# Patient Record
Sex: Male | Born: 1955 | Race: White | Hispanic: No | Marital: Single | Smoking: Current every day smoker
Health system: Southern US, Community
[De-identification: ages and names within clinical notes are randomized; demographics above are authoritative.]

## PROBLEM LIST (undated history)

## (undated) DIAGNOSIS — I428 Other cardiomyopathies: Secondary | ICD-10-CM

## (undated) DIAGNOSIS — N289 Disorder of kidney and ureter, unspecified: Secondary | ICD-10-CM

## (undated) DIAGNOSIS — E785 Hyperlipidemia, unspecified: Secondary | ICD-10-CM

## (undated) DIAGNOSIS — N1832 Chronic kidney disease, stage 3b: Secondary | ICD-10-CM

## (undated) DIAGNOSIS — J449 Chronic obstructive pulmonary disease, unspecified: Secondary | ICD-10-CM

## (undated) DIAGNOSIS — I4892 Unspecified atrial flutter: Secondary | ICD-10-CM

## (undated) DIAGNOSIS — K219 Gastro-esophageal reflux disease without esophagitis: Secondary | ICD-10-CM

## (undated) DIAGNOSIS — I10 Essential (primary) hypertension: Secondary | ICD-10-CM

## (undated) DIAGNOSIS — G4730 Sleep apnea, unspecified: Secondary | ICD-10-CM

## (undated) DIAGNOSIS — G4733 Obstructive sleep apnea (adult) (pediatric): Secondary | ICD-10-CM

## (undated) DIAGNOSIS — E119 Type 2 diabetes mellitus without complications: Secondary | ICD-10-CM

## (undated) DIAGNOSIS — I5030 Unspecified diastolic (congestive) heart failure: Secondary | ICD-10-CM

## (undated) DIAGNOSIS — I2510 Atherosclerotic heart disease of native coronary artery without angina pectoris: Secondary | ICD-10-CM

## (undated) DIAGNOSIS — I5042 Chronic combined systolic (congestive) and diastolic (congestive) heart failure: Secondary | ICD-10-CM

## (undated) DIAGNOSIS — N183 Chronic kidney disease, stage 3 unspecified: Secondary | ICD-10-CM

## (undated) DIAGNOSIS — T7840XA Allergy, unspecified, initial encounter: Secondary | ICD-10-CM

## (undated) HISTORY — DX: Gastro-esophageal reflux disease without esophagitis: K21.9

## (undated) HISTORY — DX: Chronic obstructive pulmonary disease, unspecified: J44.9

## (undated) HISTORY — PX: CHOLECYSTECTOMY: SHX55

## (undated) HISTORY — DX: Allergy, unspecified, initial encounter: T78.40XA

## (undated) HISTORY — DX: Sleep apnea, unspecified: G47.30

## (undated) HISTORY — PX: PARATHYROIDECTOMY: SHX19

## (undated) HISTORY — PX: BACK SURGERY: SHX140

## (undated) HISTORY — PX: CARDIAC CATHETERIZATION: SHX172

## (undated) HISTORY — DX: Atherosclerotic heart disease of native coronary artery without angina pectoris: I25.10

## (undated) HISTORY — DX: Type 2 diabetes mellitus without complications: E11.9

## (undated) HISTORY — PX: OTHER SURGICAL HISTORY: SHX169

## (undated) HISTORY — DX: Essential (primary) hypertension: I10

## (undated) HISTORY — DX: Hyperlipidemia, unspecified: E78.5

---

## 2004-03-13 ENCOUNTER — Emergency Department: Payer: Self-pay | Admitting: Emergency Medicine

## 2004-11-11 ENCOUNTER — Emergency Department: Payer: Self-pay | Admitting: Emergency Medicine

## 2004-11-21 ENCOUNTER — Emergency Department: Payer: Self-pay | Admitting: Emergency Medicine

## 2004-11-24 ENCOUNTER — Emergency Department: Payer: Self-pay | Admitting: General Practice

## 2004-11-25 ENCOUNTER — Emergency Department: Payer: Self-pay | Admitting: Unknown Physician Specialty

## 2006-06-24 IMAGING — CR RIGHT ELBOW - 2 VIEW
1 series · 2 of 2 positions shown · non-contrast
Comparison: none

REASON FOR EXAM: Rule out foreign body
COMMENTS:  LMP: (Male)

[Series 1: view not recorded · 0.17mm/px · 2 of 2 slices shown]
[im 1/2]
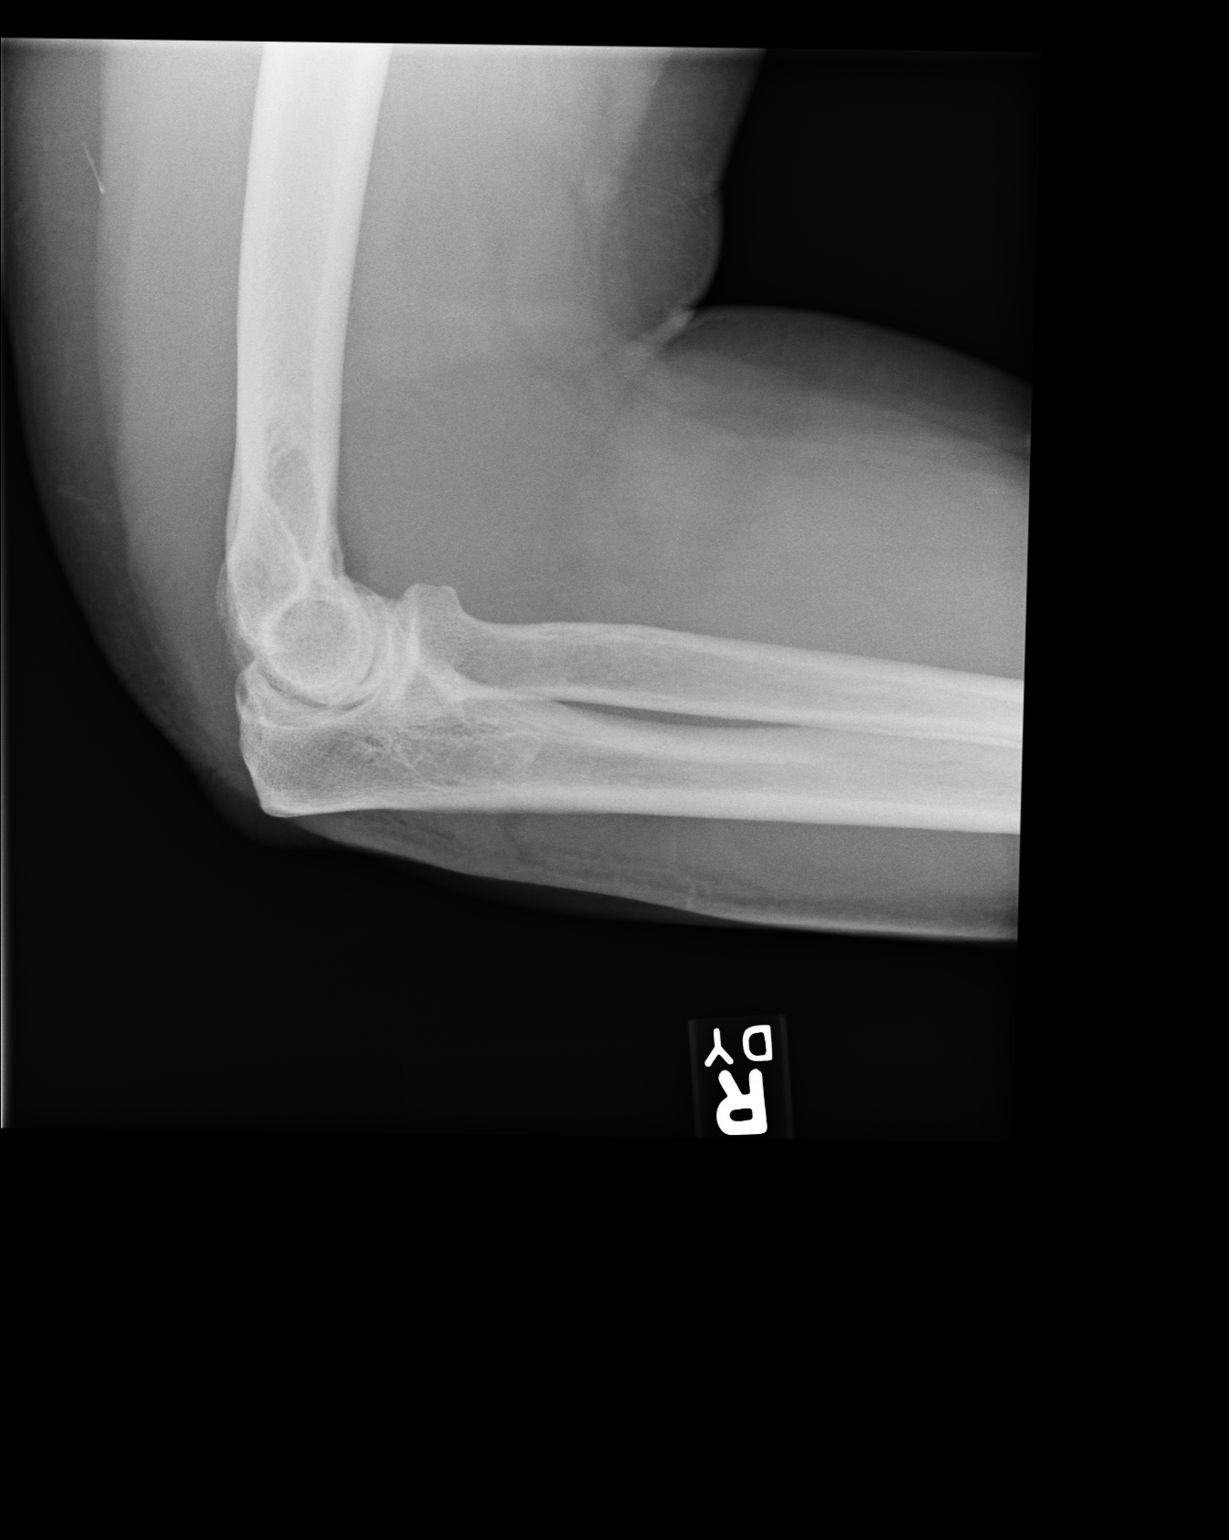
[im 2/2]
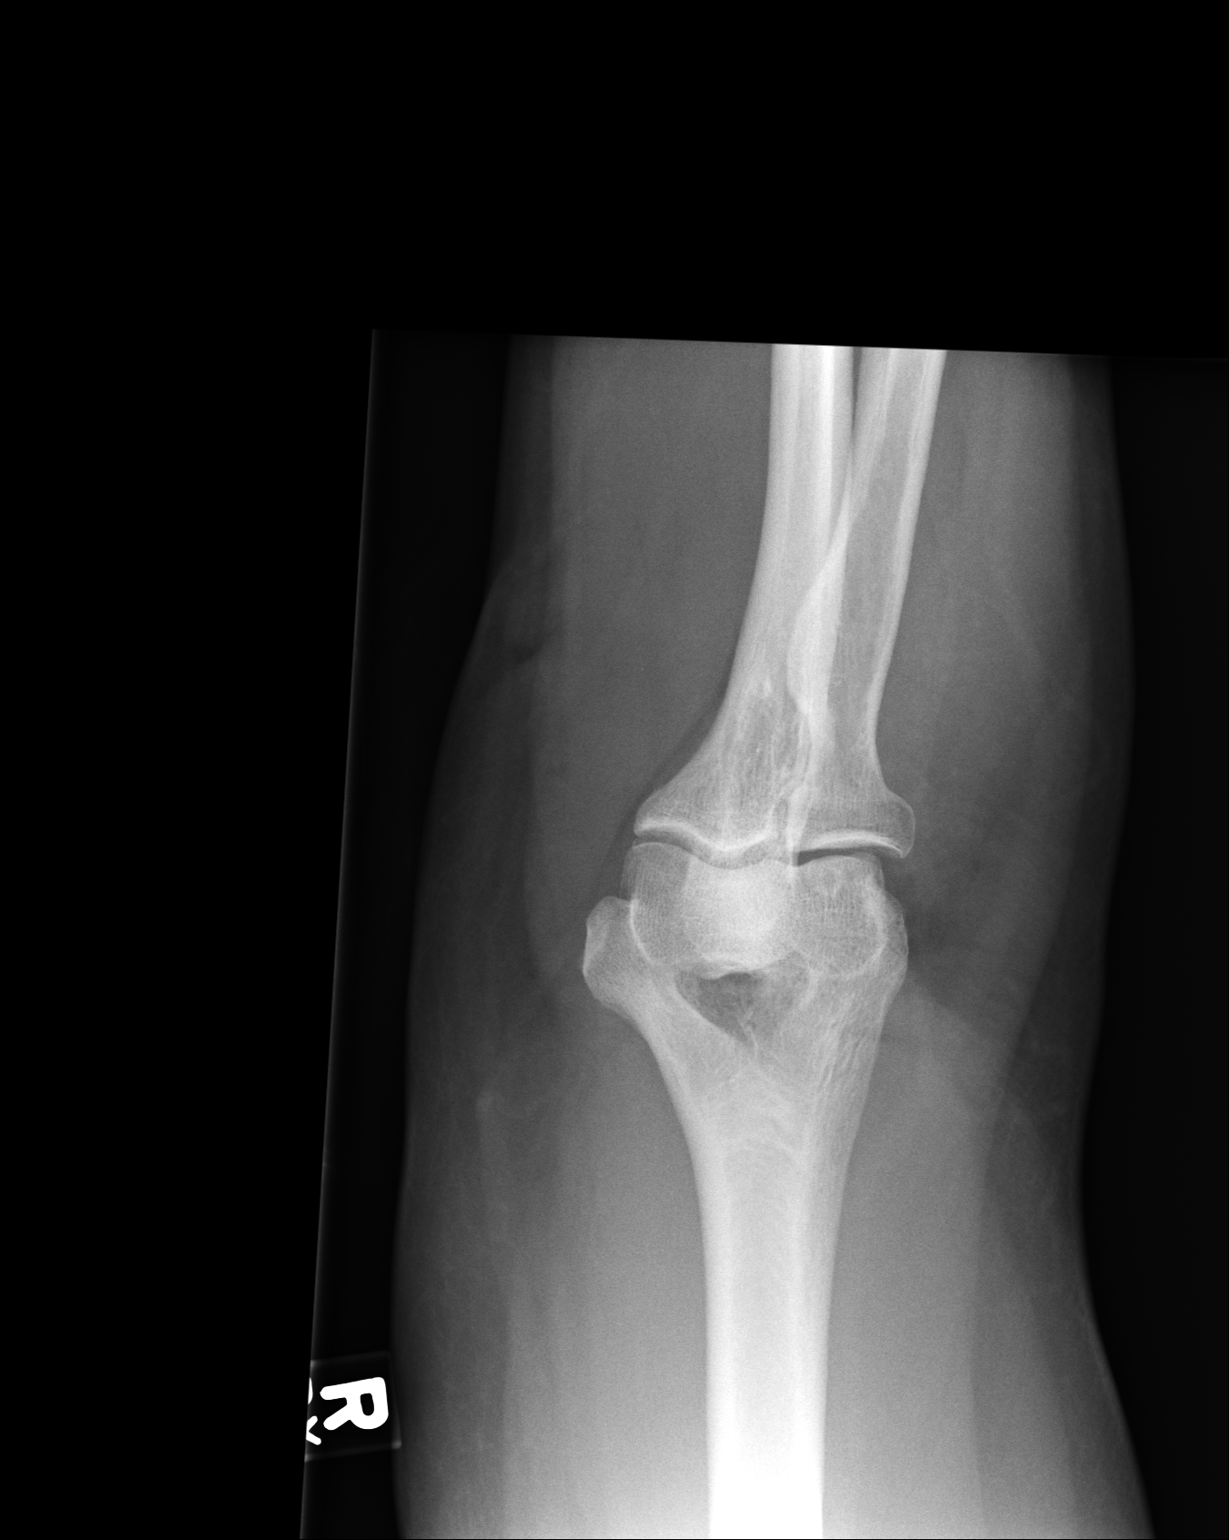

[2 of 2 positions shown; findings below may reference images not displayed]

PROCEDURE:     DXR - DXR ELBOW RT AP AND LATERAL  - November 11, 2004 [DATE]

RESULT:     The patient has sustained a puncture wound in the olecranon
region and is being evaluated for possible retained foreign body.

AP and lateral views of the elbow do not reveal evidence of a foreign body
in the region of the olecranon. No soft tissue gas is seen. There is a
linear density projecting over the soft tissues of the posterior distal
triceps region that is seen only on the lateral film but this is likely
remote from the area of injury. The bony structures are normal in appearance.
IMPRESSION: I do not see evidence of retained metallic foreign body over
the elbow itself.

## 2007-12-24 ENCOUNTER — Emergency Department: Payer: Self-pay | Admitting: Emergency Medicine

## 2008-01-25 ENCOUNTER — Emergency Department: Payer: Self-pay | Admitting: Emergency Medicine

## 2008-02-01 ENCOUNTER — Emergency Department: Payer: Self-pay | Admitting: Emergency Medicine

## 2008-07-15 ENCOUNTER — Ambulatory Visit: Payer: Self-pay | Admitting: Urology

## 2008-07-17 ENCOUNTER — Ambulatory Visit: Payer: Self-pay | Admitting: Urology

## 2009-10-31 ENCOUNTER — Inpatient Hospital Stay: Payer: Self-pay | Admitting: Internal Medicine

## 2010-02-22 ENCOUNTER — Emergency Department: Payer: Self-pay | Admitting: Emergency Medicine

## 2010-02-27 IMAGING — CR DG CHEST 2V
1 series · 2 of 2 positions shown · non-contrast
Comparison: none

REASON FOR EXAM: rcc s/p nephrectomy
COMMENTS:

PROCEDURE:     DXR - DXR CHEST PA (OR AP) AND LATERAL  - July 17, 2008 [DATE]
RESULT:     The lung fields are clear. No pneumonia, pneumothorax or pleural
effusion is seen. Heart size is normal.

[Series 1: view not recorded · 0.17mm/px · 2 of 2 slices shown]
[im 1/2]
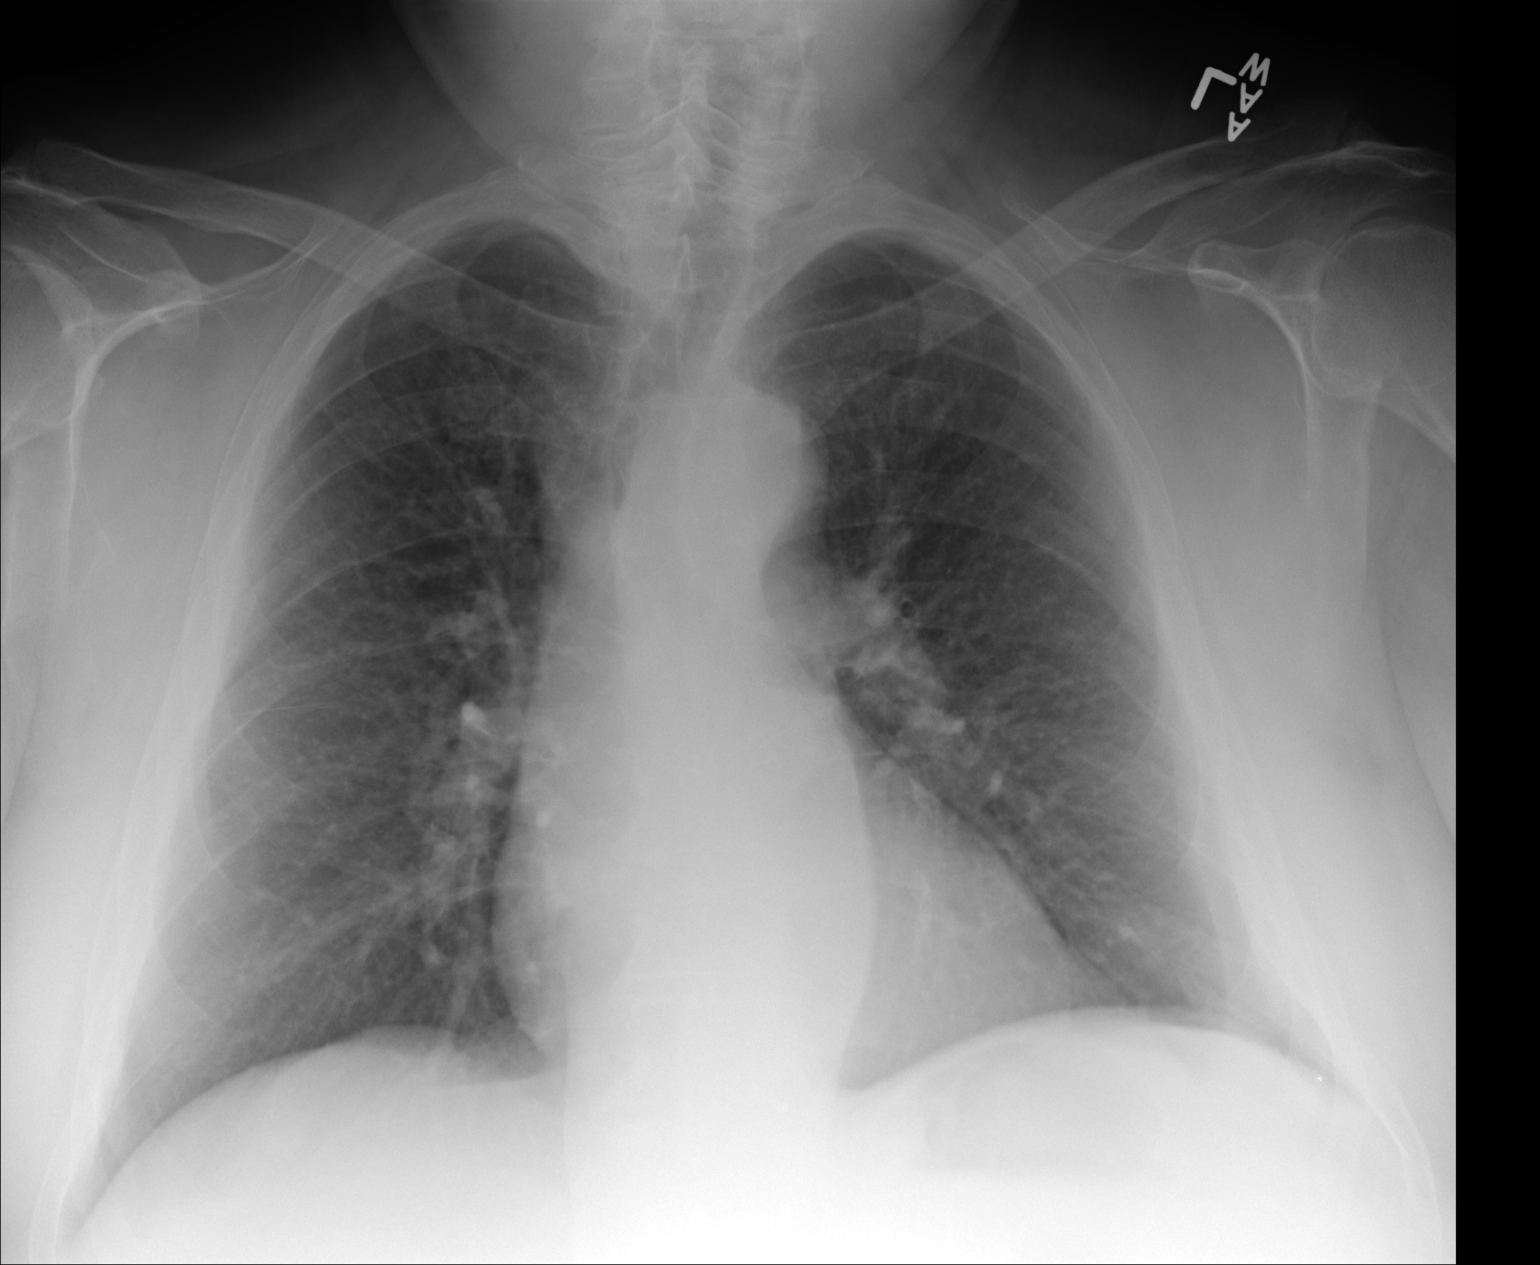
[im 2/2]
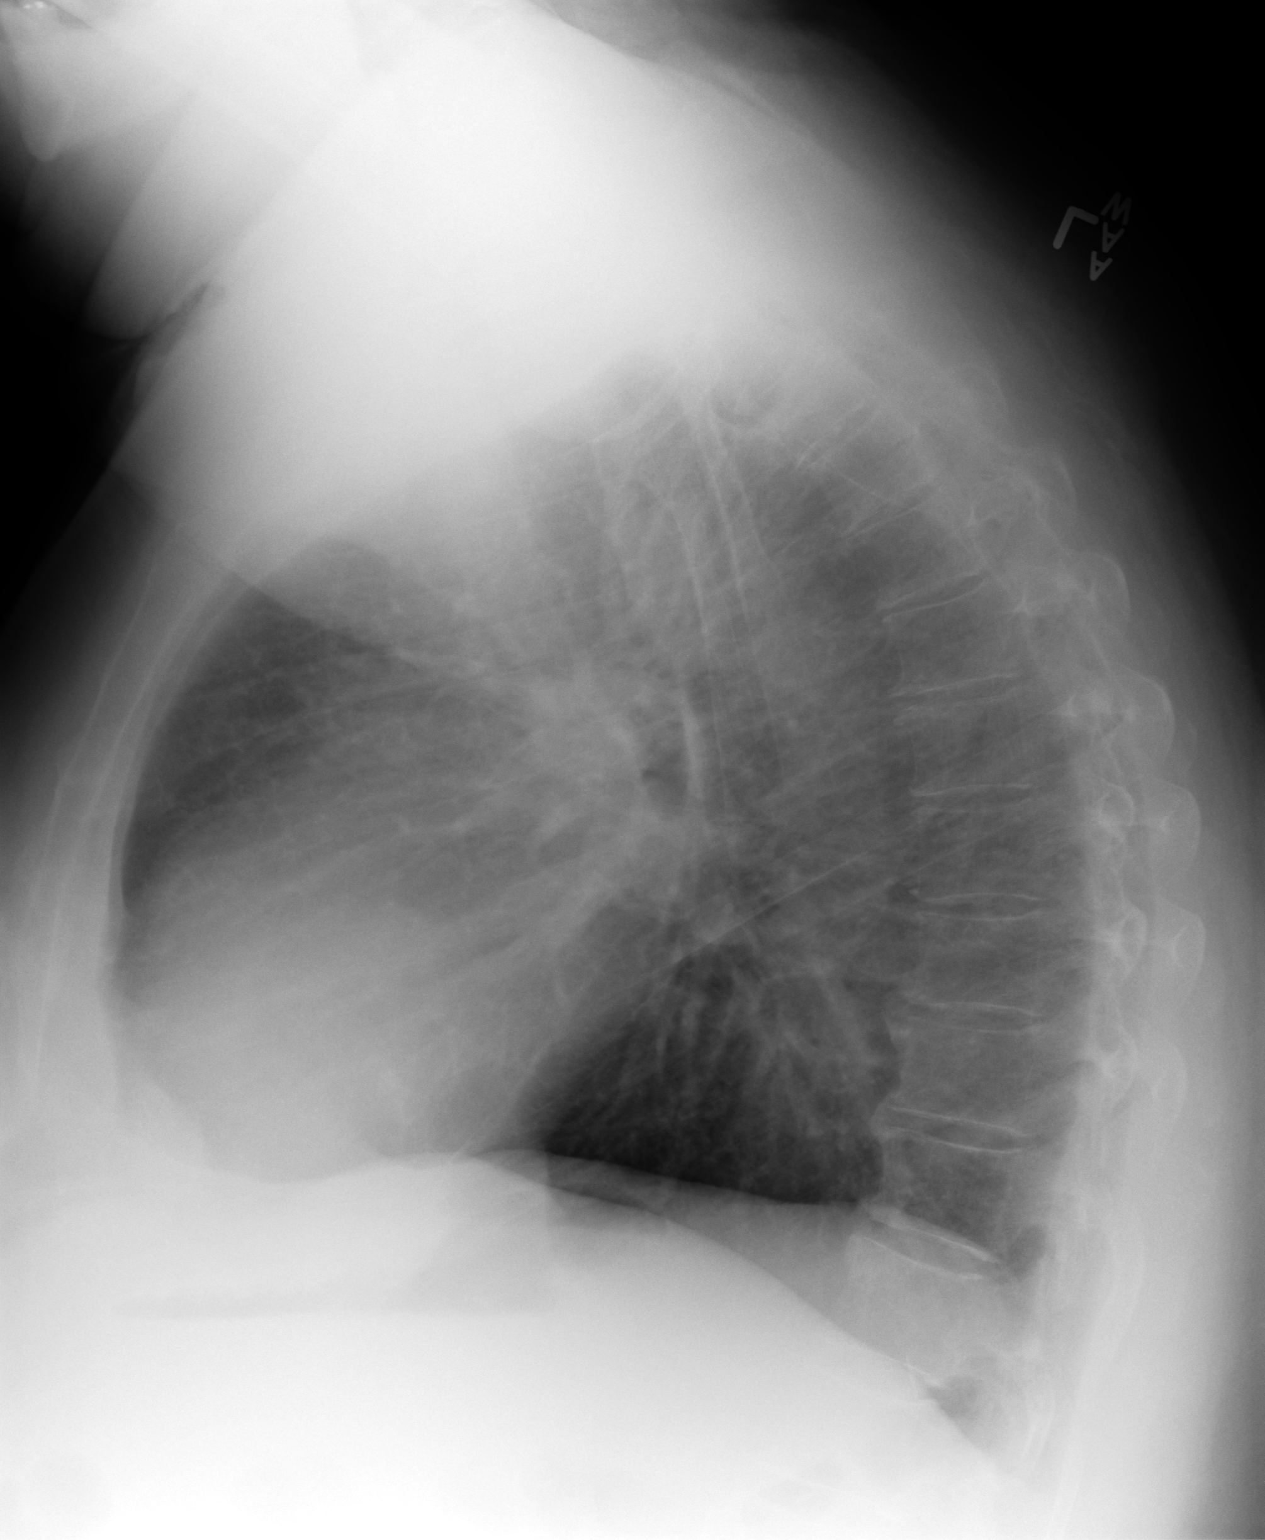

[2 of 2 positions shown; findings below may reference images not displayed]

IMPRESSION: 1.     No acute changes are identified.

## 2010-03-05 ENCOUNTER — Inpatient Hospital Stay: Payer: Self-pay | Admitting: Internal Medicine

## 2010-04-12 ENCOUNTER — Ambulatory Visit: Payer: Self-pay | Admitting: Otolaryngology

## 2010-04-16 ENCOUNTER — Other Ambulatory Visit: Payer: Self-pay | Admitting: Otolaryngology

## 2010-04-27 ENCOUNTER — Ambulatory Visit: Payer: Self-pay | Admitting: Otolaryngology

## 2010-05-03 ENCOUNTER — Ambulatory Visit: Payer: Self-pay | Admitting: Otolaryngology

## 2010-05-04 LAB — PATHOLOGY REPORT

## 2010-05-06 ENCOUNTER — Emergency Department: Payer: Self-pay | Admitting: Emergency Medicine

## 2010-06-26 ENCOUNTER — Emergency Department: Payer: Self-pay | Admitting: Unknown Physician Specialty

## 2010-09-30 ENCOUNTER — Emergency Department: Payer: Self-pay | Admitting: Emergency Medicine

## 2010-10-03 ENCOUNTER — Emergency Department: Payer: Self-pay | Admitting: Emergency Medicine

## 2010-10-22 ENCOUNTER — Emergency Department: Payer: Self-pay | Admitting: Emergency Medicine

## 2011-06-13 IMAGING — CT CT HEAD WITHOUT CONTRAST
2 series · 16 of 30 positions shown, 20 images · non-contrast
Comparison: none

REASON FOR EXAM: numbess on l
COMMENTS:

PROCEDURE:     CT  - CT HEAD WITHOUT CONTRAST  - October 31, 2009  [DATE]
RESULT:     Comparison:  None
TECHNIQUE: Multiple axial images from the foramen magnum to the vertex were
obtained without IV contrast.

[Series 2: without · axial · non-contrast · 0.41mm/px · z∈[-624,-500]mm · 13 of 31 slices shown, 17 images]
[im 3/31  brain]
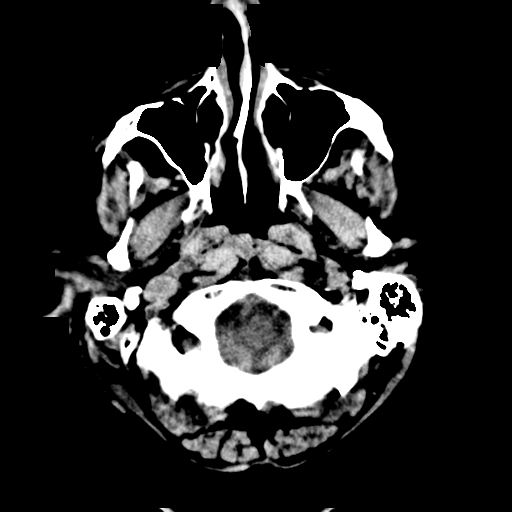
[im 3/31  bone]
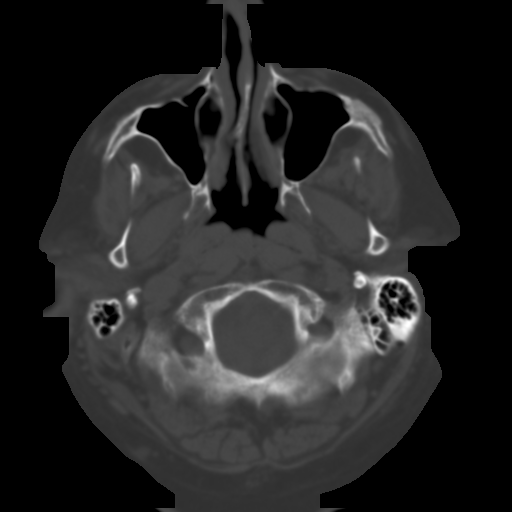
[im 5/31  brain]
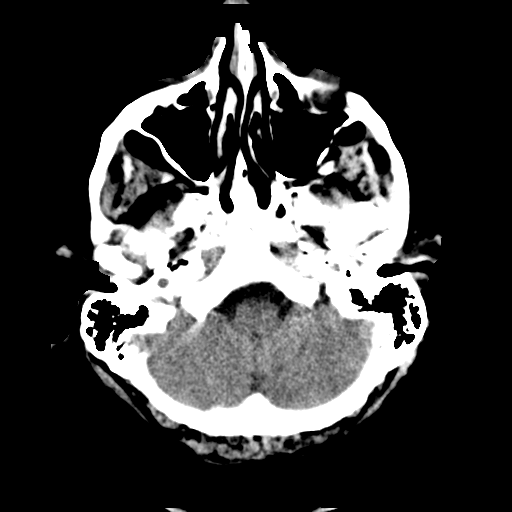
[im 7/31  brain]
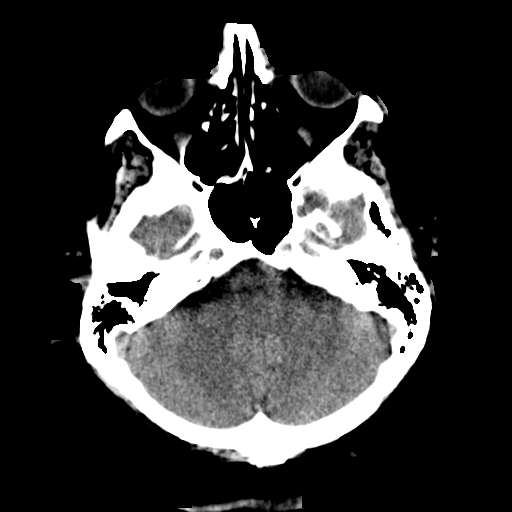
[im 9/31  brain]
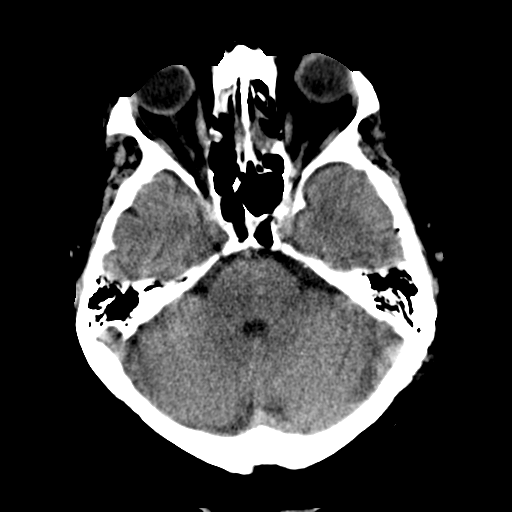
[im 11/31  brain]
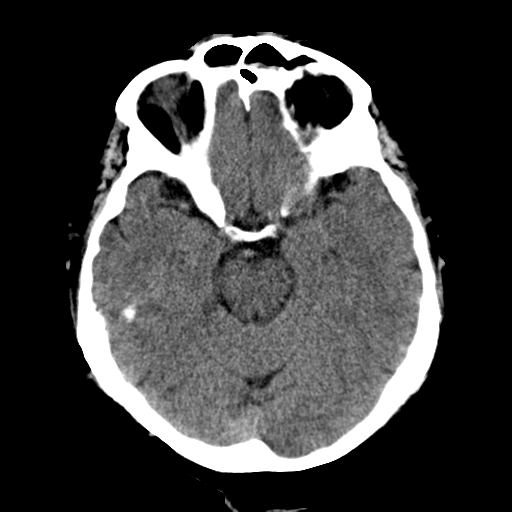
[im 11/31  bone]
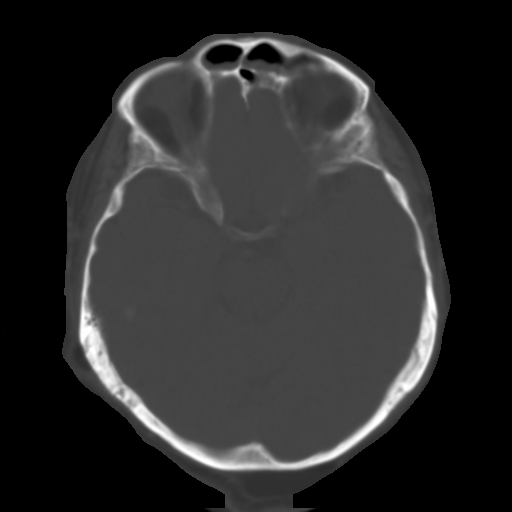
[im 13/31  brain]
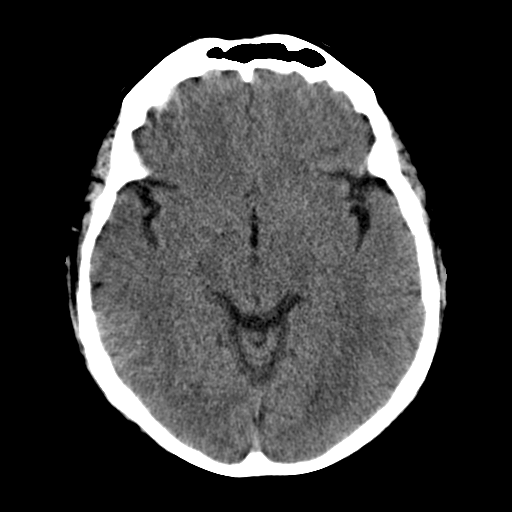
[im 16/31  brain]
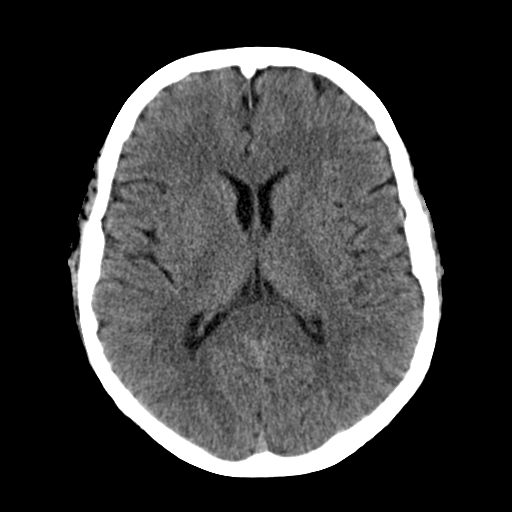
[im 18/31  brain]
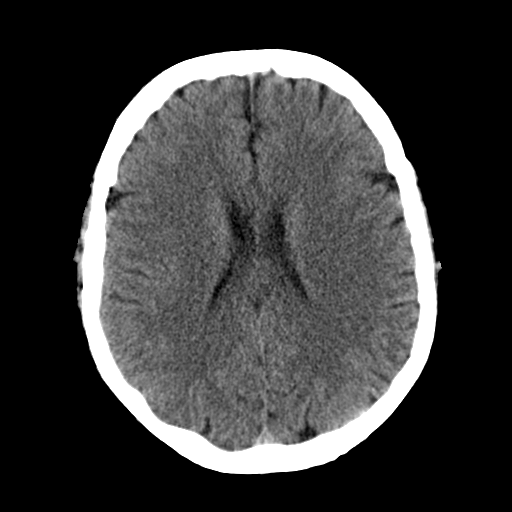
[im 20/31  brain]
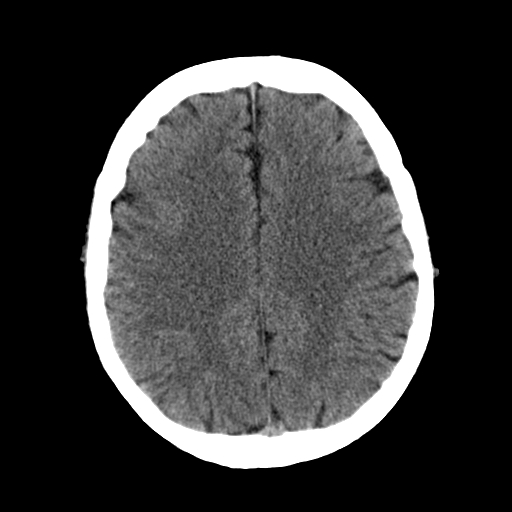
[im 20/31  bone]
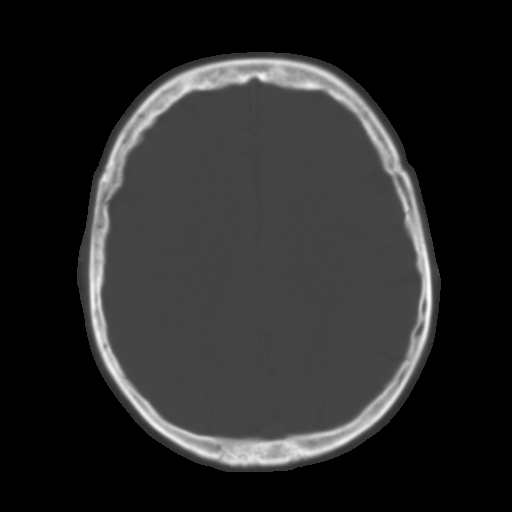
[im 22/31  brain]
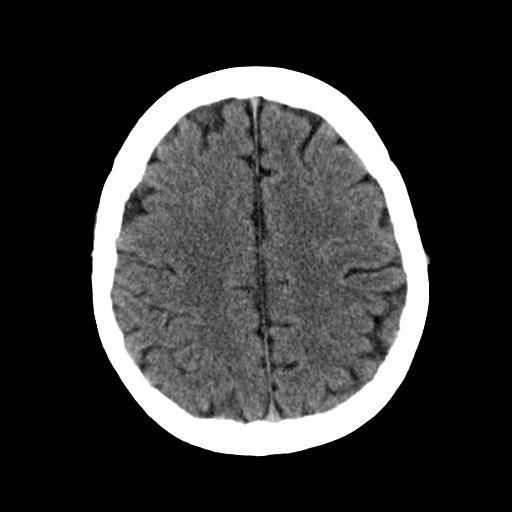
[im 24/31  brain]
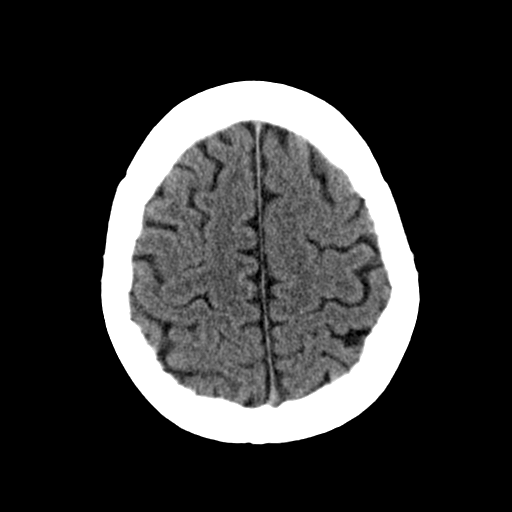
[im 26/31  brain]
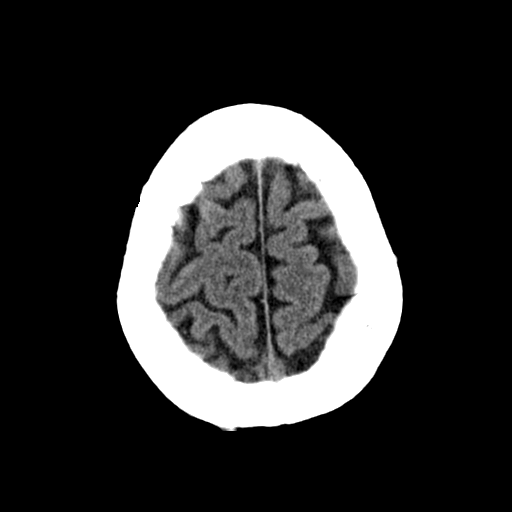
[im 28/31  brain]
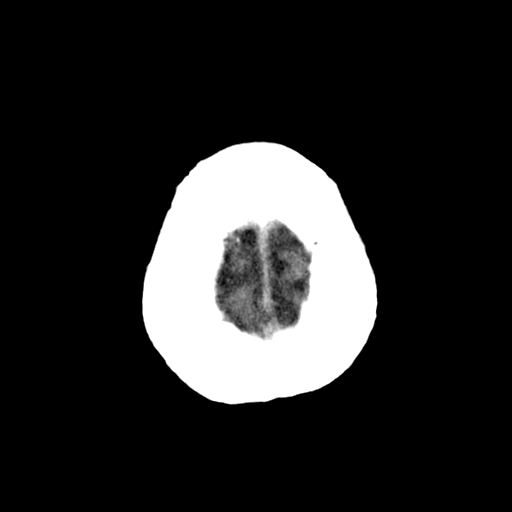
[im 28/31  bone]
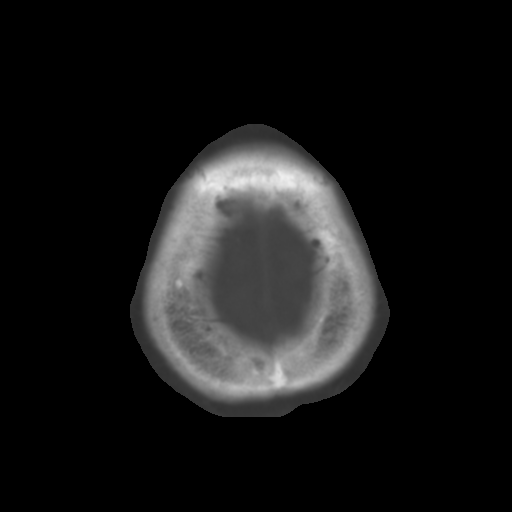

[Series 3: bone · axial · 0.41mm/px · z∈[-624,-584]mm · 3 of 31 slices shown]
[im 3/31  bone]
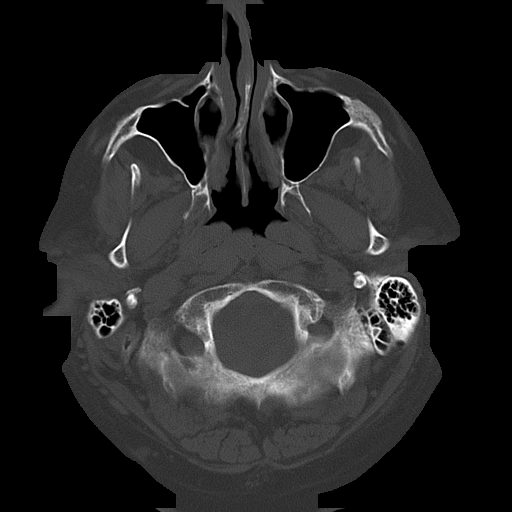
[im 7/31  bone]
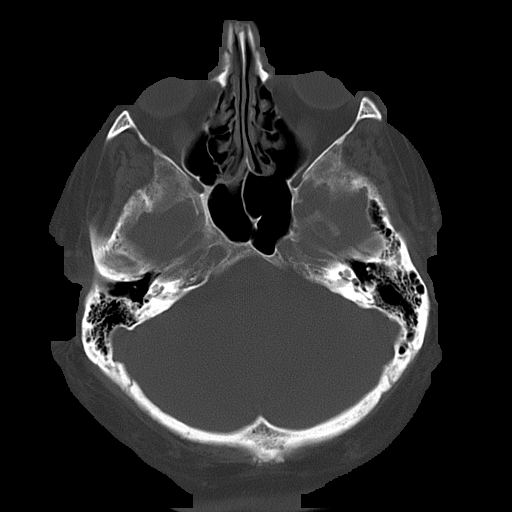
[im 11/31  bone]
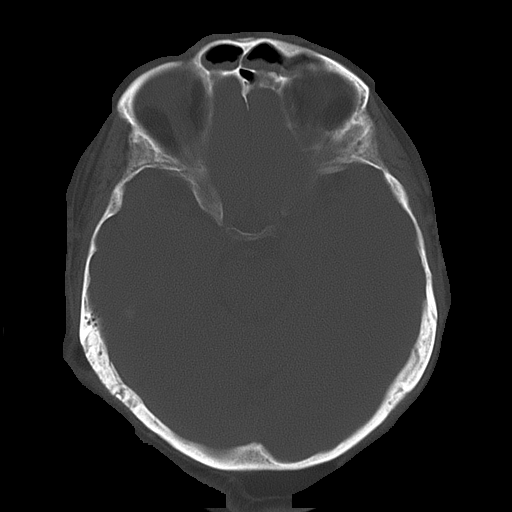

[16 of 30 positions shown; findings below may reference images not displayed]

FINDINGS: There is no evidence of mass effect, midline shift, or extra-axial fluid
collections.  There is no evidence of a space-occupying lesion or
intracranial hemorrhage. There is no evidence of a cortical-based area of
acute infarction.

The ventricles and sulci are appropriate for the patient's age. The basal
cisterns are patent.

Visualized portions of the orbits are unremarkable. The visualized portions
of the paranasal sinuses and mastoid air cells are unremarkable.

The osseous structures are unremarkable.
IMPRESSION: No acute intracranial process.

## 2011-06-14 IMAGING — MR MRI HEAD WITHOUT CONTRAST
5 of 7 series · 33 of 48 positions shown · non-contrast
Comparison: None

REASON FOR EXAM: CVA
COMMENTS:

PROCEDURE:     MR  - MR BRAIN WO CONTRAST  - November 01, 2009 [DATE]
RESULT:     History: CVA, dizziness, migraines
TECHNIQUE: Multiplanar, multisequence MRI of the brain was obtained without
IV contrast.

[Series 2: T1 · axial · 10.0mm · 0.56mm/px · z∈[+0,+103]mm · 5 of 24 slices shown]
[im 1/24]
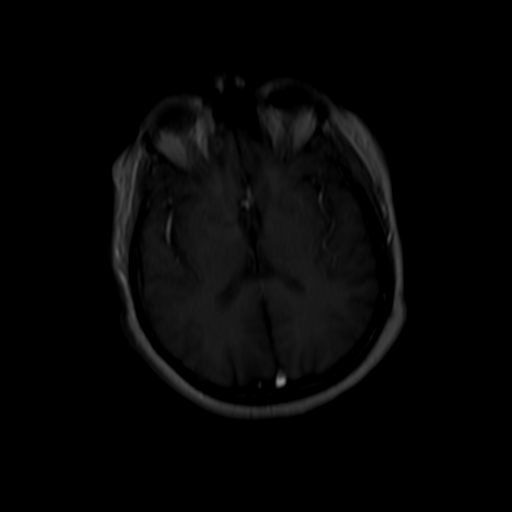
[im 5/24]
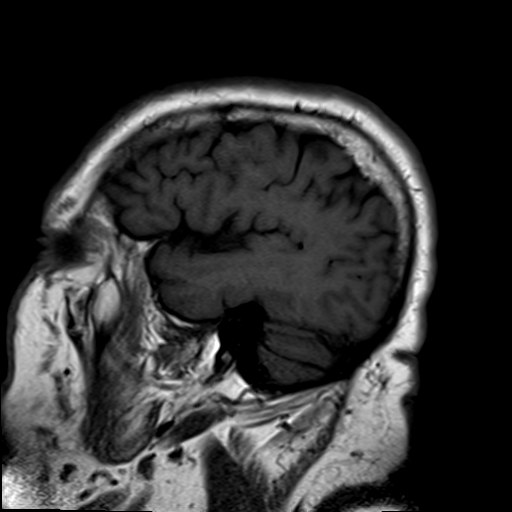
[im 10/24]
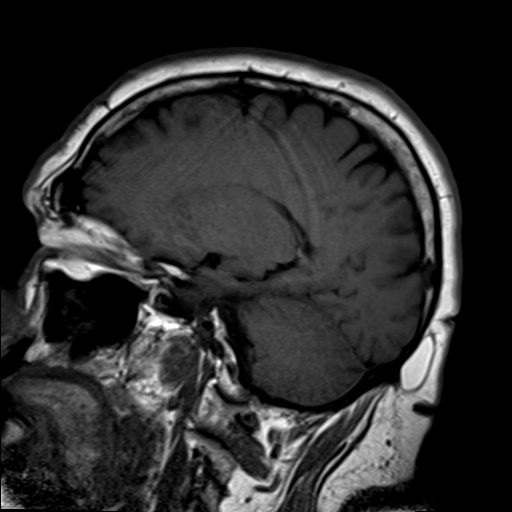
[im 14/24]
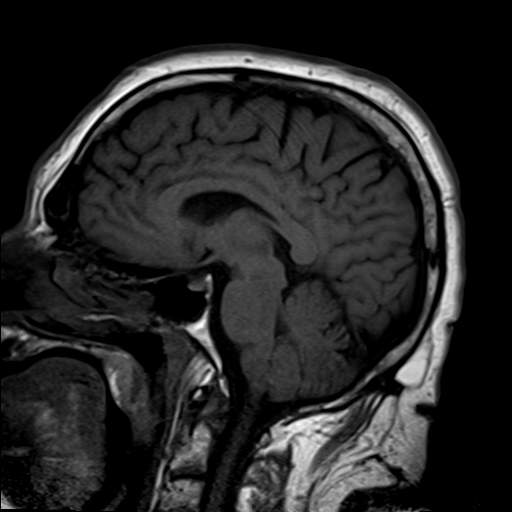
[im 19/24]
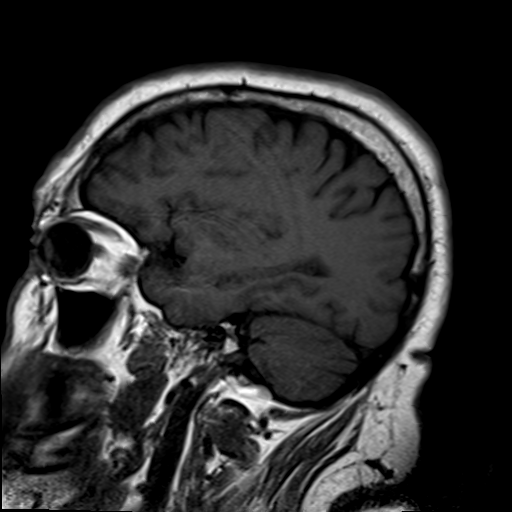

[Series 6: T2 · axial · 5.0mm · 0.60mm/px · z∈[-28,+149]mm · 7 of 24 slices shown]
[im 1/24]
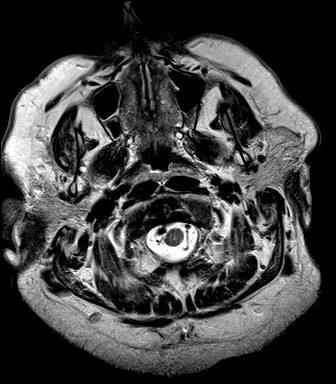
[im 4/24]
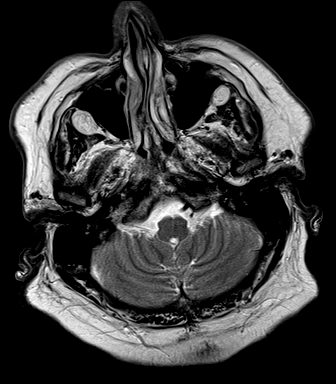
[im 8/24]
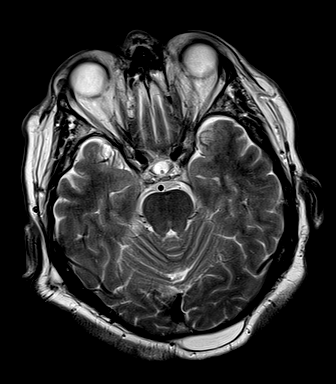
[im 12/24]
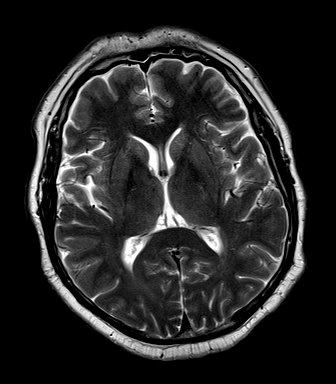
[im 16/24]
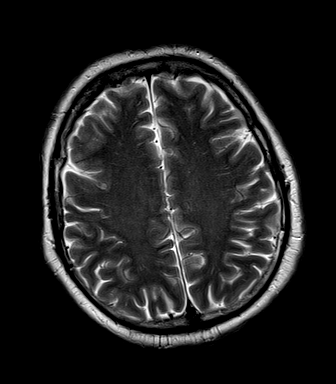
[im 20/24]
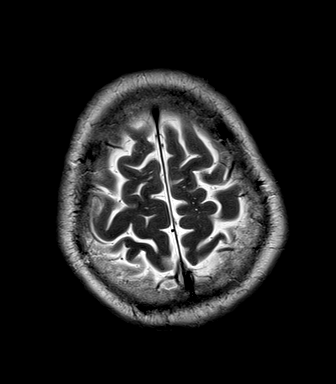
[im 24/24]
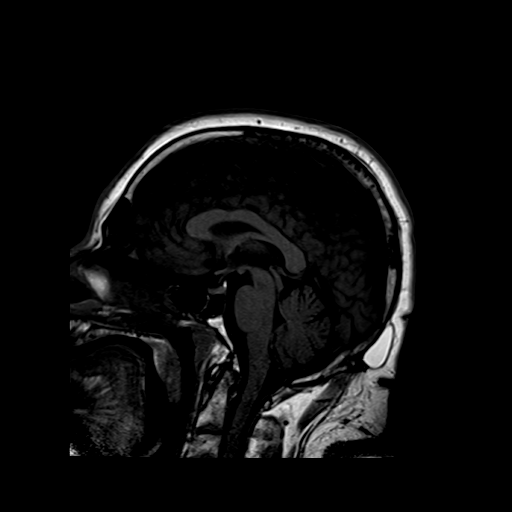

[Series 7: FLAIR · axial · 5.0mm · 0.45mm/px · z∈[-27,+149]mm · 7 of 24 slices shown]
[im 1/24]
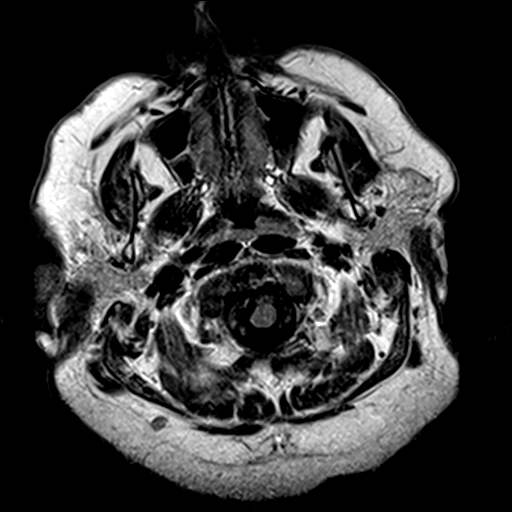
[im 4/24]
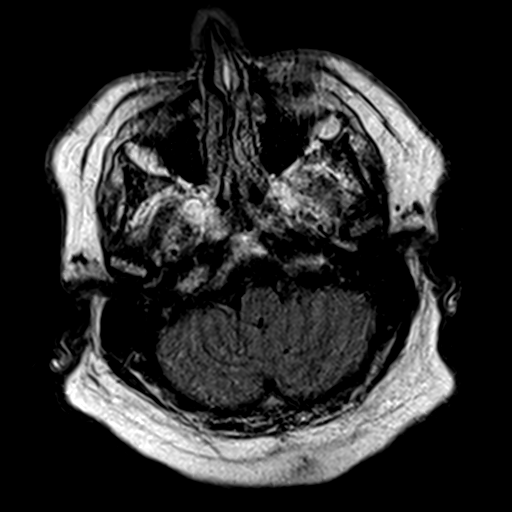
[im 8/24]
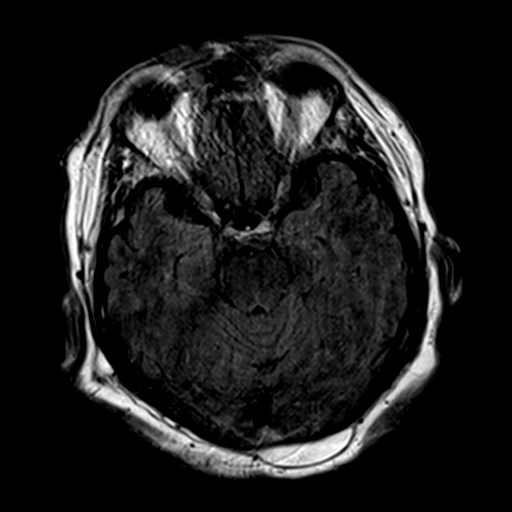
[im 12/24]
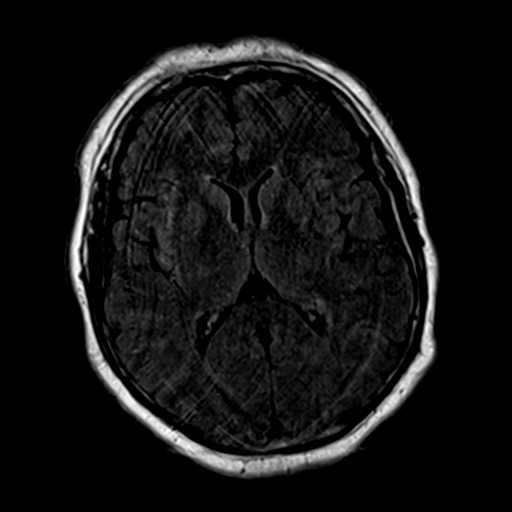
[im 16/24]
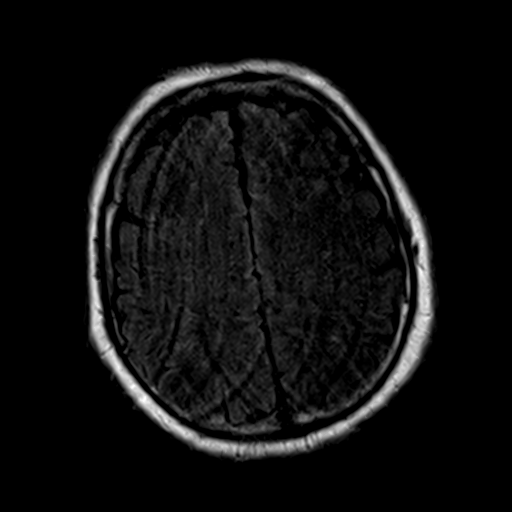
[im 20/24]
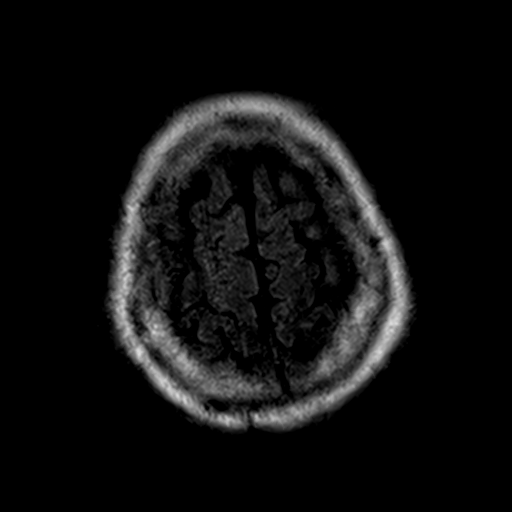
[im 24/24]
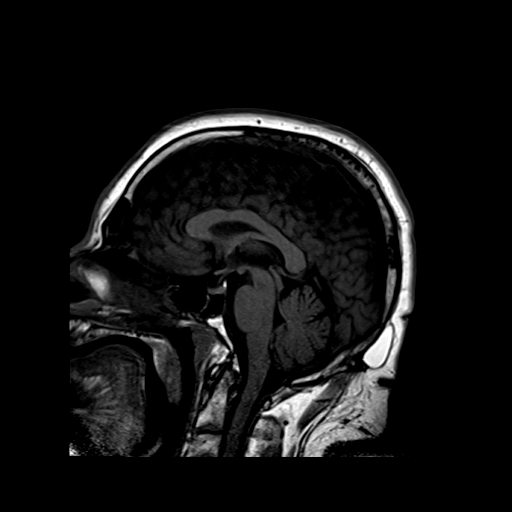

[Series 5002: DWI · axial · 5.0mm · 1.80mm/px · z∈[-27,+149]mm · 7 of 24 slices shown]
[im 1/24]
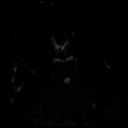
[im 4/24]
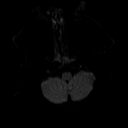
[im 8/24]
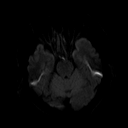
[im 12/24]
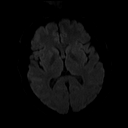
[im 16/24]
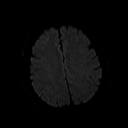
[im 20/24]
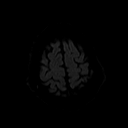
[im 24/24]
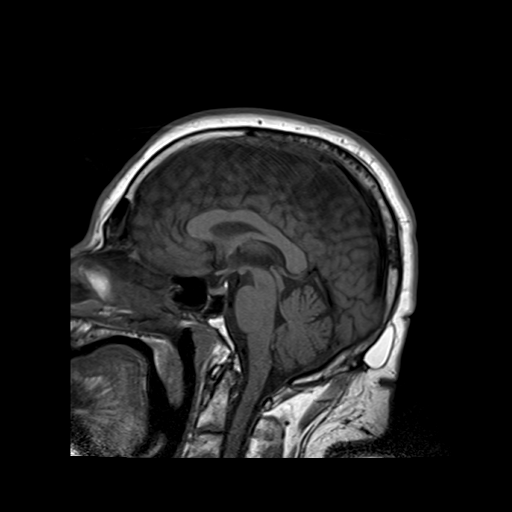

[Series 5003: ADC · axial · 5.0mm · 1.80mm/px · z∈[-27,+149]mm · 7 of 24 slices shown]
[im 1/24]
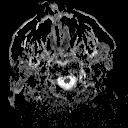
[im 4/24]
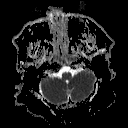
[im 8/24]
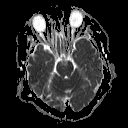
[im 12/24]
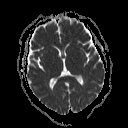
[im 16/24]
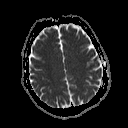
[im 20/24]
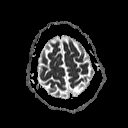
[im 24/24]
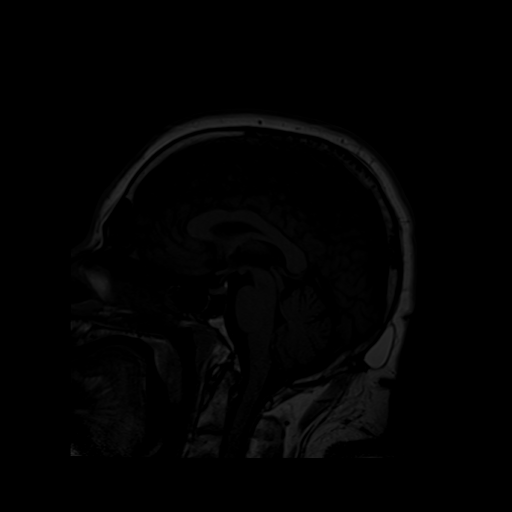

[33 of 48 positions shown; findings below may reference images not displayed]

FINDINGS: Examination is somewhat limited secondary to patient motion degrading image
quality.

There is no acute infarct. There is no hemorrhage. There is no pathologic
extra-axial fluid collection. There is no hydrocephalus. The ventricles are
normal for age. The basal cisterns are patent. There are no abnormal
extra-axial fluid collections.

There is mild bilateral ethmoid sinus mucosal thickening. There is bilateral
frontal sinus because of thickening. The mastoid sinuses are clear. The
skull base and calvarium demonstrate normal signal. The major intracranial
flow voids, including dural venous sinuses appear patent.
IMPRESSION: No acute intracranial pathology.

## 2011-08-10 ENCOUNTER — Emergency Department: Payer: Self-pay | Admitting: Emergency Medicine

## 2011-08-10 LAB — TROPONIN I
Troponin-I: <0.02 ng/mL
Troponin-I: <0.02 ng/mL

## 2011-08-10 LAB — CBC
HCT: 42.2 % (ref 40.0–52.0)
HGB: 14.6 g/dL (ref 13.0–18.0)
MCH: 30.8 pg (ref 26.0–34.0)
MCHC: 34.6 g/dL (ref 32.0–36.0)
MCV: 89 fL (ref 80–100)
Platelet: 135 10*3/uL — ABNORMAL LOW (ref 150–440)
RDW: 12.8 % (ref 11.5–14.5)
WBC: 4.8 10*3/uL (ref 3.8–10.6)

## 2011-08-10 LAB — PROTIME-INR: INR: 0.9

## 2011-08-10 LAB — COMPREHENSIVE METABOLIC PANEL
Alkaline Phosphatase: 63 U/L (ref 50–136)
Anion Gap: 15 (ref 7–16)
BUN: 15 mg/dL (ref 7–18)
Chloride: 101 mmol/L (ref 98–107)
Co2: 19 mmol/L — ABNORMAL LOW (ref 21–32)
EGFR (African American): >60
EGFR (Non-African Amer.): >60
SGOT(AST): 35 U/L (ref 15–37)
SGPT (ALT): 35 U/L
Sodium: 135 mmol/L — ABNORMAL LOW (ref 136–145)

## 2011-08-10 LAB — CK TOTAL AND CKMB (NOT AT ARMC)
CK, Total: 239 U/L — ABNORMAL HIGH (ref 35–232)
CK, Total: 241 U/L — ABNORMAL HIGH (ref 35–232)
CK-MB: 1.4 ng/mL (ref 0.5–3.6)

## 2011-08-10 LAB — PRO B NATRIURETIC PEPTIDE: B-Type Natriuretic Peptide: 12 pg/mL (ref 0–125)

## 2011-08-10 LAB — APTT: Activated PTT: 33 secs (ref 23.6–35.9)

## 2011-09-04 ENCOUNTER — Inpatient Hospital Stay: Payer: Self-pay | Admitting: Surgery

## 2011-09-04 LAB — CBC
HGB: 14.3 g/dL (ref 13.0–18.0)
MCHC: 34.2 g/dL (ref 32.0–36.0)
MCV: 90 fL (ref 80–100)
Platelet: 160 10*3/uL (ref 150–440)
RBC: 4.65 10*6/uL (ref 4.40–5.90)
RDW: 13.5 % (ref 11.5–14.5)

## 2011-09-04 LAB — COMPREHENSIVE METABOLIC PANEL
Albumin: 3.3 g/dL — ABNORMAL LOW (ref 3.4–5.0)
Anion Gap: 13 (ref 7–16)
BUN: 21 mg/dL — ABNORMAL HIGH (ref 7–18)
Bilirubin,Total: 0.3 mg/dL (ref 0.2–1.0)
Chloride: 103 mmol/L (ref 98–107)
Co2: 24 mmol/L (ref 21–32)
EGFR (African American): >60
EGFR (Non-African Amer.): >60
Glucose: 386 mg/dL — ABNORMAL HIGH (ref 65–99)
Osmolality: 298 (ref 275–301)
Sodium: 140 mmol/L (ref 136–145)

## 2011-09-04 LAB — TROPONIN I: Troponin-I: <0.02 ng/mL

## 2011-09-04 LAB — PROTIME-INR: INR: 0.8

## 2011-09-05 LAB — CBC WITH DIFFERENTIAL/PLATELET
Basophil #: 0 10*3/uL (ref 0.0–0.1)
Basophil %: 0.5 %
HCT: 39 % — ABNORMAL LOW (ref 40.0–52.0)
Lymphocyte #: 1.6 10*3/uL (ref 1.0–3.6)
MCHC: 32.8 g/dL (ref 32.0–36.0)
Monocyte #: 0.5 10*3/uL (ref 0.0–0.7)
Neutrophil #: 7.2 10*3/uL — ABNORMAL HIGH (ref 1.4–6.5)
RBC: 4.35 10*6/uL — ABNORMAL LOW (ref 4.40–5.90)
RDW: 12.4 % (ref 11.5–14.5)
WBC: 9.6 10*3/uL (ref 3.8–10.6)

## 2011-09-05 LAB — BASIC METABOLIC PANEL
Anion Gap: 11 (ref 7–16)
BUN: 14 mg/dL (ref 7–18)
Calcium, Total: 8.7 mg/dL (ref 8.5–10.1)
Chloride: 101 mmol/L (ref 98–107)
Co2: 25 mmol/L (ref 21–32)
Creatinine: 1.05 mg/dL (ref 0.60–1.30)
EGFR (Non-African Amer.): >60
Sodium: 137 mmol/L (ref 136–145)

## 2011-09-05 LAB — HEMOGLOBIN A1C: Hemoglobin A1C: 11.1 % — ABNORMAL HIGH (ref 4.2–6.3)

## 2011-09-06 LAB — LIPID PANEL
Cholesterol: 148 mg/dL (ref 0–200)
HDL Cholesterol: 36 mg/dL — ABNORMAL LOW (ref 40–60)

## 2011-09-14 LAB — WOUND CULTURE

## 2011-10-05 IMAGING — US THYROID ULTRASOUND
1 series · 17 of 17 positions shown · non-contrast
Comparison: none

REASON FOR EXAM: right nuchal mass
COMMENTS:   LMP: (Male)

[Series 1: thyroid ultrasound · 17 of 17 slices shown]
[im 1/17]
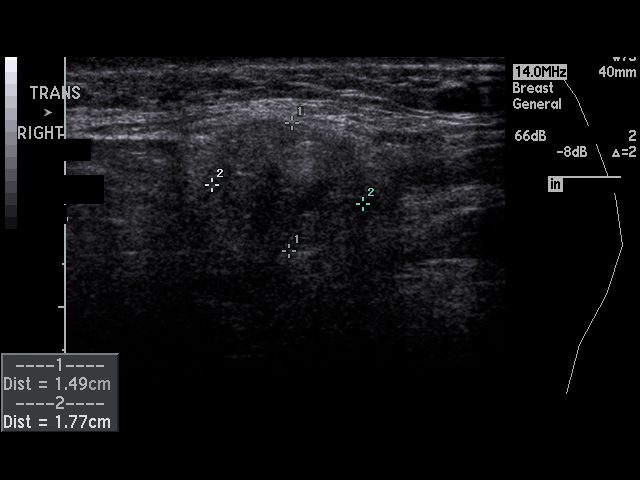
[im 2/17]
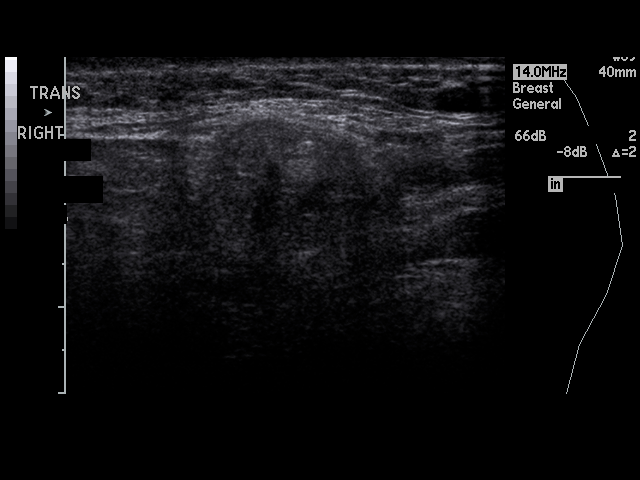
[im 3/17]
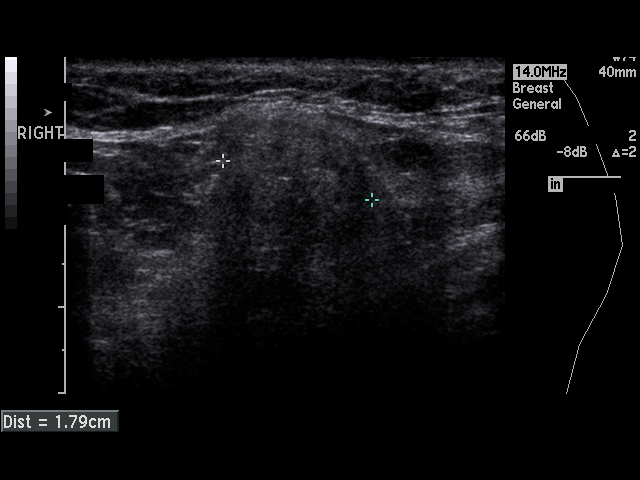
[im 4/17]
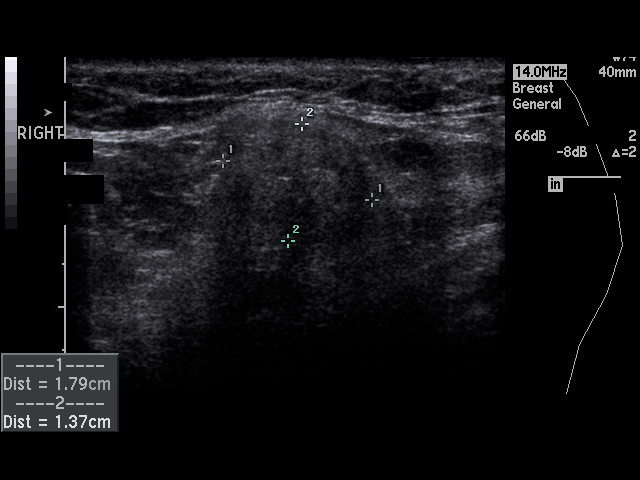
[im 5/17]
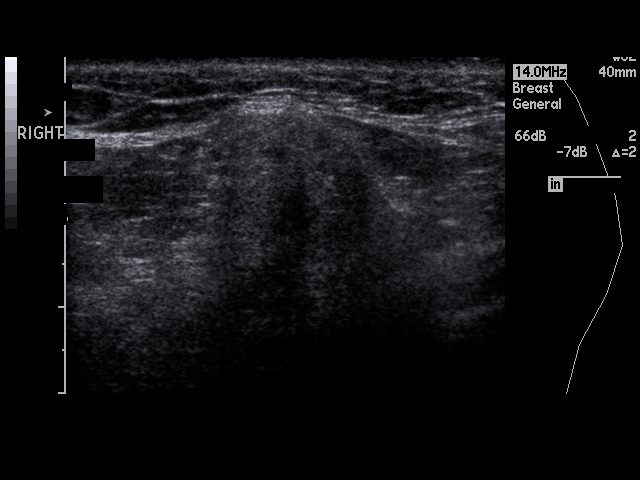
[im 6/17]
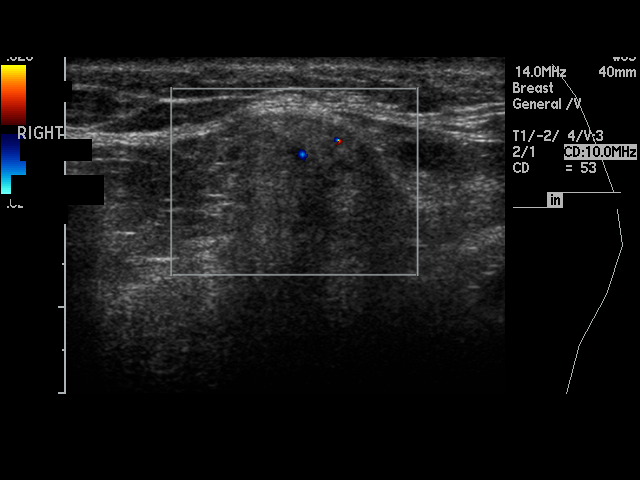
[im 7/17]
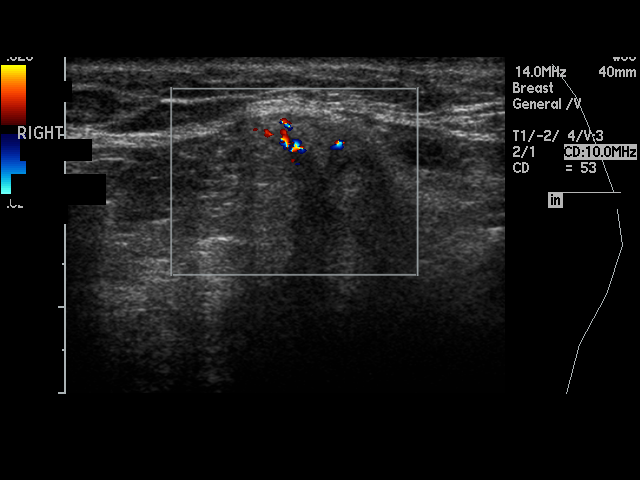
[im 8/17]
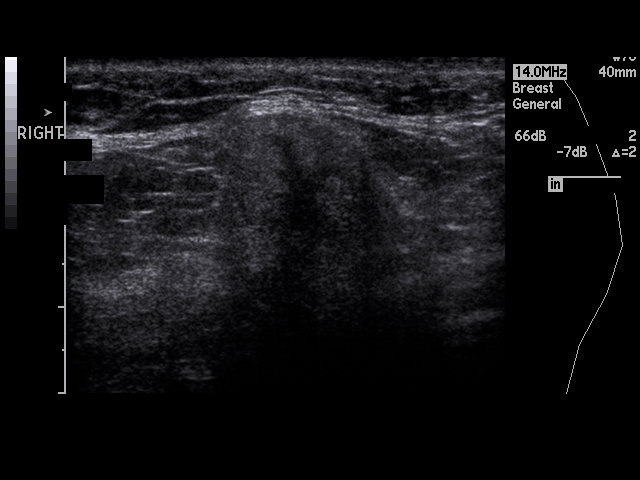
[im 9/17]
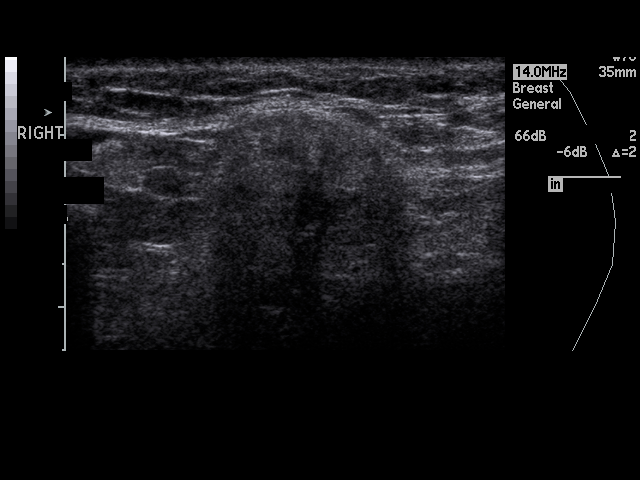
[im 10/17]
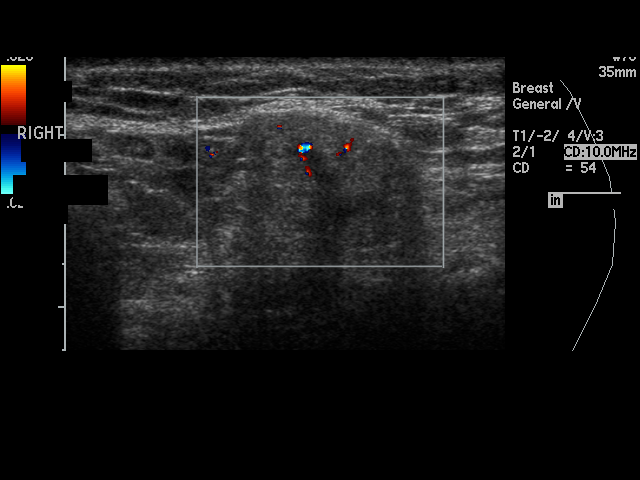
[im 11/17]
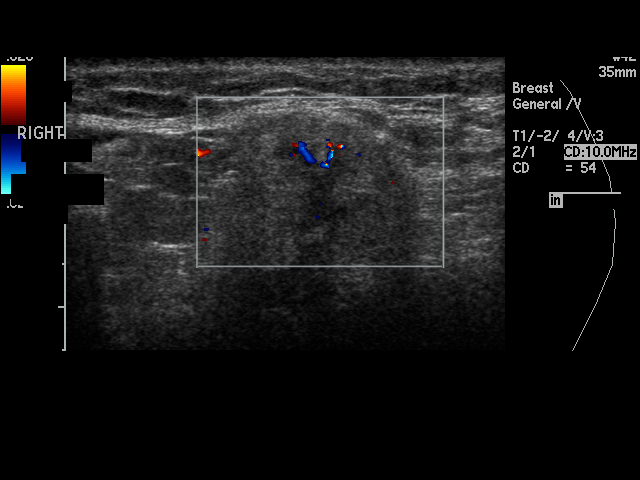
[im 12/17]
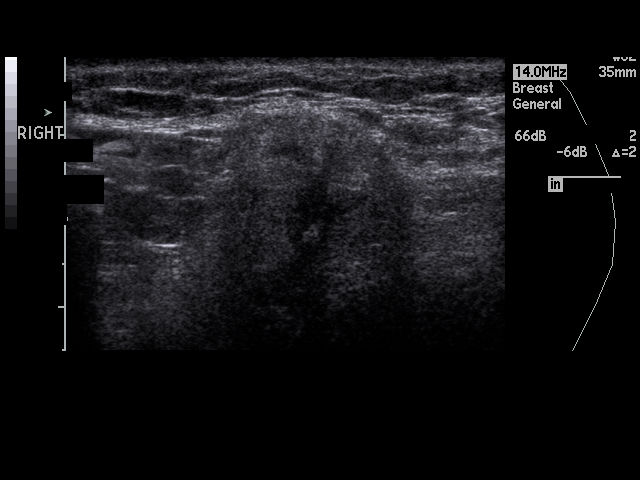
[im 13/17]
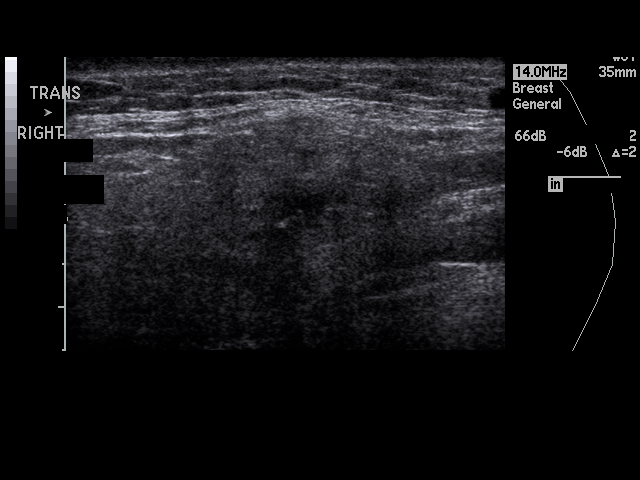
[im 14/17]
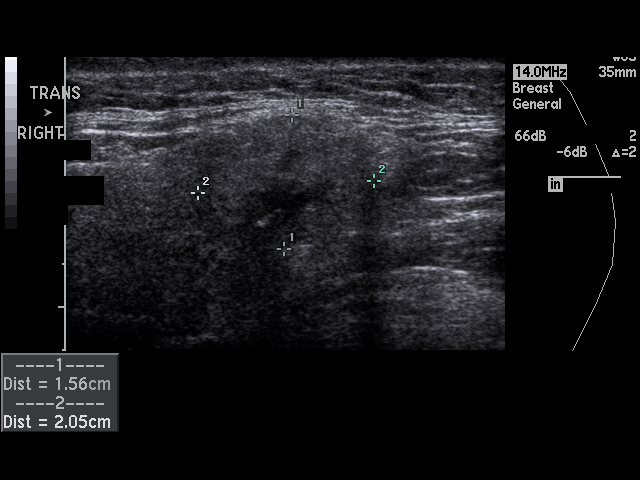
[im 15/17]
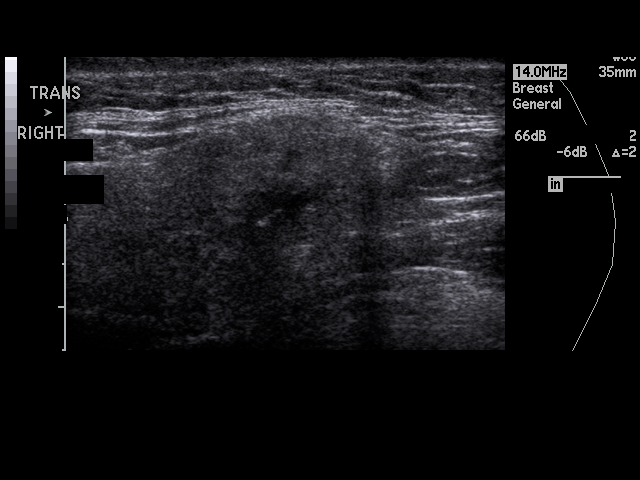
[im 16/17]
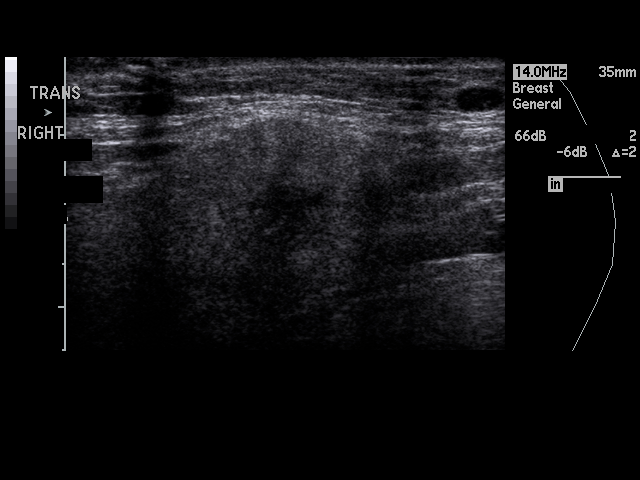
[im 17/17]
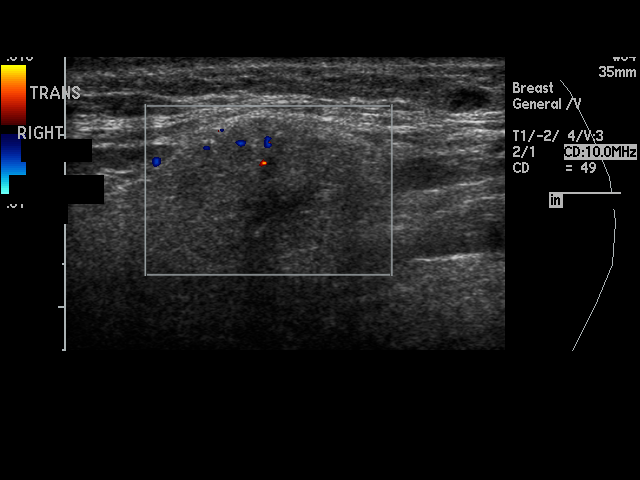

[17 of 17 positions shown; findings below may reference images not displayed]

PROCEDURE:     US  - US SOFT TISSUE HEAD/NECK  - February 22, 2010 [DATE]

RESULT:     Comparison: None.

Technique and Findings:
Multiple grayscale and color Doppler images were obtained in the region of
the right jawline at the area of palpable concern. In this region, there is
a heterogeneous mass. It measures 1.8 x 1.6 x 2.1 cm. There is some color
Doppler flow associated within the mass, suggesting vascularity.
IMPRESSION: Heterogeneous mass in the region of palpable concern along the right
jawline. This is nonspecific and differential includes benign and malignant
etiologies. Recommend close clinical followup to ensure resolution. If this
mass is persistent, consider evaluation with cross-sectional imaging.

## 2011-10-06 IMAGING — CT CT HEAD WITHOUT CONTRAST
2 series · 16 of 30 positions shown, 20 images · non-contrast
Comparison: none

REASON FOR EXAM: Headache, Vertigo, and Blurry vision
COMMENTS:   LMP: (Male)

PROCEDURE:     CT  - CT HEAD WITHOUT CONTRAST  - February 23, 2010  [DATE]
RESULT:     Comparison:  10/31/2009
TECHNIQUE: Multiple axial images from the foramen magnum to the vertex were
obtained without IV contrast.

[Series 2: without · axial · non-contrast · 0.45mm/px · z∈[-592,-462]mm · 13 of 32 slices shown, 17 images]
[im 3/32  brain]
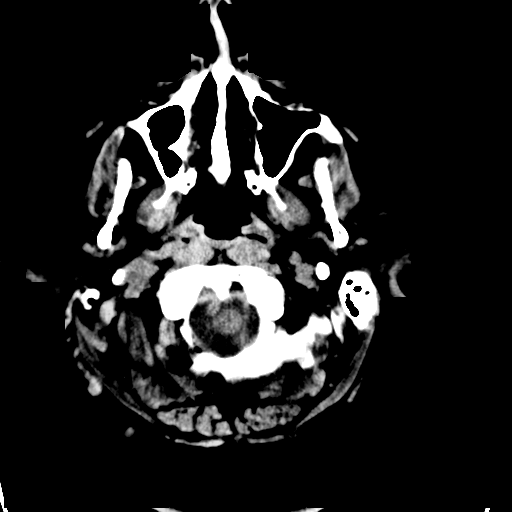
[im 3/32  bone]
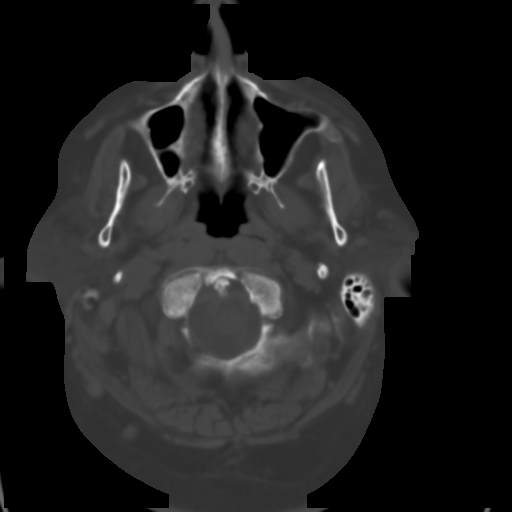
[im 5/32  brain]
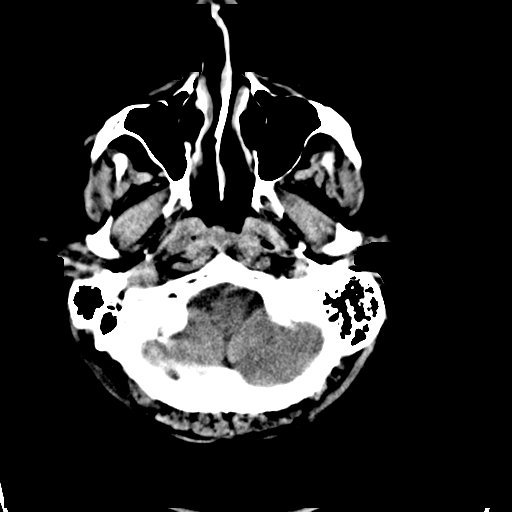
[im 7/32  brain]
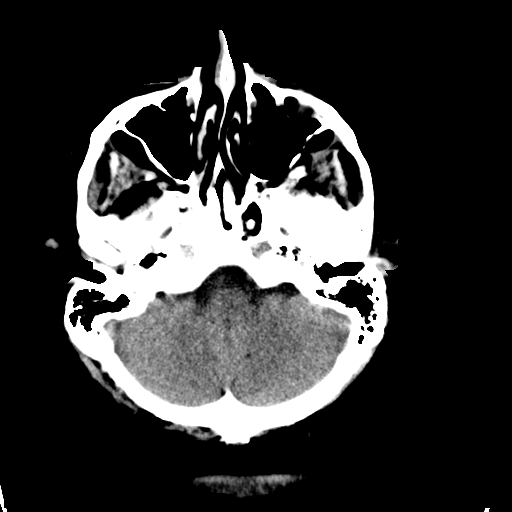
[im 9/32  brain]
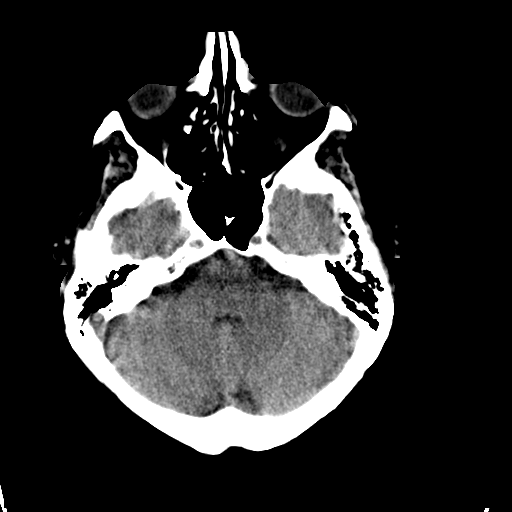
[im 12/32  brain]
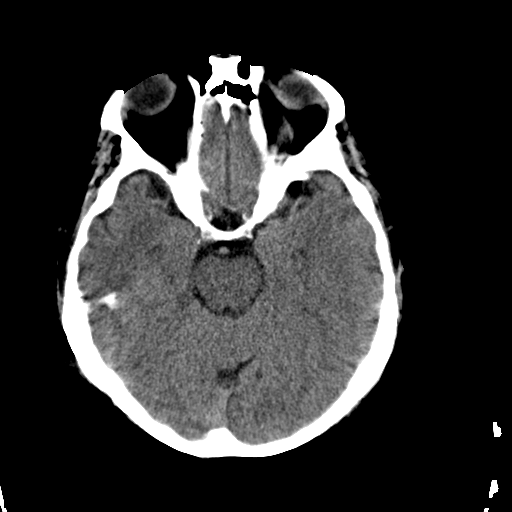
[im 12/32  bone]
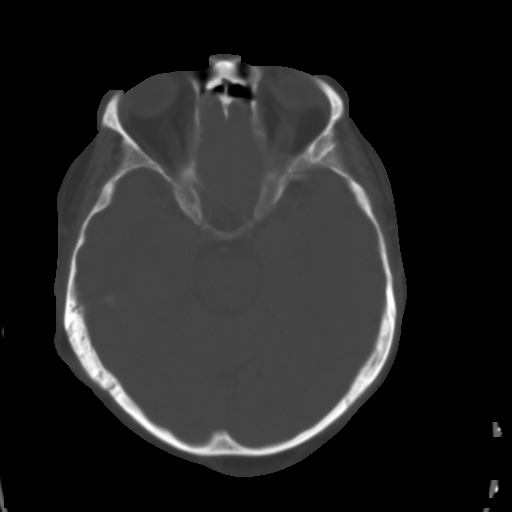
[im 14/32  brain]
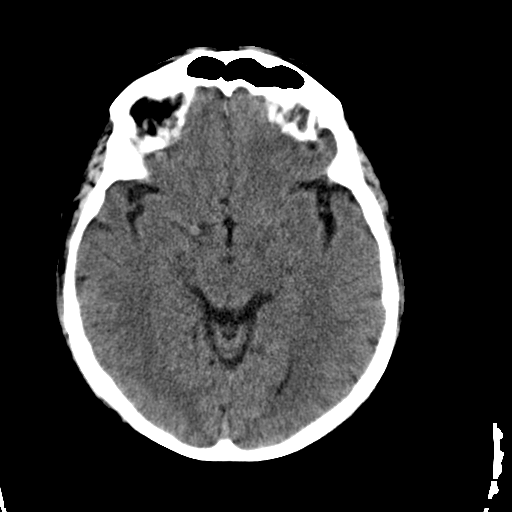
[im 16/32  brain]
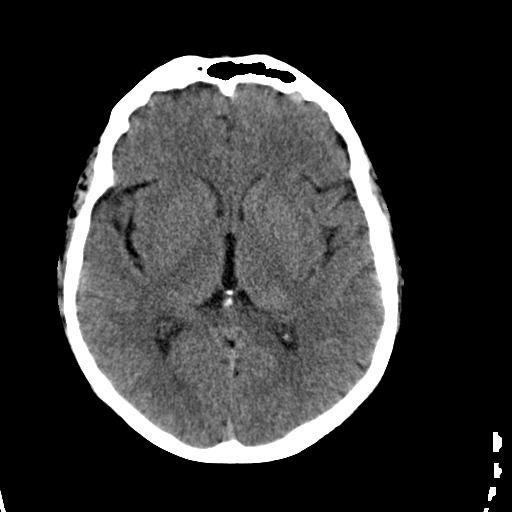
[im 18/32  brain]
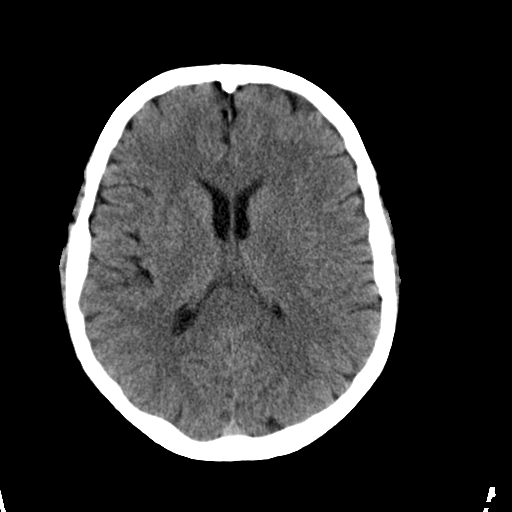
[im 20/32  brain]
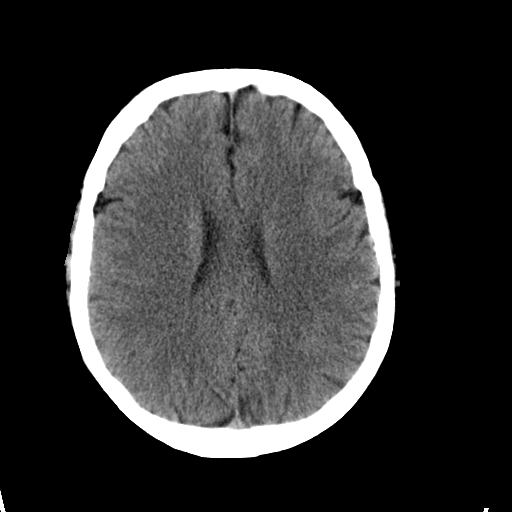
[im 20/32  bone]
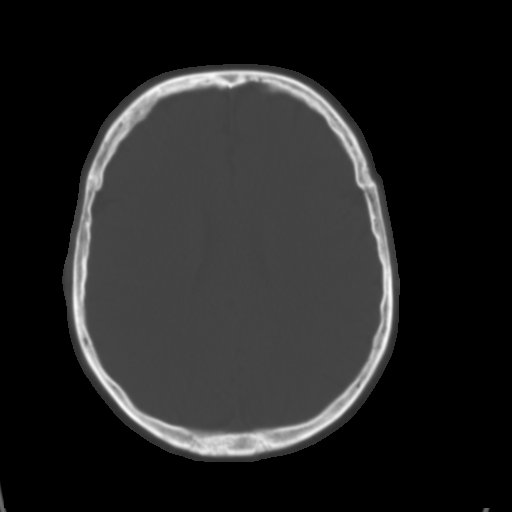
[im 23/32  brain]
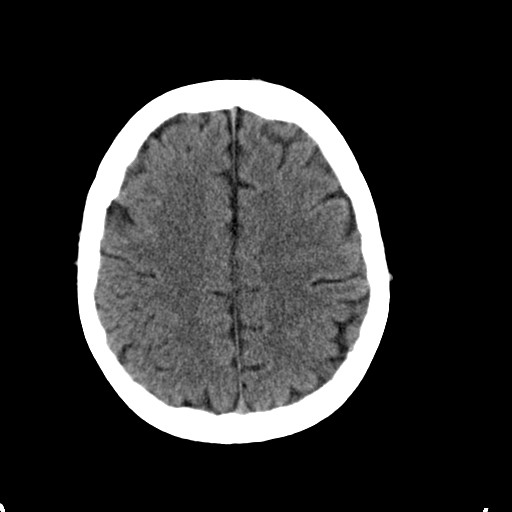
[im 25/32  brain]
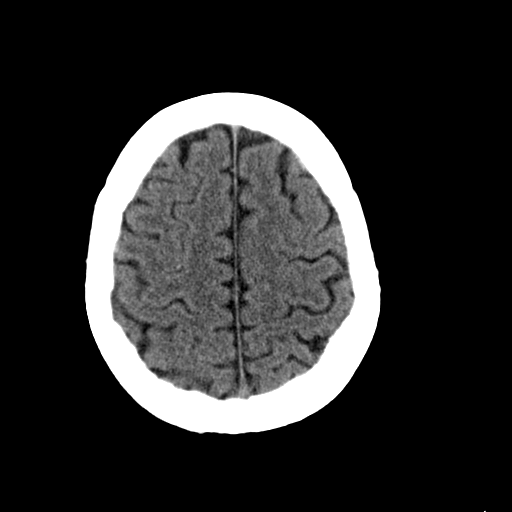
[im 27/32  brain]
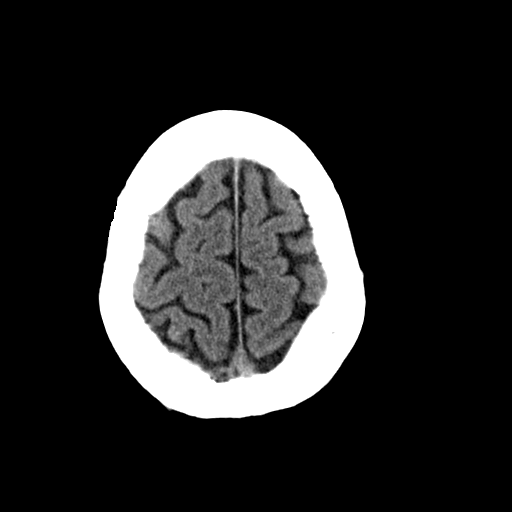
[im 29/32  brain]
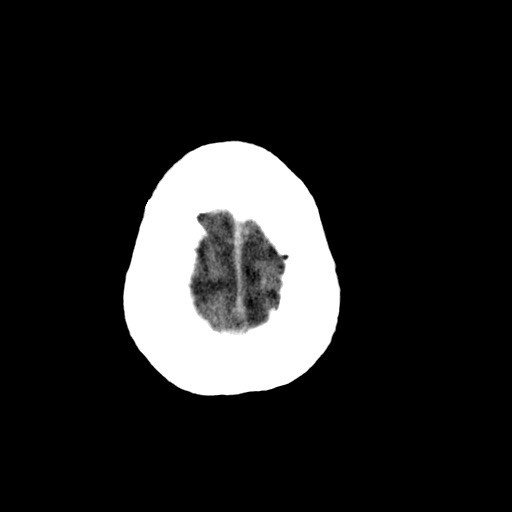
[im 29/32  bone]
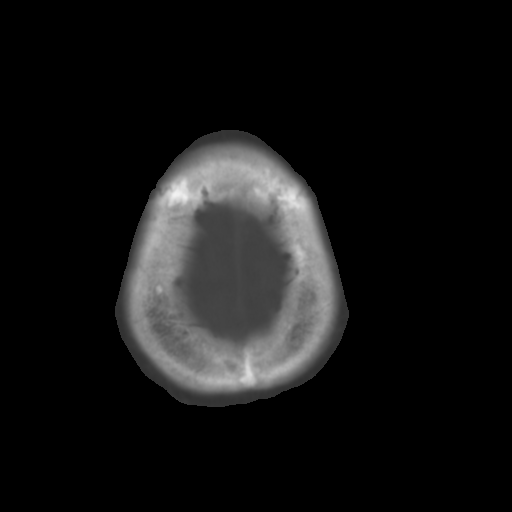

[Series 3: bone · axial · 0.45mm/px · z∈[-592,-548]mm · 3 of 32 slices shown]
[im 3/32  bone]
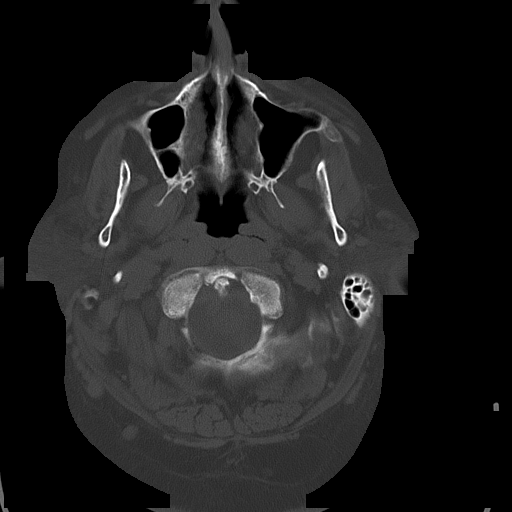
[im 7/32  bone]
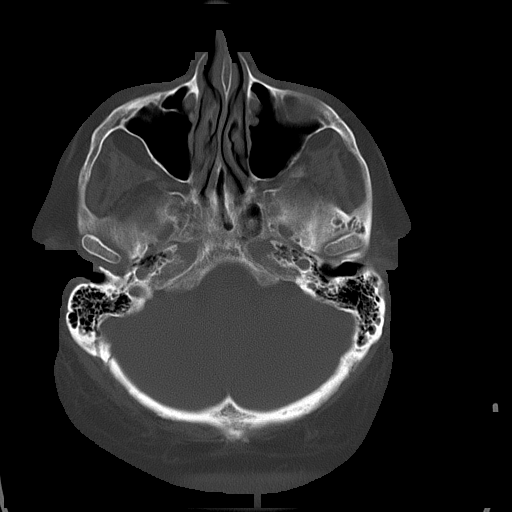
[im 12/32  bone]
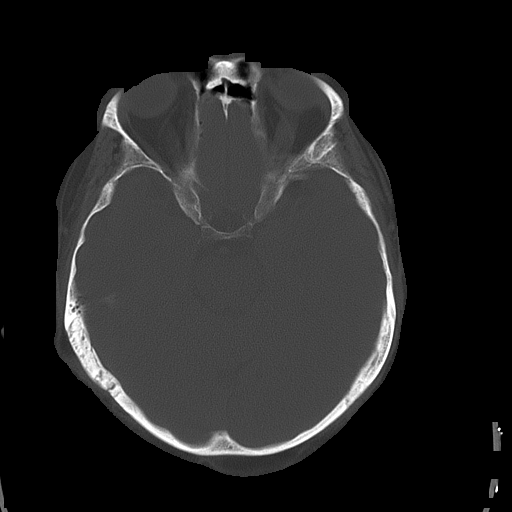

[16 of 30 positions shown; findings below may reference images not displayed]

FINDINGS: There is no evidence for mass effect, midline shift, or extra-axial fluid
collections. There is no evidence for space-occupying lesion, intracranial
hemorrhage, or cortical-based area of infarction.

The osseous structures are unremarkable.
IMPRESSION: No acute intracranial process.

## 2011-10-16 IMAGING — CT CT HEAD WITHOUT CONTRAST
2 series · 16 of 30 positions shown, 20 images · non-contrast
Comparison: none

REASON FOR EXAM: syncope and fall
COMMENTS:

[Series 2: without · axial · non-contrast · 0.44mm/px · z∈[-562,-436]mm · 13 of 31 slices shown, 17 images]
[im 3/31  brain]
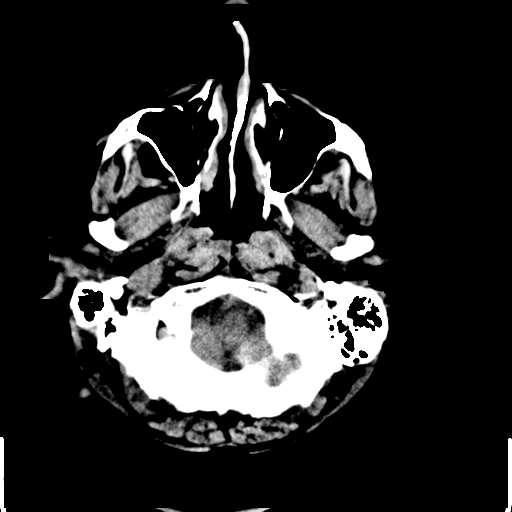
[im 3/31  bone]
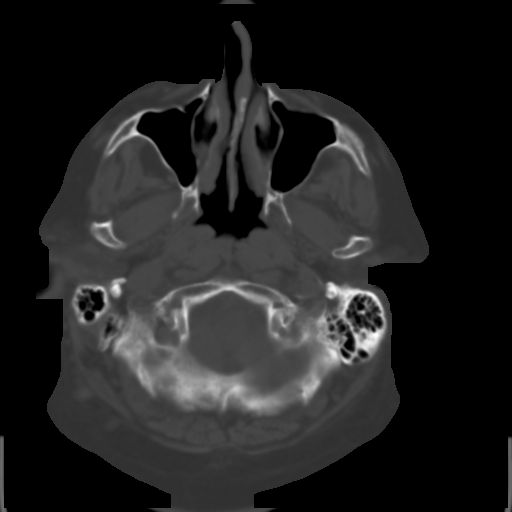
[im 5/31  brain]
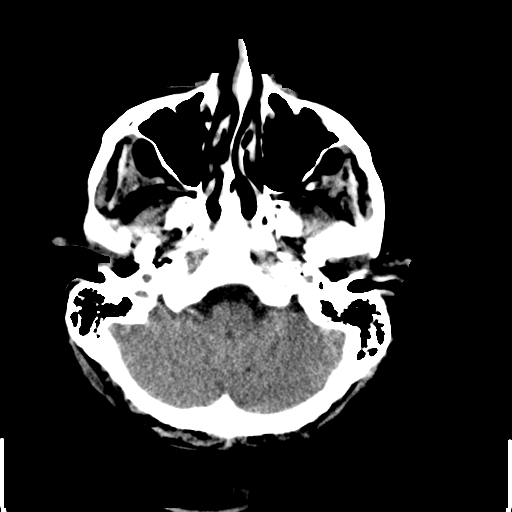
[im 7/31  brain]
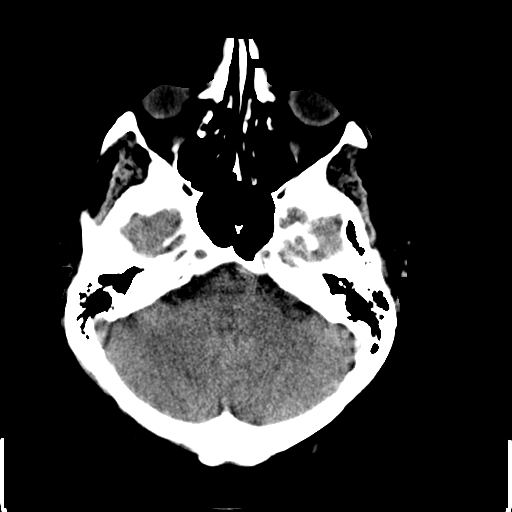
[im 9/31  brain]
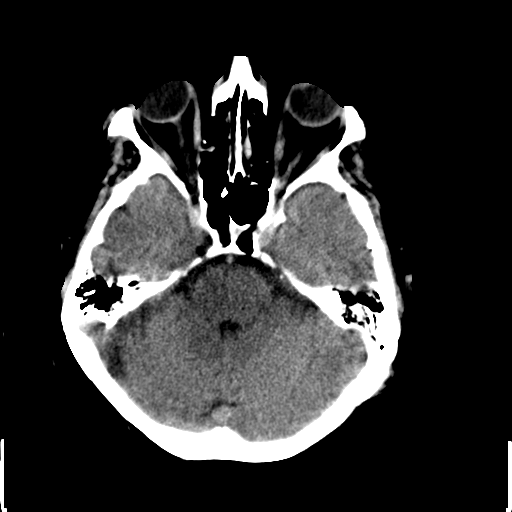
[im 11/31  brain]
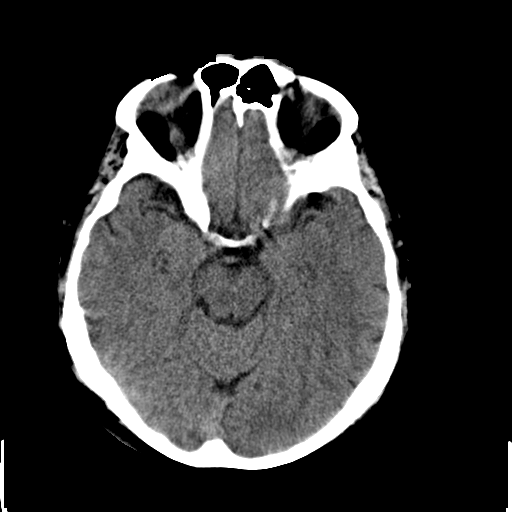
[im 11/31  bone]
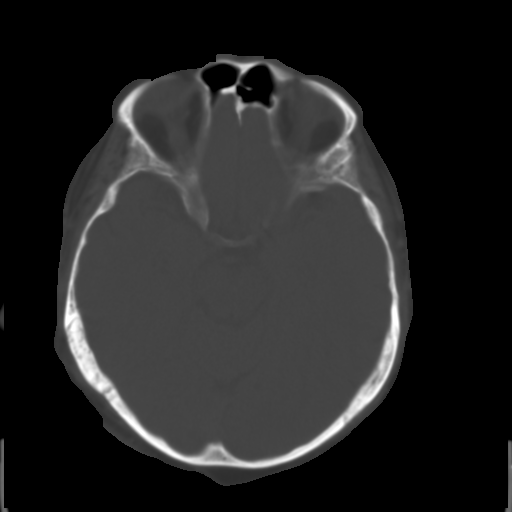
[im 13/31  brain]
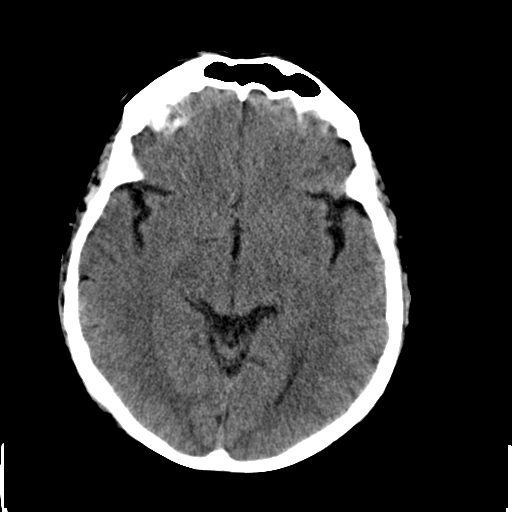
[im 16/31  brain]
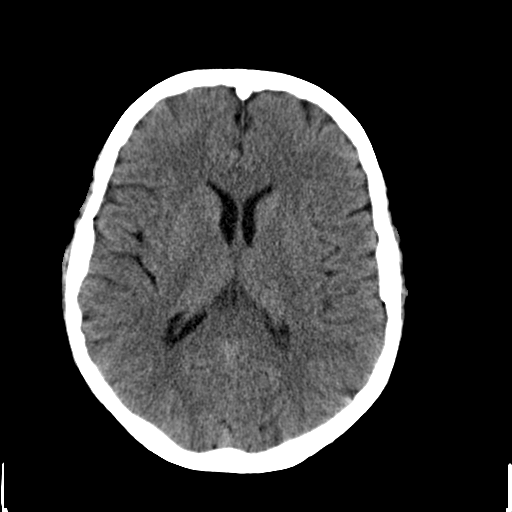
[im 18/31  brain]
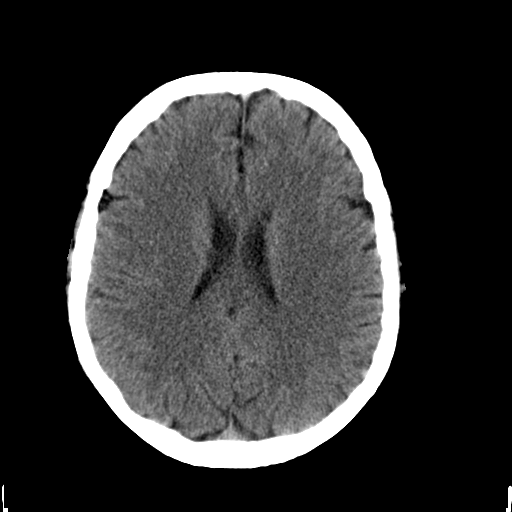
[im 20/31  brain]
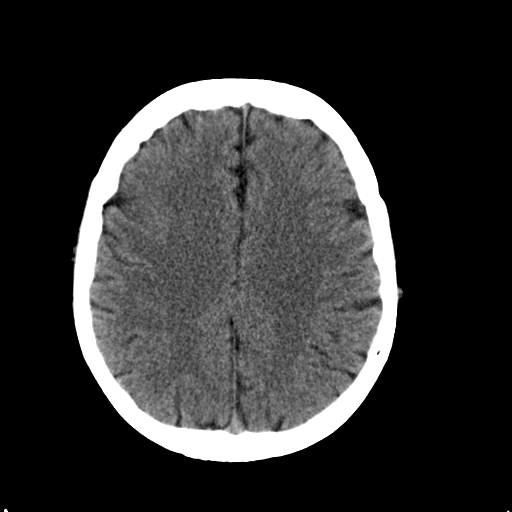
[im 20/31  bone]
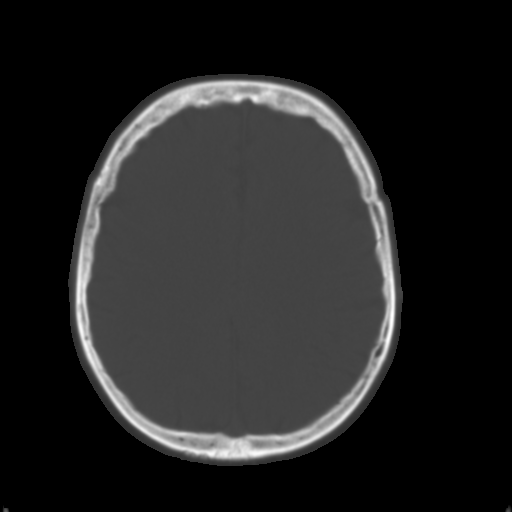
[im 22/31  brain]
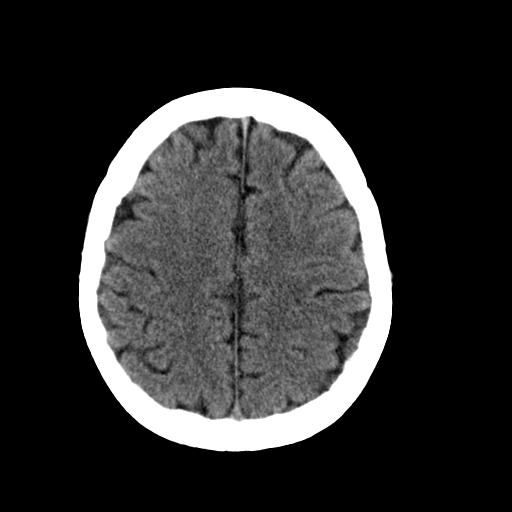
[im 24/31  brain]
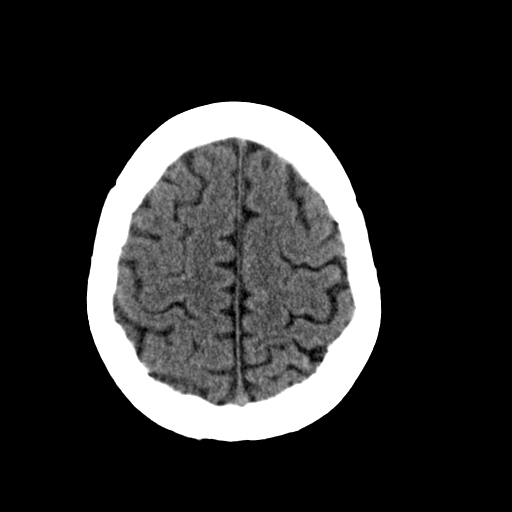
[im 26/31  brain]
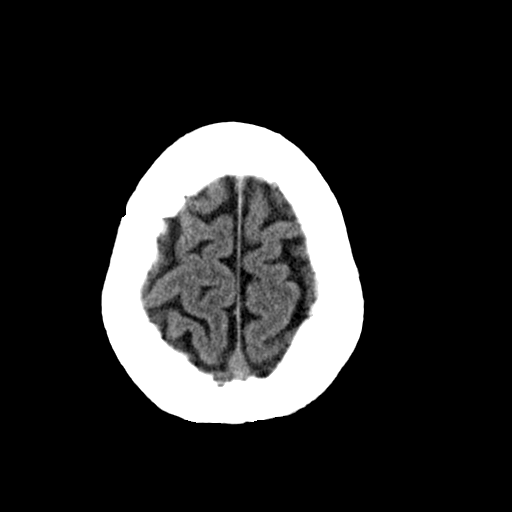
[im 28/31  brain]
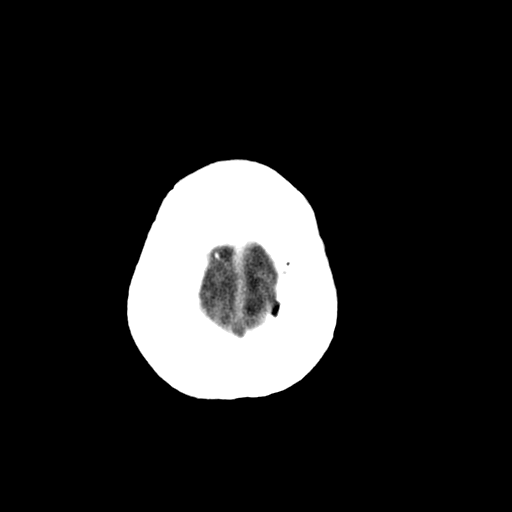
[im 28/31  bone]
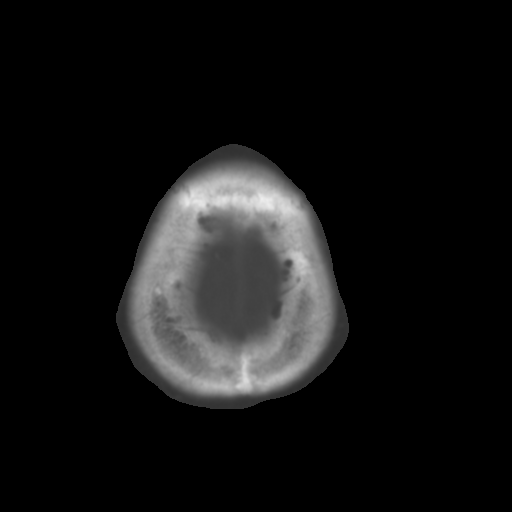

[Series 3: bone · axial · 0.44mm/px · z∈[-562,-522]mm · 3 of 31 slices shown]
[im 3/31  bone]
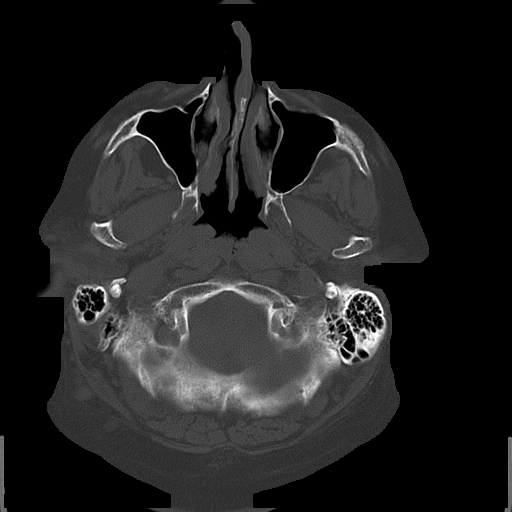
[im 7/31  bone]
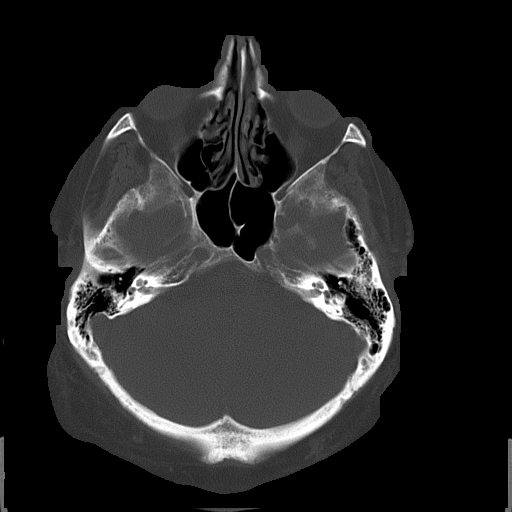
[im 11/31  bone]
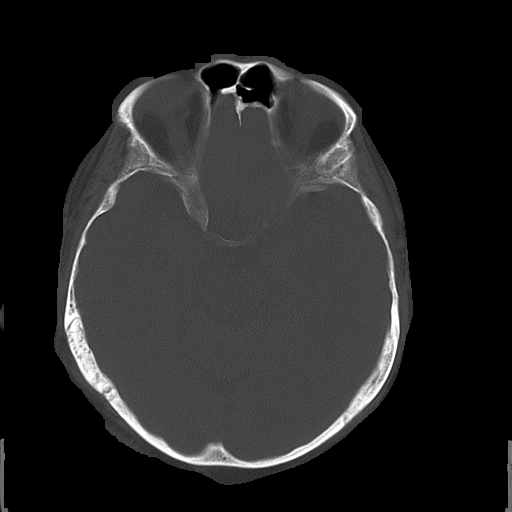

[16 of 30 positions shown; findings below may reference images not displayed]

PROCEDURE:     CT  - CT HEAD WITHOUT CONTRAST  - March 05, 2010  [DATE]

RESULT:     Axial noncontrast CT scanning was performed through the brain at
5 mm intervals and slice thicknesses. Comparison is made to the study 23 February, 2010.

The ventricles are normal in size and position. There is no intracranial
hemorrhage nor intracranial mass effect. The cerebellum and brainstem are
normal in density. There is no finding to suggest an evolving ischemic
infarction. At bone window settings the observed portions of the paranasal
sinuses and mastoid air cells are clear. There is no evidence of an acute
skull fracture.
IMPRESSION: I see no acute intracranial abnormality.

## 2011-10-16 IMAGING — CT CT CHEST W/O CM
1 series · 15 of 31 positions shown, 19 images · non-contrast
Comparison: none

REASON FOR EXAM: hemoptysis
COMMENTS:

[Series 2: soft tissue · axial · 0.90mm/px · z∈[-1180,-876]mm · 15 of 67 slices shown, 19 images]
[im 3/67  mediastinal]
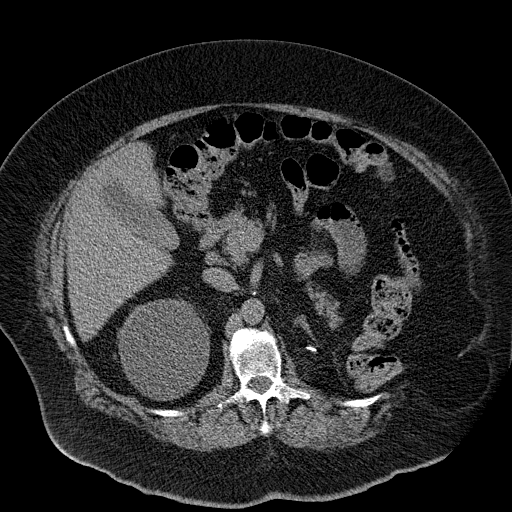
[im 3/67  lung]
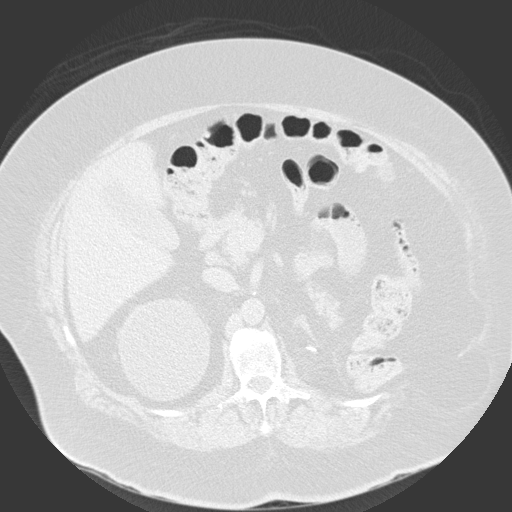
[im 8/67  lung]
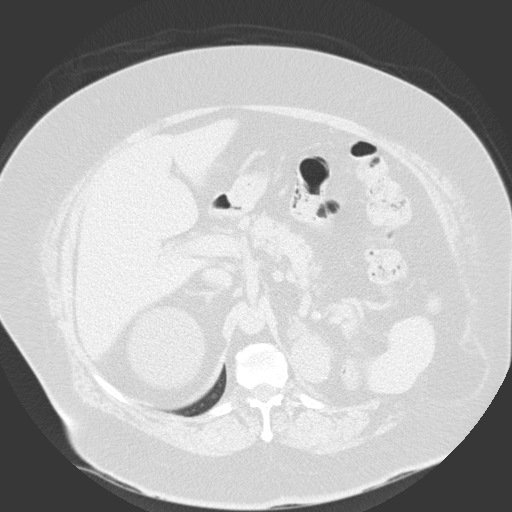
[im 13/67  lung]
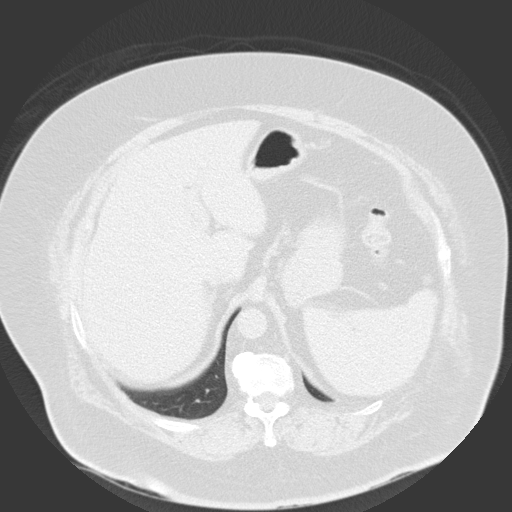
[im 15/67  lung]
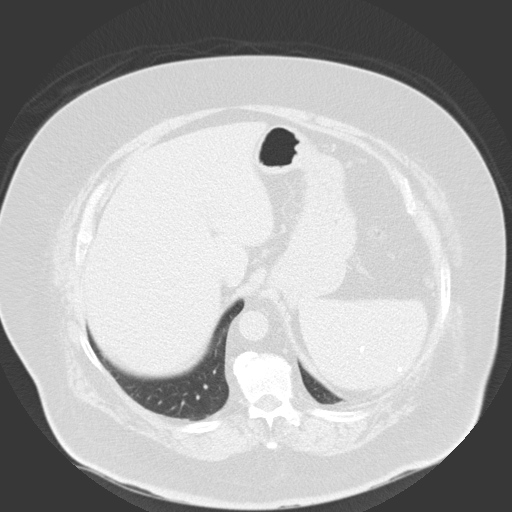
[im 20/67  mediastinal]
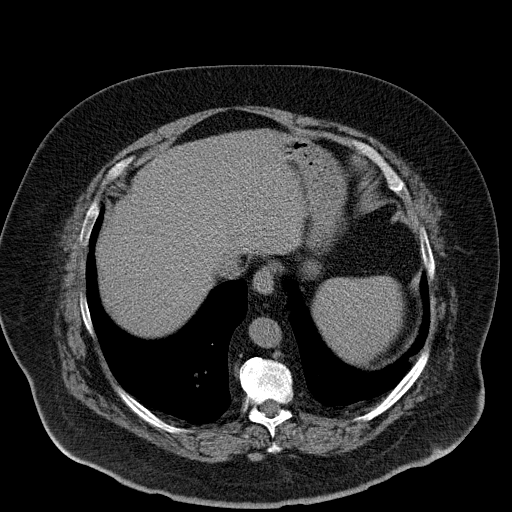
[im 20/67  lung]
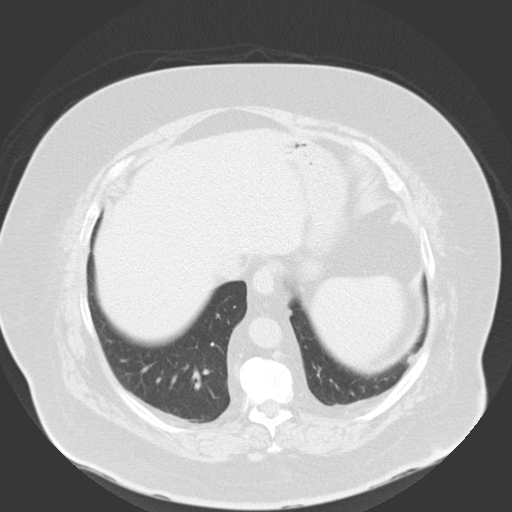
[im 25/67  lung]
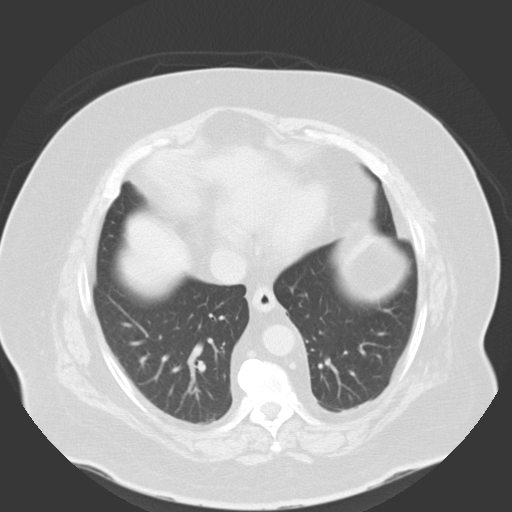
[im 30/67  lung]
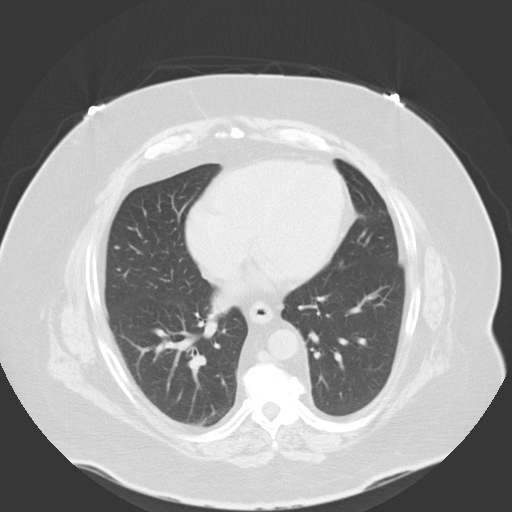
[im 35/67  lung]
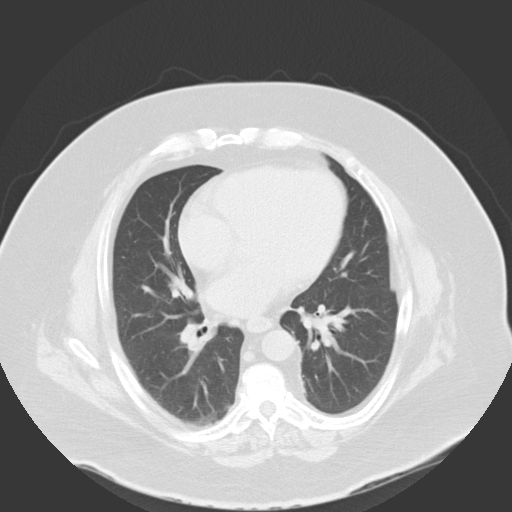
[im 37/67  mediastinal]
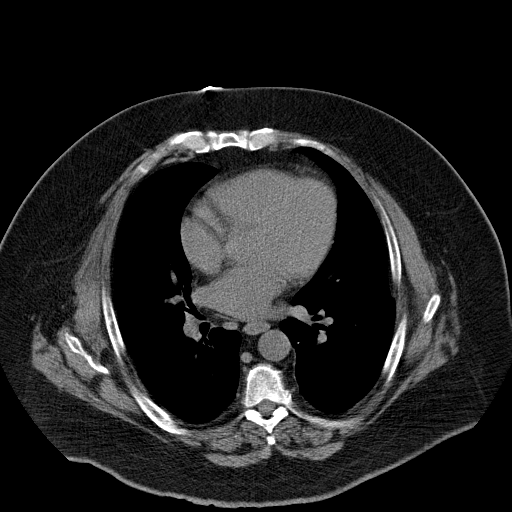
[im 37/67  lung]
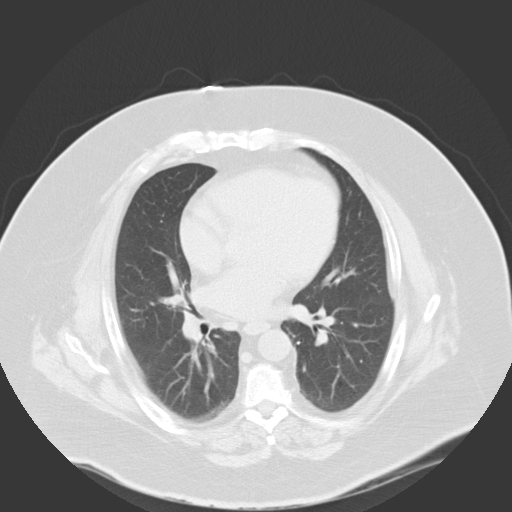
[im 42/67  lung]
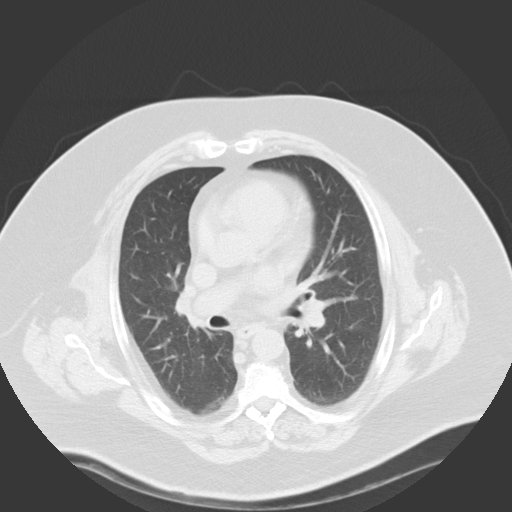
[im 47/67  lung]
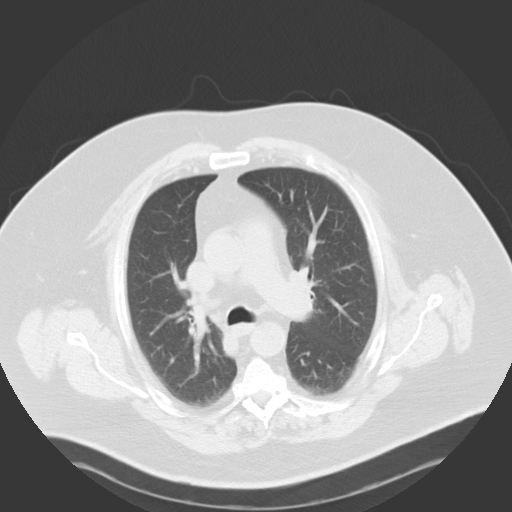
[im 52/67  lung]
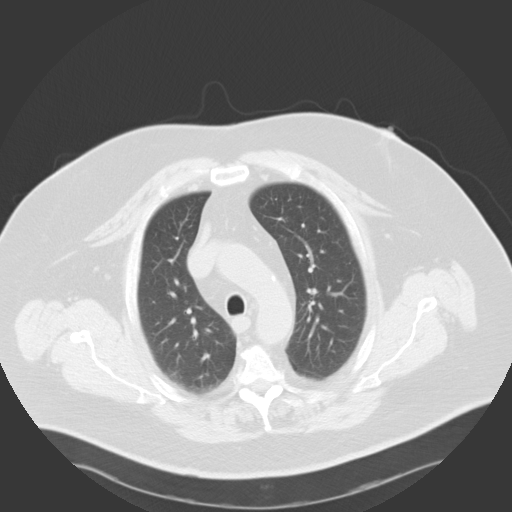
[im 54/67  mediastinal]
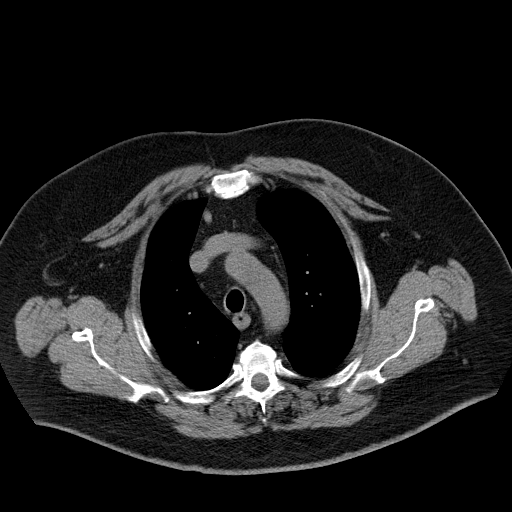
[im 54/67  lung]
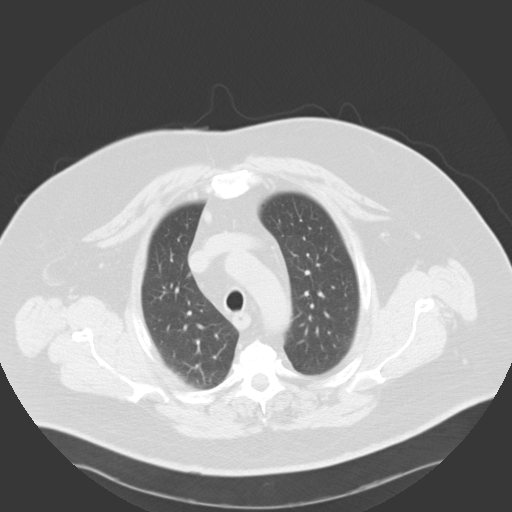
[im 59/67  lung]
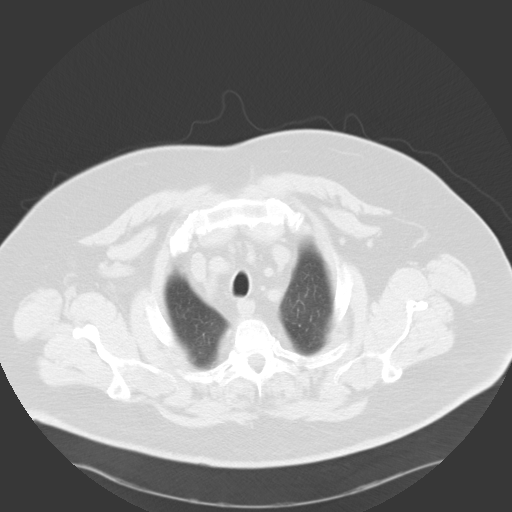
[im 64/67  lung]
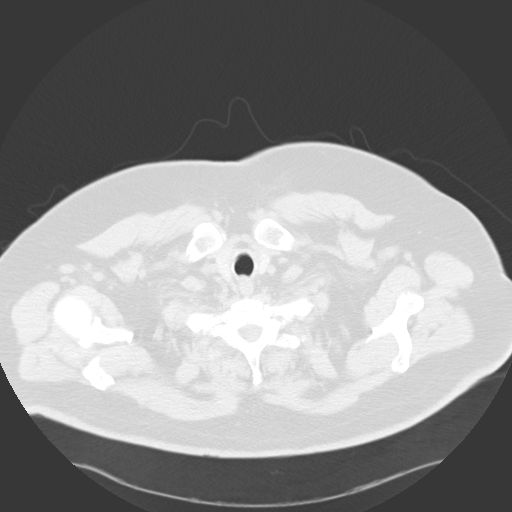

[15 of 31 positions shown; findings below may reference images not displayed]

PROCEDURE:     CT  - CT CHEST WITHOUT CONTRAST  - March 05, 2010  [DATE]

RESULT:     Axial CT scanning was performed through the chest without
contrast at 5 mm intervals and slice thicknesses. At lung window settings I
see no interstitial nor alveolar infiltrates. At mediastinal window settings
the cardiac chambers are normal in size. The caliber of the thoracic aorta
is normal. I see no pathologic sized mediastinal or hilar lymph nodes. There
is no pleural nor pericardial effusion. The thoracic vertebral bodies are
preserved in height.

Within the upper abdomen the observed portions of the liver appear normal.
There is enlargement of the left adrenal gland which appears stable since a
prior CT scan 15 July, 2008. There is a dominant exophytic cystic
appearing structure from the upper pole of the right kidney measuring 8.4 cm
in greatest dimension with Hounsfield measurement of +9. This has been
previously demonstrated.
IMPRESSION: 1. I do not see acute pulmonary parenchymal abnormality.
2. I see no mediastinal nor hilar lymphadenopathy. No endotracheal or
proximal mainstem promptness intraluminal abnormalities are seen.
3. There is no pleural nor pericardial effusion.
4. There are stable enlargement of the left adrenal gland. The patient has
history of prior left nephrectomy.

## 2011-10-16 IMAGING — CR DG CHEST 1V PORT
1 series · 1 of 1 positions shown · non-contrast
Comparison: none

REASON FOR EXAM: cough
COMMENTS:

[view not recorded]
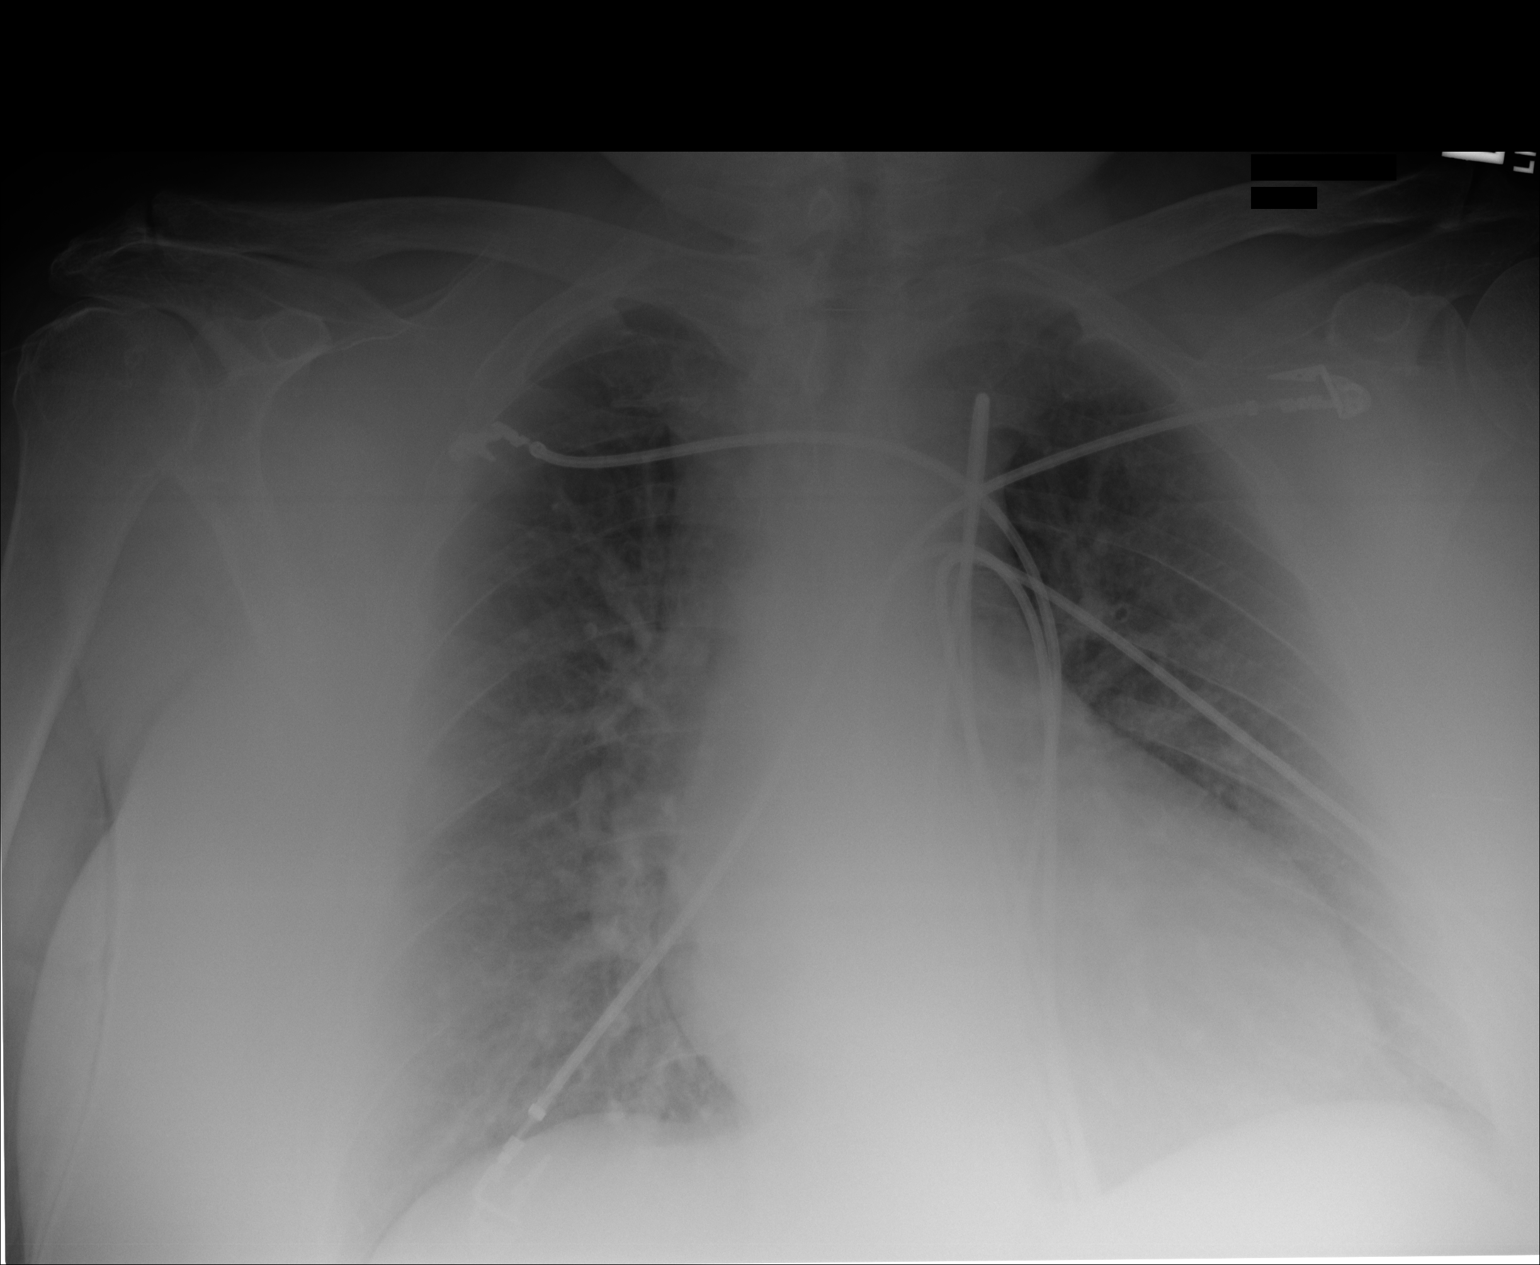

[1 of 1 positions shown; findings below may reference images not displayed]

PROCEDURE:     DXR - DXR PORTABLE CHEST SINGLE VIEW  - March 05, 2010  [DATE]

RESULT:     Comparison is made to the study 17 July, 2008.

The lungs are mildly hyperinflated. The cardiac silhouette is enlarged. The
pulmonary vascularity is engorged and the pulmonary interstitial markings
are mildly increased. I see no pleural effusion.
IMPRESSION: The findings are consistent with congestive heart failure
and mild pulmonary interstitial edema.

## 2011-10-16 IMAGING — CR RIGHT HIP - COMPLETE 2+ VIEW
1 series · 2 of 2 positions shown · non-contrast
Comparison: none

REASON FOR EXAM: trauma
COMMENTS:

[Series 1: view not recorded · 0.17mm/px · 2 of 2 slices shown]
[im 1/2]
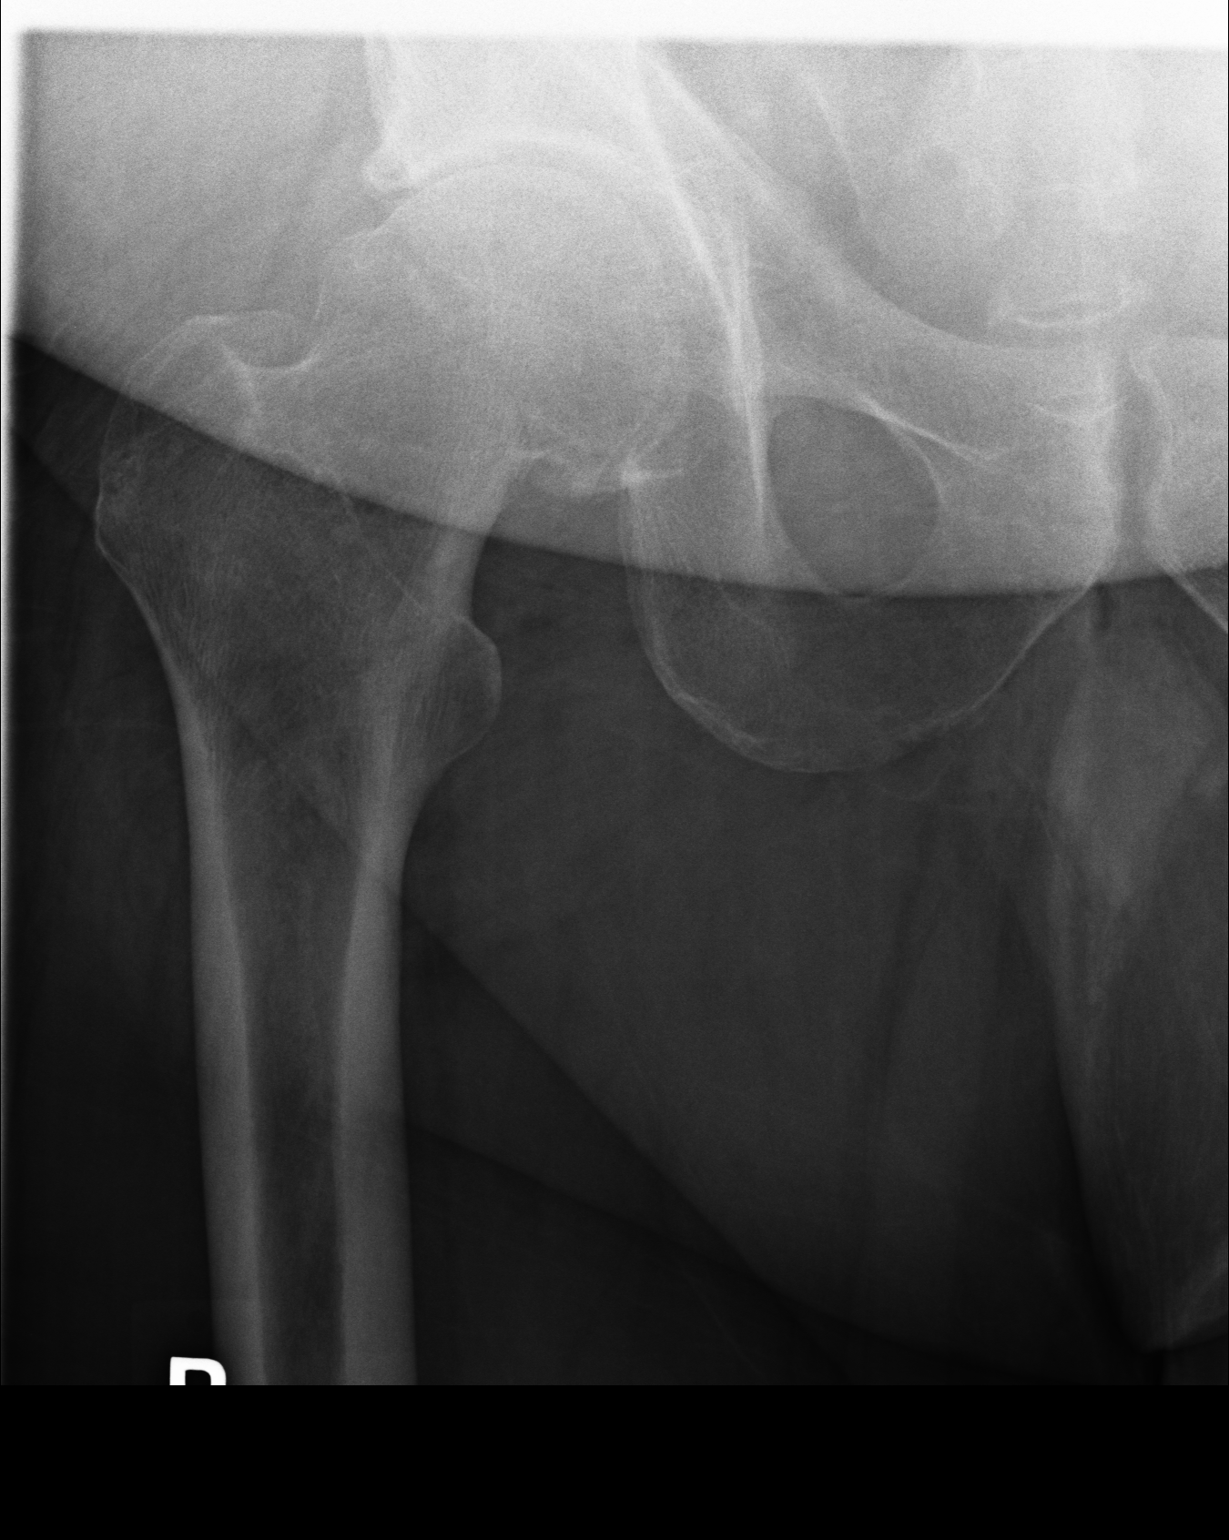
[im 2/2]
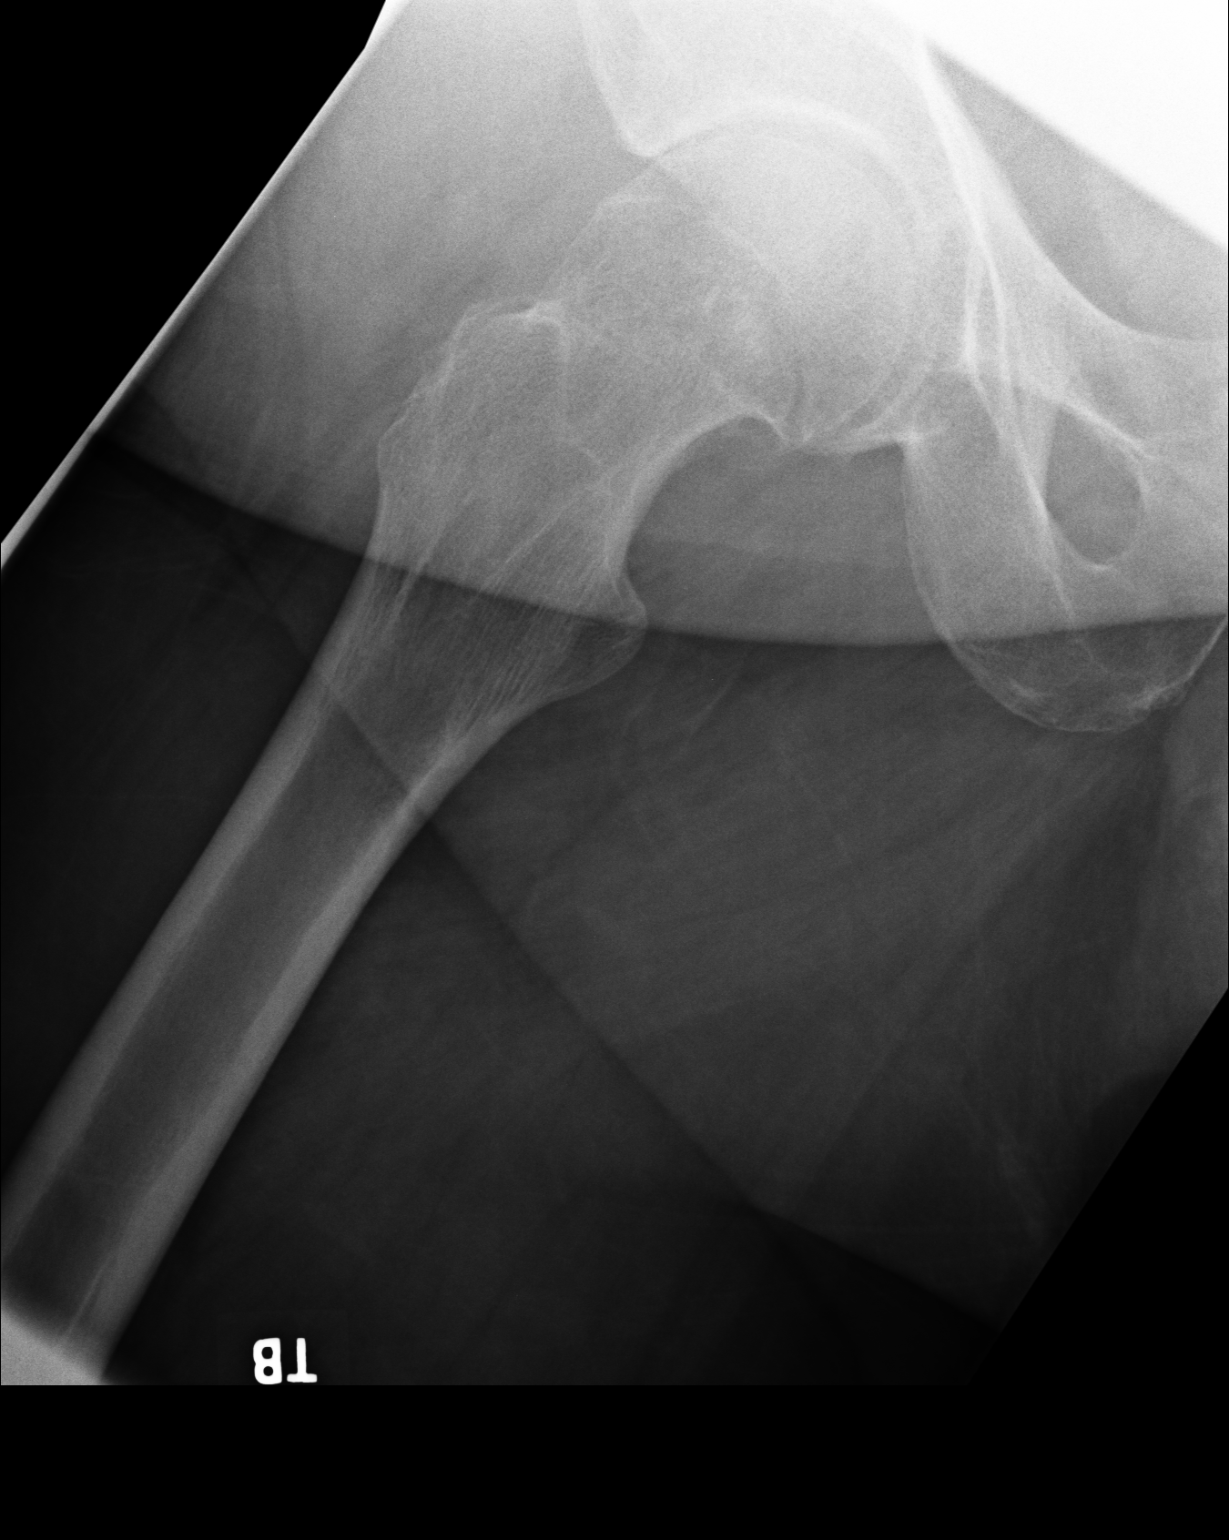

[2 of 2 positions shown; findings below may reference images not displayed]

PROCEDURE:     DXR - DXR HIP RIGHT COMPLETE  - March 05, 2010  [DATE]

RESULT:     AP and lateral views of the right hip are submitted. I do not
see objective evidence of an acute fracture. Mild narrowing of the hip joint
space is present consistent with osteoarthritis. The intertrochanteric
region appears normal.
IMPRESSION: I see no objective evidence of an acute right hip fracture.
If there are strong clinical concerns of an occult fracture, followup CT
scanning is available upon request.

## 2011-10-23 ENCOUNTER — Emergency Department: Payer: Self-pay | Admitting: Emergency Medicine

## 2011-11-23 IMAGING — CT CT NECK WITH CONTRAST
1 of 2 series · 9 of 14 positions shown, 12 images · IV contrast (agent unspecified)
Comparison: none

REASON FOR EXAM: NECK MASS
COMMENTS:

PROCEDURE:     KCT - KCT NECK WITH CONTRAST  - April 12, 2010  [DATE]
RESULT:
TECHNIQUE: Helical 3 mm sections were obtained from the skull base through
the thoracic inlet. This is status post intravenous administration of 75 mL
Jsovue-XMD.

[Series 2: neck 3.0 · axial · 0.48mm/px · z∈[-232,-48]mm · 9 of 77 slices shown, 12 images]
[im 8/77  soft-tissue]
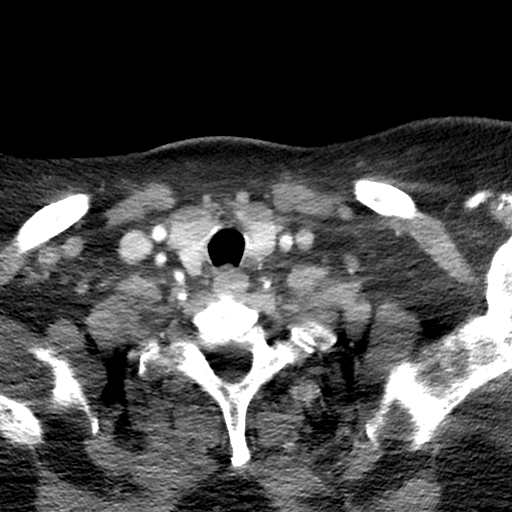
[im 8/77  bone]
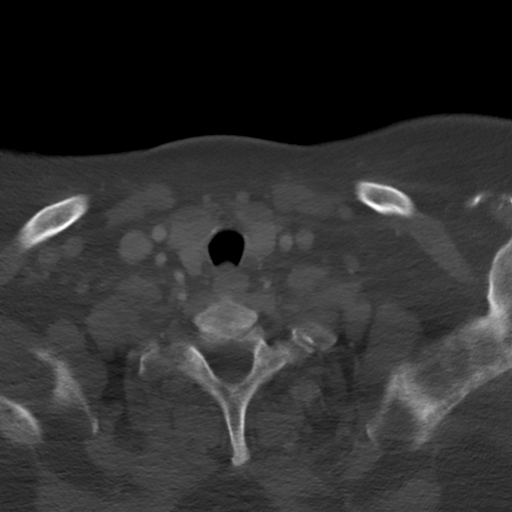
[im 16/77  bone]
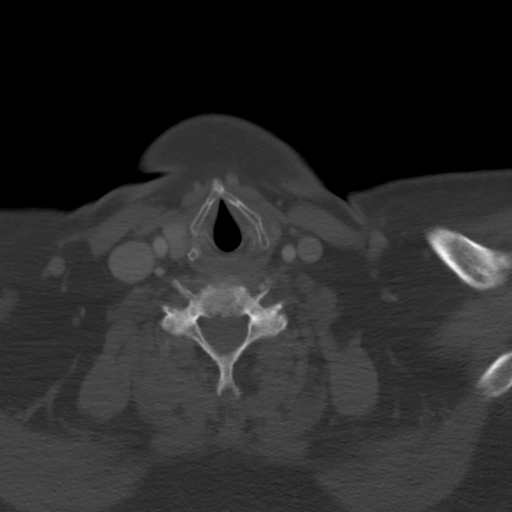
[im 23/77  bone]
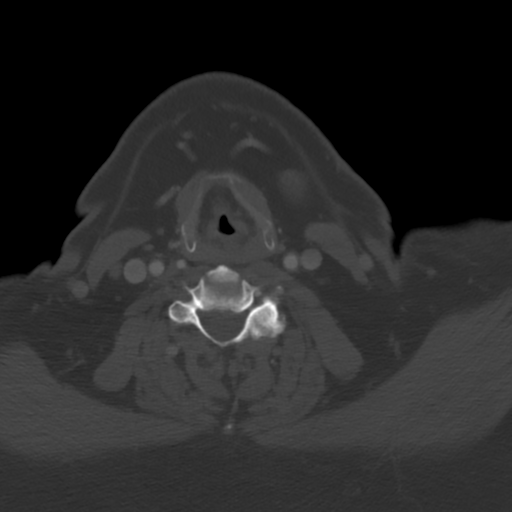
[im 31/77  bone]
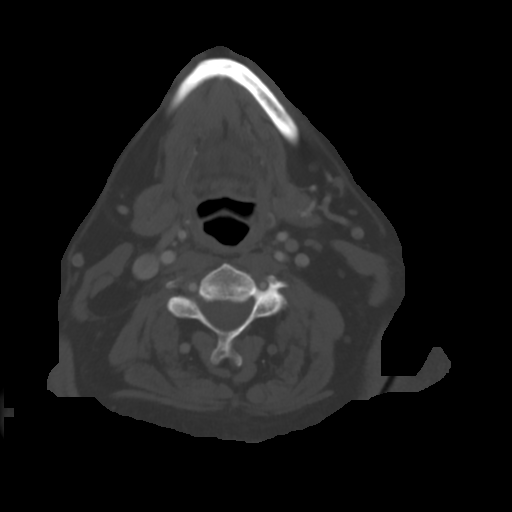
[im 39/77  soft-tissue]
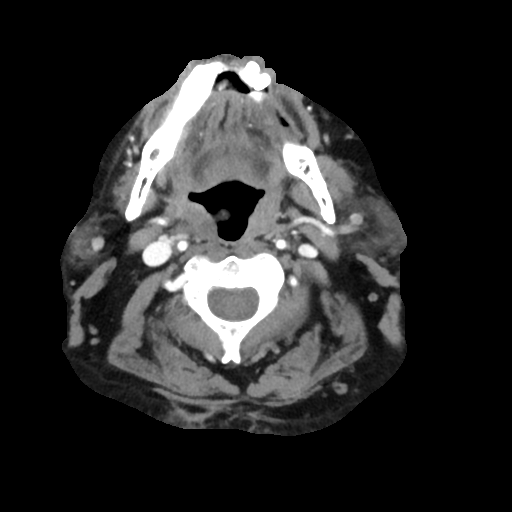
[im 39/77  bone]
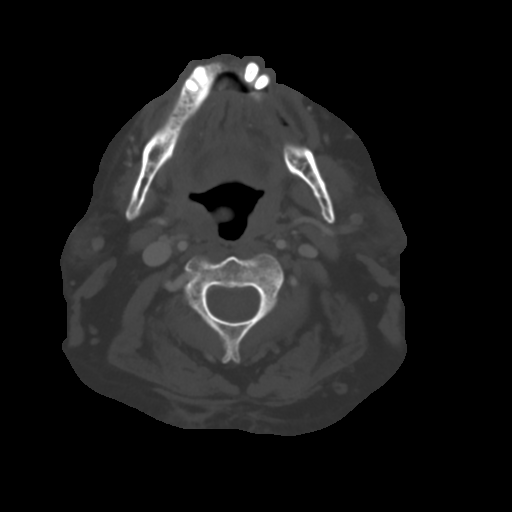
[im 46/77  bone]
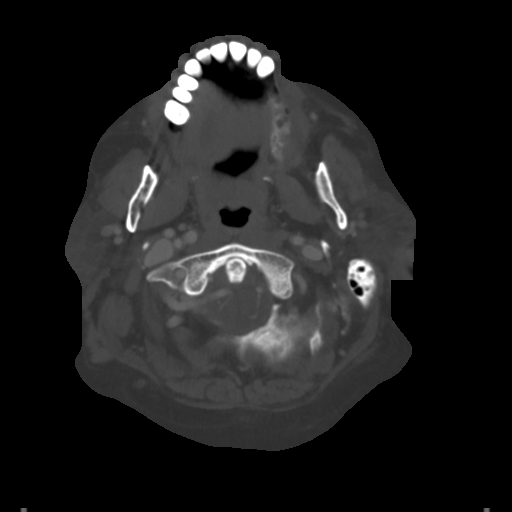
[im 54/77  bone]
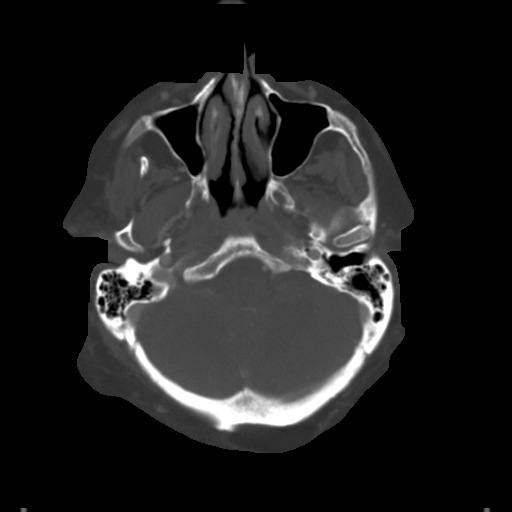
[im 61/77  bone]
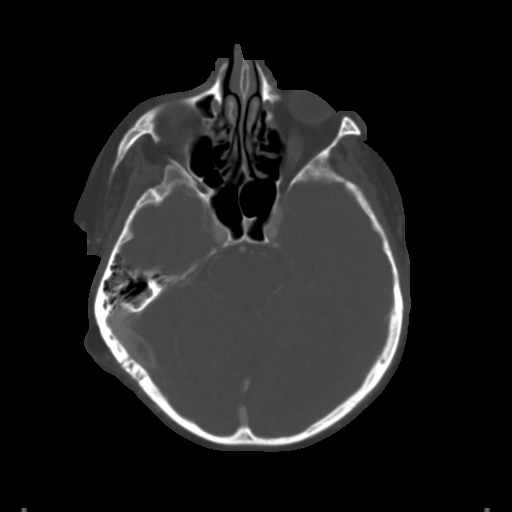
[im 69/77  soft-tissue]
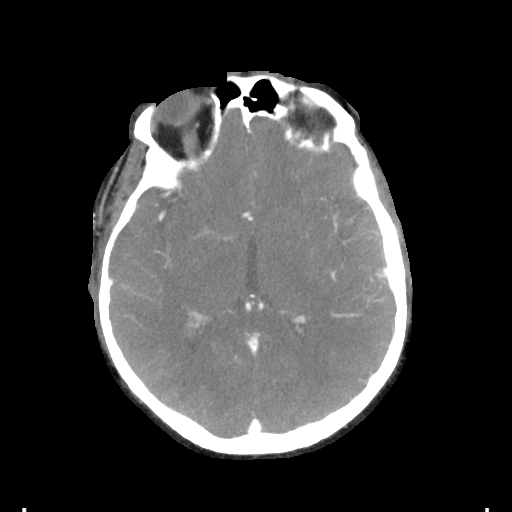
[im 69/77  bone]
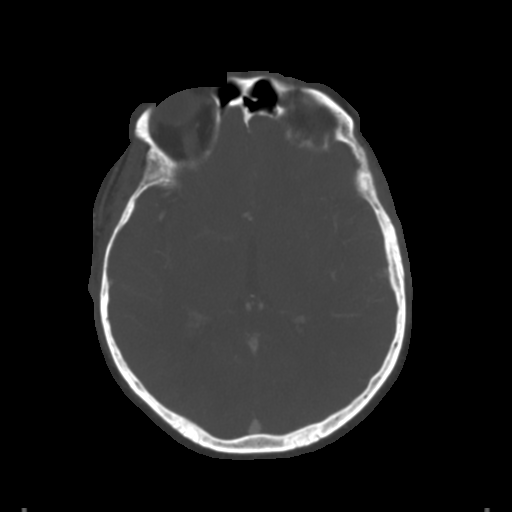

[9 of 14 positions shown; findings below may reference images not displayed]

Evaluation of the right parotid gland demonstrates diffuse enlargement with
heterogeneous enhancement. There is mild stranding within the surrounding
mesenteric fat. No definite masses, nodules or loculated fluid collections
are appreciated. The right parotid measures 3.85 x 2.66 cm in maximal
orthogonal dimension.  The remaining components of the neck demonstrate no
further evidence of masses, nodules, free fluid or loculated fluid
collections. Multiple mild to moderately enlarged lymph nodes are identified
within the anterior to posterior cervical chains. The largest nodes project
within the anterior carotid space on the right measuring 1.2 x 0.67 cm and
on the left 1.3 x 0.59 cm. The lymph nodes demonstrate a fatty hilum. The
musculature is symmetric without evidence to suggest edema or infiltration.
The vascular structures are unremarkable. Pharyngeal and epiglottal regions
are unremarkable. The visualized paranasal sinuses are unremarkable.
IMPRESSION: 1. Enlarged right parotid with heterogeneous enhancement and surrounding
stranding in the mesenteric fat. A definite calculus is not identified
within the expected course of the parotid duct. These findings are
concerning for parotitis.
2. Shotty lymph nodes within the anterior and posterior cervical chains.

## 2011-12-17 IMAGING — CT CT NECK WITH CONTRAST
1 of 2 series · 9 of 14 positions shown, 12 images · IV contrast (agent unspecified)
Comparison: None

REASON FOR EXAM: trouble swallowing, s/p salivary gland removal r
COMMENTS:

PROCEDURE:     CT  - CT NECK WITH CONTRAST  - May 06, 2010  [DATE]
RESULT:     Indication: Trouble swallowing
TECHNIQUE: Multiple sequential axial images from the apices of the lungs to
the level of the orbits obtained with 100 ml Qsovue-3YQ IV contrast.

[Series 2: soft tissue · axial · 0.47mm/px · z∈[-308,-48]mm · 9 of 109 slices shown, 12 images]
[im 11/109  soft-tissue]
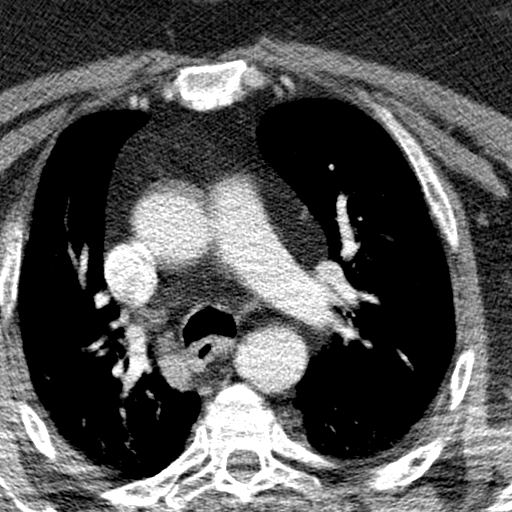
[im 11/109  bone]
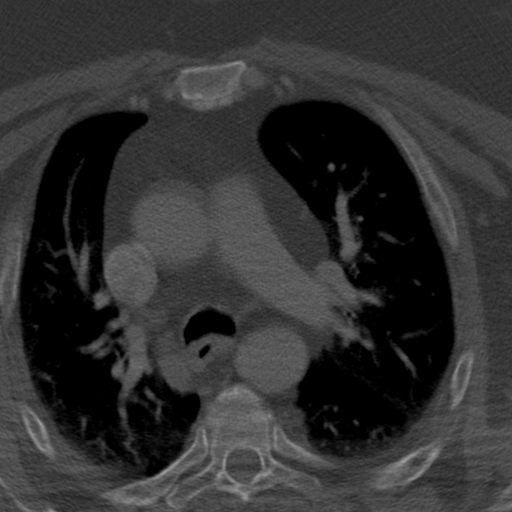
[im 22/109  bone]
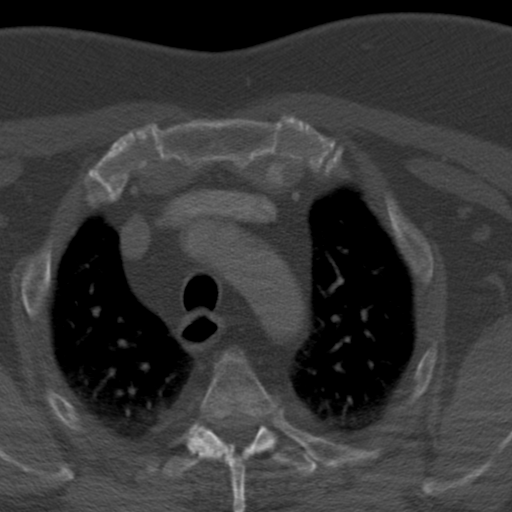
[im 33/109  bone]
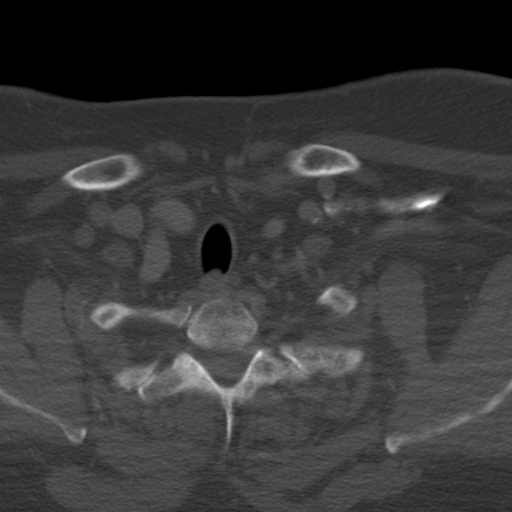
[im 44/109  bone]
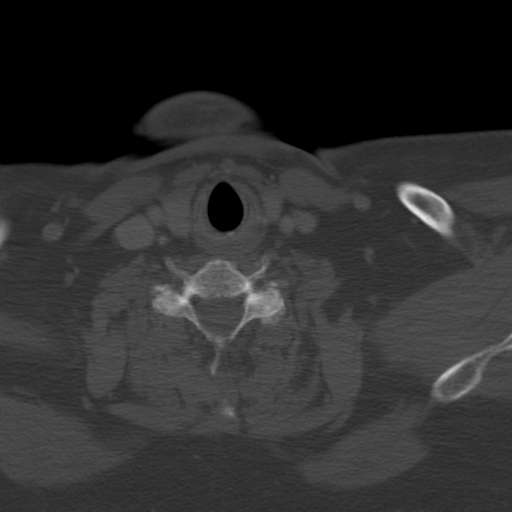
[im 55/109  soft-tissue]
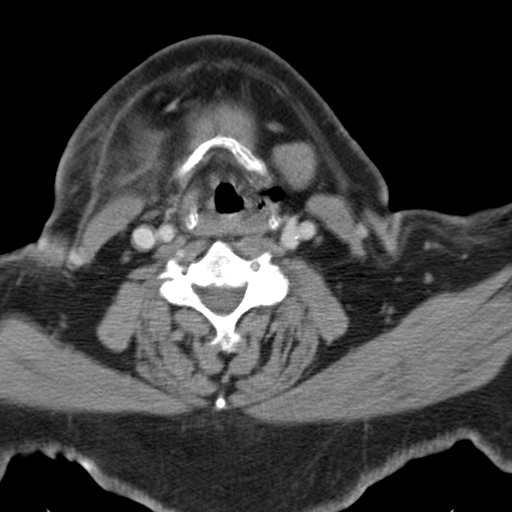
[im 55/109  bone]
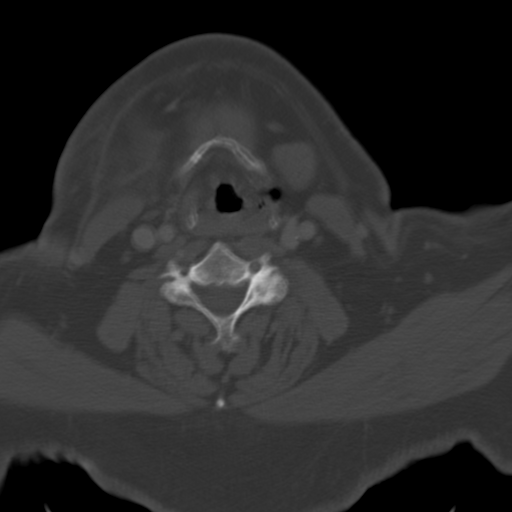
[im 65/109  bone]
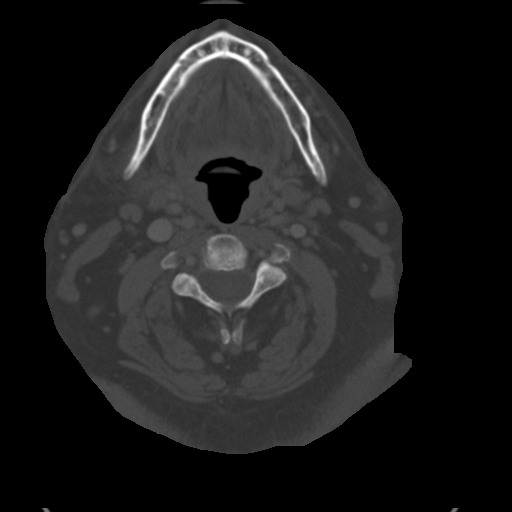
[im 76/109  bone]
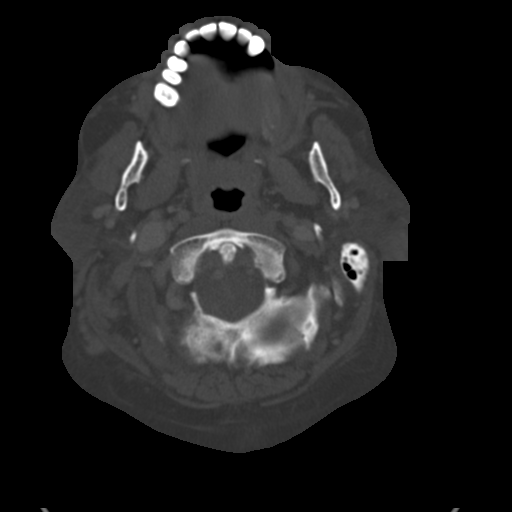
[im 87/109  bone]
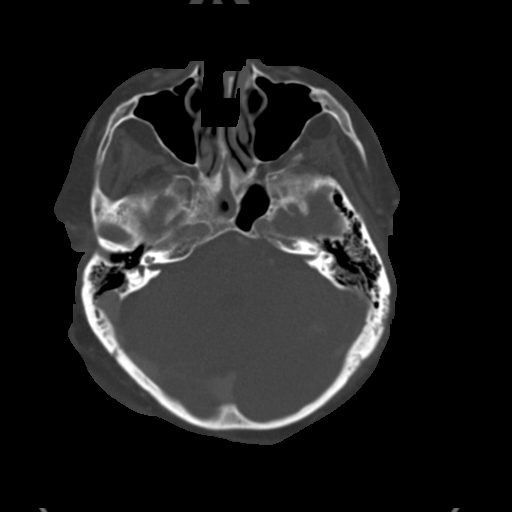
[im 98/109  soft-tissue]
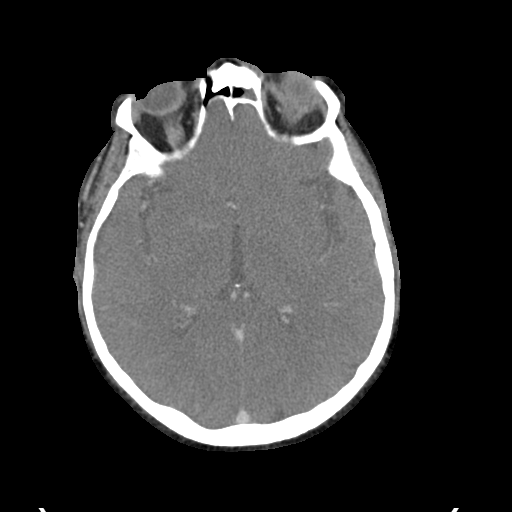
[im 98/109  bone]
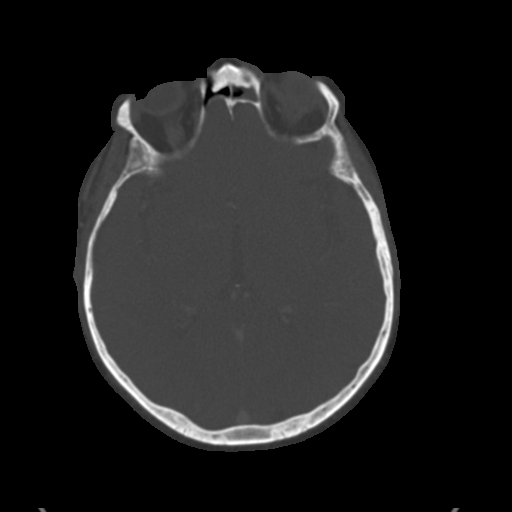

[9 of 14 positions shown; findings below may reference images not displayed]

FINDINGS: There has been removal of the right submandibular gland. There is fat
stranding in the region of the right submandibular gland likely reflecting
postoperative change. There is no focal fluid collection to suggest an
abscess. There is no lymphadenopathy. There is no nasopharyngeal or
oropharyngeal mass. There is no focal fluid collection. The airway is
patent. The visualized portions of the carotid arteries and internal jugular
veins are patent.

The visualized portions of the brain demonstrate no focal abnormality.

There is mild mucosal thickening in the right sphenoid sinus.

There is no lytic or blastic osseous lesion.
IMPRESSION: There is fat stranding in the region of the right submandibular gland likely
reflecting postoperative change. There is no focal fluid collection to
suggest an abscess.

## 2012-02-06 IMAGING — CR DG LUMBAR SPINE 2-3V
1 series · 3 of 3 positions shown · non-contrast
Comparison: none

REASON FOR EXAM: back pain
COMMENTS:

PROCEDURE:     DXR - DXR LUMBAR SPINE AP AND LATERAL  - June 26, 2010 [DATE]
RESULT:     Five non-rib bearing lumbar vertebral bodies are appreciated.

[Series 1: view not recorded · 0.17mm/px · 3 of 3 slices shown]
[im 1/3]
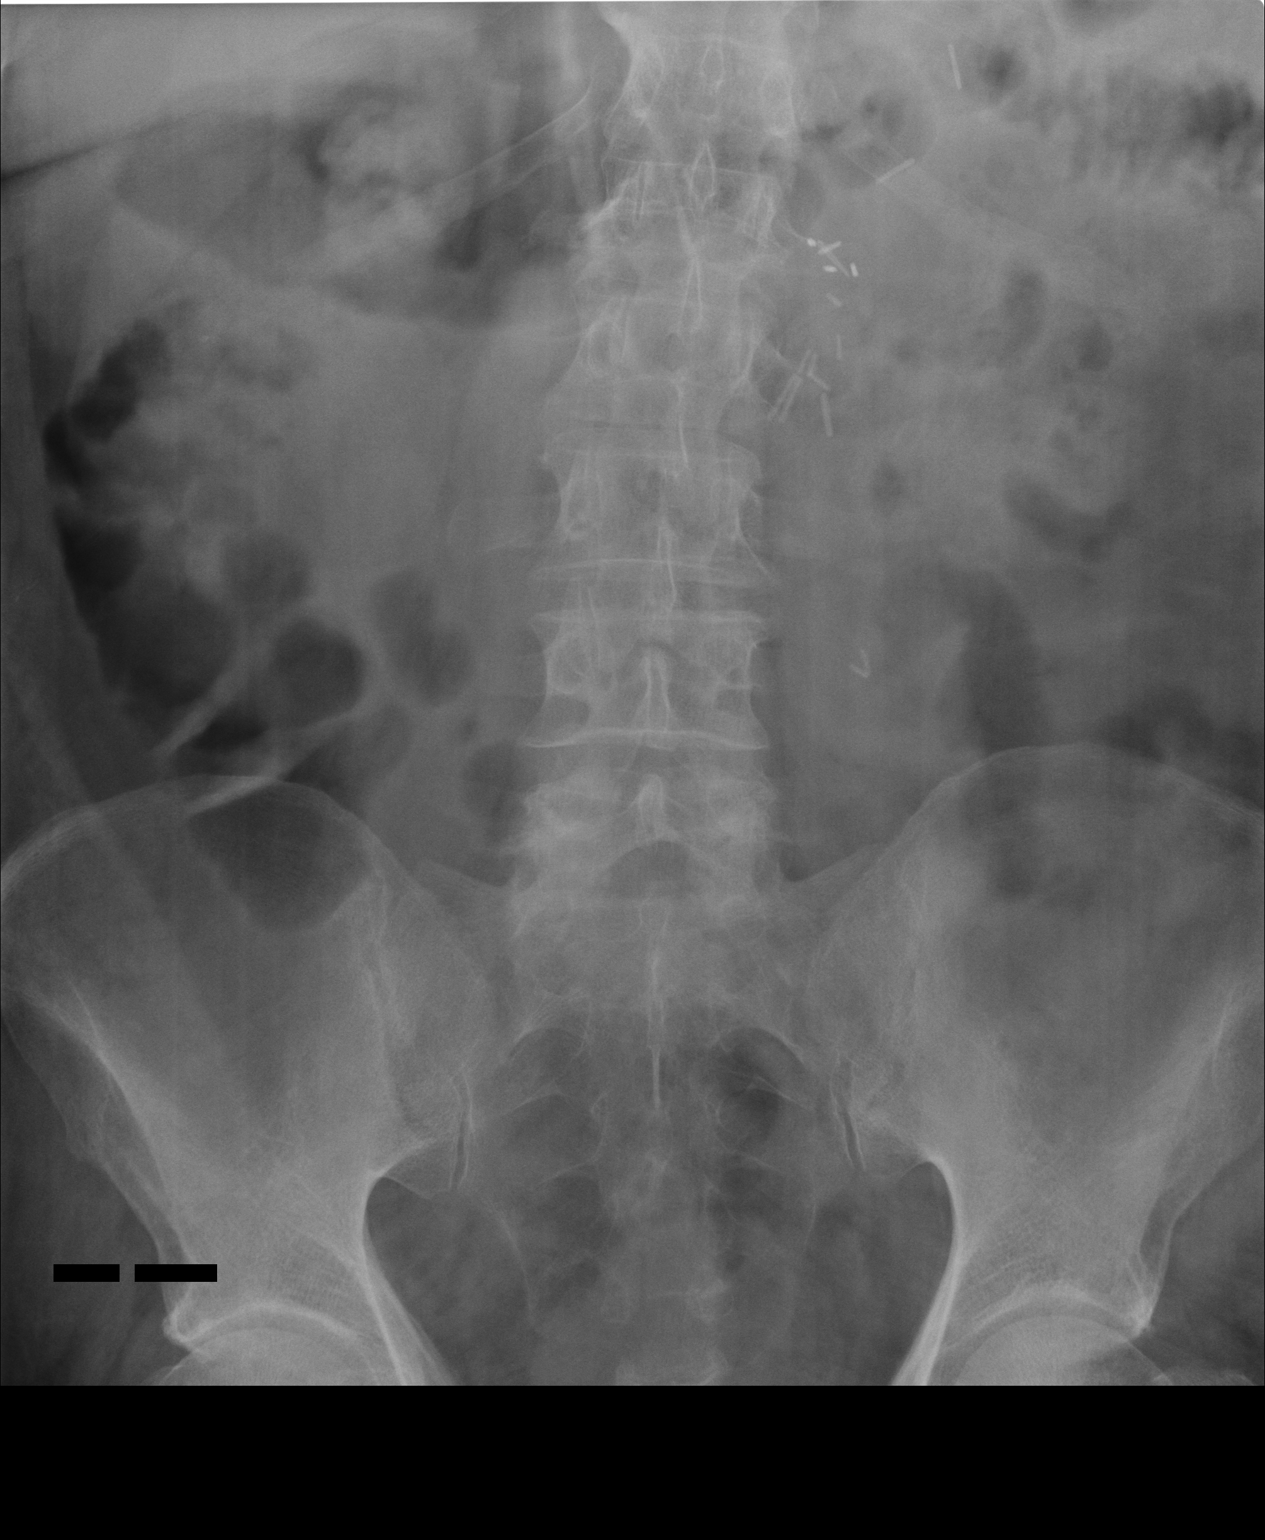
[im 2/3]
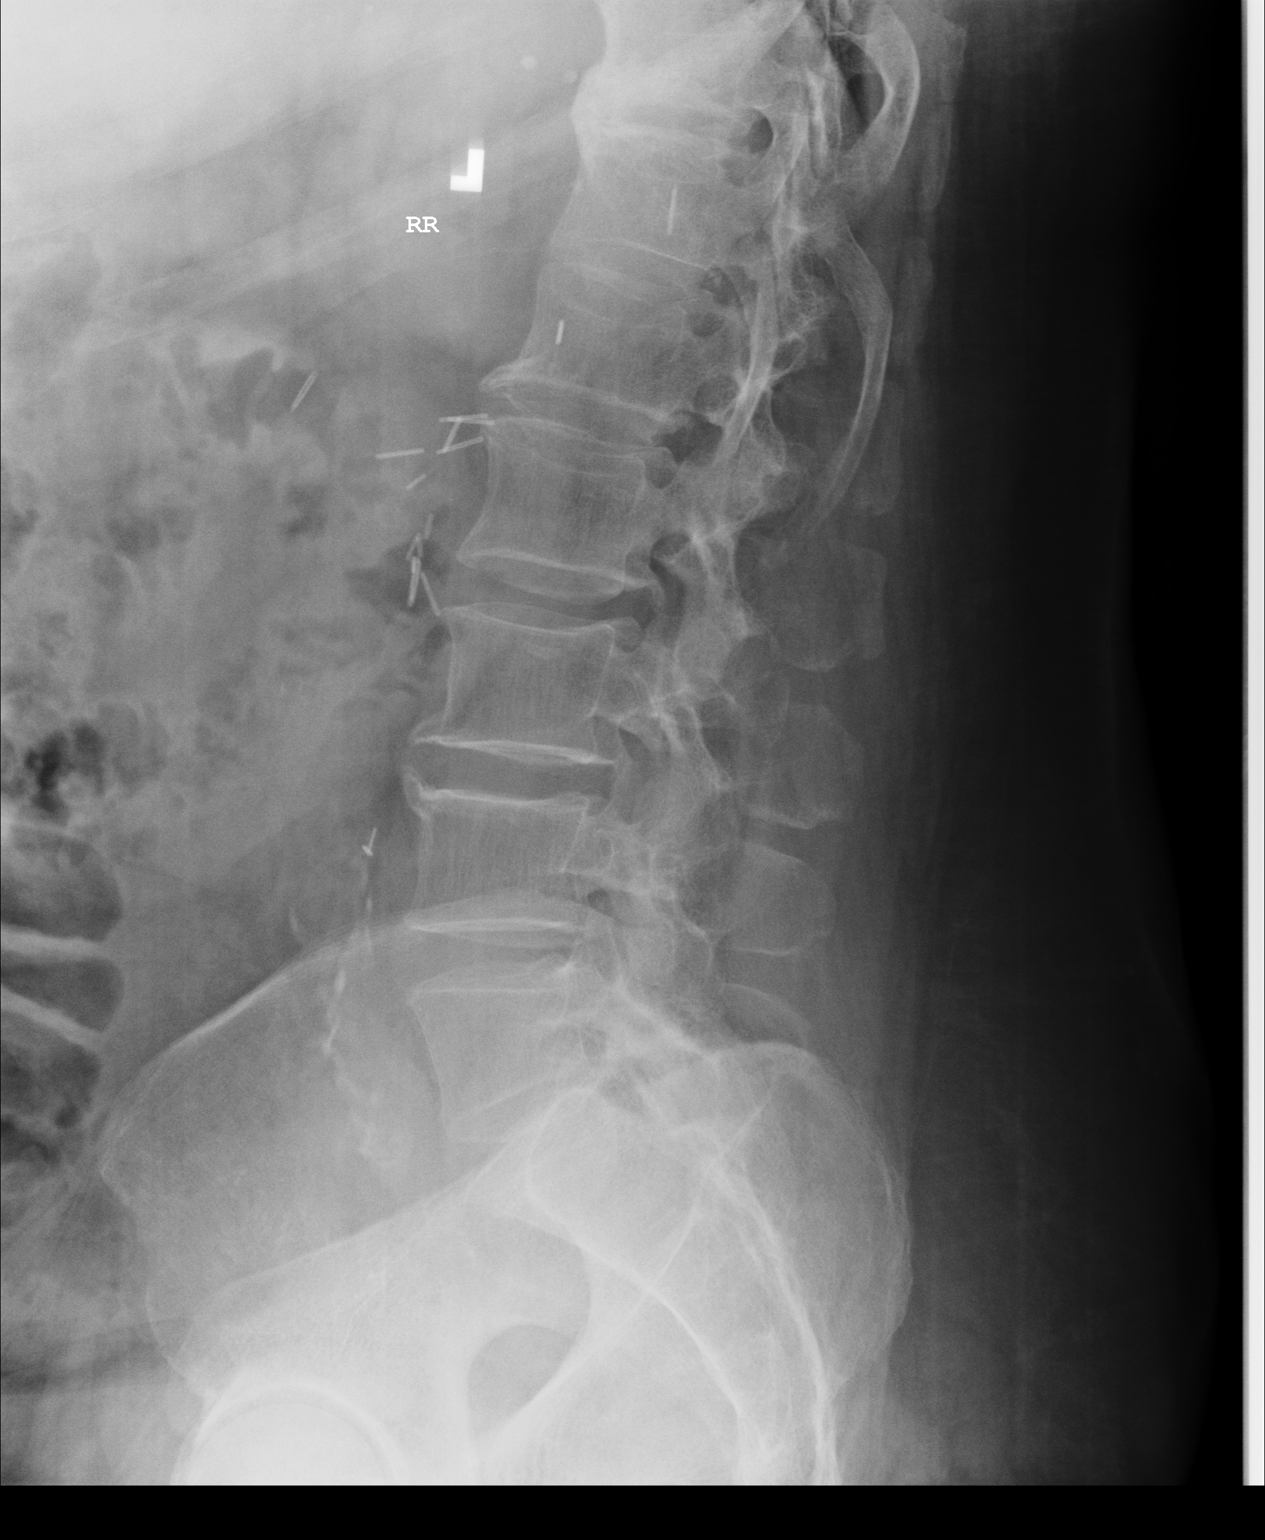
[im 3/3]
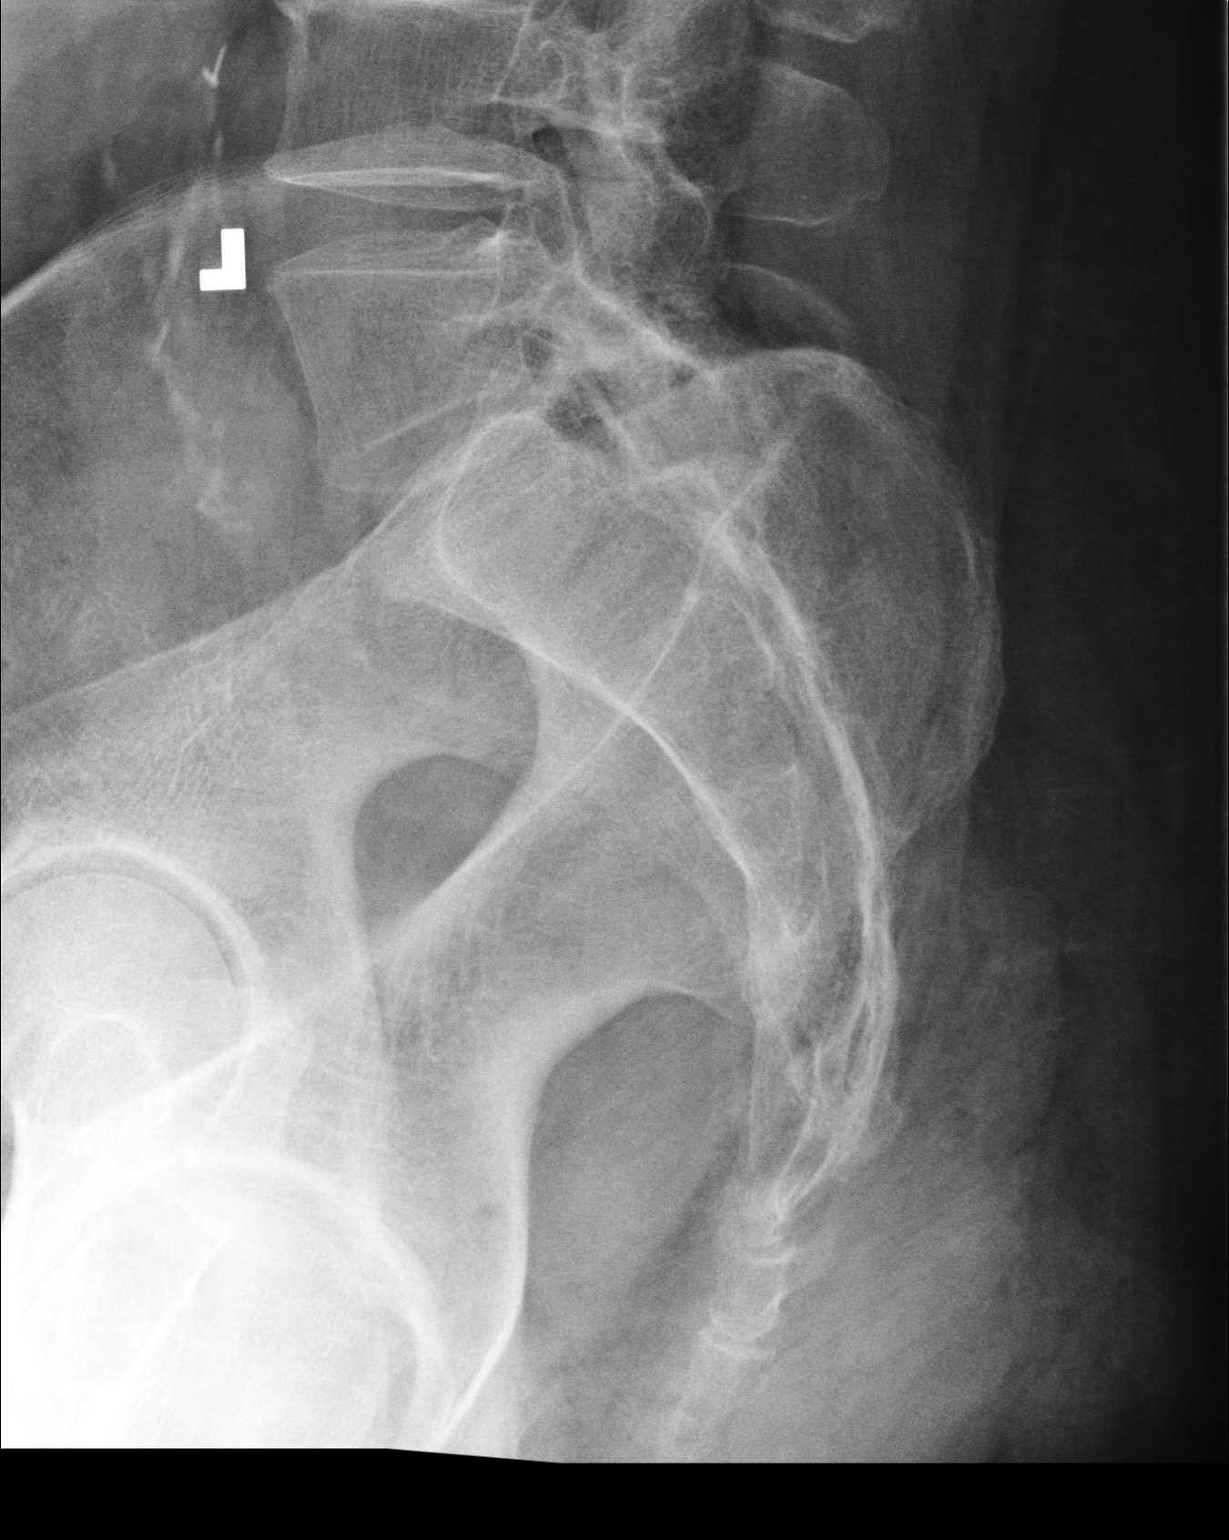

[3 of 3 positions shown; findings below may reference images not displayed]

FINDINGS: There is no evidence of fracture, dislocation or malalignment.
Multilevel degenerative disease changes are appreciated.
IMPRESSION: 1. No acute osseous abnormalities.
2. If there is persistent clinical concern or persistent complaints of pain,
further evaluation with MRI is recommended.

## 2012-05-12 IMAGING — CR DG CHEST 2V
1 series · 2 of 2 positions shown · non-contrast
Comparison: none

REASON FOR EXAM: chest pain and shortness of breath
COMMENTS:

PROCEDURE:     DXR - DXR CHEST PA (OR AP) AND LATERAL  - September 30, 2010  [DATE]
RESULT:     Comparison: 03/05/2010

[Series 1: view not recorded · 0.17mm/px · 2 of 2 slices shown]
[im 1/2]
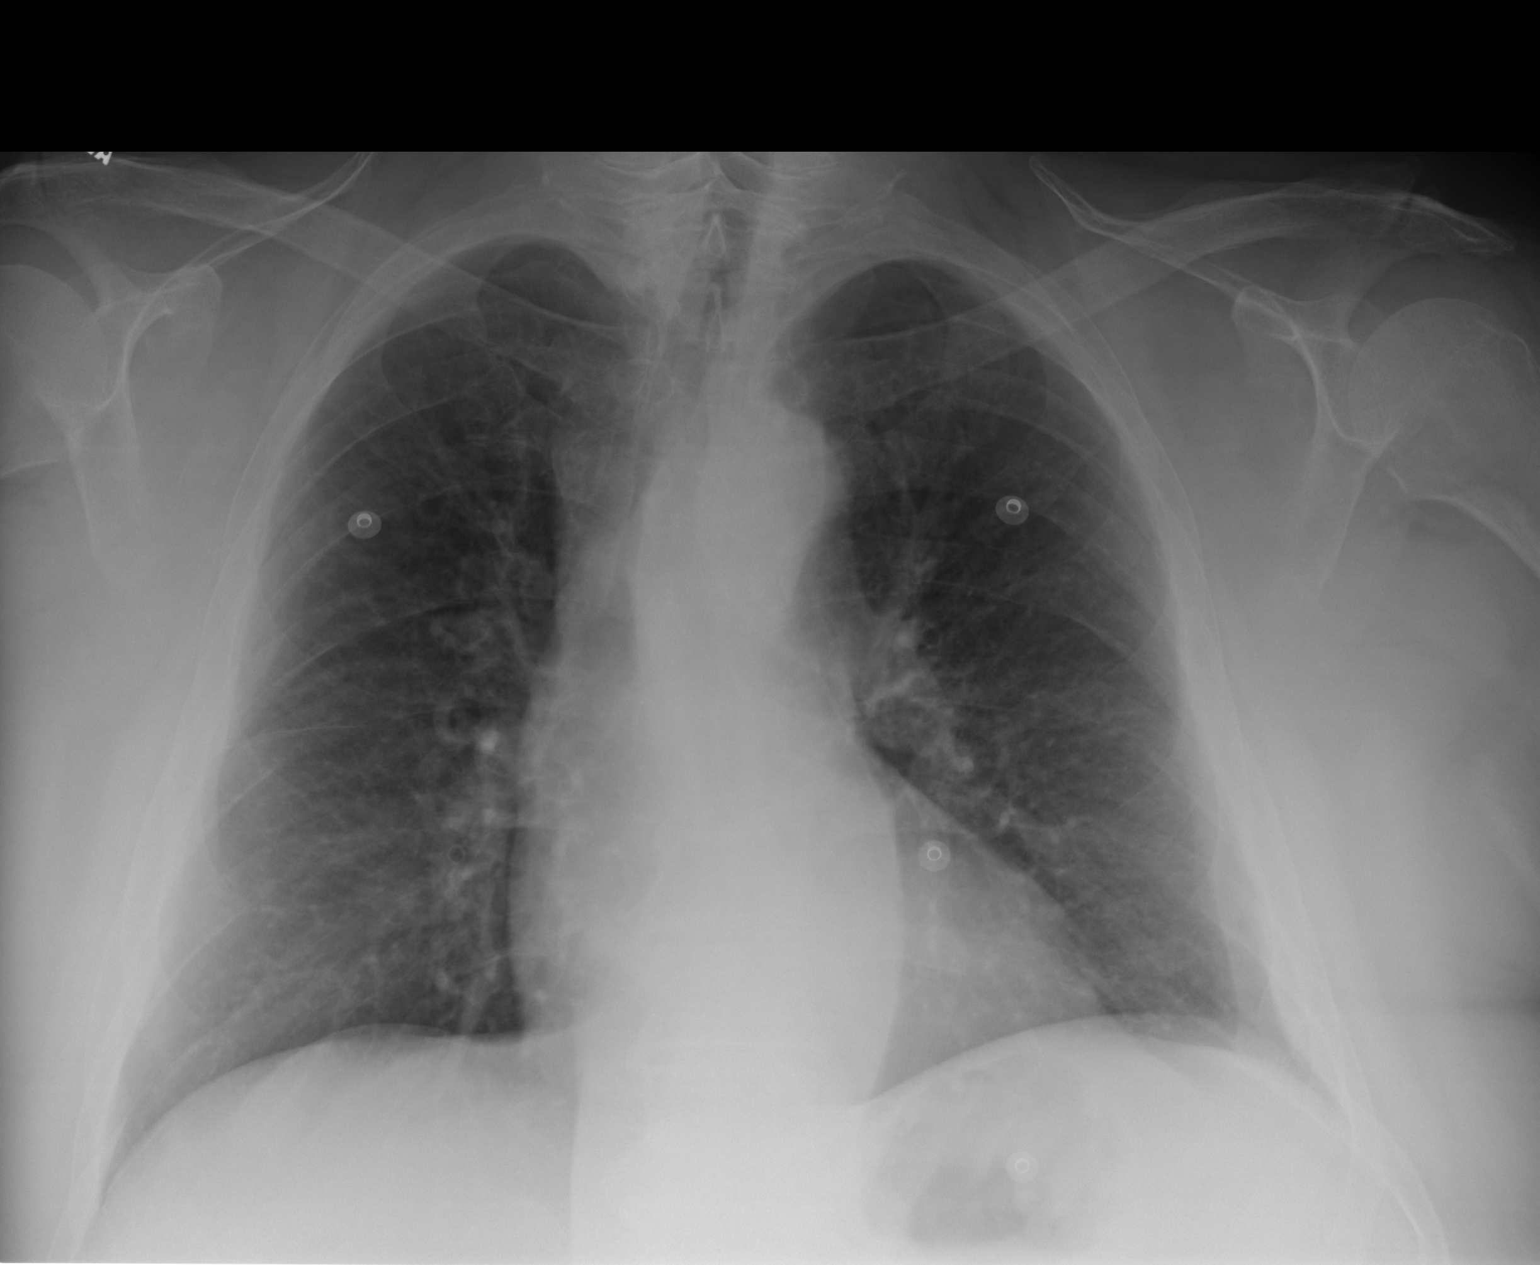
[im 2/2]
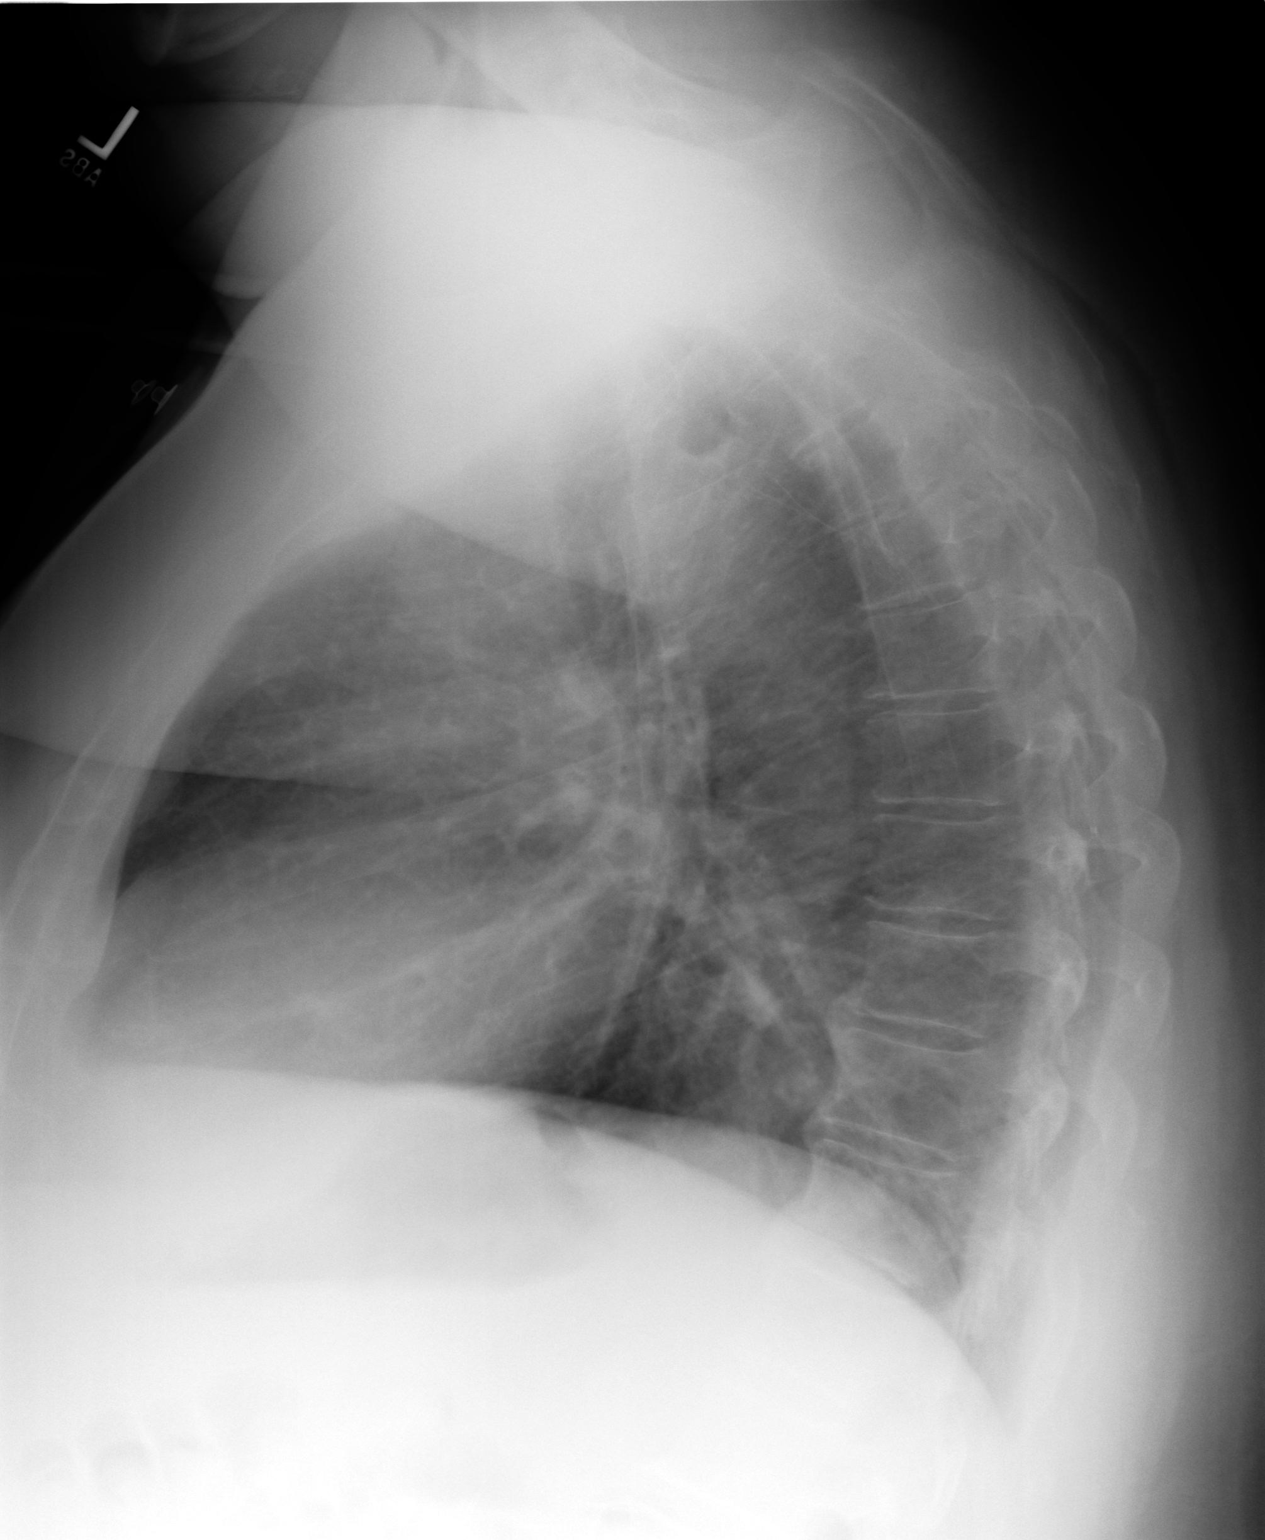

[2 of 2 positions shown; findings below may reference images not displayed]

FINDINGS: PA and lateral chest radiographs are provided.  There is no focal
parenchymal opacity, pleural effusion, or pneumothorax. The heart and
mediastinum are unremarkable.  The osseous structures are unremarkable.
IMPRESSION: No acute disease of the chest.

## 2012-05-12 IMAGING — CT CT CHEST W/ CM
1 series · 15 of 34 positions shown, 19 images · IV contrast (APPLIED)
Comparison: None

REASON FOR EXAM: PLEURITIC CHEST PAIN AND ELEVATED DDIMER
COMMENTS:

PROCEDURE:     CT  - CT CHEST (FOR PE) W  - September 30, 2010 [DATE]
RESULT:     Indications: Chest pain
TECHNIQUE: A thin-section spiral CT from the lung apices to the upper
abdomen was acquired on a multi slice scanner following 100ml 2sovue-ARN
intravenous contrast. These images were then transferred to the Siemens work
station and were subsequently reviewed utilizing 3-D reconstructions and MIP
images.

[Series 6: soft tissue · axial · 0.85mm/px · z∈[+53,+296]mm · 15 of 95 slices shown, 19 images]
[im 7/95  mediastinal]
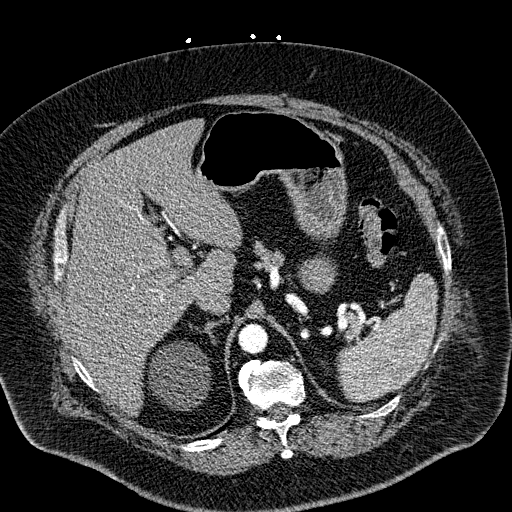
[im 7/95  lung]
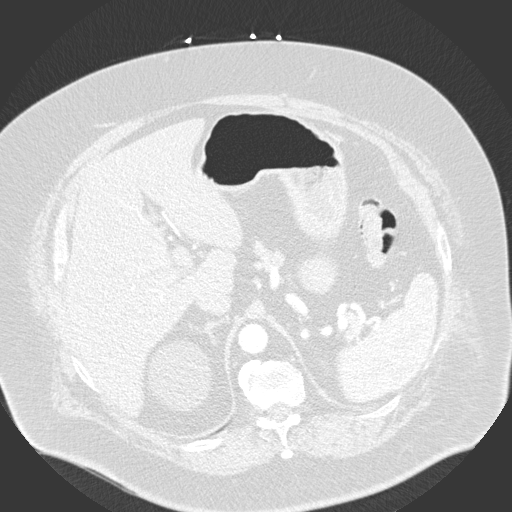
[im 14/95  lung]
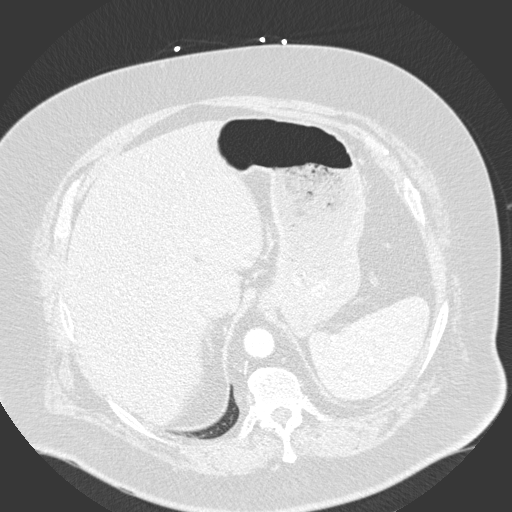
[im 19/95  lung]
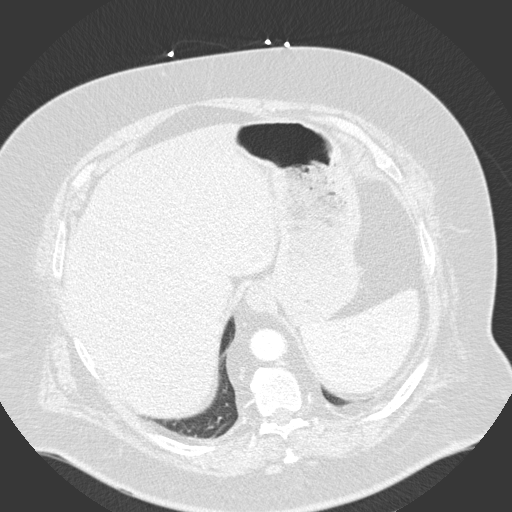
[im 25/95  lung]
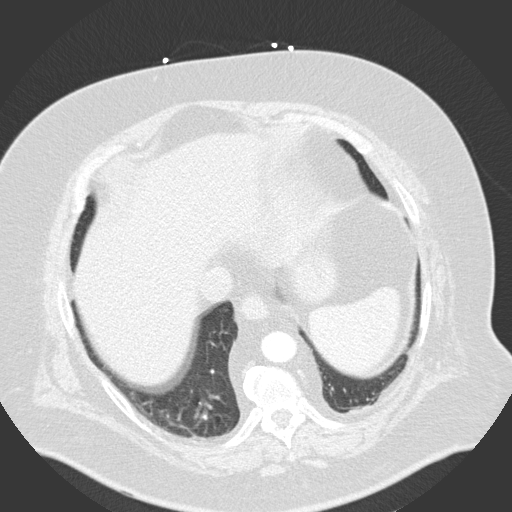
[im 32/95  mediastinal]
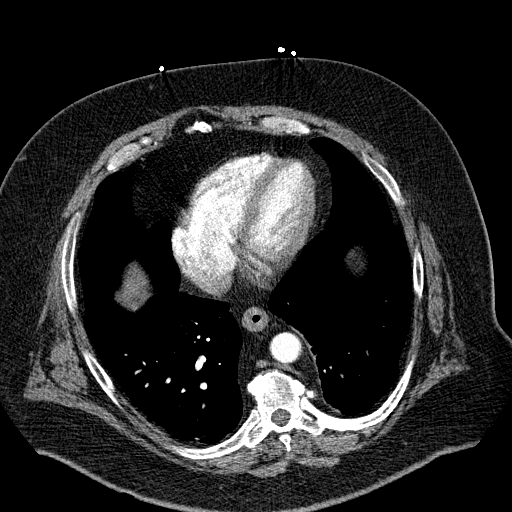
[im 32/95  lung]
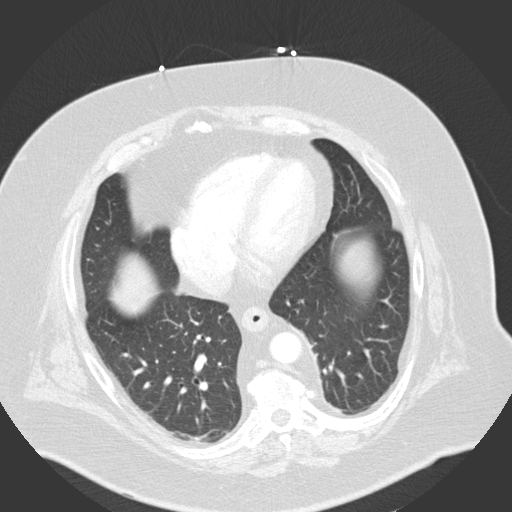
[im 38/95  lung]
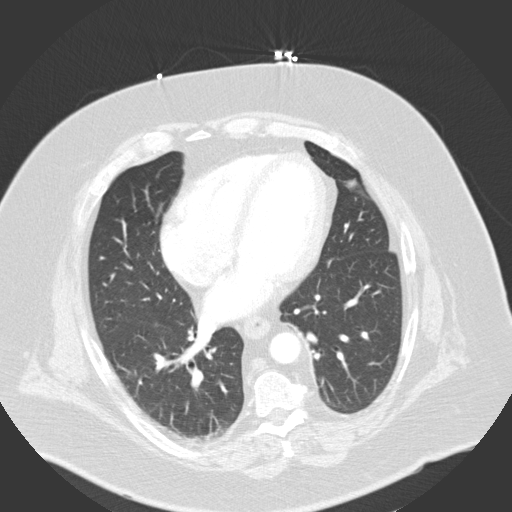
[im 42/95  lung]
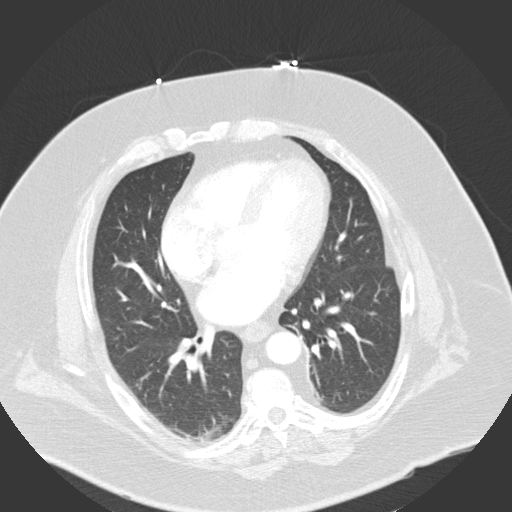
[im 49/95  lung]
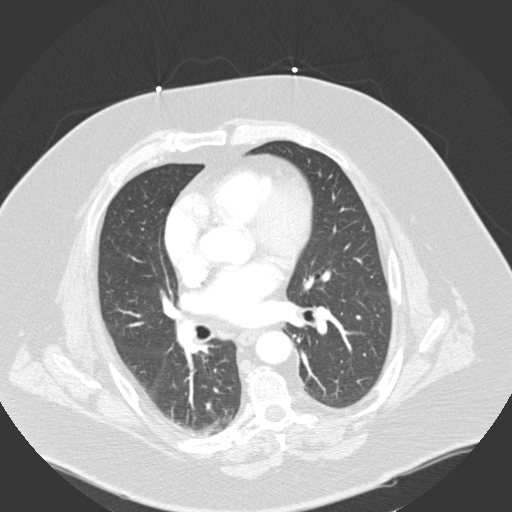
[im 53/95  mediastinal]
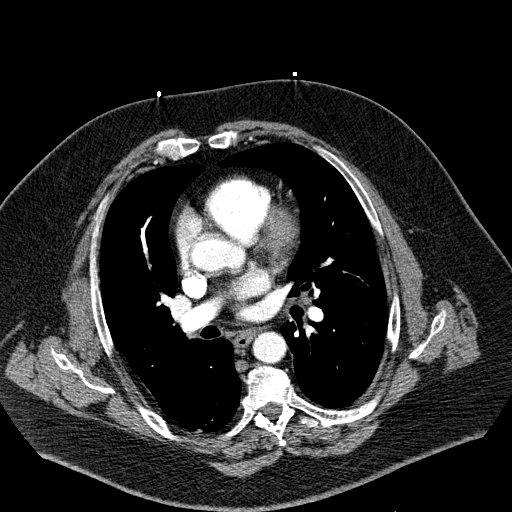
[im 53/95  lung]
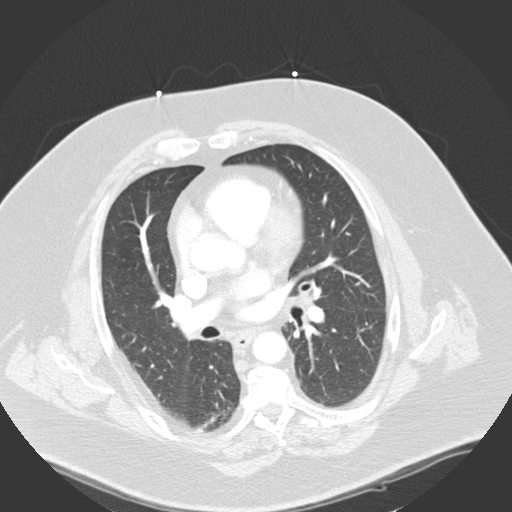
[im 57/95  lung]
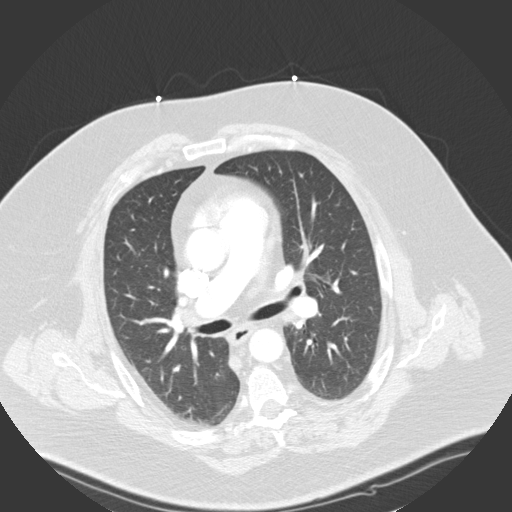
[im 63/95  lung]
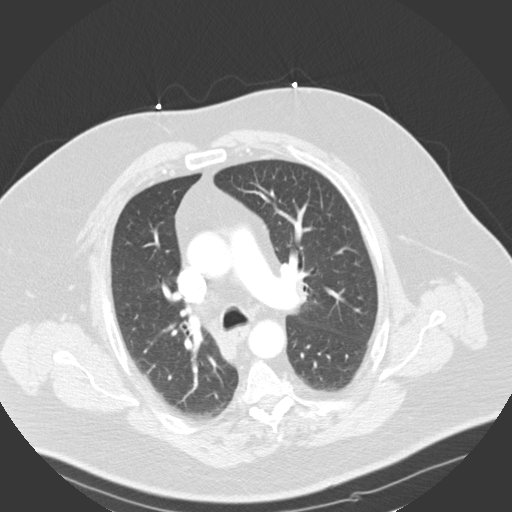
[im 70/95  lung]
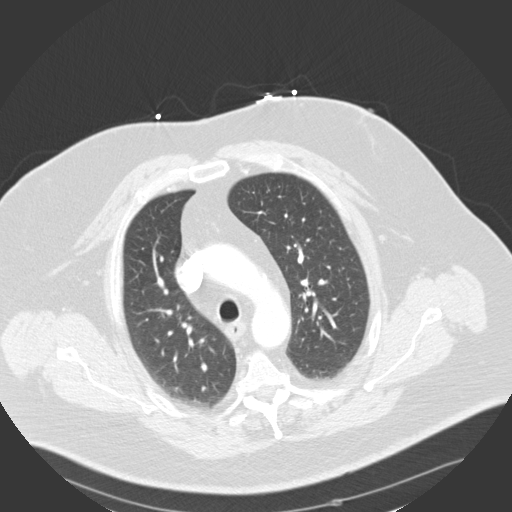
[im 76/95  mediastinal]
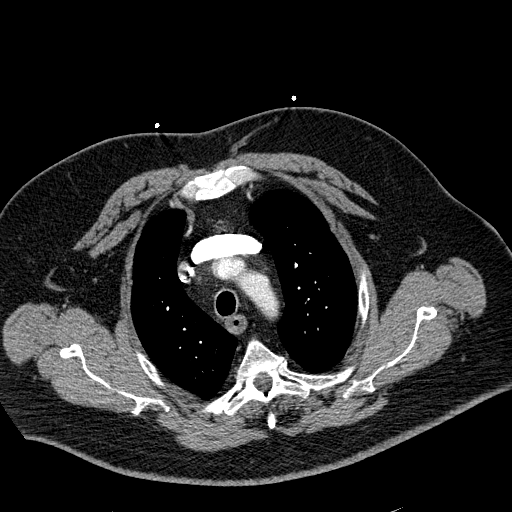
[im 76/95  lung]
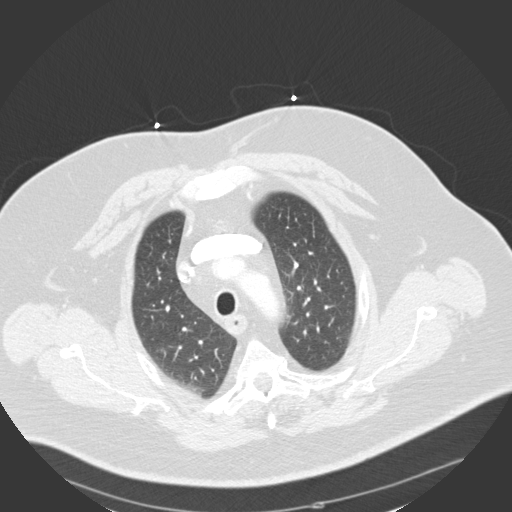
[im 81/95  lung]
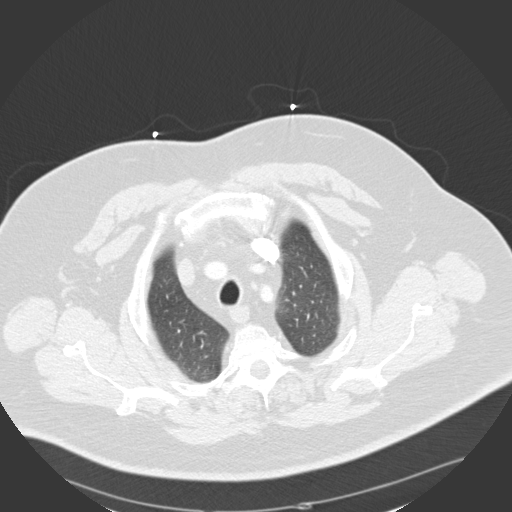
[im 88/95  lung]
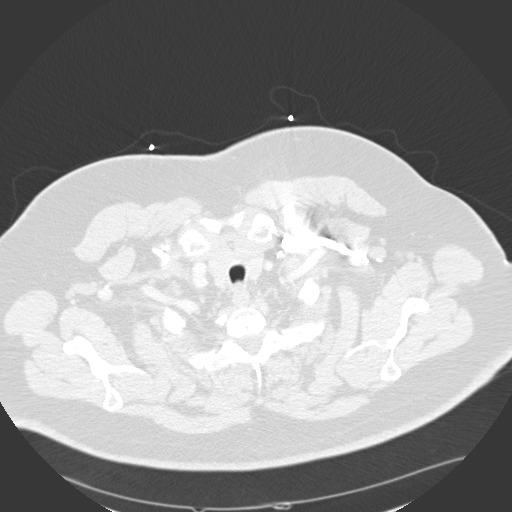

[15 of 34 positions shown; findings below may reference images not displayed]

FINDINGS: There is adequate opacification of the pulmonary arteries. There is no
pulmonary embolus. The main pulmonary artery, right main pulmonary artery,
and left main pulmonary arteries are normal in size. The heart size is
normal. There is no pericardial effusion.

The lungs are clear. There is no focal consolidation, pleural effusion, or
pneumothorax.

There is no axillary, hilar, or mediastinal adenopathy.

The osseous structures are unremarkable.

There is a 4.3 cm indeterminate left adrenal mass. Adjacently there is a 1
cm determine left renal mass.
IMPRESSION: 1. No CT evidence of pulmonary embolus.

2. There is a 4.3 cm indeterminate, incompletely visualized left adrenal
mass. Adjacently there is a 1 cm determine left renal mass. The overall
appearance is unchanged compared with 07/15/2008.

## 2012-05-15 IMAGING — CR DG CHEST 2V
1 series · 2 of 2 positions shown · non-contrast
Comparison: none

REASON FOR EXAM: sob
COMMENTS:

[Series 1: view not recorded · 0.17mm/px · 2 of 2 slices shown]
[im 1/2]
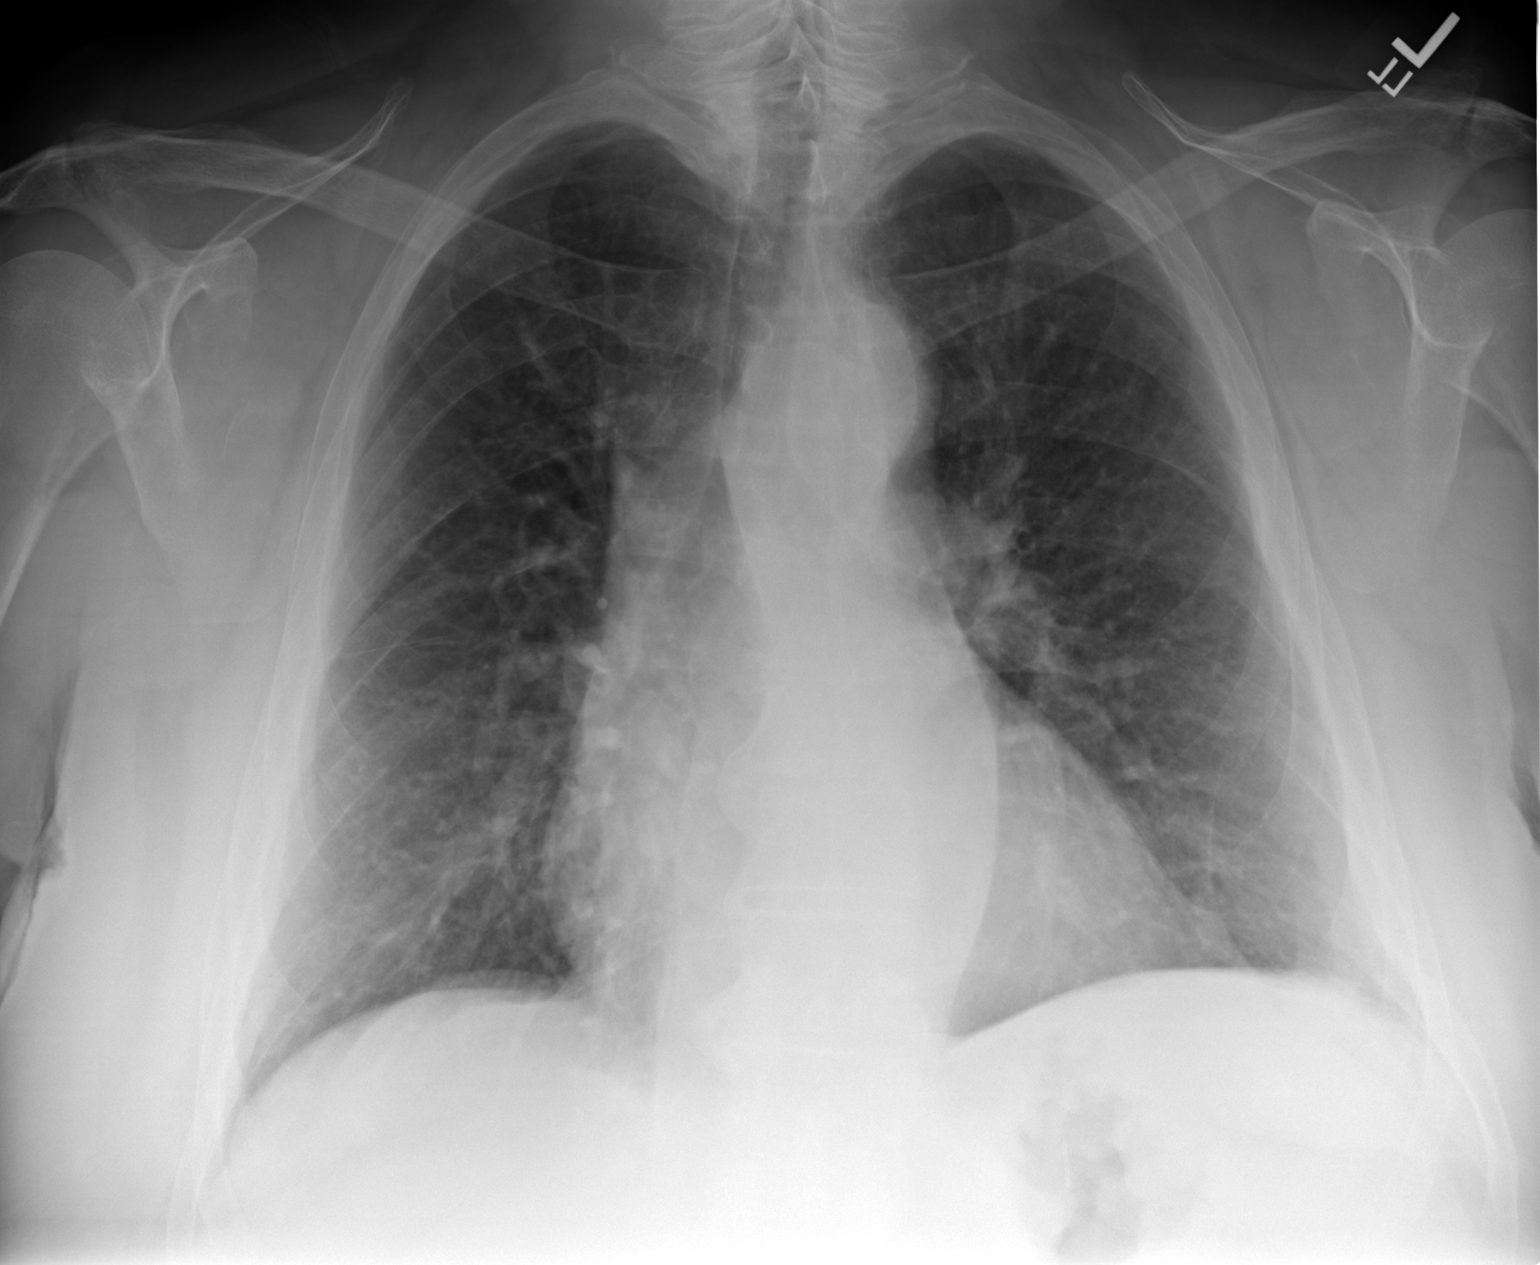
[im 2/2]
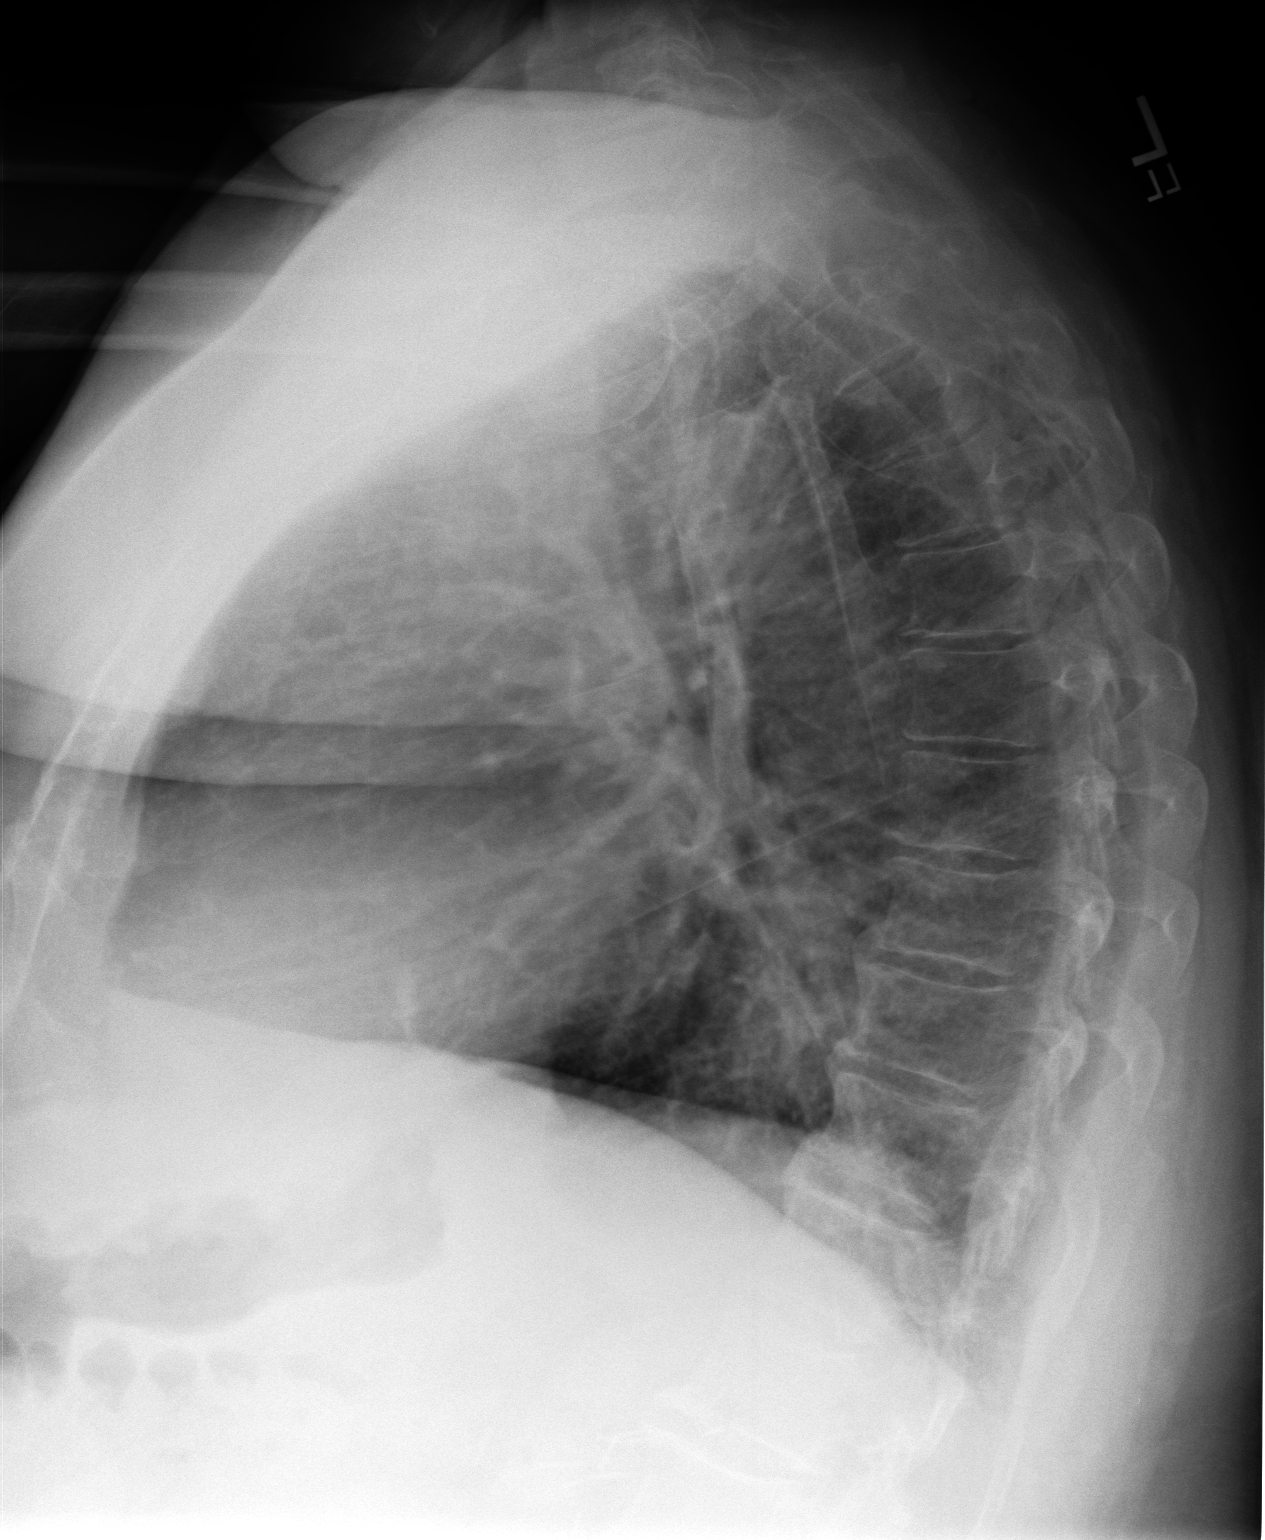

[2 of 2 positions shown; findings below may reference images not displayed]

PROCEDURE:     DXR - DXR CHEST PA (OR AP) AND LATERAL  - October 03, 2010  [DATE]

RESULT:     Comparison is made to the study of 09/30/2010.

The lungs are mildly hyperinflated. The cardiac silhouette is borderline
enlarged. There is no definite edema, infiltrate, effusion or pneumothorax.
There does not appear to be significant interval change.
IMPRESSION: 1. COPD. Mild cardiomegaly. No acute cardiopulmonary disease evident.

## 2012-12-06 ENCOUNTER — Inpatient Hospital Stay: Payer: Self-pay | Admitting: Internal Medicine

## 2012-12-06 LAB — COMPREHENSIVE METABOLIC PANEL
Albumin: 3.4 g/dL (ref 3.4–5.0)
Alkaline Phosphatase: 145 U/L — ABNORMAL HIGH (ref 50–136)
BUN: 13 mg/dL (ref 7–18)
Bilirubin,Total: 0.5 mg/dL (ref 0.2–1.0)
Calcium, Total: 9.4 mg/dL (ref 8.5–10.1)
Chloride: 104 mmol/L (ref 98–107)
Co2: 22 mmol/L (ref 21–32)
Creatinine: 0.99 mg/dL (ref 0.60–1.30)
EGFR (African American): >60
EGFR (Non-African Amer.): >60
SGOT(AST): 52 U/L — ABNORMAL HIGH (ref 15–37)
Sodium: 136 mmol/L (ref 136–145)
Total Protein: 7.6 g/dL (ref 6.4–8.2)

## 2012-12-06 LAB — CBC
HCT: 45.3 % (ref 40.0–52.0)
HGB: 15.3 g/dL (ref 13.0–18.0)
MCHC: 33.8 g/dL (ref 32.0–36.0)
RBC: 5.13 10*6/uL (ref 4.40–5.90)
WBC: 16.4 10*3/uL — ABNORMAL HIGH (ref 3.8–10.6)

## 2012-12-06 LAB — HEMOGLOBIN A1C: Hemoglobin A1C: 6.9 % — ABNORMAL HIGH (ref 4.2–6.3)

## 2012-12-06 LAB — URINALYSIS, COMPLETE
Bacteria: NONE SEEN
Bilirubin,UR: NEGATIVE
Glucose,UR: >=500 mg/dL (ref 0–75)
Ph: 6 (ref 4.5–8.0)
Specific Gravity: 1.01 (ref 1.003–1.030)
Squamous Epithelial: NONE SEEN
WBC UR: 1 /HPF (ref 0–5)

## 2012-12-06 LAB — ETHANOL
Ethanol %: <0.003 % (ref 0.000–0.080)
Ethanol: <3 mg/dL

## 2012-12-06 LAB — LIPASE, BLOOD: Lipase: 1109 U/L — ABNORMAL HIGH (ref 73–393)

## 2012-12-07 LAB — BASIC METABOLIC PANEL
BUN: 12 mg/dL (ref 7–18)
EGFR (African American): >60
EGFR (Non-African Amer.): >60
Glucose: 111 mg/dL — ABNORMAL HIGH (ref 65–99)
Osmolality: 276 (ref 275–301)
Potassium: 3.6 mmol/L (ref 3.5–5.1)
Sodium: 138 mmol/L (ref 136–145)

## 2012-12-07 LAB — HEPATIC FUNCTION PANEL A (ARMC)
Albumin: 2.8 g/dL — ABNORMAL LOW (ref 3.4–5.0)
Bilirubin, Direct: <0.1 mg/dL (ref 0.00–0.20)
Bilirubin,Total: 0.5 mg/dL (ref 0.2–1.0)
SGOT(AST): 23 U/L (ref 15–37)
SGPT (ALT): 82 U/L — ABNORMAL HIGH (ref 12–78)
Total Protein: 6.8 g/dL (ref 6.4–8.2)

## 2012-12-07 LAB — CBC WITH DIFFERENTIAL/PLATELET
Basophil #: 0 10*3/uL (ref 0.0–0.1)
Eosinophil #: 0.1 10*3/uL (ref 0.0–0.7)
Eosinophil %: 0.5 %
Lymphocyte #: 1.3 10*3/uL (ref 1.0–3.6)
MCHC: 33.9 g/dL (ref 32.0–36.0)
MCV: 89 fL (ref 80–100)
Monocyte %: 5.6 %
Platelet: 163 10*3/uL (ref 150–440)
RBC: 4.64 10*6/uL (ref 4.40–5.90)
RDW: 13.4 % (ref 11.5–14.5)
WBC: 16.2 10*3/uL — ABNORMAL HIGH (ref 3.8–10.6)

## 2012-12-07 LAB — LIPID PANEL
Cholesterol: 137 mg/dL (ref 0–200)
HDL Cholesterol: 45 mg/dL (ref 40–60)
Ldl Cholesterol, Calc: 72 mg/dL (ref 0–100)
Triglycerides: 98 mg/dL (ref 0–200)
VLDL Cholesterol, Calc: 20 mg/dL (ref 5–40)

## 2012-12-08 LAB — CBC WITH DIFFERENTIAL/PLATELET
Basophil #: 0.1 10*3/uL (ref 0.0–0.1)
Basophil %: 0.6 %
Eosinophil #: 0.2 10*3/uL (ref 0.0–0.7)
Eosinophil %: 1.1 %
HCT: 40 % (ref 40.0–52.0)
HGB: 13.5 g/dL (ref 13.0–18.0)
Lymphocyte %: 12.8 %
MCHC: 33.8 g/dL (ref 32.0–36.0)
MCV: 89 fL (ref 80–100)
Neutrophil #: 10.9 10*3/uL — ABNORMAL HIGH (ref 1.4–6.5)
Neutrophil %: 78.4 %

## 2012-12-08 LAB — LIPASE, BLOOD: Lipase: 140 U/L (ref 73–393)

## 2012-12-09 LAB — COMPREHENSIVE METABOLIC PANEL
Albumin: 2.3 g/dL — ABNORMAL LOW (ref 3.4–5.0)
Bilirubin,Total: 0.5 mg/dL (ref 0.2–1.0)
Calcium, Total: 8.5 mg/dL (ref 8.5–10.1)
Creatinine: 0.7 mg/dL (ref 0.60–1.30)
EGFR (African American): >60
EGFR (Non-African Amer.): >60
Potassium: 3.4 mmol/L — ABNORMAL LOW (ref 3.5–5.1)
SGOT(AST): 14 U/L — ABNORMAL LOW (ref 15–37)
Sodium: 141 mmol/L (ref 136–145)

## 2012-12-09 LAB — LIPASE, BLOOD: Lipase: 155 U/L (ref 73–393)

## 2012-12-10 LAB — MAGNESIUM: Magnesium: 1.7 mg/dL — ABNORMAL LOW

## 2012-12-11 LAB — POTASSIUM: Potassium: 3.8 mmol/L (ref 3.5–5.1)

## 2012-12-11 LAB — MAGNESIUM: Magnesium: 1.8 mg/dL

## 2012-12-31 ENCOUNTER — Ambulatory Visit: Payer: Self-pay | Admitting: Surgery

## 2013-01-07 ENCOUNTER — Ambulatory Visit: Payer: Self-pay | Admitting: Surgery

## 2013-01-08 LAB — PATHOLOGY REPORT

## 2013-03-06 ENCOUNTER — Emergency Department: Payer: Self-pay | Admitting: Emergency Medicine

## 2013-03-06 LAB — URINALYSIS, COMPLETE
Bacteria: NONE SEEN
Bilirubin,UR: NEGATIVE
Blood: NEGATIVE
Ketone: NEGATIVE
Leukocyte Esterase: NEGATIVE
Nitrite: NEGATIVE
Ph: 5 (ref 4.5–8.0)
Protein: 75
RBC,UR: NONE SEEN /HPF (ref 0–5)
Squamous Epithelial: NONE SEEN
WBC UR: <1 /HPF (ref 0–5)

## 2013-03-06 LAB — COMPREHENSIVE METABOLIC PANEL
Albumin: 3.4 g/dL (ref 3.4–5.0)
Anion Gap: 8 (ref 7–16)
Bilirubin,Total: 0.4 mg/dL (ref 0.2–1.0)
Calcium, Total: 9.3 mg/dL (ref 8.5–10.1)
Chloride: 107 mmol/L (ref 98–107)
Co2: 23 mmol/L (ref 21–32)
Creatinine: 1.01 mg/dL (ref 0.60–1.30)
EGFR (Non-African Amer.): >60
Potassium: 3.9 mmol/L (ref 3.5–5.1)
SGPT (ALT): 24 U/L (ref 12–78)
Total Protein: 7.3 g/dL (ref 6.4–8.2)

## 2013-03-06 LAB — CBC
MCH: 31 pg (ref 26.0–34.0)
MCV: 90 fL (ref 80–100)
RBC: 4.58 10*6/uL (ref 4.40–5.90)

## 2013-03-06 LAB — LIPASE, BLOOD: Lipase: 341 U/L (ref 73–393)

## 2013-03-22 IMAGING — CR DG CHEST 2V
1 series · 2 of 2 positions shown · non-contrast
Comparison: none

REASON FOR EXAM: Shortness of Breath
COMMENTS:   May transport without cardiac monitor

[Series 1: w chest pa · 0.14mm/px · 2 of 2 slices shown]
[im 1/2]
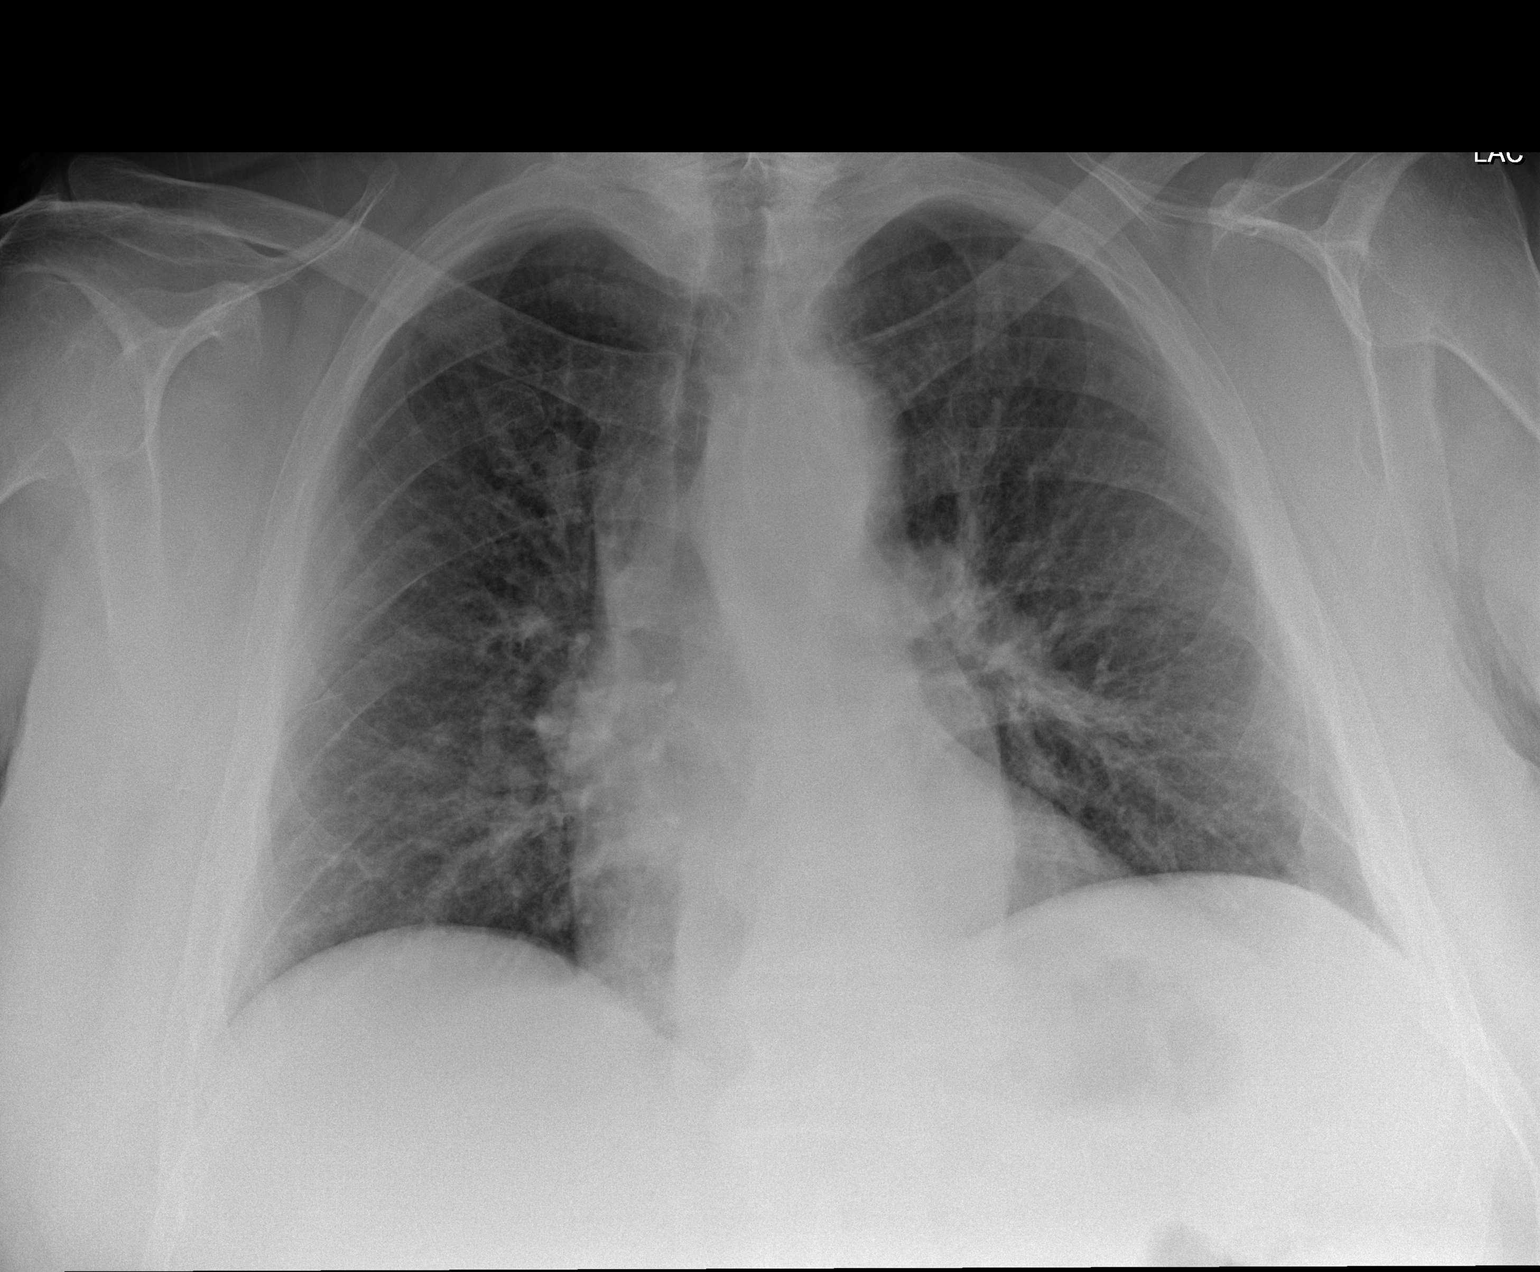
[im 2/2]
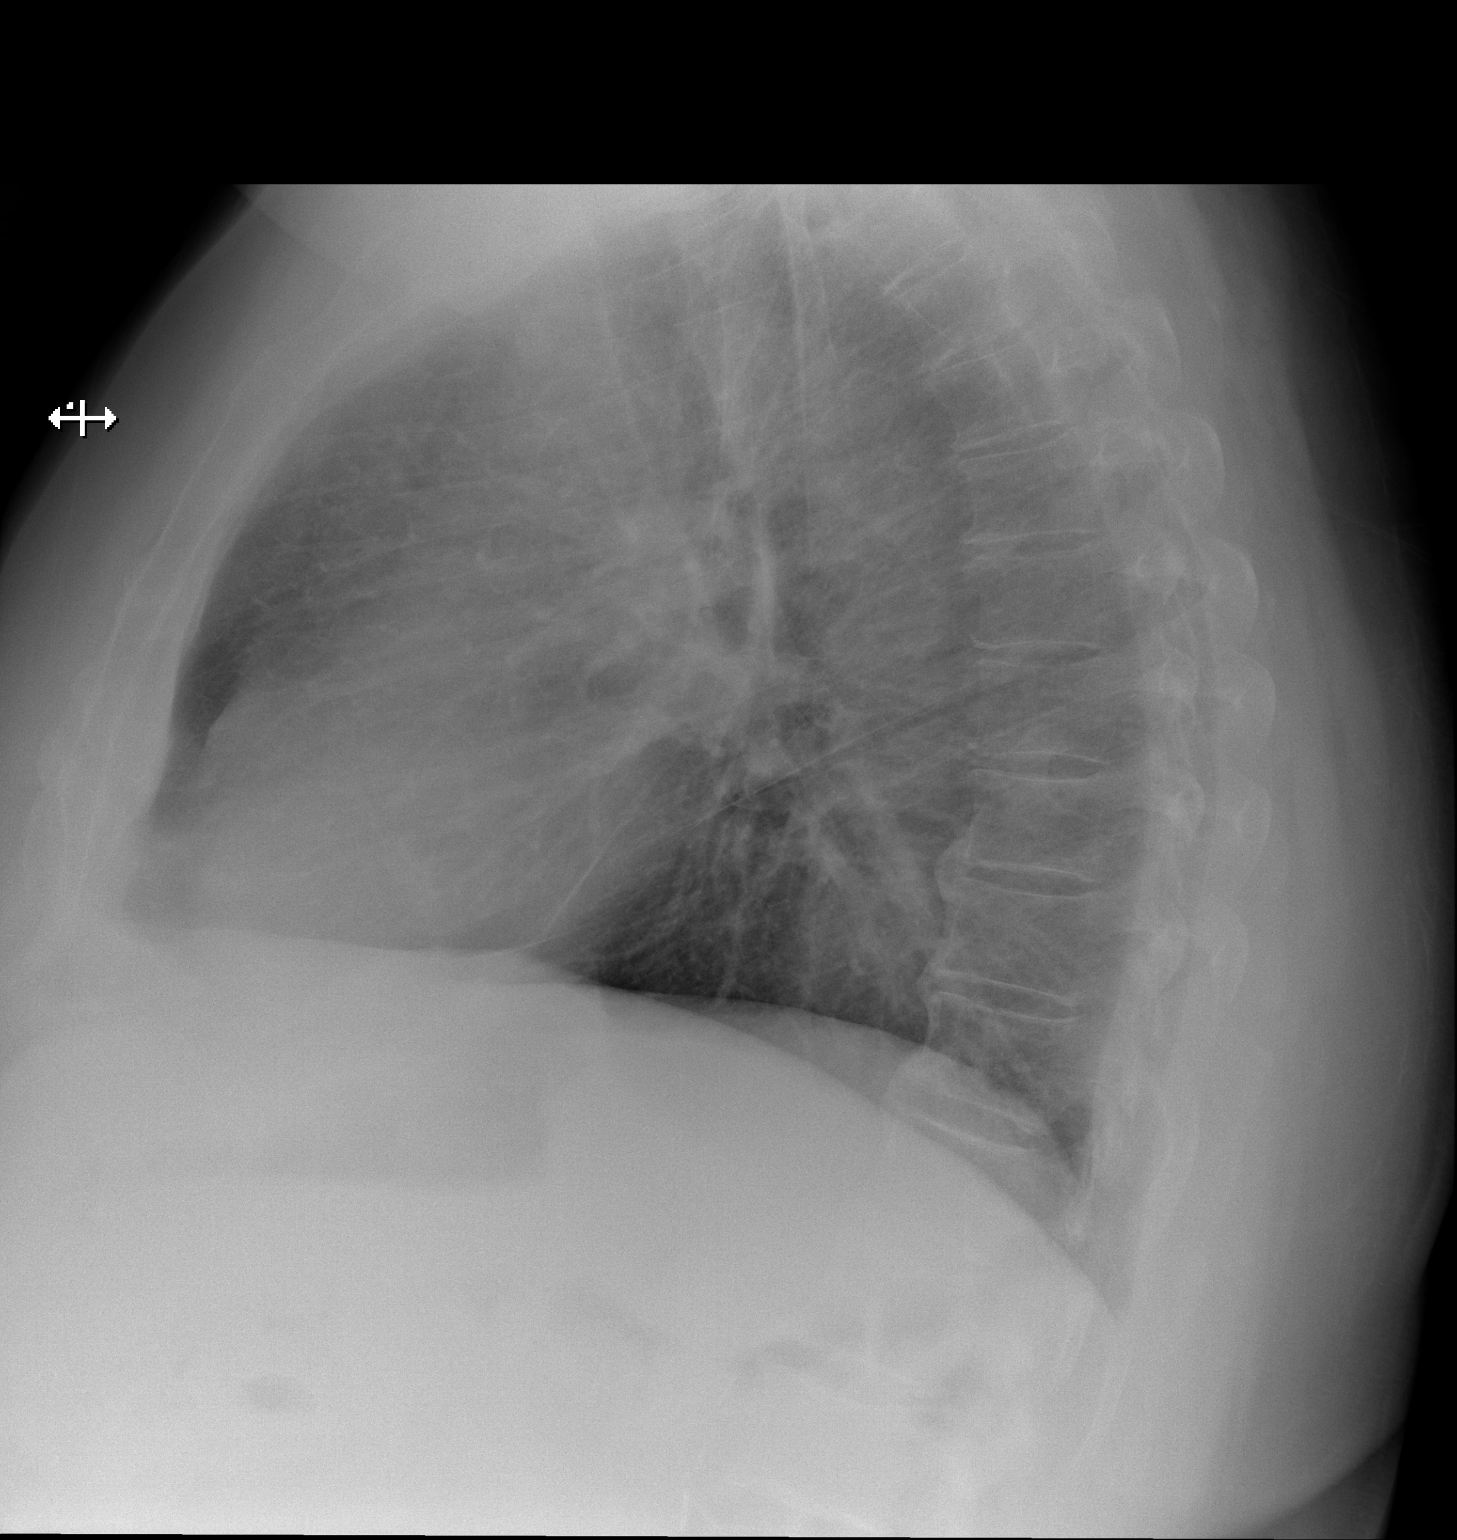

[2 of 2 positions shown; findings below may reference images not displayed]

PROCEDURE:     DXR - DXR CHEST PA (OR AP) AND LATERAL  - August 10, 2011  [DATE]

RESULT:     Comparison is made to the prior exam of 10/03/2010. There is
prominence of the pulmonary vascularity and increased thickening of the
vascular markings. The changes are minimal but could represent early
manifestation of CHF. Follow-up is recommended if such is clinically
indicated. Heart size remains normal. The costophrenic angles are
bilaterally sharp with no pleural effusion seen.
IMPRESSION: 1. No pneumonia is identified.
2. There is prominence of the pulmonary vascularity compatible with
pulmonary vascular congestion and with there being associated mild
interstitial thickening suggestive of early interstitial edema.
3. No frank pulmonary edema is seen.
4. No pneumonia is identified.

## 2013-03-22 IMAGING — CT CT CHEST W/ CM
1 series · 16 of 31 positions shown, 20 images · IV contrast (APPLIED)
Comparison: none

REASON FOR EXAM: cough, dyspnea
COMMENTS:

PROCEDURE:     CT  - CT CHEST (FOR PE) W  - August 10, 2011  [DATE]
RESULT:     History: Called.
Comparison Study: Chest CT of 09/30/2010.

[Series 7: soft tissue · axial · 0.92mm/px · z∈[-788,-500]mm · 16 of 104 slices shown, 20 images]
[im 4/104  mediastinal]
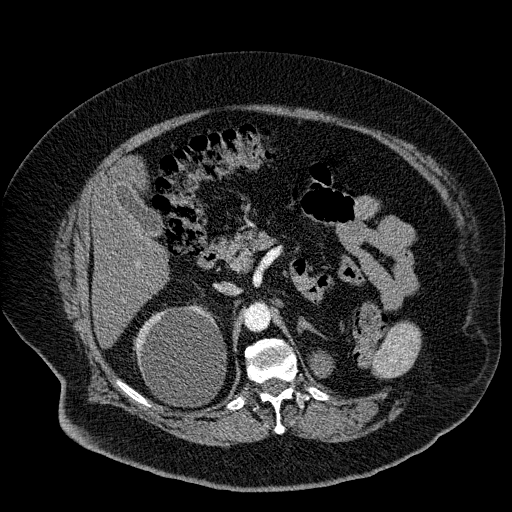
[im 4/104  lung]
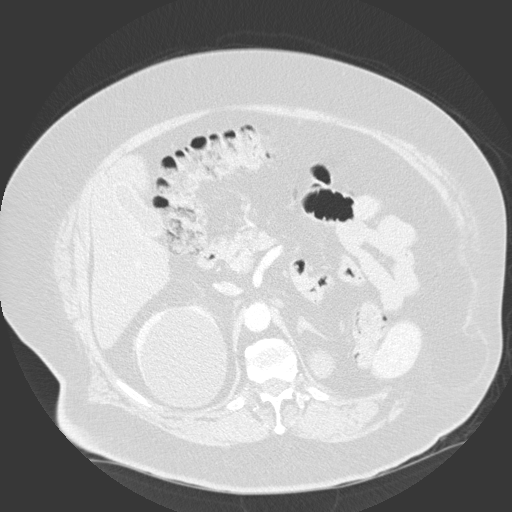
[im 12/104  lung]
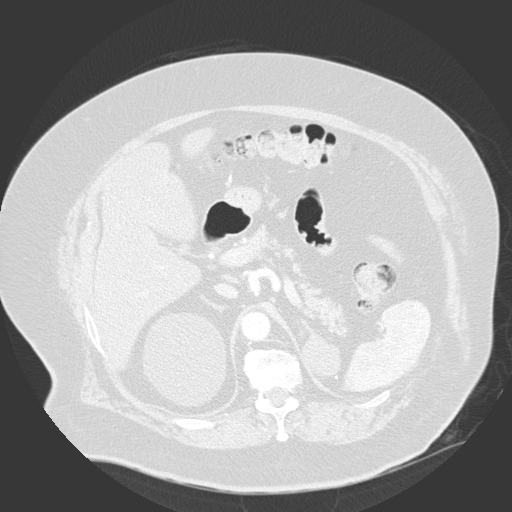
[im 20/104  lung]
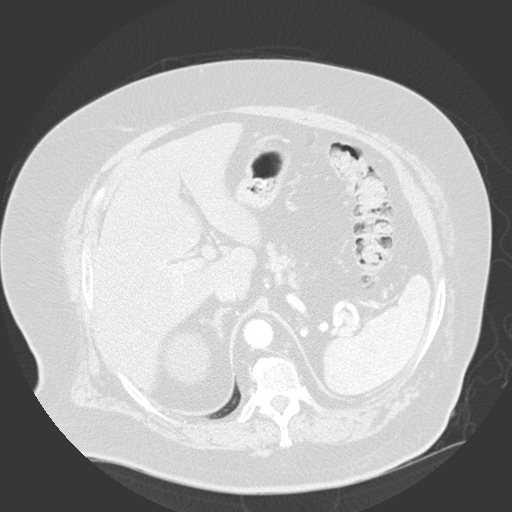
[im 23/104  lung]
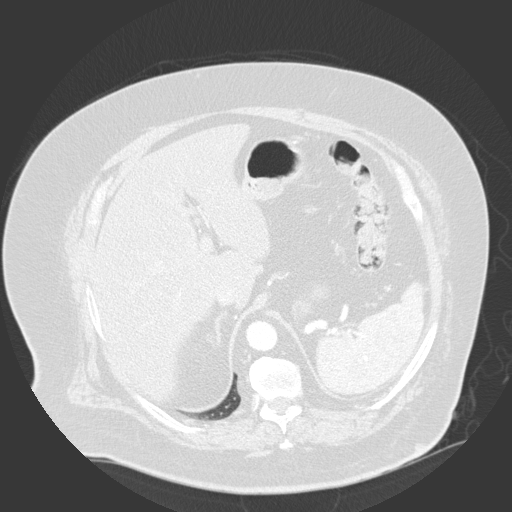
[im 31/104  mediastinal]
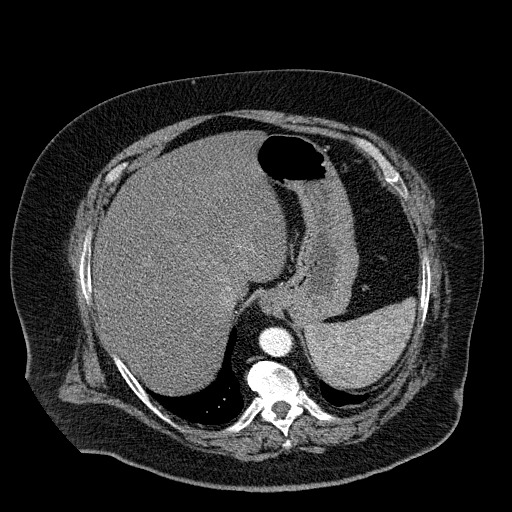
[im 31/104  lung]
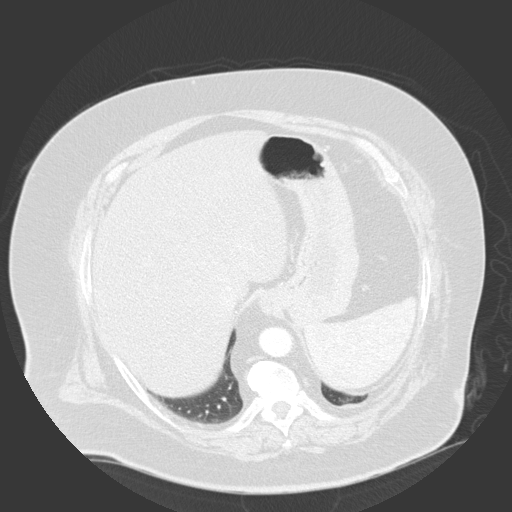
[im 35/104  lung]
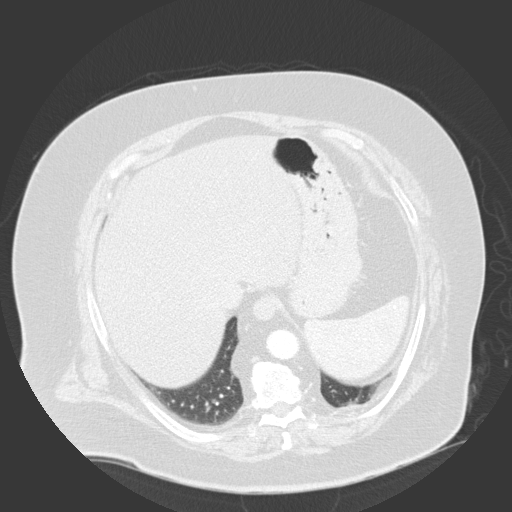
[im 42/104  lung]
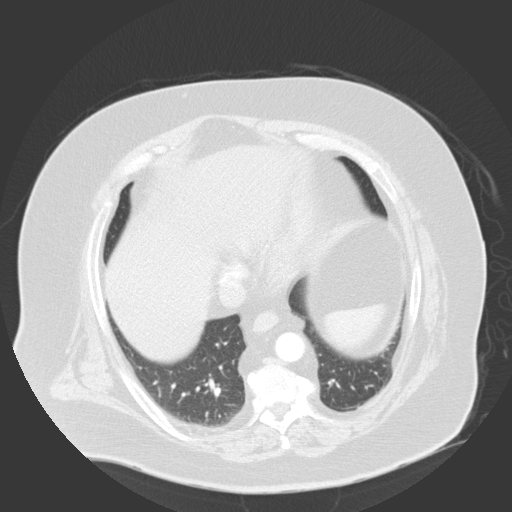
[im 50/104  lung]
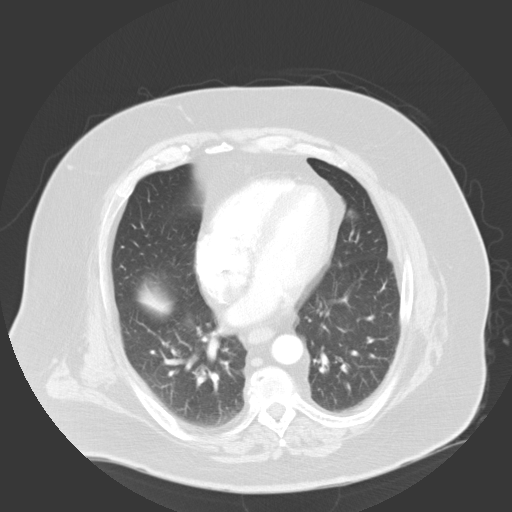
[im 55/104  mediastinal]
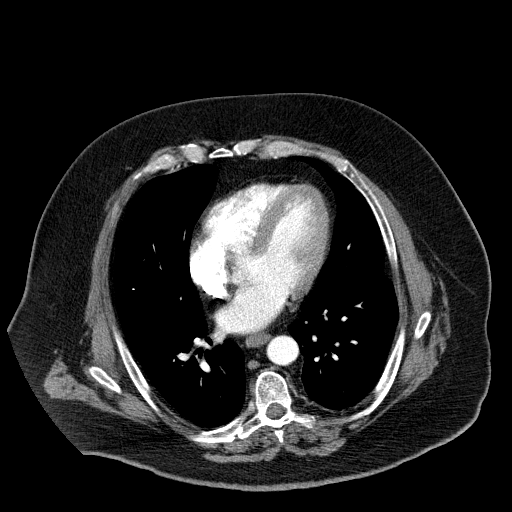
[im 55/104  lung]
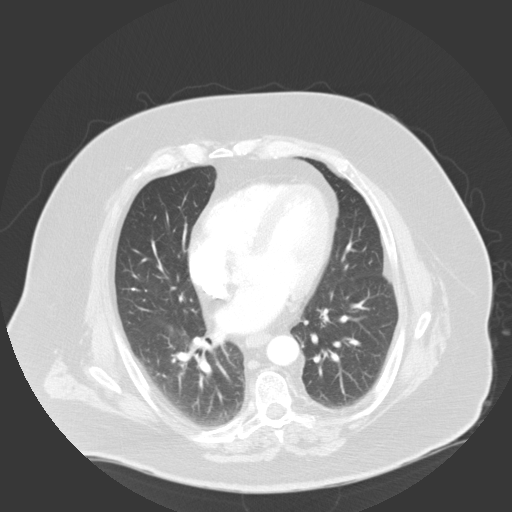
[im 62/104  lung]
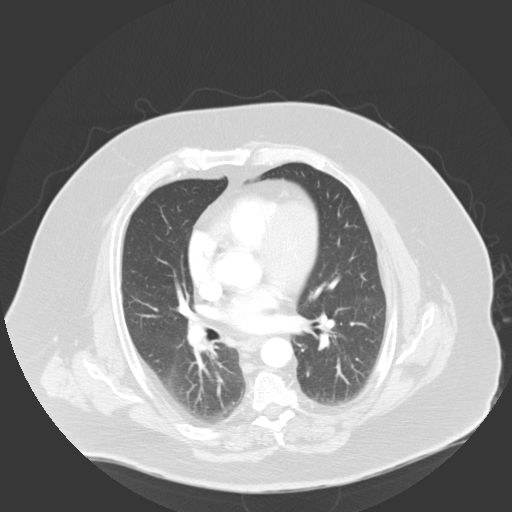
[im 69/104  lung]
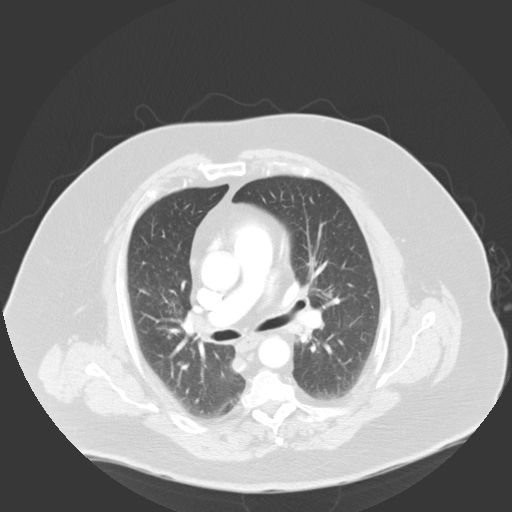
[im 73/104  lung]
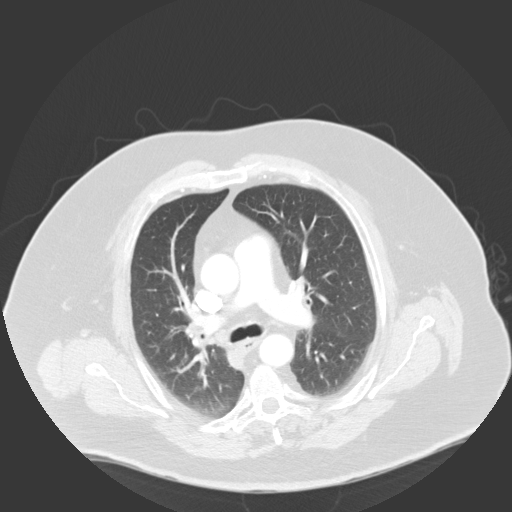
[im 81/104  mediastinal]
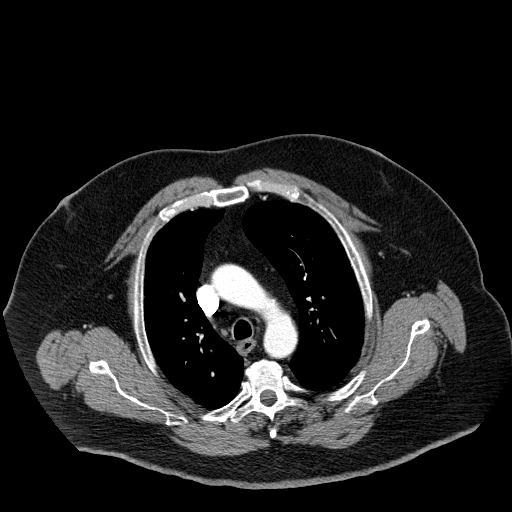
[im 81/104  lung]
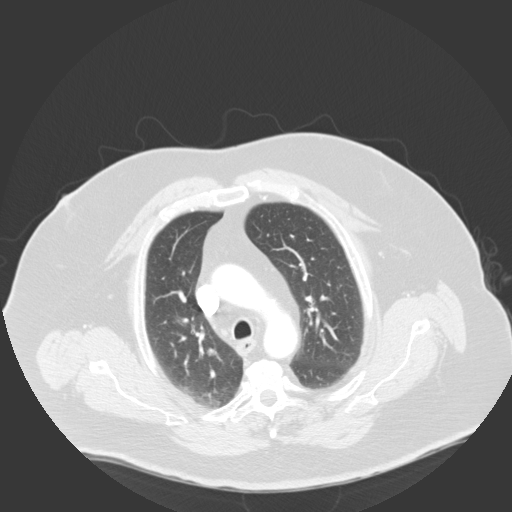
[im 84/104  lung]
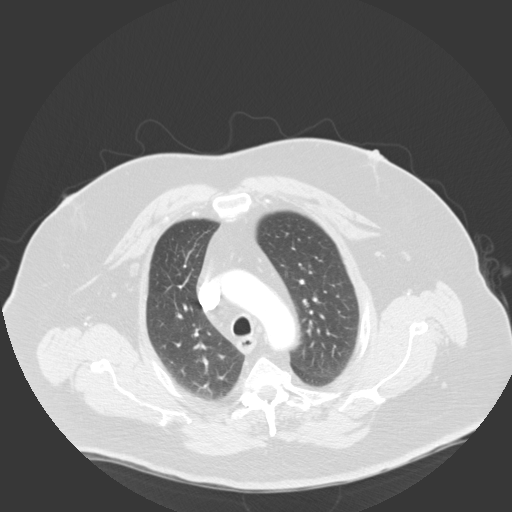
[im 92/104  lung]
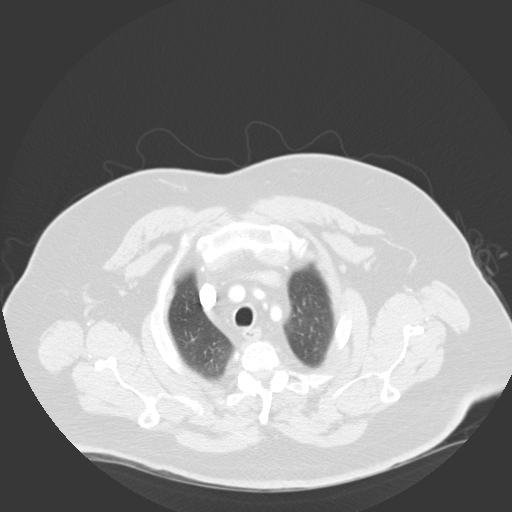
[im 100/104  lung]
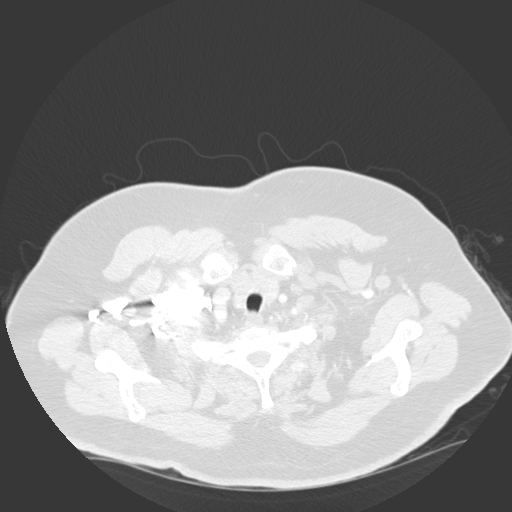

[16 of 31 positions shown; findings below may reference images not displayed]

FINDINGS: Standard CT obtained under cc of Ysovue-[id number] thoracic aorta is
unremarkable. Right adrenal is normal. 4.3 cm left adrenal mass is present.
This is unchanged. This could be benign or malignant adrenal lesion.
Pulmonary is are normal. Right renal cyst is noted. The cyst is large and
stable. Cyst measures approximately 8.7 cm. Calcifications in the spleen
consistent granulomas. Large airways are patent. Mild base atelectasis
present. Lungs are otherwise clear.
IMPRESSION: No evidence of pulmonary embolus. Stable large left adrenal
lesion. Adrenal lesion is unchanged from 09/30/2010, the lesion could be
benign or malignant.

## 2013-06-04 IMAGING — CT CT HEAD WITHOUT CONTRAST
2 series · 16 of 30 positions shown, 20 images · non-contrast
Comparison: none

REASON FOR EXAM: headache
COMMENTS:

[Series 2: without · axial · non-contrast · 0.43mm/px · z∈[+275,+405]mm · 13 of 32 slices shown, 17 images]
[im 3/32  brain]
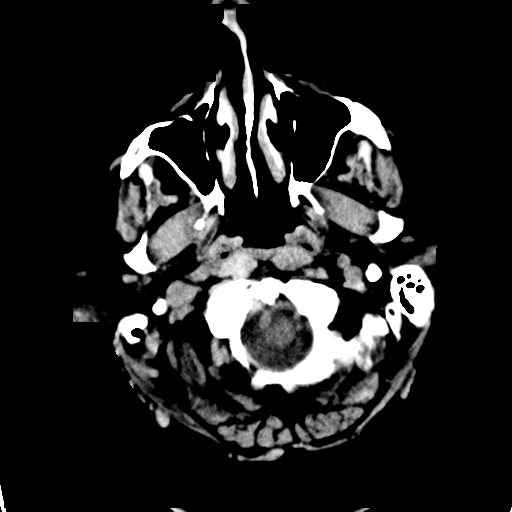
[im 3/32  bone]
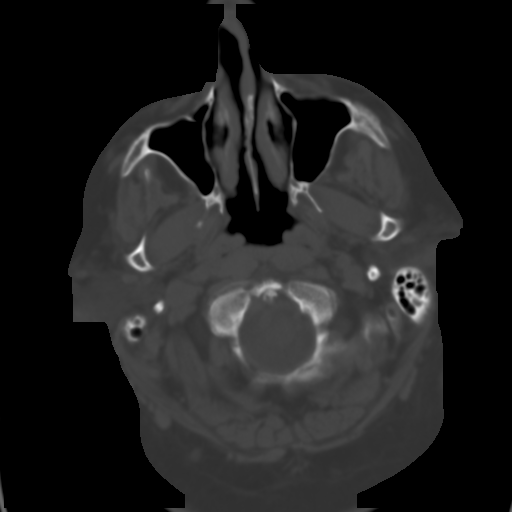
[im 5/32  brain]
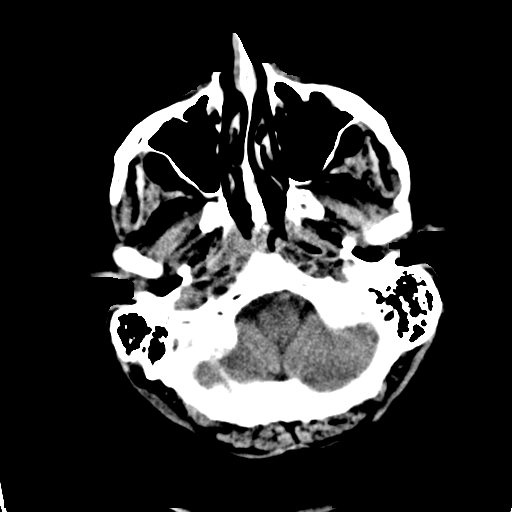
[im 7/32  brain]
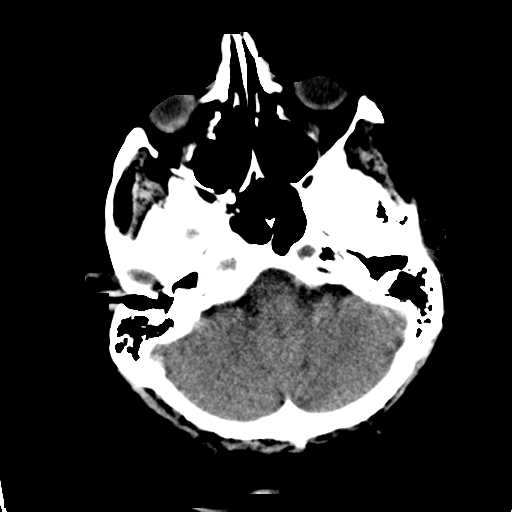
[im 9/32  brain]
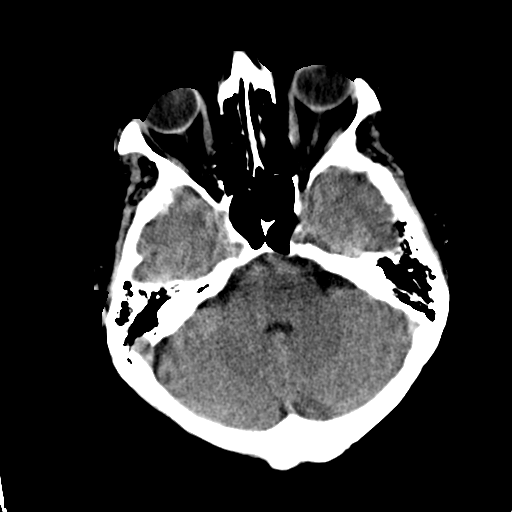
[im 12/32  brain]
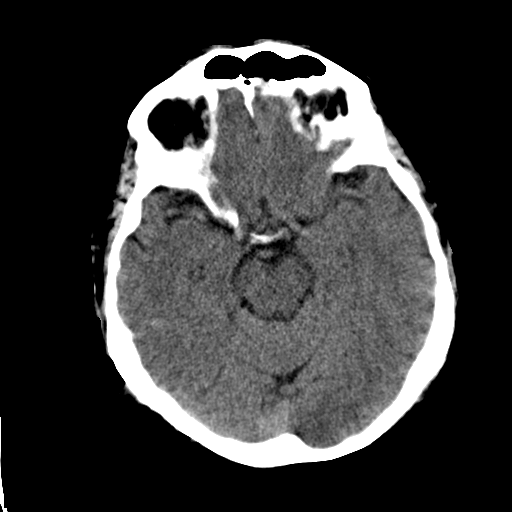
[im 12/32  bone]
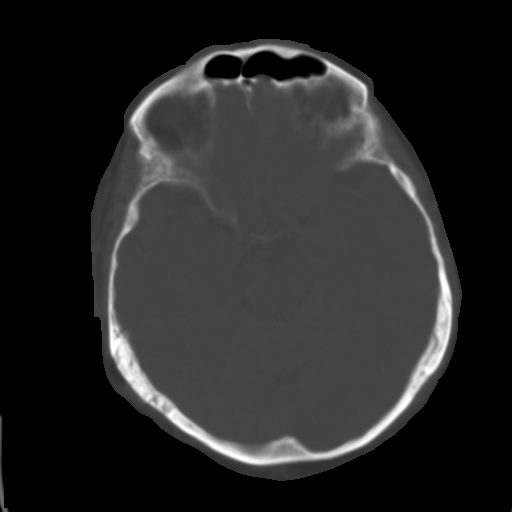
[im 14/32  brain]
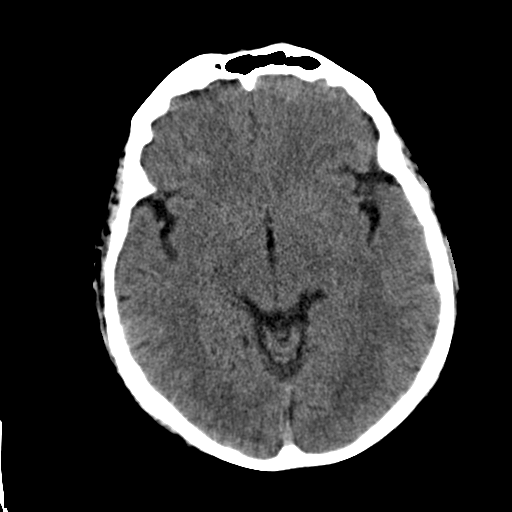
[im 16/32  brain]
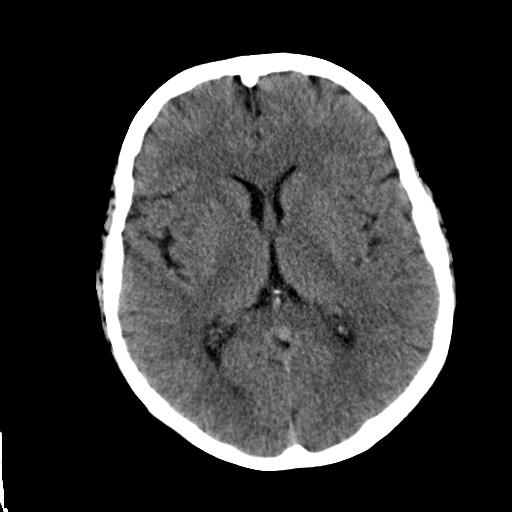
[im 18/32  brain]
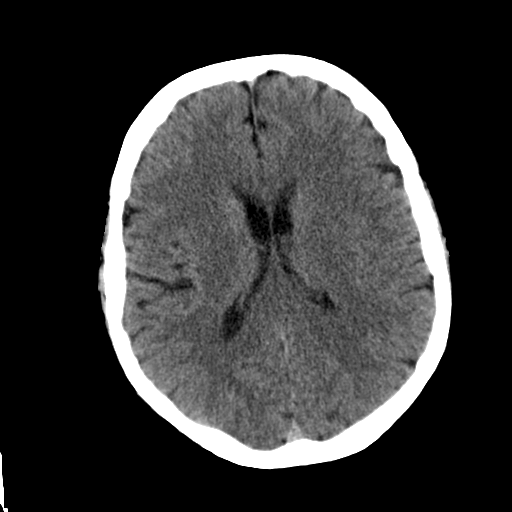
[im 20/32  brain]
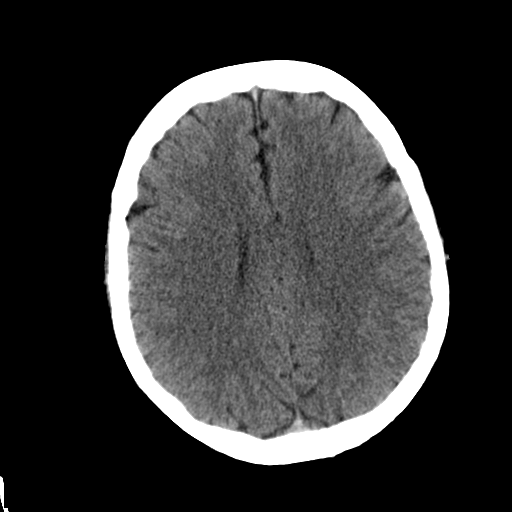
[im 20/32  bone]
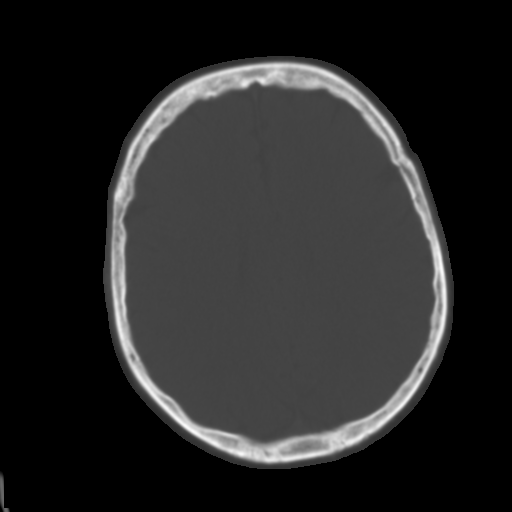
[im 23/32  brain]
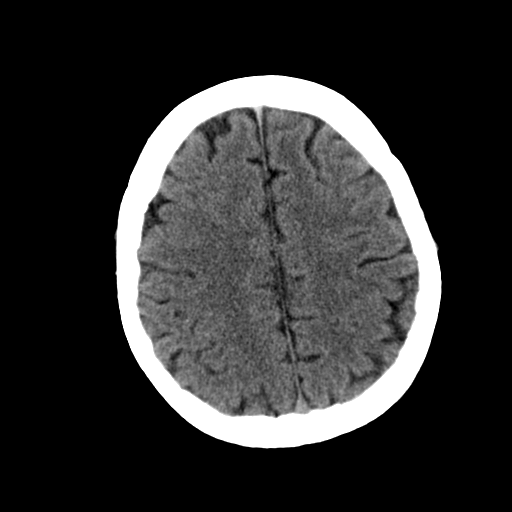
[im 25/32  brain]
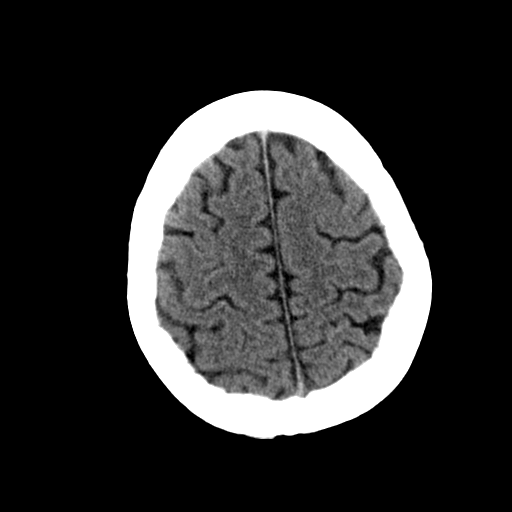
[im 27/32  brain]
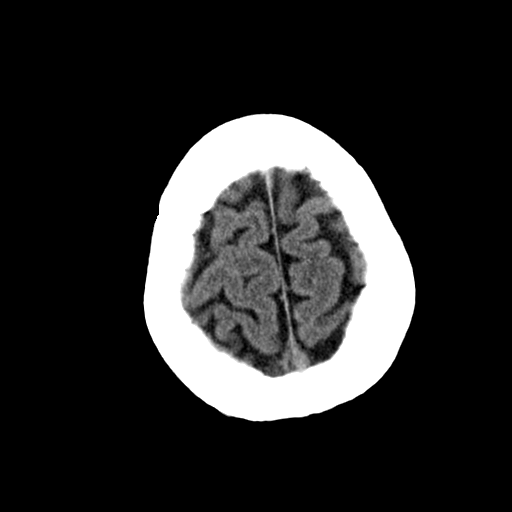
[im 29/32  brain]
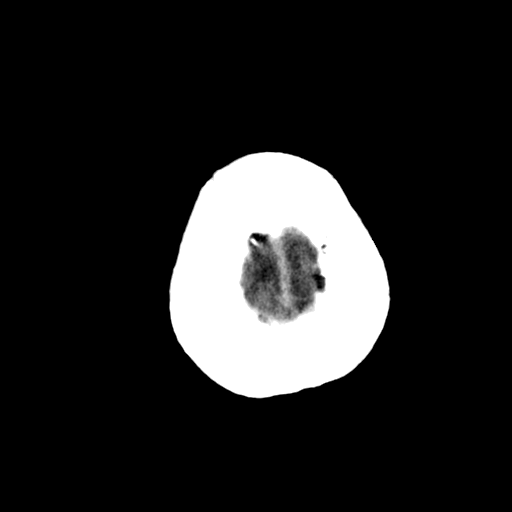
[im 29/32  bone]
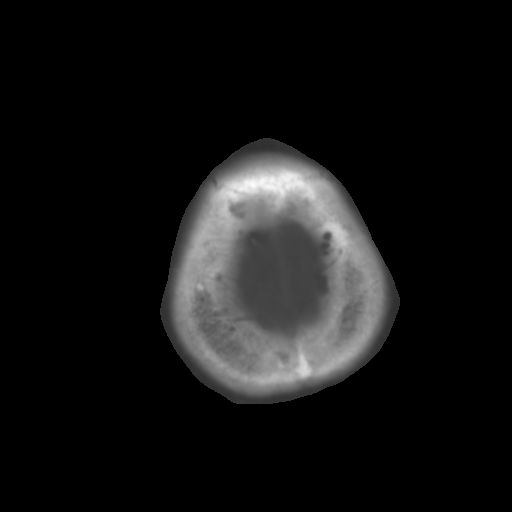

[Series 3: bone · axial · 0.43mm/px · z∈[+275,+320]mm · 3 of 32 slices shown]
[im 3/32  bone]
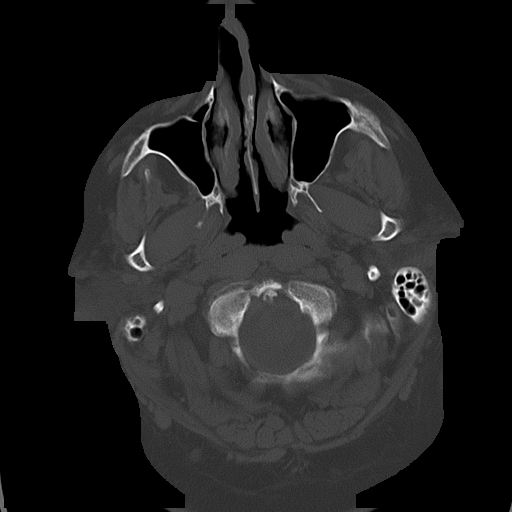
[im 7/32  bone]
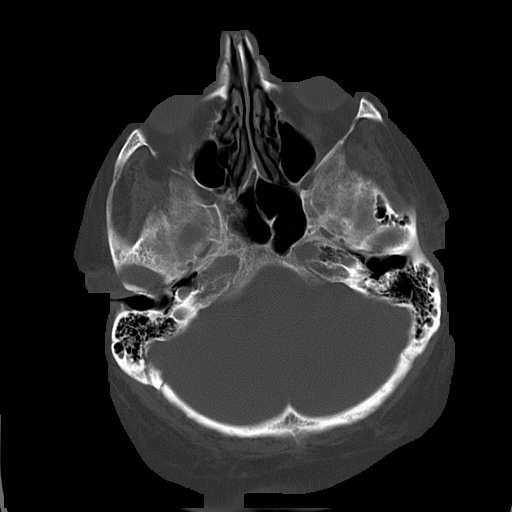
[im 12/32  bone]
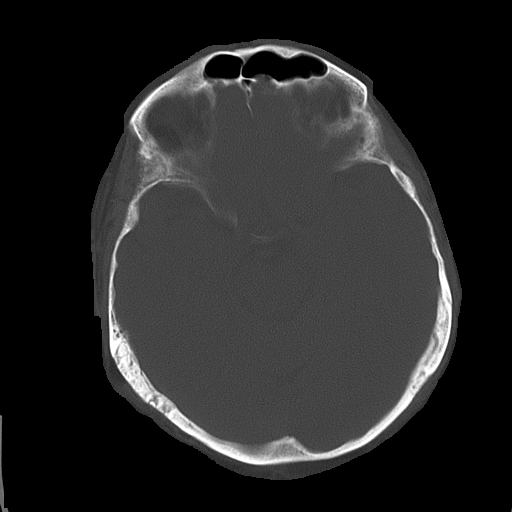

[16 of 30 positions shown; findings below may reference images not displayed]

PROCEDURE:     CT  - CT HEAD WITHOUT CONTRAST  - October 23, 2011 [DATE]

RESULT:     Emergent CT of the brain is compared to the previous exam dated
05 March, 2010.

The ventricles and sulci are normal. There is no hemorrhage. There is no
focal mass, mass-effect or midline shift. There is no evidence of edema or
territorial infarct. The bone windows demonstrate normal aeration of the
paranasal sinuses and mastoid air cells. There is no skull fracture
demonstrated.
IMPRESSION: 1. No acute intracranial abnormality. Stable appearance.

[REDACTED]

## 2013-09-30 ENCOUNTER — Emergency Department: Payer: Self-pay | Admitting: Emergency Medicine

## 2013-10-03 ENCOUNTER — Emergency Department: Payer: Self-pay | Admitting: Emergency Medicine

## 2014-01-31 ENCOUNTER — Ambulatory Visit: Payer: Self-pay | Admitting: Endocrinology

## 2014-04-06 ENCOUNTER — Emergency Department: Payer: Self-pay | Admitting: Internal Medicine

## 2014-07-19 IMAGING — CR DG ABDOMEN 3V
1 series · 6 of 6 positions shown · non-contrast
Comparison: none

REASON FOR EXAM: Pain
COMMENTS:   May transport without cardiac monitor

[Series 1: pa · 0.17mm/px · 6 of 6 slices shown]
[im 1/6]
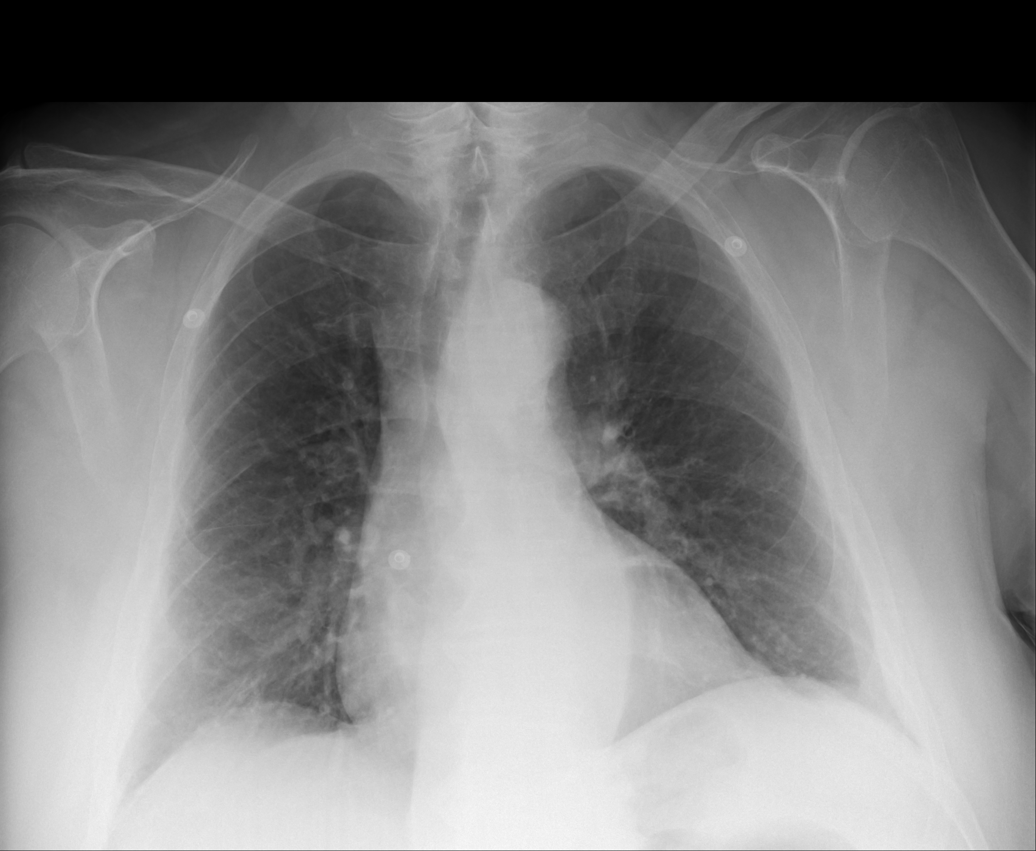
[im 2/6]
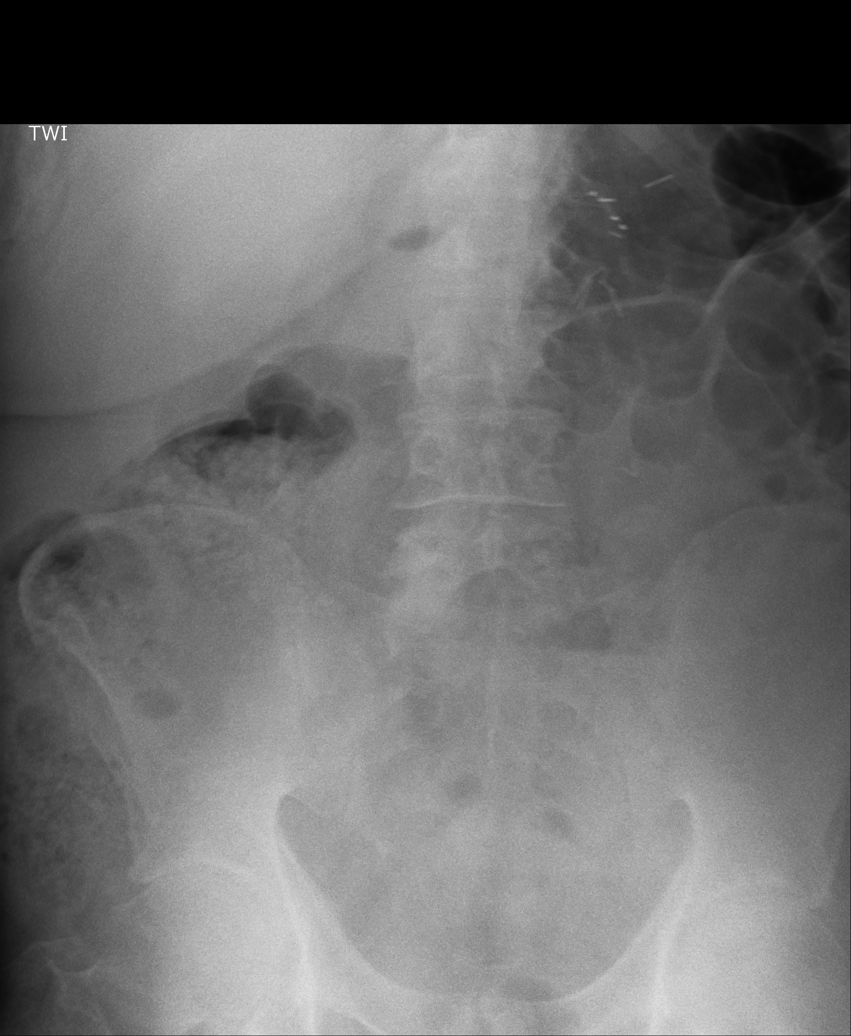
[im 3/6]
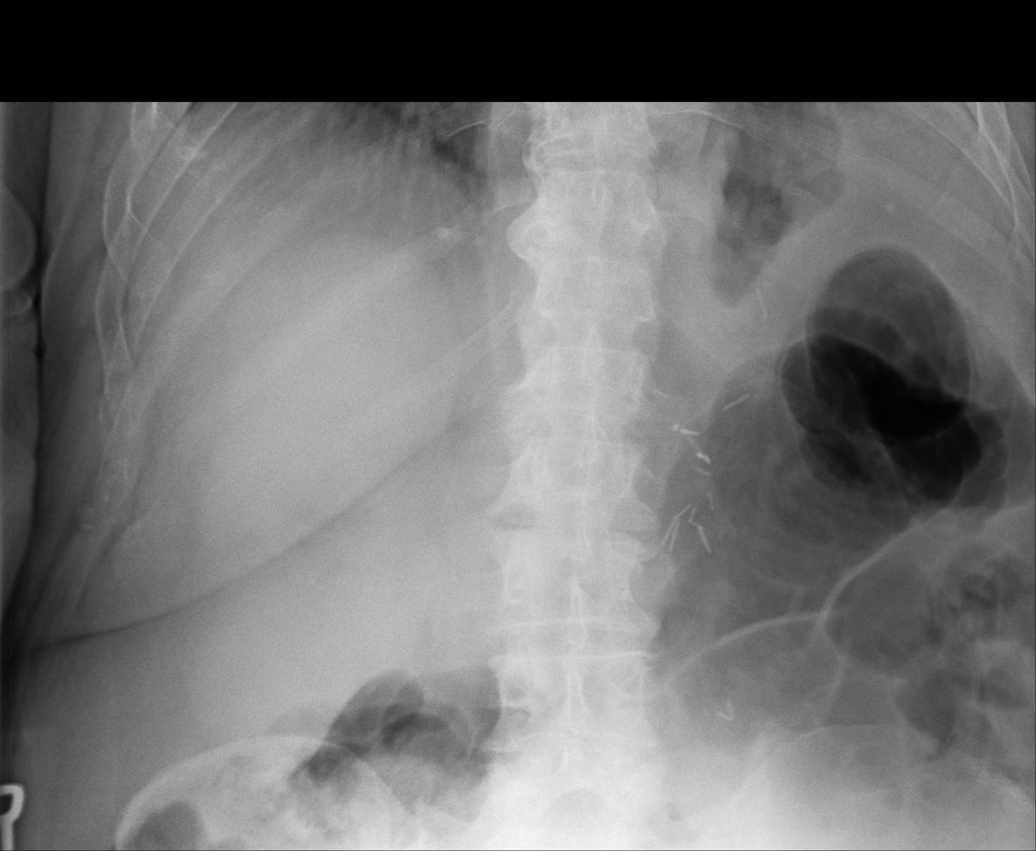
[im 4/6]
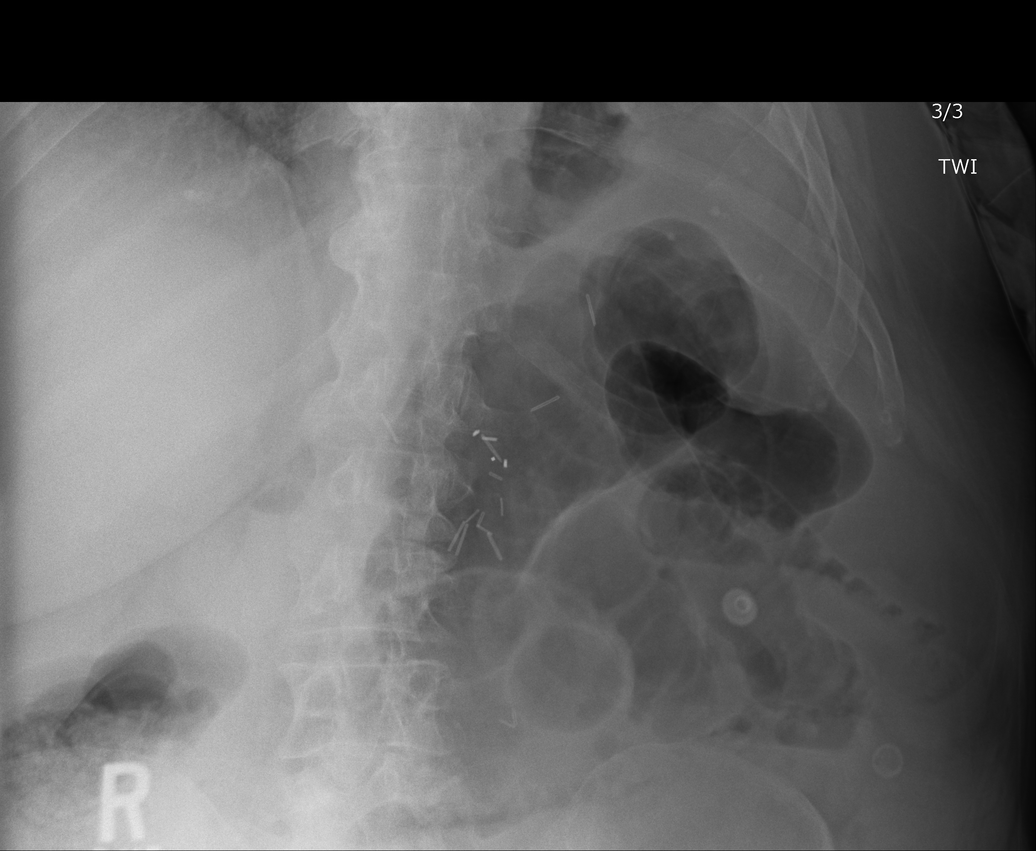
[im 5/6]
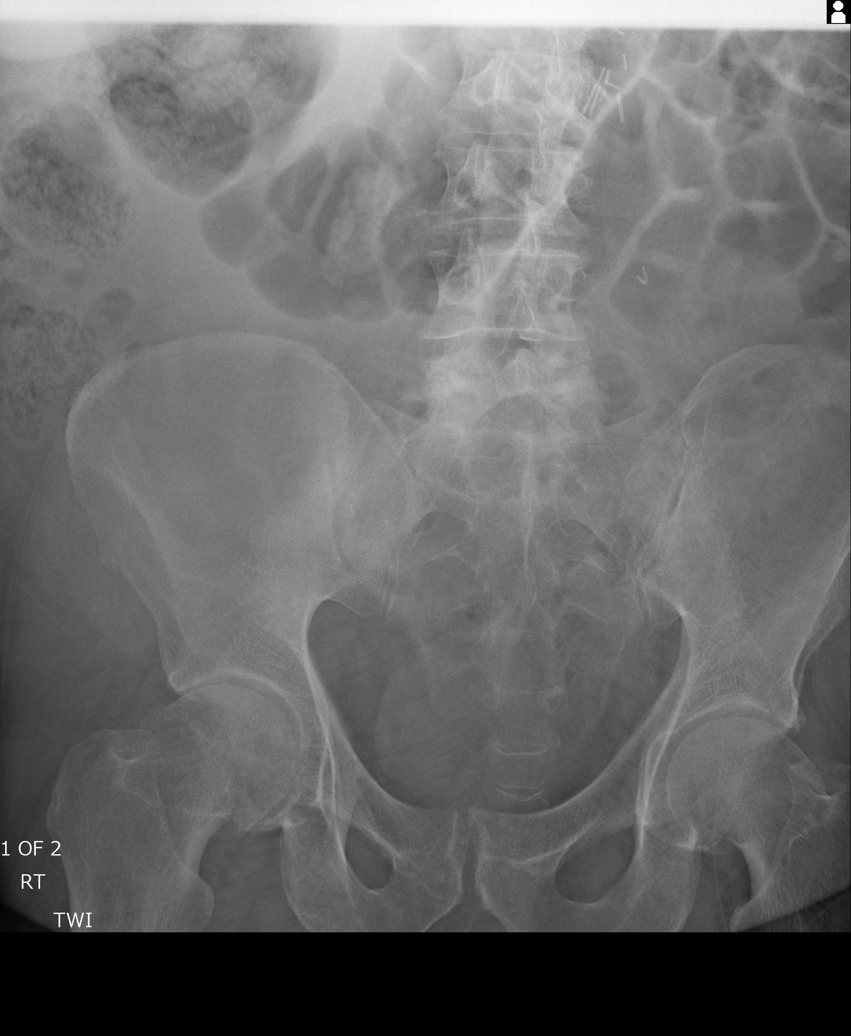
[im 6/6]
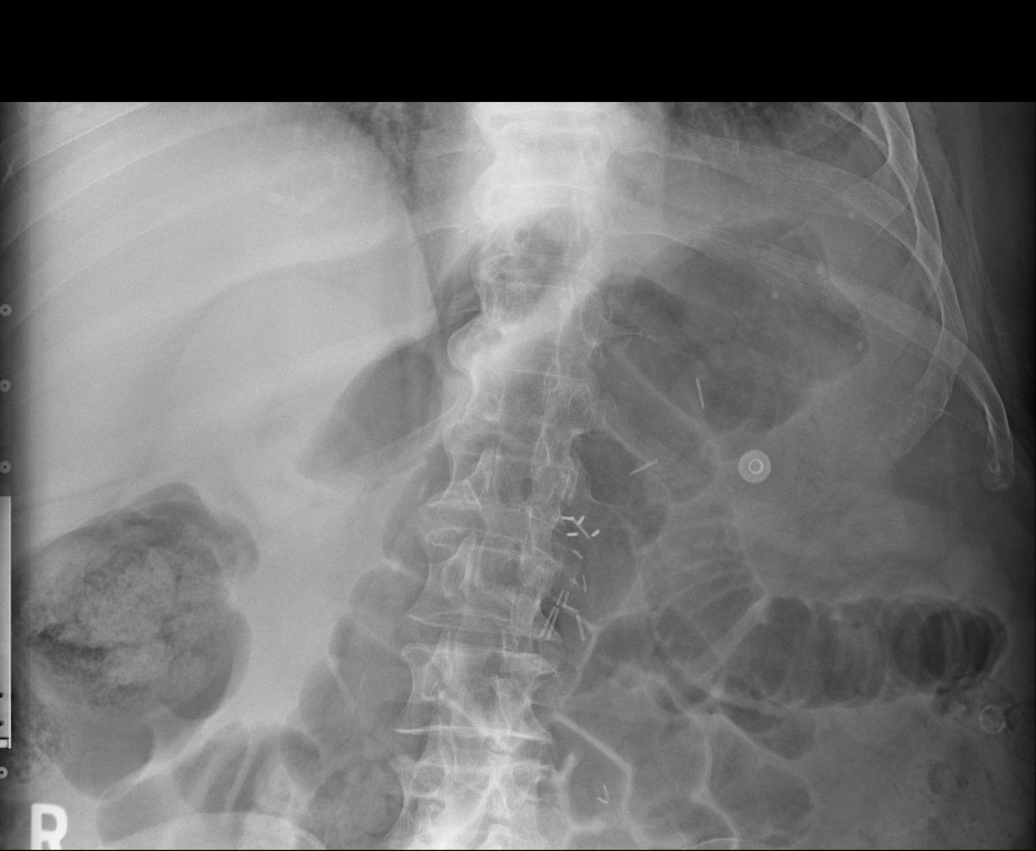

[6 of 6 positions shown; findings below may reference images not displayed]

PROCEDURE:     DXR - DXR ABDOMEN 3-WAY (INCL PA CXR)  - December 06, 2012  [DATE]

RESULT:     The lungs are well-expanded. There is no focal infiltrate. The
cardiac silhouette is top normal in size. The pulmonary vascularity is not
engorged. There is no pleural effusion.

Within the abdomen there is a moderate amount of gas within loops of mildly
distended small bowel in the left mid abdomen. There is gas and stool within
the colon. There is a small amount of gas demonstrated in the rectum. No
free extraluminal gas collections are demonstrated. There are surgical clips
in the midabdomen on the left.
IMPRESSION: 1. The bowel gas pattern suggests an ileus or gastroenteritis type process.
I do not see definite evidence of obstruction. There is no evidence of
perforation.
2. There is no evidence of acute cardiopulmonary abnormality.

[REDACTED]

## 2014-07-19 IMAGING — CT CT ABD-PELV W/ CM
1 of 2 series · 14 of 32 positions shown, 18 images · non-contrast
Comparison: none

REASON FOR EXAM: (1) central abdominal pain, leukocytosis; (2) central
abdominal pain, leukocytos
COMMENTS:   May transport without cardiac monitor

[Series 2: 3mm soft tissue · axial · 0.88mm/px · z∈[+264,+720]mm · 14 of 166 slices shown, 18 images]
[im 7/166  soft-tissue]
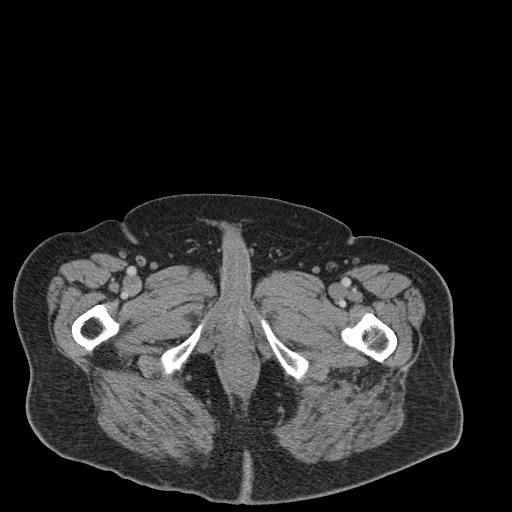
[im 7/166  bone]
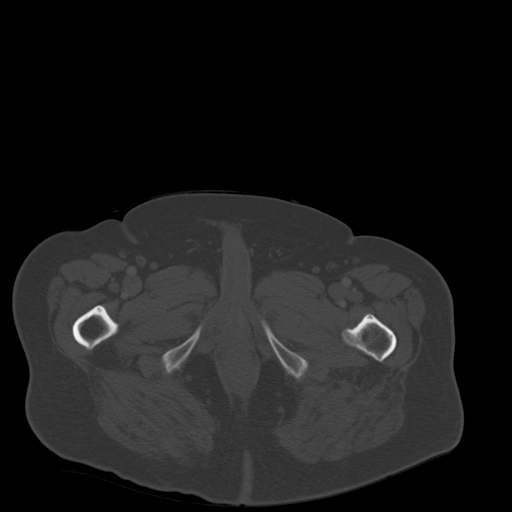
[im 21/166  soft-tissue]
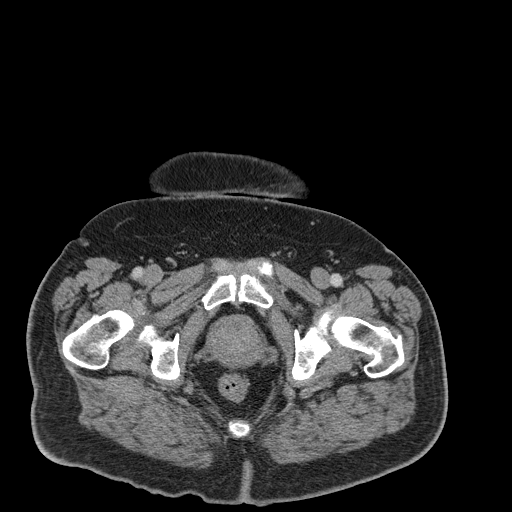
[im 35/166  soft-tissue]
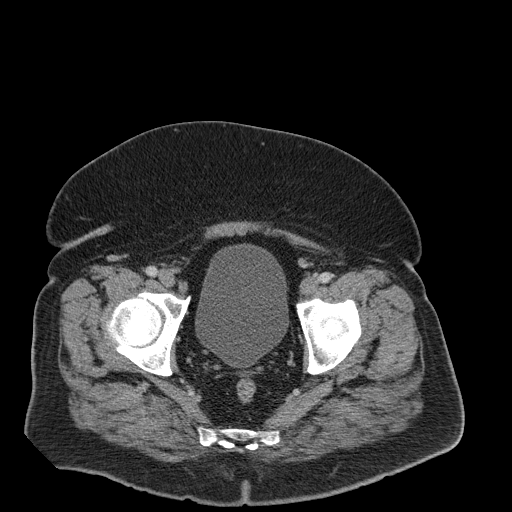
[im 49/166  soft-tissue]
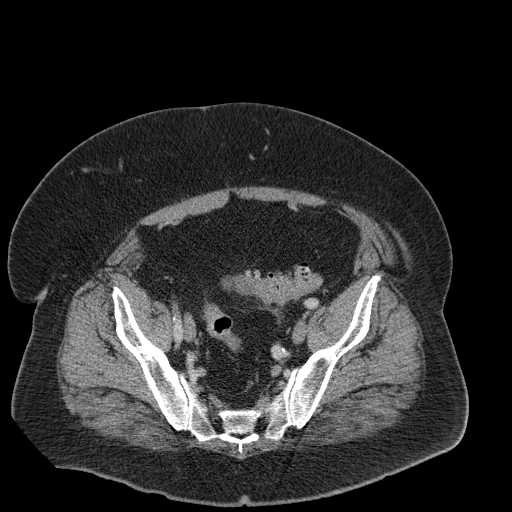
[im 62/166  soft-tissue]
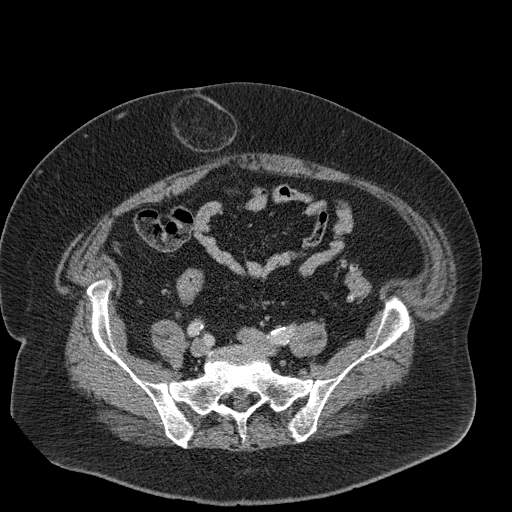
[im 76/166  soft-tissue]
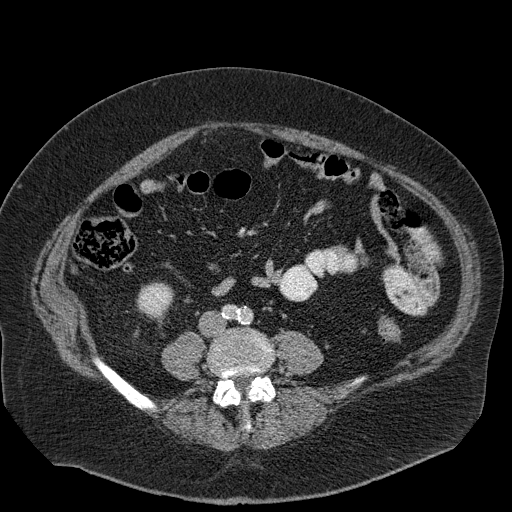
[im 90/166  soft-tissue]
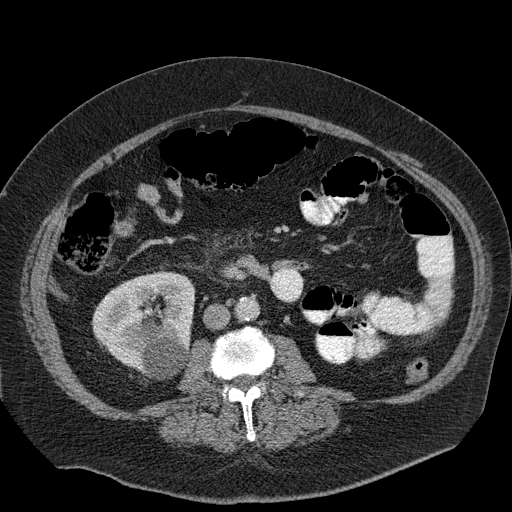
[im 104/166  soft-tissue]
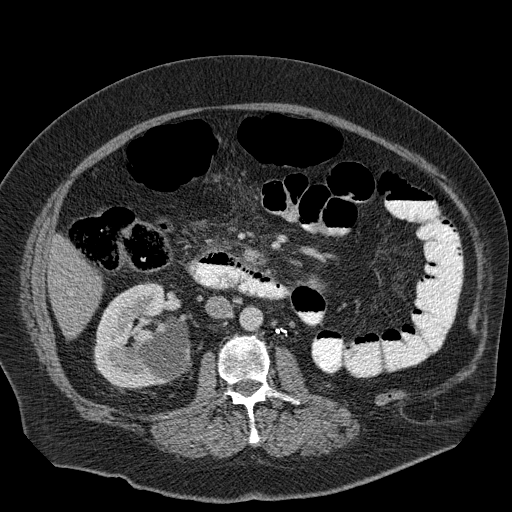
[im 117/166  soft-tissue]
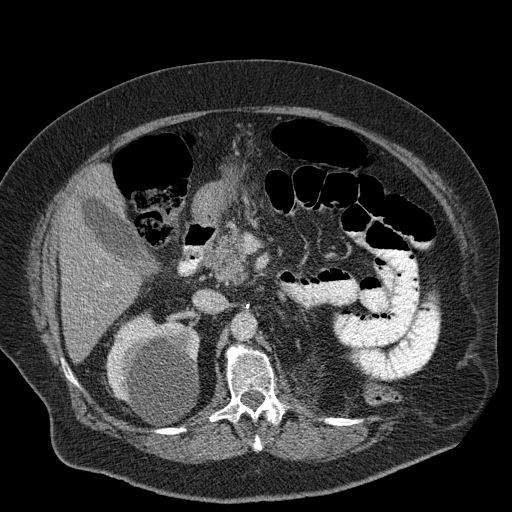
[im 117/166  bone]
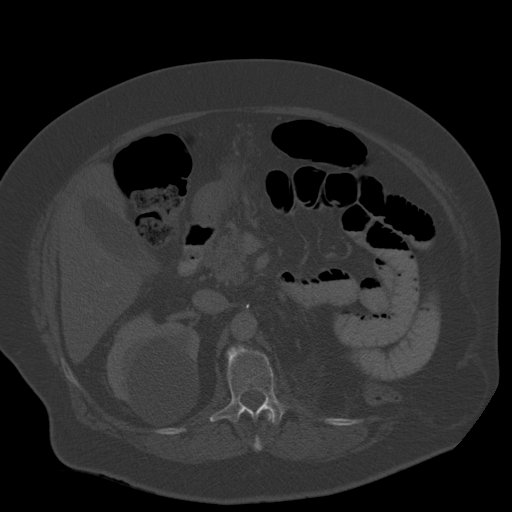
[im 131/166  soft-tissue]
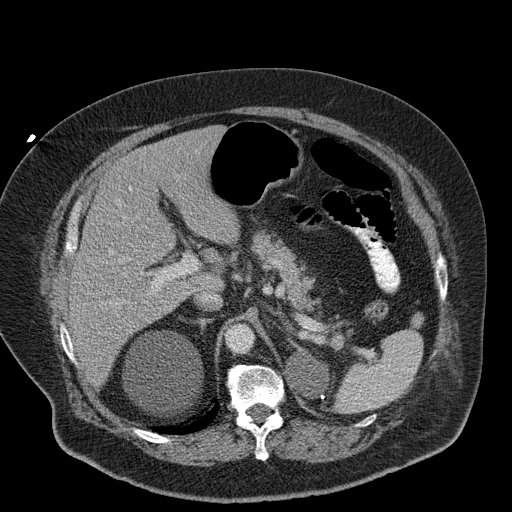
[im 138/166  lung]
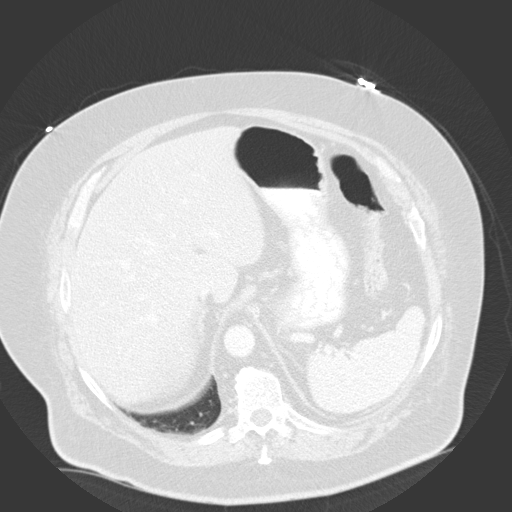
[im 145/166  soft-tissue]
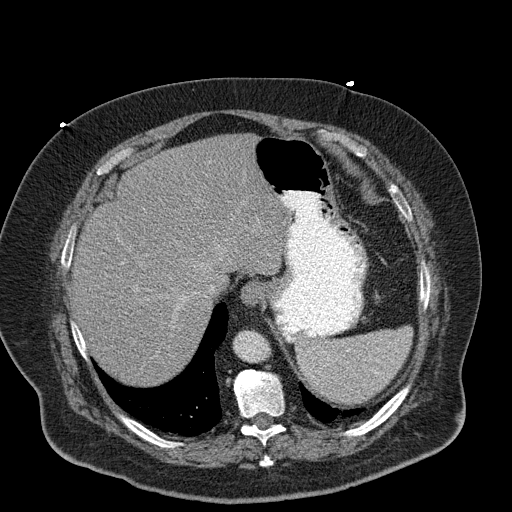
[im 145/166  lung]
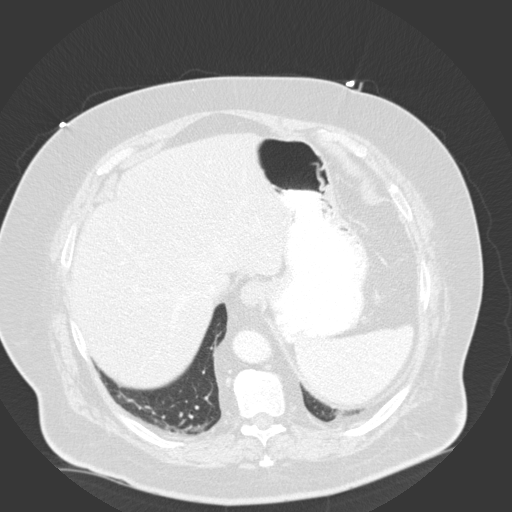
[im 152/166  lung]
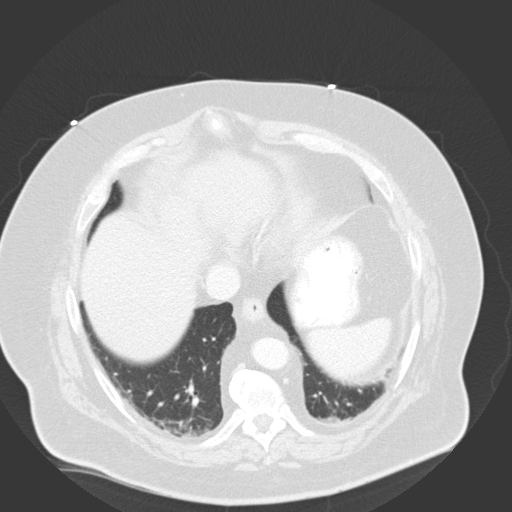
[im 159/166  soft-tissue]
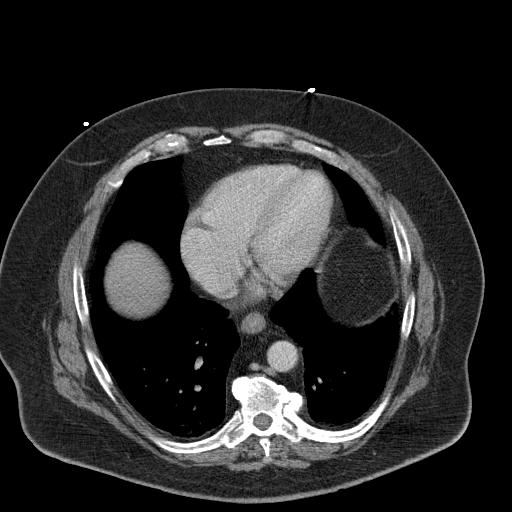
[im 159/166  lung]
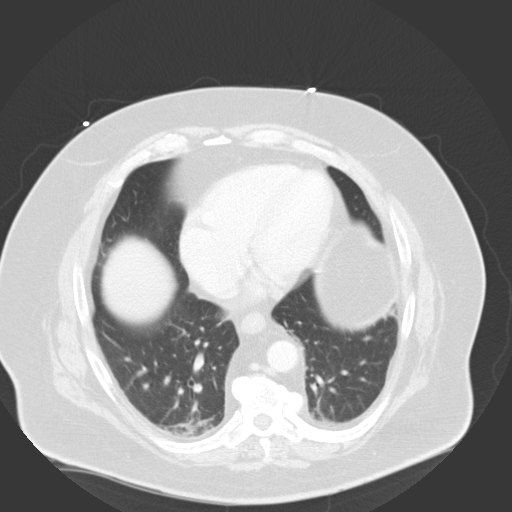

[14 of 32 positions shown; findings below may reference images not displayed]

PROCEDURE:     CT  - CT ABDOMEN / PELVIS  W  - December 06, 2012 [DATE]

RESULT:     CT of the abdomen and pelvis is performed utilizing 100 mL of
[id number] iodinated intravenous contrast and oral contrast with images
reconstructed at 3.0 mm slice thickness in the axial plane. There is no
previous CT of the abdomen and pelvis for comparison.

Images through the base the lungs demonstrate dependent atelectasis. The
patient is status post left nephrectomy. There is a mass in the left adrenal
gland measuring 4.67 cm anterior-posterior x 3.5 cm transversely. This
appears to be unchanged compared to previous CT of the chest dated
08/10/2011. There some peripheral density adjacent to the margin of this
which could be a surgical clip or calcification. There is a large cyst in
the upper pole the right kidney showing a diameter of roughly 8.5 cm.
Granulomatous calcification is seen in the spleen without splenomegaly. The
liver show some evidence of fatty infiltration without a discrete mass.
There is some layering density in the gallbladder which could represent
minimal sludge. No definite stones are identified. The pancreas is
unremarkable. The aorta is normal in caliber. There is a fat filled
umbilical hernia. There is no abnormal bowel distention or bowel wall
thickening. Sigmoid colon diverticulosis is present without definite CT
evidence of acute diverticulitis, perforation or abscess. A normal appearing
appendix is demonstrated. The right kidney shows no definite stones. There
some small cysts in the lower pole of the right kidney. There is a moderate
cyst medially in the midpole of the right kidney showing a diameter of 7 mm.
No adenopathy is evident. The bony structures appear intact. The urinary
bladder is unremarkable. The prostate appears within normal limits.
IMPRESSION: 1. Sigmoid diverticulosis without definite diverticulitis. Some scattered
diverticula are seen in the descending colon.
2. Fat filled umbilical hernia.
3. Stable left adrenal mass.
4. Right renal cysts.
5. Fatty infiltration of the liver. There may be some minimal gallbladder
sludge present.

[REDACTED]

## 2014-07-21 IMAGING — US ABDOMEN ULTRASOUND
1 series · 13 of 25 positions shown · non-contrast
Comparison: none

REASON FOR EXAM: abdominal pain, pancreatitis
COMMENTS:

[Series 1: abdomen ultrasound · 0.40mm/px · 13 of 124 slices shown]
[im 1/124]
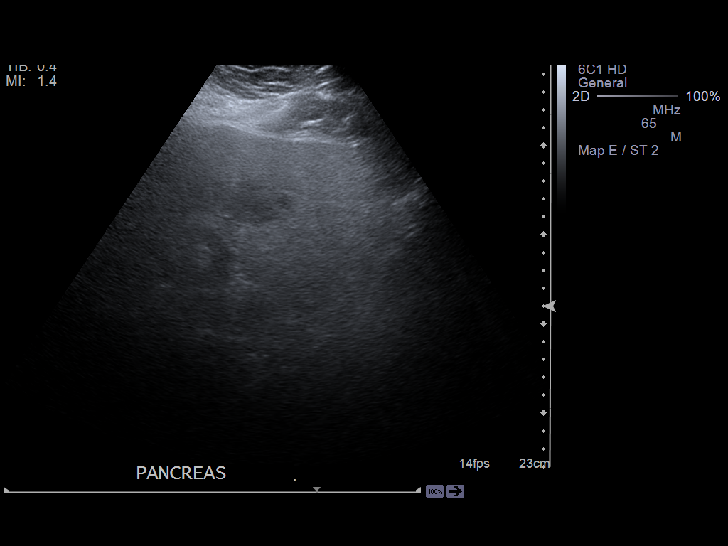
[im 11/124]
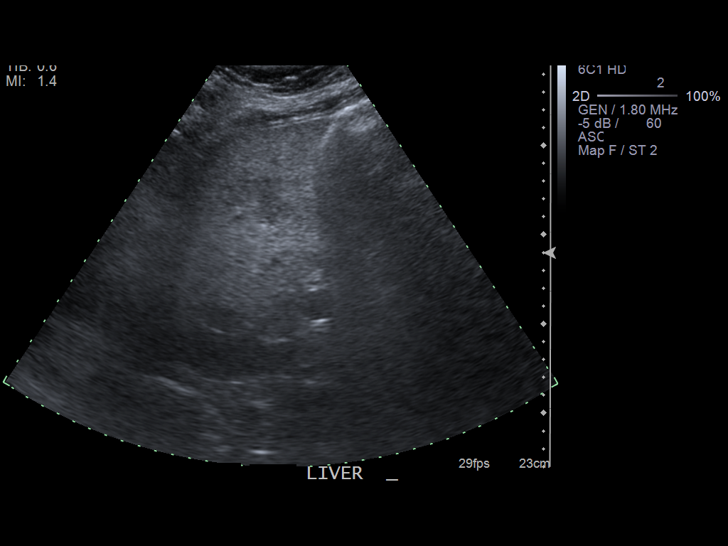
[im 21/124]
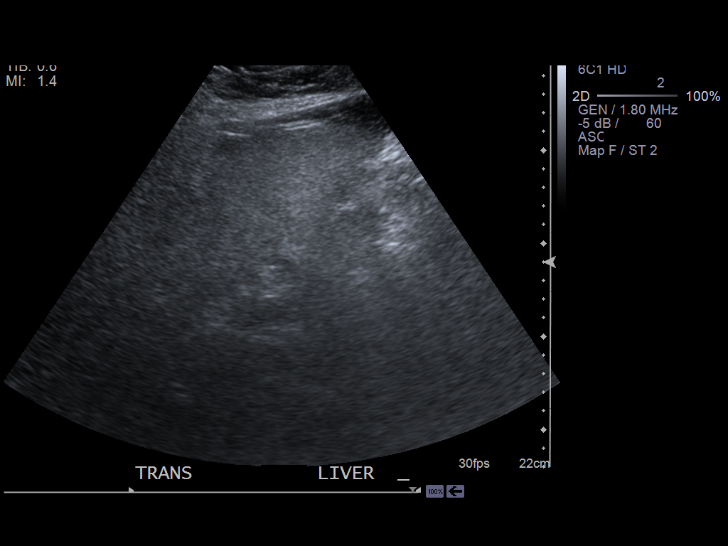
[im 31/124]
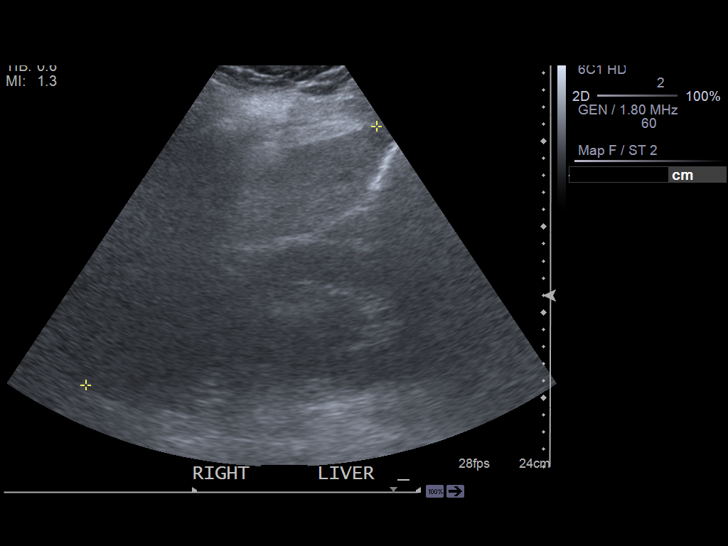
[im 42/124]
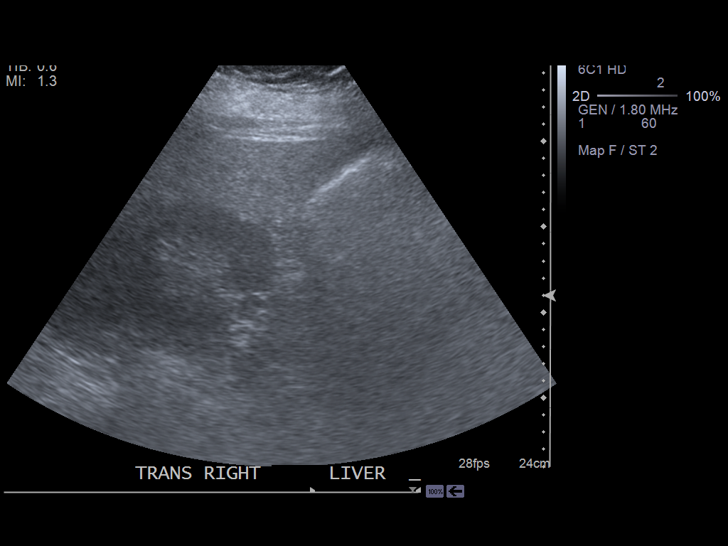
[im 52/124]
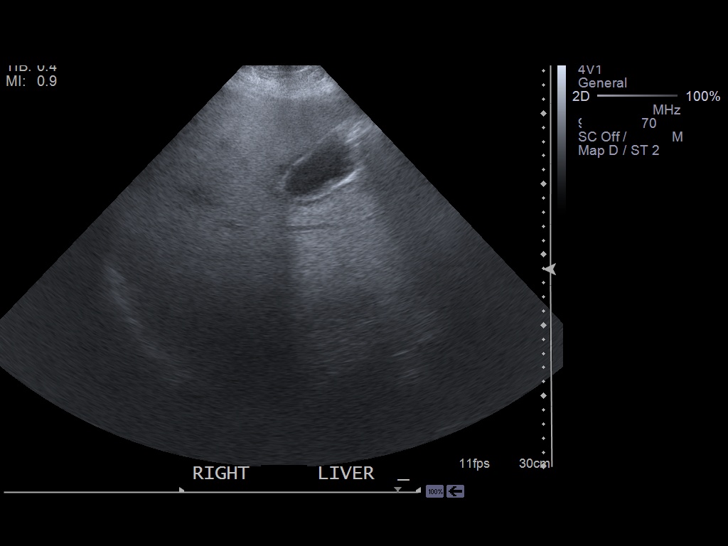
[im 62/124]
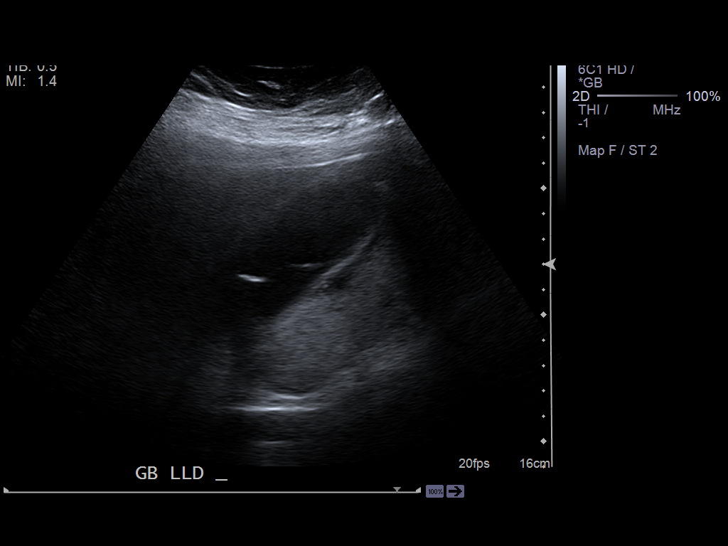
[im 72/124]
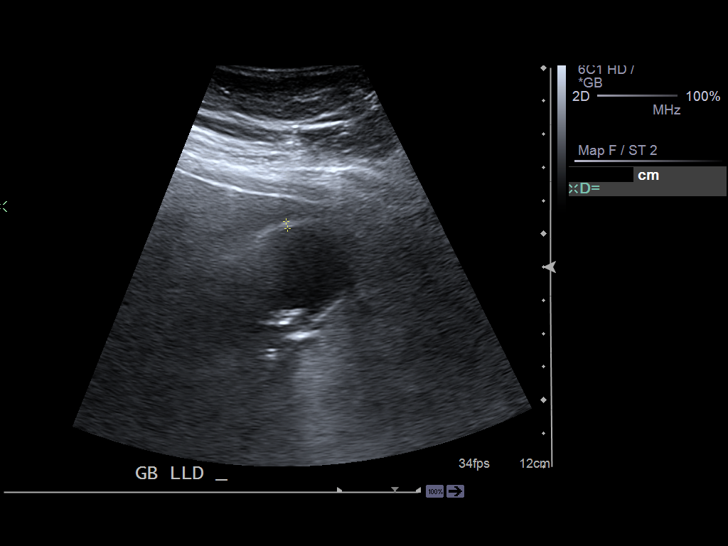
[im 83/124]
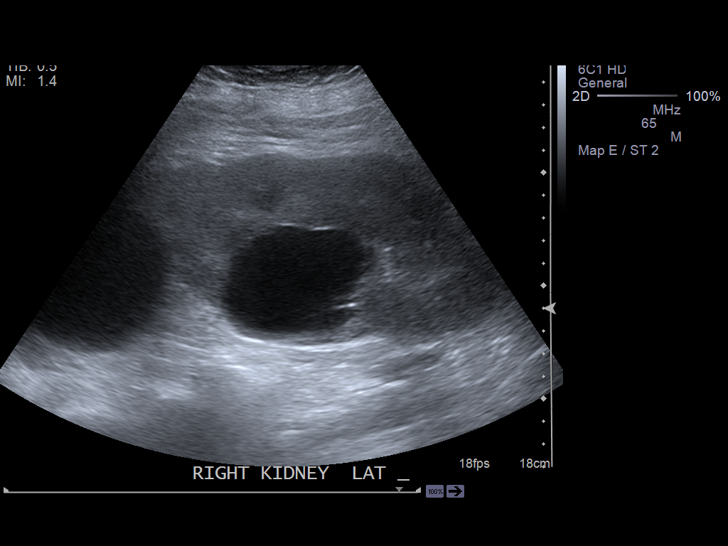
[im 93/124]
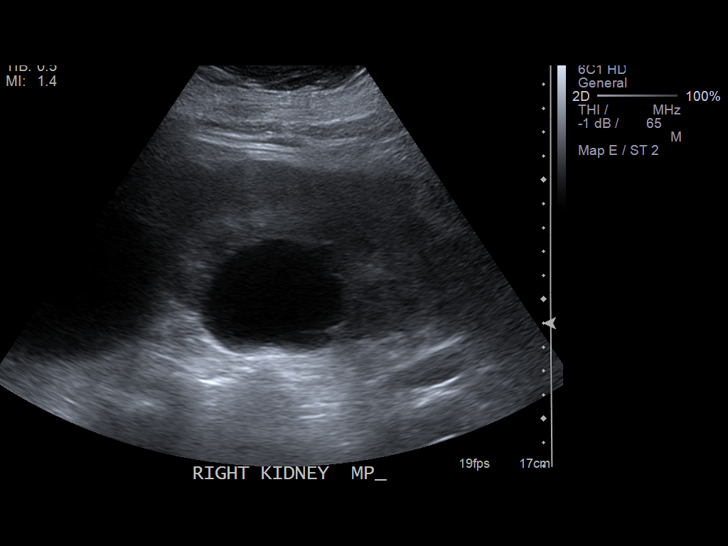
[im 103/124]
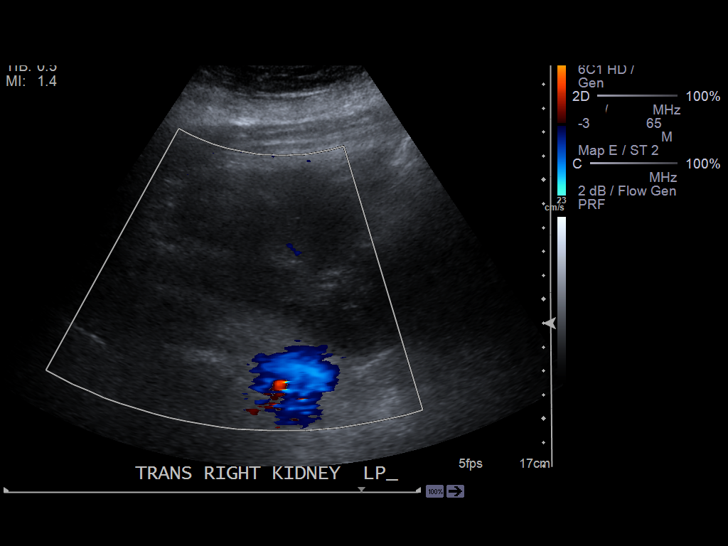
[im 113/124]
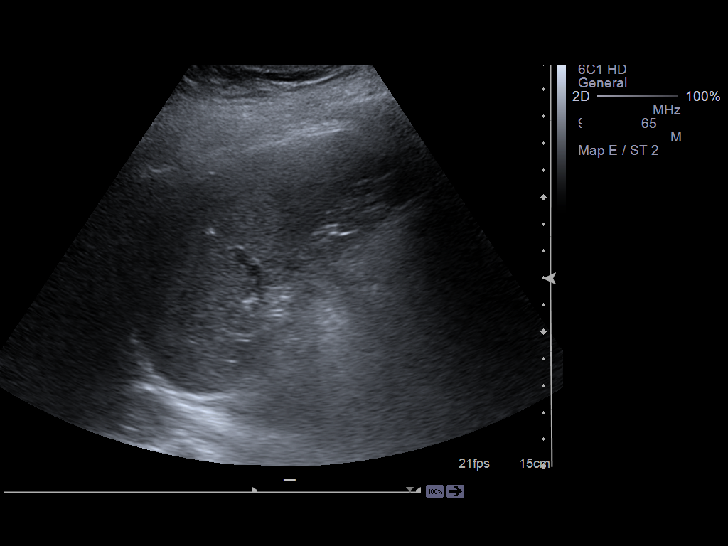
[im 124/124]
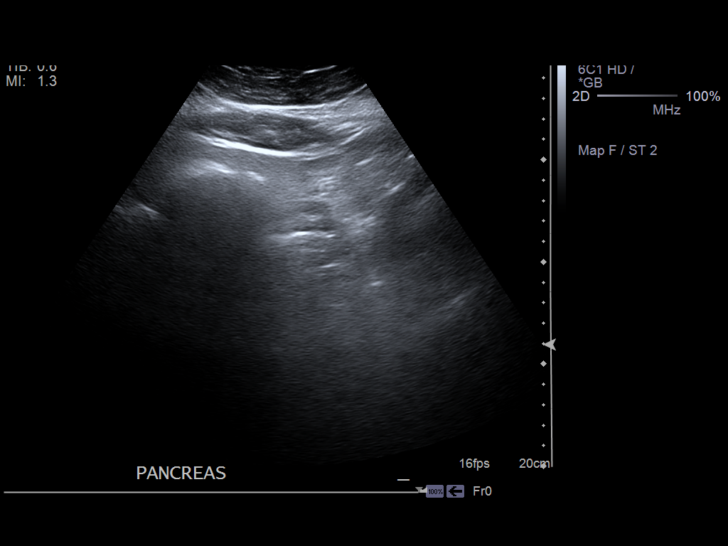

[13 of 25 positions shown; findings below may reference images not displayed]

PROCEDURE:     US  - US ABDOMEN GENERAL SURVEY  - December 08, 2012 [DATE]

RESULT:     The study is limited due to the patient's body habitus and the
presence of bowel gas. The liver is enlarged measuring 22.7 cm in greatest
dimension. In the left hepatic lobe there is a hypoechoic focus measuring
3.9 x 2.6 x 4.5. Portal venous flow is normal in direction toward the liver.
The overall hepatic echotexture is increased.

The gallbladder is adequately distended with echogenic mobile shadowing
stones demonstrated. There is no positive sonographic Murphy's sign. There
is a small amount of pericholecystic fluid but there is no significant
gallbladder wall thickening. The pancreas was obscured by bowel gas. The
spleen is normal in size. The abdominal aorta exhibits maximal diameter of
3.5 cm. The left kidney is surgically absent. The right kidney exhibits
compensatory hypertrophy measuring 15.2 cm in greatest dimension. There is
no evidence of the reduction but there are multiple large cysts in the right
kidney.
IMPRESSION: 1. There are multiple gallstones present. There is a small amount of
pericholecystic fluid but there is no positive sonographic Murphy's sign nor
definite wall thickening.
2. The pancreas could not be demonstrated due to the presence of bowel gas.
3. The liver exhibits a hypoechoic region in the left lobe which was not
demonstrated on the previous CT scan 06 December, 2012. This may be
artifactual.
4. There are multiple cysts associated with the enlarged right kidney. The
left kidney is surgically absent.
5. The abdominal aorta was suspected to measure 3.5 cm in diameter, but the
recent CT scan revealed a normal calibered abdominal aorta. Today's study
was limited by the patient's body habitus and bowel gas.

[REDACTED]

## 2014-08-20 IMAGING — CR DG CHOLANGIOGRAM OPERATIVE
2 series · 2 of 2 positions shown · non-contrast
Comparison: none

REASON FOR EXAM: Cholelithiasis
COMMENTS:

PROCEDURE:     DXR - DXR CHOLANGIOGRAM OP (INITIAL)  - January 07, 2013 [DATE]
RESULT:

[[id] (1 of 2)]
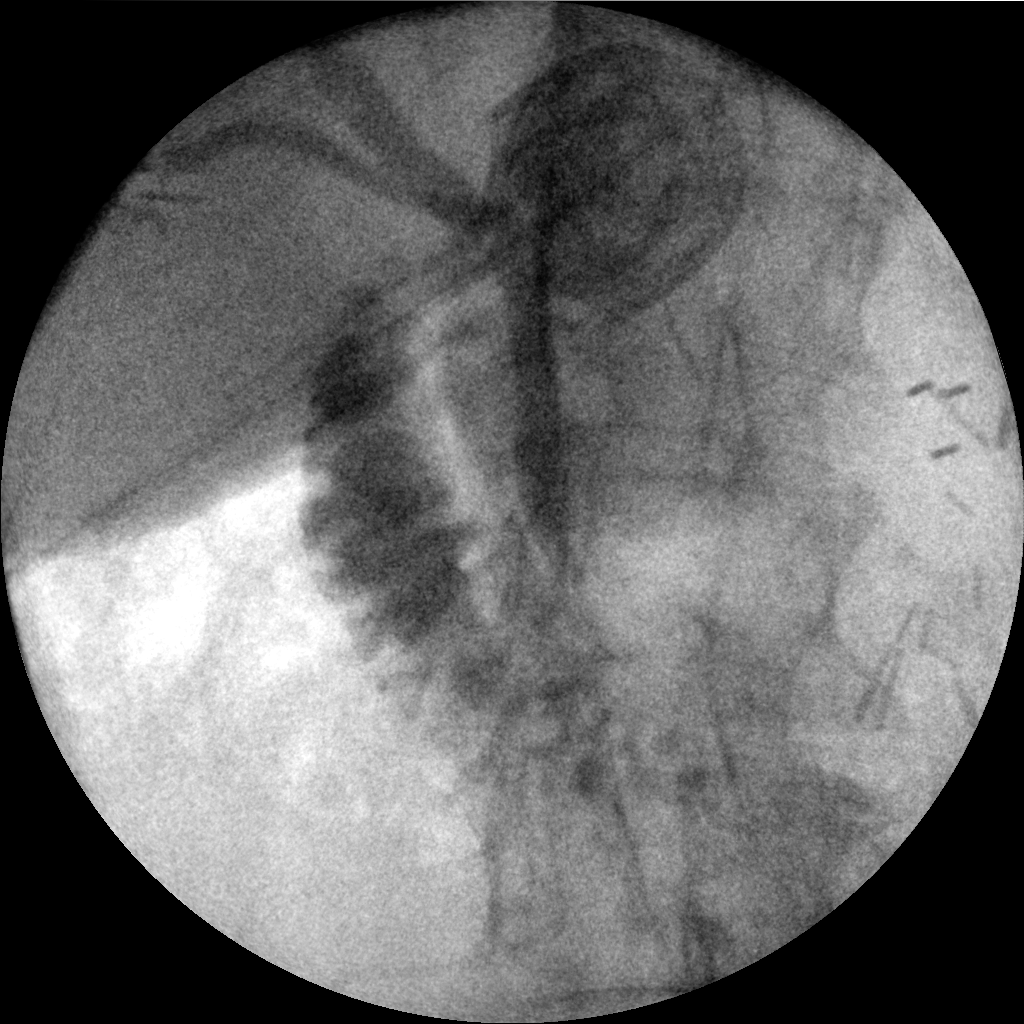

[[id] (2 of 2)]
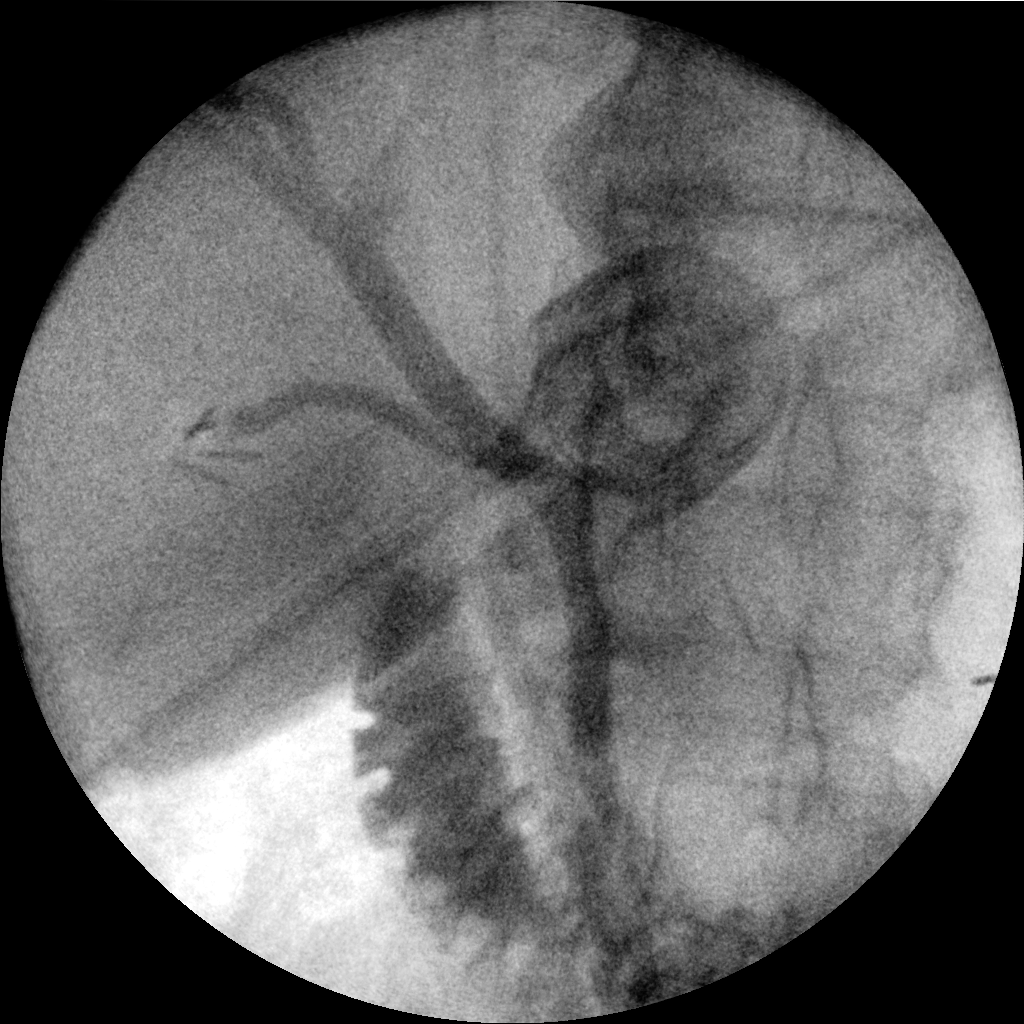

[2 of 2 positions shown; findings below may reference images not displayed]

FINDINGS: Contrast is appreciated filling the common bile duct with contrast
drainage into the duodenum. There is no evidence of contrast extravasation.
IMPRESSION: Intra-operative Cholangiogram as described above.

The remainder of the interpretation will be left to the performing physician.

## 2014-09-15 DIAGNOSIS — E1165 Type 2 diabetes mellitus with hyperglycemia: Secondary | ICD-10-CM | POA: Diagnosis not present

## 2014-09-15 DIAGNOSIS — E211 Secondary hyperparathyroidism, not elsewhere classified: Secondary | ICD-10-CM | POA: Diagnosis not present

## 2014-09-15 DIAGNOSIS — F17210 Nicotine dependence, cigarettes, uncomplicated: Secondary | ICD-10-CM | POA: Diagnosis not present

## 2014-09-15 DIAGNOSIS — G4733 Obstructive sleep apnea (adult) (pediatric): Secondary | ICD-10-CM | POA: Diagnosis not present

## 2014-09-15 DIAGNOSIS — M25551 Pain in right hip: Secondary | ICD-10-CM | POA: Diagnosis not present

## 2014-09-15 DIAGNOSIS — D441 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: Secondary | ICD-10-CM | POA: Diagnosis not present

## 2014-09-15 DIAGNOSIS — I10 Essential (primary) hypertension: Secondary | ICD-10-CM | POA: Diagnosis not present

## 2014-10-03 NOTE — Discharge Summary (Signed)
PATIENT NAME:  Larry Callahan, Larry Callahan MR#:  [id number] DATE OF BIRTH:  1955-12-13  DATE OF ADMISSION:  12/06/2012 DATE OF DISCHARGE:  12/11/2012  PRIMARY CARE PHYSICIAN:  Lavera Guise, MD  SURGEON:  Loreli Dollar, MD  FINAL DIAGNOSES:   1.  Gallstone pancreatitis.  2.  Sleep apnea and obesity.  3.  Hypertension.  4.  Diabetes.  5.  Hyperlipidemia and hypertriglyceridemia.  6.  Hypomagnesemia and hypokalemia.  7.  Tobacco abuse.   MEDICATIONS ON DISCHARGE:  Include metoprolol 50 mg extended-release 1 tablet daily, amlodipine/benazepril 5/20 mg 1 capsule twice a day, gemfibrozil 600 mg twice a day, glyburide/metformin 5/500 mg 1 tablet twice a day, lovastatin 20 mg daily at bedtime, omeprazole 40 mg daily, aspirin 325 mg daily (need to hold aspirin 1 week prior to surgery), acetaminophen/oxycodone 325/5 mg 1 tablet every 6 hours as needed for pain, loratadine 10 mg p.o. daily.   DIET:  Low sodium diet, low fat diet, low cholesterol, carbohydrate-controlled diet, regular consistency.   ACTIVITY:  As tolerated.   FOLLOWUP:  With Dr. Loreli Dollar of surgery in 2 weeks and Dr. Clayborn Bigness in 1 to 2 weeks.   HOSPITAL COURSE:  The patient was admitted on December 06, 2012 and discharged on December 11, 2012. The patient came in with abdominal pain, nausea, vomiting and an episode of diarrhea. He was admitted with acute pancreatitis. He was given IV fluid hydration, n.p.o., GI consultations. His blood pressure was elevated likely secondary to pain.   LABORATORY AND RADIOLOGICAL DATA DURING THE HOSPITAL COURSE:  Included a hemoglobin A1c of 6.9. Ethanol level less than 3. Glucose 195, BUN 13, creatinine 0.99, sodium 136, potassium 3.7, chloride 104, CO2 was 22, calcium 9.4, alkaline phosphatase 145, ALT 132, AST 52, albumin 3.4, lipase 1109. White blood cell count 16.4, hemoglobin and hematocrit 15.3 and 45.3, platelet count 195. Chest and abdominal x-ray unremarkable. Urinalysis showed greater than 500 mg/dL  of protein and greater than 500 mg/dL of glucose. CT scan of the abdomen and pelvis with contrast showed sigmoid diverticulosis without evidence of diverticulitis, fat-filled umbilical hernia, stable left adrenal mass, right renal cyst, fatty infiltration of the liver, minimal gallbladder sludge present. Lipase quickly responded to IV fluids and on December 07, 2012 was in the normal range. TSH was 1.11. LDL was 72, HDL 45, triglycerides 98. Ultrasound of the abdomen showed multiple gallstones, small amount of pericholecystic fluid, but no positive sonographic Murphy's sign or wall thickening. The patient's potassium upon discharge is 3.8, magnesium 1.8, glucose 133.   HOSPITAL COURSE PER PROBLEM LIST:   1.  For the patient's gallstone pancreatitis, the lipase quickly improved the next day with IV fluid hydration. The patient was kept n.p.o. with very slow advancement of the diet throughout the entire hospital course because the patient still had pain. The patient was seen in consultation by Dr. Candace Cruise of gastroenterology and Dr. Rochel Brome of surgery. The patient will follow up with Dr. Rochel Brome as an outpatient for elective cholecystectomy.  2.  For his sleep apnea and obesity, weight loss is recommended. The patient does wear his CPAP at night.  3.  Hypertension. The patient was kept on his usual medications. Blood pressure was borderline during the hospital course likely secondary to pain and getting IV fluids. I kept him on his usual medications.  4.  Diabetes. He was kept on sliding scale. His hemoglobin A1c is actually very good. He will be able to go back  on his usual glipizide/metformin as outpatient.  5.  For his hyperlipidemia and hypertriglyceridemia, he is on gemfibrozil and lovastatin. Cholesterol profile is good.  6.  For his hypomagnesemia and hypokalemia, this was secondary to being n.p.o. and on a liquid diet for a period of this. These were replaced during the hospital stay IV and orally. No  replacement as outpatient secondary to the patient being on a diet.  7.  Tobacco abuse. The patient did want to go cold [place] on this. I advised him the longer away from tobacco he is the better off he will be going into a surgical procedure. The patient understands that he should not go back to smoking. I am unclear if he will or not at this point.   TIME SPENT ON DISCHARGE:  35 minutes.    ____________________________ Tana Conch. Leslye Peer, MD rjw:si D: 12/11/2012 16:58:37 ET T: 12/11/2012 20:39:08 ET JOB#: [id number]  cc: Tana Conch. Leslye Peer, MD, <Dictator> Lavera Guise, MD J. Rochel Brome, MD  Marisue Brooklyn MD ELECTRONICALLY SIGNED 12/22/2012 11:06

## 2014-10-03 NOTE — Consult Note (Signed)
Chief Complaint:  Subjective/Chief Complaint Felt sick with nausea/pain after taking clear liquid diet last night. Dr. Tamala Julian saw patient. U/S pending for today.   VITAL SIGNS/ANCILLARY NOTES: **Vital Signs.:   28-Jun-14 06:43  Vital Signs Type POCT  Nurse Fingerstick (mg/dL) FSBS (fasting range 65-99 mg/dL) 109  Comments/Interventions  Nurse Notified   Brief Assessment:  GEN no acute distress   Cardiac Regular   Respiratory clear BS   Gastrointestinal RUQ tenderness   Lab Results: Routine Chem:  28-Jun-14 05:39   Lipase 140 (Result(s) reported on 08 Dec 2012 at 06:17AM.)  Routine Hem:  28-Jun-14 05:39   WBC (CBC)  13.9  RBC (CBC) 4.49  Hemoglobin (CBC) 13.5  Hematocrit (CBC) 40.0  Platelet Count (CBC) 168  MCV 89  MCH 30.1  MCHC 33.8  RDW 13.0  Neutrophil % 78.4  Lymphocyte % 12.8  Monocyte % 7.1  Eosinophil % 1.1  Basophil % 0.6  Neutrophil #  10.9  Lymphocyte # 1.8  Monocyte # 1.0  Eosinophil # 0.2  Basophil # 0.1 (Result(s) reported on 08 Dec 2012 at 06:17AM.)   Assessment/Plan:  Assessment/Plan:  Assessment Pancreatis. Appear to have resolved but patient remains symptomatic.   Plan Will see U/S results. IF GB abnormal, will proceed with surgery. Will check back in AM. thanks   Electronic Signatures: Verdie Shire (MD)  (Signed 28-Jun-14 09:43)  Authored: Chief Complaint, VITAL SIGNS/ANCILLARY NOTES, Brief Assessment, Lab Results, Assessment/Plan   Last Updated: 28-Jun-14 09:43 by Verdie Shire (MD)

## 2014-10-03 NOTE — Consult Note (Signed)
PATIENT NAME:  Larry Callahan, Larry Callahan MR#:  [id number] DATE OF BIRTH:  March 04, 1956  DATE OF CONSULTATION:  12/07/2012  REFERRING PHYSICIAN:  [name] CONSULTING PHYSICIAN: Verdie Shire, MD / Corky Sox. Oreste Majeed, PA-C  REASON FOR CONSULTATION: Pancreatitis.   HISTORY OF PRESENT ILLNESS: This is a pleasant 59 year old gentleman who initially presented to the Emergency Department with epigastric abdominal pain, nausea and some looser stools. Upon further work-up, he was found to have an elevated lipase level of 1,109 along with some transaminitis, AST 52 and ALT of 132. Bilirubin was within normal limits, but alk phos was 145. Therefore, he was admitted with acute pancreatitis. A CT scan of the abdomen and pelvis was obtained and the pancreas did appear unremarkable, but there was some fatty liver changes as well as some gallbladder sludge. He has been n.p.o. since admission, was started on IV fluids and symptomatic control. Lipid panel was within normal limits, triglycerides were 98, and he denies any recent alcohol use. He states that this has never happened before. He does admit that his abdominal pain has improved since admission, but it is still present. He is complaining of an appetite as he is hungry and thirsty. No other symptomatic concerns or complaints at this time. No fever or chills. No chest pain or shortness of breath. He does have a history of sleep apnea and is currently on supplemental oxygen through CPAP.   ALLERGIES: PENICILLIN.   HOME MEDICATIONS: Amlodipine, benazepril, gemfibrozil, aspirin, glyburide, metformin, lovastatin, metoprolol, omeprazole.   PAST MEDICAL HISTORY: Diabetes mellitus, morbid obesity, obstructive sleep apnea, GERD, hypertension and dyslipidemia.   FAMILY HISTORY: No known family history of GI malignancy, colon polyps or IBD.   SOCIAL HISTORY: The patient does report daily tobacco use, but smokes less than a pack per day. He denies any alcohol use. He denies illicit drug use.  Per the patient's sister, he has been incredibly irritable over the past several months because he did recently lose his brother who had passed away just a couple of months ago.   REVIEW OF SYSTEMS:  A 10 system review of systems was obtained on the patient. Pertinent positives are mentioned above and otherwise negative.   PHYSICAL EXAMINATION:  VITAL SIGNS: Blood pressure 164/83, heart rate 71, respirations 19, temperature 98 degrees, bedside pulse ox is 95%.  GENERAL: This is a 59 year old gentleman resting quietly and comfortably in the exam room, in no acute distress, alert and oriented x 3.  HEAD: Atraumatic, normocephalic.  NECK: Supple. No lymphadenopathy noted.  HEENT: Sclerae anicteric. Mucous membranes moist.  LUNGS: Respirations are even and unlabored. He is currently hooked up to a breathing machine for assistance.  Clear to auscultation in bilateral anterior lung fields.  HEART:  Regular rate and rhythm. S1, S2 noted.  ABDOMEN: Soft, mildly tender in the epigastric region, nondistended. Exam is significantly limited secondary to his morbidly obese habitus.  EXTREMITIES: 2+ pulses noted in bilateral upper extremities.  PSYCHIATRIC: The patient is irritable.  RECTAL: Deferred.   LABORATORY AND DIAGNOSTICS: White blood cells 16.2, hemoglobin 13.9, hematocrit 41.2, platelets 163.  Sodium 138, potassium 3.6, BUN 12, creatinine 0.73, glucose 111.  Lipase is 278, down from 1,109. Lipid panel was within normal limits. Triglycerides 98.  Serum ethanol level was negative. Albumin 2.8, bilirubin 0.5, alk phos 103 down from 145, AST 23 down from 52 and ALT 82 down from 132.   Imaging: CT of the abdomen and pelvis was obtained on the patient and notable for minimal  gallbladder sludge as well as fatty infiltration of the liver. Pancreas appeared unremarkable. There was diverticulosis without signs of diverticulitis.   A three way abdominal x-ray was obtained showing possible gastroenteritis  versus ileus based on the bowel gas pattern. No evidence of an acute cardiopulmonary abnormality.   ASSESSMENT: 1.  Acute pancreatitis.  2.  Transaminitis.  3.  Abdominal pain, likely secondary to above.  4.  Morbid obesity and history of obstructive sleep apnea. The patient uses a CPAP.  5.  Fatty liver noted on CT scan.   PLAN: I have discussed this patient's case in detail with Dr. Verdie Shire who is involved in the development of the patient's plan of care. At this time, we do agree with the patient being kept n.p.o. status and started on IV fluids as well as symptomatic management with pain control and antiemetics. He denies any recent alcohol use and his triglycerides are within normal limits. Because he did have some mild transaminitis that has improved from yesterday with some gallbladder sludge noted on imaging, this may likely be a biliary-induced pancreatitis. We do recommend checking serial LFTs. We will also obtain a surgical consult for their input as well. We do recommend keeping the patient n.p.o. until surgery has seen the patient. We will continue to monitor this patient throughout hospitalization and make further recommendations pending above and per clinical course. This was discussed with the patient and all questions were answered.   Thank you so much for this consultation and for allowing [name] to participate in the patient's plan of care.   This services provided by Loren Racer, PA-C under collaborative agreement with Dr. Verdie Shire.   ____________________________ Corky Sox. Pranavi Aure, PA-C kme:sb D: 12/07/2012 13:03:45 ET T: 12/07/2012 13:59:04 ET JOB#: [id number]  cc: Corky Sox. Dyneisha Murchison, PA-C, <Dictator> [place] PA ELECTRONICALLY SIGNED 12/12/2012 16:00

## 2014-10-03 NOTE — Consult Note (Signed)
Pt seen and examined. Please see Tobe Sos notes. Pt with obesity, COPD on CPAP, who presents with acute pancreatitis after eating at [employer]. Abd pain better. Hungry and wants to eat. Lipase back to normal though upper abd still tender. Poss GB sludge seen on CT. Pt smoker but does not drink. Gen surgery consulted. If GB surgery warranted, patient wants it out during this hospitalization. To satisfy patient, will start clears for now. Will follow. Thanks.  Electronic Signatures: Verdie Shire (MD)  (Signed on 27-Jun-14 12:54)  Authored  Last Updated: 27-Jun-14 12:54 by Verdie Shire (MD)

## 2014-10-03 NOTE — Consult Note (Signed)
Chief Complaint:  Subjective/Chief Complaint Was able to tolerate low fat diet this AM. No abd pain now.   VITAL SIGNS/ANCILLARY NOTES: **Vital Signs.:   30-Jun-14 06:33  Vital Signs Type POCT  Nurse Fingerstick (mg/dL) FSBS (fasting range 65-99 mg/dL) 147  Comments/Interventions  Nurse Notified   Brief Assessment:  GEN no acute distress   Cardiac Regular   Respiratory clear BS   Gastrointestinal Normal   Lab Results: Routine Chem:  30-Jun-14 05:23   Potassium, Serum  3.3 (Result(s) reported on 10 Dec 2012 at 06:11AM.)  Magnesium, Serum  1.7 (1.8-2.4 THERAPEUTIC RANGE: 4-7 mg/dL TOXIC: > 10 mg/dL  -----------------------)   Assessment/Plan:  Assessment/Plan:  Assessment Pancreatitis. Resolved. Abd pain better now. GB surgery in 2 wks? according to patient. Possible discharge in AM.   Plan Will sign off. Call back if condition changes. Thanks.   Electronic Signatures: Verdie Shire (MD)  (Signed 30-Jun-14 13:15)  Authored: Chief Complaint, VITAL SIGNS/ANCILLARY NOTES, Brief Assessment, Lab Results, Assessment/Plan   Last Updated: 30-Jun-14 13:15 by Verdie Shire (MD)

## 2014-10-03 NOTE — Consult Note (Signed)
Brief Consult Note: Diagnosis: Pancreatitis.   Consult note dictated.   Recommend further assessment or treatment.   Orders entered.   Discussed with Attending MD.   Comments: Patient seen and evaluated. Acute pancreatitis, without radiographic evidence. Lipase and transaminases trending down today compared to yesterday. Pain still present but improving. Denies alcohol, TGs are WNL. GB sludge was noted on CT scan..likely pancreatitis was secondary to this, though he is obese and has hx of fatty liver. Recc a surgical consult for their input. Remain NPO until seen by surgery. Symptomatic control and IVF. Will follow. Full consult being dictated.  Electronic Signatures: Sherald Barge (PA-C)  (Signed 27-Jun-14 12:42)  Authored: Brief Consult Note   Last Updated: 27-Jun-14 12:42 by Sherald Barge (PA-C)

## 2014-10-03 NOTE — Op Note (Signed)
PATIENT NAME:  Larry Callahan, Larry Callahan MR#:  [id number] DATE OF BIRTH:  [birth year]  DATE OF PROCEDURE:  01/07/2013  PREOPERATIVE DIAGNOSES: Chronic cholecystitis, cholelithiasis with a history of biliary pancreatitis.   POSTOPERATIVE DIAGNOSES: Chronic cholecystitis, cholelithiasis with a history of biliary pancreatitis.     PROCEDURE: Laparoscopic cholecystectomy and cholangiogram.   SURGEON: Rochel Brome, M.D.   ANESTHESIA: General.   INDICATION: This 59 year old male was recently in the hospital with acute onset of epigastric pain and elevated lipase. He had ultrasound findings of gallstones. Surgery was recommended for definitive treatment.   DESCRIPTION OF PROCEDURE: The patient was placed on the operating table in the supine position under general endotracheal anesthesia. It is noted the patient was morbidly obese. He did have a small fat-containing umbilical hernia.   An incision was made in the epigastrium just about 2 inches above the umbilicus, oriented transversely and carried down through subcutaneous tissues to encounter the deep fascia, which was grasped with Kocher clamp and subsequently with a bone hook and elevated. A Veress needle was inserted, aspirated, and irrigated with a saline solution. Next, the peritoneal cavity was inflated with carbon dioxide. The Veress needle was removed. The 10 mm cannula was inserted. The 10 mm, 0 degree laparoscope was inserted to view the peritoneal cavity. The liver appeared to have blunted edge and appearance of fatty infiltration. Another incision was made in the epigastrium slightly to the right of the midline to introduce an 11 mm cannula. Two incisions were made in the lateral aspect of the right upper quadrant to introduce two 5 mm cannulas.   The gallbladder was retracted towards the right shoulder. The infundibulum was retracted inferiorly and laterally. The porta hepatis was noted. The neck of the gallbladder was mobilized with incision of the  visceral peritoneum. The cystic duct was dissected free from surrounding structures. The cystic artery was dissected free from surrounding structures. A critical view of safety was demonstrated. The cystic artery was controlled with double endoclips and divided. This allowed better traction of the cystic duct. An Endo Clip was placed across the cystic duct adjacent to the neck of the gallbladder. An incision was made in the cystic duct to introduce a Reddick catheter. Half-strength Conray-60 dye was injected as the cholangiogram was done under fluoroscopy demonstrating the biliary tree. There appeared to be some scatter of x-rays as images were somewhat blurred; however, it was done with the anesthetist holding the respirations, but could identify the biliary tree and prompt flow of dye into the duodenum. No retained stones were seen. There was a somewhat long cystic duct. The Reddick catheter was subsequently removed. The cystic duct was doubly ligated with endoclips and divided. The gallbladder was dissected free from the liver with hook and cautery. Two other small branches of vessels were controlled with endoclips and divided. The gallbladder was further dissected. It was a somewhat tedious dissection and eventually separated from the liver. Bleeding was very scant. Hemostasis was subsequently intact. A small amount of blood was aspirated and the site was irrigated with heparinized saline solution and aspirated.   The gallbladder was brought up through the supraumbilical incision, was opened and suction was necessary to dilate the fascial defect and also incise on the left side to make it slightly larger to allow traction of the gallbladder. The neck was then incised and the bile was suctioned out. Multiple stones were removed with the stone scoop. The gallbladder was submitted in formalin with stones for routine pathology. The fascia  was closed with a 0 Vicryl simple suture using the Endo Close needle. The  right upper quadrant was inspected. Hemostasis was intact. The cannulas were removed. Carbon dioxide was allowed to escape from the peritoneal cavity. Skin incisions were closed with 5-0 chromic subcuticular sutures for the two5 mm incisions. The other incisions were closed with 4-0 Monocryl. All incisions were dressed with benzoin, Steri-Strips, gauze and paper tape. The patient tolerated surgery satisfactorily and was then prepared for transfer to the recovery room. ____________________________ Lenna Sciara. Rochel Brome, MD jws:aw D: 01/07/2013 11:22:57 ET T: 01/07/2013 11:58:12 ET JOB#: [id number]  cc: Loreli Dollar, MD, <Dictator> Loreli Dollar MD ELECTRONICALLY SIGNED 01/07/2013 17:39

## 2014-10-03 NOTE — H&P (Signed)
PATIENT NAME:  Larry Callahan, Larry Callahan MR#:  [id number] DATE OF BIRTH:  Aug 26, 1955  DATE OF ADMISSION:  12/06/2012  PRIMARY CARE PROVIDER: Clayborn Bigness.   ED REFERRING PHYSICIAN: Dr. Jimmye Norman.   CHIEF COMPLAINT: Abdominal pain, nausea, vomiting, episode of diarrhea.   HISTORY OF PRESENT ILLNESS: The patient is a 59 year old white male with a history of diabetes, hypertension, hyperlipidemia, morbid obesity, sleep apnea, reflux, who reports that on Tuesday morning he went to [employer] and had a supper. After eating the supper he developed nausea, vomiting and diarrhea. These symptoms continue to persist. Therefore, he came to the ED. Started also having sharp epigastric pain. The patient came to the ED, and when he arrived in the ED they did an evaluation and noticed his lipase was elevated, so we were asked to admit the patient.   The patient reports that he has never had a history of any pancreatitis, and he reports that he does not drink. He reports that the nausea, vomiting and diarrhea is improved. He denies any hematemesis. No hematochezia. Denies any fevers or chills. Denies any chest pains or shortness of breath. He does have a chronic cough.   PAST MEDICAL HISTORY: 1.  Diabetes.  2.  Hypertension.  3.  Hyperlipidemia.  4.  Morbid obesity.  5.  Obstructive sleep apnea; uses CPAP at bedtime.  6.  GERD.   ALLERGIES: PENICILLIN.   CURRENT MEDICATIONS: At home: Patient on amlodipine, benazepril 5/20, 1 tablet p.o. b.i.d., gemfibrozil 600 mg 1 tablet p.o. b.i.d., aspirin 325 mg p.o. daily, glyburide, metformin 5/500 mg 1 tablet p.o. b.i.d., lovastatin 20 mg at bedtime, metoprolol extended-release 50 mg  1 tablet p.o. daily, omeprazole 40, 1 tablet p.o. daily.   SOCIAL HISTORY: The patient continues to smoke less than 1 pack per day. He states that he does not drink, does not use any drugs. Lives at home alone. He used to live with his brother, who has passed away.   FAMILY HISTORY: Significant  for diabetes on both mother and father's side of the family. Both mother and father are deceased. Died from complications of heart disease.   REVIEW OF SYSTEMS:  CONSTITUTIONAL: Denies any fevers, fatigue, weakness. Complains of abdominal pain. No weight loss or weight gain.  EYES: No blurred or double vision. No pain. No redness. No inflammation. No glaucoma. No cataracts.  ENT: No tinnitus. No ear pain. No hearing loss. No seasonal or year-round allergies. No difficulty swallowing.  RESPIRATORY: He has a chronic cough. Denies any wheezing. No hemoptysis. No dyspnea. No COPD.  CARDIOVASCULAR: Denies any chest pain, orthopnea, edema or arrhythmia. No palpitations. No syncope.  GASTROINTESTINAL: Complains of nausea, vomiting, diarrhea, abdominal pain. No hematemesis. No melena. No ulcers. No changes in bowel habits.  GENITOURINARY: Denies any dysuria, hematuria, renal calculus or frequency.  ENDOCRINE: Denies any polyuria, nocturia or thyroid problems.  HEMATOLOGIC/LYMPHATIC: Denies anemia, easy bruisability or bleeding.  SKIN: No acne. No rash. No changes in mole, hair or skin.  MUSCULOSKELETAL: No pain in the neck, back or shoulder.  NEUROLOGIC: No numbness. No CVA. No TIA. No seizures.  PSYCHIATRIC: No anxiety. No insomnia. No ADD.   PHYSICAL EXAMINATION: VITAL SIGNS: Temperature 98.2, pulse 63, respirations 18, blood pressure 188/80, O2 95%.  GENERAL: The patient is a morbidly obese male. Is not in any distress.  HEENT: Head normocephalic, atraumatic.  EYES: Pupils equally round, reactive to light and accommodation. There is no conjunctival pallor. No scleral icterus. Extraocular movements intact.  NOSE: There is  no lesion or drainage.  EARS: No erythema or lesions noted.  MOUTH: There is no exudate. Is moist.  NECK: Supple and symmetric. No masses. Thyroid midline, not enlarged. No JVD. Neck is nontender.  RESPIRATORY: Good respiratory effort. Clear to auscultation bilaterally without  any rales, rhonchi or wheezing.  CARDIOVASCULAR: Regular rate and rhythm. No murmurs, gallops, clicks, heaves or rubs.  ABDOMEN: There is mild epigastric tenderness. No guarding. No rebound. Positive bowel sounds x 4. There is no rigidity.  GENITOURINARY: Deferred.  MUSCULOSKELETAL: There is no erythema or swelling.  SKIN: There is no rash or any other lesions noted.  LYMPHATICS: Cervical lymph nodes or groin lymphadenopathy not palpable.  VASCULAR: Good DP, PT pulses.  NEUROLOGICAL: Cranial nerves II-XII grossly intact. Strength is 5/5 in all 4 extremities.  PSYCHIATRIC: The patient is not anxious or depressed.   LABORATORY AND RADIOLOGICAL DATA: Evaluation in the ED: Glucose 195, BUN 13, creatinine 0.99, sodium 136, potassium 3.7, chloride 104, CO2 is 22, calcium 9.4, lipase is 1109. Hemoglobin A1c 6.9.   Alcohol level was less than 0.003, total protein 7.6, albumin 3.4, bili total 0.5, alk phos 145, AST is 52, ALT 132.   WBC 16.4, hemoglobin 15.3, platelet count 195.   His abdominal x-ray showed bowel gas pattern, suggestive of ileus or gastroenteritis type of process.   CT scan of the abdomen shows sigmoid diverticulosis without diverticulitis, fat-filled umbilical hernia, stable left adrenal mass, right renal cyst, fatty infiltration of the liver. Minimal gallbladder sludge.   ASSESSMENT AND PLAN: The patient is a 59 year old white male with a history of diabetes, hypertension, hyperlipidemia, sleep apnea, presents with abdominal pain, nausea, vomiting, diarrhea. Noted to have elevated lipase.   1.  Acute pancreatitis: Possibly related to gallstones. Also could be related to an acute gastroenteritis-type of process. At this time will keep him n.p.o. GI consult, pain control. The patient eventually he may need a surgical evaluation for removal of his gallbladder. Will also check a fasting lipid panel in the morning.  2.  Mildly elevated liver function tests, possibly due to fatty liver:  Also could be related to #1. Will  monitor his LFTs.  3.  Diabetes: Will hold his oral regimen, place him on sliding-scale.  4.  Accelerated hypertension: I will continue his metoprolol, use p.r.n. hydralazine.  5.  Sleep apnea: Continue CPAP, as taking at bedtime.  6.  Nicotine addiction: The patient was counseled by me regarding importance of smoking cessation. Smoking cessation information given. He was also offered a nicotine patch. Time spent on nicotine addiction: Four minutes.  7.  Miscellaneous: Will place him on Lovenox for DVT prophylaxis.   Note: Forty-five minutes spent on the H and P.     ____________________________ Lafonda Mosses. Posey Pronto, MD shp:dm D: 12/06/2012 13:47:02 ET T: 12/06/2012 14:27:41 ET JOB#: [id number]  cc: Hakim Minniefield H. Posey Pronto, MD, <Dictator> Alric Seton MD ELECTRONICALLY SIGNED 12/06/2012 18:23

## 2014-10-03 NOTE — Consult Note (Signed)
PATIENT NAME:  Larry Callahan, Larry Callahan MR#:  [id number] DATE OF BIRTH:  May 10, 1956  DATE OF CONSULTATION:  12/07/2012  REFERRING PHYSICIAN:   CONSULTING PHYSICIAN:  Loreli Dollar, MD  HISTORY OF PRESENT ILLNESS:  This 59 year old male was admitted emergently with a chief complaint of abdominal pain.  He was initially evaluated by the Emergency Room staff and was admitted on the internal medicine service and presents for a general surgery consultation with abdominal pain.  He reports that three evenings ago he consumed a meal from [facility] and then two days ago in the morning developed a severe epigastric pain, also developed nausea, but no vomiting.  Had three episodes of diarrhea, had no rectal bleeding.  He has continued to have epigastric abdominal pain which is in the midline of the epigastrium.  No radiation into his back.  When he was in the Emergency Room he did have an elevated lipase and he was admitted with a diagnosis of pancreatitis, has been receiving some IV fluids and omeprazole.  Initially was kept nothing by mouth, but has taken a clear liquid diet and reports exacerbation of his pain after taking the clear liquid diet and has not been out of bed since he has been in the hospital, continuing to take frequent doses of Dilaudid.  Has had CT scanning which was suspicious of some sludge in the gallbladder.   PAST MEDICAL HISTORY:  Does include:  1.  Non-insulin-dependent diabetes mellitus.  2.  Hypertension.  3.  Hyperlipidemia.  4.  Sleep apnea.  Does use CPAP machine.  5.  Chronic gastroesophageal reflux.  6.  Morbid obesity.  7.  There is no history of heart disease or specific lung disease.  PAST SURGICAL HISTORY: 1.  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  2.  History of a traumatic injury of the right great toe requiring surgery.  3.  Has two metal plates in his left forearm due to a history of fracture.   MEDICATIONS:  Include amlodipine/benazepril 5 mg/20 by mouth 2 times per  day, enteric-coated aspirin 325 mg daily, gemfibrozil 600 mg 2 times a day, glyburide with metformin 5/500 2 times per day, lovastatin 20 mg at bedtime, metoprolol 50 mg daily, omeprazole 40 mg daily.   ALLERGIES:  PENICILLIN.   SOCIAL HISTORY:  Does smoke less than a pack of cigarettes per day.  He does not drink any alcohol.   FAMILY HISTORY:  Positive for diabetes and heart disease.   REVIEW OF SYSTEMS:  He reports no recent acute illness such as cough, cold or sore throat.  He reports no recent visual or auditory changes.  He reports no specific dyspnea on exertion, but has been aware of some wheezing while he has been in the hospital.  He reports no exertional chest pain.  No palpitations.  He reports he voids satisfactorily with no urinary symptoms.  He reports no ankle edema.  No recent sores or boils.  He reports no neurologic symptoms.  No anxiety or depression.  He reports stable weight.  Review of systems otherwise negative.   PHYSICAL EXAMINATION: GENERAL:  He is awake, alert and oriented, resting in the hospital bed.  Does have intermittent compression devices on his legs.  His height is 5 foot 7, weight is 262 pounds.  BMI is 41.1.  VITAL SIGNS:  Temperature is 97.7, pulse 63, respirations 18, blood pressure 183/89, oxygen saturation 92% on 2 liters of oxygen.  SKIN:  Appears to be somewhat tanned  without rash.  HEENT:  Pupils equal, reactive to light.  Extraocular movements are intact.  Sclerae clear.  Pharynx clear.   NECK:  There is an old scar on the right side of his neck from a previous salivary gland surgery.  There is no jugular venous distention.  LUNGS:  Sounds with a few scattered wheezes.  No dependent rales.  HEART:  Regular rhythm, S1 and S2.  ABDOMEN:  Very obese, soft.  There is mild mid epigastric tenderness.  There is no right upper quadrant tenderness.  Murphy sign is negative.  The navel is with an approximately 3 cm bulge which is soft and reducible.  There was a  large lower abdominal pannus.  EXTREMITIES:  With no dependent edema.  NEUROLOGIC:  Awake, alert, oriented, moving all extremities.  Affect seems appropriate.   CLINICAL DATA:  His chemistry panel with a creatinine of 0.73.  His initial lipase yesterday was 1109 and today is 278.  His liver panel on admission with an alkaline phosphatase of 145, today is 103.  On admission SGOT was 52, today is 23.  On admission SGPT was 132, today is 82.  White blood count on admission was 16,400, today is 16,200.  Hemoglobin was on admission 15,300, today is 13,900.    Urinalysis with no bacteria seen.  CT scan suggests there is some layering of bile within the gallbladder suggesting minimal amount of sludge, but no definite stone seen.  The pancreas was unremarkable.  There are renal cysts present in the right kidney.   I have reviewed these images.  There is some minimal evidence of layering of bile in the gallbladder.  There is a large cyst in the right kidney.  There is some minimal degree of distention of small bowel containing oral contrast.  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   IMPRESSION:  1.  Acute pancreatitis.  2.  Morbid obesity.   RECOMMENDATIONS:  I recommended nothing by mouth at present, IV fluids, continued use of omeprazole to decrease stomach acid.  I also recommended ultrasound of the gallbladder.  We will order that for further evaluation.  I have discussed pancreatitis.  We will need to give some time for her swelling and inflammation to resolve and hopefully for pain to resolve and based on the ultrasound we will make further recommendations.      ____________________________ Lenna Sciara. Rochel Brome, MD jws:ea D: 12/07/2012 19:36:30 ET T: 12/07/2012 21:52:49 ET JOB#: [id number]  cc: Loreli Dollar, MD, <Dictator> Loreli Dollar MD ELECTRONICALLY SIGNED 12/09/2012 16:50

## 2014-10-03 NOTE — Consult Note (Signed)
Chief Complaint:  Subjective/Chief Complaint Still having trouble tolerating clear liquids. Has increasing abd pain afterwards. Lipase remain normal. Gallstones found in GB. No acute cholecystitis.   VITAL SIGNS/ANCILLARY NOTES: **Vital Signs.:   29-Jun-14 04:57  Vital Signs Type Routine  Temperature Temperature (F) 98  Celsius 36.6  Temperature Source oral  Pulse Pulse 65  Respirations Respirations 18  Systolic BP Systolic BP 940  Diastolic BP (mmHg) Diastolic BP (mmHg) 89  Mean BP 111  Pulse Ox % Pulse Ox % 95  Pulse Ox Activity Level  At rest  Oxygen Delivery Room Air/ 21 %   Brief Assessment:  GEN no acute distress   Cardiac Regular   Respiratory clear BS   Gastrointestinal mild abd tenderness   Lab Results: Hepatic:  29-Jun-14 05:51   Bilirubin, Total 0.5  Alkaline Phosphatase 90  SGPT (ALT) 39  SGOT (AST)  14  Total Protein, Serum  6.2  Albumin, Serum  2.3  Routine Chem:  29-Jun-14 05:51   Lipase 155 (Result(s) reported on 09 Dec 2012 at 06:49AM.)  Glucose, Serum  125  BUN 11  Creatinine (comp) 0.70  Sodium, Serum 141  Potassium, Serum  3.4  Chloride, Serum  108  CO2, Serum 26  Calcium (Total), Serum 8.5  Osmolality (calc) 282  eGFR (African American) >60  eGFR (Non-African American) >60 (eGFR values <36m/min/1.73 m2 may be an indication of chronic kidney disease (CKD). Calculated eGFR is useful in patients with stable renal function. The eGFR calculation will not be reliable in acutely ill patients when serum creatinine is changing rapidly. It is not useful in  patients on dialysis. The eGFR calculation may not be applicable to patients at the low and high extremes of body sizes, pregnant women, and vegetarians.)  Anion Gap 7   Radiology Results: [place]    28-Jun-14 11:14, [place]  [place]   REASON FOR EXAM:    abdominal pain, pancreatitis  COMMENTS:       PROCEDURE: [place] - [place]  - Dec 08 2012 11:14AM     RESULT: The study is limited due to the patient's body habitus and the   presence of bowel gas. The liver is enlarged measuring 22.7 cm in   greatest dimension. In the left hepatic lobe there is a hypoechoic focus   measuring 3.9 x 2.6 x 4.5. Portal venous flow is normal in direction   toward the liver. The overall hepatic echotexture is increased.    The gallbladder is adequately distended with echogenic mobile shadowing   stones demonstrated. There is no positive sonographic Murphy's sign.   There is a small amount of pericholecystic fluid but there is no   significant gallbladder wall thickening. The pancreas was obscured by     bowel gas. The spleen is normal in size. The abdominal aorta exhibits   maximal diameter of 3.5 cm. The left kidney is surgically absent. The   right kidney exhibits compensatory hypertrophy measuring 15.2 cm in   greatest dimension. There is no evidence of the reduction but there are   multiple large cysts in the right kidney.    IMPRESSION:    1. There are multiple gallstones present. There is a small amount of   pericholecystic fluid but there is no positive sonographic Murphy's sign   nor definite wall thickening.  2. The pancreas could not be demonstrated due to the presence of bowel   gas.  3. The liver  exhibits a hypoechoic region in the left lobe which was not   demonstrated on the previous CT scan of 06 December 2012. This may be   artifactual.  4. There are multiple cysts associated with the enlarged right kidney.   The left kidney is surgically absent.  5. The abdominal aorta was suspected to measure 3.5 cm in diameter, but   the recent CT scan revealed a normal calibered abdominal aorta. Today's   study was limited by the patient's body habitus and bowel gas.     Dictation Site: 5        Verified By: DAVID A. Martinique, M.D., MD   Assessment/Plan:  Assessment/Plan:  Assessment Gallstone pancreatitis. Pancreatitis appears to  have resolved. However, patient remains symptomatic.   Plan Dr. Tamala Julian to evaluate. Will prob need GB removed sooner or later. Will increase PPI to BID in case patient has PUD. thanks.   Electronic Signatures: Verdie Shire (MD)  (Signed 29-Jun-14 08:58)  Authored: Chief Complaint, VITAL SIGNS/ANCILLARY NOTES, Brief Assessment, Lab Results, Radiology Results, Assessment/Plan   Last Updated: 29-Jun-14 08:58 by Verdie Shire (MD)

## 2014-10-05 NOTE — Op Note (Signed)
PATIENT NAME:  Larry Callahan, Larry Callahan MR#:  [id number] DATE OF BIRTH:  04-09-1956  DATE OF PROCEDURE:  09/06/2011  PREOPERATIVE DIAGNOSIS: Lower back subcutaneous abscess.  POSTOPERATIVE DIAGNOSIS: Lower back subcutaneous abscess consistent with an infected sebaceous cyst.   PROCEDURE PERFORMED: Incision and drainage of infected sebaceous cyst.   SURGEON: Christine Schiefelbein A. Marina Gravel, MD  ASSISTANT: None.   ANESTHESIA: 30 mL 0.5% Marcaine with epinephrine.   DESCRIPTION OF PROCEDURE: After informed consent, patient was positioned lateral. The area around the lesion was sterilely prepped and draped with Betadine. Timeout was observed.   A field block was created around the lesion circumferentially with careful infiltration utilizing the above local anesthetic. The area of drainage was found and opened sharply with scalpel in a cruciate manner. A small amount of purulent material was drained. The cavity was debrided with a 4 x 4 gauze and pieces of the cyst cavity were extruded. The wound was then packed open with a 4 x 4 gauze covered with 4 x 4's, ABD and tape. Patient tolerated procedure well without immediate complication.   ESTIMATED BLOOD LOSS: Minimal.   ____________________________ Jeannette How. Marina Gravel, MD mab:cms D: 09/06/2011 22:28:15 ET T: 09/07/2011 09:11:19 ET JOB#: [id number]  cc: Elta Guadel[place] A. Marina Gravel, MD, <Dictator> Lavera Guise, MD [place] MD ELECTRONICALLY SIGNED 09/08/2011 11:12

## 2014-10-05 NOTE — Consult Note (Signed)
PATIENT NAME:  Larry Callahan, Larry Callahan MR#:  [id number] DATE OF BIRTH:  09-Sep-1955  DATE OF CONSULTATION:  09/05/2011  REFERRING PHYSICIAN:  Sherri Rad, MD  CONSULTING PHYSICIAN:  Belia Heman. Verdell Carmine, MD  PRIMARY CARE PHYSICIAN: Clayborn Bigness, MD   REASON FOR CONSULTATION: Uncontrolled diabetes and medical management.   HISTORY OF PRESENT ILLNESS: This is a 59 year old male with past medical history of diabetes, hypertension, obesity, and obstructive sleep apnea who presented to the hospital due to noticed to have a boil on his lower back which has progressively been getting worse. The patient was noted to have some element of cellulitis with possible abscess formation on his lower back. He has been admitted to the surgical service and started on IV antibiotics. Hospitalist services were contacted for management of his diabetes and hypertension.   The patient presently denies any chest pain, shortness of breath, nausea, vomiting, abdominal pain, fevers, chills, cough, or any other associated symptoms. The only complaint he has right now is some lower back pain.   REVIEW OF SYSTEMS: CONSTITUTIONAL: No documented fever. No weight gain, no weight loss. EYES: No blurry or double vision. ENT: No tinnitus, no postnasal drip, no redness of the oropharynx. RESPIRATORY: No cough, no wheeze, no hemoptysis, no dyspnea. CARDIOVASCULAR: No chest pain, no orthopnea, no palpitations, no syncope. GI: No nausea, no vomiting, no diarrhea, no abdominal pain, no melena, no hematochezia. GU: No dysuria, no hematuria. ENDOCRINE: No polyuria or nocturia. No heat or cold intolerance. HEME: No anemia, no bruising, no bleeding. INTEGUMENTARY: He does have a lower back cellulitis. NEUROLOGIC: No numbness, no tingling, no ataxia, no seizure-type activity. PSYCH: No anxiety, no insomnia, no ADD.   PAST MEDICAL HISTORY:  1. Diabetes.  2. Hypertension.  3. Hyperlipidemia.  4. Obstructive sleep apnea.  5. Gastroesophageal reflux disease.    ALLERGIES: Penicillin.   SOCIAL HISTORY: Used to be a smoker, quit about three months or so ago. No alcohol abuse. No illicit drug abuse. Lives at home with his brother.   FAMILY HISTORY: Significant for diabetes on both mother and father's side of the family. Both mother and father are deceased, died from complications of heart disease.   CURRENT MEDICATIONS:  1. Amlodipine 10 mg 2 tabs b.i.d.  2. Amlodipine/benazepril 5/20 one cap b.i.d.  3. Gemfibrozil 600 mg 1 tab b.i.d.  4. Glucovance 5/500 2 tabs b.i.d.  5. Glyburide 5 mg b.i.d.  6. Lopressor 50 mg b.i.d.  7. Lovastatin 20 mg daily.   PHYSICAL EXAMINATION:   VITAL SIGNS: Temperature 98.3, pulse 86, respirations 22, blood pressure 156/75, sats 94% on room air.   GENERAL: He is an obese male lying in bed in no apparent distress.   HEENT: Atraumatic, normocephalic. His extraocular muscles are intact. Pupils are equal and reactive to light. Sclerae anicteric. No conjunctival injection. No oropharyngeal erythema.   NECK: Supple. No jugular venous distention, no bruits, no lymphadenopathy, no thyromegaly.   HEART: Regular rate and rhythm. No murmurs, no rubs, no clicks.   LUNGS: Clear to auscultation bilaterally. No rales, no rhonchi, no wheezes.   ABDOMEN: Soft, flat, nontender, nondistended. Has good bowel sounds. No hepatosplenomegaly appreciated.   EXTREMITIES: There is no evidence of cyanosis, clubbing, or peripheral edema. Has +2 pedal and radial pulses bilaterally.   NEUROLOGIC: The patient is alert, awake, and oriented x3 with no focal motor or sensory deficits appreciated bilaterally.   SKIN: Moist and warm. He does have a lower back boil which has been lanced. It has  some surrounding erythema and fluctuance and evidence of mild cellulitis.   NEUROLOGICAL: He is alert, awake, and oriented x3 with no focal motor or sensory deficits appreciated bilaterally.   SKIN: Moist and warm with no rashes appreciated.    LYMPHATIC: There is no cervical or axillary lymphadenopathy.   LABORATORY, DIAGNOSTIC, AND RADIOLOGICAL DATA: Serum glucose 386, BUN 21, creatinine 1.06, sodium 140, potassium 4.4, chloride 103, bicarb 24. Hemoglobin A1c 11.1. LFTs are within normal limits. Troponin less than 0.02. White cell count 9.6, hemoglobin 12.8, hematocrit 39.0, platelet count 154. Prothrombin time 7.9. INR 0.8.   ASSESSMENT AND PLAN: This is a 59 year old male with history of diabetes, hypertension, obstructive sleep apnea, obesity, and history of left-sided nephrectomy who came into the hospital due to lower back pain and noted to have an abscess/cellulitis.  1. Diabetes. The patient has uncontrolled diabetes likely due to noncompliance. He probably does not follow a strict diet although he does take his meds appropriately. His hemoglobin A1c is high at 11. He is currently on Glucovance, glyburide, and sliding scale insulin. I will continue those for now but will go ahead and add Lantus low dose at 20 units and follow his blood sugars. Keep him on a carb-controlled diet. I have a feeling he's likely going to need to be on insulin prior to discharge. I will go ahead and check a lipid profile too.  2. Hypertension. His blood pressure is also currently uncontrolled. Will continue his Norvasc and benazepril and continue his metoprolol but I will go ahead and increase the metoprolol to get better blood pressure control.  3. Lower back abscess/cellulitis. Continue IV ceftriaxone for now and care as per Surgery. Continue pain control as per Surgery.  4. Gastroesophageal reflux disease. Continue Protonix.  5. Obstructive sleep apnea. Continue CPAP as needed.   Thanks for the consult. Will follow along with you.     TIME SPENT WITH THE CONSULT: 50 minutes.   ____________________________ Belia Heman. Verdell Carmine, MD vjs:drc D: 09/05/2011 11:14:37 ET T: 09/05/2011 11:24:23 ET JOB#: [id number]  cc: Belia Heman. Verdell Carmine, MD,  <Dictator> Lavera Guise, MD Henreitta Leber MD ELECTRONICALLY SIGNED 09/08/2011 10:47

## 2014-10-05 NOTE — Consult Note (Signed)
Brief Consult Note: Diagnosis: 1. Uncontrolled DM 2. HTN 3. Hyperlipidemia 4. Obesity 5. Lower back abscess.   Patient was seen by consultant.   Consult note dictated.   Orders entered.   Comments: 53 w/ hx of DM, HtN, OSA, Obesity, hx of left sided nephrectomy came into hospital due to lower back abscess/cellulitis.   1. DM - uncontrolled due to non-compliance.  HemogobinA1c is 11.  - currently on Glucovance, Glyburide which will cont.  Cont. SSI and also will add low dose Lantus and monitor.  Will likely need to be insulin prior to discharge.  - will check LIpid profile.  2. HTN - also uncontrolled.  Will cont. Norvasc, Benzapril and will increase dose of Metoprolol and monitor.  3. Lower back abscess/cellulitis - cont. IV abx and care as per surgery. cont. pain-control as per surgery. 4. GERD - cont. Protonix.  5. OSA - CPAP as needed.   Thanks for the consult and will follow with you.   Job# [id number].  Electronic Signatures: Henreitta Leber (MD)  (Signed 25-Mar-13 11:14)  Authored: Brief Consult Note   Last Updated: 25-Mar-13 11:14 by Henreitta Leber (MD)

## 2014-10-05 NOTE — Discharge Summary (Signed)
PATIENT NAME:  Larry Callahan, Larry Callahan MR#:  [id number] DATE OF BIRTH:  05-04-1956  DATE OF ADMISSION:  09/04/2011 DATE OF DISCHARGE:  09/08/2011  FINAL DIAGNOSES:  1. Infected sebaceous cyst lower back.  2. Obesity.  3. Type 1 diabetes.  4. Hypertension.  5. Sleep apnea.  6. History of coronary artery disease and myocardial infarction.   HOSPITAL COURSE: The patient was admitted to the surgical service. Medicine was consulted regarding management of his diabetes. A large carbuncle was seen on his lower back and treated with antibiotics and warm compresses. On the 26th this started draining more purulent material and was drained more widely at the bedside on the 26th of March. Findings were consistent at that time of an infected sebaceous cyst. The patient had an elevated hemoglobin A1c and was initiated on subcutaneous insulin injections and was taught diabetes management for this. He was deemed suitable for discharge on oral antibiotics and dressing changes with home health assistance on the 28th of March. He will follow-up in my office in one week's time.   ____________________________ Jeannette How Marina Gravel, MD mab:rbg D: 09/21/2011 14:02:20 ET T: 09/22/2011 11:27:15 ET JOB#: [id number]  cc: Elta Guadeloupe A. Marina Gravel, MD, <Dictator> Lavera Guise, MD Abdirahman Chittum Bettina Gavia MD ELECTRONICALLY SIGNED 09/22/2011 12:14

## 2014-10-05 NOTE — H&P (Signed)
Subjective/Chief Complaint 3-4 days of pain in lower back, vomiting blood.    History of Present Illness 59 y/o male presents to ER with 3-4 days of worse pain around a large boil on his lower back.  Also noted earlier today that he was vomiting up blood.  Also complaint of shaking chills.  He also noted a small discoloration on his right wrist earlier today.    Past History obesity type 2 DM hypertension h/o MI. s/p left nephrectomy.    Past Medical Health Coronary Artery Disease, Hypertension, Diabetes Mellitus    Primary Physician Stephanie Coup, [facility]   Past Med/Surgical Hx:  MI - Myocardial Infarct:   Diabetes Mellitus, Type II (NIDD):   sleep apnea:   Hypertension:   ALLERGIES:  Penicillin: Rash    Other Allergies none   HOME MEDICATIONS: Medication Instructions Status  gemfibrozil 600 mg oral tablet 1 cap(s) orally 2 times a day  Active  glyBURIDE 5 mg oral tablet 1  orally 2 times a day  Active  amlodipine 10 mg oral tablet 2  orally 2 times a day  Active  lovastatin 20 mg oral tablet 1 cap(s) orally 2 times a day  Active  Lopressor 50 mg oral tablet 1  orally 2 times a day  Active  amlodipine-benazepril 5 mg-20 mg capsule 1 cap(s) orally 2 times a day  Active  Glucovance 5 mg-500 mg oral tablet 2 tab(s) orally 2 times a day  Active  Lopid 600 mg oral tablet 1 tab(s) orally 2 times a day  Active   Family and Social History:   Family History Non-Contributory    Social History negative tobacco, negative ETOH, negative Illicit drugs    Place of Living Home   Review of Systems:   Subjective/Chief Complaint see above    Fever/Chills Yes    Cough No    Sputum No    Abdominal Pain No    Diarrhea No    Constipation No   Physical Exam:   GEN NAD, obese    HEENT pale conjunctivae    NECK supple    RESP normal resp effort  clear BS    CARD regular rate    ABD denies tenderness    LYMPH negative neck    SKIN normal to palpation, 4-5 cm  area on lower midline back of cellulitis and no purulent drainage, very tender to palpation.    NEURO cranial nerves intact    PSYCH alert, A+O to time, place, person   Routine Hem:  24-Mar-13 16:27    WBC (CBC) 10.2   RBC (CBC) 4.65   Hemoglobin (CBC) 14.3   Hematocrit (CBC) 41.8   Platelet Count (CBC) 160   MCV 90   MCH 30.8   MCHC 34.2   RDW 13.5  Routine Chem:  24-Mar-13 16:27    Glucose, Serum 386   BUN 21   Creatinine (comp) 1.06   Sodium, Serum 140   Potassium, Serum 4.4   Chloride, Serum 103   CO2, Serum 24   Calcium (Total), Serum 9.4  Hepatic:  24-Mar-13 16:27    Bilirubin, Total 0.3   Alkaline Phosphatase 70   SGPT (ALT) 24   SGOT (AST) 17   Total Protein, Serum 8.1   Albumin, Serum 3.3  Routine Chem:  24-Mar-13 16:27    Osmolality (calc) 298   eGFR (African American) >60   eGFR (Non-African American) >60   Anion Gap 13  Cardiac:  24-Mar-13 16:27  Troponin I < 0.02  Routine Coag:  24-Mar-13 16:27    Prothrombin 11.9   INR 0.8     Assessment/Admission Diagnosis 59 y/o obese male with large carbuncle on lower middle of back, uncontrolled diabetes, and report of hematemesis.    Plan Admit, serial hgb, IV vancomycin and rocephin, warm compresses, may possibly need I and D  consult [facility] in am.   total time spent 35 minutes.   Electronic Signatures: Sherri Rad (MD)  (Signed [phone] 22:27)  Authored: CHIEF COMPLAINT and HISTORY, PAST MEDICAL/SURGIAL HISTORY, ALLERGIES, Other Allergies, HOME MEDICATIONS, FAMILY AND SOCIAL HISTORY, REVIEW OF SYSTEMS, PHYSICAL EXAM, LABS, ASSESSMENT AND PLAN   Last Updated: 24-Mar-13 22:27 by Sherri Rad (MD)

## 2014-10-05 NOTE — H&P (Signed)
PATIENT NAME:  Larry Callahan, Larry Callahan MR#:  [id number] DATE OF BIRTH:  02/12/1956  DATE OF ADMISSION:  09/04/2011  ADMITTING DIAGNOSES:  Diabetes mellitus, hypertension, sleep apnea, history of myocardial infarction, large carbuncle on his lower back.   HISTORY: This 59 year old white male presented to the Emergency Room with a 3 to 4-day history of worsening pain around a large boil on his lower back. He noted earlier today that he was vomiting up blood and also complaining of shaking chills.     PRIMARY CARE PHYSICIAN:  Dr. Humphrey Rolls  PAST MEDICAL HISTORY:  1. Obesity.  2. Type 2 diabetes, poorly controlled. 3. Hypertension.  4. History of myocardial infarction.  5. History of status post left nephrectomy for tumor   PAST SURGICAL HISTORY: Left nephrectomy.   SOCIAL HISTORY: Ex-smoker, quit smoking three months ago. No alcohol use. No illicit drug use. Lives at home with his brother.  FAMILY HISTORY: Significant for diabetes and heart disease.   HOME MEDICATIONS: 1. Amlodipine. 2. Amlodipine benazepril.  3. Gemfibrozil.  4. Glucovance.  5. Glyburide.  6. Lopressor.  7. Lovastatin.   PHYSICAL EXAMINATION:   GENERAL: Morbidly obese white male in no obvious distress.   VITAL SIGNS: Temperature 98.3, pulse 86, respiratory rate 22, blood pressure 156/75, weight 294.2 pounds, height 70.9 inches, BMI 41.1.   LUNGS: Clear.   HEART: Regular rate and rhythm.   ABDOMEN: Obese, soft. There is a left flank incision.  BACK:  On the lower back there is a 4-cm area of carbuncle formation with extensive cellulitis. There is one punctate area of skin, which appears to be evolving necrosis.   EXTREMITIES: Warm and well perfused. There is some old bruise on his right wrist.   LABORATORY VALUES: Glucose 386, creatinine is 1.06, sodium 140, potassium 4.4, chloride 103, albumin 3.3. Liver function tests normal. Troponin less than 0.02. White count 10.2, hemoglobin 14.3, hematocrit 41.8, platelet count  is 160,000.   IMPRESSION: 59 year old white male with poorly controlled diabetes, hypertension, obesity,  sleep apnea, and large carbuncle on his lower back with cellulitis and a questionable history of hematemesis.   PLAN: The patient will be admitted. We will get serial hematocrit in the morning. I have asked medical services to consult regarding his hyperglycemia and further recommendations based on that will be followed.  I will place him on Rocephin and vancomycin for initiation of infected carbuncle. I will start warm compresses.  Incision and drainage will be performed based on clinical course.   TOTAL TIME SPENT: 35 minutes.   ____________________________ Jeannette How Marina Gravel, MD mab:bjt D: 09/05/2011 20:56:00 ET T: 09/06/2011 08:44:59 ET JOB#: [id number] Ghalia Reicks A Berthold Glace MD ELECTRONICALLY SIGNED 09/06/2011 23:41

## 2014-10-17 IMAGING — CT CT CHEST-ABD-PELV W/ CM
1 of 2 series · 14 of 32 positions shown, 19 images · IV contrast (APPLIED)
Comparison: none

REASON FOR EXAM: (1) pleuritic cp and sob tender r lower chest 3 weeks sp
surg; (2) tender ruq ab
COMMENTS:

PROCEDURE:     CT  - CT CHEST ABDOMEN AND PELVIS W  - March 06, 2013  [DATE]
RESULT:     History: Chest pain.
Comparison Study: CT chest 08/10/2011. CT abdomen 12/06/2012.

[Series 8: abdomen · axial · 0.92mm/px · z∈[-450,-6]mm · 14 of 166 slices shown, 19 images]
[im 9/166  soft-tissue]
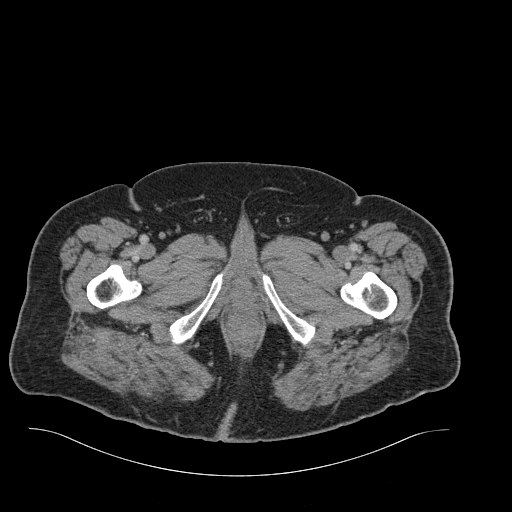
[im 9/166  bone]
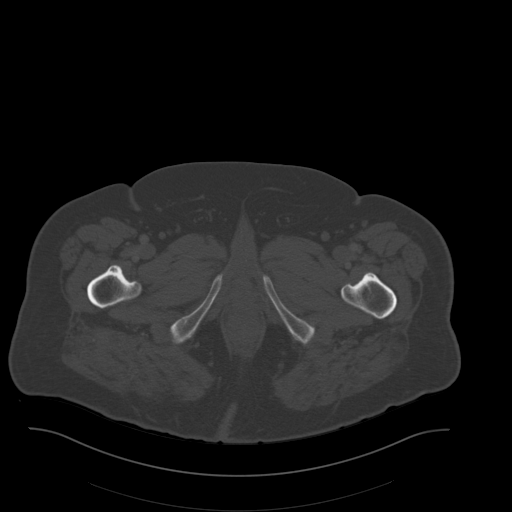
[im 27/166  soft-tissue]
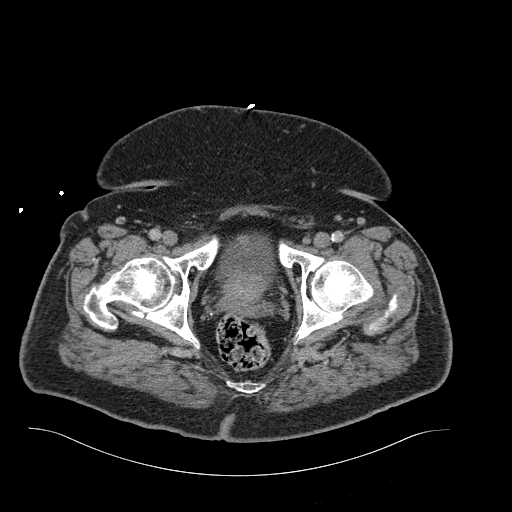
[im 35/166  soft-tissue]
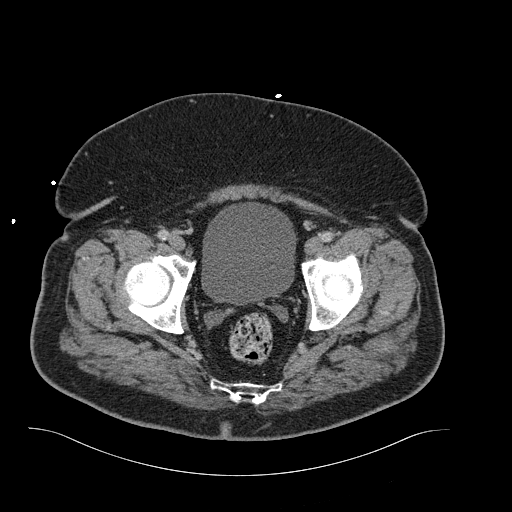
[im 44/166  soft-tissue]
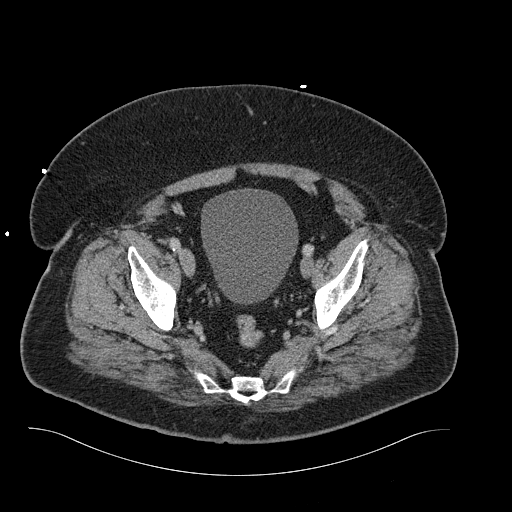
[im 61/166  soft-tissue]
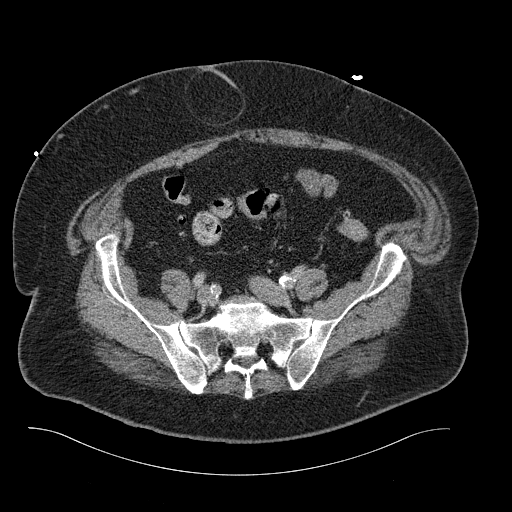
[im 70/166  soft-tissue]
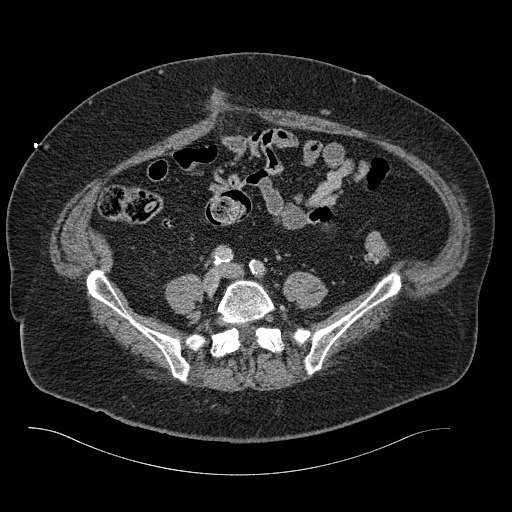
[im 87/166  soft-tissue]
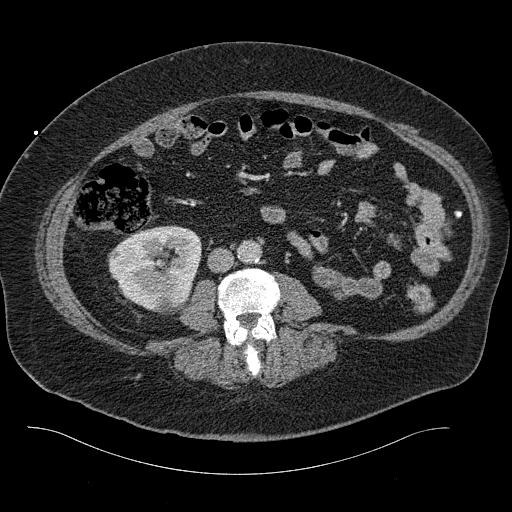
[im 96/166  soft-tissue]
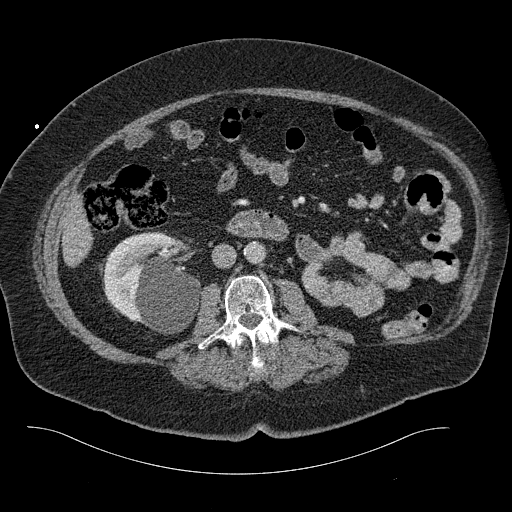
[im 105/166  soft-tissue]
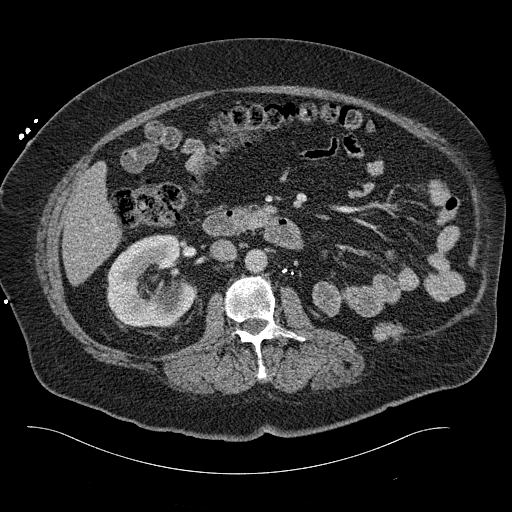
[im 105/166  bone]
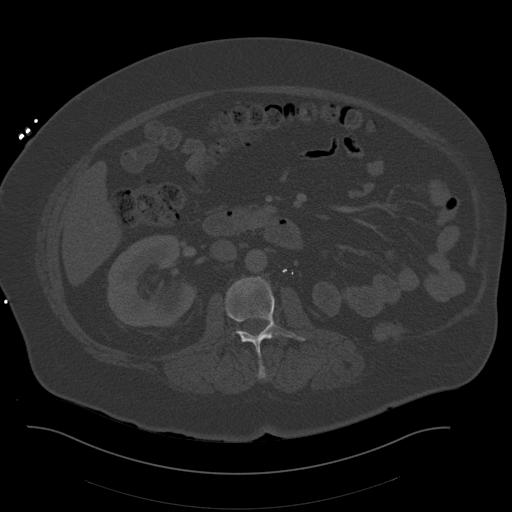
[im 122/166  soft-tissue]
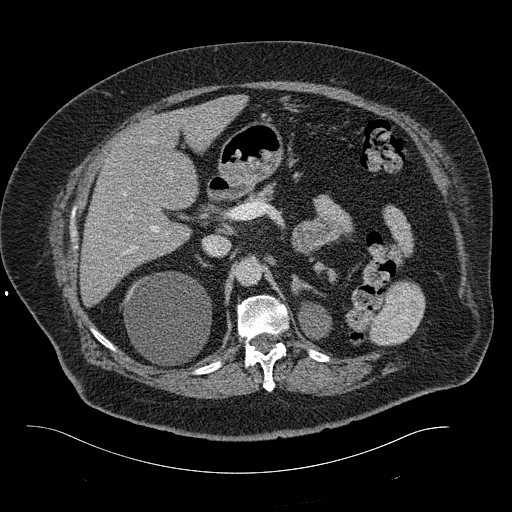
[im 131/166  soft-tissue]
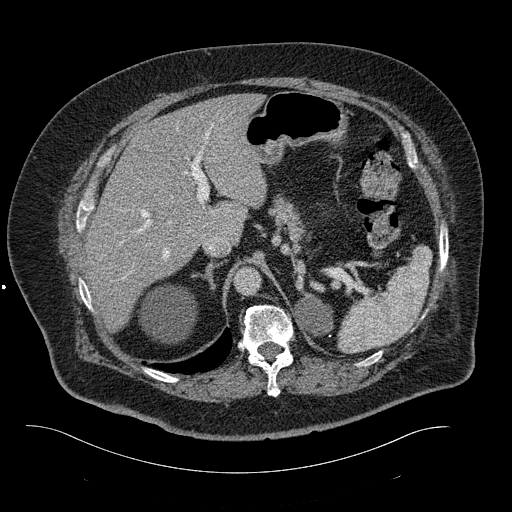
[im 131/166  lung]
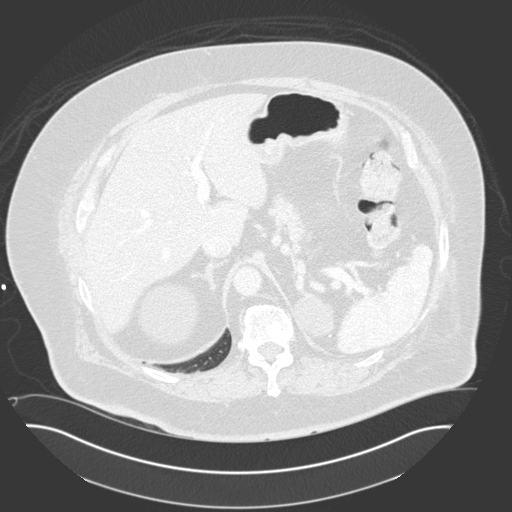
[im 139/166  soft-tissue]
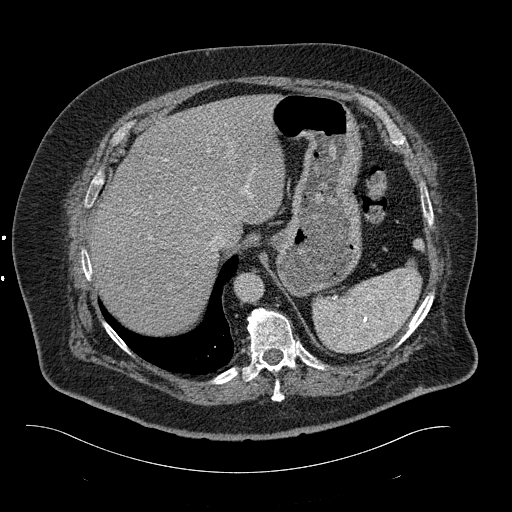
[im 139/166  lung]
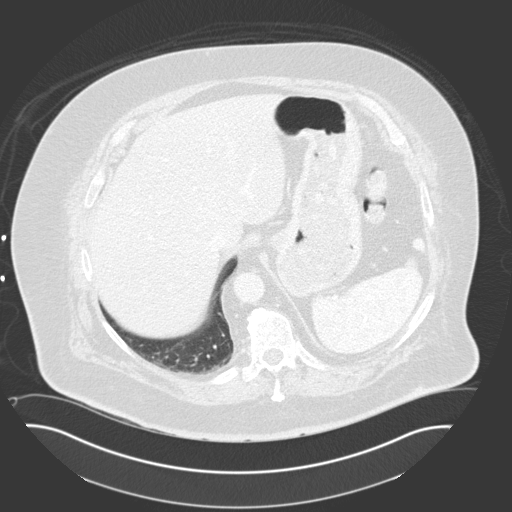
[im 148/166  lung]
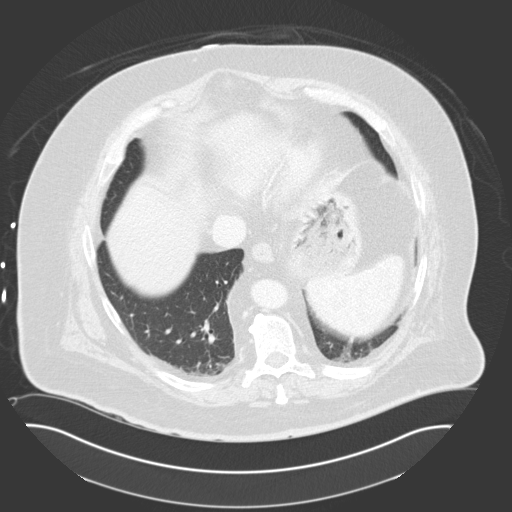
[im 157/166  soft-tissue]
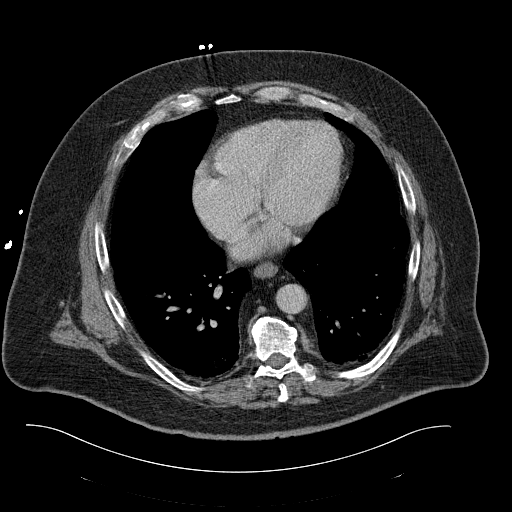
[im 157/166  lung]
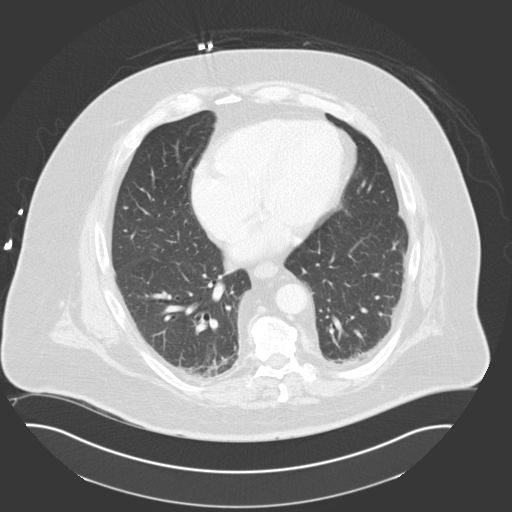

[14 of 32 positions shown; findings below may reference images not displayed]

FINDINGS: Standard CT obtained with 100 cc of Qsovue-[id number]. Evaluation in 3
dimensions on separate workstation performed. Lung CAD performed. Thoracic
aorta is normal. Pulmonary arteries are normal. Mediastinal and hilar
structures are normal. Large airways are patent. Mild atelectasis versus
scarring lung bases. Calcified granuloma right lung. Right adrenal
unremarkable. Large right renal cyst. Previously identified left adrenal
mass has enlarged to 4.7 cm in maximum diameter. Adrenal MRI suggested for
further evaluation. Biochemical analysis to evaluate for functioning adrenal
lesion including adrenal tumor should be considered. Calcified splenic
granulomas. Degenerative changes thoracic spine. Heart size normal.
IMPRESSION: 1. No acute cardiopulmonary abnormality identified.
2. Enlarging left adrenal lesion ,adrenal MRI suggested for further
evaluation. Biochemical analysis to evaluate for functioning adrenal lesion
including adrenal tumor should be considered.

## 2014-10-17 IMAGING — CR DG CHEST 2V
1 series · 2 of 2 positions shown · non-contrast
Comparison: none

REASON FOR EXAM: SOB; upper abdominal pain
COMMENTS:

[Series 1: w chest pa · 0.14mm/px · 2 of 2 slices shown]
[im 1/2]
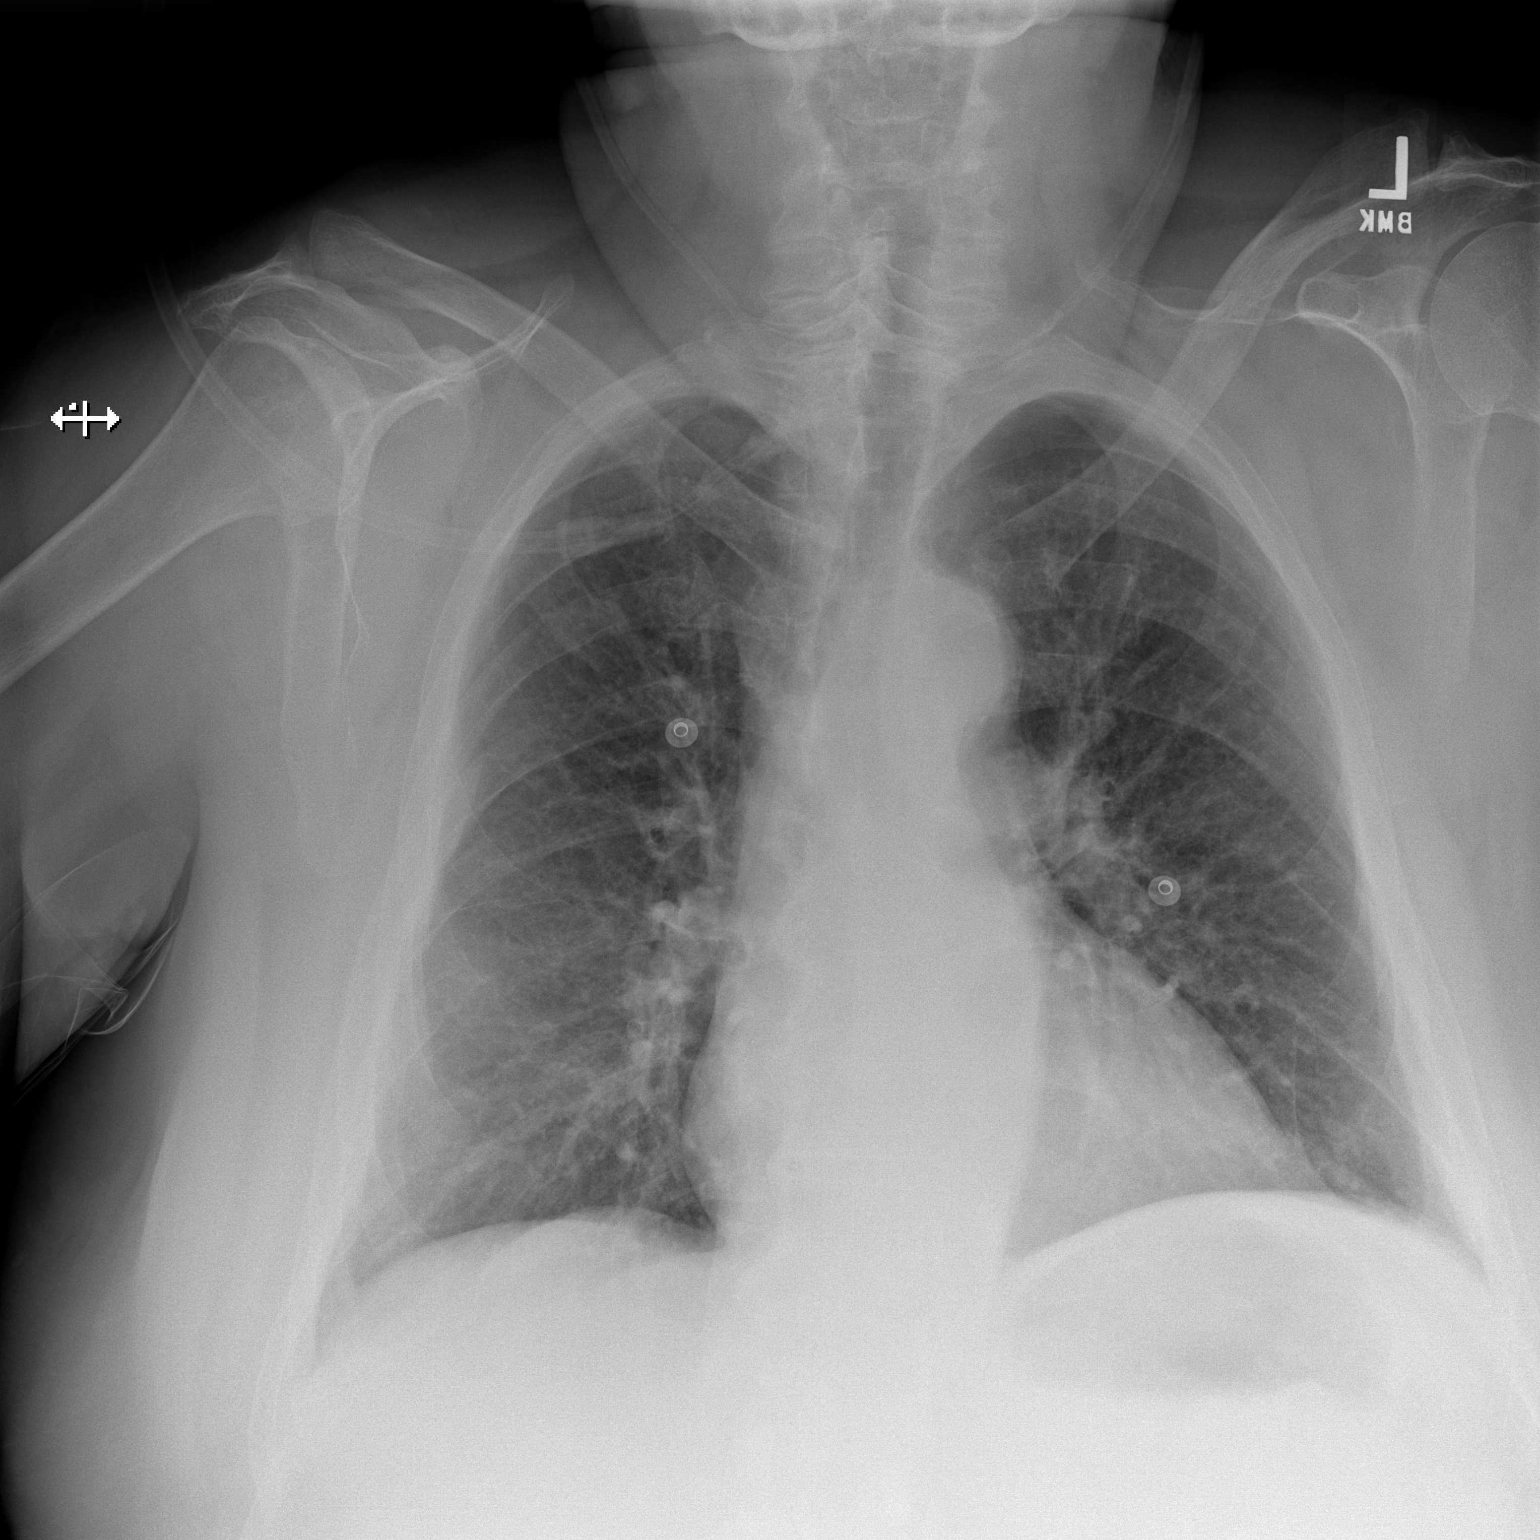
[im 2/2]
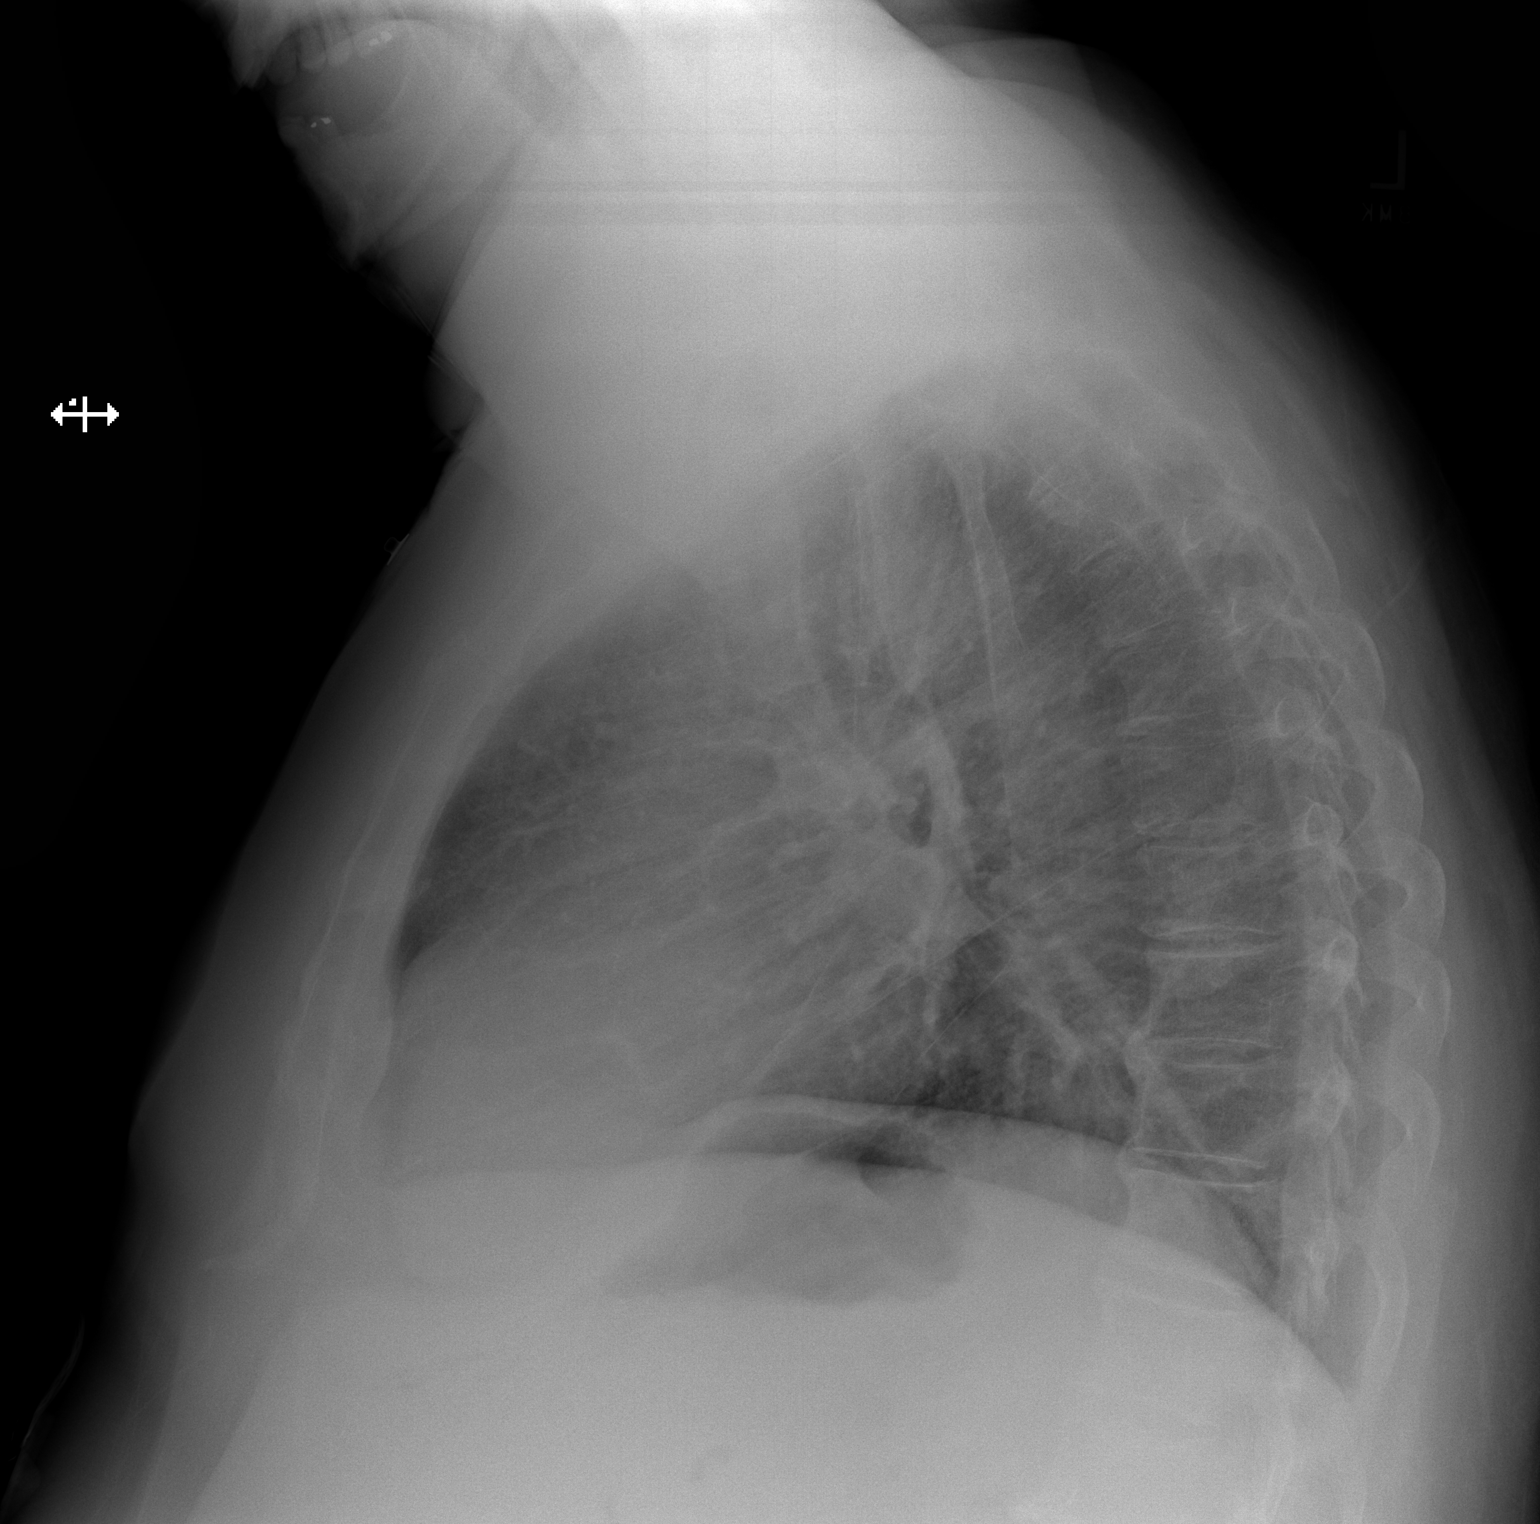

[2 of 2 positions shown; findings below may reference images not displayed]

PROCEDURE:     DXR - DXR CHEST PA (OR AP) AND LATERAL  - March 06, 2013  [DATE]

RESULT:     Comparison is made to the previous exam of 08/10/2011. The lungs
are clear. The heart and pulmonary vessels are normal. The bony and
mediastinal structures are unremarkable. There is no effusion. There is no
pneumothorax or evidence of congestive failure.
IMPRESSION: No acute cardiopulmonary disease.

[REDACTED]

## 2014-12-14 DIAGNOSIS — G4730 Sleep apnea, unspecified: Secondary | ICD-10-CM | POA: Diagnosis not present

## 2014-12-30 DIAGNOSIS — D441 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: Secondary | ICD-10-CM | POA: Diagnosis not present

## 2014-12-30 DIAGNOSIS — Z125 Encounter for screening for malignant neoplasm of prostate: Secondary | ICD-10-CM | POA: Diagnosis not present

## 2014-12-30 DIAGNOSIS — E1142 Type 2 diabetes mellitus with diabetic polyneuropathy: Secondary | ICD-10-CM | POA: Diagnosis not present

## 2014-12-30 DIAGNOSIS — G4733 Obstructive sleep apnea (adult) (pediatric): Secondary | ICD-10-CM | POA: Diagnosis not present

## 2015-01-14 DIAGNOSIS — G4730 Sleep apnea, unspecified: Secondary | ICD-10-CM | POA: Diagnosis not present

## 2015-02-04 DIAGNOSIS — E785 Hyperlipidemia, unspecified: Secondary | ICD-10-CM | POA: Diagnosis not present

## 2015-02-04 DIAGNOSIS — M1710 Unilateral primary osteoarthritis, unspecified knee: Secondary | ICD-10-CM | POA: Diagnosis not present

## 2015-02-04 DIAGNOSIS — F17200 Nicotine dependence, unspecified, uncomplicated: Secondary | ICD-10-CM | POA: Diagnosis not present

## 2015-02-04 DIAGNOSIS — Z6841 Body Mass Index (BMI) 40.0 and over, adult: Secondary | ICD-10-CM | POA: Diagnosis not present

## 2015-02-04 DIAGNOSIS — G4733 Obstructive sleep apnea (adult) (pediatric): Secondary | ICD-10-CM | POA: Diagnosis not present

## 2015-02-04 DIAGNOSIS — M47819 Spondylosis without myelopathy or radiculopathy, site unspecified: Secondary | ICD-10-CM | POA: Diagnosis not present

## 2015-02-04 DIAGNOSIS — E119 Type 2 diabetes mellitus without complications: Secondary | ICD-10-CM | POA: Diagnosis not present

## 2015-02-04 DIAGNOSIS — I10 Essential (primary) hypertension: Secondary | ICD-10-CM | POA: Diagnosis not present

## 2015-02-04 DIAGNOSIS — K219 Gastro-esophageal reflux disease without esophagitis: Secondary | ICD-10-CM | POA: Diagnosis not present

## 2015-02-14 DIAGNOSIS — G4730 Sleep apnea, unspecified: Secondary | ICD-10-CM | POA: Diagnosis not present

## 2015-03-16 DIAGNOSIS — G4730 Sleep apnea, unspecified: Secondary | ICD-10-CM | POA: Diagnosis not present

## 2015-03-18 DIAGNOSIS — E1142 Type 2 diabetes mellitus with diabetic polyneuropathy: Secondary | ICD-10-CM | POA: Diagnosis not present

## 2015-03-18 DIAGNOSIS — F17210 Nicotine dependence, cigarettes, uncomplicated: Secondary | ICD-10-CM | POA: Diagnosis not present

## 2015-03-18 DIAGNOSIS — G4733 Obstructive sleep apnea (adult) (pediatric): Secondary | ICD-10-CM | POA: Diagnosis not present

## 2015-03-18 DIAGNOSIS — J449 Chronic obstructive pulmonary disease, unspecified: Secondary | ICD-10-CM | POA: Diagnosis not present

## 2015-04-16 DIAGNOSIS — G4730 Sleep apnea, unspecified: Secondary | ICD-10-CM | POA: Diagnosis not present

## 2015-05-16 DIAGNOSIS — G4730 Sleep apnea, unspecified: Secondary | ICD-10-CM | POA: Diagnosis not present

## 2015-06-16 DIAGNOSIS — G4730 Sleep apnea, unspecified: Secondary | ICD-10-CM | POA: Diagnosis not present

## 2015-06-18 DIAGNOSIS — Z23 Encounter for immunization: Secondary | ICD-10-CM | POA: Diagnosis not present

## 2015-06-18 DIAGNOSIS — J301 Allergic rhinitis due to pollen: Secondary | ICD-10-CM | POA: Diagnosis not present

## 2015-06-18 DIAGNOSIS — Z0001 Encounter for general adult medical examination with abnormal findings: Secondary | ICD-10-CM | POA: Diagnosis not present

## 2015-06-18 DIAGNOSIS — Z125 Encounter for screening for malignant neoplasm of prostate: Secondary | ICD-10-CM | POA: Diagnosis not present

## 2015-06-18 DIAGNOSIS — F17210 Nicotine dependence, cigarettes, uncomplicated: Secondary | ICD-10-CM | POA: Diagnosis not present

## 2015-06-18 DIAGNOSIS — I10 Essential (primary) hypertension: Secondary | ICD-10-CM | POA: Diagnosis not present

## 2015-06-18 DIAGNOSIS — E1142 Type 2 diabetes mellitus with diabetic polyneuropathy: Secondary | ICD-10-CM | POA: Diagnosis not present

## 2015-07-17 DIAGNOSIS — G4730 Sleep apnea, unspecified: Secondary | ICD-10-CM | POA: Diagnosis not present

## 2015-07-20 DIAGNOSIS — I10 Essential (primary) hypertension: Secondary | ICD-10-CM | POA: Diagnosis not present

## 2015-07-20 DIAGNOSIS — E1142 Type 2 diabetes mellitus with diabetic polyneuropathy: Secondary | ICD-10-CM | POA: Diagnosis not present

## 2015-07-23 DIAGNOSIS — I10 Essential (primary) hypertension: Secondary | ICD-10-CM | POA: Diagnosis not present

## 2015-07-23 DIAGNOSIS — D441 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: Secondary | ICD-10-CM | POA: Diagnosis not present

## 2015-07-23 DIAGNOSIS — E782 Mixed hyperlipidemia: Secondary | ICD-10-CM | POA: Diagnosis not present

## 2015-07-23 DIAGNOSIS — Z0001 Encounter for general adult medical examination with abnormal findings: Secondary | ICD-10-CM | POA: Diagnosis not present

## 2015-07-23 DIAGNOSIS — E1142 Type 2 diabetes mellitus with diabetic polyneuropathy: Secondary | ICD-10-CM | POA: Diagnosis not present

## 2015-07-24 DIAGNOSIS — J45909 Unspecified asthma, uncomplicated: Secondary | ICD-10-CM | POA: Diagnosis not present

## 2015-07-24 DIAGNOSIS — J449 Chronic obstructive pulmonary disease, unspecified: Secondary | ICD-10-CM | POA: Diagnosis not present

## 2015-07-24 DIAGNOSIS — E1142 Type 2 diabetes mellitus with diabetic polyneuropathy: Secondary | ICD-10-CM | POA: Diagnosis not present

## 2015-07-24 DIAGNOSIS — E1165 Type 2 diabetes mellitus with hyperglycemia: Secondary | ICD-10-CM | POA: Diagnosis not present

## 2015-07-24 DIAGNOSIS — I2510 Atherosclerotic heart disease of native coronary artery without angina pectoris: Secondary | ICD-10-CM | POA: Diagnosis not present

## 2015-07-24 DIAGNOSIS — I10 Essential (primary) hypertension: Secondary | ICD-10-CM | POA: Diagnosis not present

## 2015-08-04 DIAGNOSIS — J449 Chronic obstructive pulmonary disease, unspecified: Secondary | ICD-10-CM | POA: Diagnosis not present

## 2015-08-04 DIAGNOSIS — I2510 Atherosclerotic heart disease of native coronary artery without angina pectoris: Secondary | ICD-10-CM | POA: Diagnosis not present

## 2015-08-04 DIAGNOSIS — I10 Essential (primary) hypertension: Secondary | ICD-10-CM | POA: Diagnosis not present

## 2015-08-04 DIAGNOSIS — E1165 Type 2 diabetes mellitus with hyperglycemia: Secondary | ICD-10-CM | POA: Diagnosis not present

## 2015-08-04 DIAGNOSIS — E1142 Type 2 diabetes mellitus with diabetic polyneuropathy: Secondary | ICD-10-CM | POA: Diagnosis not present

## 2015-08-14 DIAGNOSIS — G4730 Sleep apnea, unspecified: Secondary | ICD-10-CM | POA: Diagnosis not present

## 2015-08-24 DIAGNOSIS — I10 Essential (primary) hypertension: Secondary | ICD-10-CM | POA: Diagnosis not present

## 2015-08-24 DIAGNOSIS — E1142 Type 2 diabetes mellitus with diabetic polyneuropathy: Secondary | ICD-10-CM | POA: Diagnosis not present

## 2015-09-14 DIAGNOSIS — G4730 Sleep apnea, unspecified: Secondary | ICD-10-CM | POA: Diagnosis not present

## 2015-10-14 DIAGNOSIS — G4730 Sleep apnea, unspecified: Secondary | ICD-10-CM | POA: Diagnosis not present

## 2015-11-14 DIAGNOSIS — G4730 Sleep apnea, unspecified: Secondary | ICD-10-CM | POA: Diagnosis not present

## 2015-11-17 IMAGING — CR DG KNEE COMPLETE 4+V*L*
1 series · 4 of 4 positions shown · non-contrast
Comparison: None.

CLINICAL DATA: No injury. Left knee pain for 3 days. Pain is
anterior.

EXAM:
LEFT KNEE - COMPLETE 4+ VIEW

[Series 1: dxr knee lt comp with obliques · 0.14mm/px · 4 of 4 slices shown]
[im 1/4]
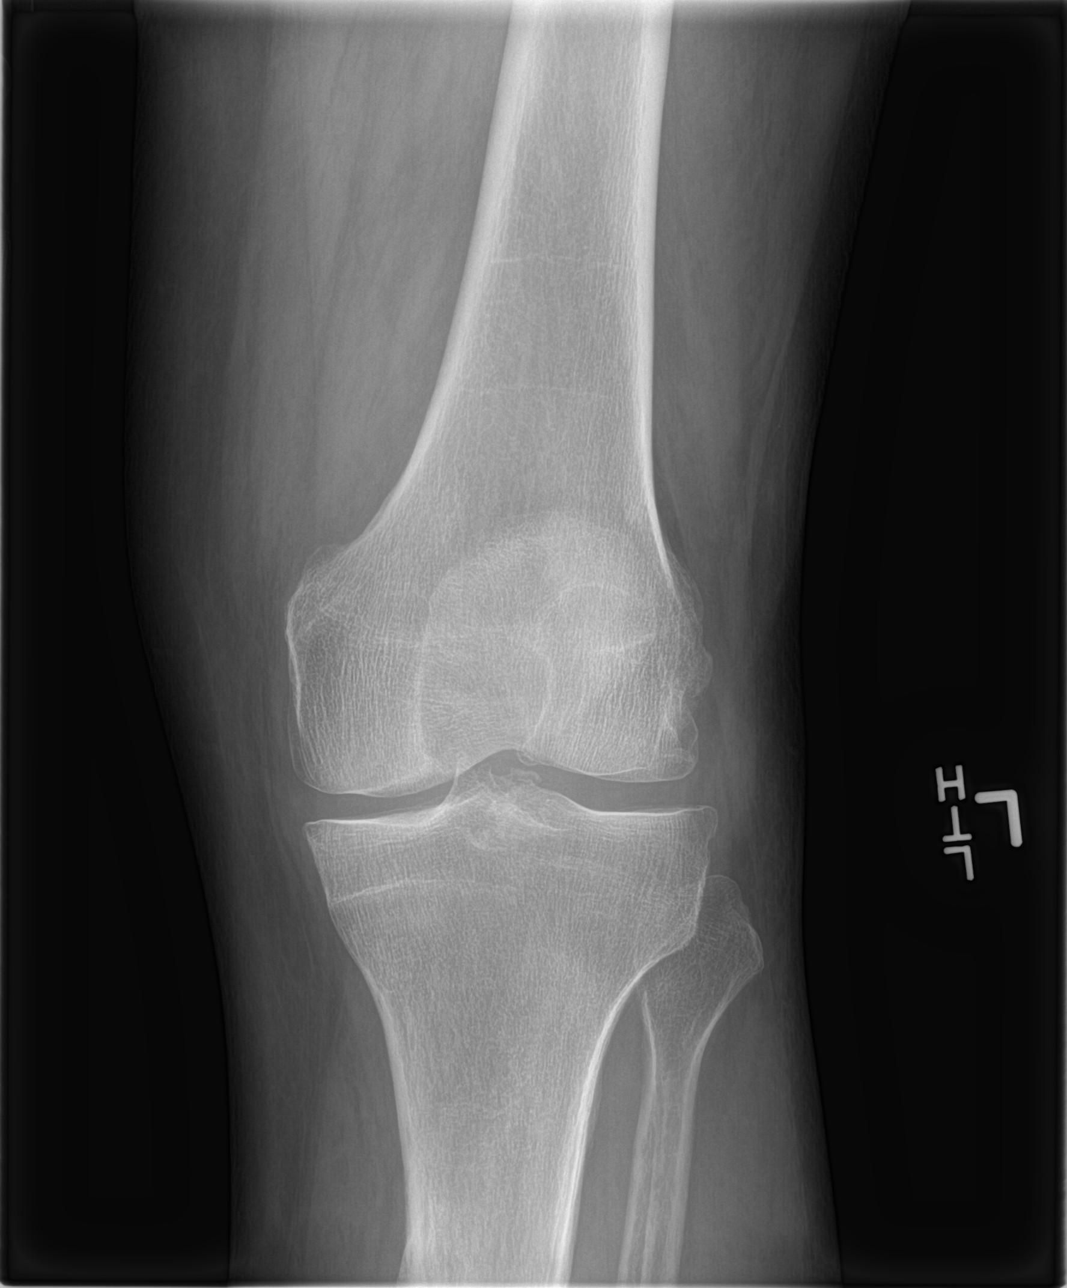
[im 2/4]
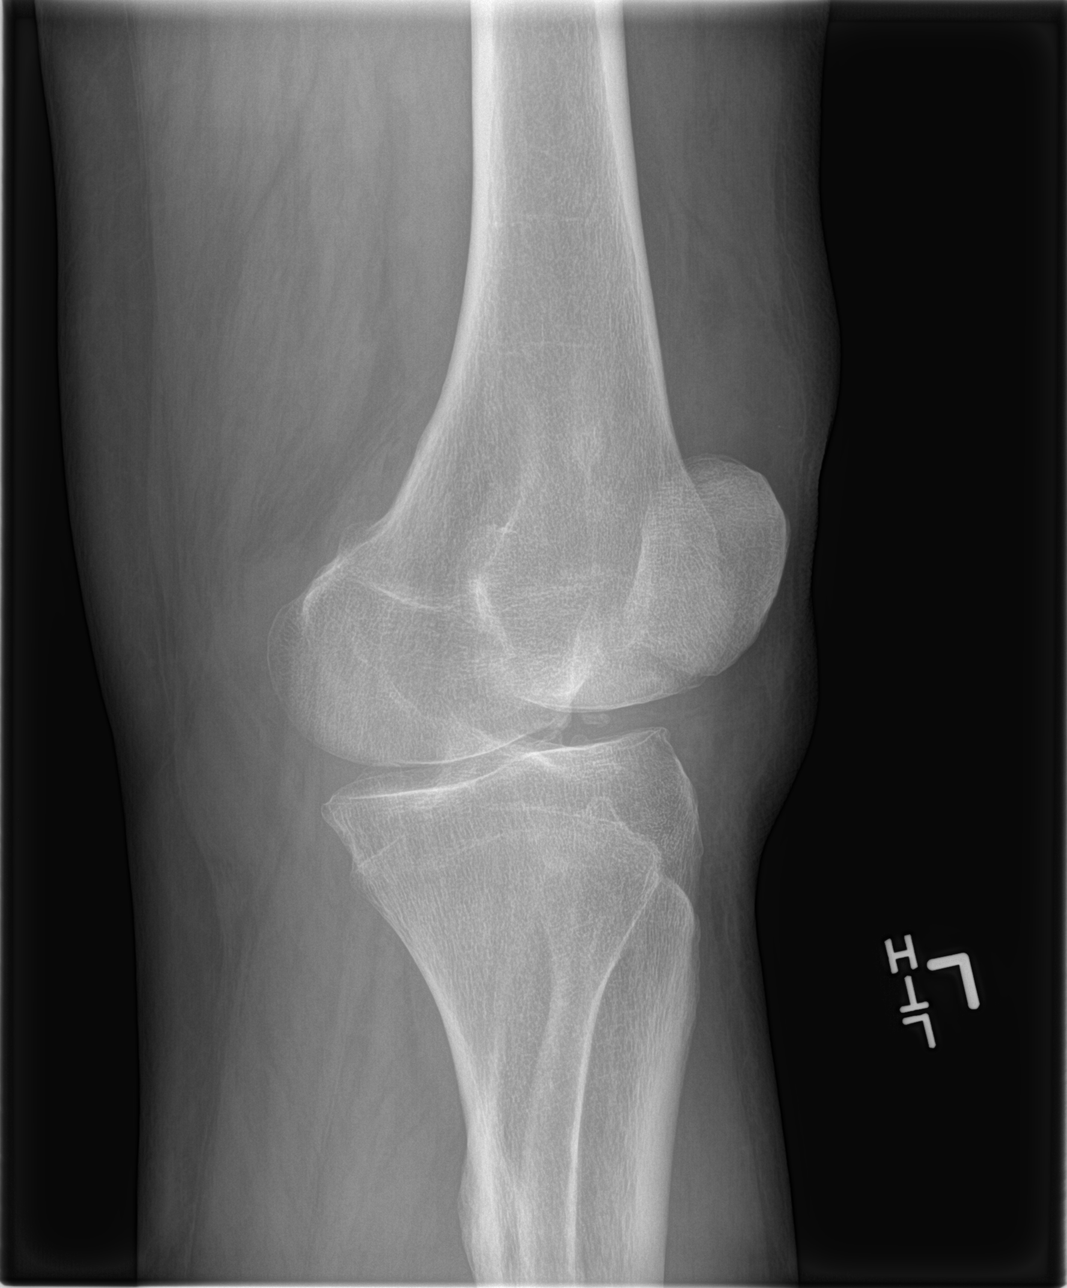
[im 3/4]
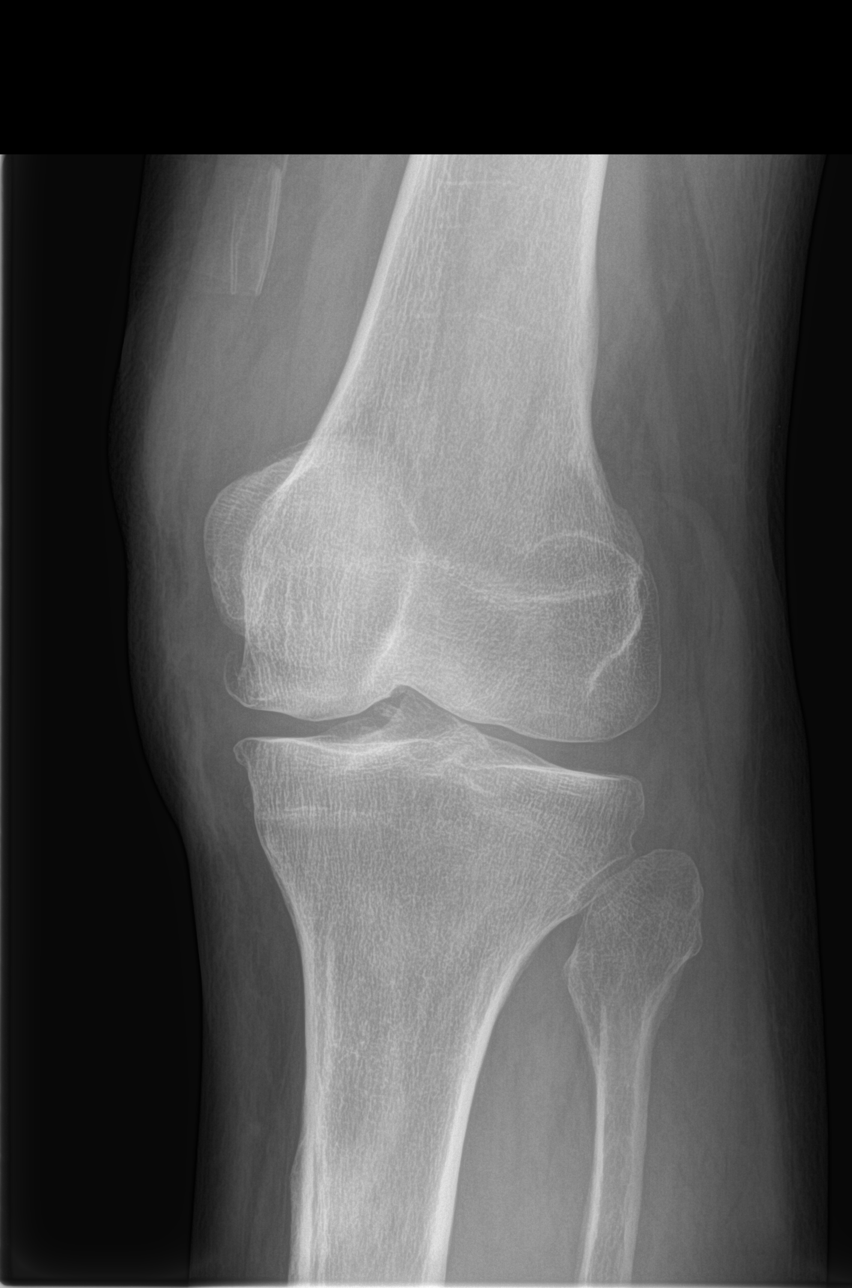
[im 4/4]
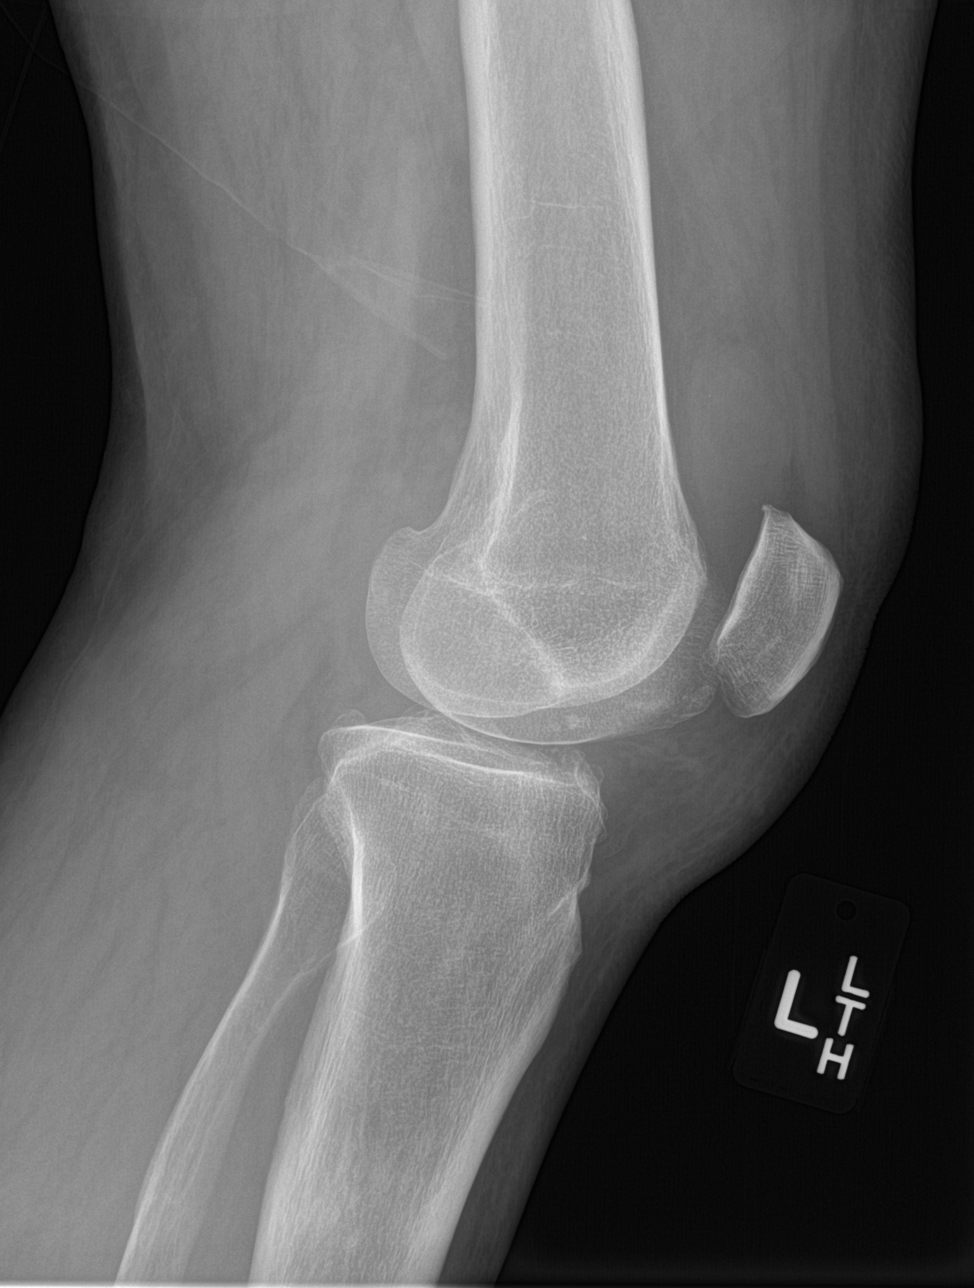

[4 of 4 positions shown; findings below may reference images not displayed]

FINDINGS: No fracture.  No bone lesion.

Knee joint is normally space and aligned. There are small marginal
osteophytes from the medial compartment and patella.

An ossified density projects within the central aspect of the knee,
in the intercondylar notch, anterior to the tibial spine. This may
be intra-articular.

There is a moderate joint effusion.

Mild nonspecific soft tissue edema is seen.
IMPRESSION: 1. No fracture dislocation.
2. Mild degenerative changes reflected by an medial compartment and
patellofemoral joint space compartment marginal spurring.
3. Possible small intra-articular body near the anterior central
aspect of the knee.
4. Moderate joint effusion.

## 2015-12-14 DIAGNOSIS — G4730 Sleep apnea, unspecified: Secondary | ICD-10-CM | POA: Diagnosis not present

## 2016-01-14 DIAGNOSIS — G4730 Sleep apnea, unspecified: Secondary | ICD-10-CM | POA: Diagnosis not present

## 2016-01-18 DIAGNOSIS — L309 Dermatitis, unspecified: Secondary | ICD-10-CM | POA: Diagnosis not present

## 2016-01-18 DIAGNOSIS — G4733 Obstructive sleep apnea (adult) (pediatric): Secondary | ICD-10-CM | POA: Diagnosis not present

## 2016-01-18 DIAGNOSIS — F17210 Nicotine dependence, cigarettes, uncomplicated: Secondary | ICD-10-CM | POA: Diagnosis not present

## 2016-01-18 DIAGNOSIS — J449 Chronic obstructive pulmonary disease, unspecified: Secondary | ICD-10-CM | POA: Diagnosis not present

## 2016-01-22 DIAGNOSIS — G4733 Obstructive sleep apnea (adult) (pediatric): Secondary | ICD-10-CM | POA: Diagnosis not present

## 2016-02-14 DIAGNOSIS — G4730 Sleep apnea, unspecified: Secondary | ICD-10-CM | POA: Diagnosis not present

## 2016-03-15 DIAGNOSIS — G4730 Sleep apnea, unspecified: Secondary | ICD-10-CM | POA: Diagnosis not present

## 2016-04-15 DIAGNOSIS — G4730 Sleep apnea, unspecified: Secondary | ICD-10-CM | POA: Diagnosis not present

## 2016-05-15 DIAGNOSIS — G4730 Sleep apnea, unspecified: Secondary | ICD-10-CM | POA: Diagnosis not present

## 2016-05-23 DIAGNOSIS — E119 Type 2 diabetes mellitus without complications: Secondary | ICD-10-CM | POA: Diagnosis not present

## 2016-05-23 DIAGNOSIS — M159 Polyosteoarthritis, unspecified: Secondary | ICD-10-CM | POA: Diagnosis not present

## 2016-05-23 DIAGNOSIS — E785 Hyperlipidemia, unspecified: Secondary | ICD-10-CM | POA: Diagnosis not present

## 2016-05-23 DIAGNOSIS — G4733 Obstructive sleep apnea (adult) (pediatric): Secondary | ICD-10-CM | POA: Diagnosis not present

## 2016-05-23 DIAGNOSIS — K219 Gastro-esophageal reflux disease without esophagitis: Secondary | ICD-10-CM | POA: Diagnosis not present

## 2016-05-23 DIAGNOSIS — I10 Essential (primary) hypertension: Secondary | ICD-10-CM | POA: Diagnosis not present

## 2016-05-23 DIAGNOSIS — F17200 Nicotine dependence, unspecified, uncomplicated: Secondary | ICD-10-CM | POA: Diagnosis not present

## 2016-05-23 DIAGNOSIS — Z6841 Body Mass Index (BMI) 40.0 and over, adult: Secondary | ICD-10-CM | POA: Diagnosis not present

## 2016-06-15 DIAGNOSIS — G4730 Sleep apnea, unspecified: Secondary | ICD-10-CM | POA: Diagnosis not present

## 2016-07-16 DIAGNOSIS — G4730 Sleep apnea, unspecified: Secondary | ICD-10-CM | POA: Diagnosis not present

## 2016-07-25 DIAGNOSIS — F17210 Nicotine dependence, cigarettes, uncomplicated: Secondary | ICD-10-CM | POA: Diagnosis not present

## 2016-07-25 DIAGNOSIS — G4733 Obstructive sleep apnea (adult) (pediatric): Secondary | ICD-10-CM | POA: Diagnosis not present

## 2016-07-25 DIAGNOSIS — L309 Dermatitis, unspecified: Secondary | ICD-10-CM | POA: Diagnosis not present

## 2016-08-13 DIAGNOSIS — G4730 Sleep apnea, unspecified: Secondary | ICD-10-CM | POA: Diagnosis not present

## 2016-08-16 DIAGNOSIS — G4733 Obstructive sleep apnea (adult) (pediatric): Secondary | ICD-10-CM | POA: Diagnosis not present

## 2016-08-18 DIAGNOSIS — Z125 Encounter for screening for malignant neoplasm of prostate: Secondary | ICD-10-CM | POA: Diagnosis not present

## 2016-08-18 DIAGNOSIS — I10 Essential (primary) hypertension: Secondary | ICD-10-CM | POA: Diagnosis not present

## 2016-08-18 DIAGNOSIS — F17210 Nicotine dependence, cigarettes, uncomplicated: Secondary | ICD-10-CM | POA: Diagnosis not present

## 2016-08-18 DIAGNOSIS — Z0001 Encounter for general adult medical examination with abnormal findings: Secondary | ICD-10-CM | POA: Diagnosis not present

## 2016-08-18 DIAGNOSIS — E1165 Type 2 diabetes mellitus with hyperglycemia: Secondary | ICD-10-CM | POA: Diagnosis not present

## 2016-09-13 DIAGNOSIS — G4730 Sleep apnea, unspecified: Secondary | ICD-10-CM | POA: Diagnosis not present

## 2016-10-13 DIAGNOSIS — G4730 Sleep apnea, unspecified: Secondary | ICD-10-CM | POA: Diagnosis not present

## 2016-11-13 DIAGNOSIS — G4730 Sleep apnea, unspecified: Secondary | ICD-10-CM | POA: Diagnosis not present

## 2016-12-13 DIAGNOSIS — G4730 Sleep apnea, unspecified: Secondary | ICD-10-CM | POA: Diagnosis not present

## 2016-12-30 DIAGNOSIS — I10 Essential (primary) hypertension: Secondary | ICD-10-CM | POA: Diagnosis not present

## 2016-12-30 DIAGNOSIS — E1165 Type 2 diabetes mellitus with hyperglycemia: Secondary | ICD-10-CM | POA: Diagnosis not present

## 2016-12-30 DIAGNOSIS — L309 Dermatitis, unspecified: Secondary | ICD-10-CM | POA: Diagnosis not present

## 2016-12-30 DIAGNOSIS — E782 Mixed hyperlipidemia: Secondary | ICD-10-CM | POA: Diagnosis not present

## 2017-01-13 DIAGNOSIS — G4730 Sleep apnea, unspecified: Secondary | ICD-10-CM | POA: Diagnosis not present

## 2017-02-13 DIAGNOSIS — G4730 Sleep apnea, unspecified: Secondary | ICD-10-CM | POA: Diagnosis not present

## 2017-03-15 DIAGNOSIS — G4730 Sleep apnea, unspecified: Secondary | ICD-10-CM | POA: Diagnosis not present

## 2017-04-15 DIAGNOSIS — G4730 Sleep apnea, unspecified: Secondary | ICD-10-CM | POA: Diagnosis not present

## 2017-05-15 DIAGNOSIS — G4730 Sleep apnea, unspecified: Secondary | ICD-10-CM | POA: Diagnosis not present

## 2017-06-09 ENCOUNTER — Ambulatory Visit: Payer: Self-pay | Admitting: Nurse Practitioner

## 2017-06-09 DIAGNOSIS — J301 Allergic rhinitis due to pollen: Secondary | ICD-10-CM | POA: Insufficient documentation

## 2017-06-09 DIAGNOSIS — D441 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: Secondary | ICD-10-CM | POA: Insufficient documentation

## 2017-06-09 DIAGNOSIS — L732 Hidradenitis suppurativa: Secondary | ICD-10-CM | POA: Insufficient documentation

## 2017-06-09 DIAGNOSIS — R300 Dysuria: Secondary | ICD-10-CM | POA: Insufficient documentation

## 2017-06-09 DIAGNOSIS — B354 Tinea corporis: Secondary | ICD-10-CM | POA: Insufficient documentation

## 2017-06-09 DIAGNOSIS — H6123 Impacted cerumen, bilateral: Secondary | ICD-10-CM | POA: Insufficient documentation

## 2017-06-09 DIAGNOSIS — E782 Mixed hyperlipidemia: Secondary | ICD-10-CM | POA: Insufficient documentation

## 2017-06-09 DIAGNOSIS — F17219 Nicotine dependence, cigarettes, with unspecified nicotine-induced disorders: Secondary | ICD-10-CM | POA: Insufficient documentation

## 2017-06-09 DIAGNOSIS — G4733 Obstructive sleep apnea (adult) (pediatric): Secondary | ICD-10-CM | POA: Insufficient documentation

## 2017-06-09 DIAGNOSIS — I10 Essential (primary) hypertension: Secondary | ICD-10-CM | POA: Insufficient documentation

## 2017-06-09 DIAGNOSIS — Z85528 Personal history of other malignant neoplasm of kidney: Secondary | ICD-10-CM | POA: Insufficient documentation

## 2017-06-09 DIAGNOSIS — L309 Dermatitis, unspecified: Secondary | ICD-10-CM | POA: Insufficient documentation

## 2017-06-09 DIAGNOSIS — R0683 Snoring: Secondary | ICD-10-CM | POA: Insufficient documentation

## 2017-06-09 DIAGNOSIS — R0609 Other forms of dyspnea: Secondary | ICD-10-CM | POA: Insufficient documentation

## 2017-06-09 DIAGNOSIS — R0602 Shortness of breath: Secondary | ICD-10-CM

## 2017-06-09 DIAGNOSIS — E211 Secondary hyperparathyroidism, not elsewhere classified: Secondary | ICD-10-CM | POA: Insufficient documentation

## 2017-06-09 DIAGNOSIS — I2510 Atherosclerotic heart disease of native coronary artery without angina pectoris: Secondary | ICD-10-CM | POA: Insufficient documentation

## 2017-06-09 DIAGNOSIS — R062 Wheezing: Secondary | ICD-10-CM | POA: Insufficient documentation

## 2017-06-09 DIAGNOSIS — E1142 Type 2 diabetes mellitus with diabetic polyneuropathy: Secondary | ICD-10-CM | POA: Insufficient documentation

## 2017-06-09 DIAGNOSIS — J449 Chronic obstructive pulmonary disease, unspecified: Secondary | ICD-10-CM | POA: Insufficient documentation

## 2017-06-09 DIAGNOSIS — M25551 Pain in right hip: Secondary | ICD-10-CM | POA: Insufficient documentation

## 2017-06-09 DIAGNOSIS — E1165 Type 2 diabetes mellitus with hyperglycemia: Secondary | ICD-10-CM | POA: Insufficient documentation

## 2017-06-09 HISTORY — DX: Morbid (severe) obesity due to excess calories: E66.01

## 2017-06-15 DIAGNOSIS — G4730 Sleep apnea, unspecified: Secondary | ICD-10-CM | POA: Diagnosis not present

## 2017-07-16 DIAGNOSIS — G4730 Sleep apnea, unspecified: Secondary | ICD-10-CM | POA: Diagnosis not present

## 2017-07-20 ENCOUNTER — Ambulatory Visit: Payer: Medicare PPO | Admitting: Nurse Practitioner

## 2017-07-20 ENCOUNTER — Encounter: Payer: Self-pay | Admitting: Nurse Practitioner

## 2017-07-20 VITALS — BP 140/80 | HR 75 | Resp 16 | Ht 71.0 in | Wt 263.0 lb

## 2017-07-20 DIAGNOSIS — E782 Mixed hyperlipidemia: Secondary | ICD-10-CM | POA: Diagnosis not present

## 2017-07-20 DIAGNOSIS — I10 Essential (primary) hypertension: Secondary | ICD-10-CM | POA: Diagnosis not present

## 2017-07-20 DIAGNOSIS — E1165 Type 2 diabetes mellitus with hyperglycemia: Secondary | ICD-10-CM | POA: Diagnosis not present

## 2017-07-20 LAB — POCT GLYCOSYLATED HEMOGLOBIN (HGB A1C): HEMOGLOBIN A1C: 7

## 2017-07-20 MED ORDER — NOVOFINE 32G X 6 MM MISC: 4 refills | Status: DC

## 2017-07-20 NOTE — Progress Notes (Signed)
[facility] [place], [place] [address]  Internal MEDICINE  Office Visit Note  Patient Name: Larry Callahan  [id number]  [id number]  Date of Service: 07/30/2017  Chief Complaint  Patient presents with  . Diabetes    Diabetes  He presents for his follow-up diabetic visit. He has type 2 diabetes mellitus. No MedicAlert identification noted. His disease course has been stable. There are no hypoglycemic associated symptoms. Pertinent negatives for hypoglycemia include no headaches, nervousness/anxiousness or tremors. Pertinent negatives for diabetes include no chest pain and no fatigue. There are no hypoglycemic complications. Symptoms are stable. Risk factors for coronary artery disease include dyslipidemia, diabetes mellitus, male sex and hypertension.    Pt is here for routine follow up.    Current Medication: Outpatient Encounter Medications as of 07/20/2017  Medication Sig  . aspirin EC 81 MG tablet Take 81 mg by mouth daily.  Marland Kitchen ACCU-CHEK SMARTVIEW test strip   . amLODipine-benazepril (LOTREL) 5-20 MG capsule   . atorvastatin (LIPITOR) 10 MG tablet   . gemfibrozil (LOPID) 600 MG tablet   . glyBURIDE-metformin (GLUCOVANCE) 5-500 MG tablet   . hydrochlorothiazide (HYDRODIURIL) 25 MG tablet   . meloxicam (MOBIC) 7.5 MG tablet   . metoprolol succinate (TOPROL-XL) 50 MG 24 hr tablet   . NOVOFINE 32G X 6 MM MISC To use with [place] injections daily  . omeprazole (PRILOSEC) 40 MG capsule   . TOUJEO SOLOSTAR 300 UNIT/ML SOPN   . [DISCONTINUED] NOVOFINE 32G X 6 MM MISC    No facility-administered encounter medications on file as of 07/20/2017.     Surgical History: Past Surgical History:  Procedure Laterality Date  . CHOLECYSTECTOMY    . left arm surgery    . nephrectomy Left   . PARATHYROIDECTOMY      Medical History: Past Medical History:  Diagnosis Date  . Allergy   . Coronary artery disease   . Diabetes mellitus without complication ([place])    . GERD (gastroesophageal reflux disease)   . Hyperlipidemia   . Hypertension   . Sleep apnea     Family History: Family History  Problem Relation Age of Onset  . Diabetes Mother   . Lung cancer Father   . Diabetes Sister   . Hypertension Sister   . Heart disease Sister   . Cancer Sister   . Bone cancer Brother     Social History   Socioeconomic History  . Marital status: Single    Spouse name: Not on file  . Number of children: Not on file  . Years of education: Not on file  . Highest education level: Not on file  Social Needs  . Financial resource strain: Not on file  . Food insecurity - worry: Not on file  . Food insecurity - inability: Not on file  . Transportation needs - medical: Not on file  . Transportation needs - non-medical: Not on file  Occupational History  . Not on file  Tobacco Use  . Smoking status: Current Every Day Smoker    Types: Cigarettes  . Smokeless tobacco: Never Used  Substance and Sexual Activity  . Alcohol use: No    Frequency: Never  . Drug use: No  . Sexual activity: Not on file  Other Topics Concern  . Not on file  Social History Narrative  . Not on file      Review of Systems  Constitutional: Negative for chills, fatigue and unexpected weight change.  HENT: Negative for congestion,  postnasal drip, rhinorrhea, sneezing and sore throat.   Eyes: Negative for redness.  Respiratory: Negative for cough, chest tightness and shortness of breath.   Cardiovascular: Negative for chest pain and palpitations.  Gastrointestinal: Negative for abdominal pain, constipation, diarrhea, nausea and vomiting.  Endocrine:       Blood sugars doing well   Genitourinary: Negative.  Negative for dysuria and frequency.  Musculoskeletal: Negative for arthralgias, back pain, joint swelling and neck pain.  Skin: Negative for rash.  Allergic/Immunologic: Negative for environmental allergies, food allergies and immunocompromised state.  Neurological:  Negative for tremors, numbness and headaches.  Hematological: Negative for adenopathy. Does not bruise/bleed easily.  Psychiatric/Behavioral: Negative for behavioral problems (Depression), sleep disturbance and suicidal ideas. The patient is not nervous/anxious.     Today's Vitals   07/20/17 1140  BP: 140/80  Pulse: 75  Resp: 16  SpO2: 97%  Weight: 263 lb (119.3 kg)  Height: 5\' 11"  (1.803 m)     Physical Exam  Constitutional: He is oriented to person, place, and time. He appears well-developed and well-nourished. No distress.  HENT:  Head: Normocephalic and atraumatic.  Mouth/Throat: Oropharynx is clear and moist. No oropharyngeal exudate.  Eyes: EOM are normal. Pupils are equal, round, and reactive to light.  Neck: Normal range of motion. Neck supple. No JVD present. Carotid bruit is not present. No tracheal deviation present. No thyromegaly present.  Cardiovascular: Normal rate, regular rhythm and normal heart sounds. Exam reveals no gallop and no friction rub.  No murmur heard. Pulmonary/Chest: Effort normal and breath sounds normal. No respiratory distress. He has no wheezes. He has no rales. He exhibits no tenderness.  Abdominal: Soft. Bowel sounds are normal. There is no tenderness.  Musculoskeletal: Normal range of motion.  Lymphadenopathy:    He has no cervical adenopathy.  Neurological: He is alert and oriented to person, place, and time. No cranial nerve deficit.  Skin: Skin is warm and dry. He is not diaphoretic.  Psychiatric: He has a normal mood and affect. His behavior is normal. Judgment and thought content normal.  Nursing note and vitals reviewed.   Assessment/Plan: 1. Uncontrolled type 2 diabetes mellitus with hyperglycemia (HCC) - POCT HgB A1C7.0 today. Continue diabetic medication as prescribed.  - NOVOFINE 32G X 6 MM MISC; To use with [place] injections daily  Dispense: 100 each; Refill: 4  2. Essential hypertension Generally stable. Continue bp medication as  prescribed   3. Mixed hyperlipidemia Continue gemfibrozil as prescribed.   General Counseling: coltin casher understanding of the findings of todays visit and agrees with plan of treatment. I have discussed any further diagnostic evaluation that may be needed or ordered today. We also reviewed his medications today. he has been encouraged to call the office with any questions or concerns that should arise related to todays visit.  Diabetes Counseling:  1. Addition of ACE inh/ ARB'S for nephroprotection. 2. Diabetic foot care, prevention of complications.  3.Exercise and lose weight.  4. Diabetic eye examination, 5. Monitor blood sugar closlely. nutrition counseling.  6.Sign and symptoms of hypoglycemia including shaking sweating,confusion and headaches.  This patient was seen by Leretha Pol, FNP- C in Collaboration with Dr Lavera Guise as a part of collaborative care agreement   Orders Placed This Encounter  Procedures  . POCT HgB A1C    Meds ordered this encounter  Medications  . NOVOFINE 32G X 6 MM MISC    Sig: To use with [place] injections daily    Dispense:  100  each    Refill:  4    Order Specific Question:   Supervising Provider    Answer:   Lavera Guise [[id number]]    Time spent: 40 Minutes    Dr Lavera Guise Internal medicine

## 2017-07-30 ENCOUNTER — Encounter: Payer: Self-pay | Admitting: Nurse Practitioner

## 2017-08-13 DIAGNOSIS — G4730 Sleep apnea, unspecified: Secondary | ICD-10-CM | POA: Diagnosis not present

## 2017-08-24 ENCOUNTER — Encounter: Payer: Self-pay | Admitting: Internal Medicine

## 2017-08-24 ENCOUNTER — Ambulatory Visit: Payer: Medicare PPO | Admitting: Internal Medicine

## 2017-08-24 VITALS — BP 164/80 | HR 68 | Resp 16 | Ht 71.0 in | Wt 264.8 lb

## 2017-08-24 DIAGNOSIS — Z9989 Dependence on other enabling machines and devices: Secondary | ICD-10-CM

## 2017-08-24 DIAGNOSIS — J449 Chronic obstructive pulmonary disease, unspecified: Secondary | ICD-10-CM

## 2017-08-24 DIAGNOSIS — R0602 Shortness of breath: Secondary | ICD-10-CM

## 2017-08-24 DIAGNOSIS — G4733 Obstructive sleep apnea (adult) (pediatric): Secondary | ICD-10-CM

## 2017-08-24 NOTE — Progress Notes (Signed)
[facility] [place], [place] [address]  Pulmonary Sleep Medicine  Office Visit Note  Patient Name: Larry Callahan DOB: 01/27/1956 MRN [id number]  Date of Service: 08/24/2017  Complaints/HPI: Patient is here for follow-up of sleep apnea and COPD.  States he is trying to use his machine as prescribed.  He has not been percent compliant.  He says he has been having some issues with his mask and needs a mask however does not have  To pay for any mask. Says that he still gets short of breath when he exerts himself.  He is currently not using his inhalers.  No admissions.  Denies having any cough or congestion at time.  ROS  General: (-) fever, (-) chills, (-) night sweats, (-) weakness Skin: (-) rashes, (-) itching,. Eyes: (-) visual changes, (-) redness, (-) itching. Nose and Sinuses: (-) nasal stuffiness or itchiness, (-) postnasal drip, (-) nosebleeds, (-) sinus trouble. Mouth and Throat: (-) sore throat, (-) hoarseness. Neck: (-) swollen glands, (-) enlarged thyroid, (-) neck pain. Respiratory: - cough, (-) bloody sputum, + shortness of breath, - wheezing. Cardiovascular: - ankle swelling, (-) chest pain. Lymphatic: (-) lymph node enlargement. Neurologic: (-) numbness, (-) tingling. Psychiatric: (-) anxiety, (-) depression   Current Medication: Outpatient Encounter Medications as of 08/24/2017  Medication Sig  . ACCU-CHEK SMARTVIEW test strip   . amLODipine-benazepril (LOTREL) 5-20 MG capsule   . aspirin EC 81 MG tablet Take 81 mg by mouth daily.  Marland Kitchen atorvastatin (LIPITOR) 10 MG tablet   . gemfibrozil (LOPID) 600 MG tablet   . glyBURIDE-metformin (GLUCOVANCE) 5-500 MG tablet   . hydrochlorothiazide (HYDRODIURIL) 25 MG tablet   . meloxicam (MOBIC) 7.5 MG tablet   . metoprolol succinate (TOPROL-XL) 50 MG 24 hr tablet   . NOVOFINE 32G X 6 MM MISC To use with [place] injections daily  . omeprazole (PRILOSEC) 40 MG capsule   . TOUJEO SOLOSTAR 300  UNIT/ML SOPN    No facility-administered encounter medications on file as of 08/24/2017.     Surgical History: Past Surgical History:  Procedure Laterality Date  . CHOLECYSTECTOMY    . left arm surgery    . nephrectomy Left   . PARATHYROIDECTOMY      Medical History: Past Medical History:  Diagnosis Date  . Allergy   . Coronary artery disease   . Diabetes mellitus without complication ([place])   . GERD (gastroesophageal reflux disease)   . Hyperlipidemia   . Hypertension   . Sleep apnea     Family History: Family History  Problem Relation Age of Onset  . Diabetes Mother   . Lung cancer Father   . Diabetes Sister   . Hypertension Sister   . Heart disease Sister   . Cancer Sister   . Bone cancer Brother     Social History: Social History   Socioeconomic History  . Marital status: Single    Spouse name: Not on file  . Number of children: Not on file  . Years of education: Not on file  . Highest education level: Not on file  Social Needs  . Financial resource strain: Not on file  . Food insecurity - worry: Not on file  . Food insecurity - inability: Not on file  . Transportation needs - medical: Not on file  . Transportation needs - non-medical: Not on file  Occupational History  . Not on file  Tobacco Use  . Smoking status: Current Every Day Smoker  Types: Cigarettes  . Smokeless tobacco: Never Used  Substance and Sexual Activity  . Alcohol use: No    Frequency: Never  . Drug use: No  . Sexual activity: Not on file  Other Topics Concern  . Not on file  Social History Narrative  . Not on file    Vital Signs: Blood pressure (!) 164/80, pulse 68, resp. rate 16, height 5\' 11"  (1.803 m), weight 264 lb 12.8 oz (120.1 kg), SpO2 95 %.  Examination: General Appearance: The patient is well-developed, well-nourished, and in no distress. Skin: Gross inspection of skin unremarkable. Head: normocephalic, no gross deformities. Eyes: no gross deformities  noted. ENT: ears appear grossly normal no exudates. Neck: Supple. No thyromegaly. No LAD. Respiratory: scattered rhonchi noted. Cardiovascular: Normal S1 and S2 without murmur or rub. Extremities: No cyanosis. pulses are equal. Neurologic: Alert and oriented. No involuntary movements.  LABS: Recent Results (from the past 2160 hour(s))  POCT HgB A1C     Status: None   Collection Time: 07/20/17 12:30 PM  Result Value Ref Range   Hemoglobin A1C 7.0     Radiology: Dg Knee 4 Views W/patella Left  Result Date: 04/06/2014 * PRIOR REPORT IMPORTED FROM AN EXTERNAL SYSTEM * CLINICAL DATA:  No injury. Left knee pain for 3 days. Pain is anterior. EXAM: LEFT KNEE - COMPLETE 4+ VIEW COMPARISON:  None. FINDINGS: No fracture.  No bone lesion. Knee joint is normally space and aligned. There are small marginal osteophytes from the medial compartment and patella. An ossified density projects within the central aspect of the knee, in the intercondylar notch, anterior to the tibial spine. This may be intra-articular. There is a moderate joint effusion. Mild nonspecific soft tissue edema is seen. IMPRESSION: 1. No fracture dislocation. 2. Mild degenerative changes reflected by an medial compartment and patellofemoral joint space compartment marginal spurring. 3. Possible small intra-articular body near the anterior central aspect of the knee. 4. Moderate joint effusion. Electronically Signed   By: Lajean Manes M.D.   On: 04/06/2014 08:37     No results found.  No results found.    Assessment and Plan: Patient Active Problem List   Diagnosis Date Noted  . Dermatitis, unspecified 06/09/2017  . Atherosclerotic heart disease of native coronary artery without angina pectoris 06/09/2017  . 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 Obstructive sleep apnea, adult 06/09/2017  . Morbid (severe) obesity due to excess calories ([place]) 06/09/2017  . Nicotine dependence, cigarettes,  uncomplicated 99/24/2683  . Type 2 diabetes mellitus with diabetic polyneuropathy ([place]) 06/09/2017  . Allergic rhinitis due to pollen 06/09/2017  . Hyperlipidemia 06/09/2017  . Chronic obstructive pulmonary disease, unspecified ([place]) 06/09/2017  . Shortness of breath 06/09/2017  . Wheezing 06/09/2017  . Dysuria 06/09/2017  . Snoring 06/09/2017  . Hypertension 06/09/2017  . Personal history of other malignant neoplasm of kidney 06/09/2017  . Pain in right hip 06/09/2017  . Impacted cerumen, bilateral 06/09/2017  . Secondary hyperparathyroidism, not elsewhere classified ([place]) 06/09/2017  . Type 2 diabetes mellitus with hyperglycemia ([place]) 06/09/2017  . Tinea corporis 06/09/2017    1. OSA  Discuss the importance of compliance with CPAP.  Needs to continue and needs to also try to see about getting a new mask for his machine.  He also needs to have a download done. 2. Obesity  Needs to work on weight loss discuss this at length with the patient 3. COPD currently is stable needs follow-up pulmonary functions.  Discussed  with him smoking cessation again 4. SOB secondary to COPDwe will continue full supportive care 5. Smoker ongoing smoking cessation discussed  General Counseling: I have discussed the findings of the evaluation and examination with Jeneen Rinks.  I have also discussed any further diagnostic evaluation thatmay be needed or ordered today. Yosgart verbalizes understanding of the findings of todays visit. We also reviewed his medications today and discussed drug interactions and side effects including but not limited excessive drowsiness and altered mental states. We also discussed that there is always a risk not just to him but also people around him. he has been encouraged to call the office with any questions or concerns that should arise related to todays visit.    Time spent: 66min  I have personally obtained a history, examined the patient, evaluated laboratory and imaging results,  formulated the assessment and plan and placed orders.    [name], MD [facility] Pulmonary and Critical Care Sleep medicine

## 2017-08-24 NOTE — Patient Instructions (Signed)
Sleep Apnea Sleep apnea is a condition that affects breathing. People with sleep apnea have moments during sleep when their breathing pauses briefly or gets shallow. Sleep apnea can cause these symptoms:  Trouble staying asleep.  Sleepiness or tiredness during the day.  Irritability.  Loud snoring.  Morning headaches.  Trouble concentrating.  Forgetting things.  Less interest in sex.  Being sleepy for no reason.  Mood swings.  Personality changes.  Depression.  Waking up a lot during the night to pee (urinate).  Dry mouth.  Sore throat.  Follow these instructions at home:  Make any changes in your routine that your doctor recommends.  Eat a healthy, well-balanced diet.  Take over-the-counter and prescription medicines only as told by your doctor.  Avoid using alcohol, calming medicines (sedatives), and narcotic medicines.  Take steps to lose weight if you are overweight.  If you were given a machine (device) to use while you sleep, use it only as told by your doctor.  Do not use any tobacco products, such as cigarettes, chewing tobacco, and e-cigarettes. If you need help quitting, ask your doctor.  Keep all follow-up visits as told by your doctor. This is important. Contact a doctor if:  The machine that you were given to use during sleep is uncomfortable or does not seem to be working.  Your symptoms do not get better.  Your symptoms get worse. Get help right away if:  Your chest hurts.  You have trouble breathing in enough air (shortness of breath).  You have an uncomfortable feeling in your back, arms, or stomach.  You have trouble talking.  One side of your body feels weak.  A part of your face is hanging down (drooping). These symptoms may be an emergency. Do not wait to see if the symptoms will go away. Get medical help right away. Call your local emergency services (911 in the [place] Do not drive yourself to the hospital. This information  is not intended to replace advice given to you by your health care provider. Make sure you discuss any questions you have with your health care provider. Document Released: 03/08/2008 Document Revised: 01/24/2016 Document Reviewed: 03/09/2015 Elsevier Interactive Patient Education  2018 [employer]  

## 2017-08-30 ENCOUNTER — Other Ambulatory Visit: Payer: Self-pay | Admitting: Internal Medicine

## 2017-09-13 DIAGNOSIS — G4730 Sleep apnea, unspecified: Secondary | ICD-10-CM | POA: Diagnosis not present

## 2017-09-20 ENCOUNTER — Other Ambulatory Visit: Payer: Self-pay

## 2017-09-20 ENCOUNTER — Ambulatory Visit: Payer: Medicare PPO | Admitting: Internal Medicine

## 2017-09-20 DIAGNOSIS — R0602 Shortness of breath: Secondary | ICD-10-CM

## 2017-09-20 NOTE — Telephone Encounter (Signed)
Faxed to [facility] for  Single use swab qty of 100 #3 refills True metrix level ctrl soln qty of 1 0 rfs True metrix air meter qty of 1 0 refills humana true metrix test strip qty of  100 3 refills  Trueplus 33g lancets qty 100 3 refills

## 2017-09-25 ENCOUNTER — Encounter: Payer: Self-pay | Admitting: Internal Medicine

## 2017-09-25 NOTE — Procedures (Signed)
[place], [address]  DATE OF SERVICE:  September 20, 2017  Complete Pulmonary Function Testing Interpretation:  FINDINGS:   forced vital capacity is normal the FEV1 is normal the FEV!/FVC is mildly decreased.  Post bronchodilator no significant improvement in the FEV1 however clinical improvement may occur in the absence of spirometric improvement.  Total lung capacity is normal residual volume is normal residual volume total lung capacity ratio is normal FRC is normal the DLCO is normal  IMPRESSION:   this pulmonary function is within normal limits.  There is no significant improvement post bronchodilator however clinical improvement may occur in does not preclude the use of bronchodilators Clinical correlation recommended  [name], MD [facility] Pulmonary Critical Care Medicine Sleep Medicine

## 2017-10-13 DIAGNOSIS — G4730 Sleep apnea, unspecified: Secondary | ICD-10-CM | POA: Diagnosis not present

## 2017-10-23 ENCOUNTER — Ambulatory Visit: Payer: Medicare PPO | Admitting: Nurse Practitioner

## 2017-10-23 VITALS — BP 142/88 | HR 57 | Resp 18 | Ht 71.0 in | Wt 268.6 lb

## 2017-10-23 DIAGNOSIS — R300 Dysuria: Secondary | ICD-10-CM

## 2017-10-23 DIAGNOSIS — Z0001 Encounter for general adult medical examination with abnormal findings: Secondary | ICD-10-CM | POA: Diagnosis not present

## 2017-10-23 DIAGNOSIS — Z1211 Encounter for screening for malignant neoplasm of colon: Secondary | ICD-10-CM

## 2017-10-23 DIAGNOSIS — I10 Essential (primary) hypertension: Secondary | ICD-10-CM | POA: Diagnosis not present

## 2017-10-23 DIAGNOSIS — E1165 Type 2 diabetes mellitus with hyperglycemia: Secondary | ICD-10-CM | POA: Diagnosis not present

## 2017-10-23 DIAGNOSIS — Z125 Encounter for screening for malignant neoplasm of prostate: Secondary | ICD-10-CM

## 2017-10-23 DIAGNOSIS — E782 Mixed hyperlipidemia: Secondary | ICD-10-CM

## 2017-10-23 LAB — POCT GLYCOSYLATED HEMOGLOBIN (HGB A1C): HEMOGLOBIN A1C: 6.7

## 2017-10-23 NOTE — Progress Notes (Signed)
[facility] [address] [place], [place] [address]  Internal MEDICINE  Office Visit Note  Patient Name: Larry Callahan  [id number]  [id number]  Date of Service: 11/06/2017   Pt is here for routine health maintenance examination  Chief Complaint  Patient presents with  . Annual Exam  . Hypertension  . Diabetes     Hypertension  This is a chronic problem. The current episode started more than 1 year ago. The problem is unchanged. The problem is controlled. Associated symptoms include headaches. Pertinent negatives include no chest pain, neck pain, palpitations or shortness of breath. There are no associated agents to hypertension. Risk factors for coronary artery disease include diabetes mellitus, dyslipidemia, male gender and obesity. Past treatments include ACE inhibitors, beta blockers and calcium channel blockers. The current treatment provides moderate improvement. Compliance problems include diet and exercise.  Hypertensive end-organ damage includes CAD/MI and PVD.  Diabetes  He presents for his follow-up diabetic visit. He has type 2 diabetes mellitus. No MedicAlert identification noted. His disease course has been stable. Hypoglycemia symptoms include headaches. Pertinent negatives for hypoglycemia include no nervousness/anxiousness or tremors. There are no diabetic associated symptoms. Pertinent negatives for diabetes include no chest pain and no fatigue. There are no hypoglycemic complications. Symptoms are stable. Diabetic complications include PVD. Risk factors for coronary artery disease include diabetes mellitus, dyslipidemia, hypertension and male sex. Current diabetic treatment includes oral agent (dual therapy). He is compliant with treatment most of the time. His weight is stable. He is following a generally healthy diet. He participates in exercise intermittently. There is no change in his home blood glucose trend. An ACE  inhibitor/angiotensin II receptor blocker is not being taken. He does not see a podiatrist.Eye exam is current.     Current Medication: Outpatient Encounter Medications as of 10/23/2017  Medication Sig  . Alcohol Swabs (B-D SINGLE USE SWABS BUTTERFLY) PADS by Does not apply route.  Marland Kitchen amLODipine-benazepril (LOTREL) 5-20 MG capsule TAKE 1 CAPSULE TWICE DAILY  . aspirin EC 81 MG tablet Take 81 mg by mouth daily.  Marland Kitchen atorvastatin (LIPITOR) 10 MG tablet TAKE 1 TABLET EVERY DAY  . Blood Glucose Monitoring Suppl (TRUE METRIX AIR GLUCOSE METER) DEVI by Does not apply route.  Marland Kitchen gemfibrozil (LOPID) 600 MG tablet TAKE 1 TABLET TWICE DAILY WITH MEALS  . glucose blood test strip 1 each by Other route as needed for other (true metrix test strip). Use as instructed  . glyBURIDE-metformin (GLUCOVANCE) 5-500 MG tablet   . hydrochlorothiazide (HYDRODIURIL) 25 MG tablet TAKE 1 TABLET EVERY MORNING FOR BLOOD PRESSURE  . meloxicam (MOBIC) 7.5 MG tablet TAKE 1 TABLET TWICE DAILY AS NEEDED FOR  JOINT  PAIN  . metoprolol succinate (TOPROL-XL) 50 MG 24 hr tablet TAKE 1 TABLET AT BEDTIME  . NOVOFINE 32G X 6 MM MISC To use with [place] injections daily  . omeprazole (PRILOSEC) 40 MG capsule TAKE 1 CAPSULE EVERY DAY  . TOUJEO SOLOSTAR 300 UNIT/ML SOPN   . TRUEPLUS LANCETS 33G MISC by Does not apply route.   No facility-administered encounter medications on file as of 10/23/2017.     Surgical History: Past Surgical History:  Procedure Laterality Date  . CHOLECYSTECTOMY    . left arm surgery    . nephrectomy Left   . PARATHYROIDECTOMY      Medical History: Past Medical History:  Diagnosis Date  . Allergy   . Coronary artery disease   . Diabetes mellitus without complication ([place])   .  GERD (gastroesophageal reflux disease)   . Hyperlipidemia   . Hypertension   . Sleep apnea     Family History: Family History  Problem Relation Age of Onset  . Diabetes Mother   . Lung cancer Father   . Diabetes Sister    . Hypertension Sister   . Heart disease Sister   . Cancer Sister   . Bone cancer Brother       Review of Systems  Constitutional: Negative for activity change, chills, fatigue and unexpected weight change.  HENT: Negative for congestion, postnasal drip, rhinorrhea, sneezing and sore throat.   Eyes: Negative.  Negative for redness.  Respiratory: Negative for cough, chest tightness and shortness of breath.   Cardiovascular: Negative for chest pain and palpitations.  Gastrointestinal: Negative for abdominal pain, constipation, diarrhea, nausea and vomiting.  Endocrine:       Blood sugars doing well   Genitourinary: Negative for dysuria and frequency.  Musculoskeletal: Negative for arthralgias, back pain, joint swelling and neck pain.  Skin: Negative for rash.  Allergic/Immunologic: Negative for environmental allergies, food allergies and immunocompromised state.  Neurological: Positive for headaches. Negative for tremors and numbness.  Hematological: Negative for adenopathy. Does not bruise/bleed easily.  Psychiatric/Behavioral: Negative for behavioral problems (Depression), sleep disturbance and suicidal ideas. The patient is not nervous/anxious.      Vital Signs: BP (!) 142/88   Pulse (!) 57   Resp 18   Ht 5\' 11"  (1.803 m)   Wt 268 lb 9.6 oz (121.8 kg)   SpO2 95%   BMI 37.46 kg/m    Physical Exam  Constitutional: He is oriented to person, place, and time. He appears well-developed and well-nourished. No distress.  HENT:  Head: Normocephalic and atraumatic.  Nose: Nose normal.  Mouth/Throat: Oropharynx is clear and moist. No oropharyngeal exudate.  Eyes: Pupils are equal, round, and reactive to light. Conjunctivae and EOM are normal.  Neck: Normal range of motion. Neck supple. No JVD present. Carotid bruit is not present. No tracheal deviation present. No thyromegaly present.  Cardiovascular: Normal rate, regular rhythm, normal heart sounds and intact distal pulses. Exam  reveals no gallop and no friction rub.  No murmur heard. Pulses:      Dorsalis pedis pulses are 2+ on the right side, and 2+ on the left side.       Posterior tibial pulses are 2+ on the right side, and 2+ on the left side.  Pulmonary/Chest: Effort normal and breath sounds normal. No respiratory distress. He has no wheezes. He has no rales. He exhibits no tenderness.  Abdominal: Soft. Bowel sounds are normal. There is no tenderness.  Musculoskeletal: Normal range of motion.       Right foot: There is normal range of motion and no deformity.       Left foot: There is normal range of motion and no deformity.  Feet:  Right Foot:  Protective Sensation: 6 sites tested. 6 sites sensed.  Skin Integrity: Positive for dry skin. Negative for ulcer, skin breakdown, erythema, warmth or callus.  Left Foot:  Protective Sensation: 6 sites tested.  Skin Integrity: Positive for dry skin. Negative for ulcer, blister, skin breakdown, erythema, warmth or callus.  Lymphadenopathy:    He has no cervical adenopathy.  Neurological: He is alert and oriented to person, place, and time. No cranial nerve deficit.  Patient is at neurological baseline.   Skin: Skin is warm and dry. Capillary refill takes 2 to 3 seconds. He is not diaphoretic.  Psychiatric: He has a normal mood and affect. His behavior is normal. Judgment and thought content normal.  Nursing note and vitals reviewed.    LABS: Recent Results (from the past 2160 hour(s))  POCT HgB A1C     Status: None   Collection Time: 10/23/17 10:57 AM  Result Value Ref Range   Hemoglobin A1C 6.7   Urinalysis, Routine w reflex microscopic     Status: Abnormal   Collection Time: 10/23/17  3:17 PM  Result Value Ref Range   Specific Gravity, UA 1.013 1.005 - 1.030   pH, UA 5.5 5.0 - 7.5   Color, UA Yellow Yellow   Appearance Ur Clear Clear   Leukocytes, UA Negative Negative   Protein, UA 2+ (A) Negative/Trace   Glucose, UA Negative Negative   Ketones, UA  Negative Negative   RBC, UA Negative Negative   Bilirubin, UA Negative Negative   Urobilinogen, Ur 0.2 0.2 - 1.0 mg/dL   Nitrite, UA Negative Negative   Microscopic Examination See below:     Comment: Microscopic was indicated and was performed.  Microscopic Examination     Status: Abnormal   Collection Time: 10/23/17  3:17 PM  Result Value Ref Range   WBC, UA 0-5 0 - 5 /hpf   RBC, UA 0-2 0 - 2 /hpf   Epithelial Cells (non renal) None seen 0 - 10 /hpf   Casts Present (A) None seen /lpf   Cast Type Hyaline casts N/A   Mucus, UA Present Not Estab.   Bacteria, UA Few None seen/Few   Assessment/Plan:  1. Encounter for general adult medical examination with abnormal findings Annual health maintenance exam performed today. Routine, fasting labs ordered.  - CBC with Differential/Platelet - Comprehensive metabolic panel - Lipid panel - TSH  2. Uncontrolled type 2 diabetes mellitus with hyperglycemia (HCC) - POCT HgB A1C 6.7 today. Continue diabetic medication as prescribed. Refer to opthalmology for diabetic eye exam.  - CBC with Differential/Platelet - Comprehensive metabolic panel - Lipid panel - TSH - Ambulatory referral to Ophthalmology  3. Essential hypertension Stable. Continue bp medication as prescribed.   4. Mixed hyperlipidemia Check fasting lipid panel and adjust atorvastatin as indicated.  - Lipid panel  5. Screening for colon cancer - Ambulatory referral to Gastroenterology  6. Screening for prostate cancer - PSA  7. Dysuria - Urinalysis, Routine w reflex microscopic   General Counseling: castiel lauricella understanding of the findings of todays visit and agrees with plan of treatment. I have discussed any further diagnostic evaluation that may be needed or ordered today. We also reviewed his medications today. he has been encouraged to call the office with any questions or concerns that should arise related to todays visit.  Diabetes Counseling:  1.  Addition of ACE inh/ ARB'S for nephroprotection. 2. Diabetic foot care, prevention of complications.  3.Exercise and lose weight.  4. Diabetic eye examination, 5. Monitor blood sugar closlely. nutrition counseling.  6.Sign and symptoms of hypoglycemia including shaking sweating,confusion and headaches.   This patient was seen by Leretha Pol, FNP- C in Collaboration with Dr Lavera Guise as a part of collaborative care agreement    Orders Placed This Encounter  Procedures  . Microscopic Examination  . Urinalysis, Routine w reflex microscopic  . PSA  . CBC with Differential/Platelet  . Comprehensive metabolic panel  . Lipid panel  . TSH  . Ambulatory referral to Gastroenterology  . Ambulatory referral to Ophthalmology  . POCT HgB A1C  Time spent: Petros, MD  Internal Medicine

## 2017-10-24 LAB — URINALYSIS, ROUTINE W REFLEX MICROSCOPIC
BILIRUBIN UA: NEGATIVE
GLUCOSE, UA: NEGATIVE
Ketones, UA: NEGATIVE
Leukocytes, UA: NEGATIVE
Nitrite, UA: NEGATIVE
PH UA: 5.5 (ref 5.0–7.5)
RBC, UA: NEGATIVE
Specific Gravity, UA: 1.013 (ref 1.005–1.030)
Urobilinogen, Ur: 0.2 mg/dL (ref 0.2–1.0)

## 2017-10-24 LAB — MICROSCOPIC EXAMINATION: EPITHELIAL CELLS (NON RENAL): NONE SEEN /HPF (ref 0–10)

## 2017-11-06 DIAGNOSIS — E1165 Type 2 diabetes mellitus with hyperglycemia: Secondary | ICD-10-CM | POA: Insufficient documentation

## 2017-11-06 DIAGNOSIS — Z125 Encounter for screening for malignant neoplasm of prostate: Secondary | ICD-10-CM

## 2017-11-06 DIAGNOSIS — E119 Type 2 diabetes mellitus without complications: Secondary | ICD-10-CM | POA: Insufficient documentation

## 2017-11-06 DIAGNOSIS — Z1211 Encounter for screening for malignant neoplasm of colon: Secondary | ICD-10-CM | POA: Insufficient documentation

## 2017-11-06 DIAGNOSIS — Z4901 Encounter for fitting and adjustment of extracorporeal dialysis catheter: Secondary | ICD-10-CM | POA: Insufficient documentation

## 2017-11-13 DIAGNOSIS — G4730 Sleep apnea, unspecified: Secondary | ICD-10-CM | POA: Diagnosis not present

## 2017-12-13 DIAGNOSIS — G4730 Sleep apnea, unspecified: Secondary | ICD-10-CM | POA: Diagnosis not present

## 2018-01-01 ENCOUNTER — Other Ambulatory Visit: Payer: Self-pay | Admitting: Internal Medicine

## 2018-01-13 DIAGNOSIS — G4730 Sleep apnea, unspecified: Secondary | ICD-10-CM | POA: Diagnosis not present

## 2018-01-29 ENCOUNTER — Ambulatory Visit: Payer: Self-pay | Admitting: Nurse Practitioner

## 2018-02-02 ENCOUNTER — Ambulatory Visit: Payer: Medicare PPO | Admitting: Nurse Practitioner

## 2018-02-02 ENCOUNTER — Encounter: Payer: Self-pay | Admitting: Nurse Practitioner

## 2018-02-02 VITALS — BP 168/86 | HR 63 | Resp 16 | Ht 71.0 in | Wt 269.2 lb

## 2018-02-02 DIAGNOSIS — I10 Essential (primary) hypertension: Secondary | ICD-10-CM

## 2018-02-02 DIAGNOSIS — Z1211 Encounter for screening for malignant neoplasm of colon: Secondary | ICD-10-CM | POA: Diagnosis not present

## 2018-02-02 DIAGNOSIS — E1165 Type 2 diabetes mellitus with hyperglycemia: Secondary | ICD-10-CM

## 2018-02-02 DIAGNOSIS — L209 Atopic dermatitis, unspecified: Secondary | ICD-10-CM

## 2018-02-02 LAB — POCT GLYCOSYLATED HEMOGLOBIN (HGB A1C): Hemoglobin A1C: 6.9 % — AB (ref 4.0–5.6)

## 2018-02-02 MED ORDER — TRIAMCINOLONE ACETONIDE 0.025 % EX CREA: 1.0000 "application " | TOPICAL_CREAM | Freq: Two times a day (BID) | CUTANEOUS | 2 refills | Status: DC

## 2018-02-02 NOTE — Progress Notes (Signed)
[facility] [place], [place] [address]  Internal MEDICINE  Office Visit Note  Patient Name: Larry Callahan  [id number]  [id number]  Date of Service: 02/02/2018  Chief Complaint  Patient presents with  . Diabetes    3 month follow up  . Rash    pt complains of itchy rash on lower back and left arm  . Hypertension    elevated today    The patient is having rash with "whelps" on elbows and forearms. Very itchy. Will get this along his lower waist line as well. Happening when he goes outside and gets very hot. A prescription for triamcinolone cream had been sent in and he says this has helped.  Labs ordered at the last visit still need to be done. He needs to have colonoscopy and diabetic eye exam .  Diabetes  He presents for his follow-up diabetic visit. He has type 2 diabetes mellitus. His disease course has been stable. Hypoglycemia symptoms include headaches. Pertinent negatives for hypoglycemia include no nervousness/anxiousness or tremors. Pertinent negatives for diabetes include no chest pain and no fatigue. There are no hypoglycemic complications. Symptoms are stable. Risk factors for coronary artery disease include dyslipidemia, hypertension, obesity and male sex. Current diabetic treatment includes insulin injections and oral agent (dual therapy). He is compliant with treatment most of the time. His weight is stable. He is following a generally healthy diet. Meal planning includes avoidance of concentrated sweets. He has not had a previous visit with a dietitian. He participates in exercise intermittently. There is no change in his home blood glucose trend. An ACE inhibitor/angiotensin II receptor blocker is being taken. He does not see a podiatrist.Eye exam is current.  Hypertension  This is a chronic problem. The current episode started more than 1 year ago. The problem is resistant. Associated symptoms include headaches. Pertinent negatives include  no chest pain, neck pain, palpitations or shortness of breath. Agents associated with hypertension include NSAIDs. Risk factors for coronary artery disease include obesity, male gender, dyslipidemia and family history. Past treatments include calcium channel blockers and diuretics. The current treatment provides moderate improvement. There are no compliance problems.      Current Medication: Outpatient Encounter Medications as of 02/02/2018  Medication Sig  . Alcohol Swabs (B-D SINGLE USE SWABS BUTTERFLY) PADS by Does not apply route.  Marland Kitchen amLODipine-benazepril (LOTREL) 5-20 MG capsule TAKE 1 CAPSULE TWICE DAILY  . aspirin EC 81 MG tablet Take 81 mg by mouth daily.  Marland Kitchen atorvastatin (LIPITOR) 10 MG tablet TAKE 1 TABLET EVERY DAY  . Blood Glucose Monitoring Suppl (TRUE METRIX AIR GLUCOSE METER) DEVI by Does not apply route.  Marland Kitchen gemfibrozil (LOPID) 600 MG tablet TAKE 1 TABLET TWICE DAILY WITH MEALS  . glucose blood test strip 1 each by Other route as needed for other (true metrix test strip). Use as instructed  . glyBURIDE-metformin (GLUCOVANCE) 5-500 MG tablet 2 with breakfast and 2 with dinner  . hydrochlorothiazide (HYDRODIURIL) 25 MG tablet TAKE 1 TABLET EVERY MORNING FOR BLOOD PRESSURE  . meloxicam (MOBIC) 7.5 MG tablet TAKE 1 TABLET TWICE DAILY AS NEEDED FOR  JOINT  PAIN  . metoprolol succinate (TOPROL-XL) 50 MG 24 hr tablet TAKE 1 TABLET AT BEDTIME  . NOVOFINE 32G X 6 MM MISC To use with [place] injections daily  . omeprazole (PRILOSEC) 40 MG capsule TAKE 1 CAPSULE EVERY DAY  . TOUJEO SOLOSTAR 300 UNIT/ML SOPN   . TRUEPLUS LANCETS 33G MISC by Does not apply  route.  . triamcinolone (KENALOG) 0.025 % cream Apply 1 application topically 2 (two) times daily.   No facility-administered encounter medications on file as of 02/02/2018.     Surgical History: Past Surgical History:  Procedure Laterality Date  . CHOLECYSTECTOMY    . left arm surgery    . nephrectomy Left   . PARATHYROIDECTOMY       Medical History: Past Medical History:  Diagnosis Date  . Allergy   . Coronary artery disease   . Diabetes mellitus without complication ([place])   . GERD (gastroesophageal reflux disease)   . Hyperlipidemia   . Hypertension   . Sleep apnea     Family History: Family History  Problem Relation Age of Onset  . Diabetes Mother   . Lung cancer Father   . Diabetes Sister   . Hypertension Sister   . Heart disease Sister   . Cancer Sister   . Bone cancer Brother     Social History   Socioeconomic History  . Marital status: Single    Spouse name: Not on file  . Number of children: Not on file  . Years of education: Not on file  . Highest education level: Not on file  Occupational History  . Not on file  Social Needs  . Financial resource strain: Not on file  . Food insecurity:    Worry: Not on file    Inability: Not on file  . Transportation needs:    Medical: Not on file    Non-medical: Not on file  Tobacco Use  . Smoking status: Current Every Day Smoker    Types: Cigarettes  . Smokeless tobacco: Never Used  Substance and Sexual Activity  . Alcohol use: No    Frequency: Never  . Drug use: No  . Sexual activity: Not on file  Lifestyle  . Physical activity:    Days per week: Not on file    Minutes per session: Not on file  . Stress: Not on file  Relationships  . Social connections:    Talks on phone: Not on file    Gets together: Not on file    Attends religious service: Not on file    Active member of club or organization: Not on file    Attends meetings of clubs or organizations: Not on file    Relationship status: Not on file  . Intimate partner violence:    Fear of current or ex partner: Not on file    Emotionally abused: Not on file    Physically abused: Not on file    Forced sexual activity: Not on file  Other Topics Concern  . Not on file  Social History Narrative  . Not on file      Review of Systems  Constitutional: Negative for chills,  fatigue and unexpected weight change.  HENT: Negative for congestion, postnasal drip, rhinorrhea, sneezing and sore throat.   Eyes: Negative.  Negative for redness.  Respiratory: Negative for cough, chest tightness and shortness of breath.   Cardiovascular: Negative for chest pain and palpitations.       Elevated blood pressure today  Gastrointestinal: Negative for abdominal pain, constipation, diarrhea, nausea and vomiting.  Endocrine:       Blood sugars doing well   Genitourinary: Negative for dysuria and frequency.  Musculoskeletal: Negative for arthralgias, back pain, joint swelling and neck pain.  Skin: Positive for rash.       Irritated, itchy rash on both slbows and forearms .  Allergic/Immunologic: Negative for environmental allergies, food allergies and immunocompromised state.  Neurological: Positive for headaches. Negative for tremors and numbness.  Hematological: Negative for adenopathy. Does not bruise/bleed easily.  Psychiatric/Behavioral: Negative for behavioral problems (Depression), sleep disturbance and suicidal ideas. The patient is not nervous/anxious.      Today's Vitals   02/02/18 0949  BP: (!) 168/86  Pulse: 63  Resp: 16  SpO2: 96%  Weight: 269 lb 3.2 oz (122.1 kg)  Height: 5\' 11"  (1.803 m)   Physical Exam  Constitutional: He is oriented to person, place, and time. He appears well-developed and well-nourished. No distress.  HENT:  Head: Normocephalic and atraumatic.  Nose: Nose normal.  Mouth/Throat: Oropharynx is clear and moist. No oropharyngeal exudate.  Eyes: Pupils are equal, round, and reactive to light. EOM are normal.  Neck: Normal range of motion. Neck supple. No JVD present. Carotid bruit is not present. No tracheal deviation present. No thyromegaly present.  Cardiovascular: Normal rate, regular rhythm and normal heart sounds. Exam reveals no gallop and no friction rub.  No murmur heard. Pulmonary/Chest: Effort normal and breath sounds normal.  No respiratory distress. He has no wheezes. He has no rales. He exhibits no tenderness.  Abdominal: Soft. Bowel sounds are normal. There is no tenderness.  Musculoskeletal: Normal range of motion.  Lymphadenopathy:    He has no cervical adenopathy.  Neurological: He is alert and oriented to person, place, and time. No cranial nerve deficit.  Skin: Skin is warm and dry. He is not diaphoretic.  Few, red, irritated rash on the left elbow and a few on the forearms. No evidence of inflammation or infection at this time .  Psychiatric: He has a normal mood and affect. His behavior is normal. Judgment and thought content normal.  Nursing note and vitals reviewed.   Assessment/Plan: 1. Uncontrolled type 2 diabetes mellitus with hyperglycemia (HCC) - POCT HgB A1C 6.9 today. Continue toujeo and oral diabetic medication as prescribed. Refer for diabetic eye exam.  - Ambulatory referral to Ophthalmology  2. Essential hypertension Elevated today, but generally stable. No changes to bp medication today. Will consider increasing medication if continued elevation. Patient to monitor at home.   3. Atopic dermatitis, unspecified type - triamcinolone (KENALOG) 0.025 % cream; Apply 1 application topically 2 (two) times daily.  Dispense: 80 g; Refill: 2  4. Screening for colon cancer Refer to GI for screening colonoscopy.  - Ambulatory referral to Gastroenterology  General Counseling: davie claud understanding of the findings of todays visit and agrees with plan of treatment. I have discussed any further diagnostic evaluation that may be needed or ordered today. We also reviewed his medications today. he has been encouraged to call the office with any questions or concerns that should arise related to todays visit.  Hypertension Counseling:   The following hypertensive lifestyle modification were recommended and discussed:  1. Limiting alcohol intake to less than 1 oz/day of ethanol:(24 oz of beer or  8 oz of wine or 2 oz of 100-proof whiskey). 2. Take baby ASA 81 mg daily. 3. Importance of regular aerobic exercise and losing weight. 4. Reduce dietary saturated fat and cholesterol intake for overall cardiovascular health. 5. Maintaining adequate dietary potassium, calcium, and magnesium intake. 6. Regular monitoring of the blood pressure. 7. Reduce sodium intake to less than 100 mmol/day (less than 2.3 gm of sodium or less than 6 gm of sodium choride)   This patient was seen by [place] with Dr Timoteo Gaul  Humphrey Rolls as a part of collaborative care agreement  Orders Placed This Encounter  Procedures  . Ambulatory referral to Ophthalmology  . Ambulatory referral to Gastroenterology  . POCT HgB A1C      Time spent: 25 Minutes    Dr Lavera Guise Internal medicine

## 2018-02-13 DIAGNOSIS — G4730 Sleep apnea, unspecified: Secondary | ICD-10-CM | POA: Diagnosis not present

## 2018-02-20 ENCOUNTER — Other Ambulatory Visit: Payer: Self-pay | Admitting: Internal Medicine

## 2018-02-21 ENCOUNTER — Other Ambulatory Visit: Payer: Self-pay

## 2018-02-21 MED ORDER — HYDROCHLOROTHIAZIDE 25 MG PO TABS: ORAL_TABLET | ORAL | 1 refills | Status: DC

## 2018-02-21 MED ORDER — MELOXICAM 7.5 MG PO TABS: ORAL_TABLET | ORAL | 1 refills | Status: DC

## 2018-02-21 MED ORDER — ATORVASTATIN CALCIUM 10 MG PO TABS: 10.0000 mg | ORAL_TABLET | Freq: Every day | ORAL | 1 refills | Status: DC

## 2018-02-21 MED ORDER — GEMFIBROZIL 600 MG PO TABS: 600.0000 mg | ORAL_TABLET | Freq: Two times a day (BID) | ORAL | 1 refills | Status: DC

## 2018-02-21 MED ORDER — OMEPRAZOLE 40 MG PO CPDR: 40.0000 mg | DELAYED_RELEASE_CAPSULE | Freq: Every day | ORAL | 1 refills | Status: DC

## 2018-02-22 ENCOUNTER — Ambulatory Visit: Payer: Self-pay | Admitting: Internal Medicine

## 2018-02-22 ENCOUNTER — Other Ambulatory Visit: Payer: Self-pay

## 2018-02-23 ENCOUNTER — Other Ambulatory Visit: Payer: Self-pay

## 2018-02-23 MED ORDER — METOPROLOL SUCCINATE ER 50 MG PO TB24: 50.0000 mg | ORAL_TABLET | Freq: Every day | ORAL | 1 refills | Status: DC

## 2018-02-27 ENCOUNTER — Other Ambulatory Visit: Payer: Self-pay | Admitting: Nurse Practitioner

## 2018-02-27 DIAGNOSIS — E1129 Type 2 diabetes mellitus with other diabetic kidney complication: Secondary | ICD-10-CM | POA: Diagnosis not present

## 2018-02-27 DIAGNOSIS — Z0001 Encounter for general adult medical examination with abnormal findings: Secondary | ICD-10-CM | POA: Diagnosis not present

## 2018-02-27 DIAGNOSIS — I10 Essential (primary) hypertension: Secondary | ICD-10-CM | POA: Diagnosis not present

## 2018-02-28 LAB — CBC WITH DIFFERENTIAL/PLATELET
BASOS: 1 %
Basophils Absolute: 0.1 10*3/uL (ref 0.0–0.2)
EOS (ABSOLUTE): 0.3 10*3/uL (ref 0.0–0.4)
Eos: 3 %
HEMOGLOBIN: 11 g/dL — AB (ref 13.0–17.7)
Hematocrit: 33.4 % — ABNORMAL LOW (ref 37.5–51.0)
IMMATURE GRANS (ABS): 0 10*3/uL (ref 0.0–0.1)
Immature Granulocytes: 0 %
LYMPHS ABS: 2.2 10*3/uL (ref 0.7–3.1)
LYMPHS: 29 %
MCH: 25.9 pg — ABNORMAL LOW (ref 26.6–33.0)
MCHC: 32.9 g/dL (ref 31.5–35.7)
MCV: 79 fL (ref 79–97)
Monocytes Absolute: 0.5 10*3/uL (ref 0.1–0.9)
Monocytes: 6 %
NEUTROS ABS: 4.7 10*3/uL (ref 1.4–7.0)
Neutrophils: 61 %
PLATELETS: 248 10*3/uL (ref 150–450)
RBC: 4.24 x10E6/uL (ref 4.14–5.80)
RDW: 15.6 % — ABNORMAL HIGH (ref 12.3–15.4)
WBC: 7.8 10*3/uL (ref 3.4–10.8)

## 2018-02-28 LAB — COMPREHENSIVE METABOLIC PANEL
A/G RATIO: 1.4 (ref 1.2–2.2)
ALBUMIN: 3.6 g/dL (ref 3.6–4.8)
ALT: 13 IU/L (ref 0–44)
AST: 15 IU/L (ref 0–40)
Alkaline Phosphatase: 67 IU/L (ref 39–117)
BUN / CREAT RATIO: 19 (ref 10–24)
BUN: 24 mg/dL (ref 8–27)
Bilirubin Total: 0.3 mg/dL (ref 0.0–1.2)
CHLORIDE: 102 mmol/L (ref 96–106)
CO2: 23 mmol/L (ref 20–29)
Calcium: 9.5 mg/dL (ref 8.6–10.2)
Creatinine, Ser: 1.26 mg/dL (ref 0.76–1.27)
GFR calc non Af Amer: 60 mL/min/{1.73_m2} (ref >59)
GFR, EST AFRICAN AMERICAN: 69 mL/min/{1.73_m2} (ref >59)
Globulin, Total: 2.6 g/dL (ref 1.5–4.5)
Glucose: 86 mg/dL (ref 65–99)
POTASSIUM: 4.9 mmol/L (ref 3.5–5.2)
Sodium: 141 mmol/L (ref 134–144)
TOTAL PROTEIN: 6.2 g/dL (ref 6.0–8.5)

## 2018-02-28 LAB — T4, FREE: Free T4: 0.84 ng/dL (ref 0.82–1.77)

## 2018-02-28 LAB — TSH: TSH: 1.55 u[IU]/mL (ref 0.450–4.500)

## 2018-02-28 LAB — LIPID PANEL
Chol/HDL Ratio: 4.2 ratio (ref 0.0–5.0)
Cholesterol, Total: 151 mg/dL (ref 100–199)
HDL: 36 mg/dL — ABNORMAL LOW (ref >39)
LDL Calculated: 94 mg/dL (ref 0–99)
Triglycerides: 107 mg/dL (ref 0–149)
VLDL Cholesterol Cal: 21 mg/dL (ref 5–40)

## 2018-02-28 LAB — PSA: PROSTATE SPECIFIC AG, SERUM: 2.7 ng/mL (ref 0.0–4.0)

## 2018-03-15 DIAGNOSIS — G4730 Sleep apnea, unspecified: Secondary | ICD-10-CM | POA: Diagnosis not present

## 2018-03-16 ENCOUNTER — Other Ambulatory Visit: Payer: Self-pay | Admitting: Internal Medicine

## 2018-03-22 ENCOUNTER — Ambulatory Visit: Payer: Medicare PPO | Admitting: Internal Medicine

## 2018-03-22 ENCOUNTER — Encounter: Payer: Self-pay | Admitting: Internal Medicine

## 2018-03-22 VITALS — BP 156/98 | HR 64 | Resp 16 | Ht 71.0 in | Wt 268.0 lb

## 2018-03-22 DIAGNOSIS — J449 Chronic obstructive pulmonary disease, unspecified: Secondary | ICD-10-CM

## 2018-03-22 DIAGNOSIS — G4733 Obstructive sleep apnea (adult) (pediatric): Secondary | ICD-10-CM

## 2018-03-22 DIAGNOSIS — Z9989 Dependence on other enabling machines and devices: Secondary | ICD-10-CM | POA: Diagnosis not present

## 2018-03-22 DIAGNOSIS — F17219 Nicotine dependence, cigarettes, with unspecified nicotine-induced disorders: Secondary | ICD-10-CM

## 2018-03-22 NOTE — Progress Notes (Signed)
[facility] [place], [place] [address]  Pulmonary Sleep Medicine   Office Visit Note  Patient Name: Larry Callahan DOB: 09/15/1955 MRN [id number]  Date of Service: 03/22/2018  Complaints/HPI: Pt here for follow up on OSA, and COPD.  He denies recent hospitalizations.  Denies any difficulty breathing.  He reports using his CPAP every night.  He is cleaning his mask but denies switching his tubing in quite awhile.  He denies sinus issues, chest pain, or SOB at this time.    ROS  General: (-) fever, (-) chills, (-) night sweats, (-) weakness Skin: (-) rashes, (-) itching,. Eyes: (-) visual changes, (-) redness, (-) itching. Nose and Sinuses: (-) nasal stuffiness or itchiness, (-) postnasal drip, (-) nosebleeds, (-) sinus trouble. Mouth and Throat: (-) sore throat, (-) hoarseness. Neck: (-) swollen glands, (-) enlarged thyroid, (-) neck pain. Respiratory: - cough, (-) bloody sputum, - shortness of breath, - wheezing. Cardiovascular: - ankle swelling, (-) chest pain. Lymphatic: (-) lymph node enlargement. Neurologic: (-) numbness, (-) tingling. Psychiatric: (-) anxiety, (-) depression   Current Medication: Outpatient Encounter Medications as of 03/22/2018  Medication Sig  . Alcohol Swabs (B-D SINGLE USE SWABS BUTTERFLY) PADS by Does not apply route.  Marland Kitchen amLODipine-benazepril (LOTREL) 5-20 MG capsule TAKE  (1)  CAPSULE  TWICE DAILY.  Marland Kitchen aspirin EC 81 MG tablet Take 81 mg by mouth daily.  Marland Kitchen atorvastatin (LIPITOR) 10 MG tablet Take 1 tablet (10 mg total) by mouth daily.  . Blood Glucose Monitoring Suppl (TRUE METRIX AIR GLUCOSE METER) DEVI by Does not apply route.  Marland Kitchen gemfibrozil (LOPID) 600 MG tablet Take 1 tablet (600 mg total) by mouth 2 (two) times daily with a meal.  . glucose blood test strip 1 each by Other route as needed for other (true metrix test strip). Use as instructed  . glyBURIDE-metformin (GLUCOVANCE) 5-500 MG tablet 2 with breakfast  and 2 with dinner  . hydrochlorothiazide (HYDRODIURIL) 25 MG tablet TAKE 1 TABLET EVERY MORNING FOR BLOOD PRESSURE  . meloxicam (MOBIC) 7.5 MG tablet TAKE 1 TABLET TWICE DAILY AS NEEDED FOR  JOINT  PAIN  . metoprolol succinate (TOPROL-XL) 50 MG 24 hr tablet Take 1 tablet (50 mg total) by mouth at bedtime. Take with or immediately following a meal.  . NOVOFINE 32G X 6 MM MISC To use with [place] injections daily  . omeprazole (PRILOSEC) 40 MG capsule Take 1 capsule (40 mg total) by mouth daily.  Nelva Nay SOLOSTAR 300 UNIT/ML SOPN INJECT  10 UNITS SUBCUTANEOUSLY EVERY NIGHT -DISCARD PEN 42 DAYS AFTER OPENING  . triamcinolone (KENALOG) 0.025 % cream Apply 1 application topically 2 (two) times daily.  . TRUEPLUS LANCETS 33G MISC by Does not apply route.   No facility-administered encounter medications on file as of 03/22/2018.     Surgical History: Past Surgical History:  Procedure Laterality Date  . CHOLECYSTECTOMY    . left arm surgery    . nephrectomy Left   . PARATHYROIDECTOMY      Medical History: Past Medical History:  Diagnosis Date  . Allergy   . Coronary artery disease   . Diabetes mellitus without complication ([place])   . GERD (gastroesophageal reflux disease)   . Hyperlipidemia   . Hypertension   . Sleep apnea     Family History: Family History  Problem Relation Age of Onset  . Diabetes Mother   . Lung cancer Father   . Diabetes Sister   . Hypertension Sister   .  Heart disease Sister   . Cancer Sister   . Bone cancer Brother     Social History: Social History   Socioeconomic History  . Marital status: Single    Spouse name: Not on file  . Number of children: Not on file  . Years of education: Not on file  . Highest education level: Not on file  Occupational History  . Not on file  Social Needs  . Financial resource strain: Not on file  . Food insecurity:    Worry: Not on file    Inability: Not on file  . Transportation needs:    Medical: Not on file     Non-medical: Not on file  Tobacco Use  . Smoking status: Current Every Day Smoker    Types: Cigarettes  . Smokeless tobacco: Never Used  Substance and Sexual Activity  . Alcohol use: No    Frequency: Never  . Drug use: No  . Sexual activity: Not on file  Lifestyle  . Physical activity:    Days per week: Not on file    Minutes per session: Not on file  . Stress: Not on file  Relationships  . Social connections:    Talks on phone: Not on file    Gets together: Not on file    Attends religious service: Not on file    Active member of club or organization: Not on file    Attends meetings of clubs or organizations: Not on file    Relationship status: Not on file  . Intimate partner violence:    Fear of current or ex partner: Not on file    Emotionally abused: Not on file    Physically abused: Not on file    Forced sexual activity: Not on file  Other Topics Concern  . Not on file  Social History Narrative  . Not on file    Vital Signs: Blood pressure (!) 156/98, pulse 64, resp. rate 16, height 5\' 11"  (1.803 m), weight 268 lb (121.6 kg), SpO2 95 %.  Examination: General Appearance: The patient is well-developed, well-nourished, and in no distress. Skin: Gross inspection of skin unremarkable. Head: normocephalic, no gross deformities. Eyes: no gross deformities noted. ENT: ears appear grossly normal no exudates. Neck: Supple. No thyromegaly. No LAD. Respiratory: Clear to auscultation bilaterly. Cardiovascular: Normal S1 and S2 without murmur or rub. Extremities: No cyanosis. pulses are equal. Neurologic: Alert and oriented. No involuntary movements.  LABS: Recent Results (from the past 2160 hour(s))  POCT HgB A1C     Status: Abnormal   Collection Time: 02/02/18 10:01 AM  Result Value Ref Range   Hemoglobin A1C 6.9 (A) 4.0 - 5.6 %   HbA1c POC (<> result, manual entry)     HbA1c, POC (prediabetic range)     HbA1c, POC (controlled diabetic range)    CBC with  Differential/Platelet     Status: Abnormal   Collection Time: 02/27/18  8:44 AM  Result Value Ref Range   WBC 7.8 3.4 - 10.8 x10E3/uL   RBC 4.24 4.14 - 5.80 x10E6/uL   Hemoglobin 11.0 (L) 13.0 - 17.7 g/dL   Hematocrit 33.4 (L) 37.5 - 51.0 %   MCV 79 79 - 97 fL   MCH 25.9 (L) 26.6 - 33.0 pg   MCHC 32.9 31.5 - 35.7 g/dL   RDW 15.6 (H) 12.3 - 15.4 %   Platelets 248 150 - 450 x10E3/uL   Neutrophils 61 Not Estab. %   Lymphs 29 Not Estab. %  Monocytes 6 Not Estab. %   Eos 3 Not Estab. %   Basos 1 Not Estab. %   Neutrophils Absolute 4.7 1.4 - 7.0 x10E3/uL   Lymphocytes Absolute 2.2 0.7 - 3.1 x10E3/uL   Monocytes Absolute 0.5 0.1 - 0.9 x10E3/uL   EOS (ABSOLUTE) 0.3 0.0 - 0.4 x10E3/uL   Basophils Absolute 0.1 0.0 - 0.2 x10E3/uL   Immature Granulocytes 0 Not Estab. %   Immature Grans (Abs) 0.0 0.0 - 0.1 x10E3/uL  Comprehensive metabolic panel     Status: None   Collection Time: 02/27/18  8:44 AM  Result Value Ref Range   Glucose 86 65 - 99 mg/dL   BUN 24 8 - 27 mg/dL   Creatinine, Ser 1.26 0.76 - 1.27 mg/dL   GFR calc non Af Amer 60 >59 mL/min/1.73   GFR calc Af Amer 69 >59 mL/min/1.73   BUN/Creatinine Ratio 19 10 - 24   Sodium 141 134 - 144 mmol/L   Potassium 4.9 3.5 - 5.2 mmol/L   Chloride 102 96 - 106 mmol/L   CO2 23 20 - 29 mmol/L   Calcium 9.5 8.6 - 10.2 mg/dL   Total Protein 6.2 6.0 - 8.5 g/dL   Albumin 3.6 3.6 - 4.8 g/dL   Globulin, Total 2.6 1.5 - 4.5 g/dL   Albumin/Globulin Ratio 1.4 1.2 - 2.2   Bilirubin Total 0.3 0.0 - 1.2 mg/dL   Alkaline Phosphatase 67 39 - 117 IU/L   AST 15 0 - 40 IU/L   ALT 13 0 - 44 IU/L  Lipid panel     Status: Abnormal   Collection Time: 02/27/18  8:44 AM  Result Value Ref Range   Cholesterol, Total 151 100 - 199 mg/dL   Triglycerides 107 0 - 149 mg/dL   HDL 36 (L) >39 mg/dL   VLDL Cholesterol Cal 21 5 - 40 mg/dL   LDL Calculated 94 0 - 99 mg/dL   Chol/HDL Ratio 4.2 0.0 - 5.0 ratio    Comment:                                   T.  Chol/HDL Ratio                                             Men  Women                               1/2 Avg.Risk  3.4    3.3                                   Avg.Risk  5.0    4.4                                2X Avg.Risk  9.6    7.1                                3X Avg.Risk 23.4   11.0   T4, free     Status: None   Collection Time: 02/27/18  8:44  AM  Result Value Ref Range   Free T4 0.84 0.82 - 1.77 ng/dL  TSH     Status: None   Collection Time: 02/27/18  8:44 AM  Result Value Ref Range   TSH 1.550 0.450 - 4.500 uIU/mL  PSA     Status: None   Collection Time: 02/27/18  8:44 AM  Result Value Ref Range   Prostate Specific Ag, Serum 2.7 0.0 - 4.0 ng/mL    Comment: Roche ECLIA methodology. According to the American Urological Association, Serum PSA should decrease and remain at undetectable levels after radical prostatectomy. The AUA defines biochemical recurrence as an initial PSA value 0.2 ng/mL or greater followed by a subsequent confirmatory PSA value 0.2 ng/mL or greater. Values obtained with different assay methods or kits cannot be used interchangeably. Results cannot be interpreted as absolute evidence of the presence or absence of malignant disease.     Radiology: Dg Knee 4 Views W/patella Left  Result Date: 04/06/2014 * PRIOR REPORT IMPORTED FROM AN EXTERNAL SYSTEM * CLINICAL DATA:  No injury. Left knee pain for 3 days. Pain is anterior. EXAM: LEFT KNEE - COMPLETE 4+ VIEW COMPARISON:  None. FINDINGS: No fracture.  No bone lesion. Knee joint is normally space and aligned. There are small marginal osteophytes from the medial compartment and patella. An ossified density projects within the central aspect of the knee, in the intercondylar notch, anterior to the tibial spine. This may be intra-articular. There is a moderate joint effusion. Mild nonspecific soft tissue edema is seen. IMPRESSION: 1. No fracture dislocation. 2. Mild degenerative changes reflected by an medial  compartment and patellofemoral joint space compartment marginal spurring. 3. Possible small intra-articular body near the anterior central aspect of the knee. 4. Moderate joint effusion. Electronically Signed   By: Lajean Manes M.D.   On: 04/06/2014 08:37     No results found.  No results found.    Assessment and Plan: Patient Active Problem List   Diagnosis Date Noted  . Screening for prostate cancer 11/06/2017  . Uncontrolled type 2 diabetes mellitus with hyperglycemia ([place]) 11/06/2017  . Dermatitis, unspecified 06/09/2017  . Atherosclerotic heart disease of native coronary artery without angina pectoris 06/09/2017  . 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 Obstructive sleep apnea, adult 06/09/2017  . Morbid (severe) obesity due to excess calories ([place]) 06/09/2017  . Nicotine dependence, cigarettes, uncomplicated 32/99/2426  . Type 2 diabetes mellitus with diabetic polyneuropathy ([place]) 06/09/2017  . Allergic rhinitis due to pollen 06/09/2017  . Hyperlipidemia 06/09/2017  . Chronic obstructive pulmonary disease, unspecified ([place]) 06/09/2017  . Shortness of breath 06/09/2017  . Wheezing 06/09/2017  . Dysuria 06/09/2017  . Snoring 06/09/2017  . Hypertension 06/09/2017  . Personal history of other malignant neoplasm of kidney 06/09/2017  . Pain in right hip 06/09/2017  . Impacted cerumen, bilateral 06/09/2017  . Secondary hyperparathyroidism, not elsewhere classified ([place]) 06/09/2017  . Type 2 diabetes mellitus with hyperglycemia ([place]) 06/09/2017  . Tinea corporis 06/09/2017    1. OSA on CPAP Encouraged patient to continue nightly use of CPAP. He need to get supplies more often.  Will have him follow up with company or in sleep clinic as needed.   2. Chronic obstructive pulmonary disease, unspecified COPD type ([place]) Pt is doing well with COPD.  Taking inhalers as prescribed.  Continue current therapy.   3. Cigarette nicotine dependence with  nicotine-induced disorder Smoking cessation counseling: 1. Pt acknowledges the risks of long term smoking,  she will try to quite smoking. 2. Options for different medications including nicotine products, chewing gum, patch etc, Wellbutrin and Chantix is discussed 3. Goal and date of compete cessation is discussed 4. Total time spent in smoking cessation is 16min.   4. Morbid obesity ([place]) Obesity Counseling: Risk Assessment: An assessment of behavioral risk factors was made today and includes lack of exercise sedentary lifestyle, lack of portion control and poor dietary habits.  Risk Modification Advice: She was counseled on portion control guidelines. Restricting daily caloric intake to. . The detrimental long term effects of obesity on her health and ongoing poor compliance was also discussed with the patient.    General Counseling: I have discussed the findings of the evaluation and examination with Larry Callahan.  I have also discussed any further diagnostic evaluation thatmay be needed or ordered today. Valerio verbalizes understanding of the findings of todays visit. We also reviewed his medications today and discussed drug interactions and side effects including but not limited excessive drowsiness and altered mental states. We also discussed that there is always a risk not just to him but also people around him. he has been encouraged to call the office with any questions or concerns that should arise related to todays visit.    Time spent: 25 This patient was seen by Orson Gear AGNP-C in Collaboration with Dr. Devona Konig as a part of collaborative care agreement.   I have personally obtained a history, examined the patient, evaluated laboratory and imaging results, formulated the assessment and plan and placed orders.    [name], MD [facility] Pulmonary and Critical Care Sleep medicine

## 2018-03-22 NOTE — Patient Instructions (Signed)
Sleep Apnea Sleep apnea is a condition that affects breathing. People with sleep apnea have moments during sleep when their breathing pauses briefly or gets shallow. Sleep apnea can cause these symptoms:  Trouble staying asleep.  Sleepiness or tiredness during the day.  Irritability.  Loud snoring.  Morning headaches.  Trouble concentrating.  Forgetting things.  Less interest in sex.  Being sleepy for no reason.  Mood swings.  Personality changes.  Depression.  Waking up a lot during the night to pee (urinate).  Dry mouth.  Sore throat.  Follow these instructions at home:  Make any changes in your routine that your doctor recommends.  Eat a healthy, well-balanced diet.  Take over-the-counter and prescription medicines only as told by your doctor.  Avoid using alcohol, calming medicines (sedatives), and narcotic medicines.  Take steps to lose weight if you are overweight.  If you were given a machine (device) to use while you sleep, use it only as told by your doctor.  Do not use any tobacco products, such as cigarettes, chewing tobacco, and e-cigarettes. If you need help quitting, ask your doctor.  Keep all follow-up visits as told by your doctor. This is important. Contact a doctor if:  The machine that you were given to use during sleep is uncomfortable or does not seem to be working.  Your symptoms do not get better.  Your symptoms get worse. Get help right away if:  Your chest hurts.  You have trouble breathing in enough air (shortness of breath).  You have an uncomfortable feeling in your back, arms, or stomach.  You have trouble talking.  One side of your body feels weak.  A part of your face is hanging down (drooping). These symptoms may be an emergency. Do not wait to see if the symptoms will go away. Get medical help right away. Call your local emergency services (911 in the [place] Do not drive yourself to the hospital. This information  is not intended to replace advice given to you by your health care provider. Make sure you discuss any questions you have with your health care provider. Document Released: 03/08/2008 Document Revised: 01/24/2016 Document Reviewed: 03/09/2015 Elsevier Interactive Patient Education  2018 [employer] Chronic Obstructive Pulmonary Disease Chronic obstructive pulmonary disease (COPD) is a long-term (chronic) lung problem. When you have COPD, it is hard for air to get in and out of your lungs. The way your lungs work will never return to normal. Usually the condition gets worse over time. There are things you can do to keep yourself as healthy as possible. Your doctor may treat your condition with:  Medicines.  Quitting smoking, if you smoke.  Rehabilitation. This may involve a team of specialists.  Oxygen.  Exercise and changes to your diet.  Lung surgery.  Comfort measures (palliative care).  Follow these instructions at home: Medicines  Take over-the-counter and prescription medicines only as told by your doctor.  Talk to your doctor before taking any cough or allergy medicines. You may need to avoid medicines that cause your lungs to be dry. Lifestyle  If you smoke, stop. Smoking makes the problem worse. If you need help quitting, ask your doctor.  Avoid being around things that make your breathing worse. This may include smoke, chemicals, and fumes.  Stay active, but remember to also rest.  Learn and use tips on how to relax.  Make sure you get enough sleep. Most adults need at least 7 hours a night.  Eat healthy foods.  Eat smaller meals more often. Rest before meals. Controlled breathing  Learn and use tips on how to control your breathing as told by your doctor. Try: ? Breathing in (inhaling) through your nose for 1 second. Then, pucker your lips and breath out (exhale) through your lips for 2 seconds. ? Putting one hand on your belly (abdomen). Breathe in slowly  through your nose for 1 second. Your hand on your belly should move out. Pucker your lips and breathe out slowly through your lips. Your hand on your belly should move in as you breathe out. Controlled coughing  Learn and use controlled coughing to clear mucus from your lungs. The steps are: 1. Lean your head a little forward. 2. Breathe in deeply. 3. Try to hold your breath for 3 seconds. 4. Keep your mouth slightly open while coughing 2 times. 5. Spit any mucus out into a tissue. 6. Rest and do the steps again 1 or 2 times as needed. General instructions  Make sure you get all the shots (vaccines) that your doctor recommends. Ask your doctor about a flu shot and a pneumonia shot.  Use oxygen therapy and therapy to help improve your lungs (pulmonary rehabilitation) if told by your doctor. If you need home oxygen therapy, ask your doctor if you should buy a tool to measure your oxygen level (oximeter).  Make a COPD action plan with your doctor. This helps you know what to do if you feel worse than usual.  Manage any other conditions you have as told by your doctor.  Avoid going outside when it is very hot, cold, or humid.  Avoid people who have a sickness you can catch (contagious).  Keep all follow-up visits as told by your doctor. This is important. Contact a doctor if:  You cough up more mucus than usual.  There is a change in the color or thickness of the mucus.  It is harder to breathe than usual.  Your breathing is faster than usual.  You have trouble sleeping.  You need to use your medicines more often than usual.  You have trouble doing your normal activities such as getting dressed or walking around the house. Get help right away if:  You have shortness of breath while resting.  You have shortness of breath that stops you from: ? Being able to talk. ? Doing normal activities.  Your chest hurts for longer than 5 minutes.  Your skin color is more blue than  usual.  Your pulse oximeter shows that you have low oxygen for longer than 5 minutes.  You have a fever.  You feel too tired to breathe normally. Summary  Chronic obstructive pulmonary disease (COPD) is a long-term lung problem.  The way your lungs work will never return to normal. Usually the condition gets worse over time. There are things you can do to keep yourself as healthy as possible.  Take over-the-counter and prescription medicines only as told by your doctor.  If you smoke, stop. Smoking makes the problem worse. This information is not intended to replace advice given to you by your health care provider. Make sure you discuss any questions you have with your health care provider. Document Released: 11/16/2007 Document Revised: 11/05/2015 Document Reviewed: 01/24/2013 Elsevier Interactive Patient Education  2017 [employer].

## 2018-03-26 ENCOUNTER — Telehealth: Payer: Self-pay

## 2018-03-26 NOTE — Telephone Encounter (Signed)
Faxed order to [facility] for cpap supplies and curretn download request.Larry Callahan

## 2018-03-29 ENCOUNTER — Other Ambulatory Visit: Payer: Self-pay | Admitting: Internal Medicine

## 2018-04-03 ENCOUNTER — Other Ambulatory Visit: Payer: Self-pay

## 2018-04-03 MED ORDER — GLUCOSE BLOOD VI STRP: ORAL_STRIP | 3 refills | Status: DC

## 2018-04-04 ENCOUNTER — Other Ambulatory Visit: Payer: Self-pay

## 2018-04-04 MED ORDER — BD SWABS SINGLE USE BUTTERFLY PADS: MEDICATED_PAD | 3 refills | Status: DC

## 2018-04-04 MED ORDER — GLYBURIDE-METFORMIN 5-500 MG PO TABS: ORAL_TABLET | ORAL | 0 refills | Status: DC

## 2018-04-04 MED ORDER — TRUEPLUS LANCETS 33G MISC: 3 refills | Status: DC

## 2018-04-04 MED ORDER — TRUE METRIX AIR GLUCOSE METER DEVI: 1.0000 | Freq: Two times a day (BID) | 0 refills | Status: DC

## 2018-04-04 MED ORDER — TRUE METRIX LEVEL 1 LOW VI SOLN: 1 refills | Status: DC

## 2018-04-04 MED ORDER — GLUCOSE BLOOD VI STRP: ORAL_STRIP | 3 refills | Status: DC

## 2018-04-13 ENCOUNTER — Other Ambulatory Visit: Payer: Self-pay

## 2018-04-13 DIAGNOSIS — G4733 Obstructive sleep apnea (adult) (pediatric): Secondary | ICD-10-CM | POA: Diagnosis not present

## 2018-04-13 MED ORDER — TRUE METRIX AIR GLUCOSE METER DEVI: 1.0000 | Freq: Two times a day (BID) | 0 refills | Status: AC

## 2018-04-15 DIAGNOSIS — G4730 Sleep apnea, unspecified: Secondary | ICD-10-CM | POA: Diagnosis not present

## 2018-05-04 ENCOUNTER — Ambulatory Visit: Payer: Self-pay | Admitting: Nurse Practitioner

## 2018-05-15 DIAGNOSIS — G4730 Sleep apnea, unspecified: Secondary | ICD-10-CM | POA: Diagnosis not present

## 2018-05-25 ENCOUNTER — Ambulatory Visit: Payer: Self-pay | Admitting: Nurse Practitioner

## 2018-06-08 ENCOUNTER — Ambulatory Visit: Payer: Self-pay | Admitting: Nurse Practitioner

## 2018-06-15 DIAGNOSIS — G4730 Sleep apnea, unspecified: Secondary | ICD-10-CM | POA: Diagnosis not present

## 2018-06-19 ENCOUNTER — Other Ambulatory Visit: Payer: Self-pay

## 2018-06-19 MED ORDER — GLYBURIDE-METFORMIN 5-500 MG PO TABS: ORAL_TABLET | ORAL | 0 refills | Status: DC

## 2018-06-19 MED ORDER — AMLODIPINE BESY-BENAZEPRIL HCL 5-20 MG PO CAPS: ORAL_CAPSULE | ORAL | 0 refills | Status: DC

## 2018-06-29 ENCOUNTER — Ambulatory Visit: Payer: Medicare PPO | Admitting: Nurse Practitioner

## 2018-06-29 ENCOUNTER — Encounter: Payer: Self-pay | Admitting: Nurse Practitioner

## 2018-06-29 VITALS — BP 148/90 | HR 65 | Resp 16 | Ht 71.0 in | Wt 280.0 lb

## 2018-06-29 DIAGNOSIS — E1165 Type 2 diabetes mellitus with hyperglycemia: Secondary | ICD-10-CM

## 2018-06-29 DIAGNOSIS — F17219 Nicotine dependence, cigarettes, with unspecified nicotine-induced disorders: Secondary | ICD-10-CM | POA: Diagnosis not present

## 2018-06-29 DIAGNOSIS — I10 Essential (primary) hypertension: Secondary | ICD-10-CM

## 2018-06-29 DIAGNOSIS — E782 Mixed hyperlipidemia: Secondary | ICD-10-CM

## 2018-06-29 LAB — POCT GLYCOSYLATED HEMOGLOBIN (HGB A1C): HEMOGLOBIN A1C: 7.2 % — AB (ref 4.0–5.6)

## 2018-06-29 NOTE — Progress Notes (Signed)
[facility] [place], [place] [address]  Internal MEDICINE  Office Visit Note  Patient Name: Larry Callahan  [id number]  [id number]  Date of Service: 06/29/2018  Chief Complaint  Patient presents with  . Medical Management of Chronic Issues    55month follow up  . Diabetes    Hypertension  This is a chronic problem. The current episode started more than 1 year ago. The problem is unchanged. The problem is controlled. Associated symptoms include headaches. Pertinent negatives include no chest pain, neck pain, palpitations or shortness of breath. There are no associated agents to hypertension. Risk factors for coronary artery disease include diabetes mellitus, dyslipidemia, male gender and obesity. Past treatments include ACE inhibitors, beta blockers and calcium channel blockers. The current treatment provides moderate improvement. Compliance problems include diet and exercise.  Hypertensive end-organ damage includes CAD/MI and PVD.  Diabetes  He presents for his follow-up diabetic visit. He has type 2 diabetes mellitus. No MedicAlert identification noted. His disease course has been stable. Hypoglycemia symptoms include headaches. Pertinent negatives for hypoglycemia include no nervousness/anxiousness or tremors. There are no diabetic associated symptoms. Pertinent negatives for diabetes include no chest pain, no fatigue, no polydipsia and no polyuria. There are no hypoglycemic complications. Symptoms are stable. Diabetic complications include PVD. Risk factors for coronary artery disease include diabetes mellitus, dyslipidemia, hypertension and male sex. Current diabetic treatment includes oral agent (dual therapy). He is compliant with treatment most of the time. His weight is stable. He is following a generally healthy diet. He participates in exercise intermittently. There is no change in his home blood glucose trend. An ACE inhibitor/angiotensin II receptor  blocker is not being taken. He does not see a podiatrist.Eye exam is current.        Current Medication: Outpatient Encounter Medications as of 06/29/2018  Medication Sig  . Alcohol Swabs (B-D SINGLE USE SWABS BUTTERFLY) PADS Use as directed twice a daily diag e11.65  . amLODipine-benazepril (LOTREL) 5-20 MG capsule TAKE  (1)  CAPSULE  TWICE DAILY.  Marland Kitchen aspirin EC 81 MG tablet Take 81 mg by mouth daily.  Marland Kitchen atorvastatin (LIPITOR) 10 MG tablet Take 1 tablet (10 mg total) by mouth daily.  . Blood Glucose Calibration (TRUE METRIX LEVEL 1) Low SOLN Use as directed  . Blood Glucose Monitoring Suppl (TRUE METRIX AIR GLUCOSE METER) DEVI 1 Device by Does not apply route 2 (two) times daily.  Marland Kitchen gemfibrozil (LOPID) 600 MG tablet Take 1 tablet (600 mg total) by mouth 2 (two) times daily with a meal.  . glucose blood (TRUE METRIX BLOOD GLUCOSE TEST) test strip Use as instructed twice daily diag E11.65  . glyBURIDE-metformin (GLUCOVANCE) 5-500 MG tablet 2 with breakfast and 2 with dinner  . hydrochlorothiazide (HYDRODIURIL) 25 MG tablet TAKE 1 TABLET EVERY MORNING FOR BLOOD PRESSURE  . meloxicam (MOBIC) 7.5 MG tablet TAKE 1 TABLET TWICE DAILY AS NEEDED FOR  JOINT  PAIN  . metoprolol succinate (TOPROL-XL) 50 MG 24 hr tablet Take 1 tablet (50 mg total) by mouth at bedtime. Take with or immediately following a meal.  . NOVOFINE 32G X 6 MM MISC To use with [place] injections daily  . omeprazole (PRILOSEC) 40 MG capsule Take 1 capsule (40 mg total) by mouth daily.  Nelva Nay SOLOSTAR 300 UNIT/ML SOPN INJECT  10 UNITS SUBCUTANEOUSLY EVERY NIGHT -DISCARD PEN 42 DAYS AFTER OPENING  . triamcinolone (KENALOG) 0.025 % cream Apply 1 application topically 2 (two) times daily.  Karen Chafe  LANCETS 33G MISC Use as directed twice daily diag E11.65  . Alcohol Swabs PADS 1 each.   No facility-administered encounter medications on file as of 06/29/2018.     Surgical History: Past Surgical History:  Procedure Laterality Date   . CHOLECYSTECTOMY    . left arm surgery    . nephrectomy Left   . PARATHYROIDECTOMY      Medical History: Past Medical History:  Diagnosis Date  . Allergy   . Coronary artery disease   . Diabetes mellitus without complication ([place])   . GERD (gastroesophageal reflux disease)   . Hyperlipidemia   . Hypertension   . Sleep apnea     Family History: Family History  Problem Relation Age of Onset  . Diabetes Mother   . Lung cancer Father   . Diabetes Sister   . Hypertension Sister   . Heart disease Sister   . Cancer Sister   . Bone cancer Brother     Social History   Socioeconomic History  . Marital status: Single    Spouse name: Not on file  . Number of children: Not on file  . Years of education: Not on file  . Highest education level: Not on file  Occupational History  . Not on file  Social Needs  . Financial resource strain: Not on file  . Food insecurity:    Worry: Not on file    Inability: Not on file  . Transportation needs:    Medical: Not on file    Non-medical: Not on file  Tobacco Use  . Smoking status: Current Every Day Smoker    Types: Cigarettes  . Smokeless tobacco: Never Used  Substance and Sexual Activity  . Alcohol use: No    Frequency: Never  . Drug use: No  . Sexual activity: Not on file  Lifestyle  . Physical activity:    Days per week: Not on file    Minutes per session: Not on file  . Stress: Not on file  Relationships  . Social connections:    Talks on phone: Not on file    Gets together: Not on file    Attends religious service: Not on file    Active member of club or organization: Not on file    Attends meetings of clubs or organizations: Not on file    Relationship status: Not on file  . Intimate partner violence:    Fear of current or ex partner: Not on file    Emotionally abused: Not on file    Physically abused: Not on file    Forced sexual activity: Not on file  Other Topics Concern  . Not on file  Social History  Narrative  . Not on file      Review of Systems  Constitutional: Negative for activity change, chills, fatigue and unexpected weight change.  HENT: Negative for congestion, postnasal drip, rhinorrhea, sneezing and sore throat.   Respiratory: Negative for cough, chest tightness and shortness of breath.   Cardiovascular: Negative for chest pain and palpitations.  Gastrointestinal: Negative for abdominal pain, constipation, diarrhea, nausea and vomiting.  Endocrine: Negative for cold intolerance, heat intolerance, polydipsia and polyuria.       Blood sugars doing well   Musculoskeletal: Negative for arthralgias, back pain, joint swelling and neck pain.  Skin: Negative for rash.  Allergic/Immunologic: Negative for environmental allergies, food allergies and immunocompromised state.  Neurological: Positive for headaches. Negative for tremors and numbness.  Hematological: Negative for adenopathy. Does not  bruise/bleed easily.  Psychiatric/Behavioral: Negative for behavioral problems (Depression), sleep disturbance and suicidal ideas. The patient is not nervous/anxious.     Today's Vitals   06/29/18 1131  BP: (!) 148/90  Pulse: 65  Resp: 16  SpO2: 94%  Weight: 280 lb (127 kg)  Height: 5\' 11"  (1.803 m)   Body mass index is 39.05 kg/m.  Physical Exam Vitals signs and nursing note reviewed.  Constitutional:      General: He is not in acute distress.    Appearance: Normal appearance. He is well-developed. He is not diaphoretic.  HENT:     Head: Normocephalic and atraumatic.     Nose: Nose normal.     Mouth/Throat:     Pharynx: No oropharyngeal exudate.  Eyes:     Pupils: Pupils are equal, round, and reactive to light.  Neck:     Musculoskeletal: Normal range of motion and neck supple.     Thyroid: No thyromegaly.     Vascular: No carotid bruit or JVD.     Trachea: No tracheal deviation.  Cardiovascular:     Rate and Rhythm: Normal rate and regular rhythm.     Heart sounds:  Normal heart sounds. No murmur. No friction rub. No gallop.   Pulmonary:     Effort: Pulmonary effort is normal. No respiratory distress.     Breath sounds: Normal breath sounds. No wheezing or rales.  Chest:     Chest wall: No tenderness.  Abdominal:     General: Bowel sounds are normal.     Palpations: Abdomen is soft.     Tenderness: There is no abdominal tenderness.  Musculoskeletal: Normal range of motion.  Lymphadenopathy:     Cervical: No cervical adenopathy.  Skin:    General: Skin is warm and dry.     Comments: .  Neurological:     Mental Status: He is alert and oriented to person, place, and time.     Cranial Nerves: No cranial nerve deficit.  Psychiatric:        Behavior: Behavior normal.        Thought Content: Thought content normal.        Judgment: Judgment normal.   Assessment/Plan: 1. Uncontrolled type 2 diabetes mellitus with hyperglycemia (HCC) - POCT HgB A1C 7.2 today. Continue diabetic medication as prescribed. Reviewed importance of following ADA diet to lowering blood sugars  2. Essential hypertension Stable. Continue bp medication as prescribed.   3. Mixed hyperlipidemia Lipid panel stable. Continue atorvastatin as prescribed   4. Cigarette nicotine dependence with nicotine-induced disorder Discussed importance of smoking cessation.   General Counseling: jullien granquist understanding of the findings of todays visit and agrees with plan of treatment. I have discussed any further diagnostic evaluation that may be needed or ordered today. We also reviewed his medications today. he has been encouraged to call the office with any questions or concerns that should arise related to todays visit.  Diabetes Counseling:  1. Addition of ACE inh/ ARB'S for nephroprotection. Microalbumin is updated  2. Diabetic foot care, prevention of complications. Podiatry consult 3. Exercise and lose weight.  4. Diabetic eye examination, Diabetic eye exam is updated  5.  Monitor blood sugar closlely. nutrition counseling.  6. Sign and symptoms of hypoglycemia including shaking sweating,confusion and headaches.  This patient was seen by Leretha Pol FNP Collaboration with Dr Lavera Guise as a part of collaborative care agreement  Orders Placed This Encounter  Procedures  . POCT HgB A1C  Time spent: [place] [facility]

## 2018-07-16 DIAGNOSIS — G4730 Sleep apnea, unspecified: Secondary | ICD-10-CM | POA: Diagnosis not present

## 2018-08-14 DIAGNOSIS — G4730 Sleep apnea, unspecified: Secondary | ICD-10-CM | POA: Diagnosis not present

## 2018-08-16 ENCOUNTER — Other Ambulatory Visit: Payer: Self-pay

## 2018-08-16 MED ORDER — GEMFIBROZIL 600 MG PO TABS: 600.0000 mg | ORAL_TABLET | Freq: Two times a day (BID) | ORAL | 1 refills | Status: DC

## 2018-08-16 MED ORDER — MELOXICAM 7.5 MG PO TABS: ORAL_TABLET | ORAL | 1 refills | Status: DC

## 2018-08-16 MED ORDER — METOPROLOL SUCCINATE ER 50 MG PO TB24: 50.0000 mg | ORAL_TABLET | Freq: Every day | ORAL | 1 refills | Status: DC

## 2018-08-16 MED ORDER — HYDROCHLOROTHIAZIDE 25 MG PO TABS: ORAL_TABLET | ORAL | 1 refills | Status: DC

## 2018-08-16 MED ORDER — OMEPRAZOLE 40 MG PO CPDR: 40.0000 mg | DELAYED_RELEASE_CAPSULE | Freq: Every day | ORAL | 1 refills | Status: DC

## 2018-08-16 MED ORDER — INSULIN GLARGINE (1 UNIT DIAL) 300 UNIT/ML ~~LOC~~ SOPN: 10.0000 [IU] | PEN_INJECTOR | Freq: Every evening | SUBCUTANEOUS | 1 refills | Status: DC

## 2018-08-16 MED ORDER — ATORVASTATIN CALCIUM 10 MG PO TABS: 10.0000 mg | ORAL_TABLET | Freq: Every day | ORAL | 1 refills | Status: DC

## 2018-09-13 ENCOUNTER — Other Ambulatory Visit: Payer: Self-pay

## 2018-09-13 MED ORDER — AMLODIPINE BESY-BENAZEPRIL HCL 5-20 MG PO CAPS: ORAL_CAPSULE | ORAL | 0 refills | Status: DC

## 2018-09-14 DIAGNOSIS — G4730 Sleep apnea, unspecified: Secondary | ICD-10-CM | POA: Diagnosis not present

## 2018-09-24 ENCOUNTER — Ambulatory Visit: Payer: Self-pay | Admitting: Adult Health

## 2018-09-28 ENCOUNTER — Other Ambulatory Visit: Payer: Self-pay

## 2018-09-28 MED ORDER — AMLODIPINE BESY-BENAZEPRIL HCL 5-20 MG PO CAPS: ORAL_CAPSULE | ORAL | 0 refills | Status: DC

## 2018-10-01 ENCOUNTER — Other Ambulatory Visit: Payer: Self-pay

## 2018-10-01 MED ORDER — GLYBURIDE-METFORMIN 5-500 MG PO TABS: ORAL_TABLET | ORAL | 0 refills | Status: DC

## 2018-11-08 ENCOUNTER — Ambulatory Visit: Payer: Self-pay | Admitting: Nurse Practitioner

## 2018-11-19 ENCOUNTER — Other Ambulatory Visit: Payer: Self-pay

## 2018-11-19 MED ORDER — GLUCOSE BLOOD VI STRP: ORAL_STRIP | 3 refills | Status: DC

## 2018-11-23 ENCOUNTER — Ambulatory Visit (INDEPENDENT_AMBULATORY_CARE_PROVIDER_SITE_OTHER): Payer: Medicare PPO | Admitting: Nurse Practitioner

## 2018-11-23 ENCOUNTER — Other Ambulatory Visit: Payer: Self-pay

## 2018-11-23 ENCOUNTER — Encounter: Payer: Self-pay | Admitting: Nurse Practitioner

## 2018-11-23 VITALS — BP 188/90 | HR 66 | Resp 16 | Ht 71.0 in | Wt 286.0 lb

## 2018-11-23 DIAGNOSIS — L209 Atopic dermatitis, unspecified: Secondary | ICD-10-CM

## 2018-11-23 DIAGNOSIS — Z0001 Encounter for general adult medical examination with abnormal findings: Secondary | ICD-10-CM

## 2018-11-23 DIAGNOSIS — R300 Dysuria: Secondary | ICD-10-CM | POA: Diagnosis not present

## 2018-11-23 DIAGNOSIS — E1165 Type 2 diabetes mellitus with hyperglycemia: Secondary | ICD-10-CM

## 2018-11-23 DIAGNOSIS — R0609 Other forms of dyspnea: Secondary | ICD-10-CM

## 2018-11-23 DIAGNOSIS — I10 Essential (primary) hypertension: Secondary | ICD-10-CM

## 2018-11-23 LAB — POCT GLYCOSYLATED HEMOGLOBIN (HGB A1C): Hemoglobin A1C: 7.8 % — AB (ref 4.0–5.6)

## 2018-11-23 MED ORDER — LISINOPRIL 5 MG PO TABS: 5.0000 mg | ORAL_TABLET | Freq: Every day | ORAL | 3 refills | Status: DC

## 2018-11-23 MED ORDER — TRIAMCINOLONE ACETONIDE 0.1 % EX OINT: 1.0000 "application " | TOPICAL_OINTMENT | Freq: Two times a day (BID) | CUTANEOUS | 2 refills | Status: DC

## 2018-11-23 MED ORDER — TOUJEO SOLOSTAR 300 UNIT/ML ~~LOC~~ SOPN: 15.0000 [IU] | PEN_INJECTOR | Freq: Every evening | SUBCUTANEOUS | 5 refills | Status: DC

## 2018-11-23 NOTE — Progress Notes (Signed)
Pt blood pressure elevated Taken twice  1st reading 191/94 2nd reading 188/90 Informed provider

## 2018-11-23 NOTE — Progress Notes (Signed)
[facility] [place],  [address]  Internal MEDICINE  Office Visit Note  Patient Name: Larry Callahan  [id number]  [id number]  Date of Service: 11/24/2018   Pt is here for routine health maintenance examination  Chief Complaint  Patient presents with  . Medicare Wellness    medication refills  . Hypertension  . Diabetes    A1C  . Rash    pt have a spot on his left leg that he is concerned about, itching     The patient is here for health maintenance exam. Blood pressure and blood sugars are both elevated. States that he gets short of breath, especially with exertion. Has some swelling in both feet. He also has rash which is very itchy and irritated on the left lower leg. She denies chest pain or chest pressure. HgbA1c is 7.8, up from 7.2  At his last check.     Current Medication: Outpatient Encounter Medications as of 11/23/2018  Medication Sig  . Alcohol Swabs (B-D SINGLE USE SWABS BUTTERFLY) PADS Use as directed twice a daily diag e11.65  . Alcohol Swabs PADS 1 each.  Marland Kitchen amLODipine-benazepril (LOTREL) 5-20 MG capsule TAKE  (1)  CAPSULE  TWICE DAILY.  Marland Kitchen aspirin EC 81 MG tablet Take 81 mg by mouth daily.  Marland Kitchen atorvastatin (LIPITOR) 10 MG tablet Take 1 tablet (10 mg total) by mouth daily.  . Blood Glucose Calibration (TRUE METRIX LEVEL 1) Low SOLN Use as directed  . Blood Glucose Monitoring Suppl (TRUE METRIX AIR GLUCOSE METER) DEVI 1 Device by Does not apply route 2 (two) times daily.  Marland Kitchen gemfibrozil (LOPID) 600 MG tablet Take 1 tablet (600 mg total) by mouth 2 (two) times daily with a meal.  . glucose blood (TRUE METRIX BLOOD GLUCOSE TEST) test strip Use as instructed twice daily diag E11.65  . glyBURIDE-metformin (GLUCOVANCE) 5-500 MG tablet 2 with breakfast and 2 with dinner  . hydrochlorothiazide (HYDRODIURIL) 25 MG tablet TAKE 1 TABLET EVERY MORNING FOR BLOOD PRESSURE  . meloxicam (MOBIC) 7.5 MG tablet TAKE 1 TABLET TWICE DAILY AS  NEEDED FOR  JOINT  PAIN  . metoprolol succinate (TOPROL-XL) 50 MG 24 hr tablet Take 1 tablet (50 mg total) by mouth at bedtime. Take with or immediately following a meal.  . NOVOFINE 32G X 6 MM MISC To use with [place] injections daily  . omeprazole (PRILOSEC) 40 MG capsule Take 1 capsule (40 mg total) by mouth daily.  . TRUEPLUS LANCETS 33G MISC Use as directed twice daily diag E11.65  . [DISCONTINUED] triamcinolone (KENALOG) 0.025 % cream Apply 1 application topically 2 (two) times daily.  . Insulin Glargine, 1 Unit Dial, (TOUJEO SOLOSTAR) 300 UNIT/ML SOPN Inject 15 Units into the skin every evening.  Marland Kitchen lisinopril (ZESTRIL) 5 MG tablet Take 1 tablet (5 mg total) by mouth daily.  Marland Kitchen triamcinolone ointment (KENALOG) 0.1 % Apply 1 application topically 2 (two) times daily.  . [DISCONTINUED] Insulin Glargine, 1 Unit Dial, (TOUJEO SOLOSTAR) 300 UNIT/ML SOPN Inject 10 Units into the skin every evening.   No facility-administered encounter medications on file as of 11/23/2018.     Surgical History: Past Surgical History:  Procedure Laterality Date  . CHOLECYSTECTOMY    . left arm surgery    . nephrectomy Left   . PARATHYROIDECTOMY      Medical History: Past Medical History:  Diagnosis Date  . Allergy   . Coronary artery disease   . Diabetes mellitus without complication ([place])   .  GERD (gastroesophageal reflux disease)   . Hyperlipidemia   . Hypertension   . Sleep apnea     Family History: Family History  Problem Relation Age of Onset  . Diabetes Mother   . Lung cancer Father   . Diabetes Sister   . Hypertension Sister   . Heart disease Sister   . Cancer Sister   . Bone cancer Brother       Review of Systems  Constitutional: Positive for fatigue. Negative for activity change, chills and unexpected weight change.  HENT: Negative for congestion, postnasal drip, rhinorrhea, sneezing and sore throat.   Respiratory: Positive for shortness of breath. Negative for cough and chest  tightness.        Shortness of breath with exertion.   Cardiovascular: Negative for chest pain and palpitations.       Blood pressure elevated   Gastrointestinal: Negative for abdominal pain, constipation, diarrhea, nausea and vomiting.  Endocrine: Negative for cold intolerance, heat intolerance, polydipsia and polyuria.       Blood sugars have been elevated.   Musculoskeletal: Negative for arthralgias, back pain, joint swelling and neck pain.  Skin: Negative for rash.  Allergic/Immunologic: Negative for environmental allergies, food allergies and immunocompromised state.  Neurological: Positive for headaches. Negative for tremors and numbness.  Hematological: Negative for adenopathy. Does not bruise/bleed easily.  Psychiatric/Behavioral: Negative for behavioral problems (Depression), sleep disturbance and suicidal ideas. The patient is not nervous/anxious.     Today's Vitals   11/23/18 1054  BP: (!) 188/90  Pulse: 66  Resp: 16  SpO2: 96%  Weight: 286 lb (129.7 kg)   Body mass index is 39.89 kg/m.   Physical Exam Vitals signs and nursing note reviewed.  Constitutional:      General: He is not in acute distress.    Appearance: Normal appearance. He is well-developed. He is not diaphoretic.  HENT:     Head: Normocephalic and atraumatic.     Nose: Nose normal.     Mouth/Throat:     Pharynx: No oropharyngeal exudate.  Eyes:     Extraocular Movements: Extraocular movements intact.     Conjunctiva/sclera: Conjunctivae normal.     Pupils: Pupils are equal, round, and reactive to light.  Neck:     Musculoskeletal: Normal range of motion and neck supple.     Thyroid: No thyromegaly.     Vascular: No carotid bruit or JVD.     Trachea: No tracheal deviation.  Cardiovascular:     Rate and Rhythm: Normal rate and regular rhythm.     Pulses: Normal pulses.          Dorsalis pedis pulses are 2+ on the right side and 2+ on the left side.       Posterior tibial pulses are 2+ on the  right side and 2+ on the left side.     Heart sounds: Normal heart sounds. No murmur. No friction rub. No gallop.   Pulmonary:     Effort: Pulmonary effort is normal. No respiratory distress.     Breath sounds: Normal breath sounds. No wheezing or rales.  Chest:     Chest wall: No tenderness.  Abdominal:     General: Bowel sounds are normal.     Palpations: Abdomen is soft.     Tenderness: There is no abdominal tenderness.  Musculoskeletal: Normal range of motion.     Right foot: Normal range of motion. No deformity.     Left foot: Normal range of motion. No  deformity.  Feet:     Right foot:     Protective Sensation: 10 sites tested. 10 sites sensed.     Skin integrity: Skin integrity normal. No erythema.     Toenail Condition: Right toenails are abnormally thick.     Left foot:     Protective Sensation: 10 sites tested. 10 sites sensed.     Skin integrity: Skin integrity normal. No erythema.     Toenail Condition: Left toenails are abnormally thick.  Lymphadenopathy:     Cervical: No cervical adenopathy.  Skin:    General: Skin is warm and dry.     Capillary Refill: Capillary refill takes less than 2 seconds.     Comments: .  Neurological:     Mental Status: He is alert and oriented to person, place, and time. Mental status is at baseline.     Cranial Nerves: No cranial nerve deficit.  Psychiatric:        Behavior: Behavior normal.        Thought Content: Thought content normal.        Judgment: Judgment normal.    Depression screen [facility] 2/9 11/23/2018 06/29/2018 02/02/2018 10/23/2017 08/24/2017  Decreased Interest 0 0 0 0 0  Down, Depressed, Hopeless 0 0 0 0 0  PHQ - 2 Score 0 0 0 0 0    Functional Status Survey: Is the patient deaf or have difficulty hearing?: No Does the patient have difficulty seeing, even when wearing glasses/contacts?: No Does the patient have difficulty concentrating, remembering, or making decisions?: Yes(remembering some things) Does the patient  have difficulty walking or climbing stairs?: Yes(get short of breath) Does the patient have difficulty dressing or bathing?: No Does the patient have difficulty doing errands alone such as visiting a doctor's office or shopping?: No  MMSE - [place] Exam 11/23/2018 10/23/2017  Orientation to time 5 5  Orientation to Place 5 5  Registration 3 3  Attention/ Calculation 5 5  Recall 3 3  Language- name 2 objects 2 2  Language- repeat 1 1  Language- follow 3 step command 3 3  Language- read & follow direction 1 1  Write a sentence 1 1  Copy design 1 1  Total score 30 30    Fall Risk  11/23/2018 06/29/2018 02/02/2018 10/23/2017 08/24/2017  Falls in the past year? 0 1 No Yes Yes  Number falls in past yr: - 0 - 1 1  Injury with Fall? - 0 - No No      LABS: Recent Results (from the past 2160 hour(s))  POCT HgB A1C     Status: Abnormal   Collection Time: 11/23/18 11:19 AM  Result Value Ref Range   Hemoglobin A1C 7.8 (A) 4.0 - 5.6 %   HbA1c POC (<> result, manual entry)     HbA1c, POC (prediabetic range)     HbA1c, POC (controlled diabetic range)     Assessment/Plan: 1. Encounter for general adult medical examination with abnormal findings Annual health maintenance exam today.   2. Uncontrolled type 2 diabetes mellitus with hyperglycemia (HCC) - POCT HgB A1C 7.8 today. Advised him to slowly increase toujeo to 15units every day. conitnue other diabetic medication as prescribed. Monitor blood sugars closely.  - Insulin Glargine, 1 Unit Dial, (TOUJEO SOLOSTAR) 300 UNIT/ML SOPN; Inject 15 Units into the skin every evening.  Dispense: 3 pen; Refill: 5  3. Essential hypertension Add lisinopril 5mg  every day. Continue other bp medication as prescribed.  - lisinopril (ZESTRIL)  5 MG tablet; Take 1 tablet (5 mg total) by mouth daily.  Dispense: 30 tablet; Refill: 3  4. Dyspnea on exertion Will get echocardiogram for further evaluation. - ECHOCARDIOGRAM COMPLETE; Future  5. Atopic  dermatitis, unspecified type Increase strength of triamcinolone to 0.1% ointment. Apply twice daily as needed. - triamcinolone ointment (KENALOG) 0.1 %; Apply 1 application topically 2 (two) times daily.  Dispense: 80 g; Refill: 2  6. Dysuria - UA/M w/rflx Culture, Routine  General Counseling: tavian callander understanding of the findings of todays visit and agrees with plan of treatment. I have discussed any further diagnostic evaluation that may be needed or ordered today. We also reviewed his medications today. he has been encouraged to call the office with any questions or concerns that should arise related to todays visit.    Counseling:  Diabetes Counseling:  1. Addition of ACE inh/ ARB'S for nephroprotection. Microalbumin is updated  2. Diabetic foot care, prevention of complications. Podiatry consult 3. Exercise and lose weight.  4. Diabetic eye examination, Diabetic eye exam is updated  5. Monitor blood sugar closlely. nutrition counseling.  6. Sign and symptoms of hypoglycemia including shaking sweating,confusion and headaches.  This patient was seen by Leretha Pol FNP Collaboration with Dr Lavera Guise as a part of collaborative care agreement  Orders Placed This Encounter  Procedures  . UA/M w/rflx Culture, Routine  . POCT HgB A1C  . ECHOCARDIOGRAM COMPLETE    Meds ordered this encounter  Medications  . lisinopril (ZESTRIL) 5 MG tablet    Sig: Take 1 tablet (5 mg total) by mouth daily.    Dispense:  30 tablet    Refill:  3    Order Specific Question:   Supervising Provider    Answer:   Lavera Guise [[id number]]  . triamcinolone ointment (KENALOG) 0.1 %    Sig: Apply 1 application topically 2 (two) times daily.    Dispense:  80 g    Refill:  2    Order Specific Question:   Supervising Provider    Answer:   Lavera Guise [[id number]]  . Insulin Glargine, 1 Unit Dial, (TOUJEO SOLOSTAR) 300 UNIT/ML SOPN    Sig: Inject 15 Units into the skin every evening.    Dispense:   3 pen    Refill:  5    Updating dose    Order Specific Question:   Supervising Provider    Answer:   Lavera Guise [[id number]]    Time spent: [place], [place]  Internal Medicine

## 2018-11-24 DIAGNOSIS — L209 Atopic dermatitis, unspecified: Secondary | ICD-10-CM | POA: Insufficient documentation

## 2018-11-25 LAB — MICROSCOPIC EXAMINATION

## 2018-12-01 LAB — UA/M W/RFLX CULTURE, ROUTINE
Bilirubin, UA: NEGATIVE
Glucose, UA: NEGATIVE
Ketones, UA: NEGATIVE
Leukocytes,UA: NEGATIVE
Nitrite, UA: NEGATIVE
RBC, UA: NEGATIVE
Specific Gravity, UA: 1.017 (ref 1.005–1.030)
Urobilinogen, Ur: 0.2 mg/dL (ref 0.2–1.0)
pH, UA: 5.5 (ref 5.0–7.5)

## 2018-12-01 LAB — MICROSCOPIC EXAMINATION
Casts: NONE SEEN /lpf
Epithelial Cells (non renal): NONE SEEN /hpf (ref 0–10)
RBC, Urine: NONE SEEN /hpf (ref 0–2)

## 2018-12-01 LAB — URINE CULTURE, REFLEX

## 2018-12-06 ENCOUNTER — Ambulatory Visit: Payer: Self-pay | Admitting: Internal Medicine

## 2018-12-13 ENCOUNTER — Other Ambulatory Visit: Payer: Self-pay

## 2018-12-13 DIAGNOSIS — E1165 Type 2 diabetes mellitus with hyperglycemia: Secondary | ICD-10-CM

## 2018-12-13 MED ORDER — NOVOFINE 32G X 6 MM MISC: 4 refills | Status: DC

## 2018-12-21 ENCOUNTER — Other Ambulatory Visit: Payer: Self-pay

## 2018-12-21 ENCOUNTER — Ambulatory Visit: Payer: Medicare PPO

## 2018-12-21 DIAGNOSIS — R0602 Shortness of breath: Secondary | ICD-10-CM | POA: Diagnosis not present

## 2018-12-21 DIAGNOSIS — R0609 Other forms of dyspnea: Secondary | ICD-10-CM

## 2018-12-27 ENCOUNTER — Ambulatory Visit: Payer: Self-pay | Admitting: Internal Medicine

## 2018-12-28 ENCOUNTER — Ambulatory Visit: Payer: Self-pay | Admitting: Nurse Practitioner

## 2018-12-29 ENCOUNTER — Emergency Department
Admission: EM | Admit: 2018-12-29 | Discharge: 2018-12-29 | Disposition: A | Discharge status: Home / Self Care | Payer: Medicare PPO | Attending: Emergency Medicine | Admitting: Emergency Medicine

## 2018-12-29 ENCOUNTER — Emergency Department: Payer: Medicare PPO

## 2018-12-29 ENCOUNTER — Encounter: Payer: Self-pay | Admitting: Emergency Medicine

## 2018-12-29 ENCOUNTER — Other Ambulatory Visit: Payer: Self-pay

## 2018-12-29 DIAGNOSIS — R5381 Other malaise: Secondary | ICD-10-CM | POA: Diagnosis not present

## 2018-12-29 DIAGNOSIS — F17210 Nicotine dependence, cigarettes, uncomplicated: Secondary | ICD-10-CM | POA: Insufficient documentation

## 2018-12-29 DIAGNOSIS — Z79899 Other long term (current) drug therapy: Secondary | ICD-10-CM | POA: Insufficient documentation

## 2018-12-29 DIAGNOSIS — E785 Hyperlipidemia, unspecified: Secondary | ICD-10-CM | POA: Diagnosis not present

## 2018-12-29 DIAGNOSIS — R05 Cough: Secondary | ICD-10-CM | POA: Diagnosis not present

## 2018-12-29 DIAGNOSIS — I2510 Atherosclerotic heart disease of native coronary artery without angina pectoris: Secondary | ICD-10-CM | POA: Insufficient documentation

## 2018-12-29 DIAGNOSIS — I10 Essential (primary) hypertension: Secondary | ICD-10-CM | POA: Insufficient documentation

## 2018-12-29 DIAGNOSIS — R0602 Shortness of breath: Secondary | ICD-10-CM | POA: Diagnosis not present

## 2018-12-29 LAB — BASIC METABOLIC PANEL
Anion gap: 7 (ref 5–15)
BUN: 26 mg/dL — ABNORMAL HIGH (ref 8–23)
CO2: 24 mmol/L (ref 22–32)
Calcium: 9.2 mg/dL (ref 8.9–10.3)
Chloride: 111 mmol/L (ref 98–111)
Creatinine, Ser: 1.63 mg/dL — ABNORMAL HIGH (ref 0.61–1.24)
GFR calc Af Amer: 50 mL/min — ABNORMAL LOW (ref >60)
GFR calc non Af Amer: 44 mL/min — ABNORMAL LOW (ref >60)
Glucose, Bld: 161 mg/dL — ABNORMAL HIGH (ref 70–99)
Potassium: 4.9 mmol/L (ref 3.5–5.1)
Sodium: 142 mmol/L (ref 135–145)

## 2018-12-29 LAB — CBC WITH DIFFERENTIAL/PLATELET
Abs Immature Granulocytes: 0.05 10*3/uL (ref 0.00–0.07)
Basophils Absolute: 0.1 10*3/uL (ref 0.0–0.1)
Basophils Relative: 1 %
Eosinophils Absolute: 0.2 10*3/uL (ref 0.0–0.5)
Eosinophils Relative: 3 %
HCT: 32.5 % — ABNORMAL LOW (ref 39.0–52.0)
Hemoglobin: 9.6 g/dL — ABNORMAL LOW (ref 13.0–17.0)
Immature Granulocytes: 1 %
Lymphocytes Relative: 16 %
Lymphs Abs: 1.1 10*3/uL (ref 0.7–4.0)
MCH: 24.9 pg — ABNORMAL LOW (ref 26.0–34.0)
MCHC: 29.5 g/dL — ABNORMAL LOW (ref 30.0–36.0)
MCV: 84.2 fL (ref 80.0–100.0)
Monocytes Absolute: 0.5 10*3/uL (ref 0.1–1.0)
Monocytes Relative: 7 %
Neutro Abs: 5 10*3/uL (ref 1.7–7.7)
Neutrophils Relative %: 72 %
Platelets: 249 10*3/uL (ref 150–400)
RBC: 3.86 MIL/uL — ABNORMAL LOW (ref 4.22–5.81)
RDW: 15.6 % — ABNORMAL HIGH (ref 11.5–15.5)
WBC: 7 10*3/uL (ref 4.0–10.5)
nRBC: 0 % (ref 0.0–0.2)

## 2018-12-29 LAB — TROPONIN I (HIGH SENSITIVITY)
Troponin I (High Sensitivity): 11 ng/L (ref <18)
Troponin I (High Sensitivity): 10 ng/L (ref <18)

## 2018-12-29 MED ORDER — SODIUM CHLORIDE 0.9 % IV BOLUS
500.0000 mL | Freq: Once | INTRAVENOUS | Status: AC
  Administered 2018-12-29: 500 mL via INTRAVENOUS

## 2018-12-29 MED ORDER — ASPIRIN 81 MG PO CHEW
324.0000 mg | CHEWABLE_TABLET | Freq: Once | ORAL | Status: AC
  Administered 2018-12-29: 08:00:00 324 mg via ORAL
  Filled 2018-12-29: qty 4

## 2018-12-29 NOTE — ED Notes (Signed)
This rn provided water dn graham crackers to pt per pts' request.

## 2018-12-29 NOTE — ED Notes (Signed)
ED Provider, Joni Fears at bedside.

## 2018-12-29 NOTE — Discharge Instructions (Addendum)
Your chest x-ray and lab work was all okay today.  Please follow-up with your primary care doctor and cardiology this week for further assessment of your symptoms.

## 2018-12-29 NOTE — ED Triage Notes (Signed)
Patient presents to Emergency Department via [facility] from HOME with complaints of SOB for 2-3 days, worse with exertion.     History of daily smoker and HTN  No apparent breathing distress att

## 2018-12-29 NOTE — ED Provider Notes (Signed)
[facility] Emergency Department Provider Note  ____________________________________________  Time seen: Approximately 9:13 AM  I have reviewed the triage vital signs and the nursing notes.   HISTORY  Chief Complaint Shortness of Breath    HPI Larry Callahan is a 63 y.o. male with a history of CAD diabetes GERD hypertension and morbid obesity who comes to the ED complaining of shortness of breath for the past month.  Worse with walking, better sitting down.  No orthopnea or PND.  Denies peripheral edema.  Denies associated chest pain diaphoresis vomiting.  No cough fever chills or body aches.  No sick contacts.      Past Medical History:  Diagnosis Date  . Allergy   . Coronary artery disease   . Diabetes mellitus without complication ([place])   . GERD (gastroesophageal reflux disease)   . Hyperlipidemia   . Hypertension   . Sleep apnea      Patient Active Problem List   Diagnosis Date Noted  . Atopic dermatitis 11/24/2018  . Encounter for general adult medical examination with abnormal findings 11/06/2017  . Uncontrolled type 2 diabetes mellitus with hyperglycemia ([place]) 11/06/2017  . Dermatitis, unspecified 06/09/2017  . Atherosclerotic heart disease of native coronary artery without angina pectoris 06/09/2017  . 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 Obstructive sleep apnea, adult 06/09/2017  . Morbid (severe) obesity due to excess calories ([place]) 06/09/2017  . Cigarette nicotine dependence with nicotine-induced disorder 06/09/2017  . Type 2 diabetes mellitus with diabetic polyneuropathy ([place]) 06/09/2017  . Allergic rhinitis due to pollen 06/09/2017  . Mixed hyperlipidemia 06/09/2017  . Chronic obstructive pulmonary disease, unspecified ([place]) 06/09/2017  . Dyspnea on exertion 06/09/2017  . Wheezing 06/09/2017  . Dysuria 06/09/2017  . Snoring 06/09/2017  . Essential hypertension 06/09/2017  . Personal  history of other malignant neoplasm of kidney 06/09/2017  . Pain in right hip 06/09/2017  . Impacted cerumen, bilateral 06/09/2017  . Secondary hyperparathyroidism, not elsewhere classified ([place]) 06/09/2017  . Type 2 diabetes mellitus with hyperglycemia ([place]) 06/09/2017  . Tinea corporis 06/09/2017     Past Surgical History:  Procedure Laterality Date  . CHOLECYSTECTOMY    . left arm surgery    . nephrectomy Left   . PARATHYROIDECTOMY       Prior to Admission medications   Medication Sig Start Date End Date Taking? Authorizing Provider  Alcohol Swabs (B-D SINGLE USE SWABS BUTTERFLY) PADS Use as directed twice a daily diag e11.65 04/04/18   [name], NP  Alcohol Swabs PADS 1 each. 04/04/18   [provider]  amLODipine-benazepril (LOTREL) 5-20 MG capsule TAKE  (1)  CAPSULE  TWICE DAILY. 09/28/18   [name], NP  aspirin EC 81 MG tablet Take 81 mg by mouth daily.    [provider]  atorvastatin (LIPITOR) 10 MG tablet Take 1 tablet (10 mg total) by mouth daily. 08/16/18   [name], NP  Blood Glucose Calibration (TRUE METRIX LEVEL 1) Low SOLN Use as directed 04/04/18   [name], NP  Blood Glucose Monitoring Suppl (TRUE METRIX AIR GLUCOSE METER) DEVI 1 Device by Does not apply route 2 (two) times daily. 04/13/18   [name], NP  gemfibrozil (LOPID) 600 MG tablet Take 1 tablet (600 mg total) by mouth 2 (two) times daily with a meal. 08/16/18   Boscia, Heather E, NP  glucose blood (TRUE METRIX BLOOD GLUCOSE TEST) test strip Use as instructed twice  daily diag E11.65 11/19/18   [name], NP  glyBURIDE-metformin (GLUCOVANCE) 5-500 MG tablet 2 with breakfast and 2 with dinner 10/01/18   [name], NP  hydrochlorothiazide (HYDRODIURIL) 25 MG tablet TAKE 1 TABLET EVERY MORNING FOR BLOOD PRESSURE 08/16/18   [name], NP  Insulin Glargine, 1 Unit Dial, (TOUJEO SOLOSTAR) 300 UNIT/ML SOPN Inject 15 Units into the skin every  evening. 11/23/18 02/21/19  [name], NP  lisinopril (ZESTRIL) 5 MG tablet Take 1 tablet (5 mg total) by mouth daily. 11/23/18   [name], NP  meloxicam (MOBIC) 7.5 MG tablet TAKE 1 TABLET TWICE DAILY AS NEEDED FOR  JOINT  PAIN 08/16/18   [name], NP  metoprolol succinate (TOPROL-XL) 50 MG 24 hr tablet Take 1 tablet (50 mg total) by mouth at bedtime. Take with or immediately following a meal. 08/16/18   [name], NP  NOVOFINE 32G X 6 MM MISC To use with [place] injections daily 12/13/18   [name], NP  omeprazole (PRILOSEC) 40 MG capsule Take 1 capsule (40 mg total) by mouth daily. 08/16/18   [name], NP  triamcinolone ointment (KENALOG) 0.1 % Apply 1 application topically 2 (two) times daily. 11/23/18   [name], NP  TRUEPLUS LANCETS 33G MISC Use as directed twice daily diag E11.65 04/04/18   [name], NP     Allergies Penicillins   Family History  Problem Relation Age of Onset  . Diabetes Mother   . Lung cancer Father   . Diabetes Sister   . Hypertension Sister   . Heart disease Sister   . Cancer Sister   . Bone cancer Brother     Social History Social History   Tobacco Use  . Smoking status: Current Every Day Smoker    Types: Cigarettes  . Smokeless tobacco: Never Used  Substance Use Topics  . Alcohol use: No    Frequency: Never  . Drug use: No    Review of Systems  Constitutional:   No fever or chills.  ENT:   No sore throat. No rhinorrhea. Cardiovascular:   No chest pain or syncope. Respiratory:   Positive chronic shortness of breath as above without cough. Gastrointestinal:   Negative for abdominal pain, vomiting and diarrhea.  Musculoskeletal:   Negative for focal pain or swelling All other systems reviewed and are negative except as documented above in ROS and HPI.  ____________________________________________   PHYSICAL EXAM:  VITAL SIGNS: ED Triage Vitals  Enc Vitals Group     BP 12/29/18 0651  (!) 155/95     Pulse Rate 12/29/18 0651 69     Resp 12/29/18 0651 18     Temp 12/29/18 0651 98.3 F (36.8 C)     Temp Source 12/29/18 0651 Oral     SpO2 12/29/18 0651 92 %     Weight 12/29/18 0652 280 lb (127 kg)     Height 12/29/18 0652 5\' 11"  (1.803 m)     Head Circumference --      Peak Flow --      Pain Score 12/29/18 0652 0     Pain Loc --      Pain Edu? --      Excl. in [place]? --     Vital signs reviewed, nursing assessments reviewed.   Constitutional:   Alert and oriented. Non-toxic appearance.  Morbidly obese Eyes:   Conjunctivae are normal. EOMI. PERRL. ENT  Head:   Normocephalic and atraumatic.      Nose:   No congestion/rhinnorhea.       Mouth/Throat:   MMM, no pharyngeal erythema. No peritonsillar mass.       Neck:   No meningismus. Full ROM. Hematological/Lymphatic/Immunilogical:   No cervical lymphadenopathy. Cardiovascular:   RRR. Symmetric bilateral radial and DP pulses.  No murmurs. Cap refill less than 2 seconds. Respiratory:   Normal respiratory effort without tachypnea/retractions. Breath sounds are clear and equal bilaterally. No wheezes/rales/rhonchi. Gastrointestinal:   Soft and nontender. Non distended. There is no CVA tenderness.  No rebound, rigidity, or guarding.  Musculoskeletal:   Normal range of motion in all extremities. No joint effusions.  No lower extremity tenderness.  No edema. Neurologic:   Normal speech and language.  Motor grossly intact. No acute focal neurologic deficits are appreciated.  Skin:    Skin is warm, dry and intact. No rash noted.  No petechiae, purpura, or bullae.  ____________________________________________    LABS (pertinent positives/negatives) (all labs ordered are listed, but only abnormal results are displayed) Labs Reviewed  BASIC METABOLIC PANEL - Abnormal; Notable for the following components:      Result Value   Glucose, Bld 161 (*)    BUN 26 (*)    Creatinine, Ser 1.63 (*)    GFR calc non Af Amer 44  (*)    GFR calc Af Amer 50 (*)    All other components within normal limits  CBC WITH DIFFERENTIAL/PLATELET - Abnormal; Notable for the following components:   RBC 3.86 (*)    Hemoglobin 9.6 (*)    HCT 32.5 (*)    MCH 24.9 (*)    MCHC 29.5 (*)    RDW 15.6 (*)    All other components within normal limits  TROPONIN I (HIGH SENSITIVITY)  TROPONIN I (HIGH SENSITIVITY)   ____________________________________________   EKG  Interpreted by me Sinus rhythm rate of 67, normal axis intervals QRS ST segments and T waves.  Overall normal EKG.  ____________________________________________    VPXTGGYIR  Dg Chest Portable 1 View  Result Date: 12/29/2018 CLINICAL DATA:  Increasing shortness of breath, worse with exertion. EXAM: PORTABLE CHEST 1 VIEW COMPARISON:  Chest x-rays dated 03/06/2013 and 08/10/2011. FINDINGS: Heart size and mediastinal contours are stable. Lungs are clear. No pleural effusion or pneumothorax seen. Osseous structures about the chest are unremarkable. IMPRESSION: No active disease. No evidence of pneumonia or pulmonary edema. Electronically Signed   By: Franki Cabot M.D.   On: 12/29/2018 07:49    ____________________________________________   PROCEDURES Procedures  ____________________________________________  DIFFERENTIAL DIAGNOSIS   Non-STEMI, obstructive sleep apnea, chronic lung disease, obesity, pneumonia, pulmonary edema  CLINICAL IMPRESSION / ASSESSMENT AND PLAN / ED COURSE  Medications ordered in the ED: Medications  aspirin chewable tablet 324 mg (324 mg Oral Given 12/29/18 0743)  sodium chloride 0.9 % bolus 500 mL (500 mLs Intravenous New Bag/Given 12/29/18 4854)    Pertinent labs & imaging results that were available during my care of the patient were reviewed by me and considered in my medical decision making (see chart for details).  Larry Callahan was evaluated in Emergency Department on 12/29/2018 for the symptoms described in the  history of present illness. He was evaluated in the context of the global COVID-19 pandemic, which necessitated consideration that the patient might be at risk for infection with the SARS-CoV-2 virus that causes COVID-19. Institutional protocols and algorithms that pertain to the evaluation of patients at  risk for COVID-19 are in a state of rapid change based on information released by regulatory bodies including the CDC and federal and state organizations. These policies and algorithms were followed during the patient's care in the ED.   Patient presents with chronic shortness of breath, which seems to be worse in the last few days.  No specific focal complaints.Considering the patient's symptoms, medical history, and physical examination today, I have low suspicion for ACS, PE, TAD, pneumothorax, carditis, mediastinitis, pneumonia, CHF, or sepsis.  Vital signs and exam are all okay.  EKG and chest x-ray are okay.  I will get a delta troponin given his underlying obesity and other comorbidities.  If unremarkable would plan to refer to outpatient cardiology follow-up.  Clinical Course as of Dec 28 1108  Sat Dec 29, 2018  0916 Patient notes he has an appointment scheduled with his primary care doctor in 2 days to review results of recent EKG.  Our EKG is unremarkable.  Advised him he can also talk about cardiology referral with his primary care doctor when he sees them.   [PS]    Clinical Course User Index [PS] Carrie Mew, MD     ----------------------------------------- 11:10 AM on 12/29/2018 -----------------------------------------  Delta troponin negative.  Work-up reassuring.  Stable for outpatient follow-up for these subacute to chronic symptoms..  ____________________________________________   FINAL CLINICAL IMPRESSION(S) / ED DIAGNOSES    Final diagnoses:  Shortness of breath     ED Discharge Orders    None      Portions of this note were generated with dragon  dictation software. Dictation errors may occur despite best attempts at proofreading.   Carrie Mew, MD 12/29/18 1110

## 2018-12-29 NOTE — ED Notes (Signed)
Pt denies Cp/dizziness at this time.

## 2018-12-29 NOTE — ED Notes (Signed)
Pt asked to speak to [place]. This Rn has notified MD Joni Fears.

## 2018-12-29 NOTE — ED Notes (Signed)
Pt given a urinal. Pt asks for privacy while using urinal. Pt ws able to stand with a steady gait

## 2018-12-31 ENCOUNTER — Other Ambulatory Visit: Payer: Self-pay

## 2018-12-31 ENCOUNTER — Encounter: Payer: Self-pay | Admitting: Nurse Practitioner

## 2018-12-31 ENCOUNTER — Ambulatory Visit (INDEPENDENT_AMBULATORY_CARE_PROVIDER_SITE_OTHER): Payer: Medicare PPO | Admitting: Nurse Practitioner

## 2018-12-31 VITALS — BP 152/80 | HR 69 | Resp 16 | Ht 71.0 in | Wt 287.6 lb

## 2018-12-31 DIAGNOSIS — F17219 Nicotine dependence, cigarettes, with unspecified nicotine-induced disorders: Secondary | ICD-10-CM | POA: Diagnosis not present

## 2018-12-31 DIAGNOSIS — L209 Atopic dermatitis, unspecified: Secondary | ICD-10-CM | POA: Diagnosis not present

## 2018-12-31 DIAGNOSIS — I10 Essential (primary) hypertension: Secondary | ICD-10-CM | POA: Diagnosis not present

## 2018-12-31 DIAGNOSIS — Z1211 Encounter for screening for malignant neoplasm of colon: Secondary | ICD-10-CM

## 2018-12-31 DIAGNOSIS — R0609 Other forms of dyspnea: Secondary | ICD-10-CM | POA: Diagnosis not present

## 2018-12-31 DIAGNOSIS — D509 Iron deficiency anemia, unspecified: Secondary | ICD-10-CM

## 2018-12-31 DIAGNOSIS — I5189 Other ill-defined heart diseases: Secondary | ICD-10-CM | POA: Diagnosis not present

## 2018-12-31 MED ORDER — FERROUS GLUCONATE 324 (38 FE) MG PO TABS: 324.0000 mg | ORAL_TABLET | Freq: Every day | ORAL | 3 refills | Status: DC

## 2018-12-31 MED ORDER — CLOBETASOL PROPIONATE 0.05 % EX CREA: 1.0000 "application " | TOPICAL_CREAM | Freq: Two times a day (BID) | CUTANEOUS | 1 refills | Status: DC

## 2018-12-31 MED ORDER — NICOTINE 21 MG/24HR TD PT24: 21.0000 mg | MEDICATED_PATCH | Freq: Every day | TRANSDERMAL | 0 refills | Status: DC

## 2018-12-31 MED ORDER — HYDRALAZINE HCL 10 MG PO TABS: 10.0000 mg | ORAL_TABLET | Freq: Three times a day (TID) | ORAL | 3 refills | Status: DC

## 2018-12-31 NOTE — Progress Notes (Signed)
[facility] [place], [place] [address]  Internal MEDICINE  Office Visit Note  Patient Name: Larry Callahan  [id number]  [id number]  Date of Service: 12/31/2018  Chief Complaint  Patient presents with  . Follow-up    echo results  . Medication Management    poss use of nicotine patches    The patient is here for follow up visit. He is having shortness of breath, especially with exertion. Blood pressure is moderately elevated as well. He had to be seen in ER on Saturday as his shortness of breath was so severe. Troponin levels were normal, however, he was anemic. Hgb and Hct were lower than they were back in September. He was referred to see cardiologist who he is not familiar with and would like referral to be with Dr. Rockey Situ. He did have echocardiogram done since his last visit here. This showed normal LVF and normal valves. He has grade II diastolic dysfunction. The patient states that he would like a prescription for smoking patches. He states that he smokes "too much" and this is contributing to his shortness of breath. He states that he is ready to quit.       Current Medication: Outpatient Encounter Medications as of 12/31/2018  Medication Sig Note  . Alcohol Swabs (B-D SINGLE USE SWABS BUTTERFLY) PADS Use as directed twice a daily diag e11.65   . Alcohol Swabs PADS 1 each.   Marland Kitchen amLODipine-benazepril (LOTREL) 5-20 MG capsule TAKE  (1)  CAPSULE  TWICE DAILY.   Marland Kitchen aspirin EC 81 MG tablet Take 81 mg by mouth daily.   Marland Kitchen atorvastatin (LIPITOR) 10 MG tablet Take 1 tablet (10 mg total) by mouth daily.   . Blood Glucose Calibration (TRUE METRIX LEVEL 1) Low SOLN Use as directed   . Blood Glucose Monitoring Suppl (TRUE METRIX AIR GLUCOSE METER) DEVI 1 Device by Does not apply route 2 (two) times daily.   Marland Kitchen gemfibrozil (LOPID) 600 MG tablet Take 1 tablet (600 mg total) by mouth 2 (two) times daily with a meal.   . glucose blood (TRUE METRIX BLOOD GLUCOSE  TEST) test strip Use as instructed twice daily diag E11.65   . glyBURIDE-metformin (GLUCOVANCE) 5-500 MG tablet 2 with breakfast and 2 with dinner   . Insulin Glargine, 1 Unit Dial, (TOUJEO SOLOSTAR) 300 UNIT/ML SOPN Inject 15 Units into the skin every evening.   Marland Kitchen lisinopril (ZESTRIL) 5 MG tablet Take 1 tablet (5 mg total) by mouth daily.   . meloxicam (MOBIC) 7.5 MG tablet TAKE 1 TABLET TWICE DAILY AS NEEDED FOR  JOINT  PAIN   . metoprolol succinate (TOPROL-XL) 50 MG 24 hr tablet Take 1 tablet (50 mg total) by mouth at bedtime. Take with or immediately following a meal.   . NOVOFINE 32G X 6 MM MISC To use with [place] injections daily   . omeprazole (PRILOSEC) 40 MG capsule Take 1 capsule (40 mg total) by mouth daily.   Marland Kitchen triamcinolone ointment (KENALOG) 0.1 % Apply 1 application topically 2 (two) times daily.   . TRUEPLUS LANCETS 33G MISC Use as directed twice daily diag E11.65   . [DISCONTINUED] hydrochlorothiazide (HYDRODIURIL) 25 MG tablet TAKE 1 TABLET EVERY MORNING FOR BLOOD PRESSURE 12/31/2018: start hydralazine 10mg  three times daily  . clobetasol cream (TEMOVATE) 5.91 % Apply 1 application topically 2 (two) times daily.   . ferrous gluconate (FERGON) 324 MG tablet Take 1 tablet (324 mg total) by mouth daily with breakfast.   .  hydrALAZINE (APRESOLINE) 10 MG tablet Take 1 tablet (10 mg total) by mouth 3 (three) times daily.   . nicotine (NICODERM CQ - DOSED IN MG/24 HOURS) 21 mg/24hr patch Place 1 patch (21 mg total) onto the skin daily.    No facility-administered encounter medications on file as of 12/31/2018.     Surgical History: Past Surgical History:  Procedure Laterality Date  . CHOLECYSTECTOMY    . left arm surgery    . nephrectomy Left   . PARATHYROIDECTOMY      Medical History: Past Medical History:  Diagnosis Date  . Allergy   . Coronary artery disease   . Diabetes mellitus without complication ([place])   . GERD (gastroesophageal reflux disease)   . Hyperlipidemia   .  Hypertension   . Sleep apnea     Family History: Family History  Problem Relation Age of Onset  . Diabetes Mother   . Lung cancer Father   . Diabetes Sister   . Hypertension Sister   . Heart disease Sister   . Cancer Sister   . Bone cancer Brother     Social History   Socioeconomic History  . Marital status: Single    Spouse name: Not on file  . Number of children: Not on file  . Years of education: Not on file  . Highest education level: Not on file  Occupational History  . Not on file  Social Needs  . Financial resource strain: Not on file  . Food insecurity    Worry: Not on file    Inability: Not on file  . Transportation needs    Medical: Not on file    Non-medical: Not on file  Tobacco Use  . Smoking status: Current Every Day Smoker    Types: Cigarettes  . Smokeless tobacco: Former Systems developer    Types: Chew  Substance and Sexual Activity  . Alcohol use: No    Frequency: Never  . Drug use: No  . Sexual activity: Not on file  Lifestyle  . Physical activity    Days per week: Not on file    Minutes per session: Not on file  . Stress: Not on file  Relationships  . Social Herbalist on phone: Not on file    Gets together: Not on file    Attends religious service: Not on file    Active member of club or organization: Not on file    Attends meetings of clubs or organizations: Not on file    Relationship status: Not on file  . Intimate partner violence    Fear of current or ex partner: Not on file    Emotionally abused: Not on file    Physically abused: Not on file    Forced sexual activity: Not on file  Other Topics Concern  . Not on file  Social History Narrative  . Not on file      Review of Systems  Constitutional: Positive for fatigue. Negative for activity change, chills and unexpected weight change.  HENT: Negative for congestion, postnasal drip, rhinorrhea, sneezing and sore throat.   Respiratory: Positive for shortness of breath.  Negative for cough and chest tightness.        Shortness of breath with exertion.   Cardiovascular: Negative for chest pain and palpitations.       Moderate elevation of blood pressure   Gastrointestinal: Negative for abdominal pain, constipation, diarrhea, nausea and vomiting.  Endocrine: Negative for cold intolerance, heat intolerance, polydipsia  and polyuria.  Musculoskeletal: Negative for arthralgias, back pain, joint swelling and neck pain.  Skin: Positive for rash.       Itchy, dry, flaky skin on right lower leg. Does get a bit better after he puts on triamcinolone ointment.   Allergic/Immunologic: Negative for environmental allergies, food allergies and immunocompromised state.  Neurological: Positive for headaches. Negative for tremors and numbness.  Hematological: Negative for adenopathy. Does not bruise/bleed easily.  Psychiatric/Behavioral: Negative for behavioral problems (Depression), sleep disturbance and suicidal ideas. The patient is not nervous/anxious.     Today's Vitals   12/31/18 1334  BP: (!) 152/80  Pulse: 69  Resp: 16  SpO2: 95%  Weight: 287 lb 9.6 oz (130.5 kg)  Height: 5\' 11"  (1.803 m)   Body mass index is 40.11 kg/m.  Physical Exam Vitals signs and nursing note reviewed.  Constitutional:      General: He is not in acute distress.    Appearance: Normal appearance. He is well-developed. He is obese. He is not diaphoretic.  HENT:     Head: Normocephalic and atraumatic.     Mouth/Throat:     Pharynx: No oropharyngeal exudate.  Eyes:     Pupils: Pupils are equal, round, and reactive to light.  Neck:     Musculoskeletal: Normal range of motion and neck supple.     Thyroid: No thyromegaly.     Vascular: No JVD.     Trachea: No tracheal deviation.  Cardiovascular:     Rate and Rhythm: Normal rate and regular rhythm.     Heart sounds: Normal heart sounds. No murmur. No friction rub. No gallop.   Pulmonary:     Effort: Pulmonary effort is normal. No  respiratory distress.     Breath sounds: Wheezing present. No rales.     Comments: Mild,soft expiratory wheezes present in bilateral lung apices.  Chest:     Chest wall: No tenderness.  Abdominal:     General: Bowel sounds are normal.     Palpations: Abdomen is soft.  Musculoskeletal: Normal range of motion.  Lymphadenopathy:     Cervical: No cervical adenopathy.  Skin:    General: Skin is warm and dry.     Findings: Erythema and rash present.     Comments: There is large area of red, flaky skin to lateral aspect of right leg. Itchy and a little tender when touched. Skin is intact with no focal lesion present.   Neurological:     Mental Status: He is alert and oriented to person, place, and time.     Cranial Nerves: No cranial nerve deficit.  Psychiatric:        Behavior: Behavior normal.        Thought Content: Thought content normal.        Judgment: Judgment normal.   Assessment/Plan: 1. Dyspnea on exertion Patient continuing to have shortness of breath, especially with exertion. Will get new set PFT and refer to pulmonology for further evaluation. Will also refer to Dr. Candis Musa, cardiology, per patient request.  - Pulmonary function test; Future - Ambulatory referral to Cardiology  2. Diastolic dysfunction Reviewed results of echocardiogram with patient and family. Shows grade II diastolic dysfunction. Will get better control of blood pressure. Refer to Dr. Candis Musa, cardiology per request, as he was referred to cardiology while in ER.  - Ambulatory referral to Cardiology  3. Essential hypertension D/c hydrochlorothiazide. Start hydralazine 10mg  three times daily. Continue other blood pressure medications as prescribed  - hydrALAZINE (APRESOLINE)  10 MG tablet; Take 1 tablet (10 mg total) by mouth 3 (three) times daily.  Dispense: 90 tablet; Refill: 3  4. Iron deficiency anemia, unspecified iron deficiency anemia type Start oral iron supplementation. Refer to GI for further  evaluation. Patient likely needs colonoscopy and endoscopy.  - Ambulatory referral to Gastroenterology - ferrous gluconate (FERGON) 324 MG tablet; Take 1 tablet (324 mg total) by mouth daily with breakfast.  Dispense: 30 tablet; Refill: 3  5. Screening for colon cancer - Ambulatory referral to Gastroenterology  6. Atopic dermatitis, unspecified type Add clobetasol cream. Apply to affected area twice daily.  - clobetasol cream (TEMOVATE) 0.05 %; Apply 1 application topically 2 (two) times daily.  Dispense: 60 g; Refill: 1  7. Cigarette nicotine dependence with nicotine-induced disorder Start nicotine patch 21mg  daily. Verbal instructions provided for use. Patient and family voiced understanding.  - nicotine (NICODERM CQ - DOSED IN MG/24 HOURS) 21 mg/24hr patch; Place 1 patch (21 mg total) onto the skin daily.  Dispense: 28 patch; Refill: 0  General Counseling: Stokes verbalizes understanding of the findings of todays visit and agrees with plan of treatment. I have discussed any further diagnostic evaluation that may be needed or ordered today. We also reviewed his medications today. he has been encouraged to call the office with any questions or concerns that should arise related to todays visit.  Hypertension Counseling:   The following hypertensive lifestyle modification were recommended and discussed:  1. Limiting alcohol intake to less than 1 oz/day of ethanol:(24 oz of beer or 8 oz of wine or 2 oz of 100-proof whiskey). 2. Take baby ASA 81 mg daily. 3. Importance of regular aerobic exercise and losing weight. 4. Reduce dietary saturated fat and cholesterol intake for overall cardiovascular health. 5. Maintaining adequate dietary potassium, calcium, and magnesium intake. 6. Regular monitoring of the blood pressure. 7. Reduce sodium intake to less than 100 mmol/day (less than 2.3 gm of sodium or less than 6 gm of sodium choride)   This patient was seen by Wagner  with Dr Lavera Guise as a part of collaborative care agreement  Orders Placed This Encounter  Procedures  . Ambulatory referral to Gastroenterology  . Ambulatory referral to Cardiology  . Pulmonary function test    Meds ordered this encounter  Medications  . clobetasol cream (TEMOVATE) 0.05 %    Sig: Apply 1 application topically 2 (two) times daily.    Dispense:  60 g    Refill:  1    Order Specific Question:   Supervising Provider    Answer:   Lavera Guise [[id number]]  . nicotine (NICODERM CQ - DOSED IN MG/24 HOURS) 21 mg/24hr patch    Sig: Place 1 patch (21 mg total) onto the skin daily.    Dispense:  28 patch    Refill:  0    Order Specific Question:   Supervising Provider    Answer:   Lavera Guise [[id number]]  . hydrALAZINE (APRESOLINE) 10 MG tablet    Sig: Take 1 tablet (10 mg total) by mouth 3 (three) times daily.    Dispense:  90 tablet    Refill:  3    D/c hydrochlorothiazide    Order Specific Question:   Supervising Provider    Answer:   Lavera Guise [[id number]]  . ferrous gluconate (FERGON) 324 MG tablet    Sig: Take 1 tablet (324 mg total) by mouth daily with breakfast.    Dispense:  30 tablet    Refill:  3    Order Specific Question:   Supervising Provider    Answer:   Lavera Guise [[id number]]    Time spent: 10 Minutes      Dr Lavera Guise Internal medicine

## 2019-01-08 ENCOUNTER — Other Ambulatory Visit: Payer: Self-pay

## 2019-01-08 MED ORDER — GLYBURIDE-METFORMIN 5-500 MG PO TABS: ORAL_TABLET | ORAL | 1 refills | Status: DC

## 2019-01-10 ENCOUNTER — Ambulatory Visit: Payer: Self-pay | Admitting: Internal Medicine

## 2019-01-23 ENCOUNTER — Ambulatory Visit: Payer: Medicare PPO | Admitting: Internal Medicine

## 2019-01-23 ENCOUNTER — Other Ambulatory Visit: Payer: Self-pay

## 2019-01-23 DIAGNOSIS — R0602 Shortness of breath: Secondary | ICD-10-CM | POA: Diagnosis not present

## 2019-01-23 LAB — PULMONARY FUNCTION TEST

## 2019-01-31 ENCOUNTER — Ambulatory Visit: Payer: Self-pay | Admitting: Internal Medicine

## 2019-02-04 ENCOUNTER — Ambulatory Visit: Payer: Medicare PPO | Admitting: Nurse Practitioner

## 2019-02-05 ENCOUNTER — Ambulatory Visit: Payer: Medicare PPO | Admitting: Nurse Practitioner

## 2019-02-07 NOTE — Procedures (Signed)
[facility] [place], [address]  DATE OF SERVICE: January 23, 2019  Complete Pulmonary Function Testing Interpretation:  FINDINGS:  Forced vital capacity is mildly decreased.  The FEV1 is 2.43 L which is 70% of predicted and is mildly decreased.  FEV1 FVC ratio is normal.  Postbronchodilator there is no significant improvement in the FEV1.  Total lung capacity is mildly decreased.  Residual volume is decreased residual volume total lung capacity ratio is normal thoracic gas volume is decreased.  DLCO is mildly decreased.  IMPRESSION:  This pulmonary function study is consistent with mild restrictive lung disease.  The DLCO is also mildly decreased.  There does not appear to be improvement postbronchodilator clinical improvement may occur in the absence of spirometric improvement.  [name], MD [facility] Pulmonary Critical Care Medicine Sleep Medicine

## 2019-02-10 NOTE — Progress Notes (Signed)
Hey. This guy sees you 9/24

## 2019-02-20 ENCOUNTER — Ambulatory Visit
Admission: RE | Admit: 2019-02-20 | Discharge: 2019-02-20 | Disposition: A | Discharge status: Home / Self Care | Payer: Medicare PPO | Source: Ambulatory Visit | Attending: Adult Health | Admitting: Adult Health

## 2019-02-20 ENCOUNTER — Emergency Department: Payer: Medicare PPO

## 2019-02-20 ENCOUNTER — Other Ambulatory Visit: Payer: Self-pay

## 2019-02-20 ENCOUNTER — Ambulatory Visit (INDEPENDENT_AMBULATORY_CARE_PROVIDER_SITE_OTHER): Payer: Medicare PPO | Admitting: Adult Health

## 2019-02-20 ENCOUNTER — Encounter: Payer: Self-pay | Admitting: Emergency Medicine

## 2019-02-20 ENCOUNTER — Emergency Department
Admission: EM | Admit: 2019-02-20 | Discharge: 2019-02-20 | Disposition: A | Discharge status: Home / Self Care | Payer: Medicare PPO | Attending: Emergency Medicine | Admitting: Emergency Medicine

## 2019-02-20 ENCOUNTER — Encounter: Payer: Self-pay | Admitting: Adult Health

## 2019-02-20 VITALS — BP 178/96 | HR 79 | Temp 97.8°F | Ht 71.0 in

## 2019-02-20 DIAGNOSIS — Z20828 Contact with and (suspected) exposure to other viral communicable diseases: Secondary | ICD-10-CM | POA: Diagnosis not present

## 2019-02-20 DIAGNOSIS — R0602 Shortness of breath: Secondary | ICD-10-CM

## 2019-02-20 DIAGNOSIS — Z87891 Personal history of nicotine dependence: Secondary | ICD-10-CM | POA: Diagnosis not present

## 2019-02-20 DIAGNOSIS — J449 Chronic obstructive pulmonary disease, unspecified: Secondary | ICD-10-CM | POA: Diagnosis not present

## 2019-02-20 DIAGNOSIS — E119 Type 2 diabetes mellitus without complications: Secondary | ICD-10-CM | POA: Insufficient documentation

## 2019-02-20 DIAGNOSIS — Z794 Long term (current) use of insulin: Secondary | ICD-10-CM | POA: Diagnosis not present

## 2019-02-20 DIAGNOSIS — R069 Unspecified abnormalities of breathing: Secondary | ICD-10-CM | POA: Diagnosis not present

## 2019-02-20 DIAGNOSIS — F17219 Nicotine dependence, cigarettes, with unspecified nicotine-induced disorders: Secondary | ICD-10-CM | POA: Diagnosis not present

## 2019-02-20 DIAGNOSIS — Z7982 Long term (current) use of aspirin: Secondary | ICD-10-CM | POA: Insufficient documentation

## 2019-02-20 DIAGNOSIS — R918 Other nonspecific abnormal finding of lung field: Secondary | ICD-10-CM | POA: Diagnosis not present

## 2019-02-20 DIAGNOSIS — Z79899 Other long term (current) drug therapy: Secondary | ICD-10-CM | POA: Diagnosis not present

## 2019-02-20 DIAGNOSIS — I10 Essential (primary) hypertension: Secondary | ICD-10-CM | POA: Insufficient documentation

## 2019-02-20 DIAGNOSIS — I2510 Atherosclerotic heart disease of native coronary artery without angina pectoris: Secondary | ICD-10-CM | POA: Diagnosis not present

## 2019-02-20 DIAGNOSIS — R300 Dysuria: Secondary | ICD-10-CM | POA: Diagnosis not present

## 2019-02-20 LAB — CBC
HCT: 29.8 % — ABNORMAL LOW (ref 39.0–52.0)
Hemoglobin: 9 g/dL — ABNORMAL LOW (ref 13.0–17.0)
MCH: 25.6 pg — ABNORMAL LOW (ref 26.0–34.0)
MCHC: 30.2 g/dL (ref 30.0–36.0)
MCV: 84.7 fL (ref 80.0–100.0)
Platelets: 164 10*3/uL (ref 150–400)
RBC: 3.52 MIL/uL — ABNORMAL LOW (ref 4.22–5.81)
RDW: 16.4 % — ABNORMAL HIGH (ref 11.5–15.5)
WBC: 8.9 10*3/uL (ref 4.0–10.5)
nRBC: 0 % (ref 0.0–0.2)

## 2019-02-20 LAB — BASIC METABOLIC PANEL
Anion gap: 11 (ref 5–15)
BUN: 24 mg/dL — ABNORMAL HIGH (ref 8–23)
CO2: 21 mmol/L — ABNORMAL LOW (ref 22–32)
Calcium: 8.8 mg/dL — ABNORMAL LOW (ref 8.9–10.3)
Chloride: 108 mmol/L (ref 98–111)
Creatinine, Ser: 1.58 mg/dL — ABNORMAL HIGH (ref 0.61–1.24)
GFR calc Af Amer: 52 mL/min — ABNORMAL LOW (ref >60)
GFR calc non Af Amer: 45 mL/min — ABNORMAL LOW (ref >60)
Glucose, Bld: 172 mg/dL — ABNORMAL HIGH (ref 70–99)
Potassium: 4.3 mmol/L (ref 3.5–5.1)
Sodium: 140 mmol/L (ref 135–145)

## 2019-02-20 LAB — POCT URINALYSIS DIPSTICK
Bilirubin, UA: NEGATIVE
Glucose, UA: NEGATIVE
Ketones, UA: NEGATIVE
Leukocytes, UA: NEGATIVE
Nitrite, UA: NEGATIVE
Protein, UA: POSITIVE — AB
Spec Grav, UA: 1.01 (ref 1.010–1.025)
Urobilinogen, UA: 0.2 E.U./dL
pH, UA: 7 (ref 5.0–8.0)

## 2019-02-20 LAB — TROPONIN I (HIGH SENSITIVITY)
Troponin I (High Sensitivity): 14 ng/L (ref <18)
Troponin I (High Sensitivity): 17 ng/L (ref <18)

## 2019-02-20 MED ORDER — IOHEXOL 350 MG/ML SOLN
75.0000 mL | Freq: Once | INTRAVENOUS | Status: AC | PRN
  Administered 2019-02-20: 75 mL via INTRAVENOUS

## 2019-02-20 MED ORDER — PANTOPRAZOLE SODIUM 40 MG IV SOLR: 40.0000 mg | Freq: Once | INTRAVENOUS | Status: DC

## 2019-02-20 MED ORDER — METOCLOPRAMIDE HCL 5 MG/ML IJ SOLN: 10.0000 mg | Freq: Once | INTRAMUSCULAR | Status: DC

## 2019-02-20 MED ORDER — SODIUM CHLORIDE 0.9% FLUSH: 3.0000 mL | Freq: Once | INTRAVENOUS | Status: DC

## 2019-02-20 MED ORDER — ASPIRIN 81 MG PO CHEW: 324.0000 mg | CHEWABLE_TABLET | Freq: Once | ORAL | Status: DC

## 2019-02-20 MED ORDER — FUROSEMIDE 20 MG PO TABS: 20.0000 mg | ORAL_TABLET | Freq: Every day | ORAL | 0 refills | Status: DC

## 2019-02-20 MED ORDER — FUROSEMIDE 10 MG/ML IJ SOLN
20.0000 mg | Freq: Once | INTRAMUSCULAR | Status: AC
  Administered 2019-02-20: 20 mg via INTRAVENOUS
  Filled 2019-02-20: qty 4

## 2019-02-20 MED ORDER — POTASSIUM CHLORIDE ER 10 MEQ PO TBCR: 10.0000 meq | EXTENDED_RELEASE_TABLET | Freq: Two times a day (BID) | ORAL | 0 refills | Status: DC

## 2019-02-20 MED ORDER — DIPHENHYDRAMINE HCL 50 MG/ML IJ SOLN: 50.0000 mg | Freq: Once | INTRAMUSCULAR | Status: DC

## 2019-02-20 NOTE — ED Triage Notes (Signed)
ARrives from home.  C/O difficulty breathing since 1230 this morning.  Went to see PCP today, found nothing, told patient to come to ED if symptoms worsened.  Patient states breathing has not improved.  Recently stopped smoking (stopped in July 2020).  Sats 95% on RA.

## 2019-02-20 NOTE — ED Provider Notes (Signed)
[facility] Emergency Department Provider Note  ____________________________________________  Time seen: Approximately 6:53 PM  I have reviewed the triage vital signs and the nursing notes.   HISTORY  Chief Complaint Shortness of breath   HPI Larry Callahan is a 63 y.o. male with a history of CAD diabetes GERD hypertension who comes the ED complaining of shortness of breath for the past 3 months, constant, worse today.  Was seen by me 2 months ago in the ED for same symptoms, had a negative work-up and was discharged.  Recently followed up with his primary care doctor who also had an unrevealing work-up and is monitoring his symptoms.  Patient reports that he stopped smoking 2 months ago.   Denies fever chills body aches.  Denies chest pain.  Feels short of breath with exertion.  No alleviating factors.  Symptoms are constant but waxing and waning.     Past Medical History:  Diagnosis Date  . Allergy   . Coronary artery disease   . Diabetes mellitus without complication ([place])   . GERD (gastroesophageal reflux disease)   . Hyperlipidemia   . Hypertension   . Sleep apnea      Patient Active Problem List   Diagnosis Date Noted  . Diastolic dysfunction 40/01/6760  . Iron deficiency anemia 12/31/2018  . Atopic dermatitis 11/24/2018  . Screening for colon cancer 11/06/2017  . Uncontrolled type 2 diabetes mellitus with hyperglycemia ([place]) 11/06/2017  . Dermatitis, unspecified 06/09/2017  . Atherosclerotic heart disease of native coronary artery without angina pectoris 06/09/2017  . 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 Obstructive sleep apnea, adult 06/09/2017  . Morbid (severe) obesity due to excess calories ([place]) 06/09/2017  . Cigarette nicotine dependence with nicotine-induced disorder 06/09/2017  . Type 2 diabetes mellitus with diabetic polyneuropathy ([place]) 06/09/2017  . Allergic rhinitis due to pollen  06/09/2017  . Mixed hyperlipidemia 06/09/2017  . Chronic obstructive pulmonary disease, unspecified ([place]) 06/09/2017  . Dyspnea on exertion 06/09/2017  . Wheezing 06/09/2017  . Dysuria 06/09/2017  . Snoring 06/09/2017  . Essential hypertension 06/09/2017  . Personal history of other malignant neoplasm of kidney 06/09/2017  . Pain in right hip 06/09/2017  . Impacted cerumen, bilateral 06/09/2017  . Secondary hyperparathyroidism, not elsewhere classified ([place]) 06/09/2017  . Type 2 diabetes mellitus with hyperglycemia ([place]) 06/09/2017  . Tinea corporis 06/09/2017     Past Surgical History:  Procedure Laterality Date  . CHOLECYSTECTOMY    . left arm surgery    . nephrectomy Left   . PARATHYROIDECTOMY       Prior to Admission medications   Medication Sig Start Date End Date Taking? Authorizing Provider  Alcohol Swabs (B-D SINGLE USE SWABS BUTTERFLY) PADS Use as directed twice a daily diag e11.65 04/04/18   [name], NP  Alcohol Swabs PADS 1 each. 04/04/18   [provider]  amLODipine-benazepril (LOTREL) 5-20 MG capsule TAKE  (1)  CAPSULE  TWICE DAILY. 09/28/18   [name], NP  aspirin EC 81 MG tablet Take 81 mg by mouth daily.    [provider]  atorvastatin (LIPITOR) 10 MG tablet Take 1 tablet (10 mg total) by mouth daily. 08/16/18   [name], NP  Blood Glucose Calibration (TRUE METRIX LEVEL 1) Low SOLN Use as directed 04/04/18   [name], NP  Blood Glucose Monitoring Suppl (TRUE METRIX AIR GLUCOSE METER) DEVI 1 Device by Does not apply route 2 (two) times  daily. 04/13/18   [name], NP  clobetasol cream (TEMOVATE) 3.49 % Apply 1 application topically 2 (two) times daily. 12/31/18   [name], NP  ferrous gluconate (FERGON) 324 MG tablet Take 1 tablet (324 mg total) by mouth daily with breakfast. 12/31/18   [name], NP  furosemide (LASIX) 20 MG tablet Take 1 tablet (20 mg total) by mouth daily with breakfast  for 7 days. 02/20/19 02/27/19  Carrie Mew, MD  gemfibrozil (LOPID) 600 MG tablet Take 1 tablet (600 mg total) by mouth 2 (two) times daily with a meal. 08/16/18   [name], NP  glucose blood (TRUE METRIX BLOOD GLUCOSE TEST) test strip Use as instructed twice daily diag E11.65 11/19/18   [name], NP  glyBURIDE-metformin (GLUCOVANCE) 5-500 MG tablet 2 with breakfast and 2 with dinner 01/08/19   [name], NP  hydrALAZINE (APRESOLINE) 10 MG tablet Take 1 tablet (10 mg total) by mouth 3 (three) times daily. 12/31/18   [name], NP  Insulin Glargine, 1 Unit Dial, (TOUJEO SOLOSTAR) 300 UNIT/ML SOPN Inject 15 Units into the skin every evening. 11/23/18 02/21/19  [name], NP  lisinopril (ZESTRIL) 5 MG tablet Take 1 tablet (5 mg total) by mouth daily. 11/23/18   [name], NP  meloxicam (MOBIC) 7.5 MG tablet TAKE 1 TABLET TWICE DAILY AS NEEDED FOR  JOINT  PAIN 08/16/18   [name], NP  metoprolol succinate (TOPROL-XL) 50 MG 24 hr tablet Take 1 tablet (50 mg total) by mouth at bedtime. Take with or immediately following a meal. 08/16/18   Boscia, Heather E, NP  nicotine (NICODERM CQ - DOSED IN MG/24 HOURS) 21 mg/24hr patch Place 1 patch (21 mg total) onto the skin daily. 12/31/18   [name], NP  NOVOFINE 32G X 6 MM MISC To use with [place] injections daily 12/13/18   [name], NP  omeprazole (PRILOSEC) 40 MG capsule Take 1 capsule (40 mg total) by mouth daily. 08/16/18   [name], NP  potassium chloride (K-DUR) 10 MEQ tablet Take 1 tablet (10 mEq total) by mouth 2 (two) times daily for 7 days. 02/20/19 02/27/19  Carrie Mew, MD  triamcinolone ointment (KENALOG) 0.1 % Apply 1 application topically 2 (two) times daily. 11/23/18   [name], NP  TRUEPLUS LANCETS 33G MISC Use as directed twice daily diag E11.65 04/04/18   [name], NP     Allergies Penicillins   Family History  Problem Relation Age of Onset  . Diabetes  Mother   . Lung cancer Father   . Diabetes Sister   . Hypertension Sister   . Heart disease Sister   . Cancer Sister   . Bone cancer Brother     Social History Social History   Tobacco Use  . Smoking status: Former Research scientist (life sciences)  . Smokeless tobacco: Former Systems developer    Types: Chew  Substance Use Topics  . Alcohol use: No    Frequency: Never  . Drug use: No    Review of Systems  Constitutional:   No fever or chills.  ENT:   No sore throat. No rhinorrhea. Cardiovascular:   No chest pain or syncope. Respiratory:   Positive shortness of breath without cough. Gastrointestinal:   Negative for abdominal pain, vomiting and diarrhea.  Musculoskeletal:   Negative for focal pain or swelling All other systems reviewed and are negative except as documented above in ROS and HPI.  ____________________________________________   PHYSICAL EXAM:  VITAL SIGNS: ED Triage Vitals  Enc Vitals Group     BP 02/20/19 1649 (!) 156/89     Pulse Rate 02/20/19 1649 79     Resp 02/20/19 1649 16     Temp 02/20/19 1651 98.9 F (37.2 C)     Temp Source 02/20/19 1649 Oral     SpO2 02/20/19 1649 95 %     Weight 02/20/19 1650 287 lb 11.2 oz (130.5 kg)     Height 02/20/19 1650 5\' 11"  (1.803 m)     Head Circumference --      Peak Flow --      Pain Score 02/20/19 1650 0     Pain Loc --      Pain Edu? --      Excl. in [place]? --     Vital signs reviewed, nursing assessments reviewed.   Constitutional:   Alert and oriented. Non-toxic appearance. Eyes:   Conjunctivae are normal. EOMI. PERRL. ENT      Head:   Normocephalic and atraumatic.        Neck:   No meningismus. Full ROM.  No JVD Hematological/Lymphatic/Immunilogical:   No cervical lymphadenopathy. Cardiovascular:   RRR. Symmetric bilateral radial and DP pulses.  No murmurs. Cap refill less than 2 seconds. Respiratory:   Normal respiratory effort without tachypnea/retractions.  Diffuse expiratory wheezing.. Gastrointestinal:   Soft and nontender. Non  distended. There is no CVA tenderness.  No rebound, rigidity, or guarding.  Musculoskeletal:   Normal range of motion in all extremities. No joint effusions.  No lower extremity tenderness.  No edema. Neurologic:   Normal speech and language.  Motor grossly intact. No acute focal neurologic deficits are appreciated.  Skin:    Skin is warm, dry and intact. No rash noted.  No petechiae, purpura, or bullae.  ____________________________________________    LABS (pertinent positives/negatives) (all labs ordered are listed, but only abnormal results are displayed) Labs Reviewed  BASIC METABOLIC PANEL - Abnormal; Notable for the following components:      Result Value   CO2 21 (*)    Glucose, Bld 172 (*)    BUN 24 (*)    Creatinine, Ser 1.58 (*)    Calcium 8.8 (*)    GFR calc non Af Amer 45 (*)    GFR calc Af Amer 52 (*)    All other components within normal limits  CBC - Abnormal; Notable for the following components:   RBC 3.52 (*)    Hemoglobin 9.0 (*)    HCT 29.8 (*)    MCH 25.6 (*)    RDW 16.4 (*)    All other components within normal limits  NOVEL CORONAVIRUS, NAA (HOSP ORDER, SEND-OUT TO REF LAB; TAT 18-24 HRS)  TROPONIN I (HIGH SENSITIVITY)  TROPONIN I (HIGH SENSITIVITY)   ____________________________________________   EKG  Interpreted by me Sinus rhythm rate of 80, normal axis and intervals.  Poor R wave progression.  Normal ST segments and T waves.  No evidence of right heart strain.  ____________________________________________    RADIOLOGY  Dg Chest 2 View  Result Date: 02/20/2019 CLINICAL DATA:  Shortness of breath EXAM: CHEST - 2 VIEW COMPARISON:  12/29/2018 FINDINGS: Cardiac shadow is stable. The lungs are well aerated bilaterally. No focal infiltrate or sizable effusion is seen. Degenerative changes of the thoracic spine are noted. IMPRESSION: No active cardiopulmonary disease. Electronically Signed   By: Inez Catalina M.D.   On: 02/20/2019 15:00  Ct Angio  Chest Pe W And/or Wo Contrast  Result Date: 02/20/2019 CLINICAL DATA:  Short of breath. High pretest probability for pulmonary embolus. EXAM: CT ANGIOGRAPHY CHEST WITH CONTRAST TECHNIQUE: Multidetector CT imaging of the chest was performed using the standard protocol during bolus administration of intravenous contrast. Multiplanar CT image reconstructions and MIPs were obtained to evaluate the vascular anatomy. CONTRAST:  7mL OMNIPAQUE IOHEXOL 350 MG/ML SOLN COMPARISON:  03/06/2013 FINDINGS: Cardiovascular: Normal heart size. No pericardial effusion. Aortic atherosclerosis and LAD and left circumflex coronary artery calcifications. The main pulmonary artery is patent. No saddle embolus or central obstructing pulmonary embolus. Patent lobar pulmonary arteries. No segmental pulmonary artery filling defects. Mediastinum/Nodes: No enlarged mediastinal, hilar, or axillary lymph nodes. Thyroid gland, trachea, and esophagus demonstrate no significant findings. Lungs/Pleura: No pleural effusion identified. No airspace consolidation, atelectasis or pneumothorax. Upper Abdomen: No acute abnormality noted within the upper abdomen. Previous cholecystectomy. Musculoskeletal: No chest wall abnormality. No acute or significant osseous findings. Thoracic spondylosis noted. Review of the MIP images confirms the above findings. IMPRESSION: 1. No evidence for acute pulmonary embolus. 2.  Aortic Atherosclerosis (ICD10-I70.0). 3. Coronary artery calcifications. Electronically Signed   By: Kerby Moors M.D.   On: 02/20/2019 19:08   Dg Chest Port 1 View  Result Date: 02/20/2019 CLINICAL DATA:  63 year old male with shortness of breath. EXAM: PORTABLE CHEST 1 VIEW COMPARISON:  Earlier chest radiograph dated 02/20/2019. FINDINGS: There is mild cardiomegaly with vascular congestion, progressed since the prior radiograph. No focal consolidation, pleural effusion, or pneumothorax. No acute osseous pathology. IMPRESSION: Mild  cardiomegaly with vascular congestion, worsened since the earlier radiograph. No focal consolidation. Electronically Signed   By: Anner Crete M.D.   On: 02/20/2019 17:20    ____________________________________________   PROCEDURES Procedures  ____________________________________________  DIFFERENTIAL DIAGNOSIS   Congestive heart failure, pulmonary edema, pleural effusion, COVID, pulmonary embolism  CLINICAL IMPRESSION / ASSESSMENT AND PLAN / ED COURSE  Medications ordered in the ED: Medications  sodium chloride flush (NS) 0.9 % injection 3 mL (3 mLs Intravenous Not Given 02/20/19 1858)  furosemide (LASIX) injection 20 mg (has no administration in time range)  iohexol (OMNIPAQUE) 350 MG/ML injection 75 mL (75 mLs Intravenous Contrast Given 02/20/19 1842)    Pertinent labs & imaging results that were available during my care of the patient were reviewed by me and considered in my medical decision making (see chart for details).  Larry Callahan was evaluated in Emergency Department on 02/20/2019 for the symptoms described in the history of present illness. He was evaluated in the context of the global COVID-19 pandemic, which necessitated consideration that the patient might be at risk for infection with the SARS-CoV-2 virus that causes COVID-19. Institutional protocols and algorithms that pertain to the evaluation of patients at risk for COVID-19 are in a state of rapid change based on information released by regulatory bodies including the CDC and federal and state organizations. These policies and algorithms were followed during the patient's care in the ED.   Patient presents with chronic shortness of breath.  No chest pain.  Due to obesity and comorbidities, will obtain troponins to rule out MI.  Symptoms are not consistent with unstable angina.   After his last ED visit, patient followed up with primary care who obtained an echocardiogram and did pulmonary function test.  PFTs  show mild restrictive lung disease, not bronchodilator responsive.  Echocardiogram shows grade 2 diastolic dysfunction.  Patient was referred to cardiology Dr. Rockey Situ.  Will need  to obtain a CT angiogram to evaluate for PE as well.  If negative, I think patient can be discharged home to follow-up with PCP and heart failure clinic.  Clinical Course as of Feb 19 1930  Wed Feb 20, 2019  1920 CTA negative for PE.  Awaiting second troponin, if unremarkable delta, patient can be discharged home.  Will give trial of Lasix to see if this helps with his symptoms.   [PS]  1928 Repeat troponin unremarkable, not consistent with ACS.  Plan to discharge home   [PS]    Clinical Course User Index [PS] Carrie Mew, MD     ____________________________________________   FINAL CLINICAL IMPRESSION(S) / ED DIAGNOSES    Final diagnoses:  Shortness of breath     ED Discharge Orders         Ordered    furosemide (LASIX) 20 MG tablet  Daily with breakfast     02/20/19 1930    potassium chloride (K-DUR) 10 MEQ tablet  2 times daily     02/20/19 1930          Portions of this note were generated with dragon dictation software. Dictation errors may occur despite best attempts at proofreading.   Carrie Mew, MD 02/20/19 [phone]

## 2019-02-20 NOTE — Progress Notes (Signed)
[facility] [place], [place] [address]  Internal MEDICINE  Office Visit Note  Patient Name: Larry Callahan  [id number]  [id number]  Date of Service: 02/20/2019  Chief Complaint  Patient presents with  . Shortness of Breath    kept him up at night  . Facial Swelling  . Foot Swelling  . Urinary Frequency     HPI Pt is here for a sick visit. He has lower extremity edema, and shortness of breath that has been keeping him up at night.  He reports some facial swelling around his eyes.  He has been having some urinary frequency as well. He is not sure how long his legs have been swollen, but the shortness of breath has been present for 3 days.    Current Medication:  Outpatient Encounter Medications as of 02/20/2019  Medication Sig  . Alcohol Swabs (B-D SINGLE USE SWABS BUTTERFLY) PADS Use as directed twice a daily diag e11.65  . Alcohol Swabs PADS 1 each.  Marland Kitchen amLODipine-benazepril (LOTREL) 5-20 MG capsule TAKE  (1)  CAPSULE  TWICE DAILY.  Marland Kitchen aspirin EC 81 MG tablet Take 81 mg by mouth daily.  Marland Kitchen atorvastatin (LIPITOR) 10 MG tablet Take 1 tablet (10 mg total) by mouth daily.  . Blood Glucose Calibration (TRUE METRIX LEVEL 1) Low SOLN Use as directed  . Blood Glucose Monitoring Suppl (TRUE METRIX AIR GLUCOSE METER) DEVI 1 Device by Does not apply route 2 (two) times daily.  . clobetasol cream (TEMOVATE) 4.27 % Apply 1 application topically 2 (two) times daily.  . ferrous gluconate (FERGON) 324 MG tablet Take 1 tablet (324 mg total) by mouth daily with breakfast.  . gemfibrozil (LOPID) 600 MG tablet Take 1 tablet (600 mg total) by mouth 2 (two) times daily with a meal.  . glucose blood (TRUE METRIX BLOOD GLUCOSE TEST) test strip Use as instructed twice daily diag E11.65  . glyBURIDE-metformin (GLUCOVANCE) 5-500 MG tablet 2 with breakfast and 2 with dinner  . hydrALAZINE (APRESOLINE) 10 MG tablet Take 1 tablet (10 mg total) by mouth 3 (three) times daily.   . Insulin Glargine, 1 Unit Dial, (TOUJEO SOLOSTAR) 300 UNIT/ML SOPN Inject 15 Units into the skin every evening.  Marland Kitchen lisinopril (ZESTRIL) 5 MG tablet Take 1 tablet (5 mg total) by mouth daily.  . meloxicam (MOBIC) 7.5 MG tablet TAKE 1 TABLET TWICE DAILY AS NEEDED FOR  JOINT  PAIN  . metoprolol succinate (TOPROL-XL) 50 MG 24 hr tablet Take 1 tablet (50 mg total) by mouth at bedtime. Take with or immediately following a meal.  . nicotine (NICODERM CQ - DOSED IN MG/24 HOURS) 21 mg/24hr patch Place 1 patch (21 mg total) onto the skin daily.  Marland Kitchen NOVOFINE 32G X 6 MM MISC To use with [place] injections daily  . omeprazole (PRILOSEC) 40 MG capsule Take 1 capsule (40 mg total) by mouth daily.  Marland Kitchen triamcinolone ointment (KENALOG) 0.1 % Apply 1 application topically 2 (two) times daily.  . TRUEPLUS LANCETS 33G MISC Use as directed twice daily diag E11.65   No facility-administered encounter medications on file as of 02/20/2019.       Medical History: Past Medical History:  Diagnosis Date  . Allergy   . Coronary artery disease   . Diabetes mellitus without complication ([place])   . GERD (gastroesophageal reflux disease)   . Hyperlipidemia   . Hypertension   . Sleep apnea      Vital Signs: BP (!) 178/96  Pulse 79   Temp 97.8 F (36.6 C)   Ht 5\' 11"  (1.803 m)   SpO2 95%   BMI 40.11 kg/m    Review of Systems  Constitutional: Negative.  Negative for chills, fatigue and unexpected weight change.  HENT: Negative.  Negative for congestion, rhinorrhea, sneezing and sore throat.   Eyes: Negative for redness.  Respiratory: Negative.  Negative for cough, chest tightness and shortness of breath.   Cardiovascular: Negative.  Negative for chest pain and palpitations.  Gastrointestinal: Negative.  Negative for abdominal pain, constipation, diarrhea, nausea and vomiting.  Endocrine: Negative.   Genitourinary: Negative.  Negative for dysuria and frequency.  Musculoskeletal: Negative.  Negative for  arthralgias, back pain, joint swelling and neck pain.  Skin: Negative.  Negative for rash.  Allergic/Immunologic: Negative.   Neurological: Negative.  Negative for tremors and numbness.  Hematological: Negative for adenopathy. Does not bruise/bleed easily.  Psychiatric/Behavioral: Negative.  Negative for behavioral problems, sleep disturbance and suicidal ideas. The patient is not nervous/anxious.     Physical Exam Vitals signs and nursing note reviewed.  Constitutional:      General: He is not in acute distress.    Appearance: He is well-developed. He is not diaphoretic.  HENT:     Head: Normocephalic and atraumatic.     Mouth/Throat:     Pharynx: No oropharyngeal exudate.  Eyes:     Pupils: Pupils are equal, round, and reactive to light.  Neck:     Musculoskeletal: Normal range of motion and neck supple.     Thyroid: No thyromegaly.     Vascular: No JVD.     Trachea: No tracheal deviation.  Cardiovascular:     Rate and Rhythm: Normal rate and regular rhythm.     Heart sounds: Normal heart sounds. No murmur. No friction rub. No gallop.   Pulmonary:     Effort: Pulmonary effort is normal. No respiratory distress.     Breath sounds: Normal breath sounds. No wheezing or rales.  Chest:     Chest wall: No tenderness.  Abdominal:     Palpations: Abdomen is soft.     Tenderness: There is no abdominal tenderness. There is no guarding.  Musculoskeletal: Normal range of motion.  Lymphadenopathy:     Cervical: No cervical adenopathy.  Skin:    General: Skin is warm and dry.  Neurological:     Mental Status: He is alert and oriented to person, place, and time.     Cranial Nerves: No cranial nerve deficit.  Psychiatric:        Behavior: Behavior normal.        Thought Content: Thought content normal.        Judgment: Judgment normal.    Assessment/Plan: 1. Chronic obstructive pulmonary disease, unspecified COPD type ([place]) Pts breathing appears controlled in office.  Concern for  PNA or CHF.  Will get CXR and follow up with patient.   2. Shortness of breath PT will get cxr, concern for PnA, and CHF. Pt had improved SOB with neb treatment in office.  - DG Chest 2 View; Future - albuterol (PROVENTIL) (2.5 MG/3ML) 0.083% nebulizer solution 2.5 mg  3. Cigarette nicotine dependence with nicotine-induced disorder Smoking cessation counseling: 1. Pt acknowledges the risks of long term smoking, she will try to quite smoking. 2. Options for different medications including nicotine products, chewing gum, patch etc, Wellbutrin and Chantix is discussed 3. Goal and date of compete cessation is discussed 4. Total time spent in smoking cessation  is 15 min.  4. Dysuria - POCT Urinalysis Dipstick   General Counseling: Joaquim verbalizes understanding of the findings of todays visit and agrees with plan of treatment. I have discussed any further diagnostic evaluation that may be needed or ordered today. We also reviewed his medications today. he has been encouraged to call the office with any questions or concerns that should arise related to todays visit.   Orders Placed This Encounter  Procedures  . POCT Urinalysis Dipstick    No orders of the defined types were placed in this encounter.   Time spent: 20 Minutes  This patient was seen by Orson Gear AGNP-C in Collaboration with Dr Lavera Guise as a part of collaborative care agreement.  Kendell Bane AGNP-C Internal Medicine

## 2019-02-20 NOTE — Discharge Instructions (Addendum)
Your lab tests and CT scan of the chest were all okay today.  Please take Lasix and potassium as prescribed for the next week, follow-up with your primary care doctor in the heart failure clinic for further monitoring of your symptoms.

## 2019-02-20 NOTE — ED Notes (Signed)
[place], RN attempted to start IV x 2 unsuccessful.

## 2019-02-20 NOTE — ED Notes (Signed)
Dorian Furnace RN attempted IV.  IV team consult requested.

## 2019-02-21 ENCOUNTER — Ambulatory Visit: Payer: Medicare PPO | Admitting: Nurse Practitioner

## 2019-02-21 DIAGNOSIS — F17219 Nicotine dependence, cigarettes, with unspecified nicotine-induced disorders: Secondary | ICD-10-CM | POA: Diagnosis not present

## 2019-02-21 DIAGNOSIS — J449 Chronic obstructive pulmonary disease, unspecified: Secondary | ICD-10-CM | POA: Diagnosis not present

## 2019-02-21 DIAGNOSIS — R0602 Shortness of breath: Secondary | ICD-10-CM | POA: Diagnosis not present

## 2019-02-21 DIAGNOSIS — R300 Dysuria: Secondary | ICD-10-CM | POA: Diagnosis not present

## 2019-02-21 MED ORDER — ALBUTEROL SULFATE (2.5 MG/3ML) 0.083% IN NEBU
2.5000 mg | INHALATION_SOLUTION | Freq: Once | RESPIRATORY_TRACT | Status: AC
  Administered 2019-02-21: 2.5 mg via RESPIRATORY_TRACT

## 2019-02-22 LAB — NOVEL CORONAVIRUS, NAA (HOSP ORDER, SEND-OUT TO REF LAB; TAT 18-24 HRS): SARS-CoV-2, NAA: NOT DETECTED

## 2019-02-23 NOTE — Progress Notes (Deleted)
   Patient ID: Larry Callahan, male    DOB: 1956-06-11, 63 y.o.   MRN: [id number]  HPI  Larry Callahan is a 63 y/o male with a history of  Echo report from 12/26/2018 reviewed and showed an EF of >50%.  Was in the ED 02/20/2019 due to shortness of breath. CTA negative for PE. Troponins negative. Given Lasix and released. Was in the ED 12/29/2018 due to shortness of breath where he was treated and released.   He presents today for his initial visit with a chief complaint of   Review of Systems    Physical Exam    Assessment & Plan:  1: Chronic heart failure with preserved ejection fraction- - NYHA class  2: HTN- - BP - saw PCP Versie Starks) 02/20/2019 - BMP 02/20/2019 reviewed and showed sodium 140, potassium 4.3, creatinine 1.58 and GFR 45  3: DM- - A1c 11/23/2018 was 7.8%

## 2019-02-25 ENCOUNTER — Other Ambulatory Visit: Payer: Self-pay | Admitting: Nurse Practitioner

## 2019-02-25 ENCOUNTER — Other Ambulatory Visit: Payer: Self-pay

## 2019-02-25 ENCOUNTER — Ambulatory Visit: Payer: Medicare PPO | Admitting: Family

## 2019-02-25 ENCOUNTER — Telehealth: Payer: Self-pay

## 2019-02-25 DIAGNOSIS — R0602 Shortness of breath: Secondary | ICD-10-CM

## 2019-02-25 DIAGNOSIS — I5189 Other ill-defined heart diseases: Secondary | ICD-10-CM

## 2019-02-25 DIAGNOSIS — E876 Hypokalemia: Secondary | ICD-10-CM

## 2019-02-25 MED ORDER — POTASSIUM CHLORIDE ER 10 MEQ PO TBCR: 10.0000 meq | EXTENDED_RELEASE_TABLET | Freq: Two times a day (BID) | ORAL | 0 refills | Status: DC

## 2019-02-25 MED ORDER — FUROSEMIDE 20 MG PO TABS: 20.0000 mg | ORAL_TABLET | Freq: Every day | ORAL | 0 refills | Status: DC

## 2019-02-25 NOTE — Telephone Encounter (Signed)
Sent thirty day prescriptions of furosemide 20mg  daily and potassium 54mEq twice daily to local pharmacy.

## 2019-02-25 NOTE — Progress Notes (Signed)
Sent thirty day prescriptions of furosemide 20mg  daily and potassium 7mEq twice daily to local pharmacy.

## 2019-02-26 NOTE — Telephone Encounter (Signed)
lmom 

## 2019-02-26 NOTE — Telephone Encounter (Signed)
Patient called back and informed them of refill being sent to pharmacy.

## 2019-02-28 ENCOUNTER — Other Ambulatory Visit: Payer: Self-pay | Admitting: Nurse Practitioner

## 2019-02-28 MED ORDER — METOPROLOL SUCCINATE ER 50 MG PO TB24: 50.0000 mg | ORAL_TABLET | Freq: Every day | ORAL | 1 refills | Status: DC

## 2019-02-28 MED ORDER — GEMFIBROZIL 600 MG PO TABS: 600.0000 mg | ORAL_TABLET | Freq: Two times a day (BID) | ORAL | 1 refills | Status: DC

## 2019-02-28 MED ORDER — MELOXICAM 7.5 MG PO TABS: ORAL_TABLET | ORAL | 1 refills | Status: DC

## 2019-02-28 MED ORDER — ATORVASTATIN CALCIUM 10 MG PO TABS: 10.0000 mg | ORAL_TABLET | Freq: Every day | ORAL | 1 refills | Status: DC

## 2019-03-01 ENCOUNTER — Other Ambulatory Visit: Payer: Self-pay

## 2019-03-01 ENCOUNTER — Inpatient Hospital Stay
Admission: EM | Admit: 2019-03-01 | Discharge: 2019-03-05 | DRG: 291 | Disposition: A | Discharge status: Home Health | Payer: Medicare PPO | Attending: Internal Medicine | Admitting: Internal Medicine

## 2019-03-01 ENCOUNTER — Emergency Department: Payer: Medicare PPO

## 2019-03-01 ENCOUNTER — Telehealth: Payer: Self-pay | Admitting: Cardiovascular Disease

## 2019-03-01 DIAGNOSIS — R42 Dizziness and giddiness: Secondary | ICD-10-CM | POA: Diagnosis not present

## 2019-03-01 DIAGNOSIS — E118 Type 2 diabetes mellitus with unspecified complications: Secondary | ICD-10-CM

## 2019-03-01 DIAGNOSIS — I272 Pulmonary hypertension, unspecified: Secondary | ICD-10-CM | POA: Diagnosis present

## 2019-03-01 DIAGNOSIS — I5033 Acute on chronic diastolic (congestive) heart failure: Secondary | ICD-10-CM | POA: Diagnosis not present

## 2019-03-01 DIAGNOSIS — R079 Chest pain, unspecified: Secondary | ICD-10-CM | POA: Diagnosis not present

## 2019-03-01 DIAGNOSIS — Z905 Acquired absence of kidney: Secondary | ICD-10-CM

## 2019-03-01 DIAGNOSIS — K219 Gastro-esophageal reflux disease without esophagitis: Secondary | ICD-10-CM | POA: Diagnosis present

## 2019-03-01 DIAGNOSIS — N183 Chronic kidney disease, stage 3 (moderate): Secondary | ICD-10-CM | POA: Diagnosis present

## 2019-03-01 DIAGNOSIS — Z7982 Long term (current) use of aspirin: Secondary | ICD-10-CM | POA: Diagnosis not present

## 2019-03-01 DIAGNOSIS — I248 Other forms of acute ischemic heart disease: Secondary | ICD-10-CM | POA: Diagnosis not present

## 2019-03-01 DIAGNOSIS — J441 Chronic obstructive pulmonary disease with (acute) exacerbation: Secondary | ICD-10-CM | POA: Diagnosis present

## 2019-03-01 DIAGNOSIS — Z833 Family history of diabetes mellitus: Secondary | ICD-10-CM

## 2019-03-01 DIAGNOSIS — E782 Mixed hyperlipidemia: Secondary | ICD-10-CM | POA: Diagnosis present

## 2019-03-01 DIAGNOSIS — E1165 Type 2 diabetes mellitus with hyperglycemia: Secondary | ICD-10-CM | POA: Diagnosis present

## 2019-03-01 DIAGNOSIS — N179 Acute kidney failure, unspecified: Secondary | ICD-10-CM | POA: Diagnosis present

## 2019-03-01 DIAGNOSIS — R069 Unspecified abnormalities of breathing: Secondary | ICD-10-CM | POA: Diagnosis not present

## 2019-03-01 DIAGNOSIS — Z85528 Personal history of other malignant neoplasm of kidney: Secondary | ICD-10-CM | POA: Diagnosis not present

## 2019-03-01 DIAGNOSIS — I5022 Chronic systolic (congestive) heart failure: Secondary | ICD-10-CM

## 2019-03-01 DIAGNOSIS — Z20828 Contact with and (suspected) exposure to other viral communicable diseases: Secondary | ICD-10-CM | POA: Diagnosis present

## 2019-03-01 DIAGNOSIS — E785 Hyperlipidemia, unspecified: Secondary | ICD-10-CM | POA: Diagnosis present

## 2019-03-01 DIAGNOSIS — Z880 Allergy status to penicillin: Secondary | ICD-10-CM

## 2019-03-01 DIAGNOSIS — J301 Allergic rhinitis due to pollen: Secondary | ICD-10-CM | POA: Diagnosis present

## 2019-03-01 DIAGNOSIS — R0602 Shortness of breath: Secondary | ICD-10-CM | POA: Diagnosis not present

## 2019-03-01 DIAGNOSIS — I10 Essential (primary) hypertension: Secondary | ICD-10-CM | POA: Diagnosis not present

## 2019-03-01 DIAGNOSIS — Z9049 Acquired absence of other specified parts of digestive tract: Secondary | ICD-10-CM | POA: Diagnosis not present

## 2019-03-01 DIAGNOSIS — R0603 Acute respiratory distress: Secondary | ICD-10-CM | POA: Diagnosis present

## 2019-03-01 DIAGNOSIS — Z79899 Other long term (current) drug therapy: Secondary | ICD-10-CM

## 2019-03-01 DIAGNOSIS — I130 Hypertensive heart and chronic kidney disease with heart failure and stage 1 through stage 4 chronic kidney disease, or unspecified chronic kidney disease: Principal | ICD-10-CM | POA: Diagnosis present

## 2019-03-01 DIAGNOSIS — D631 Anemia in chronic kidney disease: Secondary | ICD-10-CM | POA: Diagnosis present

## 2019-03-01 DIAGNOSIS — I509 Heart failure, unspecified: Secondary | ICD-10-CM

## 2019-03-01 DIAGNOSIS — E892 Postprocedural hypoparathyroidism: Secondary | ICD-10-CM | POA: Diagnosis present

## 2019-03-01 DIAGNOSIS — R072 Precordial pain: Secondary | ICD-10-CM | POA: Diagnosis not present

## 2019-03-01 DIAGNOSIS — Z794 Long term (current) use of insulin: Secondary | ICD-10-CM

## 2019-03-01 DIAGNOSIS — I2510 Atherosclerotic heart disease of native coronary artery without angina pectoris: Secondary | ICD-10-CM | POA: Diagnosis not present

## 2019-03-01 DIAGNOSIS — Z6841 Body Mass Index (BMI) 40.0 and over, adult: Secondary | ICD-10-CM

## 2019-03-01 DIAGNOSIS — Z9114 Patient's other noncompliance with medication regimen: Secondary | ICD-10-CM

## 2019-03-01 DIAGNOSIS — G4733 Obstructive sleep apnea (adult) (pediatric): Secondary | ICD-10-CM | POA: Diagnosis not present

## 2019-03-01 DIAGNOSIS — N189 Chronic kidney disease, unspecified: Secondary | ICD-10-CM | POA: Diagnosis not present

## 2019-03-01 DIAGNOSIS — N182 Chronic kidney disease, stage 2 (mild): Secondary | ICD-10-CM | POA: Diagnosis not present

## 2019-03-01 DIAGNOSIS — E1122 Type 2 diabetes mellitus with diabetic chronic kidney disease: Secondary | ICD-10-CM | POA: Diagnosis not present

## 2019-03-01 DIAGNOSIS — I25118 Atherosclerotic heart disease of native coronary artery with other forms of angina pectoris: Secondary | ICD-10-CM

## 2019-03-01 DIAGNOSIS — Z801 Family history of malignant neoplasm of trachea, bronchus and lung: Secondary | ICD-10-CM

## 2019-03-01 DIAGNOSIS — Z87891 Personal history of nicotine dependence: Secondary | ICD-10-CM

## 2019-03-01 DIAGNOSIS — I5189 Other ill-defined heart diseases: Secondary | ICD-10-CM

## 2019-03-01 DIAGNOSIS — I5031 Acute diastolic (congestive) heart failure: Secondary | ICD-10-CM

## 2019-03-01 DIAGNOSIS — N1832 Chronic kidney disease, stage 3b: Secondary | ICD-10-CM

## 2019-03-01 DIAGNOSIS — I110 Hypertensive heart disease with heart failure: Secondary | ICD-10-CM

## 2019-03-01 DIAGNOSIS — Z9111 Patient's noncompliance with dietary regimen: Secondary | ICD-10-CM

## 2019-03-01 DIAGNOSIS — Z8249 Family history of ischemic heart disease and other diseases of the circulatory system: Secondary | ICD-10-CM

## 2019-03-01 LAB — GLUCOSE, CAPILLARY
Glucose-Capillary: 73 mg/dL (ref 70–99)
Glucose-Capillary: 131 mg/dL — ABNORMAL HIGH (ref 70–99)

## 2019-03-01 LAB — CBC
HCT: 32.3 % — ABNORMAL LOW (ref 39.0–52.0)
Hemoglobin: 9.7 g/dL — ABNORMAL LOW (ref 13.0–17.0)
MCH: 25.1 pg — ABNORMAL LOW (ref 26.0–34.0)
MCHC: 30 g/dL (ref 30.0–36.0)
MCV: 83.5 fL (ref 80.0–100.0)
Platelets: 314 10*3/uL (ref 150–400)
RBC: 3.87 MIL/uL — ABNORMAL LOW (ref 4.22–5.81)
RDW: 16.2 % — ABNORMAL HIGH (ref 11.5–15.5)
WBC: 8.7 10*3/uL (ref 4.0–10.5)
nRBC: 0 % (ref 0.0–0.2)

## 2019-03-01 LAB — IRON AND TIBC
Iron: 22 ug/dL — ABNORMAL LOW (ref 45–182)
Saturation Ratios: 5 % — ABNORMAL LOW (ref 17.9–39.5)
TIBC: 439 ug/dL (ref 250–450)
UIBC: 417 ug/dL

## 2019-03-01 LAB — BASIC METABOLIC PANEL
Anion gap: 9 (ref 5–15)
BUN: 27 mg/dL — ABNORMAL HIGH (ref 8–23)
CO2: 21 mmol/L — ABNORMAL LOW (ref 22–32)
Calcium: 9.4 mg/dL (ref 8.9–10.3)
Chloride: 112 mmol/L — ABNORMAL HIGH (ref 98–111)
Creatinine, Ser: 1.52 mg/dL — ABNORMAL HIGH (ref 0.61–1.24)
GFR calc Af Amer: 55 mL/min — ABNORMAL LOW (ref >60)
GFR calc non Af Amer: 47 mL/min — ABNORMAL LOW (ref >60)
Glucose, Bld: 121 mg/dL — ABNORMAL HIGH (ref 70–99)
Potassium: 4 mmol/L (ref 3.5–5.1)
Sodium: 142 mmol/L (ref 135–145)

## 2019-03-01 LAB — HEMOGLOBIN A1C
Hgb A1c MFr Bld: 6.5 % — ABNORMAL HIGH (ref 4.8–5.6)
Mean Plasma Glucose: 139.85 mg/dL

## 2019-03-01 LAB — TROPONIN I (HIGH SENSITIVITY)
Troponin I (High Sensitivity): 20 ng/L — ABNORMAL HIGH (ref <18)
Troponin I (High Sensitivity): 21 ng/L — ABNORMAL HIGH (ref <18)

## 2019-03-01 LAB — BRAIN NATRIURETIC PEPTIDE: B Natriuretic Peptide: 322 pg/mL — ABNORMAL HIGH (ref 0.0–100.0)

## 2019-03-01 LAB — FERRITIN: Ferritin: 5 ng/mL — ABNORMAL LOW (ref 24–336)

## 2019-03-01 LAB — SARS CORONAVIRUS 2 BY RT PCR (HOSPITAL ORDER, PERFORMED IN ~~LOC~~ HOSPITAL LAB): SARS Coronavirus 2: NEGATIVE

## 2019-03-01 MED ORDER — SODIUM CHLORIDE 0.9 % IV SOLN: 250.0000 mL | INTRAVENOUS | Status: DC | PRN

## 2019-03-01 MED ORDER — PANTOPRAZOLE SODIUM 40 MG PO TBEC
40.0000 mg | DELAYED_RELEASE_TABLET | Freq: Every day | ORAL | Status: DC
  Administered 2019-03-02 – 2019-03-05 (×4): 40 mg via ORAL
  Filled 2019-03-01 (×4): qty 1

## 2019-03-01 MED ORDER — CLOBETASOL PROPIONATE 0.05 % EX CREA
1.0000 "application " | TOPICAL_CREAM | Freq: Two times a day (BID) | CUTANEOUS | Status: DC | PRN
  Filled 2019-03-01: qty 15

## 2019-03-01 MED ORDER — IPRATROPIUM-ALBUTEROL 0.5-2.5 (3) MG/3ML IN SOLN
3.0000 mL | Freq: Four times a day (QID) | RESPIRATORY_TRACT | Status: DC
  Administered 2019-03-01 – 2019-03-04 (×11): 3 mL via RESPIRATORY_TRACT
  Filled 2019-03-01 (×12): qty 3

## 2019-03-01 MED ORDER — AMLODIPINE BESYLATE 5 MG PO TABS
5.0000 mg | ORAL_TABLET | Freq: Every day | ORAL | Status: DC
  Administered 2019-03-02 – 2019-03-05 (×4): 5 mg via ORAL
  Filled 2019-03-01 (×4): qty 1

## 2019-03-01 MED ORDER — ACETAMINOPHEN 325 MG PO TABS: 650.0000 mg | ORAL_TABLET | ORAL | Status: DC | PRN

## 2019-03-01 MED ORDER — IPRATROPIUM-ALBUTEROL 0.5-2.5 (3) MG/3ML IN SOLN
3.0000 mL | Freq: Once | RESPIRATORY_TRACT | Status: AC
  Administered 2019-03-01: 14:00:00 3 mL via RESPIRATORY_TRACT
  Filled 2019-03-01: qty 3

## 2019-03-01 MED ORDER — AZITHROMYCIN 500 MG PO TABS
250.0000 mg | ORAL_TABLET | Freq: Every day | ORAL | Status: AC
  Administered 2019-03-02 – 2019-03-05 (×4): 250 mg via ORAL
  Filled 2019-03-01 (×3): qty 0.5
  Filled 2019-03-01 (×3): qty 1
  Filled 2019-03-01: qty 0.5
  Filled 2019-03-01: qty 1

## 2019-03-01 MED ORDER — ATORVASTATIN CALCIUM 20 MG PO TABS
10.0000 mg | ORAL_TABLET | Freq: Every day | ORAL | Status: DC
  Administered 2019-03-01 – 2019-03-04 (×4): 10 mg via ORAL
  Filled 2019-03-01 (×4): qty 1

## 2019-03-01 MED ORDER — POTASSIUM CHLORIDE CRYS ER 10 MEQ PO TBCR
10.0000 meq | EXTENDED_RELEASE_TABLET | Freq: Two times a day (BID) | ORAL | Status: DC
  Administered 2019-03-01 – 2019-03-05 (×8): 10 meq via ORAL
  Filled 2019-03-01 (×8): qty 1

## 2019-03-01 MED ORDER — NICOTINE 21 MG/24HR TD PT24
21.0000 mg | MEDICATED_PATCH | Freq: Every day | TRANSDERMAL | Status: DC
  Administered 2019-03-04 – 2019-03-05 (×2): 21 mg via TRANSDERMAL
  Filled 2019-03-01 (×4): qty 1

## 2019-03-01 MED ORDER — INSULIN GLARGINE 100 UNIT/ML ~~LOC~~ SOLN
15.0000 [IU] | Freq: Every evening | SUBCUTANEOUS | Status: DC
  Administered 2019-03-01 – 2019-03-04 (×4): 15 [IU] via SUBCUTANEOUS
  Filled 2019-03-01 (×5): qty 0.15

## 2019-03-01 MED ORDER — ASPIRIN 81 MG PO CHEW
324.0000 mg | CHEWABLE_TABLET | Freq: Once | ORAL | Status: AC
  Administered 2019-03-01: 324 mg via ORAL
  Filled 2019-03-01: qty 4

## 2019-03-01 MED ORDER — FUROSEMIDE 10 MG/ML IJ SOLN
60.0000 mg | Freq: Once | INTRAMUSCULAR | Status: AC
  Administered 2019-03-01: 60 mg via INTRAVENOUS
  Filled 2019-03-01: qty 8

## 2019-03-01 MED ORDER — HYDRALAZINE HCL 25 MG PO TABS
25.0000 mg | ORAL_TABLET | Freq: Three times a day (TID) | ORAL | Status: DC
  Administered 2019-03-01 – 2019-03-05 (×10): 25 mg via ORAL
  Filled 2019-03-01 (×10): qty 1

## 2019-03-01 MED ORDER — AZITHROMYCIN 500 MG PO TABS
500.0000 mg | ORAL_TABLET | Freq: Every day | ORAL | Status: AC
  Administered 2019-03-01: 500 mg via ORAL
  Filled 2019-03-01: qty 2
  Filled 2019-03-01: qty 1

## 2019-03-01 MED ORDER — AMLODIPINE BESY-BENAZEPRIL HCL 5-20 MG PO CAPS: 1.0000 | ORAL_CAPSULE | Freq: Every day | ORAL | Status: DC

## 2019-03-01 MED ORDER — ISOSORBIDE MONONITRATE ER 30 MG PO TB24
30.0000 mg | ORAL_TABLET | Freq: Every day | ORAL | Status: DC
  Administered 2019-03-02 – 2019-03-05 (×4): 30 mg via ORAL
  Filled 2019-03-01 (×4): qty 1

## 2019-03-01 MED ORDER — ENOXAPARIN SODIUM 40 MG/0.4ML ~~LOC~~ SOLN
40.0000 mg | Freq: Two times a day (BID) | SUBCUTANEOUS | Status: DC
  Administered 2019-03-01 – 2019-03-02 (×2): 40 mg via SUBCUTANEOUS
  Filled 2019-03-01 (×2): qty 0.4

## 2019-03-01 MED ORDER — INSULIN ASPART 100 UNIT/ML ~~LOC~~ SOLN
0.0000 [IU] | Freq: Every day | SUBCUTANEOUS | Status: DC
  Administered 2019-03-04: 2 [IU] via SUBCUTANEOUS
  Filled 2019-03-01: qty 1

## 2019-03-01 MED ORDER — BUDESONIDE 0.5 MG/2ML IN SUSP
0.5000 mg | Freq: Two times a day (BID) | RESPIRATORY_TRACT | Status: DC
  Administered 2019-03-01 – 2019-03-05 (×8): 0.5 mg via RESPIRATORY_TRACT
  Filled 2019-03-01 (×8): qty 2

## 2019-03-01 MED ORDER — ASPIRIN EC 81 MG PO TBEC
81.0000 mg | DELAYED_RELEASE_TABLET | Freq: Every day | ORAL | Status: DC
  Administered 2019-03-02 – 2019-03-05 (×4): 81 mg via ORAL
  Filled 2019-03-01 (×4): qty 1

## 2019-03-01 MED ORDER — OMEPRAZOLE 40 MG PO CPDR: 40.0000 mg | DELAYED_RELEASE_CAPSULE | Freq: Every day | ORAL | 1 refills | Status: DC

## 2019-03-01 MED ORDER — BENAZEPRIL HCL 20 MG PO TABS
20.0000 mg | ORAL_TABLET | Freq: Every day | ORAL | Status: DC
  Filled 2019-03-01: qty 1

## 2019-03-01 MED ORDER — ONDANSETRON HCL 4 MG/2ML IJ SOLN: 4.0000 mg | Freq: Four times a day (QID) | INTRAMUSCULAR | Status: DC | PRN

## 2019-03-01 MED ORDER — HYDRALAZINE HCL 10 MG PO TABS
10.0000 mg | ORAL_TABLET | Freq: Three times a day (TID) | ORAL | Status: DC
  Administered 2019-03-01: 10 mg via ORAL
  Filled 2019-03-01 (×3): qty 1

## 2019-03-01 MED ORDER — SODIUM CHLORIDE 0.9% FLUSH
3.0000 mL | Freq: Two times a day (BID) | INTRAVENOUS | Status: DC
  Administered 2019-03-01 – 2019-03-05 (×8): 3 mL via INTRAVENOUS

## 2019-03-01 MED ORDER — SODIUM CHLORIDE 0.9% FLUSH: 3.0000 mL | Freq: Once | INTRAVENOUS | Status: DC

## 2019-03-01 MED ORDER — INSULIN ASPART 100 UNIT/ML ~~LOC~~ SOLN
0.0000 [IU] | Freq: Three times a day (TID) | SUBCUTANEOUS | Status: DC
  Administered 2019-03-02: 2 [IU] via SUBCUTANEOUS
  Administered 2019-03-02: 08:00:00 1 [IU] via SUBCUTANEOUS
  Administered 2019-03-03: 2 [IU] via SUBCUTANEOUS
  Administered 2019-03-03: 1 [IU] via SUBCUTANEOUS
  Administered 2019-03-03: 3 [IU] via SUBCUTANEOUS
  Administered 2019-03-04: 2 [IU] via SUBCUTANEOUS
  Administered 2019-03-04: 1 [IU] via SUBCUTANEOUS
  Administered 2019-03-04: 5 [IU] via SUBCUTANEOUS
  Administered 2019-03-05: 2 [IU] via SUBCUTANEOUS
  Administered 2019-03-05: 5 [IU] via SUBCUTANEOUS
  Filled 2019-03-01 (×10): qty 1

## 2019-03-01 MED ORDER — METOPROLOL SUCCINATE ER 50 MG PO TB24
50.0000 mg | ORAL_TABLET | Freq: Every day | ORAL | Status: DC
  Administered 2019-03-02 – 2019-03-04 (×3): 50 mg via ORAL
  Filled 2019-03-01 (×3): qty 1

## 2019-03-01 MED ORDER — SODIUM CHLORIDE 0.9% FLUSH: 3.0000 mL | INTRAVENOUS | Status: DC | PRN

## 2019-03-01 MED ORDER — FERROUS GLUCONATE 324 (38 FE) MG PO TABS
324.0000 mg | ORAL_TABLET | Freq: Every day | ORAL | Status: DC
  Administered 2019-03-02 – 2019-03-05 (×4): 324 mg via ORAL
  Filled 2019-03-01 (×4): qty 1

## 2019-03-01 MED ORDER — FUROSEMIDE 10 MG/ML IJ SOLN
40.0000 mg | Freq: Two times a day (BID) | INTRAMUSCULAR | Status: DC
  Administered 2019-03-01: 40 mg via INTRAVENOUS
  Filled 2019-03-01: qty 4

## 2019-03-01 MED ORDER — GEMFIBROZIL 600 MG PO TABS
600.0000 mg | ORAL_TABLET | Freq: Two times a day (BID) | ORAL | Status: DC
  Administered 2019-03-01 – 2019-03-05 (×7): 600 mg via ORAL
  Filled 2019-03-01 (×9): qty 1

## 2019-03-01 NOTE — Consult Note (Signed)
Cardiology Consultation:   Patient ID: Larry Callahan MRN: [id number]; DOB: 04-14-1956  Admit date: 03/01/2019 Date of Consult: 03/01/2019  Primary Care Provider: [name], NP Primary Cardiologist: new to [facility] Physician requesting consult: Dr. Loletha Grayer Reason for consult: Chest pain, shortness of breath   Patient Profile:   Larry Callahan is a 63 y.o. male with a hx of morbid obesity, hypertension poorly controlled, type 2 diabetes smoker, chronic renal insufficiency, single kidney, obstructive sleep apnea on CPAP,  morbid obesity, presenting to the emergency room with shortness of breath on exertion  History of Present Illness:   Larry Callahan has had several recent trips to the emergency room for similar symptoms of shortness of breath on exertion  Recent work-up February 20, 2019 for shortness of breath, records reviewed, cardiac enzymes negative at that time, EKG nonacute  Was seen December 29, 2018 for similar symptoms of shortness of breath on exertion, at that time hemoglobin 9.6, creatinine 1.63  He reports that symptoms are worse when he is supine Some noncompliance with his diuretic at home, does not like to urinate.  Diet noncompliance Does not check his blood pressure at home.  Reports it is typically elevated  This morning with intermittent midsternal nonradiating chest pain No fevers  Recent echocardiogram July 2020 Report not in the computer Per primary care office notes had normal LV function  It appears he had left heart catheterization 2011 Nonobstructive disease at that time Images reviewed personally by myself Nondominant RCA  Heart Pathway Score:     Past Medical History:  Diagnosis Date  . Allergy   . Coronary artery disease   . Diabetes mellitus without complication ([place])   . GERD (gastroesophageal reflux disease)   . Hyperlipidemia   . Hypertension   . Sleep apnea     Past Surgical History:  Procedure  Laterality Date  . BACK SURGERY    . CHOLECYSTECTOMY    . left arm surgery    . nephrectomy Left   . PARATHYROIDECTOMY       Home Medications:  Prior to Admission medications   Medication Sig Start Date End Date Taking? Authorizing Provider  amLODipine-benazepril (LOTREL) 5-20 MG capsule TAKE  (1)  CAPSULE  TWICE DAILY. 09/28/18  Yes [name], NP  aspirin EC 81 MG tablet Take 81 mg by mouth daily.   Yes [provider]  atorvastatin (LIPITOR) 10 MG tablet Take 1 tablet (10 mg total) by mouth daily. 02/28/19  Yes [name], NP  ferrous gluconate (FERGON) 324 MG tablet Take 1 tablet (324 mg total) by mouth daily with breakfast. 12/31/18  Yes Boscia, Heather E, NP  furosemide (LASIX) 20 MG tablet Take 1 tablet (20 mg total) by mouth daily with breakfast. 02/25/19 03/27/19 Yes Boscia, Heather E, NP  gemfibrozil (LOPID) 600 MG tablet Take 1 tablet (600 mg total) by mouth 2 (two) times daily with a meal. 02/28/19  Yes Boscia, Heather E, NP  glyBURIDE-metformin (GLUCOVANCE) 5-500 MG tablet 2 with breakfast and 2 with dinner 01/08/19  Yes Boscia, Heather E, NP  hydrALAZINE (APRESOLINE) 10 MG tablet Take 1 tablet (10 mg total) by mouth 3 (three) times daily. 12/31/18  Yes Boscia, Heather E, NP  Insulin Glargine, 1 Unit Dial, (TOUJEO SOLOSTAR) 300 UNIT/ML SOPN Inject 15 Units into the skin every evening. 11/23/18 03/01/19 Yes Boscia, Heather E, NP  lisinopril (ZESTRIL) 5 MG tablet Take 1 tablet (5 mg total) by mouth daily. 11/23/18  Yes Boscia,  Heather E, NP  metoprolol succinate (TOPROL-XL) 50 MG 24 hr tablet Take 1 tablet (50 mg total) by mouth at bedtime. Take with or immediately following a meal. 02/28/19  Yes Boscia, Heather E, NP  omeprazole (PRILOSEC) 40 MG capsule Take 1 capsule (40 mg total) by mouth daily. 03/01/19  Yes [place], Audie Clear, NP  potassium chloride (K-DUR) 10 MEQ tablet Take 1 tablet (10 mEq total) by mouth 2 (two) times daily. 02/25/19 03/27/19 Yes [name],  NP  Alcohol Swabs (B-D SINGLE USE SWABS BUTTERFLY) PADS Use as directed twice a daily diag e11.65 04/04/18   [name], NP  Alcohol Swabs PADS 1 each. 04/04/18   [provider]  Blood Glucose Calibration (TRUE METRIX LEVEL 1) Low SOLN Use as directed 04/04/18   [name], NP  Blood Glucose Monitoring Suppl (TRUE METRIX AIR GLUCOSE METER) DEVI 1 Device by Does not apply route 2 (two) times daily. 04/13/18   [name], NP  clobetasol cream (TEMOVATE) 3.32 % Apply 1 application topically 2 (two) times daily. 12/31/18   [name], NP  glucose blood (TRUE METRIX BLOOD GLUCOSE TEST) test strip Use as instructed twice daily diag E11.65 11/19/18   [name], NP  meloxicam (MOBIC) 7.5 MG tablet TAKE 1 TABLET TWICE DAILY AS NEEDED FOR  JOINT  PAIN 02/28/19   [name], NP  nicotine (NICODERM CQ - DOSED IN MG/24 HOURS) 21 mg/24hr patch Place 1 patch (21 mg total) onto the skin daily. 12/31/18   [name], NP  NOVOFINE 32G X 6 MM MISC To use with [place] injections daily 12/13/18   [name], NP  triamcinolone ointment (KENALOG) 0.1 % Apply 1 application topically 2 (two) times daily. 11/23/18   [name], NP  TRUEPLUS LANCETS 33G MISC Use as directed twice daily diag E11.65 04/04/18   [name], NP    Inpatient Medications: Scheduled Meds: . [START ON 03/02/2019] amLODipine  5 mg Oral Daily   And  . [START ON 03/02/2019] benazepril  20 mg Oral Daily  . [START ON 03/02/2019] aspirin EC  81 mg Oral Daily  . atorvastatin  10 mg Oral q1800  . [START ON 03/02/2019] azithromycin  250 mg Oral Daily  . budesonide (PULMICORT) nebulizer solution  0.5 mg Nebulization BID  . enoxaparin (LOVENOX) injection  40 mg Subcutaneous Q12H  . [START ON 03/02/2019] ferrous gluconate  324 mg Oral Q breakfast  . furosemide  40 mg Intravenous BID  . gemfibrozil  600 mg Oral BID WC  . hydrALAZINE  10 mg Oral TID  . insulin aspart  0-5 Units Subcutaneous  QHS  . insulin aspart  0-9 Units Subcutaneous TID WC  . insulin glargine  15 Units Subcutaneous QPM  . ipratropium-albuterol  3 mL Nebulization Q6H  . metoprolol succinate  50 mg Oral QHS  . nicotine  21 mg Transdermal Daily  . [START ON 03/02/2019] pantoprazole  40 mg Oral Daily  . potassium chloride  10 mEq Oral BID  . sodium chloride flush  3 mL Intravenous Once  . sodium chloride flush  3 mL Intravenous Q12H   Continuous Infusions: . sodium chloride     PRN Meds: sodium chloride, acetaminophen, clobetasol cream, ondansetron (ZOFRAN) IV, sodium chloride flush  Allergies:    Allergies  Allergen Reactions  . Penicillins     Social History:   Social History   Socioeconomic History  . Marital status: Single  Spouse name: Not on file  . Number of children: Not on file  . Years of education: Not on file  . Highest education level: Not on file  Occupational History  . Not on file  Social Needs  . Financial resource strain: Not on file  . Food insecurity    Worry: Not on file    Inability: Not on file  . Transportation needs    Medical: Not on file    Non-medical: Not on file  Tobacco Use  . Smoking status: Former Research scientist (life sciences)  . Smokeless tobacco: Former Systems developer    Types: Chew  Substance and Sexual Activity  . Alcohol use: No    Frequency: Never  . Drug use: No  . Sexual activity: Not on file  Lifestyle  . Physical activity    Days per week: Not on file    Minutes per session: Not on file  . Stress: Not on file  Relationships  . Social Herbalist on phone: Not on file    Gets together: Not on file    Attends religious service: Not on file    Active member of club or organization: Not on file    Attends meetings of clubs or organizations: Not on file    Relationship status: Not on file  . Intimate partner violence    Fear of current or ex partner: Not on file    Emotionally abused: Not on file    Physically abused: Not on file    Forced sexual  activity: Not on file  Other Topics Concern  . Not on file  Social History Narrative  . Not on file    Family History:    Family History  Problem Relation Age of Onset  . Diabetes Mother   . Lung cancer Father   . Diabetes Sister   . Hypertension Sister   . Heart disease Sister   . Cancer Sister   . Bone cancer Brother      ROS:  Please see the history of present illness.  Review of Systems  Constitutional: Negative.   HENT: Negative.   Respiratory: Positive for shortness of breath.   Cardiovascular: Positive for chest pain and leg swelling.  Gastrointestinal: Negative.   Musculoskeletal: Negative.   Neurological: Negative.   Psychiatric/Behavioral: Negative.   All other systems reviewed and are negative.    Physical Exam/Data:   Vitals:   03/01/19 1600 03/01/19 1643 03/01/19 1800 03/01/19 1824  BP: (!) 177/94 (!) 173/102  (!) 160/83  Pulse: 61 71    Resp: 15 19 16    Temp:  98.3 F (36.8 C)    TempSrc:      SpO2: 99% 93%    Weight:      Height:        Intake/Output Summary (Last 24 hours) at 03/01/2019 1833 Last data filed at 03/01/2019 1826 Gross per 24 hour  Intake -  Output 175 ml  Net -175 ml   Last 3 Weights 03/01/2019 02/20/2019 12/31/2018  Weight (lbs) 287 lb 11.2 oz 287 lb 11.2 oz 287 lb 9.6 oz  Weight (kg) 130.5 kg 130.5 kg 130.455 kg     Body mass index is 40.13 kg/m.  General:  Well nourished, well developed, in no acute distress HEENT: normal Lymph: no adenopathy Neck: Unable to estimate JVD Endocrine:  No thryomegaly Vascular: No carotid bruits; FA pulses 2+ bilaterally without bruits  Cardiac:  normal S1, S2; RRR; no murmur  1+ pitting edema  to below the knees bilaterally Lungs: Decreased breath sounds, scattered Rales Abd: soft, nontender, no hepatomegaly  Ext: no edema Musculoskeletal:  No deformities, BUE and BLE strength normal and equal Skin: warm and dry  Neuro:  CNs 2-12 intact, no focal abnormalities noted Psych:  Normal  affect   EKG:  The EKG was personally reviewed and demonstrates: EKG personally reviewed by myself showing normal sinus rhythm, no significant ST or T wave changes Telemetry:  Telemetry was personally reviewed and demonstrates: Normal sinus rhythm  Relevant CV Studies: Echocardiogram pending  Laboratory Data:  High Sensitivity Troponin:   Recent Labs  Lab 02/20/19 1654 02/20/19 1847 03/01/19 0956 03/01/19 1712  TROPONINIHS 14 17 20* 21*     Chemistry Recent Labs  Lab 03/01/19 0956  NA 142  K 4.0  CL 112*  CO2 21*  GLUCOSE 121*  BUN 27*  CREATININE 1.52*  CALCIUM 9.4  GFRNONAA 47*  GFRAA 55*  ANIONGAP 9    No results for input(s): PROT, ALBUMIN, AST, ALT, ALKPHOS, BILITOT in the last 168 hours. Hematology Recent Labs  Lab 03/01/19 0956  WBC 8.7  RBC 3.87*  HGB 9.7*  HCT 32.3*  MCV 83.5  MCH 25.1*  MCHC 30.0  RDW 16.2*  PLT 314   BNP Recent Labs  Lab 03/01/19 0956  BNP 322.0*    DDimer No results for input(s): DDIMER in the last 168 hours.   Radiology/Studies:  Dg Chest 2 View  Result Date: 03/01/2019 CLINICAL DATA:  Patient with shortness of breath. EXAM: CHEST - 2 VIEW COMPARISON:  Chest radiograph February 20, 2019 FINDINGS: Stable cardiac and mediastinal contours. Aortic tortuosity. Pulmonary vascular redistribution and mild interstitial opacities. Additionally, there is patchy consolidation within the right lower lung. No pleural effusion or pneumothorax. Thoracic spine degenerative changes. IMPRESSION: Patchy consolidation right mid and lower lung may represent atelectasis or infection. Cardiomegaly with pulmonary vascular redistribution. Electronically Signed   By: Lovey Newcomer M.D.   On: 03/01/2019 10:26    Assessment and Plan:   1.  Acute respiratory distress Likely multifactorial,  Likely has hypertensive heart disease with diastolic CHF, Also with underlying COPD given long history of smoking Also with morbid obesity, BNP 322 -He does  have edema, abdominal fullness consistent with fluid retention -Agree with IV Lasix twice daily, monitoring of renal function given single kidney -Aggressive risk factor modification, need to work on his hypertension -Unable to exclude underlying ischemia, plan as below  2) chest discomfort Cardiac enzymes negative x2, CT scan chest with no PE but He does have significant coronary calcification through the LAD Unable to exclude underlying ischemia, unable several risk factors including poorly controlled diabetes, continued smoking, hyperlipidemia -Recommend pharmacological Myoview tomorrow morning  3.  Essential hypertension Up to 782 systolic in the emergency room Unclear if he is compliant with his medications Home medication list details amlodipine/benazepril 5/20 mg twice daily, Lasix 20 daily, hydralazine 10 mg 3 times daily, metoprolol succinate 50 daily Unclear why he also has lisinopril 5 listed on his home list, likely error -We will add Imdur 30 daily, increase hydralazine up to 25 3 times daily Additional options include higher dose hydralazine, higher dose Imdur, clonidine  4.  Type 2 diabetes poorly controlled Hemoglobin A1c pending, previously measured 7.8 in June 2020 Appears to be trending higher over the past year up from 6.19 January 2018 He does endorse poor diet He may benefit from meeting with a nutritionist  5.  Chronic renal sufficiency Reports single  kidney, previously with surgical resection Creatinine up to 1.52, very concerning, likely stage III Poorly controlled diabetes, poorly controlled hypertension -Stressed in the importance of aggressive management of the above     Total encounter time more than 110 minutes  Greater than 50% was spent in counseling and coordination of care with the patient   For questions or updates, please contact [place] Please consult [email or web address] for contact info under     Signed, [name], MD  03/01/2019  6:33 PM

## 2019-03-01 NOTE — H&P (Signed)
[facility] [place] NAME: Larry Callahan    MR#:  [id number]  DATE OF BIRTH:  [birth year]  DATE OF ADMISSION:  03/01/2019  PRIMARY CARE PHYSICIAN: [name], NP   REQUESTING/REFERRING PHYSICIAN: Dr. Merlyn Lot  CHIEF COMPLAINT:   Chief Complaint  Patient presents with  . Shortness of Breath    HISTORY OF PRESENT ILLNESS:  Larry Callahan  is a 63 y.o. male with a known history of diabetes and hypertension presents with shortness of breath.  He states he cannot breathe well.  He has come to the ER a few times.  He states he cannot sleep.  He has sleep apnea has been wearing a CPAP.  He does have cough.  His feet are swollen.  Patient states that he was given some pills that make him urinate a lot and he does not like to take them.  Hospitalist services contacted for further evaluation.  PAST MEDICAL HISTORY:   Past Medical History:  Diagnosis Date  . Allergy   . Coronary artery disease   . Diabetes mellitus without complication ([place])   . GERD (gastroesophageal reflux disease)   . Hyperlipidemia   . Hypertension   . Sleep apnea     PAST SURGICAL HISTORY:   Past Surgical History:  Procedure Laterality Date  . BACK SURGERY    . CHOLECYSTECTOMY    . left arm surgery    . nephrectomy Left   . PARATHYROIDECTOMY      SOCIAL HISTORY:   Social History   Tobacco Use  . Smoking status: Former Research scientist (life sciences)  . Smokeless tobacco: Former Systems developer    Types: Chew  Substance Use Topics  . Alcohol use: No    Frequency: Never    FAMILY HISTORY:   Family History  Problem Relation Age of Onset  . Diabetes Mother   . Lung cancer Father   . Diabetes Sister   . Hypertension Sister   . Heart disease Sister   . Cancer Sister   . Bone cancer Brother     DRUG ALLERGIES:   Allergies  Allergen Reactions  . Penicillins     REVIEW OF SYSTEMS:  CONSTITUTIONAL: No fever, fatigue or weakness.  EYES: No blurred or double  vision.  EARS, NOSE, AND THROAT: No tinnitus or ear pain. No sore throat RESPIRATORY: Positive for cough, positive for shortness of breath.  No wheezing or hemoptysis.  CARDIOVASCULAR: No chest pain, positive for orthopnea and edema.  GASTROINTESTINAL: No nausea, vomiting, diarrhea or abdominal pain. No blood in bowel movements GENITOURINARY: No dysuria, hematuria.  ENDOCRINE: No polyuria, nocturia,  HEMATOLOGY: No anemia, easy bruising or bleeding SKIN: No rash or lesion. MUSCULOSKELETAL: No joint pain or arthritis.   NEUROLOGIC: No tingling, numbness, weakness.  PSYCHIATRY: No anxiety or depression.   MEDICATIONS AT HOME:   Prior to Admission medications   Medication Sig Start Date End Date Taking? Authorizing Provider  Alcohol Swabs (B-D SINGLE USE SWABS BUTTERFLY) PADS Use as directed twice a daily diag e11.65 04/04/18   [name], NP  Alcohol Swabs PADS 1 each. 04/04/18   [provider]  amLODipine-benazepril (LOTREL) 5-20 MG capsule TAKE  (1)  CAPSULE  TWICE DAILY. 09/28/18   [name], NP  aspirin EC 81 MG tablet Take 81 mg by mouth daily.    [provider]  atorvastatin (LIPITOR) 10 MG tablet Take 1 tablet (10 mg total) by mouth daily. 02/28/19   Leretha Pol  E, NP  Blood Glucose Calibration (TRUE METRIX LEVEL 1) Low SOLN Use as directed 04/04/18   [name], NP  Blood Glucose Monitoring Suppl (TRUE METRIX AIR GLUCOSE METER) DEVI 1 Device by Does not apply route 2 (two) times daily. 04/13/18   [name], NP  clobetasol cream (TEMOVATE) 5.57 % Apply 1 application topically 2 (two) times daily. 12/31/18   [name], NP  ferrous gluconate (FERGON) 324 MG tablet Take 1 tablet (324 mg total) by mouth daily with breakfast. 12/31/18   [name], NP  furosemide (LASIX) 20 MG tablet Take 1 tablet (20 mg total) by mouth daily with breakfast. 02/25/19 03/27/19  [name], NP  gemfibrozil (LOPID) 600 MG tablet Take 1  tablet (600 mg total) by mouth 2 (two) times daily with a meal. 02/28/19   [name], NP  glucose blood (TRUE METRIX BLOOD GLUCOSE TEST) test strip Use as instructed twice daily diag E11.65 11/19/18   [name], NP  glyBURIDE-metformin (GLUCOVANCE) 5-500 MG tablet 2 with breakfast and 2 with dinner 01/08/19   [name], NP  hydrALAZINE (APRESOLINE) 10 MG tablet Take 1 tablet (10 mg total) by mouth 3 (three) times daily. 12/31/18   [name], NP  Insulin Glargine, 1 Unit Dial, (TOUJEO SOLOSTAR) 300 UNIT/ML SOPN Inject 15 Units into the skin every evening. 11/23/18 02/21/19  [name], NP  lisinopril (ZESTRIL) 5 MG tablet Take 1 tablet (5 mg total) by mouth daily. 11/23/18   [name], NP  meloxicam (MOBIC) 7.5 MG tablet TAKE 1 TABLET TWICE DAILY AS NEEDED FOR  JOINT  PAIN 02/28/19   [name], NP  metoprolol succinate (TOPROL-XL) 50 MG 24 hr tablet Take 1 tablet (50 mg total) by mouth at bedtime. Take with or immediately following a meal. 02/28/19   Boscia, Greer Ee, NP  nicotine (NICODERM CQ - DOSED IN MG/24 HOURS) 21 mg/24hr patch Place 1 patch (21 mg total) onto the skin daily. 12/31/18   [name], NP  NOVOFINE 32G X 6 MM MISC To use with [place] injections daily 12/13/18   [name], NP  omeprazole (PRILOSEC) 40 MG capsule Take 1 capsule (40 mg total) by mouth daily. 03/01/19   Kendell Bane, NP  potassium chloride (K-DUR) 10 MEQ tablet Take 1 tablet (10 mEq total) by mouth 2 (two) times daily. 02/25/19 03/27/19  [name], NP  triamcinolone ointment (KENALOG) 0.1 % Apply 1 application topically 2 (two) times daily. 11/23/18   [name], NP  TRUEPLUS LANCETS 33G MISC Use as directed twice daily diag E11.65 04/04/18   [name], NP      VITAL SIGNS:  Blood pressure (!) 190/100, pulse 77, temperature 98.9 F (37.2 C), temperature source Oral, resp. rate (!) 22, height 5\' 11"  (1.803 m), weight 130.5 kg, SpO2 100  %.  PHYSICAL EXAMINATION:  GENERAL:  63 y.o.-year-old patient lying in the bed with no acute distress.  EYES: Pupils equal, round, reactive to light and accommodation. No scleral icterus. Extraocular muscles intact.  HEENT: Head atraumatic, normocephalic. Oropharynx and nasopharynx clear.  NECK:  Supple, positive for jugular venous distention. No thyroid enlargement, no tenderness.  LUNGS: Decreased breath sounds bilaterally, upper airway wheezing, lower airway rales. No use of accessory muscles of respiration.  CARDIOVASCULAR: S1, S2 normal. No murmurs, rubs, or gallops.  ABDOMEN: Soft, nontender, distended. Bowel sounds present. No organomegaly or mass.  EXTREMITIES: 2+  pedal edema, no cyanosis, or clubbing.  NEUROLOGIC: Cranial nerves II through XII are intact. Muscle strength 5/5 in all extremities. Sensation intact. Gait not checked.  PSYCHIATRIC: The patient is alert and oriented x 3.  SKIN: No rash, lesion, or ulcer.   LABORATORY PANEL:   CBC Recent Labs  Lab 03/01/19 0956  WBC 8.7  HGB 9.7*  HCT 32.3*  PLT 314   ------------------------------------------------------------------------------------------------------------------  Chemistries  Recent Labs  Lab 03/01/19 0956  NA 142  K 4.0  CL 112*  CO2 21*  GLUCOSE 121*  BUN 27*  CREATININE 1.52*  CALCIUM 9.4   ------------------------------------------------------------------------------------------------------------------  Cardiac Enzymes High-sensitivity troponin 20  RADIOLOGY:  Dg Chest 2 View  Result Date: 03/01/2019 CLINICAL DATA:  Patient with shortness of breath. EXAM: CHEST - 2 VIEW COMPARISON:  Chest radiograph February 20, 2019 FINDINGS: Stable cardiac and mediastinal contours. Aortic tortuosity. Pulmonary vascular redistribution and mild interstitial opacities. Additionally, there is patchy consolidation within the right lower lung. No pleural effusion or pneumothorax. Thoracic spine degenerative  changes. IMPRESSION: Patchy consolidation right mid and lower lung may represent atelectasis or infection. Cardiomegaly with pulmonary vascular redistribution. Electronically Signed   By: Lovey Newcomer M.D.   On: 03/01/2019 10:26    EKG:   Normal sinus rhythm 83 bpm flattening of T waves laterally  IMPRESSION AND PLAN:   1.  Acute on chronic diastolic congestive heart failure.  Echocardiogram done a few months back showed a normal ejection fraction.  Will diurese with Lasix 40 mg IV twice daily.  Patient already on benazepril and Toprol.  ER physician consulted cardiology. 2.  Accelerated hypertension.  Restart medications and continue to follow. 3.  Mild COPD exacerbation.  Start nebulizer treatments and try to hold off on systemic steroids. 4.  Chronic kidney disease stage III.  Monitor closely with diuresis 5.  Obstructive sleep apnea and morbid obesity.  Weight loss needed for BMI of 40.13.  CPAP at night. 6.  Type 2 diabetes mellitus put on sliding scale.  Continue long-acting insulin at night. 7.  Anemia.  Add on iron studies.  Guaiac stools. 8.  Patient had his kidney removed because of kidney cancer.    All the records are reviewed and case discussed with ED provider. Management plans discussed with the patient, and he is in agreement.  CODE STATUS: full code  TOTAL TIME TAKING CARE OF THIS PATIENT: 50 minutes.    Loletha Grayer M.D on 03/01/2019 at 3:38 PM  Between 7am to 6pm - Pager - [phone]  After 6pm call admission pager [phone]  [facility]  [phone]  CC: Primary care physician; [name], NP

## 2019-03-01 NOTE — Telephone Encounter (Signed)
Patients sister is calling in stating that patient is going to be going to ED per EMS for heart issues. Patient sister wants Dr. Rockey Situ to see him while in 12, sister was made aware that he cannot go unless there is an order in from hospital doctor.

## 2019-03-01 NOTE — ED Notes (Signed)
This RN answered pt's call bell. PT denies any needs at this time.

## 2019-03-01 NOTE — ED Provider Notes (Signed)
[facility] Emergency Department Provider Note    First MD Initiated Contact with Patient 03/01/19 1317     (approximate)  I have reviewed the triage vital signs and the nursing notes.   HISTORY  Chief Complaint Shortness of Breath    HPI Larry Callahan is a 63 y.o. male blows a past medical history presents to the ER for evaluation of shortness of breath.  This been ongoing for several weeks but becoming worse.  Does endorse exertional dyspnea.  Intermittent midsternal nonradiating chest pain.  Denies any intact pain with taking deep inspiration.  No recent fevers.  Has had some nonproductive cough.  Stop smoking just 1 month ago.  Does not recall if he is ever been diagnosed with bronchitis or COPD.    Past Medical History:  Diagnosis Date  . Allergy   . Coronary artery disease   . Diabetes mellitus without complication ([place])   . GERD (gastroesophageal reflux disease)   . Hyperlipidemia   . Hypertension   . Sleep apnea    Family History  Problem Relation Age of Onset  . Diabetes Mother   . Lung cancer Father   . Diabetes Sister   . Hypertension Sister   . Heart disease Sister   . Cancer Sister   . Bone cancer Brother    Past Surgical History:  Procedure Laterality Date  . CHOLECYSTECTOMY    . left arm surgery    . nephrectomy Left   . PARATHYROIDECTOMY     Patient Active Problem List   Diagnosis Date Noted  . CHF (congestive heart failure) ([place]) 03/01/2019  . Diastolic dysfunction 18/56/3149  . Iron deficiency anemia 12/31/2018  . Atopic dermatitis 11/24/2018  . Screening for colon cancer 11/06/2017  . Uncontrolled type 2 diabetes mellitus with hyperglycemia ([place]) 11/06/2017  . Dermatitis, unspecified 06/09/2017  . Atherosclerotic heart disease of native coronary artery without angina pectoris 06/09/2017  . 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 Obstructive sleep apnea, adult 06/09/2017   . Morbid (severe) obesity due to excess calories ([place]) 06/09/2017  . Cigarette nicotine dependence with nicotine-induced disorder 06/09/2017  . Type 2 diabetes mellitus with diabetic polyneuropathy ([place]) 06/09/2017  . Allergic rhinitis due to pollen 06/09/2017  . Mixed hyperlipidemia 06/09/2017  . Chronic obstructive pulmonary disease, unspecified ([place]) 06/09/2017  . Dyspnea on exertion 06/09/2017  . Wheezing 06/09/2017  . Dysuria 06/09/2017  . Snoring 06/09/2017  . Essential hypertension 06/09/2017  . Personal history of other malignant neoplasm of kidney 06/09/2017  . Pain in right hip 06/09/2017  . Impacted cerumen, bilateral 06/09/2017  . Secondary hyperparathyroidism, not elsewhere classified ([place]) 06/09/2017  . Type 2 diabetes mellitus with hyperglycemia ([place]) 06/09/2017  . Tinea corporis 06/09/2017      Prior to Admission medications   Medication Sig Start Date End Date Taking? Authorizing Provider  Alcohol Swabs (B-D SINGLE USE SWABS BUTTERFLY) PADS Use as directed twice a daily diag e11.65 04/04/18   [name], NP  Alcohol Swabs PADS 1 each. 04/04/18   [provider]  amLODipine-benazepril (LOTREL) 5-20 MG capsule TAKE  (1)  CAPSULE  TWICE DAILY. 09/28/18   [name], NP  aspirin EC 81 MG tablet Take 81 mg by mouth daily.    [provider]  atorvastatin (LIPITOR) 10 MG tablet Take 1 tablet (10 mg total) by mouth daily. 02/28/19   [name], NP  Blood Glucose Calibration (TRUE METRIX LEVEL 1) Low  SOLN Use as directed 04/04/18   [name], NP  Blood Glucose Monitoring Suppl (TRUE METRIX AIR GLUCOSE METER) DEVI 1 Device by Does not apply route 2 (two) times daily. 04/13/18   [name], NP  clobetasol cream (TEMOVATE) 5.28 % Apply 1 application topically 2 (two) times daily. 12/31/18   [name], NP  ferrous gluconate (FERGON) 324 MG tablet Take 1 tablet (324 mg total) by mouth daily with breakfast. 12/31/18   [name], NP  furosemide (LASIX) 20 MG tablet Take 1 tablet (20 mg total) by mouth daily with breakfast. 02/25/19 03/27/19  [name], NP  gemfibrozil (LOPID) 600 MG tablet Take 1 tablet (600 mg total) by mouth 2 (two) times daily with a meal. 02/28/19   [name], NP  glucose blood (TRUE METRIX BLOOD GLUCOSE TEST) test strip Use as instructed twice daily diag E11.65 11/19/18   [name], NP  glyBURIDE-metformin (GLUCOVANCE) 5-500 MG tablet 2 with breakfast and 2 with dinner 01/08/19   [name], NP  hydrALAZINE (APRESOLINE) 10 MG tablet Take 1 tablet (10 mg total) by mouth 3 (three) times daily. 12/31/18   [name], NP  Insulin Glargine, 1 Unit Dial, (TOUJEO SOLOSTAR) 300 UNIT/ML SOPN Inject 15 Units into the skin every evening. 11/23/18 02/21/19  [name], NP  lisinopril (ZESTRIL) 5 MG tablet Take 1 tablet (5 mg total) by mouth daily. 11/23/18   [name], NP  meloxicam (MOBIC) 7.5 MG tablet TAKE 1 TABLET TWICE DAILY AS NEEDED FOR  JOINT  PAIN 02/28/19   [name], NP  metoprolol succinate (TOPROL-XL) 50 MG 24 hr tablet Take 1 tablet (50 mg total) by mouth at bedtime. Take with or immediately following a meal. 02/28/19   Boscia, Greer Ee, NP  nicotine (NICODERM CQ - DOSED IN MG/24 HOURS) 21 mg/24hr patch Place 1 patch (21 mg total) onto the skin daily. 12/31/18   [name], NP  NOVOFINE 32G X 6 MM MISC To use with [place] injections daily 12/13/18   [name], NP  omeprazole (PRILOSEC) 40 MG capsule Take 1 capsule (40 mg total) by mouth daily. 03/01/19   Kendell Bane, NP  potassium chloride (K-DUR) 10 MEQ tablet Take 1 tablet (10 mEq total) by mouth 2 (two) times daily. 02/25/19 03/27/19  [name], NP  triamcinolone ointment (KENALOG) 0.1 % Apply 1 application topically 2 (two) times daily. 11/23/18   [name], NP  TRUEPLUS LANCETS 33G MISC Use as directed twice daily diag E11.65 04/04/18   [name], NP     Allergies Penicillins    Social History Social History   Tobacco Use  . Smoking status: Former Research scientist (life sciences)  . Smokeless tobacco: Former Systems developer    Types: Chew  Substance Use Topics  . Alcohol use: No    Frequency: Never  . Drug use: No    Review of Systems Patient denies headaches, rhinorrhea, blurry vision, numbness, shortness of breath, chest pain, edema, cough, abdominal pain, nausea, vomiting, diarrhea, dysuria, fevers, rashes or hallucinations unless otherwise stated above in HPI. ____________________________________________   PHYSICAL EXAM:  VITAL SIGNS: Vitals:   03/01/19 0948 03/01/19 1355  BP: (!) 126/107 (!) 190/100  Pulse: 67 77  Resp: 18 (!) 22  Temp: 98.9 F (37.2 C)   SpO2: 95% 100%    Constitutional: Alert and oriented.  Eyes: Conjunctivae are normal.  Head: Atraumatic. Nose: No congestion/rhinnorhea. Mouth/Throat:  Mucous membranes are moist.   Neck: No stridor. Painless ROM.  Cardiovascular: Normal rate, regular rhythm. Grossly normal heart sounds.  Good peripheral circulation. Respiratory: Normal respiratory effort.  No retractions. Lungs CTAB. Gastrointestinal: Soft and nontender. No distention. No abdominal bruits. No CVA tenderness. Genitourinary:  Musculoskeletal: No lower extremity tenderness nor edema.  No joint effusions. Neurologic:  Normal speech and language. No gross focal neurologic deficits are appreciated. No facial droop Skin:  Skin is warm, dry and intact. No rash noted. Psychiatric: Mood and affect are normal. Speech and behavior are normal.  ____________________________________________   LABS (all labs ordered are listed, but only abnormal results are displayed)  Results for orders placed or performed during the hospital encounter of 03/01/19 (from the past 24 hour(s))  Basic metabolic panel     Status: Abnormal   Collection Time: 03/01/19  9:56 AM  Result Value Ref Range   Sodium 142 135 - 145 mmol/L   Potassium 4.0 3.5 - 5.1  mmol/L   Chloride 112 (H) 98 - 111 mmol/L   CO2 21 (L) 22 - 32 mmol/L   Glucose, Bld 121 (H) 70 - 99 mg/dL   BUN 27 (H) 8 - 23 mg/dL   Creatinine, Ser 1.52 (H) 0.61 - 1.24 mg/dL   Calcium 9.4 8.9 - 10.3 mg/dL   GFR calc non Af Amer 47 (L) >60 mL/min   GFR calc Af Amer 55 (L) >60 mL/min   Anion gap 9 5 - 15  CBC     Status: Abnormal   Collection Time: 03/01/19  9:56 AM  Result Value Ref Range   WBC 8.7 4.0 - 10.5 K/uL   RBC 3.87 (L) 4.22 - 5.81 MIL/uL   Hemoglobin 9.7 (L) 13.0 - 17.0 g/dL   HCT 32.3 (L) 39.0 - 52.0 %   MCV 83.5 80.0 - 100.0 fL   MCH 25.1 (L) 26.0 - 34.0 pg   MCHC 30.0 30.0 - 36.0 g/dL   RDW 16.2 (H) 11.5 - 15.5 %   Platelets 314 150 - 400 K/uL   nRBC 0.0 0.0 - 0.2 %  Troponin I (High Sensitivity)     Status: Abnormal   Collection Time: 03/01/19  9:56 AM  Result Value Ref Range   Troponin I (High Sensitivity) 20 (H) <18 ng/L  Brain natriuretic peptide     Status: Abnormal   Collection Time: 03/01/19  9:56 AM  Result Value Ref Range   B Natriuretic Peptide 322.0 (H) 0.0 - 100.0 pg/mL  SARS Coronavirus 2 [facility] order, Performed in [place] hospital lab) Nasopharyngeal Nasopharyngeal Swab     Status: None   Collection Time: 03/01/19  1:33 PM   Specimen: Nasopharyngeal Swab  Result Value Ref Range   SARS Coronavirus 2 NEGATIVE NEGATIVE   ____________________________________________  EKG My review and personal interpretation at Time: 9:50   Indication: chest pain  Rate: 80  Rhythm: sinus Axis: normal Other: normal intervals, no stemi ____________________________________________  RADIOLOGY  I personally reviewed all radiographic images ordered to evaluate for the above acute complaints and reviewed radiology reports and findings.  These findings were personally discussed with the patient.  Please see medical record for radiology report.  ____________________________________________   PROCEDURES  Procedure(s) performed:  Procedures    Critical  Care performed: no ____________________________________________   INITIAL IMPRESSION / ASSESSMENT AND PLAN / ED COURSE  Pertinent labs & imaging results that were available during my care of the patient were reviewed by me and considered in my  medical decision making (see chart for details).   DDX: Asthma, copd, CHF, pna, ptx, malignancy, Pe, anemia   Amitai Delaughter Deiss is a 62 y.o. who presents to the ED with symptoms as described above.  Patient without any active chest pain at this time but does certainly have multiple risk factors.  Had recent CT angiogram that was negative.  Have a lower suspicion for PE.  Does not have any fever.  Chest x-ray with atelectasis.  Have a lower suspicion for infiltrate.  Have a higher suspicion this is secondary to congestive heart failure.  Clinical Course as of Feb 28 1514  Fri Mar 01, 2019  1455 Is mildly elevated BNP as well as troponin.  Possible worsening of underlying CHF will give Lasix.  Will discuss in consultation with cardiology.  Anticipate patient will require admission.   [PR]    Clinical Course User Index [PR] Merlyn Lot, MD    The patient was evaluated in Emergency Department today for the symptoms described in the history of present illness. He/she was evaluated in the context of the global COVID-19 pandemic, which necessitated consideration that the patient might be at risk for infection with the SARS-CoV-2 virus that causes COVID-19. Institutional protocols and algorithms that pertain to the evaluation of patients at risk for COVID-19 are in a state of rapid change based on information released by regulatory bodies including the CDC and federal and state organizations. These policies and algorithms were followed during the patient's care in the ED.  As part of my medical decision making, I reviewed the following data within the [place] notes reviewed and incorporated, Labs reviewed, notes from prior  ED visits and [place] Controlled Substance Database   ____________________________________________   FINAL CLINICAL IMPRESSION(S) / ED DIAGNOSES  Final diagnoses:  SOB (shortness of breath)  Chest pain, unspecified type      NEW MEDICATIONS STARTED DURING THIS VISIT:  New Prescriptions   No medications on file     Note:  This document was prepared using Dragon voice recognition software and may include unintentional dictation errors.    Merlyn Lot, MD 03/01/19 1515

## 2019-03-01 NOTE — Telephone Encounter (Signed)
Spoke with patients sister per release form and she states that her brother is still in the waiting room and left home at [facility]. Let her know that we are not able to expedite his wait time but that I am sure they will evaluate him and assist with his needs. She verbalized understanding with no further questions.

## 2019-03-01 NOTE — ED Triage Notes (Signed)
Comes by [facility] for continues sob.  Tested for covid few weeks ago negative and is being treated for sleep apnea.  No hypoxia (>96%)when he was walking to truck for ems.  co2 34-35.  Glu 125 and temp 97.2

## 2019-03-01 NOTE — ED Triage Notes (Signed)
Pt comes into the ED via EMS from home with c/o SOB since he was seen here 9/9, states it is worse at night when he lays down, pt is speaking in complete sentences without difficulty.,

## 2019-03-01 NOTE — Telephone Encounter (Signed)
Spoke with patients sister per release form. She states that EMS has already picked him up and been gone for a while now. Inquired what problems he was having and she said that he was having problems with fluid and swelling. Let her know ED will do evaluation and if needed they would reach out to provider covering hospital. She verbalized understanding with no further questions at this time.

## 2019-03-01 NOTE — Progress Notes (Signed)
Anticoagulation monitoring(Lovenox):  63 yo male ordered Lovenox 40 mg Q24h  Filed Weights   03/01/19 0948  Weight: 287 lb 11.2 oz (130.5 kg)   BMI 40.13   Lab Results  Component Value Date   CREATININE 1.52 (H) 03/01/2019   CREATININE 1.58 (H) 02/20/2019   CREATININE 1.63 (H) 12/29/2018   Estimated Creatinine Clearance: 66.7 mL/min (A) (by C-G formula based on SCr of 1.52 mg/dL (H)). Hemoglobin & Hematocrit     Component Value Date/Time   HGB 9.7 (L) 03/01/2019 0956   HGB 11.0 (L) 02/27/2018 0844   HCT 32.3 (L) 03/01/2019 0956   HCT 33.4 (L) 02/27/2018 8921     Per Protocol for Patient with estCrcl > 30 ml/min and BMI > 40, will transition to Lovenox 40 mg Q12h.

## 2019-03-02 ENCOUNTER — Inpatient Hospital Stay (HOSPITAL_COMMUNITY): Payer: Medicare PPO

## 2019-03-02 ENCOUNTER — Encounter: Payer: Self-pay | Admitting: Radiology

## 2019-03-02 DIAGNOSIS — R079 Chest pain, unspecified: Secondary | ICD-10-CM

## 2019-03-02 DIAGNOSIS — I5033 Acute on chronic diastolic (congestive) heart failure: Secondary | ICD-10-CM

## 2019-03-02 DIAGNOSIS — I10 Essential (primary) hypertension: Secondary | ICD-10-CM

## 2019-03-02 DIAGNOSIS — I2510 Atherosclerotic heart disease of native coronary artery without angina pectoris: Secondary | ICD-10-CM

## 2019-03-02 DIAGNOSIS — I248 Other forms of acute ischemic heart disease: Secondary | ICD-10-CM

## 2019-03-02 LAB — NM MYOCAR MULTI W/SPECT W/WALL MOTION / EF
LV dias vol: 173 mL (ref 62–150)
LV sys vol: 94 mL
SDS: 2
SRS: 16
SSS: 6
TID: 1.79

## 2019-03-02 LAB — GLUCOSE, CAPILLARY
Glucose-Capillary: 179 mg/dL — ABNORMAL HIGH (ref 70–99)
Glucose-Capillary: 138 mg/dL — ABNORMAL HIGH (ref 70–99)
Glucose-Capillary: 104 mg/dL — ABNORMAL HIGH (ref 70–99)
Glucose-Capillary: 174 mg/dL — ABNORMAL HIGH (ref 70–99)
Glucose-Capillary: 161 mg/dL — ABNORMAL HIGH (ref 70–99)

## 2019-03-02 LAB — BASIC METABOLIC PANEL
Anion gap: 9 (ref 5–15)
BUN: 27 mg/dL — ABNORMAL HIGH (ref 8–23)
CO2: 26 mmol/L (ref 22–32)
Calcium: 9.3 mg/dL (ref 8.9–10.3)
Chloride: 108 mmol/L (ref 98–111)
Creatinine, Ser: 1.65 mg/dL — ABNORMAL HIGH (ref 0.61–1.24)
GFR calc Af Amer: 50 mL/min — ABNORMAL LOW (ref >60)
GFR calc non Af Amer: 43 mL/min — ABNORMAL LOW (ref >60)
Glucose, Bld: 160 mg/dL — ABNORMAL HIGH (ref 70–99)
Potassium: 3.8 mmol/L (ref 3.5–5.1)
Sodium: 143 mmol/L (ref 135–145)

## 2019-03-02 LAB — LIPID PANEL
Cholesterol: 166 mg/dL (ref 0–200)
HDL: 35 mg/dL — ABNORMAL LOW (ref >40)
LDL Cholesterol: 98 mg/dL (ref 0–99)
Total CHOL/HDL Ratio: 4.7 RATIO
Triglycerides: 166 mg/dL — ABNORMAL HIGH (ref <150)
VLDL: 33 mg/dL (ref 0–40)

## 2019-03-02 MED ORDER — FUROSEMIDE 10 MG/ML IJ SOLN: 40.0000 mg | Freq: Two times a day (BID) | INTRAMUSCULAR | Status: DC

## 2019-03-02 MED ORDER — TECHNETIUM TC 99M TETROFOSMIN IV KIT
10.0000 | PACK | Freq: Once | INTRAVENOUS | Status: AC | PRN
  Administered 2019-03-02: 9.848 via INTRAVENOUS

## 2019-03-02 MED ORDER — REGADENOSON 0.4 MG/5ML IV SOLN
0.4000 mg | Freq: Once | INTRAVENOUS | Status: AC
  Administered 2019-03-02: 0.4 mg via INTRAVENOUS
  Filled 2019-03-02: qty 5

## 2019-03-02 MED ORDER — HEPARIN SODIUM (PORCINE) 5000 UNIT/ML IJ SOLN
5000.0000 [IU] | Freq: Three times a day (TID) | INTRAMUSCULAR | Status: DC
  Administered 2019-03-02 – 2019-03-05 (×8): 5000 [IU] via SUBCUTANEOUS
  Filled 2019-03-02 (×8): qty 1

## 2019-03-02 MED ORDER — ONDANSETRON HCL 4 MG/2ML IJ SOLN
4.0000 mg | Freq: Once | INTRAMUSCULAR | Status: AC
  Administered 2019-03-02: 4 mg via INTRAVENOUS
  Filled 2019-03-02: qty 2

## 2019-03-02 MED ORDER — TECHNETIUM TC 99M TETROFOSMIN IV KIT
30.0000 | PACK | Freq: Once | INTRAVENOUS | Status: AC | PRN
  Administered 2019-03-02: 32.986 via INTRAVENOUS

## 2019-03-02 NOTE — Plan of Care (Signed)
  Problem: Clinical Measurements: Goal: Respiratory complications will improve Outcome: Progressing   

## 2019-03-02 NOTE — Progress Notes (Signed)
[place] at [place] NAME: Larry Callahan    MR#:  [id number]  DATE OF BIRTH:  Jul 31, 1955  SUBJECTIVE:  CHIEF COMPLAINT: Patient was seen after [place].  Denies any chest pain or shortness of breath but does not feel right Unilateral kidney  REVIEW OF SYSTEMS:  CONSTITUTIONAL: No fever, fatigue or weakness.  EYES: No blurred or double vision.  EARS, NOSE, AND THROAT: No tinnitus or ear pain.  RESPIRATORY: No cough, shortness of breath, wheezing or hemoptysis.  CARDIOVASCULAR: No chest pain, orthopnea, edema.  GASTROINTESTINAL: No nausea, vomiting, diarrhea or abdominal pain.  GENITOURINARY: No dysuria, hematuria.  ENDOCRINE: No polyuria, nocturia,  HEMATOLOGY: No anemia, easy bruising or bleeding SKIN: No rash or lesion. MUSCULOSKELETAL: No joint pain or arthritis.   NEUROLOGIC: No tingling, numbness, weakness.  PSYCHIATRY: No anxiety or depression.   DRUG ALLERGIES:   Allergies  Allergen Reactions  . Penicillins     VITALS:  Blood pressure (!) 150/91, pulse 82, temperature 98.7 F (37.1 C), temperature source Oral, resp. rate 12, height 5\' 11"  (1.803 m), weight 123.5 kg, SpO2 95 %.  PHYSICAL EXAMINATION:  GENERAL:  63 y.o.-year-old patient lying in the bed with no acute distress.  EYES: Pupils equal, round, reactive to light and accommodation. No scleral icterus. Extraocular muscles intact.  HEENT: Head atraumatic, normocephalic. Oropharynx and nasopharynx clear.  NECK:  Supple, no jugular venous distention. No thyroid enlargement, no tenderness.  LUNGS: Normal breath sounds bilaterally, no wheezing, rales,rhonchi or crepitation. No use of accessory muscles of respiration.  CARDIOVASCULAR: S1, S2 normal. No murmurs, rubs, or gallops.  ABDOMEN: Soft, nontender, nondistended. Bowel sounds present. No organomegaly or mass.  EXTREMITIES: No pedal edema, cyanosis, or clubbing.  NEUROLOGIC: Cranial nerves II through XII are  intact. Muscle strength 5/5 in all extremities. Sensation intact. Gait not checked.  PSYCHIATRIC: The patient is alert and oriented x 3.  SKIN: No obvious rash, lesion, or ulcer.    LABORATORY PANEL:   CBC Recent Labs  Lab 03/01/19 0956  WBC 8.7  HGB 9.7*  HCT 32.3*  PLT 314   ------------------------------------------------------------------------------------------------------------------  Chemistries  Recent Labs  Lab 03/02/19 0553  NA 143  K 3.8  CL 108  CO2 26  GLUCOSE 160*  BUN 27*  CREATININE 1.65*  CALCIUM 9.3   ------------------------------------------------------------------------------------------------------------------  Cardiac Enzymes No results for input(s): TROPONINI in the last 168 hours. ------------------------------------------------------------------------------------------------------------------  RADIOLOGY:  Dg Chest 2 View  Result Date: 03/01/2019 CLINICAL DATA:  Patient with shortness of breath. EXAM: CHEST - 2 VIEW COMPARISON:  Chest radiograph February 20, 2019 FINDINGS: Stable cardiac and mediastinal contours. Aortic tortuosity. Pulmonary vascular redistribution and mild interstitial opacities. Additionally, there is patchy consolidation within the right lower lung. No pleural effusion or pneumothorax. Thoracic spine degenerative changes. IMPRESSION: Patchy consolidation right mid and lower lung may represent atelectasis or infection. Cardiomegaly with pulmonary vascular redistribution. Electronically Signed   By: Lovey Newcomer M.D.   On: 03/01/2019 10:26   Nm Myocar Multi W/spect W/wall Motion / Ef  Result Date: 03/02/2019  This is a high risk study due to reduced systolic function. There is no ischemia.  There was no ST segment deviation noted during stress.  The left ventricular ejection fraction is severely decreased (<30%). However the veracity of these findings is unclear given poor tracer uptake.  Resting images are poor due to reduced  tracer uptake.  Recommend echo for systolic function assessment.     EKG:  Orders placed or performed during the hospital encounter of 03/01/19  . ED EKG  . ED EKG  . EKG 12-Lead  . EKG 12-Lead    ASSESSMENT AND PLAN:   1.  Acute on chronic diastolic congestive heart failure.  Echocardiogram done a few months back showed a normal ejection fraction.  Will hold diuretics after giving 1 dose in view of worsening of renal function  Patient already on benazepril and Toprol at home we will hold off on the heparin when you have worsening of renal function.  [facility] cardio following .  Appreciate the recommendations   2.  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 labs and avoid nephrotoxins Hold benazepril  3.  Mild COPD exacerbation.  Start nebulizer treatments and try to hold off on systemic steroids.  4. Hypertension-blood pressure is reasonable will titrate medications as needed   5.  Obstructive sleep apnea and morbid obesity.  Weight loss needed for BMI of 40.13.  CPAP at night.   6.  Type 2 diabetes mellitus put on sliding scale.  Continue long-acting insulin at night.  7.  Anemia.  Add on iron studies.  Guaiac stools.       All the records are reviewed and case discussed with Care Management/Social Workerr. Management plans discussed with the patient, family and they are in agreement.  CODE STATUS: fc  TOTAL TIME TAKING CARE OF THIS PATIENT: 36 minutes.   POSSIBLE D/C IN 2  DAYS, DEPENDING ON CLINICAL CONDITION.  Note: This dictation was prepared with Dragon dictation along with smaller phrase technology. Any transcriptional errors that result from this process are unintentional.   Nicholes Mango M.D on 03/02/2019 at 1:56 PM  Between 7am to 6pm - Pager - [phone] After 6pm go to [email or web address] - password [email or web address] [facility]  Office  [phone]  CC: Primary care physician; [name], NP

## 2019-03-02 NOTE — Progress Notes (Addendum)
Progress Note  Patient Name: Larry Callahan Date of Encounter: 03/02/2019  Primary Cardiologist: [name], MD new  Subjective   Feeling well.  Had nausea with Lexiscan Myoview.  No chest pain.  Inpatient Medications    Scheduled Meds: . amLODipine  5 mg Oral Daily   And  . benazepril  20 mg Oral Daily  . aspirin EC  81 mg Oral Daily  . atorvastatin  10 mg Oral q1800  . azithromycin  250 mg Oral Daily  . budesonide (PULMICORT) nebulizer solution  0.5 mg Nebulization BID  . enoxaparin (LOVENOX) injection  40 mg Subcutaneous Q12H  . ferrous gluconate  324 mg Oral Q breakfast  . gemfibrozil  600 mg Oral BID WC  . hydrALAZINE  25 mg Oral TID  . insulin aspart  0-5 Units Subcutaneous QHS  . insulin aspart  0-9 Units Subcutaneous TID WC  . insulin glargine  15 Units Subcutaneous QPM  . ipratropium-albuterol  3 mL Nebulization Q6H  . isosorbide mononitrate  30 mg Oral Daily  . metoprolol succinate  50 mg Oral QHS  . nicotine  21 mg Transdermal Daily  . pantoprazole  40 mg Oral Daily  . potassium chloride  10 mEq Oral BID  . regadenoson  0.4 mg Intravenous Once  . sodium chloride flush  3 mL Intravenous Once  . sodium chloride flush  3 mL Intravenous Q12H   Continuous Infusions: . sodium chloride     PRN Meds: sodium chloride, acetaminophen, clobetasol cream, ondansetron (ZOFRAN) IV, sodium chloride flush, technetium tetrofosmin   Vital Signs    Vitals:   03/01/19 1943 03/01/19 1947 03/02/19 0407 03/02/19 0803  BP: 138/76  (!) 169/93 (!) 173/89  Pulse: 63  75 74  Resp: 18  18 14   Temp: 98.1 F (36.7 C)  97.6 F (36.4 C) 97.9 F (36.6 C)  TempSrc: Oral  Axillary Oral  SpO2: 94% 94% 98% 99%  Weight:   123.5 kg   Height:        Intake/Output Summary (Last 24 hours) at 03/02/2019 1054 Last data filed at 03/02/2019 0205 Gross per 24 hour  Intake 240 ml  Output 675 ml  Net -435 ml   Last 3 Weights 03/02/2019 03/01/2019 02/20/2019  Weight (lbs) 272 lb  3.2 oz 287 lb 11.2 oz 287 lb 11.2 oz  Weight (kg) 123.469 kg 130.5 kg 130.5 kg      Telemetry    Sinus rhythm.  No 3events - Personally Reviewed  ECG    n/a - Personally Reviewed  Physical Exam   VS:  BP (!) 173/89 (BP Location: Left Arm)   Pulse 74   Temp 97.9 F (36.6 C) (Oral)   Resp 14   Ht 5\' 11"  (1.803 m)   Wt 123.5 kg   SpO2 99%   BMI 37.96 kg/m  , BMI Body mass index is 37.96 kg/m. GENERAL:  Well appearing HEENT: Pupils equal round and reactive, fundi not visualized, oral mucosa unremarkable NECK: + jugular venous distention to 2cm above clavicle at 45 degrees, waveform within normal limits, carotid upstroke brisk and symmetric, no bruits LUNGS:  Clear to auscultation bilaterally HEART:  RRR.  PMI not displaced or sustained,S1 and S2 within normal limits, no S3, no S4, no clicks, no rubs, no murmurs ABD:  Flat, positive bowel sounds normal in frequency in pitch, no bruits, no rebound, no guarding, no midline pulsatile mass, no hepatomegaly, no splenomegaly EXT:  2 plus pulses throughout, 1+ LE  edema, no cyanosis no clubbing SKIN:  No rashes no nodules NEURO:  Cranial nerves II through XII grossly intact, motor grossly intact throughout PSYCH:  Cognitively intact, oriented to person place and time   Labs    High Sensitivity Troponin:   Recent Labs  Lab 02/20/19 1654 02/20/19 1847 03/01/19 0956 03/01/19 1712  TROPONINIHS 14 17 20* 21*      Chemistry Recent Labs  Lab 03/01/19 0956 03/02/19 0553  NA 142 143  K 4.0 3.8  CL 112* 108  CO2 21* 26  GLUCOSE 121* 160*  BUN 27* 27*  CREATININE 1.52* 1.65*  CALCIUM 9.4 9.3  GFRNONAA 47* 43*  GFRAA 55* 50*  ANIONGAP 9 9     Hematology Recent Labs  Lab 03/01/19 0956  WBC 8.7  RBC 3.87*  HGB 9.7*  HCT 32.3*  MCV 83.5  MCH 25.1*  MCHC 30.0  RDW 16.2*  PLT 314    BNP Recent Labs  Lab 03/01/19 0956  BNP 322.0*     DDimer No results for input(s): DDIMER in the last 168 hours.   Radiology     Dg Chest 2 View  Result Date: 03/01/2019 CLINICAL DATA:  Patient with shortness of breath. EXAM: CHEST - 2 VIEW COMPARISON:  Chest radiograph February 20, 2019 FINDINGS: Stable cardiac and mediastinal contours. Aortic tortuosity. Pulmonary vascular redistribution and mild interstitial opacities. Additionally, there is patchy consolidation within the right lower lung. No pleural effusion or pneumothorax. Thoracic spine degenerative changes. IMPRESSION: Patchy consolidation right mid and lower lung may represent atelectasis or infection. Cardiomegaly with pulmonary vascular redistribution. Electronically Signed   By: Lovey Newcomer M.D.   On: 03/01/2019 10:26    Cardiac Studies   Echo 12/26/2018: Outside hospital echo.  There is report that left ventricular ejection fraction is within normal range but then it also says mild global hypokinesis.  No percentages recorded.  Mild LVH.  Grade 2 diastolic dysfunction.  Patient Profile     63 y.o. male with morbid obesity, hypertension, diabetes, tobacco use, CKD, single kidney, OSA on CPAP admitted with shortness of breath.  Assessment & Plan    # Shortness of breath: # Acute on chronic diastolic heart failure: BNP elevated to 322 on admission.  He does not weigh himself at home.  He reports some increased edema lately.  He also has some orthopnea but tends to be able to lie flat in bed as long as he uses his CPAP.  He does not always use his diuretic as he does not like it making him go to the bathroom.  I suspect that most of his symptoms are attributable to acute on chronic heart failure.  It seems that his recorded weights are inaccurate.  He went from 130.5kg to 123.5 kg. He is only net -494mL so far and still appears volume overloaded.  Will give one more dose of lasix and watch renal function. Order Ted hose.  Hold home benazepril given worsening renal function.   # Essential hypertension: Blood pressure very poorly-controlled, though he hasn't  received all his home medications yet. Compliance at home is unclear. Will monitor on his home meds for now.  Continue metoprolol, Imdur, and amlodipine.  Hold benazepril as above given worsening renal function and diuresis.   # Non-obstructive CAD:   # Hyperlipidemia:  [name] denies any chest pain.  Troponin is very mildly elevated and flat in a pattern consistent with demand ischemia.  He is currently undergoing Lexiscan Myoview to assess  for ischemia.  Prior left heart catheterization in 2011 revealed nonobstructive disease.  He has left dominant anatomy and known calcification in the LAD.  Continue atorvastatin and gemfibrozil.  Check fasting lipids.  # DM: Consider Jardiance.      For questions or updates, please contact [place] Please consult [email or web address] for contact info under        Signed, Skeet Latch, MD  03/02/2019, 10:54 AM

## 2019-03-02 NOTE — Plan of Care (Signed)
  Problem: Health Behavior/Discharge Planning: Goal: Ability to safely manage health-related needs after discharge will improve Outcome: Progressing   Problem: Education: Goal: Knowledge of General Education information will improve Description: Including pain rating scale, medication(s)/side effects and non-pharmacologic comfort measures Outcome: Progressing   Problem: Activity: Goal: Risk for activity intolerance will decrease Outcome: Progressing

## 2019-03-03 ENCOUNTER — Inpatient Hospital Stay (HOSPITAL_COMMUNITY)
Admit: 2019-03-03 | Discharge: 2019-03-03 | Disposition: A | Discharge status: Home / Self Care | Payer: Medicare PPO | Attending: Cardiovascular Disease | Admitting: Cardiovascular Disease

## 2019-03-03 DIAGNOSIS — R0602 Shortness of breath: Secondary | ICD-10-CM

## 2019-03-03 DIAGNOSIS — N179 Acute kidney failure, unspecified: Secondary | ICD-10-CM

## 2019-03-03 DIAGNOSIS — N182 Chronic kidney disease, stage 2 (mild): Secondary | ICD-10-CM

## 2019-03-03 LAB — ECHOCARDIOGRAM COMPLETE
Height: 71 in
Weight: 4316.8 oz

## 2019-03-03 LAB — GLUCOSE, CAPILLARY
Glucose-Capillary: 126 mg/dL — ABNORMAL HIGH (ref 70–99)
Glucose-Capillary: 209 mg/dL — ABNORMAL HIGH (ref 70–99)
Glucose-Capillary: 167 mg/dL — ABNORMAL HIGH (ref 70–99)
Glucose-Capillary: 185 mg/dL — ABNORMAL HIGH (ref 70–99)

## 2019-03-03 LAB — BASIC METABOLIC PANEL
Anion gap: 8 (ref 5–15)
BUN: 26 mg/dL — ABNORMAL HIGH (ref 8–23)
CO2: 27 mmol/L (ref 22–32)
Calcium: 9 mg/dL (ref 8.9–10.3)
Chloride: 107 mmol/L (ref 98–111)
Creatinine, Ser: 1.8 mg/dL — ABNORMAL HIGH (ref 0.61–1.24)
GFR calc Af Amer: 45 mL/min — ABNORMAL LOW (ref >60)
GFR calc non Af Amer: 39 mL/min — ABNORMAL LOW (ref >60)
Glucose, Bld: 149 mg/dL — ABNORMAL HIGH (ref 70–99)
Potassium: 4.3 mmol/L (ref 3.5–5.1)
Sodium: 142 mmol/L (ref 135–145)

## 2019-03-03 LAB — HIV ANTIBODY (ROUTINE TESTING W REFLEX): HIV Screen 4th Generation wRfx: NONREACTIVE

## 2019-03-03 NOTE — Progress Notes (Signed)
[facility] NAME: Larry Callahan    MR#:  [id number]  DATE OF BIRTH:  [birth year]  SUBJECTIVE:  CHIEF COMPLAINT: Patient denies any chest pain or shortness of breath but does not feel right, leg swelling is better  Unilateral kidney  REVIEW OF SYSTEMS:  CONSTITUTIONAL: No fever, fatigue or weakness.  EYES: No blurred or double vision.  EARS, NOSE, AND THROAT: No tinnitus or ear pain.  RESPIRATORY: No cough, shortness of breath, wheezing or hemoptysis.  CARDIOVASCULAR: No chest pain, orthopnea, edema.  GASTROINTESTINAL: No nausea, vomiting, diarrhea or abdominal pain.  GENITOURINARY: No dysuria, hematuria.  ENDOCRINE: No polyuria, nocturia,  HEMATOLOGY: No anemia, easy bruising or bleeding SKIN: No rash or lesion. MUSCULOSKELETAL: No joint pain or arthritis.   NEUROLOGIC: No tingling, numbness, weakness.  PSYCHIATRY: No anxiety or depression.   DRUG ALLERGIES:   Allergies  Allergen Reactions  . Penicillins     VITALS:  Blood pressure (!) 153/71, pulse 63, temperature 97.7 F (36.5 C), temperature source Oral, resp. rate 18, height 5\' 11"  (1.803 m), weight 122.4 kg, SpO2 95 %.  PHYSICAL EXAMINATION:  GENERAL:  63 y.o.-year-old patient lying in the bed with no acute distress.  EYES: Pupils equal, round, reactive to light and accommodation. No scleral icterus. Extraocular muscles intact.  HEENT: Head atraumatic, normocephalic. Oropharynx and nasopharynx clear.  NECK:  Supple, no jugular venous distention. No thyroid enlargement, no tenderness.  LUNGS: Normal breath sounds bilaterally, no wheezing, rales,rhonchi or crepitation. No use of accessory muscles of respiration.  CARDIOVASCULAR: S1, S2 normal. No murmurs, rubs, or gallops.  ABDOMEN: Soft, nontender, nondistended. Bowel sounds present.  EXTREMITIES:trace  pedal edema, cyanosis, or clubbing.  NEUROLOGIC: Cranial nerves II through XII are intact. Muscle strength  weak  in all extremities. Sensation intact. Gait not checked.  PSYCHIATRIC: The patient is alert and oriented x 3.  SKIN: No obvious rash, lesion, or ulcer.    LABORATORY PANEL:   CBC Recent Labs  Lab 03/01/19 0956  WBC 8.7  HGB 9.7*  HCT 32.3*  PLT 314   ------------------------------------------------------------------------------------------------------------------  Chemistries  Recent Labs  Lab 03/03/19 0532  NA 142  K 4.3  CL 107  CO2 27  GLUCOSE 149*  BUN 26*  CREATININE 1.80*  CALCIUM 9.0   ------------------------------------------------------------------------------------------------------------------  Cardiac Enzymes No results for input(s): TROPONINI in the last 168 hours. ------------------------------------------------------------------------------------------------------------------  RADIOLOGY:  Nm Myocar Multi W/spect W/wall Motion / Ef  Result Date: 03/02/2019  This is a high risk study due to reduced systolic function. There is no ischemia.  There was no ST segment deviation noted during stress.  The left ventricular ejection fraction is severely decreased (<30%). However the veracity of these findings is unclear given poor tracer uptake.  Resting images are poor due to reduced tracer uptake.  Recommend echo for systolic function assessment.     EKG:   Orders placed or performed during the hospital encounter of 03/01/19  . ED EKG  . ED EKG  . EKG 12-Lead  . EKG 12-Lead    ASSESSMENT AND PLAN:   1.  Acute on chronic diastolic congestive heart failure.  Echocardiogram done a few months back showed a normal ejection fraction.  Will hold diuretics in view of worsening of renal function  Patient already on benazepril and Toprol at home we will hold off on the heparin when you have worsening of renal function.  [facility] cardio following .  Appreciate the recommendations  2.  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 labs and avoid nephrotoxins Hold benazepril, lasix Cr 1.2 is baseline , during the hospital course 1.5 --1.65--1.8 Will consider renal US and nephrology consult if no clinical improvement  3.  Mild COPD exacerbation.  Start nebulizer treatments and try to hold off on systemic steroids.  4. Hypertension-blood pressure is reasonable will titrate medications as needed   5.  Obstructive sleep apnea and morbid obesity.  Weight loss needed for BMI of 40.13.  CPAP at night.   6.  Type 2 diabetes mellitus put on sliding scale.  Continue long-acting insulin at night.  7.  Anemia.  Add on iron studies.  Guaiac stools.       All the records are reviewed and case discussed with Care Management/Social Workerr. Management plans discussed with the patient, family and they are in agreement.  CODE STATUS: fc  TOTAL TIME TAKING CARE OF THIS PATIENT: 36 minutes.   POSSIBLE D/C IN 1-2  DAYS, DEPENDING ON CLINICAL CONDITION.  Note: This dictation was prepared with Dragon dictation along with smaller phrase technology. Any transcriptional errors that result from this process are unintentional.   Nicholes Mango M.D on 03/03/2019 at 11:50 AM  Between 7am to 6pm - Pager - [phone] After 6pm go to [email or web address] - password [email or web address] [facility]  Office  [phone]  CC: Primary care physician; [name], NP

## 2019-03-03 NOTE — Plan of Care (Signed)
Nutrition Education Note  RD consulted for nutrition education regarding CHF.  Met with patient at bedside. He reports he has a good appetite and intake at baseline and he is eating well here. He reports he has not previously learned about nutrition for heart failure. He does not cook with or add salt at meals. He does eat other high-sodium foods such as deli meat, fast food, and other processed foods. Discussed that these are higher in sodium and encouraged patient read the labels and make switches for lower-sodium foods. He does not currently have a scale at home. Discussed importance of weighing self daily but patient seemed hesitant to want to purchase digital scale or weigh himself. Patient with limited engagement in education and he does not seem to be ready for behavior change. He may benefit from outpatient nutrition therapy where focus can be more on behavior change.  RD provided "Heart Failure Nutrition Therapy" handout from the Academy of Nutrition and Dietetics. Reviewed patient's dietary recall. Provided examples on ways to decrease sodium intake in diet. Discouraged intake of processed foods and use of salt shaker. Encouraged fresh fruits and vegetables as well as whole grain sources of carbohydrates to maximize fiber intake.   RD discussed why it is important for patient to adhere to diet recommendations, and emphasized the role of fluids, foods to avoid, and importance of weighing self daily. Teach back method used.  Expect poor to fair compliance.  Body mass index is 37.63 kg/m. Pt meets criteria for obesity class II based on current BMI.  Current diet order is heart healthy/carbohydrate modified, patient is consuming approximately 100% of meals at this time. Labs and medications reviewed. No further nutrition interventions warranted at this time. RD contact information provided. If additional nutrition issues arise, please re-consult RD.   Willey Blade, MS, [place], LDN Office:  [phone] Pager: [phone] After Hours/Weekend Pager: [phone]

## 2019-03-03 NOTE — TOC Initial Note (Signed)
Transition of Care [facility]) - Initial/Assessment Note    Patient Details  Name: Larry Callahan MRN: [id number] Date of Birth: [birth year]  [facility]) CM/SW Contact:    Katrina Stack, RN Phone Number: 03/03/2019, 6:06 PM  Clinical Narrative:               Patient says this is the first time he has ever been told he has heart failure.  He does not own a set of scales.  Does not drive. His sisters take him to his appointments. He is agreeable to home health referral. Would benefit from home health nurse to provided assessment and reinforced aspects of heart failure management. No agency preference. Referral for  [facility] RN to [facility].  He would benefit from heart failure clinic but says it is up to his doctor. He has chronic home cpap.  Currently on room air  Expected Discharge Plan: [employer]     Patient Goals and CMS Choice   CMS Medicare.gov Compare Post Acute Care list provided to:: Patient Choice offered to / list presented to : Patient  Expected Discharge Plan and Services Expected Discharge Plan: [place]   Discharge Planning Services: CM Consult Post Acute Care Choice: [place] arrangements for the past 2 months: Mobile Home Expected Discharge Date: 03/03/19                         HH Arranged: RN [place] Agency: [facility] [employer] Date [place]: 03/03/19 Time HH Agency Contacted: (4:53P) Representative spoke with at [place]: Helene Kelp  Prior Living Arrangements/Services Living arrangements for the past 2 months: Mobile Home Lives with:: Self   Do you feel safe going back to the place where you live?: Yes      Need for Family Participation in Patient Care: Yes (Comment) Care giver support system in place?: Yes (comment) Current home services: DME(Home CPAP) Criminal Activity/Legal Involvement Pertinent to Current Situation/Hospitalization: No - Comment as  needed  Activities of Daily Living Home Assistive Devices/Equipment: CPAP ADL Screening (condition at time of admission) Patient's cognitive ability adequate to safely complete daily activities?: Yes Is the patient deaf or have difficulty hearing?: No Does the patient have difficulty seeing, even when wearing glasses/contacts?: No Does the patient have difficulty concentrating, remembering, or making decisions?: No Patient able to express need for assistance with ADLs?: Yes Does the patient have difficulty dressing or bathing?: No Independently performs ADLs?: Yes (appropriate for developmental age) Does the patient have difficulty walking or climbing stairs?: Yes Weakness of Legs: None Weakness of Arms/Hands: None  Permission Sought/Granted                  Emotional Assessment Appearance:: Appears stated age Attitude/Demeanor/Rapport: Engaged Affect (typically observed): Calm Orientation: : Oriented to Self, Oriented to Place, Oriented to  Time, Oriented to Situation Alcohol / Substance Use: Not Applicable Psych Involvement: No (comment)  Admission diagnosis:  SOB (shortness of breath) [J85.63] Acute diastolic congestive heart failure (HCC) [I50.31] Chest pain, unspecified type [R07.9] Patient Active Problem List   Diagnosis Date Noted  . CHF (congestive heart failure) ([place]) 03/01/2019  . Diastolic dysfunction 14/97/0263  . Iron deficiency anemia 12/31/2018  . Atopic dermatitis 11/24/2018  . Screening for colon cancer 11/06/2017  . Uncontrolled type 2 diabetes mellitus with hyperglycemia ([place]) 11/06/2017  . Dermatitis, unspecified 06/09/2017  . Atherosclerotic heart  disease of native coronary artery without angina pectoris 06/09/2017  . 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 Obstructive sleep apnea, adult 06/09/2017  . Morbid (severe) obesity due to excess calories ([place]) 06/09/2017  . Cigarette nicotine dependence with  nicotine-induced disorder 06/09/2017  . Type 2 diabetes mellitus with diabetic polyneuropathy ([place]) 06/09/2017  . Allergic rhinitis due to pollen 06/09/2017  . Mixed hyperlipidemia 06/09/2017  . Chronic obstructive pulmonary disease, unspecified ([place]) 06/09/2017  . Dyspnea on exertion 06/09/2017  . Wheezing 06/09/2017  . Dysuria 06/09/2017  . Snoring 06/09/2017  . Essential hypertension 06/09/2017  . Personal history of other malignant neoplasm of kidney 06/09/2017  . Pain in right hip 06/09/2017  . Impacted cerumen, bilateral 06/09/2017  . Secondary hyperparathyroidism, not elsewhere classified ([place]) 06/09/2017  . Type 2 diabetes mellitus with hyperglycemia ([place]) 06/09/2017  . Tinea corporis 06/09/2017   PCP:  [name], NP Pharmacy:   [facility], [place] [address] Phone: [phone] Fax: [facility] [place], [place] [address] Phone: [phone] Fax: [phone]     Social Determinants of Health (SDOH) Interventions    Readmission Risk Interventions No flowsheet data found.

## 2019-03-03 NOTE — Progress Notes (Signed)
Progress Note  Patient Name: Larry Callahan Date of Encounter: 03/03/2019  Primary Cardiologist: [name], MD new  Subjective   Feeling well.  Still has some shortness of breath but improved.   Inpatient Medications    Scheduled Meds: . amLODipine  5 mg Oral Daily  . aspirin EC  81 mg Oral Daily  . atorvastatin  10 mg Oral q1800  . azithromycin  250 mg Oral Daily  . budesonide (PULMICORT) nebulizer solution  0.5 mg Nebulization BID  . ferrous gluconate  324 mg Oral Q breakfast  . gemfibrozil  600 mg Oral BID WC  . heparin injection (subcutaneous)  5,000 Units Subcutaneous Q8H  . hydrALAZINE  25 mg Oral TID  . insulin aspart  0-5 Units Subcutaneous QHS  . insulin aspart  0-9 Units Subcutaneous TID WC  . insulin glargine  15 Units Subcutaneous QPM  . ipratropium-albuterol  3 mL Nebulization Q6H  . isosorbide mononitrate  30 mg Oral Daily  . metoprolol succinate  50 mg Oral QHS  . nicotine  21 mg Transdermal Daily  . pantoprazole  40 mg Oral Daily  . potassium chloride  10 mEq Oral BID  . sodium chloride flush  3 mL Intravenous Once  . sodium chloride flush  3 mL Intravenous Q12H   Continuous Infusions: . sodium chloride     PRN Meds: sodium chloride, acetaminophen, clobetasol cream, ondansetron (ZOFRAN) IV, sodium chloride flush   Vital Signs    Vitals:   03/02/19 2033 03/03/19 0456 03/03/19 0801 03/03/19 1101  BP:  (!) 153/81 (!) 148/63 (!) 153/71  Pulse:  70 73 63  Resp:  18    Temp:  97.8 F (36.6 C) 97.7 F (36.5 C)   TempSrc:  Oral Oral   SpO2: 95% 96% 95%   Weight:  122.4 kg    Height:        Intake/Output Summary (Last 24 hours) at 03/03/2019 1139 Last data filed at 03/03/2019 0829 Gross per 24 hour  Intake -  Output 850 ml  Net -850 ml   Last 3 Weights 03/03/2019 03/02/2019 03/01/2019  Weight (lbs) 269 lb 12.8 oz 272 lb 3.2 oz 287 lb 11.2 oz  Weight (kg) 122.38 kg 123.469 kg 130.5 kg      Telemetry    Sinus rhythm.  Occasional  PVCs- Personally Reviewed  ECG    n/a - Personally Reviewed  Physical Exam   VS:  BP (!) 153/71   Pulse 63   Temp 97.7 F (36.5 C) (Oral)   Resp 18   Ht 5\' 11"  (1.803 m)   Wt 122.4 kg   SpO2 95%   BMI 37.63 kg/m  , BMI Body mass index is 37.63 kg/m. GENERAL:  Well appearing HEENT: Pupils equal round and reactive, fundi not visualized, oral mucosa unremarkable NECK: + jugular venous distention to 1cm above clavicle at 45 degrees, waveform within normal limits, carotid upstroke brisk and symmetric, no bruits LUNGS:  Clear to auscultation bilaterally HEART:  RRR.  PMI not displaced or sustained,S1 and S2 within normal limits, no S3, no S4, no clicks, no rubs, no murmurs ABD:  Flat, positive bowel sounds normal in frequency in pitch, no bruits, no rebound, no guarding, no midline pulsatile mass, no hepatomegaly, no splenomegaly EXT:  2 plus pulses throughout, trace LE edema, no cyanosis no clubbing SKIN:  No rashes no nodules NEURO:  Cranial nerves II through XII grossly intact, motor grossly intact throughout PSYCH:  Cognitively intact, oriented  to person place and time   Labs    High Sensitivity Troponin:   Recent Labs  Lab 02/20/19 1654 02/20/19 1847 03/01/19 0956 03/01/19 1712  TROPONINIHS 14 17 20* 21*      Chemistry Recent Labs  Lab 03/01/19 0956 03/02/19 0553 03/03/19 0532  NA 142 143 142  K 4.0 3.8 4.3  CL 112* 108 107  CO2 21* 26 27  GLUCOSE 121* 160* 149*  BUN 27* 27* 26*  CREATININE 1.52* 1.65* 1.80*  CALCIUM 9.4 9.3 9.0  GFRNONAA 47* 43* 39*  GFRAA 55* 50* 45*  ANIONGAP 9 9 8      Hematology Recent Labs  Lab 03/01/19 0956  WBC 8.7  RBC 3.87*  HGB 9.7*  HCT 32.3*  MCV 83.5  MCH 25.1*  MCHC 30.0  RDW 16.2*  PLT 314    BNP Recent Labs  Lab 03/01/19 0956  BNP 322.0*     DDimer No results for input(s): DDIMER in the last 168 hours.   Radiology    Nm Myocar Multi W/spect W/wall Motion / Ef  Result Date: 03/02/2019  This is a  high risk study due to reduced systolic function. There is no ischemia.  There was no ST segment deviation noted during stress.  The left ventricular ejection fraction is severely decreased (<30%). However the veracity of these findings is unclear given poor tracer uptake.  Resting images are poor due to reduced tracer uptake.  Recommend echo for systolic function assessment.     Cardiac Studies   Echo 12/26/2018: Outside hospital echo.  There is report that left ventricular ejection fraction is within normal range but then it also says mild global hypokinesis.  No percentages recorded.  Mild LVH.  Grade 2 diastolic dysfunction.  Lexiscan Myoview 03/02/19:  This is a high risk study due to reduced systolic function. There is no ischemia.  There was no ST segment deviation noted during stress.  The left ventricular ejection fraction is severely decreased (<30%). However the veracity of these findings is unclear given poor tracer uptake.  Resting images are poor due to reduced tracer uptake.  Recommend echo for systolic function assessment.  Patient Profile     63 y.o. male with morbid obesity, hypertension, diabetes, tobacco use, CKD, single kidney, OSA on CPAP admitted with shortness of breath.  Assessment & Plan    # Shortness of breath: # Acute on chronic diastolic heart failure: BNP elevated to 322 on admission.  He does not weigh himself at home.  He reports some increased edema lately.  He also has some orthopnea but tends to be able to lie flat in bed as long as he uses his CPAP.  He does not always use his diuretic as he does not like it making him go to the bathroom.  I suspect that most of his symptoms are attributable to acute on chronic heart failure.  However renal function worsened with diuresis.  Lexiscan Myoview was negative for ischemia.  Tracer uptake at rest was very poor. Calculated EF was <30%.  However findings are not reliable due to poor quality study.  We will  get an echo today to better assess.  Continue to hold diuretics.   # Acute on chronic renal failure:  Creatinine up to 1.8 today from his baseline of 1.2.  Holding diuretics and home benazepril.  He is still mildly volume overloaded.  Will get echo as above.   # Essential hypertension: Blood pressure very poorly-controlled, though he hasn't received  all his home medications yet. Compliance at home is unclear. Will monitor on his home meds for now.  Continue metoprolol, Imdur, and amlodipine.  Hold benazepril as above given worsening renal function and diuresis.   # Non-obstructive CAD:   # Hyperlipidemia:  [name] denies any chest pain.  Troponin is very mildly elevated and flat in a pattern consistent with demand ischemia.  Lexiscan Myoview was negative for ischemia.  Prior left heart catheterization in 2011 revealed nonobstructive disease.  He has left dominant anatomy and known calcification in the LAD.  Continue atorvastatin and gemfibrozil. LDL 98 on this admission.  # DM: Consider Jardiance.      For questions or updates, please contact [place] Please consult [email or web address] for contact info under        Signed, Skeet Latch, MD  03/03/2019, 11:39 AM

## 2019-03-04 DIAGNOSIS — I5033 Acute on chronic diastolic (congestive) heart failure: Secondary | ICD-10-CM

## 2019-03-04 LAB — GLUCOSE, CAPILLARY
Glucose-Capillary: 154 mg/dL — ABNORMAL HIGH (ref 70–99)
Glucose-Capillary: 280 mg/dL — ABNORMAL HIGH (ref 70–99)
Glucose-Capillary: 290 mg/dL — ABNORMAL HIGH (ref 70–99)
Glucose-Capillary: 139 mg/dL — ABNORMAL HIGH (ref 70–99)
Glucose-Capillary: 224 mg/dL — ABNORMAL HIGH (ref 70–99)

## 2019-03-04 LAB — BASIC METABOLIC PANEL
Anion gap: 8 (ref 5–15)
BUN: 28 mg/dL — ABNORMAL HIGH (ref 8–23)
CO2: 25 mmol/L (ref 22–32)
Calcium: 8.9 mg/dL (ref 8.9–10.3)
Chloride: 107 mmol/L (ref 98–111)
Creatinine, Ser: 1.66 mg/dL — ABNORMAL HIGH (ref 0.61–1.24)
GFR calc Af Amer: 49 mL/min — ABNORMAL LOW (ref >60)
GFR calc non Af Amer: 43 mL/min — ABNORMAL LOW (ref >60)
Glucose, Bld: 172 mg/dL — ABNORMAL HIGH (ref 70–99)
Potassium: 4 mmol/L (ref 3.5–5.1)
Sodium: 140 mmol/L (ref 135–145)

## 2019-03-04 MED ORDER — IPRATROPIUM-ALBUTEROL 0.5-2.5 (3) MG/3ML IN SOLN
3.0000 mL | Freq: Three times a day (TID) | RESPIRATORY_TRACT | Status: DC
  Administered 2019-03-04 – 2019-03-05 (×3): 3 mL via RESPIRATORY_TRACT
  Filled 2019-03-04 (×3): qty 3

## 2019-03-04 NOTE — Care Management Important Message (Signed)
Important Message  Patient Details  Name: Larry Callahan MRN: [id number] Date of Birth: 1955/10/08   Medicare Important Message Given:  Yes     Dannette Barbara 03/04/2019, 11:32 AM

## 2019-03-04 NOTE — Evaluation (Signed)
Physical Therapy Evaluation Patient Details Name: Larry Callahan MRN: [id number] DOB: 02/07/1956 Today's Date: 03/04/2019   History of Present Illness  Per MD: Pt is a 63 y.o. male with a known history of diabetes and hypertension presents with shortness of breath.  He states he cannot breathe well.  He has come to the ER a few times.  He states he cannot sleep.  He has sleep apnea has been wearing a CPAP.  He does have cough.  His feet are swollen.  Patient states that he was given some pills that make him urinate a lot and he does not like to take them.  Hospitalist services contacted for further evaluation. PMH includes: acute-on-chronic CHF, AKI and CKD, Mild COPD, HTN, sleep apnea, morbid obesity, DM, anemia, dizziness.  Clinical Impression  Pt presented with no deficits in strength, transfers, mobility, gait, balance, and activity tolerance. Pt was on 2L of wall oxygen at beginning of session with SpO2 of 96%, and with nurse approval pt was weaned onto room oxygen with no drop on SpO2. Pt's completed seated exercises and walk with RW in his room without any negative symptoms, and vital were monitored throughout and stayed within 93-96% SpO2, and HR of 63-101. Pt completed dynamic gait to and from the nurses station with starts & stops, and sharp turns, and finally completed a walk to and from the stairwell and 10 stairs (5x2) and only used the rail for the ascent. Pt reports having returned to near his PLOF and at this time PT service are not being recommended. Will complete PT orders at this time but will reassess pt pending a change in status upon receipt of new PT orders.     Follow Up Recommendations No PT follow up    Equipment Recommendations  None recommended by PT    Recommendations for Other Services       Precautions / Restrictions Precautions Precautions: Fall      Mobility  Bed Mobility Overal bed mobility: Independent                Transfers Overall  transfer level: Modified independent Equipment used: None             General transfer comment: Pt was seated in chair throughout and had no difficulty, maybe just needed to rock/build momentum to get up.  Ambulation/Gait Ambulation/Gait assistance: Independent Gait Distance (Feet): 200 Feet Assistive device: None Gait Pattern/deviations: Decreased step length - right;Decreased step length - left;Wide base of support Gait velocity: Near normal   General Gait Details: Pt excessively weight shifts in an alternating almost-Trendelenburg pattern  Stairs Stairs: Yes Stairs assistance: Independent Stair Management: No rails;One rail Left Number of Stairs: 10 General stair comments: Pt completed stairs with little-to-no cuing and did not fatigue  Wheelchair Mobility    Modified Rankin (Stroke Patients Only)       Balance Overall balance assessment: Mild deficits observed, not formally tested                                           Pertinent Vitals/Pain Pain Assessment: No/denies pain    Home Living Family/patient expects to be discharged to:: Private residence Living Arrangements: Alone Available Help at Discharge: Friend(s);Family;Available PRN/intermittently Type of Home: Mobile home Home Access: Stairs to enter Entrance Stairs-Rails: Left Entrance Stairs-Number of Steps: 5 Home Layout: One level Home Equipment: None Additional  Comments: Has a CPAP machine with an O2 tank, but hasn't been using it because his power has been off    Prior Function Level of Independence: Independent               Hand Dominance        Extremity/Trunk Assessment   Upper Extremity Assessment Upper Extremity Assessment: Overall WFL for tasks assessed    Lower Extremity Assessment Lower Extremity Assessment: Generalized weakness    Cervical / Trunk Assessment Cervical / Trunk Assessment: Normal  Communication   Communication: No difficulties   Cognition Arousal/Alertness: Awake/alert Behavior During Therapy: WFL for tasks assessed/performed Overall Cognitive Status: No family/caregiver present to determine baseline cognitive functioning                                 General Comments: Pt hard a hard time focusing on the topic at hand/answering a direct question, but PT is not certain if this is cognitive baseline or not.      General Comments      Exercises Total Joint Exercises Quad Sets: AROM;Strengthening;Both;10 reps Towel Squeeze: Strengthening;Both;10 reps Short Arc Quad: Strengthening;Both;10 reps Knee Flexion: Strengthening;Both;10 reps   Assessment/Plan    PT Assessment Patent does not need any further PT services  PT Problem List         PT Treatment Interventions      PT Goals (Current goals can be found in the Care Plan section)  Acute Rehab PT Goals PT Goal Formulation: All assessment and education complete, DC therapy    Frequency     Barriers to discharge        Co-evaluation               AM-PAC PT "6 Clicks" Mobility  Outcome Measure Help needed turning from your back to your side while in a flat bed without using bedrails?: None Help needed moving from lying on your back to sitting on the side of a flat bed without using bedrails?: None Help needed moving to and from a bed to a chair (including a wheelchair)?: None Help needed standing up from a chair using your arms (e.g., wheelchair or bedside chair)?: None Help needed to walk in hospital room?: None Help needed climbing 3-5 steps with a railing? : None 6 Click Score: 24    End of Session Equipment Utilized During Treatment: Gait belt Activity Tolerance: Patient tolerated treatment well Patient left: in chair;with call bell/phone within reach;with chair alarm set Nurse Communication: Mobility status PT Visit Diagnosis: Muscle weakness (generalized) (M62.81)    Time: 2841-3244 PT Time Calculation (min)  (ACUTE ONLY): 37 min   Charges:             Juanda Crumble "Gus" Lei Dower, SPT  03/04/19, 5:18 PM

## 2019-03-04 NOTE — Progress Notes (Signed)
Progress Note  Patient Name: Larry Callahan Date of Encounter: 03/04/2019  Primary Cardiologist: [name], MD   Subjective   Less shortness of breath but he complains of dizziness and headache.  He does not feel ready to go home.  Inpatient Medications    Scheduled Meds: . amLODipine  5 mg Oral Daily  . aspirin EC  81 mg Oral Daily  . atorvastatin  10 mg Oral q1800  . azithromycin  250 mg Oral Daily  . budesonide (PULMICORT) nebulizer solution  0.5 mg Nebulization BID  . ferrous gluconate  324 mg Oral Q breakfast  . gemfibrozil  600 mg Oral BID WC  . heparin injection (subcutaneous)  5,000 Units Subcutaneous Q8H  . hydrALAZINE  25 mg Oral TID  . insulin aspart  0-5 Units Subcutaneous QHS  . insulin aspart  0-9 Units Subcutaneous TID WC  . insulin glargine  15 Units Subcutaneous QPM  . ipratropium-albuterol  3 mL Nebulization Q6H  . isosorbide mononitrate  30 mg Oral Daily  . metoprolol succinate  50 mg Oral QHS  . nicotine  21 mg Transdermal Daily  . pantoprazole  40 mg Oral Daily  . potassium chloride  10 mEq Oral BID  . sodium chloride flush  3 mL Intravenous Once  . sodium chloride flush  3 mL Intravenous Q12H   Continuous Infusions: . sodium chloride     PRN Meds: sodium chloride, acetaminophen, clobetasol cream, ondansetron (ZOFRAN) IV, sodium chloride flush   Vital Signs    Vitals:   03/04/19 0435 03/04/19 0500 03/04/19 0636 03/04/19 0804  BP: 128/76   (!) 154/78  Pulse: 67   (!) 59  Resp: 18   19  Temp: 97.9 F (36.6 C)   97.9 F (36.6 C)  TempSrc:    Oral  SpO2: 99%   97%  Weight:  124.8 kg 123 kg   Height:        Intake/Output Summary (Last 24 hours) at 03/04/2019 1036 Last data filed at 03/04/2019 0569 Gross per 24 hour  Intake 480 ml  Output 2125 ml  Net -1645 ml   Last 3 Weights 03/04/2019 03/04/2019 03/03/2019  Weight (lbs) 271 lb 1.6 oz 275 lb 3.2 oz 269 lb 12.8 oz  Weight (kg) 122.97 kg 124.83 kg 122.38 kg      Telemetry     Sinus rhythm with occasional PVCs- Personally Reviewed  ECG    Not done today- Personally Reviewed  Physical Exam   GEN: No acute distress.   Neck: No JVD Cardiac: RRR, no murmurs, rubs, or gallops.  Respiratory: Clear to auscultation bilaterally. GI: Soft, nontender, non-distended  MS: Trace bilateral leg edema; No deformity. Neuro:  Nonfocal  Psych: Normal affect   Labs    High Sensitivity Troponin:   Recent Labs  Lab 02/20/19 1654 02/20/19 1847 03/01/19 0956 03/01/19 1712  TROPONINIHS 14 17 20* 21*      Chemistry Recent Labs  Lab 03/02/19 0553 03/03/19 0532 03/04/19 0505  NA 143 142 140  K 3.8 4.3 4.0  CL 108 107 107  CO2 26 27 25   GLUCOSE 160* 149* 172*  BUN 27* 26* 28*  CREATININE 1.65* 1.80* 1.66*  CALCIUM 9.3 9.0 8.9  GFRNONAA 43* 39* 43*  GFRAA 50* 45* 49*  ANIONGAP 9 8 8      Hematology Recent Labs  Lab 03/01/19 0956  WBC 8.7  RBC 3.87*  HGB 9.7*  HCT 32.3*  MCV 83.5  MCH 25.1*  MCHC  30.0  RDW 16.2*  PLT 314    BNP Recent Labs  Lab 03/01/19 0956  BNP 322.0*     DDimer No results for input(s): DDIMER in the last 168 hours.   Radiology    Nm Myocar Multi W/spect W/wall Motion / Ef  Result Date: 03/02/2019  This is a high risk study due to reduced systolic function. There is no ischemia.  There was no ST segment deviation noted during stress.  The left ventricular ejection fraction is severely decreased (<30%). However the veracity of these findings is unclear given poor tracer uptake.  Resting images are poor due to reduced tracer uptake.  Recommend echo for systolic function assessment.     Cardiac Studies   Echocardiogram done yesterday:    1. Left ventricular ejection fraction, by visual estimation, is 55 to 60%. The left ventricle has normal function. Normal left ventricular size. Left ventricular septal wall thickness was mildly increased. Mildly increased left ventricular posterior  wall thickness. There is  mildly increased left ventricular hypertrophy.  2. Elevated left ventricular end-diastolic pressure.  3. Left ventricular diastolic Doppler parameters are consistent with pseudonormalization pattern of LV diastolic filling.  4. Global right ventricle has normal systolic function.The right ventricular size is normal. No increase in right ventricular wall thickness.  5. Left atrial size was severely dilated.  6. Right atrial size was normal.  7. The mitral valve is normal in structure. Trace mitral valve regurgitation. No evidence of mitral stenosis.  8. The tricuspid valve is normal in structure. Tricuspid valve regurgitation is trivial.  9. The aortic valve is normal in structure. Aortic valve regurgitation was not visualized by color flow Doppler. Mild aortic valve sclerosis without stenosis. 10. The pulmonic valve was normal in structure. Pulmonic valve regurgitation is trivial by color flow Doppler. 11. Moderately elevated pulmonary artery systolic pressure. 12. The inferior vena cava is dilated in size with >50% respiratory variability, suggesting right atrial pressure of 8 mmHg.  Patient Profile     63 y.o. male  with morbid obesity, hypertension, diabetes, tobacco use, CKD, single kidney, OSA on CPAP admitted with shortness of breath.  Assessment & Plan    Acute on chronic diastolic heart failure: Stress test showed no evidence of ischemia.  Echocardiogram yesterday confirmed grade 2 diastolic dysfunction with moderate pulmonary hypertension.  I suspect that uncontrolled hypertension and sleep apnea in addition to morbid obesity is significantly contributing to his diastolic heart failure.  Recommend a maintenance dose of oral furosemide 40 mg once daily.  The patient was not taking a diuretic in a regular basis as an outpatient due to worsening of renal function.  Acute on chronic kidney disease: I agree with holding his ACE inhibitor for now.  Likely start furosemide 40 mg by mouth once  daily tomorrow.  Essential hypertension: Blood pressure is usually difficult to control but seems to be better while hospitalized.  I made no changes in his medications.  Nonobstructive coronary artery disease: He had previous cardiac catheterization in 2011.  Lexiscan Myoview during this admission showed no evidence of ischemia.      For questions or updates, please contact [place] Please consult [email or web address] for contact info under        Signed, Kathlyn [place], MD  03/04/2019, 10:36 AM

## 2019-03-04 NOTE — Plan of Care (Signed)
Patient OOB to recliner today, weaned to room air. Salt and fluid intake video shown to patient. Verbalized understanding but was reluctant to watch. Educated on importance of medications and heart failure regimen, verbalized understanding however needs reinforcement.   Problem: Activity: Goal: Ability to tolerate increased activity will improve Outcome: Progressing   Problem: Health Behavior/Discharge Planning: Goal: Ability to manage health-related needs will improve Outcome: Not Progressing   Problem: Nutrition: Goal: Adequate nutrition will be maintained Outcome: Progressing   Problem: Education: Goal: Ability to demonstrate management of disease process will improve Outcome: Not Progressing Goal: Individualized Educational Video(s) Outcome: Progressing

## 2019-03-04 NOTE — Progress Notes (Signed)
[facility] [place] NAME: Larry Callahan    MR#:  [id number]  DATE OF BIRTH:  11-27-1955  SUBJECTIVE:  CHIEF COMPLAINT: Patient is reporting dizziness, leg swelling is better  Unilateral kidney  REVIEW OF SYSTEMS:  CONSTITUTIONAL: No fever, fatigue or weakness.  EYES: No blurred or double vision.  EARS, NOSE, AND THROAT: No tinnitus or ear pain.  RESPIRATORY: No cough, shortness of breath, wheezing or hemoptysis.  CARDIOVASCULAR: No chest pain, orthopnea, edema.  GASTROINTESTINAL: No nausea, vomiting, diarrhea or abdominal pain.  GENITOURINARY: No dysuria, hematuria.  ENDOCRINE: No polyuria, nocturia,  HEMATOLOGY: No anemia, easy bruising or bleeding SKIN: No rash or lesion. MUSCULOSKELETAL: No joint pain or arthritis.   NEUROLOGIC: No tingling, numbness, weakness.  PSYCHIATRY: No anxiety or depression.   DRUG ALLERGIES:   Allergies  Allergen Reactions  . Penicillins     VITALS:  Blood pressure (!) 154/78, pulse (!) 59, temperature 97.9 F (36.6 C), temperature source Oral, resp. rate 19, height 5\' 11"  (1.803 m), weight 123 kg, SpO2 97 %.  PHYSICAL EXAMINATION:  GENERAL:  63 y.o.-year-old patient lying in the bed with no acute distress.  EYES: Pupils equal, round, reactive to light and accommodation. No scleral icterus. Extraocular muscles intact.  HEENT: Head atraumatic, normocephalic. Oropharynx and nasopharynx clear.  NECK:  Supple, no jugular venous distention. No thyroid enlargement, no tenderness.  LUNGS: Normal breath sounds bilaterally, no wheezing, rales,rhonchi or crepitation. No use of accessory muscles of respiration.  CARDIOVASCULAR: S1, S2 normal. No murmurs, rubs, or gallops.  ABDOMEN: Soft, nontender, nondistended. Bowel sounds present.  EXTREMITIES:trace  pedal edema, cyanosis, or clubbing.  NEUROLOGIC: Cranial nerves II through XII are intact. Muscle strength weak  in all extremities. Sensation intact.  Gait not checked.  PSYCHIATRIC: The patient is alert and oriented x 3.  SKIN: No obvious rash, lesion, or ulcer.    LABORATORY PANEL:   CBC Recent Labs  Lab 03/01/19 0956  WBC 8.7  HGB 9.7*  HCT 32.3*  PLT 314   ------------------------------------------------------------------------------------------------------------------  Chemistries  Recent Labs  Lab 03/04/19 0505  NA 140  K 4.0  CL 107  CO2 25  GLUCOSE 172*  BUN 28*  CREATININE 1.66*  CALCIUM 8.9   ------------------------------------------------------------------------------------------------------------------  Cardiac Enzymes No results for input(s): TROPONINI in the last 168 hours. ------------------------------------------------------------------------------------------------------------------  RADIOLOGY:  No results found.  EKG:   Orders placed or performed during the hospital encounter of 03/01/19  . ED EKG  . ED EKG  . EKG 12-Lead  . EKG 12-Lead    ASSESSMENT AND PLAN:   1.  Acute on chronic diastolic congestive heart failure.  Echocardiogram done a few months back showed a normal ejection fraction.  Will hold diuretics in view of worsening of renal function  Patient on Toprol at home holding ACE inhibitor in view of renal function worsening [facility] cardio following .  Appreciate the recommendations  If creatinine continues to improve resume Lasix 40 mg once daily from tomorrow Repeat echocardiogram on 03/03/2019 with ejection fraction 55 -60 percent  2.  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 labs and avoid nephrotoxins Hold benazepril, lasix, will consider to resume Lasix from tomorrow if renal function is improving Cr 1.2 is baseline , during the hospital course 1.5 --1.65--1.8 -1.6 Will consider renal US and nephrology consult if no clinical improvement  3.   Mild COPD exacerbation.  nebulizer treatments and try to hold off on systemic steroids.  4. Hypertension-blood pressure is reasonable will titrate medications as needed   5.  Obstructive sleep apnea and morbid obesity.  Weight loss needed for BMI of 40.13.  CPAP at night.   6.  Type 2 diabetes mellitus put on sliding scale.  Continue long-acting insulin at night.  7.  Anemia.  Add on iron studies.  Guaiac stools.  .8  Dizziness check orthostatics.  PT evaluation is pending       All the records are reviewed and case discussed with Care Management/Social Workerr. Management plans discussed with the patient, family and they are in agreement.  CODE STATUS: fc  TOTAL TIME TAKING CARE OF THIS PATIENT: 36 minutes.   POSSIBLE D/C IN 1-2  DAYS, DEPENDING ON CLINICAL CONDITION.  Note: This dictation was prepared with Dragon dictation along with smaller phrase technology. Any transcriptional errors that result from this process are unintentional.   Nicholes Mango M.D on 03/04/2019 at 1:00 PM  Between 7am to 6pm - Pager - [phone] After 6pm go to [email or web address] - password [email or web address] [facility]  Office  [phone]  CC: Primary care physician; [name], NP

## 2019-03-04 NOTE — Progress Notes (Signed)
Nutrition Brief Note   RD received consult for CHF diet education. Pt previously educated this admit (see note from 9/20 ). If pt needs further diet education, consider referral to Nutrition and Diabetes Education Services at [facility]. Phone # [phone]  Koleen Distance MS, RD, [place] Pager #- [phone] Office#- [phone] After Hours Pager: [phone]

## 2019-03-05 DIAGNOSIS — N189 Chronic kidney disease, unspecified: Secondary | ICD-10-CM

## 2019-03-05 DIAGNOSIS — N1832 Chronic kidney disease, stage 3b: Secondary | ICD-10-CM

## 2019-03-05 DIAGNOSIS — N179 Acute kidney failure, unspecified: Secondary | ICD-10-CM

## 2019-03-05 DIAGNOSIS — I5033 Acute on chronic diastolic (congestive) heart failure: Secondary | ICD-10-CM

## 2019-03-05 LAB — BASIC METABOLIC PANEL
Anion gap: 6 (ref 5–15)
BUN: 30 mg/dL — ABNORMAL HIGH (ref 8–23)
CO2: 24 mmol/L (ref 22–32)
Calcium: 8.8 mg/dL — ABNORMAL LOW (ref 8.9–10.3)
Chloride: 112 mmol/L — ABNORMAL HIGH (ref 98–111)
Creatinine, Ser: 1.57 mg/dL — ABNORMAL HIGH (ref 0.61–1.24)
GFR calc Af Amer: 53 mL/min — ABNORMAL LOW (ref >60)
GFR calc non Af Amer: 46 mL/min — ABNORMAL LOW (ref >60)
Glucose, Bld: 159 mg/dL — ABNORMAL HIGH (ref 70–99)
Potassium: 4.3 mmol/L (ref 3.5–5.1)
Sodium: 142 mmol/L (ref 135–145)

## 2019-03-05 LAB — CBC
HCT: 31.8 % — ABNORMAL LOW (ref 39.0–52.0)
Hemoglobin: 9.3 g/dL — ABNORMAL LOW (ref 13.0–17.0)
MCH: 24.7 pg — ABNORMAL LOW (ref 26.0–34.0)
MCHC: 29.2 g/dL — ABNORMAL LOW (ref 30.0–36.0)
MCV: 84.4 fL (ref 80.0–100.0)
Platelets: 272 10*3/uL (ref 150–400)
RBC: 3.77 MIL/uL — ABNORMAL LOW (ref 4.22–5.81)
RDW: 15.7 % — ABNORMAL HIGH (ref 11.5–15.5)
WBC: 6.4 10*3/uL (ref 4.0–10.5)
nRBC: 0 % (ref 0.0–0.2)

## 2019-03-05 LAB — GLUCOSE, CAPILLARY
Glucose-Capillary: 148 mg/dL — ABNORMAL HIGH (ref 70–99)
Glucose-Capillary: 299 mg/dL — ABNORMAL HIGH (ref 70–99)

## 2019-03-05 MED ORDER — TOUJEO SOLOSTAR 300 UNIT/ML ~~LOC~~ SOPN: 15.0000 [IU] | PEN_INJECTOR | Freq: Every evening | SUBCUTANEOUS | 5 refills | Status: DC

## 2019-03-05 MED ORDER — HYDRALAZINE HCL 50 MG PO TABS: 50.0000 mg | ORAL_TABLET | Freq: Three times a day (TID) | ORAL | 0 refills | Status: DC

## 2019-03-05 MED ORDER — AMLODIPINE BESYLATE 10 MG PO TABS: 10.0000 mg | ORAL_TABLET | Freq: Every day | ORAL | 0 refills | Status: DC

## 2019-03-05 MED ORDER — ALBUTEROL SULFATE HFA 108 (90 BASE) MCG/ACT IN AERS: 2.0000 | INHALATION_SPRAY | Freq: Four times a day (QID) | RESPIRATORY_TRACT | 0 refills | Status: DC | PRN

## 2019-03-05 MED ORDER — FUROSEMIDE 20 MG PO TABS: 40.0000 mg | ORAL_TABLET | Freq: Every day | ORAL | 0 refills | Status: DC

## 2019-03-05 MED ORDER — ISOSORBIDE MONONITRATE ER 30 MG PO TB24: 30.0000 mg | ORAL_TABLET | Freq: Every day | ORAL | 0 refills | Status: DC

## 2019-03-05 MED ORDER — FLUTICASONE-SALMETEROL 250-50 MCG/DOSE IN AEPB: 1.0000 | INHALATION_SPRAY | Freq: Two times a day (BID) | RESPIRATORY_TRACT | 0 refills | Status: DC

## 2019-03-05 MED ORDER — ACETAMINOPHEN 325 MG PO TABS: 650.0000 mg | ORAL_TABLET | ORAL | Status: DC | PRN

## 2019-03-05 NOTE — Discharge Summary (Signed)
[place] at [place] NAME: Larry Callahan    MR#:  [id number]  DATE OF BIRTH:  June 26, 1955  DATE OF ADMISSION:  03/01/2019 ADMITTING PHYSICIAN: Loletha Grayer, MD  DATE OF DISCHARGE:  03/05/19   PRIMARY CARE PHYSICIAN: [name], NP    ADMISSION DIAGNOSIS:  SOB (shortness of breath) [D42.87] Acute diastolic congestive heart failure (HCC) [I50.31] Chest pain, unspecified type [R07.9]  DISCHARGE DIAGNOSIS:  Active Problems:   CHF (congestive heart failure) ([place]) AKI on ckd 3  AECOPD SECONDARY DIAGNOSIS:   Past Medical History:  Diagnosis Date  . Allergy   . Coronary artery disease   . Diabetes mellitus without complication ([place])   . GERD (gastroesophageal reflux disease)   . Hyperlipidemia   . Hypertension   . Sleep apnea     HOSPITAL COURSE:  HPI  Larry Callahan  is a 63 y.o. male with a known history of diabetes and hypertension presents with shortness of breath.  He states he cannot breathe well.  He has come to the ER a few times.  He states he cannot sleep.  He has sleep apnea has been wearing a CPAP.  He does have cough.  His feet are swollen.  Patient states that he was given some pills that make him urinate a lot and he does not like to take them.  Hospitalist services contacted for further evaluation.   1. Acute on chronic diastolic congestive heart failure. Echocardiogram done a few months back showed a normal ejection fraction. Will hold diuretics in view of worsening of renal function Patient on Toprol at home holding ACE inhibitor in view of renal function worsening [facility] cardio following .  Appreciate the recommendations If creatinine continues to improve resume Lasix 40 mg once daily from tomorrow 03/06/2019 with potassium supplements Holding ACE inhibitor in view of acute renal insufficiency and patient being having unilateral kidney Repeat echocardiogram on 03/03/2019 with ejection fraction 55 -60  percent  2.  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 labs and avoid nephrotoxins Hold benazepril, lasix, will consider to resume Lasix from tomorrow if renal function is improving Cr 1.2 is baseline , during the hospital course 1.5 --1.65--1.8 -1.6 Will consider renal US and nephrology consult if no clinical improvement  3. Mild COPD exacerbation. nebulizer treatments given during the hospital course did not give steroids patient clinically improved  4. Hypertension-blood pressure is reasonable will titrate medications as needed  5. Obstructive sleep apnea and morbid obesity. Weight loss needed for BMI of 40.13. CPAP at night.   6. Type 2 diabetes mellitus put on sliding scale. Continue long-acting insulin at night.  Holding metformin  7. Anemia. Iron tabs , op f/u with pcp  .8  Dizziness pt is not orthostatics.  PT evaluation- no PT needs   DISCHARGE CONDITIONS:     CONSULTS OBTAINED:  Treatment Team:  [name], MD   PROCEDURES  None   DRUG ALLERGIES:   Allergies  Allergen Reactions  . Penicillins     DISCHARGE MEDICATIONS:   Allergies as of 03/05/2019      Reactions   Penicillins       Medication List    STOP taking these medications   amLODipine-benazepril 5-20 MG capsule Commonly known as: LOTREL   glyBURIDE-metformin 5-500 MG tablet Commonly known as: GLUCOVANCE   lisinopril 5 MG  tablet Commonly known as: ZESTRIL   meloxicam 7.5 MG tablet Commonly known as: MOBIC     TAKE these medications   acetaminophen 325 MG tablet Commonly known as: TYLENOL Take 2 tablets (650 mg total) by mouth every 4 (four) hours as needed for headache or mild pain.   albuterol 108 (90 Base) MCG/ACT inhaler Commonly known as: VENTOLIN HFA Inhale 2 puffs into the lungs every 6 (six) hours as needed for wheezing or  shortness of breath.   Alcohol Swabs Pads 1 each. What changed: Another medication with the same name was removed. Continue taking this medication, and follow the directions you see here.   amLODipine 10 MG tablet Commonly known as: NORVASC Take 1 tablet (10 mg total) by mouth daily. Start taking on: March 06, 2019   aspirin EC 81 MG tablet Take 81 mg by mouth daily.   atorvastatin 10 MG tablet Commonly known as: LIPITOR Take 1 tablet (10 mg total) by mouth daily.   clobetasol cream 0.05 % Commonly known as: TEMOVATE Apply 1 application topically 2 (two) times daily.   ferrous gluconate 324 MG tablet Commonly known as: FERGON Take 1 tablet (324 mg total) by mouth daily with breakfast.   Fluticasone-Salmeterol 250-50 MCG/DOSE Aepb Commonly known as: Advair Diskus Inhale 1 puff into the lungs 2 (two) times daily.   furosemide 20 MG tablet Commonly known as: Lasix Take 2 tablets (40 mg total) by mouth daily with breakfast. What changed: how much to take   gemfibrozil 600 MG tablet Commonly known as: LOPID Take 1 tablet (600 mg total) by mouth 2 (two) times daily with a meal.   glucose blood test strip Commonly known as: True Metrix Blood Glucose Test Use as instructed twice daily diag E11.65   hydrALAZINE 50 MG tablet Commonly known as: APRESOLINE Take 1 tablet (50 mg total) by mouth 3 (three) times daily. What changed:   medication strength  how much to take   isosorbide mononitrate 30 MG 24 hr tablet Commonly known as: IMDUR Take 1 tablet (30 mg total) by mouth daily. Start taking on: March 06, 2019   metoprolol succinate 50 MG 24 hr tablet Commonly known as: TOPROL-XL Take 1 tablet (50 mg total) by mouth at bedtime. Take with or immediately following a meal.   nicotine 21 mg/24hr patch Commonly known as: NICODERM CQ - dosed in mg/24 hours Place 1 patch (21 mg total) onto the skin daily.   NovoFine 32G X 6 MM Misc Generic drug: Insulin Pen  Needle To use with [place] injections daily   omeprazole 40 MG capsule Commonly known as: PRILOSEC Take 1 capsule (40 mg total) by mouth daily.   potassium chloride 10 MEQ tablet Commonly known as: K-DUR Take 1 tablet (10 mEq total) by mouth 2 (two) times daily.   Toujeo SoloStar 300 UNIT/ML Sopn Generic drug: Insulin Glargine (1 Unit Dial) Inject 15 Units into the skin every evening.   triamcinolone ointment 0.1 % Commonly known as: KENALOG Apply 1 application topically 2 (two) times daily.   True Metrix Air Glucose Meter Devi 1 Device by Does not apply route 2 (two) times daily.   True Metrix Level 1 Low Soln Use as directed   TRUEplus Lancets 33G Misc Use as directed twice daily diag E11.65        DISCHARGE INSTRUCTIONS:  Follow-up with primary care physician in 2 to 3 days Follow-up with cardiology in a week Outpatient cardiopulmonary rehab follow-up in 2 days   DIET:  Cardiac diet and Diabetic diet  DISCHARGE CONDITION:  Fair  ACTIVITY:  Activity as tolerated  OXYGEN:  Home Oxygen: No.   Oxygen Delivery: room air  DISCHARGE LOCATION:  home   If you experience worsening of your admission symptoms, develop shortness of breath, life threatening emergency, suicidal or homicidal thoughts you must seek medical attention immediately by calling 911 or calling your MD immediately  if symptoms less severe.  You Must read complete instructions/literature along with all the possible adverse reactions/side effects for all the Medicines you take and that have been prescribed to you. Take any new Medicines after you have completely understood and accpet all the possible adverse reactions/side effects.   Please note  You were cared for by a hospitalist during your hospital stay. If you have any questions about your discharge medications or the care you received while you were in the hospital after you are discharged, you can call the unit and asked to speak with the  hospitalist on call if the hospitalist that took care of you is not available. Once you are discharged, your primary care physician will handle any further medical issues. Please note that NO REFILLS for any discharge medications will be authorized once you are discharged, as it is imperative that you return to your primary care physician (or establish a relationship with a primary care physician if you do not have one) for your aftercare needs so that they can reassess your need for medications and monitor your lab values.     Today  Chief Complaint  Patient presents with  . Shortness of Breath   Patient is feeling fine.  Denies any dizziness today.  Denies any chest pain or shortness of breath and wants to be discharged  ROS:  CONSTITUTIONAL: Denies fevers, chills. Denies any fatigue, weakness.  EYES: Denies blurry vision, double vision, eye pain. EARS, NOSE, THROAT: Denies tinnitus, ear pain, hearing loss. RESPIRATORY: Denies cough, wheeze, shortness of breath.  CARDIOVASCULAR: Denies chest pain, palpitations, edema.  GASTROINTESTINAL: Denies nausea, vomiting, diarrhea, abdominal pain. Denies bright red blood per rectum. GENITOURINARY: Denies dysuria, hematuria. ENDOCRINE: Denies nocturia or thyroid problems. HEMATOLOGIC AND LYMPHATIC: Denies easy bruising or bleeding. SKIN: Denies rash or lesion. MUSCULOSKELETAL: Denies pain in neck, back, shoulder, knees, hips or arthritic symptoms.  NEUROLOGIC: Denies paralysis, paresthesias.  PSYCHIATRIC: Denies anxiety or depressive symptoms.   VITAL SIGNS:  Blood pressure (!) 176/86, pulse 79, temperature (!) 97.3 F (36.3 C), temperature source Oral, resp. rate 18, height 5\' 11"  (1.803 m), weight 123.3 kg, SpO2 97 %.  I/O:    Intake/Output Summary (Last 24 hours) at 03/05/2019 1412 Last data filed at 03/05/2019 1245 Gross per 24 hour  Intake 480 ml  Output 2875 ml  Net -2395 ml    PHYSICAL EXAMINATION:  GENERAL:  63 y.o.-year-old  patient lying in the bed with no acute distress.  EYES: Pupils equal, round, reactive to light and accommodation. No scleral icterus. Extraocular muscles intact.  HEENT: Head atraumatic, normocephalic. Oropharynx and nasopharynx clear.  NECK:  Supple, no jugular venous distention. No thyroid enlargement, no tenderness.  LUNGS: Normal breath sounds bilaterally, no wheezing, rales,rhonchi or crepitation. No use of accessory muscles of respiration.  CARDIOVASCULAR: S1, S2 normal. No murmurs, rubs, or gallops.  ABDOMEN: Soft, non-tender, non-distended. Bowel sounds present.  EXTREMITIES: No pedal edema, cyanosis, or clubbing.  NEUROLOGIC: Cranial nerves II through XII are intact. Muscle strength 5/5 in all extremities. Sensation intact. Gait not checked.  PSYCHIATRIC: The patient is  alert and oriented x 3.  SKIN: No obvious rash, lesion, or ulcer.   DATA REVIEW:   CBC Recent Labs  Lab 03/05/19 0537  WBC 6.4  HGB 9.3*  HCT 31.8*  PLT 272    Chemistries  Recent Labs  Lab 03/05/19 0537  NA 142  K 4.3  CL 112*  CO2 24  GLUCOSE 159*  BUN 30*  CREATININE 1.57*  CALCIUM 8.8*    Cardiac Enzymes No results for input(s): TROPONINI in the last 168 hours.  Microbiology Results  Results for orders placed or performed during the hospital encounter of 03/01/19  SARS Coronavirus 2 [facility] order, Performed in [facility] hospital lab) Nasopharyngeal Nasopharyngeal Swab     Status: None   Collection Time: 03/01/19  1:33 PM   Specimen: Nasopharyngeal Swab  Result Value Ref Range Status   SARS Coronavirus 2 NEGATIVE NEGATIVE Final    Comment: (NOTE) If result is NEGATIVE SARS-CoV-2 target nucleic acids are NOT DETECTED. The SARS-CoV-2 RNA is generally detectable in upper and lower  respiratory specimens during the acute phase of infection. The lowest  concentration of SARS-CoV-2 viral copies this assay can detect is 250  copies / mL. A negative result does not preclude SARS-CoV-2  infection  and should not be used as the sole basis for treatment or other  patient management decisions.  A negative result may occur with  improper specimen collection / handling, submission of specimen other  than nasopharyngeal swab, presence of viral mutation(s) within the  areas targeted by this assay, and inadequate number of viral copies  (<250 copies / mL). A negative result must be combined with clinical  observations, patient history, and epidemiological information. If result is POSITIVE SARS-CoV-2 target nucleic acids are DETECTED. The SARS-CoV-2 RNA is generally detectable in upper and lower  respiratory specimens dur ing the acute phase of infection.  Positive  results are indicative of active infection with SARS-CoV-2.  Clinical  correlation with patient history and other diagnostic information is  necessary to determine patient infection status.  Positive results do  not rule out bacterial infection or co-infection with other viruses. If result is PRESUMPTIVE POSTIVE SARS-CoV-2 nucleic acids MAY BE PRESENT.   A presumptive positive result was obtained on the submitted specimen  and confirmed on repeat testing.  While 2019 novel coronavirus  (SARS-CoV-2) nucleic acids may be present in the submitted sample  additional confirmatory testing may be necessary for epidemiological  and / or clinical management purposes  to differentiate between  SARS-CoV-2 and other Sarbecovirus currently known to infect humans.  If clinically indicated additional testing with an alternate test  methodology [phone]) is advised. The SARS-CoV-2 RNA is generally  detectable in upper and lower respiratory sp ecimens during the acute  phase of infection. The expected result is Negative. Fact Sheet for Patients:  StrictlyIdeas.no Fact Sheet for Healthcare Providers: BankingDealers.co.za This test is not yet approved or cleared by the [place]  FDA and has been authorized for detection and/or diagnosis of SARS-CoV-2 by FDA under an Emergency Use Authorization (EUA).  This EUA will remain in effect (meaning this test can be used) for the duration of the COVID-19 declaration under Section 564(b)(1) of the Act, 21 U.S.C. section 360bbb-3(b)(1), unless the authorization is terminated or revoked sooner. Performed at [facility], [address] [place], [place] [address]     RADIOLOGY:  Nm Myocar Multi W/spect Tamela Oddi Motion / Ef  Result Date: 03/02/2019  This is a high risk  study due to reduced systolic function. There is no ischemia.  There was no ST segment deviation noted during stress.  The left ventricular ejection fraction is severely decreased (<30%). However the veracity of these findings is unclear given poor tracer uptake.  Resting images are poor due to reduced tracer uptake.  Recommend echo for systolic function assessment.     EKG:   Orders placed or performed during the hospital encounter of 03/01/19  . ED EKG  . ED EKG  . EKG 12-Lead  . EKG 12-Lead  . EKG      Management plans discussed with the patient, family and they are in agreement.  CODE STATUS:     Code Status Orders  (From admission, onward)         Start     Ordered   03/01/19 1535  Full code  Continuous     03/01/19 1536        Code Status History    This patient has a current code status but no historical code status.   Advance Care Planning Activity     TOTAL TIME TAKING CARE OF THIS PATIENT: 45  minutes.   Note: This dictation was prepared with Dragon dictation along with smaller phrase technology. Any transcriptional errors that result from this process are unintentional.   @[facility] @  on 03/05/2019 at 2:12 PM  Between 7am to 6pm - Pager - [phone]  After 6pm go to [email or web address] - password [email or web address] [place] [facility]  Office  [phone]  CC: Primary care physician; [name],  NP

## 2019-03-05 NOTE — Progress Notes (Addendum)
Cardiovascular and Pulmonary Nurse Navigator Note:    63 year male with male with known history of DM II, OSA on CPAP,  Morbid Obesity, HTN who presented  With SOB.  Patient admitted with acute on chronic diastolic CHF; AKI on CKD III with hx of unilateraly kidney s/p nephrectomy from renal cancer; mild COPD exacerbation.    Rounded on patient to review the 5 steps to "Living Better with Heart Failure" with patient.  Patient verbalized understanding of need to weigh himself every morning after urinating and before eating.  Patient to track his weight.  Patient given scale by RN CM.  Patient will be followed by [facility] - RN for further HF education and assessment.  Referral has been placed by MD and CM.  This RN stressed the importance of patient taking medications as prescribed.    Patient to follow low sodium diet.  Education by Dietitian has been provided.   Patient did not have a good understanding of the recommended fluid restriction.  This RN reviewed this with patient.  Patient advised no more than 64 ounces of fluid per day unless otherwise specified by his doctor.  This RN demonstrated this amount using the water pitcher on the overbed table. Patient able to verbally repeat back the amount of 64 ounces / two water pitchers.    Discussed the importance of keeping all appointments.  This RN discussed the appt in the [facility].  Patient was unaware of this appointment and stated, "My sister has already scheduled an appointment for me to be seen by her cardiologist on 02/20/2019."  This RN looked in [place] and patient does have an appt already scheduled with Dr. Rockey Situ on 9/29.  Therefore, the appointment in the [facility] was changed to 03/28/2019 at 11 a.m.  Patient's assigned RN was informed and the discharge navigator was updated.    Patient has follow-up appt with his PCP on 03/07/2019 at 10:30 a.m.    Patient had other questions about when he was going to be discharged and questions  about his inhalers.  [place], patient's assigned RN, informed of patient's questions.    Addendum:  Secure Chat Sent to Darylene Price, FNP in [facility], to evaluate patient for [facility] [facility] when he comes to the HF Clinic.     Roanna Epley, RN, BSN, [place]  Cardiovascular & Pulmonary Nurse Navigator  Direct Line: [phone]  Department Phone #: [phone] Fax: [phone]  Email Address: [email or web address]

## 2019-03-05 NOTE — Discharge Instructions (Signed)
Follow-up with primary care physician in 2 to 3 days Follow-up with cardiology in a week Outpatient cardiopulmonary rehab follow-up in 2 days

## 2019-03-05 NOTE — Progress Notes (Signed)
Nutrition Brief Note   RD received consult for CHF diet education. Pt previously educated this admit (see note from 9/20 ). If pt needs further diet education, consider referral to Nutrition and Diabetes Education Services at [facility]. Phone # [phone]  Koleen Distance MS, RD, [place] Pager #- [phone] Office#- [phone] After Hours Pager: [phone]

## 2019-03-05 NOTE — Progress Notes (Signed)
IV and tele removed from patient. Discharge instructions given to patient. Verbalized understanding however still very forgetful and does not seem to comprehend information completely. Reiterated education. Sister to transport patient home. Will go over instructions with sister as well.

## 2019-03-05 NOTE — TOC Transition Note (Signed)
Transition of Care [facility]) - CM/SW Discharge Note   Patient Details  Name: Larry Callahan MRN: [id number] Date of Birth: 08-Feb-1956  [facility] CM/SW Contact:  Elza Rafter, RN Phone Number: 03/05/2019, 1:31 PM   Clinical Narrative:   Patient is discharging to home today.  Helene Kelp with [facility] is aware for start of services.  Asked MD for [facility] order for RN.  Scale provided. No further needs at this time.      Final next level of care: Home w Home Health Services Barriers to Discharge: No Barriers Identified   Patient Goals and CMS Choice   CMS Medicare.gov Compare Post Acute Care list provided to:: Patient Choice offered to / list presented to : Patient  Discharge Placement                       Discharge Plan and Services   Discharge Planning Services: CM Consult Post Acute Care Choice: Home Health                    HH Arranged: RN [place]: [facility] [employer] Date [place]: 03/03/19 Time [place]: (4:53P) Representative spoke with at [place]: [place] ([place]) Interventions     Readmission Risk Interventions No flowsheet data found.

## 2019-03-05 NOTE — Progress Notes (Signed)
Progress Note  Patient Name: Larry Callahan Date of Encounter: 03/05/2019  Primary Cardiologist: [name], MD   Subjective   Patient reporting improved shortness of breath.  No reported chest pain, palpitations, or racing heart rate.  He no longer complains of dizziness and headache.    He now feels ready to go home.  His only real complaint is of right pedal edema, which started earlier today.  He now feels at his baseline volume status, however.  He reportedly had successful ambulation with PT yesterday.  Inpatient Medications    Scheduled Meds: . amLODipine  5 mg Oral Daily  . aspirin EC  81 mg Oral Daily  . atorvastatin  10 mg Oral q1800  . budesonide (PULMICORT) nebulizer solution  0.5 mg Nebulization BID  . ferrous gluconate  324 mg Oral Q breakfast  . gemfibrozil  600 mg Oral BID WC  . heparin injection (subcutaneous)  5,000 Units Subcutaneous Q8H  . hydrALAZINE  25 mg Oral TID  . insulin aspart  0-5 Units Subcutaneous QHS  . insulin aspart  0-9 Units Subcutaneous TID WC  . insulin glargine  15 Units Subcutaneous QPM  . ipratropium-albuterol  3 mL Nebulization TID  . isosorbide mononitrate  30 mg Oral Daily  . metoprolol succinate  50 mg Oral QHS  . nicotine  21 mg Transdermal Daily  . pantoprazole  40 mg Oral Daily  . potassium chloride  10 mEq Oral BID  . sodium chloride flush  3 mL Intravenous Once  . sodium chloride flush  3 mL Intravenous Q12H   Continuous Infusions: . sodium chloride     PRN Meds: sodium chloride, acetaminophen, clobetasol cream, ondansetron (ZOFRAN) IV, sodium chloride flush   Vital Signs    Vitals:   03/05/19 0756 03/05/19 0924 03/05/19 0927 03/05/19 0929  BP: (!) 169/79 (!) 158/83 (!) 167/73 (!) 176/86  Pulse: 61 63 66 79  Resp:      Temp: (!) 97.3 F (36.3 C)     TempSrc: Oral     SpO2: 97%     Weight:      Height:        Intake/Output Summary (Last 24 hours) at 03/05/2019 1512 Last data filed at 03/05/2019  1428 Gross per 24 hour  Intake 720 ml  Output 2875 ml  Net -2155 ml   Last 3 Weights 03/05/2019 03/04/2019 03/04/2019  Weight (lbs) 271 lb 12.8 oz 271 lb 1.6 oz 275 lb 3.2 oz  Weight (kg) 123.288 kg 122.97 kg 124.83 kg      Telemetry    SR, PACs- Personally Reviewed  ECG    No new tracings- Personally Reviewed  Physical Exam   GEN: No acute distress.  Obese male, sitting in recliner next to bed. Neck: No JVD Cardiac: RRR, distant heart sounds, no murmurs, rubs, or gallops.  Respiratory: Clear to auscultation bilaterally. GI: Soft, nontender, non-distended  MS: Trace to no lower extremity edema; No deformity. Neuro:  Nonfocal  Psych: Normal affect   Labs    High Sensitivity Troponin:   Recent Labs  Lab 02/20/19 1654 02/20/19 1847 03/01/19 0956 03/01/19 1712  TROPONINIHS 14 17 20* 21*      Cardiac EnzymesNo results for input(s): TROPONINI in the last 168 hours. No results for input(s): TROPIPOC in the last 168 hours.   Chemistry Recent Labs  Lab 03/03/19 0532 03/04/19 0505 03/05/19 0537  NA 142 140 142  K 4.3 4.0 4.3  CL 107 107 112*  CO2 27 25 24   GLUCOSE 149* 172* 159*  BUN 26* 28* 30*  CREATININE 1.80* 1.66* 1.57*  CALCIUM 9.0 8.9 8.8*  GFRNONAA 39* 43* 46*  GFRAA 45* 49* 53*  ANIONGAP 8 8 6      Hematology Recent Labs  Lab 03/01/19 0956 03/05/19 0537  WBC 8.7 6.4  RBC 3.87* 3.77*  HGB 9.7* 9.3*  HCT 32.3* 31.8*  MCV 83.5 84.4  MCH 25.1* 24.7*  MCHC 30.0 29.2*  RDW 16.2* 15.7*  PLT 314 272    BNP Recent Labs  Lab 03/01/19 0956  BNP 322.0*     DDimer No results for input(s): DDIMER in the last 168 hours.   Radiology    No results found.  Cardiac Studies   Echo 03/04/2019 Echocardiogram done yesterday: 1. Left ventricular ejection fraction, by visual estimation, is 55 to 60%. The left ventricle has normal function. Normal left ventricular size. Left ventricular septal wall thickness was mildly increased. Mildly increased left  ventricular posterior  wall thickness. There is mildly increased left ventricular hypertrophy. 2. Elevated left ventricular end-diastolic pressure. 3. Left ventricular diastolic Doppler parameters are consistent with pseudonormalization pattern of LV diastolic filling. 4. Global right ventricle has normal systolic function.The right ventricular size is normal. No increase in right ventricular wall thickness. 5. Left atrial size was severely dilated. 6. Right atrial size was normal. 7. The mitral valve is normal in structure. Trace mitral valve regurgitation. No evidence of mitral stenosis. 8. The tricuspid valve is normal in structure. Tricuspid valve regurgitation is trivial. 9. The aortic valve is normal in structure. Aortic valve regurgitation was not visualized by color flow Doppler. Mild aortic valve sclerosis without stenosis. 10. The pulmonic valve was normal in structure. Pulmonic valve regurgitation is trivial by color flow Doppler. 11. Moderately elevated pulmonary artery systolic pressure. 12. The inferior vena cava is dilated in size with >50% respiratory variability, suggesting right atrial pressure of 8 mmHg.  Patient Profile     63 y.o. male with history significant for HFpEF, G2DD with moderate pulmonary HTN, morbid obesity, hypertension, diabetes, tobacco use, CKD, single kidney, OSA on CPAP, and admitted with shortness of breath.  Assessment & Plan    Acute on chronic diastolic heart failure --Feels at his baseline. Euvolemic on exam.  --Net -5L for the admission. Wt 123.5kg  123.3kg.  --Underwent stress test that showed no signs of ischemia.   --Echocardiogram 9/21 confirmed grade 2 diastolic dysfunction with moderate pulmonary hypertension.   --Etiology of exacerbation suspected at least in part 2/2 uncontrolled hypertension and sleep apnea, in addition to morbid obesity.  --Not currently on a diuretic with recommendation for discharge with oral furosemide 40 mg  once daily, as the patient was not taking a diuretic on a regular basis as an outpatient. Will need to monitor compliance, as it has been noted that the patient does not like using his diuretic, as it causes him to urinate. --Recommend start lasix 40mg  once daily po today with follow-up AM BMET. If lasix started today, will need to reassess continued hydralazine at discharge to prevent electrolyte abnormalities.   --ACEi held as below. Consider restart before discharge if renal function allows. Will need to closely monitor renal function with recommendation for outpatient BMET within 3-4 days of discharge as below. Continue metoprolol succinate 50mg  daily, Imdur, and amlodipine. If ACE not restarted before discharge, recommend close outpatient follow-up with restart at that time.   ACOKD --ACEi held for AKI. Cr improved from from 1.80  1.66  1.57.  --Estimated baseline renal function between Cr 1.2 (02/2018) to Cr 1.6 (12/2018). Consider that Cr of 1.5 may be closer to baseline than initially suspected, due to overall progression of CKD in the setting of uncontrolled DM2 as below (last A1C 6.5). --Consider starting oral lasix 40mg  once daily today with KCl supplementation. Will need to reassess hydralazine before discharge. Recommend AM BMET 9/23. If continued improvement in renal function on tomorrow's AM BMET after restart of oral diuretic, can likely restart ACE before discharge with outpatient BMET within the next few days.  Otherwise, restart of ACE to be considered at follow-up.   Essential HTN --BP not well controlled. Consider restart of ACEi before discharge as above and with continued stable renal function after start of oral Lasix 40 mg once daily today. Reassessment of hydralazine before discharge as above. Continue metoprolol, Imdur, and amlodipine.  Nonobstructive CAD --No chest pain.   --Troponin was minimally elevated and flat trending, more consistent with demand ischemia.   --Previous  2011 cath without obstructive disease.  He has left dominant anatomy and known calcification in the LAD.  --Lexiscan Myoview without evidence of ischemia.   --Echo as above with EF 55 to 60%. --Continue medical management with ASA and as above, including statin therapy and gemifibrozil. Las LDL on admission was 98.  DM2 --Per IM, PCP. --Last A1C 9/18 of 6.5.  --Recommend improved glycemic control for factor modification.  Current smoker --Smoking cessation advised.   For questions or updates, please contact [place] Please consult [email or web address] for contact info under        Signed, Arvil Chaco, PA-C  03/05/2019, 3:12 PM

## 2019-03-06 ENCOUNTER — Telehealth: Payer: Self-pay

## 2019-03-06 NOTE — Telephone Encounter (Signed)
Marrianne Mood D, PA-C  P Cv Div [place] Triage   Cc: End, Harrell Gave, MD        Hello,   This patient is being discharged from the hospital today. Please ensure that we have outpatient follow-up scheduled with Dr. Rockey Situ of [facility] within the next 2 weeks to reassess restart of his ACE inhibitor, as well as a follow-up BMET in the next week.   Thank you!  Signed,  Arvil Chaco, PA-C  03/05/2019, 8:05 PM  Pager [phone]

## 2019-03-07 ENCOUNTER — Other Ambulatory Visit: Payer: Self-pay

## 2019-03-07 ENCOUNTER — Encounter: Payer: Self-pay | Admitting: Internal Medicine

## 2019-03-07 ENCOUNTER — Ambulatory Visit (INDEPENDENT_AMBULATORY_CARE_PROVIDER_SITE_OTHER): Payer: Medicare PPO | Admitting: Internal Medicine

## 2019-03-07 VITALS — BP 164/78 | HR 75 | Resp 16 | Ht 71.0 in | Wt 272.0 lb

## 2019-03-07 DIAGNOSIS — J449 Chronic obstructive pulmonary disease, unspecified: Secondary | ICD-10-CM | POA: Diagnosis not present

## 2019-03-07 DIAGNOSIS — Z9989 Dependence on other enabling machines and devices: Secondary | ICD-10-CM | POA: Diagnosis not present

## 2019-03-07 DIAGNOSIS — G4733 Obstructive sleep apnea (adult) (pediatric): Secondary | ICD-10-CM

## 2019-03-07 NOTE — Progress Notes (Signed)
[facility] [place], [place] [address]  Pulmonary Sleep Medicine   Office Visit Note  Patient Name: Larry Callahan DOB: 1955-08-04 MRN [id number]  Date of Service: 03/07/2019  Complaints/HPI: Pt is here for follow up. Pt reports overall he is doing well.  He was hospitalized for heart failure at the beginning of the month.  He appears to be doing well at this time.  Overall he is doing well at this time.  He continues to wear his CPAP nightly.    ROS  General: (-) fever, (-) chills, (-) night sweats, (-) weakness Skin: (-) rashes, (-) itching,. Eyes: (-) visual changes, (-) redness, (-) itching. Nose and Sinuses: (-) nasal stuffiness or itchiness, (-) postnasal drip, (-) nosebleeds, (-) sinus trouble. Mouth and Throat: (-) sore throat, (-) hoarseness. Neck: (-) swollen glands, (-) enlarged thyroid, (-) neck pain. Respiratory: - cough, (-) bloody sputum, - shortness of breath, - wheezing. Cardiovascular: - ankle swelling, (-) chest pain. Lymphatic: (-) lymph node enlargement. Neurologic: (-) numbness, (-) tingling. Psychiatric: (-) anxiety, (-) depression   Current Medication: Outpatient Encounter Medications as of 03/07/2019  Medication Sig  . acetaminophen (TYLENOL) 325 MG tablet Take 2 tablets (650 mg total) by mouth every 4 (four) hours as needed for headache or mild pain.  Marland Kitchen albuterol (VENTOLIN HFA) 108 (90 Base) MCG/ACT inhaler Inhale 2 puffs into the lungs every 6 (six) hours as needed for wheezing or shortness of breath.  . Alcohol Swabs PADS 1 each.  Marland Kitchen amLODipine (NORVASC) 10 MG tablet Take 1 tablet (10 mg total) by mouth daily.  . Blood Glucose Calibration (TRUE METRIX LEVEL 1) Low SOLN Use as directed  . Blood Glucose Monitoring Suppl (TRUE METRIX AIR GLUCOSE METER) DEVI 1 Device by Does not apply route 2 (two) times daily.  . clobetasol cream (TEMOVATE) 4.23 % Apply 1 application topically 2 (two) times daily.  . ferrous gluconate  (FERGON) 324 MG tablet Take 1 tablet (324 mg total) by mouth daily with breakfast.  . Fluticasone-Salmeterol (ADVAIR DISKUS) 250-50 MCG/DOSE AEPB Inhale 1 puff into the lungs 2 (two) times daily.  . furosemide (LASIX) 20 MG tablet Take 2 tablets (40 mg total) by mouth daily with breakfast.  . gemfibrozil (LOPID) 600 MG tablet Take 1 tablet (600 mg total) by mouth 2 (two) times daily with a meal.  . glucose blood (TRUE METRIX BLOOD GLUCOSE TEST) test strip Use as instructed twice daily diag E11.65  . hydrALAZINE (APRESOLINE) 50 MG tablet Take 1 tablet (50 mg total) by mouth 3 (three) times daily.  . Insulin Glargine, 1 Unit Dial, (TOUJEO SOLOSTAR) 300 UNIT/ML SOPN Inject 15 Units into the skin every evening.  . isosorbide mononitrate (IMDUR) 30 MG 24 hr tablet Take 1 tablet (30 mg total) by mouth daily.  . metoprolol succinate (TOPROL-XL) 50 MG 24 hr tablet Take 1 tablet (50 mg total) by mouth at bedtime. Take with or immediately following a meal.  . nicotine (NICODERM CQ - DOSED IN MG/24 HOURS) 21 mg/24hr patch Place 1 patch (21 mg total) onto the skin daily.  Marland Kitchen NOVOFINE 32G X 6 MM MISC To use with [place] injections daily  . omeprazole (PRILOSEC) 40 MG capsule Take 1 capsule (40 mg total) by mouth daily.  Marland Kitchen triamcinolone ointment (KENALOG) 0.1 % Apply 1 application topically 2 (two) times daily.  . TRUEPLUS LANCETS 33G MISC Use as directed twice daily diag E11.65  . aspirin EC 81 MG tablet Take 81 mg by  mouth daily.  Marland Kitchen atorvastatin (LIPITOR) 10 MG tablet Take 1 tablet (10 mg total) by mouth daily. (Patient not taking: Reported on 03/07/2019)  . potassium chloride (K-DUR) 10 MEQ tablet Take 1 tablet (10 mEq total) by mouth 2 (two) times daily. (Patient not taking: Reported on 03/07/2019)   No facility-administered encounter medications on file as of 03/07/2019.     Surgical History: Past Surgical History:  Procedure Laterality Date  . BACK SURGERY    . CHOLECYSTECTOMY    . left arm surgery    .  nephrectomy Left   . PARATHYROIDECTOMY      Medical History: Past Medical History:  Diagnosis Date  . Allergy   . Coronary artery disease   . Diabetes mellitus without complication ([place])   . GERD (gastroesophageal reflux disease)   . Hyperlipidemia   . Hypertension   . Sleep apnea     Family History: Family History  Problem Relation Age of Onset  . Diabetes Mother   . Lung cancer Father   . Diabetes Sister   . Hypertension Sister   . Heart disease Sister   . Cancer Sister   . Bone cancer Brother     Social History: Social History   Socioeconomic History  . Marital status: Single    Spouse name: Not on file  . Number of children: Not on file  . Years of education: Not on file  . Highest education level: Not on file  Occupational History  . Not on file  Social Needs  . Financial resource strain: Not on file  . Food insecurity    Worry: Not on file    Inability: Not on file  . Transportation needs    Medical: Not on file    Non-medical: Not on file  Tobacco Use  . Smoking status: Former Research scientist (life sciences)  . Smokeless tobacco: Former Systems developer    Types: Chew  Substance and Sexual Activity  . Alcohol use: No    Frequency: Never  . Drug use: No  . Sexual activity: Not on file  Lifestyle  . Physical activity    Days per week: Not on file    Minutes per session: Not on file  . Stress: Not on file  Relationships  . Social Herbalist on phone: Not on file    Gets together: Not on file    Attends religious service: Not on file    Active member of club or organization: Not on file    Attends meetings of clubs or organizations: Not on file    Relationship status: Not on file  . Intimate partner violence    Fear of current or ex partner: Not on file    Emotionally abused: Not on file    Physically abused: Not on file    Forced sexual activity: Not on file  Other Topics Concern  . Not on file  Social History Narrative  . Not on file    Vital Signs: Blood  pressure (!) 164/78, pulse 75, resp. rate 16, height 5\' 11"  (1.803 m), weight 272 lb (123.4 kg), SpO2 98 %.  Examination: General Appearance: The patient is well-developed, well-nourished, and in no distress. Skin: Gross inspection of skin unremarkable. Head: normocephalic, no gross deformities. Eyes: no gross deformities noted. ENT: ears appear grossly normal no exudates. Neck: Supple. No thyromegaly. No LAD. Respiratory: clear bilaterally. Cardiovascular: Normal S1 and S2 without murmur or rub. Extremities: No cyanosis. pulses are equal. Neurologic: Alert and oriented. No  involuntary movements.  LABS: Recent Results (from the past 2160 hour(s))  Basic metabolic panel     Status: Abnormal   Collection Time: 12/29/18  7:44 AM  Result Value Ref Range   Sodium 142 135 - 145 mmol/L   Potassium 4.9 3.5 - 5.1 mmol/L   Chloride 111 98 - 111 mmol/L   CO2 24 22 - 32 mmol/L   Glucose, Bld 161 (H) 70 - 99 mg/dL   BUN 26 (H) 8 - 23 mg/dL   Creatinine, Ser 1.63 (H) 0.61 - 1.24 mg/dL   Calcium 9.2 8.9 - 10.3 mg/dL   GFR calc non Af Amer 44 (L) >60 mL/min   GFR calc Af Amer 50 (L) >60 mL/min   Anion gap 7 5 - 15    Comment: Performed at [facility], [address] [place], [place] [address]  CBC with Differential     Status: Abnormal   Collection Time: 12/29/18  7:44 AM  Result Value Ref Range   WBC 7.0 4.0 - 10.5 K/uL   RBC 3.86 (L) 4.22 - 5.81 MIL/uL   Hemoglobin 9.6 (L) 13.0 - 17.0 g/dL   HCT 32.5 (L) 39.0 - 52.0 %   MCV 84.2 80.0 - 100.0 fL   MCH 24.9 (L) 26.0 - 34.0 pg   MCHC 29.5 (L) 30.0 - 36.0 g/dL   RDW 15.6 (H) 11.5 - 15.5 %   Platelets 249 150 - 400 K/uL   nRBC 0.0 0.0 - 0.2 %   Neutrophils Relative % 72 %   Neutro Abs 5.0 1.7 - 7.7 K/uL   Lymphocytes Relative 16 %   Lymphs Abs 1.1 0.7 - 4.0 K/uL   Monocytes Relative 7 %   Monocytes Absolute 0.5 0.1 - 1.0 K/uL   Eosinophils Relative 3 %   Eosinophils Absolute 0.2 0.0 - 0.5 K/uL   Basophils Relative 1 %    Basophils Absolute 0.1 0.0 - 0.1 K/uL   Immature Granulocytes 1 %   Abs Immature Granulocytes 0.05 0.00 - 0.07 K/uL    Comment: Performed at [facility], [place], [place] [address]  Troponin I (High Sensitivity)     Status: None   Collection Time: 12/29/18  7:44 AM  Result Value Ref Range   Troponin I (High Sensitivity) 11 <18 ng/L    Comment: (NOTE) Elevated high sensitivity troponin I (hsTnI) values and significant  changes across serial measurements may suggest ACS but many other  chronic and acute conditions are known to elevate hsTnI results.  Refer to the "Links" section for chest pain algorithms and additional  guidance. Performed at [facility], [place], [place] [address]   Troponin I (High Sensitivity)     Status: None   Collection Time: 12/29/18  9:43 AM  Result Value Ref Range   Troponin I (High Sensitivity) 10 <18 ng/L    Comment: (NOTE) Elevated high sensitivity troponin I (hsTnI) values and significant  changes across serial measurements may suggest ACS but many other  chronic and acute conditions are known to elevate hsTnI results.  Refer to the "Links" section for chest pain algorithms and additional  guidance. Performed at [facility], [address] [place], [place] [address]   POCT Urinalysis Dipstick     Status: Abnormal   Collection Time: 02/20/19  2:04 PM  Result Value Ref Range   Color, UA     Clarity, UA     Glucose, UA Negative Negative  Bilirubin, UA negative    Ketones, UA negative    Spec Grav, UA 1.010 1.010 - 1.025   Blood, UA trace    pH, UA 7.0 5.0 - 8.0   Protein, UA Positive (A) Negative   Urobilinogen, UA 0.2 0.2 or 1.0 E.U./dL   Nitrite, UA negative    Leukocytes, UA Negative Negative   Appearance     Odor    Basic metabolic panel     Status: Abnormal   Collection Time: 02/20/19  4:54 PM  Result Value Ref Range   Sodium 140 135 - 145 mmol/L   Potassium 4.3 3.5 -  5.1 mmol/L    Comment: HEMOLYSIS AT THIS LEVEL MAY AFFECT RESULT   Chloride 108 98 - 111 mmol/L   CO2 21 (L) 22 - 32 mmol/L   Glucose, Bld 172 (H) 70 - 99 mg/dL   BUN 24 (H) 8 - 23 mg/dL   Creatinine, Ser 1.58 (H) 0.61 - 1.24 mg/dL   Calcium 8.8 (L) 8.9 - 10.3 mg/dL   GFR calc non Af Amer 45 (L) >60 mL/min   GFR calc Af Amer 52 (L) >60 mL/min   Anion gap 11 5 - 15    Comment: Performed at [facility], [address] [place], [place] [address]  CBC     Status: Abnormal   Collection Time: 02/20/19  4:54 PM  Result Value Ref Range   WBC 8.9 4.0 - 10.5 K/uL   RBC 3.52 (L) 4.22 - 5.81 MIL/uL   Hemoglobin 9.0 (L) 13.0 - 17.0 g/dL   HCT 29.8 (L) 39.0 - 52.0 %   MCV 84.7 80.0 - 100.0 fL   MCH 25.6 (L) 26.0 - 34.0 pg   MCHC 30.2 30.0 - 36.0 g/dL   RDW 16.4 (H) 11.5 - 15.5 %   Platelets 164 150 - 400 K/uL   nRBC 0.0 0.0 - 0.2 %    Comment: Performed at [facility], [place], [place] [address]  Troponin I (High Sensitivity)     Status: None   Collection Time: 02/20/19  4:54 PM  Result Value Ref Range   Troponin I (High Sensitivity) 14 <18 ng/L    Comment: (NOTE) Elevated high sensitivity troponin I (hsTnI) values and significant  changes across serial measurements may suggest ACS but many other  chronic and acute conditions are known to elevate hsTnI results.  Refer to the "Links" section for chest pain algorithms and additional  guidance. Performed at [facility], [address] [place], [place] [address]   Novel Coronavirus, NAA [facility] order, Send-out to Ref Lab; TAT 18-24 hrs     Status: None   Collection Time: 02/20/19  5:05 PM   Specimen: Nasopharyngeal Swab; Respiratory  Result Value Ref Range   SARS-CoV-2, NAA NOT DETECTED NOT DETECTED    Comment: (NOTE) This nucleic acid amplification test was developed and its performance characteristics determined by [employer]. Nucleic acid amplification tests include PCR and  TMA. This test has not been FDA cleared or approved. This test has been authorized by FDA under an Emergency Use Authorization (EUA). This test is only authorized for the duration of time the declaration that circumstances exist justifying the authorization of the emergency use of in vitro diagnostic tests for detection of SARS-CoV-2 virus and/or diagnosis of COVID-19 infection under section 564(b)(1) of the Act, 21 U.S.C. 784ONG-2(X) (1), unless the authorization is terminated or revoked sooner. When diagnostic testing is negative, the possibility of  a false negative result should be considered in the context of a patient's recent exposures and the presence of clinical signs and symptoms consistent with COVID-19. An individual without symptoms of COVID- 19 and who is not shedding SARS-CoV-2 vi rus would expect to have a negative (not detected) result in this assay. Performed At: [facility] [address] [place], [place] [id number] Rush Farmer MD [id number]    Coronavirus Source NASOPHARYNGEAL     Comment: Performed at [facility], [place], [place] [address]  Troponin I (High Sensitivity)     Status: None   Collection Time: 02/20/19  6:47 PM  Result Value Ref Range   Troponin I (High Sensitivity) 17 <18 ng/L    Comment: (NOTE) Elevated high sensitivity troponin I (hsTnI) values and significant  changes across serial measurements may suggest ACS but many other  chronic and acute conditions are known to elevate hsTnI results.  Refer to the "Links" section for chest pain algorithms and additional  guidance. Performed at [facility], [address] [place], [place] [address]   Basic metabolic panel     Status: Abnormal   Collection Time: 03/01/19  9:56 AM  Result Value Ref Range   Sodium 142 135 - 145 mmol/L   Potassium 4.0 3.5 - 5.1 mmol/L   Chloride 112 (H) 98 - 111 mmol/L   CO2 21 (L) 22 - 32 mmol/L   Glucose, Bld 121  (H) 70 - 99 mg/dL   BUN 27 (H) 8 - 23 mg/dL   Creatinine, Ser 1.52 (H) 0.61 - 1.24 mg/dL   Calcium 9.4 8.9 - 10.3 mg/dL   GFR calc non Af Amer 47 (L) >60 mL/min   GFR calc Af Amer 55 (L) >60 mL/min   Anion gap 9 5 - 15    Comment: Performed at [facility], [address] [place], [place] [address]  CBC     Status: Abnormal   Collection Time: 03/01/19  9:56 AM  Result Value Ref Range   WBC 8.7 4.0 - 10.5 K/uL   RBC 3.87 (L) 4.22 - 5.81 MIL/uL   Hemoglobin 9.7 (L) 13.0 - 17.0 g/dL   HCT 32.3 (L) 39.0 - 52.0 %   MCV 83.5 80.0 - 100.0 fL   MCH 25.1 (L) 26.0 - 34.0 pg   MCHC 30.0 30.0 - 36.0 g/dL   RDW 16.2 (H) 11.5 - 15.5 %   Platelets 314 150 - 400 K/uL   nRBC 0.0 0.0 - 0.2 %    Comment: Performed at [facility], [place], [place] [address]  Troponin I (High Sensitivity)     Status: Abnormal   Collection Time: 03/01/19  9:56 AM  Result Value Ref Range   Troponin I (High Sensitivity) 20 (H) <18 ng/L    Comment: (NOTE) Elevated high sensitivity troponin I (hsTnI) values and significant  changes across serial measurements may suggest ACS but many other  chronic and acute conditions are known to elevate hsTnI results.  Refer to the "Links" section for chest pain algorithms and additional  guidance. Performed at [facility], [address] [place], [place] [address]   Brain natriuretic peptide     Status: Abnormal   Collection Time: 03/01/19  9:56 AM  Result Value Ref Range   B Natriuretic Peptide 322.0 (H) 0.0 - 100.0 pg/mL    Comment: Performed at [facility], [address] [place], [place] [address]  SARS Coronavirus 2 (  Hospital order, Performed in [facility] hospital lab) Nasopharyngeal Nasopharyngeal Swab     Status: None   Collection Time: 03/01/19  1:33 PM   Specimen: Nasopharyngeal Swab  Result Value Ref Range   SARS Coronavirus 2 NEGATIVE NEGATIVE    Comment: (NOTE) If result is NEGATIVE SARS-CoV-2 target  nucleic acids are NOT DETECTED. The SARS-CoV-2 RNA is generally detectable in upper and lower  respiratory specimens during the acute phase of infection. The lowest  concentration of SARS-CoV-2 viral copies this assay can detect is 250  copies / mL. A negative result does not preclude SARS-CoV-2 infection  and should not be used as the sole basis for treatment or other  patient management decisions.  A negative result may occur with  improper specimen collection / handling, submission of specimen other  than nasopharyngeal swab, presence of viral mutation(s) within the  areas targeted by this assay, and inadequate number of viral copies  (<250 copies / mL). A negative result must be combined with clinical  observations, patient history, and epidemiological information. If result is POSITIVE SARS-CoV-2 target nucleic acids are DETECTED. The SARS-CoV-2 RNA is generally detectable in upper and lower  respiratory specimens dur ing the acute phase of infection.  Positive  results are indicative of active infection with SARS-CoV-2.  Clinical  correlation with patient history and other diagnostic information is  necessary to determine patient infection status.  Positive results do  not rule out bacterial infection or co-infection with other viruses. If result is PRESUMPTIVE POSTIVE SARS-CoV-2 nucleic acids MAY BE PRESENT.   A presumptive positive result was obtained on the submitted specimen  and confirmed on repeat testing.  While 2019 novel coronavirus  (SARS-CoV-2) nucleic acids may be present in the submitted sample  additional confirmatory testing may be necessary for epidemiological  and / or clinical management purposes  to differentiate between  SARS-CoV-2 and other Sarbecovirus currently known to infect humans.  If clinically indicated additional testing with an alternate test  methodology [phone]) is advised. The SARS-CoV-2 RNA is generally  detectable in upper and lower  respiratory sp ecimens during the acute  phase of infection. The expected result is Negative. Fact Sheet for Patients:  StrictlyIdeas.no Fact Sheet for Healthcare Providers: BankingDealers.co.za This test is not yet approved or cleared by the [place] FDA and has been authorized for detection and/or diagnosis of SARS-CoV-2 by FDA under an Emergency Use Authorization (EUA).  This EUA will remain in effect (meaning this test can be used) for the duration of the COVID-19 declaration under Section 564(b)(1) of the Act, 21 U.S.C. section 360bbb-3(b)(1), unless the authorization is terminated or revoked sooner. Performed at [facility], [place], [place] [address]   Troponin I (High Sensitivity)     Status: Abnormal   Collection Time: 03/01/19  5:12 PM  Result Value Ref Range   Troponin I (High Sensitivity) 21 (H) <18 ng/L    Comment: (NOTE) Elevated high sensitivity troponin I (hsTnI) values and significant  changes across serial measurements may suggest ACS but many other  chronic and acute conditions are known to elevate hsTnI results.  Refer to the "Links" section for chest pain algorithms and additional  guidance. Performed at [facility], [address] [place], [place] [address]   HIV antibody (Routine Testing)     Status: None   Collection Time: 03/01/19  5:12 PM  Result Value Ref Range   HIV Screen 4th Generation wRfx Non Reactive Non Reactive  Comment: (NOTE) Performed At: [facility] [place], [place] [id number] Rush Farmer MD [id number]   Hemoglobin A1c     Status: Abnormal   Collection Time: 03/01/19  5:12 PM  Result Value Ref Range   Hgb A1c MFr Bld 6.5 (H) 4.8 - 5.6 %    Comment: (NOTE) Pre diabetes:          5.7%-6.4% Diabetes:              >6.4% Glycemic control for   <7.0% adults with diabetes    Mean Plasma Glucose 139.85 mg/dL     Comment: Performed at [facility] [address], [place], [place] [address]  Ferritin     Status: Abnormal   Collection Time: 03/01/19  5:12 PM  Result Value Ref Range   Ferritin 5 (L) 24 - 336 ng/mL    Comment: Performed at [facility] [address] [place], [place] [address]  Iron and TIBC     Status: Abnormal   Collection Time: 03/01/19  5:12 PM  Result Value Ref Range   Iron 22 (L) 45 - 182 ug/dL   TIBC 439 250 - 450 ug/dL   Saturation Ratios 5 (L) 17.9 - 39.5 %   UIBC 417 ug/dL    Comment: Performed at [facility], [address] [place], [place] [address]  Glucose, capillary     Status: None   Collection Time: 03/01/19  5:14 PM  Result Value Ref Range   Glucose-Capillary 73 70 - 99 mg/dL  Glucose, capillary     Status: Abnormal   Collection Time: 03/01/19  8:20 PM  Result Value Ref Range   Glucose-Capillary 131 (H) 70 - 99 mg/dL  Glucose, capillary     Status: Abnormal   Collection Time: 03/02/19  4:03 AM  Result Value Ref Range   Glucose-Capillary 179 (H) 70 - 99 mg/dL  Basic metabolic panel     Status: Abnormal   Collection Time: 03/02/19  5:53 AM  Result Value Ref Range   Sodium 143 135 - 145 mmol/L   Potassium 3.8 3.5 - 5.1 mmol/L   Chloride 108 98 - 111 mmol/L   CO2 26 22 - 32 mmol/L   Glucose, Bld 160 (H) 70 - 99 mg/dL   BUN 27 (H) 8 - 23 mg/dL   Creatinine, Ser 1.65 (H) 0.61 - 1.24 mg/dL   Calcium 9.3 8.9 - 10.3 mg/dL   GFR calc non Af Amer 43 (L) >60 mL/min   GFR calc Af Amer 50 (L) >60 mL/min   Anion gap 9 5 - 15    Comment: Performed at [facility], [address] [place], [place] [address]  Lipid panel     Status: Abnormal   Collection Time: 03/02/19  5:53 AM  Result Value Ref Range   Cholesterol 166 0 - 200 mg/dL   Triglycerides 166 (H) <150 mg/dL   HDL 35 (L) >40 mg/dL   Total CHOL/HDL Ratio 4.7 RATIO   VLDL 33 0 - 40 mg/dL   LDL Cholesterol 98 0 - 99 mg/dL    Comment:        Total Cholesterol/HDL:CHD  Risk Coronary Heart Disease Risk Table                     Men   Women  1/2 Average Risk   3.4   3.3  Average Risk       5.0   4.4  2 X Average Risk   9.6   7.1  3 X Average Risk  23.4   11.0        Use the calculated Patient Ratio above and the CHD Risk Table to determine the patient's CHD Risk.        ATP III CLASSIFICATION (LDL):  <100     mg/dL   Optimal  100-129  mg/dL   Near or Above                    Optimal  130-159  mg/dL   Borderline  160-189  mg/dL   High  >190     mg/dL   Very High Performed at [facility], [address] [place], [place] [address]   Glucose, capillary     Status: Abnormal   Collection Time: 03/02/19  8:04 AM  Result Value Ref Range   Glucose-Capillary 138 (H) 70 - 99 mg/dL   Comment 1 Notify RN    Comment 2 Document in Chart   NM Myocar Multi W/Spect W/Wall Motion / EF     Status: None   Collection Time: 03/02/19 11:45 AM  Result Value Ref Range   SSS 6    SRS 16    SDS 2    TID 1.79    LV sys vol 94 mL   LV dias vol 173 62 - 150 mL  Glucose, capillary     Status: Abnormal   Collection Time: 03/02/19 12:05 PM  Result Value Ref Range   Glucose-Capillary 104 (H) 70 - 99 mg/dL   Comment 1 Notify RN    Comment 2 Document in Chart   Glucose, capillary     Status: Abnormal   Collection Time: 03/02/19  4:33 PM  Result Value Ref Range   Glucose-Capillary 174 (H) 70 - 99 mg/dL   Comment 1 Notify RN    Comment 2 Document in Chart   Glucose, capillary     Status: Abnormal   Collection Time: 03/02/19  8:56 PM  Result Value Ref Range   Glucose-Capillary 161 (H) 70 - 99 mg/dL  Basic metabolic panel     Status: Abnormal   Collection Time: 03/03/19  5:32 AM  Result Value Ref Range   Sodium 142 135 - 145 mmol/L   Potassium 4.3 3.5 - 5.1 mmol/L   Chloride 107 98 - 111 mmol/L   CO2 27 22 - 32 mmol/L   Glucose, Bld 149 (H) 70 - 99 mg/dL   BUN 26 (H) 8 - 23 mg/dL   Creatinine, Ser 1.80 (H) 0.61 - 1.24 mg/dL   Calcium 9.0 8.9 - 10.3  mg/dL   GFR calc non Af Amer 39 (L) >60 mL/min   GFR calc Af Amer 45 (L) >60 mL/min   Anion gap 8 5 - 15    Comment: Performed at [facility], [address] [place],  [address]  Glucose, capillary     Status: Abnormal   Collection Time: 03/03/19  8:03 AM  Result Value Ref Range   Glucose-Capillary 126 (H) 70 - 99 mg/dL   Comment 1 Notify RN    Comment 2 Document in Chart   Glucose, capillary     Status: Abnormal   Collection Time: 03/03/19 12:17 PM  Result Value Ref Range   Glucose-Capillary 209 (H) 70 - 99 mg/dL   Comment 1 Notify RN    Comment 2 Document in Chart   ECHOCARDIOGRAM COMPLETE  Status: None   Collection Time: 03/03/19 12:56 PM  Result Value Ref Range   Weight 4,316.8 oz   Height 71 in   BP 153/71 mmHg  Glucose, capillary     Status: Abnormal   Collection Time: 03/03/19  4:32 PM  Result Value Ref Range   Glucose-Capillary 167 (H) 70 - 99 mg/dL   Comment 1 Notify RN    Comment 2 Document in Chart   Glucose, capillary     Status: Abnormal   Collection Time: 03/03/19  8:11 PM  Result Value Ref Range   Glucose-Capillary 185 (H) 70 - 99 mg/dL  Basic metabolic panel     Status: Abnormal   Collection Time: 03/04/19  5:05 AM  Result Value Ref Range   Sodium 140 135 - 145 mmol/L   Potassium 4.0 3.5 - 5.1 mmol/L   Chloride 107 98 - 111 mmol/L   CO2 25 22 - 32 mmol/L   Glucose, Bld 172 (H) 70 - 99 mg/dL   BUN 28 (H) 8 - 23 mg/dL   Creatinine, Ser 1.66 (H) 0.61 - 1.24 mg/dL   Calcium 8.9 8.9 - 10.3 mg/dL   GFR calc non Af Amer 43 (L) >60 mL/min   GFR calc Af Amer 49 (L) >60 mL/min   Anion gap 8 5 - 15    Comment: Performed at [facility], [address] [place], [place] [address]  Glucose, capillary     Status: Abnormal   Collection Time: 03/04/19  8:03 AM  Result Value Ref Range   Glucose-Capillary 154 (H) 70 - 99 mg/dL  Glucose, capillary     Status: Abnormal   Collection Time: 03/04/19 11:36 AM  Result Value Ref Range    Glucose-Capillary 290 (H) 70 - 99 mg/dL  Glucose, capillary     Status: Abnormal   Collection Time: 03/04/19 11:39 AM  Result Value Ref Range   Glucose-Capillary 280 (H) 70 - 99 mg/dL  Glucose, capillary     Status: Abnormal   Collection Time: 03/04/19  4:51 PM  Result Value Ref Range   Glucose-Capillary 139 (H) 70 - 99 mg/dL  Glucose, capillary     Status: Abnormal   Collection Time: 03/04/19  7:43 PM  Result Value Ref Range   Glucose-Capillary 224 (H) 70 - 99 mg/dL  Basic metabolic panel     Status: Abnormal   Collection Time: 03/05/19  5:37 AM  Result Value Ref Range   Sodium 142 135 - 145 mmol/L   Potassium 4.3 3.5 - 5.1 mmol/L   Chloride 112 (H) 98 - 111 mmol/L   CO2 24 22 - 32 mmol/L   Glucose, Bld 159 (H) 70 - 99 mg/dL   BUN 30 (H) 8 - 23 mg/dL   Creatinine, Ser 1.57 (H) 0.61 - 1.24 mg/dL   Calcium 8.8 (L) 8.9 - 10.3 mg/dL   GFR calc non Af Amer 46 (L) >60 mL/min   GFR calc Af Amer 53 (L) >60 mL/min   Anion gap 6 5 - 15    Comment: Performed at [facility], [address] [place], [place] [address]  CBC     Status: Abnormal   Collection Time: 03/05/19  5:37 AM  Result Value Ref Range   WBC 6.4 4.0 - 10.5 K/uL   RBC 3.77 (L) 4.22 - 5.81 MIL/uL   Hemoglobin 9.3 (L) 13.0 - 17.0 g/dL   HCT 31.8 (L) 39.0 - 52.0 %   MCV 84.4 80.0 - 100.0 fL  MCH 24.7 (L) 26.0 - 34.0 pg   MCHC 29.2 (L) 30.0 - 36.0 g/dL   RDW 15.7 (H) 11.5 - 15.5 %   Platelets 272 150 - 400 K/uL   nRBC 0.0 0.0 - 0.2 %    Comment: Performed at [facility], [address] [place], [place] [address]  Glucose, capillary     Status: Abnormal   Collection Time: 03/05/19  7:56 AM  Result Value Ref Range   Glucose-Capillary 148 (H) 70 - 99 mg/dL  Glucose, capillary     Status: Abnormal   Collection Time: 03/05/19 12:18 PM  Result Value Ref Range   Glucose-Capillary 299 (H) 70 - 99 mg/dL    Radiology: No results found.  No results found.  Dg Chest 2 View  Result Date:  03/01/2019 CLINICAL DATA:  Patient with shortness of breath. EXAM: CHEST - 2 VIEW COMPARISON:  Chest radiograph February 20, 2019 FINDINGS: Stable cardiac and mediastinal contours. Aortic tortuosity. Pulmonary vascular redistribution and mild interstitial opacities. Additionally, there is patchy consolidation within the right lower lung. No pleural effusion or pneumothorax. Thoracic spine degenerative changes. IMPRESSION: Patchy consolidation right mid and lower lung may represent atelectasis or infection. Cardiomegaly with pulmonary vascular redistribution. Electronically Signed   By: Lovey Newcomer M.D.   On: 03/01/2019 10:26   Dg Chest 2 View  Result Date: 02/20/2019 CLINICAL DATA:  Shortness of breath EXAM: CHEST - 2 VIEW COMPARISON:  12/29/2018 FINDINGS: Cardiac shadow is stable. The lungs are well aerated bilaterally. No focal infiltrate or sizable effusion is seen. Degenerative changes of the thoracic spine are noted. IMPRESSION: No active cardiopulmonary disease. Electronically Signed   By: Inez Catalina M.D.   On: 02/20/2019 15:00   Ct Angio Chest Pe W And/or Wo Contrast  Result Date: 02/20/2019 CLINICAL DATA:  Short of breath. High pretest probability for pulmonary embolus. EXAM: CT ANGIOGRAPHY CHEST WITH CONTRAST TECHNIQUE: Multidetector CT imaging of the chest was performed using the standard protocol during bolus administration of intravenous contrast. Multiplanar CT image reconstructions and MIPs were obtained to evaluate the vascular anatomy. CONTRAST:  38mL OMNIPAQUE IOHEXOL 350 MG/ML SOLN COMPARISON:  03/06/2013 FINDINGS: Cardiovascular: Normal heart size. No pericardial effusion. Aortic atherosclerosis and LAD and left circumflex coronary artery calcifications. The main pulmonary artery is patent. No saddle embolus or central obstructing pulmonary embolus. Patent lobar pulmonary arteries. No segmental pulmonary artery filling defects. Mediastinum/Nodes: No enlarged mediastinal, hilar, or axillary  lymph nodes. Thyroid gland, trachea, and esophagus demonstrate no significant findings. Lungs/Pleura: No pleural effusion identified. No airspace consolidation, atelectasis or pneumothorax. Upper Abdomen: No acute abnormality noted within the upper abdomen. Previous cholecystectomy. Musculoskeletal: No chest wall abnormality. No acute or significant osseous findings. Thoracic spondylosis noted. Review of the MIP images confirms the above findings. IMPRESSION: 1. No evidence for acute pulmonary embolus. 2.  Aortic Atherosclerosis (ICD10-I70.0). 3. Coronary artery calcifications. Electronically Signed   By: Kerby Moors M.D.   On: 02/20/2019 19:08   Nm Myocar Multi W/spect W/wall Motion / Ef  Result Date: 03/02/2019  This is a high risk study due to reduced systolic function. There is no ischemia.  There was no ST segment deviation noted during stress.  The left ventricular ejection fraction is severely decreased (<30%). However the veracity of these findings is unclear given poor tracer uptake.  Resting images are poor due to reduced tracer uptake.  Recommend echo for systolic function assessment.    Dg Chest Port 1 View  Result Date: 02/20/2019 CLINICAL  DATA:  63 year old male with shortness of breath. EXAM: PORTABLE CHEST 1 VIEW COMPARISON:  Earlier chest radiograph dated 02/20/2019. FINDINGS: There is mild cardiomegaly with vascular congestion, progressed since the prior radiograph. No focal consolidation, pleural effusion, or pneumothorax. No acute osseous pathology. IMPRESSION: Mild cardiomegaly with vascular congestion, worsened since the earlier radiograph. No focal consolidation. Electronically Signed   By: Anner Crete M.D.   On: 02/20/2019 17:20      Assessment and Plan: Patient Active Problem List   Diagnosis Date Noted  . Acute on chronic heart failure with preserved ejection fraction (HFpEF) ([place])   . Acute kidney injury superimposed on chronic kidney disease ([place])   . CHF  (congestive heart failure) ([place]) 03/01/2019  . Diastolic dysfunction 44/81/8563  . Iron deficiency anemia 12/31/2018  . Atopic dermatitis 11/24/2018  . Screening for colon cancer 11/06/2017  . Uncontrolled type 2 diabetes mellitus with hyperglycemia ([place]) 11/06/2017  . Dermatitis, unspecified 06/09/2017  . Atherosclerotic heart disease of native coronary artery without angina pectoris 06/09/2017  . 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 Obstructive sleep apnea, adult 06/09/2017  . Morbid (severe) obesity due to excess calories ([place]) 06/09/2017  . Cigarette nicotine dependence with nicotine-induced disorder 06/09/2017  . Type 2 diabetes mellitus with diabetic polyneuropathy ([place]) 06/09/2017  . Allergic rhinitis due to pollen 06/09/2017  . Mixed hyperlipidemia 06/09/2017  . Chronic obstructive pulmonary disease, unspecified ([place]) 06/09/2017  . Dyspnea on exertion 06/09/2017  . Wheezing 06/09/2017  . Dysuria 06/09/2017  . Snoring 06/09/2017  . Essential hypertension 06/09/2017  . Personal history of other malignant neoplasm of kidney 06/09/2017  . Pain in right hip 06/09/2017  . Impacted cerumen, bilateral 06/09/2017  . Secondary hyperparathyroidism, not elsewhere classified ([place]) 06/09/2017  . Type 2 diabetes mellitus with hyperglycemia ([place]) 06/09/2017  . Tinea corporis 06/09/2017    1. OSA on CPAP Continue to use cpap as directed. Follow up accordingly.   2. Chronic obstructive pulmonary disease, unspecified COPD type ([place]) Pt will need nebulizer for ongoing intermittent, sob.  - For home use only DME Nebulizer machine  3. Morbid obesity (HCC) Obesity Counseling: Risk Assessment: An assessment of behavioral risk factors was made today and includes lack of exercise sedentary lifestyle, lack of portion control and poor dietary habits.  Risk Modification Advice: She was counseled on portion control guidelines. Restricting daily caloric intake  to. . The detrimental long term effects of obesity on her health and ongoing poor compliance was also discussed with the patient.    General Counseling: I have discussed the findings of the evaluation and examination with Jeneen Rinks.  I have also discussed any further diagnostic evaluation thatmay be needed or ordered today. Omari verbalizes understanding of the findings of todays visit. We also reviewed his medications today and discussed drug interactions and side effects including but not limited excessive drowsiness and altered mental states. We also discussed that there is always a risk not just to him but also people around him. he has been encouraged to call the office with any questions or concerns that should arise related to todays visit.    Time spent: 15 This patient was seen by Orson Gear AGNP-C in Collaboration with Dr. Devona Konig as a part of collaborative care agreement.   I have personally obtained a history, examined the patient, evaluated laboratory and imaging results, formulated the assessment and plan and placed orders.    [name], MD [facility] Sleep medicine

## 2019-03-08 ENCOUNTER — Telehealth: Payer: Self-pay

## 2019-03-08 ENCOUNTER — Other Ambulatory Visit: Payer: Self-pay | Admitting: Adult Health

## 2019-03-08 MED ORDER — IPRATROPIUM-ALBUTEROL 0.5-2.5 (3) MG/3ML IN SOLN: 3.0000 mL | Freq: Four times a day (QID) | RESPIRATORY_TRACT | 2 refills | Status: DC | PRN

## 2019-03-08 NOTE — Telephone Encounter (Signed)
Patient given nebulizer machine from office , paperwork faxed to [facility]

## 2019-03-11 ENCOUNTER — Telehealth: Payer: Self-pay

## 2019-03-11 ENCOUNTER — Ambulatory Visit: Payer: Medicare PPO | Admitting: Family

## 2019-03-11 NOTE — Progress Notes (Signed)
Cardiology Office Note  Date:  03/12/2019   ID:  Larry Callahan, Larry Callahan 09-20-1955, MRN [id number]  PCP:  [name], NP   Chief Complaint  Patient presents with  . New Patient (Initial Visit)    ED follow up for SOB, chest pain, and CHF. Meds reviewed verbally with patient.     HPI:  Larry Callahan is a 63 y.o. male with a hx of  morbid obesity,  hypertension poorly controlled,  type 2 diabetes  smoker,  chronic renal insufficiency,  single kidney,  obstructive sleep apnea on CPAP,    presenting to the emergency room September 2020 with shortness of breath on exertion Referred by Larry Callahan for shortness of breath, diastolic dysfunction  Recently hospital for diastolic CHF, hypertensive heart disease  In follow-up today Overall feeling much better, family presents with him today reports he is doing well Weight at home: 260s, does not know 267 here today  Sugars running high in the Am metoformin-glyburide held in the hospital for renal dysfunction  Quit smoking few months ago  EKG personally reviewed by myself on todays visit Shows normal sinus rhythm with rate 79 bpm nonspecific T wave abnormality 1 and aVL   Details of recent hospitalization reviewed with him several recent trips to the emergency room for similar symptoms of shortness of breath on exertion  Recent work-up February 20, 2019 for shortness of breath   seen December 29, 2018 for similar symptoms of shortness of breath on exertion, at that time hemoglobin 9.6, creatinine 1.63  Blood pressure typically running high Presented to the hospital with midsternal nonradiating chest pain And hypertension   echocardiogram July 2020 Report not in the computer Per primary care office notes had normal LV function  left heart catheterization 2011 Nonobstructive disease at that time Nondominant RCA  Diagnosed with hypertensive heart disease with diastolic CHF, underlying COPD given  long history of smoking  morbid obesity, BNP 322 Treated with IV Lasix  CT scan chest with no PE   significant coronary calcification through the LAD Unable to exclude underlying ischemia, unable several risk factors including poorly controlled diabetes --Stress test performed showing no ischemia  Blood pressure was 431 systolic in the emergency room Medications changed in the hospital  Hemoglobin A1c elevated  Chronic renal sufficiency 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  PSH:    Past Surgical History:  Procedure Laterality Date  . BACK SURGERY    . CHOLECYSTECTOMY    . left arm surgery    . nephrectomy Left   . PARATHYROIDECTOMY      Current Outpatient Medications  Medication Sig Dispense Refill  . amLODipine (NORVASC) 10 MG tablet Take 1 tablet (10 mg total) by mouth daily. 30 tablet 0  . aspirin EC 81 MG tablet Take 81 mg by mouth daily.    . ferrous gluconate (FERGON) 324 MG tablet Take 1 tablet (324 mg total) by mouth daily with breakfast. 30 tablet 3  . furosemide (LASIX) 20 MG tablet Take 2 tablets (40 mg total) by mouth daily with breakfast. 30 tablet 0  . hydrALAZINE (APRESOLINE) 50 MG tablet Take 1 tablet (50 mg total) by mouth 3 (three) times daily. 90 tablet 0  . isosorbide  mononitrate (IMDUR) 30 MG 24 hr tablet Take 1 tablet (30 mg total) by mouth daily. 30 tablet 0  . metoprolol succinate (TOPROL-XL) 50 MG 24 hr tablet Take 1 tablet (50 mg total) by mouth at bedtime. Take with or immediately following a meal. 90 tablet 1  . omeprazole (PRILOSEC) 40 MG capsule Take 1 capsule (40 mg total) by mouth daily. 90 capsule 1  .  acetaminophen (TYLENOL) 325 MG tablet Take 2 tablets (650 mg total) by mouth every 4 (four) hours as needed for headache or mild pain.    Marland Kitchen albuterol (VENTOLIN HFA) 108 (90 Base) MCG/ACT inhaler Inhale 2 puffs into the lungs every 6 (six) hours as needed for wheezing or shortness of breath. 18 g 0  . Alcohol Swabs PADS 1 each.    . Blood Glucose Calibration (TRUE METRIX LEVEL 1) Low SOLN Use as directed 1 each 1  . Blood Glucose Monitoring Suppl (TRUE METRIX AIR GLUCOSE METER) DEVI 1 Device by Does not apply route 2 (two) times daily. 1 Device 0  . clobetasol cream (TEMOVATE) 6.76 % Apply 1 application topically 2 (two) times daily. 60 g 1  . Fluticasone-Salmeterol (ADVAIR DISKUS) 250-50 MCG/DOSE AEPB Inhale 1 puff into the lungs 2 (two) times daily. 60 each 0  . gemfibrozil (LOPID) 600 MG tablet Take 1 tablet (600 mg total) by mouth 2 (two) times daily with a meal. 180 tablet 1  . glucose blood (TRUE METRIX BLOOD GLUCOSE TEST) test strip Use as instructed twice daily diag E11.65 100 each 3  . Insulin Glargine, 1 Unit Dial, (TOUJEO SOLOSTAR) 300 UNIT/ML SOPN Inject 15 Units into the skin every evening. 3 pen 5  . ipratropium-albuterol (DUONEB) 0.5-2.5 (3) MG/3ML SOLN Take 3 mLs by nebulization every 6 (six) hours as needed. 1080 mL 2  . nicotine (NICODERM CQ - DOSED IN MG/24 HOURS) 21 mg/24hr patch Place 1 patch (21 mg total) onto the skin daily. 28 patch 0  . NOVOFINE 32G X 6 MM MISC To use with [place] injections daily 100 each 4  . triamcinolone ointment (KENALOG) 0.1 % Apply 1 application topically 2 (two) times daily. 80 g 2  . TRUEPLUS LANCETS 33G MISC Use as directed twice daily diag E11.65 100 each 3   No current facility-administered medications for this visit.      Allergies:   Penicillins   Social History:  The patient  reports that he has quit smoking. He has quit using smokeless tobacco.  His smokeless tobacco use included chew. He reports that he does not drink alcohol or use drugs.    Family History:   family history includes Bone cancer in his brother; Cancer in his sister; Diabetes in his mother and sister; Heart disease in his sister; Hypertension in his sister; Lung cancer in his father.    Review of Systems: Review of Systems  Constitutional: Negative.   HENT: Negative.   Respiratory: Negative.   Cardiovascular: Negative.   Gastrointestinal: Negative.   Musculoskeletal: Negative.   Neurological: Negative.   Psychiatric/Behavioral: Negative.   All other systems reviewed and are negative.    PHYSICAL EXAM: VS:  BP (!) 144/70 (BP Location: Right Arm, Patient Position: Sitting, Cuff Size: Normal)   Pulse 79   Ht 5\' 11"  (1.803 m)   Wt 267 lb 8 oz (121.3 kg)   BMI 37.31 kg/m  , BMI Body mass index is 37.31 kg/m. GEN: Well nourished, well developed, in no acute distress obese HEENT: normal Neck: no JVD,  carotid bruits, or masses Cardiac: RRR; no murmurs, rubs, or gallops,no edema  Respiratory:  clear to auscultation bilaterally, normal work of breathing GI: soft, nontender, nondistended, + BS MS: no deformity or atrophy Skin: warm and dry, no rash Neuro:  Strength and sensation are intact Psych: euthymic mood, full affect  Recent Labs: 03/01/2019: B Natriuretic Peptide 322.0 03/05/2019: BUN 30; Creatinine, Ser 1.57; Hemoglobin 9.3; Platelets 272; Potassium 4.3; Sodium 142    Lipid Panel Lab Results  Component Value Date   CHOL 166 03/02/2019   HDL 35 (L) 03/02/2019   LDLCALC 98 03/02/2019   TRIG 166 (H) 03/02/2019      Wt Readings from Last 3 Encounters:  03/12/19 267 lb 8 oz (121.3 kg)  03/07/19 272 lb (123.4 kg)  03/05/19 271 lb 12.8 oz (123.3 kg)       ASSESSMENT AND PLAN:  Problem List Items Addressed This Visit      Cardiology Problems   CHF (congestive heart failure) ([place]) - Primary   Essential hypertension     Other   Acute kidney injury superimposed on chronic kidney disease (HCC)   Atherosclerotic heart disease of  native coronary artery without angina pectoris   Chronic obstructive pulmonary disease, unspecified (HCC)   Type 2 diabetes mellitus with hyperglycemia (HCC)     Diastolic CHF, hypertensive heart disease Blood pressure much better Appears euvolemic if not prerenal We will decrease Lasix down to 20 mg daily, he has cut way back on his fluid intake For weight 270 pounds or higher recommend he go back on Lasix 40 Recommend daily weights  Essential hypertension Continue current medication regiment Medications discussed with him in detail Timing of when to take each pill  CAD Stressed importance of aggressive diabetes control and lipid control Stress test with no ischemia performed in the hospital  Type 2 diabetes Reports that sugars are running high in the morning Metformin glyburide held in the hospital for renal dysfunction Scheduled to see primary care tomorrow  Disposition:   F/U  6 months   Total encounter time more than 45 minutes  Greater than 50% was spent in counseling and coordination of care with the patient    Signed, Esmond Plants, M.D., Ph.D. [place], [place]

## 2019-03-12 ENCOUNTER — Other Ambulatory Visit: Payer: Self-pay

## 2019-03-12 ENCOUNTER — Encounter: Payer: Self-pay | Admitting: Cardiovascular Disease

## 2019-03-12 ENCOUNTER — Ambulatory Visit (INDEPENDENT_AMBULATORY_CARE_PROVIDER_SITE_OTHER): Payer: Medicare PPO | Admitting: Nurse Practitioner

## 2019-03-12 ENCOUNTER — Ambulatory Visit (INDEPENDENT_AMBULATORY_CARE_PROVIDER_SITE_OTHER): Payer: Medicare PPO | Admitting: Cardiovascular Disease

## 2019-03-12 ENCOUNTER — Ambulatory Visit: Payer: Medicare PPO | Admitting: Cardiovascular Disease

## 2019-03-12 ENCOUNTER — Encounter: Payer: Self-pay | Admitting: Adult Health

## 2019-03-12 VITALS — BP 150/82 | HR 76 | Resp 16 | Ht 71.0 in | Wt 268.0 lb

## 2019-03-12 VITALS — BP 144/70 | HR 79 | Ht 71.0 in | Wt 267.5 lb

## 2019-03-12 DIAGNOSIS — I10 Essential (primary) hypertension: Secondary | ICD-10-CM

## 2019-03-12 DIAGNOSIS — R0602 Shortness of breath: Secondary | ICD-10-CM | POA: Diagnosis not present

## 2019-03-12 DIAGNOSIS — I5189 Other ill-defined heart diseases: Secondary | ICD-10-CM

## 2019-03-12 DIAGNOSIS — E1165 Type 2 diabetes mellitus with hyperglycemia: Secondary | ICD-10-CM

## 2019-03-12 DIAGNOSIS — N189 Chronic kidney disease, unspecified: Secondary | ICD-10-CM

## 2019-03-12 DIAGNOSIS — Z794 Long term (current) use of insulin: Secondary | ICD-10-CM

## 2019-03-12 DIAGNOSIS — N179 Acute kidney failure, unspecified: Secondary | ICD-10-CM

## 2019-03-12 DIAGNOSIS — J449 Chronic obstructive pulmonary disease, unspecified: Secondary | ICD-10-CM

## 2019-03-12 DIAGNOSIS — IMO0002 Reserved for concepts with insufficient information to code with codable children: Secondary | ICD-10-CM | POA: Insufficient documentation

## 2019-03-12 DIAGNOSIS — I2510 Atherosclerotic heart disease of native coronary artery without angina pectoris: Secondary | ICD-10-CM

## 2019-03-12 DIAGNOSIS — D509 Iron deficiency anemia, unspecified: Secondary | ICD-10-CM | POA: Diagnosis not present

## 2019-03-12 DIAGNOSIS — Z1211 Encounter for screening for malignant neoplasm of colon: Secondary | ICD-10-CM

## 2019-03-12 DIAGNOSIS — I5032 Chronic diastolic (congestive) heart failure: Secondary | ICD-10-CM | POA: Diagnosis not present

## 2019-03-12 LAB — GLUCOSE, POCT (MANUAL RESULT ENTRY): POC Glucose: 275 mg/dl — AB (ref 70–99)

## 2019-03-12 MED ORDER — ISOSORBIDE MONONITRATE ER 30 MG PO TB24: 30.0000 mg | ORAL_TABLET | Freq: Every day | ORAL | 2 refills | Status: DC

## 2019-03-12 MED ORDER — HYDRALAZINE HCL 50 MG PO TABS: 50.0000 mg | ORAL_TABLET | Freq: Three times a day (TID) | ORAL | 2 refills | Status: DC

## 2019-03-12 MED ORDER — AMLODIPINE BESYLATE 10 MG PO TABS: 10.0000 mg | ORAL_TABLET | Freq: Every day | ORAL | 2 refills | Status: DC

## 2019-03-12 MED ORDER — FUROSEMIDE 20 MG PO TABS: ORAL_TABLET | ORAL | 2 refills | Status: DC

## 2019-03-12 MED ORDER — METOPROLOL SUCCINATE ER 50 MG PO TB24: 50.0000 mg | ORAL_TABLET | Freq: Every day | ORAL | 2 refills | Status: DC

## 2019-03-12 NOTE — Patient Instructions (Addendum)
Medication Instructions:  Please take lasix 20 mg daily for weight 269 lbs or less Please take lasix 40 mg (2 pills) for weight 270 or higher   Refill meds 90 days, refills   If you need a refill on your cardiac medications before your next appointment, please call your pharmacy.    Lab work: No new labs needed   If you have labs (blood work) drawn today and your tests are completely normal, you will receive your results only by: Marland Kitchen MyChart Message (if you have MyChart) OR . A paper copy in the mail If you have any lab test that is abnormal or we need to change your treatment, we will call you to review the results.   Testing/Procedures: No new testing needed   Follow-Up: At [facility], you and your health needs are our priority.  As part of our continuing mission to provide you with exceptional heart care, we have created designated Provider Care Teams.  These Care Teams include your primary Cardiologist (physician) and Advanced Practice Providers (APPs -  Physician Assistants and Nurse Practitioners) who all work together to provide you with the care you need, when you need it.  . You will need a follow up appointment in 6 months .   Please call our office 2 months in advance to schedule this appointment.    . Providers on your designated Care Team:   . Murray Hodgkins, NP . Christell Faith, PA-C . Marrianne Mood, PA-C  Any Other Special Instructions Will Be Listed Below (If Applicable).  For educational health videos Log in to : [email or web address] Or : [email or web address], password : [email or web address]

## 2019-03-12 NOTE — Progress Notes (Signed)
[facility] [place], [place] [address]  Internal MEDICINE  Office Visit Note  Patient Name: Larry Callahan  [id number]  [id number]  Date of Service: 03/12/2019   Pt is here for recent hospital follow up.   Chief Complaint  Patient presents with  . Medical Management of Chronic Issues    medication confusion, hospital changed diabetic medication , blood sugars are high and unable to take 15 units with the toujeo, taking 14 units at night, question about taken atorvastatin 10mg ?   . Diabetes    blood sugar in office 275   . Hyperlipidemia     The patient is here for hospital follow up. He was recently hospitalized for shortness of breath. Was found to be in congestive heart failure. While hospitalized, blood sugar was elevated, renal functions were off, and he was found to be anemic. Patient had been referred for screening colonoscopy earlier in the year, however, he cancelled this appointment. Several medication changes were made. He was taken off oral medication for diabetes. Is now taking inly toujeo at 14 units, as pen does not read 15 units. Blood sugars are elevated .    Current Medication: Outpatient Encounter Medications as of 03/12/2019  Medication Sig  . acetaminophen (TYLENOL) 325 MG tablet Take 2 tablets (650 mg total) by mouth every 4 (four) hours as needed for headache or mild pain.  Marland Kitchen albuterol (VENTOLIN HFA) 108 (90 Base) MCG/ACT inhaler Inhale 2 puffs into the lungs every 6 (six) hours as needed for wheezing or shortness of breath.  . Alcohol Swabs PADS 1 each.  Marland Kitchen amLODipine (NORVASC) 10 MG tablet Take 1 tablet (10 mg total) by mouth daily.  Marland Kitchen aspirin EC 81 MG tablet Take 81 mg by mouth daily.  . Blood Glucose Calibration (TRUE METRIX LEVEL 1) Low SOLN Use as directed  . Blood Glucose Monitoring Suppl (TRUE METRIX AIR GLUCOSE METER) DEVI 1 Device by Does not apply route 2 (two) times daily.  . clobetasol cream (TEMOVATE) 3.23 % Apply  1 application topically 2 (two) times daily.  . ferrous gluconate (FERGON) 324 MG tablet Take 1 tablet (324 mg total) by mouth daily with breakfast.  . Fluticasone-Salmeterol (ADVAIR DISKUS) 250-50 MCG/DOSE AEPB Inhale 1 puff into the lungs 2 (two) times daily.  . furosemide (LASIX) 20 MG tablet Take 20 mg (1 tab) by mouth once a day for weight 269 lb or less.  Take 40 mg (2 tabs) by mouth once a day for weight 270 lb or higher.  Marland Kitchen gemfibrozil (LOPID) 600 MG tablet Take 1 tablet (600 mg total) by mouth 2 (two) times daily with a meal.  . glucose blood (TRUE METRIX BLOOD GLUCOSE TEST) test strip Use as instructed twice daily diag E11.65  . hydrALAZINE (APRESOLINE) 50 MG tablet Take 1 tablet (50 mg total) by mouth 3 (three) times daily.  . Insulin Glargine, 1 Unit Dial, (TOUJEO SOLOSTAR) 300 UNIT/ML SOPN Inject 15 Units into the skin every evening. (Patient taking differently: Inject 15 Units into the skin every evening. 14 units)  . ipratropium-albuterol (DUONEB) 0.5-2.5 (3) MG/3ML SOLN Take 3 mLs by nebulization every 6 (six) hours as needed.  . isosorbide mononitrate (IMDUR) 30 MG 24 hr tablet Take 1 tablet (30 mg total) by mouth daily.  . metoprolol succinate (TOPROL-XL) 50 MG 24 hr tablet Take 1 tablet (50 mg total) by mouth at bedtime. Take with or immediately following a meal.  . nicotine (NICODERM CQ - DOSED IN MG/24  HOURS) 21 mg/24hr patch Place 1 patch (21 mg total) onto the skin daily.  Marland Kitchen NOVOFINE 32G X 6 MM MISC To use with [place] injections daily  . omeprazole (PRILOSEC) 40 MG capsule Take 1 capsule (40 mg total) by mouth daily.  Marland Kitchen triamcinolone ointment (KENALOG) 0.1 % Apply 1 application topically 2 (two) times daily.  . TRUEPLUS LANCETS 33G MISC Use as directed twice daily diag E11.65  . [DISCONTINUED] amLODipine (NORVASC) 10 MG tablet Take 1 tablet (10 mg total) by mouth daily.  . [DISCONTINUED] furosemide (LASIX) 20 MG tablet Take 2 tablets (40 mg total) by mouth daily with breakfast.   . [DISCONTINUED] hydrALAZINE (APRESOLINE) 50 MG tablet Take 1 tablet (50 mg total) by mouth 3 (three) times daily.  . [DISCONTINUED] isosorbide mononitrate (IMDUR) 30 MG 24 hr tablet Take 1 tablet (30 mg total) by mouth daily.  . [DISCONTINUED] metoprolol succinate (TOPROL-XL) 50 MG 24 hr tablet Take 1 tablet (50 mg total) by mouth at bedtime. Take with or immediately following a meal.   No facility-administered encounter medications on file as of 03/12/2019.     Surgical History: Past Surgical History:  Procedure Laterality Date  . BACK SURGERY    . CHOLECYSTECTOMY    . left arm surgery    . nephrectomy Left   . PARATHYROIDECTOMY      Medical History: Past Medical History:  Diagnosis Date  . Allergy   . Coronary artery disease   . Diabetes mellitus without complication ([place])   . GERD (gastroesophageal reflux disease)   . Hyperlipidemia   . Hypertension   . Sleep apnea     Family History: Family History  Problem Relation Age of Onset  . Diabetes Mother   . Lung cancer Father   . Diabetes Sister   . Hypertension Sister   . Heart disease Sister   . Cancer Sister   . Bone cancer Brother     Social History   Socioeconomic History  . Marital status: Single    Spouse name: Not on file  . Number of children: Not on file  . Years of education: Not on file  . Highest education level: Not on file  Occupational History  . Not on file  Social Needs  . Financial resource strain: Not on file  . Food insecurity    Worry: Not on file    Inability: Not on file  . Transportation needs    Medical: Not on file    Non-medical: Not on file  Tobacco Use  . Smoking status: Former Research scientist (life sciences)  . Smokeless tobacco: Former Systems developer    Types: Chew  Substance and Sexual Activity  . Alcohol use: No    Frequency: Never  . Drug use: No  . Sexual activity: Not on file  Lifestyle  . Physical activity    Days per week: Not on file    Minutes per session: Not on file  . Stress: Not on  file  Relationships  . Social Herbalist on phone: Not on file    Gets together: Not on file    Attends religious service: Not on file    Active member of club or organization: Not on file    Attends meetings of clubs or organizations: Not on file    Relationship status: Not on file  . Intimate partner violence    Fear of current or ex partner: Not on file    Emotionally abused: Not on file  Physically abused: Not on file    Forced sexual activity: Not on file  Other Topics Concern  . Not on file  Social History Narrative  . Not on file      Review of Systems  Constitutional: Positive for fatigue. Negative for chills and unexpected weight change.  HENT: Negative for congestion, postnasal drip, rhinorrhea, sneezing and sore throat.   Respiratory: Positive for shortness of breath and wheezing. Negative for cough and chest tightness.        Symptoms have improved since his recent stay in hospital.   Cardiovascular: Negative for chest pain and palpitations.       Recent hospitalization for congestive heart failure.   Gastrointestinal: Negative for abdominal pain, constipation, diarrhea, nausea and vomiting.  Endocrine: Negative for cold intolerance, heat intolerance, polydipsia and polyuria.       Blood sugars are running moderately elevated. Only taking toujeo 14 units. Was taken off oral medications during hospitalization due to renal dysfunction.   Musculoskeletal: Negative for arthralgias, back pain, joint swelling and neck pain.  Skin: Negative for rash.  Neurological: Negative for dizziness, tremors, numbness and headaches.  Hematological: Negative for adenopathy. Does not bruise/bleed easily.       Patient found to be anemic during his hospitalization.   Psychiatric/Behavioral: Negative for behavioral problems (Depression), sleep disturbance and suicidal ideas. The patient is not nervous/anxious.     Today's Vitals   03/12/19 1120  BP: (!) 150/82  Pulse: 76   Resp: 16  SpO2: 95%  Weight: 268 lb (121.6 kg)  Height: 5\' 11"  (1.803 m)   Body mass index is 37.38 kg/m.  Physical Exam Vitals signs and nursing note reviewed.  Constitutional:      General: He is not in acute distress.    Appearance: Normal appearance. He is well-developed. He is obese. He is not diaphoretic.  HENT:     Head: Normocephalic and atraumatic.     Mouth/Throat:     Pharynx: No oropharyngeal exudate.  Eyes:     Pupils: Pupils are equal, round, and reactive to light.  Neck:     Musculoskeletal: Normal range of motion and neck supple.     Thyroid: No thyromegaly.     Vascular: No carotid bruit or JVD.     Trachea: No tracheal deviation.  Cardiovascular:     Rate and Rhythm: Normal rate and regular rhythm.     Heart sounds: Normal heart sounds. No murmur. No friction rub. No gallop.   Pulmonary:     Effort: Pulmonary effort is normal. No respiratory distress.     Breath sounds: Normal breath sounds. No wheezing or rales.  Chest:     Chest wall: No tenderness.  Abdominal:     Palpations: Abdomen is soft.  Musculoskeletal: Normal range of motion.  Lymphadenopathy:     Cervical: No cervical adenopathy.  Skin:    General: Skin is warm and dry.  Neurological:     Mental Status: He is alert and oriented to person, place, and time. Mental status is at baseline.     Cranial Nerves: No cranial nerve deficit.  Psychiatric:        Behavior: Behavior normal.        Thought Content: Thought content normal.        Judgment: Judgment normal.   Assessment/Plan: 1. Uncontrolled type 2 diabetes mellitus with insulin therapy ([place]) - POCT glucose (manual entry) 275 today. Advised patient to increase toujeo to 20 units every day. Sample provided  and demonstrated proper dosing for him. He voiced understanding. Will  Check blood sugars at least daily. Bring blood sugar log with him to next visit.   2. Chronic diastolic congestive heart failure ([place]) Recent follow up with Dr.  Nehemiah Massed this morning. Patient doing well. Was taken off atorvastatin. gontinue gemfibrozil twice daily.   3. Acute kidney injury superimposed on chronic kidney disease ([place]) Multiple medication changes made in the hospital due to acute kidney injury. chages were reviewed and verified for the patient and updated in his medication list.   4. Iron deficiency anemia, unspecified iron deficiency anemia type Patient should continue with daily iron supplement. Will refer to GI for further evaluation of anemia. Consider referral to hematology for further evaluation.  - Ambulatory referral to Gastroenterology  5. Screening for colon cancer - Ambulatory referral to Gastroenterology  General Counseling: christain niznik understanding of the findings of todays visit and agrees with plan of treatment. I have discussed any further diagnostic evaluation that may be needed or ordered today. We also reviewed his medications today. he has been encouraged to call the office with any questions or concerns that should arise related to todays visit.    Counseling:  Diabetes Counseling:  1. Addition of ACE inh/ ARB'S for nephroprotection. Microalbumin is updated  2. Diabetic foot care, prevention of complications. Podiatry consult 3. Exercise and lose weight.  4. Diabetic eye examination, Diabetic eye exam is updated  5. Monitor blood sugar closlely. nutrition counseling.  6. Sign and symptoms of hypoglycemia including shaking sweating,confusion and headaches.  This patient was seen by Leretha Pol FNP Collaboration with Dr Lavera Guise as a part of collaborative care agreement  Orders Placed This Encounter  Procedures  . Ambulatory referral to Gastroenterology  . POCT glucose (manual entry)      I have reviewed all medical records from hospital follow up including radiology reports and consults from other physicians. Appropriate follow up diagnostics will be scheduled as needed. Patient/ Family  understands the plan of treatment. Time spent 30 minutes.   Dr Lavera Guise, MD Internal Medicine

## 2019-03-13 ENCOUNTER — Other Ambulatory Visit: Payer: Self-pay | Admitting: Nurse Practitioner

## 2019-03-13 ENCOUNTER — Telehealth: Payer: Self-pay

## 2019-03-13 DIAGNOSIS — E1165 Type 2 diabetes mellitus with hyperglycemia: Secondary | ICD-10-CM

## 2019-03-13 DIAGNOSIS — R0609 Other forms of dyspnea: Secondary | ICD-10-CM

## 2019-03-13 DIAGNOSIS — I5032 Chronic diastolic (congestive) heart failure: Secondary | ICD-10-CM

## 2019-03-13 DIAGNOSIS — IMO0002 Reserved for concepts with insufficient information to code with codable children: Secondary | ICD-10-CM

## 2019-03-13 DIAGNOSIS — R4189 Other symptoms and signs involving cognitive functions and awareness: Secondary | ICD-10-CM

## 2019-03-13 MED ORDER — GLIMEPIRIDE 2 MG PO TABS: 2.0000 mg | ORAL_TABLET | Freq: Every day | ORAL | 3 refills | Status: DC

## 2019-03-13 MED ORDER — TOUJEO SOLOSTAR 300 UNIT/ML ~~LOC~~ SOPN: 26.0000 [IU] | PEN_INJECTOR | Freq: Every evening | SUBCUTANEOUS | 5 refills | Status: DC

## 2019-03-13 NOTE — Telephone Encounter (Signed)
lmom to nick from [facility] that  we place order for home health for skilled musing and medication management

## 2019-03-13 NOTE — Telephone Encounter (Signed)
Patient advised that we are going to increase his insulin from 20 uinits to 26 units and called in glimepiride 2mg  and take in morning, his sugar was 300 this morning and came down to 275 so we will add meds and add order for home nurse to check weekly to help get pt's sugar under control. Larry Callahan

## 2019-03-13 NOTE — Progress Notes (Signed)
increased toujeo to 26 units daily and added glimepiride 2mg  every morning. Continue to monitor his blood sugars every day like he is. I have also added home health order for medication management and skilled nursing for the patient.

## 2019-03-14 ENCOUNTER — Emergency Department
Admission: EM | Admit: 2019-03-14 | Discharge: 2019-03-14 | Disposition: A | Discharge status: Home / Self Care | Payer: Medicare PPO | Attending: Emergency Medicine | Admitting: Emergency Medicine

## 2019-03-14 ENCOUNTER — Emergency Department: Payer: Medicare PPO

## 2019-03-14 ENCOUNTER — Other Ambulatory Visit: Payer: Self-pay

## 2019-03-14 DIAGNOSIS — Z8679 Personal history of other diseases of the circulatory system: Secondary | ICD-10-CM | POA: Diagnosis not present

## 2019-03-14 DIAGNOSIS — Z794 Long term (current) use of insulin: Secondary | ICD-10-CM | POA: Diagnosis not present

## 2019-03-14 DIAGNOSIS — R0600 Dyspnea, unspecified: Secondary | ICD-10-CM | POA: Diagnosis not present

## 2019-03-14 DIAGNOSIS — Z87891 Personal history of nicotine dependence: Secondary | ICD-10-CM | POA: Insufficient documentation

## 2019-03-14 DIAGNOSIS — I491 Atrial premature depolarization: Secondary | ICD-10-CM | POA: Diagnosis not present

## 2019-03-14 DIAGNOSIS — I10 Essential (primary) hypertension: Secondary | ICD-10-CM | POA: Insufficient documentation

## 2019-03-14 DIAGNOSIS — E119 Type 2 diabetes mellitus without complications: Secondary | ICD-10-CM | POA: Diagnosis not present

## 2019-03-14 DIAGNOSIS — E1165 Type 2 diabetes mellitus with hyperglycemia: Secondary | ICD-10-CM

## 2019-03-14 DIAGNOSIS — I2510 Atherosclerotic heart disease of native coronary artery without angina pectoris: Secondary | ICD-10-CM | POA: Diagnosis not present

## 2019-03-14 DIAGNOSIS — G4489 Other headache syndrome: Secondary | ICD-10-CM | POA: Diagnosis not present

## 2019-03-14 DIAGNOSIS — R0602 Shortness of breath: Secondary | ICD-10-CM | POA: Diagnosis not present

## 2019-03-14 DIAGNOSIS — IMO0002 Reserved for concepts with insufficient information to code with codable children: Secondary | ICD-10-CM

## 2019-03-14 DIAGNOSIS — R609 Edema, unspecified: Secondary | ICD-10-CM | POA: Diagnosis not present

## 2019-03-14 LAB — CBC WITH DIFFERENTIAL/PLATELET
Abs Immature Granulocytes: 0.04 10*3/uL (ref 0.00–0.07)
Basophils Absolute: 0.1 10*3/uL (ref 0.0–0.1)
Basophils Relative: 1 %
Eosinophils Absolute: 0.1 10*3/uL (ref 0.0–0.5)
Eosinophils Relative: 1 %
HCT: 34.1 % — ABNORMAL LOW (ref 39.0–52.0)
Hemoglobin: 10.5 g/dL — ABNORMAL LOW (ref 13.0–17.0)
Immature Granulocytes: 1 %
Lymphocytes Relative: 20 %
Lymphs Abs: 1.8 10*3/uL (ref 0.7–4.0)
MCH: 25.2 pg — ABNORMAL LOW (ref 26.0–34.0)
MCHC: 30.8 g/dL (ref 30.0–36.0)
MCV: 81.8 fL (ref 80.0–100.0)
Monocytes Absolute: 0.7 10*3/uL (ref 0.1–1.0)
Monocytes Relative: 7 %
Neutro Abs: 6.2 10*3/uL (ref 1.7–7.7)
Neutrophils Relative %: 70 %
Platelets: 301 10*3/uL (ref 150–400)
RBC: 4.17 MIL/uL — ABNORMAL LOW (ref 4.22–5.81)
RDW: 16.4 % — ABNORMAL HIGH (ref 11.5–15.5)
WBC: 8.8 10*3/uL (ref 4.0–10.5)
nRBC: 0 % (ref 0.0–0.2)

## 2019-03-14 LAB — COMPREHENSIVE METABOLIC PANEL
ALT: 17 U/L (ref 0–44)
AST: 18 U/L (ref 15–41)
Albumin: 3.4 g/dL — ABNORMAL LOW (ref 3.5–5.0)
Alkaline Phosphatase: 65 U/L (ref 38–126)
Anion gap: 11 (ref 5–15)
BUN: 35 mg/dL — ABNORMAL HIGH (ref 8–23)
CO2: 22 mmol/L (ref 22–32)
Calcium: 9.8 mg/dL (ref 8.9–10.3)
Chloride: 107 mmol/L (ref 98–111)
Creatinine, Ser: 1.99 mg/dL — ABNORMAL HIGH (ref 0.61–1.24)
GFR calc Af Amer: 40 mL/min — ABNORMAL LOW (ref >60)
GFR calc non Af Amer: 34 mL/min — ABNORMAL LOW (ref >60)
Glucose, Bld: 157 mg/dL — ABNORMAL HIGH (ref 70–99)
Potassium: 4.1 mmol/L (ref 3.5–5.1)
Sodium: 140 mmol/L (ref 135–145)
Total Bilirubin: 0.5 mg/dL (ref 0.3–1.2)
Total Protein: 7.5 g/dL (ref 6.5–8.1)

## 2019-03-14 LAB — BRAIN NATRIURETIC PEPTIDE: B Natriuretic Peptide: 102 pg/mL — ABNORMAL HIGH (ref 0.0–100.0)

## 2019-03-14 LAB — TROPONIN I (HIGH SENSITIVITY): Troponin I (High Sensitivity): 17 ng/L (ref <18)

## 2019-03-14 LAB — GLUCOSE, CAPILLARY: Glucose-Capillary: 116 mg/dL — ABNORMAL HIGH (ref 70–99)

## 2019-03-14 MED ORDER — OXYCODONE-ACETAMINOPHEN 5-325 MG PO TABS
1.0000 | ORAL_TABLET | Freq: Once | ORAL | Status: AC
  Administered 2019-03-14: 1 via ORAL
  Filled 2019-03-14: qty 1

## 2019-03-14 NOTE — ED Notes (Signed)
Walked patient up and down hall monitoring Stats. Beginning walk O2 reading : 94%, during walk O2 reading 96%, arriving to room O2 97%. Pt complained of SOB during walk and could not walk far. HR increased to 114 during walk and arriving back to room.AS

## 2019-03-14 NOTE — ED Provider Notes (Signed)
-----------------------------------------   3:03 PM on 03/14/2019 -----------------------------------------  Blood pressure (!) 136/97, pulse 92, temperature 98.9 F (37.2 C), temperature source Oral, resp. rate (!) 22, height 5\' 11"  (1.803 m), weight 122 kg, SpO2 98 %.  Assuming care from Dr. Jimmye Norman.  In short, Larry Callahan is a 63 y.o. male with a chief complaint of Shortness of Breath .  Refer to the original H&P for additional details.  The current plan of care is to follow-up workup for some SOB, concern for overdiuresis.  Chest x-ray negative for acute process, troponin within normal limits, BNP only mildly elevated.  Do not suspect any significant CHF exacerbation, patient continues to be comfortable with appropriate O2 sats on room air.  Kidney function comparable to prior, counseled patient to follow-up with his PCP as well as cardiology.  Patient agrees with plan.    Blake Divine, MD 03/14/19 [phone]

## 2019-03-14 NOTE — ED Notes (Signed)
Pt given cup of water and phone to call his sister

## 2019-03-14 NOTE — ED Notes (Signed)
Pt given cup of water 

## 2019-03-14 NOTE — ED Triage Notes (Signed)
Pt arrives via EMS from home after having [facility]- pt states his blood sugar is also high- per EMS CBG 217- was recently in the hospital for leg swelling- pt states that he was switched to a fluid pill and that he thinks it is messing up his kidney- states "I'm peeing too much, I don't need to be peeing like that"

## 2019-03-14 NOTE — ED Provider Notes (Signed)
[facility] Emergency Department Provider Note       Time seen: ----------------------------------------- 2:21 PM on 03/14/2019 -----------------------------------------   I have reviewed the triage vital signs and the nursing notes.  HISTORY   Chief Complaint Shortness of Breath    HPI Larry Callahan is a 63 y.o. male with a history of coronary artery disease, diabetes, GERD, hyperlipidemia, hypertension, sleep apnea who presents to the ED for shortness of breath.  Patient states his blood sugar is also high although it was only 217.  He was in the hospital recently for leg swelling and states they changed his diuretic.  Patient feels like he is urinating more than normal and is concerned about his kidney function.  He describes 10 out of 10 headache.  Past Medical History:  Diagnosis Date  . Allergy   . Coronary artery disease   . Diabetes mellitus without complication ([place])   . GERD (gastroesophageal reflux disease)   . Hyperlipidemia   . Hypertension   . Sleep apnea     Patient Active Problem List   Diagnosis Date Noted  . Uncontrolled type 2 diabetes mellitus with insulin therapy ([place]) 03/12/2019  . Acute on chronic heart failure with preserved ejection fraction (HFpEF) ([place])   . Acute kidney injury superimposed on chronic kidney disease ([place])   . CHF (congestive heart failure) ([place]) 03/01/2019  . Diastolic dysfunction 50/35/4656  . Iron deficiency anemia 12/31/2018  . Atopic dermatitis 11/24/2018  . Screening for colon cancer 11/06/2017  . Uncontrolled type 2 diabetes mellitus with hyperglycemia ([place]) 11/06/2017  . Dermatitis, unspecified 06/09/2017  . Atherosclerotic heart disease of native coronary artery without angina pectoris 06/09/2017  . 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 Obstructive sleep apnea, adult 06/09/2017  . Morbid (severe) obesity due to excess calories ([place]) 06/09/2017  .  Cigarette nicotine dependence with nicotine-induced disorder 06/09/2017  . Type 2 diabetes mellitus with diabetic polyneuropathy ([place]) 06/09/2017  . Allergic rhinitis due to pollen 06/09/2017  . Mixed hyperlipidemia 06/09/2017  . Chronic obstructive pulmonary disease, unspecified ([place]) 06/09/2017  . Dyspnea on exertion 06/09/2017  . Wheezing 06/09/2017  . Dysuria 06/09/2017  . Snoring 06/09/2017  . Essential hypertension 06/09/2017  . Personal history of other malignant neoplasm of kidney 06/09/2017  . Pain in right hip 06/09/2017  . Impacted cerumen, bilateral 06/09/2017  . Secondary hyperparathyroidism, not elsewhere classified ([place]) 06/09/2017  . Type 2 diabetes mellitus with hyperglycemia ([place]) 06/09/2017  . Tinea corporis 06/09/2017    Past Surgical History:  Procedure Laterality Date  . BACK SURGERY    . CHOLECYSTECTOMY    . left arm surgery    . nephrectomy Left   . PARATHYROIDECTOMY      Allergies Penicillins  Social History Social History   Tobacco Use  . Smoking status: Former Research scientist (life sciences)  . Smokeless tobacco: Former Systems developer    Types: Chew  Substance Use Topics  . Alcohol use: No    Frequency: Never  . Drug use: No   Review of Systems Constitutional: Negative for fever. Cardiovascular: Negative for chest pain. Respiratory: Positive for shortness of breath Gastrointestinal: Negative for abdominal pain, vomiting and diarrhea. Musculoskeletal: Negative for back pain. Skin: Negative for rash. Neurological: Positive for headache  All systems negative/normal/unremarkable except as stated in the HPI  ____________________________________________   PHYSICAL EXAM:  VITAL SIGNS: ED Triage Vitals  Enc Vitals Group     BP 03/14/19 1359 (!) 136/97     Pulse  Rate 03/14/19 1359 92     Resp 03/14/19 1359 (!) 22     Temp 03/14/19 1359 98.9 F (37.2 C)     Temp Source 03/14/19 1359 Oral     SpO2 03/14/19 1359 98 %     Weight 03/14/19 1355 269 lb (122 kg)     Height  03/14/19 1355 5\' 11"  (1.803 m)     Head Circumference --      Peak Flow --      Pain Score 03/14/19 1355 10     Pain Loc --      Pain Edu? --      Excl. in [place]? --    Constitutional: Alert and oriented. Well appearing and in no distress. Eyes: Conjunctivae are normal. Normal extraocular movements. ENT      Head: Normocephalic and atraumatic.      Nose: No congestion/rhinnorhea.      Mouth/Throat: Mucous membranes are moist.      Neck: No stridor. Cardiovascular: Normal rate, regular rhythm. No murmurs, rubs, or gallops. Respiratory: Normal respiratory effort without tachypnea nor retractions. Breath sounds are clear and equal bilaterally. No wheezes/rales/rhonchi. Gastrointestinal: Soft and nontender. Normal bowel sounds Musculoskeletal: Nontender with normal range of motion in extremities. No lower extremity tenderness nor edema. Neurologic:  Normal speech and language. No gross focal neurologic deficits are appreciated.  Skin:  Skin is warm, dry and intact. No rash noted. Psychiatric: Mood and affect are normal. Speech and behavior are normal.  ____________________________________________  EKG: Interpreted by me.  Sinus tachycardia rate of 88 bpm, bigeminy, normal axis, normal QT  ____________________________________________  ED COURSE:  As part of my medical decision making, I reviewed the following data within the [place] History obtained from family if available, nursing notes, old chart and ekg, as well as notes from prior ED visits. Patient presented for dyspnea, we will assess with labs and imaging as indicated at this time.   Procedures  Larry Callahan was evaluated in Emergency Department on 03/14/2019 for the symptoms described in the history of present illness. He was evaluated in the context of the global COVID-19 pandemic, which necessitated consideration that the patient might be at risk for infection with the SARS-CoV-2 virus that causes  COVID-19. Institutional protocols and algorithms that pertain to the evaluation of patients at risk for COVID-19 are in a state of rapid change based on information released by regulatory bodies including the CDC and federal and state organizations. These policies and algorithms were followed during the patient's care in the ED.  ____________________________________________   LABS (pertinent positives/negatives)  Labs Reviewed  CBC WITH DIFFERENTIAL/PLATELET  COMPREHENSIVE METABOLIC PANEL  BRAIN NATRIURETIC PEPTIDE  TROPONIN I (HIGH SENSITIVITY)    RADIOLOGY Images were viewed by me  Chest x-ray IMPRESSION:  No acute cardiopulmonary disease.  ____________________________________________   DIFFERENTIAL DIAGNOSIS   CHF, COPD, pneumonia, COVID-19, dehydration, electrolyte abnormality, hyperglycemia  FINAL ASSESSMENT AND PLAN  Dyspnea   Plan: The patient had presented for shortness of breath and concerns about his kidney function or dehydration.  Patient's labs are still pending at this time.   Laurence Aly, MD    Note: This note was generated in part or whole with voice recognition software. Voice recognition is usually quite accurate but there are transcription errors that can and very often do occur. I apologize for any typographical errors that were not detected and corrected.     Earleen Newport, MD 03/14/19 [phone]

## 2019-03-14 NOTE — ED Notes (Signed)
Administered CBG reading 116.AS

## 2019-03-14 NOTE — ED Notes (Signed)
Pt called out reporting he called his sister who was upset with pt being discharged. Pt requesting ED MD call his cardiac MD because Cardiac MD verbalized to pts family that he did not feel it was safe for pt to be discharged home.

## 2019-03-14 NOTE — ED Notes (Signed)
X-ray at bedside

## 2019-03-15 ENCOUNTER — Telehealth: Payer: Self-pay

## 2019-03-15 NOTE — Telephone Encounter (Signed)
ORDER SIGNED AND PLACED IN [employer] PATIENT FOLDER.

## 2019-03-15 NOTE — Progress Notes (Signed)
Mildly reduced lung capacity. Discuss with patient at visit 04/09/2019

## 2019-03-26 ENCOUNTER — Telehealth: Payer: Self-pay | Admitting: Cardiovascular Disease

## 2019-03-26 ENCOUNTER — Observation Stay: Payer: Medicare PPO

## 2019-03-26 ENCOUNTER — Inpatient Hospital Stay
Admission: EM | Admit: 2019-03-26 | Discharge: 2019-03-31 | DRG: 286 | Disposition: A | Discharge status: Home / Self Care | Payer: Medicare PPO | Attending: Internal Medicine | Admitting: Internal Medicine

## 2019-03-26 ENCOUNTER — Other Ambulatory Visit: Payer: Self-pay

## 2019-03-26 ENCOUNTER — Emergency Department: Payer: Medicare PPO

## 2019-03-26 DIAGNOSIS — R519 Headache, unspecified: Secondary | ICD-10-CM | POA: Diagnosis present

## 2019-03-26 DIAGNOSIS — R0789 Other chest pain: Secondary | ICD-10-CM | POA: Diagnosis present

## 2019-03-26 DIAGNOSIS — J449 Chronic obstructive pulmonary disease, unspecified: Secondary | ICD-10-CM | POA: Diagnosis present

## 2019-03-26 DIAGNOSIS — E782 Mixed hyperlipidemia: Secondary | ICD-10-CM | POA: Diagnosis present

## 2019-03-26 DIAGNOSIS — Z87891 Personal history of nicotine dependence: Secondary | ICD-10-CM | POA: Diagnosis not present

## 2019-03-26 DIAGNOSIS — Z8249 Family history of ischemic heart disease and other diseases of the circulatory system: Secondary | ICD-10-CM

## 2019-03-26 DIAGNOSIS — Z20828 Contact with and (suspected) exposure to other viral communicable diseases: Secondary | ICD-10-CM | POA: Diagnosis present

## 2019-03-26 DIAGNOSIS — Z833 Family history of diabetes mellitus: Secondary | ICD-10-CM | POA: Diagnosis not present

## 2019-03-26 DIAGNOSIS — N1831 Chronic kidney disease, stage 3a: Secondary | ICD-10-CM | POA: Diagnosis present

## 2019-03-26 DIAGNOSIS — Z85528 Personal history of other malignant neoplasm of kidney: Secondary | ICD-10-CM

## 2019-03-26 DIAGNOSIS — Z9111 Patient's noncompliance with dietary regimen: Secondary | ICD-10-CM | POA: Diagnosis not present

## 2019-03-26 DIAGNOSIS — Z880 Allergy status to penicillin: Secondary | ICD-10-CM | POA: Diagnosis not present

## 2019-03-26 DIAGNOSIS — I130 Hypertensive heart and chronic kidney disease with heart failure and stage 1 through stage 4 chronic kidney disease, or unspecified chronic kidney disease: Principal | ICD-10-CM | POA: Diagnosis present

## 2019-03-26 DIAGNOSIS — Z6837 Body mass index (BMI) 37.0-37.9, adult: Secondary | ICD-10-CM | POA: Diagnosis not present

## 2019-03-26 DIAGNOSIS — E1122 Type 2 diabetes mellitus with diabetic chronic kidney disease: Secondary | ICD-10-CM | POA: Diagnosis present

## 2019-03-26 DIAGNOSIS — Z801 Family history of malignant neoplasm of trachea, bronchus and lung: Secondary | ICD-10-CM

## 2019-03-26 DIAGNOSIS — E118 Type 2 diabetes mellitus with unspecified complications: Secondary | ICD-10-CM | POA: Diagnosis not present

## 2019-03-26 DIAGNOSIS — I5033 Acute on chronic diastolic (congestive) heart failure: Secondary | ICD-10-CM | POA: Diagnosis not present

## 2019-03-26 DIAGNOSIS — K219 Gastro-esophageal reflux disease without esophagitis: Secondary | ICD-10-CM | POA: Diagnosis present

## 2019-03-26 DIAGNOSIS — I5032 Chronic diastolic (congestive) heart failure: Secondary | ICD-10-CM | POA: Diagnosis not present

## 2019-03-26 DIAGNOSIS — R55 Syncope and collapse: Secondary | ICD-10-CM

## 2019-03-26 DIAGNOSIS — R079 Chest pain, unspecified: Secondary | ICD-10-CM

## 2019-03-26 DIAGNOSIS — Z79899 Other long term (current) drug therapy: Secondary | ICD-10-CM

## 2019-03-26 DIAGNOSIS — Z905 Acquired absence of kidney: Secondary | ICD-10-CM

## 2019-03-26 DIAGNOSIS — I2510 Atherosclerotic heart disease of native coronary artery without angina pectoris: Secondary | ICD-10-CM | POA: Diagnosis present

## 2019-03-26 DIAGNOSIS — R7989 Other specified abnormal findings of blood chemistry: Secondary | ICD-10-CM | POA: Diagnosis not present

## 2019-03-26 DIAGNOSIS — E876 Hypokalemia: Secondary | ICD-10-CM | POA: Diagnosis present

## 2019-03-26 DIAGNOSIS — R0600 Dyspnea, unspecified: Secondary | ICD-10-CM

## 2019-03-26 DIAGNOSIS — R0602 Shortness of breath: Secondary | ICD-10-CM | POA: Diagnosis present

## 2019-03-26 DIAGNOSIS — T501X6A Underdosing of loop [high-ceiling] diuretics, initial encounter: Secondary | ICD-10-CM | POA: Diagnosis present

## 2019-03-26 DIAGNOSIS — N1832 Chronic kidney disease, stage 3b: Secondary | ICD-10-CM | POA: Diagnosis not present

## 2019-03-26 DIAGNOSIS — Z7951 Long term (current) use of inhaled steroids: Secondary | ICD-10-CM

## 2019-03-26 DIAGNOSIS — I10 Essential (primary) hypertension: Secondary | ICD-10-CM | POA: Diagnosis not present

## 2019-03-26 DIAGNOSIS — I272 Pulmonary hypertension, unspecified: Secondary | ICD-10-CM | POA: Diagnosis present

## 2019-03-26 DIAGNOSIS — Z7982 Long term (current) use of aspirin: Secondary | ICD-10-CM

## 2019-03-26 DIAGNOSIS — D631 Anemia in chronic kidney disease: Secondary | ICD-10-CM | POA: Diagnosis present

## 2019-03-26 DIAGNOSIS — Z794 Long term (current) use of insulin: Secondary | ICD-10-CM

## 2019-03-26 DIAGNOSIS — I420 Dilated cardiomyopathy: Secondary | ICD-10-CM | POA: Diagnosis not present

## 2019-03-26 DIAGNOSIS — G4733 Obstructive sleep apnea (adult) (pediatric): Secondary | ICD-10-CM | POA: Diagnosis present

## 2019-03-26 DIAGNOSIS — Z91128 Patient's intentional underdosing of medication regimen for other reason: Secondary | ICD-10-CM

## 2019-03-26 HISTORY — DX: Unspecified diastolic (congestive) heart failure: I50.30

## 2019-03-26 LAB — BRAIN NATRIURETIC PEPTIDE: B Natriuretic Peptide: 184 pg/mL — ABNORMAL HIGH (ref 0.0–100.0)

## 2019-03-26 LAB — BASIC METABOLIC PANEL
Anion gap: 10 (ref 5–15)
BUN: 22 mg/dL (ref 8–23)
CO2: 20 mmol/L — ABNORMAL LOW (ref 22–32)
Calcium: 9.7 mg/dL (ref 8.9–10.3)
Chloride: 112 mmol/L — ABNORMAL HIGH (ref 98–111)
Creatinine, Ser: 1.69 mg/dL — ABNORMAL HIGH (ref 0.61–1.24)
GFR calc Af Amer: 48 mL/min — ABNORMAL LOW (ref >60)
GFR calc non Af Amer: 42 mL/min — ABNORMAL LOW (ref >60)
Glucose, Bld: 144 mg/dL — ABNORMAL HIGH (ref 70–99)
Potassium: 3.5 mmol/L (ref 3.5–5.1)
Sodium: 142 mmol/L (ref 135–145)

## 2019-03-26 LAB — CBC
HCT: 33.5 % — ABNORMAL LOW (ref 39.0–52.0)
Hemoglobin: 10.3 g/dL — ABNORMAL LOW (ref 13.0–17.0)
MCH: 25.6 pg — ABNORMAL LOW (ref 26.0–34.0)
MCHC: 30.7 g/dL (ref 30.0–36.0)
MCV: 83.1 fL (ref 80.0–100.0)
Platelets: 304 10*3/uL (ref 150–400)
RBC: 4.03 MIL/uL — ABNORMAL LOW (ref 4.22–5.81)
RDW: 16.7 % — ABNORMAL HIGH (ref 11.5–15.5)
WBC: 9.2 10*3/uL (ref 4.0–10.5)
nRBC: 0 % (ref 0.0–0.2)

## 2019-03-26 LAB — TROPONIN I (HIGH SENSITIVITY)
Troponin I (High Sensitivity): 17 ng/L (ref <18)
Troponin I (High Sensitivity): 17 ng/L (ref <18)

## 2019-03-26 LAB — GLUCOSE, CAPILLARY
Glucose-Capillary: 140 mg/dL — ABNORMAL HIGH (ref 70–99)
Glucose-Capillary: 190 mg/dL — ABNORMAL HIGH (ref 70–99)

## 2019-03-26 LAB — SARS CORONAVIRUS 2 (TAT 6-24 HRS): SARS Coronavirus 2: NEGATIVE

## 2019-03-26 MED ORDER — FERROUS GLUCONATE 324 (38 FE) MG PO TABS
324.0000 mg | ORAL_TABLET | Freq: Every day | ORAL | Status: DC
  Administered 2019-03-27 – 2019-03-31 (×4): 324 mg via ORAL
  Filled 2019-03-26 (×6): qty 1

## 2019-03-26 MED ORDER — HEPARIN SODIUM (PORCINE) 5000 UNIT/ML IJ SOLN
5000.0000 [IU] | Freq: Three times a day (TID) | INTRAMUSCULAR | Status: DC
  Administered 2019-03-26 – 2019-03-31 (×13): 5000 [IU] via SUBCUTANEOUS
  Filled 2019-03-26 (×13): qty 1

## 2019-03-26 MED ORDER — ALBUTEROL SULFATE (2.5 MG/3ML) 0.083% IN NEBU
2.5000 mg | INHALATION_SOLUTION | Freq: Once | RESPIRATORY_TRACT | Status: AC
  Administered 2019-03-26: 2.5 mg via RESPIRATORY_TRACT
  Filled 2019-03-26: qty 3

## 2019-03-26 MED ORDER — ONDANSETRON HCL 4 MG PO TABS: 4.0000 mg | ORAL_TABLET | Freq: Four times a day (QID) | ORAL | Status: DC | PRN

## 2019-03-26 MED ORDER — MORPHINE SULFATE (PF) 4 MG/ML IV SOLN
4.0000 mg | Freq: Once | INTRAVENOUS | Status: AC
  Administered 2019-03-26: 4 mg via INTRAVENOUS
  Filled 2019-03-26: qty 1

## 2019-03-26 MED ORDER — MOMETASONE FURO-FORMOTEROL FUM 200-5 MCG/ACT IN AERO
2.0000 | INHALATION_SPRAY | Freq: Two times a day (BID) | RESPIRATORY_TRACT | Status: DC
  Administered 2019-03-26 – 2019-03-31 (×9): 2 via RESPIRATORY_TRACT
  Filled 2019-03-26: qty 8.8

## 2019-03-26 MED ORDER — TECHNETIUM TO 99M ALBUMIN AGGREGATED
4.0000 | Freq: Once | INTRAVENOUS | Status: AC | PRN
  Administered 2019-03-26: 4.763 via INTRAVENOUS

## 2019-03-26 MED ORDER — ACETAMINOPHEN 325 MG PO TABS
650.0000 mg | ORAL_TABLET | ORAL | Status: DC | PRN
  Filled 2019-03-26: qty 2

## 2019-03-26 MED ORDER — ISOSORBIDE MONONITRATE ER 30 MG PO TB24
30.0000 mg | ORAL_TABLET | Freq: Every day | ORAL | Status: DC
  Administered 2019-03-27 – 2019-03-31 (×5): 30 mg via ORAL
  Filled 2019-03-26 (×5): qty 1

## 2019-03-26 MED ORDER — ASPIRIN EC 81 MG PO TBEC
81.0000 mg | DELAYED_RELEASE_TABLET | Freq: Every day | ORAL | Status: DC
  Administered 2019-03-27 – 2019-03-31 (×5): 81 mg via ORAL
  Filled 2019-03-26 (×5): qty 1

## 2019-03-26 MED ORDER — FUROSEMIDE 10 MG/ML IJ SOLN: 40.0000 mg | Freq: Every day | INTRAMUSCULAR | Status: DC

## 2019-03-26 MED ORDER — ONDANSETRON HCL 4 MG/2ML IJ SOLN: 4.0000 mg | Freq: Four times a day (QID) | INTRAMUSCULAR | Status: DC | PRN

## 2019-03-26 MED ORDER — ALBUTEROL SULFATE (2.5 MG/3ML) 0.083% IN NEBU
2.5000 mg | INHALATION_SOLUTION | Freq: Four times a day (QID) | RESPIRATORY_TRACT | Status: DC
  Administered 2019-03-26 – 2019-03-29 (×12): 2.5 mg via RESPIRATORY_TRACT
  Filled 2019-03-26 (×12): qty 3

## 2019-03-26 MED ORDER — SODIUM CHLORIDE 0.9% FLUSH: 3.0000 mL | Freq: Once | INTRAVENOUS | Status: DC

## 2019-03-26 MED ORDER — NICOTINE 21 MG/24HR TD PT24
21.0000 mg | MEDICATED_PATCH | Freq: Every day | TRANSDERMAL | Status: DC
  Administered 2019-03-27: 21 mg via TRANSDERMAL
  Filled 2019-03-26 (×5): qty 1

## 2019-03-26 MED ORDER — METOCLOPRAMIDE HCL 5 MG/ML IJ SOLN
10.0000 mg | Freq: Once | INTRAMUSCULAR | Status: AC
  Administered 2019-03-26: 10 mg via INTRAVENOUS
  Filled 2019-03-26: qty 2

## 2019-03-26 MED ORDER — ONDANSETRON HCL 4 MG/2ML IJ SOLN
4.0000 mg | Freq: Once | INTRAMUSCULAR | Status: AC
  Administered 2019-03-26: 4 mg via INTRAVENOUS
  Filled 2019-03-26: qty 2

## 2019-03-26 MED ORDER — INSULIN GLARGINE 100 UNIT/ML ~~LOC~~ SOLN
12.0000 [IU] | Freq: Every evening | SUBCUTANEOUS | Status: DC
  Administered 2019-03-26 – 2019-03-30 (×5): 12 [IU] via SUBCUTANEOUS
  Filled 2019-03-26 (×7): qty 0.12

## 2019-03-26 MED ORDER — INSULIN ASPART 100 UNIT/ML ~~LOC~~ SOLN
0.0000 [IU] | Freq: Every day | SUBCUTANEOUS | Status: DC
  Administered 2019-03-29: 3 [IU] via SUBCUTANEOUS
  Administered 2019-03-30: 2 [IU] via SUBCUTANEOUS
  Filled 2019-03-26 (×2): qty 1

## 2019-03-26 MED ORDER — CLOBETASOL PROPIONATE 0.05 % EX CREA
1.0000 "application " | TOPICAL_CREAM | Freq: Two times a day (BID) | CUTANEOUS | Status: DC | PRN
  Filled 2019-03-26: qty 15

## 2019-03-26 MED ORDER — PANTOPRAZOLE SODIUM 40 MG PO TBEC
40.0000 mg | DELAYED_RELEASE_TABLET | Freq: Every day | ORAL | Status: DC
  Administered 2019-03-27 – 2019-03-31 (×4): 40 mg via ORAL
  Filled 2019-03-26 (×4): qty 1

## 2019-03-26 MED ORDER — INSULIN ASPART 100 UNIT/ML ~~LOC~~ SOLN
0.0000 [IU] | Freq: Three times a day (TID) | SUBCUTANEOUS | Status: DC
  Administered 2019-03-26: 1 [IU] via SUBCUTANEOUS
  Administered 2019-03-27: 3 [IU] via SUBCUTANEOUS
  Administered 2019-03-27 – 2019-03-29 (×4): 2 [IU] via SUBCUTANEOUS
  Administered 2019-03-30: 1 [IU] via SUBCUTANEOUS
  Administered 2019-03-30: 5 [IU] via SUBCUTANEOUS
  Administered 2019-03-30: 2 [IU] via SUBCUTANEOUS
  Administered 2019-03-31 (×2): 7 [IU] via SUBCUTANEOUS
  Filled 2019-03-26 (×11): qty 1

## 2019-03-26 MED ORDER — AMLODIPINE BESYLATE 10 MG PO TABS
10.0000 mg | ORAL_TABLET | Freq: Every day | ORAL | Status: DC
  Administered 2019-03-27 – 2019-03-31 (×5): 10 mg via ORAL
  Filled 2019-03-26 (×5): qty 1

## 2019-03-26 MED ORDER — METOPROLOL SUCCINATE ER 50 MG PO TB24
50.0000 mg | ORAL_TABLET | Freq: Every day | ORAL | Status: DC
  Administered 2019-03-27 – 2019-03-30 (×4): 50 mg via ORAL
  Filled 2019-03-26 (×4): qty 1

## 2019-03-26 MED ORDER — HYDRALAZINE HCL 50 MG PO TABS
50.0000 mg | ORAL_TABLET | Freq: Three times a day (TID) | ORAL | Status: DC
  Administered 2019-03-26: 50 mg via ORAL
  Filled 2019-03-26 (×2): qty 1

## 2019-03-26 MED ORDER — GEMFIBROZIL 600 MG PO TABS
600.0000 mg | ORAL_TABLET | Freq: Two times a day (BID) | ORAL | Status: DC
  Administered 2019-03-26 – 2019-03-31 (×8): 600 mg via ORAL
  Filled 2019-03-26 (×12): qty 1

## 2019-03-26 MED ORDER — TECHNETIUM TC 99M DIETHYLENETRIAME-PENTAACETIC ACID
30.0000 | Freq: Once | INTRAVENOUS | Status: AC | PRN
  Administered 2019-03-26: 29.714 via INTRAVENOUS

## 2019-03-26 NOTE — H&P (Signed)
[facility] [place] NAME: Larry Callahan    MR#:  [id number]  DATE OF BIRTH:  27-Apr-1956  DATE OF ADMISSION:  03/26/2019  PRIMARY CARE PHYSICIAN: [name], NP   REQUESTING/REFERRING PHYSICIAN: Dr Cherylann Banas  CHIEF COMPLAINT:   Chief Complaint  Patient presents with  . Shortness of Breath  . Chest Pain    HISTORY OF PRESENT ILLNESS:  Larry Callahan  is a 63 y.o. male with a known history of CHF presents with shortness of breath and chest pain.  He was recently in the hospital and he felt okay after leaving the hospital.  He has been having shortness of breath for about 4 days.  Patient states that he has been having chest pain today.  Center of his chest.  Goes away pretty easily.  Feels short of breath now.  He is a former smoker.  He states his weight has been going up and down.  He states the swelling of his legs have been better than the last time he was in the hospital.  Hospitalist services were contacted for further evaluation.  Last hospitalization he did have a stress test that was negative.  PAST MEDICAL HISTORY:   Past Medical History:  Diagnosis Date  . Allergy   . Coronary artery disease   . Diabetes mellitus without complication ([place])   . Diastolic CHF ([place])   . GERD (gastroesophageal reflux disease)   . Hyperlipidemia   . Hypertension   . Sleep apnea     PAST SURGICAL HISTORY:   Past Surgical History:  Procedure Laterality Date  . BACK SURGERY    . CHOLECYSTECTOMY    . left arm surgery    . nephrectomy Left   . PARATHYROIDECTOMY      SOCIAL HISTORY:   Social History   Tobacco Use  . Smoking status: Former Research scientist (life sciences)  . Smokeless tobacco: Former Systems developer    Types: Chew  Substance Use Topics  . Alcohol use: No    Frequency: Never    FAMILY HISTORY:   Family History  Problem Relation Age of Onset  . Diabetes Mother   . Lung cancer Father   . Diabetes Sister   . Hypertension Sister   .  Heart disease Sister   . Cancer Sister   . Bone cancer Brother     DRUG ALLERGIES:   Allergies  Allergen Reactions  . Penicillins     REVIEW OF SYSTEMS:  CONSTITUTIONAL: No fever.  Positive for sweats and chills.  Weight has gone up and down.  Some fatigue and weakness.  EYES: No blurred or double vision.  EARS, NOSE, AND THROAT: No tinnitus or ear pain. No sore throat RESPIRATORY: No cough.positive for shortness of breath.  No wheezing or hemoptysis.  CARDIOVASCULAR: Positive for chest pain.  No edema.  GASTROINTESTINAL: No nausea, vomiting, diarrhea or abdominal pain. No blood in bowel movements GENITOURINARY: No dysuria, hematuria.  ENDOCRINE: No polyuria, nocturia,  HEMATOLOGY: No anemia, easy bruising or bleeding SKIN: No rash or lesion. MUSCULOSKELETAL: No joint pain or arthritis.   NEUROLOGIC: No tingling, numbness, weakness.  PSYCHIATRY: No anxiety or depression.   MEDICATIONS AT HOME:   Prior to Admission medications   Medication Sig Start Date End Date Taking? Authorizing Provider  acetaminophen (TYLENOL) 325 MG tablet Take 2 tablets (650 mg total) by mouth every 4 (four) hours as needed for headache or mild pain. 03/05/19   Nicholes Mango, MD  albuterol (VENTOLIN HFA)  108 (90 Base) MCG/ACT inhaler Inhale 2 puffs into the lungs every 6 (six) hours as needed for wheezing or shortness of breath. 03/05/19   Nicholes Mango, MD  Alcohol Swabs PADS 1 each. 04/04/18   [provider]  amLODipine (NORVASC) 10 MG tablet Take 1 tablet (10 mg total) by mouth daily. 03/12/19   [name], MD  aspirin EC 81 MG tablet Take 81 mg by mouth daily.    [provider]  Blood Glucose Calibration (TRUE METRIX LEVEL 1) Low SOLN Use as directed 04/04/18   [name], NP  Blood Glucose Monitoring Suppl (TRUE METRIX AIR GLUCOSE METER) DEVI 1 Device by Does not apply route 2 (two) times daily. 04/13/18   [name], NP  clobetasol cream (TEMOVATE) 1.61 % Apply 1  application topically 2 (two) times daily. 12/31/18   [name], NP  ferrous gluconate (FERGON) 324 MG tablet Take 1 tablet (324 mg total) by mouth daily with breakfast. 12/31/18   [name], NP  Fluticasone-Salmeterol (ADVAIR DISKUS) 250-50 MCG/DOSE AEPB Inhale 1 puff into the lungs 2 (two) times daily. 03/05/19 03/04/20  Nicholes Mango, MD  furosemide (LASIX) 20 MG tablet Take 20 mg (1 tab) by mouth once a day for weight 269 lb or less.  Take 40 mg (2 tabs) by mouth once a day for weight 270 lb or higher. 03/12/19   [name], MD  gemfibrozil (LOPID) 600 MG tablet Take 1 tablet (600 mg total) by mouth 2 (two) times daily with a meal. 02/28/19   [name], NP  glimepiride (AMARYL) 2 MG tablet Take 1 tablet (2 mg total) by mouth daily before breakfast. 03/13/19   [name], NP  glucose blood (TRUE METRIX BLOOD GLUCOSE TEST) test strip Use as instructed twice daily diag E11.65 11/19/18   [name], NP  hydrALAZINE (APRESOLINE) 50 MG tablet Take 1 tablet (50 mg total) by mouth 3 (three) times daily. 03/12/19   [name], MD  Insulin Glargine, 1 Unit Dial, (TOUJEO SOLOSTAR) 300 UNIT/ML SOPN Inject 26 Units into the skin every evening. 03/13/19 06/11/19  [name], NP  ipratropium-albuterol (DUONEB) 0.5-2.5 (3) MG/3ML SOLN Take 3 mLs by nebulization every 6 (six) hours as needed. 03/08/19   Kendell Bane, NP  isosorbide mononitrate (IMDUR) 30 MG 24 hr tablet Take 1 tablet (30 mg total) by mouth daily. 03/12/19   [name], MD  metoprolol succinate (TOPROL-XL) 50 MG 24 hr tablet Take 1 tablet (50 mg total) by mouth at bedtime. Take with or immediately following a meal. 03/12/19   Gollan, Kathlene November, MD  nicotine (NICODERM CQ - DOSED IN MG/24 HOURS) 21 mg/24hr patch Place 1 patch (21 mg total) onto the skin daily. 12/31/18   [name], NP  NOVOFINE 32G X 6 MM MISC To use with [place] injections daily 12/13/18   [name], NP  omeprazole  (PRILOSEC) 40 MG capsule Take 1 capsule (40 mg total) by mouth daily. 03/01/19   Kendell Bane, NP  triamcinolone ointment (KENALOG) 0.1 % Apply 1 application topically 2 (two) times daily. 11/23/18   [name], NP  TRUEPLUS LANCETS 33G MISC Use as directed twice daily diag E11.65 04/04/18   [name], NP      VITAL SIGNS:  Blood pressure (!) 161/87, pulse 95, temperature 98.8 F (37.1 C), temperature source Oral, resp. rate 19, height 5\' 11"  (1.803 m), weight 122  kg, SpO2 97 %.  PHYSICAL EXAMINATION:  GENERAL:  63 y.o.-year-old patient lying in the bed with no acute distress.  EYES: Pupils equal, round, reactive to light and accommodation. No scleral icterus. Extraocular muscles intact.  HEENT: Head atraumatic, normocephalic. Oropharynx and nasopharynx clear.  NECK:  Supple, no jugular venous distention. No thyroid enlargement, no tenderness.  LUNGS: Decreased breath sounds bilaterally, no wheezing, rales,rhonchi or crepitation. No use of accessory muscles of respiration.  CARDIOVASCULAR: S1, S2 normal. No murmurs, rubs, or gallops.  ABDOMEN: Soft, nontender, nondistended. Bowel sounds present. No organomegaly or mass.  EXTREMITIES: No pedal edema, cyanosis, or clubbing.  NEUROLOGIC: Cranial nerves II through XII are intact. Muscle strength 5/5 in all extremities. Sensation intact. Gait not checked.  PSYCHIATRIC: The patient is alert and oriented x 3.  SKIN: No rash, lesion, or ulcer.   LABORATORY PANEL:   CBC Recent Labs  Lab 03/26/19 0958  WBC 9.2  HGB 10.3*  HCT 33.5*  PLT 304   ------------------------------------------------------------------------------------------------------------------  Chemistries  Recent Labs  Lab 03/26/19 0958  NA 142  K 3.5  CL 112*  CO2 20*  GLUCOSE 144*  BUN 22  CREATININE 1.69*  CALCIUM 9.7   ------------------------------------------------------------------------------------------------------------------  Cardiac  Enzymes High-sensitivity troponin negative at 172  RADIOLOGY:  Dg Chest 2 View  Result Date: 03/26/2019 CLINICAL DATA:  Shortness of breath, chest pain EXAM: CHEST - 2 VIEW COMPARISON:  03/14/2019 FINDINGS: Heart is upper limits normal in size. Lungs clear. No effusions or edema. No acute bony abnormality. IMPRESSION: No active cardiopulmonary disease. Electronically Signed   By: Rolm Baptise M.D.   On: 03/26/2019 10:45   Ct Head Wo Contrast  Result Date: 03/26/2019 CLINICAL DATA:  Headache for several days. EXAM: CT HEAD WITHOUT CONTRAST TECHNIQUE: Contiguous axial images were obtained from the base of the skull through the vertex without intravenous contrast. COMPARISON:  CT head 10/23/2011 FINDINGS: Brain: No evidence of acute infarction, hemorrhage, hydrocephalus, extra-axial collection or mass lesion/mass effect. Vascular: No hyperdense vessel or unexpected calcification. Skull: Normal. Negative for fracture or focal lesion. Sinuses/Orbits: No acute finding. Other: None. IMPRESSION: No acute intracranial process. Electronically Signed   By: Audie Pinto M.D.   On: 03/26/2019 14:13    EKG:   Sinus rhythm 84 bpm, PVCs, flattening of T waves laterally.  IMPRESSION AND PLAN:   1.  Chest pain and shortness of breath.  The patient had a negative stress test last admission.  I will get a VQ scan this time.  Wondering if there could be a component of anxiety.  Cardiac enzymes negative.  Will get cardiology to see just in case they want to do a cardiac catheterization. 2.  Chronic diastolic congestive heart failure I will give IV Lasix while here.  Continue other medications.  Patient only has 1 kidney and has chronic kidney disease. 3.  COPD.  Continue inhalers.  Hold off on steroids. 4.  Type 2 diabetes mellitus continue long-acting insulin and put on sliding scale. 5.  Chronic kidney disease stage III.  Patient has 1 kidney and chronic kidney disease.  All the records are reviewed and  case discussed with ED provider. Management plans discussed with the patient, and he is in agreement.  CODE STATUS: Full code  TOTAL TIME TAKING CARE OF THIS PATIENT: 50 minutes.    Loletha Grayer M.D on 03/26/2019 at 3:41 PM  Between 7am to 6pm - Pager - [phone]  After 6pm call admission pager [place]  [phone]  CC: Primary care physician; [name], NP

## 2019-03-26 NOTE — ED Notes (Signed)
Pt given water okayed by MD. Pt updated on POC by MD. Pt denies any further needs at this time. Will continue to monitor.

## 2019-03-26 NOTE — Telephone Encounter (Signed)
Patient sister tearful on phone and calling to let us know patient is going to call EMS for chest pain and wants to see dr. Rockey Situ there.

## 2019-03-26 NOTE — ED Notes (Signed)
This RN explained and apologized to pt that there may be a wait on food tray. Pt given graham crackers and water. Pt denies any further needs. Will continue to monitor.

## 2019-03-26 NOTE — ED Notes (Addendum)
This RN gave pt water. Pt being transported to Nuclear Medicine for [facility] Pulmonary Perf and Vent Scan.

## 2019-03-26 NOTE — ED Notes (Addendum)
Patient transported to X-ray 

## 2019-03-26 NOTE — ED Triage Notes (Signed)
Pt primary c/o of CP. Per EMS arrival pt stated no CP but was having SOB. Upon arrival pt O2 is 98% on RA. Pt states he has CP 2/10. Pt states he stopped taking his Lasix 2 days ago due to peeing too much.

## 2019-03-26 NOTE — ED Notes (Signed)
ED Provider at bedside. 

## 2019-03-26 NOTE — ED Notes (Signed)
Inhalers held at this time due to pt resting. Will give with night time meds.

## 2019-03-26 NOTE — ED Notes (Signed)
EDP at bedside at this time. Pt c/o continuing to feel bad at this time. Pt states "I just don't know if I can go home feeling like this, I just don't feel good". Pt requesting to spend the night "for just a night" to see if he will feel better at this time.

## 2019-03-26 NOTE — ED Notes (Signed)
Cardiology at bedside at this time. NAD noted at this time. Will continue to monitor for further patient needs.

## 2019-03-26 NOTE — ED Notes (Signed)
This RN gave pt meal tray. Pt started breathing treatment per MD order. Pt given phone to call sister.  Pt denies any further needs at this time. Will continue to monitor.

## 2019-03-26 NOTE — ED Notes (Signed)
PT given phone to talk to sister.

## 2019-03-26 NOTE — ED Notes (Signed)
MD made aware that pt still feels like he can't catch his breath. States after administration of medication he felt like he was shaking all over. Repeat troponin drawn and sent. Will continue to monitor. VSS.

## 2019-03-26 NOTE — ED Notes (Signed)
This RN introduced self to pt. Pt states he stopped taking his Lasix 2 days ago due to peeing too much. Pt c/o SOB and CP. Pt oxygen saturation is 99% on RA. Pt states CP is 2/10. Pt denies any needs at this time.

## 2019-03-26 NOTE — ED Provider Notes (Signed)
[facility] Emergency Department Provider Note ____________________________________________   First MD Initiated Contact with Patient 03/26/19 [phone]     (approximate)  I have reviewed the triage vital signs and the nursing notes.   HISTORY  Chief Complaint Shortness of Breath and Chest Pain    HPI Larry Callahan is a 63 y.o. male with PMH as noted below including CAD and CHF on Lasix who presents with worsening shortness of breath over the last several days.  The patient states it is worse with exertion.  He reports associated chest pain earlier although this has now improved.  The patient states that he did not take his Lasix for the last few days because he was urinating too frequently.  He denies any cough or fever.  He states he has minimal leg swelling at this time.  Past Medical History:  Diagnosis Date   Allergy    Coronary artery disease    Diabetes mellitus without complication ([place])    GERD (gastroesophageal reflux disease)    Hyperlipidemia    Hypertension    Sleep apnea     Patient Active Problem List   Diagnosis Date Noted   Uncontrolled type 2 diabetes mellitus with insulin therapy ([place]) 03/12/2019   Acute on chronic heart failure with preserved ejection fraction (HFpEF) ([place])    Acute kidney injury superimposed on chronic kidney disease ([place])    CHF (congestive heart failure) ([place]) 21/22/4825   Diastolic dysfunction 00/37/0488   Iron deficiency anemia 12/31/2018   Atopic dermatitis 11/24/2018   Screening for colon cancer 11/06/2017   Uncontrolled type 2 diabetes mellitus with hyperglycemia ([place]) 11/06/2017   Dermatitis, unspecified 06/09/2017   Atherosclerotic heart disease of native coronary artery without angina pectoris 06/09/2017   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   Obstructive sleep apnea, adult 06/09/2017   Morbid (severe) obesity due to excess calories ([place])  06/09/2017   Cigarette nicotine dependence with nicotine-induced disorder 06/09/2017   Type 2 diabetes mellitus with diabetic polyneuropathy ([place]) 06/09/2017   Allergic rhinitis due to pollen 06/09/2017   Mixed hyperlipidemia 06/09/2017   Chronic obstructive pulmonary disease, unspecified ([place]) 06/09/2017   Dyspnea on exertion 06/09/2017   Wheezing 06/09/2017   Dysuria 06/09/2017   Snoring 06/09/2017   Essential hypertension 06/09/2017   Personal history of other malignant neoplasm of kidney 06/09/2017   Pain in right hip 06/09/2017   Impacted cerumen, bilateral 06/09/2017   Secondary hyperparathyroidism, not elsewhere classified ([place]) 06/09/2017   Type 2 diabetes mellitus with hyperglycemia ([place]) 06/09/2017   Tinea corporis 06/09/2017    Past Surgical History:  Procedure Laterality Date   BACK SURGERY     CHOLECYSTECTOMY     left arm surgery     nephrectomy Left    PARATHYROIDECTOMY      Prior to Admission medications   Medication Sig Start Date End Date Taking? Authorizing Provider  acetaminophen (TYLENOL) 325 MG tablet Take 2 tablets (650 mg total) by mouth every 4 (four) hours as needed for headache or mild pain. 03/05/19   Gouru, Illene Silver, MD  albuterol (VENTOLIN HFA) 108 (90 Base) MCG/ACT inhaler Inhale 2 puffs into the lungs every 6 (six) hours as needed for wheezing or shortness of breath. 03/05/19   Nicholes Mango, MD  Alcohol Swabs PADS 1 each. 04/04/18   [provider]  amLODipine (NORVASC) 10 MG tablet Take 1 tablet (10 mg total) by mouth daily. 03/12/19   [name], MD  aspirin EC  81 MG tablet Take 81 mg by mouth daily.    [provider]  Blood Glucose Calibration (TRUE METRIX LEVEL 1) Low SOLN Use as directed 04/04/18   [name], NP  Blood Glucose Monitoring Suppl (TRUE METRIX AIR GLUCOSE METER) DEVI 1 Device by Does not apply route 2 (two) times daily. 04/13/18   [name], NP  clobetasol cream (TEMOVATE)  0.93 % Apply 1 application topically 2 (two) times daily. 12/31/18   [name], NP  ferrous gluconate (FERGON) 324 MG tablet Take 1 tablet (324 mg total) by mouth daily with breakfast. 12/31/18   [name], NP  Fluticasone-Salmeterol (ADVAIR DISKUS) 250-50 MCG/DOSE AEPB Inhale 1 puff into the lungs 2 (two) times daily. 03/05/19 03/04/20  Nicholes Mango, MD  furosemide (LASIX) 20 MG tablet Take 20 mg (1 tab) by mouth once a day for weight 269 lb or less.  Take 40 mg (2 tabs) by mouth once a day for weight 270 lb or higher. 03/12/19   [name], MD  gemfibrozil (LOPID) 600 MG tablet Take 1 tablet (600 mg total) by mouth 2 (two) times daily with a meal. 02/28/19   [name], NP  glimepiride (AMARYL) 2 MG tablet Take 1 tablet (2 mg total) by mouth daily before breakfast. 03/13/19   [name], NP  glucose blood (TRUE METRIX BLOOD GLUCOSE TEST) test strip Use as instructed twice daily diag E11.65 11/19/18   [name], NP  hydrALAZINE (APRESOLINE) 50 MG tablet Take 1 tablet (50 mg total) by mouth 3 (three) times daily. 03/12/19   [name], MD  Insulin Glargine, 1 Unit Dial, (TOUJEO SOLOSTAR) 300 UNIT/ML SOPN Inject 26 Units into the skin every evening. 03/13/19 06/11/19  [name], NP  ipratropium-albuterol (DUONEB) 0.5-2.5 (3) MG/3ML SOLN Take 3 mLs by nebulization every 6 (six) hours as needed. 03/08/19   Kendell Bane, NP  isosorbide mononitrate (IMDUR) 30 MG 24 hr tablet Take 1 tablet (30 mg total) by mouth daily. 03/12/19   [name], MD  metoprolol succinate (TOPROL-XL) 50 MG 24 hr tablet Take 1 tablet (50 mg total) by mouth at bedtime. Take with or immediately following a meal. 03/12/19   Gollan, Kathlene November, MD  nicotine (NICODERM CQ - DOSED IN MG/24 HOURS) 21 mg/24hr patch Place 1 patch (21 mg total) onto the skin daily. 12/31/18   [name], NP  NOVOFINE 32G X 6 MM MISC To use with [place] injections daily 12/13/18   [name], NP    omeprazole (PRILOSEC) 40 MG capsule Take 1 capsule (40 mg total) by mouth daily. 03/01/19   Kendell Bane, NP  triamcinolone ointment (KENALOG) 0.1 % Apply 1 application topically 2 (two) times daily. 11/23/18   [name], NP  TRUEPLUS LANCETS 33G MISC Use as directed twice daily diag E11.65 04/04/18   [name], NP    Allergies Penicillins  Family History  Problem Relation Age of Onset   Diabetes Mother    Lung cancer Father    Diabetes Sister    Hypertension Sister    Heart disease Sister    Cancer Sister    Bone cancer Brother     Social History Social History   Tobacco Use   Smoking status: Former Smoker   Smokeless tobacco: Former Systems developer    Types: Chew  Substance Use Topics   Alcohol use: No    Frequency: Never   Drug  use: No    Review of Systems  Constitutional: No fever. Eyes: No redness. ENT: No sore throat. Cardiovascular: Positive for chest pain. Respiratory: Positive for shortness of breath. Gastrointestinal: No vomiting or diarrhea.  Genitourinary: Negative for flank pain.  Musculoskeletal: Negative for back pain. Skin: Negative for rash. Neurological: Negative for headache.   ____________________________________________   PHYSICAL EXAM:  VITAL SIGNS: ED Triage Vitals  Enc Vitals Group     BP 03/26/19 1010 137/80     Pulse Rate 03/26/19 0956 97     Resp 03/26/19 0956 18     Temp 03/26/19 1010 98.8 F (37.1 C)     Temp Source 03/26/19 1010 Oral     SpO2 03/26/19 0955 98 %     Weight 03/26/19 0957 269 lb (122 kg)     Height 03/26/19 0957 5\' 11"  (1.803 m)     Head Circumference --      Peak Flow --      Pain Score 03/26/19 0957 2     Pain Loc --      Pain Edu? --      Excl. in [place]? --     Constitutional: Alert and oriented.  Slightly uncomfortable appearing but in no acute distress. Eyes: Conjunctivae are normal.  Head: Atraumatic. Nose: No congestion/rhinnorhea. Mouth/Throat: Mucous membranes are moist.    Neck: Normal range of motion.  Cardiovascular: Normal rate, regular rhythm. Grossly normal heart sounds.  Good peripheral circulation. Respiratory: Somewhat increased respiratory effort.  No retractions. Lungs with faint rales to bilateral bases. Gastrointestinal: Soft and nontender. No distention.  Genitourinary: No flank tenderness. Musculoskeletal: Trace bilateral lower extremity edema.  Extremities warm and well perfused.  Neurologic:  Normal speech and language. No gross focal neurologic deficits are appreciated.  Skin:  Skin is warm and dry. No rash noted. Psychiatric: Mood and affect are normal. Speech and behavior are normal.  ____________________________________________   LABS (all labs ordered are listed, but only abnormal results are displayed)  Labs Reviewed  BASIC METABOLIC PANEL - Abnormal; Notable for the following components:      Result Value   Chloride 112 (*)    CO2 20 (*)    Glucose, Bld 144 (*)    Creatinine, Ser 1.69 (*)    GFR calc non Af Amer 42 (*)    GFR calc Af Amer 48 (*)    All other components within normal limits  CBC - Abnormal; Notable for the following components:   RBC 4.03 (*)    Hemoglobin 10.3 (*)    HCT 33.5 (*)    MCH 25.6 (*)    RDW 16.7 (*)    All other components within normal limits  BRAIN NATRIURETIC PEPTIDE - Abnormal; Notable for the following components:   B Natriuretic Peptide 184.0 (*)    All other components within normal limits  SARS CORONAVIRUS 2 (TAT 6-24 HRS)  TROPONIN I (HIGH SENSITIVITY)  TROPONIN I (HIGH SENSITIVITY)   ____________________________________________  EKG  ED ECG REPORT I, Arta Silence, the attending physician, personally viewed and interpreted this ECG.  Date: 03/26/2019 EKG Time: 0956 Rate: 84 Rhythm: normal sinus rhythm with PVCs QRS Axis: normal Intervals: normal ST/T Wave abnormalities: Nonspecific T wave abnormalities Narrative Interpretation: no evidence of acute  ischemia  ____________________________________________  RADIOLOGY  CXR: No focal infiltrate, vascular congestion, or other acute abnormality CT head: No ICH or other acute findings  ____________________________________________   PROCEDURES  Procedure(s) performed: No  Procedures  Critical Care performed: No  ____________________________________________   INITIAL IMPRESSION / ASSESSMENT AND PLAN / ED COURSE  Pertinent labs & imaging results that were available during my care of the patient were reviewed by me and considered in my medical decision making (see chart for details).  63 year old male with PMH as noted above including diastolic CHF presents with worsening shortness of breath and chest pain over the last 1 to 2 days.  He states he had stopped taking his Lasix a few days ago because he was urinating too frequently.  I reviewed the past medical records in [place].  The patient was most recently admitted last month with CHF, presenting with shortness of breath and chest pain.  The patient was also seen in the ED on 03/14/2019 with a similar presentation but had reassuring vital signs at that time and was discharged home.  On exam today the patient is slightly uncomfortable and anxious appearing with mildly increased work of breathing but no acute respiratory distress.  O2 saturation is in the high 90s on room air.  The physical exam is otherwise as described above.  The patient has some faint rales and decreased breath sounds but no significant peripheral edema.  Overall presentation is most consistent with CHF.  I have a lower suspicion for ACS on this presentation given his reassuring EKG.  We will obtain lab work-up including cardiac enzymes, chest x-ray, and reassess.  ----------------------------------------- 3:12 PM on 03/26/2019 -----------------------------------------  The work-up has been unremarkable.  The patient's creatinine is improving from recent labs, and his  troponins are negative x2.  BNP is also low.  The patient reported recurrent severe chest pain at some point.  When this improved, he also developed a headache.  I talked to the patient's sister over the phone who mentioned that the patient has been having recurrent night sweats and relatively severe headaches, and they were concerned for possible stroke or hemorrhage.  Although the neuro exam is normal, I obtained a CT which was negative.  Although the ED work-up is relatively unremarkable, given the patient's continued chest pain, dyspnea on exertion, and recurrent sweats and near syncopal type episodes, I will admit for observation.  I discussed the case with Dr. Earleen Newport from the hospital service.  _______________________  Bettye Boeck Louison was evaluated in Emergency Department on 03/26/2019 for the symptoms described in the history of present illness. He was evaluated in the context of the global COVID-19 pandemic, which necessitated consideration that the patient might be at risk for infection with the SARS-CoV-2 virus that causes COVID-19. Institutional protocols and algorithms that pertain to the evaluation of patients at risk for COVID-19 are in a state of rapid change based on information released by regulatory bodies including the CDC and federal and state organizations. These policies and algorithms were followed during the patient's care in the ED. ____________________________________________   FINAL CLINICAL IMPRESSION(S) / ED DIAGNOSES  Final diagnoses:  Dyspnea, unspecified type  Chest pain, unspecified type  Near syncope      NEW MEDICATIONS STARTED DURING THIS VISIT:  New Prescriptions   No medications on file     Note:  This document was prepared using Dragon voice recognition software and may include unintentional dictation errors.    Arta Silence, MD 03/26/19 1513

## 2019-03-26 NOTE — Telephone Encounter (Signed)
Will forward to Dr. Rockey Situ to advise him that the patient is currently in the ER and will be admitted for observation.

## 2019-03-26 NOTE — ED Notes (Signed)
Pharmacy contacted again regarding medications for patient. Will check CBG prior to patient eating and administer insulin when patient's meal tray arrives.

## 2019-03-26 NOTE — Consult Note (Signed)
Cardiology Consultation:   Patient ID: Larry Callahan; [id number]; 04-23-1956   Admit date: 03/26/2019 Date of Consult: 03/26/2019  Primary Care Provider: [name], NP Primary Cardiologist: Rockey Situ   Patient Profile:   Larry Callahan is a 63 y.o. male with a hx of nonobstructive CAD by [facility] in 2011, HFpEF, pulmonary hypertension, prolonged tobacco abuse smoking from age 73 through 63 quitting in 08/2018, insulin-dependent diabetes, CKD stage II with solitary kidney, poorly controlled hypertension, anemia, obesity, OSA with reported compliance with CPAP, possible OHS, and noncompliance who is being seen today for the evaluation of dyspnea at the request of Dr. Leslye Peer.  History of Present Illness:   Mr. Coble was seen in the ED in 12/2018 with shortness of breath with echo being performed at that time though unable to be reviewed.  Work-up was unrevealing with recommendation for the patient to follow-up with cardiology as an outpatient.  Patient did not follow-up at that time and was seen again in the ED in early 02/2019 again for shortness of breath for the past 3 months that has reportedly been constant.  CTA chest was negative for PE with noted aortic atherosclerosis and coronary artery calcifications.  Chest x-ray showed mild cardiomegaly with vascular congestion.  Troponin was negative.  Patient was given IV Lasix and discharged home.  During each ED visit BP was suboptimally controlled in the 150s over 90s.  He was subsequently admitted to the hospital in mid 02/2019 with acute on chronic diastolic CHF.  Echo showed an EF of 55 to 60%, mild LVH, diastolic dysfunction, normal RV systolic function and ventricular cavity size, severely dilated left atrium, trivial tricuspid regurgitation, mild aortic valve sclerosis without stenosis.  Lexiscan Myoview showed no significant ischemia and was read as high risk secondary to LVEF read at less than 30% though there was noted  significant reduced tracer uptake likely affecting his EF.  Echo during the same admission 1 day later showed preserved LV systolic function as outlined above.  With diuresis symptoms improved.  He was seen in our office for hospital follow-up on 03/12/2019 and reported he was doing well overall.  Weight documented at 121 kg.  Patient reported a home weight ranging between 216 267 pounds.  BP remained elevated in the 829 systolic.  He was noted to have cut back on his fluid intake.  He was advised to take Lasix 20 mg daily and for weight greater than 270 pounds take 40 mg of Lasix.  He was seen in the ED 2 days later with return of shortness of breath with work-up being relatively unrevealing including normal high-sensitivity troponin, chest x-ray without acute process, nonacute EKG, BNP of 102 which was improved from 3 weeks prior of 322, improved hemoglobin of 10.5 from prior of 9.3.  Patient was advised to follow-up as an outpatient.  Patient returned to the ED today with continued shortness of breath, substernal chest discomfort occurring at rest, intermittent left occipital headache occurring at night with associated night sweats and multiple complaints regarding his "sugar pills."  Patient reports she has continued to have shortness of breath which dates back to early summer 2020 with the above work-up being unrevealing to date.  Earlier in the day today he did have some substernal chest discomfort that was not associated with exertion and was without radiation.  There was no increase in shortness of breath associated with this chest pain.  There has been some lower extremity swelling.  He indicates  an uptrending weight though is unable to provide weights on his home scale.  He has not taken his Lasix for the past 2 days as he states it was causing him to "pee too much."  It appears his main concern is related to his diabetic medication.  Since his arrival in the ED vitals have been stable.  Work-up has been  relatively unrevealing with negative high-sensitivity troponin x2, BNP 184, stable though low hemoglobin of 10.3, potassium 3.5, serum creatinine at approximate baseline of 1.69.  Chest x-ray with no acute process.  Given his headache he underwent CT head which was nonacute.  EKG is not available for review.  He is scheduled to undergo VQ scan.  Since arriving in the ED he reports resolution of his lower extremity swelling.  He continues to report shortness of breath with conversation.  Currently chest pain-free.  Past Medical History:  Diagnosis Date   Allergy    Coronary artery disease    Diabetes mellitus without complication (HCC)    Diastolic CHF ([place])    GERD (gastroesophageal reflux disease)    Hyperlipidemia    Hypertension    Sleep apnea     Past Surgical History:  Procedure Laterality Date   BACK SURGERY     CHOLECYSTECTOMY     left arm surgery     nephrectomy Left    PARATHYROIDECTOMY       Home Meds: Prior to Admission medications   Medication Sig Start Date End Date Taking? Authorizing Provider  acetaminophen (TYLENOL) 325 MG tablet Take 2 tablets (650 mg total) by mouth every 4 (four) hours as needed for headache or mild pain. 03/05/19   Gouru, Illene Silver, MD  albuterol (VENTOLIN HFA) 108 (90 Base) MCG/ACT inhaler Inhale 2 puffs into the lungs every 6 (six) hours as needed for wheezing or shortness of breath. 03/05/19   Nicholes Mango, MD  Alcohol Swabs PADS 1 each. 04/04/18   [provider]  amLODipine (NORVASC) 10 MG tablet Take 1 tablet (10 mg total) by mouth daily. 03/12/19   [name], MD  aspirin EC 81 MG tablet Take 81 mg by mouth daily.    [provider]  Blood Glucose Calibration (TRUE METRIX LEVEL 1) Low SOLN Use as directed 04/04/18   [name], NP  Blood Glucose Monitoring Suppl (TRUE METRIX AIR GLUCOSE METER) DEVI 1 Device by Does not apply route 2 (two) times daily. 04/13/18   [name], NP  clobetasol cream  (TEMOVATE) 2.59 % Apply 1 application topically 2 (two) times daily. 12/31/18   [name], NP  ferrous gluconate (FERGON) 324 MG tablet Take 1 tablet (324 mg total) by mouth daily with breakfast. 12/31/18   [name], NP  Fluticasone-Salmeterol (ADVAIR DISKUS) 250-50 MCG/DOSE AEPB Inhale 1 puff into the lungs 2 (two) times daily. 03/05/19 03/04/20  Nicholes Mango, MD  furosemide (LASIX) 20 MG tablet Take 20 mg (1 tab) by mouth once a day for weight 269 lb or less.  Take 40 mg (2 tabs) by mouth once a day for weight 270 lb or higher. 03/12/19   [name], MD  gemfibrozil (LOPID) 600 MG tablet Take 1 tablet (600 mg total) by mouth 2 (two) times daily with a meal. 02/28/19   Boscia, Greer Ee, NP  glimepiride (AMARYL) 2 MG tablet Take 1 tablet (2 mg total) by mouth daily before breakfast. 03/13/19   [name], NP  glucose blood (TRUE METRIX BLOOD GLUCOSE TEST)  test strip Use as instructed twice daily diag E11.65 11/19/18   [name], NP  hydrALAZINE (APRESOLINE) 50 MG tablet Take 1 tablet (50 mg total) by mouth 3 (three) times daily. 03/12/19   [name], MD  Insulin Glargine, 1 Unit Dial, (TOUJEO SOLOSTAR) 300 UNIT/ML SOPN Inject 26 Units into the skin every evening. 03/13/19 06/11/19  [name], NP  ipratropium-albuterol (DUONEB) 0.5-2.5 (3) MG/3ML SOLN Take 3 mLs by nebulization every 6 (six) hours as needed. 03/08/19   Kendell Bane, NP  isosorbide mononitrate (IMDUR) 30 MG 24 hr tablet Take 1 tablet (30 mg total) by mouth daily. 03/12/19   [name], MD  metoprolol succinate (TOPROL-XL) 50 MG 24 hr tablet Take 1 tablet (50 mg total) by mouth at bedtime. Take with or immediately following a meal. 03/12/19   Gollan, Kathlene November, MD  nicotine (NICODERM CQ - DOSED IN MG/24 HOURS) 21 mg/24hr patch Place 1 patch (21 mg total) onto the skin daily. 12/31/18   [name], NP  NOVOFINE 32G X 6 MM MISC To use with [place] injections daily 12/13/18   [name], NP  omeprazole (PRILOSEC) 40 MG capsule Take 1 capsule (40 mg total) by mouth daily. 03/01/19   Kendell Bane, NP  triamcinolone ointment (KENALOG) 0.1 % Apply 1 application topically 2 (two) times daily. 11/23/18   [name], NP  TRUEPLUS LANCETS 33G MISC Use as directed twice daily diag E11.65 04/04/18   [name], NP    Inpatient Medications: Scheduled Meds:  albuterol  2 puff Inhalation Q6H   [START ON 03/27/2019] amLODipine  10 mg Oral Daily   [START ON 03/27/2019] aspirin EC  81 mg Oral Daily   clobetasol cream  1 application Topical BID   [START ON 03/27/2019] ferrous gluconate  324 mg Oral Q breakfast   furosemide  40 mg Intravenous Daily   gemfibrozil  600 mg Oral BID WC   heparin  5,000 Units Subcutaneous Q8H   hydrALAZINE  50 mg Oral TID   insulin aspart  0-5 Units Subcutaneous QHS   insulin aspart  0-9 Units Subcutaneous TID WC   Insulin Glargine (1 Unit Dial)  12 Units Subcutaneous QPM   [START ON 03/27/2019] isosorbide mononitrate  30 mg Oral Daily   metoprolol succinate  50 mg Oral QHS   mometasone-formoterol  2 puff Inhalation BID   nicotine  21 mg Transdermal Daily   [START ON 03/27/2019] pantoprazole  40 mg Oral Daily   Continuous Infusions:  PRN Meds: acetaminophen, ondansetron **OR** ondansetron (ZOFRAN) IV  Allergies:   Allergies  Allergen Reactions   Penicillins     Social History:   Social History   Socioeconomic History   Marital status: Single    Spouse name: Not on file   Number of children: Not on file   Years of education: Not on file   Highest education level: Not on file  Occupational History   Not on file  Social Needs   Financial resource strain: Not on file   Food insecurity    Worry: Not on file    Inability: Not on file   Transportation needs    Medical: Not on file    Non-medical: Not on file  Tobacco Use   Smoking status: Former Smoker   Smokeless tobacco: Former Systems developer     Types: Chew  Substance and Sexual Activity   Alcohol use: No    Frequency: Never  Drug use: No   Sexual activity: Not on file  Lifestyle   Physical activity    Days per week: Not on file    Minutes per session: Not on file   Stress: Not on file  Relationships   Social connections    Talks on phone: Not on file    Gets together: Not on file    Attends religious service: Not on file    Active member of club or organization: Not on file    Attends meetings of clubs or organizations: Not on file    Relationship status: Not on file   Intimate partner violence    Fear of current or ex partner: Not on file    Emotionally abused: Not on file    Physically abused: Not on file    Forced sexual activity: Not on file  Other Topics Concern   Not on file  Social History Narrative   Not on file     Family History:   Family History  Problem Relation Age of Onset   Diabetes Mother    Lung cancer Father    Diabetes Sister    Hypertension Sister    Heart disease Sister    Cancer Sister    Bone cancer Brother     ROS:  Review of Systems  Constitutional: Positive for malaise/fatigue. Negative for chills, diaphoresis, fever and weight loss.       Night sweats  HENT: Negative for congestion.   Eyes: Negative for discharge and redness.  Respiratory: Negative for cough, hemoptysis, sputum production, shortness of breath and wheezing.   Cardiovascular: Positive for chest pain. Negative for palpitations, orthopnea, claudication, leg swelling and PND.  Gastrointestinal: Negative for abdominal pain, blood in stool, heartburn, melena, nausea and vomiting.  Genitourinary: Positive for frequency. Negative for hematuria.  Musculoskeletal: Negative for falls and myalgias.  Skin: Negative for rash.  Neurological: Positive for weakness and headaches. Negative for dizziness, tingling, tremors, sensory change, speech change, focal weakness and loss of consciousness.    Endo/Heme/Allergies: Does not bruise/bleed easily.  Psychiatric/Behavioral: Negative for substance abuse. The patient is not nervous/anxious.   All other systems reviewed and are negative.     Physical Exam/Data:   Vitals:   03/26/19 1330 03/26/19 1400 03/26/19 1500 03/26/19 1530  BP: 139/70 (!) 161/87 (!) 154/70 129/68  Pulse: 80 95 76 81  Resp: 15 19 12 14   Temp:      TempSrc:      SpO2: 98% 97% 95% 97%  Weight:      Height:       No intake or output data in the 24 hours ending 03/26/19 1605 Filed Weights   03/26/19 0957  Weight: 122 kg   Body mass index is 37.52 kg/m.   Physical Exam: General: Well developed, well nourished, in no acute distress. Head: Normocephalic, atraumatic, sclera non-icteric, no xanthomas, nares without discharge.  Neck: Negative for carotid bruits. JVD not elevated. Lungs: Clear bilaterally to auscultation without wheezes, rales, or rhonchi. Breathing is unlabored. Heart: RRR with S1 S2. No murmurs, rubs, or gallops appreciated. Abdomen: Obese, soft, non-tender, non-distended with normoactive bowel sounds. No hepatomegaly. No rebound/guarding. No obvious abdominal masses. Msk:  Strength and tone appear normal for age. Extremities: No clubbing or cyanosis. No edema. Distal pedal pulses are 2+ and equal bilaterally. Neuro: Alert and oriented X 3. No facial asymmetry. No focal deficit. Moves all extremities spontaneously. Psych:  Responds to questions appropriately with a normal affect.   EKG:  The EKG was personally reviewed and demonstrates: Not in epic for review Telemetry:  Telemetry was personally reviewed and demonstrates: Sinus rhythm with occasional PVCs  Weights: Filed Weights   03/26/19 0957  Weight: 122 kg    Relevant CV Studies: 2D echo 02/2019: 1. Left ventricular ejection fraction, by visual estimation, is 55 to 60%. The left ventricle has normal function. Normal left ventricular size. Left ventricular septal wall thickness was  mildly increased. Mildly increased left ventricular posterior wall thickness. There is mildly increased left ventricular hypertrophy.  2. Elevated left ventricular end-diastolic pressure.  3. Left ventricular diastolic Doppler parameters are consistent with pseudonormalization pattern of LV diastolic filling.  4. Global right ventricle has normal systolic function.The right ventricular size is normal. No increase in right ventricular wall thickness.  5. Left atrial size was severely dilated.  6. Right atrial size was normal.  7. The mitral valve is normal in structure. Trace mitral valve regurgitation. No evidence of mitral stenosis.  8. The tricuspid valve is normal in structure. Tricuspid valve regurgitation is trivial.  9. The aortic valve is normal in structure. Aortic valve regurgitation was not visualized by color flow Doppler. Mild aortic valve sclerosis without stenosis. 10. The pulmonic valve was normal in structure. Pulmonic valve regurgitation is trivial by color flow Doppler. 11. Moderately elevated pulmonary artery systolic pressure. 12. The inferior vena cava is dilated in size with >50% respiratory variability, suggesting right atrial pressure of 8 mmHg. __________  Carlton Adam Myoview 02/2019:  This is a high risk study due to reduced systolic function. There is no ischemia.  There was no ST segment deviation noted during stress.  The left ventricular ejection fraction is severely decreased (<30%). However the veracity of these findings is unclear given poor tracer uptake.  Resting images are poor due to reduced tracer uptake.  Recommend echo for systolic function assessment.   Laboratory Data:  Chemistry Recent Labs  Lab 03/26/19 0958  NA 142  K 3.5  CL 112*  CO2 20*  GLUCOSE 144*  BUN 22  CREATININE 1.69*  CALCIUM 9.7  GFRNONAA 42*  GFRAA 48*  ANIONGAP 10    No results for input(s): PROT, ALBUMIN, AST, ALT, ALKPHOS, BILITOT in the last 168  hours. Hematology Recent Labs  Lab 03/26/19 0958  WBC 9.2  RBC 4.03*  HGB 10.3*  HCT 33.5*  MCV 83.1  MCH 25.6*  MCHC 30.7  RDW 16.7*  PLT 304   Cardiac EnzymesNo results for input(s): TROPONINI in the last 168 hours. No results for input(s): TROPIPOC in the last 168 hours.  BNP Recent Labs  Lab 03/26/19 0958  BNP 184.0*    DDimer No results for input(s): DDIMER in the last 168 hours.  Radiology/Studies:  Dg Chest 2 View  Result Date: 03/26/2019 IMPRESSION: No active cardiopulmonary disease. Electronically Signed   By: Rolm Baptise M.D.   On: 03/26/2019 10:45   Ct Head Wo Contrast  Result Date: 03/26/2019 IMPRESSION: No acute intracranial process. Electronically Signed   By: Audie Pinto M.D.   On: 03/26/2019 14:13    Assessment and Plan:   1.  Dyspnea: -Uncertain etiology, though likely multifactorial including underlying HFpEF, pulmonary hypertension, possible component of undiagnosed pulmonary disease given prolonged tobacco abuse, morbid obesity, sleep apnea with possible OHS, anemia, and noncompliance -Patient does not appear to be grossly volume overloaded on exam or with objective data including labs and imaging -Upon reaching the floor recommend ReDs vest -Patient is for VQ scan later today -  If work-up is unrevealing and symptoms persist patient ultimately may need right and left cardiac cath -He would benefit from outpatient pulmonology eval given extensive tobacco abuse history smoking since age 48 and quitting in 08/2018 -Cannot rule out potential noncompliance with diet, CPAP, and medications -Would not aggressively diurese  2.  HFpEF/pulmonary pretension: -Appears well compensated and euvolemic -Recent echo as above -Patient indicates he has been noncompliant with Lasix over the past 2 days stating he was "peeing too much" -Continue PTA Lasix as directed by primary cardiologist  3.  Chest pain with moderate risk for cardiac etiology: -Currently  symptom-free -Recent Myoview without significant ischemia and initially read as high risk though this appears to have been only secondary to reduced LV systolic function which appears to have been falsely low in the setting of poor images with reduced tracer uptake with subsequent echo demonstrating preserved LV systolic function -If symptoms persist may need diagnostic cath as outlined above -VQ scan as above  4.  Headache/night sweats: -CT head nonacute -Of uncertain etiology -Per IM  5.  Insulin-dependent diabetes: -This appears to be the patient's main focus on exam in the ED this afternoon -Consider diabetes consult -Per IM  6.  Morbid obesity with sleep apnea: -Loss advised -Compliance with CPAP recommended  7.  CKD stage II with solitary kidney: -Stable -Would not aggressively diurese  8.  Anemia: -Uncertain etiology -Likely playing a role in his above presentation -Further work-up per IM    For questions or updates, please contact [place] Please consult [email or web address] for contact info under Cardiology/STEMI.   Signed, Christell Faith, PA-C [facility] Pager: [phone] 03/26/2019, 4:05 PM

## 2019-03-27 DIAGNOSIS — Z6837 Body mass index (BMI) 37.0-37.9, adult: Secondary | ICD-10-CM | POA: Diagnosis not present

## 2019-03-27 DIAGNOSIS — I130 Hypertensive heart and chronic kidney disease with heart failure and stage 1 through stage 4 chronic kidney disease, or unspecified chronic kidney disease: Secondary | ICD-10-CM | POA: Diagnosis present

## 2019-03-27 DIAGNOSIS — R0602 Shortness of breath: Secondary | ICD-10-CM | POA: Diagnosis present

## 2019-03-27 DIAGNOSIS — R0789 Other chest pain: Secondary | ICD-10-CM | POA: Diagnosis present

## 2019-03-27 DIAGNOSIS — Z9111 Patient's noncompliance with dietary regimen: Secondary | ICD-10-CM | POA: Diagnosis not present

## 2019-03-27 DIAGNOSIS — E782 Mixed hyperlipidemia: Secondary | ICD-10-CM | POA: Diagnosis present

## 2019-03-27 DIAGNOSIS — I272 Pulmonary hypertension, unspecified: Secondary | ICD-10-CM | POA: Diagnosis present

## 2019-03-27 DIAGNOSIS — Z87891 Personal history of nicotine dependence: Secondary | ICD-10-CM | POA: Diagnosis not present

## 2019-03-27 DIAGNOSIS — I5033 Acute on chronic diastolic (congestive) heart failure: Secondary | ICD-10-CM | POA: Diagnosis not present

## 2019-03-27 DIAGNOSIS — Z833 Family history of diabetes mellitus: Secondary | ICD-10-CM | POA: Diagnosis not present

## 2019-03-27 DIAGNOSIS — R55 Syncope and collapse: Secondary | ICD-10-CM | POA: Diagnosis present

## 2019-03-27 DIAGNOSIS — R519 Headache, unspecified: Secondary | ICD-10-CM | POA: Diagnosis present

## 2019-03-27 DIAGNOSIS — R0600 Dyspnea, unspecified: Secondary | ICD-10-CM | POA: Diagnosis not present

## 2019-03-27 DIAGNOSIS — E876 Hypokalemia: Secondary | ICD-10-CM | POA: Diagnosis present

## 2019-03-27 DIAGNOSIS — Z905 Acquired absence of kidney: Secondary | ICD-10-CM | POA: Diagnosis not present

## 2019-03-27 DIAGNOSIS — J449 Chronic obstructive pulmonary disease, unspecified: Secondary | ICD-10-CM | POA: Diagnosis present

## 2019-03-27 DIAGNOSIS — I2510 Atherosclerotic heart disease of native coronary artery without angina pectoris: Secondary | ICD-10-CM | POA: Diagnosis present

## 2019-03-27 DIAGNOSIS — D631 Anemia in chronic kidney disease: Secondary | ICD-10-CM | POA: Diagnosis present

## 2019-03-27 DIAGNOSIS — G4733 Obstructive sleep apnea (adult) (pediatric): Secondary | ICD-10-CM | POA: Diagnosis present

## 2019-03-27 DIAGNOSIS — N1831 Chronic kidney disease, stage 3a: Secondary | ICD-10-CM | POA: Diagnosis present

## 2019-03-27 DIAGNOSIS — R079 Chest pain, unspecified: Secondary | ICD-10-CM | POA: Diagnosis not present

## 2019-03-27 DIAGNOSIS — Z20828 Contact with and (suspected) exposure to other viral communicable diseases: Secondary | ICD-10-CM | POA: Diagnosis present

## 2019-03-27 DIAGNOSIS — N1832 Chronic kidney disease, stage 3b: Secondary | ICD-10-CM | POA: Diagnosis not present

## 2019-03-27 DIAGNOSIS — Z880 Allergy status to penicillin: Secondary | ICD-10-CM | POA: Diagnosis not present

## 2019-03-27 DIAGNOSIS — Z85528 Personal history of other malignant neoplasm of kidney: Secondary | ICD-10-CM | POA: Diagnosis not present

## 2019-03-27 DIAGNOSIS — K219 Gastro-esophageal reflux disease without esophagitis: Secondary | ICD-10-CM | POA: Diagnosis present

## 2019-03-27 DIAGNOSIS — I5032 Chronic diastolic (congestive) heart failure: Secondary | ICD-10-CM | POA: Diagnosis not present

## 2019-03-27 DIAGNOSIS — E1122 Type 2 diabetes mellitus with diabetic chronic kidney disease: Secondary | ICD-10-CM | POA: Diagnosis present

## 2019-03-27 DIAGNOSIS — I420 Dilated cardiomyopathy: Secondary | ICD-10-CM | POA: Diagnosis not present

## 2019-03-27 DIAGNOSIS — Z79899 Other long term (current) drug therapy: Secondary | ICD-10-CM | POA: Diagnosis not present

## 2019-03-27 LAB — CBC
HCT: 31.7 % — ABNORMAL LOW (ref 39.0–52.0)
Hemoglobin: 9.6 g/dL — ABNORMAL LOW (ref 13.0–17.0)
MCH: 25.3 pg — ABNORMAL LOW (ref 26.0–34.0)
MCHC: 30.3 g/dL (ref 30.0–36.0)
MCV: 83.4 fL (ref 80.0–100.0)
Platelets: 269 10*3/uL (ref 150–400)
RBC: 3.8 MIL/uL — ABNORMAL LOW (ref 4.22–5.81)
RDW: 16.8 % — ABNORMAL HIGH (ref 11.5–15.5)
WBC: 8.3 10*3/uL (ref 4.0–10.5)
nRBC: 0 % (ref 0.0–0.2)

## 2019-03-27 LAB — BASIC METABOLIC PANEL
Anion gap: 13 (ref 5–15)
BUN: 23 mg/dL (ref 8–23)
CO2: 17 mmol/L — ABNORMAL LOW (ref 22–32)
Calcium: 9 mg/dL (ref 8.9–10.3)
Chloride: 111 mmol/L (ref 98–111)
Creatinine, Ser: 1.65 mg/dL — ABNORMAL HIGH (ref 0.61–1.24)
GFR calc Af Amer: 50 mL/min — ABNORMAL LOW (ref >60)
GFR calc non Af Amer: 43 mL/min — ABNORMAL LOW (ref >60)
Glucose, Bld: 127 mg/dL — ABNORMAL HIGH (ref 70–99)
Potassium: 3.3 mmol/L — ABNORMAL LOW (ref 3.5–5.1)
Sodium: 141 mmol/L (ref 135–145)

## 2019-03-27 LAB — GLUCOSE, CAPILLARY
Glucose-Capillary: 90 mg/dL (ref 70–99)
Glucose-Capillary: 213 mg/dL — ABNORMAL HIGH (ref 70–99)
Glucose-Capillary: 200 mg/dL — ABNORMAL HIGH (ref 70–99)
Glucose-Capillary: 141 mg/dL — ABNORMAL HIGH (ref 70–99)

## 2019-03-27 MED ORDER — SODIUM CHLORIDE 0.9 % IV SOLN: 250.0000 mL | INTRAVENOUS | Status: DC | PRN

## 2019-03-27 MED ORDER — IPRATROPIUM-ALBUTEROL 0.5-2.5 (3) MG/3ML IN SOLN: 3.0000 mL | Freq: Four times a day (QID) | RESPIRATORY_TRACT | Status: DC | PRN

## 2019-03-27 MED ORDER — HYDRALAZINE HCL 50 MG PO TABS: 100.0000 mg | ORAL_TABLET | Freq: Three times a day (TID) | ORAL | Status: DC

## 2019-03-27 MED ORDER — POTASSIUM CHLORIDE CRYS ER 20 MEQ PO TBCR
40.0000 meq | EXTENDED_RELEASE_TABLET | Freq: Once | ORAL | Status: AC
  Administered 2019-03-27: 40 meq via ORAL
  Filled 2019-03-27: qty 2

## 2019-03-27 MED ORDER — SODIUM CHLORIDE 0.9% FLUSH: 3.0000 mL | INTRAVENOUS | Status: DC | PRN

## 2019-03-27 MED ORDER — SODIUM CHLORIDE 0.9% FLUSH
3.0000 mL | Freq: Two times a day (BID) | INTRAVENOUS | Status: DC
  Administered 2019-03-27 (×2): 3 mL via INTRAVENOUS

## 2019-03-27 MED ORDER — SODIUM CHLORIDE 0.9 % IV SOLN
INTRAVENOUS | Status: DC
  Administered 2019-03-28: 09:00:00 via INTRAVENOUS

## 2019-03-27 MED ORDER — HYDRALAZINE HCL 50 MG PO TABS
100.0000 mg | ORAL_TABLET | Freq: Three times a day (TID) | ORAL | Status: DC
  Administered 2019-03-27 – 2019-03-31 (×11): 100 mg via ORAL
  Filled 2019-03-27 (×12): qty 2

## 2019-03-27 MED ORDER — LORATADINE 10 MG PO TABS
10.0000 mg | ORAL_TABLET | Freq: Every day | ORAL | Status: DC
  Administered 2019-03-27 – 2019-03-31 (×4): 10 mg via ORAL
  Filled 2019-03-27 (×4): qty 1

## 2019-03-27 NOTE — Progress Notes (Signed)
[facility] [place] NAME: Larry Callahan    MR#:  [id number]  DATE OF BIRTH:  1955/12/12  SUBJECTIVE:   Patient states he is feeling a little better today.  He continues to be short of breath.  He denies any chest pain this morning.  No other concerns.  REVIEW OF SYSTEMS:  Review of Systems  Constitutional: Negative for chills and fever.  HENT: Negative for congestion and sore throat.   Eyes: Negative for blurred vision and double vision.  Respiratory: Positive for shortness of breath. Negative for cough.   Cardiovascular: Negative for chest pain, palpitations and leg swelling.  Gastrointestinal: Negative for nausea and vomiting.  Genitourinary: Negative for dysuria and urgency.  Musculoskeletal: Negative for back pain and neck pain.  Neurological: Negative for dizziness and headaches.  Psychiatric/Behavioral: Negative for depression. The patient is not nervous/anxious.     DRUG ALLERGIES:   Allergies  Allergen Reactions  . Penicillins    VITALS:  Blood pressure (!) 165/80, pulse 78, temperature 97.6 F (36.4 C), temperature source Oral, resp. rate 18, height 5\' 11"  (1.803 m), weight 120.8 kg, SpO2 97 %. PHYSICAL EXAMINATION:  Physical Exam  GENERAL:   Sitting up on the edge of the bed, eating breakfast.  In no acute distress. HEENT: Head atraumatic, normocephalic. Pupils equal, round, reactive to light and accommodation. No scleral icterus. Extraocular muscles intact. Oropharynx and nasopharynx clear.  NECK:  Supple, no jugular venous distention. No thyroid enlargement. LUNGS:  Diminished breath sounds in the lung bases bilaterally. No wheezes, crackles, rhonchi. No use of accessory muscles of respiration.  CARDIOVASCULAR: RRR, S1, S2 normal. No murmurs, rubs, or gallops.  ABDOMEN: Soft, nontender, nondistended. Bowel sounds present.  EXTREMITIES: No pedal edema, cyanosis, or clubbing.  NEUROLOGIC: CN 2-12 intact, no focal  deficits. 5/5 muscle strength throughout all extremities. Sensation intact throughout. Gait not checked.  PSYCHIATRIC: The patient is alert and oriented x 3.  SKIN: No obvious rash, lesion, or ulcer.  LABORATORY PANEL:  Male CBC Recent Labs  Lab 03/27/19 0443  WBC 8.3  HGB 9.6*  HCT 31.7*  PLT 269   ------------------------------------------------------------------------------------------------------------------ Chemistries  Recent Labs  Lab 03/27/19 0443  NA 141  K 3.3*  CL 111  CO2 17*  GLUCOSE 127*  BUN 23  CREATININE 1.65*  CALCIUM 9.0   RADIOLOGY:  Nm Pulmonary Perf And Vent  Result Date: 03/26/2019 CLINICAL DATA:  Chest pain with positive D-dimer study EXAM: NUCLEAR MEDICINE VENTILATION - PERFUSION LUNG SCAN VIEWS: Anterior, posterior, left lateral, right lateral, RPO, LPO, RAO, LAO-ventilation and perfusion RADIOPHARMACEUTICALS:  29.714 mCi of Tc-6m DTPA aerosol inhalation and 4.763 mCi Tc76m MAA IV COMPARISON:  Chest radiograph March 26, 2019 FINDINGS: Ventilation: Radiotracer uptake bilaterally is homogeneous and symmetric bilaterally. No ventilation defects are appreciable. Perfusion: Radiotracer uptake is homogeneous and symmetric bilaterally. No perfusion defects are appreciable. IMPRESSION: No appreciable ventilation or perfusion defects. Very low probability of pulmonary embolus. Electronically Signed   By: Lowella Grip III M.D.   On: 03/26/2019 17:32   ASSESSMENT AND PLAN:   1.  Chest pain and shortness of breath- shortness of breath is likely multifactorial due to OSA and COPD. Patient had a recent negative stress test and had a negative VQ scan yesterday.   Troponin has been negative x 2.   Cardiology planning for right heart cath tomorrow morning.  Will make patient n.p.o. at midnight.  Would benefit from seeing pulmonology as an outpatient.  2.  Chronic diastolic congestive heart failure- will hold IV Lasix today in anticipation of cath tomorrow. 3.   COPD- no signs of acute exacerbation. Continue inhalers and duonebs prn. 4.  Type 2 diabetes mellitus- continue long-acting insulin and put on sliding scale. 5.  Chronic kidney disease stage III- patient has 1 kidney.  Creatinine is at baseline. 6.  Hypokalemia- replete and recheck 7.  Anemia of chronic kidney disease- hemoglobin is at baseline.  Monitor.  All the records are reviewed and case discussed with Care Management/Social Worker. Management plans discussed with the patient, family and they are in agreement.  CODE STATUS: Full Code  TOTAL TIME TAKING CARE OF THIS PATIENT: 38 minutes.   More than 50% of the time was spent in counseling/coordination of care: YES  POSSIBLE D/C IN 1-2 DAYS, DEPENDING ON CLINICAL CONDITION.   Berna Spare Mahealani Sulak M.D on 03/27/2019 at 2:41 PM  Between 7am to 6pm - Pager - [phone]  After 6pm go to [email or web address] - Proofreader  [facility]  Office  [phone]  CC: Primary care physician; [name], NP  Note: This dictation was prepared with Dragon dictation along with smaller phrase technology. Any transcriptional errors that result from this process are unintentional.

## 2019-03-27 NOTE — Progress Notes (Signed)
Pts red vest reads 40%. I will continue to assess.

## 2019-03-27 NOTE — ED Notes (Signed)
ED TO INPATIENT HANDOFF REPORT  ED Nurse Name and Phone #:  Shira Bobst [phone]  S Name/Age/Gender Larry Callahan 63 y.o. male Room/Bed: ED18A/ED18A  Code Status   Code Status: Full Code  Home/SNF/Other Home Patient oriented to: self, place, time and situation Is this baseline? Yes   Triage Complete: Triage complete  Chief Complaint sob ems  Triage Note Pt primary c/o of CP. Per EMS arrival pt stated no CP but was having SOB. Upon arrival pt O2 is 98% on RA. Pt states he has CP 2/10. Pt states he stopped taking his Lasix 2 days ago due to peeing too much.    Allergies Allergies  Allergen Reactions  . Penicillins     Level of Care/Admitting Diagnosis ED Disposition    ED Disposition Condition [facility] Area: [place] [[id number]]  Level of Care: Telemetry [5]  Covid Evaluation: Asymptomatic Screening Protocol (No Symptoms)  Diagnosis: Shortness of breath [786.05.ICD-9-CM]  Admitting Physician: Loletha Grayer [[id number]]  Attending Physician: Loletha Grayer [phone]  PT Class (Do Not Modify): Observation [104]  PT Acc Code (Do Not Modify): Observation [10022]       B Medical/Surgery History Past Medical History:  Diagnosis Date  . Allergy   . Coronary artery disease   . Diabetes mellitus without complication ([place])   . Diastolic CHF ([place])   . GERD (gastroesophageal reflux disease)   . Hyperlipidemia   . Hypertension   . Sleep apnea    Past Surgical History:  Procedure Laterality Date  . BACK SURGERY    . CHOLECYSTECTOMY    . left arm surgery    . nephrectomy Left   . PARATHYROIDECTOMY       A IV Location/Drains/Wounds Patient Lines/Drains/Airways Status   Active Line/Drains/Airways    Name:   Placement date:   Placement time:   Site:   Days:   Peripheral IV 03/26/19 Left Antecubital   03/26/19    1001    Antecubital   1          Intake/Output Last 24 hours No intake or output data in the 24 hours ending  03/27/19 0034  Labs/Imaging Results for orders placed or performed during the hospital encounter of 03/26/19 (from the past 48 hour(s))  Basic metabolic panel     Status: Abnormal   Collection Time: 03/26/19  9:58 AM  Result Value Ref Range   Sodium 142 135 - 145 mmol/L   Potassium 3.5 3.5 - 5.1 mmol/L   Chloride 112 (H) 98 - 111 mmol/L   CO2 20 (L) 22 - 32 mmol/L   Glucose, Bld 144 (H) 70 - 99 mg/dL   BUN 22 8 - 23 mg/dL   Creatinine, Ser 1.69 (H) 0.61 - 1.24 mg/dL   Calcium 9.7 8.9 - 10.3 mg/dL   GFR calc non Af Amer 42 (L) >60 mL/min   GFR calc Af Amer 48 (L) >60 mL/min   Anion gap 10 5 - 15    Comment: Performed at [facility], [address] [place], [place] [address]  CBC     Status: Abnormal   Collection Time: 03/26/19  9:58 AM  Result Value Ref Range   WBC 9.2 4.0 - 10.5 K/uL   RBC 4.03 (L) 4.22 - 5.81 MIL/uL   Hemoglobin 10.3 (L) 13.0 - 17.0 g/dL   HCT 33.5 (L) 39.0 - 52.0 %   MCV 83.1 80.0 - 100.0 fL   MCH 25.6 (L) 26.0 -  34.0 pg   MCHC 30.7 30.0 - 36.0 g/dL   RDW 16.7 (H) 11.5 - 15.5 %   Platelets 304 150 - 400 K/uL   nRBC 0.0 0.0 - 0.2 %    Comment: Performed at [facility], [place], [place] [address]  Troponin I (High Sensitivity)     Status: None   Collection Time: 03/26/19  9:58 AM  Result Value Ref Range   Troponin I (High Sensitivity) 17 <18 ng/L    Comment: (NOTE) Elevated high sensitivity troponin I (hsTnI) values and significant  changes across serial measurements may suggest ACS but many other  chronic and acute conditions are known to elevate hsTnI results.  Refer to the "Links" section for chest pain algorithms and additional  guidance. Performed at [facility], [address] [place], [place] [address]   Brain natriuretic peptide     Status: Abnormal   Collection Time: 03/26/19  9:58 AM  Result Value Ref Range   B Natriuretic Peptide 184.0 (H) 0.0 - 100.0 pg/mL    Comment: Performed at  [facility], [place], [place] [address]  Troponin I (High Sensitivity)     Status: None   Collection Time: 03/26/19 12:19 PM  Result Value Ref Range   Troponin I (High Sensitivity) 17 <18 ng/L    Comment: (NOTE) Elevated high sensitivity troponin I (hsTnI) values and significant  changes across serial measurements may suggest ACS but many other  chronic and acute conditions are known to elevate hsTnI results.  Refer to the "Links" section for chest pain algorithms and additional  guidance. Performed at [facility], [place], [place] [address]   SARS CORONAVIRUS 2 (TAT 6-24 HRS) Nasopharyngeal Nasopharyngeal Swab     Status: None   Collection Time: 03/26/19  1:48 PM   Specimen: Nasopharyngeal Swab  Result Value Ref Range   SARS Coronavirus 2 NEGATIVE NEGATIVE    Comment: (NOTE) SARS-CoV-2 target nucleic acids are NOT DETECTED. The SARS-CoV-2 RNA is generally detectable in upper and lower respiratory specimens during the acute phase of infection. Negative results do not preclude SARS-CoV-2 infection, do not rule out co-infections with other pathogens, and should not be used as the sole basis for treatment or other patient management decisions. Negative results must be combined with clinical observations, patient history, and epidemiological information. The expected result is Negative. Fact Sheet for Patients: SugarRoll.be Fact Sheet for Healthcare Providers: https://www.woods-mathews.com/ This test is not yet approved or cleared by the [place] FDA and  has been authorized for detection and/or diagnosis of SARS-CoV-2 by FDA under an Emergency Use Authorization (EUA). This EUA will remain  in effect (meaning this test can be used) for the duration of the COVID-19 declaration under Section 56 4(b)(1) of the Act, 21 U.S.C. section 360bbb-3(b)(1), unless the authorization is terminated  or revoked sooner. Performed at [facility], [place] [address], [place],  [address]   Glucose, capillary     Status: Abnormal   Collection Time: 03/26/19  6:40 PM  Result Value Ref Range   Glucose-Capillary 140 (H) 70 - 99 mg/dL  Glucose, capillary     Status: Abnormal   Collection Time: 03/26/19 10:10 PM  Result Value Ref Range   Glucose-Capillary 190 (H) 70 - 99 mg/dL   Dg Chest 2 View  Result Date: 03/26/2019 CLINICAL DATA:  Shortness of breath, chest pain EXAM: CHEST - 2 VIEW COMPARISON:  03/14/2019 FINDINGS: Heart is  upper limits normal in size. Lungs clear. No effusions or edema. No acute bony abnormality. IMPRESSION: No active cardiopulmonary disease. Electronically Signed   By: Rolm Baptise M.D.   On: 03/26/2019 10:45   Ct Head Wo Contrast  Result Date: 03/26/2019 CLINICAL DATA:  Headache for several days. EXAM: CT HEAD WITHOUT CONTRAST TECHNIQUE: Contiguous axial images were obtained from the base of the skull through the vertex without intravenous contrast. COMPARISON:  CT head 10/23/2011 FINDINGS: Brain: No evidence of acute infarction, hemorrhage, hydrocephalus, extra-axial collection or mass lesion/mass effect. Vascular: No hyperdense vessel or unexpected calcification. Skull: Normal. Negative for fracture or focal lesion. Sinuses/Orbits: No acute finding. Other: None. IMPRESSION: No acute intracranial process. Electronically Signed   By: Audie Pinto M.D.   On: 03/26/2019 14:13   Nm Pulmonary Perf And Vent  Result Date: 03/26/2019 CLINICAL DATA:  Chest pain with positive D-dimer study EXAM: NUCLEAR MEDICINE VENTILATION - PERFUSION LUNG SCAN VIEWS: Anterior, posterior, left lateral, right lateral, RPO, LPO, RAO, LAO-ventilation and perfusion RADIOPHARMACEUTICALS:  29.714 mCi of Tc-49m DTPA aerosol inhalation and 4.763 mCi Tc42m MAA IV COMPARISON:  Chest radiograph March 26, 2019 FINDINGS: Ventilation: Radiotracer uptake bilaterally is homogeneous and  symmetric bilaterally. No ventilation defects are appreciable. Perfusion: Radiotracer uptake is homogeneous and symmetric bilaterally. No perfusion defects are appreciable. IMPRESSION: No appreciable ventilation or perfusion defects. Very low probability of pulmonary embolus. Electronically Signed   By: Lowella Grip III M.D.   On: 03/26/2019 17:32    Pending Labs Unresulted Labs (From admission, onward)    Start     Ordered   03/27/19 8185  Basic metabolic panel  Tomorrow morning,   STAT     03/26/19 1532   03/27/19 0500  CBC  Tomorrow morning,   STAT     03/26/19 1532          Vitals/Pain Today's Vitals   03/26/19 2300 03/26/19 2330 03/27/19 0000 03/27/19 0027  BP: (!) 150/74 134/66 (!) 157/79   Pulse: 84 78 80   Resp: 20 18 (!) 24   Temp:      TempSrc:      SpO2: 91% 91% 91%   Weight:      Height:      PainSc:    Asleep    Isolation Precautions No active isolations  Medications Medications  acetaminophen (TYLENOL) tablet 650 mg (has no administration in time range)  aspirin EC tablet 81 mg (has no administration in time range)  amLODipine (NORVASC) tablet 10 mg (has no administration in time range)  gemfibrozil (LOPID) tablet 600 mg (600 mg Oral Given 03/26/19 1920)  hydrALAZINE (APRESOLINE) tablet 50 mg (50 mg Oral Not Given 03/26/19 2252)  isosorbide mononitrate (IMDUR) 24 hr tablet 30 mg (has no administration in time range)  metoprolol succinate (TOPROL-XL) 24 hr tablet 50 mg (has no administration in time range)  nicotine (NICODERM CQ - dosed in mg/24 hours) patch 21 mg (21 mg Transdermal Refused 03/26/19 1853)  insulin glargine (LANTUS) injection 12 Units (12 Units Subcutaneous Given 03/26/19 1926)  pantoprazole (PROTONIX) EC tablet 40 mg (has no administration in time range)  ferrous gluconate (FERGON) tablet 324 mg (has no administration in time range)  albuterol (PROVENTIL) (2.5 MG/3ML) 0.083% nebulizer solution 2.5 mg (2.5 mg Inhalation Not Given 03/26/19  2221)  mometasone-formoterol (DULERA) 200-5 MCG/ACT inhaler 2 puff (2 puffs Inhalation Given 03/26/19 2234)  clobetasol cream (TEMOVATE) 6.31 % 1 application (has no administration in time range)  furosemide (LASIX) injection 40  mg (40 mg Intravenous Refused 03/26/19 1853)  heparin injection 5,000 Units (5,000 Units Subcutaneous Given 03/26/19 2232)  ondansetron (ZOFRAN) tablet 4 mg (has no administration in time range)    Or  ondansetron (ZOFRAN) injection 4 mg (has no administration in time range)  insulin aspart (novoLOG) injection 0-9 Units (1 Units Subcutaneous Given 03/26/19 1848)  insulin aspart (novoLOG) injection 0-5 Units (0 Units Subcutaneous Not Given 03/26/19 2223)  ondansetron (ZOFRAN) injection 4 mg (4 mg Intravenous Given 03/26/19 1139)  morphine 4 MG/ML injection 4 mg (4 mg Intravenous Given 03/26/19 1142)  albuterol (PROVENTIL) (2.5 MG/3ML) 0.083% nebulizer solution 2.5 mg (2.5 mg Nebulization Given 03/26/19 1142)  metoCLOPramide (REGLAN) injection 10 mg (10 mg Intravenous Given 03/26/19 1329)  technetium albumin aggregated (MAA) injection solution 4 millicurie (6.195 millicuries Intravenous Contrast Given 03/26/19 1650)  technetium TC 53M diethylenetriame-pentaacetic acid (DTPA) injection 30 millicurie (09.326 millicuries Intravenous Given 03/26/19 1625)    Mobility walks Low fall risk   Focused Assessments n/a   R Recommendations: See Admitting Provider Note  Report given to:   Additional Notes: n/a

## 2019-03-27 NOTE — Progress Notes (Signed)
Progress Note  Patient Name: Larry Callahan Date of Encounter: 03/27/2019  Primary Cardiologist: Rockey Situ  Subjective   Remains SOB, possibly slightly improved. No chest pain. VQ very low probability for PE. HGB 10.3-->9.6. SCr stable at 1.65. Potasium 3.3.   Inpatient Medications    Scheduled Meds: . albuterol  2.5 mg Inhalation Q6H  . amLODipine  10 mg Oral Daily  . aspirin EC  81 mg Oral Daily  . ferrous gluconate  324 mg Oral Q breakfast  . gemfibrozil  600 mg Oral BID WC  . heparin  5,000 Units Subcutaneous Q8H  . hydrALAZINE  50 mg Oral TID  . insulin aspart  0-5 Units Subcutaneous QHS  . insulin aspart  0-9 Units Subcutaneous TID WC  . insulin glargine  12 Units Subcutaneous QPM  . isosorbide mononitrate  30 mg Oral Daily  . metoprolol succinate  50 mg Oral QHS  . mometasone-formoterol  2 puff Inhalation BID  . nicotine  21 mg Transdermal Daily  . pantoprazole  40 mg Oral Daily  . potassium chloride  40 mEq Oral Once   Continuous Infusions:  PRN Meds: acetaminophen, clobetasol cream, ipratropium-albuterol, ondansetron **OR** ondansetron (ZOFRAN) IV   Vital Signs    Vitals:   03/27/19 0133 03/27/19 0201 03/27/19 0733 03/27/19 0740  BP: (!) 150/75  (!) 165/80   Pulse: 86  78   Resp: 20  18   Temp: 97.8 F (36.6 C)  97.6 F (36.4 C)   TempSrc: Oral  Oral   SpO2: 99% 97% 97% 97%  Weight: 120.8 kg     Height: 5\' 11"  (1.803 m)       Intake/Output Summary (Last 24 hours) at 03/27/2019 0948 Last data filed at 03/27/2019 0816 Gross per 24 hour  Intake -  Output 550 ml  Net -550 ml   Filed Weights   03/26/19 0957 03/27/19 0133  Weight: 122 kg 120.8 kg    Telemetry    SR with occasional PVCs - Personally Reviewed  ECG    Not available for review - Personally Reviewed  Physical Exam   GEN: No acute distress.   Neck: No JVD. Cardiac: RRR, no murmurs, rubs, or gallops.  Respiratory: Clear to auscultation bilaterally.  GI: Obese, soft,  nontender, non-distended.   MS: No edema; No deformity. Neuro:  Alert and oriented x 3; Nonfocal.  Psych: Normal affect.  Labs    Chemistry Recent Labs  Lab 03/26/19 0958 03/27/19 0443  NA 142 141  K 3.5 3.3*  CL 112* 111  CO2 20* 17*  GLUCOSE 144* 127*  BUN 22 23  CREATININE 1.69* 1.65*  CALCIUM 9.7 9.0  GFRNONAA 42* 43*  GFRAA 48* 50*  ANIONGAP 10 13     Hematology Recent Labs  Lab 03/26/19 0958 03/27/19 0443  WBC 9.2 8.3  RBC 4.03* 3.80*  HGB 10.3* 9.6*  HCT 33.5* 31.7*  MCV 83.1 83.4  MCH 25.6* 25.3*  MCHC 30.7 30.3  RDW 16.7* 16.8*  PLT 304 269    Cardiac EnzymesNo results for input(s): TROPONINI in the last 168 hours. No results for input(s): TROPIPOC in the last 168 hours.   BNP Recent Labs  Lab 03/26/19 0958  BNP 184.0*     DDimer No results for input(s): DDIMER in the last 168 hours.   Radiology    Dg Chest 2 View  Result Date: 03/26/2019 IMPRESSION: No active cardiopulmonary disease. Electronically Signed   By: Rolm Baptise M.D.  On: 03/26/2019 10:45   Ct Head Wo Contrast  Result Date: 03/26/2019 IMPRESSION: No acute intracranial process. Electronically Signed   By: Audie Pinto M.D.   On: 03/26/2019 14:13   Nm Pulmonary Perf And Vent  Result Date: 03/26/2019 IMPRESSION: No appreciable ventilation or perfusion defects. Very low probability of pulmonary embolus. Electronically Signed   By: Lowella Grip III M.D.   On: 03/26/2019 17:32    Cardiac Studies   2D echo 02/2019: 1. Left ventricular ejection fraction, by visual estimation, is 55 to 60%. The left ventricle has normal function. Normal left ventricular size. Left ventricular septal wall thickness was mildly increased. Mildly increased left ventricular posterior wall thickness. There is mildly increased left ventricular hypertrophy.  2. Elevated left ventricular end-diastolic pressure.  3. Left ventricular diastolic Doppler parameters are consistent with  pseudonormalization pattern of LV diastolic filling.  4. Global right ventricle has normal systolic function.The right ventricular size is normal. No increase in right ventricular wall thickness.  5. Left atrial size was severely dilated.  6. Right atrial size was normal.  7. The mitral valve is normal in structure. Trace mitral valve regurgitation. No evidence of mitral stenosis.  8. The tricuspid valve is normal in structure. Tricuspid valve regurgitation is trivial.  9. The aortic valve is normal in structure. Aortic valve regurgitation was not visualized by color flow Doppler. Mild aortic valve sclerosis without stenosis. 10. The pulmonic valve was normal in structure. Pulmonic valve regurgitation is trivial by color flow Doppler. 11. Moderately elevated pulmonary artery systolic pressure. 12. The inferior vena cava is dilated in size with >50% respiratory variability, suggesting right atrial pressure of 8 mmHg.  Patient Profile     63 y.o. 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 Leslye Peer.  Assessment & Plan    1. Dyspnea: -Symptoms persist -Uncertain etiology, though likely multifactorial including underlying HFpEF, pulmonary hypertension, possible component of undiagnosed pulmonary disease given prolonged tobacco abuse, morbid obesity, sleep apnea with possible OHS, anemia, and noncompliance -Patient does not appear to be grossly volume overloaded on exam or with objective data including labs and imaging -CheckReDs vest -VQ scan very low probability for PE -Recent Myoview no significant ischemia -Given persistent  symptoms, plan for RHC on 03/28/2019 -He would benefit from outpatient pulmonology eval if the above cardiac workup is unrevealing given extensive tobacco abuse history smoking since age 49 and quitting in 08/2018 -Cannot rule out potential noncompliance with diet, CPAP, and medications -Would not aggressively diurese -Risks and benefits of cardiac catheterization have been discussed with the patient including risks of bleeding, bruising, infection, kidney damage, stroke, heart attack, and death. The patient understands these risks and is willing to proceed with the procedure. All questions have been answered and concerns listened to  2.  HFpEF/pulmonary hypertension: -Appears well compensated and euvolemic -Recent echo as above -Patient indicates he has been noncompliant with Lasix over the past 2 days prior to admission, stating he was "peeing too much" -Stop IV Lasix -Continue PTA PO Lasix 20 mg daily  3.  Atypical chest pain: -Currently symptom-free -High sensitivity troponin negative x 2 -Recent Myoview without significant ischemia and initially read as high  risk though this appears to have been only secondary to reduced LV systolic function which appears to have been falsely low in the setting of poor images with reduced tracer uptake with subsequent echo demonstrating preserved LV systolic function -ASA -VQ very low risk for PE  4.  Headache/night sweats: -CT head nonacute -Of uncertain etiology -Per IM  5.  Insulin-dependent diabetes: -Per IM  6.  Morbid obesity with sleep apnea: -Weight loss advised -Compliance with CPAP recommended  7.  CKD stage II with solitary kidney: -Stable -Hold IV Lasix this morning -Would not aggressively diurese  8.  Anemia: -Stable -Uncertain etiology -Likely playing a role in his above presentation -Further work-up per IM  9. HTN: -BP remains suboptimally controlled -Increase hydralazine to 100 mg tid -Continue amlodipine and  metoprolol    For questions or updates, please contact [place] Please consult [email or web address] for contact info under Cardiology/STEMI.    Signed, Christell Faith, PA-C [facility] Pager: [phone] 03/27/2019, 9:48 AM

## 2019-03-28 ENCOUNTER — Ambulatory Visit: Payer: Medicare PPO | Admitting: Family

## 2019-03-28 DIAGNOSIS — J449 Chronic obstructive pulmonary disease, unspecified: Secondary | ICD-10-CM

## 2019-03-28 DIAGNOSIS — I420 Dilated cardiomyopathy: Secondary | ICD-10-CM

## 2019-03-28 DIAGNOSIS — I272 Pulmonary hypertension, unspecified: Secondary | ICD-10-CM

## 2019-03-28 LAB — BASIC METABOLIC PANEL
Anion gap: 10 (ref 5–15)
BUN: 25 mg/dL — ABNORMAL HIGH (ref 8–23)
CO2: 20 mmol/L — ABNORMAL LOW (ref 22–32)
Calcium: 8.9 mg/dL (ref 8.9–10.3)
Chloride: 112 mmol/L — ABNORMAL HIGH (ref 98–111)
Creatinine, Ser: 1.79 mg/dL — ABNORMAL HIGH (ref 0.61–1.24)
GFR calc Af Amer: 45 mL/min — ABNORMAL LOW (ref >60)
GFR calc non Af Amer: 39 mL/min — ABNORMAL LOW (ref >60)
Glucose, Bld: 172 mg/dL — ABNORMAL HIGH (ref 70–99)
Potassium: 3.7 mmol/L (ref 3.5–5.1)
Sodium: 142 mmol/L (ref 135–145)

## 2019-03-28 LAB — CBC
HCT: 30.5 % — ABNORMAL LOW (ref 39.0–52.0)
Hemoglobin: 9.1 g/dL — ABNORMAL LOW (ref 13.0–17.0)
MCH: 25.3 pg — ABNORMAL LOW (ref 26.0–34.0)
MCHC: 29.8 g/dL — ABNORMAL LOW (ref 30.0–36.0)
MCV: 84.7 fL (ref 80.0–100.0)
Platelets: 242 10*3/uL (ref 150–400)
RBC: 3.6 MIL/uL — ABNORMAL LOW (ref 4.22–5.81)
RDW: 16.6 % — ABNORMAL HIGH (ref 11.5–15.5)
WBC: 6.3 10*3/uL (ref 4.0–10.5)
nRBC: 0 % (ref 0.0–0.2)

## 2019-03-28 LAB — GLUCOSE, CAPILLARY
Glucose-Capillary: 145 mg/dL — ABNORMAL HIGH (ref 70–99)
Glucose-Capillary: 140 mg/dL — ABNORMAL HIGH (ref 70–99)
Glucose-Capillary: 106 mg/dL — ABNORMAL HIGH (ref 70–99)
Glucose-Capillary: 170 mg/dL — ABNORMAL HIGH (ref 70–99)
Glucose-Capillary: 172 mg/dL — ABNORMAL HIGH (ref 70–99)

## 2019-03-28 MED ORDER — FUROSEMIDE 10 MG/ML IJ SOLN
40.0000 mg | Freq: Once | INTRAMUSCULAR | Status: AC
  Administered 2019-03-28: 40 mg via INTRAVENOUS
  Filled 2019-03-28: qty 4

## 2019-03-28 MED ORDER — ASPIRIN 81 MG PO CHEW
CHEWABLE_TABLET | ORAL | Status: AC
  Administered 2019-03-28: 81 mg
  Filled 2019-03-28: qty 1

## 2019-03-28 MED ORDER — BISACODYL 5 MG PO TBEC: 5.0000 mg | DELAYED_RELEASE_TABLET | Freq: Every day | ORAL | Status: DC | PRN

## 2019-03-28 MED ORDER — SODIUM CHLORIDE 0.9% FLUSH: 3.0000 mL | INTRAVENOUS | Status: DC | PRN

## 2019-03-28 MED ORDER — SODIUM CHLORIDE 0.9 % IV SOLN: 250.0000 mL | INTRAVENOUS | Status: DC | PRN

## 2019-03-28 MED ORDER — SODIUM CHLORIDE 0.9 % IV SOLN
INTRAVENOUS | Status: DC
  Administered 2019-03-29: 06:00:00 via INTRAVENOUS

## 2019-03-28 MED ORDER — FLEET ENEMA 7-19 GM/118ML RE ENEM
1.0000 | ENEMA | Freq: Once | RECTAL | Status: AC
  Administered 2019-03-28: 1 via RECTAL

## 2019-03-28 MED ORDER — SODIUM CHLORIDE 0.9% FLUSH
3.0000 mL | Freq: Two times a day (BID) | INTRAVENOUS | Status: DC
  Administered 2019-03-28 – 2019-03-31 (×5): 3 mL via INTRAVENOUS

## 2019-03-28 NOTE — Progress Notes (Signed)
Patient previously on CPAP pressure of 10, requested lower pressure, placed on pressure of 8. Patient states CPAP pressure at 8 is too much, requesting decreased pressure, but cannot remember his home CPAP pressure settings.  Per patient request, placed on CPAP Pressure of 6 with 2L Oxygen bleed in. Patient happy and tolerating well. Resting comfortably in bed.

## 2019-03-28 NOTE — Progress Notes (Signed)
Patient ID: Larry Callahan, male   DOB: 1955-12-27, 63 y.o.   MRN: [id number]  [facility] PROGRESS NOTE  Larry Callahan SWF:[id number] DOB: 06/21/1955 DOA: 03/26/2019 PCP: [name], NP  HPI/Subjective: Patient seen this morning while he was on the BiPAP.  He still feeling short of breath.  No complaints of chest pain.  Objective: Vitals:   03/28/19 0843 03/28/19 1238  BP: (!) 168/98 (!) 172/90  Pulse: 66   Resp: (!) 28   Temp: 97.9 F (36.6 C)   SpO2: 97%     Intake/Output Summary (Last 24 hours) at 03/28/2019 1251 Last data filed at 03/28/2019 0522 Gross per 24 hour  Intake -  Output 925 ml  Net -925 ml   Filed Weights   03/27/19 0133 03/28/19 0340 03/28/19 0843  Weight: 120.8 kg 122.2 kg 122.2 kg    ROS: Review of Systems  Constitutional: Negative for chills and fever.  Eyes: Negative for blurred vision.  Respiratory: Positive for shortness of breath. Negative for cough.   Cardiovascular: Negative for chest pain.  Gastrointestinal: Negative for abdominal pain, constipation, diarrhea, nausea and vomiting.  Genitourinary: Negative for dysuria.  Musculoskeletal: Negative for joint pain.  Neurological: Negative for dizziness and headaches.   Exam: Physical Exam  Constitutional: He is oriented to person, place, and time.  HENT:  Nose: No mucosal edema.  Mouth/Throat: No oropharyngeal exudate or posterior oropharyngeal edema.  Eyes: Pupils are equal, round, and reactive to light. Conjunctivae, EOM and lids are normal.  Neck: No JVD present. Carotid bruit is not present. No edema present. No thyroid mass and no thyromegaly present.  Cardiovascular: S1 normal and S2 normal. Exam reveals no gallop.  No murmur heard. Pulses:      Dorsalis pedis pulses are 2+ on the right side and 2+ on the left side.  Respiratory: No respiratory distress. He has decreased breath sounds in the right lower field and the left lower field. He has no wheezes. He  has no rhonchi. He has no rales.  GI: Soft. Bowel sounds are normal. There is no abdominal tenderness.  Musculoskeletal:     Right ankle: He exhibits swelling.     Left ankle: He exhibits swelling.  Lymphadenopathy:    He has no cervical adenopathy.  Neurological: He is alert and oriented to person, place, and time. No cranial nerve deficit.  Skin: Skin is warm. No rash noted. Nails show no clubbing.  Psychiatric: He has a normal mood and affect.      Data Reviewed: Basic Metabolic Panel: Recent Labs  Lab 03/26/19 0958 03/27/19 0443 03/28/19 0559  NA 142 141 142  K 3.5 3.3* 3.7  CL 112* 111 112*  CO2 20* 17* 20*  GLUCOSE 144* 127* 172*  BUN 22 23 25*  CREATININE 1.69* 1.65* 1.79*  CALCIUM 9.7 9.0 8.9   CBC: Recent Labs  Lab 03/26/19 0958 03/27/19 0443 03/28/19 0559  WBC 9.2 8.3 6.3  HGB 10.3* 9.6* 9.1*  HCT 33.5* 31.7* 30.5*  MCV 83.1 83.4 84.7  PLT 304 269 242   BNP (last 3 results) Recent Labs    03/01/19 0956 03/14/19 1512 03/26/19 0958  BNP 322.0* 102.0* 184.0*    CBG: Recent Labs  Lab 03/27/19 1702 03/27/19 2043 03/28/19 0714 03/28/19 0852 03/28/19 1230  GLUCAP 200* 141* 145* 140* 106*    Recent Results (from the past 240 hour(s))  SARS CORONAVIRUS 2 (TAT 6-24 HRS) Nasopharyngeal Nasopharyngeal Swab     Status: None  Collection Time: 03/26/19  1:48 PM   Specimen: Nasopharyngeal Swab  Result Value Ref Range Status   SARS Coronavirus 2 NEGATIVE NEGATIVE Final    Comment: (NOTE) SARS-CoV-2 target nucleic acids are NOT DETECTED. The SARS-CoV-2 RNA is generally detectable in upper and lower respiratory specimens during the acute phase of infection. Negative results do not preclude SARS-CoV-2 infection, do not rule out co-infections with other pathogens, and should not be used as the sole basis for treatment or other patient management decisions. Negative results must be combined with clinical observations, patient history, and  epidemiological information. The expected result is Negative. Fact Sheet for Patients: SugarRoll.be Fact Sheet for Healthcare Providers: https://www.woods-mathews.com/ This test is not yet approved or cleared by the [place] FDA and  has been authorized for detection and/or diagnosis of SARS-CoV-2 by FDA under an Emergency Use Authorization (EUA). This EUA will remain  in effect (meaning this test can be used) for the duration of the COVID-19 declaration under Section 56 4(b)(1) of the Act, 21 U.S.C. section 360bbb-3(b)(1), unless the authorization is terminated or revoked sooner. Performed at [facility], [place] [address] [place], [place] [address]      Studies: Ct Head Wo Contrast  Result Date: 03/26/2019 CLINICAL DATA:  Headache for several days. EXAM: CT HEAD WITHOUT CONTRAST TECHNIQUE: Contiguous axial images were obtained from the base of the skull through the vertex without intravenous contrast. COMPARISON:  CT head 10/23/2011 FINDINGS: Brain: No evidence of acute infarction, hemorrhage, hydrocephalus, extra-axial collection or mass lesion/mass effect. Vascular: No hyperdense vessel or unexpected calcification. Skull: Normal. Negative for fracture or focal lesion. Sinuses/Orbits: No acute finding. Other: None. IMPRESSION: No acute intracranial process. Electronically Signed   By: Audie Pinto M.D.   On: 03/26/2019 14:13   Nm Pulmonary Perf And Vent  Result Date: 03/26/2019 CLINICAL DATA:  Chest pain with positive D-dimer study EXAM: NUCLEAR MEDICINE VENTILATION - PERFUSION LUNG SCAN VIEWS: Anterior, posterior, left lateral, right lateral, RPO, LPO, RAO, LAO-ventilation and perfusion RADIOPHARMACEUTICALS:  29.714 mCi of Tc-78m DTPA aerosol inhalation and 4.763 mCi Tc66m MAA IV COMPARISON:  Chest radiograph March 26, 2019 FINDINGS: Ventilation: Radiotracer uptake bilaterally is homogeneous and symmetric bilaterally. No  ventilation defects are appreciable. Perfusion: Radiotracer uptake is homogeneous and symmetric bilaterally. No perfusion defects are appreciable. IMPRESSION: No appreciable ventilation or perfusion defects. Very low probability of pulmonary embolus. Electronically Signed   By: Lowella Grip III M.D.   On: 03/26/2019 17:32    Scheduled Meds: . albuterol  2.5 mg Inhalation Q6H  . amLODipine  10 mg Oral Daily  . aspirin EC  81 mg Oral Daily  . ferrous gluconate  324 mg Oral Q breakfast  . furosemide  40 mg Intravenous Once  . gemfibrozil  600 mg Oral BID WC  . heparin  5,000 Units Subcutaneous Q8H  . hydrALAZINE  100 mg Oral TID  . insulin aspart  0-5 Units Subcutaneous QHS  . insulin aspart  0-9 Units Subcutaneous TID WC  . insulin glargine  12 Units Subcutaneous QPM  . isosorbide mononitrate  30 mg Oral Daily  . loratadine  10 mg Oral Daily  . metoprolol succinate  50 mg Oral QHS  . mometasone-formoterol  2 puff Inhalation BID  . nicotine  21 mg Transdermal Daily  . pantoprazole  40 mg Oral Daily   Continuous Infusions:  Assessment/Plan:  1. Acute on chronic diastolic congestive heart failure.  Patient still has shortness of breath and his rhythm this  reading was elevated at 40.  Case discussed with cardiology.  Right heart catheterization was canceled today because the lab needs to be cleaned.  Potential procedure tomorrow.  Diuresis today.  VQ scan negative. 2. COPD.  Continue inhalers.  Continue to hold off on any steroids.  This is not COPD exacerbation. 3. Chronic kidney disease stage III.  Patient has 1 kidney and chronic kidney disease. 4. Type 2 diabetes mellitus.  On long-acting insulin and sliding scale. 5. Tobacco abuse on nicotine patch 6. Hypertension continue usual medications  Code Status:     Code Status Orders  (From admission, onward)         Start     Ordered   03/26/19 1532  Full code  Continuous     03/26/19 1532        Code Status History     Date Active Date Inactive Code Status Order ID Comments User Context   03/01/2019 1536 03/05/2019 2055 Full Code [id number]  Loletha Grayer, MD ED   Advance Care Planning Activity     Family Communication: Tried to call the patient's daughter at [phone] but there was no answer Disposition Plan: To be determined  Consultants:  Cardiology  Time spent: 25 minutes  [place]

## 2019-03-28 NOTE — Progress Notes (Signed)
Progress Note  Patient Name: Larry Callahan Date of Encounter: 03/28/2019  Primary Cardiologist: Rockey Situ  Subjective   SOB is unchanged. No chest pain or palpitations.  BUN 23-->25, SCr 1.65-->1.79, potassium 3.3-->3.7, HGB 9.6-->9.1. For [place] today.   Inpatient Medications    Scheduled Meds: . albuterol  2.5 mg Inhalation Q6H  . amLODipine  10 mg Oral Daily  . aspirin EC  81 mg Oral Daily  . ferrous gluconate  324 mg Oral Q breakfast  . gemfibrozil  600 mg Oral BID WC  . heparin  5,000 Units Subcutaneous Q8H  . hydrALAZINE  100 mg Oral TID  . insulin aspart  0-5 Units Subcutaneous QHS  . insulin aspart  0-9 Units Subcutaneous TID WC  . insulin glargine  12 Units Subcutaneous QPM  . isosorbide mononitrate  30 mg Oral Daily  . loratadine  10 mg Oral Daily  . metoprolol succinate  50 mg Oral QHS  . mometasone-formoterol  2 puff Inhalation BID  . nicotine  21 mg Transdermal Daily  . pantoprazole  40 mg Oral Daily  . sodium chloride flush  3 mL Intravenous Q12H   Continuous Infusions: . sodium chloride    . sodium chloride     PRN Meds: sodium chloride, acetaminophen, bisacodyl, clobetasol cream, ipratropium-albuterol, ondansetron **OR** ondansetron (ZOFRAN) IV, sodium chloride flush   Vital Signs    Vitals:   03/28/19 0133 03/28/19 0339 03/28/19 0340 03/28/19 0713  BP:  (!) 150/68  131/70  Pulse:  86  61  Resp:  18  17  Temp:  97.8 F (36.6 C)  98.2 F (36.8 C)  TempSrc:  Oral  Oral  SpO2: 97% 97%  98%  Weight:   122.2 kg   Height:        Intake/Output Summary (Last 24 hours) at 03/28/2019 0758 Last data filed at 03/28/2019 0522 Gross per 24 hour  Intake 480 ml  Output 1800 ml  Net -1320 ml   Filed Weights   03/26/19 0957 03/27/19 0133 03/28/19 0340  Weight: 122 kg 120.8 kg 122.2 kg    Telemetry    SR with PACs, short runs of atrial tachycardia - Personally Reviewed  ECG    No new tracings - Personally Reviewed  Physical Exam    GEN: No acute distress.   Neck: No JVD. Cardiac: RRR, no murmurs, rubs, or gallops.  Respiratory: Clear to auscultation bilaterally.  GI: Soft, nontender, non-distended.   MS: No edema; No deformity. Neuro:  Alert and oriented x 3; Nonfocal.  Psych: Normal affect.  Labs    Chemistry Recent Labs  Lab 03/26/19 0958 03/27/19 0443 03/28/19 0559  NA 142 141 142  K 3.5 3.3* 3.7  CL 112* 111 112*  CO2 20* 17* 20*  GLUCOSE 144* 127* 172*  BUN 22 23 25*  CREATININE 1.69* 1.65* 1.79*  CALCIUM 9.7 9.0 8.9  GFRNONAA 42* 43* 39*  GFRAA 48* 50* 45*  ANIONGAP 10 13 10      Hematology Recent Labs  Lab 03/26/19 0958 03/27/19 0443 03/28/19 0559  WBC 9.2 8.3 6.3  RBC 4.03* 3.80* 3.60*  HGB 10.3* 9.6* 9.1*  HCT 33.5* 31.7* 30.5*  MCV 83.1 83.4 84.7  MCH 25.6* 25.3* 25.3*  MCHC 30.7 30.3 29.8*  RDW 16.7* 16.8* 16.6*  PLT 304 269 242    Cardiac EnzymesNo results for input(s): TROPONINI in the last 168 hours. No results for input(s): TROPIPOC in the last 168 hours.   BNP Recent Labs  Lab 03/26/19 0958  BNP 184.0*     DDimer No results for input(s): DDIMER in the last 168 hours.   Radiology    Dg Chest 2 View  Result Date: 03/26/2019 IMPRESSION: No active cardiopulmonary disease. Electronically Signed   By: Rolm Baptise M.D.   On: 03/26/2019 10:45   Ct Head Wo Contrast  Result Date: 03/26/2019 IMPRESSION: No acute intracranial process. Electronically Signed   By: Audie Pinto M.D.   On: 03/26/2019 14:13   Nm Pulmonary Perf And Vent  Result Date: 03/26/2019 IMPRESSION: No appreciable ventilation or perfusion defects. Very low probability of pulmonary embolus. Electronically Signed   By: Lowella Grip III M.D.   On: 03/26/2019 17:32    Cardiac Studies   2D echo 02/2019: 1. Left ventricular ejection fraction, by visual estimation, is 55 to 60%. The left ventricle has normal function. Normal left ventricular size. Left ventricular septal wall thickness was  mildly increased. Mildly increased left ventricular posterior wall thickness. There is mildly increased left ventricular hypertrophy. 2. Elevated left ventricular end-diastolic pressure. 3. Left ventricular diastolic Doppler parameters are consistent with pseudonormalization pattern of LV diastolic filling. 4. Global right ventricle has normal systolic function.The right ventricular size is normal. No increase in right ventricular wall thickness. 5. Left atrial size was severely dilated. 6. Right atrial size was normal. 7. The mitral valve is normal in structure. Trace mitral valve regurgitation. No evidence of mitral stenosis. 8. The tricuspid valve is normal in structure. Tricuspid valve regurgitation is trivial. 9. The aortic valve is normal in structure. Aortic valve regurgitation was not visualized by color flow Doppler. Mild aortic valve sclerosis without stenosis. 10. The pulmonic valve was normal in structure. Pulmonic valve regurgitation is trivial by color flow Doppler. 11. Moderately elevated pulmonary artery systolic pressure. 12. The inferior vena cava is dilated in size with >50% respiratory variability, suggesting right atrial pressure of 8 mmHg.  Patient Profile     63 y.o. male with history of nonobstructive CAD by LHC in 2011, HFpEF, pulmonary hypertension, prolonged tobacco abuse smoking from age 34 through 33 quitting in 08/2018, insulin-dependent diabetes, CKD stage II with solitary kidney,poorly controlled hypertension, anemia, obesity, OSA with reported compliance with CPAP, possible OHS, and noncompliancewho is being seen today for the evaluation of dyspneaat the request of Dr. Leslye Peer.  Assessment & Plan    1. Dyspnea: -Symptoms persist -Uncertain etiology, though likely multifactorial including underlying HFpEF, pulmonary hypertension, possible component of undiagnosed pulmonary disease given prolonged tobacco abuse, morbid obesity, sleep apnea with possible  OHS, anemia, and noncompliance -ReDs vest on 10/14 of 40 -VQ scan very low probability for PE -Recent Myoview no significant ischemia -He is for RHC today to help gauge diuresis, especially in the setting of his solitary kidney and underlying CKD -He would benefit from outpatient pulmonology eval if the above cardiac workup is unrevealing given extensive tobacco abuse history smoking since age 47 and quitting in 08/2018 -Cannot rule out potential noncompliance with diet, CPAP, and medications -Risks and benefits of cardiac catheterization have been discussed with the patient including risks of bleeding, bruising, infection, kidney damage, stroke, heart attack, and death. The patient understands these risks and is willing to proceed with the procedure. All questions have been answered and concerns listened to  2.HFpEF/pulmonary hypertension: -ReDs vest as above -RHC today as above to help gauge diuresis  -Poor insight  -Recent echo as above -Patient indicates he has been noncompliant with Lasix over the past 2 days prior  to admission, stating he was "peeing too much" -Lasix on hold at this time given acute on CKD  3.Atypical chest pain: -Currently symptom-free -High sensitivity troponin negative x 2 -Recent Myoview without significant ischemia and initially read as high risk though this appears to have been only secondary to reduced LV systolic function which appears to have been falsely low in the setting of poor images with reduced tracer uptake with subsequent echo demonstrating preserved LV systolic function -Plan was to defer LHC at this time given recent Myoview, solitary kidney, and CKD -ASA -VQ very low risk for PE  4.Headache/night sweats: -CT head nonacute -Of uncertain etiology -Per IM  5.Insulin-dependent diabetes: -Per IM  6.Morbid obesity with sleep apnea: -Weight loss advised -Compliance with CPAP recommended  7.CKD stage II with solitary kidney:  -Slight bump inSCr this morning -RHC as above  8.Anemia: -Relatively stable -Uncertain etiology -Likely playing a role in his above presentation -Further work-up per IM  9. HTN: -Improved -Continue higher dose hydralazine 100 mg tid along with amlodipine and metoprolol    For questions or updates, please contact [place] Please consult [email or web address] for contact info under Cardiology/STEMI.    Signed, Christell Faith, PA-C [facility] Pager: [phone] 03/28/2019, 7:58 AM

## 2019-03-29 ENCOUNTER — Encounter
Admission: EM | Disposition: A | Discharge status: Home / Self Care | Payer: Self-pay | Source: Home / Self Care | Attending: Internal Medicine

## 2019-03-29 DIAGNOSIS — N1832 Chronic kidney disease, stage 3b: Secondary | ICD-10-CM

## 2019-03-29 DIAGNOSIS — E118 Type 2 diabetes mellitus with unspecified complications: Secondary | ICD-10-CM

## 2019-03-29 DIAGNOSIS — R0600 Dyspnea, unspecified: Secondary | ICD-10-CM

## 2019-03-29 HISTORY — PX: RIGHT HEART CATH: CATH118263

## 2019-03-29 LAB — GLUCOSE, CAPILLARY
Glucose-Capillary: 156 mg/dL — ABNORMAL HIGH (ref 70–99)
Glucose-Capillary: 122 mg/dL — ABNORMAL HIGH (ref 70–99)
Glucose-Capillary: 101 mg/dL — ABNORMAL HIGH (ref 70–99)
Glucose-Capillary: 192 mg/dL — ABNORMAL HIGH (ref 70–99)
Glucose-Capillary: 268 mg/dL — ABNORMAL HIGH (ref 70–99)

## 2019-03-29 SURGERY — RIGHT HEART CATH
Anesthesia: Moderate Sedation

## 2019-03-29 MED ORDER — MIDAZOLAM HCL 2 MG/2ML IJ SOLN
INTRAMUSCULAR | Status: DC | PRN
  Administered 2019-03-29: 1 mg via INTRAVENOUS

## 2019-03-29 MED ORDER — FUROSEMIDE 40 MG PO TABS
40.0000 mg | ORAL_TABLET | Freq: Every day | ORAL | Status: DC
  Administered 2019-03-30 – 2019-03-31 (×2): 40 mg via ORAL
  Filled 2019-03-29 (×2): qty 1

## 2019-03-29 MED ORDER — FENTANYL CITRATE (PF) 100 MCG/2ML IJ SOLN
INTRAMUSCULAR | Status: AC
  Filled 2019-03-29: qty 2

## 2019-03-29 MED ORDER — TIOTROPIUM BROMIDE MONOHYDRATE 18 MCG IN CAPS
18.0000 ug | ORAL_CAPSULE | Freq: Every day | RESPIRATORY_TRACT | Status: DC
  Administered 2019-03-30 – 2019-03-31 (×2): 18 ug via RESPIRATORY_TRACT
  Filled 2019-03-29: qty 5

## 2019-03-29 MED ORDER — FENTANYL CITRATE (PF) 100 MCG/2ML IJ SOLN
INTRAMUSCULAR | Status: DC | PRN
  Administered 2019-03-29: 25 ug via INTRAVENOUS

## 2019-03-29 MED ORDER — HEPARIN (PORCINE) IN NACL 2000-0.9 UNIT/L-% IV SOLN
INTRAVENOUS | Status: DC | PRN
  Administered 2019-03-29: 500 mL

## 2019-03-29 MED ORDER — ALBUTEROL SULFATE (2.5 MG/3ML) 0.083% IN NEBU
2.5000 mg | INHALATION_SOLUTION | Freq: Three times a day (TID) | RESPIRATORY_TRACT | Status: DC
  Administered 2019-03-30 – 2019-03-31 (×3): 2.5 mg via RESPIRATORY_TRACT
  Filled 2019-03-29 (×4): qty 3

## 2019-03-29 MED ORDER — HEPARIN (PORCINE) IN NACL 1000-0.9 UT/500ML-% IV SOLN
INTRAVENOUS | Status: AC
  Filled 2019-03-29: qty 1000

## 2019-03-29 MED ORDER — MIDAZOLAM HCL 2 MG/2ML IJ SOLN
INTRAMUSCULAR | Status: AC
  Filled 2019-03-29: qty 2

## 2019-03-29 SURGICAL SUPPLY — 7 items
CATH SWANZ 7F THERMO (CATHETERS) ×3 IMPLANT
GUIDEWIRE EMER 3M J .025X150CM (WIRE) ×3 IMPLANT
KIT MANI 3VAL PERCEP (MISCELLANEOUS) ×3 IMPLANT
NEEDLE PERC 18GX7CM (NEEDLE) ×3 IMPLANT
NEEDLE SMART 18G ACCESS (NEEDLE) ×3 IMPLANT
PACK CARDIAC CATH (CUSTOM PROCEDURE TRAY) ×3 IMPLANT
SHEATH AVANTI 7FRX11 (SHEATH) ×3 IMPLANT

## 2019-03-29 NOTE — Progress Notes (Signed)
Patient off of CPAP at this time. States he does not want to wear CPAP the remainder of the night. Patient on 2L [place] resting comfortably in bed. No distress noted. Scheduled breathing treatment given.

## 2019-03-29 NOTE — Care Management Important Message (Signed)
Important Message  Patient Details  Name: Larry Callahan MRN: [id number] Date of Birth: 11-Jan-1956   Medicare Important Message Given:  Yes     Dannette Barbara 03/29/2019, 1:26 PM

## 2019-03-29 NOTE — Progress Notes (Signed)
Patient ID: Larry Callahan, male   DOB: April 06, 1956, 63 y.o.   MRN: [id number]  [employer] PROGRESS NOTE  Ubaldo Daywalt Troiani DGL:[id number] DOB: May 18, 1956 DOA: 03/26/2019 PCP: [name], NP  HPI/Subjective: Patient seen this morning and again this afternoon.  Patient was short of breath this morning.  Was feeling a little bit better this afternoon.  I asked him if he wanted to go home this afternoon and then he told me he is still feeling short of breath.   Objective: Vitals:   03/29/19 1425 03/29/19 1432  BP: (!) 147/79   Pulse: 68   Resp: 17   Temp: 98 F (36.7 C)   SpO2: 96% 95%    Intake/Output Summary (Last 24 hours) at 03/29/2019 1629 Last data filed at 03/29/2019 1500 Gross per 24 hour  Intake 240 ml  Output 1900 ml  Net -1660 ml   Filed Weights   03/28/19 0340 03/28/19 0843 03/29/19 0500  Weight: 122.2 kg 122.2 kg 122.1 kg    ROS: Review of Systems  Constitutional: Negative for chills and fever.  Eyes: Negative for blurred vision.  Respiratory: Positive for shortness of breath. Negative for cough.   Cardiovascular: Negative for chest pain.  Gastrointestinal: Negative for abdominal pain, constipation, diarrhea, nausea and vomiting.  Genitourinary: Negative for dysuria.  Musculoskeletal: Negative for joint pain.  Neurological: Negative for dizziness and headaches.   Exam: Physical Exam  Constitutional: He is oriented to person, place, and time.  HENT:  Nose: No mucosal edema.  Mouth/Throat: No oropharyngeal exudate or posterior oropharyngeal edema.  Eyes: Pupils are equal, round, and reactive to light. Conjunctivae, EOM and lids are normal.  Neck: No JVD present. Carotid bruit is not present. No edema present. No thyroid mass and no thyromegaly present.  Cardiovascular: S1 normal and S2 normal. Exam reveals no gallop.  No murmur heard. Pulses:      Dorsalis pedis pulses are 2+ on the right side and 2+ on the left side.   Respiratory: No respiratory distress. He has decreased breath sounds in the right lower field and the left lower field. He has no wheezes. He has no rhonchi. He has no rales.  GI: Soft. Bowel sounds are normal. There is no abdominal tenderness.  Musculoskeletal:     Right ankle: He exhibits swelling.     Left ankle: He exhibits swelling.  Lymphadenopathy:    He has no cervical adenopathy.  Neurological: He is alert and oriented to person, place, and time. No cranial nerve deficit.  Skin: Skin is warm. No rash noted. Nails show no clubbing.  Psychiatric: He has a normal mood and affect.      Data Reviewed: Basic Metabolic Panel: Recent Labs  Lab 03/26/19 0958 03/27/19 0443 03/28/19 0559  NA 142 141 142  K 3.5 3.3* 3.7  CL 112* 111 112*  CO2 20* 17* 20*  GLUCOSE 144* 127* 172*  BUN 22 23 25*  CREATININE 1.69* 1.65* 1.79*  CALCIUM 9.7 9.0 8.9   CBC: Recent Labs  Lab 03/26/19 0958 03/27/19 0443 03/28/19 0559  WBC 9.2 8.3 6.3  HGB 10.3* 9.6* 9.1*  HCT 33.5* 31.7* 30.5*  MCV 83.1 83.4 84.7  PLT 304 269 242   BNP (last 3 results) Recent Labs    03/01/19 0956 03/14/19 1512 03/26/19 0958  BNP 322.0* 102.0* 184.0*    CBG: Recent Labs  Lab 03/28/19 1704 03/28/19 2050 03/29/19 0726 03/29/19 0858 03/29/19 1353  GLUCAP 170* 172* 156* 122* 101*  Recent Results (from the past 240 hour(s))  SARS CORONAVIRUS 2 (TAT 6-24 HRS) Nasopharyngeal Nasopharyngeal Swab     Status: None   Collection Time: 03/26/19  1:48 PM   Specimen: Nasopharyngeal Swab  Result Value Ref Range Status   SARS Coronavirus 2 NEGATIVE NEGATIVE Final    Comment: (NOTE) SARS-CoV-2 target nucleic acids are NOT DETECTED. The SARS-CoV-2 RNA is generally detectable in upper and lower respiratory specimens during the acute phase of infection. Negative results do not preclude SARS-CoV-2 infection, do not rule out co-infections with other pathogens, and should not be used as the sole basis for  treatment or other patient management decisions. Negative results must be combined with clinical observations, patient history, and epidemiological information. The expected result is Negative. Fact Sheet for Patients: SugarRoll.be Fact Sheet for Healthcare Providers: https://www.woods-mathews.com/ This test is not yet approved or cleared by the [place] FDA and  has been authorized for detection and/or diagnosis of SARS-CoV-2 by FDA under an Emergency Use Authorization (EUA). This EUA will remain  in effect (meaning this test can be used) for the duration of the COVID-19 declaration under Section 56 4(b)(1) of the Act, 21 U.S.C. section 360bbb-3(b)(1), unless the authorization is terminated or revoked sooner. Performed at [facility], [place] [address], [place], [place] [address]      Studies: No results found.  Scheduled Meds: . albuterol  2.5 mg Inhalation Q6H  . amLODipine  10 mg Oral Daily  . aspirin EC  81 mg Oral Daily  . ferrous gluconate  324 mg Oral Q breakfast  . [START ON 03/30/2019] furosemide  40 mg Oral Daily  . gemfibrozil  600 mg Oral BID WC  . heparin  5,000 Units Subcutaneous Q8H  . hydrALAZINE  100 mg Oral TID  . insulin aspart  0-5 Units Subcutaneous QHS  . insulin aspart  0-9 Units Subcutaneous TID WC  . insulin glargine  12 Units Subcutaneous QPM  . isosorbide mononitrate  30 mg Oral Daily  . loratadine  10 mg Oral Daily  . metoprolol succinate  50 mg Oral QHS  . mometasone-formoterol  2 puff Inhalation BID  . nicotine  21 mg Transdermal Daily  . pantoprazole  40 mg Oral Daily  . sodium chloride flush  3 mL Intravenous Q12H  . [START ON 03/30/2019] tiotropium  18 mcg Inhalation Daily   Continuous Infusions:  Assessment/Plan:  1. Acute on chronic diastolic congestive heart failure.  Case discussed with cardiology and his right heart cath showed normal pressures.  VQ scan negative.  Continue Lasix  40 mg daily.  Check BMP in the morning with his chronic kidney disease. 2. COPD.  Continue inhalers.  Continue to hold off on any steroids.  This is not COPD exacerbation. 3. Chronic kidney disease stage III.  Patient has 1 kidney and chronic kidney disease. 4. Type 2 diabetes mellitus.  On long-acting insulin and sliding scale. 5. Tobacco abuse on nicotine patch 6. Hypertension continue usual medications  Code Status:     Code Status Orders  (From admission, onward)         Start     Ordered   03/26/19 1532  Full code  Continuous     03/26/19 1532        Code Status History    Date Active Date Inactive Code Status Order ID Comments User Context   03/01/2019 1536 03/05/2019 2055 Full Code [id number]  Loletha Grayer, MD ED   Advance Care Planning Activity  Family Communication: Tried to call the patient's daughter at [phone] but there was no answer Disposition Plan: Likely home tomorrow morning  Consultants:  Cardiology  Time spent: 28 minutes  [place]

## 2019-03-29 NOTE — Progress Notes (Addendum)
Progress Note  Patient Name: Meyer Arora Shomaker Date of Encounter: 03/29/2019  Primary Cardiologist: Rockey Situ  Subjective   Right heart catheterization today Normal right heart pressures Reports having long smoking history, very deconditioned, weight trending upwards   Inpatient Medications    Scheduled Meds: . [MAR Hold] albuterol  2.5 mg Inhalation Q6H  . [MAR Hold] amLODipine  10 mg Oral Daily  . [MAR Hold] aspirin EC  81 mg Oral Daily  . [MAR Hold] ferrous gluconate  324 mg Oral Q breakfast  . [MAR Hold] gemfibrozil  600 mg Oral BID WC  . [MAR Hold] heparin  5,000 Units Subcutaneous Q8H  . [MAR Hold] hydrALAZINE  100 mg Oral TID  . [MAR Hold] insulin aspart  0-5 Units Subcutaneous QHS  . [MAR Hold] insulin aspart  0-9 Units Subcutaneous TID WC  . [MAR Hold] insulin glargine  12 Units Subcutaneous QPM  . [MAR Hold] isosorbide mononitrate  30 mg Oral Daily  . [MAR Hold] loratadine  10 mg Oral Daily  . [MAR Hold] metoprolol succinate  50 mg Oral QHS  . [MAR Hold] mometasone-formoterol  2 puff Inhalation BID  . [MAR Hold] nicotine  21 mg Transdermal Daily  . [MAR Hold] pantoprazole  40 mg Oral Daily  . [MAR Hold] sodium chloride flush  3 mL Intravenous Q12H   Continuous Infusions: . sodium chloride    . sodium chloride 10 mL/hr at 03/29/19 0555   PRN Meds: sodium chloride, [MAR Hold] acetaminophen, [MAR Hold] bisacodyl, [MAR Hold] clobetasol cream, [MAR Hold] ipratropium-albuterol, [MAR Hold] ondansetron **OR** [MAR Hold] ondansetron (ZOFRAN) IV, sodium chloride flush   Vital Signs    Vitals:   03/29/19 0726 03/29/19 0832 03/29/19 0855 03/29/19 1320  BP: (!) 155/83  (!) 159/82 132/78  Pulse: 69  71 63  Resp: 18  19 18   Temp: 98.1 F (36.7 C)  97.6 F (36.4 C)   TempSrc: Oral  Oral   SpO2: 95% 96% 96% 97%  Weight:      Height:        Intake/Output Summary (Last 24 hours) at 03/29/2019 1326 Last data filed at 03/29/2019 0610 Gross per 24 hour  Intake  240 ml  Output 1925 ml  Net -1685 ml   Filed Weights   03/28/19 0340 03/28/19 0843 03/29/19 0500  Weight: 122.2 kg 122.2 kg 122.1 kg    Telemetry    Normal sinus rhythm- Personally Reviewed  ECG    No new tracings - Personally Reviewed  Physical Exam   Constitutional:  oriented to person, place, and time. No distress.  HENT:  Head: Normocephalic and atraumatic.  Eyes:  no discharge. No scleral icterus.  Neck: Normal range of motion. Neck supple. No JVD present.  Cardiovascular: Normal rate, regular rhythm, normal heart sounds and intact distal pulses. Exam reveals no gallop and no friction rub. No edema No murmur heard. Pulmonary/Chest: Effort normal and breath sounds normal. No stridor. No respiratory distress.  no wheezes.  no rales.  no tenderness.  Abdominal: Soft.  no distension.  no tenderness.  Musculoskeletal: Normal range of motion.  no  tenderness or deformity.  Neurological:  normal muscle tone. Coordination normal. No atrophy Skin: Skin is warm and dry. No rash noted. not diaphoretic.  Psychiatric:  normal mood and affect. behavior is normal. Thought content normal.      Labs    Chemistry Recent Labs  Lab 03/26/19 0958 03/27/19 0443 03/28/19 0559  NA 142 141 142  K  3.5 3.3* 3.7  CL 112* 111 112*  CO2 20* 17* 20*  GLUCOSE 144* 127* 172*  BUN 22 23 25*  CREATININE 1.69* 1.65* 1.79*  CALCIUM 9.7 9.0 8.9  GFRNONAA 42* 43* 39*  GFRAA 48* 50* 45*  ANIONGAP 10 13 10      Hematology Recent Labs  Lab 03/26/19 0958 03/27/19 0443 03/28/19 0559  WBC 9.2 8.3 6.3  RBC 4.03* 3.80* 3.60*  HGB 10.3* 9.6* 9.1*  HCT 33.5* 31.7* 30.5*  MCV 83.1 83.4 84.7  MCH 25.6* 25.3* 25.3*  MCHC 30.7 30.3 29.8*  RDW 16.7* 16.8* 16.6*  PLT 304 269 242    Cardiac EnzymesNo results for input(s): TROPONINI in the last 168 hours. No results for input(s): TROPIPOC in the last 168 hours.   BNP Recent Labs  Lab 03/26/19 0958  BNP 184.0*     DDimer No results for  input(s): DDIMER in the last 168 hours.   Radiology    Dg Chest 2 View  Result Date: 03/26/2019 IMPRESSION: No active cardiopulmonary disease. Electronically Signed   By: Rolm Baptise M.D.   On: 03/26/2019 10:45   Ct Head Wo Contrast  Result Date: 03/26/2019 IMPRESSION: No acute intracranial process. Electronically Signed   By: Audie Pinto M.D.   On: 03/26/2019 14:13   Nm Pulmonary Perf And Vent  Result Date: 03/26/2019 IMPRESSION: No appreciable ventilation or perfusion defects. Very low probability of pulmonary embolus. Electronically Signed   By: Lowella Grip III M.D.   On: 03/26/2019 17:32    Cardiac Studies   2D echo 02/2019: 1. Left ventricular ejection fraction, by visual estimation, is 55 to 60%. The left ventricle has normal function. Normal left ventricular size. Left ventricular septal wall thickness was mildly increased. Mildly increased left ventricular posterior wall thickness. There is mildly increased left ventricular hypertrophy. 2. Elevated left ventricular end-diastolic pressure. 3. Left ventricular diastolic Doppler parameters are consistent with pseudonormalization pattern of LV diastolic filling. 4. Global right ventricle has normal systolic function.The right ventricular size is normal. No increase in right ventricular wall thickness. 5. Left atrial size was severely dilated. 6. Right atrial size was normal. 7. The mitral valve is normal in structure. Trace mitral valve regurgitation. No evidence of mitral stenosis. 8. The tricuspid valve is normal in structure. Tricuspid valve regurgitation is trivial. 9. The aortic valve is normal in structure. Aortic valve regurgitation was not visualized by color flow Doppler. Mild aortic valve sclerosis without stenosis. 10. The pulmonic valve was normal in structure. Pulmonic valve regurgitation is trivial by color flow Doppler. 11. Moderately elevated pulmonary artery systolic pressure. 12. The  inferior vena cava is dilated in size with >50% respiratory variability, suggesting right atrial pressure of 8 mmHg.  Patient Profile     63 y.o. male with history of nonobstructive CAD by [facility] in 2011, HFpEF, pulmonary hypertension, prolonged tobacco abuse smoking from age 20 through 32 quitting in 08/2018, insulin-dependent diabetes, CKD stage II with solitary kidney,poorly controlled hypertension, anemia, obesity, OSA with reported compliance with CPAP, possible OHS, and noncompliancewho is being seen today for the evaluation of dyspneaat the request of Dr. Leslye Peer.  Assessment & Plan    1. Dyspnea: On arrival likely multifactorial including chronic CHF, morbid obesity, deconditioning, COPD Also with anemia in the setting of renal failure --Right heart catheterization today with normal right heart pressures -Transition back to Lasix 20 mg daily Will take Lasix 40 daily for weight 270 pounds or higher === Needs referral to pulmonary for  COPD work-up, consideration of inhalers === Referral to lung Works for COPD, shortness of breath in the setting of morbid obesity Likely very deconditioned leading to his symptoms === He may need referral to nephrology for anemia  2.HFpEF/pulmonary hypertension: Plan as above, Stressed importance of medication compliance Notes indicating he had stopped his Lasix for several days prior to admission as he was " peeing too much"  3.Atypical chest pain: -No further ischemic work-up needed Recent stress test no ischemia Normal ejection fraction on echo  4.Insulin-dependent diabetes: Poor diet, deconditioned Would benefit from aggressive lifestyle modification  5.Morbid obesity with sleep apnea: Sedentary lifestyle, needs to work with lifestyle Center, nutritionist Very deconditioned -Compliance with CPAP recommended  6.CKD stage II with solitary kidney: Needs outpatient follow-up with nephrology  Hypertension Improved numbers,  169C systolic today Continue inpatient medication regiment at discharge  [place] will sign off.   Medication Recommendations:  As above Other recommendations (labs, testing, etc):   Follow up as an outpatient:  F/u in cardiology clinic   Total encounter time more than 25 minutes  Greater than 50% was spent in counseling and coordination of care with the patient   For questions or updates, please contact [place] Please consult [email or web address] for contact info under Cardiology/STEMI.    Signed, Signed, Esmond Plants, MD, Ph.D [facility]

## 2019-03-29 NOTE — Plan of Care (Signed)
  Problem: Education: Goal: Ability to demonstrate management of disease process will improve Outcome: Progressing Goal: Ability to verbalize understanding of medication therapies will improve Outcome: Progressing Goal: Individualized Educational Video(s) Outcome: Progressing   Problem: Activity: Goal: Capacity to carry out activities will improve Outcome: Progressing   Problem: Cardiac: Goal: Ability to achieve and maintain adequate cardiopulmonary perfusion will improve Outcome: Progressing   Problem: Education: Goal: Knowledge of General Education information will improve Description: Including pain rating scale, medication(s)/side effects and non-pharmacologic comfort measures Outcome: Progressing   Problem: Health Behavior/Discharge Planning: Goal: Ability to manage health-related needs will improve Outcome: Progressing   Problem: Clinical Measurements: Goal: Ability to maintain clinical measurements within normal limits will improve Outcome: Progressing Goal: Will remain free from infection Outcome: Progressing Goal: Diagnostic test results will improve Outcome: Progressing Goal: Respiratory complications will improve Outcome: Progressing Goal: Cardiovascular complication will be avoided Outcome: Progressing   Problem: Activity: Goal: Risk for activity intolerance will decrease Outcome: Progressing   Problem: Nutrition: Goal: Adequate nutrition will be maintained Outcome: Progressing   Problem: Coping: Goal: Level of anxiety will decrease Outcome: Progressing   Problem: Elimination: Goal: Will not experience complications related to bowel motility Outcome: Progressing Goal: Will not experience complications related to urinary retention Outcome: Progressing   Problem: Pain Managment: Goal: General experience of comfort will improve Outcome: Progressing   Problem: Safety: Goal: Ability to remain free from injury will improve Outcome: Progressing    Problem: Skin Integrity: Goal: Risk for impaired skin integrity will decrease Outcome: Progressing   Problem: Education: Goal: Understanding of CV disease, CV risk reduction, and recovery process will improve Outcome: Progressing Goal: Individualized Educational Video(s) Outcome: Progressing   Problem: Activity: Goal: Ability to return to baseline activity level will improve Outcome: Progressing   Problem: Cardiovascular: Goal: Ability to achieve and maintain adequate cardiovascular perfusion will improve Outcome: Progressing Goal: Vascular access site(s) Level 0-1 will be maintained Outcome: Progressing   Problem: Health Behavior/Discharge Planning: Goal: Ability to safely manage health-related needs after discharge will improve Outcome: Progressing   

## 2019-03-30 LAB — BASIC METABOLIC PANEL
Anion gap: 10 (ref 5–15)
BUN: 30 mg/dL — ABNORMAL HIGH (ref 8–23)
CO2: 23 mmol/L (ref 22–32)
Calcium: 9.1 mg/dL (ref 8.9–10.3)
Chloride: 110 mmol/L (ref 98–111)
Creatinine, Ser: 1.75 mg/dL — ABNORMAL HIGH (ref 0.61–1.24)
GFR calc Af Amer: 46 mL/min — ABNORMAL LOW (ref >60)
GFR calc non Af Amer: 40 mL/min — ABNORMAL LOW (ref >60)
Glucose, Bld: 155 mg/dL — ABNORMAL HIGH (ref 70–99)
Potassium: 4.2 mmol/L (ref 3.5–5.1)
Sodium: 143 mmol/L (ref 135–145)

## 2019-03-30 LAB — GLUCOSE, CAPILLARY
Glucose-Capillary: 136 mg/dL — ABNORMAL HIGH (ref 70–99)
Glucose-Capillary: 178 mg/dL — ABNORMAL HIGH (ref 70–99)
Glucose-Capillary: 275 mg/dL — ABNORMAL HIGH (ref 70–99)
Glucose-Capillary: 220 mg/dL — ABNORMAL HIGH (ref 70–99)

## 2019-03-30 LAB — HEMOGLOBIN: Hemoglobin: 9.4 g/dL — ABNORMAL LOW (ref 13.0–17.0)

## 2019-03-30 NOTE — Progress Notes (Signed)
Patient ID: Larry Callahan, male   DOB: 1955-09-25, 63 y.o.   MRN: [id number]  [employer] PROGRESS NOTE  Shiheem Corporan [place] SWH:[id number] DOB: 07-14-1955 DOA: 03/26/2019 PCP: [name], NP  HPI/Subjective: Patient reports shortness of breath with exertion but clinically improving  Objective: Vitals:   03/30/19 0455 03/30/19 0720  BP: (!) 161/83 (!) 173/74  Pulse: 73 61  Resp:  17  Temp:  98 F (36.7 C)  SpO2: 94% 99%    Intake/Output Summary (Last 24 hours) at 03/30/2019 1357 Last data filed at 03/30/2019 1022 Gross per 24 hour  Intake 480 ml  Output 2175 ml  Net -1695 ml   Filed Weights   03/28/19 0843 03/29/19 0500 03/30/19 0455  Weight: 122.2 kg 122.1 kg 121.2 kg    ROS: Review of Systems  Constitutional: Negative for chills and fever.  Eyes: Negative for blurred vision.  Respiratory: Positive for shortness of breath. Negative for cough.   Cardiovascular: Negative for chest pain.  Gastrointestinal: Negative for abdominal pain, constipation, diarrhea, nausea and vomiting.  Genitourinary: Negative for dysuria.  Musculoskeletal: Negative for joint pain.  Neurological: Negative for dizziness and headaches.   Exam: Physical Exam  Constitutional: He is oriented to person, place, and time.  HENT:  Nose: No mucosal edema.  Mouth/Throat: No oropharyngeal exudate or posterior oropharyngeal edema.  Eyes: Pupils are equal, round, and reactive to light. Conjunctivae, EOM and lids are normal.  Neck: No JVD present. Carotid bruit is not present. No edema present. No thyroid mass and no thyromegaly present.  Cardiovascular: S1 normal and S2 normal. Exam reveals no gallop.  No murmur heard. Pulses:      Dorsalis pedis pulses are 2+ on the right side and 2+ on the left side.  Respiratory: No respiratory distress. He has decreased breath sounds in the right lower field and the left lower field. He has no wheezes. He has no rhonchi. He has no rales.   GI: Soft. Bowel sounds are normal. There is no abdominal tenderness.  Musculoskeletal:     Right ankle: He exhibits swelling.     Left ankle: He exhibits swelling.  Lymphadenopathy:    He has no cervical adenopathy.  Neurological: He is alert and oriented to person, place, and time. No cranial nerve deficit.  Skin: Skin is warm. No rash noted. Nails show no clubbing.  Psychiatric: He has a normal mood and affect.      Data Reviewed: Basic Metabolic Panel: Recent Labs  Lab 03/26/19 0958 03/27/19 0443 03/28/19 0559 03/30/19 0618  NA 142 141 142 143  K 3.5 3.3* 3.7 4.2  CL 112* 111 112* 110  CO2 20* 17* 20* 23  GLUCOSE 144* 127* 172* 155*  BUN 22 23 25* 30*  CREATININE 1.69* 1.65* 1.79* 1.75*  CALCIUM 9.7 9.0 8.9 9.1   CBC: Recent Labs  Lab 03/26/19 0958 03/27/19 0443 03/28/19 0559 03/30/19 0618  WBC 9.2 8.3 6.3  --   HGB 10.3* 9.6* 9.1* 9.4*  HCT 33.5* 31.7* 30.5*  --   MCV 83.1 83.4 84.7  --   PLT 304 269 242  --    BNP (last 3 results) Recent Labs    03/01/19 0956 03/14/19 1512 03/26/19 0958  BNP 322.0* 102.0* 184.0*    CBG: Recent Labs  Lab 03/29/19 1353 03/29/19 1633 03/29/19 2059 03/30/19 0722 03/30/19 1130  GLUCAP 101* 192* 268* 136* 178*    Recent Results (from the past 240 hour(s))  SARS CORONAVIRUS 2 (TAT  6-24 HRS) Nasopharyngeal Nasopharyngeal Swab     Status: None   Collection Time: 03/26/19  1:48 PM   Specimen: Nasopharyngeal Swab  Result Value Ref Range Status   SARS Coronavirus 2 NEGATIVE NEGATIVE Final    Comment: (NOTE) SARS-CoV-2 target nucleic acids are NOT DETECTED. The SARS-CoV-2 RNA is generally detectable in upper and lower respiratory specimens during the acute phase of infection. Negative results do not preclude SARS-CoV-2 infection, do not rule out co-infections with other pathogens, and should not be used as the sole basis for treatment or other patient management decisions. Negative results must be combined with  clinical observations, patient history, and epidemiological information. The expected result is Negative. Fact Sheet for Patients: SugarRoll.be Fact Sheet for Healthcare Providers: https://www.woods-mathews.com/ This test is not yet approved or cleared by the [place] FDA and  has been authorized for detection and/or diagnosis of SARS-CoV-2 by FDA under an Emergency Use Authorization (EUA). This EUA will remain  in effect (meaning this test can be used) for the duration of the COVID-19 declaration under Section 56 4(b)(1) of the Act, 21 U.S.C. section 360bbb-3(b)(1), unless the authorization is terminated or revoked sooner. Performed at [facility], [place] [address] [place], [place] [address]      Studies: No results found.  Scheduled Meds: . albuterol  2.5 mg Inhalation TID  . amLODipine  10 mg Oral Daily  . aspirin EC  81 mg Oral Daily  . ferrous gluconate  324 mg Oral Q breakfast  . furosemide  40 mg Oral Daily  . gemfibrozil  600 mg Oral BID WC  . heparin  5,000 Units Subcutaneous Q8H  . hydrALAZINE  100 mg Oral TID  . insulin aspart  0-5 Units Subcutaneous QHS  . insulin aspart  0-9 Units Subcutaneous TID WC  . insulin glargine  12 Units Subcutaneous QPM  . isosorbide mononitrate  30 mg Oral Daily  . loratadine  10 mg Oral Daily  . metoprolol succinate  50 mg Oral QHS  . mometasone-formoterol  2 puff Inhalation BID  . nicotine  21 mg Transdermal Daily  . pantoprazole  40 mg Oral Daily  . sodium chloride flush  3 mL Intravenous Q12H  . tiotropium  18 mcg Inhalation Daily   Continuous Infusions:  Assessment/Plan:  1. Acute on chronic diastolic congestive heart failure.  Case discussed with cardiology and his right heart cath showed normal pressures.  VQ scan negative.  Continue Lasix 40 mg daily.  Check BMP in the morning with his chronic kidney disease. 2. COPD.  Continue inhalers.  Continue to hold off on any  steroids.  This is not COPD exacerbation. 3. Chronic kidney disease stage III.  Patient has 1 kidney and chronic kidney disease.  At 1.75 very close to his baseline 1.7 4. Type 2 diabetes mellitus.  On long-acting insulin and sliding scale. 5. Tobacco abuse on nicotine patch 6. Hypertension continue usual medications 7. Noncompliance with medications reinforced the importance of being compliant with his Lasix.  Patient verbalized understanding.  CHF education provided by the staff  Code Status:     Code Status Orders  (From admission, onward)         Start     Ordered   03/26/19 1532  Full code  Continuous     03/26/19 1532        Code Status History    Date Active Date Inactive Code Status Order ID Comments User Context   03/01/2019 1536 03/05/2019  2055 Full Code [id number]  Loletha Grayer, MD ED   Advance Care Planning Activity     Family Communication: Tried to call the patient's daughter at [phone] but there was no answer Disposition Plan: Likely home tomorrow morning  Consultants:  Cardiology  Time spent: 28 minutes  Illene Silver Amaia Lavallie  [employer]

## 2019-03-31 LAB — GLUCOSE, CAPILLARY
Glucose-Capillary: 324 mg/dL — ABNORMAL HIGH (ref 70–99)
Glucose-Capillary: 304 mg/dL — ABNORMAL HIGH (ref 70–99)

## 2019-03-31 MED ORDER — LORATADINE 10 MG PO TABS: 10.0000 mg | ORAL_TABLET | Freq: Every day | ORAL | Status: DC

## 2019-03-31 MED ORDER — HYDRALAZINE HCL 100 MG PO TABS: 100.0000 mg | ORAL_TABLET | Freq: Three times a day (TID) | ORAL | 1 refills | Status: DC

## 2019-03-31 MED ORDER — INSULIN GLARGINE 100 UNIT/ML ~~LOC~~ SOLN
10.0000 [IU] | Freq: Once | SUBCUTANEOUS | Status: AC
  Administered 2019-03-31: 10 [IU] via SUBCUTANEOUS
  Filled 2019-03-31: qty 0.1

## 2019-03-31 NOTE — Progress Notes (Signed)
Discharged to home with his nephew.  Reviewed his med list with him carefully.  Emphasized that if he takes his meds every day as they are prescribed he will not need to come to the hospital so often.  He needs to make the majority of his follow up appointments

## 2019-03-31 NOTE — Plan of Care (Signed)
  Problem: Education: Goal: Ability to demonstrate management of disease process will improve Outcome: Progressing   Problem: Education: Goal: Knowledge of General Education information will improve Description: Including pain rating scale, medication(s)/side effects and non-pharmacologic comfort measures Outcome: Progressing   Problem: Activity: Goal: Risk for activity intolerance will decrease Outcome: Progressing

## 2019-03-31 NOTE — Discharge Instructions (Signed)
Follow-up with primary care physician in 3 days Follow-up with cardiology -[facility]  in 1 to 2 weeks  Outpatient CHF clinic in 2 days Daily weights, intake and output monitoring Continue oxygen 2 L via nasal cannula as needed and daily CPAP nightly

## 2019-03-31 NOTE — Discharge Summary (Signed)
[facility] NAME: Larry Callahan    MR#:  [id number]  DATE OF BIRTH:  04-Jan-1956  DATE OF ADMISSION:  03/26/2019 ADMITTING PHYSICIAN: Loletha Grayer, MD  DATE OF DISCHARGE:  03/31/19    PRIMARY CARE PHYSICIAN: [name], NP    ADMISSION DIAGNOSIS:  Near syncope [R55] Dyspnea, unspecified type [R06.00] Chest pain, unspecified type [R07.9]  DISCHARGE DIAGNOSIS:  Active Problems:   Shortness of breath   SECONDARY DIAGNOSIS:   Past Medical History:  Diagnosis Date  . Allergy   . Coronary artery disease   . Diabetes mellitus without complication ([place])   . Diastolic CHF ([place])   . GERD (gastroesophageal reflux disease)   . Hyperlipidemia   . Hypertension   . Sleep apnea     HOSPITAL COURSE:  Larry Callahan  is a 64 y.o. male with a known history of CHF presents with shortness of breath and chest pain.  He was recently in the hospital and he felt okay after leaving the hospital.  He has been having shortness of breath for about 4 days.  Patient states that he has been having chest pain today.  Center of his chest.  Goes away pretty easily.  Feels short of breath now.  He is a former smoker.  He states his weight has been going up and down.  He states the swelling of his legs have been better than the last time he was in the hospital.  Hospitalist services were contacted for further evaluation.  Last hospitalization he did have a stress test that was negative.  1. Acute on chronic diastolic congestive heart failure.  Case discussed with cardiology and his right heart cath showed normal pressures.  VQ scan negative.  Cardiology is recommending Lasix 20 mg daily and Lasix 40 mg as needed basis for weight t270 pounds or higher Continue  Reinforced the importance of being compliant with his medications including Lasix.  Check daily weights, monitor intake and output.  Outpatient cardiac rehab  2. COPD.  Continue inhalers.   Continue to hold off on any steroids.  This is not COPD exacerbation. 3. Chronic kidney disease stage III.  Patient has unilateral kidney and chronic kidney disease.    Creatinine at 1.75 very close to his baseline.  Outpatient follow-up with nephrology 4. Type 2 diabetes mellitus.  On long-acting insulin and sliding scale.  Will resume the same.  Patient is noncompliant with the diet, has seen him eating graham crackers and peanut butter 5. Tobacco abuse on nicotine patch 6. Hypertension continue usual medications 7. Noncompliance with medications and diet reinforced the importance of being compliant with his Lasix, other medications as well as diet Patient verbalized understanding.  CHF and diabetic diet education provided by the staff 8. Lifestyle modifications advised  Discharge home.  Continue oxygen via nasal cannula as needed and CPAP nightly DISCHARGE CONDITIONS:   fair  CONSULTS OBTAINED:     PROCEDURES  Rt heart cath , normal right heart pressures  DRUG ALLERGIES:   Allergies  Allergen Reactions  . Penicillins     DISCHARGE MEDICATIONS:   Allergies as of 03/31/2019      Reactions   Penicillins       Medication List    TAKE these medications   acetaminophen 325 MG tablet Commonly known as: TYLENOL Take 2 tablets (650 mg total) by mouth every 4 (four) hours as needed for headache or mild pain.   albuterol 108 (  90 Base) MCG/ACT inhaler Commonly known as: VENTOLIN HFA Inhale 2 puffs into the lungs every 6 (six) hours as needed for wheezing or shortness of breath.   Alcohol Swabs Pads 1 each.   amLODipine 10 MG tablet Commonly known as: NORVASC Take 1 tablet (10 mg total) by mouth daily.   aspirin EC 81 MG tablet Take 81 mg by mouth daily.   clobetasol cream 0.05 % Commonly known as: TEMOVATE Apply 1 application topically 2 (two) times daily.   ferrous gluconate 324 MG tablet Commonly known as: FERGON Take 1 tablet (324 mg total) by mouth daily with  breakfast.   Fluticasone-Salmeterol 250-50 MCG/DOSE Aepb Commonly known as: Advair Diskus Inhale 1 puff into the lungs 2 (two) times daily.   furosemide 20 MG tablet Commonly known as: Lasix Take 20 mg (1 tab) by mouth once a day for weight 269 lb or less.  Take 40 mg (2 tabs) by mouth once a day for weight 270 lb or higher.   gemfibrozil 600 MG tablet Commonly known as: LOPID Take 1 tablet (600 mg total) by mouth 2 (two) times daily with a meal.   glimepiride 2 MG tablet Commonly known as: AMARYL Take 1 tablet (2 mg total) by mouth daily before breakfast.   glucose blood test strip Commonly known as: True Metrix Blood Glucose Test Use as instructed twice daily diag E11.65   hydrALAZINE 100 MG tablet Commonly known as: APRESOLINE Take 1 tablet (100 mg total) by mouth 3 (three) times daily. What changed:   medication strength  how much to take   ipratropium-albuterol 0.5-2.5 (3) MG/3ML Soln Commonly known as: DUONEB Take 3 mLs by nebulization every 6 (six) hours as needed.   isosorbide mononitrate 30 MG 24 hr tablet Commonly known as: IMDUR Take 1 tablet (30 mg total) by mouth daily.   loratadine 10 MG tablet Commonly known as: CLARITIN Take 1 tablet (10 mg total) by mouth daily. Start taking on: April 01, 2019   metoprolol succinate 50 MG 24 hr tablet Commonly known as: TOPROL-XL Take 1 tablet (50 mg total) by mouth at bedtime. Take with or immediately following a meal.   nicotine 21 mg/24hr patch Commonly known as: NICODERM CQ - dosed in mg/24 hours Place 1 patch (21 mg total) onto the skin daily.   NovoFine 32G X 6 MM Misc Generic drug: Insulin Pen Needle To use with [place] injections daily   omeprazole 40 MG capsule Commonly known as: PRILOSEC Take 1 capsule (40 mg total) by mouth daily.   Toujeo SoloStar 300 UNIT/ML Sopn Generic drug: Insulin Glargine (1 Unit Dial) Inject 26 Units into the skin every evening.   triamcinolone ointment 0.1 % Commonly  known as: KENALOG Apply 1 application topically 2 (two) times daily.   True Metrix Air Glucose Meter Devi 1 Device by Does not apply route 2 (two) times daily.   True Metrix Level 1 Low Soln Use as directed   TRUEplus Lancets 33G Misc Use as directed twice daily diag E11.65        DISCHARGE INSTRUCTIONS:   Follow-up with primary care physician in 3 days Follow-up with cardiology -[facility]  in 1 to 2 weeks  Outpatient CHF clinic in 2 days Daily weights, intake and output monitoring Continue oxygen 2 L via nasal cannula as needed and daily CPAP nightly  DIET:  Cardiac diet and Diabetic diet  DISCHARGE CONDITION:  Fair  ACTIVITY:  Activity as tolerated  OXYGEN:  Home Oxygen: Yes.  Oxygen Delivery: 2 liters/min via Patient connected to nasal cannula oxygen As needed DISCHARGE LOCATION:  home   If you experience worsening of your admission symptoms, develop shortness of breath, life threatening emergency, suicidal or homicidal thoughts you must seek medical attention immediately by calling 911 or calling your MD immediately  if symptoms less severe.  You Must read complete instructions/literature along with all the possible adverse reactions/side effects for all the Medicines you take and that have been prescribed to you. Take any new Medicines after you have completely understood and accpet all the possible adverse reactions/side effects.   Please note  You were cared for by a hospitalist during your hospital stay. If you have any questions about your discharge medications or the care you received while you were in the hospital after you are discharged, you can call the unit and asked to speak with the hospitalist on call if the hospitalist that took care of you is not available. Once you are discharged, your primary care physician will handle any further medical issues. Please note that NO REFILLS for any discharge medications will be authorized once you are discharged, as  it is imperative that you return to your primary care physician (or establish a relationship with a primary care physician if you do not have one) for your aftercare needs so that they can reassess your need for medications and monitor your lab values.     Today  Chief Complaint  Patient presents with  . Shortness of Breath  . Chest Pain   Patient is doing fine eating graham crackers and peanut butter, noncompliant with diet, reinforced the importance of being compliant with the diet and dietitian will talk and provide education.  Patient denies any other complaints feeling fine and wants to go home  ROS:  CONSTITUTIONAL: Denies fevers, chills. Denies any fatigue, weakness.  EYES: Denies blurry vision, double vision, eye pain. EARS, NOSE, THROAT: Denies tinnitus, ear pain, hearing loss. RESPIRATORY: Denies cough, wheeze, shortness of breath.  CARDIOVASCULAR: Denies chest pain, palpitations, edema.  GASTROINTESTINAL: Denies nausea, vomiting, diarrhea, abdominal pain. Denies bright red blood per rectum. GENITOURINARY: Denies dysuria, hematuria. ENDOCRINE: Denies nocturia or thyroid problems. HEMATOLOGIC AND LYMPHATIC: Denies easy bruising or bleeding. SKIN: Denies rash or lesion. MUSCULOSKELETAL: Denies pain in neck, back, shoulder, knees, hips or arthritic symptoms.  NEUROLOGIC: Denies paralysis, paresthesias.  PSYCHIATRIC: Denies anxiety or depressive symptoms.   VITAL SIGNS:  Blood pressure 132/64, pulse 70, temperature 98.6 F (37 C), temperature source Oral, resp. rate 18, height 5\' 11"  (1.803 m), weight 120.7 kg, SpO2 96 %.  I/O:    Intake/Output Summary (Last 24 hours) at 03/31/2019 1248 Last data filed at 03/31/2019 1200 Gross per 24 hour  Intake 480 ml  Output 2950 ml  Net -2470 ml    PHYSICAL EXAMINATION:  GENERAL:  63 y.o.-year-old patient lying in the bed with no acute distress.  EYES: Pupils equal, round, reactive to light and accommodation. No scleral  icterus. Extraocular muscles intact.  HEENT: Head atraumatic, normocephalic. Oropharynx and nasopharynx clear.  NECK:  Supple, no jugular venous distention. No thyroid enlargement, no tenderness.  LUNGS: Normal breath sounds bilaterally, no wheezing, rales,rhonchi or crepitation. No use of accessory muscles of respiration.  CARDIOVASCULAR: S1, S2 normal. No murmurs, rubs, or gallops.  ABDOMEN: Soft, non-tender, non-distended. Bowel sounds present. EXTREMITIES: No pedal edema, cyanosis, or clubbing.  NEUROLOGIC: Cranial nerves II through XII are intact. Muscle strength 5/5 in all extremities. Sensation intact. Gait not checked.  PSYCHIATRIC: The patient is alert and oriented x 3.  SKIN: No obvious rash, lesion, or ulcer.   DATA REVIEW:   CBC Recent Labs  Lab 03/28/19 0559 03/30/19 0618  WBC 6.3  --   HGB 9.1* 9.4*  HCT 30.5*  --   PLT 242  --     Chemistries  Recent Labs  Lab 03/30/19 0618  NA 143  K 4.2  CL 110  CO2 23  GLUCOSE 155*  BUN 30*  CREATININE 1.75*  CALCIUM 9.1    Cardiac Enzymes No results for input(s): TROPONINI in the last 168 hours.  Microbiology Results  Results for orders placed or performed during the hospital encounter of 03/26/19  SARS CORONAVIRUS 2 (TAT 6-24 HRS) Nasopharyngeal Nasopharyngeal Swab     Status: None   Collection Time: 03/26/19  1:48 PM   Specimen: Nasopharyngeal Swab  Result Value Ref Range Status   SARS Coronavirus 2 NEGATIVE NEGATIVE Final    Comment: (NOTE) SARS-CoV-2 target nucleic acids are NOT DETECTED. The SARS-CoV-2 RNA is generally detectable in upper and lower respiratory specimens during the acute phase of infection. Negative results do not preclude SARS-CoV-2 infection, do not rule out co-infections with other pathogens, and should not be used as the sole basis for treatment or other patient management decisions. Negative results must be combined with clinical observations, patient history, and epidemiological  information. The expected result is Negative. Fact Sheet for Patients: SugarRoll.be Fact Sheet for Healthcare Providers: https://www.woods-mathews.com/ This test is not yet approved or cleared by the [place] FDA and  has been authorized for detection and/or diagnosis of SARS-CoV-2 by FDA under an Emergency Use Authorization (EUA). This EUA will remain  in effect (meaning this test can be used) for the duration of the COVID-19 declaration under Section 56 4(b)(1) of the Act, 21 U.S.C. section 360bbb-3(b)(1), unless the authorization is terminated or revoked sooner. Performed at [facility], [place] [address], [place], [place] [address]     RADIOLOGY:  No results found.  EKG:   Orders placed or performed during the hospital encounter of 03/26/19  . ED EKG  . ED EKG      Management plans discussed with the patient, family and they are in agreement.  CODE STATUS:     Code Status Orders  (From admission, onward)         Start     Ordered   03/26/19 1532  Full code  Continuous     03/26/19 1532        Code Status History    Date Active Date Inactive Code Status Order ID Comments User Context   03/01/2019 1536 03/05/2019 2055 Full Code [id number]  Loletha Grayer, MD ED   Advance Care Planning Activity      TOTAL TIME TAKING CARE OF THIS PATIENT: 45  minutes.   Note: This dictation was prepared with Dragon dictation along with smaller phrase technology. Any transcriptional errors that result from this process are unintentional.   @[facility] @  on 03/31/2019 at 12:48 PM  Between 7am to 6pm - Pager - [phone]  After 6pm go to [email or web address] - password [email or web address] [facility]  Office  [phone]  CC: Primary care physician; [name], NP

## 2019-03-31 NOTE — Plan of Care (Signed)
Nutrition Education Note  RD consulted for nutrition education regarding CHF and DM.  Met with patient at bedside. He is well-known to our service and has received multiple diet educations. Patient reports he keeps "eating the wrong things." However, then when RD reviewed dietary recall patient was hesitant to report what he ate. Encouraged patient to discuss what he ate so RD could offer suggestions but he only reports eating meat with side salads at home. Provided education.  RD provided "Low Sodium Nutrition Therapy" handout from the Academy of Nutrition and Dietetics. Reviewed patient's dietary recall. Provided examples on ways to decrease sodium intake in diet. Discouraged intake of processed foods and use of salt shaker. Encouraged fresh fruits and vegetables as well as whole grain sources of carbohydrates to maximize fiber intake.   RD discussed why it is important for patient to adhere to diet recommendations, and emphasized the role of fluids, foods to avoid, and importance of weighing self daily. Teach back method used.  RD provided "Carbohydrate Counting for People with Diabetes" handout from the Academy of Nutrition and Dietetics. Discussed different food groups and their effects on blood sugar, emphasizing carbohydrate-containing foods. Provided list of carbohydrates and recommended serving sizes of common foods.  Discussed importance of controlled and consistent carbohydrate intake throughout the day. Provided examples of ways to balance meals/snacks and encouraged intake of high-fiber, whole grain complex carbohydrates. Teach back method used.  RD provided "Using Nutrition Labels: Carbohydrates" handout from the Academy of Nutrition and Dietetics. Discussed how to read a nutrition label including looking at serving size and servings per container. Reviewed that there are 15 grams of carbohydrate in one carbohydrate choice. Encouraged patient to use chart for range of carbohydrate grams  per choice when reading nutrition labels.  Expect poor to fair compliance.  Body mass index is 37.1 kg/m. Pt meets criteria for obesity class II based on current BMI.  Current diet order is heart healthy/carbohydrate modified, patient is consuming approximately 100% of meals at this time. Labs and medications reviewed. No further nutrition interventions warranted at this time. RD contact information provided. If additional nutrition issues arise, please re-consult RD.   Willey Blade, MS, [place], LDN Office: [phone] Pager: [phone] After Hours/Weekend Pager: [phone]

## 2019-04-01 ENCOUNTER — Encounter: Payer: Self-pay | Admitting: Cardiovascular Disease

## 2019-04-08 ENCOUNTER — Telehealth: Payer: Self-pay | Admitting: Cardiovascular Disease

## 2019-04-08 NOTE — Telephone Encounter (Signed)
Patient sister states pt is unable to swallow due to "stuff in his throat" States he usually is able to cough up the phlem, but unable to get it up .

## 2019-04-08 NOTE — Telephone Encounter (Signed)
Patient calling to check on status  If unable to reach at phone number listed call [name] at [phone] - he should be with her

## 2019-04-09 ENCOUNTER — Emergency Department: Payer: Medicare PPO

## 2019-04-09 ENCOUNTER — Encounter: Payer: Self-pay | Admitting: Emergency Medicine

## 2019-04-09 ENCOUNTER — Emergency Department
Admission: EM | Admit: 2019-04-09 | Discharge: 2019-04-09 | Disposition: A | Discharge status: Home / Self Care | Payer: Medicare PPO | Source: Home / Self Care | Attending: Student in an Organized Health Care Education/Training Program | Admitting: Student in an Organized Health Care Education/Training Program

## 2019-04-09 ENCOUNTER — Encounter: Payer: Self-pay | Admitting: Nurse Practitioner

## 2019-04-09 ENCOUNTER — Ambulatory Visit (INDEPENDENT_AMBULATORY_CARE_PROVIDER_SITE_OTHER): Payer: Medicare PPO | Admitting: Nurse Practitioner

## 2019-04-09 ENCOUNTER — Other Ambulatory Visit: Payer: Self-pay

## 2019-04-09 VITALS — BP 149/70 | HR 78 | Temp 98.3°F | Resp 16 | Ht 71.0 in | Wt 266.8 lb

## 2019-04-09 DIAGNOSIS — E1165 Type 2 diabetes mellitus with hyperglycemia: Secondary | ICD-10-CM

## 2019-04-09 DIAGNOSIS — R059 Cough, unspecified: Secondary | ICD-10-CM

## 2019-04-09 DIAGNOSIS — I5032 Chronic diastolic (congestive) heart failure: Secondary | ICD-10-CM | POA: Insufficient documentation

## 2019-04-09 DIAGNOSIS — D509 Iron deficiency anemia, unspecified: Secondary | ICD-10-CM | POA: Diagnosis not present

## 2019-04-09 DIAGNOSIS — Z794 Long term (current) use of insulin: Secondary | ICD-10-CM

## 2019-04-09 DIAGNOSIS — R0602 Shortness of breath: Secondary | ICD-10-CM

## 2019-04-09 DIAGNOSIS — R05 Cough: Secondary | ICD-10-CM

## 2019-04-09 DIAGNOSIS — IMO0002 Reserved for concepts with insufficient information to code with codable children: Secondary | ICD-10-CM

## 2019-04-09 DIAGNOSIS — I5033 Acute on chronic diastolic (congestive) heart failure: Secondary | ICD-10-CM | POA: Diagnosis not present

## 2019-04-09 DIAGNOSIS — Z79899 Other long term (current) drug therapy: Secondary | ICD-10-CM | POA: Insufficient documentation

## 2019-04-09 DIAGNOSIS — Z87891 Personal history of nicotine dependence: Secondary | ICD-10-CM | POA: Insufficient documentation

## 2019-04-09 DIAGNOSIS — I110 Hypertensive heart disease with heart failure: Secondary | ICD-10-CM | POA: Insufficient documentation

## 2019-04-09 DIAGNOSIS — E119 Type 2 diabetes mellitus without complications: Secondary | ICD-10-CM | POA: Insufficient documentation

## 2019-04-09 DIAGNOSIS — Z20828 Contact with and (suspected) exposure to other viral communicable diseases: Secondary | ICD-10-CM | POA: Diagnosis not present

## 2019-04-09 DIAGNOSIS — I2510 Atherosclerotic heart disease of native coronary artery without angina pectoris: Secondary | ICD-10-CM | POA: Insufficient documentation

## 2019-04-09 DIAGNOSIS — J449 Chronic obstructive pulmonary disease, unspecified: Secondary | ICD-10-CM | POA: Insufficient documentation

## 2019-04-09 DIAGNOSIS — I509 Heart failure, unspecified: Secondary | ICD-10-CM | POA: Diagnosis not present

## 2019-04-09 DIAGNOSIS — R042 Hemoptysis: Secondary | ICD-10-CM | POA: Diagnosis not present

## 2019-04-09 DIAGNOSIS — I130 Hypertensive heart and chronic kidney disease with heart failure and stage 1 through stage 4 chronic kidney disease, or unspecified chronic kidney disease: Secondary | ICD-10-CM | POA: Diagnosis not present

## 2019-04-09 DIAGNOSIS — Z7982 Long term (current) use of aspirin: Secondary | ICD-10-CM | POA: Insufficient documentation

## 2019-04-09 HISTORY — DX: Disorder of kidney and ureter, unspecified: N28.9

## 2019-04-09 LAB — BASIC METABOLIC PANEL
Anion gap: 11 (ref 5–15)
BUN: 30 mg/dL — ABNORMAL HIGH (ref 8–23)
CO2: 20 mmol/L — ABNORMAL LOW (ref 22–32)
Calcium: 9.2 mg/dL (ref 8.9–10.3)
Chloride: 110 mmol/L (ref 98–111)
Creatinine, Ser: 2.01 mg/dL — ABNORMAL HIGH (ref 0.61–1.24)
GFR calc Af Amer: 39 mL/min — ABNORMAL LOW (ref >60)
GFR calc non Af Amer: 34 mL/min — ABNORMAL LOW (ref >60)
Glucose, Bld: 288 mg/dL — ABNORMAL HIGH (ref 70–99)
Potassium: 3.8 mmol/L (ref 3.5–5.1)
Sodium: 141 mmol/L (ref 135–145)

## 2019-04-09 LAB — CBC
HCT: 30.4 % — ABNORMAL LOW (ref 39.0–52.0)
Hemoglobin: 9.3 g/dL — ABNORMAL LOW (ref 13.0–17.0)
MCH: 25.9 pg — ABNORMAL LOW (ref 26.0–34.0)
MCHC: 30.6 g/dL (ref 30.0–36.0)
MCV: 84.7 fL (ref 80.0–100.0)
Platelets: 256 10*3/uL (ref 150–400)
RBC: 3.59 MIL/uL — ABNORMAL LOW (ref 4.22–5.81)
RDW: 16.5 % — ABNORMAL HIGH (ref 11.5–15.5)
WBC: 8.5 10*3/uL (ref 4.0–10.5)
nRBC: 0 % (ref 0.0–0.2)

## 2019-04-09 LAB — TROPONIN I (HIGH SENSITIVITY)
Troponin I (High Sensitivity): 14 ng/L (ref <18)
Troponin I (High Sensitivity): 14 ng/L (ref <18)

## 2019-04-09 LAB — BRAIN NATRIURETIC PEPTIDE: B Natriuretic Peptide: 115 pg/mL — ABNORMAL HIGH (ref 0.0–100.0)

## 2019-04-09 MED ORDER — ALBUTEROL SULFATE HFA 108 (90 BASE) MCG/ACT IN AERS: 2.0000 | INHALATION_SPRAY | Freq: Once | RESPIRATORY_TRACT | Status: DC

## 2019-04-09 MED ORDER — TECHNETIUM TO 99M ALBUMIN AGGREGATED
4.0000 | Freq: Once | INTRAVENOUS | Status: AC | PRN
  Administered 2019-04-09: 16:00:00 4.03 via INTRAVENOUS

## 2019-04-09 MED ORDER — ALBUTEROL SULFATE (2.5 MG/3ML) 0.083% IN NEBU: 2.5000 mg | INHALATION_SOLUTION | RESPIRATORY_TRACT | Status: DC | PRN

## 2019-04-09 MED ORDER — TECHNETIUM TC 99M DIETHYLENETRIAME-PENTAACETIC ACID
30.0000 | Freq: Once | INTRAVENOUS | Status: AC | PRN
  Administered 2019-04-09: 30.1 via INTRAVENOUS

## 2019-04-09 MED ORDER — ALBUTEROL SULFATE HFA 108 (90 BASE) MCG/ACT IN AERS
1.0000 | INHALATION_SPRAY | RESPIRATORY_TRACT | Status: DC | PRN
  Administered 2019-04-09: 2 via RESPIRATORY_TRACT
  Filled 2019-04-09: qty 6.7

## 2019-04-09 NOTE — ED Triage Notes (Signed)
Pt reports was told by his MD to come to the ED because he is spitting up blood and has been since yesterday. Pt reports also feel SOB. Pt denies pain and reports blood is bright red.

## 2019-04-09 NOTE — ED Notes (Signed)
Patient transported to [facility] 

## 2019-04-09 NOTE — Telephone Encounter (Signed)
Left message for patient to call back. Called number on last message, patient's sister. Patient is not with his sister. Sister stated patient has gone to see his PCP. Left message for patient to call back.

## 2019-04-09 NOTE — Progress Notes (Signed)
[facility] [place], [place] [address]  Internal MEDICINE  Office Visit Note  Patient Name: Larry Callahan  [id number]  [id number]  Date of Service: 04/09/2019  Chief Complaint  Patient presents with  . Diabetes  . Nasal Congestion    has a lot of blood when spitting     The patient is here for follow up visit. Today, he states that he has been coughing up blood. Started yesterday. States that blood is dark red in nature. Feels as though he has something caught in his throat and unable to get it out. He is already moderately anemic. Most recent check of Hgb is 9.4. he has appointment with Dr. Vicente Males, GI specialist, but this is not until 04/24/2019. He reports shortness of breath, especially with exertion. He is able to speak in complete sentences without too much difficulty.       Current Medication: Outpatient Encounter Medications as of 04/09/2019  Medication Sig  . acetaminophen (TYLENOL) 325 MG tablet Take 2 tablets (650 mg total) by mouth every 4 (four) hours as needed for headache or mild pain.  Marland Kitchen albuterol (VENTOLIN HFA) 108 (90 Base) MCG/ACT inhaler Inhale 2 puffs into the lungs every 6 (six) hours as needed for wheezing or shortness of breath.  . Alcohol Swabs PADS 1 each.  Marland Kitchen amLODipine (NORVASC) 10 MG tablet Take 1 tablet (10 mg total) by mouth daily.  Marland Kitchen aspirin EC 81 MG tablet Take 81 mg by mouth daily.  . Blood Glucose Calibration (TRUE METRIX LEVEL 1) Low SOLN Use as directed  . Blood Glucose Monitoring Suppl (TRUE METRIX AIR GLUCOSE METER) DEVI 1 Device by Does not apply route 2 (two) times daily.  . clobetasol cream (TEMOVATE) 7.34 % Apply 1 application topically 2 (two) times daily.  . ferrous gluconate (FERGON) 324 MG tablet Take 1 tablet (324 mg total) by mouth daily with breakfast.  . Fluticasone-Salmeterol (ADVAIR DISKUS) 250-50 MCG/DOSE AEPB Inhale 1 puff into the lungs 2 (two) times daily.  . furosemide (LASIX) 20 MG tablet  Take 20 mg (1 tab) by mouth once a day for weight 269 lb or less.  Take 40 mg (2 tabs) by mouth once a day for weight 270 lb or higher.  Marland Kitchen gemfibrozil (LOPID) 600 MG tablet Take 1 tablet (600 mg total) by mouth 2 (two) times daily with a meal.  . glimepiride (AMARYL) 2 MG tablet Take 1 tablet (2 mg total) by mouth daily before breakfast.  . glucose blood (TRUE METRIX BLOOD GLUCOSE TEST) test strip Use as instructed twice daily diag E11.65  . hydrALAZINE (APRESOLINE) 100 MG tablet Take 1 tablet (100 mg total) by mouth 3 (three) times daily.  . Insulin Glargine, 1 Unit Dial, (TOUJEO SOLOSTAR) 300 UNIT/ML SOPN Inject 26 Units into the skin every evening.  Marland Kitchen ipratropium-albuterol (DUONEB) 0.5-2.5 (3) MG/3ML SOLN Take 3 mLs by nebulization every 6 (six) hours as needed.  . isosorbide mononitrate (IMDUR) 30 MG 24 hr tablet Take 1 tablet (30 mg total) by mouth daily.  Marland Kitchen loratadine (CLARITIN) 10 MG tablet Take 1 tablet (10 mg total) by mouth daily.  . metoprolol succinate (TOPROL-XL) 50 MG 24 hr tablet Take 1 tablet (50 mg total) by mouth at bedtime. Take with or immediately following a meal.  . nicotine (NICODERM CQ - DOSED IN MG/24 HOURS) 21 mg/24hr patch Place 1 patch (21 mg total) onto the skin daily.  Marland Kitchen NOVOFINE 32G X 6 MM MISC To use with [place]  injections daily  . omeprazole (PRILOSEC) 40 MG capsule Take 1 capsule (40 mg total) by mouth daily.  Marland Kitchen triamcinolone ointment (KENALOG) 0.1 % Apply 1 application topically 2 (two) times daily.  . TRUEPLUS LANCETS 33G MISC Use as directed twice daily diag E11.65   No facility-administered encounter medications on file as of 04/09/2019.     Surgical History: Past Surgical History:  Procedure Laterality Date  . BACK SURGERY    . CHOLECYSTECTOMY    . left arm surgery    . nephrectomy Left   . PARATHYROIDECTOMY    . RIGHT HEART CATH N/A 03/29/2019   Procedure: RIGHT HEART CATH;  Surgeon: [name], MD;  Location: [facility];  Service:  Cardiovascular;  Laterality: N/A;    Medical History: Past Medical History:  Diagnosis Date  . Allergy   . Coronary artery disease   . Diabetes mellitus without complication ([place])   . Diastolic CHF ([place])   . GERD (gastroesophageal reflux disease)   . Hyperlipidemia   . Hypertension   . Sleep apnea     Family History: Family History  Problem Relation Age of Onset  . Diabetes Mother   . Lung cancer Father   . Diabetes Sister   . Hypertension Sister   . Heart disease Sister   . Cancer Sister   . Bone cancer Brother     Social History   Socioeconomic History  . Marital status: Single    Spouse name: Not on file  . Number of children: Not on file  . Years of education: Not on file  . Highest education level: Not on file  Occupational History  . Not on file  Social Needs  . Financial resource strain: Not on file  . Food insecurity    Worry: Not on file    Inability: Not on file  . Transportation needs    Medical: Not on file    Non-medical: Not on file  Tobacco Use  . Smoking status: Former Research scientist (life sciences)  . Smokeless tobacco: Former Systems developer    Types: Chew  Substance and Sexual Activity  . Alcohol use: No    Frequency: Never  . Drug use: No  . Sexual activity: Not on file  Lifestyle  . Physical activity    Days per week: Not on file    Minutes per session: Not on file  . Stress: Not on file  Relationships  . Social Herbalist on phone: Not on file    Gets together: Not on file    Attends religious service: Not on file    Active member of club or organization: Not on file    Attends meetings of clubs or organizations: Not on file    Relationship status: Not on file  . Intimate partner violence    Fear of current or ex partner: Not on file    Emotionally abused: Not on file    Physically abused: Not on file    Forced sexual activity: Not on file  Other Topics Concern  . Not on file  Social History Narrative  . Not on file      Review of Systems   Constitutional: Positive for activity change and fatigue. Negative for chills and unexpected weight change.  HENT: Negative for congestion, postnasal drip, rhinorrhea, sneezing and sore throat.   Respiratory: Positive for cough, choking and shortness of breath. Negative for chest tightness.   Cardiovascular: Negative for chest pain and palpitations.  Gastrointestinal:  Positive for vomiting. Negative for abdominal pain, constipation, diarrhea and nausea.       Vomiting/spitting up blood, dark red in color. Started yesterday.   Endocrine: Negative for cold intolerance, heat intolerance, polydipsia and polyuria.       Blood sugar log indicates that sugars are generally in the 120s to 140s.   Musculoskeletal: Negative for arthralgias, back pain, joint swelling and neck pain.  Skin: Negative for rash.  Neurological: Negative for dizziness, tremors, numbness and headaches.  Hematological: Negative for adenopathy. Does not bruise/bleed easily.  Psychiatric/Behavioral: Negative for behavioral problems (Depression), sleep disturbance and suicidal ideas. The patient is nervous/anxious.     Vital Signs: BP (!) 149/70   Pulse 78   Temp 98.3 F (36.8 C)   Resp 16   Ht 5\' 11"  (1.803 m)   Wt 266 lb 12.8 oz (121 kg)   SpO2 97%   BMI 37.21 kg/m    Physical Exam Vitals signs and nursing note reviewed.  Constitutional:      General: He is not in acute distress.    Appearance: Normal appearance. He is well-developed. He is obese. He is not diaphoretic.  HENT:     Head: Normocephalic and atraumatic.     Mouth/Throat:     Pharynx: No oropharyngeal exudate.  Eyes:     Pupils: Pupils are equal, round, and reactive to light.  Neck:     Musculoskeletal: Normal range of motion and neck supple.     Thyroid: No thyromegaly.     Vascular: No carotid bruit or JVD.     Trachea: No tracheal deviation.  Cardiovascular:     Rate and Rhythm: Normal rate and regular rhythm.     Heart sounds: Normal heart  sounds. No murmur. No friction rub. No gallop.   Pulmonary:     Effort: Pulmonary effort is normal. No respiratory distress.     Breath sounds: Normal breath sounds. No wheezing or rales.  Chest:     Chest wall: No tenderness.  Abdominal:     General: Bowel sounds are normal. There is distension.     Palpations: Abdomen is soft.  Musculoskeletal: Normal range of motion.  Lymphadenopathy:     Cervical: No cervical adenopathy.  Skin:    General: Skin is warm and dry.  Neurological:     Mental Status: He is alert and oriented to person, place, and time. Mental status is at baseline.     Cranial Nerves: No cranial nerve deficit.  Psychiatric:        Behavior: Behavior normal.        Thought Content: Thought content normal.        Judgment: Judgment normal.    Assessment/Plan: 1. Hemoptysis This is new symptom which started yesterday. Advised patient he should be seen in the ER. Already anemic with Hgb at 9.4 at [facility] recent check. Appointment with GI not until 04/24/2019  2. Iron deficiency anemia, unspecified iron deficiency anemia type Advised patient he should be seen in the ER. Already anemic with Hgb at 9.4 at [facility] recent check. Appointment with GI not until 04/24/2019  3. Shortness of breath Potentially related to moderate iron deficiency anemia. Worse due to acute bleeding in upper GI system.   4. Uncontrolled type 2 diabetes mellitus with insulin therapy ([place]) Continue diabetic medication as prescribed   5. Acute on chronic heart failure with preserved ejection fraction (HFpEF) ([place]) Follow up with cardiology as scheduled.   General Counseling: Larry Callahan understanding of  the findings of todays visit and agrees with plan of treatment. I have discussed any further diagnostic evaluation that may be needed or ordered today. We also reviewed his medications today. he has been encouraged to call the office with any questions or concerns that should arise related to todays  visit.   This patient was seen by Leretha Pol FNP Collaboration with Dr Lavera Guise as a part of collaborative care agreement   Time spent: 25 Minutes      Dr Lavera Guise Internal medicine

## 2019-04-09 NOTE — ED Notes (Signed)
Pt ambulated to toilet, tolerated well

## 2019-04-09 NOTE — ED Provider Notes (Signed)
[facility] Emergency Department Provider Note    First MD Initiated Contact with Patient 04/09/19 1403     (approximate)  I have reviewed the triage vital signs and the nursing notes.   HISTORY  Chief Complaint Hemoptysis    HPI Larry Callahan is a 63 y.o. male below his past medical history presents the ER for shortness of breath and hemoptysis over the past 24 hours.  Denies any particular chest pain.  States that when he coughs up he feels like he has a mouthful of blood.  States is very dark.  Does not currently smoke.  Has never been diagnosed with bronchitis he says.  Not currently on any blood thinners.  States his been compliant with his medications.  Denies any melena.  No nausea or vomiting.  No hematemesis.  He is having worsening orthopnea as well as exertional dyspnea    Past Medical History:  Diagnosis Date  . Allergy   . Coronary artery disease   . Diabetes mellitus without complication ([place])   . Diastolic CHF ([place])   . GERD (gastroesophageal reflux disease)   . Hyperlipidemia   . Hypertension   . Renal insufficiency   . Sleep apnea    Family History  Problem Relation Age of Onset  . Diabetes Mother   . Lung cancer Father   . Diabetes Sister   . Hypertension Sister   . Heart disease Sister   . Cancer Sister   . Bone cancer Brother    Past Surgical History:  Procedure Laterality Date  . BACK SURGERY    . CHOLECYSTECTOMY    . left arm surgery    . nephrectomy Left   . PARATHYROIDECTOMY    . RIGHT HEART CATH N/A 03/29/2019   Procedure: RIGHT HEART CATH;  Surgeon: [name], MD;  Location: [facility];  Service: Cardiovascular;  Laterality: N/A;   Patient Active Problem List   Diagnosis Date Noted  . Hemoptysis 04/09/2019  . Shortness of breath 03/26/2019  . Uncontrolled type 2 diabetes mellitus with insulin therapy ([place]) 03/12/2019  . Acute on chronic heart failure with preserved ejection fraction  (HFpEF) ([place])   . Acute kidney injury superimposed on chronic kidney disease ([place])   . CHF (congestive heart failure) ([place]) 03/01/2019  . Diastolic dysfunction 66/29/4765  . Iron deficiency anemia 12/31/2018  . Atopic dermatitis 11/24/2018  . Screening for colon cancer 11/06/2017  . Uncontrolled type 2 diabetes mellitus with hyperglycemia ([place]) 11/06/2017  . Dermatitis, unspecified 06/09/2017  . Atherosclerotic heart disease of native coronary artery without angina pectoris 06/09/2017  . 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 Obstructive sleep apnea, adult 06/09/2017  . Morbid (severe) obesity due to excess calories ([place]) 06/09/2017  . Cigarette nicotine dependence with nicotine-induced disorder 06/09/2017  . Type 2 diabetes mellitus with diabetic polyneuropathy ([place]) 06/09/2017  . Allergic rhinitis due to pollen 06/09/2017  . Mixed hyperlipidemia 06/09/2017  . Chronic obstructive pulmonary disease, unspecified ([place]) 06/09/2017  . Dyspnea on exertion 06/09/2017  . Wheezing 06/09/2017  . Dysuria 06/09/2017  . Snoring 06/09/2017  . Essential hypertension 06/09/2017  . Personal history of other malignant neoplasm of kidney 06/09/2017  . Pain in right hip 06/09/2017  . Impacted cerumen, bilateral 06/09/2017  . Secondary hyperparathyroidism, not elsewhere classified ([place]) 06/09/2017  . Type 2 diabetes mellitus with hyperglycemia ([place]) 06/09/2017  . Tinea corporis 06/09/2017      Prior to Admission medications  Medication Sig Start Date End Date Taking? Authorizing Provider  acetaminophen (TYLENOL) 325 MG tablet Take 2 tablets (650 mg total) by mouth every 4 (four) hours as needed for headache or mild pain. 03/05/19   Gouru, Illene Silver, MD  albuterol (VENTOLIN HFA) 108 (90 Base) MCG/ACT inhaler Inhale 2 puffs into the lungs every 6 (six) hours as needed for wheezing or shortness of breath. 03/05/19   Nicholes Mango, MD  Alcohol Swabs PADS 1 each. 04/04/18    [provider]  amLODipine (NORVASC) 10 MG tablet Take 1 tablet (10 mg total) by mouth daily. 03/12/19   [name], MD  aspirin EC 81 MG tablet Take 81 mg by mouth daily.    [provider]  Blood Glucose Calibration (TRUE METRIX LEVEL 1) Low SOLN Use as directed 04/04/18   [name], NP  Blood Glucose Monitoring Suppl (TRUE METRIX AIR GLUCOSE METER) DEVI 1 Device by Does not apply route 2 (two) times daily. 04/13/18   [name], NP  clobetasol cream (TEMOVATE) 6.64 % Apply 1 application topically 2 (two) times daily. 12/31/18   [name], NP  ferrous gluconate (FERGON) 324 MG tablet Take 1 tablet (324 mg total) by mouth daily with breakfast. 12/31/18   [name], NP  Fluticasone-Salmeterol (ADVAIR DISKUS) 250-50 MCG/DOSE AEPB Inhale 1 puff into the lungs 2 (two) times daily. 03/05/19 03/04/20  Nicholes Mango, MD  furosemide (LASIX) 20 MG tablet Take 20 mg (1 tab) by mouth once a day for weight 269 lb or less.  Take 40 mg (2 tabs) by mouth once a day for weight 270 lb or higher. 03/12/19   [name], MD  gemfibrozil (LOPID) 600 MG tablet Take 1 tablet (600 mg total) by mouth 2 (two) times daily with a meal. 02/28/19   [name], NP  glimepiride (AMARYL) 2 MG tablet Take 1 tablet (2 mg total) by mouth daily before breakfast. 03/13/19   [name], NP  glucose blood (TRUE METRIX BLOOD GLUCOSE TEST) test strip Use as instructed twice daily diag E11.65 11/19/18   [name], NP  hydrALAZINE (APRESOLINE) 100 MG tablet Take 1 tablet (100 mg total) by mouth 3 (three) times daily. 03/31/19   Gouru, Illene Silver, MD  Insulin Glargine, 1 Unit Dial, (TOUJEO SOLOSTAR) 300 UNIT/ML SOPN Inject 26 Units into the skin every evening. 03/13/19 06/11/19  [name], NP  ipratropium-albuterol (DUONEB) 0.5-2.5 (3) MG/3ML SOLN Take 3 mLs by nebulization every 6 (six) hours as needed. 03/08/19   Kendell Bane, NP  isosorbide mononitrate (IMDUR) 30  MG 24 hr tablet Take 1 tablet (30 mg total) by mouth daily. 03/12/19   [name], MD  loratadine (CLARITIN) 10 MG tablet Take 1 tablet (10 mg total) by mouth daily. 04/01/19   Nicholes Mango, MD  metoprolol succinate (TOPROL-XL) 50 MG 24 hr tablet Take 1 tablet (50 mg total) by mouth at bedtime. Take with or immediately following a meal. 03/12/19   Gollan, Kathlene November, MD  nicotine (NICODERM CQ - DOSED IN MG/24 HOURS) 21 mg/24hr patch Place 1 patch (21 mg total) onto the skin daily. 12/31/18   [name], NP  NOVOFINE 32G X 6 MM MISC To use with [place] injections daily 12/13/18   [name], NP  omeprazole (PRILOSEC) 40 MG capsule Take 1 capsule (40 mg total) by mouth daily. 03/01/19   Kendell Bane, NP  triamcinolone ointment (KENALOG) 0.1 % Apply 1  application topically 2 (two) times daily. 11/23/18   [name], NP  TRUEPLUS LANCETS 33G MISC Use as directed twice daily diag E11.65 04/04/18   [name], NP    Allergies Penicillins    Social History Social History   Tobacco Use  . Smoking status: Former Research scientist (life sciences)  . Smokeless tobacco: Former Systems developer    Types: Chew  Substance Use Topics  . Alcohol use: No    Frequency: Never  . Drug use: No    Review of Systems Patient denies headaches, rhinorrhea, blurry vision, numbness, shortness of breath, chest pain, edema, cough, abdominal pain, nausea, vomiting, diarrhea, dysuria, fevers, rashes or hallucinations unless otherwise stated above in HPI. ____________________________________________   PHYSICAL EXAM:  VITAL SIGNS: Vitals:   04/09/19 1221 04/09/19 1423  BP: (!) 155/79 (!) 153/87  Pulse:  76  Resp: 20 18  Temp: 98.5 F (36.9 C)   SpO2: 95% 97%    Constitutional: Alert and oriented.  Eyes: Conjunctivae are normal.  Head: Atraumatic. Nose: No congestion/rhinnorhea. Mouth/Throat: Mucous membranes are moist.  Clear, no bleeding Neck: No stridor. Painless ROM. Cardiovascular: Normal rate, regular  rhythm. Grossly normal heart sounds.  Good peripheral circulation. Respiratory: Normal respiratory effort.  No retractions. Lungs bibasilar crackles, good airmovement.  Speaking in complete sentences Gastrointestinal: Soft and nontender. No distention. No abdominal bruits. No CVA tenderness. Genitourinary:  Musculoskeletal: No lower extremity tenderness.  Trace bLE edema.  No joint effusions. Neurologic:  Normal speech and language. No gross focal neurologic deficits are appreciated. No facial droop Skin:  Skin is warm, dry and intact. No rash noted. Psychiatric: Mood and affect are normal. Speech and behavior are normal.  ____________________________________________   LABS (all labs ordered are listed, but only abnormal results are displayed)  Results for orders placed or performed during the hospital encounter of 04/09/19 (from the past 24 hour(s))  Basic metabolic panel     Status: Abnormal   Collection Time: 04/09/19 12:20 PM  Result Value Ref Range   Sodium 141 135 - 145 mmol/L   Potassium 3.8 3.5 - 5.1 mmol/L   Chloride 110 98 - 111 mmol/L   CO2 20 (L) 22 - 32 mmol/L   Glucose, Bld 288 (H) 70 - 99 mg/dL   BUN 30 (H) 8 - 23 mg/dL   Creatinine, Ser 2.01 (H) 0.61 - 1.24 mg/dL   Calcium 9.2 8.9 - 10.3 mg/dL   GFR calc non Af Amer 34 (L) >60 mL/min   GFR calc Af Amer 39 (L) >60 mL/min   Anion gap 11 5 - 15  CBC     Status: Abnormal   Collection Time: 04/09/19 12:20 PM  Result Value Ref Range   WBC 8.5 4.0 - 10.5 K/uL   RBC 3.59 (L) 4.22 - 5.81 MIL/uL   Hemoglobin 9.3 (L) 13.0 - 17.0 g/dL   HCT 30.4 (L) 39.0 - 52.0 %   MCV 84.7 80.0 - 100.0 fL   MCH 25.9 (L) 26.0 - 34.0 pg   MCHC 30.6 30.0 - 36.0 g/dL   RDW 16.5 (H) 11.5 - 15.5 %   Platelets 256 150 - 400 K/uL   nRBC 0.0 0.0 - 0.2 %  Brain natriuretic peptide     Status: Abnormal   Collection Time: 04/09/19  2:32 PM  Result Value Ref Range   B Natriuretic Peptide 115.0 (H) 0.0 - 100.0 pg/mL  Troponin I (High Sensitivity)      Status: None   Collection Time: 04/09/19  2:32 PM  Result Value Ref Range   Troponin I (High Sensitivity) 14 <18 ng/L  Troponin I (High Sensitivity)     Status: None   Collection Time: 04/09/19  4:46 PM  Result Value Ref Range   Troponin I (High Sensitivity) 14 <18 ng/L   ____________________________________________  EKG My review and personal interpretation at Time: 12:37   Indication: sob  Rate: 80  Rhythm: sinus Axis: normal Other: normal intervals, no stemi ____________________________________________  RADIOLOGY  I personally reviewed all radiographic images ordered to evaluate for the above acute complaints and reviewed radiology reports and findings.  These findings were personally discussed with the patient.  Please see medical record for radiology report.  ____________________________________________   PROCEDURES  Procedure(s) performed:  Procedures    Critical Care performed: no ____________________________________________   INITIAL IMPRESSION / ASSESSMENT AND PLAN / ED COURSE  Pertinent labs & imaging results that were available during my care of the patient were reviewed by me and considered in my medical decision making (see chart for details).   DDX: Asthma, copd, CHF, pna, ptx, malignancy, Pe, anemia   Larry Callahan is a 63 y.o. who presents to the ED with symptoms as described above.  Patient well-appearing in no acute distress.  Has reassuring exam.  Has multiple comorbidities including CHF as well as probable COPD with recent right heart cath.  Is not hypoxic. .The patient will be placed on continuous pulse oximetry and telemetry for monitoring.  Laboratory evaluation will be sent to evaluate for the above complaints.     Clinical Course as of Apr 08 1741  Tue Apr 09, 2019  1447 Unable to obtain CT angiogram due to patient having a solitary kidney with renal insufficiency at this time.  We will see if he is hypoxic.   [PR]  1510 Patient  able to ambulate without any hypoxia.  His hemoglobin appears to be stable from baseline.  He is not having melena.  No nausea or vomiting.  Not have any chest pain at this time.  Troponin is negative.  Will order lower extremity duplex as well as VQ scan as patient unable to tolerate CT scan to exclude PE   [PR]  0962 Patient reassessed.  He remains well.  Denies any chest pain or pressure.  His work-up has been reassuring and at baseline.  No hypoxia or indication for hospitalization.  Patient states that on further questioning he has had some improvement after inhalers.  I do not appreciate any significant wheezing but have encouraged him to increase inhaler use.  States he does need a refill on his inhaler but does have nebulizers at home.  Believe that his symptoms are most likely related bronchitis.  Will encourage increase use of nebs and inhaler.  Has not had any hemoptysis since in the ER.  No evidence of PE.  No evidence of acute blood loss anemia.  Do believe he is appropriate for close outpatient follow-up.   [PR]    Clinical Course User Index [PR] Merlyn Lot, MD    The patient was evaluated in Emergency Department today for the symptoms described in the history of present illness. He/she was evaluated in the context of the global COVID-19 pandemic, which necessitated consideration that the patient might be at risk for infection with the SARS-CoV-2 virus that causes COVID-19. Institutional protocols and algorithms that pertain to the evaluation of patients at risk for COVID-19 are in a state of rapid change based on information released by  regulatory bodies including the CDC and federal and state organizations. These policies and algorithms were followed during the patient's care in the ED.  As part of my medical decision making, I reviewed the following data within the [place] notes reviewed and incorporated, Labs reviewed, notes from prior ED visits and [place]  Controlled Substance Database   ____________________________________________   FINAL CLINICAL IMPRESSION(S) / ED DIAGNOSES  Final diagnoses:  Cough  SOB (shortness of breath)      NEW MEDICATIONS STARTED DURING THIS VISIT:  New Prescriptions   No medications on file     Note:  This document was prepared using Dragon voice recognition software and may include unintentional dictation errors.    Merlyn Lot, MD 04/09/19 [id number]

## 2019-04-09 NOTE — ED Notes (Signed)
Pulse oximetry while ambulating remained 97-98% room air

## 2019-04-10 ENCOUNTER — Emergency Department: Payer: Medicare PPO

## 2019-04-10 ENCOUNTER — Telehealth: Payer: Self-pay

## 2019-04-10 ENCOUNTER — Encounter: Payer: Self-pay | Admitting: Emergency Medicine

## 2019-04-10 ENCOUNTER — Other Ambulatory Visit: Payer: Self-pay

## 2019-04-10 ENCOUNTER — Inpatient Hospital Stay
Admission: EM | Admit: 2019-04-10 | Discharge: 2019-04-13 | DRG: 291 | Disposition: A | Discharge status: Home / Self Care | Payer: Medicare PPO | Attending: Internal Medicine | Admitting: Internal Medicine

## 2019-04-10 DIAGNOSIS — Z20828 Contact with and (suspected) exposure to other viral communicable diseases: Secondary | ICD-10-CM | POA: Diagnosis present

## 2019-04-10 DIAGNOSIS — Z7982 Long term (current) use of aspirin: Secondary | ICD-10-CM

## 2019-04-10 DIAGNOSIS — D519 Vitamin B12 deficiency anemia, unspecified: Secondary | ICD-10-CM | POA: Diagnosis present

## 2019-04-10 DIAGNOSIS — J449 Chronic obstructive pulmonary disease, unspecified: Secondary | ICD-10-CM | POA: Diagnosis present

## 2019-04-10 DIAGNOSIS — Z905 Acquired absence of kidney: Secondary | ICD-10-CM

## 2019-04-10 DIAGNOSIS — R0602 Shortness of breath: Secondary | ICD-10-CM

## 2019-04-10 DIAGNOSIS — Z833 Family history of diabetes mellitus: Secondary | ICD-10-CM

## 2019-04-10 DIAGNOSIS — J441 Chronic obstructive pulmonary disease with (acute) exacerbation: Secondary | ICD-10-CM | POA: Diagnosis not present

## 2019-04-10 DIAGNOSIS — I5189 Other ill-defined heart diseases: Secondary | ICD-10-CM

## 2019-04-10 DIAGNOSIS — K2970 Gastritis, unspecified, without bleeding: Secondary | ICD-10-CM | POA: Diagnosis present

## 2019-04-10 DIAGNOSIS — Z880 Allergy status to penicillin: Secondary | ICD-10-CM

## 2019-04-10 DIAGNOSIS — K2211 Ulcer of esophagus with bleeding: Secondary | ICD-10-CM | POA: Diagnosis present

## 2019-04-10 DIAGNOSIS — I5033 Acute on chronic diastolic (congestive) heart failure: Secondary | ICD-10-CM | POA: Diagnosis present

## 2019-04-10 DIAGNOSIS — I509 Heart failure, unspecified: Secondary | ICD-10-CM | POA: Diagnosis not present

## 2019-04-10 DIAGNOSIS — K635 Polyp of colon: Secondary | ICD-10-CM | POA: Diagnosis present

## 2019-04-10 DIAGNOSIS — E1122 Type 2 diabetes mellitus with diabetic chronic kidney disease: Secondary | ICD-10-CM | POA: Diagnosis present

## 2019-04-10 DIAGNOSIS — E1165 Type 2 diabetes mellitus with hyperglycemia: Secondary | ICD-10-CM | POA: Diagnosis present

## 2019-04-10 DIAGNOSIS — D509 Iron deficiency anemia, unspecified: Secondary | ICD-10-CM | POA: Diagnosis not present

## 2019-04-10 DIAGNOSIS — I130 Hypertensive heart and chronic kidney disease with heart failure and stage 1 through stage 4 chronic kidney disease, or unspecified chronic kidney disease: Principal | ICD-10-CM | POA: Diagnosis present

## 2019-04-10 DIAGNOSIS — Z6836 Body mass index (BMI) 36.0-36.9, adult: Secondary | ICD-10-CM

## 2019-04-10 DIAGNOSIS — N183 Chronic kidney disease, stage 3 unspecified: Secondary | ICD-10-CM | POA: Diagnosis present

## 2019-04-10 DIAGNOSIS — K449 Diaphragmatic hernia without obstruction or gangrene: Secondary | ICD-10-CM | POA: Diagnosis present

## 2019-04-10 DIAGNOSIS — F17210 Nicotine dependence, cigarettes, uncomplicated: Secondary | ICD-10-CM | POA: Diagnosis present

## 2019-04-10 DIAGNOSIS — I110 Hypertensive heart disease with heart failure: Secondary | ICD-10-CM | POA: Diagnosis not present

## 2019-04-10 DIAGNOSIS — Z801 Family history of malignant neoplasm of trachea, bronchus and lung: Secondary | ICD-10-CM

## 2019-04-10 DIAGNOSIS — R0609 Other forms of dyspnea: Secondary | ICD-10-CM | POA: Diagnosis present

## 2019-04-10 DIAGNOSIS — Z794 Long term (current) use of insulin: Secondary | ICD-10-CM

## 2019-04-10 DIAGNOSIS — I5032 Chronic diastolic (congestive) heart failure: Secondary | ICD-10-CM | POA: Diagnosis not present

## 2019-04-10 DIAGNOSIS — R042 Hemoptysis: Secondary | ICD-10-CM | POA: Diagnosis present

## 2019-04-10 DIAGNOSIS — Z79899 Other long term (current) drug therapy: Secondary | ICD-10-CM

## 2019-04-10 DIAGNOSIS — Z7951 Long term (current) use of inhaled steroids: Secondary | ICD-10-CM

## 2019-04-10 DIAGNOSIS — G4733 Obstructive sleep apnea (adult) (pediatric): Secondary | ICD-10-CM | POA: Diagnosis present

## 2019-04-10 LAB — CBC
HCT: 30.5 % — ABNORMAL LOW (ref 39.0–52.0)
Hemoglobin: 9.2 g/dL — ABNORMAL LOW (ref 13.0–17.0)
MCH: 25.6 pg — ABNORMAL LOW (ref 26.0–34.0)
MCHC: 30.2 g/dL (ref 30.0–36.0)
MCV: 85 fL (ref 80.0–100.0)
Platelets: 260 10*3/uL (ref 150–400)
RBC: 3.59 MIL/uL — ABNORMAL LOW (ref 4.22–5.81)
RDW: 16.5 % — ABNORMAL HIGH (ref 11.5–15.5)
WBC: 8 10*3/uL (ref 4.0–10.5)
nRBC: 0 % (ref 0.0–0.2)

## 2019-04-10 LAB — BASIC METABOLIC PANEL
Anion gap: 9 (ref 5–15)
BUN: 38 mg/dL — ABNORMAL HIGH (ref 8–23)
CO2: 22 mmol/L (ref 22–32)
Calcium: 8.9 mg/dL (ref 8.9–10.3)
Chloride: 107 mmol/L (ref 98–111)
Creatinine, Ser: 2.5 mg/dL — ABNORMAL HIGH (ref 0.61–1.24)
GFR calc Af Amer: 30 mL/min — ABNORMAL LOW (ref >60)
GFR calc non Af Amer: 26 mL/min — ABNORMAL LOW (ref >60)
Glucose, Bld: 147 mg/dL — ABNORMAL HIGH (ref 70–99)
Potassium: 3.5 mmol/L (ref 3.5–5.1)
Sodium: 138 mmol/L (ref 135–145)

## 2019-04-10 LAB — GLUCOSE, CAPILLARY: Glucose-Capillary: 138 mg/dL — ABNORMAL HIGH (ref 70–99)

## 2019-04-10 LAB — TROPONIN I (HIGH SENSITIVITY): Troponin I (High Sensitivity): 14 ng/L (ref <18)

## 2019-04-10 MED ORDER — ONDANSETRON HCL 4 MG/2ML IJ SOLN: 4.0000 mg | Freq: Four times a day (QID) | INTRAMUSCULAR | Status: DC | PRN

## 2019-04-10 MED ORDER — HYDRALAZINE HCL 50 MG PO TABS
100.0000 mg | ORAL_TABLET | Freq: Three times a day (TID) | ORAL | Status: DC
  Administered 2019-04-10 – 2019-04-13 (×7): 100 mg via ORAL
  Filled 2019-04-10 (×10): qty 2

## 2019-04-10 MED ORDER — MOMETASONE FURO-FORMOTEROL FUM 200-5 MCG/ACT IN AERO: 2.0000 | INHALATION_SPRAY | Freq: Two times a day (BID) | RESPIRATORY_TRACT | Status: DC

## 2019-04-10 MED ORDER — FUROSEMIDE 20 MG PO TABS
20.0000 mg | ORAL_TABLET | Freq: Every day | ORAL | Status: DC
  Filled 2019-04-10: qty 1

## 2019-04-10 MED ORDER — ACETAMINOPHEN 650 MG RE SUPP
650.0000 mg | Freq: Four times a day (QID) | RECTAL | Status: DC | PRN
  Filled 2019-04-10: qty 1

## 2019-04-10 MED ORDER — AMLODIPINE BESYLATE 10 MG PO TABS
10.0000 mg | ORAL_TABLET | Freq: Every day | ORAL | Status: DC
  Administered 2019-04-11 – 2019-04-13 (×3): 10 mg via ORAL
  Filled 2019-04-10: qty 1
  Filled 2019-04-10: qty 2
  Filled 2019-04-10: qty 1

## 2019-04-10 MED ORDER — ACETAMINOPHEN 325 MG PO TABS: 650.0000 mg | ORAL_TABLET | ORAL | Status: DC | PRN

## 2019-04-10 MED ORDER — METHYLPREDNISOLONE SODIUM SUCC 40 MG IJ SOLR
40.0000 mg | Freq: Every day | INTRAMUSCULAR | Status: DC
  Administered 2019-04-10 – 2019-04-13 (×4): 40 mg via INTRAVENOUS
  Filled 2019-04-10 (×5): qty 1

## 2019-04-10 MED ORDER — ONDANSETRON HCL 4 MG PO TABS
4.0000 mg | ORAL_TABLET | Freq: Four times a day (QID) | ORAL | Status: DC | PRN
  Filled 2019-04-10: qty 1

## 2019-04-10 MED ORDER — ALBUTEROL SULFATE HFA 108 (90 BASE) MCG/ACT IN AERS
2.0000 | INHALATION_SPRAY | Freq: Four times a day (QID) | RESPIRATORY_TRACT | Status: DC
  Administered 2019-04-11 – 2019-04-13 (×8): 2 via RESPIRATORY_TRACT
  Filled 2019-04-10: qty 6.7

## 2019-04-10 MED ORDER — LORATADINE 10 MG PO TABS
10.0000 mg | ORAL_TABLET | Freq: Every day | ORAL | Status: DC
  Administered 2019-04-11 – 2019-04-13 (×3): 10 mg via ORAL
  Filled 2019-04-10 (×4): qty 1

## 2019-04-10 MED ORDER — METOPROLOL SUCCINATE ER 50 MG PO TB24
50.0000 mg | ORAL_TABLET | Freq: Every day | ORAL | Status: DC
  Administered 2019-04-11 – 2019-04-12 (×3): 50 mg via ORAL
  Filled 2019-04-10 (×2): qty 2
  Filled 2019-04-10 (×2): qty 1

## 2019-04-10 MED ORDER — INSULIN ASPART 100 UNIT/ML ~~LOC~~ SOLN
0.0000 [IU] | Freq: Every day | SUBCUTANEOUS | Status: DC
  Administered 2019-04-12: 22:00:00 5 [IU] via SUBCUTANEOUS
  Filled 2019-04-10: qty 1
  Filled 2019-04-10: qty 0.05

## 2019-04-10 MED ORDER — PANTOPRAZOLE SODIUM 40 MG IV SOLR
40.0000 mg | Freq: Two times a day (BID) | INTRAVENOUS | Status: DC
  Administered 2019-04-10 – 2019-04-13 (×6): 40 mg via INTRAVENOUS
  Filled 2019-04-10 (×8): qty 40

## 2019-04-10 MED ORDER — INSULIN GLARGINE (1 UNIT DIAL) 300 UNIT/ML ~~LOC~~ SOPN: 13.0000 [IU] | PEN_INJECTOR | Freq: Every evening | SUBCUTANEOUS | Status: DC

## 2019-04-10 MED ORDER — SODIUM CHLORIDE 0.9 % IV SOLN
300.0000 mg | Freq: Once | INTRAVENOUS | Status: AC
  Administered 2019-04-10: 23:00:00 300 mg via INTRAVENOUS
  Filled 2019-04-10: qty 15

## 2019-04-10 MED ORDER — ISOSORBIDE MONONITRATE ER 30 MG PO TB24
30.0000 mg | ORAL_TABLET | Freq: Every day | ORAL | Status: DC
  Filled 2019-04-10: qty 1

## 2019-04-10 MED ORDER — INSULIN ASPART 100 UNIT/ML ~~LOC~~ SOLN
0.0000 [IU] | Freq: Three times a day (TID) | SUBCUTANEOUS | Status: DC
  Administered 2019-04-11: 09:00:00 2 [IU] via SUBCUTANEOUS
  Administered 2019-04-11: 1 [IU] via SUBCUTANEOUS
  Administered 2019-04-11: 3 [IU] via SUBCUTANEOUS
  Administered 2019-04-12: 1 [IU] via SUBCUTANEOUS
  Administered 2019-04-13: 3 [IU] via SUBCUTANEOUS
  Administered 2019-04-13: 2 [IU] via SUBCUTANEOUS
  Filled 2019-04-10 (×4): qty 1
  Filled 2019-04-10: qty 0.09
  Filled 2019-04-10 (×2): qty 1

## 2019-04-10 MED ORDER — FUROSEMIDE 10 MG/ML IJ SOLN
40.0000 mg | Freq: Once | INTRAMUSCULAR | Status: AC
  Administered 2019-04-10: 40 mg via INTRAVENOUS
  Filled 2019-04-10: qty 4

## 2019-04-10 MED ORDER — ACETAMINOPHEN 325 MG PO TABS: 650.0000 mg | ORAL_TABLET | Freq: Four times a day (QID) | ORAL | Status: DC | PRN

## 2019-04-10 NOTE — ED Notes (Addendum)
Pt c/o his SHOB and chest tightness. Troponin added on to labs.  Still speaking in complete sentences.  reports pain started 3 minutes ago

## 2019-04-10 NOTE — Telephone Encounter (Signed)
Attempted to call patient. LMTCB 04/10/2019  Pt in currently in ED as advised by PCP.

## 2019-04-10 NOTE — H&P (Signed)
[employer] [facility] [place] NAME: Larry Callahan    MR#:  [id number]  DATE OF BIRTH:  10-06-1955  DATE OF ADMISSION:  04/10/2019  PRIMARY CARE PHYSICIAN: [name], NP   REQUESTING/REFERRING PHYSICIAN: Dr Merlyn Lot  CHIEF COMPLAINT:   Chief Complaint  Patient presents with  . Shortness of Breath    HISTORY OF PRESENT ILLNESS:  Larry Callahan  is a 63 y.o. male coming back again to the hospital with shortness of breath.  He states he is also bad spitting up blood.  Yesterday he had a mouthful of blood.  No abdominal pain.  He states he also has light blood in the bowel movements.  Previous hospitalizations with chest pain and shortness of breath.  The patient has had a negative stress test and seen by cardiology on previous admissions.  Patient had a VQ scan that was negative yesterday.  Hospitalist services contacted for further evaluation.  Looking back at iron studies he did have a ferritin of 5 which he has iron deficiency anemia.  PAST MEDICAL HISTORY:   Past Medical History:  Diagnosis Date  . Allergy   . Coronary artery disease   . Diabetes mellitus without complication ([place])   . Diastolic CHF ([place])   . GERD (gastroesophageal reflux disease)   . Hyperlipidemia   . Hypertension   . Renal insufficiency   . Sleep apnea     PAST SURGICAL HISTORY:   Past Surgical History:  Procedure Laterality Date  . BACK SURGERY    . CHOLECYSTECTOMY    . left arm surgery    . nephrectomy Left   . PARATHYROIDECTOMY    . RIGHT HEART CATH N/A 03/29/2019   Procedure: RIGHT HEART CATH;  Surgeon: [name], MD;  Location: [facility];  Service: Cardiovascular;  Laterality: N/A;    SOCIAL HISTORY:   Social History   Tobacco Use  . Smoking status: Former Research scientist (life sciences)  . Smokeless tobacco: Former Systems developer    Types: Chew  Substance Use Topics  . Alcohol use: No    Frequency: Never    FAMILY HISTORY:   Family  History  Problem Relation Age of Onset  . Diabetes Mother   . Lung cancer Father   . Diabetes Sister   . Hypertension Sister   . Heart disease Sister   . Cancer Sister   . Bone cancer Brother     DRUG ALLERGIES:   Allergies  Allergen Reactions  . Penicillins     REVIEW OF SYSTEMS:  CONSTITUTIONAL: No fever, chills or sweats.  Positive for fatigue.  EYES: No blurred or double vision.  EARS, NOSE, AND THROAT: No tinnitus or ear pain. No sore throat RESPIRATORY: Positive for cough, shortness of breath.  No wheezing or hemoptysis.  CARDIOVASCULAR: Occasional chest pain.  Some orthopnea, edema.  GASTROINTESTINAL: Positive for nausea.  No vomiting, diarrhea or abdominal pain.  Some blood in bowel movements GENITOURINARY: No dysuria, hematuria.  ENDOCRINE: No polyuria, nocturia,  HEMATOLOGY: History of anemia, no easy bruising or bleeding SKIN: No rash or lesion. MUSCULOSKELETAL: No joint pain or arthritis.   NEUROLOGIC: No tingling, numbness, weakness.  PSYCHIATRY: No anxiety or depression.   MEDICATIONS AT HOME:   Prior to Admission medications   Medication Sig Start Date End Date Taking? Authorizing Provider  amLODipine (NORVASC) 10 MG tablet Take 1 tablet (10 mg total) by mouth daily. 03/12/19  Yes [name], MD  aspirin EC 81  MG tablet Take 81 mg by mouth daily.   Yes [provider]  ferrous gluconate (FERGON) 324 MG tablet Take 1 tablet (324 mg total) by mouth daily with breakfast. 12/31/18  Yes Boscia, Heather E, NP  Fluticasone-Salmeterol (ADVAIR DISKUS) 250-50 MCG/DOSE AEPB Inhale 1 puff into the lungs 2 (two) times daily. 03/05/19 03/04/20 Yes Gouru, Illene Silver, MD  furosemide (LASIX) 20 MG tablet Take 20 mg (1 tab) by mouth once a day for weight 269 lb or less.  Take 40 mg (2 tabs) by mouth once a day for weight 270 lb or higher. 03/12/19  Yes [name], MD  gemfibrozil (LOPID) 600 MG tablet Take 1 tablet (600 mg total) by mouth 2 (two) times daily with a  meal. 02/28/19  Yes Boscia, Greer Ee, NP  glimepiride (AMARYL) 2 MG tablet Take 1 tablet (2 mg total) by mouth daily before breakfast. 03/13/19  Yes Boscia, Heather E, NP  hydrALAZINE (APRESOLINE) 100 MG tablet Take 1 tablet (100 mg total) by mouth 3 (three) times daily. 03/31/19  Yes Gouru, Aruna, MD  Insulin Glargine, 1 Unit Dial, (TOUJEO SOLOSTAR) 300 UNIT/ML SOPN Inject 26 Units into the skin every evening. 03/13/19 06/11/19 Yes Boscia, Greer Ee, NP  isosorbide mononitrate (IMDUR) 30 MG 24 hr tablet Take 1 tablet (30 mg total) by mouth daily. 03/12/19  Yes [name], MD  loratadine (CLARITIN) 10 MG tablet Take 1 tablet (10 mg total) by mouth daily. 04/01/19  Yes Gouru, Illene Silver, MD  metoprolol succinate (TOPROL-XL) 50 MG 24 hr tablet Take 1 tablet (50 mg total) by mouth at bedtime. Take with or immediately following a meal. 03/12/19  Yes Gollan, Kathlene November, MD  omeprazole (PRILOSEC) 40 MG capsule Take 1 capsule (40 mg total) by mouth daily. 03/01/19  Yes Scarboro, Audie Clear, NP  acetaminophen (TYLENOL) 325 MG tablet Take 2 tablets (650 mg total) by mouth every 4 (four) hours as needed for headache or mild pain. 03/05/19   Gouru, Illene Silver, MD  albuterol (VENTOLIN HFA) 108 (90 Base) MCG/ACT inhaler Inhale 2 puffs into the lungs every 6 (six) hours as needed for wheezing or shortness of breath. 03/05/19   Nicholes Mango, MD  Alcohol Swabs PADS 1 each. 04/04/18   [provider]  Blood Glucose Calibration (TRUE METRIX LEVEL 1) Low SOLN Use as directed 04/04/18   [name], NP  Blood Glucose Monitoring Suppl (TRUE METRIX AIR GLUCOSE METER) DEVI 1 Device by Does not apply route 2 (two) times daily. 04/13/18   [name], NP  clobetasol cream (TEMOVATE) 1.94 % Apply 1 application topically 2 (two) times daily. 12/31/18   [name], NP  glucose blood (TRUE METRIX BLOOD GLUCOSE TEST) test strip Use as instructed twice daily diag E11.65 11/19/18   [name], NP   ipratropium-albuterol (DUONEB) 0.5-2.5 (3) MG/3ML SOLN Take 3 mLs by nebulization every 6 (six) hours as needed. 03/08/19   Scarboro, Audie Clear, NP  nicotine (NICODERM CQ - DOSED IN MG/24 HOURS) 21 mg/24hr patch Place 1 patch (21 mg total) onto the skin daily. 12/31/18   [name], NP  NOVOFINE 32G X 6 MM MISC To use with [place] injections daily 12/13/18   [name], NP  triamcinolone ointment (KENALOG) 0.1 % Apply 1 application topically 2 (two) times daily. 11/23/18   [name], NP  TRUEPLUS LANCETS 33G MISC Use as directed twice daily diag E11.65 04/04/18   [name], NP  VITAL SIGNS:  Blood pressure (!) 166/87, pulse 95, temperature 98.6 F (37 C), temperature source Oral, resp. rate 20, height 5\' 11"  (1.803 m), weight 120 kg, SpO2 99 %.  PHYSICAL EXAMINATION:  GENERAL:  63 y.o.-year-old patient lying in the bed with no acute distress.  EYES: Pupils equal, round, reactive to light and accommodation. No scleral icterus. Extraocular muscles intact.  HEENT: Head atraumatic, normocephalic. Oropharynx and nasopharynx clear.  NECK:  Supple, no jugular venous distention. No thyroid enlargement, no tenderness.  LUNGS: Decreased breath sounds bilaterally, no wheezing, rales,rhonchi or crepitation. No use of accessory muscles of respiration.  CARDIOVASCULAR: S1, S2 normal. No murmurs, rubs, or gallops.  ABDOMEN: Soft, nontender, nondistended. Bowel sounds present. No organomegaly or mass.  EXTREMITIES: 2+ pedal edema, no cyanosis, or clubbing.  NEUROLOGIC: Cranial nerves II through XII are intact. Muscle strength 5/5 in all extremities. Sensation intact. Gait not checked.  PSYCHIATRIC: The patient is alert and oriented x 3.  SKIN: No rash, lesion, or ulcer.   LABORATORY PANEL:   CBC Recent Labs  Lab 04/10/19 1707  WBC 8.0  HGB 9.2*  HCT 30.5*  PLT 260    ------------------------------------------------------------------------------------------------------------------  Chemistries  Recent Labs  Lab 04/10/19 1707  NA 138  K 3.5  CL 107  CO2 22  GLUCOSE 147*  BUN 38*  CREATININE 2.50*  CALCIUM 8.9   ------------------------------------------------------------------------------------------------------------------  RADIOLOGY:  Dg Chest 2 View  Result Date: 04/10/2019 CLINICAL DATA:  Shortness of breath EXAM: CHEST - 2 VIEW COMPARISON:  Radiograph 04/09/2019, CT 02/20/2019 FINDINGS: Hazy interstitial opacities with cephalized, indistinct vascularity. Cardiomediastinal contours are stable prior. No pneumothorax or effusion. No acute osseous or soft tissue abnormality. IMPRESSION: Appearance compatible with CHF with mild cardiomegaly and features of interstitial edema. Electronically Signed   By: Lovena Le M.D.   On: 04/10/2019 17:40   Dg Chest 2 View  Result Date: 04/09/2019 CLINICAL DATA:  Shortness of breath, hemoptysis EXAM: CHEST - 2 VIEW COMPARISON:  03/26/2019 FINDINGS: Mild cardiomegaly. Pulmonary vascular congestion with increased interstitial opacities in a perihilar and bibasilar distribution. No pleural effusion. No pneumothorax. IMPRESSION: Mild cardiomegaly with pulmonary vascular congestion suggesting CHF. Bilateral interstitial opacities suggest interstitial edema. Electronically Signed   By: Davina Poke M.D.   On: 04/09/2019 13:12   Nm Pulmonary Vent And Perf (v/q Scan)  Result Date: 04/09/2019 CLINICAL DATA:  Shortness of breath with hemoptysis EXAM: NUCLEAR MEDICINE VENTILATION - PERFUSION LUNG SCAN VIEWS: Anterior, posterior, left lateral, right lateral, RPO, LPO, RAO, LAO-ventilation and perfusion RADIOPHARMACEUTICALS:  30.10 mCi of Tc-9m DTPA aerosol inhalation and 4.03 mCi Tc57m MAA IV COMPARISON:  Chest radiograph April 09, 2019 FINDINGS: Ventilation: Radiotracer uptake is homogeneous and symmetric  bilaterally. No ventilation defects evident. Perfusion: Radiotracer uptake is homogeneous and symmetric bilaterally. No perfusion defects are evident. IMPRESSION: No appreciable ventilation or perfusion defects. Very low probability of pulmonary embolus. Electronically Signed   By: Lowella Grip III M.D.   On: 04/09/2019 16:32   US Venous Img Lower Bilateral  Result Date: 04/09/2019 CLINICAL DATA:  Hemoptysis, shortness of breath EXAM: BILATERAL LOWER EXTREMITY VENOUS DOPPLER ULTRASOUND TECHNIQUE: Gray-scale sonography with compression, as well as color and duplex ultrasound, were performed to evaluate the deep venous system from the level of the common femoral vein through the popliteal and proximal calf veins. COMPARISON:  None FINDINGS: Normal compressibility of the common femoral, superficial femoral, and popliteal veins, as well as the proximal calf veins. No filling defects to suggest DVT on grayscale  or color Doppler imaging. Doppler waveforms show normal direction of venous flow, normal respiratory phasicity and response to augmentation. IMPRESSION: No femoropopliteal and no calf DVT in the visualized calf veins. If clinical symptoms are inconsistent or if there are persistent or worsening symptoms, further imaging (possibly involving the iliac veins) may be warranted. Electronically Signed   By: Lucrezia Europe M.D.   On: 04/09/2019 17:14    EKG:   Interpreted by me.  Normal sinus rhythm 87 bpm, flattening of T waves laterally.  IMPRESSION AND PLAN:   1.  Persistent shortness of breath with history of COPD.  We will give Solu-Medrol and inhalers to try to make his breathing is good as possible.  Patient had a negative stress test and a right-sided cardiac cath did not show any increased pressures.  I do not believe that this is heart related. 2.  Spitting up blood with iron deficiency anemia.  Patient will end up needing a GI work-up.  I do not think this is lung related he had a CT scan of  the chest that was negative for masses last month. 3.  Chronic diastolic congestive heart failure.  ER physician ordered a dose of Lasix.  Continue to monitor. 4.  Acute on chronic kidney injury.  Monitor closely with diuresis. 5.  Type 2 diabetes mellitus.  Half dose insulin overnight and sliding scale hold oral medication 6.  Obesity with a BMI of 36.90.  Weight loss needed    All the records are reviewed and case discussed with ED provider. Management plans discussed with the patient, and he is in agreement.  CODE STATUS: full code  TOTAL TIME TAKING CARE OF THIS PATIENT: 45 minutes.    Loletha Grayer M.D on 04/10/2019 at 8:00 PM  Between 7am to 6pm - Pager - [phone]  After 6pm call admission pager [phone]  [facility]  [phone]  CC: Primary care physician; [name], NP

## 2019-04-10 NOTE — ED Triage Notes (Signed)
Pt in via POV, reports ongoing shortness of breath, reports being seen here last night for same.  NAD noted at this time.

## 2019-04-10 NOTE — ED Provider Notes (Signed)
[facility] Emergency Department Provider Note    First MD Initiated Contact with Patient 04/10/19 1905     (approximate)  I have reviewed the triage vital signs and the nursing notes.   HISTORY  Chief Complaint Shortness of Breath    HPI Itamar Mcgowan is a 63 y.o. male the below listed past medical history presents to the ER for persistent but worsening shortness of breath and exertional dyspnea.  States he still having episodes of hemoptysis at night and orthopnea.  Had some improvement with nebulizer treatments but still feeling like he is having worsening exertional dyspnea.  Denies any chest pain.    Past Medical History:  Diagnosis Date  . Allergy   . Coronary artery disease   . Diabetes mellitus without complication ([place])   . Diastolic CHF ([place])   . GERD (gastroesophageal reflux disease)   . Hyperlipidemia   . Hypertension   . Renal insufficiency   . Sleep apnea    Family History  Problem Relation Age of Onset  . Diabetes Mother   . Lung cancer Father   . Diabetes Sister   . Hypertension Sister   . Heart disease Sister   . Cancer Sister   . Bone cancer Brother    Past Surgical History:  Procedure Laterality Date  . BACK SURGERY    . CHOLECYSTECTOMY    . left arm surgery    . nephrectomy Left   . PARATHYROIDECTOMY    . RIGHT HEART CATH N/A 03/29/2019   Procedure: RIGHT HEART CATH;  Surgeon: [name], MD;  Location: [facility];  Service: Cardiovascular;  Laterality: N/A;   Patient Active Problem List   Diagnosis Date Noted  . Hemoptysis 04/09/2019  . Shortness of breath 03/26/2019  . Uncontrolled type 2 diabetes mellitus with insulin therapy ([place]) 03/12/2019  . Acute on chronic heart failure with preserved ejection fraction (HFpEF) ([place])   . Acute kidney injury superimposed on chronic kidney disease ([place])   . CHF (congestive heart failure) ([place]) 03/01/2019  . Diastolic dysfunction 95/18/8416  .  Iron deficiency anemia 12/31/2018  . Atopic dermatitis 11/24/2018  . Screening for colon cancer 11/06/2017  . Uncontrolled type 2 diabetes mellitus with hyperglycemia ([place]) 11/06/2017  . Dermatitis, unspecified 06/09/2017  . Atherosclerotic heart disease of native coronary artery without angina pectoris 06/09/2017  . 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 Obstructive sleep apnea, adult 06/09/2017  . Morbid (severe) obesity due to excess calories ([place]) 06/09/2017  . Cigarette nicotine dependence with nicotine-induced disorder 06/09/2017  . Type 2 diabetes mellitus with diabetic polyneuropathy ([place]) 06/09/2017  . Allergic rhinitis due to pollen 06/09/2017  . Mixed hyperlipidemia 06/09/2017  . Chronic obstructive pulmonary disease, unspecified ([place]) 06/09/2017  . Dyspnea on exertion 06/09/2017  . Wheezing 06/09/2017  . Dysuria 06/09/2017  . Snoring 06/09/2017  . Essential hypertension 06/09/2017  . Personal history of other malignant neoplasm of kidney 06/09/2017  . Pain in right hip 06/09/2017  . Impacted cerumen, bilateral 06/09/2017  . Secondary hyperparathyroidism, not elsewhere classified ([place]) 06/09/2017  . Type 2 diabetes mellitus with hyperglycemia ([place]) 06/09/2017  . Tinea corporis 06/09/2017      Prior to Admission medications   Medication Sig Start Date End Date Taking? Authorizing Provider  amLODipine (NORVASC) 10 MG tablet Take 1 tablet (10 mg total) by mouth daily. 03/12/19  Yes [name], MD  aspirin EC 81 MG tablet Take 81 mg by  mouth daily.   Yes [provider]  ferrous gluconate (FERGON) 324 MG tablet Take 1 tablet (324 mg total) by mouth daily with breakfast. 12/31/18  Yes Boscia, Heather E, NP  Fluticasone-Salmeterol (ADVAIR DISKUS) 250-50 MCG/DOSE AEPB Inhale 1 puff into the lungs 2 (two) times daily. 03/05/19 03/04/20 Yes Gouru, Illene Silver, MD  furosemide (LASIX) 20 MG tablet Take 20 mg (1 tab) by mouth once a day for  weight 269 lb or less.  Take 40 mg (2 tabs) by mouth once a day for weight 270 lb or higher. 03/12/19  Yes [name], MD  gemfibrozil (LOPID) 600 MG tablet Take 1 tablet (600 mg total) by mouth 2 (two) times daily with a meal. 02/28/19  Yes Boscia, Greer Ee, NP  glimepiride (AMARYL) 2 MG tablet Take 1 tablet (2 mg total) by mouth daily before breakfast. 03/13/19  Yes Boscia, Heather E, NP  hydrALAZINE (APRESOLINE) 100 MG tablet Take 1 tablet (100 mg total) by mouth 3 (three) times daily. 03/31/19  Yes Gouru, Aruna, MD  Insulin Glargine, 1 Unit Dial, (TOUJEO SOLOSTAR) 300 UNIT/ML SOPN Inject 26 Units into the skin every evening. 03/13/19 06/11/19 Yes Boscia, Greer Ee, NP  isosorbide mononitrate (IMDUR) 30 MG 24 hr tablet Take 1 tablet (30 mg total) by mouth daily. 03/12/19  Yes [name], MD  loratadine (CLARITIN) 10 MG tablet Take 1 tablet (10 mg total) by mouth daily. 04/01/19  Yes Gouru, Illene Silver, MD  metoprolol succinate (TOPROL-XL) 50 MG 24 hr tablet Take 1 tablet (50 mg total) by mouth at bedtime. Take with or immediately following a meal. 03/12/19  Yes Gollan, Kathlene November, MD  omeprazole (PRILOSEC) 40 MG capsule Take 1 capsule (40 mg total) by mouth daily. 03/01/19  Yes Scarboro, Audie Clear, NP  acetaminophen (TYLENOL) 325 MG tablet Take 2 tablets (650 mg total) by mouth every 4 (four) hours as needed for headache or mild pain. 03/05/19   Gouru, Illene Silver, MD  albuterol (VENTOLIN HFA) 108 (90 Base) MCG/ACT inhaler Inhale 2 puffs into the lungs every 6 (six) hours as needed for wheezing or shortness of breath. 03/05/19   Nicholes Mango, MD  Alcohol Swabs PADS 1 each. 04/04/18   [provider]  Blood Glucose Calibration (TRUE METRIX LEVEL 1) Low SOLN Use as directed 04/04/18   [name], NP  Blood Glucose Monitoring Suppl (TRUE METRIX AIR GLUCOSE METER) DEVI 1 Device by Does not apply route 2 (two) times daily. 04/13/18   [name], NP  clobetasol cream (TEMOVATE) 5.85 % Apply 1  application topically 2 (two) times daily. 12/31/18   [name], NP  glucose blood (TRUE METRIX BLOOD GLUCOSE TEST) test strip Use as instructed twice daily diag E11.65 11/19/18   [name], NP  ipratropium-albuterol (DUONEB) 0.5-2.5 (3) MG/3ML SOLN Take 3 mLs by nebulization every 6 (six) hours as needed. 03/08/19   Scarboro, Audie Clear, NP  nicotine (NICODERM CQ - DOSED IN MG/24 HOURS) 21 mg/24hr patch Place 1 patch (21 mg total) onto the skin daily. 12/31/18   [name], NP  NOVOFINE 32G X 6 MM MISC To use with Brave injections daily 12/13/18   [name], NP  triamcinolone ointment (KENALOG) 0.1 % Apply 1 application topically 2 (two) times daily. 11/23/18   [name], NP  TRUEPLUS LANCETS 33G MISC Use as directed twice daily diag E11.65 04/04/18   [name], NP    Allergies Penicillins    Social  History Social History   Tobacco Use  . Smoking status: Former Research scientist (life sciences)  . Smokeless tobacco: Former Systems developer    Types: Chew  Substance Use Topics  . Alcohol use: No    Frequency: Never  . Drug use: No    Review of Systems Patient denies headaches, rhinorrhea, blurry vision, numbness, shortness of breath, chest pain, edema, cough, abdominal pain, nausea, vomiting, diarrhea, dysuria, fevers, rashes or hallucinations unless otherwise stated above in HPI. ____________________________________________   PHYSICAL EXAM:  VITAL SIGNS: Vitals:   04/10/19 1839 04/10/19 1914  BP: (!) 166/87   Pulse: 85 95  Resp: 20   Temp:    SpO2: 98% 99%    Constitutional: Alert and oriented.  Eyes: Conjunctivae are normal.  Head: Atraumatic. Nose: No congestion/rhinnorhea. Mouth/Throat: Mucous membranes are moist.   Neck: No stridor. Painless ROM.  Cardiovascular: Normal rate, regular rhythm. Grossly normal heart sounds.  Good peripheral circulation. Respiratory: Normal respiratory effort.  No retractions. Lungs with diminished posterior lung sounds, scatter expiratory  wheeze Gastrointestinal: Soft and nontender. No distention. No abdominal bruits. No CVA tenderness. Genitourinary:  Musculoskeletal: No lower extremity tenderness nor edema.  No joint effusions. Neurologic:  Normal speech and language. No gross focal neurologic deficits are appreciated. No facial droop Skin:  Skin is warm, dry and intact. No rash noted. Psychiatric: Mood and affect are normal. Speech and behavior are normal.  ____________________________________________   LABS (all labs ordered are listed, but only abnormal results are displayed)  Results for orders placed or performed during the hospital encounter of 04/10/19 (from the past 24 hour(s))  Basic metabolic panel     Status: Abnormal   Collection Time: 04/10/19  5:07 PM  Result Value Ref Range   Sodium 138 135 - 145 mmol/L   Potassium 3.5 3.5 - 5.1 mmol/L   Chloride 107 98 - 111 mmol/L   CO2 22 22 - 32 mmol/L   Glucose, Bld 147 (H) 70 - 99 mg/dL   BUN 38 (H) 8 - 23 mg/dL   Creatinine, Ser 2.50 (H) 0.61 - 1.24 mg/dL   Calcium 8.9 8.9 - 10.3 mg/dL   GFR calc non Af Amer 26 (L) >60 mL/min   GFR calc Af Amer 30 (L) >60 mL/min   Anion gap 9 5 - 15  CBC     Status: Abnormal   Collection Time: 04/10/19  5:07 PM  Result Value Ref Range   WBC 8.0 4.0 - 10.5 K/uL   RBC 3.59 (L) 4.22 - 5.81 MIL/uL   Hemoglobin 9.2 (L) 13.0 - 17.0 g/dL   HCT 30.5 (L) 39.0 - 52.0 %   MCV 85.0 80.0 - 100.0 fL   MCH 25.6 (L) 26.0 - 34.0 pg   MCHC 30.2 30.0 - 36.0 g/dL   RDW 16.5 (H) 11.5 - 15.5 %   Platelets 260 150 - 400 K/uL   nRBC 0.0 0.0 - 0.2 %   ____________________________________________  EKG My review and personal interpretation at Time: 18:37   Indication: sob  Rate: 90  Rhythm: sinus Axis: normal Other: normal intervals, no stemi, nonspecific st abn ____________________________________________  RADIOLOGY   ____________________________________________   PROCEDURES  Procedure(s) performed:  Procedures    Critical  Care performed: no ____________________________________________   INITIAL IMPRESSION / ASSESSMENT AND PLAN / ED COURSE  Pertinent labs & imaging results that were available during my care of the patient were reviewed by me and considered in my medical decision making (see chart for details).   DDX:  Asthma, copd, CHF, pna, ptx, malignancy, Pe, anemia   Antwion Carpenter Aigner is a 63 y.o. who presents to the ED with symptoms as described above.  Patient does have evidence of CHF and interstitial edema with worsening renal function and deterioration is clinical status.  Not having any acute hypoxia but at this point I do feel patient will require observation in hospital.  Will give dose of Lasix given edema evaluate for response to therapy.  Have discussed with the patient and available family all diagnostics and treatments performed thus far and all questions were answered to the best of my ability. The patient demonstrates understanding and agreement with plan.      The patient was evaluated in Emergency Department today for the symptoms described in the history of present illness. He/she was evaluated in the context of the global COVID-19 pandemic, which necessitated consideration that the patient might be at risk for infection with the SARS-CoV-2 virus that causes COVID-19. Institutional protocols and algorithms that pertain to the evaluation of patients at risk for COVID-19 are in a state of rapid change based on information released by regulatory bodies including the CDC and federal and state organizations. These policies and algorithms were followed during the patient's care in the ED.  As part of my medical decision making, I reviewed the following data within the [place] notes reviewed and incorporated, Labs reviewed, notes from prior ED visits and [place] Controlled Substance Database   ____________________________________________   FINAL CLINICAL IMPRESSION(S) /  ED DIAGNOSES  Final diagnoses:  SOB (shortness of breath)  Acute on chronic congestive heart failure, unspecified heart failure type ([place])      NEW MEDICATIONS STARTED DURING THIS VISIT:  New Prescriptions   No medications on file     Note:  This document was prepared using Dragon voice recognition software and may include unintentional dictation errors.    Merlyn Lot, MD 04/10/19 2004

## 2019-04-10 NOTE — ED Triage Notes (Signed)
FIRST NURSE NOTE-here for [facility]. sats 96% RA at check in. Pt speaking in complete sentences at this time.

## 2019-04-10 NOTE — Telephone Encounter (Signed)
He was assessed for DVT and PE, but nothing with stomach, esophagus. My suspicion is this is coming from upper GI tract. I agree about need to be evaluated further.

## 2019-04-10 NOTE — Telephone Encounter (Signed)
Patient calling to check on status   Pt c/o Shortness Of Breath: STAT if SOB developed within the last 24 hours or pt is noticeably SOB on the phone  1. Are you currently SOB (can you hear that pt is SOB on the phone)? yes  2. How long have you been experiencing SOB? Not a clear answer, says it started on a Saturday   3. Are you SOB when sitting or when up moving around? All the time   4. Are you currently experiencing any other symptoms? Coughing up blood

## 2019-04-11 ENCOUNTER — Other Ambulatory Visit: Payer: Self-pay

## 2019-04-11 DIAGNOSIS — E782 Mixed hyperlipidemia: Secondary | ICD-10-CM | POA: Diagnosis not present

## 2019-04-11 DIAGNOSIS — D519 Vitamin B12 deficiency anemia, unspecified: Secondary | ICD-10-CM | POA: Diagnosis not present

## 2019-04-11 DIAGNOSIS — K2970 Gastritis, unspecified, without bleeding: Secondary | ICD-10-CM | POA: Diagnosis present

## 2019-04-11 DIAGNOSIS — Z794 Long term (current) use of insulin: Secondary | ICD-10-CM | POA: Diagnosis not present

## 2019-04-11 DIAGNOSIS — D509 Iron deficiency anemia, unspecified: Secondary | ICD-10-CM

## 2019-04-11 DIAGNOSIS — E1122 Type 2 diabetes mellitus with diabetic chronic kidney disease: Secondary | ICD-10-CM | POA: Diagnosis not present

## 2019-04-11 DIAGNOSIS — K635 Polyp of colon: Secondary | ICD-10-CM | POA: Diagnosis not present

## 2019-04-11 DIAGNOSIS — Z7982 Long term (current) use of aspirin: Secondary | ICD-10-CM | POA: Diagnosis not present

## 2019-04-11 DIAGNOSIS — R042 Hemoptysis: Secondary | ICD-10-CM | POA: Diagnosis not present

## 2019-04-11 DIAGNOSIS — J432 Centrilobular emphysema: Secondary | ICD-10-CM | POA: Diagnosis not present

## 2019-04-11 DIAGNOSIS — I110 Hypertensive heart disease with heart failure: Secondary | ICD-10-CM | POA: Diagnosis not present

## 2019-04-11 DIAGNOSIS — D125 Benign neoplasm of sigmoid colon: Secondary | ICD-10-CM | POA: Diagnosis not present

## 2019-04-11 DIAGNOSIS — K449 Diaphragmatic hernia without obstruction or gangrene: Secondary | ICD-10-CM | POA: Diagnosis not present

## 2019-04-11 DIAGNOSIS — Z20828 Contact with and (suspected) exposure to other viral communicable diseases: Secondary | ICD-10-CM | POA: Diagnosis not present

## 2019-04-11 DIAGNOSIS — Z6836 Body mass index (BMI) 36.0-36.9, adult: Secondary | ICD-10-CM | POA: Diagnosis not present

## 2019-04-11 DIAGNOSIS — I130 Hypertensive heart and chronic kidney disease with heart failure and stage 1 through stage 4 chronic kidney disease, or unspecified chronic kidney disease: Secondary | ICD-10-CM | POA: Diagnosis not present

## 2019-04-11 DIAGNOSIS — Z7951 Long term (current) use of inhaled steroids: Secondary | ICD-10-CM | POA: Diagnosis not present

## 2019-04-11 DIAGNOSIS — J441 Chronic obstructive pulmonary disease with (acute) exacerbation: Secondary | ICD-10-CM | POA: Diagnosis not present

## 2019-04-11 DIAGNOSIS — K2211 Ulcer of esophagus with bleeding: Secondary | ICD-10-CM | POA: Diagnosis not present

## 2019-04-11 DIAGNOSIS — K228 Other specified diseases of esophagus: Secondary | ICD-10-CM | POA: Diagnosis not present

## 2019-04-11 DIAGNOSIS — R0602 Shortness of breath: Secondary | ICD-10-CM | POA: Diagnosis not present

## 2019-04-11 DIAGNOSIS — G4733 Obstructive sleep apnea (adult) (pediatric): Secondary | ICD-10-CM | POA: Diagnosis not present

## 2019-04-11 DIAGNOSIS — N183 Chronic kidney disease, stage 3 unspecified: Secondary | ICD-10-CM | POA: Diagnosis not present

## 2019-04-11 DIAGNOSIS — I509 Heart failure, unspecified: Secondary | ICD-10-CM | POA: Diagnosis not present

## 2019-04-11 DIAGNOSIS — I5033 Acute on chronic diastolic (congestive) heart failure: Secondary | ICD-10-CM | POA: Diagnosis not present

## 2019-04-11 DIAGNOSIS — E1165 Type 2 diabetes mellitus with hyperglycemia: Secondary | ICD-10-CM | POA: Diagnosis not present

## 2019-04-11 DIAGNOSIS — F17210 Nicotine dependence, cigarettes, uncomplicated: Secondary | ICD-10-CM | POA: Diagnosis present

## 2019-04-11 DIAGNOSIS — K625 Hemorrhage of anus and rectum: Secondary | ICD-10-CM | POA: Diagnosis not present

## 2019-04-11 DIAGNOSIS — I5032 Chronic diastolic (congestive) heart failure: Secondary | ICD-10-CM | POA: Diagnosis not present

## 2019-04-11 DIAGNOSIS — Z905 Acquired absence of kidney: Secondary | ICD-10-CM | POA: Diagnosis not present

## 2019-04-11 DIAGNOSIS — Z801 Family history of malignant neoplasm of trachea, bronchus and lung: Secondary | ICD-10-CM | POA: Diagnosis not present

## 2019-04-11 DIAGNOSIS — K5730 Diverticulosis of large intestine without perforation or abscess without bleeding: Secondary | ICD-10-CM | POA: Diagnosis not present

## 2019-04-11 DIAGNOSIS — E1142 Type 2 diabetes mellitus with diabetic polyneuropathy: Secondary | ICD-10-CM | POA: Diagnosis not present

## 2019-04-11 DIAGNOSIS — Z833 Family history of diabetes mellitus: Secondary | ICD-10-CM | POA: Diagnosis not present

## 2019-04-11 LAB — CBC
HCT: 32.2 % — ABNORMAL LOW (ref 39.0–52.0)
Hemoglobin: 9.8 g/dL — ABNORMAL LOW (ref 13.0–17.0)
MCH: 25.9 pg — ABNORMAL LOW (ref 26.0–34.0)
MCHC: 30.4 g/dL (ref 30.0–36.0)
MCV: 85 fL (ref 80.0–100.0)
Platelets: 251 10*3/uL (ref 150–400)
RBC: 3.79 MIL/uL — ABNORMAL LOW (ref 4.22–5.81)
RDW: 16.5 % — ABNORMAL HIGH (ref 11.5–15.5)
WBC: 6.9 10*3/uL (ref 4.0–10.5)
nRBC: 0 % (ref 0.0–0.2)

## 2019-04-11 LAB — BASIC METABOLIC PANEL
Anion gap: 14 (ref 5–15)
BUN: 40 mg/dL — ABNORMAL HIGH (ref 8–23)
CO2: 21 mmol/L — ABNORMAL LOW (ref 22–32)
Calcium: 9.4 mg/dL (ref 8.9–10.3)
Chloride: 107 mmol/L (ref 98–111)
Creatinine, Ser: 2.14 mg/dL — ABNORMAL HIGH (ref 0.61–1.24)
GFR calc Af Amer: 36 mL/min — ABNORMAL LOW (ref >60)
GFR calc non Af Amer: 31 mL/min — ABNORMAL LOW (ref >60)
Glucose, Bld: 198 mg/dL — ABNORMAL HIGH (ref 70–99)
Potassium: 3.9 mmol/L (ref 3.5–5.1)
Sodium: 142 mmol/L (ref 135–145)

## 2019-04-11 LAB — VITAMIN B12: Vitamin B-12: 154 pg/mL — ABNORMAL LOW (ref 180–914)

## 2019-04-11 LAB — GLUCOSE, CAPILLARY
Glucose-Capillary: 185 mg/dL — ABNORMAL HIGH (ref 70–99)
Glucose-Capillary: 125 mg/dL — ABNORMAL HIGH (ref 70–99)
Glucose-Capillary: 237 mg/dL — ABNORMAL HIGH (ref 70–99)
Glucose-Capillary: 94 mg/dL (ref 70–99)

## 2019-04-11 LAB — FOLATE: Folate: 12.2 ng/mL (ref >5.9)

## 2019-04-11 LAB — FERRITIN: Ferritin: 12 ng/mL — ABNORMAL LOW (ref 24–336)

## 2019-04-11 LAB — SARS CORONAVIRUS 2 (TAT 6-24 HRS): SARS Coronavirus 2: NEGATIVE

## 2019-04-11 MED ORDER — PEG 3350-KCL-NA BICARB-NACL 420 G PO SOLR
4000.0000 mL | Freq: Once | ORAL | Status: DC
  Filled 2019-04-11: qty 4000

## 2019-04-11 MED ORDER — SODIUM CHLORIDE 0.9% FLUSH
3.0000 mL | Freq: Two times a day (BID) | INTRAVENOUS | Status: DC
  Administered 2019-04-11 – 2019-04-13 (×4): 3 mL via INTRAVENOUS

## 2019-04-11 MED ORDER — MOMETASONE FURO-FORMOTEROL FUM 200-5 MCG/ACT IN AERO
2.0000 | INHALATION_SPRAY | Freq: Two times a day (BID) | RESPIRATORY_TRACT | Status: DC
  Administered 2019-04-11 – 2019-04-13 (×5): 2 via RESPIRATORY_TRACT
  Filled 2019-04-11: qty 8.8

## 2019-04-11 MED ORDER — ORAL CARE MOUTH RINSE: 15.0000 mL | Freq: Two times a day (BID) | OROMUCOSAL | Status: DC

## 2019-04-11 MED ORDER — POLYETHYLENE GLYCOL 3350 17 GM/SCOOP PO POWD
1.0000 | Freq: Once | ORAL | Status: AC
  Administered 2019-04-11: 255 g via ORAL
  Filled 2019-04-11: qty 255

## 2019-04-11 MED ORDER — CYANOCOBALAMIN 1000 MCG/ML IJ SOLN
1000.0000 ug | Freq: Once | INTRAMUSCULAR | Status: AC
  Administered 2019-04-12: 1000 ug via INTRAMUSCULAR
  Filled 2019-04-11 (×2): qty 1

## 2019-04-11 MED ORDER — SODIUM CHLORIDE 0.9 % IV SOLN
510.0000 mg | Freq: Once | INTRAVENOUS | Status: AC
  Administered 2019-04-12: 510 mg via INTRAVENOUS
  Filled 2019-04-11: qty 17

## 2019-04-11 MED ORDER — INSULIN GLARGINE 100 UNIT/ML ~~LOC~~ SOLN
13.0000 [IU] | Freq: Every evening | SUBCUTANEOUS | Status: DC
  Filled 2019-04-11: qty 0.13

## 2019-04-11 MED ORDER — CHLORHEXIDINE GLUCONATE 0.12 % MT SOLN
15.0000 mL | Freq: Two times a day (BID) | OROMUCOSAL | Status: DC
  Administered 2019-04-11 – 2019-04-13 (×3): 15 mL via OROMUCOSAL
  Filled 2019-04-11 (×3): qty 15

## 2019-04-11 MED ORDER — VITAMIN B-12 1000 MCG PO TABS
1000.0000 ug | ORAL_TABLET | Freq: Every day | ORAL | Status: DC
  Administered 2019-04-12 – 2019-04-13 (×2): 1000 ug via ORAL
  Filled 2019-04-11 (×2): qty 1

## 2019-04-11 MED ORDER — ISOSORBIDE MONONITRATE ER 30 MG PO TB24
30.0000 mg | ORAL_TABLET | Freq: Two times a day (BID) | ORAL | Status: DC
  Administered 2019-04-11 – 2019-04-13 (×5): 30 mg via ORAL
  Filled 2019-04-11 (×6): qty 1

## 2019-04-11 MED ORDER — INSULIN GLARGINE 100 UNIT/ML ~~LOC~~ SOLN
20.0000 [IU] | Freq: Every evening | SUBCUTANEOUS | Status: DC
  Administered 2019-04-11 – 2019-04-12 (×2): 20 [IU] via SUBCUTANEOUS
  Filled 2019-04-11 (×3): qty 0.2

## 2019-04-11 MED ORDER — FUROSEMIDE 10 MG/ML IJ SOLN
60.0000 mg | Freq: Once | INTRAMUSCULAR | Status: AC
  Administered 2019-04-11: 60 mg via INTRAVENOUS
  Filled 2019-04-11: qty 6

## 2019-04-11 MED ORDER — MAGNESIUM CITRATE PO SOLN
1.0000 | Freq: Once | ORAL | Status: AC
  Administered 2019-04-11: 1 via ORAL
  Filled 2019-04-11 (×2): qty 296

## 2019-04-11 NOTE — Progress Notes (Signed)
[facility] [place] NAME: Kwane Rohl    MR#:  [id number]  DATE OF BIRTH:  [birth year]  SUBJECTIVE:  CHIEF COMPLAINT:   Chief Complaint  Patient presents with  . Shortness of Breath   Continues to have shortness of breath and orthopnea Hemoptysis has resolved Had one episode of black tarry stool yesterday  REVIEW OF SYSTEMS:    Review of Systems  Constitutional: Positive for malaise/fatigue. Negative for chills and fever.  HENT: Negative for sore throat.   Eyes: Negative for blurred vision, double vision and pain.  Respiratory: Positive for cough and shortness of breath. Negative for hemoptysis and wheezing.   Cardiovascular: Positive for orthopnea. Negative for chest pain, palpitations and leg swelling.  Gastrointestinal: Positive for melena. Negative for abdominal pain, constipation, diarrhea, heartburn, nausea and vomiting.  Genitourinary: Negative for dysuria and hematuria.  Musculoskeletal: Negative for back pain and joint pain.  Skin: Negative for rash.  Neurological: Negative for sensory change, speech change, focal weakness and headaches.  Endo/Heme/Allergies: Does not bruise/bleed easily.  Psychiatric/Behavioral: Negative for depression. The patient is not nervous/anxious.     DRUG ALLERGIES:   Allergies  Allergen Reactions  . Penicillins     VITALS:  Blood pressure (!) 162/91, pulse 71, temperature 98.6 F (37 C), temperature source Oral, resp. rate 14, height 5\' 11"  (1.803 m), weight 120 kg, SpO2 96 %.  PHYSICAL EXAMINATION:   Physical Exam  GENERAL:  63 y.o.-year-old patient lying in the bed with no acute distress.  Morbidly obese EYES: Pupils equal, round, reactive to light and accommodation. No scleral icterus. Extraocular muscles intact.  HEENT: Head atraumatic, normocephalic. Oropharynx and nasopharynx clear.  NECK:  Supple, no jugular venous distention. No thyroid enlargement, no tenderness.  LUNGS:  Bibasilar crackles CARDIOVASCULAR: S1, S2 normal. No murmurs, rubs, or gallops.  ABDOMEN: Soft, nontender, nondistended. Bowel sounds present. No organomegaly or mass.  EXTREMITIES: No cyanosis, clubbing or edema b/l.    NEUROLOGIC: Cranial nerves II through XII are intact. No focal Motor or sensory deficits b/l.   PSYCHIATRIC: The patient is alert and oriented x 3.  SKIN: No obvious rash, lesion, or ulcer.   LABORATORY PANEL:   CBC Recent Labs  Lab 04/11/19 0528  WBC 6.9  HGB 9.8*  HCT 32.2*  PLT 251   ------------------------------------------------------------------------------------------------------------------ Chemistries  Recent Labs  Lab 04/11/19 0528  NA 142  K 3.9  CL 107  CO2 21*  GLUCOSE 198*  BUN 40*  CREATININE 2.14*  CALCIUM 9.4   ------------------------------------------------------------------------------------------------------------------  Cardiac Enzymes No results for input(s): TROPONINI in the last 168 hours. ------------------------------------------------------------------------------------------------------------------  RADIOLOGY:  Dg Chest 2 View  Result Date: 04/10/2019 CLINICAL DATA:  Shortness of breath EXAM: CHEST - 2 VIEW COMPARISON:  Radiograph 04/09/2019, CT 02/20/2019 FINDINGS: Hazy interstitial opacities with cephalized, indistinct vascularity. Cardiomediastinal contours are stable prior. No pneumothorax or effusion. No acute osseous or soft tissue abnormality. IMPRESSION: Appearance compatible with CHF with mild cardiomegaly and features of interstitial edema. Electronically Signed   By: Lovena Le M.D.   On: 04/10/2019 17:40   Dg Chest 2 View  Result Date: 04/09/2019 CLINICAL DATA:  Shortness of breath, hemoptysis EXAM: CHEST - 2 VIEW COMPARISON:  03/26/2019 FINDINGS: Mild cardiomegaly. Pulmonary vascular congestion with increased interstitial opacities in a perihilar and bibasilar distribution. No pleural effusion. No  pneumothorax. IMPRESSION: Mild cardiomegaly with pulmonary vascular congestion suggesting CHF. Bilateral interstitial opacities suggest interstitial edema. Electronically Signed   By: Hart Carwin  Plundo M.D.   On: 04/09/2019 13:12   Nm Pulmonary Vent And Perf (v/q Scan)  Result Date: 04/09/2019 CLINICAL DATA:  Shortness of breath with hemoptysis EXAM: NUCLEAR MEDICINE VENTILATION - PERFUSION LUNG SCAN VIEWS: Anterior, posterior, left lateral, right lateral, RPO, LPO, RAO, LAO-ventilation and perfusion RADIOPHARMACEUTICALS:  30.10 mCi of Tc-87m DTPA aerosol inhalation and 4.03 mCi Tc6m MAA IV COMPARISON:  Chest radiograph April 09, 2019 FINDINGS: Ventilation: Radiotracer uptake is homogeneous and symmetric bilaterally. No ventilation defects evident. Perfusion: Radiotracer uptake is homogeneous and symmetric bilaterally. No perfusion defects are evident. IMPRESSION: No appreciable ventilation or perfusion defects. Very low probability of pulmonary embolus. Electronically Signed   By: Lowella Grip III M.D.   On: 04/09/2019 16:32   US Venous Img Lower Bilateral  Result Date: 04/09/2019 CLINICAL DATA:  Hemoptysis, shortness of breath EXAM: BILATERAL LOWER EXTREMITY VENOUS DOPPLER ULTRASOUND TECHNIQUE: Gray-scale sonography with compression, as well as color and duplex ultrasound, were performed to evaluate the deep venous system from the level of the common femoral vein through the popliteal and proximal calf veins. COMPARISON:  None FINDINGS: Normal compressibility of the common femoral, superficial femoral, and popliteal veins, as well as the proximal calf veins. No filling defects to suggest DVT on grayscale or color Doppler imaging. Doppler waveforms show normal direction of venous flow, normal respiratory phasicity and response to augmentation. IMPRESSION: No femoropopliteal and no calf DVT in the visualized calf veins. If clinical symptoms are inconsistent or if there are persistent or worsening  symptoms, further imaging (possibly involving the iliac veins) may be warranted. Electronically Signed   By: Lucrezia Europe M.D.   On: 04/09/2019 17:14   ASSESSMENT AND PLAN:   * Acute COPD exacerbation -IV steroids, Antibiotics - Scheduled Nebulizers - Inhalers -Wean O2 as tolerated - Consult pulmonary if no improvement  * Acute on chronic diastolic chf - IV Lasix - Input and Output - Counseled to limit fluids and Salt - Monitor Bun/Cr and Potassium - Echo reviewed -Appreciate cardiology input   * Hemoptysis Due to copd. Resolved today  *Iron deficiency anemia with intermittent melena GI consulted for EGD. Monitor hemoglobin and transfuse as needed Received 1 dose of IV iron.  Will need iron supplements at discharge  *CKD stage III.  Slowly worsening.  Monitor while being diuresed  *Diabetes mellitus type 2 Continue home insulin and sliding scale insulin  6.  Obesity with a BMI of 36.90.  Weight loss needed  All the records are reviewed and case discussed with Care Management/Social Worker Management plans discussed with the patient, family and they are in agreement.  CODE STATUS: FULL CODE  DVT Prophylaxis: SCDs  TOTAL TIME TAKING CARE OF THIS PATIENT: 35 minutes.   POSSIBLE D/C IN 1-2 DAYS, DEPENDING ON CLINICAL CONDITION.  Leia Alf Mylei Brackeen M.D on 04/11/2019 at 11:51 AM  Between 7am to 6pm - Pager - [phone]  After 6pm go to [email or web address] - password [email or web address] [facility]  Office  [phone]  CC: Primary care physician; [name], NP  Note: This dictation was prepared with Dragon dictation along with smaller phrase technology. Any transcriptional errors that result from this process are unintentional.

## 2019-04-11 NOTE — ED Notes (Signed)
Pt resting with eyes closed, no distress noted at this time.

## 2019-04-11 NOTE — ED Notes (Signed)
Patient ambulatory to use restroom. Gown and bed linen changed.

## 2019-04-11 NOTE — ED Notes (Signed)
Patient awake and requesting to be taken off of cpap. Mask removed and patient repositioned in bed. Lights on. Remote given to patient.

## 2019-04-11 NOTE — ED Notes (Signed)
Patient given phone to speak with sister. After call, patient ambulatory to bed and back with steady gait and NAD.

## 2019-04-11 NOTE — ED Notes (Addendum)
Report to [employer], Solicitor states they will send transport to take patient.

## 2019-04-11 NOTE — ED Notes (Signed)
Pt resting with cpap in place, no distress noted.

## 2019-04-11 NOTE — ED Notes (Addendum)
PT moved to room with TV and bed adjusted. Pt denies further needs. Awaiting pharmacy verification of rest of night time meds.

## 2019-04-11 NOTE — ED Notes (Signed)
Pt wanting to take a nap, RT called to placed pt on CPAP.  Pt comfortable at this time and denies other needs.

## 2019-04-11 NOTE — Consult Note (Signed)
Cardiology Consultation:   Patient ID: Larry Callahan MRN: [id number]; DOB: 1956/01/26  Admit date: 04/10/2019 Date of Consult: 04/11/2019  Primary Care Provider: [name], NP Primary Cardiologist: [name], MD  Consult requested by Dr. Leslye Peer Reason for consult: Shortness of breath   Patient Profile:   Brason Berthelot Vaillancourt is a 63 y.o. male  morbid obesity,  hypertension poorly controlled,  type 2 diabetes , poorly controlled smoker, chronic renal insufficiency,  single kidney, obstructive sleep apnea onCPAP, Multiple presentations to the ER for shortness of breath  Who presents to the hospital with hemoptysis, referred by primary care  History of Present Illness:   Mr. Toya  Presented to the hospital reports that he is coughing up blood, started 2 days ago.  Feels that he has something stuck in his throat unable to get around.  He does have outpatient appointment with GI November 11 Reported his chronic shortness of breath worse with exertion It was recommended he go to the emergency room for his hemoptysis  In the emergency room reported that he had his chronic shortness of breath  He is compliant with CPAP Feels short of breath sitting around his house in the evenings, he is not napping at the time.  Chronic issue Stop smoking several months ago, started when he was 63 years old  Significant cardiac work-up in the past several months for his chronic shortness of breath  Most recently with right heart catheterization showing low pressures, not consistent with CHF  Echocardiogram March 03, 2019 normal ejection fraction, dilated left atrium,  VQ scan April 09, 2019 very low probability PE No DVT on lower extremity Doppler  left heart catheterization 2011 Nonobstructive disease at that time Nondominant RCA  Chronic renal sufficiency Reports single kidney, previously with surgical resection Poorly controlled diabetes,  poorly controlled hypertension   Heart Pathway Score:     Past Medical History:  Diagnosis Date  . Allergy   . Coronary artery disease   . Diabetes mellitus without complication ([place])   . Diastolic CHF ([place])   . GERD (gastroesophageal reflux disease)   . Hyperlipidemia   . Hypertension   . Renal insufficiency   . Sleep apnea     Past Surgical History:  Procedure Laterality Date  . BACK SURGERY    . CHOLECYSTECTOMY    . left arm surgery    . nephrectomy Left   . PARATHYROIDECTOMY    . RIGHT HEART CATH N/A 03/29/2019   Procedure: RIGHT HEART CATH;  Surgeon: [name], MD;  Location: [facility];  Service: Cardiovascular;  Laterality: N/A;     Home Medications:  Prior to Admission medications   Medication Sig Start Date End Date Taking? Authorizing Provider  amLODipine (NORVASC) 10 MG tablet Take 1 tablet (10 mg total) by mouth daily. 03/12/19  Yes [name], MD  aspirin EC 81 MG tablet Take 81 mg by mouth daily.   Yes [provider]  ferrous gluconate (FERGON) 324 MG tablet Take 1 tablet (324 mg total) by mouth daily with breakfast. 12/31/18  Yes Boscia, Heather E, NP  Fluticasone-Salmeterol (ADVAIR DISKUS) 250-50 MCG/DOSE AEPB Inhale 1 puff into the lungs 2 (two) times daily. 03/05/19 03/04/20 Yes Gouru, Illene Silver, MD  furosemide (LASIX) 20 MG tablet Take 20 mg (1 tab) by mouth once a day for weight 269 lb or less.  Take 40 mg (2 tabs) by mouth once a day for weight 270 lb or higher. 03/12/19  Yes  [name], MD  gemfibrozil (LOPID) 600 MG tablet Take 1 tablet (600 mg total) by mouth 2 (two) times daily with a meal. 02/28/19  Yes Boscia, Greer Ee, NP  glimepiride (AMARYL) 2 MG tablet Take 1 tablet (2 mg total) by mouth daily before breakfast. 03/13/19  Yes Boscia, Heather E, NP  hydrALAZINE (APRESOLINE) 100 MG tablet Take 1 tablet (100 mg total) by mouth 3 (three) times daily. 03/31/19  Yes Gouru, Aruna, MD  Insulin Glargine, 1 Unit Dial, (TOUJEO  SOLOSTAR) 300 UNIT/ML SOPN Inject 26 Units into the skin every evening. 03/13/19 06/11/19 Yes Boscia, Greer Ee, NP  isosorbide mononitrate (IMDUR) 30 MG 24 hr tablet Take 1 tablet (30 mg total) by mouth daily. 03/12/19  Yes [name], MD  loratadine (CLARITIN) 10 MG tablet Take 1 tablet (10 mg total) by mouth daily. 04/01/19  Yes Gouru, Illene Silver, MD  metoprolol succinate (TOPROL-XL) 50 MG 24 hr tablet Take 1 tablet (50 mg total) by mouth at bedtime. Take with or immediately following a meal. 03/12/19  Yes Niesha Bame, Kathlene November, MD  omeprazole (PRILOSEC) 40 MG capsule Take 1 capsule (40 mg total) by mouth daily. 03/01/19  Yes Scarboro, Audie Clear, NP  acetaminophen (TYLENOL) 325 MG tablet Take 2 tablets (650 mg total) by mouth every 4 (four) hours as needed for headache or mild pain. 03/05/19   Gouru, Illene Silver, MD  albuterol (VENTOLIN HFA) 108 (90 Base) MCG/ACT inhaler Inhale 2 puffs into the lungs every 6 (six) hours as needed for wheezing or shortness of breath. 03/05/19   Nicholes Mango, MD  Alcohol Swabs PADS 1 each. 04/04/18   [provider]  Blood Glucose Calibration (TRUE METRIX LEVEL 1) Low SOLN Use as directed 04/04/18   [name], NP  Blood Glucose Monitoring Suppl (TRUE METRIX AIR GLUCOSE METER) DEVI 1 Device by Does not apply route 2 (two) times daily. 04/13/18   [name], NP  clobetasol cream (TEMOVATE) 0.01 % Apply 1 application topically 2 (two) times daily. 12/31/18   [name], NP  glucose blood (TRUE METRIX BLOOD GLUCOSE TEST) test strip Use as instructed twice daily diag E11.65 11/19/18   [name], NP  ipratropium-albuterol (DUONEB) 0.5-2.5 (3) MG/3ML SOLN Take 3 mLs by nebulization every 6 (six) hours as needed. 03/08/19   Scarboro, Audie Clear, NP  nicotine (NICODERM CQ - DOSED IN MG/24 HOURS) 21 mg/24hr patch Place 1 patch (21 mg total) onto the skin daily. 12/31/18   [name], NP  NOVOFINE 32G X 6 MM MISC To use with  injections daily 12/13/18    [name], NP  triamcinolone ointment (KENALOG) 0.1 % Apply 1 application topically 2 (two) times daily. 11/23/18   [name], NP  TRUEPLUS LANCETS 33G MISC Use as directed twice daily diag E11.65 04/04/18   [name], NP    Inpatient Medications: Scheduled Meds: . albuterol  2 puff Inhalation Q6H  . amLODipine  10 mg Oral Daily  . furosemide  60 mg Intravenous Once  . hydrALAZINE  100 mg Oral TID  . insulin aspart  0-5 Units Subcutaneous QHS  . insulin aspart  0-9 Units Subcutaneous TID WC  . insulin glargine  13 Units Subcutaneous QPM  . isosorbide mononitrate  30 mg Oral Daily  . loratadine  10 mg Oral Daily  . methylPREDNISolone (SOLU-MEDROL) injection  40 mg Intravenous Daily  . metoprolol succinate  50 mg Oral QHS  . mometasone-formoterol  2  puff Inhalation BID  . pantoprazole (PROTONIX) IV  40 mg Intravenous Q12H   Continuous Infusions:  PRN Meds: acetaminophen **OR** acetaminophen, ondansetron **OR** ondansetron (ZOFRAN) IV  Allergies:    Allergies  Allergen Reactions  . Penicillins     Social History:   Social History   Socioeconomic History  . Marital status: Single    Spouse name: Not on file  . Number of children: Not on file  . Years of education: Not on file  . Highest education level: Not on file  Occupational History  . Not on file  Social Needs  . Financial resource strain: Not on file  . Food insecurity    Worry: Not on file    Inability: Not on file  . Transportation needs    Medical: Not on file    Non-medical: Not on file  Tobacco Use  . Smoking status: Former Research scientist (life sciences)  . Smokeless tobacco: Former Systems developer    Types: Chew  Substance and Sexual Activity  . Alcohol use: No    Frequency: Never  . Drug use: No  . Sexual activity: Not on file  Lifestyle  . Physical activity    Days per week: Not on file    Minutes per session: Not on file  . Stress: Not on file  Relationships  . Social Herbalist on phone:  Not on file    Gets together: Not on file    Attends religious service: Not on file    Active member of club or organization: Not on file    Attends meetings of clubs or organizations: Not on file    Relationship status: Not on file  . Intimate partner violence    Fear of current or ex partner: Not on file    Emotionally abused: Not on file    Physically abused: Not on file    Forced sexual activity: Not on file  Other Topics Concern  . Not on file  Social History Narrative  . Not on file    Family History:    Family History  Problem Relation Age of Onset  . Diabetes Mother   . Lung cancer Father   . Diabetes Sister   . Hypertension Sister   . Heart disease Sister   . Cancer Sister   . Bone cancer Brother      Review of Systems  Constitutional: Negative.   HENT: Negative.   Respiratory: Positive for shortness of breath.   Cardiovascular: Negative.   Gastrointestinal: Negative.        Hemoptysis  Musculoskeletal: Negative.   Neurological: Negative.   Psychiatric/Behavioral: Negative.   All other systems reviewed and are negative.    Physical Exam/Data:   Vitals:   04/10/19 2316 04/11/19 0403 04/11/19 0410 04/11/19 0724  BP: (!) 176/91 (!) 170/91  136/74  Pulse: 85 93 92 79  Resp: (!) 22 (!) 24 18 20   Temp:      TempSrc:      SpO2: 96% 93% 99% 96%  Weight:      Height:        Intake/Output Summary (Last 24 hours) at 04/11/2019 0848 Last data filed at 04/11/2019 5053 Gross per 24 hour  Intake -  Output 550 ml  Net -550 ml   Last 3 Weights 04/10/2019 04/09/2019 04/09/2019  Weight (lbs) 264 lb 8.8 oz 268 lb 266 lb 12.8 oz  Weight (kg) 120 kg 121.564 kg 121.02 kg  Some encounter information is confidential and restricted.  Go to Review Flowsheets activity to see all data.     Body mass index is 36.9 kg/m.  General:  Well nourished, well developed, in no acute distres HEENT: normal Lymph: no adenopathy Neck: no JVD Endocrine:  No thryomegaly  Vascular: No carotid bruits; FA pulses 2+ bilaterally without bruits  Cardiac:  normal S1, S2; RRR; no murmur  Lungs:  clear to auscultation bilaterally, no wheezing, rhonchi or rales  Abd: soft, nontender, no hepatomegaly  Ext: no edema Musculoskeletal:  No deformities, BUE and BLE strength normal and equal Skin: warm and dry  Neuro:  CNs 2-12 intact, no focal abnormalities noted Psych:  Normal affect   EKG:  The EKG was personally reviewed and demonstrates:   NSR with rate 76 bpm no significant ST-T wave changes  Telemetry:  Telemetry was personally reviewed and demonstrates:  NSR  Relevant CV Studies: As above  Laboratory Data:  High Sensitivity Troponin:   Recent Labs  Lab 03/26/19 0958 03/26/19 1219 04/09/19 1432 04/09/19 1646 04/10/19 1707  TROPONINIHS 17 17 14 14 14      Chemistry Recent Labs  Lab 04/09/19 1220 04/10/19 1707 04/11/19 0528  NA 141 138 142  K 3.8 3.5 3.9  CL 110 107 107  CO2 20* 22 21*  GLUCOSE 288* 147* 198*  BUN 30* 38* 40*  CREATININE 2.01* 2.50* 2.14*  CALCIUM 9.2 8.9 9.4  GFRNONAA 34* 26* 31*  GFRAA 39* 30* 36*  ANIONGAP 11 9 14     No results for input(s): PROT, ALBUMIN, AST, ALT, ALKPHOS, BILITOT in the last 168 hours. Hematology Recent Labs  Lab 04/09/19 1220 04/10/19 1707 04/11/19 0528  WBC 8.5 8.0 6.9  RBC 3.59* 3.59* 3.79*  HGB 9.3* 9.2* 9.8*  HCT 30.4* 30.5* 32.2*  MCV 84.7 85.0 85.0  MCH 25.9* 25.6* 25.9*  MCHC 30.6 30.2 30.4  RDW 16.5* 16.5* 16.5*  PLT 256 260 251   BNP Recent Labs  Lab 04/09/19 1432  BNP 115.0*    DDimer No results for input(s): DDIMER in the last 168 hours.   Radiology/Studies:  Dg Chest 2 View  Result Date: 04/10/2019 CLINICAL DATA:  Shortness of breath EXAM: CHEST - 2 VIEW COMPARISON:  Radiograph 04/09/2019, CT 02/20/2019 FINDINGS: Hazy interstitial opacities with cephalized, indistinct vascularity. Cardiomediastinal contours are stable prior. No pneumothorax or effusion. No acute  osseous or soft tissue abnormality. IMPRESSION: Appearance compatible with CHF with mild cardiomegaly and features of interstitial edema. Electronically Signed   By: Lovena Le M.D.   On: 04/10/2019 17:40   Dg Chest 2 View  Result Date: 04/09/2019 CLINICAL DATA:  Shortness of breath, hemoptysis EXAM: CHEST - 2 VIEW COMPARISON:  03/26/2019 FINDINGS: Mild cardiomegaly. Pulmonary vascular congestion with increased interstitial opacities in a perihilar and bibasilar distribution. No pleural effusion. No pneumothorax. IMPRESSION: Mild cardiomegaly with pulmonary vascular congestion suggesting CHF. Bilateral interstitial opacities suggest interstitial edema. Electronically Signed   By: Davina Poke M.D.   On: 04/09/2019 13:12   Nm Pulmonary Vent And Perf (v/q Scan)  Result Date: 04/09/2019 CLINICAL DATA:  Shortness of breath with hemoptysis EXAM: NUCLEAR MEDICINE VENTILATION - PERFUSION LUNG SCAN VIEWS: Anterior, posterior, left lateral, right lateral, RPO, LPO, RAO, LAO-ventilation and perfusion RADIOPHARMACEUTICALS:  30.10 mCi of Tc-61m DTPA aerosol inhalation and 4.03 mCi Tc24m MAA IV COMPARISON:  Chest radiograph April 09, 2019 FINDINGS: Ventilation: Radiotracer uptake is homogeneous and symmetric bilaterally. No ventilation defects evident. Perfusion: Radiotracer uptake is homogeneous and symmetric bilaterally. No perfusion defects are evident. IMPRESSION:  No appreciable ventilation or perfusion defects. Very low probability of pulmonary embolus. Electronically Signed   By: Lowella Grip III M.D.   On: 04/09/2019 16:32   US Venous Img Lower Bilateral  Result Date: 04/09/2019 CLINICAL DATA:  Hemoptysis, shortness of breath EXAM: BILATERAL LOWER EXTREMITY VENOUS DOPPLER ULTRASOUND TECHNIQUE: Gray-scale sonography with compression, as well as color and duplex ultrasound, were performed to evaluate the deep venous system from the level of the common femoral vein through the popliteal and  proximal calf veins. COMPARISON:  None FINDINGS: Normal compressibility of the common femoral, superficial femoral, and popliteal veins, as well as the proximal calf veins. No filling defects to suggest DVT on grayscale or color Doppler imaging. Doppler waveforms show normal direction of venous flow, normal respiratory phasicity and response to augmentation. IMPRESSION: No femoropopliteal and no calf DVT in the visualized calf veins. If clinical symptoms are inconsistent or if there are persistent or worsening symptoms, further imaging (possibly involving the iliac veins) may be warranted. Electronically Signed   By: Lucrezia Europe M.D.   On: 04/09/2019 17:14    Assessment and Plan:   --Hemoptysis Sent over from primary care to the emergency room for work-up of his hemoptysis, still in the emergency room Has outpatient study with GI scheduled in 10 days -Denies significant hemoptysis overnight Currently seems relatively stable, though slow trend downward over the past several months -Anemia likely contributing to some of his chronic shortness of breath  COPD Chronic, stable Would strongly recommend we ambulate him to monitor saturations Would hope he qualifies for oxygen as needed given multiple readmissions to the emergency room for chronic stable shortness of breath -He has had significant work-up for shortness of breath including right heart catheterization, VQ scan, stress testing, echocardiogram.  -Symptoms exacerbated by anemia -He has had worsening renal function single kidney with climbing BUN with diuresis concerning for prerenal state Would not give IV fluids given  narrow therapeutic window  Hypertension Stressed importance of taking his medications He does have a history of chronic poorly controlled hypertension Appears to be on amlodipine 10 hydralazine 100 3 times daily isosorbide 30 metoprolol succinate 50 --- Recommend to increase Imdur up to 30 twice daily given blood pressure  elevated  Poorly controlled diabetes type 2 with complications -Long discussion concerning his diet He lives alone eats out of his freezer Was not very forthcoming about some of his diet Weight is 270 pounds, likely 70 pounds above his ideal weight Morbid obesity likely contributing to his shortness of breath   Total encounter time more than 110 minutes  Greater than 50% was spent in counseling and coordination of care with the patient   For questions or updates, please contact [facility] Please consult [email or web address] for contact info under     Signed, [name], MD  04/11/2019 8:48 AM

## 2019-04-11 NOTE — Consult Note (Signed)
Cephas Darby, MD [address]  [place]  [place], [place] [address]  Main: [phone]  Fax: [phone] Pager: [phone]   Consultation  Referring Provider:     No ref. provider found Primary Care Physician:  [name], NP Primary Gastroenterologist: Althia Forts         Reason for Consultation:     Hemoptysis, iron deficiency anemia, rectal bleeding  Date of Admission:  04/10/2019 Date of Consultation:  04/11/2019         HPI:   Larry Callahan is a 63 y.o. male with history of morbid obesity, obstructive sleep apnea, metabolic syndrome, CKD stage III, diastolic heart failure.  Patient had with multiple ER admissions secondary to shortness of breath and chest pain, cardiac work-up has been negative and VQ scan with low probability for PE.  Patient is seen by Dr. Rockey Situ during this admission with no further cardiac work-up recommended.  Patient was told by his PCP to go to ER yesterday as he started spitting up bright red blood since yesterday associated with shortness of breath.  Chart review reveals that patient has developed anemia since 02/2018, gradually declining to around 9.  He does have severe iron deficiency as well.  No known chronic liver disease.  Patient denies smoking or alcohol use.  He reports rectal bleeding is minimal and his bowel movements are regular.  He denies nausea, vomiting, abdominal pain  When I saw him in the ER, he on CPAP and speaking in full sentences, asking if he can eat solid food  NSAIDs: None  Antiplts/Anticoagulants/Anti thrombotics: None  GI Procedures: None He denies family history of GI malignancy  Past Medical History:  Diagnosis Date   Allergy    Coronary artery disease    Diabetes mellitus without complication (HCC)    Diastolic CHF (HCC)    GERD (gastroesophageal reflux disease)    Hyperlipidemia    Hypertension    Renal insufficiency    Sleep apnea     Past Surgical History:  Procedure  Laterality Date   BACK SURGERY     CHOLECYSTECTOMY     left arm surgery     nephrectomy Left    PARATHYROIDECTOMY     RIGHT HEART CATH N/A 03/29/2019   Procedure: RIGHT HEART CATH;  Surgeon: [name], MD;  Location: [facility];  Service: Cardiovascular;  Laterality: N/A;    Prior to Admission medications   Medication Sig Start Date End Date Taking? Authorizing Provider  amLODipine (NORVASC) 10 MG tablet Take 1 tablet (10 mg total) by mouth daily. 03/12/19  Yes [name], MD  aspirin EC 81 MG tablet Take 81 mg by mouth daily.   Yes [provider]  ferrous gluconate (FERGON) 324 MG tablet Take 1 tablet (324 mg total) by mouth daily with breakfast. 12/31/18  Yes Boscia, Heather E, NP  Fluticasone-Salmeterol (ADVAIR DISKUS) 250-50 MCG/DOSE AEPB Inhale 1 puff into the lungs 2 (two) times daily. 03/05/19 03/04/20 Yes Gouru, Illene Silver, MD  furosemide (LASIX) 20 MG tablet Take 20 mg (1 tab) by mouth once a day for weight 269 lb or less.  Take 40 mg (2 tabs) by mouth once a day for weight 270 lb or higher. 03/12/19  Yes [name], MD  gemfibrozil (LOPID) 600 MG tablet Take 1 tablet (600 mg total) by mouth 2 (two) times daily with a meal. 02/28/19  Yes Boscia, Heather E, NP  glimepiride (AMARYL) 2 MG tablet  Take 1 tablet (2 mg total) by mouth daily before breakfast. 03/13/19  Yes Boscia, Heather E, NP  hydrALAZINE (APRESOLINE) 100 MG tablet Take 1 tablet (100 mg total) by mouth 3 (three) times daily. 03/31/19  Yes Gouru, Aruna, MD  Insulin Glargine, 1 Unit Dial, (TOUJEO SOLOSTAR) 300 UNIT/ML SOPN Inject 26 Units into the skin every evening. 03/13/19 06/11/19 Yes Boscia, Greer Ee, NP  isosorbide mononitrate (IMDUR) 30 MG 24 hr tablet Take 1 tablet (30 mg total) by mouth daily. 03/12/19  Yes [name], MD  loratadine (CLARITIN) 10 MG tablet Take 1 tablet (10 mg total) by mouth daily. 04/01/19  Yes Gouru, Illene Silver, MD  metoprolol succinate (TOPROL-XL) 50 MG 24 hr  tablet Take 1 tablet (50 mg total) by mouth at bedtime. Take with or immediately following a meal. 03/12/19  Yes Gollan, Kathlene November, MD  omeprazole (PRILOSEC) 40 MG capsule Take 1 capsule (40 mg total) by mouth daily. 03/01/19  Yes Scarboro, Audie Clear, NP  acetaminophen (TYLENOL) 325 MG tablet Take 2 tablets (650 mg total) by mouth every 4 (four) hours as needed for headache or mild pain. 03/05/19   Gouru, Illene Silver, MD  albuterol (VENTOLIN HFA) 108 (90 Base) MCG/ACT inhaler Inhale 2 puffs into the lungs every 6 (six) hours as needed for wheezing or shortness of breath. 03/05/19   Nicholes Mango, MD  Alcohol Swabs PADS 1 each. 04/04/18   [provider]  Blood Glucose Calibration (TRUE METRIX LEVEL 1) Low SOLN Use as directed 04/04/18   [name], NP  Blood Glucose Monitoring Suppl (TRUE METRIX AIR GLUCOSE METER) DEVI 1 Device by Does not apply route 2 (two) times daily. 04/13/18   [name], NP  clobetasol cream (TEMOVATE) 9.98 % Apply 1 application topically 2 (two) times daily. 12/31/18   [name], NP  glucose blood (TRUE METRIX BLOOD GLUCOSE TEST) test strip Use as instructed twice daily diag E11.65 11/19/18   [name], NP  ipratropium-albuterol (DUONEB) 0.5-2.5 (3) MG/3ML SOLN Take 3 mLs by nebulization every 6 (six) hours as needed. 03/08/19   Scarboro, Audie Clear, NP  nicotine (NICODERM CQ - DOSED IN MG/24 HOURS) 21 mg/24hr patch Place 1 patch (21 mg total) onto the skin daily. 12/31/18   [name], NP  NOVOFINE 32G X 6 MM MISC To use with [place] injections daily 12/13/18   [name], NP  triamcinolone ointment (KENALOG) 0.1 % Apply 1 application topically 2 (two) times daily. 11/23/18   [name], NP  TRUEPLUS LANCETS 33G MISC Use as directed twice daily diag E11.65 04/04/18   [name], NP    Current Facility-Administered Medications:    acetaminophen (TYLENOL) tablet 650 mg, 650 mg, Oral, Q6H PRN **OR** acetaminophen (TYLENOL) suppository 650  mg, 650 mg, Rectal, Q6H PRN, Leslye Peer, Richard, MD   albuterol (VENTOLIN HFA) 108 (90 Base) MCG/ACT inhaler 2 puff, 2 puff, Inhalation, Q6H, Wieting, Richard, MD, 2 puff at 04/11/19 0803   amLODipine (NORVASC) tablet 10 mg, 10 mg, Oral, Daily, Wieting, Richard, MD, 10 mg at 04/11/19 1209   cyanocobalamin ((VITAMIN B-12)) injection 1,000 mcg, 1,000 mcg, Intramuscular, Once, Shabnam Ladd, Tally Due, MD   Derrill Memo ON 04/12/2019] ferumoxytol [facility]) 510 mg in sodium chloride 0.9 % 100 mL IVPB, 510 mg, Intravenous, Once, Homero Hyson, Tally Due, MD   hydrALAZINE (APRESOLINE) tablet 100 mg, 100 mg, Oral, TID, Wieting, Richard, MD, 100 mg at 04/11/19 1207   insulin aspart (novoLOG) injection 0-5 Units, 0-5  Units, Subcutaneous, QHS, Wieting, Richard, MD   insulin aspart (novoLOG) injection 0-9 Units, 0-9 Units, Subcutaneous, TID WC, Wieting, Richard, MD, 1 Units at 04/11/19 1207   insulin glargine (LANTUS) injection 20 Units, 20 Units, Subcutaneous, QPM, Sudini, Srikar, MD   isosorbide mononitrate (IMDUR) 24 hr tablet 30 mg, 30 mg, Oral, BID, Gollan, Kathlene November, MD, 30 mg at 04/11/19 1208   loratadine (CLARITIN) tablet 10 mg, 10 mg, Oral, Daily, Wieting, Richard, MD, 10 mg at 04/11/19 1208   magnesium citrate solution 1 Bottle, 1 Bottle, Oral, Once, [place]aban [place], Tally Due, MD   methylPREDNISolone sodium succinate (SOLU-MEDROL) 40 mg/mL injection 40 mg, 40 mg, Intravenous, Daily, Leslye Peer, Richard, MD, 40 mg at 04/11/19 2197   metoprolol succinate (TOPROL-XL) 24 hr tablet 50 mg, 50 mg, Oral, QHS, Wieting, Richard, MD, 50 mg at 04/11/19 0431   mometasone-formoterol (DULERA) 200-5 MCG/ACT inhaler 2 puff, 2 puff, Inhalation, BID, Sudini, Srikar, MD, 2 puff at 04/11/19 0909   ondansetron (ZOFRAN) tablet 4 mg, 4 mg, Oral, Q6H PRN **OR** ondansetron (ZOFRAN) injection 4 mg, 4 mg, Intravenous, Q6H PRN, Wieting, Richard, MD   pantoprazole (PROTONIX) injection 40 mg, 40 mg, Intravenous, Q12H, Wieting, Richard, MD,  40 mg at 04/11/19 0909   polyethylene glycol-electrolytes (NuLYTELY/GoLYTELY) solution 4,000 mL, 4,000 mL, Oral, Once, Ayat Drenning, Tally Due, MD   Derrill Memo ON 04/12/2019] vitamin B-12 (CYANOCOBALAMIN) tablet 1,000 mcg, 1,000 mcg, Oral, Daily, Robinn Overholt, Tally Due, MD  Family History  Problem Relation Age of Onset   Diabetes Mother    Lung cancer Father    Diabetes Sister    Hypertension Sister    Heart disease Sister    Cancer Sister    Bone cancer Brother      Social History   Tobacco Use   Smoking status: Former Smoker   Smokeless tobacco: Former Systems developer    Types: Chew  Substance Use Topics   Alcohol use: No    Frequency: Never   Drug use: No    Allergies as of 04/10/2019 - Review Complete 04/10/2019  Allergen Reaction Noted   Penicillins  07/20/2017    Review of Systems:    All systems reviewed and negative except where noted in HPI.   Physical Exam:  Vital signs in last 24 hours: Temp:  [97.7 F (36.5 C)-98.6 F (37 C)] 97.7 F (36.5 C) (10/29 1554) Pulse Rate:  [71-95] 79 (10/29 1554) Resp:  [14-24] 18 (10/29 1554) BP: (136-176)/(63-91) 157/78 (10/29 1554) SpO2:  [93 %-99 %] 97 % (10/29 1554) Weight:  [120 kg] 120 kg (10/28 1629)   General:   Pleasant, cooperative in NAD Head:  Normocephalic and atraumatic. Eyes:   No icterus.   Conjunctiva pink. PERRLA. Ears:  Normal auditory acuity. Neck:  Supple; short neck, no masses or thyroidomegaly Lungs: Respirations even and unlabored. Lungs clear to auscultation bilaterally.   No wheezes, crackles, or rhonchi.  Heart:  Regular rate and rhythm;  Without murmur, clicks, rubs or gallops Abdomen:  Soft, obese, nondistended, nontender. Normal bowel sounds. No appreciable masses or hepatomegaly.  No rebound or guarding.  Rectal:  Not performed. Msk:  Symmetrical without gross deformities.  Strength normal Extremities:  Without edema, cyanosis or clubbing. Neurologic:  Alert and oriented x3;  grossly normal  neurologically. Skin:  Intact without significant lesions or rashes. Psych:  Alert and cooperative. Normal affect.  LAB RESULTS: CBC Latest Ref Rng & Units 04/11/2019 04/10/2019 04/09/2019  WBC 4.0 - 10.5 K/uL 6.9 8.0 8.5  Hemoglobin 13.0 - 17.0  g/dL 9.8(L) 9.2(L) 9.3(L)  Hematocrit 39.0 - 52.0 % 32.2(L) 30.5(L) 30.4(L)  Platelets 150 - 400 K/uL 251 260 256    BMET BMP Latest Ref Rng & Units 04/11/2019 04/10/2019 04/09/2019  Glucose 70 - 99 mg/dL 198(H) 147(H) 288(H)  BUN 8 - 23 mg/dL 40(H) 38(H) 30(H)  Creatinine 0.61 - 1.24 mg/dL 2.14(H) 2.50(H) 2.01(H)  BUN/Creat Ratio 10 - 24 - - -  Sodium 135 - 145 mmol/L 142 138 141  Potassium 3.5 - 5.1 mmol/L 3.9 3.5 3.8  Chloride 98 - 111 mmol/L 107 107 110  CO2 22 - 32 mmol/L 21(L) 22 20(L)  Calcium 8.9 - 10.3 mg/dL 9.4 8.9 9.2    LFT Hepatic Function Latest Ref Rng & Units 03/14/2019 02/27/2018 03/06/2013  Total Protein 6.5 - 8.1 g/dL 7.5 6.2 7.3  Albumin 3.5 - 5.0 g/dL 3.4(L) 3.6 3.4  AST 15 - 41 U/L _0 ALT 0 - 44 U/L _1 Alk Phosphatase 38 - 126 U/L 65 67 99  Total Bilirubin 0.3 - 1.2 mg/dL 0.5 0.3 0.4     STUDIES: Dg Chest 2 View  Result Date: 04/10/2019 CLINICAL DATA:  Shortness of breath EXAM: CHEST - 2 VIEW COMPARISON:  Radiograph 04/09/2019, CT 02/20/2019 FINDINGS: Hazy interstitial opacities with cephalized, indistinct vascularity. Cardiomediastinal contours are stable prior. No pneumothorax or effusion. No acute osseous or soft tissue abnormality. IMPRESSION: Appearance compatible with CHF with mild cardiomegaly and features of interstitial edema. Electronically Signed   By: Lovena Le M.D.   On: 04/10/2019 17:40   Nm Pulmonary Vent And Perf (v/q Scan)  Result Date: 04/09/2019 CLINICAL DATA:  Shortness of breath with hemoptysis EXAM: NUCLEAR MEDICINE VENTILATION - PERFUSION LUNG SCAN VIEWS: Anterior, posterior, left lateral, right lateral, RPO, LPO, RAO, LAO-ventilation and perfusion RADIOPHARMACEUTICALS:   30.10 mCi of Tc-63mDTPA aerosol inhalation and 4.03 mCi Tc930mAA IV COMPARISON:  Chest radiograph April 09, 2019 FINDINGS: Ventilation: Radiotracer uptake is homogeneous and symmetric bilaterally. No ventilation defects evident. Perfusion: Radiotracer uptake is homogeneous and symmetric bilaterally. No perfusion defects are evident. IMPRESSION: No appreciable ventilation or perfusion defects. Very low probability of pulmonary embolus. Electronically Signed   By: WiLowella GripII M.D.   On: 04/09/2019 16:32   UsKoreaenous Img Lower Bilateral  Result Date: 04/09/2019 CLINICAL DATA:  Hemoptysis, shortness of breath EXAM: BILATERAL LOWER EXTREMITY VENOUS DOPPLER ULTRASOUND TECHNIQUE: Gray-scale sonography with compression, as well as color and duplex ultrasound, were performed to evaluate the deep venous system from the level of the common femoral vein through the popliteal and proximal calf veins. COMPARISON:  None FINDINGS: Normal compressibility of the common femoral, superficial femoral, and popliteal veins, as well as the proximal calf veins. No filling defects to suggest DVT on grayscale or color Doppler imaging. Doppler waveforms show normal direction of venous flow, normal respiratory phasicity and response to augmentation. IMPRESSION: No femoropopliteal and no calf DVT in the visualized calf veins. If clinical symptoms are inconsistent or if there are persistent or worsening symptoms, further imaging (possibly involving the iliac veins) may be warranted. Electronically Signed   By: D Lucrezia Europe.D.   On: 04/09/2019 17:14      Impression / Plan:   Larry Callahan is a [age].o. male with metabolic syndrome, diastolic heart failure, obstructive sleep apnea not on CPAP as outpatient admitted with hemoptysis, shortness of breath, rectal bleeding.  He is no longer experiencing hemoptysis and his rectal bleeding is transient.  He  is also found to have severe iron deficiency and B12 deficiency  anemia.  Patient is already evaluated by cardiology and no further cardiac work-up is recommended at this time Given patient's multiple comorbidities, I recommend upper endoscopy and colonoscopy +/-VCE as inpatient in controlled setting rather than performing as outpatient and patient is agreeable Clear liquid diet Bowel prep ordered N.p.o. past midnight Parenteral iron and B12 replacement as patient has severe iron and B12 deficiency Thank you for involving me in the care of this patient.  We will follow along with you    LOS: 0 days   Sherri Sear, MD  04/11/2019, 4:16 PM   Note: This dictation was prepared with Dragon dictation along with smaller phrase technology. Any transcriptional errors that result from this process are unintentional.

## 2019-04-12 ENCOUNTER — Inpatient Hospital Stay: Payer: Medicare PPO

## 2019-04-12 ENCOUNTER — Encounter
Admission: EM | Disposition: A | Discharge status: Home / Self Care | Payer: Self-pay | Source: Home / Self Care | Attending: Internal Medicine

## 2019-04-12 DIAGNOSIS — K449 Diaphragmatic hernia without obstruction or gangrene: Secondary | ICD-10-CM | POA: Diagnosis not present

## 2019-04-12 DIAGNOSIS — D509 Iron deficiency anemia, unspecified: Secondary | ICD-10-CM | POA: Diagnosis not present

## 2019-04-12 DIAGNOSIS — K228 Other specified diseases of esophagus: Secondary | ICD-10-CM | POA: Diagnosis not present

## 2019-04-12 DIAGNOSIS — K635 Polyp of colon: Secondary | ICD-10-CM | POA: Diagnosis not present

## 2019-04-12 HISTORY — PX: COLONOSCOPY WITH PROPOFOL: SHX5780

## 2019-04-12 HISTORY — PX: ESOPHAGOGASTRODUODENOSCOPY: SHX5428

## 2019-04-12 LAB — CBC WITH DIFFERENTIAL/PLATELET
Abs Immature Granulocytes: 0.08 10*3/uL — ABNORMAL HIGH (ref 0.00–0.07)
Basophils Absolute: 0.1 10*3/uL (ref 0.0–0.1)
Basophils Relative: 1 %
Eosinophils Absolute: 0.2 10*3/uL (ref 0.0–0.5)
Eosinophils Relative: 2 %
HCT: 31.7 % — ABNORMAL LOW (ref 39.0–52.0)
Hemoglobin: 9.9 g/dL — ABNORMAL LOW (ref 13.0–17.0)
Immature Granulocytes: 1 %
Lymphocytes Relative: 19 %
Lymphs Abs: 1.7 10*3/uL (ref 0.7–4.0)
MCH: 26 pg (ref 26.0–34.0)
MCHC: 31.2 g/dL (ref 30.0–36.0)
MCV: 83.2 fL (ref 80.0–100.0)
Monocytes Absolute: 0.7 10*3/uL (ref 0.1–1.0)
Monocytes Relative: 8 %
Neutro Abs: 6.2 10*3/uL (ref 1.7–7.7)
Neutrophils Relative %: 69 %
Platelets: 324 10*3/uL (ref 150–400)
RBC: 3.81 MIL/uL — ABNORMAL LOW (ref 4.22–5.81)
RDW: 16.2 % — ABNORMAL HIGH (ref 11.5–15.5)
WBC: 8.9 10*3/uL (ref 4.0–10.5)
nRBC: 0 % (ref 0.0–0.2)

## 2019-04-12 LAB — GLUCOSE, CAPILLARY
Glucose-Capillary: 89 mg/dL (ref 70–99)
Glucose-Capillary: 76 mg/dL (ref 70–99)
Glucose-Capillary: 150 mg/dL — ABNORMAL HIGH (ref 70–99)
Glucose-Capillary: 384 mg/dL — ABNORMAL HIGH (ref 70–99)

## 2019-04-12 LAB — BASIC METABOLIC PANEL
Anion gap: 9 (ref 5–15)
BUN: 41 mg/dL — ABNORMAL HIGH (ref 8–23)
CO2: 23 mmol/L (ref 22–32)
Calcium: 9.3 mg/dL (ref 8.9–10.3)
Chloride: 108 mmol/L (ref 98–111)
Creatinine, Ser: 2.02 mg/dL — ABNORMAL HIGH (ref 0.61–1.24)
GFR calc Af Amer: 39 mL/min — ABNORMAL LOW (ref >60)
GFR calc non Af Amer: 34 mL/min — ABNORMAL LOW (ref >60)
Glucose, Bld: 87 mg/dL (ref 70–99)
Potassium: 3.4 mmol/L — ABNORMAL LOW (ref 3.5–5.1)
Sodium: 140 mmol/L (ref 135–145)

## 2019-04-12 SURGERY — EGD (ESOPHAGOGASTRODUODENOSCOPY)
Anesthesia: General

## 2019-04-12 MED ORDER — PROPOFOL 10 MG/ML IV BOLUS
INTRAVENOUS | Status: DC | PRN
  Administered 2019-04-12: 100 mg via INTRAVENOUS

## 2019-04-12 MED ORDER — FUROSEMIDE 10 MG/ML IJ SOLN
60.0000 mg | Freq: Once | INTRAMUSCULAR | Status: AC
  Administered 2019-04-12: 60 mg via INTRAVENOUS
  Filled 2019-04-12: qty 6

## 2019-04-12 MED ORDER — LIDOCAINE HCL (CARDIAC) PF 100 MG/5ML IV SOSY
PREFILLED_SYRINGE | INTRAVENOUS | Status: DC | PRN
  Administered 2019-04-12: 50 mg via INTRAVENOUS

## 2019-04-12 MED ORDER — CYANOCOBALAMIN 1000 MCG/ML IJ SOLN
1000.0000 ug | Freq: Once | INTRAMUSCULAR | Status: AC
  Administered 2019-04-12: 1000 ug via INTRAMUSCULAR
  Filled 2019-04-12: qty 1

## 2019-04-12 MED ORDER — CYANOCOBALAMIN 1000 MCG/ML IJ SOLN
1000.0000 ug | Freq: Once | INTRAMUSCULAR | Status: DC
  Filled 2019-04-12: qty 1

## 2019-04-12 MED ORDER — SODIUM CHLORIDE 0.9 % IV SOLN
INTRAVENOUS | Status: DC
  Administered 2019-04-12: 14:00:00 via INTRAVENOUS

## 2019-04-12 MED ORDER — PROPOFOL 500 MG/50ML IV EMUL
INTRAVENOUS | Status: DC | PRN
  Administered 2019-04-12: 120 ug/kg/min via INTRAVENOUS

## 2019-04-12 MED ORDER — POTASSIUM CHLORIDE CRYS ER 20 MEQ PO TBCR
40.0000 meq | EXTENDED_RELEASE_TABLET | Freq: Once | ORAL | Status: AC
  Administered 2019-04-12: 40 meq via ORAL
  Filled 2019-04-12: qty 2

## 2019-04-12 NOTE — Op Note (Signed)
[facility] Gastroenterology Patient Name: Larry Callahan Procedure Date: 04/12/2019 2:44 PM MRN: [id number] Account #: [id number] Date of Birth: 1956-03-29 Admit Type: Ambulatory Age: 63 Room: [facility] ENDO ROOM 3 Gender: Male Note Status: Finalized Procedure:            Upper GI endoscopy Indications:          Unexplained iron deficiency anemia Providers:            [name] MD, MD Medicines:            Monitored Anesthesia Care Complications:        No immediate complications. Estimated blood loss: None. Procedure:            Pre-Anesthesia Assessment:                       - Prior to the procedure, a History and Physical was                        performed, and patient medications and allergies were                        reviewed. The patient is competent. The risks and                        benefits of the procedure and the sedation options and                        risks were discussed with the patient. All questions                        were answered and informed consent was obtained.                        Patient identification and proposed procedure were                        verified by the physician, the nurse, the                        anesthesiologist, the anesthetist and the technician in                        the pre-procedure area in the procedure room in the                        endoscopy suite. Mental Status Examination: alert and                        oriented. Airway Examination: normal oropharyngeal                        airway and neck mobility. Respiratory Examination:                        clear to auscultation. CV Examination: normal.                        Prophylactic Antibiotics: The patient does not require  prophylactic antibiotics. Prior Anticoagulants: The                        patient has taken no previous anticoagulant or                        antiplatelet agents. ASA Grade Assessment:  III - A                        patient with severe systemic disease. After reviewing                        the risks and benefits, the patient was deemed in                        satisfactory condition to undergo the procedure. The                        anesthesia plan was to use monitored anesthesia care                        (MAC). Immediately prior to administration of                        medications, the patient was re-assessed for adequacy                        to receive sedatives. The heart rate, respiratory rate,                        oxygen saturations, blood pressure, adequacy of                        pulmonary ventilation, and response to care were                        monitored throughout the procedure. The physical status                        of the patient was re-assessed after the procedure.                       After obtaining informed consent, the endoscope was                        passed under direct vision. Throughout the procedure,                        the patient's blood pressure, pulse, and oxygen                        saturations were monitored continuously. The Endoscope                        was introduced through the mouth, and advanced to the                        second part of duodenum. The upper GI endoscopy was  accomplished without difficulty. The patient tolerated                        the procedure well. Findings:      The duodenal bulb and second portion of the duodenum were normal.       Biopsies for histology were taken with a cold forceps for evaluation of       celiac disease.      The entire examined stomach was normal. Biopsies were taken with a cold       forceps for Helicobacter pylori testing.      The cardia and gastric fundus were normal on retroflexion.      A small hiatal hernia was present.      Esophagogastric landmarks were identified: the gastroesophageal junction       was found at 40 cm  from the incisors.      Islands of salmon-colored mucosa were present from 39 to 40 cm. No other       visible abnormalities were present. The maximum longitudinal extent of       these esophageal mucosal changes was 1 cm in length. Biopsies were taken       with a cold forceps for histology.      The examined esophagus was normal. Impression:           - Normal duodenal bulb and second portion of the                        duodenum. Biopsied.                       - Normal stomach. Biopsied.                       - Small hiatal hernia.                       - Esophagogastric landmarks identified.                       - Salmon-colored mucosa suspicious for short-segment                        Barrett's esophagus. Biopsied. Recommendation:       - Await pathology results.                       - Follow an antireflux regimen.                       - Use Prilosec (omeprazole) 20 mg PO BID long term.                       - Proceed with colonoscopy as scheduled                       See colonoscopy report                       - Return to GI office in 2 months. Procedure Code(s):    --- Professional ---                       [phone], Esophagogastroduodenoscopy, flexible, transoral;  with biopsy, single or multiple Diagnosis Code(s):    --- Professional ---                       K22.8, Other specified diseases of esophagus                       K44.9, Diaphragmatic hernia without obstruction or                        gangrene                       D50.9, Iron deficiency anemia, unspecified CPT copyright 2019 [employer] Medical [employer]. All rights reserved. The codes documented in this report are preliminary and upon coder review may  be revised to meet current compliance requirements. Dr. Ulyess Mort [name] MD, MD 04/12/2019 3:05:24 PM This report has been signed electronically. Number of Addenda: 0 Note Initiated On: 04/12/2019 2:44 PM Estimated  Blood Loss: Estimated blood loss: none. Estimated blood loss: none.      [facility]

## 2019-04-12 NOTE — Progress Notes (Signed)
[place] [place] [facility] [place] NAME: Larry Callahan    MR#:  [id number]  DATE OF BIRTH:  August 13, 1955  SUBJECTIVE:  CHIEF COMPLAINT:   Chief Complaint  Patient presents with  . Shortness of Breath   Shortness of breath with some improvement today. Clear sputum with coughing. No melena in the hospital  REVIEW OF SYSTEMS:    Review of Systems  Constitutional: Positive for malaise/fatigue. Negative for chills and fever.  HENT: Negative for sore throat.   Eyes: Negative for blurred vision, double vision and pain.  Respiratory: Positive for cough and shortness of breath. Negative for hemoptysis and wheezing.   Cardiovascular: Positive for orthopnea. Negative for chest pain, palpitations and leg swelling.  Gastrointestinal: Positive for melena. Negative for abdominal pain, constipation, diarrhea, heartburn, nausea and vomiting.  Genitourinary: Negative for dysuria and hematuria.  Musculoskeletal: Negative for back pain and joint pain.  Skin: Negative for rash.  Neurological: Negative for sensory change, speech change, focal weakness and headaches.  Endo/Heme/Allergies: Does not bruise/bleed easily.  Psychiatric/Behavioral: Negative for depression. The patient is not nervous/anxious.     DRUG ALLERGIES:   Allergies  Allergen Reactions  . Penicillins     VITALS:  Blood pressure (!) 152/85, pulse 61, temperature 97.8 F (36.6 C), temperature source Oral, resp. rate 18, height 5\' 11"  (1.803 m), weight 118.4 kg, SpO2 95 %.  PHYSICAL EXAMINATION:   Physical Exam  GENERAL:  63 y.o.-year-old patient lying in the bed with no acute distress.  Morbidly obese EYES: Pupils equal, round, reactive to light and accommodation. No scleral icterus. Extraocular muscles intact.  HEENT: Head atraumatic, normocephalic. Oropharynx and nasopharynx clear.  NECK:  Supple, no jugular venous distention. No thyroid enlargement, no tenderness.  LUNGS: Bibasilar  crackles CARDIOVASCULAR: S1, S2 normal. No murmurs, rubs, or gallops.  ABDOMEN: Soft, nontender, nondistended. Bowel sounds present. No organomegaly or mass.  EXTREMITIES: No cyanosis, clubbing or edema b/l.    NEUROLOGIC: Cranial nerves II through XII are intact. No focal Motor or sensory deficits b/l.   PSYCHIATRIC: The patient is alert and oriented x 3.  SKIN: No obvious rash, lesion, or ulcer.   LABORATORY PANEL:   CBC Recent Labs  Lab 04/12/19 0759  WBC 8.9  HGB 9.9*  HCT 31.7*  PLT 324   ------------------------------------------------------------------------------------------------------------------ Chemistries  Recent Labs  Lab 04/12/19 0759  NA 140  K 3.4*  CL 108  CO2 23  GLUCOSE 87  BUN 41*  CREATININE 2.02*  CALCIUM 9.3   ------------------------------------------------------------------------------------------------------------------  Cardiac Enzymes No results for input(s): TROPONINI in the last 168 hours. ------------------------------------------------------------------------------------------------------------------  RADIOLOGY:  Dg Chest 2 View  Result Date: 04/10/2019 CLINICAL DATA:  Shortness of breath EXAM: CHEST - 2 VIEW COMPARISON:  Radiograph 04/09/2019, CT 02/20/2019 FINDINGS: Hazy interstitial opacities with cephalized, indistinct vascularity. Cardiomediastinal contours are stable prior. No pneumothorax or effusion. No acute osseous or soft tissue abnormality. IMPRESSION: Appearance compatible with CHF with mild cardiomegaly and features of interstitial edema. Electronically Signed   By: Lovena Le M.D.   On: 04/10/2019 17:40   ASSESSMENT AND PLAN:   * Acute COPD exacerbation -IV steroids, Antibiotics - Scheduled Nebulizers - Inhalers -Wean O2 as tolerated  * Acute on chronic diastolic chf - IV Lasix-1 more dose today - Input and Output - Counseled to limit fluids and Salt - Monitor Bun/Cr and Potassium - Echo reviewed -Appreciate  cardiology input  * Hemoptysis Due to copd. Resolved   *Iron deficiency anemia with  intermittent melena GI consulted . Scheduled for EGD and colonoscopy later today Monitor hemoglobin and transfuse as needed Received 1 dose of IV iron on admission and receiving second dose today.  Will need iron supplements at discharge  *CKD stage III.  Slowly worsening.  Monitor while being diuresed  *Diabetes mellitus type 2 Continue home insulin and sliding scale insulin  *  Obesity with a BMI of 36.90.  Weight loss needed  All the records are reviewed and case discussed with Care Management/Social Worker Management plans discussed with the patient, family and they are in agreement.  CODE STATUS: FULL CODE  DVT Prophylaxis: SCDs  TOTAL TIME TAKING CARE OF THIS PATIENT: 35 minutes.   POSSIBLE D/C IN 1-2 DAYS, DEPENDING ON CLINICAL CONDITION.  Leia Alf Makinzi Prieur M.D on 04/12/2019 at 9:59 AM  Between 7am to 6pm - Pager - [phone]  After 6pm go to [email or web address] - password [email or web address] [facility] [place] [facility]  Office  [phone]  CC: Primary care physician; [name], NP  Note: This dictation was prepared with Dragon dictation along with smaller phrase technology. Any transcriptional errors that result from this process are unintentional.

## 2019-04-12 NOTE — Op Note (Addendum)
[facility] Gastroenterology Patient Name: Larry Callahan Procedure Date: 04/12/2019 2:44 PM MRN: [id number] Account #: [id number] Date of Birth: [birth year] Admit Type: Outpatient Age: 63 Room: [facility] ENDO ROOM 3 Gender: Male Note Status: Finalized Procedure:            Colonoscopy Indications:          This is the patient's first colonoscopy, Unexplained                        iron deficiency anemia Providers:            [name] MD, MD Medicines:            Monitored Anesthesia Care Complications:        No immediate complications. Estimated blood loss: None. Procedure:            Pre-Anesthesia Assessment:                       - Prior to the procedure, a History and Physical was                        performed, and patient medications and allergies were                        reviewed. The patient is competent. The risks and                        benefits of the procedure and the sedation options and                        risks were discussed with the patient. All questions                        were answered and informed consent was obtained.                        Patient identification and proposed procedure were                        verified by the physician, the nurse, the                        anesthesiologist, the anesthetist and the technician in                        the pre-procedure area in the procedure room in the                        endoscopy suite. Mental Status Examination: alert and                        oriented. Airway Examination: normal oropharyngeal                        airway and neck mobility. Respiratory Examination:                        clear to auscultation. CV Examination: normal.  Prophylactic Antibiotics: The patient does not require                        prophylactic antibiotics. Prior Anticoagulants: The                        patient has taken no previous anticoagulant or                         antiplatelet agents. ASA Grade Assessment: III - A                        patient with severe systemic disease. After reviewing                        the risks and benefits, the patient was deemed in                        satisfactory condition to undergo the procedure. The                        anesthesia plan was to use monitored anesthesia care                        (MAC). Immediately prior to administration of                        medications, the patient was re-assessed for adequacy                        to receive sedatives. The heart rate, respiratory rate,                        oxygen saturations, blood pressure, adequacy of                        pulmonary ventilation, and response to care were                        monitored throughout the procedure. The physical status                        of the patient was re-assessed after the procedure.                       After obtaining informed consent, the colonoscope was                        passed under direct vision. Throughout the procedure,                        the patient's blood pressure, pulse, and oxygen                        saturations were monitored continuously. The                        Colonoscope was introduced through the anus and  advanced to the the cecum, identified by appendiceal                        orifice and ileocecal valve. The colonoscopy was                        performed without difficulty. The patient tolerated the                        procedure well. The quality of the bowel preparation                        was poor. Findings:      The perianal and digital rectal examinations were normal. Pertinent       negatives include normal sphincter tone and no palpable rectal lesions.      Semi-liquid semi-solid stool was found in the entire colon, precluding       visualization.      A 5 mm polyp was found in the sigmoid colon. The polyp was sessile. The        polyp was removed with a cold snare. Resection and retrieval were       complete.      The retroflexed view of the distal rectum and anal verge was normal and       showed no anal or rectal abnormalities. Impression:           - Preparation of the colon was poor.                       - Stool in the entire examined colon.                       - One 5 mm polyp in the sigmoid colon, removed with a                        cold snare. Resected and retrieved.                       - The distal rectum and anal verge are normal on                        retroflexion view. Recommendation:       - Return patient to hospital ward for ongoing care.                       - Resume previous diet today.                       - Continue present medications.                       - Await pathology results.                       - Repeat colonoscopy in 6 months with 2 day prep                        because the bowel preparation was poor.                       - Return to GI  office in 2 months. Procedure Code(s):    --- Professional ---                       [phone], Colonoscopy, flexible; with removal of tumor(s),                        polyp(s), or other lesion(s) by snare technique Diagnosis Code(s):    --- Professional ---                       K63.5, Polyp of colon                       D50.9, Iron deficiency anemia, unspecified CPT copyright 2019 American Medical Association. All rights reserved. The codes documented in this report are preliminary and upon coder review may  be revised to meet current compliance requirements. Dr. Ulyess Mort [name] MD, MD 04/12/2019 3:18:18 PM This report has been signed electronically. Number of Addenda: 0 Note Initiated On: 04/12/2019 2:44 PM Scope Withdrawal Time: 0 hours 6 minutes 53 seconds  Total Procedure Duration: 0 hours 8 minutes 52 seconds  Estimated Blood Loss: Estimated blood loss: none.      [facility]

## 2019-04-12 NOTE — Transfer of Care (Signed)
Immediate Anesthesia Transfer of Care Note  Patient: Omarrion Carmer Ladnier  Procedure(s) Performed: ESOPHAGOGASTRODUODENOSCOPY (EGD) (N/A ) COLONOSCOPY WITH PROPOFOL (N/A )  Patient Location: Endoscopy Unit  Anesthesia Type:General  Level of Consciousness: drowsy and patient cooperative  Airway & Oxygen Therapy: Patient Spontanous Breathing  Post-op Assessment: Report given to RN and Post -op Vital signs reviewed and stable  Post vital signs: Reviewed and stable  Last Vitals:  Vitals Value Taken Time  BP 109/61 04/12/19 1524  Temp 36.2 C 04/12/19 1521  Pulse 63 04/12/19 1525  Resp 19 04/12/19 1525  SpO2 93 % 04/12/19 1525  Vitals shown include unvalidated device data.  Last Pain:  Vitals:   04/12/19 1521  TempSrc: Temporal  PainSc: 0-No pain         Complications: No apparent anesthesia complications

## 2019-04-12 NOTE — Anesthesia Postprocedure Evaluation (Signed)
Anesthesia Post Note  Patient: Larry Callahan  Procedure(s) Performed: ESOPHAGOGASTRODUODENOSCOPY (EGD) (N/A ) COLONOSCOPY WITH PROPOFOL (N/A )  Patient location during evaluation: PACU Anesthesia Type: General Level of consciousness: awake and alert Pain management: pain level controlled Vital Signs Assessment: post-procedure vital signs reviewed and stable Respiratory status: spontaneous breathing, nonlabored ventilation and respiratory function stable Cardiovascular status: blood pressure returned to baseline and stable Postop Assessment: no apparent nausea or vomiting Anesthetic complications: no     Last Vitals:  Vitals:   04/12/19 1551 04/12/19 1626  BP: (!) 155/85 (!) 156/97  Pulse: (!) 57 63  Resp: 16   Temp:    SpO2: 97% 95%    Last Pain:  Vitals:   04/12/19 1551  TempSrc:   PainSc: 0-No pain                 Durenda Hurt

## 2019-04-12 NOTE — Anesthesia Post-op Follow-up Note (Signed)
Anesthesia QCDR form completed.        

## 2019-04-12 NOTE — Anesthesia Preprocedure Evaluation (Signed)
Anesthesia Evaluation  Patient identified by MRN, date of birth, ID band Patient awake    Reviewed: Allergy & Precautions, NPO status , Patient's Chart, lab work & pertinent test results  History of Anesthesia Complications Negative for: history of anesthetic complications  Airway Mallampati: II  TM Distance: >3 FB Neck ROM: Full    Dental  (+) Poor Dentition, Missing   Pulmonary shortness of breath, sleep apnea , COPD,  COPD inhaler, former smoker,    breath sounds clear to auscultation- rhonchi (-) wheezing      Cardiovascular hypertension, Pt. on medications + CAD and +CHF (preserved EF)  (-) Past MI, (-) Cardiac Stents and (-) CABG  Rhythm:Regular Rate:Normal - Systolic murmurs and - Diastolic murmurs Echo 3/71/06:  1. Left ventricular ejection fraction, by visual estimation, is 55 to 60%. The left ventricle has normal function. Normal left ventricular size. Left ventricular septal wall thickness was mildly increased. Mildly increased left ventricular posterior  wall thickness. There is mildly increased left ventricular hypertrophy.  2. Elevated left ventricular end-diastolic pressure.  3. Left ventricular diastolic Doppler parameters are consistent with pseudonormalization pattern of LV diastolic filling.  4. Global right ventricle has normal systolic function.The right ventricular size is normal. No increase in right ventricular wall thickness.  5. Left atrial size was severely dilated.  6. Right atrial size was normal.  7. The mitral valve is normal in structure. Trace mitral valve regurgitation. No evidence of mitral stenosis.  8. The tricuspid valve is normal in structure. Tricuspid valve regurgitation is trivial.  9. The aortic valve is normal in structure. Aortic valve regurgitation was not visualized by color flow Doppler. Mild aortic valve sclerosis without stenosis. 10. The pulmonic valve was normal in structure.  Pulmonic valve regurgitation is trivial by color flow Doppler. 11. Moderately elevated pulmonary artery systolic pressure. 12. The inferior vena cava is dilated in size with >50% respiratory variability, suggesting right atrial pressure of 8 mmHg.   Neuro/Psych neg Seizures negative neurological ROS  negative psych ROS   GI/Hepatic Neg liver ROS, GERD  ,  Endo/Other  diabetes, Insulin Dependent  Renal/GU Renal disease: s/p nephrectomy for renal tumor.     Musculoskeletal   Abdominal (+) + obese,   Peds  Hematology  (+) anemia ,   Anesthesia Other Findings Past Medical History: No date: Allergy No date: Coronary artery disease No date: Diabetes mellitus without complication (HCC) No date: Diastolic CHF (HCC) No date: GERD (gastroesophageal reflux disease) No date: Hyperlipidemia No date: Hypertension No date: Renal insufficiency No date: Sleep apnea   Reproductive/Obstetrics                             Anesthesia Physical Anesthesia Plan  ASA: III  Anesthesia Plan: General   Post-op Pain Management:    Induction: Intravenous  PONV Risk Score and Plan: 1 and Propofol infusion  Airway Management Planned: Natural Airway  Additional Equipment:   Intra-op Plan:   Post-operative Plan:   Informed Consent: I have reviewed the patients History and Physical, chart, labs and discussed the procedure including the risks, benefits and alternatives for the proposed anesthesia with the patient or authorized representative who has indicated his/her understanding and acceptance.     Dental advisory given  Plan Discussed with: CRNA and Anesthesiologist  Anesthesia Plan Comments:         Anesthesia Quick Evaluation

## 2019-04-13 LAB — BASIC METABOLIC PANEL
Anion gap: 8 (ref 5–15)
BUN: 44 mg/dL — ABNORMAL HIGH (ref 8–23)
CO2: 24 mmol/L (ref 22–32)
Calcium: 8.9 mg/dL (ref 8.9–10.3)
Chloride: 109 mmol/L (ref 98–111)
Creatinine, Ser: 2.19 mg/dL — ABNORMAL HIGH (ref 0.61–1.24)
GFR calc Af Amer: 35 mL/min — ABNORMAL LOW (ref >60)
GFR calc non Af Amer: 30 mL/min — ABNORMAL LOW (ref >60)
Glucose, Bld: 124 mg/dL — ABNORMAL HIGH (ref 70–99)
Potassium: 3.6 mmol/L (ref 3.5–5.1)
Sodium: 141 mmol/L (ref 135–145)

## 2019-04-13 LAB — GLUCOSE, CAPILLARY
Glucose-Capillary: 156 mg/dL — ABNORMAL HIGH (ref 70–99)
Glucose-Capillary: 190 mg/dL — ABNORMAL HIGH (ref 70–99)

## 2019-04-13 MED ORDER — PREDNISONE 20 MG PO TABS: 20.0000 mg | ORAL_TABLET | Freq: Every day | ORAL | 0 refills | Status: DC

## 2019-04-13 MED ORDER — FUROSEMIDE 80 MG PO TABS: 80.0000 mg | ORAL_TABLET | Freq: Every day | ORAL | 0 refills | Status: DC

## 2019-04-13 MED ORDER — SPIRIVA HANDIHALER 18 MCG IN CAPS: 18.0000 ug | ORAL_CAPSULE | Freq: Every day | RESPIRATORY_TRACT | 0 refills | Status: DC

## 2019-04-13 MED ORDER — FERROUS SULFATE 325 (65 FE) MG PO TBEC: 325.0000 mg | DELAYED_RELEASE_TABLET | Freq: Two times a day (BID) | ORAL | 0 refills | Status: DC

## 2019-04-13 MED ORDER — ISOSORBIDE MONONITRATE ER 30 MG PO TB24: 30.0000 mg | ORAL_TABLET | Freq: Two times a day (BID) | ORAL | 0 refills | Status: DC

## 2019-04-13 NOTE — Plan of Care (Signed)
Discharge instructions provided to pt.  All questions addressed.  Understanding verified through teach back.  Awaiting transportation home via POV.   Problem: Education: Goal: Knowledge of General Education information will improve Description: Including pain rating scale, medication(s)/side effects and non-pharmacologic comfort measures Outcome: Completed/Met   Problem: Health Behavior/Discharge Planning: Goal: Ability to manage health-related needs will improve Outcome: Completed/Met   Problem: Clinical Measurements: Goal: Ability to maintain clinical measurements within normal limits will improve Outcome: Completed/Met Goal: Will remain free from infection Outcome: Completed/Met Goal: Diagnostic test results will improve Outcome: Completed/Met Goal: Respiratory complications will improve Outcome: Completed/Met Goal: Cardiovascular complication will be avoided Outcome: Completed/Met   Problem: Activity: Goal: Risk for activity intolerance will decrease Outcome: Completed/Met   Problem: Nutrition: Goal: Adequate nutrition will be maintained Outcome: Completed/Met   Problem: Coping: Goal: Level of anxiety will decrease Outcome: Completed/Met   Problem: Elimination: Goal: Will not experience complications related to bowel motility Outcome: Completed/Met Goal: Will not experience complications related to urinary retention Outcome: Completed/Met   Problem: Pain Managment: Goal: General experience of comfort will improve Outcome: Completed/Met   Problem: Safety: Goal: Ability to remain free from injury will improve Outcome: Completed/Met   Problem: Skin Integrity: Goal: Risk for impaired skin integrity will decrease Outcome: Completed/Met   Problem: Education: Goal: Ability to demonstrate management of disease process will improve Outcome: Completed/Met Goal: Ability to verbalize understanding of medication therapies will improve Outcome: Completed/Met Goal:  Individualized Educational Video(s) Outcome: Completed/Met

## 2019-04-13 NOTE — Discharge Instructions (Signed)
°-   Daily fluids < 2 liters. °- Low salt diet °- Check weight everyday and keep log. Take to your doctors appt. °- Take extra dose of lasix if you gain more than 3 pounds weight. ° ° °

## 2019-04-15 ENCOUNTER — Other Ambulatory Visit: Payer: Self-pay

## 2019-04-15 ENCOUNTER — Encounter: Payer: Self-pay | Admitting: Gastroenterology

## 2019-04-15 DIAGNOSIS — J441 Chronic obstructive pulmonary disease with (acute) exacerbation: Secondary | ICD-10-CM

## 2019-04-16 ENCOUNTER — Other Ambulatory Visit: Payer: Self-pay

## 2019-04-16 NOTE — Patient Outreach (Signed)
[place] [facility]) Care Management  04/16/2019  Larry Callahan 1956-02-09 [id number]   Referral Date: 04/15/2019 Referral Source: Hospital liaison Referral Reason: Discharge on 04-13-2019 from [facility] Attempt: No answer.  HIPAA compliant voice message left.      Plan: RN CM will attempt again within 4 business days and send letter.   Jone Baseman, RN, MSN [facility] Care Management Care Management Coordinator Direct Line [phone] Toll Free: [phone]  Fax: [phone]

## 2019-04-17 ENCOUNTER — Telehealth: Payer: Self-pay | Admitting: Gastroenterology

## 2019-04-17 ENCOUNTER — Other Ambulatory Visit: Payer: Self-pay

## 2019-04-17 LAB — SURGICAL PATHOLOGY

## 2019-04-17 NOTE — Patient Outreach (Signed)
[place] [facility]) Care Management  04/17/2019  Neville Walston Miltner [birth year] [id number]   Referral Date: 04/15/2019 Referral Source: Hospital liaison Referral Reason: Discharge on 04-13-2019 from [facility] Attempt: No answer.  HIPAA compliant voice message left.      Plan: RN CM will attempt again within 4 business days.  Jone Baseman, RN, MSN [place] Management Care Management Coordinator Direct Line [phone] Cell [phone] Toll Free: [phone]  Fax: [phone]

## 2019-04-17 NOTE — Telephone Encounter (Signed)
Patient had a poor bowel prep on inpatient colonoscopy.  I offered him repeat colonoscopy next day while inpatient. He preferred to undergo as outpatient only as he wanted to eat.  Recommended outpatient colonoscopy with 2-day prep within next 6 months  Larry Callahan

## 2019-04-17 NOTE — Progress Notes (Signed)
Erosive esophagitis and gastritis. Marland Kitchen

## 2019-04-18 ENCOUNTER — Other Ambulatory Visit: Payer: Self-pay

## 2019-04-18 DIAGNOSIS — K2970 Gastritis, unspecified, without bleeding: Secondary | ICD-10-CM

## 2019-04-18 NOTE — Patient Outreach (Signed)
[place] [facility]) Care Management  04/18/2019  Larry Callahan [birth year] [id number]   Referral Date:04/15/2019 Referral Source:Hospital liaison Referral Reason:Discharge on 04-13-2019 from [facility] Attempt:No answer. HIPAA compliant voice message left.     Plan:RN CM will wait return call.  If no return call will close case.  Jone Baseman, RN, MSN [place] Management Care Management Coordinator Direct Line [phone] Cell [phone] Toll Free: [phone]  Fax: [phone]

## 2019-04-19 ENCOUNTER — Ambulatory Visit: Payer: Self-pay

## 2019-04-22 ENCOUNTER — Other Ambulatory Visit: Payer: Self-pay

## 2019-04-22 DIAGNOSIS — K2970 Gastritis, unspecified, without bleeding: Secondary | ICD-10-CM

## 2019-04-23 ENCOUNTER — Other Ambulatory Visit: Payer: Self-pay

## 2019-04-23 ENCOUNTER — Encounter: Payer: Self-pay | Admitting: Emergency Medicine

## 2019-04-23 ENCOUNTER — Emergency Department
Admission: EM | Admit: 2019-04-23 | Discharge: 2019-04-23 | Disposition: A | Discharge status: Home / Self Care | Payer: Medicare PPO | Source: Home / Self Care | Attending: Student | Admitting: Student

## 2019-04-23 DIAGNOSIS — Z79899 Other long term (current) drug therapy: Secondary | ICD-10-CM | POA: Diagnosis not present

## 2019-04-23 DIAGNOSIS — I5032 Chronic diastolic (congestive) heart failure: Secondary | ICD-10-CM | POA: Diagnosis not present

## 2019-04-23 DIAGNOSIS — N183 Chronic kidney disease, stage 3 unspecified: Secondary | ICD-10-CM | POA: Diagnosis not present

## 2019-04-23 DIAGNOSIS — I130 Hypertensive heart and chronic kidney disease with heart failure and stage 1 through stage 4 chronic kidney disease, or unspecified chronic kidney disease: Secondary | ICD-10-CM | POA: Diagnosis present

## 2019-04-23 DIAGNOSIS — I5033 Acute on chronic diastolic (congestive) heart failure: Secondary | ICD-10-CM | POA: Diagnosis present

## 2019-04-23 DIAGNOSIS — S0990XA Unspecified injury of head, initial encounter: Secondary | ICD-10-CM | POA: Diagnosis not present

## 2019-04-23 DIAGNOSIS — I110 Hypertensive heart disease with heart failure: Secondary | ICD-10-CM | POA: Diagnosis not present

## 2019-04-23 DIAGNOSIS — R079 Chest pain, unspecified: Secondary | ICD-10-CM | POA: Diagnosis not present

## 2019-04-23 DIAGNOSIS — E1122 Type 2 diabetes mellitus with diabetic chronic kidney disease: Secondary | ICD-10-CM | POA: Diagnosis present

## 2019-04-23 DIAGNOSIS — R42 Dizziness and giddiness: Secondary | ICD-10-CM | POA: Diagnosis not present

## 2019-04-23 DIAGNOSIS — R109 Unspecified abdominal pain: Secondary | ICD-10-CM | POA: Diagnosis not present

## 2019-04-23 DIAGNOSIS — D500 Iron deficiency anemia secondary to blood loss (chronic): Secondary | ICD-10-CM | POA: Diagnosis present

## 2019-04-23 DIAGNOSIS — E782 Mixed hyperlipidemia: Secondary | ICD-10-CM | POA: Diagnosis present

## 2019-04-23 DIAGNOSIS — T300 Burn of unspecified body region, unspecified degree: Secondary | ICD-10-CM | POA: Diagnosis not present

## 2019-04-23 DIAGNOSIS — R55 Syncope and collapse: Secondary | ICD-10-CM | POA: Diagnosis not present

## 2019-04-23 DIAGNOSIS — I5189 Other ill-defined heart diseases: Secondary | ICD-10-CM | POA: Diagnosis not present

## 2019-04-23 DIAGNOSIS — R001 Bradycardia, unspecified: Secondary | ICD-10-CM | POA: Diagnosis not present

## 2019-04-23 DIAGNOSIS — Z7951 Long term (current) use of inhaled steroids: Secondary | ICD-10-CM | POA: Diagnosis not present

## 2019-04-23 DIAGNOSIS — R350 Frequency of micturition: Secondary | ICD-10-CM | POA: Diagnosis present

## 2019-04-23 DIAGNOSIS — Z9111 Patient's noncompliance with dietary regimen: Secondary | ICD-10-CM | POA: Diagnosis not present

## 2019-04-23 DIAGNOSIS — W19XXXD Unspecified fall, subsequent encounter: Secondary | ICD-10-CM | POA: Diagnosis not present

## 2019-04-23 DIAGNOSIS — Z7982 Long term (current) use of aspirin: Secondary | ICD-10-CM | POA: Diagnosis not present

## 2019-04-23 DIAGNOSIS — Z87891 Personal history of nicotine dependence: Secondary | ICD-10-CM | POA: Insufficient documentation

## 2019-04-23 DIAGNOSIS — J449 Chronic obstructive pulmonary disease, unspecified: Secondary | ICD-10-CM | POA: Diagnosis not present

## 2019-04-23 DIAGNOSIS — J441 Chronic obstructive pulmonary disease with (acute) exacerbation: Secondary | ICD-10-CM | POA: Diagnosis present

## 2019-04-23 DIAGNOSIS — K2970 Gastritis, unspecified, without bleeding: Secondary | ICD-10-CM | POA: Diagnosis present

## 2019-04-23 DIAGNOSIS — R0902 Hypoxemia: Secondary | ICD-10-CM | POA: Diagnosis not present

## 2019-04-23 DIAGNOSIS — F17219 Nicotine dependence, cigarettes, with unspecified nicotine-induced disorders: Secondary | ICD-10-CM | POA: Diagnosis not present

## 2019-04-23 DIAGNOSIS — N1832 Chronic kidney disease, stage 3b: Secondary | ICD-10-CM | POA: Diagnosis present

## 2019-04-23 DIAGNOSIS — K921 Melena: Secondary | ICD-10-CM

## 2019-04-23 DIAGNOSIS — K219 Gastro-esophageal reflux disease without esophagitis: Secondary | ICD-10-CM | POA: Diagnosis present

## 2019-04-23 DIAGNOSIS — Z833 Family history of diabetes mellitus: Secondary | ICD-10-CM | POA: Diagnosis not present

## 2019-04-23 DIAGNOSIS — Z7952 Long term (current) use of systemic steroids: Secondary | ICD-10-CM | POA: Diagnosis not present

## 2019-04-23 DIAGNOSIS — Z20828 Contact with and (suspected) exposure to other viral communicable diseases: Secondary | ICD-10-CM | POA: Diagnosis present

## 2019-04-23 DIAGNOSIS — E1165 Type 2 diabetes mellitus with hyperglycemia: Secondary | ICD-10-CM | POA: Diagnosis present

## 2019-04-23 DIAGNOSIS — R069 Unspecified abnormalities of breathing: Secondary | ICD-10-CM | POA: Diagnosis not present

## 2019-04-23 DIAGNOSIS — D509 Iron deficiency anemia, unspecified: Secondary | ICD-10-CM | POA: Diagnosis not present

## 2019-04-23 DIAGNOSIS — E669 Obesity, unspecified: Secondary | ICD-10-CM | POA: Diagnosis not present

## 2019-04-23 DIAGNOSIS — S79912A Unspecified injury of left hip, initial encounter: Secondary | ICD-10-CM | POA: Diagnosis not present

## 2019-04-23 DIAGNOSIS — R58 Hemorrhage, not elsewhere classified: Secondary | ICD-10-CM | POA: Diagnosis not present

## 2019-04-23 DIAGNOSIS — G4733 Obstructive sleep apnea (adult) (pediatric): Secondary | ICD-10-CM | POA: Diagnosis present

## 2019-04-23 DIAGNOSIS — S79911A Unspecified injury of right hip, initial encounter: Secondary | ICD-10-CM | POA: Diagnosis not present

## 2019-04-23 DIAGNOSIS — I2510 Atherosclerotic heart disease of native coronary artery without angina pectoris: Secondary | ICD-10-CM | POA: Diagnosis present

## 2019-04-23 DIAGNOSIS — I509 Heart failure, unspecified: Secondary | ICD-10-CM | POA: Diagnosis not present

## 2019-04-23 DIAGNOSIS — R0602 Shortness of breath: Secondary | ICD-10-CM | POA: Diagnosis not present

## 2019-04-23 DIAGNOSIS — R195 Other fecal abnormalities: Secondary | ICD-10-CM | POA: Diagnosis not present

## 2019-04-23 DIAGNOSIS — Z880 Allergy status to penicillin: Secondary | ICD-10-CM | POA: Diagnosis not present

## 2019-04-23 DIAGNOSIS — I10 Essential (primary) hypertension: Secondary | ICD-10-CM | POA: Diagnosis not present

## 2019-04-23 DIAGNOSIS — Z794 Long term (current) use of insulin: Secondary | ICD-10-CM | POA: Insufficient documentation

## 2019-04-23 LAB — COMPREHENSIVE METABOLIC PANEL
ALT: 18 U/L (ref 0–44)
AST: 22 U/L (ref 15–41)
Albumin: 3.3 g/dL — ABNORMAL LOW (ref 3.5–5.0)
Alkaline Phosphatase: 58 U/L (ref 38–126)
Anion gap: 13 (ref 5–15)
BUN: 45 mg/dL — ABNORMAL HIGH (ref 8–23)
CO2: 23 mmol/L (ref 22–32)
Calcium: 9.6 mg/dL (ref 8.9–10.3)
Chloride: 107 mmol/L (ref 98–111)
Creatinine, Ser: 2.01 mg/dL — ABNORMAL HIGH (ref 0.61–1.24)
GFR calc Af Amer: 39 mL/min — ABNORMAL LOW (ref >60)
GFR calc non Af Amer: 34 mL/min — ABNORMAL LOW (ref >60)
Glucose, Bld: 96 mg/dL (ref 70–99)
Potassium: 3 mmol/L — ABNORMAL LOW (ref 3.5–5.1)
Sodium: 143 mmol/L (ref 135–145)
Total Bilirubin: 0.5 mg/dL (ref 0.3–1.2)
Total Protein: 7.3 g/dL (ref 6.5–8.1)

## 2019-04-23 LAB — URINALYSIS, COMPLETE (UACMP) WITH MICROSCOPIC
Bilirubin Urine: NEGATIVE
Glucose, UA: NEGATIVE mg/dL
Hgb urine dipstick: NEGATIVE
Ketones, ur: NEGATIVE mg/dL
Leukocytes,Ua: NEGATIVE
Nitrite: NEGATIVE
Protein, ur: 100 mg/dL — AB
Specific Gravity, Urine: 1.011 (ref 1.005–1.030)
Squamous Epithelial / HPF: NONE SEEN (ref 0–5)
pH: 5 (ref 5.0–8.0)

## 2019-04-23 LAB — TYPE AND SCREEN
ABO/RH(D): A POS
Antibody Screen: NEGATIVE

## 2019-04-23 LAB — CBC
HCT: 34.1 % — ABNORMAL LOW (ref 39.0–52.0)
Hemoglobin: 10.6 g/dL — ABNORMAL LOW (ref 13.0–17.0)
MCH: 26.4 pg (ref 26.0–34.0)
MCHC: 31.1 g/dL (ref 30.0–36.0)
MCV: 84.8 fL (ref 80.0–100.0)
Platelets: 255 10*3/uL (ref 150–400)
RBC: 4.02 MIL/uL — ABNORMAL LOW (ref 4.22–5.81)
RDW: 17.9 % — ABNORMAL HIGH (ref 11.5–15.5)
WBC: 8.3 10*3/uL (ref 4.0–10.5)
nRBC: 0 % (ref 0.0–0.2)

## 2019-04-23 NOTE — ED Notes (Signed)
Assessment completed by MD.

## 2019-04-23 NOTE — ED Triage Notes (Signed)
PT c/o rectal bleeding since this am. Describes blood as dark red. PT has colonoscopy scheduled for tomorrow. NAD noted

## 2019-04-23 NOTE — Discharge Instructions (Addendum)
Thank you for letting us take care of you in the emergency department today.   Please continue to take any regular, prescribed medications.  If you experience intermittent constipation, please use an over-the-counter bowel regimens, such as MiraLAX or Colace.  Please call the GI doctor to reschedule your colonoscopy, since it is important that you receive the screening exam.  Please follow up with: - The GI doctor to reschedule your colonoscopy - Your primary care doctor to review your ER visit and follow up on your symptoms.   Please return to the ER for any new or worsening symptoms.

## 2019-04-23 NOTE — ED Provider Notes (Signed)
[facility] Emergency Department Provider Note  ____________________________________________   First MD Initiated Contact with Patient 04/23/19 1751     (approximate)  I have reviewed the triage vital signs and the nursing notes.  History  Chief Complaint No chief complaint on file.    HPI Larry Callahan is a 63 y.o. male with history of CAD, DM, diastolic HF, CKD who presents to the emergency department for bloody stool.  Patient states he had 2 bowel movements today where he noticed dark red blood mixed in with brown stool.  Of note, he was recently admitted at the end of October, primarily for COPD exacerbation.  However, during this admission he was evaluated by GI for iron deficiency anemia.  He had an endoscopy done at that time which demonstrated gastritis, and an incomplete colonoscopy due to inadequate prep.  He is scheduled for a colonoscopy tomorrow.  However, he states his sister just canceled the colonoscopy because she is unable to drive him tomorrow.  He is not on any blood thinning medications.  He denies any heavy alcohol or NSAID use. He is prescribed a daily PPI.  He also complains of urinary frequency, which she thinks is related to the Lasix he was recently started on.   Past Medical Hx Past Medical History:  Diagnosis Date  . Allergy   . Coronary artery disease   . Diabetes mellitus without complication ([place])   . Diastolic CHF ([place])   . GERD (gastroesophageal reflux disease)   . Hyperlipidemia   . Hypertension   . Renal insufficiency   . Sleep apnea     Problem List Patient Active Problem List   Diagnosis Date Noted  . Hemoptysis 04/09/2019  . Shortness of breath 03/26/2019  . Uncontrolled type 2 diabetes mellitus with insulin therapy ([place]) 03/12/2019  . Acute on chronic heart failure with preserved ejection fraction (HFpEF) ([place])   . Acute kidney injury superimposed on chronic kidney disease ([place])   . CHF  (congestive heart failure) ([place]) 03/01/2019  . Diastolic dysfunction 11/94/1740  . Iron deficiency anemia 12/31/2018  . Atopic dermatitis 11/24/2018  . Screening for colon cancer 11/06/2017  . Uncontrolled type 2 diabetes mellitus with hyperglycemia ([place]) 11/06/2017  . Dermatitis, unspecified 06/09/2017  . Atherosclerotic heart disease of native coronary artery without angina pectoris 06/09/2017  . 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 Obstructive sleep apnea, adult 06/09/2017  . Morbid (severe) obesity due to excess calories ([place]) 06/09/2017  . Cigarette nicotine dependence with nicotine-induced disorder 06/09/2017  . Type 2 diabetes mellitus with diabetic polyneuropathy ([place]) 06/09/2017  . Allergic rhinitis due to pollen 06/09/2017  . Mixed hyperlipidemia 06/09/2017  . Chronic obstructive pulmonary disease, unspecified ([place]) 06/09/2017  . Dyspnea on exertion 06/09/2017  . Wheezing 06/09/2017  . Dysuria 06/09/2017  . Snoring 06/09/2017  . Essential hypertension 06/09/2017  . Personal history of other malignant neoplasm of kidney 06/09/2017  . Pain in right hip 06/09/2017  . Impacted cerumen, bilateral 06/09/2017  . Secondary hyperparathyroidism, not elsewhere classified ([place]) 06/09/2017  . Type 2 diabetes mellitus with hyperglycemia ([place]) 06/09/2017  . Tinea corporis 06/09/2017    Past Surgical Hx Past Surgical History:  Procedure Laterality Date  . BACK SURGERY    . CHOLECYSTECTOMY    . COLONOSCOPY WITH PROPOFOL N/A 04/12/2019   Procedure: COLONOSCOPY WITH PROPOFOL;  Surgeon: [name], MD;  Location[facility] ENDOSCOPY;  Service: Gastroenterology;  Laterality: N/A;  . ESOPHAGOGASTRODUODENOSCOPY N/A 04/12/2019  Procedure: ESOPHAGOGASTRODUODENOSCOPY (EGD);  Surgeon: [name], MD;  Location: [facility] ENDOSCOPY;  Service: Gastroenterology;  Laterality: N/A;  . left arm surgery    . nephrectomy Left   . PARATHYROIDECTOMY    .  RIGHT HEART CATH N/A 03/29/2019   Procedure: RIGHT HEART CATH;  Surgeon: [name], MD;  Location: [place] [facility];  Service: Cardiovascular;  Laterality: N/A;    Medications Prior to Admission medications   Medication Sig Start Date End Date Taking? Authorizing Provider  acetaminophen (TYLENOL) 325 MG tablet Take 2 tablets (650 mg total) by mouth every 4 (four) hours as needed for headache or mild pain. 03/05/19   Gouru, Illene Silver, MD  albuterol (VENTOLIN HFA) 108 (90 Base) MCG/ACT inhaler Inhale 2 puffs into the lungs every 6 (six) hours as needed for wheezing or shortness of breath. 03/05/19   Nicholes Mango, MD  Alcohol Swabs PADS 1 each. 04/04/18   [provider]  amLODipine (NORVASC) 10 MG tablet Take 1 tablet (10 mg total) by mouth daily. 03/12/19   [name], MD  aspirin EC 81 MG tablet Take 81 mg by mouth daily.    [provider]  Blood Glucose Calibration (TRUE METRIX LEVEL 1) Low SOLN Use as directed 04/04/18   [name], NP  Blood Glucose Monitoring Suppl (TRUE METRIX AIR GLUCOSE METER) DEVI 1 Device by Does not apply route 2 (two) times daily. 04/13/18   [name], NP  clobetasol cream (TEMOVATE) 8.29 % Apply 1 application topically 2 (two) times daily. 12/31/18   [name], NP  ferrous gluconate (FERGON) 324 MG tablet Take 1 tablet (324 mg total) by mouth daily with breakfast. 12/31/18   [name], NP  ferrous sulfate 325 (65 FE) MG EC tablet Take 1 tablet (325 mg total) by mouth 2 (two) times daily with a meal. 04/13/19 05/13/19  Hillary Bow, MD  Fluticasone-Salmeterol (ADVAIR DISKUS) 250-50 MCG/DOSE AEPB Inhale 1 puff into the lungs 2 (two) times daily. 03/05/19 03/04/20  Nicholes Mango, MD  furosemide (LASIX) 80 MG tablet Take 1 tablet (80 mg total) by mouth daily. 04/13/19   Hillary Bow, MD  gemfibrozil (LOPID) 600 MG tablet Take 1 tablet (600 mg total) by mouth 2 (two) times daily with a meal. 02/28/19   [name], NP  glimepiride (AMARYL) 2 MG tablet Take 1 tablet (2 mg total) by mouth daily before breakfast. 03/13/19   [name], NP  glucose blood (TRUE METRIX BLOOD GLUCOSE TEST) test strip Use as instructed twice daily diag E11.65 11/19/18   [name], NP  hydrALAZINE (APRESOLINE) 100 MG tablet Take 1 tablet (100 mg total) by mouth 3 (three) times daily. 03/31/19   Gouru, Illene Silver, MD  Insulin Glargine, 1 Unit Dial, (TOUJEO SOLOSTAR) 300 UNIT/ML SOPN Inject 26 Units into the skin every evening. 03/13/19 06/11/19  [name], NP  ipratropium-albuterol (DUONEB) 0.5-2.5 (3) MG/3ML SOLN Take 3 mLs by nebulization every 6 (six) hours as needed. 03/08/19   Kendell Bane, NP  isosorbide mononitrate (IMDUR) 30 MG 24 hr tablet Take 1 tablet (30 mg total) by mouth 2 (two) times daily. 04/13/19   Hillary Bow, MD  loratadine (CLARITIN) 10 MG tablet Take 1 tablet (10 mg total) by mouth daily. 04/01/19   Nicholes Mango, MD  metoprolol succinate (TOPROL-XL) 50 MG 24 hr tablet Take 1 tablet (50 mg total) by mouth at bedtime. Take with or immediately following a meal. 03/12/19  [name], MD  nicotine (NICODERM CQ - DOSED IN MG/24 HOURS) 21 mg/24hr patch Place 1 patch (21 mg total) onto the skin daily. 12/31/18   [name], NP  NOVOFINE 32G X 6 MM MISC To use with [place] injections daily 12/13/18   [name], NP  omeprazole (PRILOSEC) 40 MG capsule Take 1 capsule (40 mg total) by mouth daily. 03/01/19   Kendell Bane, NP  predniSONE (DELTASONE) 20 MG tablet Take 1 tablet (20 mg total) by mouth daily. 04/13/19   Hillary Bow, MD  tiotropium (SPIRIVA HANDIHALER) 18 MCG inhalation capsule Place 1 capsule (18 mcg total) into inhaler and inhale daily. 04/13/19 05/13/19  Hillary Bow, MD  triamcinolone ointment (KENALOG) 0.1 % Apply 1 application topically 2 (two) times daily. 11/23/18   [name], NP  TRUEPLUS LANCETS 33G MISC Use as directed twice daily diag E11.65  04/04/18   [name], NP    Allergies Penicillins  Family Hx Family History  Problem Relation Age of Onset  . Diabetes Mother   . Lung cancer Father   . Diabetes Sister   . Hypertension Sister   . Heart disease Sister   . Cancer Sister   . Bone cancer Brother     Social Hx Social History   Tobacco Use  . Smoking status: Former Research scientist (life sciences)  . Smokeless tobacco: Former Systems developer    Types: Chew  Substance Use Topics  . Alcohol use: No    Frequency: Never  . Drug use: No     Review of Systems  Constitutional: Negative for fever, chills. Eyes: Negative for visual changes. ENT: Negative for sore throat. Cardiovascular: Negative for chest pain. Respiratory: Negative for shortness of breath. Gastrointestinal: + bloody stool Genitourinary: + frequency Musculoskeletal: Negative for leg swelling. Skin: Negative for rash. Neurological: Negative for for headaches.   Physical Exam  Vital Signs: ED Triage Vitals  Enc Vitals Group     BP 04/23/19 1510 (!) 143/84     Pulse Rate 04/23/19 1510 75     Resp 04/23/19 1510 18     Temp 04/23/19 1510 98.4 F (36.9 C)     Temp Source 04/23/19 1510 Oral     SpO2 04/23/19 1510 95 %     Weight --      Height --      Head Circumference --      Peak Flow --      Pain Score 04/23/19 1508 0     Pain Loc --      Pain Edu? --      Excl. in [place]? --     Constitutional: Alert and oriented.  Head: Normocephalic. Atraumatic. Eyes: Conjunctivae clear. Sclera anicteric. Nose: No congestion. No rhinorrhea. Neck: No stridor.   Cardiovascular: Normal rate. Extremities well perfused. Respiratory: Normal respiratory effort.  Lungs CTAB. Gastrointestinal: Soft. Non-tender. Non-distended.  Rectal: RN chaperone present.  Brown stool on glove.  Guaiac negative.  No hemorrhoids appreciated. Musculoskeletal: No lower extremity edema. No deformities. Neurologic:  Normal speech and language. No gross focal neurologic deficits are appreciated.   Skin: Skin is warm, dry and intact. No rash noted. Psychiatric: Mood and affect are appropriate for situation.  EKG  N/A    Radiology  N/A   Procedures  Procedure(s) performed (including critical care):  Procedures   Initial Impression / Assessment and Plan / ED Course  63 y.o. male who presents to the ED for bloody stools, as above.  He is  not on any anticoagulation, no heavy alcohol or NSAID use.  On exam, rectal reveals brown stool that is guaiac negative.  Labs reveal hemoglobin 10.6, which is actually improved from prior.  Creatinine near baseline.  Urine negative for infection.  Given his negative work-up, feel he is stable for discharge with further outpatient evaluation.  Advised continued adherence to his already prescribed PPI and rescheduling of his colonoscopy. OTC bowel regimen as needed for constipation. Given return precautions.   Final Clinical Impression(s) / ED Diagnosis  Final diagnoses:  Bloody stool       Note:  This document was prepared using Dragon voice recognition software and may include unintentional dictation errors.   Lilia Pro., MD 04/23/19 [phone]

## 2019-04-24 ENCOUNTER — Encounter: Payer: Self-pay | Admitting: Emergency Medicine

## 2019-04-24 ENCOUNTER — Telehealth: Payer: Self-pay | Admitting: Cardiovascular Disease

## 2019-04-24 ENCOUNTER — Emergency Department: Payer: Medicare PPO

## 2019-04-24 ENCOUNTER — Inpatient Hospital Stay
Admission: EM | Admit: 2019-04-24 | Discharge: 2019-04-30 | DRG: 291 | Disposition: A | Discharge status: Home / Self Care | Payer: Medicare PPO | Attending: Internal Medicine | Admitting: Internal Medicine

## 2019-04-24 ENCOUNTER — Ambulatory Visit: Payer: Medicare PPO | Admitting: Gastroenterology

## 2019-04-24 ENCOUNTER — Other Ambulatory Visit: Payer: Self-pay

## 2019-04-24 DIAGNOSIS — Z7951 Long term (current) use of inhaled steroids: Secondary | ICD-10-CM

## 2019-04-24 DIAGNOSIS — I10 Essential (primary) hypertension: Secondary | ICD-10-CM | POA: Diagnosis present

## 2019-04-24 DIAGNOSIS — Z7982 Long term (current) use of aspirin: Secondary | ICD-10-CM

## 2019-04-24 DIAGNOSIS — F17219 Nicotine dependence, cigarettes, with unspecified nicotine-induced disorders: Secondary | ICD-10-CM | POA: Diagnosis present

## 2019-04-24 DIAGNOSIS — Z801 Family history of malignant neoplasm of trachea, bronchus and lung: Secondary | ICD-10-CM

## 2019-04-24 DIAGNOSIS — I509 Heart failure, unspecified: Secondary | ICD-10-CM

## 2019-04-24 DIAGNOSIS — Z91128 Patient's intentional underdosing of medication regimen for other reason: Secondary | ICD-10-CM

## 2019-04-24 DIAGNOSIS — Z79899 Other long term (current) drug therapy: Secondary | ICD-10-CM

## 2019-04-24 DIAGNOSIS — Z905 Acquired absence of kidney: Secondary | ICD-10-CM

## 2019-04-24 DIAGNOSIS — I130 Hypertensive heart and chronic kidney disease with heart failure and stage 1 through stage 4 chronic kidney disease, or unspecified chronic kidney disease: Principal | ICD-10-CM | POA: Diagnosis present

## 2019-04-24 DIAGNOSIS — R350 Frequency of micturition: Secondary | ICD-10-CM | POA: Diagnosis present

## 2019-04-24 DIAGNOSIS — K219 Gastro-esophageal reflux disease without esophagitis: Secondary | ICD-10-CM | POA: Diagnosis present

## 2019-04-24 DIAGNOSIS — E1165 Type 2 diabetes mellitus with hyperglycemia: Secondary | ICD-10-CM | POA: Diagnosis present

## 2019-04-24 DIAGNOSIS — E782 Mixed hyperlipidemia: Secondary | ICD-10-CM | POA: Diagnosis present

## 2019-04-24 DIAGNOSIS — I5032 Chronic diastolic (congestive) heart failure: Secondary | ICD-10-CM

## 2019-04-24 DIAGNOSIS — Z6839 Body mass index (BMI) 39.0-39.9, adult: Secondary | ICD-10-CM

## 2019-04-24 DIAGNOSIS — Z8249 Family history of ischemic heart disease and other diseases of the circulatory system: Secondary | ICD-10-CM

## 2019-04-24 DIAGNOSIS — I5033 Acute on chronic diastolic (congestive) heart failure: Secondary | ICD-10-CM | POA: Diagnosis not present

## 2019-04-24 DIAGNOSIS — I5022 Chronic systolic (congestive) heart failure: Secondary | ICD-10-CM

## 2019-04-24 DIAGNOSIS — K921 Melena: Secondary | ICD-10-CM | POA: Diagnosis present

## 2019-04-24 DIAGNOSIS — Z7952 Long term (current) use of systemic steroids: Secondary | ICD-10-CM

## 2019-04-24 DIAGNOSIS — I5189 Other ill-defined heart diseases: Secondary | ICD-10-CM

## 2019-04-24 DIAGNOSIS — N1832 Chronic kidney disease, stage 3b: Secondary | ICD-10-CM | POA: Diagnosis present

## 2019-04-24 DIAGNOSIS — W19XXXA Unspecified fall, initial encounter: Secondary | ICD-10-CM

## 2019-04-24 DIAGNOSIS — D500 Iron deficiency anemia secondary to blood loss (chronic): Secondary | ICD-10-CM | POA: Diagnosis present

## 2019-04-24 DIAGNOSIS — Z20828 Contact with and (suspected) exposure to other viral communicable diseases: Secondary | ICD-10-CM | POA: Diagnosis present

## 2019-04-24 DIAGNOSIS — Z794 Long term (current) use of insulin: Secondary | ICD-10-CM

## 2019-04-24 DIAGNOSIS — E1122 Type 2 diabetes mellitus with diabetic chronic kidney disease: Secondary | ICD-10-CM | POA: Diagnosis present

## 2019-04-24 DIAGNOSIS — T501X6A Underdosing of loop [high-ceiling] diuretics, initial encounter: Secondary | ICD-10-CM | POA: Diagnosis present

## 2019-04-24 DIAGNOSIS — J441 Chronic obstructive pulmonary disease with (acute) exacerbation: Secondary | ICD-10-CM | POA: Diagnosis present

## 2019-04-24 DIAGNOSIS — R0602 Shortness of breath: Secondary | ICD-10-CM

## 2019-04-24 DIAGNOSIS — R42 Dizziness and giddiness: Secondary | ICD-10-CM | POA: Diagnosis not present

## 2019-04-24 DIAGNOSIS — E119 Type 2 diabetes mellitus without complications: Secondary | ICD-10-CM | POA: Diagnosis present

## 2019-04-24 DIAGNOSIS — Z9111 Patient's noncompliance with dietary regimen: Secondary | ICD-10-CM

## 2019-04-24 DIAGNOSIS — J449 Chronic obstructive pulmonary disease, unspecified: Secondary | ICD-10-CM | POA: Diagnosis present

## 2019-04-24 DIAGNOSIS — G4733 Obstructive sleep apnea (adult) (pediatric): Secondary | ICD-10-CM | POA: Diagnosis present

## 2019-04-24 DIAGNOSIS — K2970 Gastritis, unspecified, without bleeding: Secondary | ICD-10-CM | POA: Diagnosis present

## 2019-04-24 DIAGNOSIS — D509 Iron deficiency anemia, unspecified: Secondary | ICD-10-CM | POA: Diagnosis present

## 2019-04-24 DIAGNOSIS — Z87891 Personal history of nicotine dependence: Secondary | ICD-10-CM

## 2019-04-24 DIAGNOSIS — Z833 Family history of diabetes mellitus: Secondary | ICD-10-CM

## 2019-04-24 DIAGNOSIS — I2510 Atherosclerotic heart disease of native coronary artery without angina pectoris: Secondary | ICD-10-CM | POA: Diagnosis present

## 2019-04-24 DIAGNOSIS — Z880 Allergy status to penicillin: Secondary | ICD-10-CM

## 2019-04-24 LAB — BASIC METABOLIC PANEL WITH GFR
Anion gap: 12 (ref 5–15)
BUN: 43 mg/dL — ABNORMAL HIGH (ref 8–23)
CO2: 24 mmol/L (ref 22–32)
Calcium: 9.5 mg/dL (ref 8.9–10.3)
Chloride: 105 mmol/L (ref 98–111)
Creatinine, Ser: 2.22 mg/dL — ABNORMAL HIGH (ref 0.61–1.24)
GFR calc Af Amer: 35 mL/min — ABNORMAL LOW
GFR calc non Af Amer: 30 mL/min — ABNORMAL LOW
Glucose, Bld: 114 mg/dL — ABNORMAL HIGH (ref 70–99)
Potassium: 3.2 mmol/L — ABNORMAL LOW (ref 3.5–5.1)
Sodium: 141 mmol/L (ref 135–145)

## 2019-04-24 LAB — GLUCOSE, CAPILLARY: Glucose-Capillary: 217 mg/dL — ABNORMAL HIGH (ref 70–99)

## 2019-04-24 LAB — TROPONIN I (HIGH SENSITIVITY): Troponin I (High Sensitivity): 21 ng/L — ABNORMAL HIGH

## 2019-04-24 LAB — CBC
HCT: 35.3 % — ABNORMAL LOW (ref 39.0–52.0)
Hemoglobin: 11 g/dL — ABNORMAL LOW (ref 13.0–17.0)
MCH: 26.4 pg (ref 26.0–34.0)
MCHC: 31.2 g/dL (ref 30.0–36.0)
MCV: 84.9 fL (ref 80.0–100.0)
Platelets: 238 K/uL (ref 150–400)
RBC: 4.16 MIL/uL — ABNORMAL LOW (ref 4.22–5.81)
RDW: 17.9 % — ABNORMAL HIGH (ref 11.5–15.5)
WBC: 8.2 K/uL (ref 4.0–10.5)
nRBC: 0 % (ref 0.0–0.2)

## 2019-04-24 LAB — BRAIN NATRIURETIC PEPTIDE: B Natriuretic Peptide: 88 pg/mL (ref 0.0–100.0)

## 2019-04-24 MED ORDER — ACETAMINOPHEN 325 MG PO TABS
650.0000 mg | ORAL_TABLET | ORAL | Status: DC | PRN
  Administered 2019-04-29: 650 mg via ORAL
  Filled 2019-04-24: qty 2

## 2019-04-24 MED ORDER — FERROUS SULFATE 325 (65 FE) MG PO TABS
325.0000 mg | ORAL_TABLET | Freq: Two times a day (BID) | ORAL | Status: DC
  Administered 2019-04-25 – 2019-04-30 (×11): 325 mg via ORAL
  Filled 2019-04-24 (×11): qty 1

## 2019-04-24 MED ORDER — NICOTINE 21 MG/24HR TD PT24
21.0000 mg | MEDICATED_PATCH | Freq: Every day | TRANSDERMAL | Status: DC
  Filled 2019-04-24 (×5): qty 1

## 2019-04-24 MED ORDER — METOPROLOL SUCCINATE ER 50 MG PO TB24
50.0000 mg | ORAL_TABLET | Freq: Every day | ORAL | Status: DC
  Administered 2019-04-24 – 2019-04-29 (×6): 50 mg via ORAL
  Filled 2019-04-24 (×6): qty 1

## 2019-04-24 MED ORDER — MOMETASONE FURO-FORMOTEROL FUM 200-5 MCG/ACT IN AERO
2.0000 | INHALATION_SPRAY | Freq: Two times a day (BID) | RESPIRATORY_TRACT | Status: DC
  Administered 2019-04-24 – 2019-04-25 (×2): 2 via RESPIRATORY_TRACT
  Filled 2019-04-24: qty 8.8

## 2019-04-24 MED ORDER — TIOTROPIUM BROMIDE MONOHYDRATE 18 MCG IN CAPS
18.0000 ug | ORAL_CAPSULE | Freq: Every day | RESPIRATORY_TRACT | Status: DC
  Administered 2019-04-25 – 2019-04-29 (×5): 18 ug via RESPIRATORY_TRACT
  Filled 2019-04-24 (×2): qty 5

## 2019-04-24 MED ORDER — FUROSEMIDE 10 MG/ML IJ SOLN
60.0000 mg | Freq: Two times a day (BID) | INTRAMUSCULAR | Status: DC
  Administered 2019-04-25: 60 mg via INTRAVENOUS
  Filled 2019-04-24: qty 6

## 2019-04-24 MED ORDER — POTASSIUM CHLORIDE 20 MEQ/15ML (10%) PO SOLN
40.0000 meq | ORAL | Status: AC
  Administered 2019-04-24 (×2): 40 meq via ORAL
  Filled 2019-04-24 (×2): qty 30

## 2019-04-24 MED ORDER — FUROSEMIDE 10 MG/ML IJ SOLN
60.0000 mg | Freq: Once | INTRAMUSCULAR | Status: AC
  Administered 2019-04-24: 60 mg via INTRAVENOUS
  Filled 2019-04-24: qty 8

## 2019-04-24 MED ORDER — AMLODIPINE BESYLATE 10 MG PO TABS
10.0000 mg | ORAL_TABLET | Freq: Every day | ORAL | Status: DC
  Administered 2019-04-25 – 2019-04-30 (×6): 10 mg via ORAL
  Filled 2019-04-24 (×6): qty 1

## 2019-04-24 MED ORDER — HEPARIN SODIUM (PORCINE) 5000 UNIT/ML IJ SOLN
5000.0000 [IU] | Freq: Three times a day (TID) | INTRAMUSCULAR | Status: DC
  Administered 2019-04-24 – 2019-04-30 (×16): 5000 [IU] via SUBCUTANEOUS
  Filled 2019-04-24 (×16): qty 1

## 2019-04-24 MED ORDER — SODIUM CHLORIDE 0.9% FLUSH
3.0000 mL | INTRAVENOUS | Status: DC | PRN
  Administered 2019-04-29: 3 mL via INTRAVENOUS
  Filled 2019-04-24: qty 3

## 2019-04-24 MED ORDER — PANTOPRAZOLE SODIUM 40 MG PO TBEC
40.0000 mg | DELAYED_RELEASE_TABLET | Freq: Every day | ORAL | Status: DC
  Administered 2019-04-25 – 2019-04-30 (×6): 40 mg via ORAL
  Filled 2019-04-24 (×6): qty 1

## 2019-04-24 MED ORDER — ONDANSETRON HCL 4 MG/2ML IJ SOLN: 4.0000 mg | Freq: Four times a day (QID) | INTRAMUSCULAR | Status: DC | PRN

## 2019-04-24 MED ORDER — ALBUTEROL SULFATE (2.5 MG/3ML) 0.083% IN NEBU: 2.5000 mg | INHALATION_SOLUTION | Freq: Four times a day (QID) | RESPIRATORY_TRACT | Status: DC | PRN

## 2019-04-24 MED ORDER — HYDRALAZINE HCL 50 MG PO TABS
50.0000 mg | ORAL_TABLET | Freq: Three times a day (TID) | ORAL | Status: DC
  Administered 2019-04-24 – 2019-04-28 (×11): 50 mg via ORAL
  Filled 2019-04-24 (×11): qty 1

## 2019-04-24 MED ORDER — ISOSORBIDE MONONITRATE ER 30 MG PO TB24
30.0000 mg | ORAL_TABLET | Freq: Two times a day (BID) | ORAL | Status: DC
  Administered 2019-04-24 – 2019-04-30 (×12): 30 mg via ORAL
  Filled 2019-04-24 (×12): qty 1

## 2019-04-24 MED ORDER — SODIUM CHLORIDE 0.9 % IV SOLN: 250.0000 mL | INTRAVENOUS | Status: DC | PRN

## 2019-04-24 MED ORDER — SODIUM CHLORIDE 0.9% FLUSH
3.0000 mL | Freq: Two times a day (BID) | INTRAVENOUS | Status: DC
  Administered 2019-04-24 – 2019-04-29 (×11): 3 mL via INTRAVENOUS

## 2019-04-24 MED ORDER — ACETAMINOPHEN 325 MG PO TABS: 650.0000 mg | ORAL_TABLET | ORAL | Status: DC | PRN

## 2019-04-24 MED ORDER — INSULIN ASPART 100 UNIT/ML ~~LOC~~ SOLN
0.0000 [IU] | Freq: Three times a day (TID) | SUBCUTANEOUS | Status: DC
  Administered 2019-04-25: 2 [IU] via SUBCUTANEOUS
  Administered 2019-04-25: 5 [IU] via SUBCUTANEOUS
  Administered 2019-04-26: 3 [IU] via SUBCUTANEOUS
  Administered 2019-04-26 – 2019-04-27 (×2): 2 [IU] via SUBCUTANEOUS
  Administered 2019-04-27 (×2): 1 [IU] via SUBCUTANEOUS
  Administered 2019-04-28: 7 [IU] via SUBCUTANEOUS
  Administered 2019-04-28 – 2019-04-29 (×3): 2 [IU] via SUBCUTANEOUS
  Administered 2019-04-29: 3 [IU] via SUBCUTANEOUS
  Filled 2019-04-24 (×12): qty 1

## 2019-04-24 MED ORDER — INSULIN GLARGINE 100 UNIT/ML ~~LOC~~ SOLN
20.0000 [IU] | Freq: Every day | SUBCUTANEOUS | Status: DC
  Administered 2019-04-24 – 2019-04-29 (×6): 20 [IU] via SUBCUTANEOUS
  Filled 2019-04-24 (×7): qty 0.2

## 2019-04-24 MED ORDER — ASPIRIN EC 81 MG PO TBEC
81.0000 mg | DELAYED_RELEASE_TABLET | Freq: Every day | ORAL | Status: DC
  Administered 2019-04-25 – 2019-04-30 (×6): 81 mg via ORAL
  Filled 2019-04-24 (×6): qty 1

## 2019-04-24 MED ORDER — INSULIN ASPART 100 UNIT/ML ~~LOC~~ SOLN
0.0000 [IU] | Freq: Every day | SUBCUTANEOUS | Status: DC
  Administered 2019-04-24 – 2019-04-27 (×4): 2 [IU] via SUBCUTANEOUS
  Administered 2019-04-28: 4 [IU] via SUBCUTANEOUS
  Administered 2019-04-29: 2 [IU] via SUBCUTANEOUS
  Filled 2019-04-24 (×6): qty 1

## 2019-04-24 MED ORDER — GEMFIBROZIL 600 MG PO TABS
600.0000 mg | ORAL_TABLET | Freq: Two times a day (BID) | ORAL | Status: DC
  Administered 2019-04-25 – 2019-04-30 (×11): 600 mg via ORAL
  Filled 2019-04-24 (×12): qty 1

## 2019-04-24 NOTE — ED Notes (Signed)
Pt ambulated. Well tolerated, SpO2 maintained between 96-99%

## 2019-04-24 NOTE — Telephone Encounter (Signed)
Spoke with the patient. Patient sts that he is not having chest pain. Patient sts that he is extremely sob. He has taken his COPD medication with no relief. He is not on O2 at home.  Patient sts that he contacted Dr. Laurelyn Sickle office pulmonology and was advised to call EMS.  Patient sounds short of breath on the phone and struggling to complete a sentence. Advised the patient that I agreed with Dr. Laurelyn Sickle office's recommendation for him to call 911. Patient is home alone. Advise him to disconnect the call and call 911 asap.  Patient is agreeable with the plan. Advised the patient that I will fwd the update to Dr. Rockey Situ.

## 2019-04-24 NOTE — ED Triage Notes (Addendum)
Pt in via [facility] from home, complaints of shortness of breath, fatigue since this morning.  Pt here for same recently, admission due to CHF.  NAD noted at this time.

## 2019-04-24 NOTE — ED Notes (Addendum)
ED TO INPATIENT HANDOFF REPORT  ED Nurse Name and Phone #: Karena Addison [phone]  S Name/Age/Gender [place] 63 y.o. male Room/Bed: ED07A/ED07A  Code Status   Code Status: Prior  Home/SNF/Other Home Patient oriented to: self, place, time and situation Is this baseline? Yes   Triage Complete: Triage complete  Chief Complaint sob  Triage Note First Nurse Note:  Arrives via [facility] with c/o SOB>  Per EMS, VSS.    Patient AAOx3.  Skin warm and dry. NAD  Pt in via [facility] from home, complaints of shortness of breath, fatigue since this morning.  Pt here for same recently, admission due to CHF.  NAD noted at this time.   Allergies Allergies  Allergen Reactions  . Penicillins Rash    Level of Care/Admitting Diagnosis ED Disposition    ED Disposition Condition [facility]: [place] [100120]  Level of Care: Telemetry [5]  Covid Evaluation: N/A  Diagnosis: CHF (congestive heart failure) [facility]) [[id number]]  Admitting Physician: Caren Griffins [phone]  Attending Physician: Caren Griffins [[id number]]  PT Class (Do Not Modify): Observation [104]  PT Acc Code (Do Not Modify): Observation [10022]       B Medical/Surgery History Past Medical History:  Diagnosis Date  . Allergy   . Coronary artery disease   . Diabetes mellitus without complication ([place])   . Diastolic CHF ([place])   . GERD (gastroesophageal reflux disease)   . Hyperlipidemia   . Hypertension   . Renal insufficiency   . Sleep apnea    Past Surgical History:  Procedure Laterality Date  . BACK SURGERY    . CHOLECYSTECTOMY    . COLONOSCOPY WITH PROPOFOL N/A 04/12/2019   Procedure: COLONOSCOPY WITH PROPOFOL;  Surgeon: [name], MD;  Location: [facility] ENDOSCOPY;  Service: Gastroenterology;  Laterality: N/A;  . ESOPHAGOGASTRODUODENOSCOPY N/A 04/12/2019   Procedure: ESOPHAGOGASTRODUODENOSCOPY (EGD);  Surgeon: [name], MD;  Location: [facility] ENDOSCOPY;   Service: Gastroenterology;  Laterality: N/A;  . left arm surgery    . nephrectomy Left   . PARATHYROIDECTOMY    . RIGHT HEART CATH N/A 03/29/2019   Procedure: RIGHT HEART CATH;  Surgeon: [name], MD;  Location: [place] CV LAB;  Service: Cardiovascular;  Laterality: N/A;     A IV Location/Drains/Wounds Patient Lines/Drains/Airways Status   Active Line/Drains/Airways    None          Intake/Output Last 24 hours No intake or output data in the 24 hours ending 04/24/19 2006  Labs/Imaging Results for orders placed or performed during the hospital encounter of 04/24/19 (from the past 48 hour(s))  CBC     Status: Abnormal   Collection Time: 04/24/19 11:22 AM  Result Value Ref Range   WBC 8.2 4.0 - 10.5 K/uL   RBC 4.16 (L) 4.22 - 5.81 MIL/uL   Hemoglobin 11.0 (L) 13.0 - 17.0 g/dL   HCT 35.3 (L) 39.0 - 52.0 %   MCV 84.9 80.0 - 100.0 fL   MCH 26.4 26.0 - 34.0 pg   MCHC 31.2 30.0 - 36.0 g/dL   RDW 17.9 (H) 11.5 - 15.5 %   Platelets 238 150 - 400 K/uL   nRBC 0.0 0.0 - 0.2 %    Comment: Performed at [facility], [address] [place], [place] [address]  Basic metabolic panel     Status: Abnormal   Collection Time: 04/24/19 11:22 AM  Result Value Ref Range   Sodium  141 135 - 145 mmol/L   Potassium 3.2 (L) 3.5 - 5.1 mmol/L   Chloride 105 98 - 111 mmol/L   CO2 24 22 - 32 mmol/L   Glucose, Bld 114 (H) 70 - 99 mg/dL   BUN 43 (H) 8 - 23 mg/dL   Creatinine, Ser 2.22 (H) 0.61 - 1.24 mg/dL   Calcium 9.5 8.9 - 10.3 mg/dL   GFR calc non Af Amer 30 (L) >60 mL/min   GFR calc Af Amer 35 (L) >60 mL/min   Anion gap 12 5 - 15    Comment: Performed at [facility], [address] [place], [place] [address]  Brain natriuretic peptide     Status: None   Collection Time: 04/24/19 11:22 AM  Result Value Ref Range   B Natriuretic Peptide 88.0 0.0 - 100.0 pg/mL    Comment: Performed at [facility], [place], [place] [address]   Troponin I (High Sensitivity)     Status: Abnormal   Collection Time: 04/24/19 11:22 AM  Result Value Ref Range   Troponin I (High Sensitivity) 21 (H) <18 ng/L    Comment: (NOTE) Elevated high sensitivity troponin I (hsTnI) values and significant  changes across serial measurements may suggest ACS but many other  chronic and acute conditions are known to elevate hsTnI results.  Refer to the "Links" section for chest pain algorithms and additional  guidance. Performed at [facility], [address] [place], [place] [address]    Dg Chest 2 View  Result Date: 04/24/2019 CLINICAL DATA:  complaints of shortness of breath, fatigue since this morning. Pt here for same recently, admission due to CHF. HTN, CHF, DM, former smoker EXAM: CHEST - 2 VIEW COMPARISON:  04/10/2019 FINDINGS: Mild central pulmonary vascular congestion and mild diffuse interstitial prominent, increased since previous. Heart size upper limits normal. Aortic Atherosclerosis (ICD10-170.0). No effusion. Anterior vertebral endplate spurring at multiple levels in the lower thoracic spine. IMPRESSION: Worsening central pulmonary vascular congestion and interstitial edema. Electronically Signed   By: Lucrezia Europe M.D.   On: 04/24/2019 12:25   Ct Renal Stone Study  Result Date: 04/24/2019 CLINICAL DATA:  Left flank pain. Generalized abdominal pain. Bloody stools. EXAM: CT ABDOMEN AND PELVIS WITHOUT CONTRAST TECHNIQUE: Multidetector CT imaging of the abdomen and pelvis was performed following the standard protocol without IV contrast. COMPARISON:  01/31/2014 CT abdomen/pelvis. FINDINGS: Lower chest: No significant pulmonary nodules or acute consolidative airspace disease. Hepatobiliary: Normal liver size. No liver mass. Cholecystectomy. No biliary ductal dilatation. Pancreas: Normal, with no mass or duct dilation. Spleen: Normal size spleen. Granulomatous calcifications throughout the spleen. No splenic mass. Adrenals/Urinary  Tract: Normal right adrenal. 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 Left nephrectomy with no mass or fluid collection in the left nephrectomy bed. No right hydronephrosis. No right renal stones. Multiple simple right renal cysts, largest 7.7 cm in the posterior lower right kidney. Normal caliber right ureter. No right ureteral stones. Normal bladder with no bladder stones. Stomach/Bowel: Normal non-distended stomach. Normal caliber small bowel with no small bowel wall thickening. Normal appendix. Marked left colonic diverticulosis, with no large bowel wall thickening or significant pericolonic fat stranding. Vascular/Lymphatic: Atherosclerotic nonaneurysmal abdominal aorta. No pathologically enlarged lymph nodes in the abdomen or pelvis. Reproductive: Mildly enlarged prostate. Other: No pneumoperitoneum, ascites or focal fluid collection. Moderate to large fat containing  umbilical hernia, slightly increased from prior. Chronic diastasis of posterior left lateral abdominal wall. Musculoskeletal: No aggressive appearing focal osseous lesions. Moderate thoracolumbar spondylosis. IMPRESSION: 1. No acute abnormality. No evidence of bowel obstruction or acute bowel inflammation. Marked sigmoid diverticulosis, with no evidence of acute diverticulitis. 2. No right hydronephrosis.  No urolithiasis. 3. 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 Given the large size and continued growth, surgical consultation may be considered. 4. Left nephrectomy. 5. Mild prostatomegaly. 6. Moderate to large fat containing umbilical hernia, slightly increased. 7.  Aortic Atherosclerosis (ICD10-I70.0). Electronically Signed   By: Ilona Sorrel M.D.   On: 04/24/2019 16:17    Pending Labs Unresulted Labs (From admission, onward)    Start     Ordered    04/24/19 1540  SARS CORONAVIRUS 2 (TAT 6-24 HRS) Nasopharyngeal Nasopharyngeal Swab  (Asymptomatic/Tier 2 Patients Labs)  Once,   STAT    Question Answer Comment  Is this test for diagnosis or screening Screening   Symptomatic for COVID-19 as defined by CDC No   Hospitalized for COVID-19 No   Admitted to ICU for COVID-19 No   Previously tested for COVID-19 Yes   Resident in a congregate (group) care setting No   Employed in healthcare setting No      04/24/19 1539   Signed and Held  Basic metabolic panel  Daily,   R     Signed and Held          Vitals/Pain Today's Vitals   04/24/19 1536 04/24/19 1645 04/24/19 1648 04/24/19 1700  BP:   (!) 156/90 129/90  Pulse:  84 73 70  Resp:      Temp:      TempSrc:      SpO2:  98% 98% 98%  Weight:      Height:      PainSc: 8        Isolation Precautions No active isolations  Medications Medications  furosemide (LASIX) injection 60 mg (60 mg Intravenous Given 04/24/19 1607)    Mobility walks Low fall risk   Focused Assessments   R Recommendations: See Admitting Provider Note  Report given to: Andria Meuse, RN

## 2019-04-24 NOTE — ED Triage Notes (Signed)
First Nurse Note:  Arrives via [facility] with c/o SOB>  Per EMS, VSS.    Patient AAOx3.  Skin warm and dry. NAD

## 2019-04-24 NOTE — ED Provider Notes (Signed)
[facility] Emergency Department Provider Note  Time seen: 3:41 PM  I have reviewed the triage vital signs and the nursing notes.   HISTORY  Chief Complaint Shortness of Breath   HPI Larry Callahan is a 63 y.o. male with a past medical history of CAD, CHF, gastric reflux, hypertension, hyperlipidemia, obesity, presents to the emergency department for shortness of breath.  According to the patient he was here last night due to possible GI bleed, had a colonoscopy scheduled for today however patient states he did not go he was feeling too weak and short of breath.  Patient states significant shortness of breath much worse with any type of exertion.  Denies any chest pain.  Does state for the past 2 days he has been experiencing some left-sided abdominal pain and thought he had blood in his urine the other day.  Denies any nausea vomiting or diarrhea.  Denies any cough or fever.   Past Medical History:  Diagnosis Date  . Allergy   . Coronary artery disease   . Diabetes mellitus without complication ([place])   . Diastolic CHF ([place])   . GERD (gastroesophageal reflux disease)   . Hyperlipidemia   . Hypertension   . Renal insufficiency   . Sleep apnea     Patient Active Problem List   Diagnosis Date Noted  . Hemoptysis 04/09/2019  . Shortness of breath 03/26/2019  . Uncontrolled type 2 diabetes mellitus with insulin therapy ([place]) 03/12/2019  . Acute on chronic heart failure with preserved ejection fraction (HFpEF) ([place])   . Acute kidney injury superimposed on chronic kidney disease ([place])   . CHF (congestive heart failure) ([place]) 03/01/2019  . Diastolic dysfunction 04/09/2535  . Iron deficiency anemia 12/31/2018  . Atopic dermatitis 11/24/2018  . Screening for colon cancer 11/06/2017  . Uncontrolled type 2 diabetes mellitus with hyperglycemia ([place]) 11/06/2017  . Dermatitis, unspecified 06/09/2017  . Atherosclerotic heart disease of native coronary artery  without angina pectoris 06/09/2017  . 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 Obstructive sleep apnea, adult 06/09/2017  . Morbid (severe) obesity due to excess calories ([place]) 06/09/2017  . Cigarette nicotine dependence with nicotine-induced disorder 06/09/2017  . Type 2 diabetes mellitus with diabetic polyneuropathy ([place]) 06/09/2017  . Allergic rhinitis due to pollen 06/09/2017  . Mixed hyperlipidemia 06/09/2017  . Chronic obstructive pulmonary disease, unspecified ([place]) 06/09/2017  . Dyspnea on exertion 06/09/2017  . Wheezing 06/09/2017  . Dysuria 06/09/2017  . Snoring 06/09/2017  . Essential hypertension 06/09/2017  . Personal history of other malignant neoplasm of kidney 06/09/2017  . Pain in right hip 06/09/2017  . Impacted cerumen, bilateral 06/09/2017  . Secondary hyperparathyroidism, not elsewhere classified ([place]) 06/09/2017  . Type 2 diabetes mellitus with hyperglycemia ([place]) 06/09/2017  . Tinea corporis 06/09/2017    Past Surgical History:  Procedure Laterality Date  . BACK SURGERY    . CHOLECYSTECTOMY    . COLONOSCOPY WITH PROPOFOL N/A 04/12/2019   Procedure: COLONOSCOPY WITH PROPOFOL;  Surgeon: [name], MD;  Location[facility] ENDOSCOPY;  Service: Gastroenterology;  Laterality: N/A;  . ESOPHAGOGASTRODUODENOSCOPY N/A 04/12/2019   Procedure: ESOPHAGOGASTRODUODENOSCOPY (EGD);  Surgeon: [name], MD;  Location: [facility] ENDOSCOPY;  Service: Gastroenterology;  Laterality: N/A;  . left arm surgery    . nephrectomy Left   . PARATHYROIDECTOMY    . RIGHT HEART CATH N/A 03/29/2019   Procedure: RIGHT HEART CATH;  Surgeon: [name], MD;  Location: [facility] INVASIVE CV  LAB;  Service: Cardiovascular;  Laterality: N/A;    Prior to Admission medications   Medication Sig Start Date End Date Taking? Authorizing Provider  acetaminophen (TYLENOL) 325 MG tablet Take 2 tablets (650 mg total) by mouth every 4 (four) hours as needed for  headache or mild pain. 03/05/19   Gouru, Illene Silver, MD  albuterol (VENTOLIN HFA) 108 (90 Base) MCG/ACT inhaler Inhale 2 puffs into the lungs every 6 (six) hours as needed for wheezing or shortness of breath. 03/05/19   Nicholes Mango, MD  Alcohol Swabs PADS 1 each. 04/04/18   [provider]  amLODipine (NORVASC) 10 MG tablet Take 1 tablet (10 mg total) by mouth daily. 03/12/19   [name], MD  aspirin EC 81 MG tablet Take 81 mg by mouth daily.    [provider]  Blood Glucose Calibration (TRUE METRIX LEVEL 1) Low SOLN Use as directed 04/04/18   [name], NP  Blood Glucose Monitoring Suppl (TRUE METRIX AIR GLUCOSE METER) DEVI 1 Device by Does not apply route 2 (two) times daily. 04/13/18   [name], NP  clobetasol cream (TEMOVATE) 9.03 % Apply 1 application topically 2 (two) times daily. 12/31/18   [name], NP  ferrous gluconate (FERGON) 324 MG tablet Take 1 tablet (324 mg total) by mouth daily with breakfast. 12/31/18   [name], NP  ferrous sulfate 325 (65 FE) MG EC tablet Take 1 tablet (325 mg total) by mouth 2 (two) times daily with a meal. 04/13/19 05/13/19  Hillary Bow, MD  Fluticasone-Salmeterol (ADVAIR DISKUS) 250-50 MCG/DOSE AEPB Inhale 1 puff into the lungs 2 (two) times daily. 03/05/19 03/04/20  Nicholes Mango, MD  furosemide (LASIX) 80 MG tablet Take 1 tablet (80 mg total) by mouth daily. 04/13/19   Hillary Bow, MD  gemfibrozil (LOPID) 600 MG tablet Take 1 tablet (600 mg total) by mouth 2 (two) times daily with a meal. 02/28/19   [name], NP  glimepiride (AMARYL) 2 MG tablet Take 1 tablet (2 mg total) by mouth daily before breakfast. 03/13/19   [name], NP  glucose blood (TRUE METRIX BLOOD GLUCOSE TEST) test strip Use as instructed twice daily diag E11.65 11/19/18   [name], NP  hydrALAZINE (APRESOLINE) 100 MG tablet Take 1 tablet (100 mg total) by mouth 3 (three) times daily. 03/31/19   Gouru, Illene Silver, MD   Insulin Glargine, 1 Unit Dial, (TOUJEO SOLOSTAR) 300 UNIT/ML SOPN Inject 26 Units into the skin every evening. 03/13/19 06/11/19  [name], NP  ipratropium-albuterol (DUONEB) 0.5-2.5 (3) MG/3ML SOLN Take 3 mLs by nebulization every 6 (six) hours as needed. 03/08/19   Kendell Bane, NP  isosorbide mononitrate (IMDUR) 30 MG 24 hr tablet Take 1 tablet (30 mg total) by mouth 2 (two) times daily. 04/13/19   Hillary Bow, MD  loratadine (CLARITIN) 10 MG tablet Take 1 tablet (10 mg total) by mouth daily. 04/01/19   Nicholes Mango, MD  metoprolol succinate (TOPROL-XL) 50 MG 24 hr tablet Take 1 tablet (50 mg total) by mouth at bedtime. Take with or immediately following a meal. 03/12/19   Gollan, Kathlene November, MD  nicotine (NICODERM CQ - DOSED IN MG/24 HOURS) 21 mg/24hr patch Place 1 patch (21 mg total) onto the skin daily. 12/31/18   [name], NP  NOVOFINE 32G X 6 MM MISC To use with [place] injections daily 12/13/18   [name], NP  omeprazole (PRILOSEC) 40 MG capsule Take  1 capsule (40 mg total) by mouth daily. 03/01/19   Kendell Bane, NP  predniSONE (DELTASONE) 20 MG tablet Take 1 tablet (20 mg total) by mouth daily. 04/13/19   Hillary Bow, MD  tiotropium (SPIRIVA HANDIHALER) 18 MCG inhalation capsule Place 1 capsule (18 mcg total) into inhaler and inhale daily. 04/13/19 05/13/19  Hillary Bow, MD  triamcinolone ointment (KENALOG) 0.1 % Apply 1 application topically 2 (two) times daily. 11/23/18   [name], NP  TRUEPLUS LANCETS 33G MISC Use as directed twice daily diag E11.65 04/04/18   [name], NP    Allergies  Allergen Reactions  . Penicillins Rash    Family History  Problem Relation Age of Onset  . Diabetes Mother   . Lung cancer Father   . Diabetes Sister   . Hypertension Sister   . Heart disease Sister   . Cancer Sister   . Bone cancer Brother     Social History Social History   Tobacco Use  . Smoking status: Former Research scientist (life sciences)  . Smokeless  tobacco: Former Systems developer    Types: Chew  Substance Use Topics  . Alcohol use: No    Frequency: Never  . Drug use: No    Review of Systems Constitutional: Negative for fever Cardiovascular: Negative for chest pain. Respiratory: Positive for shortness of breath.  Negative for cough. Gastrointestinal: Mild left-sided abdominal pain. Genitourinary: Thinks he saw blood in his urine several days ago. Musculoskeletal: Negative for musculoskeletal complaints Neurological: Negative for headache All other ROS negative  ____________________________________________   PHYSICAL EXAM:  VITAL SIGNS: ED Triage Vitals [04/24/19 1117]  Enc Vitals Group     BP (!) 122/106     Pulse Rate 82     Resp 16     Temp 98.9 F (37.2 C)     Temp Source Oral     SpO2 97 %     Weight 258 lb (117 kg)     Height 5\' 9"  (1.753 m)     Head Circumference      Peak Flow      Pain Score 0     Pain Loc      Pain Edu?      Excl. in [place]?    Constitutional: Alert and oriented. Well appearing and in no distress. Eyes: Normal exam ENT      Head: Normocephalic and atraumatic.      Mouth/Throat: Mucous membranes are moist. Cardiovascular: Normal rate, regular rhythm.  Respiratory: Normal respiratory effort without tachypnea nor retractions. Breath sounds are clear  Gastrointestinal: Soft and nontender. No distention.  Largely benign abdominal exam. Musculoskeletal: Nontender with normal range of motion in all extremities.  Neurologic:  Normal speech and language. No gross focal neurologic deficits Skin:  Skin is warm, dry and intact.  Psychiatric: Mood and affect are normal.   ____________________________________________    EKG  EKG viewed and interpreted by myself shows a normal sinus rhythm 80 bpm the narrow QRS, normal axis, normal intervals, nonspecific ST changes.  ____________________________________________    RADIOLOGY  X-ray shows worsening vascular congestion and interstitial  edema.  ____________________________________________   INITIAL IMPRESSION / ASSESSMENT AND PLAN / ED COURSE  Pertinent labs & imaging results that were available during my care of the patient were reviewed by me and considered in my medical decision making (see chart for details).   Patient presents to the emergency department for worsening shortness of breath over the past 2 to 3 days.  Patient states  that shortness of breath is much worse with any type of exertion.  He also states he was here last night for what he thought was hematuria or possible rectal bleeding as well as left-sided abdominal pain.  Had negative work-up and was discharged home plan to have a colonoscopy today however due to the patient's weakness or shortness of breath he did not go.  Denies any chest pain cough or fever.  Patient is currently satting 97% on room air but continues to feel short of breath throughout her evaluation.  Patient's chest x-ray does show worsening interstitial edema and vascular congestion.  We will dose 60 mg of IV Lasix, I have added on a troponin, we will ambulate the patient and see if he desats with ambulation.  Patient agreeable to plan of care.  Patient satting between 94 to 97% on room air in the emergency department.  Patient continues to state he feels short of breath despite his oxygen levels remaining in the 90s.  Given his worsening chest x-ray slightly elevated troponin and complaints of worsening shortness of breath we will admit the patient for IV diuresis.  Patient has received 60 mg IV Lasix in the emergency department we will discuss with the hospitalist for admission.  Larry Callahan was evaluated in Emergency Department on 04/24/2019 for the symptoms described in the history of present illness. He was evaluated in the context of the global COVID-19 pandemic, which necessitated consideration that the patient might be at risk for infection with the SARS-CoV-2 virus that causes  COVID-19. Institutional protocols and algorithms that pertain to the evaluation of patients at risk for COVID-19 are in a state of rapid change based on information released by regulatory bodies including the CDC and federal and state organizations. These policies and algorithms were followed during the patient's care in the ED.  ____________________________________________   FINAL CLINICAL IMPRESSION(S) / ED DIAGNOSES  Dyspnea CHF exacerbation   Harvest Dark, MD 04/24/19 1715

## 2019-04-24 NOTE — H&P (Signed)
History and Physical   Larry Callahan XFG:[id number] DOB: Jun 25, 1955 DOA: 04/24/2019  I have briefly reviewed the patient's prior medical records in [facility]  PCP: [name], NP  Patient coming from: home  Chief Complaint: shortness of breath   HPI: Larry Callahan is a 63 y.o. male with medical history significant of chronic kidney disease stage IIIb with a baseline creatinine 1.6-2.0, type 2 diabetes mellitus with most recent A1c 6.5 in September 9678, chronic diastolic CHF with most recent echo in September 2020 EF 55 to 60%, tobacco use (quit 3 months ago, COPD, recently hospitalized and discharged 10 days ago for COPD exacerbation, also found at that time to have unexplained iron deficiency anemia for which GI was consulted and underwent an upper endoscopy and colonoscopy (colonoscopy was very poor prep so plans were in place for him to get a colonoscopy as an outpatient).  He currently presents to the hospital with chief complaint of shortness of breath.  Over the last couple of days, he has been feeling more dyspneic with little activity, he is also been complaining of lower extremity swelling.  He reports compliance with his sodium intake however in my evaluation in the ED he is eating a bag of potato chips.  He denies abdominal pain, nausea or vomiting.  He does report small amount of blood in his stool yesterday for which she came to the ED last night but was discharged home.  He denies any fever or chills, no flulike illness.  He denies any sore throat.  He denies any sick contacts.  He denies any chest pain  ED Course: In the emergency room his vital signs are stable, he is afebrile 98.9, blood pressure is stable on the high side, satting 97% on room air.  His blood work reveals a potassium of 3.2, creatinine of 2.2, CBC shows a hemoglobin of 11 and otherwise unremarkable.  His BNP is 88, high-sensitivity troponin x21.  Chest x-ray on admission with vascular  congestion and evidence of early pulmonary edema.  He was given IV Lasix and we are asked to admit  Review of Systems: All systems reviewed, and apart from HPI, all negative  Past Medical History:  Diagnosis Date  . Allergy   . Coronary artery disease   . Diabetes mellitus without complication ([place])   . Diastolic CHF ([place])   . GERD (gastroesophageal reflux disease)   . Hyperlipidemia   . Hypertension   . Renal insufficiency   . Sleep apnea     Past Surgical History:  Procedure Laterality Date  . BACK SURGERY    . CHOLECYSTECTOMY    . COLONOSCOPY WITH PROPOFOL N/A 04/12/2019   Procedure: COLONOSCOPY WITH PROPOFOL;  Surgeon: [name], MD;  Location[facility] ENDOSCOPY;  Service: Gastroenterology;  Laterality: N/A;  . ESOPHAGOGASTRODUODENOSCOPY N/A 04/12/2019   Procedure: ESOPHAGOGASTRODUODENOSCOPY (EGD);  Surgeon: [name], MD;  Location: [facility] ENDOSCOPY;  Service: Gastroenterology;  Laterality: N/A;  . left arm surgery    . nephrectomy Left   . PARATHYROIDECTOMY    . RIGHT HEART CATH N/A 03/29/2019   Procedure: RIGHT HEART CATH;  Surgeon: [name], MD;  Location: [place] CV LAB;  Service: Cardiovascular;  Laterality: N/A;     reports that he has quit smoking. He has quit using smokeless tobacco.  His smokeless tobacco use included chew. He reports that he does not drink alcohol or use drugs.  Allergies  Allergen Reactions  . Penicillins Rash  Family History  Problem Relation Age of Onset  . Diabetes Mother   . Lung cancer Father   . Diabetes Sister   . Hypertension Sister   . Heart disease Sister   . Cancer Sister   . Bone cancer Brother     Prior to Admission medications   Medication Sig Start Date End Date Taking? Authorizing Provider  acetaminophen (TYLENOL) 325 MG tablet Take 2 tablets (650 mg total) by mouth every 4 (four) hours as needed for headache or mild pain. 03/05/19   Gouru, Illene Silver, MD  albuterol (VENTOLIN HFA) 108 (90  Base) MCG/ACT inhaler Inhale 2 puffs into the lungs every 6 (six) hours as needed for wheezing or shortness of breath. 03/05/19   Nicholes Mango, MD  Alcohol Swabs PADS 1 each. 04/04/18   [provider]  amLODipine (NORVASC) 10 MG tablet Take 1 tablet (10 mg total) by mouth daily. 03/12/19   [name], MD  aspirin EC 81 MG tablet Take 81 mg by mouth daily.    [provider]  Blood Glucose Calibration (TRUE METRIX LEVEL 1) Low SOLN Use as directed 04/04/18   [name], NP  Blood Glucose Monitoring Suppl (TRUE METRIX AIR GLUCOSE METER) DEVI 1 Device by Does not apply route 2 (two) times daily. 04/13/18   [name], NP  clobetasol cream (TEMOVATE) 7.09 % Apply 1 application topically 2 (two) times daily. 12/31/18   [name], NP  ferrous gluconate (FERGON) 324 MG tablet Take 1 tablet (324 mg total) by mouth daily with breakfast. 12/31/18   [name], NP  ferrous sulfate 325 (65 FE) MG EC tablet Take 1 tablet (325 mg total) by mouth 2 (two) times daily with a meal. 04/13/19 05/13/19  Hillary Bow, MD  Fluticasone-Salmeterol (ADVAIR DISKUS) 250-50 MCG/DOSE AEPB Inhale 1 puff into the lungs 2 (two) times daily. 03/05/19 03/04/20  Nicholes Mango, MD  furosemide (LASIX) 80 MG tablet Take 1 tablet (80 mg total) by mouth daily. 04/13/19   Hillary Bow, MD  gemfibrozil (LOPID) 600 MG tablet Take 1 tablet (600 mg total) by mouth 2 (two) times daily with a meal. 02/28/19   [name], NP  glimepiride (AMARYL) 2 MG tablet Take 1 tablet (2 mg total) by mouth daily before breakfast. 03/13/19   [name], NP  glucose blood (TRUE METRIX BLOOD GLUCOSE TEST) test strip Use as instructed twice daily diag E11.65 11/19/18   [name], NP  hydrALAZINE (APRESOLINE) 100 MG tablet Take 1 tablet (100 mg total) by mouth 3 (three) times daily. 03/31/19   Gouru, Illene Silver, MD  Insulin Glargine, 1 Unit Dial, (TOUJEO SOLOSTAR) 300 UNIT/ML SOPN Inject 26 Units into  the skin every evening. 03/13/19 06/11/19  [name], NP  ipratropium-albuterol (DUONEB) 0.5-2.5 (3) MG/3ML SOLN Take 3 mLs by nebulization every 6 (six) hours as needed. 03/08/19   Kendell Bane, NP  isosorbide mononitrate (IMDUR) 30 MG 24 hr tablet Take 1 tablet (30 mg total) by mouth 2 (two) times daily. 04/13/19   Hillary Bow, MD  loratadine (CLARITIN) 10 MG tablet Take 1 tablet (10 mg total) by mouth daily. 04/01/19   Nicholes Mango, MD  metoprolol succinate (TOPROL-XL) 50 MG 24 hr tablet Take 1 tablet (50 mg total) by mouth at bedtime. Take with or immediately following a meal. 03/12/19   Gollan, Kathlene November, MD  nicotine (NICODERM CQ - DOSED IN MG/24 HOURS) 21 mg/24hr patch Place 1 patch (21 mg  total) onto the skin daily. 12/31/18   [name], NP  NOVOFINE 32G X 6 MM MISC To use with [place] injections daily 12/13/18   [name], NP  omeprazole (PRILOSEC) 40 MG capsule Take 1 capsule (40 mg total) by mouth daily. 03/01/19   Kendell Bane, NP  predniSONE (DELTASONE) 20 MG tablet Take 1 tablet (20 mg total) by mouth daily. 04/13/19   Hillary Bow, MD  tiotropium (SPIRIVA HANDIHALER) 18 MCG inhalation capsule Place 1 capsule (18 mcg total) into inhaler and inhale daily. 04/13/19 05/13/19  Hillary Bow, MD  triamcinolone ointment (KENALOG) 0.1 % Apply 1 application topically 2 (two) times daily. 11/23/18   [name], NP  TRUEPLUS LANCETS 33G MISC Use as directed twice daily diag E11.65 04/04/18   [name], NP    Physical Exam: Vitals:   04/24/19 1533 04/24/19 1534 04/24/19 1645 04/24/19 1648  BP: (!) 173/82   (!) 156/90  Pulse:  72 84 73  Resp:      Temp:      TempSrc:      SpO2:  97% 98% 98%  Weight:      Height:       Constitutional: NAD, calm, comfortable Eyes: PERRL, lids and conjunctivae normal ENMT: Mucous membranes are moist. Posterior pharynx clear of any exudate or lesions.Normal dentition.  Neck: normal, supple Respiratory: Bibasilar  crackles noted, no wheezing.  Normal respiratory effort.  Not using accessory muscles Cardiovascular: Regular rate and rhythm, no murmurs / rubs / gallops.  1+ pitting lower extremity edema. 2+ pedal pulses.  Abdomen: no tenderness, no masses palpated. Bowel sounds positive.  Musculoskeletal: no clubbing / cyanosis. Normal muscle tone.  Skin: no rashes, lesions, ulcers. No induration Neurologic: CN 2-12 grossly intact. Strength 5/5 in all 4.  Psychiatric: Normal judgment and insight. Alert and oriented x 3. Normal mood.   Labs on Admission: I have personally reviewed following labs and imaging studies  CBC: Recent Labs  Lab 04/23/19 1515 04/24/19 1122  WBC 8.3 8.2  HGB 10.6* 11.0*  HCT 34.1* 35.3*  MCV 84.8 84.9  PLT 255 497   Basic Metabolic Panel: Recent Labs  Lab 04/23/19 1515 04/24/19 1122  NA 143 141  K 3.0* 3.2*  CL 107 105  CO2 23 24  GLUCOSE 96 114*  BUN 45* 43*  CREATININE 2.01* 2.22*  CALCIUM 9.6 9.5   Liver Function Tests: Recent Labs  Lab 04/23/19 1515  AST 22  ALT 18  ALKPHOS 58  BILITOT 0.5  PROT 7.3  ALBUMIN 3.3*   Coagulation Profile: No results for input(s): INR, PROTIME in the last 168 hours. BNP (last 3 results) No results for input(s): PROBNP in the last 8760 hours. CBG: No results for input(s): GLUCAP in the last 168 hours. Thyroid Function Tests: No results for input(s): TSH, T4TOTAL, FREET4, T3FREE, THYROIDAB in the last 72 hours. Urine analysis:    Component Value Date/Time   COLORURINE YELLOW (A) 04/23/2019 1852   APPEARANCEUR CLEAR (A) 04/23/2019 1852   APPEARANCEUR Cloudy (A) 11/23/2018 1103   LABSPEC 1.011 04/23/2019 1852   LABSPEC 1.005 03/06/2013 0929   PHURINE 5.0 04/23/2019 1852   GLUCOSEU NEGATIVE 04/23/2019 1852   GLUCOSEU Negative 03/06/2013 0929   HGBUR NEGATIVE 04/23/2019 1852   BILIRUBINUR NEGATIVE 04/23/2019 1852   BILIRUBINUR negative 02/20/2019 1404   BILIRUBINUR Negative 11/23/2018 1103   BILIRUBINUR  Negative 03/06/2013 [place] 04/23/2019 1852   PROTEINUR 100 (A) 04/23/2019 1852  UROBILINOGEN 0.2 02/20/2019 1404   NITRITE NEGATIVE 04/23/2019 1852   LEUKOCYTESUR NEGATIVE 04/23/2019 1852   LEUKOCYTESUR Negative 03/06/2013 0929     Radiological Exams on Admission: Dg Chest 2 View  Result Date: 04/24/2019 CLINICAL DATA:  complaints of shortness of breath, fatigue since this morning. Pt here for same recently, admission due to CHF. HTN, CHF, DM, former smoker EXAM: CHEST - 2 VIEW COMPARISON:  04/10/2019 FINDINGS: Mild central pulmonary vascular congestion and mild diffuse interstitial prominent, increased since previous. Heart size upper limits normal. Aortic Atherosclerosis (ICD10-170.0). No effusion. Anterior vertebral endplate spurring at multiple levels in the lower thoracic spine. IMPRESSION: Worsening central pulmonary vascular congestion and interstitial edema. Electronically Signed   By: Lucrezia Europe M.D.   On: 04/24/2019 12:25   Ct Renal Stone Study  Result Date: 04/24/2019 CLINICAL DATA:  Left flank pain. Generalized abdominal pain. Bloody stools. EXAM: CT ABDOMEN AND PELVIS WITHOUT CONTRAST TECHNIQUE: Multidetector CT imaging of the abdomen and pelvis was performed following the standard protocol without IV contrast. COMPARISON:  01/31/2014 CT abdomen/pelvis. FINDINGS: Lower chest: No significant pulmonary nodules or acute consolidative airspace disease. Hepatobiliary: Normal liver size. No liver mass. Cholecystectomy. No biliary ductal dilatation. Pancreas: Normal, with no mass or duct dilation. Spleen: Normal size spleen. Granulomatous calcifications throughout the spleen. No splenic mass. Adrenals/Urinary Tract: Normal right adrenal. 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 Left nephrectomy with no mass or fluid collection in the left nephrectomy bed. No  right hydronephrosis. No right renal stones. Multiple simple right renal cysts, largest 7.7 cm in the posterior lower right kidney. Normal caliber right ureter. No right ureteral stones. Normal bladder with no bladder stones. Stomach/Bowel: Normal non-distended stomach. Normal caliber small bowel with no small bowel wall thickening. Normal appendix. Marked left colonic diverticulosis, with no large bowel wall thickening or significant pericolonic fat stranding. Vascular/Lymphatic: Atherosclerotic nonaneurysmal abdominal aorta. No pathologically enlarged lymph nodes in the abdomen or pelvis. Reproductive: Mildly enlarged prostate. Other: No pneumoperitoneum, ascites or focal fluid collection. Moderate to large fat containing umbilical hernia, slightly increased from prior. Chronic diastasis of posterior left lateral abdominal wall. Musculoskeletal: No aggressive appearing focal osseous lesions. Moderate thoracolumbar spondylosis. IMPRESSION: 1. No acute abnormality. No evidence of bowel obstruction or acute bowel inflammation. Marked sigmoid diverticulosis, with no evidence of acute diverticulitis. 2. No right hydronephrosis.  No urolithiasis. 3. 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 Given the large size and continued growth, surgical consultation may be considered. 4. Left nephrectomy. 5. Mild prostatomegaly. 6. Moderate to large fat containing umbilical hernia, slightly increased. 7.  Aortic Atherosclerosis (ICD10-I70.0). Electronically Signed   By: Ilona Sorrel M.D.   On: 04/24/2019 16:17    EKG: Independently reviewed. Sinus rhythm   Assessment/Plan Active Problems:   Morbid (severe) obesity due to excess calories (HCC)   Cigarette nicotine dependence with nicotine-induced disorder   Mixed hyperlipidemia   Chronic obstructive pulmonary disease, unspecified (HCC)   Essential hypertension   Uncontrolled type 2 diabetes  mellitus with hyperglycemia (HCC)   Diastolic dysfunction   Iron deficiency anemia   CHF (congestive heart failure) (HCC)   Acute on chronic heart failure with preserved ejection fraction (HFpEF) (HCC)   Principal Problem Acute on chronic diastolic CHF, dyspnea due to pulmonary edema -Patient with evidence of fluid  overload with crackles on exam, chest x-ray showing vascular congestion as well as lower extremity edema -I am doubtful about his compliance with low-sodium diet -Patient was given IV Lasix in the ED, continue, continue daily weights, strict ins and outs -Most recent 2D echo was done just 2 months ago, will not repeat, it showed an EF 55 to 60% with elevated left ventricular end-diastolic pressure. -Continue home beta-blockers, Imdur, hydralazine  Active Problems Chronic kidney disease stage IIIb -Baseline creatinine 1.6-2.0, currently 2.2, this has been stable over the last week, continue to closely monitor with IV Lasix and avoid nephrotoxins -Monitor urine output -Flank pain to the ED provider, will CT renal stone study done in the ED without acute abnormalities  Type 2 diabetes mellitus -Hold home oral agents, continue home long-acting insulin, placed on sliding scale  Iron deficiency anemia, history of recent concern for GI bleed -His hemoglobin currently stable, he underwent an endoscopy and colonoscopy during his prior stay in the hospital 10 days ago, and was supposed to have a repeat colonoscopy as an outpatient.  With normal hemoglobin this can be worked up as an outpatient.  If he develops significant melena will call GI in-house -Continue home iron supplementation  Essential hypertension -Continue home medications, given with diuresis will decrease home hydralazine to half the dose  COPD -No wheezing, this appears stable, recently hospitalized for COPD exacerbation and finishing a prednisone taper.  Continue home medications, hold further prednisone dose to  avoid hyperglycemia  Coronary artery disease -Continue Imdur, home medications   DVT prophylaxis: heparin  Code Status: Full code  Family Communication: no family at bedside  Disposition Plan: home when ready  Bed Type: telemetry Consults called: none  Obs/Inp: Observation   Marzetta Board, MD, PhD [facility]  Contact via [email or web address]  04/24/2019, 5:42 PM

## 2019-04-24 NOTE — Telephone Encounter (Signed)
Patient sister calling  States patient called her and said he was having chest pain - did not give many details  Wanted to call office to see if Dr Rockey Situ had any available appointments - nothing available at this time   Please call patient to discuss or advise to go to ED

## 2019-04-24 NOTE — ED Notes (Signed)
Pt sister called and updated on pt status

## 2019-04-25 DIAGNOSIS — Z7982 Long term (current) use of aspirin: Secondary | ICD-10-CM | POA: Diagnosis not present

## 2019-04-25 DIAGNOSIS — I5033 Acute on chronic diastolic (congestive) heart failure: Secondary | ICD-10-CM | POA: Diagnosis present

## 2019-04-25 DIAGNOSIS — Z20828 Contact with and (suspected) exposure to other viral communicable diseases: Secondary | ICD-10-CM | POA: Diagnosis present

## 2019-04-25 DIAGNOSIS — Z9111 Patient's noncompliance with dietary regimen: Secondary | ICD-10-CM | POA: Diagnosis not present

## 2019-04-25 DIAGNOSIS — Z833 Family history of diabetes mellitus: Secondary | ICD-10-CM | POA: Diagnosis not present

## 2019-04-25 DIAGNOSIS — F17219 Nicotine dependence, cigarettes, with unspecified nicotine-induced disorders: Secondary | ICD-10-CM | POA: Diagnosis not present

## 2019-04-25 DIAGNOSIS — I5189 Other ill-defined heart diseases: Secondary | ICD-10-CM | POA: Diagnosis not present

## 2019-04-25 DIAGNOSIS — K219 Gastro-esophageal reflux disease without esophagitis: Secondary | ICD-10-CM | POA: Diagnosis present

## 2019-04-25 DIAGNOSIS — I509 Heart failure, unspecified: Secondary | ICD-10-CM | POA: Diagnosis not present

## 2019-04-25 DIAGNOSIS — J441 Chronic obstructive pulmonary disease with (acute) exacerbation: Secondary | ICD-10-CM | POA: Diagnosis present

## 2019-04-25 DIAGNOSIS — I130 Hypertensive heart and chronic kidney disease with heart failure and stage 1 through stage 4 chronic kidney disease, or unspecified chronic kidney disease: Secondary | ICD-10-CM | POA: Diagnosis present

## 2019-04-25 DIAGNOSIS — I2510 Atherosclerotic heart disease of native coronary artery without angina pectoris: Secondary | ICD-10-CM | POA: Diagnosis present

## 2019-04-25 DIAGNOSIS — I5032 Chronic diastolic (congestive) heart failure: Secondary | ICD-10-CM | POA: Diagnosis not present

## 2019-04-25 DIAGNOSIS — J449 Chronic obstructive pulmonary disease, unspecified: Secondary | ICD-10-CM | POA: Diagnosis not present

## 2019-04-25 DIAGNOSIS — D500 Iron deficiency anemia secondary to blood loss (chronic): Secondary | ICD-10-CM | POA: Diagnosis present

## 2019-04-25 DIAGNOSIS — N183 Chronic kidney disease, stage 3 unspecified: Secondary | ICD-10-CM | POA: Diagnosis not present

## 2019-04-25 DIAGNOSIS — Z79899 Other long term (current) drug therapy: Secondary | ICD-10-CM | POA: Diagnosis not present

## 2019-04-25 DIAGNOSIS — K921 Melena: Secondary | ICD-10-CM | POA: Diagnosis present

## 2019-04-25 DIAGNOSIS — N1832 Chronic kidney disease, stage 3b: Secondary | ICD-10-CM | POA: Diagnosis present

## 2019-04-25 DIAGNOSIS — E1122 Type 2 diabetes mellitus with diabetic chronic kidney disease: Secondary | ICD-10-CM | POA: Diagnosis present

## 2019-04-25 DIAGNOSIS — E1165 Type 2 diabetes mellitus with hyperglycemia: Secondary | ICD-10-CM | POA: Diagnosis present

## 2019-04-25 DIAGNOSIS — E782 Mixed hyperlipidemia: Secondary | ICD-10-CM | POA: Diagnosis present

## 2019-04-25 DIAGNOSIS — R350 Frequency of micturition: Secondary | ICD-10-CM | POA: Diagnosis present

## 2019-04-25 DIAGNOSIS — G4733 Obstructive sleep apnea (adult) (pediatric): Secondary | ICD-10-CM | POA: Diagnosis present

## 2019-04-25 DIAGNOSIS — Z7951 Long term (current) use of inhaled steroids: Secondary | ICD-10-CM | POA: Diagnosis not present

## 2019-04-25 DIAGNOSIS — R0602 Shortness of breath: Secondary | ICD-10-CM | POA: Diagnosis not present

## 2019-04-25 DIAGNOSIS — Z7952 Long term (current) use of systemic steroids: Secondary | ICD-10-CM | POA: Diagnosis not present

## 2019-04-25 DIAGNOSIS — Z880 Allergy status to penicillin: Secondary | ICD-10-CM | POA: Diagnosis not present

## 2019-04-25 DIAGNOSIS — E669 Obesity, unspecified: Secondary | ICD-10-CM | POA: Diagnosis not present

## 2019-04-25 DIAGNOSIS — R42 Dizziness and giddiness: Secondary | ICD-10-CM | POA: Diagnosis not present

## 2019-04-25 DIAGNOSIS — K2970 Gastritis, unspecified, without bleeding: Secondary | ICD-10-CM | POA: Diagnosis present

## 2019-04-25 DIAGNOSIS — I10 Essential (primary) hypertension: Secondary | ICD-10-CM | POA: Diagnosis not present

## 2019-04-25 LAB — BASIC METABOLIC PANEL WITH GFR
Anion gap: 8 (ref 5–15)
BUN: 44 mg/dL — ABNORMAL HIGH (ref 8–23)
CO2: 25 mmol/L (ref 22–32)
Calcium: 8.8 mg/dL — ABNORMAL LOW (ref 8.9–10.3)
Chloride: 107 mmol/L (ref 98–111)
Creatinine, Ser: 2.23 mg/dL — ABNORMAL HIGH (ref 0.61–1.24)
GFR calc Af Amer: 35 mL/min — ABNORMAL LOW
GFR calc non Af Amer: 30 mL/min — ABNORMAL LOW
Glucose, Bld: 103 mg/dL — ABNORMAL HIGH (ref 70–99)
Potassium: 3.7 mmol/L (ref 3.5–5.1)
Sodium: 140 mmol/L (ref 135–145)

## 2019-04-25 LAB — SARS CORONAVIRUS 2 (TAT 6-24 HRS): SARS Coronavirus 2: NEGATIVE

## 2019-04-25 LAB — GLUCOSE, CAPILLARY
Glucose-Capillary: 78 mg/dL (ref 70–99)
Glucose-Capillary: 254 mg/dL — ABNORMAL HIGH (ref 70–99)
Glucose-Capillary: 195 mg/dL — ABNORMAL HIGH (ref 70–99)
Glucose-Capillary: 232 mg/dL — ABNORMAL HIGH (ref 70–99)

## 2019-04-25 MED ORDER — IPRATROPIUM-ALBUTEROL 0.5-2.5 (3) MG/3ML IN SOLN
3.0000 mL | Freq: Four times a day (QID) | RESPIRATORY_TRACT | Status: DC
  Administered 2019-04-25 – 2019-04-26 (×3): 3 mL via RESPIRATORY_TRACT
  Filled 2019-04-25 (×3): qty 3

## 2019-04-25 NOTE — Plan of Care (Signed)
  Problem: Clinical Measurements: Goal: Ability to maintain clinical measurements within normal limits will improve Outcome: Progressing Goal: Respiratory complications will improve Outcome: Progressing   Problem: Activity: Goal: Risk for activity intolerance will decrease Outcome: Progressing   Problem: Education: Goal: Ability to demonstrate management of disease process will improve Outcome: Progressing Goal: Ability to verbalize understanding of medication therapies will improve Outcome: Progressing

## 2019-04-25 NOTE — Progress Notes (Signed)
PROGRESS NOTE  Larry Callahan DDU:[id number] DOB: 1955-10-21 DOA: 04/24/2019 PCP: [name], NP   LOS: 0 days   Brief Narrative / Interim history: 63 year old male with chronic kidney disease stage IIIb with baseline creatinine around 2.0, type 2 diabetes mellitus, OSA on CPAP, COPD, chronic diastolic CHF, longstanding tobacco use and quit 3 months ago who was admitted on 04/24/2019 with acute on chronic diastolic heart failure and significant dyspnea with minimal activities  Subjective / 24h Interval events: Patient is feeling a little bit better today, however far off from his baseline.  Normally he can ambulate several 100 feet, but now gets short of breath even walking 10 to 20 feet.  He was barely able to walk a few steps in the hallway and had to come back to his room.  Assessment & Plan: Principal Problem Acute on chronic dyspnea, likely multifactorial due to acute on chronic diastolic CHF, also a degree of pulmonary edema on admission, underlying COPD, OSA, obesity and longstanding tobacco use -Has had dyspnea for few months but worsened in the last few days.  He is quite frustrated that he cannot feel like he was in the past.  Long discussion with the patient at bedside today that his dyspnea is multifactorial, and he is getting treatment for each disease process that contributes however may have a degree of dyspnea at baseline -He was evaluated by cardiology with a right heart cath just a month ago that did not show any significant volume overload.  He was volume overloaded during this admission given lower extremity edema, crackles and pulmonary edema on the chest x-ray, likely in the setting of dietary nonadherence -He is being followed by pulmonology as an outpatient as well -Started on IV Lasix, diuresed excellent overnight, he is swelling looks improved today, has received Lasix today but will hold tonight's dose given chronic kidney disease -He remains quite  dyspneic and continues to require close inpatient monitoring as he is high risk for decompensation -Adjust nebulizers, continue to monitor renal function, respiratory status.  May need oxygen at home.  Active Problems Chronic kidney disease stage IIIb -His creatinine has progressively climbed over the last several months, now in the 2 range.  Stable with Lasix, continue to monitor.  Would probably benefit from nephrology as an outpatient  Type 2 diabetes mellitus -Continue sliding scale and long-acting insulin  Iron deficiency anemia, history of GI bleed -Hemoglobin stable, continue PPI, continue iron supplementation  Hypertension -Continue medications as below  COPD -No wheezing  Coronary artery disease -Continue Imdur, home medications   Scheduled Meds:  amLODipine  10 mg Oral Daily   aspirin EC  81 mg Oral Daily   ferrous sulfate  325 mg Oral BID WC   gemfibrozil  600 mg Oral BID WC   heparin  5,000 Units Subcutaneous Q8H   hydrALAZINE  50 mg Oral TID   insulin aspart  0-5 Units Subcutaneous QHS   insulin aspart  0-9 Units Subcutaneous TID WC   insulin glargine  20 Units Subcutaneous QHS   ipratropium-albuterol  3 mL Nebulization Q6H   isosorbide mononitrate  30 mg Oral BID   metoprolol succinate  50 mg Oral QHS   nicotine  21 mg Transdermal Daily   pantoprazole  40 mg Oral Daily   sodium chloride flush  3 mL Intravenous Q12H   tiotropium  18 mcg Inhalation Daily   Continuous Infusions:  sodium chloride     PRN Meds:.sodium chloride, acetaminophen, albuterol, ondansetron (ZOFRAN)  IV, sodium chloride flush  DVT prophylaxis: heparin Code Status: Full code Family Communication: d/w patient Disposition Plan: home when ready   Consultants:  None   Procedures:  None   Microbiology  None   Antimicrobials: None     Objective: Vitals:   04/24/19 1930 04/24/19 2053 04/25/19 0316 04/25/19 0807  BP: (!) 171/87 (!) 155/78 (!) 114/59 (!) 154/88   Pulse: 77 70 64 (!) 59  Resp:  20 20 19   Temp:  98.4 F (36.9 C) 98.4 F (36.9 C) 98.2 F (36.8 C)  TempSrc:  Oral Oral   SpO2: 96% 93% 97% 95%  Weight:   117.5 kg   Height:        Intake/Output Summary (Last 24 hours) at 04/25/2019 1442 Last data filed at 04/25/2019 1300 Gross per 24 hour  Intake 720 ml  Output 1855 ml  Net -1135 ml   Filed Weights   04/24/19 1117 04/25/19 0316  Weight: 117 kg 117.5 kg    Examination:  Constitutional: NAD Eyes: lids and conjunctivae normal, no scleral icterus ENMT: Mucous membranes are moist.  Neck: normal, supple Respiratory: diminished at the bases, no wheezing, tachypneic Cardiovascular: Regular rate and rhythm, no murmurs / rubs / gallops. Trace LE edema. Good peripheral pulses Abdomen: no tenderness. Bowel sounds positive.  Musculoskeletal: no clubbing / cyanosis.  Skin: no rashes Neurologic: CN 2-12 grossly intact. Strength 5/5 in all 4.  Psychiatric: Normal judgment and insight. Alert and oriented x 3. Normal mood.    Data Reviewed: I have independently reviewed following labs and imaging studies   CBC: Recent Labs  Lab 04/23/19 1515 04/24/19 1122  WBC 8.3 8.2  HGB 10.6* 11.0*  HCT 34.1* 35.3*  MCV 84.8 84.9  PLT 255 811   Basic Metabolic Panel: Recent Labs  Lab 04/23/19 1515 04/24/19 1122 04/25/19 0443  NA 143 141 140  K 3.0* 3.2* 3.7  CL 107 105 107  CO2 23 24 25   GLUCOSE 96 114* 103*  BUN 45* 43* 44*  CREATININE 2.01* 2.22* 2.23*  CALCIUM 9.6 9.5 8.8*   GFR: Estimated Creatinine Clearance: 41.8 mL/min (A) (by C-G formula based on SCr of 2.23 mg/dL (H)). Liver Function Tests: Recent Labs  Lab 04/23/19 1515  AST 22  ALT 18  ALKPHOS 58  BILITOT 0.5  PROT 7.3  ALBUMIN 3.3*   Coagulation Profile: No results for input(s): INR, PROTIME in the last 168 hours. HbA1C: No results for input(s): HGBA1C in the last 72 hours. CBG: Recent Labs  Lab 04/24/19 2102 04/25/19 0806 04/25/19 1153  GLUCAP  217* 78 254*    Recent Results (from the past 240 hour(s))  SARS CORONAVIRUS 2 (TAT 6-24 HRS) Nasopharyngeal Nasopharyngeal Swab     Status: None   Collection Time: 04/24/19  4:13 PM   Specimen: Nasopharyngeal Swab  Result Value Ref Range Status   SARS Coronavirus 2 NEGATIVE NEGATIVE Final    Comment: (NOTE) SARS-CoV-2 target nucleic acids are NOT DETECTED. The SARS-CoV-2 RNA is generally detectable in upper and lower respiratory specimens during the acute phase of infection. Negative results do not preclude SARS-CoV-2 infection, do not rule out co-infections with other pathogens, and should not be used as the sole basis for treatment or other patient management decisions. Negative results must be combined with clinical observations, patient history, and epidemiological information. The expected result is Negative. Fact Sheet for Patients: SugarRoll.be Fact Sheet for Healthcare Providers: https://www.woods-mathews.com/ This test is not yet approved or cleared by the [place]  States FDA and  has been authorized for detection and/or diagnosis of SARS-CoV-2 by FDA under an Emergency Use Authorization (EUA). This EUA will remain  in effect (meaning this test can be used) for the duration of the COVID-19 declaration under Section 56 4(b)(1) of the Act, 21 U.S.C. section 360bbb-3(b)(1), unless the authorization is terminated or revoked sooner. Performed at [facility], [place] [address], [place], [place] [address]      Radiology Studies: Dg Chest 2 View  Result Date: 04/24/2019 CLINICAL DATA:  complaints of shortness of breath, fatigue since this morning. Pt here for same recently, admission due to CHF. HTN, CHF, DM, former smoker EXAM: CHEST - 2 VIEW COMPARISON:  04/10/2019 FINDINGS: Mild central pulmonary vascular congestion and mild diffuse interstitial prominent, increased since previous. Heart size upper limits normal. Aortic  Atherosclerosis (ICD10-170.0). No effusion. Anterior vertebral endplate spurring at multiple levels in the lower thoracic spine. IMPRESSION: Worsening central pulmonary vascular congestion and interstitial edema. Electronically Signed   By: Lucrezia Europe M.D.   On: 04/24/2019 12:25   Ct Renal Stone Study  Result Date: 04/24/2019 CLINICAL DATA:  Left flank pain. Generalized abdominal pain. Bloody stools. EXAM: CT ABDOMEN AND PELVIS WITHOUT CONTRAST TECHNIQUE: Multidetector CT imaging of the abdomen and pelvis was performed following the standard protocol without IV contrast. COMPARISON:  01/31/2014 CT abdomen/pelvis. FINDINGS: Lower chest: No significant pulmonary nodules or acute consolidative airspace disease. Hepatobiliary: Normal liver size. No liver mass. Cholecystectomy. No biliary ductal dilatation. Pancreas: Normal, with no mass or duct dilation. Spleen: Normal size spleen. Granulomatous calcifications throughout the spleen. No splenic mass. Adrenals/Urinary Tract: Normal right adrenal. 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 Left nephrectomy with no mass or fluid collection in the left nephrectomy bed. No right hydronephrosis. No right renal stones. Multiple simple right renal cysts, largest 7.7 cm in the posterior lower right kidney. Normal caliber right ureter. No right ureteral stones. Normal bladder with no bladder stones. Stomach/Bowel: Normal non-distended stomach. Normal caliber small bowel with no small bowel wall thickening. Normal appendix. Marked left colonic diverticulosis, with no large bowel wall thickening or significant pericolonic fat stranding. Vascular/Lymphatic: Atherosclerotic nonaneurysmal abdominal aorta. No pathologically enlarged lymph nodes in the abdomen or pelvis. Reproductive: Mildly enlarged prostate. Other: No pneumoperitoneum, ascites or focal fluid collection.  Moderate to large fat containing umbilical hernia, slightly increased from prior. Chronic diastasis of posterior left lateral abdominal wall. Musculoskeletal: No aggressive appearing focal osseous lesions. Moderate thoracolumbar spondylosis. IMPRESSION: 1. No acute abnormality. No evidence of bowel obstruction or acute bowel inflammation. Marked sigmoid diverticulosis, with no evidence of acute diverticulitis. 2. No right hydronephrosis.  No urolithiasis. 3. 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 Given the large size and continued growth, surgical consultation may be considered. 4. Left nephrectomy. 5. Mild prostatomegaly. 6. Moderate to large fat containing umbilical hernia, slightly increased. 7.  Aortic Atherosclerosis (ICD10-I70.0). Electronically Signed   By: Ilona Sorrel M.D.   On: 04/24/2019 16:17     Time spent: 35 minutes, > 50% bedside in 2 separate visits discussing his multiple disease processes and how they impact his breathing and treatment plans   Marzetta Board, MD, PhD [facility]  Contact via  [email or web address]

## 2019-04-26 LAB — CBC
HCT: 30.8 % — ABNORMAL LOW (ref 39.0–52.0)
Hemoglobin: 9.6 g/dL — ABNORMAL LOW (ref 13.0–17.0)
MCH: 26.8 pg (ref 26.0–34.0)
MCHC: 31.2 g/dL (ref 30.0–36.0)
MCV: 86 fL (ref 80.0–100.0)
Platelets: 190 10*3/uL (ref 150–400)
RBC: 3.58 MIL/uL — ABNORMAL LOW (ref 4.22–5.81)
RDW: 17.4 % — ABNORMAL HIGH (ref 11.5–15.5)
WBC: 5.8 10*3/uL (ref 4.0–10.5)
nRBC: 0 % (ref 0.0–0.2)

## 2019-04-26 LAB — GLUCOSE, CAPILLARY
Glucose-Capillary: 111 mg/dL — ABNORMAL HIGH (ref 70–99)
Glucose-Capillary: 231 mg/dL — ABNORMAL HIGH (ref 70–99)
Glucose-Capillary: 154 mg/dL — ABNORMAL HIGH (ref 70–99)
Glucose-Capillary: 209 mg/dL — ABNORMAL HIGH (ref 70–99)

## 2019-04-26 LAB — BASIC METABOLIC PANEL
Anion gap: 10 (ref 5–15)
BUN: 43 mg/dL — ABNORMAL HIGH (ref 8–23)
CO2: 24 mmol/L (ref 22–32)
Calcium: 8.6 mg/dL — ABNORMAL LOW (ref 8.9–10.3)
Chloride: 106 mmol/L (ref 98–111)
Creatinine, Ser: 2.12 mg/dL — ABNORMAL HIGH (ref 0.61–1.24)
GFR calc Af Amer: 37 mL/min — ABNORMAL LOW (ref >60)
GFR calc non Af Amer: 32 mL/min — ABNORMAL LOW (ref >60)
Glucose, Bld: 146 mg/dL — ABNORMAL HIGH (ref 70–99)
Potassium: 3.5 mmol/L (ref 3.5–5.1)
Sodium: 140 mmol/L (ref 135–145)

## 2019-04-26 MED ORDER — IPRATROPIUM-ALBUTEROL 0.5-2.5 (3) MG/3ML IN SOLN
3.0000 mL | Freq: Two times a day (BID) | RESPIRATORY_TRACT | Status: DC
  Administered 2019-04-26 – 2019-04-29 (×6): 3 mL via RESPIRATORY_TRACT
  Filled 2019-04-26 (×6): qty 3

## 2019-04-26 MED ORDER — HYDROXYZINE HCL 10 MG PO TABS
10.0000 mg | ORAL_TABLET | Freq: Three times a day (TID) | ORAL | Status: DC | PRN
  Administered 2019-04-26: 10 mg via ORAL
  Filled 2019-04-26 (×3): qty 1

## 2019-04-26 MED ORDER — FUROSEMIDE 10 MG/ML IJ SOLN
40.0000 mg | Freq: Every day | INTRAMUSCULAR | Status: DC
  Administered 2019-04-26 – 2019-04-27 (×2): 40 mg via INTRAVENOUS
  Filled 2019-04-26 (×2): qty 4

## 2019-04-26 NOTE — Telephone Encounter (Signed)
Shortness of breath is chronic issue Needs home oxygen to be set up by pulmonary for COPD Needs dramatic weight loss

## 2019-04-26 NOTE — Progress Notes (Signed)
PROGRESS NOTE  Larry Callahan RFF:[id number] DOB: 08-28-1955 DOA: 04/24/2019 PCP: [name], NP   LOS: 1 day   Brief Narrative / Interim history: 63 year old male with chronic kidney disease stage IIIb with baseline creatinine around 2.0, type 2 diabetes mellitus, OSA on CPAP, COPD, chronic diastolic CHF, longstanding tobacco use and quit 3 months ago who was admitted on 04/24/2019 with acute on chronic diastolic heart failure and significant dyspnea with minimal activities  Subjective / 24h Interval events: Small improvement but quite dyspneic still.  Denies any chest pain  Assessment & Plan: Principal Problem Acute on chronic dyspnea, likely multifactorial due to acute on chronic diastolic CHF, also a degree of pulmonary edema on admission, underlying COPD, OSA, obesity and longstanding tobacco use -Has had dyspnea for few months but worsened in the last few days.  He is quite frustrated that he cannot feel like he was in the past.  Long discussion with the patient at bedside today that his dyspnea is multifactorial, and he is getting treatment for each disease process that contributes however may have a degree of dyspnea at baseline -He was evaluated by cardiology with a right heart cath just a month ago that did not show any significant volume overload.  He was volume overloaded during this admission given lower extremity edema, crackles and pulmonary edema on the chest x-ray, likely in the setting of dietary nonadherence -He is being followed by pulmonology as an outpatient as well -He was initially placed on Lasix, this was held last night and creatinine has remained stable, resume Lasix today -Remains quite dyspneic, will obtain a 2D echo since has been 4 months since last one, of note he had a high risk stress test a month ago and at that time EF was evaluated around 30% but that was not confirmed with a follow-up echo.  Active Problems Chronic kidney disease stage IIIb  -His creatinine has progressively climbed over the last several months, now in the 2 range.  Stable with Lasix, would probably benefit to establish care with nephrology as an outpatient  Type 2 diabetes mellitus -Continue sliding scale and long-acting insulin  CBG (last 3)  Recent Labs    04/25/19 2104 04/26/19 0808 04/26/19 1149  GLUCAP 232* 111* 231*    Iron deficiency anemia, history of GI bleed -Hemoglobin overall stable  Hypertension -Continue medications as below  COPD -No wheezing  Coronary artery disease -Continue Imdur, home medications   Scheduled Meds: . amLODipine  10 mg Oral Daily  . aspirin EC  81 mg Oral Daily  . ferrous sulfate  325 mg Oral BID WC  . furosemide  40 mg Intravenous Daily  . gemfibrozil  600 mg Oral BID WC  . heparin  5,000 Units Subcutaneous Q8H  . hydrALAZINE  50 mg Oral TID  . insulin aspart  0-5 Units Subcutaneous QHS  . insulin aspart  0-9 Units Subcutaneous TID WC  . insulin glargine  20 Units Subcutaneous QHS  . ipratropium-albuterol  3 mL Nebulization BID  . isosorbide mononitrate  30 mg Oral BID  . metoprolol succinate  50 mg Oral QHS  . nicotine  21 mg Transdermal Daily  . pantoprazole  40 mg Oral Daily  . sodium chloride flush  3 mL Intravenous Q12H  . tiotropium  18 mcg Inhalation Daily   Continuous Infusions: . sodium chloride     PRN Meds:.sodium chloride, acetaminophen, albuterol, ondansetron (ZOFRAN) IV, sodium chloride flush  DVT prophylaxis: heparin Code Status: Full code  Family Communication: d/w patient Disposition Plan: home when ready   Consultants:  None   Procedures:  None   Microbiology  None   Antimicrobials: None     Objective: Vitals:   04/25/19 2029 04/26/19 0509 04/26/19 0807 04/26/19 0814  BP: (!) 124/55 122/74 (!) 152/74   Pulse: 72 (!) 57 (!) 56   Resp:  20    Temp: 98.1 F (36.7 C) (!) 97.4 F (36.3 C) 97.7 F (36.5 C)   TempSrc: Oral Oral Oral   SpO2: 97% 97% 98% 98%   Weight:  118.3 kg    Height:        Intake/Output Summary (Last 24 hours) at 04/26/2019 1352 Last data filed at 04/26/2019 1100 Gross per 24 hour  Intake 720 ml  Output 1495 ml  Net -775 ml   Filed Weights   04/24/19 1117 04/25/19 0316 04/26/19 0509  Weight: 117 kg 117.5 kg 118.3 kg    Examination:  Constitutional: No distress, eating lunch Eyes: No scleral icterus seen ENMT: mmm Neck: normal, supple Respiratory: Moves air well, overall clear slightly diminished at the bases but no wheezing or crackles heard. Cardiovascular: Regular rate and rhythm, no murmurs.  Trace edema.  Good pulses Abdomen: Soft, nontender, nondistended, positive bowel sounds Musculoskeletal: no clubbing / cyanosis.  Skin: No rashes seen Neurologic: No focal deficits, equal strength Psychiatric: Normal judgment and insight. Alert and oriented x 3. Normal mood.    Data Reviewed: I have independently reviewed following labs and imaging studies   CBC: Recent Labs  Lab 04/23/19 1515 04/24/19 1122 04/26/19 0552  WBC 8.3 8.2 5.8  HGB 10.6* 11.0* 9.6*  HCT 34.1* 35.3* 30.8*  MCV 84.8 84.9 86.0  PLT 255 238 811   Basic Metabolic Panel: Recent Labs  Lab 04/23/19 1515 04/24/19 1122 04/25/19 0443 04/26/19 0552  NA 143 141 140 140  K 3.0* 3.2* 3.7 3.5  CL 107 105 107 106  CO2 23 24 25 24   GLUCOSE 96 114* 103* 146*  BUN 45* 43* 44* 43*  CREATININE 2.01* 2.22* 2.23* 2.12*  CALCIUM 9.6 9.5 8.8* 8.6*   GFR: Estimated Creatinine Clearance: 44.1 mL/min (A) (by C-G formula based on SCr of 2.12 mg/dL (H)). Liver Function Tests: Recent Labs  Lab 04/23/19 1515  AST 22  ALT 18  ALKPHOS 58  BILITOT 0.5  PROT 7.3  ALBUMIN 3.3*   Coagulation Profile: No results for input(s): INR, PROTIME in the last 168 hours. HbA1C: No results for input(s): HGBA1C in the last 72 hours. CBG: Recent Labs  Lab 04/25/19 1153 04/25/19 1620 04/25/19 2104 04/26/19 0808 04/26/19 1149  GLUCAP 254* 195* 232*  111* 231*    Recent Results (from the past 240 hour(s))  SARS CORONAVIRUS 2 (TAT 6-24 HRS) Nasopharyngeal Nasopharyngeal Swab     Status: None   Collection Time: 04/24/19  4:13 PM   Specimen: Nasopharyngeal Swab  Result Value Ref Range Status   SARS Coronavirus 2 NEGATIVE NEGATIVE Final    Comment: (NOTE) SARS-CoV-2 target nucleic acids are NOT DETECTED. The SARS-CoV-2 RNA is generally detectable in upper and lower respiratory specimens during the acute phase of infection. Negative results do not preclude SARS-CoV-2 infection, do not rule out co-infections with other pathogens, and should not be used as the sole basis for treatment or other patient management decisions. Negative results must be combined with clinical observations, patient history, and epidemiological information. The expected result is Negative. Fact Sheet for Patients: SugarRoll.be Fact Sheet for Healthcare Providers:  https://www.woods-mathews.com/ This test is not yet approved or cleared by the [place] and  has been authorized for detection and/or diagnosis of SARS-CoV-2 by FDA under an Emergency Use Authorization (EUA). This EUA will remain  in effect (meaning this test can be used) for the duration of the COVID-19 declaration under Section 56 4(b)(1) of the Act, 21 U.S.C. section 360bbb-3(b)(1), unless the authorization is terminated or revoked sooner. Performed at [facility], [place] [address] [place], [place] [address]      Radiology Studies: Ct Renal Stone Study  Result Date: 04/24/2019 CLINICAL DATA:  Left flank pain. Generalized abdominal pain. Bloody stools. EXAM: CT ABDOMEN AND PELVIS WITHOUT CONTRAST TECHNIQUE: Multidetector CT imaging of the abdomen and pelvis was performed following the standard protocol without IV contrast. COMPARISON:  01/31/2014 CT abdomen/pelvis. FINDINGS: Lower chest: No significant pulmonary nodules or acute  consolidative airspace disease. Hepatobiliary: Normal liver size. No liver mass. Cholecystectomy. No biliary ductal dilatation. Pancreas: Normal, with no mass or duct dilation. Spleen: Normal size spleen. Granulomatous calcifications throughout the spleen. No splenic mass. Adrenals/Urinary Tract: Normal right adrenal. 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 Left nephrectomy with no mass or fluid collection in the left nephrectomy bed. No right hydronephrosis. No right renal stones. Multiple simple right renal cysts, largest 7.7 cm in the posterior lower right kidney. Normal caliber right ureter. No right ureteral stones. Normal bladder with no bladder stones. Stomach/Bowel: Normal non-distended stomach. Normal caliber small bowel with no small bowel wall thickening. Normal appendix. Marked left colonic diverticulosis, with no large bowel wall thickening or significant pericolonic fat stranding. Vascular/Lymphatic: Atherosclerotic nonaneurysmal abdominal aorta. No pathologically enlarged lymph nodes in the abdomen or pelvis. Reproductive: Mildly enlarged prostate. Other: No pneumoperitoneum, ascites or focal fluid collection. Moderate to large fat containing umbilical hernia, slightly increased from prior. Chronic diastasis of posterior left lateral abdominal wall. Musculoskeletal: No aggressive appearing focal osseous lesions. Moderate thoracolumbar spondylosis. IMPRESSION: 1. No acute abnormality. No evidence of bowel obstruction or acute bowel inflammation. Marked sigmoid diverticulosis, with no evidence of acute diverticulitis. 2. No right hydronephrosis.  No urolithiasis. 3. 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 Given the large size and continued growth, surgical consultation may be considered. 4. Left nephrectomy.  5. Mild prostatomegaly. 6. Moderate to large fat containing umbilical hernia, slightly increased. 7.  Aortic Atherosclerosis (ICD10-I70.0). Electronically Signed   By: Ilona Sorrel M.D.   On: 04/24/2019 16:17     Time spent: 35 minutes, > 50% bedside in 2 separate visits discussing his multiple disease processes and how they impact his breathing and treatment plans   Marzetta Board, MD, PhD [facility]  Contact via  [email or web address]

## 2019-04-26 NOTE — Plan of Care (Signed)
  Problem: Education: Goal: Knowledge of General Education information will improve Description: Including pain rating scale, medication(s)/side effects and non-pharmacologic comfort measures Outcome: Not Progressing Note: Patient refused to view heart failure videos, nor was he able to describe appropriate care.

## 2019-04-26 NOTE — Progress Notes (Signed)
Cardiovascular and Pulmonary Nurse Navigator Note:    63 year old male with chronic kidney disease stage III, DM Type II, obstructive sleep apnea on CPAP, chronic obstructive pulmonary disease, chronic diastolic congestive heart failure who presented with shortness of breath.  On admission patient with lower extremity edema, crackles, and pulmonary edema on chest x-ray likely in the setting of dietary non-adherence.    NOTE:  Heart Failure is not a new diagnosis for this patient.   Patient had scales given to him by [facility] during his September 2020 hospitalization, but does not weigh daily.  Reviewed the importance of weighing daily and tracking his weight.   *Reviewed with patient the following information: *Discussed when to call the Dr= weight gain of >2-3lb overnight of 5lb in a week,  *Discussed yellow zone= call MD: weight gain of >2-3lb overnight of 5lb in a week, increased swelling, increased SOB when lying down, chest discomfort, dizziness, increased fatigue  *Red Zone= call 911: struggle to breath, fainting or near fainting, significant chest pain ?   *Diet - is currently on heart healthy / carb modified diet.   *Fluid restriction - Patient not currently on a fluid restriction, advised patient no more than 64 ounces per day.    *Smoking Cessation - Patient is a FORMER smoker.  Patient reports having quit 4 months ago.    Rounded on patient to discuss Pulmonary  Rehab.  Note:  Patient's pulmonologist is Dr. Devona Konig.  Overview of the program provided.  Brochure and informational letter with CPT billing codes provided to patient.  Patient is  interested in participating.  Patient's sister would need to bring patient.  Patient does not drive.  Patient understands we will contact him in one to two weeks after discharge to schedule his first appointment.    [place] patient appointment scheduled for 05/16/2019 at 10:30 a.m.  Patient was previously scheduled as a new  patient on 03/28/2019. Appointment was cancelled due to patient being hospitalized at that time.Will reach out to Darylene Price, [place] with the [facility] to evaluate patient for [facility] Para-[facility] for ongoing support with education / assessment on Heart Failure.    Roanna Epley, RN, BSN, [facility]  Cardiovascular & Pulmonary Nurse Navigator  Direct Line: [phone]  Department Phone #: [phone] Fax: [phone]  Email Address: [email or web address]

## 2019-04-27 ENCOUNTER — Inpatient Hospital Stay (HOSPITAL_COMMUNITY)
Admit: 2019-04-27 | Discharge: 2019-04-27 | Disposition: A | Discharge status: Home / Self Care | Payer: Medicare PPO | Attending: Internal Medicine | Admitting: Internal Medicine

## 2019-04-27 DIAGNOSIS — R0609 Other forms of dyspnea: Secondary | ICD-10-CM

## 2019-04-27 DIAGNOSIS — R0602 Shortness of breath: Secondary | ICD-10-CM

## 2019-04-27 LAB — TROPONIN I (HIGH SENSITIVITY)
Troponin I (High Sensitivity): 14 ng/L (ref <18)
Troponin I (High Sensitivity): 13 ng/L (ref <18)

## 2019-04-27 LAB — BASIC METABOLIC PANEL
Anion gap: 10 (ref 5–15)
BUN: 44 mg/dL — ABNORMAL HIGH (ref 8–23)
CO2: 26 mmol/L (ref 22–32)
Calcium: 9 mg/dL (ref 8.9–10.3)
Chloride: 106 mmol/L (ref 98–111)
Creatinine, Ser: 2.09 mg/dL — ABNORMAL HIGH (ref 0.61–1.24)
GFR calc Af Amer: 37 mL/min — ABNORMAL LOW (ref >60)
GFR calc non Af Amer: 32 mL/min — ABNORMAL LOW (ref >60)
Glucose, Bld: 170 mg/dL — ABNORMAL HIGH (ref 70–99)
Potassium: 4 mmol/L (ref 3.5–5.1)
Sodium: 142 mmol/L (ref 135–145)

## 2019-04-27 LAB — ECHOCARDIOGRAM COMPLETE
Height: 69 in
Weight: 4198.4 oz

## 2019-04-27 LAB — GLUCOSE, CAPILLARY
Glucose-Capillary: 149 mg/dL — ABNORMAL HIGH (ref 70–99)
Glucose-Capillary: 135 mg/dL — ABNORMAL HIGH (ref 70–99)
Glucose-Capillary: 178 mg/dL — ABNORMAL HIGH (ref 70–99)
Glucose-Capillary: 229 mg/dL — ABNORMAL HIGH (ref 70–99)

## 2019-04-27 MED ORDER — ALBUTEROL SULFATE (2.5 MG/3ML) 0.083% IN NEBU
2.5000 mg | INHALATION_SOLUTION | RESPIRATORY_TRACT | Status: DC | PRN
  Administered 2019-04-30: 2.5 mg via RESPIRATORY_TRACT
  Filled 2019-04-27: qty 3

## 2019-04-27 MED ORDER — PREDNISONE 20 MG PO TABS
40.0000 mg | ORAL_TABLET | Freq: Every day | ORAL | Status: DC
  Administered 2019-04-28 – 2019-04-30 (×3): 40 mg via ORAL
  Filled 2019-04-27 (×3): qty 2

## 2019-04-27 MED ORDER — PERFLUTREN LIPID MICROSPHERE
1.0000 mL | INTRAVENOUS | Status: AC | PRN
  Administered 2019-04-27: 5 mL via INTRAVENOUS
  Filled 2019-04-27: qty 10

## 2019-04-27 NOTE — TOC Initial Note (Signed)
Transition of Care [facility]) - Initial/Assessment Note    Patient Details  Name: Larry Callahan MRN: [id number] Date of Birth: [birth year]  Transition of [facility]) CM/SW Contact:    Latanya Maudlin, RN Phone Number: 04/27/2019, 9:25 AM  Clinical Narrative:  Larry Callahan team consulted to assist with disposition and complete heart failure assessment. Patient tells me he is unsure if he has heart failure and tells me "all I know is I see a heart Doctor but I got so much going on that I wouldn't be surprised."  Previous RNCM documentation show patient has been given scales and used home health in the past through [place]. Patient reports he does not need HH at this time. Independent with all ADL's. Sister is able to provide transport.  Per MD patient has required acute O2 and may need to be discharged on chronic O2. Has COPD and CHF diagnosis.                Expected Discharge Plan: Home/Self Care Barriers to Discharge: No Barriers Identified   Patient Goals and CMS Choice     Choice offered to / list presented to : Patient  Expected Discharge Plan and Services Expected Discharge Plan: Home/Self Care   Discharge Planning Services: CM Consult Post Acute Care Choice: Durable Medical Equipment, Home Health Living arrangements for the past 2 months: Apartment                                      Prior Living Arrangements/Services Living arrangements for the past 2 months: Apartment Lives with:: Self              Current home services: DME    Activities of Daily Living Home Assistive Devices/Equipment: CBG Meter, CPAP ADL Screening (condition at time of admission) Patient's cognitive ability adequate to safely complete daily activities?: Yes Is the patient deaf or have difficulty hearing?: No Does the patient have difficulty seeing, even when wearing glasses/contacts?: No Does the patient have difficulty concentrating, remembering, or making decisions?:  No Patient able to express need for assistance with ADLs?: Yes Does the patient have difficulty dressing or bathing?: No Independently performs ADLs?: Yes (appropriate for developmental age) Does the patient have difficulty walking or climbing stairs?: Yes Weakness of Legs: None Weakness of Arms/Hands: None  Permission Sought/Granted                  Emotional Assessment              Admission diagnosis:  SOB (shortness of breath) [R06.02] Congestive heart failure, unspecified HF chronicity, unspecified heart failure type (Larry Callahan) [I50.9] Patient Active Problem List   Diagnosis Date Noted  . Hemoptysis 04/09/2019  . Shortness of breath 03/26/2019  . Uncontrolled type 2 diabetes mellitus with insulin therapy ([place]) 03/12/2019  . Acute on chronic heart failure with preserved ejection fraction (HFpEF) ([place])   . Acute kidney injury superimposed on chronic kidney disease ([place])   . CHF (congestive heart failure) ([place]) 03/01/2019  . Diastolic dysfunction 60/03/9322  . Iron deficiency anemia 12/31/2018  . Atopic dermatitis 11/24/2018  . Screening for colon cancer 11/06/2017  . Uncontrolled type 2 diabetes mellitus with hyperglycemia ([place]) 11/06/2017  . Dermatitis, unspecified 06/09/2017  . Atherosclerotic heart disease of native coronary artery without angina pectoris 06/09/2017  . 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 Obstructive sleep apnea,  adult 06/09/2017  . Morbid (severe) obesity due to excess calories ([place]) 06/09/2017  . Cigarette nicotine dependence with nicotine-induced disorder 06/09/2017  . Type 2 diabetes mellitus with diabetic polyneuropathy ([place]) 06/09/2017  . Allergic rhinitis due to pollen 06/09/2017  . Mixed hyperlipidemia 06/09/2017  . Chronic obstructive pulmonary disease, unspecified ([place]) 06/09/2017  . Dyspnea on exertion 06/09/2017  . Wheezing 06/09/2017  . Dysuria 06/09/2017  . Snoring 06/09/2017  . Essential  hypertension 06/09/2017  . Personal history of other malignant neoplasm of kidney 06/09/2017  . Pain in right hip 06/09/2017  . Impacted cerumen, bilateral 06/09/2017  . Secondary hyperparathyroidism, not elsewhere classified ([place]) 06/09/2017  . Type 2 diabetes mellitus with hyperglycemia ([place]) 06/09/2017  . Tinea corporis 06/09/2017   PCP:  [name], NP Pharmacy:   [facility], [place] [address] Phone: [phone] Fax: [facility] [place], [place] [address] Phone: [phone] Fax: [phone]     Social Determinants of Health (SDOH) Interventions    Readmission Risk Interventions Readmission Risk Prevention Plan 04/27/2019  Transportation Screening Complete  Medication Review ([place]) Complete  PCP or Specialist appointment within 3-5 days of discharge Complete  HRI or [place] Complete  SW Recovery Care/Counseling Consult Not Complete  SW Consult Not Complete Comments not needed  Palliative Care Screening Not [place] Not Applicable  Some recent data might be hidden

## 2019-04-27 NOTE — Progress Notes (Signed)
PROGRESS NOTE  Larry Callahan TKP:[id number] DOB: 09-02-1955 DOA: 04/24/2019 PCP: [name], NP   LOS: 2 days   Brief Narrative / Interim history: 63 year old male with chronic kidney disease stage IIIb with baseline creatinine around 2.0, type 2 diabetes mellitus, OSA on CPAP, COPD, chronic diastolic CHF, longstanding tobacco use and quit 3 months ago who was admitted on 04/24/2019 with acute on chronic diastolic heart failure and significant dyspnea with minimal activities  Subjective / 24h Interval events: Today he is complaining of chest pain, complains of persistent shortness of breath and even when I am in the room he seems to be gasping  Assessment & Plan: Principal Problem Acute on chronic dyspnea, likely multifactorial due to acute on chronic diastolic CHF, also a degree of pulmonary edema on admission, underlying COPD, OSA, obesity and longstanding tobacco use -Has had dyspnea for few months but worsened in the last few days.  He is quite frustrated that he cannot feel like he was in the past.  Long discussion with the patient on admission that his dyspnea is multifactorial, and he is getting treatment for each disease process that contributes however may have a degree of dyspnea at baseline that may never be reversed -He was evaluated by cardiology with a right heart cath just a month ago that did not show any significant volume overload.  He was volume overloaded during this admission given lower extremity edema, crackles and pulmonary edema on the chest x-ray, likely in the setting of dietary nonadherence -He is being followed by pulmonology as an outpatient as well -Continue Lasix, he is diuresing well and renal function has remained stable -Despite adequate diuresis he remains quite dyspneic, 2D echo was obtained today which showed an EF of 45-50%, compared with the EF of 55 to 60% in September 2020.  Discussed with the reading cardiologist Dr. Fletcher Anon and today's  echo was somewhat suboptimal. -Patient reevaluated in the afternoon and at this time he describes chest pain, states is substernal, and he is also feeling short of breath.  Active Problems Chronic kidney disease stage IIIb -His creatinine has progressively climbed over the last several months, now in the 2 range. -Creatinine has remained stable with Lasix, continue to monitor BMP  Type 2 diabetes mellitus -Continue sliding scale and long-acting insulin  CBG (last 3)  Recent Labs    04/26/19 2050 04/27/19 0739 04/27/19 1142  GLUCAP 209* 149* 135*    Iron deficiency anemia, history of GI bleed -Hemoglobin overall stable  Hypertension -Continue medications as below  COPD -No wheezing  Coronary artery disease -Continue Imdur, continue home medications. -As above, chest pain today, obtain EKG and cycle cardiac enzymes   Scheduled Meds:  amLODipine  10 mg Oral Daily   aspirin EC  81 mg Oral Daily   ferrous sulfate  325 mg Oral BID WC   furosemide  40 mg Intravenous Daily   gemfibrozil  600 mg Oral BID WC   heparin  5,000 Units Subcutaneous Q8H   hydrALAZINE  50 mg Oral TID   insulin aspart  0-5 Units Subcutaneous QHS   insulin aspart  0-9 Units Subcutaneous TID WC   insulin glargine  20 Units Subcutaneous QHS   ipratropium-albuterol  3 mL Nebulization BID   isosorbide mononitrate  30 mg Oral BID   metoprolol succinate  50 mg Oral QHS   nicotine  21 mg Transdermal Daily   pantoprazole  40 mg Oral Daily   [START ON 04/28/2019] predniSONE  40  mg Oral Q breakfast   sodium chloride flush  3 mL Intravenous Q12H   tiotropium  18 mcg Inhalation Daily   Continuous Infusions:  sodium chloride     PRN Meds:.sodium chloride, acetaminophen, albuterol, hydrOXYzine, ondansetron (ZOFRAN) IV, sodium chloride flush  DVT prophylaxis: heparin Code Status: Full code Family Communication: d/w patient Disposition Plan: home when ready   Consultants:  None    Procedures:  None   Microbiology  None   Antimicrobials: None     Objective: Vitals:   04/26/19 2304 04/27/19 0513 04/27/19 0742 04/27/19 0821  BP:  (!) 144/76 (!) 145/74   Pulse: 67 63 (!) 57 (!) 55  Resp: 18  19 18   Temp:  97.8 F (36.6 C) 97.9 F (36.6 C)   TempSrc:  Oral    SpO2: 96% 97% 97% 97%  Weight:  119 kg    Height:        Intake/Output Summary (Last 24 hours) at 04/27/2019 1539 Last data filed at 04/27/2019 1149 Gross per 24 hour  Intake 240 ml  Output 3225 ml  Net -2985 ml   Filed Weights   04/25/19 0316 04/26/19 0509 04/27/19 0513  Weight: 117.5 kg 118.3 kg 119 kg    Examination:  Constitutional: No apparent distress, sitting in bed Eyes: no icterus ENMT: mmm Neck: normal, supple Respiratory: cta biL, no wheezing Cardiovascular: rrr, no mrg Abdomen: soft, nt, nd, BS+ Musculoskeletal: no clubbing / cyanosis.  Skin: No rashes seen Neurologic: non focal, equal strength   Data Reviewed: I have independently reviewed following labs and imaging studies   CBC: Recent Labs  Lab 04/23/19 1515 04/24/19 1122 04/26/19 0552  WBC 8.3 8.2 5.8  HGB 10.6* 11.0* 9.6*  HCT 34.1* 35.3* 30.8*  MCV 84.8 84.9 86.0  PLT 255 238 403   Basic Metabolic Panel: Recent Labs  Lab 04/23/19 1515 04/24/19 1122 04/25/19 0443 04/26/19 0552 04/27/19 0519  NA 143 141 140 140 142  K 3.0* 3.2* 3.7 3.5 4.0  CL 107 105 107 106 106  CO2 23 24 25 24 26   GLUCOSE 96 114* 103* 146* 170*  BUN 45* 43* 44* 43* 44*  CREATININE 2.01* 2.22* 2.23* 2.12* 2.09*  CALCIUM 9.6 9.5 8.8* 8.6* 9.0   GFR: Estimated Creatinine Clearance: 44.9 mL/min (A) (by C-G formula based on SCr of 2.09 mg/dL (H)). Liver Function Tests: Recent Labs  Lab 04/23/19 1515  AST 22  ALT 18  ALKPHOS 58  BILITOT 0.5  PROT 7.3  ALBUMIN 3.3*   Coagulation Profile: No results for input(s): INR, PROTIME in the last 168 hours. HbA1C: No results for input(s): HGBA1C in the last 72  hours. CBG: Recent Labs  Lab 04/26/19 1149 04/26/19 1650 04/26/19 2050 04/27/19 0739 04/27/19 1142  GLUCAP 231* 154* 209* 149* 135*    Recent Results (from the past 240 hour(s))  SARS CORONAVIRUS 2 (TAT 6-24 HRS) Nasopharyngeal Nasopharyngeal Swab     Status: None   Collection Time: 04/24/19  4:13 PM   Specimen: Nasopharyngeal Swab  Result Value Ref Range Status   SARS Coronavirus 2 NEGATIVE NEGATIVE Final    Comment: (NOTE) SARS-CoV-2 target nucleic acids are NOT DETECTED. The SARS-CoV-2 RNA is generally detectable in upper and lower respiratory specimens during the acute phase of infection. Negative results do not preclude SARS-CoV-2 infection, do not rule out co-infections with other pathogens, and should not be used as the sole basis for treatment or other patient management decisions. Negative results must be combined with  clinical observations, patient history, and epidemiological information. The expected result is Negative. Fact Sheet for Patients: SugarRoll.be Fact Sheet for Healthcare Providers: https://www.woods-mathews.com/ This test is not yet approved or cleared by the [place] FDA and  has been authorized for detection and/or diagnosis of SARS-CoV-2 by FDA under an Emergency Use Authorization (EUA). This EUA will remain  in effect (meaning this test can be used) for the duration of the COVID-19 declaration under Section 56 4(b)(1) of the Act, 21 U.S.C. section 360bbb-3(b)(1), unless the authorization is terminated or revoked sooner. Performed at [facility], [place] [address] [place], [place] [address]      Radiology Studies: No results found.   Marzetta Board, MD, PhD [facility]  Contact via  [email or web address]

## 2019-04-27 NOTE — Progress Notes (Signed)
*  PRELIMINARY RESULTS* Echocardiogram 2D Echocardiogram has been performed. Definity IV Contrast was used on this study. Larry Callahan Larry Callahan 04/27/2019, 12:39 PM

## 2019-04-28 ENCOUNTER — Other Ambulatory Visit: Payer: Self-pay

## 2019-04-28 DIAGNOSIS — I10 Essential (primary) hypertension: Secondary | ICD-10-CM

## 2019-04-28 DIAGNOSIS — I5033 Acute on chronic diastolic (congestive) heart failure: Secondary | ICD-10-CM

## 2019-04-28 DIAGNOSIS — N183 Chronic kidney disease, stage 3 unspecified: Secondary | ICD-10-CM

## 2019-04-28 DIAGNOSIS — E669 Obesity, unspecified: Secondary | ICD-10-CM

## 2019-04-28 LAB — BASIC METABOLIC PANEL
Anion gap: 10 (ref 5–15)
BUN: 42 mg/dL — ABNORMAL HIGH (ref 8–23)
CO2: 23 mmol/L (ref 22–32)
Calcium: 8.8 mg/dL — ABNORMAL LOW (ref 8.9–10.3)
Chloride: 106 mmol/L (ref 98–111)
Creatinine, Ser: 1.89 mg/dL — ABNORMAL HIGH (ref 0.61–1.24)
GFR calc Af Amer: 42 mL/min — ABNORMAL LOW (ref >60)
GFR calc non Af Amer: 36 mL/min — ABNORMAL LOW (ref >60)
Glucose, Bld: 97 mg/dL (ref 70–99)
Potassium: 3.6 mmol/L (ref 3.5–5.1)
Sodium: 139 mmol/L (ref 135–145)

## 2019-04-28 LAB — CBC
HCT: 32.4 % — ABNORMAL LOW (ref 39.0–52.0)
Hemoglobin: 10.2 g/dL — ABNORMAL LOW (ref 13.0–17.0)
MCH: 26.6 pg (ref 26.0–34.0)
MCHC: 31.5 g/dL (ref 30.0–36.0)
MCV: 84.6 fL (ref 80.0–100.0)
Platelets: 208 10*3/uL (ref 150–400)
RBC: 3.83 MIL/uL — ABNORMAL LOW (ref 4.22–5.81)
RDW: 17.5 % — ABNORMAL HIGH (ref 11.5–15.5)
WBC: 5.7 10*3/uL (ref 4.0–10.5)
nRBC: 0 % (ref 0.0–0.2)

## 2019-04-28 LAB — GLUCOSE, CAPILLARY
Glucose-Capillary: 89 mg/dL (ref 70–99)
Glucose-Capillary: 168 mg/dL — ABNORMAL HIGH (ref 70–99)
Glucose-Capillary: 349 mg/dL — ABNORMAL HIGH (ref 70–99)
Glucose-Capillary: 338 mg/dL — ABNORMAL HIGH (ref 70–99)

## 2019-04-28 MED ORDER — HYDRALAZINE HCL 50 MG PO TABS
100.0000 mg | ORAL_TABLET | Freq: Three times a day (TID) | ORAL | Status: DC
  Administered 2019-04-28 – 2019-04-30 (×6): 100 mg via ORAL
  Filled 2019-04-28 (×6): qty 2

## 2019-04-28 NOTE — Progress Notes (Signed)
Pts O2 sats stayed at 98% during ambulation on RA.

## 2019-04-28 NOTE — Consult Note (Addendum)
Cardiology Consultation:   Patient ID: Cap Massi Carpenter; [id number]; 1956/02/27   Admit date: 04/24/2019 Date of Consult: 04/28/2019  Primary Care Provider: [name], NP Primary Cardiologist: Rockey Situ   Patient Profile:   Larry Callahan is a 63 y.o. male with a hx of nonobstructive CAD by [place] in 2011, HFpEF, pulmonary hypertension, prolonged tobacco abuse smoking from age 73 through 48 quitting in 08/2018, insulin-dependent diabetes, CKD stage III with solitary kidney with a baseline SCr 1.6-2.0, hypertension, iron deficiency anemia with intermittent melena and BRBPR, obesity, OSA with reported compliance with CPAP, possible OHS, and prior noncompliance  who is being seen today for the evaluation of persistent dyspnea/chest pain at the request of Dr. Cruzita Lederer.  History of Present Illness:   Larry Callahan was seen in the ED in 12/2018 with shortness of breath with echo being performed at that time though unable to be reviewed.  Work-up was unrevealing with recommendation for the patient to follow-up with cardiology as an outpatient.  Patient did not follow-up at that time and was seen again in the ED in early 02/2019 again for shortness of breath for the past 3 months that has reportedly been constant.  CTA chest was negative for PE with noted aortic atherosclerosis and coronary artery calcifications.  Chest x-ray showed mild cardiomegaly with vascular congestion.  Troponin was negative.  Patient was given IV Lasix and discharged home.  During each ED visit BP was suboptimally controlled in the 150s over 90s.  He was subsequently admitted to the hospital in mid 02/2019 with acute on chronic diastolic CHF.  Echo showed an EF of 55 to 60%, mild LVH, diastolic dysfunction, normal RV systolic function and ventricular cavity size, severely dilated left atrium, trivial tricuspid regurgitation, mild aortic valve sclerosis without stenosis.  Lexiscan Myoview showed no significant ischemia  and was read as high risk secondary to LVEF read at less than 30% though there was noted significant reduced tracer uptake likely affecting his EF.  Echo during the same admission 1 day later showed preserved LV systolic function as outlined above.  With diuresis symptoms improved.  He was seen in our office for hospital follow-up on 03/12/2019 and reported he was doing well overall.  Weight documented at 121 kg.  He was seen in the ED 2 days later with return of shortness of breath with work-up being relatively unrevealing including normal high-sensitivity troponin, chest x-ray without acute process, nonacute EKG, BNP of 102 which was improved from 3 weeks prior of 322, improved hemoglobin of 10.5 from prior of 9.3.  He was again admitted in mid 03/2019 with continued dyspnea, substernal chest discomfort occurring at rest, intermittent left occipital headache occurring at night with associated night sweats and multiple complaints regarding his "sugar pills."  He was noted to have not been compliant with his Lasix leading up to his admission as he stated it was causing him to go to the bathroom too often.  High sensitivity troponin was again negative.  BNP 184.  Given his headache, he underwent CT head which was not acute.  VQ scan was low risk for PE.  He underwent RHC which showed normal right heart pressures as detailed below.  Symptoms were felt to be multifactorial including COPD, obesity, physical deconditioning and anemia.  It was recommended he be evaluated by pulmonology for COPD work up given his long smoking history.  He was seen in the ED on 10/27 cough and reported hemoptysis. Work up was largely  unrevealing and stable including repeat VQ scan and lower extremity duplex.  He was noted to have some improvement with his inhalers.  He was admitted to the hospital 2 days later on 10/29 with continued SOB, hemoptysis, and intermittent melena. HGB stable.  He received IV iron and underwent EGD/colonoscopy with  noted poor bowel prep and patient refusal for repeat study while admitted. EGD showed possible short segment of Barrett's esophagus.  Again, symptoms were felt to be multifactorial.  He was seen in the ED on 11/10 with BRBPR with negative guaiac on exam and stable HGB of 10.6. Outpatient follow up with GI was advised.   He was admitted to the hospital one day later on 11/11 with continued dyspnea, likely multifactorial and exacerbated by dietary noncompliance.  Labs showed a SCr of 2.2 which has trended to 1.89 currently. HGB 11-->9.6-->10.2.  BNP 88. HS-Tn 21-->14-->13.  CXR showed worsening pulmonary vascular congestion and interstitial edema.  CT renal stone protocol showed no acute abnormality with marked sigmoid diverticulosis without diverticulitis, incidental left adrenal mass that has increased in size, moderate to large fat containing umbilical hernia, aortic atherosclerosis.  Repeat echo 04/27/2019 showed an EF of 45-50%, mild LVH, normal RVSF with normal cavity size, moderately dilated left atrium, mild to moderate aortic valve sclerosis without stenosis.  Echo quality overall was suboptimal. He was diuresed with IV Lasix with a documented UOP of 5.9 L for the admission.  Despite this, he has continued to note chest pain and dyspnea.   This morning, patient reports his dyspnea is improved.  He feels like Duonebs have helped.  He asks if he can take more the "pink pill, cause that helps, my sister said so." He is unable to provide any further details regarding this.  He continues to complain of exertional fatigue. No lower extremity swelling, abdominal distension, PND, or early satiety. He does not follow a low sodium diet at home. Stable 2-pillow orthopnea. Reports compliance with medications and CPAP.  He denies any further BRBPR or melena since his admission. Received IV Lasix 40 mg on 11/14.     Past Medical History:  Diagnosis Date   Allergy    Coronary artery disease    Diabetes  mellitus without complication (HCC)    Diastolic CHF ([place])    GERD (gastroesophageal reflux disease)    Hyperlipidemia    Hypertension    Renal insufficiency    Sleep apnea     Past Surgical History:  Procedure Laterality Date   BACK SURGERY     CHOLECYSTECTOMY     COLONOSCOPY WITH PROPOFOL N/A 04/12/2019   Procedure: COLONOSCOPY WITH PROPOFOL;  Surgeon: [name], MD;  Location: [name] ENDOSCOPY;  Service: Gastroenterology;  Laterality: N/A;   ESOPHAGOGASTRODUODENOSCOPY N/A 04/12/2019   Procedure: ESOPHAGOGASTRODUODENOSCOPY (EGD);  Surgeon: [name], MD;  Location[facility] ENDOSCOPY;  Service: Gastroenterology;  Laterality: N/A;   left arm surgery     nephrectomy Left    PARATHYROIDECTOMY     RIGHT HEART CATH N/A 03/29/2019   Procedure: RIGHT HEART CATH;  Surgeon: [name], MD;  Location: [place] [facility] LAB;  Service: Cardiovascular;  Laterality: N/A;     Home Meds: Prior to Admission medications   Medication Sig Start Date End Date Taking? Authorizing Provider  acetaminophen (TYLENOL) 325 MG tablet Take 2 tablets (650 mg total) by mouth every 4 (four) hours as needed for headache or mild pain. 03/05/19  Yes Gouru, Illene Silver, MD  albuterol (VENTOLIN HFA) 108 (90  Base) MCG/ACT inhaler Inhale 2 puffs into the lungs every 6 (six) hours as needed for wheezing or shortness of breath. 03/05/19  Yes Gouru, Illene Silver, MD  amLODipine (NORVASC) 10 MG tablet Take 1 tablet (10 mg total) by mouth daily. 03/12/19  Yes [name], MD  aspirin EC 81 MG tablet Take 81 mg by mouth daily.   Yes [provider]  Blood Glucose Calibration (TRUE METRIX LEVEL 1) Low SOLN Use as directed 04/04/18  Yes Boscia, Heather E, NP  Blood Glucose Monitoring Suppl (TRUE METRIX AIR GLUCOSE METER) DEVI 1 Device by Does not apply route 2 (two) times daily. 04/13/18  Yes Boscia, Greer Ee, NP  clobetasol cream (TEMOVATE) 5.40 % Apply 1 application topically 2 (two) times  daily. 12/31/18  Yes [name], NP  ferrous sulfate 325 (65 FE) MG EC tablet Take 1 tablet (325 mg total) by mouth 2 (two) times daily with a meal. 04/13/19 05/13/19 Yes Sudini, Srikar, MD  Fluticasone-Salmeterol (ADVAIR DISKUS) 250-50 MCG/DOSE AEPB Inhale 1 puff into the lungs 2 (two) times daily. 03/05/19 03/04/20 Yes Gouru, Illene Silver, MD  furosemide (LASIX) 80 MG tablet Take 1 tablet (80 mg total) by mouth daily. 04/13/19  Yes Hillary Bow, MD  gemfibrozil (LOPID) 600 MG tablet Take 1 tablet (600 mg total) by mouth 2 (two) times daily with a meal. 02/28/19  Yes Boscia, Heather E, NP  glimepiride (AMARYL) 2 MG tablet Take 1 tablet (2 mg total) by mouth daily before breakfast. 03/13/19  Yes Boscia, Heather E, NP  glucose blood (TRUE METRIX BLOOD GLUCOSE TEST) test strip Use as instructed twice daily diag E11.65 11/19/18  Yes Boscia, Heather E, NP  hydrALAZINE (APRESOLINE) 100 MG tablet Take 1 tablet (100 mg total) by mouth 3 (three) times daily. 03/31/19  Yes Gouru, Aruna, MD  Insulin Glargine, 1 Unit Dial, (TOUJEO SOLOSTAR) 300 UNIT/ML SOPN Inject 26 Units into the skin every evening. 03/13/19 06/11/19 Yes Boscia, Heather E, NP  ipratropium-albuterol (DUONEB) 0.5-2.5 (3) MG/3ML SOLN Take 3 mLs by nebulization every 6 (six) hours as needed. 03/08/19  Yes Scarboro, Audie Clear, NP  isosorbide mononitrate (IMDUR) 30 MG 24 hr tablet Take 1 tablet (30 mg total) by mouth 2 (two) times daily. 04/13/19  Yes Sudini, Alveta Heimlich, MD  loratadine (CLARITIN) 10 MG tablet Take 1 tablet (10 mg total) by mouth daily. 04/01/19  Yes Gouru, Illene Silver, MD  metoprolol succinate (TOPROL-XL) 50 MG 24 hr tablet Take 1 tablet (50 mg total) by mouth at bedtime. Take with or immediately following a meal. 03/12/19  Yes Gollan, Kathlene November, MD  nicotine (NICODERM CQ - DOSED IN MG/24 HOURS) 21 mg/24hr patch Place 1 patch (21 mg total) onto the skin daily. 12/31/18  Yes [name], NP  NOVOFINE 32G X 6 MM MISC To use with [place] injections daily  12/13/18  Yes Boscia, Heather E, NP  omeprazole (PRILOSEC) 40 MG capsule Take 1 capsule (40 mg total) by mouth daily. 03/01/19  Yes Scarboro, Audie Clear, NP  tiotropium (SPIRIVA HANDIHALER) 18 MCG inhalation capsule Place 1 capsule (18 mcg total) into inhaler and inhale daily. 04/13/19 05/13/19 Yes Sudini, Alveta Heimlich, MD  triamcinolone ointment (KENALOG) 0.1 % Apply 1 application topically 2 (two) times daily. 11/23/18  Yes [name], NP  TRUEPLUS LANCETS 33G MISC Use as directed twice daily diag E11.65 04/04/18  Yes Boscia, Greer Ee, NP  ferrous gluconate (FERGON) 324 MG tablet Take 1 tablet (324 mg total) by mouth daily with breakfast. Patient  not taking: Reported on 04/24/2019 12/31/18   [name], NP  predniSONE (DELTASONE) 20 MG tablet Take 1 tablet (20 mg total) by mouth daily. Patient not taking: Reported on 04/24/2019 04/13/19   Hillary Bow, MD    Inpatient Medications: Scheduled Meds:  amLODipine  10 mg Oral Daily   aspirin EC  81 mg Oral Daily   ferrous sulfate  325 mg Oral BID WC   gemfibrozil  600 mg Oral BID WC   heparin  5,000 Units Subcutaneous Q8H   hydrALAZINE  50 mg Oral TID   insulin aspart  0-5 Units Subcutaneous QHS   insulin aspart  0-9 Units Subcutaneous TID WC   insulin glargine  20 Units Subcutaneous QHS   ipratropium-albuterol  3 mL Nebulization BID   isosorbide mononitrate  30 mg Oral BID   metoprolol succinate  50 mg Oral QHS   nicotine  21 mg Transdermal Daily   pantoprazole  40 mg Oral Daily   predniSONE  40 mg Oral Q breakfast   sodium chloride flush  3 mL Intravenous Q12H   tiotropium  18 mcg Inhalation Daily   Continuous Infusions:  sodium chloride     PRN Meds: sodium chloride, acetaminophen, albuterol, hydrOXYzine, ondansetron (ZOFRAN) IV, sodium chloride flush  Allergies:   Allergies  Allergen Reactions   Penicillins Rash    Social History:   Social History   Socioeconomic History   Marital status: Single     Spouse name: Not on file   Number of children: Not on file   Years of education: Not on file   Highest education level: Not on file  Occupational History   Not on file  Social Needs   Financial resource strain: Not on file   Food insecurity    Worry: Not on file    Inability: Not on file   Transportation needs    Medical: Not on file    Non-medical: Not on file  Tobacco Use   Smoking status: Former Smoker   Smokeless tobacco: Former Systems developer    Types: Chew  Substance and Sexual Activity   Alcohol use: No    Frequency: Never   Drug use: No   Sexual activity: Not on file  Lifestyle   Physical activity    Days per week: Not on file    Minutes per session: Not on file   Stress: Not on file  Relationships   Social connections    Talks on phone: Not on file    Gets together: Not on file    Attends religious service: Not on file    Active member of club or organization: Not on file    Attends meetings of clubs or organizations: Not on file    Relationship status: Not on file   Intimate partner violence    Fear of current or ex partner: Not on file    Emotionally abused: Not on file    Physically abused: Not on file    Forced sexual activity: Not on file  Other Topics Concern   Not on file  Social History Narrative   Not on file     Family History:   Family History  Problem Relation Age of Onset   Diabetes Mother    Lung cancer Father    Diabetes Sister    Hypertension Sister    Heart disease Sister    Cancer Sister    Bone cancer Brother     ROS:  Review of Systems  Constitutional: Positive for malaise/fatigue. Negative for chills, diaphoresis, fever and weight loss.  HENT: Negative for congestion.   Eyes: Negative for discharge and redness.  Respiratory: Positive for cough, hemoptysis, sputum production, shortness of breath and wheezing.        Cough productive of yellow sputum Reports frank hemoptysis last month, none since    Cardiovascular: Positive for chest pain and orthopnea. Negative for palpitations, claudication, leg swelling and PND.       No current chest pain Stable 2-pillow orthopnea  Gastrointestinal: Positive for blood in stool and melena. Negative for abdominal pain, heartburn, nausea and vomiting.       Intermittent BRBPR and melan, none since he was admitted this time  Genitourinary: Negative for hematuria.  Musculoskeletal: Negative for falls and myalgias.  Skin: Negative for rash.  Neurological: Positive for weakness. Negative for dizziness, tingling, tremors, sensory change, speech change, focal weakness and loss of consciousness.  Endo/Heme/Allergies: Bruises/bleeds easily.  Psychiatric/Behavioral: Negative for substance abuse. The patient is nervous/anxious.   All other systems reviewed and are negative.     Physical Exam/Data:   Vitals:   04/27/19 2153 04/28/19 0407 04/28/19 0813 04/28/19 0907  BP: (!) 149/72 122/74 (!) 152/68   Pulse: 82 (!) 57 (!) 57 (!) 55  Resp: 19 18  18   Temp: 98.2 F (36.8 C) 97.9 F (36.6 C) (!) 97.4 F (36.3 C)   TempSrc: Oral  Oral   SpO2: 96% 93% 100% 97%  Weight:      Height:        Intake/Output Summary (Last 24 hours) at 04/28/2019 1009 Last data filed at 04/28/2019 0909 Gross per 24 hour  Intake 3 ml  Output 2250 ml  Net -2247 ml   Filed Weights   04/25/19 0316 04/26/19 0509 04/27/19 0513  Weight: 117.5 kg 118.3 kg 119 kg   Body mass index is 38.75 kg/m.   Physical Exam: General: Well developed, well nourished, in no acute distress. Head: Normocephalic, atraumatic, sclera non-icteric, no xanthomas, nares without discharge.  Neck: Negative for carotid bruits. JVD not elevated. Lungs: Faint bilateral expiratory wheezing. Breathing is unlabored. Heart: RRR with S1 S2. No murmurs, rubs, or gallops appreciated. Abdomen: Soft, non-tender, non-distended with normoactive bowel sounds. No hepatomegaly. No rebound/guarding. No obvious  abdominal masses. Msk:  Strength and tone appear normal for age. Extremities: No clubbing or cyanosis. No edema. Distal pedal pulses are 2+ and equal bilaterally. Neuro: Alert and oriented X 3. No facial asymmetry. No focal deficit. Moves all extremities spontaneously. Psych:  Responds to questions appropriately with a normal affect.   EKG:  The EKG was personally reviewed and demonstrates: sinus bradycardia, 58 bpm, nonspecific TWI in aVL Telemetry:  Telemetry was personally reviewed and demonstrates: sinus bradycardia, upper 40s to 50s bpm, rare PAC  Weights: Filed Weights   04/25/19 0316 04/26/19 0509 04/27/19 0513  Weight: 117.5 kg 118.3 kg 119 kg    Relevant CV Studies:  2D echo 04/27/2019: 1. Left ventricular ejection fraction, by visual estimation, is 45 to 50%. The left ventricle has mildly decreased function. There is mildly increased left ventricular hypertrophy.  2. Left ventricular diastolic parameters are indeterminate.  3. Global right ventricle has normal systolic function.The right ventricular size is normal. No increase in right ventricular wall thickness.  4. Left atrial size was moderately dilated.  5. Right atrial size was normal.  6. The mitral valve is normal in structure. No evidence of mitral valve regurgitation. No evidence of mitral stenosis.  7. The tricuspid valve is normal in structure. Tricuspid valve regurgitation is not demonstrated.  8. The aortic valve is normal in structure. Aortic valve regurgitation is not visualized. Mild to moderate aortic valve sclerosis/calcification without any evidence of aortic stenosis.  9. The pulmonic valve was normal in structure. Pulmonic valve regurgitation is not visualized. 10. TR signal is inadequate for assessing pulmonary artery systolic pressure. 11. The inferior vena cava is normal in size with greater than 50% respiratory variability, suggesting right atrial pressure of 3 mmHg. __________  RHC  03/29/2019: Pulmonary capillary wedge pressure mean of 8 PA: 32/16 mean 23 RA mean of 10 RV 26/12/13   Cardiac output 8.17 Cardiac index 3.42  Final Conclusions:   Normal right heart pressures  Recommendations:  Etiology of his shortness of breath likely multifactorial He is at euvolemic state at this time, no aggressive diuresis needed Needs referral to pulmonary for COPD work-up, long smoking history He may benefit from lung Works, exercise as needed sedentary lifestyle and is deconditioned Recommended weight loss, lifestyle modification given obesity contributing to his shortness of breath symptoms __________  2D echo 03/03/2019: 1. Left ventricular ejection fraction, by visual estimation, is 55 to 60%. The left ventricle has normal function. Normal left ventricular size. Left ventricular septal wall thickness was mildly increased. Mildly increased left ventricular posterior  wall thickness. There is mildly increased left ventricular hypertrophy.  2. Elevated left ventricular end-diastolic pressure.  3. Left ventricular diastolic Doppler parameters are consistent with pseudonormalization pattern of LV diastolic filling.  4. Global right ventricle has normal systolic function.The right ventricular size is normal. No increase in right ventricular wall thickness.  5. Left atrial size was severely dilated.  6. Right atrial size was normal.  7. The mitral valve is normal in structure. Trace mitral valve regurgitation. No evidence of mitral stenosis.  8. The tricuspid valve is normal in structure. Tricuspid valve regurgitation is trivial.  9. The aortic valve is normal in structure. Aortic valve regurgitation was not visualized by color flow Doppler. Mild aortic valve sclerosis without stenosis. 10. The pulmonic valve was normal in structure. Pulmonic valve regurgitation is trivial by color flow Doppler. 11. Moderately elevated pulmonary artery systolic pressure. 12. The inferior vena  cava is dilated in size with >50% respiratory variability, suggesting right atrial pressure of 8 mmHg. ___________  Nuclear stress test 03/02/2019:  This is a high risk study due to reduced systolic function. There is no ischemia.  There was no ST segment deviation noted during stress.  The left ventricular ejection fraction is severely decreased (<30%). However the veracity of these findings is unclear given poor tracer uptake.  Resting images are poor due to reduced tracer uptake.  Recommend echo for systolic function assessment.   Laboratory Data:  Chemistry Recent Labs  Lab 04/26/19 0552 04/27/19 0519 04/28/19 0514  NA 140 142 139  K 3.5 4.0 3.6  CL 106 106 106  CO2 24 26 23   GLUCOSE 146* 170* 97  BUN 43* 44* 42*  CREATININE 2.12* 2.09* 1.89*  CALCIUM 8.6* 9.0 8.8*  GFRNONAA 32* 32* 36*  GFRAA 37* 37* 42*  ANIONGAP 10 10 10     Recent Labs  Lab 04/23/19 1515  PROT 7.3  ALBUMIN 3.3*  AST 22  ALT 18  ALKPHOS 58  BILITOT 0.5   Hematology Recent Labs  Lab 04/24/19 1122 04/26/19 0552 04/28/19 0514  WBC 8.2 5.8 5.7  RBC 4.16* 3.58* 3.83*  HGB 11.0* 9.6* 10.2*  HCT 35.3*  30.8* 32.4*  MCV 84.9 86.0 84.6  MCH 26.4 26.8 26.6  MCHC 31.2 31.2 31.5  RDW 17.9* 17.4* 17.5*  PLT 238 190 208   Cardiac EnzymesNo results for input(s): TROPONINI in the last 168 hours. No results for input(s): TROPIPOC in the last 168 hours.  BNP Recent Labs  Lab 04/24/19 1122  BNP 88.0    DDimer No results for input(s): DDIMER in the last 168 hours.  Radiology/Studies:  Dg Chest 2 View  Result Date: 04/24/2019 IMPRESSION: Worsening central pulmonary vascular congestion and interstitial edema. Electronically Signed   By: Lucrezia Europe M.D.   On: 04/24/2019 12:25   Ct Renal Stone Study  Result Date: 04/24/2019 IMPRESSION: 1. No acute abnormality. No evidence of bowel obstruction or acute bowel inflammation. Marked sigmoid diverticulosis, with no evidence of acute  diverticulitis. 2. No right hydronephrosis.  No urolithiasis. 3. 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 Given the large size and continued growth, surgical consultation may be considered. 4. Left nephrectomy. 5. Mild prostatomegaly. 6. Moderate to large fat containing umbilical hernia, slightly increased. 7.  Aortic Atherosclerosis (ICD10-I70.0). Electronically Signed   By: Ilona Sorrel M.D.   On: 04/24/2019 16:17    Assessment and Plan:   1. Acute on chronic dyspnea: -Overall, this is a difficult situation given his comorbid conditions and lack of compliance at home -Symptoms are multifactorial including HFpEF, COPD, anemia, obesity, and physical deconditioning  -Recommend assessing ambulatory oxygen to assess if this is needed  2. Possible acute HFrEF with acute on chronic diastolic CHF: -Echo this admission with a newly reduced LVSF as outlined above, though the study was overall suboptimal -Again, this is a difficult situation given his CKD with a solitary kidney, anemia with intermittent GI and compliance issues -Ideally, would consider LHC for definitive evaluation, however, that is less than ideal given his comorbid conditions including CKD with solitary kidney, anemia, and intermittent GI bleed -That said, his Myoview in 02/2019 showed no evidence of ischemia and was read as high risk only in the setting of reduced EF, which was false in the setting of poor tracer uptake -It would be difficult to potentially commit him to DAPT, possibly for even a month (still needs repeat colonoscopy) -[place]ston [place] discuss with rounding MD as well as recommended further input from interventional cardiology on Monday, 11/16 -ASA -Metoprolol  -Given suboptimal study, Yuval Rubens not yet discontinue amlodipine  -BMI too high for ReDs vest -Place on PO Lasix 40 mg daily (On 80 mg daily prior to this admission, though was on 20 mg daily in  03/2019)  3. AECOPD: -Improving with nebs -Steroids -Needs outpatient pulmonology follow up  4. Iron deficiency anemia with intermittent GI bleeding: -HGB stable -Needs repeat colonoscopy as an outpatient  -Previously required iron infusion  5. CKD stage III with solitary kidney: -SCr improved today -PTA Lasix 80 mg daily more recently, though at consult in mid 03/2019, he was only on 20 mg daily -Chevonne Bostrom place him on Lasix 40 mg daily -Close monitoring of renal function  6. Obesity: -Weight loss advised   7. HTN: -Increase hydralazine to 100 mg tid -Continue Metoprolol, amlodipine, Imdur, and Lasix   For questions or updates, please contact [place] Please consult [email or web address] for contact info under Cardiology/STEMI.   Signed, Christell Faith, PA-C [facility] Pager: [phone] 04/28/2019, 10:09 AM   I have seen and examined this patient with Thurmond Butts  Dunn.  Agree with above, note added to reflect my findings.  On exam, RRR, no murmurs, lungs clear.  Has continued shortness of breath, though this has improved since admission.  Is net out 5.9 L.  He appears euvolemic.  Would resume p.o. Lasix.  He would likely benefit from 40 mg daily.  I talked to him about daily weights and adjusting Lasix based on that.  He is not complaining of chest pain currently, and has had a Myoview recently without evidence of ischemia.  I do think that his dyspnea is likely multifactorial.  Jessel Gettinger M. Destanee Bedonie MD 04/28/2019 12:34 PM

## 2019-04-28 NOTE — Progress Notes (Addendum)
PROGRESS NOTE  Larry Callahan JEH:[id number] DOB: 05-22-1956 DOA: 04/24/2019 PCP: [name], NP   LOS: 3 days   Brief Narrative / Interim history: 63 year old male with chronic kidney disease stage IIIb with baseline creatinine around 2.0, type 2 diabetes mellitus, OSA on CPAP, COPD, chronic diastolic CHF, longstanding tobacco use and quit 3 months ago who was admitted on 04/24/2019 with acute on chronic diastolic heart failure and significant dyspnea with minimal activities  Subjective / 24h Interval events: Has ongoing shortness of breath  Assessment & Plan: Principal Problem Acute on chronic dyspnea, likely multifactorial due to acute on chronic diastolic CHF, also a degree of pulmonary edema on admission, underlying COPD, OSA, obesity and longstanding tobacco use -Has had dyspnea for few months but worsened in the last few days.  He is quite frustrated that he cannot feel like he was in the past.  Long discussion with the patient on admission that his dyspnea is multifactorial, and he is getting treatment for each disease process that contributes however may have a degree of dyspnea at baseline that may never be reversed -He was evaluated by cardiology with a right heart cath just a month ago that did not show any significant volume overload.  He was volume overloaded during this admission given lower extremity edema, crackles and pulmonary edema on the chest x-ray, likely in the setting of dietary nonadherence -He is being followed by pulmonology as an outpatient as well -Continue Lasix, he is diuresing well and renal function has remained stable -Despite adequate diuresis he remains quite dyspneic, 2D echo was obtained today which showed an EF of 45-50%, compared with the EF of 55 to 60% in September 2020.  Discussed with the reading cardiologist Dr. Fletcher Anon and today's echo was somewhat suboptimal. -Cardiology has been consulted and evaluated patient, transition to p.o.  Lasix, if renal function stable he will go home tomorrow  Active Problems Chronic kidney disease stage IIIb -His creatinine has progressively climbed over the last several months, now in the 2 range. -Creatinine has remained stable with Lasix, continue to monitor BMP  Type 2 diabetes mellitus -Continue sliding scale and long-acting insulin  CBG (last 3)  Recent Labs    04/27/19 2148 04/28/19 0817 04/28/19 1147  GLUCAP 229* 89 168*    Iron deficiency anemia, history of GI bleed -Hemoglobin overall stable  Hypertension -Continue medications as below  COPD -No wheezing  Coronary artery disease -Cardiac enzymes negative.  Cardiology to see   Scheduled Meds: . amLODipine  10 mg Oral Daily  . aspirin EC  81 mg Oral Daily  . ferrous sulfate  325 mg Oral BID WC  . gemfibrozil  600 mg Oral BID WC  . heparin  5,000 Units Subcutaneous Q8H  . hydrALAZINE  100 mg Oral TID  . insulin aspart  0-5 Units Subcutaneous QHS  . insulin aspart  0-9 Units Subcutaneous TID WC  . insulin glargine  20 Units Subcutaneous QHS  . ipratropium-albuterol  3 mL Nebulization BID  . isosorbide mononitrate  30 mg Oral BID  . metoprolol succinate  50 mg Oral QHS  . nicotine  21 mg Transdermal Daily  . pantoprazole  40 mg Oral Daily  . predniSONE  40 mg Oral Q breakfast  . sodium chloride flush  3 mL Intravenous Q12H  . tiotropium  18 mcg Inhalation Daily   Continuous Infusions: . sodium chloride     PRN Meds:.sodium chloride, acetaminophen, albuterol, hydrOXYzine, ondansetron (ZOFRAN) IV, sodium chloride flush  DVT prophylaxis: heparin Code Status: Full code Family Communication: d/w patient Disposition Plan: home when ready   Consultants:  None   Procedures:  None   Microbiology  None   Antimicrobials: None     Objective: Vitals:   04/28/19 0407 04/28/19 0813 04/28/19 0907 04/28/19 1336  BP: 122/74 (!) 152/68    Pulse: (!) 57 (!) 57 (!) 55   Resp: 18  18   Temp: 97.9 F  (36.6 C) (!) 97.4 F (36.3 C)    TempSrc:  Oral    SpO2: 93% 100% 97% 98%  Weight:      Height:        Intake/Output Summary (Last 24 hours) at 04/28/2019 1413 Last data filed at 04/28/2019 0909 Gross per 24 hour  Intake 3 ml  Output 1275 ml  Net -1272 ml   Filed Weights   04/25/19 0316 04/26/19 0509 04/27/19 0513  Weight: 117.5 kg 118.3 kg 119 kg    Examination:  Constitutional: NAD Respiratory: CTA, no wheezing Cardiovascular: rrr   Data Reviewed: I have independently reviewed following labs and imaging studies   CBC: Recent Labs  Lab 04/23/19 1515 04/24/19 1122 04/26/19 0552 04/28/19 0514  WBC 8.3 8.2 5.8 5.7  HGB 10.6* 11.0* 9.6* 10.2*  HCT 34.1* 35.3* 30.8* 32.4*  MCV 84.8 84.9 86.0 84.6  PLT 255 238 190 742   Basic Metabolic Panel: Recent Labs  Lab 04/24/19 1122 04/25/19 0443 04/26/19 0552 04/27/19 0519 04/28/19 0514  NA 141 140 140 142 139  K 3.2* 3.7 3.5 4.0 3.6  CL 105 107 106 106 106  CO2 24 25 24 26 23   GLUCOSE 114* 103* 146* 170* 97  BUN 43* 44* 43* 44* 42*  CREATININE 2.22* 2.23* 2.12* 2.09* 1.89*  CALCIUM 9.5 8.8* 8.6* 9.0 8.8*   GFR: Estimated Creatinine Clearance: 49.6 mL/min (A) (by C-G formula based on SCr of 1.89 mg/dL (H)). Liver Function Tests: Recent Labs  Lab 04/23/19 1515  AST 22  ALT 18  ALKPHOS 58  BILITOT 0.5  PROT 7.3  ALBUMIN 3.3*   Coagulation Profile: No results for input(s): INR, PROTIME in the last 168 hours. HbA1C: No results for input(s): HGBA1C in the last 72 hours. CBG: Recent Labs  Lab 04/27/19 1142 04/27/19 1648 04/27/19 2148 04/28/19 0817 04/28/19 1147  GLUCAP 135* 178* 229* 89 168*    Recent Results (from the past 240 hour(s))  SARS CORONAVIRUS 2 (TAT 6-24 HRS) Nasopharyngeal Nasopharyngeal Swab     Status: None   Collection Time: 04/24/19  4:13 PM   Specimen: Nasopharyngeal Swab  Result Value Ref Range Status   SARS Coronavirus 2 NEGATIVE NEGATIVE Final    Comment: (NOTE)  SARS-CoV-2 target nucleic acids are NOT DETECTED. The SARS-CoV-2 RNA is generally detectable in upper and lower respiratory specimens during the acute phase of infection. Negative results do not preclude SARS-CoV-2 infection, do not rule out co-infections with other pathogens, and should not be used as the sole basis for treatment or other patient management decisions. Negative results must be combined with clinical observations, patient history, and epidemiological information. The expected result is Negative. Fact Sheet for Patients: SugarRoll.be Fact Sheet for Healthcare Providers: https://www.woods-mathews.com/ This test is not yet approved or cleared by the [place] FDA and  has been authorized for detection and/or diagnosis of SARS-CoV-2 by FDA under an Emergency Use Authorization (EUA). This EUA will remain  in effect (meaning this test can be used) for the duration of the COVID-19 declaration  under Section 56 4(b)(1) of the Act, 21 U.S.C. section 360bbb-3(b)(1), unless the authorization is terminated or revoked sooner. Performed at [facility], [place] [address], [place], [place] [address]      Radiology Studies: No results found.   Marzetta Board, MD, PhD [facility]  Contact via  [email or web address]

## 2019-04-29 ENCOUNTER — Other Ambulatory Visit: Payer: Self-pay

## 2019-04-29 ENCOUNTER — Inpatient Hospital Stay: Payer: Medicare PPO

## 2019-04-29 LAB — BASIC METABOLIC PANEL
Anion gap: 10 (ref 5–15)
BUN: 41 mg/dL — ABNORMAL HIGH (ref 8–23)
CO2: 24 mmol/L (ref 22–32)
Calcium: 9.1 mg/dL (ref 8.9–10.3)
Chloride: 108 mmol/L (ref 98–111)
Creatinine, Ser: 1.86 mg/dL — ABNORMAL HIGH (ref 0.61–1.24)
GFR calc Af Amer: 43 mL/min — ABNORMAL LOW (ref >60)
GFR calc non Af Amer: 37 mL/min — ABNORMAL LOW (ref >60)
Glucose, Bld: 152 mg/dL — ABNORMAL HIGH (ref 70–99)
Potassium: 3.6 mmol/L (ref 3.5–5.1)
Sodium: 142 mmol/L (ref 135–145)

## 2019-04-29 LAB — GLUCOSE, CAPILLARY
Glucose-Capillary: 165 mg/dL — ABNORMAL HIGH (ref 70–99)
Glucose-Capillary: 208 mg/dL — ABNORMAL HIGH (ref 70–99)
Glucose-Capillary: 179 mg/dL — ABNORMAL HIGH (ref 70–99)
Glucose-Capillary: 247 mg/dL — ABNORMAL HIGH (ref 70–99)
Glucose-Capillary: 244 mg/dL — ABNORMAL HIGH (ref 70–99)

## 2019-04-29 MED ORDER — PREDNISONE 20 MG PO TABS: 20.0000 mg | ORAL_TABLET | Freq: Every day | ORAL | 0 refills | Status: DC

## 2019-04-29 NOTE — Plan of Care (Signed)
  Problem: Health Behavior/Discharge Planning: Goal: Ability to manage health-related needs will improve Outcome: Progressing   Problem: Clinical Measurements: Goal: Ability to maintain clinical measurements within normal limits will improve Outcome: Progressing Goal: Will remain free from infection Outcome: Progressing Goal: Respiratory complications will improve Outcome: Progressing Goal: Cardiovascular complication will be avoided Outcome: Progressing   Problem: Cardiac: Goal: Ability to achieve and maintain adequate cardiopulmonary perfusion will improve Outcome: Progressing   Problem: Activity: Goal: Risk for activity intolerance will decrease Outcome: Not Progressing Note: Patient was drowsy this shift. Patient did not ambulate around the nurse's desk.   Problem: Pain Managment: Goal: General experience of comfort will improve Outcome: Completed/Met   Problem: Safety: Goal: Ability to remain free from injury will improve Outcome: Completed/Met

## 2019-04-29 NOTE — Progress Notes (Signed)
PROGRESS NOTE  Larry Callahan NFA:[id number] DOB: [birth year] DOA: 04/24/2019 PCP: [name], NP   LOS: 4 days   Brief Narrative / Interim history: 63 year old male with chronic kidney disease stage IIIb with baseline creatinine around 2.0, type 2 diabetes mellitus, OSA on CPAP, COPD, chronic diastolic CHF, longstanding tobacco use and quit 3 months ago who was admitted on 04/24/2019 with acute on chronic diastolic heart failure and significant dyspnea with minimal activities  Subjective / 24h Interval events: Has ongoing shortness of breath  Assessment & Plan: Principal Problem Acute on chronic dyspnea, likely multifactorial due to acute on chronic diastolic CHF, also a degree of pulmonary edema on admission, underlying COPD, OSA, obesity and longstanding tobacco use -Has had dyspnea for few months but worsened in the last few days.  He is quite frustrated that he cannot feel like he was in the past.  Long discussion with the patient on admission that his dyspnea is multifactorial, and he is getting treatment for each disease process that contributes however may have a degree of dyspnea at baseline that may never be reversed -He was evaluated by cardiology with a right heart cath just a month ago that did not show any significant volume overload.  He was volume overloaded during this admission given lower extremity edema, crackles and pulmonary edema on the chest x-ray, likely in the setting of dietary nonadherence -He is being followed by pulmonology as an outpatient as well -Continue Lasix, he is diuresing well and renal function has remained stable -Despite adequate diuresis he remains quite dyspneic, 2D echo was obtained today which showed an EF of 45-50%, compared with the EF of 55 to 60% in September 2020.  Discussed with the reading cardiologist Dr. Fletcher Anon and today's echo was somewhat suboptimal. -Cardiology has been consulted and evaluated patient, transition to p.o.  Lasix, if renal function stable he will go home tomorrow  Active Problems Chronic kidney disease stage IIIb -His creatinine has progressively climbed over the last several months, now in the 2 range. -Creatinine has remained stable with Lasix, continue to monitor BMP  Type 2 diabetes mellitus -Continue sliding scale and long-acting insulin  CBG (last 3)  Recent Labs    04/29/19 1050 04/29/19 1205 04/29/19 1703  GLUCAP 208* 179* 247*    Iron deficiency anemia, history of GI bleed -Hemoglobin overall stable  Hypertension -Continue medications as below  COPD -No wheezing  Coronary artery disease -Cardiac enzymes negative.  Cardiology to see  Dizziness -reported today, CT negative. Patient reports a fall, no witnesses, he was in bed when he reported the fall, hip XR negative  He is stable for home discharge today 11/16 but he does not have a ride nor a way to get into his house and will stay overnight  Scheduled Meds: . amLODipine  10 mg Oral Daily  . aspirin EC  81 mg Oral Daily  . ferrous sulfate  325 mg Oral BID WC  . gemfibrozil  600 mg Oral BID WC  . heparin  5,000 Units Subcutaneous Q8H  . hydrALAZINE  100 mg Oral TID  . insulin aspart  0-5 Units Subcutaneous QHS  . insulin aspart  0-9 Units Subcutaneous TID WC  . insulin glargine  20 Units Subcutaneous QHS  . isosorbide mononitrate  30 mg Oral BID  . metoprolol succinate  50 mg Oral QHS  . nicotine  21 mg Transdermal Daily  . pantoprazole  40 mg Oral Daily  . predniSONE  40 mg Oral  Q breakfast  . sodium chloride flush  3 mL Intravenous Q12H  . tiotropium  18 mcg Inhalation Daily   Continuous Infusions: . sodium chloride     PRN Meds:.sodium chloride, acetaminophen, albuterol, hydrOXYzine, ondansetron (ZOFRAN) IV, sodium chloride flush  DVT prophylaxis: heparin Code Status: Full code Family Communication: d/w patient Disposition Plan: home when ready   Consultants:  None   Procedures:  None    Microbiology  None   Antimicrobials: None     Objective: Vitals:   04/29/19 1104 04/29/19 1107 04/29/19 1339 04/29/19 1704  BP: 133/78 (!) 156/88 136/69 (!) 141/79  Pulse: 67 67 65 66  Resp:   (!) 22 19  Temp:   (!) 97.4 F (36.3 C) 98.1 F (36.7 C)  TempSrc:   Oral   SpO2:   99% 96%  Weight:      Height:        Intake/Output Summary (Last 24 hours) at 04/29/2019 1806 Last data filed at 04/29/2019 1752 Gross per 24 hour  Intake 723 ml  Output 2960 ml  Net -2237 ml   Filed Weights   04/26/19 0509 04/27/19 0513 04/29/19 0520  Weight: 118.3 kg 119 kg 119.6 kg    Examination:  Constitutional: NAD Respiratory: CTA Cardiovascular: RRR   Data Reviewed: I have independently reviewed following labs and imaging studies   CBC: Recent Labs  Lab 04/23/19 1515 04/24/19 1122 04/26/19 0552 04/28/19 0514  WBC 8.3 8.2 5.8 5.7  HGB 10.6* 11.0* 9.6* 10.2*  HCT 34.1* 35.3* 30.8* 32.4*  MCV 84.8 84.9 86.0 84.6  PLT 255 238 190 272   Basic Metabolic Panel: Recent Labs  Lab 04/25/19 0443 04/26/19 0552 04/27/19 0519 04/28/19 0514 04/29/19 0513  NA 140 140 142 139 142  K 3.7 3.5 4.0 3.6 3.6  CL 107 106 106 106 108  CO2 25 24 26 23 24   GLUCOSE 103* 146* 170* 97 152*  BUN 44* 43* 44* 42* 41*  CREATININE 2.23* 2.12* 2.09* 1.89* 1.86*  CALCIUM 8.8* 8.6* 9.0 8.8* 9.1   GFR: Estimated Creatinine Clearance: 50.6 mL/min (A) (by C-G formula based on SCr of 1.86 mg/dL (H)). Liver Function Tests: Recent Labs  Lab 04/23/19 1515  AST 22  ALT 18  ALKPHOS 58  BILITOT 0.5  PROT 7.3  ALBUMIN 3.3*   Coagulation Profile: No results for input(s): INR, PROTIME in the last 168 hours. HbA1C: No results for input(s): HGBA1C in the last 72 hours. CBG: Recent Labs  Lab 04/28/19 2126 04/29/19 0739 04/29/19 1050 04/29/19 1205 04/29/19 1703  GLUCAP 338* 165* 208* 179* 247*    Recent Results (from the past 240 hour(s))  SARS CORONAVIRUS 2 (TAT 6-24 HRS) Nasopharyngeal  Nasopharyngeal Swab     Status: None   Collection Time: 04/24/19  4:13 PM   Specimen: Nasopharyngeal Swab  Result Value Ref Range Status   SARS Coronavirus 2 NEGATIVE NEGATIVE Final    Comment: (NOTE) SARS-CoV-2 target nucleic acids are NOT DETECTED. The SARS-CoV-2 RNA is generally detectable in upper and lower respiratory specimens during the acute phase of infection. Negative results do not preclude SARS-CoV-2 infection, do not rule out co-infections with other pathogens, and should not be used as the sole basis for treatment or other patient management decisions. Negative results must be combined with clinical observations, patient history, and epidemiological information. The expected result is Negative. Fact Sheet for Patients: SugarRoll.be Fact Sheet for Healthcare Providers: https://www.woods-mathews.com/ This test is not yet approved or cleared by the [place]  States FDA and  has been authorized for detection and/or diagnosis of SARS-CoV-2 by FDA under an Emergency Use Authorization (EUA). This EUA will remain  in effect (meaning this test can be used) for the duration of the COVID-19 declaration under Section 56 4(b)(1) of the Act, 21 U.S.C. section 360bbb-3(b)(1), unless the authorization is terminated or revoked sooner. Performed at [facility], [place] [address], [place], [place] [address]      Radiology Studies: Ct Head Wo Contrast  Result Date: 04/29/2019 CLINICAL DATA:  Syncopal episode today with trauma to the right side of the head EXAM: CT HEAD WITHOUT CONTRAST TECHNIQUE: Contiguous axial images were obtained from the base of the skull through the vertex without intravenous contrast. COMPARISON:  03/26/2019 FINDINGS: Brain: The brain shows a normal appearance without evidence of malformation, atrophy, old or acute small or large vessel infarction, mass lesion, hemorrhage, hydrocephalus or extra-axial collection. Vascular:  No hyperdense vessel. No evidence of atherosclerotic calcification. Skull: Normal.  No traumatic finding.  No focal bone lesion. Sinuses/Orbits: Sinuses are clear. Orbits appear normal. Mastoids are clear. Other: None significant IMPRESSION: Normal head CT Electronically Signed   By: Nelson Chimes M.D.   On: 04/29/2019 16:59   Dg Hip Unilat With Pelvis 2-3 Views Left  Result Date: 04/29/2019 CLINICAL DATA:  Fall EXAM: DG HIP (WITH OR WITHOUT PELVIS) 2-3V LEFT COMPARISON:  None. FINDINGS: Anatomic alignment is maintained. There is no acute fracture. Mild changes of osteoarthritis. IMPRESSION: No acute fracture. Electronically Signed   By: Macy Mis M.D.   On: 04/29/2019 12:06   Dg Hip Unilat With Pelvis 2-3 Views Right  Result Date: 04/29/2019 CLINICAL DATA:  Fall EXAM: DG HIP (WITH OR WITHOUT PELVIS) 2-3V RIGHT COMPARISON:  2011 FINDINGS: Alignment is anatomic. There is no acute fracture. Changes of osteoarthritis are present similar to 2011. IMPRESSION: No acute fracture at the right hip. Electronically Signed   By: Macy Mis M.D.   On: 04/29/2019 12:03     Marzetta Board, MD, PhD [facility]  Contact via  [email or web address]

## 2019-04-29 NOTE — Progress Notes (Signed)
Cardiovascular and Pulmonary Nurse Navigator Note:    Rounded on patient to speak with patient again about the [facility].  Per Darylene Price, FNP, patient has had a few appointments scheduled and has cancelled them stating he only wants to be followed by his cardiologist.  Discussed with patient again that being followed in the [facility] does not take the place of his cardiologist or PCP, but provides  an additional resource for him in managing his heart failure.    Patient stated he wants to be seen in the [facility] if it will help him.  This RN informed the patient an appointment in the [facility] has been made for him.  May 16, 2019 at 10:30 a.m.    Patient then shared with patient he had a fall in the bathroom this morning.  Patient stated he was on the toilet having a bowel movement and when he stood up he was dizzy and fell.  He has been assessed by his assigned nurse.  Patient sitting in bed and preparing to eat lunch. Patient stated, "I thought I would be discharged today, but I do not know if they will discharge me now or not."  As a result of the fall, patient's discharge for today will most likely be cancelled.    This RN encourage patient to eat lunch while warm.  Will continue to follow patient throughout hospitalization.     Roanna Epley, RN, BSN, [facility]  Cardiovascular & Pulmonary Nurse Navigator  Direct Line: [phone]  Department Phone #: [phone] Fax: [phone]  Email Address: [email or web address]

## 2019-04-29 NOTE — Discharge Instructions (Signed)
Follow with [name], NP in 5-7 days  Please get a complete blood count and chemistry panel checked by your Primary MD at your next visit, and again as instructed by your Primary MD. Please get your medications reviewed and adjusted by your Primary MD.  Please request your Primary MD to go over all Hospital Tests and Procedure/Radiological results at the follow up, please get all Hospital records sent to your Prim MD by signing hospital release before you go home.  In some cases, there will be blood work, cultures and biopsy results pending at the time of your discharge. Please request that your primary care M.D. goes through all the records of your hospital data and follows up on these results.  If you had Pneumonia of Lung problems at the Hospital: Please get a 2 view Chest X ray done in 6-8 weeks after hospital discharge or sooner if instructed by your Primary MD.  If you have Congestive Heart Failure: Please call your Cardiologist or Primary MD anytime you have any of the following symptoms:  1) 3 pound weight gain in 24 hours or 5 pounds in 1 week  2) shortness of breath, with or without a dry hacking cough  3) swelling in the hands, feet or stomach  4) if you have to sleep on extra pillows at night in order to breathe  Follow cardiac low salt diet and 1.5 lit/day fluid restriction.  If you have diabetes Accuchecks 4 times/day, Once in AM empty stomach and then before each meal. Log in all results and show them to your primary doctor at your next visit. If any glucose reading is under 80 or above 300 call your primary MD immediately.  If you have Seizure/Convulsions/Epilepsy: Please do not drive, operate heavy machinery, participate in activities at heights or participate in high speed sports until you have seen by Primary MD or a Neurologist and advised to do so again. Per [facility] statutes, patients with seizures are not allowed to drive until they have been  seizure-free for six months.  Use caution when using heavy equipment or power tools. Avoid working on ladders or at heights. Take showers instead of baths. Ensure the water temperature is not too high on the home water heater. Do not go swimming alone. Do not lock yourself in a room alone (i.e. bathroom). When caring for infants or small children, sit down when holding, feeding, or changing them to minimize risk of injury to the child in the event you have a seizure. Maintain good sleep hygiene. Avoid alcohol.   If you had Gastrointestinal Bleeding: Please ask your Primary MD to check a complete blood count within one week of discharge or at your next visit. Your endoscopic/colonoscopic biopsies that are pending at the time of discharge, will also need to followed by your Primary MD.  Get Medicines reviewed and adjusted. Please take all your medications with you for your next visit with your Primary MD  Please request your Primary MD to go over all hospital tests and procedure/radiological results at the follow up, please ask your Primary MD to get all Hospital records sent to his/her office.  If you experience worsening of your admission symptoms, develop shortness of breath, life threatening emergency, suicidal or homicidal thoughts you must seek medical attention immediately by calling 911 or calling your MD immediately  if symptoms less severe.  You must read complete instructions/literature along with all the possible adverse reactions/side effects for all the Medicines you  take and that have been prescribed to you. Take any new Medicines after you have completely understood and accpet all the possible adverse reactions/side effects.   Do not drive or operate heavy machinery when taking Pain medications.   Do not take more than prescribed Pain, Sleep and Anxiety Medications  Special Instructions: If you have smoked or chewed Tobacco  in the last 2 yrs please stop smoking, stop any regular  Alcohol  and or any Recreational drug use.  Wear Seat belts while driving.  Please note You were cared for by a hospitalist during your hospital stay. If you have any questions about your discharge medications or the care you received while you were in the hospital after you are discharged, you can call the unit and asked to speak with the hospitalist on call if the hospitalist that took care of you is not available. Once you are discharged, your primary care physician will handle any further medical issues. Please note that NO REFILLS for any discharge medications will be authorized once you are discharged, as it is imperative that you return to your primary care physician (or establish a relationship with a primary care physician if you do not have one) for your aftercare needs so that they can reassess your need for medications and monitor your lab values.  You can reach the hospitalist office at phone [phone] or fax [phone]   If you do not have a primary care physician, you can call [phone] for a physician referral.  Activity: As tolerated with Full fall precautions use walker/cane & assistance as needed    Diet: low sodium diet  Disposition Home

## 2019-04-29 NOTE — Care Management Important Message (Signed)
Important Message  Patient Details  Name: Larry Callahan MRN: [id number] Date of Birth: 06/25/1955   Medicare Important Message Given:  Yes     Dannette Barbara 04/29/2019, 12:20 PM

## 2019-04-29 NOTE — Progress Notes (Signed)
   04/29/19 1039  What Happened  Was fall witnessed? No  Was patient injured? No  Patient found other (Comment) (pt found in the bed/pt got back up and walked to the bed)  Found by No one-pt stated they fell  Stated prior activity ambulating-unassisted  Follow Up  MD notified Dr. Cruzita Lederer   Time MD notified 44  Family notified Yes - comment (pt called his own family )  Time family notified 1042  Additional tests Yes-comment (hip X-ray)  Simple treatment Other (comment) (pt refused ice treatment )

## 2019-04-29 NOTE — Progress Notes (Signed)
Pt had an unwitnessed fall around 10:35. Pt called out to the desk and reported that he had fallen on his way to the bathroom. When this RN went to check on the pt I found him sitting in the bed. Pt stated, "I was on my way to the bathroom to have a BM and got dizzy and fell." Pt c/o right hip pain 6/10. PRN tylenol given for pain. Otherwise VSS and CBG was 208. Pt was A&Ox4. No injury to skin noted. Family notified and MD made aware. MD ordered right hip xray. Pt had yellow socks on but bed alarm was not on as pt was independent in the room and had walked around the nurses station unassisted earlier in the day. No medications given within 1 hour prior to fall. MD ordered orthostatic VS to be done which pt was negative for orthostatic hypotension. Fall risk bracelet applied. Pt refused to have a low bed but floor mats were placed and bed alarm was activated. Will continue to monitor.

## 2019-04-29 NOTE — Progress Notes (Deleted)
   04/29/19 1039  What Happened  Was fall witnessed? No  Was patient injured? No  Patient found other (Comment) (pt got back up and walked to the bed)  Found by No one-pt stated they fell  Stated prior activity ambulating-unassisted  Follow Up  MD notified Dr. Cruzita Lederer   Time MD notified 34  Family notified Yes - comment (pt called his own family )  Time family notified 1042  Additional tests Yes-comment (hip X-ray)  Simple treatment Other (comment) (pt refused ice treatment )

## 2019-04-29 NOTE — Progress Notes (Signed)
Inpatient Diabetes Program Recommendations  AACE/ADA: New Consensus Statement on Inpatient Glycemic Control   Target Ranges:  Prepandial:   less than 140 mg/dL      Peak postprandial:   less than 180 mg/dL (1-2 hours)      Critically ill patients:  140 - 180 mg/dL   Results for HAZIEL, MOLNER (MRN [id number]) as of 04/29/2019 12:08  Ref. Range 04/28/2019 08:17 04/28/2019 11:47 04/28/2019 17:01 04/28/2019 21:26 04/29/2019 07:39  Glucose-Capillary Latest Ref Range: 70 - 99 mg/dL 89 168 (H) 349 (H) 338 (H) 165 (H)  Results for JONTA, GASTINEAU (MRN [id number]) as of 04/29/2019 12:08  Ref. Range 03/01/2019 17:12  Hemoglobin A1C Latest Ref Range: 4.8 - 5.6 % 6.5 (H)   Review of Glycemic Control  Diabetes history: DM2 Outpatient Diabetes medications: Amaryl 2 mg QAM, Toujeo 26 units QPM Current orders for Inpatient glycemic control: Lantus 20 units QHS, Novolog 0-9 units TID with meals, Novolog 0-5 units QHS; Prednisone 40 mg QAM  Inpatient Diabetes Program Recommendations:   Insulin-Meal Coverage: If steroids are continued, please consider ordering Novolog 4 units TID with meals for meal coverage if patient eats at least 50% of meals.  Thanks, Barnie Alderman, RN, MSN, CDE Diabetes Coordinator Inpatient Diabetes Program [phone] (Team Pager from 8am to 5pm)

## 2019-04-29 NOTE — Patient Outreach (Signed)
[facility]) Care Management  04/29/2019  Larry Callahan 1955/07/14 [id number]   Multiple attempts to establish contact with patient without success. No response from letter mailed to patient.   Plan: RN CM will close case at this time.   Jone Baseman, RN, MSN [place] [facility] Care Management Coordinator Direct Line [phone] Cell [phone] Toll Free: [phone]  Fax: [phone]

## 2019-04-30 DIAGNOSIS — G4733 Obstructive sleep apnea (adult) (pediatric): Secondary | ICD-10-CM

## 2019-04-30 DIAGNOSIS — W19XXXD Unspecified fall, subsequent encounter: Secondary | ICD-10-CM

## 2019-04-30 LAB — GLUCOSE, CAPILLARY: Glucose-Capillary: 110 mg/dL — ABNORMAL HIGH (ref 70–99)

## 2019-04-30 NOTE — Progress Notes (Signed)
Patient discharged home via ride from sister. IV removed, follow up appointment information provided along with prescription information and education. Patient educated about importance of fluid restriction and weighing himself daily.

## 2019-04-30 NOTE — Plan of Care (Signed)
Problem: Education: Goal: Knowledge of General Education information will improve Description: Including pain rating scale, medication(s)/side effects and non-pharmacologic comfort measures 04/30/2019 1000 by Alen Blew, RN Outcome: Adequate for Discharge 04/30/2019 1000 by Alen Blew, RN Outcome: Adequate for Discharge   Problem: Health Behavior/Discharge Planning: Goal: Ability to manage health-related needs will improve 04/30/2019 1000 by Alen Blew, RN Outcome: Adequate for Discharge 04/30/2019 1000 by Alen Blew, RN Outcome: Adequate for Discharge   Problem: Clinical Measurements: Goal: Ability to maintain clinical measurements within normal limits will improve 04/30/2019 1000 by Roanna Epley D, RN Outcome: Adequate for Discharge 04/30/2019 1000 by Alen Blew, RN Outcome: Adequate for Discharge Goal: Will remain free from infection 04/30/2019 1000 by Alen Blew, RN Outcome: Adequate for Discharge 04/30/2019 1000 by Alen Blew, RN Outcome: Adequate for Discharge Goal: Diagnostic test results will improve 04/30/2019 1000 by Alen Blew, RN Outcome: Adequate for Discharge 04/30/2019 1000 by Alen Blew, RN Outcome: Adequate for Discharge Goal: Respiratory complications will improve 04/30/2019 1000 by Alen Blew, RN Outcome: Adequate for Discharge 04/30/2019 1000 by Alen Blew, RN Outcome: Adequate for Discharge Goal: Cardiovascular complication will be avoided 04/30/2019 1000 by Alen Blew, RN Outcome: Adequate for Discharge 04/30/2019 1000 by Alen Blew, RN Outcome: Adequate for Discharge   Problem: Activity: Goal: Risk for activity intolerance will decrease 04/30/2019 1000 by Alen Blew, RN Outcome: Adequate for Discharge 04/30/2019 1000 by Alen Blew, RN Outcome: Adequate for Discharge   Problem: Nutrition: Goal: Adequate nutrition will be maintained 04/30/2019 1000 by  Alen Blew, RN Outcome: Adequate for Discharge 04/30/2019 1000 by Alen Blew, RN Outcome: Adequate for Discharge   Problem: Activity: Goal: Risk for activity intolerance will decrease 04/30/2019 1000 by Alen Blew, RN Outcome: Adequate for Discharge 04/30/2019 1000 by Alen Blew, RN Outcome: Adequate for Discharge   Problem: Nutrition: Goal: Adequate nutrition will be maintained 04/30/2019 1000 by Alen Blew, RN Outcome: Adequate for Discharge 04/30/2019 1000 by Alen Blew, RN Outcome: Adequate for Discharge   Problem: Coping: Goal: Level of anxiety will decrease 04/30/2019 1000 by Alen Blew, RN Outcome: Adequate for Discharge 04/30/2019 1000 by Alen Blew, RN Outcome: Adequate for Discharge   Problem: Elimination: Goal: Will not experience complications related to bowel motility 04/30/2019 1000 by Alen Blew, RN Outcome: Adequate for Discharge 04/30/2019 1000 by Alen Blew, RN Outcome: Adequate for Discharge Goal: Will not experience complications related to urinary retention 04/30/2019 1000 by Alen Blew, RN Outcome: Adequate for Discharge 04/30/2019 1000 by Alen Blew, RN Outcome: Adequate for Discharge   Problem: Skin Integrity: Goal: Risk for impaired skin integrity will decrease 04/30/2019 1000 by Alen Blew, RN Outcome: Adequate for Discharge 04/30/2019 1000 by Alen Blew, RN Outcome: Adequate for Discharge   Problem: Education: Goal: Ability to demonstrate management of disease process will improve 04/30/2019 1000 by Alen Blew, RN Outcome: Adequate for Discharge 04/30/2019 1000 by Alen Blew, RN Outcome: Adequate for Discharge Goal: Ability to verbalize understanding of medication therapies will improve 04/30/2019 1000 by Alen Blew, RN Outcome: Adequate for Discharge 04/30/2019 1000 by Alen Blew, RN Outcome: Adequate for Discharge Goal:  Individualized Educational Video(s) 04/30/2019 1000 by Alen Blew, RN Outcome: Adequate for Discharge 04/30/2019 1000 by Alen Blew, RN Outcome: Adequate for Discharge   Problem: Activity: Goal: Capacity to carry out activities will improve 04/30/2019  1000 by Alen Blew, RN Outcome: Adequate for Discharge 04/30/2019 1000 by Alen Blew, RN Outcome: Adequate for Discharge   Problem: Cardiac: Goal: Ability to achieve and maintain adequate cardiopulmonary perfusion will improve 04/30/2019 1000 by Alen Blew, RN Outcome: Adequate for Discharge 04/30/2019 1000 by Alen Blew, RN Outcome: Adequate for Discharge

## 2019-04-30 NOTE — Plan of Care (Signed)
  Problem: Education: Goal: Knowledge of General Education information will improve Description: Including pain rating scale, medication(s)/side effects and non-pharmacologic comfort measures Outcome: Adequate for Discharge   Problem: Health Behavior/Discharge Planning: Goal: Ability to manage health-related needs will improve Outcome: Adequate for Discharge   Problem: Clinical Measurements: Goal: Ability to maintain clinical measurements within normal limits will improve Outcome: Adequate for Discharge Goal: Will remain free from infection Outcome: Adequate for Discharge Goal: Diagnostic test results will improve Outcome: Adequate for Discharge Goal: Respiratory complications will improve Outcome: Adequate for Discharge Goal: Cardiovascular complication will be avoided Outcome: Adequate for Discharge   Problem: Activity: Goal: Capacity to carry out activities will improve Outcome: Adequate for Discharge   Problem: Cardiac: Goal: Ability to achieve and maintain adequate cardiopulmonary perfusion will improve Outcome: Adequate for Discharge   Problem: Education: Goal: Ability to demonstrate management of disease process will improve Outcome: Adequate for Discharge Goal: Ability to verbalize understanding of medication therapies will improve Outcome: Adequate for Discharge Goal: Individualized Educational Video(s) Outcome: Adequate for Discharge   Problem: Skin Integrity: Goal: Risk for impaired skin integrity will decrease Outcome: Adequate for Discharge   Problem: Elimination: Goal: Will not experience complications related to bowel motility Outcome: Adequate for Discharge Goal: Will not experience complications related to urinary retention Outcome: Adequate for Discharge   Problem: Activity: Goal: Risk for activity intolerance will decrease Outcome: Adequate for Discharge

## 2019-04-30 NOTE — Discharge Summary (Signed)
Physician Discharge Summary  Larry Callahan JSH:[id number] DOB: [birth year] DOA: 04/24/2019  PCP: [name], NP  Admit date: 04/24/2019 Discharge date: 04/30/2019  Admitted From: Home Disposition: Home  Recommendations for Outpatient Follow-up:  1. Follow up with PCP in 1-2 weeks 2. Follow-up with pulmonology and cardiology as scheduled  Home Health: None Equipment/Devices: None  Discharge Condition: Stable CODE STATUS: Full code Diet recommendation: Low-sodium  HPI: Per admitting MD, Larry Callahan is a 63 y.o. male with medical history significant of chronic kidney disease stage IIIb with a baseline creatinine 1.6-2.0, type 2 diabetes mellitus with most recent A1c 6.5 in September 8502, chronic diastolic CHF with most recent echo in September 2020 EF 55 to 60%, tobacco use (quit 3 months ago, COPD, recently hospitalized and discharged 10 days ago for COPD exacerbation, also found at that time to have unexplained iron deficiency anemia for which GI was consulted and underwent an upper endoscopy and colonoscopy (colonoscopy was very poor prep so plans were in place for him to get a colonoscopy as an outpatient).  He currently presents to the hospital with chief complaint of shortness of breath.  Over the last couple of days, he has been feeling more dyspneic with little activity, he is also been complaining of lower extremity swelling.  He reports compliance with his sodium intake however in my evaluation in the ED he is eating a bag of potato chips.  He denies abdominal pain, nausea or vomiting.  He does report small amount of blood in his stool yesterday for which she came to the ED last night but was discharged home.  He denies any fever or chills, no flulike illness.  He denies any sore throat.  He denies any sick contacts.  He denies any chest painED Course: In the emergency room his vital signs are stable, he is afebrile 98.9, blood pressure is stable on the  high side, satting 97% on room air.  His blood work reveals a potassium of 3.2, creatinine of 2.2, CBC shows a hemoglobin of 11 and otherwise unremarkable.  His BNP is 88, high-sensitivity troponin x21.  Chest x-ray on admission with vascular congestion and evidence of early pulmonary edema.  He was given IV Lasix and we are asked to admit  Hospital Course / Discharge diagnoses: Principal Problem Acute on chronic dyspnea, likely multifactorial due to acute on chronic diastolic CHF, also a degree of pulmonary edema on admission, underlying COPD, OSA, obesity and longstanding tobacco use -Has had dyspnea for few months but worsened in the last few days.  He is quite frustrated that he cannot feel like he was in the past.  Long discussion with the patient that his dyspnea is multifactorial, and he is getting treatment for each disease process that contributes however may have a degree of dyspnea at baseline that may never be reversed -He was evaluated by cardiology with a right heart cath just a month ago that did not show any significant volume overload.  He was volume overloaded during this admission given lower extremity edema, crackles and pulmonary edema on the chest x-ray, likely in the setting of dietary nonadherence -He is being followed by pulmonology as an outpatient as well -He was placed on Lasix while hospitalized and diuresed really well, net -11 L with stable renal function -Underwent a 2D echo which showed an EF of 45-50%, compared with the EF of 55 to 60% in September 2020.   -Cardiology was consulted and evaluated patient while hospitalized  recommending outpatient follow-up and continuation of his diuretics -He will also need follow-up with pulmonology as an outpatient  Active Problems Chronic kidney disease stage IIIb -His creatinine has progressively climbed over the last several months, now in the 2 range. -Creatinine has remained stable with Lasix, and 1.8 at the time of  discharge.  Would benefit from nephrology follow-up as an outpatient Type 2 diabetes mellitus -Resume home medications on discharge Iron deficiency anemia, history of GI bleed -Hemoglobin overall stable, no bleeding noted in the hospital, GI will follow-up as an outpatient Hypertension -Continue home medications COPD -had an episode of wheezing while hospitalized, improved with steroids, will give 3 additional days of prednisone on discharge. Coronary artery disease -had an episode of chest pain while hospitalized, cardiac enzymes negative.  Cardiology evaluated patient Intermittent dizziness with reported fall in hospital, unwitnessed, on 11/16-reported hip pain afterwards, x-ray negative.  CT scan of the brain negative.  He is not orthostatic.  This is resolved, and patient is ambulatory around the unit  Discharge Instructions  Discharge Instructions    AMB referral to pulmonary rehabilitation   Complete by: As directed    Please select a program: Respiratory Care Services Comment - Chronic Diastolic Heart Failure   Respiratory Care Services Diagnosis: Heart Failure Comment - OSA and COPD   After initial evaluation and assessments completed: Virtual Based Care may be provided alone or in conjunction with Pulmonary Rehab/Respiratory Care services based on patient barriers.: Yes     Allergies as of 04/30/2019      Reactions   Penicillins Rash      Medication List    TAKE these medications   acetaminophen 325 MG tablet Commonly known as: TYLENOL Take 2 tablets (650 mg total) by mouth every 4 (four) hours as needed for headache or mild pain.   albuterol 108 (90 Base) MCG/ACT inhaler Commonly known as: VENTOLIN HFA Inhale 2 puffs into the lungs every 6 (six) hours as needed for wheezing or shortness of breath.   amLODipine 10 MG tablet Commonly known as: NORVASC Take 1 tablet (10 mg total) by mouth daily.   aspirin EC 81 MG tablet Take 81 mg by mouth daily.   clobetasol cream  0.05 % Commonly known as: TEMOVATE Apply 1 application topically 2 (two) times daily.   ferrous gluconate 324 MG tablet Commonly known as: FERGON Take 1 tablet (324 mg total) by mouth daily with breakfast.   ferrous sulfate 325 (65 FE) MG EC tablet Take 1 tablet (325 mg total) by mouth 2 (two) times daily with a meal.   Fluticasone-Salmeterol 250-50 MCG/DOSE Aepb Commonly known as: Advair Diskus Inhale 1 puff into the lungs 2 (two) times daily.   furosemide 80 MG tablet Commonly known as: Lasix Take 1 tablet (80 mg total) by mouth daily.   gemfibrozil 600 MG tablet Commonly known as: LOPID Take 1 tablet (600 mg total) by mouth 2 (two) times daily with a meal.   glimepiride 2 MG tablet Commonly known as: AMARYL Take 1 tablet (2 mg total) by mouth daily before breakfast.   glucose blood test strip Commonly known as: True Metrix Blood Glucose Test Use as instructed twice daily diag E11.65   hydrALAZINE 100 MG tablet Commonly known as: APRESOLINE Take 1 tablet (100 mg total) by mouth 3 (three) times daily.   ipratropium-albuterol 0.5-2.5 (3) MG/3ML Soln Commonly known as: DUONEB Take 3 mLs by nebulization every 6 (six) hours as needed.   isosorbide mononitrate 30 MG 24 hr  tablet Commonly known as: IMDUR Take 1 tablet (30 mg total) by mouth 2 (two) times daily.   loratadine 10 MG tablet Commonly known as: CLARITIN Take 1 tablet (10 mg total) by mouth daily.   metoprolol succinate 50 MG 24 hr tablet Commonly known as: TOPROL-XL Take 1 tablet (50 mg total) by mouth at bedtime. Take with or immediately following a meal.   nicotine 21 mg/24hr patch Commonly known as: NICODERM CQ - dosed in mg/24 hours Place 1 patch (21 mg total) onto the skin daily.   NovoFine 32G X 6 MM Misc Generic drug: Insulin Pen Needle To use with [place] injections daily   omeprazole 40 MG capsule Commonly known as: PRILOSEC Take 1 capsule (40 mg total) by mouth daily.   predniSONE 20 MG  tablet Commonly known as: DELTASONE Take 1 tablet (20 mg total) by mouth daily.   Spiriva HandiHaler 18 MCG inhalation capsule Generic drug: tiotropium Place 1 capsule (18 mcg total) into inhaler and inhale daily.   Toujeo SoloStar 300 UNIT/ML Sopn Generic drug: Insulin Glargine (1 Unit Dial) Inject 26 Units into the skin every evening.   triamcinolone ointment 0.1 % Commonly known as: KENALOG Apply 1 application topically 2 (two) times daily.   True Metrix Air Glucose Meter Devi 1 Device by Does not apply route 2 (two) times daily.   True Metrix Level 1 Low Soln Use as directed   TRUEplus Lancets 33G Misc Use as directed twice daily diag E11.65      Follow-up Information    [facility].   Specialty: Cardiac Rehabilitation Why: Your physician has referred you to outpatient Pulmonary Rehab at [facility].  The Pulmonary Rehab department will contact you within one to two weeks of discharge to schedule  your first appointment.    Contact information: [place] [phone] [address] [place] [phone]       [place] On 05/16/2019.   Specialty: Cardiology Why: @ 10:30 a.m.   New patient appointment with Darylene Price, FNP in [facility].   Please enter through [facility].   Contact information: [place] [phone]          Consultations:  Cardiology   Procedures/Studies:  2D echo IMPRESSIONS  1. Left ventricular ejection fraction, by visual estimation, is 45 to 50%. The left ventricle has mildly decreased function. There is mildly increased left ventricular hypertrophy.  2. Left ventricular diastolic parameters are indeterminate.  3. Global right ventricle has normal systolic function.The right ventricular size is normal. No increase in right ventricular wall thickness.  4. Left atrial size  was moderately dilated.  5. Right atrial size was normal.  6. The mitral valve is normal in structure. No evidence of mitral valve regurgitation. No evidence of mitral stenosis.  7. The tricuspid valve is normal in structure. Tricuspid valve regurgitation is not demonstrated.  8. The aortic valve is normal in structure. Aortic valve regurgitation is not visualized. Mild to moderate aortic valve sclerosis/calcification without any evidence of aortic stenosis.  9. The pulmonic valve was normal in structure. Pulmonic valve regurgitation is not visualized. 10. TR signal is inadequate for assessing pulmonary artery systolic pressure. 11. The inferior vena cava is normal in size with greater than 50% respiratory variability, suggesting right atrial pressure of 3 mmHg.   Dg Chest 2 View  Result Date: 04/24/2019 CLINICAL DATA:  complaints of shortness  of breath, fatigue since this morning. Pt here for same recently, admission due to CHF. HTN, CHF, DM, former smoker EXAM: CHEST - 2 VIEW COMPARISON:  04/10/2019 FINDINGS: Mild central pulmonary vascular congestion and mild diffuse interstitial prominent, increased since previous. Heart size upper limits normal. Aortic Atherosclerosis (ICD10-170.0). No effusion. Anterior vertebral endplate spurring at multiple levels in the lower thoracic spine. IMPRESSION: Worsening central pulmonary vascular congestion and interstitial edema. Electronically Signed   By: Lucrezia Europe M.D.   On: 04/24/2019 12:25   Dg Chest 2 View  Result Date: 04/10/2019 CLINICAL DATA:  Shortness of breath EXAM: CHEST - 2 VIEW COMPARISON:  Radiograph 04/09/2019, CT 02/20/2019 FINDINGS: Hazy interstitial opacities with cephalized, indistinct vascularity. Cardiomediastinal contours are stable prior. No pneumothorax or effusion. No acute osseous or soft tissue abnormality. IMPRESSION: Appearance compatible with CHF with mild cardiomegaly and features of interstitial edema. Electronically Signed    By: Lovena Le M.D.   On: 04/10/2019 17:40   Dg Chest 2 View  Result Date: 04/09/2019 CLINICAL DATA:  Shortness of breath, hemoptysis EXAM: CHEST - 2 VIEW COMPARISON:  03/26/2019 FINDINGS: Mild cardiomegaly. Pulmonary vascular congestion with increased interstitial opacities in a perihilar and bibasilar distribution. No pleural effusion. No pneumothorax. IMPRESSION: Mild cardiomegaly with pulmonary vascular congestion suggesting CHF. Bilateral interstitial opacities suggest interstitial edema. Electronically Signed   By: Davina Poke M.D.   On: 04/09/2019 13:12   Ct Head Wo Contrast  Result Date: 04/29/2019 CLINICAL DATA:  Syncopal episode today with trauma to the right side of the head EXAM: CT HEAD WITHOUT CONTRAST TECHNIQUE: Contiguous axial images were obtained from the base of the skull through the vertex without intravenous contrast. COMPARISON:  03/26/2019 FINDINGS: Brain: The brain shows a normal appearance without evidence of malformation, atrophy, old or acute small or large vessel infarction, mass lesion, hemorrhage, hydrocephalus or extra-axial collection. Vascular: No hyperdense vessel. No evidence of atherosclerotic calcification. Skull: Normal.  No traumatic finding.  No focal bone lesion. Sinuses/Orbits: Sinuses are clear. Orbits appear normal. Mastoids are clear. Other: None significant IMPRESSION: Normal head CT Electronically Signed   By: Nelson Chimes M.D.   On: 04/29/2019 16:59   Nm Pulmonary Vent And Perf (v/q Scan)  Result Date: 04/09/2019 CLINICAL DATA:  Shortness of breath with hemoptysis EXAM: NUCLEAR MEDICINE VENTILATION - PERFUSION LUNG SCAN VIEWS: Anterior, posterior, left lateral, right lateral, RPO, LPO, RAO, LAO-ventilation and perfusion RADIOPHARMACEUTICALS:  30.10 mCi of Tc-72m DTPA aerosol inhalation and 4.03 mCi Tc80m MAA IV COMPARISON:  Chest radiograph April 09, 2019 FINDINGS: Ventilation: Radiotracer uptake is homogeneous and symmetric bilaterally. No  ventilation defects evident. Perfusion: Radiotracer uptake is homogeneous and symmetric bilaterally. No perfusion defects are evident. IMPRESSION: No appreciable ventilation or perfusion defects. Very low probability of pulmonary embolus. Electronically Signed   By: Lowella Grip III M.D.   On: 04/09/2019 16:32   US Venous Img Lower Bilateral  Result Date: 04/09/2019 CLINICAL DATA:  Hemoptysis, shortness of breath EXAM: BILATERAL LOWER EXTREMITY VENOUS DOPPLER ULTRASOUND TECHNIQUE: Gray-scale sonography with compression, as well as color and duplex ultrasound, were performed to evaluate the deep venous system from the level of the common femoral vein through the popliteal and proximal calf veins. COMPARISON:  None FINDINGS: Normal compressibility of the common femoral, superficial femoral, and popliteal veins, as well as the proximal calf veins. No filling defects to suggest DVT on grayscale or color Doppler imaging. Doppler waveforms show normal direction of venous flow, normal respiratory phasicity and response to augmentation. IMPRESSION:  No femoropopliteal and no calf DVT in the visualized calf veins. If clinical symptoms are inconsistent or if there are persistent or worsening symptoms, further imaging (possibly involving the iliac veins) may be warranted. Electronically Signed   By: Lucrezia Europe M.D.   On: 04/09/2019 17:14   Ct Renal Stone Study  Result Date: 04/24/2019 CLINICAL DATA:  Left flank pain. Generalized abdominal pain. Bloody stools. EXAM: CT ABDOMEN AND PELVIS WITHOUT CONTRAST TECHNIQUE: Multidetector CT imaging of the abdomen and pelvis was performed following the standard protocol without IV contrast. COMPARISON:  01/31/2014 CT abdomen/pelvis. FINDINGS: Lower chest: No significant pulmonary nodules or acute consolidative airspace disease. Hepatobiliary: Normal liver size. No liver mass. Cholecystectomy. No biliary ductal dilatation. Pancreas: Normal, with no mass or duct dilation.  Spleen: Normal size spleen. Granulomatous calcifications throughout the spleen. No splenic mass. Adrenals/Urinary Tract: Normal right adrenal. 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 Left nephrectomy with no mass or fluid collection in the left nephrectomy bed. No right hydronephrosis. No right renal stones. Multiple simple right renal cysts, largest 7.7 cm in the posterior lower right kidney. Normal caliber right ureter. No right ureteral stones. Normal bladder with no bladder stones. Stomach/Bowel: Normal non-distended stomach. Normal caliber small bowel with no small bowel wall thickening. Normal appendix. Marked left colonic diverticulosis, with no large bowel wall thickening or significant pericolonic fat stranding. Vascular/Lymphatic: Atherosclerotic nonaneurysmal abdominal aorta. No pathologically enlarged lymph nodes in the abdomen or pelvis. Reproductive: Mildly enlarged prostate. Other: No pneumoperitoneum, ascites or focal fluid collection. Moderate to large fat containing umbilical hernia, slightly increased from prior. Chronic diastasis of posterior left lateral abdominal wall. Musculoskeletal: No aggressive appearing focal osseous lesions. Moderate thoracolumbar spondylosis. IMPRESSION: 1. No acute abnormality. No evidence of bowel obstruction or acute bowel inflammation. Marked sigmoid diverticulosis, with no evidence of acute diverticulitis. 2. No right hydronephrosis.  No urolithiasis. 3. 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 Given the large size and continued growth, surgical consultation may be considered. 4. Left nephrectomy. 5. Mild prostatomegaly. 6. Moderate to large fat containing umbilical hernia, slightly increased. 7.  Aortic Atherosclerosis (ICD10-I70.0). Electronically Signed   By: Ilona Sorrel M.D.   On: 04/24/2019 16:17   Dg Hip Unilat With Pelvis 2-3 Views Left  Result Date: 04/29/2019 CLINICAL DATA:  Fall EXAM: DG HIP (WITH OR WITHOUT PELVIS) 2-3V LEFT COMPARISON:  None. FINDINGS: Anatomic alignment is maintained. There is no acute fracture. Mild changes of osteoarthritis. IMPRESSION: No acute fracture. Electronically Signed   By: Macy Mis M.D.   On: 04/29/2019 12:06   Dg Hip Unilat With Pelvis 2-3 Views Right  Result Date: 04/29/2019 CLINICAL DATA:  Fall EXAM: DG HIP (WITH OR WITHOUT PELVIS) 2-3V RIGHT COMPARISON:  2011 FINDINGS: Alignment is anatomic. There is no acute fracture. Changes of osteoarthritis are present similar to 2011. IMPRESSION: No acute fracture at the right hip. Electronically Signed   By: Macy Mis M.D.   On: 04/29/2019 12:03      Subjective: - no chest pain, shortness of breath, no abdominal pain, nausea or vomiting.   Discharge Exam: BP 131/87 (BP Location: Right Arm)    Pulse 60    Temp 97.7 F (36.5 C) (Oral)    Resp 18    Ht 5\' 9"  (1.753 m)  Wt 119.8 kg    SpO2 98%    BMI 39.02 kg/m   General: Pt is alert, awake, not in acute distress Cardiovascular: RRR, S1/S2 +, no rubs, no gallops Respiratory: CTA bilaterally, no wheezing, no rhonchi Abdominal: Soft, NT, ND, bowel sounds + Extremities: no edema, no cyanosis   The results of significant diagnostics from this hospitalization (including imaging, microbiology, ancillary and laboratory) are listed below for reference.     Microbiology: Recent Results (from the past 240 hour(s))  SARS CORONAVIRUS 2 (TAT 6-24 HRS) Nasopharyngeal Nasopharyngeal Swab     Status: None   Collection Time: 04/24/19  4:13 PM   Specimen: Nasopharyngeal Swab  Result Value Ref Range Status   SARS Coronavirus 2 NEGATIVE NEGATIVE Final    Comment: (NOTE) SARS-CoV-2 target nucleic acids are NOT DETECTED. The SARS-CoV-2 RNA is generally detectable in upper and lower respiratory specimens during the  acute phase of infection. Negative results do not preclude SARS-CoV-2 infection, do not rule out co-infections with other pathogens, and should not be used as the sole basis for treatment or other patient management decisions. Negative results must be combined with clinical observations, patient history, and epidemiological information. The expected result is Negative. Fact Sheet for Patients: SugarRoll.be Fact Sheet for Healthcare Providers: https://www.woods-mathews.com/ This test is not yet approved or cleared by the [place] FDA and  has been authorized for detection and/or diagnosis of SARS-CoV-2 by FDA under an Emergency Use Authorization (EUA). This EUA will remain  in effect (meaning this test can be used) for the duration of the COVID-19 declaration under Section 56 4(b)(1) of the Act, 21 U.S.C. section 360bbb-3(b)(1), unless the authorization is terminated or revoked sooner. Performed at [facility], [place] [address] [place], [place] [address]      Labs: Basic Metabolic Panel: Recent Labs  Lab 04/25/19 0443 04/26/19 0552 04/27/19 0519 04/28/19 0514 04/29/19 0513  NA 140 140 142 139 142  K 3.7 3.5 4.0 3.6 3.6  CL 107 106 106 106 108  CO2 25 24 26 23 24   GLUCOSE 103* 146* 170* 97 152*  BUN 44* 43* 44* 42* 41*  CREATININE 2.23* 2.12* 2.09* 1.89* 1.86*  CALCIUM 8.8* 8.6* 9.0 8.8* 9.1   Liver Function Tests: Recent Labs  Lab 04/23/19 1515  AST 22  ALT 18  ALKPHOS 58  BILITOT 0.5  PROT 7.3  ALBUMIN 3.3*   CBC: Recent Labs  Lab 04/23/19 1515 04/24/19 1122 04/26/19 0552 04/28/19 0514  WBC 8.3 8.2 5.8 5.7  HGB 10.6* 11.0* 9.6* 10.2*  HCT 34.1* 35.3* 30.8* 32.4*  MCV 84.8 84.9 86.0 84.6  PLT 255 238 190 208   CBG: Recent Labs  Lab 04/29/19 1050 04/29/19 1205 04/29/19 1703 04/29/19 2116 04/30/19 0800  GLUCAP 208* 179* 247* 244* 110*   Hgb A1c No results for input(s): HGBA1C in the last 72  hours. Lipid Profile No results for input(s): CHOL, HDL, LDLCALC, TRIG, CHOLHDL, LDLDIRECT in the last 72 hours. Thyroid function studies No results for input(s): TSH, T4TOTAL, T3FREE, THYROIDAB in the last 72 hours.  Invalid input(s): FREET3 Urinalysis    Component Value Date/Time   COLORURINE YELLOW (A) 04/23/2019 1852   APPEARANCEUR CLEAR (A) 04/23/2019 1852   APPEARANCEUR Cloudy (A) 11/23/2018 1103   LABSPEC 1.011 04/23/2019 1852   LABSPEC 1.005 03/06/2013 0929   PHURINE 5.0 04/23/2019 1852   GLUCOSEU NEGATIVE 04/23/2019 1852   GLUCOSEU Negative 03/06/2013 0929   HGBUR NEGATIVE 04/23/2019 1852   BILIRUBINUR NEGATIVE  04/23/2019 1852   BILIRUBINUR negative 02/20/2019 1404   BILIRUBINUR Negative 11/23/2018 1103   BILIRUBINUR Negative 03/06/2013 [place] 04/23/2019 1852   PROTEINUR 100 (A) 04/23/2019 1852   UROBILINOGEN 0.2 02/20/2019 1404   NITRITE NEGATIVE 04/23/2019 1852   LEUKOCYTESUR NEGATIVE 04/23/2019 1852   LEUKOCYTESUR Negative 03/06/2013 0929    FURTHER DISCHARGE INSTRUCTIONS:   Get Medicines reviewed and adjusted: Please take all your medications with you for your next visit with your Primary MD   Laboratory/radiological data: Please request your Primary MD to go over all hospital tests and procedure/radiological results at the follow up, please ask your Primary MD to get all Hospital records sent to his/her office.   In some cases, they will be blood work, cultures and biopsy results pending at the time of your discharge. Please request that your primary care M.D. goes through all the records of your hospital data and follows up on these results.   Also Note the following: If you experience worsening of your admission symptoms, develop shortness of breath, life threatening emergency, suicidal or homicidal thoughts you must seek medical attention immediately by calling 911 or calling your MD immediately  if symptoms less severe.   You must read  complete instructions/literature along with all the possible adverse reactions/side effects for all the Medicines you take and that have been prescribed to you. Take any new Medicines after you have completely understood and accpet all the possible adverse reactions/side effects.    Do not drive when taking Pain medications or sleeping medications (Benzodaizepines)   Do not take more than prescribed Pain, Sleep and Anxiety Medications. It is not advisable to combine anxiety,sleep and pain medications without talking with your primary care practitioner   Special Instructions: If you have smoked or chewed Tobacco  in the last 2 yrs please stop smoking, stop any regular Alcohol  and or any Recreational drug use.   Wear Seat belts while driving.   Please note: You were cared for by a hospitalist during your hospital stay. Once you are discharged, your primary care physician will handle any further medical issues. Please note that NO REFILLS for any discharge medications will be authorized once you are discharged, as it is imperative that you return to your primary care physician (or establish a relationship with a primary care physician if you do not have one) for your post hospital discharge needs so that they can reassess your need for medications and monitor your lab values.  Time coordinating discharge: 35 minutes  SIGNED:  Marzetta Board, MD, PhD 04/30/2019, 9:07 AM

## 2019-04-30 NOTE — TOC Transition Note (Signed)
Transition of Care [facility]) - CM/SW Discharge Note   Patient Details  Name: Dontrey Snellgrove MRN: [id number] Date of Birth: 09-15-1955  [facility] of [facility] CM/SW Contact:  Ross Ludwig, LCSW Phone Number: 04/30/2019, 10:24 AM   Clinical Narrative:    Patient will be discharging back home, patient states he does not have any other needs for equipment and home health.   Final next level of care: Home/Self Care Barriers to Discharge: Barriers Resolved   Patient Goals and CMS Choice Patient states their goals for this hospitalization and ongoing recovery are:: To return back home CMS Medicare.gov Compare Post Acute Care list provided to:: Patient Choice offered to / list presented to : Patient  Discharge Placement  Patient will be discharging back home, patient did not need any home health or equipment.                Discharge Plan and Services   Discharge Planning Services: CM Consult Post Acute Care Choice: Durable Medical Equipment, Home Health          DME Arranged: N/A DME Agency: NA       HH Arranged: NA, Social Work [employer] Agency: NA        Social Determinants of Health (SDOH) Interventions     Readmission Risk Interventions Readmission Risk Prevention Plan 04/27/2019  Transportation Screening Complete  Medication Review Press photographer) Complete  PCP or Specialist appointment within 3-5 days of discharge Complete  HRI or [place] Complete  SW Recovery Care/Counseling Consult Not Complete  SW Consult Not Complete Comments not needed  Palliative Care Screening Not [place] Not Applicable  Some recent data might be hidden

## 2019-05-01 NOTE — Telephone Encounter (Signed)
Patients sister answered the phone and she is not on DPR. Requested that she please have him call us back.

## 2019-05-01 NOTE — Telephone Encounter (Signed)
Patient could not here recording per sister.    She wants nurse to call him and talk loud as he is [facility]

## 2019-05-01 NOTE — Telephone Encounter (Signed)
Spoke with patient and reviewed recommendations by provider. He states that when he gets very short of breath that he panics and just doesn't know what else to do other than go to ED. Recommended that he please reach out to his pulmonary physician for further recommendations as this is from his COPD. He verbalized understanding and states that he is doing ok for now. Recommended that he please call us if he should have any further concerns. He was appreciative for the call.

## 2019-05-03 ENCOUNTER — Ambulatory Visit: Payer: Medicare PPO | Admitting: Nurse Practitioner

## 2019-05-08 ENCOUNTER — Telehealth: Payer: Self-pay

## 2019-05-08 ENCOUNTER — Ambulatory Visit: Payer: Medicare PPO | Admitting: Adult Health

## 2019-05-08 NOTE — Telephone Encounter (Signed)
Patient cancelled appointment for today stated fluid is going away in legs. klh

## 2019-05-09 ENCOUNTER — Emergency Department: Payer: Medicare PPO

## 2019-05-09 ENCOUNTER — Inpatient Hospital Stay
Admission: EM | Admit: 2019-05-09 | Discharge: 2019-05-13 | DRG: 204 | Disposition: A | Discharge status: Home / Self Care | Payer: Medicare PPO | Attending: Internal Medicine | Admitting: Internal Medicine

## 2019-05-09 ENCOUNTER — Other Ambulatory Visit: Payer: Self-pay

## 2019-05-09 DIAGNOSIS — N189 Chronic kidney disease, unspecified: Secondary | ICD-10-CM

## 2019-05-09 DIAGNOSIS — D509 Iron deficiency anemia, unspecified: Secondary | ICD-10-CM | POA: Diagnosis present

## 2019-05-09 DIAGNOSIS — J449 Chronic obstructive pulmonary disease, unspecified: Secondary | ICD-10-CM | POA: Diagnosis not present

## 2019-05-09 DIAGNOSIS — Z801 Family history of malignant neoplasm of trachea, bronchus and lung: Secondary | ICD-10-CM | POA: Diagnosis not present

## 2019-05-09 DIAGNOSIS — Z6839 Body mass index (BMI) 39.0-39.9, adult: Secondary | ICD-10-CM | POA: Diagnosis not present

## 2019-05-09 DIAGNOSIS — E782 Mixed hyperlipidemia: Secondary | ICD-10-CM | POA: Diagnosis present

## 2019-05-09 DIAGNOSIS — R042 Hemoptysis: Principal | ICD-10-CM | POA: Diagnosis present

## 2019-05-09 DIAGNOSIS — I2510 Atherosclerotic heart disease of native coronary artery without angina pectoris: Secondary | ICD-10-CM | POA: Diagnosis present

## 2019-05-09 DIAGNOSIS — I5032 Chronic diastolic (congestive) heart failure: Secondary | ICD-10-CM | POA: Diagnosis present

## 2019-05-09 DIAGNOSIS — K2970 Gastritis, unspecified, without bleeding: Secondary | ICD-10-CM | POA: Diagnosis present

## 2019-05-09 DIAGNOSIS — Z905 Acquired absence of kidney: Secondary | ICD-10-CM

## 2019-05-09 DIAGNOSIS — Z8249 Family history of ischemic heart disease and other diseases of the circulatory system: Secondary | ICD-10-CM

## 2019-05-09 DIAGNOSIS — Z87891 Personal history of nicotine dependence: Secondary | ICD-10-CM | POA: Diagnosis not present

## 2019-05-09 DIAGNOSIS — Z880 Allergy status to penicillin: Secondary | ICD-10-CM

## 2019-05-09 DIAGNOSIS — R4189 Other symptoms and signs involving cognitive functions and awareness: Secondary | ICD-10-CM | POA: Diagnosis present

## 2019-05-09 DIAGNOSIS — R0789 Other chest pain: Secondary | ICD-10-CM | POA: Diagnosis not present

## 2019-05-09 DIAGNOSIS — Z833 Family history of diabetes mellitus: Secondary | ICD-10-CM | POA: Diagnosis not present

## 2019-05-09 DIAGNOSIS — N1832 Chronic kidney disease, stage 3b: Secondary | ICD-10-CM | POA: Diagnosis present

## 2019-05-09 DIAGNOSIS — N179 Acute kidney failure, unspecified: Secondary | ICD-10-CM | POA: Diagnosis present

## 2019-05-09 DIAGNOSIS — Z20828 Contact with and (suspected) exposure to other viral communicable diseases: Secondary | ICD-10-CM | POA: Diagnosis present

## 2019-05-09 DIAGNOSIS — K2100 Gastro-esophageal reflux disease with esophagitis, without bleeding: Secondary | ICD-10-CM | POA: Diagnosis present

## 2019-05-09 DIAGNOSIS — I509 Heart failure, unspecified: Secondary | ICD-10-CM

## 2019-05-09 DIAGNOSIS — I5022 Chronic systolic (congestive) heart failure: Secondary | ICD-10-CM

## 2019-05-09 DIAGNOSIS — Z7951 Long term (current) use of inhaled steroids: Secondary | ICD-10-CM

## 2019-05-09 DIAGNOSIS — Z808 Family history of malignant neoplasm of other organs or systems: Secondary | ICD-10-CM

## 2019-05-09 DIAGNOSIS — Z7982 Long term (current) use of aspirin: Secondary | ICD-10-CM

## 2019-05-09 DIAGNOSIS — I10 Essential (primary) hypertension: Secondary | ICD-10-CM | POA: Diagnosis not present

## 2019-05-09 DIAGNOSIS — E1122 Type 2 diabetes mellitus with diabetic chronic kidney disease: Secondary | ICD-10-CM | POA: Diagnosis present

## 2019-05-09 DIAGNOSIS — J439 Emphysema, unspecified: Secondary | ICD-10-CM | POA: Diagnosis not present

## 2019-05-09 DIAGNOSIS — R609 Edema, unspecified: Secondary | ICD-10-CM | POA: Diagnosis not present

## 2019-05-09 DIAGNOSIS — R079 Chest pain, unspecified: Secondary | ICD-10-CM | POA: Diagnosis not present

## 2019-05-09 DIAGNOSIS — R0602 Shortness of breath: Secondary | ICD-10-CM | POA: Diagnosis not present

## 2019-05-09 DIAGNOSIS — G4733 Obstructive sleep apnea (adult) (pediatric): Secondary | ICD-10-CM | POA: Diagnosis present

## 2019-05-09 DIAGNOSIS — Z794 Long term (current) use of insulin: Secondary | ICD-10-CM | POA: Diagnosis not present

## 2019-05-09 DIAGNOSIS — I130 Hypertensive heart and chronic kidney disease with heart failure and stage 1 through stage 4 chronic kidney disease, or unspecified chronic kidney disease: Secondary | ICD-10-CM | POA: Diagnosis present

## 2019-05-09 DIAGNOSIS — E1142 Type 2 diabetes mellitus with diabetic polyneuropathy: Secondary | ICD-10-CM | POA: Diagnosis present

## 2019-05-09 DIAGNOSIS — J441 Chronic obstructive pulmonary disease with (acute) exacerbation: Secondary | ICD-10-CM | POA: Diagnosis present

## 2019-05-09 DIAGNOSIS — Z7952 Long term (current) use of systemic steroids: Secondary | ICD-10-CM

## 2019-05-09 DIAGNOSIS — K2980 Duodenitis without bleeding: Secondary | ICD-10-CM | POA: Diagnosis present

## 2019-05-09 DIAGNOSIS — Z79899 Other long term (current) drug therapy: Secondary | ICD-10-CM

## 2019-05-09 DIAGNOSIS — M7989 Other specified soft tissue disorders: Secondary | ICD-10-CM | POA: Diagnosis present

## 2019-05-09 LAB — COMPREHENSIVE METABOLIC PANEL
ALT: 18 U/L (ref 0–44)
AST: 25 U/L (ref 15–41)
Albumin: 3.1 g/dL — ABNORMAL LOW (ref 3.5–5.0)
Alkaline Phosphatase: 60 U/L (ref 38–126)
Anion gap: 12 (ref 5–15)
BUN: 46 mg/dL — ABNORMAL HIGH (ref 8–23)
CO2: 20 mmol/L — ABNORMAL LOW (ref 22–32)
Calcium: 9.1 mg/dL (ref 8.9–10.3)
Chloride: 108 mmol/L (ref 98–111)
Creatinine, Ser: 2.25 mg/dL — ABNORMAL HIGH (ref 0.61–1.24)
GFR calc Af Amer: 34 mL/min — ABNORMAL LOW (ref >60)
GFR calc non Af Amer: 29 mL/min — ABNORMAL LOW (ref >60)
Glucose, Bld: 174 mg/dL — ABNORMAL HIGH (ref 70–99)
Potassium: 3.7 mmol/L (ref 3.5–5.1)
Sodium: 140 mmol/L (ref 135–145)
Total Bilirubin: 0.6 mg/dL (ref 0.3–1.2)
Total Protein: 6.7 g/dL (ref 6.5–8.1)

## 2019-05-09 LAB — POC SARS CORONAVIRUS 2 AG: SARS Coronavirus 2 Ag: NEGATIVE

## 2019-05-09 LAB — CBC
HCT: 32.2 % — ABNORMAL LOW (ref 39.0–52.0)
Hemoglobin: 10.6 g/dL — ABNORMAL LOW (ref 13.0–17.0)
MCH: 27.7 pg (ref 26.0–34.0)
MCHC: 32.9 g/dL (ref 30.0–36.0)
MCV: 84.3 fL (ref 80.0–100.0)
Platelets: 222 10*3/uL (ref 150–400)
RBC: 3.82 MIL/uL — ABNORMAL LOW (ref 4.22–5.81)
RDW: 17.7 % — ABNORMAL HIGH (ref 11.5–15.5)
WBC: 7.3 10*3/uL (ref 4.0–10.5)
nRBC: 0 % (ref 0.0–0.2)

## 2019-05-09 LAB — HEMOGLOBIN A1C
Hgb A1c MFr Bld: 6.8 % — ABNORMAL HIGH (ref 4.8–5.6)
Mean Plasma Glucose: 148.46 mg/dL

## 2019-05-09 LAB — GLUCOSE, CAPILLARY
Glucose-Capillary: 174 mg/dL — ABNORMAL HIGH (ref 70–99)
Glucose-Capillary: 324 mg/dL — ABNORMAL HIGH (ref 70–99)
Glucose-Capillary: 303 mg/dL — ABNORMAL HIGH (ref 70–99)

## 2019-05-09 LAB — TROPONIN I (HIGH SENSITIVITY): Troponin I (High Sensitivity): 15 ng/L (ref <18)

## 2019-05-09 LAB — LIPASE, BLOOD: Lipase: 40 U/L (ref 11–51)

## 2019-05-09 MED ORDER — HYDRALAZINE HCL 50 MG PO TABS
100.0000 mg | ORAL_TABLET | Freq: Three times a day (TID) | ORAL | Status: DC
  Administered 2019-05-09 – 2019-05-13 (×12): 100 mg via ORAL
  Filled 2019-05-09 (×12): qty 2

## 2019-05-09 MED ORDER — ONDANSETRON HCL 4 MG/2ML IJ SOLN: 4.0000 mg | Freq: Four times a day (QID) | INTRAMUSCULAR | Status: DC | PRN

## 2019-05-09 MED ORDER — FERROUS SULFATE 325 (65 FE) MG PO TABS
325.0000 mg | ORAL_TABLET | Freq: Two times a day (BID) | ORAL | Status: DC
  Administered 2019-05-09 – 2019-05-13 (×8): 325 mg via ORAL
  Filled 2019-05-09 (×9): qty 1

## 2019-05-09 MED ORDER — PREDNISONE 20 MG PO TABS
20.0000 mg | ORAL_TABLET | Freq: Every day | ORAL | Status: DC
  Administered 2019-05-09 – 2019-05-13 (×5): 20 mg via ORAL
  Filled 2019-05-09 (×5): qty 1

## 2019-05-09 MED ORDER — PANTOPRAZOLE SODIUM 40 MG PO TBEC: 40.0000 mg | DELAYED_RELEASE_TABLET | Freq: Every day | ORAL | Status: DC

## 2019-05-09 MED ORDER — INSULIN ASPART 100 UNIT/ML ~~LOC~~ SOLN
2.0000 [IU] | Freq: Three times a day (TID) | SUBCUTANEOUS | Status: DC
  Administered 2019-05-09 – 2019-05-13 (×9): 2 [IU] via SUBCUTANEOUS
  Filled 2019-05-09 (×9): qty 1

## 2019-05-09 MED ORDER — PANTOPRAZOLE SODIUM 40 MG IV SOLR
40.0000 mg | Freq: Two times a day (BID) | INTRAVENOUS | Status: DC
  Administered 2019-05-09 – 2019-05-12 (×8): 40 mg via INTRAVENOUS
  Filled 2019-05-09 (×8): qty 40

## 2019-05-09 MED ORDER — METOPROLOL SUCCINATE ER 50 MG PO TB24
50.0000 mg | ORAL_TABLET | Freq: Every day | ORAL | Status: DC
  Administered 2019-05-09 – 2019-05-12 (×4): 50 mg via ORAL
  Filled 2019-05-09 (×4): qty 1

## 2019-05-09 MED ORDER — SODIUM CHLORIDE 0.9 % IV SOLN
INTRAVENOUS | Status: AC
  Administered 2019-05-09 – 2019-05-10 (×2): via INTRAVENOUS

## 2019-05-09 MED ORDER — ISOSORBIDE MONONITRATE ER 30 MG PO TB24
30.0000 mg | ORAL_TABLET | Freq: Two times a day (BID) | ORAL | Status: DC
  Administered 2019-05-09 – 2019-05-12 (×8): 30 mg via ORAL
  Filled 2019-05-09 (×8): qty 1

## 2019-05-09 MED ORDER — AMLODIPINE BESYLATE 10 MG PO TABS
10.0000 mg | ORAL_TABLET | Freq: Every day | ORAL | Status: DC
  Administered 2019-05-09 – 2019-05-13 (×5): 10 mg via ORAL
  Filled 2019-05-09 (×5): qty 1

## 2019-05-09 MED ORDER — MOMETASONE FURO-FORMOTEROL FUM 200-5 MCG/ACT IN AERO
2.0000 | INHALATION_SPRAY | Freq: Two times a day (BID) | RESPIRATORY_TRACT | Status: DC
  Administered 2019-05-09 – 2019-05-13 (×8): 2 via RESPIRATORY_TRACT
  Filled 2019-05-09: qty 8.8

## 2019-05-09 MED ORDER — ACETAMINOPHEN 325 MG PO TABS: 650.0000 mg | ORAL_TABLET | Freq: Four times a day (QID) | ORAL | Status: DC | PRN

## 2019-05-09 MED ORDER — INSULIN ASPART 100 UNIT/ML ~~LOC~~ SOLN
0.0000 [IU] | Freq: Three times a day (TID) | SUBCUTANEOUS | Status: DC
  Administered 2019-05-09 – 2019-05-10 (×2): 3 [IU] via SUBCUTANEOUS
  Administered 2019-05-10: 2 [IU] via SUBCUTANEOUS
  Administered 2019-05-10: 5 [IU] via SUBCUTANEOUS
  Administered 2019-05-11: 11 [IU] via SUBCUTANEOUS
  Administered 2019-05-11: 5 [IU] via SUBCUTANEOUS
  Administered 2019-05-12: 8 [IU] via SUBCUTANEOUS
  Administered 2019-05-12: 15 [IU] via SUBCUTANEOUS
  Administered 2019-05-13: 8 [IU] via SUBCUTANEOUS
  Filled 2019-05-09 (×10): qty 1

## 2019-05-09 MED ORDER — INSULIN GLARGINE 100 UNIT/ML ~~LOC~~ SOLN
26.0000 [IU] | Freq: Every evening | SUBCUTANEOUS | Status: DC
  Administered 2019-05-09 – 2019-05-11 (×3): 26 [IU] via SUBCUTANEOUS
  Filled 2019-05-09 (×5): qty 0.26

## 2019-05-09 MED ORDER — SENNOSIDES-DOCUSATE SODIUM 8.6-50 MG PO TABS: 2.0000 | ORAL_TABLET | Freq: Every evening | ORAL | Status: DC | PRN

## 2019-05-09 MED ORDER — FUROSEMIDE 40 MG PO TABS
80.0000 mg | ORAL_TABLET | Freq: Every day | ORAL | Status: DC
  Administered 2019-05-09 – 2019-05-13 (×5): 80 mg via ORAL
  Filled 2019-05-09 (×5): qty 2

## 2019-05-09 MED ORDER — ACETAMINOPHEN 650 MG RE SUPP: 650.0000 mg | Freq: Four times a day (QID) | RECTAL | Status: DC | PRN

## 2019-05-09 MED ORDER — INSULIN ASPART 100 UNIT/ML ~~LOC~~ SOLN
0.0000 [IU] | Freq: Every day | SUBCUTANEOUS | Status: DC
  Administered 2019-05-09: 4 [IU] via SUBCUTANEOUS
  Administered 2019-05-10: 2 [IU] via SUBCUTANEOUS
  Administered 2019-05-11: 3 [IU] via SUBCUTANEOUS
  Administered 2019-05-12: 2 [IU] via SUBCUTANEOUS
  Filled 2019-05-09 (×4): qty 1

## 2019-05-09 MED ORDER — ONDANSETRON HCL 4 MG PO TABS: 4.0000 mg | ORAL_TABLET | Freq: Four times a day (QID) | ORAL | Status: DC | PRN

## 2019-05-09 MED ORDER — GEMFIBROZIL 600 MG PO TABS
600.0000 mg | ORAL_TABLET | Freq: Two times a day (BID) | ORAL | Status: DC
  Administered 2019-05-09 – 2019-05-13 (×8): 600 mg via ORAL
  Filled 2019-05-09 (×10): qty 1

## 2019-05-09 MED ORDER — IPRATROPIUM-ALBUTEROL 0.5-2.5 (3) MG/3ML IN SOLN
3.0000 mL | Freq: Four times a day (QID) | RESPIRATORY_TRACT | Status: DC
  Administered 2019-05-09: 3 mL via RESPIRATORY_TRACT
  Filled 2019-05-09: qty 3

## 2019-05-09 MED ORDER — METHYLPREDNISOLONE SODIUM SUCC 125 MG IJ SOLR
125.0000 mg | Freq: Once | INTRAMUSCULAR | Status: AC
  Administered 2019-05-09: 125 mg via INTRAVENOUS
  Filled 2019-05-09: qty 2

## 2019-05-09 MED ORDER — IPRATROPIUM-ALBUTEROL 20-100 MCG/ACT IN AERS: 1.0000 | INHALATION_SPRAY | Freq: Four times a day (QID) | RESPIRATORY_TRACT | Status: DC

## 2019-05-09 MED ORDER — POLYETHYLENE GLYCOL 3350 17 G PO PACK: 17.0000 g | PACK | Freq: Every day | ORAL | Status: DC | PRN

## 2019-05-09 NOTE — ED Triage Notes (Signed)
Reports spitting up blood that started today and bilateral leg swelling. Reports nonproductive cough, states he is spitting up blood not coughing. Brought bright red rag to show doctor. Pt alert and oriented X4, cooperative, RR even and unlabored, color WNL. Pt in NAD.

## 2019-05-09 NOTE — H&P (Addendum)
History and Physical    Emauri Krygier Fusilier XNA:[id number] DOB: [birth year] DOA: 05/09/2019  PCP: [name], NP Patient coming from: Home  Chief Complaint: Spitting up blood  HPI: Ryoma Nofziger is a 63 y.o. male with medical history significant of CAD, COPD, essential hypertension, obesity, iron deficiency anemia presented to the hospital with complaints of hemoptysis.  Patient states over the course the last several days he has had few episodes of hemoptysis/blood in his spit but denies much of coughing, fevers chills or other complaints.  But this morning he had couple of episodes which were little bit more than usual therefore came to the hospital. In the ER he had couple of small episodes in a tissue as well.  CT of the chest without contrast was negative.  Was in mild AKI therefore did not receive contrast.  Baseline creatinine 1.8, admission creatinine 2.2.  He was admitted for overnight observation.  Admitted here about 2 weeks ago for shortness of breath secondary to pulmonary edema requiring diuresis.  Cardiology had seen the patient at that time.  Had endoscopy and colonoscopy about a month ago which were both negative for any acute pathology.   Review of Systems: As per HPI otherwise 10 point review of systems negative.  Review of Systems Otherwise negative except as per HPI, including: General: Denies fever, chills, night sweats or unintended weight loss. Resp: Denies cough, wheezing, shortness of breath. Cardiac: Denies chest pain, palpitations, orthopnea, paroxysmal nocturnal dyspnea. GI: Denies abdominal pain, nausea, vomiting, diarrhea or constipation GU: Denies dysuria, frequency, hesitancy or incontinence MS: Denies muscle aches, joint pain or swelling Neuro: Denies headache, neurologic deficits (focal weakness, numbness, tingling), abnormal gait Psych: Denies anxiety, depression, SI/HI/AVH Skin: Denies new rashes or lesions ID: Denies sick contacts,  exotic exposures, travel  Past Medical History:  Diagnosis Date  . Allergy   . Coronary artery disease   . Diabetes mellitus without complication ([place])   . Diastolic CHF ([place])   . GERD (gastroesophageal reflux disease)   . Hyperlipidemia   . Hypertension   . Renal insufficiency   . Sleep apnea     Past Surgical History:  Procedure Laterality Date  . BACK SURGERY    . CHOLECYSTECTOMY    . COLONOSCOPY WITH PROPOFOL N/A 04/12/2019   Procedure: COLONOSCOPY WITH PROPOFOL;  Surgeon: [name], MD;  Location: [facility] ENDOSCOPY;  Service: Gastroenterology;  Laterality: N/A;  . ESOPHAGOGASTRODUODENOSCOPY N/A 04/12/2019   Procedure: ESOPHAGOGASTRODUODENOSCOPY (EGD);  Surgeon: [name], MD;  Location[facility] END[facility]COPY;  Service: Gastroenterology;  Laterality: N/A;  . left arm surgery    . nephrectomy Left   . PARATHYROIDECTOMY    . RIGHT HEART CATH N/A 03/29/2019   Procedure: RIGHT HEART CATH;  Surgeon: [name], MD;  Location: [place] CV LAB;  Service: Cardiovascular;  Laterality: N/A;    SOCIAL HISTORY:  reports that he has quit smoking. He has quit using smokeless tobacco.  His smokeless tobacco use included chew. He reports that he does not drink alcohol or use drugs.  Allergies  Allergen Reactions  . Penicillins Rash    FAMILY HISTORY: Family History  Problem Relation Age of Onset  . Diabetes Mother   . Lung cancer Father   . Diabetes Sister   . Hypertension Sister   . Heart disease Sister   . Cancer Sister   . Bone cancer Brother      Prior to Admission medications   Medication Sig Start Date End Date  Taking? Authorizing Provider  acetaminophen (TYLENOL) 325 MG tablet Take 2 tablets (650 mg total) by mouth every 4 (four) hours as needed for headache or mild pain. 03/05/19  Yes Gouru, Illene Silver, MD  albuterol (VENTOLIN HFA) 108 (90 Base) MCG/ACT inhaler Inhale 2 puffs into the lungs every 6 (six) hours as needed for wheezing or shortness of  breath. 03/05/19  Yes Gouru, Illene Silver, MD  amLODipine (NORVASC) 10 MG tablet Take 1 tablet (10 mg total) by mouth daily. 03/12/19  Yes [name], MD  aspirin EC 81 MG tablet Take 81 mg by mouth daily.   Yes [provider]  Blood Glucose Calibration (TRUE METRIX LEVEL 1) Low SOLN Use as directed 04/04/18  Yes Boscia, Heather E, NP  Blood Glucose Monitoring Suppl (TRUE METRIX AIR GLUCOSE METER) DEVI 1 Device by Does not apply route 2 (two) times daily. 04/13/18  Yes Boscia, Greer Ee, NP  clobetasol cream (TEMOVATE) 2.68 % Apply 1 application topically 2 (two) times daily. 12/31/18  Yes [name], NP  ferrous sulfate 325 (65 FE) MG EC tablet Take 1 tablet (325 mg total) by mouth 2 (two) times daily with a meal. 04/13/19 05/13/19 Yes Sudini, Srikar, MD  Fluticasone-Salmeterol (ADVAIR DISKUS) 250-50 MCG/DOSE AEPB Inhale 1 puff into the lungs 2 (two) times daily. 03/05/19 03/04/20 Yes Gouru, Illene Silver, MD  furosemide (LASIX) 80 MG tablet Take 1 tablet (80 mg total) by mouth daily. 04/13/19  Yes Hillary Bow, MD  gemfibrozil (LOPID) 600 MG tablet Take 1 tablet (600 mg total) by mouth 2 (two) times daily with a meal. 02/28/19  Yes Boscia, Heather E, NP  glimepiride (AMARYL) 2 MG tablet Take 1 tablet (2 mg total) by mouth daily before breakfast. 03/13/19  Yes Boscia, Heather E, NP  glucose blood (TRUE METRIX BLOOD GLUCOSE TEST) test strip Use as instructed twice daily diag E11.65 11/19/18  Yes Boscia, Heather E, NP  hydrALAZINE (APRESOLINE) 100 MG tablet Take 1 tablet (100 mg total) by mouth 3 (three) times daily. 03/31/19  Yes Gouru, Aruna, MD  Insulin Glargine, 1 Unit Dial, (TOUJEO SOLOSTAR) 300 UNIT/ML SOPN Inject 26 Units into the skin every evening. 03/13/19 06/11/19 Yes Boscia, Heather E, NP  ipratropium-albuterol (DUONEB) 0.5-2.5 (3) MG/3ML SOLN Take 3 mLs by nebulization every 6 (six) hours as needed. 03/08/19  Yes Scarboro, Audie Clear, NP  isosorbide mononitrate (IMDUR) 30 MG 24 hr tablet Take 1  tablet (30 mg total) by mouth 2 (two) times daily. 04/13/19  Yes Sudini, Alveta Heimlich, MD  loratadine (CLARITIN) 10 MG tablet Take 1 tablet (10 mg total) by mouth daily. 04/01/19  Yes Gouru, Illene Silver, MD  metoprolol succinate (TOPROL-XL) 50 MG 24 hr tablet Take 1 tablet (50 mg total) by mouth at bedtime. Take with or immediately following a meal. 03/12/19  Yes Gollan, Kathlene November, MD  nicotine (NICODERM CQ - DOSED IN MG/24 HOURS) 21 mg/24hr patch Place 1 patch (21 mg total) onto the skin daily. 12/31/18  Yes [name], NP  NOVOFINE 32G X 6 MM MISC To use with [place] injections daily 12/13/18  Yes Boscia, Heather E, NP  omeprazole (PRILOSEC) 40 MG capsule Take 1 capsule (40 mg total) by mouth daily. 03/01/19  Yes Scarboro, Audie Clear, NP  predniSONE (DELTASONE) 20 MG tablet Take 1 tablet (20 mg total) by mouth daily. 04/29/19  Yes Caren Griffins, MD  tiotropium (SPIRIVA HANDIHALER) 18 MCG inhalation capsule Place 1 capsule (18 mcg total) into inhaler and inhale daily. 04/13/19 05/13/19 Yes  Hillary Bow, MD  triamcinolone ointment (KENALOG) 0.1 % Apply 1 application topically 2 (two) times daily. 11/23/18  Yes [name], NP  TRUEPLUS LANCETS 33G MISC Use as directed twice daily diag E11.65 04/04/18  Yes [name], NP  ferrous gluconate (FERGON) 324 MG tablet Take 1 tablet (324 mg total) by mouth daily with breakfast. Patient not taking: Reported on 04/24/2019 12/31/18   [name], NP    Physical Exam: Vitals:   05/09/19 1430 05/09/19 1433 05/09/19 1500 05/09/19 1530  BP: (!) 169/87 (!) 169/87 (!) 164/79 (!) 167/75  Pulse: 66 77 64 65  Resp:  18 16 18   Temp:      TempSrc:      SpO2: 98% 99% 98% 96%  Weight:      Height:          Constitutional: NAD, calm, comfortable Eyes: PERRL, lids and conjunctivae normal ENMT: Mucous membranes are moist. Posterior pharynx clear of any exudate or lesions.Normal dentition.  Neck: normal, supple, no masses, no thyromegaly Respiratory: clear  to auscultation bilaterally, no wheezing, no crackles. Normal respiratory effort. No accessory muscle use.  Cardiovascular: Regular rate and rhythm, no murmurs / rubs / gallops. No extremity edema. 2+ pedal pulses. No carotid bruits.  Abdomen: no tenderness, no masses palpated. No hepatosplenomegaly. Bowel sounds positive.  Musculoskeletal: no clubbing / cyanosis. No joint deformity upper and lower extremities. Good ROM, no contractures. Normal muscle tone.  Skin: no rashes, lesions, ulcers. No induration Neurologic: CN 2-12 grossly intact. Sensation intact, DTR normal. Strength 5/5 in all 4.  Psychiatric: Normal judgment and insight. Alert and oriented x 3. Normal mood.     Labs on Admission: I have personally reviewed following labs and imaging studies  CBC: Recent Labs  Lab 05/09/19 0911  WBC 7.3  HGB 10.6*  HCT 32.2*  MCV 84.3  PLT 627   Basic Metabolic Panel: Recent Labs  Lab 05/09/19 0911  NA 140  K 3.7  CL 108  CO2 20*  GLUCOSE 174*  BUN 46*  CREATININE 2.25*  CALCIUM 9.1   GFR: Estimated Creatinine Clearance: 41.7 mL/min (A) (by C-G formula based on SCr of 2.25 mg/dL (H)). Liver Function Tests: Recent Labs  Lab 05/09/19 0911  AST 25  ALT 18  ALKPHOS 60  BILITOT 0.6  PROT 6.7  ALBUMIN 3.1*   Recent Labs  Lab 05/09/19 0911  LIPASE 40   No results for input(s): AMMONIA in the last 168 hours. Coagulation Profile: No results for input(s): INR, PROTIME in the last 168 hours. Cardiac Enzymes: No results for input(s): CKTOTAL, CKMB, CKMBINDEX, TROPONINI in the last 168 hours. BNP (last 3 results) No results for input(s): PROBNP in the last 8760 hours. HbA1C: No results for input(s): HGBA1C in the last 72 hours. CBG: No results for input(s): GLUCAP in the last 168 hours. Lipid Profile: No results for input(s): CHOL, HDL, LDLCALC, TRIG, CHOLHDL, LDLDIRECT in the last 72 hours. Thyroid Function Tests: No results for input(s): TSH, T4TOTAL, FREET4,  T3FREE, THYROIDAB in the last 72 hours. Anemia Panel: No results for input(s): VITAMINB12, FOLATE, FERRITIN, TIBC, IRON, RETICCTPCT in the last 72 hours. Urine analysis:    Component Value Date/Time   COLORURINE YELLOW (A) 04/23/2019 1852   APPEARANCEUR CLEAR (A) 04/23/2019 1852   APPEARANCEUR Cloudy (A) 11/23/2018 1103   LABSPEC 1.011 04/23/2019 1852   LABSPEC 1.005 03/06/2013 0929   PHURINE 5.0 04/23/2019 1852   GLUCOSEU NEGATIVE 04/23/2019 1852   GLUCOSEU Negative  03/06/2013 0929   HGBUR NEGATIVE 04/23/2019 [place] 04/23/2019 1852   BILIRUBINUR negative 02/20/2019 1404   BILIRUBINUR Negative 11/23/2018 1103   BILIRUBINUR Negative 03/06/2013 [place] 04/23/2019 1852   PROTEINUR 100 (A) 04/23/2019 1852   UROBILINOGEN 0.2 02/20/2019 1404   NITRITE NEGATIVE 04/23/2019 1852   LEUKOCYTESUR NEGATIVE 04/23/2019 1852   LEUKOCYTESUR Negative 03/06/2013 0929   Sepsis Labs: !!!!!!!!!!!!!!!!!!!!!!!!!!!!!!!!!!!!!!!!!!!! @LABRCNTIP (procalcitonin:4,lacticidven:4) )No results found for this or any previous visit (from the past 240 hour(s)).   Radiological Exams on Admission: Dg Chest 2 View  Result Date: 05/09/2019 CLINICAL DATA:  Spitting up blood with lower extremity edema and generalized chest and upper abdominal pain since this morning. EXAM: CHEST - 2 VIEW COMPARISON:  04/24/2019 FINDINGS: Lungs are adequately inflated and otherwise clear. Cardiomediastinal silhouette and remainder of the exam is unchanged. IMPRESSION: No active cardiopulmonary disease. Electronically Signed   By: Marin Olp M.D.   On: 05/09/2019 09:48   Ct Chest Wo Contrast  Result Date: 05/09/2019 CLINICAL DATA:  Nonproductive cough, hemoptysis, bilateral leg swelling EXAM: CT CHEST WITHOUT CONTRAST TECHNIQUE: Multidetector CT imaging of the chest was performed following the standard protocol without IV contrast. COMPARISON:  Chest radiograph dated 05/09/2019. CTA chest dated  02/20/2019. FINDINGS: Cardiovascular: The heart is normal in size. No pericardial effusion. No evidence of thoracic aortic aneurysm. Atherosclerotic calcifications of the aortic arch. Coronary atherosclerosis of the LAD. Mediastinum/Nodes: No suspicious mediastinal lymphadenopathy. Visualized thyroid is unremarkable. Lungs/Pleura: No suspicious pulmonary nodules. No focal consolidation. Mild centrilobular emphysematous changes in the bilateral upper lobes. No pleural effusion or pneumothorax. Upper Abdomen: Visualized upper abdomen is notable for bilateral renal cysts measuring up to 7.5 cm in the left upper kidney, incompletely visualized. Musculoskeletal: Degenerative changes of the visualized thoracolumbar spine. IMPRESSION: No evidence of acute cardiopulmonary disease. Aortic Atherosclerosis (ICD10-I70.0) and Emphysema (ICD10-J43.9). Electronically Signed   By: Julian Hy M.D.   On: 05/09/2019 11:50     All images have been reviewed by me personally.    Assessment/Plan Principal Problem:   Hemoptysis Active Problems:   Morbid (severe) obesity due to excess calories (HCC)   Type 2 diabetes mellitus with diabetic polyneuropathy ([place])   Mixed hyperlipidemia   Chronic obstructive pulmonary disease, unspecified (HCC)   Essential hypertension   CHF (congestive heart failure) (HCC)   AKI (acute kidney injury) ([place])    Recurrent hemoptysis -We will admit the patient for observation.  Had 2 episodes at home and 2 episodes in the ER.  Currently hemodynamically stable.  COVID-19-negative.  Suspect mild bleeding from superficial blood vessel. -CT of the chest without contrast negative.  CTA from last month does not show any evidence of mass or lesions.  Unable to get contrast due to AKI. -Clinically monitor, if necessary consult pulmonary for bronchoscopy -EGD 04/12/2019-negative  Acute kidney injury on CKD stage IIIb -Baseline creatinine 1.8.  Admission creatinine 2.2.  Gentle hydration,  monitor.  Avoid nephrotoxic drugs.  History of COPD, stable -As needed bronchodilators  Essential hypertension -Norvasc 10 mg daily, Lasix 80 mg daily, hydralazine 100 mg 3 times daily, isosorbide mononitrate 30 mg twice daily, Toprol-XL  CAD  -Recently seen by cardiology on 04/28/2019 y, Myoview 9/20 showed no ischemia.  Supposed to get outpatient work-up including colonoscopy before placing him on DAPT.  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  Lantus 26 units daily -Insulin sliding scale Accu-Chek.  Morbid obesity  with BMI greater than 35. -Recommend weight loss   DVT prophylaxis: SCDs Code Status: Full code Family Communication: None Disposition Plan: To be determined Consults called: None Admission status: Observation admission to monitor him for any further hemoptysis   Time Spent: 65 minutes.  >50% of the time was devoted to discussing the patients care, assessment, plan and disposition with other care givers along with counseling the patient about the risks and benefits of treatment.    Ankit Arsenio Loader MD [facility]  If 7PM-7AM, please contact night-coverage   05/09/2019, 4:21 PM

## 2019-05-09 NOTE — ED Triage Notes (Signed)
Pt in via EMS from home with c/o spitting up blood after going hunting last pm and bilateral feet swelling.

## 2019-05-09 NOTE — Progress Notes (Signed)
Received hand off report from ED nurse Elmo Putt. Nurse stated she did a rapid covid test using glucometer and received results in 15 minutes showing a negative test result. I noticed it wasn't on the results review. A reminder for covid tset was on my work list, so I messaged Dr. Reesa Chew regarding if patient needed another covid test, MD stated he has one in the results showing negative and didn't need another.

## 2019-05-09 NOTE — ED Notes (Signed)
Pt's rapid Covid-19 test NEGATIVE

## 2019-05-09 NOTE — Discharge Instructions (Addendum)
Please follow-up with your primary care doctor in the next several days for recheck/reevaluation.  Return to the emergency department for any worsening chest pain development of trouble breathing, or any other symptom personally concerning to yourself.

## 2019-05-09 NOTE — ED Provider Notes (Addendum)
[facility] Emergency Department Provider Note  Time seen: 11:28 AM  I have reviewed the triage vital signs and the nursing notes.   HISTORY  Chief Complaint Hemoptysis and Leg Swelling   HPI Larry Callahan is a 63 y.o. male with a past medical history of diabetes, gastric reflux, hypertension, hyperlipidemia, CKD, CHF, presents to the emergency department for various complaints.  According to the patient he states he has been experiencing chest pain/pressure, however he states this has been an ongoing issue for years.  He also states this morning he woke up with blood on his pillow and he thinks he either coughed up blood or spit out blood he is not sure.  He has not been coughing out any blood in the emergency department.  Patient also states for the last few weeks his legs have been swollen.  Denies any fever.  Denies any cough per patient.     Past Medical History:  Diagnosis Date  . Allergy   . Coronary artery disease   . Diabetes mellitus without complication ([place])   . Diastolic CHF ([place])   . GERD (gastroesophageal reflux disease)   . Hyperlipidemia   . Hypertension   . Renal insufficiency   . Sleep apnea     Patient Active Problem List   Diagnosis Date Noted  . Hemoptysis 04/09/2019  . Shortness of breath 03/26/2019  . Uncontrolled type 2 diabetes mellitus with insulin therapy ([place]) 03/12/2019  . Acute on chronic heart failure with preserved ejection fraction (HFpEF) ([place])   . Acute kidney injury superimposed on chronic kidney disease ([place])   . CHF (congestive heart failure) ([place]) 03/01/2019  . Diastolic dysfunction 94/17/4081  . Iron deficiency anemia 12/31/2018  . Atopic dermatitis 11/24/2018  . Screening for colon cancer 11/06/2017  . Uncontrolled type 2 diabetes mellitus with hyperglycemia ([place]) 11/06/2017  . Dermatitis, unspecified 06/09/2017  . Atherosclerotic heart disease of native coronary artery without angina pectoris  06/09/2017  . 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 Obstructive sleep apnea, adult 06/09/2017  . Morbid (severe) obesity due to excess calories ([place]) 06/09/2017  . Cigarette nicotine dependence with nicotine-induced disorder 06/09/2017  . Type 2 diabetes mellitus with diabetic polyneuropathy ([place]) 06/09/2017  . Allergic rhinitis due to pollen 06/09/2017  . Mixed hyperlipidemia 06/09/2017  . Chronic obstructive pulmonary disease, unspecified ([place]) 06/09/2017  . Dyspnea on exertion 06/09/2017  . Wheezing 06/09/2017  . Dysuria 06/09/2017  . Snoring 06/09/2017  . Essential hypertension 06/09/2017  . Personal history of other malignant neoplasm of kidney 06/09/2017  . Pain in right hip 06/09/2017  . Impacted cerumen, bilateral 06/09/2017  . Secondary hyperparathyroidism, not elsewhere classified ([place]) 06/09/2017  . Type 2 diabetes mellitus with hyperglycemia ([place]) 06/09/2017  . Tinea corporis 06/09/2017    Past Surgical History:  Procedure Laterality Date  . BACK SURGERY    . CHOLECYSTECTOMY    . COLONOSCOPY WITH PROPOFOL N/A 04/12/2019   Procedure: COLONOSCOPY WITH PROPOFOL;  Surgeon: [name], MD;  Location: [facility] ENDOSCOPY;  Service: Gastroenterology;  Laterality: N/A;  . ESOPHAGOGASTRODUODENOSCOPY N/A 04/12/2019   Procedure: ESOPHAGOGASTRODUODENOSCOPY (EGD);  Surgeon: [name], MD;  Location: [facility] ENDOSCOPY;  Service: Gastroenterology;  Laterality: N/A;  . left arm surgery    . nephrectomy Left   . PARATHYROIDECTOMY    . RIGHT HEART CATH N/A 03/29/2019   Procedure: RIGHT HEART CATH;  Surgeon: [name], MD;  Location: [place] CV LAB;  Service:  Cardiovascular;  Laterality: N/A;    Prior to Admission medications   Medication Sig Start Date End Date Taking? Authorizing Provider  acetaminophen (TYLENOL) 325 MG tablet Take 2 tablets (650 mg total) by mouth every 4 (four) hours as needed for headache or mild pain.  03/05/19   Gouru, Illene Silver, MD  albuterol (VENTOLIN HFA) 108 (90 Base) MCG/ACT inhaler Inhale 2 puffs into the lungs every 6 (six) hours as needed for wheezing or shortness of breath. 03/05/19   Gouru, Illene Silver, MD  amLODipine (NORVASC) 10 MG tablet Take 1 tablet (10 mg total) by mouth daily. 03/12/19   [name], MD  aspirin EC 81 MG tablet Take 81 mg by mouth daily.    [provider]  Blood Glucose Calibration (TRUE METRIX LEVEL 1) Low SOLN Use as directed 04/04/18   [name], NP  Blood Glucose Monitoring Suppl (TRUE METRIX AIR GLUCOSE METER) DEVI 1 Device by Does not apply route 2 (two) times daily. 04/13/18   [name], NP  clobetasol cream (TEMOVATE) 7.09 % Apply 1 application topically 2 (two) times daily. 12/31/18   [name], NP  ferrous gluconate (FERGON) 324 MG tablet Take 1 tablet (324 mg total) by mouth daily with breakfast. Patient not taking: Reported on 04/24/2019 12/31/18   [name], NP  ferrous sulfate 325 (65 FE) MG EC tablet Take 1 tablet (325 mg total) by mouth 2 (two) times daily with a meal. 04/13/19 05/13/19  Hillary Bow, MD  Fluticasone-Salmeterol (ADVAIR DISKUS) 250-50 MCG/DOSE AEPB Inhale 1 puff into the lungs 2 (two) times daily. 03/05/19 03/04/20  Nicholes Mango, MD  furosemide (LASIX) 80 MG tablet Take 1 tablet (80 mg total) by mouth daily. 04/13/19   Hillary Bow, MD  gemfibrozil (LOPID) 600 MG tablet Take 1 tablet (600 mg total) by mouth 2 (two) times daily with a meal. 02/28/19   [name], NP  glimepiride (AMARYL) 2 MG tablet Take 1 tablet (2 mg total) by mouth daily before breakfast. 03/13/19   [name], NP  glucose blood (TRUE METRIX BLOOD GLUCOSE TEST) test strip Use as instructed twice daily diag E11.65 11/19/18   [name], NP  hydrALAZINE (APRESOLINE) 100 MG tablet Take 1 tablet (100 mg total) by mouth 3 (three) times daily. 03/31/19   Gouru, Illene Silver, MD  Insulin Glargine, 1 Unit Dial, (TOUJEO SOLOSTAR)  300 UNIT/ML SOPN Inject 26 Units into the skin every evening. 03/13/19 06/11/19  [name], NP  ipratropium-albuterol (DUONEB) 0.5-2.5 (3) MG/3ML SOLN Take 3 mLs by nebulization every 6 (six) hours as needed. 03/08/19   Kendell Bane, NP  isosorbide mononitrate (IMDUR) 30 MG 24 hr tablet Take 1 tablet (30 mg total) by mouth 2 (two) times daily. 04/13/19   Hillary Bow, MD  loratadine (CLARITIN) 10 MG tablet Take 1 tablet (10 mg total) by mouth daily. 04/01/19   Nicholes Mango, MD  metoprolol succinate (TOPROL-XL) 50 MG 24 hr tablet Take 1 tablet (50 mg total) by mouth at bedtime. Take with or immediately following a meal. 03/12/19   Gollan, Kathlene November, MD  nicotine (NICODERM CQ - DOSED IN MG/24 HOURS) 21 mg/24hr patch Place 1 patch (21 mg total) onto the skin daily. 12/31/18   [name], NP  NOVOFINE 32G X 6 MM MISC To use with [place] injections daily 12/13/18   [name], NP  omeprazole (PRILOSEC) 40 MG capsule Take 1 capsule (40 mg total) by mouth daily. 03/01/19  Kendell Bane, NP  predniSONE (DELTASONE) 20 MG tablet Take 1 tablet (20 mg total) by mouth daily. 04/29/19   Caren Griffins, MD  tiotropium (SPIRIVA HANDIHALER) 18 MCG inhalation capsule Place 1 capsule (18 mcg total) into inhaler and inhale daily. 04/13/19 05/13/19  Hillary Bow, MD  triamcinolone ointment (KENALOG) 0.1 % Apply 1 application topically 2 (two) times daily. 11/23/18   [name], NP  TRUEPLUS LANCETS 33G MISC Use as directed twice daily diag E11.65 04/04/18   [name], NP    Allergies  Allergen Reactions  . Penicillins Rash    Family History  Problem Relation Age of Onset  . Diabetes Mother   . Lung cancer Father   . Diabetes Sister   . Hypertension Sister   . Heart disease Sister   . Cancer Sister   . Bone cancer Brother     Social History Social History   Tobacco Use  . Smoking status: Former Research scientist (life sciences)  . Smokeless tobacco: Former Systems developer    Types: Chew  Substance  Use Topics  . Alcohol use: No    Frequency: Never  . Drug use: No    Review of Systems Constitutional: Negative for fever. Eyes: Negative for visual complaints ENT: Negative for recent illness/congestion Cardiovascular: Negative for chest pain. Respiratory: Negative for shortness of breath. Gastrointestinal: Negative for abdominal pain, vomiting and diarrhea. Genitourinary: Negative for urinary compaints Musculoskeletal: Negative for musculoskeletal complaints Skin: Negative for skin complaints  Neurological: Negative for headache All other ROS negative  ____________________________________________   PHYSICAL EXAM:  VITAL SIGNS: ED Triage Vitals [05/09/19 0908]  Enc Vitals Group     BP (!) 143/66     Pulse Rate 72     Resp 16     Temp 98.2 F (36.8 C)     Temp Source Oral     SpO2 97 %     Weight 262 lb 5.6 oz (119 kg)     Height 5\' 9"  (1.753 m)     Head Circumference      Peak Flow      Pain Score 0     Pain Loc      Pain Edu?      Excl. in [place]?     Constitutional: Alert and oriented. Well appearing and in no distress. Eyes: Normal exam ENT      Head: Normocephalic and atraumatic.      Nose: No dried blood. Mouth/Throat: No bleeding in the mouth or lesions identified. Cardiovascular: Normal rate, regular rhythm.  Respiratory: Normal respiratory effort without tachypnea nor retractions. Breath sounds are clear  Gastrointestinal: Soft and nontender. No distention.   Musculoskeletal: Nontender with normal range of motion in all extremities.  Mild lower extreme edema equal bilaterally. Neurologic:  Normal speech and language. No gross focal neurologic deficits Skin:  Skin is warm, dry and intact.  Psychiatric: Mood and affect are normal.   ____________________________________________    EKG  EKG viewed and interpreted by myself shows normal sinus rhythm at 67 bpm with a narrow QRS, normal axis, normal intervals, no concerning ST  changes.  ____________________________________________    RADIOLOGY  Chest x-ray is negative.  CT scan is negative for acute abnormality.  ____________________________________________   INITIAL IMPRESSION / ASSESSMENT AND PLAN / ED COURSE  Pertinent labs & imaging results that were available during my care of the patient were reviewed by me and considered in my medical decision making (see chart for details).   Patient presents  to the emergency department for multiple complaints including intermittent chest pain times years, leg swelling again times months to years, possible hemoptysis today states there is some blood on his pillow but denies any coughing recently.  No blood noted in the patient's nasal oropharynx.  Patient's lab work is largely at baseline including chronic kidney disease.  Troponin is negative and largely unchanged from prior levels.  Overall the patient appears very well.  No hemoptysis in the emergency department.  Denies any nausea or vomiting at any point.  We obtain a CT scan of the chest without contrast as a precaution.  If CT is negative for acute abnormality I would anticipate discharge home with PCP follow-up.   CT scan is essentially negative however when I went to reevaluate the patient he has coughed up fairly large amount of blood into a tissue.  Given his active hemoptysis we will admit to the hospital service for continued monitoring.  We will dose Solu-Medrol.  Patient does not appear to be on any anticoagulation besides 81 mg aspirin daily.  Was recently discharged from the hospital Lev[facility]/17/20 and states he was receiving Lovenox injections during his hospital stay.  Cristin Szatkowski Ono was evaluated in Emergency Department on 05/09/2019 for the symptoms described in the history of present illness. He was evaluated in the context of the global COVID-19 pandemic, which necessitated consideration that the patient might be at risk for infection with the  SARS-CoV-2 virus that causes COVID-19. Institutional protocols and algorithms that pertain to the evaluation of patients at risk for COVID-19 are in a state of rapid change based on information released by regulatory bodies including the CDC and federal and state organizations. These policies and algorithms were followed during the patient's care in the ED.  ____________________________________________   FINAL CLINICAL IMPRESSION(S) / ED DIAGNOSES  Hemoptysis Chest pain   Harvest Dark, MD 05/09/19 [place]    Harvest Dark, MD 05/09/19 1339

## 2019-05-10 DIAGNOSIS — I5032 Chronic diastolic (congestive) heart failure: Secondary | ICD-10-CM | POA: Diagnosis present

## 2019-05-10 DIAGNOSIS — Z794 Long term (current) use of insulin: Secondary | ICD-10-CM | POA: Diagnosis not present

## 2019-05-10 DIAGNOSIS — N179 Acute kidney failure, unspecified: Secondary | ICD-10-CM

## 2019-05-10 DIAGNOSIS — Z808 Family history of malignant neoplasm of other organs or systems: Secondary | ICD-10-CM | POA: Diagnosis not present

## 2019-05-10 DIAGNOSIS — J449 Chronic obstructive pulmonary disease, unspecified: Secondary | ICD-10-CM | POA: Diagnosis not present

## 2019-05-10 DIAGNOSIS — E1142 Type 2 diabetes mellitus with diabetic polyneuropathy: Secondary | ICD-10-CM | POA: Diagnosis present

## 2019-05-10 DIAGNOSIS — Z7952 Long term (current) use of systemic steroids: Secondary | ICD-10-CM | POA: Diagnosis not present

## 2019-05-10 DIAGNOSIS — Z7982 Long term (current) use of aspirin: Secondary | ICD-10-CM | POA: Diagnosis not present

## 2019-05-10 DIAGNOSIS — Z8249 Family history of ischemic heart disease and other diseases of the circulatory system: Secondary | ICD-10-CM | POA: Diagnosis not present

## 2019-05-10 DIAGNOSIS — Z20828 Contact with and (suspected) exposure to other viral communicable diseases: Secondary | ICD-10-CM | POA: Diagnosis present

## 2019-05-10 DIAGNOSIS — N1832 Chronic kidney disease, stage 3b: Secondary | ICD-10-CM | POA: Diagnosis present

## 2019-05-10 DIAGNOSIS — K2100 Gastro-esophageal reflux disease with esophagitis, without bleeding: Secondary | ICD-10-CM | POA: Diagnosis present

## 2019-05-10 DIAGNOSIS — Z905 Acquired absence of kidney: Secondary | ICD-10-CM | POA: Diagnosis not present

## 2019-05-10 DIAGNOSIS — E782 Mixed hyperlipidemia: Secondary | ICD-10-CM | POA: Diagnosis present

## 2019-05-10 DIAGNOSIS — Z801 Family history of malignant neoplasm of trachea, bronchus and lung: Secondary | ICD-10-CM | POA: Diagnosis not present

## 2019-05-10 DIAGNOSIS — I2510 Atherosclerotic heart disease of native coronary artery without angina pectoris: Secondary | ICD-10-CM | POA: Diagnosis present

## 2019-05-10 DIAGNOSIS — I10 Essential (primary) hypertension: Secondary | ICD-10-CM

## 2019-05-10 DIAGNOSIS — R042 Hemoptysis: Secondary | ICD-10-CM | POA: Diagnosis present

## 2019-05-10 DIAGNOSIS — Z880 Allergy status to penicillin: Secondary | ICD-10-CM | POA: Diagnosis not present

## 2019-05-10 DIAGNOSIS — D509 Iron deficiency anemia, unspecified: Secondary | ICD-10-CM | POA: Diagnosis present

## 2019-05-10 DIAGNOSIS — Z87891 Personal history of nicotine dependence: Secondary | ICD-10-CM | POA: Diagnosis not present

## 2019-05-10 DIAGNOSIS — I130 Hypertensive heart and chronic kidney disease with heart failure and stage 1 through stage 4 chronic kidney disease, or unspecified chronic kidney disease: Secondary | ICD-10-CM | POA: Diagnosis present

## 2019-05-10 DIAGNOSIS — Z833 Family history of diabetes mellitus: Secondary | ICD-10-CM | POA: Diagnosis not present

## 2019-05-10 DIAGNOSIS — Z7951 Long term (current) use of inhaled steroids: Secondary | ICD-10-CM | POA: Diagnosis not present

## 2019-05-10 DIAGNOSIS — Z6839 Body mass index (BMI) 39.0-39.9, adult: Secondary | ICD-10-CM | POA: Diagnosis not present

## 2019-05-10 LAB — COMPREHENSIVE METABOLIC PANEL
ALT: 17 U/L (ref 0–44)
AST: 19 U/L (ref 15–41)
Albumin: 2.8 g/dL — ABNORMAL LOW (ref 3.5–5.0)
Alkaline Phosphatase: 53 U/L (ref 38–126)
Anion gap: 12 (ref 5–15)
BUN: 49 mg/dL — ABNORMAL HIGH (ref 8–23)
CO2: 20 mmol/L — ABNORMAL LOW (ref 22–32)
Calcium: 9 mg/dL (ref 8.9–10.3)
Chloride: 105 mmol/L (ref 98–111)
Creatinine, Ser: 2.15 mg/dL — ABNORMAL HIGH (ref 0.61–1.24)
GFR calc Af Amer: 36 mL/min — ABNORMAL LOW (ref >60)
GFR calc non Af Amer: 31 mL/min — ABNORMAL LOW (ref >60)
Glucose, Bld: 218 mg/dL — ABNORMAL HIGH (ref 70–99)
Potassium: 3.6 mmol/L (ref 3.5–5.1)
Sodium: 137 mmol/L (ref 135–145)
Total Bilirubin: 0.5 mg/dL (ref 0.3–1.2)
Total Protein: 6.2 g/dL — ABNORMAL LOW (ref 6.5–8.1)

## 2019-05-10 LAB — GLUCOSE, CAPILLARY
Glucose-Capillary: 131 mg/dL — ABNORMAL HIGH (ref 70–99)
Glucose-Capillary: 166 mg/dL — ABNORMAL HIGH (ref 70–99)
Glucose-Capillary: 225 mg/dL — ABNORMAL HIGH (ref 70–99)
Glucose-Capillary: 231 mg/dL — ABNORMAL HIGH (ref 70–99)

## 2019-05-10 LAB — FIBRIN DERIVATIVES D-DIMER (ARMC ONLY): Fibrin derivatives D-dimer (ARMC): 627.45 ng/mL (FEU) — ABNORMAL HIGH (ref 0.00–499.00)

## 2019-05-10 LAB — CBC
HCT: 32.7 % — ABNORMAL LOW (ref 39.0–52.0)
Hemoglobin: 10.4 g/dL — ABNORMAL LOW (ref 13.0–17.0)
MCH: 27.3 pg (ref 26.0–34.0)
MCHC: 31.8 g/dL (ref 30.0–36.0)
MCV: 85.8 fL (ref 80.0–100.0)
Platelets: 242 10*3/uL (ref 150–400)
RBC: 3.81 MIL/uL — ABNORMAL LOW (ref 4.22–5.81)
RDW: 16.9 % — ABNORMAL HIGH (ref 11.5–15.5)
WBC: 8.3 10*3/uL (ref 4.0–10.5)
nRBC: 0 % (ref 0.0–0.2)

## 2019-05-10 LAB — BRAIN NATRIURETIC PEPTIDE: B Natriuretic Peptide: 75 pg/mL (ref 0.0–100.0)

## 2019-05-10 LAB — PROTIME-INR
INR: 1 (ref 0.8–1.2)
Prothrombin Time: 12.9 seconds (ref 11.4–15.2)

## 2019-05-10 LAB — MAGNESIUM: Magnesium: 2 mg/dL (ref 1.7–2.4)

## 2019-05-10 LAB — APTT: aPTT: 31 seconds (ref 24–36)

## 2019-05-10 MED ORDER — CLARITHROMYCIN 500 MG PO TABS
500.0000 mg | ORAL_TABLET | Freq: Two times a day (BID) | ORAL | Status: DC
  Administered 2019-05-10 – 2019-05-12 (×5): 500 mg via ORAL
  Filled 2019-05-10 (×7): qty 1

## 2019-05-10 MED ORDER — IPRATROPIUM-ALBUTEROL 0.5-2.5 (3) MG/3ML IN SOLN
3.0000 mL | Freq: Three times a day (TID) | RESPIRATORY_TRACT | Status: DC
  Administered 2019-05-10 – 2019-05-13 (×10): 3 mL via RESPIRATORY_TRACT
  Filled 2019-05-10 (×11): qty 3

## 2019-05-10 MED ORDER — IPRATROPIUM-ALBUTEROL 0.5-2.5 (3) MG/3ML IN SOLN: 3.0000 mL | Freq: Four times a day (QID) | RESPIRATORY_TRACT | Status: DC | PRN

## 2019-05-10 NOTE — Progress Notes (Signed)
PROGRESS NOTE    Larry Callahan  PPJ:[id number] DOB: [birth year] DOA: 05/09/2019 PCP: [name], NP   Brief Narrative:  Per admitting physician: Larry Callahan is a 63 y.o. male with medical history significant of CAD, COPD, essential hypertension, obesity, iron deficiency anemia presented to the hospital with complaints of hemoptysis.  Patient states over the course the last several days he has had few episodes of hemoptysis/blood in his spit but denies much of coughing, fevers chills or other complaints.  But this morning he had couple of episodes which were little bit more than usual therefore came to the hospital. In the ER he had couple of small episodes in a tissue as well.  CT of the chest without contrast was negative.  Was in mild AKI therefore did not receive contrast.  Baseline creatinine 1.8, admission creatinine 2.2.  He was admitted for overnight observation.  Admitted here about 2 weeks ago for shortness of breath secondary to pulmonary edema requiring diuresis.  Cardiology had seen the patient at that time.  Had endoscopy and colonoscopy about a month ago which were both negative for any acute pathology.  Overnight patient had reported initial hemoptysist had cleared but now is having recurrent bright red blood hemoptysis witnessed by staff this afternoon-pulm consult pending   Assessment & Plan:   Principal Problem:   Hemoptysis Active Problems:   Morbid (severe) obesity due to excess calories (HCC)   Type 2 diabetes mellitus with diabetic polyneuropathy ([place])   Mixed hyperlipidemia   Chronic obstructive pulmonary disease, unspecified (HCC)   Essential hypertension   CHF (congestive heart failure) ([place])   AKI (acute kidney injury) ([place])   Recurrent hemoptysis -Patient an additional episode of hemoptysis this afternoon of bright red blood witnessed by staff -Pulmonary consultation pending, tried reaching patient's primary pulmonologist  without answer, in-house pulmonologist has kindly agreed to see the patient in consultation -Hemoglobin 10.4, 10.6 on admission -As previously noted CT of chest without contrast was negative for acute findings -EGD April 12, 2019 was negative  Acute kidney injury on CKD stage IIIb -Baseline creatinine 1.8.  Admission creatinine 2.2.   -Continue with gentle hydration, monitor.   - Avoid nephrotoxic drugs.  History of COPD, stable -As needed bronchodilators  Essential hypertension -Norvasc 10 mg daily,  -Lasix 80 mg daily,  -hydralazine 100 mg 3 times daily,  -isosorbide mononitrate 30 mg twice daily,  -Toprol-XL 50 mg daily  CAD  -Recently seen by cardiology on 04/28/2019 ,  -Myoview 9/20 showed no ischemia.  -Supposed to get outpatient work-up including colonoscopy before placing him on DAPT.  Diabetes mellitus type 2 Peripheral neuropathy secondary to diabetes mellitus type 2 -Continue to hold home oral medication.   - Lantus 26 units daily -Insulin sliding scale Accu-Chek.  Morbid obesity with BMI greater than 35. -Recommend weight loss  DVT prophylaxis: SCD/Compression stockings  Code Status: full code    Code Status Orders  (From admission, onward)         Start     Ordered   05/09/19 1352  Full code  Continuous     05/09/19 1352        Code Status History    Date Active Date Inactive Code Status Order ID Comments User Context   04/24/2019 2038 04/30/2019 1406 Full Code [id number]  Caren Griffins, MD Inpatient   04/10/2019 1958 04/13/2019 1718 Full Code [id number]  Loletha Grayer, MD ED   03/26/2019 1532 03/31/2019 1723 Full Code [id number]  Loletha Grayer, MD ED   03/01/2019 1536 03/05/2019 2055 Full Code [id number]  Loletha Grayer, MD ED   Advance Care Planning Activity     Family Communication: Spoke with sister answered all questions Disposition Plan:   Patient remained inpatient had additional hemoptysis of bright red blood.  Awaiting  expert subspecialty consultation possible bronchoscopy.  Will need to monitor patient's hemoglobin inpatient till he stabilizes.  Patient is not yet stable for discharge or safe discharge Consults called: Pulmonology Admission status: Inpatient   Consultants:   Pulmonology  Procedures:  Dg Chest 2 View  Result Date: 05/09/2019 CLINICAL DATA:  Spitting up blood with lower extremity edema and generalized chest and upper abdominal pain since this morning. EXAM: CHEST - 2 VIEW COMPARISON:  04/24/2019 FINDINGS: Lungs are adequately inflated and otherwise clear. Cardiomediastinal silhouette and remainder of the exam is unchanged. IMPRESSION: No active cardiopulmonary disease. Electronically Signed   By: Marin Olp M.D.   On: 05/09/2019 09:48   Dg Chest 2 View  Result Date: 04/24/2019 CLINICAL DATA:  complaints of shortness of breath, fatigue since this morning. Pt here for same recently, admission due to CHF. HTN, CHF, DM, former smoker EXAM: CHEST - 2 VIEW COMPARISON:  04/10/2019 FINDINGS: Mild central pulmonary vascular congestion and mild diffuse interstitial prominent, increased since previous. Heart size upper limits normal. Aortic Atherosclerosis (ICD10-170.0). No effusion. Anterior vertebral endplate spurring at multiple levels in the lower thoracic spine. IMPRESSION: Worsening central pulmonary vascular congestion and interstitial edema. Electronically Signed   By: Lucrezia Europe M.D.   On: 04/24/2019 12:25   Dg Chest 2 View  Result Date: 04/10/2019 CLINICAL DATA:  Shortness of breath EXAM: CHEST - 2 VIEW COMPARISON:  Radiograph 04/09/2019, CT 02/20/2019 FINDINGS: Hazy interstitial opacities with cephalized, indistinct vascularity. Cardiomediastinal contours are stable prior. No pneumothorax or effusion. No acute osseous or soft tissue abnormality. IMPRESSION: Appearance compatible with CHF with mild cardiomegaly and features of interstitial edema. Electronically Signed   By: Lovena Le  M.D.   On: 04/10/2019 17:40   Ct Head Wo Contrast  Result Date: 04/29/2019 CLINICAL DATA:  Syncopal episode today with trauma to the right side of the head EXAM: CT HEAD WITHOUT CONTRAST TECHNIQUE: Contiguous axial images were obtained from the base of the skull through the vertex without intravenous contrast. COMPARISON:  03/26/2019 FINDINGS: Brain: The brain shows a normal appearance without evidence of malformation, atrophy, old or acute small or large vessel infarction, mass lesion, hemorrhage, hydrocephalus or extra-axial collection. Vascular: No hyperdense vessel. No evidence of atherosclerotic calcification. Skull: Normal.  No traumatic finding.  No focal bone lesion. Sinuses/Orbits: Sinuses are clear. Orbits appear normal. Mastoids are clear. Other: None significant IMPRESSION: Normal head CT Electronically Signed   By: Nelson Chimes M.D.   On: 04/29/2019 16:59   Ct Chest Wo Contrast  Result Date: 05/09/2019 CLINICAL DATA:  Nonproductive cough, hemoptysis, bilateral leg swelling EXAM: CT CHEST WITHOUT CONTRAST TECHNIQUE: Multidetector CT imaging of the chest was performed following the standard protocol without IV contrast. COMPARISON:  Chest radiograph dated 05/09/2019. CTA chest dated 02/20/2019. FINDINGS: Cardiovascular: The heart is normal in size. No pericardial effusion. No evidence of thoracic aortic aneurysm. Atherosclerotic calcifications of the aortic arch. Coronary atherosclerosis of the LAD. Mediastinum/Nodes: No suspicious mediastinal lymphadenopathy. Visualized thyroid is unremarkable. Lungs/Pleura: No suspicious pulmonary nodules. No focal consolidation. Mild centrilobular emphysematous changes in the bilateral upper lobes. No pleural effusion or pneumothorax. Upper Abdomen: Visualized upper abdomen is notable for bilateral renal cysts measuring  up to 7.5 cm in the left upper kidney, incompletely visualized. Musculoskeletal: Degenerative changes of the visualized thoracolumbar spine.  IMPRESSION: No evidence of acute cardiopulmonary disease. Aortic Atherosclerosis (ICD10-I70.0) and Emphysema (ICD10-J43.9). Electronically Signed   By: Julian Hy M.D.   On: 05/09/2019 11:50   Ct Renal Stone Study  Result Date: 04/24/2019 CLINICAL DATA:  Left flank pain. Generalized abdominal pain. Bloody stools. EXAM: CT ABDOMEN AND PELVIS WITHOUT CONTRAST TECHNIQUE: Multidetector CT imaging of the abdomen and pelvis was performed following the standard protocol without IV contrast. COMPARISON:  01/31/2014 CT abdomen/pelvis. FINDINGS: Lower chest: No significant pulmonary nodules or acute consolidative airspace disease. Hepatobiliary: Normal liver size. No liver mass. Cholecystectomy. No biliary ductal dilatation. Pancreas: Normal, with no mass or duct dilation. Spleen: Normal size spleen. Granulomatous calcifications throughout the spleen. No splenic mass. Adrenals/Urinary Tract: Normal right adrenal. 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 Left nephrectomy with no mass or fluid collection in the left nephrectomy bed. No right hydronephrosis. No right renal stones. Multiple simple right renal cysts, largest 7.7 cm in the posterior lower right kidney. Normal caliber right ureter. No right ureteral stones. Normal bladder with no bladder stones. Stomach/Bowel: Normal non-distended stomach. Normal caliber small bowel with no small bowel wall thickening. Normal appendix. Marked left colonic diverticulosis, with no large bowel wall thickening or significant pericolonic fat stranding. Vascular/Lymphatic: Atherosclerotic nonaneurysmal abdominal aorta. No pathologically enlarged lymph nodes in the abdomen or pelvis. Reproductive: Mildly enlarged prostate. Other: No pneumoperitoneum, ascites or focal fluid collection. Moderate to large fat containing umbilical hernia, slightly increased from prior.  Chronic diastasis of posterior left lateral abdominal wall. Musculoskeletal: No aggressive appearing focal osseous lesions. Moderate thoracolumbar spondylosis. IMPRESSION: 1. No acute abnormality. No evidence of bowel obstruction or acute bowel inflammation. Marked sigmoid diverticulosis, with no evidence of acute diverticulitis. 2. No right hydronephrosis.  No urolithiasis. 3. 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 Given the large size and continued growth, surgical consultation may be considered. 4. Left nephrectomy. 5. Mild prostatomegaly. 6. Moderate to large fat containing umbilical hernia, slightly increased. 7.  Aortic Atherosclerosis (ICD10-I70.0). Electronically Signed   By: Ilona Sorrel M.D.   On: 04/24/2019 16:17   Dg Hip Unilat With Pelvis 2-3 Views Left  Result Date: 04/29/2019 CLINICAL DATA:  Fall EXAM: DG HIP (WITH OR WITHOUT PELVIS) 2-3V LEFT COMPARISON:  None. FINDINGS: Anatomic alignment is maintained. There is no acute fracture. Mild changes of osteoarthritis. IMPRESSION: No acute fracture. Electronically Signed   By: Macy Mis M.D.   On: 04/29/2019 12:06   Dg Hip Unilat With Pelvis 2-3 Views Right  Result Date: 04/29/2019 CLINICAL DATA:  Fall EXAM: DG HIP (WITH OR WITHOUT PELVIS) 2-3V RIGHT COMPARISON:  2011 FINDINGS: Alignment is anatomic. There is no acute fracture. Changes of osteoarthritis are present similar to 2011. IMPRESSION: No acute fracture at the right hip. Electronically Signed   By: Macy Mis M.D.   On: 04/29/2019 12:03     Antimicrobials:   None   Subjective: Patient admitted overnight for hemoptysis Additional episode of hemoptysis this afternoon  Objective: Vitals:   05/09/19 1946 05/09/19 2100 05/10/19 0512 05/10/19 0900  BP:  140/60 132/85 129/70  Pulse:  (!) 47 82   Resp:  20 20   Temp:  (!) 97.5  F (36.4 C) 97.9 F (36.6 C)   TempSrc:  Oral Oral     SpO2: 96% 96% 94%   Weight:  118.6 kg    Height:        Intake/Output Summary (Last 24 hours) at 05/10/2019 1324 Last data filed at 05/10/2019 0900 Gross per 24 hour  Intake 1376.71 ml  Output 1150 ml  Net 226.71 ml   Filed Weights   05/09/19 0908 05/09/19 2100  Weight: 119 kg 118.6 kg    Examination:  General exam: Appears calm and comfortable hard of hearing Respiratory system: Clear to auscultation. Respiratory effort normal. Cardiovascular system: S1 & S2 heard, RRR. No JVD, murmurs, rubs, gallops or clicks. No pedal edema. Gastrointestinal system: Abdomen is nondistended, soft and nontender. No organomegaly or masses felt. Normal bowel sounds heard. Central nervous system: Alert and oriented. No focal neurological deficits. Extremities: Warm well perfused, no contractures no edema neurovascularly intact. Skin: No rashes, lesions or ulcers Psychiatry: Judgement and insight are to be mildly impaired secondary to probable chronic cognitive deficit. Mood & affect appropriate.     Data Reviewed: I have personally reviewed following labs and imaging studies  CBC: Recent Labs  Lab 05/09/19 0911 05/10/19 0457  WBC 7.3 8.3  HGB 10.6* 10.4*  HCT 32.2* 32.7*  MCV 84.3 85.8  PLT 222 858   Basic Metabolic Panel: Recent Labs  Lab 05/09/19 0911 05/10/19 0457  NA 140 137  K 3.7 3.6  CL 108 105  CO2 20* 20*  GLUCOSE 174* 218*  BUN 46* 49*  CREATININE 2.25* 2.15*  CALCIUM 9.1 9.0  MG  --  2.0   GFR: Estimated Creatinine Clearance: 43.6 mL/min (A) (by C-G formula based on SCr of 2.15 mg/dL (H)). Liver Function Tests: Recent Labs  Lab 05/09/19 0911 05/10/19 0457  AST 25 19  ALT 18 17  ALKPHOS 60 53  BILITOT 0.6 0.5  PROT 6.7 6.2*  ALBUMIN 3.1* 2.8*   Recent Labs  Lab 05/09/19 0911  LIPASE 40   No results for input(s): AMMONIA in the last 168 hours. Coagulation Profile: No results for input(s): INR, PROTIME in the last 168 hours. Cardiac Enzymes: No  results for input(s): CKTOTAL, CKMB, CKMBINDEX, TROPONINI in the last 168 hours. BNP (last 3 results) No results for input(s): PROBNP in the last 8760 hours. HbA1C: Recent Labs    05/09/19 1351  HGBA1C 6.8*   CBG: Recent Labs  Lab 05/09/19 1707 05/09/19 2100 05/09/19 2254 05/10/19 0754 05/10/19 1146  GLUCAP 174* 324* 303* 131* 166*   Lipid Profile: No results for input(s): CHOL, HDL, LDLCALC, TRIG, CHOLHDL, LDLDIRECT in the last 72 hours. Thyroid Function Tests: No results for input(s): TSH, T4TOTAL, FREET4, T3FREE, THYROIDAB in the last 72 hours. Anemia Panel: No results for input(s): VITAMINB12, FOLATE, FERRITIN, TIBC, IRON, RETICCTPCT in the last 72 hours. Sepsis Labs: No results for input(s): PROCALCITON, LATICACIDVEN in the last 168 hours.  No results found for this or any previous visit (from the past 240 hour(s)).       Radiology Studies: Dg Chest 2 View  Result Date: 05/09/2019 CLINICAL DATA:  Spitting up blood with lower extremity edema and generalized chest and upper abdominal pain since this morning. EXAM: CHEST - 2 VIEW COMPARISON:  04/24/2019 FINDINGS: Lungs are adequately inflated and otherwise clear. Cardiomediastinal silhouette and remainder of the exam is unchanged. IMPRESSION: No active cardiopulmonary disease. Electronically Signed   By: Marin Olp M.D.   On: 05/09/2019 09:48   Ct  Chest Wo Contrast  Result Date: 05/09/2019 CLINICAL DATA:  Nonproductive cough, hemoptysis, bilateral leg swelling EXAM: CT CHEST WITHOUT CONTRAST TECHNIQUE: Multidetector CT imaging of the chest was performed following the standard protocol without IV contrast. COMPARISON:  Chest radiograph dated 05/09/2019. CTA chest dated 02/20/2019. FINDINGS: Cardiovascular: The heart is normal in size. No pericardial effusion. No evidence of thoracic aortic aneurysm. Atherosclerotic calcifications of the aortic arch. Coronary atherosclerosis of the LAD. Mediastinum/Nodes: No suspicious  mediastinal lymphadenopathy. Visualized thyroid is unremarkable. Lungs/Pleura: No suspicious pulmonary nodules. No focal consolidation. Mild centrilobular emphysematous changes in the bilateral upper lobes. No pleural effusion or pneumothorax. Upper Abdomen: Visualized upper abdomen is notable for bilateral renal cysts measuring up to 7.5 cm in the left upper kidney, incompletely visualized. Musculoskeletal: Degenerative changes of the visualized thoracolumbar spine. IMPRESSION: No evidence of acute cardiopulmonary disease. Aortic Atherosclerosis (ICD10-I70.0) and Emphysema (ICD10-J43.9). Electronically Signed   By: Julian Hy M.D.   On: 05/09/2019 11:50        Scheduled Meds:  amLODipine  10 mg Oral Daily   ferrous sulfate  325 mg Oral BID WC   furosemide  80 mg Oral Daily   gemfibrozil  600 mg Oral BID WC   hydrALAZINE  100 mg Oral TID   insulin aspart  0-15 Units Subcutaneous TID WC   insulin aspart  0-5 Units Subcutaneous QHS   insulin aspart  2 Units Subcutaneous TID WC   insulin glargine  26 Units Subcutaneous QPM   ipratropium-albuterol  3 mL Nebulization TID   isosorbide mononitrate  30 mg Oral BID   metoprolol succinate  50 mg Oral QHS   mometasone-formoterol  2 puff Inhalation BID   pantoprazole (PROTONIX) IV  40 mg Intravenous Q12H   predniSONE  20 mg Oral Daily   Continuous Infusions:  sodium chloride 75 mL/hr at 05/10/19 0800     LOS: 0 days    Time spent: 35 min    Nicolette Bang, MD [facility]  If 7PM-7AM, please contact night-coverage  05/10/2019, 1:24 PM

## 2019-05-10 NOTE — TOC Initial Note (Signed)
Transition of Care [facility]) - Initial/Assessment Note    Patient Details  Name: Larry Callahan MRN: [id number] Date of Birth: December 20, 1955  [facility] CM/SW Contact:    Beverly Sessions, RN Phone Number: 05/10/2019, 7:53 PM  Clinical Narrative:                 Patient admitted from home alone with hemoptysis  Patient states that he is independent at baseline  PCP [facility].  Denies issues obtaining medications  Patient states that his sister lives locally and is available for transportation if needed  [place] [facility] in the past.  Per MD there should be no indication for home health at discharge.     Expected Discharge Plan: Home/Self Care Barriers to Discharge: Continued Medical Work up   Patient Goals and CMS Choice Patient states their goals for this hospitalization and ongoing recovery are:: To go home tomorrow      Expected Discharge Plan and Services Expected Discharge Plan: Home/Self Care   Discharge Planning Services: CM Consult   Living arrangements for the past 2 months: Mobile Home                                      Prior Living Arrangements/Services Living arrangements for the past 2 months: Mobile Home Lives with:: Self              Current home services: DME    Activities of Daily Living Home Assistive Devices/Equipment: CBG Meter, CPAP ADL Screening (condition at time of admission) Patient's cognitive ability adequate to safely complete daily activities?: Yes Is the patient deaf or have difficulty hearing?: No Does the patient have difficulty seeing, even when wearing glasses/contacts?: No Does the patient have difficulty concentrating, remembering, or making decisions?: No Patient able to express need for assistance with ADLs?: Yes Does the patient have difficulty dressing or bathing?: No Independently performs ADLs?: Yes (appropriate for developmental age) Does the patient have  difficulty walking or climbing stairs?: Yes Weakness of Legs: None Weakness of Arms/Hands: None  Permission Sought/Granted                  Emotional Assessment Appearance:: Appears stated age Attitude/Demeanor/Rapport: Engaged Affect (typically observed): Accepting Orientation: : Oriented to Self, Oriented to Place, Oriented to  Time, Oriented to Situation   Psych Involvement: No (comment)  Admission diagnosis:  Hemoptysis [R04.2] Chest pain [R07.9] Chest pain, unspecified type [R07.9] Patient Active Problem List   Diagnosis Date Noted  . AKI (acute kidney injury) ([place]) 05/09/2019  . Hemoptysis 04/09/2019  . Shortness of breath 03/26/2019  . Uncontrolled type 2 diabetes mellitus with insulin therapy ([place]) 03/12/2019  . Acute on chronic heart failure with preserved ejection fraction (HFpEF) ([place])   . Acute kidney injury superimposed on chronic kidney disease ([place])   . CHF (congestive heart failure) ([place]) 03/01/2019  . Diastolic dysfunction 64/40/3474  . Iron deficiency anemia 12/31/2018  . Atopic dermatitis 11/24/2018  . Screening for colon cancer 11/06/2017  . Uncontrolled type 2 diabetes mellitus with hyperglycemia ([place]) 11/06/2017  . Dermatitis, unspecified 06/09/2017  . Atherosclerotic heart disease of native coronary artery without angina pectoris 06/09/2017  . 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 Obstructive sleep apnea, adult 06/09/2017  . Morbid (severe) obesity due to excess calories ([place]) 06/09/2017  . Cigarette nicotine dependence with nicotine-induced disorder  06/09/2017  . Type 2 diabetes mellitus with diabetic polyneuropathy ([place]) 06/09/2017  . Allergic rhinitis due to pollen 06/09/2017  . Mixed hyperlipidemia 06/09/2017  . Chronic obstructive pulmonary disease, unspecified ([place]) 06/09/2017  . Dyspnea on exertion 06/09/2017  . Wheezing 06/09/2017  . Dysuria 06/09/2017  . Snoring 06/09/2017  . Essential hypertension  06/09/2017  . Personal history of other malignant neoplasm of kidney 06/09/2017  . Pain in right hip 06/09/2017  . Impacted cerumen, bilateral 06/09/2017  . Secondary hyperparathyroidism, not elsewhere classified ([place]) 06/09/2017  . Type 2 diabetes mellitus with hyperglycemia ([place]) 06/09/2017  . Tinea corporis 06/09/2017   PCP:  [name], NP Pharmacy:   [facility], [place] [address] Phone: [phone] Fax: [facility] [place], [place] [address] Phone: [phone] Fax: [phone]     Social Determinants of Health (SDOH) Interventions    Readmission Risk Interventions Readmission Risk Prevention Plan 05/10/2019 04/27/2019  Transportation Screening Complete Complete  Medication Review Press photographer) Complete Complete  PCP or Specialist appointment within 3-5 days of discharge - Complete  HRI or [name] - Complete  SW Recovery Care/Counseling Consult - Not Complete  SW Consult Not Complete Comments - not needed  Palliative Care Screening Not Applicable Not [place] Not Applicable Not Applicable  Some recent data might be hidden

## 2019-05-10 NOTE — Plan of Care (Signed)
  Problem: Education: Goal: Knowledge of General Education information will improve Description: Including pain rating scale, medication(s)/side effects and non-pharmacologic comfort measures Outcome: Progressing  Pt understands POC.  He has no further questions at the moment.  Will continue to offer support and answer questions appropriately.   Problem: Clinical Measurements: Goal: Ability to maintain clinical measurements within normal limits will improve Outcome: Progressing Pt was bradycardia this shift.  Pt denies HA, chest pain, dizziness, etc.  Will continue to monitor and assess.   Problem: Clinical Measurements: Goal: Will remain free from infection Outcome: Progressing S/Sx of infection monitor and assessed q-shift/ PRN,along with VS.  Pt has remained afebrile thus far.  Will continue to monitor and assess.   Problem: Clinical Measurements: Goal: Respiratory complications will improve Outcome: Progressing Respiratory status monitor and assessed q-shift/ PRN, along with VS.  Pt is on RA with O2 saturations at 96% and respiration rate of 20 breaths per minute.  Pt does have OSA and needs a C-pap QHS.  He does not endorse SOB or DOE.  Will continue to monitor and assess.   Problem: Clinical Measurements: Goal: Cardiovascular complication will be avoided Outcome: Progressing Pt was bradycardia this shift.  Pt denies HA, chest pain, dizziness, etc.  All other VS within the 2100 hr were stable.  Will continue to monitor and assess.   Problem: Activity: Goal: Risk for activity intolerance will decrease Outcome: Progressing  Pt is able to ambulate to the bathroom with standby assist.  He is able to move freely in the bed and does not RN staff's assistance to help reposition himself.  Will continue to monitor and assess.   Problem: Nutrition: Goal: Adequate nutrition will be maintained Outcome: Progressing Pt is on a heart healthy/ carb control diet and is able to tolerate is well.   Will continue to monitor and assess.   Problem: Coping: Goal: Level of anxiety will decrease Outcome: Progressing Pt has not express c/o anxiety r/t his hospitalization.  Will continue to offer support.  Problem: Elimination: Goal: Will not experience complications related to urinary retention Outcome: Progressing Pt utilizes the urinal to expel his urine into.  He is on strict I/O's.  Will continue to monitor and assess.    Problem: Pain Managment: Goal: General experience of comfort will improve Outcome: Progressing Pt has denied pain thus far.  Will continue to monitor and assess.   Problem: Skin Integrity: Goal: Risk for impaired skin integrity will decrease Outcome: Progressing Skin integrity monitored and assessed q-shift/ PRN, along with VS.  Instructed pt to turn q2 hours to prevent further skin impairment.  Tubes and drains assessed for device related pressures sores.  Will continue to monitor and assess.

## 2019-05-10 NOTE — Consult Note (Addendum)
Reason for Consult: Hemoptysis Referring Physician: Nicolette Bang, MD  Larry Callahan is an 63 y.o. male.  HPI: 63 year old obese gentleman, former smoker with a history as noted below, presented with several day history of minor hemoptysis.  He has had some cough but no sputum production.  No fevers, chills or sweats.  Has had some vague anterior chest pain.  Not a very reliable historian.  Recently admitted for diastolic dysfunction with exacerbation of CHF due to the same.  No orthopnea or paroxysmal nocturnal dyspnea.  Has a history of obstructive sleep apnea and states he is compliant with CPAP.  Hemoptysis has pretty much subsided.  The patient does state that he is unsure whether he is actually coughing up blood or if it is coming from his stomach.  Mostly described as streaky.  He had several episodes yesterday but none today.  CT chest without contrast was negative for any lesions or parenchymal abnormalities.  Contrast could not be utilized due to the patient's renal insufficiency.  He voices no other complaint.  He is followed by Dr. Devona Konig for pulmonary/OSA issues.  Recent right heart cath did not show any evidence of high pressures after the patient had been diuresed.  Pathology from recent EGD (04/12/2019) showed: Active gastritis, peptic duodenitis, reflux esophagitis.  H pylori test was negative.  Patient endorses compliance with PPI therapy.  Past Medical History:  Diagnosis Date  . Allergy   . Coronary artery disease   . Diabetes mellitus without complication ([place])   . Diastolic CHF ([place])   . GERD (gastroesophageal reflux disease)   . Hyperlipidemia   . Hypertension   . Renal insufficiency   . Sleep apnea     Past Surgical History:  Procedure Laterality Date  . BACK SURGERY    . CHOLECYSTECTOMY    . COLONOSCOPY WITH PROPOFOL N/A 04/12/2019   Procedure: COLONOSCOPY WITH PROPOFOL;  Surgeon: [name], MD;  Location: [facility] ENDOSCOPY;  Service:  Gastroenterology;  Laterality: N/A;  . ESOPHAGOGASTRODUODENOSCOPY N/A 04/12/2019   Procedure: ESOPHAGOGASTRODUODENOSCOPY (EGD);  Surgeon: [name], MD;  Location: [facility] ENDOSCOPY;  Service: Gastroenterology;  Laterality: N/A;  . left arm surgery    . nephrectomy Left   . PARATHYROIDECTOMY    . RIGHT HEART CATH N/A 03/29/2019   Procedure: RIGHT HEART CATH;  Surgeon: [name], MD;  Location: [place] CV LAB;  Service: Cardiovascular;  Laterality: N/A;    Family History  Problem Relation Age of Onset  . Diabetes Mother   . Lung cancer Father   . Diabetes Sister   . Hypertension Sister   . Heart disease Sister   . Cancer Sister   . Bone cancer Brother     Social History:  reports that he has quit smoking. He has quit using smokeless tobacco.  His smokeless tobacco use included chew. He reports that he does not drink alcohol or use drugs.  Allergies:  Allergies  Allergen Reactions  . Penicillins Rash    Medications:  I have reviewed the patient's current medications. Prior to Admission:  Medications Prior to Admission  Medication Sig Dispense Refill Last Dose  . acetaminophen (TYLENOL) 325 MG tablet Take 2 tablets (650 mg total) by mouth every 4 (four) hours as needed for headache or mild pain.   Unknown at PRN  . albuterol (VENTOLIN HFA) 108 (90 Base) MCG/ACT inhaler Inhale 2 puffs into the lungs every 6 (six) hours as needed for wheezing or shortness of breath. [place]  g 0 Unknown at PRN  . amLODipine (NORVASC) 10 MG tablet Take 1 tablet (10 mg total) by mouth daily. 90 tablet 2 05/09/2019 at 0500  . aspirin EC 81 MG tablet Take 81 mg by mouth daily.   05/09/2019 at 0500  . Blood Glucose Calibration (TRUE METRIX LEVEL 1) Low SOLN Use as directed 1 each 1   . Blood Glucose Monitoring Suppl (TRUE METRIX AIR GLUCOSE METER) DEVI 1 Device by Does not apply route 2 (two) times daily. 1 Device 0   . clobetasol cream (TEMOVATE) 5.18 % Apply 1 application topically 2  (two) times daily. 60 g 1 05/09/2019 at 0500  . ferrous sulfate 325 (65 FE) MG EC tablet Take 1 tablet (325 mg total) by mouth 2 (two) times daily with a meal. 60 tablet 0 05/09/2019 at 0500  . Fluticasone-Salmeterol (ADVAIR DISKUS) 250-50 MCG/DOSE AEPB Inhale 1 puff into the lungs 2 (two) times daily. 60 each 0 05/09/2019 at 0500  . furosemide (LASIX) 80 MG tablet Take 1 tablet (80 mg total) by mouth daily. 30 tablet 0 05/09/2019 at 0500  . gemfibrozil (LOPID) 600 MG tablet Take 1 tablet (600 mg total) by mouth 2 (two) times daily with a meal. 180 tablet 1 05/09/2019 at 0500  . glimepiride (AMARYL) 2 MG tablet Take 1 tablet (2 mg total) by mouth daily before breakfast. 30 tablet 3 05/09/2019 at 0500  . glucose blood (TRUE METRIX BLOOD GLUCOSE TEST) test strip Use as instructed twice daily diag E11.65 100 each 3   . hydrALAZINE (APRESOLINE) 100 MG tablet Take 1 tablet (100 mg total) by mouth 3 (three) times daily. 60 tablet 1 05/09/2019 at 0500  . Insulin Glargine, 1 Unit Dial, (TOUJEO SOLOSTAR) 300 UNIT/ML SOPN Inject 26 Units into the skin every evening. 5 pen 5 05/08/2019 at 2000  . ipratropium-albuterol (DUONEB) 0.5-2.5 (3) MG/3ML SOLN Take 3 mLs by nebulization every 6 (six) hours as needed. 1080 mL 2 Unknown at PRN  . isosorbide mononitrate (IMDUR) 30 MG 24 hr tablet Take 1 tablet (30 mg total) by mouth 2 (two) times daily. 60 tablet 0 05/09/2019 at 0500  . loratadine (CLARITIN) 10 MG tablet Take 1 tablet (10 mg total) by mouth daily.   05/09/2019 at 0500  . metoprolol succinate (TOPROL-XL) 50 MG 24 hr tablet Take 1 tablet (50 mg total) by mouth at bedtime. Take with or immediately following a meal. 90 tablet 2 05/08/2019 at 2000  . nicotine (NICODERM CQ - DOSED IN MG/24 HOURS) 21 mg/24hr patch Place 1 patch (21 mg total) onto the skin daily. 28 patch 0   . NOVOFINE 32G X 6 MM MISC To use with  injections daily 100 each 4   . omeprazole (PRILOSEC) 40 MG capsule Take 1 capsule (40 mg total) by  mouth daily. 90 capsule 1 05/09/2019 at 0500  . predniSONE (DELTASONE) 20 MG tablet Take 1 tablet (20 mg total) by mouth daily. 3 tablet 0 05/09/2019 at 0500  . tiotropium (SPIRIVA HANDIHALER) 18 MCG inhalation capsule Place 1 capsule (18 mcg total) into inhaler and inhale daily. 30 capsule 0 05/09/2019 at 0500  . triamcinolone ointment (KENALOG) 0.1 % Apply 1 application topically 2 (two) times daily. 80 g 2 05/09/2019 at 0500  . TRUEPLUS LANCETS 33G MISC Use as directed twice daily diag E11.65 100 each 3   . ferrous gluconate (FERGON) 324 MG tablet Take 1 tablet (324 mg total) by mouth daily with breakfast. (Patient not taking: Reported on 04/24/2019)  30 tablet 3 Not Taking at Unknown time    Results for orders placed or performed during the hospital encounter of 05/09/19 (from the past 48 hour(s))  Lipase, blood     Status: None   Collection Time: 05/09/19  9:11 AM  Result Value Ref Range   Lipase 40 11 - 51 U/L    Comment: Performed at [facility], [address] [place], [place] [address]  Comprehensive metabolic panel     Status: Abnormal   Collection Time: 05/09/19  9:11 AM  Result Value Ref Range   Sodium 140 135 - 145 mmol/L   Potassium 3.7 3.5 - 5.1 mmol/L   Chloride 108 98 - 111 mmol/L   CO2 20 (L) 22 - 32 mmol/L   Glucose, Bld 174 (H) 70 - 99 mg/dL   BUN 46 (H) 8 - 23 mg/dL   Creatinine, Ser 2.25 (H) 0.61 - 1.24 mg/dL   Calcium 9.1 8.9 - 10.3 mg/dL   Total Protein 6.7 6.5 - 8.1 g/dL   Albumin 3.1 (L) 3.5 - 5.0 g/dL   AST 25 15 - 41 U/L   ALT 18 0 - 44 U/L   Alkaline Phosphatase 60 38 - 126 U/L   Total Bilirubin 0.6 0.3 - 1.2 mg/dL   GFR calc non Af Amer 29 (L) >60 mL/min   GFR calc Af Amer 34 (L) >60 mL/min   Anion gap 12 5 - 15    Comment: Performed at [facility], [address] [place], [place] [address]  CBC     Status: Abnormal   Collection Time: 05/09/19  9:11 AM  Result Value Ref Range   WBC 7.3 4.0 - 10.5 K/uL   RBC 3.82 (L) 4.22 -  5.81 MIL/uL   Hemoglobin 10.6 (L) 13.0 - 17.0 g/dL   HCT 32.2 (L) 39.0 - 52.0 %   MCV 84.3 80.0 - 100.0 fL   MCH 27.7 26.0 - 34.0 pg   MCHC 32.9 30.0 - 36.0 g/dL   RDW 17.7 (H) 11.5 - 15.5 %   Platelets 222 150 - 400 K/uL   nRBC 0.0 0.0 - 0.2 %    Comment: Performed at [facility], [place], [place] [address]  Troponin I (High Sensitivity)     Status: None   Collection Time: 05/09/19  9:11 AM  Result Value Ref Range   Troponin I (High Sensitivity) 15 <18 ng/L    Comment: (NOTE) Elevated high sensitivity troponin I (hsTnI) values and significant  changes across serial measurements may suggest ACS but many other  chronic and acute conditions are known to elevate hsTnI results.  Refer to the "Links" section for chest pain algorithms and additional  guidance. Performed at [facility], [address] [place], [place] [address]   Hemoglobin A1c     Status: Abnormal   Collection Time: 05/09/19  1:51 PM  Result Value Ref Range   Hgb A1c MFr Bld 6.8 (H) 4.8 - 5.6 %    Comment: (NOTE) Pre diabetes:          5.7%-6.4% Diabetes:              >6.4% Glycemic control for   <7.0% adults with diabetes    Mean Plasma Glucose 148.46 mg/dL    Comment: Performed at [facility] [address] [place], [place] [address]  POC SARS Coronavirus 2 Ag     Status: None   Collection Time: 05/09/19  2:56 PM  Result Value Ref Range   SARS Coronavirus 2 Ag NEGATIVE NEGATIVE    Comment: (NOTE) SARS-CoV-2 antigen NOT DETECTED.  Negative results are presumptive.  Negative results do not preclude SARS-CoV-2 infection and should not be used as the sole basis for treatment or other patient management decisions, including infection  control decisions, particularly in the presence of clinical signs and  symptoms consistent with COVID-19, or in those who have been in contact with the virus.  Negative results must be combined with clinical observations, patient  history, and epidemiological information. The expected result is Negative. Fact Sheet for Patients: PodPark.tn Fact Sheet for Healthcare Providers: GiftContent.is This test is not yet approved or cleared by the [place] FDA and  has been authorized for detection and/or diagnosis of SARS-CoV-2 by FDA under an Emergency Use Authorization (EUA).  This EUA will remain in effect (meaning this test can be used) for the duration of  the COVID-19 de claration under Section 564(b)(1) of the Act, 21 U.S.C. section 360bbb-3(b)(1), unless the authorization is terminated or revoked sooner.   Glucose, capillary     Status: Abnormal   Collection Time: 05/09/19  5:07 PM  Result Value Ref Range   Glucose-Capillary 174 (H) 70 - 99 mg/dL  Glucose, capillary     Status: Abnormal   Collection Time: 05/09/19  9:00 PM  Result Value Ref Range   Glucose-Capillary 324 (H) 70 - 99 mg/dL   Comment 1 Notify RN   Glucose, capillary     Status: Abnormal   Collection Time: 05/09/19 10:54 PM  Result Value Ref Range   Glucose-Capillary 303 (H) 70 - 99 mg/dL  CMP 3 days     Status: Abnormal   Collection Time: 05/10/19  4:57 AM  Result Value Ref Range   Sodium 137 135 - 145 mmol/L   Potassium 3.6 3.5 - 5.1 mmol/L   Chloride 105 98 - 111 mmol/L   CO2 20 (L) 22 - 32 mmol/L   Glucose, Bld 218 (H) 70 - 99 mg/dL   BUN 49 (H) 8 - 23 mg/dL   Creatinine, Ser 2.15 (H) 0.61 - 1.24 mg/dL   Calcium 9.0 8.9 - 10.3 mg/dL   Total Protein 6.2 (L) 6.5 - 8.1 g/dL   Albumin 2.8 (L) 3.5 - 5.0 g/dL   AST 19 15 - 41 U/L   ALT 17 0 - 44 U/L   Alkaline Phosphatase 53 38 - 126 U/L   Total Bilirubin 0.5 0.3 - 1.2 mg/dL   GFR calc non Af Amer 31 (L) >60 mL/min   GFR calc Af Amer 36 (L) >60 mL/min   Anion gap 12 5 - 15    Comment: Performed at [facility], [address] [place], [place] [address]  CBC Daily 5 days     Status: Abnormal   Collection Time:  05/10/19  4:57 AM  Result Value Ref Range   WBC 8.3 4.0 - 10.5 K/uL   RBC 3.81 (L) 4.22 - 5.81 MIL/uL   Hemoglobin 10.4 (L) 13.0 - 17.0 g/dL   HCT 32.7 (L) 39.0 - 52.0 %   MCV 85.8 80.0 - 100.0 fL   MCH 27.3 26.0 - 34.0 pg   MCHC 31.8 30.0 - 36.0 g/dL   RDW 16.9 (H) 11.5 - 15.5 %   Platelets 242 150 - 400 K/uL   nRBC 0.0 0.0 - 0.2 %    Comment: Performed at [facility], [address] [place], [place]  [address]  Mg Daily 5 days     Status: None   Collection Time: 05/10/19  4:57 AM  Result Value Ref Range   Magnesium 2.0 1.7 - 2.4 mg/dL    Comment: Performed at [facility], [address] [place], [place] [address]  Glucose, capillary     Status: Abnormal   Collection Time: 05/10/19  7:54 AM  Result Value Ref Range   Glucose-Capillary 131 (H) 70 - 99 mg/dL  Glucose, capillary     Status: Abnormal   Collection Time: 05/10/19 11:46 AM  Result Value Ref Range   Glucose-Capillary 166 (H) 70 - 99 mg/dL  Glucose, capillary     Status: Abnormal   Collection Time: 05/10/19  4:36 PM  Result Value Ref Range   Glucose-Capillary 225 (H) 70 - 99 mg/dL  Protime-INR     Status: None   Collection Time: 05/10/19  4:40 PM  Result Value Ref Range   Prothrombin Time 12.9 11.4 - 15.2 seconds   INR 1.0 0.8 - 1.2    Comment: (NOTE) INR goal varies based on device and disease states. Performed at [facility], [address] [place], [place] [address]   APTT     Status: None   Collection Time: 05/10/19  4:40 PM  Result Value Ref Range   aPTT 31 24 - 36 seconds    Comment: Performed at [facility], [address] [place], [place] [address]  Brain natriuretic peptide     Status: None   Collection Time: 05/10/19  4:40 PM  Result Value Ref Range   B Natriuretic Peptide 75.0 0.0 - 100.0 pg/mL    Comment: Performed at [facility], [address] [place],  [address]  Fibrin derivatives D-[facility] only)     Status: Abnormal    Collection Time: 05/10/19  4:40 PM  Result Value Ref Range   Fibrin derivatives D-dimer ([facility]) 627.45 (H) 0.00 - 499.00 ng/mL (FEU)    Comment: (NOTE) <> Exclusion of Venous Thromboembolism (VTE) - OUTPATIENT ONLY   (Emergency Department or [place])   0-499 ng/ml (FEU): With a low to intermediate pretest probability                      for VTE this test result excludes the diagnosis                      of VTE.   >499 ng/ml (FEU) : VTE not excluded; additional work up for VTE is                      required. <> Testing on Inpatients and Evaluation of Disseminated Intravascular   Coagulation (DIC) Reference Range:   0-499 ng/ml (FEU) Performed at [facility], [address] [place],  [address]     Dg Chest 2 View  Result Date: 05/09/2019 CLINICAL DATA:  Spitting up blood with lower extremity edema and generalized chest and upper abdominal pain since this morning. EXAM: CHEST - 2 VIEW COMPARISON:  04/24/2019 FINDINGS: Lungs are adequately inflated and otherwise clear. Cardiomediastinal silhouette and remainder of the exam is unchanged. IMPRESSION: No active cardiopulmonary disease. Electronically Signed   By: Marin Olp M.D.   On: 05/09/2019 09:48   Ct Chest Wo Contrast  Result Date: 05/09/2019 CLINICAL DATA:  Nonproductive cough, hemoptysis, bilateral leg swelling EXAM: CT CHEST WITHOUT CONTRAST TECHNIQUE: Multidetector CT imaging of the chest was performed  following the standard protocol without IV contrast. COMPARISON:  Chest radiograph dated 05/09/2019. CTA chest dated 02/20/2019. FINDINGS: Cardiovascular: The heart is normal in size. No pericardial effusion. No evidence of thoracic aortic aneurysm. Atherosclerotic calcifications of the aortic arch. Coronary atherosclerosis of the LAD. Mediastinum/Nodes: No suspicious mediastinal lymphadenopathy. Visualized thyroid is unremarkable. Lungs/Pleura: No suspicious pulmonary nodules. No focal consolidation. Mild  centrilobular emphysematous changes in the bilateral upper lobes. No pleural effusion or pneumothorax. Upper Abdomen: Visualized upper abdomen is notable for bilateral renal cysts measuring up to 7.5 cm in the left upper kidney, incompletely visualized. Musculoskeletal: Degenerative changes of the visualized thoracolumbar spine. IMPRESSION: No evidence of acute cardiopulmonary disease. Aortic Atherosclerosis (ICD10-I70.0) and Emphysema (ICD10-J43.9). Electronically Signed   By: Julian Hy M.D.   On: 05/09/2019 11:50    Review of Systems  Constitutional: Negative.   HENT: Negative.   Respiratory: Positive for hemoptysis (Patient unsure whether he is coughing up blood or actually coming from his GI tract).   Cardiovascular: Positive for chest pain and leg swelling.  Gastrointestinal: Positive for heartburn.  Genitourinary: Negative.   Musculoskeletal: Negative.   Skin: Negative.   Neurological: Negative.   Endo/Heme/Allergies: Negative.   Psychiatric/Behavioral: Negative.   All other systems reviewed and are negative.  Blood pressure (!) 148/66, pulse 75, temperature 98 F (36.7 C), temperature source Oral, resp. rate 18, height 5\' 9"  (1.753 m), weight 118.6 kg, SpO2 95 %. Physical Exam  Constitutional: He is oriented to person, place, and time. He appears well-developed. He is cooperative.  Non-toxic appearance. No distress.  Obese  HENT:  Head: Normocephalic and atraumatic.  Right Ear: External ear normal.  Left Ear: External ear normal.  Nose: Nose normal.  Very poor dentition, Mallampati VI airway, could not assess properly for oral lesions due to crowded airway.Moist oral mucosa.  Neck: Neck supple. No JVD present. No tracheal deviation present. No thyromegaly present.  Cardiovascular: Normal rate, regular rhythm, normal heart sounds and intact distal pulses.  No murmur heard. Respiratory: Effort normal. No stridor. No respiratory distress. He has no wheezes. He has no rales.   GI: Soft. He exhibits no distension.  Protuberant abdomen  Musculoskeletal: Normal range of motion.        General: No tenderness.     Comments: Trace edema  Lymphadenopathy:    He has no cervical adenopathy.  Neurological: He is alert and oriented to person, place, and time.  No focal deficit  Skin: Skin is warm and dry.  Psychiatric: He has a normal mood and affect. His behavior is normal.    Assessment/Plan:  Minor hemoptysis Poor dentition Most common etiology of hemoptysis is usually infection Patient fairly asymptomatic no evidence of structural issue on CT Check coags, check fibrinogen Monitor for recurrence, consider bronchoscopy for persistent hemoptysis If fibrinogen elevated check VQ scan (renal insufficiency, chest pain) Empiric treatment with Biaxin 500 mg twice a day H&H is stable for this patient Follow-up with his primary pulmonologist as an outpatient Consider dental evaluation as he does have poor dentition   Gastroesophageal reflux with esophagitis Peptic duodenitis and gastritis Continue PPI twice daily Continue antireflux measures   Chronic renal insufficiency Solitary kidney This issue adds complexity to his management Avoid nephrotoxins  Chronic iron deficiency anemia This issue adds complexity to his management  OSA on CPAP Morbid obesity Continue CPAP use as prescribed Weight loss recommended   Thank you for allowing me to participate in this patient's care.  Renold Don, MD [place] [facility]  05/10/2019, 8:06 PM    This note was dictated using voice recognition software/Dragon.  Despite best efforts to proofread, errors can occur which can change the meaning.  Any change was purely unintentional.

## 2019-05-11 ENCOUNTER — Inpatient Hospital Stay: Payer: Medicare PPO

## 2019-05-11 LAB — COMPREHENSIVE METABOLIC PANEL
ALT: 17 U/L (ref 0–44)
AST: 19 U/L (ref 15–41)
Albumin: 3 g/dL — ABNORMAL LOW (ref 3.5–5.0)
Alkaline Phosphatase: 49 U/L (ref 38–126)
Anion gap: 11 (ref 5–15)
BUN: 49 mg/dL — ABNORMAL HIGH (ref 8–23)
CO2: 22 mmol/L (ref 22–32)
Calcium: 9 mg/dL (ref 8.9–10.3)
Chloride: 107 mmol/L (ref 98–111)
Creatinine, Ser: 1.9 mg/dL — ABNORMAL HIGH (ref 0.61–1.24)
GFR calc Af Amer: 42 mL/min — ABNORMAL LOW (ref >60)
GFR calc non Af Amer: 36 mL/min — ABNORMAL LOW (ref >60)
Glucose, Bld: 60 mg/dL — ABNORMAL LOW (ref 70–99)
Potassium: 3.3 mmol/L — ABNORMAL LOW (ref 3.5–5.1)
Sodium: 140 mmol/L (ref 135–145)
Total Bilirubin: 0.6 mg/dL (ref 0.3–1.2)
Total Protein: 6.5 g/dL (ref 6.5–8.1)

## 2019-05-11 LAB — CBC
HCT: 33.1 % — ABNORMAL LOW (ref 39.0–52.0)
Hemoglobin: 10.5 g/dL — ABNORMAL LOW (ref 13.0–17.0)
MCH: 27.1 pg (ref 26.0–34.0)
MCHC: 31.7 g/dL (ref 30.0–36.0)
MCV: 85.5 fL (ref 80.0–100.0)
Platelets: 256 10*3/uL (ref 150–400)
RBC: 3.87 MIL/uL — ABNORMAL LOW (ref 4.22–5.81)
RDW: 16.9 % — ABNORMAL HIGH (ref 11.5–15.5)
WBC: 9.6 10*3/uL (ref 4.0–10.5)
nRBC: 0 % (ref 0.0–0.2)

## 2019-05-11 LAB — GLUCOSE, CAPILLARY
Glucose-Capillary: 69 mg/dL — ABNORMAL LOW (ref 70–99)
Glucose-Capillary: 93 mg/dL (ref 70–99)
Glucose-Capillary: 316 mg/dL — ABNORMAL HIGH (ref 70–99)
Glucose-Capillary: 233 mg/dL — ABNORMAL HIGH (ref 70–99)
Glucose-Capillary: 254 mg/dL — ABNORMAL HIGH (ref 70–99)

## 2019-05-11 LAB — MAGNESIUM: Magnesium: 2.1 mg/dL (ref 1.7–2.4)

## 2019-05-11 MED ORDER — TECHNETIUM TO 99M ALBUMIN AGGREGATED
4.1900 | Freq: Once | INTRAVENOUS | Status: AC | PRN
  Administered 2019-05-11: 4.19 via INTRAVENOUS

## 2019-05-11 MED ORDER — TECHNETIUM TC 99M DIETHYLENETRIAME-PENTAACETIC ACID
31.8700 | Freq: Once | INTRAVENOUS | Status: AC | PRN
  Administered 2019-05-11: 31.87 via RESPIRATORY_TRACT

## 2019-05-11 NOTE — Progress Notes (Signed)
PROGRESS NOTE    Larry Callahan  IFO:[id number] DOB: [birth year] DOA: 05/09/2019 PCP: Larry Freshwater, NP   Brief Narrative:  Per admitting physician: Larry Scarano Riggansis a 63 y.o.malewith medical history significant ofCAD, COPD, essential hypertension, obesity, iron deficiency anemia presented to the hospital with complaints of hemoptysis. Patient states over the course the last several days he has had few episodes of hemoptysis/blood in his spit but denies much of coughing, fevers chills or other complaints. But this morning he had couple of episodes which were little bit more than usual therefore came to the hospital. In the ER he had couple of small episodes in a tissue as well. CT of the chest without contrast was negative. Was in mild AKI therefore did not receive contrast. Baseline creatinine 1.8, admission creatinine 2.2. He was admitted for overnight observation.  Admitted here about 2 weeks ago for shortness of breath secondary to pulmonary edema requiring diuresis. Cardiology had seen the patient at that time.  Had endoscopy and colonoscopy about a month ago which were both negative for any acute pathology.  Overnight patient had reported initial hemoptysist had cleared but now is having recurrent bright red blood hemoptysis witnessed by staff this afternoon-pulm consult pending   Assessment & Plan:   Principal Problem:   Hemoptysis Active Problems:   Morbid (severe) obesity due to excess calories ([place])   Type 2 diabetes mellitus with diabetic polyneuropathy ([place])   Mixed hyperlipidemia   Chronic obstructive pulmonary disease, unspecified (H[facility])   Essential hypertension   CHF (congestive heart failure) (H[facility])   AKI (acute kidney injury) ([place])   Recurrent hemoptysis -Patient an additional episodes of hemoptysis this afternoonalso , bright red blood witnessed by staff again -Pulmonary consultation appreciated-VQ read pending -Hemoglobin  10.4>10.5, 10.6 on admission -As previously noted CT of chest without contrast was negative for acute findings -EGD April 12, 2019 was negative -Because of persistent hemoptysis patient will remain in the hospital, discussed the case with pulmonology no indication for acute bronchoscopy, no indication for calling in emergency team, planned pulmonary follow-up likely Monday with possible bronchoscopy. -We will make the patient n.p.o. Sunday night  Acute kidney injury on CKD stage IIIb -Baseline creatinine 1.8. Admission creatinine 2.2.  -Continue with gentle hydration, monitor.  - Avoid nephrotoxic drugs.  History of COPD, stable -As needed bronchodilators -Patient is not in acute exacerbation  Essential hypertension -Continue with current medications -Norvasc 10 mg daily,  -Lasix 80 mg daily,  -hydralazine 100 mg 3 times daily,  -isosorbide mononitrate 30 mg twice daily,  -Toprol-XL 50 mg daily  CAD  -Recently seen by cardiology on 04/28/2019 ,  -Myoview 9/20 showed no ischemia.  -Supposed to get outpatient work-up including colonoscopy before placing him on DAPT.  Diabetes mellitus type 2 Peripheral neuropathy secondary to diabetes mellitus type 2 -Continue to hold home oral medication.  - Lantus 26 units daily -Insulin sliding scale Accu-Chek.  Morbid obesity with BMI greater than 35. -Recommend weight loss  DVT prophylaxis: SCD/Compression stockings  Code Status: Full code    Code Status Orders  (From admission, onward)         Start     Ordered   05/09/19 1352  Full code  Continuous     11 /26/20 1352        Code Status History    Date Active Date Inactive Code Status Order ID Comments User Context   04/24/2019 2038 04/30/2019 1406 Full Code [id number]  Caren Griffins, MD Inpatient  04/10/2019 1958 04/13/2019 1718 Full Code [id number]  Loletha Grayer, MD ED   03/26/2019 1532 03/31/2019 1723 Full Code [id number]  Loletha Grayer, MD ED    03/01/2019 1536 03/05/2019 2055 Full Code [id number]  Loletha Grayer, MD ED   Advance Care Planning Activity     Family Communication: Discussed with patient sister [name] at [phone] Disposition Plan:    Patient remained inpatient for continued monitoring, expert subspecialty consultation, monitoring hemoglobin, respiratory support, additional subspecialty evaluation with possible bronchoscopy on Monday. Consults called: None Admission status: Inpatient   Consultants:   Pulmonology  Procedures:  Dg Chest 2 View  Result Date: 05/09/2019 CLINICAL DATA:  Spitting up blood with lower extremity edema and generalized chest and upper abdominal pain since this morning. EXAM: CHEST - 2 VIEW COMPARISON:  04/24/2019 FINDINGS: Lungs are adequately inflated and otherwise clear. Cardiomediastinal silhouette and remainder of the exam is unchanged. IMPRESSION: No active cardiopulmonary disease. Electronically Signed   By: Marin Olp M.D.   On: 05/09/2019 09:48   Dg Chest 2 View  Result Date: 04/24/2019 CLINICAL DATA:  complaints of shortness of breath, fatigue since this morning. Pt here for same recently, admission due to CHF. HTN, CHF, DM, former smoker EXAM: CHEST - 2 VIEW COMPARISON:  04/10/2019 FINDINGS: Mild central pulmonary vascular congestion and mild diffuse interstitial prominent, increased since previous. Heart size upper limits normal. Aortic Atherosclerosis (ICD10-170.0). No effusion. Anterior vertebral endplate spurring at multiple levels in the lower thoracic spine. IMPRESSION: Worsening central pulmonary vascular congestion and interstitial edema. Electronically Signed   By: Lucrezia Europe M.D.   On: 04/24/2019 12:25   Ct Head Wo Contrast  Result Date: 04/29/2019 CLINICAL DATA:  Syncopal episode today with trauma to the right side of the head EXAM: CT HEAD WITHOUT CONTRAST TECHNIQUE: Contiguous axial images were obtained from the base of the skull through the vertex without  intravenous contrast. COMPARISON:  03/26/2019 FINDINGS: Brain: The brain shows a normal appearance without evidence of malformation, atrophy, old or acute small or large vessel infarction, mass lesion, hemorrhage, hydrocephalus or extra-axial collection. Vascular: No hyperdense vessel. No evidence of atherosclerotic calcification. Skull: Normal.  No traumatic finding.  No focal bone lesion. Sinuses/Orbits: Sinuses are clear. Orbits appear normal. Mastoids are clear. Other: None significant IMPRESSION: Normal head CT Electronically Signed   By: Nelson Chimes M.D.   On: 04/29/2019 16:59   Ct Chest Wo Contrast  Result Date: 05/09/2019 CLINICAL DATA:  Nonproductive cough, hemoptysis, bilateral leg swelling EXAM: CT CHEST WITHOUT CONTRAST TECHNIQUE: Multidetector CT imaging of the chest was performed following the standard protocol without IV contrast. COMPARISON:  Chest radiograph dated 05/09/2019. CTA chest dated 02/20/2019. FINDINGS: Cardiovascular: The heart is normal in size. No pericardial effusion. No evidence of thoracic aortic aneurysm. Atherosclerotic calcifications of the aortic arch. Coronary atherosclerosis of the LAD. Mediastinum/Nodes: No suspicious mediastinal lymphadenopathy. Visualized thyroid is unremarkable. Lungs/Pleura: No suspicious pulmonary nodules. No focal consolidation. Mild centrilobular emphysematous changes in the bilateral upper lobes. No pleural effusion or pneumothorax. Upper Abdomen: Visualized upper abdomen is notable for bilateral renal cysts measuring up to 7.5 cm in the left upper kidney, incompletely visualized. Musculoskeletal: Degenerative changes of the visualized thoracolumbar spine. IMPRESSION: No evidence of acute cardiopulmonary disease. Aortic Atherosclerosis (ICD10-I70.0) and Emphysema (ICD10-J43.9). Electronically Signed   By: Julian Hy M.D.   On: 05/09/2019 11:50   Ct Renal Stone Study  Result Date: 04/24/2019 CLINICAL DATA:  Left flank pain. Generalized  abdominal pain. Bloody stools. EXAM: CT  ABDOMEN AND PELVIS WITHOUT CONTRAST TECHNIQUE: Multidetector CT imaging of the abdomen and pelvis was performed following the standard protocol without IV contrast. COMPARISON:  01/31/2014 CT abdomen/pelvis. FINDINGS: Lower chest: No significant pulmonary nodules or acute consolidative airspace disease. Hepatobiliary: Normal liver size. No liver mass. Cholecystectomy. No biliary ductal dilatation. Pancreas: Normal, with no mass or duct dilation. Spleen: Normal size spleen. Granulomatous calcifications throughout the spleen. No splenic mass. Adrenals/Urinary Tract: Normal right adrenal. 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 Left nephrectomy with no mass or fluid collection in the left nephrectomy bed. No right hydronephrosis. No right renal stones. Multiple simple right renal cysts, largest 7.7 cm in the posterior lower right kidney. Normal caliber right ureter. No right ureteral stones. Normal bladder with no bladder stones. Stomach/Bowel: Normal non-distended stomach. Normal caliber small bowel with no small bowel wall thickening. Normal appendix. Marked left colonic diverticulosis, with no large bowel wall thickening or significant pericolonic fat stranding. Vascular/Lymphatic: Atherosclerotic nonaneurysmal abdominal aorta. No pathologically enlarged lymph nodes in the abdomen or pelvis. Reproductive: Mildly enlarged prostate. Other: No pneumoperitoneum, ascites or focal fluid collection. Moderate to large fat containing umbilical hernia, slightly increased from prior. Chronic diastasis of posterior left lateral abdominal wall. Musculoskeletal: No aggressive appearing focal osseous lesions. Moderate thoracolumbar spondylosis. IMPRESSION: 1. No acute abnormality. No evidence of bowel obstruction or acute bowel inflammation. Marked sigmoid diverticulosis, with  no evidence of acute diverticulitis. 2. No right hydronephrosis.  No urolithiasis. 3. 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 Given the large size and continued growth, surgical consultation may be considered. 4. Left nephrectomy. 5. Mild prostatomegaly. 6. Moderate to large fat containing umbilical hernia, slightly increased. 7.  Aortic Atherosclerosis (ICD10-I70.0). Electronically Signed   By: Ilona Sorrel M.D.   On: 04/24/2019 16:17   Dg Hip Unilat With Pelvis 2-3 Views Left  Result Date: 04/29/2019 CLINICAL DATA:  Fall EXAM: DG HIP (WITH OR WITHOUT PELVIS) 2-3V LEFT COMPARISON:  None. FINDINGS: Anatomic alignment is maintained. There is no acute fracture. Mild changes of osteoarthritis. IMPRESSION: No acute fracture. Electronically Signed   By: Macy Mis M.D.   On: 04/29/2019 12:06   Dg Hip Unilat With Pelvis 2-3 Views Right  Result Date: 04/29/2019 CLINICAL DATA:  Fall EXAM: DG HIP (WITH OR WITHOUT PELVIS) 2-3V RIGHT COMPARISON:  2011 FINDINGS: Alignment is anatomic. There is no acute fracture. Changes of osteoarthritis are present similar to 2011. IMPRESSION: No acute fracture at the right hip. Electronically Signed   By: Macy Mis M.D.   On: 04/29/2019 12:03     Antimicrobials:   None   Subjective: Patient admitted with hemoptysis Unfortunately continues to have small amounts of bright red blood Hemodynamically stable  Objective: Vitals:   05/10/19 2044 05/11/19 0407 05/11/19 0418 05/11/19 1314  BP: (!) 145/74 (!) 162/93  (!) 145/84  Pulse: 75 67  69  Resp: 18 16  16   Temp: 97.6 F (36.4 C) (!) 97.4 F (36.3 C)  97.7 F (36.5 C)  TempSrc: Oral Oral  Oral  SpO2: 95% 95%  99%  Weight:   121.2 kg   Height:        Intake/Output Summary (Last 24 hours) at 05/11/2019 1416 Last data filed at 05/11/2019 0936 Gross per 24 hour  Intake 1200 ml  Output  600 ml  Net 600 ml   Filed  Weights   05/09/19 0908 05/09/19 2100 05/11/19 0418  Weight: 119 kg 118.6 kg 121.2 kg    Examination:  General exam: Appears calm and comfortable  Respiratory system: Clear to auscultation. Respiratory effort normal. Cardiovascular system: S1 & S2 heard, RRR. No JVD, murmurs, rubs, gallops or clicks. No pedal edema. Gastrointestinal system: Abdomen is nondistended, soft and nontender. No organomegaly or masses felt. Normal bowel sounds heard. Central nervous system: Alert and oriented. No focal neurological deficits. Extremities: Warm well perfused no edema Skin: No rashes, lesions or ulcers Psychiatry: Judgement and insight appear normal although appears to have limited cognitive deficit. Mood & affect appropriate.     Data Reviewed: I have personally reviewed following labs and imaging studies  CBC: Recent Labs  Lab 05/09/19 0911 05/10/19 0457 05/11/19 0444  WBC 7.3 8.3 9.6  HGB 10.6* 10.4* 10.5*  HCT 32.2* 32.7* 33.1*  MCV 84.3 85.8 85.5  PLT 222 242 956   Basic Metabolic Panel: Recent Labs  Lab 05/09/19 0911 05/10/19 0457 05/11/19 0444  NA 140 137 140  K 3.7 3.6 3.3*  CL 108 105 107  CO2 20* 20* 22  GLUCOSE 174* 218* 60*  BUN 46* 49* 49*  CREATININE 2.25* 2.15* 1.90*  CALCIUM 9.1 9.0 9.0  MG  --  2.0 2.1   GFR: Estimated Creatinine Clearance: 49.8 mL/min (A) (by C-G formula based on SCr of 1.9 mg/dL (H)). Liver Function Tests: Recent Labs  Lab 05/09/19 0911 05/10/19 0457 05/11/19 0444  AST 25 19 19   ALT 18 17 17   ALKPHOS 60 53 49  BILITOT 0.6 0.5 0.6  PROT 6.7 6.2* 6.5  ALBUMIN 3.1* 2.8* 3.0*   Recent Labs  Lab 05/09/19 0911  LIPASE 40   No results for input(s): AMMONIA in the last 168 hours. Coagulation Profile: Recent Labs  Lab 05/10/19 1640  INR 1.0   Cardiac Enzymes: No results for input(s): CKTOTAL, CKMB, CKMBINDEX, TROPONINI in the last 168 hours. BNP (last 3 results) No results for input(s): PROBNP in the last 8760  hours. HbA1C: Recent Labs    05/09/19 1351  HGBA1C 6.8*   CBG: Recent Labs  Lab 05/10/19 1636 05/10/19 2043 05/11/19 0741 05/11/19 0813 05/11/19 1314  GLUCAP 225* 231* 69* 93 316*   Lipid Profile: No results for input(s): CHOL, HDL, LDLCALC, TRIG, CHOLHDL, LDLDIRECT in the last 72 hours. Thyroid Function Tests: No results for input(s): TSH, T4TOTAL, FREET4, T3FREE, THYROIDAB in the last 72 hours. Anemia Panel: No results for input(s): VITAMINB12, FOLATE, FERRITIN, TIBC, IRON, RETICCTPCT in the last 72 hours. Sepsis Labs: No results for input(s): PROCALCITON, LATICACIDVEN in the last 168 hours.  No results found for this or any previous visit (from the past 240 hour(s)).       Radiology Studies: No results found.      Scheduled Meds:  amLODipine  10 mg Oral Daily   clarithromycin  500 mg Oral Q12H   ferrous sulfate  325 mg Oral BID WC   furosemide  80 mg Oral Daily   gemfibrozil  600 mg Oral BID WC   hydrALAZINE  100 mg Oral TID   insulin aspart  0-15 Units Subcutaneous TID WC   insulin aspart  0-5 Units Subcutaneous QHS   insulin aspart  2 Units Subcutaneous TID WC   insulin glargine  26 Units Subcutaneous QPM   ipratropium-albuterol  3 mL Nebulization TID   isosorbide mononitrate  30 mg Oral  BID   metoprolol succinate  50 mg Oral QHS   mometasone-formoterol  2 puff Inhalation BID   pantoprazole (PROTONIX) IV  40 mg Intravenous Q12H   predniSONE  20 mg Oral Daily   Continuous Infusions:   LOS: 1 day    Time spent: 35 min    Nicolette Bang, MD [facility]  If 7PM-7AM, please contact night-coverage  05/11/2019, 2:16 PM

## 2019-05-12 LAB — CBC
HCT: 33.5 % — ABNORMAL LOW (ref 39.0–52.0)
Hemoglobin: 10.7 g/dL — ABNORMAL LOW (ref 13.0–17.0)
MCH: 27.5 pg (ref 26.0–34.0)
MCHC: 31.9 g/dL (ref 30.0–36.0)
MCV: 86.1 fL (ref 80.0–100.0)
Platelets: 262 10*3/uL (ref 150–400)
RBC: 3.89 MIL/uL — ABNORMAL LOW (ref 4.22–5.81)
RDW: 16.9 % — ABNORMAL HIGH (ref 11.5–15.5)
WBC: 9.9 10*3/uL (ref 4.0–10.5)
nRBC: 0 % (ref 0.0–0.2)

## 2019-05-12 LAB — COMPREHENSIVE METABOLIC PANEL
ALT: 18 U/L (ref 0–44)
AST: 18 U/L (ref 15–41)
Albumin: 3.1 g/dL — ABNORMAL LOW (ref 3.5–5.0)
Alkaline Phosphatase: 55 U/L (ref 38–126)
Anion gap: 13 (ref 5–15)
BUN: 47 mg/dL — ABNORMAL HIGH (ref 8–23)
CO2: 21 mmol/L — ABNORMAL LOW (ref 22–32)
Calcium: 9 mg/dL (ref 8.9–10.3)
Chloride: 105 mmol/L (ref 98–111)
Creatinine, Ser: 2.15 mg/dL — ABNORMAL HIGH (ref 0.61–1.24)
GFR calc Af Amer: 36 mL/min — ABNORMAL LOW (ref >60)
GFR calc non Af Amer: 31 mL/min — ABNORMAL LOW (ref >60)
Glucose, Bld: 73 mg/dL (ref 70–99)
Potassium: 3.2 mmol/L — ABNORMAL LOW (ref 3.5–5.1)
Sodium: 139 mmol/L (ref 135–145)
Total Bilirubin: 0.5 mg/dL (ref 0.3–1.2)
Total Protein: 6.6 g/dL (ref 6.5–8.1)

## 2019-05-12 LAB — GLUCOSE, CAPILLARY
Glucose-Capillary: 170 mg/dL — ABNORMAL HIGH (ref 70–99)
Glucose-Capillary: 202 mg/dL — ABNORMAL HIGH (ref 70–99)
Glucose-Capillary: 268 mg/dL — ABNORMAL HIGH (ref 70–99)
Glucose-Capillary: 367 mg/dL — ABNORMAL HIGH (ref 70–99)
Glucose-Capillary: 71 mg/dL (ref 70–99)

## 2019-05-12 LAB — MAGNESIUM: Magnesium: 2.2 mg/dL (ref 1.7–2.4)

## 2019-05-12 NOTE — Progress Notes (Signed)
PROGRESS NOTE    Larry Callahan  DEY:[id number] DOB: 25-Dec-1955 DOA: 05/09/2019 PCP: Larry Freshwater, NP   Brief Narrative:  Per admitting physician: Larry Callahan Riggansis a 63 y.o.malewith medical history significant ofCAD, COPD, essential hypertension, obesity, iron deficiency anemia presented to the hospital with complaints of hemoptysis. Patient states over the course the last several days he has had few episodes of hemoptysis/blood in his spit but denies much of coughing, fevers chills or other complaints. But this morning he had couple of episodes which were little bit more than usual therefore came to the hospital. In the ER he had couple of small episodes in a tissue as well. CT of the chest without contrast was negative. Was in mild AKI therefore did not receive contrast. Baseline creatinine 1.8, admission creatinine 2.2. He was admitted for overnight observation.  Admitted here about 2 weeks ago for shortness of breath secondary to pulmonary edema requiring diuresis. Cardiology had seen the patient at that time.  Had endoscopy and colonoscopy about a month ago which were both negative for any acute pathology.  Patient again reported mild hemoptysis last night and again this morning  Assessment & Plan:   Principal Problem:   Hemoptysis Active Problems:   Morbid (severe) obesity due to excess calories (Larry Callahan)   Type 2 diabetes mellitus with diabetic polyneuropathy (Larry Callahan)   Mixed hyperlipidemia   Chronic obstructive pulmonary disease, unspecified (HCC)   Essential hypertension   CHF (congestive heart failure) (HCC)   AKI (acute kidney injury) (Larry Callahan)   Recurrent hemoptysis -Patient an additional episodes ofhemoptysisthis afternoonalso ,bright red bloodwitnessed by staff again -Pulmonary consultation appreciated-VQ read low probability -hemoglobin 10.4>10.5>10.7, 10.6 on admission -As previously noted CT of chest without contrast was negative for  acute findings -EGD April 12, 2019 was negative -Because of persistent hemoptysis patient will remain in the hospital, discussed the case with pulmonology no indication for acute bronchoscopy, no indication for calling in emergency team, planned pulmonary follow-up likely Monday with possible bronchoscopy. -We will make the patient n.p.o. Sunday night  Acute kidney injury on CKD stage IIIb -Baseline creatinine 1.8. Admission creatinine 2.2. -Continue with gentle hydration, monitor. -Avoid nephrotoxic drugs.  History of COPD, stable -As needed bronchodilators -Patient is not in acute exacerbation  Essential hypertension -Continue with current medications -Norvasc 10 mg daily, -Lasix 80 mg daily, -hydralazine 100 mg 3 times daily, -isosorbide mononitrate 30 mg twice daily, -Toprol-XL50 mg daily  CAD  -Recently seen by cardiology on 04/28/2019 , -Myoview 9/20 showed no ischemia. -Supposed to get outpatient work-up including colonoscopy before placing him on DAPT.  Diabetes mellitus type 2 Peripheral neuropathy secondary to diabetes mellitus type 2 -Continue to hold home oral medication. -Lantus 26 units daily -Insulin sliding scale Accu-Chek.  Morbid obesity with BMI greater than 35. -Recommend weight loss  DVT prophylaxis: SCD/Compression stockings  Code Status: full    Code Status Orders  (From admission, onward)         Start     Ordered   05/09/19 1352  Full code  Continuous     11 /26/20 1352        Code Status History    Date Active Date Inactive Code Status Order ID Comments User Context   04/24/2019 2038 04/30/2019 1406 Full Code [id number]  Caren Griffins, MD Inpatient   04/10/2019 1958 04/13/2019 1718 Full Code [id number]  Loletha Grayer, MD ED   03/26/2019 1532 03/31/2019 1723 Full Code [id number]  Loletha Grayer, MD ED   03/01/2019  1536 03/05/2019 2055 Full Code [id number]  Loletha Grayer, MD ED   Advance Care Planning  Activity     Family Communication: none today, will discuss with Larry Callahan Disposition Plan:   Patient remained inpatient for continued monitoring, expert subspecialty consultation, monitoring hemoglobin, respiratory support, additional subspecialty evaluation with possible bronchoscopy on Monday Consults called: None Admission status: Inpatient   Consultants:   pulm  Procedures:  Dg Chest 2 View  Result Date: 05/09/2019 CLINICAL DATA:  Spitting up blood with lower extremity edema and generalized chest and upper abdominal pain since this morning. EXAM: CHEST - 2 VIEW COMPARISON:  04/24/2019 FINDINGS: Lungs are adequately inflated and otherwise clear. Cardiomediastinal silhouette and remainder of the exam is unchanged. IMPRESSION: No active cardiopulmonary disease. Electronically Signed   By: Marin Olp M.D.   On: 05/09/2019 09:48   Dg Chest 2 View  Result Date: 04/24/2019 CLINICAL DATA:  complaints of shortness of breath, fatigue since this morning. Pt here for same recently, admission due to CHF. HTN, CHF, DM, former smoker EXAM: CHEST - 2 VIEW COMPARISON:  04/10/2019 FINDINGS: Mild central pulmonary vascular congestion and mild diffuse interstitial prominent, increased since previous. Heart size upper limits normal. Aortic Atherosclerosis (ICD10-170.0). No effusion. Anterior vertebral endplate spurring at multiple levels in the lower thoracic spine. IMPRESSION: Worsening central pulmonary vascular congestion and interstitial edema. Electronically Signed   By: Lucrezia Europe M.D.   On: 04/24/2019 12:25   Ct Head Wo Contrast  Result Date: 04/29/2019 CLINICAL DATA:  Syncopal episode today with trauma to the right side of the head EXAM: CT HEAD WITHOUT CONTRAST TECHNIQUE: Contiguous axial images were obtained from the base of the skull through the vertex without intravenous contrast. COMPARISON:  03/26/2019 FINDINGS: Brain: The brain shows a normal appearance without evidence of  malformation, atrophy, old or acute small or large vessel infarction, mass lesion, hemorrhage, hydrocephalus or extra-axial collection. Vascular: No hyperdense vessel. No evidence of atherosclerotic calcification. Skull: Normal.  No traumatic finding.  No focal bone lesion. Sinuses/Orbits: Sinuses are clear. Orbits appear normal. Mastoids are clear. Other: None significant IMPRESSION: Normal head CT Electronically Signed   By: Nelson Chimes M.D.   On: 04/29/2019 16:59   Ct Chest Wo Contrast  Result Date: 05/09/2019 CLINICAL DATA:  Nonproductive cough, hemoptysis, bilateral leg swelling EXAM: CT CHEST WITHOUT CONTRAST TECHNIQUE: Multidetector CT imaging of the chest was performed following the standard protocol without IV contrast. COMPARISON:  Chest radiograph dated 05/09/2019. CTA chest dated 02/20/2019. FINDINGS: Cardiovascular: The heart is normal in size. No pericardial effusion. No evidence of thoracic aortic aneurysm. Atherosclerotic calcifications of the aortic arch. Coronary atherosclerosis of the LAD. Mediastinum/Nodes: No suspicious mediastinal lymphadenopathy. Visualized thyroid is unremarkable. Lungs/Pleura: No suspicious pulmonary nodules. No focal consolidation. Mild centrilobular emphysematous changes in the bilateral upper lobes. No pleural effusion or pneumothorax. Upper Abdomen: Visualized upper abdomen is notable for bilateral renal cysts measuring up to 7.5 cm in the left upper kidney, incompletely visualized. Musculoskeletal: Degenerative changes of the visualized thoracolumbar spine. IMPRESSION: No evidence of acute cardiopulmonary disease. Aortic Atherosclerosis (ICD10-I70.0) and Emphysema (ICD10-J43.9). Electronically Signed   By: Julian Hy M.D.   On: 05/09/2019 11:50   Nm Pulmonary Perf And Vent  Result Date: 05/11/2019 CLINICAL DATA:  Shortness of breath and hemoptysis EXAM: NUCLEAR MEDICINE VENTILATION - PERFUSION LUNG SCAN VIEWS: Anterior, posterior, left lateral, right  lateral, RPO, LPO, RAO, LAO-ventilation and perfusion RADIOPHARMACEUTICALS:  31.87 mCi of Tc-76m DTPA aerosol inhalation and 4.19 mCi Tc64m  MAA IV COMPARISON:  Chest radiograph May 08, 2016 FINDINGS: Ventilation: Radiotracer uptake is homogeneous and symmetric bilaterally. No focal ventilation defects are evident. Perfusion: Radiotracer uptake is homogeneous and symmetric bilaterally. No perfusion defects evident. IMPRESSION: No appreciable ventilation or perfusion defects. Very low probability of pulmonary embolus. Electronically Signed   By: Lowella Grip III M.D.   On: 05/11/2019 14:32   Ct Renal Stone Study  Result Date: 04/24/2019 CLINICAL DATA:  Left flank pain. Generalized abdominal pain. Bloody stools. EXAM: CT ABDOMEN AND PELVIS WITHOUT CONTRAST TECHNIQUE: Multidetector CT imaging of the abdomen and pelvis was performed following the standard protocol without IV contrast. COMPARISON:  01/31/2014 CT abdomen/pelvis. FINDINGS: Lower chest: No significant pulmonary nodules or acute consolidative airspace disease. Hepatobiliary: Normal liver size. No liver mass. Cholecystectomy. No biliary ductal dilatation. Pancreas: Normal, with no mass or duct dilation. Spleen: Normal size spleen. Granulomatous calcifications throughout the spleen. No splenic mass. Adrenals/Urinary Tract: Normal right adrenal. 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 Left nephrectomy with no mass or fluid collection in the left nephrectomy bed. No right hydronephrosis. No right renal stones. Multiple simple right renal cysts, largest 7.7 cm in the posterior lower right kidney. Normal caliber right ureter. No right ureteral stones. Normal bladder with no bladder stones. Stomach/Bowel: Normal non-distended stomach. Normal caliber small bowel with no small bowel wall thickening. Normal appendix. Marked left colonic  diverticulosis, with no large bowel wall thickening or significant pericolonic fat stranding. Vascular/Lymphatic: Atherosclerotic nonaneurysmal abdominal aorta. No pathologically enlarged lymph nodes in the abdomen or pelvis. Reproductive: Mildly enlarged prostate. Other: No pneumoperitoneum, ascites or focal fluid collection. Moderate to large fat containing umbilical hernia, slightly increased from prior. Chronic diastasis of posterior left lateral abdominal wall. Musculoskeletal: No aggressive appearing focal osseous lesions. Moderate thoracolumbar spondylosis. IMPRESSION: 1. No acute abnormality. No evidence of bowel obstruction or acute bowel inflammation. Marked sigmoid diverticulosis, with no evidence of acute diverticulitis. 2. No right hydronephrosis.  No urolithiasis. 3. 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 Given the large size and continued growth, surgical consultation may be considered. 4. Left nephrectomy. 5. Mild prostatomegaly. 6. Moderate to large fat containing umbilical hernia, slightly increased. 7.  Aortic Atherosclerosis (ICD10-I70.0). Electronically Signed   By: Ilona Sorrel M.D.   On: 04/24/2019 16:17   Dg Hip Unilat With Pelvis 2-3 Views Left  Result Date: 04/29/2019 CLINICAL DATA:  Fall EXAM: DG HIP (WITH OR WITHOUT PELVIS) 2-3V LEFT COMPARISON:  None. FINDINGS: Anatomic alignment is maintained. There is no acute fracture. Mild changes of osteoarthritis. IMPRESSION: No acute fracture. Electronically Signed   By: Macy Mis M.D.   On: 04/29/2019 12:06   Dg Hip Unilat With Pelvis 2-3 Views Right  Result Date: 04/29/2019 CLINICAL DATA:  Fall EXAM: DG HIP (WITH OR WITHOUT PELVIS) 2-3V RIGHT COMPARISON:  2011 FINDINGS: Alignment is anatomic. There is no acute fracture. Changes of osteoarthritis are present similar to 2011. IMPRESSION: No acute fracture at the right hip. Electronically Signed   By:  Macy Mis M.D.   On: 04/29/2019 12:03     Antimicrobials:   none   Subjective: Patient reported hemoptysis episode last night and again this morning less than 10 cc by description  Objective: Vitals:   05/11/19 1314 05/11/19 1959 05/12/19 0427 05/12/19 0820  BP: (!) 145/84 (!) 143/78 (!) 146/80 (!) 153/78  Pulse: 69 81 (!) 56 63  Resp: 16 18 18    Temp: 97.7 F (36.5 C) 97.8 F (36.6 C) 97.6 F (36.4 C)   TempSrc: Oral Oral Oral   SpO2: 99% 97% 95%   Weight:      Height:        Intake/Output Summary (Last 24 hours) at 05/12/2019 1120 Last data filed at 05/12/2019 0947 Gross per 24 hour  Intake 120 ml  Output 1680 ml  Net -1560 ml   Filed Weights   05/09/19 0908 05/09/19 2100 05/11/19 0418  Weight: 119 kg 118.6 kg 121.2 kg    Examination:  General exam: Appears calm and comfortable  Respiratory system: Clear to auscultation. Respiratory effort normal. Cardiovascular system: S1 & S2 heard, RRR. No JVD, murmurs, rubs, gallops or clicks. No pedal edema. Gastrointestinal system: Abdomen is nondistended, soft and nontender. No organomegaly or masses felt. Normal bowel sounds heard. Central nervous system: Alert and oriented. No focal neurological deficits. Extremities: Warm well perfused no edema Skin: No rashes, lesions or ulcers Psychiatry: Judgement and insight appear normal although appears to have limited cognitive deficit. Mood & affect appropriate.      Data Reviewed: I have personally reviewed following labs and imaging studies  CBC: Recent Labs  Lab 05/09/19 0911 05/10/19 0457 05/11/19 0444 05/12/19 0533  WBC 7.3 8.3 9.6 9.9  HGB 10.6* 10.4* 10.5* 10.7*  HCT 32.2* 32.7* 33.1* 33.5*  MCV 84.3 85.8 85.5 86.1  PLT 222 242 256 497   Basic Metabolic Panel: Recent Labs  Lab 05/09/19 0911 05/10/19 0457 05/11/19 0444 05/12/19 0533  NA 140 137 140 139  K 3.7 3.6 3.3* 3.2*  CL 108 105 107 105  CO2 20* 20* 22 21*  GLUCOSE 174* 218* 60* 73    BUN 46* 49* 49* 47*  CREATININE 2.25* 2.15* 1.90* 2.15*  CALCIUM 9.1 9.0 9.0 9.0  MG  --  2.0 2.1 2.2   GFR: Estimated Creatinine Clearance: 44 mL/min (A) (by C-G formula based on SCr of 2.15 mg/dL (H)). Liver Function Tests: Recent Labs  Lab 05/09/19 0911 05/10/19 0457 05/11/19 0444 05/12/19 0533  AST 25 19 19 18   ALT 18 17 17 18   ALKPHOS 60 53 49 55  BILITOT 0.6 0.5 0.6 0.5  PROT 6.7 6.2* 6.5 6.6  ALBUMIN 3.1* 2.8* 3.0* 3.1*   Recent Labs  Lab 05/09/19 0911  LIPASE 40   No results for input(s): AMMONIA in the last 168 hours. Coagulation Profile: Recent Labs  Lab 05/10/19 1640  INR 1.0   Cardiac Enzymes: No results for input(s): CKTOTAL, CKMB, CKMBINDEX, TROPONINI in the last 168 hours. BNP (last 3 results) No results for input(s): PROBNP in the last 8760 hours. HbA1C: Recent Labs    05/09/19 1351  HGBA1C 6.8*   CBG: Recent Labs  Lab 05/11/19 1314 05/11/19 1704 05/11/19 2045 05/12/19 0009 05/12/19 0746  GLUCAP 316* 233* 254* 170* 71   Lipid Profile: No results for input(s): CHOL, HDL, LDLCALC, TRIG, CHOLHDL, LDLDIRECT in the last 72 hours. Thyroid Function Tests: No results for input(s): TSH, T4TOTAL, FREET4, T3FREE, THYROIDAB in the last 72 hours. Anemia Panel: No results for input(s): VITAMINB12, FOLATE, FERRITIN, TIBC, IRON, RETICCTPCT in the last 72 hours. Sepsis Labs: No results for input(s): PROCALCITON, LATICACIDVEN in the last 168 hours.  No results found for this or any previous visit (from the past 240 hour(s)).       Radiology  Studies: Nm Pulmonary Perf And Vent  Result Date: 05/11/2019 CLINICAL DATA:  Shortness of breath and hemoptysis EXAM: NUCLEAR MEDICINE VENTILATION - PERFUSION LUNG SCAN VIEWS: Anterior, posterior, left lateral, right lateral, RPO, LPO, RAO, LAO-ventilation and perfusion RADIOPHARMACEUTICALS:  31.87 mCi of Tc-19m DTPA aerosol inhalation and 4.19 mCi Tc63m MAA IV COMPARISON:  Chest radiograph May 08, 2016  FINDINGS: Ventilation: Radiotracer uptake is homogeneous and symmetric bilaterally. No focal ventilation defects are evident. Perfusion: Radiotracer uptake is homogeneous and symmetric bilaterally. No perfusion defects evident. IMPRESSION: No appreciable ventilation or perfusion defects. Very low probability of pulmonary embolus. Electronically Signed   By: Lowella Grip III M.D.   On: 05/11/2019 14:32        Scheduled Meds:  amLODipine  10 mg Oral Daily   clarithromycin  500 mg Oral Q12H   ferrous sulfate  325 mg Oral BID WC   furosemide  80 mg Oral Daily   gemfibrozil  600 mg Oral BID WC   hydrALAZINE  100 mg Oral TID   insulin aspart  0-15 Units Subcutaneous TID WC   insulin aspart  0-5 Units Subcutaneous QHS   insulin aspart  2 Units Subcutaneous TID WC   insulin glargine  26 Units Subcutaneous QPM   ipratropium-albuterol  3 mL Nebulization TID   isosorbide mononitrate  30 mg Oral BID   metoprolol succinate  50 mg Oral QHS   mometasone-formoterol  2 puff Inhalation BID   pantoprazole (PROTONIX) IV  40 mg Intravenous Q12H   predniSONE  20 mg Oral Daily   Continuous Infusions:   LOS: 2 days    Time spent: 35 min    Nicolette Bang, MD [facility]  If 7PM-7AM, please contact night-coverage  05/12/2019, 11:20 AM

## 2019-05-13 DIAGNOSIS — I5032 Chronic diastolic (congestive) heart failure: Secondary | ICD-10-CM

## 2019-05-13 LAB — CBC
HCT: 32.9 % — ABNORMAL LOW (ref 39.0–52.0)
Hemoglobin: 10.5 g/dL — ABNORMAL LOW (ref 13.0–17.0)
MCH: 27.4 pg (ref 26.0–34.0)
MCHC: 31.9 g/dL (ref 30.0–36.0)
MCV: 85.9 fL (ref 80.0–100.0)
Platelets: 258 10*3/uL (ref 150–400)
RBC: 3.83 MIL/uL — ABNORMAL LOW (ref 4.22–5.81)
RDW: 16.9 % — ABNORMAL HIGH (ref 11.5–15.5)
WBC: 9.6 10*3/uL (ref 4.0–10.5)
nRBC: 0 % (ref 0.0–0.2)

## 2019-05-13 LAB — GLUCOSE, CAPILLARY
Glucose-Capillary: 190 mg/dL — ABNORMAL HIGH (ref 70–99)
Glucose-Capillary: 100 mg/dL — ABNORMAL HIGH (ref 70–99)
Glucose-Capillary: 134 mg/dL — ABNORMAL HIGH (ref 70–99)
Glucose-Capillary: 276 mg/dL — ABNORMAL HIGH (ref 70–99)

## 2019-05-13 LAB — MAGNESIUM: Magnesium: 2.3 mg/dL (ref 1.7–2.4)

## 2019-05-13 MED ORDER — CLARITHROMYCIN 500 MG PO TABS: 500.0000 mg | ORAL_TABLET | Freq: Two times a day (BID) | ORAL | 0 refills | Status: DC

## 2019-05-13 NOTE — Consult Note (Signed)
Pulmonary Critical Care  Initial Consult Note  Larry Callahan WUJ:[id number] DOB: [birth year] DOA: 05/09/2019  Referring physician: Dr. Jolee Ewing  Chief Complaint: Possible coughing up blood  HPI: Larry Callahan is a 63 y.o. male who is known to me from office.  Patient has multiple medical problems including chronic renal insufficiency morbid obesity obstructive sleep apnea came to the hospital because of the possibility of coughing up blood.  Patient states that he was not sure if the blood was coming from his stomach or if he was bringing it from his lungs.  He had a very extensive work-up done including a CT scan chest x-ray that was done which revealed no acute pulmonary disease.  I personally reviewed the CT scan myself.  The patient today states that he may have noted some blood coming out from his nose.  He states he has not really been coughing up any blood but has noticed that the blood is mixed in with some spit at times.  Spoke with the nurse that is taking care of him at this time she states that there has been no blood today.  He may have had some blood overnight however I do not have any tissues or a basin to look at.  Review of Systems:  Constitutional:  No weight loss, night sweats, Fevers, chills, fatigue.  HEENT:  No headaches, nasal congestion, post nasal drip,  Cardio-vascular:  No chest pain, Orthopnea, PND, swelling in lower extremities, anasarca, dizziness, palpitations  GI:  No heartburn, indigestion, abdominal pain, nausea, vomiting, diarrhea  Resp:  No shortness of breath with exertion or at rest. ?coughing up of blood.No wheezing Skin:  no rash or lesions.  Musculoskeletal:  No joint pain or swelling.   Remainder ROS performed and is unremarkable other than noted in HPI  Past Medical History:  Diagnosis Date  . Allergy   . Coronary artery disease   . Diabetes mellitus without complication ([place])   . Diastolic CHF ([place])   . GERD  (gastroesophageal reflux disease)   . Hyperlipidemia   . Hypertension   . Renal insufficiency   . Sleep apnea    Past Surgical History:  Procedure Laterality Date  . BACK SURGERY    . CHOLECYSTECTOMY    . COLONOSCOPY WITH PROPOFOL N/A 04/12/2019   Procedure: COLONOSCOPY WITH PROPOFOL;  Surgeon: [name], MD;  Location: [facility] ENDOSCOPY;  Service: Gastroenterology;  Laterality: N/A;  . ESOPHAGOGASTRODUODENOSCOPY N/A 04/12/2019   Procedure: ESOPHAGOGASTRODUODENOSCOPY (EGD);  Surgeon: [name], MD;  Location: [facility] ENDOSCOPY;  Service: Gastroenterology;  Laterality: N/A;  . left arm surgery    . nephrectomy Left   . PARATHYROIDECTOMY    . RIGHT HEART CATH N/A 03/29/2019   Procedure: RIGHT HEART CATH;  Surgeon: [name], MD;  Location: [place] [facility];  Service: Cardiovascular;  Laterality: N/A;   Social History:  reports that he has quit smoking. He has quit using smokeless tobacco.  His smokeless tobacco use included chew. He reports that he does not drink alcohol or use drugs.  Allergies  Allergen Reactions  . Penicillins Rash    Family History  Problem Relation Age of Onset  . Diabetes Mother   . Lung cancer Father   . Diabetes Sister   . Hypertension Sister   . Heart disease Sister   . Cancer Sister   . Bone cancer Brother     Prior to Admission medications   Medication Sig Start Date End Date Taking?  Authorizing Provider  acetaminophen (TYLENOL) 325 MG tablet Take 2 tablets (650 mg total) by mouth every 4 (four) hours as needed for headache or mild pain. 03/05/19  Yes Gouru, Illene Silver, MD  albuterol (VENTOLIN HFA) 108 (90 Base) MCG/ACT inhaler Inhale 2 puffs into the lungs every 6 (six) hours as needed for wheezing or shortness of breath. 03/05/19  Yes Gouru, Illene Silver, MD  amLODipine (NORVASC) 10 MG tablet Take 1 tablet (10 mg total) by mouth daily. 03/12/19  Yes [name], MD  aspirin EC 81 MG tablet Take 81 mg by mouth daily.   Yes  [provider]  Blood Glucose Calibration (TRUE METRIX LEVEL 1) Low SOLN Use as directed 04/04/18  Yes Boscia, Heather E, NP  Blood Glucose Monitoring Suppl (TRUE METRIX AIR GLUCOSE METER) DEVI 1 Device by Does not apply route 2 (two) times daily. 04/13/18  Yes Boscia, Greer Ee, NP  clobetasol cream (TEMOVATE) 7.85 % Apply 1 application topically 2 (two) times daily. 12/31/18  Yes [name], NP  ferrous sulfate 325 (65 FE) MG EC tablet Take 1 tablet (325 mg total) by mouth 2 (two) times daily with a meal. 04/13/19 05/13/19 Yes Sudini, Srikar, MD  Fluticasone-Salmeterol (ADVAIR DISKUS) 250-50 MCG/DOSE AEPB Inhale 1 puff into the lungs 2 (two) times daily. 03/05/19 03/04/20 Yes Gouru, Illene Silver, MD  furosemide (LASIX) 80 MG tablet Take 1 tablet (80 mg total) by mouth daily. 04/13/19  Yes Hillary Bow, MD  gemfibrozil (LOPID) 600 MG tablet Take 1 tablet (600 mg total) by mouth 2 (two) times daily with a meal. 02/28/19  Yes Boscia, Heather E, NP  glimepiride (AMARYL) 2 MG tablet Take 1 tablet (2 mg total) by mouth daily before breakfast. 03/13/19  Yes Boscia, Heather E, NP  glucose blood (TRUE METRIX BLOOD GLUCOSE TEST) test strip Use as instructed twice daily diag E11.65 11/19/18  Yes Boscia, Heather E, NP  hydrALAZINE (APRESOLINE) 100 MG tablet Take 1 tablet (100 mg total) by mouth 3 (three) times daily. 03/31/19  Yes Gouru, Aruna, MD  Insulin Glargine, 1 Unit Dial, (TOUJEO SOLOSTAR) 300 UNIT/ML SOPN Inject 26 Units into the skin every evening. 03/13/19 06/11/19 Yes Boscia, Heather E, NP  ipratropium-albuterol (DUONEB) 0.5-2.5 (3) MG/3ML SOLN Take 3 mLs by nebulization every 6 (six) hours as needed. 03/08/19  Yes Scarboro, Audie Clear, NP  isosorbide mononitrate (IMDUR) 30 MG 24 hr tablet Take 1 tablet (30 mg total) by mouth 2 (two) times daily. 04/13/19  Yes Sudini, Alveta Heimlich, MD  loratadine (CLARITIN) 10 MG tablet Take 1 tablet (10 mg total) by mouth daily. 04/01/19  Yes Gouru, Illene Silver, MD  metoprolol  succinate (TOPROL-XL) 50 MG 24 hr tablet Take 1 tablet (50 mg total) by mouth at bedtime. Take with or immediately following a meal. 03/12/19  Yes Gollan, Kathlene November, MD  nicotine (NICODERM CQ - DOSED IN MG/24 HOURS) 21 mg/24hr patch Place 1 patch (21 mg total) onto the skin daily. 12/31/18  Yes [name], NP  NOVOFINE 32G X 6 MM MISC To use with Baxter injections daily 12/13/18  Yes Boscia, Heather E, NP  omeprazole (PRILOSEC) 40 MG capsule Take 1 capsule (40 mg total) by mouth daily. 03/01/19  Yes Scarboro, Audie Clear, NP  predniSONE (DELTASONE) 20 MG tablet Take 1 tablet (20 mg total) by mouth daily. 04/29/19  Yes Caren Griffins, MD  tiotropium (SPIRIVA HANDIHALER) 18 MCG inhalation capsule Place 1 capsule (18 mcg total) into inhaler and inhale daily. 04/13/19 05/13/19 Yes Sudini,  Srikar, MD  triamcinolone ointment (KENALOG) 0.1 % Apply 1 application topically 2 (two) times daily. 11/23/18  Yes [name], NP  TRUEPLUS LANCETS 33G MISC Use as directed twice daily diag E11.65 04/04/18  Yes [name], NP  ferrous gluconate (FERGON) 324 MG tablet Take 1 tablet (324 mg total) by mouth daily with breakfast. Patient not taking: Reported on 04/24/2019 12/31/18   [name], NP   Physical Exam: Vitals:   05/13/19 0404 05/13/19 0850 05/13/19 1216 05/13/19 1511  BP: (!) 141/63 140/71 (!) 156/91 (!) 155/78  Pulse: 66 66 68   Resp: 20  18   Temp: 98.3 F (36.8 C)  98.2 F (36.8 C)   TempSrc: Oral  Oral   SpO2: 96%  96%   Weight: 121.3 kg     Height:        Wt Readings from Last 3 Encounters:  05/13/19 121.3 kg  04/30/19 119.8 kg  04/13/19 117.5 kg    General:  Appears calm and comfortable Eyes: PERRL, normal lids, irises & conjunctiva ENT: grossly normal hearing, lips & tongue Neck: no LAD, masses or thyromegaly Cardiovascular: RRR, no m/r/g. No LE edema. Respiratory: CTA bilaterally, no w/r/r.       Normal respiratory effort. Abdomen: soft, nontender Skin: no rash or  induration seen on limited exam Musculoskeletal: grossly normal tone BUE/BLE Psychiatric: grossly normal mood and affect Neurologic: grossly non-focal.          Labs on Admission:  Basic Metabolic Panel: Recent Labs  Lab 05/09/19 0911 05/10/19 0457 05/11/19 0444 05/12/19 0533 05/13/19 0542  NA 140 137 140 139  --   K 3.7 3.6 3.3* 3.2*  --   CL 108 105 107 105  --   CO2 20* 20* 22 21*  --   GLUCOSE 174* 218* 60* 73  --   BUN 46* 49* 49* 47*  --   CREATININE 2.25* 2.15* 1.90* 2.15*  --   CALCIUM 9.1 9.0 9.0 9.0  --   MG  --  2.0 2.1 2.2 2.3   Liver Function Tests: Recent Labs  Lab 05/09/19 0911 05/10/19 0457 05/11/19 0444 05/12/19 0533  AST 25 19 19 18   ALT 18 17 17 18   ALKPHOS 60 53 49 55  BILITOT 0.6 0.5 0.6 0.5  PROT 6.7 6.2* 6.5 6.6  ALBUMIN 3.1* 2.8* 3.0* 3.1*   Recent Labs  Lab 05/09/19 0911  LIPASE 40   No results for input(s): AMMONIA in the last 168 hours. CBC: Recent Labs  Lab 05/09/19 0911 05/10/19 0457 05/11/19 0444 05/12/19 0533 05/13/19 0542  WBC 7.3 8.3 9.6 9.9 9.6  HGB 10.6* 10.4* 10.5* 10.7* 10.5*  HCT 32.2* 32.7* 33.1* 33.5* 32.9*  MCV 84.3 85.8 85.5 86.1 85.9  PLT 222 242 256 262 258   Cardiac Enzymes: No results for input(s): CKTOTAL, CKMB, CKMBINDEX, TROPONINI in the last 168 hours.  BNP (last 3 results) Recent Labs    04/09/19 1432 04/24/19 1122 05/10/19 1640  BNP 115.0* 88.0 75.0    ProBNP (last 3 results) No results for input(s): PROBNP in the last 8760 hours.  CBG: Recent Labs  Lab 05/12/19 1633 05/12/19 2308 05/13/19 0404 05/13/19 0736 05/13/19 1216  GLUCAP 268* 202* 190* 100* 134*    Radiological Exams on Admission: No results found.  EKG: Independently reviewed.  Assessment/Plan Principal Problem:   Hemoptysis Active Problems:   Morbid (severe) obesity due to excess calories (HCC)   Type 2 diabetes mellitus with diabetic  polyneuropathy ([place])   Mixed hyperlipidemia   Chronic obstructive  pulmonary disease, unspecified (HCC)   Essential hypertension   CHF (congestive heart failure) ([place])   AKI (acute kidney injury) ([place])   1. Possible hemoptysis.  At best then mopped assist is scant the patient has a negative CT scan and also has a negative chest x-ray.  Patient did undergo a VQ scan which did not show any VQ mismatch is essentially was unremarkable for pulmonary embolism.  The most common cause of hemoptysis is still infection and medication related.  The patient is apparently on 81 mg of aspirin we could consider discontinuing that.  He was started on clarithromycin for possibility of infection.  The only issue is the possibility that this blood could be coming from the upper respiratory tract such as the nose and today in fact he does volunteer that he was having a little bit of blood coming from his nose.  I would recommend doing a upper respiratory tract evaluation by ENT first.  I think that the bleeding should resolve if it does not then we could do a bronchoscopy.  If the patient desires he could be discharged and all of this work-up could be done as an outpatient 2. Chronic congestive heart failure this is under control we will continue with supportive care 3. 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 4. Acute renal failure patient is status post nephrectomy we will continue with present management fluid monitoring. 5. Obstructive sleep apnea appears to be under control  Code Status: Full code  Family Communication: No family was present in the room Disposition Plan: Home  Time spent: 70 minutes  I have personally obtained a history, examined the patient, evaluated laboratory and imaging results, formulated the assessment and plan and placed orders.  The Patient requires high complexity decision making for assessment and support. Total Time Spent 58min   Saadat A Khan, MD [facility] Pulmonary Critical Care Medicine Sleep Medicine

## 2019-05-13 NOTE — Discharge Summary (Signed)
Physician Discharge Summary  Larry Callahan Larry Callahan:[id number] DOB: 23-Apr-1956 DOA: 05/09/2019  PCP: Larry Freshwater, NP  Admit date: 05/09/2019 Discharge date: 05/13/2019  Admitted From: Inpatient Disposition: home  Recommendations for Outpatient Follow-up:  1. Follow up with PCP in 1-2 weeks 2    follow-up with ENT as discussed  Home Health:No Equipment/Devices:no new equipment  Discharge Condition:Stable CODE STATUS:Full code Diet recommendation: resume pre-admission diet  Brief/Interim Summary: Per admitting physician: Larry Callahan a 63 y.o.malewith medical history significant ofCAD, COPD, essential hypertension, obesity, iron deficiency anemia presented to the hospital with complaints of hemoptysis. Patient states over the course the last several days he has had few episodes of hemoptysis/blood in his spit but denies much of coughing, fevers chills or other complaints. But this morning he had couple of episodes which were little bit more than usual therefore came to the hospital. In the ER he had couple of small episodes in a tissue as well. CT of the chest without contrast was negative. Was in mild AKI therefore did not receive contrast. Baseline creatinine 1.8, admission creatinine 2.2. He was admitted for overnight observation.  Admitted here about 2 weeks ago for shortness of breath secondary to pulmonary edema requiring diuresis. Cardiology had seen the patient at that time.  Had endoscopy and colonoscopy about a month ago which were both negative for any acute pathology.   Hospital course Recurrent hemoptysis.  Patient reported coughing up blood multiple times.  He was seen by the intensivist over the weekend with no indication for acute bronchoscopy.  I discussed it further with his primary pulmonologist today who saw him in consultation.  He did not feel there was an acute process that require bronchoscopy.  Recommend discharge and outpatient  follow-up with him for possible bronchoscopy if it reoccurs.  Hemoglobin is remained stable during the hospital stay as has patient hemodynamically.  Acute kidney injury improved with gentle hydration during hospital stay no further work-up was needed.  COPD/hypertension/CAD/diabetes mellitus type 2. To new patient's home medications he was not in acute exacerbation blood pressure was reasonable controlled, he had no anginal symptomatology.  We placed him on sliding scale insulin check and blood glucose AC at bedtime.  He will follow-up as outpatient provider for continued surveillance of blood pressure, A1c, lipids.  Discharge Diagnoses:  Principal Problem:   Hemoptysis Active Problems:   Morbid (severe) obesity due to excess calories (HCC)   Type 2 diabetes mellitus with diabetic polyneuropathy (HCC)   Mixed hyperlipidemia   Chronic obstructive pulmonary disease, unspecified (HCC)   Essential hypertension   CHF (congestive heart failure) (HCC)   AKI (acute kidney injury) [facility])    Discharge Instructions  Discharge Instructions    Call MD for:  difficulty breathing, headache or visual disturbances   Complete by: As directed    Call MD for:  extreme fatigue   Complete by: As directed    Call MD for:  hives   Complete by: As directed    Call MD for:  persistant dizziness or light-headedness   Complete by: As directed    Call MD for:  persistant nausea and vomiting   Complete by: As directed    Call MD for:  severe uncontrolled pain   Complete by: As directed    Call MD for:  temperature >100.4   Complete by: As directed    Diet - low sodium heart healthy   Complete by: As directed    Increase activity slowly   Complete  by: As directed      Allergies as of 05/13/2019      Reactions   Penicillins Rash      Medication List    TAKE these medications   acetaminophen 325 MG tablet Commonly known as: TYLENOL Take 2 tablets (650 mg total) by mouth every 4 (four) hours as  needed for headache or mild pain.   albuterol 108 (90 Base) MCG/ACT inhaler Commonly known as: VENTOLIN HFA Inhale 2 puffs into the lungs every 6 (six) hours as needed for wheezing or shortness of breath.   amLODipine 10 MG tablet Commonly known as: NORVASC Take 1 tablet (10 mg total) by mouth daily.   aspirin EC 81 MG tablet Take 81 mg by mouth daily.   clarithromycin 500 MG tablet Commonly known as: BIAXIN Take 1 tablet (500 mg total) by mouth every 12 (twelve) hours for 7 days.   clobetasol cream 0.05 % Commonly known as: TEMOVATE Apply 1 application topically 2 (two) times daily.   ferrous gluconate 324 MG tablet Commonly known as: FERGON Take 1 tablet (324 mg total) by mouth daily with breakfast.   ferrous sulfate 325 (65 FE) MG EC tablet Take 1 tablet (325 mg total) by mouth 2 (two) times daily with a meal.   Fluticasone-Salmeterol 250-50 MCG/DOSE Aepb Commonly known as: Advair Diskus Inhale 1 puff into the lungs 2 (two) times daily.   furosemide 80 MG tablet Commonly known as: Lasix Take 1 tablet (80 mg total) by mouth daily.   gemfibrozil 600 MG tablet Commonly known as: LOPID Take 1 tablet (600 mg total) by mouth 2 (two) times daily with a meal.   glimepiride 2 MG tablet Commonly known as: AMARYL Take 1 tablet (2 mg total) by mouth daily before breakfast.   glucose blood test strip Commonly known as: True Metrix Blood Glucose Test Use as instructed twice daily diag E11.65   hydrALAZINE 100 MG tablet Commonly known as: APRESOLINE Take 1 tablet (100 mg total) by mouth 3 (three) times daily.   ipratropium-albuterol 0.5-2.5 (3) MG/3ML Soln Commonly known as: DUONEB Take 3 mLs by nebulization every 6 (six) hours as needed.   isosorbide mononitrate 30 MG 24 hr tablet Commonly known as: IMDUR Take 1 tablet (30 mg total) by mouth 2 (two) times daily.   loratadine 10 MG tablet Commonly known as: CLARITIN Take 1 tablet (10 mg total) by mouth daily.    metoprolol succinate 50 MG 24 hr tablet Commonly known as: TOPROL-XL Take 1 tablet (50 mg total) by mouth at bedtime. Take with or immediately following a meal.   nicotine 21 mg/24hr patch Commonly known as: NICODERM CQ - dosed in mg/24 hours Place 1 patch (21 mg total) onto the skin daily.   NovoFine 32G X 6 MM Misc Generic drug: Insulin Pen Needle To use with [place] injections daily   omeprazole 40 MG capsule Commonly known as: PRILOSEC Take 1 capsule (40 mg total) by mouth daily.   predniSONE 20 MG tablet Commonly known as: DELTASONE Take 1 tablet (20 mg total) by mouth daily.   Spiriva HandiHaler 18 MCG inhalation capsule Generic drug: tiotropium Place 1 capsule (18 mcg total) into inhaler and inhale daily.   Toujeo SoloStar 300 UNIT/ML Sopn Generic drug: Insulin Glargine (1 Unit Dial) Inject 26 Units into the skin every evening.   triamcinolone ointment 0.1 % Commonly known as: KENALOG Apply 1 application topically 2 (two) times daily.   True Metrix Air Glucose Meter Devi 1 Device by  Does not apply route 2 (two) times daily.   True Metrix Level 1 Low Soln Use as directed   TRUEplus Lancets 33G Misc Use as directed twice daily diag E11.65      Follow-up Information    Schedule an appointment as soon as possible for a visit  with Larry Freshwater, NP.   Specialty: Family Medicine Contact information: [address] [place] [address] [phone]          Allergies  Allergen Reactions  . Penicillins Rash    Consultations:  Intensivist, pulmonology   Procedures/Studies: Dg Chest 2 View  Result Date: 05/09/2019 CLINICAL DATA:  Spitting up blood with lower extremity edema and generalized chest and upper abdominal pain since this morning. EXAM: CHEST - 2 VIEW COMPARISON:  04/24/2019 FINDINGS: Lungs are adequately inflated and otherwise clear. Cardiomediastinal silhouette and remainder of the exam is unchanged. IMPRESSION: No active  cardiopulmonary disease. Electronically Signed   By: Marin Olp M.D.   On: 05/09/2019 09:48   Dg Chest 2 View  Result Date: 04/24/2019 CLINICAL DATA:  complaints of shortness of breath, fatigue since this morning. Pt here for same recently, admission due to CHF. HTN, CHF, DM, former smoker EXAM: CHEST - 2 VIEW COMPARISON:  04/10/2019 FINDINGS: Mild central pulmonary vascular congestion and mild diffuse interstitial prominent, increased since previous. Heart size upper limits normal. Aortic Atherosclerosis (ICD10-170.0). No effusion. Anterior vertebral endplate spurring at multiple levels in the lower thoracic spine. IMPRESSION: Worsening central pulmonary vascular congestion and interstitial edema. Electronically Signed   By: Lucrezia Europe M.D.   On: 04/24/2019 12:25   Ct Head Wo Contrast  Result Date: 04/29/2019 CLINICAL DATA:  Syncopal episode today with trauma to the right side of the head EXAM: CT HEAD WITHOUT CONTRAST TECHNIQUE: Contiguous axial images were obtained from the base of the skull through the vertex without intravenous contrast. COMPARISON:  03/26/2019 FINDINGS: Brain: The brain shows a normal appearance without evidence of malformation, atrophy, old or acute small or large vessel infarction, mass lesion, hemorrhage, hydrocephalus or extra-axial collection. Vascular: No hyperdense vessel. No evidence of atherosclerotic calcification. Skull: Normal.  No traumatic finding.  No focal bone lesion. Sinuses/Orbits: Sinuses are clear. Orbits appear normal. Mastoids are clear. Other: None significant IMPRESSION: Normal head CT Electronically Signed   By: Nelson Chimes M.D.   On: 04/29/2019 16:59   Ct Chest Wo Contrast  Result Date: 05/09/2019 CLINICAL DATA:  Nonproductive cough, hemoptysis, bilateral leg swelling EXAM: CT CHEST WITHOUT CONTRAST TECHNIQUE: Multidetector CT imaging of the chest was performed following the standard protocol without IV contrast. COMPARISON:  Chest radiograph dated  05/09/2019. CTA chest dated 02/20/2019. FINDINGS: Cardiovascular: The heart is normal in size. No pericardial effusion. No evidence of thoracic aortic aneurysm. Atherosclerotic calcifications of the aortic arch. Coronary atherosclerosis of the LAD. Mediastinum/Nodes: No suspicious mediastinal lymphadenopathy. Visualized thyroid is unremarkable. Lungs/Pleura: No suspicious pulmonary nodules. No focal consolidation. Mild centrilobular emphysematous changes in the bilateral upper lobes. No pleural effusion or pneumothorax. Upper Abdomen: Visualized upper abdomen is notable for bilateral renal cysts measuring up to 7.5 cm in the left upper kidney, incompletely visualized. Musculoskeletal: Degenerative changes of the visualized thoracolumbar spine. IMPRESSION: No evidence of acute cardiopulmonary disease. Aortic Atherosclerosis (ICD10-I70.0) and Emphysema (ICD10-J43.9). Electronically Signed   By: Julian Hy M.D.   On: 05/09/2019 11:50   Nm Pulmonary Perf And Vent  Result Date: 05/11/2019 CLINICAL DATA:  Shortness of breath and hemoptysis EXAM: NUCLEAR MEDICINE VENTILATION - PERFUSION LUNG SCAN  VIEWS: Anterior, posterior, left lateral, right lateral, RPO, LPO, RAO, LAO-ventilation and perfusion RADIOPHARMACEUTICALS:  31.87 mCi of Tc-40m DTPA aerosol inhalation and 4.19 mCi Tc30m MAA IV COMPARISON:  Chest radiograph May 08, 2016 FINDINGS: Ventilation: Radiotracer uptake is homogeneous and symmetric bilaterally. No focal ventilation defects are evident. Perfusion: Radiotracer uptake is homogeneous and symmetric bilaterally. No perfusion defects evident. IMPRESSION: No appreciable ventilation or perfusion defects. Very low probability of pulmonary embolus. Electronically Signed   By: Lowella Grip III M.D.   On: 05/11/2019 14:32   Ct Renal Stone Study  Result Date: 04/24/2019 CLINICAL DATA:  Left flank pain. Generalized abdominal pain. Bloody stools. EXAM: CT ABDOMEN AND PELVIS WITHOUT CONTRAST  TECHNIQUE: Multidetector CT imaging of the abdomen and pelvis was performed following the standard protocol without IV contrast. COMPARISON:  01/31/2014 CT abdomen/pelvis. FINDINGS: Lower chest: No significant pulmonary nodules or acute consolidative airspace disease. Hepatobiliary: Normal liver size. No liver mass. Cholecystectomy. No biliary ductal dilatation. Pancreas: Normal, with no mass or duct dilation. Spleen: Normal size spleen. Granulomatous calcifications throughout the spleen. No splenic mass. Adrenals/Urinary Tract: Normal right adrenal. 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 Left nephrectomy with no mass or fluid collection in the left nephrectomy bed. No right hydronephrosis. No right renal stones. Multiple simple right renal cysts, largest 7.7 cm in the posterior lower right kidney. Normal caliber right ureter. No right ureteral stones. Normal bladder with no bladder stones. Stomach/Bowel: Normal non-distended stomach. Normal caliber small bowel with no small bowel wall thickening. Normal appendix. Marked left colonic diverticulosis, with no large bowel wall thickening or significant pericolonic fat stranding. Vascular/Lymphatic: Atherosclerotic nonaneurysmal abdominal aorta. No pathologically enlarged lymph nodes in the abdomen or pelvis. Reproductive: Mildly enlarged prostate. Other: No pneumoperitoneum, ascites or focal fluid collection. Moderate to large fat containing umbilical hernia, slightly increased from prior. Chronic diastasis of posterior left lateral abdominal wall. Musculoskeletal: No aggressive appearing focal osseous lesions. Moderate thoracolumbar spondylosis. IMPRESSION: 1. No acute abnormality. No evidence of bowel obstruction or acute bowel inflammation. Marked sigmoid diverticulosis, with no evidence of acute diverticulitis. 2. No right hydronephrosis.  No  urolithiasis. 3. 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 Given the large size and continued growth, surgical consultation may be considered. 4. Left nephrectomy. 5. Mild prostatomegaly. 6. Moderate to large fat containing umbilical hernia, slightly increased. 7.  Aortic Atherosclerosis (ICD10-I70.0). Electronically Signed   By: Ilona Sorrel M.D.   On: 04/24/2019 16:17   Dg Hip Unilat With Pelvis 2-3 Views Left  Result Date: 04/29/2019 CLINICAL DATA:  Fall EXAM: DG HIP (WITH OR WITHOUT PELVIS) 2-3V LEFT COMPARISON:  None. FINDINGS: Anatomic alignment is maintained. There is no acute fracture. Mild changes of osteoarthritis. IMPRESSION: No acute fracture. Electronically Signed   By: Macy Mis M.D.   On: 04/29/2019 12:06   Dg Hip Unilat With Pelvis 2-3 Views Right  Result Date: 04/29/2019 CLINICAL DATA:  Fall EXAM: DG HIP (WITH OR WITHOUT PELVIS) 2-3V RIGHT COMPARISON:  2011 FINDINGS: Alignment is anatomic. There is no acute fracture. Changes of osteoarthritis are present similar to 2011. IMPRESSION: No acute fracture at the right hip. Electronically Signed   By: Macy Mis M.D.   On: 04/29/2019 12:03       Subjective: Patient stable Wanted to go home  Discharge  Exam: Vitals:   05/13/19 1216 05/13/19 1511  BP: (!) 156/91 (!) 155/78  Pulse: 68   Resp: 18   Temp: 98.2 F (36.8 C)   SpO2: 96%    Vitals:   05/13/19 0404 05/13/19 0850 05/13/19 1216 05/13/19 1511  BP: (!) 141/63 140/71 (!) 156/91 (!) 155/78  Pulse: 66 66 68   Resp: 20  18   Temp: 98.3 F (36.8 C)  98.2 F (36.8 C)   TempSrc: Oral  Oral   SpO2: 96%  96%   Weight: 121.3 kg     Height:        General: Pt is alert, awake, not in acute distress Cardiovascular: RRR, S1/S2 +, no rubs, no gallops Respiratory: CTA bilaterally, no wheezing, no rhonchi Abdominal: Soft, NT, ND, bowel sounds + Extremities: no edema, no  cyanosis    The results of significant diagnostics from this hospitalization (including imaging, microbiology, ancillary and laboratory) are listed below for reference.     Microbiology: No results found for this or any previous visit (from the past 240 hour(s)).   Labs: BNP (last 3 results) Recent Labs    04/09/19 1432 04/24/19 1122 05/10/19 1640  BNP 115.0* 88.0 93.7   Basic Metabolic Panel: Recent Labs  Lab 05/09/19 0911 05/10/19 0457 05/11/19 0444 05/12/19 0533 05/13/19 0542  NA 140 137 140 139  --   K 3.7 3.6 3.3* 3.2*  --   CL 108 105 107 105  --   CO2 20* 20* 22 21*  --   GLUCOSE 174* 218* 60* 73  --   BUN 46* 49* 49* 47*  --   CREATININE 2.25* 2.15* 1.90* 2.15*  --   CALCIUM 9.1 9.0 9.0 9.0  --   MG  --  2.0 2.1 2.2 2.3   Liver Function Tests: Recent Labs  Lab 05/09/19 0911 05/10/19 0457 05/11/19 0444 05/12/19 0533  AST 25 19 19 18   ALT 18 17 17 18   ALKPHOS 60 53 49 55  BILITOT 0.6 0.5 0.6 0.5  PROT 6.7 6.2* 6.5 6.6  ALBUMIN 3.1* 2.8* 3.0* 3.1*   Recent Labs  Lab 05/09/19 0911  LIPASE 40   No results for input(s): AMMONIA in the last 168 hours. CBC: Recent Labs  Lab 05/09/19 0911 05/10/19 0457 05/11/19 0444 05/12/19 0533 05/13/19 0542  WBC 7.3 8.3 9.6 9.9 9.6  HGB 10.6* 10.4* 10.5* 10.7* 10.5*  HCT 32.2* 32.7* 33.1* 33.5* 32.9*  MCV 84.3 85.8 85.5 86.1 85.9  PLT 222 242 256 262 258   Cardiac Enzymes: No results for input(s): CKTOTAL, CKMB, CKMBINDEX, TROPONINI in the last 168 hours. BNP: Invalid input(s): POCBNP CBG: Recent Labs  Lab 05/12/19 1633 05/12/19 2308 05/13/19 0404 05/13/19 0736 05/13/19 1216  GLUCAP 268* 202* 190* 100* 134*   D-Dimer No results for input(s): DDIMER in the last 72 hours. Hgb A1c No results for input(s): HGBA1C in the last 72 hours. Lipid Profile No results for input(s): CHOL, HDL, LDLCALC, TRIG, CHOLHDL, LDLDIRECT in the last 72 hours. Thyroid function studies No results for input(s): TSH,  T4TOTAL, T3FREE, THYROIDAB in the last 72 hours.  Invalid input(s): FREET3 Anemia work up No results for input(s): VITAMINB12, FOLATE, FERRITIN, TIBC, IRON, RETICCTPCT in the last 72 hours. Urinalysis    Component Value Date/Time   COLORURINE YELLOW (A) 04/23/2019 1852   APPEARANCEUR CLEAR (A) 04/23/2019 1852   APPEARANCEUR Cloudy (A) 11/23/2018 1103   LABSPEC 1.011 04/23/2019 1852   LABSPEC 1.005 03/06/2013 9024  PHURINE 5.0 04/23/2019 1852   GLUCOSEU NEGATIVE 04/23/2019 1852   GLUCOSEU Negative 03/06/2013 0929   HGBUR NEGATIVE 04/23/2019 [place] 04/23/2019 1852   BILIRUBINUR negative 02/20/2019 1404   BILIRUBINUR Negative 11/23/2018 1103   BILIRUBINUR Negative 03/06/2013 0929   KETONESUR NEGATIVE 04/23/2019 1852   PROTEINUR 100 (A) 04/23/2019 1852   UROBILINOGEN 0.2 02/20/2019 1404   NITRITE NEGATIVE 04/23/2019 1852   LEUKOCYTESUR NEGATIVE 04/23/2019 1852   LEUKOCYTESUR Negative 03/06/2013 0929   Sepsis Labs Invalid input(s): PROCALCITONIN,  WBC,  LACTICIDVEN Microbiology No results found for this or any previous visit (from the past 240 hour(s)).   Time coordinating discharge: Over 30 minutes  SIGNED:   Nicolette Bang, MD  [facility] 05/13/2019, 4:28 PM Pager   If 7PM-7AM, please contact night-coverage [email or web address] Password [email or web address]

## 2019-05-13 NOTE — Plan of Care (Signed)
  Problem: Education: Goal: Knowledge of General Education information will improve Description: Including pain rating scale, medication(s)/side effects and non-pharmacologic comfort measures Outcome: Progressing   Problem: Safety: Goal: Ability to remain free from injury will improve Outcome: Progressing   

## 2019-05-13 NOTE — Care Management Important Message (Signed)
Important Message  Patient Details  Name: Larry Callahan MRN: [id number] Date of Birth: 19-Nov-1955   Medicare Important Message Given:  Yes     Juliann Pulse A Lurleen Soltero 05/13/2019, 2:01 PM

## 2019-05-13 NOTE — Progress Notes (Signed)
Inpatient Diabetes Program Recommendations  AACE/ADA: New Consensus Statement on Inpatient Glycemic Control (2015)  Target Ranges:  Prepandial:   less than 140 mg/dL      Peak postprandial:   less than 180 mg/dL (1-2 hours)      Critically ill patients:  140 - 180 mg/dL   Results for DASEAN, BROW (MRN [id number]) as of 05/13/2019 11:01  Ref. Range 05/12/2019 00:09 05/12/2019 07:46 05/12/2019 11:21 05/12/2019 16:33  Glucose-Capillary Latest Ref Range: 70 - 99 mg/dL 170 (H) 71 367 (H)  17 units NOVOLOG  268 (H)  10 units NOVOLOG    Results for ALAND, CHESTNUTT (MRN [id number]) as of 05/13/2019 11:01  Ref. Range 05/12/2019 23:08 05/13/2019 04:04 05/13/2019 07:36  Glucose-Capillary Latest Ref Range: 70 - 99 mg/dL 202 (H)  2 units NOVOLOG  190 (H) 100 (H)    Admit with: Recurrent hemoptysis  History: DM  Home DM Meds: Toujeo 26 units QPM       Amaryl 2 mg Daily  Current Orders: Lantus 26 units QPM      Novolog Moderate Correction Scale/ SSI (0-15 units) TID AC + HS      Novolog 2 units TID with meals     Getting Prednisone 20 mg Daily.    MD- Please note that Lantus was NOT given last PM--Unsure why dose was held??  CBG was 71 mg/dl yesterday AM and CBG only 100 mg/dl without Lantus on board from last PM.  Patient is eating 80-100% of meals per documentation--Afternoon CBGs quite high yesterday possibly due to the Prednisone.  Please consider the following:  1. Reduce Lantus to 20 units QHS  2. Increase Novolog Meal Coverage to: Novolog 4 units TID with meals     --Will follow patient during hospitalization--  Wyn Quaker RN, MSN, CDE Diabetes Coordinator Inpatient Glycemic Control Team Team Pager: [phone] (8a-5p)

## 2019-05-14 ENCOUNTER — Telehealth: Payer: Self-pay

## 2019-05-14 NOTE — Telephone Encounter (Signed)
-----   Message from Darl Householder, [place] sent at 04/23/2019  3:48 PM EST ----- Regarding: 6 month repeat colonoscopy Please call and schedule pt for repeat 6 month ( April 2021) colonoscopy, poor bowel prep please do 2-day prep.

## 2019-05-14 NOTE — Discharge Summary (Signed)
[facility] [place] NAME: Larry Callahan    MR#:  [id number]  DATE OF BIRTH:  [birth year]  DATE OF ADMISSION:  04/10/2019   ADMITTING PHYSICIAN: Hillary Bow, MD  DATE OF DISCHARGE: 04/13/2019  2:12 PM  PRIMARY CARE PHYSICIAN: [name], NP   ADMISSION DIAGNOSIS:  SOB (shortness of breath) [R06.02] Acute on chronic congestive heart failure, unspecified heart failure type ([place]) [I50.9] Shortness of breath [R06.02] DISCHARGE DIAGNOSIS:  Principal Problem:   Hemoptysis Active Problems:   Morbid (severe) obesity due to excess calories (HCC)   Chronic obstructive pulmonary disease, unspecified (HCC)   Dyspnea on exertion   Type 2 diabetes mellitus with hyperglycemia (HCC)   Shortness of breath  SECONDARY DIAGNOSIS:   Past Medical History:  Diagnosis Date  . Allergy   . Coronary artery disease   . Diabetes mellitus without complication ([place])   . Diastolic CHF ([place])   . GERD (gastroesophageal reflux disease)   . Hyperlipidemia   . Hypertension   . Renal insufficiency   . Sleep apnea    HOSPITAL COURSE:   * Acute COPD exacerbation - improved with steroids, nebs  * Acute on chronic diastolic CHF - diuresed with Lasix  * Hemoptysis Due to copd. Resolved   *Iron deficiency anemia with intermittent melena EGD showed erosive esophagitis and gastritis. Patient had a poor bowel prep on inpatient colonoscopy. GI offered him repeat colonoscopy next day while inpatient. He preferred to undergo as outpatient only as he wanted to eat.  GI recommended outpatient colonoscopy with 2-day prep within next 6 months - Received 2 dose of IV iron in the hospital  *CKD stage III.  *Diabetes mellitus type 2  * Obesity with a BMI of 36.90. Weight loss counseled DISCHARGE CONDITIONS:  stable CONSULTS OBTAINED:   DRUG ALLERGIES:   Allergies  Allergen Reactions  . Penicillins Rash   DISCHARGE MEDICATIONS:   Allergies as of 04/13/2019       Reactions   Penicillins       Medication List    TAKE these medications   acetaminophen 325 MG tablet Commonly known as: TYLENOL Take 2 tablets (650 mg total) by mouth every 4 (four) hours as needed for headache or mild pain.   albuterol 108 (90 Base) MCG/ACT inhaler Commonly known as: VENTOLIN HFA Inhale 2 puffs into the lungs every 6 (six) hours as needed for wheezing or shortness of breath.   amLODipine 10 MG tablet Commonly known as: NORVASC Take 1 tablet (10 mg total) by mouth daily.   aspirin EC 81 MG tablet Take 81 mg by mouth daily.   clobetasol cream 0.05 % Commonly known as: TEMOVATE Apply 1 application topically 2 (two) times daily.   ferrous gluconate 324 MG tablet Commonly known as: FERGON Take 1 tablet (324 mg total) by mouth daily with breakfast.   ferrous sulfate 325 (65 FE) MG EC tablet Take 1 tablet (325 mg total) by mouth 2 (two) times daily with a meal.   Fluticasone-Salmeterol 250-50 MCG/DOSE Aepb Commonly known as: Advair Diskus Inhale 1 puff into the lungs 2 (two) times daily.   furosemide 80 MG tablet Commonly known as: Lasix Take 1 tablet (80 mg total) by mouth daily. What changed:   medication strength  how much to take  how to take this  when to take this  additional instructions   gemfibrozil 600 MG tablet Commonly known as: LOPID Take 1 tablet (600 mg total) by mouth 2 (two) times  daily with a meal.   glimepiride 2 MG tablet Commonly known as: AMARYL Take 1 tablet (2 mg total) by mouth daily before breakfast.   glucose blood test strip Commonly known as: True Metrix Blood Glucose Test Use as instructed twice daily diag E11.65   hydrALAZINE 100 MG tablet Commonly known as: APRESOLINE Take 1 tablet (100 mg total) by mouth 3 (three) times daily.   ipratropium-albuterol 0.5-2.5 (3) MG/3ML Soln Commonly known as: DUONEB Take 3 mLs by nebulization every 6 (six) hours as needed.   isosorbide mononitrate 30 MG 24 hr  tablet Commonly known as: IMDUR Take 1 tablet (30 mg total) by mouth 2 (two) times daily. What changed: when to take this   loratadine 10 MG tablet Commonly known as: CLARITIN Take 1 tablet (10 mg total) by mouth daily.   metoprolol succinate 50 MG 24 hr tablet Commonly known as: TOPROL-XL Take 1 tablet (50 mg total) by mouth at bedtime. Take with or immediately following a meal.   nicotine 21 mg/24hr patch Commonly known as: NICODERM CQ - dosed in mg/24 hours Place 1 patch (21 mg total) onto the skin daily.   NovoFine 32G X 6 MM Misc Generic drug: Insulin Pen Needle To use with [place] injections daily   omeprazole 40 MG capsule Commonly known as: PRILOSEC Take 1 capsule (40 mg total) by mouth daily.   Spiriva HandiHaler 18 MCG inhalation capsule Generic drug: tiotropium Place 1 capsule (18 mcg total) into inhaler and inhale daily.   Toujeo SoloStar 300 UNIT/ML Sopn Generic drug: Insulin Glargine (1 Unit Dial) Inject 26 Units into the skin every evening.   triamcinolone ointment 0.1 % Commonly known as: KENALOG Apply 1 application topically 2 (two) times daily.   True Metrix Air Glucose Meter Devi 1 Device by Does not apply route 2 (two) times daily.   True Metrix Level 1 Low Soln Use as directed   TRUEplus Lancets 33G Misc Use as directed twice daily diag E11.65      DISCHARGE INSTRUCTIONS:   DIET:  Regular diet DISCHARGE CONDITION:  Good ACTIVITY:  Activity as tolerated OXYGEN:  Home Oxygen: No.  Oxygen Delivery: room air DISCHARGE LOCATION:  home   If you experience worsening of your admission symptoms, develop shortness of breath, life threatening emergency, suicidal or homicidal thoughts you must seek medical attention immediately by calling 911 or calling your MD immediately  if symptoms less severe.  You Must read complete instructions/literature along with all the possible adverse reactions/side effects for all the Medicines you take and that have  been prescribed to you. Take any new Medicines after you have completely understood and accpet all the possible adverse reactions/side effects.   Please note  You were cared for by a hospitalist during your hospital stay. If you have any questions about your discharge medications or the care you received while you were in the hospital after you are discharged, you can call the unit and asked to speak with the hospitalist on call if the hospitalist that took care of you is not available. Once you are discharged, your primary care physician will handle any further medical issues. Please note that NO REFILLS for any discharge medications will be authorized once you are discharged, as it is imperative that you return to your primary care physician (or establish a relationship with a primary care physician if you do not have one) for your aftercare needs so that they can reassess your need for medications and monitor your lab  values.    On the day of Discharge:  VITAL SIGNS:  Blood pressure 137/66, pulse 63, temperature 97.6 F (36.4 C), temperature source Oral, resp. rate 16, height 5\' 11"  (1.803 m), weight 117.5 kg, SpO2 99 %. PHYSICAL EXAMINATION:  GENERAL:  63 y.o.-year-old patient lying in the bed with no acute distress.  EYES: Pupils equal, round, reactive to light and accommodation. No scleral icterus. Extraocular muscles intact.  HEENT: Head atraumatic, normocephalic. Oropharynx and nasopharynx clear.  NECK:  Supple, no jugular venous distention. No thyroid enlargement, no tenderness.  LUNGS: Normal breath sounds bilaterally, no wheezing, rales,rhonchi or crepitation. No use of accessory muscles of respiration.  CARDIOVASCULAR: S1, S2 normal. No murmurs, rubs, or gallops.  ABDOMEN: Soft, non-tender, non-distended. Bowel sounds present. No organomegaly or mass.  EXTREMITIES: No pedal edema, cyanosis, or clubbing.  NEUROLOGIC: Cranial nerves II through XII are intact. Muscle strength 5/5 in all  extremities. Sensation intact. Gait not checked.  PSYCHIATRIC: The patient is alert and oriented x 3.  SKIN: No obvious rash, lesion, or ulcer.  DATA REVIEW:   CBC Recent Labs  Lab 05/13/19 0542  WBC 9.6  HGB 10.5*  HCT 32.9*  PLT 258    Chemistries  Recent Labs  Lab 05/12/19 0533 05/13/19 0542  NA 139  --   K 3.2*  --   CL 105  --   CO2 21*  --   GLUCOSE 73  --   BUN 47*  --   CREATININE 2.15*  --   CALCIUM 9.0  --   MG 2.2 2.3  AST 18  --   ALT 18  --   ALKPHOS 55  --   BILITOT 0.5  --      Follow-up Information    Leretha Pol E, NP Follow up in 1 week(s).   Specialty: Family Medicine Contact information: [place] [address] [phone]        [name], MD .   Specialty: Cardiology Contact information: [place] [address] [phone]        [facility] Follow up in 1 week(s).   Specialty: Cardiology Contact information: [place] [address] [place] [phone]       [name], MD Follow up in 2 week(s).   Specialty: Gastroenterology Contact information: [place] [address] [phone]        Ottie Glazier, MD Follow up in 1 week(s).   Specialty: Pulmonary Disease Why: copd Contact information: [place] [address] [phone]           Please note this dictation was done on behalf of Dr Hillary Bow as he's no longer with practice.  Max Sane M.D on 05/14/2019 at 4:58 PM    CC: Primary care physician; [name], NP   Note: This dictation was prepared with Dragon dictation along with smaller phrase technology. Any transcriptional errors that result from this process are unintentional.

## 2019-05-14 NOTE — Telephone Encounter (Signed)
Put colonoscopy with 2 day prep in the recall list

## 2019-05-15 ENCOUNTER — Observation Stay (INDEPENDENT_AMBULATORY_CARE_PROVIDER_SITE_OTHER)
Admission: EM | Admit: 2019-05-15 | Discharge: 2019-05-18 | Disposition: A | Discharge status: Home / Self Care | Payer: Medicare PPO | Source: Home / Self Care | Attending: Emergency Medicine | Admitting: Emergency Medicine

## 2019-05-15 ENCOUNTER — Other Ambulatory Visit: Payer: Self-pay

## 2019-05-15 ENCOUNTER — Emergency Department: Payer: Medicare PPO

## 2019-05-15 DIAGNOSIS — R0781 Pleurodynia: Secondary | ICD-10-CM | POA: Diagnosis not present

## 2019-05-15 DIAGNOSIS — N179 Acute kidney failure, unspecified: Secondary | ICD-10-CM | POA: Diagnosis present

## 2019-05-15 DIAGNOSIS — Z85528 Personal history of other malignant neoplasm of kidney: Secondary | ICD-10-CM | POA: Insufficient documentation

## 2019-05-15 DIAGNOSIS — I2510 Atherosclerotic heart disease of native coronary artery without angina pectoris: Secondary | ICD-10-CM | POA: Diagnosis present

## 2019-05-15 DIAGNOSIS — N189 Chronic kidney disease, unspecified: Secondary | ICD-10-CM | POA: Diagnosis not present

## 2019-05-15 DIAGNOSIS — E1165 Type 2 diabetes mellitus with hyperglycemia: Secondary | ICD-10-CM | POA: Diagnosis present

## 2019-05-15 DIAGNOSIS — Z79899 Other long term (current) drug therapy: Secondary | ICD-10-CM | POA: Insufficient documentation

## 2019-05-15 DIAGNOSIS — R58 Hemorrhage, not elsewhere classified: Secondary | ICD-10-CM | POA: Diagnosis not present

## 2019-05-15 DIAGNOSIS — J189 Pneumonia, unspecified organism: Secondary | ICD-10-CM | POA: Diagnosis not present

## 2019-05-15 DIAGNOSIS — Z6839 Body mass index (BMI) 39.0-39.9, adult: Secondary | ICD-10-CM | POA: Diagnosis not present

## 2019-05-15 DIAGNOSIS — N2581 Secondary hyperparathyroidism of renal origin: Secondary | ICD-10-CM | POA: Diagnosis present

## 2019-05-15 DIAGNOSIS — Z20828 Contact with and (suspected) exposure to other viral communicable diseases: Secondary | ICD-10-CM | POA: Diagnosis present

## 2019-05-15 DIAGNOSIS — Z87891 Personal history of nicotine dependence: Secondary | ICD-10-CM | POA: Insufficient documentation

## 2019-05-15 DIAGNOSIS — G4733 Obstructive sleep apnea (adult) (pediatric): Secondary | ICD-10-CM | POA: Diagnosis present

## 2019-05-15 DIAGNOSIS — R0789 Other chest pain: Secondary | ICD-10-CM | POA: Diagnosis not present

## 2019-05-15 DIAGNOSIS — E785 Hyperlipidemia, unspecified: Secondary | ICD-10-CM | POA: Diagnosis not present

## 2019-05-15 DIAGNOSIS — Z03818 Encounter for observation for suspected exposure to other biological agents ruled out: Secondary | ICD-10-CM | POA: Diagnosis not present

## 2019-05-15 DIAGNOSIS — R042 Hemoptysis: Secondary | ICD-10-CM | POA: Diagnosis present

## 2019-05-15 DIAGNOSIS — E1169 Type 2 diabetes mellitus with other specified complication: Secondary | ICD-10-CM | POA: Diagnosis not present

## 2019-05-15 DIAGNOSIS — Z7951 Long term (current) use of inhaled steroids: Secondary | ICD-10-CM | POA: Diagnosis not present

## 2019-05-15 DIAGNOSIS — J301 Allergic rhinitis due to pollen: Secondary | ICD-10-CM | POA: Insufficient documentation

## 2019-05-15 DIAGNOSIS — I5022 Chronic systolic (congestive) heart failure: Secondary | ICD-10-CM

## 2019-05-15 DIAGNOSIS — J441 Chronic obstructive pulmonary disease with (acute) exacerbation: Secondary | ICD-10-CM | POA: Diagnosis present

## 2019-05-15 DIAGNOSIS — I10 Essential (primary) hypertension: Secondary | ICD-10-CM | POA: Diagnosis present

## 2019-05-15 DIAGNOSIS — E1122 Type 2 diabetes mellitus with diabetic chronic kidney disease: Secondary | ICD-10-CM | POA: Diagnosis present

## 2019-05-15 DIAGNOSIS — D631 Anemia in chronic kidney disease: Secondary | ICD-10-CM | POA: Diagnosis present

## 2019-05-15 DIAGNOSIS — M25551 Pain in right hip: Secondary | ICD-10-CM | POA: Diagnosis not present

## 2019-05-15 DIAGNOSIS — I5032 Chronic diastolic (congestive) heart failure: Secondary | ICD-10-CM | POA: Diagnosis not present

## 2019-05-15 DIAGNOSIS — Z9114 Patient's other noncompliance with medication regimen: Secondary | ICD-10-CM | POA: Diagnosis not present

## 2019-05-15 DIAGNOSIS — I130 Hypertensive heart and chronic kidney disease with heart failure and stage 1 through stage 4 chronic kidney disease, or unspecified chronic kidney disease: Secondary | ICD-10-CM | POA: Diagnosis present

## 2019-05-15 DIAGNOSIS — K219 Gastro-esophageal reflux disease without esophagitis: Secondary | ICD-10-CM | POA: Insufficient documentation

## 2019-05-15 DIAGNOSIS — N1832 Chronic kidney disease, stage 3b: Secondary | ICD-10-CM | POA: Diagnosis present

## 2019-05-15 DIAGNOSIS — E1142 Type 2 diabetes mellitus with diabetic polyneuropathy: Secondary | ICD-10-CM | POA: Diagnosis present

## 2019-05-15 DIAGNOSIS — D500 Iron deficiency anemia secondary to blood loss (chronic): Secondary | ICD-10-CM | POA: Diagnosis not present

## 2019-05-15 DIAGNOSIS — I509 Heart failure, unspecified: Secondary | ICD-10-CM

## 2019-05-15 DIAGNOSIS — E8770 Fluid overload, unspecified: Secondary | ICD-10-CM | POA: Diagnosis not present

## 2019-05-15 DIAGNOSIS — K2100 Gastro-esophageal reflux disease with esophagitis, without bleeding: Secondary | ICD-10-CM | POA: Diagnosis present

## 2019-05-15 DIAGNOSIS — R0602 Shortness of breath: Secondary | ICD-10-CM | POA: Diagnosis not present

## 2019-05-15 DIAGNOSIS — J449 Chronic obstructive pulmonary disease, unspecified: Secondary | ICD-10-CM | POA: Diagnosis present

## 2019-05-15 DIAGNOSIS — Z7982 Long term (current) use of aspirin: Secondary | ICD-10-CM | POA: Insufficient documentation

## 2019-05-15 DIAGNOSIS — E11649 Type 2 diabetes mellitus with hypoglycemia without coma: Secondary | ICD-10-CM | POA: Diagnosis not present

## 2019-05-15 DIAGNOSIS — J181 Lobar pneumonia, unspecified organism: Secondary | ICD-10-CM | POA: Diagnosis present

## 2019-05-15 DIAGNOSIS — E782 Mixed hyperlipidemia: Secondary | ICD-10-CM | POA: Insufficient documentation

## 2019-05-15 DIAGNOSIS — Z7952 Long term (current) use of systemic steroids: Secondary | ICD-10-CM | POA: Insufficient documentation

## 2019-05-15 DIAGNOSIS — I5033 Acute on chronic diastolic (congestive) heart failure: Secondary | ICD-10-CM | POA: Diagnosis present

## 2019-05-15 DIAGNOSIS — D509 Iron deficiency anemia, unspecified: Secondary | ICD-10-CM | POA: Diagnosis present

## 2019-05-15 DIAGNOSIS — M545 Low back pain: Secondary | ICD-10-CM | POA: Diagnosis not present

## 2019-05-15 DIAGNOSIS — R531 Weakness: Secondary | ICD-10-CM | POA: Diagnosis present

## 2019-05-15 DIAGNOSIS — Z9989 Dependence on other enabling machines and devices: Secondary | ICD-10-CM | POA: Diagnosis not present

## 2019-05-15 DIAGNOSIS — N183 Chronic kidney disease, stage 3 unspecified: Secondary | ICD-10-CM | POA: Diagnosis not present

## 2019-05-15 DIAGNOSIS — I272 Pulmonary hypertension, unspecified: Secondary | ICD-10-CM | POA: Diagnosis present

## 2019-05-15 DIAGNOSIS — R079 Chest pain, unspecified: Secondary | ICD-10-CM | POA: Diagnosis not present

## 2019-05-15 DIAGNOSIS — Z794 Long term (current) use of insulin: Secondary | ICD-10-CM | POA: Diagnosis not present

## 2019-05-15 DIAGNOSIS — E876 Hypokalemia: Secondary | ICD-10-CM | POA: Diagnosis present

## 2019-05-15 DIAGNOSIS — Z905 Acquired absence of kidney: Secondary | ICD-10-CM | POA: Insufficient documentation

## 2019-05-15 DIAGNOSIS — S79911A Unspecified injury of right hip, initial encounter: Secondary | ICD-10-CM | POA: Diagnosis not present

## 2019-05-15 DIAGNOSIS — E1121 Type 2 diabetes mellitus with diabetic nephropathy: Secondary | ICD-10-CM | POA: Diagnosis not present

## 2019-05-15 LAB — CBC
HCT: 34.2 % — ABNORMAL LOW (ref 39.0–52.0)
Hemoglobin: 11.3 g/dL — ABNORMAL LOW (ref 13.0–17.0)
MCH: 27.9 pg (ref 26.0–34.0)
MCHC: 33 g/dL (ref 30.0–36.0)
MCV: 84.4 fL (ref 80.0–100.0)
Platelets: 253 10*3/uL (ref 150–400)
RBC: 4.05 MIL/uL — ABNORMAL LOW (ref 4.22–5.81)
RDW: 16.8 % — ABNORMAL HIGH (ref 11.5–15.5)
WBC: 10.4 10*3/uL (ref 4.0–10.5)
nRBC: 0 % (ref 0.0–0.2)

## 2019-05-15 LAB — BASIC METABOLIC PANEL
Anion gap: 14 (ref 5–15)
BUN: 61 mg/dL — ABNORMAL HIGH (ref 8–23)
CO2: 22 mmol/L (ref 22–32)
Calcium: 9.3 mg/dL (ref 8.9–10.3)
Chloride: 102 mmol/L (ref 98–111)
Creatinine, Ser: 2.5 mg/dL — ABNORMAL HIGH (ref 0.61–1.24)
GFR calc Af Amer: 30 mL/min — ABNORMAL LOW (ref >60)
GFR calc non Af Amer: 26 mL/min — ABNORMAL LOW (ref >60)
Glucose, Bld: 247 mg/dL — ABNORMAL HIGH (ref 70–99)
Potassium: 3.4 mmol/L — ABNORMAL LOW (ref 3.5–5.1)
Sodium: 138 mmol/L (ref 135–145)

## 2019-05-15 MED ORDER — TRUE METRIX BLOOD GLUCOSE TEST VI STRP: ORAL_STRIP | 3 refills | Status: DC

## 2019-05-15 NOTE — ED Triage Notes (Signed)
Pt comes into the ED via EMS from home with c/o cough up blood since Sunday and schedules for scopy soon. Was just here 11/26. VSS. Pt is in NAD.

## 2019-05-15 NOTE — ED Triage Notes (Addendum)
Pt has been coughing up blood since Thursday. Pt states he is suppose to have a scopy procedure completed soon. Pt states he was just released from here on Monday.  Pt states some indigestion as well.  Pt denies any CP or SOB.  Pt has paper towel to show to the MD where he has been coughing up blood.

## 2019-05-15 NOTE — ED Provider Notes (Signed)
[facility] Emergency Department Provider Note   ____________________________________________   First MD Initiated Contact with Patient 05/15/19 2357     (approximate)  I have reviewed the triage vital signs and the nursing notes.   HISTORY  Chief Complaint Hematemesis    HPI Larry Callahan is a 63 y.o. male who returns to the ED from home with a chief complaint of hemoptysis.  Patient with a history of CAD, DM, CHF, GERD, hypertension, hyperlipidemia who was hospitalized from 11/26-11/30 for same.  Had a noncontrasted CT scan and VQ scan which were unremarkable.  Seen by his pulmonologist Dr. Chancy Milroy who felt that he could do bronchoscopy as an outpatient. Patient was discharged home on Clarithromycin.  States he began to cough up blood again tonight.  Takes an enteric coated aspirin daily.  Denies fever, chest pain, shortness of breath, abdominal pain, nausea, vomiting or lightheadedness.  Denies recent travel or trauma.      Past Medical History:  Diagnosis Date   Allergy    Coronary artery disease    Diabetes mellitus without complication (HCC)    Diastolic CHF ([place])    GERD (gastroesophageal reflux disease)    Hyperlipidemia    Hypertension    Renal insufficiency    Sleep apnea     Patient Active Problem List   Diagnosis Date Noted   AKI (acute kidney injury) ([place]) 05/09/2019   Hemoptysis 04/09/2019   Shortness of breath 03/26/2019   Uncontrolled type 2 diabetes mellitus with insulin therapy ([place]) 03/12/2019   Acute on chronic heart failure with preserved ejection fraction (HFpEF) ([place])    Acute kidney injury superimposed on chronic kidney disease ([place])    CHF (congestive heart failure) ([place]) 45/80/9983   Diastolic dysfunction 38/25/0539   Iron deficiency anemia 12/31/2018   Atopic dermatitis 11/24/2018   Screening for colon cancer 11/06/2017   Uncontrolled type 2 diabetes mellitus with hyperglycemia ([place])  11/06/2017   Dermatitis, unspecified 06/09/2017   Atherosclerotic heart disease of native coronary artery without angina pectoris 06/09/2017   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   Obstructive sleep apnea, adult 06/09/2017   Morbid (severe) obesity due to excess calories ([place]) 06/09/2017   Cigarette nicotine dependence with nicotine-induced disorder 06/09/2017   Type 2 diabetes mellitus with diabetic polyneuropathy ([place]) 06/09/2017   Allergic rhinitis due to pollen 06/09/2017   Mixed hyperlipidemia 06/09/2017   Chronic obstructive pulmonary disease, unspecified ([place]) 06/09/2017   Dyspnea on exertion 06/09/2017   Wheezing 06/09/2017   Dysuria 06/09/2017   Snoring 06/09/2017   Essential hypertension 06/09/2017   Personal history of other malignant neoplasm of kidney 06/09/2017   Pain in right hip 06/09/2017   Impacted cerumen, bilateral 06/09/2017   Secondary hyperparathyroidism, not elsewhere classified ([place]) 06/09/2017   Type 2 diabetes mellitus with hyperglycemia ([place]) 06/09/2017   Tinea corporis 06/09/2017    Past Surgical History:  Procedure Laterality Date   BACK SURGERY     CHOLECYSTECTOMY     COLONOSCOPY WITH PROPOFOL N/A 04/12/2019   Procedure: COLONOSCOPY WITH PROPOFOL;  Surgeon: [name], MD;  Location: [facility] ENDOSCOPY;  Service: Gastroenterology;  Laterality: N/A;   ESOPHAGOGASTRODUODENOSCOPY N/A 04/12/2019   Procedure: ESOPHAGOGASTRODUODENOSCOPY (EGD);  Surgeon: [name], MD;  Location: [facility] ENDOSCOPY;  Service: Gastroenterology;  Laterality: N/A;   left arm surgery     nephrectomy Left    PARATHYROIDECTOMY     RIGHT HEART CATH N/A 03/29/2019   Procedure: RIGHT HEART CATH;  Surgeon: [name], MD;  Location: [facility] CV LAB;  Service: Cardiovascular;  Laterality: N/A;    Prior to Admission medications   Medication Sig Start Date End Date Taking? Authorizing Provider    acetaminophen (TYLENOL) 325 MG tablet Take 2 tablets (650 mg total) by mouth every 4 (four) hours as needed for headache or mild pain. 03/05/19   Gouru, Illene Silver, MD  albuterol (VENTOLIN HFA) 108 (90 Base) MCG/ACT inhaler Inhale 2 puffs into the lungs every 6 (six) hours as needed for wheezing or shortness of breath. 03/05/19   Gouru, Illene Silver, MD  amLODipine (NORVASC) 10 MG tablet Take 1 tablet (10 mg total) by mouth daily. 03/12/19   [name], MD  aspirin EC 81 MG tablet Take 81 mg by mouth daily.    [provider]  Blood Glucose Calibration (TRUE METRIX LEVEL 1) Low SOLN Use as directed 04/04/18   [name], NP  Blood Glucose Monitoring Suppl (TRUE METRIX AIR GLUCOSE METER) DEVI 1 Device by Does not apply route 2 (two) times daily. 04/13/18   [name], NP  clarithromycin (BIAXIN) 500 MG tablet Take 1 tablet (500 mg total) by mouth every 12 (twelve) hours for 7 days. 05/13/19 05/20/19  Marcell Anger, MD  clobetasol cream (TEMOVATE) 1.76 % Apply 1 application topically 2 (two) times daily. 12/31/18   [name], NP  ferrous gluconate (FERGON) 324 MG tablet Take 1 tablet (324 mg total) by mouth daily with breakfast. Patient not taking: Reported on 04/24/2019 12/31/18   [name], NP  ferrous sulfate 325 (65 FE) MG EC tablet Take 1 tablet (325 mg total) by mouth 2 (two) times daily with a meal. 04/13/19 05/13/19  Hillary Bow, MD  Fluticasone-Salmeterol (ADVAIR DISKUS) 250-50 MCG/DOSE AEPB Inhale 1 puff into the lungs 2 (two) times daily. 03/05/19 03/04/20  Nicholes Mango, MD  furosemide (LASIX) 80 MG tablet Take 1 tablet (80 mg total) by mouth daily. 04/13/19   Hillary Bow, MD  gemfibrozil (LOPID) 600 MG tablet Take 1 tablet (600 mg total) by mouth 2 (two) times daily with a meal. 02/28/19   [name], NP  glimepiride (AMARYL) 2 MG tablet Take 1 tablet (2 mg total) by mouth daily before breakfast. 03/13/19   [name], NP  glucose blood  (TRUE METRIX BLOOD GLUCOSE TEST) test strip Use as instructed twice daily diag E11.65 05/15/19   [name], NP  hydrALAZINE (APRESOLINE) 100 MG tablet Take 1 tablet (100 mg total) by mouth 3 (three) times daily. 03/31/19   Gouru, Illene Silver, MD  Insulin Glargine, 1 Unit Dial, (TOUJEO SOLOSTAR) 300 UNIT/ML SOPN Inject 26 Units into the skin every evening. 03/13/19 06/11/19  [name], NP  ipratropium-albuterol (DUONEB) 0.5-2.5 (3) MG/3ML SOLN Take 3 mLs by nebulization every 6 (six) hours as needed. 03/08/19   Kendell Bane, NP  isosorbide mononitrate (IMDUR) 30 MG 24 hr tablet Take 1 tablet (30 mg total) by mouth 2 (two) times daily. 04/13/19   Hillary Bow, MD  loratadine (CLARITIN) 10 MG tablet Take 1 tablet (10 mg total) by mouth daily. 04/01/19   Nicholes Mango, MD  metoprolol succinate (TOPROL-XL) 50 MG 24 hr tablet Take 1 tablet (50 mg total) by mouth at bedtime. Take with or immediately following a meal. 03/12/19   Gollan, Kathlene November, MD  nicotine (NICODERM CQ - DOSED IN MG/24 HOURS) 21 mg/24hr patch Place 1 patch (21 mg total) onto the skin daily. 12/31/18  [name], NP  NOVOFINE 32G X 6 MM MISC To use with [place] injections daily 12/13/18   [name], NP  omeprazole (PRILOSEC) 40 MG capsule Take 1 capsule (40 mg total) by mouth daily. 03/01/19   Kendell Bane, NP  predniSONE (DELTASONE) 20 MG tablet Take 1 tablet (20 mg total) by mouth daily. 04/29/19   Caren Griffins, MD  tiotropium (SPIRIVA HANDIHALER) 18 MCG inhalation capsule Place 1 capsule (18 mcg total) into inhaler and inhale daily. 04/13/19 05/13/19  Hillary Bow, MD  triamcinolone ointment (KENALOG) 0.1 % Apply 1 application topically 2 (two) times daily. 11/23/18   [name], NP  TRUEPLUS LANCETS 33G MISC Use as directed twice daily diag E11.65 04/04/18   [name], NP    Allergies Penicillins  Family History  Problem Relation Age of Onset   Diabetes Mother    Lung cancer Father      Diabetes Sister    Hypertension Sister    Heart disease Sister    Cancer Sister    Bone cancer Brother     Social History Social History   Tobacco Use   Smoking status: Former Smoker   Smokeless tobacco: Former Systems developer    Types: Chew  Substance Use Topics   Alcohol use: No    Frequency: Never   Drug use: No    Review of Systems  Constitutional: No fever/chills Eyes: No visual changes. ENT: No sore throat. Cardiovascular: Denies chest pain. Respiratory: Positive for hemoptysis.  Denies shortness of breath. Gastrointestinal: No abdominal pain.  No nausea, no vomiting.  No diarrhea.  No constipation. Genitourinary: Negative for dysuria. Musculoskeletal: Negative for back pain. Skin: Negative for rash. Neurological: Negative for headaches, focal weakness or numbness.   ____________________________________________   PHYSICAL EXAM:  VITAL SIGNS: ED Triage Vitals  Enc Vitals Group     BP 05/15/19 1513 (!) 156/69     Pulse Rate 05/15/19 1513 83     Resp 05/15/19 1513 18     Temp 05/15/19 1513 98.4 F (36.9 C)     Temp src --      SpO2 05/15/19 1513 98 %     Weight 05/15/19 1511 267 lb 6.7 oz (121.3 kg)     Height 05/15/19 1511 5\' 9"  (1.753 m)     Head Circumference --      Peak Flow --      Pain Score 05/15/19 1510 0     Pain Loc --      Pain Edu? --      Excl. in [place]? --     Constitutional: Alert and oriented. Well appearing and in mild acute distress. Eyes: Conjunctivae are normal. PERRL. EOMI. Head: Atraumatic. Nose: No congestion/rhinnorhea. Mouth/Throat: Mucous membranes are moist.  Oropharynx non-erythematous. Neck: No stridor.   Cardiovascular: Normal rate, regular rhythm. Grossly normal heart sounds.  Good peripheral circulation. Respiratory: Normal respiratory effort.  No retractions. Lungs with scattered rhonchi. Gastrointestinal: Soft and nontender. No distention. No abdominal bruits. No CVA tenderness. Musculoskeletal: No lower extremity  tenderness nor edema.  No joint effusions. Neurologic:  Normal speech and language. No gross focal neurologic deficits are appreciated. No gait instability. Skin:  Skin is warm, dry and intact. No rash noted. Psychiatric: Mood and affect are normal. Speech and behavior are normal.  ____________________________________________   LABS (all labs ordered are listed, but only abnormal results are displayed)  Labs Reviewed  CBC - Abnormal; Notable for the following components:  Result Value   RBC 4.05 (*)    Hemoglobin 11.3 (*)    HCT 34.2 (*)    RDW 16.8 (*)    All other components within normal limits  BASIC METABOLIC PANEL - Abnormal; Notable for the following components:   Potassium 3.4 (*)    Glucose, Bld 247 (*)    BUN 61 (*)    Creatinine, Ser 2.50 (*)    GFR calc non Af Amer 26 (*)    GFR calc Af Amer 30 (*)    All other components within normal limits  TYPE AND SCREEN   ____________________________________________  EKG  ED ECG REPORT I, Maryssa Giampietro J, the attending physician, personally viewed and interpreted this ECG.   Date: 05/16/2019  EKG Time: 0017  Rate: 81  Rhythm: normal EKG, normal sinus rhythm  Axis: Normal  Intervals:none  ST&T Change: Nonspecific PVCs  ____________________________________________  RADIOLOGY  ED MD interpretation: No acute cardiopulmonary process  Official radiology report(s): Dg Chest 2 View  Result Date: 05/16/2019 CLINICAL DATA:  Hemoptysis EXAM: CHEST - 2 VIEW COMPARISON:  May 09, 2019 FINDINGS: There is mild cardiomegaly. Aortic knob calcifications. Both lungs are clear. No acute osseous abnormality. IMPRESSION: No active cardiopulmonary disease. Electronically Signed   By: Prudencio Pair M.D.   On: 05/16/2019 00:35    ____________________________________________   PROCEDURES  Procedure(s) performed (including Critical Care):  Procedures   ____________________________________________   INITIAL IMPRESSION /  ASSESSMENT AND PLAN / ED COURSE  As part of my medical decision making, I reviewed the following data within the [facility] notes reviewed and incorporated, Labs reviewed, EKG interpreted, Old chart reviewed, Radiograph reviewed, Discussed with admitting physician and Notes from prior ED visits     Larry Callahan was evaluated in Emergency Department on 05/16/2019 for the symptoms described in the history of present illness. He was evaluated in the context of the global COVID-19 pandemic, which necessitated consideration that the patient might be at risk for infection with the SARS-CoV-2 virus that causes COVID-19. Institutional protocols and algorithms that pertain to the evaluation of patients at risk for COVID-19 are in a state of rapid change based on information released by regulatory bodies including the CDC and federal and state organizations. These policies and algorithms were followed during the patient's care in the ED.    63 year old male who returns with continued hemoptysis. Differential includes, but is not limited to, viral syndrome, bronchitis including COPD exacerbation, pneumonia, reactive airway disease including asthma, CHF including exacerbation with or without pulmonary/interstitial edema, pneumothorax, ACS, thoracic trauma, and pulmonary embolism.  Laboratory results demonstrate worsening acute kidney injury.  Will type and screen, initiate IV fluid resuscitation.  Discussed with hospitalist to evaluate patient in the emergency department for admission.       ____________________________________________   FINAL CLINICAL IMPRESSION(S) / ED DIAGNOSES  Final diagnoses:  Hemoptysis  AKI (acute kidney injury) [facility])     ED Discharge Orders    None       Note:  This document was prepared using Dragon voice recognition software and may include unintentional dictation errors.   Paulette Blanch, MD 05/16/19 [phone]

## 2019-05-16 ENCOUNTER — Ambulatory Visit: Payer: Medicare PPO | Admitting: Internal Medicine

## 2019-05-16 ENCOUNTER — Other Ambulatory Visit: Payer: Self-pay

## 2019-05-16 ENCOUNTER — Ambulatory Visit: Payer: Medicare PPO | Admitting: Family

## 2019-05-16 DIAGNOSIS — I5022 Chronic systolic (congestive) heart failure: Secondary | ICD-10-CM | POA: Diagnosis not present

## 2019-05-16 DIAGNOSIS — J449 Chronic obstructive pulmonary disease, unspecified: Secondary | ICD-10-CM

## 2019-05-16 DIAGNOSIS — Z9989 Dependence on other enabling machines and devices: Secondary | ICD-10-CM

## 2019-05-16 DIAGNOSIS — E785 Hyperlipidemia, unspecified: Secondary | ICD-10-CM

## 2019-05-16 DIAGNOSIS — G4733 Obstructive sleep apnea (adult) (pediatric): Secondary | ICD-10-CM

## 2019-05-16 DIAGNOSIS — Z6839 Body mass index (BMI) 39.0-39.9, adult: Secondary | ICD-10-CM

## 2019-05-16 DIAGNOSIS — D500 Iron deficiency anemia secondary to blood loss (chronic): Secondary | ICD-10-CM

## 2019-05-16 DIAGNOSIS — N179 Acute kidney failure, unspecified: Secondary | ICD-10-CM

## 2019-05-16 DIAGNOSIS — R042 Hemoptysis: Secondary | ICD-10-CM | POA: Diagnosis not present

## 2019-05-16 DIAGNOSIS — E1169 Type 2 diabetes mellitus with other specified complication: Secondary | ICD-10-CM

## 2019-05-16 DIAGNOSIS — N189 Chronic kidney disease, unspecified: Secondary | ICD-10-CM

## 2019-05-16 DIAGNOSIS — K2100 Gastro-esophageal reflux disease with esophagitis, without bleeding: Secondary | ICD-10-CM

## 2019-05-16 LAB — GLUCOSE, CAPILLARY
Glucose-Capillary: 93 mg/dL (ref 70–99)
Glucose-Capillary: 68 mg/dL — ABNORMAL LOW (ref 70–99)
Glucose-Capillary: 92 mg/dL (ref 70–99)
Glucose-Capillary: 87 mg/dL (ref 70–99)
Glucose-Capillary: 128 mg/dL — ABNORMAL HIGH (ref 70–99)

## 2019-05-16 LAB — BASIC METABOLIC PANEL
Anion gap: 11 (ref 5–15)
BUN: 51 mg/dL — ABNORMAL HIGH (ref 8–23)
CO2: 24 mmol/L (ref 22–32)
Calcium: 9 mg/dL (ref 8.9–10.3)
Chloride: 108 mmol/L (ref 98–111)
Creatinine, Ser: 1.99 mg/dL — ABNORMAL HIGH (ref 0.61–1.24)
GFR calc Af Amer: 40 mL/min — ABNORMAL LOW (ref >60)
GFR calc non Af Amer: 34 mL/min — ABNORMAL LOW (ref >60)
Glucose, Bld: 131 mg/dL — ABNORMAL HIGH (ref 70–99)
Potassium: 3.1 mmol/L — ABNORMAL LOW (ref 3.5–5.1)
Sodium: 143 mmol/L (ref 135–145)

## 2019-05-16 LAB — TYPE AND SCREEN
ABO/RH(D): A POS
Antibody Screen: NEGATIVE

## 2019-05-16 LAB — CBC
HCT: 34.4 % — ABNORMAL LOW (ref 39.0–52.0)
Hemoglobin: 11.2 g/dL — ABNORMAL LOW (ref 13.0–17.0)
MCH: 27.4 pg (ref 26.0–34.0)
MCHC: 32.6 g/dL (ref 30.0–36.0)
MCV: 84.1 fL (ref 80.0–100.0)
Platelets: 231 10*3/uL (ref 150–400)
RBC: 4.09 MIL/uL — ABNORMAL LOW (ref 4.22–5.81)
RDW: 16.9 % — ABNORMAL HIGH (ref 11.5–15.5)
WBC: 8.5 10*3/uL (ref 4.0–10.5)
nRBC: 0 % (ref 0.0–0.2)

## 2019-05-16 LAB — SARS CORONAVIRUS 2 (TAT 6-24 HRS): SARS Coronavirus 2: NEGATIVE

## 2019-05-16 MED ORDER — METOPROLOL SUCCINATE ER 50 MG PO TB24
50.0000 mg | ORAL_TABLET | Freq: Every day | ORAL | Status: DC
  Administered 2019-05-16 – 2019-05-17 (×2): 50 mg via ORAL
  Filled 2019-05-16: qty 2
  Filled 2019-05-16 (×2): qty 1

## 2019-05-16 MED ORDER — ISOSORBIDE MONONITRATE ER 30 MG PO TB24
30.0000 mg | ORAL_TABLET | Freq: Two times a day (BID) | ORAL | Status: DC
  Administered 2019-05-16 – 2019-05-18 (×5): 30 mg via ORAL
  Filled 2019-05-16 (×6): qty 1

## 2019-05-16 MED ORDER — HYDRALAZINE HCL 50 MG PO TABS
100.0000 mg | ORAL_TABLET | Freq: Three times a day (TID) | ORAL | Status: DC
  Administered 2019-05-16 – 2019-05-18 (×6): 100 mg via ORAL
  Filled 2019-05-16 (×7): qty 2

## 2019-05-16 MED ORDER — DEXTROSE 50 % IV SOLN
0.5000 | Freq: Once | INTRAVENOUS | Status: AC
  Administered 2019-05-16: 25 mL via INTRAVENOUS

## 2019-05-16 MED ORDER — PREDNISONE 20 MG PO TABS: 20.0000 mg | ORAL_TABLET | Freq: Every day | ORAL | Status: DC

## 2019-05-16 MED ORDER — SODIUM CHLORIDE 0.9 % IV SOLN
INTRAVENOUS | Status: DC
  Administered 2019-05-16 (×2): via INTRAVENOUS

## 2019-05-16 MED ORDER — IPRATROPIUM-ALBUTEROL 0.5-2.5 (3) MG/3ML IN SOLN
3.0000 mL | RESPIRATORY_TRACT | Status: DC | PRN
  Administered 2019-05-17: 3 mL via RESPIRATORY_TRACT
  Filled 2019-05-16: qty 3

## 2019-05-16 MED ORDER — TIOTROPIUM BROMIDE MONOHYDRATE 18 MCG IN CAPS
18.0000 ug | ORAL_CAPSULE | Freq: Every day | RESPIRATORY_TRACT | Status: DC
  Administered 2019-05-17 – 2019-05-18 (×2): 18 ug via RESPIRATORY_TRACT
  Filled 2019-05-16: qty 5

## 2019-05-16 MED ORDER — MOMETASONE FURO-FORMOTEROL FUM 200-5 MCG/ACT IN AERO
2.0000 | INHALATION_SPRAY | Freq: Two times a day (BID) | RESPIRATORY_TRACT | Status: DC
  Administered 2019-05-16 – 2019-05-18 (×4): 2 via RESPIRATORY_TRACT
  Filled 2019-05-16: qty 8.8

## 2019-05-16 MED ORDER — CLARITHROMYCIN 500 MG PO TABS
500.0000 mg | ORAL_TABLET | Freq: Two times a day (BID) | ORAL | Status: DC
  Administered 2019-05-16 – 2019-05-18 (×5): 500 mg via ORAL
  Filled 2019-05-16 (×7): qty 1

## 2019-05-16 MED ORDER — FERROUS GLUCONATE 324 (38 FE) MG PO TABS
324.0000 mg | ORAL_TABLET | Freq: Every day | ORAL | Status: DC
  Administered 2019-05-17 – 2019-05-18 (×2): 324 mg via ORAL
  Filled 2019-05-16 (×4): qty 1

## 2019-05-16 MED ORDER — SODIUM CHLORIDE 0.9 % IV BOLUS
1000.0000 mL | Freq: Once | INTRAVENOUS | Status: AC
  Administered 2019-05-16: 1000 mL via INTRAVENOUS

## 2019-05-16 MED ORDER — AMLODIPINE BESYLATE 10 MG PO TABS
10.0000 mg | ORAL_TABLET | Freq: Every day | ORAL | Status: DC
  Administered 2019-05-16 – 2019-05-18 (×3): 10 mg via ORAL
  Filled 2019-05-16: qty 1
  Filled 2019-05-16: qty 2
  Filled 2019-05-16: qty 1

## 2019-05-16 MED ORDER — PANTOPRAZOLE SODIUM 40 MG PO TBEC
40.0000 mg | DELAYED_RELEASE_TABLET | Freq: Every day | ORAL | Status: DC
  Administered 2019-05-16 – 2019-05-18 (×3): 40 mg via ORAL
  Filled 2019-05-16 (×3): qty 1

## 2019-05-16 MED ORDER — FUROSEMIDE 40 MG PO TABS
80.0000 mg | ORAL_TABLET | Freq: Every day | ORAL | Status: DC
  Administered 2019-05-16 – 2019-05-18 (×3): 80 mg via ORAL
  Filled 2019-05-16 (×2): qty 2
  Filled 2019-05-16: qty 1

## 2019-05-16 MED ORDER — LORATADINE 10 MG PO TABS
10.0000 mg | ORAL_TABLET | Freq: Every day | ORAL | Status: DC
  Administered 2019-05-17 – 2019-05-18 (×2): 10 mg via ORAL
  Filled 2019-05-16 (×2): qty 1

## 2019-05-16 MED ORDER — DEXTROSE 50 % IV SOLN
INTRAVENOUS | Status: AC
  Filled 2019-05-16: qty 50

## 2019-05-16 MED ORDER — INSULIN DETEMIR 100 UNIT/ML ~~LOC~~ SOLN
20.0000 [IU] | Freq: Every day | SUBCUTANEOUS | Status: DC
  Administered 2019-05-16 – 2019-05-17 (×2): 20 [IU] via SUBCUTANEOUS
  Filled 2019-05-16 (×4): qty 0.2

## 2019-05-16 MED ORDER — INSULIN ASPART 100 UNIT/ML ~~LOC~~ SOLN
0.0000 [IU] | Freq: Three times a day (TID) | SUBCUTANEOUS | Status: DC
  Administered 2019-05-17 – 2019-05-18 (×2): 3 [IU] via SUBCUTANEOUS
  Filled 2019-05-16 (×2): qty 1

## 2019-05-16 NOTE — ED Notes (Signed)
Pt in xray

## 2019-05-16 NOTE — Consult Note (Signed)
.. Larry Callahan, Larry Callahan 63 y.o., male [id number]     Chief Complaint: hemoptysis  HPI: 63 y.o. male with history of several month history of coughing up blood.  Per chart review has been evaluated by pulm as well as GI as well as PCP.  Imaging has been normal.  EGD showed erosive esophagitis and gastritis.  Patient denies nose bleeds.  He reports a feeling of sudden mucus in mouth and then describes it as dark red blood.  Reports breathing issues as well.  His Hgb has decreased down at times to  Around 9.  This morning he had a paper towel amount of bleeding fill his mouth.  No further bleeding since coming to hospital.  PMH: Past Medical History:  Diagnosis Date  . Allergy   . Coronary artery disease   . Diabetes mellitus without complication ([place])   . Diastolic CHF ([place])   . GERD (gastroesophageal reflux disease)   . Hyperlipidemia   . Hypertension   . Renal insufficiency   . Sleep apnea     Surg Hx: Past Surgical History:  Procedure Laterality Date  . BACK SURGERY    . CHOLECYSTECTOMY    . COLONOSCOPY WITH PROPOFOL N/A 04/12/2019   Procedure: COLONOSCOPY WITH PROPOFOL;  Surgeon: [name], MD;  Location: [facility] ENDOSCOPY;  Service: Gastroenterology;  Laterality: N/A;  . ESOPHAGOGASTRODUODENOSCOPY N/A 04/12/2019   Procedure: ESOPHAGOGASTRODUODENOSCOPY (EGD);  Surgeon: [name], MD;  Location: [facility] ENDOSCOPY;  Service: Gastroenterology;  Laterality: N/A;  . left arm surgery    . nephrectomy Left   . PARATHYROIDECTOMY    . RIGHT HEART CATH N/A 03/29/2019   Procedure: RIGHT HEART CATH;  Surgeon: [name], MD;  Location: [place] CV LAB;  Service: Cardiovascular;  Laterality: N/A;    FHx:   Family History  Problem Relation Age of Onset  . Diabetes Mother   . Lung cancer Father   . Diabetes Sister   . Hypertension Sister   . Heart disease Sister   . Cancer Sister   . Bone cancer Brother    SocHx:  reports that he has quit smoking. He has quit  using smokeless tobacco.  His smokeless tobacco use included chew. He reports that he does not drink alcohol or use drugs.  ALLERGIES:  Allergies  Allergen Reactions  . Penicillins Rash    (Not in a hospital admission)   Results for orders placed or performed during the hospital encounter of 05/15/19 (from the past 48 hour(s))  CBC     Status: Abnormal   Collection Time: 05/15/19  3:15 PM  Result Value Ref Range   WBC 10.4 4.0 - 10.5 K/uL   RBC 4.05 (L) 4.22 - 5.81 MIL/uL   Hemoglobin 11.3 (L) 13.0 - 17.0 g/dL   HCT 34.2 (L) 39.0 - 52.0 %   MCV 84.4 80.0 - 100.0 fL   MCH 27.9 26.0 - 34.0 pg   MCHC 33.0 30.0 - 36.0 g/dL   RDW 16.8 (H) 11.5 - 15.5 %   Platelets 253 150 - 400 K/uL   nRBC 0.0 0.0 - 0.2 %    Comment: Performed at [facility], [address] [place], [place] [address]  Basic metabolic panel     Status: Abnormal   Collection Time: 05/15/19  3:15 PM  Result Value Ref Range   Sodium 138 135 - 145 mmol/L   Potassium 3.4 (L) 3.5 - 5.1 mmol/L   Chloride 102 98 - 111 mmol/L  CO2 22 22 - 32 mmol/L   Glucose, Bld 247 (H) 70 - 99 mg/dL   BUN 61 (H) 8 - 23 mg/dL   Creatinine, Ser 2.50 (H) 0.61 - 1.24 mg/dL   Calcium 9.3 8.9 - 10.3 mg/dL   GFR calc non Af Amer 26 (L) >60 mL/min   GFR calc Af Amer 30 (L) >60 mL/min   Anion gap 14 5 - 15    Comment: Performed at [facility], [address] [place], [place] [address]  Type and screen [place]     Status: None   Collection Time: 05/16/19 12:14 AM  Result Value Ref Range   ABO/RH(D) A POS    Antibody Screen NEG    Sample Expiration      05/19/2019,2359 Performed at [facility], [address] [place], [place] [address]   SARS CORONAVIRUS 2 (TAT 6-24 HRS) Nasopharyngeal Nasopharyngeal Swab     Status: None   Collection Time: 05/16/19  4:02 AM   Specimen: Nasopharyngeal Swab  Result Value Ref Range   SARS Coronavirus 2 NEGATIVE NEGATIVE    Comment:  (NOTE) SARS-CoV-2 target nucleic acids are NOT DETECTED. The SARS-CoV-2 RNA is generally detectable in upper and lower respiratory specimens during the acute phase of infection. Negative results do not preclude SARS-CoV-2 infection, do not rule out co-infections with other pathogens, and should not be used as the sole basis for treatment or other patient management decisions. Negative results must be combined with clinical observations, patient history, and epidemiological information. The expected result is Negative. Fact Sheet for Patients: SugarRoll.be Fact Sheet for Healthcare Providers: https://www.woods-mathews.com/ This test is not yet approved or cleared by the [place] FDA and  has been authorized for detection and/or diagnosis of SARS-CoV-2 by FDA under an Emergency Use Authorization (EUA). This EUA will remain  in effect (meaning this test can be used) for the duration of the COVID-19 declaration under Section 56 4(b)(1) of the Act, 21 U.S.C. section 360bbb-3(b)(1), unless the authorization is terminated or revoked sooner. Performed at [facility], [place] [address] [place], [place] [address]   CBC     Status: Abnormal   Collection Time: 05/16/19  6:30 AM  Result Value Ref Range   WBC 8.5 4.0 - 10.5 K/uL   RBC 4.09 (L) 4.22 - 5.81 MIL/uL   Hemoglobin 11.2 (L) 13.0 - 17.0 g/dL   HCT 34.4 (L) 39.0 - 52.0 %   MCV 84.1 80.0 - 100.0 fL   MCH 27.4 26.0 - 34.0 pg   MCHC 32.6 30.0 - 36.0 g/dL   RDW 16.9 (H) 11.5 - 15.5 %   Platelets 231 150 - 400 K/uL   nRBC 0.0 0.0 - 0.2 %    Comment: Performed at [facility], [address] [place], [place] [address]  Basic metabolic panel     Status: Abnormal   Collection Time: 05/16/19  6:30 AM  Result Value Ref Range   Sodium 143 135 - 145 mmol/L   Potassium 3.1 (L) 3.5 - 5.1 mmol/L   Chloride 108 98 - 111 mmol/L   CO2 24 22 - 32 mmol/L   Glucose, Bld 131 (H) 70 - 99  mg/dL   BUN 51 (H) 8 - 23 mg/dL   Creatinine, Ser 1.99 (H) 0.61 - 1.24 mg/dL   Calcium 9.0 8.9 - 10.3 mg/dL   GFR calc non Af Amer 34 (L) >60 mL/min   GFR calc Af Amer 40 (L) >  60 mL/min   Anion gap 11 5 - 15    Comment: Performed at [facility], [address] [place], [place] [address]  Glucose, capillary     Status: None   Collection Time: 05/16/19  9:04 AM  Result Value Ref Range   Glucose-Capillary 93 70 - 99 mg/dL  Glucose, capillary     Status: Abnormal   Collection Time: 05/16/19 12:21 PM  Result Value Ref Range   Glucose-Capillary 68 (L) 70 - 99 mg/dL  Glucose, capillary     Status: None   Collection Time: 05/16/19  1:22 PM  Result Value Ref Range   Glucose-Capillary 92 70 - 99 mg/dL   Dg Chest 2 View  Result Date: 05/16/2019 CLINICAL DATA:  Hemoptysis EXAM: CHEST - 2 VIEW COMPARISON:  May 09, 2019 FINDINGS: There is mild cardiomegaly. Aortic knob calcifications. Both lungs are clear. No acute osseous abnormality. IMPRESSION: No active cardiopulmonary disease. Electronically Signed   By: Prudencio Pair M.D.   On: 05/16/2019 00:35    FMB:BUYZJ, SOB  Blood pressure (!) 161/96, pulse 77, temperature 97.9 F (36.6 C), temperature source Oral, resp. rate 18, height 5\' 9"  (1.753 m), weight 121.3 kg, SpO2 95 %.  PHYSICAL EXAM: Overall appearance- Obese male, NAD Head:Normocephalic Ears:normal  Nose:left sided septal deviation, no dried blood Oral Cavity:poor dentition, no masses or lesions, clear OC/OP Oral Pharynx/Hypopharynx/Larynx-  Normal exam on laryngoscopy Neuro:CN 2 to XII Neck:no LAD  Studies Reviewed: prior CT angiogram  Procedure:  Trans-nasal flexible laryngoscopy-  After verbal consent was obtained, the patient's nasal cavities were anesthetized with gen-nasal and lidocaine.  Trans-nasal flexible laryngoscopy was performed through both nasal cavities revealing normal appearing mucosa.  Normal pharynx, hypopharynx, and  larynx.  Assessment/Plan Hemoptysis with normal ENT exam.  Suspect possible GI source given history and description and prior findings on EGD.  Larry Callahan 05/16/2019, 5:36 PM

## 2019-05-16 NOTE — Progress Notes (Signed)
Brief update note assessment and plan.  Please see same day H&P for full details.  This is a nonbillable note  Patient was seen and examined in the emergency department.  No status changes since admission.  No further episodes of small-volume hemoptysis.  I did discuss with the patient and apparently he was scheduled to be in the pulmonologist office today however instead had presented back to the emergency department.  Communicated with the on-call pulmonologist who was familiar with the patient.  As per previous admission he had requested ENT evaluation for possible upper respiratory source the patient's hemoptysis is the patient's chest imaging have been overall clear.  Have reached out to the ENT service to evaluate the patient with flexible laryngoscopy at bedside.  ENT input is greatly appreciated.  We will continue to monitor hemoglobin though on presentation hemoglobin is 11.7 indicating no evidence of hemodynamically significant bleed.  The patient does have a history of symptomatic anemia with hemoglobin down protein 9.  The likelihood that this is a nosebleed with hemoglobin that low is rather low but appreciate ENT input nonetheless.  Ralene Muskrat MD

## 2019-05-16 NOTE — Progress Notes (Signed)
Report called to receiving nurse Jacqulynn Cadet, RN for pt. being transferred to room 206.

## 2019-05-16 NOTE — H&P (Signed)
History and Physical    Larry Callahan QXI:[id number] DOB: Jan 26, 1956 DOA: 05/15/2019  PCP: [name], NP  Patient coming from: Home  I have personally briefly reviewed patient's old medical records in [place]  Chief Complaint: Hemoptysis  HPI: Larry Callahan is a 63 y.o. male with medical history significant of CHF, hypertension, OSA on CPAP, COPD, type 2 diabetes patient see anemia who presents with symptoms of hemoptysis.  Patient was recently just admitted from 05/09/2019 and discharged on 1130/2020 for the same.  Patient did have resolution of his hemoptysis after discharge but starting yesterday he again began to spit up blood at various time.  Denies seeing any blood clots.  He would also have burning chest pain each time after spitting up blood.  No coughing.  No fevers or chills.  He denies any acute shortness of breath but notes chronic dyspnea due to his COPD.He has tissue with moderate amount of dry red blood at bedside.   CT chest obtained in last admission showed no acute process.  No contrast was used given AKI at the time.  VQ scan also negative with low probability of pulmonary embolism.  Upper endoscopy on 10/30 with biopsy showing erosive esophagitis and gastritis.  Colonoscopy had poor prep but showed hyperplastic polyp.  Patient was evaluated by both critical care and pulmonology during last admission and was discharged home with clarithromycin and plans for possible outpatient bronchoscopy if there is a recurrent of hemoptysis.  Patient reports that he has not started taking the antibiotics yet and is still currently taking his 81 mg aspirin.  ED Course: He was afebrile and mildly hypertensive up to 156 over 60s on room air.  CBC showed no leukocytosis and hemoglobin of 11.3 which is higher than prior of 10.5.  BMP showed potassium of 3.4, glucose of 247, creatinine of 2.5 with BUN of 61.  Chest x-ray negative.  Review of Systems:   Constitutional: No Weight Change, No Fever ENT/Mouth: No sore throat, No Rhinorrhea Eyes: No Eye Pain, No Vision Changes Cardiovascular: No Chest Pain, no SOB,  Respiratory: No Cough, + Sputum, No Wheezing, + chronic Dyspnea  Gastrointestinal: No Nausea, No Vomiting, No Diarrhea, No Constipation, No Pain Genitourinary: no Urinary Incontinence, No Urgency, No Flank Pain Musculoskeletal: No Arthralgias, No Myalgias Skin: No Skin Lesions, No Pruritus, Neuro: no Weakness, No Numbness,  No Loss of Consciousness, No Syncope Psych: No Anxiety/Panic, No Depression, no decrease appetite Heme/Lymph: No Bruising, No Bleeding  Past Medical History:  Diagnosis Date  . Allergy   . Coronary artery disease   . Diabetes mellitus without complication ([place])   . Diastolic CHF ([place])   . GERD (gastroesophageal reflux disease)   . Hyperlipidemia   . Hypertension   . Renal insufficiency   . Sleep apnea     Past Surgical History:  Procedure Laterality Date  . BACK SURGERY    . CHOLECYSTECTOMY    . COLONOSCOPY WITH PROPOFOL N/A 04/12/2019   Procedure: COLONOSCOPY WITH PROPOFOL;  Surgeon: [name], MD;  Location: [facility] ENDOSCOPY;  Service: Gastroenterology;  Laterality: N/A;  . ESOPHAGOGASTRODUODENOSCOPY N/A 04/12/2019   Procedure: ESOPHAGOGASTRODUODENOSCOPY (EGD);  Surgeon: [name], MD;  Location: [facility] ENDOSCOPY;  Service: Gastroenterology;  Laterality: N/A;  . left arm surgery    . nephrectomy Left   . PARATHYROIDECTOMY    . RIGHT HEART CATH N/A 03/29/2019   Procedure: RIGHT HEART CATH;  Surgeon: [name], MD;  Location: [place]  CV LAB;  Service: Cardiovascular;  Laterality: N/A;     reports that he has quit smoking. He has quit using smokeless tobacco.  His smokeless tobacco use included chew. He reports that he does not drink alcohol or use drugs.  Allergies  Allergen Reactions  . Penicillins Rash    Family History  Problem Relation Age of Onset  .  Diabetes Mother   . Lung cancer Father   . Diabetes Sister   . Hypertension Sister   . Heart disease Sister   . Cancer Sister   . Bone cancer Brother      Prior to Admission medications   Medication Sig Start Date End Date Taking? Authorizing Provider  acetaminophen (TYLENOL) 325 MG tablet Take 2 tablets (650 mg total) by mouth every 4 (four) hours as needed for headache or mild pain. 03/05/19   Gouru, Illene Silver, MD  albuterol (VENTOLIN HFA) 108 (90 Base) MCG/ACT inhaler Inhale 2 puffs into the lungs every 6 (six) hours as needed for wheezing or shortness of breath. 03/05/19   Gouru, Illene Silver, MD  amLODipine (NORVASC) 10 MG tablet Take 1 tablet (10 mg total) by mouth daily. 03/12/19   [name], MD  aspirin EC 81 MG tablet Take 81 mg by mouth daily.    [provider]  Blood Glucose Calibration (TRUE METRIX LEVEL 1) Low SOLN Use as directed 04/04/18   [name], NP  Blood Glucose Monitoring Suppl (TRUE METRIX AIR GLUCOSE METER) DEVI 1 Device by Does not apply route 2 (two) times daily. 04/13/18   [name], NP  clarithromycin (BIAXIN) 500 MG tablet Take 1 tablet (500 mg total) by mouth every 12 (twelve) hours for 7 days. 05/13/19 05/20/19  Marcell Anger, MD  clobetasol cream (TEMOVATE) 2.99 % Apply 1 application topically 2 (two) times daily. 12/31/18   [name], NP  ferrous gluconate (FERGON) 324 MG tablet Take 1 tablet (324 mg total) by mouth daily with breakfast. Patient not taking: Reported on 04/24/2019 12/31/18   [name], NP  ferrous sulfate 325 (65 FE) MG EC tablet Take 1 tablet (325 mg total) by mouth 2 (two) times daily with a meal. 04/13/19 05/13/19  Hillary Bow, MD  Fluticasone-Salmeterol (ADVAIR DISKUS) 250-50 MCG/DOSE AEPB Inhale 1 puff into the lungs 2 (two) times daily. 03/05/19 03/04/20  Nicholes Mango, MD  furosemide (LASIX) 80 MG tablet Take 1 tablet (80 mg total) by mouth daily. 04/13/19   Hillary Bow, MD  gemfibrozil  (LOPID) 600 MG tablet Take 1 tablet (600 mg total) by mouth 2 (two) times daily with a meal. 02/28/19   [name], NP  glimepiride (AMARYL) 2 MG tablet Take 1 tablet (2 mg total) by mouth daily before breakfast. 03/13/19   [name], NP  glucose blood (TRUE METRIX BLOOD GLUCOSE TEST) test strip Use as instructed twice daily diag E11.65 05/15/19   [name], NP  hydrALAZINE (APRESOLINE) 100 MG tablet Take 1 tablet (100 mg total) by mouth 3 (three) times daily. 03/31/19   Gouru, Illene Silver, MD  Insulin Glargine, 1 Unit Dial, (TOUJEO SOLOSTAR) 300 UNIT/ML SOPN Inject 26 Units into the skin every evening. 03/13/19 06/11/19  [name], NP  ipratropium-albuterol (DUONEB) 0.5-2.5 (3) MG/3ML SOLN Take 3 mLs by nebulization every 6 (six) hours as needed. 03/08/19   Kendell Bane, NP  isosorbide mononitrate (IMDUR) 30 MG 24 hr tablet Take 1 tablet (30 mg total) by mouth 2 (two) times daily.  04/13/19   Hillary Bow, MD  loratadine (CLARITIN) 10 MG tablet Take 1 tablet (10 mg total) by mouth daily. 04/01/19   Nicholes Mango, MD  metoprolol succinate (TOPROL-XL) 50 MG 24 hr tablet Take 1 tablet (50 mg total) by mouth at bedtime. Take with or immediately following a meal. 03/12/19   Gollan, Kathlene November, MD  nicotine (NICODERM CQ - DOSED IN MG/24 HOURS) 21 mg/24hr patch Place 1 patch (21 mg total) onto the skin daily. 12/31/18   [name], NP  NOVOFINE 32G X 6 MM MISC To use with [place] injections daily 12/13/18   [name], NP  omeprazole (PRILOSEC) 40 MG capsule Take 1 capsule (40 mg total) by mouth daily. 03/01/19   Kendell Bane, NP  predniSONE (DELTASONE) 20 MG tablet Take 1 tablet (20 mg total) by mouth daily. 04/29/19   Caren Griffins, MD  tiotropium (SPIRIVA HANDIHALER) 18 MCG inhalation capsule Place 1 capsule (18 mcg total) into inhaler and inhale daily. 04/13/19 05/13/19  Hillary Bow, MD  triamcinolone ointment (KENALOG) 0.1 % Apply 1 application topically 2 (two)  times daily. 11/23/18   [name], NP  TRUEPLUS LANCETS 33G MISC Use as directed twice daily diag E11.65 04/04/18   [name], NP    Physical Exam: Vitals:   05/15/19 1511 05/15/19 1513  BP:  (!) 156/69  Pulse:  83  Resp:  18  Temp:  98.4 F (36.9 C)  SpO2:  98%  Weight: 121.3 kg   Height: 5\' 9"  (1.753 m)     Constitutional: NAD, calm, comfortable, nontoxic appearing male sitting up in bed Vitals:   05/15/19 1511 05/15/19 1513  BP:  (!) 156/69  Pulse:  83  Resp:  18  Temp:  98.4 F (36.9 C)  SpO2:  98%  Weight: 121.3 kg   Height: 5\' 9"  (1.753 m)    Eyes: PERRL, lids and conjunctivae normal ENMT: Mucous membranes are moist. Posterior pharynx clear of any exudate or lesions.poor dentition.  Neck: normal, supple, no masses,  Respiratory: clear to auscultation bilaterally, no wheezing, no crackles. Normal respiratory effort. No accessory muscle use.  Cardiovascular: Regular rate and rhythm, no murmurs / rubs / gallops.  2+ pedal pulses.  +3 pitting pretibial edema bilaterally Abdomen: no tenderness, no masses palpated.  Bowel sounds positive.  Musculoskeletal: no clubbing / cyanosis. No joint deformity upper and lower extremities. Good ROM, no contractures. Normal muscle tone.  Skin: no rashes, lesions, ulcers. No induration Neurologic: CN 2-12 grossly intact. Sensation intact. Strength 5/5 in all 4.  Psychiatric: Normal judgment and insight. Alert and oriented x 3. Normal mood.     Labs on Admission: I have personally reviewed following labs and imaging studies  CBC: Recent Labs  Lab 05/10/19 0457 05/11/19 0444 05/12/19 0533 05/13/19 0542 05/15/19 1515  WBC 8.3 9.6 9.9 9.6 10.4  HGB 10.4* 10.5* 10.7* 10.5* 11.3*  HCT 32.7* 33.1* 33.5* 32.9* 34.2*  MCV 85.8 85.5 86.1 85.9 84.4  PLT 242 256 262 258 431   Basic Metabolic Panel: Recent Labs  Lab 05/09/19 0911 05/10/19 0457 05/11/19 0444 05/12/19 0533 05/13/19 0542 05/15/19 1515  NA 140 137  140 139  --  138  K 3.7 3.6 3.3* 3.2*  --  3.4*  CL 108 105 107 105  --  102  CO2 20* 20* 22 21*  --  22  GLUCOSE 174* 218* 60* 73  --  247*  BUN 46* 49* 49* 47*  --  61*  CREATININE 2.25* 2.15* 1.90* 2.15*  --  2.50*  CALCIUM 9.1 9.0 9.0 9.0  --  9.3  MG  --  2.0 2.1 2.2 2.3  --    GFR: Estimated Creatinine Clearance: 37.9 mL/min (A) (by C-G formula based on SCr of 2.5 mg/dL (H)). Liver Function Tests: Recent Labs  Lab 05/09/19 0911 05/10/19 0457 05/11/19 0444 05/12/19 0533  AST 25 19 19 18   ALT 18 17 17 18   ALKPHOS 60 53 49 55  BILITOT 0.6 0.5 0.6 0.5  PROT 6.7 6.2* 6.5 6.6  ALBUMIN 3.1* 2.8* 3.0* 3.1*   Recent Labs  Lab 05/09/19 0911  LIPASE 40   No results for input(s): AMMONIA in the last 168 hours. Coagulation Profile: Recent Labs  Lab 05/10/19 1640  INR 1.0   Cardiac Enzymes: No results for input(s): CKTOTAL, CKMB, CKMBINDEX, TROPONINI in the last 168 hours. BNP (last 3 results) No results for input(s): PROBNP in the last 8760 hours. HbA1C: No results for input(s): HGBA1C in the last 72 hours. CBG: Recent Labs  Lab 05/12/19 2308 05/13/19 0404 05/13/19 0736 05/13/19 1216 05/13/19 1628  GLUCAP 202* 190* 100* 134* 276*   Lipid Profile: No results for input(s): CHOL, HDL, LDLCALC, TRIG, CHOLHDL, LDLDIRECT in the last 72 hours. Thyroid Function Tests: No results for input(s): TSH, T4TOTAL, FREET4, T3FREE, THYROIDAB in the last 72 hours. Anemia Panel: No results for input(s): VITAMINB12, FOLATE, FERRITIN, TIBC, IRON, RETICCTPCT in the last 72 hours. Urine analysis:    Component Value Date/Time   COLORURINE YELLOW (A) 04/23/2019 1852   APPEARANCEUR CLEAR (A) 04/23/2019 1852   APPEARANCEUR Cloudy (A) 11/23/2018 1103   LABSPEC 1.011 04/23/2019 1852   LABSPEC 1.005 03/06/2013 0929   PHURINE 5.0 04/23/2019 1852   GLUCOSEU NEGATIVE 04/23/2019 1852   GLUCOSEU Negative 03/06/2013 0929   HGBUR NEGATIVE 04/23/2019 1852   BILIRUBINUR NEGATIVE 04/23/2019  1852   BILIRUBINUR negative 02/20/2019 1404   BILIRUBINUR Negative 11/23/2018 1103   BILIRUBINUR Negative 03/06/2013 [place] 04/23/2019 1852   PROTEINUR 100 (A) 04/23/2019 1852   UROBILINOGEN 0.2 02/20/2019 1404   NITRITE NEGATIVE 04/23/2019 1852   LEUKOCYTESUR NEGATIVE 04/23/2019 1852   LEUKOCYTESUR Negative 03/06/2013 0929    Radiological Exams on Admission: Dg Chest 2 View  Result Date: 05/16/2019 CLINICAL DATA:  Hemoptysis EXAM: CHEST - 2 VIEW COMPARISON:  May 09, 2019 FINDINGS: There is mild cardiomegaly. Aortic knob calcifications. Both lungs are clear. No acute osseous abnormality. IMPRESSION: No active cardiopulmonary disease. Electronically Signed   By: Prudencio Pair M.D.   On: 05/16/2019 00:35    EKG: Independently reviewed.   Assessment/Plan  Possible hemoptysis -Had negative CT chest and VQ scan during last admission a few days ago.  - Pulmonology recommended possible bronchoscopy should symptoms arise again-will consult here. Sees Dr. Humphrey Rolls outpatient -Hemoglobin is stable.  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  Admission creatinine of 2.50 - Has received 1L of NS fluid in ED -Avoid nephrotoxic drugs  Chronic congestive heart failure -Has lower extremity edema but still appears to be compensated - continue Lasix but monitor renal function closely - continue metoprolol  Hypertension -Continue amlodipine, hydralazine, Imdur   COPD -Stable.  Does not appear to be in acute exacerbation -continue bronchodilators   Iron deficiency anemia -Hemoglobin stable. Continue iron  OSA -Continue CPAP  Type 2 diabetes -hold  home meds and insulin -start 20 units with moderate SSI  GERD -continue PPI  Morbid obesity -BMI greater than 35  DVT prophylaxis:SCD Code Status: Full Family Communication:  Plan discussed with patient at bedside  disposition Plan: Home with observation Consults called: pulmonology Admission status: Observation   Fendi Meinhardt T Izza Bickle DO [facility]   If 7PM-7AM, please contact night-coverage [email or web address] Password [email or web address]  05/16/2019, 1:32 AM

## 2019-05-16 NOTE — ED Notes (Signed)
Pt give ice chips with MD Beather Arbour permission.

## 2019-05-17 ENCOUNTER — Encounter
Admission: EM | Disposition: A | Discharge status: Home / Self Care | Payer: Self-pay | Source: Home / Self Care | Attending: Emergency Medicine

## 2019-05-17 ENCOUNTER — Encounter: Payer: Self-pay | Admitting: *Deleted

## 2019-05-17 DIAGNOSIS — R042 Hemoptysis: Secondary | ICD-10-CM | POA: Diagnosis not present

## 2019-05-17 HISTORY — PX: FLEXIBLE BRONCHOSCOPY: SHX5094

## 2019-05-17 LAB — GLUCOSE, CAPILLARY
Glucose-Capillary: 123 mg/dL — ABNORMAL HIGH (ref 70–99)
Glucose-Capillary: 183 mg/dL — ABNORMAL HIGH (ref 70–99)
Glucose-Capillary: 108 mg/dL — ABNORMAL HIGH (ref 70–99)
Glucose-Capillary: 51 mg/dL — ABNORMAL LOW (ref 70–99)
Glucose-Capillary: 119 mg/dL — ABNORMAL HIGH (ref 70–99)
Glucose-Capillary: 64 mg/dL — ABNORMAL LOW (ref 70–99)
Glucose-Capillary: 102 mg/dL — ABNORMAL HIGH (ref 70–99)

## 2019-05-17 LAB — CBC WITH DIFFERENTIAL/PLATELET
Abs Immature Granulocytes: 0.07 10*3/uL (ref 0.00–0.07)
Basophils Absolute: 0.1 10*3/uL (ref 0.0–0.1)
Basophils Relative: 1 %
Eosinophils Absolute: 0.2 10*3/uL (ref 0.0–0.5)
Eosinophils Relative: 3 %
HCT: 32.4 % — ABNORMAL LOW (ref 39.0–52.0)
Hemoglobin: 10.5 g/dL — ABNORMAL LOW (ref 13.0–17.0)
Immature Granulocytes: 1 %
Lymphocytes Relative: 21 %
Lymphs Abs: 1.5 10*3/uL (ref 0.7–4.0)
MCH: 27.6 pg (ref 26.0–34.0)
MCHC: 32.4 g/dL (ref 30.0–36.0)
MCV: 85 fL (ref 80.0–100.0)
Monocytes Absolute: 0.6 10*3/uL (ref 0.1–1.0)
Monocytes Relative: 9 %
Neutro Abs: 4.9 10*3/uL (ref 1.7–7.7)
Neutrophils Relative %: 65 %
Platelets: 214 10*3/uL (ref 150–400)
RBC: 3.81 MIL/uL — ABNORMAL LOW (ref 4.22–5.81)
RDW: 16.8 % — ABNORMAL HIGH (ref 11.5–15.5)
WBC: 7.3 10*3/uL (ref 4.0–10.5)
nRBC: 0 % (ref 0.0–0.2)

## 2019-05-17 LAB — BASIC METABOLIC PANEL
Anion gap: 10 (ref 5–15)
BUN: 40 mg/dL — ABNORMAL HIGH (ref 8–23)
CO2: 24 mmol/L (ref 22–32)
Calcium: 8.3 mg/dL — ABNORMAL LOW (ref 8.9–10.3)
Chloride: 104 mmol/L (ref 98–111)
Creatinine, Ser: 1.83 mg/dL — ABNORMAL HIGH (ref 0.61–1.24)
GFR calc Af Amer: 44 mL/min — ABNORMAL LOW (ref >60)
GFR calc non Af Amer: 38 mL/min — ABNORMAL LOW (ref >60)
Glucose, Bld: 205 mg/dL — ABNORMAL HIGH (ref 70–99)
Potassium: 3.1 mmol/L — ABNORMAL LOW (ref 3.5–5.1)
Sodium: 138 mmol/L (ref 135–145)

## 2019-05-17 LAB — BODY FLUID CELL COUNT WITH DIFFERENTIAL
Lymphs, Fluid: 27 %
Monocyte-Macrophage-Serous Fluid: 69 % (ref 50–90)
Neutrophil Count, Fluid: 4 % (ref 0–25)
Total Nucleated Cell Count, Fluid: 178 cu mm (ref 0–1000)

## 2019-05-17 LAB — MAGNESIUM: Magnesium: 2 mg/dL (ref 1.7–2.4)

## 2019-05-17 SURGERY — BRONCHOSCOPY, FLEXIBLE
Anesthesia: Moderate Sedation | Laterality: Bilateral

## 2019-05-17 MED ORDER — BISACODYL 10 MG RE SUPP: 10.0000 mg | Freq: Every day | RECTAL | Status: DC | PRN

## 2019-05-17 MED ORDER — POTASSIUM CHLORIDE IN NACL 20-0.9 MEQ/L-% IV SOLN
INTRAVENOUS | Status: DC
  Administered 2019-05-17: 11:00:00 via INTRAVENOUS
  Filled 2019-05-17 (×3): qty 1000

## 2019-05-17 MED ORDER — DEXTROSE 50 % IV SOLN
50.0000 mL | Freq: Once | INTRAVENOUS | Status: AC
  Administered 2019-05-17: 15:00:00 50 mL via INTRAVENOUS

## 2019-05-17 MED ORDER — INSULIN DETEMIR 100 UNIT/ML ~~LOC~~ SOLN
10.0000 [IU] | Freq: Every day | SUBCUTANEOUS | Status: DC
  Filled 2019-05-17 (×2): qty 0.1

## 2019-05-17 MED ORDER — POTASSIUM CHLORIDE CRYS ER 20 MEQ PO TBCR
40.0000 meq | EXTENDED_RELEASE_TABLET | Freq: Once | ORAL | Status: AC
  Administered 2019-05-17: 40 meq via ORAL
  Filled 2019-05-17: qty 2

## 2019-05-17 MED ORDER — DEXTROSE 50 % IV SOLN
12.5000 g | Freq: Once | INTRAVENOUS | Status: AC
  Administered 2019-05-17: 12.5 g via INTRAVENOUS

## 2019-05-17 MED ORDER — MIDAZOLAM HCL 2 MG/2ML IJ SOLN
INTRAMUSCULAR | Status: AC
  Filled 2019-05-17: qty 6

## 2019-05-17 MED ORDER — MIDAZOLAM HCL 2 MG/2ML IJ SOLN
INTRAMUSCULAR | Status: DC | PRN
  Administered 2019-05-17 (×2): 1 mg via INTRAVENOUS

## 2019-05-17 MED ORDER — FENTANYL CITRATE (PF) 100 MCG/2ML IJ SOLN
INTRAMUSCULAR | Status: AC
  Filled 2019-05-17: qty 4

## 2019-05-17 MED ORDER — SODIUM CHLORIDE 0.9 % IV SOLN
INTRAVENOUS | Status: DC
  Administered 2019-05-17: 15:00:00 via INTRAVENOUS

## 2019-05-17 MED ORDER — CHLORHEXIDINE GLUCONATE 0.12 % MT SOLN
15.0000 mL | Freq: Two times a day (BID) | OROMUCOSAL | Status: DC
  Administered 2019-05-17 – 2019-05-18 (×3): 15 mL via OROMUCOSAL
  Filled 2019-05-17 (×3): qty 15

## 2019-05-17 MED ORDER — POLYETHYLENE GLYCOL 3350 17 G PO PACK: 17.0000 g | PACK | Freq: Every day | ORAL | Status: DC | PRN

## 2019-05-17 MED ORDER — DEXTROSE 50 % IV SOLN
INTRAVENOUS | Status: AC
  Filled 2019-05-17: qty 50

## 2019-05-17 MED ORDER — SENNOSIDES-DOCUSATE SODIUM 8.6-50 MG PO TABS
1.0000 | ORAL_TABLET | Freq: Two times a day (BID) | ORAL | Status: DC
  Administered 2019-05-17 – 2019-05-18 (×3): 1 via ORAL
  Filled 2019-05-17 (×3): qty 1

## 2019-05-17 MED ORDER — ORAL CARE MOUTH RINSE
15.0000 mL | Freq: Two times a day (BID) | OROMUCOSAL | Status: DC
  Administered 2019-05-17: 15 mL via OROMUCOSAL

## 2019-05-17 MED ORDER — DEXTROSE 50 % IV SOLN
INTRAVENOUS | Status: AC
  Administered 2019-05-17: 50 mL via INTRAVENOUS
  Filled 2019-05-17: qty 50

## 2019-05-17 NOTE — Plan of Care (Signed)
  Problem: Education: Goal: Knowledge of General Education information will improve Description Including pain rating scale, medication(s)/side effects and non-pharmacologic comfort measures Outcome: Progressing   

## 2019-05-17 NOTE — Progress Notes (Signed)
PROGRESS NOTE    Servando Kyllonen Klunk  FIE:[id number] DOB: 03/19/1956 DOA: 05/15/2019 PCP: [name], NP   Brief Narrative:  Patient is a 63 year old male who presents for recurrent hemoptysis.  Patient had recent admission where the work-up for hemoptysis was unrevealing.  Patient was seen by pulmonary on previous admission who recommended referral to outpatient for consideration for outpatient bronchoscopy.  Patient was scheduled to present to the pulmonary office 12 3 however had recurrent hemoptysis and presented to the ED instead.  On presentation patient is hemodynamically stable.  He does have historical documentation significant for hemoglobin of 9 however hemoglobin on presentation was 11.7.  Pulmonary was consulted with recommendations to reach out to ENT service.  ENT performed flexible laryngoscopy on 12 3 with no evidence of mass or intranasal bleeding in the upper respiratory tract.  12/4: Plan for bronchoscopy today.  No further bleeding episodes   Assessment & Plan:   Principal Problem:   Hemoptysis Active Problems:   Obstructive sleep apnea, adult   Chronic obstructive pulmonary disease, unspecified (HCC)   Essential hypertension   Type 2 diabetes mellitus with hyperglycemia (HCC)   CHF (congestive heart failure) (HCC)   Acute kidney injury superimposed on chronic kidney disease ([place])  Possible hemoptysis - Had negative CT chest and VQ scan during last admission a few days ago.  - Pulmonology recommended possible bronchoscopy should symptoms arise again - Hemoglobin is stable.  will continue to monitor - Continue clarithromycin for now pending pulm recommendation - Bedside laryngoscopy on 12/3 negative for bleed - Plan for bronchoscopy 12/4, n.p.o. for procedure  Acute on chronic kidney disease stage IIIb- improving - S/p left nephrectomy -Baseline creatinine of 1.8.  Admission creatinine of 2.50 - Has received 1L of NS fluid in ED -Avoid nephrotoxic  drugs  Chronic congestive heart failure -Has lower extremity edema but still appears to be compensated - continue Lasix but monitor renal function closely - continue metoprolol  Hypertension -Continue amlodipine, hydralazine, Imdur   COPD -Stable.  Does not appear to be in acute exacerbation -continue bronchodilators   Iron deficiency anemia -Hemoglobin stable. Continue iron  OSA -Continue CPAP  Type 2 diabetes -hold home meds and insulin -start 20 units with moderate SSI  GERD -continue PPI  Morbid obesity -BMI greater than 35   DVT prophylaxis: mechanical in setting of bleed Code Status: FULL Family Communication: none today Disposition Plan: Expected home, 12/5  Consultants:   ENT   Procedures:    Bronchoscopy- 12/4  Laryngoscopy- 12/3  Antimicrobials: (specify start and planned stop date. Auto populated tables are space occupying and do not give end dates)  Clarithromycin (chronic)   Subjective: Seen and examined No further episodes of hemoptysis Hypoglycemia noted No new complaints  Objective: Vitals:   05/16/19 2346 05/17/19 0432 05/17/19 0800 05/17/19 1226  BP:  (!) 141/81 139/71 125/63  Pulse: 75 76 (!) 57 70  Resp: 20 20  20   Temp:  98.1 F (36.7 C)  97.6 F (36.4 C)  TempSrc:    Oral  SpO2: 96% 96% 97% 93%  Weight:      Height:        Intake/Output Summary (Last 24 hours) at 05/17/2019 1325 Last data filed at 05/17/2019 1040 Gross per 24 hour  Intake 240 ml  Output 1205 ml  Net -965 ml   Filed Weights   05/15/19 1511  Weight: 121.3 kg    Examination:  General exam: Appears calm and comfortable  Respiratory system:  Clear to auscultation. Respiratory effort normal. Cardiovascular system: S1 & S2 heard, RRR. No JVD, murmurs, rubs, gallops or clicks. No pedal edema. Gastrointestinal system: Abdomen is nondistended, soft and nontender. No organomegaly or masses felt. Normal bowel sounds heard. Central nervous system:  Alert and oriented. No focal neurological deficits. Extremities: Symmetric 5 x 5 power. Skin: No rashes, lesions or ulcers Psychiatry: Judgement and insight appear normal. Mood & affect appropriate.     Data Reviewed: I have personally reviewed following labs and imaging studies  CBC: Recent Labs  Lab 05/12/19 0533 05/13/19 0542 05/15/19 1515 05/16/19 0630 05/17/19 0729  WBC 9.9 9.6 10.4 8.5 7.3  NEUTROABS  --   --   --   --  4.9  HGB 10.7* 10.5* 11.3* 11.2* 10.5*  HCT 33.5* 32.9* 34.2* 34.4* 32.4*  MCV 86.1 85.9 84.4 84.1 85.0  PLT 262 258 253 231 865   Basic Metabolic Panel: Recent Labs  Lab 05/11/19 0444 05/12/19 0533 05/13/19 0542 05/15/19 1515 05/16/19 0630 05/17/19 0729 05/17/19 0830  NA 140 139  --  138 143 138  --   K 3.3* 3.2*  --  3.4* 3.1* 3.1*  --   CL 107 105  --  102 108 104  --   CO2 22 21*  --  22 24 24   --   GLUCOSE 60* 73  --  247* 131* 205*  --   BUN 49* 47*  --  61* 51* 40*  --   CREATININE 1.90* 2.15*  --  2.50* 1.99* 1.83*  --   CALCIUM 9.0 9.0  --  9.3 9.0 8.3*  --   MG 2.1 2.2 2.3  --   --   --  2.0   GFR: Estimated Creatinine Clearance: 51.7 mL/min (A) (by C-G formula based on SCr of 1.83 mg/dL (H)). Liver Function Tests: Recent Labs  Lab 05/11/19 0444 05/12/19 0533  AST 19 18  ALT 17 18  ALKPHOS 49 55  BILITOT 0.6 0.5  PROT 6.5 6.6  ALBUMIN 3.0* 3.1*   No results for input(s): LIPASE, AMYLASE in the last 168 hours. No results for input(s): AMMONIA in the last 168 hours. Coagulation Profile: Recent Labs  Lab 05/10/19 1640  INR 1.0   Cardiac Enzymes: No results for input(s): CKTOTAL, CKMB, CKMBINDEX, TROPONINI in the last 168 hours. BNP (last 3 results) No results for input(s): PROBNP in the last 8760 hours. HbA1C: No results for input(s): HGBA1C in the last 72 hours. CBG: Recent Labs  Lab 05/16/19 2246 05/17/19 0453 05/17/19 0832 05/17/19 1237 05/17/19 1310  GLUCAP 128* 123* 183* 51* 108*   Lipid Profile: No  results for input(s): CHOL, HDL, LDLCALC, TRIG, CHOLHDL, LDLDIRECT in the last 72 hours. Thyroid Function Tests: No results for input(s): TSH, T4TOTAL, FREET4, T3FREE, THYROIDAB in the last 72 hours. Anemia Panel: No results for input(s): VITAMINB12, FOLATE, FERRITIN, TIBC, IRON, RETICCTPCT in the last 72 hours. Sepsis Labs: No results for input(s): PROCALCITON, LATICACIDVEN in the last 168 hours.  Recent Results (from the past 240 hour(s))  SARS CORONAVIRUS 2 (TAT 6-24 HRS) Nasopharyngeal Nasopharyngeal Swab     Status: None   Collection Time: 05/16/19  4:02 AM   Specimen: Nasopharyngeal Swab  Result Value Ref Range Status   SARS Coronavirus 2 NEGATIVE NEGATIVE Final    Comment: (NOTE) SARS-CoV-2 target nucleic acids are NOT DETECTED. The SARS-CoV-2 RNA is generally detectable in upper and lower respiratory specimens during the acute phase of infection. Negative results do not  preclude SARS-CoV-2 infection, do not rule out co-infections with other pathogens, and should not be used as the sole basis for treatment or other patient management decisions. Negative results must be combined with clinical observations, patient history, and epidemiological information. The expected result is Negative. Fact Sheet for Patients: SugarRoll.be Fact Sheet for Healthcare Providers: https://www.woods-mathews.com/ This test is not yet approved or cleared by the [place] FDA and  has been authorized for detection and/or diagnosis of SARS-CoV-2 by FDA under an Emergency Use Authorization (EUA). This EUA will remain  in effect (meaning this test can be used) for the duration of the COVID-19 declaration under Section 56 4(b)(1) of the Act, 21 U.S.C. section 360bbb-3(b)(1), unless the authorization is terminated or revoked sooner. Performed at [facility], [address], [place], [place] [address]          Radiology Studies: Dg Chest 2 View   Result Date: 05/16/2019 CLINICAL DATA:  Hemoptysis EXAM: CHEST - 2 VIEW COMPARISON:  May 09, 2019 FINDINGS: There is mild cardiomegaly. Aortic knob calcifications. Both lungs are clear. No acute osseous abnormality. IMPRESSION: No active cardiopulmonary disease. Electronically Signed   By: Prudencio Pair M.D.   On: 05/16/2019 00:35        Scheduled Meds: . amLODipine  10 mg Oral Daily  . chlorhexidine  15 mL Mouth Rinse BID  . clarithromycin  500 mg Oral Q12H  . ferrous gluconate  324 mg Oral Q breakfast  . furosemide  80 mg Oral Daily  . hydrALAZINE  100 mg Oral TID  . insulin aspart  0-15 Units Subcutaneous TID WC  . insulin detemir  20 Units Subcutaneous QHS  . isosorbide mononitrate  30 mg Oral BID  . loratadine  10 mg Oral Daily  . mouth rinse  15 mL Mouth Rinse q12n4p  . metoprolol succinate  50 mg Oral QHS  . mometasone-formoterol  2 puff Inhalation BID  . pantoprazole  40 mg Oral Daily  . tiotropium  18 mcg Inhalation Daily   Continuous Infusions: . 0.9 % NaCl with KCl 20 mEq / L 50 mL/hr at 05/17/19 1040     LOS: 1 day    Time spent: 35 minutes    Sidney Ace, MD [facility] Pager [phone]  If 7PM-7AM, please contact night-coverage [email or web address] Password [email or web address] 05/17/2019, 1:25 PM

## 2019-05-17 NOTE — Procedures (Signed)
Date: 05/17/2019,  MRN# [id number]    Procedure Note: Fiberoptic Bronchoscopy   PROCEDURE DATE: 05/17/2019     NAME:  Larry Callahan   DOB:11-Sep-1955   MRN: [id number] LOC:  ARPO/None      Indications/Preliminary Diagnosis: Hemoptysis  Consent: (Place X beside choice/s below)  The benefits, risks and possible complications of the procedure were        explained to:  __X_ patient  ___ patient's family  ___ other:___________  who verbalized understanding and gave:  ___ verbal  __X_ written  ___ verbal and written  ___ telephone  ___ other:________ consent.      Unable to obtain consent; procedure performed on emergent basis.     Other:      PRESEDATION ASSESSMENT: History and Physical has been performed. Patient meds and allergies have been reviewed. Presedation airway examination has been performed and documented. Baseline vital signs, sedation score, oxygenation status, and cardiac rhythm were reviewed. Patient was deemed to be in satisfactory condition to undergo the procedure.  PREMEDICATIONS:   Sedative/Narcotic Amt Dose   Versed 2 mg   Fentanyl 0 Mcg  Diprivan 0 mg         Insertion Route (Place X beside choice below)  X Nasal   Oral   Endotracheal Tube   Tracheostomy   INTRAPROCEDURE MEDICATIONS:  Sedative/Narcotic Amt Dose   Versed 0 mg   Fentanyl 0 Mcg  Diprivan 0 mg       Medication Amt Dose  Medication Amt Dose  Xylocaine 2% 15 cc  Epinephrine 1:10,000 sol  cc  Xylocaine 4%  cc  Cocaine  cc   TECHNICAL PROCEDURES: (Place X beside choice below)   Procedures  Description  X  None     Electrocautery     Cryotherapy     Balloon Dilatation     Bronchography     Stent Placement     Therapeutic Aspiration     Laser/Argon Plasma            SPECIMENS (Sites): (Place X beside choice below)  Specimens Description   No Specimens Obtained     [place]ash[place]   X Lavage  right middle lobe   Biopsies    Fine Needle Aspirates    Brushings    Sputum     FINDINGS:  ESTIMATED BLOOD LOSS: none  COMPLICATIONS/RESOLUTION: none  PROCEDURE DETAILS: Timeout performed and correct patient, name, & ID confirmed. Following prep per Pulmonary policy, appropriate sedation was administered.  Airway exam proceeded with findings, technical procedures, and specimen collection as noted below. At the end of exam the scope was withdrawn without incident. Impression and Plan as noted below.    Procedure Note: After informed written consent was obtained from vision patient was prepared in the usual manner.  The patient apparently has been having issues with hemoptysis.  He had a EGD done which was unremarkable had a nasopharyngoscopy done which was unremarkable.  Patient states that today he has not had any further hemoptysis.  Since he has been admitted now for the second time with hemoptysis and the work-up essentially has been unremarkable it was decided to go ahead and do a fiberoptic bronchoscopy.  The fiberoptic bronchoscope was inserted down through the left nostril to the vocal cords the vocal cords were found to be free of any blood.  The epiglottis was not swollen there was no swelling in the vocal cords either.  Vocal cords were anesthetized with 1% lidocaine and they were traversed  without any difficulty.  The trachea was not hyperemic or inflamed.  There was no evidence of blood within the trachea and no endobronchial lesions were noted within the trachea.  Next the fiberoptic scope was inserted into the right lung.  Right mainstem right upper lobe right middle lobe right lower lobe was all examined and found to have no evidence of any blood from any subsegments.  Next the scope was withdrawn and inserted into the left lung left upper lobe lingula and lower lobe were evaluated and there was no evidence of any bleeding no evidence of any old or fresh blood noted for that matter.  Scope was withdrawn then inserted and wedged into the right middle lobe.   Approximately 120 cc of saline was placed and lavage was performed.  There was good return.  The return was cloudy it was not bloody or pink in any way.  The return was collected and will be sent to the lab for routine cultures.    IMPRESSION:POST-PROCEDURE DX: Hemoptysis with a normal bronchoscopy of the visualized bronchial tree   RECOMMENDATION/PLAN: Continued observation cultures have been sent and we will follow-up on the cultures.  Patient can be seen in the office for follow-up.  I have personally performed the procedure as noted above     [name], MD [facility] Pulmonary Critical Care Medicine

## 2019-05-17 NOTE — Consult Note (Addendum)
[place] for Electrolyte Monitoring and Replacement   Recent Labs: Potassium (mmol/L)  Date Value  05/17/2019 3.1 (L)  03/06/2013 3.9   Magnesium (mg/dL)  Date Value  05/13/2019 2.3  12/11/2012 1.8   Calcium (mg/dL)  Date Value  05/17/2019 8.3 (L)   Calcium, Total (mg/dL)  Date Value  03/06/2013 9.3   Albumin (g/dL)  Date Value  05/12/2019 3.1 (L)  02/27/2018 3.6  03/06/2013 3.4   Sodium (mmol/L)  Date Value  05/17/2019 138  02/27/2018 141  03/06/2013 138   Corrected Ca: 9.7 mg/dL Add-On magnesium: 2.0 mg/dL  Assessment: 63 y.o. male with medical history significant of CHF, hypertension, OSA on CPAP, COPD, type 2 diabetes patient see anemia who presents with symptoms of hemoptysis. He is currently being administered IVMF NS at 50 mL/hr and furosemide po 80 mg daily starting 12/3  Goal of Therapy:  Electrolytes WNL  Plan:   Add 20 mEq/L to IVMF NS and continue at 50 mL/hr  Give an additional 40 mEq oral KCl  BMP in am  Dallie Piles ,PharmD Clinical Pharmacist 05/17/2019 8:03 AM

## 2019-05-18 DIAGNOSIS — R042 Hemoptysis: Secondary | ICD-10-CM | POA: Diagnosis not present

## 2019-05-18 LAB — CBC WITH DIFFERENTIAL/PLATELET
Abs Immature Granulocytes: 0.05 10*3/uL (ref 0.00–0.07)
Basophils Absolute: 0.1 10*3/uL (ref 0.0–0.1)
Basophils Relative: 1 %
Eosinophils Absolute: 0.2 10*3/uL (ref 0.0–0.5)
Eosinophils Relative: 2 %
HCT: 33 % — ABNORMAL LOW (ref 39.0–52.0)
Hemoglobin: 10.3 g/dL — ABNORMAL LOW (ref 13.0–17.0)
Immature Granulocytes: 1 %
Lymphocytes Relative: 22 %
Lymphs Abs: 1.6 10*3/uL (ref 0.7–4.0)
MCH: 27.5 pg (ref 26.0–34.0)
MCHC: 31.2 g/dL (ref 30.0–36.0)
MCV: 88 fL (ref 80.0–100.0)
Monocytes Absolute: 0.6 10*3/uL (ref 0.1–1.0)
Monocytes Relative: 9 %
Neutro Abs: 4.6 10*3/uL (ref 1.7–7.7)
Neutrophils Relative %: 65 %
Platelets: 207 10*3/uL (ref 150–400)
RBC: 3.75 MIL/uL — ABNORMAL LOW (ref 4.22–5.81)
RDW: 16.3 % — ABNORMAL HIGH (ref 11.5–15.5)
WBC: 7 10*3/uL (ref 4.0–10.5)
nRBC: 0 % (ref 0.0–0.2)

## 2019-05-18 LAB — GLUCOSE, CAPILLARY
Glucose-Capillary: 152 mg/dL — ABNORMAL HIGH (ref 70–99)
Glucose-Capillary: 165 mg/dL — ABNORMAL HIGH (ref 70–99)

## 2019-05-18 LAB — BASIC METABOLIC PANEL
Anion gap: 8 (ref 5–15)
BUN: 34 mg/dL — ABNORMAL HIGH (ref 8–23)
CO2: 23 mmol/L (ref 22–32)
Calcium: 8.4 mg/dL — ABNORMAL LOW (ref 8.9–10.3)
Chloride: 104 mmol/L (ref 98–111)
Creatinine, Ser: 1.73 mg/dL — ABNORMAL HIGH (ref 0.61–1.24)
GFR calc Af Amer: 47 mL/min — ABNORMAL LOW (ref >60)
GFR calc non Af Amer: 41 mL/min — ABNORMAL LOW (ref >60)
Glucose, Bld: 161 mg/dL — ABNORMAL HIGH (ref 70–99)
Potassium: 3.4 mmol/L — ABNORMAL LOW (ref 3.5–5.1)
Sodium: 135 mmol/L (ref 135–145)

## 2019-05-18 MED ORDER — POTASSIUM CHLORIDE CRYS ER 20 MEQ PO TBCR: 40.0000 meq | EXTENDED_RELEASE_TABLET | Freq: Once | ORAL | Status: DC

## 2019-05-18 NOTE — Consult Note (Signed)
[place] for Electrolyte Monitoring and Replacement   Recent Labs: Potassium (mmol/L)  Date Value  05/18/2019 3.4 (L)  03/06/2013 3.9   Magnesium (mg/dL)  Date Value  05/17/2019 2.0  12/11/2012 1.8   Calcium (mg/dL)  Date Value  05/18/2019 8.4 (L)   Calcium, Total (mg/dL)  Date Value  03/06/2013 9.3   Albumin (g/dL)  Date Value  05/12/2019 3.1 (L)  02/27/2018 3.6  03/06/2013 3.4   Sodium (mmol/L)  Date Value  05/18/2019 135  02/27/2018 141  03/06/2013 138   Corrected Ca: 9.7 mg/dL Add-On magnesium: 2.0 mg/dL  Assessment: 63 y.o. male with medical history significant of CHF, hypertension, OSA on CPAP, COPD, type 2 diabetes patient see anemia who presents with symptoms of hemoptysis. He is currently being administered IVMF NS at 50 mL/hr and furosemide po 80 mg daily starting 12/3  Goal of Therapy:  Electrolytes WNL  Plan:   Add 20 mEq/L to IVMF NS and continue at 50 mL/hr  Give an additional 40 mEq oral KCl  BMP in am  Oswald Hillock ,PharmD, BCPS Clinical Pharmacist 05/18/2019 9:37 AM

## 2019-05-18 NOTE — Discharge Summary (Signed)
Physician Discharge Summary  Larry Callahan HUT:[id number] DOB: [birth year] DOA: 05/15/2019  PCP: [name], NP  Admit date: 05/15/2019 Discharge date: 05/18/2019  Admitted From: Home Disposition:  Home  Recommendations for Outpatient Follow-up:  1. Follow up with PCP in 1-2 weeks 2. Follow up with pulmonologist Humphrey Rolls) as directed  Home Health:NO Equipment/Devices:NO  Discharge Condition:Stable CODE STATUS:FULL Diet recommendation: Heart Healthy  Brief/Interim Summary: Patient is a 63 year old male who presents for recurrent hemoptysis.  Patient had recent admission where the work-up for hemoptysis was unrevealing.  Patient was seen by pulmonary on previous admission who recommended referral to outpatient for consideration for outpatient bronchoscopy.  Patient was scheduled to present to the pulmonary office 12 3 however had recurrent hemoptysis and presented to the ED instead.  On presentation patient is hemodynamically stable.  He does have historical documentation significant for hemoglobin of 9 however hemoglobin on presentation was 11.7.  Pulmonary was consulted with recommendations to reach out to ENT service.  ENT performed flexible laryngoscopy on 12 3 with no evidence of mass or intranasal bleeding in the upper respiratory tract.  12/4: Plan for bronchoscopy today.  No further bleeding episodes  12/5: Bronchoscopy normal.  No evidence of bleeding.  Discussed with pulmonologist.  Patient small-volume hemoptysis has resolved.  Hemoglobin stable.  Unclear source of blood loss however upper and lower respiratory tract essentially limited as etiologies.  Is possible that patient has a bleed in the upper GI tract however recent EGD and colonoscopy did not reveal any bleeding.  Patient is stable for discharge at this time.  Will be discharged home.  All post discharge instruction given clearly to patient at bedside.  Patient expressed understanding.  Discharge Diagnoses:   Principal Problem:   Hemoptysis Active Problems:   Obstructive sleep apnea, adult   Chronic obstructive pulmonary disease, unspecified (HCC)   Essential hypertension   Type 2 diabetes mellitus with hyperglycemia (HCC)   CHF (congestive heart failure) (HCC)   Acute kidney injury superimposed on chronic kidney disease [facility])    Discharge Instructions  Discharge Instructions    Diet - low sodium heart healthy   Complete by: As directed    Increase activity slowly   Complete by: As directed      Allergies as of 05/18/2019      Reactions   Penicillins Rash      Medication List    STOP taking these medications   aspirin EC 81 MG tablet     TAKE these medications   acetaminophen 325 MG tablet Commonly known as: TYLENOL Take 2 tablets (650 mg total) by mouth every 4 (four) hours as needed for headache or mild pain.   albuterol 108 (90 Base) MCG/ACT inhaler Commonly known as: VENTOLIN HFA Inhale 2 puffs into the lungs every 6 (six) hours as needed for wheezing or shortness of breath.   amLODipine 10 MG tablet Commonly known as: NORVASC Take 1 tablet (10 mg total) by mouth daily.   clarithromycin 500 MG tablet Commonly known as: BIAXIN Take 1 tablet (500 mg total) by mouth every 12 (twelve) hours for 7 days.   clobetasol cream 0.05 % Commonly known as: TEMOVATE Apply 1 application topically 2 (two) times daily.   ferrous gluconate 324 MG tablet Commonly known as: FERGON Take 1 tablet (324 mg total) by mouth daily with breakfast.   ferrous sulfate 325 (65 FE) MG EC tablet Take 1 tablet (325 mg total) by mouth 2 (two) times daily with a meal.  Fluticasone-Salmeterol 250-50 MCG/DOSE Aepb Commonly known as: Advair Diskus Inhale 1 puff into the lungs 2 (two) times daily.   furosemide 80 MG tablet Commonly known as: Lasix Take 1 tablet (80 mg total) by mouth daily.   gemfibrozil 600 MG tablet Commonly known as: LOPID Take 1 tablet (600 mg total) by mouth 2 (two)  times daily with a meal.   glimepiride 2 MG tablet Commonly known as: AMARYL Take 1 tablet (2 mg total) by mouth daily before breakfast.   hydrALAZINE 100 MG tablet Commonly known as: APRESOLINE Take 1 tablet (100 mg total) by mouth 3 (three) times daily.   ipratropium-albuterol 0.5-2.5 (3) MG/3ML Soln Commonly known as: DUONEB Take 3 mLs by nebulization every 6 (six) hours as needed.   isosorbide mononitrate 30 MG 24 hr tablet Commonly known as: IMDUR Take 1 tablet (30 mg total) by mouth 2 (two) times daily.   loratadine 10 MG tablet Commonly known as: CLARITIN Take 1 tablet (10 mg total) by mouth daily.   metoprolol succinate 50 MG 24 hr tablet Commonly known as: TOPROL-XL Take 1 tablet (50 mg total) by mouth at bedtime. Take with or immediately following a meal.   nicotine 21 mg/24hr patch Commonly known as: NICODERM CQ - dosed in mg/24 hours Place 1 patch (21 mg total) onto the skin daily.   NovoFine 32G X 6 MM Misc Generic drug: Insulin Pen Needle To use with [place] injections daily   omeprazole 40 MG capsule Commonly known as: PRILOSEC Take 1 capsule (40 mg total) by mouth daily.   predniSONE 20 MG tablet Commonly known as: DELTASONE Take 1 tablet (20 mg total) by mouth daily.   Spiriva HandiHaler 18 MCG inhalation capsule Generic drug: tiotropium Place 1 capsule (18 mcg total) into inhaler and inhale daily.   Toujeo SoloStar 300 UNIT/ML Sopn Generic drug: Insulin Glargine (1 Unit Dial) Inject 26 Units into the skin every evening.   triamcinolone ointment 0.1 % Commonly known as: KENALOG Apply 1 application topically 2 (two) times daily.   True Metrix Air Glucose Meter Devi 1 Device by Does not apply route 2 (two) times daily.   True Metrix Blood Glucose Test test strip Generic drug: glucose blood Use as instructed twice daily diag E11.65   True Metrix Level 1 Low Soln Use as directed   TRUEplus Lancets 33G Misc Use as directed twice daily diag  E11.65      Follow-up Information    [facility] Follow up on 05/30/2019.   Specialty: Cardiology Why: at 9:30am. Enter through the [place] entrance Contact information: [place]         Allergies  Allergen Reactions  . Penicillins Rash    Consultations:  Pulmonology- [facility]  Otolaryngology- [place]   Procedures/Studies: Dg Chest 2 View  Result Date: 05/16/2019 CLINICAL DATA:  Hemoptysis EXAM: CHEST - 2 VIEW COMPARISON:  May 09, 2019 FINDINGS: There is mild cardiomegaly. Aortic knob calcifications. Both lungs are clear. No acute osseous abnormality. IMPRESSION: No active cardiopulmonary disease. Electronically Signed   By: Prudencio Pair M.D.   On: 05/16/2019 00:35   Dg Chest 2 View  Result Date: 05/09/2019 CLINICAL DATA:  Spitting up blood with lower extremity edema and generalized chest and upper abdominal pain since this morning. EXAM: CHEST - 2 VIEW COMPARISON:  04/24/2019 FINDINGS: Lungs are adequately inflated and otherwise clear. Cardiomediastinal silhouette and remainder of the exam is unchanged.  IMPRESSION: No active cardiopulmonary disease. Electronically Signed   By: Marin Olp M.D.   On: 05/09/2019 09:48   Dg Chest 2 View  Result Date: 04/24/2019 CLINICAL DATA:  complaints of shortness of breath, fatigue since this morning. Pt here for same recently, admission due to CHF. HTN, CHF, DM, former smoker EXAM: CHEST - 2 VIEW COMPARISON:  04/10/2019 FINDINGS: Mild central pulmonary vascular congestion and mild diffuse interstitial prominent, increased since previous. Heart size upper limits normal. Aortic Atherosclerosis (ICD10-170.0). No effusion. Anterior vertebral endplate spurring at multiple levels in the lower thoracic spine. IMPRESSION: Worsening central pulmonary vascular congestion and interstitial edema. Electronically Signed   By: Lucrezia Europe  M.D.   On: 04/24/2019 12:25   Ct Head Wo Contrast  Result Date: 04/29/2019 CLINICAL DATA:  Syncopal episode today with trauma to the right side of the head EXAM: CT HEAD WITHOUT CONTRAST TECHNIQUE: Contiguous axial images were obtained from the base of the skull through the vertex without intravenous contrast. COMPARISON:  03/26/2019 FINDINGS: Brain: The brain shows a normal appearance without evidence of malformation, atrophy, old or acute small or large vessel infarction, mass lesion, hemorrhage, hydrocephalus or extra-axial collection. Vascular: No hyperdense vessel. No evidence of atherosclerotic calcification. Skull: Normal.  No traumatic finding.  No focal bone lesion. Sinuses/Orbits: Sinuses are clear. Orbits appear normal. Mastoids are clear. Other: None significant IMPRESSION: Normal head CT Electronically Signed   By: Nelson Chimes M.D.   On: 04/29/2019 16:59   Ct Chest Wo Contrast  Result Date: 05/09/2019 CLINICAL DATA:  Nonproductive cough, hemoptysis, bilateral leg swelling EXAM: CT CHEST WITHOUT CONTRAST TECHNIQUE: Multidetector CT imaging of the chest was performed following the standard protocol without IV contrast. COMPARISON:  Chest radiograph dated 05/09/2019. CTA chest dated 02/20/2019. FINDINGS: Cardiovascular: The heart is normal in size. No pericardial effusion. No evidence of thoracic aortic aneurysm. Atherosclerotic calcifications of the aortic arch. Coronary atherosclerosis of the LAD. Mediastinum/Nodes: No suspicious mediastinal lymphadenopathy. Visualized thyroid is unremarkable. Lungs/Pleura: No suspicious pulmonary nodules. No focal consolidation. Mild centrilobular emphysematous changes in the bilateral upper lobes. No pleural effusion or pneumothorax. Upper Abdomen: Visualized upper abdomen is notable for bilateral renal cysts measuring up to 7.5 cm in the left upper kidney, incompletely visualized. Musculoskeletal: Degenerative changes of the visualized thoracolumbar spine.  IMPRESSION: No evidence of acute cardiopulmonary disease. Aortic Atherosclerosis (ICD10-I70.0) and Emphysema (ICD10-J43.9). Electronically Signed   By: Julian Hy M.D.   On: 05/09/2019 11:50   Nm Pulmonary Perf And Vent  Result Date: 05/11/2019 CLINICAL DATA:  Shortness of breath and hemoptysis EXAM: NUCLEAR MEDICINE VENTILATION - PERFUSION LUNG SCAN VIEWS: Anterior, posterior, left lateral, right lateral, RPO, LPO, RAO, LAO-ventilation and perfusion RADIOPHARMACEUTICALS:  31.87 mCi of Tc-40m DTPA aerosol inhalation and 4.19 mCi Tc23m MAA IV COMPARISON:  Chest radiograph May 08, 2016 FINDINGS: Ventilation: Radiotracer uptake is homogeneous and symmetric bilaterally. No focal ventilation defects are evident. Perfusion: Radiotracer uptake is homogeneous and symmetric bilaterally. No perfusion defects evident. IMPRESSION: No appreciable ventilation or perfusion defects. Very low probability of pulmonary embolus. Electronically Signed   By: Lowella Grip III M.D.   On: 05/11/2019 14:32   Ct Renal Stone Study  Result Date: 04/24/2019 CLINICAL DATA:  Left flank pain. Generalized abdominal pain. Bloody stools. EXAM: CT ABDOMEN AND PELVIS WITHOUT CONTRAST TECHNIQUE: Multidetector CT imaging of the abdomen and pelvis was performed following the standard protocol without IV contrast. COMPARISON:  01/31/2014 CT abdomen/pelvis. FINDINGS: Lower chest: No significant pulmonary nodules or acute consolidative airspace  disease. Hepatobiliary: Normal liver size. No liver mass. Cholecystectomy. No biliary ductal dilatation. Pancreas: Normal, with no mass or duct dilation. Spleen: Normal size spleen. Granulomatous calcifications throughout the spleen. No splenic mass. Adrenals/Urinary Tract: Normal right adrenal. 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 Left nephrectomy with no mass or fluid  collection in the left nephrectomy bed. No right hydronephrosis. No right renal stones. Multiple simple right renal cysts, largest 7.7 cm in the posterior lower right kidney. Normal caliber right ureter. No right ureteral stones. Normal bladder with no bladder stones. Stomach/Bowel: Normal non-distended stomach. Normal caliber small bowel with no small bowel wall thickening. Normal appendix. Marked left colonic diverticulosis, with no large bowel wall thickening or significant pericolonic fat stranding. Vascular/Lymphatic: Atherosclerotic nonaneurysmal abdominal aorta. No pathologically enlarged lymph nodes in the abdomen or pelvis. Reproductive: Mildly enlarged prostate. Other: No pneumoperitoneum, ascites or focal fluid collection. Moderate to large fat containing umbilical hernia, slightly increased from prior. Chronic diastasis of posterior left lateral abdominal wall. Musculoskeletal: No aggressive appearing focal osseous lesions. Moderate thoracolumbar spondylosis. IMPRESSION: 1. No acute abnormality. No evidence of bowel obstruction or acute bowel inflammation. Marked sigmoid diverticulosis, with no evidence of acute diverticulitis. 2. No right hydronephrosis.  No urolithiasis. 3. 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 Given the large size and continued growth, surgical consultation may be considered. 4. Left nephrectomy. 5. Mild prostatomegaly. 6. Moderate to large fat containing umbilical hernia, slightly increased. 7.  Aortic Atherosclerosis (ICD10-I70.0). Electronically Signed   By: Ilona Sorrel M.D.   On: 04/24/2019 16:17   Dg Hip Unilat With Pelvis 2-3 Views Left  Result Date: 04/29/2019 CLINICAL DATA:  Fall EXAM: DG HIP (WITH OR WITHOUT PELVIS) 2-3V LEFT COMPARISON:  None. FINDINGS: Anatomic alignment is maintained. There is no acute fracture. Mild changes of osteoarthritis. IMPRESSION: No acute fracture.  Electronically Signed   By: Macy Mis M.D.   On: 04/29/2019 12:06   Dg Hip Unilat With Pelvis 2-3 Views Right  Result Date: 04/29/2019 CLINICAL DATA:  Fall EXAM: DG HIP (WITH OR WITHOUT PELVIS) 2-3V RIGHT COMPARISON:  2011 FINDINGS: Alignment is anatomic. There is no acute fracture. Changes of osteoarthritis are present similar to 2011. IMPRESSION: No acute fracture at the right hip. Electronically Signed   By: Macy Mis M.D.   On: 04/29/2019 12:03    (Echo, Carotid, EGD, Colonoscopy, ERCP)    Subjective:   Discharge Exam: Vitals:   05/17/19 2200 05/18/19 0447  BP:  132/70  Pulse:  65  Resp: 16 18  Temp:  98.4 F (36.9 C)  SpO2:  96%   Vitals:   05/17/19 1631 05/17/19 2049 05/17/19 2200 05/18/19 0447  BP: (!) 143/75 (!) 146/73  132/70  Pulse: 67 70  65  Resp: 16  16 18   Temp: 98 F (36.7 C)   98.4 F (36.9 C)  TempSrc: Oral   Oral  SpO2: 96% 92%  96%  Weight:      Height:        General: Pt is alert, awake, not in acute distress Cardiovascular: RRR, S1/S2 +, no rubs, no gallops Respiratory: CTA bilaterally, no wheezing, no rhonchi Abdominal: Soft, NT, ND, bowel sounds + Extremities: no edema, no cyanosis    The results of significant diagnostics from this hospitalization (including imaging,  microbiology, ancillary and laboratory) are listed below for reference.     Microbiology: Recent Results (from the past 240 hour(s))  SARS CORONAVIRUS 2 (TAT 6-24 HRS) Nasopharyngeal Nasopharyngeal Swab     Status: None   Collection Time: 05/16/19  4:02 AM   Specimen: Nasopharyngeal Swab  Result Value Ref Range Status   SARS Coronavirus 2 NEGATIVE NEGATIVE Final    Comment: (NOTE) SARS-CoV-2 target nucleic acids are NOT DETECTED. The SARS-CoV-2 RNA is generally detectable in upper and lower respiratory specimens during the acute phase of infection. Negative results do not preclude SARS-CoV-2 infection, do not rule out co-infections with other pathogens, and  should not be used as the sole basis for treatment or other patient management decisions. Negative results must be combined with clinical observations, patient history, and epidemiological information. The expected result is Negative. Fact Sheet for Patients: SugarRoll.be Fact Sheet for Healthcare Providers: https://www.woods-mathews.com/ This test is not yet approved or cleared by the [place] FDA and  has been authorized for detection and/or diagnosis of SARS-CoV-2 by FDA under an Emergency Use Authorization (EUA). This EUA will remain  in effect (meaning this test can be used) for the duration of the COVID-19 declaration under Section 56 4(b)(1) of the Act, 21 U.S.C. section 360bbb-3(b)(1), unless the authorization is terminated or revoked sooner. Performed at [facility], [place] [address], [place], [place] [address]      Labs: BNP (last 3 results) Recent Labs    04/09/19 1432 04/24/19 1122 05/10/19 1640  BNP 115.0* 88.0 65.6   Basic Metabolic Panel: Recent Labs  Lab 05/12/19 0533 05/13/19 0542 05/15/19 1515 05/16/19 0630 05/17/19 0729 05/17/19 0830 05/18/19 0736  NA 139  --  138 143 138  --  135  K 3.2*  --  3.4* 3.1* 3.1*  --  3.4*  CL 105  --  102 108 104  --  104  CO2 21*  --  22 24 24   --  23  GLUCOSE 73  --  247* 131* 205*  --  161*  BUN 47*  --  61* 51* 40*  --  34*  CREATININE 2.15*  --  2.50* 1.99* 1.83*  --  1.73*  CALCIUM 9.0  --  9.3 9.0 8.3*  --  8.4*  MG 2.2 2.3  --   --   --  2.0  --    Liver Function Tests: Recent Labs  Lab 05/12/19 0533  AST 18  ALT 18  ALKPHOS 55  BILITOT 0.5  PROT 6.6  ALBUMIN 3.1*   No results for input(s): LIPASE, AMYLASE in the last 168 hours. No results for input(s): AMMONIA in the last 168 hours. CBC: Recent Labs  Lab 05/13/19 0542 05/15/19 1515 05/16/19 0630 05/17/19 0729 05/18/19 0736  WBC 9.6 10.4 8.5 7.3 7.0  NEUTROABS  --   --   --  4.9 4.6  HGB  10.5* 11.3* 11.2* 10.5* 10.3*  HCT 32.9* 34.2* 34.4* 32.4* 33.0*  MCV 85.9 84.4 84.1 85.0 88.0  PLT 258 253 231 214 207   Cardiac Enzymes: No results for input(s): CKTOTAL, CKMB, CKMBINDEX, TROPONINI in the last 168 hours. BNP: Invalid input(s): POCBNP CBG: Recent Labs  Lab 05/17/19 1440 05/17/19 1528 05/17/19 1637 05/18/19 0021 05/18/19 0738  GLUCAP 64* 119* 102* 165* 152*   D-Dimer No results for input(s): DDIMER in the last 72 hours. Hgb A1c No results for input(s): HGBA1C in the last 72 hours. Lipid Profile No results for input(s): CHOL,  HDL, LDLCALC, TRIG, CHOLHDL, LDLDIRECT in the last 72 hours. Thyroid function studies No results for input(s): TSH, T4TOTAL, T3FREE, THYROIDAB in the last 72 hours.  Invalid input(s): FREET3 Anemia work up No results for input(s): VITAMINB12, FOLATE, FERRITIN, TIBC, IRON, RETICCTPCT in the last 72 hours. Urinalysis    Component Value Date/Time   COLORURINE YELLOW (A) 04/23/2019 1852   APPEARANCEUR CLEAR (A) 04/23/2019 1852   APPEARANCEUR Cloudy (A) 11/23/2018 1103   LABSPEC 1.011 04/23/2019 1852   LABSPEC 1.005 03/06/2013 0929   PHURINE 5.0 04/23/2019 1852   GLUCOSEU NEGATIVE 04/23/2019 1852   GLUCOSEU Negative 03/06/2013 0929   HGBUR NEGATIVE 04/23/2019 1852   BILIRUBINUR NEGATIVE 04/23/2019 1852   BILIRUBINUR negative 02/20/2019 1404   BILIRUBINUR Negative 11/23/2018 1103   BILIRUBINUR Negative 03/06/2013 0929   KETONESUR NEGATIVE 04/23/2019 1852   PROTEINUR 100 (A) 04/23/2019 1852   UROBILINOGEN 0.2 02/20/2019 1404   NITRITE NEGATIVE 04/23/2019 1852   LEUKOCYTESUR NEGATIVE 04/23/2019 1852   LEUKOCYTESUR Negative 03/06/2013 0929   Sepsis Labs Invalid input(s): PROCALCITONIN,  WBC,  LACTICIDVEN Microbiology Recent Results (from the past 240 hour(s))  SARS CORONAVIRUS 2 (TAT 6-24 HRS) Nasopharyngeal Nasopharyngeal Swab     Status: None   Collection Time: 05/16/19  4:02 AM   Specimen: Nasopharyngeal Swab  Result Value  Ref Range Status   SARS Coronavirus 2 NEGATIVE NEGATIVE Final    Comment: (NOTE) SARS-CoV-2 target nucleic acids are NOT DETECTED. The SARS-CoV-2 RNA is generally detectable in upper and lower respiratory specimens during the acute phase of infection. Negative results do not preclude SARS-CoV-2 infection, do not rule out co-infections with other pathogens, and should not be used as the sole basis for treatment or other patient management decisions. Negative results must be combined with clinical observations, patient history, and epidemiological information. The expected result is Negative. Fact Sheet for Patients: SugarRoll.be Fact Sheet for Healthcare Providers: https://www.woods-mathews.com/ This test is not yet approved or cleared by the [place] FDA and  has been authorized for detection and/or diagnosis of SARS-CoV-2 by FDA under an Emergency Use Authorization (EUA). This EUA will remain  in effect (meaning this test can be used) for the duration of the COVID-19 declaration under Section 56 4(b)(1) of the Act, 21 U.S.C. section 360bbb-3(b)(1), unless the authorization is terminated or revoked sooner. Performed at [facility], [place] [address], [place], [place] [address]      Time coordinating discharge: Over 30 minutes  SIGNED:   Sidney Ace, MD  [facility] 05/18/2019, 3:55 PM Pager [phone]  If 7PM-7AM, please contact night-coverage [email or web address] Password [email or web address]

## 2019-05-18 NOTE — Progress Notes (Signed)
Larry Callahan to be D/C'd home per MD order.  Discussed prescriptions and follow up appointments with the patient. Prescriptions given to patient, medication list explained in detail. Pt verbalized understanding.  Allergies as of 05/18/2019      Reactions   Penicillins Rash      Medication List    STOP taking these medications   aspirin EC 81 MG tablet     TAKE these medications   acetaminophen 325 MG tablet Commonly known as: TYLENOL Take 2 tablets (650 mg total) by mouth every 4 (four) hours as needed for headache or mild pain.   albuterol 108 (90 Base) MCG/ACT inhaler Commonly known as: VENTOLIN HFA Inhale 2 puffs into the lungs every 6 (six) hours as needed for wheezing or shortness of breath.   amLODipine 10 MG tablet Commonly known as: NORVASC Take 1 tablet (10 mg total) by mouth daily.   clarithromycin 500 MG tablet Commonly known as: BIAXIN Take 1 tablet (500 mg total) by mouth every 12 (twelve) hours for 7 days.   clobetasol cream 0.05 % Commonly known as: TEMOVATE Apply 1 application topically 2 (two) times daily.   ferrous gluconate 324 MG tablet Commonly known as: FERGON Take 1 tablet (324 mg total) by mouth daily with breakfast.   ferrous sulfate 325 (65 FE) MG EC tablet Take 1 tablet (325 mg total) by mouth 2 (two) times daily with a meal.   Fluticasone-Salmeterol 250-50 MCG/DOSE Aepb Commonly known as: Advair Diskus Inhale 1 puff into the lungs 2 (two) times daily.   furosemide 80 MG tablet Commonly known as: Lasix Take 1 tablet (80 mg total) by mouth daily.   gemfibrozil 600 MG tablet Commonly known as: LOPID Take 1 tablet (600 mg total) by mouth 2 (two) times daily with a meal.   glimepiride 2 MG tablet Commonly known as: AMARYL Take 1 tablet (2 mg total) by mouth daily before breakfast.   hydrALAZINE 100 MG tablet Commonly known as: APRESOLINE Take 1 tablet (100 mg total) by mouth 3 (three) times daily.   ipratropium-albuterol  0.5-2.5 (3) MG/3ML Soln Commonly known as: DUONEB Take 3 mLs by nebulization every 6 (six) hours as needed.   isosorbide mononitrate 30 MG 24 hr tablet Commonly known as: IMDUR Take 1 tablet (30 mg total) by mouth 2 (two) times daily.   loratadine 10 MG tablet Commonly known as: CLARITIN Take 1 tablet (10 mg total) by mouth daily.   metoprolol succinate 50 MG 24 hr tablet Commonly known as: TOPROL-XL Take 1 tablet (50 mg total) by mouth at bedtime. Take with or immediately following a meal.   nicotine 21 mg/24hr patch Commonly known as: NICODERM CQ - dosed in mg/24 hours Place 1 patch (21 mg total) onto the skin daily.   NovoFine 32G X 6 MM Misc Generic drug: Insulin Pen Needle To use with [place] injections daily   omeprazole 40 MG capsule Commonly known as: PRILOSEC Take 1 capsule (40 mg total) by mouth daily.   predniSONE 20 MG tablet Commonly known as: DELTASONE Take 1 tablet (20 mg total) by mouth daily.   Spiriva HandiHaler 18 MCG inhalation capsule Generic drug: tiotropium Place 1 capsule (18 mcg total) into inhaler and inhale daily.   Toujeo SoloStar 300 UNIT/ML Sopn Generic drug: Insulin Glargine (1 Unit Dial) Inject 26 Units into the skin every evening.   triamcinolone ointment 0.1 % Commonly known as: KENALOG Apply 1 application topically 2 (two) times daily.   True Metrix Air Glucose  Meter Devi 1 Device by Does not apply route 2 (two) times daily.   True Metrix Blood Glucose Test test strip Generic drug: glucose blood Use as instructed twice daily diag E11.65   True Metrix Level 1 Low Soln Use as directed   TRUEplus Lancets 33G Misc Use as directed twice daily diag E11.65       Vitals:   05/17/19 2200 05/18/19 0447  BP:  132/70  Pulse:  65  Resp: 16 18  Temp:  98.4 F (36.9 C)  SpO2:  96%    Skin clean, dry and intact without evidence of skin break down, no evidence of skin tears noted. IV catheter discontinued intact. Site without signs  and symptoms of complications. Dressing and pressure applied. Pt denies pain at this time. No complaints noted.  An After Visit Summary was printed and given to the patient. Patient escorted via [place], and D/C home via private auto.  Chuck Hint RN [facility] [employer]

## 2019-05-18 NOTE — Care Management Obs Status (Signed)
[place] NOTIFICATION   Patient Details  Name: Byren Pankow MRN: [id number] Date of Birth: 1955/12/06   Medicare Observation Status Notification Given:  Yes    Katrina Stack, RN 05/18/2019, 9:20 AM

## 2019-05-18 NOTE — Care Management CC44 (Signed)
Condition Code 44 Documentation Completed  Patient Details  Name: Larry Callahan MRN: [id number] Date of Birth: 06/07/1956   Condition Code 44 given:  Yes Patient signature on Condition Code 44 notice:  Yes Documentation of 2 MD's agreement:  Yes Code 44 added to claim:  Yes    Katrina Stack, RN 05/18/2019, 9:21 AM

## 2019-05-19 ENCOUNTER — Emergency Department: Payer: Medicare PPO

## 2019-05-19 ENCOUNTER — Inpatient Hospital Stay
Admission: EM | Admit: 2019-05-19 | Discharge: 2019-05-24 | DRG: 193 | Disposition: A | Discharge status: Home / Self Care | Payer: Medicare PPO | Attending: Internal Medicine | Admitting: Internal Medicine

## 2019-05-19 ENCOUNTER — Other Ambulatory Visit: Payer: Self-pay

## 2019-05-19 ENCOUNTER — Encounter: Payer: Self-pay | Admitting: Intensive Care

## 2019-05-19 DIAGNOSIS — R079 Chest pain, unspecified: Secondary | ICD-10-CM | POA: Diagnosis not present

## 2019-05-19 DIAGNOSIS — E1165 Type 2 diabetes mellitus with hyperglycemia: Secondary | ICD-10-CM | POA: Diagnosis present

## 2019-05-19 DIAGNOSIS — N2581 Secondary hyperparathyroidism of renal origin: Secondary | ICD-10-CM | POA: Diagnosis present

## 2019-05-19 DIAGNOSIS — Z85528 Personal history of other malignant neoplasm of kidney: Secondary | ICD-10-CM

## 2019-05-19 DIAGNOSIS — J189 Pneumonia, unspecified organism: Secondary | ICD-10-CM | POA: Diagnosis present

## 2019-05-19 DIAGNOSIS — E11649 Type 2 diabetes mellitus with hypoglycemia without coma: Secondary | ICD-10-CM | POA: Diagnosis not present

## 2019-05-19 DIAGNOSIS — Z6837 Body mass index (BMI) 37.0-37.9, adult: Secondary | ICD-10-CM

## 2019-05-19 DIAGNOSIS — I130 Hypertensive heart and chronic kidney disease with heart failure and stage 1 through stage 4 chronic kidney disease, or unspecified chronic kidney disease: Secondary | ICD-10-CM | POA: Diagnosis present

## 2019-05-19 DIAGNOSIS — D509 Iron deficiency anemia, unspecified: Secondary | ICD-10-CM | POA: Diagnosis present

## 2019-05-19 DIAGNOSIS — D631 Anemia in chronic kidney disease: Secondary | ICD-10-CM | POA: Diagnosis present

## 2019-05-19 DIAGNOSIS — I5033 Acute on chronic diastolic (congestive) heart failure: Secondary | ICD-10-CM | POA: Diagnosis present

## 2019-05-19 DIAGNOSIS — Z7982 Long term (current) use of aspirin: Secondary | ICD-10-CM

## 2019-05-19 DIAGNOSIS — N179 Acute kidney failure, unspecified: Secondary | ICD-10-CM | POA: Diagnosis present

## 2019-05-19 DIAGNOSIS — K2100 Gastro-esophageal reflux disease with esophagitis, without bleeding: Secondary | ICD-10-CM | POA: Diagnosis present

## 2019-05-19 DIAGNOSIS — I2510 Atherosclerotic heart disease of native coronary artery without angina pectoris: Secondary | ICD-10-CM | POA: Diagnosis present

## 2019-05-19 DIAGNOSIS — Z880 Allergy status to penicillin: Secondary | ICD-10-CM

## 2019-05-19 DIAGNOSIS — E1121 Type 2 diabetes mellitus with diabetic nephropathy: Secondary | ICD-10-CM | POA: Diagnosis not present

## 2019-05-19 DIAGNOSIS — Z20828 Contact with and (suspected) exposure to other viral communicable diseases: Secondary | ICD-10-CM | POA: Diagnosis present

## 2019-05-19 DIAGNOSIS — E1122 Type 2 diabetes mellitus with diabetic chronic kidney disease: Secondary | ICD-10-CM | POA: Diagnosis present

## 2019-05-19 DIAGNOSIS — N183 Chronic kidney disease, stage 3 unspecified: Secondary | ICD-10-CM | POA: Diagnosis not present

## 2019-05-19 DIAGNOSIS — E782 Mixed hyperlipidemia: Secondary | ICD-10-CM | POA: Diagnosis present

## 2019-05-19 DIAGNOSIS — E119 Type 2 diabetes mellitus without complications: Secondary | ICD-10-CM

## 2019-05-19 DIAGNOSIS — G4733 Obstructive sleep apnea (adult) (pediatric): Secondary | ICD-10-CM | POA: Diagnosis present

## 2019-05-19 DIAGNOSIS — I272 Pulmonary hypertension, unspecified: Secondary | ICD-10-CM | POA: Diagnosis present

## 2019-05-19 DIAGNOSIS — F17220 Nicotine dependence, chewing tobacco, uncomplicated: Secondary | ICD-10-CM | POA: Diagnosis present

## 2019-05-19 DIAGNOSIS — E8770 Fluid overload, unspecified: Secondary | ICD-10-CM

## 2019-05-19 DIAGNOSIS — Z7952 Long term (current) use of systemic steroids: Secondary | ICD-10-CM

## 2019-05-19 DIAGNOSIS — I10 Essential (primary) hypertension: Secondary | ICD-10-CM | POA: Diagnosis not present

## 2019-05-19 DIAGNOSIS — E1142 Type 2 diabetes mellitus with diabetic polyneuropathy: Secondary | ICD-10-CM | POA: Diagnosis present

## 2019-05-19 DIAGNOSIS — Z8249 Family history of ischemic heart disease and other diseases of the circulatory system: Secondary | ICD-10-CM

## 2019-05-19 DIAGNOSIS — R531 Weakness: Secondary | ICD-10-CM | POA: Diagnosis present

## 2019-05-19 DIAGNOSIS — Z905 Acquired absence of kidney: Secondary | ICD-10-CM

## 2019-05-19 DIAGNOSIS — R042 Hemoptysis: Secondary | ICD-10-CM | POA: Diagnosis present

## 2019-05-19 DIAGNOSIS — Z7951 Long term (current) use of inhaled steroids: Secondary | ICD-10-CM | POA: Diagnosis not present

## 2019-05-19 DIAGNOSIS — N184 Chronic kidney disease, stage 4 (severe): Secondary | ICD-10-CM

## 2019-05-19 DIAGNOSIS — R0602 Shortness of breath: Secondary | ICD-10-CM | POA: Diagnosis not present

## 2019-05-19 DIAGNOSIS — N1832 Chronic kidney disease, stage 3b: Secondary | ICD-10-CM | POA: Diagnosis present

## 2019-05-19 DIAGNOSIS — J181 Lobar pneumonia, unspecified organism: Secondary | ICD-10-CM | POA: Diagnosis present

## 2019-05-19 DIAGNOSIS — Z9114 Patient's other noncompliance with medication regimen: Secondary | ICD-10-CM

## 2019-05-19 DIAGNOSIS — R0781 Pleurodynia: Secondary | ICD-10-CM

## 2019-05-19 DIAGNOSIS — Z801 Family history of malignant neoplasm of trachea, bronchus and lung: Secondary | ICD-10-CM

## 2019-05-19 DIAGNOSIS — Z9111 Patient's noncompliance with dietary regimen: Secondary | ICD-10-CM

## 2019-05-19 DIAGNOSIS — E876 Hypokalemia: Secondary | ICD-10-CM | POA: Diagnosis not present

## 2019-05-19 DIAGNOSIS — Z79899 Other long term (current) drug therapy: Secondary | ICD-10-CM

## 2019-05-19 DIAGNOSIS — Z789 Other specified health status: Secondary | ICD-10-CM

## 2019-05-19 DIAGNOSIS — Z794 Long term (current) use of insulin: Secondary | ICD-10-CM

## 2019-05-19 DIAGNOSIS — J441 Chronic obstructive pulmonary disease with (acute) exacerbation: Secondary | ICD-10-CM | POA: Diagnosis present

## 2019-05-19 DIAGNOSIS — J449 Chronic obstructive pulmonary disease, unspecified: Secondary | ICD-10-CM

## 2019-05-19 DIAGNOSIS — Z833 Family history of diabetes mellitus: Secondary | ICD-10-CM

## 2019-05-19 DIAGNOSIS — I5032 Chronic diastolic (congestive) heart failure: Secondary | ICD-10-CM

## 2019-05-19 LAB — CBC
HCT: 35.2 % — ABNORMAL LOW (ref 39.0–52.0)
Hemoglobin: 11.1 g/dL — ABNORMAL LOW (ref 13.0–17.0)
MCH: 27.7 pg (ref 26.0–34.0)
MCHC: 31.5 g/dL (ref 30.0–36.0)
MCV: 87.8 fL (ref 80.0–100.0)
Platelets: 210 10*3/uL (ref 150–400)
RBC: 4.01 MIL/uL — ABNORMAL LOW (ref 4.22–5.81)
RDW: 16.2 % — ABNORMAL HIGH (ref 11.5–15.5)
WBC: 8 10*3/uL (ref 4.0–10.5)
nRBC: 0 % (ref 0.0–0.2)

## 2019-05-19 LAB — BRAIN NATRIURETIC PEPTIDE: B Natriuretic Peptide: 143 pg/mL — ABNORMAL HIGH (ref 0.0–100.0)

## 2019-05-19 LAB — POC SARS CORONAVIRUS 2 AG: SARS Coronavirus 2 Ag: NEGATIVE

## 2019-05-19 LAB — BASIC METABOLIC PANEL
Anion gap: 11 (ref 5–15)
BUN: 55 mg/dL — ABNORMAL HIGH (ref 8–23)
CO2: 22 mmol/L (ref 22–32)
Calcium: 8.9 mg/dL (ref 8.9–10.3)
Chloride: 106 mmol/L (ref 98–111)
Creatinine, Ser: 2.56 mg/dL — ABNORMAL HIGH (ref 0.61–1.24)
GFR calc Af Amer: 29 mL/min — ABNORMAL LOW (ref >60)
GFR calc non Af Amer: 25 mL/min — ABNORMAL LOW (ref >60)
Glucose, Bld: 204 mg/dL — ABNORMAL HIGH (ref 70–99)
Potassium: 3 mmol/L — ABNORMAL LOW (ref 3.5–5.1)
Sodium: 139 mmol/L (ref 135–145)

## 2019-05-19 LAB — TROPONIN I (HIGH SENSITIVITY)
Troponin I (High Sensitivity): 18 ng/L — ABNORMAL HIGH (ref <18)
Troponin I (High Sensitivity): 19 ng/L — ABNORMAL HIGH (ref <18)

## 2019-05-19 LAB — ACID FAST SMEAR (AFB, MYCOBACTERIA): Acid Fast Smear: NEGATIVE

## 2019-05-19 MED ORDER — SODIUM CHLORIDE 0.9 % IV SOLN
500.0000 mg | INTRAVENOUS | Status: DC
  Administered 2019-05-20 – 2019-05-21 (×3): 500 mg via INTRAVENOUS
  Filled 2019-05-19 (×6): qty 500

## 2019-05-19 MED ORDER — GEMFIBROZIL 600 MG PO TABS
600.0000 mg | ORAL_TABLET | Freq: Two times a day (BID) | ORAL | Status: DC
  Administered 2019-05-20 – 2019-05-24 (×9): 600 mg via ORAL
  Filled 2019-05-19 (×11): qty 1

## 2019-05-19 MED ORDER — PANTOPRAZOLE SODIUM 40 MG PO TBEC
40.0000 mg | DELAYED_RELEASE_TABLET | Freq: Every day | ORAL | Status: DC
  Administered 2019-05-20 – 2019-05-24 (×5): 40 mg via ORAL
  Filled 2019-05-19 (×5): qty 1

## 2019-05-19 MED ORDER — GUAIFENESIN ER 600 MG PO TB12
600.0000 mg | ORAL_TABLET | Freq: Two times a day (BID) | ORAL | Status: DC
  Administered 2019-05-19 – 2019-05-24 (×10): 600 mg via ORAL
  Filled 2019-05-19 (×10): qty 1

## 2019-05-19 MED ORDER — ISOSORBIDE MONONITRATE ER 30 MG PO TB24
30.0000 mg | ORAL_TABLET | Freq: Two times a day (BID) | ORAL | Status: DC
  Administered 2019-05-19 – 2019-05-24 (×10): 30 mg via ORAL
  Filled 2019-05-19 (×12): qty 1

## 2019-05-19 MED ORDER — INSULIN ASPART 100 UNIT/ML ~~LOC~~ SOLN: 0.0000 [IU] | Freq: Three times a day (TID) | SUBCUTANEOUS | Status: DC

## 2019-05-19 MED ORDER — INSULIN GLARGINE 100 UNIT/ML ~~LOC~~ SOLN
26.0000 [IU] | Freq: Every day | SUBCUTANEOUS | Status: DC
  Filled 2019-05-19: qty 0.26

## 2019-05-19 MED ORDER — ONDANSETRON HCL 4 MG PO TABS: 4.0000 mg | ORAL_TABLET | Freq: Four times a day (QID) | ORAL | Status: DC | PRN

## 2019-05-19 MED ORDER — LORATADINE 10 MG PO TABS
10.0000 mg | ORAL_TABLET | Freq: Every day | ORAL | Status: DC
  Administered 2019-05-19 – 2019-05-22 (×4): 10 mg via ORAL
  Filled 2019-05-19 (×4): qty 1

## 2019-05-19 MED ORDER — TIOTROPIUM BROMIDE MONOHYDRATE 18 MCG IN CAPS
18.0000 ug | ORAL_CAPSULE | Freq: Every day | RESPIRATORY_TRACT | Status: DC
  Administered 2019-05-19 – 2019-05-24 (×6): 18 ug via RESPIRATORY_TRACT
  Filled 2019-05-19 (×2): qty 5

## 2019-05-19 MED ORDER — INSULIN GLARGINE 100 UNIT/ML ~~LOC~~ SOLN
15.0000 [IU] | Freq: Every day | SUBCUTANEOUS | Status: DC
  Administered 2019-05-19 – 2019-05-21 (×3): 15 [IU] via SUBCUTANEOUS
  Filled 2019-05-19 (×4): qty 0.15

## 2019-05-19 MED ORDER — HYDRALAZINE HCL 50 MG PO TABS
100.0000 mg | ORAL_TABLET | Freq: Three times a day (TID) | ORAL | Status: DC
  Administered 2019-05-19 – 2019-05-24 (×14): 100 mg via ORAL
  Filled 2019-05-19 (×14): qty 2

## 2019-05-19 MED ORDER — HYDROCOD POLST-CPM POLST ER 10-8 MG/5ML PO SUER: 5.0000 mL | Freq: Two times a day (BID) | ORAL | Status: DC | PRN

## 2019-05-19 MED ORDER — AMLODIPINE BESYLATE 10 MG PO TABS
10.0000 mg | ORAL_TABLET | Freq: Every day | ORAL | Status: DC
  Administered 2019-05-19 – 2019-05-24 (×6): 10 mg via ORAL
  Filled 2019-05-19 (×3): qty 1
  Filled 2019-05-19 (×2): qty 2
  Filled 2019-05-19: qty 1

## 2019-05-19 MED ORDER — ALBUTEROL SULFATE (2.5 MG/3ML) 0.083% IN NEBU: 3.0000 mL | INHALATION_SOLUTION | Freq: Four times a day (QID) | RESPIRATORY_TRACT | Status: DC | PRN

## 2019-05-19 MED ORDER — ONDANSETRON HCL 4 MG/2ML IJ SOLN: 4.0000 mg | Freq: Four times a day (QID) | INTRAMUSCULAR | Status: DC | PRN

## 2019-05-19 MED ORDER — INSULIN ASPART 100 UNIT/ML ~~LOC~~ SOLN: 0.0000 [IU] | Freq: Every day | SUBCUTANEOUS | Status: DC

## 2019-05-19 MED ORDER — SODIUM CHLORIDE 0.9 % IV BOLUS
500.0000 mL | Freq: Once | INTRAVENOUS | Status: AC
  Administered 2019-05-19: 18:00:00 500 mL via INTRAVENOUS

## 2019-05-19 MED ORDER — TRAZODONE HCL 50 MG PO TABS
25.0000 mg | ORAL_TABLET | Freq: Every evening | ORAL | Status: DC | PRN
  Administered 2019-05-23: 25 mg via ORAL
  Filled 2019-05-19: qty 1

## 2019-05-19 MED ORDER — NITROGLYCERIN 0.4 MG SL SUBL: 0.4000 mg | SUBLINGUAL_TABLET | SUBLINGUAL | Status: DC | PRN

## 2019-05-19 MED ORDER — METOPROLOL SUCCINATE ER 50 MG PO TB24
50.0000 mg | ORAL_TABLET | Freq: Every day | ORAL | Status: DC
  Administered 2019-05-19 – 2019-05-23 (×5): 50 mg via ORAL
  Filled 2019-05-19: qty 1
  Filled 2019-05-19: qty 2
  Filled 2019-05-19 (×3): qty 1

## 2019-05-19 MED ORDER — ACETAMINOPHEN 650 MG RE SUPP: 650.0000 mg | Freq: Four times a day (QID) | RECTAL | Status: DC | PRN

## 2019-05-19 MED ORDER — FAMOTIDINE IN NACL 20-0.9 MG/50ML-% IV SOLN
20.0000 mg | Freq: Once | INTRAVENOUS | Status: AC
  Administered 2019-05-19: 20 mg via INTRAVENOUS
  Filled 2019-05-19: qty 50

## 2019-05-19 MED ORDER — GLIMEPIRIDE 2 MG PO TABS
2.0000 mg | ORAL_TABLET | Freq: Every day | ORAL | Status: DC
  Administered 2019-05-20 – 2019-05-23 (×4): 2 mg via ORAL
  Filled 2019-05-19 (×5): qty 1

## 2019-05-19 MED ORDER — SODIUM CHLORIDE 0.9 % IV SOLN
INTRAVENOUS | Status: DC
  Administered 2019-05-19: 23:00:00 via INTRAVENOUS

## 2019-05-19 MED ORDER — ENOXAPARIN SODIUM 40 MG/0.4ML ~~LOC~~ SOLN
40.0000 mg | SUBCUTANEOUS | Status: DC
  Administered 2019-05-19 – 2019-05-22 (×4): 40 mg via SUBCUTANEOUS
  Filled 2019-05-19 (×4): qty 0.4

## 2019-05-19 MED ORDER — MAGNESIUM HYDROXIDE 400 MG/5ML PO SUSP
30.0000 mL | Freq: Every day | ORAL | Status: DC | PRN
  Filled 2019-05-19: qty 30

## 2019-05-19 MED ORDER — ACETAMINOPHEN 325 MG PO TABS: 650.0000 mg | ORAL_TABLET | Freq: Four times a day (QID) | ORAL | Status: DC | PRN

## 2019-05-19 MED ORDER — MORPHINE SULFATE (PF) 4 MG/ML IV SOLN
4.0000 mg | Freq: Once | INTRAVENOUS | Status: AC
  Administered 2019-05-19: 4 mg via INTRAVENOUS
  Filled 2019-05-19: qty 1

## 2019-05-19 MED ORDER — MORPHINE SULFATE (PF) 2 MG/ML IV SOLN: 2.0000 mg | INTRAVENOUS | Status: DC | PRN

## 2019-05-19 MED ORDER — FERROUS GLUCONATE 324 (38 FE) MG PO TABS
324.0000 mg | ORAL_TABLET | Freq: Every day | ORAL | Status: DC
  Administered 2019-05-20 – 2019-05-24 (×5): 324 mg via ORAL
  Filled 2019-05-19 (×5): qty 1

## 2019-05-19 MED ORDER — FUROSEMIDE 40 MG PO TABS
80.0000 mg | ORAL_TABLET | Freq: Every day | ORAL | Status: DC
  Administered 2019-05-20 – 2019-05-21 (×2): 80 mg via ORAL
  Filled 2019-05-19: qty 2
  Filled 2019-05-19: qty 1

## 2019-05-19 MED ORDER — SODIUM CHLORIDE 0.9 % IV SOLN
2.0000 g | INTRAVENOUS | Status: DC
  Administered 2019-05-19 – 2019-05-21 (×3): 2 g via INTRAVENOUS
  Filled 2019-05-19: qty 20
  Filled 2019-05-19: qty 2
  Filled 2019-05-19 (×2): qty 20

## 2019-05-19 NOTE — ED Provider Notes (Signed)
[facility] Emergency Department Provider Note ____________________________________________   First MD Initiated Contact with Patient 05/19/19 1704     (approximate)  I have reviewed the triage vital signs and the nursing notes.   HISTORY  Chief Complaint Chest Pain    HPI Larry Callahan is a 63 y.o. male with PMH as noted below who presents with chest pain, acute onset today, described as a dull feeling across both sides of his chest, sometimes with burning going up towards his neck and throat.  He denies any associated shortness of breath.  In addition he feels like both of his legs are numb, and he has some tingling in his fingertips bilaterally.  He states that he had a fall today at home because of the leg numbness, and now has pain in the left hip and low back.  He denies any head injury.  He has no fever, vomiting, or diarrhea.  Past Medical History:  Diagnosis Date   Allergy    Coronary artery disease    Diabetes mellitus without complication (HCC)    Diastolic CHF ([place])    GERD (gastroesophageal reflux disease)    Hyperlipidemia    Hypertension    Renal insufficiency    Sleep apnea     Patient Active Problem List   Diagnosis Date Noted   CAP (community acquired pneumonia) 05/19/2019   AKI (acute kidney injury) ([place]) 05/09/2019   Hemoptysis 04/09/2019   Shortness of breath 03/26/2019   Uncontrolled type 2 diabetes mellitus with insulin therapy ([place]) 03/12/2019   Acute on chronic heart failure with preserved ejection fraction (HFpEF) ([place])    Acute kidney injury superimposed on chronic kidney disease ([place])    CHF (congestive heart failure) ([place]) 22/63/3354   Diastolic dysfunction 56/25/6389   Iron deficiency anemia 12/31/2018   Atopic dermatitis 11/24/2018   Screening for colon cancer 11/06/2017   Uncontrolled type 2 diabetes mellitus with hyperglycemia ([place]) 11/06/2017   Dermatitis, unspecified 06/09/2017     Atherosclerotic heart disease of native coronary artery without angina pectoris 06/09/2017   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   Obstructive sleep apnea, adult 06/09/2017   Morbid (severe) obesity due to excess calories ([place]) 06/09/2017   Cigarette nicotine dependence with nicotine-induced disorder 06/09/2017   Type 2 diabetes mellitus with diabetic polyneuropathy ([place]) 06/09/2017   Allergic rhinitis due to pollen 06/09/2017   Mixed hyperlipidemia 06/09/2017   Chronic obstructive pulmonary disease, unspecified ([place]) 06/09/2017   Dyspnea on exertion 06/09/2017   Wheezing 06/09/2017   Dysuria 06/09/2017   Snoring 06/09/2017   Essential hypertension 06/09/2017   Personal history of other malignant neoplasm of kidney 06/09/2017   Pain in right hip 06/09/2017   Impacted cerumen, bilateral 06/09/2017   Secondary hyperparathyroidism, not elsewhere classified ([place]) 06/09/2017   Type 2 diabetes mellitus with hyperglycemia ([place]) 06/09/2017   Tinea corporis 06/09/2017    Past Surgical History:  Procedure Laterality Date   BACK SURGERY     CHOLECYSTECTOMY     COLONOSCOPY WITH PROPOFOL N/A 04/12/2019   Procedure: COLONOSCOPY WITH PROPOFOL;  Surgeon: [name], MD;  Location: [facility] ENDOSCOPY;  Service: Gastroenterology;  Laterality: N/A;   ESOPHAGOGASTRODUODENOSCOPY N/A 04/12/2019   Procedure: ESOPHAGOGASTRODUODENOSCOPY (EGD);  Surgeon: [name], MD;  Location: [facility] ENDOSCOPY;  Service: Gastroenterology;  Laterality: N/A;   left arm surgery     nephrectomy Left    PARATHYROIDECTOMY     RIGHT HEART CATH N/A 03/29/2019   Procedure: RIGHT  HEART CATH;  Surgeon: [name], MD;  Location: [place] CV LAB;  Service: Cardiovascular;  Laterality: N/A;    Prior to Admission medications   Medication Sig Start Date End Date Taking? Authorizing Provider  amLODipine (NORVASC) 10 MG tablet Take 1 tablet (10  mg total) by mouth daily. 03/12/19  Yes [name], MD  ferrous gluconate (FERGON) 324 MG tablet Take 1 tablet (324 mg total) by mouth daily with breakfast. 12/31/18  Yes Boscia, Heather E, NP  Fluticasone-Salmeterol (ADVAIR DISKUS) 250-50 MCG/DOSE AEPB Inhale 1 puff into the lungs 2 (two) times daily. 03/05/19 03/04/20 Yes Gouru, Illene Silver, MD  furosemide (LASIX) 80 MG tablet Take 1 tablet (80 mg total) by mouth daily. 04/13/19  Yes Hillary Bow, MD  gemfibrozil (LOPID) 600 MG tablet Take 1 tablet (600 mg total) by mouth 2 (two) times daily with a meal. 02/28/19  Yes Boscia, Heather E, NP  glimepiride (AMARYL) 2 MG tablet Take 1 tablet (2 mg total) by mouth daily before breakfast. 03/13/19  Yes Boscia, Heather E, NP  hydrALAZINE (APRESOLINE) 100 MG tablet Take 1 tablet (100 mg total) by mouth 3 (three) times daily. 03/31/19  Yes Gouru, Aruna, MD  Insulin Glargine, 1 Unit Dial, (TOUJEO SOLOSTAR) 300 UNIT/ML SOPN Inject 26 Units into the skin every evening. 03/13/19 06/11/19 Yes Boscia, Heather E, NP  ipratropium-albuterol (DUONEB) 0.5-2.5 (3) MG/3ML SOLN Take 3 mLs by nebulization every 6 (six) hours as needed. 03/08/19  Yes Scarboro, Audie Clear, NP  isosorbide mononitrate (IMDUR) 30 MG 24 hr tablet Take 1 tablet (30 mg total) by mouth 2 (two) times daily. 04/13/19  Yes Sudini, Alveta Heimlich, MD  loratadine (CLARITIN) 10 MG tablet Take 1 tablet (10 mg total) by mouth daily. 04/01/19  Yes Gouru, Illene Silver, MD  metoprolol succinate (TOPROL-XL) 50 MG 24 hr tablet Take 1 tablet (50 mg total) by mouth at bedtime. Take with or immediately following a meal. 03/12/19  Yes Gollan, Kathlene November, MD  omeprazole (PRILOSEC) 40 MG capsule Take 1 capsule (40 mg total) by mouth daily. 03/01/19  Yes Scarboro, Audie Clear, NP  acetaminophen (TYLENOL) 325 MG tablet Take 2 tablets (650 mg total) by mouth every 4 (four) hours as needed for headache or mild pain. 03/05/19   Gouru, Illene Silver, MD  albuterol (VENTOLIN HFA) 108 (90 Base) MCG/ACT inhaler Inhale  2 puffs into the lungs every 6 (six) hours as needed for wheezing or shortness of breath. 03/05/19   Nicholes Mango, MD  Blood Glucose Calibration (TRUE METRIX LEVEL 1) Low SOLN Use as directed 04/04/18   [name], NP  Blood Glucose Monitoring Suppl (TRUE METRIX AIR GLUCOSE METER) DEVI 1 Device by Does not apply route 2 (two) times daily. 04/13/18   [name], NP  clarithromycin (BIAXIN) 500 MG tablet Take 1 tablet (500 mg total) by mouth every 12 (twelve) hours for 7 days. 05/13/19 05/20/19  Marcell Anger, MD  clobetasol cream (TEMOVATE) 5.05 % Apply 1 application topically 2 (two) times daily. Patient not taking: Reported on 05/19/2019 12/31/18   [name], NP  ferrous sulfate 325 (65 FE) MG EC tablet Take 1 tablet (325 mg total) by mouth 2 (two) times daily with a meal. 04/13/19 05/13/19  Hillary Bow, MD  glucose blood (TRUE METRIX BLOOD GLUCOSE TEST) test strip Use as instructed twice daily diag E11.65 05/15/19   [name], NP  nicotine (NICODERM CQ - DOSED IN MG/24 HOURS) 21 mg/24hr patch Place 1 patch (21 mg  total) onto the skin daily. Patient not taking: Reported on 05/16/2019 12/31/18   [name], NP  NOVOFINE 32G X 6 MM MISC To use with [place] injections daily 12/13/18   [name], NP  predniSONE (DELTASONE) 20 MG tablet Take 1 tablet (20 mg total) by mouth daily. Patient not taking: Reported on 05/18/2019 04/29/19   Caren Griffins, MD  tiotropium (SPIRIVA HANDIHALER) 18 MCG inhalation capsule Place 1 capsule (18 mcg total) into inhaler and inhale daily. 04/13/19 05/16/19  Hillary Bow, MD  triamcinolone ointment (KENALOG) 0.1 % Apply 1 application topically 2 (two) times daily. Patient not taking: Reported on 05/19/2019 11/23/18   [name], NP  TRUEPLUS LANCETS 33G MISC Use as directed twice daily diag E11.65 04/04/18   [name], NP    Allergies Penicillins  Family History  Problem Relation Age of Onset   Diabetes  Mother    Lung cancer Father    Diabetes Sister    Hypertension Sister    Heart disease Sister    Cancer Sister    Bone cancer Brother     Social History Social History   Tobacco Use   Smoking status: Former Smoker   Smokeless tobacco: Former Systems developer    Types: Chew  Substance Use Topics   Alcohol use: No    Frequency: Never   Drug use: No    Review of Systems  Constitutional: No fever. Eyes: No redness. ENT: No sore throat. Cardiovascular: Positive for chest pain. Respiratory: Denies shortness of breath. Gastrointestinal: No vomiting or diarrhea.  Genitourinary: Negative for dysuria.  Musculoskeletal: Negative for back pain. Skin: Negative for rash. Neurological: Negative for headache.   ____________________________________________   PHYSICAL EXAM:  VITAL SIGNS: ED Triage Vitals  Enc Vitals Group     BP 05/19/19 1518 (!) 161/76     Pulse Rate 05/19/19 1518 80     Resp 05/19/19 1518 20     Temp 05/19/19 1518 98.2 F (36.8 C)     Temp Source 05/19/19 1518 Oral     SpO2 05/19/19 1518 97 %     Weight 05/19/19 1519 267 lb 6.7 oz (121.3 kg)     Height 05/19/19 1519 5\' 11"  (1.803 m)     Head Circumference --      Peak Flow --      Pain Score 05/19/19 1519 6     Pain Loc --      Pain Edu? --      Excl. in [place]? --     Constitutional: Alert and oriented.  Uncomfortable appearing but in no acute distress. Eyes: Conjunctivae are normal.  No scleral icterus. Head: Atraumatic. Nose: No congestion/rhinnorhea. Mouth/Throat: Mucous membranes are moist.   Neck: Normal range of motion.  Cardiovascular: Normal rate, regular rhythm. Good peripheral circulation. Respiratory: Normal respiratory effort.  No retractions. Gastrointestinal: Soft and nontender. No distention.  Genitourinary: No flank tenderness. Musculoskeletal: No lower extremity edema.  Extremities warm and well perfused.  Neurologic:  Normal speech and language. No gross focal neurologic deficits  are appreciated.  Skin:  Skin is warm and dry. No rash noted. Psychiatric: Mood and affect are normal. Speech and behavior are normal.  ____________________________________________   LABS (all labs ordered are listed, but only abnormal results are displayed)  Labs Reviewed  BASIC METABOLIC PANEL - Abnormal; Notable for the following components:      Result Value   Potassium 3.0 (*)    Glucose, Bld 204 (*)  BUN 55 (*)    Creatinine, Ser 2.56 (*)    GFR calc non Af Amer 25 (*)    GFR calc Af Amer 29 (*)    All other components within normal limits  CBC - Abnormal; Notable for the following components:   RBC 4.01 (*)    Hemoglobin 11.1 (*)    HCT 35.2 (*)    RDW 16.2 (*)    All other components within normal limits  BRAIN NATRIURETIC PEPTIDE - Abnormal; Notable for the following components:   B Natriuretic Peptide 143.0 (*)    All other components within normal limits  TROPONIN I (HIGH SENSITIVITY) - Abnormal; Notable for the following components:   Troponin I (High Sensitivity) 18 (*)    All other components within normal limits  TROPONIN I (HIGH SENSITIVITY) - Abnormal; Notable for the following components:   Troponin I (High Sensitivity) 19 (*)    All other components within normal limits  SARS CORONAVIRUS 2 (TAT 6-24 HRS)  EXPECTORATED SPUTUM ASSESSMENT W REFEX TO RESP CULTURE  BASIC METABOLIC PANEL  CBC  POC SARS CORONAVIRUS 2 AG -  ED  POC SARS CORONAVIRUS 2 AG   ____________________________________________  EKG  ED ECG REPORT I, Arta Silence, the attending physician, personally viewed and interpreted this ECG.  Date: 05/19/2019 EKG Time: 1519 Rate: 75 Rhythm: normal sinus rhythm QRS Axis: normal Intervals: normal ST/T Wave abnormalities: Nonspecific lateral ST abnormality Narrative Interpretation: Nonspecific abnormalities with no significant change when compared to EKG of 05/15/2026 no evidence of acute  ischemia  ____________________________________________  RADIOLOGY  CXR: Bibasilar infiltrates XR L hip: No acute fracture XR lumbar spine: No acute  ____________________________________________   PROCEDURES  Procedure(s) performed: No  Procedures  Critical Care performed: No ____________________________________________   INITIAL IMPRESSION / ASSESSMENT AND PLAN / ED COURSE  Pertinent labs & imaging results that were available during my care of the patient were reviewed by me and considered in my medical decision making (see chart for details).  63 year old male with PMH as noted above presents with somewhat atypical chest pain acute onset today.  He also reports bilateral lower extremity numbness and a fall causing pain to his left hip and low back.  I reviewed the past medical records in Epic; the patient was just discharged from the hospital yesterday.  He has had 2 admissions since late last month initially due to hemoptysis and states that he was not having chest pain at that time (although the ED note from 11/26 mentions that he complained of chest pain).  He had an echocardiogram on 11/14 showing slightly decreased LVEF.  He had a VQ scan last month which was normal.  Bronchoscopy was negative for any source of bleeding.  On exam today, the patient is relatively well-appearing.  His vital signs are normal except for hypertension.  The abdomen is soft nontender.  He has normal motor strength to bilateral lower extremities although reports diffuse numbness.  He does have some pain on range of motion of the left hip.  In terms of the chest pain, some aspects of it are suggestive of GERD.  Differential includes musculoskeletal pain, or less likely ACS.  There is no clinical evidence for PE, and it would be especially unlikely given the negative VQ scan a few weeks ago.  Differential also includes ACS.  The numbness is diffuse and bilateral, and most consistent with neuropathy  related to his diabetes.  There is no evidence of spinal etiology.  Given the  fall and acute pain we will obtain x-rays of the lumbar spine and left hip.  ----------------------------------------- 9:38 PM on 05/19/2019 -----------------------------------------  Chest x-ray shows possible bibasilar infiltrates, which do appear new when compared to recent imaging.  The patient does have some shortness of breath but no significant cough or fever to suggest pneumonia.  Differential includes COVID-19, but a rapid antigen test was negative.  Lab work-up reveals AKI.  Given the patient's persistent chest pain and the elevated creatinine, I will admit him.  I discussed his case with Dr. Sidney Ace from the hospitalist service.  __________________________  Larry Callahan was evaluated in Emergency Department on 05/19/2019 for the symptoms described in the history of present illness. He was evaluated in the context of the global COVID-19 pandemic, which necessitated consideration that the patient might be at risk for infection with the SARS-CoV-2 virus that causes COVID-19. Institutional protocols and algorithms that pertain to the evaluation of patients at risk for COVID-19 are in a state of rapid change based on information released by regulatory bodies including the CDC and federal and state organizations. These policies and algorithms were followed during the patient's care in the ED.  ____________________________________________   FINAL CLINICAL IMPRESSION(S) / ED DIAGNOSES  Final diagnoses:  Chest pain, unspecified type  Acute kidney injury ([place])      NEW MEDICATIONS STARTED DURING THIS VISIT:  New Prescriptions   No medications on file     Note:  This document was prepared using Dragon voice recognition software and may include unintentional dictation errors.    Arta Silence, MD 05/19/19 2139

## 2019-05-19 NOTE — ED Notes (Signed)
Patient transported to X-ray 

## 2019-05-19 NOTE — ED Notes (Signed)
Sister called at this time. Update given. Pt speaking to sister on mobile.

## 2019-05-19 NOTE — ED Notes (Signed)
Pt reports chest pain getting worse. Nicole, NT to hook pt up to 5 lead.

## 2019-05-19 NOTE — H&P (Addendum)
[facility] [place] NAME: Larry Callahan    MR#:  [id number]  DATE OF BIRTH:  28-Sep-1955  DATE OF ADMISSION:  05/19/2019  PRIMARY CARE PHYSICIAN: [name], NP   REQUESTING/REFERRING PHYSICIAN: Arta Silence, MD  CHIEF COMPLAINT:   Chief Complaint  Patient presents with   Chest Pain    HISTORY OF PRESENT ILLNESS:  Larry Callahan  is a 63 y.o. obese Caucasian male with a known history of coronary artery disease, type 2 diabetes mellitus, hypertension and dyslipidemia, who presented to the emergency room with acute onset of pain across his chest described as pressure with radiation to the right forearm, increasing with exertion and associated cough productive of yellowish sputum which has been going on over the last week with associated chills and mild dyspnea with no hemoptysis.  He denies any palpitations or headache or dizziness.  No nausea vomiting or diarrhea.  He has been having low back pain.  He admitted to chills but did not have any reported fever.  Uses CPAP for his sleep apnea.  He has been having bilateral leg numbness and mild edema without pain.  No dysuria, oliguria or hematuria or flank pain.  He was recently admitted here for hemoptysis and underwent a bronchoscopy by Dr. Humphrey Rolls on 05/17/2019 that was positive essentially normal.  Upon presentation to the emergency room, blood pressure was 161/76 with a temperature of 98.2, pulse of 80 and respiratory to 20 and pulse oximetry of 97% on room air.  Labs revealed hyponatremia of 3 and sodium of 139 with a BUN of 55 and creatinine 2.56 compared to 34 and 1.73 yesterday.  BNP was 143 and high-sensitivity troponin I was 18 and later 19.  CBC was unremarkable except for mild anemia that is better than yesterday.  His SARS antigen came back negative.  His SARS PCR was negative on 12/3.  Lumbar spine x-ray showed no acute abnormalities but showed advanced multilevel degenerative changes throughout  the thoracolumbar spine.  Portable chest x-ray showed patchy bibasal infiltrates.  The patient was given 20 mg of IV Pepcid, 4 mg of IV morphine sulfate and 500 mL IV normal saline bolus.  He will be admitted to a medically monitored bed for further evaluation and management. PAST MEDICAL HISTORY:   Past Medical History:  Diagnosis Date   Allergy    Coronary artery disease    Diabetes mellitus without complication (HCC)    Diastolic CHF ([place])    GERD (gastroesophageal reflux disease)    Hyperlipidemia    Hypertension    Renal insufficiency    Sleep apnea     PAST SURGICAL HISTORY:   Past Surgical History:  Procedure Laterality Date   BACK SURGERY     CHOLECYSTECTOMY     COLONOSCOPY WITH PROPOFOL N/A 04/12/2019   Procedure: COLONOSCOPY WITH PROPOFOL;  Surgeon: [name], MD;  Location: [name] ENDOSCOPY;  Service: Gastroenterology;  Laterality: N/A;   ESOPHAGOGASTRODUODENOSCOPY N/A 04/12/2019   Procedure: ESOPHAGOGASTRODUODENOSCOPY (EGD);  Surgeon: [name], MD;  Location: [facility] ENDOSCOPY;  Service: Gastroenterology;  Laterality: N/A;   left arm surgery     nephrectomy Left    PARATHYROIDECTOMY     RIGHT HEART CATH N/A 03/29/2019   Procedure: RIGHT HEART CATH;  Surgeon: [name], MD;  Location: [facility];  Service: Cardiovascular;  Laterality: N/A;    SOCIAL HISTORY:   Social History   Tobacco Use   Smoking status: Former Smoker  Smokeless tobacco: Former Systems developer    Types: Chew  Substance Use Topics   Alcohol use: No    Frequency: Never    FAMILY HISTORY:   Family History  Problem Relation Age of Onset   Diabetes Mother    Lung cancer Father    Diabetes Sister    Hypertension Sister    Heart disease Sister    Cancer Sister    Bone cancer Brother     DRUG ALLERGIES:   Allergies  Allergen Reactions   Penicillins Rash    REVIEW OF SYSTEMS:   ROS As per history of present illness. All  pertinent systems were reviewed above. Constitutional,  HEENT, cardiovascular, respiratory, GI, GU, musculoskeletal, neuro, psychiatric, endocrine,  integumentary and hematologic systems were reviewed and are otherwise  negative/unremarkable except for positive findings mentioned above in the HPI.   MEDICATIONS AT HOME:   Prior to Admission medications   Medication Sig Start Date End Date Taking? Authorizing Provider  amLODipine (NORVASC) 10 MG tablet Take 1 tablet (10 mg total) by mouth daily. 03/12/19  Yes [name], MD  ferrous gluconate (FERGON) 324 MG tablet Take 1 tablet (324 mg total) by mouth daily with breakfast. 12/31/18  Yes Boscia, Heather E, NP  Fluticasone-Salmeterol (ADVAIR DISKUS) 250-50 MCG/DOSE AEPB Inhale 1 puff into the lungs 2 (two) times daily. 03/05/19 03/04/20 Yes Gouru, Illene Silver, MD  furosemide (LASIX) 80 MG tablet Take 1 tablet (80 mg total) by mouth daily. 04/13/19  Yes Hillary Bow, MD  gemfibrozil (LOPID) 600 MG tablet Take 1 tablet (600 mg total) by mouth 2 (two) times daily with a meal. 02/28/19  Yes Boscia, Heather E, NP  glimepiride (AMARYL) 2 MG tablet Take 1 tablet (2 mg total) by mouth daily before breakfast. 03/13/19  Yes Boscia, Heather E, NP  hydrALAZINE (APRESOLINE) 100 MG tablet Take 1 tablet (100 mg total) by mouth 3 (three) times daily. 03/31/19  Yes Gouru, Aruna, MD  Insulin Glargine, 1 Unit Dial, (TOUJEO SOLOSTAR) 300 UNIT/ML SOPN Inject 26 Units into the skin every evening. 03/13/19 06/11/19 Yes Boscia, Heather E, NP  ipratropium-albuterol (DUONEB) 0.5-2.5 (3) MG/3ML SOLN Take 3 mLs by nebulization every 6 (six) hours as needed. 03/08/19  Yes Scarboro, Audie Clear, NP  isosorbide mononitrate (IMDUR) 30 MG 24 hr tablet Take 1 tablet (30 mg total) by mouth 2 (two) times daily. 04/13/19  Yes Sudini, Alveta Heimlich, MD  loratadine (CLARITIN) 10 MG tablet Take 1 tablet (10 mg total) by mouth daily. 04/01/19  Yes Gouru, Illene Silver, MD  metoprolol succinate (TOPROL-XL) 50 MG  24 hr tablet Take 1 tablet (50 mg total) by mouth at bedtime. Take with or immediately following a meal. 03/12/19  Yes Gollan, Kathlene November, MD  omeprazole (PRILOSEC) 40 MG capsule Take 1 capsule (40 mg total) by mouth daily. 03/01/19  Yes Scarboro, Audie Clear, NP  acetaminophen (TYLENOL) 325 MG tablet Take 2 tablets (650 mg total) by mouth every 4 (four) hours as needed for headache or mild pain. 03/05/19   Gouru, Illene Silver, MD  albuterol (VENTOLIN HFA) 108 (90 Base) MCG/ACT inhaler Inhale 2 puffs into the lungs every 6 (six) hours as needed for wheezing or shortness of breath. 03/05/19   Nicholes Mango, MD  Blood Glucose Calibration (TRUE METRIX LEVEL 1) Low SOLN Use as directed 04/04/18   [name], NP  Blood Glucose Monitoring Suppl (TRUE METRIX AIR GLUCOSE METER) DEVI 1 Device by Does not apply route 2 (two) times daily. 04/13/18   Leretha Pol  E, NP  clarithromycin (BIAXIN) 500 MG tablet Take 1 tablet (500 mg total) by mouth every 12 (twelve) hours for 7 days. 05/13/19 05/20/19  Marcell Anger, MD  clobetasol cream (TEMOVATE) 2.99 % Apply 1 application topically 2 (two) times daily. Patient not taking: Reported on 05/19/2019 12/31/18   [name], NP  ferrous sulfate 325 (65 FE) MG EC tablet Take 1 tablet (325 mg total) by mouth 2 (two) times daily with a meal. 04/13/19 05/13/19  Hillary Bow, MD  glucose blood (TRUE METRIX BLOOD GLUCOSE TEST) test strip Use as instructed twice daily diag E11.65 05/15/19   [name], NP  nicotine (NICODERM CQ - DOSED IN MG/24 HOURS) 21 mg/24hr patch Place 1 patch (21 mg total) onto the skin daily. Patient not taking: Reported on 05/16/2019 12/31/18   [name], NP  NOVOFINE 32G X 6 MM MISC To use with [place] injections daily 12/13/18   [name], NP  predniSONE (DELTASONE) 20 MG tablet Take 1 tablet (20 mg total) by mouth daily. Patient not taking: Reported on 05/18/2019 04/29/19   Caren Griffins, MD  tiotropium (SPIRIVA HANDIHALER) 18  MCG inhalation capsule Place 1 capsule (18 mcg total) into inhaler and inhale daily. 04/13/19 05/16/19  Hillary Bow, MD  triamcinolone ointment (KENALOG) 0.1 % Apply 1 application topically 2 (two) times daily. Patient not taking: Reported on 05/19/2019 11/23/18   [name], NP  TRUEPLUS LANCETS 33G MISC Use as directed twice daily diag E11.65 04/04/18   [name], NP      VITAL SIGNS:  Blood pressure (!) 167/96, pulse 80, temperature 98.2 F (36.8 C), temperature source Oral, resp. rate (!) 22, height 5\' 11"  (1.803 m), weight 121.3 kg, SpO2 96 %.  PHYSICAL EXAMINATION:  Physical Exam  GENERAL:  63 y.o.-year-old obese Caucasian male patient lying in the bed with no acute distress.  EYES: Pupils equal, round, reactive to light and accommodation. No scleral icterus. Extraocular muscles intact.  HEENT: Head atraumatic, normocephalic. Oropharynx and nasopharynx clear.  NECK:  Supple, no jugular venous distention. No thyroid enlargement, no tenderness.  LUNGS: Mildly diminished bibasal breath sounds with minimal bibasal crackles and audible expiratory wheezes with minimally diminished expiratory airflow.  CARDIOVASCULAR: Regular rate and rhythm, S1, S2 normal. No murmurs, rubs, or gallops.  ABDOMEN: Soft, nondistended, nontender. Bowel sounds present. No organomegaly or mass.  EXTREMITIES: No pedal edema, cyanosis, or clubbing.  NEUROLOGIC: Cranial nerves II through XII are intact. Muscle strength 5/5 in all extremities. Sensation intact. Gait not checked.  PSYCHIATRIC: The patient is alert and oriented x 3.  Normal affect and good eye contact. SKIN: No obvious rash, lesion, or ulcer.   LABORATORY PANEL:   CBC Recent Labs  Lab 05/19/19 1522  WBC 8.0  HGB 11.1*  HCT 35.2*  PLT 210   ------------------------------------------------------------------------------------------------------------------  Chemistries  Recent Labs  Lab 05/17/19 0830  05/19/19 1522  NA  --     < > 139  K  --    < > 3.0*  CL  --    < > 106  CO2  --    < > 22  GLUCOSE  --    < > 204*  BUN  --    < > 55*  CREATININE  --    < > 2.56*  CALCIUM  --    < > 8.9  MG 2.0  --   --    < > = values in this  interval not displayed.   ------------------------------------------------------------------------------------------------------------------  Cardiac Enzymes No results for input(s): TROPONINI in the last 168 hours. ------------------------------------------------------------------------------------------------------------------  RADIOLOGY:  Dg Chest 2 View  Result Date: 05/19/2019 CLINICAL DATA:  Chest pain. EXAM: CHEST - 2 VIEW COMPARISON:  Chest x-ray 05/16/2019 and chest CT 05/09/2019 FINDINGS: The cardiac silhouette, mediastinal and hilar contours are stable. Stable tortuosity of the thoracic aorta. There are new patchy bibasilar infiltrates. No pleural effusions or pulmonary lesions. The bony thorax is intact. IMPRESSION: Patchy bibasilar infiltrates. Electronically Signed   By: Marijo Sanes M.D.   On: 05/19/2019 16:41   Dg Lumbar Spine 2-3 Views  Result Date: 05/19/2019 CLINICAL DATA:  Low back pain status post recent fall. EXAM: LUMBAR SPINE - 2-3 VIEW COMPARISON:  06/26/2010 FINDINGS: There is no acute osseous abnormality. Multilevel advanced degenerative changes are noted throughout the visualized portions of the thoracolumbar spine. Facet arthrosis is noted at the lower lumbar segments. Vascular calcifications are noted. Multiple surgical clips project over the patient's abdomen. IMPRESSION: 1. No acute osseous abnormality. 2. Advanced multilevel degenerative changes are noted throughout the thoracolumbar spine. Electronically Signed   By: Constance Holster M.D.   On: 05/19/2019 19:12   Dg Hip Unilat With Pelvis 2-3 Views Right  Result Date: 05/19/2019 CLINICAL DATA:  Low back and hip pain after falling EXAM: DG HIP (WITH OR WITHOUT PELVIS) 2-3V RIGHT COMPARISON:  04/29/2019  FINDINGS: Osseous demineralization. Degenerative changes of the hip joints bilaterally with joint space narrowing and spur formation greater on RIGHT. SI joints preserved. No definite fracture, dislocation or bone destruction. A lucency traversing the subcapital region of the RIGHT femoral neck on the frog-leg lateral view is unchanged since the previous exam and suspect is related to superimposition of the acetabulum. IMPRESSION: No acute osseous abnormalities. Osseous demineralization with degenerative changes of the hip joints bilaterally greater on RIGHT. Electronically Signed   By: Lavonia Dana M.D.   On: 05/19/2019 19:18      IMPRESSION AND PLAN:   1.  Community-acquired pneumonia.  He has subsequent mild COPD acute exacerbation.  The patient will be admitted to a medically monitored bed for community-acquired pneumonia and will be placed on IV Rocephin and Zithromax.  Mucolytic therapy be provided as well as duo nebs q.i.d. and q.4 hours p.r.n.Marland Kitchen  Sputum Gram stain culture and sensitivity will be obtained.  We will continue Spiriva and place the patient on scheduled and as needed duo nebs.  We will continue his steroid therapy with IV Solu-Medrol.  Mucolytic's will be provided.  2.  Chest pain, rule out acute coronary syndrome.  While his pain could be pleuritic and possibly of GI or musculoskeletal etiology, will still follow serial cardiac enzymes given his significant risk factors and history of coronary artery disease.  A cardiology consultation will be obtained by [facility].  I notified Dr. Garen Lah about the patient.  The patient will be placed on as needed sublingual nitroglycerin and morphine sulfate for pain.  We will continue Toprol-XL and Imdur.  3.  Acute kidney injury superimposed on stage IIIb chronic kidney disease.  The patient will be hydrated with IV normal saline and will follow his BMP.  4.  Hypokalemia.  Potassium will be placed in magnesium level will be checked.  5.  Type 2  diabetes mellitus.  We will continue to new basal coverage and place the patient on supplemental coverage with NovoLog.  6.  Hypertension.  We will continue his antihypertensives.  7.  DVT prophylaxis.  Subtenons Lovenox.  All the records are reviewed and case discussed with ED provider. The plan of care was discussed in details with the patient (and family). I answered all questions. The patient agreed to proceed with the above mentioned plan. Further management will depend upon hospital course.   CODE STATUS: I discussed the CODE STATUS with the patient and he desires to be full code.  TOTAL TIME TAKING CARE OF THIS PATIENT: 60 minutes.    Christel Mormon M.D on 05/19/2019 at 10:23 PM  [facility]   From 7 PM-7 AM, contact night-coverage [email or web address]  CC: Primary care physician; [name], NP   Note: This dictation was prepared with Dragon dictation along with smaller phrase technology. Any transcriptional errors that result from this process are unintentional.

## 2019-05-19 NOTE — ED Triage Notes (Signed)
Arrived by EMS from home. Patient c/o cp that "feels numb." Also reports numbness to bilateral legs. Reports he was just discharged from [facility] yesterday.

## 2019-05-19 NOTE — ED Notes (Signed)
First Nurse Note: Pt to ED via [facility] from home for Chest pain. Pt was D/C from hospital yesterday. Per EMS VSS. Pt is in NAD.

## 2019-05-20 ENCOUNTER — Encounter: Payer: Self-pay | Admitting: Internal Medicine

## 2019-05-20 DIAGNOSIS — R0602 Shortness of breath: Secondary | ICD-10-CM

## 2019-05-20 DIAGNOSIS — R079 Chest pain, unspecified: Secondary | ICD-10-CM

## 2019-05-20 DIAGNOSIS — Z789 Other specified health status: Secondary | ICD-10-CM

## 2019-05-20 DIAGNOSIS — R0781 Pleurodynia: Secondary | ICD-10-CM

## 2019-05-20 DIAGNOSIS — D509 Iron deficiency anemia, unspecified: Secondary | ICD-10-CM

## 2019-05-20 DIAGNOSIS — I10 Essential (primary) hypertension: Secondary | ICD-10-CM

## 2019-05-20 DIAGNOSIS — I5032 Chronic diastolic (congestive) heart failure: Secondary | ICD-10-CM

## 2019-05-20 DIAGNOSIS — I272 Pulmonary hypertension, unspecified: Secondary | ICD-10-CM

## 2019-05-20 LAB — BASIC METABOLIC PANEL
Anion gap: 9 (ref 5–15)
BUN: 50 mg/dL — ABNORMAL HIGH (ref 8–23)
CO2: 23 mmol/L (ref 22–32)
Calcium: 8.6 mg/dL — ABNORMAL LOW (ref 8.9–10.3)
Chloride: 107 mmol/L (ref 98–111)
Creatinine, Ser: 1.82 mg/dL — ABNORMAL HIGH (ref 0.61–1.24)
GFR calc Af Amer: 44 mL/min — ABNORMAL LOW (ref >60)
GFR calc non Af Amer: 38 mL/min — ABNORMAL LOW (ref >60)
Glucose, Bld: 98 mg/dL (ref 70–99)
Potassium: 2.9 mmol/L — ABNORMAL LOW (ref 3.5–5.1)
Sodium: 139 mmol/L (ref 135–145)

## 2019-05-20 LAB — CBC
HCT: 31.1 % — ABNORMAL LOW (ref 39.0–52.0)
Hemoglobin: 10.2 g/dL — ABNORMAL LOW (ref 13.0–17.0)
MCH: 27.6 pg (ref 26.0–34.0)
MCHC: 32.8 g/dL (ref 30.0–36.0)
MCV: 84.1 fL (ref 80.0–100.0)
Platelets: 189 10*3/uL (ref 150–400)
RBC: 3.7 MIL/uL — ABNORMAL LOW (ref 4.22–5.81)
RDW: 16.1 % — ABNORMAL HIGH (ref 11.5–15.5)
WBC: 7.6 10*3/uL (ref 4.0–10.5)
nRBC: 0 % (ref 0.0–0.2)

## 2019-05-20 LAB — MAGNESIUM
Magnesium: 2 mg/dL (ref 1.7–2.4)
Magnesium: 2 mg/dL (ref 1.7–2.4)

## 2019-05-20 LAB — GLUCOSE, CAPILLARY
Glucose-Capillary: 140 mg/dL — ABNORMAL HIGH (ref 70–99)
Glucose-Capillary: 107 mg/dL — ABNORMAL HIGH (ref 70–99)
Glucose-Capillary: 165 mg/dL — ABNORMAL HIGH (ref 70–99)
Glucose-Capillary: 222 mg/dL — ABNORMAL HIGH (ref 70–99)
Glucose-Capillary: 348 mg/dL — ABNORMAL HIGH (ref 70–99)

## 2019-05-20 LAB — SARS CORONAVIRUS 2 (TAT 6-24 HRS): SARS Coronavirus 2: NEGATIVE

## 2019-05-20 LAB — TROPONIN I (HIGH SENSITIVITY): Troponin I (High Sensitivity): 16 ng/L (ref <18)

## 2019-05-20 LAB — HEMOGLOBIN A1C
Hgb A1c MFr Bld: 7 % — ABNORMAL HIGH (ref 4.8–5.6)
Mean Plasma Glucose: 154.2 mg/dL

## 2019-05-20 LAB — PATHOLOGIST SMEAR REVIEW: Path Review: 12072020

## 2019-05-20 LAB — POTASSIUM: Potassium: 3.4 mmol/L — ABNORMAL LOW (ref 3.5–5.1)

## 2019-05-20 MED ORDER — POTASSIUM CHLORIDE CRYS ER 20 MEQ PO TBCR
20.0000 meq | EXTENDED_RELEASE_TABLET | Freq: Two times a day (BID) | ORAL | Status: DC
  Administered 2019-05-20 – 2019-05-24 (×9): 20 meq via ORAL
  Filled 2019-05-20 (×10): qty 1

## 2019-05-20 MED ORDER — POTASSIUM CHLORIDE 20 MEQ PO PACK
40.0000 meq | PACK | Freq: Once | ORAL | Status: AC
  Administered 2019-05-20: 40 meq via ORAL
  Filled 2019-05-20: qty 2

## 2019-05-20 MED ORDER — METHYLPREDNISOLONE SODIUM SUCC 125 MG IJ SOLR
60.0000 mg | Freq: Three times a day (TID) | INTRAMUSCULAR | Status: DC
  Administered 2019-05-20 – 2019-05-22 (×8): 60 mg via INTRAVENOUS
  Filled 2019-05-20 (×8): qty 2

## 2019-05-20 MED ORDER — SODIUM CHLORIDE 0.9 % IV SOLN
INTRAVENOUS | Status: DC | PRN
  Administered 2019-05-20: 22:00:00 250 mL via INTRAVENOUS

## 2019-05-20 MED ORDER — ASPIRIN 81 MG PO CHEW
81.0000 mg | CHEWABLE_TABLET | Freq: Every day | ORAL | Status: DC
  Administered 2019-05-20 – 2019-05-24 (×5): 81 mg via ORAL
  Filled 2019-05-20 (×5): qty 1

## 2019-05-20 MED ORDER — INSULIN ASPART 100 UNIT/ML ~~LOC~~ SOLN: 10.0000 [IU] | Freq: Once | SUBCUTANEOUS | Status: DC

## 2019-05-20 MED ORDER — INSULIN ASPART 100 UNIT/ML ~~LOC~~ SOLN
0.0000 [IU] | Freq: Three times a day (TID) | SUBCUTANEOUS | Status: DC
  Administered 2019-05-20: 3 [IU] via SUBCUTANEOUS
  Administered 2019-05-20 – 2019-05-21 (×2): 5 [IU] via SUBCUTANEOUS
  Administered 2019-05-21: 11 [IU] via SUBCUTANEOUS
  Administered 2019-05-21: 5 [IU] via SUBCUTANEOUS
  Administered 2019-05-22 – 2019-05-23 (×4): 11 [IU] via SUBCUTANEOUS
  Administered 2019-05-23: 3 [IU] via SUBCUTANEOUS
  Administered 2019-05-23: 8 [IU] via SUBCUTANEOUS
  Administered 2019-05-24: 2 [IU] via SUBCUTANEOUS
  Filled 2019-05-20 (×12): qty 1

## 2019-05-20 MED ORDER — IPRATROPIUM-ALBUTEROL 0.5-2.5 (3) MG/3ML IN SOLN
3.0000 mL | Freq: Four times a day (QID) | RESPIRATORY_TRACT | Status: DC
  Administered 2019-05-20 – 2019-05-21 (×8): 3 mL via RESPIRATORY_TRACT
  Filled 2019-05-20 (×9): qty 3

## 2019-05-20 NOTE — Progress Notes (Addendum)
Brief course: 63 y.o. obese Caucasian male with a known history of coronary artery disease, type 2 diabetes mellitus, hypertension and dyslipidemia, who presented  with acute onset of pain across his chest described as pressure with radiation to the right forearm, increasing with exertion and associated cough productive of yellowish sputum which has been going on over the last week with associated chills and mild dyspnea with no hemoptysis. He was recently admitted here for hemoptysis and underwent a bronchoscopy by Dr. Humphrey Rolls on 05/17/2019 that was positive essentially normal. His SARS PCR was negative on 12/3. Portable chest x-ray showed patchy bibasal infiltrates.  Subjective: Says is feeling better this morning than when he first came in.  He feels he is having short of breath even though he was not needing any supplemental oxygen and his saturation is well above 92%.  Patient says he always has that feeling.  Objective: Vital signs in last 24 hours: Temp:  [98.2 F (36.8 C)-98.7 F (37.1 C)] 98.7 F (37.1 C) (12/07 0544) Pulse Rate:  [61-80] 68 (12/07 1237) Resp:  [16-22] 18 (12/07 1237) BP: (130-167)/(61-96) 130/68 (12/07 1237) SpO2:  [96 %-97 %] 96 % (12/07 1237) Weight:  [121.3 kg] 121.3 kg (12/06 1519)  Intake/Output from previous day: 12/06 0701 - 12/07 0700 In: 850  Out: -  Intake/Output this shift: No intake/output data recorded.  GENERAL: Lying in the bed with no acute distress.  EYES: Pupils equal, round, reactive to light and accommodation. No scleral icterus. Extraocular muscles intact.  HEENT: Head atraumatic, normocephalic. Oropharynx and nasopharynx clear.  NECK:  Supple, no jugular venous distention. No thyroid enlargement, no tenderness.  LUNGS: Mildly diminished bibasal breath sounds with minimal bibasal crackles and diminished expiratory airflow.   CARDIOVASCULAR: Regular rate and rhythm, S1, S2 normal. No murmurs, rubs, or gallops.  ABDOMEN: Soft, nondistended,  nontender. Bowel sounds present. No organomegaly or mass.  EXTREMITIES: No pedal edema, cyanosis, or clubbing.  NEUROLOGIC: Distally normal PSYCHIATRIC: The patient is alert and oriented x 3.  Normal affect and good eye contact. SKIN: No obvious rash, lesion, or ulcer.   Results for orders placed or performed during the hospital encounter of 05/19/19 (from the past 24 hour(s))  Basic metabolic panel     Status: Abnormal   Collection Time: 05/19/19  3:22 PM  Result Value Ref Range   Sodium 139 135 - 145 mmol/L   Potassium 3.0 (L) 3.5 - 5.1 mmol/L   Chloride 106 98 - 111 mmol/L   CO2 22 22 - 32 mmol/L   Glucose, Bld 204 (H) 70 - 99 mg/dL   BUN 55 (H) 8 - 23 mg/dL   Creatinine, Ser 2.56 (H) 0.61 - 1.24 mg/dL   Calcium 8.9 8.9 - 10.3 mg/dL   GFR calc non Af Amer 25 (L) >60 mL/min   GFR calc Af Amer 29 (L) >60 mL/min   Anion gap 11 5 - 15  CBC     Status: Abnormal   Collection Time: 05/19/19  3:22 PM  Result Value Ref Range   WBC 8.0 4.0 - 10.5 K/uL   RBC 4.01 (L) 4.22 - 5.81 MIL/uL   Hemoglobin 11.1 (L) 13.0 - 17.0 g/dL   HCT 35.2 (L) 39.0 - 52.0 %   MCV 87.8 80.0 - 100.0 fL   MCH 27.7 26.0 - 34.0 pg   MCHC 31.5 30.0 - 36.0 g/dL   RDW 16.2 (H) 11.5 - 15.5 %   Platelets 210 150 - 400 K/uL   nRBC  0.0 0.0 - 0.2 %  Troponin I (High Sensitivity)     Status: Abnormal   Collection Time: 05/19/19  3:22 PM  Result Value Ref Range   Troponin I (High Sensitivity) 18 (H) <18 ng/L  Brain natriuretic peptide     Status: Abnormal   Collection Time: 05/19/19  3:22 PM  Result Value Ref Range   B Natriuretic Peptide 143.0 (H) 0.0 - 100.0 pg/mL  Troponin I (High Sensitivity)     Status: Abnormal   Collection Time: 05/19/19  5:48 PM  Result Value Ref Range   Troponin I (High Sensitivity) 19 (H) <18 ng/L  SARS CORONAVIRUS 2 (TAT 6-24 HRS) Nasopharyngeal Nasopharyngeal Swab     Status: None   Collection Time: 05/19/19  8:21 PM   Specimen: Nasopharyngeal Swab  Result Value Ref Range   SARS  Coronavirus 2 NEGATIVE NEGATIVE  POC SARS Coronavirus 2 Ag     Status: None   Collection Time: 05/19/19  9:01 PM  Result Value Ref Range   SARS Coronavirus 2 Ag NEGATIVE NEGATIVE  Glucose, capillary     Status: Abnormal   Collection Time: 05/19/19 10:32 PM  Result Value Ref Range   Glucose-Capillary 140 (H) 70 - 99 mg/dL  Basic metabolic panel     Status: Abnormal   Collection Time: 05/20/19  3:11 AM  Result Value Ref Range   Sodium 139 135 - 145 mmol/L   Potassium 2.9 (L) 3.5 - 5.1 mmol/L   Chloride 107 98 - 111 mmol/L   CO2 23 22 - 32 mmol/L   Glucose, Bld 98 70 - 99 mg/dL   BUN 50 (H) 8 - 23 mg/dL   Creatinine, Ser 1.82 (H) 0.61 - 1.24 mg/dL   Calcium 8.6 (L) 8.9 - 10.3 mg/dL   GFR calc non Af Amer 38 (L) >60 mL/min   GFR calc Af Amer 44 (L) >60 mL/min   Anion gap 9 5 - 15  CBC     Status: Abnormal   Collection Time: 05/20/19  3:11 AM  Result Value Ref Range   WBC 7.6 4.0 - 10.5 K/uL   RBC 3.70 (L) 4.22 - 5.81 MIL/uL   Hemoglobin 10.2 (L) 13.0 - 17.0 g/dL   HCT 31.1 (L) 39.0 - 52.0 %   MCV 84.1 80.0 - 100.0 fL   MCH 27.6 26.0 - 34.0 pg   MCHC 32.8 30.0 - 36.0 g/dL   RDW 16.1 (H) 11.5 - 15.5 %   Platelets 189 150 - 400 K/uL   nRBC 0.0 0.0 - 0.2 %  Magnesium     Status: None   Collection Time: 05/20/19  3:11 AM  Result Value Ref Range   Magnesium 2.0 1.7 - 2.4 mg/dL  Hemoglobin A1c     Status: Abnormal   Collection Time: 05/20/19  3:11 AM  Result Value Ref Range   Hgb A1c MFr Bld 7.0 (H) 4.8 - 5.6 %   Mean Plasma Glucose 154.2 mg/dL  Glucose, capillary     Status: Abnormal   Collection Time: 05/20/19  8:33 AM  Result Value Ref Range   Glucose-Capillary 107 (H) 70 - 99 mg/dL  Troponin I (High Sensitivity)     Status: None   Collection Time: 05/20/19  9:29 AM  Result Value Ref Range   Troponin I (High Sensitivity) 16 <18 ng/L  Potassium     Status: Abnormal   Collection Time: 05/20/19  9:29 AM  Result Value Ref Range   Potassium 3.4 (L) 3.5 -  5.1 mmol/L   Magnesium     Status: None   Collection Time: 05/20/19  9:29 AM  Result Value Ref Range   Magnesium 2.0 1.7 - 2.4 mg/dL  Glucose, capillary     Status: Abnormal   Collection Time: 05/20/19 12:34 PM  Result Value Ref Range   Glucose-Capillary 165 (H) 70 - 99 mg/dL    Studies/Results: Dg Chest 2 View  Result Date: 05/19/2019 CLINICAL DATA:  Chest pain. EXAM: CHEST - 2 VIEW COMPARISON:  Chest x-ray 05/16/2019 and chest CT 05/09/2019 FINDINGS: The cardiac silhouette, mediastinal and hilar contours are stable. Stable tortuosity of the thoracic aorta. There are new patchy bibasilar infiltrates. No pleural effusions or pulmonary lesions. The bony thorax is intact. IMPRESSION: Patchy bibasilar infiltrates. Electronically Signed   By: Marijo Sanes M.D.   On: 05/19/2019 16:41   Dg Chest 2 View  Result Date: 05/16/2019 CLINICAL DATA:  Hemoptysis EXAM: CHEST - 2 VIEW COMPARISON:  May 09, 2019 FINDINGS: There is mild cardiomegaly. Aortic knob calcifications. Both lungs are clear. No acute osseous abnormality. IMPRESSION: No active cardiopulmonary disease. Electronically Signed   By: Prudencio Pair M.D.   On: 05/16/2019 00:35   Dg Chest 2 View  Result Date: 05/09/2019 CLINICAL DATA:  Spitting up blood with lower extremity edema and generalized chest and upper abdominal pain since this morning. EXAM: CHEST - 2 VIEW COMPARISON:  04/24/2019 FINDINGS: Lungs are adequately inflated and otherwise clear. Cardiomediastinal silhouette and remainder of the exam is unchanged. IMPRESSION: No active cardiopulmonary disease. Electronically Signed   By: Marin Olp M.D.   On: 05/09/2019 09:48   Dg Chest 2 View  Result Date: 04/24/2019 CLINICAL DATA:  complaints of shortness of breath, fatigue since this morning. Pt here for same recently, admission due to CHF. HTN, CHF, DM, former smoker EXAM: CHEST - 2 VIEW COMPARISON:  04/10/2019 FINDINGS: Mild central pulmonary vascular congestion and mild diffuse  interstitial prominent, increased since previous. Heart size upper limits normal. Aortic Atherosclerosis (ICD10-170.0). No effusion. Anterior vertebral endplate spurring at multiple levels in the lower thoracic spine. IMPRESSION: Worsening central pulmonary vascular congestion and interstitial edema. Electronically Signed   By: Lucrezia Europe M.D.   On: 04/24/2019 12:25   Dg Lumbar Spine 2-3 Views  Result Date: 05/19/2019 CLINICAL DATA:  Low back pain status post recent fall. EXAM: LUMBAR SPINE - 2-3 VIEW COMPARISON:  06/26/2010 FINDINGS: There is no acute osseous abnormality. Multilevel advanced degenerative changes are noted throughout the visualized portions of the thoracolumbar spine. Facet arthrosis is noted at the lower lumbar segments. Vascular calcifications are noted. Multiple surgical clips project over the patient's abdomen. IMPRESSION: 1. No acute osseous abnormality. 2. Advanced multilevel degenerative changes are noted throughout the thoracolumbar spine. Electronically Signed   By: Constance Holster M.D.   On: 05/19/2019 19:12   Ct Head Wo Contrast  Result Date: 04/29/2019 CLINICAL DATA:  Syncopal episode today with trauma to the right side of the head EXAM: CT HEAD WITHOUT CONTRAST TECHNIQUE: Contiguous axial images were obtained from the base of the skull through the vertex without intravenous contrast. COMPARISON:  03/26/2019 FINDINGS: Brain: The brain shows a normal appearance without evidence of malformation, atrophy, old or acute small or large vessel infarction, mass lesion, hemorrhage, hydrocephalus or extra-axial collection. Vascular: No hyperdense vessel. No evidence of atherosclerotic calcification. Skull: Normal.  No traumatic finding.  No focal bone lesion. Sinuses/Orbits: Sinuses are clear. Orbits appear normal. Mastoids are clear. Other: None significant IMPRESSION: Normal head CT  Electronically Signed   By: Nelson Chimes M.D.   On: 04/29/2019 16:59   Ct Chest Wo  Contrast  Result Date: 05/09/2019 CLINICAL DATA:  Nonproductive cough, hemoptysis, bilateral leg swelling EXAM: CT CHEST WITHOUT CONTRAST TECHNIQUE: Multidetector CT imaging of the chest was performed following the standard protocol without IV contrast. COMPARISON:  Chest radiograph dated 05/09/2019. CTA chest dated 02/20/2019. FINDINGS: Cardiovascular: The heart is normal in size. No pericardial effusion. No evidence of thoracic aortic aneurysm. Atherosclerotic calcifications of the aortic arch. Coronary atherosclerosis of the LAD. Mediastinum/Nodes: No suspicious mediastinal lymphadenopathy. Visualized thyroid is unremarkable. Lungs/Pleura: No suspicious pulmonary nodules. No focal consolidation. Mild centrilobular emphysematous changes in the bilateral upper lobes. No pleural effusion or pneumothorax. Upper Abdomen: Visualized upper abdomen is notable for bilateral renal cysts measuring up to 7.5 cm in the left upper kidney, incompletely visualized. Musculoskeletal: Degenerative changes of the visualized thoracolumbar spine. IMPRESSION: No evidence of acute cardiopulmonary disease. Aortic Atherosclerosis (ICD10-I70.0) and Emphysema (ICD10-J43.9). Electronically Signed   By: Julian Hy M.D.   On: 05/09/2019 11:50   Nm Pulmonary Perf And Vent  Result Date: 05/11/2019 CLINICAL DATA:  Shortness of breath and hemoptysis EXAM: NUCLEAR MEDICINE VENTILATION - PERFUSION LUNG SCAN VIEWS: Anterior, posterior, left lateral, right lateral, RPO, LPO, RAO, LAO-ventilation and perfusion RADIOPHARMACEUTICALS:  31.87 mCi of Tc-44m DTPA aerosol inhalation and 4.19 mCi Tc16m MAA IV COMPARISON:  Chest radiograph May 08, 2016 FINDINGS: Ventilation: Radiotracer uptake is homogeneous and symmetric bilaterally. No focal ventilation defects are evident. Perfusion: Radiotracer uptake is homogeneous and symmetric bilaterally. No perfusion defects evident. IMPRESSION: No appreciable ventilation or perfusion defects. Very  low probability of pulmonary embolus. Electronically Signed   By: Lowella Grip III M.D.   On: 05/11/2019 14:32   Ct Renal Stone Study  Result Date: 04/24/2019 CLINICAL DATA:  Left flank pain. Generalized abdominal pain. Bloody stools. EXAM: CT ABDOMEN AND PELVIS WITHOUT CONTRAST TECHNIQUE: Multidetector CT imaging of the abdomen and pelvis was performed following the standard protocol without IV contrast. COMPARISON:  01/31/2014 CT abdomen/pelvis. FINDINGS: Lower chest: No significant pulmonary nodules or acute consolidative airspace disease. Hepatobiliary: Normal liver size. No liver mass. Cholecystectomy. No biliary ductal dilatation. Pancreas: Normal, with no mass or duct dilation. Spleen: Normal size spleen. Granulomatous calcifications throughout the spleen. No splenic mass. Adrenals/Urinary Tract: Normal right adrenal. 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 Left nephrectomy with no mass or fluid collection in the left nephrectomy bed. No right hydronephrosis. No right renal stones. Multiple simple right renal cysts, largest 7.7 cm in the posterior lower right kidney. Normal caliber right ureter. No right ureteral stones. Normal bladder with no bladder stones. Stomach/Bowel: Normal non-distended stomach. Normal caliber small bowel with no small bowel wall thickening. Normal appendix. Marked left colonic diverticulosis, with no large bowel wall thickening or significant pericolonic fat stranding. Vascular/Lymphatic: Atherosclerotic nonaneurysmal abdominal aorta. No pathologically enlarged lymph nodes in the abdomen or pelvis. Reproductive: Mildly enlarged prostate. Other: No pneumoperitoneum, ascites or focal fluid collection. Moderate to large fat containing umbilical hernia, slightly increased from prior. Chronic diastasis of posterior left lateral abdominal wall. Musculoskeletal: No  aggressive appearing focal osseous lesions. Moderate thoracolumbar spondylosis. IMPRESSION: 1. No acute abnormality. No evidence of bowel obstruction or acute bowel inflammation. Marked sigmoid diverticulosis, with no evidence of acute diverticulitis. 2. No right hydronephrosis.  No urolithiasis. 3. 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 Given the large size and continued growth, surgical consultation may be considered. 4. Left nephrectomy. 5. Mild prostatomegaly. 6. Moderate to large fat containing umbilical hernia, slightly increased. 7.  Aortic Atherosclerosis (ICD10-I70.0). Electronically Signed   By: Ilona Sorrel M.D.   On: 04/24/2019 16:17   Dg Hip Unilat With Pelvis 2-3 Views Left  Result Date: 04/29/2019 CLINICAL DATA:  Fall EXAM: DG HIP (WITH OR WITHOUT PELVIS) 2-3V LEFT COMPARISON:  None. FINDINGS: Anatomic alignment is maintained. There is no acute fracture. Mild changes of osteoarthritis. IMPRESSION: No acute fracture. Electronically Signed   By: Macy Mis M.D.   On: 04/29/2019 12:06   Dg Hip Unilat With Pelvis 2-3 Views Right  Result Date: 05/19/2019 CLINICAL DATA:  Low back and hip pain after falling EXAM: DG HIP (WITH OR WITHOUT PELVIS) 2-3V RIGHT COMPARISON:  04/29/2019 FINDINGS: Osseous demineralization. Degenerative changes of the hip joints bilaterally with joint space narrowing and spur formation greater on RIGHT. SI joints preserved. No definite fracture, dislocation or bone destruction. A lucency traversing the subcapital region of the RIGHT femoral neck on the frog-leg lateral view is unchanged since the previous exam and suspect is related to superimposition of the acetabulum. IMPRESSION: No acute osseous abnormalities. Osseous demineralization with degenerative changes of the hip joints bilaterally greater on RIGHT. Electronically Signed   By: Lavonia Dana M.D.   On: 05/19/2019 19:18   Dg Hip  Unilat With Pelvis 2-3 Views Right  Result Date: 04/29/2019 CLINICAL DATA:  Fall EXAM: DG HIP (WITH OR WITHOUT PELVIS) 2-3V RIGHT COMPARISON:  2011 FINDINGS: Alignment is anatomic. There is no acute fracture. Changes of osteoarthritis are present similar to 2011. IMPRESSION: No acute fracture at the right hip. Electronically Signed   By: Macy Mis M.D.   On: 04/29/2019 12:03    Scheduled Meds: . amLODipine  10 mg Oral Daily  . enoxaparin (LOVENOX) injection  40 mg Subcutaneous Q24H  . ferrous gluconate  324 mg Oral Q breakfast  . furosemide  80 mg Oral Daily  . gemfibrozil  600 mg Oral BID WC  . glimepiride  2 mg Oral QAC breakfast  . guaiFENesin  600 mg Oral BID  . hydrALAZINE  100 mg Oral TID  . insulin aspart  0-15 Units Subcutaneous TID WC  . insulin glargine  15 Units Subcutaneous QHS  . ipratropium-albuterol  3 mL Nebulization QID  . isosorbide mononitrate  30 mg Oral BID  . loratadine  10 mg Oral Daily  . methylPREDNISolone (SOLU-MEDROL) injection  60 mg Intravenous Q8H  . metoprolol succinate  50 mg Oral QHS  . pantoprazole  40 mg Oral Daily  . potassium chloride  20 mEq Oral BID  . tiotropium  18 mcg Inhalation Daily   Continuous Infusions: . sodium chloride 100 mL/hr at 05/19/19 2256  . azithromycin Stopped (05/20/19 0100)  . cefTRIAXone (ROCEPHIN)  IV Stopped (05/19/19 2324)   PRN Meds:acetaminophen **OR** acetaminophen, albuterol, chlorpheniramine-HYDROcodone, magnesium hydroxide, morphine injection, nitroGLYCERIN, ondansetron **OR** ondansetron (ZOFRAN) IV, traZODone  Assessment/Plan: 1.  Community-acquired pneumonia.  He has subsequent mild COPD acute exacerbation.   - community-acquired pneumonia and Cont IV Rocephin and Zithromax.  - Mucolytic therapy be provided as well as duo nebs q.i.d. and q.4 hours p.r.n.Marland Kitchen  - Sputum Gram stain culture and sensitivity -- Pending  - We will continue Spiriva and place the patient on scheduled and as needed duo nebs.  -  We will continue his steroid therapy with IV Solu-Medrol.   2.  Chest pain, rule out acute coronary syndrome.  - Resolved and not likely cardiac origin  - significant risk factor, cardiology consulted: no further work-up indicated at this time -Appreciate cardio input - Continue Toprol-XL and Imdur, ppi  3. Chronic diastolic CHF with pulmonary HTN -Not acute or sending -Patient has chronic shortness of breath even though his saturation has been above 92% consistently -We will discontinue IV hydration - Continue to monitor I/O, daily weights -Low-salt diet  4. Acute kidney injury superimposed on stage IIIb chronic kidney disease.   -Improving to baseline  -Patient received IV duration -Continue p.o. intake and monitor renal function  5.  Hypokalemia and hypomagnesemia   Status post replacement -Need to check  6.  Type 2 diabetes mellitus.  We will continue to new basal coverage and place the patient on supplemental coverage with NovoLog. -A1c found to be 7 -Diabetic diet with low salt   6.  Hypertension.  We will continue his antihypertensives.  7.  DVT prophylaxis.   Lovenox.  CODE STATUS: I discussed the CODE STATUS with the patient and he desires to be full code.   LOS: 1 day   Renly Guedes Izetta Dakin

## 2019-05-20 NOTE — ED Notes (Signed)
Pt resting in bed, no complaints, asking for a diet coke. Pillow provided, bed locked and low. IV infusing per order.

## 2019-05-20 NOTE — ED Notes (Signed)
[id number] 

## 2019-05-20 NOTE — Consult Note (Addendum)
Cardiology Consultation:   Patient ID: Larry Callahan MRN: [id number]; DOB: 17-Apr-1956  Admit date: 05/19/2019 Date of Consult: 05/20/2019  Primary Care Provider: [name], NP Primary Cardiologist: [name], MD  Primary Electrophysiologist:  None    Patient Profile:   Larry Callahan is a 63 y.o. 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 Sidney Ace.  History of Present Illness:   Larry Callahan is a 63 year old male with PMH as above.  He has history of nonobstructive CAD by [facility] 2011. He has had multiple monthly admissions for SOB/DOE. This has been thought to be multifactorial as outlined below and in the setting of known COPD and anemia with possible OHS and physical deconditioning. It has also been noted that, during each ED visit, BP has been suboptimally controlled in the 150s over 90s.   He was seen in the ED 02/2019 for shortness of breath over the last 3 months that was constant in nature.  CTA chest was negative for pulmonary embolism, aortic atherosclerosis, and CAC. He was diuresed with IV Lasix and discharged home.   He was admitted mid 02/2019 with acute on chronic diastolic heart failure.  Echo showed EF 55 to 60%.  Lexiscan Myoview was without ischemia but read as high risk secondary to reduced LVEF that was later clarified by same day echo showing nl EF. His symptoms improved with diuresis and he was discharged home.  At hospital follow-up, he was reportedly doing well with weight at 121  kg.  He was seen in the ED 2 days later with shortness of breath and work-up unrevealing.  He was admitted again mid 03/2019 with continued dyspnea, substernal chest discomfort at rest, intermittent left occipital headache at night and associated with night sweats, and multiple complaints regarding his "sugar pills." He was noted to have not been compliant with his Lasix.  CT head nonacute.  VQ scan negative for PE. RHC showed normal right heart pressures as copied and pasted below.  Symptoms were felt to be multifactorial, including his COPD, obesity with possible OHS, physical deconditioning, and anemia.  It was recommended he be evaluated by pulmonology for COPD and given his long smoking history.  He was seen at the end of 03/2019 with cough and hemoptysis.  Work-up was unrevealing with stable repeat VQ scan and negative lower extremity duplex.  He noted some improvement with inhalers.  He was admitted 2 days later with continued shortness of breath, hemoptysis, and intermittent melena.  Hemoglobin stable.  He received IV iron and underwent EGD/colonoscopy with noted poor bowel prep and patient refusal for repeat study while admitted.  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  Symptoms were felt to be multifactorial.  He was seen in the ED 04/23/2019 with BRBPR and negative guaiac on exam and stable hemoglobin of 10.6.  Outpatient follow-up with GI was advised.  He was admitted to the hospital 1 day later with continued dyspnea, likely multifactorial and exacerbated by dietary noncompliance.  CT renal stone showed no acute abnormality with marked sigmoid diverticulosis without diverticulitis, incidental  left adrenal mass that had increased in size, moderate to large fat containing umbilical hernia, and aortic atherosclerosis.  Repeat echo 04/27/2019 showed EF 45 to 50%, mild LVH, normal RV SF, moderately dilated left atrium, and mild to moderate aortic valve sclerosis without stenosis.  Echo  quality was noted suboptimal.  He was diuresed with UOP of 5.9 L for the admission.  He continued to note chest pain and dyspnea, however.  He was most recently admitted for recurrent hemoptysis with discharge 1 day previous to his most recent [facility] visit and on 12/5.  Pulmonology was consulted with recommendation to reach out to ENT service.  ENT provided flexible endoscopy on 12/3 with no evidence of mass or intranasal bleeding in the upper respiratory tract.  12 4 bronchoscopy was performed and normal.  The etiology of the hemoptysis was unclear and with consideration of studies was performed above at previous admissions.  Plan was for PCP and pulmonology follow-up.  Yesterday, 05/19/2019, he reported to [facility] ED after he felt chest tightness across his anterior chest and numbness that started while he was on the couch in the afternoon. He rated it 7/10 in intensity.  This chest tightness was not made worse with deeper breathing or palpation.  Associated symptoms included shortness of breath and lower extremity edema.  He denied any associated diaphoresis, presyncope, syncope, nausea, emesis, diarrhea, or constipation.  He reported ongoing and nonproductive cough, which she stated was present on his previous admission.  He denied any further BRBPR or melena, as well as further hemoptysis.  He reported medication compliance.  He continued to not smoke cigarettes and denied any alcohol or drug use. He reported that his chest has continued; however, he did note improvement to 3/10 with "whatever was in the bag." He further noted, "I'm not having a heart attack, I can tell you that."  In the ED, labs significant for Na 139, potassium 3.0  3.4, Mg 2.0, glucose 204  154.2, creatinine 2.56  1.82, BUN 55  50, BNP 143.0, high-sensitivity troponin peaking at 19 and down trending.  Chest x-ray showed patchy bibasilar infiltrates.  Lumbar spine and hip imaging without acute findings. Wt similar to last outpatient clinic  weight and 121.3kg as above and 122 kg and 119kg at time of 02/2019 and 03/2019 previous consult notes.   Heart Pathway Score:     Past Medical History:  Diagnosis Date   Allergy    Coronary artery disease    Diabetes mellitus without complication (HCC)    Diastolic CHF ([place])    GERD (gastroesophageal reflux disease)    Hyperlipidemia    Hypertension    Renal insufficiency    Sleep apnea     Past Surgical History:  Procedure Laterality Date   BACK SURGERY     CHOLECYSTECTOMY     COLONOSCOPY WITH PROPOFOL N/A 04/12/2019   Procedure: COLONOSCOPY WITH PROPOFOL;  Surgeon: [name], MD;  Location: [name] ENDOSCOPY;  Service: Gastroenterology;  Laterality: N/A;   ESOPHAGOGASTRODUODENOSCOPY N/A 04/12/2019   Procedure: ESOPHAGOGASTRODUODENOSCOPY (EGD);  Surgeon: [name], MD;  Location: [facility] ENDOSCOPY;  Service: Gastroenterology;  Laterality: N/A;   FLEXIBLE BRONCHOSCOPY Bilateral 05/17/2019   Procedure: FLEXIBLE BRONCHOSCOPY;  Surgeon: [name], MD;  Location: [name] ORS;  Service: Pulmonary;  Laterality: Bilateral;   left arm surgery     nephrectomy Left    PARATHYROIDECTOMY     RIGHT HEART CATH N/A 03/29/2019   Procedure: RIGHT HEART CATH;  Surgeon: [name],  MD;  Location: [place] CV LAB;  Service: Cardiovascular;  Laterality: N/A;     Home Medications:  Prior to Admission medications   Medication Sig Start Date End Date Taking? Authorizing Provider  amLODipine (NORVASC) 10 MG tablet Take 1 tablet (10 mg total) by mouth daily. 03/12/19  Yes [name], MD  ferrous gluconate (FERGON) 324 MG tablet Take 1 tablet (324 mg total) by mouth daily with breakfast. 12/31/18  Yes Boscia, Heather E, NP  Fluticasone-Salmeterol (ADVAIR DISKUS) 250-50 MCG/DOSE AEPB Inhale 1 puff into the lungs 2 (two) times daily. 03/05/19 03/04/20 Yes Gouru, Illene Silver, MD  furosemide (LASIX) 80 MG tablet Take 1 tablet (80 mg total) by mouth daily. 04/13/19   Yes Hillary Bow, MD  gemfibrozil (LOPID) 600 MG tablet Take 1 tablet (600 mg total) by mouth 2 (two) times daily with a meal. 02/28/19  Yes Boscia, Heather E, NP  glimepiride (AMARYL) 2 MG tablet Take 1 tablet (2 mg total) by mouth daily before breakfast. 03/13/19  Yes Boscia, Heather E, NP  hydrALAZINE (APRESOLINE) 100 MG tablet Take 1 tablet (100 mg total) by mouth 3 (three) times daily. 03/31/19  Yes Gouru, Aruna, MD  Insulin Glargine, 1 Unit Dial, (TOUJEO SOLOSTAR) 300 UNIT/ML SOPN Inject 26 Units into the skin every evening. 03/13/19 06/11/19 Yes Boscia, Heather E, NP  ipratropium-albuterol (DUONEB) 0.5-2.5 (3) MG/3ML SOLN Take 3 mLs by nebulization every 6 (six) hours as needed. 03/08/19  Yes Scarboro, Audie Clear, NP  isosorbide mononitrate (IMDUR) 30 MG 24 hr tablet Take 1 tablet (30 mg total) by mouth 2 (two) times daily. 04/13/19  Yes Sudini, Alveta Heimlich, MD  loratadine (CLARITIN) 10 MG tablet Take 1 tablet (10 mg total) by mouth daily. 04/01/19  Yes Gouru, Illene Silver, MD  metoprolol succinate (TOPROL-XL) 50 MG 24 hr tablet Take 1 tablet (50 mg total) by mouth at bedtime. Take with or immediately following a meal. 03/12/19  Yes Gollan, Kathlene November, MD  omeprazole (PRILOSEC) 40 MG capsule Take 1 capsule (40 mg total) by mouth daily. 03/01/19  Yes Scarboro, Audie Clear, NP  acetaminophen (TYLENOL) 325 MG tablet Take 2 tablets (650 mg total) by mouth every 4 (four) hours as needed for headache or mild pain. 03/05/19   Gouru, Illene Silver, MD  albuterol (VENTOLIN HFA) 108 (90 Base) MCG/ACT inhaler Inhale 2 puffs into the lungs every 6 (six) hours as needed for wheezing or shortness of breath. 03/05/19   Nicholes Mango, MD  Blood Glucose Calibration (TRUE METRIX LEVEL 1) Low SOLN Use as directed 04/04/18   [name], NP  Blood Glucose Monitoring Suppl (TRUE METRIX AIR GLUCOSE METER) DEVI 1 Device by Does not apply route 2 (two) times daily. 04/13/18   [name], NP  clarithromycin (BIAXIN) 500 MG tablet Take 1 tablet  (500 mg total) by mouth every 12 (twelve) hours for 7 days. 05/13/19 05/20/19  Marcell Anger, MD  clobetasol cream (TEMOVATE) 8.24 % Apply 1 application topically 2 (two) times daily. Patient not taking: Reported on 05/19/2019 12/31/18   [name], NP  ferrous sulfate 325 (65 FE) MG EC tablet Take 1 tablet (325 mg total) by mouth 2 (two) times daily with a meal. 04/13/19 05/13/19  Hillary Bow, MD  glucose blood (TRUE METRIX BLOOD GLUCOSE TEST) test strip Use as instructed twice daily diag E11.65 05/15/19   [name], NP  nicotine (NICODERM CQ - DOSED IN MG/24 HOURS) 21 mg/24hr patch Place 1 patch (21 mg total) onto the  skin daily. Patient not taking: Reported on 05/16/2019 12/31/18   [name], NP  NOVOFINE 32G X 6 MM MISC To use with [place] injections daily 12/13/18   [name], NP  predniSONE (DELTASONE) 20 MG tablet Take 1 tablet (20 mg total) by mouth daily. Patient not taking: Reported on 05/18/2019 04/29/19   Caren Griffins, MD  tiotropium (SPIRIVA HANDIHALER) 18 MCG inhalation capsule Place 1 capsule (18 mcg total) into inhaler and inhale daily. 04/13/19 05/16/19  Hillary Bow, MD  triamcinolone ointment (KENALOG) 0.1 % Apply 1 application topically 2 (two) times daily. Patient not taking: Reported on 05/19/2019 11/23/18   [name], NP  TRUEPLUS LANCETS 33G MISC Use as directed twice daily diag E11.65 04/04/18   [name], NP    Inpatient Medications: Scheduled Meds:  amLODipine  10 mg Oral Daily   enoxaparin (LOVENOX) injection  40 mg Subcutaneous Q24H   ferrous gluconate  324 mg Oral Q breakfast   furosemide  80 mg Oral Daily   gemfibrozil  600 mg Oral BID WC   glimepiride  2 mg Oral QAC breakfast   guaiFENesin  600 mg Oral BID   hydrALAZINE  100 mg Oral TID   insulin aspart  0-15 Units Subcutaneous TID WC   insulin glargine  15 Units Subcutaneous QHS   ipratropium-albuterol  3 mL Nebulization QID   isosorbide  mononitrate  30 mg Oral BID   loratadine  10 mg Oral Daily   methylPREDNISolone (SOLU-MEDROL) injection  60 mg Intravenous Q8H   metoprolol succinate  50 mg Oral QHS   pantoprazole  40 mg Oral Daily   tiotropium  18 mcg Inhalation Daily   Continuous Infusions:  sodium chloride 100 mL/hr at 05/19/19 2256   azithromycin Stopped (05/20/19 0100)   cefTRIAXone (ROCEPHIN)  IV Stopped (05/19/19 2324)   PRN Meds: acetaminophen **OR** acetaminophen, albuterol, chlorpheniramine-HYDROcodone, magnesium hydroxide, morphine injection, nitroGLYCERIN, ondansetron **OR** ondansetron (ZOFRAN) IV, traZODone  Allergies:    Allergies  Allergen Reactions   Penicillins Rash    Social History:   Social History   Socioeconomic History   Marital status: Single    Spouse name: Not on file   Number of children: Not on file   Years of education: Not on file   Highest education level: Not on file  Occupational History   Not on file  Social Needs   Financial resource strain: Not on file   Food insecurity    Worry: Not on file    Inability: Not on file   Transportation needs    Medical: Not on file    Non-medical: Not on file  Tobacco Use   Smoking status: Former Smoker   Smokeless tobacco: Former Systems developer    Types: Chew  Substance and Sexual Activity   Alcohol use: No    Frequency: Never   Drug use: No   Sexual activity: Not on file  Lifestyle   Physical activity    Days per week: Not on file    Minutes per session: Not on file   Stress: Not on file  Relationships   Social connections    Talks on phone: Not on file    Gets together: Not on file    Attends religious service: Not on file    Active member of club or organization: Not on file    Attends meetings of clubs or organizations: Not on file    Relationship status: Not on file   Intimate partner  violence    Fear of current or ex partner: Not on file    Emotionally abused: Not on file    Physically abused:  Not on file    Forced sexual activity: Not on file  Other Topics Concern   Not on file  Social History Narrative   Not on file    Family History:    Family History  Problem Relation Age of Onset   Diabetes Mother    Lung cancer Father    Diabetes Sister    Hypertension Sister    Heart disease Sister    Cancer Sister    Bone cancer Brother      ROS:  Please see the history of present illness.  Review of Systems  Constitutional: Positive for malaise/fatigue. Negative for chills, diaphoresis and fever.  Respiratory: Positive for cough, shortness of breath and wheezing.        Recent hemoptysis but not current  Cardiovascular: Positive for chest pain, orthopnea and leg swelling. Negative for palpitations.  Gastrointestinal: Negative for abdominal pain, blood in stool, constipation, diarrhea, melena, nausea and vomiting.  Genitourinary: Negative for hematuria.  Musculoskeletal: Negative for falls.  Neurological: Negative for dizziness and loss of consciousness.  All other systems reviewed and are negative.   All other ROS reviewed and negative.     Physical Exam/Data:   Vitals:   05/19/19 2048 05/20/19 0544 05/20/19 0759 05/20/19 1118  BP: (!) 167/96 140/61 (!) 153/83 (!) 148/82  Pulse: 80 61 73 67  Resp: (!) 22 18 (!) 22 16  Temp:  98.7 F (37.1 C)    TempSrc:  Oral    SpO2: 96%  96%   Weight:      Height:        Intake/Output Summary (Last 24 hours) at 05/20/2019 1140 Last data filed at 05/20/2019 0100 Gross per 24 hour  Intake 850 ml  Output --  Net 850 ml   Last 3 Weights 05/19/2019 05/15/2019 05/13/2019  Weight (lbs) 267 lb 6.7 oz 267 lb 6.7 oz 267 lb 6.7 oz  Weight (kg) 121.3 kg 121.3 kg 121.3 kg  Some encounter information is confidential and restricted. Go to Review Flowsheets activity to see all data.     Body mass index is 37.3 kg/m.  General: Obese male, no acute distress HEENT: normal Neck: JVD difficult to assess due to body  habitus Vascular: No carotid bruits; radial pulses 2+ bilaterally Cardiac:  normal S1, S2; distant heart sounds, RRR; 1/6 systolic murmur Lungs:  Reduced respiratory effort, no wheezing, rhonchi or rales  Abd: obese, no hepatomegaly  Ext: 1+ bilateral LEE Musculoskeletal:  No deformities, BUE and BLE strength normal and equal Skin: warm and dry  Neuro:  No focal abnormalities noted Psych:  Normal affect   EKG:  The EKG was personally reviewed and demonstrates:  normal sinus rhythm, 75 bpm, T wave inversion in aVL, nonspecific ST and T wave abnormality with repolarization changes due to LVH and no acute ST/T changes Telemetry:  Telemetry was personally reviewed and demonstrates: NSR, 60s, rare PAC  Relevant CV Studies: 2D echo 04/27/2019: 1. Left ventricular ejection fraction, by visual estimation, is 45 to 50%. The left ventricle has mildly decreased function. There is mildly increased left ventricular hypertrophy. 2. Left ventricular diastolic parameters are indeterminate. 3. Global right ventricle has normal systolic function.The right ventricular size is normal. No increase in right ventricular wall thickness. 4. Left atrial size was moderately dilated. 5. Right atrial size was normal. 6.  The mitral valve is normal in structure. No evidence of mitral valve regurgitation. No evidence of mitral stenosis. 7. The tricuspid valve is normal in structure. Tricuspid valve regurgitation is not demonstrated. 8. The aortic valve is normal in structure. Aortic valve regurgitation is not visualized. Mild to moderate aortic valve sclerosis/calcification without any evidence of aortic stenosis. 9. The pulmonic valve was normal in structure. Pulmonic valve regurgitation is not visualized. 10. TR signal is inadequate for assessing pulmonary artery systolic pressure. 11. The inferior vena cava is normal in size with greater than 50% respiratory variability, suggesting right atrial pressure of 3  mmHg. __________  RHC 03/29/2019: Pulmonary capillary wedge pressure mean of 8 PA: 32/16 mean 23 RA mean of 10 RV 26/12/13   Cardiac output 8.17 Cardiac index 3.42  Final Conclusions:  Normal right heart pressures  Recommendations:  Etiology of his shortness of breath likely multifactorial He is at euvolemic state at this time, no aggressive diuresis needed Needs referral to pulmonary for COPD work-up, long smoking history He may benefit from lung Works, exercise as needed sedentary lifestyle and is deconditioned Recommended weight loss, lifestyle modification given obesity contributing to his shortness of breath symptoms __________  2D echo 03/03/2019: 1. Left ventricular ejection fraction, by visual estimation, is 55 to 60%. The left ventricle has normal function. Normal left ventricular size. Left ventricular septal wall thickness was mildly increased. Mildly increased left ventricular posterior  wall thickness. There is mildly increased left ventricular hypertrophy. 2. Elevated left ventricular end-diastolic pressure. 3. Left ventricular diastolic Doppler parameters are consistent with pseudonormalization pattern of LV diastolic filling. 4. Global right ventricle has normal systolic function.The right ventricular size is normal. No increase in right ventricular wall thickness. 5. Left atrial size was severely dilated. 6. Right atrial size was normal. 7. The mitral valve is normal in structure. Trace mitral valve regurgitation. No evidence of mitral stenosis. 8. The tricuspid valve is normal in structure. Tricuspid valve regurgitation is trivial. 9. The aortic valve is normal in structure. Aortic valve regurgitation was not visualized by color flow Doppler. Mild aortic valve sclerosis without stenosis. 10. The pulmonic valve was normal in structure. Pulmonic valve regurgitation is trivial by color flow Doppler. 11. Moderately elevated pulmonary artery systolic  pressure. 12. The inferior vena cava is dilated in size with >50% respiratory variability, suggesting right atrial pressure of 8 mmHg. ___________  Nuclear stress test 03/02/2019:  This is a high risk study due to reduced systolic function. There is no ischemia.  There was no ST segment deviation noted during stress.  The left ventricular ejection fraction is severely decreased (<30%). However the veracity of these findings is unclear given poor tracer uptake.  Resting images are poor due to reduced tracer uptake.  Recommend echo for systolic function assessment.  Laboratory Data:  High Sensitivity Troponin:   Recent Labs  Lab 04/27/19 1745 05/09/19 0911 05/19/19 1522 05/19/19 1748 05/20/19 0929  TROPONINIHS 13 15 18* 19* 16     Cardiac EnzymesNo results for input(s): TROPONINI in the last 168 hours. No results for input(s): TROPIPOC in the last 168 hours.  Chemistry Recent Labs  Lab 05/18/19 0736 05/19/19 1522 05/20/19 0311 05/20/19 0929  NA 135 139 139  --   K 3.4* 3.0* 2.9* 3.4*  CL 104 106 107  --   CO2 _0 --   GLUCOSE 161* 204* 98  --   BUN 34* 55* 50*  --   CREATININE 1.73* 2.56* 1.82*  --  CALCIUM 8.4* 8.9 8.6*  --   GFRNONAA 41* 25* 38*  --   GFRAA 47* 29* 44*  --   ANIONGAP _0 --     No results for input(s): PROT, ALBUMIN, AST, ALT, ALKPHOS, BILITOT in the last 168 hours. Hematology Recent Labs  Lab 05/18/19 0736 05/19/19 1522 05/20/19 0311  WBC 7.0 8.0 7.6  RBC 3.75* 4.01* 3.70*  HGB 10.3* 11.1* 10.2*  HCT 33.0* 35.2* 31.1*  MCV 88.0 87.8 84.1  MCH 27.5 27.7 27.6  MCHC 31.2 31.5 32.8  RDW 16.3* 16.2* 16.1*  PLT 207 210 189   BNP Recent Labs  Lab 05/19/19 1522  BNP 143.0*    DDimer No results for input(s): DDIMER in the last 168 hours.   Radiology/Studies:  Dg Chest 2 View  Result Date: 05/19/2019 CLINICAL DATA:  Chest pain. EXAM: CHEST - 2 VIEW COMPARISON:  Chest x-ray 05/16/2019 and chest CT 05/09/2019 FINDINGS: The  cardiac silhouette, mediastinal and hilar contours are stable. Stable tortuosity of the thoracic aorta. There are new patchy bibasilar infiltrates. No pleural effusions or pulmonary lesions. The bony thorax is intact. IMPRESSION: Patchy bibasilar infiltrates. Electronically Signed   By: Marijo Sanes M.D.   On: 05/19/2019 16:41   Dg Lumbar Spine 2-3 Views  Result Date: 05/19/2019 CLINICAL DATA:  Low back pain status post recent fall. EXAM: LUMBAR SPINE - 2-3 VIEW COMPARISON:  06/26/2010 FINDINGS: There is no acute osseous abnormality. Multilevel advanced degenerative changes are noted throughout the visualized portions of the thoracolumbar spine. Facet arthrosis is noted at the lower lumbar segments. Vascular calcifications are noted. Multiple surgical clips project over the patient's abdomen. IMPRESSION: 1. No acute osseous abnormality. 2. Advanced multilevel degenerative changes are noted throughout the thoracolumbar spine. Electronically Signed   By: Constance Holster M.D.   On: 05/19/2019 19:12   Dg Hip Unilat With Pelvis 2-3 Views Right  Result Date: 05/19/2019 CLINICAL DATA:  Low back and hip pain after falling EXAM: DG HIP (WITH OR WITHOUT PELVIS) 2-3V RIGHT COMPARISON:  04/29/2019 FINDINGS: Osseous demineralization. Degenerative changes of the hip joints bilaterally with joint space narrowing and spur formation greater on RIGHT. SI joints preserved. No definite fracture, dislocation or bone destruction. A lucency traversing the subcapital region of the RIGHT femoral neck on the frog-leg lateral view is unchanged since the previous exam and suspect is related to superimposition of the acetabulum. IMPRESSION: No acute osseous abnormalities. Osseous demineralization with degenerative changes of the hip joints bilaterally greater on RIGHT. Electronically Signed   By: Lavonia Dana M.D.   On: 05/19/2019 19:18    Assessment and Plan:   Chest pain and shortness of breath without elevated  troponin --Continues to note chest tightness and SOB, though CP improved to 3/10 after receiving fluids in the ED.  Patient has a history of repeated admissions due to shortness of breath and chest pain, likely multifactorial in etiology and including possible short segment Barrett's as seen on previous EGD/GI workup, COPD/OSA, anemia, poorly controlled BP, hyperglycemia, and AOCKD. Previous echo as above with normal EF.  Left heart cath in 2011 showed nonobstructive CAD.  Lexiscan 02/2019 without evidence of ischemia and read high risk only in the setting of reduced EF, which was clarified as in the setting of poor tracer uptake fall. --EKG without acute changes from previous.  High-sensitivity troponin peaked at 19.  Not consistent with ACS.   --No plan for further ischemic work-up. No need for heparin. Continue medical management  with PTA  Imdur, PRN SL nitro, and BB. Continue PPI. Daily BMET.   Chronic diastolic CHF with pulmonary HTN --Patient reports continued SOB, likely multifactorial as outlined above. Consider COPD, anemia, obesity/OHS, poorly controlled BP, hyperglycemia, and AOCKD with 1 kidney. BNP is not terribly elevated. Previous PE workup as above negative. Previous GI workup as above without signs of active bleed. Previous RHC as above with nl right heart pressures. Known history of poor compliance with patient reporting he has been recently compliant today. --Patient does not appear terribly volume overloaded with consideration of recent fluids received in ED due to [facility] as below. Also considered is obesity. Noted LEE on exam with concurrent IVF and oral diuretic. --Discontinued fluids as renal function improved today and known history of pulmonary HTN. --Continue medical management. Reasonable to continue with restarted oral lasix and daily BMET. As above, discontinued fluids given LEE on exam. Caution with aggressive diuresis given AOCKD. Continue CPAP at night. Of note, cutoff for ReDS  vest is BMI 38 with patient 37.30; therefore, vest results may or may not prove accurate.  Continue to monitor I/O, daily weights. Weight 121.3 kg with previous clinic weight 121 kg and last consult notes at 122 and 119 kg.    HTN --BP poorly controlled at presentation and still sub-optimally controlled. Consider also patient recently received fluids, which may be contributing to elevated BP despite restart of home meds. Consider elevated BP as contributing to sx. Continue medical management / PTA medications amlodipine, Imdur, lasix, hydralazine.  Morbid obesity with sleep apnea  --CPAP compliance stressed.  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 Baseline 1.6-2.0 with Cr 2.56 at presentation. With improved renal function today, IVF discontinued. IM restarted oral diuretic, and as above, recommend against aggressive diuresis given AOCKD.   Hypokalemia --Replete with goal 4.0.. K 2.9  3.4. Mg at goal 2.0.  Iron deficiency anemia with recent hemoptysis --As detailed above, extensive work-up for hemoptysis unrevealing.  Hgb stable from previous. Continue to monitor with daily CBC.  Patient reports no further melena or hemoptysis.  Consider chronic anemia is playing a role in his symptoms.  Insulin-dependent diabetes --Hyperglycemic at presentation. Could contribute to sx. SSI, per IM.  For questions or updates, please contact [place] Please consult [email or web address] for contact info under     Signed, Arvil Chaco, PA-C  05/20/2019 11:40 AM

## 2019-05-21 ENCOUNTER — Telehealth: Payer: Self-pay | Admitting: Family

## 2019-05-21 DIAGNOSIS — I5033 Acute on chronic diastolic (congestive) heart failure: Secondary | ICD-10-CM

## 2019-05-21 LAB — MAGNESIUM: Magnesium: 2.1 mg/dL (ref 1.7–2.4)

## 2019-05-21 LAB — BASIC METABOLIC PANEL
Anion gap: 12 (ref 5–15)
BUN: 50 mg/dL — ABNORMAL HIGH (ref 8–23)
CO2: 18 mmol/L — ABNORMAL LOW (ref 22–32)
Calcium: 8.8 mg/dL — ABNORMAL LOW (ref 8.9–10.3)
Chloride: 103 mmol/L (ref 98–111)
Creatinine, Ser: 2.08 mg/dL — ABNORMAL HIGH (ref 0.61–1.24)
GFR calc Af Amer: 38 mL/min — ABNORMAL LOW (ref >60)
GFR calc non Af Amer: 32 mL/min — ABNORMAL LOW (ref >60)
Glucose, Bld: 290 mg/dL — ABNORMAL HIGH (ref 70–99)
Potassium: 4.2 mmol/L (ref 3.5–5.1)
Sodium: 133 mmol/L — ABNORMAL LOW (ref 135–145)

## 2019-05-21 LAB — GLUCOSE, CAPILLARY
Glucose-Capillary: 303 mg/dL — ABNORMAL HIGH (ref 70–99)
Glucose-Capillary: 239 mg/dL — ABNORMAL HIGH (ref 70–99)
Glucose-Capillary: 236 mg/dL — ABNORMAL HIGH (ref 70–99)
Glucose-Capillary: 367 mg/dL — ABNORMAL HIGH (ref 70–99)

## 2019-05-21 LAB — TROPONIN I (HIGH SENSITIVITY): Troponin I (High Sensitivity): 11 ng/L (ref <18)

## 2019-05-21 MED ORDER — FUROSEMIDE 40 MG PO TABS
80.0000 mg | ORAL_TABLET | Freq: Two times a day (BID) | ORAL | Status: DC
  Administered 2019-05-21 – 2019-05-24 (×6): 80 mg via ORAL
  Filled 2019-05-21 (×6): qty 2

## 2019-05-21 NOTE — Progress Notes (Signed)
Inpatient Diabetes Program Recommendations  AACE/ADA: New Consensus Statement on Inpatient Glycemic Control (2015)  Target Ranges:  Prepandial:   less than 140 mg/dL      Peak postprandial:   less than 180 mg/dL (1-2 hours)      Critically ill patients:  140 - 180 mg/dL   Results for Larry Callahan, Larry Callahan (MRN [id number]) as of 05/21/2019 09:23  Ref. Range 05/20/2019 08:33 05/20/2019 12:34 05/20/2019 16:43 05/20/2019 21:33  Glucose-Capillary Latest Ref Range: 70 - 99 mg/dL 107 (H) 165 (H)  3 units NOVOLOG  222 (H)  5 units NOVOLOG  348 (H)    15 units LANTUS   Results for Larry Callahan, Larry Callahan (MRN [id number]) as of 05/21/2019 09:23  Ref. Range 05/21/2019 07:46  Glucose-Capillary Latest Ref Range: 70 - 99 mg/dL 303 (H)  11 units NOVOLOG    Results for Larry Callahan, Larry Callahan (MRN [id number]) as of 05/21/2019 09:23  Ref. Range 05/20/2019 03:11  Hemoglobin A1C Latest Ref Range: 4.8 - 5.6 % 7.0 (H)     Admit with: Pneumonia/ COPD Flare/ CP rule out ACS  History: DM, CHF, CKD  Home DM Meds: Toujeo 26 units QAM       Amaryl 2 mg Daily  Current Orders: Lantus 15 units QHS         Novolog Moderate Correction Scale/ SSI (0-15 units) TID AC       Amaryl 2 mg Daily            Note Solumedrol 60 mg Q8 hours started yesterday (12/07) at 3am.  CBGs have risen dramatically since initiation of IV steroids.    MD- Note CBG 303 mg/dl this AM likely due to Steroids.  Please consider the following while patient remains on IV Steroids:  1. Increase Lantus to 26 units QHS (full home dose)  2. Start Novolog Meal Coverage: Novolog 6 units TID with meals to start  (Please add the following Hold Parameters: Hold if pt eats <50% of meal, Hold if pt NPO)     --Will follow patient during hospitalization--  Wyn Quaker RN, MSN, CDE Diabetes Coordinator Inpatient Glycemic Control Team Team Pager: [phone] (8a-5p)

## 2019-05-21 NOTE — Telephone Encounter (Signed)
Error

## 2019-05-21 NOTE — Plan of Care (Signed)
  Problem: Education: Goal: Knowledge of General Education information will improve Description: Including pain rating scale, medication(s)/side effects and non-pharmacologic comfort measures Outcome: Progressing   Problem: Clinical Measurements: Goal: Respiratory complications will improve Outcome: Progressing Goal: Cardiovascular complication will be avoided Outcome: Progressing   

## 2019-05-21 NOTE — Progress Notes (Signed)
Brief course: 63 y.o. obese Caucasian male with a known history of coronary artery disease, type 2 diabetes mellitus, hypertension and dyslipidemia, who presented  with acute onset of pain across his chest described as pressure with radiation to the right forearm, increasing with exertion and associated cough productive of yellowish sputum which has been going on over the last week with associated chills and mild dyspnea with no hemoptysis. He was recently admitted here for hemoptysis and underwent a bronchoscopy by Dr. Humphrey Rolls on 05/17/2019 that was positive essentially normal. His SARS PCR was negative on 12/3. Portable chest x-ray showed patchy bibasal infiltrates.  Subjective: Was lying on bed.  BiPAP on.  Patient says he is still coughing.  He says he is not ready to go yet as he thinks he might come back as he did from his recent discharge.  Denies any fever.  Objective: Vital signs in last 24 hours: Temp:  [97.5 F (36.4 C)-99 F (37.2 C)] 98 F (36.7 C) (12/08 0745) Pulse Rate:  [69-81] 79 (12/08 0745) Resp:  [18-19] 19 (12/08 0745) BP: (122-148)/(56-92) 122/65 (12/08 0745) SpO2:  [92 %-99 %] 95 % (12/08 1140) Weight:  [121 kg] 121 kg (12/07 1405)  Intake/Output from previous day: 12/07 0701 - 12/08 0700 In: 1247.8 [P.O.:240; I.V.:657.8] Out: 1500 [Urine:1500] Intake/Output this shift: Total I/O In: -  Out: 650 [Urine:650]  GENERAL: Lying in the bed with no acute distress.  EYES: Pupils equal, round, reactive to light and accommodation. No scleral icterus. Extraocular muscles intact.  HEENT: Head atraumatic, normocephalic. Oropharynx and nasopharynx clear.  NECK:  Supple, no jugular venous distention. No thyroid enlargement, no tenderness.  LUNGS: Mild crackles and diminished expiratory airflow.   CARDIOVASCULAR: Regular rate and rhythm, S1, S2 normal. No murmurs, rubs, or gallops.  ABDOMEN: Soft, nondistended, nontender. Bowel sounds present. No organomegaly or mass.   EXTREMITIES: No pedal edema, cyanosis, or clubbing.  NEUROLOGIC: Distally normal PSYCHIATRIC: The patient is alert and oriented x 3.  Normal affect and good eye contact. SKIN: No obvious rash, lesion, or ulcer.   Results for orders placed or performed during the hospital encounter of 05/19/19 (from the past 24 hour(s))  Glucose, capillary     Status: Abnormal   Collection Time: 05/20/19  4:43 PM  Result Value Ref Range   Glucose-Capillary 222 (H) 70 - 99 mg/dL  Glucose, capillary     Status: Abnormal   Collection Time: 05/20/19  9:33 PM  Result Value Ref Range   Glucose-Capillary 348 (H) 70 - 99 mg/dL   Comment 1 Notify RN   Troponin I (High Sensitivity)     Status: None   Collection Time: 05/21/19  4:52 AM  Result Value Ref Range   Troponin I (High Sensitivity) 11 <18 ng/L  Glucose, capillary     Status: Abnormal   Collection Time: 05/21/19  7:46 AM  Result Value Ref Range   Glucose-Capillary 303 (H) 70 - 99 mg/dL  Basic metabolic panel     Status: Abnormal   Collection Time: 05/21/19  8:51 AM  Result Value Ref Range   Sodium 133 (L) 135 - 145 mmol/L   Potassium 4.2 3.5 - 5.1 mmol/L   Chloride 103 98 - 111 mmol/L   CO2 18 (L) 22 - 32 mmol/L   Glucose, Bld 290 (H) 70 - 99 mg/dL   BUN 50 (H) 8 - 23 mg/dL   Creatinine, Ser 2.08 (H) 0.61 - 1.24 mg/dL   Calcium 8.8 (L) 8.9 - 10.3  mg/dL   GFR calc non Af Amer 32 (L) >60 mL/min   GFR calc Af Amer 38 (L) >60 mL/min   Anion gap 12 5 - 15  Magnesium     Status: None   Collection Time: 05/21/19  8:51 AM  Result Value Ref Range   Magnesium 2.1 1.7 - 2.4 mg/dL  Glucose, capillary     Status: Abnormal   Collection Time: 05/21/19 11:31 AM  Result Value Ref Range   Glucose-Capillary 239 (H) 70 - 99 mg/dL    Studies/Results: Dg Chest 2 View  Result Date: 05/19/2019 CLINICAL DATA:  Chest pain. EXAM: CHEST - 2 VIEW COMPARISON:  Chest x-ray 05/16/2019 and chest CT 05/09/2019 FINDINGS: The cardiac silhouette, mediastinal and hilar  contours are stable. Stable tortuosity of the thoracic aorta. There are new patchy bibasilar infiltrates. No pleural effusions or pulmonary lesions. The bony thorax is intact. IMPRESSION: Patchy bibasilar infiltrates. Electronically Signed   By: Marijo Sanes M.D.   On: 05/19/2019 16:41   Dg Chest 2 View  Result Date: 05/16/2019 CLINICAL DATA:  Hemoptysis EXAM: CHEST - 2 VIEW COMPARISON:  May 09, 2019 FINDINGS: There is mild cardiomegaly. Aortic knob calcifications. Both lungs are clear. No acute osseous abnormality. IMPRESSION: No active cardiopulmonary disease. Electronically Signed   By: Prudencio Pair M.D.   On: 05/16/2019 00:35   Dg Chest 2 View  Result Date: 05/09/2019 CLINICAL DATA:  Spitting up blood with lower extremity edema and generalized chest and upper abdominal pain since this morning. EXAM: CHEST - 2 VIEW COMPARISON:  04/24/2019 FINDINGS: Lungs are adequately inflated and otherwise clear. Cardiomediastinal silhouette and remainder of the exam is unchanged. IMPRESSION: No active cardiopulmonary disease. Electronically Signed   By: Marin Olp M.D.   On: 05/09/2019 09:48   Dg Chest 2 View  Result Date: 04/24/2019 CLINICAL DATA:  complaints of shortness of breath, fatigue since this morning. Pt here for same recently, admission due to CHF. HTN, CHF, DM, former smoker EXAM: CHEST - 2 VIEW COMPARISON:  04/10/2019 FINDINGS: Mild central pulmonary vascular congestion and mild diffuse interstitial prominent, increased since previous. Heart size upper limits normal. Aortic Atherosclerosis (ICD10-170.0). No effusion. Anterior vertebral endplate spurring at multiple levels in the lower thoracic spine. IMPRESSION: Worsening central pulmonary vascular congestion and interstitial edema. Electronically Signed   By: Lucrezia Europe M.D.   On: 04/24/2019 12:25   Dg Lumbar Spine 2-3 Views  Result Date: 05/19/2019 CLINICAL DATA:  Low back pain status post recent fall. EXAM: LUMBAR SPINE - 2-3 VIEW  COMPARISON:  06/26/2010 FINDINGS: There is no acute osseous abnormality. Multilevel advanced degenerative changes are noted throughout the visualized portions of the thoracolumbar spine. Facet arthrosis is noted at the lower lumbar segments. Vascular calcifications are noted. Multiple surgical clips project over the patient's abdomen. IMPRESSION: 1. No acute osseous abnormality. 2. Advanced multilevel degenerative changes are noted throughout the thoracolumbar spine. Electronically Signed   By: Constance Holster M.D.   On: 05/19/2019 19:12   Ct Head Wo Contrast  Result Date: 04/29/2019 CLINICAL DATA:  Syncopal episode today with trauma to the right side of the head EXAM: CT HEAD WITHOUT CONTRAST TECHNIQUE: Contiguous axial images were obtained from the base of the skull through the vertex without intravenous contrast. COMPARISON:  03/26/2019 FINDINGS: Brain: The brain shows a normal appearance without evidence of malformation, atrophy, old or acute small or large vessel infarction, mass lesion, hemorrhage, hydrocephalus or extra-axial collection. Vascular: No hyperdense vessel. No evidence of atherosclerotic calcification.  Skull: Normal.  No traumatic finding.  No focal bone lesion. Sinuses/Orbits: Sinuses are clear. Orbits appear normal. Mastoids are clear. Other: None significant IMPRESSION: Normal head CT Electronically Signed   By: Nelson Chimes M.D.   On: 04/29/2019 16:59   Ct Chest Wo Contrast  Result Date: 05/09/2019 CLINICAL DATA:  Nonproductive cough, hemoptysis, bilateral leg swelling EXAM: CT CHEST WITHOUT CONTRAST TECHNIQUE: Multidetector CT imaging of the chest was performed following the standard protocol without IV contrast. COMPARISON:  Chest radiograph dated 05/09/2019. CTA chest dated 02/20/2019. FINDINGS: Cardiovascular: The heart is normal in size. No pericardial effusion. No evidence of thoracic aortic aneurysm. Atherosclerotic calcifications of the aortic arch. Coronary  atherosclerosis of the LAD. Mediastinum/Nodes: No suspicious mediastinal lymphadenopathy. Visualized thyroid is unremarkable. Lungs/Pleura: No suspicious pulmonary nodules. No focal consolidation. Mild centrilobular emphysematous changes in the bilateral upper lobes. No pleural effusion or pneumothorax. Upper Abdomen: Visualized upper abdomen is notable for bilateral renal cysts measuring up to 7.5 cm in the left upper kidney, incompletely visualized. Musculoskeletal: Degenerative changes of the visualized thoracolumbar spine. IMPRESSION: No evidence of acute cardiopulmonary disease. Aortic Atherosclerosis (ICD10-I70.0) and Emphysema (ICD10-J43.9). Electronically Signed   By: Julian Hy M.D.   On: 05/09/2019 11:50   Nm Pulmonary Perf And Vent  Result Date: 05/11/2019 CLINICAL DATA:  Shortness of breath and hemoptysis EXAM: NUCLEAR MEDICINE VENTILATION - PERFUSION LUNG SCAN VIEWS: Anterior, posterior, left lateral, right lateral, RPO, LPO, RAO, LAO-ventilation and perfusion RADIOPHARMACEUTICALS:  31.87 mCi of Tc-55mDTPA aerosol inhalation and 4.19 mCi Tc917mAA IV COMPARISON:  Chest radiograph May 08, 2016 FINDINGS: Ventilation: Radiotracer uptake is homogeneous and symmetric bilaterally. No focal ventilation defects are evident. Perfusion: Radiotracer uptake is homogeneous and symmetric bilaterally. No perfusion defects evident. IMPRESSION: No appreciable ventilation or perfusion defects. Very low probability of pulmonary embolus. Electronically Signed   By: WiLowella GripII M.D.   On: 05/11/2019 14:32   Ct Renal Stone Study  Result Date: 04/24/2019 CLINICAL DATA:  Left flank pain. Generalized abdominal pain. Bloody stools. EXAM: CT ABDOMEN AND PELVIS WITHOUT CONTRAST TECHNIQUE: Multidetector CT imaging of the abdomen and pelvis was performed following the standard protocol without IV contrast. COMPARISON:  01/31/2014 CT abdomen/pelvis. FINDINGS: Lower chest: No significant pulmonary  nodules or acute consolidative airspace disease. Hepatobiliary: Normal liver size. No liver mass. Cholecystectomy. No biliary ductal dilatation. Pancreas: Normal, with no mass or duct dilation. Spleen: Normal size spleen. Granulomatous calcifications throughout the spleen. No splenic mass. Adrenals/Urinary Tract: Normal right adrenal. 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 Left nephrectomy with no mass or fluid collection in the left nephrectomy bed. No right hydronephrosis. No right renal stones. Multiple simple right renal cysts, largest 7.7 cm in the posterior lower right kidney. Normal caliber right ureter. No right ureteral stones. Normal bladder with no bladder stones. Stomach/Bowel: Normal non-distended stomach. Normal caliber small bowel with no small bowel wall thickening. Normal appendix. Marked left colonic diverticulosis, with no large bowel wall thickening or significant pericolonic fat stranding. Vascular/Lymphatic: Atherosclerotic nonaneurysmal abdominal aorta. No pathologically enlarged lymph nodes in the abdomen or pelvis. Reproductive: Mildly enlarged prostate. Other: No pneumoperitoneum, ascites or focal fluid collection. Moderate to large fat containing umbilical hernia, slightly increased from prior. Chronic diastasis of posterior left lateral abdominal wall. Musculoskeletal: No aggressive appearing focal osseous lesions. Moderate thoracolumbar spondylosis. IMPRESSION: 1. No acute abnormality. No evidence of bowel  obstruction or acute bowel inflammation. Marked sigmoid diverticulosis, with no evidence of acute diverticulitis. 2. No right hydronephrosis.  No urolithiasis. 3. 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 Given the large size and continued growth, surgical consultation may be considered. 4.  Left nephrectomy. 5. Mild prostatomegaly. 6. Moderate to large fat containing umbilical hernia, slightly increased. 7.  Aortic Atherosclerosis (ICD10-I70.0). Electronically Signed   By: Ilona Sorrel M.D.   On: 04/24/2019 16:17   Dg Hip Unilat With Pelvis 2-3 Views Left  Result Date: 04/29/2019 CLINICAL DATA:  Fall EXAM: DG HIP (WITH OR WITHOUT PELVIS) 2-3V LEFT COMPARISON:  None. FINDINGS: Anatomic alignment is maintained. There is no acute fracture. Mild changes of osteoarthritis. IMPRESSION: No acute fracture. Electronically Signed   By: Macy Mis M.D.   On: 04/29/2019 12:06   Dg Hip Unilat With Pelvis 2-3 Views Right  Result Date: 05/19/2019 CLINICAL DATA:  Low back and hip pain after falling EXAM: DG HIP (WITH OR WITHOUT PELVIS) 2-3V RIGHT COMPARISON:  04/29/2019 FINDINGS: Osseous demineralization. Degenerative changes of the hip joints bilaterally with joint space narrowing and spur formation greater on RIGHT. SI joints preserved. No definite fracture, dislocation or bone destruction. A lucency traversing the subcapital region of the RIGHT femoral neck on the frog-leg lateral view is unchanged since the previous exam and suspect is related to superimposition of the acetabulum. IMPRESSION: No acute osseous abnormalities. Osseous demineralization with degenerative changes of the hip joints bilaterally greater on RIGHT. Electronically Signed   By: Lavonia Dana M.D.   On: 05/19/2019 19:18   Dg Hip Unilat With Pelvis 2-3 Views Right  Result Date: 04/29/2019 CLINICAL DATA:  Fall EXAM: DG HIP (WITH OR WITHOUT PELVIS) 2-3V RIGHT COMPARISON:  2011 FINDINGS: Alignment is anatomic. There is no acute fracture. Changes of osteoarthritis are present similar to 2011. IMPRESSION: No acute fracture at the right hip. Electronically Signed   By: Macy Mis M.D.   On: 04/29/2019 12:03    Scheduled Meds: . amLODipine  10 mg Oral Daily  . aspirin  81 mg Oral Daily  . enoxaparin (LOVENOX) injection  40 mg  Subcutaneous Q24H  . ferrous gluconate  324 mg Oral Q breakfast  . furosemide  80 mg Oral Daily  . gemfibrozil  600 mg Oral BID WC  . glimepiride  2 mg Oral QAC breakfast  . guaiFENesin  600 mg Oral BID  . hydrALAZINE  100 mg Oral TID  . insulin aspart  0-15 Units Subcutaneous TID WC  . insulin aspart  10 Units Subcutaneous Once  . insulin glargine  15 Units Subcutaneous QHS  . ipratropium-albuterol  3 mL Nebulization QID  . isosorbide mononitrate  30 mg Oral BID  . loratadine  10 mg Oral Daily  . methylPREDNISolone (SOLU-MEDROL) injection  60 mg Intravenous Q8H  . metoprolol succinate  50 mg Oral QHS  . pantoprazole  40 mg Oral Daily  . potassium chloride  20 mEq Oral BID  . tiotropium  18 mcg Inhalation Daily   Continuous Infusions: . sodium chloride 250 mL (05/20/19 2229)  . azithromycin 500 mg (05/20/19 2319)  . cefTRIAXone (ROCEPHIN)  IV 2 g (05/20/19 2229)   PRN Meds:sodium chloride, acetaminophen **OR** acetaminophen, albuterol, chlorpheniramine-HYDROcodone, magnesium hydroxide, morphine injection, nitroGLYCERIN, ondansetron **OR** ondansetron (ZOFRAN) IV, traZODone  Assessment/Plan: 1.  Community-acquired pneumonia.  He has subsequent mild COPD acute exacerbation.   - community-acquired pneumonia and  Cont IV Rocephin and Zithromax.  - Mucolytic therapy be provided as well as duo nebs q.i.d. and q.4 hours p.r.n.Marland Kitchen  - Sputum Gram stain culture and sensitivity -- Pending  - BAL from 05/17/19 shows no growth  - We will continue Spiriva and place the patient on scheduled and as needed duo nebs.  - We will continue his steroid therapy with IV Solu-Medrol.   2.  Chest pain, rule out acute coronary syndrome.  - Resolved and not likely cardiac origin  - significant risk factor, cardiology consulted: no further work-up indicated at this time -Appreciate cardio input - Continue Toprol-XL and Imdur, ppi  3. Chronic diastolic CHF with pulmonary HTN -Not acute or  sending -Patient has chronic shortness of breath even though his saturation has been above 92% consistently -We will discontinue IV hydration - Continue to monitor I/O, daily weights -Low-salt diet  4. Acute kidney injury superimposed on stage IIIb chronic kidney disease.   -Improving to baseline  -Patient received IV duration -Continue encourage p.o. intake and monitor renal function  5.  Hypokalemia and hypomagnesemia   Status post replacement -Need to check  6.  Type 2 diabetes mellitus.  We will continue to new basal coverage and place the patient on supplemental coverage with NovoLog. -A1c found to be 7 -Diabetic diet with low salt   6.  Hypertension.  We will continue his antihypertensives.  7.  DVT prophylaxis.   Lovenox.  CODE STATUS: full code.   LOS: 2 days   Bora Broner Izetta Dakin

## 2019-05-21 NOTE — Evaluation (Signed)
Physical Therapy Evaluation Patient Details Name: Larry Callahan MRN: [id number] DOB: 1955/08/28 Today's Date: 05/21/2019   History of Present Illness  Pt admitted for complaints of chest pain. Other history includes CHF, HTN, COPD, and DM. Pt with multiple recent admission for similar issues. Now diagnosed with CAP.  Clinical Impression  Pt is a pleasant 63 year old male who was admitted for CAP with complaints of chest pain. Pt performs bed mobility/transfers with independence and ambulation with supervision and no AD. Vitals remain WNL during all exertion. Pt demonstrates all bed mobility/transfers/ambulation at baseline level. Pt does not require any further PT needs at this time. Pt will be dc in house and does not require follow up. RN aware. Will dc current orders.     Follow Up Recommendations No PT follow up    Equipment Recommendations  None recommended by PT    Recommendations for Other Services       Precautions / Restrictions Precautions Precautions: Fall Restrictions Weight Bearing Restrictions: No      Mobility  Bed Mobility Overal bed mobility: Independent             General bed mobility comments: ease of movement noted. ONce seated at EOB, upright posture noted  Transfers Overall transfer level: Independent Equipment used: None             General transfer comment: upright posture with no AD required. Good static stance  Ambulation/Gait Ambulation/Gait assistance: Supervision Gait Distance (Feet): 300 Feet Assistive device: None Gait Pattern/deviations: Step-through pattern     General Gait Details: ambulated with wide BOS. Slight unsteadiness with head turns or when he gets distracted. Good speed, although has difficulty self pacing. Slight SOB symptoms once return to room. HR at 92bpm with exertion  Stairs            Wheelchair Mobility    Modified Rankin (Stroke Patients Only)       Balance Overall balance  assessment: Needs assistance Sitting-balance support: Feet supported Sitting balance-Leahy Scale: Normal Sitting balance - Comments: good seated functional reach moving table out of way   Standing balance support: No upper extremity supported Standing balance-Leahy Scale: Fair Standing balance comment: unable to perform SLS for >2 seconds. Able to march in place safely.                             Pertinent Vitals/Pain Pain Assessment: No/denies pain    Home Living Family/patient expects to be discharged to:: Private residence Living Arrangements: Alone Available Help at Discharge: Family(sister lives nearby, provides transportation) Type of Home: Mobile home Home Access: Stairs to enter Entrance Stairs-Rails: Left Entrance Stairs-Number of Steps: 5 Home Layout: One level Home Equipment: None Additional Comments: has CPAP machine    Prior Function Level of Independence: Independent         Comments: reports he was fairly active. 1 fall in last 6 month     Hand Dominance        Extremity/Trunk Assessment   Upper Extremity Assessment Upper Extremity Assessment: Overall WFL for tasks assessed    Lower Extremity Assessment Lower Extremity Assessment: Overall WFL for tasks assessed       Communication   Communication: No difficulties  Cognition Arousal/Alertness: Awake/alert Behavior During Therapy: WFL for tasks assessed/performed Overall Cognitive Status: Within Functional Limits for tasks assessed  General Comments      Exercises Other Exercises Other Exercises: standing balance activities performed including SLS, 30 second sit<>Stand ( 9 reps ) with slight SOB symptoms noted.. Able to march in place in standing.   Assessment/Plan    PT Assessment Patent does not need any further PT services  PT Problem List         PT Treatment Interventions      PT Goals (Current goals can be  found in the Care Plan section)  Acute Rehab PT Goals Patient Stated Goal: to go home PT Goal Formulation: All assessment and education complete, DC therapy Time For Goal Achievement: 05/21/19 Potential to Achieve Goals: Good    Frequency     Barriers to discharge        Co-evaluation               AM-PAC PT "6 Clicks" Mobility  Outcome Measure Help needed turning from your back to your side while in a flat bed without using bedrails?: None Help needed moving from lying on your back to sitting on the side of a flat bed without using bedrails?: None Help needed moving to and from a bed to a chair (including a wheelchair)?: None Help needed standing up from a chair using your arms (e.g., wheelchair or bedside chair)?: None Help needed to walk in hospital room?: None Help needed climbing 3-5 steps with a railing? : None 6 Click Score: 24    End of Session Equipment Utilized During Treatment: Gait belt Activity Tolerance: Patient tolerated treatment well Patient left: in chair;with chair alarm set Nurse Communication: Mobility status PT Visit Diagnosis: Unsteadiness on feet (R26.81);Muscle weakness (generalized) (M62.81)    Time: 1610-9604 PT Time Calculation (min) (ACUTE ONLY): 24 min   Charges:   PT Evaluation $PT Eval Low Complexity: 1 Low PT Treatments $Therapeutic Activity: 8-22 mins        Greggory Stallion, PT, DPT [phone]   Larry Callahan 05/21/2019, 4:17 PM

## 2019-05-21 NOTE — Progress Notes (Signed)
Patient refusing bed alarm this afternoon. Stating "some doctor came in and told me I need to be moving around and don't need this alarm on". Educated on why it was turned on. Patient states he will call if he needs to ambulate in the room.

## 2019-05-21 NOTE — Progress Notes (Signed)
Progress Note  Patient Name: Larry Callahan Date of Encounter: 05/21/2019  Primary Cardiologist: [name], MD   Subjective   Continues to report CP, rated 3/10 in severity. CP is located in the center of his chest and further described as pleuritic in nature.   Notes improvement of breathing but still notes LEE and does not yet feel at baseline breathing status.   Inpatient Medications    Scheduled Meds:  amLODipine  10 mg Oral Daily   aspirin  81 mg Oral Daily   enoxaparin (LOVENOX) injection  40 mg Subcutaneous Q24H   ferrous gluconate  324 mg Oral Q breakfast   furosemide  80 mg Oral Daily   gemfibrozil  600 mg Oral BID WC   glimepiride  2 mg Oral QAC breakfast   guaiFENesin  600 mg Oral BID   hydrALAZINE  100 mg Oral TID   insulin aspart  0-15 Units Subcutaneous TID WC   insulin aspart  10 Units Subcutaneous Once   insulin glargine  15 Units Subcutaneous QHS   ipratropium-albuterol  3 mL Nebulization QID   isosorbide mononitrate  30 mg Oral BID   loratadine  10 mg Oral Daily   methylPREDNISolone (SOLU-MEDROL) injection  60 mg Intravenous Q8H   metoprolol succinate  50 mg Oral QHS   pantoprazole  40 mg Oral Daily   potassium chloride  20 mEq Oral BID   tiotropium  18 mcg Inhalation Daily   Continuous Infusions:  sodium chloride 250 mL (05/20/19 2229)   azithromycin 500 mg (05/20/19 2319)   cefTRIAXone (ROCEPHIN)  IV 2 g (05/20/19 2229)   PRN Meds: sodium chloride, acetaminophen **OR** acetaminophen, albuterol, chlorpheniramine-HYDROcodone, magnesium hydroxide, morphine injection, nitroGLYCERIN, ondansetron **OR** ondansetron (ZOFRAN) IV, traZODone   Vital Signs    Vitals:   05/21/19 0637 05/21/19 0745 05/21/19 0824 05/21/19 1140  BP: (!) 129/56 122/65    Pulse: 81 79    Resp: 19 19    Temp: 98.2 F (36.8 C) 98 F (36.7 C)    TempSrc: Oral Oral    SpO2: 94% 97% 95% 95%  Weight:      Height:        Intake/Output  Summary (Last 24 hours) at 05/21/2019 1452 Last data filed at 05/21/2019 1044 Gross per 24 hour  Intake 597.78 ml  Output 2150 ml  Net -1552.22 ml   Last 3 Weights 05/20/2019 05/19/2019 05/15/2019  Weight (lbs) 266 lb 11.2 oz 267 lb 6.7 oz 267 lb 6.7 oz  Weight (kg) 120.974 kg 121.3 kg 121.3 kg      Telemetry    NSR, 70-80s - Personally Reviewed  ECG    No new tracings- Personally Reviewed  Physical Exam   GEN: Obese male. No acute distress. Just woke from nap with BiPAP. Neck: JVP ~11cm.  Cardiac: RRR, no murmurs, rubs, or gallops.  Respiratory: Clear to auscultation bilaterally. No wheezing. GI: Soft, nontender, non-distended  MS: 1+ bilateral LEE; No deformity. Neuro:  Nonfocal  Psych: Normal affect   Labs    High Sensitivity Troponin:   Recent Labs  Lab 05/09/19 0911 05/19/19 1522 05/19/19 1748 05/20/19 0929 05/21/19 0452  TROPONINIHS 15 18* 19* 16 11      Cardiac EnzymesNo results for input(s): TROPONINI in the last 168 hours. No results for input(s): TROPIPOC in the last 168 hours.   Chemistry Recent Labs  Lab 05/19/19 1522 05/20/19 0311 05/20/19 0929 05/21/19 0851  NA 139 139  --  133*  K  3.0* 2.9* 3.4* 4.2  CL 106 107  --  103  CO2 22 23  --  18*  GLUCOSE 204* 98  --  290*  BUN 55* 50*  --  50*  CREATININE 2.56* 1.82*  --  2.08*  CALCIUM 8.9 8.6*  --  8.8*  GFRNONAA 25* 38*  --  32*  GFRAA 29* 44*  --  38*  ANIONGAP 11 9  --  12     Hematology Recent Labs  Lab 05/18/19 0736 05/19/19 1522 05/20/19 0311  WBC 7.0 8.0 7.6  RBC 3.75* 4.01* 3.70*  HGB 10.3* 11.1* 10.2*  HCT 33.0* 35.2* 31.1*  MCV 88.0 87.8 84.1  MCH 27.5 27.7 27.6  MCHC 31.2 31.5 32.8  RDW 16.3* 16.2* 16.1*  PLT 207 210 189    BNP Recent Labs  Lab 05/19/19 1522  BNP 143.0*     DDimer No results for input(s): DDIMER in the last 168 hours.   Radiology    Dg Chest 2 View  Result Date: 05/19/2019 CLINICAL DATA:  Chest pain. EXAM: CHEST - 2 VIEW COMPARISON:   Chest x-ray 05/16/2019 and chest CT 05/09/2019 FINDINGS: The cardiac silhouette, mediastinal and hilar contours are stable. Stable tortuosity of the thoracic aorta. There are new patchy bibasilar infiltrates. No pleural effusions or pulmonary lesions. The bony thorax is intact. IMPRESSION: Patchy bibasilar infiltrates. Electronically Signed   By: Marijo Sanes M.D.   On: 05/19/2019 16:41   Dg Lumbar Spine 2-3 Views  Result Date: 05/19/2019 CLINICAL DATA:  Low back pain status post recent fall. EXAM: LUMBAR SPINE - 2-3 VIEW COMPARISON:  06/26/2010 FINDINGS: There is no acute osseous abnormality. Multilevel advanced degenerative changes are noted throughout the visualized portions of the thoracolumbar spine. Facet arthrosis is noted at the lower lumbar segments. Vascular calcifications are noted. Multiple surgical clips project over the patient's abdomen. IMPRESSION: 1. No acute osseous abnormality. 2. Advanced multilevel degenerative changes are noted throughout the thoracolumbar spine. Electronically Signed   By: Constance Holster M.D.   On: 05/19/2019 19:12   Dg Hip Unilat With Pelvis 2-3 Views Right  Result Date: 05/19/2019 CLINICAL DATA:  Low back and hip pain after falling EXAM: DG HIP (WITH OR WITHOUT PELVIS) 2-3V RIGHT COMPARISON:  04/29/2019 FINDINGS: Osseous demineralization. Degenerative changes of the hip joints bilaterally with joint space narrowing and spur formation greater on RIGHT. SI joints preserved. No definite fracture, dislocation or bone destruction. A lucency traversing the subcapital region of the RIGHT femoral neck on the frog-leg lateral view is unchanged since the previous exam and suspect is related to superimposition of the acetabulum. IMPRESSION: No acute osseous abnormalities. Osseous demineralization with degenerative changes of the hip joints bilaterally greater on RIGHT. Electronically Signed   By: Lavonia Dana M.D.   On: 05/19/2019 19:18    Cardiac Studies   2D echo  04/27/2019: 1. Left ventricular ejection fraction, by visual estimation, is 45 to 50%. The left ventricle has mildly decreased function. There is mildly increased left ventricular hypertrophy. 2. Left ventricular diastolic parameters are indeterminate. 3. Global right ventricle has normal systolic function.The right ventricular size is normal. No increase in right ventricular wall thickness. 4. Left atrial size was moderately dilated. 5. Right atrial size was normal. 6. The mitral valve is normal in structure. No evidence of mitral valve regurgitation. No evidence of mitral stenosis. 7. The tricuspid valve is normal in structure. Tricuspid valve regurgitation is not demonstrated. 8. The aortic valve is normal in structure. Aortic  valve regurgitation is not visualized. Mild to moderate aortic valve sclerosis/calcification without any evidence of aortic stenosis. 9. The pulmonic valve was normal in structure. Pulmonic valve regurgitation is not visualized. 10. TR signal is inadequate for assessing pulmonary artery systolic pressure. 11. The inferior vena cava is normal in size with greater than 50% respiratory variability, suggesting right atrial pressure of 3 mmHg. __________  RHC 03/29/2019: Pulmonary capillary wedge pressure mean of 8 PA: 32/16 mean 23 RA mean of 10 RV 26/12/13   Cardiac output 8.17 Cardiac index 3.42  Final Conclusions:  Normal right heart pressures  Recommendations:  Etiology of his shortness of breath likely multifactorial He is at euvolemic state at this time, no aggressive diuresis needed Needs referral to pulmonary for COPD work-up, long smoking history He may benefit from lung Works, exercise as needed sedentary lifestyle and is deconditioned Recommended weight loss, lifestyle modification given obesity contributing to his shortness of breath symptoms __________  2D echo 03/03/2019: 1. Left ventricular ejection fraction, by visual estimation, is  55 to 60%. The left ventricle has normal function. Normal left ventricular size. Left ventricular septal wall thickness was mildly increased. Mildly increased left ventricular posterior  wall thickness. There is mildly increased left ventricular hypertrophy. 2. Elevated left ventricular end-diastolic pressure. 3. Left ventricular diastolic Doppler parameters are consistent with pseudonormalization pattern of LV diastolic filling. 4. Global right ventricle has normal systolic function.The right ventricular size is normal. No increase in right ventricular wall thickness. 5. Left atrial size was severely dilated. 6. Right atrial size was normal. 7. The mitral valve is normal in structure. Trace mitral valve regurgitation. No evidence of mitral stenosis. 8. The tricuspid valve is normal in structure. Tricuspid valve regurgitation is trivial. 9. The aortic valve is normal in structure. Aortic valve regurgitation was not visualized by color flow Doppler. Mild aortic valve sclerosis without stenosis. 10. The pulmonic valve was normal in structure. Pulmonic valve regurgitation is trivial by color flow Doppler. 11. Moderately elevated pulmonary artery systolic pressure. 12. The inferior vena cava is dilated in size with >50% respiratory variability, suggesting right atrial pressure of 8 mmHg. ___________  Nuclear stress test 03/02/2019:  This is a high risk study due to reduced systolic function. There is no ischemia.  There was no ST segment deviation noted during stress.  The left ventricular ejection fraction is severely decreased (<30%). However the veracity of these findings is unclear given poor tracer uptake.  Resting images are poor due to reduced tracer uptake.  Recommend echo for systolic function assessment.  Patient Profile     63 y.o. 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  Assessment & Plan    Chest pain and shortness of breath without elevated troponin --Repeated admissions due to shortness of breath and chest pain, likely multifactorial in etiology as outlined in HPI and including possible short segment Barrett's as seen on previous EGD/GI workup. Previous echo as above with normal EF.  [place] 2011 showed nonobstructive CAD.  Lexiscan 02/2019 without evidence of ischemia. --EKG without acute changes from previous.  High-sensitivity troponin peaked at 19 and not consistent with ACS.   --Consider GI etiology of CP. Cannot completely exclude underlying cardiac etiology; however, not ideal candidate for cath given underlying CKD and recurrent bleeding issues. No plan for LHC this admission. Continue medical management with LHC as last resort for refractory sx. No need for heparin. Continue PTA  Imdur, PRN SL nitro, and BB. Continue PPI. Daily BMET.   Chronic diastolic CHF with pulmonary HTN --Patient reports continued SOB, likely multifactorial given COPD, anemia, obesity/OHS, poorly controlled BP, and AOCKD with 1 kidney. BNP is not terribly elevated. Previous PE workup as above negative. Previous GI workup as above without signs of active bleed. Previous RHC as above with nl right heart pressures. Known history of poor compliance with patient reporting he has been recently compliant today. --Continue increased dose oral lasix 80mg  BID with PTA lasix 80mg  daily. Still volume overloaded on exam with no improvement in weight and worsening LEE, compared with that of yesterday's  exam.  --Continue to monitor I/O, daily weights. Weight 121.3 kg  121kg with previous clinic weight 121 kg and last hospital weight 119 kg. Output recorded differently throughout chart with net negative either -52.2cc or -1552.22cc for admission. Cutoff for ReDS vest is BMI 38 with patient 37.30; therefore, vest results may or may not prove accurate.  --Continue daily BMET with Cr 1.82  2.08 and BUN 50  50 in the setting of continued volume overload. Co2 improvement from 23  18.   HTN --Continue medical management / PTA medications amlodipine, Imdur, lasix, hydralazine.  Morbid obesity with sleep apnea  --CPAP compliance stressed.  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 Baseline 1.6-2.0 with Cr 2.56 at presentation and currently 2.08. Continue to closely monitor.  Hypokalemia, resolved --K 4.2 with goal 4.0. Mg 2.1.  Iron deficiency anemia with recent hemoptysis --As detailed above, extensive work-up for hemoptysis unrevealing.  Hgb stable from previous. Continue to monitor with daily CBC.  Patient reports no further melena or hemoptysis.  Consider chronic anemia is playing a role in his symptoms.  Insulin-dependent diabetes --Hyperglycemic at presentation. Could contribute to sx. SSI, per IM.   For questions or updates, please contact [place] Please consult [email or web address] for contact info under        Signed, Arvil Chaco, PA-C  05/21/2019, 2:05 PM

## 2019-05-22 DIAGNOSIS — E1122 Type 2 diabetes mellitus with diabetic chronic kidney disease: Secondary | ICD-10-CM

## 2019-05-22 DIAGNOSIS — I5032 Chronic diastolic (congestive) heart failure: Secondary | ICD-10-CM

## 2019-05-22 DIAGNOSIS — E876 Hypokalemia: Secondary | ICD-10-CM

## 2019-05-22 DIAGNOSIS — J181 Lobar pneumonia, unspecified organism: Secondary | ICD-10-CM

## 2019-05-22 DIAGNOSIS — I5033 Acute on chronic diastolic (congestive) heart failure: Secondary | ICD-10-CM

## 2019-05-22 DIAGNOSIS — E8770 Fluid overload, unspecified: Secondary | ICD-10-CM

## 2019-05-22 DIAGNOSIS — E119 Type 2 diabetes mellitus without complications: Secondary | ICD-10-CM

## 2019-05-22 LAB — BASIC METABOLIC PANEL
Anion gap: 12 (ref 5–15)
BUN: 56 mg/dL — ABNORMAL HIGH (ref 8–23)
CO2: 17 mmol/L — ABNORMAL LOW (ref 22–32)
Calcium: 9 mg/dL (ref 8.9–10.3)
Chloride: 105 mmol/L (ref 98–111)
Creatinine, Ser: 2.06 mg/dL — ABNORMAL HIGH (ref 0.61–1.24)
GFR calc Af Amer: 38 mL/min — ABNORMAL LOW (ref >60)
GFR calc non Af Amer: 33 mL/min — ABNORMAL LOW (ref >60)
Glucose, Bld: 297 mg/dL — ABNORMAL HIGH (ref 70–99)
Potassium: 4 mmol/L (ref 3.5–5.1)
Sodium: 134 mmol/L — ABNORMAL LOW (ref 135–145)

## 2019-05-22 LAB — CULTURE, BAL-QUANTITATIVE W GRAM STAIN: Culture: 5000 — AB

## 2019-05-22 LAB — GLUCOSE, CAPILLARY
Glucose-Capillary: 304 mg/dL — ABNORMAL HIGH (ref 70–99)
Glucose-Capillary: 337 mg/dL — ABNORMAL HIGH (ref 70–99)
Glucose-Capillary: 238 mg/dL — ABNORMAL HIGH (ref 70–99)
Glucose-Capillary: 259 mg/dL — ABNORMAL HIGH (ref 70–99)

## 2019-05-22 MED ORDER — CEFDINIR 300 MG PO CAPS
300.0000 mg | ORAL_CAPSULE | Freq: Two times a day (BID) | ORAL | Status: AC
  Administered 2019-05-22 – 2019-05-24 (×4): 300 mg via ORAL
  Filled 2019-05-22 (×4): qty 1

## 2019-05-22 MED ORDER — AZITHROMYCIN 250 MG PO TABS
500.0000 mg | ORAL_TABLET | Freq: Every day | ORAL | Status: AC
  Administered 2019-05-22 – 2019-05-23 (×2): 500 mg via ORAL
  Filled 2019-05-22 (×2): qty 2

## 2019-05-22 MED ORDER — INSULIN ASPART 100 UNIT/ML ~~LOC~~ SOLN
6.0000 [IU] | Freq: Three times a day (TID) | SUBCUTANEOUS | Status: DC
  Administered 2019-05-22 – 2019-05-24 (×6): 6 [IU] via SUBCUTANEOUS
  Filled 2019-05-22 (×6): qty 1

## 2019-05-22 MED ORDER — INSULIN GLARGINE 100 UNIT/ML ~~LOC~~ SOLN
26.0000 [IU] | Freq: Every day | SUBCUTANEOUS | Status: DC
  Administered 2019-05-22 – 2019-05-23 (×2): 26 [IU] via SUBCUTANEOUS
  Filled 2019-05-22 (×3): qty 0.26

## 2019-05-22 MED ORDER — METHYLPREDNISOLONE SODIUM SUCC 125 MG IJ SOLR
60.0000 mg | Freq: Every day | INTRAMUSCULAR | Status: DC
  Administered 2019-05-23 – 2019-05-24 (×2): 60 mg via INTRAVENOUS
  Filled 2019-05-22 (×2): qty 2

## 2019-05-22 MED ORDER — IPRATROPIUM-ALBUTEROL 0.5-2.5 (3) MG/3ML IN SOLN
3.0000 mL | Freq: Three times a day (TID) | RESPIRATORY_TRACT | Status: DC
  Administered 2019-05-22 – 2019-05-24 (×6): 3 mL via RESPIRATORY_TRACT
  Filled 2019-05-22 (×6): qty 3

## 2019-05-22 MED ORDER — CETIRIZINE HCL 10 MG PO TABS
10.0000 mg | ORAL_TABLET | Freq: Every day | ORAL | Status: DC
  Administered 2019-05-22 – 2019-05-24 (×3): 10 mg via ORAL
  Filled 2019-05-22 (×3): qty 1

## 2019-05-22 NOTE — Care Management Important Message (Signed)
Important Message  Patient Details  Name: Larry Callahan MRN: [id number] Date of Birth: [birth year]   Medicare Important Message Given:  Yes     Dannette Barbara 05/22/2019, 11:17 AM

## 2019-05-22 NOTE — Progress Notes (Signed)
Progress Note  Patient Name: Larry Callahan Date of Encounter: 05/22/2019  Primary Cardiologist: [name], MD   Subjective   Patient seen this morning, states feeling okay denies shortness of breath or chest pain.  Net -3.2 L over the past 24 hours.  Inpatient Medications    Scheduled Meds: . amLODipine  10 mg Oral Daily  . aspirin  81 mg Oral Daily  . enoxaparin (LOVENOX) injection  40 mg Subcutaneous Q24H  . ferrous gluconate  324 mg Oral Q breakfast  . furosemide  80 mg Oral BID  . gemfibrozil  600 mg Oral BID WC  . glimepiride  2 mg Oral QAC breakfast  . guaiFENesin  600 mg Oral BID  . hydrALAZINE  100 mg Oral TID  . insulin aspart  0-15 Units Subcutaneous TID WC  . insulin aspart  10 Units Subcutaneous Once  . insulin aspart  6 Units Subcutaneous TID WC  . insulin glargine  26 Units Subcutaneous QHS  . ipratropium-albuterol  3 mL Nebulization QID  . isosorbide mononitrate  30 mg Oral BID  . loratadine  10 mg Oral Daily  . methylPREDNISolone (SOLU-MEDROL) injection  60 mg Intravenous Q8H  . metoprolol succinate  50 mg Oral QHS  . pantoprazole  40 mg Oral Daily  . potassium chloride  20 mEq Oral BID  . tiotropium  18 mcg Inhalation Daily   Continuous Infusions: . sodium chloride 250 mL (05/20/19 2229)  . azithromycin 500 mg (05/21/19 2103)  . cefTRIAXone (ROCEPHIN)  IV 2 g (05/21/19 2104)   PRN Meds: sodium chloride, acetaminophen **OR** acetaminophen, albuterol, chlorpheniramine-HYDROcodone, magnesium hydroxide, morphine injection, nitroGLYCERIN, ondansetron **OR** ondansetron (ZOFRAN) IV, traZODone   Vital Signs    Vitals:   05/21/19 2004 05/22/19 0345 05/22/19 0728 05/22/19 0741  BP:  (!) 133/54 (!) 150/66 132/65  Pulse:  80 63 66  Resp:  20 19 18   Temp:  98.4 F (36.9 C) (!) 97.5 F (36.4 C) 97.7 F (36.5 C)  TempSrc:  Oral Oral Oral  SpO2: 95% 97% 97% 94%  Weight:  124.2 kg    Height:        Intake/Output Summary (Last 24 hours) at  05/22/2019 1032 Last data filed at 05/22/2019 0348 Gross per 24 hour  Intake 120 ml  Output 3000 ml  Net -2880 ml   Last 3 Weights 05/22/2019 05/20/2019 05/19/2019  Weight (lbs) 273 lb 14.4 oz 266 lb 11.2 oz 267 lb 6.7 oz  Weight (kg) 124.24 kg 120.974 kg 121.3 kg  Some encounter information is confidential and restricted. Go to Review Flowsheets activity to see all data.      Telemetry    Sinus rhythm- Personally Reviewed  ECG    Not obtained today  Physical Exam   GEN: No acute distress.   Neck:  Some JVD/HJR Cardiac: RRR, no murmurs, rubs, or gallops.  Respiratory:  Poor inspiratory effort, crackles at bases. GI: Soft, nontender, non-distended  MS:  Trace edema; No deformity. Neuro:  Nonfocal  Psych: Normal affect   Labs    High Sensitivity Troponin:   Recent Labs  Lab 05/09/19 0911 05/19/19 1522 05/19/19 1748 05/20/19 0929 05/21/19 0452  TROPONINIHS 15 18* 19* 16 11      Chemistry Recent Labs  Lab 05/20/19 0311 05/20/19 0929 05/21/19 0851 05/22/19 0850  NA 139  --  133* 134*  K 2.9* 3.4* 4.2 4.0  CL 107  --  103 105  CO2 23  --  18*  17*  GLUCOSE 98  --  290* 297*  BUN 50*  --  50* 56*  CREATININE 1.82*  --  2.08* 2.06*  CALCIUM 8.6*  --  8.8* 9.0  GFRNONAA 38*  --  32* 33*  GFRAA 44*  --  38* 38*  ANIONGAP 9  --  12 12     Hematology Recent Labs  Lab 05/18/19 0736 05/19/19 1522 05/20/19 0311  WBC 7.0 8.0 7.6  RBC 3.75* 4.01* 3.70*  HGB 10.3* 11.1* 10.2*  HCT 33.0* 35.2* 31.1*  MCV 88.0 87.8 84.1  MCH 27.5 27.7 27.6  MCHC 31.2 31.5 32.8  RDW 16.3* 16.2* 16.1*  PLT 207 210 189    BNP Recent Labs  Lab 05/19/19 1522  BNP 143.0*     DDimer No results for input(s): DDIMER in the last 168 hours.   Radiology    No results found.  Cardiac Studies   TTE 04/27/2019 1. Left ventricular ejection fraction, by visual estimation, is 45 to 50%. The left ventricle has mildly decreased function. There is mildly increased left ventricular  hypertrophy.  2. Left ventricular diastolic parameters are indeterminate.  3. Global right ventricle has normal systolic function.The right ventricular size is normal. No increase in right ventricular wall thickness.  4. Left atrial size was moderately dilated.  5. Right atrial size was normal.  6. The mitral valve is normal in structure. No evidence of mitral valve regurgitation. No evidence of mitral stenosis.  7. The tricuspid valve is normal in structure. Tricuspid valve regurgitation is not demonstrated.  8. The aortic valve is normal in structure. Aortic valve regurgitation is not visualized. Mild to moderate aortic valve sclerosis/calcification without any evidence of aortic stenosis.  9. The pulmonic valve was normal in structure. Pulmonic valve regurgitation is not visualized. 10. TR signal is inadequate for assessing pulmonary artery systolic pressure. 11. The inferior vena cava is normal in size with greater than 50% respiratory variability, suggesting right atrial pressure of 3 mmHg.  Patient Profile     63 y.o. male history of COPD, CKD stage III, OSA on CPAP, diabetes, obesity, heart failure preserved ejection fraction presenting cough and productive sputum.  Found to have infiltrates on chest x-ray, being treated for pneumonia.  Patient is being seen for fluid overload and chest pain.  Assessment & Plan    Patient is net -3.2 L over the past 24 hours.  Creatinine is stable stable at 2.08 day.  1. Edema -Net -3.2 L over the past 24 hours Continue Lasix at current dose of 80 mg p.o. twice daily -Minimize steroid use as much as possible as this can be causing some fluid retention.  2.  History of nonobstructive CAD. -Currently chest pain-free -Continue antianginals Toprol-XL 50 daily, Imdur 30, amlodipine.  3.  CKD stage III -Creatinine stable -Monitor creatinine in light of diuresing.  4.  Pneumonia -Treatment as per primary team   Likely discharge in a day or 2 if he  continues diuresing and stays symptom-free.  Signed, Kate Sable, MD  05/22/2019, 10:32 AM

## 2019-05-22 NOTE — Progress Notes (Signed)
Progress Note  Patient Name: Larry Callahan Date of Encounter: 05/22/2019  Primary Cardiologist: [name], MD   Subjective   No chest pain.  No racing heart rate or palpitations.  Notes improved shortness of breath and volume status.  Eager to go home and take care of his cat.  Inpatient Medications    Scheduled Meds: . amLODipine  10 mg Oral Daily  . aspirin  81 mg Oral Daily  . enoxaparin (LOVENOX) injection  40 mg Subcutaneous Q24H  . ferrous gluconate  324 mg Oral Q breakfast  . furosemide  80 mg Oral BID  . gemfibrozil  600 mg Oral BID WC  . glimepiride  2 mg Oral QAC breakfast  . guaiFENesin  600 mg Oral BID  . hydrALAZINE  100 mg Oral TID  . insulin aspart  0-15 Units Subcutaneous TID WC  . insulin aspart  10 Units Subcutaneous Once  . insulin aspart  6 Units Subcutaneous TID WC  . insulin glargine  26 Units Subcutaneous QHS  . ipratropium-albuterol  3 mL Nebulization QID  . isosorbide mononitrate  30 mg Oral BID  . loratadine  10 mg Oral Daily  . methylPREDNISolone (SOLU-MEDROL) injection  60 mg Intravenous Q8H  . metoprolol succinate  50 mg Oral QHS  . pantoprazole  40 mg Oral Daily  . potassium chloride  20 mEq Oral BID  . tiotropium  18 mcg Inhalation Daily   Continuous Infusions: . sodium chloride 250 mL (05/20/19 2229)  . azithromycin 500 mg (05/21/19 2103)  . cefTRIAXone (ROCEPHIN)  IV 2 g (05/21/19 2104)   PRN Meds: sodium chloride, acetaminophen **OR** acetaminophen, albuterol, chlorpheniramine-HYDROcodone, magnesium hydroxide, morphine injection, nitroGLYCERIN, ondansetron **OR** ondansetron (ZOFRAN) IV, traZODone   Vital Signs    Vitals:   05/21/19 2004 05/22/19 0345 05/22/19 0728 05/22/19 0741  BP:  (!) 133/54 (!) 150/66 132/65  Pulse:  80 63 66  Resp:  20 19 18   Temp:  98.4 F (36.9 C) (!) 97.5 F (36.4 C) 97.7 F (36.5 C)  TempSrc:  Oral Oral Oral  SpO2: 95% 97% 97% 94%  Weight:  124.2 kg    Height:         Intake/Output Summary (Last 24 hours) at 05/22/2019 1035 Last data filed at 05/22/2019 0348 Gross per 24 hour  Intake 120 ml  Output 3000 ml  Net -2880 ml   Last 3 Weights 05/22/2019 05/20/2019 05/19/2019  Weight (lbs) 273 lb 14.4 oz 266 lb 11.2 oz 267 lb 6.7 oz  Weight (kg) 124.24 kg 120.974 kg 121.3 kg  Some encounter information is confidential and restricted. Go to Review Flowsheets activity to see all data.      Telemetry    NSR, 70-80s - Personally Reviewed  ECG    No new tracings- Personally Reviewed  Physical Exam   GEN: Obese male. No acute distress.  Eating breakfast. Neck: JVP ~9-10cm.  Cardiac:  Distant heart sounds, RRR, no murmurs, rubs, or gallops.  Respiratory: Clear to auscultation bilaterally. No wheezing. GI: Soft, nontender, non-distended  MS:  Moderate bilateral LEE, improved from yesterday; No deformity. Neuro:  Nonfocal  Psych: Normal affect   Labs    High Sensitivity Troponin:   Recent Labs  Lab 05/09/19 0911 05/19/19 1522 05/19/19 1748 05/20/19 0929 05/21/19 0452  TROPONINIHS 15 18* 19* 16 11      Cardiac EnzymesNo results for input(s): TROPONINI in the last 168 hours. No results for input(s): TROPIPOC in the last 168 hours.  Chemistry Recent Labs  Lab 05/20/19 0311 05/20/19 0929 05/21/19 0851 05/22/19 0850  NA 139  --  133* 134*  K 2.9* 3.4* 4.2 4.0  CL 107  --  103 105  CO2 23  --  18* 17*  GLUCOSE 98  --  290* 297*  BUN 50*  --  50* 56*  CREATININE 1.82*  --  2.08* 2.06*  CALCIUM 8.6*  --  8.8* 9.0  GFRNONAA 38*  --  32* 33*  GFRAA 44*  --  38* 38*  ANIONGAP 9  --  12 12     Hematology Recent Labs  Lab 05/18/19 0736 05/19/19 1522 05/20/19 0311  WBC 7.0 8.0 7.6  RBC 3.75* 4.01* 3.70*  HGB 10.3* 11.1* 10.2*  HCT 33.0* 35.2* 31.1*  MCV 88.0 87.8 84.1  MCH 27.5 27.7 27.6  MCHC 31.2 31.5 32.8  RDW 16.3* 16.2* 16.1*  PLT 207 210 189    BNP Recent Labs  Lab 05/19/19 1522  BNP 143.0*     DDimer No results for  input(s): DDIMER in the last 168 hours.   Radiology    No results found.  Cardiac Studies   2D echo 04/27/2019: 1. Left ventricular ejection fraction, by visual estimation, is 45 to 50%. The left ventricle has mildly decreased function. There is mildly increased left ventricular hypertrophy. 2. Left ventricular diastolic parameters are indeterminate. 3. Global right ventricle has normal systolic function.The right ventricular size is normal. No increase in right ventricular wall thickness. 4. Left atrial size was moderately dilated. 5. Right atrial size was normal. 6. The mitral valve is normal in structure. No evidence of mitral valve regurgitation. No evidence of mitral stenosis. 7. The tricuspid valve is normal in structure. Tricuspid valve regurgitation is not demonstrated. 8. The aortic valve is normal in structure. Aortic valve regurgitation is not visualized. Mild to moderate aortic valve sclerosis/calcification without any evidence of aortic stenosis. 9. The pulmonic valve was normal in structure. Pulmonic valve regurgitation is not visualized. 10. TR signal is inadequate for assessing pulmonary artery systolic pressure. 11. The inferior vena cava is normal in size with greater than 50% respiratory variability, suggesting right atrial pressure of 3 mmHg. __________  RHC 03/29/2019: Pulmonary capillary wedge pressure mean of 8 PA: 32/16 mean 23 RA mean of 10 RV 26/12/13   Cardiac output 8.17 Cardiac index 3.42  Final Conclusions:  Normal right heart pressures  Recommendations:  Etiology of his shortness of breath likely multifactorial He is at euvolemic state at this time, no aggressive diuresis needed Needs referral to pulmonary for COPD work-up, long smoking history He may benefit from lung Works, exercise as needed sedentary lifestyle and is deconditioned Recommended weight loss, lifestyle modification given obesity contributing to his shortness of breath  symptoms __________  2D echo 03/03/2019: 1. Left ventricular ejection fraction, by visual estimation, is 55 to 60%. The left ventricle has normal function. Normal left ventricular size. Left ventricular septal wall thickness was mildly increased. Mildly increased left ventricular posterior  wall thickness. There is mildly increased left ventricular hypertrophy. 2. Elevated left ventricular end-diastolic pressure. 3. Left ventricular diastolic Doppler parameters are consistent with pseudonormalization pattern of LV diastolic filling. 4. Global right ventricle has normal systolic function.The right ventricular size is normal. No increase in right ventricular wall thickness. 5. Left atrial size was severely dilated. 6. Right atrial size was normal. 7. The mitral valve is normal in structure. Trace mitral valve regurgitation. No evidence of mitral stenosis. 8. The tricuspid  valve is normal in structure. Tricuspid valve regurgitation is trivial. 9. The aortic valve is normal in structure. Aortic valve regurgitation was not visualized by color flow Doppler. Mild aortic valve sclerosis without stenosis. 10. The pulmonic valve was normal in structure. Pulmonic valve regurgitation is trivial by color flow Doppler. 11. Moderately elevated pulmonary artery systolic pressure. 12. The inferior vena cava is dilated in size with >50% respiratory variability, suggesting right atrial pressure of 8 mmHg. ___________  Nuclear stress test 03/02/2019:  This is a high risk study due to reduced systolic function. There is no ischemia.  There was no ST segment deviation noted during stress.  The left ventricular ejection fraction is severely decreased (<30%). However the veracity of these findings is unclear given poor tracer uptake.  Resting images are poor due to reduced tracer uptake.  Recommend echo for systolic function assessment.  Patient Profile     63 y.o. 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  Assessment & Plan    Chest pain and shortness of breath without elevated troponin --Repeated admissions due to shortness of breath and chest pain, likely multifactorial in etiology as outlined in HPI and including GI. Previous echo as above with normal EF.  [place] 2011 showed nonobstructive CAD.  Lexiscan 02/2019 without evidence of ischemia. --EKG without acute changes from previous.  High-sensitivity troponin peaked at 19 and not consistent with ACS.   --Cannot completely exclude underlying cardiac etiology; however, not ideal candidate for cath given underlying CKD and recurrent bleeding issues. No plan for [facility] this admission. Continue medical management with LHC as last resort for refractory sx. No need for heparin. Continue PTA  Imdur, PRN SL nitro, and BB. Continue PPI. Daily BMET.   Chronic diastolic CHF with pulmonary HTN --Patient reports  improved SOB. Etiology likely multifactorial given COPD, anemia, obesity/OHS, poorly controlled BP, and AOCKD with 1 kidney. Previous PE workup as above negative. Previous GI workup as above without signs of active bleed. Previous RHC as above with nl right heart pressures.  --Known history of poor compliance with patient reporting he has been recently compliant today.  Poor understanding of comorbid conditions.  Patient would benefit from CHF re-education, provided at length today. Recommend repeat education at follow-up. --Continue increased dose oral lasix  80mg  BID, given successful output over the last day with this increase. Still volume overloaded on exam but improved when compared with that of yesterday's exam and significant output noted with increased dose. Renal function stable with slight bump in BUN as patient likely approaches his baseline. CO2 improving. Of note, cutoff for ReDS vest is BMI 38 with patient 37.30; therefore, vest results may or may not prove accurate.  --Continue to monitor I/O, daily weights. Weight 121.3 kg  121kg  124kg and with consideration that 12/8 weight likely not accurate given the volume overload noted between 12/7 and 12/8 with no change documented in weights. Previous clinic weight 121 kg & last hospital weight 119 kg; therefore, together with today's exam, suspect he is still holding on to some volume. Output -3.2L  since increase of lasix.  --At discharge, recommend reassessment of PTA diuretic with K supplementation.  Long discussion today regarding diuretic compliance, as patient notes that he dislikes taking the diuretic given it makes him urinate.  Given patient's poor compliance, would recommend euvolemic on exam before discharge and at least an additional stay to prevent readmission / monitor renal function.  HTN --Continue medical management / PTA medications amlodipine, Imdur, lasix, hydralazine.  Morbid obesity with sleep apnea  --CPAP compliance stressed.  Acute on chronic kidney disease with solitary kidney --Continue to closely monitor.  Iron deficiency anemia with recent hemoptysis --Etensive work-up for hemoptysis unrevealing.  Daily CBC.  Patient reports no further melena or hemoptysis.  Consider chronic anemia is playing a role in his symptoms.  Insulin-dependent diabetes --Hyperglycemic at presentation. Could contribute to sx. SSI, per IM.   For questions or updates, please contact [facility] Please consult [email or web address] for contact info under        Signed, Arvil Chaco,  PA-C  05/22/2019, 10:35 AM

## 2019-05-22 NOTE — Progress Notes (Signed)
Patient ID: Larry Callahan, male   DOB: [birth year], 63 y.o.   MRN: [id number] [facility] PROGRESS NOTE  Dyan Creelman Lyerly WHQ:[id number] DOB: 17-May-1956 DOA: 05/19/2019 PCP: [name], NP  HPI/Subjective: Patient was feeling better and wanted to go home.  States he is breathing better.  No coughing up blood or shortness of breath.  Objective: Vitals:   05/22/19 0741 05/22/19 1349  BP: 132/65   Pulse: 66   Resp: 18   Temp: 97.7 F (36.5 C)   SpO2: 94% 98%    Intake/Output Summary (Last 24 hours) at 05/22/2019 1614 Last data filed at 05/22/2019 1419 Gross per 24 hour  Intake 120 ml  Output 3375 ml  Net -3255 ml   Filed Weights   05/19/19 1519 05/20/19 1405 05/22/19 0345  Weight: 121.3 kg 121 kg 124.2 kg    ROS: Review of Systems  Constitutional: Negative for chills and fever.  Eyes: Negative for blurred vision.  Respiratory: Negative for cough and shortness of breath.   Cardiovascular: Negative for chest pain.  Gastrointestinal: Negative for abdominal pain, constipation, diarrhea, nausea and vomiting.  Genitourinary: Negative for dysuria.  Musculoskeletal: Negative for joint pain.  Neurological: Negative for dizziness and headaches.   Exam: Physical Exam  Constitutional: He is oriented to person, place, and time.  HENT:  Nose: No mucosal edema.  Mouth/Throat: No oropharyngeal exudate or posterior oropharyngeal edema.  Eyes: Pupils are equal, round, and reactive to light. Conjunctivae, EOM and lids are normal.  Neck: No JVD present. Carotid bruit is not present. No edema present. No thyroid mass and no thyromegaly present.  Cardiovascular: S1 normal and S2 normal. Exam reveals no gallop.  No murmur heard. Pulses:      Dorsalis pedis pulses are 2+ on the right side and 2+ on the left side.  Respiratory: No respiratory distress. He has decreased breath sounds in the right lower field and the left lower field. He has no wheezes. He has no  rhonchi. He has no rales.  GI: Soft. Bowel sounds are normal. There is no abdominal tenderness.  Musculoskeletal:     Right ankle: He exhibits swelling.     Left ankle: He exhibits swelling.  Lymphadenopathy:    He has no cervical adenopathy.  Neurological: He is alert and oriented to person, place, and time. No cranial nerve deficit.  Skin: Skin is warm. Nails show no clubbing.  Chronic lower extremity skin discoloration  Psychiatric: He has a normal mood and affect.      Data Reviewed: Basic Metabolic Panel: Recent Labs  Lab 05/17/19 0830 05/18/19 0736 05/19/19 1522 05/20/19 0311 05/20/19 0929 05/21/19 0851 05/22/19 0850  NA  --  135 139 139  --  133* 134*  K  --  3.4* 3.0* 2.9* 3.4* 4.2 4.0  CL  --  104 106 107  --  103 105  CO2  --  _0 --  18* 17*  GLUCOSE  --  161* 204* 98  --  290* 297*  BUN  --  34* 55* 50*  --  50* 56*  CREATININE  --  1.73* 2.56* 1.82*  --  2.08* 2.06*  CALCIUM  --  8.4* 8.9 8.6*  --  8.8* 9.0  MG 2.0  --   --  2.0 2.0 2.1  --    Liver Function Tests: No results for input(s): AST, ALT, ALKPHOS, BILITOT, PROT, ALBUMIN in the last 168 hours. No results for input(s): LIPASE, AMYLASE in  the last 168 hours. No results for input(s): AMMONIA in the last 168 hours. CBC: Recent Labs  Lab 05/16/19 0630 05/17/19 0729 05/18/19 0736 05/19/19 1522 05/20/19 0311  WBC 8.5 7.3 7.0 8.0 7.6  NEUTROABS  --  4.9 4.6  --   --   HGB 11.2* 10.5* 10.3* 11.1* 10.2*  HCT 34.4* 32.4* 33.0* 35.2* 31.1*  MCV 84.1 85.0 88.0 87.8 84.1  PLT 231 214 207 210 189   Cardiac Enzymes: No results for input(s): CKTOTAL, CKMB, CKMBINDEX, TROPONINI in the last 168 hours. BNP (last 3 results) Recent Labs    04/24/19 1122 05/10/19 1640 05/19/19 1522  BNP 88.0 75.0 143.0*    ProBNP (last 3 results) No results for input(s): PROBNP in the last 8760 hours.  CBG: Recent Labs  Lab 05/21/19 1131 05/21/19 1725 05/21/19 2106 05/22/19 0739 05/22/19 1131  GLUCAP  239* 236* 367* 304* 337*    Recent Results (from the past 240 hour(s))  SARS CORONAVIRUS 2 (TAT 6-24 HRS) Nasopharyngeal Nasopharyngeal Swab     Status: None   Collection Time: 05/16/19  4:02 AM   Specimen: Nasopharyngeal Swab  Result Value Ref Range Status   SARS Coronavirus 2 NEGATIVE NEGATIVE Final    Comment: (NOTE) SARS-CoV-2 target nucleic acids are NOT DETECTED. The SARS-CoV-2 RNA is generally detectable in upper and lower respiratory specimens during the acute phase of infection. Negative results do not preclude SARS-CoV-2 infection, do not rule out co-infections with other pathogens, and should not be used as the sole basis for treatment or other patient management decisions. Negative results must be combined with clinical observations, patient history, and epidemiological information. The expected result is Negative. Fact Sheet for Patients: SugarRoll.be Fact Sheet for Healthcare Providers: https://www.woods-mathews.com/ This test is not yet approved or cleared by the [place] FDA and  has been authorized for detection and/or diagnosis of SARS-CoV-2 by FDA under an Emergency Use Authorization (EUA). This EUA will remain  in effect (meaning this test can be used) for the duration of the COVID-19 declaration under Section 56 4(b)(1) of the Act, 21 U.S.C. section 360bbb-3(b)(1), unless the authorization is terminated or revoked sooner. Performed at [facility], [place] [address], [place], [place] [address]   Culture, bal-quantitative     Status: Abnormal   Collection Time: 05/17/19  4:16 PM   Specimen: Bronchoalveolar Lavage; Respiratory  Result Value Ref Range Status   Specimen Description   Final    BRONCHIAL ALVEOLAR LAVAGE Performed at [facility], [address] [place], [place] [address]    Special Requests   Final    NONE Performed at [facility], [address] [place], [place]  [address]    Gram Stain   Final    FEW WBC PRESENT, PREDOMINANTLY MONONUCLEAR NO ORGANISMS SEEN    Culture (A)  Final    5,000 COLONIES/mL Consistent with normal respiratory flora. Performed at [facility], [place] [address] [place], [place] [address]    Report Status 05/22/2019 FINAL  Final  Acid Fast Smear (AFB)     Status: None   Collection Time: 05/17/19  4:16 PM   Specimen: Bronchoalveolar Lavage; Respiratory  Result Value Ref Range Status   AFB Specimen Processing Concentration  Final   Acid Fast Smear Negative  Final    Comment: (NOTE) Performed At: [facility] [place], [place] [id number] Rush Farmer MD OV:[id number]    Source (AFB) BRONCHIAL ALVEOLAR LAVAGE  Final  Comment: Performed at [facility], [address] [place], [place] [address]  Culture, fungus without smear     Status: None (Preliminary result)   Collection Time: 05/17/19  4:16 PM   Specimen: Bronchoalveolar Lavage; Other  Result Value Ref Range Status   Specimen Description   Final    BRONCHIAL ALVEOLAR LAVAGE Performed at [facility], [address] [place], [place] [address]    Special Requests   Final    NONE Performed at [facility], [address], [place], [place] [address]    Culture   Final    NO FUNGUS ISOLATED AFTER 5 DAYS Performed at [facility], [place] [address] [place], [place] [address]    Report Status PENDING  Incomplete  SARS CORONAVIRUS 2 (TAT 6-24 HRS) Nasopharyngeal Nasopharyngeal Swab     Status: None   Collection Time: 05/19/19  8:21 PM   Specimen: Nasopharyngeal Swab  Result Value Ref Range Status   SARS Coronavirus 2 NEGATIVE NEGATIVE Final    Comment: (NOTE) SARS-CoV-2 target nucleic acids are NOT DETECTED. The SARS-CoV-2 RNA is generally detectable in upper and lower respiratory specimens during the acute phase of infection. Negative results do not preclude SARS-CoV-2 infection, do not rule  out co-infections with other pathogens, and should not be used as the sole basis for treatment or other patient management decisions. Negative results must be combined with clinical observations, patient history, and epidemiological information. The expected result is Negative. Fact Sheet for Patients: SugarRoll.be Fact Sheet for Healthcare Providers: https://www.woods-mathews.com/ This test is not yet approved or cleared by the [place] FDA and  has been authorized for detection and/or diagnosis of SARS-CoV-2 by FDA under an Emergency Use Authorization (EUA). This EUA will remain  in effect (meaning this test can be used) for the duration of the COVID-19 declaration under Section 56 4(b)(1) of the Act, 21 U.S.C. section 360bbb-3(b)(1), unless the authorization is terminated or revoked sooner. Performed at [facility], [place] [address], [place], [place] [address]       Scheduled Meds: . amLODipine  10 mg Oral Daily  . aspirin  81 mg Oral Daily  . azithromycin  500 mg Oral Daily  . cefdinir  300 mg Oral Q12H  . cetirizine  10 mg Oral Daily  . enoxaparin (LOVENOX) injection  40 mg Subcutaneous Q24H  . ferrous gluconate  324 mg Oral Q breakfast  . furosemide  80 mg Oral BID  . gemfibrozil  600 mg Oral BID WC  . glimepiride  2 mg Oral QAC breakfast  . guaiFENesin  600 mg Oral BID  . hydrALAZINE  100 mg Oral TID  . insulin aspart  0-15 Units Subcutaneous TID WC  . insulin aspart  10 Units Subcutaneous Once  . insulin aspart  6 Units Subcutaneous TID WC  . insulin glargine  26 Units Subcutaneous QHS  . ipratropium-albuterol  3 mL Nebulization TID  . isosorbide mononitrate  30 mg Oral BID  . [START ON 05/23/2019] methylPREDNISolone (SOLU-MEDROL) injection  60 mg Intravenous Daily  . metoprolol succinate  50 mg Oral QHS  . pantoprazole  40 mg Oral Daily  . potassium chloride  20 mEq Oral BID  . tiotropium  18 mcg Inhalation  Daily   Continuous Infusions: . sodium chloride 250 mL (05/20/19 2229)    Assessment/Plan:  1. Acute on chronic diastolic congestive heart failure with pulmonary hypertension.  Cardiology wanted to continue aggressive diuresis. 2. Lobar pneumonia switch  antibiotics over to oral 3. COPD exacerbation.  Drop steroid dose down to daily dosing instead of 3 times a day dosing 4. 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  Monitor with diuresis.  Continue Lantus and low-dose glimepiride and sliding scale insulin. 5. Hypokalemia and hypomagnesemia.  Status post replacement 6. Essential hypertension.  Continue usual medications  Code Status:     Code Status Orders  (From admission, onward)         Start     Ordered   05/19/19 2129  Full code  Continuous     05/19/19 2136        Code Status History    Date Active Date Inactive Code Status Order ID Comments User Context   05/16/2019 0118 05/18/2019 1503 Full Code [id number]  Orene Desanctis, DO ED   05/09/2019 1352 05/13/2019 2257 Full Code [id number]  Damita Lack, MD ED   04/24/2019 2038 04/30/2019 1406 Full Code [id number]  Caren Griffins, MD Inpatient   04/10/2019 1958 04/13/2019 1718 Full Code [id number]  Loletha Grayer, MD ED   03/26/2019 1532 03/31/2019 1723 Full Code [id number]  Loletha Grayer, MD ED   03/01/2019 1536 03/05/2019 2055 Full Code [id number]  Loletha Grayer, MD ED   Advance Care Planning Activity      Disposition Plan: Evaluate on a daily basis on when to go home  Consultants:  Cardiology  Antibiotics:  P.o. Ceftin and Zithromax  Time spent: 28 minutes  [place]

## 2019-05-22 NOTE — Progress Notes (Signed)
Inpatient Diabetes Program Recommendations  AACE/ADA: New Consensus Statement on Inpatient Glycemic Control (2015)  Target Ranges:  Prepandial:   less than 140 mg/dL      Peak postprandial:   less than 180 mg/dL (1-2 hours)      Critically ill patients:  140 - 180 mg/dL   Lab Results  Component Value Date   GLUCAP 304 (H) 05/22/2019   HGBA1C 7.0 (H) 05/20/2019    Review of Glycemic Control Results for Larry Callahan, Larry Callahan (MRN [id number]) as of 05/22/2019 09:51  Ref. Range 05/21/2019 07:46 05/21/2019 11:31 05/21/2019 17:25 05/21/2019 21:06 05/22/2019 07:39  Glucose-Capillary Latest Ref Range: 70 - 99 mg/dL 303 (H) 239 (H) 236 (H) 367 (H) 304 (H)   Admit with: Pneumonia/ COPD Flare/ CP rule out ACS  History: DM, CHF, CKD  Home DM Meds: Toujeo 26 units QAM                             Amaryl 2 mg Daily  Current Orders: Lantus 15 units QHS                            Novolog Moderate Correction Scale/ SSI (0-15                                  units) TID AC                            Amaryl 2 mg Daily  Inpatient Diabetes Program Recommendations:   -Increase Lantus to 26 units daily -Novolog 6 units tid meal coverage if eats 50%  Thank you, Bethena Roys E. Tanish Sinkler, RN, MSN, CDE  Diabetes Coordinator Inpatient Glycemic Control Team Team Pager [phone] (8am-5pm) 05/22/2019 9:51 AM

## 2019-05-22 NOTE — Progress Notes (Signed)
Patient refusing bed alarm at this time.  Educated on safety and reason for bed alarm to be in place.  Observed patient and appears steady on feet.

## 2019-05-23 ENCOUNTER — Telehealth: Payer: Self-pay | Admitting: *Deleted

## 2019-05-23 DIAGNOSIS — N1832 Chronic kidney disease, stage 3b: Secondary | ICD-10-CM

## 2019-05-23 DIAGNOSIS — E1122 Type 2 diabetes mellitus with diabetic chronic kidney disease: Secondary | ICD-10-CM

## 2019-05-23 DIAGNOSIS — N184 Chronic kidney disease, stage 4 (severe): Secondary | ICD-10-CM

## 2019-05-23 DIAGNOSIS — R531 Weakness: Secondary | ICD-10-CM

## 2019-05-23 DIAGNOSIS — J441 Chronic obstructive pulmonary disease with (acute) exacerbation: Secondary | ICD-10-CM

## 2019-05-23 DIAGNOSIS — E1121 Type 2 diabetes mellitus with diabetic nephropathy: Secondary | ICD-10-CM

## 2019-05-23 DIAGNOSIS — J181 Lobar pneumonia, unspecified organism: Principal | ICD-10-CM

## 2019-05-23 DIAGNOSIS — Z794 Long term (current) use of insulin: Secondary | ICD-10-CM

## 2019-05-23 LAB — GLUCOSE, CAPILLARY
Glucose-Capillary: 275 mg/dL — ABNORMAL HIGH (ref 70–99)
Glucose-Capillary: 327 mg/dL — ABNORMAL HIGH (ref 70–99)
Glucose-Capillary: 152 mg/dL — ABNORMAL HIGH (ref 70–99)
Glucose-Capillary: 213 mg/dL — ABNORMAL HIGH (ref 70–99)

## 2019-05-23 LAB — BASIC METABOLIC PANEL
Anion gap: 11 (ref 5–15)
BUN: 62 mg/dL — ABNORMAL HIGH (ref 8–23)
CO2: 21 mmol/L — ABNORMAL LOW (ref 22–32)
Calcium: 9.1 mg/dL (ref 8.9–10.3)
Chloride: 105 mmol/L (ref 98–111)
Creatinine, Ser: 2.11 mg/dL — ABNORMAL HIGH (ref 0.61–1.24)
GFR calc Af Amer: 37 mL/min — ABNORMAL LOW (ref >60)
GFR calc non Af Amer: 32 mL/min — ABNORMAL LOW (ref >60)
Glucose, Bld: 285 mg/dL — ABNORMAL HIGH (ref 70–99)
Potassium: 3.8 mmol/L (ref 3.5–5.1)
Sodium: 137 mmol/L (ref 135–145)

## 2019-05-23 MED ORDER — OXYCODONE HCL 5 MG PO TABS
5.0000 mg | ORAL_TABLET | ORAL | Status: DC | PRN
  Administered 2019-05-23 (×2): 5 mg via ORAL
  Filled 2019-05-23 (×2): qty 1

## 2019-05-23 NOTE — Progress Notes (Signed)
Progress Note  Patient Name: Larry Callahan Date of Encounter: 05/23/2019  Primary Cardiologist: [name], MD   Subjective   Feels at his baseline, some right flank discomfort on palpation Discussed recent hospital admissions for nosebleed, hemoptysis Discussed work-up results with him including normal bronchoscopy, normal ENT work-up He does have esophagitis, gastritis, needs to stay on PPI --Prior cardiac work-up discussed with him including right heart catheterization, stress testing -Reports compliance with his Lasix 80 twice daily --Discussed his oxygen with him, he has generator at home also backup tank in case the power goes out.  For some reason is not using it in the daytime only with CPAP despite having shortness of breath episodes -Reports compliance with his inhalers  Inpatient Medications    Scheduled Meds: . amLODipine  10 mg Oral Daily  . aspirin  81 mg Oral Daily  . azithromycin  500 mg Oral Daily  . cefdinir  300 mg Oral Q12H  . cetirizine  10 mg Oral Daily  . enoxaparin (LOVENOX) injection  40 mg Subcutaneous Q24H  . ferrous gluconate  324 mg Oral Q breakfast  . furosemide  80 mg Oral BID  . gemfibrozil  600 mg Oral BID WC  . glimepiride  2 mg Oral QAC breakfast  . guaiFENesin  600 mg Oral BID  . hydrALAZINE  100 mg Oral TID  . insulin aspart  0-15 Units Subcutaneous TID WC  . insulin aspart  6 Units Subcutaneous TID WC  . insulin glargine  26 Units Subcutaneous QHS  . ipratropium-albuterol  3 mL Nebulization TID  . isosorbide mononitrate  30 mg Oral BID  . methylPREDNISolone (SOLU-MEDROL) injection  60 mg Intravenous Daily  . metoprolol succinate  50 mg Oral QHS  . pantoprazole  40 mg Oral Daily  . potassium chloride  20 mEq Oral BID  . tiotropium  18 mcg Inhalation Daily   Continuous Infusions: . sodium chloride 250 mL (05/20/19 2229)   PRN Meds: sodium chloride, acetaminophen **OR** acetaminophen, albuterol,  chlorpheniramine-HYDROcodone, magnesium hydroxide, morphine injection, nitroGLYCERIN, ondansetron **OR** ondansetron (ZOFRAN) IV, traZODone   Vital Signs    Vitals:   05/23/19 0130 05/23/19 0523 05/23/19 0728 05/23/19 0822  BP: 137/64 (!) 142/80 131/73   Pulse: 80 66 (!) 55   Resp:   18   Temp:  97.8 F (36.6 C) (!) 97.4 F (36.3 C)   TempSrc:  Oral Oral   SpO2: 96% 95% 97% 97%  Weight:  122.6 kg    Height:        Intake/Output Summary (Last 24 hours) at 05/23/2019 1156 Last data filed at 05/23/2019 0957 Gross per 24 hour  Intake 480 ml  Output 4500 ml  Net -4020 ml   Last 3 Weights 05/23/2019 05/22/2019 05/20/2019  Weight (lbs) 270 lb 4.8 oz 273 lb 14.4 oz 266 lb 11.2 oz  Weight (kg) 122.607 kg 124.24 kg 120.974 kg  Some encounter information is confidential and restricted. Go to Review Flowsheets activity to see all data.      Telemetry    NSR, 70-80s - Personally Reviewed  ECG    No new tracings- Personally Reviewed  Physical Exam   Constitutional:  oriented to person, place, and time. No distress.  HENT:  Head: Grossly normal Eyes:  no discharge. No scleral icterus.  Neck: No JVD, no carotid bruits  Cardiovascular: Regular rate and rhythm, no murmurs appreciated Pulmonary/Chest: Moderately decreased breath sounds throughout with wheezes Abdominal: Soft.  no distension.  no  tenderness.  Musculoskeletal: Normal range of motion Neurological:  normal muscle tone. Coordination normal. No atrophy Skin: Skin warm and dry Psychiatric: normal affect, pleasant   Labs    High Sensitivity Troponin:   Recent Labs  Lab 05/09/19 0911 05/19/19 1522 05/19/19 1748 05/20/19 0929 05/21/19 0452  TROPONINIHS 15 18* 19* 16 11      Cardiac EnzymesNo results for input(s): TROPONINI in the last 168 hours. No results for input(s): TROPIPOC in the last 168 hours.   Chemistry Recent Labs  Lab 05/21/19 0851 05/22/19 0850 05/23/19 0526  NA 133* 134* 137  K 4.2 4.0 3.8   CL 103 105 105  CO2 18* 17* 21*  GLUCOSE 290* 297* 285*  BUN 50* 56* 62*  CREATININE 2.08* 2.06* 2.11*  CALCIUM 8.8* 9.0 9.1  GFRNONAA 32* 33* 32*  GFRAA 38* 38* 37*  ANIONGAP 12 12 11      Hematology Recent Labs  Lab 05/18/19 0736 05/19/19 1522 05/20/19 0311  WBC 7.0 8.0 7.6  RBC 3.75* 4.01* 3.70*  HGB 10.3* 11.1* 10.2*  HCT 33.0* 35.2* 31.1*  MCV 88.0 87.8 84.1  MCH 27.5 27.7 27.6  MCHC 31.2 31.5 32.8  RDW 16.3* 16.2* 16.1*  PLT 207 210 189    BNP Recent Labs  Lab 05/19/19 1522  BNP 143.0*     DDimer No results for input(s): DDIMER in the last 168 hours.   Radiology    No results found.  Cardiac Studies   2D echo 04/27/2019: 1. Left ventricular ejection fraction, by visual estimation, is 45 to 50%. The left ventricle has mildly decreased function. There is mildly increased left ventricular hypertrophy. 2. Left ventricular diastolic parameters are indeterminate. 3. Global right ventricle has normal systolic function.The right ventricular size is normal. No increase in right ventricular wall thickness. 4. Left atrial size was moderately dilated. 5. Right atrial size was normal. 6. The mitral valve is normal in structure. No evidence of mitral valve regurgitation. No evidence of mitral stenosis. 7. The tricuspid valve is normal in structure. Tricuspid valve regurgitation is not demonstrated. 8. The aortic valve is normal in structure. Aortic valve regurgitation is not visualized. Mild to moderate aortic valve sclerosis/calcification without any evidence of aortic stenosis. 9. The pulmonic valve was normal in structure. Pulmonic valve regurgitation is not visualized. 10. TR signal is inadequate for assessing pulmonary artery systolic pressure. 11. The inferior vena cava is normal in size with greater than 50% respiratory variability, suggesting right atrial pressure of 3 mmHg. __________  RHC 03/29/2019: Pulmonary capillary wedge pressure mean of 8  PA: 32/16 mean 23 RA mean of 10 RV 26/12/13   Cardiac output 8.17 Cardiac index 3.42  Final Conclusions:  Normal right heart pressures  Recommendations:  Etiology of his shortness of breath likely multifactorial He is at euvolemic state at this time, no aggressive diuresis needed Needs referral to pulmonary for COPD work-up, long smoking history He may benefit from lung Works, exercise as needed sedentary lifestyle and is deconditioned Recommended weight loss, lifestyle modification given obesity contributing to his shortness of breath symptoms __________  2D echo 03/03/2019: 1. Left ventricular ejection fraction, by visual estimation, is 55 to 60%. The left ventricle has normal function. Normal left ventricular size. Left ventricular septal wall thickness was mildly increased. Mildly increased left ventricular posterior  wall thickness. There is mildly increased left ventricular hypertrophy. 2. Elevated left ventricular end-diastolic pressure. 3. Left ventricular diastolic Doppler parameters are consistent with pseudonormalization pattern of LV diastolic filling. 4.  Global right ventricle has normal systolic function.The right ventricular size is normal. No increase in right ventricular wall thickness. 5. Left atrial size was severely dilated. 6. Right atrial size was normal. 7. The mitral valve is normal in structure. Trace mitral valve regurgitation. No evidence of mitral stenosis. 8. The tricuspid valve is normal in structure. Tricuspid valve regurgitation is trivial. 9. The aortic valve is normal in structure. Aortic valve regurgitation was not visualized by color flow Doppler. Mild aortic valve sclerosis without stenosis. 10. The pulmonic valve was normal in structure. Pulmonic valve regurgitation is trivial by color flow Doppler. 11. Moderately elevated pulmonary artery systolic pressure. 12. The inferior vena cava is dilated in size with >50% respiratory variability,  suggesting right atrial pressure of 8 mmHg. ___________  Nuclear stress test 03/02/2019:  This is a high risk study due to reduced systolic function. There is no ischemia.  There was no ST segment deviation noted during stress.  The left ventricular ejection fraction is severely decreased (<30%). However the veracity of these findings is unclear given poor tracer uptake.  Resting images are poor due to reduced tracer uptake.  Recommend echo for systolic function assessment.  Patient Profile     63 y.o. 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  Assessment & Plan    Respiratory distress Was told he might have pneumonia based off an x-ray, denies any sputum production Feels at his baseline --- He has had diuresis here with worsening renal dysfunction on IV Lasix Prior right heart catheterization with low pressures,.  Shortness of breath symptoms not from heart failure, but secondary to underlying COPD.  Stop smoking only several months ago -No further cardiac work-up needed --- Strongly recommended he use nasal cannula oxygen at home, he already has a generator.  Needs some long tubing -Suggested when he is short of breath that he unhook it from his CPAP and use the oxygen Reports compliance with his inhalers -Stressed the importance of weight loss, better diet  Chronic diastolic CHF --- He has had aggressive diuresis here with worsening renal dysfunction consistent with prerenal state  --- No evidence of significant heart failure this admission --- He is being over diuresed in an effort to improve his shortness of breath which is predominantly secondary to COPD. No evidence of significant pulmonary hypertension on right heart catheterization He has been placed back on his Lasix 80 twice daily  HTN  amlodipine, Imdur, lasix, hydralazine.  Morbid obesity with sleep apnea  --CPAP compliance stressed Uses CPAP with oxygen --- Recommended a walking program, weight loss, dietary changes -He would benefit from referral to dietary given he is diabetic.  Acute on chronic kidney disease with solitary kidney Worsening renal failure in the setting of overdiuresis Placed back on Lasix 80 twice daily Narrow therapeutic window given he is not using his oxygen for COPD on a regular basis and symptomatic --Stressed importance of using his home oxygen  Iron deficiency anemia with recent hemoptysis --Etensive work-up for hemoptysis unrevealing. Aspirin stopped  Insulin-dependent diabetes -Poor diet, unclear if he is  compliant with medications  Long discussion with him concerning prior hospital admissions, COPD/explained this is a chronic sometimes terminal disease, discussed chronic bronchitis, hemoptysis, discussed prior testing and results  Total encounter time more than 45 minutes  Greater than 50% was spent in counseling and coordination of care with the patient   For questions or updates, please contact [place] Please consult [email or web address] for contact info under        Signed, [name], MD  05/23/2019, 11:56 AM

## 2019-05-23 NOTE — Telephone Encounter (Signed)
The patient is currently admitted. To scheduling for appt & to triage for follow up call post discharge.

## 2019-05-23 NOTE — Progress Notes (Signed)
Patient ID: Larry Callahan, male   DOB: [birth year], 63 y.o.   MRN: [id number] [facility] PROGRESS NOTE  Damarien Nyman Estrella JJK:[id number] DOB: 1955/09/19 DOA: 05/19/2019 PCP: [name], NP  HPI/Subjective: Today now he complains of chest pain on the right side especially when taking a deep breath.  He complains of wheezing shortness of breath and cough.  Patient states that last night he did have a small nosebleed in his left nostril.  Objective: Vitals:   05/23/19 0728 05/23/19 0822  BP: 131/73   Pulse: (!) 55   Resp: 18   Temp: (!) 97.4 F (36.3 C)   SpO2: 97% 97%    Intake/Output Summary (Last 24 hours) at 05/23/2019 1408 Last data filed at 05/23/2019 1300 Gross per 24 hour  Intake 720 ml  Output 4500 ml  Net -3780 ml   Filed Weights   05/20/19 1405 05/22/19 0345 05/23/19 0523  Weight: 121 kg 124.2 kg 122.6 kg    ROS: Review of Systems  Constitutional: Negative for chills and fever.  Eyes: Negative for blurred vision.  Respiratory: Positive for cough, shortness of breath and wheezing.   Cardiovascular: Positive for chest pain.  Gastrointestinal: Negative for abdominal pain, constipation, diarrhea, nausea and vomiting.  Genitourinary: Negative for dysuria.  Musculoskeletal: Negative for joint pain.  Neurological: Negative for dizziness and headaches.   Exam: Physical Exam  Constitutional: He is oriented to person, place, and time.  HENT:  Nose: No mucosal edema.  Mouth/Throat: No oropharyngeal exudate or posterior oropharyngeal edema.  Eyes: Pupils are equal, round, and reactive to light. Conjunctivae, EOM and lids are normal.  Neck: No JVD present. Carotid bruit is not present. No thyroid mass and no thyromegaly present.  Cardiovascular: S1 normal and S2 normal. Exam reveals no gallop.  No murmur heard. Pulses:      Dorsalis pedis pulses are 2+ on the right side and 2+ on the left side.  Respiratory: No respiratory distress. He has  decreased breath sounds in the right lower field and the left lower field. He has no wheezes. He has no rhonchi. He has no rales.  GI: Soft. Bowel sounds are normal. There is no abdominal tenderness.  Musculoskeletal:     Cervical back: No edema.     Right ankle: Swelling present.     Left ankle: Swelling present.  Lymphadenopathy:    He has no cervical adenopathy.  Neurological: He is alert and oriented to person, place, and time. No cranial nerve deficit.  Skin: Skin is warm. Nails show no clubbing.  Chronic lower extremity skin discoloration  Psychiatric: He has a normal mood and affect.      Data Reviewed: Basic Metabolic Panel: Recent Labs  Lab 05/17/19 0830 05/19/19 1522 05/20/19 0311 05/20/19 0929 05/21/19 0851 05/22/19 0850 05/23/19 0526  NA  --  139 139  --  133* 134* 137  K  --  3.0* 2.9* 3.4* 4.2 4.0 3.8  CL  --  106 107  --  103 105 105  CO2  --  22 23  --  18* 17* 21*  GLUCOSE  --  204* 98  --  290* 297* 285*  BUN  --  55* 50*  --  50* 56* 62*  CREATININE  --  2.56* 1.82*  --  2.08* 2.06* 2.11*  CALCIUM  --  8.9 8.6*  --  8.8* 9.0 9.1  MG 2.0  --  2.0 2.0 2.1  --   --  CBC: Recent Labs  Lab 05/17/19 0729 05/18/19 0736 05/19/19 1522 05/20/19 0311  WBC 7.3 7.0 8.0 7.6  NEUTROABS 4.9 4.6  --   --   HGB 10.5* 10.3* 11.1* 10.2*  HCT 32.4* 33.0* 35.2* 31.1*  MCV 85.0 88.0 87.8 84.1  PLT 214 207 210 189   BNP (last 3 results) Recent Labs    04/24/19 1122 05/10/19 1640 05/19/19 1522  BNP 88.0 75.0 143.0*    CBG: Recent Labs  Lab 05/22/19 1131 05/22/19 1644 05/22/19 2120 05/23/19 0729 05/23/19 1112  GLUCAP 337* 238* 259* 275* 327*    Recent Results (from the past 240 hour(s))  SARS CORONAVIRUS 2 (TAT 6-24 HRS) Nasopharyngeal Nasopharyngeal Swab     Status: None   Collection Time: 05/16/19  4:02 AM   Specimen: Nasopharyngeal Swab  Result Value Ref Range Status   SARS Coronavirus 2 NEGATIVE NEGATIVE Final    Comment: (NOTE) SARS-CoV-2  target nucleic acids are NOT DETECTED. The SARS-CoV-2 RNA is generally detectable in upper and lower respiratory specimens during the acute phase of infection. Negative results do not preclude SARS-CoV-2 infection, do not rule out co-infections with other pathogens, and should not be used as the sole basis for treatment or other patient management decisions. Negative results must be combined with clinical observations, patient history, and epidemiological information. The expected result is Negative. Fact Sheet for Patients: SugarRoll.be Fact Sheet for Healthcare Providers: https://www.woods-mathews.com/ This test is not yet approved or cleared by the [place] FDA and  has been authorized for detection and/or diagnosis of SARS-CoV-2 by FDA under an Emergency Use Authorization (EUA). This EUA will remain  in effect (meaning this test can be used) for the duration of the COVID-19 declaration under Section 56 4(b)(1) of the Act, 21 U.S.C. section 360bbb-3(b)(1), unless the authorization is terminated or revoked sooner. Performed at [facility], [place] [address] [place], [place] [address]   Culture, bal-quantitative     Status: Abnormal   Collection Time: 05/17/19  4:16 PM   Specimen: Bronchoalveolar Lavage; Respiratory  Result Value Ref Range Status   Specimen Description   Final    BRONCHIAL ALVEOLAR LAVAGE Performed at [facility], [address] [place], [place] [address]    Special Requests   Final    NONE Performed at [facility], [address] [place], [place] [address]    Gram Stain   Final    FEW WBC PRESENT, PREDOMINANTLY MONONUCLEAR NO ORGANISMS SEEN    Culture (A)  Final    5,000 COLONIES/mL Consistent with normal respiratory flora. Performed at [facility], [place] [address], [place], [place] [address]    Report Status 05/22/2019 FINAL  Final  Acid Fast Smear (AFB)     Status: None    Collection Time: 05/17/19  4:16 PM   Specimen: Bronchoalveolar Lavage; Respiratory  Result Value Ref Range Status   AFB Specimen Processing Concentration  Final   Acid Fast Smear Negative  Final    Comment: (NOTE) Performed At: [facility] [place], [place] [id number] Rush Farmer MD QJ:[id number]    Source (AFB) BRONCHIAL ALVEOLAR LAVAGE  Final    Comment: Performed at [facility], [address] [place], [place] [address]  Culture, fungus without smear     Status: None (Preliminary result)   Collection Time: 05/17/19  4:16 PM   Specimen: Bronchoalveolar Lavage; Other  Result Value Ref Range Status   Specimen Description   Final  BRONCHIAL ALVEOLAR LAVAGE Performed at [facility], [address], [place], [place] [address]    Special Requests   Final    NONE Performed at [facility], [address], [place], [place] [address]    Culture   Final    NO FUNGUS ISOLATED AFTER 6 DAYS Performed at [facility], [place] [address] [place], [place] [address]    Report Status PENDING  Incomplete  SARS CORONAVIRUS 2 (TAT 6-24 HRS) Nasopharyngeal Nasopharyngeal Swab     Status: None   Collection Time: 05/19/19  8:21 PM   Specimen: Nasopharyngeal Swab  Result Value Ref Range Status   SARS Coronavirus 2 NEGATIVE NEGATIVE Final    Comment: (NOTE) SARS-CoV-2 target nucleic acids are NOT DETECTED. The SARS-CoV-2 RNA is generally detectable in upper and lower respiratory specimens during the acute phase of infection. Negative results do not preclude SARS-CoV-2 infection, do not rule out co-infections with other pathogens, and should not be used as the sole basis for treatment or other patient management decisions. Negative results must be combined with clinical observations, patient history, and epidemiological information. The expected result is Negative. Fact Sheet for  Patients: SugarRoll.be Fact Sheet for Healthcare Providers: https://www.woods-mathews.com/ This test is not yet approved or cleared by the [place] FDA and  has been authorized for detection and/or diagnosis of SARS-CoV-2 by FDA under an Emergency Use Authorization (EUA). This EUA will remain  in effect (meaning this test can be used) for the duration of the COVID-19 declaration under Section 56 4(b)(1) of the Act, 21 U.S.C. section 360bbb-3(b)(1), unless the authorization is terminated or revoked sooner. Performed at [facility], [place] [address] [place], [place] [address]       Scheduled Meds: . amLODipine  10 mg Oral Daily  . aspirin  81 mg Oral Daily  . azithromycin  500 mg Oral Daily  . cefdinir  300 mg Oral Q12H  . cetirizine  10 mg Oral Daily  . enoxaparin (LOVENOX) injection  40 mg Subcutaneous Q24H  . ferrous gluconate  324 mg Oral Q breakfast  . furosemide  80 mg Oral BID  . gemfibrozil  600 mg Oral BID WC  . glimepiride  2 mg Oral QAC breakfast  . guaiFENesin  600 mg Oral BID  . hydrALAZINE  100 mg Oral TID  . insulin aspart  0-15 Units Subcutaneous TID WC  . insulin aspart  6 Units Subcutaneous TID WC  . insulin glargine  26 Units Subcutaneous QHS  . ipratropium-albuterol  3 mL Nebulization TID  . isosorbide mononitrate  30 mg Oral BID  . methylPREDNISolone (SOLU-MEDROL) injection  60 mg Intravenous Daily  . metoprolol succinate  50 mg Oral QHS  . pantoprazole  40 mg Oral Daily  . potassium chloride  20 mEq Oral BID  . tiotropium  18 mcg Inhalation Daily   Continuous Infusions: . sodium chloride 250 mL (05/20/19 2229)    Assessment/Plan:  1. Right-sided pleuritic chest pain with COPD exacerbation and lobar pneumonia.  Continue Solu-Medrol.  Pain control with oxycodone.  I told him that I think that this is pleuritic chest pain and will get better as we treat pneumonia and inflammation.   2. Acute on chronic  diastolic congestive heart failure with pulmonary hypertension.  Cardiology switch Lasix over to oral 80 mg twice daily. 3. Lobar pneumonia switch antibiotics over to oral Omnicef and Zithromax 4. COPD exacerbation.  Drop steroid dose down to daily dosing instead of  3 times a day dosing 5. 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  Monitor with diuresis.  Continue Lantus and low-dose glimepiride and sliding scale insulin. 6. Hypokalemia and hypomagnesemia.  Replaced during hospital course. 7. Essential hypertension.  Continue usual medications  Code Status:     Code Status Orders  (From admission, onward)         Start     Ordered   05/19/19 2129  Full code  Continuous     05/19/19 2136        Code Status History    Date Active Date Inactive Code Status Order ID Comments User Context   05/16/2019 0118 05/18/2019 1503 Full Code [id number]  Orene Desanctis, DO ED   05/09/2019 1352 05/13/2019 2257 Full Code [id number]  Damita Lack, MD ED   04/24/2019 2038 04/30/2019 1406 Full Code [id number]  Caren Griffins, MD Inpatient   04/10/2019 1958 04/13/2019 1718 Full Code [id number]  Loletha Grayer, MD ED   03/26/2019 1532 03/31/2019 1723 Full Code [id number]  Loletha Grayer, MD ED   03/01/2019 1536 03/05/2019 2055 Full Code [id number]  Loletha Grayer, MD ED   Advance Care Planning Activity      Disposition Plan: Patient developed right-sided chest pain and is nervous to go home.  Reevaluated tomorrow morning.  Consultants:  Cardiology  Antibiotics:  P.o. Omnicef and Zithromax  Time spent: 27 minutes.  Case discussed with cardiology and the patient's sister on the phone.  [place]  [facility] [employer]

## 2019-05-23 NOTE — Telephone Encounter (Signed)
-----   Message from [name], MD sent at 05/23/2019  1:08 PM EST ----- Tcm need, D/c Thursday [place], APP F/u appt needed 1-2 wks

## 2019-05-23 NOTE — Progress Notes (Signed)
RN ambulated patient on room air around the nurses station x3. Pt displayed very little shortness of breath and oxygen saturation remained >94%. Pt tolerated ambulating well and did not require any supplemental oxygen. I will continue to assess.

## 2019-05-24 ENCOUNTER — Telehealth: Payer: Self-pay

## 2019-05-24 DIAGNOSIS — E1122 Type 2 diabetes mellitus with diabetic chronic kidney disease: Secondary | ICD-10-CM

## 2019-05-24 DIAGNOSIS — R0781 Pleurodynia: Secondary | ICD-10-CM

## 2019-05-24 DIAGNOSIS — N183 Chronic kidney disease, stage 3 unspecified: Secondary | ICD-10-CM

## 2019-05-24 LAB — GLUCOSE, CAPILLARY: Glucose-Capillary: 129 mg/dL — ABNORMAL HIGH (ref 70–99)

## 2019-05-24 MED ORDER — NITROGLYCERIN 0.4 MG SL SUBL: 0.4000 mg | SUBLINGUAL_TABLET | SUBLINGUAL | 0 refills | Status: DC | PRN

## 2019-05-24 MED ORDER — POTASSIUM CHLORIDE CRYS ER 20 MEQ PO TBCR: 20.0000 meq | EXTENDED_RELEASE_TABLET | Freq: Every day | ORAL | 0 refills | Status: DC

## 2019-05-24 MED ORDER — FUROSEMIDE 80 MG PO TABS: 80.0000 mg | ORAL_TABLET | Freq: Two times a day (BID) | ORAL | 0 refills | Status: DC

## 2019-05-24 MED ORDER — CEFDINIR 300 MG PO CAPS: 300.0000 mg | ORAL_CAPSULE | Freq: Two times a day (BID) | ORAL | 0 refills | Status: DC

## 2019-05-24 MED ORDER — SPIRIVA HANDIHALER 18 MCG IN CAPS: 18.0000 ug | ORAL_CAPSULE | Freq: Every day | RESPIRATORY_TRACT | 0 refills | Status: DC

## 2019-05-24 NOTE — Care Management Important Message (Signed)
Important Message  Patient Details  Name: Larry Callahan MRN: [id number] Date of Birth: 08/25/1955   Medicare Important Message Given:  Yes     Dannette Barbara 05/24/2019, 11:33 AM

## 2019-05-24 NOTE — Telephone Encounter (Signed)
LMOM FOR PATIENT TO SCREEN AND CONFIRM 05-28-19 OV.

## 2019-05-24 NOTE — Telephone Encounter (Signed)
The patient is still currently admitted. Will need a TCM call Monday if discharged today/ over the weekend.

## 2019-05-24 NOTE — Plan of Care (Signed)
  Problem: Clinical Measurements: Goal: Respiratory complications will improve Outcome: Progressing Goal: Cardiovascular complication will be avoided Outcome: Completed/Met   Problem: Activity: Goal: Risk for activity intolerance will decrease Outcome: Completed/Met   Problem: Safety: Goal: Ability to remain free from injury will improve Outcome: Completed/Met

## 2019-05-24 NOTE — Telephone Encounter (Signed)
TCM....  Patient is being discharged      They are scheduled to see Sharolyn Douglas 05/31/19 at 1055   They were seen for Chest Pain   They need to be seen within 7-10 days

## 2019-05-24 NOTE — Progress Notes (Signed)
Progress Note  Patient Name: Larry Callahan Date of Encounter: 05/24/2019  Primary Cardiologist: [name], MD   Subjective   No significant complaints today He has had various complaints this admission from weakness in the legs, numbness, abdominal issues, pain in his right flank, chronic shortness of breath  Inpatient Medications    Scheduled Meds: . amLODipine  10 mg Oral Daily  . aspirin  81 mg Oral Daily  . azithromycin  500 mg Oral Daily  . cefdinir  300 mg Oral Q12H  . cetirizine  10 mg Oral Daily  . enoxaparin (LOVENOX) injection  40 mg Subcutaneous Q24H  . ferrous gluconate  324 mg Oral Q breakfast  . furosemide  80 mg Oral BID  . gemfibrozil  600 mg Oral BID WC  . glimepiride  2 mg Oral QAC breakfast  . guaiFENesin  600 mg Oral BID  . hydrALAZINE  100 mg Oral TID  . insulin aspart  0-15 Units Subcutaneous TID WC  . insulin aspart  6 Units Subcutaneous TID WC  . insulin glargine  26 Units Subcutaneous QHS  . ipratropium-albuterol  3 mL Nebulization TID  . isosorbide mononitrate  30 mg Oral BID  . methylPREDNISolone (SOLU-MEDROL) injection  60 mg Intravenous Daily  . metoprolol succinate  50 mg Oral QHS  . pantoprazole  40 mg Oral Daily  . potassium chloride  20 mEq Oral BID  . tiotropium  18 mcg Inhalation Daily   Continuous Infusions: . sodium chloride 250 mL (05/20/19 2229)   PRN Meds: sodium chloride, acetaminophen **OR** acetaminophen, albuterol, chlorpheniramine-HYDROcodone, magnesium hydroxide, morphine injection, nitroGLYCERIN, ondansetron **OR** ondansetron (ZOFRAN) IV, traZODone    Vital Signs    Vitals:   05/23/19 2035 05/24/19 0547 05/24/19 0844 05/24/19 1126  BP: 132/67 139/70 130/72 138/78  Pulse: 81 60 60 70  Resp: 20 16 19 19   Temp: 97.6 F (36.4 C) (!) 97.4 F (36.3 C)  (!) 97.5 F (36.4 C)  TempSrc: Oral Oral  Oral  SpO2: 92% 96% 96% 97%  Weight:  121.2 kg    Height:        Intake/Output Summary (Last 24 hours)  at 05/24/2019 1537 Last data filed at 05/24/2019 0900 Gross per 24 hour  Intake 480 ml  Output 1750 ml  Net -1270 ml   Last 3 Weights 05/24/2019 05/23/2019 05/22/2019  Weight (lbs) 267 lb 4.8 oz 270 lb 4.8 oz 273 lb 14.4 oz  Weight (kg) 121.246 kg 122.607 kg 124.24 kg  Some encounter information is confidential and restricted. Go to Review Flowsheets activity to see all data.      Telemetry    NSR,  - Personally Reviewed  ECG    No new tracings- Personally Reviewed  Physical Exam   Constitutional:  oriented to person, place, and time. No distress.  Obese HENT:  Head: Grossly normal Eyes:  no discharge. No scleral icterus.  Neck: No JVD, no carotid bruits  Cardiovascular: Regular rate and rhythm, no murmurs appreciated Pulmonary/Chest: Clear to auscultation bilaterally, no wheezes or rales, scattered wheeze Abdominal: Soft.  no distension.  no tenderness.  Musculoskeletal: Normal range of motion Neurological:  normal muscle tone. Coordination normal. No atrophy Skin: Skin warm and dry Psychiatric: normal affect, pleasant  Labs    High Sensitivity Troponin:   Recent Labs  Lab 05/09/19 0911 05/19/19 1522 05/19/19 1748 05/20/19 0929 05/21/19 0452  TROPONINIHS 15 18* 19* 16 11      Cardiac EnzymesNo results for input(s): TROPONINI  in the last 168 hours. No results for input(s): TROPIPOC in the last 168 hours.   Chemistry Recent Labs  Lab 05/21/19 0851 05/22/19 0850 05/23/19 0526  NA 133* 134* 137  K 4.2 4.0 3.8  CL 103 105 105  CO2 18* 17* 21*  GLUCOSE 290* 297* 285*  BUN 50* 56* 62*  CREATININE 2.08* 2.06* 2.11*  CALCIUM 8.8* 9.0 9.1  GFRNONAA 32* 33* 32*  GFRAA 38* 38* 37*  ANIONGAP 12 12 11      Hematology Recent Labs  Lab 05/18/19 0736 05/19/19 1522 05/20/19 0311  WBC 7.0 8.0 7.6  RBC 3.75* 4.01* 3.70*  HGB 10.3* 11.1* 10.2*  HCT 33.0* 35.2* 31.1*  MCV 88.0 87.8 84.1  MCH 27.5 27.7 27.6  MCHC 31.2 31.5 32.8  RDW 16.3* 16.2* 16.1*  PLT  207 210 189    BNP Recent Labs  Lab 05/19/19 1522  BNP 143.0*     DDimer No results for input(s): DDIMER in the last 168 hours.   Radiology    No results found.  Cardiac Studies   2D echo 04/27/2019: 1. Left ventricular ejection fraction, by visual estimation, is 45 to 50%. The left ventricle has mildly decreased function. There is mildly increased left ventricular hypertrophy. 2. Left ventricular diastolic parameters are indeterminate. 3. Global right ventricle has normal systolic function.The right ventricular size is normal. No increase in right ventricular wall thickness. 4. Left atrial size was moderately dilated. 5. Right atrial size was normal. 6. The mitral valve is normal in structure. No evidence of mitral valve regurgitation. No evidence of mitral stenosis. 7. The tricuspid valve is normal in structure. Tricuspid valve regurgitation is not demonstrated. 8. The aortic valve is normal in structure. Aortic valve regurgitation is not visualized. Mild to moderate aortic valve sclerosis/calcification without any evidence of aortic stenosis. 9. The pulmonic valve was normal in structure. Pulmonic valve regurgitation is not visualized. 10. TR signal is inadequate for assessing pulmonary artery systolic pressure. 11. The inferior vena cava is normal in size with greater than 50% respiratory variability, suggesting right atrial pressure of 3 mmHg. __________  RHC 03/29/2019: Pulmonary capillary wedge pressure mean of 8 PA: 32/16 mean 23 RA mean of 10 RV 26/12/13   Cardiac output 8.17 Cardiac index 3.42  Final Conclusions:  Normal right heart pressures  Recommendations:  Etiology of his shortness of breath likely multifactorial He is at euvolemic state at this time, no aggressive diuresis needed Needs referral to pulmonary for COPD work-up, long smoking history He may benefit from lung Works, exercise as needed sedentary lifestyle and is  deconditioned Recommended weight loss, lifestyle modification given obesity contributing to his shortness of breath symptoms __________  2D echo 03/03/2019: 1. Left ventricular ejection fraction, by visual estimation, is 55 to 60%. The left ventricle has normal function. Normal left ventricular size. Left ventricular septal wall thickness was mildly increased. Mildly increased left ventricular posterior  wall thickness. There is mildly increased left ventricular hypertrophy. 2. Elevated left ventricular end-diastolic pressure. 3. Left ventricular diastolic Doppler parameters are consistent with pseudonormalization pattern of LV diastolic filling. 4. Global right ventricle has normal systolic function.The right ventricular size is normal. No increase in right ventricular wall thickness. 5. Left atrial size was severely dilated. 6. Right atrial size was normal. 7. The mitral valve is normal in structure. Trace mitral valve regurgitation. No evidence of mitral stenosis. 8. The tricuspid valve is normal in structure. Tricuspid valve regurgitation is trivial. 9. The aortic valve is normal  in structure. Aortic valve regurgitation was not visualized by color flow Doppler. Mild aortic valve sclerosis without stenosis. 10. The pulmonic valve was normal in structure. Pulmonic valve regurgitation is trivial by color flow Doppler. 11. Moderately elevated pulmonary artery systolic pressure. 12. The inferior vena cava is dilated in size with >50% respiratory variability, suggesting right atrial pressure of 8 mmHg. ___________  Nuclear stress test 03/02/2019:  This is a high risk study due to reduced systolic function. There is no ischemia.  There was no ST segment deviation noted during stress.  The left ventricular ejection fraction is severely decreased (<30%). However the veracity of these findings is unclear given poor tracer uptake.  Resting images are poor due to reduced tracer  uptake.  Recommend echo for systolic function assessment.  Patient Profile     63 y.o. 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  Assessment & Plan    Respiratory distress Severe underlying COPD, quit smoking this past summer Shortness of breath is a chronic issue, he has had significant cardiac work-up including right heart catheterization showing low pressures with no significant heart failure, stress test with no ischemia ----No further cardiac work-up needed We have recommended he use oxygen in his house at home which he has not been using, only with CPAP --- He needs aggressive weight loss, he is very deconditioned He has underlying sense of helplessness at home, leading to frequent hospital admissions  HTN  amlodipine, Imdur, lasix, hydralazine. Well controlled pressures  Morbid obesity with sleep apnea  --CPAP compliance stressed Uses CPAP with oxygen = He is sedentary, deconditioned leading to shortness of breath and a sense of helplessness  Acute on chronic kidney disease with solitary kidney Initially treated with Lasix IV twice daily, transition to Lasix 80 p.o. twice daily -Needs aggressive diuresis and to run " dry" given his underlying morbid obesity COPD narrow therapeutic window  Iron deficiency anemia with recent hemoptysis --Etensive work-up for hemoptysis unrevealing. Aspirin stopped Symptoms have resolved  Insulin-dependent diabetes -Poor diet, Stressed  importance of compliance with his medications   Total encounter time more than 25 minutes  Greater than 50% was spent in counseling and coordination of care with the patient   For questions or updates, please contact [place] Please consult [email or web address] for contact info under        Signed, [name], MD  05/24/2019, 3:37 PM

## 2019-05-24 NOTE — Progress Notes (Signed)
IV and telemetry removed for discharge. Medication and discharge instructions reviewed pt seemed to be distracted and not listening to instructions. Nurse tried to re educate on medications and directions. Pt wheeled down to family vehicle in w/c. No distress noted.

## 2019-05-24 NOTE — Discharge Summary (Signed)
[facility] NAME: Larry Callahan    MR#:  [id number]  DATE OF BIRTH:  04-01-1956  DATE OF ADMISSION:  05/19/2019 ADMITTING PHYSICIAN: Christel Mormon, MD  DATE OF DISCHARGE: 05/24/2019 11:50 AM  PRIMARY CARE PHYSICIAN: [name], NP    ADMISSION DIAGNOSIS:  Weakness [R53.1] Acute kidney injury ([place]) [N17.9] Chest pain, unspecified type [R07.9]  DISCHARGE DIAGNOSIS:  Active Problems:   COPD with acute exacerbation ([place])   CAP (community acquired pneumonia)   Full code status   Pleuritic chest pain   Hypervolemia   Acute on chronic diastolic CHF (congestive heart failure) (HCC)   Lobar pneumonia (HCC)   Type 2 diabetes mellitus with stage 3b chronic kidney disease, with long-term current use of insulin (HCC)   Hypokalemia   Hypomagnesemia   CKD stage 3 due to type 2 diabetes mellitus ([place])   SECONDARY DIAGNOSIS:   Past Medical History:  Diagnosis Date  . Allergy   . Coronary artery disease   . Diabetes mellitus without complication ([place])   . Diastolic CHF ([place])   . GERD (gastroesophageal reflux disease)   . Hyperlipidemia   . Hypertension   . Renal insufficiency   . Sleep apnea     HOSPITAL COURSE:   1.  Acute on chronic diastolic congestive heart failure with pulmonary hypertension.  Patient was diuresed with Lasix IV during the hospital course.  Cardiology recommended continuing Lasix 80 mg orally twice a day as outpatient.  Daily weights.  Follow-up with CHF clinic.  Patient is on Toprol. 2.  Lobar pneumonia.  Patient was receiving Zithromax and Rocephin during the hospital course.  Patient completed Zithromax in the hospital.  1 more day of Omnicef prescribed upon going home. 3.  Right-sided pleuritic chest pain with COPD exacerbation.  Patient was given Solu-Medrol 5 days here in the hospital.  Upon discharge the patient had no further pleuritic chest pain.  Patient states that he has in his inhalers at  home. 4.  Type 2 diabetes mellitus with chronic kidney disease stage IIIb.  Continue to monitor creatinine as outpatient.  Patient on Toujeo insulin and glimepiride at home. 5.  Hypokalemia and hypomagnesemia.  These were replaced during the hospital course 6.  Essential hypertension.  Blood pressure stable on usual medications.  DISCHARGE CONDITIONS:   Fair  CONSULTS OBTAINED:  Treatment Team:  Wellington Hampshire, MD  DRUG ALLERGIES:   Allergies  Allergen Reactions  . Penicillins Rash    DISCHARGE MEDICATIONS:   Allergies as of 05/24/2019      Reactions   Penicillins Rash      Medication List    STOP taking these medications   clarithromycin 500 MG tablet Commonly known as: BIAXIN   clobetasol cream 0.05 % Commonly known as: TEMOVATE   ferrous sulfate 325 (65 FE) MG EC tablet   nicotine 21 mg/24hr patch Commonly known as: NICODERM CQ - dosed in mg/24 hours   predniSONE 20 MG tablet Commonly known as: DELTASONE   triamcinolone ointment 0.1 % Commonly known as: KENALOG     TAKE these medications   acetaminophen 325 MG tablet Commonly known as: TYLENOL Take 2 tablets (650 mg total) by mouth every 4 (four) hours as needed for headache or mild pain.   albuterol 108 (90 Base) MCG/ACT inhaler Commonly known as: VENTOLIN HFA Inhale 2 puffs into the lungs every 6 (six) hours as needed for wheezing or shortness of breath.  amLODipine 10 MG tablet Commonly known as: NORVASC Take 1 tablet (10 mg total) by mouth daily.   cefdinir 300 MG capsule Commonly known as: OMNICEF Take 1 capsule (300 mg total) by mouth every 12 (twelve) hours.   ferrous gluconate 324 MG tablet Commonly known as: FERGON Take 1 tablet (324 mg total) by mouth daily with breakfast.   Fluticasone-Salmeterol 250-50 MCG/DOSE Aepb Commonly known as: Advair Diskus Inhale 1 puff into the lungs 2 (two) times daily.   furosemide 80 MG tablet Commonly known as: LASIX Take 1 tablet (80 mg  total) by mouth 2 (two) times daily. What changed: when to take this   gemfibrozil 600 MG tablet Commonly known as: LOPID Take 1 tablet (600 mg total) by mouth 2 (two) times daily with a meal.   glimepiride 2 MG tablet Commonly known as: AMARYL Take 1 tablet (2 mg total) by mouth daily before breakfast.   hydrALAZINE 100 MG tablet Commonly known as: APRESOLINE Take 1 tablet (100 mg total) by mouth 3 (three) times daily.   ipratropium-albuterol 0.5-2.5 (3) MG/3ML Soln Commonly known as: DUONEB Take 3 mLs by nebulization every 6 (six) hours as needed.   isosorbide mononitrate 30 MG 24 hr tablet Commonly known as: IMDUR Take 1 tablet (30 mg total) by mouth 2 (two) times daily.   loratadine 10 MG tablet Commonly known as: CLARITIN Take 1 tablet (10 mg total) by mouth daily.   metoprolol succinate 50 MG 24 hr tablet Commonly known as: TOPROL-XL Take 1 tablet (50 mg total) by mouth at bedtime. Take with or immediately following a meal.   nitroGLYCERIN 0.4 MG SL tablet Commonly known as: NITROSTAT Place 1 tablet (0.4 mg total) under the tongue every 5 (five) minutes x 3 doses as needed for chest pain.   NovoFine 32G X 6 MM Misc Generic drug: Insulin Pen Needle To use with [place] injections daily   omeprazole 40 MG capsule Commonly known as: PRILOSEC Take 1 capsule (40 mg total) by mouth daily.   potassium chloride SA 20 MEQ tablet Commonly known as: KLOR-CON Take 1 tablet (20 mEq total) by mouth daily.   Spiriva HandiHaler 18 MCG inhalation capsule Generic drug: tiotropium Place 1 capsule (18 mcg total) into inhaler and inhale daily.   Toujeo SoloStar 300 UNIT/ML Sopn Generic drug: Insulin Glargine (1 Unit Dial) Inject 26 Units into the skin every evening.   True Metrix Air Glucose Meter Devi 1 Device by Does not apply route 2 (two) times daily.   True Metrix Blood Glucose Test test strip Generic drug: glucose blood Use as instructed twice daily diag E11.65   True  Metrix Level 1 Low Soln Use as directed   TRUEplus Lancets 33G Misc Use as directed twice daily diag E11.65        DISCHARGE INSTRUCTIONS:   Recommending following up with one physician every week whether it's a primary care physician or cardiologist.  I think the patient needs to be seen often in order to avoid hospital readmissions.  Follow-up cardiology 1 week follow-up PMD 5 days  If you experience worsening of your admission symptoms, develop shortness of breath, life threatening emergency, suicidal or homicidal thoughts you must seek medical attention immediately by calling 911 or calling your MD immediately  if symptoms less severe.  You Must read complete instructions/literature along with all the possible adverse reactions/side effects for all the Medicines you take and that have been prescribed to you. Take any new Medicines after you have completely  understood and accept all the possible adverse reactions/side effects.   Please note  You were cared for by a hospitalist during your hospital stay. If you have any questions about your discharge medications or the care you received while you were in the hospital after you are discharged, you can call the unit and asked to speak with the hospitalist on call if the hospitalist that took care of you is not available. Once you are discharged, your primary care physician will handle any further medical issues. Please note that NO REFILLS for any discharge medications will be authorized once you are discharged, as it is imperative that you return to your primary care physician (or establish a relationship with a primary care physician if you do not have one) for your aftercare needs so that they can reassess your need for medications and monitor your lab values.    Today   CHIEF COMPLAINT:   Chief Complaint  Patient presents with  . Chest Pain    HISTORY OF PRESENT ILLNESS:  Phillip Maffei  is a 63 y.o. male came in with chest  pain   VITAL SIGNS:  Blood pressure 138/78, pulse 70, temperature (!) 97.5 F (36.4 C), temperature source Oral, resp. rate 19, height 5\' 11"  (1.803 m), weight 121.2 kg, SpO2 97 %.  I/O:    Intake/Output Summary (Last 24 hours) at 05/24/2019 1324 Last data filed at 05/24/2019 0900 Gross per 24 hour  Intake 480 ml  Output 1750 ml  Net -1270 ml    PHYSICAL EXAMINATION:  GENERAL:  63 y.o.-year-old patient lying in the bed with no acute distress.  EYES: Pupils equal, round, reactive to light and accommodation. No scleral icterus. Extraocular muscles intact.  HEENT: Head atraumatic, normocephalic. Oropharynx and nasopharynx clear.  NECK:  Supple, no jugular venous distention. No thyroid enlargement, no tenderness.  LUNGS: Normal breath sounds bilaterally, no wheezing, rales,rhonchi or crepitation. No use of accessory muscles of respiration.  CARDIOVASCULAR: S1, S2 normal. No murmurs, rubs, or gallops.  ABDOMEN: Soft, non-tender, non-distended. Bowel sounds present. No organomegaly or mass.  EXTREMITIES: Trace pedal edema.no cyanosis, or clubbing.  NEUROLOGIC: Cranial nerves II through XII are intact. Muscle strength 5/5 in all extremities. Sensation intact. Gait not checked.  PSYCHIATRIC: The patient is alert and oriented x 3.  SKIN: No obvious rash, lesion, or ulcer.   DATA REVIEW:   CBC Recent Labs  Lab 05/20/19 0311  WBC 7.6  HGB 10.2*  HCT 31.1*  PLT 189    Chemistries  Recent Labs  Lab 05/21/19 0851 05/23/19 0526  NA 133* 137  K 4.2 3.8  CL 103 105  CO2 18* 21*  GLUCOSE 290* 285*  BUN 50* 62*  CREATININE 2.08* 2.11*  CALCIUM 8.8* 9.1  MG 2.1  --     Microbiology Results  Results for orders placed or performed during the hospital encounter of 05/19/19  SARS CORONAVIRUS 2 (TAT 6-24 HRS) Nasopharyngeal Nasopharyngeal Swab     Status: None   Collection Time: 05/19/19  8:21 PM   Specimen: Nasopharyngeal Swab  Result Value Ref Range Status   SARS Coronavirus  2 NEGATIVE NEGATIVE Final    Comment: (NOTE) SARS-CoV-2 target nucleic acids are NOT DETECTED. The SARS-CoV-2 RNA is generally detectable in upper and lower respiratory specimens during the acute phase of infection. Negative results do not preclude SARS-CoV-2 infection, do not rule out co-infections with other pathogens, and should not be used as the sole basis for treatment or other patient management  decisions. Negative results must be combined with clinical observations, patient history, and epidemiological information. The expected result is Negative. Fact Sheet for Patients: SugarRoll.be Fact Sheet for Healthcare Providers: https://www.woods-mathews.com/ This test is not yet approved or cleared by the [place] FDA and  has been authorized for detection and/or diagnosis of SARS-CoV-2 by FDA under an Emergency Use Authorization (EUA). This EUA will remain  in effect (meaning this test can be used) for the duration of the COVID-19 declaration under Section 56 4(b)(1) of the Act, 21 U.S.C. section 360bbb-3(b)(1), unless the authorization is terminated or revoked sooner. Performed at [facility], [place] [address] [place], [place] [address]      Management plans discussed with the patient, family and they are in agreement.  CODE STATUS:     Code Status Orders  (From admission, onward)         Start     Ordered   05/19/19 2129  Full code  Continuous     05/19/19 2136        Code Status History    Date Active Date Inactive Code Status Order ID Comments User Context   05/16/2019 0118 05/18/2019 1503 Full Code [id number]  Orene Desanctis, DO ED   05/09/2019 1352 05/13/2019 2257 Full Code [id number]  Damita Lack, MD ED   04/24/2019 2038 04/30/2019 1406 Full Code [id number]  Caren Griffins, MD Inpatient   04/10/2019 1958 04/13/2019 1718 Full Code [id number]  Loletha Grayer, MD ED   03/26/2019 1532 03/31/2019 1723 Full  Code [id number]  Loletha Grayer, MD ED   03/01/2019 1536 03/05/2019 2055 Full Code [id number]  Loletha Grayer, MD ED   Advance Care Planning Activity      TOTAL TIME TAKING CARE OF THIS PATIENT: 35 minutes.    Loletha Grayer M.D on 05/24/2019 at 1:24 PM  Between 7am to 6pm - Pager - [phone]  After 6pm go to [email or web address] - password [name]  [facility]  CC: Primary care physician; [name], NP

## 2019-05-27 NOTE — Telephone Encounter (Signed)
TCM - first attempt. No answer. Left message to call back.

## 2019-05-28 ENCOUNTER — Ambulatory Visit: Payer: Medicare PPO | Admitting: Internal Medicine

## 2019-05-28 ENCOUNTER — Telehealth: Payer: Self-pay

## 2019-05-28 NOTE — Telephone Encounter (Signed)
TCM- 2nd attempt No answer- I left a message to please call back.

## 2019-05-28 NOTE — Telephone Encounter (Signed)
Left message and asked pt to call back or have sister call back with update on pt health. He was supposed to be seen for hospital follow up and if he cant come in office we can do virtual/telephone follow up. Beth

## 2019-05-29 ENCOUNTER — Encounter (HOSPITAL_COMMUNITY): Payer: Self-pay

## 2019-05-29 ENCOUNTER — Other Ambulatory Visit (HOSPITAL_COMMUNITY): Payer: Self-pay

## 2019-05-29 NOTE — Telephone Encounter (Signed)
TOC- 3rd attempt. lmtcb.

## 2019-05-29 NOTE — Progress Notes (Signed)
BGL: 104.  Today was first home visit with Larry Callahan.  Explained the program to him and he is willing to be part of it.  Discussed diet and fluid intake with him.  Advised him to call if he has 3 lbs over night weight gain or 5 lbs in a week.  He gets his medications delivered from a local pharmacy and he appears capable to take his medications like he is suppose to.  He was able to show me his medications and explain when he takes them.  He denies chest pain, shortness of breath, headaches or dizziness.  No swelling in extremities and he denies abdominal tightness.  Lungs were clear.  Left information about heart failure and diet.  Will continue to visit for heart failure.  [place] [phone]

## 2019-05-30 ENCOUNTER — Ambulatory Visit: Payer: Medicare PPO | Admitting: Family

## 2019-05-30 ENCOUNTER — Telehealth (HOSPITAL_COMMUNITY): Payer: Self-pay

## 2019-05-30 ENCOUNTER — Telehealth: Payer: Self-pay | Admitting: Family

## 2019-05-30 NOTE — Telephone Encounter (Signed)
Patient did not show for his Heart Failure Clinic appointment on 05/30/2019. Will attempt to reschedule.

## 2019-05-30 NOTE — Telephone Encounter (Signed)
Spoke to Larry Callahan and his sister [place] about him missing appts with HF clinic and he will not have a ride to appt with Cardiology tomorrow.  [place] can not keep up with so many appts between both of them.  They do not want to see HF clinic, they want to keep seeing Dr Rockey Situ.  They said they go to 5 to 6 appts a week and it is too much running.  Also sent message to [facility] at Dr Gwenyth Ober office to reschedule his appt for tomorrow, he does not have a ride because [place] is busy.  Also sent Darylene Price with HF clinic advising her he does not want the appts.  Attempted to explain why the HF clinic is important and will continue to try to get him to come in to the HF clinic when I visit him at home.    [place] [phone]

## 2019-05-31 ENCOUNTER — Ambulatory Visit: Payer: Medicare PPO | Admitting: Nurse Practitioner

## 2019-05-31 ENCOUNTER — Encounter (HOSPITAL_COMMUNITY): Payer: Self-pay

## 2019-05-31 ENCOUNTER — Other Ambulatory Visit (HOSPITAL_COMMUNITY): Payer: Self-pay

## 2019-05-31 ENCOUNTER — Telehealth: Payer: Self-pay

## 2019-05-31 NOTE — Telephone Encounter (Signed)
lmom to confirm and screen for 06-04-19 ov.

## 2019-05-31 NOTE — Progress Notes (Deleted)
Cardiology Office Note  Date: 05/31/2019   ID: Walther, Sanagustin 1956-01-23, MRN [id number]  PCP:  [name], NP  Cardiologist:  [name], MD Electrophysiologist:  None   No chief complaint on file.   History of Present Illness: Larry Callahan is a 63 y.o. male last seen by Dr. Rockey Situ March 12, 2019 for an ED follow-up for shortness of breath, chest pain and CHF.  He has a history of morbid obesity, hypertension, type 2 diabetes, chronic renal insufficiency with a single kidney, obstructive sleep apnea on CPAP, smoker, hypertensive heart disease and diastolic dysfunction.  He had a recent hospital admission  December 6 after presenting with chest pain acute onset described as dull feeling across both sides of his chest with burning and radiation to his neck and throat.  He had denied any associated shortness of breath.  He also stated he had fallen that day injuring his left hip and low back causing pain and numbness in bilateral lower extremities.  Chest x-ray showed possible bibasilar infiltrates which appear new compared to her previous imaging.  His Covid test was negative.  His discharge diagnoses were COPD with acute exacerbation, community-acquired pneumonia, pleuritic chest pain, acute on chronic diastolic heart failure, lobar pneumonia, type 2 diabetes, hypokalemia, hypomagnesemia, chronic kidney disease stage III due to type 2 diabetes.  He was referred to the heart failure clinic but did not show for his first appointment.   Past Medical History:  Diagnosis Date  . Allergy   . Coronary artery disease   . Diabetes mellitus without complication ([place])   . Diastolic CHF ([place])   . GERD (gastroesophageal reflux disease)   . Hyperlipidemia   . Hypertension   . Renal insufficiency   . Sleep apnea     Past Surgical History:  Procedure Laterality Date  . BACK SURGERY    . CHOLECYSTECTOMY    . COLONOSCOPY WITH PROPOFOL N/A 04/12/2019   Procedure: COLONOSCOPY WITH PROPOFOL;  Surgeon: [name], MD;  Location: [facility] ENDOSCOPY;  Service: Gastroenterology;  Laterality: N/A;  . ESOPHAGOGASTRODUODENOSCOPY N/A 04/12/2019   Procedure: ESOPHAGOGASTRODUODENOSCOPY (EGD);  Surgeon: [name], MD;  Location: [facility] ENDOSCOPY;  Service: Gastroenterology;  Laterality: N/A;  . FLEXIBLE BRONCHOSCOPY Bilateral 05/17/2019   Procedure: FLEXIBLE BRONCHOSCOPY;  Surgeon: [name], MD;  Location: [name] ORS;  Service: Pulmonary;  Laterality: Bilateral;  . left arm surgery    . nephrectomy Left   . PARATHYROIDECTOMY    . RIGHT HEART CATH N/A 03/29/2019   Procedure: RIGHT HEART CATH;  Surgeon: [name], MD;  Location: [place] CV LAB;  Service: Cardiovascular;  Laterality: N/A;    Current Outpatient Medications  Medication Sig Dispense Refill  . acetaminophen (TYLENOL) 325 MG tablet Take 2 tablets (650 mg total) by mouth every 4 (four) hours as needed for headache or mild pain.    Marland Kitchen albuterol (VENTOLIN HFA) 108 (90 Base) MCG/ACT inhaler Inhale 2 puffs into the lungs every 6 (six) hours as needed for wheezing or shortness of breath. 18 g 0  . amLODipine (NORVASC) 10 MG tablet Take 1 tablet (10 mg total) by mouth daily. 90 tablet 2  . Blood Glucose Calibration (TRUE METRIX LEVEL 1) Low SOLN Use as directed 1 each 1  . Blood Glucose Monitoring Suppl (TRUE METRIX AIR GLUCOSE METER) DEVI 1 Device by Does not apply route 2 (two) times daily. 1 Device 0  . cefdinir (OMNICEF) 300 MG capsule Take 1 capsule (  300 mg total) by mouth every 12 (twelve) hours. 2 capsule 0  . ferrous gluconate (FERGON) 324 MG tablet Take 1 tablet (324 mg total) by mouth daily with breakfast. 30 tablet 3  . Fluticasone-Salmeterol (ADVAIR DISKUS) 250-50 MCG/DOSE AEPB Inhale 1 puff into the lungs 2 (two) times daily. 60 each 0  . furosemide (LASIX) 80 MG tablet Take 1 tablet (80 mg total) by mouth 2 (two) times daily. 60 tablet 0  . gemfibrozil (LOPID)  600 MG tablet Take 1 tablet (600 mg total) by mouth 2 (two) times daily with a meal. 180 tablet 1  . glimepiride (AMARYL) 2 MG tablet Take 1 tablet (2 mg total) by mouth daily before breakfast. 30 tablet 3  . glucose blood (TRUE METRIX BLOOD GLUCOSE TEST) test strip Use as instructed twice daily diag E11.65 100 each 3  . hydrALAZINE (APRESOLINE) 100 MG tablet Take 1 tablet (100 mg total) by mouth 3 (three) times daily. 60 tablet 1  . Insulin Glargine, 1 Unit Dial, (TOUJEO SOLOSTAR) 300 UNIT/ML SOPN Inject 26 Units into the skin every evening. 5 pen 5  . ipratropium-albuterol (DUONEB) 0.5-2.5 (3) MG/3ML SOLN Take 3 mLs by nebulization every 6 (six) hours as needed. 1080 mL 2  . isosorbide mononitrate (IMDUR) 30 MG 24 hr tablet Take 1 tablet (30 mg total) by mouth 2 (two) times daily. 60 tablet 0  . loratadine (CLARITIN) 10 MG tablet Take 1 tablet (10 mg total) by mouth daily.    . metoprolol succinate (TOPROL-XL) 50 MG 24 hr tablet Take 1 tablet (50 mg total) by mouth at bedtime. Take with or immediately following a meal. 90 tablet 2  . nitroGLYCERIN (NITROSTAT) 0.4 MG SL tablet Place 1 tablet (0.4 mg total) under the tongue every 5 (five) minutes x 3 doses as needed for chest pain. 30 tablet 0  . NOVOFINE 32G X 6 MM MISC To use with [place] injections daily 100 each 4  . omeprazole (PRILOSEC) 40 MG capsule Take 1 capsule (40 mg total) by mouth daily. 90 capsule 1  . potassium chloride SA (KLOR-CON) 20 MEQ tablet Take 1 tablet (20 mEq total) by mouth daily. 30 tablet 0  . tiotropium (SPIRIVA HANDIHALER) 18 MCG inhalation capsule Place 1 capsule (18 mcg total) into inhaler and inhale daily. 30 capsule 0  . TRUEPLUS LANCETS 33G MISC Use as directed twice daily diag E11.65 100 each 3   No current facility-administered medications for this visit.   Allergies:  Penicillins   Social History: The patient  reports that he has quit smoking. He has quit using smokeless tobacco.  His smokeless tobacco use  included chew. He reports that he does not drink alcohol or use drugs.   Family History: The patient's family history includes Bone cancer in his brother; Cancer in his sister; Diabetes in his mother and sister; Heart disease in his sister; Hypertension in his sister; Lung cancer in his father.   ROS:  Please see the history of present illness. Otherwise, complete review of systems is positive for none.  All other systems are reviewed and negative.   Physical Exam: VS:  There were no vitals taken for this visit., BMI There is no height or weight on file to calculate BMI.  Wt Readings from Last 3 Encounters:  05/29/19 221 lb 9.6 oz (100.5 kg)  05/24/19 267 lb 4.8 oz (121.2 kg)  05/15/19 267 lb 6.7 oz (121.3 kg)    General: Patient appears comfortable at rest. HEENT: Conjunctiva and  lids normal, oropharynx clear with moist mucosa. Neck: Supple, no elevated JVP or carotid bruits, no thyromegaly. Lungs: Clear to auscultation, nonlabored breathing at rest. Cardiac: Regular rate and rhythm, no S3 or significant systolic murmur, no pericardial rub. Abdomen: Soft, nontender, no hepatomegaly, bowel sounds present, no guarding or rebound. Extremities: No pitting edema, distal pulses 2+. Skin: Warm and dry. Musculoskeletal: No kyphosis. Neuropsychiatric: Alert and oriented x3, affect grossly appropriate.  ECG:  An ECG dated May 19, 2019 was personally reviewed today and demonstrated:  Normal sinus rhythm rate of 75, ST and T wave abnormality consider lateral ischemia  Recent Labwork: 05/12/2019: ALT 18; AST 18 05/19/2019: B Natriuretic Peptide 143.0 05/20/2019: Hemoglobin 10.2; Platelets 189 05/21/2019: Magnesium 2.1 05/23/2019: BUN 62; Creatinine, Ser 2.11; Potassium 3.8; Sodium 137     Component Value Date/Time   CHOL 166 03/02/2019 0553   CHOL 151 02/27/2018 0844   CHOL 137 12/07/2012 0518   TRIG 166 (H) 03/02/2019 0553   TRIG 98 12/07/2012 0518   HDL 35 (L) 03/02/2019 0553   HDL 36  (L) 02/27/2018 0844   HDL 45 12/07/2012 0518   CHOLHDL 4.7 03/02/2019 0553   VLDL 33 03/02/2019 0553   VLDL 20 12/07/2012 0518   LDLCALC 98 03/02/2019 0553   LDLCALC 94 02/27/2018 0844   LDLCALC 72 12/07/2012 0518    Other Studies Reviewed Today: Nuclear stress test on March 04, 2019  This is a high risk study due to reduced systolic function. There is no ischemia.  There was no ST segment deviation noted during stress.  The left ventricular ejection fraction is severely decreased (<30%). However the veracity of these findings is unclear given poor tracer uptake.  Resting images are poor due to reduced tracer uptake.  Recommend echo for systolic function assessment   Cardiac Catheterization Procedure Note March 29, 2019 Procedure: Right heart catheterization Procedural Findings:  Pulmonary capillary wedge pressure mean of 8 PA: 32/16 mean 23 RA mean of 10 RV 26/12/13   Cardiac output 8.17 Cardiac index 3.42  Final Conclusions:   Normal right heart pressures  Recommendations:  Etiology of his shortness of breath likely multifactorial He is at euvolemic state at this time, no aggressive diuresis needed Needs referral to pulmonary for COPD work-up, long smoking history He may benefit from lung Works, exercise as needed sedentary lifestyle and is deconditioned -Recommended weight loss, lifestyle modification given obesity contributing to his shortness of breath symptoms  Bilateral lower extremity venous Doppler ultrasound April 09, 2019 IMPRESSION: No femoropopliteal and no calf DVT in the visualized calf veins. If clinical symptoms are inconsistent or if there are persistent or worsening symptoms, further imaging (possibly involving the iliac veins) may be warranted.  Echocardiogram performed on April 27, 2019 1. Left ventricular ejection fraction, by visual estimation, is 45 to 50%.  This was decreased from previous study in September when EF was 55 to  60%.  The left ventricle has mildly decreased function. There is mildly increased left ventricular hypertrophy. 2. Left ventricular diastolic parameters are indeterminate.  Last echo in September showed elevated left ventricular end-diastolic pressure.  Previous echo in July showed grade 2 diastolic dysfunction. 3. Global right ventricle has normal systolic function.The right ventricular size is normal. No increase in right ventricular wall thickness. 4. Left atrial size was moderately dilated. 5. Right atrial size was normal. 6. The mitral valve is normal in structure. No evidence of mitral valve regurgitation. No evidence of mitral stenosis. 7. The tricuspid valve is normal  in structure. Tricuspid valve regurgitation is not demonstrated. 8. The aortic valve is normal in structure. Aortic valve regurgitation is not visualized. Mild to moderate aortic valve sclerosis/calcification without any evidence of aortic stenosis. 9. The pulmonic valve was normal in structure. Pulmonic valve regurgitation is not visualized. 10. TR signal is inadequate for assessing pulmonary artery systolic pressure. 11. The inferior vena cava is normal in size with greater than 50% respiratory variability, suggesting right atrial pressure of 3 mmHg.  Assessment and Plan:  1. Chronic combined systolic and diastolic congestive heart failure ([place])   2. Essential hypertension   3. Mixed hyperlipidemia   4. Chronic obstructive pulmonary disease, unspecified COPD type ([place])    1. Chronic combined systolic and diastolic congestive heart failure ([facility]) ***  2. Essential hypertension ***  3. Mixed hyperlipidemia ***  4. Chronic obstructive pulmonary disease, unspecified COPD type ([facility]) ***  Medication Adjustments/Labs and Tests Ordered: Current medicines are reviewed at length with the patient today.  Concerns regarding medicines are outlined above.    There are no Patient Instructions on file for this visit.        Signed, Levell July, NP 05/31/2019 10:57 AM    [facility] [address], [place], [place] [address] Phone: [phone]; Fax: [phone]

## 2019-05-31 NOTE — Progress Notes (Signed)
Today Larry Callahan contacted me and advised Larry Callahan had chest pain yesterday that eased up without meds and today his neck is hurting and dont feel good.  Did a home visit with Larry Callahan and when arrived his complaint if no taste, no smell, headache and neck pain.  Larry Callahan denies shortness of breath or chest pain.  Larry Callahan states feels ok except for headache.  Larry Callahan also complains his sugar is elevated.  His sugar is 345 after meds oatmeal.  Advised Larry Callahan to check it right before lunch and let me know what it is.  Reached out to [place] at [facility] about [place] symptoms of possible covid, she and talked for Larry Callahan to get tested Larry Callahan has to have a ride, therefore exposing someone else.  Advised Larry Callahan to stay at home, isolate his self, if Larry Callahan started having uncontrollable chills or shortness of breath to call 911 and get seen at ED.  Advise everyone Larry Callahan has been in contact with last several days.  Take tylenol as needed for aches.  Larry Callahan states Larry Callahan feels comfortable staying at home right now and appears to understand to call 911 if shortness of breath happens. Also contacted 911 and advised them to note his address of possible covid if Larry Callahan calls 911.  Lungs are clear, pulse ox is 97% on room air. 12 lead shows sinus rhythem.  Larry Callahan is alert and appears able to make his own decisions.  Will follow up with pt this evening by phone.    Yates Dec[employer] EMT-Paramedic [phone]

## 2019-06-03 ENCOUNTER — Telehealth: Payer: Self-pay

## 2019-06-03 NOTE — Telephone Encounter (Signed)
Rescheduled appointment on 06/04/2019 to 07/02/2019. klh

## 2019-06-04 ENCOUNTER — Ambulatory Visit: Payer: Medicare PPO | Admitting: Internal Medicine

## 2019-06-07 LAB — CULTURE, FUNGUS WITHOUT SMEAR

## 2019-06-11 ENCOUNTER — Ambulatory Visit: Payer: Medicare PPO | Admitting: Family

## 2019-06-13 ENCOUNTER — Other Ambulatory Visit: Payer: Self-pay

## 2019-06-13 MED ORDER — HYDRALAZINE HCL 100 MG PO TABS: 100.0000 mg | ORAL_TABLET | Freq: Three times a day (TID) | ORAL | 1 refills | Status: DC

## 2019-06-17 ENCOUNTER — Emergency Department: Payer: Medicare PPO

## 2019-06-17 ENCOUNTER — Other Ambulatory Visit: Payer: Self-pay

## 2019-06-17 ENCOUNTER — Observation Stay
Admission: EM | Admit: 2019-06-17 | Discharge: 2019-06-18 | Disposition: A | Discharge status: Home / Self Care | Payer: Medicare PPO | Attending: Internal Medicine | Admitting: Internal Medicine

## 2019-06-17 ENCOUNTER — Encounter: Payer: Self-pay | Admitting: Internal Medicine

## 2019-06-17 ENCOUNTER — Telehealth: Payer: Self-pay

## 2019-06-17 DIAGNOSIS — E1142 Type 2 diabetes mellitus with diabetic polyneuropathy: Secondary | ICD-10-CM | POA: Diagnosis not present

## 2019-06-17 DIAGNOSIS — Z20822 Contact with and (suspected) exposure to covid-19: Secondary | ICD-10-CM | POA: Diagnosis not present

## 2019-06-17 DIAGNOSIS — R262 Difficulty in walking, not elsewhere classified: Secondary | ICD-10-CM | POA: Diagnosis present

## 2019-06-17 DIAGNOSIS — J440 Chronic obstructive pulmonary disease with acute lower respiratory infection: Secondary | ICD-10-CM | POA: Diagnosis not present

## 2019-06-17 DIAGNOSIS — M160 Bilateral primary osteoarthritis of hip: Secondary | ICD-10-CM | POA: Diagnosis not present

## 2019-06-17 DIAGNOSIS — R042 Hemoptysis: Secondary | ICD-10-CM | POA: Diagnosis not present

## 2019-06-17 DIAGNOSIS — R05 Cough: Secondary | ICD-10-CM | POA: Diagnosis not present

## 2019-06-17 DIAGNOSIS — J441 Chronic obstructive pulmonary disease with (acute) exacerbation: Secondary | ICD-10-CM | POA: Diagnosis not present

## 2019-06-17 DIAGNOSIS — E892 Postprocedural hypoparathyroidism: Secondary | ICD-10-CM | POA: Insufficient documentation

## 2019-06-17 DIAGNOSIS — Z7951 Long term (current) use of inhaled steroids: Secondary | ICD-10-CM | POA: Insufficient documentation

## 2019-06-17 DIAGNOSIS — D509 Iron deficiency anemia, unspecified: Secondary | ICD-10-CM | POA: Insufficient documentation

## 2019-06-17 DIAGNOSIS — Z9049 Acquired absence of other specified parts of digestive tract: Secondary | ICD-10-CM | POA: Insufficient documentation

## 2019-06-17 DIAGNOSIS — S3992XA Unspecified injury of lower back, initial encounter: Secondary | ICD-10-CM | POA: Diagnosis not present

## 2019-06-17 DIAGNOSIS — I2510 Atherosclerotic heart disease of native coronary artery without angina pectoris: Secondary | ICD-10-CM | POA: Insufficient documentation

## 2019-06-17 DIAGNOSIS — Z905 Acquired absence of kidney: Secondary | ICD-10-CM | POA: Insufficient documentation

## 2019-06-17 DIAGNOSIS — J449 Chronic obstructive pulmonary disease, unspecified: Secondary | ICD-10-CM | POA: Diagnosis present

## 2019-06-17 DIAGNOSIS — I130 Hypertensive heart and chronic kidney disease with heart failure and stage 1 through stage 4 chronic kidney disease, or unspecified chronic kidney disease: Secondary | ICD-10-CM | POA: Insufficient documentation

## 2019-06-17 DIAGNOSIS — R0602 Shortness of breath: Secondary | ICD-10-CM | POA: Diagnosis not present

## 2019-06-17 DIAGNOSIS — N179 Acute kidney failure, unspecified: Secondary | ICD-10-CM | POA: Insufficient documentation

## 2019-06-17 DIAGNOSIS — N2581 Secondary hyperparathyroidism of renal origin: Secondary | ICD-10-CM | POA: Insufficient documentation

## 2019-06-17 DIAGNOSIS — J181 Lobar pneumonia, unspecified organism: Secondary | ICD-10-CM | POA: Diagnosis not present

## 2019-06-17 DIAGNOSIS — Z794 Long term (current) use of insulin: Secondary | ICD-10-CM | POA: Insufficient documentation

## 2019-06-17 DIAGNOSIS — K219 Gastro-esophageal reflux disease without esophagitis: Secondary | ICD-10-CM | POA: Diagnosis not present

## 2019-06-17 DIAGNOSIS — E1122 Type 2 diabetes mellitus with diabetic chronic kidney disease: Secondary | ICD-10-CM | POA: Insufficient documentation

## 2019-06-17 DIAGNOSIS — W19XXXA Unspecified fall, initial encounter: Secondary | ICD-10-CM | POA: Insufficient documentation

## 2019-06-17 DIAGNOSIS — Z808 Family history of malignant neoplasm of other organs or systems: Secondary | ICD-10-CM | POA: Insufficient documentation

## 2019-06-17 DIAGNOSIS — I700 Atherosclerosis of aorta: Secondary | ICD-10-CM | POA: Diagnosis not present

## 2019-06-17 DIAGNOSIS — R52 Pain, unspecified: Secondary | ICD-10-CM | POA: Diagnosis not present

## 2019-06-17 DIAGNOSIS — N1832 Chronic kidney disease, stage 3b: Secondary | ICD-10-CM | POA: Diagnosis not present

## 2019-06-17 DIAGNOSIS — Z8249 Family history of ischemic heart disease and other diseases of the circulatory system: Secondary | ICD-10-CM | POA: Insufficient documentation

## 2019-06-17 DIAGNOSIS — I5032 Chronic diastolic (congestive) heart failure: Secondary | ICD-10-CM | POA: Diagnosis not present

## 2019-06-17 DIAGNOSIS — Z79899 Other long term (current) drug therapy: Secondary | ICD-10-CM | POA: Insufficient documentation

## 2019-06-17 DIAGNOSIS — R000 Tachycardia, unspecified: Secondary | ICD-10-CM | POA: Diagnosis not present

## 2019-06-17 DIAGNOSIS — E876 Hypokalemia: Secondary | ICD-10-CM | POA: Diagnosis not present

## 2019-06-17 DIAGNOSIS — N184 Chronic kidney disease, stage 4 (severe): Secondary | ICD-10-CM | POA: Diagnosis present

## 2019-06-17 DIAGNOSIS — K5730 Diverticulosis of large intestine without perforation or abscess without bleeding: Secondary | ICD-10-CM | POA: Diagnosis not present

## 2019-06-17 DIAGNOSIS — I708 Atherosclerosis of other arteries: Secondary | ICD-10-CM | POA: Diagnosis not present

## 2019-06-17 DIAGNOSIS — Z801 Family history of malignant neoplasm of trachea, bronchus and lung: Secondary | ICD-10-CM | POA: Insufficient documentation

## 2019-06-17 DIAGNOSIS — M549 Dorsalgia, unspecified: Secondary | ICD-10-CM | POA: Diagnosis not present

## 2019-06-17 DIAGNOSIS — E782 Mixed hyperlipidemia: Secondary | ICD-10-CM | POA: Diagnosis not present

## 2019-06-17 DIAGNOSIS — M48061 Spinal stenosis, lumbar region without neurogenic claudication: Secondary | ICD-10-CM | POA: Insufficient documentation

## 2019-06-17 DIAGNOSIS — M25551 Pain in right hip: Principal | ICD-10-CM | POA: Insufficient documentation

## 2019-06-17 DIAGNOSIS — I10 Essential (primary) hypertension: Secondary | ICD-10-CM | POA: Diagnosis present

## 2019-06-17 DIAGNOSIS — G4733 Obstructive sleep apnea (adult) (pediatric): Secondary | ICD-10-CM | POA: Insufficient documentation

## 2019-06-17 DIAGNOSIS — Z809 Family history of malignant neoplasm, unspecified: Secondary | ICD-10-CM | POA: Insufficient documentation

## 2019-06-17 DIAGNOSIS — N400 Enlarged prostate without lower urinary tract symptoms: Secondary | ICD-10-CM | POA: Insufficient documentation

## 2019-06-17 DIAGNOSIS — Z87891 Personal history of nicotine dependence: Secondary | ICD-10-CM | POA: Insufficient documentation

## 2019-06-17 DIAGNOSIS — Z833 Family history of diabetes mellitus: Secondary | ICD-10-CM | POA: Insufficient documentation

## 2019-06-17 DIAGNOSIS — Z880 Allergy status to penicillin: Secondary | ICD-10-CM | POA: Insufficient documentation

## 2019-06-17 DIAGNOSIS — Z85528 Personal history of other malignant neoplasm of kidney: Secondary | ICD-10-CM | POA: Insufficient documentation

## 2019-06-17 DIAGNOSIS — S79911A Unspecified injury of right hip, initial encounter: Secondary | ICD-10-CM | POA: Diagnosis not present

## 2019-06-17 LAB — GLUCOSE, CAPILLARY: Glucose-Capillary: 191 mg/dL — ABNORMAL HIGH (ref 70–99)

## 2019-06-17 LAB — URINALYSIS, COMPLETE (UACMP) WITH MICROSCOPIC
Bilirubin Urine: NEGATIVE
Glucose, UA: NEGATIVE mg/dL
Hgb urine dipstick: NEGATIVE
Ketones, ur: NEGATIVE mg/dL
Leukocytes,Ua: NEGATIVE
Nitrite: NEGATIVE
Protein, ur: >=300 mg/dL — AB
Specific Gravity, Urine: 1.01 (ref 1.005–1.030)
Squamous Epithelial / HPF: NONE SEEN (ref 0–5)
pH: 5 (ref 5.0–8.0)

## 2019-06-17 LAB — CBC WITH DIFFERENTIAL/PLATELET
Abs Immature Granulocytes: 0.05 10*3/uL (ref 0.00–0.07)
Basophils Absolute: 0.1 10*3/uL (ref 0.0–0.1)
Basophils Relative: 1 %
Eosinophils Absolute: 0.1 10*3/uL (ref 0.0–0.5)
Eosinophils Relative: 1 %
HCT: 36.7 % — ABNORMAL LOW (ref 39.0–52.0)
Hemoglobin: 12.1 g/dL — ABNORMAL LOW (ref 13.0–17.0)
Immature Granulocytes: 1 %
Lymphocytes Relative: 16 %
Lymphs Abs: 1.4 10*3/uL (ref 0.7–4.0)
MCH: 28.1 pg (ref 26.0–34.0)
MCHC: 33 g/dL (ref 30.0–36.0)
MCV: 85.2 fL (ref 80.0–100.0)
Monocytes Absolute: 0.7 10*3/uL (ref 0.1–1.0)
Monocytes Relative: 8 %
Neutro Abs: 6.7 10*3/uL (ref 1.7–7.7)
Neutrophils Relative %: 73 %
Platelets: 242 10*3/uL (ref 150–400)
RBC: 4.31 MIL/uL (ref 4.22–5.81)
RDW: 14.6 % (ref 11.5–15.5)
WBC: 9 10*3/uL (ref 4.0–10.5)
nRBC: 0 % (ref 0.0–0.2)

## 2019-06-17 LAB — COMPREHENSIVE METABOLIC PANEL
ALT: 15 U/L (ref 0–44)
AST: 22 U/L (ref 15–41)
Albumin: 3.3 g/dL — ABNORMAL LOW (ref 3.5–5.0)
Alkaline Phosphatase: 82 U/L (ref 38–126)
Anion gap: 17 — ABNORMAL HIGH (ref 5–15)
BUN: 60 mg/dL — ABNORMAL HIGH (ref 8–23)
CO2: 20 mmol/L — ABNORMAL LOW (ref 22–32)
Calcium: 9.4 mg/dL (ref 8.9–10.3)
Chloride: 100 mmol/L (ref 98–111)
Creatinine, Ser: 2.16 mg/dL — ABNORMAL HIGH (ref 0.61–1.24)
GFR calc Af Amer: 36 mL/min — ABNORMAL LOW (ref >60)
GFR calc non Af Amer: 31 mL/min — ABNORMAL LOW (ref >60)
Glucose, Bld: 158 mg/dL — ABNORMAL HIGH (ref 70–99)
Potassium: 3.1 mmol/L — ABNORMAL LOW (ref 3.5–5.1)
Sodium: 137 mmol/L (ref 135–145)
Total Bilirubin: 0.8 mg/dL (ref 0.3–1.2)
Total Protein: 7 g/dL (ref 6.5–8.1)

## 2019-06-17 LAB — APTT: aPTT: 31 seconds (ref 24–36)

## 2019-06-17 LAB — PROTIME-INR
INR: 1 (ref 0.8–1.2)
Prothrombin Time: 13 seconds (ref 11.4–15.2)

## 2019-06-17 LAB — LIPASE, BLOOD: Lipase: 32 U/L (ref 11–51)

## 2019-06-17 MED ORDER — ISOSORBIDE MONONITRATE ER 30 MG PO TB24
30.0000 mg | ORAL_TABLET | Freq: Two times a day (BID) | ORAL | Status: DC
  Administered 2019-06-18 (×2): 30 mg via ORAL
  Filled 2019-06-17 (×4): qty 1

## 2019-06-17 MED ORDER — TRAZODONE HCL 50 MG PO TABS: 50.0000 mg | ORAL_TABLET | Freq: Every evening | ORAL | Status: DC | PRN

## 2019-06-17 MED ORDER — MOMETASONE FURO-FORMOTEROL FUM 200-5 MCG/ACT IN AERO
2.0000 | INHALATION_SPRAY | Freq: Two times a day (BID) | RESPIRATORY_TRACT | Status: DC
  Administered 2019-06-18 (×2): 2 via RESPIRATORY_TRACT
  Filled 2019-06-17: qty 8.8

## 2019-06-17 MED ORDER — GLIMEPIRIDE 2 MG PO TABS
2.0000 mg | ORAL_TABLET | Freq: Every day | ORAL | Status: DC
  Administered 2019-06-18: 2 mg via ORAL
  Filled 2019-06-17 (×2): qty 1

## 2019-06-17 MED ORDER — PANTOPRAZOLE SODIUM 40 MG PO TBEC
40.0000 mg | DELAYED_RELEASE_TABLET | Freq: Every day | ORAL | Status: DC
  Administered 2019-06-17 – 2019-06-18 (×2): 40 mg via ORAL
  Filled 2019-06-17 (×2): qty 1

## 2019-06-17 MED ORDER — ENOXAPARIN SODIUM 40 MG/0.4ML ~~LOC~~ SOLN: 40.0000 mg | SUBCUTANEOUS | Status: DC

## 2019-06-17 MED ORDER — TIOTROPIUM BROMIDE MONOHYDRATE 18 MCG IN CAPS
18.0000 ug | ORAL_CAPSULE | Freq: Every day | RESPIRATORY_TRACT | Status: DC
  Administered 2019-06-18 (×2): 18 ug via RESPIRATORY_TRACT
  Filled 2019-06-17: qty 5

## 2019-06-17 MED ORDER — BISACODYL 5 MG PO TBEC: 5.0000 mg | DELAYED_RELEASE_TABLET | Freq: Every day | ORAL | Status: DC | PRN

## 2019-06-17 MED ORDER — KETOROLAC TROMETHAMINE 30 MG/ML IJ SOLN
15.0000 mg | Freq: Four times a day (QID) | INTRAMUSCULAR | Status: DC | PRN
  Filled 2019-06-17: qty 1

## 2019-06-17 MED ORDER — ONDANSETRON HCL 4 MG PO TABS: 4.0000 mg | ORAL_TABLET | Freq: Four times a day (QID) | ORAL | Status: DC | PRN

## 2019-06-17 MED ORDER — ACETAMINOPHEN 650 MG RE SUPP: 650.0000 mg | Freq: Four times a day (QID) | RECTAL | Status: DC | PRN

## 2019-06-17 MED ORDER — TRAMADOL HCL 50 MG PO TABS
50.0000 mg | ORAL_TABLET | Freq: Four times a day (QID) | ORAL | Status: DC | PRN
  Administered 2019-06-18 (×2): 50 mg via ORAL
  Filled 2019-06-17 (×2): qty 1

## 2019-06-17 MED ORDER — DOXYCYCLINE HYCLATE 100 MG PO TABS
100.0000 mg | ORAL_TABLET | Freq: Once | ORAL | Status: AC
  Administered 2019-06-17: 100 mg via ORAL
  Filled 2019-06-17: qty 1

## 2019-06-17 MED ORDER — OXYCODONE HCL 5 MG PO TABS
5.0000 mg | ORAL_TABLET | Freq: Once | ORAL | Status: AC
  Administered 2019-06-17: 5 mg via ORAL
  Filled 2019-06-17: qty 1

## 2019-06-17 MED ORDER — METOPROLOL SUCCINATE ER 50 MG PO TB24
50.0000 mg | ORAL_TABLET | Freq: Every day | ORAL | Status: DC
  Administered 2019-06-18: 50 mg via ORAL
  Filled 2019-06-17 (×2): qty 1

## 2019-06-17 MED ORDER — IPRATROPIUM-ALBUTEROL 0.5-2.5 (3) MG/3ML IN SOLN
3.0000 mL | Freq: Four times a day (QID) | RESPIRATORY_TRACT | Status: DC | PRN
  Administered 2019-06-18: 3 mL via RESPIRATORY_TRACT
  Filled 2019-06-17: qty 3

## 2019-06-17 MED ORDER — FERROUS GLUCONATE 324 (38 FE) MG PO TABS
324.0000 mg | ORAL_TABLET | Freq: Every day | ORAL | Status: DC
  Administered 2019-06-18: 324 mg via ORAL
  Filled 2019-06-17 (×2): qty 1

## 2019-06-17 MED ORDER — AMLODIPINE BESYLATE 10 MG PO TABS
10.0000 mg | ORAL_TABLET | Freq: Every day | ORAL | Status: DC
  Administered 2019-06-17 – 2019-06-18 (×2): 10 mg via ORAL
  Filled 2019-06-17: qty 1
  Filled 2019-06-17: qty 2

## 2019-06-17 MED ORDER — INSULIN GLARGINE 100 UNIT/ML ~~LOC~~ SOLN
26.0000 [IU] | Freq: Every day | SUBCUTANEOUS | Status: DC
  Administered 2019-06-17: 26 [IU] via SUBCUTANEOUS
  Filled 2019-06-17 (×2): qty 0.26

## 2019-06-17 MED ORDER — NITROGLYCERIN 0.4 MG SL SUBL: 0.4000 mg | SUBLINGUAL_TABLET | SUBLINGUAL | Status: DC | PRN

## 2019-06-17 MED ORDER — GEMFIBROZIL 600 MG PO TABS
600.0000 mg | ORAL_TABLET | Freq: Two times a day (BID) | ORAL | Status: DC
  Administered 2019-06-18: 600 mg via ORAL
  Filled 2019-06-17 (×3): qty 1

## 2019-06-17 MED ORDER — INSULIN ASPART 100 UNIT/ML ~~LOC~~ SOLN
0.0000 [IU] | Freq: Three times a day (TID) | SUBCUTANEOUS | Status: DC
  Administered 2019-06-18: 5 [IU] via SUBCUTANEOUS
  Filled 2019-06-17: qty 1

## 2019-06-17 MED ORDER — POLYETHYLENE GLYCOL 3350 17 G PO PACK: 17.0000 g | PACK | Freq: Every day | ORAL | Status: DC | PRN

## 2019-06-17 MED ORDER — FUROSEMIDE 40 MG PO TABS
80.0000 mg | ORAL_TABLET | Freq: Two times a day (BID) | ORAL | Status: DC
  Administered 2019-06-17 – 2019-06-18 (×2): 80 mg via ORAL
  Filled 2019-06-17: qty 2

## 2019-06-17 MED ORDER — MAGNESIUM CITRATE PO SOLN
1.0000 | Freq: Once | ORAL | Status: DC | PRN
  Filled 2019-06-17: qty 296

## 2019-06-17 MED ORDER — ACETAMINOPHEN 500 MG PO TABS
1000.0000 mg | ORAL_TABLET | Freq: Once | ORAL | Status: AC
  Administered 2019-06-17: 1000 mg via ORAL
  Filled 2019-06-17: qty 2

## 2019-06-17 MED ORDER — ONDANSETRON HCL 4 MG/2ML IJ SOLN: 4.0000 mg | Freq: Four times a day (QID) | INTRAMUSCULAR | Status: DC | PRN

## 2019-06-17 MED ORDER — HYDRALAZINE HCL 50 MG PO TABS
100.0000 mg | ORAL_TABLET | Freq: Three times a day (TID) | ORAL | Status: DC
  Administered 2019-06-17: 100 mg via ORAL
  Filled 2019-06-17 (×2): qty 2

## 2019-06-17 MED ORDER — ACETAMINOPHEN 325 MG PO TABS: 650.0000 mg | ORAL_TABLET | Freq: Four times a day (QID) | ORAL | Status: DC | PRN

## 2019-06-17 MED ORDER — SODIUM CHLORIDE 0.9 % IV SOLN
500.0000 mg | INTRAVENOUS | Status: DC
  Administered 2019-06-17: 500 mg via INTRAVENOUS
  Filled 2019-06-17 (×2): qty 500

## 2019-06-17 MED ORDER — ALBUTEROL SULFATE HFA 108 (90 BASE) MCG/ACT IN AERS
2.0000 | INHALATION_SPRAY | Freq: Four times a day (QID) | RESPIRATORY_TRACT | Status: DC | PRN
  Filled 2019-06-17: qty 6.7

## 2019-06-17 MED ORDER — LORATADINE 10 MG PO TABS
10.0000 mg | ORAL_TABLET | Freq: Every day | ORAL | Status: DC
  Administered 2019-06-17 – 2019-06-18 (×2): 10 mg via ORAL
  Filled 2019-06-17: qty 1

## 2019-06-17 NOTE — ED Triage Notes (Signed)
Pt appears anxious, states he lost his taste ever since he left the hospital. Pt attempting to spit in triage after coughing and states can't because he his mouth is dry. 4 weeks ago he states was the last time he was here

## 2019-06-17 NOTE — H&P (Addendum)
History and Physical    Larry Callahan XVQ:[id number] DOB: 1955/09/20 DOA: 06/17/2019  PCP: [name], NP  Patient coming from: Home  I have personally briefly reviewed patient's old medical records in [place]  Chief Complaint: Right hip pain  HPI: Larry Callahan is a 64 y.o. male with medical history significant of coronary artery disease, type 2 diabetes, hypertension, hyperlipidemia, COPD, obstructive sleep apnea who presented to the ED today due to acute onset of right hip pain after sustaining a fall at home.  Patient reports that it felt like his legs just gave out on him, causing him to fall on his right hip.  Has been unable to ambulate or bear weight since then.  He does state that he has little to no sensation in his feet, says it is almost like his feet can feel the floor.  He is unable to tell me if any prior history of neuropathy but does have diabetes.  He denies any prior dizziness, lightheadedness, chest pain or other symptoms prior to falling.  Patient was quite tangential during my encounter, frequently brings up prior admissions or chronic problems.  He does report shortness of breath which he thinks is related to anxiety.  Reported an episode of hemoptysis earlier today.  He has a history of this and has been worked up thoroughly without a cause identified.  He does report coughing up yellow phlegm.  Denies wheezing or having to use inhalers more frequently at home.  Denies any fevers or chills, nausea vomiting or diarrhea, headache, congestion or known sick contacts.  ED Course: Vitals within normal, labs notable for potassium 3.1, BUN 60, creatinine 2.16, anion gap 17, mild anemia with hemoglobin 12.1, UA negative.  CTs of the hip and lumbar spine were negative for acute fractures or dislocations.  Chest x-ray negative.  Patient attempted ambulation in the ED with nurse and using a walker, and only tolerated about 2 steps before having to sit down  due to the pain.    Patient admitted for observation and PT evaluation in the setting of inability to ambulate due to right hip pain status post fall.  Review of Systems: As per HPI otherwise 10 point review of systems negative.    Past Medical History:  Diagnosis Date   Allergy    Coronary artery disease    Diabetes mellitus without complication (HCC)    Diastolic CHF ([place])    GERD (gastroesophageal reflux disease)    Hyperlipidemia    Hypertension    Renal insufficiency    Sleep apnea     Past Surgical History:  Procedure Laterality Date   BACK SURGERY     CHOLECYSTECTOMY     COLONOSCOPY WITH PROPOFOL N/A 04/12/2019   Procedure: COLONOSCOPY WITH PROPOFOL;  Surgeon: [name], MD;  Location: [name] ENDOSCOPY;  Service: Gastroenterology;  Laterality: N/A;   ESOPHAGOGASTRODUODENOSCOPY N/A 04/12/2019   Procedure: ESOPHAGOGASTRODUODENOSCOPY (EGD);  Surgeon: [name], MD;  Location: [facility] ENDOSCOPY;  Service: Gastroenterology;  Laterality: N/A;   FLEXIBLE BRONCHOSCOPY Bilateral 05/17/2019   Procedure: FLEXIBLE BRONCHOSCOPY;  Surgeon: [name], MD;  Location: [name] ORS;  Service: Pulmonary;  Laterality: Bilateral;   left arm surgery     nephrectomy Left    PARATHYROIDECTOMY     RIGHT HEART CATH N/A 03/29/2019   Procedure: RIGHT HEART CATH;  Surgeon: [name], MD;  Location: [facility];  Service: Cardiovascular;  Laterality: N/A;     reports that  he has quit smoking. He has quit using smokeless tobacco.  His smokeless tobacco use included chew. He reports that he does not drink alcohol or use drugs.  Allergies  Allergen Reactions   Penicillins Rash    Family History  Problem Relation Age of Onset   Diabetes Mother    Lung cancer Father    Diabetes Sister    Hypertension Sister    Heart disease Sister    Cancer Sister    Bone cancer Brother      Prior to Admission medications   Medication Sig Start  Date End Date Taking? Authorizing Provider  acetaminophen (TYLENOL) 325 MG tablet Take 2 tablets (650 mg total) by mouth every 4 (four) hours as needed for headache or mild pain. 03/05/19   Gouru, Illene Silver, MD  albuterol (VENTOLIN HFA) 108 (90 Base) MCG/ACT inhaler Inhale 2 puffs into the lungs every 6 (six) hours as needed for wheezing or shortness of breath. 03/05/19   Gouru, Illene Silver, MD  amLODipine (NORVASC) 10 MG tablet Take 1 tablet (10 mg total) by mouth daily. 03/12/19   [name], MD  Blood Glucose Calibration (TRUE METRIX LEVEL 1) Low SOLN Use as directed 04/04/18   [name], NP  Blood Glucose Monitoring Suppl (TRUE METRIX AIR GLUCOSE METER) DEVI 1 Device by Does not apply route 2 (two) times daily. 04/13/18   [name], NP  ferrous gluconate (FERGON) 324 MG tablet Take 1 tablet (324 mg total) by mouth daily with breakfast. 12/31/18   [name], NP  Fluticasone-Salmeterol (ADVAIR DISKUS) 250-50 MCG/DOSE AEPB Inhale 1 puff into the lungs 2 (two) times daily. 03/05/19 03/04/20  Nicholes Mango, MD  furosemide (LASIX) 80 MG tablet Take 1 tablet (80 mg total) by mouth 2 (two) times daily. 05/24/19   Loletha Grayer, MD  gemfibrozil (LOPID) 600 MG tablet Take 1 tablet (600 mg total) by mouth 2 (two) times daily with a meal. 02/28/19   [name], NP  glimepiride (AMARYL) 2 MG tablet Take 1 tablet (2 mg total) by mouth daily before breakfast. 03/13/19   [name], NP  glucose blood (TRUE METRIX BLOOD GLUCOSE TEST) test strip Use as instructed twice daily diag E11.65 05/15/19   [name], NP  hydrALAZINE (APRESOLINE) 100 MG tablet Take 1 tablet (100 mg total) by mouth 3 (three) times daily. 06/13/19   [name], NP  Insulin Glargine, 1 Unit Dial, (TOUJEO SOLOSTAR) 300 UNIT/ML SOPN Inject 26 Units into the skin every evening. 03/13/19 06/11/19  [name], NP  ipratropium-albuterol (DUONEB) 0.5-2.5 (3) MG/3ML SOLN Take 3 mLs by nebulization every 6  (six) hours as needed. 03/08/19   Kendell Bane, NP  isosorbide mononitrate (IMDUR) 30 MG 24 hr tablet Take 1 tablet (30 mg total) by mouth 2 (two) times daily. 04/13/19   Hillary Bow, MD  loratadine (CLARITIN) 10 MG tablet Take 1 tablet (10 mg total) by mouth daily. 04/01/19   Nicholes Mango, MD  metoprolol succinate (TOPROL-XL) 50 MG 24 hr tablet Take 1 tablet (50 mg total) by mouth at bedtime. Take with or immediately following a meal. 03/12/19   Gollan, Kathlene November, MD  nitroGLYCERIN (NITROSTAT) 0.4 MG SL tablet Place 1 tablet (0.4 mg total) under the tongue every 5 (five) minutes x 3 doses as needed for chest pain. 05/24/19   Loletha Grayer, MD  NOVOFINE 32G X 6 MM MISC To use with [place] injections daily 12/13/18   [name],  NP  omeprazole (PRILOSEC) 40 MG capsule Take 1 capsule (40 mg total) by mouth daily. 03/01/19   Scarboro, Audie Clear, NP  potassium chloride SA (KLOR-CON) 20 MEQ tablet Take 1 tablet (20 mEq total) by mouth daily. 05/24/19   Loletha Grayer, MD  tiotropium (SPIRIVA HANDIHALER) 18 MCG inhalation capsule Place 1 capsule (18 mcg total) into inhaler and inhale daily. 05/24/19 06/23/19  Loletha Grayer, MD  TRUEPLUS LANCETS 33G MISC Use as directed twice daily diag E11.65 04/04/18   [name], NP    Physical Exam: Vitals:   06/17/19 1057 06/17/19 1730  BP: (!) 142/68 (!) 146/84  Pulse: 92 76  Resp: 20   Temp: 98.4 F (36.9 C)   TempSrc: Oral   SpO2: 97% 98%    Constitutional: NAD, calm, comfortable, obese Eyes: EOMI, lids and conjunctivae normal ENMT: Mucous membranes are moist.  Hearing is grossly normal. Respiratory: Decreased breath sounds bilaterally, no wheezing, no crackles. Normal respiratory effort. No accessory muscle use.  Cardiovascular: Regular rate and rhythm, no murmurs / rubs / gallops. No extremity edema.  Abdomen: no tenderness, Bowel sounds positive.  Musculoskeletal: Tenderness on palpation of the right SI joint, no bruising or  induration.  No clubbing / cyanosis. Normal muscle tone.  Skin: Dry, intact, normal temp Neurologic: CN 2-12 grossly intact.  Bilateral lower extremity subjective sensory deficit on light palpation.  Strength 5/5 in all 4.  Normal speech Psychiatric: Alert and oriented x 3.  Mildly anxious mood.  Congruent affect.  Unclear if judgment and insight are normal.  Tangential thought content.  Labs on Admission: I have personally reviewed following labs and imaging studies  CBC: Recent Labs  Lab 06/17/19 1305  WBC 9.0  NEUTROABS 6.7  HGB 12.1*  HCT 36.7*  MCV 85.2  PLT 630   Basic Metabolic Panel: Recent Labs  Lab 06/17/19 1305  NA 137  K 3.1*  CL 100  CO2 20*  GLUCOSE 158*  BUN 60*  CREATININE 2.16*  CALCIUM 9.4   GFR: CrCl cannot be calculated (Unknown ideal weight.). Liver Function Tests: Recent Labs  Lab 06/17/19 1305  AST 22  ALT 15  ALKPHOS 82  BILITOT 0.8  PROT 7.0  ALBUMIN 3.3*   Recent Labs  Lab 06/17/19 1305  LIPASE 32   No results for input(s): AMMONIA in the last 168 hours. Coagulation Profile: Recent Labs  Lab 06/17/19 1305  INR 1.0   Cardiac Enzymes: No results for input(s): CKTOTAL, CKMB, CKMBINDEX, TROPONINI in the last 168 hours. BNP (last 3 results) No results for input(s): PROBNP in the last 8760 hours. HbA1C: No results for input(s): HGBA1C in the last 72 hours. CBG: No results for input(s): GLUCAP in the last 168 hours. Lipid Profile: No results for input(s): CHOL, HDL, LDLCALC, TRIG, CHOLHDL, LDLDIRECT in the last 72 hours. Thyroid Function Tests: No results for input(s): TSH, T4TOTAL, FREET4, T3FREE, THYROIDAB in the last 72 hours. Anemia Panel: No results for input(s): VITAMINB12, FOLATE, FERRITIN, TIBC, IRON, RETICCTPCT in the last 72 hours. Urine analysis:    Component Value Date/Time   COLORURINE YELLOW (A) 06/17/2019 1306   APPEARANCEUR CLEAR (A) 06/17/2019 1306   APPEARANCEUR Cloudy (A) 11/23/2018 1103   LABSPEC 1.010  06/17/2019 1306   LABSPEC 1.005 03/06/2013 0929   PHURINE 5.0 06/17/2019 1306   GLUCOSEU NEGATIVE 06/17/2019 1306   GLUCOSEU Negative 03/06/2013 0929   HGBUR NEGATIVE 06/17/2019 1306   BILIRUBINUR NEGATIVE 06/17/2019 1306   BILIRUBINUR negative 02/20/2019 1404  BILIRUBINUR Negative 11/23/2018 1103   BILIRUBINUR Negative 03/06/2013 0929   KETONESUR NEGATIVE 06/17/2019 1306   PROTEINUR >=300 (A) 06/17/2019 1306   UROBILINOGEN 0.2 02/20/2019 1404   NITRITE NEGATIVE 06/17/2019 1306   LEUKOCYTESUR NEGATIVE 06/17/2019 1306   LEUKOCYTESUR Negative 03/06/2013 0929    Radiological Exams on Admission: DG Chest 2 View  Result Date: 06/17/2019 CLINICAL DATA:  64 year old male with fall, cough, hemoptysis. EXAM: CHEST - 2 VIEW COMPARISON:  Chest radiographs 05/19/2019 and earlier. FINDINGS: Seated AP and lateral views of the chest. Patchy and asymmetric lower lung opacity suspected last month has resolved. No pneumothorax, pulmonary edema, pleural effusion or confluent pulmonary opacity. Mediastinal contours are stable and within normal limits. Visualized tracheal air column is within normal limits. Stable visualized osseous structures. Paucity of bowel gas in the upper abdomen. IMPRESSION: No acute cardiopulmonary abnormality. Electronically Signed   By: Genevie Ann M.D.   On: 06/17/2019 13:23   CT Lumbar Spine Wo Contrast  Result Date: 06/17/2019 CLINICAL DATA:  Fall.  Back pain EXAM: CT LUMBAR SPINE WITHOUT CONTRAST TECHNIQUE: Multidetector CT imaging of the lumbar spine was performed without intravenous contrast administration. Multiplanar CT image reconstructions were also generated. COMPARISON:  Lumbar spine radiographs 05/19/2019 FINDINGS: Segmentation: Normal Alignment: Normal Vertebrae: Negative for fracture Paraspinal and other soft tissues: Large right renal cyst, incompletely evaluated. 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 Incomplete  evaluation. Atherosclerotic aorta, incompletely evaluated. Atherosclerotic calcification in the iliac arteries bilaterally. Prostate enlargement. No enlarged retroperitoneal lymph nodes identified. Disc levels: L1-2: Mild degenerative change without stenosis L2-3: Mild disc bulging. Bilateral facet degeneration with mild spinal stenosis L3-4: Disc bulging and mild facet degeneration. Mild spinal stenosis L4-5: Diffuse disc bulging. Bilateral facet degeneration with mild spinal stenosis. L5-S1: Moderate facet degeneration bilaterally. Negative for stenosis. IMPRESSION: Negative for lumbar fracture. 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 Electronically Signed   By: Franchot Gallo M.D.   On: 06/17/2019 13:51   CT Hip Right Wo Contrast  Result Date: 06/17/2019 CLINICAL DATA:  Right hip pain after fall EXAM: CT OF THE RIGHT HIP WITHOUT CONTRAST TECHNIQUE: Multidetector CT imaging of the right hip was performed according to the standard protocol. Multiplanar CT image reconstructions were also generated. COMPARISON:  X-ray 06/17/2019, CT 04/24/2019 FINDINGS: Bones/Joint/Cartilage No acute fracture. Right hip joint is intact without dislocation. 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 Unchanged appearance of slightly fragmented osteophyte at the posterior acetabulum (series 12, image 39; series 6, image 39), compared to prior CT. Right SI joint and pubic symphysis are intact without diastasis. No lytic or sclerotic osseous lesion. No appreciable hip joint effusion. Ligaments Suboptimally assessed by CT. Muscles and Tendons Preservation of the muscle bulk. No obvious muscular or tendinous abnormality within the limitations of CT. Soft tissues No soft tissue fluid collection or hematoma. There scattered atherosclerotic  calcifications. Scattered colonic diverticulosis. No acute findings within the visualized portion of the pelvis. IMPRESSION: 1. No acute fracture or dislocation of the right hip joint. 2. 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 Electronically Signed   By: Davina Poke D.O.   On: 06/17/2019 13:49   DG Hip Unilat  With Pelvis 2-3 Views Right  Result Date: 06/17/2019 CLINICAL DATA:  Pt states right hip pain after his legs gave out this am and he  fell, states he can't walk at this time since fall/due to pain. EXAM: DG HIP (WITH OR WITHOUT PELVIS) 2-3V RIGHT COMPARISON:  Radiograph 05/19/2019 FINDINGS: Hips are located.  No pelvic fracture. Dedicated view of the RIGHT hip demonstrates a ring of osteophytes at the around the femoral neck not changed from comparison exam. No evidence of acute fracture dislocation. IMPRESSION: 1. Ring of osteophytes around the RIGHT femoral neck similar to comparison radiograph. 2. Severe joint space narrowing at the RIGHT hip joint. Osteoarthritis of the hip joints, RIGHT greater than LEFT. 3. No acute fracture dislocation. Electronically Signed   By: Suzy Bouchard M.D.   On: 06/17/2019 12:34     Assessment/Plan Principal Problem:   Acute right hip pain Active Problems:   Inability to ambulate due to hip   Type 2 diabetes mellitus with diabetic polyneuropathy (HCC)   Essential hypertension   Hemoptysis   CKD stage 3 due to type 2 diabetes mellitus ([place])   Atherosclerotic heart disease of native coronary artery without angina pectoris   Mixed hyperlipidemia   COPD with acute exacerbation (HCC)   Acute right hip pain Inability to ambulate due to hip Suspect the patient's falls secondary to peripheral neuropathy versus purely mechanical fall.  Patient unable to tolerate more than 2 steps with use of walker during evaluation in the ED, prompting admission for observation. --PT evaluation pending -Fall precautions   Type 2 diabetes mellitus with diabetic  polyneuropathy ([place])  -  --Continue home regimen of glimepiride and Lantus 26 units at bedtime --will also add sliding scale NovoLog  Essential hypertension -  -Continue home medications: Amlodipine, Lasix, hydralazine, Toprol  Hemoptysis - this is a chronic intermittent problem and by history sounds like scant amounts of blood on occasion.  This has been fully evaluated during a prior recent admission including negative NM perfusion/ventilation scan, EGD, colonoscopy, flex laryngoscopy, and flex bronchoscopy, all of which were unrevealing as to the cause. --No need for work-up or intervention at this time -Monitor closely for worsening or massive hemoptysis --CBC daily  COPD -currently with possible mild acute exacerbation.   No wheezing on exam or increased use of inhaler reported by patient, however he does report recent onset of thick yellow sputum production.  Will hold off any steroids since he is not wheezing but  will treat with azithromycin for possible bronchitis. --Continue home inhaler regimen --Monitor respiratory status  CKD stage 3 due to type 2 diabetes mellitus (HCC) -stable --Monitor renal function --Renally dose meds as indicated and avoid nephrotoxic meds  Coronary artery disease -stable without active chest pain upon admission -Continue home medications: Toprol, Imdur, hydralazine, Lasix, sublingual nitro as needed  Mixed hyperlipidemia --Appears not to be on statin -Continue home gemfibrozil   DVT prophylaxis: Lovenox  Code Status: Full  Family Communication: None at bedside  Disposition Plan: Expect discharge home in 24 to 48 hours pending PT evaluation.  Patient currently unable to ambulate. Consults called: None  Admission status: Obs   Ezekiel Slocumb, DO [facility]   If 7PM-7AM, please contact night-coverage [email or web address] Password [facility]  06/17/2019, 6:03 PM

## 2019-06-17 NOTE — ED Notes (Signed)
Attempted to have patient ambulate with walker. Patient moved himself to side of bed and to sit on side of bed, stood with walker and would take 1 step before having to sit back down due to right hip pain. Patient very anxious

## 2019-06-17 NOTE — ED Notes (Signed)
Patient given diet coke upon request

## 2019-06-17 NOTE — ED Triage Notes (Signed)
Pt states right hip pain after his legs gave out this am and he fell, states he can't walk at this time since fall/due to pain. Also states he "is coughing up blood again, happened one other time before." Denies known covid exposure. Sats 97% on RA. NAD.

## 2019-06-17 NOTE — Telephone Encounter (Signed)
Patient called and very weak and falling and upset stomach, since pt fell he has been advised to call EMT [id number]. Beth

## 2019-06-17 NOTE — ED Provider Notes (Signed)
[facility] Emergency Department Provider Note  ____________________________________________   First MD Initiated Contact with Patient 06/17/19 1235     (approximate)  I have reviewed the triage vital signs and the nursing notes.  History  Chief Complaint Hip Pain    HPI Larry Callahan is a 64 y.o. male with history of DM, CAD, HF, CKD who presents for multiple complaints.   First, he complains of a fall with R hip pain. He states he was walking earlier today when he felt like his legs gave out from under him and he fell onto his right hip.  He has had difficulty walking since then due to R hip pain.  He denies any head injury or loss of consciousness.  He denies any pain elsewhere.  Pain is sharp, located to the right hip and lower back area.  Pain is constant, 8/10 in severity, no alleviating or aggravating factors, no radiation. He does complain of numbness/tingling to his toes/feet bilaterally, he is unsure if he has a hx of diabetic neuropathy. This is bilateral. No unilateral neurological symptoms.   Patient also reports small volume hemoptysis this morning. This morning he coughed and noticed some blood streaked sputum, as well as once in the ER. Sputum is yellow in color. Not large volume. Patient has a history of the same, was recently admitted in November/December, with complete work-up including negative NM perfusion/ventilation scan, upper endoscopy, colonoscopy, flexible laryngoscopy, and flexible bronchoscopy all of which were unrevealing. Patient is not on any blood thinning medications.  He denies any fevers, chills, sick contacts, vomiting.  Did have an episode of loose stool earlier today.  Denies any known COVID exposures.  History somewhat limited, patient is a difficult historian.    Past Medical Hx Past Medical History:  Diagnosis Date  . Allergy   . Coronary artery disease   . Diabetes mellitus without complication ([place])   .  Diastolic CHF ([place])   . GERD (gastroesophageal reflux disease)   . Hyperlipidemia   . Hypertension   . Renal insufficiency   . Sleep apnea     Problem List Patient Active Problem List   Diagnosis Date Noted  . CKD stage 3 due to type 2 diabetes mellitus ([place])   . Hypervolemia   . Acute on chronic diastolic CHF (congestive heart failure) ([place])   . Lobar pneumonia ([place])   . Type 2 diabetes mellitus with stage 3b chronic kidney disease, with long-term current use of insulin ([place])   . Hypokalemia   . Hypomagnesemia   . Full code status 05/20/2019  . Pleuritic chest pain   . CAP (community acquired pneumonia) 05/19/2019  . AKI (acute kidney injury) ([place]) 05/09/2019  . Hemoptysis 04/09/2019  . Shortness of breath 03/26/2019  . Uncontrolled type 2 diabetes mellitus with insulin therapy ([place]) 03/12/2019  . Acute on chronic heart failure with preserved ejection fraction (HFpEF) ([place])   . Acute kidney injury superimposed on chronic kidney disease ([place])   . CHF (congestive heart failure) ([place]) 03/01/2019  . Diastolic dysfunction 67/67/2094  . Iron deficiency anemia 12/31/2018  . Atopic dermatitis 11/24/2018  . Screening for colon cancer 11/06/2017  . Uncontrolled type 2 diabetes mellitus with hyperglycemia ([place]) 11/06/2017  . Dermatitis, unspecified 06/09/2017  . Atherosclerotic heart disease of native coronary artery without angina pectoris 06/09/2017  . 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 Obstructive sleep apnea, adult 06/09/2017  . Morbid (severe) obesity due to excess calories (  [place]) 06/09/2017  . Cigarette nicotine dependence with nicotine-induced disorder 06/09/2017  . Type 2 diabetes mellitus with diabetic polyneuropathy ([place]) 06/09/2017  . Allergic rhinitis due to pollen 06/09/2017  . Mixed hyperlipidemia 06/09/2017  . COPD with acute exacerbation ([place]) 06/09/2017  . Dyspnea on exertion 06/09/2017  . Wheezing 06/09/2017  . Dysuria  06/09/2017  . Snoring 06/09/2017  . Essential hypertension 06/09/2017  . Personal history of other malignant neoplasm of kidney 06/09/2017  . Pain in right hip 06/09/2017  . Impacted cerumen, bilateral 06/09/2017  . Secondary hyperparathyroidism, not elsewhere classified ([place]) 06/09/2017  . Type 2 diabetes mellitus with hyperglycemia ([place]) 06/09/2017  . Tinea corporis 06/09/2017    Past Surgical Hx Past Surgical History:  Procedure Laterality Date  . BACK SURGERY    . CHOLECYSTECTOMY    . COLONOSCOPY WITH PROPOFOL N/A 04/12/2019   Procedure: COLONOSCOPY WITH PROPOFOL;  Surgeon: [name], MD;  Location: [facility] ENDOSCOPY;  Service: Gastroenterology;  Laterality: N/A;  . ESOPHAGOGASTRODUODENOSCOPY N/A 04/12/2019   Procedure: ESOPHAGOGASTRODUODENOSCOPY (EGD);  Surgeon: [name], MD;  Location: [facility] ENDOSCOPY;  Service: Gastroenterology;  Laterality: N/A;  . FLEXIBLE BRONCHOSCOPY Bilateral 05/17/2019   Procedure: FLEXIBLE BRONCHOSCOPY;  Surgeon: [name], MD;  Location: [name] ORS;  Service: Pulmonary;  Laterality: Bilateral;  . left arm surgery    . nephrectomy Left   . PARATHYROIDECTOMY    . RIGHT HEART CATH N/A 03/29/2019   Procedure: RIGHT HEART CATH;  Surgeon: [name], MD;  Location: [place] CV LAB;  Service: Cardiovascular;  Laterality: N/A;    Medications Prior to Admission medications   Medication Sig Start Date End Date Taking? Authorizing Provider  acetaminophen (TYLENOL) 325 MG tablet Take 2 tablets (650 mg total) by mouth every 4 (four) hours as needed for headache or mild pain. 03/05/19   Gouru, Illene Silver, MD  albuterol (VENTOLIN HFA) 108 (90 Base) MCG/ACT inhaler Inhale 2 puffs into the lungs every 6 (six) hours as needed for wheezing or shortness of breath. 03/05/19   Gouru, Illene Silver, MD  amLODipine (NORVASC) 10 MG tablet Take 1 tablet (10 mg total) by mouth daily. 03/12/19   [name], MD  Blood Glucose Calibration (TRUE METRIX LEVEL  1) Low SOLN Use as directed 04/04/18   [name], NP  Blood Glucose Monitoring Suppl (TRUE METRIX AIR GLUCOSE METER) DEVI 1 Device by Does not apply route 2 (two) times daily. 04/13/18   [name], NP  cefdinir (OMNICEF) 300 MG capsule Take 1 capsule (300 mg total) by mouth every 12 (twelve) hours. 05/24/19   Loletha Grayer, MD  ferrous gluconate (FERGON) 324 MG tablet Take 1 tablet (324 mg total) by mouth daily with breakfast. 12/31/18   [name], NP  Fluticasone-Salmeterol (ADVAIR DISKUS) 250-50 MCG/DOSE AEPB Inhale 1 puff into the lungs 2 (two) times daily. 03/05/19 03/04/20  Nicholes Mango, MD  furosemide (LASIX) 80 MG tablet Take 1 tablet (80 mg total) by mouth 2 (two) times daily. 05/24/19   Loletha Grayer, MD  gemfibrozil (LOPID) 600 MG tablet Take 1 tablet (600 mg total) by mouth 2 (two) times daily with a meal. 02/28/19   [name], NP  glimepiride (AMARYL) 2 MG tablet Take 1 tablet (2 mg total) by mouth daily before breakfast. 03/13/19   [name], NP  glucose blood (TRUE METRIX BLOOD GLUCOSE TEST) test strip Use as instructed twice daily diag E11.65 05/15/19   [name], NP  hydrALAZINE (APRESOLINE) 100  MG tablet Take 1 tablet (100 mg total) by mouth 3 (three) times daily. 06/13/19   [name], NP  Insulin Glargine, 1 Unit Dial, (TOUJEO SOLOSTAR) 300 UNIT/ML SOPN Inject 26 Units into the skin every evening. 03/13/19 06/11/19  [name], NP  ipratropium-albuterol (DUONEB) 0.5-2.5 (3) MG/3ML SOLN Take 3 mLs by nebulization every 6 (six) hours as needed. 03/08/19   Kendell Bane, NP  isosorbide mononitrate (IMDUR) 30 MG 24 hr tablet Take 1 tablet (30 mg total) by mouth 2 (two) times daily. 04/13/19   Hillary Bow, MD  loratadine (CLARITIN) 10 MG tablet Take 1 tablet (10 mg total) by mouth daily. 04/01/19   Nicholes Mango, MD  metoprolol succinate (TOPROL-XL) 50 MG 24 hr tablet Take 1 tablet (50 mg total) by mouth at bedtime. Take with  or immediately following a meal. 03/12/19   Gollan, Kathlene November, MD  nitroGLYCERIN (NITROSTAT) 0.4 MG SL tablet Place 1 tablet (0.4 mg total) under the tongue every 5 (five) minutes x 3 doses as needed for chest pain. 05/24/19   Loletha Grayer, MD  NOVOFINE 32G X 6 MM MISC To use with [place] injections daily 12/13/18   [name], NP  omeprazole (PRILOSEC) 40 MG capsule Take 1 capsule (40 mg total) by mouth daily. 03/01/19   [place], Audie Clear, NP  potassium chloride SA (KLOR-CON) 20 MEQ tablet Take 1 tablet (20 mEq total) by mouth daily. 05/24/19   Loletha Grayer, MD  tiotropium (SPIRIVA HANDIHALER) 18 MCG inhalation capsule Place 1 capsule (18 mcg total) into inhaler and inhale daily. 05/24/19 06/23/19  Loletha Grayer, MD  TRUEPLUS LANCETS 33G MISC Use as directed twice daily diag E11.65 04/04/18   [name], NP    Allergies Penicillins  Family Hx Family History  Problem Relation Age of Onset  . Diabetes Mother   . Lung cancer Father   . Diabetes Sister   . Hypertension Sister   . Heart disease Sister   . Cancer Sister   . Bone cancer Brother     Social Hx Social History   Tobacco Use  . Smoking status: Former Research scientist (life sciences)  . Smokeless tobacco: Former Systems developer    Types: Chew  Substance Use Topics  . Alcohol use: No  . Drug use: No     Review of Systems  Constitutional: Negative for fever, chills. Eyes: Negative for visual changes. ENT: Negative for sore throat. Cardiovascular: Negative for chest pain. Respiratory: + hemoptysis Gastrointestinal: Negative for nausea, vomiting.  Genitourinary: Negative for dysuria. Musculoskeletal: + RIGHT hip pain, lower back pain Skin: Negative for rash. Neurological: Negative for for headaches.   Physical Exam  Vital Signs: ED Triage Vitals  Enc Vitals Group     BP 06/17/19 1057 (!) 142/68     Pulse Rate 06/17/19 1057 92     Resp 06/17/19 1057 20     Temp 06/17/19 1057 98.4 F (36.9 C)     Temp Source 06/17/19 1057 Oral      SpO2 06/17/19 1057 97 %     Weight --      Height --      Head Circumference --      Peak Flow --      Pain Score 06/17/19 1103 8     Pain Loc --      Pain Edu? --      Excl. in [place]? --     Constitutional: Alert and oriented. Chronically, but not acutely ill appearing.  Head:  Normocephalic. Atraumatic. Eyes: Conjunctivae clear. Sclera anicteric. Nose: No congestion. No rhinorrhea. Mouth/Throat: Wearing mask.  Neck: No stridor.   Cardiovascular: Normal rate, regular rhythm. Extremities well perfused. Respiratory: Normal respiratory effort.  Lungs CTAB. Gastrointestinal: Soft. Non-tender. Non-distended.  Musculoskeletal: TTP about the RIGHT lateral hip and along the iliac crest on RIGHT. Mid/lower lumbar spinal tenderness. No step off or deformities.  Neurologic:  Normal speech and language. No gross focal neurologic deficits are appreciated.  Skin: Skin is warm, dry and intact. No rash noted. Psychiatric: Mood and affect are appropriate for situation.   Radiology  XR RIGHT hip: IMPRESSION:  1. Ring of osteophytes around the RIGHT femoral neck similar to  comparison radiograph.  2. Severe joint space narrowing at the RIGHT hip joint.  Osteoarthritis of the hip joints, RIGHT greater than LEFT.  3. No acute fracture dislocation.   CT hip:  IMPRESSION:  1. No acute fracture or dislocation of the right hip joint.  2. 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   CT L spine: IMPRESSION:  Negative for lumbar fracture.  Multilevel degenerative changes above  Left nephrectomy. Enlarging cyst in the region of left adrenal gland  compared with 2014.   CXR: IMPRESSION:  No acute cardiopulmonary abnormality.    Procedures  Procedure(s) performed (including critical care):  Procedures   Initial Impression / Assessment and Plan / ED Course  64 y.o. male who presents to the ED for multiple complaints, as above. Namely fall with RIGHT hip and lower back pain. As well as  acute on chronic hemoptysis.   Imaging, including CT hip and L spine, negative for fracture. Labs near baseline. Will treat cough as COPD exacerbation w/ doxycyline given increased color/sputum production. Unfortunately despite pain control + ambulatory trial with assistance + walker, he is unable to take more than 2 steps without stopping due to pain. Patient lives alone w/o any aid and has set of stairs in his home. Will plan to admit for pain control, PT/OT. Patient agreeable. Discussed w/ hospitalist for admission.   Final Clinical Impression(s) / ED Diagnosis  Final diagnoses:  Fall, initial encounter  Right hip pain  Hemoptysis       Note:  This document was prepared using Dragon voice recognition software and may include unintentional dictation errors.   Lilia Pro., MD 06/17/19 2012

## 2019-06-18 ENCOUNTER — Encounter: Payer: Self-pay | Admitting: Internal Medicine

## 2019-06-18 DIAGNOSIS — M25551 Pain in right hip: Secondary | ICD-10-CM | POA: Diagnosis not present

## 2019-06-18 LAB — CBC
HCT: 34.4 % — ABNORMAL LOW (ref 39.0–52.0)
Hemoglobin: 11.1 g/dL — ABNORMAL LOW (ref 13.0–17.0)
MCH: 28 pg (ref 26.0–34.0)
MCHC: 32.3 g/dL (ref 30.0–36.0)
MCV: 86.6 fL (ref 80.0–100.0)
Platelets: 219 10*3/uL (ref 150–400)
RBC: 3.97 MIL/uL — ABNORMAL LOW (ref 4.22–5.81)
RDW: 14.7 % (ref 11.5–15.5)
WBC: 6.8 10*3/uL (ref 4.0–10.5)
nRBC: 0 % (ref 0.0–0.2)

## 2019-06-18 LAB — GLUCOSE, CAPILLARY
Glucose-Capillary: 118 mg/dL — ABNORMAL HIGH (ref 70–99)
Glucose-Capillary: 116 mg/dL — ABNORMAL HIGH (ref 70–99)
Glucose-Capillary: 227 mg/dL — ABNORMAL HIGH (ref 70–99)

## 2019-06-18 LAB — BASIC METABOLIC PANEL
Anion gap: 11 (ref 5–15)
BUN: 65 mg/dL — ABNORMAL HIGH (ref 8–23)
CO2: 24 mmol/L (ref 22–32)
Calcium: 9 mg/dL (ref 8.9–10.3)
Chloride: 102 mmol/L (ref 98–111)
Creatinine, Ser: 2.3 mg/dL — ABNORMAL HIGH (ref 0.61–1.24)
GFR calc Af Amer: 33 mL/min — ABNORMAL LOW (ref >60)
GFR calc non Af Amer: 29 mL/min — ABNORMAL LOW (ref >60)
Glucose, Bld: 118 mg/dL — ABNORMAL HIGH (ref 70–99)
Potassium: 3 mmol/L — ABNORMAL LOW (ref 3.5–5.1)
Sodium: 137 mmol/L (ref 135–145)

## 2019-06-18 LAB — SARS CORONAVIRUS 2 (TAT 6-24 HRS): SARS Coronavirus 2: NEGATIVE

## 2019-06-18 MED ORDER — METHOCARBAMOL 500 MG PO TABS
500.0000 mg | ORAL_TABLET | Freq: Three times a day (TID) | ORAL | Status: DC
  Administered 2019-06-18: 500 mg via ORAL
  Filled 2019-06-18: qty 1

## 2019-06-18 MED ORDER — AZITHROMYCIN 500 MG PO TABS: 500.0000 mg | ORAL_TABLET | Freq: Every day | ORAL | 0 refills | Status: DC

## 2019-06-18 MED ORDER — AZITHROMYCIN 500 MG PO TABS
500.0000 mg | ORAL_TABLET | Freq: Every day | ORAL | Status: DC
  Administered 2019-06-18: 500 mg via ORAL
  Filled 2019-06-18: qty 1

## 2019-06-18 MED ORDER — HYDRALAZINE HCL 50 MG PO TABS: 50.0000 mg | ORAL_TABLET | Freq: Three times a day (TID) | ORAL | Status: DC

## 2019-06-18 MED ORDER — OXYCODONE HCL 5 MG PO TABS: 5.0000 mg | ORAL_TABLET | ORAL | Status: DC | PRN

## 2019-06-18 MED ORDER — KETOROLAC TROMETHAMINE 30 MG/ML IJ SOLN
15.0000 mg | Freq: Four times a day (QID) | INTRAMUSCULAR | Status: DC
  Administered 2019-06-18: 15 mg via INTRAVENOUS
  Filled 2019-06-18: qty 1

## 2019-06-18 MED ORDER — SALINE SPRAY 0.65 % NA SOLN: 1.0000 | NASAL | 0 refills | Status: DC | PRN

## 2019-06-18 MED ORDER — METHOCARBAMOL 500 MG PO TABS: 500.0000 mg | ORAL_TABLET | Freq: Three times a day (TID) | ORAL | 0 refills | Status: AC

## 2019-06-18 MED ORDER — POLYETHYLENE GLYCOL 3350 17 G PO PACK: 17.0000 g | PACK | Freq: Every day | ORAL | 0 refills | Status: DC

## 2019-06-18 MED ORDER — KETOROLAC TROMETHAMINE 30 MG/ML IJ SOLN: 30.0000 mg | Freq: Four times a day (QID) | INTRAMUSCULAR | Status: DC

## 2019-06-18 MED ORDER — GUAIFENESIN ER 600 MG PO TB12: 600.0000 mg | ORAL_TABLET | Freq: Two times a day (BID) | ORAL | 0 refills | Status: DC

## 2019-06-18 MED ORDER — OXYCODONE-ACETAMINOPHEN 5-325 MG PO TABS: 1.0000 | ORAL_TABLET | ORAL | 0 refills | Status: DC | PRN

## 2019-06-18 NOTE — TOC Transition Note (Signed)
Transition of Care [facility]) - CM/SW Discharge Note   Patient Details  Name: Larry Callahan MRN: [id number] Date of Birth: Aug 16, 1955  [facility] CM/SW Contact:  Su Hilt, RN Phone Number: 06/18/2019, 2:23 PM   Clinical Narrative:     Met with the patient to discuss [place] and needs He lives alone and his neighbor is giving him a new cane and walker he is set up with [facility] services, His sister is providing transportation  Final next level of care: [place] Barriers to Discharge: Barriers Resolved   Patient Goals and CMS Choice Patient states their goals for this hospitalization and ongoing recovery are:: go home      Discharge Placement             Home          Discharge Plan and Services   Discharge Planning Services: CM Consult            DME Arranged: N/A         HH Arranged: PT HH Agency: [facility] (formerly [employer]) Date [place]: 06/18/19 Time English: 1418 Representative spoke with at [place]: [place]) Interventions     Readmission Risk Interventions Readmission Risk Prevention Plan 05/10/2019 04/27/2019  Transportation Screening Complete Complete  Medication Review Press photographer) Complete Complete  PCP or Specialist appointment within 3-5 days of discharge - Complete  HRI or [place] - Complete  SW Recovery Care/Counseling Consult - Not Complete  SW Consult Not Complete Comments - not needed  Palliative Care Screening Not Applicable Not Applicable  Skilled Nursing Facility Not Applicable Not Applicable  Some recent data might be hidden

## 2019-06-18 NOTE — Discharge Summary (Signed)
[facility] Discharge Summary   Patient: Larry Callahan BDZ:[id number]   PCP: [name], NP DOB: 1955/10/11   Date of admission: 06/17/2019   Date of discharge: 06/18/2019     Discharge Diagnoses:   Principal Problem:   Acute right hip pain Active Problems:   Atherosclerotic heart disease of native coronary artery without angina pectoris   Type 2 diabetes mellitus with diabetic polyneuropathy ([place])   Mixed hyperlipidemia   COPD with acute exacerbation ([place])   Essential hypertension   Hemoptysis   CKD stage 3 due to type 2 diabetes mellitus ([place])   Inability to ambulate due to hip   Admitted From: [facility] Disposition:  Home with home health  Recommendations for Outpatient Follow-up:  1. PCP: Follow-up with PCP in 1 week 2. Follow up LABS/TEST: Check a BMP in 1 week  Follow-up Information    [name], NP On 07/02/2019.   Specialty: Family Medicine Why: check BMP. @10 :30am with follow up appointment  Contact information: [place] [address] [phone]          Diet recommendation: Cardiac diet  Activity: The patient is advised to gradually reintroduce usual activities,as tolerated  Discharge Condition: good  Code Status: Full code   History of present illness: As per the H and P dictated on admission, "Larry Callahan is a 64 y.o. male with medical history significant of coronary artery disease, type 2 diabetes, hypertension, hyperlipidemia, COPD, obstructive sleep apnea who presented to the ED today due to acute onset of right hip pain after sustaining a fall at home.  Patient reports that it felt like his legs just gave out on him, causing him to fall on his right hip.  Has been unable to ambulate or bear weight since then.  He does state that he has little to no sensation in his feet, says it is almost like his feet can feel the floor.  He is unable to tell me if any prior history of neuropathy but does have  diabetes.  He denies any prior dizziness, lightheadedness, chest pain or other symptoms prior to falling.  Patient was quite tangential during my encounter, frequently brings up prior admissions or chronic problems.  He does report shortness of breath which he thinks is related to anxiety.  Reported an episode of hemoptysis earlier today.  He has a history of this and has been worked up thoroughly without a cause identified.  He does report coughing up yellow phlegm.  Denies wheezing or having to use inhalers more frequently at home.  Denies any fevers or chills, nausea vomiting or diarrhea, headache, congestion or known sick contacts."  Hospital Course:  Summary of his active problems in the hospital is as following. Acute right hip pain Inability to ambulate due to hip Suspect the patient's falls secondary to peripheral neuropathy versus purely mechanical fall.  Patient unable to tolerate more than 2 steps with use of walker during evaluation in the ED, prompting admission for observation. --PT evaluation recommended home health  Type 2 diabetes mellitus with diabetic polyneuropathy ([place])  -  --Continue home regimen  Essential hypertension -  -Continue home medications: Amlodipine, Lasix, hydralazine, Toprol  Hemoptysis -  this is a chronic intermittent problem and by history sounds like scant amounts of blood on occasion.   This has been fully evaluated during a prior recent admission including negative NM perfusion/ventilation scan, EGD, colonoscopy, flex laryngoscopy, and flex bronchoscopy, all of which were unrevealing as to the  cause. --No need for work-up or intervention at this time  COPD -currently with possible mild acute exacerbation.   No wheezing on exam or increased use of inhaler reported by patient, however he does report recent onset of thick yellow sputum production.  Will hold off any steroids since he is not wheezing but  will treat with azithromycin for possible  bronchitis. --Continue home inhaler regimen  CKD stage 3 due to type 2 diabetes mellitus ([place]) -stable --Monitor renal function --Renally dose meds as indicated and avoid nephrotoxic meds  Coronary artery disease -stable without active chest pain upon admission -Continue home medications: Toprol, Imdur, hydralazine, Lasix, sublingual nitro as needed  Mixed hyperlipidemia --Appears not to be on statin -Continue home gemfibrozil  Pain control  - [place] Controlled Substance Reporting System database was reviewed. -  day supply was provided. - Patient was instructed, not to drive, operate heavy machinery, perform activities at heights, swimming or participation in water activities or provide baby sitting services while on Pain, Sleep and Anxiety Medications; until his outpatient Physician has advised to do so again.  - Also recommended to not to take more than prescribed Pain, Sleep and Anxiety Medications.  Patient was seen by physical therapy, who recommended Home health, which was arranged. On the day of the discharge the patient's vitals were stable, and no other acute medical condition were reported by patient. the patient was felt safe to be discharge at Home with Home health.  Consultants: none Procedures: none  DISCHARGE MEDICATION: Allergies as of 06/18/2019      Reactions   Penicillins Rash      Medication List    TAKE these medications   acetaminophen 325 MG tablet Commonly known as: TYLENOL Take 2 tablets (650 mg total) by mouth every 4 (four) hours as needed for headache or mild pain.   albuterol 108 (90 Base) MCG/ACT inhaler Commonly known as: VENTOLIN HFA Inhale 2 puffs into the lungs every 6 (six) hours as needed for wheezing or shortness of breath.   amLODipine 10 MG tablet Commonly known as: NORVASC Take 1 tablet (10 mg total) by mouth daily.   azithromycin 500 MG tablet Commonly known as: ZITHROMAX Take 1 tablet (500 mg total) by mouth  daily. Start taking on: June 19, 2019   ferrous gluconate 324 MG tablet Commonly known as: FERGON Take 1 tablet (324 mg total) by mouth daily with breakfast.   Fluticasone-Salmeterol 250-50 MCG/DOSE Aepb Commonly known as: Advair Diskus Inhale 1 puff into the lungs 2 (two) times daily.   furosemide 80 MG tablet Commonly known as: LASIX Take 1 tablet (80 mg total) by mouth 2 (two) times daily.   gemfibrozil 600 MG tablet Commonly known as: LOPID Take 1 tablet (600 mg total) by mouth 2 (two) times daily with a meal.   glimepiride 2 MG tablet Commonly known as: AMARYL Take 1 tablet (2 mg total) by mouth daily before breakfast.   guaiFENesin 600 MG 12 hr tablet Commonly known as: Mucinex Take 1 tablet (600 mg total) by mouth 2 (two) times daily.   hydrALAZINE 100 MG tablet Commonly known as: APRESOLINE Take 1 tablet (100 mg total) by mouth 3 (three) times daily.   ipratropium-albuterol 0.5-2.5 (3) MG/3ML Soln Commonly known as: DUONEB Take 3 mLs by nebulization every 6 (six) hours as needed.   isosorbide mononitrate 30 MG 24 hr tablet Commonly known as: IMDUR Take 1 tablet (30 mg total) by mouth 2 (two) times daily.   loratadine 10  MG tablet Commonly known as: CLARITIN Take 1 tablet (10 mg total) by mouth daily.   methocarbamol 500 MG tablet Commonly known as: ROBAXIN Take 1 tablet (500 mg total) by mouth 3 (three) times daily for 4 days.   metoprolol succinate 50 MG 24 hr tablet Commonly known as: TOPROL-XL Take 1 tablet (50 mg total) by mouth at bedtime. Take with or immediately following a meal.   nitroGLYCERIN 0.4 MG SL tablet Commonly known as: NITROSTAT Place 1 tablet (0.4 mg total) under the tongue every 5 (five) minutes x 3 doses as needed for chest pain.   NovoFine 32G X 6 MM Misc Generic drug: Insulin Pen Needle To use with [place] injections daily   omeprazole 40 MG capsule Commonly known as: PRILOSEC Take 1 capsule (40 mg total) by mouth daily.    oxyCODONE-acetaminophen 5-325 MG tablet Commonly known as: Percocet Take 1 tablet by mouth every 4 (four) hours as needed for severe pain.   polyethylene glycol 17 g packet Commonly known as: MIRALAX / GLYCOLAX Take 17 g by mouth daily.   potassium chloride SA 20 MEQ tablet Commonly known as: KLOR-CON Take 1 tablet (20 mEq total) by mouth daily.   sodium chloride 0.65 % Soln nasal spray Commonly known as: OCEAN Place 1 spray into both nostrils as needed for congestion.   Spiriva HandiHaler 18 MCG inhalation capsule Generic drug: tiotropium Place 1 capsule (18 mcg total) into inhaler and inhale daily.   Toujeo SoloStar 300 UNIT/ML Sopn Generic drug: Insulin Glargine (1 Unit Dial) Inject 26 Units into the skin every evening.   True Metrix Air Glucose Meter Devi 1 Device by Does not apply route 2 (two) times daily.   True Metrix Blood Glucose Test test strip Generic drug: glucose blood Use as instructed twice daily diag E11.65   True Metrix Level 1 Low Soln Use as directed   TRUEplus Lancets 33G Misc Use as directed twice daily diag E11.65            Durable Medical Equipment  (From admission, onward)         Start     Ordered   06/18/19 0914  For home use only DME Walker rolling  Once    Question:  Patient needs a walker to treat with the following condition  Answer:  Hip pain   06/18/19 0914         Allergies  Allergen Reactions  . Penicillins Rash   Discharge Instructions    Diet - low sodium heart healthy   Complete by: As directed    Discharge instructions   Complete by: As directed    It is important that you read the given instructions as well as go over your medication list with RN to help you understand your care after this hospitalization.  Please follow-up with PCP in 1-2 weeks.  Please note that NO REFILLS for any discharge medications will be authorized once you are discharged, as it is imperative that you return to your primary care  physician (or establish a relationship with a primary care physician if you do not have one) for your aftercare needs so that they can reassess your need for medications and monitor your lab values.  Please request your primary care physician to go over all Hospital Tests and Procedure/Radiological results at the follow up. Please get all Hospital records sent to your PCP by signing hospital release before you go home.   Do not drive, operating heavy machinery, perform activities  at heights, swimming or participation in water activities or provide baby sitting services while you are on Pain, Sleep and Anxiety Medications; until you have been seen by Primary Care Physician or a Neurologist and are cleared to do such activities.  Do not take more than prescribed Pain, Sleep and Anxiety Medications.  You were cared for by a hospitalist during your hospital stay. If you have any questions about your discharge medications or the care you received while you were in the hospital after you are discharged, you can call the unit @UNIT @ you were admitted to and ask to speak with the hospitalist Berle Mull. Ask for Hospitalist on call if the hospitalist that took care of you is not available.   Once you are discharged, your primary care physician will handle any further medical issues.  You Must read complete instructions/literature along with all the possible adverse reactions/side effects for all the Medicines you take and that have been prescribed to you. Take any new Medicines after you have completely understood and accept all the possible adverse reactions/side effects.  If you have smoked or chewed Tobacco in the last 2 yrs please STOP smoking STOP any Recreational drug use.  If you drink alcohol, please safely reduce the use. Do not drive, operating heavy machinery, perform activities at heights, swimming or participation in water activities or provide baby sitting services under influence.  Wear  Seat belts while driving.   Increase activity slowly   Complete by: As directed      Discharge Exam: There were no vitals filed for this visit. Vitals:   06/18/19 0750 06/18/19 1113  BP: 120/69 (!) 121/59  Pulse: 69 69  Resp: 20   Temp: 98 F (36.7 C)   SpO2: 96%    General: Appear in mild distress, no Rash; Oral Mucosa Clear, moist. no Abnormal Mass Or lumps Cardiovascular: S1 and S2 Present, no Murmur, Respiratory: normal respiratory effort, Bilateral Air entry present and Clear to Auscultation, no Crackles, Occasional  wheezes Abdomen: Bowel Sound present, Soft and no tenderness, no hernia Extremities: no Pedal edema, no calf tenderness Neurology: alert and oriented to time, place, and person affect appropriate.  The results of significant diagnostics from this hospitalization (including imaging, microbiology, ancillary and laboratory) are listed below for reference.    Significant Diagnostic Studies: DG Chest 2 View  Result Date: 06/17/2019 CLINICAL DATA:  64 year old male with fall, cough, hemoptysis. EXAM: CHEST - 2 VIEW COMPARISON:  Chest radiographs 05/19/2019 and earlier. FINDINGS: Seated AP and lateral views of the chest. Patchy and asymmetric lower lung opacity suspected last month has resolved. No pneumothorax, pulmonary edema, pleural effusion or confluent pulmonary opacity. Mediastinal contours are stable and within normal limits. Visualized tracheal air column is within normal limits. Stable visualized osseous structures. Paucity of bowel gas in the upper abdomen. IMPRESSION: No acute cardiopulmonary abnormality. Electronically Signed   By: Genevie Ann M.D.   On: 06/17/2019 13:23   CT Lumbar Spine Wo Contrast  Result Date: 06/17/2019 CLINICAL DATA:  Fall.  Back pain EXAM: CT LUMBAR SPINE WITHOUT CONTRAST TECHNIQUE: Multidetector CT imaging of the lumbar spine was performed without intravenous contrast administration. Multiplanar CT image reconstructions were also generated.  COMPARISON:  Lumbar spine radiographs 05/19/2019 FINDINGS: Segmentation: Normal Alignment: Normal Vertebrae: Negative for fracture Paraspinal and other soft tissues: Large right renal cyst, incompletely evaluated. 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 Incomplete evaluation. Atherosclerotic aorta, incompletely evaluated. Atherosclerotic calcification in the  iliac arteries bilaterally. Prostate enlargement. No enlarged retroperitoneal lymph nodes identified. Disc levels: L1-2: Mild degenerative change without stenosis L2-3: Mild disc bulging. Bilateral facet degeneration with mild spinal stenosis L3-4: Disc bulging and mild facet degeneration. Mild spinal stenosis L4-5: Diffuse disc bulging. Bilateral facet degeneration with mild spinal stenosis. L5-S1: Moderate facet degeneration bilaterally. Negative for stenosis. IMPRESSION: Negative for lumbar fracture. 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 Electronically Signed   By: Franchot Gallo M.D.   On: 06/17/2019 13:51   CT Hip Right Wo Contrast  Result Date: 06/17/2019 CLINICAL DATA:  Right hip pain after fall EXAM: CT OF THE RIGHT HIP WITHOUT CONTRAST TECHNIQUE: Multidetector CT imaging of the right hip was performed according to the standard protocol. Multiplanar CT image reconstructions were also generated. COMPARISON:  X-ray 06/17/2019, CT 04/24/2019 FINDINGS: Bones/Joint/Cartilage No acute fracture. Right hip joint is intact without dislocation. 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 Unchanged appearance of slightly fragmented osteophyte at the posterior acetabulum (series 12, image 39; series 6, image 39), compared to prior CT. Right SI joint and pubic symphysis are intact without diastasis. No lytic  or sclerotic osseous lesion. No appreciable hip joint effusion. Ligaments Suboptimally assessed by CT. Muscles and Tendons Preservation of the muscle bulk. No obvious muscular or tendinous abnormality within the limitations of CT. Soft tissues No soft tissue fluid collection or hematoma. There scattered atherosclerotic calcifications. Scattered colonic diverticulosis. No acute findings within the visualized portion of the pelvis. IMPRESSION: 1. No acute fracture or dislocation of the right hip joint. 2. 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 Electronically Signed   By: Davina Poke D.O.   On: 06/17/2019 13:49   DG Hip Unilat  With Pelvis 2-3 Views Right  Result Date: 06/17/2019 CLINICAL DATA:  Pt states right hip pain after his legs gave out this am and he fell, states he can't walk at this time since fall/due to pain. EXAM: DG HIP (WITH OR WITHOUT PELVIS) 2-3V RIGHT COMPARISON:  Radiograph 05/19/2019 FINDINGS: Hips are located.  No pelvic fracture. Dedicated view of the RIGHT hip demonstrates a ring of osteophytes at the around the femoral neck not changed from comparison exam. No evidence of acute fracture dislocation. IMPRESSION: 1. Ring of osteophytes around the RIGHT femoral neck similar to comparison radiograph. 2. Severe joint space narrowing at the RIGHT hip joint. Osteoarthritis of the hip joints, RIGHT greater than LEFT. 3. No acute fracture dislocation. Electronically Signed   By: Suzy Bouchard M.D.   On: 06/17/2019 12:34    Microbiology: Recent Results (from the past 240 hour(s))  SARS CORONAVIRUS 2 (TAT 6-24 HRS) Nasopharyngeal Nasopharyngeal Swab     Status: None   Collection Time: 06/17/19  3:55 PM   Specimen: Nasopharyngeal Swab  Result Value Ref Range Status   SARS Coronavirus 2 NEGATIVE NEGATIVE Final    Comment: (NOTE) SARS-CoV-2 target nucleic acids are NOT DETECTED. The SARS-CoV-2 RNA is generally detectable in upper and lower respiratory specimens during the acute  phase of infection. Negative results do not preclude SARS-CoV-2 infection, do not rule out co-infections with other pathogens, and should not be used as the sole basis for treatment or other patient management decisions. Negative results must be combined with clinical observations, patient history, and epidemiological information. The expected result is Negative. Fact Sheet for Patients: SugarRoll.be Fact Sheet for Healthcare  Providers: https://www.woods-mathews.com/ This test is not yet approved or cleared by the [place] and  has been authorized for detection and/or diagnosis of SARS-CoV-2 by FDA under an Emergency Use Authorization (EUA). This EUA will remain  in effect (meaning this test can be used) for the duration of the COVID-19 declaration under Section 56 4(b)(1) of the Act, 21 U.S.C. section 360bbb-3(b)(1), unless the authorization is terminated or revoked sooner. Performed at [facility], [place] [address], [place], [place] [address]      Labs: CBC: Recent Labs  Lab 06/17/19 1305 06/18/19 0614  WBC 9.0 6.8  NEUTROABS 6.7  --   HGB 12.1* 11.1*  HCT 36.7* 34.4*  MCV 85.2 86.6  PLT 242 233   Basic Metabolic Panel: Recent Labs  Lab 06/17/19 1305 06/18/19 0614  NA 137 137  K 3.1* 3.0*  CL 100 102  CO2 20* 24  GLUCOSE 158* 118*  BUN 60* 65*  CREATININE 2.16* 2.30*  CALCIUM 9.4 9.0   Liver Function Tests: Recent Labs  Lab 06/17/19 1305  AST 22  ALT 15  ALKPHOS 82  BILITOT 0.8  PROT 7.0  ALBUMIN 3.3*   Recent Labs  Lab 06/17/19 1305  LIPASE 32   No results for input(s): AMMONIA in the last 168 hours. Cardiac Enzymes: No results for input(s): CKTOTAL, CKMB, CKMBINDEX, TROPONINI in the last 168 hours. BNP (last 3 results) Recent Labs    04/24/19 1122 05/10/19 1640 05/19/19 1522  BNP 88.0 75.0 143.0*   CBG: Recent Labs  Lab 06/17/19 2354 06/18/19 0230 06/18/19 0748 06/18/19 1148    GLUCAP 191* 118* 116* 227*    Time spent: 35 minutes  Signed:  Berle Mull  [facility] 06/18/2019 8:25 PM

## 2019-06-18 NOTE — Progress Notes (Signed)
Received MD order to discharge patient to home, reviewed home med, discharge instructions, prescriptions and follow up appointments with patient and patient verbalized understanding

## 2019-06-18 NOTE — ED Notes (Signed)
ED TO INPATIENT HANDOFF REPORT  ED Nurse Name and Phone #:  Anson Crofts Name/Age/Gender Larry Callahan 64 y.o. male Room/Bed: ED36A/ED36A  Code Status   Code Status: Full Code  Home/SNF/Other Home Patient oriented to: self, place, time and situation Is this baseline? Yes   Triage Complete: Triage complete  Chief Complaint Acute right hip pain [M25.551]  Triage Note Pt states right hip pain after his legs gave out this am and he fell, states he can't walk at this time since fall/due to pain. Also states he "is coughing up blood again, happened one other time before." Denies known covid exposure. Sats 97% on RA. NAD.   Pt appears anxious, states he lost his taste ever since he left the hospital. Pt attempting to spit in triage after coughing and states can't because he his mouth is dry. 4 weeks ago he states was the last time he was here    Allergies Allergies  Allergen Reactions  . Penicillins Rash    Level of Care/Admitting Diagnosis ED Disposition    ED Disposition Condition [facility] Area: [place] [[id number]]  Level of Care: Med-Surg [16]  Covid Evaluation: Asymptomatic Screening Protocol (No Symptoms)  Diagnosis: Acute right hip pain [130865]  Admitting Physician: Ezekiel Slocumb [[id number]]  Attending Physician: Ezekiel Slocumb [[id number]]  PT Class (Do Not Modify): Observation [104]  PT Acc Code (Do Not Modify): Observation [10022]       B Medical/Surgery History Past Medical History:  Diagnosis Date  . Allergy   . Coronary artery disease   . Diabetes mellitus without complication ([employer])   . Diastolic CHF ([place])   . GERD (gastroesophageal reflux disease)   . Hyperlipidemia   . Hypertension   . Renal insufficiency   . Sleep apnea    Past Surgical History:  Procedure Laterality Date  . BACK SURGERY    . CHOLECYSTECTOMY    . COLONOSCOPY WITH PROPOFOL N/A 04/12/2019   Procedure: COLONOSCOPY WITH PROPOFOL;   Surgeon: [name], MD;  Location: [facility] ENDOSCOPY;  Service: Gastroenterology;  Laterality: N/A;  . ESOPHAGOGASTRODUODENOSCOPY N/A 04/12/2019   Procedure: ESOPHAGOGASTRODUODENOSCOPY (EGD);  Surgeon: [name], MD;  Location: [facility] ENDOSCOPY;  Service: Gastroenterology;  Laterality: N/A;  . FLEXIBLE BRONCHOSCOPY Bilateral 05/17/2019   Procedure: FLEXIBLE BRONCHOSCOPY;  Surgeon: [name], MD;  Location: [name] ORS;  Service: Pulmonary;  Laterality: Bilateral;  . left arm surgery    . nephrectomy Left   . PARATHYROIDECTOMY    . RIGHT HEART CATH N/A 03/29/2019   Procedure: RIGHT HEART CATH;  Surgeon: [name], MD;  Location: [place] CV LAB;  Service: Cardiovascular;  Laterality: N/A;     A IV Location/Drains/Wounds Patient Lines/Drains/Airways Status   Active Line/Drains/Airways    None          Intake/Output Last 24 hours  Intake/Output Summary (Last 24 hours) at 06/18/2019 0051 Last data filed at 06/17/2019 2348 Gross per 24 hour  Intake 240 ml  Output 500 ml  Net -260 ml    Labs/Imaging Results for orders placed or performed during the hospital encounter of 06/17/19 (from the past 48 hour(s))  CBC with Differential     Status: Abnormal   Collection Time: 06/17/19  1:05 PM  Result Value Ref Range   WBC 9.0 4.0 - 10.5 K/uL   RBC 4.31 4.22 - 5.81 MIL/uL   Hemoglobin 12.1 (L) 13.0 - 17.0 g/dL   HCT 36.7 (  L) 39.0 - 52.0 %   MCV 85.2 80.0 - 100.0 fL   MCH 28.1 26.0 - 34.0 pg   MCHC 33.0 30.0 - 36.0 g/dL   RDW 14.6 11.5 - 15.5 %   Platelets 242 150 - 400 K/uL   nRBC 0.0 0.0 - 0.2 %   Neutrophils Relative % 73 %   Neutro Abs 6.7 1.7 - 7.7 K/uL   Lymphocytes Relative 16 %   Lymphs Abs 1.4 0.7 - 4.0 K/uL   Monocytes Relative 8 %   Monocytes Absolute 0.7 0.1 - 1.0 K/uL   Eosinophils Relative 1 %   Eosinophils Absolute 0.1 0.0 - 0.5 K/uL   Basophils Relative 1 %   Basophils Absolute 0.1 0.0 - 0.1 K/uL   Immature Granulocytes 1 %   Abs  Immature Granulocytes 0.05 0.00 - 0.07 K/uL    Comment: Performed at [facility], [address] [place], [place] [address]  Comprehensive metabolic panel     Status: Abnormal   Collection Time: 06/17/19  1:05 PM  Result Value Ref Range   Sodium 137 135 - 145 mmol/L   Potassium 3.1 (L) 3.5 - 5.1 mmol/L   Chloride 100 98 - 111 mmol/L   CO2 20 (L) 22 - 32 mmol/L   Glucose, Bld 158 (H) 70 - 99 mg/dL   BUN 60 (H) 8 - 23 mg/dL   Creatinine, Ser 2.16 (H) 0.61 - 1.24 mg/dL   Calcium 9.4 8.9 - 10.3 mg/dL   Total Protein 7.0 6.5 - 8.1 g/dL   Albumin 3.3 (L) 3.5 - 5.0 g/dL   AST 22 15 - 41 U/L   ALT 15 0 - 44 U/L   Alkaline Phosphatase 82 38 - 126 U/L   Total Bilirubin 0.8 0.3 - 1.2 mg/dL   GFR calc non Af Amer 31 (L) >60 mL/min   GFR calc Af Amer 36 (L) >60 mL/min   Anion gap 17 (H) 5 - 15    Comment: Performed at [facility], [address] [place], [place] [address]  Protime-INR     Status: None   Collection Time: 06/17/19  1:05 PM  Result Value Ref Range   Prothrombin Time 13.0 11.4 - 15.2 seconds   INR 1.0 0.8 - 1.2    Comment: (NOTE) INR goal varies based on device and disease states. Performed at [facility], [address] [place], [place] [address]   APTT     Status: None   Collection Time: 06/17/19  1:05 PM  Result Value Ref Range   aPTT 31 24 - 36 seconds    Comment: Performed at [facility], [address] [place], [place] [address]  Lipase, blood     Status: None   Collection Time: 06/17/19  1:05 PM  Result Value Ref Range   Lipase 32 11 - 51 U/L    Comment: Performed at [facility], [address] [place],  [address]  Urinalysis, Complete w Microscopic     Status: Abnormal   Collection Time: 06/17/19  1:06 PM  Result Value Ref Range   Color, Urine YELLOW (A) YELLOW   APPearance CLEAR (A) CLEAR   Specific Gravity, Urine 1.010 1.005 - 1.030   pH 5.0 5.0 - 8.0   Glucose, UA NEGATIVE NEGATIVE mg/dL    Hgb urine dipstick NEGATIVE NEGATIVE   Bilirubin Urine NEGATIVE NEGATIVE   Ketones, ur NEGATIVE NEGATIVE mg/dL   Protein, ur >=300 (A) NEGATIVE mg/dL  Nitrite NEGATIVE NEGATIVE   Leukocytes,Ua NEGATIVE NEGATIVE   RBC / HPF 0-5 0 - 5 RBC/hpf   WBC, UA 0-5 0 - 5 WBC/hpf   Bacteria, UA RARE (A) NONE SEEN   Squamous Epithelial / LPF NONE SEEN 0 - 5    Comment: Performed at [facility], [address] [place], [place] [address]  Glucose, capillary     Status: Abnormal   Collection Time: 06/17/19 11:54 PM  Result Value Ref Range   Glucose-Capillary 191 (H) 70 - 99 mg/dL   DG Chest 2 View  Result Date: 06/17/2019 CLINICAL DATA:  64 year old male with fall, cough, hemoptysis. EXAM: CHEST - 2 VIEW COMPARISON:  Chest radiographs 05/19/2019 and earlier. FINDINGS: Seated AP and lateral views of the chest. Patchy and asymmetric lower lung opacity suspected last month has resolved. No pneumothorax, pulmonary edema, pleural effusion or confluent pulmonary opacity. Mediastinal contours are stable and within normal limits. Visualized tracheal air column is within normal limits. Stable visualized osseous structures. Paucity of bowel gas in the upper abdomen. IMPRESSION: No acute cardiopulmonary abnormality. Electronically Signed   By: Genevie Ann M.D.   On: 06/17/2019 13:23   CT Lumbar Spine Wo Contrast  Result Date: 06/17/2019 CLINICAL DATA:  Fall.  Back pain EXAM: CT LUMBAR SPINE WITHOUT CONTRAST TECHNIQUE: Multidetector CT imaging of the lumbar spine was performed without intravenous contrast administration. Multiplanar CT image reconstructions were also generated. COMPARISON:  Lumbar spine radiographs 05/19/2019 FINDINGS: Segmentation: Normal Alignment: Normal Vertebrae: Negative for fracture Paraspinal and other soft tissues: Large right renal cyst, incompletely evaluated. 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 Incomplete evaluation.  Atherosclerotic aorta, incompletely evaluated. Atherosclerotic calcification in the iliac arteries bilaterally. Prostate enlargement. No enlarged retroperitoneal lymph nodes identified. Disc levels: L1-2: Mild degenerative change without stenosis L2-3: Mild disc bulging. Bilateral facet degeneration with mild spinal stenosis L3-4: Disc bulging and mild facet degeneration. Mild spinal stenosis L4-5: Diffuse disc bulging. Bilateral facet degeneration with mild spinal stenosis. L5-S1: Moderate facet degeneration bilaterally. Negative for stenosis. IMPRESSION: Negative for lumbar fracture. 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 Electronically Signed   By: Franchot Gallo M.D.   On: 06/17/2019 13:51   CT Hip Right Wo Contrast  Result Date: 06/17/2019 CLINICAL DATA:  Right hip pain after fall EXAM: CT OF THE RIGHT HIP WITHOUT CONTRAST TECHNIQUE: Multidetector CT imaging of the right hip was performed according to the standard protocol. Multiplanar CT image reconstructions were also generated. COMPARISON:  X-ray 06/17/2019, CT 04/24/2019 FINDINGS: Bones/Joint/Cartilage No acute fracture. Right hip joint is intact without dislocation. 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 Unchanged appearance of slightly fragmented osteophyte at the posterior acetabulum (series 12, image 39; series 6, image 39), compared to prior CT. Right SI joint and pubic symphysis are intact without diastasis. No lytic or sclerotic osseous lesion. No appreciable hip joint effusion. Ligaments Suboptimally assessed by CT. Muscles and Tendons Preservation of the muscle bulk. No obvious muscular or tendinous abnormality within the limitations of CT. Soft tissues No soft tissue fluid collection or hematoma. There scattered atherosclerotic  calcifications. Scattered colonic diverticulosis. No acute findings within the visualized portion of the pelvis. IMPRESSION: 1. No acute fracture or dislocation of the right hip joint. 2. 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 Electronically Signed   By: Hart Carwin  Plundo D.O.   On: 06/17/2019 13:49   DG Hip Unilat  With Pelvis 2-3 Views Right  Result Date: 06/17/2019 CLINICAL DATA:  Pt states right hip pain after his legs gave out this am and he fell, states he can't walk at this time since fall/due to pain. EXAM: DG HIP (WITH OR WITHOUT PELVIS) 2-3V RIGHT COMPARISON:  Radiograph 05/19/2019 FINDINGS: Hips are located.  No pelvic fracture. Dedicated view of the RIGHT hip demonstrates a ring of osteophytes at the around the femoral neck not changed from comparison exam. No evidence of acute fracture dislocation. IMPRESSION: 1. Ring of osteophytes around the RIGHT femoral neck similar to comparison radiograph. 2. Severe joint space narrowing at the RIGHT hip joint. Osteoarthritis of the hip joints, RIGHT greater than LEFT. 3. No acute fracture dislocation. Electronically Signed   By: Suzy Bouchard M.D.   On: 06/17/2019 12:34    Pending Labs Unresulted Labs (From admission, onward)    Start     Ordered   06/18/19 9629  Basic metabolic panel  Tomorrow morning,   STAT     06/17/19 1605   06/18/19 0500  CBC  Tomorrow morning,   STAT     06/17/19 1605   06/17/19 1535  SARS CORONAVIRUS 2 (TAT 6-24 HRS) Nasopharyngeal Nasopharyngeal Swab  (Tier 3 (TAT 6-24 hrs))  ONCE - STAT,   STAT    Question Answer Comment  Is this test for diagnosis or screening Screening   Symptomatic for COVID-19 as defined by CDC No   Hospitalized for COVID-19 No   Admitted to ICU for COVID-19 No   Previously tested for COVID-19 Unknown   Resident in a congregate (group) care setting No   Employed in healthcare setting No      06/17/19 1534          Vitals/Pain Today's Vitals   06/17/19 2100 06/17/19 2130  06/17/19 2200 06/17/19 2344  BP: (!) 152/67 130/66 (!) 153/75 (!) 165/79  Pulse: 73 73 73 72  Resp:      Temp:      TempSrc:      SpO2: 96% 98% 96% 98%  PainSc:        Isolation Precautions No active isolations  Medications Medications  acetaminophen (TYLENOL) tablet 650 mg (has no administration in time range)    Or  acetaminophen (TYLENOL) suppository 650 mg (has no administration in time range)  traMADol (ULTRAM) tablet 50 mg (has no administration in time range)  ketorolac (TORADOL) 30 MG/ML injection 15 mg (has no administration in time range)  traZODone (DESYREL) tablet 50 mg (has no administration in time range)  polyethylene glycol (MIRALAX / GLYCOLAX) packet 17 g (has no administration in time range)  bisacodyl (DULCOLAX) EC tablet 5 mg (has no administration in time range)  magnesium citrate solution 1 Bottle (has no administration in time range)  ondansetron (ZOFRAN) tablet 4 mg (has no administration in time range)    Or  ondansetron (ZOFRAN) injection 4 mg (has no administration in time range)  amLODipine (NORVASC) tablet 10 mg (10 mg Oral Given 06/17/19 2345)  furosemide (LASIX) tablet 80 mg (80 mg Oral Given 06/17/19 2346)  gemfibrozil (LOPID) tablet 600 mg (has no administration in time range)  hydrALAZINE (APRESOLINE) tablet 100 mg (100 mg Oral Given 06/17/19 2347)  isosorbide mononitrate (IMDUR) 24 hr tablet 30 mg (30 mg Oral Given 06/18/19 0047)  metoprolol succinate (TOPROL-XL) 24 hr tablet 50 mg (has no administration in time range)  nitroGLYCERIN (NITROSTAT)  SL tablet 0.4 mg (has no administration in time range)  insulin glargine (LANTUS) injection 26 Units (26 Units Subcutaneous Given 06/17/19 2245)  glimepiride (AMARYL) tablet 2 mg (has no administration in time range)  pantoprazole (PROTONIX) EC tablet 40 mg (40 mg Oral Given 06/17/19 2346)  ferrous gluconate (FERGON) tablet 324 mg (has no administration in time range)  albuterol (VENTOLIN HFA) 108 (90 Base) MCG/ACT  inhaler 2 puff (has no administration in time range)  mometasone-formoterol (DULERA) 200-5 MCG/ACT inhaler 2 puff (2 puffs Inhalation Given 06/18/19 0047)  ipratropium-albuterol (DUONEB) 0.5-2.5 (3) MG/3ML nebulizer solution 3 mL (has no administration in time range)  loratadine (CLARITIN) tablet 10 mg (10 mg Oral Given 06/17/19 2346)  tiotropium (SPIRIVA) inhalation capsule ([name] use ONLY) 18 mcg (18 mcg Inhalation Given 06/18/19 0048)  insulin aspart (novoLOG) injection 0-15 Units (has no administration in time range)  azithromycin (ZITHROMAX) 500 mg in sodium chloride 0.9 % 250 mL IVPB (0 mg Intravenous Stopped 06/17/19 2330)  oxyCODONE (Oxy IR/ROXICODONE) immediate release tablet 5 mg (5 mg Oral Given 06/17/19 1422)  acetaminophen (TYLENOL) tablet 1,000 mg (1,000 mg Oral Given 06/17/19 1421)  doxycycline (VIBRA-TABS) tablet 100 mg (100 mg Oral Given 06/17/19 1555)    Mobility walks with device Low fall risk   Focused Assessments Fall, mobility   R Recommendations: See Admitting Provider Note  Report given to:   Additional Notes:

## 2019-06-18 NOTE — Plan of Care (Signed)
Pt admitted after fall at home for pain in R hip.  Xray showed no fx or dislocation.  Will be evaluated by PT.

## 2019-06-18 NOTE — Evaluation (Signed)
Physical Therapy Evaluation Patient Details Name: Larry Callahan MRN: [id number] DOB: 1955/10/24 Today's Date: 06/18/2019   History of Present Illness  64 y/o male here after fall backward onto R posterior hip, here with c/o significant R hip pain.  Imaging reveals no fracture or dislocation.  Other history includes CHF, HTN, COPD, and DM.   Clinical Impression  Pt was hesitant to do a lot secondary to continued pain in R hip.  He did show some effort with getting to sitting, standing and an attempt at ambulation but ultimately he was very limited with what he was able to tolerate and even with heavy UE use of walker (he will need one on d/c) he was unable to tolerate more than very minimal ambulation.  Pt hesitant to do a lot secondary to pain and though he has some family that would be able to assist with ALDs, errands, etc he is hesitant to go home and admittedly is functionally limited at this time too.  Hoping that pain can be better managed and he is able to do more in-home appropriate ambulation and trial steps.      Follow Up Recommendations Home health PT;Supervision - Intermittent(Pt should be able to return home once pain is managed)    Equipment Recommendations  Rolling walker with 5" wheels    Recommendations for Other Services       Precautions / Restrictions Precautions Precautions: Fall Restrictions Weight Bearing Restrictions: No      Mobility  Bed Mobility Overal bed mobility: Independent             General bed mobility comments: Pt with slow to get to sitting secondary to pain but with UE use on rails was able to rise w/o assist  Transfers Overall transfer level: Needs assistance Equipment used: Rolling walker (2 wheeled) Transfers: Sit to/from Stand Sit to Stand: Min assist         General transfer comment: elevated bed, cues for appropriate UE use, shows hesitancy and c/o pain but able to rise on second attempt w/ only very light phyiscal  assist  Ambulation/Gait Ambulation/Gait assistance: Min guard;Min assist Gait Distance (Feet): 5 Feet Assistive device: Rolling walker (2 wheeled)       General Gait Details: Pt very hesitant to do much at all with R LE.  He was able to use UEs to take some weight through the walker but after just 2-3 steps aborted the effort and refused to do any further walking and shuffled backward to the bed and sat down.  Unwilling to try again at this time  Stairs            Wheelchair Mobility    Modified Rankin (Stroke Patients Only)       Balance Overall balance assessment: Needs assistance   Sitting balance-Leahy Scale: Fair Sitting balance - Comments: pain limited, leaning away from R hip     Standing balance-Leahy Scale: Poor Standing balance comment: Pt was reliant on the walker and showed little confidence, stability or tolerance in standing secondary to R hip pain and inability to functionally bear weight                             Pertinent Vitals/Pain Pain Assessment: 0-10 Pain Score: 8  Pain Location: posterior L hip/sacral area    Home Living Family/patient expects to be discharged to:: Private residence Living Arrangements: Alone Available Help at Discharge: Family(one sister lives close, another  sister provides transport) Type of Home: Mobile home Home Access: Stairs to enter Entrance Stairs-Rails: Left Entrance Stairs-Number of Steps: 5 Home Layout: One level Home Equipment: Cane - single point      Prior Function Level of Independence: Independent         Comments: reports he was fairly active. 1 fall in last 6 month prior to this fall.     Hand Dominance        Extremity/Trunk Assessment   Upper Extremity Assessment Upper Extremity Assessment: Overall WFL for tasks assessed    Lower Extremity Assessment Lower Extremity Assessment: Overall WFL for tasks assessed;Generalized weakness(pain limited on R but appears functional in  [place] setting)       Communication   Communication: No difficulties  Cognition Arousal/Alertness: Awake/alert Behavior During Therapy: WFL for tasks assessed/performed Overall Cognitive Status: Within Functional Limits for tasks assessed                                        General Comments      Exercises     Assessment/Plan    PT Assessment Patient needs continued PT services  PT Problem List Decreased strength;Decreased range of motion;Decreased activity tolerance;Decreased mobility;Decreased balance;Decreased knowledge of use of DME;Decreased safety awareness;Pain       PT Treatment Interventions DME instruction;Gait training;Stair training;Functional mobility training;Therapeutic activities;Therapeutic exercise;Balance training;Neuromuscular re-education;Patient/family education    PT Goals (Current goals can be found in the Care Plan section)  Acute Rehab PT Goals Patient Stated Goal: get pain more controlled before he goes home PT Goal Formulation: With patient Time For Goal Achievement: 07/02/19 Potential to Achieve Goals: Fair    Frequency Min 2X/week   Barriers to discharge        Co-evaluation               AM-PAC PT "6 Clicks" Mobility  Outcome Measure Help needed turning from your back to your side while in a flat bed without using bedrails?: A Little Help needed moving from lying on your back to sitting on the side of a flat bed without using bedrails?: A Little Help needed moving to and from a bed to a chair (including a wheelchair)?: A Lot Help needed standing up from a chair using your arms (e.g., wheelchair or bedside chair)?: A Little Help needed to walk in hospital room?: A Lot Help needed climbing 3-5 steps with a railing? : A Lot 6 Click Score: 15    End of Session Equipment Utilized During Treatment: Gait belt(pt on 2L on arrival, 97%, stayed 93-95% on room air t/o PT) Activity Tolerance: Patient limited by  pain Patient left: with bed alarm set;with call bell/phone within reach   PT Visit Diagnosis: Muscle weakness (generalized) (M62.81);Difficulty in walking, not elsewhere classified (R26.2);Pain Pain - Right/Left: Right Pain - part of body: Hip    Time: 6256-3893 PT Time Calculation (min) (ACUTE ONLY): 27 min   Charges:   PT Evaluation $PT Eval Low Complexity: 1 Low PT Treatments $Therapeutic Activity: 8-22 mins        Kreg Shropshire, DPT 06/18/2019, 10:38 AM

## 2019-06-20 DIAGNOSIS — I110 Hypertensive heart disease with heart failure: Secondary | ICD-10-CM | POA: Diagnosis not present

## 2019-06-20 DIAGNOSIS — N183 Chronic kidney disease, stage 3 unspecified: Secondary | ICD-10-CM | POA: Diagnosis not present

## 2019-06-20 DIAGNOSIS — I5030 Unspecified diastolic (congestive) heart failure: Secondary | ICD-10-CM | POA: Diagnosis not present

## 2019-06-20 DIAGNOSIS — G4733 Obstructive sleep apnea (adult) (pediatric): Secondary | ICD-10-CM | POA: Diagnosis not present

## 2019-06-20 DIAGNOSIS — E1122 Type 2 diabetes mellitus with diabetic chronic kidney disease: Secondary | ICD-10-CM | POA: Diagnosis not present

## 2019-06-20 DIAGNOSIS — I2510 Atherosclerotic heart disease of native coronary artery without angina pectoris: Secondary | ICD-10-CM | POA: Diagnosis not present

## 2019-06-20 DIAGNOSIS — J441 Chronic obstructive pulmonary disease with (acute) exacerbation: Secondary | ICD-10-CM | POA: Diagnosis not present

## 2019-06-20 DIAGNOSIS — M160 Bilateral primary osteoarthritis of hip: Secondary | ICD-10-CM | POA: Diagnosis not present

## 2019-06-20 DIAGNOSIS — E1142 Type 2 diabetes mellitus with diabetic polyneuropathy: Secondary | ICD-10-CM | POA: Diagnosis not present

## 2019-06-25 ENCOUNTER — Telehealth (HOSPITAL_COMMUNITY): Payer: Self-pay

## 2019-06-25 ENCOUNTER — Telehealth: Payer: Self-pay

## 2019-06-25 DIAGNOSIS — I5030 Unspecified diastolic (congestive) heart failure: Secondary | ICD-10-CM | POA: Diagnosis not present

## 2019-06-25 DIAGNOSIS — E1142 Type 2 diabetes mellitus with diabetic polyneuropathy: Secondary | ICD-10-CM | POA: Diagnosis not present

## 2019-06-25 DIAGNOSIS — J441 Chronic obstructive pulmonary disease with (acute) exacerbation: Secondary | ICD-10-CM | POA: Diagnosis not present

## 2019-06-25 DIAGNOSIS — E1122 Type 2 diabetes mellitus with diabetic chronic kidney disease: Secondary | ICD-10-CM | POA: Diagnosis not present

## 2019-06-25 DIAGNOSIS — I110 Hypertensive heart disease with heart failure: Secondary | ICD-10-CM | POA: Diagnosis not present

## 2019-06-25 DIAGNOSIS — M160 Bilateral primary osteoarthritis of hip: Secondary | ICD-10-CM | POA: Diagnosis not present

## 2019-06-25 DIAGNOSIS — I2510 Atherosclerotic heart disease of native coronary artery without angina pectoris: Secondary | ICD-10-CM | POA: Diagnosis not present

## 2019-06-25 DIAGNOSIS — N183 Chronic kidney disease, stage 3 unspecified: Secondary | ICD-10-CM | POA: Diagnosis not present

## 2019-06-25 DIAGNOSIS — G4733 Obstructive sleep apnea (adult) (pediatric): Secondary | ICD-10-CM | POA: Diagnosis not present

## 2019-06-25 NOTE — Telephone Encounter (Signed)
Jaxen contacted me today and advised he has a pain in his neck and a headache.  His physical therapy was there today and advised him he could be having a stroke to call 911 and go to ED.  This is after she did his physical therapy and he states he was able to do everything she asked of him.  He states he is walking good, he is talking in full sentences, no slurring of words.  He is very coherent.  He denies any other problems.  He did say it felt a little better after his physical therapy.  He states could not sleep well last night and been up since 3 am.  He states was up cooking a steak at 3 am this morning.  Asked if he needed to see his doctor and I would try to get him in, he refused states he believes he is ok.  Asked if he needed me to come out, he refused and again said he will take a tylenol and was going to walk to his sisters house a few houses down the street.  He states other than the headache and pain in neck he is fine.  Advised him I would call back and check on him, he said probably will not be home.  Will continue to visit for heart failure.   [place] [phone]

## 2019-06-25 NOTE — Telephone Encounter (Signed)
Spoke with Larry Callahan at [place] at home and ok'd physical therapy  Once one week/ 3times one week/ 2times for 4 weeks/ 1 time for 2 weeks. [phone] [employer]

## 2019-06-26 ENCOUNTER — Telehealth: Payer: Self-pay

## 2019-06-26 NOTE — Telephone Encounter (Signed)
Patients sister([place]) called stated patient felt like he was having a stroke and could not see told sister([place]) to call an ambulance patient needs to be seen right away. klh

## 2019-06-27 ENCOUNTER — Other Ambulatory Visit: Payer: Self-pay

## 2019-06-27 DIAGNOSIS — J441 Chronic obstructive pulmonary disease with (acute) exacerbation: Secondary | ICD-10-CM | POA: Diagnosis not present

## 2019-06-27 DIAGNOSIS — E1122 Type 2 diabetes mellitus with diabetic chronic kidney disease: Secondary | ICD-10-CM | POA: Diagnosis not present

## 2019-06-27 DIAGNOSIS — M160 Bilateral primary osteoarthritis of hip: Secondary | ICD-10-CM | POA: Diagnosis not present

## 2019-06-27 DIAGNOSIS — E1142 Type 2 diabetes mellitus with diabetic polyneuropathy: Secondary | ICD-10-CM | POA: Diagnosis not present

## 2019-06-27 DIAGNOSIS — N183 Chronic kidney disease, stage 3 unspecified: Secondary | ICD-10-CM | POA: Diagnosis not present

## 2019-06-27 DIAGNOSIS — I2510 Atherosclerotic heart disease of native coronary artery without angina pectoris: Secondary | ICD-10-CM | POA: Diagnosis not present

## 2019-06-27 DIAGNOSIS — I5030 Unspecified diastolic (congestive) heart failure: Secondary | ICD-10-CM | POA: Diagnosis not present

## 2019-06-27 DIAGNOSIS — G4733 Obstructive sleep apnea (adult) (pediatric): Secondary | ICD-10-CM | POA: Diagnosis not present

## 2019-06-27 DIAGNOSIS — I110 Hypertensive heart disease with heart failure: Secondary | ICD-10-CM | POA: Diagnosis not present

## 2019-06-27 MED ORDER — FUROSEMIDE 80 MG PO TABS: 80.0000 mg | ORAL_TABLET | Freq: Two times a day (BID) | ORAL | 0 refills | Status: DC

## 2019-06-27 MED ORDER — SPIRIVA HANDIHALER 18 MCG IN CAPS: 18.0000 ug | ORAL_CAPSULE | Freq: Every day | RESPIRATORY_TRACT | 0 refills | Status: DC

## 2019-06-27 MED ORDER — GEMFIBROZIL 600 MG PO TABS: 600.0000 mg | ORAL_TABLET | Freq: Two times a day (BID) | ORAL | 1 refills | Status: DC

## 2019-06-28 ENCOUNTER — Telehealth: Payer: Self-pay

## 2019-06-28 DIAGNOSIS — I110 Hypertensive heart disease with heart failure: Secondary | ICD-10-CM | POA: Diagnosis not present

## 2019-06-28 DIAGNOSIS — I2510 Atherosclerotic heart disease of native coronary artery without angina pectoris: Secondary | ICD-10-CM | POA: Diagnosis not present

## 2019-06-28 DIAGNOSIS — N183 Chronic kidney disease, stage 3 unspecified: Secondary | ICD-10-CM | POA: Diagnosis not present

## 2019-06-28 DIAGNOSIS — G4733 Obstructive sleep apnea (adult) (pediatric): Secondary | ICD-10-CM | POA: Diagnosis not present

## 2019-06-28 DIAGNOSIS — J441 Chronic obstructive pulmonary disease with (acute) exacerbation: Secondary | ICD-10-CM | POA: Diagnosis not present

## 2019-06-28 DIAGNOSIS — M160 Bilateral primary osteoarthritis of hip: Secondary | ICD-10-CM | POA: Diagnosis not present

## 2019-06-28 DIAGNOSIS — E1142 Type 2 diabetes mellitus with diabetic polyneuropathy: Secondary | ICD-10-CM | POA: Diagnosis not present

## 2019-06-28 DIAGNOSIS — E1122 Type 2 diabetes mellitus with diabetic chronic kidney disease: Secondary | ICD-10-CM | POA: Diagnosis not present

## 2019-06-28 DIAGNOSIS — I5030 Unspecified diastolic (congestive) heart failure: Secondary | ICD-10-CM | POA: Diagnosis not present

## 2019-06-28 NOTE — Telephone Encounter (Signed)
Lmom for patient to confirm 07-02-19 ov as virtual.

## 2019-06-30 LAB — ACID FAST CULTURE WITH REFLEXED SENSITIVITIES (MYCOBACTERIA): Acid Fast Culture: NEGATIVE

## 2019-07-02 ENCOUNTER — Ambulatory Visit (INDEPENDENT_AMBULATORY_CARE_PROVIDER_SITE_OTHER): Payer: Medicare PPO | Admitting: Internal Medicine

## 2019-07-02 ENCOUNTER — Encounter: Payer: Self-pay | Admitting: Internal Medicine

## 2019-07-02 ENCOUNTER — Telehealth: Payer: Self-pay | Admitting: Cardiovascular Disease

## 2019-07-02 VITALS — Ht 71.0 in

## 2019-07-02 DIAGNOSIS — K439 Ventral hernia without obstruction or gangrene: Secondary | ICD-10-CM | POA: Diagnosis not present

## 2019-07-02 DIAGNOSIS — G4733 Obstructive sleep apnea (adult) (pediatric): Secondary | ICD-10-CM | POA: Diagnosis not present

## 2019-07-02 DIAGNOSIS — N183 Chronic kidney disease, stage 3 unspecified: Secondary | ICD-10-CM | POA: Diagnosis not present

## 2019-07-02 DIAGNOSIS — I5033 Acute on chronic diastolic (congestive) heart failure: Secondary | ICD-10-CM

## 2019-07-02 DIAGNOSIS — I2510 Atherosclerotic heart disease of native coronary artery without angina pectoris: Secondary | ICD-10-CM | POA: Diagnosis not present

## 2019-07-02 DIAGNOSIS — M160 Bilateral primary osteoarthritis of hip: Secondary | ICD-10-CM | POA: Diagnosis not present

## 2019-07-02 DIAGNOSIS — E1142 Type 2 diabetes mellitus with diabetic polyneuropathy: Secondary | ICD-10-CM | POA: Diagnosis not present

## 2019-07-02 DIAGNOSIS — D4412 Neoplasm of uncertain behavior of left adrenal gland: Secondary | ICD-10-CM | POA: Diagnosis not present

## 2019-07-02 DIAGNOSIS — E1122 Type 2 diabetes mellitus with diabetic chronic kidney disease: Secondary | ICD-10-CM | POA: Diagnosis not present

## 2019-07-02 DIAGNOSIS — I5030 Unspecified diastolic (congestive) heart failure: Secondary | ICD-10-CM | POA: Diagnosis not present

## 2019-07-02 DIAGNOSIS — J441 Chronic obstructive pulmonary disease with (acute) exacerbation: Secondary | ICD-10-CM | POA: Diagnosis not present

## 2019-07-02 DIAGNOSIS — Z9989 Dependence on other enabling machines and devices: Secondary | ICD-10-CM

## 2019-07-02 DIAGNOSIS — E1165 Type 2 diabetes mellitus with hyperglycemia: Secondary | ICD-10-CM

## 2019-07-02 DIAGNOSIS — I110 Hypertensive heart disease with heart failure: Secondary | ICD-10-CM | POA: Diagnosis not present

## 2019-07-02 NOTE — Progress Notes (Signed)
[facility] [place], [place] [address]  Internal MEDICINE  Telephone Visit  Patient Name: Larry Callahan  [id number]  [id number]  Date of Service: 07/10/2019  I connected with the patient at 1010 by telephone and verified the patients identity using two identifiers.   I discussed the limitations, risks, security and privacy concerns of performing an evaluation and management service by telephone and the availability of in person appointments. I also discussed with the patient that there may be a patient responsible charge related to the service.  The patient expressed understanding and agrees to proceed.    Chief Complaint  Patient presents with  . Telephone Assessment  . Telephone Screen  . Hospitalization Follow-up    fall, right hip pain   . Other    loss of taste, everything taste like salt water     HPI Pt is connected for routine follow up for ongoing medical problems. He also had a fall and MRI showed following changes  Negative for lumbar fracture. Multilevel degenerative changes above. Left nephrectomy. Enlarging cyst in the region of left adrenal gland, pt also has ventral hernia  compared with 2014. Will need follow up, has been getting PT for pain due to fall  Pt diabetic numbers are better, pt does get confused about his medications Recent hospital admission required a bronchoscopy which has been negative for any pathogens or malignancy   Sees cardiology for IHD   Current Medication: Outpatient Encounter Medications as of 07/02/2019  Medication Sig Note  . acetaminophen (TYLENOL) 325 MG tablet Take 2 tablets (650 mg total) by mouth every 4 (four) hours as needed for headache or mild pain.   Marland Kitchen albuterol (VENTOLIN HFA) 108 (90 Base) MCG/ACT inhaler Inhale 2 puffs into the lungs every 6 (six) hours as needed for wheezing or shortness of breath.   Marland Kitchen amLODipine (NORVASC) 10 MG tablet Take 1 tablet (10 mg total) by mouth daily.   . Blood  Glucose Calibration (TRUE METRIX LEVEL 1) Low SOLN Use as directed   . Blood Glucose Monitoring Suppl (TRUE METRIX AIR GLUCOSE METER) DEVI 1 Device by Does not apply route 2 (two) times daily.   . ferrous gluconate (FERGON) 324 MG tablet Take 1 tablet (324 mg total) by mouth daily with breakfast.   . furosemide (LASIX) 80 MG tablet Take 1 tablet (80 mg total) by mouth 2 (two) times daily.   Marland Kitchen gemfibrozil (LOPID) 600 MG tablet Take 1 tablet (600 mg total) by mouth 2 (two) times daily with a meal.   . glimepiride (AMARYL) 2 MG tablet Take 1 tablet (2 mg total) by mouth daily before breakfast.   . glucose blood (TRUE METRIX BLOOD GLUCOSE TEST) test strip Use as instructed twice daily diag E11.65   . guaiFENesin (MUCINEX) 600 MG 12 hr tablet Take 1 tablet (600 mg total) by mouth 2 (two) times daily.   . hydrALAZINE (APRESOLINE) 100 MG tablet Take 1 tablet (100 mg total) by mouth 3 (three) times daily.   Marland Kitchen ipratropium-albuterol (DUONEB) 0.5-2.5 (3) MG/3ML SOLN Take 3 mLs by nebulization every 6 (six) hours as needed.   . isosorbide mononitrate (IMDUR) 30 MG 24 hr tablet Take 1 tablet (30 mg total) by mouth 2 (two) times daily.   Marland Kitchen loratadine (CLARITIN) 10 MG tablet Take 1 tablet (10 mg total) by mouth daily. 05/16/2019: Pt takes differently: Take 1 tablet daily as needed.  . nitroGLYCERIN (NITROSTAT) 0.4 MG SL tablet Place 1 tablet (0.4 mg  total) under the tongue every 5 (five) minutes x 3 doses as needed for chest pain.   Marland Kitchen NOVOFINE 32G X 6 MM MISC To use with [place] injections daily   . oxyCODONE-acetaminophen (PERCOCET) 5-325 MG tablet Take 1 tablet by mouth every 4 (four) hours as needed for severe pain.   . polyethylene glycol (MIRALAX / GLYCOLAX) 17 g packet Take 17 g by mouth daily.   . potassium chloride SA (KLOR-CON) 20 MEQ tablet Take 1 tablet (20 mEq total) by mouth daily.   . sodium chloride (OCEAN) 0.65 % SOLN nasal spray Place 1 spray into both nostrils as needed for congestion.   Marland Kitchen tiotropium  (SPIRIVA HANDIHALER) 18 MCG inhalation capsule Place 1 capsule (18 mcg total) into inhaler and inhale daily.   . TRUEPLUS LANCETS 33G MISC Use as directed twice daily diag E11.65   . [DISCONTINUED] Fluticasone-Salmeterol (ADVAIR DISKUS) 250-50 MCG/DOSE AEPB Inhale 1 puff into the lungs 2 (two) times daily.   . [DISCONTINUED] metoprolol succinate (TOPROL-XL) 50 MG 24 hr tablet Take 1 tablet (50 mg total) by mouth at bedtime. Take with or immediately following a meal.   . [DISCONTINUED] omeprazole (PRILOSEC) 40 MG capsule Take 1 capsule (40 mg total) by mouth daily.   . Insulin Glargine, 1 Unit Dial, (TOUJEO SOLOSTAR) 300 UNIT/ML SOPN Inject 26 Units into the skin every evening. 05/16/2019: Pt states he takes insulin in the morning.  . [DISCONTINUED] azithromycin (ZITHROMAX) 500 MG tablet Take 1 tablet (500 mg total) by mouth daily. (Patient not taking: Reported on 07/02/2019)    No facility-administered encounter medications on file as of 07/02/2019.    Surgical History: Past Surgical History:  Procedure Laterality Date  . BACK SURGERY    . CHOLECYSTECTOMY    . COLONOSCOPY WITH PROPOFOL N/A 04/12/2019   Procedure: COLONOSCOPY WITH PROPOFOL;  Surgeon: [name], MD;  Location: [facility] ENDOSCOPY;  Service: Gastroenterology;  Laterality: N/A;  . ESOPHAGOGASTRODUODENOSCOPY N/A 04/12/2019   Procedure: ESOPHAGOGASTRODUODENOSCOPY (EGD);  Surgeon: [name], MD;  Location: [facility] ENDOSCOPY;  Service: Gastroenterology;  Laterality: N/A;  . FLEXIBLE BRONCHOSCOPY Bilateral 05/17/2019   Procedure: FLEXIBLE BRONCHOSCOPY;  Surgeon: [name], MD;  Location: [name] ORS;  Service: Pulmonary;  Laterality: Bilateral;  . left arm surgery    . nephrectomy Left   . PARATHYROIDECTOMY    . RIGHT HEART CATH N/A 03/29/2019   Procedure: RIGHT HEART CATH;  Surgeon: [name], MD;  Location: [facility] CV LAB;  Service: Cardiovascular;  Laterality: N/A;    Medical History: Past Medical  History:  Diagnosis Date  . Allergy   . Coronary artery disease   . Diabetes mellitus without complication ([place])   . Diastolic CHF ([place])   . GERD (gastroesophageal reflux disease)   . Hyperlipidemia   . Hypertension   . Renal insufficiency   . Sleep apnea     Family History: Family History  Problem Relation Age of Onset  . Diabetes Mother   . Lung cancer Father   . Diabetes Sister   . Hypertension Sister   . Heart disease Sister   . Cancer Sister   . Bone cancer Brother     Social History   Socioeconomic History  . Marital status: Single    Spouse name: Not on file  . Number of children: Not on file  . Years of education: Not on file  . Highest education level: Not on file  Occupational History  . Not on file  Tobacco Use  .  Smoking status: Former Research scientist (life sciences)  . Smokeless tobacco: Former Systems developer    Types: Chew  Substance and Sexual Activity  . Alcohol use: No  . Drug use: No  . Sexual activity: Not on file  Other Topics Concern  . Not on file  Social History Narrative  . Not on file   Social Determinants of Health   Financial Resource Strain:   . Difficulty of Paying Living Expenses: Not on file  Food Insecurity:   . Worried About Charity fundraiser in the Last Year: Not on file  . Ran Out of Food in the Last Year: Not on file  Transportation Needs:   . Lack of Transportation (Medical): Not on file  . Lack of Transportation (Non-Medical): Not on file  Physical Activity:   . Days of Exercise per Week: Not on file  . Minutes of Exercise per Session: Not on file  Stress:   . Feeling of Stress : Not on file  Social Connections:   . Frequency of Communication with Friends and Family: Not on file  . Frequency of Social Gatherings with Friends and Family: Not on file  . Attends Religious Services: Not on file  . Active Member of Clubs or Organizations: Not on file  . Attends Archivist Meetings: Not on file  . Marital Status: Not on file  Intimate  Partner Violence:   . Fear of Current or Ex-Partner: Not on file  . Emotionally Abused: Not on file  . Physically Abused: Not on file  . Sexually Abused: Not on file    Review of Systems  Constitutional: Negative for chills, fatigue and unexpected weight change.  HENT: Positive for postnasal drip. Negative for congestion, rhinorrhea, sneezing and sore throat.   Eyes: Negative for redness.  Respiratory: Negative for cough, chest tightness and shortness of breath.   Cardiovascular: Negative for chest pain and palpitations.  Gastrointestinal: Negative for abdominal pain, constipation, diarrhea, nausea and vomiting.       Hernia   Genitourinary: Negative for dysuria and frequency.  Musculoskeletal: Negative for arthralgias, back pain, joint swelling and neck pain.  Skin: Negative for rash.  Neurological: Negative.  Negative for tremors and numbness.  Hematological: Negative for adenopathy. Does not bruise/bleed easily.  Psychiatric/Behavioral: Negative for behavioral problems (Depression), sleep disturbance and suicidal ideas. The patient is not nervous/anxious.     Vital Signs: Ht _0  (1.803 m)   BMI 30.91 kg/m    Observation/Objective: NAD, concerned about hernia, he is not aware of adrenal adenoma   Assessment/Plan: 1. 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 Uncontrolled type 2 diabetes mellitus with hyperglycemia ([place]) - Controlled, pt has home health nurse   3. OSA on CPAP - Continue CPAP  4. Acute on chronic heart failure with preserved ejection fraction (HFpEF) ([place]) - Pt is seen by cardiology   5. Ventral hernia without obstruction or gangrene - Pt is concerned about his hernia and he needs further evaluation  - Ambulatory referral to General Surgery  General Counseling: Pattricia Boss understanding of the findings of today's phone visit and  agrees with plan of treatment. I have discussed any further diagnostic evaluation that may be needed or ordered today. We also reviewed his medications today. he has been encouraged to call the office with any questions or concerns that should arise related to todays visit.  Orders Placed This Encounter  Procedures  .  Ambulatory referral to General Surgery    Time spent: 25 Minutes    Dr Lavera Guise Internal medicine

## 2019-07-02 NOTE — Telephone Encounter (Signed)
lmov to schedule  °

## 2019-07-02 NOTE — Telephone Encounter (Signed)
-----   Message from Clarisse Gouge sent at 06/04/2019 12:17 PM EST ----- Regarding: FW: appt Lmov to schedule.  ----- Message ----- From: Clarisse Gouge Sent: 05/31/2019   1:49 PM EST To: Valora Corporal, RN, [facility] Scheduling Subject: appt                                           Lmov to reschedule.  ----- Message ----- From: Valora Corporal, RN Sent: 05/31/2019  12:58 PM EST To: Nile Dear, EMT, [facility] Scheduling  I was off so didn't see this message until now. Did he get a ride?  ----- Message ----- From: Nile Dear, EMT Sent: 05/30/2019   6:07 PM EST To: Valora Corporal, RN  Jeneen Rinks does not have a ride to see Sharolyn Douglas tomorrow at 10:45.  Im off tomorrow, could you let them know and he needs to be rescheduled also.    [employer]

## 2019-07-04 ENCOUNTER — Telehealth (HOSPITAL_COMMUNITY): Payer: Self-pay

## 2019-07-04 DIAGNOSIS — I110 Hypertensive heart disease with heart failure: Secondary | ICD-10-CM | POA: Diagnosis not present

## 2019-07-04 DIAGNOSIS — G4733 Obstructive sleep apnea (adult) (pediatric): Secondary | ICD-10-CM | POA: Diagnosis not present

## 2019-07-04 DIAGNOSIS — J441 Chronic obstructive pulmonary disease with (acute) exacerbation: Secondary | ICD-10-CM | POA: Diagnosis not present

## 2019-07-04 DIAGNOSIS — E1122 Type 2 diabetes mellitus with diabetic chronic kidney disease: Secondary | ICD-10-CM | POA: Diagnosis not present

## 2019-07-04 DIAGNOSIS — N183 Chronic kidney disease, stage 3 unspecified: Secondary | ICD-10-CM | POA: Diagnosis not present

## 2019-07-04 DIAGNOSIS — E1142 Type 2 diabetes mellitus with diabetic polyneuropathy: Secondary | ICD-10-CM | POA: Diagnosis not present

## 2019-07-04 DIAGNOSIS — M160 Bilateral primary osteoarthritis of hip: Secondary | ICD-10-CM | POA: Diagnosis not present

## 2019-07-04 DIAGNOSIS — I2510 Atherosclerotic heart disease of native coronary artery without angina pectoris: Secondary | ICD-10-CM | POA: Diagnosis not present

## 2019-07-04 DIAGNOSIS — I5030 Unspecified diastolic (congestive) heart failure: Secondary | ICD-10-CM | POA: Diagnosis not present

## 2019-07-04 NOTE — Telephone Encounter (Signed)
Today had a telephone appt with Jeneen Rinks.  He states doing much better.  Been doing his exercises PT showed him.  He is getting around better.  Denies any problems today, denies chest pain, breathing problems, headaches or dizziness.  He states bought a new mattress to help him getting a crick in his neck.  He states helping him sleep better.  He is aware of up coming appts.  Discussed diet and fluids.  He has all his medications and verified them.  Will continue to visit for heart failure.   [place] [phone]

## 2019-07-08 DIAGNOSIS — N183 Chronic kidney disease, stage 3 unspecified: Secondary | ICD-10-CM | POA: Diagnosis not present

## 2019-07-08 DIAGNOSIS — J441 Chronic obstructive pulmonary disease with (acute) exacerbation: Secondary | ICD-10-CM | POA: Diagnosis not present

## 2019-07-08 DIAGNOSIS — E1142 Type 2 diabetes mellitus with diabetic polyneuropathy: Secondary | ICD-10-CM | POA: Diagnosis not present

## 2019-07-08 DIAGNOSIS — I110 Hypertensive heart disease with heart failure: Secondary | ICD-10-CM | POA: Diagnosis not present

## 2019-07-08 DIAGNOSIS — E1122 Type 2 diabetes mellitus with diabetic chronic kidney disease: Secondary | ICD-10-CM | POA: Diagnosis not present

## 2019-07-08 DIAGNOSIS — G4733 Obstructive sleep apnea (adult) (pediatric): Secondary | ICD-10-CM | POA: Diagnosis not present

## 2019-07-08 DIAGNOSIS — M160 Bilateral primary osteoarthritis of hip: Secondary | ICD-10-CM | POA: Diagnosis not present

## 2019-07-08 DIAGNOSIS — I5030 Unspecified diastolic (congestive) heart failure: Secondary | ICD-10-CM | POA: Diagnosis not present

## 2019-07-08 DIAGNOSIS — I2510 Atherosclerotic heart disease of native coronary artery without angina pectoris: Secondary | ICD-10-CM | POA: Diagnosis not present

## 2019-07-09 ENCOUNTER — Telehealth: Payer: Self-pay

## 2019-07-09 ENCOUNTER — Telehealth (HOSPITAL_COMMUNITY): Payer: Self-pay

## 2019-07-09 NOTE — Telephone Encounter (Signed)
Larry Callahan contacted me and advised he vomited once with little blood in it.  He states he has a hernia at his belly button that is hurting.  He states does not want to go to hospital.  Contacted Dr Junius Creamer office and they advised they are working getting scheduled with a Psychologist, sport and exercise and will be calling him.  They spoke to him by telehealth last week and aware of hernia.  Their office has appt with him on Thursday office and will check with him on that also.  Larry Callahan same and he appeared to understand.  He states feels ok just has pain in hernia.  He states once he vomited he felt good.    [place] [phone]

## 2019-07-09 NOTE — Telephone Encounter (Signed)
Confirmed telephone visit with patients sister. klh

## 2019-07-10 ENCOUNTER — Other Ambulatory Visit: Payer: Self-pay

## 2019-07-10 ENCOUNTER — Other Ambulatory Visit: Payer: Self-pay | Admitting: Internal Medicine

## 2019-07-10 DIAGNOSIS — I2510 Atherosclerotic heart disease of native coronary artery without angina pectoris: Secondary | ICD-10-CM | POA: Diagnosis not present

## 2019-07-10 DIAGNOSIS — G4733 Obstructive sleep apnea (adult) (pediatric): Secondary | ICD-10-CM | POA: Diagnosis not present

## 2019-07-10 DIAGNOSIS — E1122 Type 2 diabetes mellitus with diabetic chronic kidney disease: Secondary | ICD-10-CM | POA: Diagnosis not present

## 2019-07-10 DIAGNOSIS — M160 Bilateral primary osteoarthritis of hip: Secondary | ICD-10-CM | POA: Diagnosis not present

## 2019-07-10 DIAGNOSIS — E1142 Type 2 diabetes mellitus with diabetic polyneuropathy: Secondary | ICD-10-CM | POA: Diagnosis not present

## 2019-07-10 DIAGNOSIS — I110 Hypertensive heart disease with heart failure: Secondary | ICD-10-CM | POA: Diagnosis not present

## 2019-07-10 DIAGNOSIS — J441 Chronic obstructive pulmonary disease with (acute) exacerbation: Secondary | ICD-10-CM | POA: Diagnosis not present

## 2019-07-10 DIAGNOSIS — N183 Chronic kidney disease, stage 3 unspecified: Secondary | ICD-10-CM | POA: Diagnosis not present

## 2019-07-10 DIAGNOSIS — I5030 Unspecified diastolic (congestive) heart failure: Secondary | ICD-10-CM | POA: Diagnosis not present

## 2019-07-10 MED ORDER — METOPROLOL SUCCINATE ER 50 MG PO TB24: 50.0000 mg | ORAL_TABLET | Freq: Every day | ORAL | 1 refills | Status: DC

## 2019-07-11 ENCOUNTER — Other Ambulatory Visit: Payer: Self-pay

## 2019-07-11 ENCOUNTER — Ambulatory Visit (INDEPENDENT_AMBULATORY_CARE_PROVIDER_SITE_OTHER): Payer: Medicare PPO | Admitting: Internal Medicine

## 2019-07-11 ENCOUNTER — Encounter: Payer: Self-pay | Admitting: Internal Medicine

## 2019-07-11 VITALS — Ht 71.0 in

## 2019-07-11 DIAGNOSIS — R042 Hemoptysis: Secondary | ICD-10-CM

## 2019-07-11 DIAGNOSIS — I5022 Chronic systolic (congestive) heart failure: Secondary | ICD-10-CM

## 2019-07-11 DIAGNOSIS — J301 Allergic rhinitis due to pollen: Secondary | ICD-10-CM | POA: Diagnosis not present

## 2019-07-11 DIAGNOSIS — J449 Chronic obstructive pulmonary disease, unspecified: Secondary | ICD-10-CM | POA: Diagnosis not present

## 2019-07-11 NOTE — Progress Notes (Signed)
[facility] [place], [place] [address]  Pulmonary Sleep Medicine   Office Visit Note  Patient Name: Larry Callahan DOB: [birth year] MRN [id number]  Date of Service: 07/11/2019  Complaints/HPI: Patient is here because of hemoptysis.  He has been having on and off hemoptysis he states going on for some time.  When I asked him to actually bring a specimen of the hemoptysis he really has not been able to.  Back in December we actually did a bronchoscopy on him and his airways were totally clear.  At that time he had been admitted to the hospital because of the complaint of hemoptysis.  Right now he denies having any chest pain no palpitations.  States he is having streaky blood.  He has had an extensive work-up including CT scans of the chest which did not show any acute disease.  Patient has had follow-up x-rays also.  These have also shown no significant disease.  ROS  General: (-) fever, (-) chills, (-) night sweats, (-) weakness Skin: (-) rashes, (-) itching,. Eyes: (-) visual changes, (-) redness, (-) itching. Nose and Sinuses: (-) nasal stuffiness or itchiness, (-) postnasal drip, (-) nosebleeds, (-) sinus trouble. Mouth and Throat: (-) sore throat, (-) hoarseness. Neck: (-) swollen glands, (-) enlarged thyroid, (-) neck pain. Respiratory: + cough, (-) bloody sputum, + shortness of breath, + wheezing. Cardiovascular: - ankle swelling, (-) chest pain. Lymphatic: (-) lymph node enlargement. Neurologic: (-) numbness, (-) tingling. Psychiatric: (-) anxiety, (-) depression   Current Medication: Outpatient Encounter Medications as of 07/11/2019  Medication Sig Note  . acetaminophen (TYLENOL) 325 MG tablet Take 2 tablets (650 mg total) by mouth every 4 (four) hours as needed for headache or mild pain.   Marland Kitchen albuterol (VENTOLIN HFA) 108 (90 Base) MCG/ACT inhaler Inhale 2 puffs into the lungs every 6 (six) hours as needed for wheezing or shortness of breath.    Marland Kitchen amLODipine (NORVASC) 10 MG tablet Take 1 tablet (10 mg total) by mouth daily.   . Blood Glucose Calibration (TRUE METRIX LEVEL 1) Low SOLN Use as directed   . Blood Glucose Monitoring Suppl (TRUE METRIX AIR GLUCOSE METER) DEVI 1 Device by Does not apply route 2 (two) times daily.   . ferrous gluconate (FERGON) 324 MG tablet Take 1 tablet (324 mg total) by mouth daily with breakfast.   . furosemide (LASIX) 80 MG tablet Take 1 tablet (80 mg total) by mouth 2 (two) times daily.   Marland Kitchen gemfibrozil (LOPID) 600 MG tablet Take 1 tablet (600 mg total) by mouth 2 (two) times daily with a meal.   . glimepiride (AMARYL) 2 MG tablet Take 1 tablet (2 mg total) by mouth daily before breakfast.   . glucose blood (TRUE METRIX BLOOD GLUCOSE TEST) test strip Use as instructed twice daily diag E11.65   . guaiFENesin (MUCINEX) 600 MG 12 hr tablet Take 1 tablet (600 mg total) by mouth 2 (two) times daily.   . hydrALAZINE (APRESOLINE) 100 MG tablet Take 1 tablet (100 mg total) by mouth 3 (three) times daily.   Marland Kitchen ipratropium-albuterol (DUONEB) 0.5-2.5 (3) MG/3ML SOLN Take 3 mLs by nebulization every 6 (six) hours as needed.   . isosorbide mononitrate (IMDUR) 30 MG 24 hr tablet Take 1 tablet (30 mg total) by mouth 2 (two) times daily.   Marland Kitchen loratadine (CLARITIN) 10 MG tablet Take 1 tablet (10 mg total) by mouth daily. 05/16/2019: Pt takes differently: Take 1 tablet daily as needed.  Marland Kitchen  metoprolol succinate (TOPROL-XL) 50 MG 24 hr tablet Take 1 tablet (50 mg total) by mouth at bedtime. Take with or immediately following a meal.   . nitroGLYCERIN (NITROSTAT) 0.4 MG SL tablet Place 1 tablet (0.4 mg total) under the tongue every 5 (five) minutes x 3 doses as needed for chest pain.   Marland Kitchen NOVOFINE 32G X 6 MM MISC To use with [place] injections daily   . omeprazole (PRILOSEC) 40 MG capsule 1 cap daily   . oxyCODONE-acetaminophen (PERCOCET) 5-325 MG tablet Take 1 tablet by mouth every 4 (four) hours as needed for severe pain.   .  polyethylene glycol (MIRALAX / GLYCOLAX) 17 g packet Take 17 g by mouth daily.   . potassium chloride SA (KLOR-CON) 20 MEQ tablet Take 1 tablet (20 mEq total) by mouth daily.   . sodium chloride (OCEAN) 0.65 % SOLN nasal spray Place 1 spray into both nostrils as needed for congestion.   Marland Kitchen tiotropium (SPIRIVA HANDIHALER) 18 MCG inhalation capsule Place 1 capsule (18 mcg total) into inhaler and inhale daily.   . TRUEPLUS LANCETS 33G MISC Use as directed twice daily diag E11.65   . Insulin Glargine, 1 Unit Dial, (TOUJEO SOLOSTAR) 300 UNIT/ML SOPN Inject 26 Units into the skin every evening. 05/16/2019: Pt states he takes insulin in the morning.   No facility-administered encounter medications on file as of 07/11/2019.    Surgical History: Past Surgical History:  Procedure Laterality Date  . BACK SURGERY    . CHOLECYSTECTOMY    . COLONOSCOPY WITH PROPOFOL N/A 04/12/2019   Procedure: COLONOSCOPY WITH PROPOFOL;  Surgeon: [name], MD;  Location: [facility] ENDOSCOPY;  Service: Gastroenterology;  Laterality: N/A;  . ESOPHAGOGASTRODUODENOSCOPY N/A 04/12/2019   Procedure: ESOPHAGOGASTRODUODENOSCOPY (EGD);  Surgeon: [name], MD;  Location: [facility] ENDOSCOPY;  Service: Gastroenterology;  Laterality: N/A;  . FLEXIBLE BRONCHOSCOPY Bilateral 05/17/2019   Procedure: FLEXIBLE BRONCHOSCOPY;  Surgeon: [name], MD;  Location: [name] ORS;  Service: Pulmonary;  Laterality: Bilateral;  . left arm surgery    . nephrectomy Left   . PARATHYROIDECTOMY    . RIGHT HEART CATH N/A 03/29/2019   Procedure: RIGHT HEART CATH;  Surgeon: [name], MD;  Location: [facility] LAB;  Service: Cardiovascular;  Laterality: N/A;    Medical History: Past Medical History:  Diagnosis Date  . Allergy   . Coronary artery disease   . Diabetes mellitus without complication ([place])   . Diastolic CHF ([place])   . GERD (gastroesophageal reflux disease)   . Hyperlipidemia   . Hypertension   . Renal  insufficiency   . Sleep apnea     Family History: Family History  Problem Relation Age of Onset  . Diabetes Mother   . Lung cancer Father   . Diabetes Sister   . Hypertension Sister   . Heart disease Sister   . Cancer Sister   . Bone cancer Brother     Social History: Social History   Socioeconomic History  . Marital status: Single    Spouse name: Not on file  . Number of children: Not on file  . Years of education: Not on file  . Highest education level: Not on file  Occupational History  . Not on file  Tobacco Use  . Smoking status: Former Smoker    Types: Cigarettes  . Smokeless tobacco: Former Systems developer    Types: Chew  Substance and Sexual Activity  . Alcohol use: No  . Drug use: No  .  Sexual activity: Not on file  Other Topics Concern  . Not on file  Social History Narrative  . Not on file   Social Determinants of Health   Financial Resource Strain:   . Difficulty of Paying Living Expenses: Not on file  Food Insecurity:   . Worried About Charity fundraiser in the Last Year: Not on file  . Ran Out of Food in the Last Year: Not on file  Transportation Needs:   . Lack of Transportation (Medical): Not on file  . Lack of Transportation (Non-Medical): Not on file  Physical Activity:   . Days of Exercise per Week: Not on file  . Minutes of Exercise per Session: Not on file  Stress:   . Feeling of Stress : Not on file  Social Connections:   . Frequency of Communication with Friends and Family: Not on file  . Frequency of Social Gatherings with Friends and Family: Not on file  . Attends Religious Services: Not on file  . Active Member of Clubs or Organizations: Not on file  . Attends Archivist Meetings: Not on file  . Marital Status: Not on file  Intimate Partner Violence:   . Fear of Current or Ex-Partner: Not on file  . Emotionally Abused: Not on file  . Physically Abused: Not on file  . Sexually Abused: Not on file    Vital Signs: Height 5'  11" (1.803 m).  Examination: General Appearance: The patient is well-developed, well-nourished, and in no distress. Skin: Gross inspection of skin unremarkable. Head: normocephalic, no gross deformities. Eyes: no gross deformities noted. ENT: ears appear grossly normal no exudates. Neck: Supple. No thyromegaly. No LAD. Respiratory: No rhonchi or rales are noted at this time. Cardiovascular: Normal S1 and S2 without murmur or rub. Extremities: No cyanosis. pulses are equal. Neurologic: Alert and oriented. No involuntary movements.  LABS: Recent Results (from the past 2160 hour(s))  Surgical pathology     Status: None   Collection Time: 04/12/19  2:55 PM  Result Value Ref Range   SURGICAL PATHOLOGY      SURGICAL PATHOLOGY CASE: [id number] PATIENT: Waymond Cera Surgical Pathology Report     Specimen Submitted: A. Duodenum; CBXs B. Stomach, random; CBXs C. GEJ, salmon colored mucosa; CBXs D. Colon polyp, sigmoid; cold snare  Clinical History: Iron def anemia, rectal bleeding, hiatal hernia, salmon colored mucosa, sigmoid diverticulosis, sigmoid polyp.      DIAGNOSIS: A. 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 - NEGATIVE FOR FEATURES OF CELIAC DISEASE. - NEGATIVE FOR DYSPLASIA AND MALIGNANCY.  B. 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 - NEGATIVE FOR HELICOBACTER PYLORI. - NEGATIVE FOR DYSPLASIA AND MALIGNANCY.  C. GEJ, COLD BIOPSY: - SQUAMOCOLUMNAR JUNCTION WITH FEATURES SUGGESTIVE OF REFLUX ESOPHAGITIS. - NEGATIVE FOR INTESTINAL METAPLASIA, DYSPLASIA, AND MALIGNANCY.  D. COLON POLYP, SIGMOID; COLD SNARE: - HYPERPLASTIC POLYP. - NEGATIVE  FOR DYSPLASIA AND MALIGNANCY.  Comment: Immunohistochemical stain for H pylori (block B1) is negative.  IHC slides were prepared by Launa Grill, [place]. All controls stained appropriately.  This test was  developed and its performance characteristics determined by [facility]. It has not been cleared or approved by the [name] Food and Drug Administration. The FDA does not require this test to go through premarket FDA review. This test is used for clinical purposes. It should not be regarded as investigational or for research. This laboratory is certified under the Clinical Laboratory Improvement  Amendments (CLIA) as qualified to perform high complexity clinical laboratory testing.  GROSS DESCRIPTION: A. Labeled: C BX duodenal Received: In formalin Tissue fragment(s): 4 Size: From 0.2-0.3 cm Description: Tan soft tissue fragments Entirely submitted in 1 cassette.  B. Labeled: Random gastric C BX Received: In formalin Tissue fragment(s): 4 Size: From 0.2-0.4 cm Desc ription: Tan soft tissue fragments Entirely submitted in 1 cassette.  C. Labeled: GE junction Salmon-colored mucosa C BX Received: In formalin Tissue fragment(s): 3 Size: 0.1-0.2 cm Description: Tan soft tissue fragments Entirely submitted in 1 cassette.  D. Labeled: Cold snare sigmoid polyp Received: In formalin Tissue fragment(s): 2 tan soft tissue fragments Size: 0.2 and 0.5 cm Description: Tan soft tissue fragments Entirely submitted in 1 cassette.   Final Diagnosis performed by Betsy Pries, MD.   Electronically signed 04/17/2019 9:10:51AM The electronic signature indicates that the named Attending Pathologist has evaluated the specimen Technical component performed at [facility], [address], [place], [place] [address] Lab: [phone] Dir: Rush Farmer, MD, MMM  Professional component performed at [name], [facility], [place], [place], [place] [address] Lab: [phone] Dir: Dellia Nims. Rubinas, MD   Glucose, capillary     Status: Abnormal   Collection Time: 04/12/19  5:04 PM  Result Value Ref Range   Glucose-Capillary 150 (H) 70 - 99 mg/dL  Glucose, capillary     Status: Abnormal    Collection Time: 04/12/19  9:46 PM  Result Value Ref Range   Glucose-Capillary 384 (H) 70 - 99 mg/dL   Comment 1 Notify RN    Comment 2 Document in Chart   Basic metabolic panel     Status: Abnormal   Collection Time: 04/13/19  6:06 AM  Result Value Ref Range   Sodium 141 135 - 145 mmol/L   Potassium 3.6 3.5 - 5.1 mmol/L   Chloride 109 98 - 111 mmol/L   CO2 24 22 - 32 mmol/L   Glucose, Bld 124 (H) 70 - 99 mg/dL   BUN 44 (H) 8 - 23 mg/dL   Creatinine, Ser 2.19 (H) 0.61 - 1.24 mg/dL   Calcium 8.9 8.9 - 10.3 mg/dL   GFR calc non Af Amer 30 (L) >60 mL/min   GFR calc Af Amer 35 (L) >60 mL/min   Anion gap 8 5 - 15    Comment: Performed at [facility], [address] [place], [place] [address]  Glucose, capillary     Status: Abnormal   Collection Time: 04/13/19  8:25 AM  Result Value Ref Range   Glucose-Capillary 156 (H) 70 - 99 mg/dL  Glucose, capillary     Status: Abnormal   Collection Time: 04/13/19 12:27 PM  Result Value Ref Range   Glucose-Capillary 190 (H) 70 - 99 mg/dL  Comprehensive metabolic panel     Status: Abnormal   Collection Time: 04/23/19  3:15 PM  Result Value Ref Range   Sodium 143 135 - 145 mmol/L   Potassium 3.0 (L) 3.5 - 5.1 mmol/L   Chloride 107 98 - 111 mmol/L   CO2 23 22 - 32 mmol/L   Glucose, Bld 96 70 - 99 mg/dL   BUN 45 (H) 8 - 23 mg/dL   Creatinine, Ser 2.01 (H) 0.61 - 1.24 mg/dL   Calcium 9.6 8.9 - 10.3 mg/dL   Total Protein 7.3 6.5 - 8.1 g/dL   Albumin 3.3 (L) 3.5 - 5.0 g/dL   AST 22 15 - 41 U/L   ALT 18 0 - 44 U/L  Alkaline Phosphatase 58 38 - 126 U/L   Total Bilirubin 0.5 0.3 - 1.2 mg/dL   GFR calc non Af Amer 34 (L) >60 mL/min   GFR calc Af Amer 39 (L) >60 mL/min   Anion gap 13 5 - 15    Comment: Performed at [facility], [address] [place], [place] [address]  CBC     Status: Abnormal   Collection Time: 04/23/19  3:15 PM  Result Value Ref Range   WBC 8.3 4.0 - 10.5 K/uL   RBC 4.02 (L) 4.22 - 5.81 MIL/uL    Hemoglobin 10.6 (L) 13.0 - 17.0 g/dL   HCT 34.1 (L) 39.0 - 52.0 %   MCV 84.8 80.0 - 100.0 fL   MCH 26.4 26.0 - 34.0 pg   MCHC 31.1 30.0 - 36.0 g/dL   RDW 17.9 (H) 11.5 - 15.5 %   Platelets 255 150 - 400 K/uL   nRBC 0.0 0.0 - 0.2 %    Comment: Performed at [facility], [address] [place], [place] [address]  Type and screen [place]     Status: None   Collection Time: 04/23/19  3:15 PM  Result Value Ref Range   ABO/RH(D) A POS    Antibody Screen NEG    Sample Expiration      04/26/2019,2359 Performed at [facility], [address] [place], [place] [address]   Urinalysis, Complete w Microscopic     Status: Abnormal   Collection Time: 04/23/19  6:52 PM  Result Value Ref Range   Color, Urine YELLOW (A) YELLOW   APPearance CLEAR (A) CLEAR   Specific Gravity, Urine 1.011 1.005 - 1.030   pH 5.0 5.0 - 8.0   Glucose, UA NEGATIVE NEGATIVE mg/dL   Hgb urine dipstick NEGATIVE NEGATIVE   Bilirubin Urine NEGATIVE NEGATIVE   Ketones, ur NEGATIVE NEGATIVE mg/dL   Protein, ur 100 (A) NEGATIVE mg/dL   Nitrite NEGATIVE NEGATIVE   Leukocytes,Ua NEGATIVE NEGATIVE   RBC / HPF 0-5 0 - 5 RBC/hpf   WBC, UA 0-5 0 - 5 WBC/hpf   Bacteria, UA RARE (A) NONE SEEN   Squamous Epithelial / LPF NONE SEEN 0 - 5    Comment: Performed at [facility], [address] [place], [place] [address]  CBC     Status: Abnormal   Collection Time: 04/24/19 11:22 AM  Result Value Ref Range   WBC 8.2 4.0 - 10.5 K/uL   RBC 4.16 (L) 4.22 - 5.81 MIL/uL   Hemoglobin 11.0 (L) 13.0 - 17.0 g/dL   HCT 35.3 (L) 39.0 - 52.0 %   MCV 84.9 80.0 - 100.0 fL   MCH 26.4 26.0 - 34.0 pg   MCHC 31.2 30.0 - 36.0 g/dL   RDW 17.9 (H) 11.5 - 15.5 %   Platelets 238 150 - 400 K/uL   nRBC 0.0 0.0 - 0.2 %    Comment: Performed at [facility], [address] [place], [place] [address]  Basic metabolic panel     Status: Abnormal   Collection Time: 04/24/19 11:22 AM   Result Value Ref Range   Sodium 141 135 - 145 mmol/L   Potassium 3.2 (L) 3.5 - 5.1 mmol/L   Chloride 105 98 - 111 mmol/L   CO2 24 22 - 32 mmol/L   Glucose, Bld 114 (H) 70 - 99 mg/dL   BUN 43 (H) 8 - 23 mg/dL   Creatinine, Ser 2.22 (H) 0.61 -  1.24 mg/dL   Calcium 9.5 8.9 - 10.3 mg/dL   GFR calc non Af Amer 30 (L) >60 mL/min   GFR calc Af Amer 35 (L) >60 mL/min   Anion gap 12 5 - 15    Comment: Performed at [facility], [address] [place], [place] [address]  Brain natriuretic peptide     Status: None   Collection Time: 04/24/19 11:22 AM  Result Value Ref Range   B Natriuretic Peptide 88.0 0.0 - 100.0 pg/mL    Comment: Performed at [facility], [place], [place] [address]  Troponin I (High Sensitivity)     Status: Abnormal   Collection Time: 04/24/19 11:22 AM  Result Value Ref Range   Troponin I (High Sensitivity) 21 (H) <18 ng/L    Comment: (NOTE) Elevated high sensitivity troponin I (hsTnI) values and significant  changes across serial measurements may suggest ACS but many other  chronic and acute conditions are known to elevate hsTnI results.  Refer to the "Links" section for chest pain algorithms and additional  guidance. Performed at [facility], [place], [place] [address]   SARS CORONAVIRUS 2 (TAT 6-24 HRS) Nasopharyngeal Nasopharyngeal Swab     Status: None   Collection Time: 04/24/19  4:13 PM   Specimen: Nasopharyngeal Swab  Result Value Ref Range   SARS Coronavirus 2 NEGATIVE NEGATIVE    Comment: (NOTE) SARS-CoV-2 target nucleic acids are NOT DETECTED. The SARS-CoV-2 RNA is generally detectable in upper and lower respiratory specimens during the acute phase of infection. Negative results do not preclude SARS-CoV-2 infection, do not rule out co-infections with other pathogens, and should not be used as the sole basis for treatment or other patient management decisions. Negative results must be  combined with clinical observations, patient history, and epidemiological information. The expected result is Negative. Fact Sheet for Patients: SugarRoll.be Fact Sheet for Healthcare Providers: https://www.woods-mathews.com/ This test is not yet approved or cleared by the [place] FDA and  has been authorized for detection and/or diagnosis of SARS-CoV-2 by FDA under an Emergency Use Authorization (EUA). This EUA will remain  in effect (meaning this test can be used) for the duration of the COVID-19 declaration under Section 56 4(b)(1) of the Act, 21 U.S.C. section 360bbb-3(b)(1), unless the authorization is terminated or revoked sooner. Performed at [facility], [place] [address] [place], [place] [address]   Glucose, capillary     Status: Abnormal   Collection Time: 04/24/19  9:02 PM  Result Value Ref Range   Glucose-Capillary 217 (H) 70 - 99 mg/dL  Basic metabolic panel     Status: Abnormal   Collection Time: 04/25/19  4:43 AM  Result Value Ref Range   Sodium 140 135 - 145 mmol/L   Potassium 3.7 3.5 - 5.1 mmol/L   Chloride 107 98 - 111 mmol/L   CO2 25 22 - 32 mmol/L   Glucose, Bld 103 (H) 70 - 99 mg/dL   BUN 44 (H) 8 - 23 mg/dL   Creatinine, Ser 2.23 (H) 0.61 - 1.24 mg/dL   Calcium 8.8 (L) 8.9 - 10.3 mg/dL   GFR calc non Af Amer 30 (L) >60 mL/min   GFR calc Af Amer 35 (L) >60 mL/min   Anion gap 8 5 - 15    Comment: Performed at [facility], [address] [place],  [address]  Glucose, capillary     Status: None   Collection Time: 04/25/19  8:06 AM  Result Value Ref Range   Glucose-Capillary 78 70 - 99 mg/dL  Glucose, capillary     Status: Abnormal   Collection Time: 04/25/19 11:53 AM  Result Value Ref Range   Glucose-Capillary 254 (H) 70 - 99 mg/dL  Glucose, capillary     Status: Abnormal   Collection Time: 04/25/19  4:20 PM  Result Value Ref Range   Glucose-Capillary 195 (H) 70 - 99 mg/dL  Glucose,  capillary     Status: Abnormal   Collection Time: 04/25/19  9:04 PM  Result Value Ref Range   Glucose-Capillary 232 (H) 70 - 99 mg/dL  Basic metabolic panel     Status: Abnormal   Collection Time: 04/26/19  5:52 AM  Result Value Ref Range   Sodium 140 135 - 145 mmol/L   Potassium 3.5 3.5 - 5.1 mmol/L   Chloride 106 98 - 111 mmol/L   CO2 24 22 - 32 mmol/L   Glucose, Bld 146 (H) 70 - 99 mg/dL   BUN 43 (H) 8 - 23 mg/dL   Creatinine, Ser 2.12 (H) 0.61 - 1.24 mg/dL   Calcium 8.6 (L) 8.9 - 10.3 mg/dL   GFR calc non Af Amer 32 (L) >60 mL/min   GFR calc Af Amer 37 (L) >60 mL/min   Anion gap 10 5 - 15    Comment: Performed at [facility], [address] [place], [place] [address]  CBC Tomorrow     Status: Abnormal   Collection Time: 04/26/19  5:52 AM  Result Value Ref Range   WBC 5.8 4.0 - 10.5 K/uL   RBC 3.58 (L) 4.22 - 5.81 MIL/uL   Hemoglobin 9.6 (L) 13.0 - 17.0 g/dL   HCT 30.8 (L) 39.0 - 52.0 %   MCV 86.0 80.0 - 100.0 fL   MCH 26.8 26.0 - 34.0 pg   MCHC 31.2 30.0 - 36.0 g/dL   RDW 17.4 (H) 11.5 - 15.5 %   Platelets 190 150 - 400 K/uL   nRBC 0.0 0.0 - 0.2 %    Comment: Performed at [facility], [address] [place], [place] [address]  Glucose, capillary     Status: Abnormal   Collection Time: 04/26/19  8:08 AM  Result Value Ref Range   Glucose-Capillary 111 (H) 70 - 99 mg/dL   Comment 1 Notify RN    Comment 2 Document in Chart   Glucose, capillary     Status: Abnormal   Collection Time: 04/26/19 11:49 AM  Result Value Ref Range   Glucose-Capillary 231 (H) 70 - 99 mg/dL   Comment 1 Notify RN    Comment 2 Document in Chart   Glucose, capillary     Status: Abnormal   Collection Time: 04/26/19  4:50 PM  Result Value Ref Range   Glucose-Capillary 154 (H) 70 - 99 mg/dL   Comment 1 Notify RN    Comment 2 Document in Chart   Glucose, capillary     Status: Abnormal   Collection Time: 04/26/19  8:50 PM  Result Value Ref Range   Glucose-Capillary 209 (H)  70 - 99 mg/dL  Basic metabolic panel     Status: Abnormal   Collection Time: 04/27/19  5:19 AM  Result Value Ref Range   Sodium 142 135 - 145 mmol/L   Potassium 4.0 3.5 - 5.1 mmol/L   Chloride 106 98 - 111 mmol/L   CO2 26 22 - 32 mmol/L   Glucose, Bld 170 (H) 70 -  99 mg/dL   BUN 44 (H) 8 - 23 mg/dL   Creatinine, Ser 2.09 (H) 0.61 - 1.24 mg/dL   Calcium 9.0 8.9 - 10.3 mg/dL   GFR calc non Af Amer 32 (L) >60 mL/min   GFR calc Af Amer 37 (L) >60 mL/min   Anion gap 10 5 - 15    Comment: Performed at [facility], [address] [place], [place] [address]  Glucose, capillary     Status: Abnormal   Collection Time: 04/27/19  7:39 AM  Result Value Ref Range   Glucose-Capillary 149 (H) 70 - 99 mg/dL  Glucose, capillary     Status: Abnormal   Collection Time: 04/27/19 11:42 AM  Result Value Ref Range   Glucose-Capillary 135 (H) 70 - 99 mg/dL  ECHOCARDIOGRAM COMPLETE     Status: None   Collection Time: 04/27/19 12:39 PM  Result Value Ref Range   Weight 4,198.4 oz   Height 69 in   BP 145/74 mmHg  Troponin I (High Sensitivity)     Status: None   Collection Time: 04/27/19  3:45 PM  Result Value Ref Range   Troponin I (High Sensitivity) 14 <18 ng/L    Comment: (NOTE) Elevated high sensitivity troponin I (hsTnI) values and significant  changes across serial measurements may suggest ACS but many other  chronic and acute conditions are known to elevate hsTnI results.  Refer to the "Links" section for chest pain algorithms and additional  guidance. Performed at [facility], [address] [place], [place] [address]   Glucose, capillary     Status: Abnormal   Collection Time: 04/27/19  4:48 PM  Result Value Ref Range   Glucose-Capillary 178 (H) 70 - 99 mg/dL  Troponin I (High Sensitivity)     Status: None   Collection Time: 04/27/19  5:45 PM  Result Value Ref Range   Troponin I (High Sensitivity) 13 <18 ng/L    Comment: (NOTE) Elevated high sensitivity troponin  I (hsTnI) values and significant  changes across serial measurements may suggest ACS but many other  chronic and acute conditions are known to elevate hsTnI results.  Refer to the "Links" section for chest pain algorithms and additional  guidance. Performed at [facility], [address] [place], [place] [address]   Glucose, capillary     Status: Abnormal   Collection Time: 04/27/19  9:48 PM  Result Value Ref Range   Glucose-Capillary 229 (H) 70 - 99 mg/dL   Comment 1 Notify RN    Comment 2 Document in Chart   Basic metabolic panel     Status: Abnormal   Collection Time: 04/28/19  5:14 AM  Result Value Ref Range   Sodium 139 135 - 145 mmol/L   Potassium 3.6 3.5 - 5.1 mmol/L   Chloride 106 98 - 111 mmol/L   CO2 23 22 - 32 mmol/L   Glucose, Bld 97 70 - 99 mg/dL   BUN 42 (H) 8 - 23 mg/dL   Creatinine, Ser 1.89 (H) 0.61 - 1.24 mg/dL   Calcium 8.8 (L) 8.9 - 10.3 mg/dL   GFR calc non Af Amer 36 (L) >60 mL/min   GFR calc Af Amer 42 (L) >60 mL/min   Anion gap 10 5 - 15    Comment: Performed at [facility], [address] [place], [place] [address]  CBC Tomorrow     Status: Abnormal   Collection Time: 04/28/19  5:14 AM  Result Value Ref Range  WBC 5.7 4.0 - 10.5 K/uL   RBC 3.83 (L) 4.22 - 5.81 MIL/uL   Hemoglobin 10.2 (L) 13.0 - 17.0 g/dL   HCT 32.4 (L) 39.0 - 52.0 %   MCV 84.6 80.0 - 100.0 fL   MCH 26.6 26.0 - 34.0 pg   MCHC 31.5 30.0 - 36.0 g/dL   RDW 17.5 (H) 11.5 - 15.5 %   Platelets 208 150 - 400 K/uL   nRBC 0.0 0.0 - 0.2 %    Comment: Performed at [facility], [address] [place], [place] [address]  Glucose, capillary     Status: None   Collection Time: 04/28/19  8:17 AM  Result Value Ref Range   Glucose-Capillary 89 70 - 99 mg/dL  Glucose, capillary     Status: Abnormal   Collection Time: 04/28/19 11:47 AM  Result Value Ref Range   Glucose-Capillary 168 (H) 70 - 99 mg/dL  Glucose, capillary     Status: Abnormal   Collection  Time: 04/28/19  5:01 PM  Result Value Ref Range   Glucose-Capillary 349 (H) 70 - 99 mg/dL  Glucose, capillary     Status: Abnormal   Collection Time: 04/28/19  9:26 PM  Result Value Ref Range   Glucose-Capillary 338 (H) 70 - 99 mg/dL  Basic metabolic panel     Status: Abnormal   Collection Time: 04/29/19  5:13 AM  Result Value Ref Range   Sodium 142 135 - 145 mmol/L   Potassium 3.6 3.5 - 5.1 mmol/L   Chloride 108 98 - 111 mmol/L   CO2 24 22 - 32 mmol/L   Glucose, Bld 152 (H) 70 - 99 mg/dL   BUN 41 (H) 8 - 23 mg/dL   Creatinine, Ser 1.86 (H) 0.61 - 1.24 mg/dL   Calcium 9.1 8.9 - 10.3 mg/dL   GFR calc non Af Amer 37 (L) >60 mL/min   GFR calc Af Amer 43 (L) >60 mL/min   Anion gap 10 5 - 15    Comment: Performed at [facility], [address] [place], [place] [address]  Glucose, capillary     Status: Abnormal   Collection Time: 04/29/19  7:39 AM  Result Value Ref Range   Glucose-Capillary 165 (H) 70 - 99 mg/dL  Glucose, capillary     Status: Abnormal   Collection Time: 04/29/19 10:50 AM  Result Value Ref Range   Glucose-Capillary 208 (H) 70 - 99 mg/dL  Glucose, capillary     Status: Abnormal   Collection Time: 04/29/19 12:05 PM  Result Value Ref Range   Glucose-Capillary 179 (H) 70 - 99 mg/dL  Glucose, capillary     Status: Abnormal   Collection Time: 04/29/19  5:03 PM  Result Value Ref Range   Glucose-Capillary 247 (H) 70 - 99 mg/dL  Glucose, capillary     Status: Abnormal   Collection Time: 04/29/19  9:16 PM  Result Value Ref Range   Glucose-Capillary 244 (H) 70 - 99 mg/dL   Comment 1 Notify RN    Comment 2 Document in Chart   Glucose, capillary     Status: Abnormal   Collection Time: 04/30/19  8:00 AM  Result Value Ref Range   Glucose-Capillary 110 (H) 70 - 99 mg/dL  Lipase, blood     Status: None   Collection Time: 05/09/19  9:11 AM  Result Value Ref Range   Lipase 40 11 - 51 U/L    Comment: Performed at [facility], [address]  [place], [place] [address]  Comprehensive metabolic panel     Status: Abnormal   Collection Time: 05/09/19  9:11 AM  Result Value Ref Range   Sodium 140 135 - 145 mmol/L   Potassium 3.7 3.5 - 5.1 mmol/L   Chloride 108 98 - 111 mmol/L   CO2 20 (L) 22 - 32 mmol/L   Glucose, Bld 174 (H) 70 - 99 mg/dL   BUN 46 (H) 8 - 23 mg/dL   Creatinine, Ser 2.25 (H) 0.61 - 1.24 mg/dL   Calcium 9.1 8.9 - 10.3 mg/dL   Total Protein 6.7 6.5 - 8.1 g/dL   Albumin 3.1 (L) 3.5 - 5.0 g/dL   AST 25 15 - 41 U/L   ALT 18 0 - 44 U/L   Alkaline Phosphatase 60 38 - 126 U/L   Total Bilirubin 0.6 0.3 - 1.2 mg/dL   GFR calc non Af Amer 29 (L) >60 mL/min   GFR calc Af Amer 34 (L) >60 mL/min   Anion gap 12 5 - 15    Comment: Performed at [facility], [address] [place], [place] [address]  CBC     Status: Abnormal   Collection Time: 05/09/19  9:11 AM  Result Value Ref Range   WBC 7.3 4.0 - 10.5 K/uL   RBC 3.82 (L) 4.22 - 5.81 MIL/uL   Hemoglobin 10.6 (L) 13.0 - 17.0 g/dL   HCT 32.2 (L) 39.0 - 52.0 %   MCV 84.3 80.0 - 100.0 fL   MCH 27.7 26.0 - 34.0 pg   MCHC 32.9 30.0 - 36.0 g/dL   RDW 17.7 (H) 11.5 - 15.5 %   Platelets 222 150 - 400 K/uL   nRBC 0.0 0.0 - 0.2 %    Comment: Performed at [facility], [address], [place], [place] [address]  Troponin I (High Sensitivity)     Status: None   Collection Time: 05/09/19  9:11 AM  Result Value Ref Range   Troponin I (High Sensitivity) 15 <18 ng/L    Comment: (NOTE) Elevated high sensitivity troponin I (hsTnI) values and significant  changes across serial measurements may suggest ACS but many other  chronic and acute conditions are known to elevate hsTnI results.  Refer to the "Links" section for chest pain algorithms and additional  guidance. Performed at [facility], [address] [place], [place] [address]   Hemoglobin A1c     Status: Abnormal   Collection Time: 05/09/19  1:51 PM  Result Value Ref Range   Hgb A1c MFr Bld  6.8 (H) 4.8 - 5.6 %    Comment: (NOTE) Pre diabetes:          5.7%-6.4% Diabetes:              >6.4% Glycemic control for   <7.0% adults with diabetes    Mean Plasma Glucose 148.46 mg/dL    Comment: Performed at [facility] [address], [place], [place] [address]  POC SARS Coronavirus 2 Ag     Status: None   Collection Time: 05/09/19  2:56 PM  Result Value Ref Range   SARS Coronavirus 2 Ag NEGATIVE NEGATIVE    Comment: (NOTE) SARS-CoV-2 antigen NOT DETECTED.  Negative results are presumptive.  Negative results do not preclude SARS-CoV-2 infection and should not be used as the sole basis for treatment or other patient management decisions, including infection  control decisions, particularly in the presence of clinical signs and  symptoms consistent with COVID-19,  or in those who have been in contact with the virus.  Negative results must be combined with clinical observations, patient history, and epidemiological information. The expected result is Negative. Fact Sheet for Patients: PodPark.tn Fact Sheet for Healthcare Providers: GiftContent.is This test is not yet approved or cleared by the [place] FDA and  has been authorized for detection and/or diagnosis of SARS-CoV-2 by FDA under an Emergency Use Authorization (EUA).  This EUA will remain in effect (meaning this test can be used) for the duration of  the COVID-19 de claration under Section 564(b)(1) of the Act, 21 U.S.C. section 360bbb-3(b)(1), unless the authorization is terminated or revoked sooner.   Glucose, capillary     Status: Abnormal   Collection Time: 05/09/19  5:07 PM  Result Value Ref Range   Glucose-Capillary 174 (H) 70 - 99 mg/dL  Glucose, capillary     Status: Abnormal   Collection Time: 05/09/19  9:00 PM  Result Value Ref Range   Glucose-Capillary 324 (H) 70 - 99 mg/dL   Comment 1 Notify RN   Glucose, capillary     Status:  Abnormal   Collection Time: 05/09/19 10:54 PM  Result Value Ref Range   Glucose-Capillary 303 (H) 70 - 99 mg/dL  CMP 3 days     Status: Abnormal   Collection Time: 05/10/19  4:57 AM  Result Value Ref Range   Sodium 137 135 - 145 mmol/L   Potassium 3.6 3.5 - 5.1 mmol/L   Chloride 105 98 - 111 mmol/L   CO2 20 (L) 22 - 32 mmol/L   Glucose, Bld 218 (H) 70 - 99 mg/dL   BUN 49 (H) 8 - 23 mg/dL   Creatinine, Ser 2.15 (H) 0.61 - 1.24 mg/dL   Calcium 9.0 8.9 - 10.3 mg/dL   Total Protein 6.2 (L) 6.5 - 8.1 g/dL   Albumin 2.8 (L) 3.5 - 5.0 g/dL   AST 19 15 - 41 U/L   ALT 17 0 - 44 U/L   Alkaline Phosphatase 53 38 - 126 U/L   Total Bilirubin 0.5 0.3 - 1.2 mg/dL   GFR calc non Af Amer 31 (L) >60 mL/min   GFR calc Af Amer 36 (L) >60 mL/min   Anion gap 12 5 - 15    Comment: Performed at [facility], [address] [place], [place] [address]  CBC Daily 5 days     Status: Abnormal   Collection Time: 05/10/19  4:57 AM  Result Value Ref Range   WBC 8.3 4.0 - 10.5 K/uL   RBC 3.81 (L) 4.22 - 5.81 MIL/uL   Hemoglobin 10.4 (L) 13.0 - 17.0 g/dL   HCT 32.7 (L) 39.0 - 52.0 %   MCV 85.8 80.0 - 100.0 fL   MCH 27.3 26.0 - 34.0 pg   MCHC 31.8 30.0 - 36.0 g/dL   RDW 16.9 (H) 11.5 - 15.5 %   Platelets 242 150 - 400 K/uL   nRBC 0.0 0.0 - 0.2 %    Comment: Performed at [facility], [address] [place], [place] [address]  Mg Daily 5 days     Status: None   Collection Time: 05/10/19  4:57 AM  Result Value Ref Range   Magnesium 2.0 1.7 - 2.4 mg/dL    Comment: Performed at [facility], [address] [place], [place] [address]  Glucose, capillary     Status: Abnormal   Collection Time: 05/10/19  7:54 AM  Result Value Ref Range  Glucose-Capillary 131 (H) 70 - 99 mg/dL  Glucose, capillary     Status: Abnormal   Collection Time: 05/10/19 11:46 AM  Result Value Ref Range   Glucose-Capillary 166 (H) 70 - 99 mg/dL  Glucose, capillary     Status: Abnormal   Collection  Time: 05/10/19  4:36 PM  Result Value Ref Range   Glucose-Capillary 225 (H) 70 - 99 mg/dL  Protime-INR     Status: None   Collection Time: 05/10/19  4:40 PM  Result Value Ref Range   Prothrombin Time 12.9 11.4 - 15.2 seconds   INR 1.0 0.8 - 1.2    Comment: (NOTE) INR goal varies based on device and disease states. Performed at [facility], [address] [place], [place] [address]   APTT     Status: None   Collection Time: 05/10/19  4:40 PM  Result Value Ref Range   aPTT 31 24 - 36 seconds    Comment: Performed at [facility], [address] [place], [place] [address]  Brain natriuretic peptide     Status: None   Collection Time: 05/10/19  4:40 PM  Result Value Ref Range   B Natriuretic Peptide 75.0 0.0 - 100.0 pg/mL    Comment: Performed at [facility], [address] [place], [place] [address]  Fibrin derivatives D-Dimer [facility] only)     Status: Abnormal   Collection Time: 05/10/19  4:40 PM  Result Value Ref Range   Fibrin derivatives D-dimer ([name]) 627.45 (H) 0.00 - 499.00 ng/mL (FEU)    Comment: (NOTE) <> Exclusion of Venous Thromboembolism (VTE) - OUTPATIENT ONLY   (Emergency Department or [place])   0-499 ng/ml (FEU): With a low to intermediate pretest probability                      for VTE this test result excludes the diagnosis                      of VTE.   >499 ng/ml (FEU) : VTE not excluded; additional work up for VTE is                      required. <> Testing on Inpatients and Evaluation of Disseminated Intravascular   Coagulation (DIC) Reference Range:   0-499 ng/ml (FEU) Performed at [facility], [address] [place], [place] [address]   Glucose, capillary     Status: Abnormal   Collection Time: 05/10/19  8:43 PM  Result Value Ref Range   Glucose-Capillary 231 (H) 70 - 99 mg/dL   Comment 1 Notify RN   CMP 3 days     Status: Abnormal   Collection Time: 05/11/19  4:44 AM  Result Value Ref Range   Sodium  140 135 - 145 mmol/L   Potassium 3.3 (L) 3.5 - 5.1 mmol/L   Chloride 107 98 - 111 mmol/L   CO2 22 22 - 32 mmol/L   Glucose, Bld 60 (L) 70 - 99 mg/dL   BUN 49 (H) 8 - 23 mg/dL   Creatinine, Ser 1.90 (H) 0.61 - 1.24 mg/dL   Calcium 9.0 8.9 - 10.3 mg/dL   Total Protein 6.5 6.5 - 8.1 g/dL   Albumin 3.0 (L) 3.5 - 5.0 g/dL   AST 19 15 - 41 U/L   ALT 17 0 - 44 U/L   Alkaline Phosphatase 49 38 - 126 U/L   Total Bilirubin 0.6 0.3 -  1.2 mg/dL   GFR calc non Af Amer 36 (L) >60 mL/min   GFR calc Af Amer 42 (L) >60 mL/min   Anion gap 11 5 - 15    Comment: Performed at [facility], [address] [place], [place] [address]  CBC Daily 5 days     Status: Abnormal   Collection Time: 05/11/19  4:44 AM  Result Value Ref Range   WBC 9.6 4.0 - 10.5 K/uL   RBC 3.87 (L) 4.22 - 5.81 MIL/uL   Hemoglobin 10.5 (L) 13.0 - 17.0 g/dL   HCT 33.1 (L) 39.0 - 52.0 %   MCV 85.5 80.0 - 100.0 fL   MCH 27.1 26.0 - 34.0 pg   MCHC 31.7 30.0 - 36.0 g/dL   RDW 16.9 (H) 11.5 - 15.5 %   Platelets 256 150 - 400 K/uL   nRBC 0.0 0.0 - 0.2 %    Comment: Performed at [facility], [address] [place], [place] [address]  Mg Daily 5 days     Status: None   Collection Time: 05/11/19  4:44 AM  Result Value Ref Range   Magnesium 2.1 1.7 - 2.4 mg/dL    Comment: Performed at [facility], [address] [place], [place] [address]  Glucose, capillary     Status: Abnormal   Collection Time: 05/11/19  7:41 AM  Result Value Ref Range   Glucose-Capillary 69 (L) 70 - 99 mg/dL  Glucose, capillary     Status: None   Collection Time: 05/11/19  8:13 AM  Result Value Ref Range   Glucose-Capillary 93 70 - 99 mg/dL  Glucose, capillary     Status: Abnormal   Collection Time: 05/11/19  1:14 PM  Result Value Ref Range   Glucose-Capillary 316 (H) 70 - 99 mg/dL   Comment 1 Notify RN   Glucose, capillary     Status: Abnormal   Collection Time: 05/11/19  5:04 PM  Result Value Ref Range    Glucose-Capillary 233 (H) 70 - 99 mg/dL   Comment 1 Notify RN   Glucose, capillary     Status: Abnormal   Collection Time: 05/11/19  8:45 PM  Result Value Ref Range   Glucose-Capillary 254 (H) 70 - 99 mg/dL  Glucose, capillary     Status: Abnormal   Collection Time: 05/12/19 12:09 AM  Result Value Ref Range   Glucose-Capillary 170 (H) 70 - 99 mg/dL  CMP 3 days     Status: Abnormal   Collection Time: 05/12/19  5:33 AM  Result Value Ref Range   Sodium 139 135 - 145 mmol/L   Potassium 3.2 (L) 3.5 - 5.1 mmol/L   Chloride 105 98 - 111 mmol/L   CO2 21 (L) 22 - 32 mmol/L   Glucose, Bld 73 70 - 99 mg/dL   BUN 47 (H) 8 - 23 mg/dL   Creatinine, Ser 2.15 (H) 0.61 - 1.24 mg/dL   Calcium 9.0 8.9 - 10.3 mg/dL   Total Protein 6.6 6.5 - 8.1 g/dL   Albumin 3.1 (L) 3.5 - 5.0 g/dL   AST 18 15 - 41 U/L   ALT 18 0 - 44 U/L   Alkaline Phosphatase 55 38 - 126 U/L   Total Bilirubin 0.5 0.3 - 1.2 mg/dL   GFR calc non Af Amer 31 (L) >60 mL/min   GFR calc Af Amer 36 (L) >60 mL/min   Anion gap 13 5 - 15    Comment: Performed at [facility], [place], [place] [address]  CBC Daily 5 days     Status: Abnormal   Collection Time: 05/12/19  5:33 AM  Result Value Ref Range   WBC 9.9 4.0 - 10.5 K/uL   RBC 3.89 (L) 4.22 - 5.81 MIL/uL   Hemoglobin 10.7 (L) 13.0 - 17.0 g/dL   HCT 33.5 (L) 39.0 - 52.0 %   MCV 86.1 80.0 - 100.0 fL   MCH 27.5 26.0 - 34.0 pg   MCHC 31.9 30.0 - 36.0 g/dL   RDW 16.9 (H) 11.5 - 15.5 %   Platelets 262 150 - 400 K/uL   nRBC 0.0 0.0 - 0.2 %    Comment: Performed at [facility], [address] [place], [place] [address]  Mg Daily 5 days     Status: None   Collection Time: 05/12/19  5:33 AM  Result Value Ref Range   Magnesium 2.2 1.7 - 2.4 mg/dL    Comment: Performed at [facility], [address] [place], [place] [address]  Glucose, capillary     Status: None   Collection Time: 05/12/19  7:46 AM  Result Value Ref Range    Glucose-Capillary 71 70 - 99 mg/dL   Comment 1 Notify RN   Glucose, capillary     Status: Abnormal   Collection Time: 05/12/19 11:21 AM  Result Value Ref Range   Glucose-Capillary 367 (H) 70 - 99 mg/dL  Glucose, capillary     Status: Abnormal   Collection Time: 05/12/19  4:33 PM  Result Value Ref Range   Glucose-Capillary 268 (H) 70 - 99 mg/dL   Comment 1 Notify RN   Glucose, capillary     Status: Abnormal   Collection Time: 05/12/19 11:08 PM  Result Value Ref Range   Glucose-Capillary 202 (H) 70 - 99 mg/dL  Glucose, capillary     Status: Abnormal   Collection Time: 05/13/19  4:04 AM  Result Value Ref Range   Glucose-Capillary 190 (H) 70 - 99 mg/dL  CBC Daily 5 days     Status: Abnormal   Collection Time: 05/13/19  5:42 AM  Result Value Ref Range   WBC 9.6 4.0 - 10.5 K/uL   RBC 3.83 (L) 4.22 - 5.81 MIL/uL   Hemoglobin 10.5 (L) 13.0 - 17.0 g/dL   HCT 32.9 (L) 39.0 - 52.0 %   MCV 85.9 80.0 - 100.0 fL   MCH 27.4 26.0 - 34.0 pg   MCHC 31.9 30.0 - 36.0 g/dL   RDW 16.9 (H) 11.5 - 15.5 %   Platelets 258 150 - 400 K/uL   nRBC 0.0 0.0 - 0.2 %    Comment: Performed at [facility], [address] [place], [place] [address]  Mg Daily 5 days     Status: None   Collection Time: 05/13/19  5:42 AM  Result Value Ref Range   Magnesium 2.3 1.7 - 2.4 mg/dL    Comment: Performed at [facility], [address] [place],  [address]  Glucose, capillary     Status: Abnormal   Collection Time: 05/13/19  7:36 AM  Result Value Ref Range   Glucose-Capillary 100 (H) 70 - 99 mg/dL  Glucose, capillary     Status: Abnormal   Collection Time: 05/13/19 12:16 PM  Result Value Ref Range   Glucose-Capillary 134 (H) 70 - 99 mg/dL  Glucose, capillary     Status: Abnormal   Collection Time: 05/13/19  4:28 PM  Result Value  Ref Range   Glucose-Capillary 276 (H) 70 - 99 mg/dL  CBC     Status: Abnormal   Collection Time: 05/15/19  3:15 PM  Result Value Ref Range   WBC 10.4 4.0 -  10.5 K/uL   RBC 4.05 (L) 4.22 - 5.81 MIL/uL   Hemoglobin 11.3 (L) 13.0 - 17.0 g/dL   HCT 34.2 (L) 39.0 - 52.0 %   MCV 84.4 80.0 - 100.0 fL   MCH 27.9 26.0 - 34.0 pg   MCHC 33.0 30.0 - 36.0 g/dL   RDW 16.8 (H) 11.5 - 15.5 %   Platelets 253 150 - 400 K/uL   nRBC 0.0 0.0 - 0.2 %    Comment: Performed at [facility], [address] [place], [place] [address]  Basic metabolic panel     Status: Abnormal   Collection Time: 05/15/19  3:15 PM  Result Value Ref Range   Sodium 138 135 - 145 mmol/L   Potassium 3.4 (L) 3.5 - 5.1 mmol/L   Chloride 102 98 - 111 mmol/L   CO2 22 22 - 32 mmol/L   Glucose, Bld 247 (H) 70 - 99 mg/dL   BUN 61 (H) 8 - 23 mg/dL   Creatinine, Ser 2.50 (H) 0.61 - 1.24 mg/dL   Calcium 9.3 8.9 - 10.3 mg/dL   GFR calc non Af Amer 26 (L) >60 mL/min   GFR calc Af Amer 30 (L) >60 mL/min   Anion gap 14 5 - 15    Comment: Performed at [facility], [address] [place], [place] [address]  Type and screen [place]     Status: None   Collection Time: 05/16/19 12:14 AM  Result Value Ref Range   ABO/RH(D) A POS    Antibody Screen NEG    Sample Expiration      05/19/2019,2359 Performed at [facility], [address] [place], [place] [address]   SARS CORONAVIRUS 2 (TAT 6-24 HRS) Nasopharyngeal Nasopharyngeal Swab     Status: None   Collection Time: 05/16/19  4:02 AM   Specimen: Nasopharyngeal Swab  Result Value Ref Range   SARS Coronavirus 2 NEGATIVE NEGATIVE    Comment: (NOTE) SARS-CoV-2 target nucleic acids are NOT DETECTED. The SARS-CoV-2 RNA is generally detectable in upper and lower respiratory specimens during the acute phase of infection. Negative results do not preclude SARS-CoV-2 infection, do not rule out co-infections with other pathogens, and should not be used as the sole basis for treatment or other patient management decisions. Negative results must be combined with clinical observations, patient  history, and epidemiological information. The expected result is Negative. Fact Sheet for Patients: SugarRoll.be Fact Sheet for Healthcare Providers: https://www.woods-mathews.com/ This test is not yet approved or cleared by the [place] FDA and  has been authorized for detection and/or diagnosis of SARS-CoV-2 by FDA under an Emergency Use Authorization (EUA). This EUA will remain  in effect (meaning this test can be used) for the duration of the COVID-19 declaration under Section 56 4(b)(1) of the Act, 21 U.S.C. section 360bbb-3(b)(1), unless the authorization is terminated or revoked sooner. Performed at [facility], [place] [address], [place] [address]   CBC     Status: Abnormal   Collection Time: 05/16/19  6:30 AM  Result Value Ref Range   WBC 8.5 4.0 - 10.5 K/uL   RBC 4.09 (L) 4.22 - 5.81 MIL/uL   Hemoglobin 11.2 (L) 13.0 - 17.0 g/dL   HCT 34.4 (  L) 39.0 - 52.0 %   MCV 84.1 80.0 - 100.0 fL   MCH 27.4 26.0 - 34.0 pg   MCHC 32.6 30.0 - 36.0 g/dL   RDW 16.9 (H) 11.5 - 15.5 %   Platelets 231 150 - 400 K/uL   nRBC 0.0 0.0 - 0.2 %    Comment: Performed at [facility], [address] [place], [place] [address]  Basic metabolic panel     Status: Abnormal   Collection Time: 05/16/19  6:30 AM  Result Value Ref Range   Sodium 143 135 - 145 mmol/L   Potassium 3.1 (L) 3.5 - 5.1 mmol/L   Chloride 108 98 - 111 mmol/L   CO2 24 22 - 32 mmol/L   Glucose, Bld 131 (H) 70 - 99 mg/dL   BUN 51 (H) 8 - 23 mg/dL   Creatinine, Ser 1.99 (H) 0.61 - 1.24 mg/dL   Calcium 9.0 8.9 - 10.3 mg/dL   GFR calc non Af Amer 34 (L) >60 mL/min   GFR calc Af Amer 40 (L) >60 mL/min   Anion gap 11 5 - 15    Comment: Performed at [facility], [address] [place], [place] [address]  Glucose, capillary     Status: None   Collection Time: 05/16/19  9:04 AM  Result Value Ref Range   Glucose-Capillary 93 70 - 99 mg/dL  Glucose,  capillary     Status: Abnormal   Collection Time: 05/16/19 12:21 PM  Result Value Ref Range   Glucose-Capillary 68 (L) 70 - 99 mg/dL  Glucose, capillary     Status: None   Collection Time: 05/16/19  1:22 PM  Result Value Ref Range   Glucose-Capillary 92 70 - 99 mg/dL  Glucose, capillary     Status: None   Collection Time: 05/16/19  5:42 PM  Result Value Ref Range   Glucose-Capillary 87 70 - 99 mg/dL  Glucose, capillary     Status: Abnormal   Collection Time: 05/16/19 10:46 PM  Result Value Ref Range   Glucose-Capillary 128 (H) 70 - 99 mg/dL  Glucose, capillary     Status: Abnormal   Collection Time: 05/17/19  4:53 AM  Result Value Ref Range   Glucose-Capillary 123 (H) 70 - 99 mg/dL  CBC with Differential/Platelet     Status: Abnormal   Collection Time: 05/17/19  7:29 AM  Result Value Ref Range   WBC 7.3 4.0 - 10.5 K/uL   RBC 3.81 (L) 4.22 - 5.81 MIL/uL   Hemoglobin 10.5 (L) 13.0 - 17.0 g/dL   HCT 32.4 (L) 39.0 - 52.0 %   MCV 85.0 80.0 - 100.0 fL   MCH 27.6 26.0 - 34.0 pg   MCHC 32.4 30.0 - 36.0 g/dL   RDW 16.8 (H) 11.5 - 15.5 %   Platelets 214 150 - 400 K/uL   nRBC 0.0 0.0 - 0.2 %   Neutrophils Relative % 65 %   Neutro Abs 4.9 1.7 - 7.7 K/uL   Lymphocytes Relative 21 %   Lymphs Abs 1.5 0.7 - 4.0 K/uL   Monocytes Relative 9 %   Monocytes Absolute 0.6 0.1 - 1.0 K/uL   Eosinophils Relative 3 %   Eosinophils Absolute 0.2 0.0 - 0.5 K/uL   Basophils Relative 1 %   Basophils Absolute 0.1 0.0 - 0.1 K/uL   Immature Granulocytes 1 %   Abs Immature Granulocytes 0.07 0.00 - 0.07 K/uL    Comment: Performed at [facility], [address] [place], [place] [address]  Basic metabolic panel     Status: Abnormal   Collection Time: 05/17/19  7:29 AM  Result Value Ref Range   Sodium 138 135 - 145 mmol/L   Potassium 3.1 (L) 3.5 - 5.1 mmol/L   Chloride 104 98 - 111 mmol/L   CO2 24 22 - 32 mmol/L   Glucose, Bld 205 (H) 70 - 99 mg/dL   BUN 40 (H) 8 - 23 mg/dL   Creatinine,  Ser 1.83 (H) 0.61 - 1.24 mg/dL   Calcium 8.3 (L) 8.9 - 10.3 mg/dL   GFR calc non Af Amer 38 (L) >60 mL/min   GFR calc Af Amer 44 (L) >60 mL/min   Anion gap 10 5 - 15    Comment: Performed at [facility], [address], [place], [place] [address]  Magnesium     Status: None   Collection Time: 05/17/19  8:30 AM  Result Value Ref Range   Magnesium 2.0 1.7 - 2.4 mg/dL    Comment: Performed at [facility], [address] [place], [place] [address]  Glucose, capillary     Status: Abnormal   Collection Time: 05/17/19  8:32 AM  Result Value Ref Range   Glucose-Capillary 183 (H) 70 - 99 mg/dL  Glucose, capillary     Status: Abnormal   Collection Time: 05/17/19 12:37 PM  Result Value Ref Range   Glucose-Capillary 51 (L) 70 - 99 mg/dL  Glucose, capillary     Status: Abnormal   Collection Time: 05/17/19  1:10 PM  Result Value Ref Range   Glucose-Capillary 108 (H) 70 - 99 mg/dL  Glucose, capillary     Status: Abnormal   Collection Time: 05/17/19  2:40 PM  Result Value Ref Range   Glucose-Capillary 64 (L) 70 - 99 mg/dL  Glucose, capillary     Status: Abnormal   Collection Time: 05/17/19  3:28 PM  Result Value Ref Range   Glucose-Capillary 119 (H) 70 - 99 mg/dL  Culture, bal-quantitative     Status: Abnormal   Collection Time: 05/17/19  4:16 PM   Specimen: Bronchoalveolar Lavage; Respiratory  Result Value Ref Range   Specimen Description      BRONCHIAL ALVEOLAR LAVAGE Performed at [facility], [address] [place], [place] [address]    Special Requests      NONE Performed at [facility], [address] [place], [place] [address]    Gram Stain      FEW WBC PRESENT, PREDOMINANTLY MONONUCLEAR NO ORGANISMS SEEN    Culture (A)     5,000 COLONIES/mL Consistent with normal respiratory flora. Performed at [facility], [place] [address] [place], [place] [address]    Report Status 05/22/2019 FINAL   Acid Fast Smear (AFB)     Status:  None   Collection Time: 05/17/19  4:16 PM   Specimen: Bronchoalveolar Lavage; Respiratory  Result Value Ref Range   AFB Specimen Processing Concentration    Acid Fast Smear Negative     Comment: (NOTE) Performed At: [facility] [place], [place] [id number] Rush Farmer MD OV:[id number]    Source (AFB) BRONCHIAL ALVEOLAR LAVAGE     Comment: Performed at [facility], [address] [place], [place] [address]  Acid Fast Culture with reflexed sensitivities     Status: None   Collection Time: 05/17/19  4:16 PM   Specimen: Bronchoalveolar Lavage; Respiratory  Result Value Ref Range   Acid Fast  Culture Negative     Comment: (NOTE) No acid fast bacilli isolated after 6 weeks. Performed At: [facility] [place], [place] [id number] Rush Farmer MD [id number]    Source of Sample BRONCHIAL ALVEOLAR LAVAGE     Comment: Performed at [facility], [address] [place], [place] [address]  Culture, fungus without smear     Status: None   Collection Time: 05/17/19  4:16 PM   Specimen: Bronchoalveolar Lavage; Other  Result Value Ref Range   Specimen Description      BRONCHIAL ALVEOLAR LAVAGE Performed at [facility], [address], [place], [place] [address]    Special Requests      NONE Performed at [facility], [address], [place], [place] [address]    Culture      NO FUNGUS ISOLATED AFTER 21 DAYS Performed at [facility], [place] [address], [place], [place] [address]    Report Status 06/07/2019 FINAL   Body fluid cell count with differential     Status: Abnormal   Collection Time: 05/17/19  4:16 PM  Result Value Ref Range   Fluid Type-FCT BRONCHIAL ALVEOLAR LAVAGE     Comment: Performed at [facility], [address] [place], [place] [address]   Color, Fluid COLORLESS    Appearance, Fluid HAZY (A) CLEAR   Total Nucleated Cell Count, Fluid 178 0 - 1,000 cu mm    Neutrophil Count, Fluid 4 0 - 25 %   Lymphs, Fluid 27 %   Monocyte-Macrophage-Serous Fluid 69 50 - 90 %    Comment: Performed at [place] [address], [place], [place] [address]  Pathologist smear review     Status: None   Collection Time: 05/17/19  4:16 PM  Result Value Ref Range   Path Review [id number]     Comment: PULMONARY MACROPHAGES, PNEUMOCYTES Reviewed by Charolett Bumpers. Jeannie Done, M.D. Performed at [facility], [place] [address], [place], [place] [address]   Glucose, capillary     Status: Abnormal   Collection Time: 05/17/19  4:37 PM  Result Value Ref Range   Glucose-Capillary 102 (H) 70 - 99 mg/dL  Glucose, capillary     Status: Abnormal   Collection Time: 05/18/19 12:21 AM  Result Value Ref Range   Glucose-Capillary 165 (H) 70 - 99 mg/dL   Comment 1 Notify RN   Basic metabolic panel     Status: Abnormal   Collection Time: 05/18/19  7:36 AM  Result Value Ref Range   Sodium 135 135 - 145 mmol/L   Potassium 3.4 (L) 3.5 - 5.1 mmol/L   Chloride 104 98 - 111 mmol/L   CO2 23 22 - 32 mmol/L   Glucose, Bld 161 (H) 70 - 99 mg/dL   BUN 34 (H) 8 - 23 mg/dL   Creatinine, Ser 1.73 (H) 0.61 - 1.24 mg/dL   Calcium 8.4 (L) 8.9 - 10.3 mg/dL   GFR calc non Af Amer 41 (L) >60 mL/min   GFR calc Af Amer 47 (L) >60 mL/min   Anion gap 8 5 - 15    Comment: Performed at [facility], [address] [place], [place] [address]  CBC with Differential/Platelet     Status: Abnormal   Collection Time: 05/18/19  7:36 AM  Result Value Ref Range   WBC 7.0 4.0 - 10.5 K/uL   RBC 3.75 (L) 4.22 - 5.81 MIL/uL   Hemoglobin 10.3 (L) 13.0 - 17.0  g/dL   HCT 33.0 (L) 39.0 - 52.0 %   MCV 88.0 80.0 - 100.0 fL   MCH 27.5 26.0 - 34.0 pg   MCHC 31.2 30.0 - 36.0 g/dL   RDW 16.3 (H) 11.5 - 15.5 %   Platelets 207 150 - 400 K/uL   nRBC 0.0 0.0 - 0.2 %   Neutrophils Relative % 65 %   Neutro Abs 4.6 1.7 - 7.7 K/uL   Lymphocytes Relative 22 %   Lymphs Abs 1.6 0.7 - 4.0 K/uL    Monocytes Relative 9 %   Monocytes Absolute 0.6 0.1 - 1.0 K/uL   Eosinophils Relative 2 %   Eosinophils Absolute 0.2 0.0 - 0.5 K/uL   Basophils Relative 1 %   Basophils Absolute 0.1 0.0 - 0.1 K/uL   Immature Granulocytes 1 %   Abs Immature Granulocytes 0.05 0.00 - 0.07 K/uL    Comment: Performed at [facility], [address] [place], [place] [address]  Glucose, capillary     Status: Abnormal   Collection Time: 05/18/19  7:38 AM  Result Value Ref Range   Glucose-Capillary 152 (H) 70 - 99 mg/dL  Basic metabolic panel     Status: Abnormal   Collection Time: 05/19/19  3:22 PM  Result Value Ref Range   Sodium 139 135 - 145 mmol/L   Potassium 3.0 (L) 3.5 - 5.1 mmol/L   Chloride 106 98 - 111 mmol/L   CO2 22 22 - 32 mmol/L   Glucose, Bld 204 (H) 70 - 99 mg/dL   BUN 55 (H) 8 - 23 mg/dL   Creatinine, Ser 2.56 (H) 0.61 - 1.24 mg/dL   Calcium 8.9 8.9 - 10.3 mg/dL   GFR calc non Af Amer 25 (L) >60 mL/min   GFR calc Af Amer 29 (L) >60 mL/min   Anion gap 11 5 - 15    Comment: Performed at [facility], [address] [place], [place] [address]  CBC     Status: Abnormal   Collection Time: 05/19/19  3:22 PM  Result Value Ref Range   WBC 8.0 4.0 - 10.5 K/uL   RBC 4.01 (L) 4.22 - 5.81 MIL/uL   Hemoglobin 11.1 (L) 13.0 - 17.0 g/dL   HCT 35.2 (L) 39.0 - 52.0 %   MCV 87.8 80.0 - 100.0 fL   MCH 27.7 26.0 - 34.0 pg   MCHC 31.5 30.0 - 36.0 g/dL   RDW 16.2 (H) 11.5 - 15.5 %   Platelets 210 150 - 400 K/uL   nRBC 0.0 0.0 - 0.2 %    Comment: Performed at [facility], [place], [place] [address]  Troponin I (High Sensitivity)     Status: Abnormal   Collection Time: 05/19/19  3:22 PM  Result Value Ref Range   Troponin I (High Sensitivity) 18 (H) <18 ng/L    Comment: (NOTE) Elevated high sensitivity troponin I (hsTnI) values and significant  changes across serial measurements may suggest ACS but many other  chronic and acute conditions are known to  elevate hsTnI results.  Refer to the "Links" section for chest pain algorithms and additional  guidance. Performed at [facility], [address] [place], [place] [address]   Brain natriuretic peptide     Status: Abnormal   Collection Time: 05/19/19  3:22 PM  Result Value Ref Range   B Natriuretic Peptide 143.0 (H) 0.0 - 100.0 pg/mL    Comment: Performed at [facility], [address], [place] [address]  Troponin I (High Sensitivity)     Status: Abnormal   Collection Time: 05/19/19  5:48 PM  Result Value Ref Range   Troponin I (High Sensitivity) 19 (H) <18 ng/L    Comment: (NOTE) Elevated high sensitivity troponin I (hsTnI) values and significant  changes across serial measurements may suggest ACS but many other  chronic and acute conditions are known to elevate hsTnI results.  Refer to the "Links" section for chest pain algorithms and additional  guidance. Performed at [facility], [place], [place] [address]   SARS CORONAVIRUS 2 (TAT 6-24 HRS) Nasopharyngeal Nasopharyngeal Swab     Status: None   Collection Time: 05/19/19  8:21 PM   Specimen: Nasopharyngeal Swab  Result Value Ref Range   SARS Coronavirus 2 NEGATIVE NEGATIVE    Comment: (NOTE) SARS-CoV-2 target nucleic acids are NOT DETECTED. The SARS-CoV-2 RNA is generally detectable in upper and lower respiratory specimens during the acute phase of infection. Negative results do not preclude SARS-CoV-2 infection, do not rule out co-infections with other pathogens, and should not be used as the sole basis for treatment or other patient management decisions. Negative results must be combined with clinical observations, patient history, and epidemiological information. The expected result is Negative. Fact Sheet for Patients: SugarRoll.be Fact Sheet for Healthcare Providers: https://www.woods-mathews.com/ This test is not yet  approved or cleared by the [place] FDA and  has been authorized for detection and/or diagnosis of SARS-CoV-2 by FDA under an Emergency Use Authorization (EUA). This EUA will remain  in effect (meaning this test can be used) for the duration of the COVID-19 declaration under Section 56 4(b)(1) of the Act, 21 U.S.C. section 360bbb-3(b)(1), unless the authorization is terminated or revoked sooner. Performed at [facility], [place] [address] [place], [place] [address]   POC SARS Coronavirus 2 Ag     Status: None   Collection Time: 05/19/19  9:01 PM  Result Value Ref Range   SARS Coronavirus 2 Ag NEGATIVE NEGATIVE    Comment: (NOTE) SARS-CoV-2 antigen NOT DETECTED.  Negative results are presumptive.  Negative results do not preclude SARS-CoV-2 infection and should not be used as the sole basis for treatment or other patient management decisions, including infection  control decisions, particularly in the presence of clinical signs and  symptoms consistent with COVID-19, or in those who have been in contact with the virus.  Negative results must be combined with clinical observations, patient history, and epidemiological information. The expected result is Negative. Fact Sheet for Patients: PodPark.tn Fact Sheet for Healthcare Providers: GiftContent.is This test is not yet approved or cleared by the [place] FDA and  has been authorized for detection and/or diagnosis of SARS-CoV-2 by FDA under an Emergency Use Authorization (EUA).  This EUA will remain in effect (meaning this test can be used) for the duration of  the COVID-19 de claration under Section 564(b)(1) of the Act, 21 U.S.C. section 360bbb-3(b)(1), unless the authorization is terminated or revoked sooner.   Glucose, capillary     Status: Abnormal   Collection Time: 05/19/19 10:32 PM  Result Value Ref Range   Glucose-Capillary 140 (H) 70 - 99 mg/dL   Basic metabolic panel     Status: Abnormal   Collection Time: 05/20/19  3:11 AM  Result Value Ref Range   Sodium 139 135 - 145 mmol/L   Potassium 2.9 (L) 3.5 - 5.1 mmol/L   Chloride 107 98 -  111 mmol/L   CO2 23 22 - 32 mmol/L   Glucose, Bld 98 70 - 99 mg/dL   BUN 50 (H) 8 - 23 mg/dL   Creatinine, Ser 1.82 (H) 0.61 - 1.24 mg/dL   Calcium 8.6 (L) 8.9 - 10.3 mg/dL   GFR calc non Af Amer 38 (L) >60 mL/min   GFR calc Af Amer 44 (L) >60 mL/min   Anion gap 9 5 - 15    Comment: Performed at [facility], [address] [place], [place] [address]  CBC     Status: Abnormal   Collection Time: 05/20/19  3:11 AM  Result Value Ref Range   WBC 7.6 4.0 - 10.5 K/uL   RBC 3.70 (L) 4.22 - 5.81 MIL/uL   Hemoglobin 10.2 (L) 13.0 - 17.0 g/dL   HCT 31.1 (L) 39.0 - 52.0 %   MCV 84.1 80.0 - 100.0 fL   MCH 27.6 26.0 - 34.0 pg   MCHC 32.8 30.0 - 36.0 g/dL   RDW 16.1 (H) 11.5 - 15.5 %   Platelets 189 150 - 400 K/uL   nRBC 0.0 0.0 - 0.2 %    Comment: Performed at [facility], [address] [place], [place] [address]  Magnesium     Status: None   Collection Time: 05/20/19  3:11 AM  Result Value Ref Range   Magnesium 2.0 1.7 - 2.4 mg/dL    Comment: Performed at [facility], [address] [place], [place] [address]  Hemoglobin A1c     Status: Abnormal   Collection Time: 05/20/19  3:11 AM  Result Value Ref Range   Hgb A1c MFr Bld 7.0 (H) 4.8 - 5.6 %    Comment: (NOTE) Pre diabetes:          5.7%-6.4% Diabetes:              >6.4% Glycemic control for   <7.0% adults with diabetes    Mean Plasma Glucose 154.2 mg/dL    Comment: Performed at [facility], [place] [address], [place], [place] [address]  Glucose, capillary     Status: Abnormal   Collection Time: 05/20/19  8:33 AM  Result Value Ref Range   Glucose-Capillary 107 (H) 70 - 99 mg/dL  Troponin I (High Sensitivity)     Status: None   Collection Time: 05/20/19  9:29 AM  Result Value Ref Range   Troponin  I (High Sensitivity) 16 <18 ng/L    Comment: (NOTE) Elevated high sensitivity troponin I (hsTnI) values and significant  changes across serial measurements may suggest ACS but many other  chronic and acute conditions are known to elevate hsTnI results.  Refer to the "Links" section for chest pain algorithms and additional  guidance. Performed at [facility], [address] [place], [place] [address]   Potassium     Status: Abnormal   Collection Time: 05/20/19  9:29 AM  Result Value Ref Range   Potassium 3.4 (L) 3.5 - 5.1 mmol/L    Comment: Performed at [facility], [address] [place], [place] [address]  Magnesium     Status: None   Collection Time: 05/20/19  9:29 AM  Result Value Ref Range   Magnesium 2.0 1.7 - 2.4 mg/dL    Comment: Performed at [facility], [address] [place], [place] [address]  Glucose, capillary     Status: Abnormal   Collection Time: 05/20/19 12:34 PM  Result Value Ref Range  Glucose-Capillary 165 (H) 70 - 99 mg/dL  Glucose, capillary     Status: Abnormal   Collection Time: 05/20/19  4:43 PM  Result Value Ref Range   Glucose-Capillary 222 (H) 70 - 99 mg/dL  Glucose, capillary     Status: Abnormal   Collection Time: 05/20/19  9:33 PM  Result Value Ref Range   Glucose-Capillary 348 (H) 70 - 99 mg/dL   Comment 1 Notify RN   Troponin I (High Sensitivity)     Status: None   Collection Time: 05/21/19  4:52 AM  Result Value Ref Range   Troponin I (High Sensitivity) 11 <18 ng/L    Comment: (NOTE) Elevated high sensitivity troponin I (hsTnI) values and significant  changes across serial measurements may suggest ACS but many other  chronic and acute conditions are known to elevate hsTnI results.  Refer to the "Links" section for chest pain algorithms and additional  guidance. Performed at [facility], [address] [place], [place] [address]   Glucose, capillary     Status: Abnormal   Collection Time:  05/21/19  7:46 AM  Result Value Ref Range   Glucose-Capillary 303 (H) 70 - 99 mg/dL  Basic metabolic panel     Status: Abnormal   Collection Time: 05/21/19  8:51 AM  Result Value Ref Range   Sodium 133 (L) 135 - 145 mmol/L   Potassium 4.2 3.5 - 5.1 mmol/L   Chloride 103 98 - 111 mmol/L   CO2 18 (L) 22 - 32 mmol/L   Glucose, Bld 290 (H) 70 - 99 mg/dL   BUN 50 (H) 8 - 23 mg/dL   Creatinine, Ser 2.08 (H) 0.61 - 1.24 mg/dL   Calcium 8.8 (L) 8.9 - 10.3 mg/dL   GFR calc non Af Amer 32 (L) >60 mL/min   GFR calc Af Amer 38 (L) >60 mL/min   Anion gap 12 5 - 15    Comment: Performed at [facility], [address] [place], [place] [address]  Magnesium     Status: None   Collection Time: 05/21/19  8:51 AM  Result Value Ref Range   Magnesium 2.1 1.7 - 2.4 mg/dL    Comment: Performed at [facility], [address] [place], [place] [address]  Glucose, capillary     Status: Abnormal   Collection Time: 05/21/19 11:31 AM  Result Value Ref Range   Glucose-Capillary 239 (H) 70 - 99 mg/dL  Glucose, capillary     Status: Abnormal   Collection Time: 05/21/19  5:25 PM  Result Value Ref Range   Glucose-Capillary 236 (H) 70 - 99 mg/dL  Glucose, capillary     Status: Abnormal   Collection Time: 05/21/19  9:06 PM  Result Value Ref Range   Glucose-Capillary 367 (H) 70 - 99 mg/dL   Comment 1 Notify RN    Comment 2 Document in Chart   Glucose, capillary     Status: Abnormal   Collection Time: 05/22/19  7:39 AM  Result Value Ref Range   Glucose-Capillary 304 (H) 70 - 99 mg/dL  Basic metabolic panel     Status: Abnormal   Collection Time: 05/22/19  8:50 AM  Result Value Ref Range   Sodium 134 (L) 135 - 145 mmol/L   Potassium 4.0 3.5 - 5.1 mmol/L   Chloride 105 98 - 111 mmol/L   CO2 17 (L) 22 - 32 mmol/L   Glucose, Bld 297 (H) 70 - 99 mg/dL   BUN 56 (H) 8 -  23 mg/dL   Creatinine, Ser 2.06 (H) 0.61 - 1.24 mg/dL   Calcium 9.0 8.9 - 10.3 mg/dL   GFR calc non Af Amer 33 (L) >60  mL/min   GFR calc Af Amer 38 (L) >60 mL/min   Anion gap 12 5 - 15    Comment: Performed at [facility], [address] [place], [place] [address]  Glucose, capillary     Status: Abnormal   Collection Time: 05/22/19 11:31 AM  Result Value Ref Range   Glucose-Capillary 337 (H) 70 - 99 mg/dL  Glucose, capillary     Status: Abnormal   Collection Time: 05/22/19  4:44 PM  Result Value Ref Range   Glucose-Capillary 238 (H) 70 - 99 mg/dL  Glucose, capillary     Status: Abnormal   Collection Time: 05/22/19  9:20 PM  Result Value Ref Range   Glucose-Capillary 259 (H) 70 - 99 mg/dL  Basic metabolic panel     Status: Abnormal   Collection Time: 05/23/19  5:26 AM  Result Value Ref Range   Sodium 137 135 - 145 mmol/L   Potassium 3.8 3.5 - 5.1 mmol/L   Chloride 105 98 - 111 mmol/L   CO2 21 (L) 22 - 32 mmol/L   Glucose, Bld 285 (H) 70 - 99 mg/dL   BUN 62 (H) 8 - 23 mg/dL   Creatinine, Ser 2.11 (H) 0.61 - 1.24 mg/dL   Calcium 9.1 8.9 - 10.3 mg/dL   GFR calc non Af Amer 32 (L) >60 mL/min   GFR calc Af Amer 37 (L) >60 mL/min   Anion gap 11 5 - 15    Comment: Performed at [facility], [address] [place], [place] [address]  Glucose, capillary     Status: Abnormal   Collection Time: 05/23/19  7:29 AM  Result Value Ref Range   Glucose-Capillary 275 (H) 70 - 99 mg/dL  Glucose, capillary     Status: Abnormal   Collection Time: 05/23/19 11:12 AM  Result Value Ref Range   Glucose-Capillary 327 (H) 70 - 99 mg/dL  Glucose, capillary     Status: Abnormal   Collection Time: 05/23/19  4:16 PM  Result Value Ref Range   Glucose-Capillary 152 (H) 70 - 99 mg/dL  Glucose, capillary     Status: Abnormal   Collection Time: 05/23/19  9:19 PM  Result Value Ref Range   Glucose-Capillary 213 (H) 70 - 99 mg/dL  Glucose, capillary     Status: Abnormal   Collection Time: 05/24/19  8:45 AM  Result Value Ref Range   Glucose-Capillary 129 (H) 70 - 99 mg/dL  CBC with Differential      Status: Abnormal   Collection Time: 06/17/19  1:05 PM  Result Value Ref Range   WBC 9.0 4.0 - 10.5 K/uL   RBC 4.31 4.22 - 5.81 MIL/uL   Hemoglobin 12.1 (L) 13.0 - 17.0 g/dL   HCT 36.7 (L) 39.0 - 52.0 %   MCV 85.2 80.0 - 100.0 fL   MCH 28.1 26.0 - 34.0 pg   MCHC 33.0 30.0 - 36.0 g/dL   RDW 14.6 11.5 - 15.5 %   Platelets 242 150 - 400 K/uL   nRBC 0.0 0.0 - 0.2 %   Neutrophils Relative % 73 %   Neutro Abs 6.7 1.7 - 7.7 K/uL   Lymphocytes Relative 16 %   Lymphs Abs 1.4 0.7 - 4.0 K/uL   Monocytes Relative 8 %   Monocytes Absolute 0.7 0.1 -  1.0 K/uL   Eosinophils Relative 1 %   Eosinophils Absolute 0.1 0.0 - 0.5 K/uL   Basophils Relative 1 %   Basophils Absolute 0.1 0.0 - 0.1 K/uL   Immature Granulocytes 1 %   Abs Immature Granulocytes 0.05 0.00 - 0.07 K/uL    Comment: Performed at [facility], [address] [place], [place] [address]  Comprehensive metabolic panel     Status: Abnormal   Collection Time: 06/17/19  1:05 PM  Result Value Ref Range   Sodium 137 135 - 145 mmol/L   Potassium 3.1 (L) 3.5 - 5.1 mmol/L   Chloride 100 98 - 111 mmol/L   CO2 20 (L) 22 - 32 mmol/L   Glucose, Bld 158 (H) 70 - 99 mg/dL   BUN 60 (H) 8 - 23 mg/dL   Creatinine, Ser 2.16 (H) 0.61 - 1.24 mg/dL   Calcium 9.4 8.9 - 10.3 mg/dL   Total Protein 7.0 6.5 - 8.1 g/dL   Albumin 3.3 (L) 3.5 - 5.0 g/dL   AST 22 15 - 41 U/L   ALT 15 0 - 44 U/L   Alkaline Phosphatase 82 38 - 126 U/L   Total Bilirubin 0.8 0.3 - 1.2 mg/dL   GFR calc non Af Amer 31 (L) >60 mL/min   GFR calc Af Amer 36 (L) >60 mL/min   Anion gap 17 (H) 5 - 15    Comment: Performed at [facility], [address] [place], [place] [address]  Protime-INR     Status: None   Collection Time: 06/17/19  1:05 PM  Result Value Ref Range   Prothrombin Time 13.0 11.4 - 15.2 seconds   INR 1.0 0.8 - 1.2    Comment: (NOTE) INR goal varies based on device and disease states. Performed at [facility], [address] [place], [place] [address]   APTT     Status: None   Collection Time: 06/17/19  1:05 PM  Result Value Ref Range   aPTT 31 24 - 36 seconds    Comment: Performed at [facility], [address] [place], [place] [address]  Lipase, blood     Status: None   Collection Time: 06/17/19  1:05 PM  Result Value Ref Range   Lipase 32 11 - 51 U/L    Comment: Performed at [facility], [address] [place], [place] [address]  Urinalysis, Complete w Microscopic     Status: Abnormal   Collection Time: 06/17/19  1:06 PM  Result Value Ref Range   Color, Urine YELLOW (A) YELLOW   APPearance CLEAR (A) CLEAR   Specific Gravity, Urine 1.010 1.005 - 1.030   pH 5.0 5.0 - 8.0   Glucose, UA NEGATIVE NEGATIVE mg/dL   Hgb urine dipstick NEGATIVE NEGATIVE   Bilirubin Urine NEGATIVE NEGATIVE   Ketones, ur NEGATIVE NEGATIVE mg/dL   Protein, ur >=300 (A) NEGATIVE mg/dL   Nitrite NEGATIVE NEGATIVE   Leukocytes,Ua NEGATIVE NEGATIVE   RBC / HPF 0-5 0 - 5 RBC/hpf   WBC, UA 0-5 0 - 5 WBC/hpf   Bacteria, UA RARE (A) NONE SEEN   Squamous Epithelial / LPF NONE SEEN 0 - 5    Comment: Performed at [facility], [address] [place], [place] [address]  SARS CORONAVIRUS 2 (TAT 6-24 HRS) Nasopharyngeal Nasopharyngeal Swab     Status: None   Collection Time: 06/17/19  3:55 PM   Specimen: Nasopharyngeal Swab  Result Value Ref Range  SARS Coronavirus 2 NEGATIVE NEGATIVE    Comment: (NOTE) SARS-CoV-2 target nucleic acids are NOT DETECTED. The SARS-CoV-2 RNA is generally detectable in upper and lower respiratory specimens during the acute phase of infection. Negative results do not preclude SARS-CoV-2 infection, do not rule out co-infections with other pathogens, and should not be used as the sole basis for treatment or other patient management decisions. Negative results must be combined with clinical observations, patient history, and epidemiological information. The  expected result is Negative. Fact Sheet for Patients: SugarRoll.be Fact Sheet for Healthcare Providers: https://www.woods-mathews.com/ This test is not yet approved or cleared by the [place] FDA and  has been authorized for detection and/or diagnosis of SARS-CoV-2 by FDA under an Emergency Use Authorization (EUA). This EUA will remain  in effect (meaning this test can be used) for the duration of the COVID-19 declaration under Section 56 4(b)(1) of the Act, 21 U.S.C. section 360bbb-3(b)(1), unless the authorization is terminated or revoked sooner. Performed at [facility], [place] [address], [place], [place] [address]   Glucose, capillary     Status: Abnormal   Collection Time: 06/17/19 11:54 PM  Result Value Ref Range   Glucose-Capillary 191 (H) 70 - 99 mg/dL  Glucose, capillary     Status: Abnormal   Collection Time: 06/18/19  2:30 AM  Result Value Ref Range   Glucose-Capillary 118 (H) 70 - 99 mg/dL   Comment 1 Notify RN   Basic metabolic panel     Status: Abnormal   Collection Time: 06/18/19  6:14 AM  Result Value Ref Range   Sodium 137 135 - 145 mmol/L   Potassium 3.0 (L) 3.5 - 5.1 mmol/L   Chloride 102 98 - 111 mmol/L   CO2 24 22 - 32 mmol/L   Glucose, Bld 118 (H) 70 - 99 mg/dL   BUN 65 (H) 8 - 23 mg/dL   Creatinine, Ser 2.30 (H) 0.61 - 1.24 mg/dL   Calcium 9.0 8.9 - 10.3 mg/dL   GFR calc non Af Amer 29 (L) >60 mL/min   GFR calc Af Amer 33 (L) >60 mL/min   Anion gap 11 5 - 15    Comment: Performed at [facility], [address] [place], [place] [address]  CBC     Status: Abnormal   Collection Time: 06/18/19  6:14 AM  Result Value Ref Range   WBC 6.8 4.0 - 10.5 K/uL   RBC 3.97 (L) 4.22 - 5.81 MIL/uL   Hemoglobin 11.1 (L) 13.0 - 17.0 g/dL   HCT 34.4 (L) 39.0 - 52.0 %   MCV 86.6 80.0 - 100.0 fL   MCH 28.0 26.0 - 34.0 pg   MCHC 32.3 30.0 - 36.0 g/dL   RDW 14.7 11.5 - 15.5 %   Platelets 219 150 - 400 K/uL    nRBC 0.0 0.0 - 0.2 %    Comment: Performed at [facility], [address] [place], [place] [address]  Glucose, capillary     Status: Abnormal   Collection Time: 06/18/19  7:48 AM  Result Value Ref Range   Glucose-Capillary 116 (H) 70 - 99 mg/dL  Glucose, capillary     Status: Abnormal   Collection Time: 06/18/19 11:48 AM  Result Value Ref Range   Glucose-Capillary 227 (H) 70 - 99 mg/dL    Radiology: DG Chest 2 View  Result Date: 06/17/2019 CLINICAL DATA:  64 year old male with fall, cough, hemoptysis. EXAM: CHEST - 2 VIEW COMPARISON:  Chest radiographs 05/19/2019  and earlier. FINDINGS: Seated AP and lateral views of the chest. Patchy and asymmetric lower lung opacity suspected last month has resolved. No pneumothorax, pulmonary edema, pleural effusion or confluent pulmonary opacity. Mediastinal contours are stable and within normal limits. Visualized tracheal air column is within normal limits. Stable visualized osseous structures. Paucity of bowel gas in the upper abdomen. IMPRESSION: No acute cardiopulmonary abnormality. Electronically Signed   By: Genevie Ann M.D.   On: 06/17/2019 13:23   CT Lumbar Spine Wo Contrast  Result Date: 06/17/2019 CLINICAL DATA:  Fall.  Back pain EXAM: CT LUMBAR SPINE WITHOUT CONTRAST TECHNIQUE: Multidetector CT imaging of the lumbar spine was performed without intravenous contrast administration. Multiplanar CT image reconstructions were also generated. COMPARISON:  Lumbar spine radiographs 05/19/2019 FINDINGS: Segmentation: Normal Alignment: Normal Vertebrae: Negative for fracture Paraspinal and other soft tissues: Large right renal cyst, incompletely evaluated. 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 Incomplete evaluation. Atherosclerotic aorta, incompletely evaluated. Atherosclerotic calcification in the iliac arteries bilaterally. Prostate enlargement. No enlarged retroperitoneal lymph nodes  identified. Disc levels: L1-2: Mild degenerative change without stenosis L2-3: Mild disc bulging. Bilateral facet degeneration with mild spinal stenosis L3-4: Disc bulging and mild facet degeneration. Mild spinal stenosis L4-5: Diffuse disc bulging. Bilateral facet degeneration with mild spinal stenosis. L5-S1: Moderate facet degeneration bilaterally. Negative for stenosis. IMPRESSION: Negative for lumbar fracture. 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 Electronically Signed   By: Franchot Gallo M.D.   On: 06/17/2019 13:51   CT Hip Right Wo Contrast  Result Date: 06/17/2019 CLINICAL DATA:  Right hip pain after fall EXAM: CT OF THE RIGHT HIP WITHOUT CONTRAST TECHNIQUE: Multidetector CT imaging of the right hip was performed according to the standard protocol. Multiplanar CT image reconstructions were also generated. COMPARISON:  X-ray 06/17/2019, CT 04/24/2019 FINDINGS: Bones/Joint/Cartilage No acute fracture. Right hip joint is intact without dislocation. 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 Unchanged appearance of slightly fragmented osteophyte at the posterior acetabulum (series 12, image 39; series 6, image 39), compared to prior CT. Right SI joint and pubic symphysis are intact without diastasis. No lytic or sclerotic osseous lesion. No appreciable hip joint effusion. Ligaments Suboptimally assessed by CT. Muscles and Tendons Preservation of the muscle bulk. No obvious muscular or tendinous abnormality within the limitations of CT. Soft tissues No soft tissue fluid collection or hematoma. There scattered atherosclerotic calcifications. Scattered colonic diverticulosis. No acute findings within the visualized portion of the pelvis. IMPRESSION: 1. No acute fracture or dislocation of the right hip joint.  2. 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 Electronically Signed   By: Davina Poke D.O.   On: 06/17/2019 13:49   DG Hip Unilat  With Pelvis 2-3 Views Right  Result Date: 06/17/2019 CLINICAL DATA:  Pt states right hip pain after his legs gave out this am and he fell, states he can't walk at this time since fall/due to pain. EXAM: DG HIP (WITH OR WITHOUT PELVIS) 2-3V RIGHT COMPARISON:  Radiograph 05/19/2019 FINDINGS: Hips are located.  No pelvic fracture. Dedicated view of the RIGHT hip demonstrates a ring of osteophytes at the around the femoral neck not changed from comparison exam. No evidence of acute fracture dislocation. IMPRESSION: 1. Ring of osteophytes around the RIGHT femoral neck similar to comparison radiograph. 2. Severe joint space narrowing at  the RIGHT hip joint. Osteoarthritis of the hip joints, RIGHT greater than LEFT. 3. No acute fracture dislocation. Electronically Signed   By: Suzy Bouchard M.D.   On: 06/17/2019 12:34    No results found.  DG Chest 2 View  Result Date: 06/17/2019 CLINICAL DATA:  64 year old male with fall, cough, hemoptysis. EXAM: CHEST - 2 VIEW COMPARISON:  Chest radiographs 05/19/2019 and earlier. FINDINGS: Seated AP and lateral views of the chest. Patchy and asymmetric lower lung opacity suspected last month has resolved. No pneumothorax, pulmonary edema, pleural effusion or confluent pulmonary opacity. Mediastinal contours are stable and within normal limits. Visualized tracheal air column is within normal limits. Stable visualized osseous structures. Paucity of bowel gas in the upper abdomen. IMPRESSION: No acute cardiopulmonary abnormality. Electronically Signed   By: Genevie Ann M.D.   On: 06/17/2019 13:23   CT Lumbar Spine Wo Contrast  Result Date: 06/17/2019 CLINICAL DATA:  Fall.  Back pain EXAM: CT LUMBAR SPINE WITHOUT CONTRAST TECHNIQUE: Multidetector CT imaging of the lumbar spine was performed without intravenous contrast administration.  Multiplanar CT image reconstructions were also generated. COMPARISON:  Lumbar spine radiographs 05/19/2019 FINDINGS: Segmentation: Normal Alignment: Normal Vertebrae: Negative for fracture Paraspinal and other soft tissues: Large right renal cyst, incompletely evaluated. 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 Incomplete evaluation. Atherosclerotic aorta, incompletely evaluated. Atherosclerotic calcification in the iliac arteries bilaterally. Prostate enlargement. No enlarged retroperitoneal lymph nodes identified. Disc levels: L1-2: Mild degenerative change without stenosis L2-3: Mild disc bulging. Bilateral facet degeneration with mild spinal stenosis L3-4: Disc bulging and mild facet degeneration. Mild spinal stenosis L4-5: Diffuse disc bulging. Bilateral facet degeneration with mild spinal stenosis. L5-S1: Moderate facet degeneration bilaterally. Negative for stenosis. IMPRESSION: Negative for lumbar fracture. 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 Electronically Signed   By: Franchot Gallo M.D.   On: 06/17/2019 13:51   CT Hip Right Wo Contrast  Result Date: 06/17/2019 CLINICAL DATA:  Right hip pain after fall EXAM: CT OF THE RIGHT HIP WITHOUT CONTRAST TECHNIQUE: Multidetector CT imaging of the right hip was performed according to the standard protocol. Multiplanar CT image reconstructions were also generated. COMPARISON:  X-ray 06/17/2019, CT 04/24/2019 FINDINGS: Bones/Joint/Cartilage No acute fracture. Right hip joint is intact without dislocation. 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 Unchanged appearance of slightly fragmented osteophyte at the posterior acetabulum (series 12, image 39; series 6, image 39), compared to prior CT. Right SI joint  and pubic symphysis are intact without diastasis. No lytic or sclerotic osseous lesion. No appreciable hip joint effusion. Ligaments Suboptimally assessed by CT. Muscles and Tendons Preservation of the muscle bulk. No obvious muscular or tendinous abnormality within the limitations of CT. Soft tissues No soft tissue fluid collection or hematoma. There scattered atherosclerotic calcifications. Scattered colonic diverticulosis. No acute findings within the visualized portion of the pelvis. IMPRESSION: 1. No acute fracture or dislocation of the right hip joint. 2. 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 Electronically Signed   By: Davina Poke D.O.   On: 06/17/2019 13:49   DG Hip Unilat  With Pelvis 2-3 Views Right  Result Date: 06/17/2019 CLINICAL DATA:  Pt states right hip pain after his legs gave out this am and he fell, states he can't walk at this time since fall/due to pain. EXAM: DG HIP (  WITH OR WITHOUT PELVIS) 2-3V RIGHT COMPARISON:  Radiograph 05/19/2019 FINDINGS: Hips are located.  No pelvic fracture. Dedicated view of the RIGHT hip demonstrates a ring of osteophytes at the around the femoral neck not changed from comparison exam. No evidence of acute fracture dislocation. IMPRESSION: 1. Ring of osteophytes around the RIGHT femoral neck similar to comparison radiograph. 2. Severe joint space narrowing at the RIGHT hip joint. Osteoarthritis of the hip joints, RIGHT greater than LEFT. 3. No acute fracture dislocation. Electronically Signed   By: Suzy Bouchard M.D.   On: 06/17/2019 12:34      Assessment and Plan: Patient Active Problem List   Diagnosis Date Noted  . Acute right hip pain 06/17/2019  . Inability to ambulate due to hip 06/17/2019  . CKD stage 3 due to type 2 diabetes mellitus ([place])   . Hypervolemia   . Acute on chronic diastolic CHF (congestive heart failure) ([place])   . Lobar pneumonia ([place])   . Type 2 diabetes mellitus with stage 3b chronic kidney disease, with  long-term current use of insulin ([place])   . Hypokalemia   . Hypomagnesemia   . Full code status 05/20/2019  . Pleuritic chest pain   . CAP (community acquired pneumonia) 05/19/2019  . AKI (acute kidney injury) ([place]) 05/09/2019  . Hemoptysis 04/09/2019  . Shortness of breath 03/26/2019  . Uncontrolled type 2 diabetes mellitus with insulin therapy ([place]) 03/12/2019  . Acute on chronic heart failure with preserved ejection fraction (HFpEF) ([place])   . Acute kidney injury superimposed on chronic kidney disease ([place])   . CHF (congestive heart failure) ([place]) 03/01/2019  . Diastolic dysfunction 74/25/9563  . Iron deficiency anemia 12/31/2018  . Atopic dermatitis 11/24/2018  . Screening for colon cancer 11/06/2017  . Uncontrolled type 2 diabetes mellitus with hyperglycemia ([place]) 11/06/2017  . Dermatitis, unspecified 06/09/2017  . Atherosclerotic heart disease of native coronary artery without angina pectoris 06/09/2017  . 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 Obstructive sleep apnea, adult 06/09/2017  . Morbid (severe) obesity due to excess calories ([place]) 06/09/2017  . Cigarette nicotine dependence with nicotine-induced disorder 06/09/2017  . Type 2 diabetes mellitus with diabetic polyneuropathy ([place]) 06/09/2017  . Allergic rhinitis due to pollen 06/09/2017  . Mixed hyperlipidemia 06/09/2017  . COPD with acute exacerbation ([place]) 06/09/2017  . Dyspnea on exertion 06/09/2017  . Wheezing 06/09/2017  . Dysuria 06/09/2017  . Snoring 06/09/2017  . Essential hypertension 06/09/2017  . Personal history of other malignant neoplasm of kidney 06/09/2017  . Pain in right hip 06/09/2017  . Impacted cerumen, bilateral 06/09/2017  . Secondary hyperparathyroidism, not elsewhere classified ([place]) 06/09/2017  . Type 2 diabetes mellitus with hyperglycemia ([place]) 06/09/2017  . Tinea corporis 06/09/2017    1. Hemoptysis unclear etiology there has been no definitive finding  of hemoptysis on bronchoscopy.  Patient also has had CT scans which have been unremarkable.  It is therefore unclear as to what is causing his symptoms.  I recommend that we continue to monitor him closely has been once again instructed that if he has any massive episode that he should be going straight to the emergency department.  Alternatively this also could be due to chronic bronchitis which would be the most common cause of intermittent hemoptysis. 2. COPD this is been under control the plan is going to be to continue with current management encourage use of inhalers.  Encourage compliance with recommended therapies. 3. Chronic congestive heart failure as an alternate  this also could be giving intermittent hemoptysis and he has had an echocardiogram done which has shown an EF between 45 and 50%.  With some mild left-sided systolic dysfunction.  The right-sided chambers actually looked like normal function. 4. Allergic rhinitis this has been under control we will continue with supportive care.  General Counseling: I have discussed the findings of the evaluation and examination with Larry Callahan.  I have also discussed any further diagnostic evaluation thatmay be needed or ordered today. Talib verbalizes understanding of the findings of todays visit. We also reviewed his medications today and discussed drug interactions and side effects including but not limited excessive drowsiness and altered mental states. We also discussed that there is always a risk not just to him but also people around him. he has been encouraged to call the office with any questions or concerns that should arise related to todays visit.  No orders of the defined types were placed in this encounter.    Time spent: Minutes  I have personally obtained a history, examined the patient, evaluated laboratory and imaging results, formulated the assessment and plan and placed orders.    [name], MD [facility] Sleep medicine

## 2019-07-15 DIAGNOSIS — I110 Hypertensive heart disease with heart failure: Secondary | ICD-10-CM | POA: Diagnosis not present

## 2019-07-15 DIAGNOSIS — N183 Chronic kidney disease, stage 3 unspecified: Secondary | ICD-10-CM | POA: Diagnosis not present

## 2019-07-15 DIAGNOSIS — E1142 Type 2 diabetes mellitus with diabetic polyneuropathy: Secondary | ICD-10-CM | POA: Diagnosis not present

## 2019-07-15 DIAGNOSIS — I5030 Unspecified diastolic (congestive) heart failure: Secondary | ICD-10-CM | POA: Diagnosis not present

## 2019-07-15 DIAGNOSIS — M160 Bilateral primary osteoarthritis of hip: Secondary | ICD-10-CM | POA: Diagnosis not present

## 2019-07-15 DIAGNOSIS — E1122 Type 2 diabetes mellitus with diabetic chronic kidney disease: Secondary | ICD-10-CM | POA: Diagnosis not present

## 2019-07-15 DIAGNOSIS — G4733 Obstructive sleep apnea (adult) (pediatric): Secondary | ICD-10-CM | POA: Diagnosis not present

## 2019-07-15 DIAGNOSIS — I2510 Atherosclerotic heart disease of native coronary artery without angina pectoris: Secondary | ICD-10-CM | POA: Diagnosis not present

## 2019-07-15 DIAGNOSIS — J441 Chronic obstructive pulmonary disease with (acute) exacerbation: Secondary | ICD-10-CM | POA: Diagnosis not present

## 2019-07-18 DIAGNOSIS — E1142 Type 2 diabetes mellitus with diabetic polyneuropathy: Secondary | ICD-10-CM | POA: Diagnosis not present

## 2019-07-18 DIAGNOSIS — I110 Hypertensive heart disease with heart failure: Secondary | ICD-10-CM | POA: Diagnosis not present

## 2019-07-18 DIAGNOSIS — N183 Chronic kidney disease, stage 3 unspecified: Secondary | ICD-10-CM | POA: Diagnosis not present

## 2019-07-18 DIAGNOSIS — J441 Chronic obstructive pulmonary disease with (acute) exacerbation: Secondary | ICD-10-CM | POA: Diagnosis not present

## 2019-07-18 DIAGNOSIS — I2510 Atherosclerotic heart disease of native coronary artery without angina pectoris: Secondary | ICD-10-CM | POA: Diagnosis not present

## 2019-07-18 DIAGNOSIS — M160 Bilateral primary osteoarthritis of hip: Secondary | ICD-10-CM | POA: Diagnosis not present

## 2019-07-18 DIAGNOSIS — I5030 Unspecified diastolic (congestive) heart failure: Secondary | ICD-10-CM | POA: Diagnosis not present

## 2019-07-18 DIAGNOSIS — G4733 Obstructive sleep apnea (adult) (pediatric): Secondary | ICD-10-CM | POA: Diagnosis not present

## 2019-07-18 DIAGNOSIS — E1122 Type 2 diabetes mellitus with diabetic chronic kidney disease: Secondary | ICD-10-CM | POA: Diagnosis not present

## 2019-07-20 DIAGNOSIS — E1142 Type 2 diabetes mellitus with diabetic polyneuropathy: Secondary | ICD-10-CM | POA: Diagnosis not present

## 2019-07-20 DIAGNOSIS — N183 Chronic kidney disease, stage 3 unspecified: Secondary | ICD-10-CM | POA: Diagnosis not present

## 2019-07-20 DIAGNOSIS — I5030 Unspecified diastolic (congestive) heart failure: Secondary | ICD-10-CM | POA: Diagnosis not present

## 2019-07-20 DIAGNOSIS — I2510 Atherosclerotic heart disease of native coronary artery without angina pectoris: Secondary | ICD-10-CM | POA: Diagnosis not present

## 2019-07-20 DIAGNOSIS — G4733 Obstructive sleep apnea (adult) (pediatric): Secondary | ICD-10-CM | POA: Diagnosis not present

## 2019-07-20 DIAGNOSIS — M160 Bilateral primary osteoarthritis of hip: Secondary | ICD-10-CM | POA: Diagnosis not present

## 2019-07-20 DIAGNOSIS — J441 Chronic obstructive pulmonary disease with (acute) exacerbation: Secondary | ICD-10-CM | POA: Diagnosis not present

## 2019-07-20 DIAGNOSIS — I110 Hypertensive heart disease with heart failure: Secondary | ICD-10-CM | POA: Diagnosis not present

## 2019-07-20 DIAGNOSIS — E1122 Type 2 diabetes mellitus with diabetic chronic kidney disease: Secondary | ICD-10-CM | POA: Diagnosis not present

## 2019-07-22 ENCOUNTER — Other Ambulatory Visit: Payer: Self-pay

## 2019-07-22 DIAGNOSIS — I5030 Unspecified diastolic (congestive) heart failure: Secondary | ICD-10-CM | POA: Diagnosis not present

## 2019-07-22 DIAGNOSIS — M160 Bilateral primary osteoarthritis of hip: Secondary | ICD-10-CM | POA: Diagnosis not present

## 2019-07-22 DIAGNOSIS — N183 Chronic kidney disease, stage 3 unspecified: Secondary | ICD-10-CM | POA: Diagnosis not present

## 2019-07-22 DIAGNOSIS — J441 Chronic obstructive pulmonary disease with (acute) exacerbation: Secondary | ICD-10-CM | POA: Diagnosis not present

## 2019-07-22 DIAGNOSIS — G4733 Obstructive sleep apnea (adult) (pediatric): Secondary | ICD-10-CM | POA: Diagnosis not present

## 2019-07-22 DIAGNOSIS — E1122 Type 2 diabetes mellitus with diabetic chronic kidney disease: Secondary | ICD-10-CM | POA: Diagnosis not present

## 2019-07-22 DIAGNOSIS — I2510 Atherosclerotic heart disease of native coronary artery without angina pectoris: Secondary | ICD-10-CM | POA: Diagnosis not present

## 2019-07-22 DIAGNOSIS — E1142 Type 2 diabetes mellitus with diabetic polyneuropathy: Secondary | ICD-10-CM | POA: Diagnosis not present

## 2019-07-22 DIAGNOSIS — I110 Hypertensive heart disease with heart failure: Secondary | ICD-10-CM | POA: Diagnosis not present

## 2019-07-24 DIAGNOSIS — E1122 Type 2 diabetes mellitus with diabetic chronic kidney disease: Secondary | ICD-10-CM | POA: Diagnosis not present

## 2019-07-24 DIAGNOSIS — N183 Chronic kidney disease, stage 3 unspecified: Secondary | ICD-10-CM | POA: Diagnosis not present

## 2019-07-24 DIAGNOSIS — I2510 Atherosclerotic heart disease of native coronary artery without angina pectoris: Secondary | ICD-10-CM | POA: Diagnosis not present

## 2019-07-24 DIAGNOSIS — J441 Chronic obstructive pulmonary disease with (acute) exacerbation: Secondary | ICD-10-CM | POA: Diagnosis not present

## 2019-07-24 DIAGNOSIS — I110 Hypertensive heart disease with heart failure: Secondary | ICD-10-CM | POA: Diagnosis not present

## 2019-07-24 DIAGNOSIS — G4733 Obstructive sleep apnea (adult) (pediatric): Secondary | ICD-10-CM | POA: Diagnosis not present

## 2019-07-24 DIAGNOSIS — I5030 Unspecified diastolic (congestive) heart failure: Secondary | ICD-10-CM | POA: Diagnosis not present

## 2019-07-24 DIAGNOSIS — M160 Bilateral primary osteoarthritis of hip: Secondary | ICD-10-CM | POA: Diagnosis not present

## 2019-07-24 DIAGNOSIS — E1142 Type 2 diabetes mellitus with diabetic polyneuropathy: Secondary | ICD-10-CM | POA: Diagnosis not present

## 2019-07-25 ENCOUNTER — Other Ambulatory Visit: Payer: Self-pay

## 2019-07-25 MED ORDER — SPIRIVA HANDIHALER 18 MCG IN CAPS: 18.0000 ug | ORAL_CAPSULE | Freq: Every day | RESPIRATORY_TRACT | 3 refills | Status: DC

## 2019-07-29 DIAGNOSIS — N183 Chronic kidney disease, stage 3 unspecified: Secondary | ICD-10-CM | POA: Diagnosis not present

## 2019-07-29 DIAGNOSIS — J441 Chronic obstructive pulmonary disease with (acute) exacerbation: Secondary | ICD-10-CM | POA: Diagnosis not present

## 2019-07-29 DIAGNOSIS — I110 Hypertensive heart disease with heart failure: Secondary | ICD-10-CM | POA: Diagnosis not present

## 2019-07-29 DIAGNOSIS — E1142 Type 2 diabetes mellitus with diabetic polyneuropathy: Secondary | ICD-10-CM | POA: Diagnosis not present

## 2019-07-29 DIAGNOSIS — I5030 Unspecified diastolic (congestive) heart failure: Secondary | ICD-10-CM | POA: Diagnosis not present

## 2019-07-29 DIAGNOSIS — I2510 Atherosclerotic heart disease of native coronary artery without angina pectoris: Secondary | ICD-10-CM | POA: Diagnosis not present

## 2019-07-29 DIAGNOSIS — M160 Bilateral primary osteoarthritis of hip: Secondary | ICD-10-CM | POA: Diagnosis not present

## 2019-07-29 DIAGNOSIS — G4733 Obstructive sleep apnea (adult) (pediatric): Secondary | ICD-10-CM | POA: Diagnosis not present

## 2019-07-29 DIAGNOSIS — E1122 Type 2 diabetes mellitus with diabetic chronic kidney disease: Secondary | ICD-10-CM | POA: Diagnosis not present

## 2019-07-30 ENCOUNTER — Other Ambulatory Visit: Payer: Self-pay

## 2019-07-30 DIAGNOSIS — E1165 Type 2 diabetes mellitus with hyperglycemia: Secondary | ICD-10-CM

## 2019-07-30 DIAGNOSIS — IMO0002 Reserved for concepts with insufficient information to code with codable children: Secondary | ICD-10-CM

## 2019-07-30 DIAGNOSIS — D509 Iron deficiency anemia, unspecified: Secondary | ICD-10-CM

## 2019-07-30 MED ORDER — TRUEPLUS LANCETS 33G MISC: 3 refills | Status: DC

## 2019-07-30 MED ORDER — FERROUS GLUCONATE 324 (38 FE) MG PO TABS: 324.0000 mg | ORAL_TABLET | Freq: Every day | ORAL | 3 refills | Status: DC

## 2019-07-30 MED ORDER — FUROSEMIDE 80 MG PO TABS: 80.0000 mg | ORAL_TABLET | Freq: Two times a day (BID) | ORAL | 0 refills | Status: DC

## 2019-07-30 MED ORDER — GLIMEPIRIDE 2 MG PO TABS: 2.0000 mg | ORAL_TABLET | Freq: Every day | ORAL | 3 refills | Status: DC

## 2019-07-30 MED ORDER — TRUE METRIX BLOOD GLUCOSE TEST VI STRP: ORAL_STRIP | 3 refills | Status: DC

## 2019-08-01 ENCOUNTER — Other Ambulatory Visit: Payer: Self-pay

## 2019-08-01 DIAGNOSIS — D509 Iron deficiency anemia, unspecified: Secondary | ICD-10-CM

## 2019-08-01 MED ORDER — FUROSEMIDE 80 MG PO TABS: 80.0000 mg | ORAL_TABLET | Freq: Two times a day (BID) | ORAL | 0 refills | Status: DC

## 2019-08-01 MED ORDER — AMLODIPINE BESYLATE 10 MG PO TABS: 10.0000 mg | ORAL_TABLET | Freq: Every day | ORAL | 2 refills | Status: DC

## 2019-08-01 MED ORDER — FERROUS GLUCONATE 324 (38 FE) MG PO TABS: 324.0000 mg | ORAL_TABLET | Freq: Every day | ORAL | 3 refills | Status: DC

## 2019-08-02 ENCOUNTER — Other Ambulatory Visit: Payer: Self-pay

## 2019-08-02 ENCOUNTER — Telehealth: Payer: Self-pay | Admitting: Cardiovascular Disease

## 2019-08-02 DIAGNOSIS — IMO0002 Reserved for concepts with insufficient information to code with codable children: Secondary | ICD-10-CM

## 2019-08-02 DIAGNOSIS — E1165 Type 2 diabetes mellitus with hyperglycemia: Secondary | ICD-10-CM

## 2019-08-02 MED ORDER — ISOSORBIDE MONONITRATE ER 30 MG PO TB24: 30.0000 mg | ORAL_TABLET | Freq: Every day | ORAL | 1 refills | Status: DC

## 2019-08-02 MED ORDER — GLIMEPIRIDE 2 MG PO TABS: 2.0000 mg | ORAL_TABLET | Freq: Every day | ORAL | 3 refills | Status: DC

## 2019-08-02 MED ORDER — HYDRALAZINE HCL 100 MG PO TABS: 100.0000 mg | ORAL_TABLET | Freq: Three times a day (TID) | ORAL | 1 refills | Status: DC

## 2019-08-02 MED ORDER — SPIRIVA HANDIHALER 18 MCG IN CAPS: 18.0000 ug | ORAL_CAPSULE | Freq: Every day | RESPIRATORY_TRACT | 3 refills | Status: DC

## 2019-08-02 NOTE — Telephone Encounter (Signed)
error 

## 2019-08-03 ENCOUNTER — Telehealth (HOSPITAL_COMMUNITY): Payer: Self-pay

## 2019-08-04 ENCOUNTER — Emergency Department: Payer: Medicare PPO

## 2019-08-04 ENCOUNTER — Other Ambulatory Visit: Payer: Self-pay

## 2019-08-04 ENCOUNTER — Emergency Department
Admission: EM | Admit: 2019-08-04 | Discharge: 2019-08-05 | Disposition: A | Discharge status: Home / Self Care | Payer: Medicare PPO | Attending: Emergency Medicine | Admitting: Emergency Medicine

## 2019-08-04 DIAGNOSIS — M79604 Pain in right leg: Secondary | ICD-10-CM | POA: Insufficient documentation

## 2019-08-04 DIAGNOSIS — I130 Hypertensive heart and chronic kidney disease with heart failure and stage 1 through stage 4 chronic kidney disease, or unspecified chronic kidney disease: Secondary | ICD-10-CM | POA: Diagnosis not present

## 2019-08-04 DIAGNOSIS — E1122 Type 2 diabetes mellitus with diabetic chronic kidney disease: Secondary | ICD-10-CM | POA: Diagnosis not present

## 2019-08-04 DIAGNOSIS — Z85528 Personal history of other malignant neoplasm of kidney: Secondary | ICD-10-CM | POA: Insufficient documentation

## 2019-08-04 DIAGNOSIS — M6281 Muscle weakness (generalized): Secondary | ICD-10-CM | POA: Diagnosis not present

## 2019-08-04 DIAGNOSIS — Z79899 Other long term (current) drug therapy: Secondary | ICD-10-CM | POA: Diagnosis not present

## 2019-08-04 DIAGNOSIS — N189 Chronic kidney disease, unspecified: Secondary | ICD-10-CM | POA: Insufficient documentation

## 2019-08-04 DIAGNOSIS — M79661 Pain in right lower leg: Secondary | ICD-10-CM | POA: Diagnosis not present

## 2019-08-04 DIAGNOSIS — G629 Polyneuropathy, unspecified: Secondary | ICD-10-CM

## 2019-08-04 DIAGNOSIS — Z87891 Personal history of nicotine dependence: Secondary | ICD-10-CM | POA: Insufficient documentation

## 2019-08-04 DIAGNOSIS — E1143 Type 2 diabetes mellitus with diabetic autonomic (poly)neuropathy: Secondary | ICD-10-CM | POA: Insufficient documentation

## 2019-08-04 DIAGNOSIS — R531 Weakness: Secondary | ICD-10-CM | POA: Insufficient documentation

## 2019-08-04 DIAGNOSIS — M79605 Pain in left leg: Secondary | ICD-10-CM | POA: Insufficient documentation

## 2019-08-04 DIAGNOSIS — Z794 Long term (current) use of insulin: Secondary | ICD-10-CM | POA: Insufficient documentation

## 2019-08-04 DIAGNOSIS — M79662 Pain in left lower leg: Secondary | ICD-10-CM | POA: Diagnosis not present

## 2019-08-04 DIAGNOSIS — I5030 Unspecified diastolic (congestive) heart failure: Secondary | ICD-10-CM | POA: Diagnosis not present

## 2019-08-04 LAB — URINALYSIS, COMPLETE (UACMP) WITH MICROSCOPIC
Bilirubin Urine: NEGATIVE
Glucose, UA: NEGATIVE mg/dL
Hgb urine dipstick: NEGATIVE
Ketones, ur: NEGATIVE mg/dL
Leukocytes,Ua: NEGATIVE
Nitrite: NEGATIVE
Protein, ur: 100 mg/dL — AB
Specific Gravity, Urine: 1.011 (ref 1.005–1.030)
pH: 5 (ref 5.0–8.0)

## 2019-08-04 LAB — CK: Total CK: 138 U/L (ref 49–397)

## 2019-08-04 LAB — COMPREHENSIVE METABOLIC PANEL
ALT: 16 U/L (ref 0–44)
AST: 19 U/L (ref 15–41)
Albumin: 3.1 g/dL — ABNORMAL LOW (ref 3.5–5.0)
Alkaline Phosphatase: 80 U/L (ref 38–126)
Anion gap: 14 (ref 5–15)
BUN: 53 mg/dL — ABNORMAL HIGH (ref 8–23)
CO2: 21 mmol/L — ABNORMAL LOW (ref 22–32)
Calcium: 9 mg/dL (ref 8.9–10.3)
Chloride: 103 mmol/L (ref 98–111)
Creatinine, Ser: 2.09 mg/dL — ABNORMAL HIGH (ref 0.61–1.24)
GFR calc Af Amer: 37 mL/min — ABNORMAL LOW (ref >60)
GFR calc non Af Amer: 32 mL/min — ABNORMAL LOW (ref >60)
Glucose, Bld: 135 mg/dL — ABNORMAL HIGH (ref 70–99)
Potassium: 3.1 mmol/L — ABNORMAL LOW (ref 3.5–5.1)
Sodium: 138 mmol/L (ref 135–145)
Total Bilirubin: 0.7 mg/dL (ref 0.3–1.2)
Total Protein: 7.1 g/dL (ref 6.5–8.1)

## 2019-08-04 LAB — CBC
HCT: 33.4 % — ABNORMAL LOW (ref 39.0–52.0)
Hemoglobin: 10.9 g/dL — ABNORMAL LOW (ref 13.0–17.0)
MCH: 28.5 pg (ref 26.0–34.0)
MCHC: 32.6 g/dL (ref 30.0–36.0)
MCV: 87.2 fL (ref 80.0–100.0)
Platelets: 273 10*3/uL (ref 150–400)
RBC: 3.83 MIL/uL — ABNORMAL LOW (ref 4.22–5.81)
RDW: 13.4 % (ref 11.5–15.5)
WBC: 10.8 10*3/uL — ABNORMAL HIGH (ref 4.0–10.5)
nRBC: 0 % (ref 0.0–0.2)

## 2019-08-04 MED ORDER — OXYCODONE HCL 5 MG PO TABS
5.0000 mg | ORAL_TABLET | Freq: Once | ORAL | Status: AC
  Administered 2019-08-04: 5 mg via ORAL
  Filled 2019-08-04: qty 1

## 2019-08-04 MED ORDER — ACETAMINOPHEN 500 MG PO TABS
1000.0000 mg | ORAL_TABLET | Freq: Once | ORAL | Status: AC
  Administered 2019-08-04: 1000 mg via ORAL
  Filled 2019-08-04: qty 2

## 2019-08-04 MED ORDER — GABAPENTIN 600 MG PO TABS
300.0000 mg | ORAL_TABLET | Freq: Once | ORAL | Status: AC
  Administered 2019-08-04: 300 mg via ORAL
  Filled 2019-08-04: qty 1

## 2019-08-04 NOTE — ED Triage Notes (Signed)
Patient c/o lower bilateral extremity weakness X 3 days. Patient c/o bilateral foot pain radiated at 10 of 10.

## 2019-08-04 NOTE — ED Notes (Signed)
ED Provider at bedside. 

## 2019-08-04 NOTE — ED Provider Notes (Signed)
[facility] Emergency Department Provider Note  ____________________________________________   First MD Initiated Contact with Patient 08/04/19 2234     (approximate)  I have reviewed the triage vital signs and the nursing notes.  History  Chief Complaint Extremity Weakness    HPI Larry Callahan is a 64 y.o. male past medical history as below, including diabetes, who presents to the emergency department for bilateral lower extremity pain.  Patient states pain starts from his distal thigh and extends through his feet bilaterally.  He describes the pain as a pins-and-needles, stabbing type sensation bilaterally.  Pain is constant, 10/10 in severity, no alleviating or aggravating components.  This is been ongoing and constant for the last 3 days and has been making it difficult for him to walk, prompting him to call EMS today.  He denies any related trauma.  No related swelling.   Past Medical Hx Past Medical History:  Diagnosis Date  . Allergy   . Coronary artery disease   . Diabetes mellitus without complication ([place])   . Diastolic CHF ([place])   . GERD (gastroesophageal reflux disease)   . Hyperlipidemia   . Hypertension   . Renal insufficiency   . Sleep apnea     Problem List Patient Active Problem List   Diagnosis Date Noted  . Acute right hip pain 06/17/2019  . Inability to ambulate due to hip 06/17/2019  . CKD stage 3 due to type 2 diabetes mellitus ([place])   . Hypervolemia   . Acute on chronic diastolic CHF (congestive heart failure) ([place])   . Lobar pneumonia ([place])   . Type 2 diabetes mellitus with stage 3b chronic kidney disease, with long-term current use of insulin ([place])   . Hypokalemia   . Hypomagnesemia   . Full code status 05/20/2019  . Pleuritic chest pain   . CAP (community acquired pneumonia) 05/19/2019  . AKI (acute kidney injury) ([place]) 05/09/2019  . Hemoptysis 04/09/2019  . Shortness of breath 03/26/2019  . Uncontrolled  type 2 diabetes mellitus with insulin therapy ([place]) 03/12/2019  . Acute on chronic heart failure with preserved ejection fraction (HFpEF) ([place])   . Acute kidney injury superimposed on chronic kidney disease ([place])   . CHF (congestive heart failure) ([place]) 03/01/2019  . Diastolic dysfunction 29/93/7169  . Iron deficiency anemia 12/31/2018  . Atopic dermatitis 11/24/2018  . Screening for colon cancer 11/06/2017  . Uncontrolled type 2 diabetes mellitus with hyperglycemia ([place]) 11/06/2017  . Dermatitis, unspecified 06/09/2017  . Atherosclerotic heart disease of native coronary artery without angina pectoris 06/09/2017  . 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 Obstructive sleep apnea, adult 06/09/2017  . Morbid (severe) obesity due to excess calories ([place]) 06/09/2017  . Cigarette nicotine dependence with nicotine-induced disorder 06/09/2017  . Type 2 diabetes mellitus with diabetic polyneuropathy ([place]) 06/09/2017  . Allergic rhinitis due to pollen 06/09/2017  . Mixed hyperlipidemia 06/09/2017  . COPD with acute exacerbation ([address]) 06/09/2017  . Dyspnea on exertion 06/09/2017  . Wheezing 06/09/2017  . Dysuria 06/09/2017  . Snoring 06/09/2017  . Essential hypertension 06/09/2017  . Personal history of other malignant neoplasm of kidney 06/09/2017  . Pain in right hip 06/09/2017  . Impacted cerumen, bilateral 06/09/2017  . Secondary hyperparathyroidism, not elsewhere classified ([place]) 06/09/2017  . Type 2 diabetes mellitus with hyperglycemia ([place]) 06/09/2017  . Tinea corporis 06/09/2017    Past Surgical Hx Past Surgical History:  Procedure Laterality Date  . BACK SURGERY    .  CHOLECYSTECTOMY    . COLONOSCOPY WITH PROPOFOL N/A 04/12/2019   Procedure: COLONOSCOPY WITH PROPOFOL;  Surgeon: [name], MD;  Location: [facility] ENDOSCOPY;  Service: Gastroenterology;  Laterality: N/A;  . ESOPHAGOGASTRODUODENOSCOPY N/A 04/12/2019   Procedure:  ESOPHAGOGASTRODUODENOSCOPY (EGD);  Surgeon: [name], MD;  Location: [facility] ENDOSCOPY;  Service: Gastroenterology;  Laterality: N/A;  . FLEXIBLE BRONCHOSCOPY Bilateral 05/17/2019   Procedure: FLEXIBLE BRONCHOSCOPY;  Surgeon: [name], MD;  Location: [name] ORS;  Service: Pulmonary;  Laterality: Bilateral;  . left arm surgery    . nephrectomy Left   . PARATHYROIDECTOMY    . RIGHT HEART CATH N/A 03/29/2019   Procedure: RIGHT HEART CATH;  Surgeon: [name], MD;  Location: [place] CV LAB;  Service: Cardiovascular;  Laterality: N/A;    Medications Prior to Admission medications   Medication Sig Start Date End Date Taking? Authorizing Provider  acetaminophen (TYLENOL) 325 MG tablet Take 2 tablets (650 mg total) by mouth every 4 (four) hours as needed for headache or mild pain. 03/05/19   Gouru, Illene Silver, MD  albuterol (VENTOLIN HFA) 108 (90 Base) MCG/ACT inhaler Inhale 2 puffs into the lungs every 6 (six) hours as needed for wheezing or shortness of breath. 03/05/19   Gouru, Illene Silver, MD  amLODipine (NORVASC) 10 MG tablet Take 1 tablet (10 mg total) by mouth daily. 08/01/19   [name], NP  Blood Glucose Calibration (TRUE METRIX LEVEL 1) Low SOLN Use as directed 04/04/18   [name], NP  Blood Glucose Monitoring Suppl (TRUE METRIX AIR GLUCOSE METER) DEVI 1 Device by Does not apply route 2 (two) times daily. 04/13/18   [name], NP  ferrous gluconate (FERGON) 324 MG tablet Take 1 tablet (324 mg total) by mouth daily with breakfast. 08/01/19   [name], NP  furosemide (LASIX) 80 MG tablet Take 1 tablet (80 mg total) by mouth 2 (two) times daily. 08/01/19   [name], NP  gemfibrozil (LOPID) 600 MG tablet Take 1 tablet (600 mg total) by mouth 2 (two) times daily with a meal. 06/27/19   Boscia, Greer Ee, NP  glimepiride (AMARYL) 2 MG tablet Take 1 tablet (2 mg total) by mouth at bedtime. 08/02/19   Lavera Guise, MD  glucose blood (TRUE METRIX BLOOD  GLUCOSE TEST) test strip Use as instructed twice daily diag E11.65 07/30/19   [name], NP  guaiFENesin (MUCINEX) 600 MG 12 hr tablet Take 1 tablet (600 mg total) by mouth 2 (two) times daily. 06/18/19   Lavina Hamman, MD  hydrALAZINE (APRESOLINE) 100 MG tablet Take 1 tablet (100 mg total) by mouth 3 (three) times daily. 08/02/19   Lavera Guise, MD  Insulin Glargine, 1 Unit Dial, (TOUJEO SOLOSTAR) 300 UNIT/ML SOPN Inject 26 Units into the skin every evening. 03/13/19 06/17/19  [name], NP  ipratropium-albuterol (DUONEB) 0.5-2.5 (3) MG/3ML SOLN Take 3 mLs by nebulization every 6 (six) hours as needed. 03/08/19   Kendell Bane, NP  isosorbide mononitrate (IMDUR) 30 MG 24 hr tablet Take 1 tablet (30 mg total) by mouth daily. 08/02/19   [name], NP  loratadine (CLARITIN) 10 MG tablet Take 1 tablet (10 mg total) by mouth daily. 04/01/19   Nicholes Mango, MD  metoprolol succinate (TOPROL-XL) 50 MG 24 hr tablet Take 1 tablet (50 mg total) by mouth at bedtime. Take with or immediately following a meal. 07/10/19   Lavera Guise, MD  nitroGLYCERIN (NITROSTAT) 0.4  MG SL tablet Place 1 tablet (0.4 mg total) under the tongue every 5 (five) minutes x 3 doses as needed for chest pain. 05/24/19   Loletha Grayer, MD  NOVOFINE 32G X 6 MM MISC To use with [place] injections daily 12/13/18   [name], NP  omeprazole (PRILOSEC) 40 MG capsule 1 cap daily 07/10/19   Lavera Guise, MD  oxyCODONE-acetaminophen (PERCOCET) 5-325 MG tablet Take 1 tablet by mouth every 4 (four) hours as needed for severe pain. 06/18/19 06/17/20  Lavina Hamman, MD  polyethylene glycol (MIRALAX / GLYCOLAX) 17 g packet Take 17 g by mouth daily. 06/18/19   Lavina Hamman, MD  potassium chloride SA (KLOR-CON) 20 MEQ tablet Take 1 tablet (20 mEq total) by mouth daily. 05/24/19   Loletha Grayer, MD  sodium chloride (OCEAN) 0.65 % SOLN nasal spray Place 1 spray into both nostrils as needed for congestion. 06/18/19   Lavina Hamman,  MD  tiotropium (SPIRIVA HANDIHALER) 18 MCG inhalation capsule Place 1 capsule (18 mcg total) into inhaler and inhale daily. 08/02/19 09/01/19  Lavera Guise, MD  TRUEplus Lancets 33G MISC Use as directed twice daily diag E11.65 07/30/19   [name], NP    Allergies Penicillins  Family Hx Family History  Problem Relation Age of Onset  . Diabetes Mother   . Lung cancer Father   . Diabetes Sister   . Hypertension Sister   . Heart disease Sister   . Cancer Sister   . Bone cancer Brother     Social Hx Social History   Tobacco Use  . Smoking status: Former Smoker    Types: Cigarettes  . Smokeless tobacco: Former Systems developer    Types: Chew  Substance Use Topics  . Alcohol use: No  . Drug use: No     Review of Systems  Constitutional: Negative for fever, chills. Eyes: Negative for visual changes. ENT: Negative for sore throat. Cardiovascular: Negative for chest pain. Respiratory: Negative for shortness of breath. Gastrointestinal: Negative for nausea, vomiting.  Genitourinary: Negative for dysuria. Musculoskeletal: Positive for bilateral leg pain. Skin: Negative for rash. Neurological: Negative for headaches.   Physical Exam  Vital Signs: ED Triage Vitals [08/04/19 1955]  Enc Vitals Group     BP (!) 180/86     Pulse Rate 94     Resp 18     Temp 98 F (36.7 C)     Temp src      SpO2 97 %     Weight 240 lb (108.9 kg)     Height _0  (1.803 m)     Head Circumference      Peak Flow      Pain Score 10     Pain Loc      Pain Edu?      Excl. in [place]?     Constitutional: Alert and oriented.  Head: Normocephalic. Atraumatic. Eyes: Conjunctivae clear. Sclera anicteric. Nose: No congestion. No rhinorrhea. Mouth/Throat: Wearing mask.  Neck: No stridor.   Cardiovascular: Normal rate, regular rhythm. Extremities well perfused.  2+ symmetric DP pulses bilaterally.  Toes warm and well-perfused. Respiratory: Normal respiratory effort.  Lungs CTAB. Gastrointestinal:  Soft. Non-tender. Non-distended.  Musculoskeletal: No deformities. FROM bilateral hips, knees, ankles.  No asymmetry or edema.  No erythema, warmth, induration, or crepitance.  Complains of pain with generalized palpation of the lower bilateral lower extremities. Neurologic:  Normal speech and language. On isolation of BLE, demonstrates equal and symmetric strength in  tact (4+/5) with bilateral hip flexion/extension, knee flexion/extension, dorsiflexion/plantar flexion.  Sensation intact to light touch, but does complain of pain with generalized palpation of the lower extremities. Skin: Skin is warm, dry and intact. No rash noted. Psychiatric: Mood and affect are appropriate for situation.  EKG  Personally reviewed.   Rate: 95 Rhythm: sinus Axis: normal Intervals: borderline PR Significant artifact and baseline wander No STEMI    Radiology  CXR: IMPRESSION:  No active cardiopulmonary disease.    Procedures  Procedure(s) performed (including critical care):  Procedures   Initial Impression / Assessment and Plan / ED Course  64 y.o. male who presents to the ED for bilateral lower extremity pain, as above.  Ddx: diabetic neuropathy, statin myopathy, deconditioning, MSK pain.  He has equal and symmetric distal pulses by palpation bilaterally, toes are warm and well perfused, do not suspect an acute arterial/vascular etiology or compartment syndrome.  He has no asymmetric edema, no recent immobilization, do not suspect DVT.  No evidence of cellulitis or crepitance on exam to suggest infection. Does not appear frankly volume overloaded.   Labs initiated in triage near baseline without actionable derangements.  CK within normal limits.  We will plan for pain control and trial of ambulation.   Final Clinical Impression(s) / ED Diagnosis  Final diagnoses:  Pain in both lower extremities  Neuropathy       Note:  This document was prepared using Dragon voice recognition  software and may include unintentional dictation errors.   Lilia Pro., MD 08/05/19 [phone]

## 2019-08-05 MED ORDER — IPRATROPIUM-ALBUTEROL 0.5-2.5 (3) MG/3ML IN SOLN
3.0000 mL | Freq: Once | RESPIRATORY_TRACT | Status: AC
  Administered 2019-08-05: 3 mL via RESPIRATORY_TRACT
  Filled 2019-08-05: qty 3

## 2019-08-05 NOTE — ED Notes (Signed)
This RN attempted to ambulate pt. Pt was able to stand up on side of bed briefly for about 30 seconds; however, kept falling onto bed and losing his balance. States that he feels weak and is experiencing pain in both legs.

## 2019-08-05 NOTE — Telephone Encounter (Signed)
Attempted to schedule.  LMOV to call office.  ° °

## 2019-08-07 ENCOUNTER — Other Ambulatory Visit: Payer: Self-pay

## 2019-08-07 MED ORDER — TRAMADOL HCL 50 MG PO TABS: 50.0000 mg | ORAL_TABLET | Freq: Two times a day (BID) | ORAL | 0 refills | Status: DC | PRN

## 2019-08-07 MED ORDER — SPIRIVA HANDIHALER 18 MCG IN CAPS: 18.0000 ug | ORAL_CAPSULE | Freq: Every day | RESPIRATORY_TRACT | 0 refills | Status: DC

## 2019-08-07 NOTE — Telephone Encounter (Signed)
Pt called that having severe pain in his leg he was throwing up in the morning and his glucose was low in morning 78 and now is going up to 207 and he is feeling better with stomach as per dr Humphrey Rolls called in tramadol 50 ng bid #30 and also advised that if don't have appetite he need reduced insulin from 26 units to 12 units and also advised to eat bland diet and also he need appt to been seen in 2 weeks

## 2019-08-08 ENCOUNTER — Telehealth: Payer: Self-pay

## 2019-08-08 ENCOUNTER — Other Ambulatory Visit: Payer: Self-pay

## 2019-08-08 ENCOUNTER — Observation Stay: Payer: Medicare PPO

## 2019-08-08 ENCOUNTER — Emergency Department: Payer: Medicare PPO

## 2019-08-08 ENCOUNTER — Observation Stay
Admission: EM | Admit: 2019-08-08 | Discharge: 2019-08-09 | Disposition: A | Discharge status: Home / Self Care | Payer: Medicare PPO | Attending: Family Medicine | Admitting: Family Medicine

## 2019-08-08 ENCOUNTER — Ambulatory Visit: Payer: Self-pay | Admitting: General Surgery

## 2019-08-08 ENCOUNTER — Encounter: Payer: Self-pay | Admitting: Intensive Care

## 2019-08-08 DIAGNOSIS — I130 Hypertensive heart and chronic kidney disease with heart failure and stage 1 through stage 4 chronic kidney disease, or unspecified chronic kidney disease: Secondary | ICD-10-CM | POA: Diagnosis not present

## 2019-08-08 DIAGNOSIS — K219 Gastro-esophageal reflux disease without esophagitis: Secondary | ICD-10-CM | POA: Insufficient documentation

## 2019-08-08 DIAGNOSIS — N179 Acute kidney failure, unspecified: Secondary | ICD-10-CM | POA: Insufficient documentation

## 2019-08-08 DIAGNOSIS — I2510 Atherosclerotic heart disease of native coronary artery without angina pectoris: Secondary | ICD-10-CM | POA: Insufficient documentation

## 2019-08-08 DIAGNOSIS — Z905 Acquired absence of kidney: Secondary | ICD-10-CM | POA: Insufficient documentation

## 2019-08-08 DIAGNOSIS — Z6833 Body mass index (BMI) 33.0-33.9, adult: Secondary | ICD-10-CM | POA: Insufficient documentation

## 2019-08-08 DIAGNOSIS — Z6835 Body mass index (BMI) 35.0-35.9, adult: Secondary | ICD-10-CM

## 2019-08-08 DIAGNOSIS — Z85528 Personal history of other malignant neoplasm of kidney: Secondary | ICD-10-CM | POA: Insufficient documentation

## 2019-08-08 DIAGNOSIS — Z794 Long term (current) use of insulin: Secondary | ICD-10-CM | POA: Insufficient documentation

## 2019-08-08 DIAGNOSIS — E669 Obesity, unspecified: Secondary | ICD-10-CM

## 2019-08-08 DIAGNOSIS — G4733 Obstructive sleep apnea (adult) (pediatric): Secondary | ICD-10-CM | POA: Diagnosis not present

## 2019-08-08 DIAGNOSIS — N189 Chronic kidney disease, unspecified: Secondary | ICD-10-CM | POA: Diagnosis not present

## 2019-08-08 DIAGNOSIS — Z79899 Other long term (current) drug therapy: Secondary | ICD-10-CM | POA: Insufficient documentation

## 2019-08-08 DIAGNOSIS — E1122 Type 2 diabetes mellitus with diabetic chronic kidney disease: Secondary | ICD-10-CM | POA: Insufficient documentation

## 2019-08-08 DIAGNOSIS — J449 Chronic obstructive pulmonary disease, unspecified: Secondary | ICD-10-CM | POA: Diagnosis not present

## 2019-08-08 DIAGNOSIS — D509 Iron deficiency anemia, unspecified: Secondary | ICD-10-CM | POA: Diagnosis not present

## 2019-08-08 DIAGNOSIS — Z20822 Contact with and (suspected) exposure to covid-19: Secondary | ICD-10-CM | POA: Insufficient documentation

## 2019-08-08 DIAGNOSIS — E1142 Type 2 diabetes mellitus with diabetic polyneuropathy: Secondary | ICD-10-CM | POA: Diagnosis not present

## 2019-08-08 DIAGNOSIS — E876 Hypokalemia: Secondary | ICD-10-CM | POA: Diagnosis not present

## 2019-08-08 DIAGNOSIS — E785 Hyperlipidemia, unspecified: Secondary | ICD-10-CM | POA: Insufficient documentation

## 2019-08-08 DIAGNOSIS — Z87891 Personal history of nicotine dependence: Secondary | ICD-10-CM | POA: Diagnosis not present

## 2019-08-08 DIAGNOSIS — M19012 Primary osteoarthritis, left shoulder: Secondary | ICD-10-CM | POA: Diagnosis not present

## 2019-08-08 DIAGNOSIS — I5032 Chronic diastolic (congestive) heart failure: Secondary | ICD-10-CM | POA: Insufficient documentation

## 2019-08-08 DIAGNOSIS — I5042 Chronic combined systolic (congestive) and diastolic (congestive) heart failure: Secondary | ICD-10-CM | POA: Diagnosis not present

## 2019-08-08 DIAGNOSIS — M7989 Other specified soft tissue disorders: Secondary | ICD-10-CM | POA: Diagnosis not present

## 2019-08-08 DIAGNOSIS — E1165 Type 2 diabetes mellitus with hyperglycemia: Secondary | ICD-10-CM | POA: Diagnosis not present

## 2019-08-08 DIAGNOSIS — R0602 Shortness of breath: Secondary | ICD-10-CM | POA: Diagnosis not present

## 2019-08-08 DIAGNOSIS — N184 Chronic kidney disease, stage 4 (severe): Secondary | ICD-10-CM | POA: Diagnosis not present

## 2019-08-08 DIAGNOSIS — R079 Chest pain, unspecified: Secondary | ICD-10-CM

## 2019-08-08 DIAGNOSIS — R531 Weakness: Secondary | ICD-10-CM

## 2019-08-08 DIAGNOSIS — M79602 Pain in left arm: Principal | ICD-10-CM | POA: Insufficient documentation

## 2019-08-08 DIAGNOSIS — M79632 Pain in left forearm: Secondary | ICD-10-CM | POA: Diagnosis not present

## 2019-08-08 DIAGNOSIS — D631 Anemia in chronic kidney disease: Secondary | ICD-10-CM | POA: Diagnosis not present

## 2019-08-08 DIAGNOSIS — N1832 Chronic kidney disease, stage 3b: Secondary | ICD-10-CM | POA: Diagnosis present

## 2019-08-08 DIAGNOSIS — R52 Pain, unspecified: Secondary | ICD-10-CM

## 2019-08-08 DIAGNOSIS — R609 Edema, unspecified: Secondary | ICD-10-CM | POA: Diagnosis not present

## 2019-08-08 DIAGNOSIS — I509 Heart failure, unspecified: Secondary | ICD-10-CM

## 2019-08-08 DIAGNOSIS — I5022 Chronic systolic (congestive) heart failure: Secondary | ICD-10-CM

## 2019-08-08 LAB — CBC
HCT: 30.4 % — ABNORMAL LOW (ref 39.0–52.0)
Hemoglobin: 10.2 g/dL — ABNORMAL LOW (ref 13.0–17.0)
MCH: 28.8 pg (ref 26.0–34.0)
MCHC: 33.6 g/dL (ref 30.0–36.0)
MCV: 85.9 fL (ref 80.0–100.0)
Platelets: 319 10*3/uL (ref 150–400)
RBC: 3.54 MIL/uL — ABNORMAL LOW (ref 4.22–5.81)
RDW: 13.2 % (ref 11.5–15.5)
WBC: 11.7 10*3/uL — ABNORMAL HIGH (ref 4.0–10.5)
nRBC: 0 % (ref 0.0–0.2)

## 2019-08-08 LAB — BASIC METABOLIC PANEL
Anion gap: 13 (ref 5–15)
BUN: 65 mg/dL — ABNORMAL HIGH (ref 8–23)
CO2: 20 mmol/L — ABNORMAL LOW (ref 22–32)
Calcium: 8.7 mg/dL — ABNORMAL LOW (ref 8.9–10.3)
Chloride: 103 mmol/L (ref 98–111)
Creatinine, Ser: 2.34 mg/dL — ABNORMAL HIGH (ref 0.61–1.24)
GFR calc Af Amer: 32 mL/min — ABNORMAL LOW (ref >60)
GFR calc non Af Amer: 28 mL/min — ABNORMAL LOW (ref >60)
Glucose, Bld: 234 mg/dL — ABNORMAL HIGH (ref 70–99)
Potassium: 2.7 mmol/L — CL (ref 3.5–5.1)
Sodium: 136 mmol/L (ref 135–145)

## 2019-08-08 LAB — URINALYSIS, COMPLETE (UACMP) WITH MICROSCOPIC
Bacteria, UA: NONE SEEN
Bilirubin Urine: NEGATIVE
Glucose, UA: 50 mg/dL — AB
Hgb urine dipstick: NEGATIVE
Ketones, ur: NEGATIVE mg/dL
Leukocytes,Ua: NEGATIVE
Nitrite: NEGATIVE
Protein, ur: 100 mg/dL — AB
Specific Gravity, Urine: 1.012 (ref 1.005–1.030)
Squamous Epithelial / HPF: NONE SEEN (ref 0–5)
pH: 5 (ref 5.0–8.0)

## 2019-08-08 LAB — URINE DRUG SCREEN, QUALITATIVE (ARMC ONLY)
Amphetamines, Ur Screen: NOT DETECTED
Barbiturates, Ur Screen: NOT DETECTED
Benzodiazepine, Ur Scrn: NOT DETECTED
Cannabinoid 50 Ng, Ur ~~LOC~~: NOT DETECTED
Cocaine Metabolite,Ur ~~LOC~~: NOT DETECTED
MDMA (Ecstasy)Ur Screen: NOT DETECTED
Methadone Scn, Ur: NOT DETECTED
Opiate, Ur Screen: NOT DETECTED
Phencyclidine (PCP) Ur S: NOT DETECTED
Tricyclic, Ur Screen: NOT DETECTED

## 2019-08-08 LAB — HEPATIC FUNCTION PANEL
ALT: 17 U/L (ref 0–44)
AST: 25 U/L (ref 15–41)
Albumin: 2.8 g/dL — ABNORMAL LOW (ref 3.5–5.0)
Alkaline Phosphatase: 72 U/L (ref 38–126)
Bilirubin, Direct: 0.1 mg/dL (ref 0.0–0.2)
Indirect Bilirubin: 0.4 mg/dL (ref 0.3–0.9)
Total Bilirubin: 0.5 mg/dL (ref 0.3–1.2)
Total Protein: 7.1 g/dL (ref 6.5–8.1)

## 2019-08-08 LAB — MAGNESIUM: Magnesium: 2.2 mg/dL (ref 1.7–2.4)

## 2019-08-08 LAB — TROPONIN I (HIGH SENSITIVITY)
Troponin I (High Sensitivity): 24 ng/L — ABNORMAL HIGH (ref <18)
Troponin I (High Sensitivity): 25 ng/L — ABNORMAL HIGH (ref <18)

## 2019-08-08 LAB — LIPASE, BLOOD: Lipase: 28 U/L (ref 11–51)

## 2019-08-08 LAB — URIC ACID: Uric Acid, Serum: 9.1 mg/dL — ABNORMAL HIGH (ref 3.7–8.6)

## 2019-08-08 LAB — POC SARS CORONAVIRUS 2 AG: SARS Coronavirus 2 Ag: NEGATIVE

## 2019-08-08 MED ORDER — ACETAMINOPHEN 325 MG PO TABS: 650.0000 mg | ORAL_TABLET | Freq: Four times a day (QID) | ORAL | Status: DC | PRN

## 2019-08-08 MED ORDER — POTASSIUM CHLORIDE CRYS ER 20 MEQ PO TBCR: 20.0000 meq | EXTENDED_RELEASE_TABLET | Freq: Every day | ORAL | Status: DC

## 2019-08-08 MED ORDER — SODIUM CHLORIDE 0.9 % IV BOLUS
500.0000 mL | Freq: Once | INTRAVENOUS | Status: AC
  Administered 2019-08-08: 500 mL via INTRAVENOUS

## 2019-08-08 MED ORDER — ENOXAPARIN SODIUM 40 MG/0.4ML ~~LOC~~ SOLN
40.0000 mg | SUBCUTANEOUS | Status: DC
  Administered 2019-08-09: 40 mg via SUBCUTANEOUS
  Filled 2019-08-08: qty 0.4

## 2019-08-08 MED ORDER — GEMFIBROZIL 600 MG PO TABS
600.0000 mg | ORAL_TABLET | Freq: Every day | ORAL | Status: DC
  Administered 2019-08-09: 600 mg via ORAL
  Filled 2019-08-08: qty 1

## 2019-08-08 MED ORDER — PANTOPRAZOLE SODIUM 40 MG PO TBEC
40.0000 mg | DELAYED_RELEASE_TABLET | Freq: Every day | ORAL | Status: DC
  Administered 2019-08-09: 09:00:00 40 mg via ORAL
  Filled 2019-08-08: qty 1

## 2019-08-08 MED ORDER — POTASSIUM CHLORIDE 10 MEQ/100ML IV SOLN
10.0000 meq | Freq: Once | INTRAVENOUS | Status: AC
  Administered 2019-08-08: 10 meq via INTRAVENOUS
  Filled 2019-08-08: qty 100

## 2019-08-08 MED ORDER — FERROUS GLUCONATE 324 (38 FE) MG PO TABS
324.0000 mg | ORAL_TABLET | Freq: Every day | ORAL | Status: DC
  Administered 2019-08-09: 08:00:00 324 mg via ORAL
  Filled 2019-08-08: qty 1

## 2019-08-08 MED ORDER — LORAZEPAM 0.5 MG PO TABS
0.5000 mg | ORAL_TABLET | Freq: Once | ORAL | Status: AC
  Administered 2019-08-08: 0.5 mg via ORAL
  Filled 2019-08-08: qty 1

## 2019-08-08 MED ORDER — METOPROLOL SUCCINATE ER 50 MG PO TB24
50.0000 mg | ORAL_TABLET | Freq: Every day | ORAL | Status: DC
  Administered 2019-08-09: 50 mg via ORAL
  Filled 2019-08-08: qty 1

## 2019-08-08 MED ORDER — FUROSEMIDE 40 MG PO TABS: 80.0000 mg | ORAL_TABLET | Freq: Two times a day (BID) | ORAL | Status: DC

## 2019-08-08 MED ORDER — POTASSIUM CHLORIDE CRYS ER 20 MEQ PO TBCR
40.0000 meq | EXTENDED_RELEASE_TABLET | Freq: Once | ORAL | Status: AC
  Administered 2019-08-08: 40 meq via ORAL
  Filled 2019-08-08: qty 2

## 2019-08-08 MED ORDER — OXYCODONE-ACETAMINOPHEN 5-325 MG PO TABS: 1.0000 | ORAL_TABLET | ORAL | Status: DC | PRN

## 2019-08-08 MED ORDER — GEMFIBROZIL 600 MG PO TABS
300.0000 mg | ORAL_TABLET | Freq: Every day | ORAL | Status: DC
  Filled 2019-08-08: qty 0.5

## 2019-08-08 MED ORDER — TIOTROPIUM BROMIDE MONOHYDRATE 18 MCG IN CAPS
18.0000 ug | ORAL_CAPSULE | Freq: Every day | RESPIRATORY_TRACT | Status: DC
  Filled 2019-08-08 (×2): qty 5

## 2019-08-08 MED ORDER — ISOSORBIDE MONONITRATE ER 30 MG PO TB24
30.0000 mg | ORAL_TABLET | Freq: Every day | ORAL | Status: DC
  Administered 2019-08-09: 30 mg via ORAL
  Filled 2019-08-08: qty 1

## 2019-08-08 MED ORDER — INSULIN GLARGINE 100 UNIT/ML ~~LOC~~ SOLN
20.0000 [IU] | Freq: Every day | SUBCUTANEOUS | Status: DC
  Filled 2019-08-08 (×2): qty 0.2

## 2019-08-08 MED ORDER — INSULIN ASPART 100 UNIT/ML ~~LOC~~ SOLN
0.0000 [IU] | Freq: Three times a day (TID) | SUBCUTANEOUS | Status: DC
  Administered 2019-08-09: 2 [IU] via SUBCUTANEOUS
  Filled 2019-08-08: qty 1

## 2019-08-08 MED ORDER — HYDRALAZINE HCL 50 MG PO TABS
100.0000 mg | ORAL_TABLET | Freq: Three times a day (TID) | ORAL | Status: DC
  Administered 2019-08-09 (×2): 100 mg via ORAL
  Filled 2019-08-08 (×2): qty 2

## 2019-08-08 MED ORDER — AMLODIPINE BESYLATE 10 MG PO TABS
10.0000 mg | ORAL_TABLET | Freq: Every day | ORAL | Status: DC
  Administered 2019-08-09: 10 mg via ORAL
  Filled 2019-08-08: qty 1

## 2019-08-08 MED ORDER — IPRATROPIUM-ALBUTEROL 0.5-2.5 (3) MG/3ML IN SOLN: 3.0000 mL | Freq: Four times a day (QID) | RESPIRATORY_TRACT | Status: DC | PRN

## 2019-08-08 MED ORDER — GEMFIBROZIL 600 MG PO TABS: 600.0000 mg | ORAL_TABLET | Freq: Two times a day (BID) | ORAL | Status: DC

## 2019-08-08 NOTE — ED Provider Notes (Signed)
[facility] Emergency Department Provider Note    First MD Initiated Contact with Patient 08/08/19 1638     (approximate)  I have reviewed the triage vital signs and the nursing notes.   HISTORY  Chief Complaint Chest Pain    HPI Larry Callahan is a 64 y.o. male with the below listed past medical history 1 in his facility presents to the ER for multiple complaints but primarily seems to be pain in his left wrist and arm.  On review of record patient appears to have multiple presentations for a myriad of different complaints was just recently seen for leg pain and weakness was sent home on tramadol.  He is denying any new chest pain.  No interval falls or trauma.  His primary concern is that he had a reaction to the tramadol stating that it makes him confused.  States that he is having weakness in the left arm but is moving it on exam.  Patient very tearful and anxious appearing.  Somewhat difficult to follow his train of thought.  States that he has some shortness of breath but he is not having any signs of hypoxia or tachypnea on exam he is speaking in complete sentences.   Also complaining of a umbilical hernia that he has had for a long time.  Has not followed up with general surgery for this.  He states he is not having any vomiting.  No nausea.  Denies any headache, no neck pain or discomfort radiating through to the back..  States his been compliant with his medications.   Past Medical History:  Diagnosis Date  . Allergy   . Coronary artery disease   . Diabetes mellitus without complication ([place])   . Diastolic CHF ([place])   . GERD (gastroesophageal reflux disease)   . Hyperlipidemia   . Hypertension   . Renal insufficiency   . Sleep apnea    Family History  Problem Relation Age of Onset  . Diabetes Mother   . Lung cancer Father   . Diabetes Sister   . Hypertension Sister   . Heart disease Sister   . Cancer Sister   . Bone cancer Brother      Past Surgical History:  Procedure Laterality Date  . BACK SURGERY    . CHOLECYSTECTOMY    . COLONOSCOPY WITH PROPOFOL N/A 04/12/2019   Procedure: COLONOSCOPY WITH PROPOFOL;  Surgeon: [name], MD;  Location: [facility] ENDOSCOPY;  Service: Gastroenterology;  Laterality: N/A;  . ESOPHAGOGASTRODUODENOSCOPY N/A 04/12/2019   Procedure: ESOPHAGOGASTRODUODENOSCOPY (EGD);  Surgeon: [name], MD;  Location[facility] ENDOSCOPY;  Service: Gastroenterology;  Laterality: N/A;  . FLEXIBLE BRONCHOSCOPY Bilateral 05/17/2019   Procedure: FLEXIBLE BRONCHOSCOPY;  Surgeon: [name], MD;  Location: [name] ORS;  Service: Pulmonary;  Laterality: Bilateral;  . left arm surgery    . nephrectomy Left   . PARATHYROIDECTOMY    . RIGHT HEART CATH N/A 03/29/2019   Procedure: RIGHT HEART CATH;  Surgeon: [name], MD;  Location: [facility] CV LAB;  Service: Cardiovascular;  Laterality: N/A;   Patient Active Problem List   Diagnosis Date Noted  . Acute right hip pain 06/17/2019  . Inability to ambulate due to hip 06/17/2019  . CKD stage 3 due to type 2 diabetes mellitus ([place])   . Hypervolemia   . Acute on chronic diastolic CHF (congestive heart failure) ([place])   . Lobar pneumonia ([place])   . Type 2 diabetes mellitus with stage 3b  chronic kidney disease, with long-term current use of insulin ([place])   . Hypokalemia   . Hypomagnesemia   . Full code status 05/20/2019  . Pleuritic chest pain   . CAP (community acquired pneumonia) 05/19/2019  . AKI (acute kidney injury) ([place]) 05/09/2019  . Hemoptysis 04/09/2019  . Shortness of breath 03/26/2019  . Uncontrolled type 2 diabetes mellitus with insulin therapy ([place]) 03/12/2019  . Acute on chronic heart failure with preserved ejection fraction (HFpEF) ([place])   . Acute kidney injury superimposed on chronic kidney disease ([place])   . CHF (congestive heart failure) ([place]) 03/01/2019  . Diastolic dysfunction 28/31/5176  . Iron deficiency anemia  12/31/2018  . Atopic dermatitis 11/24/2018  . Screening for colon cancer 11/06/2017  . Uncontrolled type 2 diabetes mellitus with hyperglycemia ([place]) 11/06/2017  . Dermatitis, unspecified 06/09/2017  . Atherosclerotic heart disease of native coronary artery without angina pectoris 06/09/2017  . 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 Obstructive sleep apnea, adult 06/09/2017  . Morbid (severe) obesity due to excess calories ([place]) 06/09/2017  . Cigarette nicotine dependence with nicotine-induced disorder 06/09/2017  . Type 2 diabetes mellitus with diabetic polyneuropathy ([place]) 06/09/2017  . Allergic rhinitis due to pollen 06/09/2017  . Mixed hyperlipidemia 06/09/2017  . COPD with acute exacerbation ([place]) 06/09/2017  . Dyspnea on exertion 06/09/2017  . Wheezing 06/09/2017  . Dysuria 06/09/2017  . Snoring 06/09/2017  . Essential hypertension 06/09/2017  . Personal history of other malignant neoplasm of kidney 06/09/2017  . Pain in right hip 06/09/2017  . Impacted cerumen, bilateral 06/09/2017  . Secondary hyperparathyroidism, not elsewhere classified ([place]) 06/09/2017  . Type 2 diabetes mellitus with hyperglycemia ([place]) 06/09/2017  . Tinea corporis 06/09/2017      Prior to Admission medications   Medication Sig Start Date End Date Taking? Authorizing Provider  acetaminophen (TYLENOL) 325 MG tablet Take 2 tablets (650 mg total) by mouth every 4 (four) hours as needed for headache or mild pain. 03/05/19   Gouru, Illene Silver, MD  albuterol (VENTOLIN HFA) 108 (90 Base) MCG/ACT inhaler Inhale 2 puffs into the lungs every 6 (six) hours as needed for wheezing or shortness of breath. 03/05/19   Gouru, Illene Silver, MD  amLODipine (NORVASC) 10 MG tablet Take 1 tablet (10 mg total) by mouth daily. 08/01/19   [name], NP  Blood Glucose Calibration (TRUE METRIX LEVEL 1) Low SOLN Use as directed 04/04/18   [name], NP  Blood Glucose Monitoring Suppl (TRUE  METRIX AIR GLUCOSE METER) DEVI 1 Device by Does not apply route 2 (two) times daily. 04/13/18   [name], NP  ferrous gluconate (FERGON) 324 MG tablet Take 1 tablet (324 mg total) by mouth daily with breakfast. 08/01/19   [name], NP  furosemide (LASIX) 80 MG tablet Take 1 tablet (80 mg total) by mouth 2 (two) times daily. 08/01/19   [name], NP  gemfibrozil (LOPID) 600 MG tablet Take 1 tablet (600 mg total) by mouth 2 (two) times daily with a meal. 06/27/19   Boscia, Greer Ee, NP  glimepiride (AMARYL) 2 MG tablet Take 1 tablet (2 mg total) by mouth at bedtime. 08/02/19   Lavera Guise, MD  glucose blood (TRUE METRIX BLOOD GLUCOSE TEST) test strip Use as instructed twice daily diag E11.65 07/30/19   [name], NP  guaiFENesin (MUCINEX) 600 MG 12 hr tablet Take 1 tablet (600 mg total) by mouth 2 (two) times daily. 06/18/19  Lavina Hamman, MD  hydrALAZINE (APRESOLINE) 100 MG tablet Take 1 tablet (100 mg total) by mouth 3 (three) times daily. 08/02/19   Lavera Guise, MD  Insulin Glargine, 1 Unit Dial, (TOUJEO SOLOSTAR) 300 UNIT/ML SOPN Inject 26 Units into the skin every evening. 03/13/19 06/17/19  [name], NP  ipratropium-albuterol (DUONEB) 0.5-2.5 (3) MG/3ML SOLN Take 3 mLs by nebulization every 6 (six) hours as needed. 03/08/19   Kendell Bane, NP  isosorbide mononitrate (IMDUR) 30 MG 24 hr tablet Take 1 tablet (30 mg total) by mouth daily. 08/02/19   [name], NP  loratadine (CLARITIN) 10 MG tablet Take 1 tablet (10 mg total) by mouth daily. 04/01/19   Nicholes Mango, MD  metoprolol succinate (TOPROL-XL) 50 MG 24 hr tablet Take 1 tablet (50 mg total) by mouth at bedtime. Take with or immediately following a meal. 07/10/19   Lavera Guise, MD  nitroGLYCERIN (NITROSTAT) 0.4 MG SL tablet Place 1 tablet (0.4 mg total) under the tongue every 5 (five) minutes x 3 doses as needed for chest pain. 05/24/19   Loletha Grayer, MD  NOVOFINE 32G X 6 MM MISC To use  with [place] injections daily 12/13/18   [name], NP  omeprazole (PRILOSEC) 40 MG capsule 1 cap daily 07/10/19   Lavera Guise, MD  oxyCODONE-acetaminophen (PERCOCET) 5-325 MG tablet Take 1 tablet by mouth every 4 (four) hours as needed for severe pain. 06/18/19 06/17/20  Lavina Hamman, MD  polyethylene glycol (MIRALAX / GLYCOLAX) 17 g packet Take 17 g by mouth daily. 06/18/19   Lavina Hamman, MD  potassium chloride SA (KLOR-CON) 20 MEQ tablet Take 1 tablet (20 mEq total) by mouth daily. 05/24/19   Loletha Grayer, MD  sodium chloride (OCEAN) 0.65 % SOLN nasal spray Place 1 spray into both nostrils as needed for congestion. 06/18/19   Lavina Hamman, MD  tiotropium (SPIRIVA HANDIHALER) 18 MCG inhalation capsule Place 1 capsule (18 mcg total) into inhaler and inhale daily. 08/07/19 11/05/19  [name], NP  traMADol (ULTRAM) 50 MG tablet Take 1 tablet (50 mg total) by mouth every 12 (twelve) hours as needed for severe pain. 08/07/19 09/06/19  Lavera Guise, MD  TRUEplus Lancets 33G MISC Use as directed twice daily diag E11.65 07/30/19   [name], NP    Allergies Penicillins    Social History Social History   Tobacco Use  . Smoking status: Former Smoker    Types: Cigarettes  . Smokeless tobacco: Former Systems developer    Types: Chew  Substance Use Topics  . Alcohol use: No  . Drug use: No    Review of Systems Patient denies headaches, rhinorrhea, blurry vision, numbness, shortness of breath, chest pain, edema, cough, abdominal pain, nausea, vomiting, diarrhea, dysuria, fevers, rashes or hallucinations unless otherwise stated above in HPI. ____________________________________________   PHYSICAL EXAM:  VITAL SIGNS: Vitals:   08/08/19 1830 08/08/19 1930  BP: (!) 166/97 (!) 154/77  Pulse: 86 95  Resp:  17  Temp:    SpO2: 97% 98%    Constitutional: Alert and oriented. Anxious and tearful Eyes: Conjunctivae are normal.  Head: Atraumatic. Nose: No  congestion/rhinnorhea. Mouth/Throat: Mucous membranes are moist.   Neck: No stridor. Painless ROM.  Cardiovascular: Normal rate, regular rhythm. Grossly normal heart sounds.  Good peripheral circulation in all four extremities Respiratory: Normal respiratory effort.  No retractions. Lungs with coarse wheeze throughout. Gastrointestinal: Soft and nontender. No distention. No abdominal bruits.  No CVA tenderness. Genitourinary:  Musculoskeletal: mild swelling to LUE,   No overlying erythema, warmth or crepitus,  No effusions noted.  No lower extremity tenderness nor edema.  No joint effusions. Neurologic:  Normal speech and language. No gross focal neurologic deficits are appreciated. No facial droop Skin:  Skin is warm, dry and intact. No rash noted. Psychiatric: Mood and affect are anxious  ____________________________________________   LABS (all labs ordered are listed, but only abnormal results are displayed)  Results for orders placed or performed during the hospital encounter of 08/08/19 (from the past 24 hour(s))  Basic metabolic panel     Status: Abnormal   Collection Time: 08/08/19  2:09 PM  Result Value Ref Range   Sodium 136 135 - 145 mmol/L   Potassium 2.7 (LL) 3.5 - 5.1 mmol/L   Chloride 103 98 - 111 mmol/L   CO2 20 (L) 22 - 32 mmol/L   Glucose, Bld 234 (H) 70 - 99 mg/dL   BUN 65 (H) 8 - 23 mg/dL   Creatinine, Ser 2.34 (H) 0.61 - 1.24 mg/dL   Calcium 8.7 (L) 8.9 - 10.3 mg/dL   GFR calc non Af Amer 28 (L) >60 mL/min   GFR calc Af Amer 32 (L) >60 mL/min   Anion gap 13 5 - 15  CBC     Status: Abnormal   Collection Time: 08/08/19  2:09 PM  Result Value Ref Range   WBC 11.7 (H) 4.0 - 10.5 K/uL   RBC 3.54 (L) 4.22 - 5.81 MIL/uL   Hemoglobin 10.2 (L) 13.0 - 17.0 g/dL   HCT 30.4 (L) 39.0 - 52.0 %   MCV 85.9 80.0 - 100.0 fL   MCH 28.8 26.0 - 34.0 pg   MCHC 33.6 30.0 - 36.0 g/dL   RDW 13.2 11.5 - 15.5 %   Platelets 319 150 - 400 K/uL   nRBC 0.0 0.0 - 0.2 %  Troponin I  (High Sensitivity)     Status: Abnormal   Collection Time: 08/08/19  2:09 PM  Result Value Ref Range   Troponin I (High Sensitivity) 24 (H) <18 ng/L  Magnesium     Status: None   Collection Time: 08/08/19  2:09 PM  Result Value Ref Range   Magnesium 2.2 1.7 - 2.4 mg/dL  Hepatic function panel     Status: Abnormal   Collection Time: 08/08/19  2:16 PM  Result Value Ref Range   Total Protein 7.1 6.5 - 8.1 g/dL   Albumin 2.8 (L) 3.5 - 5.0 g/dL   AST 25 15 - 41 U/L   ALT 17 0 - 44 U/L   Alkaline Phosphatase 72 38 - 126 U/L   Total Bilirubin 0.5 0.3 - 1.2 mg/dL   Bilirubin, Direct 0.1 0.0 - 0.2 mg/dL   Indirect Bilirubin 0.4 0.3 - 0.9 mg/dL  Lipase, blood     Status: None   Collection Time: 08/08/19  2:16 PM  Result Value Ref Range   Lipase 28 11 - 51 U/L  Troponin I (High Sensitivity)     Status: Abnormal   Collection Time: 08/08/19  4:57 PM  Result Value Ref Range   Troponin I (High Sensitivity) 25 (H) <18 ng/L  Urine Drug Screen, Qualitative ([name] only)     Status: None   Collection Time: 08/08/19  4:57 PM  Result Value Ref Range   Tricyclic, Ur Screen NONE DETECTED NONE DETECTED   Amphetamines, Ur Screen NONE DETECTED NONE DETECTED   MDMA (Ecstasy)Ur Screen NONE  DETECTED NONE DETECTED   Cocaine Metabolite,Ur [place] NONE DETECTED NONE DETECTED   Opiate, Ur Screen NONE DETECTED NONE DETECTED   Phencyclidine (PCP) Ur S NONE DETECTED NONE DETECTED   Cannabinoid 50 Ng, Ur [place] NONE DETECTED NONE DETECTED   Barbiturates, Ur Screen NONE DETECTED NONE DETECTED   Benzodiazepine, Ur Scrn NONE DETECTED NONE DETECTED   Methadone Scn, Ur NONE DETECTED NONE DETECTED  Urinalysis, Complete w Microscopic     Status: Abnormal   Collection Time: 08/08/19  4:57 PM  Result Value Ref Range   Color, Urine YELLOW (A) YELLOW   APPearance HAZY (A) CLEAR   Specific Gravity, Urine 1.012 1.005 - 1.030   pH 5.0 5.0 - 8.0   Glucose, UA 50 (A) NEGATIVE mg/dL   Hgb urine dipstick NEGATIVE NEGATIVE    Bilirubin Urine NEGATIVE NEGATIVE   Ketones, ur NEGATIVE NEGATIVE mg/dL   Protein, ur 100 (A) NEGATIVE mg/dL   Nitrite NEGATIVE NEGATIVE   Leukocytes,Ua NEGATIVE NEGATIVE   RBC / HPF 0-5 0 - 5 RBC/hpf   WBC, UA 0-5 0 - 5 WBC/hpf   Bacteria, UA NONE SEEN NONE SEEN   Squamous Epithelial / LPF NONE SEEN 0 - 5   Mucus PRESENT    Hyaline Casts, UA PRESENT   POC SARS Coronavirus 2 Ag     Status: None   Collection Time: 08/08/19  7:34 PM  Result Value Ref Range   SARS Coronavirus 2 Ag NEGATIVE NEGATIVE   ____________________________________________  EKG My review and personal interpretation at Time: 14:10   Indication: left arm pain  Rate: 90  Rhythm: sinus Axis: normal Other: nonspecific st abn, no stemi ____________________________________________  RADIOLOGY I personally reviewed all radiographic images ordered to evaluate for the above acute complaints and reviewed radiology reports and findings.  These findings were personally discussed with the patient.  Please see medical record for radiology report.  ____________________________________________   PROCEDURES  Procedure(s) performed:  Procedures    Critical Care performed: no ____________________________________________   INITIAL IMPRESSION / ASSESSMENT AND PLAN / ED COURSE  Pertinent labs & imaging results that were available during my care of the patient were reviewed by me and considered in my medical decision making (see chart for details).   DDX: arthritis, contusion, ischemic limb, spasm, acs, dissection, pna, anxiety, panic  Theodus Ran is a 64 y.o. who presents to the ED with symptoms as described above.  Fairly difficult to get a good story as to what brought him to the ER today.  Will order blood work for the but differential.  He is well perfused does appear anxious.  Does have significant cardiac history and his troponin is mildly elevated as compared to previous.  Renal function roughly at  baseline.  Clinical Course as of Aug 07 1946  Thu Aug 08, 2019  1846 Went to recheck patient appears more relaxed now but still complaining multiple complaints now saying that he is generalized weakness having nausea and vomiting which are new complaints.  States he wants to be tested for Covid.   [PR]  1927 Repeat abdominal exam benign.  No evidence of left upper extremity DVT fracture.  Still having intermittent left shoulder and arm discomfort.  Patient does not feel comfortable being discharged home.  Will discuss with hospitalist for observation   [PR]    Clinical Course User Index [PR] Merlyn Lot, MD    The patient was evaluated in Emergency Department today for the symptoms described in the history of  present illness. He/she was evaluated in the context of the global COVID-19 pandemic, which necessitated consideration that the patient might be at risk for infection with the SARS-CoV-2 virus that causes COVID-19. Institutional protocols and algorithms that pertain to the evaluation of patients at risk for COVID-19 are in a state of rapid change based on information released by regulatory bodies including the CDC and federal and state organizations. These policies and algorithms were followed during the patient's care in the ED.  As part of my medical decision making, I reviewed the following data within the [facility] notes reviewed and incorporated, Labs reviewed, notes from prior ED visits and [place] Controlled Substance Database   ____________________________________________   FINAL CLINICAL IMPRESSION(S) / ED DIAGNOSES  Final diagnoses:  Left arm pain  Chest pain, unspecified type  Hypokalemia      NEW MEDICATIONS STARTED DURING THIS VISIT:  New Prescriptions   No medications on file     Note:  This document was prepared using Dragon voice recognition software and may include unintentional dictation errors.    Merlyn Lot,  MD 08/08/19 [phone]

## 2019-08-08 NOTE — ED Notes (Signed)
Patient provided ice and gingerale. Patient using phone to speak to sister

## 2019-08-08 NOTE — ED Triage Notes (Addendum)
Patient arrived by EMS from [facility] fire department for chest pain. Patient jumps from one topic to another in triage and cannot stay focused on any one question or topic. C/o left arm pain, sob, cp, loss of taste, and diarrhea. He also c/o hernia and states "all these doctors that have seen me and none of them have noticed my hernia." Patient reports he has been seen multiple times for same and no one can figure out what is wrong. Patient will not answer any question he is asked.

## 2019-08-08 NOTE — ED Triage Notes (Signed)
From fire station c/o left arm pain and sob.  Pain since yesterday.  Has been on tramadol and pain started after taking.   Pain [id number]/10fs257, afebrile.  98% RA

## 2019-08-08 NOTE — ED Notes (Signed)
Attempted to call report, informed nurse would call me back in 5 minutes

## 2019-08-08 NOTE — H&P (Addendum)
History and Physical    Larry Callahan KGM:[id number] DOB: July 12, 1955 DOA: 08/08/2019  PCP: [name], NP  Patient coming from: Home  I have personally briefly reviewed patient's old medical records in [place]  Chief Complaint: left UE pain and confusion  HPI: Larry Callahan is a 64 y.o. male with medical history significant forknown history of coronary artery disease, HFpEF, COPD, type 2 diabetes mellitus, hypertension, OSA on CPAP and dyslipidemia who presents with concerns of left upper extremity pain. Patient overall a poor historian and required frequent redirecting. Patient also is noted to have ED evaluation monthly for various complains. He reports that he has been having left forearm pain and recently called his primary physician and was given a pain medication that "starts with a T." This morning when he awoke he states that he could no longer lift his left upper extremity and had severe left forearm pain.  Denies any numbness or tingling.  He then called his PCP office again and was told to call EMS.  Then EMS arrived at his home and was told that he did not need to go to the ER.  He then went for a ride with his sister around [place] and tried to eat but had no appetite.  He states he has not eaten in 3 days.  He then got progressively felt more "sick" and confused and EMS was called and patient was brought to the ER for evaluation.  Patient reports to me that he otherwise had normal range of motion to his left upper extremity but this morning he could barely lifted.  Reports that about 12 years ago he had surgery to his wrist due to trauma.  No recent trauma.  Denies any chest pain or shortness of breath.  No nausea, vomiting.  ED Course:  He was afebrile and intermittently hypertensive at times up to 166/97 on room air.  WBC of 11.7, stable hemoglobin at 10.2 Potassium 2.7, glucose 234, creatinine of 2.34 from a baseline of around 2. Troponin of  24 and 25.  EKG without any significant T wave or ST changes.  Left forearm X-ray with no acute fractures.  Left upper extremity ultrasound negative for DVT.  He was given 500 cc of normal saline and given IV and oral potassium.  Review of Systems:  Constitutional: No Weight Change, No Fever ENT/Mouth: No sore throat, No Rhinorrhea Eyes: No Eye Pain, No Vision Changes Cardiovascular: No Chest Pain, no SOB Respiratory: No Cough, No Sputum, No Wheezing, no Dyspnea  Gastrointestinal: No Nausea, No Vomiting, Genitourinary: no Urinary Incontinence,  Musculoskeletal: No Arthralgias, No Myalgias Skin: No Skin Lesions, No Pruritus, Neuro: no Weakness, No Numbness,   Psych: No Anxiety/Panic, No Depression, no decrease appetite Heme/Lymph: No Bruising, No Bleeding  Past Medical History:  Diagnosis Date  . Allergy   . Coronary artery disease   . Diabetes mellitus without complication ([place])   . Diastolic CHF ([place])   . GERD (gastroesophageal reflux disease)   . Hyperlipidemia   . Hypertension   . Renal insufficiency   . Sleep apnea     Past Surgical History:  Procedure Laterality Date  . BACK SURGERY    . CHOLECYSTECTOMY    . COLONOSCOPY WITH PROPOFOL N/A 04/12/2019   Procedure: COLONOSCOPY WITH PROPOFOL;  Surgeon: [name], MD;  Location: [facility] ENDOSCOPY;  Service: Gastroenterology;  Laterality: N/A;  . ESOPHAGOGASTRODUODENOSCOPY N/A 04/12/2019   Procedure: ESOPHAGOGASTRODUODENOSCOPY (EGD);  Surgeon: [name], MD;  Location: [name] ENDOSCOPY;  Service: Gastroenterology;  Laterality: N/A;  . FLEXIBLE BRONCHOSCOPY Bilateral 05/17/2019   Procedure: FLEXIBLE BRONCHOSCOPY;  Surgeon: [name], MD;  Location: [name] ORS;  Service: Pulmonary;  Laterality: Bilateral;  . left arm surgery    . nephrectomy Left   . PARATHYROIDECTOMY    . RIGHT HEART CATH N/A 03/29/2019   Procedure: RIGHT HEART CATH;  Surgeon: [name], MD;  Location: [facility] LAB;   Service: Cardiovascular;  Laterality: N/A;     reports that he has quit smoking. His smoking use included cigarettes. He has quit using smokeless tobacco.  His smokeless tobacco use included chew. He reports that he does not drink alcohol or use drugs.  Allergies  Allergen Reactions  . Penicillins Rash    Family History  Problem Relation Age of Onset  . Diabetes Mother   . Lung cancer Father   . Diabetes Sister   . Hypertension Sister   . Heart disease Sister   . Cancer Sister   . Bone cancer Brother      Prior to Admission medications   Medication Sig Start Date End Date Taking? Authorizing Provider  acetaminophen (TYLENOL) 325 MG tablet Take 2 tablets (650 mg total) by mouth every 4 (four) hours as needed for headache or mild pain. 03/05/19  Yes Gouru, Illene Silver, MD  albuterol (VENTOLIN HFA) 108 (90 Base) MCG/ACT inhaler Inhale 2 puffs into the lungs every 6 (six) hours as needed for wheezing or shortness of breath. 03/05/19  Yes Gouru, Illene Silver, MD  amLODipine (NORVASC) 10 MG tablet Take 1 tablet (10 mg total) by mouth daily. 08/01/19  Yes [name], NP  Blood Glucose Calibration (TRUE METRIX LEVEL 1) Low SOLN Use as directed 04/04/18  Yes Boscia, Heather E, NP  Blood Glucose Monitoring Suppl (TRUE METRIX AIR GLUCOSE METER) DEVI 1 Device by Does not apply route 2 (two) times daily. 04/13/18  Yes [name], NP  ferrous gluconate (FERGON) 324 MG tablet Take 1 tablet (324 mg total) by mouth daily with breakfast. 08/01/19  Yes Boscia, Heather E, NP  furosemide (LASIX) 80 MG tablet Take 1 tablet (80 mg total) by mouth 2 (two) times daily. 08/01/19  Yes [name], NP  gemfibrozil (LOPID) 600 MG tablet Take 1 tablet (600 mg total) by mouth 2 (two) times daily with a meal. 06/27/19  Yes Boscia, Heather E, NP  glimepiride (AMARYL) 2 MG tablet Take 1 tablet (2 mg total) by mouth at bedtime. 08/02/19  Yes Lavera Guise, MD  glucose blood (TRUE METRIX BLOOD GLUCOSE TEST) test strip  Use as instructed twice daily diag E11.65 07/30/19  Yes Boscia, Greer Ee, NP  guaiFENesin (MUCINEX) 600 MG 12 hr tablet Take 1 tablet (600 mg total) by mouth 2 (two) times daily. 06/18/19  Yes Lavina Hamman, MD  hydrALAZINE (APRESOLINE) 100 MG tablet Take 1 tablet (100 mg total) by mouth 3 (three) times daily. 08/02/19  Yes Lavera Guise, MD  Insulin Glargine, 1 Unit Dial, 300 UNIT/ML SOPN Inject 26 Units into the skin daily.   Yes [provider]  ipratropium-albuterol (DUONEB) 0.5-2.5 (3) MG/3ML SOLN Take 3 mLs by nebulization every 6 (six) hours as needed. 03/08/19  Yes Scarboro, Audie Clear, NP  isosorbide mononitrate (IMDUR) 30 MG 24 hr tablet Take 1 tablet (30 mg total) by mouth daily. 08/02/19  Yes Boscia, Greer Ee, NP  loratadine (CLARITIN) 10 MG tablet Take 1 tablet (10 mg total) by mouth  daily. Patient taking differently: Take 10 mg by mouth daily as needed for allergies.  04/01/19  Yes Gouru, Illene Silver, MD  metoprolol succinate (TOPROL-XL) 50 MG 24 hr tablet Take 1 tablet (50 mg total) by mouth at bedtime. Take with or immediately following a meal. 07/10/19  Yes Lavera Guise, MD  nitroGLYCERIN (NITROSTAT) 0.4 MG SL tablet Place 1 tablet (0.4 mg total) under the tongue every 5 (five) minutes x 3 doses as needed for chest pain. 05/24/19  Yes Wieting, Richard, MD  NOVOFINE 32G X 6 MM MISC To use with [place] injections daily 12/13/18  Yes Boscia, Heather E, NP  omeprazole (PRILOSEC) 40 MG capsule 1 cap daily Patient taking differently: Take 40 mg by mouth daily.  07/10/19  Yes Lavera Guise, MD  oxyCODONE-acetaminophen (PERCOCET) 5-325 MG tablet Take 1 tablet by mouth every 4 (four) hours as needed for severe pain. 06/18/19 06/17/20 Yes Lavina Hamman, MD  sodium chloride (OCEAN) 0.65 % SOLN nasal spray Place 1 spray into both nostrils as needed for congestion. 06/18/19  Yes Lavina Hamman, MD  tiotropium (SPIRIVA HANDIHALER) 18 MCG inhalation capsule Place 1 capsule (18 mcg total) into inhaler and inhale  daily. 08/07/19 11/05/19 Yes Boscia, Greer Ee, NP  traMADol (ULTRAM) 50 MG tablet Take 1 tablet (50 mg total) by mouth every 12 (twelve) hours as needed for severe pain. 08/07/19 09/06/19 Yes Lavera Guise, MD  TRUEplus Lancets 33G MISC Use as directed twice daily diag E11.65 07/30/19  Yes Boscia, Greer Ee, NP  potassium chloride SA (KLOR-CON) 20 MEQ tablet Take 1 tablet (20 mEq total) by mouth daily. 05/24/19   Loletha Grayer, MD    Physical Exam: Vitals:   08/08/19 1830 08/08/19 1930 08/08/19 2000 08/08/19 2030  BP: (!) 166/97 (!) 154/77 (!) 162/85 (!) 168/84  Pulse: 86 95 97 97  Resp:  17 20 (!) 22  Temp:      TempSrc:      SpO2: 97% 98% 97% 95%  Weight:      Height:        Constitutional: NAD, calm, comfortable, nontoxic disheveled appearing male laying at 40 degree incline in bed and leaning onto his bed rail asleep. Vitals:   08/08/19 1830 08/08/19 1930 08/08/19 2000 08/08/19 2030  BP: (!) 166/97 (!) 154/77 (!) 162/85 (!) 168/84  Pulse: 86 95 97 97  Resp:  17 20 (!) 22  Temp:      TempSrc:      SpO2: 97% 98% 97% 95%  Weight:      Height:       Eyes: PERRL, lids and conjunctivae normal ENMT: Mucous membranes are moist.Missing dentition.  Neck: normal, supple Respiratory: clear to auscultation bilaterally, no wheezing, no crackles. Normal respiratory effort. No accessory muscle use.  Cardiovascular: Regular rate and rhythm, no murmurs / rubs / gallops. No extremity edema. 2+ radial pulse. Abdomen: no tenderness, no masses palpated. Bowel sounds positive.  Musculoskeletal: no clubbing / cyanosis. No joint deformity upper and lower extremities.  Only had 20 degree active range of motion with extension of the left upper extremity.  He was unable to tolerate passive range of motion and was screaming in pain and immediately wanted me to let go of his left forearm-a small bruise is noted on the forearm but otherwise no obvious erythema or deformities.  No pain with palpation of the  left shoulder and scapular region. Skin: no rashes, lesions, ulcers. No induration Neurologic: CN 2-12 grossly intact. Sensation  intact.  Strength 3/5 in all 4.  Psychiatric: Normal judgment and insight. Alert and oriented x 3. Normal mood.     Labs on Admission: I have personally reviewed following labs and imaging studies  CBC: Recent Labs  Lab 08/04/19 2005 08/08/19 1409  WBC 10.8* 11.7*  HGB 10.9* 10.2*  HCT 33.4* 30.4*  MCV 87.2 85.9  PLT 273 330   Basic Metabolic Panel: Recent Labs  Lab 08/04/19 2005 08/08/19 1409  NA 138 136  K 3.1* 2.7*  CL 103 103  CO2 21* 20*  GLUCOSE 135* 234*  BUN 53* 65*  CREATININE 2.09* 2.34*  CALCIUM 9.0 8.7*  MG  --  2.2   GFR: Estimated Creatinine Clearance: 39 mL/min (A) (by C-G formula based on SCr of 2.34 mg/dL (H)). Liver Function Tests: Recent Labs  Lab 08/04/19 2005 08/08/19 1416  AST 19 25  ALT 16 17  ALKPHOS 80 72  BILITOT 0.7 0.5  PROT 7.1 7.1  ALBUMIN 3.1* 2.8*   Recent Labs  Lab 08/08/19 1416  LIPASE 28   No results for input(s): AMMONIA in the last 168 hours. Coagulation Profile: No results for input(s): INR, PROTIME in the last 168 hours. Cardiac Enzymes: Recent Labs  Lab 08/04/19 2005  CKTOTAL 138   BNP (last 3 results) No results for input(s): PROBNP in the last 8760 hours. HbA1C: No results for input(s): HGBA1C in the last 72 hours. CBG: No results for input(s): GLUCAP in the last 168 hours. Lipid Profile: No results for input(s): CHOL, HDL, LDLCALC, TRIG, CHOLHDL, LDLDIRECT in the last 72 hours. Thyroid Function Tests: No results for input(s): TSH, T4TOTAL, FREET4, T3FREE, THYROIDAB in the last 72 hours. Anemia Panel: No results for input(s): VITAMINB12, FOLATE, FERRITIN, TIBC, IRON, RETICCTPCT in the last 72 hours. Urine analysis:    Component Value Date/Time   COLORURINE YELLOW (A) 08/08/2019 1657   APPEARANCEUR HAZY (A) 08/08/2019 1657   APPEARANCEUR Cloudy (A) 11/23/2018 1103    LABSPEC 1.012 08/08/2019 1657   LABSPEC 1.005 03/06/2013 0929   PHURINE 5.0 08/08/2019 1657   GLUCOSEU 50 (A) 08/08/2019 1657   GLUCOSEU Negative 03/06/2013 0929   HGBUR NEGATIVE 08/08/2019 1657   BILIRUBINUR NEGATIVE 08/08/2019 1657   BILIRUBINUR negative 02/20/2019 1404   BILIRUBINUR Negative 11/23/2018 1103   BILIRUBINUR Negative 03/06/2013 0929   KETONESUR NEGATIVE 08/08/2019 1657   PROTEINUR 100 (A) 08/08/2019 1657   UROBILINOGEN 0.2 02/20/2019 1404   NITRITE NEGATIVE 08/08/2019 1657   LEUKOCYTESUR NEGATIVE 08/08/2019 1657   LEUKOCYTESUR Negative 03/06/2013 0929    Radiological Exams on Admission: DG Chest 2 View  Result Date: 08/08/2019 CLINICAL DATA:  Chest pain EXAM: CHEST - 2 VIEW COMPARISON:  08/04/2019 FINDINGS: Heart size and pulmonary vascularity are normal and the lungs are clear. No effusions. No bone abnormality. IMPRESSION: Normal chest. Electronically Signed   By: Lorriane Shire M.D.   On: 08/08/2019 14:52    EKG: Independently reviewed.   Assessment/Plan  Left upper extremity pain and weakness  pain out of proportion with exam at left forearm.  Also only had about 20 degree extension with range of motion in patient reportedly had complete normal range of motion prior to today. Left forearm x-ray was negative.  Also obtain an left shoulder x-ray. Left upper extremity ultrasound negative for DVT. will obtain a uric acid for concerns of gout- although pain is mostly located on left forearm and not around any specific joint. 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  Admission creatinine of 2.34 Has received 500c of NS fluid in ED Avoid nephrotoxic drugs  Hypokalemia Replete. Mg of 2.2.  Chronic congestive heart failure compensated Hold Lasix- can continue with improvement of AKI continue metoprolol  Hypertension Continue  amlodipine, hydralazine, Imdur   COPD Stable.  Does not appear to be in acute exacerbation continue bronchodilators   Iron deficiency anemia Hemoglobin stable. Continue iron  OSA Continue CPAP  Type 2 diabetes hold home meds and Lantus 26 units daily start 20 units with moderate SSI  GERD  continue PPI  Morbid obesity BMI greater than 35  DVT prophylaxis:.Lovenox Code Status: Full Family Communication: Plan discussed with patient at bedside  disposition Plan: Home with observation Consults called:  Admission status: Observation    Oliana Gowens T Kailon Treese DO [facility]   If 7PM-7AM, please contact night-coverage [email or web address]   08/08/2019, 8:44 PM

## 2019-08-08 NOTE — Telephone Encounter (Signed)
Patient contacted office stating he had numbness in arms,vomitting, and chest pain. Advised patient he needed to hang up and call 911 immediately due to the symptoms he was experiencing.

## 2019-08-09 ENCOUNTER — Telehealth (HOSPITAL_COMMUNITY): Payer: Self-pay

## 2019-08-09 DIAGNOSIS — Z6835 Body mass index (BMI) 35.0-35.9, adult: Secondary | ICD-10-CM | POA: Diagnosis not present

## 2019-08-09 DIAGNOSIS — D509 Iron deficiency anemia, unspecified: Secondary | ICD-10-CM | POA: Diagnosis not present

## 2019-08-09 DIAGNOSIS — Z794 Long term (current) use of insulin: Secondary | ICD-10-CM | POA: Diagnosis not present

## 2019-08-09 DIAGNOSIS — R531 Weakness: Secondary | ICD-10-CM | POA: Diagnosis not present

## 2019-08-09 DIAGNOSIS — E1142 Type 2 diabetes mellitus with diabetic polyneuropathy: Secondary | ICD-10-CM | POA: Diagnosis not present

## 2019-08-09 DIAGNOSIS — N179 Acute kidney failure, unspecified: Secondary | ICD-10-CM | POA: Diagnosis not present

## 2019-08-09 DIAGNOSIS — N189 Chronic kidney disease, unspecified: Secondary | ICD-10-CM | POA: Diagnosis not present

## 2019-08-09 DIAGNOSIS — G4733 Obstructive sleep apnea (adult) (pediatric): Secondary | ICD-10-CM | POA: Diagnosis not present

## 2019-08-09 DIAGNOSIS — I5042 Chronic combined systolic (congestive) and diastolic (congestive) heart failure: Secondary | ICD-10-CM | POA: Diagnosis not present

## 2019-08-09 LAB — BASIC METABOLIC PANEL
Anion gap: 10 (ref 5–15)
BUN: 59 mg/dL — ABNORMAL HIGH (ref 8–23)
CO2: 21 mmol/L — ABNORMAL LOW (ref 22–32)
Calcium: 8.4 mg/dL — ABNORMAL LOW (ref 8.9–10.3)
Chloride: 107 mmol/L (ref 98–111)
Creatinine, Ser: 2.17 mg/dL — ABNORMAL HIGH (ref 0.61–1.24)
GFR calc Af Amer: 35 mL/min — ABNORMAL LOW (ref >60)
GFR calc non Af Amer: 31 mL/min — ABNORMAL LOW (ref >60)
Glucose, Bld: 189 mg/dL — ABNORMAL HIGH (ref 70–99)
Potassium: 2.8 mmol/L — ABNORMAL LOW (ref 3.5–5.1)
Sodium: 138 mmol/L (ref 135–145)

## 2019-08-09 LAB — CBC
HCT: 28.4 % — ABNORMAL LOW (ref 39.0–52.0)
Hemoglobin: 9.4 g/dL — ABNORMAL LOW (ref 13.0–17.0)
MCH: 28.8 pg (ref 26.0–34.0)
MCHC: 33.1 g/dL (ref 30.0–36.0)
MCV: 87.1 fL (ref 80.0–100.0)
Platelets: 278 10*3/uL (ref 150–400)
RBC: 3.26 MIL/uL — ABNORMAL LOW (ref 4.22–5.81)
RDW: 13 % (ref 11.5–15.5)
WBC: 9.5 10*3/uL (ref 4.0–10.5)
nRBC: 0 % (ref 0.0–0.2)

## 2019-08-09 LAB — GLUCOSE, CAPILLARY
Glucose-Capillary: 159 mg/dL — ABNORMAL HIGH (ref 70–99)
Glucose-Capillary: 146 mg/dL — ABNORMAL HIGH (ref 70–99)

## 2019-08-09 LAB — SARS CORONAVIRUS 2 (TAT 6-24 HRS): SARS Coronavirus 2: NEGATIVE

## 2019-08-09 LAB — HEMOGLOBIN A1C
Hgb A1c MFr Bld: 6.8 % — ABNORMAL HIGH (ref 4.8–5.6)
Mean Plasma Glucose: 148.46 mg/dL

## 2019-08-09 MED ORDER — POTASSIUM CHLORIDE 10 MEQ/100ML IV SOLN
10.0000 meq | INTRAVENOUS | Status: AC
  Administered 2019-08-09 (×2): 10 meq via INTRAVENOUS
  Filled 2019-08-09 (×2): qty 100

## 2019-08-09 MED ORDER — POTASSIUM CHLORIDE CRYS ER 20 MEQ PO TBCR: 20.0000 meq | EXTENDED_RELEASE_TABLET | Freq: Two times a day (BID) | ORAL | 0 refills | Status: DC

## 2019-08-09 MED ORDER — POTASSIUM CHLORIDE CRYS ER 20 MEQ PO TBCR
40.0000 meq | EXTENDED_RELEASE_TABLET | Freq: Once | ORAL | Status: AC
  Administered 2019-08-09: 08:00:00 40 meq via ORAL
  Filled 2019-08-09: qty 2

## 2019-08-09 NOTE — Discharge Summary (Signed)
Physician Discharge Summary  Whitman Meinhardt Backhaus POE:[id number] DOB: 05/08/1956 DOA: 08/08/2019  PCP: [name], NP  Admit date: 08/08/2019 Discharge date: 08/09/2019  Admitted From: Home  Disposition:  Home   Recommendations for Outpatient Follow-up:  1. Follow up with PCP in 1-2 weeks 2. Please obtain BMP in one week to check K    Home Health: None  Equipment/Devices: None  Discharge Condition: Fair  CODE STATUS: FULL Diet recommendation: Cardiac, diabetic  Brief/Interim Summary: [name] is a 64 y.o. M with CAD, dCHF, DM, CKD IV s/p left nephrectomy, OSA on CPAP, MO, and HTN who presented with various complaints of primarily left upper extremity discomfort.  Patient was in his usual state of health until recently was treated with tramadol for left forearm pain.  In the morning of admission, he found that he could not lift his left upper extremity at all.  Her, his electrolytes and renal function were at baseline.  Radiograph of the left forearm showed no fracture.  Ultrasound of left upper extremity was negative for DVT.      PRINCIPAL HOSPITAL DIAGNOSIS: Left arm pain, probably muscle spasm    Discharge Diagnoses:   Left upper extremity pain The patient was admitted due to complaints on "left arm weakness".  Notes suggest, and my interview with the patient confirmed, he conflated "pain" and "weakness", in the sense that he could not reliably distinguish between the two.  On exam, he had no objective deficits in left hand or arm strength, despite his complaint of continued arm "weakness".  An MRI obtained showed no stroke.  TIA was strongly doubted.  My clinical opinion is that he has a muscle spasm or possibly a peripheral neuropathy that is painful, perhaps related to his old ORIF of left forearm, and which limits his movement.  Urate elevated but pain not localized to a joint (it was repeatedly and consistently identified by the patient over the mid inner  left forearm).  Joints also not swollen nor tender at all throughout the left hand.   Without rubor, tenderness to palpation, swelling, leukocytosis or fever, no obvious breaks in skin, I do not favor that this is infection.  Follow up with PCP.   Hypokalemia Supplemented in hospital.  Repeat K in 1 week  Coronary disease secondary prevention Hypertension Continue amlodipine, gemfibrozil, Imdur, metoprolol, hydralazine  COPD without exacerbation Continue Spiriva  Chronic diastolic CHF Appears euvolemic.  Continue furosemide.  Diabetes Continue glimepiride, Lantus and aspart  Chronic kidney disease stage IV  Sleep apnea  Morbid obesity BMI 33  Anemia of chronic kidney disease Continue iron          Discharge Instructions  Discharge Instructions    Diet - low sodium heart healthy   Complete by: As directed    Discharge instructions   Complete by: As directed    Call your primary care doctor for an appointment in 1 week Have your labs checked next week  Resume all your home medicines as above  CHANGE your potassium: Take potassium 20 mEq TWICE daily until you have your labs checked When you have your labs checked, ask your primary doctor how much potassium to take after that   Increase activity slowly   Complete by: As directed      Allergies as of 08/09/2019      Reactions   Penicillins Rash      Medication List    STOP taking these medications   traMADol 50 MG tablet Commonly known  as: ULTRAM     TAKE these medications   acetaminophen 325 MG tablet Commonly known as: TYLENOL Take 2 tablets (650 mg total) by mouth every 4 (four) hours as needed for headache or mild pain.   albuterol 108 (90 Base) MCG/ACT inhaler Commonly known as: VENTOLIN HFA Inhale 2 puffs into the lungs every 6 (six) hours as needed for wheezing or shortness of breath.   amLODipine 10 MG tablet Commonly known as: NORVASC Take 1 tablet (10 mg total) by mouth daily.    ferrous gluconate 324 MG tablet Commonly known as: FERGON Take 1 tablet (324 mg total) by mouth daily with breakfast.   furosemide 80 MG tablet Commonly known as: LASIX Take 1 tablet (80 mg total) by mouth 2 (two) times daily.   gemfibrozil 600 MG tablet Commonly known as: LOPID Take 1 tablet (600 mg total) by mouth 2 (two) times daily with a meal.   glimepiride 2 MG tablet Commonly known as: AMARYL Take 1 tablet (2 mg total) by mouth at bedtime.   guaiFENesin 600 MG 12 hr tablet Commonly known as: Mucinex Take 1 tablet (600 mg total) by mouth 2 (two) times daily.   hydrALAZINE 100 MG tablet Commonly known as: APRESOLINE Take 1 tablet (100 mg total) by mouth 3 (three) times daily.   Insulin Glargine (1 Unit Dial) 300 UNIT/ML Sopn Inject 26 Units into the skin daily.   ipratropium-albuterol 0.5-2.5 (3) MG/3ML Soln Commonly known as: DUONEB Take 3 mLs by nebulization every 6 (six) hours as needed.   isosorbide mononitrate 30 MG 24 hr tablet Commonly known as: IMDUR Take 1 tablet (30 mg total) by mouth daily.   loratadine 10 MG tablet Commonly known as: CLARITIN Take 1 tablet (10 mg total) by mouth daily. What changed:   when to take this  reasons to take this   metoprolol succinate 50 MG 24 hr tablet Commonly known as: TOPROL-XL Take 1 tablet (50 mg total) by mouth at bedtime. Take with or immediately following a meal.   nitroGLYCERIN 0.4 MG SL tablet Commonly known as: NITROSTAT Place 1 tablet (0.4 mg total) under the tongue every 5 (five) minutes x 3 doses as needed for chest pain.   NovoFine 32G X 6 MM Misc Generic drug: Insulin Pen Needle To use with Powell injections daily   omeprazole 40 MG capsule Commonly known as: PRILOSEC 1 cap daily What changed: how to take this   oxyCODONE-acetaminophen 5-325 MG tablet Commonly known as: Percocet Take 1 tablet by mouth every 4 (four) hours as needed for severe pain.   potassium chloride SA 20 MEQ  tablet Commonly known as: KLOR-CON Take 1 tablet (20 mEq total) by mouth 2 (two) times daily. What changed: when to take this   sodium chloride 0.65 % Soln nasal spray Commonly known as: OCEAN Place 1 spray into both nostrils as needed for congestion.   Spiriva HandiHaler 18 MCG inhalation capsule Generic drug: tiotropium Place 1 capsule (18 mcg total) into inhaler and inhale daily.   True Metrix Air Glucose Meter Devi 1 Device by Does not apply route 2 (two) times daily.   True Metrix Blood Glucose Test test strip Generic drug: glucose blood Use as instructed twice daily diag E11.65   True Metrix Level 1 Low Soln Use as directed   TRUEplus Lancets 33G Misc Use as directed twice daily diag E11.65       Allergies  Allergen Reactions  . Penicillins Rash    Consultations:  Procedures/Studies: DG Chest 2 View  Result Date: 08/08/2019 CLINICAL DATA:  Chest pain EXAM: CHEST - 2 VIEW COMPARISON:  08/04/2019 FINDINGS: Heart size and pulmonary vascularity are normal and the lungs are clear. No effusions. No bone abnormality. IMPRESSION: Normal chest. Electronically Signed   By: Lorriane Shire M.D.   On: 08/08/2019 14:52   DG Chest 2 View  Result Date: 08/04/2019 CLINICAL DATA:  Bilateral lower extremity weakness for 3 days EXAM: CHEST - 2 VIEW COMPARISON:  06/17/2019 FINDINGS: The heart size and mediastinal contours are within normal limits. Both lungs are clear. The visualized skeletal structures are unremarkable. IMPRESSION: No active cardiopulmonary disease. Electronically Signed   By: Randa Ngo M.D.   On: 08/04/2019 20:23   DG Forearm Left  Result Date: 08/08/2019 CLINICAL DATA:  Forearm pain and swelling EXAM: LEFT FOREARM - 2 VIEW COMPARISON:  None. FINDINGS: Surgical plate and fixating screws within the shafts of the radius and ulna. No fracture or malalignment. Soft tissue swelling is present. IMPRESSION: Postsurgical changes of the shafts of the radius and  ulna. No acute osseous abnormality. Electronically Signed   By: Donavan Foil M.D.   On: 08/08/2019 18:22   US Venous Img Upper Uni Left  Result Date: 08/08/2019 CLINICAL DATA:  Left forearm pain and swelling EXAM: LEFT UPPER EXTREMITY VENOUS DOPPLER ULTRASOUND TECHNIQUE: Gray-scale sonography with graded compression, as well as color Doppler and duplex ultrasound were performed to evaluate the upper extremity deep venous system from the level of the subclavian vein and including the jugular, axillary, basilic, radial, ulnar and upper cephalic vein. Spectral Doppler was utilized to evaluate flow at rest and with distal augmentation maneuvers. COMPARISON:  None. FINDINGS: Contralateral Subclavian Vein: Respiratory phasicity is normal and symmetric with the symptomatic side. No evidence of thrombus. Normal compressibility. Internal Jugular Vein: No evidence of thrombus. Normal compressibility, respiratory phasicity and response to augmentation. Subclavian Vein: No evidence of thrombus. Normal compressibility, respiratory phasicity and response to augmentation. Axillary Vein: No evidence of thrombus. Normal compressibility, respiratory phasicity and response to augmentation. Cephalic Vein: No evidence of thrombus. Normal compressibility, respiratory phasicity and response to augmentation. Basilic Vein: No evidence of thrombus. Normal compressibility, respiratory phasicity and response to augmentation. Brachial Veins: No evidence of thrombus. Normal compressibility, respiratory phasicity and response to augmentation. Radial Veins: No evidence of thrombus. Normal compressibility, respiratory phasicity and response to augmentation. Ulnar Veins: No evidence of thrombus. Normal compressibility, respiratory phasicity and response to augmentation. Venous Reflux:  None visualized. Other Findings:  None visualized. IMPRESSION: No evidence of DVT within the left upper extremity. Electronically Signed   By: Macy Mis  M.D.   On: 08/08/2019 18:42   DG Shoulder Left  Result Date: 08/08/2019 CLINICAL DATA:  Left arm pain with motion EXAM: LEFT SHOULDER - 2+ VIEW COMPARISON:  None. FINDINGS: Frontal and transscapular views of the left shoulder are obtained. Marked hypertrophic changes of the acromioclavicular joint. Mild glenohumeral joint space narrowing. No fracture, subluxation, or dislocation. Left chest is clear. IMPRESSION: 1. Left shoulder osteoarthritis.  No acute bony abnormality. Electronically Signed   By: Randa Ngo M.D.   On: 08/08/2019 21:20      Subjective: Feeling at baseline.  Still has subjective weakness in left hand/arm. Still has intermittent pain in left forearm with "certain movements".  No focal numbness, no speech disturbance.  Discharge Exam: Vitals:   08/08/19 2245 08/09/19 0823  BP: (!) 193/93 (!) 157/87  Pulse: 91 87  Resp: 16 16  Temp:  97.8 F (36.6 C) 98.3 F (36.8 C)  SpO2: 98% 98%   Vitals:   08/08/19 2130 08/08/19 2200 08/08/19 2245 08/09/19 0823  BP:  (!) 151/86 (!) 193/93 (!) 157/87  Pulse: 100 89 91 87  Resp:  (!) 22 16 16   Temp:   97.8 F (36.6 C) 98.3 F (36.8 C)  TempSrc:   Oral   SpO2: 98% 96% 98% 98%  Weight:      Height:        General: Pt is alert, awake, not in acute distress, sitting on edge of bed Cardiovascular: RRR, nl S1-S2, no murmurs appreciated.   No LE edema.   Respiratory: Normal respiratory rate and rhythm.  CTAB without rales or wheezes. Abdominal: Abdomen soft and non-tender.  No distension or HSM.   MSK: No redness, swelling induration of the left hand, forearm.  His pain is located in the vicinity of a very old scar (from a plate placed for a broken radius).  There is no induration, deformity, rednness, swelling. Neuro/Psych: Strength symmetric in upper and lower extremities.  Judgment and insight appear normal.   The results of significant diagnostics from this hospitalization (including imaging, microbiology, ancillary and  laboratory) are listed below for reference.     Microbiology: Recent Results (from the past 240 hour(s))  SARS CORONAVIRUS 2 (TAT 6-24 HRS) Nasopharyngeal Nasopharyngeal Swab     Status: None   Collection Time: 08/08/19  9:26 PM   Specimen: Nasopharyngeal Swab  Result Value Ref Range Status   SARS Coronavirus 2 NEGATIVE NEGATIVE Final    Comment: (NOTE) SARS-CoV-2 target nucleic acids are NOT DETECTED. The SARS-CoV-2 RNA is generally detectable in upper and lower respiratory specimens during the acute phase of infection. Negative results do not preclude SARS-CoV-2 infection, do not rule out co-infections with other pathogens, and should not be used as the sole basis for treatment or other patient management decisions. Negative results must be combined with clinical observations, patient history, and epidemiological information. The expected result is Negative. Fact Sheet for Patients: SugarRoll.be Fact Sheet for Healthcare Providers: https://www.woods-mathews.com/ This test is not yet approved or cleared by the [place] FDA and  has been authorized for detection and/or diagnosis of SARS-CoV-2 by FDA under an Emergency Use Authorization (EUA). This EUA will remain  in effect (meaning this test can be used) for the duration of the COVID-19 declaration under Section 56 4(b)(1) of the Act, 21 U.S.C. section 360bbb-3(b)(1), unless the authorization is terminated or revoked sooner. Performed at [facility], [place] [address] [place], [place] [address]      Labs: BNP (last 3 results) Recent Labs    04/24/19 1122 05/10/19 1640 05/19/19 1522  BNP 88.0 75.0 182.9*   Basic Metabolic Panel: Recent Labs  Lab 08/04/19 2005 08/08/19 1409 08/09/19 0427  NA 138 136 138  K 3.1* 2.7* 2.8*  CL 103 103 107  CO2 21* 20* 21*  GLUCOSE 135* 234* 189*  BUN 53* 65* 59*  CREATININE 2.09* 2.34* 2.17*  CALCIUM 9.0 8.7* 8.4*  MG  --  2.2   --    Liver Function Tests: Recent Labs  Lab 08/04/19 2005 08/08/19 1416  AST 19 25  ALT 16 17  ALKPHOS 80 72  BILITOT 0.7 0.5  PROT 7.1 7.1  ALBUMIN 3.1* 2.8*   Recent Labs  Lab 08/08/19 1416  LIPASE 28   No results for input(s): AMMONIA in the last 168 hours. CBC: Recent Labs  Lab 08/04/19 2005  08/08/19 1409 08/09/19 0427  WBC 10.8* 11.7* 9.5  HGB 10.9* 10.2* 9.4*  HCT 33.4* 30.4* 28.4*  MCV 87.2 85.9 87.1  PLT 273 319 278   Cardiac Enzymes: Recent Labs  Lab 08/04/19 2005  CKTOTAL 138   BNP: Invalid input(s): POCBNP CBG: Recent Labs  Lab 08/09/19 0322 08/09/19 0824  GLUCAP 159* 146*   D-Dimer No results for input(s): DDIMER in the last 72 hours. Hgb A1c Recent Labs    08/08/19 1409  HGBA1C 6.8*   Lipid Profile No results for input(s): CHOL, HDL, LDLCALC, TRIG, CHOLHDL, LDLDIRECT in the last 72 hours. Thyroid function studies No results for input(s): TSH, T4TOTAL, T3FREE, THYROIDAB in the last 72 hours.  Invalid input(s): FREET3 Anemia work up No results for input(s): VITAMINB12, FOLATE, FERRITIN, TIBC, IRON, RETICCTPCT in the last 72 hours. Urinalysis    Component Value Date/Time   COLORURINE YELLOW (A) 08/08/2019 1657   APPEARANCEUR HAZY (A) 08/08/2019 1657   APPEARANCEUR Cloudy (A) 11/23/2018 1103   LABSPEC 1.012 08/08/2019 1657   LABSPEC 1.005 03/06/2013 0929   PHURINE 5.0 08/08/2019 1657   GLUCOSEU 50 (A) 08/08/2019 1657   GLUCOSEU Negative 03/06/2013 0929   HGBUR NEGATIVE 08/08/2019 1657   BILIRUBINUR NEGATIVE 08/08/2019 1657   BILIRUBINUR negative 02/20/2019 1404   BILIRUBINUR Negative 11/23/2018 1103   BILIRUBINUR Negative 03/06/2013 0929   KETONESUR NEGATIVE 08/08/2019 1657   PROTEINUR 100 (A) 08/08/2019 1657   UROBILINOGEN 0.2 02/20/2019 1404   NITRITE NEGATIVE 08/08/2019 1657   LEUKOCYTESUR NEGATIVE 08/08/2019 1657   LEUKOCYTESUR Negative 03/06/2013 0929   Sepsis Labs Invalid input(s): PROCALCITONIN,  WBC,   LACTICIDVEN Microbiology Recent Results (from the past 240 hour(s))  SARS CORONAVIRUS 2 (TAT 6-24 HRS) Nasopharyngeal Nasopharyngeal Swab     Status: None   Collection Time: 08/08/19  9:26 PM   Specimen: Nasopharyngeal Swab  Result Value Ref Range Status   SARS Coronavirus 2 NEGATIVE NEGATIVE Final    Comment: (NOTE) SARS-CoV-2 target nucleic acids are NOT DETECTED. The SARS-CoV-2 RNA is generally detectable in upper and lower respiratory specimens during the acute phase of infection. Negative results do not preclude SARS-CoV-2 infection, do not rule out co-infections with other pathogens, and should not be used as the sole basis for treatment or other patient management decisions. Negative results must be combined with clinical observations, patient history, and epidemiological information. The expected result is Negative. Fact Sheet for Patients: SugarRoll.be Fact Sheet for Healthcare Providers: https://www.woods-mathews.com/ This test is not yet approved or cleared by the [place] FDA and  has been authorized for detection and/or diagnosis of SARS-CoV-2 by FDA under an Emergency Use Authorization (EUA). This EUA will remain  in effect (meaning this test can be used) for the duration of the COVID-19 declaration under Section 56 4(b)(1) of the Act, 21 U.S.C. section 360bbb-3(b)(1), unless the authorization is terminated or revoked sooner. Performed at [facility], [place] [address], [place], [place] [address]      Time coordinating discharge: 25 minutes The [place] controlled substances registry was reviewed for this patient        SIGNED:   Edwin Dada, MD  [facility] 08/09/2019, 12:26 PM

## 2019-08-09 NOTE — Progress Notes (Signed)
RN called RT to patient bedside for CPAP placement. However, patient is currently on airborne precautions and has a pending COVID test. Options discussed with patient and patient decided on 2L [place] [facility] until COVID test results are released. RN at bedside and aware, will continue to monitor. Patient speaking in full complete sentences, no distress noted.

## 2019-08-09 NOTE — Progress Notes (Signed)
Discharge education reviewed with patient and IV removed. Patient states that his sister will be taking him home.

## 2019-08-09 NOTE — Telephone Encounter (Signed)
Attempted to contact, left message due to no answer.  Will continue to try to contact due to recent ED visits.   [place] [phone]

## 2019-08-09 NOTE — Telephone Encounter (Signed)
Larry Callahan contacted me about pain in his legs and he was afraid he would fall.  Advised him to use his cane or walker.  He states does not have a walker.  He states he can use his cane.  He does states still have physical therapy coming and thye states he has been doing well.  Advised if he is too concerned to get checked out at [facility] urgent care or ED.   [place] [phone]

## 2019-08-09 NOTE — TOC Transition Note (Signed)
Transition of Care [facility]) - CM/SW Discharge Note   Patient Details  Name: Larry Callahan MRN: [id number] Date of Birth: 1955/11/05  Transition of Care [facility]) CM/SW Contact:  Elease Hashimoto, LCSW Phone Number: 08/09/2019, 10:13 AM   Clinical Narrative: Met briefly with pt who lives alone and is independent . He came to the  Hospital due to his arm hurting and inability to eat. He feels better today and aware of discharge. His sister is involved and assists him with transportation. Pt feels prepared to go home today. No equipment or follow up needs.  Wants to go home before the rain due to sister doesn't like driving in the rain. Bedside RN aware.         Barriers to Discharge: No Barriers Identified   Patient Goals and CMS Choice Patient states their goals for this hospitalization and ongoing recovery are:: I'm ready to go home      Discharge Placement                       Discharge Plan and Services In-house Referral: Clinical Social Work   Post Acute Care Choice: NA                               Social Determinants of Health (SDOH) Interventions     Readmission Risk Interventions Readmission Risk Prevention Plan 05/10/2019 04/27/2019  Transportation Screening Complete Complete  Medication Review Press photographer) Complete Complete  PCP or Specialist appointment within 3-5 days of discharge - Complete  HRI or [place] - Complete  SW Recovery Care/Counseling Consult - Not Complete  SW Consult Not Complete Comments - not needed  Palliative Care Screening Not Applicable Not [place] Not Applicable Not Applicable  Some recent data might be hidden

## 2019-08-11 DIAGNOSIS — G629 Polyneuropathy, unspecified: Secondary | ICD-10-CM | POA: Diagnosis not present

## 2019-08-11 DIAGNOSIS — M79662 Pain in left lower leg: Secondary | ICD-10-CM | POA: Diagnosis not present

## 2019-08-11 DIAGNOSIS — R262 Difficulty in walking, not elsewhere classified: Secondary | ICD-10-CM | POA: Diagnosis not present

## 2019-08-11 DIAGNOSIS — J449 Chronic obstructive pulmonary disease, unspecified: Secondary | ICD-10-CM | POA: Diagnosis not present

## 2019-08-11 DIAGNOSIS — M25561 Pain in right knee: Secondary | ICD-10-CM | POA: Diagnosis not present

## 2019-08-11 DIAGNOSIS — M79661 Pain in right lower leg: Secondary | ICD-10-CM | POA: Diagnosis not present

## 2019-08-11 DIAGNOSIS — E1142 Type 2 diabetes mellitus with diabetic polyneuropathy: Secondary | ICD-10-CM | POA: Diagnosis not present

## 2019-08-11 DIAGNOSIS — R269 Unspecified abnormalities of gait and mobility: Secondary | ICD-10-CM | POA: Diagnosis not present

## 2019-08-11 DIAGNOSIS — M542 Cervicalgia: Secondary | ICD-10-CM | POA: Diagnosis not present

## 2019-08-11 DIAGNOSIS — R2689 Other abnormalities of gait and mobility: Secondary | ICD-10-CM | POA: Diagnosis not present

## 2019-08-11 DIAGNOSIS — G4733 Obstructive sleep apnea (adult) (pediatric): Secondary | ICD-10-CM | POA: Diagnosis not present

## 2019-08-11 DIAGNOSIS — Z20822 Contact with and (suspected) exposure to covid-19: Secondary | ICD-10-CM | POA: Diagnosis not present

## 2019-08-11 DIAGNOSIS — I5032 Chronic diastolic (congestive) heart failure: Secondary | ICD-10-CM | POA: Diagnosis not present

## 2019-08-11 DIAGNOSIS — I110 Hypertensive heart disease with heart failure: Secondary | ICD-10-CM | POA: Diagnosis not present

## 2019-08-12 DIAGNOSIS — G6289 Other specified polyneuropathies: Secondary | ICD-10-CM | POA: Diagnosis not present

## 2019-08-12 DIAGNOSIS — G629 Polyneuropathy, unspecified: Secondary | ICD-10-CM | POA: Diagnosis not present

## 2019-08-12 MED ORDER — AMLODIPINE BESYLATE 10 MG PO TABS
10.00 | ORAL_TABLET | ORAL | Status: DC
Start: 2019-08-13 — End: 2019-08-12

## 2019-08-12 MED ORDER — GENERIC EXTERNAL MEDICATION
Status: DC
Start: ? — End: 2019-08-12

## 2019-08-12 MED ORDER — ENOXAPARIN SODIUM 40 MG/0.4ML ~~LOC~~ SOLN
40.00 | SUBCUTANEOUS | Status: DC
Start: 2019-08-12 — End: 2019-08-12

## 2019-08-12 MED ORDER — FUROSEMIDE 80 MG PO TABS
80.00 | ORAL_TABLET | ORAL | Status: DC
Start: 2019-08-13 — End: 2019-08-12

## 2019-08-12 MED ORDER — POLYETHYLENE GLYCOL 3350 17 GM/SCOOP PO POWD
17.00 | ORAL | Status: DC
Start: ? — End: 2019-08-12

## 2019-08-12 MED ORDER — HYDRALAZINE HCL 100 MG PO TABS
100.00 | ORAL_TABLET | ORAL | Status: DC
Start: 2019-08-12 — End: 2019-08-12

## 2019-08-12 MED ORDER — GABAPENTIN 100 MG PO CAPS
100.00 | ORAL_CAPSULE | ORAL | Status: DC
Start: 2019-08-12 — End: 2019-08-12

## 2019-08-12 MED ORDER — INSULIN GLARGINE 100 UNIT/ML ~~LOC~~ SOLN
26.00 | SUBCUTANEOUS | Status: DC
Start: 2019-08-13 — End: 2019-08-12

## 2019-08-12 MED ORDER — MELATONIN 3 MG PO TABS
6.00 | ORAL_TABLET | ORAL | Status: DC
Start: ? — End: 2019-08-12

## 2019-08-12 MED ORDER — IPRATROPIUM BROMIDE 0.02 % IN SOLN
500.00 | RESPIRATORY_TRACT | Status: DC
Start: 2019-08-12 — End: 2019-08-12

## 2019-08-12 MED ORDER — INSULIN LISPRO 100 UNIT/ML ~~LOC~~ SOLN
0.00 | SUBCUTANEOUS | Status: DC
Start: 2019-08-12 — End: 2019-08-12

## 2019-08-12 MED ORDER — DEXTROSE 50 % IV SOLN
12.50 | INTRAVENOUS | Status: DC
Start: ? — End: 2019-08-12

## 2019-08-12 MED ORDER — IPRATROPIUM-ALBUTEROL 0.5-2.5 (3) MG/3ML IN SOLN
3.00 | RESPIRATORY_TRACT | Status: DC
Start: ? — End: 2019-08-12

## 2019-08-12 MED ORDER — ISOSORBIDE MONONITRATE ER 30 MG PO TB24
30.00 | ORAL_TABLET | ORAL | Status: DC
Start: 2019-08-13 — End: 2019-08-12

## 2019-08-12 MED ORDER — ACETAMINOPHEN 325 MG PO TABS
650.00 | ORAL_TABLET | ORAL | Status: DC
Start: ? — End: 2019-08-12

## 2019-08-12 MED ORDER — METOPROLOL SUCCINATE ER 50 MG PO TB24
50.00 | ORAL_TABLET | ORAL | Status: DC
Start: 2019-08-13 — End: 2019-08-12

## 2019-08-15 ENCOUNTER — Telehealth: Payer: Self-pay | Admitting: Cardiovascular Disease

## 2019-08-15 NOTE — Telephone Encounter (Signed)
Pt c/o swelling: STAT is pt has developed SOB within 24 hours  1) How much weight have you gained and in what time span? no  2) If swelling, where is the swelling located? Feet, says they look like they are going to bust  3) Are you currently taking a fluid pill? Yes, 2 a day  4) Are you currently SOB? no  5) Do you have a log of your daily weights (if so, list)? No weight gain  6) Have you gained 3 pounds in a day or 5 pounds in a week? no  7) Have you traveled recently? No, but have been walking a lot lately  Please advise  Does not want to go to the hospital, already been and wont go wait another 3 hours.

## 2019-08-15 NOTE — Telephone Encounter (Signed)
Attempted to call the patient. No answer- I left a message to please call back.  

## 2019-08-20 NOTE — Telephone Encounter (Signed)
No answer. Left message to call back to discuss symptoms.  Patient scheduled on 08/23/19 with Dr Rockey Situ. Advised patient to call back if still having issues.

## 2019-08-21 NOTE — Patient Instructions (Signed)
Hemoptysis  Hemoptysis is when you cough up blood. It can be mild or serious. If it is mild, you may cough up bloody spit and mucus (sputum). If you cough up 1-2 cups (240-480 mL) of blood within 24 hours (massive hemoptysis), it is an emergency. If you cough up blood, it is important to go and see your doctor. Follow these instructions at home:  Watch your condition for any changes.  Take over-the-counter and prescription medicines only as told by your doctor.  If you were prescribed an antibiotic medicine, take it as told by your doctor. Do not stop taking the antibiotic even if you start to feel better.  Go back to your normal activities as told by your doctor. Ask your doctor what activities are safe for you to do.  Do not use any products that contain nicotine or tobacco. These include cigarettes and e-cigarettes. If you need help quitting, ask your doctor.  Keep all follow-up visits as told by your doctor. This is important. Contact a doctor if:  You have a fever.  You cough up bloody spit and mucus. Get help right away if:  You cough up fresh blood or blood clots.  You have trouble breathing.  You have chest pain. This information is not intended to replace advice given to you by your health care provider. Make sure you discuss any questions you have with your health care provider. Document Revised: 05/12/2017 Document Reviewed: 02/26/2016 Elsevier Patient Education  2020 [employer].  

## 2019-08-22 ENCOUNTER — Telehealth (HOSPITAL_COMMUNITY): Payer: Self-pay

## 2019-08-22 NOTE — Progress Notes (Signed)
Cardiology Office Note  Date:  08/23/2019   ID:  Larry Callahan, Larry Callahan 29-Oct-1955, MRN [id number]  PCP:  Larry Freshwater, NP   Chief Complaint  Patient presents with  . office visit    Hospital F/U    HPI:  Larry Callahan is a 64 y.o. male with a hx of  morbid obesity,  hypertension poorly controlled,  type 2 diabetes  smoker,  chronic renal insufficiency,  single kidney,  obstructive sleep apnea on CPAP,    presenting to the emergency room September 2020 with shortness of breath on exertion Presents for routine follow-up of his new shortness of breath, diastolic dysfunction  Frequent trips to the emergency room for various issues  Seen in the ER [facility] 08/12/2018: leg pain peripheral neuropathy. Started on gabapentin  Recently in the hospital with discharge August 09, 2019 Was admitted for left arm pain, possible muscle spasm  Prior to that was in the emergency room 4 days earlier for bilateral leg pain, peripheral neuropathy.  Was in hospital January 2021 with acute right hip pain  In the hospital December 2020 with COPD exacerbation, pneumonia Several days prior was in the hospital for recurrent hemoptysis, work-up unrevealing  Thinks breathing improving Weight trending down Sometimes with a cane Leg pain better on gabapentin  Lab work reviewed CR >2 HBA1C 6.8  Quit smoking summer 2020  EKG personally reviewed by myself on todays visit Shows normal sinus rhythm with rate 76 bpm no significant ST or T wave changes   Details of recent hospitalization reviewed with him several recent trips to the emergency room for similar symptoms of shortness of breath on exertion  Recent work-up February 20, 2019 for shortness of breath   seen December 29, 2018 for similar symptoms of shortness of breath on exertion, at that time hemoglobin 9.6, creatinine 1.63  Blood pressure typically running high Presented to the hospital with midsternal nonradiating  chest pain And hypertension   echocardiogram July 2020 Report not in the computer Per primary care office notes had normal LV function  left heart catheterization 2011 Nonobstructive disease at that time Nondominant RCA  Diagnosed with hypertensive heart disease with diastolic CHF, underlying COPD given long history of smoking  morbid obesity, BNP 322 Treated with IV Lasix  CT scan chest with no PE   significant coronary calcification through the LAD Unable to exclude underlying ischemia, unable several risk factors including poorly controlled diabetes --Stress test performed showing no ischemia   PMH:   has a past medical history of Allergy, Coronary artery disease, Diabetes mellitus without complication ([place]), Diastolic CHF ([place]), GERD (gastroesophageal reflux disease), Hyperlipidemia, Hypertension, Renal insufficiency, and Sleep apnea.  PSH:    Past Surgical History:  Procedure Laterality Date  . BACK SURGERY    . CARDIAC CATHETERIZATION    . CHOLECYSTECTOMY    . COLONOSCOPY WITH PROPOFOL N/A 04/12/2019   Procedure: COLONOSCOPY WITH PROPOFOL;  Surgeon: [name], MD;  Location: [facility] ENDOSCOPY;  Service: Gastroenterology;  Laterality: N/A;  . ESOPHAGOGASTRODUODENOSCOPY N/A 04/12/2019   Procedure: ESOPHAGOGASTRODUODENOSCOPY (EGD);  Surgeon: [name], MD;  Location: [facility] ENDOSCOPY;  Service: Gastroenterology;  Laterality: N/A;  . FLEXIBLE BRONCHOSCOPY Bilateral 05/17/2019   Procedure: FLEXIBLE BRONCHOSCOPY;  Surgeon: [name], MD;  Location: [name] ORS;  Service: Pulmonary;  Laterality: Bilateral;  . left arm surgery    . nephrectomy Left   . PARATHYROIDECTOMY    . RIGHT HEART CATH N/A 03/29/2019   Procedure: RIGHT HEART CATH;  Surgeon: [name], MD;  Location: [place] CV LAB;  Service: Cardiovascular;  Laterality: N/A;    Current Outpatient Medications  Medication Sig Dispense Refill  . acetaminophen (TYLENOL) 325 MG tablet Take 2  tablets (650 mg total) by mouth every 4 (four) hours as needed for headache or mild pain.    Marland Kitchen albuterol (VENTOLIN HFA) 108 (90 Base) MCG/ACT inhaler Inhale 2 puffs into the lungs every 6 (six) hours as needed for wheezing or shortness of breath. 18 g 0  . amLODipine (NORVASC) 10 MG tablet Take 1 tablet (10 mg total) by mouth daily. 90 tablet 2  . Blood Glucose Calibration (TRUE METRIX LEVEL 1) Low SOLN Use as directed 1 each 1  . Blood Glucose Monitoring Suppl (TRUE METRIX AIR GLUCOSE METER) DEVI 1 Device by Does not apply route 2 (two) times daily. 1 Device 0  . ferrous gluconate (FERGON) 324 MG tablet Take 1 tablet (324 mg total) by mouth daily with breakfast. 90 tablet 3  . furosemide (LASIX) 80 MG tablet Take 1 tablet (80 mg total) by mouth 2 (two) times daily. 60 tablet 0  . gabapentin (NEURONTIN) 100 MG capsule Take 100 mg by mouth in the morning, at noon, and at bedtime.    Marland Kitchen gemfibrozil (LOPID) 600 MG tablet Take 1 tablet (600 mg total) by mouth 2 (two) times daily with a meal. 180 tablet 1  . glimepiride (AMARYL) 2 MG tablet Take 1 tablet (2 mg total) by mouth at bedtime. 30 tablet 3  . glucose blood (TRUE METRIX BLOOD GLUCOSE TEST) test strip Use as instructed twice daily diag E11.65 100 each 3  . guaiFENesin (MUCINEX) 600 MG 12 hr tablet Take 1 tablet (600 mg total) by mouth 2 (two) times daily. 14 tablet 0  . hydrALAZINE (APRESOLINE) 100 MG tablet Take 1 tablet (100 mg total) by mouth 3 (three) times daily. 90 tablet 1  . Insulin Glargine, 1 Unit Dial, 300 UNIT/ML SOPN Inject 26 Units into the skin daily.    Marland Kitchen ipratropium-albuterol (DUONEB) 0.5-2.5 (3) MG/3ML SOLN Take 3 mLs by nebulization every 6 (six) hours as needed. 1080 mL 2  . isosorbide mononitrate (IMDUR) 30 MG 24 hr tablet Take 1 tablet (30 mg total) by mouth daily. 30 tablet 1  . loratadine (CLARITIN) 10 MG tablet Take 1 tablet (10 mg total) by mouth daily. (Patient taking differently: Take 10 mg by mouth daily as needed for  allergies. )    . metoprolol succinate (TOPROL-XL) 50 MG 24 hr tablet Take 1 tablet (50 mg total) by mouth at bedtime. Take with or immediately following a meal. 90 tablet 1  . nitroGLYCERIN (NITROSTAT) 0.4 MG SL tablet Place 1 tablet (0.4 mg total) under the tongue every 5 (five) minutes x 3 doses as needed for chest pain. 30 tablet 0  . NOVOFINE 32G X 6 MM MISC To use with [place] injections daily 100 each 4  . omeprazole (PRILOSEC) 40 MG capsule 1 cap daily (Patient taking differently: Take 40 mg by mouth daily. ) 90 capsule 1  . oxyCODONE-acetaminophen (PERCOCET) 5-325 MG tablet Take 1 tablet by mouth every 4 (four) hours as needed for severe pain. 20 tablet 0  . potassium chloride SA (KLOR-CON) 20 MEQ tablet Take 1 tablet (20 mEq total) by mouth 2 (two) times daily. 60 tablet 0  . sodium chloride (OCEAN) 0.65 % SOLN nasal spray Place 1 spray into both nostrils as needed for congestion. 50 mL 0  . tiotropium (  SPIRIVA HANDIHALER) 18 MCG inhalation capsule Place 1 capsule (18 mcg total) into inhaler and inhale daily. 90 capsule 0  . TRUEplus Lancets 33G MISC Use as directed twice daily diag E11.65 100 each 3   No current facility-administered medications for this visit.     Allergies:   Penicillins   Social History:  The patient  reports that he has quit smoking. His smoking use included cigarettes. He has quit using smokeless tobacco.  His smokeless tobacco use included chew. He reports that he does not drink alcohol or use drugs.   Family History:   family history includes Bone cancer in his brother; Cancer in his sister; Diabetes in his mother and sister; Heart disease in his sister; Hypertension in his sister; Lung cancer in his father.    Review of Systems: Review of Systems  Constitutional: Negative.   HENT: Negative.   Respiratory: Negative.   Cardiovascular: Negative.   Gastrointestinal: Negative.   Musculoskeletal: Negative.   Neurological: Negative.   Psychiatric/Behavioral:  Negative.   All other systems reviewed and are negative.    PHYSICAL EXAM: VS:  BP (!) 150/80 (BP Location: Left Arm, Patient Position: Sitting, Cuff Size: Normal)   Pulse 76   Ht _0  (1.803 m)   Wt 256 lb (116.1 kg)   SpO2 96%   BMI 35.70 kg/m  , BMI Body mass index is 35.7 kg/m. Constitutional:  oriented to person, place, and time. No distress.  HENT:  Head: Grossly normal Eyes:  no discharge. No scleral icterus.  Neck: No JVD, no carotid bruits  Cardiovascular: Regular rate and rhythm, no murmurs appreciated Pulmonary/Chest: Clear to auscultation bilaterally, no wheezes or rails Abdominal: Soft.  no distension.  no tenderness.  Musculoskeletal: Normal range of motion Neurological:  normal muscle tone. Coordination normal. No atrophy Skin: Skin warm and dry Psychiatric: normal affect, pleasant   Recent Labs: 05/19/2019: B Natriuretic Peptide 143.0 08/08/2019: ALT 17; Magnesium 2.2 08/09/2019: BUN 59; Creatinine, Ser 2.17; Hemoglobin 9.4; Platelets 278; Potassium 2.8; Sodium 138    Lipid Panel Lab Results  Component Value Date   CHOL 166 03/02/2019   HDL 35 (L) 03/02/2019   LDLCALC 98 03/02/2019   TRIG 166 (H) 03/02/2019      Wt Readings from Last 3 Encounters:  08/23/19 256 lb (116.1 kg)  08/08/19 240 lb (108.9 kg)  08/04/19 240 lb (108.9 kg)       ASSESSMENT AND PLAN:  Problem List Items Addressed This Visit      Cardiology Problems   CHF (congestive heart failure) ([place]) - Primary   Atherosclerotic heart disease of native coronary artery without angina pectoris     Other   Type 2 diabetes mellitus with hyperglycemia (HCC)   Dyspnea on exertion    Other Visit Diagnoses    Chronic obstructive pulmonary disease, unspecified COPD type ([place])         Diastolic CHF, hypertensive heart disease Recommend he continue his current medication regiment Potassium stable Lab work from [facility] reviewed He does have underlying COPD, very deconditioned, recommended  weight loss  Essential hypertension Blood pressure stable at home, elevated on today's visit Recommended continued weight loss  CAD Stressed importance of taking his cholesterol medication, diabetes control Currently with no symptoms of angina. No further workup at this time. Continue current medication regimen.  Type 2 diabetes Managed by primary care  Disposition:   F/U  12 months  Extensive records reviewed, numerous trips to the emergency room from a recent  trip to [facility] Musculoskeletal symptoms, neuropathy in his legs  Total encounter time more than 45 minutes  Greater than 50% was spent in counseling and coordination of care with the patient    Signed, Esmond Plants, M.D., Ph.D. [place], [place]

## 2019-08-22 NOTE — Telephone Encounter (Signed)
Attempted several times to contact.   [place] [phone]

## 2019-08-23 ENCOUNTER — Ambulatory Visit (INDEPENDENT_AMBULATORY_CARE_PROVIDER_SITE_OTHER): Payer: Medicare PPO | Admitting: Cardiovascular Disease

## 2019-08-23 ENCOUNTER — Other Ambulatory Visit: Payer: Self-pay

## 2019-08-23 ENCOUNTER — Encounter: Payer: Self-pay | Admitting: Cardiovascular Disease

## 2019-08-23 ENCOUNTER — Telehealth: Payer: Self-pay | Admitting: Cardiovascular Disease

## 2019-08-23 VITALS — BP 150/80 | HR 76 | Ht 71.0 in | Wt 256.0 lb

## 2019-08-23 DIAGNOSIS — I5032 Chronic diastolic (congestive) heart failure: Secondary | ICD-10-CM | POA: Diagnosis not present

## 2019-08-23 DIAGNOSIS — I2510 Atherosclerotic heart disease of native coronary artery without angina pectoris: Secondary | ICD-10-CM

## 2019-08-23 DIAGNOSIS — J449 Chronic obstructive pulmonary disease, unspecified: Secondary | ICD-10-CM

## 2019-08-23 DIAGNOSIS — E1165 Type 2 diabetes mellitus with hyperglycemia: Secondary | ICD-10-CM

## 2019-08-23 DIAGNOSIS — R0609 Other forms of dyspnea: Secondary | ICD-10-CM

## 2019-08-23 DIAGNOSIS — R0600 Dyspnea, unspecified: Secondary | ICD-10-CM

## 2019-08-23 MED ORDER — ROSUVASTATIN CALCIUM 10 MG PO TABS: 10.0000 mg | ORAL_TABLET | Freq: Every day | ORAL | 3 refills | Status: DC

## 2019-08-23 NOTE — Telephone Encounter (Signed)
Left detailed voicemail message that we were able to see that he was previously on atorvastatin and that Dr. Rockey Situ would like to switch him to Rosuvastatin 10 mg once daily and to please call back if he should have any further questions.

## 2019-08-23 NOTE — Telephone Encounter (Signed)
Spoke with patient and reviewed that provider would like to switch him from atorvastatin to rosuvastatin. He was agreeable with this switch and advised it has been sent into his pharmacy. He was appreciative for the call back with no further questions at this time.

## 2019-08-23 NOTE — Patient Instructions (Signed)

## 2019-08-27 ENCOUNTER — Telehealth (HOSPITAL_COMMUNITY): Payer: Self-pay

## 2019-08-27 DIAGNOSIS — G4733 Obstructive sleep apnea (adult) (pediatric): Secondary | ICD-10-CM | POA: Diagnosis not present

## 2019-08-27 NOTE — Telephone Encounter (Signed)
Attempted to contact, no answer left message.  Will continue to contact.   [place] [phone]

## 2019-08-27 NOTE — Addendum Note (Signed)
Addended by: Raelene Bott, Maily Debarge L on: 08/27/2019 03:29 PM   Modules accepted: Orders

## 2019-08-28 ENCOUNTER — Telehealth: Payer: Self-pay

## 2019-08-28 NOTE — Telephone Encounter (Signed)
LMOM TO CONFIRM AND SCREEN FOR 08-30-19 OV.

## 2019-08-28 NOTE — Telephone Encounter (Signed)
FAXED REQUESTED RECORDS TO [facility].

## 2019-08-30 ENCOUNTER — Ambulatory Visit (INDEPENDENT_AMBULATORY_CARE_PROVIDER_SITE_OTHER): Payer: Medicare PPO | Admitting: Nurse Practitioner

## 2019-08-30 ENCOUNTER — Encounter: Payer: Self-pay | Admitting: Nurse Practitioner

## 2019-08-30 VITALS — Ht 71.0 in | Wt 256.0 lb

## 2019-08-30 DIAGNOSIS — I10 Essential (primary) hypertension: Secondary | ICD-10-CM | POA: Diagnosis not present

## 2019-08-30 DIAGNOSIS — I5189 Other ill-defined heart diseases: Secondary | ICD-10-CM | POA: Diagnosis not present

## 2019-08-30 DIAGNOSIS — E876 Hypokalemia: Secondary | ICD-10-CM

## 2019-08-30 DIAGNOSIS — E1142 Type 2 diabetes mellitus with diabetic polyneuropathy: Secondary | ICD-10-CM | POA: Diagnosis not present

## 2019-08-30 MED ORDER — POTASSIUM CHLORIDE CRYS ER 20 MEQ PO TBCR: 20.0000 meq | EXTENDED_RELEASE_TABLET | Freq: Two times a day (BID) | ORAL | 1 refills | Status: DC

## 2019-08-30 MED ORDER — GABAPENTIN 100 MG PO CAPS: 100.0000 mg | ORAL_CAPSULE | Freq: Three times a day (TID) | ORAL | 1 refills | Status: DC

## 2019-08-30 NOTE — Progress Notes (Signed)
[facility] [place], [place] [address]  Internal MEDICINE  Telephone Visit  Patient Name: Larry Callahan  [id number]  [id number]  Date of Service: 09/07/2019  I connected with the patient at 11:08am by telephone and verified the patients identity using two identifiers.   I discussed the limitations, risks, security and privacy concerns of performing an evaluation and management service by telephone and the availability of in person appointments. I also discussed with the patient that there may be a patient responsible charge related to the service.  The patient expressed understanding and agrees to proceed.    Chief Complaint  Patient presents with  . Telephone Assessment  . Telephone Screen  . Diabetes  . Hyperlipidemia  . Hypertension  . Gastroesophageal Reflux    The patient has been contacted via telephone for follow up visit due to concerns for spread of novel coronavirus.  He presents for follow up visit. He states that he was recently seen in the ER at [facility]. He had fallen. States that his potassium level was low and had developed some diabetic neuropathy. He was started on gabapentin three times daily and potassium orally daily. He states that he is feeling much better. He feels as though his breathing is doing well. He states that when he walks he walks a bit too fast and gets a bit short of breath. After a short rest, he is able to keep going. States that his blood sugars are doing ok. Generally running in the mid 100s       Current Medication: Outpatient Encounter Medications as of 08/30/2019  Medication Sig  . acetaminophen (TYLENOL) 325 MG tablet Take 2 tablets (650 mg total) by mouth every 4 (four) hours as needed for headache or mild pain.  Marland Kitchen albuterol (VENTOLIN HFA) 108 (90 Base) MCG/ACT inhaler Inhale 2 puffs into the lungs every 6 (six) hours as needed for wheezing or shortness of breath.  Marland Kitchen amLODipine (NORVASC) 10 MG tablet Take 1  tablet (10 mg total) by mouth daily.  . Blood Glucose Calibration (TRUE METRIX LEVEL 1) Low SOLN Use as directed  . Blood Glucose Monitoring Suppl (TRUE METRIX AIR GLUCOSE METER) DEVI 1 Device by Does not apply route 2 (two) times daily.  . ferrous gluconate (FERGON) 324 MG tablet Take 1 tablet (324 mg total) by mouth daily with breakfast.  . furosemide (LASIX) 80 MG tablet Take 1 tablet (80 mg total) by mouth 2 (two) times daily.  Marland Kitchen gabapentin (NEURONTIN) 100 MG capsule Take 1 capsule (100 mg total) by mouth in the morning, at noon, and at bedtime.  Marland Kitchen gemfibrozil (LOPID) 600 MG tablet Take 1 tablet (600 mg total) by mouth 2 (two) times daily with a meal.  . glimepiride (AMARYL) 2 MG tablet Take 1 tablet (2 mg total) by mouth at bedtime.  Marland Kitchen glucose blood (TRUE METRIX BLOOD GLUCOSE TEST) test strip Use as instructed twice daily diag E11.65  . guaiFENesin (MUCINEX) 600 MG 12 hr tablet Take 1 tablet (600 mg total) by mouth 2 (two) times daily.  . hydrALAZINE (APRESOLINE) 100 MG tablet Take 1 tablet (100 mg total) by mouth 3 (three) times daily.  . Insulin Glargine, 1 Unit Dial, 300 UNIT/ML SOPN Inject 26 Units into the skin daily.  Marland Kitchen ipratropium-albuterol (DUONEB) 0.5-2.5 (3) MG/3ML SOLN Take 3 mLs by nebulization every 6 (six) hours as needed.  . isosorbide mononitrate (IMDUR) 30 MG 24 hr tablet Take 1 tablet (30 mg total) by mouth daily.  Marland Kitchen  loratadine (CLARITIN) 10 MG tablet Take 1 tablet (10 mg total) by mouth daily. (Patient taking differently: Take 10 mg by mouth daily as needed for allergies. )  . metoprolol succinate (TOPROL-XL) 50 MG 24 hr tablet Take 1 tablet (50 mg total) by mouth at bedtime. Take with or immediately following a meal.  . nitroGLYCERIN (NITROSTAT) 0.4 MG SL tablet Place 1 tablet (0.4 mg total) under the tongue every 5 (five) minutes x 3 doses as needed for chest pain.  Marland Kitchen NOVOFINE 32G X 6 MM MISC To use with [place] injections daily  . omeprazole (PRILOSEC) 40 MG capsule 1 cap  daily (Patient taking differently: Take 40 mg by mouth daily. )  . oxyCODONE-acetaminophen (PERCOCET) 5-325 MG tablet Take 1 tablet by mouth every 4 (four) hours as needed for severe pain.  . potassium chloride SA (KLOR-CON) 20 MEQ tablet Take 1 tablet (20 mEq total) by mouth 2 (two) times daily.  . rosuvastatin (CRESTOR) 10 MG tablet Take 1 tablet (10 mg total) by mouth daily.  . sodium chloride (OCEAN) 0.65 % SOLN nasal spray Place 1 spray into both nostrils as needed for congestion.  Marland Kitchen tiotropium (SPIRIVA HANDIHALER) 18 MCG inhalation capsule Place 1 capsule (18 mcg total) into inhaler and inhale daily.  . TRUEplus Lancets 33G MISC Use as directed twice daily diag E11.65  . [DISCONTINUED] gabapentin (NEURONTIN) 100 MG capsule Take 100 mg by mouth in the morning, at noon, and at bedtime.  . [DISCONTINUED] potassium chloride SA (KLOR-CON) 20 MEQ tablet Take 1 tablet (20 mEq total) by mouth 2 (two) times daily.   No facility-administered encounter medications on file as of 08/30/2019.    Surgical History: Past Surgical History:  Procedure Laterality Date  . BACK SURGERY    . CARDIAC CATHETERIZATION    . CHOLECYSTECTOMY    . COLONOSCOPY WITH PROPOFOL N/A 04/12/2019   Procedure: COLONOSCOPY WITH PROPOFOL;  Surgeon: [name], MD;  Location: [facility] ENDOSCOPY;  Service: Gastroenterology;  Laterality: N/A;  . ESOPHAGOGASTRODUODENOSCOPY N/A 04/12/2019   Procedure: ESOPHAGOGASTRODUODENOSCOPY (EGD);  Surgeon: [name], MD;  Location: [facility] ENDOSCOPY;  Service: Gastroenterology;  Laterality: N/A;  . FLEXIBLE BRONCHOSCOPY Bilateral 05/17/2019   Procedure: FLEXIBLE BRONCHOSCOPY;  Surgeon: [name], MD;  Location: [name] ORS;  Service: Pulmonary;  Laterality: Bilateral;  . left arm surgery    . nephrectomy Left   . PARATHYROIDECTOMY    . RIGHT HEART CATH N/A 03/29/2019   Procedure: RIGHT HEART CATH;  Surgeon: [name], MD;  Location: [place] CV LAB;  Service:  Cardiovascular;  Laterality: N/A;    Medical History: Past Medical History:  Diagnosis Date  . Allergy   . Coronary artery disease   . Diabetes mellitus without complication ([place])   . Diastolic CHF ([place])   . GERD (gastroesophageal reflux disease)   . Hyperlipidemia   . Hypertension   . Renal insufficiency   . Sleep apnea     Family History: Family History  Problem Relation Age of Onset  . Diabetes Mother   . Lung cancer Father   . Diabetes Sister   . Hypertension Sister   . Heart disease Sister   . Cancer Sister   . Bone cancer Brother     Social History   Socioeconomic History  . Marital status: Single    Spouse name: Not on file  . Number of children: Not on file  . Years of education: Not on file  . Highest education level: Not on  file  Occupational History  . Not on file  Tobacco Use  . Smoking status: Former Smoker    Types: Cigarettes  . Smokeless tobacco: Former Systems developer    Types: Chew  Substance and Sexual Activity  . Alcohol use: No  . Drug use: No  . Sexual activity: Not on file  Other Topics Concern  . Not on file  Social History Narrative  . Not on file   Social Determinants of Health   Financial Resource Strain:   . Difficulty of Paying Living Expenses:   Food Insecurity:   . Worried About Charity fundraiser in the Last Year:   . Arboriculturist in the Last Year:   Transportation Needs:   . Film/video editor (Medical):   Marland Kitchen Lack of Transportation (Non-Medical):   Physical Activity:   . Days of Exercise per Week:   . Minutes of Exercise per Session:   Stress:   . Feeling of Stress :   Social Connections:   . Frequency of Communication with Friends and Family:   . Frequency of Social Gatherings with Friends and Family:   . Attends Religious Services:   . Active Member of Clubs or Organizations:   . Attends Archivist Meetings:   Marland Kitchen Marital Status:   Intimate Partner Violence:   . Fear of Current or Ex-Partner:   .  Emotionally Abused:   Marland Kitchen Physically Abused:   . Sexually Abused:       Review of Systems  Constitutional: Negative for activity change, chills, fatigue and unexpected weight change.  HENT: Negative for congestion, postnasal drip, rhinorrhea, sneezing and sore throat.   Respiratory: Positive for shortness of breath and wheezing. Negative for cough and chest tightness.        Worse with exertion.  Cardiovascular: Negative for chest pain and palpitations.  Gastrointestinal: Negative for abdominal pain, constipation, diarrhea, nausea and vomiting.  Endocrine: Negative for cold intolerance, heat intolerance, polydipsia and polyuria.       Blood sugars generally in the 100s.  Musculoskeletal: Positive for arthralgias. Negative for back pain, joint swelling and neck pain.  Skin: Negative for rash.  Allergic/Immunologic: Negative for environmental allergies.  Neurological: Positive for numbness. Negative for tremors.       Burning type sensation in both feet. Reports improvement since starting on gabapentin.  Hematological: Negative for adenopathy. Does not bruise/bleed easily.  Psychiatric/Behavioral: Negative for behavioral problems (Depression), sleep disturbance and suicidal ideas. The patient is not nervous/anxious.     Today's Vitals   08/30/19 1052  Weight: 256 lb (116.1 kg)  Height: _0  (1.803 m)   Body mass index is 35.7 kg/m.  Observation/Objective:  The patient is alert and oriented. He is pleasant and answering all questions appropriately. Breathing is non-labored. He is in no acute distress.    Assessment/Plan: 1. Diabetic polyneuropathy associated with type 2 diabetes mellitus ([place]) Will continue gabapentin dosing started in ER. May take one capsule three times daily. continue diabetic medication as prescribed. Check HgbA1c at next, in office visit.  - gabapentin (NEURONTIN) 100 MG capsule; Take 1 capsule (100 mg total) by mouth in the morning, at noon, and at  bedtime.  Dispense: 90 capsule; Refill: 1  2. Hypokalemia Continue Klor-Con as prescribed in ER. Check BMP for surveillance.  - potassium chloride SA (KLOR-CON) 20 MEQ tablet; Take 1 tablet (20 mEq total) by mouth 2 (two) times daily.  Dispense: 60 tablet; Refill: 1  3. Essential hypertension Stable.  Continue bp medication as prescribed   4. Diastolic dysfunction Continue regular visits with cardiology and pulmonology as scheduled.   General Counseling: Larry Callahan understanding of the findings of today's phone visit and agrees with plan of treatment. I have discussed any further diagnostic evaluation that may be needed or ordered today. We also reviewed his medications today. he has been encouraged to call the office with any questions or concerns that should arise related to todays visit.  This patient was seen by Caraway with Dr Lavera Guise as a part of collaborative care agreement  Meds ordered this encounter  Medications  . gabapentin (NEURONTIN) 100 MG capsule    Sig: Take 1 capsule (100 mg total) by mouth in the morning, at noon, and at bedtime.    Dispense:  90 capsule    Refill:  1    Order Specific Question:   Supervising Provider    Answer:   Lavera Guise [[id number]]  . potassium chloride SA (KLOR-CON) 20 MEQ tablet    Sig: Take 1 tablet (20 mEq total) by mouth 2 (two) times daily.    Dispense:  60 tablet    Refill:  1    Order Specific Question:   Supervising Provider    Answer:   Lavera Guise [[id number]]    Time spent: 63 Minutes    Dr Lavera Guise Internal medicine

## 2019-09-09 ENCOUNTER — Telehealth (HOSPITAL_COMMUNITY): Payer: Self-pay

## 2019-09-09 NOTE — Telephone Encounter (Signed)
Larry Callahan states he is doing ok.  He is aware of up coming appts.  He has all his medications.  He states would like more physical therapy at home, advised to ask his Primary doctor, they would have to order it.  He stays active walking to his sister's home 2 doors down.  He states trying watch his diet and sugars.  He denies any chest pain, headaches or dizziness.  He states appetite has been good.  He has not been compliant with weighing.  Will continue to work on education for heart failure and diet.   [place] [phone]

## 2019-09-24 ENCOUNTER — Telehealth: Payer: Self-pay

## 2019-09-24 ENCOUNTER — Emergency Department
Admission: EM | Admit: 2019-09-24 | Discharge: 2019-09-24 | Disposition: A | Discharge status: Home / Self Care | Payer: Medicare PPO | Attending: Emergency Medicine | Admitting: Emergency Medicine

## 2019-09-24 ENCOUNTER — Encounter: Payer: Self-pay | Admitting: Emergency Medicine

## 2019-09-24 ENCOUNTER — Other Ambulatory Visit: Payer: Self-pay

## 2019-09-24 DIAGNOSIS — W57XXXA Bitten or stung by nonvenomous insect and other nonvenomous arthropods, initial encounter: Secondary | ICD-10-CM

## 2019-09-24 DIAGNOSIS — E119 Type 2 diabetes mellitus without complications: Secondary | ICD-10-CM | POA: Insufficient documentation

## 2019-09-24 DIAGNOSIS — F17220 Nicotine dependence, chewing tobacco, uncomplicated: Secondary | ICD-10-CM | POA: Insufficient documentation

## 2019-09-24 DIAGNOSIS — Y92007 Garden or yard of unspecified non-institutional (private) residence as the place of occurrence of the external cause: Secondary | ICD-10-CM | POA: Insufficient documentation

## 2019-09-24 DIAGNOSIS — S6992XA Unspecified injury of left wrist, hand and finger(s), initial encounter: Secondary | ICD-10-CM | POA: Diagnosis present

## 2019-09-24 DIAGNOSIS — I5033 Acute on chronic diastolic (congestive) heart failure: Secondary | ICD-10-CM | POA: Insufficient documentation

## 2019-09-24 DIAGNOSIS — S60562A Insect bite (nonvenomous) of left hand, initial encounter: Secondary | ICD-10-CM | POA: Insufficient documentation

## 2019-09-24 DIAGNOSIS — S60469A Insect bite (nonvenomous) of unspecified finger, initial encounter: Secondary | ICD-10-CM

## 2019-09-24 DIAGNOSIS — I2510 Atherosclerotic heart disease of native coronary artery without angina pectoris: Secondary | ICD-10-CM | POA: Insufficient documentation

## 2019-09-24 DIAGNOSIS — Y999 Unspecified external cause status: Secondary | ICD-10-CM | POA: Insufficient documentation

## 2019-09-24 DIAGNOSIS — R52 Pain, unspecified: Secondary | ICD-10-CM | POA: Diagnosis not present

## 2019-09-24 DIAGNOSIS — I10 Essential (primary) hypertension: Secondary | ICD-10-CM | POA: Diagnosis not present

## 2019-09-24 DIAGNOSIS — Z79899 Other long term (current) drug therapy: Secondary | ICD-10-CM | POA: Diagnosis not present

## 2019-09-24 DIAGNOSIS — Z794 Long term (current) use of insulin: Secondary | ICD-10-CM | POA: Insufficient documentation

## 2019-09-24 DIAGNOSIS — Y93H2 Activity, gardening and landscaping: Secondary | ICD-10-CM | POA: Diagnosis not present

## 2019-09-24 DIAGNOSIS — R5381 Other malaise: Secondary | ICD-10-CM | POA: Diagnosis not present

## 2019-09-24 MED ORDER — TRAMADOL HCL 50 MG PO TABS: 50.0000 mg | ORAL_TABLET | Freq: Two times a day (BID) | ORAL | 0 refills | Status: AC | PRN

## 2019-09-24 MED ORDER — HYDROXYZINE HCL 50 MG PO TABS
50.0000 mg | ORAL_TABLET | Freq: Once | ORAL | Status: AC
  Administered 2019-09-24: 50 mg via ORAL
  Filled 2019-09-24: qty 1

## 2019-09-24 MED ORDER — SULFAMETHOXAZOLE-TRIMETHOPRIM 800-160 MG PO TABS: 1.0000 | ORAL_TABLET | Freq: Two times a day (BID) | ORAL | 0 refills | Status: DC

## 2019-09-24 MED ORDER — NAPROXEN 500 MG PO TABS: 500.0000 mg | ORAL_TABLET | Freq: Two times a day (BID) | ORAL | Status: DC

## 2019-09-24 MED ORDER — TRAMADOL HCL 50 MG PO TABS
50.0000 mg | ORAL_TABLET | Freq: Once | ORAL | Status: AC
  Administered 2019-09-24: 13:00:00 50 mg via ORAL
  Filled 2019-09-24: qty 1

## 2019-09-24 MED ORDER — SULFAMETHOXAZOLE-TRIMETHOPRIM 800-160 MG PO TABS
1.0000 | ORAL_TABLET | Freq: Once | ORAL | Status: AC
  Administered 2019-09-24: 13:00:00 1 via ORAL
  Filled 2019-09-24: qty 1

## 2019-09-24 MED ORDER — HYDROXYZINE HCL 10 MG/5ML PO SYRP: 10.0000 mg | ORAL_SOLUTION | Freq: Three times a day (TID) | ORAL | 0 refills | Status: DC | PRN

## 2019-09-24 MED ORDER — NAPROXEN 500 MG PO TABS
500.0000 mg | ORAL_TABLET | Freq: Once | ORAL | Status: AC
  Administered 2019-09-24: 13:00:00 500 mg via ORAL
  Filled 2019-09-24: qty 1

## 2019-09-24 NOTE — Discharge Instructions (Addendum)
Follow discharge care instruction and take medication as directed.

## 2019-09-24 NOTE — ED Triage Notes (Signed)
Reports got bit by a spider yesterday and tried soaking it in ice and putting heat on it but it is hurting and red with slight swelling.

## 2019-09-24 NOTE — ED Notes (Signed)
See triage note  Presents with possible spider bite to left hand  States he noticed it yesterday  States he has been nauseated since

## 2019-09-24 NOTE — Telephone Encounter (Signed)
Confirmed appointment on 09/26/2019. klh

## 2019-09-24 NOTE — ED Triage Notes (Signed)
Pt comes into the ED via EMS from home with c/o being bit by a spider on his left hand yesterday and is c/o having numbness/tingling to that hand

## 2019-09-24 NOTE — ED Provider Notes (Signed)
[facility] Emergency Department Provider Note   ____________________________________________   First MD Initiated Contact with Patient 09/24/19 1300     (approximate)  I have reviewed the triage vital signs and the nursing notes.   HISTORY  Chief Complaint Insect Bite and Hand Pain    HPI Larry Callahan is a 64 y.o. male patient complain of left hand pain secondary to insect bite yesterday.  Patient states he was doing yard work when he felt a sting which she suspected might of been a spider.  Patient states this morning he has had numbness and tingling in the left hand.  Patient states the hand is slightly red and mild swelling.  Patient rates pain as a 6/10.  No palliative measure for complaint.         Past Medical History:  Diagnosis Date  . Allergy   . Coronary artery disease   . Diabetes mellitus without complication ([place])   . Diastolic CHF ([place])   . GERD (gastroesophageal reflux disease)   . Hyperlipidemia   . Hypertension   . Renal insufficiency   . Sleep apnea     Patient Active Problem List   Diagnosis Date Noted  . Left-sided weakness 08/08/2019  . BMI 35.0-35.9,adult 08/08/2019  . Acute right hip pain 06/17/2019  . Inability to ambulate due to hip 06/17/2019  . CKD stage 3 due to type 2 diabetes mellitus ([place])   . Hypervolemia   . Acute on chronic diastolic CHF (congestive heart failure) ([place])   . Lobar pneumonia ([place])   . Type 2 diabetes mellitus with stage 3b chronic kidney disease, with long-term current use of insulin ([place])   . Hypokalemia   . Hypomagnesemia   . Full code status 05/20/2019  . Pleuritic chest pain   . CAP (community acquired pneumonia) 05/19/2019  . AKI (acute kidney injury) ([place]) 05/09/2019  . Hemoptysis 04/09/2019  . Shortness of breath 03/26/2019  . Uncontrolled type 2 diabetes mellitus with insulin therapy ([place]) 03/12/2019  . Acute on chronic heart failure with preserved ejection fraction  (HFpEF) ([place])   . Acute kidney injury superimposed on chronic kidney disease ([place])   . CHF (congestive heart failure) ([place]) 03/01/2019  . Diastolic dysfunction 75/64/3329  . Iron deficiency anemia 12/31/2018  . Atopic dermatitis 11/24/2018  . Screening for colon cancer 11/06/2017  . Uncontrolled type 2 diabetes mellitus with hyperglycemia ([place]) 11/06/2017  . Dermatitis, unspecified 06/09/2017  . Atherosclerotic heart disease of native coronary artery without angina pectoris 06/09/2017  . 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 Obstructive sleep apnea, adult 06/09/2017  . Morbid (severe) obesity due to excess calories ([place]) 06/09/2017  . Cigarette nicotine dependence with nicotine-induced disorder 06/09/2017  . Diabetic polyneuropathy associated with type 2 diabetes mellitus ([place]) 06/09/2017  . Allergic rhinitis due to pollen 06/09/2017  . Mixed hyperlipidemia 06/09/2017  . COPD with acute exacerbation ([place]) 06/09/2017  . Dyspnea on exertion 06/09/2017  . Wheezing 06/09/2017  . Dysuria 06/09/2017  . Snoring 06/09/2017  . Essential hypertension 06/09/2017  . Personal history of other malignant neoplasm of kidney 06/09/2017  . Pain in right hip 06/09/2017  . Impacted cerumen, bilateral 06/09/2017  . Secondary hyperparathyroidism, not elsewhere classified ([place]) 06/09/2017  . Type 2 diabetes mellitus with hyperglycemia ([place]) 06/09/2017  . Tinea corporis 06/09/2017    Past Surgical History:  Procedure Laterality Date  . BACK SURGERY    . CARDIAC CATHETERIZATION    . CHOLECYSTECTOMY    .  COLONOSCOPY WITH PROPOFOL N/A 04/12/2019   Procedure: COLONOSCOPY WITH PROPOFOL;  Surgeon: [name], MD;  Location: [facility] ENDOSCOPY;  Service: Gastroenterology;  Laterality: N/A;  . ESOPHAGOGASTRODUODENOSCOPY N/A 04/12/2019   Procedure: ESOPHAGOGASTRODUODENOSCOPY (EGD);  Surgeon: [name], MD;  Location: [facility] ENDOSCOPY;  Service: Gastroenterology;   Laterality: N/A;  . FLEXIBLE BRONCHOSCOPY Bilateral 05/17/2019   Procedure: FLEXIBLE BRONCHOSCOPY;  Surgeon: [name], MD;  Location: [name] ORS;  Service: Pulmonary;  Laterality: Bilateral;  . left arm surgery    . nephrectomy Left   . PARATHYROIDECTOMY    . RIGHT HEART CATH N/A 03/29/2019   Procedure: RIGHT HEART CATH;  Surgeon: [name], MD;  Location: [facility] CV LAB;  Service: Cardiovascular;  Laterality: N/A;    Prior to Admission medications   Medication Sig Start Date End Date Taking? Authorizing Provider  acetaminophen (TYLENOL) 325 MG tablet Take 2 tablets (650 mg total) by mouth every 4 (four) hours as needed for headache or mild pain. 03/05/19   Gouru, Illene Silver, MD  albuterol (VENTOLIN HFA) 108 (90 Base) MCG/ACT inhaler Inhale 2 puffs into the lungs every 6 (six) hours as needed for wheezing or shortness of breath. 03/05/19   Gouru, Illene Silver, MD  amLODipine (NORVASC) 10 MG tablet Take 1 tablet (10 mg total) by mouth daily. 08/01/19   [name], NP  Blood Glucose Calibration (TRUE METRIX LEVEL 1) Low SOLN Use as directed 04/04/18   [name], NP  Blood Glucose Monitoring Suppl (TRUE METRIX AIR GLUCOSE METER) DEVI 1 Device by Does not apply route 2 (two) times daily. 04/13/18   [name], NP  ferrous gluconate (FERGON) 324 MG tablet Take 1 tablet (324 mg total) by mouth daily with breakfast. 08/01/19   [name], NP  furosemide (LASIX) 80 MG tablet Take 1 tablet (80 mg total) by mouth 2 (two) times daily. 08/01/19   [name], NP  gabapentin (NEURONTIN) 100 MG capsule Take 1 capsule (100 mg total) by mouth in the morning, at noon, and at bedtime. 08/30/19   [name], NP  gemfibrozil (LOPID) 600 MG tablet Take 1 tablet (600 mg total) by mouth 2 (two) times daily with a meal. 06/27/19   Boscia, Greer Ee, NP  glimepiride (AMARYL) 2 MG tablet Take 1 tablet (2 mg total) by mouth at bedtime. 08/02/19   Lavera Guise, MD  glucose blood  (TRUE METRIX BLOOD GLUCOSE TEST) test strip Use as instructed twice daily diag E11.65 07/30/19   [name], NP  guaiFENesin (MUCINEX) 600 MG 12 hr tablet Take 1 tablet (600 mg total) by mouth 2 (two) times daily. 06/18/19   Lavina Hamman, MD  hydrALAZINE (APRESOLINE) 100 MG tablet Take 1 tablet (100 mg total) by mouth 3 (three) times daily. 08/02/19   Lavera Guise, MD  hydrOXYzine (ATARAX) 10 MG/5ML syrup Take 5 mLs (10 mg total) by mouth 3 (three) times daily as needed for itching. 09/24/19   Sable Feil, PA-C  Insulin Glargine, 1 Unit Dial, 300 UNIT/ML SOPN Inject 26 Units into the skin daily.    [provider]  ipratropium-albuterol (DUONEB) 0.5-2.5 (3) MG/3ML SOLN Take 3 mLs by nebulization every 6 (six) hours as needed. 03/08/19   Kendell Bane, NP  isosorbide mononitrate (IMDUR) 30 MG 24 hr tablet Take 1 tablet (30 mg total) by mouth daily. 08/02/19   [name], NP  loratadine (CLARITIN) 10 MG tablet Take 1 tablet (10  mg total) by mouth daily. Patient taking differently: Take 10 mg by mouth daily as needed for allergies.  04/01/19   Nicholes Mango, MD  metoprolol succinate (TOPROL-XL) 50 MG 24 hr tablet Take 1 tablet (50 mg total) by mouth at bedtime. Take with or immediately following a meal. 07/10/19   Lavera Guise, MD  naproxen (NAPROSYN) 500 MG tablet Take 1 tablet (500 mg total) by mouth 2 (two) times daily with a meal. 09/24/19   Sable Feil, PA-C  nitroGLYCERIN (NITROSTAT) 0.4 MG SL tablet Place 1 tablet (0.4 mg total) under the tongue every 5 (five) minutes x 3 doses as needed for chest pain. 05/24/19   Loletha Grayer, MD  NOVOFINE 32G X 6 MM MISC To use with [place] injections daily 12/13/18   [name], NP  omeprazole (PRILOSEC) 40 MG capsule 1 cap daily Patient taking differently: Take 40 mg by mouth daily.  07/10/19   Lavera Guise, MD  potassium chloride SA (KLOR-CON) 20 MEQ tablet Take 1 tablet (20 mEq total) by mouth 2 (two) times daily. 08/30/19    [name], NP  rosuvastatin (CRESTOR) 10 MG tablet Take 1 tablet (10 mg total) by mouth daily. 08/23/19 11/21/19  [name], MD  sodium chloride (OCEAN) 0.65 % SOLN nasal spray Place 1 spray into both nostrils as needed for congestion. 06/18/19   Lavina Hamman, MD  sulfamethoxazole-trimethoprim (BACTRIM DS) 800-160 MG tablet Take 1 tablet by mouth 2 (two) times daily. 09/24/19   Sable Feil, PA-C  tiotropium (SPIRIVA HANDIHALER) 18 MCG inhalation capsule Place 1 capsule (18 mcg total) into inhaler and inhale daily. 08/07/19 11/05/19  [name], NP  traMADol (ULTRAM) 50 MG tablet Take 1 tablet (50 mg total) by mouth every 12 (twelve) hours as needed for up to 5 days. 09/24/19 09/29/19  Sable Feil, PA-C  TRUEplus Lancets 33G MISC Use as directed twice daily diag E11.65 07/30/19   [name], NP    Allergies Penicillins  Family History  Problem Relation Age of Onset  . Diabetes Mother   . Lung cancer Father   . Diabetes Sister   . Hypertension Sister   . Heart disease Sister   . Cancer Sister   . Bone cancer Brother     Social History Social History   Tobacco Use  . Smoking status: Former Smoker    Types: Cigarettes  . Smokeless tobacco: Former Systems developer    Types: Chew  Substance Use Topics  . Alcohol use: No  . Drug use: No    Review of Systems  Constitutional: No fever/chills Eyes: No visual changes. ENT: No sore throat. Cardiovascular: Denies chest pain. Respiratory: Denies shortness of breath. Gastrointestinal: No abdominal pain.  No nausea, no vomiting.  No diarrhea.  No constipation. Genitourinary: Negative for dysuria. Musculoskeletal: Negative for back pain. Skin: Negative for rash. Neurological: Negative for headaches, focal weakness or numbness. Endocrine: Diabetes, hyperlipidemia, hypertension. Allergic/Immunilogical: Penicillin. ____________________________________________   PHYSICAL EXAM:  VITAL SIGNS: ED Triage Vitals  Enc  Vitals Group     BP 09/24/19 1254 (!) 145/68     Pulse Rate 09/24/19 1254 85     Resp 09/24/19 1254 18     Temp 09/24/19 1254 97.7 F (36.5 C)     Temp Source 09/24/19 1254 Oral     SpO2 09/24/19 1254 98 %     Weight 09/24/19 1249 256 lb (116.1 kg)     Height 09/24/19 1249 5'  11" (1.803 m)     Head Circumference --      Peak Flow --      Pain Score 09/24/19 1249 6     Pain Loc --      Pain Edu? --      Excl. in [place]? --     Constitutional: Alert and oriented. Well appearing and in no acute distress. Cardiovascular: Normal rate, regular rhythm. Grossly normal heart sounds.  Good peripheral circulation. Respiratory: Normal respiratory effort.  No retractions. Lungs CTAB. Musculoskeletal: No lower extremity tenderness nor edema.  No joint effusions. Neurologic:  Normal speech and language. No gross focal neurologic deficits are appreciated. No gait instability. Skin: Edema and erythema to dorsal aspect of right wrist.  Psychiatric: Mood and affect are normal. Speech and behavior are normal.  ____________________________________________   LABS (all labs ordered are listed, but only abnormal results are displayed)  Labs Reviewed - No data to display ____________________________________________  EKG   ____________________________________________  RADIOLOGY  ED MD interpretation:    Official radiology report(s): No results found.  ____________________________________________   PROCEDURES  Procedure(s) performed (including Critical Care):  Procedures   ____________________________________________   INITIAL IMPRESSION / ASSESSMENT AND PLAN / ED COURSE  As part of my medical decision making, I reviewed the following data within the [facility] [place]     Patient presents with redness, swelling, and pain to the dorsal aspect of the left wrist secondary to insect bite yesterday.  Patient denies any anaphylactic signs symptoms.  Physical exam is consistent with  localized reaction to insect bite.  Patient given discharge care instructions and advised take medication as directed.  Follow-up with PCP.  Larry Callahan was evaluated in Emergency Department on 09/24/2019 for the symptoms described in the history of present illness. He was evaluated in the context of the global COVID-19 pandemic, which necessitated consideration that the patient might be at risk for infection with the SARS-CoV-2 virus that causes COVID-19. Institutional protocols and algorithms that pertain to the evaluation of patients at risk for COVID-19 are in a state of rapid change based on information released by regulatory bodies including the CDC and federal and state organizations. These policies and algorithms were followed during the patient's care in the ED.       ____________________________________________   FINAL CLINICAL IMPRESSION(S) / ED DIAGNOSES  Final diagnoses:  Insect bite of finger of left hand, initial encounter     ED Discharge Orders         Ordered    sulfamethoxazole-trimethoprim (BACTRIM DS) 800-160 MG tablet  2 times daily     09/24/19 1318    naproxen (NAPROSYN) 500 MG tablet  2 times daily with meals     09/24/19 1318    hydrOXYzine (ATARAX) 10 MG/5ML syrup  3 times daily PRN     09/24/19 1318    traMADol (ULTRAM) 50 MG tablet  Every 12 hours PRN     09/24/19 1318           Note:  This document was prepared using Dragon voice recognition software and may include unintentional dictation errors.    Sable Feil, PA-C 09/24/19 1319    Delman Kitten, MD 10/08/19 Shelah Lewandowsky

## 2019-09-25 ENCOUNTER — Ambulatory Visit: Payer: Medicare PPO | Admitting: Adult Health

## 2019-09-26 ENCOUNTER — Other Ambulatory Visit: Payer: Self-pay

## 2019-09-26 ENCOUNTER — Ambulatory Visit (INDEPENDENT_AMBULATORY_CARE_PROVIDER_SITE_OTHER): Payer: Medicare PPO | Admitting: Nurse Practitioner

## 2019-09-26 ENCOUNTER — Encounter: Payer: Self-pay | Admitting: Nurse Practitioner

## 2019-09-26 ENCOUNTER — Telehealth (HOSPITAL_COMMUNITY): Payer: Self-pay

## 2019-09-26 VITALS — BP 169/86 | HR 58 | Temp 97.4°F | Resp 16 | Ht 71.0 in | Wt 261.8 lb

## 2019-09-26 DIAGNOSIS — E1121 Type 2 diabetes mellitus with diabetic nephropathy: Secondary | ICD-10-CM

## 2019-09-26 DIAGNOSIS — Z794 Long term (current) use of insulin: Secondary | ICD-10-CM

## 2019-09-26 DIAGNOSIS — I10 Essential (primary) hypertension: Secondary | ICD-10-CM

## 2019-09-26 DIAGNOSIS — N1832 Chronic kidney disease, stage 3b: Secondary | ICD-10-CM | POA: Diagnosis not present

## 2019-09-26 DIAGNOSIS — E1122 Type 2 diabetes mellitus with diabetic chronic kidney disease: Secondary | ICD-10-CM

## 2019-09-26 DIAGNOSIS — L03114 Cellulitis of left upper limb: Secondary | ICD-10-CM | POA: Diagnosis not present

## 2019-09-26 MED ORDER — SULFAMETHOXAZOLE-TRIMETHOPRIM 800-160 MG PO TABS: 1.0000 | ORAL_TABLET | Freq: Two times a day (BID) | ORAL | 0 refills | Status: DC

## 2019-09-26 NOTE — Telephone Encounter (Signed)
Attempted to contact, no answer and left message.  Will continue to try to contact.   [place] [phone]

## 2019-09-26 NOTE — Progress Notes (Signed)
[facility] [place], [place] [address]  Internal MEDICINE  Office Visit Note  Patient Name: Larry Callahan  [id number]  [id number]  Date of Service: 09/26/2019   Pt is here for a sick visit.  Chief Complaint  Patient presents with  . Hospitalization Follow-up    spider bite     The patient is here for sick visit. He is complaining of spider bite which he believes he experienced on Monday. He was seen in the ER for this on 09/24/2019. He was given prescriptions for antibiotics, naproxen, Hydroxyzine, and tramadol.  The only prescription he picked up was for naproxen and he states that he doesn't need this one. He states that he is experiencing a burning across the top of his hand. There is mild swelling present and he states this is getting better.        Current Medication:  Outpatient Encounter Medications as of 09/26/2019  Medication Sig  . acetaminophen (TYLENOL) 325 MG tablet Take 2 tablets (650 mg total) by mouth every 4 (four) hours as needed for headache or mild pain.  Marland Kitchen albuterol (VENTOLIN HFA) 108 (90 Base) MCG/ACT inhaler Inhale 2 puffs into the lungs every 6 (six) hours as needed for wheezing or shortness of breath.  Marland Kitchen amLODipine (NORVASC) 10 MG tablet Take 1 tablet (10 mg total) by mouth daily.  . Blood Glucose Calibration (TRUE METRIX LEVEL 1) Low SOLN Use as directed  . Blood Glucose Monitoring Suppl (TRUE METRIX AIR GLUCOSE METER) DEVI 1 Device by Does not apply route 2 (two) times daily.  . ferrous gluconate (FERGON) 324 MG tablet Take 1 tablet (324 mg total) by mouth daily with breakfast.  . furosemide (LASIX) 80 MG tablet Take 1 tablet (80 mg total) by mouth 2 (two) times daily.  Marland Kitchen gabapentin (NEURONTIN) 100 MG capsule Take 1 capsule (100 mg total) by mouth in the morning, at noon, and at bedtime.  Marland Kitchen gemfibrozil (LOPID) 600 MG tablet Take 1 tablet (600 mg total) by mouth 2 (two) times daily with a meal.  . glimepiride (AMARYL)  2 MG tablet Take 1 tablet (2 mg total) by mouth at bedtime.  Marland Kitchen glucose blood (TRUE METRIX BLOOD GLUCOSE TEST) test strip Use as instructed twice daily diag E11.65  . guaiFENesin (MUCINEX) 600 MG 12 hr tablet Take 1 tablet (600 mg total) by mouth 2 (two) times daily.  . hydrALAZINE (APRESOLINE) 100 MG tablet Take 1 tablet (100 mg total) by mouth 3 (three) times daily.  . hydrOXYzine (ATARAX) 10 MG/5ML syrup Take 5 mLs (10 mg total) by mouth 3 (three) times daily as needed for itching.  . Insulin Glargine, 1 Unit Dial, 300 UNIT/ML SOPN Inject 26 Units into the skin daily.  Marland Kitchen ipratropium-albuterol (DUONEB) 0.5-2.5 (3) MG/3ML SOLN Take 3 mLs by nebulization every 6 (six) hours as needed.  . isosorbide mononitrate (IMDUR) 30 MG 24 hr tablet Take 1 tablet (30 mg total) by mouth daily.  Marland Kitchen loratadine (CLARITIN) 10 MG tablet Take 1 tablet (10 mg total) by mouth daily. (Patient taking differently: Take 10 mg by mouth daily as needed for allergies. )  . metoprolol succinate (TOPROL-XL) 50 MG 24 hr tablet Take 1 tablet (50 mg total) by mouth at bedtime. Take with or immediately following a meal.  . naproxen (NAPROSYN) 500 MG tablet Take 1 tablet (500 mg total) by mouth 2 (two) times daily with a meal.  . nitroGLYCERIN (NITROSTAT) 0.4 MG SL tablet Place 1 tablet (0.4  mg total) under the tongue every 5 (five) minutes x 3 doses as needed for chest pain.  Marland Kitchen NOVOFINE 32G X 6 MM MISC To use with [place] injections daily  . omeprazole (PRILOSEC) 40 MG capsule 1 cap daily (Patient taking differently: Take 40 mg by mouth daily. )  . potassium chloride SA (KLOR-CON) 20 MEQ tablet Take 1 tablet (20 mEq total) by mouth 2 (two) times daily.  . rosuvastatin (CRESTOR) 10 MG tablet Take 1 tablet (10 mg total) by mouth daily.  . sodium chloride (OCEAN) 0.65 % SOLN nasal spray Place 1 spray into both nostrils as needed for congestion.  . sulfamethoxazole-trimethoprim (BACTRIM DS) 800-160 MG tablet Take 1 tablet by mouth 2 (two) times  daily.  Marland Kitchen tiotropium (SPIRIVA HANDIHALER) 18 MCG inhalation capsule Place 1 capsule (18 mcg total) into inhaler and inhale daily.  . traMADol (ULTRAM) 50 MG tablet Take 1 tablet (50 mg total) by mouth every 12 (twelve) hours as needed for up to 5 days.  . TRUEplus Lancets 33G MISC Use as directed twice daily diag E11.65  . [DISCONTINUED] sulfamethoxazole-trimethoprim (BACTRIM DS) 800-160 MG tablet Take 1 tablet by mouth 2 (two) times daily.   No facility-administered encounter medications on file as of 09/26/2019.      Medical History: Past Medical History:  Diagnosis Date  . Allergy   . Coronary artery disease   . Diabetes mellitus without complication ([place])   . Diastolic CHF ([place])   . GERD (gastroesophageal reflux disease)   . Hyperlipidemia   . Hypertension   . Renal insufficiency   . Sleep apnea      Today's Vitals   09/26/19 0830  BP: (!) 169/86  Pulse: (!) 58  Resp: 16  Temp: (!) 97.4 F (36.3 C)  SpO2: 96%  Weight: 261 lb 12.8 oz (118.8 kg)   Body mass index is 36.51 kg/m.  Review of Systems  Constitutional: Negative for chills, fatigue and unexpected weight change.  HENT: Negative for congestion, postnasal drip, rhinorrhea, sneezing and sore throat.   Respiratory: Negative for cough, chest tightness and shortness of breath.   Cardiovascular: Negative for chest pain and palpitations.       Elevated blood pressure today.   Gastrointestinal: Negative for abdominal pain, constipation, diarrhea, nausea and vomiting.  Musculoskeletal: Positive for arthralgias. Negative for back pain, joint swelling and neck pain.       Burning and mild swelling across the top of his left hand. States this is getting better.   Skin: Negative for rash.       Some swelling across the back of the hand which is causing pain. Believes he may have been bitten by spider or some insect. Thinks it happened on Monday.   Allergic/Immunologic: Negative for environmental allergies.   Neurological: Negative for dizziness, tremors, numbness and headaches.  Hematological: Negative for adenopathy. Does not bruise/bleed easily.  Psychiatric/Behavioral: Negative for behavioral problems (Depression), sleep disturbance and suicidal ideas. The patient is not nervous/anxious.     Physical Exam Vitals and nursing note reviewed.  Constitutional:      General: He is not in acute distress.    Appearance: Normal appearance. He is well-developed. He is not diaphoretic.  HENT:     Head: Normocephalic and atraumatic.     Mouth/Throat:     Pharynx: No oropharyngeal exudate.  Eyes:     Pupils: Pupils are equal, round, and reactive to light.  Neck:     Thyroid: No thyromegaly.  Vascular: No JVD.     Trachea: No tracheal deviation.  Cardiovascular:     Rate and Rhythm: Normal rate and regular rhythm.     Heart sounds: Normal heart sounds. No murmur. No friction rub. No gallop.   Pulmonary:     Effort: Pulmonary effort is normal. No respiratory distress.     Breath sounds: No wheezing or rales.     Comments: Congested sounding, non productive cough present which is baseline for patient.  Chest:     Chest wall: No tenderness.  Abdominal:     Palpations: Abdomen is soft.  Musculoskeletal:        General: Normal range of motion.     Cervical back: Normal range of motion and neck supple.  Lymphadenopathy:     Cervical: No cervical adenopathy.  Skin:    General: Skin is warm and dry.     Findings: Erythema present.     Comments: There is some erythema and warmth across the back surface of his hand. This is mild. There is no evidence of insect bite or acute infection.   Neurological:     Mental Status: He is alert and oriented to person, place, and time. Mental status is at baseline.     Cranial Nerves: No cranial nerve deficit.  Psychiatric:        Behavior: Behavior normal.        Thought Content: Thought content normal.        Judgment: Judgment normal.     Assessment/Plan: 1. Cellulitis of left hand Recommend he start antibiotics initially prescribed by ER. This is bactrim DS bid for 7 days. May take naproxen twice daily if needed for pain/inflammation.  - sulfamethoxazole-trimethoprim (BACTRIM DS) 800-160 MG tablet; Take 1 tablet by mouth 2 (two) times daily.  Dispense: 14 tablet; Refill: 0  2. Type 2 diabetes mellitus with stage 3b chronic kidney disease, with long-term current use of insulin ([place]) Continue diabetic medication as prescribed.   3. Essential hypertension Continue blood pressure medication as prescribed   General Counseling: Pattricia Boss understanding of the findings of todays visit and agrees with plan of treatment. I have discussed any further diagnostic evaluation that may be needed or ordered today. We also reviewed his medications today. he has been encouraged to call the office with any questions or concerns that should arise related to todays visit.    Counseling:  This patient was seen by Dante with Dr Lavera Guise as a part of collaborative care agreement  Meds ordered this encounter  Medications  . sulfamethoxazole-trimethoprim (BACTRIM DS) 800-160 MG tablet    Sig: Take 1 tablet by mouth 2 (two) times daily.    Dispense:  14 tablet    Refill:  0    Prescription initially sent from ER. Please go ahead and fill this for patient. Thanks.    Order Specific Question:   Supervising Provider    Answer:   Lavera Guise [[id number]]    Time spent: 20 Minutes

## 2019-09-28 DIAGNOSIS — S60562A Insect bite (nonvenomous) of left hand, initial encounter: Secondary | ICD-10-CM | POA: Diagnosis not present

## 2019-10-09 ENCOUNTER — Encounter: Payer: Self-pay | Admitting: *Deleted

## 2019-10-16 ENCOUNTER — Other Ambulatory Visit: Payer: Self-pay | Admitting: Internal Medicine

## 2019-10-16 MED ORDER — FUROSEMIDE 80 MG PO TABS: 80.0000 mg | ORAL_TABLET | Freq: Two times a day (BID) | ORAL | 3 refills | Status: DC

## 2019-10-21 ENCOUNTER — Telehealth (HOSPITAL_COMMUNITY): Payer: Self-pay

## 2019-10-21 NOTE — Telephone Encounter (Signed)
Attempted to contact.  Left message.   Jamarie Joplin Cole EMT-Paramedic [phone] 

## 2019-10-22 ENCOUNTER — Other Ambulatory Visit: Payer: Self-pay

## 2019-10-24 ENCOUNTER — Other Ambulatory Visit: Payer: Self-pay

## 2019-10-24 MED ORDER — INSULIN GLARGINE (1 UNIT DIAL) 300 UNIT/ML ~~LOC~~ SOPN: 26.0000 [IU] | PEN_INJECTOR | Freq: Every day | SUBCUTANEOUS | 3 refills | Status: DC

## 2019-10-29 ENCOUNTER — Telehealth: Payer: Self-pay

## 2019-10-29 NOTE — Telephone Encounter (Signed)
Lmom to confirm and screen for 10-31-19 ov.

## 2019-10-31 ENCOUNTER — Ambulatory Visit: Payer: Medicare PPO | Admitting: Nurse Practitioner

## 2019-11-05 ENCOUNTER — Telehealth: Payer: Self-pay

## 2019-11-05 ENCOUNTER — Telehealth (HOSPITAL_COMMUNITY): Payer: Self-pay

## 2019-11-05 NOTE — Telephone Encounter (Signed)
Attempted to contact, no answer left message.  ° °Daniell Paradise °Mesic EMT-Paramedic °[phone] °

## 2019-11-05 NOTE — Telephone Encounter (Signed)
Confirmed appointment on 11/07/2019 and screened for covid. klh 

## 2019-11-05 NOTE — Telephone Encounter (Signed)
Faheem contacted me back.  He says he has been doing good.  He has a garden and mowing the yard.  He is right now cooking out on the grill 2 steaks.  He is aware of appts.  He has not been in the hospital for heart failure.  He states has all his medications and aware of how to take them.  He is aware and has my number if he has any problems.  Will continue to visit for heart failure.   [place] [phone]

## 2019-11-07 ENCOUNTER — Ambulatory Visit: Payer: Medicare PPO | Admitting: Internal Medicine

## 2019-11-10 ENCOUNTER — Other Ambulatory Visit: Payer: Self-pay

## 2019-11-10 ENCOUNTER — Emergency Department
Admission: EM | Admit: 2019-11-10 | Discharge: 2019-11-10 | Disposition: A | Discharge status: Home / Self Care | Payer: Medicare PPO | Attending: Emergency Medicine | Admitting: Emergency Medicine

## 2019-11-10 ENCOUNTER — Emergency Department: Payer: Medicare PPO

## 2019-11-10 DIAGNOSIS — Z79899 Other long term (current) drug therapy: Secondary | ICD-10-CM | POA: Insufficient documentation

## 2019-11-10 DIAGNOSIS — I2510 Atherosclerotic heart disease of native coronary artery without angina pectoris: Secondary | ICD-10-CM | POA: Insufficient documentation

## 2019-11-10 DIAGNOSIS — R58 Hemorrhage, not elsewhere classified: Secondary | ICD-10-CM | POA: Diagnosis not present

## 2019-11-10 DIAGNOSIS — I5032 Chronic diastolic (congestive) heart failure: Secondary | ICD-10-CM | POA: Insufficient documentation

## 2019-11-10 DIAGNOSIS — Z794 Long term (current) use of insulin: Secondary | ICD-10-CM | POA: Diagnosis not present

## 2019-11-10 DIAGNOSIS — Z85528 Personal history of other malignant neoplasm of kidney: Secondary | ICD-10-CM | POA: Insufficient documentation

## 2019-11-10 DIAGNOSIS — D441 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: Secondary | ICD-10-CM | POA: Diagnosis not present

## 2019-11-10 DIAGNOSIS — G4489 Other headache syndrome: Secondary | ICD-10-CM | POA: Diagnosis not present

## 2019-11-10 DIAGNOSIS — N1832 Chronic kidney disease, stage 3b: Secondary | ICD-10-CM | POA: Diagnosis not present

## 2019-11-10 DIAGNOSIS — Y92002 Bathroom of unspecified non-institutional (private) residence single-family (private) house as the place of occurrence of the external cause: Secondary | ICD-10-CM | POA: Diagnosis not present

## 2019-11-10 DIAGNOSIS — S80862A Insect bite (nonvenomous), left lower leg, initial encounter: Secondary | ICD-10-CM | POA: Insufficient documentation

## 2019-11-10 DIAGNOSIS — Y999 Unspecified external cause status: Secondary | ICD-10-CM | POA: Insufficient documentation

## 2019-11-10 DIAGNOSIS — R0789 Other chest pain: Secondary | ICD-10-CM | POA: Insufficient documentation

## 2019-11-10 DIAGNOSIS — I130 Hypertensive heart and chronic kidney disease with heart failure and stage 1 through stage 4 chronic kidney disease, or unspecified chronic kidney disease: Secondary | ICD-10-CM | POA: Diagnosis not present

## 2019-11-10 DIAGNOSIS — E1122 Type 2 diabetes mellitus with diabetic chronic kidney disease: Secondary | ICD-10-CM | POA: Insufficient documentation

## 2019-11-10 DIAGNOSIS — R531 Weakness: Secondary | ICD-10-CM | POA: Diagnosis not present

## 2019-11-10 DIAGNOSIS — R110 Nausea: Secondary | ICD-10-CM | POA: Diagnosis not present

## 2019-11-10 DIAGNOSIS — W57XXXA Bitten or stung by nonvenomous insect and other nonvenomous arthropods, initial encounter: Secondary | ICD-10-CM

## 2019-11-10 DIAGNOSIS — Y93E1 Activity, personal bathing and showering: Secondary | ICD-10-CM | POA: Insufficient documentation

## 2019-11-10 DIAGNOSIS — R079 Chest pain, unspecified: Secondary | ICD-10-CM

## 2019-11-10 DIAGNOSIS — Z87891 Personal history of nicotine dependence: Secondary | ICD-10-CM | POA: Insufficient documentation

## 2019-11-10 DIAGNOSIS — J441 Chronic obstructive pulmonary disease with (acute) exacerbation: Secondary | ICD-10-CM

## 2019-11-10 DIAGNOSIS — T63484A Toxic effect of venom of other arthropod, undetermined, initial encounter: Secondary | ICD-10-CM | POA: Diagnosis not present

## 2019-11-10 DIAGNOSIS — I10 Essential (primary) hypertension: Secondary | ICD-10-CM | POA: Diagnosis not present

## 2019-11-10 DIAGNOSIS — S299XXA Unspecified injury of thorax, initial encounter: Secondary | ICD-10-CM | POA: Diagnosis not present

## 2019-11-10 LAB — CBC WITH DIFFERENTIAL/PLATELET
Abs Immature Granulocytes: 0.05 10*3/uL (ref 0.00–0.07)
Basophils Absolute: 0.1 10*3/uL (ref 0.0–0.1)
Basophils Relative: 1 %
Eosinophils Absolute: 0.3 10*3/uL (ref 0.0–0.5)
Eosinophils Relative: 3 %
HCT: 37.8 % — ABNORMAL LOW (ref 39.0–52.0)
Hemoglobin: 12.6 g/dL — ABNORMAL LOW (ref 13.0–17.0)
Immature Granulocytes: 1 %
Lymphocytes Relative: 19 %
Lymphs Abs: 1.9 10*3/uL (ref 0.7–4.0)
MCH: 28.7 pg (ref 26.0–34.0)
MCHC: 33.3 g/dL (ref 30.0–36.0)
MCV: 86.1 fL (ref 80.0–100.0)
Monocytes Absolute: 0.6 10*3/uL (ref 0.1–1.0)
Monocytes Relative: 6 %
Neutro Abs: 7 10*3/uL (ref 1.7–7.7)
Neutrophils Relative %: 70 %
Platelets: 230 10*3/uL (ref 150–400)
RBC: 4.39 MIL/uL (ref 4.22–5.81)
RDW: 14 % (ref 11.5–15.5)
WBC: 9.8 10*3/uL (ref 4.0–10.5)
nRBC: 0 % (ref 0.0–0.2)

## 2019-11-10 LAB — COMPREHENSIVE METABOLIC PANEL
ALT: 18 U/L (ref 0–44)
AST: 20 U/L (ref 15–41)
Albumin: 3.8 g/dL (ref 3.5–5.0)
Alkaline Phosphatase: 94 U/L (ref 38–126)
Anion gap: 12 (ref 5–15)
BUN: 75 mg/dL — ABNORMAL HIGH (ref 8–23)
CO2: 23 mmol/L (ref 22–32)
Calcium: 9.5 mg/dL (ref 8.9–10.3)
Chloride: 104 mmol/L (ref 98–111)
Creatinine, Ser: 2.15 mg/dL — ABNORMAL HIGH (ref 0.61–1.24)
GFR calc Af Amer: 36 mL/min — ABNORMAL LOW (ref >60)
GFR calc non Af Amer: 31 mL/min — ABNORMAL LOW (ref >60)
Glucose, Bld: 175 mg/dL — ABNORMAL HIGH (ref 70–99)
Potassium: 3.7 mmol/L (ref 3.5–5.1)
Sodium: 139 mmol/L (ref 135–145)
Total Bilirubin: 0.6 mg/dL (ref 0.3–1.2)
Total Protein: 8 g/dL (ref 6.5–8.1)

## 2019-11-10 LAB — TROPONIN I (HIGH SENSITIVITY)
Troponin I (High Sensitivity): 11 ng/L (ref <18)
Troponin I (High Sensitivity): 11 ng/L (ref <18)

## 2019-11-10 LAB — BRAIN NATRIURETIC PEPTIDE: B Natriuretic Peptide: 23.1 pg/mL (ref 0.0–100.0)

## 2019-11-10 MED ORDER — DEXAMETHASONE SODIUM PHOSPHATE 10 MG/ML IJ SOLN
10.0000 mg | Freq: Once | INTRAMUSCULAR | Status: AC
  Administered 2019-11-10: 10 mg via INTRAVENOUS
  Filled 2019-11-10: qty 1

## 2019-11-10 MED ORDER — PREDNISONE 50 MG PO TABS
50.0000 mg | ORAL_TABLET | Freq: Every day | ORAL | 0 refills | Status: DC
Start: 2019-11-10 — End: 2019-11-22

## 2019-11-10 NOTE — ED Provider Notes (Signed)
[facility] Emergency Department Provider Note  ____________________________________________  Time seen: Approximately 6:46 PM  I have reviewed the triage vital signs and the nursing notes.   HISTORY  Chief Complaint tick bite    HPI Larry Callahan is a 64 y.o. male who presents the emergency department via EMS for complaint of chest pain.  Patient states that while showering today he did notice that he had a tick that he removed off of his left lower leg.  Patient states that he also began to experience chest pain and called EMS.  Patient states that when he got here "all he wanted to talk about was my tick."  Patient states that "I am not worried about my tick bite, I am worried about my chest pain."  Patient denies any history of STEMI.  Patient denies any palpitations at this time.  He has a history of coronary artery disease, diabetes, CHF, hypertension, sleep apnea and renal insufficiency.  Patient has not taken any medications for his chest pain.  No shortness of breath or cough at this time.  No URI symptoms. Patient denies any radiation of his chest pain. No abdominal complaints.  Patient states that he noticed the tick today, could not of been on his body for greater than 24 hours.  He denies any erythema, edema or abnormal rash around the tick bite.           Past Medical History:  Diagnosis Date  . Allergy   . Coronary artery disease   . Diabetes mellitus without complication ([place])   . Diastolic CHF ([place])   . GERD (gastroesophageal reflux disease)   . Hyperlipidemia   . Hypertension   . Renal insufficiency   . Sleep apnea     Patient Active Problem List   Diagnosis Date Noted  . Cellulitis of left hand 09/26/2019  . Left-sided weakness 08/08/2019  . BMI 35.0-35.9,adult 08/08/2019  . Acute right hip pain 06/17/2019  . Inability to ambulate due to hip 06/17/2019  . CKD stage 3 due to type 2 diabetes mellitus ([place])   . Hypervolemia    . Acute on chronic diastolic CHF (congestive heart failure) ([place])   . Lobar pneumonia ([place])   . Type 2 diabetes mellitus with stage 3b chronic kidney disease, with long-term current use of insulin ([place])   . Hypokalemia   . Hypomagnesemia   . Full code status 05/20/2019  . Pleuritic chest pain   . CAP (community acquired pneumonia) 05/19/2019  . AKI (acute kidney injury) ([place]) 05/09/2019  . Hemoptysis 04/09/2019  . Shortness of breath 03/26/2019  . Uncontrolled type 2 diabetes mellitus with insulin therapy ([place]) 03/12/2019  . Acute on chronic heart failure with preserved ejection fraction (HFpEF) ([place])   . Acute kidney injury superimposed on chronic kidney disease ([place])   . CHF (congestive heart failure) ([place]) 03/01/2019  . Diastolic dysfunction 29/93/7169  . Iron deficiency anemia 12/31/2018  . Atopic dermatitis 11/24/2018  . Screening for colon cancer 11/06/2017  . Uncontrolled type 2 diabetes mellitus with hyperglycemia ([place]) 11/06/2017  . Dermatitis, unspecified 06/09/2017  . Atherosclerotic heart disease of native coronary artery without angina pectoris 06/09/2017  . 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 Obstructive sleep apnea, adult 06/09/2017  . Morbid (severe) obesity due to excess calories ([place]) 06/09/2017  . Cigarette nicotine dependence with nicotine-induced disorder 06/09/2017  . Diabetic polyneuropathy associated with type 2 diabetes mellitus ([place]) 06/09/2017  . Allergic rhinitis due  to pollen 06/09/2017  . Mixed hyperlipidemia 06/09/2017  . COPD with acute exacerbation ([place]) 06/09/2017  . Dyspnea on exertion 06/09/2017  . Wheezing 06/09/2017  . Dysuria 06/09/2017  . Snoring 06/09/2017  . Essential hypertension 06/09/2017  . Personal history of other malignant neoplasm of kidney 06/09/2017  . Pain in right hip 06/09/2017  . Impacted cerumen, bilateral 06/09/2017  . Secondary hyperparathyroidism, not elsewhere classified ([place])  06/09/2017  . Type 2 diabetes mellitus with hyperglycemia ([place]) 06/09/2017  . Tinea corporis 06/09/2017    Past Surgical History:  Procedure Laterality Date  . BACK SURGERY    . CARDIAC CATHETERIZATION    . CHOLECYSTECTOMY    . COLONOSCOPY WITH PROPOFOL N/A 04/12/2019   Procedure: COLONOSCOPY WITH PROPOFOL;  Surgeon: [name], MD;  Location[facility] ENDOSCOPY;  Service: Gastroenterology;  Laterality: N/A;  . ESOPHAGOGASTRODUODENOSCOPY N/A 04/12/2019   Procedure: ESOPHAGOGASTRODUODENOSCOPY (EGD);  Surgeon: [name], MD;  Location: [facility] ENDOSCOPY;  Service: Gastroenterology;  Laterality: N/A;  . FLEXIBLE BRONCHOSCOPY Bilateral 05/17/2019   Procedure: FLEXIBLE BRONCHOSCOPY;  Surgeon: [name], MD;  Location: [name] ORS;  Service: Pulmonary;  Laterality: Bilateral;  . left arm surgery    . nephrectomy Left   . PARATHYROIDECTOMY    . RIGHT HEART CATH N/A 03/29/2019   Procedure: RIGHT HEART CATH;  Surgeon: [name], MD;  Location: [facility] CV LAB;  Service: Cardiovascular;  Laterality: N/A;    Prior to Admission medications   Medication Sig Start Date End Date Taking? Authorizing Provider  acetaminophen (TYLENOL) 325 MG tablet Take 2 tablets (650 mg total) by mouth every 4 (four) hours as needed for headache or mild pain. 03/05/19   Gouru, Illene Silver, MD  albuterol (VENTOLIN HFA) 108 (90 Base) MCG/ACT inhaler Inhale 2 puffs into the lungs every 6 (six) hours as needed for wheezing or shortness of breath. 03/05/19   Gouru, Illene Silver, MD  amLODipine (NORVASC) 10 MG tablet Take 1 tablet (10 mg total) by mouth daily. 08/01/19   [name], NP  Blood Glucose Calibration (TRUE METRIX LEVEL 1) Low SOLN Use as directed 04/04/18   [name], NP  Blood Glucose Monitoring Suppl (TRUE METRIX AIR GLUCOSE METER) DEVI 1 Device by Does not apply route 2 (two) times daily. 04/13/18   [name], NP  ferrous gluconate (FERGON) 324 MG tablet Take 1 tablet (324 mg  total) by mouth daily with breakfast. 08/01/19   [name], NP  furosemide (LASIX) 80 MG tablet Take 1 tablet (80 mg total) by mouth 2 (two) times daily. 10/16/19   [name], NP  gabapentin (NEURONTIN) 100 MG capsule Take 1 capsule (100 mg total) by mouth in the morning, at noon, and at bedtime. 08/30/19   [name], NP  gemfibrozil (LOPID) 600 MG tablet Take 1 tablet (600 mg total) by mouth 2 (two) times daily with a meal. 06/27/19   Boscia, Greer Ee, NP  glimepiride (AMARYL) 2 MG tablet Take 1 tablet (2 mg total) by mouth at bedtime. 08/02/19   Lavera Guise, MD  glucose blood (TRUE METRIX BLOOD GLUCOSE TEST) test strip Use as instructed twice daily diag E11.65 07/30/19   [name], NP  guaiFENesin (MUCINEX) 600 MG 12 hr tablet Take 1 tablet (600 mg total) by mouth 2 (two) times daily. 06/18/19   Lavina Hamman, MD  hydrALAZINE (APRESOLINE) 100 MG tablet TAKE 1 TABLET THREE TIMES DAILY 10/16/19   [name], NP  hydrOXYzine (ATARAX) 10 MG/5ML syrup Take 5 mLs (10 mg total) by mouth 3 (three) times daily as needed for itching. 09/24/19   Sable Feil, PA-C  insulin glargine, 1 Unit Dial, (TOUJEO) 300 UNIT/ML Solostar Pen Inject 26 Units into the skin daily. 10/24/19   [name], NP  ipratropium-albuterol (DUONEB) 0.5-2.5 (3) MG/3ML SOLN Take 3 mLs by nebulization every 6 (six) hours as needed. 03/08/19   Kendell Bane, NP  isosorbide mononitrate (IMDUR) 30 MG 24 hr tablet Take 1 tablet (30 mg total) by mouth daily. 08/02/19   [name], NP  loratadine (CLARITIN) 10 MG tablet Take 1 tablet (10 mg total) by mouth daily. Patient taking differently: Take 10 mg by mouth daily as needed for allergies.  04/01/19   Nicholes Mango, MD  metoprolol succinate (TOPROL-XL) 50 MG 24 hr tablet Take 1 tablet (50 mg total) by mouth at bedtime. Take with or immediately following a meal. 07/10/19   Lavera Guise, MD  naproxen (NAPROSYN) 500 MG tablet Take 1 tablet (500 mg  total) by mouth 2 (two) times daily with a meal. 09/24/19   Sable Feil, PA-C  nitroGLYCERIN (NITROSTAT) 0.4 MG SL tablet Place 1 tablet (0.4 mg total) under the tongue every 5 (five) minutes x 3 doses as needed for chest pain. 05/24/19   Loletha Grayer, MD  NOVOFINE 32G X 6 MM MISC To use with [place] injections daily 12/13/18   [name], NP  omeprazole (PRILOSEC) 40 MG capsule 1 cap daily Patient taking differently: Take 40 mg by mouth daily.  07/10/19   Lavera Guise, MD  potassium chloride SA (KLOR-CON) 20 MEQ tablet Take 1 tablet (20 mEq total) by mouth 2 (two) times daily. 08/30/19   [name], NP  predniSONE (DELTASONE) 50 MG tablet Take 1 tablet (50 mg total) by mouth daily with breakfast. 11/10/19   Tekeyah Santiago, Charline Bills, PA-C  rosuvastatin (CRESTOR) 10 MG tablet Take 1 tablet (10 mg total) by mouth daily. 08/23/19 11/21/19  [name], MD  sodium chloride (OCEAN) 0.65 % SOLN nasal spray Place 1 spray into both nostrils as needed for congestion. 06/18/19   Lavina Hamman, MD  sulfamethoxazole-trimethoprim (BACTRIM DS) 800-160 MG tablet Take 1 tablet by mouth 2 (two) times daily. 09/26/19   [name], NP  tiotropium (SPIRIVA HANDIHALER) 18 MCG inhalation capsule Place 1 capsule (18 mcg total) into inhaler and inhale daily. 08/07/19 11/05/19  [name], NP  TRUEplus Lancets 33G MISC Use as directed twice daily diag E11.65 07/30/19   [name], NP    Allergies Penicillins  Family History  Problem Relation Age of Onset  . Diabetes Mother   . Lung cancer Father   . Diabetes Sister   . Hypertension Sister   . Heart disease Sister   . Cancer Sister   . Bone cancer Brother     Social History Social History   Tobacco Use  . Smoking status: Former Smoker    Types: Cigarettes  . Smokeless tobacco: Former Systems developer    Types: Chew  Substance Use Topics  . Alcohol use: No  . Drug use: No     Review of Systems  Constitutional: No fever/chills Eyes:  No visual changes. No discharge ENT: No upper respiratory complaints. Cardiovascular: Positive for substernal chest pain Respiratory: no cough. No SOB. Gastrointestinal: No abdominal pain.  No nausea, no vomiting.  No diarrhea.  No constipation. Genitourinary: Negative for dysuria. No  hematuria Musculoskeletal: Negative for musculoskeletal pain. Skin: Negative for rash, abrasions, lacerations, ecchymosis.  Tick bite to the left leg. Neurological: Negative for headaches, focal weakness or numbness. 10-point ROS otherwise negative.  ____________________________________________   PHYSICAL EXAM:  VITAL SIGNS: ED Triage Vitals  Enc Vitals Group     BP 11/10/19 1605 (!) 181/81     Pulse Rate 11/10/19 1605 69     Resp 11/10/19 1605 18     Temp 11/10/19 1605 97.7 F (36.5 C)     Temp Source 11/10/19 1605 Oral     SpO2 11/10/19 1605 97 %     Weight 11/10/19 1606 250 lb (113.4 kg)     Height 11/10/19 1606 _0  (1.803 m)     Head Circumference --      Peak Flow --      Pain Score --      Pain Loc --      Pain Edu? --      Excl. in [place]? --      Constitutional: Alert and oriented. Well appearing and in no acute distress. Eyes: Conjunctivae are normal. PERRL. EOMI. Head: Atraumatic. ENT:      Ears:       Nose: No congestion/rhinnorhea.      Mouth/Throat: Mucous membranes are moist.  Neck: No stridor.    Cardiovascular: Normal rate, regular rhythm. Normal S1 and S2.  No murmurs, rubs, gallops.  Good peripheral circulation. Respiratory: Normal respiratory effort without tachypnea or retractions. Lungs CTAB. Good air entry to the bases with no decreased or absent breath sounds. Gastrointestinal: Bowel sounds 4 quadrants. Soft and nontender to palpation. No guarding or rigidity. No palpable masses. No distention. No CVA tenderness. Musculoskeletal: Full range of motion to all extremities. No gross deformities appreciated. Neurologic:  Normal speech and language. No gross focal  neurologic deficits are appreciated.  Skin:  Skin is warm, dry and intact. No rash noted.  Visualization of the left leg reveals small excoriation from removal of tick bite.  No surrounding erythema, edema.  No rashes identified. Psychiatric: Mood and affect are normal. Speech and behavior are normal. Patient exhibits appropriate insight and judgement.   ____________________________________________   LABS (all labs ordered are listed, but only abnormal results are displayed)  Labs Reviewed  COMPREHENSIVE METABOLIC PANEL - Abnormal; Notable for the following components:      Result Value   Glucose, Bld 175 (*)    BUN 75 (*)    Creatinine, Ser 2.15 (*)    GFR calc non Af Amer 31 (*)    GFR calc Af Amer 36 (*)    All other components within normal limits  CBC WITH DIFFERENTIAL/PLATELET - Abnormal; Notable for the following components:   Hemoglobin 12.6 (*)    HCT 37.8 (*)    All other components within normal limits  BRAIN NATRIURETIC PEPTIDE  TROPONIN I (HIGH SENSITIVITY)  TROPONIN I (HIGH SENSITIVITY)   ____________________________________________  EKG   ____________________________________________  RADIOLOGY I personally viewed and evaluated these images as part of my medical decision making, as well as reviewing the written report by the radiologist.  I concur with radiologist finding of no acute cardiopulmonary abnormality specifically no pulmonary edema, effusion concerning for pneumonia.  DG Chest 2 View  Result Date: 11/10/2019 CLINICAL DATA:  Pt to room in wheelchair, states he found a tick on his left leg at 3 pm, removed it and the site was bleeding. He reports a weak right leg, was able to stand  to remove pants for examination. Small sore noted on left thigh. EXAM: CHEST - 2 VIEW COMPARISON:  Chest radiograph 08/08/2019, 08/04/2019 FINDINGS: The heart size and mediastinal contours are within normal limits. Chronic coarse bilateral interstitial markings. No new focal  consolidation. No pneumothorax or pleural effusion. No acute finding in the visualized skeleton. IMPRESSION: No acute cardiopulmonary findings. Electronically Signed   By: Audie Pinto M.D.   On: 11/10/2019 19:21    ____________________________________________    PROCEDURES  Procedure(s) performed:    Procedures    Medications  dexamethasone (DECADRON) injection 10 mg (has no administration in time range)     ____________________________________________   INITIAL IMPRESSION / ASSESSMENT AND PLAN / ED COURSE  Pertinent labs & imaging results that were available during my care of the patient were reviewed by me and considered in my medical decision making (see chart for details).  Review of the [place] CSRS was performed in accordance of the [place] prior to dispensing any controlled drugs.  Clinical Course as of Nov 10 2251  Sun Nov 10, 2019  1907 Patient presented to the emergency department complaining of tick bite and chest pain.  Patient states that he called EMS for chest pain but did notice a tick on him earlier.  Patient states that "once he got here all they wanted to do was talk about my tick bite."  Patient states that he is not worried about his tick bite he is mostly concerned about his chest pain.  Patient reports substernal chest pain with no radiation.  No shortness of breath or cough.  No URI symptoms.  History of coronary artery disease, CHF but no history of MI.  No medications for other complaint prior to arrival.   [JC]    Clinical Course User Index [JC] Leauna Sharber, Charline Bills, PA-C          Patient's diagnosis is consistent with nonspecific chest pain, COPD, tick bite.  Patient presented to emergency department with complaints of chest tightness, some shortness of breath, and a tick bite.  Patient presented via EMS for these complaints.  Initially patient states that triage only wanted to discuss his tick bite.  On my presentation to the room, patient states  that he is more concerned with his chest tightness and shortness of breath.  Visualization of tick bite revealed excoriation from removal.  Tick bite had been less than 24 hours on the patient.  Low concern at this time for tickborne illness.  Patient has a history of COPD, CHF, CAD.  Overall exam was reassuring with no adventitious lung sounds.  No evidence of increased peripheral edema.  Patient is still urinating from his diuretics.  Work-up is reassuring with no acute evidence of infection.  Troponins are reassuring, EKG is reassuring.  At this time I will provide the patient with steroids for likely COPD exacerbation causing patient's chest tightness and shortness of breath.  Again there was no adventitious lung sounds requiring breathing treatments.  Patient is stable at this time for discharge.  No indication for further work-up.  Differential included ACS/STEMI, CHF, COPD, pneumonia, bronchitis, PE, tickborne illness, COVID-19.Marland Kitchen Patient will be discharged home with prescriptions for prednisone. Patient is to follow up with primary care as needed or otherwise directed. Patient is given ED precautions to return to the ED for any worsening or new symptoms.     ____________________________________________  FINAL CLINICAL IMPRESSION(S) / ED DIAGNOSES  Final diagnoses:  Nonspecific chest pain  Chronic obstructive pulmonary disease with acute  exacerbation ([place])  Tick bite, initial encounter      NEW MEDICATIONS STARTED DURING THIS VISIT:  ED Discharge Orders         Ordered    predniSONE (DELTASONE) 50 MG tablet  Daily with breakfast     11/10/19 2250              This chart was dictated using voice recognition software/Dragon. Despite best efforts to proofread, errors can occur which can change the meaning. Any change was purely unintentional.    Darletta Moll, PA-C 11/10/19 2253    Earleen Newport, MD 11/10/19 2308

## 2019-11-10 NOTE — ED Triage Notes (Signed)
Pt comes EMS after pulling a tick off his left leg this morning. Pt states the spot he pulled it off from doesn't have feeling. The rest of his leg does. Small amount of redness around bite.

## 2019-11-10 NOTE — ED Notes (Signed)
Pt to room in wheelchair, states he found a tick on his left leg at 3 pm, removed it and the site was bleeding.  He reports a weak right leg, was able to stand to remove pants for examination.  Small sore noted on left thigh.

## 2019-11-10 NOTE — ED Notes (Signed)
Reviewed discharge instructions, follow-up care, and prescriptions with patient. Patient verbalized understanding of all information reviewed. Patient stable, with no distress noted at this time.    

## 2019-11-15 ENCOUNTER — Telehealth: Payer: Self-pay

## 2019-11-15 NOTE — Telephone Encounter (Signed)
Patient called with slight chest pain and trouble breathing, I made sure he did his nebulizers and inhalers and he states still has chest tightness, he is unable to come to an appointment so I advised him he would need to get evaluated and call ems and be seen with ER department and call us so we can get him seen for follow up. Beth

## 2019-11-19 ENCOUNTER — Emergency Department: Payer: Medicare PPO

## 2019-11-19 ENCOUNTER — Observation Stay
Admission: EM | Admit: 2019-11-19 | Discharge: 2019-11-21 | Disposition: A | Discharge status: Home / Self Care | Payer: Medicare PPO | Attending: Hospitalist | Admitting: Hospitalist

## 2019-11-19 ENCOUNTER — Other Ambulatory Visit: Payer: Self-pay

## 2019-11-19 DIAGNOSIS — Z87891 Personal history of nicotine dependence: Secondary | ICD-10-CM | POA: Diagnosis not present

## 2019-11-19 DIAGNOSIS — Z20822 Contact with and (suspected) exposure to covid-19: Secondary | ICD-10-CM | POA: Diagnosis not present

## 2019-11-19 DIAGNOSIS — J939 Pneumothorax, unspecified: Secondary | ICD-10-CM | POA: Diagnosis not present

## 2019-11-19 DIAGNOSIS — Z791 Long term (current) use of non-steroidal anti-inflammatories (NSAID): Secondary | ICD-10-CM | POA: Diagnosis not present

## 2019-11-19 DIAGNOSIS — K219 Gastro-esophageal reflux disease without esophagitis: Secondary | ICD-10-CM | POA: Diagnosis not present

## 2019-11-19 DIAGNOSIS — J449 Chronic obstructive pulmonary disease, unspecified: Secondary | ICD-10-CM | POA: Insufficient documentation

## 2019-11-19 DIAGNOSIS — R0902 Hypoxemia: Secondary | ICD-10-CM | POA: Diagnosis not present

## 2019-11-19 DIAGNOSIS — N1832 Chronic kidney disease, stage 3b: Secondary | ICD-10-CM | POA: Insufficient documentation

## 2019-11-19 DIAGNOSIS — I5030 Unspecified diastolic (congestive) heart failure: Secondary | ICD-10-CM | POA: Diagnosis not present

## 2019-11-19 DIAGNOSIS — I130 Hypertensive heart and chronic kidney disease with heart failure and stage 1 through stage 4 chronic kidney disease, or unspecified chronic kidney disease: Secondary | ICD-10-CM | POA: Diagnosis not present

## 2019-11-19 DIAGNOSIS — Z79899 Other long term (current) drug therapy: Secondary | ICD-10-CM | POA: Insufficient documentation

## 2019-11-19 DIAGNOSIS — Z7952 Long term (current) use of systemic steroids: Secondary | ICD-10-CM | POA: Diagnosis not present

## 2019-11-19 DIAGNOSIS — R079 Chest pain, unspecified: Secondary | ICD-10-CM | POA: Insufficient documentation

## 2019-11-19 DIAGNOSIS — E1122 Type 2 diabetes mellitus with diabetic chronic kidney disease: Secondary | ICD-10-CM | POA: Diagnosis not present

## 2019-11-19 DIAGNOSIS — I2510 Atherosclerotic heart disease of native coronary artery without angina pectoris: Secondary | ICD-10-CM | POA: Diagnosis not present

## 2019-11-19 DIAGNOSIS — E1140 Type 2 diabetes mellitus with diabetic neuropathy, unspecified: Secondary | ICD-10-CM | POA: Diagnosis not present

## 2019-11-19 DIAGNOSIS — G4733 Obstructive sleep apnea (adult) (pediatric): Secondary | ICD-10-CM | POA: Diagnosis not present

## 2019-11-19 DIAGNOSIS — J984 Other disorders of lung: Secondary | ICD-10-CM | POA: Diagnosis not present

## 2019-11-19 DIAGNOSIS — R110 Nausea: Secondary | ICD-10-CM | POA: Diagnosis not present

## 2019-11-19 DIAGNOSIS — Z794 Long term (current) use of insulin: Secondary | ICD-10-CM | POA: Insufficient documentation

## 2019-11-19 DIAGNOSIS — R0789 Other chest pain: Secondary | ICD-10-CM | POA: Diagnosis not present

## 2019-11-19 DIAGNOSIS — R1110 Vomiting, unspecified: Secondary | ICD-10-CM | POA: Diagnosis not present

## 2019-11-19 DIAGNOSIS — K920 Hematemesis: Secondary | ICD-10-CM | POA: Diagnosis not present

## 2019-11-19 DIAGNOSIS — D509 Iron deficiency anemia, unspecified: Secondary | ICD-10-CM | POA: Insufficient documentation

## 2019-11-19 DIAGNOSIS — R042 Hemoptysis: Secondary | ICD-10-CM | POA: Diagnosis not present

## 2019-11-19 LAB — CBC
HCT: 33.6 % — ABNORMAL LOW (ref 39.0–52.0)
Hemoglobin: 11.2 g/dL — ABNORMAL LOW (ref 13.0–17.0)
MCH: 29.2 pg (ref 26.0–34.0)
MCHC: 33.3 g/dL (ref 30.0–36.0)
MCV: 87.5 fL (ref 80.0–100.0)
Platelets: 199 10*3/uL (ref 150–400)
RBC: 3.84 MIL/uL — ABNORMAL LOW (ref 4.22–5.81)
RDW: 14 % (ref 11.5–15.5)
WBC: 7.6 10*3/uL (ref 4.0–10.5)
nRBC: 0 % (ref 0.0–0.2)

## 2019-11-19 LAB — BASIC METABOLIC PANEL
Anion gap: 10 (ref 5–15)
BUN: 46 mg/dL — ABNORMAL HIGH (ref 8–23)
CO2: 22 mmol/L (ref 22–32)
Calcium: 8.8 mg/dL — ABNORMAL LOW (ref 8.9–10.3)
Chloride: 109 mmol/L (ref 98–111)
Creatinine, Ser: 2.15 mg/dL — ABNORMAL HIGH (ref 0.61–1.24)
GFR calc Af Amer: 36 mL/min — ABNORMAL LOW (ref >60)
GFR calc non Af Amer: 31 mL/min — ABNORMAL LOW (ref >60)
Glucose, Bld: 171 mg/dL — ABNORMAL HIGH (ref 70–99)
Potassium: 3.3 mmol/L — ABNORMAL LOW (ref 3.5–5.1)
Sodium: 141 mmol/L (ref 135–145)

## 2019-11-19 LAB — PROTIME-INR
INR: 1 (ref 0.8–1.2)
Prothrombin Time: 13.2 seconds (ref 11.4–15.2)

## 2019-11-19 LAB — TYPE AND SCREEN
ABO/RH(D): A POS
Antibody Screen: NEGATIVE

## 2019-11-19 LAB — TROPONIN I (HIGH SENSITIVITY)
Troponin I (High Sensitivity): 14 ng/L (ref <18)
Troponin I (High Sensitivity): 14 ng/L (ref <18)

## 2019-11-19 LAB — HEMOGLOBIN A1C
Hgb A1c MFr Bld: 7.1 % — ABNORMAL HIGH (ref 4.8–5.6)
Mean Plasma Glucose: 157.07 mg/dL

## 2019-11-19 LAB — SARS CORONAVIRUS 2 BY RT PCR (HOSPITAL ORDER, PERFORMED IN ~~LOC~~ HOSPITAL LAB): SARS Coronavirus 2: NEGATIVE

## 2019-11-19 MED ORDER — METHYLPREDNISOLONE SODIUM SUCC 125 MG IJ SOLR
125.0000 mg | Freq: Once | INTRAMUSCULAR | Status: AC
  Administered 2019-11-19: 125 mg via INTRAVENOUS
  Filled 2019-11-19: qty 2

## 2019-11-19 MED ORDER — CALCIUM CARBONATE ANTACID 500 MG PO CHEW: 1.0000 | CHEWABLE_TABLET | Freq: Three times a day (TID) | ORAL | Status: DC | PRN

## 2019-11-19 MED ORDER — GABAPENTIN 100 MG PO CAPS
100.0000 mg | ORAL_CAPSULE | Freq: Three times a day (TID) | ORAL | Status: DC
  Administered 2019-11-20 – 2019-11-21 (×4): 100 mg via ORAL
  Filled 2019-11-19 (×7): qty 1

## 2019-11-19 MED ORDER — GUAIFENESIN-DM 100-10 MG/5ML PO SYRP
10.0000 mL | ORAL_SOLUTION | Freq: Four times a day (QID) | ORAL | Status: DC | PRN
  Filled 2019-11-19: qty 10

## 2019-11-19 MED ORDER — ONDANSETRON 4 MG PO TBDP
4.0000 mg | ORAL_TABLET | Freq: Three times a day (TID) | ORAL | Status: DC | PRN
  Filled 2019-11-19: qty 1

## 2019-11-19 MED ORDER — METOPROLOL SUCCINATE ER 50 MG PO TB24
50.0000 mg | ORAL_TABLET | Freq: Every day | ORAL | Status: DC
  Administered 2019-11-20: 50 mg via ORAL
  Filled 2019-11-19: qty 1

## 2019-11-19 MED ORDER — AMLODIPINE BESYLATE 10 MG PO TABS
10.0000 mg | ORAL_TABLET | Freq: Every day | ORAL | Status: DC
  Administered 2019-11-20 – 2019-11-21 (×2): 10 mg via ORAL
  Filled 2019-11-19: qty 1
  Filled 2019-11-19: qty 2

## 2019-11-19 MED ORDER — INSULIN GLARGINE (1 UNIT DIAL) 300 UNIT/ML ~~LOC~~ SOPN: 15.0000 [IU] | PEN_INJECTOR | Freq: Every day | SUBCUTANEOUS | Status: DC

## 2019-11-19 MED ORDER — ACETAMINOPHEN 500 MG PO TABS: 1000.0000 mg | ORAL_TABLET | Freq: Three times a day (TID) | ORAL | Status: DC | PRN

## 2019-11-19 MED ORDER — INSULIN ASPART 100 UNIT/ML ~~LOC~~ SOLN
0.0000 [IU] | Freq: Three times a day (TID) | SUBCUTANEOUS | Status: DC
  Administered 2019-11-20 (×2): 2 [IU] via SUBCUTANEOUS
  Filled 2019-11-19 (×2): qty 1

## 2019-11-19 MED ORDER — POLYETHYLENE GLYCOL 3350 17 G PO PACK: 17.0000 g | PACK | Freq: Two times a day (BID) | ORAL | Status: DC | PRN

## 2019-11-19 MED ORDER — TIOTROPIUM BROMIDE MONOHYDRATE 18 MCG IN CAPS
18.0000 ug | ORAL_CAPSULE | Freq: Every day | RESPIRATORY_TRACT | Status: DC
  Administered 2019-11-20 – 2019-11-21 (×2): 18 ug via RESPIRATORY_TRACT
  Filled 2019-11-19: qty 5

## 2019-11-19 MED ORDER — PANTOPRAZOLE SODIUM 40 MG PO TBEC
40.0000 mg | DELAYED_RELEASE_TABLET | Freq: Every day | ORAL | Status: DC
  Administered 2019-11-20 – 2019-11-21 (×3): 40 mg via ORAL
  Filled 2019-11-19 (×3): qty 1

## 2019-11-19 MED ORDER — DOCUSATE SODIUM 100 MG PO CAPS: 100.0000 mg | ORAL_CAPSULE | Freq: Two times a day (BID) | ORAL | Status: DC | PRN

## 2019-11-19 MED ORDER — ALUM & MAG HYDROXIDE-SIMETH 200-200-20 MG/5ML PO SUSP: 15.0000 mL | Freq: Four times a day (QID) | ORAL | Status: DC | PRN

## 2019-11-19 MED ORDER — ONDANSETRON HCL 4 MG/2ML IJ SOLN: 4.0000 mg | Freq: Four times a day (QID) | INTRAMUSCULAR | Status: DC | PRN

## 2019-11-19 MED ORDER — ROSUVASTATIN CALCIUM 10 MG PO TABS
10.0000 mg | ORAL_TABLET | Freq: Every day | ORAL | Status: DC
  Administered 2019-11-20 – 2019-11-21 (×2): 10 mg via ORAL
  Filled 2019-11-19 (×2): qty 1

## 2019-11-19 MED ORDER — INSULIN GLARGINE 100 UNIT/ML ~~LOC~~ SOLN
15.0000 [IU] | Freq: Every day | SUBCUTANEOUS | Status: DC
  Administered 2019-11-20: 15 [IU] via SUBCUTANEOUS
  Filled 2019-11-19 (×2): qty 0.15

## 2019-11-19 MED ORDER — HYDRALAZINE HCL 50 MG PO TABS
100.0000 mg | ORAL_TABLET | Freq: Three times a day (TID) | ORAL | Status: DC
  Administered 2019-11-20 – 2019-11-21 (×3): 100 mg via ORAL
  Filled 2019-11-19 (×3): qty 2

## 2019-11-19 MED ORDER — ISOSORBIDE MONONITRATE ER 30 MG PO TB24
30.0000 mg | ORAL_TABLET | Freq: Every day | ORAL | Status: DC
  Administered 2019-11-20 – 2019-11-21 (×2): 30 mg via ORAL
  Filled 2019-11-19 (×2): qty 1

## 2019-11-19 NOTE — ED Notes (Signed)
Bright red and clear phlegm on paper towel. EDP notified. No emesis noted.

## 2019-11-19 NOTE — H&P (Signed)
History and Physical    Larry Callahan Edge GTX:[id number] DOB: 1956-03-24 DOA: 11/19/2019  PCP: [name], NP  [place]  I have personally briefly reviewed patient's old medical records in [place]  Chief Complaint: spitting up blood  HPI: Larry Callahan is a 64 y.o. male with medical history significant of left nephrotomy, CKD, COPD, OSA, DM2, HTN, dCHF who presented after spitting up blood.  Pt reported that around noon today, he started "spitting up blood" which was associated with chest pain (pt pointed to the area of the sternal angle).  Pt also complained of shortness of breath which was worse than his baseline chronic dyspnea.  Pt said he had a similar episode many years ago that lasted about 2 days and then self resolved with no etiology identified.  No fever, chills, cough, abdominal pain, nausea, diarrhea, dysuria, increased swelling.  Pt reported taking Lasix 80 mg BID with lots urine output.  Has COPD and uses inhalers at home.  Not currently smoking, no vaping, and no drug use.    Of note, pt has frequent ED visits and admission for chest pain, weakness.   ED Course: afebrile, pulse 78, BP 168/78.  EMS reported oxygen 88-90% on RA so was put on 2L  with 98% sat.  Labs notable for normal WBC, Hgb 11.2, Cr 2.15 (baseline), trop neg x2.  CT chest showed "No acute airspace disease, effusion, or pneumothorax."  COVID neg.  ED provider noted pt bringing up bloody secretion after coughing.  Pt received solumedrol 125 mg in the ED before admission for observation.   Assessment/Plan Active Problems:   Hemoptysis  # Likely hemoptysis --Per ED provider, bloody secretion came up after coughing.  CT chest showed no airspace disease.  Hgb 11.2 which is baseline.  Lung exam benign.  Has no other findings to suggest vasculitis.   PLAN: --Monitor for now  # Hx of COPD --Oxygen 88-90% on RA per EMS, which is acceptable for pt with  COPD. --continue home bronchodilators (Spiriva)  # HTN --continue home amlodipine, hydralazine, Imdur and Toprol --Hold home Lasix for now  # DM2 on insulin --A1c has been within goal --continue home Lantus at reduced 15 u daily (takes 26u at home) --ACHS and SSI TID (no bed-time coverage)  # OSA on CPAP  --Pt reported compliance with CPAP --continue CPAP nightly  # Neuropathy --continue home gabapentin  # GERD --continue PPI   DVT prophylaxis: SCD/Compression stockings Code Status: Full code Family Communication:   Disposition Plan: home  Consults called: none Admission status: Observation   Review of Systems: As per HPI otherwise 10 point review of systems negative.   Past Medical History:  Diagnosis Date  . Allergy   . Coronary artery disease   . Diabetes mellitus without complication ([place])   . Diastolic CHF ([place])   . GERD (gastroesophageal reflux disease)   . Hyperlipidemia   . Hypertension   . Renal insufficiency   . Sleep apnea     Past Surgical History:  Procedure Laterality Date  . BACK SURGERY    . CARDIAC CATHETERIZATION    . CHOLECYSTECTOMY    . COLONOSCOPY WITH PROPOFOL N/A 04/12/2019   Procedure: COLONOSCOPY WITH PROPOFOL;  Surgeon: [name], MD;  Location: [facility] ENDOSCOPY;  Service: Gastroenterology;  Laterality: N/A;  . ESOPHAGOGASTRODUODENOSCOPY N/A 04/12/2019   Procedure: ESOPHAGOGASTRODUODENOSCOPY (EGD);  Surgeon: [name], MD;  Location: [facility] ENDOSCOPY;  Service: Gastroenterology;  Laterality: N/A;  .  FLEXIBLE BRONCHOSCOPY Bilateral 05/17/2019   Procedure: FLEXIBLE BRONCHOSCOPY;  Surgeon: [name], MD;  Location: [name] ORS;  Service: Pulmonary;  Laterality: Bilateral;  . left arm surgery    . nephrectomy Left   . PARATHYROIDECTOMY    . RIGHT HEART CATH N/A 03/29/2019   Procedure: RIGHT HEART CATH;  Surgeon: [name], MD;  Location: [facility];  Service: Cardiovascular;  Laterality: N/A;      reports that he has quit smoking. His smoking use included cigarettes. He has quit using smokeless tobacco.  His smokeless tobacco use included chew. He reports that he does not drink alcohol or use drugs.  Allergies  Allergen Reactions  . Penicillins Rash    Family History  Problem Relation Age of Onset  . Diabetes Mother   . Lung cancer Father   . Diabetes Sister   . Hypertension Sister   . Heart disease Sister   . Cancer Sister   . Bone cancer Brother      Prior to Admission medications   Medication Sig Start Date End Date Taking? Authorizing Provider  acetaminophen (TYLENOL) 325 MG tablet Take 2 tablets (650 mg total) by mouth every 4 (four) hours as needed for headache or mild pain. 03/05/19   Gouru, Illene Silver, MD  albuterol (VENTOLIN HFA) 108 (90 Base) MCG/ACT inhaler Inhale 2 puffs into the lungs every 6 (six) hours as needed for wheezing or shortness of breath. 03/05/19   Gouru, Illene Silver, MD  amLODipine (NORVASC) 10 MG tablet Take 1 tablet (10 mg total) by mouth daily. 08/01/19   [name], NP  Blood Glucose Calibration (TRUE METRIX LEVEL 1) Low SOLN Use as directed 04/04/18   [name], NP  Blood Glucose Monitoring Suppl (TRUE METRIX AIR GLUCOSE METER) DEVI 1 Device by Does not apply route 2 (two) times daily. 04/13/18   [name], NP  ferrous gluconate (FERGON) 324 MG tablet Take 1 tablet (324 mg total) by mouth daily with breakfast. 08/01/19   [name], NP  furosemide (LASIX) 80 MG tablet Take 1 tablet (80 mg total) by mouth 2 (two) times daily. 10/16/19   [name], NP  gabapentin (NEURONTIN) 100 MG capsule Take 1 capsule (100 mg total) by mouth in the morning, at noon, and at bedtime. 08/30/19   [name], NP  gemfibrozil (LOPID) 600 MG tablet Take 1 tablet (600 mg total) by mouth 2 (two) times daily with a meal. 06/27/19   Boscia, Greer Ee, NP  glimepiride (AMARYL) 2 MG tablet Take 1 tablet (2 mg total) by mouth at bedtime. 08/02/19   Lavera Guise, MD  glucose blood (TRUE METRIX BLOOD GLUCOSE TEST) test strip Use as instructed twice daily diag E11.65 07/30/19   [name], NP  guaiFENesin (MUCINEX) 600 MG 12 hr tablet Take 1 tablet (600 mg total) by mouth 2 (two) times daily. 06/18/19   Lavina Hamman, MD  hydrALAZINE (APRESOLINE) 100 MG tablet TAKE 1 TABLET THREE TIMES DAILY 10/16/19   [name], NP  hydrOXYzine (ATARAX) 10 MG/5ML syrup Take 5 mLs (10 mg total) by mouth 3 (three) times daily as needed for itching. 09/24/19   Sable Feil, PA-C  insulin glargine, 1 Unit Dial, (TOUJEO) 300 UNIT/ML Solostar Pen Inject 26 Units into the skin daily. 10/24/19   [name], NP  ipratropium-albuterol (DUONEB) 0.5-2.5 (3) MG/3ML SOLN Take 3 mLs by nebulization every 6 (six) hours as needed. 03/08/19  Kendell Bane, NP  isosorbide mononitrate (IMDUR) 30 MG 24 hr tablet Take 1 tablet (30 mg total) by mouth daily. 08/02/19   [name], NP  loratadine (CLARITIN) 10 MG tablet Take 1 tablet (10 mg total) by mouth daily. Patient taking differently: Take 10 mg by mouth daily as needed for allergies.  04/01/19   Nicholes Mango, MD  metoprolol succinate (TOPROL-XL) 50 MG 24 hr tablet Take 1 tablet (50 mg total) by mouth at bedtime. Take with or immediately following a meal. 07/10/19   Lavera Guise, MD  naproxen (NAPROSYN) 500 MG tablet Take 1 tablet (500 mg total) by mouth 2 (two) times daily with a meal. 09/24/19   Sable Feil, PA-C  nitroGLYCERIN (NITROSTAT) 0.4 MG SL tablet Place 1 tablet (0.4 mg total) under the tongue every 5 (five) minutes x 3 doses as needed for chest pain. 05/24/19   Loletha Grayer, MD  NOVOFINE 32G X 6 MM MISC To use with [place] injections daily 12/13/18   [name], NP  omeprazole (PRILOSEC) 40 MG capsule 1 cap daily Patient taking differently: Take 40 mg by mouth daily.  07/10/19   Lavera Guise, MD  potassium chloride SA (KLOR-CON) 20 MEQ tablet Take 1 tablet (20 mEq total) by mouth 2 (two)  times daily. 08/30/19   [name], NP  predniSONE (DELTASONE) 50 MG tablet Take 1 tablet (50 mg total) by mouth daily with breakfast. 11/10/19   Cuthriell, Charline Bills, PA-C  rosuvastatin (CRESTOR) 10 MG tablet Take 1 tablet (10 mg total) by mouth daily. 08/23/19 11/21/19  [name], MD  sodium chloride (OCEAN) 0.65 % SOLN nasal spray Place 1 spray into both nostrils as needed for congestion. 06/18/19   Lavina Hamman, MD  sulfamethoxazole-trimethoprim (BACTRIM DS) 800-160 MG tablet Take 1 tablet by mouth 2 (two) times daily. 09/26/19   [name], NP  tiotropium (SPIRIVA HANDIHALER) 18 MCG inhalation capsule Place 1 capsule (18 mcg total) into inhaler and inhale daily. 08/07/19 11/05/19  [name], NP  TRUEplus Lancets 33G MISC Use as directed twice daily diag E11.65 07/30/19   [name], NP    Physical Exam: Vitals:   11/19/19 1450 11/19/19 1530 11/19/19 1600  BP: (!) 168/78 (!) 134/50 (!) 137/58  Pulse: 78 79 68  Resp: _0 Temp: 98.3 F (36.8 C)    TempSrc: Oral    SpO2: 99% 97% 95%  Weight: 113 kg    Height: _1  (1.803 m)      Constitutional: NAD, AAOx3 HEENT: conjunctivae and lids normal, EOMI CV: RRR no M,R,G. Distal pulses +2.  No cyanosis.   RESP: CTA B/L, normal respiratory effort, on 2L GI: +BS, ND, soft, tender to palpation over RLQ Extremities: No effusions, edema, or tenderness in BLE SKIN: warm, dry and intact Neuro: II - XII grossly intact.  Sensation intact Psych: Normal mood and affect.     Labs on Admission: I have personally reviewed following labs and imaging studies  CBC: Recent Labs  Lab 11/19/19 1451  WBC 7.6  HGB 11.2*  HCT 33.6*  MCV 87.5  PLT 063   Basic Metabolic Panel: Recent Labs  Lab 11/19/19 1451  NA 141  K 3.3*  CL 109  CO2 22  GLUCOSE 171*  BUN 46*  CREATININE 2.15*  CALCIUM 8.8*   GFR: Estimated Creatinine Clearance: 43.2 mL/min (A) (by C-G formula based on SCr of 2.15 mg/dL  (H)). Liver  Function Tests: No results for input(s): AST, ALT, ALKPHOS, BILITOT, PROT, ALBUMIN in the last 168 hours. No results for input(s): LIPASE, AMYLASE in the last 168 hours. No results for input(s): AMMONIA in the last 168 hours. Coagulation Profile: Recent Labs  Lab 11/19/19 1451  INR 1.0   Cardiac Enzymes: No results for input(s): CKTOTAL, CKMB, CKMBINDEX, TROPONINI in the last 168 hours. BNP (last 3 results) No results for input(s): PROBNP in the last 8760 hours. HbA1C: Recent Labs    11/19/19 1451  HGBA1C 7.1*   CBG: No results for input(s): GLUCAP in the last 168 hours. Lipid Profile: No results for input(s): CHOL, HDL, LDLCALC, TRIG, CHOLHDL, LDLDIRECT in the last 72 hours. Thyroid Function Tests: No results for input(s): TSH, T4TOTAL, FREET4, T3FREE, THYROIDAB in the last 72 hours. Anemia Panel: No results for input(s): VITAMINB12, FOLATE, FERRITIN, TIBC, IRON, RETICCTPCT in the last 72 hours. Urine analysis:    Component Value Date/Time   COLORURINE YELLOW (A) 08/08/2019 1657   APPEARANCEUR HAZY (A) 08/08/2019 1657   APPEARANCEUR Cloudy (A) 11/23/2018 1103   LABSPEC 1.012 08/08/2019 1657   LABSPEC 1.005 03/06/2013 0929   PHURINE 5.0 08/08/2019 1657   GLUCOSEU 50 (A) 08/08/2019 1657   GLUCOSEU Negative 03/06/2013 0929   HGBUR NEGATIVE 08/08/2019 1657   BILIRUBINUR NEGATIVE 08/08/2019 1657   BILIRUBINUR negative 02/20/2019 1404   BILIRUBINUR Negative 11/23/2018 1103   BILIRUBINUR Negative 03/06/2013 0929   KETONESUR NEGATIVE 08/08/2019 1657   PROTEINUR 100 (A) 08/08/2019 1657   UROBILINOGEN 0.2 02/20/2019 1404   NITRITE NEGATIVE 08/08/2019 1657   LEUKOCYTESUR NEGATIVE 08/08/2019 1657   LEUKOCYTESUR Negative 03/06/2013 0929    Radiological Exams on Admission: DG Chest 2 View  Result Date: 11/19/2019 CLINICAL DATA:  Chest pain and nausea. EXAM: CHEST - 2 VIEW COMPARISON:  Nov 10, 2019 FINDINGS: Mild, diffuse chronic appearing increased lung markings  are seen. There is no evidence of acute infiltrate, pleural effusion or pneumothorax. The heart size and mediastinal contours are within normal limits. The visualized skeletal structures are unremarkable. IMPRESSION: No active cardiopulmonary disease. Electronically Signed   By: Virgina Norfolk M.D.   On: 11/19/2019 15:50   CT Chest Wo Contrast  Result Date: 11/19/2019 CLINICAL DATA:  Hemoptysis, chest pain, nausea EXAM: CT CHEST WITHOUT CONTRAST TECHNIQUE: Multidetector CT imaging of the chest was performed following the standard protocol without IV contrast. COMPARISON:  11/19/2019, 05/09/2019 FINDINGS: Cardiovascular: Unenhanced imaging of the heart and great vessels demonstrates no pericardial effusion. There is moderate atherosclerosis of the aortic arch. There is severe atherosclerosis of the LAD distribution of the coronary vasculature. Normal caliber of the thoracic aorta. Mediastinum/Nodes: No enlarged mediastinal or axillary lymph nodes. Thyroid gland, trachea, and esophagus demonstrate no significant findings. Lungs/Pleura: No acute airspace disease, effusion, or pneumothorax. Upper Abdomen: Partial visualization of a left adrenal mass measuring 7.7 x 5.6 cm. Please refer to prior CT abdomen report 04/24/2019 describing probable left adrenal adenoma. Right renal cyst is partially visualized. The remainder of the upper abdomen is unremarkable. Musculoskeletal: Prior resection of the left eleventh rib related to nephrectomy, with herniation of fat through the surgical defect unchanged. No acute or destructive bony lesions. Reconstructed images demonstrate no additional findings. IMPRESSION: 1. No acute intrathoracic process. 2. Partial visualization of left adrenal adenoma, please refer to prior abdominal CT report 04/24/2019 for full characterization and recommendation. 3. Aortic Atherosclerosis (ICD10-I70.0). Electronically Signed   By: Randa Ngo M.D.   On: 11/19/2019 16:53  Enzo Bi  MD [facility]  If 7PM-7AM, please contact night-coverage 11/19/2019, 7:54 PM

## 2019-11-19 NOTE — ED Triage Notes (Signed)
BIB by [facility] from home c/o chest pain, nausea, and dark red bloody emesis. EMS reports oxygen 88-90% on RA. Arrives on 2L [place]-98%. CBG WNL

## 2019-11-19 NOTE — ED Notes (Signed)
Pt provided with meal tray and diet soda.

## 2019-11-19 NOTE — ED Provider Notes (Signed)
[facility] Emergency Department Provider Note  Time seen: 3:16 PM  I have reviewed the triage vital signs and the nursing notes.   HISTORY  Chief Complaint Chest Pain   HPI Larry Callahan is a 64 y.o. male with a past medical history of CAD, diabetes, gastric reflux, hypertension, hyperlipidemia, CKD, presents to the emergency department with chest pain and dark vomitus.  According to the patient around 1 PM today  his stomach began hurting, states he went to the bathroom and vomited what appeared to be dark vomitus.  Patient states shortly afterwards he developed pain across the center of his chest.  Denies any shortness of breath denies any current nausea.  Is not had any vomiting since that episode.  Denies any fever or cough.  Denies alcohol use or NSAID use.  Is not sure if he has had any dark stool.  Past Medical History:  Diagnosis Date  . Allergy   . Coronary artery disease   . Diabetes mellitus without complication ([place])   . Diastolic CHF ([place])   . GERD (gastroesophageal reflux disease)   . Hyperlipidemia   . Hypertension   . Renal insufficiency   . Sleep apnea     Patient Active Problem List   Diagnosis Date Noted  . Cellulitis of left hand 09/26/2019  . Left-sided weakness 08/08/2019  . BMI 35.0-35.9,adult 08/08/2019  . Acute right hip pain 06/17/2019  . Inability to ambulate due to hip 06/17/2019  . CKD stage 3 due to type 2 diabetes mellitus ([place])   . Hypervolemia   . Acute on chronic diastolic CHF (congestive heart failure) ([place])   . Lobar pneumonia ([place])   . Type 2 diabetes mellitus with stage 3b chronic kidney disease, with long-term current use of insulin ([place])   . Hypokalemia   . Hypomagnesemia   . Full code status 05/20/2019  . Pleuritic chest pain   . CAP (community acquired pneumonia) 05/19/2019  . AKI (acute kidney injury) ([place]) 05/09/2019  . Hemoptysis 04/09/2019  . Shortness of breath 03/26/2019  . Uncontrolled  type 2 diabetes mellitus with insulin therapy ([place]) 03/12/2019  . Acute on chronic heart failure with preserved ejection fraction (HFpEF) ([place])   . Acute kidney injury superimposed on chronic kidney disease ([place])   . CHF (congestive heart failure) ([place]) 03/01/2019  . Diastolic dysfunction 92/33/0076  . Iron deficiency anemia 12/31/2018  . Atopic dermatitis 11/24/2018  . Screening for colon cancer 11/06/2017  . Uncontrolled type 2 diabetes mellitus with hyperglycemia ([place]) 11/06/2017  . Dermatitis, unspecified 06/09/2017  . Atherosclerotic heart disease of native coronary artery without angina pectoris 06/09/2017  . 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 Obstructive sleep apnea, adult 06/09/2017  . Morbid (severe) obesity due to excess calories ([place]) 06/09/2017  . Cigarette nicotine dependence with nicotine-induced disorder 06/09/2017  . Diabetic polyneuropathy associated with type 2 diabetes mellitus ([place]) 06/09/2017  . Allergic rhinitis due to pollen 06/09/2017  . Mixed hyperlipidemia 06/09/2017  . COPD with acute exacerbation ([place]) 06/09/2017  . Dyspnea on exertion 06/09/2017  . Wheezing 06/09/2017  . Dysuria 06/09/2017  . Snoring 06/09/2017  . Essential hypertension 06/09/2017  . Personal history of other malignant neoplasm of kidney 06/09/2017  . Pain in right hip 06/09/2017  . Impacted cerumen, bilateral 06/09/2017  . Secondary hyperparathyroidism, not elsewhere classified ([place]) 06/09/2017  . Type 2 diabetes mellitus with hyperglycemia ([place]) 06/09/2017  . Tinea corporis 06/09/2017    Past Surgical  History:  Procedure Laterality Date  . BACK SURGERY    . CARDIAC CATHETERIZATION    . CHOLECYSTECTOMY    . COLONOSCOPY WITH PROPOFOL N/A 04/12/2019   Procedure: COLONOSCOPY WITH PROPOFOL;  Surgeon: [name], MD;  Location: [facility] ENDOSCOPY;  Service: Gastroenterology;  Laterality: N/A;  . ESOPHAGOGASTRODUODENOSCOPY N/A 04/12/2019    Procedure: ESOPHAGOGASTRODUODENOSCOPY (EGD);  Surgeon: [name], MD;  Location: [facility] ENDOSCOPY;  Service: Gastroenterology;  Laterality: N/A;  . FLEXIBLE BRONCHOSCOPY Bilateral 05/17/2019   Procedure: FLEXIBLE BRONCHOSCOPY;  Surgeon: [name], MD;  Location: [name] ORS;  Service: Pulmonary;  Laterality: Bilateral;  . left arm surgery    . nephrectomy Left   . PARATHYROIDECTOMY    . RIGHT HEART CATH N/A 03/29/2019   Procedure: RIGHT HEART CATH;  Surgeon: [name], MD;  Location: [place] CV LAB;  Service: Cardiovascular;  Laterality: N/A;    Prior to Admission medications   Medication Sig Start Date End Date Taking? Authorizing Provider  acetaminophen (TYLENOL) 325 MG tablet Take 2 tablets (650 mg total) by mouth every 4 (four) hours as needed for headache or mild pain. 03/05/19   Gouru, Illene Silver, MD  albuterol (VENTOLIN HFA) 108 (90 Base) MCG/ACT inhaler Inhale 2 puffs into the lungs every 6 (six) hours as needed for wheezing or shortness of breath. 03/05/19   Gouru, Illene Silver, MD  amLODipine (NORVASC) 10 MG tablet Take 1 tablet (10 mg total) by mouth daily. 08/01/19   [name], NP  Blood Glucose Calibration (TRUE METRIX LEVEL 1) Low SOLN Use as directed 04/04/18   [name], NP  Blood Glucose Monitoring Suppl (TRUE METRIX AIR GLUCOSE METER) DEVI 1 Device by Does not apply route 2 (two) times daily. 04/13/18   [name], NP  ferrous gluconate (FERGON) 324 MG tablet Take 1 tablet (324 mg total) by mouth daily with breakfast. 08/01/19   [name], NP  furosemide (LASIX) 80 MG tablet Take 1 tablet (80 mg total) by mouth 2 (two) times daily. 10/16/19   [name], NP  gabapentin (NEURONTIN) 100 MG capsule Take 1 capsule (100 mg total) by mouth in the morning, at noon, and at bedtime. 08/30/19   [name], NP  gemfibrozil (LOPID) 600 MG tablet Take 1 tablet (600 mg total) by mouth 2 (two) times daily with a meal. 06/27/19   Boscia, Greer Ee,  NP  glimepiride (AMARYL) 2 MG tablet Take 1 tablet (2 mg total) by mouth at bedtime. 08/02/19   Lavera Guise, MD  glucose blood (TRUE METRIX BLOOD GLUCOSE TEST) test strip Use as instructed twice daily diag E11.65 07/30/19   [name], NP  guaiFENesin (MUCINEX) 600 MG 12 hr tablet Take 1 tablet (600 mg total) by mouth 2 (two) times daily. 06/18/19   Lavina Hamman, MD  hydrALAZINE (APRESOLINE) 100 MG tablet TAKE 1 TABLET THREE TIMES DAILY 10/16/19   [name], NP  hydrOXYzine (ATARAX) 10 MG/5ML syrup Take 5 mLs (10 mg total) by mouth 3 (three) times daily as needed for itching. 09/24/19   Sable Feil, PA-C  insulin glargine, 1 Unit Dial, (TOUJEO) 300 UNIT/ML Solostar Pen Inject 26 Units into the skin daily. 10/24/19   [name], NP  ipratropium-albuterol (DUONEB) 0.5-2.5 (3) MG/3ML SOLN Take 3 mLs by nebulization every 6 (six) hours as needed. 03/08/19   Kendell Bane, NP  isosorbide mononitrate (IMDUR) 30 MG 24 hr tablet Take 1 tablet (30 mg  total) by mouth daily. 08/02/19   [name], NP  loratadine (CLARITIN) 10 MG tablet Take 1 tablet (10 mg total) by mouth daily. Patient taking differently: Take 10 mg by mouth daily as needed for allergies.  04/01/19   Nicholes Mango, MD  metoprolol succinate (TOPROL-XL) 50 MG 24 hr tablet Take 1 tablet (50 mg total) by mouth at bedtime. Take with or immediately following a meal. 07/10/19   Lavera Guise, MD  naproxen (NAPROSYN) 500 MG tablet Take 1 tablet (500 mg total) by mouth 2 (two) times daily with a meal. 09/24/19   Sable Feil, PA-C  nitroGLYCERIN (NITROSTAT) 0.4 MG SL tablet Place 1 tablet (0.4 mg total) under the tongue every 5 (five) minutes x 3 doses as needed for chest pain. 05/24/19   Loletha Grayer, MD  NOVOFINE 32G X 6 MM MISC To use with [place] injections daily 12/13/18   [name], NP  omeprazole (PRILOSEC) 40 MG capsule 1 cap daily Patient taking differently: Take 40 mg by mouth daily.  07/10/19   Lavera Guise, MD  potassium chloride SA (KLOR-CON) 20 MEQ tablet Take 1 tablet (20 mEq total) by mouth 2 (two) times daily. 08/30/19   [name], NP  predniSONE (DELTASONE) 50 MG tablet Take 1 tablet (50 mg total) by mouth daily with breakfast. 11/10/19   Cuthriell, Charline Bills, PA-C  rosuvastatin (CRESTOR) 10 MG tablet Take 1 tablet (10 mg total) by mouth daily. 08/23/19 11/21/19  [name], MD  sodium chloride (OCEAN) 0.65 % SOLN nasal spray Place 1 spray into both nostrils as needed for congestion. 06/18/19   Lavina Hamman, MD  sulfamethoxazole-trimethoprim (BACTRIM DS) 800-160 MG tablet Take 1 tablet by mouth 2 (two) times daily. 09/26/19   [name], NP  tiotropium (SPIRIVA HANDIHALER) 18 MCG inhalation capsule Place 1 capsule (18 mcg total) into inhaler and inhale daily. 08/07/19 11/05/19  [name], NP  TRUEplus Lancets 33G MISC Use as directed twice daily diag E11.65 07/30/19   [name], NP    Allergies  Allergen Reactions  . Penicillins Rash    Family History  Problem Relation Age of Onset  . Diabetes Mother   . Lung cancer Father   . Diabetes Sister   . Hypertension Sister   . Heart disease Sister   . Cancer Sister   . Bone cancer Brother     Social History Social History   Tobacco Use  . Smoking status: Former Smoker    Types: Cigarettes  . Smokeless tobacco: Former Systems developer    Types: Chew  Substance Use Topics  . Alcohol use: No  . Drug use: No    Review of Systems Constitutional: Negative for fever. Cardiovascular: Positive for mild chest discomfort/pain. Respiratory: Negative for shortness of breath. Gastrointestinal: Negative for abdominal pain positive for dark vomitus this morning.  None since. Musculoskeletal: Negative for musculoskeletal complaints Neurological: Negative for headache All other ROS negative  ____________________________________________   PHYSICAL EXAM:  VITAL SIGNS: ED Triage Vitals [11/19/19 1450]  Enc Vitals  Group     BP (!) 168/78     Pulse Rate 78     Resp 20     Temp 98.3 F (36.8 C)     Temp Source Oral     SpO2 99 %     Weight 249 lb 1.9 oz (113 kg)     Height _0  (1.803 m)     Head Circumference  Peak Flow      Pain Score 8     Pain Loc      Pain Edu?      Excl. in [place]?     Constitutional: Alert and oriented. Well appearing and in no distress. Eyes: Normal exam ENT      Head: Normocephalic and atraumatic.      Nose: No congestion/rhinnorhea.      Mouth/Throat: Mucous membranes are moist. Cardiovascular: Normal rate, regular rhythm. No murmurs, rubs, or gallops. Respiratory: Normal respiratory effort without tachypnea nor retractions. Breath sounds are clear  Gastrointestinal: Soft and nontender. No distention.   Musculoskeletal: Nontender with normal range of motion in all extremities.  Neurologic:  Normal speech and language. No gross focal neurologic deficits  Skin:  Skin is warm, dry and intact.  Psychiatric: Mood and affect are normal.   ____________________________________________    EKG  EKG viewed and interpreted by myself shows a normal sinus rhythm at 76 bpm with a narrow QRS, normal axis, normal intervals, no concerning ST changes.  ____________________________________________    RADIOLOGY  Chest x-ray is negative  ____________________________________________   INITIAL IMPRESSION / ASSESSMENT AND PLAN / ED COURSE  Pertinent labs & imaging results that were available during my care of the patient were reviewed by me and considered in my medical decision making (see chart for details).   Patient presents to the emergency department for dark vomitus and chest discomfort starting around 1 PM today.  Currently the patient appears well.  He does sat in the low 90s on room air but has a history of COPD wears a CPAP at night which he states has oxygen as well.  Patient's rectal exam shows light brown stool guaiac negative.  We will check labs including  cardiac enzymes.  EKG is nonrevealing.  Chest x-ray pending.  Differential this time would include ACS, chest wall pain, GI bleed, vomitus, gastroenteritis, gastritis or esophagitis.   Patient called me into the room to show me the bright red blood. It is clearly sputum with the patient is coughing up and spitting out. It is a moderate amount of hemoptysis. Given the patient's borderline low O2 sat on room air with hemoptysis we will proceed with CT scan of the chest. However given the patient's chronic kidney disease and history of a nephrectomy we will avoid IV contrast and proceed with CT without to evaluate for possible causes such as tumor mass or pneumonia.  Patient CT scan is essentially negative.  However given the hemoptysis of bright red blood we will admit to the hospitalist service for continued monitoring.  We will dose IV steroids.  Patient agreeable to plan of care.  Gian Ybarra Beeney was evaluated in Emergency Department on 11/19/2019 for the symptoms described in the history of present illness. He was evaluated in the context of the global COVID-19 pandemic, which necessitated consideration that the patient might be at risk for infection with the SARS-CoV-2 virus that causes COVID-19. Institutional protocols and algorithms that pertain to the evaluation of patients at risk for COVID-19 are in a state of rapid change based on information released by regulatory bodies including the CDC and federal and state organizations. These policies and algorithms were followed during the patient's care in the ED.  ____________________________________________   FINAL CLINICAL IMPRESSION(S) / ED DIAGNOSES  Vomiting Chest pain Hemoptysis   Harvest Dark, MD 11/19/19 2307

## 2019-11-20 ENCOUNTER — Other Ambulatory Visit: Payer: Self-pay

## 2019-11-20 ENCOUNTER — Encounter: Payer: Self-pay | Admitting: Hospitalist

## 2019-11-20 DIAGNOSIS — R042 Hemoptysis: Secondary | ICD-10-CM | POA: Diagnosis not present

## 2019-11-20 LAB — MAGNESIUM: Magnesium: 2.1 mg/dL (ref 1.7–2.4)

## 2019-11-20 LAB — GLUCOSE, CAPILLARY
Glucose-Capillary: 115 mg/dL — ABNORMAL HIGH (ref 70–99)
Glucose-Capillary: 131 mg/dL — ABNORMAL HIGH (ref 70–99)
Glucose-Capillary: 149 mg/dL — ABNORMAL HIGH (ref 70–99)
Glucose-Capillary: 341 mg/dL — ABNORMAL HIGH (ref 70–99)

## 2019-11-20 LAB — CBC
HCT: 33 % — ABNORMAL LOW (ref 39.0–52.0)
Hemoglobin: 10.9 g/dL — ABNORMAL LOW (ref 13.0–17.0)
MCH: 28.7 pg (ref 26.0–34.0)
MCHC: 33 g/dL (ref 30.0–36.0)
MCV: 86.8 fL (ref 80.0–100.0)
Platelets: 203 10*3/uL (ref 150–400)
RBC: 3.8 MIL/uL — ABNORMAL LOW (ref 4.22–5.81)
RDW: 13.6 % (ref 11.5–15.5)
WBC: 6.4 10*3/uL (ref 4.0–10.5)
nRBC: 0 % (ref 0.0–0.2)

## 2019-11-20 LAB — BASIC METABOLIC PANEL
Anion gap: 9 (ref 5–15)
BUN: 44 mg/dL — ABNORMAL HIGH (ref 8–23)
CO2: 24 mmol/L (ref 22–32)
Calcium: 8.9 mg/dL (ref 8.9–10.3)
Chloride: 107 mmol/L (ref 98–111)
Creatinine, Ser: 1.86 mg/dL — ABNORMAL HIGH (ref 0.61–1.24)
GFR calc Af Amer: 43 mL/min — ABNORMAL LOW (ref >60)
GFR calc non Af Amer: 37 mL/min — ABNORMAL LOW (ref >60)
Glucose, Bld: 243 mg/dL — ABNORMAL HIGH (ref 70–99)
Potassium: 3.9 mmol/L (ref 3.5–5.1)
Sodium: 140 mmol/L (ref 135–145)

## 2019-11-20 MED ORDER — IPRATROPIUM-ALBUTEROL 0.5-2.5 (3) MG/3ML IN SOLN: 3.0000 mL | Freq: Four times a day (QID) | RESPIRATORY_TRACT | Status: DC

## 2019-11-20 MED ORDER — INSULIN GLARGINE 100 UNIT/ML ~~LOC~~ SOLN
26.0000 [IU] | Freq: Every day | SUBCUTANEOUS | Status: DC
  Administered 2019-11-20 – 2019-11-21 (×2): 26 [IU] via SUBCUTANEOUS
  Filled 2019-11-20 (×3): qty 0.26

## 2019-11-20 MED ORDER — IPRATROPIUM-ALBUTEROL 0.5-2.5 (3) MG/3ML IN SOLN
3.0000 mL | Freq: Four times a day (QID) | RESPIRATORY_TRACT | Status: DC
  Administered 2019-11-21: 3 mL via RESPIRATORY_TRACT
  Filled 2019-11-20: qty 3

## 2019-11-20 NOTE — ED Notes (Signed)
Denies needs at this time

## 2019-11-20 NOTE — ED Notes (Signed)
Pt provided with urinal for use at bedside

## 2019-11-20 NOTE — ED Notes (Signed)
Pt given warm blanket.

## 2019-11-20 NOTE — Progress Notes (Signed)
PROGRESS NOTE    Anes Rigel Clayton  ZHY:[id number] DOB: 1955-12-05 DOA: 11/19/2019 PCP: [name], NP    Assessment & Plan:   Active Problems:   Hemoptysis    Larry Callahan is a 64 y.o. male with medical history significant of left nephrotomy, CKD, COPD, OSA, DM2, HTN, dCHF who presented after spitting up blood.   # Likely hemoptysis --Per ED provider, bloody secretion came up after coughing.  CT chest showed no airspace disease.  Hgb 11.2 which is baseline.  Lung exam benign.  Has no other findings to suggest vasculitis.  Combined with pain in the mid upper chest, maybe esophagitis? PLAN: --Will consult GI tomorrow for possible EGD --NPO MN  # Hx of COPD # Chronic dyspnea --Oxygen 88-90% on RA per EMS, which is acceptable for pt with COPD.   --continue home bronchodilators (Spiriva) and DuoNeb as QID  # HTN --continue home amlodipine, hydralazine, Imdur --Hold home Lasix for now --Hold home Toprol due to bradycardia  # DM2 on insulin --A1c has been within goal --continue home Lantus at 26u daily --ACHS and SSI TID (no bed-time coverage)  # OSA on CPAP  --Pt reported compliance with CPAP --continue CPAP nightly  # Neuropathy --continue home gabapentin  # GERD --continue PPI    DVT prophylaxis: SCD/Compression stockings Code Status: Full code  Family Communication:  Status is: observation Dispo:   The patient is from: home Anticipated d/c is to: home Anticipated d/c date is: likely tomorrow Patient currently is not medically stable to d/c due to: continue workup on source of blood that pt spits up   Subjective and Interval History:  Pt complained mostly of pain in his upper mid chest.  Reported still spitting up blood, maybe not as much.  Also complained of dyspnea.  No fever, N/V/D, dysuria, increased swelling.    Objective: Vitals:   11/20/19 1345 11/20/19 1404 11/20/19 1652 11/20/19 2001  BP:  (!) 170/75 (!) 158/75 (!)  151/65  Pulse: 66 65 (!) 50   Resp: 13 (!) 22 18   Temp:  97.9 F (36.6 C) (!) 97.5 F (36.4 C) 98.2 F (36.8 C)  TempSrc:  Oral Oral Oral  SpO2: 98% 95% 99%   Weight:      Height:       No intake or output data in the 24 hours ending 11/20/19 2135 Filed Weights   11/19/19 1450  Weight: 113 kg    Examination:   Constitutional: NAD, AAOx3 HEENT: conjunctivae and lids normal, EOMI CV: RRR no M,R,G. Distal pulses +2.  No cyanosis.   RESP: CTA B/L, normal respiratory effort, on RA GI: +BS, ND, soft Extremities: No effusions, edema, or tenderness in BLE SKIN: warm, dry and intact Neuro: II - XII grossly intact.  Sensation intact Psych: Normal mood and affect.     Data Reviewed: I have personally reviewed following labs and imaging studies  CBC: Recent Labs  Lab 11/19/19 1451 11/20/19 0508  WBC 7.6 6.4  HGB 11.2* 10.9*  HCT 33.6* 33.0*  MCV 87.5 86.8  PLT 199 295   Basic Metabolic Panel: Recent Labs  Lab 11/19/19 1451 11/20/19 0508  NA 141 140  K 3.3* 3.9  CL 109 107  CO2 22 24  GLUCOSE 171* 243*  BUN 46* 44*  CREATININE 2.15* 1.86*  CALCIUM 8.8* 8.9  MG  --  2.1   GFR: Estimated Creatinine Clearance: 50 mL/min (A) (by C-G formula based on SCr of 1.86 mg/dL (H)).  Liver Function Tests: No results for input(s): AST, ALT, ALKPHOS, BILITOT, PROT, ALBUMIN in the last 168 hours. No results for input(s): LIPASE, AMYLASE in the last 168 hours. No results for input(s): AMMONIA in the last 168 hours. Coagulation Profile: Recent Labs  Lab 11/19/19 1451  INR 1.0   Cardiac Enzymes: No results for input(s): CKTOTAL, CKMB, CKMBINDEX, TROPONINI in the last 168 hours. BNP (last 3 results) No results for input(s): PROBNP in the last 8760 hours. HbA1C: Recent Labs    11/19/19 1451  HGBA1C 7.1*   CBG: Recent Labs  Lab 11/20/19 0057 11/20/19 1147 11/20/19 1649  GLUCAP 341* 149* 131*   Lipid Profile: No results for input(s): CHOL, HDL, LDLCALC, TRIG,  CHOLHDL, LDLDIRECT in the last 72 hours. Thyroid Function Tests: No results for input(s): TSH, T4TOTAL, FREET4, T3FREE, THYROIDAB in the last 72 hours. Anemia Panel: No results for input(s): VITAMINB12, FOLATE, FERRITIN, TIBC, IRON, RETICCTPCT in the last 72 hours. Sepsis Labs: No results for input(s): PROCALCITON, LATICACIDVEN in the last 168 hours.  Recent Results (from the past 240 hour(s))  SARS Coronavirus 2 by RT PCR (hospital order, performed in [facility] hospital lab) Nasopharyngeal Nasopharyngeal Swab     Status: None   Collection Time: 11/19/19  5:36 PM   Specimen: Nasopharyngeal Swab  Result Value Ref Range Status   SARS Coronavirus 2 NEGATIVE NEGATIVE Final    Comment: (NOTE) SARS-CoV-2 target nucleic acids are NOT DETECTED. The SARS-CoV-2 RNA is generally detectable in upper and lower respiratory specimens during the acute phase of infection. The lowest concentration of SARS-CoV-2 viral copies this assay can detect is 250 copies / mL. A negative result does not preclude SARS-CoV-2 infection and should not be used as the sole basis for treatment or other patient management decisions.  A negative result may occur with improper specimen collection / handling, submission of specimen other than nasopharyngeal swab, presence of viral mutation(s) within the areas targeted by this assay, and inadequate number of viral copies (<250 copies / mL). A negative result must be combined with clinical observations, patient history, and epidemiological information. Fact Sheet for Patients:   StrictlyIdeas.no Fact Sheet for Healthcare Providers: BankingDealers.co.za This test is not yet approved or cleared  by the [place] FDA and has been authorized for detection and/or diagnosis of SARS-CoV-2 by FDA under an Emergency Use Authorization (EUA).  This EUA will remain in effect (meaning this test can be used) for the duration of  the COVID-19 declaration under Section 564(b)(1) of the Act, 21 U.S.C. section 360bbb-3(b)(1), unless the authorization is terminated or revoked sooner. Performed at [facility], [address] [place], [place] [address]       Radiology Studies: DG Chest 2 View  Result Date: 11/19/2019 CLINICAL DATA:  Chest pain and nausea. EXAM: CHEST - 2 VIEW COMPARISON:  Nov 10, 2019 FINDINGS: Mild, diffuse chronic appearing increased lung markings are seen. There is no evidence of acute infiltrate, pleural effusion or pneumothorax. The heart size and mediastinal contours are within normal limits. The visualized skeletal structures are unremarkable. IMPRESSION: No active cardiopulmonary disease. Electronically Signed   By: Virgina Norfolk M.D.   On: 11/19/2019 15:50   CT Chest Wo Contrast  Result Date: 11/19/2019 CLINICAL DATA:  Hemoptysis, chest pain, nausea EXAM: CT CHEST WITHOUT CONTRAST TECHNIQUE: Multidetector CT imaging of the chest was performed following the standard protocol without IV contrast. COMPARISON:  11/19/2019, 05/09/2019 FINDINGS: Cardiovascular: Unenhanced imaging of the heart and great vessels demonstrates no pericardial  effusion. There is moderate atherosclerosis of the aortic arch. There is severe atherosclerosis of the LAD distribution of the coronary vasculature. Normal caliber of the thoracic aorta. Mediastinum/Nodes: No enlarged mediastinal or axillary lymph nodes. Thyroid gland, trachea, and esophagus demonstrate no significant findings. Lungs/Pleura: No acute airspace disease, effusion, or pneumothorax. Upper Abdomen: Partial visualization of a left adrenal mass measuring 7.7 x 5.6 cm. Please refer to prior CT abdomen report 04/24/2019 describing probable left adrenal adenoma. Right renal cyst is partially visualized. The remainder of the upper abdomen is unremarkable. Musculoskeletal: Prior resection of the left eleventh rib related to nephrectomy, with herniation of fat  through the surgical defect unchanged. No acute or destructive bony lesions. Reconstructed images demonstrate no additional findings. IMPRESSION: 1. No acute intrathoracic process. 2. Partial visualization of left adrenal adenoma, please refer to prior abdominal CT report 04/24/2019 for full characterization and recommendation. 3. Aortic Atherosclerosis (ICD10-I70.0). Electronically Signed   By: Randa Ngo M.D.   On: 11/19/2019 16:53     Scheduled Meds: . amLODipine  10 mg Oral Daily  . gabapentin  100 mg Oral TID  . hydrALAZINE  100 mg Oral TID  . insulin aspart  0-15 Units Subcutaneous TID WC  . insulin glargine  26 Units Subcutaneous Daily  . isosorbide mononitrate  30 mg Oral Daily  . pantoprazole  40 mg Oral Daily  . rosuvastatin  10 mg Oral Daily  . tiotropium  18 mcg Inhalation Daily   Continuous Infusions:   LOS: 0 days     Enzo Bi, MD [facility] If 7PM-7AM, please contact night-coverage 11/20/2019, 9:35 PM

## 2019-11-21 ENCOUNTER — Encounter: Payer: Self-pay | Admitting: Hospitalist

## 2019-11-21 ENCOUNTER — Telehealth: Payer: Self-pay | Admitting: Gastroenterology

## 2019-11-21 ENCOUNTER — Encounter
Admission: EM | Disposition: A | Discharge status: Home / Self Care | Payer: Self-pay | Source: Home / Self Care | Attending: Emergency Medicine

## 2019-11-21 ENCOUNTER — Observation Stay: Payer: Medicare PPO | Admitting: Anesthesiology

## 2019-11-21 DIAGNOSIS — I130 Hypertensive heart and chronic kidney disease with heart failure and stage 1 through stage 4 chronic kidney disease, or unspecified chronic kidney disease: Secondary | ICD-10-CM | POA: Diagnosis not present

## 2019-11-21 DIAGNOSIS — K228 Other specified diseases of esophagus: Secondary | ICD-10-CM

## 2019-11-21 DIAGNOSIS — R042 Hemoptysis: Secondary | ICD-10-CM | POA: Diagnosis not present

## 2019-11-21 DIAGNOSIS — J441 Chronic obstructive pulmonary disease with (acute) exacerbation: Secondary | ICD-10-CM | POA: Diagnosis not present

## 2019-11-21 DIAGNOSIS — I5033 Acute on chronic diastolic (congestive) heart failure: Secondary | ICD-10-CM | POA: Diagnosis not present

## 2019-11-21 DIAGNOSIS — N1832 Chronic kidney disease, stage 3b: Secondary | ICD-10-CM | POA: Diagnosis not present

## 2019-11-21 DIAGNOSIS — E1122 Type 2 diabetes mellitus with diabetic chronic kidney disease: Secondary | ICD-10-CM | POA: Diagnosis not present

## 2019-11-21 DIAGNOSIS — E782 Mixed hyperlipidemia: Secondary | ICD-10-CM | POA: Diagnosis not present

## 2019-11-21 DIAGNOSIS — K920 Hematemesis: Secondary | ICD-10-CM | POA: Diagnosis not present

## 2019-11-21 DIAGNOSIS — K2270 Barrett's esophagus without dysplasia: Secondary | ICD-10-CM | POA: Diagnosis not present

## 2019-11-21 DIAGNOSIS — R0789 Other chest pain: Secondary | ICD-10-CM

## 2019-11-21 HISTORY — PX: ESOPHAGOGASTRODUODENOSCOPY (EGD) WITH PROPOFOL: SHX5813

## 2019-11-21 LAB — BASIC METABOLIC PANEL
Anion gap: 8 (ref 5–15)
BUN: 43 mg/dL — ABNORMAL HIGH (ref 8–23)
CO2: 24 mmol/L (ref 22–32)
Calcium: 8.6 mg/dL — ABNORMAL LOW (ref 8.9–10.3)
Chloride: 110 mmol/L (ref 98–111)
Creatinine, Ser: 1.7 mg/dL — ABNORMAL HIGH (ref 0.61–1.24)
GFR calc Af Amer: 48 mL/min — ABNORMAL LOW (ref >60)
GFR calc non Af Amer: 41 mL/min — ABNORMAL LOW (ref >60)
Glucose, Bld: 113 mg/dL — ABNORMAL HIGH (ref 70–99)
Potassium: 3.4 mmol/L — ABNORMAL LOW (ref 3.5–5.1)
Sodium: 142 mmol/L (ref 135–145)

## 2019-11-21 LAB — GLUCOSE, CAPILLARY
Glucose-Capillary: 109 mg/dL — ABNORMAL HIGH (ref 70–99)
Glucose-Capillary: 101 mg/dL — ABNORMAL HIGH (ref 70–99)
Glucose-Capillary: 144 mg/dL — ABNORMAL HIGH (ref 70–99)

## 2019-11-21 LAB — CBC
HCT: 30.7 % — ABNORMAL LOW (ref 39.0–52.0)
Hemoglobin: 10.2 g/dL — ABNORMAL LOW (ref 13.0–17.0)
MCH: 29.1 pg (ref 26.0–34.0)
MCHC: 33.2 g/dL (ref 30.0–36.0)
MCV: 87.7 fL (ref 80.0–100.0)
Platelets: 175 10*3/uL (ref 150–400)
RBC: 3.5 MIL/uL — ABNORMAL LOW (ref 4.22–5.81)
RDW: 13.9 % (ref 11.5–15.5)
WBC: 7.1 10*3/uL (ref 4.0–10.5)
nRBC: 0 % (ref 0.0–0.2)

## 2019-11-21 LAB — VITAMIN B12: Vitamin B-12: 163 pg/mL — ABNORMAL LOW (ref 180–914)

## 2019-11-21 LAB — IRON AND TIBC
Iron: 32 ug/dL — ABNORMAL LOW (ref 45–182)
Saturation Ratios: 10 % — ABNORMAL LOW (ref 17.9–39.5)
TIBC: 316 ug/dL (ref 250–450)
UIBC: 284 ug/dL

## 2019-11-21 LAB — FERRITIN: Ferritin: 13 ng/mL — ABNORMAL LOW (ref 24–336)

## 2019-11-21 LAB — MAGNESIUM: Magnesium: 2.1 mg/dL (ref 1.7–2.4)

## 2019-11-21 SURGERY — ESOPHAGOGASTRODUODENOSCOPY (EGD) WITH PROPOFOL
Anesthesia: General

## 2019-11-21 MED ORDER — SODIUM CHLORIDE 0.9 % IV SOLN
INTRAVENOUS | Status: DC
  Administered 2019-11-21: 1000 mL via INTRAVENOUS

## 2019-11-21 MED ORDER — NAPROXEN 500 MG PO TABS: 500.0000 mg | ORAL_TABLET | Freq: Two times a day (BID) | ORAL | Status: DC | PRN

## 2019-11-21 MED ORDER — PROPOFOL 10 MG/ML IV BOLUS
INTRAVENOUS | Status: AC
  Filled 2019-11-21: qty 20

## 2019-11-21 MED ORDER — PROPOFOL 10 MG/ML IV BOLUS
INTRAVENOUS | Status: DC | PRN
  Administered 2019-11-21: 20 mg via INTRAVENOUS
  Administered 2019-11-21: 50 mg via INTRAVENOUS
  Administered 2019-11-21: 20 mg via INTRAVENOUS
  Administered 2019-11-21: 10 mg via INTRAVENOUS

## 2019-11-21 MED ORDER — VITAMIN B-12 1000 MCG PO TABS
1000.0000 ug | ORAL_TABLET | Freq: Every day | ORAL | Status: DC
Start: 2019-11-21 — End: 2020-10-08

## 2019-11-21 MED ORDER — FUROSEMIDE 80 MG PO TABS: ORAL_TABLET | ORAL | 3 refills | Status: DC

## 2019-11-21 MED ORDER — LORATADINE 10 MG PO TABS: 10.0000 mg | ORAL_TABLET | Freq: Every day | ORAL | Status: DC | PRN

## 2019-11-21 MED ORDER — MUCINEX 600 MG PO TB12
600.0000 mg | ORAL_TABLET | Freq: Two times a day (BID) | ORAL | 0 refills | Status: DC | PRN
Start: 2019-11-21 — End: 2019-12-10

## 2019-11-21 MED ORDER — IPRATROPIUM-ALBUTEROL 0.5-2.5 (3) MG/3ML IN SOLN
3.0000 mL | Freq: Three times a day (TID) | RESPIRATORY_TRACT | Status: DC
  Filled 2019-11-21: qty 3

## 2019-11-21 MED ORDER — SODIUM CHLORIDE 0.9 % IV SOLN
300.0000 mg | Freq: Once | INTRAVENOUS | Status: AC
  Administered 2019-11-21: 300 mg via INTRAVENOUS
  Filled 2019-11-21: qty 15

## 2019-11-21 NOTE — Transfer of Care (Signed)
Immediate Anesthesia Transfer of Care Note  Patient: Larry Callahan  Procedure(s) Performed: ESOPHAGOGASTRODUODENOSCOPY (EGD) WITH PROPOFOL (N/A )  Patient Location: PACU and Endoscopy Unit  Anesthesia Type:General  Level of Consciousness: awake, alert  and oriented  Airway & Oxygen Therapy: Patient Spontanous Breathing  Post-op Assessment: Report given to RN and Post -op Vital signs reviewed and stable  Post vital signs: Reviewed and stable  Last Vitals:  Vitals Value Taken Time  BP 132/68 11/21/19 1359  Temp    Pulse 64 11/21/19 1400  Resp 21 11/21/19 1400  SpO2 95 % 11/21/19 1400  Vitals shown include unvalidated device data.  Last Pain:  Vitals:   11/21/19 1320  TempSrc: Tympanic  PainSc: 0-No pain         Complications: No complications documented.

## 2019-11-21 NOTE — Anesthesia Procedure Notes (Signed)
Date/Time: 11/21/2019 1:44 PM Performed by: Jerrye Noble, CRNA Oxygen Delivery Method: Nasal cannula

## 2019-11-21 NOTE — Discharge Summary (Addendum)
Physician Discharge Summary   Larry Callahan  male DOB: 16-Jan-1956  PNT:[id number]  PCP: [name], NP  Admit date: 11/19/2019 Discharge date: 11/21/2019  Admitted From: home Disposition:  home CODE STATUS: Full code  Discharge Instructions    Diet - low sodium heart healthy   Complete by: As directed    Discharge instructions   Complete by: As directed    We are not sure why you were spitting up blood.  Your chest CT was normal, your EGD down your throat didn't show bleeding.  Please follow up with GI doctor in 2-3 weeks.  Your kidney function improved after your Lasix was stopped in the hospital.  We are holding your Lasix until you see your outpatient doctor.  You are anemic and iron and vitamin B12 deficient.  Please take over-the-counter iron and vitamin B12 supplements.   Dr. Enzo Bi - -   Increase activity slowly   Complete by: As directed        Hospital Course:  For full details, please see H&P, progress notes, consult notes and ancillary notes.  Briefly,  Larry Callahan a 64 y.o.malewith medical history significant ofleft nephrotomy, CKD, COPD, OSA, DM2, HTN, dCHFwho presented after spitting up blood.   # Likely hemoptysis Per ED provider, bloody secretion came up after coughing. CT chest showed no airspace disease. Hgb 11.2 which is baseline. Lung exam benign. Has no other findings to suggest vasculitis. GI consulted for EGD to assess for possible esophagitis, which resulted in normal findings.  Pt stopped "spitting up" blood on the day of discharge and requested to be discharged.   Pt was advised to follow up with GI in 2-3 weeks.  # Hx of COPD # Chronic dyspnea Oxygen 88-90% on [facility], which is acceptable for pt with COPD.  Continued home bronchodilators (Spiriva) and DuoNeb as QID.  # HTN continued home amlodipine, hydralazine, Imdur, metop.  Home lasix was held during hospitalization, and since pt remained  euvolemic and Cr actually improved (from 2.15 on presentation to 1.7 on the day of discharge), lasix was held on discharge pending outpatient followup.  CKD 3b, stable Hx of left nephrotomy Pt was on home lasix 80mg  BID.  Home lasix was held during hospitalization, and since pt remained euvolemic and Cr actually improved (from 2.15 on presentation to 1.7 on the day of discharge), lasix was held on discharge pending outpatient followup.  # Anemia, iron and Vit B12 def Hgb stable at 10's.  Anemia workup revealed iron and Vit B12 def.  Pt was prescribed oral supplements of both.  Pt was advised to follow up with GI in 2-3 weeks.  # DM2 on insulin A1c has been within goal.  continued home Lantus at 26u daily.  Home oral agents were held during hospitalization and resumed after discharge.  # OSA on CPAP  Pt reported compliance with CPAP.  continued CPAP nightly  # Neuropathy continued home gabapentin  # GERD continued PPI   Discharge Diagnoses:  Active Problems:   Hemoptysis    Discharge Instructions:  Allergies as of 11/21/2019      Reactions   Penicillins Rash      Medication List    STOP taking these medications   atorvastatin 10 MG tablet Commonly known as: LIPITOR   predniSONE 50 MG tablet Commonly known as: DELTASONE   sulfamethoxazole-trimethoprim 800-160 MG tablet Commonly known as: BACTRIM DS     TAKE these medications   acetaminophen 325 MG  tablet Commonly known as: TYLENOL Take 2 tablets (650 mg total) by mouth every 4 (four) hours as needed for headache or mild pain.   albuterol 108 (90 Base) MCG/ACT inhaler Commonly known as: VENTOLIN HFA Inhale 2 puffs into the lungs every 6 (six) hours as needed for wheezing or shortness of breath.   amLODipine 10 MG tablet Commonly known as: NORVASC Take 1 tablet (10 mg total) by mouth daily.   ferrous gluconate 324 MG tablet Commonly known as: FERGON Take 1 tablet (324 mg total) by mouth daily with  breakfast.   furosemide 80 MG tablet Commonly known as: LASIX Hold until outpatient PCP followup due to acute kidney injury. What changed:   how much to take  how to take this  when to take this  additional instructions   gabapentin 100 MG capsule Commonly known as: NEURONTIN Take 1 capsule (100 mg total) by mouth in the morning, at noon, and at bedtime.   gemfibrozil 600 MG tablet Commonly known as: LOPID Take 1 tablet (600 mg total) by mouth 2 (two) times daily with a meal.   glimepiride 2 MG tablet Commonly known as: AMARYL Take 1 tablet (2 mg total) by mouth at bedtime.   hydrALAZINE 100 MG tablet Commonly known as: APRESOLINE TAKE 1 TABLET THREE TIMES DAILY   hydrOXYzine 10 MG/5ML syrup Commonly known as: ATARAX Take 5 mLs (10 mg total) by mouth 3 (three) times daily as needed for itching.   insulin glargine (1 Unit Dial) 300 UNIT/ML Solostar Pen Commonly known as: TOUJEO Inject 26 Units into the skin daily.   ipratropium-albuterol 0.5-2.5 (3) MG/3ML Soln Commonly known as: DUONEB Take 3 mLs by nebulization every 6 (six) hours as needed.   isosorbide mononitrate 30 MG 24 hr tablet Commonly known as: IMDUR Take 1 tablet (30 mg total) by mouth daily.   loratadine 10 MG tablet Commonly known as: CLARITIN Take 1 tablet (10 mg total) by mouth daily as needed for allergies.   metoprolol succinate 50 MG 24 hr tablet Commonly known as: TOPROL-XL Take 1 tablet (50 mg total) by mouth at bedtime. Take with or immediately following a meal.   Mucinex 600 MG 12 hr tablet Generic drug: guaiFENesin Take 1 tablet (600 mg total) by mouth 2 (two) times daily as needed. What changed:   when to take this  reasons to take this   naproxen 500 MG tablet Commonly known as: Naprosyn Take 1 tablet (500 mg total) by mouth 2 (two) times daily as needed. What changed:   when to take this  reasons to take this   nitroGLYCERIN 0.4 MG SL tablet Commonly known as:  NITROSTAT Place 1 tablet (0.4 mg total) under the tongue every 5 (five) minutes x 3 doses as needed for chest pain.   NovoFine 32G X 6 MM Misc Generic drug: Insulin Pen Needle To use with Ledbetter injections daily   omeprazole 40 MG capsule Commonly known as: PRILOSEC 1 cap daily What changed: how to take this   potassium chloride SA 20 MEQ tablet Commonly known as: KLOR-CON Take 1 tablet (20 mEq total) by mouth 2 (two) times daily.   rosuvastatin 10 MG tablet Commonly known as: CRESTOR Take 1 tablet (10 mg total) by mouth daily.   sodium chloride 0.65 % Soln nasal spray Commonly known as: OCEAN Place 1 spray into both nostrils as needed for congestion.   Spiriva HandiHaler 18 MCG inhalation capsule Generic drug: tiotropium Place 1 capsule (18 mcg total) into  inhaler and inhale daily.   True Metrix Air Glucose Meter Devi 1 Device by Does not apply route 2 (two) times daily.   True Metrix Blood Glucose Test test strip Generic drug: glucose blood Use as instructed twice daily diag E11.65   True Metrix Level 1 Low Soln Use as directed   TRUEplus Lancets 33G Misc Use as directed twice daily diag E11.65   vitamin B-12 1000 MCG tablet Commonly known as: CYANOCOBALAMIN Take 1 tablet (1,000 mcg total) by mouth daily.        Follow-up Information    [name], NP. Schedule an appointment as soon as possible for a visit in 1 week(s).   Specialty: Family Medicine Contact information: [place] [address] [phone]        [name], MD .   Specialty: Cardiology Contact information: [address] [place] [address] [phone]               Allergies  Allergen Reactions  . Penicillins Rash     The results of significant diagnostics from this hospitalization (including imaging, microbiology, ancillary and laboratory) are listed below for reference.   Consultations:   Procedures/Studies: DG Chest 2  View  Result Date: 11/19/2019 CLINICAL DATA:  Chest pain and nausea. EXAM: CHEST - 2 VIEW COMPARISON:  Nov 10, 2019 FINDINGS: Mild, diffuse chronic appearing increased lung markings are seen. There is no evidence of acute infiltrate, pleural effusion or pneumothorax. The heart size and mediastinal contours are within normal limits. The visualized skeletal structures are unremarkable. IMPRESSION: No active cardiopulmonary disease. Electronically Signed   By: Virgina Norfolk M.D.   On: 11/19/2019 15:50   DG Chest 2 View  Result Date: 11/10/2019 CLINICAL DATA:  Pt to room in wheelchair, states he found a tick on his left leg at 3 pm, removed it and the site was bleeding. He reports a weak right leg, was able to stand to remove pants for examination. Small sore noted on left thigh. EXAM: CHEST - 2 VIEW COMPARISON:  Chest radiograph 08/08/2019, 08/04/2019 FINDINGS: The heart size and mediastinal contours are within normal limits. Chronic coarse bilateral interstitial markings. No new focal consolidation. No pneumothorax or pleural effusion. No acute finding in the visualized skeleton. IMPRESSION: No acute cardiopulmonary findings. Electronically Signed   By: Audie Pinto M.D.   On: 11/10/2019 19:21   CT Chest Wo Contrast  Result Date: 11/19/2019 CLINICAL DATA:  Hemoptysis, chest pain, nausea EXAM: CT CHEST WITHOUT CONTRAST TECHNIQUE: Multidetector CT imaging of the chest was performed following the standard protocol without IV contrast. COMPARISON:  11/19/2019, 05/09/2019 FINDINGS: Cardiovascular: Unenhanced imaging of the heart and great vessels demonstrates no pericardial effusion. There is moderate atherosclerosis of the aortic arch. There is severe atherosclerosis of the LAD distribution of the coronary vasculature. Normal caliber of the thoracic aorta. Mediastinum/Nodes: No enlarged mediastinal or axillary lymph nodes. Thyroid gland, trachea, and esophagus demonstrate no significant findings.  Lungs/Pleura: No acute airspace disease, effusion, or pneumothorax. Upper Abdomen: Partial visualization of a left adrenal mass measuring 7.7 x 5.6 cm. Please refer to prior CT abdomen report 04/24/2019 describing probable left adrenal adenoma. Right renal cyst is partially visualized. The remainder of the upper abdomen is unremarkable. Musculoskeletal: Prior resection of the left eleventh rib related to nephrectomy, with herniation of fat through the surgical defect unchanged. No acute or destructive bony lesions. Reconstructed images demonstrate no additional findings. IMPRESSION: 1. No acute intrathoracic process. 2. Partial  visualization of left adrenal adenoma, please refer to prior abdominal CT report 04/24/2019 for full characterization and recommendation. 3. Aortic Atherosclerosis (ICD10-I70.0). Electronically Signed   By: Randa Ngo M.D.   On: 11/19/2019 16:53      Labs: BNP (last 3 results) Recent Labs    05/10/19 1640 05/19/19 1522 11/10/19 1928  BNP 75.0 143.0* 27.7   Basic Metabolic Panel: Recent Labs  Lab 11/19/19 1451 11/20/19 0508 11/21/19 0538  NA 141 140 142  K 3.3* 3.9 3.4*  CL 109 107 110  CO2 22 24 24   GLUCOSE 171* 243* 113*  BUN 46* 44* 43*  CREATININE 2.15* 1.86* 1.70*  CALCIUM 8.8* 8.9 8.6*  MG  --  2.1 2.1   Liver Function Tests: No results for input(s): AST, ALT, ALKPHOS, BILITOT, PROT, ALBUMIN in the last 168 hours. No results for input(s): LIPASE, AMYLASE in the last 168 hours. No results for input(s): AMMONIA in the last 168 hours. CBC: Recent Labs  Lab 11/19/19 1451 11/20/19 0508 11/21/19 0538  WBC 7.6 6.4 7.1  HGB 11.2* 10.9* 10.2*  HCT 33.6* 33.0* 30.7*  MCV 87.5 86.8 87.7  PLT 199 203 175   Cardiac Enzymes: No results for input(s): CKTOTAL, CKMB, CKMBINDEX, TROPONINI in the last 168 hours. BNP: Invalid input(s): POCBNP CBG: Recent Labs  Lab 11/20/19 1147 11/20/19 1649 11/20/19 2149 11/21/19 0733 11/21/19 1152  GLUCAP 149*  131* 115* 109* 101*   D-Dimer No results for input(s): DDIMER in the last 72 hours. Hgb A1c Recent Labs    11/19/19 1451  HGBA1C 7.1*   Lipid Profile No results for input(s): CHOL, HDL, LDLCALC, TRIG, CHOLHDL, LDLDIRECT in the last 72 hours. Thyroid function studies No results for input(s): TSH, T4TOTAL, T3FREE, THYROIDAB in the last 72 hours.  Invalid input(s): FREET3 Anemia work up Recent Labs    11/21/19 0538 11/21/19 0539  VITAMINB12  --  163*  FERRITIN 13*  --   TIBC 316  --   IRON 32*  --    Urinalysis    Component Value Date/Time   COLORURINE YELLOW (A) 08/08/2019 1657   APPEARANCEUR HAZY (A) 08/08/2019 1657   APPEARANCEUR Cloudy (A) 11/23/2018 1103   LABSPEC 1.012 08/08/2019 1657   LABSPEC 1.005 03/06/2013 0929   PHURINE 5.0 08/08/2019 1657   GLUCOSEU 50 (A) 08/08/2019 1657   GLUCOSEU Negative 03/06/2013 0929   HGBUR NEGATIVE 08/08/2019 1657   [place] 08/08/2019 1657   BILIRUBINUR negative 02/20/2019 1404   BILIRUBINUR Negative 11/23/2018 1103   BILIRUBINUR Negative 03/06/2013 0929   KETONESUR NEGATIVE 08/08/2019 1657   PROTEINUR 100 (A) 08/08/2019 1657   UROBILINOGEN 0.2 02/20/2019 1404   NITRITE NEGATIVE 08/08/2019 1657   LEUKOCYTESUR NEGATIVE 08/08/2019 1657   LEUKOCYTESUR Negative 03/06/2013 0929   Sepsis Labs Invalid input(s): PROCALCITONIN,  WBC,  LACTICIDVEN Microbiology Recent Results (from the past 240 hour(s))  SARS Coronavirus 2 by RT PCR (hospital order, performed in [place] hospital lab) Nasopharyngeal Nasopharyngeal Swab     Status: None   Collection Time: 11/19/19  5:36 PM   Specimen: Nasopharyngeal Swab  Result Value Ref Range Status   SARS Coronavirus 2 NEGATIVE NEGATIVE Final    Comment: (NOTE) SARS-CoV-2 target nucleic acids are NOT DETECTED. The SARS-CoV-2 RNA is generally detectable in upper and lower respiratory specimens during the acute phase of infection. The lowest concentration of SARS-CoV-2 viral copies  this assay can detect is 250 copies / mL. A negative result does not preclude SARS-CoV-2 infection and should  not be used as the sole basis for treatment or other patient management decisions.  A negative result may occur with improper specimen collection / handling, submission of specimen other than nasopharyngeal swab, presence of viral mutation(s) within the areas targeted by this assay, and inadequate number of viral copies (<250 copies / mL). A negative result must be combined with clinical observations, patient history, and epidemiological information. Fact Sheet for Patients:   StrictlyIdeas.no Fact Sheet for Healthcare Providers: BankingDealers.co.za This test is not yet approved or cleared  by the [place] FDA and has been authorized for detection and/or diagnosis of SARS-CoV-2 by FDA under an Emergency Use Authorization (EUA).  This EUA will remain in effect (meaning this test can be used) for the duration of the COVID-19 declaration under Section 564(b)(1) of the Act, 21 U.S.C. section 360bbb-3(b)(1), unless the authorization is terminated or revoked sooner. Performed at [facility], [address] [place], [place] [address]      Total time spend on discharging this patient, including the last patient exam, discussing the hospital stay, instructions for ongoing care as it relates to all pertinent caregivers, as well as preparing the medical discharge records, prescriptions, and/or referrals as applicable, is 35 minutes.    Enzo Bi, MD  [facility] 11/21/2019, 2:47 PM  If 7PM-7AM, please contact night-coverage

## 2019-11-21 NOTE — Progress Notes (Signed)
Consent not signed because patient denies being advised of the proceduce today. Report to endo nurse.

## 2019-11-21 NOTE — Consult Note (Signed)
Larry Darby, MD [address]  [place]  [place], [place] [address]  Main: [phone]  Fax: [phone] Pager: [phone]   Consultation  Referring Provider:     No ref. provider found Primary Care Physician:  [name], NP Primary Gastroenterologist:  Dr. Sherri Sear       Reason for Consultation:     Spitting of blood  Date of Admission:  11/19/2019 Date of Consultation:  11/21/2019         HPI:   Larry Callahan is a 64 y.o. male history of metabolic syndrome, stage III CKD, diastolic heart failure who presented to ER yesterday spitting up blood associated with chest pain.  He underwent CT chest which was unremarkable.  His hemoglobin in fact overall improved compared to 3 months ago from 9.4 to 12.6.  Dropped to 10.2 today, therefore GI is consulted.  Patient has similar admission in 03/2019, underwent upper endoscopy which revealed Barrett's esophagus only.  Colonoscopy was incomplete due to poor prep.  He does have severe iron deficiency and B12 deficiency anemia, received parenteral iron as well as B12 as inpatient.  He was lost to follow-up since discharge.  He is on oral iron at home, not B12  He denies smoking or alcohol use Visits to church regularly NSAIDs: None  Antiplts/Anticoagulants/Anti thrombotics: None  GI Procedures:  EGD and colonoscopy 04/12/2019 - Normal duodenal bulb and second portion of the duodenum. Biopsied. - Normal stomach. Biopsied. - Small hiatal hernia. - Esophagogastric landmarks identified. - Salmon-colored mucosa suspicious for short-segment Barrett's esophagus. Biopsied.  Colonoscopy Poor prep  Past Medical History:  Diagnosis Date  . Allergy   . Coronary artery disease   . Diabetes mellitus without complication ([place] [place])   . Diastolic CHF ([place])   . GERD (gastroesophageal reflux disease)   . Hyperlipidemia   . Hypertension   . Renal insufficiency   . Sleep apnea     Past Surgical History:  Procedure  Laterality Date  . BACK SURGERY    . CARDIAC CATHETERIZATION    . CHOLECYSTECTOMY    . COLONOSCOPY WITH PROPOFOL N/A 04/12/2019   Procedure: COLONOSCOPY WITH PROPOFOL;  Surgeon: [name], MD;  Location: [facility] ENDOSCOPY;  Service: Gastroenterology;  Laterality: N/A;  . ESOPHAGOGASTRODUODENOSCOPY N/A 04/12/2019   Procedure: ESOPHAGOGASTRODUODENOSCOPY (EGD);  Surgeon: [name], MD;  Location: [facility] ENDOSCOPY;  Service: Gastroenterology;  Laterality: N/A;  . FLEXIBLE BRONCHOSCOPY Bilateral 05/17/2019   Procedure: FLEXIBLE BRONCHOSCOPY;  Surgeon: [name], MD;  Location: [name] ORS;  Service: Pulmonary;  Laterality: Bilateral;  . left arm surgery    . nephrectomy Left   . PARATHYROIDECTOMY    . RIGHT HEART CATH N/A 03/29/2019   Procedure: RIGHT HEART CATH;  Surgeon: [name], MD;  Location: [place] CV LAB;  Service: Cardiovascular;  Laterality: N/A;    Prior to Admission medications   Medication Sig Start Date End Date Taking? Authorizing Provider  acetaminophen (TYLENOL) 325 MG tablet Take 2 tablets (650 mg total) by mouth every 4 (four) hours as needed for headache or mild pain. 03/05/19  Yes Gouru, Illene Silver, MD  albuterol (VENTOLIN HFA) 108 (90 Base) MCG/ACT inhaler Inhale 2 puffs into the lungs every 6 (six) hours as needed for wheezing or shortness of breath. 03/05/19  Yes Gouru, Illene Silver, MD  amLODipine (NORVASC) 10 MG tablet Take 1 tablet (10 mg total) by mouth daily. 08/01/19  Yes [name], NP  atorvastatin (LIPITOR) 10 MG  tablet Take 10 mg by mouth daily. 11/09/19  Yes [provider]  Blood Glucose Calibration (TRUE METRIX LEVEL 1) Low SOLN Use as directed 04/04/18  Yes Boscia, Heather E, NP  Blood Glucose Monitoring Suppl (TRUE METRIX AIR GLUCOSE METER) DEVI 1 Device by Does not apply route 2 (two) times daily. 04/13/18  Yes [name], NP  ferrous gluconate (FERGON) 324 MG tablet Take 1 tablet (324 mg total) by mouth daily with  breakfast. 08/01/19  Yes Boscia, Heather E, NP  furosemide (LASIX) 80 MG tablet Take 1 tablet (80 mg total) by mouth 2 (two) times daily. 10/16/19  Yes Boscia, Greer Ee, NP  gabapentin (NEURONTIN) 100 MG capsule Take 1 capsule (100 mg total) by mouth in the morning, at noon, and at bedtime. 08/30/19  Yes Boscia, Greer Ee, NP  gemfibrozil (LOPID) 600 MG tablet Take 1 tablet (600 mg total) by mouth 2 (two) times daily with a meal. 06/27/19  Yes Boscia, Heather E, NP  glimepiride (AMARYL) 2 MG tablet Take 1 tablet (2 mg total) by mouth at bedtime. 08/02/19  Yes Lavera Guise, MD  glucose blood (TRUE METRIX BLOOD GLUCOSE TEST) test strip Use as instructed twice daily diag E11.65 07/30/19  Yes Boscia, Greer Ee, NP  guaiFENesin (MUCINEX) 600 MG 12 hr tablet Take 1 tablet (600 mg total) by mouth 2 (two) times daily. 06/18/19  Yes Lavina Hamman, MD  hydrALAZINE (APRESOLINE) 100 MG tablet TAKE 1 TABLET THREE TIMES DAILY 10/16/19  Yes [name], NP  hydrOXYzine (ATARAX) 10 MG/5ML syrup Take 5 mLs (10 mg total) by mouth 3 (three) times daily as needed for itching. 09/24/19  Yes Sable Feil, PA-C  insulin glargine, 1 Unit Dial, (TOUJEO) 300 UNIT/ML Solostar Pen Inject 26 Units into the skin daily. 10/24/19  Yes Boscia, Heather E, NP  ipratropium-albuterol (DUONEB) 0.5-2.5 (3) MG/3ML SOLN Take 3 mLs by nebulization every 6 (six) hours as needed. 03/08/19  Yes Scarboro, Audie Clear, NP  isosorbide mononitrate (IMDUR) 30 MG 24 hr tablet Take 1 tablet (30 mg total) by mouth daily. 08/02/19  Yes Boscia, Greer Ee, NP  loratadine (CLARITIN) 10 MG tablet Take 1 tablet (10 mg total) by mouth daily. Patient taking differently: Take 10 mg by mouth daily as needed for allergies.  04/01/19  Yes Gouru, Illene Silver, MD  metoprolol succinate (TOPROL-XL) 50 MG 24 hr tablet Take 1 tablet (50 mg total) by mouth at bedtime. Take with or immediately following a meal. 07/10/19  Yes Lavera Guise, MD  naproxen (NAPROSYN) 500 MG tablet Take 1  tablet (500 mg total) by mouth 2 (two) times daily with a meal. 09/24/19  Yes Sable Feil, PA-C  NOVOFINE 32G X 6 MM MISC To use with Evans injections daily 12/13/18  Yes Boscia, Greer Ee, NP  omeprazole (PRILOSEC) 40 MG capsule 1 cap daily Patient taking differently: Take 40 mg by mouth daily.  07/10/19  Yes Lavera Guise, MD  potassium chloride SA (KLOR-CON) 20 MEQ tablet Take 1 tablet (20 mEq total) by mouth 2 (two) times daily. 08/30/19  Yes [name], NP  predniSONE (DELTASONE) 50 MG tablet Take 1 tablet (50 mg total) by mouth daily with breakfast. 11/10/19  Yes Cuthriell, Charline Bills, PA-C  rosuvastatin (CRESTOR) 10 MG tablet Take 1 tablet (10 mg total) by mouth daily. 08/23/19 11/21/19 Yes Gollan, Kathlene November, MD  sodium chloride (OCEAN) 0.65 % SOLN nasal spray Place 1 spray into both nostrils as needed  for congestion. 06/18/19  Yes Lavina Hamman, MD  tiotropium (SPIRIVA HANDIHALER) 18 MCG inhalation capsule Place 1 capsule (18 mcg total) into inhaler and inhale daily. 08/07/19 11/19/19 Yes [name], NP  TRUEplus Lancets 33G MISC Use as directed twice daily diag E11.65 07/30/19  Yes Boscia, Greer Ee, NP  nitroGLYCERIN (NITROSTAT) 0.4 MG SL tablet Place 1 tablet (0.4 mg total) under the tongue every 5 (five) minutes x 3 doses as needed for chest pain. 05/24/19   Loletha Grayer, MD  sulfamethoxazole-trimethoprim (BACTRIM DS) 800-160 MG tablet Take 1 tablet by mouth 2 (two) times daily. Patient not taking: Reported on 11/19/2019 09/26/19   [name], NP    Current Facility-Administered Medications:  .  0.9 %  sodium chloride infusion, , Intravenous, Continuous, Attikus Bartoszek, Tally Due, MD, Last Rate: 20 mL/hr at 11/21/19 1343, Restarted at 11/21/19 1355 .  [MAR Hold] acetaminophen (TYLENOL) tablet 1,000 mg, 1,000 mg, Oral, Q8H PRN, Enzo Bi, MD .  Doug Sou Hold] alum & mag hydroxide-simeth (MAALOX/MYLANTA) 200-200-20 MG/5ML suspension 15 mL, 15 mL, Oral, Q6H PRN, Enzo Bi, MD .  Doug Sou  Hold] amLODipine (NORVASC) tablet 10 mg, 10 mg, Oral, Daily, Enzo Bi, MD, 10 mg at 11/21/19 0941 .  [MAR Hold] calcium carbonate (TUMS - dosed in mg elemental calcium) chewable tablet 200 mg of elemental calcium, 1 tablet, Oral, TID PRN, Enzo Bi, MD .  Doug Sou Hold] docusate sodium (COLACE) capsule 100 mg, 100 mg, Oral, BID PRN, Enzo Bi, MD .  Doug Sou Hold] gabapentin (NEURONTIN) capsule 100 mg, 100 mg, Oral, TID, Enzo Bi, MD, 100 mg at 11/21/19 0941 .  [MAR Hold] guaiFENesin-dextromethorphan (ROBITUSSIN DM) 100-10 MG/5ML syrup 10 mL, 10 mL, Oral, Q6H PRN, Enzo Bi, MD .  Doug Sou Hold] hydrALAZINE (APRESOLINE) tablet 100 mg, 100 mg, Oral, TID, Enzo Bi, MD, 100 mg at 11/21/19 0940 .  [MAR Hold] insulin aspart (novoLOG) injection 0-15 Units, 0-15 Units, Subcutaneous, TID WC, Enzo Bi, MD, 2 Units at 11/20/19 1723 .  [MAR Hold] insulin glargine (LANTUS) injection 26 Units, 26 Units, Subcutaneous, Daily, Enzo Bi, MD, 26 Units at 11/21/19 0947 .  [MAR Hold] ipratropium-albuterol (DUONEB) 0.5-2.5 (3) MG/3ML nebulizer solution 3 mL, 3 mL, Nebulization, TID, Enzo Bi, MD .  Doug Sou Hold] isosorbide mononitrate (IMDUR) 24 hr tablet 30 mg, 30 mg, Oral, Daily, Enzo Bi, MD, 30 mg at 11/21/19 0940 .  [MAR Hold] ondansetron (ZOFRAN) injection 4 mg, 4 mg, Intravenous, Q6H PRN, Enzo Bi, MD .  Doug Sou Hold] ondansetron (ZOFRAN-ODT) disintegrating tablet 4 mg, 4 mg, Oral, Q8H PRN, Enzo Bi, MD .  Doug Sou Hold] pantoprazole (PROTONIX) EC tablet 40 mg, 40 mg, Oral, Daily, Enzo Bi, MD, 40 mg at 11/21/19 0941 .  [MAR Hold] polyethylene glycol (MIRALAX / GLYCOLAX) packet 17 g, 17 g, Oral, BID PRN, Enzo Bi, MD .  Doug Sou Hold] rosuvastatin (CRESTOR) tablet 10 mg, 10 mg, Oral, Daily, Enzo Bi, MD, 10 mg at 11/21/19 0941 .  [MAR Hold] tiotropium (SPIRIVA) inhalation capsule ([name] use ONLY) 18 mcg, 18 mcg, Inhalation, Daily, Enzo Bi, MD, 18 mcg at 11/21/19 1025   Family History  Problem Relation Age of Onset  .  Diabetes Mother   . Lung cancer Father   . Diabetes Sister   . Hypertension Sister   . Heart disease Sister   . Cancer Sister   . Bone cancer Brother      Social History   Tobacco Use  . Smoking status: Former Smoker    Types:  Cigarettes  . Smokeless tobacco: Former Systems developer    Types: Secondary school teacher  . Vaping Use: Never used  Substance Use Topics  . Alcohol use: No  . Drug use: No    Allergies as of 11/19/2019 - Review Complete 11/19/2019  Allergen Reaction Noted  . Penicillins Rash 07/20/2017    Review of Systems:    All systems reviewed and negative except where noted in HPI.   Physical Exam:  Vital signs in last 24 hours: Temp:  [96.8 F (36 C)-98.2 F (36.8 C)] 96.8 F (36 C) (06/10 1400) Pulse Rate:  [50-66] 66 (06/10 1400) Resp:  [12-22] 16 (06/10 1400) BP: (132-172)/(65-76) 132/68 (06/10 1400) SpO2:  [95 %-99 %] 95 % (06/10 1400) Weight:  [998 kg] 113 kg (06/10 1320)   General:   Pleasant, cooperative in NAD Head:  Normocephalic and atraumatic. Eyes:   No icterus.   Conjunctiva pink. PERRLA. Ears:  Normal auditory acuity. Neck:  Supple; no masses or thyroidomegaly Lungs: Respirations even and unlabored. Lungs clear to auscultation bilaterally.   No wheezes, crackles, or rhonchi.  Heart:  Regular rate and rhythm;  Without murmur, clicks, rubs or gallops Abdomen:  Soft, nondistended, nontender. Normal bowel sounds. No appreciable masses or hepatomegaly.  No rebound or guarding.  Rectal:  Not performed. Msk:  Symmetrical without gross deformities.  Strength generalized weakness Extremities:  Without edema, cyanosis or clubbing. Neurologic:  Alert and oriented x3;  grossly normal neurologically. Skin:  Intact without significant lesions or rashes. Psych:  Alert and cooperative. Normal affect.  LAB RESULTS: CBC Latest Ref Rng & Units 11/21/2019 11/20/2019 11/19/2019  WBC 4.0 - 10.5 K/uL 7.1 6.4 7.6  Hemoglobin 13.0 - 17.0 g/dL 10.2(L) 10.9(L) 11.2(L)    Hematocrit 39 - 52 % 30.7(L) 33.0(L) 33.6(L)  Platelets 150 - 400 K/uL 175 203 199    BMET BMP Latest Ref Rng & Units 11/21/2019 11/20/2019 11/19/2019  Glucose 70 - 99 mg/dL 113(H) 243(H) 171(H)  BUN 8 - 23 mg/dL 43(H) 44(H) 46(H)  Creatinine 0.61 - 1.24 mg/dL 1.70(H) 1.86(H) 2.15(H)  BUN/Creat Ratio 10 - 24 - - -  Sodium 135 - 145 mmol/L 142 140 141  Potassium 3.5 - 5.1 mmol/L 3.4(L) 3.9 3.3(L)  Chloride 98 - 111 mmol/L 110 107 109  CO2 22 - 32 mmol/L _0 Calcium 8.9 - 10.3 mg/dL 8.6(L) 8.9 8.8(L)    LFT Hepatic Function Latest Ref Rng & Units 11/10/2019 08/08/2019 08/04/2019  Total Protein 6.5 - 8.1 g/dL 8.0 7.1 7.1  Albumin 3.5 - 5.0 g/dL 3.8 2.8(L) 3.1(L)  AST 15 - 41 U/L _1 ALT 0 - 44 U/L _2 Alk Phosphatase 38 - 126 U/L 94 72 80  Total Bilirubin 0.3 - 1.2 mg/dL 0.6 0.5 0.7  Bilirubin, Direct 0.0 - 0.2 mg/dL - 0.1 -     STUDIES: DG Chest 2 View  Result Date: 11/19/2019 CLINICAL DATA:  Chest pain and nausea. EXAM: CHEST - 2 VIEW COMPARISON:  Nov 10, 2019 FINDINGS: Mild, diffuse chronic appearing increased lung markings are seen. There is no evidence of acute infiltrate, pleural effusion or pneumothorax. The heart size and mediastinal contours are within normal limits. The visualized skeletal structures are unremarkable. IMPRESSION: No active cardiopulmonary disease. Electronically Signed   By: Virgina Norfolk M.D.   On: 11/19/2019 15:50   CT Chest Wo Contrast  Result Date: 11/19/2019 CLINICAL DATA:  Hemoptysis, chest pain, nausea EXAM: CT CHEST WITHOUT CONTRAST  TECHNIQUE: Multidetector CT imaging of the chest was performed following the standard protocol without IV contrast. COMPARISON:  11/19/2019, 05/09/2019 FINDINGS: Cardiovascular: Unenhanced imaging of the heart and great vessels demonstrates no pericardial effusion. There is moderate atherosclerosis of the aortic arch. There is severe atherosclerosis of the LAD distribution of the coronary vasculature.  Normal caliber of the thoracic aorta. Mediastinum/Nodes: No enlarged mediastinal or axillary lymph nodes. Thyroid gland, trachea, and esophagus demonstrate no significant findings. Lungs/Pleura: No acute airspace disease, effusion, or pneumothorax. Upper Abdomen: Partial visualization of a left adrenal mass measuring 7.7 x 5.6 cm. Please refer to prior CT abdomen report 04/24/2019 describing probable left adrenal adenoma. Right renal cyst is partially visualized. The remainder of the upper abdomen is unremarkable. Musculoskeletal: Prior resection of the left eleventh rib related to nephrectomy, with herniation of fat through the surgical defect unchanged. No acute or destructive bony lesions. Reconstructed images demonstrate no additional findings. IMPRESSION: 1. No acute intrathoracic process. 2. Partial visualization of left adrenal adenoma, please refer to prior abdominal CT report 04/24/2019 for full characterization and recommendation. 3. Aortic Atherosclerosis (ICD10-I70.0). Electronically Signed   By: Randa Ngo M.D.   On: 11/19/2019 16:53      Impression / Plan:   Larry Callahan is a 64 y.o. male with metabolic syndrome, CHF, stage III CKD, iron deficiency anemia of unclear etiology is admitted with spitting of blood.  CT chest is negative, pulmonary etiologies ruled out.  Patient had upper endoscopy in the past which revealed small hiatal hernia and short segment Barrett's esophagus.    Due to recurrent episodes of possible hematemesis, recommend repeat EGD If EGD is unremarkable, recommend colonoscopy with 2-day prep.  Patient is wanting to go home after EGD Recommend Protonix 40 mg p.o. twice daily long-term Reiterated to the patient regarding importance of close outpatient follow-up with both gastroenterology as well as hematology  Persistent iron deficiency, recommend parenteral iron Check vitamin B12 levels  Thank you for involving me in the care of this patient.      LOS:  0 days   Sherri Sear, MD  11/21/2019, 2:01 PM   Note: This dictation was prepared with Dragon dictation along with smaller phrase technology. Any transcriptional errors that result from this process are unintentional.

## 2019-11-21 NOTE — Progress Notes (Signed)
Pt left for Endo in hospital bed, hand off completed

## 2019-11-21 NOTE — Progress Notes (Signed)
Patient scheduled for EGD today Tolerated procedure will - see full report  Report called to RN caring for patient AVSS - see Epic for full details

## 2019-11-21 NOTE — Anesthesia Preprocedure Evaluation (Signed)
Anesthesia Evaluation  Patient identified by MRN, date of birth, ID band Patient awake    Reviewed: Allergy & Precautions, H&P , NPO status , Patient's Chart, lab work & pertinent test results  History of Anesthesia Complications Negative for: history of anesthetic complications  Airway Mallampati: III  TM Distance: <3 FB Neck ROM: limited    Dental  (+) Chipped, Poor Dentition, Missing   Pulmonary shortness of breath, sleep apnea , pneumonia, COPD, former smoker,    Pulmonary exam normal        Cardiovascular hypertension, + CAD and +CHF  Normal cardiovascular exam     Neuro/Psych PSYCHIATRIC DISORDERS  Neuromuscular disease    GI/Hepatic Neg liver ROS, GERD  Medicated and Controlled,  Endo/Other  diabetes, Type 2  Renal/GU Renal disease  negative genitourinary   Musculoskeletal   Abdominal   Peds  Hematology negative hematology ROS (+)   Anesthesia Other Findings Patient is NPO appropriate and reports no nausea or vomiting today.  No spitting up of blood today.  Past Medical History: No date: Allergy No date: Coronary artery disease No date: Diabetes mellitus without complication ([facility]) No date: Diastolic CHF ([facility]) No date: GERD (gastroesophageal reflux disease) No date: Hyperlipidemia No date: Hypertension No date: Renal insufficiency No date: Sleep apnea  Past Surgical History: No date: BACK SURGERY No date: CARDIAC CATHETERIZATION No date: CHOLECYSTECTOMY 04/12/2019: COLONOSCOPY WITH PROPOFOL; N/A     Comment:  Procedure: COLONOSCOPY WITH PROPOFOL;  Surgeon: [name], MD;  Location: [name] ENDOSCOPY;  Service:               Gastroenterology;  Laterality: N/A; 04/12/2019: ESOPHAGOGASTRODUODENOSCOPY; N/A     Comment:  Procedure: ESOPHAGOGASTRODUODENOSCOPY (EGD);  Surgeon:               [name], MD;  Location: [facility] ENDOSCOPY;                Service: Gastroenterology;   Laterality: N/A; 05/17/2019: FLEXIBLE BRONCHOSCOPY; Bilateral     Comment:  Procedure: FLEXIBLE BRONCHOSCOPY;  Surgeon: [name], MD;  Location: [name] ORS;  Service: Pulmonary;                Laterality: Bilateral; No date: left arm surgery No date: nephrectomy; Left No date: PARATHYROIDECTOMY 03/29/2019: RIGHT HEART CATH; N/A     Comment:  Procedure: RIGHT HEART CATH;  Surgeon: [name], MD;  Location: [place] CV LAB;  Service:               Cardiovascular;  Laterality: N/A;  BMI    Body Mass Index: 34.75 kg/m      Reproductive/Obstetrics negative OB ROS                             Anesthesia Physical Anesthesia Plan  ASA: IV  Anesthesia Plan: General   Post-op Pain Management:    Induction: Intravenous  PONV Risk Score and Plan: Propofol infusion and TIVA  Airway Management Planned: Natural Airway and Nasal Cannula  Additional Equipment:   Intra-op Plan:   Post-operative Plan:   Informed Consent: I have reviewed the patients History and Physical, chart, labs and discussed the procedure including the risks, benefits  and alternatives for the proposed anesthesia with the patient or authorized representative who has indicated his/her understanding and acceptance.     Dental Advisory Given  Plan Discussed with: Anesthesiologist, CRNA and Surgeon  Anesthesia Plan Comments: (Patient consented for risks of anesthesia including but not limited to:  - adverse reactions to medications - risk of intubation if required - damage to eyes, teeth, lips or other oral mucosa - nerve damage due to positioning  - sore throat or hoarseness - Damage to heart, brain, nerves, lungs, other parts of body or loss of life  Patient voiced understanding.)        Anesthesia Quick Evaluation

## 2019-11-21 NOTE — Anesthesia Postprocedure Evaluation (Signed)
Anesthesia Post Note  Patient: Larry Callahan  Procedure(s) Performed: ESOPHAGOGASTRODUODENOSCOPY (EGD) WITH PROPOFOL (N/A )  Patient location during evaluation: Endoscopy Anesthesia Type: General Level of consciousness: awake and alert Pain management: pain level controlled Vital Signs Assessment: post-procedure vital signs reviewed and stable Respiratory status: spontaneous breathing, nonlabored ventilation and respiratory function stable Cardiovascular status: blood pressure returned to baseline and stable Postop Assessment: no apparent nausea or vomiting Anesthetic complications: no   No complications documented.   Last Vitals:  Vitals:   11/21/19 1410 11/21/19 1418  BP: 138/71   Pulse: (!) 53 (!) 54  Resp: 13 15  Temp:    SpO2: 96% 96%    Last Pain:  Vitals:   11/21/19 1400  TempSrc: Tympanic  PainSc:                  Alphonsus Sias

## 2019-11-21 NOTE — Progress Notes (Signed)
Pt discharged home as ordered, discharged instruction given and explained to pt and pt stated understanding, Pt denies any pain or any other question at this time. Pt taken in wheelchair to the front door and assisted to a front car. Family member denies any question.

## 2019-11-21 NOTE — Telephone Encounter (Signed)
LVM for patient to call Office to schedule hospital F/U. Provider was informed.

## 2019-11-21 NOTE — Op Note (Signed)
[facility] Gastroenterology Patient Name: Larry Callahan Procedure Date: 11/21/2019 12:53 PM MRN: [id number] Account #: [id number] Date of Birth: [birth year] Admit Type: Inpatient Age: 64 Room: [facility] ENDO ROOM 1 Gender: Male Note Status: Finalized Procedure:             Upper GI endoscopy Indications:           Suspected upper gastrointestinal bleeding, spitting up                         blood, Chest pain (non cardiac) Providers:             [name] MD, MD Referring MD:          [name], MD (Referring MD) Medicines:             Monitored Anesthesia Care Complications:         No immediate complications. Estimated blood loss: None. Procedure:             Pre-Anesthesia Assessment:                        - Prior to the procedure, a History and Physical was                         performed, and patient medications and allergies were                         reviewed. The patient is competent. The risks and                         benefits of the procedure and the sedation options and                         risks were discussed with the patient. All questions                         were answered and informed consent was obtained.                         Patient identification and proposed procedure were                         verified by the physician, the nurse, the                         anesthesiologist, the anesthetist and the technician                         in the pre-procedure area in the procedure room in the                         endoscopy suite. Mental Status Examination: alert and                         oriented. Airway Examination: normal oropharyngeal                         airway and neck mobility. Respiratory Examination:  clear to auscultation. CV Examination: normal.                         Prophylactic Antibiotics: The patient does not require                         prophylactic antibiotics. Prior  Anticoagulants: The                         patient has taken no previous anticoagulant or                         antiplatelet agents. ASA Grade Assessment: III - A                         patient with severe systemic disease. After reviewing                         the risks and benefits, the patient was deemed in                         satisfactory condition to undergo the procedure. The                         anesthesia plan was to use monitored anesthesia care                         (MAC). Immediately prior to administration of                         medications, the patient was re-assessed for adequacy                         to receive sedatives. The heart rate, respiratory                         rate, oxygen saturations, blood pressure, adequacy of                         pulmonary ventilation, and response to care were                         monitored throughout the procedure. The physical                         status of the patient was re-assessed after the                         procedure.                        After obtaining informed consent, the endoscope was                         passed under direct vision. Throughout the procedure,                         the patient's blood pressure, pulse, and oxygen  saturations were monitored continuously. The Endoscope                         was introduced through the mouth, and advanced to the                         second part of duodenum. The upper GI endoscopy was                         accomplished without difficulty. The patient tolerated                         the procedure well. Findings:      The duodenal bulb and second portion of the duodenum were normal.      The entire examined stomach was normal.      The cardia and gastric fundus were normal on retroflexion.      The Z-line was irregular.      The examined esophagus was normal. Impression:            - Normal duodenal bulb and  second portion of the                         duodenum.                        - Normal stomach.                        - Z-line irregular.                        - Normal esophagus.                        - No specimens collected. Recommendation:        - Return patient to hospital ward for possible                         discharge same day.                        - Resume previous diet today.                        - Continue present medications.                        - Return to GI clinic in 2 weeks.                        - Refer to an oncologist at appointment to be                         scheduled. Procedure Code(s):     --- Professional ---                        [phone], Esophagogastroduodenoscopy, flexible,                         transoral; diagnostic, including collection of  specimen(s) by brushing or washing, when performed                         (separate procedure) Diagnosis Code(s):     --- Professional ---                        K22.8, Other specified diseases of esophagus                        R07.89, Other chest pain CPT copyright 2019 American Medical Association. All rights reserved. The codes documented in this report are preliminary and upon coder review may  be revised to meet current compliance requirements. Dr. Ulyess Mort [name] MD, MD 11/21/2019 1:58:57 PM This report has been signed electronically. Number of Addenda: 0 Note Initiated On: 11/21/2019 12:53 PM Estimated Blood Loss:  Estimated blood loss: none.      [facility]

## 2019-11-22 ENCOUNTER — Encounter: Payer: Self-pay | Admitting: Gastroenterology

## 2019-11-22 ENCOUNTER — Telehealth: Payer: Self-pay

## 2019-11-22 ENCOUNTER — Telehealth: Payer: Self-pay | Admitting: Gastroenterology

## 2019-11-22 NOTE — Telephone Encounter (Signed)
Larry Landsman, MD  Larry Callahan, Larry Callahan  Please call patient next week to schedule follow-up to see me in 2 to 3 weeks Dx:Iron deficiency anemia, hospital follow-up   RV

## 2019-11-22 NOTE — Telephone Encounter (Signed)
LVM to call our office for an appointment.

## 2019-11-22 NOTE — Telephone Encounter (Signed)
Lmom to confirm and screen for 11-26-19 ov. 

## 2019-11-25 ENCOUNTER — Ambulatory Visit: Payer: Medicare PPO | Admitting: Nurse Practitioner

## 2019-11-26 ENCOUNTER — Ambulatory Visit (INDEPENDENT_AMBULATORY_CARE_PROVIDER_SITE_OTHER): Payer: Medicare PPO | Admitting: Nurse Practitioner

## 2019-11-26 ENCOUNTER — Ambulatory Visit: Payer: Medicare PPO | Admitting: Nurse Practitioner

## 2019-11-26 ENCOUNTER — Telehealth: Payer: Self-pay | Admitting: Gastroenterology

## 2019-11-26 ENCOUNTER — Encounter: Payer: Self-pay | Admitting: Nurse Practitioner

## 2019-11-26 ENCOUNTER — Other Ambulatory Visit: Payer: Self-pay

## 2019-11-26 VITALS — Ht 71.0 in

## 2019-11-26 DIAGNOSIS — E1142 Type 2 diabetes mellitus with diabetic polyneuropathy: Secondary | ICD-10-CM

## 2019-11-26 DIAGNOSIS — Z1211 Encounter for screening for malignant neoplasm of colon: Secondary | ICD-10-CM | POA: Diagnosis not present

## 2019-11-26 DIAGNOSIS — R042 Hemoptysis: Secondary | ICD-10-CM

## 2019-11-26 DIAGNOSIS — Z09 Encounter for follow-up examination after completed treatment for conditions other than malignant neoplasm: Secondary | ICD-10-CM | POA: Diagnosis not present

## 2019-11-26 DIAGNOSIS — E876 Hypokalemia: Secondary | ICD-10-CM | POA: Diagnosis not present

## 2019-11-26 DIAGNOSIS — D509 Iron deficiency anemia, unspecified: Secondary | ICD-10-CM

## 2019-11-26 MED ORDER — GABAPENTIN 100 MG PO CAPS: 100.0000 mg | ORAL_CAPSULE | Freq: Three times a day (TID) | ORAL | 1 refills | Status: DC

## 2019-11-26 MED ORDER — POTASSIUM CHLORIDE CRYS ER 20 MEQ PO TBCR: 20.0000 meq | EXTENDED_RELEASE_TABLET | Freq: Two times a day (BID) | ORAL | 1 refills | Status: DC

## 2019-11-26 NOTE — Telephone Encounter (Signed)
I spoke with him today and made new referral to see Dr. Barb Merino for colonoscopy.

## 2019-11-26 NOTE — Telephone Encounter (Signed)
-----   Message from Royston Bake sent at 11/21/2019  5:02 PM EDT ----- Regarding: RE: Hospital follow-up LVM for patient to call for F/U ----- Message ----- From: [name], MD Sent: 11/21/2019   2:05 PM EDT To: Haynes Hoehn Subject: Hospital follow-up                             Please call patient next week to schedule follow-up to see me in 2 to 3 weeksDx:Iron deficiency anemia, hospital follow-up  RV

## 2019-11-26 NOTE — Telephone Encounter (Signed)
LVM for patient to call office to schedule appt/..Referred by Leretha Pol

## 2019-11-26 NOTE — Progress Notes (Signed)
[facility] [place], [place] [address]  Internal MEDICINE  Telephone Visit  Patient Name: Larry Callahan  [id number]  [id number]  Date of Service: 11/26/2019   This is transitional care visit after hospitalization.   I connected with the patient at 2:28pm by telephone and verified the patients identity using two identifiers.   I discussed the limitations, risks, security and privacy concerns of performing an evaluation and management service by telephone and the availability of in person appointments. I also discussed with the patient that there may be a patient responsible charge related to the service.  The patient expressed understanding and agrees to proceed.    Chief Complaint  Patient presents with  . Telephone Screen  . Telephone Assessment  . Hospitalization Follow-up  . Hyperlipidemia  . Diabetes  . Hypertension  . Follow-up    check on fluid pills     The patient has been contacted via telephone for follow up visit due to concerns for spread of novel coronavirus. He was hospitalized from 11/19/2019 through 11/21/2019 due to hemoptysis. He did have upper endoscopy. This showed - Normal duodenal bulb and second portion of the duodenum. - Normal stomach, - Z-line irregular, - Normal esophagus. CT scan of the chest showed moderate aortic atherosclerosis without other acute abnormalities. He is seeing Dr. Rockey Situ, cardiology and will see him later this month. The patient states that they wanted him to have colonoscopy while in the hospital, but they were unable to get this done during his hospital stay. It has been recommended he see GI specialist in two weeks for further evaluation and treatment. The patient states that he does need new prescriptions for both his gabapentin and potassium supplements. There was some question as to whether or not he should be continuing his furosemide. He states that without his fluid pill, his ankles and feet get too  swollen and they hurt. According to discharge paperwork, this medication was not discontinued.       Current Medication: Outpatient Encounter Medications as of 11/26/2019  Medication Sig  . acetaminophen (TYLENOL) 325 MG tablet Take 2 tablets (650 mg total) by mouth every 4 (four) hours as needed for headache or mild pain.  Marland Kitchen albuterol (VENTOLIN HFA) 108 (90 Base) MCG/ACT inhaler Inhale 2 puffs into the lungs every 6 (six) hours as needed for wheezing or shortness of breath.  Marland Kitchen amLODipine (NORVASC) 10 MG tablet Take 1 tablet (10 mg total) by mouth daily.  . Blood Glucose Calibration (TRUE METRIX LEVEL 1) Low SOLN Use as directed  . Blood Glucose Monitoring Suppl (TRUE METRIX AIR GLUCOSE METER) DEVI 1 Device by Does not apply route 2 (two) times daily.  . ferrous gluconate (FERGON) 324 MG tablet Take 1 tablet (324 mg total) by mouth daily with breakfast.  . furosemide (LASIX) 80 MG tablet Hold until outpatient PCP followup due to acute kidney injury.  Marland Kitchen gabapentin (NEURONTIN) 100 MG capsule Take 1 capsule (100 mg total) by mouth in the morning, at noon, and at bedtime.  Marland Kitchen gemfibrozil (LOPID) 600 MG tablet Take 1 tablet (600 mg total) by mouth 2 (two) times daily with a meal.  . glimepiride (AMARYL) 2 MG tablet Take 1 tablet (2 mg total) by mouth at bedtime.  Marland Kitchen glucose blood (TRUE METRIX BLOOD GLUCOSE TEST) test strip Use as instructed twice daily diag E11.65  . guaiFENesin (MUCINEX) 600 MG 12 hr tablet Take 1 tablet (600 mg total) by mouth 2 (two) times daily as  needed.  . hydrALAZINE (APRESOLINE) 100 MG tablet TAKE 1 TABLET THREE TIMES DAILY  . hydrOXYzine (ATARAX) 10 MG/5ML syrup Take 5 mLs (10 mg total) by mouth 3 (three) times daily as needed for itching.  . insulin glargine, 1 Unit Dial, (TOUJEO) 300 UNIT/ML Solostar Pen Inject 26 Units into the skin daily.  Marland Kitchen ipratropium-albuterol (DUONEB) 0.5-2.5 (3) MG/3ML SOLN Take 3 mLs by nebulization every 6 (six) hours as needed.  . isosorbide  mononitrate (IMDUR) 30 MG 24 hr tablet Take 1 tablet (30 mg total) by mouth daily.  Marland Kitchen loratadine (CLARITIN) 10 MG tablet Take 1 tablet (10 mg total) by mouth daily as needed for allergies.  . metoprolol succinate (TOPROL-XL) 50 MG 24 hr tablet Take 1 tablet (50 mg total) by mouth at bedtime. Take with or immediately following a meal.  . naproxen (NAPROSYN) 500 MG tablet Take 1 tablet (500 mg total) by mouth 2 (two) times daily as needed.  . nitroGLYCERIN (NITROSTAT) 0.4 MG SL tablet Place 1 tablet (0.4 mg total) under the tongue every 5 (five) minutes x 3 doses as needed for chest pain.  Marland Kitchen NOVOFINE 32G X 6 MM MISC To use with Alda injections daily  . omeprazole (PRILOSEC) 40 MG capsule 1 cap daily (Patient taking differently: Take 40 mg by mouth daily. )  . potassium chloride SA (KLOR-CON) 20 MEQ tablet Take 1 tablet (20 mEq total) by mouth 2 (two) times daily.  . sodium chloride (OCEAN) 0.65 % SOLN nasal spray Place 1 spray into both nostrils as needed for congestion.  . TRUEplus Lancets 33G MISC Use as directed twice daily diag E11.65  . vitamin B-12 (CYANOCOBALAMIN) 1000 MCG tablet Take 1 tablet (1,000 mcg total) by mouth daily.  . [DISCONTINUED] gabapentin (NEURONTIN) 100 MG capsule Take 1 capsule (100 mg total) by mouth in the morning, at noon, and at bedtime.  . [DISCONTINUED] potassium chloride SA (KLOR-CON) 20 MEQ tablet Take 1 tablet (20 mEq total) by mouth 2 (two) times daily.  . rosuvastatin (CRESTOR) 10 MG tablet Take 1 tablet (10 mg total) by mouth daily.  Marland Kitchen tiotropium (SPIRIVA HANDIHALER) 18 MCG inhalation capsule Place 1 capsule (18 mcg total) into inhaler and inhale daily.   No facility-administered encounter medications on file as of 11/26/2019.    Surgical History: Past Surgical History:  Procedure Laterality Date  . BACK SURGERY    . CARDIAC CATHETERIZATION    . CHOLECYSTECTOMY    . COLONOSCOPY WITH PROPOFOL N/A 04/12/2019   Procedure: COLONOSCOPY WITH PROPOFOL;  Surgeon:  [name], MD;  Location: [facility] ENDOSCOPY;  Service: Gastroenterology;  Laterality: N/A;  . ESOPHAGOGASTRODUODENOSCOPY N/A 04/12/2019   Procedure: ESOPHAGOGASTRODUODENOSCOPY (EGD);  Surgeon: [name], MD;  Location: [facility] ENDOSCOPY;  Service: Gastroenterology;  Laterality: N/A;  . ESOPHAGOGASTRODUODENOSCOPY (EGD) WITH PROPOFOL N/A 11/21/2019   Procedure: ESOPHAGOGASTRODUODENOSCOPY (EGD) WITH PROPOFOL;  Surgeon: [name], MD;  Location: [facility] ENDOSCOPY;  Service: Gastroenterology;  Laterality: N/A;  . FLEXIBLE BRONCHOSCOPY Bilateral 05/17/2019   Procedure: FLEXIBLE BRONCHOSCOPY;  Surgeon: [name], MD;  Location: [name] ORS;  Service: Pulmonary;  Laterality: Bilateral;  . left arm surgery    . nephrectomy Left   . PARATHYROIDECTOMY    . RIGHT HEART CATH N/A 03/29/2019   Procedure: RIGHT HEART CATH;  Surgeon: [name], MD;  Location: [place] CV LAB;  Service: Cardiovascular;  Laterality: N/A;    Medical History: Past Medical History:  Diagnosis Date  . Allergy   . Coronary artery  disease   . Diabetes mellitus without complication ([place])   . Diastolic CHF ([place])   . GERD (gastroesophageal reflux disease)   . Hyperlipidemia   . Hypertension   . Renal insufficiency   . Sleep apnea     Family History: Family History  Problem Relation Age of Onset  . Diabetes Mother   . Lung cancer Father   . Diabetes Sister   . Hypertension Sister   . Heart disease Sister   . Cancer Sister   . Bone cancer Brother     Social History   Socioeconomic History  . Marital status: Single    Spouse name: Not on file  . Number of children: Not on file  . Years of education: Not on file  . Highest education level: Not on file  Occupational History  . Not on file  Tobacco Use  . Smoking status: Former Smoker    Types: Cigarettes  . Smokeless tobacco: Former Systems developer    Types: Secondary school teacher  . Vaping Use: Never used  Substance and Sexual Activity  .  Alcohol use: No  . Drug use: No  . Sexual activity: Not on file  Other Topics Concern  . Not on file  Social History Narrative  . Not on file   Social Determinants of Health   Financial Resource Strain:   . Difficulty of Paying Living Expenses:   Food Insecurity:   . Worried About Charity fundraiser in the Last Year:   . Arboriculturist in the Last Year:   Transportation Needs:   . Film/video editor (Medical):   Marland Kitchen Lack of Transportation (Non-Medical):   Physical Activity:   . Days of Exercise per Week:   . Minutes of Exercise per Session:   Stress:   . Feeling of Stress :   Social Connections:   . Frequency of Communication with Friends and Family:   . Frequency of Social Gatherings with Friends and Family:   . Attends Religious Services:   . Active Member of Clubs or Organizations:   . Attends Archivist Meetings:   Marland Kitchen Marital Status:   Intimate Partner Violence:   . Fear of Current or Ex-Partner:   . Emotionally Abused:   Marland Kitchen Physically Abused:   . Sexually Abused:       Review of Systems  Constitutional: Negative for activity change, chills, fatigue and unexpected weight change.  HENT: Negative for congestion, postnasal drip, rhinorrhea, sneezing and sore throat.   Respiratory: Positive for shortness of breath. Negative for cough and chest tightness.        With exertion.  Was hospitalized from 11/19/2019 through 11/21/2019 due to hemoptysis.   Cardiovascular: Positive for leg swelling. Negative for chest pain and palpitations.  Gastrointestinal: Negative for abdominal pain, constipation, diarrhea, nausea and vomiting.  Musculoskeletal: Negative for arthralgias, back pain, joint swelling and neck pain.  Skin: Negative for rash.  Allergic/Immunologic: Negative for environmental allergies.  Neurological: Negative for dizziness, tremors, numbness and headaches.  Hematological: Negative for adenopathy. Does not bruise/bleed easily.  Psychiatric/Behavioral:  Negative for behavioral problems (Depression), sleep disturbance and suicidal ideas. The patient is not nervous/anxious.     Today's Vitals   11/26/19 1352  Height: _0  (1.803 m)   Body mass index is 34.75 kg/m.  Observation/Objective:  The patient is alert and oriented. He is pleasant and answering all questions appropriately. Breathing is non-labored. He is in no acute distress.    Assessment/Plan:  1. Hospital discharge follow-up Patient hospitalized from 11/19/2019 through 11/21/2019 due to hemoptysis. Reviewed progress notes, labs, and imaging studies done while hospitalized.   2. Hemoptysis Unable to find cause for hemoptysis while hospitialized.  Patient to be referred to GI for follow up.  3. Iron deficiency anemia, unspecified iron deficiency anemia type Refer to GI for further evaluation and treatment.  - Ambulatory referral to Gastroenterology  4. Hypokalemia KCL level 3.4 in hospital. Continue potassium supplement as prescribed  - potassium chloride SA (KLOR-CON) 20 MEQ tablet; Take 1 tablet (20 mEq total) by mouth 2 (two) times daily.  Dispense: 60 tablet; Refill: 1  5. Screening for colon cancer Refer to GI for further evaluation and treatment.  - Ambulatory referral to Gastroenterology  6. Diabetic polyneuropathy associated with type 2 diabetes mellitus (HCC) Take gabapentin as originally prescribed. Continue diabetic medications as prescribed  - gabapentin (NEURONTIN) 100 MG capsule; Take 1 capsule (100 mg total) by mouth in the morning, at noon, and at bedtime.  Dispense: 90 capsule; Refill: 1  General Counseling: Denario verbalizes understanding of the findings of today's phone visit and agrees with plan of treatment. I have discussed any further diagnostic evaluation that may be needed or ordered today. We also reviewed his medications today. he has been encouraged to call the office with any questions or concerns that should arise related to todays  visit.  This patient was seen by Leretha Pol FNP Collaboration with Dr Lavera Guise as a part of collaborative care agreement  Orders Placed This Encounter  Procedures  . Ambulatory referral to Gastroenterology    Meds ordered this encounter  Medications  . gabapentin (NEURONTIN) 100 MG capsule    Sig: Take 1 capsule (100 mg total) by mouth in the morning, at noon, and at bedtime.    Dispense:  90 capsule    Refill:  1    Order Specific Question:   Supervising Provider    Answer:   Lavera Guise [[id number]]  . potassium chloride SA (KLOR-CON) 20 MEQ tablet    Sig: Take 1 tablet (20 mEq total) by mouth 2 (two) times daily.    Dispense:  60 tablet    Refill:  1    Order Specific Question:   Supervising Provider    Answer:   Lavera Guise [[id number]]    Time spent: 77 Minutes    Dr Lavera Guise Internal medicine

## 2019-11-26 NOTE — Telephone Encounter (Signed)
Any reason why he canceled his appointment tomorrow.  He needs a colonoscopy  Thanks RV

## 2019-11-27 ENCOUNTER — Telehealth (HOSPITAL_COMMUNITY): Payer: Self-pay

## 2019-11-27 ENCOUNTER — Ambulatory Visit: Payer: Medicare PPO | Admitting: Gastroenterology

## 2019-11-27 NOTE — Telephone Encounter (Signed)
Attempted to contact, no answer left message.  Will continue to try to contact.   Covey Baller [place] EMT-Paramedic [phone] 

## 2019-11-27 NOTE — Telephone Encounter (Signed)
Larry Callahan returned my phone call back.  He states feeling better.  Advised him of missed phone calls from Dr Marius Ditch office and he states he is waiting for his PCP to contact him about that appt.  He told me to contact them and make him appt.  Contacted Dr Marius Ditch and set up a phone appt for him on 12/17/2019 at 10:00.  Advised him of appt and to answer his phone.  He states he has all his medications.  He is aware of how to take them.  He denies any problems today.  Will continue to visit for heart Failure.   [place] EMT-paramedic [phone]

## 2019-12-03 ENCOUNTER — Telehealth: Payer: Self-pay

## 2019-12-03 ENCOUNTER — Emergency Department
Admission: EM | Admit: 2019-12-03 | Discharge: 2019-12-03 | Disposition: A | Discharge status: Home / Self Care | Payer: Medicare PPO | Attending: Emergency Medicine | Admitting: Emergency Medicine

## 2019-12-03 ENCOUNTER — Telehealth: Payer: Self-pay | Admitting: Cardiovascular Disease

## 2019-12-03 ENCOUNTER — Emergency Department: Payer: Medicare PPO

## 2019-12-03 ENCOUNTER — Ambulatory Visit: Payer: Medicare PPO | Admitting: Family

## 2019-12-03 ENCOUNTER — Other Ambulatory Visit: Payer: Self-pay

## 2019-12-03 ENCOUNTER — Encounter: Payer: Self-pay | Admitting: Emergency Medicine

## 2019-12-03 DIAGNOSIS — I5032 Chronic diastolic (congestive) heart failure: Secondary | ICD-10-CM | POA: Insufficient documentation

## 2019-12-03 DIAGNOSIS — R042 Hemoptysis: Secondary | ICD-10-CM | POA: Diagnosis not present

## 2019-12-03 DIAGNOSIS — G4489 Other headache syndrome: Secondary | ICD-10-CM | POA: Diagnosis not present

## 2019-12-03 DIAGNOSIS — N1832 Chronic kidney disease, stage 3b: Secondary | ICD-10-CM | POA: Diagnosis not present

## 2019-12-03 DIAGNOSIS — R58 Hemorrhage, not elsewhere classified: Secondary | ICD-10-CM | POA: Diagnosis not present

## 2019-12-03 DIAGNOSIS — R0789 Other chest pain: Secondary | ICD-10-CM | POA: Diagnosis not present

## 2019-12-03 DIAGNOSIS — I2510 Atherosclerotic heart disease of native coronary artery without angina pectoris: Secondary | ICD-10-CM | POA: Diagnosis not present

## 2019-12-03 DIAGNOSIS — E1122 Type 2 diabetes mellitus with diabetic chronic kidney disease: Secondary | ICD-10-CM | POA: Insufficient documentation

## 2019-12-03 DIAGNOSIS — Z87891 Personal history of nicotine dependence: Secondary | ICD-10-CM | POA: Insufficient documentation

## 2019-12-03 DIAGNOSIS — R079 Chest pain, unspecified: Secondary | ICD-10-CM | POA: Diagnosis not present

## 2019-12-03 DIAGNOSIS — I491 Atrial premature depolarization: Secondary | ICD-10-CM | POA: Diagnosis not present

## 2019-12-03 DIAGNOSIS — Z794 Long term (current) use of insulin: Secondary | ICD-10-CM | POA: Insufficient documentation

## 2019-12-03 DIAGNOSIS — I110 Hypertensive heart disease with heart failure: Secondary | ICD-10-CM | POA: Insufficient documentation

## 2019-12-03 LAB — TROPONIN I (HIGH SENSITIVITY)
Troponin I (High Sensitivity): 10 ng/L (ref <18)
Troponin I (High Sensitivity): 10 ng/L (ref <18)

## 2019-12-03 LAB — BASIC METABOLIC PANEL
Anion gap: 11 (ref 5–15)
BUN: 62 mg/dL — ABNORMAL HIGH (ref 8–23)
CO2: 20 mmol/L — ABNORMAL LOW (ref 22–32)
Calcium: 9.1 mg/dL (ref 8.9–10.3)
Chloride: 107 mmol/L (ref 98–111)
Creatinine, Ser: 2.62 mg/dL — ABNORMAL HIGH (ref 0.61–1.24)
GFR calc Af Amer: 28 mL/min — ABNORMAL LOW (ref >60)
GFR calc non Af Amer: 24 mL/min — ABNORMAL LOW (ref >60)
Glucose, Bld: 207 mg/dL — ABNORMAL HIGH (ref 70–99)
Potassium: 4 mmol/L (ref 3.5–5.1)
Sodium: 138 mmol/L (ref 135–145)

## 2019-12-03 LAB — CBC
HCT: 35.4 % — ABNORMAL LOW (ref 39.0–52.0)
Hemoglobin: 12 g/dL — ABNORMAL LOW (ref 13.0–17.0)
MCH: 29.2 pg (ref 26.0–34.0)
MCHC: 33.9 g/dL (ref 30.0–36.0)
MCV: 86.1 fL (ref 80.0–100.0)
Platelets: 216 10*3/uL (ref 150–400)
RBC: 4.11 MIL/uL — ABNORMAL LOW (ref 4.22–5.81)
RDW: 14 % (ref 11.5–15.5)
WBC: 9.7 10*3/uL (ref 4.0–10.5)
nRBC: 0 % (ref 0.0–0.2)

## 2019-12-03 MED ORDER — ISOSORBIDE MONONITRATE ER 30 MG PO TB24: 30.0000 mg | ORAL_TABLET | Freq: Every day | ORAL | 1 refills | Status: DC

## 2019-12-03 MED ORDER — PREDNISONE 20 MG PO TABS: 40.0000 mg | ORAL_TABLET | Freq: Every day | ORAL | 0 refills | Status: DC

## 2019-12-03 NOTE — ED Triage Notes (Signed)
Patient brought by EMS for complaints of chest pain and spitting up blood. Patient reports taking 1 nitro with no relief.   Given 324 ASA by EMS 18G L AC

## 2019-12-03 NOTE — Telephone Encounter (Signed)
isosorbide mononitrate (IMDUR) 30 MG 24 hr tablet 30 tablet 1 12/03/2019    Sig - Route: Take 1 tablet (30 mg total) by mouth daily. - Oral   Sent to pharmacy as: isosorbide mononitrate (IMDUR) 30 MG 24 hr tablet   E-Prescribing Status: Sent to pharmacy (12/03/2019 12:30 PM EDT)   Pharmacy  [place], [place] [facility] [address]

## 2019-12-03 NOTE — Telephone Encounter (Signed)
Patient called stated is short of breathe, having chest pains, and spitting up blood advised patient to call an ambulance as soon as possible. klh

## 2019-12-03 NOTE — Telephone Encounter (Signed)
Pt has been advised to contact his cardiologist for his heart medications. KM.

## 2019-12-03 NOTE — ED Provider Notes (Signed)
[facility] Emergency Department Provider Note  Time seen: 4:50 PM  I have reviewed the triage vital signs and the nursing notes.   HISTORY  Chief Complaint Chest Pain and Hemoptysis   HPI Larry Callahan is a 64 y.o. male with a past medical history of CAD, diabetes, gastric reflux, hypertension, hyperlipidemia presents to the emergency department for chest pain and hemoptysis.  According to the patient since around 11 AM he has been experiencing some chest discomfort states he has been coughing over the past few days and today around 1 PM noticed some blood in his sputum.  Patient was recently seen in the hospital for the same had a negative work-up including a CT scan of the chest and EGD showing no significant abnormality.  Patient was ultimately discharged home.  Here the patient appears well, states that chest pain was severe earlier today and now it is moderate in the center of his chest.  Denies any shortness of breath does state occasional cough.  Patient tried a nitroglycerin tablet at home without relief so he came to the emergency department.  Patient states he only saw blood in his sputum 1 time today around 1 PM.   Past Medical History:  Diagnosis Date  . Allergy   . Coronary artery disease   . Diabetes mellitus without complication ([place])   . Diastolic CHF ([place])   . GERD (gastroesophageal reflux disease)   . Hyperlipidemia   . Hypertension   . Renal insufficiency   . Sleep apnea     Patient Active Problem List   Diagnosis Date Noted  . Hospital discharge follow-up 11/26/2019  . Cellulitis of left hand 09/26/2019  . Left-sided weakness 08/08/2019  . BMI 35.0-35.9,adult 08/08/2019  . Acute right hip pain 06/17/2019  . Inability to ambulate due to hip 06/17/2019  . CKD stage 3 due to type 2 diabetes mellitus ([place])   . Hypervolemia   . Acute on chronic diastolic CHF (congestive heart failure) ([place])   . Lobar pneumonia ([place])   . Type 2  diabetes mellitus with stage 3b chronic kidney disease, with long-term current use of insulin ([place])   . Hypokalemia   . Hypomagnesemia   . Full code status 05/20/2019  . Pleuritic chest pain   . CAP (community acquired pneumonia) 05/19/2019  . AKI (acute kidney injury) ([place]) 05/09/2019  . Hemoptysis 04/09/2019  . Shortness of breath 03/26/2019  . Uncontrolled type 2 diabetes mellitus with insulin therapy ([place]) 03/12/2019  . Acute on chronic heart failure with preserved ejection fraction (HFpEF) ([place])   . Acute kidney injury superimposed on chronic kidney disease ([place])   . CHF (congestive heart failure) ([place]) 03/01/2019  . Diastolic dysfunction 34/74/2595  . Iron deficiency anemia 12/31/2018  . Atopic dermatitis 11/24/2018  . Screening for colon cancer 11/06/2017  . Uncontrolled type 2 diabetes mellitus with hyperglycemia ([place]) 11/06/2017  . Dermatitis, unspecified 06/09/2017  . Atherosclerotic heart disease of native coronary artery without angina pectoris 06/09/2017  . 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 Obstructive sleep apnea, adult 06/09/2017  . Morbid (severe) obesity due to excess calories ([place]) 06/09/2017  . Cigarette nicotine dependence with nicotine-induced disorder 06/09/2017  . Diabetic polyneuropathy associated with type 2 diabetes mellitus ([place]) 06/09/2017  . Allergic rhinitis due to pollen 06/09/2017  . Mixed hyperlipidemia 06/09/2017  . COPD with acute exacerbation ([place]) 06/09/2017  . Dyspnea on exertion 06/09/2017  . Wheezing 06/09/2017  . Dysuria 06/09/2017  .  Snoring 06/09/2017  . Essential hypertension 06/09/2017  . Personal history of other malignant neoplasm of kidney 06/09/2017  . Pain in right hip 06/09/2017  . Impacted cerumen, bilateral 06/09/2017  . Secondary hyperparathyroidism, not elsewhere classified ([place]) 06/09/2017  . Type 2 diabetes mellitus with hyperglycemia ([place]) 06/09/2017  . Tinea corporis 06/09/2017     Past Surgical History:  Procedure Laterality Date  . BACK SURGERY    . CARDIAC CATHETERIZATION    . CHOLECYSTECTOMY    . COLONOSCOPY WITH PROPOFOL N/A 04/12/2019   Procedure: COLONOSCOPY WITH PROPOFOL;  Surgeon: [name], MD;  Location: [facility] ENDOSCOPY;  Service: Gastroenterology;  Laterality: N/A;  . ESOPHAGOGASTRODUODENOSCOPY N/A 04/12/2019   Procedure: ESOPHAGOGASTRODUODENOSCOPY (EGD);  Surgeon: [name], MD;  Location: [facility] ENDOSCOPY;  Service: Gastroenterology;  Laterality: N/A;  . ESOPHAGOGASTRODUODENOSCOPY (EGD) WITH PROPOFOL N/A 11/21/2019   Procedure: ESOPHAGOGASTRODUODENOSCOPY (EGD) WITH PROPOFOL;  Surgeon: [name], MD;  Location: [facility] ENDOSCOPY;  Service: Gastroenterology;  Laterality: N/A;  . FLEXIBLE BRONCHOSCOPY Bilateral 05/17/2019   Procedure: FLEXIBLE BRONCHOSCOPY;  Surgeon: [name], MD;  Location: [name] ORS;  Service: Pulmonary;  Laterality: Bilateral;  . left arm surgery    . nephrectomy Left   . PARATHYROIDECTOMY    . RIGHT HEART CATH N/A 03/29/2019   Procedure: RIGHT HEART CATH;  Surgeon: [name], MD;  Location: [facility] CV LAB;  Service: Cardiovascular;  Laterality: N/A;    Prior to Admission medications   Medication Sig Start Date End Date Taking? Authorizing Provider  acetaminophen (TYLENOL) 325 MG tablet Take 2 tablets (650 mg total) by mouth every 4 (four) hours as needed for headache or mild pain. 03/05/19   Gouru, Illene Silver, MD  albuterol (VENTOLIN HFA) 108 (90 Base) MCG/ACT inhaler Inhale 2 puffs into the lungs every 6 (six) hours as needed for wheezing or shortness of breath. 03/05/19   Gouru, Illene Silver, MD  amLODipine (NORVASC) 10 MG tablet Take 1 tablet (10 mg total) by mouth daily. 08/01/19   [name], NP  Blood Glucose Calibration (TRUE METRIX LEVEL 1) Low SOLN Use as directed 04/04/18   [name], NP  Blood Glucose Monitoring Suppl (TRUE METRIX AIR GLUCOSE METER) DEVI 1 Device by Does not  apply route 2 (two) times daily. 04/13/18   [name], NP  ferrous gluconate (FERGON) 324 MG tablet Take 1 tablet (324 mg total) by mouth daily with breakfast. 08/01/19   [name], NP  furosemide (LASIX) 80 MG tablet Hold until outpatient PCP followup due to acute kidney injury. 11/21/19   Enzo Bi, MD  gabapentin (NEURONTIN) 100 MG capsule Take 1 capsule (100 mg total) by mouth in the morning, at noon, and at bedtime. 11/26/19   [name], NP  gemfibrozil (LOPID) 600 MG tablet Take 1 tablet (600 mg total) by mouth 2 (two) times daily with a meal. 06/27/19   Boscia, Greer Ee, NP  glimepiride (AMARYL) 2 MG tablet Take 1 tablet (2 mg total) by mouth at bedtime. 08/02/19   Lavera Guise, MD  glucose blood (TRUE METRIX BLOOD GLUCOSE TEST) test strip Use as instructed twice daily diag E11.65 07/30/19   [name], NP  guaiFENesin (MUCINEX) 600 MG 12 hr tablet Take 1 tablet (600 mg total) by mouth 2 (two) times daily as needed. 11/21/19   Enzo Bi, MD  hydrALAZINE (APRESOLINE) 100 MG tablet TAKE 1 TABLET THREE TIMES DAILY 10/16/19   [name], NP  hydrOXYzine (ATARAX) 10  MG/5ML syrup Take 5 mLs (10 mg total) by mouth 3 (three) times daily as needed for itching. 09/24/19   Sable Feil, PA-C  insulin glargine, 1 Unit Dial, (TOUJEO) 300 UNIT/ML Solostar Pen Inject 26 Units into the skin daily. 10/24/19   [name], NP  ipratropium-albuterol (DUONEB) 0.5-2.5 (3) MG/3ML SOLN Take 3 mLs by nebulization every 6 (six) hours as needed. 03/08/19   Kendell Bane, NP  isosorbide mononitrate (IMDUR) 30 MG 24 hr tablet Take 1 tablet (30 mg total) by mouth daily. 12/03/19   [name], MD  loratadine (CLARITIN) 10 MG tablet Take 1 tablet (10 mg total) by mouth daily as needed for allergies. 11/21/19   Enzo Bi, MD  metoprolol succinate (TOPROL-XL) 50 MG 24 hr tablet Take 1 tablet (50 mg total) by mouth at bedtime. Take with or immediately following a meal. 07/10/19   Lavera Guise, MD  naproxen (NAPROSYN) 500 MG tablet Take 1 tablet (500 mg total) by mouth 2 (two) times daily as needed. 11/21/19   Enzo Bi, MD  nitroGLYCERIN (NITROSTAT) 0.4 MG SL tablet Place 1 tablet (0.4 mg total) under the tongue every 5 (five) minutes x 3 doses as needed for chest pain. 05/24/19   Loletha Grayer, MD  NOVOFINE 32G X 6 MM MISC To use with [place] injections daily 12/13/18   [name], NP  omeprazole (PRILOSEC) 40 MG capsule 1 cap daily Patient taking differently: Take 40 mg by mouth daily.  07/10/19   Lavera Guise, MD  potassium chloride SA (KLOR-CON) 20 MEQ tablet Take 1 tablet (20 mEq total) by mouth 2 (two) times daily. 11/26/19   [name], NP  rosuvastatin (CRESTOR) 10 MG tablet Take 1 tablet (10 mg total) by mouth daily. 08/23/19 11/21/19  [name], MD  sodium chloride (OCEAN) 0.65 % SOLN nasal spray Place 1 spray into both nostrils as needed for congestion. 06/18/19   Lavina Hamman, MD  tiotropium (SPIRIVA HANDIHALER) 18 MCG inhalation capsule Place 1 capsule (18 mcg total) into inhaler and inhale daily. 08/07/19 11/19/19  [name], NP  TRUEplus Lancets 33G MISC Use as directed twice daily diag E11.65 07/30/19   [name], NP  vitamin B-12 (CYANOCOBALAMIN) 1000 MCG tablet Take 1 tablet (1,000 mcg total) by mouth daily. 11/21/19   Enzo Bi, MD    Allergies  Allergen Reactions  . Penicillins Rash    Family History  Problem Relation Age of Onset  . Diabetes Mother   . Lung cancer Father   . Diabetes Sister   . Hypertension Sister   . Heart disease Sister   . Cancer Sister   . Bone cancer Brother     Social History Social History   Tobacco Use  . Smoking status: Former Smoker    Types: Cigarettes  . Smokeless tobacco: Former Systems developer    Types: Secondary school teacher  . Vaping Use: Never used  Substance Use Topics  . Alcohol use: No  . Drug use: No    Review of Systems Constitutional: Negative for fever. Cardiovascular: Positive  for chest pain since 11 AM. Respiratory: Negative for shortness of breath. Gastrointestinal: Negative for abdominal pain, vomiting and diarrhea. Musculoskeletal: Negative for musculoskeletal complaints Neurological: Negative for headache All other ROS negative  ____________________________________________   PHYSICAL EXAM:  VITAL SIGNS: ED Triage Vitals [12/03/19 1515]  Enc Vitals Group     BP (!) 158/72     Pulse Rate  77     Resp 17     Temp 98.2 F (36.8 C)     Temp src      SpO2 97 %     Weight      Height      Head Circumference      Peak Flow      Pain Score      Pain Loc      Pain Edu?      Excl. in [place]?     Constitutional: Alert and oriented. Well appearing and in no distress. Eyes: Normal exam ENT      Head: Normocephalic and atraumatic.      Mouth/Throat: Mucous membranes are moist. Cardiovascular: Normal rate, regular rhythm.  Respiratory: Normal respiratory effort without tachypnea nor retractions. Breath sounds are clear  Gastrointestinal: Soft and nontender. No distention.   Musculoskeletal: Nontender with normal range of motion in all extremities.  Neurologic:  Normal speech and language. No gross focal neurologic deficits  Skin:  Skin is warm, dry and intact.  Psychiatric: Mood and affect are normal.   ____________________________________________    EKG  EKG viewed and interpreted by myself shows sinus rhythm at 72 bpm with a narrow QRS, normal axis, largely normal intervals with nonspecific ST changes.  ____________________________________________    RADIOLOGY  Chest x-ray shows no acute abnormality  ____________________________________________   INITIAL IMPRESSION / ASSESSMENT AND PLAN / ED COURSE  Pertinent labs & imaging results that were available during my care of the patient were reviewed by me and considered in my medical decision making (see chart for details).   Patient presents to the emergency department for chest pain and  hemoptysis.  Patient was recently admitted to the hospital for the same.  I reviewed the patient's work-up at that time which was negative including an EGD CT of the chest.  Patient has chronic kidney disease which has worsened somewhat today's creatinine is 2.6.  Troponin is negative.  Chest x-ray is clear.  Overall patient appears well.  Clear lung sounds.  Patient denies any bloody sputum since 1 PM.  Not entirely clear the cause of the patient's recurring hemoptysis and chest pain.  We will dose pain medication, repeat a troponin and continue to closely monitor.  Overall the patient appears well, reassuring work-up thus far.  No hemoptysis in the emergency department.  Patient's repeat troponin remains negative.  Patient appears well.  No further episodes of hemoptysis in the emergency department.  We will discharge the patient home on a course of prednisone and have the patient follow-up with his doctor.  I did discuss return precautions for any further hemoptysis development of chest pain or shortness of breath.  Patient agreeable to plan of care.   Gerod Caligiuri Maue was evaluated in Emergency Department on 12/03/2019 for the symptoms described in the history of present illness. He was evaluated in the context of the global COVID-19 pandemic, which necessitated consideration that the patient might be at risk for infection with the SARS-CoV-2 virus that causes COVID-19. Institutional protocols and algorithms that pertain to the evaluation of patients at risk for COVID-19 are in a state of rapid change based on information released by regulatory bodies including the CDC and federal and state organizations. These policies and algorithms were followed during the patient's care in the ED.  ____________________________________________   FINAL CLINICAL IMPRESSION(S) / ED DIAGNOSES  Chest pain Hemoptysis   Harvest Dark, MD 12/03/19 [id number]

## 2019-12-03 NOTE — Telephone Encounter (Signed)
Patients sister called in stating she needed a medication called in but did not know the name and told me to call the pharmacy. [facility] was then called and they as well did not know what medication the patients sister was referring too. Patients sister was called back and told to speak to her brother to find out the name of medication

## 2019-12-03 NOTE — Telephone Encounter (Signed)
*  STAT* If patient is at the pharmacy, call can be transferred to refill team.   1. Which medications need to be refilled? (please list name of each medication and dose if known) isosorbide mononitrate  30 mg  2. Which pharmacy/location (including street and city if local pharmacy) is medication to be sent to? [facility]  3. Do they need a 30 day or 90 day supply? [place]

## 2019-12-06 ENCOUNTER — Telehealth: Payer: Self-pay

## 2019-12-06 NOTE — Telephone Encounter (Signed)
Called lmom informing patient of appointment on 12/10/2019. klh

## 2019-12-09 ENCOUNTER — Ambulatory Visit: Payer: Medicare PPO | Admitting: Internal Medicine

## 2019-12-10 ENCOUNTER — Emergency Department
Admission: EM | Admit: 2019-12-10 | Discharge: 2019-12-10 | Disposition: A | Discharge status: Home / Self Care | Payer: Medicare PPO | Attending: Emergency Medicine | Admitting: Emergency Medicine

## 2019-12-10 ENCOUNTER — Telehealth: Payer: Self-pay

## 2019-12-10 ENCOUNTER — Emergency Department: Payer: Medicare PPO

## 2019-12-10 ENCOUNTER — Encounter: Payer: Self-pay | Admitting: Emergency Medicine

## 2019-12-10 ENCOUNTER — Ambulatory Visit: Payer: Medicare PPO | Admitting: Internal Medicine

## 2019-12-10 DIAGNOSIS — K429 Umbilical hernia without obstruction or gangrene: Secondary | ICD-10-CM | POA: Diagnosis not present

## 2019-12-10 DIAGNOSIS — E279 Disorder of adrenal gland, unspecified: Secondary | ICD-10-CM | POA: Diagnosis not present

## 2019-12-10 DIAGNOSIS — Z87891 Personal history of nicotine dependence: Secondary | ICD-10-CM | POA: Insufficient documentation

## 2019-12-10 DIAGNOSIS — R58 Hemorrhage, not elsewhere classified: Secondary | ICD-10-CM | POA: Diagnosis not present

## 2019-12-10 DIAGNOSIS — I129 Hypertensive chronic kidney disease with stage 1 through stage 4 chronic kidney disease, or unspecified chronic kidney disease: Secondary | ICD-10-CM | POA: Diagnosis not present

## 2019-12-10 DIAGNOSIS — Z9861 Coronary angioplasty status: Secondary | ICD-10-CM | POA: Diagnosis not present

## 2019-12-10 DIAGNOSIS — E1122 Type 2 diabetes mellitus with diabetic chronic kidney disease: Secondary | ICD-10-CM | POA: Insufficient documentation

## 2019-12-10 DIAGNOSIS — I5043 Acute on chronic combined systolic (congestive) and diastolic (congestive) heart failure: Secondary | ICD-10-CM | POA: Diagnosis not present

## 2019-12-10 DIAGNOSIS — I130 Hypertensive heart and chronic kidney disease with heart failure and stage 1 through stage 4 chronic kidney disease, or unspecified chronic kidney disease: Secondary | ICD-10-CM | POA: Insufficient documentation

## 2019-12-10 DIAGNOSIS — N1832 Chronic kidney disease, stage 3b: Secondary | ICD-10-CM | POA: Diagnosis not present

## 2019-12-10 DIAGNOSIS — N1831 Chronic kidney disease, stage 3a: Secondary | ICD-10-CM | POA: Diagnosis not present

## 2019-12-10 DIAGNOSIS — N281 Cyst of kidney, acquired: Secondary | ICD-10-CM | POA: Diagnosis not present

## 2019-12-10 DIAGNOSIS — R319 Hematuria, unspecified: Secondary | ICD-10-CM | POA: Insufficient documentation

## 2019-12-10 DIAGNOSIS — Z79899 Other long term (current) drug therapy: Secondary | ICD-10-CM | POA: Insufficient documentation

## 2019-12-10 DIAGNOSIS — Z794 Long term (current) use of insulin: Secondary | ICD-10-CM | POA: Diagnosis not present

## 2019-12-10 DIAGNOSIS — R310 Gross hematuria: Secondary | ICD-10-CM

## 2019-12-10 DIAGNOSIS — E278 Other specified disorders of adrenal gland: Secondary | ICD-10-CM | POA: Diagnosis not present

## 2019-12-10 DIAGNOSIS — E119 Type 2 diabetes mellitus without complications: Secondary | ICD-10-CM | POA: Diagnosis not present

## 2019-12-10 DIAGNOSIS — I700 Atherosclerosis of aorta: Secondary | ICD-10-CM | POA: Diagnosis not present

## 2019-12-10 LAB — URINALYSIS, COMPLETE (UACMP) WITH MICROSCOPIC
Bilirubin Urine: NEGATIVE
Glucose, UA: NEGATIVE mg/dL
Ketones, ur: NEGATIVE mg/dL
Nitrite: NEGATIVE
Protein, ur: 100 mg/dL — AB
RBC / HPF: >50 RBC/hpf — ABNORMAL HIGH (ref 0–5)
Specific Gravity, Urine: 1.009 (ref 1.005–1.030)
pH: 5 (ref 5.0–8.0)

## 2019-12-10 LAB — CBC
HCT: 34 % — ABNORMAL LOW (ref 39.0–52.0)
Hemoglobin: 11.3 g/dL — ABNORMAL LOW (ref 13.0–17.0)
MCH: 29 pg (ref 26.0–34.0)
MCHC: 33.2 g/dL (ref 30.0–36.0)
MCV: 87.2 fL (ref 80.0–100.0)
Platelets: 238 10*3/uL (ref 150–400)
RBC: 3.9 MIL/uL — ABNORMAL LOW (ref 4.22–5.81)
RDW: 13.8 % (ref 11.5–15.5)
WBC: 7.4 10*3/uL (ref 4.0–10.5)
nRBC: 0 % (ref 0.0–0.2)

## 2019-12-10 LAB — COMPREHENSIVE METABOLIC PANEL
ALT: 17 U/L (ref 0–44)
AST: 17 U/L (ref 15–41)
Albumin: 3.6 g/dL (ref 3.5–5.0)
Alkaline Phosphatase: 83 U/L (ref 38–126)
Anion gap: 11 (ref 5–15)
BUN: 53 mg/dL — ABNORMAL HIGH (ref 8–23)
CO2: 21 mmol/L — ABNORMAL LOW (ref 22–32)
Calcium: 9.2 mg/dL (ref 8.9–10.3)
Chloride: 107 mmol/L (ref 98–111)
Creatinine, Ser: 2.01 mg/dL — ABNORMAL HIGH (ref 0.61–1.24)
GFR calc Af Amer: 39 mL/min — ABNORMAL LOW (ref >60)
GFR calc non Af Amer: 34 mL/min — ABNORMAL LOW (ref >60)
Glucose, Bld: 130 mg/dL — ABNORMAL HIGH (ref 70–99)
Potassium: 4.1 mmol/L (ref 3.5–5.1)
Sodium: 139 mmol/L (ref 135–145)
Total Bilirubin: 0.5 mg/dL (ref 0.3–1.2)
Total Protein: 7.1 g/dL (ref 6.5–8.1)

## 2019-12-10 LAB — LIPASE, BLOOD: Lipase: 40 U/L (ref 11–51)

## 2019-12-10 MED ORDER — SODIUM CHLORIDE 0.9% FLUSH: 3.0000 mL | Freq: Once | INTRAVENOUS | Status: DC

## 2019-12-10 MED ORDER — MUCINEX 600 MG PO TB12
600.0000 mg | ORAL_TABLET | Freq: Two times a day (BID) | ORAL | 0 refills | Status: AC | PRN
Start: 2019-12-10 — End: 2019-12-17

## 2019-12-10 MED ORDER — BENZONATATE 100 MG PO CAPS
100.0000 mg | ORAL_CAPSULE | Freq: Three times a day (TID) | ORAL | 0 refills | Status: DC | PRN
Start: 2019-12-10 — End: 2020-07-17

## 2019-12-10 NOTE — ED Notes (Signed)
Resumed care from [facility] rn  Pt to ct scan.

## 2019-12-10 NOTE — ED Triage Notes (Signed)
Pt comes into the ED via EMS from home with c/o having blood in urine with painful urination today and called PCP referred to the ED for treatment. VSS

## 2019-12-10 NOTE — Discharge Instructions (Signed)
Your CT scan shows cyst on your kidney which are causing the bleeding in your urine.  You also have a mass on your adrenal gland.  You should follow-up with urology for further evaluation of these findings, especially since he only have 1 kidney.  Your urine test does not show any infection today, your blood counts are unchanged from before, and your kidney function is at your usual level.  Continue following up with your doctor for monitoring of your symptoms.  You should take a cough medicine for the next 3 days at least to allow your throat to heal and decrease the coughing up blood.

## 2019-12-10 NOTE — ED Triage Notes (Signed)
Says he is here because he sees blood in urine.  He called his doctor and they told him to come here as they are not seeing patients in person right now.  He then said he is spitting up blood too.  That started again today--recently admitted for same.

## 2019-12-10 NOTE — ED Notes (Signed)
Pt alert.  Waiting on er disposition.

## 2019-12-10 NOTE — ED Provider Notes (Signed)
[facility] Emergency Department Provider Note  ____________________________________________  Time seen: Approximately 3:17 PM  I have reviewed the triage vital signs and the nursing notes.   HISTORY  Chief Complaint Hematuria    HPI Larry Callahan is a 64 y.o. male with a history of diabetes, CHF, GERD, COPD who comes ED complaining of gross hematuria that started this morning.  His doctor is unable to see him today so he came to the ED for evaluation.  Reports that about a week ago he was given 5 days of antibiotics for UTI which she has completed.  Denies fevers or chills.  No acute chest pain or shortness of breath, and his COPD symptoms are at baseline.  No orthopnea or peripheral edema, compliant with Lasix therapy.  Denies penile discharge.  Endorses dysuria.  No aggravating or alleviating factors.  Denies abdominal pain or radiating pain.      Past Medical History:  Diagnosis Date  . Allergy   . Coronary artery disease   . Diabetes mellitus without complication ([place])   . Diastolic CHF ([place])   . GERD (gastroesophageal reflux disease)   . Hyperlipidemia   . Hypertension   . Renal insufficiency   . Sleep apnea      Patient Active Problem List   Diagnosis Date Noted  . Hospital discharge follow-up 11/26/2019  . Cellulitis of left hand 09/26/2019  . Left-sided weakness 08/08/2019  . BMI 35.0-35.9,adult 08/08/2019  . Acute right hip pain 06/17/2019  . Inability to ambulate due to hip 06/17/2019  . CKD stage 3 due to type 2 diabetes mellitus ([place])   . Hypervolemia   . Acute on chronic diastolic CHF (congestive heart failure) ([place])   . Lobar pneumonia ([place])   . Type 2 diabetes mellitus with stage 3b chronic kidney disease, with long-term current use of insulin ([place])   . Hypokalemia   . Hypomagnesemia   . Full code status 05/20/2019  . Pleuritic chest pain   . CAP (community acquired pneumonia) 05/19/2019  . AKI (acute kidney  injury) ([place]) 05/09/2019  . Hemoptysis 04/09/2019  . Shortness of breath 03/26/2019  . Uncontrolled type 2 diabetes mellitus with insulin therapy ([place]) 03/12/2019  . Acute on chronic heart failure with preserved ejection fraction (HFpEF) ([place])   . Acute kidney injury superimposed on chronic kidney disease ([place])   . CHF (congestive heart failure) ([place]) 03/01/2019  . Diastolic dysfunction 36/46/8032  . Iron deficiency anemia 12/31/2018  . Atopic dermatitis 11/24/2018  . Screening for colon cancer 11/06/2017  . Uncontrolled type 2 diabetes mellitus with hyperglycemia ([place]) 11/06/2017  . Dermatitis, unspecified 06/09/2017  . Atherosclerotic heart disease of native coronary artery without angina pectoris 06/09/2017  . 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 Obstructive sleep apnea, adult 06/09/2017  . Morbid (severe) obesity due to excess calories ([place]) 06/09/2017  . Cigarette nicotine dependence with nicotine-induced disorder 06/09/2017  . Diabetic polyneuropathy associated with type 2 diabetes mellitus ([place]) 06/09/2017  . Allergic rhinitis due to pollen 06/09/2017  . Mixed hyperlipidemia 06/09/2017  . COPD with acute exacerbation ([place]) 06/09/2017  . Dyspnea on exertion 06/09/2017  . Wheezing 06/09/2017  . Dysuria 06/09/2017  . Snoring 06/09/2017  . Essential hypertension 06/09/2017  . Personal history of other malignant neoplasm of kidney 06/09/2017  . Pain in right hip 06/09/2017  . Impacted cerumen, bilateral 06/09/2017  . Secondary hyperparathyroidism, not elsewhere classified ([place]) 06/09/2017  . Type 2 diabetes mellitus with hyperglycemia (  [place]) 06/09/2017  . Tinea corporis 06/09/2017     Past Surgical History:  Procedure Laterality Date  . BACK SURGERY    . CARDIAC CATHETERIZATION    . CHOLECYSTECTOMY    . COLONOSCOPY WITH PROPOFOL N/A 04/12/2019   Procedure: COLONOSCOPY WITH PROPOFOL;  Surgeon: [name], MD;  Location: [facility]  ENDOSCOPY;  Service: Gastroenterology;  Laterality: N/A;  . ESOPHAGOGASTRODUODENOSCOPY N/A 04/12/2019   Procedure: ESOPHAGOGASTRODUODENOSCOPY (EGD);  Surgeon: [name], MD;  Location: [facility] ENDOSCOPY;  Service: Gastroenterology;  Laterality: N/A;  . ESOPHAGOGASTRODUODENOSCOPY (EGD) WITH PROPOFOL N/A 11/21/2019   Procedure: ESOPHAGOGASTRODUODENOSCOPY (EGD) WITH PROPOFOL;  Surgeon: [name], MD;  Location: [facility] ENDOSCOPY;  Service: Gastroenterology;  Laterality: N/A;  . FLEXIBLE BRONCHOSCOPY Bilateral 05/17/2019   Procedure: FLEXIBLE BRONCHOSCOPY;  Surgeon: [name], MD;  Location: [name] ORS;  Service: Pulmonary;  Laterality: Bilateral;  . left arm surgery    . nephrectomy Left   . PARATHYROIDECTOMY    . RIGHT HEART CATH N/A 03/29/2019   Procedure: RIGHT HEART CATH;  Surgeon: [name], MD;  Location: [place] CV LAB;  Service: Cardiovascular;  Laterality: N/A;     Prior to Admission medications   Medication Sig Start Date End Date Taking? Authorizing Provider  acetaminophen (TYLENOL) 325 MG tablet Take 2 tablets (650 mg total) by mouth every 4 (four) hours as needed for headache or mild pain. 03/05/19   Gouru, Illene Silver, MD  albuterol (VENTOLIN HFA) 108 (90 Base) MCG/ACT inhaler Inhale 2 puffs into the lungs every 6 (six) hours as needed for wheezing or shortness of breath. 03/05/19   Gouru, Illene Silver, MD  amLODipine (NORVASC) 10 MG tablet Take 1 tablet (10 mg total) by mouth daily. 08/01/19   [name], NP  benzonatate (TESSALON PERLES) 100 MG capsule Take 1 capsule (100 mg total) by mouth 3 (three) times daily as needed for cough. 12/10/19 12/09/20  Carrie Mew, MD  Blood Glucose Calibration (TRUE METRIX LEVEL 1) Low SOLN Use as directed 04/04/18   [name], NP  Blood Glucose Monitoring Suppl (TRUE METRIX AIR GLUCOSE METER) DEVI 1 Device by Does not apply route 2 (two) times daily. 04/13/18   [name], NP  ferrous gluconate (FERGON) 324  MG tablet Take 1 tablet (324 mg total) by mouth daily with breakfast. 08/01/19   [name], NP  furosemide (LASIX) 80 MG tablet Hold until outpatient PCP followup due to acute kidney injury. 11/21/19   Enzo Bi, MD  gabapentin (NEURONTIN) 100 MG capsule Take 1 capsule (100 mg total) by mouth in the morning, at noon, and at bedtime. 11/26/19   [name], NP  gemfibrozil (LOPID) 600 MG tablet Take 1 tablet (600 mg total) by mouth 2 (two) times daily with a meal. 06/27/19   Boscia, Greer Ee, NP  glimepiride (AMARYL) 2 MG tablet Take 1 tablet (2 mg total) by mouth at bedtime. 08/02/19   Lavera Guise, MD  glucose blood (TRUE METRIX BLOOD GLUCOSE TEST) test strip Use as instructed twice daily diag E11.65 07/30/19   [name], NP  guaiFENesin (MUCINEX) 600 MG 12 hr tablet Take 1 tablet (600 mg total) by mouth 2 (two) times daily as needed for up to 7 days. 12/10/19 12/17/19  Carrie Mew, MD  hydrALAZINE (APRESOLINE) 100 MG tablet TAKE 1 TABLET THREE TIMES DAILY 10/16/19   [name], NP  hydrOXYzine (ATARAX) 10 MG/5ML syrup Take 5 mLs (10 mg total) by mouth 3 (three)  times daily as needed for itching. 09/24/19   Sable Feil, PA-C  insulin glargine, 1 Unit Dial, (TOUJEO) 300 UNIT/ML Solostar Pen Inject 26 Units into the skin daily. 10/24/19   [name], NP  ipratropium-albuterol (DUONEB) 0.5-2.5 (3) MG/3ML SOLN Take 3 mLs by nebulization every 6 (six) hours as needed. 03/08/19   Kendell Bane, NP  isosorbide mononitrate (IMDUR) 30 MG 24 hr tablet Take 1 tablet (30 mg total) by mouth daily. 12/03/19   [name], MD  loratadine (CLARITIN) 10 MG tablet Take 1 tablet (10 mg total) by mouth daily as needed for allergies. 11/21/19   Enzo Bi, MD  metoprolol succinate (TOPROL-XL) 50 MG 24 hr tablet Take 1 tablet (50 mg total) by mouth at bedtime. Take with or immediately following a meal. 07/10/19   Lavera Guise, MD  naproxen (NAPROSYN) 500 MG tablet Take 1 tablet (500  mg total) by mouth 2 (two) times daily as needed. 11/21/19   Enzo Bi, MD  nitroGLYCERIN (NITROSTAT) 0.4 MG SL tablet Place 1 tablet (0.4 mg total) under the tongue every 5 (five) minutes x 3 doses as needed for chest pain. 05/24/19   Loletha Grayer, MD  NOVOFINE 32G X 6 MM MISC To use with  injections daily 12/13/18   [name], NP  omeprazole (PRILOSEC) 40 MG capsule 1 cap daily Patient taking differently: Take 40 mg by mouth daily.  07/10/19   Lavera Guise, MD  potassium chloride SA (KLOR-CON) 20 MEQ tablet Take 1 tablet (20 mEq total) by mouth 2 (two) times daily. 11/26/19   [name], NP  predniSONE (DELTASONE) 20 MG tablet Take 2 tablets (40 mg total) by mouth daily. 12/03/19   Harvest Dark, MD  rosuvastatin (CRESTOR) 10 MG tablet Take 1 tablet (10 mg total) by mouth daily. 08/23/19 11/21/19  [name], MD  sodium chloride (OCEAN) 0.65 % SOLN nasal spray Place 1 spray into both nostrils as needed for congestion. 06/18/19   Lavina Hamman, MD  tiotropium (SPIRIVA HANDIHALER) 18 MCG inhalation capsule Place 1 capsule (18 mcg total) into inhaler and inhale daily. 08/07/19 11/19/19  [name], NP  TRUEplus Lancets 33G MISC Use as directed twice daily diag E11.65 07/30/19   [name], NP  vitamin B-12 (CYANOCOBALAMIN) 1000 MCG tablet Take 1 tablet (1,000 mcg total) by mouth daily. 11/21/19   Enzo Bi, MD     Allergies Penicillins   Family History  Problem Relation Age of Onset  . Diabetes Mother   . Lung cancer Father   . Diabetes Sister   . Hypertension Sister   . Heart disease Sister   . Cancer Sister   . Bone cancer Brother     Social History Social History   Tobacco Use  . Smoking status: Former Smoker    Types: Cigarettes  . Smokeless tobacco: Former Systems developer    Types: Secondary school teacher  . Vaping Use: Never used  Substance Use Topics  . Alcohol use: No  . Drug use: No    Review of Systems  Constitutional:   No fever or chills.   ENT:   No sore throat. No rhinorrhea. Cardiovascular:   No chest pain or syncope. Respiratory: Chronic shortness of breath and occasional nonproductive cough, chronic baseline Gastrointestinal:   Negative for abdominal pain, vomiting and diarrhea.  Musculoskeletal:   Negative for focal pain or swelling All other systems reviewed and are negative except as documented above  in ROS and HPI.  ____________________________________________   PHYSICAL EXAM:  VITAL SIGNS: ED Triage Vitals  Enc Vitals Group     BP 12/10/19 1227 (!) 153/75     Pulse Rate 12/10/19 1227 67     Resp 12/10/19 1227 16     Temp 12/10/19 1227 98.2 F (36.8 C)     Temp Source 12/10/19 1227 Oral     SpO2 12/10/19 1227 96 %     Weight 12/10/19 1228 249 lb 1.9 oz (113 kg)     Height 12/10/19 1228 _0  (1.803 m)     Head Circumference --      Peak Flow --      Pain Score 12/10/19 1228 0     Pain Loc --      Pain Edu? --      Excl. in [place]? --     Vital signs reviewed, nursing assessments reviewed.   Constitutional:   Alert and oriented. Non-toxic appearance. Eyes:   Conjunctivae are normal. EOMI. PERRL. ENT      Head:   Normocephalic and atraumatic.      Nose:   Wearing a mask.      Mouth/Throat:   Wearing a mask.      Neck:   No meningismus. Full ROM. Hematological/Lymphatic/Immunilogical:   No cervical lymphadenopathy. Cardiovascular:   RRR. Symmetric bilateral radial and DP pulses.  No murmurs. Cap refill less than 2 seconds. Respiratory:   Normal respiratory effort without tachypnea/retractions. Breath sounds are clear and equal bilaterally. No wheezes/rales/rhonchi. Gastrointestinal:   Soft and nontender. Non distended. There is no CVA tenderness.  No rebound, rigidity, or guarding.  Small umbilical hernia, soft nontender and reducible Musculoskeletal:   Normal range of motion in all extremities. No joint effusions.  No lower extremity tenderness.  No edema. Neurologic:   Normal speech and language.   Motor grossly intact. No acute focal neurologic deficits are appreciated.  Skin:    Skin is warm, dry and intact. No rash noted.  No petechiae, purpura, or bullae.  ____________________________________________    LABS (pertinent positives/negatives) (all labs ordered are listed, but only abnormal results are displayed) Labs Reviewed  COMPREHENSIVE METABOLIC PANEL - Abnormal; Notable for the following components:      Result Value   CO2 21 (*)    Glucose, Bld 130 (*)    BUN 53 (*)    Creatinine, Ser 2.01 (*)    GFR calc non Af Amer 34 (*)    GFR calc Af Amer 39 (*)    All other components within normal limits  CBC - Abnormal; Notable for the following components:   RBC 3.90 (*)    Hemoglobin 11.3 (*)    HCT 34.0 (*)    All other components within normal limits  URINALYSIS, COMPLETE (UACMP) WITH MICROSCOPIC - Abnormal; Notable for the following components:   Color, Urine PINK (*)    APPearance CLOUDY (*)    Hgb urine dipstick LARGE (*)    Protein, ur 100 (*)    Leukocytes,Ua TRACE (*)    RBC / HPF >50 (*)    Bacteria, UA RARE (*)    All other components within normal limits  URINE CULTURE  LIPASE, BLOOD   ____________________________________________   EKG    ____________________________________________    RADIOLOGY  CT Renal Stone Study  Result Date: 12/10/2019 CLINICAL DATA:  Hematuria painful urination EXAM: CT ABDOMEN AND PELVIS WITHOUT CONTRAST TECHNIQUE: Multidetector CT imaging of the abdomen and pelvis was  performed following the standard protocol without IV contrast. COMPARISON:  CT 04/24/2019 FINDINGS: Lower chest: Lung bases demonstrate no acute consolidation or effusion. Hepatobiliary: Status post cholecystectomy. No focal hepatic abnormality or biliary dilatation. Pancreas: Unremarkable. No pancreatic ductal dilatation or surrounding inflammatory changes. Spleen: Normal in size without focal abnormality. Adrenals/Urinary Tract: Right adrenal gland is  normal. 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 Status post left nephrectomy. Multiple cysts in the right kidney. Now evident are small hyperdense foci within the right kidney, the largest is seen in the lower pole and measures 18 mm. No hydronephrosis or ureteral stone. The bladder is unremarkable Stomach/Bowel: Stomach is within normal limits. Appendix appears normal. No evidence of bowel wall thickening, distention, or inflammatory changes. 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 Vascular/Lymphatic: Marked aortic atherosclerosis without aneurysm. No suspicious nodes. Reproductive: Prostate slightly enlarged with mass effect on the bladder. Other: No free air or free fluid. Moderate fat containing umbilical hernia. Musculoskeletal: No acute or suspicious osseous abnormality. IMPRESSION: 1. Negative for hydronephrosis or ureteral stone. Multiple right renal cysts. Now evident are small hyperdense foci within the right kidney, the largest measuring up to 18 mm, possibly representing hemorrhagic or proteinaceous cyst though further characterisation limited without intravenous contrast. When the patient is clinically stable and able to follow directions and hold their breath (preferably as an outpatient) further evaluation with dedicated abdominal MRI should be considered. 2. 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 since 05/11/2019, now measuring 7.6 cm, but increased in size since prior study from 2015. Surgical consultation previously suggested. 3. 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 4. Moderate fat containing umbilical hernia. Aortic Atherosclerosis (ICD10-I70.0). Electronically Signed   By: Donavan Foil M.D.   On: 12/10/2019 15:39    ____________________________________________   PROCEDURES Procedures  ____________________________________________  DIFFERENTIAL DIAGNOSIS   Ureterolithiasis, kidney mass,  cystitis, coagulopathy, thrombocytopenia  CLINICAL IMPRESSION / ASSESSMENT AND PLAN / ED COURSE  Medications ordered in the ED: Medications  sodium chloride flush (NS) 0.9 % injection 3 mL (3 mLs Intravenous Not Given 12/10/19 1433)    Pertinent labs & imaging results that were available during my care of the patient were reviewed by me and considered in my medical decision making (see chart for details).  Larry Callahan was evaluated in Emergency Department on 12/10/2019 for the symptoms described in the history of present illness. He was evaluated in the context of the global COVID-19 pandemic, which necessitated consideration that the patient might be at risk for infection with the SARS-CoV-2 virus that causes COVID-19. Institutional protocols and algorithms that pertain to the evaluation of patients at risk for COVID-19 are in a state of rapid change based on information released by regulatory bodies including the CDC and federal and state organizations. These policies and algorithms were followed during the patient's care in the ED.   Patient presents with hematuria that started today.  Vital signs are unremarkable, no acute pain syndrome or other complaints.  Exam is benign and reassuring.   Labs show baseline CKD.  CBC shows normal platelet count and baseline chronic anemia.  UA shows large red blood cells without frank evidence of infection.  Will obtain CT renal stone protocol without contrast due to his CKD and prior nephrectomy.   ----------------------------------------- 4:09 PM on 12/10/2019 -----------------------------------------  CT shows small hemorrhagic cyst on the kidney which explain the hematuria.  He also has an adrenal mass which was seen previously.  I informed the patient of these findings and recommended he follow-up with urology this week for further assessment.  Advised him that an outpatient MRI may be needed.       ____________________________________________   FINAL CLINICAL IMPRESSION(S) / ED DIAGNOSES    Final diagnoses:  Gross hematuria  Renal cyst, native, hemorrhage  Type 2 diabetes mellitus without complication, with long-term current use of insulin (HCC)  Stage 3b chronic kidney disease  Adrenal mass [facility])     ED Discharge Orders         Ordered    benzonatate (TESSALON PERLES) 100 MG capsule  3 times daily PRN     Discontinue  Reprint     12/10/19 1608    guaiFENesin (MUCINEX) 600 MG 12 hr tablet  2 times daily PRN     Discontinue  Reprint     12/10/19 1608          Portions of this note were generated with dragon dictation software. Dictation errors may occur despite best attempts at proofreading.   Carrie Mew, MD 12/10/19 [phone]

## 2019-12-10 NOTE — Telephone Encounter (Signed)
Patient is passing blood in urine, I advised pt he needs to be seen and get treated, he stated he will get seen with [facility]. Beth

## 2019-12-12 LAB — URINE CULTURE: Culture: >=100000 — AB

## 2019-12-13 ENCOUNTER — Ambulatory Visit: Payer: Medicare PPO | Admitting: Nurse Practitioner

## 2019-12-17 ENCOUNTER — Ambulatory Visit: Payer: Medicare PPO

## 2019-12-21 ENCOUNTER — Emergency Department: Payer: Medicare PPO

## 2019-12-21 ENCOUNTER — Emergency Department
Admission: EM | Admit: 2019-12-21 | Discharge: 2019-12-21 | Disposition: A | Discharge status: Home / Self Care | Payer: Medicare PPO | Attending: Emergency Medicine | Admitting: Emergency Medicine

## 2019-12-21 ENCOUNTER — Other Ambulatory Visit: Payer: Self-pay

## 2019-12-21 DIAGNOSIS — R079 Chest pain, unspecified: Secondary | ICD-10-CM | POA: Insufficient documentation

## 2019-12-21 DIAGNOSIS — N183 Chronic kidney disease, stage 3 unspecified: Secondary | ICD-10-CM | POA: Diagnosis not present

## 2019-12-21 DIAGNOSIS — Z794 Long term (current) use of insulin: Secondary | ICD-10-CM | POA: Diagnosis not present

## 2019-12-21 DIAGNOSIS — R0602 Shortness of breath: Secondary | ICD-10-CM | POA: Diagnosis not present

## 2019-12-21 DIAGNOSIS — I2510 Atherosclerotic heart disease of native coronary artery without angina pectoris: Secondary | ICD-10-CM | POA: Insufficient documentation

## 2019-12-21 DIAGNOSIS — I5033 Acute on chronic diastolic (congestive) heart failure: Secondary | ICD-10-CM | POA: Diagnosis not present

## 2019-12-21 DIAGNOSIS — Z87891 Personal history of nicotine dependence: Secondary | ICD-10-CM | POA: Diagnosis not present

## 2019-12-21 DIAGNOSIS — R0789 Other chest pain: Secondary | ICD-10-CM | POA: Diagnosis not present

## 2019-12-21 DIAGNOSIS — I10 Essential (primary) hypertension: Secondary | ICD-10-CM | POA: Diagnosis not present

## 2019-12-21 DIAGNOSIS — E1165 Type 2 diabetes mellitus with hyperglycemia: Secondary | ICD-10-CM | POA: Diagnosis not present

## 2019-12-21 DIAGNOSIS — Z955 Presence of coronary angioplasty implant and graft: Secondary | ICD-10-CM | POA: Insufficient documentation

## 2019-12-21 DIAGNOSIS — I130 Hypertensive heart and chronic kidney disease with heart failure and stage 1 through stage 4 chronic kidney disease, or unspecified chronic kidney disease: Secondary | ICD-10-CM | POA: Insufficient documentation

## 2019-12-21 DIAGNOSIS — E1142 Type 2 diabetes mellitus with diabetic polyneuropathy: Secondary | ICD-10-CM | POA: Insufficient documentation

## 2019-12-21 DIAGNOSIS — J441 Chronic obstructive pulmonary disease with (acute) exacerbation: Secondary | ICD-10-CM | POA: Diagnosis not present

## 2019-12-21 DIAGNOSIS — G4489 Other headache syndrome: Secondary | ICD-10-CM | POA: Diagnosis not present

## 2019-12-21 DIAGNOSIS — Z79899 Other long term (current) drug therapy: Secondary | ICD-10-CM | POA: Diagnosis not present

## 2019-12-21 DIAGNOSIS — E1122 Type 2 diabetes mellitus with diabetic chronic kidney disease: Secondary | ICD-10-CM | POA: Diagnosis not present

## 2019-12-21 DIAGNOSIS — Z7951 Long term (current) use of inhaled steroids: Secondary | ICD-10-CM | POA: Diagnosis not present

## 2019-12-21 LAB — TROPONIN I (HIGH SENSITIVITY)
Troponin I (High Sensitivity): 11 ng/L (ref <18)
Troponin I (High Sensitivity): 11 ng/L (ref <18)

## 2019-12-21 LAB — COMPREHENSIVE METABOLIC PANEL
ALT: 12 U/L (ref 0–44)
AST: 19 U/L (ref 15–41)
Albumin: 3.3 g/dL — ABNORMAL LOW (ref 3.5–5.0)
Alkaline Phosphatase: 80 U/L (ref 38–126)
Anion gap: 9 (ref 5–15)
BUN: 43 mg/dL — ABNORMAL HIGH (ref 8–23)
CO2: 21 mmol/L — ABNORMAL LOW (ref 22–32)
Calcium: 8.7 mg/dL — ABNORMAL LOW (ref 8.9–10.3)
Chloride: 107 mmol/L (ref 98–111)
Creatinine, Ser: 2 mg/dL — ABNORMAL HIGH (ref 0.61–1.24)
GFR calc Af Amer: 39 mL/min — ABNORMAL LOW (ref >60)
GFR calc non Af Amer: 34 mL/min — ABNORMAL LOW (ref >60)
Glucose, Bld: 277 mg/dL — ABNORMAL HIGH (ref 70–99)
Potassium: 3.9 mmol/L (ref 3.5–5.1)
Sodium: 137 mmol/L (ref 135–145)
Total Bilirubin: 0.5 mg/dL (ref 0.3–1.2)
Total Protein: 6.9 g/dL (ref 6.5–8.1)

## 2019-12-21 LAB — CBC
HCT: 34.6 % — ABNORMAL LOW (ref 39.0–52.0)
Hemoglobin: 11.3 g/dL — ABNORMAL LOW (ref 13.0–17.0)
MCH: 28.8 pg (ref 26.0–34.0)
MCHC: 32.7 g/dL (ref 30.0–36.0)
MCV: 88 fL (ref 80.0–100.0)
Platelets: 208 10*3/uL (ref 150–400)
RBC: 3.93 MIL/uL — ABNORMAL LOW (ref 4.22–5.81)
RDW: 13.6 % (ref 11.5–15.5)
WBC: 7.4 10*3/uL (ref 4.0–10.5)
nRBC: 0 % (ref 0.0–0.2)

## 2019-12-21 LAB — BRAIN NATRIURETIC PEPTIDE: B Natriuretic Peptide: 44.5 pg/mL (ref 0.0–100.0)

## 2019-12-21 MED ORDER — PREDNISONE 10 MG PO TABS
10.0000 mg | ORAL_TABLET | Freq: Every day | ORAL | 0 refills | Status: DC
Start: 2019-12-21 — End: 2020-01-08

## 2019-12-21 MED ORDER — MORPHINE SULFATE (PF) 4 MG/ML IV SOLN
4.0000 mg | Freq: Once | INTRAVENOUS | Status: AC
  Administered 2019-12-21: 4 mg via INTRAVENOUS
  Filled 2019-12-21: qty 1

## 2019-12-21 MED ORDER — ONDANSETRON HCL 4 MG/2ML IJ SOLN
4.0000 mg | Freq: Once | INTRAMUSCULAR | Status: AC
  Administered 2019-12-21: 4 mg via INTRAVENOUS
  Filled 2019-12-21: qty 2

## 2019-12-21 MED ORDER — AZITHROMYCIN 250 MG PO TABS
ORAL_TABLET | ORAL | 0 refills | Status: AC
Start: 2019-12-21 — End: 2019-12-26

## 2019-12-21 NOTE — ED Triage Notes (Signed)
Pt arrives via EMS after having chest pain since last night- pt states it is mid chest with numbness and SHOB- pt began with HA this morning- pt was given 325 ASA and 1 spray of nitro- per EMS pt cbg was 299- pt A&O x4

## 2019-12-21 NOTE — ED Provider Notes (Signed)
[facility] Emergency Department Provider Note  Time seen: 11:10 AM  I have reviewed the triage vital signs and the nursing notes.   HISTORY  Chief Complaint Chest Pain   HPI Larry Callahan is a 64 y.o. male with a past medical history of CAD, diabetes, CHF, gastric reflux, hypertension, hyperlipidemia, obesity, presents emergency department for chest pain or shortness of breath.  According to the patient since yesterday he has been experiencing chest pain and shortness of breath.  States it worsened today while he was at the grocery store so he called EMS.  Patient currently states 8/10 pressure/numbness feeling to the center of his chest.  Mild shortness of breath especially with exertion.  No leg pain or swelling.  No abdominal pain no nausea or vomiting.  No diaphoresis.  Patient received aspirin and nitroglycerin by EMS without relief.   Past Medical History:  Diagnosis Date  . Allergy   . Coronary artery disease   . Diabetes mellitus without complication ([place])   . Diastolic CHF ([place])   . GERD (gastroesophageal reflux disease)   . Hyperlipidemia   . Hypertension   . Renal insufficiency   . Sleep apnea     Patient Active Problem List   Diagnosis Date Noted  . Hospital discharge follow-up 11/26/2019  . Cellulitis of left hand 09/26/2019  . Left-sided weakness 08/08/2019  . BMI 35.0-35.9,adult 08/08/2019  . Acute right hip pain 06/17/2019  . Inability to ambulate due to hip 06/17/2019  . CKD stage 3 due to type 2 diabetes mellitus ([place])   . Hypervolemia   . Acute on chronic diastolic CHF (congestive heart failure) ([place])   . Lobar pneumonia ([place])   . Type 2 diabetes mellitus with stage 3b chronic kidney disease, with long-term current use of insulin ([place])   . Hypokalemia   . Hypomagnesemia   . Full code status 05/20/2019  . Pleuritic chest pain   . CAP (community acquired pneumonia) 05/19/2019  . AKI (acute kidney injury) ([place]) 05/09/2019   . Hemoptysis 04/09/2019  . Shortness of breath 03/26/2019  . Uncontrolled type 2 diabetes mellitus with insulin therapy ([place]) 03/12/2019  . Acute on chronic heart failure with preserved ejection fraction (HFpEF) ([place])   . Acute kidney injury superimposed on chronic kidney disease ([place])   . CHF (congestive heart failure) ([place]) 03/01/2019  . Diastolic dysfunction 61/60/7371  . Iron deficiency anemia 12/31/2018  . Atopic dermatitis 11/24/2018  . Screening for colon cancer 11/06/2017  . Uncontrolled type 2 diabetes mellitus with hyperglycemia ([place]) 11/06/2017  . Dermatitis, unspecified 06/09/2017  . Atherosclerotic heart disease of native coronary artery without angina pectoris 06/09/2017  . 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 Obstructive sleep apnea, adult 06/09/2017  . Morbid (severe) obesity due to excess calories ([place]) 06/09/2017  . Cigarette nicotine dependence with nicotine-induced disorder 06/09/2017  . Diabetic polyneuropathy associated with type 2 diabetes mellitus ([place]) 06/09/2017  . Allergic rhinitis due to pollen 06/09/2017  . Mixed hyperlipidemia 06/09/2017  . COPD with acute exacerbation ([place]) 06/09/2017  . Dyspnea on exertion 06/09/2017  . Wheezing 06/09/2017  . Dysuria 06/09/2017  . Snoring 06/09/2017  . Essential hypertension 06/09/2017  . Personal history of other malignant neoplasm of kidney 06/09/2017  . Pain in right hip 06/09/2017  . Impacted cerumen, bilateral 06/09/2017  . Secondary hyperparathyroidism, not elsewhere classified ([place]) 06/09/2017  . Type 2 diabetes mellitus with hyperglycemia ([place]) 06/09/2017  . Tinea corporis 06/09/2017  Past Surgical History:  Procedure Laterality Date  . BACK SURGERY    . CARDIAC CATHETERIZATION    . CHOLECYSTECTOMY    . COLONOSCOPY WITH PROPOFOL N/A 04/12/2019   Procedure: COLONOSCOPY WITH PROPOFOL;  Surgeon: [name], MD;  Location: [facility] ENDOSCOPY;  Service:  Gastroenterology;  Laterality: N/A;  . ESOPHAGOGASTRODUODENOSCOPY N/A 04/12/2019   Procedure: ESOPHAGOGASTRODUODENOSCOPY (EGD);  Surgeon: [name], MD;  Location: [facility] ENDOSCOPY;  Service: Gastroenterology;  Laterality: N/A;  . ESOPHAGOGASTRODUODENOSCOPY (EGD) WITH PROPOFOL N/A 11/21/2019   Procedure: ESOPHAGOGASTRODUODENOSCOPY (EGD) WITH PROPOFOL;  Surgeon: [name], MD;  Location: [facility] ENDOSCOPY;  Service: Gastroenterology;  Laterality: N/A;  . FLEXIBLE BRONCHOSCOPY Bilateral 05/17/2019   Procedure: FLEXIBLE BRONCHOSCOPY;  Surgeon: [name], MD;  Location: [name] ORS;  Service: Pulmonary;  Laterality: Bilateral;  . left arm surgery    . nephrectomy Left   . PARATHYROIDECTOMY    . RIGHT HEART CATH N/A 03/29/2019   Procedure: RIGHT HEART CATH;  Surgeon: [name], MD;  Location: [facility] CV LAB;  Service: Cardiovascular;  Laterality: N/A;    Prior to Admission medications   Medication Sig Start Date End Date Taking? Authorizing Provider  acetaminophen (TYLENOL) 325 MG tablet Take 2 tablets (650 mg total) by mouth every 4 (four) hours as needed for headache or mild pain. 03/05/19   Gouru, Illene Silver, MD  albuterol (VENTOLIN HFA) 108 (90 Base) MCG/ACT inhaler Inhale 2 puffs into the lungs every 6 (six) hours as needed for wheezing or shortness of breath. 03/05/19   Gouru, Illene Silver, MD  amLODipine (NORVASC) 10 MG tablet Take 1 tablet (10 mg total) by mouth daily. 08/01/19   [name], NP  benzonatate (TESSALON PERLES) 100 MG capsule Take 1 capsule (100 mg total) by mouth 3 (three) times daily as needed for cough. 12/10/19 12/09/20  Carrie Mew, MD  Blood Glucose Calibration (TRUE METRIX LEVEL 1) Low SOLN Use as directed 04/04/18   [name], NP  Blood Glucose Monitoring Suppl (TRUE METRIX AIR GLUCOSE METER) DEVI 1 Device by Does not apply route 2 (two) times daily. 04/13/18   [name], NP  ferrous gluconate (FERGON) 324 MG tablet Take 1 tablet  (324 mg total) by mouth daily with breakfast. 08/01/19   [name], NP  furosemide (LASIX) 80 MG tablet Hold until outpatient PCP followup due to acute kidney injury. 11/21/19   Enzo Bi, MD  gabapentin (NEURONTIN) 100 MG capsule Take 1 capsule (100 mg total) by mouth in the morning, at noon, and at bedtime. 11/26/19   [name], NP  gemfibrozil (LOPID) 600 MG tablet Take 1 tablet (600 mg total) by mouth 2 (two) times daily with a meal. 06/27/19   Boscia, Greer Ee, NP  glimepiride (AMARYL) 2 MG tablet Take 1 tablet (2 mg total) by mouth at bedtime. 08/02/19   Lavera Guise, MD  glucose blood (TRUE METRIX BLOOD GLUCOSE TEST) test strip Use as instructed twice daily diag E11.65 07/30/19   [name], NP  hydrALAZINE (APRESOLINE) 100 MG tablet TAKE 1 TABLET THREE TIMES DAILY 10/16/19   [name], NP  hydrOXYzine (ATARAX) 10 MG/5ML syrup Take 5 mLs (10 mg total) by mouth 3 (three) times daily as needed for itching. 09/24/19   Sable Feil, PA-C  insulin glargine, 1 Unit Dial, (TOUJEO) 300 UNIT/ML Solostar Pen Inject 26 Units into the skin daily. 10/24/19   [name], NP  ipratropium-albuterol (DUONEB) 0.5-2.5 (3) MG/3ML SOLN  Take 3 mLs by nebulization every 6 (six) hours as needed. 03/08/19   Kendell Bane, NP  isosorbide mononitrate (IMDUR) 30 MG 24 hr tablet Take 1 tablet (30 mg total) by mouth daily. 12/03/19   [name], MD  loratadine (CLARITIN) 10 MG tablet Take 1 tablet (10 mg total) by mouth daily as needed for allergies. 11/21/19   Enzo Bi, MD  metoprolol succinate (TOPROL-XL) 50 MG 24 hr tablet Take 1 tablet (50 mg total) by mouth at bedtime. Take with or immediately following a meal. 07/10/19   Lavera Guise, MD  naproxen (NAPROSYN) 500 MG tablet Take 1 tablet (500 mg total) by mouth 2 (two) times daily as needed. 11/21/19   Enzo Bi, MD  nitroGLYCERIN (NITROSTAT) 0.4 MG SL tablet Place 1 tablet (0.4 mg total) under the tongue every 5 (five) minutes x 3  doses as needed for chest pain. 05/24/19   Loletha Grayer, MD  NOVOFINE 32G X 6 MM MISC To use with [place] injections daily 12/13/18   [name], NP  omeprazole (PRILOSEC) 40 MG capsule 1 cap daily Patient taking differently: Take 40 mg by mouth daily.  07/10/19   Lavera Guise, MD  potassium chloride SA (KLOR-CON) 20 MEQ tablet Take 1 tablet (20 mEq total) by mouth 2 (two) times daily. 11/26/19   [name], NP  predniSONE (DELTASONE) 20 MG tablet Take 2 tablets (40 mg total) by mouth daily. 12/03/19   Harvest Dark, MD  rosuvastatin (CRESTOR) 10 MG tablet Take 1 tablet (10 mg total) by mouth daily. 08/23/19 11/21/19  [name], MD  sodium chloride (OCEAN) 0.65 % SOLN nasal spray Place 1 spray into both nostrils as needed for congestion. 06/18/19   Lavina Hamman, MD  tiotropium (SPIRIVA HANDIHALER) 18 MCG inhalation capsule Place 1 capsule (18 mcg total) into inhaler and inhale daily. 08/07/19 11/19/19  [name], NP  TRUEplus Lancets 33G MISC Use as directed twice daily diag E11.65 07/30/19   [name], NP  vitamin B-12 (CYANOCOBALAMIN) 1000 MCG tablet Take 1 tablet (1,000 mcg total) by mouth daily. 11/21/19   Enzo Bi, MD    Allergies  Allergen Reactions  . Penicillins Rash    Family History  Problem Relation Age of Onset  . Diabetes Mother   . Lung cancer Father   . Diabetes Sister   . Hypertension Sister   . Heart disease Sister   . Cancer Sister   . Bone cancer Brother     Social History Social History   Tobacco Use  . Smoking status: Former Smoker    Types: Cigarettes  . Smokeless tobacco: Former Systems developer    Types: Secondary school teacher  . Vaping Use: Never used  Substance Use Topics  . Alcohol use: No  . Drug use: No    Review of Systems Constitutional: Negative for fever. Cardiovascular: Central chest pain Respiratory: Positive for shortness of breath Gastrointestinal: Negative for abdominal pain, vomiting Musculoskeletal: Negative for  musculoskeletal complaints Neurological: Negative for headache All other ROS negative  ____________________________________________   PHYSICAL EXAM:  Constitutional: Alert and oriented. Well appearing and in no distress. Eyes: Normal exam ENT      Head: Normocephalic and atraumatic.      Mouth/Throat: Mucous membranes are moist. Cardiovascular: Normal rate, regular rhythm. Respiratory: Normal respiratory effort without tachypnea nor retractions. Breath sounds are clear  Gastrointestinal: Soft and nontender. No distention.  Musculoskeletal: Nontender with normal range of motion in  all extremities. Neurologic:  Normal speech and language. No gross focal neurologic deficits  Skin:  Skin is warm, dry and intact.  Psychiatric: Mood and affect are normal.   ____________________________________________    EKG  EKG viewed and interpreted by myself shows a normal sinus rhythm 81 bpm with a narrow QRS, normal axis, normal intervals nonspecific ST changes.  ____________________________________________    RADIOLOGY  IMPRESSION:  Mild chronic interstitial thickening is stable. No edema or airspace  opacity. Cardiac silhouette normal.   ____________________________________________   INITIAL IMPRESSION / ASSESSMENT AND PLAN / ED COURSE  Pertinent labs & imaging results that were available during my care of the patient were reviewed by me and considered in my medical decision making (see chart for details).   Patient presents emergency department for chest pain shortness of breath onset since yesterday worse today while at the grocery store.  Overall the patient appears well, no acute distress.  Patient has reassuring vitals at this time.  We will check labs including cardiac enzymes, BNP, obtain a chest x-ray, EKG continue to closely monitor.  Patient agreeable to plan of care.  Patient's work-up is essentially reassuring.  Troponin of 11.  BNP of 44.  Chest x-ray shows chronic  interstitial thickening without edema or new acute opacity.  EKG is reassuring.  Patient did call me into the room at 1 point he had spit out sputum with blood streaked sputum in it.  Patient has a history of this as well has had multiple work-ups in the past showing no significant finding.  However given the blood-streaked to his sputum we will cover with an antibiotic and place the patient on prednisone.  Patient has chronic renal insufficiency could not tolerate a contrasted CT of the chest.  Has had noncontrasted CTs in the past of the chest for similar presentations with no significant findings.  Avion Kutzer Redler was evaluated in Emergency Department on 12/21/2019 for the symptoms described in the history of present illness. He was evaluated in the context of the global COVID-19 pandemic, which necessitated consideration that the patient might be at risk for infection with the SARS-CoV-2 virus that causes COVID-19. Institutional protocols and algorithms that pertain to the evaluation of patients at risk for COVID-19 are in a state of rapid change based on information released by regulatory bodies including the CDC and federal and state organizations. These policies and algorithms were followed during the patient's care in the ED.  ____________________________________________   FINAL CLINICAL IMPRESSION(S) / ED DIAGNOSES  Chest pain Shortness of breath   Harvest Dark, MD 12/21/19 1407

## 2019-12-21 NOTE — ED Notes (Signed)
Pt taken off monitoring equipment to use bathroom 

## 2020-01-06 ENCOUNTER — Other Ambulatory Visit: Payer: Self-pay

## 2020-01-06 MED ORDER — HYDRALAZINE HCL 100 MG PO TABS: 100.0000 mg | ORAL_TABLET | Freq: Three times a day (TID) | ORAL | 1 refills | Status: DC

## 2020-01-08 ENCOUNTER — Encounter: Payer: Self-pay | Admitting: Emergency Medicine

## 2020-01-08 ENCOUNTER — Emergency Department: Payer: Medicare PPO

## 2020-01-08 ENCOUNTER — Other Ambulatory Visit: Payer: Self-pay

## 2020-01-08 ENCOUNTER — Emergency Department
Admission: EM | Admit: 2020-01-08 | Discharge: 2020-01-08 | Disposition: A | Discharge status: Home / Self Care | Payer: Medicare PPO | Source: Home / Self Care | Attending: Emergency Medicine | Admitting: Emergency Medicine

## 2020-01-08 DIAGNOSIS — R0602 Shortness of breath: Secondary | ICD-10-CM | POA: Insufficient documentation

## 2020-01-08 DIAGNOSIS — I2510 Atherosclerotic heart disease of native coronary artery without angina pectoris: Secondary | ICD-10-CM | POA: Insufficient documentation

## 2020-01-08 DIAGNOSIS — E1122 Type 2 diabetes mellitus with diabetic chronic kidney disease: Secondary | ICD-10-CM | POA: Insufficient documentation

## 2020-01-08 DIAGNOSIS — J449 Chronic obstructive pulmonary disease, unspecified: Secondary | ICD-10-CM | POA: Insufficient documentation

## 2020-01-08 DIAGNOSIS — N1832 Chronic kidney disease, stage 3b: Secondary | ICD-10-CM | POA: Insufficient documentation

## 2020-01-08 DIAGNOSIS — G4733 Obstructive sleep apnea (adult) (pediatric): Secondary | ICD-10-CM | POA: Insufficient documentation

## 2020-01-08 DIAGNOSIS — Z794 Long term (current) use of insulin: Secondary | ICD-10-CM | POA: Insufficient documentation

## 2020-01-08 DIAGNOSIS — R2689 Other abnormalities of gait and mobility: Secondary | ICD-10-CM | POA: Insufficient documentation

## 2020-01-08 DIAGNOSIS — R58 Hemorrhage, not elsewhere classified: Secondary | ICD-10-CM | POA: Diagnosis not present

## 2020-01-08 DIAGNOSIS — R531 Weakness: Secondary | ICD-10-CM | POA: Insufficient documentation

## 2020-01-08 DIAGNOSIS — Z79899 Other long term (current) drug therapy: Secondary | ICD-10-CM | POA: Insufficient documentation

## 2020-01-08 DIAGNOSIS — Z87891 Personal history of nicotine dependence: Secondary | ICD-10-CM | POA: Diagnosis not present

## 2020-01-08 DIAGNOSIS — I5022 Chronic systolic (congestive) heart failure: Secondary | ICD-10-CM | POA: Insufficient documentation

## 2020-01-08 DIAGNOSIS — Z20822 Contact with and (suspected) exposure to covid-19: Secondary | ICD-10-CM | POA: Insufficient documentation

## 2020-01-08 DIAGNOSIS — K920 Hematemesis: Secondary | ICD-10-CM | POA: Diagnosis not present

## 2020-01-08 DIAGNOSIS — R05 Cough: Secondary | ICD-10-CM | POA: Diagnosis not present

## 2020-01-08 DIAGNOSIS — I130 Hypertensive heart and chronic kidney disease with heart failure and stage 1 through stage 4 chronic kidney disease, or unspecified chronic kidney disease: Secondary | ICD-10-CM | POA: Diagnosis not present

## 2020-01-08 DIAGNOSIS — E119 Type 2 diabetes mellitus without complications: Secondary | ICD-10-CM | POA: Insufficient documentation

## 2020-01-08 DIAGNOSIS — E1142 Type 2 diabetes mellitus with diabetic polyneuropathy: Secondary | ICD-10-CM | POA: Insufficient documentation

## 2020-01-08 DIAGNOSIS — J441 Chronic obstructive pulmonary disease with (acute) exacerbation: Secondary | ICD-10-CM | POA: Diagnosis not present

## 2020-01-08 DIAGNOSIS — Z85528 Personal history of other malignant neoplasm of kidney: Secondary | ICD-10-CM | POA: Insufficient documentation

## 2020-01-08 DIAGNOSIS — E1165 Type 2 diabetes mellitus with hyperglycemia: Secondary | ICD-10-CM | POA: Insufficient documentation

## 2020-01-08 DIAGNOSIS — Z6835 Body mass index (BMI) 35.0-35.9, adult: Secondary | ICD-10-CM | POA: Diagnosis not present

## 2020-01-08 DIAGNOSIS — E785 Hyperlipidemia, unspecified: Secondary | ICD-10-CM | POA: Insufficient documentation

## 2020-01-08 DIAGNOSIS — I509 Heart failure, unspecified: Secondary | ICD-10-CM | POA: Insufficient documentation

## 2020-01-08 DIAGNOSIS — R042 Hemoptysis: Secondary | ICD-10-CM | POA: Diagnosis not present

## 2020-01-08 LAB — CBC WITH DIFFERENTIAL/PLATELET
Abs Immature Granulocytes: 0.02 10*3/uL (ref 0.00–0.07)
Basophils Absolute: 0.1 10*3/uL (ref 0.0–0.1)
Basophils Relative: 1 %
Eosinophils Absolute: 0.1 10*3/uL (ref 0.0–0.5)
Eosinophils Relative: 1 %
HCT: 35 % — ABNORMAL LOW (ref 39.0–52.0)
Hemoglobin: 11.1 g/dL — ABNORMAL LOW (ref 13.0–17.0)
Immature Granulocytes: 0 %
Lymphocytes Relative: 20 %
Lymphs Abs: 1.3 10*3/uL (ref 0.7–4.0)
MCH: 29.1 pg (ref 26.0–34.0)
MCHC: 31.7 g/dL (ref 30.0–36.0)
MCV: 91.6 fL (ref 80.0–100.0)
Monocytes Absolute: 0.4 10*3/uL (ref 0.1–1.0)
Monocytes Relative: 6 %
Neutro Abs: 4.6 10*3/uL (ref 1.7–7.7)
Neutrophils Relative %: 72 %
Platelets: 201 10*3/uL (ref 150–400)
RBC: 3.82 MIL/uL — ABNORMAL LOW (ref 4.22–5.81)
RDW: 13.8 % (ref 11.5–15.5)
WBC: 6.4 10*3/uL (ref 4.0–10.5)
nRBC: 0 % (ref 0.0–0.2)

## 2020-01-08 LAB — COMPREHENSIVE METABOLIC PANEL
ALT: 15 U/L (ref 0–44)
AST: 19 U/L (ref 15–41)
Albumin: 3.7 g/dL (ref 3.5–5.0)
Alkaline Phosphatase: 73 U/L (ref 38–126)
Anion gap: 12 (ref 5–15)
BUN: 58 mg/dL — ABNORMAL HIGH (ref 8–23)
CO2: 23 mmol/L (ref 22–32)
Calcium: 9 mg/dL (ref 8.9–10.3)
Chloride: 103 mmol/L (ref 98–111)
Creatinine, Ser: 1.96 mg/dL — ABNORMAL HIGH (ref 0.61–1.24)
GFR calc Af Amer: 40 mL/min — ABNORMAL LOW (ref >60)
GFR calc non Af Amer: 35 mL/min — ABNORMAL LOW (ref >60)
Glucose, Bld: 268 mg/dL — ABNORMAL HIGH (ref 70–99)
Potassium: 4.3 mmol/L (ref 3.5–5.1)
Sodium: 138 mmol/L (ref 135–145)
Total Bilirubin: 0.6 mg/dL (ref 0.3–1.2)
Total Protein: 7.3 g/dL (ref 6.5–8.1)

## 2020-01-08 MED ORDER — PREDNISONE 50 MG PO TABS
50.0000 mg | ORAL_TABLET | Freq: Every day | ORAL | 0 refills | Status: DC
Start: 2020-01-08 — End: 2020-01-30

## 2020-01-08 MED ORDER — IPRATROPIUM-ALBUTEROL 0.5-2.5 (3) MG/3ML IN SOLN
3.0000 mL | Freq: Once | RESPIRATORY_TRACT | Status: AC
  Administered 2020-01-08: 3 mL via RESPIRATORY_TRACT
  Filled 2020-01-08: qty 3

## 2020-01-08 NOTE — ED Triage Notes (Signed)
Pt in via EMS from home with c/o coughing up blood and SOB. Pt was seen this am for the same and told to come back if needed.

## 2020-01-08 NOTE — ED Provider Notes (Signed)
[facility] Emergency Department Provider Note   ____________________________________________    I have reviewed the triage vital signs and the nursing notes.   HISTORY  Chief Complaint Shortness of Breath     HPI Larry Callahan is a 64 y.o. male with history of coronary artery disease, diabetes who complains of mild shortness of breath and occasional cough with small amount of blood in the sputum.  Patient reports he has had this before.  He denies pleurisy.  Denies chest pain.  No fevers or chills.  No nausea or vomiting.  Does use albuterol at home, reports a diagnosis of COPD, does not smoke.   Past Medical History:  Diagnosis Date  . Allergy   . Coronary artery disease   . Diabetes mellitus without complication ([place])   . Diastolic CHF ([place])   . GERD (gastroesophageal reflux disease)   . Hyperlipidemia   . Hypertension   . Renal insufficiency   . Sleep apnea     Patient Active Problem List   Diagnosis Date Noted  . Hospital discharge follow-up 11/26/2019  . Cellulitis of left hand 09/26/2019  . Left-sided weakness 08/08/2019  . BMI 35.0-35.9,adult 08/08/2019  . Acute right hip pain 06/17/2019  . Inability to ambulate due to hip 06/17/2019  . CKD stage 3 due to type 2 diabetes mellitus ([place])   . Hypervolemia   . Acute on chronic diastolic CHF (congestive heart failure) ([place])   . Lobar pneumonia ([place])   . Type 2 diabetes mellitus with stage 3b chronic kidney disease, with long-term current use of insulin ([place])   . Hypokalemia   . Hypomagnesemia   . Full code status 05/20/2019  . Pleuritic chest pain   . CAP (community acquired pneumonia) 05/19/2019  . AKI (acute kidney injury) ([place]) 05/09/2019  . Hemoptysis 04/09/2019  . Shortness of breath 03/26/2019  . Uncontrolled type 2 diabetes mellitus with insulin therapy ([place]) 03/12/2019  . Acute on chronic heart failure with preserved ejection fraction (HFpEF) ([place])   . Acute  kidney injury superimposed on chronic kidney disease ([place])   . CHF (congestive heart failure) ([place]) 03/01/2019  . Diastolic dysfunction 90/93/1121  . Iron deficiency anemia 12/31/2018  . Atopic dermatitis 11/24/2018  . Screening for colon cancer 11/06/2017  . Uncontrolled type 2 diabetes mellitus with hyperglycemia ([place]) 11/06/2017  . Dermatitis, unspecified 06/09/2017  . Atherosclerotic heart disease of native coronary artery without angina pectoris 06/09/2017  . 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 Obstructive sleep apnea, adult 06/09/2017  . Morbid (severe) obesity due to excess calories ([place]) 06/09/2017  . Cigarette nicotine dependence with nicotine-induced disorder 06/09/2017  . Diabetic polyneuropathy associated with type 2 diabetes mellitus ([place]) 06/09/2017  . Allergic rhinitis due to pollen 06/09/2017  . Mixed hyperlipidemia 06/09/2017  . COPD with acute exacerbation ([place]) 06/09/2017  . Dyspnea on exertion 06/09/2017  . Wheezing 06/09/2017  . Dysuria 06/09/2017  . Snoring 06/09/2017  . Essential hypertension 06/09/2017  . Personal history of other malignant neoplasm of kidney 06/09/2017  . Pain in right hip 06/09/2017  . Impacted cerumen, bilateral 06/09/2017  . Secondary hyperparathyroidism, not elsewhere classified ([place]) 06/09/2017  . Type 2 diabetes mellitus with hyperglycemia ([place]) 06/09/2017  . Tinea corporis 06/09/2017    Past Surgical History:  Procedure Laterality Date  . BACK SURGERY    . CARDIAC CATHETERIZATION    . CHOLECYSTECTOMY    . COLONOSCOPY WITH PROPOFOL N/A 04/12/2019   Procedure:  COLONOSCOPY WITH PROPOFOL;  Surgeon: [name], MD;  Location: [facility] ENDOSCOPY;  Service: Gastroenterology;  Laterality: N/A;  . ESOPHAGOGASTRODUODENOSCOPY N/A 04/12/2019   Procedure: ESOPHAGOGASTRODUODENOSCOPY (EGD);  Surgeon: [name], MD;  Location: [facility] ENDOSCOPY;  Service: Gastroenterology;  Laterality: N/A;  .  ESOPHAGOGASTRODUODENOSCOPY (EGD) WITH PROPOFOL N/A 11/21/2019   Procedure: ESOPHAGOGASTRODUODENOSCOPY (EGD) WITH PROPOFOL;  Surgeon: [name], MD;  Location: [facility] ENDOSCOPY;  Service: Gastroenterology;  Laterality: N/A;  . FLEXIBLE BRONCHOSCOPY Bilateral 05/17/2019   Procedure: FLEXIBLE BRONCHOSCOPY;  Surgeon: [name], MD;  Location: [name] ORS;  Service: Pulmonary;  Laterality: Bilateral;  . left arm surgery    . nephrectomy Left   . PARATHYROIDECTOMY    . RIGHT HEART CATH N/A 03/29/2019   Procedure: RIGHT HEART CATH;  Surgeon: [name], MD;  Location: [place] CV LAB;  Service: Cardiovascular;  Laterality: N/A;    Prior to Admission medications   Medication Sig Start Date End Date Taking? Authorizing Provider  acetaminophen (TYLENOL) 325 MG tablet Take 2 tablets (650 mg total) by mouth every 4 (four) hours as needed for headache or mild pain. 03/05/19   Gouru, Illene Silver, MD  albuterol (VENTOLIN HFA) 108 (90 Base) MCG/ACT inhaler Inhale 2 puffs into the lungs every 6 (six) hours as needed for wheezing or shortness of breath. 03/05/19   Gouru, Illene Silver, MD  amLODipine (NORVASC) 10 MG tablet Take 1 tablet (10 mg total) by mouth daily. 08/01/19   [name], NP  benzonatate (TESSALON PERLES) 100 MG capsule Take 1 capsule (100 mg total) by mouth 3 (three) times daily as needed for cough. 12/10/19 12/09/20  Carrie Mew, MD  Blood Glucose Calibration (TRUE METRIX LEVEL 1) Low SOLN Use as directed 04/04/18   [name], NP  Blood Glucose Monitoring Suppl (TRUE METRIX AIR GLUCOSE METER) DEVI 1 Device by Does not apply route 2 (two) times daily. 04/13/18   [name], NP  ferrous gluconate (FERGON) 324 MG tablet Take 1 tablet (324 mg total) by mouth daily with breakfast. 08/01/19   [name], NP  furosemide (LASIX) 80 MG tablet Hold until outpatient PCP followup due to acute kidney injury. 11/21/19   Enzo Bi, MD  gabapentin (NEURONTIN) 100 MG capsule  Take 1 capsule (100 mg total) by mouth in the morning, at noon, and at bedtime. 11/26/19   [name], NP  gemfibrozil (LOPID) 600 MG tablet Take 1 tablet (600 mg total) by mouth 2 (two) times daily with a meal. 06/27/19   Boscia, Greer Ee, NP  glimepiride (AMARYL) 2 MG tablet Take 1 tablet (2 mg total) by mouth at bedtime. 08/02/19   Lavera Guise, MD  glucose blood (TRUE METRIX BLOOD GLUCOSE TEST) test strip Use as instructed twice daily diag E11.65 07/30/19   [name], NP  hydrALAZINE (APRESOLINE) 100 MG tablet Take 1 tablet (100 mg total) by mouth 3 (three) times daily. 01/06/20   [name], NP  hydrOXYzine (ATARAX) 10 MG/5ML syrup Take 5 mLs (10 mg total) by mouth 3 (three) times daily as needed for itching. 09/24/19   Sable Feil, PA-C  insulin glargine, 1 Unit Dial, (TOUJEO) 300 UNIT/ML Solostar Pen Inject 26 Units into the skin daily. 10/24/19   [name], NP  ipratropium-albuterol (DUONEB) 0.5-2.5 (3) MG/3ML SOLN Take 3 mLs by nebulization every 6 (six) hours as needed. 03/08/19   Kendell Bane, NP  isosorbide mononitrate (IMDUR) 30 MG 24 hr tablet Take  1 tablet (30 mg total) by mouth daily. 12/03/19   [name], MD  loratadine (CLARITIN) 10 MG tablet Take 1 tablet (10 mg total) by mouth daily as needed for allergies. 11/21/19   Enzo Bi, MD  metoprolol succinate (TOPROL-XL) 50 MG 24 hr tablet Take 1 tablet (50 mg total) by mouth at bedtime. Take with or immediately following a meal. 07/10/19   Lavera Guise, MD  naproxen (NAPROSYN) 500 MG tablet Take 1 tablet (500 mg total) by mouth 2 (two) times daily as needed. 11/21/19   Enzo Bi, MD  nitroGLYCERIN (NITROSTAT) 0.4 MG SL tablet Place 1 tablet (0.4 mg total) under the tongue every 5 (five) minutes x 3 doses as needed for chest pain. 05/24/19   Loletha Grayer, MD  NOVOFINE 32G X 6 MM MISC To use with [place] injections daily 12/13/18   [name], NP  omeprazole (PRILOSEC) 40 MG capsule 1 cap  daily Patient taking differently: Take 40 mg by mouth daily.  07/10/19   Lavera Guise, MD  potassium chloride SA (KLOR-CON) 20 MEQ tablet Take 1 tablet (20 mEq total) by mouth 2 (two) times daily. 11/26/19   [name], NP  predniSONE (DELTASONE) 50 MG tablet Take 1 tablet (50 mg total) by mouth daily with breakfast. 01/08/20   Lavonia Drafts, MD  rosuvastatin (CRESTOR) 10 MG tablet Take 1 tablet (10 mg total) by mouth daily. 08/23/19 11/21/19  [name], MD  sodium chloride (OCEAN) 0.65 % SOLN nasal spray Place 1 spray into both nostrils as needed for congestion. 06/18/19   Lavina Hamman, MD  tiotropium (SPIRIVA HANDIHALER) 18 MCG inhalation capsule Place 1 capsule (18 mcg total) into inhaler and inhale daily. 08/07/19 11/19/19  [name], NP  TRUEplus Lancets 33G MISC Use as directed twice daily diag E11.65 07/30/19   [name], NP  vitamin B-12 (CYANOCOBALAMIN) 1000 MCG tablet Take 1 tablet (1,000 mcg total) by mouth daily. 11/21/19   Enzo Bi, MD     Allergies Penicillins  Family History  Problem Relation Age of Onset  . Diabetes Mother   . Lung cancer Father   . Diabetes Sister   . Hypertension Sister   . Heart disease Sister   . Cancer Sister   . Bone cancer Brother     Social History Social History   Tobacco Use  . Smoking status: Former Smoker    Types: Cigarettes  . Smokeless tobacco: Former Systems developer    Types: Secondary school teacher  . Vaping Use: Never used  Substance Use Topics  . Alcohol use: No  . Drug use: No    Review of Systems  Constitutional: No fever/chills Eyes: No visual changes.  ENT: No sore throat. Cardiovascular: Denies chest pain. Respiratory: As above Gastrointestinal: No abdominal pain.   Genitourinary: Negative for dysuria. Musculoskeletal: Negative for back pain. Skin: Negative for rash. Neurological: Negative for headaches   ____________________________________________   PHYSICAL EXAM:  VITAL SIGNS: ED Triage  Vitals [01/08/20 0903]  Enc Vitals Group     BP (!) 142/59     Pulse Rate 59     Resp 18     Temp 97.9 F (36.6 C)     Temp Source Oral     SpO2 97 %     Weight (!) 114.8 kg (253 lb 1.4 oz)     Height 1.803 m (_0 )     Head Circumference      Peak Flow  Pain Score 0     Pain Loc      Pain Edu?      Excl. in [place]?     Constitutional: Alert and oriented. No acute distress.  Nose: No congestion/rhinnorhea. Mouth/Throat: Mucous membranes are moist.    Cardiovascular: Normal rate, regular rhythm. Grossly normal heart sounds.  Good peripheral circulation. Respiratory: Normal respiratory effort.  No retractions.  Scattered very mild wheezes Gastrointestinal: Soft and nontender. No distention.  No CVA tenderness.  Musculoskeletal: No lower extremity tenderness nor edema.  Warm and well perfused Neurologic:  Normal speech and language. No gross focal neurologic deficits are appreciated.  Skin:  Skin is warm, dry and intact. No rash noted. Psychiatric: Mood and affect are normal. Speech and behavior are normal.  ____________________________________________   LABS (all labs ordered are listed, but only abnormal results are displayed)  Labs Reviewed  CBC WITH DIFFERENTIAL/PLATELET - Abnormal; Notable for the following components:      Result Value   RBC 3.82 (*)    Hemoglobin 11.1 (*)    HCT 35.0 (*)    All other components within normal limits  COMPREHENSIVE METABOLIC PANEL - Abnormal; Notable for the following components:   Glucose, Bld 268 (*)    BUN 58 (*)    Creatinine, Ser 1.96 (*)    GFR calc non Af Amer 35 (*)    GFR calc Af Amer 40 (*)    All other components within normal limits   ____________________________________________  EKG  ED ECG REPORT I, Lavonia Drafts, the attending physician, personally viewed and interpreted this ECG.  Date: 01/08/2020  Rhythm: normal sinus rhythm QRS Axis: normal Intervals: normal ST/T Wave abnormalities:  normal Narrative Interpretation: no evidence of acute ischemia  ____________________________________________  RADIOLOGY  Chest x-ray reviewed by me, now infiltrate effusion or pneumothorax ____________________________________________   PROCEDURES  Procedure(s) performed: No  Procedures   Critical Care performed: No ____________________________________________   INITIAL IMPRESSION / ASSESSMENT AND PLAN / ED COURSE  Pertinent labs & imaging results that were available during my care of the patient were reviewed by me and considered in my medical decision making (see chart for details).  Patient overall well-appearing in no acute distress, vital signs are reassuring, normal respiratory rate, normal oxygenation, afebrile.  Scattered mild wheezes on exam.  Differential includes COPD exacerbation, bronchitis, pneumonia.  Review of records demonstrates the patient has had CT angiography in the past to evaluate for PE which has been negative recently.  Lab work today is unremarkable, normal white blood cell count, mild elevation in glucose BUN and creatinine are in line with prior levels  We will treat with DuoNeb here and reevaluate  Patient breathing more easily after DuoNeb, I have asked him to follow-up closely with pulmonology, no indication for admission at this time   ____________________________________________   FINAL CLINICAL IMPRESSION(S) / ED DIAGNOSES  Final diagnoses:  COPD exacerbation ([place])        Note:  This document was prepared using Dragon voice recognition software and may include unintentional dictation errors.   Lavonia Drafts, MD 01/08/20 1535

## 2020-01-08 NOTE — ED Triage Notes (Signed)
Pt in via EMS from home with c/o SOB this am. Pt has had intermittent episodes of this over the last week  126/79, CBG 244, HR 88, 97% RA, Lungs clear

## 2020-01-09 ENCOUNTER — Encounter: Payer: Self-pay | Admitting: Family Medicine

## 2020-01-09 ENCOUNTER — Observation Stay
Admission: EM | Admit: 2020-01-09 | Discharge: 2020-01-10 | Disposition: A | Discharge status: Home / Self Care | Payer: Medicare PPO | Attending: Internal Medicine | Admitting: Internal Medicine

## 2020-01-09 ENCOUNTER — Telehealth: Payer: Self-pay

## 2020-01-09 DIAGNOSIS — I5022 Chronic systolic (congestive) heart failure: Secondary | ICD-10-CM | POA: Diagnosis not present

## 2020-01-09 DIAGNOSIS — J449 Chronic obstructive pulmonary disease, unspecified: Secondary | ICD-10-CM

## 2020-01-09 DIAGNOSIS — G4733 Obstructive sleep apnea (adult) (pediatric): Secondary | ICD-10-CM | POA: Diagnosis present

## 2020-01-09 DIAGNOSIS — I10 Essential (primary) hypertension: Secondary | ICD-10-CM

## 2020-01-09 DIAGNOSIS — Z794 Long term (current) use of insulin: Secondary | ICD-10-CM

## 2020-01-09 DIAGNOSIS — E669 Obesity, unspecified: Secondary | ICD-10-CM | POA: Diagnosis not present

## 2020-01-09 DIAGNOSIS — K920 Hematemesis: Secondary | ICD-10-CM | POA: Diagnosis not present

## 2020-01-09 DIAGNOSIS — E1121 Type 2 diabetes mellitus with diabetic nephropathy: Secondary | ICD-10-CM | POA: Diagnosis not present

## 2020-01-09 DIAGNOSIS — E119 Type 2 diabetes mellitus without complications: Secondary | ICD-10-CM

## 2020-01-09 DIAGNOSIS — E1169 Type 2 diabetes mellitus with other specified complication: Secondary | ICD-10-CM | POA: Diagnosis not present

## 2020-01-09 DIAGNOSIS — E1159 Type 2 diabetes mellitus with other circulatory complications: Secondary | ICD-10-CM

## 2020-01-09 DIAGNOSIS — N1832 Chronic kidney disease, stage 3b: Secondary | ICD-10-CM

## 2020-01-09 DIAGNOSIS — N289 Disorder of kidney and ureter, unspecified: Secondary | ICD-10-CM

## 2020-01-09 LAB — BASIC METABOLIC PANEL
Anion gap: 13 (ref 5–15)
Anion gap: 13 (ref 5–15)
BUN: 54 mg/dL — ABNORMAL HIGH (ref 8–23)
BUN: 53 mg/dL — ABNORMAL HIGH (ref 8–23)
CO2: 24 mmol/L (ref 22–32)
CO2: 23 mmol/L (ref 22–32)
Calcium: 9.1 mg/dL (ref 8.9–10.3)
Calcium: 8.8 mg/dL — ABNORMAL LOW (ref 8.9–10.3)
Chloride: 107 mmol/L (ref 98–111)
Chloride: 107 mmol/L (ref 98–111)
Creatinine, Ser: 2.28 mg/dL — ABNORMAL HIGH (ref 0.61–1.24)
Creatinine, Ser: 2.09 mg/dL — ABNORMAL HIGH (ref 0.61–1.24)
GFR calc Af Amer: 33 mL/min — ABNORMAL LOW (ref >60)
GFR calc Af Amer: 37 mL/min — ABNORMAL LOW (ref >60)
GFR calc non Af Amer: 29 mL/min — ABNORMAL LOW (ref >60)
GFR calc non Af Amer: 32 mL/min — ABNORMAL LOW (ref >60)
Glucose, Bld: 166 mg/dL — ABNORMAL HIGH (ref 70–99)
Glucose, Bld: 132 mg/dL — ABNORMAL HIGH (ref 70–99)
Potassium: 3.7 mmol/L (ref 3.5–5.1)
Potassium: 3.6 mmol/L (ref 3.5–5.1)
Sodium: 144 mmol/L (ref 135–145)
Sodium: 143 mmol/L (ref 135–145)

## 2020-01-09 LAB — GLUCOSE, CAPILLARY
Glucose-Capillary: 125 mg/dL — ABNORMAL HIGH (ref 70–99)
Glucose-Capillary: 105 mg/dL — ABNORMAL HIGH (ref 70–99)
Glucose-Capillary: 97 mg/dL (ref 70–99)
Glucose-Capillary: 152 mg/dL — ABNORMAL HIGH (ref 70–99)
Glucose-Capillary: 183 mg/dL — ABNORMAL HIGH (ref 70–99)

## 2020-01-09 LAB — CBC WITH DIFFERENTIAL/PLATELET
Abs Immature Granulocytes: 0.02 10*3/uL (ref 0.00–0.07)
Basophils Absolute: 0.1 10*3/uL (ref 0.0–0.1)
Basophils Relative: 1 %
Eosinophils Absolute: 0.2 10*3/uL (ref 0.0–0.5)
Eosinophils Relative: 2 %
HCT: 37.8 % — ABNORMAL LOW (ref 39.0–52.0)
Hemoglobin: 11.8 g/dL — ABNORMAL LOW (ref 13.0–17.0)
Immature Granulocytes: 0 %
Lymphocytes Relative: 22 %
Lymphs Abs: 1.7 10*3/uL (ref 0.7–4.0)
MCH: 28.4 pg (ref 26.0–34.0)
MCHC: 31.2 g/dL (ref 30.0–36.0)
MCV: 91.1 fL (ref 80.0–100.0)
Monocytes Absolute: 0.6 10*3/uL (ref 0.1–1.0)
Monocytes Relative: 7 %
Neutro Abs: 5.3 10*3/uL (ref 1.7–7.7)
Neutrophils Relative %: 68 %
Platelets: 224 10*3/uL (ref 150–400)
RBC: 4.15 MIL/uL — ABNORMAL LOW (ref 4.22–5.81)
RDW: 13.9 % (ref 11.5–15.5)
WBC: 7.8 10*3/uL (ref 4.0–10.5)
nRBC: 0 % (ref 0.0–0.2)

## 2020-01-09 LAB — HEMOGLOBIN AND HEMATOCRIT, BLOOD
HCT: 34.7 % — ABNORMAL LOW (ref 39.0–52.0)
Hemoglobin: 11.2 g/dL — ABNORMAL LOW (ref 13.0–17.0)

## 2020-01-09 LAB — TYPE AND SCREEN
ABO/RH(D): A POS
Antibody Screen: NEGATIVE

## 2020-01-09 LAB — SARS CORONAVIRUS 2 BY RT PCR (HOSPITAL ORDER, PERFORMED IN ~~LOC~~ HOSPITAL LAB): SARS Coronavirus 2: NEGATIVE

## 2020-01-09 MED ORDER — SODIUM CHLORIDE 0.9 % IV SOLN: INTRAVENOUS | Status: AC

## 2020-01-09 MED ORDER — ACETAMINOPHEN 325 MG PO TABS: 650.0000 mg | ORAL_TABLET | Freq: Four times a day (QID) | ORAL | Status: DC | PRN

## 2020-01-09 MED ORDER — IPRATROPIUM-ALBUTEROL 0.5-2.5 (3) MG/3ML IN SOLN: 3.0000 mL | Freq: Four times a day (QID) | RESPIRATORY_TRACT | Status: DC | PRN

## 2020-01-09 MED ORDER — ACETAMINOPHEN 650 MG RE SUPP: 650.0000 mg | Freq: Four times a day (QID) | RECTAL | Status: DC | PRN

## 2020-01-09 MED ORDER — AMLODIPINE BESYLATE 10 MG PO TABS
10.0000 mg | ORAL_TABLET | Freq: Every day | ORAL | Status: DC
  Administered 2020-01-09 – 2020-01-10 (×2): 10 mg via ORAL
  Filled 2020-01-09: qty 2
  Filled 2020-01-09: qty 1

## 2020-01-09 MED ORDER — INSULIN ASPART 100 UNIT/ML ~~LOC~~ SOLN
0.0000 [IU] | SUBCUTANEOUS | Status: DC
  Administered 2020-01-09: 2 [IU] via SUBCUTANEOUS
  Administered 2020-01-09: 1 [IU] via SUBCUTANEOUS
  Administered 2020-01-09: 2 [IU] via SUBCUTANEOUS
  Administered 2020-01-10 (×2): 1 [IU] via SUBCUTANEOUS
  Filled 2020-01-09 (×5): qty 1

## 2020-01-09 MED ORDER — HYDRALAZINE HCL 25 MG PO TABS
25.0000 mg | ORAL_TABLET | Freq: Four times a day (QID) | ORAL | Status: DC | PRN
  Administered 2020-01-09: 25 mg via ORAL
  Filled 2020-01-09: qty 1

## 2020-01-09 MED ORDER — ONDANSETRON HCL 4 MG/2ML IJ SOLN: 4.0000 mg | Freq: Four times a day (QID) | INTRAMUSCULAR | Status: DC | PRN

## 2020-01-09 MED ORDER — PANTOPRAZOLE SODIUM 40 MG IV SOLR
40.0000 mg | Freq: Once | INTRAVENOUS | Status: AC
  Administered 2020-01-09: 40 mg via INTRAVENOUS
  Filled 2020-01-09: qty 40

## 2020-01-09 MED ORDER — SODIUM CHLORIDE 0.9% FLUSH
3.0000 mL | Freq: Two times a day (BID) | INTRAVENOUS | Status: DC
  Administered 2020-01-09 – 2020-01-10 (×3): 3 mL via INTRAVENOUS

## 2020-01-09 MED ORDER — PANTOPRAZOLE SODIUM 40 MG IV SOLR
40.0000 mg | Freq: Two times a day (BID) | INTRAVENOUS | Status: DC
  Administered 2020-01-09 – 2020-01-10 (×3): 40 mg via INTRAVENOUS
  Filled 2020-01-09 (×3): qty 40

## 2020-01-09 NOTE — Telephone Encounter (Signed)
Called lmom informing patient of appointment on 01/13/2020. klh

## 2020-01-09 NOTE — ED Notes (Signed)
Pt taken off monitoring equipment to use bathroom 

## 2020-01-09 NOTE — ED Notes (Signed)
Pt given meal tray.

## 2020-01-09 NOTE — ED Notes (Signed)
Pt unhooked to use toilet

## 2020-01-09 NOTE — H&P (Signed)
History and Physical    Larry Callahan NUU:[id number] DOB: 06-02-1956 DOA: 01/09/2020  PCP: [name], NP   Patient coming from: Home   Chief Complaint: Vomited blood   HPI: Larry Callahan is a 64 y.o. male with medical history significant for hypertension, type 2 diabetes mellitus, chronic systolic CHF, chronic kidney disease stage IIIb, and COPD, now presenting to the emergency department for evaluation of hematemesis.  Patient was seen in the ED yesterday with shortness of breath and reported scant hemoptysis at that time.  He is breathing improved significantly with a breathing treatment in the ED yesterday and he went home, but now returns reporting a large volume of hematemesis.  Patient states that he has not been short of breath or coughing much since the ED visit yesterday, does not have any abdominal pain, and has not noticed any melena or hematochezia but had one episode of vomiting with bright red blood.  Patient reports that it looked like approximately 1 cup.  He denies any fevers or chills, denies chest pain, and has not noticed any leg swelling or tenderness.  ED Course: Upon arrival to the ED, patient is found to be afebrile, saturating well on room air, and with stable blood pressure.  EKG features a sinus rhythm and chest x-rays negative for acute cardiopulmonary disease.  Chemistry panel is notable for creatinine of 2.28, up from 1.96 the day prior.  CBC with slight normocytic anemia.  Type and screen was performed in the emergency department, patient was given IV Protonix, and hospitalist consulted for admission.  Review of Systems:  All other systems reviewed and apart from HPI, are negative.  Past Medical History:  Diagnosis Date  . Allergy   . Coronary artery disease   . Diabetes mellitus without complication ([place])   . Diastolic CHF ([place])   . GERD (gastroesophageal reflux disease)   . Hyperlipidemia   . Hypertension   . Renal insufficiency     . Sleep apnea     Past Surgical History:  Procedure Laterality Date  . BACK SURGERY    . CARDIAC CATHETERIZATION    . CHOLECYSTECTOMY    . COLONOSCOPY WITH PROPOFOL N/A 04/12/2019   Procedure: COLONOSCOPY WITH PROPOFOL;  Surgeon: [name], MD;  Location: [facility] ENDOSCOPY;  Service: Gastroenterology;  Laterality: N/A;  . ESOPHAGOGASTRODUODENOSCOPY N/A 04/12/2019   Procedure: ESOPHAGOGASTRODUODENOSCOPY (EGD);  Surgeon: [name], MD;  Location: [facility] ENDOSCOPY;  Service: Gastroenterology;  Laterality: N/A;  . ESOPHAGOGASTRODUODENOSCOPY (EGD) WITH PROPOFOL N/A 11/21/2019   Procedure: ESOPHAGOGASTRODUODENOSCOPY (EGD) WITH PROPOFOL;  Surgeon: [name], MD;  Location: [facility] ENDOSCOPY;  Service: Gastroenterology;  Laterality: N/A;  . FLEXIBLE BRONCHOSCOPY Bilateral 05/17/2019   Procedure: FLEXIBLE BRONCHOSCOPY;  Surgeon: [name], MD;  Location: [name] ORS;  Service: Pulmonary;  Laterality: Bilateral;  . left arm surgery    . nephrectomy Left   . PARATHYROIDECTOMY    . RIGHT HEART CATH N/A 03/29/2019   Procedure: RIGHT HEART CATH;  Surgeon: [name], MD;  Location: [facility];  Service: Cardiovascular;  Laterality: N/A;    Social History:   reports that he has quit smoking. His smoking use included cigarettes. He has quit using smokeless tobacco.  His smokeless tobacco use included chew. He reports that he does not drink alcohol and does not use drugs.  Allergies  Allergen Reactions  . Penicillins Rash    Family History  Problem Relation Age of Onset  . Diabetes Mother   .  Lung cancer Father   . Diabetes Sister   . Hypertension Sister   . Heart disease Sister   . Cancer Sister   . Bone cancer Brother      Prior to Admission medications   Medication Sig Start Date End Date Taking? Authorizing Provider  acetaminophen (TYLENOL) 325 MG tablet Take 2 tablets (650 mg total) by mouth every 4 (four) hours as needed for headache or mild  pain. 03/05/19   Gouru, Illene Silver, MD  albuterol (VENTOLIN HFA) 108 (90 Base) MCG/ACT inhaler Inhale 2 puffs into the lungs every 6 (six) hours as needed for wheezing or shortness of breath. 03/05/19   Gouru, Illene Silver, MD  amLODipine (NORVASC) 10 MG tablet Take 1 tablet (10 mg total) by mouth daily. 08/01/19   [name], NP  benzonatate (TESSALON PERLES) 100 MG capsule Take 1 capsule (100 mg total) by mouth 3 (three) times daily as needed for cough. 12/10/19 12/09/20  Carrie Mew, MD  Blood Glucose Calibration (TRUE METRIX LEVEL 1) Low SOLN Use as directed 04/04/18   [name], NP  Blood Glucose Monitoring Suppl (TRUE METRIX AIR GLUCOSE METER) DEVI 1 Device by Does not apply route 2 (two) times daily. 04/13/18   [name], NP  ferrous gluconate (FERGON) 324 MG tablet Take 1 tablet (324 mg total) by mouth daily with breakfast. 08/01/19   [name], NP  furosemide (LASIX) 80 MG tablet Hold until outpatient PCP followup due to acute kidney injury. 11/21/19   Enzo Bi, MD  gabapentin (NEURONTIN) 100 MG capsule Take 1 capsule (100 mg total) by mouth in the morning, at noon, and at bedtime. 11/26/19   [name], NP  gemfibrozil (LOPID) 600 MG tablet Take 1 tablet (600 mg total) by mouth 2 (two) times daily with a meal. 06/27/19   Boscia, Greer Ee, NP  glimepiride (AMARYL) 2 MG tablet Take 1 tablet (2 mg total) by mouth at bedtime. 08/02/19   Lavera Guise, MD  glucose blood (TRUE METRIX BLOOD GLUCOSE TEST) test strip Use as instructed twice daily diag E11.65 07/30/19   [name], NP  hydrALAZINE (APRESOLINE) 100 MG tablet Take 1 tablet (100 mg total) by mouth 3 (three) times daily. 01/06/20   [name], NP  hydrOXYzine (ATARAX) 10 MG/5ML syrup Take 5 mLs (10 mg total) by mouth 3 (three) times daily as needed for itching. 09/24/19   Sable Feil, PA-C  insulin glargine, 1 Unit Dial, (TOUJEO) 300 UNIT/ML Solostar Pen Inject 26 Units into the skin daily. 10/24/19    [name], NP  ipratropium-albuterol (DUONEB) 0.5-2.5 (3) MG/3ML SOLN Take 3 mLs by nebulization every 6 (six) hours as needed. 03/08/19   Kendell Bane, NP  isosorbide mononitrate (IMDUR) 30 MG 24 hr tablet Take 1 tablet (30 mg total) by mouth daily. 12/03/19   [name], MD  loratadine (CLARITIN) 10 MG tablet Take 1 tablet (10 mg total) by mouth daily as needed for allergies. 11/21/19   Enzo Bi, MD  metoprolol succinate (TOPROL-XL) 50 MG 24 hr tablet Take 1 tablet (50 mg total) by mouth at bedtime. Take with or immediately following a meal. 07/10/19   Lavera Guise, MD  naproxen (NAPROSYN) 500 MG tablet Take 1 tablet (500 mg total) by mouth 2 (two) times daily as needed. 11/21/19   Enzo Bi, MD  nitroGLYCERIN (NITROSTAT) 0.4 MG SL tablet Place 1 tablet (0.4 mg total) under the tongue every 5 (five) minutes  x 3 doses as needed for chest pain. 05/24/19   Loletha Grayer, MD  NOVOFINE 32G X 6 MM MISC To use with [place] injections daily 12/13/18   [name], NP  omeprazole (PRILOSEC) 40 MG capsule 1 cap daily Patient taking differently: Take 40 mg by mouth daily.  07/10/19   Lavera Guise, MD  potassium chloride SA (KLOR-CON) 20 MEQ tablet Take 1 tablet (20 mEq total) by mouth 2 (two) times daily. 11/26/19   [name], NP  predniSONE (DELTASONE) 50 MG tablet Take 1 tablet (50 mg total) by mouth daily with breakfast. 01/08/20   Lavonia Drafts, MD  rosuvastatin (CRESTOR) 10 MG tablet Take 1 tablet (10 mg total) by mouth daily. 08/23/19 11/21/19  [name], MD  sodium chloride (OCEAN) 0.65 % SOLN nasal spray Place 1 spray into both nostrils as needed for congestion. 06/18/19   Lavina Hamman, MD  tiotropium (SPIRIVA HANDIHALER) 18 MCG inhalation capsule Place 1 capsule (18 mcg total) into inhaler and inhale daily. 08/07/19 11/19/19  [name], NP  TRUEplus Lancets 33G MISC Use as directed twice daily diag E11.65 07/30/19   [name], NP  vitamin B-12  (CYANOCOBALAMIN) 1000 MCG tablet Take 1 tablet (1,000 mcg total) by mouth daily. 11/21/19   Enzo Bi, MD    Physical Exam: Vitals:   01/09/20 0100 01/09/20 0130 01/09/20 0200 01/09/20 0230  BP: (!) 177/75 (!) 172/73 (!) 163/70 (!) 162/73  Pulse: 70 71  72  Resp:    16  Temp:      TempSrc:      SpO2: 99% 97% 96% 97%  Weight:      Height:        Constitutional: NAD, calm  Eyes: PERTLA, lids and conjunctivae normal ENMT: Mucous membranes are moist. Posterior pharynx clear of any exudate or lesions.   Neck: normal, supple, no masses, no thyromegaly Respiratory:  no wheezing, no crackles. No accessory muscle use.  Cardiovascular: S1 & S2 heard, regular rate and rhythm. Trace LE edema.   Abdomen: No distension, no tenderness, soft. Bowel sounds active.  Musculoskeletal: no clubbing / cyanosis. No joint deformity upper and lower extremities.   Skin: no significant rashes, lesions, ulcers. Warm, dry, well-perfused. Neurologic: CN 2-12 grossly intact. Sensation intact. Moving all extremities.  Psychiatric: Alert and oriented to person, place, and situation. Calm and cooperative.    Labs and Imaging on Admission: I have personally reviewed following labs and imaging studies  CBC: Recent Labs  Lab 01/08/20 0910 01/09/20 0038  WBC 6.4 7.8  NEUTROABS 4.6 5.3  HGB 11.1* 11.8*  HCT 35.0* 37.8*  MCV 91.6 91.1  PLT 201 629   Basic Metabolic Panel: Recent Labs  Lab 01/08/20 0910 01/09/20 0038  NA 138 144  K 4.3 3.7  CL 103 107  CO2 23 24  GLUCOSE 268* 166*  BUN 58* 54*  CREATININE 1.96* 2.28*  CALCIUM 9.0 9.1   GFR: Estimated Creatinine Clearance: 41.1 mL/min (A) (by C-G formula based on SCr of 2.28 mg/dL (H)). Liver Function Tests: Recent Labs  Lab 01/08/20 0910  AST 19  ALT 15  ALKPHOS 73  BILITOT 0.6  PROT 7.3  ALBUMIN 3.7   No results for input(s): LIPASE, AMYLASE in the last 168 hours. No results for input(s): AMMONIA in the last 168 hours. Coagulation  Profile: No results for input(s): INR, PROTIME in the last 168 hours. Cardiac Enzymes: No results for input(s): CKTOTAL, CKMB, CKMBINDEX, TROPONINI in  the last 168 hours. BNP (last 3 results) No results for input(s): PROBNP in the last 8760 hours. HbA1C: No results for input(s): HGBA1C in the last 72 hours. CBG: No results for input(s): GLUCAP in the last 168 hours. Lipid Profile: No results for input(s): CHOL, HDL, LDLCALC, TRIG, CHOLHDL, LDLDIRECT in the last 72 hours. Thyroid Function Tests: No results for input(s): TSH, T4TOTAL, FREET4, T3FREE, THYROIDAB in the last 72 hours. Anemia Panel: No results for input(s): VITAMINB12, FOLATE, FERRITIN, TIBC, IRON, RETICCTPCT in the last 72 hours. Urine analysis:    Component Value Date/Time   COLORURINE PINK (A) 12/10/2019 1230   APPEARANCEUR CLOUDY (A) 12/10/2019 1230   APPEARANCEUR Cloudy (A) 11/23/2018 1103   LABSPEC 1.009 12/10/2019 1230   LABSPEC 1.005 03/06/2013 0929   PHURINE 5.0 12/10/2019 1230   GLUCOSEU NEGATIVE 12/10/2019 1230   GLUCOSEU Negative 03/06/2013 0929   HGBUR LARGE (A) 12/10/2019 1230   BILIRUBINUR NEGATIVE 12/10/2019 1230   BILIRUBINUR negative 02/20/2019 1404   BILIRUBINUR Negative 11/23/2018 1103   BILIRUBINUR Negative 03/06/2013 0929   KETONESUR NEGATIVE 12/10/2019 1230   PROTEINUR 100 (A) 12/10/2019 1230   UROBILINOGEN 0.2 02/20/2019 1404   NITRITE NEGATIVE 12/10/2019 1230   LEUKOCYTESUR TRACE (A) 12/10/2019 1230   LEUKOCYTESUR Negative 03/06/2013 0929   Sepsis Labs: _0 (procalcitonin:4,lacticidven:4) ) Recent Results (from the past 240 hour(s))  SARS Coronavirus 2 by RT PCR (hospital order, performed in [place] hospital lab) Nasopharyngeal Nasopharyngeal Swab     Status: None   Collection Time: 01/09/20  1:20 AM   Specimen: Nasopharyngeal Swab  Result Value Ref Range Status   SARS Coronavirus 2 NEGATIVE NEGATIVE Final    Comment: (NOTE) SARS-CoV-2 target nucleic acids are NOT  DETECTED.  The SARS-CoV-2 RNA is generally detectable in upper and lower respiratory specimens during the acute phase of infection. The lowest concentration of SARS-CoV-2 viral copies this assay can detect is 250 copies / mL. A negative result does not preclude SARS-CoV-2 infection and should not be used as the sole basis for treatment or other patient management decisions.  A negative result may occur with improper specimen collection / handling, submission of specimen other than nasopharyngeal swab, presence of viral mutation(s) within the areas targeted by this assay, and inadequate number of viral copies (<250 copies / mL). A negative result must be combined with clinical observations, patient history, and epidemiological information.  Fact Sheet for Patients:   StrictlyIdeas.no  Fact Sheet for Healthcare Providers: BankingDealers.co.za  This test is not yet approved or  cleared by the [place] FDA and has been authorized for detection and/or diagnosis of SARS-CoV-2 by FDA under an Emergency Use Authorization (EUA).  This EUA will remain in effect (meaning this test can be used) for the duration of the COVID-19 declaration under Section 564(b)(1) of the Act, 21 U.S.C. section 360bbb-3(b)(1), unless the authorization is terminated or revoked sooner.  Performed at [facility], [address] [place], [place] [address]      Radiological Exams on Admission: DG Chest 2 View  Result Date: 01/08/2020 CLINICAL DATA:  Shortness of breath, cough EXAM: CHEST - 2 VIEW COMPARISON:  12/21/2019 FINDINGS: Heart and mediastinal contours are within normal limits. No focal opacities or effusions. No acute bony abnormality. IMPRESSION: No active cardiopulmonary disease. Electronically Signed   By: Rolm Baptise M.D.   On: 01/08/2020 09:50    EKG: Independently reviewed. Sinus rhythm.   Assessment/Plan   1. Hematemesis  -  Patient reports vomiting ~1 cup  of red blood shortly prior to arrival, does not have abdominal pain, and denies melena or hematochezia  - There has not been any vomiting while in ED, H&H is stable, and he is hemodynamically stable  - EGD from June 2021 with normal esophagus, stomach, and duodenum through 2nd portion  - Continue IV PPI, keep NPO for now, type & screen, trend H&H    2. COPD  - No cough or wheezing on admission  - Continue as-needed breathing treatments   3. Chronic systolic CHF  - EF was 97-67% in November 2020  - Appears compensated   - Hold diuretics initially and start gentle IVF hydration given vomiting and increased creatinine, monitor volume status   4. Hypertension  - BP slightly elevated in ED  - With concern for possible acute upper GIB, will hold scheduled antihypertensives and treat only as needed for now    5. Type II DM  - A1c was 7.1% in June 2021  - Pharmacy medication-reconciliation is pending, will use low-intensity SSI for now    6. CKD IIIb  - SCr is 2.28 on admission, slightly up from his apparent baseline  - He reports vomiting pta and will be given gentle IVF hydration while NPO, medications will be renally-dosed      DVT prophylaxis: SCD  Code Status: Full  Family Communication: Discussed with patient  Disposition Plan:  Patient is from: Home  Anticipated d/c is to: Home  Anticipated d/c date is: 01/10/20 Patient currently: Reports hematemesis and will be observed with trending of H&H  Consults called: none  Admission status: Observation     Vianne Bulls, MD [facility]  01/09/2020, 3:16 AM

## 2020-01-09 NOTE — ED Notes (Signed)
Admitting MD at bedside.

## 2020-01-09 NOTE — ED Notes (Signed)
Notified Cori RT pt is requesting CPAP to sleep. States she will pass on in report now.

## 2020-01-09 NOTE — ED Notes (Signed)
Pt ambulatory to toilet with steady gait noted.  

## 2020-01-09 NOTE — Progress Notes (Addendum)
PROGRESS NOTE    Larry Callahan  TMH:[id number] DOB: 03/08/1956 DOA: 01/09/2020 PCP: [name], NP    Assessment & Plan:   Principal Problem:   Hematemesis Active Problems:   Obstructive sleep apnea, adult   COPD (chronic obstructive pulmonary disease) ([facility])   Essential hypertension   Chronic systolic CHF (congestive heart failure) ([facility])   Chronic kidney disease, stage 3b   Type 2 diabetes mellitus with stage 3b chronic kidney disease, with long-term current use of insulin ([facility])   Hematemesis: H&H are stable. EGD from June 2021 with normal esophagus, stomach, and duodenum through 2nd portion. Continue on PPI   COPD: w/o exacerbation. Continue on bronchodilators   Chronic systolic CHF: echo showed EF 45-50% in November 2020. Without exacerbation  Hypertension: will restart home dose of amlodipine. Hydralazine prn  DM2: HbA1c was 7.1% in June 2021, fairly well controlled. Continue on SSI w/ accuchecks   CKD IIIb: Cr is trending down from day prior. Avoid nephrotoxic meds. Will continue to monitor   Obesity: BMI 35.3. Would benefit from weight loss    DVT prophylaxis: SCDs Code Status: full  Family Communication:  Disposition Plan: depends on PT/OT recs   Consultants:      Procedures:    Antimicrobials:   Subjective: Pt c/o fatigue.   Objective: Vitals:   01/09/20 0629 01/09/20 0630 01/09/20 0700 01/09/20 0800  BP:  (!) 153/85 (!) 180/83   Pulse: 67  57 59  Resp: 18   15  Temp:      TempSrc:      SpO2: 95%  97% 97%  Weight:      Height:        Intake/Output Summary (Last 24 hours) at 01/09/2020 0857 Last data filed at 01/09/2020 0558 Gross per 24 hour  Intake --  Output 550 ml  Net -550 ml   Filed Weights   01/08/20 1734  Weight: (!) 114.8 kg    Examination:  General exam: Appears calm and comfortable  Respiratory system: Clear to auscultation. Respiratory effort normal. No rales Cardiovascular system: S1 & S2+. No  rubs, gallops or clicks.  Gastrointestinal system: Abdomen is obese, soft and nontender.  Normal bowel sounds heard. Central nervous system: Alert and oriented. Moves all 4 extremities  Psychiatry: Judgement and insight appear normal. Mood & affect appropriate.     Data Reviewed: I have personally reviewed following labs and imaging studies  CBC: Recent Labs  Lab 01/08/20 0910 01/09/20 0038 01/09/20 0356  WBC 6.4 7.8  --   NEUTROABS 4.6 5.3  --   HGB 11.1* 11.8* 11.2*  HCT 35.0* 37.8* 34.7*  MCV 91.6 91.1  --   PLT 201 224  --    Basic Metabolic Panel: Recent Labs  Lab 01/08/20 0910 01/09/20 0038 01/09/20 0356  NA 138 144 143  K 4.3 3.7 3.6  CL 103 107 107  CO2 23 24 23   GLUCOSE 268* 166* 132*  BUN 58* 54* 53*  CREATININE 1.96* 2.28* 2.09*  CALCIUM 9.0 9.1 8.8*   GFR: Estimated Creatinine Clearance: 44.8 mL/min (A) (by C-G formula based on SCr of 2.09 mg/dL (H)). Liver Function Tests: Recent Labs  Lab 01/08/20 0910  AST 19  ALT 15  ALKPHOS 73  BILITOT 0.6  PROT 7.3  ALBUMIN 3.7   No results for input(s): LIPASE, AMYLASE in the last 168 hours. No results for input(s): AMMONIA in the last 168 hours. Coagulation Profile: No results for input(s): INR, PROTIME in the last  168 hours. Cardiac Enzymes: No results for input(s): CKTOTAL, CKMB, CKMBINDEX, TROPONINI in the last 168 hours. BNP (last 3 results) No results for input(s): PROBNP in the last 8760 hours. HbA1C: No results for input(s): HGBA1C in the last 72 hours. CBG: Recent Labs  Lab 01/09/20 0353 01/09/20 0736  GLUCAP 125* 105*   Lipid Profile: No results for input(s): CHOL, HDL, LDLCALC, TRIG, CHOLHDL, LDLDIRECT in the last 72 hours. Thyroid Function Tests: No results for input(s): TSH, T4TOTAL, FREET4, T3FREE, THYROIDAB in the last 72 hours. Anemia Panel: No results for input(s): VITAMINB12, FOLATE, FERRITIN, TIBC, IRON, RETICCTPCT in the last 72 hours. Sepsis Labs: No results for input(s):  PROCALCITON, LATICACIDVEN in the last 168 hours.  Recent Results (from the past 240 hour(s))  SARS Coronavirus 2 by RT PCR (hospital order, performed in [facility] hospital lab) Nasopharyngeal Nasopharyngeal Swab     Status: None   Collection Time: 01/09/20  1:20 AM   Specimen: Nasopharyngeal Swab  Result Value Ref Range Status   SARS Coronavirus 2 NEGATIVE NEGATIVE Final    Comment: (NOTE) SARS-CoV-2 target nucleic acids are NOT DETECTED.  The SARS-CoV-2 RNA is generally detectable in upper and lower respiratory specimens during the acute phase of infection. The lowest concentration of SARS-CoV-2 viral copies this assay can detect is 250 copies / mL. A negative result does not preclude SARS-CoV-2 infection and should not be used as the sole basis for treatment or other patient management decisions.  A negative result may occur with improper specimen collection / handling, submission of specimen other than nasopharyngeal swab, presence of viral mutation(s) within the areas targeted by this assay, and inadequate number of viral copies (<250 copies / mL). A negative result must be combined with clinical observations, patient history, and epidemiological information.  Fact Sheet for Patients:   StrictlyIdeas.no  Fact Sheet for Healthcare Providers: BankingDealers.co.za  This test is not yet approved or  cleared by the [place] FDA and has been authorized for detection and/or diagnosis of SARS-CoV-2 by FDA under an Emergency Use Authorization (EUA).  This EUA will remain in effect (meaning this test can be used) for the duration of the COVID-19 declaration under Section 564(b)(1) of the Act, 21 U.S.C. section 360bbb-3(b)(1), unless the authorization is terminated or revoked sooner.  Performed at [facility], [address] [place], [place] [address]          Radiology Studies: DG Chest 2 View  Result Date:  01/08/2020 CLINICAL DATA:  Shortness of breath, cough EXAM: CHEST - 2 VIEW COMPARISON:  12/21/2019 FINDINGS: Heart and mediastinal contours are within normal limits. No focal opacities or effusions. No acute bony abnormality. IMPRESSION: No active cardiopulmonary disease. Electronically Signed   By: Rolm Baptise M.D.   On: 01/08/2020 09:50        Scheduled Meds: . insulin aspart  0-9 Units Subcutaneous Q4H  . pantoprazole (PROTONIX) IV  40 mg Intravenous Q12H  . sodium chloride flush  3 mL Intravenous Q12H   Continuous Infusions: . sodium chloride       LOS: 0 days    Time spent: 34 mins     Wyvonnia Dusky, MD [facility] Pager [phone]  If 7PM-7AM, please contact night-coverage [email or web address] 01/09/2020, 8:57 AM

## 2020-01-09 NOTE — ED Notes (Signed)
Pt given drink and apple sauce

## 2020-01-09 NOTE — ED Provider Notes (Signed)
[facility] Emergency Department Provider Note   ____________________________________________   First MD Initiated Contact with Patient 01/09/20 0012     (approximate)  I have reviewed the triage vital signs and the nursing notes.   HISTORY  Chief Complaint Hemoptysis   HPI Larry Callahan is a 64 y.o. male who was seen earlier today for hemoptysis.  He reports he got home and vomited up a large amount of blood.  Since then he has been spitting up small amounts of blood and shows me a paper towel covered with lima bean sized blood spots.  Patient reports he is not coughing up blood he is actually vomiting the blood up now.  He says he has had this before and had an endoscopy done but they could not find anything.  He is not currently short of breath or nauseated or having any other complaints except for the spitting up of blood.         Past Medical History:  Diagnosis Date  . Allergy   . Coronary artery disease   . Diabetes mellitus without complication ([place])   . Diastolic CHF ([place])   . GERD (gastroesophageal reflux disease)   . Hyperlipidemia   . Hypertension   . Renal insufficiency   . Sleep apnea     Patient Active Problem List   Diagnosis Date Noted  . Hospital discharge follow-up 11/26/2019  . Cellulitis of left hand 09/26/2019  . Left-sided weakness 08/08/2019  . BMI 35.0-35.9,adult 08/08/2019  . Acute right hip pain 06/17/2019  . Inability to ambulate due to hip 06/17/2019  . CKD stage 3 due to type 2 diabetes mellitus ([place])   . Hypervolemia   . Acute on chronic diastolic CHF (congestive heart failure) ([place])   . Lobar pneumonia ([place])   . Type 2 diabetes mellitus with stage 3b chronic kidney disease, with long-term current use of insulin ([place])   . Hypokalemia   . Hypomagnesemia   . Full code status 05/20/2019  . Pleuritic chest pain   . CAP (community acquired pneumonia) 05/19/2019  . AKI (acute kidney injury) ([place])  05/09/2019  . Hemoptysis 04/09/2019  . Shortness of breath 03/26/2019  . Uncontrolled type 2 diabetes mellitus with insulin therapy ([place]) 03/12/2019  . Acute on chronic heart failure with preserved ejection fraction (HFpEF) ([place])   . Acute kidney injury superimposed on chronic kidney disease ([place])   . CHF (congestive heart failure) ([place]) 03/01/2019  . Diastolic dysfunction 32/54/9826  . Iron deficiency anemia 12/31/2018  . Atopic dermatitis 11/24/2018  . Screening for colon cancer 11/06/2017  . Uncontrolled type 2 diabetes mellitus with hyperglycemia ([place]) 11/06/2017  . Dermatitis, unspecified 06/09/2017  . Atherosclerotic heart disease of native coronary artery without angina pectoris 06/09/2017  . 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 Obstructive sleep apnea, adult 06/09/2017  . Morbid (severe) obesity due to excess calories ([place]) 06/09/2017  . Cigarette nicotine dependence with nicotine-induced disorder 06/09/2017  . Diabetic polyneuropathy associated with type 2 diabetes mellitus ([place]) 06/09/2017  . Allergic rhinitis due to pollen 06/09/2017  . Mixed hyperlipidemia 06/09/2017  . COPD with acute exacerbation ([place]) 06/09/2017  . Dyspnea on exertion 06/09/2017  . Wheezing 06/09/2017  . Dysuria 06/09/2017  . Snoring 06/09/2017  . Essential hypertension 06/09/2017  . Personal history of other malignant neoplasm of kidney 06/09/2017  . Pain in right hip 06/09/2017  . Impacted cerumen, bilateral 06/09/2017  . Secondary hyperparathyroidism, not elsewhere classified ([place])  06/09/2017  . Type 2 diabetes mellitus with hyperglycemia ([place]) 06/09/2017  . Tinea corporis 06/09/2017    Past Surgical History:  Procedure Laterality Date  . BACK SURGERY    . CARDIAC CATHETERIZATION    . CHOLECYSTECTOMY    . COLONOSCOPY WITH PROPOFOL N/A 04/12/2019   Procedure: COLONOSCOPY WITH PROPOFOL;  Surgeon: [name], MD;  Location: [facility] ENDOSCOPY;  Service:  Gastroenterology;  Laterality: N/A;  . ESOPHAGOGASTRODUODENOSCOPY N/A 04/12/2019   Procedure: ESOPHAGOGASTRODUODENOSCOPY (EGD);  Surgeon: [name], MD;  Location: [facility] ENDOSCOPY;  Service: Gastroenterology;  Laterality: N/A;  . ESOPHAGOGASTRODUODENOSCOPY (EGD) WITH PROPOFOL N/A 11/21/2019   Procedure: ESOPHAGOGASTRODUODENOSCOPY (EGD) WITH PROPOFOL;  Surgeon: [name], MD;  Location: [facility] ENDOSCOPY;  Service: Gastroenterology;  Laterality: N/A;  . FLEXIBLE BRONCHOSCOPY Bilateral 05/17/2019   Procedure: FLEXIBLE BRONCHOSCOPY;  Surgeon: [name], MD;  Location: [name] ORS;  Service: Pulmonary;  Laterality: Bilateral;  . left arm surgery    . nephrectomy Left   . PARATHYROIDECTOMY    . RIGHT HEART CATH N/A 03/29/2019   Procedure: RIGHT HEART CATH;  Surgeon: [name], MD;  Location: [facility] CV LAB;  Service: Cardiovascular;  Laterality: N/A;    Prior to Admission medications   Medication Sig Start Date End Date Taking? Authorizing Provider  acetaminophen (TYLENOL) 325 MG tablet Take 2 tablets (650 mg total) by mouth every 4 (four) hours as needed for headache or mild pain. 03/05/19   Gouru, Illene Silver, MD  albuterol (VENTOLIN HFA) 108 (90 Base) MCG/ACT inhaler Inhale 2 puffs into the lungs every 6 (six) hours as needed for wheezing or shortness of breath. 03/05/19   Gouru, Illene Silver, MD  amLODipine (NORVASC) 10 MG tablet Take 1 tablet (10 mg total) by mouth daily. 08/01/19   [name], NP  benzonatate (TESSALON PERLES) 100 MG capsule Take 1 capsule (100 mg total) by mouth 3 (three) times daily as needed for cough. 12/10/19 12/09/20  Carrie Mew, MD  Blood Glucose Calibration (TRUE METRIX LEVEL 1) Low SOLN Use as directed 04/04/18   [name], NP  Blood Glucose Monitoring Suppl (TRUE METRIX AIR GLUCOSE METER) DEVI 1 Device by Does not apply route 2 (two) times daily. 04/13/18   [name], NP  ferrous gluconate (FERGON) 324 MG tablet Take 1 tablet  (324 mg total) by mouth daily with breakfast. 08/01/19   [name], NP  furosemide (LASIX) 80 MG tablet Hold until outpatient PCP followup due to acute kidney injury. 11/21/19   Enzo Bi, MD  gabapentin (NEURONTIN) 100 MG capsule Take 1 capsule (100 mg total) by mouth in the morning, at noon, and at bedtime. 11/26/19   [name], NP  gemfibrozil (LOPID) 600 MG tablet Take 1 tablet (600 mg total) by mouth 2 (two) times daily with a meal. 06/27/19   Boscia, Greer Ee, NP  glimepiride (AMARYL) 2 MG tablet Take 1 tablet (2 mg total) by mouth at bedtime. 08/02/19   Lavera Guise, MD  glucose blood (TRUE METRIX BLOOD GLUCOSE TEST) test strip Use as instructed twice daily diag E11.65 07/30/19   [name], NP  hydrALAZINE (APRESOLINE) 100 MG tablet Take 1 tablet (100 mg total) by mouth 3 (three) times daily. 01/06/20   [name], NP  hydrOXYzine (ATARAX) 10 MG/5ML syrup Take 5 mLs (10 mg total) by mouth 3 (three) times daily as needed for itching. 09/24/19   Sable Feil, PA-C  insulin glargine, 1 Unit Dial, (TOUJEO)  300 UNIT/ML Solostar Pen Inject 26 Units into the skin daily. 10/24/19   [name], NP  ipratropium-albuterol (DUONEB) 0.5-2.5 (3) MG/3ML SOLN Take 3 mLs by nebulization every 6 (six) hours as needed. 03/08/19   Kendell Bane, NP  isosorbide mononitrate (IMDUR) 30 MG 24 hr tablet Take 1 tablet (30 mg total) by mouth daily. 12/03/19   [name], MD  loratadine (CLARITIN) 10 MG tablet Take 1 tablet (10 mg total) by mouth daily as needed for allergies. 11/21/19   Enzo Bi, MD  metoprolol succinate (TOPROL-XL) 50 MG 24 hr tablet Take 1 tablet (50 mg total) by mouth at bedtime. Take with or immediately following a meal. 07/10/19   Lavera Guise, MD  naproxen (NAPROSYN) 500 MG tablet Take 1 tablet (500 mg total) by mouth 2 (two) times daily as needed. 11/21/19   Enzo Bi, MD  nitroGLYCERIN (NITROSTAT) 0.4 MG SL tablet Place 1 tablet (0.4 mg total) under the  tongue every 5 (five) minutes x 3 doses as needed for chest pain. 05/24/19   Loletha Grayer, MD  NOVOFINE 32G X 6 MM MISC To use with [place] injections daily 12/13/18   [name], NP  omeprazole (PRILOSEC) 40 MG capsule 1 cap daily Patient taking differently: Take 40 mg by mouth daily.  07/10/19   Lavera Guise, MD  potassium chloride SA (KLOR-CON) 20 MEQ tablet Take 1 tablet (20 mEq total) by mouth 2 (two) times daily. 11/26/19   [name], NP  predniSONE (DELTASONE) 50 MG tablet Take 1 tablet (50 mg total) by mouth daily with breakfast. 01/08/20   Lavonia Drafts, MD  rosuvastatin (CRESTOR) 10 MG tablet Take 1 tablet (10 mg total) by mouth daily. 08/23/19 11/21/19  [name], MD  sodium chloride (OCEAN) 0.65 % SOLN nasal spray Place 1 spray into both nostrils as needed for congestion. 06/18/19   Lavina Hamman, MD  tiotropium (SPIRIVA HANDIHALER) 18 MCG inhalation capsule Place 1 capsule (18 mcg total) into inhaler and inhale daily. 08/07/19 11/19/19  [name], NP  TRUEplus Lancets 33G MISC Use as directed twice daily diag E11.65 07/30/19   [name], NP  vitamin B-12 (CYANOCOBALAMIN) 1000 MCG tablet Take 1 tablet (1,000 mcg total) by mouth daily. 11/21/19   Enzo Bi, MD    Allergies Penicillins  Family History  Problem Relation Age of Onset  . Diabetes Mother   . Lung cancer Father   . Diabetes Sister   . Hypertension Sister   . Heart disease Sister   . Cancer Sister   . Bone cancer Brother     Social History Social History   Tobacco Use  . Smoking status: Former Smoker    Types: Cigarettes  . Smokeless tobacco: Former Systems developer    Types: Secondary school teacher  . Vaping Use: Never used  Substance Use Topics  . Alcohol use: No  . Drug use: No    Review of Systems  Constitutional: No fever/chills Eyes: No visual changes. ENT: No sore throat. Cardiovascular: Denies chest pain. Respiratory: Denies shortness of breath. Gastrointestinal: No abdominal  pain.  No nausea, no vomiting.  No diarrhea.  No constipation. Genitourinary: Negative for dysuria. Musculoskeletal: Negative for back pain. Skin: Negative for rash. Neurological: Negative for headaches, focal weakness   ____________________________________________   PHYSICAL EXAM:  VITAL SIGNS: ED Triage Vitals  Enc Vitals Group     BP 01/08/20 1736 (!) 164/78     Pulse Rate  01/08/20 1736 73     Resp 01/08/20 1736 16     Temp 01/08/20 1736 98.7 F (37.1 C)     Temp Source 01/08/20 1736 Oral     SpO2 01/08/20 1736 97 %     Weight 01/08/20 1734 (!) 253 lb 1.4 oz (114.8 kg)     Height 01/08/20 1734 _0  (1.803 m)     Head Circumference --      Peak Flow --      Pain Score 01/08/20 1734 0     Pain Loc --      Pain Edu? --      Excl. in [place]? --     Constitutional: Alert and oriented. Well appearing and in no acute distress. Eyes: Conjunctivae are normal.  Head: Atraumatic. Nose: No congestion/rhinnorhea. Mouth/Throat: Mucous membranes are moist.  Oropharynx non-erythematous. Neck: No stridor. Cardiovascular: Normal rate, regular rhythm. Grossly normal heart sounds.  Good peripheral circulation. Respiratory: Normal respiratory effort.  No retractions. Lungs CTAB. Gastrointestinal: Soft and nontender patient does have very an umbilical hernia.. No distention. No abdominal bruits. No CVA tenderness. Musculoskeletal: No lower extremity tenderness   Neurologic:  Normal speech and language. No gross focal neurologic deficits are appreciated. No gait instability. Skin:  Skin is warm, dry and intact. No rash noted.   ____________________________________________   LABS (all labs ordered are listed, but only abnormal results are displayed)  Labs Reviewed  BASIC METABOLIC PANEL - Abnormal; Notable for the following components:      Result Value   Glucose, Bld 166 (*)    BUN 54 (*)    Creatinine, Ser 2.28 (*)    GFR calc non Af Amer 29 (*)    GFR calc Af Amer 33 (*)    All  other components within normal limits  CBC WITH DIFFERENTIAL/PLATELET - Abnormal; Notable for the following components:   RBC 4.15 (*)    Hemoglobin 11.8 (*)    HCT 37.8 (*)    All other components within normal limits  SARS CORONAVIRUS 2 BY RT PCR (HOSPITAL ORDER, [facility])  TYPE AND SCREEN   ____________________________________________  EKG   ____________________________________________  RADIOLOGY  ED MD interpretation:    Official radiology report(s): DG Chest 2 View  Result Date: 01/08/2020 CLINICAL DATA:  Shortness of breath, cough EXAM: CHEST - 2 VIEW COMPARISON:  12/21/2019 FINDINGS: Heart and mediastinal contours are within normal limits. No focal opacities or effusions. No acute bony abnormality. IMPRESSION: No active cardiopulmonary disease. Electronically Signed   By: Rolm Baptise M.D.   On: 01/08/2020 09:50    ____________________________________________   PROCEDURES  Procedure(s) performed (including Critical Care):  Procedures   ____________________________________________   INITIAL IMPRESSION / ASSESSMENT AND PLAN / ED COURSE In the past patient has had both an EGD and a bronchoscopy.  Now he says he is vomiting blood.  He describes at least one large amount of emesis with bright red blood in it.  The paper towel he has is covered with fairly large spots of bright red blood.  We will get him in the hospital for further evaluation.    ----------------------------------------- 2:09 AM on 01/09/2020 -----------------------------------------  Patient with no further hemoptysis or hematemesis tonight.  His creatinine is gotten worse since yesterday.  With the drop in his GFR.  This may be due to some dehydration.  As his H&H actually has gone up somewhat.  Because his renal function is worsening then because he is  complaining of large amounts of hematemesis at home and can demonstrate at least some hematemesis on his paper towel  here we will get him in the hospital.  He will likely need another EGD and or bronchoscopy as well.         ____________________________________________   FINAL CLINICAL IMPRESSION(S) / ED DIAGNOSES  Final diagnoses:  Hematemesis, presence of nausea not specified  Worsening renal function     ED Discharge Orders    None       Note:  This document was prepared using Dragon voice recognition software and may include unintentional dictation errors.    Nena Polio, MD 01/09/20 [phone]

## 2020-01-10 ENCOUNTER — Telehealth: Payer: Self-pay

## 2020-01-10 ENCOUNTER — Other Ambulatory Visit: Payer: Self-pay

## 2020-01-10 DIAGNOSIS — I10 Essential (primary) hypertension: Secondary | ICD-10-CM | POA: Diagnosis not present

## 2020-01-10 DIAGNOSIS — K920 Hematemesis: Secondary | ICD-10-CM | POA: Diagnosis not present

## 2020-01-10 DIAGNOSIS — J449 Chronic obstructive pulmonary disease, unspecified: Secondary | ICD-10-CM | POA: Diagnosis not present

## 2020-01-10 LAB — CBC
HCT: 34.6 % — ABNORMAL LOW (ref 39.0–52.0)
Hemoglobin: 10.8 g/dL — ABNORMAL LOW (ref 13.0–17.0)
MCH: 28.5 pg (ref 26.0–34.0)
MCHC: 31.2 g/dL (ref 30.0–36.0)
MCV: 91.3 fL (ref 80.0–100.0)
Platelets: 197 10*3/uL (ref 150–400)
RBC: 3.79 MIL/uL — ABNORMAL LOW (ref 4.22–5.81)
RDW: 13.4 % (ref 11.5–15.5)
WBC: 6.2 10*3/uL (ref 4.0–10.5)
nRBC: 0 % (ref 0.0–0.2)

## 2020-01-10 LAB — BASIC METABOLIC PANEL
Anion gap: 8 (ref 5–15)
BUN: 43 mg/dL — ABNORMAL HIGH (ref 8–23)
CO2: 24 mmol/L (ref 22–32)
Calcium: 9 mg/dL (ref 8.9–10.3)
Chloride: 111 mmol/L (ref 98–111)
Creatinine, Ser: 1.81 mg/dL — ABNORMAL HIGH (ref 0.61–1.24)
GFR calc Af Amer: 44 mL/min — ABNORMAL LOW (ref >60)
GFR calc non Af Amer: 38 mL/min — ABNORMAL LOW (ref >60)
Glucose, Bld: 140 mg/dL — ABNORMAL HIGH (ref 70–99)
Potassium: 4 mmol/L (ref 3.5–5.1)
Sodium: 143 mmol/L (ref 135–145)

## 2020-01-10 LAB — GLUCOSE, CAPILLARY
Glucose-Capillary: 111 mg/dL — ABNORMAL HIGH (ref 70–99)
Glucose-Capillary: 130 mg/dL — ABNORMAL HIGH (ref 70–99)
Glucose-Capillary: 139 mg/dL — ABNORMAL HIGH (ref 70–99)
Glucose-Capillary: 207 mg/dL — ABNORMAL HIGH (ref 70–99)

## 2020-01-10 MED ORDER — OMEPRAZOLE 40 MG PO CPDR: 40.0000 mg | DELAYED_RELEASE_CAPSULE | Freq: Two times a day (BID) | ORAL | 0 refills | Status: DC

## 2020-01-10 MED ORDER — SUCRALFATE 1 G PO TABS: 1.0000 g | ORAL_TABLET | Freq: Three times a day (TID) | ORAL | Status: DC

## 2020-01-10 MED ORDER — SUCRALFATE 1 G PO TABS: 1.0000 g | ORAL_TABLET | Freq: Three times a day (TID) | ORAL | 0 refills | Status: DC

## 2020-01-10 NOTE — Plan of Care (Signed)

## 2020-01-10 NOTE — Evaluation (Signed)
Physical Therapy Evaluation Patient Details Name: Larry Callahan MRN: [id number] DOB: 09-Jun-1956 Today's Date: 01/10/2020   History of Present Illness  Pt is a 64 y.o. male with medical history significant for hypertension, type 2 diabetes mellitus, chronic systolic CHF, chronic kidney disease stage IIIb, and COPD. Per MD impression, pt currently presents with hematemesis.  Clinical Impression  Pt pleasant and motivated to participate during the session. Pt mod-I with bed mobility and transfers, w/ mildly increased time. Pt able to walk 170ft w/ no AD; steady gait with good cadence and no noted staggering or LOB. Pt ascended/descended 4xstairs w/ bilateral hand rail with an alternating pattern and no appeared difficulty. CGA only applied as a precaution for ambulation and stairs. Pt SpO2 and HR remained WFL throughout session. No skilled PT needs identified during session; pt reported feeling like he is at his baseline level of function. Will complete PT orders at this time but will reassess pt pending a change in status upon receipt of new PT orders.     Follow Up Recommendations No PT follow up    Equipment Recommendations  None recommended by PT    Recommendations for Other Services       Precautions / Restrictions Precautions Precautions: None Restrictions Weight Bearing Restrictions: No      Mobility  Bed Mobility Overal bed mobility: Modified Independent             General bed mobility comments: slight increased time as well as raised HOB and use of rails  Transfers Overall transfer level: Modified independent               General transfer comment: rises easily w/ no AD; CGA provided for safety  Ambulation/Gait Ambulation/Gait assistance: Independent Gait Distance (Feet): 150 Feet Assistive device: None Gait Pattern/deviations: Wide base of support;Step-through pattern Gait velocity: normal   General Gait Details: CGA provided as a precaution;  pt able to walk w/ no use of AD w/ a step-through patten and a wide BOS . Pt appeared steady on his feet at all times and no observed LOB  Stairs Stairs: Yes Stairs assistance: Independent Stair Management: Alternating pattern;Forwards;Two rails Number of Stairs: 4 General stair comments: Pt moved quickly up and down the stairs w/ no appeared difficulty and only mild use of bilateral hand rails. CGA provided as a precaution  Wheelchair Mobility    Modified Rankin (Stroke Patients Only)       Balance Overall balance assessment: No apparent balance deficits (not formally assessed)                                           Pertinent Vitals/Pain Pain Assessment: No/denies pain Pain Score: 2  Pain Location: Headache Pain Descriptors / Indicators: Headache Pain Intervention(s): Monitored during session    Home Living Family/patient expects to be discharged to:: Private residence Living Arrangements: Alone Available Help at Discharge: Family;Available PRN/intermittently (sisters) Type of Home: Mobile home Home Access: Stairs to enter Entrance Stairs-Rails: Left Entrance Stairs-Number of Steps: 5 Home Layout: One level Home Equipment: Cane - single point;Bedside commode;Walker - 2 wheels Additional Comments: has CPAP machine    Prior Function Level of Independence: Independent         Comments: Reports no falls in last 6 months; primarily independent w/o AD but occasionally uses RW. Pt reports mows lawn w/ push mower and walks through grocery  store w/ no AD     Hand Dominance        Extremity/Trunk Assessment   Upper Extremity Assessment Upper Extremity Assessment: Overall WFL for tasks assessed    Lower Extremity Assessment Lower Extremity Assessment: Overall WFL for tasks assessed       Communication   Communication: No difficulties  Cognition Arousal/Alertness: Awake/alert Behavior During Therapy: WFL for tasks  assessed/performed Overall Cognitive Status: Within Functional Limits for tasks assessed                                        General Comments General comments (skin integrity, edema, etc.):     Exercises    Assessment/Plan    PT Assessment Patent does not need any further PT services  PT Problem List         PT Treatment Interventions      PT Goals (Current goals can be found in the Care Plan section)  Acute Rehab PT Goals Patient Stated Goal: To return home safely  PT Goal Formulation: All assessment and education complete, DC therapy    Frequency     Barriers to discharge        Co-evaluation               AM-PAC PT "6 Clicks" Mobility  Outcome Measure Help needed turning from your back to your side while in a flat bed without using bedrails?: None Help needed moving from lying on your back to sitting on the side of a flat bed without using bedrails?: None Help needed moving to and from a bed to a chair (including a wheelchair)?: None Help needed standing up from a chair using your arms (e.g., wheelchair or bedside chair)?: None Help needed to walk in hospital room?: None Help needed climbing 3-5 steps with a railing? : None 6 Click Score: 24    End of Session Equipment Utilized During Treatment: Gait belt Activity Tolerance: Patient tolerated treatment well Patient left: in bed;with call bell/phone within reach;Other (comment) (pt left sitting at EOB) Nurse Communication: Mobility status      Time: 1572-6203 PT Time Calculation (min) (ACUTE ONLY): 10 min   Charges:              Herbert Moors SPT 01/10/20, 1:33 PM

## 2020-01-10 NOTE — Telephone Encounter (Signed)
Called lmom informing patient of appointment on 01/13/2020. klh

## 2020-01-10 NOTE — Progress Notes (Signed)
Ch visited with Pt as part of routine rounding. Pt requested prayer. Ch prayed with Pt. Pt said he was feeling okay. Physical therapist came in at the end of the visit.

## 2020-01-10 NOTE — Discharge Summary (Signed)
Physician Discharge Summary  Larry Callahan HEN:[id number] DOB: 09/15/1955 DOA: 01/09/2020  PCP: [name], NP  Admit date: 01/09/2020 Discharge date: 01/10/2020  Admitted From: home Disposition:  home  Recommendations for Outpatient Follow-up:  1. Follow up with PCP in 1 week 2. F/u GI w/in 1 week  Home Health: no  Equipment/Devices:  Discharge Condition: stable CODE STATUS: full Diet recommendation: Heart Healthy / Carb Modified   Brief/Interim Summary:  HPI was taken from Dr. Myna Hidalgo: Larry Callahan is a 64 y.o. male with medical history significant for hypertension, type 2 diabetes mellitus, chronic systolic CHF, chronic kidney disease stage IIIb, and COPD, now presenting to the emergency department for evaluation of hematemesis.  Patient was seen in the ED yesterday with shortness of breath and reported scant hemoptysis at that time.  He is breathing improved significantly with a breathing treatment in the ED yesterday and he went home, but now returns reporting a large volume of hematemesis.  Patient states that he has not been short of breath or coughing much since the ED visit yesterday, does not have any abdominal pain, and has not noticed any melena or hematochezia but had one episode of vomiting with bright red blood.  Patient reports that it looked like approximately 1 cup.  He denies any fevers or chills, denies chest pain, and has not noticed any leg swelling or tenderness.  ED Course: Upon arrival to the ED, patient is found to be afebrile, saturating well on room air, and with stable blood pressure.  EKG features a sinus rhythm and chest x-rays negative for acute cardiopulmonary disease.  Chemistry panel is notable for creatinine of 2.28, up from 1.96 the day prior.  CBC with slight normocytic anemia.  Type and screen was performed in the emergency department, patient was given IV Protonix, and hospitalist consulted for admission.   Hospital Course from  Dr. Lenise Herald 7/29-7/30/21: Pt presented w/ hematemesis of unknown etiology. Pt's vitals remained stable throughout stay at the hospital. Pt's H&H did drop slightly but did not require a transfusion. Pt was possibly taking NSAIDs at home as per his home med rec. Of note, pt recently had an EGD in June 2021 which was all normal. Pt was d/c home w/ PPI 40mg  BID as well as carafate. Pt was told to f/u w/ GI within 1 week for further evaluation and treatment. Pt verbalized his understanding.   Discharge Diagnoses:  Principal Problem:   Hematemesis Active Problems:   Obstructive sleep apnea, adult   COPD (chronic obstructive pulmonary disease) (HCC)   Essential hypertension   Chronic systolic CHF (congestive heart failure) (HCC)   Chronic kidney disease, stage 3b   Type 2 diabetes mellitus with stage 3b chronic kidney disease, with long-term current use of insulin (HCC)  Hematemesis: etiology unclear. Possible NSAID use at home. H&H are stable. EGD from June 2021 with normal esophagus, stomach, and duodenum through 2nd portion. Continue on PPI & carafate  COPD: w/o exacerbation. Continue on bronchodilators   Chronic systolic CHF: echo showed EF 45-50% in November 2020. Without exacerbation  Hypertension: will continue home dose of amlodipine. Hydralazine prn  DM2: HbA1c was 7.1% in June 2021, fairly well controlled. Continue on SSI w/ accuchecks   CKD IIIb: Cr is trending down from day prior. Avoid nephrotoxic meds. Will continue to monitor   Obesity: BMI 35.3. Would benefit from weight loss   Discharge Instructions  Discharge Instructions    Diet - low sodium heart healthy  Complete by: As directed    Diet Carb Modified   Complete by: As directed    Discharge instructions   Complete by: As directed    F/u GI in 1 week. May need to see your PCP first to get a referral to see a GI. F/u PCP in 1 week   Increase activity slowly   Complete by: As directed      Allergies as of  01/10/2020      Reactions   Penicillins Rash      Medication List    STOP taking these medications   naproxen 500 MG tablet Commonly known as: Naprosyn   sodium chloride 0.65 % Soln nasal spray Commonly known as: OCEAN     TAKE these medications   acetaminophen 325 MG tablet Commonly known as: TYLENOL Take 2 tablets (650 mg total) by mouth every 4 (four) hours as needed for headache or mild pain.   albuterol 108 (90 Base) MCG/ACT inhaler Commonly known as: VENTOLIN HFA Inhale 2 puffs into the lungs every 6 (six) hours as needed for wheezing or shortness of breath.   amLODipine 10 MG tablet Commonly known as: NORVASC Take 1 tablet (10 mg total) by mouth daily.   benzonatate 100 MG capsule Commonly known as: Tessalon Perles Take 1 capsule (100 mg total) by mouth 3 (three) times daily as needed for cough.   ferrous gluconate 324 MG tablet Commonly known as: FERGON Take 1 tablet (324 mg total) by mouth daily with breakfast.   furosemide 80 MG tablet Commonly known as: LASIX Hold until outpatient PCP followup due to acute kidney injury.   gabapentin 100 MG capsule Commonly known as: NEURONTIN Take 1 capsule (100 mg total) by mouth in the morning, at noon, and at bedtime.   gemfibrozil 600 MG tablet Commonly known as: LOPID Take 1 tablet (600 mg total) by mouth 2 (two) times daily with a meal.   glimepiride 2 MG tablet Commonly known as: AMARYL Take 1 tablet (2 mg total) by mouth at bedtime.   hydrALAZINE 100 MG tablet Commonly known as: APRESOLINE Take 1 tablet (100 mg total) by mouth 3 (three) times daily.   hydrOXYzine 10 MG/5ML syrup Commonly known as: ATARAX Take 5 mLs (10 mg total) by mouth 3 (three) times daily as needed for itching.   insulin glargine (1 Unit Dial) 300 UNIT/ML Solostar Pen Commonly known as: TOUJEO Inject 26 Units into the skin daily.   ipratropium-albuterol 0.5-2.5 (3) MG/3ML Soln Commonly known as: DUONEB Take 3 mLs by nebulization  every 6 (six) hours as needed.   isosorbide mononitrate 30 MG 24 hr tablet Commonly known as: IMDUR Take 1 tablet (30 mg total) by mouth daily.   loratadine 10 MG tablet Commonly known as: CLARITIN Take 1 tablet (10 mg total) by mouth daily as needed for allergies.   metoprolol succinate 50 MG 24 hr tablet Commonly known as: TOPROL-XL Take 1 tablet (50 mg total) by mouth at bedtime. Take with or immediately following a meal.   nitroGLYCERIN 0.4 MG SL tablet Commonly known as: NITROSTAT Place 1 tablet (0.4 mg total) under the tongue every 5 (five) minutes x 3 doses as needed for chest pain.   NovoFine 32G X 6 MM Misc Generic drug: Insulin Pen Needle To use with [place] injections daily   omeprazole 40 MG capsule Commonly known as: PRILOSEC Take 1 capsule (40 mg total) by mouth in the morning and at bedtime. What changed:   how to take this  when to take this   potassium chloride SA 20 MEQ tablet Commonly known as: KLOR-CON Take 1 tablet (20 mEq total) by mouth 2 (two) times daily.   predniSONE 50 MG tablet Commonly known as: DELTASONE Take 1 tablet (50 mg total) by mouth daily with breakfast.   rosuvastatin 10 MG tablet Commonly known as: CRESTOR Take 1 tablet (10 mg total) by mouth daily.   Spiriva HandiHaler 18 MCG inhalation capsule Generic drug: tiotropium Place 1 capsule (18 mcg total) into inhaler and inhale daily.   sucralfate 1 g tablet Commonly known as: CARAFATE Take 1 tablet (1 g total) by mouth 4 (four) times daily -  with meals and at bedtime.   True Metrix Air Glucose Meter Devi 1 Device by Does not apply route 2 (two) times daily.   True Metrix Blood Glucose Test test strip Generic drug: glucose blood Use as instructed twice daily diag E11.65   True Metrix Level 1 Low Soln Use as directed   TRUEplus Lancets 33G Misc Use as directed twice daily diag E11.65   vitamin B-12 1000 MCG tablet Commonly known as: CYANOCOBALAMIN Take 1 tablet (1,000  mcg total) by mouth daily.       Allergies  Allergen Reactions  . Penicillins Rash    Consultations:   Procedures/Studies: DG Chest 2 View  Result Date: 01/08/2020 CLINICAL DATA:  Shortness of breath, cough EXAM: CHEST - 2 VIEW COMPARISON:  12/21/2019 FINDINGS: Heart and mediastinal contours are within normal limits. No focal opacities or effusions. No acute bony abnormality. IMPRESSION: No active cardiopulmonary disease. Electronically Signed   By: Rolm Baptise M.D.   On: 01/08/2020 09:50   DG Chest Portable 1 View  Result Date: 12/21/2019 CLINICAL DATA:  Chest pain and shortness of breath EXAM: PORTABLE CHEST 1 VIEW COMPARISON:  December 03, 2019 chest radiograph and chest CT November 19, 2019 FINDINGS: No edema or airspace opacity. Mild chronic interstitial thickening is stable. Heart size and pulmonary vascular normal. No adenopathy. No bone lesions. IMPRESSION: Mild chronic interstitial thickening is stable. No edema or airspace opacity. Cardiac silhouette normal. Electronically Signed   By: Lowella Grip III M.D.   On: 12/21/2019 11:55      Subjective: Pt c/o fatigue   Discharge Exam: Vitals:   01/10/20 0750 01/10/20 0947  BP: (!) 138/120 (!) 148/75  Pulse: 65   Resp: 18   Temp: 97.8 F (36.6 C)   SpO2: 95%    Vitals:   01/10/20 0403 01/10/20 0439 01/10/20 0750 01/10/20 0947  BP: (!) 143/67  (!) 138/120 (!) 148/75  Pulse: 59  65   Resp: 16  18   Temp: 97.6 F (36.4 C)  97.8 F (36.6 C)   TempSrc: Oral  Oral   SpO2: 97%  95%   Weight:  (!) 117.7 kg    Height:        General: Pt is alert, awake, not in acute distress Cardiovascular: S1/S2 +, no rubs, no gallops Respiratory: decreased breath sounds b/l, no wheezing, no rhonchi Abdominal: Soft, NT, obese, bowel sounds + Extremities:  no cyanosis    The results of significant diagnostics from this hospitalization (including imaging, microbiology, ancillary and laboratory) are listed below for reference.      Microbiology: Recent Results (from the past 240 hour(s))  SARS Coronavirus 2 by RT PCR (hospital order, performed in [facility] hospital lab) Nasopharyngeal Nasopharyngeal Swab     Status: None   Collection Time: 01/09/20  1:20 AM  Specimen: Nasopharyngeal Swab  Result Value Ref Range Status   SARS Coronavirus 2 NEGATIVE NEGATIVE Final    Comment: (NOTE) SARS-CoV-2 target nucleic acids are NOT DETECTED.  The SARS-CoV-2 RNA is generally detectable in upper and lower respiratory specimens during the acute phase of infection. The lowest concentration of SARS-CoV-2 viral copies this assay can detect is 250 copies / mL. A negative result does not preclude SARS-CoV-2 infection and should not be used as the sole basis for treatment or other patient management decisions.  A negative result may occur with improper specimen collection / handling, submission of specimen other than nasopharyngeal swab, presence of viral mutation(s) within the areas targeted by this assay, and inadequate number of viral copies (<250 copies / mL). A negative result must be combined with clinical observations, patient history, and epidemiological information.  Fact Sheet for Patients:   StrictlyIdeas.no  Fact Sheet for Healthcare Providers: BankingDealers.co.za  This test is not yet approved or  cleared by the [place] FDA and has been authorized for detection and/or diagnosis of SARS-CoV-2 by FDA under an Emergency Use Authorization (EUA).  This EUA will remain in effect (meaning this test can be used) for the duration of the COVID-19 declaration under Section 564(b)(1) of the Act, 21 U.S.C. section 360bbb-3(b)(1), unless the authorization is terminated or revoked sooner.  Performed at [facility], [address] [place], [place] [address]      Labs: BNP (last 3 results) Recent Labs    05/19/19 1522 11/10/19 1928 12/21/19 1111   BNP 143.0* 23.1 83.4   Basic Metabolic Panel: Recent Labs  Lab 01/08/20 0910 01/09/20 0038 01/09/20 0356 01/10/20 0352  NA 138 144 143 143  K 4.3 3.7 3.6 4.0  CL 103 107 107 111  CO2 23 24 23 24   GLUCOSE 268* 166* 132* 140*  BUN 58* 54* 53* 43*  CREATININE 1.96* 2.28* 2.09* 1.81*  CALCIUM 9.0 9.1 8.8* 9.0   Liver Function Tests: Recent Labs  Lab 01/08/20 0910  AST 19  ALT 15  ALKPHOS 73  BILITOT 0.6  PROT 7.3  ALBUMIN 3.7   No results for input(s): LIPASE, AMYLASE in the last 168 hours. No results for input(s): AMMONIA in the last 168 hours. CBC: Recent Labs  Lab 01/08/20 0910 01/09/20 0038 01/09/20 0356 01/10/20 0352  WBC 6.4 7.8  --  6.2  NEUTROABS 4.6 5.3  --   --   HGB 11.1* 11.8* 11.2* 10.8*  HCT 35.0* 37.8* 34.7* 34.6*  MCV 91.6 91.1  --  91.3  PLT 201 224  --  197   Cardiac Enzymes: No results for input(s): CKTOTAL, CKMB, CKMBINDEX, TROPONINI in the last 168 hours. BNP: Invalid input(s): POCBNP CBG: Recent Labs  Lab 01/09/20 2008 01/10/20 0042 01/10/20 0358 01/10/20 0753 01/10/20 1151  GLUCAP 183* 139* 130* 111* 207*   D-Dimer No results for input(s): DDIMER in the last 72 hours. Hgb A1c No results for input(s): HGBA1C in the last 72 hours. Lipid Profile No results for input(s): CHOL, HDL, LDLCALC, TRIG, CHOLHDL, LDLDIRECT in the last 72 hours. Thyroid function studies No results for input(s): TSH, T4TOTAL, T3FREE, THYROIDAB in the last 72 hours.  Invalid input(s): FREET3 Anemia work up No results for input(s): VITAMINB12, FOLATE, FERRITIN, TIBC, IRON, RETICCTPCT in the last 72 hours. Urinalysis    Component Value Date/Time   COLORURINE PINK (A) 12/10/2019 1230   APPEARANCEUR CLOUDY (A) 12/10/2019 1230   APPEARANCEUR Cloudy (A) 11/23/2018 1103   LABSPEC 1.009 12/10/2019  1230   LABSPEC 1.005 03/06/2013 0929   PHURINE 5.0 12/10/2019 1230   GLUCOSEU NEGATIVE 12/10/2019 1230   GLUCOSEU Negative 03/06/2013 0929   HGBUR LARGE (A)  12/10/2019 1230   BILIRUBINUR NEGATIVE 12/10/2019 1230   BILIRUBINUR negative 02/20/2019 1404   BILIRUBINUR Negative 11/23/2018 1103   BILIRUBINUR Negative 03/06/2013 0929   KETONESUR NEGATIVE 12/10/2019 1230   PROTEINUR 100 (A) 12/10/2019 1230   UROBILINOGEN 0.2 02/20/2019 1404   NITRITE NEGATIVE 12/10/2019 1230   LEUKOCYTESUR TRACE (A) 12/10/2019 1230   LEUKOCYTESUR Negative 03/06/2013 0929   Sepsis Labs Invalid input(s): PROCALCITONIN,  WBC,  LACTICIDVEN Microbiology Recent Results (from the past 240 hour(s))  SARS Coronavirus 2 by RT PCR (hospital order, performed in [facility] hospital lab) Nasopharyngeal Nasopharyngeal Swab     Status: None   Collection Time: 01/09/20  1:20 AM   Specimen: Nasopharyngeal Swab  Result Value Ref Range Status   SARS Coronavirus 2 NEGATIVE NEGATIVE Final    Comment: (NOTE) SARS-CoV-2 target nucleic acids are NOT DETECTED.  The SARS-CoV-2 RNA is generally detectable in upper and lower respiratory specimens during the acute phase of infection. The lowest concentration of SARS-CoV-2 viral copies this assay can detect is 250 copies / mL. A negative result does not preclude SARS-CoV-2 infection and should not be used as the sole basis for treatment or other patient management decisions.  A negative result may occur with improper specimen collection / handling, submission of specimen other than nasopharyngeal swab, presence of viral mutation(s) within the areas targeted by this assay, and inadequate number of viral copies (<250 copies / mL). A negative result must be combined with clinical observations, patient history, and epidemiological information.  Fact Sheet for Patients:   StrictlyIdeas.no  Fact Sheet for Healthcare Providers: BankingDealers.co.za  This test is not yet approved or  cleared by the [place] FDA and has been authorized for detection and/or diagnosis of SARS-CoV-2 by FDA  under an Emergency Use Authorization (EUA).  This EUA will remain in effect (meaning this test can be used) for the duration of the COVID-19 declaration under Section 564(b)(1) of the Act, 21 U.S.C. section 360bbb-3(b)(1), unless the authorization is terminated or revoked sooner.  Performed at [facility], [address], [place], [place] [address]      Time coordinating discharge: Over 30 minutes  SIGNED:   Wyvonnia Dusky, MD  [facility] 01/10/2020, 2:25 PM Pager   If 7PM-7AM, please contact night-coverage [email or web address]

## 2020-01-10 NOTE — Evaluation (Signed)
Occupational Therapy Evaluation Patient Details Name: Larry Callahan MRN: [id number] DOB: 09-09-1955 Today's Date: 01/10/2020    History of Present Illness Larry Callahan is a 64 y.o. male with medical history significant for hypertension, type 2 diabetes mellitus, chronic systolic CHF, chronic kidney disease stage IIIb, and COPD, now presenting to the emergency department for evaluation of hematemesis.  Patient was seen in the ED yesterday with shortness of breath and reported scant hemoptysis at that time. He is breathing improved significantly with a breathing treatment in the ED yesterday and he went home, but now returns reporting a large volume of hematemesis.    Clinical Impression   Mr Larry Callahan was seen for OT evaluation this date. Prior to hospital admission, pt was Independent c mobility. Pt lives alone c 2 sisters available PRN. Pt presents to OT grossly MOD I for functional mobility and ADLs, requiring VCs only for seated rest breaks. Pt educated in energy conservation strategies including activity pacing, home/routines modifications, work simplification, AE/DME, and falls prevention. Pt stated would not use shower seat although demonstrated decreased activity tolerance this date. Pt has no further skilled acute OT needs at this time, will sign off. Please re-consult if new needs arise. Anticipate no follow up OT needs.      Follow Up Recommendations  No OT follow up;Supervision - Intermittent    Equipment Recommendations  None recommended by OT    Recommendations for Other Services       Precautions / Restrictions Precautions Precautions: None Restrictions Weight Bearing Restrictions: No      Mobility Bed Mobility               General bed mobility comments: Pt received and left sitting EOB   Transfers Overall transfer level: Modified independent               General transfer comment: rises easily c no AD     Balance Overall balance  assessment: Mild deficits observed, not formally tested                                         ADL either performed or assessed with clinical judgement   ADL Overall ADL's : Modified independent                                       General ADL Comments: Increased time for LB access, simulated UBD, and functinoal reach inside BOS      Vision         Perception     Praxis      Pertinent Vitals/Pain Pain Assessment: 0-10 Pain Score: 2  Pain Location: Headache Pain Descriptors / Indicators: Headache Pain Intervention(s): Monitored during session     Hand Dominance     Extremity/Trunk Assessment Upper Extremity Assessment Upper Extremity Assessment: Overall WFL for tasks assessed   Lower Extremity Assessment Lower Extremity Assessment: Overall WFL for tasks assessed       Communication Communication Communication: No difficulties   Cognition Arousal/Alertness: Awake/alert Behavior During Therapy: WFL for tasks assessed/performed Overall Cognitive Status: Within Functional Limits for tasks assessed  General Comments  SpO2 96% on RA     Exercises Exercises: Other exercises Other Exercises Other Exercises: Pt educated re: OT role, DME recs, d/c recs, ECS Other Exercises: sit<>stand, sitting/standing balance/tolerance, functional reach    Shoulder Instructions      Home Living Family/patient expects to be discharged to:: Private residence Living Arrangements: Alone Available Help at Discharge: Family;Available PRN/intermittently (sisters ) Type of Home: Mobile home Home Access: Stairs to enter Entrance Stairs-Number of Steps: 5 Entrance Stairs-Rails: Left Home Layout: One level     Bathroom Shower/Tub: Teacher, early years/pre: Standard     Home Equipment: Cane - single point;Bedside commode;Walker - 2 wheels          Prior Functioning/Environment Level  of Independence: Independent        Comments: Reports no falls in last 6 months         OT Problem List:        OT Treatment/Interventions:      OT Goals(Current goals can be found in the care plan section) Acute Rehab OT Goals Patient Stated Goal: To return home safely  OT Goal Formulation: With patient Time For Goal Achievement: 01/24/20 Potential to Achieve Goals: Good  OT Frequency:     Barriers to D/C:            Co-evaluation              AM-PAC OT "6 Clicks" Daily Activity     Outcome Measure Help from another person eating meals?: None Help from another person taking care of personal grooming?: None Help from another person toileting, which includes using toliet, bedpan, or urinal?: A Little Help from another person bathing (including washing, rinsing, drying)?: A Little Help from another person to put on and taking off regular upper body clothing?: None Help from another person to put on and taking off regular lower body clothing?: None 6 Click Score: 22   End of Session    Activity Tolerance: Patient tolerated treatment well Patient left: in bed;with call bell/phone within reach  OT Visit Diagnosis: Other abnormalities of gait and mobility (R26.89)                Time: 3382-5053 OT Time Calculation (min): 9 min Charges:  OT General Charges $OT Visit: 1 Visit OT Evaluation $OT Eval Low Complexity: 1 Low  Dessie Coma, M.S. OTR/L  01/10/20, 10:59 AM  ascom [phone]

## 2020-01-10 NOTE — Care Management Obs Status (Signed)
[facility] NOTIFICATION   Patient Details  Name: Larry Callahan MRN: [id number] Date of Birth: 10-12-1955   Medicare Observation Status Notification Given:  Yes    Shelbie Ammons, RN 01/10/2020, 10:43 AM

## 2020-01-10 NOTE — TOC Initial Note (Signed)
Transition of Care [facility]) - Initial/Assessment Note    Patient Details  Name: Larry Callahan MRN: [id number] Date of Birth: 01-21-1956  Transition of Care [facility]) CM/SW Contact:    Shelbie Ammons, RN Phone Number: 01/10/2020, 12:34 PM  Clinical Narrative:      RNCM assessed patient at bedside. Patient sitting up on side of bed, reports to feeling better but still reports he is spitting up some blood. Patient reports that he lives at home alone and he is independent with all of his ADL's. Patient denies any other needs at this time but reports he is waiting to speak with the doctor again.              Expected Discharge Plan: Home/Self Care Barriers to Discharge: No Barriers Identified   Patient Goals and CMS Choice        Expected Discharge Plan and Services Expected Discharge Plan: Home/Self Care   Discharge Planning Services: CM Consult   Living arrangements for the past 2 months: Single Family Home Expected Discharge Date: 01/10/20                                    Prior Living Arrangements/Services Living arrangements for the past 2 months: Single Family Home Lives with:: Self Patient language and need for interpreter reviewed:: Yes        Need for Family Participation in Patient Care: No (Comment) Care giver support system in place?: No (comment)   Criminal Activity/Legal Involvement Pertinent to Current Situation/Hospitalization: No - Comment as needed  Activities of Daily Living Home Assistive Devices/Equipment: None ADL Screening (condition at time of admission) Patient's cognitive ability adequate to safely complete daily activities?: No Is the patient deaf or have difficulty hearing?: No Does the patient have difficulty seeing, even when wearing glasses/contacts?: No Does the patient have difficulty concentrating, remembering, or making decisions?: No Patient able to express need for assistance with ADLs?: Yes Does the patient have difficulty  dressing or bathing?: No Independently performs ADLs?: Yes (appropriate for developmental age) Does the patient have difficulty walking or climbing stairs?: No Weakness of Legs: None Weakness of Arms/Hands: None  Permission Sought/Granted                  Emotional Assessment Appearance:: Appears stated age Attitude/Demeanor/Rapport: Engaged Affect (typically observed): Appropriate Orientation: : Oriented to Self, Oriented to Place, Oriented to  Time, Oriented to Situation Alcohol / Substance Use: Not Applicable Psych Involvement: No (comment)  Admission diagnosis:  Hematemesis [K92.0] Worsening renal function [N28.9] Hematemesis, presence of nausea not specified [K92.0] Patient Active Problem List   Diagnosis Date Noted   Hematemesis 01/09/2020   Hospital discharge follow-up 11/26/2019   Cellulitis of left hand 09/26/2019   Left-sided weakness 08/08/2019   BMI 35.0-35.9,adult 08/08/2019   Acute right hip pain 06/17/2019   Inability to ambulate due to hip 06/17/2019   CKD stage 3 due to type 2 diabetes mellitus (HCC)    Hypervolemia    Acute on chronic diastolic CHF (congestive heart failure) ([place])    Type 2 diabetes mellitus with stage 3b chronic kidney disease, with long-term current use of insulin ([place])    Hypomagnesemia    Full code status 05/20/2019   Pleuritic chest pain    CAP (community acquired pneumonia) 05/19/2019   Hemoptysis 04/09/2019   Shortness of breath 03/26/2019   Uncontrolled type 2 diabetes mellitus with insulin therapy ([place])  03/12/2019   Acute on chronic heart failure with preserved ejection fraction (HFpEF) (HCC)    Chronic kidney disease, stage 3b    Chronic systolic CHF (congestive heart failure) ([place]) 92/42/6834   Diastolic dysfunction 19/62/2297   Iron deficiency anemia 12/31/2018   Atopic dermatitis 11/24/2018   Screening for colon cancer 11/06/2017   Uncontrolled type 2 diabetes mellitus with hyperglycemia  ([place]) 11/06/2017   Dermatitis, unspecified 06/09/2017   Atherosclerotic heart disease of native coronary artery without angina pectoris 06/09/2017   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   Obstructive sleep apnea, adult 06/09/2017   Morbid (severe) obesity due to excess calories ([place]) 06/09/2017   Cigarette nicotine dependence with nicotine-induced disorder 06/09/2017   Diabetic polyneuropathy associated with type 2 diabetes mellitus ([place]) 06/09/2017   Allergic rhinitis due to pollen 06/09/2017   Mixed hyperlipidemia 06/09/2017   COPD (chronic obstructive pulmonary disease) ([place]) 06/09/2017   Dyspnea on exertion 06/09/2017   Wheezing 06/09/2017   Dysuria 06/09/2017   Snoring 06/09/2017   Essential hypertension 06/09/2017   Personal history of other malignant neoplasm of kidney 06/09/2017   Pain in right hip 06/09/2017   Impacted cerumen, bilateral 06/09/2017   Secondary hyperparathyroidism, not elsewhere classified ([place]) 06/09/2017   Type 2 diabetes mellitus with hyperglycemia ([place]) 06/09/2017   Tinea corporis 06/09/2017   PCP:  [name], NP Pharmacy:   [facility] [place] [facility] [place] [place] [address] Phone: [phone] Fax: [phone]     Social Determinants of Health (SDOH) Interventions    Readmission Risk Interventions Readmission Risk Prevention Plan 05/10/2019 04/27/2019  Transportation Screening Complete Complete  Medication Review Press photographer) Complete Complete  PCP or Specialist appointment within 3-5 days of discharge - Complete  HRI or [place] - Complete  SW Recovery Care/Counseling Consult - Not Complete  SW Consult Not Complete Comments - not needed  Palliative Care Screening Not Applicable Not Applicable  Skilled Nursing Facility Not Applicable Not Applicable  Some recent data might be hidden

## 2020-01-13 ENCOUNTER — Ambulatory Visit: Payer: Medicare PPO | Admitting: Internal Medicine

## 2020-01-17 ENCOUNTER — Other Ambulatory Visit: Payer: Self-pay

## 2020-01-17 MED ORDER — GEMFIBROZIL 600 MG PO TABS: 600.0000 mg | ORAL_TABLET | Freq: Two times a day (BID) | ORAL | 1 refills | Status: DC

## 2020-01-22 ENCOUNTER — Emergency Department
Admission: EM | Admit: 2020-01-22 | Discharge: 2020-01-22 | Discharge status: Against Medical Advice | Payer: Medicare PPO

## 2020-01-22 ENCOUNTER — Telehealth: Payer: Self-pay

## 2020-01-22 DIAGNOSIS — R069 Unspecified abnormalities of breathing: Secondary | ICD-10-CM | POA: Diagnosis not present

## 2020-01-22 DIAGNOSIS — R0602 Shortness of breath: Secondary | ICD-10-CM | POA: Diagnosis not present

## 2020-01-22 NOTE — Telephone Encounter (Signed)
Ok. I would suggest he go to ER. Thanks.

## 2020-01-24 ENCOUNTER — Emergency Department: Payer: Medicare PPO

## 2020-01-24 ENCOUNTER — Emergency Department
Admission: EM | Admit: 2020-01-24 | Discharge: 2020-01-24 | Disposition: A | Discharge status: Home / Self Care | Payer: Medicare PPO | Attending: Emergency Medicine | Admitting: Emergency Medicine

## 2020-01-24 ENCOUNTER — Other Ambulatory Visit: Payer: Self-pay

## 2020-01-24 ENCOUNTER — Encounter: Payer: Self-pay | Admitting: Emergency Medicine

## 2020-01-24 DIAGNOSIS — J449 Chronic obstructive pulmonary disease, unspecified: Secondary | ICD-10-CM | POA: Insufficient documentation

## 2020-01-24 DIAGNOSIS — N1832 Chronic kidney disease, stage 3b: Secondary | ICD-10-CM | POA: Diagnosis not present

## 2020-01-24 DIAGNOSIS — R072 Precordial pain: Secondary | ICD-10-CM | POA: Diagnosis not present

## 2020-01-24 DIAGNOSIS — Z7951 Long term (current) use of inhaled steroids: Secondary | ICD-10-CM | POA: Diagnosis not present

## 2020-01-24 DIAGNOSIS — Z794 Long term (current) use of insulin: Secondary | ICD-10-CM | POA: Diagnosis not present

## 2020-01-24 DIAGNOSIS — R609 Edema, unspecified: Secondary | ICD-10-CM | POA: Diagnosis not present

## 2020-01-24 DIAGNOSIS — I2510 Atherosclerotic heart disease of native coronary artery without angina pectoris: Secondary | ICD-10-CM | POA: Diagnosis not present

## 2020-01-24 DIAGNOSIS — I5033 Acute on chronic diastolic (congestive) heart failure: Secondary | ICD-10-CM | POA: Insufficient documentation

## 2020-01-24 DIAGNOSIS — R0602 Shortness of breath: Secondary | ICD-10-CM | POA: Diagnosis not present

## 2020-01-24 DIAGNOSIS — R0789 Other chest pain: Secondary | ICD-10-CM | POA: Diagnosis not present

## 2020-01-24 DIAGNOSIS — Z87891 Personal history of nicotine dependence: Secondary | ICD-10-CM | POA: Insufficient documentation

## 2020-01-24 DIAGNOSIS — R05 Cough: Secondary | ICD-10-CM | POA: Insufficient documentation

## 2020-01-24 DIAGNOSIS — I700 Atherosclerosis of aorta: Secondary | ICD-10-CM | POA: Diagnosis not present

## 2020-01-24 DIAGNOSIS — R069 Unspecified abnormalities of breathing: Secondary | ICD-10-CM | POA: Diagnosis not present

## 2020-01-24 DIAGNOSIS — Z79899 Other long term (current) drug therapy: Secondary | ICD-10-CM | POA: Insufficient documentation

## 2020-01-24 DIAGNOSIS — K920 Hematemesis: Secondary | ICD-10-CM | POA: Diagnosis not present

## 2020-01-24 DIAGNOSIS — E1140 Type 2 diabetes mellitus with diabetic neuropathy, unspecified: Secondary | ICD-10-CM | POA: Insufficient documentation

## 2020-01-24 DIAGNOSIS — I130 Hypertensive heart and chronic kidney disease with heart failure and stage 1 through stage 4 chronic kidney disease, or unspecified chronic kidney disease: Secondary | ICD-10-CM | POA: Diagnosis not present

## 2020-01-24 DIAGNOSIS — I509 Heart failure, unspecified: Secondary | ICD-10-CM | POA: Diagnosis not present

## 2020-01-24 DIAGNOSIS — R079 Chest pain, unspecified: Secondary | ICD-10-CM | POA: Diagnosis not present

## 2020-01-24 LAB — TROPONIN I (HIGH SENSITIVITY)
Troponin I (High Sensitivity): 9 ng/L (ref <18)
Troponin I (High Sensitivity): 11 ng/L (ref <18)

## 2020-01-24 LAB — BASIC METABOLIC PANEL
Anion gap: 11 (ref 5–15)
BUN: 53 mg/dL — ABNORMAL HIGH (ref 8–23)
CO2: 22 mmol/L (ref 22–32)
Calcium: 8.8 mg/dL — ABNORMAL LOW (ref 8.9–10.3)
Chloride: 104 mmol/L (ref 98–111)
Creatinine, Ser: 2.05 mg/dL — ABNORMAL HIGH (ref 0.61–1.24)
GFR calc Af Amer: 38 mL/min — ABNORMAL LOW (ref >60)
GFR calc non Af Amer: 33 mL/min — ABNORMAL LOW (ref >60)
Glucose, Bld: 109 mg/dL — ABNORMAL HIGH (ref 70–99)
Potassium: 3.6 mmol/L (ref 3.5–5.1)
Sodium: 137 mmol/L (ref 135–145)

## 2020-01-24 LAB — CBC
HCT: 34.8 % — ABNORMAL LOW (ref 39.0–52.0)
Hemoglobin: 11.3 g/dL — ABNORMAL LOW (ref 13.0–17.0)
MCH: 29.1 pg (ref 26.0–34.0)
MCHC: 32.5 g/dL (ref 30.0–36.0)
MCV: 89.7 fL (ref 80.0–100.0)
Platelets: 176 10*3/uL (ref 150–400)
RBC: 3.88 MIL/uL — ABNORMAL LOW (ref 4.22–5.81)
RDW: 13.5 % (ref 11.5–15.5)
WBC: 4.8 10*3/uL (ref 4.0–10.5)
nRBC: 0 % (ref 0.0–0.2)

## 2020-01-24 LAB — BRAIN NATRIURETIC PEPTIDE: B Natriuretic Peptide: 94.5 pg/mL (ref 0.0–100.0)

## 2020-01-24 MED ORDER — LACTATED RINGERS IV BOLUS
500.0000 mL | Freq: Once | INTRAVENOUS | Status: AC
  Administered 2020-01-24: 500 mL via INTRAVENOUS

## 2020-01-24 MED ORDER — ALUM & MAG HYDROXIDE-SIMETH 200-200-20 MG/5ML PO SUSP
30.0000 mL | Freq: Once | ORAL | Status: AC
  Administered 2020-01-24: 30 mL via ORAL
  Filled 2020-01-24: qty 30

## 2020-01-24 MED ORDER — IOHEXOL 350 MG/ML SOLN
60.0000 mL | Freq: Once | INTRAVENOUS | Status: AC | PRN
  Administered 2020-01-24: 60 mL via INTRAVENOUS

## 2020-01-24 MED ORDER — PANTOPRAZOLE SODIUM 40 MG IV SOLR
40.0000 mg | Freq: Once | INTRAVENOUS | Status: AC
  Administered 2020-01-24: 40 mg via INTRAVENOUS
  Filled 2020-01-24: qty 40

## 2020-01-24 NOTE — ED Triage Notes (Signed)
Here for chest pain and cough.  Reports coughing up "fluid".  Unlabored at this time, low grade temp present.  Pt is chf pt and reports has not been able to weigh himself.

## 2020-01-24 NOTE — ED Provider Notes (Signed)
[facility] Emergency Department Provider Note  ____________________________________________   First MD Initiated Contact with Patient 01/24/20 2034     (approximate)  I have reviewed the triage vital signs and the nursing notes.   HISTORY  Chief Complaint Chest Pain   HPI Larry Callahan is a 64 y.o. male with medical history significant for hypertension, type 2 diabetes mellitus, chronic systolic CHF, chronic kidney disease stage IIIb, and COPD who presents for assessment of multiple complaints including 1 day of sharp stabbing burning substernal pain that does not radiate and is not alleviated by position or exertional factors.  Patient states he has had the symptoms before but is slightly worse today.  He endorses several months of spitting up blood he states "they put a camera down in my throat but were not able to tell me where was coming from".  He states this is not any different today than usual.  He endorses a chronic cough and chronic shortness of breath on exertion but states these are not any different today than usual.  He denies any blood in his stool or dark tarry stools.  Denies any headache, lightheadedness, dizziness, abdominal pain, vomiting, diarrhea, dysuria, rash, recent falls or injuries, or other acute complaints.         Past Medical History:  Diagnosis Date  . Allergy   . Coronary artery disease   . Diabetes mellitus without complication ([place])   . Diastolic CHF ([place])   . GERD (gastroesophageal reflux disease)   . Hyperlipidemia   . Hypertension   . Renal insufficiency   . Sleep apnea     Patient Active Problem List   Diagnosis Date Noted  . Hematemesis 01/09/2020  . Hospital discharge follow-up 11/26/2019  . Cellulitis of left hand 09/26/2019  . Left-sided weakness 08/08/2019  . BMI 35.0-35.9,adult 08/08/2019  . Acute right hip pain 06/17/2019  . Inability to ambulate due to hip 06/17/2019  . CKD stage 3 due to  type 2 diabetes mellitus ([place])   . Hypervolemia   . Acute on chronic diastolic CHF (congestive heart failure) ([place])   . Type 2 diabetes mellitus with stage 3b chronic kidney disease, with long-term current use of insulin ([place])   . Hypomagnesemia   . Full code status 05/20/2019  . Pleuritic chest pain   . CAP (community acquired pneumonia) 05/19/2019  . Hemoptysis 04/09/2019  . Shortness of breath 03/26/2019  . Uncontrolled type 2 diabetes mellitus with insulin therapy ([place]) 03/12/2019  . Acute on chronic heart failure with preserved ejection fraction (HFpEF) ([place])   . Chronic kidney disease, stage 3b   . Chronic systolic CHF (congestive heart failure) ([place]) 03/01/2019  . Diastolic dysfunction 37/29/0211  . Iron deficiency anemia 12/31/2018  . Atopic dermatitis 11/24/2018  . Screening for colon cancer 11/06/2017  . Uncontrolled type 2 diabetes mellitus with hyperglycemia ([place]) 11/06/2017  . Dermatitis, unspecified 06/09/2017  . Atherosclerotic heart disease of native coronary artery without angina pectoris 06/09/2017  . 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 Obstructive sleep apnea, adult 06/09/2017  . Morbid (severe) obesity due to excess calories ([place]) 06/09/2017  . Cigarette nicotine dependence with nicotine-induced disorder 06/09/2017  . Diabetic polyneuropathy associated with type 2 diabetes mellitus ([place]) 06/09/2017  . Allergic rhinitis due to pollen 06/09/2017  . Mixed hyperlipidemia 06/09/2017  . COPD (chronic obstructive pulmonary disease) ([place]) 06/09/2017  . Dyspnea on exertion 06/09/2017  . Wheezing 06/09/2017  . Dysuria 06/09/2017  .  Snoring 06/09/2017  . Essential hypertension 06/09/2017  . Personal history of other malignant neoplasm of kidney 06/09/2017  . Pain in right hip 06/09/2017  . Impacted cerumen, bilateral 06/09/2017  . Secondary hyperparathyroidism, not elsewhere classified ([place]) 06/09/2017  . Type 2 diabetes mellitus  with hyperglycemia ([place]) 06/09/2017  . Tinea corporis 06/09/2017    Past Surgical History:  Procedure Laterality Date  . BACK SURGERY    . CARDIAC CATHETERIZATION    . CHOLECYSTECTOMY    . COLONOSCOPY WITH PROPOFOL N/A 04/12/2019   Procedure: COLONOSCOPY WITH PROPOFOL;  Surgeon: [name], MD;  Location: [facility] ENDOSCOPY;  Service: Gastroenterology;  Laterality: N/A;  . ESOPHAGOGASTRODUODENOSCOPY N/A 04/12/2019   Procedure: ESOPHAGOGASTRODUODENOSCOPY (EGD);  Surgeon: [name], MD;  Location: [facility] ENDOSCOPY;  Service: Gastroenterology;  Laterality: N/A;  . ESOPHAGOGASTRODUODENOSCOPY (EGD) WITH PROPOFOL N/A 11/21/2019   Procedure: ESOPHAGOGASTRODUODENOSCOPY (EGD) WITH PROPOFOL;  Surgeon: [name], MD;  Location: [facility] ENDOSCOPY;  Service: Gastroenterology;  Laterality: N/A;  . FLEXIBLE BRONCHOSCOPY Bilateral 05/17/2019   Procedure: FLEXIBLE BRONCHOSCOPY;  Surgeon: [name], MD;  Location: [name] ORS;  Service: Pulmonary;  Laterality: Bilateral;  . left arm surgery    . nephrectomy Left   . PARATHYROIDECTOMY    . RIGHT HEART CATH N/A 03/29/2019   Procedure: RIGHT HEART CATH;  Surgeon: [name], MD;  Location: [facility] CV LAB;  Service: Cardiovascular;  Laterality: N/A;    Prior to Admission medications   Medication Sig Start Date End Date Taking? Authorizing Provider  acetaminophen (TYLENOL) 325 MG tablet Take 2 tablets (650 mg total) by mouth every 4 (four) hours as needed for headache or mild pain. 03/05/19   Gouru, Illene Silver, MD  albuterol (VENTOLIN HFA) 108 (90 Base) MCG/ACT inhaler Inhale 2 puffs into the lungs every 6 (six) hours as needed for wheezing or shortness of breath. 03/05/19   Gouru, Illene Silver, MD  amLODipine (NORVASC) 10 MG tablet Take 1 tablet (10 mg total) by mouth daily. 08/01/19   [name], NP  benzonatate (TESSALON PERLES) 100 MG capsule Take 1 capsule (100 mg total) by mouth 3 (three) times daily as needed for cough. 12/10/19  12/09/20  Carrie Mew, MD  Blood Glucose Calibration (TRUE METRIX LEVEL 1) Low SOLN Use as directed 04/04/18   [name], NP  Blood Glucose Monitoring Suppl (TRUE METRIX AIR GLUCOSE METER) DEVI 1 Device by Does not apply route 2 (two) times daily. 04/13/18   [name], NP  ferrous gluconate (FERGON) 324 MG tablet Take 1 tablet (324 mg total) by mouth daily with breakfast. 08/01/19   [name], NP  furosemide (LASIX) 80 MG tablet Hold until outpatient PCP followup due to acute kidney injury. 11/21/19   Enzo Bi, MD  gabapentin (NEURONTIN) 100 MG capsule Take 1 capsule (100 mg total) by mouth in the morning, at noon, and at bedtime. 11/26/19   [name], NP  gemfibrozil (LOPID) 600 MG tablet Take 1 tablet (600 mg total) by mouth 2 (two) times daily with a meal. 01/17/20   Boscia, Heather E, NP  glimepiride (AMARYL) 2 MG tablet Take 1 tablet (2 mg total) by mouth at bedtime. 08/02/19   Lavera Guise, MD  glucose blood (TRUE METRIX BLOOD GLUCOSE TEST) test strip Use as instructed twice daily diag E11.65 07/30/19   [name], NP  hydrALAZINE (APRESOLINE) 100 MG tablet Take 1 tablet (100 mg total) by mouth 3 (three) times daily. 01/06/20   Boscia,  Greer Ee, NP  hydrOXYzine (ATARAX) 10 MG/5ML syrup Take 5 mLs (10 mg total) by mouth 3 (three) times daily as needed for itching. 09/24/19   Sable Feil, PA-C  insulin glargine, 1 Unit Dial, (TOUJEO) 300 UNIT/ML Solostar Pen Inject 26 Units into the skin daily. 10/24/19   [name], NP  ipratropium-albuterol (DUONEB) 0.5-2.5 (3) MG/3ML SOLN Take 3 mLs by nebulization every 6 (six) hours as needed. 03/08/19   Kendell Bane, NP  isosorbide mononitrate (IMDUR) 30 MG 24 hr tablet Take 1 tablet (30 mg total) by mouth daily. 12/03/19   [name], MD  loratadine (CLARITIN) 10 MG tablet Take 1 tablet (10 mg total) by mouth daily as needed for allergies. 11/21/19   Enzo Bi, MD  metoprolol succinate (TOPROL-XL) 50 MG  24 hr tablet Take 1 tablet (50 mg total) by mouth at bedtime. Take with or immediately following a meal. 07/10/19   Lavera Guise, MD  nitroGLYCERIN (NITROSTAT) 0.4 MG SL tablet Place 1 tablet (0.4 mg total) under the tongue every 5 (five) minutes x 3 doses as needed for chest pain. 05/24/19   Loletha Grayer, MD  NOVOFINE 32G X 6 MM MISC To use with [place] injections daily 12/13/18   [name], NP  omeprazole (PRILOSEC) 40 MG capsule Take 1 capsule (40 mg total) by mouth in the morning and at bedtime. 01/10/20 02/09/20  Wyvonnia Dusky, MD  potassium chloride SA (KLOR-CON) 20 MEQ tablet Take 1 tablet (20 mEq total) by mouth 2 (two) times daily. 11/26/19   [name], NP  predniSONE (DELTASONE) 50 MG tablet Take 1 tablet (50 mg total) by mouth daily with breakfast. 01/08/20   Lavonia Drafts, MD  rosuvastatin (CRESTOR) 10 MG tablet Take 1 tablet (10 mg total) by mouth daily. 08/23/19 11/21/19  [name], MD  sucralfate (CARAFATE) 1 g tablet Take 1 tablet (1 g total) by mouth 4 (four) times daily -  with meals and at bedtime. 01/10/20 02/09/20  Wyvonnia Dusky, MD  tiotropium (SPIRIVA HANDIHALER) 18 MCG inhalation capsule Place 1 capsule (18 mcg total) into inhaler and inhale daily. 08/07/19 11/19/19  [name], NP  TRUEplus Lancets 33G MISC Use as directed twice daily diag E11.65 07/30/19   [name], NP  vitamin B-12 (CYANOCOBALAMIN) 1000 MCG tablet Take 1 tablet (1,000 mcg total) by mouth daily. 11/21/19   Enzo Bi, MD    Allergies Penicillins  Family History  Problem Relation Age of Onset  . Diabetes Mother   . Lung cancer Father   . Diabetes Sister   . Hypertension Sister   . Heart disease Sister   . Cancer Sister   . Bone cancer Brother     Social History Social History   Tobacco Use  . Smoking status: Former Smoker    Types: Cigarettes  . Smokeless tobacco: Former Systems developer    Types: Secondary school teacher  . Vaping Use: Never used  Substance Use Topics  .  Alcohol use: No  . Drug use: No    Review of Systems  Review of Systems  Constitutional: Negative for chills and fever.  HENT: Negative for sore throat.   Eyes: Negative for pain.  Respiratory: Positive for cough, sputum production and shortness of breath ( chronic w/ exertion ). Negative for stridor.   Cardiovascular: Positive for chest pain.  Gastrointestinal: Positive for vomiting ( intermittent over last several months  with large clots ).  Skin:  Negative for rash.  Neurological: Negative for seizures, loss of consciousness and headaches.  Psychiatric/Behavioral: Negative for suicidal ideas.  All other systems reviewed and are negative.     ____________________________________________   PHYSICAL EXAM:  VITAL SIGNS: ED Triage Vitals  Enc Vitals Group     BP 01/24/20 1428 140/77     Pulse Rate 01/24/20 1428 78     Resp 01/24/20 1428 (!) 22     Temp 01/24/20 1428 100.2 F (37.9 C)     Temp Source 01/24/20 1428 Oral     SpO2 01/24/20 1428 94 %     Weight 01/24/20 1434 262 lb (118.8 kg)     Height 01/24/20 1427 _0  (1.803 m)     Head Circumference --      Peak Flow --      Pain Score --      Pain Loc --      Pain Edu? --      Excl. in [place]? --    Vitals:   01/24/20 2230 01/24/20 2236  BP: (!) 154/88   Pulse: 89 63  Resp: (!) 23 18  Temp:    SpO2: 94% 97%   Physical Exam Vitals and nursing note reviewed.  Constitutional:      Appearance: He is well-developed. He is obese.  HENT:     Head: Normocephalic and atraumatic.     Right Ear: External ear normal.     Left Ear: External ear normal.     Nose: Nose normal.     Mouth/Throat:     Mouth: Mucous membranes are moist.  Eyes:     Conjunctiva/sclera: Conjunctivae normal.  Cardiovascular:     Rate and Rhythm: Normal rate and regular rhythm.     Heart sounds: No murmur heard.   Pulmonary:     Effort: Pulmonary effort is normal. No respiratory distress.     Breath sounds: Normal breath sounds.    Abdominal:     Palpations: Abdomen is soft.     Tenderness: There is no abdominal tenderness.  Musculoskeletal:     Cervical back: Neck supple.  Skin:    General: Skin is warm and dry.     Capillary Refill: Capillary refill takes less than 2 seconds.  Neurological:     General: No focal deficit present.     Mental Status: He is alert.  Psychiatric:        Mood and Affect: Mood normal.      ____________________________________________   LABS (all labs ordered are listed, but only abnormal results are displayed)  Labs Reviewed  BASIC METABOLIC PANEL - Abnormal; Notable for the following components:      Result Value   Glucose, Bld 109 (*)    BUN 53 (*)    Creatinine, Ser 2.05 (*)    Calcium 8.8 (*)    GFR calc non Af Amer 33 (*)    GFR calc Af Amer 38 (*)    All other components within normal limits  CBC - Abnormal; Notable for the following components:   RBC 3.88 (*)    Hemoglobin 11.3 (*)    HCT 34.8 (*)    All other components within normal limits  BRAIN NATRIURETIC PEPTIDE  TROPONIN I (HIGH SENSITIVITY)  TROPONIN I (HIGH SENSITIVITY)   ____________________________________________  EKG  Normal sinus rhythm with a rate of 79, normal axis, unremarkable intervals, no evidence of acute ischemia or other significant underlying arrhythmia. ____________________________________________  RADIOLOGY   Official radiology report(s):  DG Chest 2 View  Result Date: 01/24/2020 CLINICAL DATA:  Here for chest pain and cough, burning in chest. Reports coughing up "fluid". Unlabored at this time, low grade temp present. Pt is chf pt EXAM: CHEST - 2 VIEW COMPARISON:  01/08/2020 FINDINGS: Cardiac silhouette top-normal in size. No mediastinal or hilar masses. No evidence of adenopathy. Prominent vascular markings similar to the prior study. Lungs otherwise clear with no evidence of pulmonary edema. No pleural effusion or pneumothorax. Skeletal structures are grossly intact.  IMPRESSION: No active cardiopulmonary disease. Electronically Signed   By: Lajean Manes M.D.   On: 01/24/2020 15:01   CT Angio Chest PE W and/or Wo Contrast  Result Date: 01/24/2020 CLINICAL DATA:  Shortness of breath, chest pain EXAM: CT ANGIOGRAPHY CHEST WITH CONTRAST TECHNIQUE: Multidetector CT imaging of the chest was performed using the standard protocol during bolus administration of intravenous contrast. Multiplanar CT image reconstructions and MIPs were obtained to evaluate the vascular anatomy. CONTRAST:  62m OMNIPAQUE IOHEXOL 350 MG/ML SOLN COMPARISON:  11/19/2019 FINDINGS: Cardiovascular: No filling defects in the pulmonary arteries to suggest pulmonary emboli. Heart is normal size. Densely calcified areas within the left anterior descending coronary artery. Aortic calcifications. No aneurysm. Mild tortuosity. Mediastinum/Nodes: No mediastinal, hilar, or axillary adenopathy. Trachea and esophagus are unremarkable. Thyroid unremarkable. Lungs/Pleura: No confluent opacities or effusions. Upper Abdomen: Calcifications in the spleen compatible with old granulomatous disease. Musculoskeletal: Chest wall soft tissues are unremarkable. No acute bony abnormality. Review of the MIP images confirms the above findings. IMPRESSION: No evidence of pulmonary embolus. Coronary artery disease. No acute findings. Aortic Atherosclerosis (ICD10-I70.0). Electronically Signed   By: KRolm BaptiseM.D.   On: 01/24/2020 22:28    ____________________________________________   PROCEDURES  Procedure(s) performed (including Critical Care):  Procedures   ____________________________________________   INITIAL IMPRESSION / ASSESSMENT AND PLAN / ED COURSE        Patient presents with above-stated history exam for assessment chest pain as described above as well as chronic intermittent hematemesis.  Patient is afebrile and slightly hypertensive otherwise stable vital signs on room air on arrival.  Exam as above.   Prior records reviewed I did confirm the patient has presented to the hospital in the past with this most recently last month and has had an EGD in June of this year that was unremarkable.  With regard to patient's chest pain low suspicion for ACS given reassuring EKG and 2 troponins obtained over 2 hours that are nonelevated.  CTA chest obtained shows no evidence of pneumothorax, PE, pneumonia, fracture, or other acute thoracic process.  Patient's hemoglobin is stable slightly elevated from that obtained 2 weeks ago.  He has no significant electrolyte or metabolic derangements and his abdomen is soft and nontender throughout.  Overall unclear presentation for patient's recurrent hematemesis although possible Mallory-Weiss tear versus gastritis are both within differential.  Given chronicity of symptoms with stable vital signs, stable hemoglobin, and otherwise reassuring work-up I do believe patient stable for discharge with plan for close outpatient follow-up.  Patient was given Maalox and Protonix as well as IV hydration given his history of CKD and contrast CT study obtained today.  Encourage patient to continue taking his Protonix and follow-up with his PCP and gastroenterologist early next week.  Discharged stable condition.  Strict return precautions advised and discussed.          ____________________________________________   FINAL CLINICAL IMPRESSION(S) / ED DIAGNOSES  Final diagnoses:  Hematemesis, presence of nausea not specified  ED Discharge Orders    None       Note:  This document was prepared using Dragon voice recognition software and may include unintentional dictation errors.   Lucrezia Starch, MD 01/24/20 [phone]

## 2020-01-24 NOTE — ED Notes (Signed)
Patient given update on wait time. Patient verbalizes understanding.  

## 2020-01-24 NOTE — ED Notes (Addendum)
First Nurse Note: Pt to ED via EMS c/o ShOB and CP. Pt pulled for EKG

## 2020-01-24 NOTE — ED Notes (Signed)
Pt discharged to lobby to try to call a ride or cab.

## 2020-01-24 NOTE — ED Notes (Signed)
Signature pad not working.  Patient has no further questions about discharge instructions at this time.

## 2020-01-24 NOTE — ED Notes (Signed)
Pt c/o increased shortness of breath. Pt SpO2 level checked at 98%. Pt is in NAD, able to speak in complete sentences. Respirations are equal and unlabored.

## 2020-01-26 ENCOUNTER — Emergency Department: Payer: Medicare PPO

## 2020-01-26 DIAGNOSIS — Z5321 Procedure and treatment not carried out due to patient leaving prior to being seen by health care provider: Secondary | ICD-10-CM | POA: Insufficient documentation

## 2020-01-26 DIAGNOSIS — R5381 Other malaise: Secondary | ICD-10-CM | POA: Diagnosis not present

## 2020-01-26 DIAGNOSIS — U071 COVID-19: Secondary | ICD-10-CM | POA: Diagnosis not present

## 2020-01-26 DIAGNOSIS — R531 Weakness: Secondary | ICD-10-CM | POA: Insufficient documentation

## 2020-01-26 LAB — CBC
HCT: 35.6 % — ABNORMAL LOW (ref 39.0–52.0)
Hemoglobin: 11.6 g/dL — ABNORMAL LOW (ref 13.0–17.0)
MCH: 29.1 pg (ref 26.0–34.0)
MCHC: 32.6 g/dL (ref 30.0–36.0)
MCV: 89.2 fL (ref 80.0–100.0)
Platelets: 158 10*3/uL (ref 150–400)
RBC: 3.99 MIL/uL — ABNORMAL LOW (ref 4.22–5.81)
RDW: 13.7 % (ref 11.5–15.5)
WBC: 4.4 10*3/uL (ref 4.0–10.5)
nRBC: 0 % (ref 0.0–0.2)

## 2020-01-26 LAB — COMPREHENSIVE METABOLIC PANEL
ALT: 18 U/L (ref 0–44)
AST: 27 U/L (ref 15–41)
Albumin: 3.5 g/dL (ref 3.5–5.0)
Alkaline Phosphatase: 74 U/L (ref 38–126)
Anion gap: 12 (ref 5–15)
BUN: 45 mg/dL — ABNORMAL HIGH (ref 8–23)
CO2: 21 mmol/L — ABNORMAL LOW (ref 22–32)
Calcium: 8.3 mg/dL — ABNORMAL LOW (ref 8.9–10.3)
Chloride: 104 mmol/L (ref 98–111)
Creatinine, Ser: 2.28 mg/dL — ABNORMAL HIGH (ref 0.61–1.24)
GFR calc Af Amer: 33 mL/min — ABNORMAL LOW (ref >60)
GFR calc non Af Amer: 29 mL/min — ABNORMAL LOW (ref >60)
Glucose, Bld: 105 mg/dL — ABNORMAL HIGH (ref 70–99)
Potassium: 3.5 mmol/L (ref 3.5–5.1)
Sodium: 137 mmol/L (ref 135–145)
Total Bilirubin: 0.7 mg/dL (ref 0.3–1.2)
Total Protein: 7.3 g/dL (ref 6.5–8.1)

## 2020-01-26 LAB — TROPONIN I (HIGH SENSITIVITY): Troponin I (High Sensitivity): 20 ng/L — ABNORMAL HIGH (ref <18)

## 2020-01-26 NOTE — ED Notes (Signed)
Temperature rechecked at 99.8 oral

## 2020-01-26 NOTE — ED Triage Notes (Signed)
Patient to ED via EMS from home. States his sister called [facility] and told them he was around the virus. Patient keeps talking about talking to [facility] about him laying in the bed. States he has felt weak since leaving her on Saturday morning at 0145. Patient is mad and wants to tell the man whats going on over here. He is tired of people sending him for tests and he can't eat anything.

## 2020-01-27 ENCOUNTER — Emergency Department
Admission: EM | Admit: 2020-01-27 | Discharge: 2020-01-27 | Disposition: A | Discharge status: Home / Self Care | Payer: Medicare PPO | Source: Home / Self Care

## 2020-01-27 LAB — TROPONIN I (HIGH SENSITIVITY): Troponin I (High Sensitivity): 22 ng/L — ABNORMAL HIGH (ref <18)

## 2020-01-29 ENCOUNTER — Inpatient Hospital Stay
Admission: EM | Admit: 2020-01-29 | Discharge: 2020-02-03 | DRG: 177 | Disposition: A | Discharge status: Home / Self Care | Payer: Medicare PPO | Attending: Family Medicine | Admitting: Family Medicine

## 2020-01-29 ENCOUNTER — Other Ambulatory Visit: Payer: Self-pay

## 2020-01-29 ENCOUNTER — Emergency Department: Payer: Medicare PPO

## 2020-01-29 DIAGNOSIS — Z79899 Other long term (current) drug therapy: Secondary | ICD-10-CM | POA: Diagnosis not present

## 2020-01-29 DIAGNOSIS — J189 Pneumonia, unspecified organism: Secondary | ICD-10-CM

## 2020-01-29 DIAGNOSIS — R42 Dizziness and giddiness: Secondary | ICD-10-CM | POA: Diagnosis not present

## 2020-01-29 DIAGNOSIS — N1832 Chronic kidney disease, stage 3b: Secondary | ICD-10-CM | POA: Diagnosis present

## 2020-01-29 DIAGNOSIS — K219 Gastro-esophageal reflux disease without esophagitis: Secondary | ICD-10-CM | POA: Diagnosis present

## 2020-01-29 DIAGNOSIS — G4733 Obstructive sleep apnea (adult) (pediatric): Secondary | ICD-10-CM | POA: Diagnosis present

## 2020-01-29 DIAGNOSIS — Z833 Family history of diabetes mellitus: Secondary | ICD-10-CM

## 2020-01-29 DIAGNOSIS — J440 Chronic obstructive pulmonary disease with acute lower respiratory infection: Secondary | ICD-10-CM | POA: Diagnosis not present

## 2020-01-29 DIAGNOSIS — E1142 Type 2 diabetes mellitus with diabetic polyneuropathy: Secondary | ICD-10-CM | POA: Diagnosis present

## 2020-01-29 DIAGNOSIS — Z6835 Body mass index (BMI) 35.0-35.9, adult: Secondary | ICD-10-CM | POA: Diagnosis not present

## 2020-01-29 DIAGNOSIS — N189 Chronic kidney disease, unspecified: Secondary | ICD-10-CM | POA: Diagnosis not present

## 2020-01-29 DIAGNOSIS — J449 Chronic obstructive pulmonary disease, unspecified: Secondary | ICD-10-CM | POA: Diagnosis present

## 2020-01-29 DIAGNOSIS — R531 Weakness: Secondary | ICD-10-CM | POA: Diagnosis not present

## 2020-01-29 DIAGNOSIS — E1122 Type 2 diabetes mellitus with diabetic chronic kidney disease: Secondary | ICD-10-CM | POA: Diagnosis present

## 2020-01-29 DIAGNOSIS — Z9049 Acquired absence of other specified parts of digestive tract: Secondary | ICD-10-CM | POA: Diagnosis not present

## 2020-01-29 DIAGNOSIS — Z801 Family history of malignant neoplasm of trachea, bronchus and lung: Secondary | ICD-10-CM

## 2020-01-29 DIAGNOSIS — E872 Acidosis: Secondary | ICD-10-CM | POA: Diagnosis present

## 2020-01-29 DIAGNOSIS — E785 Hyperlipidemia, unspecified: Secondary | ICD-10-CM | POA: Diagnosis present

## 2020-01-29 DIAGNOSIS — I5022 Chronic systolic (congestive) heart failure: Secondary | ICD-10-CM | POA: Diagnosis not present

## 2020-01-29 DIAGNOSIS — Z8249 Family history of ischemic heart disease and other diseases of the circulatory system: Secondary | ICD-10-CM

## 2020-01-29 DIAGNOSIS — Z87891 Personal history of nicotine dependence: Secondary | ICD-10-CM

## 2020-01-29 DIAGNOSIS — R55 Syncope and collapse: Secondary | ICD-10-CM | POA: Diagnosis not present

## 2020-01-29 DIAGNOSIS — N179 Acute kidney failure, unspecified: Secondary | ICD-10-CM | POA: Diagnosis not present

## 2020-01-29 DIAGNOSIS — Z20822 Contact with and (suspected) exposure to covid-19: Secondary | ICD-10-CM

## 2020-01-29 DIAGNOSIS — Z880 Allergy status to penicillin: Secondary | ICD-10-CM

## 2020-01-29 DIAGNOSIS — Z794 Long term (current) use of insulin: Secondary | ICD-10-CM | POA: Diagnosis not present

## 2020-01-29 DIAGNOSIS — I2510 Atherosclerotic heart disease of native coronary artery without angina pectoris: Secondary | ICD-10-CM | POA: Diagnosis present

## 2020-01-29 DIAGNOSIS — Z7952 Long term (current) use of systemic steroids: Secondary | ICD-10-CM | POA: Diagnosis not present

## 2020-01-29 DIAGNOSIS — R7989 Other specified abnormal findings of blood chemistry: Secondary | ICD-10-CM | POA: Diagnosis present

## 2020-01-29 DIAGNOSIS — U071 COVID-19: Secondary | ICD-10-CM | POA: Diagnosis not present

## 2020-01-29 DIAGNOSIS — I130 Hypertensive heart and chronic kidney disease with heart failure and stage 1 through stage 4 chronic kidney disease, or unspecified chronic kidney disease: Secondary | ICD-10-CM | POA: Diagnosis present

## 2020-01-29 DIAGNOSIS — E1121 Type 2 diabetes mellitus with diabetic nephropathy: Secondary | ICD-10-CM | POA: Diagnosis not present

## 2020-01-29 DIAGNOSIS — J1282 Pneumonia due to coronavirus disease 2019: Secondary | ICD-10-CM | POA: Diagnosis present

## 2020-01-29 DIAGNOSIS — R778 Other specified abnormalities of plasma proteins: Secondary | ICD-10-CM

## 2020-01-29 DIAGNOSIS — N2581 Secondary hyperparathyroidism of renal origin: Secondary | ICD-10-CM | POA: Diagnosis not present

## 2020-01-29 DIAGNOSIS — Z209 Contact with and (suspected) exposure to unspecified communicable disease: Secondary | ICD-10-CM | POA: Diagnosis not present

## 2020-01-29 DIAGNOSIS — E119 Type 2 diabetes mellitus without complications: Secondary | ICD-10-CM

## 2020-01-29 LAB — BASIC METABOLIC PANEL
Anion gap: 15 (ref 5–15)
BUN: 52 mg/dL — ABNORMAL HIGH (ref 8–23)
CO2: 19 mmol/L — ABNORMAL LOW (ref 22–32)
Calcium: 8.7 mg/dL — ABNORMAL LOW (ref 8.9–10.3)
Chloride: 99 mmol/L (ref 98–111)
Creatinine, Ser: 3.04 mg/dL — ABNORMAL HIGH (ref 0.61–1.24)
GFR calc Af Amer: 24 mL/min — ABNORMAL LOW (ref >60)
GFR calc non Af Amer: 20 mL/min — ABNORMAL LOW (ref >60)
Glucose, Bld: 130 mg/dL — ABNORMAL HIGH (ref 70–99)
Potassium: 3.6 mmol/L (ref 3.5–5.1)
Sodium: 133 mmol/L — ABNORMAL LOW (ref 135–145)

## 2020-01-29 LAB — SARS CORONAVIRUS 2 BY RT PCR (HOSPITAL ORDER, PERFORMED IN ~~LOC~~ HOSPITAL LAB): SARS Coronavirus 2: POSITIVE — AB

## 2020-01-29 LAB — CBC
HCT: 38.1 % — ABNORMAL LOW (ref 39.0–52.0)
Hemoglobin: 12.9 g/dL — ABNORMAL LOW (ref 13.0–17.0)
MCH: 29.2 pg (ref 26.0–34.0)
MCHC: 33.9 g/dL (ref 30.0–36.0)
MCV: 86.2 fL (ref 80.0–100.0)
Platelets: 148 10*3/uL — ABNORMAL LOW (ref 150–400)
RBC: 4.42 MIL/uL (ref 4.22–5.81)
RDW: 13.6 % (ref 11.5–15.5)
WBC: 5.3 10*3/uL (ref 4.0–10.5)
nRBC: 0 % (ref 0.0–0.2)

## 2020-01-29 LAB — TSH: TSH: 1.738 u[IU]/mL (ref 0.350–4.500)

## 2020-01-29 LAB — TROPONIN I (HIGH SENSITIVITY): Troponin I (High Sensitivity): 44 ng/L — ABNORMAL HIGH (ref <18)

## 2020-01-29 MED ORDER — ACETAMINOPHEN 325 MG PO TABS: 650.0000 mg | ORAL_TABLET | Freq: Four times a day (QID) | ORAL | Status: DC | PRN

## 2020-01-29 MED ORDER — INSULIN ASPART 100 UNIT/ML ~~LOC~~ SOLN
0.0000 [IU] | Freq: Three times a day (TID) | SUBCUTANEOUS | Status: DC
  Administered 2020-01-30: 7 [IU] via SUBCUTANEOUS
  Administered 2020-01-30: 4 [IU] via SUBCUTANEOUS
  Administered 2020-01-30: 3 [IU] via SUBCUTANEOUS
  Administered 2020-01-31: 17:00:00 11 [IU] via SUBCUTANEOUS
  Administered 2020-01-31 (×2): 4 [IU] via SUBCUTANEOUS
  Administered 2020-02-01 (×2): 3 [IU] via SUBCUTANEOUS
  Administered 2020-02-01: 11 [IU] via SUBCUTANEOUS
  Administered 2020-02-02: 13:00:00 4 [IU] via SUBCUTANEOUS
  Administered 2020-02-02: 3 [IU] via SUBCUTANEOUS
  Administered 2020-02-02: 18:00:00 11 [IU] via SUBCUTANEOUS
  Administered 2020-02-03 (×2): 7 [IU] via SUBCUTANEOUS
  Filled 2020-01-29 (×14): qty 1

## 2020-01-29 MED ORDER — ONDANSETRON HCL 4 MG/2ML IJ SOLN: 4.0000 mg | Freq: Four times a day (QID) | INTRAMUSCULAR | Status: DC | PRN

## 2020-01-29 MED ORDER — ACETAMINOPHEN 650 MG RE SUPP: 650.0000 mg | Freq: Four times a day (QID) | RECTAL | Status: DC | PRN

## 2020-01-29 MED ORDER — INSULIN ASPART 100 UNIT/ML ~~LOC~~ SOLN
0.0000 [IU] | Freq: Every day | SUBCUTANEOUS | Status: DC
  Administered 2020-01-31: 22:00:00 3 [IU] via SUBCUTANEOUS
  Administered 2020-02-01: 21:00:00 4 [IU] via SUBCUTANEOUS
  Administered 2020-02-02: 21:00:00 3 [IU] via SUBCUTANEOUS
  Filled 2020-01-29 (×3): qty 1

## 2020-01-29 MED ORDER — SODIUM CHLORIDE 0.9 % IV SOLN: INTRAVENOUS | Status: DC

## 2020-01-29 MED ORDER — SODIUM CHLORIDE 0.9 % IV BOLUS
500.0000 mL | Freq: Once | INTRAVENOUS | Status: AC
  Administered 2020-01-29: 500 mL via INTRAVENOUS

## 2020-01-29 MED ORDER — ENOXAPARIN SODIUM 40 MG/0.4ML ~~LOC~~ SOLN
40.0000 mg | SUBCUTANEOUS | Status: DC
  Administered 2020-01-30: 40 mg via SUBCUTANEOUS
  Filled 2020-01-29: qty 0.4

## 2020-01-29 MED ORDER — ONDANSETRON HCL 4 MG PO TABS: 4.0000 mg | ORAL_TABLET | Freq: Four times a day (QID) | ORAL | Status: DC | PRN

## 2020-01-29 NOTE — ED Triage Notes (Addendum)
Pt arrives via [facility] from home stating he is weak. Pt reports he "passed out" this morning but states he was in the bed and nobody could "get him to come to". Pt states he has been in the bed since he was "over here Thursday". Pt states he left Thursday because he could not wait. Pt A&Ox4 and in NAD, skin warm and dry. Pt reports riding to church with a friend who has covid "Sunday before last".

## 2020-01-29 NOTE — ED Notes (Signed)
Dr Owens Shark notified of positive COVID result, to put in new orders

## 2020-01-29 NOTE — ED Provider Notes (Signed)
[facility] Emergency Department Provider Note ____________________________________________   First MD Initiated Contact with Patient 01/29/20 2203     (approximate)  I have reviewed the triage vital signs and the nursing notes.   HISTORY  Chief Complaint Weakness  Level five caveat: History of present illness limited due to disorganized historian  HPI Larry Callahan is a 64 y.o. male with PMH as noted below who presents with multiple complaints, but primarily weakness over the last several days, gradual onset, persistent course, and associated with an episode of syncope today.  The patient reports that he has had difficulty walking due to the weakness, but then reports that he has been able to complete his ADLs.  He also reports a nonproductive cough but no fever or shortness of breath.  He initially denied any pain, but then reported bilateral shoulder pain on further questioning.  There was some possible history of a recent exposure to someone with COVID-19, however the patient is unclear about this.  Past Medical History:  Diagnosis Date  . Allergy   . Coronary artery disease   . Diabetes mellitus without complication ([place])   . Diastolic CHF ([place])   . GERD (gastroesophageal reflux disease)   . Hyperlipidemia   . Hypertension   . Renal insufficiency   . Sleep apnea     Patient Active Problem List   Diagnosis Date Noted  . Hematemesis 01/09/2020  . Hospital discharge follow-up 11/26/2019  . Cellulitis of left hand 09/26/2019  . Left-sided weakness 08/08/2019  . BMI 35.0-35.9,adult 08/08/2019  . Acute right hip pain 06/17/2019  . Inability to ambulate due to hip 06/17/2019  . CKD stage 3 due to type 2 diabetes mellitus ([place])   . Hypervolemia   . Acute on chronic diastolic CHF (congestive heart failure) ([place])   . Type 2 diabetes mellitus with stage 3b chronic kidney disease, with long-term current use of insulin ([place])   . Hypomagnesemia     . Full code status 05/20/2019  . Pleuritic chest pain   . CAP (community acquired pneumonia) 05/19/2019  . Hemoptysis 04/09/2019  . Shortness of breath 03/26/2019  . Uncontrolled type 2 diabetes mellitus with insulin therapy ([place]) 03/12/2019  . Acute on chronic heart failure with preserved ejection fraction (HFpEF) ([place])   . Chronic kidney disease, stage 3b   . Chronic systolic CHF (congestive heart failure) ([place]) 03/01/2019  . Diastolic dysfunction 26/94/8546  . Iron deficiency anemia 12/31/2018  . Atopic dermatitis 11/24/2018  . Screening for colon cancer 11/06/2017  . Uncontrolled type 2 diabetes mellitus with hyperglycemia ([place]) 11/06/2017  . Dermatitis, unspecified 06/09/2017  . Atherosclerotic heart disease of native coronary artery without angina pectoris 06/09/2017  . 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 Obstructive sleep apnea, adult 06/09/2017  . Morbid (severe) obesity due to excess calories ([place]) 06/09/2017  . Cigarette nicotine dependence with nicotine-induced disorder 06/09/2017  . Diabetic polyneuropathy associated with type 2 diabetes mellitus ([place]) 06/09/2017  . Allergic rhinitis due to pollen 06/09/2017  . Mixed hyperlipidemia 06/09/2017  . COPD (chronic obstructive pulmonary disease) ([place]) 06/09/2017  . Dyspnea on exertion 06/09/2017  . Wheezing 06/09/2017  . Dysuria 06/09/2017  . Snoring 06/09/2017  . Essential hypertension 06/09/2017  . Personal history of other malignant neoplasm of kidney 06/09/2017  . Pain in right hip 06/09/2017  . Impacted cerumen, bilateral 06/09/2017  . Secondary hyperparathyroidism, not elsewhere classified ([place]) 06/09/2017  . Type 2 diabetes mellitus with hyperglycemia (  [place]) 06/09/2017  . Tinea corporis 06/09/2017    Past Surgical History:  Procedure Laterality Date  . BACK SURGERY    . CARDIAC CATHETERIZATION    . CHOLECYSTECTOMY    . COLONOSCOPY WITH PROPOFOL N/A 04/12/2019   Procedure:  COLONOSCOPY WITH PROPOFOL;  Surgeon: [name], MD;  Location: [facility] ENDOSCOPY;  Service: Gastroenterology;  Laterality: N/A;  . ESOPHAGOGASTRODUODENOSCOPY N/A 04/12/2019   Procedure: ESOPHAGOGASTRODUODENOSCOPY (EGD);  Surgeon: [name], MD;  Location: [facility] ENDOSCOPY;  Service: Gastroenterology;  Laterality: N/A;  . ESOPHAGOGASTRODUODENOSCOPY (EGD) WITH PROPOFOL N/A 11/21/2019   Procedure: ESOPHAGOGASTRODUODENOSCOPY (EGD) WITH PROPOFOL;  Surgeon: [name], MD;  Location: [facility] ENDOSCOPY;  Service: Gastroenterology;  Laterality: N/A;  . FLEXIBLE BRONCHOSCOPY Bilateral 05/17/2019   Procedure: FLEXIBLE BRONCHOSCOPY;  Surgeon: [name], MD;  Location: [name] ORS;  Service: Pulmonary;  Laterality: Bilateral;  . left arm surgery    . nephrectomy Left   . PARATHYROIDECTOMY    . RIGHT HEART CATH N/A 03/29/2019   Procedure: RIGHT HEART CATH;  Surgeon: [name], MD;  Location: [facility] CV LAB;  Service: Cardiovascular;  Laterality: N/A;    Prior to Admission medications   Medication Sig Start Date End Date Taking? Authorizing Provider  acetaminophen (TYLENOL) 325 MG tablet Take 2 tablets (650 mg total) by mouth every 4 (four) hours as needed for headache or mild pain. 03/05/19   Gouru, Illene Silver, MD  albuterol (VENTOLIN HFA) 108 (90 Base) MCG/ACT inhaler Inhale 2 puffs into the lungs every 6 (six) hours as needed for wheezing or shortness of breath. 03/05/19   Gouru, Illene Silver, MD  amLODipine (NORVASC) 10 MG tablet Take 1 tablet (10 mg total) by mouth daily. 08/01/19   [name], NP  benzonatate (TESSALON PERLES) 100 MG capsule Take 1 capsule (100 mg total) by mouth 3 (three) times daily as needed for cough. 12/10/19 12/09/20  Carrie Mew, MD  Blood Glucose Calibration (TRUE METRIX LEVEL 1) Low SOLN Use as directed 04/04/18   [name], NP  Blood Glucose Monitoring Suppl (TRUE METRIX AIR GLUCOSE METER) DEVI 1 Device by Does not apply route 2 (two)  times daily. 04/13/18   [name], NP  ferrous gluconate (FERGON) 324 MG tablet Take 1 tablet (324 mg total) by mouth daily with breakfast. 08/01/19   [name], NP  furosemide (LASIX) 80 MG tablet Hold until outpatient PCP followup due to acute kidney injury. 11/21/19   Enzo Bi, MD  gabapentin (NEURONTIN) 100 MG capsule Take 1 capsule (100 mg total) by mouth in the morning, at noon, and at bedtime. 11/26/19   [name], NP  gemfibrozil (LOPID) 600 MG tablet Take 1 tablet (600 mg total) by mouth 2 (two) times daily with a meal. 01/17/20   Boscia, Heather E, NP  glimepiride (AMARYL) 2 MG tablet Take 1 tablet (2 mg total) by mouth at bedtime. 08/02/19   Lavera Guise, MD  glucose blood (TRUE METRIX BLOOD GLUCOSE TEST) test strip Use as instructed twice daily diag E11.65 07/30/19   [name], NP  hydrALAZINE (APRESOLINE) 100 MG tablet Take 1 tablet (100 mg total) by mouth 3 (three) times daily. 01/06/20   [name], NP  hydrOXYzine (ATARAX) 10 MG/5ML syrup Take 5 mLs (10 mg total) by mouth 3 (three) times daily as needed for itching. 09/24/19   Sable Feil, PA-C  insulin glargine, 1 Unit Dial, (TOUJEO) 300 UNIT/ML Solostar Pen Inject 26 Units into the  skin daily. 10/24/19   [name], NP  ipratropium-albuterol (DUONEB) 0.5-2.5 (3) MG/3ML SOLN Take 3 mLs by nebulization every 6 (six) hours as needed. 03/08/19   Kendell Bane, NP  isosorbide mononitrate (IMDUR) 30 MG 24 hr tablet Take 1 tablet (30 mg total) by mouth daily. 12/03/19   [name], MD  loratadine (CLARITIN) 10 MG tablet Take 1 tablet (10 mg total) by mouth daily as needed for allergies. 11/21/19   Enzo Bi, MD  metoprolol succinate (TOPROL-XL) 50 MG 24 hr tablet Take 1 tablet (50 mg total) by mouth at bedtime. Take with or immediately following a meal. 07/10/19   Lavera Guise, MD  nitroGLYCERIN (NITROSTAT) 0.4 MG SL tablet Place 1 tablet (0.4 mg total) under the tongue every 5 (five) minutes x  3 doses as needed for chest pain. 05/24/19   Loletha Grayer, MD  NOVOFINE 32G X 6 MM MISC To use with [place] injections daily 12/13/18   [name], NP  omeprazole (PRILOSEC) 40 MG capsule Take 1 capsule (40 mg total) by mouth in the morning and at bedtime. 01/10/20 02/09/20  Wyvonnia Dusky, MD  potassium chloride SA (KLOR-CON) 20 MEQ tablet Take 1 tablet (20 mEq total) by mouth 2 (two) times daily. 11/26/19   [name], NP  predniSONE (DELTASONE) 50 MG tablet Take 1 tablet (50 mg total) by mouth daily with breakfast. 01/08/20   Lavonia Drafts, MD  rosuvastatin (CRESTOR) 10 MG tablet Take 1 tablet (10 mg total) by mouth daily. 08/23/19 11/21/19  [name], MD  sucralfate (CARAFATE) 1 g tablet Take 1 tablet (1 g total) by mouth 4 (four) times daily -  with meals and at bedtime. 01/10/20 02/09/20  Wyvonnia Dusky, MD  tiotropium (SPIRIVA HANDIHALER) 18 MCG inhalation capsule Place 1 capsule (18 mcg total) into inhaler and inhale daily. 08/07/19 11/19/19  [name], NP  TRUEplus Lancets 33G MISC Use as directed twice daily diag E11.65 07/30/19   [name], NP  vitamin B-12 (CYANOCOBALAMIN) 1000 MCG tablet Take 1 tablet (1,000 mcg total) by mouth daily. 11/21/19   Enzo Bi, MD    Allergies Penicillins  Family History  Problem Relation Age of Onset  . Diabetes Mother   . Lung cancer Father   . Diabetes Sister   . Hypertension Sister   . Heart disease Sister   . Cancer Sister   . Bone cancer Brother     Social History Social History   Tobacco Use  . Smoking status: Former Smoker    Types: Cigarettes  . Smokeless tobacco: Former Systems developer    Types: Secondary school teacher  . Vaping Use: Never used  Substance Use Topics  . Alcohol use: No  . Drug use: No    Review of Systems Level five caveat: Review of systems limited due to disorganized historian Constitutional: No fever. Cardiovascular: Denies chest pain. Respiratory: Denies shortness of breath.  Positive  for cough. Gastrointestinal: No vomiting or diarrhea.  Musculoskeletal: Negative for back pain. Skin: Negative for rash. Neurological: Negative for headache.   ____________________________________________   PHYSICAL EXAM:  VITAL SIGNS: ED Triage Vitals  Enc Vitals Group     BP 01/29/20 1323 111/79     Pulse Rate 01/29/20 1323 78     Resp 01/29/20 1323 18     Temp 01/29/20 1323 98.2 F (36.8 C)     Temp Source 01/29/20 1323 Oral     SpO2 01/29/20 1323  96 %     Weight 01/29/20 1324 300 lb (136.1 kg)     Height --      Head Circumference --      Peak Flow --      Pain Score 01/29/20 1323 0     Pain Loc --      Pain Edu? --      Excl. in [place]? --     Constitutional: Alert and oriented.  Relatively well appearing and in no acute distress. Eyes: Conjunctivae are normal.  EOMI. Head: Atraumatic. Nose: No congestion/rhinnorhea. Mouth/Throat: Mucous membranes are dry.   Neck: Normal range of motion.  Cardiovascular: Normal rate, regular rhythm. Grossly normal heart sounds.  Good peripheral circulation. Respiratory: Normal respiratory effort.  No retractions. Lungs CTAB. Gastrointestinal: Soft and nontender. No distention.  Genitourinary: No flank tenderness. Musculoskeletal: No lower extremity edema.  Extremities warm and well perfused.  Neurologic:  Normal speech and language. No gross focal neurologic deficits are appreciated.  Skin:  Skin is warm and dry. No rash noted. Psychiatric: Mood and affect are normal. Speech and behavior are normal.  ____________________________________________   LABS (all labs ordered are listed, but only abnormal results are displayed)  Labs Reviewed  BASIC METABOLIC PANEL - Abnormal; Notable for the following components:      Result Value   Sodium 133 (*)    CO2 19 (*)    Glucose, Bld 130 (*)    BUN 52 (*)    Creatinine, Ser 3.04 (*)    Calcium 8.7 (*)    GFR calc non Af Amer 20 (*)    GFR calc Af Amer 24 (*)    All other components  within normal limits  CBC - Abnormal; Notable for the following components:   Hemoglobin 12.9 (*)    HCT 38.1 (*)    Platelets 148 (*)    All other components within normal limits  TROPONIN I (HIGH SENSITIVITY) - Abnormal; Notable for the following components:   Troponin I (High Sensitivity) 44 (*)    All other components within normal limits  SARS CORONAVIRUS 2 BY RT PCR (HOSPITAL ORDER, [place])  TSH  URINALYSIS, COMPLETE (UACMP) WITH MICROSCOPIC  CBG MONITORING, ED   ____________________________________________  EKG  ED ECG REPORT I, Arta Silence, the attending physician, personally viewed and interpreted this ECG.  Date: 01/29/2020 EKG Time: 1304 Rate: 86 Rhythm: normal sinus rhythm QRS Axis: normal Intervals: normal ST/T Wave abnormalities: Nonspecific T wave flattening laterally Narrative Interpretation: Nonspecific abnormalities with no evidence of acute ischemia  ____________________________________________  RADIOLOGY  CXR: Bilateral perihilar opacity consistent with multifocal pneumonia  ____________________________________________   PROCEDURES  Procedure(s) performed: No  Procedures  Critical Care performed: No ____________________________________________   INITIAL IMPRESSION / ASSESSMENT AND PLAN / ED COURSE  Pertinent labs & imaging results that were available during my care of the patient were reviewed by me and considered in my medical decision making (see chart for details).  64 year old male with PMH as noted above presents with generalized weakness over the last several days associated with nonproductive cough.  The patient reports a syncopal event this morning as well.  However, he is fairly disorganized in his history.  He is concerned for possible COVID-19 exposure.  I reviewed the past medical records in [place].  The patient was seen in the ED 5 days ago for multiple complaints including chest pain, and had  a reassuring work-up at that time.  He presented to the  ED again on 8/15 but left before being seen.  He was most recently admitted last month due to hematemesis thought to be due to possible NSAID use.  EGD in June 2021 was normal.  On exam, the patient is overall relatively well-appearing.  His vital signs are normal except for mild hypertension.  The physical exam is otherwise unremarkable except for slightly dry mucous membranes.  Initial lab work-up reveals a slightly worsened creatinine from baseline with otherwise normal electrolytes.  The CBC is unremarkable.  Differential includes dehydration, worsening AKI, COVID-19 or other infection, or less likely cardiac etiology.  I have added on a troponin, TSH, Covid swab, and urinalysis.  ----------------------------------------- 11:23 PM on 01/29/2020 -----------------------------------------  The patient's troponin has risen over the last 5 days and is now 44.  Chest x-ray shows findings concerning for multifocal pneumonia.  He is still pending the Covid swab.  I signed him out to the oncoming physician Dr. Owens Shark.  Plan will be for admission.  ____________________________________________   FINAL CLINICAL IMPRESSION(S) / ED DIAGNOSES  Final diagnoses:  Weakness      NEW MEDICATIONS STARTED DURING THIS VISIT:  New Prescriptions   No medications on file     Note:  This document was prepared using Dragon voice recognition software and may include unintentional dictation errors.   Arta Silence, MD 01/29/20 [phone]

## 2020-01-29 NOTE — H&P (Signed)
History and Physical    Larry Callahan KNL:[id number] DOB: Feb 04, 1956 DOA: 01/29/2020  PCP: [name], NP   Patient coming from: Home  I have personally briefly reviewed patient's old medical records in [place]  Chief Complaint: Weakness, cough  HPI: Larry Callahan is a 64 y.o. male with medical history significant for HTN, DM 2, CKD 3B, COPD, systolic CHF, recently hospitalized from 7/29-7/30 with hematemesis believed related to NSAID, with history of normal EGD June 2021, now presenting with weakness, with what appears to be a presyncopal episode while getting out of bed on the morning of arrival. He denies any further episodes of hematemesis or blood in the stool. He does report a productive cough and reports Covid exposure after riding to church with a friend who has Covid 2 weeks prior. He denies fever chills or chest pain ED Course: On arrival he was afebrile with O2 sats 96% on room air. Vitals within normal limits. Blood work significant for creatinine of 3.04 above his baseline of 1.81 for his CKD 3B, associated with mild metabolic acidosis with bicarb of 19. Potassium normal at 3.6. Troponin slightly elevated at 44. EKG nonacute. Chest x-ray when compared to chest x-ray from a week yet-the patient showed interval development of bilateral perihilar airspace disease consistent with multifocal pneumonia or edema. Covid PCR pending at time of request for admission  Review of Systems: As per HPI otherwise all other systems on review of systems negative.    Past Medical History:  Diagnosis Date  . Allergy   . Coronary artery disease   . Diabetes mellitus without complication ([place])   . Diastolic CHF ([place])   . GERD (gastroesophageal reflux disease)   . Hyperlipidemia   . Hypertension   . Renal insufficiency   . Sleep apnea     Past Surgical History:  Procedure Laterality Date  . BACK SURGERY    . CARDIAC CATHETERIZATION    . CHOLECYSTECTOMY     . COLONOSCOPY WITH PROPOFOL N/A 04/12/2019   Procedure: COLONOSCOPY WITH PROPOFOL;  Surgeon: [name], MD;  Location: [facility] ENDOSCOPY;  Service: Gastroenterology;  Laterality: N/A;  . ESOPHAGOGASTRODUODENOSCOPY N/A 04/12/2019   Procedure: ESOPHAGOGASTRODUODENOSCOPY (EGD);  Surgeon: [name], MD;  Location: [facility] ENDOSCOPY;  Service: Gastroenterology;  Laterality: N/A;  . ESOPHAGOGASTRODUODENOSCOPY (EGD) WITH PROPOFOL N/A 11/21/2019   Procedure: ESOPHAGOGASTRODUODENOSCOPY (EGD) WITH PROPOFOL;  Surgeon: [name], MD;  Location: [facility] ENDOSCOPY;  Service: Gastroenterology;  Laterality: N/A;  . FLEXIBLE BRONCHOSCOPY Bilateral 05/17/2019   Procedure: FLEXIBLE BRONCHOSCOPY;  Surgeon: [name], MD;  Location: [name] ORS;  Service: Pulmonary;  Laterality: Bilateral;  . left arm surgery    . nephrectomy Left   . PARATHYROIDECTOMY    . RIGHT HEART CATH N/A 03/29/2019   Procedure: RIGHT HEART CATH;  Surgeon: [name], MD;  Location: [facility] CV LAB;  Service: Cardiovascular;  Laterality: N/A;     reports that he has quit smoking. His smoking use included cigarettes. He has quit using smokeless tobacco.  His smokeless tobacco use included chew. He reports that he does not drink alcohol and does not use drugs.  Allergies  Allergen Reactions  . Penicillins Rash    Family History  Problem Relation Age of Onset  . Diabetes Mother   . Lung cancer Father   . Diabetes Sister   . Hypertension Sister   . Heart disease Sister   . Cancer Sister   . Bone cancer Brother  Prior to Admission medications   Medication Sig Start Date End Date Taking? Authorizing Provider  acetaminophen (TYLENOL) 325 MG tablet Take 2 tablets (650 mg total) by mouth every 4 (four) hours as needed for headache or mild pain. 03/05/19   Gouru, Illene Silver, MD  albuterol (VENTOLIN HFA) 108 (90 Base) MCG/ACT inhaler Inhale 2 puffs into the lungs every 6 (six) hours as needed for wheezing  or shortness of breath. 03/05/19   Gouru, Illene Silver, MD  amLODipine (NORVASC) 10 MG tablet Take 1 tablet (10 mg total) by mouth daily. 08/01/19   [name], NP  benzonatate (TESSALON PERLES) 100 MG capsule Take 1 capsule (100 mg total) by mouth 3 (three) times daily as needed for cough. 12/10/19 12/09/20  Carrie Mew, MD  Blood Glucose Calibration (TRUE METRIX LEVEL 1) Low SOLN Use as directed 04/04/18   [name], NP  Blood Glucose Monitoring Suppl (TRUE METRIX AIR GLUCOSE METER) DEVI 1 Device by Does not apply route 2 (two) times daily. 04/13/18   [name], NP  ferrous gluconate (FERGON) 324 MG tablet Take 1 tablet (324 mg total) by mouth daily with breakfast. 08/01/19   [name], NP  furosemide (LASIX) 80 MG tablet Hold until outpatient PCP followup due to acute kidney injury. 11/21/19   Enzo Bi, MD  gabapentin (NEURONTIN) 100 MG capsule Take 1 capsule (100 mg total) by mouth in the morning, at noon, and at bedtime. 11/26/19   [name], NP  gemfibrozil (LOPID) 600 MG tablet Take 1 tablet (600 mg total) by mouth 2 (two) times daily with a meal. 01/17/20   Boscia, Heather E, NP  glimepiride (AMARYL) 2 MG tablet Take 1 tablet (2 mg total) by mouth at bedtime. 08/02/19   Lavera Guise, MD  glucose blood (TRUE METRIX BLOOD GLUCOSE TEST) test strip Use as instructed twice daily diag E11.65 07/30/19   [name], NP  hydrALAZINE (APRESOLINE) 100 MG tablet Take 1 tablet (100 mg total) by mouth 3 (three) times daily. 01/06/20   [name], NP  hydrOXYzine (ATARAX) 10 MG/5ML syrup Take 5 mLs (10 mg total) by mouth 3 (three) times daily as needed for itching. 09/24/19   Sable Feil, PA-C  insulin glargine, 1 Unit Dial, (TOUJEO) 300 UNIT/ML Solostar Pen Inject 26 Units into the skin daily. 10/24/19   [name], NP  ipratropium-albuterol (DUONEB) 0.5-2.5 (3) MG/3ML SOLN Take 3 mLs by nebulization every 6 (six) hours as needed. 03/08/19   Kendell Bane,  NP  isosorbide mononitrate (IMDUR) 30 MG 24 hr tablet Take 1 tablet (30 mg total) by mouth daily. 12/03/19   [name], MD  loratadine (CLARITIN) 10 MG tablet Take 1 tablet (10 mg total) by mouth daily as needed for allergies. 11/21/19   Enzo Bi, MD  metoprolol succinate (TOPROL-XL) 50 MG 24 hr tablet Take 1 tablet (50 mg total) by mouth at bedtime. Take with or immediately following a meal. 07/10/19   Lavera Guise, MD  nitroGLYCERIN (NITROSTAT) 0.4 MG SL tablet Place 1 tablet (0.4 mg total) under the tongue every 5 (five) minutes x 3 doses as needed for chest pain. 05/24/19   Loletha Grayer, MD  NOVOFINE 32G X 6 MM MISC To use with [place] injections daily 12/13/18   [name], NP  omeprazole (PRILOSEC) 40 MG capsule Take 1 capsule (40 mg total) by mouth in the morning and at bedtime. 01/10/20 02/09/20  Wyvonnia Dusky, MD  potassium chloride SA (KLOR-CON) 20 MEQ tablet Take 1 tablet (20 mEq total) by mouth 2 (two) times daily. 11/26/19   [name], NP  predniSONE (DELTASONE) 50 MG tablet Take 1 tablet (50 mg total) by mouth daily with breakfast. 01/08/20   Lavonia Drafts, MD  rosuvastatin (CRESTOR) 10 MG tablet Take 1 tablet (10 mg total) by mouth daily. 08/23/19 11/21/19  [name], MD  sucralfate (CARAFATE) 1 g tablet Take 1 tablet (1 g total) by mouth 4 (four) times daily -  with meals and at bedtime. 01/10/20 02/09/20  Wyvonnia Dusky, MD  tiotropium (SPIRIVA HANDIHALER) 18 MCG inhalation capsule Place 1 capsule (18 mcg total) into inhaler and inhale daily. 08/07/19 11/19/19  [name], NP  TRUEplus Lancets 33G MISC Use as directed twice daily diag E11.65 07/30/19   [name], NP  vitamin B-12 (CYANOCOBALAMIN) 1000 MCG tablet Take 1 tablet (1,000 mcg total) by mouth daily. 11/21/19   Enzo Bi, MD    Physical Exam: Vitals:   01/29/20 1701 01/29/20 2014 01/29/20 2208 01/29/20 2214  BP: (!) 148/78 (!) 155/73 (!) 151/92   Pulse: 85 90  84  Resp: (!) 22 17     Temp: 98.2 F (36.8 C) 98.4 F (36.9 C)    TempSrc: Oral     SpO2: 99% 93%  95%  Weight:         Vitals:   01/29/20 1701 01/29/20 2014 01/29/20 2208 01/29/20 2214  BP: (!) 148/78 (!) 155/73 (!) 151/92   Pulse: 85 90  84  Resp: (!) 22 17    Temp: 98.2 F (36.8 C) 98.4 F (36.9 C)    TempSrc: Oral     SpO2: 99% 93%  95%  Weight:          Constitutional: Alert and oriented x 3 .  Dyspnea with conversation on minimal movement HEENT:      Head: Normocephalic and atraumatic.         Eyes: PERLA, EOMI, Conjunctivae are normal. Sclera is non-icteric.       Mouth/Throat: Mucous membranes are moist.       Neck: Supple with no signs of meningismus. Cardiovascular: Regular rate and rhythm. No murmurs, gallops, or rubs. 2+ symmetrical distal pulses are present . No JVD. No LE edema Respiratory: Respiratory effort increased.Lungs sounds diminished bilaterally.  Coarse breath sounds bilaterally gastrointestinal: Soft, non tender, and non distended with positive bowel sounds. No rebound or guarding. Genitourinary: No CVA tenderness. Musculoskeletal: Nontender with normal range of motion in all extremities. No cyanosis, or erythema of extremities. Neurologic: Normal speech and language. Face is symmetric. Moving all extremities. No gross focal neurologic deficits . Skin: Skin is warm, dry.  No rash or ulcers Psychiatric: Mood and affect are normal Speech and behavior are normal   Labs on Admission: I have personally reviewed following labs and imaging studies  CBC: Recent Labs  Lab 01/24/20 1431 01/26/20 2104 01/29/20 1329  WBC 4.8 4.4 5.3  HGB 11.3* 11.6* 12.9*  HCT 34.8* 35.6* 38.1*  MCV 89.7 89.2 86.2  PLT 176 158 177*   Basic Metabolic Panel: Recent Labs  Lab 01/24/20 1431 01/26/20 2104 01/29/20 1329  NA 137 137 133*  K 3.6 3.5 3.6  CL 104 104 99  CO2 22 21* 19*  GLUCOSE 109* 105* 130*  BUN 53* 45* 52*  CREATININE 2.05* 2.28* 3.04*  CALCIUM 8.8* 8.3* 8.7*    GFR: Estimated Creatinine Clearance: 33.7 mL/min (A) (by  C-G formula based on SCr of 3.04 mg/dL (H)). Liver Function Tests: Recent Labs  Lab 01/26/20 2104  AST 27  ALT 18  ALKPHOS 74  BILITOT 0.7  PROT 7.3  ALBUMIN 3.5   No results for input(s): LIPASE, AMYLASE in the last 168 hours. No results for input(s): AMMONIA in the last 168 hours. Coagulation Profile: No results for input(s): INR, PROTIME in the last 168 hours. Cardiac Enzymes: No results for input(s): CKTOTAL, CKMB, CKMBINDEX, TROPONINI in the last 168 hours. BNP (last 3 results) No results for input(s): PROBNP in the last 8760 hours. HbA1C: No results for input(s): HGBA1C in the last 72 hours. CBG: No results for input(s): GLUCAP in the last 168 hours. Lipid Profile: No results for input(s): CHOL, HDL, LDLCALC, TRIG, CHOLHDL, LDLDIRECT in the last 72 hours. Thyroid Function Tests: Recent Labs    01/29/20 2227  TSH 1.738   Anemia Panel: No results for input(s): VITAMINB12, FOLATE, FERRITIN, TIBC, IRON, RETICCTPCT in the last 72 hours. Urine analysis:    Component Value Date/Time   COLORURINE PINK (A) 12/10/2019 1230   APPEARANCEUR CLOUDY (A) 12/10/2019 1230   APPEARANCEUR Cloudy (A) 11/23/2018 1103   LABSPEC 1.009 12/10/2019 1230   LABSPEC 1.005 03/06/2013 0929   PHURINE 5.0 12/10/2019 1230   GLUCOSEU NEGATIVE 12/10/2019 1230   GLUCOSEU Negative 03/06/2013 0929   HGBUR LARGE (A) 12/10/2019 1230   BILIRUBINUR NEGATIVE 12/10/2019 1230   BILIRUBINUR negative 02/20/2019 1404   BILIRUBINUR Negative 11/23/2018 1103   BILIRUBINUR Negative 03/06/2013 0929   KETONESUR NEGATIVE 12/10/2019 1230   PROTEINUR 100 (A) 12/10/2019 1230   UROBILINOGEN 0.2 02/20/2019 1404   NITRITE NEGATIVE 12/10/2019 1230   LEUKOCYTESUR TRACE (A) 12/10/2019 1230   LEUKOCYTESUR Negative 03/06/2013 0929    Radiological Exams on Admission: DG Chest Portable 1 View  Result Date: 01/29/2020 CLINICAL DATA:  Cough, weakness, syncope  EXAM: PORTABLE CHEST 1 VIEW COMPARISON:  01/26/2020 FINDINGS: Single frontal view of the chest demonstrates a stable cardiac silhouette. There is been interval development of bilateral patchy perihilar airspace disease, which could reflect multifocal pneumonia or edema. No effusion or pneumothorax. No acute bony abnormalities. IMPRESSION: 1. Interval development of bilateral perihilar airspace disease, consistent with multifocal pneumonia or edema. Electronically Signed   By: Randa Ngo M.D.   On: 01/29/2020 22:34    EKG: Independently reviewed. Interpretation : Normal sinus rhythm with nonspecific ST-T wave changes  Assessment/Plan 64 year old male with history of HTN, DM 2, CKD 3B, COPD, systolic CHF, recently hospitalized from 7/29-7/30 with hematemesis believed related to NSAID, with history of normal EGD June 2021, now presenting with weakness, with what appears to be a presyncopal episode while getting out of bed on the morning of arrival. History of recent Covid exposure. Chest x-ray showing multifocal pneumonia    COVID-19 pneumonia -Patient presenting with cough, weakness, presyncope, multifocal pneumonia on chest x-ray -Follow Covid PCR (addendum: Covid PCR positive ) -Remdesivir, albuterol, multivitamins and as needed oxygen if Covid PCR positive. -No dexamethasone as not hypoxic at this time -Procalcitonin and monitor inflammatory biomarkers  Presyncope/generalized weakness   Acute kidney injury superimposed on CKD 3b ([place]) -Presyncope likely orthostatic related to dehydration from poor oral intake related to acute illness above -Creatinine 3.04 above baseline of 1.8 -IV hydration -Monitor renal function and avoid nephrotoxins  Elevated troponin -Suspect demand from acute illness given no chest pain nonacute EKG -We'll continue to trend in view of risk factors    Morbid (severe) obesity due to excess  calories ([place]) -Complicating factor to overall prognosis and care  COPD  (chronic obstructive pulmonary disease) ([place]) -Not acutely exacerbated -Continue home inhalers    Type 2 diabetes mellitus with stage 3b chronic kidney disease, with long-term current use of insulin (HCC) -Insulin sliding scale regimen. Add linagliptin if Covid positive    DVT prophylaxis: Lovenox  Code Status: full code  Family Communication:  none  Disposition Plan: Back to previous home environment Consults called: none  Status:At the time of admission, it appears that the appropriate admission status for this patient is INPATIENT. This is judged to be reasonable and necessary in order to provide the required intensity of service to ensure the patient's safety given the presenting symptoms, physical exam findings, and initial radiographic and laboratory data in the context of their  Comorbid conditions.   Patient requires inpatient status due to high intensity of service, high risk for further deterioration and high frequency of surveillance required.   I certify that at the point of admission it is my clinical judgment that the patient will require inpatient hospital care spanning beyond [place] MD [facility]     01/29/2020, 11:44 PM

## 2020-01-29 NOTE — ED Notes (Signed)
See triage note, pt reports weakness the past week, "passing out this morning" when getting out of bed. Denies SHOB, N/V, CP Reports bilateral shoulder pain. Productive cough Clear speech, alert and oriented

## 2020-01-30 DIAGNOSIS — E1121 Type 2 diabetes mellitus with diabetic nephropathy: Secondary | ICD-10-CM

## 2020-01-30 DIAGNOSIS — N179 Acute kidney failure, unspecified: Secondary | ICD-10-CM

## 2020-01-30 DIAGNOSIS — J189 Pneumonia, unspecified organism: Secondary | ICD-10-CM

## 2020-01-30 DIAGNOSIS — N189 Chronic kidney disease, unspecified: Secondary | ICD-10-CM

## 2020-01-30 DIAGNOSIS — U071 COVID-19: Secondary | ICD-10-CM

## 2020-01-30 DIAGNOSIS — Z794 Long term (current) use of insulin: Secondary | ICD-10-CM

## 2020-01-30 DIAGNOSIS — N1832 Chronic kidney disease, stage 3b: Secondary | ICD-10-CM

## 2020-01-30 DIAGNOSIS — J449 Chronic obstructive pulmonary disease, unspecified: Secondary | ICD-10-CM

## 2020-01-30 DIAGNOSIS — J1282 Pneumonia due to coronavirus disease 2019: Secondary | ICD-10-CM

## 2020-01-30 HISTORY — DX: Pneumonia due to coronavirus disease 2019: J12.82

## 2020-01-30 HISTORY — DX: COVID-19: U07.1

## 2020-01-30 LAB — COMPREHENSIVE METABOLIC PANEL
ALT: 21 U/L (ref 0–44)
AST: 40 U/L (ref 15–41)
Albumin: 3 g/dL — ABNORMAL LOW (ref 3.5–5.0)
Alkaline Phosphatase: 60 U/L (ref 38–126)
Anion gap: 15 (ref 5–15)
BUN: 59 mg/dL — ABNORMAL HIGH (ref 8–23)
CO2: 18 mmol/L — ABNORMAL LOW (ref 22–32)
Calcium: 8.2 mg/dL — ABNORMAL LOW (ref 8.9–10.3)
Chloride: 105 mmol/L (ref 98–111)
Creatinine, Ser: 2.67 mg/dL — ABNORMAL HIGH (ref 0.61–1.24)
GFR calc Af Amer: 28 mL/min — ABNORMAL LOW (ref >60)
GFR calc non Af Amer: 24 mL/min — ABNORMAL LOW (ref >60)
Glucose, Bld: 151 mg/dL — ABNORMAL HIGH (ref 70–99)
Potassium: 3.4 mmol/L — ABNORMAL LOW (ref 3.5–5.1)
Sodium: 138 mmol/L (ref 135–145)
Total Bilirubin: 0.6 mg/dL (ref 0.3–1.2)
Total Protein: 6.7 g/dL (ref 6.5–8.1)

## 2020-01-30 LAB — URINALYSIS, COMPLETE (UACMP) WITH MICROSCOPIC
Bilirubin Urine: NEGATIVE
Glucose, UA: 50 mg/dL — AB
Hgb urine dipstick: NEGATIVE
Ketones, ur: NEGATIVE mg/dL
Leukocytes,Ua: NEGATIVE
Nitrite: NEGATIVE
Protein, ur: >=300 mg/dL — AB
Specific Gravity, Urine: 1.012 (ref 1.005–1.030)
pH: 5 (ref 5.0–8.0)

## 2020-01-30 LAB — CBC
HCT: 34.2 % — ABNORMAL LOW (ref 39.0–52.0)
Hemoglobin: 11.6 g/dL — ABNORMAL LOW (ref 13.0–17.0)
MCH: 29.1 pg (ref 26.0–34.0)
MCHC: 33.9 g/dL (ref 30.0–36.0)
MCV: 85.7 fL (ref 80.0–100.0)
Platelets: 121 10*3/uL — ABNORMAL LOW (ref 150–400)
RBC: 3.99 MIL/uL — ABNORMAL LOW (ref 4.22–5.81)
RDW: 13.5 % (ref 11.5–15.5)
WBC: 3.7 10*3/uL — ABNORMAL LOW (ref 4.0–10.5)
nRBC: 0 % (ref 0.0–0.2)

## 2020-01-30 LAB — TROPONIN I (HIGH SENSITIVITY): Troponin I (High Sensitivity): 45 ng/L — ABNORMAL HIGH (ref <18)

## 2020-01-30 LAB — GLUCOSE, CAPILLARY
Glucose-Capillary: 137 mg/dL — ABNORMAL HIGH (ref 70–99)
Glucose-Capillary: 151 mg/dL — ABNORMAL HIGH (ref 70–99)
Glucose-Capillary: 156 mg/dL — ABNORMAL HIGH (ref 70–99)
Glucose-Capillary: 197 mg/dL — ABNORMAL HIGH (ref 70–99)
Glucose-Capillary: 239 mg/dL — ABNORMAL HIGH (ref 70–99)

## 2020-01-30 LAB — ABO/RH: ABO/RH(D): A POS

## 2020-01-30 LAB — HEMOGLOBIN A1C
Hgb A1c MFr Bld: 6.5 % — ABNORMAL HIGH (ref 4.8–5.6)
Mean Plasma Glucose: 139.85 mg/dL

## 2020-01-30 MED ORDER — ADULT MULTIVITAMIN W/MINERALS CH
1.0000 | ORAL_TABLET | Freq: Every day | ORAL | Status: DC
  Administered 2020-01-30 – 2020-02-03 (×5): 1 via ORAL
  Filled 2020-01-30 (×5): qty 1

## 2020-01-30 MED ORDER — ZINC SULFATE 220 (50 ZN) MG PO CAPS
220.0000 mg | ORAL_CAPSULE | Freq: Every day | ORAL | Status: DC
  Administered 2020-01-30 – 2020-02-03 (×5): 220 mg via ORAL
  Filled 2020-01-30 (×5): qty 1

## 2020-01-30 MED ORDER — ALBUTEROL SULFATE HFA 108 (90 BASE) MCG/ACT IN AERS
2.0000 | INHALATION_SPRAY | Freq: Four times a day (QID) | RESPIRATORY_TRACT | Status: DC
  Administered 2020-01-30 – 2020-02-03 (×17): 2 via RESPIRATORY_TRACT
  Filled 2020-01-30: qty 6.7

## 2020-01-30 MED ORDER — DEXAMETHASONE 4 MG PO TABS
6.0000 mg | ORAL_TABLET | Freq: Every day | ORAL | Status: DC
  Administered 2020-01-30 – 2020-02-03 (×5): 6 mg via ORAL
  Filled 2020-01-30: qty 1.5
  Filled 2020-01-30 (×3): qty 2
  Filled 2020-01-30: qty 1.5
  Filled 2020-01-30: qty 2

## 2020-01-30 MED ORDER — POTASSIUM CHLORIDE CRYS ER 20 MEQ PO TBCR
20.0000 meq | EXTENDED_RELEASE_TABLET | Freq: Once | ORAL | Status: AC
  Administered 2020-01-30: 20 meq via ORAL
  Filled 2020-01-30: qty 1

## 2020-01-30 MED ORDER — SODIUM CHLORIDE 0.9 % IV SOLN
200.0000 mg | Freq: Once | INTRAVENOUS | Status: AC
  Administered 2020-01-30: 200 mg via INTRAVENOUS
  Filled 2020-01-30: qty 200

## 2020-01-30 MED ORDER — LINAGLIPTIN 5 MG PO TABS
5.0000 mg | ORAL_TABLET | Freq: Every day | ORAL | Status: DC
  Administered 2020-01-30 – 2020-02-03 (×5): 5 mg via ORAL
  Filled 2020-01-30 (×6): qty 1

## 2020-01-30 MED ORDER — SODIUM CHLORIDE 0.9 % IV SOLN
100.0000 mg | Freq: Every day | INTRAVENOUS | Status: AC
  Administered 2020-01-31 – 2020-02-03 (×4): 100 mg via INTRAVENOUS
  Filled 2020-01-30: qty 100
  Filled 2020-01-30 (×3): qty 20

## 2020-01-30 MED ORDER — LORAZEPAM 2 MG/ML IJ SOLN
1.0000 mg | Freq: Once | INTRAMUSCULAR | Status: AC
  Administered 2020-01-30: 1 mg via INTRAVENOUS
  Filled 2020-01-30: qty 1

## 2020-01-30 MED ORDER — GUAIFENESIN-DM 100-10 MG/5ML PO SYRP
10.0000 mL | ORAL_SOLUTION | ORAL | Status: DC | PRN
  Filled 2020-01-30: qty 10

## 2020-01-30 MED ORDER — HYDROCOD POLST-CPM POLST ER 10-8 MG/5ML PO SUER: 5.0000 mL | Freq: Two times a day (BID) | ORAL | Status: DC | PRN

## 2020-01-30 MED ORDER — ASCORBIC ACID 500 MG PO TABS
500.0000 mg | ORAL_TABLET | Freq: Every day | ORAL | Status: DC
  Administered 2020-01-30 – 2020-02-03 (×5): 500 mg via ORAL
  Filled 2020-01-30 (×5): qty 1

## 2020-01-30 NOTE — ED Notes (Signed)
Pt SpO2 89%, placed on 2L 

## 2020-01-30 NOTE — ED Notes (Signed)
Meal provided to pt

## 2020-01-30 NOTE — ED Notes (Addendum)
Patient denies pain and is resting comfortably. Pt A/Ox4 at this time. Denies CP/SHOB a this time.

## 2020-01-30 NOTE — ED Notes (Signed)
Given phone as requested and drink.

## 2020-01-30 NOTE — Progress Notes (Signed)
Remdesivir - Pharmacy Brief Note   A/P:  Remdesivir 200 mg IVPB once followed by 100 mg IVPB daily x 4 days.   Hart Robinsons, PharmD Clinical Pharmacist   01/30/2020 12:14 AM

## 2020-01-30 NOTE — Progress Notes (Signed)
[facility]  PROGRESS NOTE  Larry Callahan VOH:[id number] DOB: [birth year] DOA: 01/29/2020 PCP: [name], NP   Brief HPI:   64 year old male with medical history of hypertension, diabetes mellitus type 2, CKD stage IIIb, COPD, systolic CHF, recently hospitalized from 01/09/2020 to 01/10/2020 with hematemesis believed to be NSAID induced normal EGD in June 2021 presented with weakness.  Patient says that he did not have any hematemesis or blood in the stool.  He does report a productive cough and reports Covid exposure of riding to church with a friend who had Covid 2 weeks ago.  He denied fever or chills.  In the ED SARS-CoV-2 test was positive.  Chest x-ray showed perihilar airspace disease consistent with multifocal pneumonia or edema.    Subjective   Patient seen and examined, breathing has improved since yesterday.   Assessment/Plan:     1. Multifocal pneumonia-patient has positive SARS-CoV-2 RT-PCR.  Started on remdesivir per pharmacy consultation.  Will start Decadron 6 mg p.o. daily.  Will obtain CRP, D-dimer, ferritin in a.m. follow inflammatory markers in a.m.  Patient is currently requiring 4 L/min of oxygen via nasal cannula.  Continue vitamin C, zinc sulfate. 2. Presyncope-likely from acute kidney injury superimposed on CKD stage IIIb.  Patient's creatinine was 3.04 with baseline 1.8.  Started on IV hydration.  Creatinine slowly improving.  Today creatinine is 2.67.  Will change IV normal saline rate to 75 mill per hour.  Follow BMP in am. 3. Diabetes mellitus type 2-continue linagliptin, which has been found to improve mortality in COVID-19 patients.  Continue sliding scale insulin NovoLog.  CBG well controlled. 4. COPD-no acute exacerbation, continue inhalers.      COVID-19 Labs  No results for input(s): DDIMER, FERRITIN, LDH, CRP in the last 72 hours.  Lab Results  Component Value Date   SARSCOV2NAA POSITIVE (A) 01/29/2020   [place] NEGATIVE  01/09/2020   [place] NEGATIVE 11/19/2019   [place] NEGATIVE 08/08/2019     Scheduled medications:   . albuterol  2 puff Inhalation Q6H  . vitamin C  500 mg Oral Daily  . dexamethasone  6 mg Oral Daily  . enoxaparin (LOVENOX) injection  40 mg Subcutaneous Q24H  . insulin aspart  0-20 Units Subcutaneous TID WC  . insulin aspart  0-5 Units Subcutaneous QHS  . linagliptin  5 mg Oral Daily  . multivitamin with minerals  1 tablet Oral Daily  . zinc sulfate  220 mg Oral Daily         CBG: Recent Labs  Lab 01/30/20 0030 01/30/20 0828 01/30/20 1150  GLUCAP 156* 137* 151*    SpO2: 98 % O2 Flow Rate (L/min): 4 L/min    CBC: Recent Labs  Lab 01/24/20 1431 01/26/20 2104 01/29/20 1329 01/30/20 0730  WBC 4.8 4.4 5.3 3.7*  HGB 11.3* 11.6* 12.9* 11.6*  HCT 34.8* 35.6* 38.1* 34.2*  MCV 89.7 89.2 86.2 85.7  PLT 176 158 148* 121*    Basic Metabolic Panel: Recent Labs  Lab 01/24/20 1431 01/26/20 2104 01/29/20 1329 01/30/20 0730  NA 137 137 133* 138  K 3.6 3.5 3.6 3.4*  CL 104 104 99 105  CO2 22 21* 19* 18*  GLUCOSE 109* 105* 130* 151*  BUN 53* 45* 52* 59*  CREATININE 2.05* 2.28* 3.04* 2.67*  CALCIUM 8.8* 8.3* 8.7* 8.2*     Liver Function Tests: Recent Labs  Lab 01/26/20 2104 01/30/20 0730  AST 27 40  ALT 18 21  ALKPHOS 74 60  BILITOT 0.7 0.6  PROT 7.3 6.7  ALBUMIN 3.5 3.0*     Antibiotics: Anti-infectives (From admission, onward)   Start     Dose/Rate Route Frequency Ordered Stop   01/31/20 1000  remdesivir 100 mg in sodium chloride 0.9 % 100 mL IVPB       "Followed by" Linked Group Details   100 mg 200 mL/hr over 30 Minutes Intravenous Daily 01/30/20 0004 02/04/20 0959   01/30/20 0130  remdesivir 200 mg in sodium chloride 0.9% 250 mL IVPB       "Followed by" Linked Group Details   200 mg 580 mL/hr over 30 Minutes Intravenous Once 01/30/20 0004 01/30/20 0342       DVT prophylaxis: Lovenox  Code Status: Full code  Family  Communication: No family at bedside    Status is: Inpatient  Dispo: The patient is from: Home              Anticipated d/c is to: Home              Anticipated d/c date is: 02/03/2020              Patient currently not medically stable for discharge  Barrier to discharge-COVID-19 pneumonia      Consultants:    Procedures:     Objective   Vitals:   01/30/20 0500 01/30/20 0600 01/30/20 0800 01/30/20 1146  BP: (!) 148/89 (!) 159/89 (!) 159/88 139/65  Pulse: 80 82 75 82  Resp: 14 (!) 34 14 17  Temp:      TempSrc:      SpO2:   92% 98%  Weight:        Intake/Output Summary (Last 24 hours) at 01/30/2020 1616 Last data filed at 01/30/2020 8315 Gross per 24 hour  Intake 500 ml  Output 200 ml  Net 300 ml    08/17 1901 - 08/19 0700 In: 500  Out: -   Filed Weights   01/29/20 1324  Weight: 136.1 kg    Physical Examination:    General: Appears in no acute distress  Cardiovascular: S1-S2, regular, no murmur auscultated  Respiratory: Decreased breath sounds at lung bases  Abdomen: Abdomen soft, nontender, no organomegaly  Extremities: No edema in the lower extremities  Neurologic: Alert, oriented x3, intact insight and judgment    Data Reviewed:   Recent Results (from the past 240 hour(s))  SARS Coronavirus 2 by RT PCR (hospital order, performed in [place] hospital lab) Nasopharyngeal Nasopharyngeal Swab     Status: Abnormal   Collection Time: 01/29/20 10:27 PM   Specimen: Nasopharyngeal Swab  Result Value Ref Range Status   SARS Coronavirus 2 POSITIVE (A) NEGATIVE Final    Comment: RESULT CALLED TO, READ BACK BY AND VERIFIED WITH: Nadeen Landau [id number] ON 01/29/20 SKL (NOTE) SARS-CoV-2 target nucleic acids are DETECTED  SARS-CoV-2 RNA is generally detectable in upper respiratory specimens  during the acute phase of infection.  Positive results are indicative  of the presence of the identified virus, but do not rule out bacterial infection or  co-infection with other pathogens not detected by the test.  Clinical correlation with patient history and  other diagnostic information is necessary to determine patient infection status.  The expected result is negative.  Fact Sheet for Patients:   StrictlyIdeas.no   Fact Sheet for Healthcare Providers:   BankingDealers.co.za    This test is not yet approved or cleared by the [place] FDA and  has been authorized for detection  and/or diagnosis of SARS-CoV-2 by FDA under an Emergency Use Authorization (EUA).  This EUA will remain in effect (meaning this  test can be used) for the duration of  the COVID-19 declaration under Section 564(b)(1) of the Act, 21 U.S.C. section 360-bbb-3(b)(1), unless the authorization is terminated or revoked sooner.  Performed at [facility], [address] [place], [place] [address]     No results for input(s): LIPASE, AMYLASE in the last 168 hours. No results for input(s): AMMONIA in the last 168 hours.  Cardiac Enzymes: No results for input(s): CKTOTAL, CKMB, CKMBINDEX, TROPONINI in the last 168 hours. BNP (last 3 results) Recent Labs    11/10/19 1928 12/21/19 1111 01/24/20 1431  BNP 23.1 44.5 94.5    ProBNP (last 3 results) No results for input(s): PROBNP in the last 8760 hours.  Studies:  DG Chest Portable 1 View  Result Date: 01/29/2020 CLINICAL DATA:  Cough, weakness, syncope EXAM: PORTABLE CHEST 1 VIEW COMPARISON:  01/26/2020 FINDINGS: Single frontal view of the chest demonstrates a stable cardiac silhouette. There is been interval development of bilateral patchy perihilar airspace disease, which could reflect multifocal pneumonia or edema. No effusion or pneumothorax. No acute bony abnormalities. IMPRESSION: 1. Interval development of bilateral perihilar airspace disease, consistent with multifocal pneumonia or edema. Electronically Signed   By: Randa Ngo M.D.   On:  01/29/2020 22:34       [place]   [facility] If 7PM-7AM, please contact night-coverage at [email or web address], Office  [phone]   01/30/2020, 4:16 PM  LOS: 1 day

## 2020-01-30 NOTE — ED Notes (Signed)
Pt states wears CPAP at bedtime, Verbal order for cPAP at bedtime Dr Damita Dunnings.  CPAP applied by RT

## 2020-01-30 NOTE — ED Notes (Signed)
Transport at bedside at this time.

## 2020-01-30 NOTE — ED Notes (Signed)
Pt resting at this time call bell within reach

## 2020-01-30 NOTE — ED Notes (Signed)
Transport order placed by ED Unit Secretary Carlene.

## 2020-01-30 NOTE — ED Notes (Signed)
Pt awake, states he uses CPAP at night only. Placed on 4L [place] while awake, states he is awake for the day. Appears comfortable with no change in RR effort.

## 2020-01-30 NOTE — ED Notes (Addendum)
Melissa, friend of patient. Permission to update from patient. Number [phone]   Pt given breakfast, assisted to cut up and eating currently. Denies further needs.

## 2020-01-31 ENCOUNTER — Other Ambulatory Visit: Payer: Self-pay

## 2020-01-31 DIAGNOSIS — U071 COVID-19: Principal | ICD-10-CM

## 2020-01-31 DIAGNOSIS — J1282 Pneumonia due to coronavirus disease 2019: Secondary | ICD-10-CM

## 2020-01-31 LAB — COMPREHENSIVE METABOLIC PANEL
ALT: 23 U/L (ref 0–44)
AST: 40 U/L (ref 15–41)
Albumin: 2.6 g/dL — ABNORMAL LOW (ref 3.5–5.0)
Alkaline Phosphatase: 52 U/L (ref 38–126)
Anion gap: 8 (ref 5–15)
BUN: 51 mg/dL — ABNORMAL HIGH (ref 8–23)
CO2: 20 mmol/L — ABNORMAL LOW (ref 22–32)
Calcium: 8.1 mg/dL — ABNORMAL LOW (ref 8.9–10.3)
Chloride: 108 mmol/L (ref 98–111)
Creatinine, Ser: 2.36 mg/dL — ABNORMAL HIGH (ref 0.61–1.24)
GFR calc Af Amer: 32 mL/min — ABNORMAL LOW (ref >60)
GFR calc non Af Amer: 28 mL/min — ABNORMAL LOW (ref >60)
Glucose, Bld: 254 mg/dL — ABNORMAL HIGH (ref 70–99)
Potassium: 3.7 mmol/L (ref 3.5–5.1)
Sodium: 136 mmol/L (ref 135–145)
Total Bilirubin: 0.4 mg/dL (ref 0.3–1.2)
Total Protein: 6.1 g/dL — ABNORMAL LOW (ref 6.5–8.1)

## 2020-01-31 LAB — C-REACTIVE PROTEIN: CRP: 7.4 mg/dL — ABNORMAL HIGH (ref <1.0)

## 2020-01-31 LAB — GLUCOSE, CAPILLARY
Glucose-Capillary: 197 mg/dL — ABNORMAL HIGH (ref 70–99)
Glucose-Capillary: 196 mg/dL — ABNORMAL HIGH (ref 70–99)
Glucose-Capillary: 300 mg/dL — ABNORMAL HIGH (ref 70–99)
Glucose-Capillary: 260 mg/dL — ABNORMAL HIGH (ref 70–99)

## 2020-01-31 LAB — FERRITIN: Ferritin: 243 ng/mL (ref 24–336)

## 2020-01-31 LAB — FIBRIN DERIVATIVES D-DIMER (ARMC ONLY): Fibrin derivatives D-dimer (ARMC): 1000.63 ng/mL (FEU) — ABNORMAL HIGH (ref 0.00–499.00)

## 2020-01-31 MED ORDER — HYDRALAZINE HCL 50 MG PO TABS
25.0000 mg | ORAL_TABLET | Freq: Four times a day (QID) | ORAL | Status: DC | PRN
  Administered 2020-01-31: 17:00:00 25 mg via ORAL
  Filled 2020-01-31: qty 1

## 2020-01-31 MED ORDER — ENOXAPARIN SODIUM 40 MG/0.4ML ~~LOC~~ SOLN
40.0000 mg | Freq: Two times a day (BID) | SUBCUTANEOUS | Status: DC
  Administered 2020-01-31 – 2020-02-03 (×7): 40 mg via SUBCUTANEOUS
  Filled 2020-01-31 (×7): qty 0.4

## 2020-01-31 MED ORDER — AMLODIPINE BESYLATE 10 MG PO TABS
10.0000 mg | ORAL_TABLET | Freq: Every day | ORAL | Status: DC
  Administered 2020-01-31 – 2020-02-03 (×4): 10 mg via ORAL
  Filled 2020-01-31 (×4): qty 1

## 2020-01-31 MED ORDER — METOPROLOL SUCCINATE ER 50 MG PO TB24
50.0000 mg | ORAL_TABLET | Freq: Every day | ORAL | Status: DC
  Administered 2020-01-31 – 2020-02-02 (×3): 50 mg via ORAL
  Filled 2020-01-31 (×3): qty 1

## 2020-01-31 NOTE — Progress Notes (Signed)
Inpatient Diabetes Program Recommendations  AACE/ADA: New Consensus Statement on Inpatient Glycemic Control (2015)  Target Ranges:  Prepandial:   less than 140 mg/dL      Peak postprandial:   less than 180 mg/dL (1-2 hours)      Critically ill patients:  140 - 180 mg/dL   Lab Results  Component Value Date   GLUCAP 197 (H) 01/31/2020   HGBA1C 6.5 (H) 01/30/2020    Review of Glycemic Control  Diabetes history: DM 2 Outpatient Diabetes medications: Toujeo 26 units qhs, Amaryl 2 mg qhs Current orders for Inpatient glycemic control:  Tradjenta 5 mg Daily Novolog 0-20 units tid + hs  Decadron 6 mg Q24 hours A1c 6.5% on 8/19  Inpatient Diabetes Program Recommendations:    -Consider Levemir 5 units bid - Consider reducing Novolog Correction to 0-15 units tid + hs (renal function)  Thanks,  Tama Headings RN, MSN, BC-ADM Inpatient Diabetes Coordinator Team Pager [phone] (8a-5p)

## 2020-01-31 NOTE — Progress Notes (Addendum)
[facility]  PROGRESS NOTE  Gen Larry Callahan OMA:[id number] DOB: 07/05/1955 DOA: 01/29/2020 PCP: [name], NP   Brief HPI:   64 year old male with medical history of hypertension, diabetes mellitus type 2, CKD stage IIIb, COPD, systolic CHF, recently hospitalized from 01/09/2020 to 01/10/2020 with hematemesis believed to be NSAID induced normal EGD in June 2021 presented with weakness.  Patient says that he did not have any hematemesis or blood in the stool.  He does report a productive cough and reports Covid exposure of riding to church with a friend who had Covid 2 weeks ago.  He denied fever or chills.  In the ED SARS-CoV-2 test was positive.  Chest x-ray showed perihilar airspace disease consistent with multifocal pneumonia or edema.  Subjective   Patient seen and examined, breathing is improving.  Now requiring 2 L/min of oxygen via nasal cannula.  Down from 4 L/min.   Assessment/Plan:    1. Multifocal pneumonia-patient has positive SARS-CoV-2 RT-PCR.  Started on remdesivir per pharmacy consultation.  Continue Decadron 6 mg daily.  CRP is 7.4.  Follow CRP, D-dimer, ferritin in a.m.   Continue vitamin C, zinc sulfate. 2. Presyncope-likely from acute kidney injury superimposed on CKD stage IIIb.  Patient's creatinine was 3.04 with baseline 1.8.  Started on IV hydration.  Creatinine slowly improving.  Today creatinine is 2.36, almost at baseline.  Change IV fluids to [facility].    Follow BMP in am. 3. Hypertension-blood pressure is now elevated, restart metoprolol, amlodipine.  Will start hydralazine 25 mg every 6 hours as needed for BP greater than 160/100. 4. Diabetes mellitus type 2-continue linagliptin, which has been found to improve mortality in COVID-19 patients.  Continue sliding scale insulin NovoLog.  CBG well controlled. 5. COPD-no acute exacerbation, continue inhalers.   COVID-19 Labs  Recent Labs    01/31/20 0427  FERRITIN 243  CRP 7.4*    Lab Results   Component Value Date   SARSCOV2NAA POSITIVE (A) 01/29/2020   [place] NEGATIVE 01/09/2020   [place] NEGATIVE 11/19/2019   [place] NEGATIVE 08/08/2019     Scheduled medications:   . albuterol  2 puff Inhalation Q6H  . vitamin C  500 mg Oral Daily  . dexamethasone  6 mg Oral Daily  . enoxaparin (LOVENOX) injection  40 mg Subcutaneous Q12H  . insulin aspart  0-20 Units Subcutaneous TID WC  . insulin aspart  0-5 Units Subcutaneous QHS  . linagliptin  5 mg Oral Daily  . multivitamin with minerals  1 tablet Oral Daily  . zinc sulfate  220 mg Oral Daily         CBG: Recent Labs  Lab 01/30/20 1150 01/30/20 1846 01/30/20 2225 01/31/20 0858 01/31/20 1131  GLUCAP 151* 239* 197* 197* 196*    SpO2: 97 % O2 Flow Rate (L/min): 2 L/min    CBC: Recent Labs  Lab 01/24/20 1431 01/26/20 2104 01/29/20 1329 01/30/20 0730  WBC 4.8 4.4 5.3 3.7*  HGB 11.3* 11.6* 12.9* 11.6*  HCT 34.8* 35.6* 38.1* 34.2*  MCV 89.7 89.2 86.2 85.7  PLT 176 158 148* 121*    Basic Metabolic Panel: Recent Labs  Lab 01/24/20 1431 01/26/20 2104 01/29/20 1329 01/30/20 0730 01/31/20 0427  NA 137 137 133* 138 136  K 3.6 3.5 3.6 3.4* 3.7  CL 104 104 99 105 108  CO2 22 21* 19* 18* 20*  GLUCOSE 109* 105* 130* 151* 254*  BUN 53* 45* 52* 59* 51*  CREATININE 2.05* 2.28* 3.04* 2.67* 2.36*  CALCIUM 8.8* 8.3* 8.7* 8.2* 8.1*     Liver Function Tests: Recent Labs  Lab 01/26/20 2104 01/30/20 0730 01/31/20 0427  AST 27 40 40  ALT 18 21 23   ALKPHOS 74 60 52  BILITOT 0.7 0.6 0.4  PROT 7.3 6.7 6.1*  ALBUMIN 3.5 3.0* 2.6*     Antibiotics: Anti-infectives (From admission, onward)   Start     Dose/Rate Route Frequency Ordered Stop   01/31/20 1000  remdesivir 100 mg in sodium chloride 0.9 % 100 mL IVPB       "Followed by" Linked Group Details   100 mg 200 mL/hr over 30 Minutes Intravenous Daily 01/30/20 0004 02/04/20 0959   01/30/20 0130  remdesivir 200 mg in sodium chloride 0.9% 250  mL IVPB       "Followed by" Linked Group Details   200 mg 580 mL/hr over 30 Minutes Intravenous Once 01/30/20 0004 01/30/20 0342       DVT prophylaxis: Lovenox  Code Status: Full code  Family Communication: No family at bedside    Status is: Inpatient  Dispo: The patient is from: Home              Anticipated d/c is to: Home              Anticipated d/c date is: 02/03/2020              Patient currently not medically stable for discharge  Barrier to discharge-COVID-19 pneumonia      Consultants:    Procedures:     Objective   Vitals:   01/30/20 2240 01/31/20 0500 01/31/20 0901 01/31/20 1134  BP: (!) 141/71 (!) 142/70 (!) 152/64 (!) 179/62  Pulse: 69 70 62 71  Resp: 20  20 (!) 24  Temp: 98 F (36.7 C) 98 F (36.7 C) 98 F (36.7 C)   TempSrc: Oral Oral    SpO2: 94%  92% 97%  Weight: (!) 137 kg     Height: 5\' 11"  (1.803 m)       Intake/Output Summary (Last 24 hours) at 01/31/2020 1312 Last data filed at 01/31/2020 1300 Gross per 24 hour  Intake 2000 ml  Output 2235 ml  Net -235 ml    08/18 1901 - 08/20 0700 In: 2500 [I.V.:2000] Out: 1435 [Urine:1435]  Filed Weights   01/29/20 1324 01/30/20 2240  Weight: 136.1 kg (!) 137 kg    Physical Examination:    General-appears in no acute distress  Heart-S1-S2, regular, no murmur auscultated  Lungs-decreased breath sounds at lung bases  Abdomen-soft, nontender, no organomegaly  Extremities-no edema in the lower extremities  Neuro-alert, oriented x3, no focal deficit noted   Data Reviewed:   Recent Results (from the past 240 hour(s))  SARS Coronavirus 2 by RT PCR (hospital order, performed in [place] hospital lab) Nasopharyngeal Nasopharyngeal Swab     Status: Abnormal   Collection Time: 01/29/20 10:27 PM   Specimen: Nasopharyngeal Swab  Result Value Ref Range Status   SARS Coronavirus 2 POSITIVE (A) NEGATIVE Final    Comment: RESULT CALLED TO, READ BACK BY AND VERIFIED WITH: Nadeen Landau [id number] ON 01/29/20 SKL (NOTE) SARS-CoV-2 target nucleic acids are DETECTED  SARS-CoV-2 RNA is generally detectable in upper respiratory specimens  during the acute phase of infection.  Positive results are indicative  of the presence of the identified virus, but do not rule out bacterial infection or co-infection with other pathogens not detected by the test.  Clinical correlation with patient  history and  other diagnostic information is necessary to determine patient infection status.  The expected result is negative.  Fact Sheet for Patients:   StrictlyIdeas.no   Fact Sheet for Healthcare Providers:   BankingDealers.co.za    This test is not yet approved or cleared by the [place] FDA and  has been authorized for detection and/or diagnosis of SARS-CoV-2 by FDA under an Emergency Use Authorization (EUA).  This EUA will remain in effect (meaning this  test can be used) for the duration of  the COVID-19 declaration under Section 564(b)(1) of the Act, 21 U.S.C. section 360-bbb-3(b)(1), unless the authorization is terminated or revoked sooner.  Performed at [facility], [address] [place], [place] [address]     BNP (last 3 results) Recent Labs    11/10/19 1928 12/21/19 1111 01/24/20 1431  BNP 23.1 44.5 94.5     Studies:  DG Chest Portable 1 View  Result Date: 01/29/2020 CLINICAL DATA:  Cough, weakness, syncope EXAM: PORTABLE CHEST 1 VIEW COMPARISON:  01/26/2020 FINDINGS: Single frontal view of the chest demonstrates a stable cardiac silhouette. There is been interval development of bilateral patchy perihilar airspace disease, which could reflect multifocal pneumonia or edema. No effusion or pneumothorax. No acute bony abnormalities. IMPRESSION: 1. Interval development of bilateral perihilar airspace disease, consistent with multifocal pneumonia or edema. Electronically Signed   By: Randa Ngo M.D.    On: 01/29/2020 22:34     [place]   [facility] If 7PM-7AM, please contact night-coverage at [email or web address], Office  [phone]   01/31/2020, 1:12 PM  LOS: 2 days

## 2020-01-31 NOTE — Care Management Important Message (Signed)
Important Message  Patient Details  Name: Larry Callahan MRN: [id number] Date of Birth: [birth year]   Medicare Important Message Given:  Yes  Initial Medicare IM given by Patient Access Associate on 01/31/2020 at 9:53am.   Dannette Barbara 01/31/2020, 11:25 AM

## 2020-01-31 NOTE — Progress Notes (Signed)
PHARMACIST - PHYSICIAN COMMUNICATION  CONCERNING:  Enoxaparin (Lovenox) for DVT Prophylaxis    RECOMMENDATION: Patient was prescribed enoxaparin 40mg  q24 hours for VTE prophylaxis.   Filed Weights   01/29/20 1324 01/30/20 2240  Weight: 136.1 kg (300 lb) (!) 137 kg (302 lb 0.5 oz)    Body mass index is 42.12 kg/m.  Estimated Creatinine Clearance: 43.5 mL/min (A) (by C-G formula based on SCr of 2.36 mg/dL (H)).  Normalized CrCl = 31 mL/min  Based on [facility] policy patient is candidate for enoxaparin 40mg  every 12 hour dosing due to BMI being >40.  DESCRIPTION: Pharmacy has adjusted enoxaparin dose per [name] policy.  Patient is now receiving enoxaparin 40mg  every 12 hours.   Benita Gutter 01/31/2020 7:26 AM

## 2020-02-01 LAB — GLUCOSE, CAPILLARY
Glucose-Capillary: 135 mg/dL — ABNORMAL HIGH (ref 70–99)
Glucose-Capillary: 146 mg/dL — ABNORMAL HIGH (ref 70–99)
Glucose-Capillary: 284 mg/dL — ABNORMAL HIGH (ref 70–99)
Glucose-Capillary: 321 mg/dL — ABNORMAL HIGH (ref 70–99)

## 2020-02-01 LAB — C-REACTIVE PROTEIN: CRP: 3.3 mg/dL — ABNORMAL HIGH (ref <1.0)

## 2020-02-01 LAB — FERRITIN: Ferritin: 242 ng/mL (ref 24–336)

## 2020-02-01 LAB — FIBRIN DERIVATIVES D-DIMER (ARMC ONLY): Fibrin derivatives D-dimer (ARMC): 795.84 ng/mL (FEU) — ABNORMAL HIGH (ref 0.00–499.00)

## 2020-02-01 NOTE — TOC Initial Note (Signed)
Transition of Care [facility]) - Initial/Assessment Note    Patient Details  Name: Larry Callahan MRN: [id number] Date of Birth: 1955-08-02  [facility] CM/SW Contact:    Shelbie Hutching, RN Phone Number: 02/01/2020, 11:53 AM  Clinical Narrative:                 Patient admitted to the hospital with COVID, patient has a history of COPD and CHF not currently on home O2 but requiring acute O2 in the hospital.  Patient reports that he does sleep with a CPAP machine at home.  Patient lives alone and is independent.  He reports that he has 2 sisters one lives in the same mobile home park as him but she doesn't get out much and his other older sister provides transportation for him.  Patient does not drive.  Patient has a cane and a new walker but he reports he does not need them to walk with.  Patient has Para-medicine that calls and comes out to see him routinely.  Patient reports that he has had home health services in the past but he is unable to tell me which agency it was with.  Patient agrees to being set up with home health again at discharge.  Referral given to [facility].  Jana Half will accept for RN, PT, and OT.   Patient has a scale at home and weighs daily.  Expected Discharge Plan: [place] Barriers to Discharge: Continued Medical Work up   Patient Goals and CMS Choice Patient states their goals for this hospitalization and ongoing recovery are:: Wants to go home but MD wants to keep him for another couple days CMS Medicare.gov Compare Post Acute Care list provided to:: Patient Choice offered to / list presented to : Patient  Expected Discharge Plan and Services Expected Discharge Plan: [place]   Discharge Planning Services: CM Consult Post Acute Care Choice: [place] arrangements for the past 2 months: Mobile Home                           HH Arranged: RN, PT, OT [facility] Agency: [facility] Date [facility]  Agency Contacted: 02/01/20 Time [place]: 60 Representative spoke with at [place]: Jana Half  Prior Living Arrangements/Services Living arrangements for the past 2 months: Mobile Home Lives with:: Self Patient language and need for interpreter reviewed:: Yes Do you feel safe going back to the place where you live?: Yes      Need for Family Participation in Patient Care: Yes (Comment) (COPD, CHF) Care giver support system in place?: Yes (comment) (sisters) Current home services: DME (walker and cane) Criminal Activity/Legal Involvement Pertinent to Current Situation/Hospitalization: No - Comment as needed  Activities of Daily Living Home Assistive Devices/Equipment: None ADL Screening (condition at time of admission) Patient's cognitive ability adequate to safely complete daily activities?: Yes Is the patient deaf or have difficulty hearing?: No Does the patient have difficulty seeing, even when wearing glasses/contacts?: No Does the patient have difficulty concentrating, remembering, or making decisions?: No Patient able to express need for assistance with ADLs?: Yes Does the patient have difficulty dressing or bathing?: Yes Independently performs ADLs?: Yes (appropriate for developmental age) Does the patient have difficulty walking or climbing stairs?: No Weakness of Legs: None Weakness of Arms/Hands: None  Permission Sought/Granted Permission sought to share information with : Case Manager, Other (comment) Permission granted  to share information with : Yes, Verbal Permission Granted     Permission granted to share info w AGENCY: [facility]        Emotional Assessment   Attitude/Demeanor/Rapport: Engaged Affect (typically observed): Accepting Orientation: : Oriented to Self, Oriented to Place, Oriented to  Time, Oriented to Situation Alcohol / Substance Use: Not Applicable Psych Involvement: No (comment)  Admission diagnosis:  Weakness  [R53.1] Pneumonia due to COVID-19 virus [U07.1, J12.82] Suspected COVID-19 virus infection [Z20.822] Patient Active Problem List   Diagnosis Date Noted  . Pneumonia due to COVID-19 virus 01/30/2020  . Elevated troponin 01/29/2020  . Suspected COVID-19 virus infection 01/29/2020  . Postural dizziness with presyncope 01/29/2020  . Hematemesis 01/09/2020  . Hospital discharge follow-up 11/26/2019  . Cellulitis of left hand 09/26/2019  . Generalized weakness 08/08/2019  . BMI 35.0-35.9,adult 08/08/2019  . Acute right hip pain 06/17/2019  . Inability to ambulate due to hip 06/17/2019  . CKD stage 3 due to type 2 diabetes mellitus ([place])   . Hypervolemia   . Acute on chronic diastolic CHF (congestive heart failure) ([place])   . Type 2 diabetes mellitus with stage 3b chronic kidney disease, with long-term current use of insulin ([place])   . Hypomagnesemia   . Full code status 05/20/2019  . Pleuritic chest pain   . Multifocal pneumonia 05/19/2019  . Acute kidney injury superimposed on CKD 3b ([place]) 05/09/2019  . Hemoptysis 04/09/2019  . Shortness of breath 03/26/2019  . Uncontrolled type 2 diabetes mellitus with insulin therapy ([place]) 03/12/2019  . Acute on chronic heart failure with preserved ejection fraction (HFpEF) ([place])   . Chronic kidney disease, stage 3b   . Chronic systolic CHF (congestive heart failure) ([place]) 03/01/2019  . Diastolic dysfunction 46/96/2952  . Iron deficiency anemia 12/31/2018  . Atopic dermatitis 11/24/2018  . Screening for colon cancer 11/06/2017  . Uncontrolled type 2 diabetes mellitus with hyperglycemia ([place]) 11/06/2017  . Dermatitis, unspecified 06/09/2017  . Atherosclerotic heart disease of native coronary artery without angina pectoris 06/09/2017  . 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 Obstructive sleep apnea, adult 06/09/2017  . Morbid (severe) obesity due to excess calories ([place]) 06/09/2017  . Cigarette nicotine dependence  with nicotine-induced disorder 06/09/2017  . Diabetic polyneuropathy associated with type 2 diabetes mellitus ([place]) 06/09/2017  . Allergic rhinitis due to pollen 06/09/2017  . Mixed hyperlipidemia 06/09/2017  . COPD (chronic obstructive pulmonary disease) ([place]) 06/09/2017  . Dyspnea on exertion 06/09/2017  . Wheezing 06/09/2017  . Dysuria 06/09/2017  . Snoring 06/09/2017  . Essential hypertension 06/09/2017  . Personal history of other malignant neoplasm of kidney 06/09/2017  . Pain in right hip 06/09/2017  . Impacted cerumen, bilateral 06/09/2017  . Secondary hyperparathyroidism, not elsewhere classified ([place]) 06/09/2017  . Type 2 diabetes mellitus with hyperglycemia ([place]) 06/09/2017  . Tinea corporis 06/09/2017   PCP:  [name], NP Pharmacy:   [place], [place] - [address] [place] [address] Phone: [phone] Fax: [phone]     Social Determinants of Health (SDOH) Interventions    Readmission Risk Interventions Readmission Risk Prevention Plan 02/01/2020 01/31/2020 05/10/2019  Transportation Screening Complete Complete Complete  Medication Review Press photographer) Complete Complete Complete  PCP or Specialist appointment within 3-5 days of discharge Complete Complete -  [place] or Home Care Consult Complete Complete -  SW Recovery Care/Counseling Consult Complete Complete -  SW Consult Not  Complete Comments - - -  Palliative Care Screening Not Applicable Not Applicable Not Applicable  Skilled Nursing Facility Not Applicable Not Applicable Not Applicable  Some recent data might be hidden

## 2020-02-01 NOTE — Progress Notes (Signed)
[facility]  PROGRESS NOTE  Larry Callahan NLG:[id number] DOB: 1955/11/29 DOA: 01/29/2020 PCP: [name], NP   Brief HPI:   64 year old male with medical history of hypertension, diabetes mellitus type 2, CKD stage IIIb, COPD, systolic CHF, recently hospitalized from 01/09/2020 to 01/10/2020 with hematemesis believed to be NSAID induced normal EGD in June 2021 presented with weakness.  Patient says that he did not have any hematemesis or blood in the stool.  He does report a productive cough and reports Covid exposure of riding to church with a friend who had Covid 2 weeks ago.  He denied fever or chills.  In the ED SARS-CoV-2 test was positive.  Chest x-ray showed perihilar airspace disease consistent with multifocal pneumonia or edema.  Subjective   Patient seen and examined, denies shortness of breath.   Assessment/Plan:    1. Multifocal pneumonia-patient has positive SARS-CoV-2 RT-PCR.  Started on remdesivir per pharmacy consultation.  Continue Decadron 6 mg daily.  CRP is 7.4.  Follow CRP, D-dimer, ferritin in a.m.   Continue vitamin C, zinc sulfate. 2. Presyncope/acute kidney injury neuro oriented-likely from acute kidney injury superimposed on CKD stage IIIb.  Patient's creatinine was 3.04 with baseline 1.8.  Started on IV hydration.  Creatinine slowly improving.  Today creatinine is 2.36, almost at baseline.  Follow BMP in am. 3. Hypertension-blood pressure is now elevated, restart metoprolol, amlodipine.  Will start hydralazine 25 mg every 6 hours as needed for BP greater than 160/100. 4. Diabetes mellitus type 2-continue linagliptin, which has been found to improve mortality in COVID-19 patients.  Continue sliding scale insulin NovoLog.  CBG well controlled. 5. COPD-no acute exacerbation, continue inhalers.   COVID-19 Labs  Recent Labs    01/31/20 0427 02/01/20 0521  FERRITIN 243 242  CRP 7.4*  --     Lab Results  Component Value Date   SARSCOV2NAA  POSITIVE (A) 01/29/2020   SARSCOV2NAA NEGATIVE 01/09/2020   [place] NEGATIVE 11/19/2019   [place] NEGATIVE 08/08/2019     Scheduled medications:   . albuterol  2 puff Inhalation Q6H  . amLODipine  10 mg Oral Daily  . vitamin C  500 mg Oral Daily  . dexamethasone  6 mg Oral Daily  . enoxaparin (LOVENOX) injection  40 mg Subcutaneous Q12H  . insulin aspart  0-20 Units Subcutaneous TID WC  . insulin aspart  0-5 Units Subcutaneous QHS  . linagliptin  5 mg Oral Daily  . metoprolol succinate  50 mg Oral QHS  . multivitamin with minerals  1 tablet Oral Daily  . zinc sulfate  220 mg Oral Daily         CBG: Recent Labs  Lab 01/31/20 1131 01/31/20 1704 01/31/20 2133 02/01/20 0827 02/01/20 1144  GLUCAP 196* 300* 260* 135* 146*    SpO2: 98 % O2 Flow Rate (L/min): 2 L/min    CBC: Recent Labs  Lab 01/26/20 2104 01/29/20 1329 01/30/20 0730  WBC 4.4 5.3 3.7*  HGB 11.6* 12.9* 11.6*  HCT 35.6* 38.1* 34.2*  MCV 89.2 86.2 85.7  PLT 158 148* 121*    Basic Metabolic Panel: Recent Labs  Lab 01/26/20 2104 01/29/20 1329 01/30/20 0730 01/31/20 0427  NA 137 133* 138 136  K 3.5 3.6 3.4* 3.7  CL 104 99 105 108  CO2 21* 19* 18* 20*  GLUCOSE 105* 130* 151* 254*  BUN 45* 52* 59* 51*  CREATININE 2.28* 3.04* 2.67* 2.36*  CALCIUM 8.3* 8.7* 8.2* 8.1*     Liver Function  Tests: Recent Labs  Lab 01/26/20 2104 01/30/20 0730 01/31/20 0427  AST 27 40 40  ALT 18 21 23   ALKPHOS 74 60 52  BILITOT 0.7 0.6 0.4  PROT 7.3 6.7 6.1*  ALBUMIN 3.5 3.0* 2.6*     Antibiotics: Anti-infectives (From admission, onward)   Start     Dose/Rate Route Frequency Ordered Stop   01/31/20 1000  remdesivir 100 mg in sodium chloride 0.9 % 100 mL IVPB       "Followed by" Linked Group Details   100 mg 200 mL/hr over 30 Minutes Intravenous Daily 01/30/20 0004 02/04/20 0959   01/30/20 0130  remdesivir 200 mg in sodium chloride 0.9% 250 mL IVPB       "Followed by" Linked Group Details    200 mg 580 mL/hr over 30 Minutes Intravenous Once 01/30/20 0004 01/30/20 0342       DVT prophylaxis: Lovenox  Code Status: Full code  Family Communication: No family at bedside    Status is: Inpatient  Dispo: The patient is from: Home              Anticipated d/c is to: Home              Anticipated d/c date is: 02/03/2020              Patient currently not medically stable for discharge  Barrier to discharge-COVID-19 pneumonia      Consultants:    Procedures:     Objective   Vitals:   01/31/20 2215 02/01/20 0530 02/01/20 0829 02/01/20 1148  BP:  (!) 155/60 (!) 144/96 (!) 155/80  Pulse: 63 (!) 54 60 (!) 53  Resp:  16 18 16   Temp:  98.1 F (36.7 C) (!) 97.5 F (36.4 C) (!) 97.2 F (36.2 C)  TempSrc:  Oral Oral Axillary  SpO2:  99% 98% 98%  Weight:      Height:        Intake/Output Summary (Last 24 hours) at 02/01/2020 1402 Last data filed at 02/01/2020 1221 Gross per 24 hour  Intake 375 ml  Output 1575 ml  Net -1200 ml    08/19 1901 - 08/21 0700 In: 2375 [I.V.:2275] Out: 2710 [Urine:2710]  Filed Weights   01/29/20 1324 01/30/20 2240  Weight: 136.1 kg (!) 137 kg    Physical Examination:  General-appears in no acute distress Heart-S1-S2, regular, no murmur auscultated Lungs-clear to auscultation bilaterally, no wheezing or crackles auscultated Abdomen-soft, nontender, no organomegaly Extremities-no edema in the lower extremities Neuro-alert, oriented x3, no focal deficit noted  Data Reviewed:   Recent Results (from the past 240 hour(s))  SARS Coronavirus 2 by RT PCR (hospital order, performed in [facility] hospital lab) Nasopharyngeal Nasopharyngeal Swab     Status: Abnormal   Collection Time: 01/29/20 10:27 PM   Specimen: Nasopharyngeal Swab  Result Value Ref Range Status   SARS Coronavirus 2 POSITIVE (A) NEGATIVE Final    Comment: RESULT CALLED TO, READ BACK BY AND VERIFIED WITH: Nadeen Landau [id number] ON 01/29/20  SKL (NOTE) SARS-CoV-2 target nucleic acids are DETECTED  SARS-CoV-2 RNA is generally detectable in upper respiratory specimens  during the acute phase of infection.  Positive results are indicative  of the presence of the identified virus, but do not rule out bacterial infection or co-infection with other pathogens not detected by the test.  Clinical correlation with patient history and  other diagnostic information is necessary to determine patient infection status.  The expected result is negative.  Fact Sheet for Patients:   StrictlyIdeas.no   Fact Sheet for Healthcare Providers:   BankingDealers.co.za    This test is not yet approved or cleared by the [place] FDA and  has been authorized for detection and/or diagnosis of SARS-CoV-2 by FDA under an Emergency Use Authorization (EUA).  This EUA will remain in effect (meaning this  test can be used) for the duration of  the COVID-19 declaration under Section 564(b)(1) of the Act, 21 U.S.C. section 360-bbb-3(b)(1), unless the authorization is terminated or revoked sooner.  Performed at [facility], [address] [place], [place] [address]     BNP (last 3 results) Recent Labs    11/10/19 1928 12/21/19 1111 01/24/20 1431  BNP 23.1 44.5 94.5       [place]   [facility] If 7PM-7AM, please contact night-coverage at [email or web address], Office  [phone]   02/01/2020, 2:02 PM  LOS: 3 days

## 2020-02-02 LAB — BASIC METABOLIC PANEL
Anion gap: 10 (ref 5–15)
BUN: 47 mg/dL — ABNORMAL HIGH (ref 8–23)
CO2: 20 mmol/L — ABNORMAL LOW (ref 22–32)
Calcium: 8.5 mg/dL — ABNORMAL LOW (ref 8.9–10.3)
Chloride: 108 mmol/L (ref 98–111)
Creatinine, Ser: 1.67 mg/dL — ABNORMAL HIGH (ref 0.61–1.24)
GFR calc Af Amer: 49 mL/min — ABNORMAL LOW (ref >60)
GFR calc non Af Amer: 42 mL/min — ABNORMAL LOW (ref >60)
Glucose, Bld: 186 mg/dL — ABNORMAL HIGH (ref 70–99)
Potassium: 4.1 mmol/L (ref 3.5–5.1)
Sodium: 138 mmol/L (ref 135–145)

## 2020-02-02 LAB — FIBRIN DERIVATIVES D-DIMER (ARMC ONLY): Fibrin derivatives D-dimer (ARMC): 625.79 ng/mL (FEU) — ABNORMAL HIGH (ref 0.00–499.00)

## 2020-02-02 LAB — GLUCOSE, CAPILLARY
Glucose-Capillary: 143 mg/dL — ABNORMAL HIGH (ref 70–99)
Glucose-Capillary: 182 mg/dL — ABNORMAL HIGH (ref 70–99)
Glucose-Capillary: 276 mg/dL — ABNORMAL HIGH (ref 70–99)
Glucose-Capillary: 259 mg/dL — ABNORMAL HIGH (ref 70–99)

## 2020-02-02 LAB — FERRITIN: Ferritin: 205 ng/mL (ref 24–336)

## 2020-02-02 LAB — C-REACTIVE PROTEIN: CRP: 1 mg/dL — ABNORMAL HIGH (ref <1.0)

## 2020-02-02 NOTE — Progress Notes (Signed)
[facility]  PROGRESS NOTE  Larry Callahan [id number]:[id number] DOB: 11-Jan-1956 DOA: 01/29/2020 PCP: [name], NP   Brief HPI:   64 year old male with medical history of hypertension, diabetes mellitus type 2, CKD stage IIIb, COPD, systolic CHF, recently hospitalized from 01/09/2020 to 01/10/2020 with hematemesis believed to be NSAID induced normal EGD in June 2021 presented with weakness.  Patient says that he did not have any hematemesis or blood in the stool.  He does report a productive cough and reports Covid exposure of riding to church with a friend who had Covid 2 weeks ago.  He denied fever or chills.  In the ED SARS-CoV-2 test was positive.  Chest x-ray showed perihilar airspace disease consistent with multifocal pneumonia or edema.  Subjective   Patient seen and examined, denies shortness of breath.  Currently requiring 2 L/min of oxygen via nasal cannula.   Assessment/Plan:    1. Multifocal pneumonia-patient has positive SARS-CoV-2 RT-PCR.  Started on remdesivir per pharmacy consultation.  Continue Decadron 6 mg daily.  CRP is down to 3.3,  follow CRP, D-dimer, ferritin in a.m.   Continue vitamin C, zinc sulfate. 2. Presyncope/acute kidney injury neuro oriented-likely from acute kidney injury superimposed on CKD stage IIIb.  Patient's creatinine was 3.04 with baseline 1.8.  Started on IV hydration.  Creatinine slowly improving.  Today creatinine is 1.67 almost at baseline.  Follow BMP in am. 3. Hypertension-blood pressure is now elevated, restart metoprolol, amlodipine.  Will start hydralazine 25 mg every 6 hours as needed for BP greater than 160/100. 4. Diabetes mellitus type 2-continue linagliptin, which has been found to improve mortality in COVID-19 patients.  Continue sliding scale insulin NovoLog.  CBG well controlled. 5. COPD-no acute exacerbation, continue inhalers.   COVID-19 Labs  Recent Labs    01/31/20 0427 02/01/20 0521 02/02/20 0630  FERRITIN 243  242 205  CRP 7.4* 3.3*  --     Lab Results  Component Value Date   SARSCOV2NAA POSITIVE (A) 01/29/2020   SARSCOV2NAA NEGATIVE 01/09/2020   [place] NEGATIVE 11/19/2019   [place] NEGATIVE 08/08/2019     Scheduled medications:   . albuterol  2 puff Inhalation Q6H  . amLODipine  10 mg Oral Daily  . vitamin C  500 mg Oral Daily  . dexamethasone  6 mg Oral Daily  . enoxaparin (LOVENOX) injection  40 mg Subcutaneous Q12H  . insulin aspart  0-20 Units Subcutaneous TID WC  . insulin aspart  0-5 Units Subcutaneous QHS  . linagliptin  5 mg Oral Daily  . metoprolol succinate  50 mg Oral QHS  . multivitamin with minerals  1 tablet Oral Daily  . zinc sulfate  220 mg Oral Daily         CBG: Recent Labs  Lab 02/01/20 1144 02/01/20 1630 02/01/20 2049 02/02/20 0837 02/02/20 1228  GLUCAP 146* 284* 321* 143* 182*    SpO2: 99 % O2 Flow Rate (L/min): 2 L/min    CBC: Recent Labs  Lab 01/26/20 2104 01/29/20 1329 01/30/20 0730  WBC 4.4 5.3 3.7*  HGB 11.6* 12.9* 11.6*  HCT 35.6* 38.1* 34.2*  MCV 89.2 86.2 85.7  PLT 158 148* 121*    Basic Metabolic Panel: Recent Labs  Lab 01/26/20 2104 01/29/20 1329 01/30/20 0730 01/31/20 0427 02/02/20 0630  NA 137 133* 138 136 138  K 3.5 3.6 3.4* 3.7 4.1  CL 104 99 105 108 108  CO2 21* 19* 18* 20* 20*  GLUCOSE 105* 130* 151* 254* 186*  BUN 45* 52* 59* 51* 47*  CREATININE 2.28* 3.04* 2.67* 2.36* 1.67*  CALCIUM 8.3* 8.7* 8.2* 8.1* 8.5*     Liver Function Tests: Recent Labs  Lab 01/26/20 2104 01/30/20 0730 01/31/20 0427  AST 27 40 40  ALT 18 21 23   ALKPHOS 74 60 52  BILITOT 0.7 0.6 0.4  PROT 7.3 6.7 6.1*  ALBUMIN 3.5 3.0* 2.6*     Antibiotics: Anti-infectives (From admission, onward)   Start     Dose/Rate Route Frequency Ordered Stop   01/31/20 1000  remdesivir 100 mg in sodium chloride 0.9 % 100 mL IVPB       "Followed by" Linked Group Details   100 mg 200 mL/hr over 30 Minutes Intravenous Daily 01/30/20  0004 02/04/20 0959   01/30/20 0130  remdesivir 200 mg in sodium chloride 0.9% 250 mL IVPB       "Followed by" Linked Group Details   200 mg 580 mL/hr over 30 Minutes Intravenous Once 01/30/20 0004 01/30/20 0342       DVT prophylaxis: Lovenox  Code Status: Full code  Family Communication: No family at bedside    Status is: Inpatient  Dispo: The patient is from: Home              Anticipated d/c is to: Home              Anticipated d/c date is: 02/03/2020              Patient currently not medically stable for discharge  Barrier to discharge-COVID-19 pneumonia      Consultants:    Procedures:     Objective   Vitals:   02/01/20 2047 02/02/20 0511 02/02/20 0834 02/02/20 1232  BP: 138/67 (!) 174/80 (!) 170/81 (!) 154/96  Pulse: (!) 58 (!) 48 (!) 54 (!) 56  Resp: 16 18 18 18   Temp: 97.6 F (36.4 C) 98.4 F (36.9 C) (!) 97.3 F (36.3 C) (!) 97.4 F (36.3 C)  TempSrc: Oral Oral Oral Oral  SpO2: 94% 95% 95% 99%  Weight:      Height:        Intake/Output Summary (Last 24 hours) at 02/02/2020 1421 Last data filed at 02/02/2020 1252 Gross per 24 hour  Intake 276.65 ml  Output 1150 ml  Net -873.35 ml    08/20 1901 - 08/22 0700 In: 100  Out: 1925 [Urine:1925]  Filed Weights   01/29/20 1324 01/30/20 2240  Weight: 136.1 kg (!) 137 kg    Physical Examination:  General-appears in no acute distress Heart-S1-S2, regular, no murmur auscultated Lungs-clear to auscultation bilaterally, no wheezing or crackles auscultated Abdomen-soft, nontender, no organomegaly Extremities-no edema in the lower extremities Neuro-alert, oriented x3, no focal deficit noted  Data Reviewed:   Recent Results (from the past 240 hour(s))  SARS Coronavirus 2 by RT PCR (hospital order, performed in [place] hospital lab) Nasopharyngeal Nasopharyngeal Swab     Status: Abnormal   Collection Time: 01/29/20 10:27 PM   Specimen: Nasopharyngeal Swab  Result Value Ref Range Status    SARS Coronavirus 2 POSITIVE (A) NEGATIVE Final    Comment: RESULT CALLED TO, READ BACK BY AND VERIFIED WITH: Nadeen Landau [id number] ON 01/29/20 SKL (NOTE) SARS-CoV-2 target nucleic acids are DETECTED  SARS-CoV-2 RNA is generally detectable in upper respiratory specimens  during the acute phase of infection.  Positive results are indicative  of the presence of the identified virus, but do not rule out bacterial infection or co-infection with other  pathogens not detected by the test.  Clinical correlation with patient history and  other diagnostic information is necessary to determine patient infection status.  The expected result is negative.  Fact Sheet for Patients:   StrictlyIdeas.no   Fact Sheet for Healthcare Providers:   BankingDealers.co.za    This test is not yet approved or cleared by the [place] FDA and  has been authorized for detection and/or diagnosis of SARS-CoV-2 by FDA under an Emergency Use Authorization (EUA).  This EUA will remain in effect (meaning this  test can be used) for the duration of  the COVID-19 declaration under Section 564(b)(1) of the Act, 21 U.S.C. section 360-bbb-3(b)(1), unless the authorization is terminated or revoked sooner.  Performed at [facility], [address] [place], [place] [address]     BNP (last 3 results) Recent Labs    11/10/19 1928 12/21/19 1111 01/24/20 1431  BNP 23.1 44.5 94.5       [place]   [facility] If 7PM-7AM, please contact night-coverage at [email or web address], Office  [phone]   02/02/2020, 2:21 PM  LOS: 4 days

## 2020-02-03 DIAGNOSIS — R42 Dizziness and giddiness: Secondary | ICD-10-CM

## 2020-02-03 DIAGNOSIS — R55 Syncope and collapse: Secondary | ICD-10-CM

## 2020-02-03 LAB — FERRITIN: Ferritin: 185 ng/mL (ref 24–336)

## 2020-02-03 LAB — C-REACTIVE PROTEIN: CRP: 0.9 mg/dL (ref <1.0)

## 2020-02-03 LAB — FIBRIN DERIVATIVES D-DIMER (ARMC ONLY): Fibrin derivatives D-dimer (ARMC): 584.71 ng/mL (FEU) — ABNORMAL HIGH (ref 0.00–499.00)

## 2020-02-03 LAB — GLUCOSE, CAPILLARY
Glucose-Capillary: 232 mg/dL — ABNORMAL HIGH (ref 70–99)
Glucose-Capillary: 235 mg/dL — ABNORMAL HIGH (ref 70–99)

## 2020-02-03 MED ORDER — ASCORBIC ACID 500 MG PO TABS
500.0000 mg | ORAL_TABLET | Freq: Every day | ORAL | 0 refills | Status: DC
Start: 2020-02-04 — End: 2020-10-08

## 2020-02-03 MED ORDER — HYDRALAZINE HCL 50 MG PO TABS
100.0000 mg | ORAL_TABLET | Freq: Three times a day (TID) | ORAL | Status: DC
  Administered 2020-02-03: 13:00:00 100 mg via ORAL
  Filled 2020-02-03: qty 2

## 2020-02-03 MED ORDER — LINAGLIPTIN 5 MG PO TABS
5.0000 mg | ORAL_TABLET | Freq: Every day | ORAL | 0 refills | Status: DC
Start: 2020-02-04 — End: 2020-07-17

## 2020-02-03 MED ORDER — HYDRALAZINE HCL 100 MG PO TABS: 100.0000 mg | ORAL_TABLET | Freq: Three times a day (TID) | ORAL | Status: DC

## 2020-02-03 MED ORDER — DEXAMETHASONE 6 MG PO TABS: 6.0000 mg | ORAL_TABLET | Freq: Every day | ORAL | 0 refills | Status: AC

## 2020-02-03 MED ORDER — ZINC SULFATE 220 (50 ZN) MG PO CAPS: 220.0000 mg | ORAL_CAPSULE | Freq: Every day | ORAL | 0 refills | Status: DC

## 2020-02-03 NOTE — Care Management Important Message (Signed)
Important Message  Patient Details  Name: Larry Callahan MRN: [id number] Date of Birth: 05/30/1956   Medicare Important Message Given:  Yes  Reviewed with patient via room phone due to isolation status.    Dannette Barbara 02/03/2020, 1:17 PM

## 2020-02-03 NOTE — Discharge Summary (Signed)
Physician Discharge Summary  Larry Callahan [id number] DOB: Apr 05, 1956 DOA: 01/29/2020  PCP: [name], NP  Admit date: 01/29/2020 Discharge date: 02/03/2020  Time spent: 50  minutes  Recommendations for Outpatient Follow-up:  1. Follow-up PCP in 3 weeks 2. Home isolation for 3 weeks up till 02/19/2020   Discharge Diagnoses:  Principal Problem:   Generalized weakness Active Problems:   Morbid (severe) obesity due to excess calories (HCC)   COPD (chronic obstructive pulmonary disease) (HCC)   Acute kidney injury superimposed on CKD 3b (HCC)   Multifocal pneumonia   Type 2 diabetes mellitus with stage 3b chronic kidney disease, with long-term current use of insulin (HCC)   Elevated troponin   Suspected COVID-19 virus infection   Postural dizziness with presyncope   Pneumonia due to COVID-19 virus   Discharge Condition: Stable  Diet recommendation: Heart healthy diet  Filed Weights   01/29/20 1324 01/30/20 2240  Weight: 136.1 kg (!) 137 kg    History of present illness:  64 year old male with medical history of hypertension, diabetes mellitus type 2, CKD stage IIIb, COPD, systolic CHF, recently hospitalized from 01/09/2020 to 01/10/2020 with hematemesis believed to be NSAID induced normal EGD in June 2021 presented with weakness.  Patient says that he did not have any hematemesis or blood in the stool.  He does report a productive cough and reports Covid exposure of riding to church with a friend who had Covid 2 weeks ago.  He denied fever or chills.  In the ED SARS-CoV-2 test was positive.  Chest x-ray showed perihilar airspace disease consistent with multifocal pneumonia or edema.  Hospital Course:  1. Multifocal pneumonia-patient has positive SARS-CoV-2 RT-PCR.    He was started on remdesivir per pharmacy and has completed 5 days of treatment.  He was also started on Decadron 6 mg daily.  We will discharge him on Decadron 6 mg daily for 5 more days to complete 10  days of treatment.  CRP is down to 0.9,  Patient is no longer requiring oxygen.  His oxygen saturation is 96% on room air. 2. Presyncope/acute kidney injury neuro oriented-likely from acute kidney injury superimposed on CKD stage IIIb.  Patient's creatinine was 3.04 with baseline 1.8.  Started on IV hydration.  Creatinine slowly improving.  Today creatinine is 1.67 almost at baseline.    He is advised to hold Lasix until he sees his primary care doctor. 3. Hypertension-blood pressure is now elevated, restart metoprolol, amlodipine and hydralazine. 4. Diabetes mellitus type 2-continue linagliptin, which has been found to improve mortality in COVID-19 patients.    Will discharge on linagliptin 5 mg daily for 30 days.  He can follow-up with PCP for further adjustment of medications.  Continue home regimen of insulin. 5. COPD-no acute exacerbation, continue inhalers.  COVID-19 Labs  Recent Labs    02/01/20 0521 02/02/20 0630 02/03/20 0556  FERRITIN 242 205 185  CRP 3.3* 1.0* 0.9    Lab Results  Component Value Date   SARSCOV2NAA POSITIVE (A) 01/29/2020   SARSCOV2NAA NEGATIVE 01/09/2020   [place] NEGATIVE 11/19/2019   [place] NEGATIVE 08/08/2019     Procedures:  Consultations:    Discharge Exam: Vitals:   02/03/20 0834 02/03/20 1237  BP: (!) 160/90 (!) 175/86  Pulse: (!) 53 (!) 55  Resp: 16 20  Temp: 98.1 F (36.7 C) (!) 97.4 F (36.3 C)  SpO2: 93% 97%    General: Appears in no acute distress Cardiovascular: S1-S2, regular, no murmur auscultated Respiratory: Clear  to auscultation bilaterally, no wheezing or crackles auscultated.  Discharge Instructions   Discharge Instructions    Diet - low sodium heart healthy   Complete by: As directed    Discharge instructions   Complete by: As directed    Home isolation for 3 weeks starting from 01/29/2020 obtain 02/19/2020.   Increase activity slowly   Complete by: As directed      Allergies as of 02/03/2020       Reactions   Penicillins Rash      Medication List    TAKE these medications   acetaminophen 325 MG tablet Commonly known as: TYLENOL Take 2 tablets (650 mg total) by mouth every 4 (four) hours as needed for headache or mild pain.   albuterol 108 (90 Base) MCG/ACT inhaler Commonly known as: VENTOLIN HFA Inhale 2 puffs into the lungs every 6 (six) hours as needed for wheezing or shortness of breath.   amLODipine 10 MG tablet Commonly known as: NORVASC Take 1 tablet (10 mg total) by mouth daily.   ascorbic acid 500 MG tablet Commonly known as: VITAMIN C Take 1 tablet (500 mg total) by mouth daily. Start taking on: February 04, 2020   benzonatate 100 MG capsule Commonly known as: Best boy Take 1 capsule (100 mg total) by mouth 3 (three) times daily as needed for cough.   dexamethasone 6 MG tablet Commonly known as: DECADRON Take 1 tablet (6 mg total) by mouth daily for 5 days. Start taking on: February 04, 2020   ferrous gluconate 324 MG tablet Commonly known as: FERGON Take 1 tablet (324 mg total) by mouth daily with breakfast.   furosemide 80 MG tablet Commonly known as: LASIX Hold until outpatient PCP followup due to acute kidney injury.   gabapentin 100 MG capsule Commonly known as: NEURONTIN Take 1 capsule (100 mg total) by mouth in the morning, at noon, and at bedtime.   gemfibrozil 600 MG tablet Commonly known as: LOPID Take 1 tablet (600 mg total) by mouth 2 (two) times daily with a meal.   glimepiride 2 MG tablet Commonly known as: AMARYL Take 1 tablet (2 mg total) by mouth at bedtime.   hydrALAZINE 100 MG tablet Commonly known as: APRESOLINE Take 1 tablet (100 mg total) by mouth 3 (three) times daily.   hydrOXYzine 10 MG/5ML syrup Commonly known as: ATARAX Take 5 mLs (10 mg total) by mouth 3 (three) times daily as needed for itching.   insulin glargine (1 Unit Dial) 300 UNIT/ML Solostar Pen Commonly known as: TOUJEO Inject 26 Units into the skin  daily.   ipratropium-albuterol 0.5-2.5 (3) MG/3ML Soln Commonly known as: DUONEB Take 3 mLs by nebulization every 6 (six) hours as needed.   isosorbide mononitrate 30 MG 24 hr tablet Commonly known as: IMDUR Take 1 tablet (30 mg total) by mouth daily.   linagliptin 5 MG Tabs tablet Commonly known as: TRADJENTA Take 1 tablet (5 mg total) by mouth daily. Start taking on: February 04, 2020   loratadine 10 MG tablet Commonly known as: CLARITIN Take 1 tablet (10 mg total) by mouth daily as needed for allergies.   metoprolol succinate 50 MG 24 hr tablet Commonly known as: TOPROL-XL Take 1 tablet (50 mg total) by mouth at bedtime. Take with or immediately following a meal.   nitroGLYCERIN 0.4 MG SL tablet Commonly known as: NITROSTAT Place 1 tablet (0.4 mg total) under the tongue every 5 (five) minutes x 3 doses as needed for chest pain.  NovoFine 32G X 6 MM Misc Generic drug: Insulin Pen Needle To use with [place] injections daily   omeprazole 40 MG capsule Commonly known as: PRILOSEC Take 1 capsule (40 mg total) by mouth in the morning and at bedtime.   potassium chloride SA 20 MEQ tablet Commonly known as: KLOR-CON Take 1 tablet (20 mEq total) by mouth 2 (two) times daily.   rosuvastatin 10 MG tablet Commonly known as: CRESTOR Take 1 tablet (10 mg total) by mouth daily.   Spiriva HandiHaler 18 MCG inhalation capsule Generic drug: tiotropium Place 1 capsule (18 mcg total) into inhaler and inhale daily.   sucralfate 1 g tablet Commonly known as: CARAFATE Take 1 tablet (1 g total) by mouth 4 (four) times daily -  with meals and at bedtime.   True Metrix Air Glucose Meter Devi 1 Device by Does not apply route 2 (two) times daily.   True Metrix Blood Glucose Test test strip Generic drug: glucose blood Use as instructed twice daily diag E11.65   True Metrix Level 1 Low Soln Use as directed   TRUEplus Lancets 33G Misc Use as directed twice daily diag E11.65   vitamin  B-12 1000 MCG tablet Commonly known as: CYANOCOBALAMIN Take 1 tablet (1,000 mcg total) by mouth daily.   zinc sulfate 220 (50 Zn) MG capsule Take 1 capsule (220 mg total) by mouth daily. Start taking on: February 04, 2020      Allergies  Allergen Reactions  . Penicillins Rash      The results of significant diagnostics from this hospitalization (including imaging, microbiology, ancillary and laboratory) are listed below for reference.    Significant Diagnostic Studies: DG Chest 2 View  Result Date: 01/26/2020 CLINICAL DATA:  Weakness. EXAM: CHEST - 2 VIEW COMPARISON:  January 24, 2020 FINDINGS: Mild, diffuse chronic appearing increased lung markings are seen. There is no evidence of acute infiltrate, pleural effusion or pneumothorax. The heart size and mediastinal contours are within normal limits. The visualized skeletal structures are unremarkable. IMPRESSION: No active cardiopulmonary disease. Electronically Signed   By: Virgina Norfolk M.D.   On: 01/26/2020 21:54   DG Chest 2 View  Result Date: 01/24/2020 CLINICAL DATA:  Here for chest pain and cough, burning in chest. Reports coughing up "fluid". Unlabored at this time, low grade temp present. Pt is chf pt EXAM: CHEST - 2 VIEW COMPARISON:  01/08/2020 FINDINGS: Cardiac silhouette top-normal in size. No mediastinal or hilar masses. No evidence of adenopathy. Prominent vascular markings similar to the prior study. Lungs otherwise clear with no evidence of pulmonary edema. No pleural effusion or pneumothorax. Skeletal structures are grossly intact. IMPRESSION: No active cardiopulmonary disease. Electronically Signed   By: Lajean Manes M.D.   On: 01/24/2020 15:01   DG Chest 2 View  Result Date: 01/08/2020 CLINICAL DATA:  Shortness of breath, cough EXAM: CHEST - 2 VIEW COMPARISON:  12/21/2019 FINDINGS: Heart and mediastinal contours are within normal limits. No focal opacities or effusions. No acute bony abnormality. IMPRESSION: No  active cardiopulmonary disease. Electronically Signed   By: Rolm Baptise M.D.   On: 01/08/2020 09:50   CT Angio Chest PE W and/or Wo Contrast  Result Date: 01/24/2020 CLINICAL DATA:  Shortness of breath, chest pain EXAM: CT ANGIOGRAPHY CHEST WITH CONTRAST TECHNIQUE: Multidetector CT imaging of the chest was performed using the standard protocol during bolus administration of intravenous contrast. Multiplanar CT image reconstructions and MIPs were obtained to evaluate the vascular anatomy. CONTRAST:  17mL OMNIPAQUE IOHEXOL 350  MG/ML SOLN COMPARISON:  11/19/2019 FINDINGS: Cardiovascular: No filling defects in the pulmonary arteries to suggest pulmonary emboli. Heart is normal size. Densely calcified areas within the left anterior descending coronary artery. Aortic calcifications. No aneurysm. Mild tortuosity. Mediastinum/Nodes: No mediastinal, hilar, or axillary adenopathy. Trachea and esophagus are unremarkable. Thyroid unremarkable. Lungs/Pleura: No confluent opacities or effusions. Upper Abdomen: Calcifications in the spleen compatible with old granulomatous disease. Musculoskeletal: Chest wall soft tissues are unremarkable. No acute bony abnormality. Review of the MIP images confirms the above findings. IMPRESSION: No evidence of pulmonary embolus. Coronary artery disease. No acute findings. Aortic Atherosclerosis (ICD10-I70.0). Electronically Signed   By: Rolm Baptise M.D.   On: 01/24/2020 22:28   DG Chest Portable 1 View  Result Date: 01/29/2020 CLINICAL DATA:  Cough, weakness, syncope EXAM: PORTABLE CHEST 1 VIEW COMPARISON:  01/26/2020 FINDINGS: Single frontal view of the chest demonstrates a stable cardiac silhouette. There is been interval development of bilateral patchy perihilar airspace disease, which could reflect multifocal pneumonia or edema. No effusion or pneumothorax. No acute bony abnormalities. IMPRESSION: 1. Interval development of bilateral perihilar airspace disease, consistent with  multifocal pneumonia or edema. Electronically Signed   By: Randa Ngo M.D.   On: 01/29/2020 22:34    Microbiology: Recent Results (from the past 240 hour(s))  SARS Coronavirus 2 by RT PCR (hospital order, performed in [facility] hospital lab) Nasopharyngeal Nasopharyngeal Swab     Status: Abnormal   Collection Time: 01/29/20 10:27 PM   Specimen: Nasopharyngeal Swab  Result Value Ref Range Status   SARS Coronavirus 2 POSITIVE (A) NEGATIVE Final    Comment: RESULT CALLED TO, READ BACK BY AND VERIFIED WITH: Nadeen Landau [id number] ON 01/29/20 SK[facility] (NOTE) SARS-CoV-2 target nucleic acids are DETECTED  SARS-CoV-2 RNA is generally detectable in upper respiratory specimens  during the acute phase of infection.  Positive results are indicative  of the presence of the identified virus, but do not rule out bacterial infection or co-infection with other pathogens not detected by the test.  Clinical correlation with patient history and  other diagnostic information is necessary to determine patient infection status.  The expected result is negative.  Fact Sheet for Patients:   StrictlyIdeas.no   Fact Sheet for Healthcare Providers:   BankingDealers.co.za    This test is not yet approved or cleared by the [place] FDA and  has been authorized for detection and/or diagnosis of SARS-CoV-2 by FDA under an Emergency Use Authorization (EUA).  This EUA will remain in effect (meaning this  test can be used) for the duration of  the COVID-19 declaration under Section 564(b)(1) of the Act, 21 U.S.C. section 360-bbb-3(b)(1), unless the authorization is terminated or revoked sooner.  Performed at [facility], [address] [place], [place] [address]      Labs: Basic Metabolic Panel: Recent Labs  Lab 01/29/20 1329 01/30/20 0730 01/31/20 0427 02/02/20 0630  NA 133* 138 136 138  K 3.6 3.4* 3.7 4.1  CL 99 105 108 108  CO2 19*  18* 20* 20*  GLUCOSE 130* 151* 254* 186*  BUN 52* 59* 51* 47*  CREATININE 3.04* 2.67* 2.36* 1.67*  CALCIUM 8.7* 8.2* 8.1* 8.5*   Liver Function Tests: Recent Labs  Lab 01/30/20 0730 01/31/20 0427  AST 40 40  ALT 21 23  ALKPHOS 60 52  BILITOT 0.6 0.4  PROT 6.7 6.1*  ALBUMIN 3.0* 2.6*   No results for input(s): LIPASE, AMYLASE in the last 168 hours. No results for input(s):  AMMONIA in the last 168 hours. CBC: Recent Labs  Lab 01/29/20 1329 01/30/20 0730  WBC 5.3 3.7*  HGB 12.9* 11.6*  HCT 38.1* 34.2*  MCV 86.2 85.7  PLT 148* 121*   Cardiac Enzymes: No results for input(s): CKTOTAL, CKMB, CKMBINDEX, TROPONINI in the last 168 hours. BNP: BNP (last 3 results) Recent Labs    11/10/19 1928 12/21/19 1111 01/24/20 1431  BNP 23.1 44.5 94.5    ProBNP (last 3 results) No results for input(s): PROBNP in the last 8760 hours.  CBG: Recent Labs  Lab 02/02/20 1228 02/02/20 1718 02/02/20 2044 02/03/20 1003 02/03/20 1237  GLUCAP 182* 276* 259* 232* 235*       Signed:  Oswald Hillock MD.  [facility] 02/03/2020, 12:52 PM

## 2020-02-03 NOTE — Discharge Instructions (Signed)
Isolation at home for 3 weeks daily 02/19/2020    Prevent the Spread of COVID-19 if You Are Sick If you are sick with COVID-19 or think you might have COVID-19, follow the steps below to care for yourself and to help protect other people in your home and community. Stay home except to get medical care.  Stay home. Most people with COVID-19 have mild illness and are able to recover at home without medical care. Do not leave your home, except to get medical care. Do not visit public areas.    Take care of yourself. Get rest and stay hydrated. Take over-the-counter medicines, such as acetaminophen, to help you feel better.  Stay in touch with your doctor. Call before you get medical care. Be sure to get care if you have trouble breathing, or have any other emergency warning signs, or if you think it is an emergency.  Avoid public transportation, ride-sharing, or taxis. Separate yourself from other people and pets in your home.  As much as possible, stay in a specific room and away from other people and pets in your home. Also, you should use a separate bathroom, if available. If you need to be around other people or animals in or outside of the home, wear a mask. ? See COVID-19 and Animals if you have questions about USFirm.ch. ? Additional guidance is available for those living in close quarters. (http://www.turner-rogers.com/.html) and shared housing (TVStereos.ch). Monitor your symptoms.  Symptoms of COVID-19 include fever, cough, and shortness of breath but other symptoms may be present as well.  Follow care instructions from your healthcare provider and local health department. Your local health authorities will give instructions on checking your symptoms and reporting information. When to Seek Emergency  Medical Attention Look for emergency warning signs* for COVID-19. If someone is showing any of these signs, seek emergency medical care immediately:  Trouble breathing  Persistent pain or pressure in the chest  New confusion  Bluish lips or face  Inability to wake or stay awake *This list is not all possible symptoms. Please call your medical provider for any other symptoms that are severe or concerning to you. Call 911 or call ahead to your local emergency facility: Notify the operator that you are seeking care for someone who has or may have COVID-19. Call ahead before visiting your doctor.  Call ahead. Many medical visits for routine care are being postponed or done by phone or telemedicine.  If you have a medical appointment that cannot be postponed, call your doctor's office, and tell them you have or may have COVID-19. If you are sick, wear a mask over your nose and mouth.  You should wear a mask over your nose and mouth if you must be around other people or animals, including pets (even at home).  You don't need to wear the mask if you are alone. If you can't put on a mask (because of trouble breathing for example), cover your coughs and sneezes in some other way. Try to stay at least 6 feet away from other people. This will help protect the people around you.  Masks should not be placed on young children under age 77 years, anyone who has trouble breathing, or anyone who is not able to remove the mask without help. Note: During the COVID-19 pandemic, medical grade facemasks are reserved for healthcare workers and some first responders. You may need to make a mask using a scarf or bandana. Cover your coughs and sneezes.  Cover your mouth and nose with a tissue when you cough or sneeze.  Throw used tissues in a lined trash can.  Immediately wash your hands with soap and water for at least 20 seconds. If soap and water are not available, clean your hands with an alcohol-based  hand sanitizer that contains at least 60% alcohol. Clean your hands often.  Wash your hands often with soap and water for at least 20 seconds. This is especially important after blowing your nose, coughing, or sneezing; going to the bathroom; and before eating or preparing food.  Use hand sanitizer if soap and water are not available. Use an alcohol-based hand sanitizer with at least 60% alcohol, covering all surfaces of your hands and rubbing them together until they feel dry.  Soap and water are the best option, especially if your hands are visibly dirty.  Avoid touching your eyes, nose, and mouth with unwashed hands. Avoid sharing personal household items.  Do not share dishes, drinking glasses, cups, eating utensils, towels, or bedding with other people in your home.  Wash these items thoroughly after using them with soap and water or put them in the dishwasher. Clean all "high-touch" surfaces everyday.  Clean and disinfect high-touch surfaces in your "sick room" and bathroom. Let someone else clean and disinfect surfaces in common areas, but not your bedroom and bathroom.  If a caregiver or other person needs to clean and disinfect a sick person's bedroom or bathroom, they should do so on an as-needed basis. The caregiver/other person should wear a mask and wait as long as possible after the sick person has used the bathroom. High-touch surfaces include phones, remote controls, counters, tabletops, doorknobs, bathroom fixtures, toilets, keyboards, tablets, and bedside tables.  Clean and disinfect areas that may have blood, stool, or body fluids on them.  Use household cleaners and disinfectants. Clean the area or item with soap and water or another detergent if it is dirty. Then use a household disinfectant. ? Be sure to follow the instructions on the label to ensure safe and effective use of the product. Many products recommend keeping the surface wet for several minutes to ensure  germs are killed. Many also recommend precautions such as wearing gloves and making sure you have good ventilation during use of the product. ? Most EPA-registered household disinfectants should be effective. When you can be around others after you had or likely had COVID-19 When you can be around others (end home isolation) depends on different factors for different situations.  I think or know I had COVID-19, and I had symptoms ? You can be with others after  24 hours with no fever AND  Symptoms improved AND  10 days since symptoms first appeared ? Depending on your healthcare provider's advice and availability of testing, you might get tested to see if you still have COVID-19. If you will be tested, you can be around others when you have no fever, symptoms have improved, and you receive two negative test results in a row, at least 24 hours apart.  I tested positive for COVID-19 but had no symptoms ? If you continue to have no symptoms, you can be with others after:  10 days have passed since test ? Depending on your healthcare provider's advice and availability of testing, you might get tested to see if you still have COVID-19. If you will be tested, you can be around others after you receive two negative test results in a row, at least 24 hours  apart. ? If you develop symptoms after testing positive, follow the guidance above for "I think or know I had COVID, and I had symptoms." [email or web address] 01/22/2019 This information is not intended to replace advice given to you by your health care provider. Make sure you discuss any questions you have with your health care provider. Document Revised: 02/07/2019 Document Reviewed: 12/11/2018 Elsevier Patient Education  [place].

## 2020-02-07 ENCOUNTER — Encounter: Payer: Self-pay | Admitting: Emergency Medicine

## 2020-02-07 ENCOUNTER — Emergency Department
Admission: EM | Admit: 2020-02-07 | Discharge: 2020-02-07 | Disposition: A | Discharge status: Home / Self Care | Payer: Medicare PPO | Attending: Emergency Medicine | Admitting: Emergency Medicine

## 2020-02-07 ENCOUNTER — Other Ambulatory Visit: Payer: Self-pay

## 2020-02-07 DIAGNOSIS — E1165 Type 2 diabetes mellitus with hyperglycemia: Secondary | ICD-10-CM | POA: Insufficient documentation

## 2020-02-07 DIAGNOSIS — J449 Chronic obstructive pulmonary disease, unspecified: Secondary | ICD-10-CM | POA: Diagnosis not present

## 2020-02-07 DIAGNOSIS — E1122 Type 2 diabetes mellitus with diabetic chronic kidney disease: Secondary | ICD-10-CM | POA: Diagnosis not present

## 2020-02-07 DIAGNOSIS — I130 Hypertensive heart and chronic kidney disease with heart failure and stage 1 through stage 4 chronic kidney disease, or unspecified chronic kidney disease: Secondary | ICD-10-CM | POA: Diagnosis not present

## 2020-02-07 DIAGNOSIS — N1832 Chronic kidney disease, stage 3b: Secondary | ICD-10-CM | POA: Insufficient documentation

## 2020-02-07 DIAGNOSIS — Z87891 Personal history of nicotine dependence: Secondary | ICD-10-CM | POA: Diagnosis not present

## 2020-02-07 DIAGNOSIS — I2510 Atherosclerotic heart disease of native coronary artery without angina pectoris: Secondary | ICD-10-CM | POA: Diagnosis not present

## 2020-02-07 DIAGNOSIS — I5033 Acute on chronic diastolic (congestive) heart failure: Secondary | ICD-10-CM | POA: Diagnosis not present

## 2020-02-07 DIAGNOSIS — Z79899 Other long term (current) drug therapy: Secondary | ICD-10-CM | POA: Diagnosis not present

## 2020-02-07 DIAGNOSIS — R000 Tachycardia, unspecified: Secondary | ICD-10-CM | POA: Diagnosis not present

## 2020-02-07 DIAGNOSIS — U071 COVID-19: Secondary | ICD-10-CM | POA: Diagnosis not present

## 2020-02-07 DIAGNOSIS — D441 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: Secondary | ICD-10-CM | POA: Diagnosis not present

## 2020-02-07 DIAGNOSIS — T7840XA Allergy, unspecified, initial encounter: Secondary | ICD-10-CM | POA: Diagnosis not present

## 2020-02-07 DIAGNOSIS — E1142 Type 2 diabetes mellitus with diabetic polyneuropathy: Secondary | ICD-10-CM | POA: Diagnosis not present

## 2020-02-07 DIAGNOSIS — Z7984 Long term (current) use of oral hypoglycemic drugs: Secondary | ICD-10-CM | POA: Diagnosis not present

## 2020-02-07 DIAGNOSIS — L299 Pruritus, unspecified: Secondary | ICD-10-CM | POA: Insufficient documentation

## 2020-02-07 DIAGNOSIS — Z7951 Long term (current) use of inhaled steroids: Secondary | ICD-10-CM | POA: Diagnosis not present

## 2020-02-07 NOTE — Discharge Instructions (Addendum)
Follow-up with your primary care provider if any continued problems.  Continue with the vitamin C, zinc and dexamethasone as prescribed by your doctor.  Should you develop any severe shortness of breath or difficulty breathing return to the emergency department immediately.  Continue your regular medications as prescribed by your doctor.

## 2020-02-07 NOTE — ED Triage Notes (Signed)
Pt states was just discharged from hospital after being dx with pneumonia. Pt states was taking a vitamin that makes him have itchy, watery eyes and have eyes. Pt states only allergy he has is penicillin. Pt with no noted rash in triage. Pt states was told by his doctor that if he felt like he was discharged too quick he he had a right to fight it.

## 2020-02-07 NOTE — ED Notes (Signed)
Pt states that he's been having an allergic reaction to one of the medications he's been taking for covid. Pt states that his eyes and arms are burning and some itching and redness on his feet.

## 2020-02-07 NOTE — ED Notes (Signed)
First nurse note: Here via EMS from home was recently admitted to hosp 4 days due to covid, since dc, has been itching due to possible allergic reaction 133/77; 95% RA; HR 120; steroids, vit C and zinc and unsure which has caused the reaction. NAD.

## 2020-02-07 NOTE — ED Provider Notes (Signed)
[facility] Emergency Department Provider Note   ____________________________________________   First MD Initiated Contact with Patient 02/07/20 1441     (approximate)  I have reviewed the triage vital signs and the nursing notes.   HISTORY  Chief Complaint Allergic Reaction   HPI Larry Callahan is a 64 y.o. male is brought to the ED via EMS with complaint of itching due to possible allergic reaction to either dexamethasone, zinc or vitamin C.  Patient currently is taking these due to [place].  He states there is no changes with his Covid in relationship to when he was seen prior and hospitalized.  Patient reports that his eyes are itching but denies any increased difficulty breathing.  Patient has a history of COPD.  Rates pain as 0/10.     Past Medical History:  Diagnosis Date  . Allergy   . Coronary artery disease   . Diabetes mellitus without complication ([place])   . Diastolic CHF ([place])   . GERD (gastroesophageal reflux disease)   . Hyperlipidemia   . Hypertension   . Renal insufficiency   . Sleep apnea     Patient Active Problem List   Diagnosis Date Noted  . Pneumonia due to COVID-19 virus 01/30/2020  . Elevated troponin 01/29/2020  . Suspected COVID-19 virus infection 01/29/2020  . Postural dizziness with presyncope 01/29/2020  . Hematemesis 01/09/2020  . Hospital discharge follow-up 11/26/2019  . Cellulitis of left hand 09/26/2019  . Generalized weakness 08/08/2019  . BMI 35.0-35.9,adult 08/08/2019  . Acute right hip pain 06/17/2019  . Inability to ambulate due to hip 06/17/2019  . CKD stage 3 due to type 2 diabetes mellitus ([place])   . Hypervolemia   . Acute on chronic diastolic CHF (congestive heart failure) ([place])   . Type 2 diabetes mellitus with stage 3b chronic kidney disease, with long-term current use of insulin ([place])   . Hypomagnesemia   . Full code status 05/20/2019  . Pleuritic chest pain   . Multifocal pneumonia  05/19/2019  . Acute kidney injury superimposed on CKD 3b ([place]) 05/09/2019  . Hemoptysis 04/09/2019  . Shortness of breath 03/26/2019  . Uncontrolled type 2 diabetes mellitus with insulin therapy ([place]) 03/12/2019  . Acute on chronic heart failure with preserved ejection fraction (HFpEF) ([place])   . Chronic kidney disease, stage 3b   . Chronic systolic CHF (congestive heart failure) ([place]) 03/01/2019  . Diastolic dysfunction 40/76/8088  . Iron deficiency anemia 12/31/2018  . Atopic dermatitis 11/24/2018  . Screening for colon cancer 11/06/2017  . Uncontrolled type 2 diabetes mellitus with hyperglycemia ([place]) 11/06/2017  . Dermatitis, unspecified 06/09/2017  . Atherosclerotic heart disease of native coronary artery without angina pectoris 06/09/2017  . 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 Obstructive sleep apnea, adult 06/09/2017  . Morbid (severe) obesity due to excess calories ([place]) 06/09/2017  . Cigarette nicotine dependence with nicotine-induced disorder 06/09/2017  . Diabetic polyneuropathy associated with type 2 diabetes mellitus ([place]) 06/09/2017  . Allergic rhinitis due to pollen 06/09/2017  . Mixed hyperlipidemia 06/09/2017  . COPD (chronic obstructive pulmonary disease) ([place]) 06/09/2017  . Dyspnea on exertion 06/09/2017  . Wheezing 06/09/2017  . Dysuria 06/09/2017  . Snoring 06/09/2017  . Essential hypertension 06/09/2017  . Personal history of other malignant neoplasm of kidney 06/09/2017  . Pain in right hip 06/09/2017  . Impacted cerumen, bilateral 06/09/2017  . Secondary hyperparathyroidism, not elsewhere classified ([place]) 06/09/2017  . Type 2 diabetes mellitus with  hyperglycemia ([place]) 06/09/2017  . Tinea corporis 06/09/2017    Past Surgical History:  Procedure Laterality Date  . BACK SURGERY    . CARDIAC CATHETERIZATION    . CHOLECYSTECTOMY    . COLONOSCOPY WITH PROPOFOL N/A 04/12/2019   Procedure: COLONOSCOPY WITH PROPOFOL;   Surgeon: [name], MD;  Location: [facility] ENDOSCOPY;  Service: Gastroenterology;  Laterality: N/A;  . ESOPHAGOGASTRODUODENOSCOPY N/A 04/12/2019   Procedure: ESOPHAGOGASTRODUODENOSCOPY (EGD);  Surgeon: [name], MD;  Location: [facility]COPY;  Service: Gastroenterology;  Laterality: N/A;  . ESOPHAGOGASTRODUODENOSCOPY (EGD) WITH PROPOFOL N/A 11/21/2019   Procedure: ESOPHAGOGASTRODUODENOSCOPY (EGD) WITH PROPOFOL;  Surgeon: [name], MD;  Location: [facility] ENDOSCOPY;  Service: Gastroenterology;  Laterality: N/A;  . FLEXIBLE BRONCHOSCOPY Bilateral 05/17/2019   Procedure: FLEXIBLE BRONCHOSCOPY;  Surgeon: [name], MD;  Location: [name] ORS;  Service: Pulmonary;  Laterality: Bilateral;  . left arm surgery    . nephrectomy Left   . PARATHYROIDECTOMY    . RIGHT HEART CATH N/A 03/29/2019   Procedure: RIGHT HEART CATH;  Surgeon: [name], MD;  Location: [facility] LAB;  Service: Cardiovascular;  Laterality: N/A;    Prior to Admission medications   Medication Sig Start Date End Date Taking? Authorizing Provider  acetaminophen (TYLENOL) 325 MG tablet Take 2 tablets (650 mg total) by mouth every 4 (four) hours as needed for headache or mild pain. 03/05/19   Gouru, Illene Silver, MD  albuterol (VENTOLIN HFA) 108 (90 Base) MCG/ACT inhaler Inhale 2 puffs into the lungs every 6 (six) hours as needed for wheezing or shortness of breath. 03/05/19   Gouru, Illene Silver, MD  amLODipine (NORVASC) 10 MG tablet Take 1 tablet (10 mg total) by mouth daily. 08/01/19   [name], NP  ascorbic acid (VITAMIN C) 500 MG tablet Take 1 tablet (500 mg total) by mouth daily. 02/04/20   Oswald Hillock, MD  benzonatate (TESSALON PERLES) 100 MG capsule Take 1 capsule (100 mg total) by mouth 3 (three) times daily as needed for cough. 12/10/19 12/09/20  Carrie Mew, MD  Blood Glucose Calibration (TRUE METRIX LEVEL 1) Low SOLN Use as directed 04/04/18   [name], NP  Blood Glucose Monitoring  Suppl (TRUE METRIX AIR GLUCOSE METER) DEVI 1 Device by Does not apply route 2 (two) times daily. 04/13/18   [name], NP  dexamethasone (DECADRON) 6 MG tablet Take 1 tablet (6 mg total) by mouth daily for 5 days. 02/04/20 02/09/20  Oswald Hillock, MD  ferrous gluconate (FERGON) 324 MG tablet Take 1 tablet (324 mg total) by mouth daily with breakfast. 08/01/19   [name], NP  furosemide (LASIX) 80 MG tablet Hold until outpatient PCP followup due to acute kidney injury. 11/21/19   Enzo Bi, MD  gabapentin (NEURONTIN) 100 MG capsule Take 1 capsule (100 mg total) by mouth in the morning, at noon, and at bedtime. 11/26/19   [name], NP  gemfibrozil (LOPID) 600 MG tablet Take 1 tablet (600 mg total) by mouth 2 (two) times daily with a meal. 01/17/20   Boscia, Heather E, NP  glimepiride (AMARYL) 2 MG tablet Take 1 tablet (2 mg total) by mouth at bedtime. 08/02/19   Lavera Guise, MD  glucose blood (TRUE METRIX BLOOD GLUCOSE TEST) test strip Use as instructed twice daily diag E11.65 07/30/19   [name], NP  hydrALAZINE (APRESOLINE) 100 MG tablet Take 1 tablet (100 mg total) by mouth 3 (three) times daily. 01/06/20  [name], NP  hydrOXYzine (ATARAX) 10 MG/5ML syrup Take 5 mLs (10 mg total) by mouth 3 (three) times daily as needed for itching. 09/24/19   Sable Feil, PA-C  insulin glargine, 1 Unit Dial, (TOUJEO) 300 UNIT/ML Solostar Pen Inject 26 Units into the skin daily. 10/24/19   [name], NP  ipratropium-albuterol (DUONEB) 0.5-2.5 (3) MG/3ML SOLN Take 3 mLs by nebulization every 6 (six) hours as needed. 03/08/19   Kendell Bane, NP  isosorbide mononitrate (IMDUR) 30 MG 24 hr tablet Take 1 tablet (30 mg total) by mouth daily. 12/03/19   [name], MD  linagliptin (TRADJENTA) 5 MG TABS tablet Take 1 tablet (5 mg total) by mouth daily. 02/04/20   Oswald Hillock, MD  loratadine (CLARITIN) 10 MG tablet Take 1 tablet (10 mg total) by mouth daily as needed for  allergies. 11/21/19   Enzo Bi, MD  metoprolol succinate (TOPROL-XL) 50 MG 24 hr tablet Take 1 tablet (50 mg total) by mouth at bedtime. Take with or immediately following a meal. 07/10/19   Lavera Guise, MD  nitroGLYCERIN (NITROSTAT) 0.4 MG SL tablet Place 1 tablet (0.4 mg total) under the tongue every 5 (five) minutes x 3 doses as needed for chest pain. 05/24/19   Loletha Grayer, MD  NOVOFINE 32G X 6 MM MISC To use with [place] injections daily 12/13/18   [name], NP  omeprazole (PRILOSEC) 40 MG capsule Take 1 capsule (40 mg total) by mouth in the morning and at bedtime. 01/10/20 02/09/20  Wyvonnia Dusky, MD  potassium chloride SA (KLOR-CON) 20 MEQ tablet Take 1 tablet (20 mEq total) by mouth 2 (two) times daily. 11/26/19   [name], NP  rosuvastatin (CRESTOR) 10 MG tablet Take 1 tablet (10 mg total) by mouth daily. 08/23/19 11/21/19  [name], MD  sucralfate (CARAFATE) 1 g tablet Take 1 tablet (1 g total) by mouth 4 (four) times daily -  with meals and at bedtime. 01/10/20 02/09/20  Wyvonnia Dusky, MD  tiotropium (SPIRIVA HANDIHALER) 18 MCG inhalation capsule Place 1 capsule (18 mcg total) into inhaler and inhale daily. 08/07/19 11/19/19  [name], NP  TRUEplus Lancets 33G MISC Use as directed twice daily diag E11.65 07/30/19   [name], NP  vitamin B-12 (CYANOCOBALAMIN) 1000 MCG tablet Take 1 tablet (1,000 mcg total) by mouth daily. 11/21/19   Enzo Bi, MD  zinc sulfate 220 (50 Zn) MG capsule Take 1 capsule (220 mg total) by mouth daily. 02/04/20   Oswald Hillock, MD    Allergies Penicillins  Family History  Problem Relation Age of Onset  . Diabetes Mother   . Lung cancer Father   . Diabetes Sister   . Hypertension Sister   . Heart disease Sister   . Cancer Sister   . Bone cancer Brother     Social History Social History   Tobacco Use  . Smoking status: Former Smoker    Types: Cigarettes  . 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 Vaping Use: Never used  Substance Use Topics  . Alcohol use: No  . Drug use: No    Review of Systems Constitutional: Denies fever/chills Eyes: No visual changes.  Complaint of eyelids itching. ENT: No sore throat. Cardiovascular: Denies chest pain. Respiratory: Denies shortness of breath. Gastrointestinal: No abdominal pain.  No nausea, no vomiting.  No diarrhea.  Genitourinary: Negative for  dysuria. Skin: Negative for rash. Neurological: Negative for headaches, focal weakness or numbness. ____________________________________________   PHYSICAL EXAM:  VITAL SIGNS: ED Triage Vitals  Enc Vitals Group     BP 02/07/20 1305 98/65     Pulse Rate 02/07/20 1305 (!) 111     Resp 02/07/20 1305 20     Temp 02/07/20 1305 97.7 F (36.5 C)     Temp Source 02/07/20 1305 Oral     SpO2 02/07/20 1305 96 %     Weight 02/07/20 1306 (!) 302 lb 0.5 oz (137 kg)     Height 02/07/20 1306 _0  (1.803 m)     Head Circumference --      Peak Flow --      Pain Score 02/07/20 1306 0     Pain Loc --      Pain Edu? --      Excl. in [place]? --     Constitutional: Alert and oriented. Well appearing and in no acute distress.  Patient is tearful and rubbing his eyes.  Patient is able to talk in complete sentences without any respiratory difficulties. Eyes: Conjunctivae are normal. PERRL. EOMI. upper eyelids bilaterally are minimally erythematous but no edema is noted. Head: Atraumatic. Nose: No congestion/rhinnorhea. Neck: No stridor.   Cardiovascular: Normal rate, regular rhythm. Grossly normal heart sounds.  Good peripheral circulation. Respiratory: Normal respiratory effort.  No retractions. Lungs no wheezing or coughing was noted during the time of exam.  Patient was able to talk in complete sentences without any difficulty. Gastrointestinal: Soft and nontender. No distention.  Musculoskeletal: Patient is able to move upper lower extremities without any difficulty. Neurologic:  Normal  speech and language. No gross focal neurologic deficits are appreciated. No gait instability. Skin:  Skin is warm, dry and intact.  On examination of the skin there is no urticarial rash or erythema noted.  Patient does have hidradenitis in the axilla that is chronic. Psychiatric: Mood and affect are normal. Speech and behavior are normal.  ____________________________________________   LABS (all labs ordered are listed, but only abnormal results are displayed)  Labs Reviewed - No data to display ____________________________________________  EKG Deferred  ____________________________________________  RADIOLOGY  Official radiology report(s): No results found.  ____________________________________________   PROCEDURES  Procedure(s) performed (including Critical Care):  Procedures  ____________________________________________   INITIAL IMPRESSION / ASSESSMENT AND PLAN / ED COURSE  As part of my medical decision making, I reviewed the following data within the electronic MEDICAL RECORD NUMBER Notes from prior ED visits and [place] Controlled Substance Database  Larry Callahan was evaluated in Emergency Department on 02/07/2020 for the symptoms described in the history of present illness. He was evaluated in the context of the global COVID-19 pandemic, which necessitated consideration that the patient might be at risk for infection with the SARS-CoV-2 virus that causes COVID-19. Institutional protocols and algorithms that pertain to the evaluation of patients at risk for COVID-19 are in a state of rapid change based on information released by regulatory bodies including the CDC and federal and state organizations. These policies and algorithms were followed during the patient's care in the ED.  64 year old male presents to the ED via EMS with concerns of an allergic reaction due to either the vitamin C, zinc or dexamethasone that he is currently taking for Covid.  Had a positive Covid  test 9 days ago.  He was admitted to the hospital for 4 days.  He denies any new symptoms and is afebrile in  the ED.  O2 sat was 96% on room air.  Dr. Corky Downs was in to speak with the patient and assess since he saw the patient prior to his positive Covid test and admission.  Patient was reassured that his vital signs were stable.  Patient is to continue taking medication as prescribed by his doctor.  Patient then asked for something to drink, eat and a phone to call family members to pick him up.  He was told that should he develop any sudden changes or severe shortness of breath or difficulty breathing he is to return to the emergency department.   ____________________________________________   FINAL CLINICAL IMPRESSION(S) / ED DIAGNOSES  Final diagnoses:  Itching  COVID-19 virus infection     ED Discharge Orders    None       Note:  This document was prepared using Dragon voice recognition software and may include unintentional dictation errors.    Johnn Hai, PA-C 02/07/20 1554    Lavonia Drafts, MD 02/07/20 [id number]

## 2020-02-07 NOTE — ED Notes (Signed)
Pt given coke, crackers, and phone at this time. Will continue to monitor.

## 2020-02-11 ENCOUNTER — Telehealth (HOSPITAL_COMMUNITY): Payer: Self-pay

## 2020-02-11 NOTE — Telephone Encounter (Signed)
Attempted to contact.  No answer, left message.  Will continue to try to get in touch.   [place] [phone]

## 2020-02-12 ENCOUNTER — Encounter: Payer: Self-pay | Admitting: *Deleted

## 2020-02-12 ENCOUNTER — Other Ambulatory Visit: Payer: Self-pay

## 2020-02-12 ENCOUNTER — Emergency Department: Payer: Medicare PPO

## 2020-02-12 ENCOUNTER — Emergency Department
Admission: EM | Admit: 2020-02-12 | Discharge: 2020-02-12 | Disposition: A | Discharge status: Home / Self Care | Payer: Medicare PPO | Attending: Emergency Medicine | Admitting: Emergency Medicine

## 2020-02-12 DIAGNOSIS — I130 Hypertensive heart and chronic kidney disease with heart failure and stage 1 through stage 4 chronic kidney disease, or unspecified chronic kidney disease: Secondary | ICD-10-CM | POA: Diagnosis not present

## 2020-02-12 DIAGNOSIS — Z79899 Other long term (current) drug therapy: Secondary | ICD-10-CM | POA: Diagnosis not present

## 2020-02-12 DIAGNOSIS — E213 Hyperparathyroidism, unspecified: Secondary | ICD-10-CM | POA: Insufficient documentation

## 2020-02-12 DIAGNOSIS — Z87891 Personal history of nicotine dependence: Secondary | ICD-10-CM | POA: Insufficient documentation

## 2020-02-12 DIAGNOSIS — I5033 Acute on chronic diastolic (congestive) heart failure: Secondary | ICD-10-CM | POA: Diagnosis not present

## 2020-02-12 DIAGNOSIS — E1165 Type 2 diabetes mellitus with hyperglycemia: Secondary | ICD-10-CM | POA: Insufficient documentation

## 2020-02-12 DIAGNOSIS — J189 Pneumonia, unspecified organism: Secondary | ICD-10-CM | POA: Diagnosis not present

## 2020-02-12 DIAGNOSIS — U071 COVID-19: Secondary | ICD-10-CM | POA: Diagnosis not present

## 2020-02-12 DIAGNOSIS — R0602 Shortness of breath: Secondary | ICD-10-CM | POA: Diagnosis not present

## 2020-02-12 DIAGNOSIS — Z794 Long term (current) use of insulin: Secondary | ICD-10-CM | POA: Insufficient documentation

## 2020-02-12 DIAGNOSIS — N1832 Chronic kidney disease, stage 3b: Secondary | ICD-10-CM | POA: Insufficient documentation

## 2020-02-12 DIAGNOSIS — J449 Chronic obstructive pulmonary disease, unspecified: Secondary | ICD-10-CM | POA: Diagnosis not present

## 2020-02-12 DIAGNOSIS — E1122 Type 2 diabetes mellitus with diabetic chronic kidney disease: Secondary | ICD-10-CM | POA: Diagnosis not present

## 2020-02-12 DIAGNOSIS — R58 Hemorrhage, not elsewhere classified: Secondary | ICD-10-CM | POA: Diagnosis not present

## 2020-02-12 DIAGNOSIS — I2510 Atherosclerotic heart disease of native coronary artery without angina pectoris: Secondary | ICD-10-CM | POA: Insufficient documentation

## 2020-02-12 DIAGNOSIS — R079 Chest pain, unspecified: Secondary | ICD-10-CM | POA: Diagnosis not present

## 2020-02-12 DIAGNOSIS — J811 Chronic pulmonary edema: Secondary | ICD-10-CM | POA: Diagnosis not present

## 2020-02-12 DIAGNOSIS — Z7901 Long term (current) use of anticoagulants: Secondary | ICD-10-CM | POA: Insufficient documentation

## 2020-02-12 DIAGNOSIS — E1142 Type 2 diabetes mellitus with diabetic polyneuropathy: Secondary | ICD-10-CM | POA: Diagnosis not present

## 2020-02-12 DIAGNOSIS — I10 Essential (primary) hypertension: Secondary | ICD-10-CM | POA: Diagnosis not present

## 2020-02-12 DIAGNOSIS — R042 Hemoptysis: Secondary | ICD-10-CM | POA: Diagnosis not present

## 2020-02-12 LAB — CBC
HCT: 36.1 % — ABNORMAL LOW (ref 39.0–52.0)
Hemoglobin: 11.6 g/dL — ABNORMAL LOW (ref 13.0–17.0)
MCH: 29 pg (ref 26.0–34.0)
MCHC: 32.1 g/dL (ref 30.0–36.0)
MCV: 90.3 fL (ref 80.0–100.0)
Platelets: 242 10*3/uL (ref 150–400)
RBC: 4 MIL/uL — ABNORMAL LOW (ref 4.22–5.81)
RDW: 13.8 % (ref 11.5–15.5)
WBC: 10.1 10*3/uL (ref 4.0–10.5)
nRBC: 0 % (ref 0.0–0.2)

## 2020-02-12 LAB — TROPONIN I (HIGH SENSITIVITY): Troponin I (High Sensitivity): 11 ng/L (ref <18)

## 2020-02-12 LAB — BASIC METABOLIC PANEL
Anion gap: 12 (ref 5–15)
BUN: 46 mg/dL — ABNORMAL HIGH (ref 8–23)
CO2: 20 mmol/L — ABNORMAL LOW (ref 22–32)
Calcium: 8.8 mg/dL — ABNORMAL LOW (ref 8.9–10.3)
Chloride: 104 mmol/L (ref 98–111)
Creatinine, Ser: 2.12 mg/dL — ABNORMAL HIGH (ref 0.61–1.24)
GFR calc Af Amer: 36 mL/min — ABNORMAL LOW (ref >60)
GFR calc non Af Amer: 31 mL/min — ABNORMAL LOW (ref >60)
Glucose, Bld: 150 mg/dL — ABNORMAL HIGH (ref 70–99)
Potassium: 4.1 mmol/L (ref 3.5–5.1)
Sodium: 136 mmol/L (ref 135–145)

## 2020-02-12 MED ORDER — CEFDINIR 300 MG PO CAPS
300.0000 mg | ORAL_CAPSULE | Freq: Two times a day (BID) | ORAL | Status: DC
  Filled 2020-02-12: qty 1

## 2020-02-12 MED ORDER — CEFDINIR 300 MG PO CAPS: 300.0000 mg | ORAL_CAPSULE | Freq: Two times a day (BID) | ORAL | 0 refills | Status: AC

## 2020-02-12 MED ORDER — AZITHROMYCIN 250 MG PO TABS: ORAL_TABLET | ORAL | 0 refills | Status: AC

## 2020-02-12 MED ORDER — AZITHROMYCIN 500 MG PO TABS
500.0000 mg | ORAL_TABLET | Freq: Once | ORAL | Status: AC
  Administered 2020-02-12: 500 mg via ORAL
  Filled 2020-02-12: qty 1

## 2020-02-12 NOTE — ED Triage Notes (Signed)
Pt triage via wheelchair.  Pt reports sob and chest pain.  Pt Covid Positive 01/29/20.   Pt states not any better.  Pt has cough.  Former smoker.  Pt alert  Speech clear.

## 2020-02-12 NOTE — ED Notes (Signed)
See triage  note  States he is having some SOB    states he tested positive for COVID on 8/18  Still having some cough and SOB  No fever

## 2020-02-12 NOTE — ED Provider Notes (Signed)
[facility] Emergency Department Provider Note   ____________________________________________    I have reviewed the triage vital signs and the nursing notes.   HISTORY  Chief Complaint Shortness of Breath     HPI Larry Callahan is a 64 y.o. male who presents with complete, productive cough which started over the last several days.  Patient does have a history of diabetes, CHF, discharged from the hospital last month after COVID-19 infection.  He reports he has not been the same since discharge.  Does not believe that he has had any fevers or chills.  Describes discomfort along the bottom of his chest wall bilaterally, slightly worse on the right.  Past Medical History:  Diagnosis Date  . Allergy   . Coronary artery disease   . Diabetes mellitus without complication ([place])   . Diastolic CHF ([place])   . GERD (gastroesophageal reflux disease)   . Hyperlipidemia   . Hypertension   . Renal insufficiency   . Sleep apnea     Patient Active Problem List   Diagnosis Date Noted  . Pneumonia due to COVID-19 virus 01/30/2020  . Elevated troponin 01/29/2020  . Suspected COVID-19 virus infection 01/29/2020  . Postural dizziness with presyncope 01/29/2020  . Hematemesis 01/09/2020  . Hospital discharge follow-up 11/26/2019  . Cellulitis of left hand 09/26/2019  . Generalized weakness 08/08/2019  . BMI 35.0-35.9,adult 08/08/2019  . Acute right hip pain 06/17/2019  . Inability to ambulate due to hip 06/17/2019  . CKD stage 3 due to type 2 diabetes mellitus ([place])   . Hypervolemia   . Acute on chronic diastolic CHF (congestive heart failure) ([place])   . Type 2 diabetes mellitus with stage 3b chronic kidney disease, with long-term current use of insulin ([place])   . Hypomagnesemia   . Full code status 05/20/2019  . Pleuritic chest pain   . Multifocal pneumonia 05/19/2019  . Acute kidney injury superimposed on CKD 3b ([place]) 05/09/2019  . Hemoptysis  04/09/2019  . Shortness of breath 03/26/2019  . Uncontrolled type 2 diabetes mellitus with insulin therapy ([place]) 03/12/2019  . Acute on chronic heart failure with preserved ejection fraction (HFpEF) ([place])   . Chronic kidney disease, stage 3b   . Chronic systolic CHF (congestive heart failure) ([place]) 03/01/2019  . Diastolic dysfunction 49/70/2637  . Iron deficiency anemia 12/31/2018  . Atopic dermatitis 11/24/2018  . Screening for colon cancer 11/06/2017  . Uncontrolled type 2 diabetes mellitus with hyperglycemia ([place]) 11/06/2017  . Dermatitis, unspecified 06/09/2017  . Atherosclerotic heart disease of native coronary artery without angina pectoris 06/09/2017  . 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 Obstructive sleep apnea, adult 06/09/2017  . Morbid (severe) obesity due to excess calories ([place]) 06/09/2017  . Cigarette nicotine dependence with nicotine-induced disorder 06/09/2017  . Diabetic polyneuropathy associated with type 2 diabetes mellitus ([place]) 06/09/2017  . Allergic rhinitis due to pollen 06/09/2017  . Mixed hyperlipidemia 06/09/2017  . COPD (chronic obstructive pulmonary disease) ([place]) 06/09/2017  . Dyspnea on exertion 06/09/2017  . Wheezing 06/09/2017  . Dysuria 06/09/2017  . Snoring 06/09/2017  . Essential hypertension 06/09/2017  . Personal history of other malignant neoplasm of kidney 06/09/2017  . Pain in right hip 06/09/2017  . Impacted cerumen, bilateral 06/09/2017  . Secondary hyperparathyroidism, not elsewhere classified ([place]) 06/09/2017  . Type 2 diabetes mellitus with hyperglycemia ([place]) 06/09/2017  . Tinea corporis 06/09/2017    Past Surgical History:  Procedure Laterality Date  .  BACK SURGERY    . CARDIAC CATHETERIZATION    . CHOLECYSTECTOMY    . COLONOSCOPY WITH PROPOFOL N/A 04/12/2019   Procedure: COLONOSCOPY WITH PROPOFOL;  Surgeon: [name], MD;  Location: [facility] ENDOSCOPY;  Service: Gastroenterology;   Laterality: N/A;  . ESOPHAGOGASTRODUODENOSCOPY N/A 04/12/2019   Procedure: ESOPHAGOGASTRODUODENOSCOPY (EGD);  Surgeon: [name], MD;  Location: [facility] ENDOSCOPY;  Service: Gastroenterology;  Laterality: N/A;  . ESOPHAGOGASTRODUODENOSCOPY (EGD) WITH PROPOFOL N/A 11/21/2019   Procedure: ESOPHAGOGASTRODUODENOSCOPY (EGD) WITH PROPOFOL;  Surgeon: [name], MD;  Location: [facility] ENDOSCOPY;  Service: Gastroenterology;  Laterality: N/A;  . FLEXIBLE BRONCHOSCOPY Bilateral 05/17/2019   Procedure: FLEXIBLE BRONCHOSCOPY;  Surgeon: [name], MD;  Location: [name] ORS;  Service: Pulmonary;  Laterality: Bilateral;  . left arm surgery    . nephrectomy Left   . PARATHYROIDECTOMY    . RIGHT HEART CATH N/A 03/29/2019   Procedure: RIGHT HEART CATH;  Surgeon: [name], MD;  Location: [place] CV LAB;  Service: Cardiovascular;  Laterality: N/A;    Prior to Admission medications   Medication Sig Start Date End Date Taking? Authorizing Provider  acetaminophen (TYLENOL) 325 MG tablet Take 2 tablets (650 mg total) by mouth every 4 (four) hours as needed for headache or mild pain. 03/05/19   Gouru, Illene Silver, MD  albuterol (VENTOLIN HFA) 108 (90 Base) MCG/ACT inhaler Inhale 2 puffs into the lungs every 6 (six) hours as needed for wheezing or shortness of breath. 03/05/19   Gouru, Illene Silver, MD  amLODipine (NORVASC) 10 MG tablet Take 1 tablet (10 mg total) by mouth daily. 08/01/19   [name], NP  ascorbic acid (VITAMIN C) 500 MG tablet Take 1 tablet (500 mg total) by mouth daily. 02/04/20   Oswald Hillock, MD  azithromycin (ZITHROMAX Z-PAK) 250 MG tablet Take 2 tablets (500 mg) on  Day 1,  followed by 1 tablet (250 mg) once daily on Days 2 through 5. 02/12/20 02/17/20  Lavonia Drafts, MD  benzonatate (TESSALON PERLES) 100 MG capsule Take 1 capsule (100 mg total) by mouth 3 (three) times daily as needed for cough. 12/10/19 12/09/20  Carrie Mew, MD  Blood Glucose Calibration (TRUE METRIX LEVEL  1) Low SOLN Use as directed 04/04/18   [name], NP  Blood Glucose Monitoring Suppl (TRUE METRIX AIR GLUCOSE METER) DEVI 1 Device by Does not apply route 2 (two) times daily. 04/13/18   [name], NP  cefdinir (OMNICEF) 300 MG capsule Take 1 capsule (300 mg total) by mouth 2 (two) times daily for 7 days. 02/12/20 02/19/20  Lavonia Drafts, MD  ferrous gluconate (FERGON) 324 MG tablet Take 1 tablet (324 mg total) by mouth daily with breakfast. 08/01/19   [name], NP  furosemide (LASIX) 80 MG tablet Hold until outpatient PCP followup due to acute kidney injury. 11/21/19   Enzo Bi, MD  gabapentin (NEURONTIN) 100 MG capsule Take 1 capsule (100 mg total) by mouth in the morning, at noon, and at bedtime. 11/26/19   [name], NP  gemfibrozil (LOPID) 600 MG tablet Take 1 tablet (600 mg total) by mouth 2 (two) times daily with a meal. 01/17/20   Boscia, Heather E, NP  glimepiride (AMARYL) 2 MG tablet Take 1 tablet (2 mg total) by mouth at bedtime. 08/02/19   Lavera Guise, MD  glucose blood (TRUE METRIX BLOOD GLUCOSE TEST) test strip Use as instructed twice daily diag E11.65 07/30/19   [name], NP  hydrALAZINE (  APRESOLINE) 100 MG tablet Take 1 tablet (100 mg total) by mouth 3 (three) times daily. 01/06/20   [name], NP  hydrOXYzine (ATARAX) 10 MG/5ML syrup Take 5 mLs (10 mg total) by mouth 3 (three) times daily as needed for itching. 09/24/19   Sable Feil, PA-C  insulin glargine, 1 Unit Dial, (TOUJEO) 300 UNIT/ML Solostar Pen Inject 26 Units into the skin daily. 10/24/19   [name], NP  ipratropium-albuterol (DUONEB) 0.5-2.5 (3) MG/3ML SOLN Take 3 mLs by nebulization every 6 (six) hours as needed. 03/08/19   Kendell Bane, NP  isosorbide mononitrate (IMDUR) 30 MG 24 hr tablet Take 1 tablet (30 mg total) by mouth daily. 12/03/19   [name], MD  linagliptin (TRADJENTA) 5 MG TABS tablet Take 1 tablet (5 mg total) by mouth daily. 02/04/20   Oswald Hillock, MD  loratadine (CLARITIN) 10 MG tablet Take 1 tablet (10 mg total) by mouth daily as needed for allergies. 11/21/19   Enzo Bi, MD  metoprolol succinate (TOPROL-XL) 50 MG 24 hr tablet Take 1 tablet (50 mg total) by mouth at bedtime. Take with or immediately following a meal. 07/10/19   Lavera Guise, MD  nitroGLYCERIN (NITROSTAT) 0.4 MG SL tablet Place 1 tablet (0.4 mg total) under the tongue every 5 (five) minutes x 3 doses as needed for chest pain. 05/24/19   Loletha Grayer, MD  NOVOFINE 32G X 6 MM MISC To use with [place] injections daily 12/13/18   [name], NP  omeprazole (PRILOSEC) 40 MG capsule Take 1 capsule (40 mg total) by mouth in the morning and at bedtime. 01/10/20 02/09/20  Wyvonnia Dusky, MD  potassium chloride SA (KLOR-CON) 20 MEQ tablet Take 1 tablet (20 mEq total) by mouth 2 (two) times daily. 11/26/19   [name], NP  rosuvastatin (CRESTOR) 10 MG tablet Take 1 tablet (10 mg total) by mouth daily. 08/23/19 11/21/19  [name], MD  sucralfate (CARAFATE) 1 g tablet Take 1 tablet (1 g total) by mouth 4 (four) times daily -  with meals and at bedtime. 01/10/20 02/09/20  Wyvonnia Dusky, MD  tiotropium (SPIRIVA HANDIHALER) 18 MCG inhalation capsule Place 1 capsule (18 mcg total) into inhaler and inhale daily. 08/07/19 11/19/19  [name], NP  TRUEplus Lancets 33G MISC Use as directed twice daily diag E11.65 07/30/19   [name], NP  vitamin B-12 (CYANOCOBALAMIN) 1000 MCG tablet Take 1 tablet (1,000 mcg total) by mouth daily. 11/21/19   Enzo Bi, MD  zinc sulfate 220 (50 Zn) MG capsule Take 1 capsule (220 mg total) by mouth daily. 02/04/20   Oswald Hillock, MD     Allergies Penicillins  Family History  Problem Relation Age of Onset  . Diabetes Mother   . Lung cancer Father   . Diabetes Sister   . Hypertension Sister   . Heart disease Sister   . Cancer Sister   . Bone cancer Brother     Social History Social History   Tobacco Use  .  Smoking status: Former Smoker    Types: Cigarettes  . Smokeless tobacco: Former Systems developer    Types: Secondary school teacher  . Vaping Use: Never used  Substance Use Topics  . Alcohol use: No  . Drug use: No    Review of Systems  Constitutional: No fever/chills Eyes: No visual changes.  ENT: No sore throat. Cardiovascular: As above Respiratory: Cough productive of sputum Gastrointestinal:  No abdominal pain.    Genitourinary: Negative for dysuria. Musculoskeletal: Negative for back pain. Skin: Negative for rash. Neurological: Negative for headaches or weakness   ____________________________________________   PHYSICAL EXAM:  VITAL SIGNS: ED Triage Vitals  Enc Vitals Group     BP --      Pulse Rate 02/12/20 1537 80     Resp 02/12/20 1537 20     Temp 02/12/20 1537 98.5 F (36.9 C)     Temp Source 02/12/20 1537 Oral     SpO2 02/12/20 1537 95 %     Weight --      Height --      Head Circumference --      Peak Flow --      Pain Score 02/12/20 1538 7     Pain Loc --      Pain Edu? --      Excl. in [place]? --     Constitutional: Alert and oriented.   Nose: No congestion/rhinnorhea. Mouth/Throat: Mucous membranes are moist.   Neck:  Painless ROM Cardiovascular: Normal rate, regular rhythm. Grossly normal heart sounds.  Good peripheral circulation. Respiratory: Normal respiratory effort, no increased work of breathing.  No retractions.  Bibasilar Rales Gastrointestinal: Soft and nontender. No distention.  No CVA tenderness.  Musculoskeletal: No lower extremity tenderness nor edema.  Warm and well perfused Neurologic:  Normal speech and language. No gross focal neurologic deficits are appreciated.  Skin:  Skin is warm, dry and intact. No rash noted. Psychiatric: Mood and affect are normal. Speech and behavior are normal.  ____________________________________________   LABS (all labs ordered are listed, but only abnormal results are displayed)  Labs Reviewed  BASIC METABOLIC  PANEL - Abnormal; Notable for the following components:      Result Value   CO2 20 (*)    Glucose, Bld 150 (*)    BUN 46 (*)    Creatinine, Ser 2.12 (*)    Calcium 8.8 (*)    GFR calc non Af Amer 31 (*)    GFR calc Af Amer 36 (*)    All other components within normal limits  CBC - Abnormal; Notable for the following components:   RBC 4.00 (*)    Hemoglobin 11.6 (*)    HCT 36.1 (*)    All other components within normal limits  TROPONIN I (HIGH SENSITIVITY)   ____________________________________________  EKG  ED ECG REPORT I, Lavonia Drafts, the attending physician, personally viewed and interpreted this ECG.  Date: 02/12/2020  Rhythm: normal sinus rhythm QRS Axis: normal Intervals: normal ST/T Wave abnormalities: Nonspecific changes Narrative Interpretation: no evidence of acute ischemia  ____________________________________________  RADIOLOGY  Chest x-ray reviewed by me with possible infiltrate right lower lung ____________________________________________   PROCEDURES  Procedure(s) performed: No  Procedures   Critical Care performed: No ____________________________________________   INITIAL IMPRESSION / ASSESSMENT AND PLAN / ED COURSE  Pertinent labs & imaging results that were available during my care of the patient were reviewed by me and considered in my medical decision making (see chart for details).  Patient with recent recovery from COVID-19 pneumonia presents with complaints of new productive cough, bilateral chest discomfort, mild shortness of breath.  Differential includes community-acquired pneumonia, COVID-19 scarring, not consistent with ACS  Lab work demonstrates mild elevation of BUN to creatinine ratio consistent with mild dehydration, patient does have a history of CKD.  White blood cell count is normal.  Chest x-ray demonstrates possible infiltrate right lung, given symptoms will cover  with antibiotics for possible CAP/bacterial  pneumonia  Normal white blood cell count, normal SPO2, normal heart rate, normal temperature, normal respiratory rate appropriate for outpatient management, strict return precautions discussed    ____________________________________________   FINAL CLINICAL IMPRESSION(S) / ED DIAGNOSES  Final diagnoses:  Community acquired pneumonia of right lower lobe of lung        Note:  This document was prepared using Dragon voice recognition software and may include unintentional dictation errors.   Lavonia Drafts, MD 02/12/20 2019

## 2020-02-14 ENCOUNTER — Other Ambulatory Visit: Payer: Self-pay

## 2020-02-14 DIAGNOSIS — E876 Hypokalemia: Secondary | ICD-10-CM

## 2020-02-14 MED ORDER — POTASSIUM CHLORIDE CRYS ER 20 MEQ PO TBCR: 20.0000 meq | EXTENDED_RELEASE_TABLET | Freq: Two times a day (BID) | ORAL | 1 refills | Status: DC

## 2020-02-18 ENCOUNTER — Telehealth: Payer: Self-pay | Admitting: Cardiovascular Disease

## 2020-02-18 ENCOUNTER — Other Ambulatory Visit: Payer: Self-pay

## 2020-02-18 ENCOUNTER — Encounter: Payer: Self-pay | Admitting: *Deleted

## 2020-02-18 ENCOUNTER — Emergency Department: Payer: Medicare PPO

## 2020-02-18 DIAGNOSIS — Z87891 Personal history of nicotine dependence: Secondary | ICD-10-CM | POA: Diagnosis not present

## 2020-02-18 DIAGNOSIS — N189 Chronic kidney disease, unspecified: Secondary | ICD-10-CM | POA: Diagnosis not present

## 2020-02-18 DIAGNOSIS — Z79899 Other long term (current) drug therapy: Secondary | ICD-10-CM | POA: Diagnosis not present

## 2020-02-18 DIAGNOSIS — I130 Hypertensive heart and chronic kidney disease with heart failure and stage 1 through stage 4 chronic kidney disease, or unspecified chronic kidney disease: Secondary | ICD-10-CM | POA: Diagnosis not present

## 2020-02-18 DIAGNOSIS — I2510 Atherosclerotic heart disease of native coronary artery without angina pectoris: Secondary | ICD-10-CM | POA: Diagnosis not present

## 2020-02-18 DIAGNOSIS — Z794 Long term (current) use of insulin: Secondary | ICD-10-CM | POA: Insufficient documentation

## 2020-02-18 DIAGNOSIS — R0600 Dyspnea, unspecified: Secondary | ICD-10-CM | POA: Diagnosis not present

## 2020-02-18 DIAGNOSIS — E1122 Type 2 diabetes mellitus with diabetic chronic kidney disease: Secondary | ICD-10-CM | POA: Diagnosis not present

## 2020-02-18 DIAGNOSIS — J441 Chronic obstructive pulmonary disease with (acute) exacerbation: Secondary | ICD-10-CM | POA: Insufficient documentation

## 2020-02-18 DIAGNOSIS — R0602 Shortness of breath: Secondary | ICD-10-CM | POA: Diagnosis not present

## 2020-02-18 DIAGNOSIS — I5022 Chronic systolic (congestive) heart failure: Secondary | ICD-10-CM | POA: Diagnosis not present

## 2020-02-18 LAB — CBC
HCT: 33.9 % — ABNORMAL LOW (ref 39.0–52.0)
Hemoglobin: 11.6 g/dL — ABNORMAL LOW (ref 13.0–17.0)
MCH: 29.5 pg (ref 26.0–34.0)
MCHC: 34.2 g/dL (ref 30.0–36.0)
MCV: 86.3 fL (ref 80.0–100.0)
Platelets: 218 10*3/uL (ref 150–400)
RBC: 3.93 MIL/uL — ABNORMAL LOW (ref 4.22–5.81)
RDW: 13.5 % (ref 11.5–15.5)
WBC: 7.3 10*3/uL (ref 4.0–10.5)
nRBC: 0 % (ref 0.0–0.2)

## 2020-02-18 LAB — BASIC METABOLIC PANEL
Anion gap: 9 (ref 5–15)
BUN: 38 mg/dL — ABNORMAL HIGH (ref 8–23)
CO2: 21 mmol/L — ABNORMAL LOW (ref 22–32)
Calcium: 9.7 mg/dL (ref 8.9–10.3)
Chloride: 107 mmol/L (ref 98–111)
Creatinine, Ser: 1.61 mg/dL — ABNORMAL HIGH (ref 0.61–1.24)
GFR calc Af Amer: 51 mL/min — ABNORMAL LOW (ref >60)
GFR calc non Af Amer: 44 mL/min — ABNORMAL LOW (ref >60)
Glucose, Bld: 121 mg/dL — ABNORMAL HIGH (ref 70–99)
Potassium: 3.7 mmol/L (ref 3.5–5.1)
Sodium: 137 mmol/L (ref 135–145)

## 2020-02-18 LAB — TROPONIN I (HIGH SENSITIVITY)
Troponin I (High Sensitivity): 11 ng/L (ref <18)
Troponin I (High Sensitivity): 11 ng/L (ref <18)

## 2020-02-18 NOTE — ED Triage Notes (Signed)
Pt brought in via ems from home.  Pt has sob.  Sx began this am.   covid positive 3 weeks ago.  No chest pain.   No n/v/d.  Pt alert  Speech clear.

## 2020-02-18 NOTE — Telephone Encounter (Signed)
Spoke to patient's sister, ok per DPR. Says patient is not doing good since being discharged from the hospital with [place]. He continues to spit up blood and feels like he can't breathe. She says he has been staying in the bed and that's not like him. She's tried calling PCP but has not received a return call yet.   While we were on the phone, I was placed on hold and patient was calling on the other line. She came back to me and said the patient could not catch his breath. I advised her to call 911 to come and check him out and possible take him back to the Emergency room.  She said she will do that immediately.

## 2020-02-18 NOTE — Telephone Encounter (Signed)
Patients sister calling in stating that patient has been in the hospital with Covid. Patient is now at home but called her complaining that he is spitting up blood, this is ongoing she says. Patient states he is very anxious about this, not eating or sleeping well. Patients sister usually brings him in for appointments but she is also sick and unable to being him  Please advise

## 2020-02-19 ENCOUNTER — Emergency Department
Admission: EM | Admit: 2020-02-19 | Discharge: 2020-02-19 | Disposition: A | Discharge status: Home / Self Care | Payer: Medicare PPO | Attending: Emergency Medicine | Admitting: Emergency Medicine

## 2020-02-19 DIAGNOSIS — J441 Chronic obstructive pulmonary disease with (acute) exacerbation: Secondary | ICD-10-CM

## 2020-02-19 DIAGNOSIS — R0600 Dyspnea, unspecified: Secondary | ICD-10-CM

## 2020-02-19 MED ORDER — PREDNISONE 20 MG PO TABS: 60.0000 mg | ORAL_TABLET | Freq: Every day | ORAL | 0 refills | Status: AC

## 2020-02-19 MED ORDER — AZITHROMYCIN 250 MG PO TABS: ORAL_TABLET | ORAL | 0 refills | Status: AC

## 2020-02-19 NOTE — ED Provider Notes (Signed)
[facility] Emergency Department Provider Note   ____________________________________________   First MD Initiated Contact with Patient 02/19/20 [phone]     (approximate)  I have reviewed the triage vital signs and the nursing notes.   HISTORY  Chief Complaint Shortness of Breath    HPI Larry Callahan is a 64 y.o. male with a stated past medical history of heart failure and COPD who presents for 2 days of worsening shortness of breath.  Patient states that the shortness of breath is worse on exertion and partially relieved at rest.  Patient states that he saw his cardiologist yesterday for recurrent hemoptysis and states that he is scheduled for endoscopy/colonoscopy within the next 2 weeks.  Patient is also scheduled for follow-up with his cardiologist in the next 1 week.  Patient denies any recent sick contacts.  Patient denies any recent medication changes.  Patient states that he has been compliant with his home inhalers.         Past Medical History:  Diagnosis Date  . Allergy   . Coronary artery disease   . Diabetes mellitus without complication ([place])   . Diastolic CHF ([place])   . GERD (gastroesophageal reflux disease)   . Hyperlipidemia   . Hypertension   . Renal insufficiency   . Sleep apnea     Patient Active Problem List   Diagnosis Date Noted  . Pneumonia due to COVID-19 virus 01/30/2020  . Elevated troponin 01/29/2020  . Suspected COVID-19 virus infection 01/29/2020  . Postural dizziness with presyncope 01/29/2020  . Hematemesis 01/09/2020  . Hospital discharge follow-up 11/26/2019  . Cellulitis of left hand 09/26/2019  . Generalized weakness 08/08/2019  . BMI 35.0-35.9,adult 08/08/2019  . Acute right hip pain 06/17/2019  . Inability to ambulate due to hip 06/17/2019  . CKD stage 3 due to type 2 diabetes mellitus ([place])   . Hypervolemia   . Acute on chronic diastolic CHF (congestive heart failure) ([place])   . Type 2 diabetes  mellitus with stage 3b chronic kidney disease, with long-term current use of insulin ([place])   . Hypomagnesemia   . Full code status 05/20/2019  . Pleuritic chest pain   . Multifocal pneumonia 05/19/2019  . Acute kidney injury superimposed on CKD 3b ([place]) 05/09/2019  . Hemoptysis 04/09/2019  . Shortness of breath 03/26/2019  . Uncontrolled type 2 diabetes mellitus with insulin therapy ([place]) 03/12/2019  . Acute on chronic heart failure with preserved ejection fraction (HFpEF) ([place])   . Chronic kidney disease, stage 3b   . Chronic systolic CHF (congestive heart failure) ([place]) 03/01/2019  . Diastolic dysfunction 46/27/0350  . Iron deficiency anemia 12/31/2018  . Atopic dermatitis 11/24/2018  . Screening for colon cancer 11/06/2017  . Uncontrolled type 2 diabetes mellitus with hyperglycemia ([place]) 11/06/2017  . Dermatitis, unspecified 06/09/2017  . Atherosclerotic heart disease of native coronary artery without angina pectoris 06/09/2017  . 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 Obstructive sleep apnea, adult 06/09/2017  . Morbid (severe) obesity due to excess calories ([place]) 06/09/2017  . Cigarette nicotine dependence with nicotine-induced disorder 06/09/2017  . Diabetic polyneuropathy associated with type 2 diabetes mellitus ([place]) 06/09/2017  . Allergic rhinitis due to pollen 06/09/2017  . Mixed hyperlipidemia 06/09/2017  . COPD (chronic obstructive pulmonary disease) ([place]) 06/09/2017  . Dyspnea on exertion 06/09/2017  . Wheezing 06/09/2017  . Dysuria 06/09/2017  . Snoring 06/09/2017  . Essential hypertension 06/09/2017  . Personal history of other malignant neoplasm  of kidney 06/09/2017  . Pain in right hip 06/09/2017  . Impacted cerumen, bilateral 06/09/2017  . Secondary hyperparathyroidism, not elsewhere classified ([place]) 06/09/2017  . Type 2 diabetes mellitus with hyperglycemia ([place]) 06/09/2017  . Tinea corporis 06/09/2017    Past Surgical  History:  Procedure Laterality Date  . BACK SURGERY    . CARDIAC CATHETERIZATION    . CHOLECYSTECTOMY    . COLONOSCOPY WITH PROPOFOL N/A 04/12/2019   Procedure: COLONOSCOPY WITH PROPOFOL;  Surgeon: [name], MD;  Location: [facility] ENDOSCOPY;  Service: Gastroenterology;  Laterality: N/A;  . ESOPHAGOGASTRODUODENOSCOPY N/A 04/12/2019   Procedure: ESOPHAGOGASTRODUODENOSCOPY (EGD);  Surgeon: [name], MD;  Location: [facility] ENDOSCOPY;  Service: Gastroenterology;  Laterality: N/A;  . ESOPHAGOGASTRODUODENOSCOPY (EGD) WITH PROPOFOL N/A 11/21/2019   Procedure: ESOPHAGOGASTRODUODENOSCOPY (EGD) WITH PROPOFOL;  Surgeon: [name], MD;  Location: [facility] ENDOSCOPY;  Service: Gastroenterology;  Laterality: N/A;  . FLEXIBLE BRONCHOSCOPY Bilateral 05/17/2019   Procedure: FLEXIBLE BRONCHOSCOPY;  Surgeon: [name], MD;  Location: [name] ORS;  Service: Pulmonary;  Laterality: Bilateral;  . left arm surgery    . nephrectomy Left   . PARATHYROIDECTOMY    . RIGHT HEART CATH N/A 03/29/2019   Procedure: RIGHT HEART CATH;  Surgeon: [name], MD;  Location: [place] CV LAB;  Service: Cardiovascular;  Laterality: N/A;    Prior to Admission medications   Medication Sig Start Date End Date Taking? Authorizing Provider  acetaminophen (TYLENOL) 325 MG tablet Take 2 tablets (650 mg total) by mouth every 4 (four) hours as needed for headache or mild pain. 03/05/19   Gouru, Illene Silver, MD  albuterol (VENTOLIN HFA) 108 (90 Base) MCG/ACT inhaler Inhale 2 puffs into the lungs every 6 (six) hours as needed for wheezing or shortness of breath. 03/05/19   Gouru, Illene Silver, MD  amLODipine (NORVASC) 10 MG tablet Take 1 tablet (10 mg total) by mouth daily. 08/01/19   [name], NP  ascorbic acid (VITAMIN C) 500 MG tablet Take 1 tablet (500 mg total) by mouth daily. 02/04/20   Oswald Hillock, MD  azithromycin (ZITHROMAX Z-PAK) 250 MG tablet Take 2 tablets (500 mg) on  Day 1,  followed by 1 tablet (250  mg) once daily on Days 2 through 5. 02/19/20 02/24/20  Aryel Edelen, Vista Lawman, MD  benzonatate (TESSALON PERLES) 100 MG capsule Take 1 capsule (100 mg total) by mouth 3 (three) times daily as needed for cough. 12/10/19 12/09/20  Carrie Mew, MD  Blood Glucose Calibration (TRUE METRIX LEVEL 1) Low SOLN Use as directed 04/04/18   [name], NP  Blood Glucose Monitoring Suppl (TRUE METRIX AIR GLUCOSE METER) DEVI 1 Device by Does not apply route 2 (two) times daily. 04/13/18   [name], NP  cefdinir (OMNICEF) 300 MG capsule Take 1 capsule (300 mg total) by mouth 2 (two) times daily for 7 days. 02/12/20 02/19/20  Lavonia Drafts, MD  ferrous gluconate (FERGON) 324 MG tablet Take 1 tablet (324 mg total) by mouth daily with breakfast. 08/01/19   [name], NP  furosemide (LASIX) 80 MG tablet Hold until outpatient PCP followup due to acute kidney injury. 11/21/19   Enzo Bi, MD  gabapentin (NEURONTIN) 100 MG capsule Take 1 capsule (100 mg total) by mouth in the morning, at noon, and at bedtime. 11/26/19   [name], NP  gemfibrozil (LOPID) 600 MG tablet Take 1 tablet (600 mg total) by mouth 2 (two) times daily with a meal. 01/17/20  [name], NP  glimepiride (AMARYL) 2 MG tablet Take 1 tablet (2 mg total) by mouth at bedtime. 08/02/19   Lavera Guise, MD  glucose blood (TRUE METRIX BLOOD GLUCOSE TEST) test strip Use as instructed twice daily diag E11.65 07/30/19   [name], NP  hydrALAZINE (APRESOLINE) 100 MG tablet Take 1 tablet (100 mg total) by mouth 3 (three) times daily. 01/06/20   [name], NP  hydrOXYzine (ATARAX) 10 MG/5ML syrup Take 5 mLs (10 mg total) by mouth 3 (three) times daily as needed for itching. 09/24/19   Sable Feil, PA-C  insulin glargine, 1 Unit Dial, (TOUJEO) 300 UNIT/ML Solostar Pen Inject 26 Units into the skin daily. 10/24/19   [name], NP  ipratropium-albuterol (DUONEB) 0.5-2.5 (3) MG/3ML SOLN Take 3 mLs by nebulization every 6  (six) hours as needed. 03/08/19   Kendell Bane, NP  isosorbide mononitrate (IMDUR) 30 MG 24 hr tablet Take 1 tablet (30 mg total) by mouth daily. 12/03/19   [name], MD  linagliptin (TRADJENTA) 5 MG TABS tablet Take 1 tablet (5 mg total) by mouth daily. 02/04/20   Oswald Hillock, MD  loratadine (CLARITIN) 10 MG tablet Take 1 tablet (10 mg total) by mouth daily as needed for allergies. 11/21/19   Enzo Bi, MD  metoprolol succinate (TOPROL-XL) 50 MG 24 hr tablet Take 1 tablet (50 mg total) by mouth at bedtime. Take with or immediately following a meal. 07/10/19   Lavera Guise, MD  nitroGLYCERIN (NITROSTAT) 0.4 MG SL tablet Place 1 tablet (0.4 mg total) under the tongue every 5 (five) minutes x 3 doses as needed for chest pain. 05/24/19   Loletha Grayer, MD  NOVOFINE 32G X 6 MM MISC To use with [place] injections daily 12/13/18   [name], NP  omeprazole (PRILOSEC) 40 MG capsule Take 1 capsule (40 mg total) by mouth in the morning and at bedtime. 01/10/20 02/09/20  Wyvonnia Dusky, MD  potassium chloride SA (KLOR-CON) 20 MEQ tablet Take 1 tablet (20 mEq total) by mouth 2 (two) times daily. 02/14/20   [name], NP  predniSONE (DELTASONE) 20 MG tablet Take 3 tablets (60 mg total) by mouth daily for 5 days. 02/19/20 02/24/20  Naaman Plummer, MD  rosuvastatin (CRESTOR) 10 MG tablet Take 1 tablet (10 mg total) by mouth daily. 08/23/19 11/21/19  [name], MD  sucralfate (CARAFATE) 1 g tablet Take 1 tablet (1 g total) by mouth 4 (four) times daily -  with meals and at bedtime. 01/10/20 02/09/20  Wyvonnia Dusky, MD  tiotropium (SPIRIVA HANDIHALER) 18 MCG inhalation capsule Place 1 capsule (18 mcg total) into inhaler and inhale daily. 08/07/19 11/19/19  [name], NP  TRUEplus Lancets 33G MISC Use as directed twice daily diag E11.65 07/30/19   [name], NP  vitamin B-12 (CYANOCOBALAMIN) 1000 MCG tablet Take 1 tablet (1,000 mcg total) by mouth daily. 11/21/19   Enzo Bi,  MD  zinc sulfate 220 (50 Zn) MG capsule Take 1 capsule (220 mg total) by mouth daily. 02/04/20   Oswald Hillock, MD    Allergies Penicillins  Family History  Problem Relation Age of Onset  . Diabetes Mother   . Lung cancer Father   . Diabetes Sister   . Hypertension Sister   . Heart disease Sister   . Cancer Sister   . Bone cancer Brother     Social History Social History  Tobacco Use  . Smoking status: Former Smoker    Types: Cigarettes  . Smokeless tobacco: Former Systems developer    Types: Secondary school teacher  . Vaping Use: Never used  Substance Use Topics  . Alcohol use: No  . Drug use: No    Review of Systems Constitutional: No fever/chills Eyes: No visual changes. ENT: No sore throat. Cardiovascular: Denies chest pain. Respiratory: Denies productive cough. Gastrointestinal: No abdominal pain.  No nausea, no vomiting.  No diarrhea. Genitourinary: Negative for dysuria. Musculoskeletal: Negative for acute arthralgias Skin: Negative for rash. Neurological: Negative for headaches, weakness/numbness/paresthesias in any extremity Psychiatric: Negative for suicidal ideation/homicidal ideation   ____________________________________________   PHYSICAL EXAM:  VITAL SIGNS: ED Triage Vitals  Enc Vitals Group     BP 02/18/20 1724 139/80     Pulse Rate 02/18/20 1724 74     Resp 02/18/20 1724 20     Temp 02/18/20 1724 98.2 F (36.8 C)     Temp Source 02/18/20 1724 Oral     SpO2 02/18/20 1724 98 %     Weight 02/18/20 1725 300 lb (136.1 kg)     Height 02/18/20 1725 _0  (1.803 m)     Head Circumference --      Peak Flow --      Pain Score --      Pain Loc --      Pain Edu? --      Excl. in [place]? --    Constitutional: Alert and oriented. Well appearing and in no acute distress. Eyes: Conjunctivae are normal. PERRL. EOMI. Head: Atraumatic. Nose: No congestion/rhinnorhea. Mouth/Throat: Mucous membranes are moist.  Oropharynx non-erythematous. Neck: No stridor.     Cardiovascular: Normal rate, regular rhythm. Grossly normal heart sounds.  Good peripheral circulation. Respiratory: Normal respiratory effort.  No retractions.  Mild inspiratory/moderate expiratory wheezes bilaterally. Gastrointestinal: Soft and nontender. No distention. No abdominal bruits. No CVA tenderness. Musculoskeletal: No lower extremity tenderness nor edema.  No joint effusions. Neurologic:  Normal speech and language. No gross focal neurologic deficits are appreciated. No gait instability. Skin:  Skin is warm, dry and intact. No rash noted. Psychiatric: Mood and affect are normal. Speech and behavior are normal.  ____________________________________________   LABS (all labs ordered are listed, but only abnormal results are displayed)  Labs Reviewed  BASIC METABOLIC PANEL - Abnormal; Notable for the following components:      Result Value   CO2 21 (*)    Glucose, Bld 121 (*)    BUN 38 (*)    Creatinine, Ser 1.61 (*)    GFR calc non Af Amer 44 (*)    GFR calc Af Amer 51 (*)    All other components within normal limits  CBC - Abnormal; Notable for the following components:   RBC 3.93 (*)    Hemoglobin 11.6 (*)    HCT 33.9 (*)    All other components within normal limits  TROPONIN I (HIGH SENSITIVITY)  TROPONIN I (HIGH SENSITIVITY)   ____________________________________________  EKG  ED ECG REPORT I, Naaman Plummer, the attending physician, personally viewed and interpreted this ECG.  Date: 02/19/2020 EKG Time: 1728 Rate: 75 Rhythm: normal sinus rhythm QRS Axis: normal Intervals: normal ST/T Wave abnormalities: normal Narrative Interpretation: no evidence of acute ischemia  ____________________________________________  RADIOLOGY  ED MD interpretation: 2 view x-ray of the chest shows nearly resolved bilateral infiltrates since 02/12/2020 with no evidence new consolidation or effusion  Official radiology report(s): DG Chest 2 View  Result Date:  02/18/2020 CLINICAL DATA:  Shortness of breath EXAM: CHEST - 2 VIEW COMPARISON:  02/12/2020, CT 01/24/2020 FINDINGS: Improved aeration since 02/12/2020 with nearly resolved bilateral infiltrates. No new consolidation or effusion. Stable cardiomediastinal silhouette. No pneumothorax. IMPRESSION: Improved aeration since 02/12/2020 with nearly resolved bilateral infiltrates. Electronically Signed   By: Donavan Foil M.D.   On: 02/18/2020 21:03    ____________________________________________   PROCEDURES  Procedure(s) performed (including Critical Care):  Procedures   ____________________________________________   INITIAL IMPRESSION / ASSESSMENT AND PLAN / ED COURSE  _0 @     The patient appears to be suffering from a moderate exacerbation of COPD.  Based on the history, exam, CXR/EKG, and further workup I dont suspect any other emergent cause of this presentation, such as pneumonia, acute coronary syndrome, congestive heart failure, pulmonary embolism, or pneumothorax.  ED Interventions: bronchodilators, steroids, antibiotics, reassess  Reassessment: Patient is resting comfortably doubt any respiratory distress or requirement of supplemental oxygenation. They are comfortable and want to go home.  Rx: Steroids, Antibiotics Disposition: Discharge home with SRP. PCP follow up recommended in next 48hours.      ____________________________________________   FINAL CLINICAL IMPRESSION(S) / ED DIAGNOSES  Final diagnoses:  COPD exacerbation ([place])  Dyspnea, unspecified type     ED Discharge Orders         Ordered    predniSONE (DELTASONE) 20 MG tablet  Daily        02/19/20 0925    azithromycin (ZITHROMAX Z-PAK) 250 MG tablet        02/19/20 [id number]           Note:  This document was prepared using Dragon voice recognition software and may include unintentional dictation errors.   Naaman Plummer, MD 02/19/20 [phone]

## 2020-02-19 NOTE — ED Notes (Signed)
Pt given cup of coffee per his request. Pt states his friend will be able to come pick him up at approx 1030 due to her just being woke up.

## 2020-02-20 ENCOUNTER — Other Ambulatory Visit: Payer: Self-pay

## 2020-02-20 DIAGNOSIS — IMO0002 Reserved for concepts with insufficient information to code with codable children: Secondary | ICD-10-CM

## 2020-02-20 DIAGNOSIS — E1142 Type 2 diabetes mellitus with diabetic polyneuropathy: Secondary | ICD-10-CM

## 2020-02-20 MED ORDER — ALBUTEROL SULFATE HFA 108 (90 BASE) MCG/ACT IN AERS: 2.0000 | INHALATION_SPRAY | Freq: Four times a day (QID) | RESPIRATORY_TRACT | 0 refills | Status: DC | PRN

## 2020-02-20 MED ORDER — GABAPENTIN 100 MG PO CAPS: 100.0000 mg | ORAL_CAPSULE | Freq: Three times a day (TID) | ORAL | 1 refills | Status: DC

## 2020-02-20 MED ORDER — GLIMEPIRIDE 2 MG PO TABS: 2.0000 mg | ORAL_TABLET | Freq: Every day | ORAL | 3 refills | Status: DC

## 2020-02-25 ENCOUNTER — Ambulatory Visit: Payer: Medicare PPO | Admitting: Cardiovascular Disease

## 2020-02-25 ENCOUNTER — Telehealth (HOSPITAL_COMMUNITY): Payer: Self-pay

## 2020-02-25 NOTE — Progress Notes (Deleted)
Cardiology Office Note  Date:  02/25/2020   ID:  Larry Callahan 1956-02-15, MRN [id number]  PCP:  [name], NP   No chief complaint on file.   HPI:  Larry Callahan is a 64 y.o. male with a hx of  morbid obesity,  hypertension poorly controlled,  type 2 diabetes  smoker,  chronic renal insufficiency,  single kidney,  obstructive sleep apnea on CPAP,    presenting to the emergency room September 2020 with shortness of breath on exertion Presents for routine follow-up of his new shortness of breath, diastolic dysfunction  Frequent trips to the emergency room for various issues  Seen in the ER [facility] 08/12/2018: leg pain peripheral neuropathy. Started on gabapentin  Recently in the hospital with discharge August 09, 2019 Was admitted for left arm pain, possible muscle spasm  Prior to that was in the emergency room 4 days earlier for bilateral leg pain, peripheral neuropathy.  Was in hospital January 2021 with acute right hip pain  In the hospital December 2020 with COPD exacerbation, pneumonia Several days prior was in the hospital for recurrent hemoptysis, work-up unrevealing  Thinks breathing improving Weight trending down Sometimes with a cane Leg pain better on gabapentin  Lab work reviewed CR >2 HBA1C 6.8  Quit smoking summer 2020  EKG personally reviewed by myself on todays visit Shows normal sinus rhythm with rate 76 bpm no significant ST or T wave changes   Details of recent hospitalization reviewed with him several recent trips to the emergency room for similar symptoms of shortness of breath on exertion  Recent work-up February 20, 2019 for shortness of breath   seen December 29, 2018 for similar symptoms of shortness of breath on exertion, at that time hemoglobin 9.6, creatinine 1.63  Blood pressure typically running high Presented to the hospital with midsternal nonradiating chest pain And hypertension   echocardiogram  July 2020 Report not in the computer Per primary care office notes had normal LV function  left heart catheterization 2011 Nonobstructive disease at that time Nondominant RCA  Diagnosed with hypertensive heart disease with diastolic CHF, underlying COPD given long history of smoking  morbid obesity, BNP 322 Treated with IV Lasix  CT scan chest with no PE   significant coronary calcification through the LAD Unable to exclude underlying ischemia, unable several risk factors including poorly controlled diabetes --Stress test performed showing no ischemia   PMH:   has a past medical history of Allergy, Coronary artery disease, Diabetes mellitus without complication ([place]), Diastolic CHF ([place]), GERD (gastroesophageal reflux disease), Hyperlipidemia, Hypertension, Renal insufficiency, and Sleep apnea.  PSH:    Past Surgical History:  Procedure Laterality Date   BACK SURGERY     CARDIAC CATHETERIZATION     CHOLECYSTECTOMY     COLONOSCOPY WITH PROPOFOL N/A 04/12/2019   Procedure: COLONOSCOPY WITH PROPOFOL;  Surgeon: [name], MD;  Location: [facility] ENDOSCOPY;  Service: Gastroenterology;  Laterality: N/A;   ESOPHAGOGASTRODUODENOSCOPY N/A 04/12/2019   Procedure: ESOPHAGOGASTRODUODENOSCOPY (EGD);  Surgeon: [name], MD;  Location: [facility] ENDOSCOPY;  Service: Gastroenterology;  Laterality: N/A;   ESOPHAGOGASTRODUODENOSCOPY (EGD) WITH PROPOFOL N/A 11/21/2019   Procedure: ESOPHAGOGASTRODUODENOSCOPY (EGD) WITH PROPOFOL;  Surgeon: [name], MD;  Location: [facility] ENDOSCOPY;  Service: Gastroenterology;  Laterality: N/A;   FLEXIBLE BRONCHOSCOPY Bilateral 05/17/2019   Procedure: FLEXIBLE BRONCHOSCOPY;  Surgeon: [name], MD;  Location: [name] ORS;  Service: Pulmonary;  Laterality: Bilateral;   left arm surgery     nephrectomy  Left    PARATHYROIDECTOMY     RIGHT HEART CATH N/A 03/29/2019   Procedure: RIGHT HEART CATH;  Surgeon: [name], MD;   Location: [place] CV LAB;  Service: Cardiovascular;  Laterality: N/A;    Current Outpatient Medications  Medication Sig Dispense Refill   acetaminophen (TYLENOL) 325 MG tablet Take 2 tablets (650 mg total) by mouth every 4 (four) hours as needed for headache or mild pain.     albuterol (VENTOLIN HFA) 108 (90 Base) MCG/ACT inhaler Inhale 2 puffs into the lungs every 6 (six) hours as needed for wheezing or shortness of breath. 18 g 0   amLODipine (NORVASC) 10 MG tablet Take 1 tablet (10 mg total) by mouth daily. 90 tablet 2   ascorbic acid (VITAMIN C) 500 MG tablet Take 1 tablet (500 mg total) by mouth daily. 10 tablet 0   benzonatate (TESSALON PERLES) 100 MG capsule Take 1 capsule (100 mg total) by mouth 3 (three) times daily as needed for cough. 30 capsule 0   Blood Glucose Calibration (TRUE METRIX LEVEL 1) Low SOLN Use as directed 1 each 1   Blood Glucose Monitoring Suppl (TRUE METRIX AIR GLUCOSE METER) DEVI 1 Device by Does not apply route 2 (two) times daily. 1 Device 0   ferrous gluconate (FERGON) 324 MG tablet Take 1 tablet (324 mg total) by mouth daily with breakfast. 90 tablet 3   furosemide (LASIX) 80 MG tablet Hold until outpatient PCP followup due to acute kidney injury. 60 tablet 3   gabapentin (NEURONTIN) 100 MG capsule Take 1 capsule (100 mg total) by mouth in the morning, at noon, and at bedtime. 90 capsule 1   gemfibrozil (LOPID) 600 MG tablet Take 1 tablet (600 mg total) by mouth 2 (two) times daily with a meal. 180 tablet 1   glimepiride (AMARYL) 2 MG tablet Take 1 tablet (2 mg total) by mouth at bedtime. 30 tablet 3   glucose blood (TRUE METRIX BLOOD GLUCOSE TEST) test strip Use as instructed twice daily diag E11.65 100 each 3   hydrALAZINE (APRESOLINE) 100 MG tablet Take 1 tablet (100 mg total) by mouth 3 (three) times daily. 180 tablet 1   hydrOXYzine (ATARAX) 10 MG/5ML syrup Take 5 mLs (10 mg total) by mouth 3 (three) times daily as needed for itching. 240  mL 0   insulin glargine, 1 Unit Dial, (TOUJEO) 300 UNIT/ML Solostar Pen Inject 26 Units into the skin daily. 3 pen 3   ipratropium-albuterol (DUONEB) 0.5-2.5 (3) MG/3ML SOLN Take 3 mLs by nebulization every 6 (six) hours as needed. 1080 mL 2   isosorbide mononitrate (IMDUR) 30 MG 24 hr tablet Take 1 tablet (30 mg total) by mouth daily. 30 tablet 1   linagliptin (TRADJENTA) 5 MG TABS tablet Take 1 tablet (5 mg total) by mouth daily. 30 tablet 0   loratadine (CLARITIN) 10 MG tablet Take 1 tablet (10 mg total) by mouth daily as needed for allergies.     metoprolol succinate (TOPROL-XL) 50 MG 24 hr tablet Take 1 tablet (50 mg total) by mouth at bedtime. Take with or immediately following a meal. 90 tablet 1   nitroGLYCERIN (NITROSTAT) 0.4 MG SL tablet Place 1 tablet (0.4 mg total) under the tongue every 5 (five) minutes x 3 doses as needed for chest pain. 30 tablet 0   NOVOFINE 32G X 6 MM MISC To use with [place] injections daily 100 each 4   omeprazole (PRILOSEC) 40 MG capsule Take 1 capsule (40  mg total) by mouth in the morning and at bedtime. 60 capsule 0   potassium chloride SA (KLOR-CON) 20 MEQ tablet Take 1 tablet (20 mEq total) by mouth 2 (two) times daily. 60 tablet 1   rosuvastatin (CRESTOR) 10 MG tablet Take 1 tablet (10 mg total) by mouth daily. 90 tablet 3   sucralfate (CARAFATE) 1 g tablet Take 1 tablet (1 g total) by mouth 4 (four) times daily -  with meals and at bedtime. 120 tablet 0   tiotropium (SPIRIVA HANDIHALER) 18 MCG inhalation capsule Place 1 capsule (18 mcg total) into inhaler and inhale daily. 90 capsule 0   TRUEplus Lancets 33G MISC Use as directed twice daily diag E11.65 100 each 3   vitamin B-12 (CYANOCOBALAMIN) 1000 MCG tablet Take 1 tablet (1,000 mcg total) by mouth daily.     zinc sulfate 220 (50 Zn) MG capsule Take 1 capsule (220 mg total) by mouth daily. 10 capsule 0   No current facility-administered medications for this visit.     Allergies:    Penicillins   Social History:  The patient  reports that he has quit smoking. His smoking use included cigarettes. He has quit using smokeless tobacco.  His smokeless tobacco use included chew. He reports that he does not drink alcohol and does not use drugs.   Family History:   family history includes Bone cancer in his brother; Cancer in his sister; Diabetes in his mother and sister; Heart disease in his sister; Hypertension in his sister; Lung cancer in his father.    Review of Systems: Review of Systems  Constitutional: Negative.   HENT: Negative.   Respiratory: Negative.   Cardiovascular: Negative.   Gastrointestinal: Negative.   Musculoskeletal: Negative.   Neurological: Negative.   Psychiatric/Behavioral: Negative.   All other systems reviewed and are negative.    PHYSICAL EXAM: VS:  There were no vitals taken for this visit. , BMI There is no height or weight on file to calculate BMI. Constitutional:  oriented to person, place, and time. No distress.  HENT:  Head: Grossly normal Eyes:  no discharge. No scleral icterus.  Neck: No JVD, no carotid bruits  Cardiovascular: Regular rate and rhythm, no murmurs appreciated Pulmonary/Chest: Clear to auscultation bilaterally, no wheezes or rails Abdominal: Soft.  no distension.  no tenderness.  Musculoskeletal: Normal range of motion Neurological:  normal muscle tone. Coordination normal. No atrophy Skin: Skin warm and dry Psychiatric: normal affect, pleasant   Recent Labs: 11/21/2019: Magnesium 2.1 01/24/2020: B Natriuretic Peptide 94.5 01/29/2020: TSH 1.738 01/31/2020: ALT 23 02/18/2020: BUN 38; Creatinine, Ser 1.61; Hemoglobin 11.6; Platelets 218; Potassium 3.7; Sodium 137    Lipid Panel Lab Results  Component Value Date   CHOL 166 03/02/2019   HDL 35 (L) 03/02/2019   LDLCALC 98 03/02/2019   TRIG 166 (H) 03/02/2019      Wt Readings from Last 3 Encounters:  02/18/20 300 lb (136.1 kg)  02/07/20 (!) 302 lb 0.5 oz (137  kg)  01/30/20 (!) 302 lb 0.5 oz (137 kg)       ASSESSMENT AND PLAN:  Problem List Items Addressed This Visit    None     Diastolic CHF, hypertensive heart disease Recommend he continue his current medication regiment Potassium stable Lab work from [facility] reviewed He does have underlying COPD, very deconditioned, recommended weight loss  Essential hypertension Blood pressure stable at home, elevated on today's visit Recommended continued weight loss  CAD Stressed importance of taking his cholesterol  medication, diabetes control Currently with no symptoms of angina. No further workup at this time. Continue current medication regimen.  Type 2 diabetes Managed by primary care  Disposition:   F/U  12 months  Extensive records reviewed, numerous trips to the emergency room from a recent trip to [facility] Musculoskeletal symptoms, neuropathy in his legs  Total encounter time more than 45 minutes  Greater than 50% was spent in counseling and coordination of care with the patient    Signed, Esmond Plants, M.D., Ph.D. [place], [place]

## 2020-02-25 NOTE — Telephone Encounter (Signed)
Attempted to contact to set up home visit.  No answer, will continue to contact.   [place] [phone]

## 2020-02-26 ENCOUNTER — Encounter: Payer: Self-pay | Admitting: Cardiovascular Disease

## 2020-02-26 ENCOUNTER — Telehealth: Payer: Self-pay

## 2020-02-26 NOTE — Telephone Encounter (Signed)
Called pt, high utilize with ER visits, he has missed the last few appointments and I know he has been up and down with sickness with primary,pulmonary,cardiology. I left a message for him and he needs to try and at least do a video/telephone visit with [name] so we can follow up with him and get him any resources needed. Larry Callahan

## 2020-03-11 ENCOUNTER — Other Ambulatory Visit: Payer: Self-pay | Admitting: Cardiovascular Disease

## 2020-03-11 MED ORDER — ISOSORBIDE MONONITRATE ER 30 MG PO TB24
30.0000 mg | ORAL_TABLET | Freq: Every day | ORAL | 1 refills | Status: DC
Start: 2020-03-11 — End: 2020-07-17

## 2020-03-11 MED ORDER — ROSUVASTATIN CALCIUM 10 MG PO TABS: 10.0000 mg | ORAL_TABLET | Freq: Every day | ORAL | 1 refills | Status: DC

## 2020-03-11 NOTE — Telephone Encounter (Signed)
°*  STAT* If patient is at the pharmacy, call can be transferred to refill team.   1. Which medications need to be refilled? (please list name of each medication and dose if known)   Per Sister Patient is not sure he is out of all of them   2. Which pharmacy/location (including street and city if local pharmacy) is medication to be sent to?  [address]   3. Do they need a 30 day or 90 day supply? 90   Patient is out of meds

## 2020-03-11 NOTE — Telephone Encounter (Signed)
Requested Prescriptions   Signed Prescriptions Disp Refills   rosuvastatin (CRESTOR) 10 MG tablet 90 tablet 1    Sig: Take 1 tablet (10 mg total) by mouth daily.    Authorizing Provider: [name]    Ordering User: Raelene Bott, Gwen Edler L   isosorbide mononitrate (IMDUR) 30 MG 24 hr tablet 90 tablet 1    Sig: Take 1 tablet (30 mg total) by mouth daily.    Authorizing Provider: [name]    Ordering User: Raelene Bott, Blakley Michna L

## 2020-03-16 ENCOUNTER — Other Ambulatory Visit: Payer: Self-pay

## 2020-03-16 ENCOUNTER — Telehealth: Payer: Self-pay

## 2020-03-16 MED ORDER — INSULIN GLARGINE (1 UNIT DIAL) 300 UNIT/ML ~~LOC~~ SOPN: 26.0000 [IU] | PEN_INJECTOR | Freq: Every day | SUBCUTANEOUS | 0 refills | Status: DC

## 2020-03-16 NOTE — Telephone Encounter (Signed)
Called patient and left message to call us back to schedule an appt to fill further refills. Missed last two appointments.

## 2020-03-18 ENCOUNTER — Ambulatory Visit (INDEPENDENT_AMBULATORY_CARE_PROVIDER_SITE_OTHER): Payer: Medicare PPO | Admitting: Internal Medicine

## 2020-03-18 ENCOUNTER — Other Ambulatory Visit: Payer: Self-pay

## 2020-03-18 ENCOUNTER — Encounter: Payer: Self-pay | Admitting: Internal Medicine

## 2020-03-18 DIAGNOSIS — Z0001 Encounter for general adult medical examination with abnormal findings: Secondary | ICD-10-CM | POA: Diagnosis not present

## 2020-03-18 DIAGNOSIS — F411 Generalized anxiety disorder: Secondary | ICD-10-CM | POA: Diagnosis not present

## 2020-03-18 DIAGNOSIS — R079 Chest pain, unspecified: Secondary | ICD-10-CM | POA: Diagnosis not present

## 2020-03-18 DIAGNOSIS — N1832 Chronic kidney disease, stage 3b: Secondary | ICD-10-CM | POA: Diagnosis not present

## 2020-03-18 DIAGNOSIS — Z794 Long term (current) use of insulin: Secondary | ICD-10-CM

## 2020-03-18 DIAGNOSIS — I700 Atherosclerosis of aorta: Secondary | ICD-10-CM

## 2020-03-18 DIAGNOSIS — L732 Hidradenitis suppurativa: Secondary | ICD-10-CM

## 2020-03-18 DIAGNOSIS — N6311 Unspecified lump in the right breast, upper outer quadrant: Secondary | ICD-10-CM

## 2020-03-18 DIAGNOSIS — E1122 Type 2 diabetes mellitus with diabetic chronic kidney disease: Secondary | ICD-10-CM

## 2020-03-18 DIAGNOSIS — J449 Chronic obstructive pulmonary disease, unspecified: Secondary | ICD-10-CM

## 2020-03-18 DIAGNOSIS — E782 Mixed hyperlipidemia: Secondary | ICD-10-CM

## 2020-03-18 LAB — POCT GLYCOSYLATED HEMOGLOBIN (HGB A1C): Hemoglobin A1C: 6.4 % — AB (ref 4.0–5.6)

## 2020-03-18 MED ORDER — ESCITALOPRAM OXALATE 10 MG PO TABS: ORAL_TABLET | ORAL | 3 refills | Status: DC

## 2020-03-18 MED ORDER — TRAMADOL HCL 50 MG PO TABS: 50.0000 mg | ORAL_TABLET | Freq: Three times a day (TID) | ORAL | 0 refills | Status: AC | PRN

## 2020-03-18 MED ORDER — DOXYCYCLINE HYCLATE 100 MG PO TABS: 100.0000 mg | ORAL_TABLET | Freq: Two times a day (BID) | ORAL | 0 refills | Status: DC

## 2020-03-18 MED ORDER — METOPROLOL SUCCINATE ER 50 MG PO TB24: 50.0000 mg | ORAL_TABLET | Freq: Every day | ORAL | 1 refills | Status: DC

## 2020-03-18 NOTE — Progress Notes (Signed)
[facility] [place], [place] [address]  Internal MEDICINE  Office Visit Note  Patient Name: Larry Callahan  [id number]  [id number]  Date of Service: 03/21/2020  Chief Complaint  Patient presents with  . Medicare Wellness  . Hospitalization Follow-up    02/19/20   . Flank Pain    right side at breast and goes down to mid side  . Cyst    under right arm it swells up and bust goes away and comes back.  seen Dr in [employer]  . Quality Metric Gaps    Hep C screen, Eye exam, pneumonis vacc, foot exam     HPI Pt is here for routine health maintenance examination. Pt is concerned about chest pain starting from right axilla to right side of the breast. There is multiple small abscesses on the right side of chest. No vesicles are seen., BP is elevated, pt is very anxious. Pt seems to be very confused about his medications. COPD seems to be under control.  Current Medication: Outpatient Encounter Medications as of 03/18/2020  Medication Sig Note  . acetaminophen (TYLENOL) 325 MG tablet Take 2 tablets (650 mg total) by mouth every 4 (four) hours as needed for headache or mild pain.   Marland Kitchen albuterol (VENTOLIN HFA) 108 (90 Base) MCG/ACT inhaler Inhale 2 puffs into the lungs every 6 (six) hours as needed for wheezing or shortness of breath.   Marland Kitchen amLODipine (NORVASC) 10 MG tablet Take 1 tablet (10 mg total) by mouth daily.   Marland Kitchen ascorbic acid (VITAMIN C) 500 MG tablet Take 1 tablet (500 mg total) by mouth daily.   . benzonatate (TESSALON PERLES) 100 MG capsule Take 1 capsule (100 mg total) by mouth 3 (three) times daily as needed for cough.   . Blood Glucose Calibration (TRUE METRIX LEVEL 1) Low SOLN Use as directed   . Blood Glucose Monitoring Suppl (TRUE METRIX AIR GLUCOSE METER) DEVI 1 Device by Does not apply route 2 (two) times daily.   . ferrous gluconate (FERGON) 324 MG tablet Take 1 tablet (324 mg total) by mouth daily with breakfast.   . furosemide (LASIX)  80 MG tablet Hold until outpatient PCP followup due to acute kidney injury.   Marland Kitchen gabapentin (NEURONTIN) 100 MG capsule Take 1 capsule (100 mg total) by mouth in the morning, at noon, and at bedtime.   Marland Kitchen gemfibrozil (LOPID) 600 MG tablet Take 1 tablet (600 mg total) by mouth 2 (two) times daily with a meal.   . glimepiride (AMARYL) 2 MG tablet Take 1 tablet (2 mg total) by mouth at bedtime.   Marland Kitchen glucose blood (TRUE METRIX BLOOD GLUCOSE TEST) test strip Use as instructed twice daily diag E11.65   . hydrALAZINE (APRESOLINE) 100 MG tablet Take 1 tablet (100 mg total) by mouth 3 (three) times daily.   . hydrOXYzine (ATARAX) 10 MG/5ML syrup Take 5 mLs (10 mg total) by mouth 3 (three) times daily as needed for itching.   . insulin glargine, 1 Unit Dial, (TOUJEO) 300 UNIT/ML Solostar Pen Inject 26 Units into the skin daily.   Marland Kitchen ipratropium-albuterol (DUONEB) 0.5-2.5 (3) MG/3ML SOLN Take 3 mLs by nebulization every 6 (six) hours as needed.   . isosorbide mononitrate (IMDUR) 30 MG 24 hr tablet Take 1 tablet (30 mg total) by mouth daily.   Marland Kitchen linagliptin (TRADJENTA) 5 MG TABS tablet Take 1 tablet (5 mg total) by mouth daily.   Marland Kitchen loratadine (CLARITIN) 10 MG tablet Take 1  tablet (10 mg total) by mouth daily as needed for allergies.   . nitroGLYCERIN (NITROSTAT) 0.4 MG SL tablet Place 1 tablet (0.4 mg total) under the tongue every 5 (five) minutes x 3 doses as needed for chest pain.   Marland Kitchen NOVOFINE 32G X 6 MM MISC To use with [place] injections daily   . potassium chloride SA (KLOR-CON) 20 MEQ tablet Take 1 tablet (20 mEq total) by mouth 2 (two) times daily.   . rosuvastatin (CRESTOR) 10 MG tablet Take 1 tablet (10 mg total) by mouth daily.   . TRUEplus Lancets 33G MISC Use as directed twice daily diag E11.65   . vitamin B-12 (CYANOCOBALAMIN) 1000 MCG tablet Take 1 tablet (1,000 mcg total) by mouth daily.   Marland Kitchen zinc sulfate 220 (50 Zn) MG capsule Take 1 capsule (220 mg total) by mouth daily.   . [DISCONTINUED] metoprolol  succinate (TOPROL-XL) 50 MG 24 hr tablet Take 1 tablet (50 mg total) by mouth at bedtime. Take with or immediately following a meal. 01/30/2020: Last fill date 09/16/19 #90 day supply  . doxycycline (VIBRA-TABS) 100 MG tablet Take 1 tablet (100 mg total) by mouth 2 (two) times daily.   Marland Kitchen escitalopram (LEXAPRO) 10 MG tablet Take one tab po qd for anxiety with food   . omeprazole (PRILOSEC) 40 MG capsule Take 1 capsule (40 mg total) by mouth in the morning and at bedtime.   . sucralfate (CARAFATE) 1 g tablet Take 1 tablet (1 g total) by mouth 4 (four) times daily -  with meals and at bedtime.   Marland Kitchen tiotropium (SPIRIVA HANDIHALER) 18 MCG inhalation capsule Place 1 capsule (18 mcg total) into inhaler and inhale daily.   . traMADol (ULTRAM) 50 MG tablet Take 1 tablet (50 mg total) by mouth every 8 (eight) hours as needed for up to 5 days.    No facility-administered encounter medications on file as of 03/18/2020.    Surgical History: Past Surgical History:  Procedure Laterality Date  . BACK SURGERY    . CARDIAC CATHETERIZATION    . CHOLECYSTECTOMY    . COLONOSCOPY WITH PROPOFOL N/A 04/12/2019   Procedure: COLONOSCOPY WITH PROPOFOL;  Surgeon: [name], MD;  Location: [facility] ENDOSCOPY;  Service: Gastroenterology;  Laterality: N/A;  . ESOPHAGOGASTRODUODENOSCOPY N/A 04/12/2019   Procedure: ESOPHAGOGASTRODUODENOSCOPY (EGD);  Surgeon: [name], MD;  Location: [facility] ENDOSCOPY;  Service: Gastroenterology;  Laterality: N/A;  . ESOPHAGOGASTRODUODENOSCOPY (EGD) WITH PROPOFOL N/A 11/21/2019   Procedure: ESOPHAGOGASTRODUODENOSCOPY (EGD) WITH PROPOFOL;  Surgeon: [name], MD;  Location: [facility] ENDOSCOPY;  Service: Gastroenterology;  Laterality: N/A;  . FLEXIBLE BRONCHOSCOPY Bilateral 05/17/2019   Procedure: FLEXIBLE BRONCHOSCOPY;  Surgeon: [name], MD;  Location: [name] ORS;  Service: Pulmonary;  Laterality: Bilateral;  . left arm surgery    . nephrectomy Left   . PARATHYROIDECTOMY     . RIGHT HEART CATH N/A 03/29/2019   Procedure: RIGHT HEART CATH;  Surgeon: [name], MD;  Location: [facility] LAB;  Service: Cardiovascular;  Laterality: N/A;    Medical History: Past Medical History:  Diagnosis Date  . Allergy   . Coronary artery disease   . Diabetes mellitus without complication ([place])   . Diastolic CHF ([place])   . GERD (gastroesophageal reflux disease)   . Hyperlipidemia   . Hypertension   . Renal insufficiency   . Sleep apnea     Family History: Family History  Problem Relation Age of Onset  . Diabetes Mother  2003  . Lung cancer Father   . Diabetes Sister   . Hypertension Sister   . Heart disease Sister   . Cancer Sister   . Bone cancer Brother     Review of Systems  Constitutional: Negative for chills, fatigue and unexpected weight change.  HENT: Negative for congestion, postnasal drip, rhinorrhea, sneezing and sore throat.   Eyes: Negative for redness.  Respiratory: Positive for chest tightness and shortness of breath. Negative for cough.   Cardiovascular: Negative for chest pain and palpitations.  Gastrointestinal: Negative for abdominal pain, constipation, diarrhea, nausea and vomiting.  Genitourinary: Negative for dysuria and frequency.  Musculoskeletal: Negative for arthralgias, back pain, joint swelling and neck pain.  Skin: Positive for rash.  Neurological: Negative.  Negative for tremors and numbness.  Hematological: Negative for adenopathy. Does not bruise/bleed easily.  Psychiatric/Behavioral: Negative for behavioral problems (Depression), sleep disturbance and suicidal ideas. The patient is not nervous/anxious.      Vital Signs: BP (!) 158/90   Pulse 78   Temp (!) 97.1 F (36.2 C)   Resp 16   Ht _0  (1.803 m)   Wt 260 lb (117.9 kg)   SpO2 96%   BMI 36.26 kg/m    Physical Exam Constitutional:      General: He is not in acute distress.    Appearance: He is well-developed. He is not diaphoretic.   HENT:     Head: Normocephalic and atraumatic.     Mouth/Throat:     Pharynx: No oropharyngeal exudate.  Eyes:     Pupils: Pupils are equal, round, and reactive to light.  Neck:     Thyroid: No thyromegaly.     Vascular: No JVD.     Trachea: No tracheal deviation.  Cardiovascular:     Rate and Rhythm: Normal rate and regular rhythm.     Heart sounds: Normal heart sounds. No murmur heard.  No friction rub. No gallop.   Pulmonary:     Effort: Pulmonary effort is normal. No respiratory distress.     Breath sounds: No wheezing or rales.  Chest:     Chest wall: No tenderness.     Breasts:        Right: Mass present.      Comments: Right breast lump ///reproducable chest pain Abdominal:     General: Bowel sounds are normal.     Palpations: Abdomen is soft.  Musculoskeletal:        General: Normal range of motion.     Cervical back: Normal range of motion and neck supple.  Lymphadenopathy:     Cervical: No cervical adenopathy.  Skin:    General: Skin is warm.     Findings: No erythema.     Comments: Right axillary sweat glands abscess   Neurological:     Mental Status: He is alert and oriented to person, place, and time.     Cranial Nerves: No cranial nerve deficit.  Psychiatric:        Behavior: Behavior normal.        Thought Content: Thought content normal.        Judgment: Judgment normal.     LABS: Recent Results (from the past 2160 hour(s))  CBC with Differential     Status: Abnormal   Collection Time: 01/08/20  9:10 AM  Result Value Ref Range   WBC 6.4 4.0 - 10.5 K/uL   RBC 3.82 (L) 4.22 - 5.81 MIL/uL   Hemoglobin 11.1 (L) 13.0 - 17.0 g/dL  HCT 35.0 (L) 39 - 52 %   MCV 91.6 80.0 - 100.0 fL   MCH 29.1 26.0 - 34.0 pg   MCHC 31.7 30.0 - 36.0 g/dL   RDW 13.8 11.5 - 15.5 %   Platelets 201 150 - 400 K/uL   nRBC 0.0 0.0 - 0.2 %   Neutrophils Relative % 72 %   Neutro Abs 4.6 1.7 - 7.7 K/uL   Lymphocytes Relative 20 %   Lymphs Abs 1.3 0.7 - 4.0 K/uL    Monocytes Relative 6 %   Monocytes Absolute 0.4 0.1 - 1.0 K/uL   Eosinophils Relative 1 %   Eosinophils Absolute 0.1 0 - 0 K/uL   Basophils Relative 1 %   Basophils Absolute 0.1 0 - 0 K/uL   Immature Granulocytes 0 %   Abs Immature Granulocytes 0.02 0.00 - 0.07 K/uL    Comment: Performed at [facility], [address] [place], [place] [address]  Comprehensive metabolic panel     Status: Abnormal   Collection Time: 01/08/20  9:10 AM  Result Value Ref Range   Sodium 138 135 - 145 mmol/L   Potassium 4.3 3.5 - 5.1 mmol/L   Chloride 103 98 - 111 mmol/L   CO2 23 22 - 32 mmol/L   Glucose, Bld 268 (H) 70 - 99 mg/dL    Comment: Glucose reference range applies only to samples taken after fasting for at least 8 hours.   BUN 58 (H) 8 - 23 mg/dL   Creatinine, Ser 1.96 (H) 0.61 - 1.24 mg/dL   Calcium 9.0 8.9 - 10.3 mg/dL   Total Protein 7.3 6.5 - 8.1 g/dL   Albumin 3.7 3.5 - 5.0 g/dL   AST 19 15 - 41 U/L   ALT 15 0 - 44 U/L   Alkaline Phosphatase 73 38 - 126 U/L   Total Bilirubin 0.6 0.3 - 1.2 mg/dL   GFR calc non Af Amer 35 (L) >60 mL/min   GFR calc Af Amer 40 (L) >60 mL/min   Anion gap 12 5 - 15    Comment: Performed at [facility], [address], [place], [place] [address]  Basic metabolic panel     Status: Abnormal   Collection Time: 01/09/20 12:38 AM  Result Value Ref Range   Sodium 144 135 - 145 mmol/L   Potassium 3.7 3.5 - 5.1 mmol/L   Chloride 107 98 - 111 mmol/L   CO2 24 22 - 32 mmol/L   Glucose, Bld 166 (H) 70 - 99 mg/dL    Comment: Glucose reference range applies only to samples taken after fasting for at least 8 hours.   BUN 54 (H) 8 - 23 mg/dL   Creatinine, Ser 2.28 (H) 0.61 - 1.24 mg/dL   Calcium 9.1 8.9 - 10.3 mg/dL   GFR calc non Af Amer 29 (L) >60 mL/min   GFR calc Af Amer 33 (L) >60 mL/min   Anion gap 13 5 - 15    Comment: Performed at [facility], [address] [place], [place] [address]  CBC with Differential     Status:  Abnormal   Collection Time: 01/09/20 12:38 AM  Result Value Ref Range   WBC 7.8 4.0 - 10.5 K/uL   RBC 4.15 (L) 4.22 - 5.81 MIL/uL   Hemoglobin 11.8 (L) 13.0 - 17.0 g/dL   HCT 37.8 (L) 39 - 52 %   MCV 91.1 80.0 - 100.0 fL   MCH 28.4 26.0 - 34.0 pg  MCHC 31.2 30.0 - 36.0 g/dL   RDW 13.9 11.5 - 15.5 %   Platelets 224 150 - 400 K/uL   nRBC 0.0 0.0 - 0.2 %   Neutrophils Relative % 68 %   Neutro Abs 5.3 1.7 - 7.7 K/uL   Lymphocytes Relative 22 %   Lymphs Abs 1.7 0.7 - 4.0 K/uL   Monocytes Relative 7 %   Monocytes Absolute 0.6 0.1 - 1.0 K/uL   Eosinophils Relative 2 %   Eosinophils Absolute 0.2 0 - 0 K/uL   Basophils Relative 1 %   Basophils Absolute 0.1 0 - 0 K/uL   Immature Granulocytes 0 %   Abs Immature Granulocytes 0.02 0.00 - 0.07 K/uL    Comment: Performed at [facility], [address] [place], [place] [address]  Type and screen [place]     Status: None   Collection Time: 01/09/20 12:38 AM  Result Value Ref Range   ABO/RH(D) A POS    Antibody Screen NEG    Sample Expiration      01/12/2020,2359 Performed at [facility], [address] [place], [place] [address]   SARS Coronavirus 2 by RT PCR (hospital order, performed in [facility] hospital lab) Nasopharyngeal Nasopharyngeal Swab     Status: None   Collection Time: 01/09/20  1:20 AM   Specimen: Nasopharyngeal Swab  Result Value Ref Range   SARS Coronavirus 2 NEGATIVE NEGATIVE    Comment: (NOTE) SARS-CoV-2 target nucleic acids are NOT DETECTED.  The SARS-CoV-2 RNA is generally detectable in upper and lower respiratory specimens during the acute phase of infection. The lowest concentration of SARS-CoV-2 viral copies this assay can detect is 250 copies / mL. A negative result does not preclude SARS-CoV-2 infection and should not be used as the sole basis for treatment or other patient management decisions.  A negative result may occur with improper specimen collection /  handling, submission of specimen other than nasopharyngeal swab, presence of viral mutation(s) within the areas targeted by this assay, and inadequate number of viral copies (<250 copies / mL). A negative result must be combined with clinical observations, patient history, and epidemiological information.  Fact Sheet for Patients:   StrictlyIdeas.no  Fact Sheet for Healthcare Providers: BankingDealers.co.za  This test is not yet approved or  cleared by the [place] FDA and has been authorized for detection and/or diagnosis of SARS-CoV-2 by FDA under an Emergency Use Authorization (EUA).  This EUA will remain in effect (meaning this test can be used) for the duration of the COVID-19 declaration under Section 564(b)(1) of the Act, 21 U.S.C. section 360bbb-3(b)(1), unless the authorization is terminated or revoked sooner.  Performed at [facility], [address] [place], [place] [address]   Glucose, capillary     Status: Abnormal   Collection Time: 01/09/20  3:53 AM  Result Value Ref Range   Glucose-Capillary 125 (H) 70 - 99 mg/dL    Comment: Glucose reference range applies only to samples taken after fasting for at least 8 hours.  Hemoglobin and hematocrit, blood     Status: Abnormal   Collection Time: 01/09/20  3:56 AM  Result Value Ref Range   Hemoglobin 11.2 (L) 13.0 - 17.0 g/dL   HCT 34.7 (L) 39 - 52 %    Comment: Performed at [facility], [address] [place], [place] [address]  Basic metabolic panel     Status: Abnormal   Collection Time: 01/09/20  3:56 AM  Result Value Ref Range   Sodium 143 135 - 145 mmol/L   Potassium 3.6 3.5 - 5.1 mmol/L   Chloride 107 98 - 111 mmol/L   CO2 23 22 - 32 mmol/L   Glucose, Bld 132 (H) 70 - 99 mg/dL    Comment: Glucose reference range applies only to samples taken after fasting for at least 8 hours.   BUN 53 (H) 8 - 23 mg/dL   Creatinine, Ser 2.09 (H) 0.61 -  1.24 mg/dL   Calcium 8.8 (L) 8.9 - 10.3 mg/dL   GFR calc non Af Amer 32 (L) >60 mL/min   GFR calc Af Amer 37 (L) >60 mL/min   Anion gap 13 5 - 15    Comment: Performed at [facility], [address] [place], [place] [address]  Glucose, capillary     Status: Abnormal   Collection Time: 01/09/20  7:36 AM  Result Value Ref Range   Glucose-Capillary 105 (H) 70 - 99 mg/dL    Comment: Glucose reference range applies only to samples taken after fasting for at least 8 hours.  Glucose, capillary     Status: None   Collection Time: 01/09/20 11:56 AM  Result Value Ref Range   Glucose-Capillary 97 70 - 99 mg/dL    Comment: Glucose reference range applies only to samples taken after fasting for at least 8 hours.  Glucose, capillary     Status: Abnormal   Collection Time: 01/09/20  4:38 PM  Result Value Ref Range   Glucose-Capillary 152 (H) 70 - 99 mg/dL    Comment: Glucose reference range applies only to samples taken after fasting for at least 8 hours.  Glucose, capillary     Status: Abnormal   Collection Time: 01/09/20  8:08 PM  Result Value Ref Range   Glucose-Capillary 183 (H) 70 - 99 mg/dL    Comment: Glucose reference range applies only to samples taken after fasting for at least 8 hours.  Glucose, capillary     Status: Abnormal   Collection Time: 01/10/20 12:42 AM  Result Value Ref Range   Glucose-Capillary 139 (H) 70 - 99 mg/dL    Comment: Glucose reference range applies only to samples taken after fasting for at least 8 hours.  Basic metabolic panel     Status: Abnormal   Collection Time: 01/10/20  3:52 AM  Result Value Ref Range   Sodium 143 135 - 145 mmol/L   Potassium 4.0 3.5 - 5.1 mmol/L   Chloride 111 98 - 111 mmol/L   CO2 24 22 - 32 mmol/L   Glucose, Bld 140 (H) 70 - 99 mg/dL    Comment: Glucose reference range applies only to samples taken after fasting for at least 8 hours.   BUN 43 (H) 8 - 23 mg/dL   Creatinine, Ser 1.81 (H) 0.61 - 1.24 mg/dL   Calcium 9.0  8.9 - 10.3 mg/dL   GFR calc non Af Amer 38 (L) >60 mL/min   GFR calc Af Amer 44 (L) >60 mL/min   Anion gap 8 5 - 15    Comment: Performed at [facility], [address] [place], [place] [address]  CBC     Status: Abnormal   Collection Time: 01/10/20  3:52 AM  Result Value Ref Range   WBC 6.2 4.0 - 10.5 K/uL   RBC 3.79 (L) 4.22 - 5.81 MIL/uL   Hemoglobin 10.8 (L) 13.0 - 17.0 g/dL   HCT 34.6 (L) 39 - 52 %  MCV 91.3 80.0 - 100.0 fL   MCH 28.5 26.0 - 34.0 pg   MCHC 31.2 30.0 - 36.0 g/dL   RDW 13.4 11.5 - 15.5 %   Platelets 197 150 - 400 K/uL   nRBC 0.0 0.0 - 0.2 %    Comment: Performed at [facility], [address] [place], [place] [address]  Glucose, capillary     Status: Abnormal   Collection Time: 01/10/20  3:58 AM  Result Value Ref Range   Glucose-Capillary 130 (H) 70 - 99 mg/dL    Comment: Glucose reference range applies only to samples taken after fasting for at least 8 hours.  Glucose, capillary     Status: Abnormal   Collection Time: 01/10/20  7:53 AM  Result Value Ref Range   Glucose-Capillary 111 (H) 70 - 99 mg/dL    Comment: Glucose reference range applies only to samples taken after fasting for at least 8 hours.  Glucose, capillary     Status: Abnormal   Collection Time: 01/10/20 11:51 AM  Result Value Ref Range   Glucose-Capillary 207 (H) 70 - 99 mg/dL    Comment: Glucose reference range applies only to samples taken after fasting for at least 8 hours.  Basic metabolic panel     Status: Abnormal   Collection Time: 01/24/20  2:31 PM  Result Value Ref Range   Sodium 137 135 - 145 mmol/L   Potassium 3.6 3.5 - 5.1 mmol/L   Chloride 104 98 - 111 mmol/L   CO2 22 22 - 32 mmol/L   Glucose, Bld 109 (H) 70 - 99 mg/dL    Comment: Glucose reference range applies only to samples taken after fasting for at least 8 hours.   BUN 53 (H) 8 - 23 mg/dL   Creatinine, Ser 2.05 (H) 0.61 - 1.24 mg/dL   Calcium 8.8 (L) 8.9 - 10.3 mg/dL   GFR calc non Af Amer 33  (L) >60 mL/min   GFR calc Af Amer 38 (L) >60 mL/min   Anion gap 11 5 - 15    Comment: Performed at [facility], [address] [place], [place] [address]  CBC     Status: Abnormal   Collection Time: 01/24/20  2:31 PM  Result Value Ref Range   WBC 4.8 4.0 - 10.5 K/uL   RBC 3.88 (L) 4.22 - 5.81 MIL/uL   Hemoglobin 11.3 (L) 13.0 - 17.0 g/dL   HCT 34.8 (L) 39 - 52 %   MCV 89.7 80.0 - 100.0 fL   MCH 29.1 26.0 - 34.0 pg   MCHC 32.5 30.0 - 36.0 g/dL   RDW 13.5 11.5 - 15.5 %   Platelets 176 150 - 400 K/uL   nRBC 0.0 0.0 - 0.2 %    Comment: Performed at [facility], [place], [place] [address]  Troponin I (High Sensitivity)     Status: None   Collection Time: 01/24/20  2:31 PM  Result Value Ref Range   Troponin I (High Sensitivity) 9 <18 ng/L    Comment: (NOTE) Elevated high sensitivity troponin I (hsTnI) values and significant  changes across serial measurements may suggest ACS but many other  chronic and acute conditions are known to elevate hsTnI results.  Refer to the "Links" section for chest pain algorithms and additional  guidance. Performed at [facility], [address] [place], [place] [address]   Brain natriuretic peptide     Status: None   Collection Time: 01/24/20  2:31 PM  Result Value Ref Range   B Natriuretic Peptide 94.5 0.0 - 100.0 pg/mL    Comment: Performed at [facility], [place], [place] [address]  Troponin I (High Sensitivity)     Status: None   Collection Time: 01/24/20  5:11 PM  Result Value Ref Range   Troponin I (High Sensitivity) 11 <18 ng/L    Comment: (NOTE) Elevated high sensitivity troponin I (hsTnI) values and significant  changes across serial measurements may suggest ACS but many other  chronic and acute conditions are known to elevate hsTnI results.  Refer to the "Links" section for chest pain algorithms and additional  guidance. Performed at [facility],  [address] [place], [place] [address]   CBC     Status: Abnormal   Collection Time: 01/26/20  9:04 PM  Result Value Ref Range   WBC 4.4 4.0 - 10.5 K/uL   RBC 3.99 (L) 4.22 - 5.81 MIL/uL   Hemoglobin 11.6 (L) 13.0 - 17.0 g/dL   HCT 35.6 (L) 39 - 52 %   MCV 89.2 80.0 - 100.0 fL   MCH 29.1 26.0 - 34.0 pg   MCHC 32.6 30.0 - 36.0 g/dL   RDW 13.7 11.5 - 15.5 %   Platelets 158 150 - 400 K/uL   nRBC 0.0 0.0 - 0.2 %    Comment: Performed at [facility], [address] [place], [place] [address]  Comprehensive metabolic panel     Status: Abnormal   Collection Time: 01/26/20  9:04 PM  Result Value Ref Range   Sodium 137 135 - 145 mmol/L   Potassium 3.5 3.5 - 5.1 mmol/L   Chloride 104 98 - 111 mmol/L   CO2 21 (L) 22 - 32 mmol/L   Glucose, Bld 105 (H) 70 - 99 mg/dL    Comment: Glucose reference range applies only to samples taken after fasting for at least 8 hours.   BUN 45 (H) 8 - 23 mg/dL   Creatinine, Ser 2.28 (H) 0.61 - 1.24 mg/dL   Calcium 8.3 (L) 8.9 - 10.3 mg/dL   Total Protein 7.3 6.5 - 8.1 g/dL   Albumin 3.5 3.5 - 5.0 g/dL   AST 27 15 - 41 U/L   ALT 18 0 - 44 U/L   Alkaline Phosphatase 74 38 - 126 U/L   Total Bilirubin 0.7 0.3 - 1.2 mg/dL   GFR calc non Af Amer 29 (L) >60 mL/min   GFR calc Af Amer 33 (L) >60 mL/min   Anion gap 12 5 - 15    Comment: Performed at [facility], [place], [place] [address]  Troponin I (High Sensitivity)     Status: Abnormal   Collection Time: 01/26/20  9:04 PM  Result Value Ref Range   Troponin I (High Sensitivity) 20 (H) <18 ng/L    Comment: (NOTE) Elevated high sensitivity troponin I (hsTnI) values and significant  changes across serial measurements may suggest ACS but many other  chronic and acute conditions are known to elevate hsTnI results.  Refer to the "Links" section for chest pain algorithms and additional  guidance. Performed at [facility], [place],   [address]   Troponin I (High Sensitivity)     Status: Abnormal   Collection Time: 01/27/20  2:15 AM  Result Value Ref Range   Troponin I (High Sensitivity) 22 (H) <18 ng/L    Comment: (NOTE) Elevated high sensitivity troponin  I (hsTnI) values and significant  changes across serial measurements may suggest ACS but many other  chronic and acute conditions are known to elevate hsTnI results.  Refer to the "Links" section for chest pain algorithms and additional  guidance. Performed at [facility], [address] [place], [place] [address]   Basic metabolic panel     Status: Abnormal   Collection Time: 01/29/20  1:29 PM  Result Value Ref Range   Sodium 133 (L) 135 - 145 mmol/L   Potassium 3.6 3.5 - 5.1 mmol/L   Chloride 99 98 - 111 mmol/L   CO2 19 (L) 22 - 32 mmol/L   Glucose, Bld 130 (H) 70 - 99 mg/dL    Comment: Glucose reference range applies only to samples taken after fasting for at least 8 hours.   BUN 52 (H) 8 - 23 mg/dL   Creatinine, Ser 3.04 (H) 0.61 - 1.24 mg/dL   Calcium 8.7 (L) 8.9 - 10.3 mg/dL   GFR calc non Af Amer 20 (L) >60 mL/min   GFR calc Af Amer 24 (L) >60 mL/min   Anion gap 15 5 - 15    Comment: Performed at [facility], [address] [place], [place] [address]  CBC     Status: Abnormal   Collection Time: 01/29/20  1:29 PM  Result Value Ref Range   WBC 5.3 4.0 - 10.5 K/uL   RBC 4.42 4.22 - 5.81 MIL/uL   Hemoglobin 12.9 (L) 13.0 - 17.0 g/dL   HCT 38.1 (L) 39 - 52 %   MCV 86.2 80.0 - 100.0 fL   MCH 29.2 26.0 - 34.0 pg   MCHC 33.9 30.0 - 36.0 g/dL   RDW 13.6 11.5 - 15.5 %   Platelets 148 (L) 150 - 400 K/uL   nRBC 0.0 0.0 - 0.2 %    Comment: Performed at [facility], [address] [place], [place] [address]  TSH     Status: None   Collection Time: 01/29/20 10:27 PM  Result Value Ref Range   TSH 1.738 0.350 - 4.500 uIU/mL    Comment: Performed by a 3rd Generation assay with a functional sensitivity of <=0.01  uIU/mL. Performed at [facility], [place], [place] [address]   Troponin I (High Sensitivity)     Status: Abnormal   Collection Time: 01/29/20 10:27 PM  Result Value Ref Range   Troponin I (High Sensitivity) 44 (H) <18 ng/L    Comment: (NOTE) Elevated high sensitivity troponin I (hsTnI) values and significant  changes across serial measurements may suggest ACS but many other  chronic and acute conditions are known to elevate hsTnI results.  Refer to the "Links" section for chest pain algorithms and additional  guidance. Performed at [facility], [address] [place], [place] [address]   SARS Coronavirus 2 by RT PCR (hospital order, performed in [facility] hospital lab) Nasopharyngeal Nasopharyngeal Swab     Status: Abnormal   Collection Time: 01/29/20 10:27 PM   Specimen: Nasopharyngeal Swab  Result Value Ref Range   SARS Coronavirus 2 POSITIVE (A) NEGATIVE    Comment: RESULT CALLED TO, READ BACK BY AND VERIFIED WITH: Nadeen Landau [id number] ON 01/29/20 SKL (NOTE) SARS-CoV-2 target nucleic acids are DETECTED  SARS-CoV-2 RNA is generally detectable in upper respiratory specimens  during the acute phase of infection.  Positive results are indicative  of the presence of the identified virus, but do not rule out bacterial infection  or co-infection with other pathogens not detected by the test.  Clinical correlation with patient history and  other diagnostic information is necessary to determine patient infection status.  The expected result is negative.  Fact Sheet for Patients:   StrictlyIdeas.no   Fact Sheet for Healthcare Providers:   BankingDealers.co.za    This test is not yet approved or cleared by the [place] FDA and  has been authorized for detection and/or diagnosis of SARS-CoV-2 by FDA under an Emergency Use Authorization (EUA).  This EUA will remain in effect (meaning this  test  can be used) for the duration of  the COVID-19 declaration under Section 564(b)(1) of the Act, 21 U.S.C. section 360-bbb-3(b)(1), unless the authorization is terminated or revoked sooner.  Performed at [facility], [address] [place], [place] [address]   ABO/Rh     Status: None   Collection Time: 01/29/20 10:27 PM  Result Value Ref Range   ABO/RH(D)      A POS Performed at [facility], [address] [place], [place] [address]   Urinalysis, Complete w Microscopic     Status: Abnormal   Collection Time: 01/29/20 11:46 PM  Result Value Ref Range   Color, Urine YELLOW (A) YELLOW   APPearance CLOUDY (A) CLEAR   Specific Gravity, Urine 1.012 1.005 - 1.030   pH 5.0 5.0 - 8.0   Glucose, UA 50 (A) NEGATIVE mg/dL   Hgb urine dipstick NEGATIVE NEGATIVE   Bilirubin Urine NEGATIVE NEGATIVE   Ketones, ur NEGATIVE NEGATIVE mg/dL   Protein, ur >=300 (A) NEGATIVE mg/dL   Nitrite NEGATIVE NEGATIVE   Leukocytes,Ua NEGATIVE NEGATIVE   RBC / HPF 0-5 0 - 5 RBC/hpf   WBC, UA 0-5 0 - 5 WBC/hpf   Bacteria, UA RARE (A) NONE SEEN   Squamous Epithelial / LPF 0-5 0 - 5   Mucus PRESENT     Comment: Performed at [facility], [place], [place] [address]  Troponin I (High Sensitivity)     Status: Abnormal   Collection Time: 01/30/20 12:26 AM  Result Value Ref Range   Troponin I (High Sensitivity) 45 (H) <18 ng/L    Comment: (NOTE) Elevated high sensitivity troponin I (hsTnI) values and significant  changes across serial measurements may suggest ACS but many other  chronic and acute conditions are known to elevate hsTnI results.  Refer to the "Links" section for chest pain algorithms and additional  guidance. Performed at [facility], [address] [place], [place] [address]   Glucose, capillary     Status: Abnormal   Collection Time: 01/30/20 12:30 AM  Result Value Ref Range   Glucose-Capillary 156 (H) 70 - 99 mg/dL    Comment:  Glucose reference range applies only to samples taken after fasting for at least 8 hours.  Comprehensive metabolic panel     Status: Abnormal   Collection Time: 01/30/20  7:30 AM  Result Value Ref Range   Sodium 138 135 - 145 mmol/L   Potassium 3.4 (L) 3.5 - 5.1 mmol/L   Chloride 105 98 - 111 mmol/L   CO2 18 (L) 22 - 32 mmol/L   Glucose, Bld 151 (H) 70 - 99 mg/dL    Comment: Glucose reference range applies only to samples taken after fasting for at least 8 hours.   BUN 59 (H) 8 - 23 mg/dL   Creatinine, Ser 2.67 (H) 0.61 - 1.24 mg/dL   Calcium 8.2 (L) 8.9 - 10.3  mg/dL   Total Protein 6.7 6.5 - 8.1 g/dL   Albumin 3.0 (L) 3.5 - 5.0 g/dL   AST 40 15 - 41 U/L   ALT 21 0 - 44 U/L   Alkaline Phosphatase 60 38 - 126 U/L   Total Bilirubin 0.6 0.3 - 1.2 mg/dL   GFR calc non Af Amer 24 (L) >60 mL/min   GFR calc Af Amer 28 (L) >60 mL/min   Anion gap 15 5 - 15    Comment: Performed at [facility], [address] [place], [place] [address]  CBC     Status: Abnormal   Collection Time: 01/30/20  7:30 AM  Result Value Ref Range   WBC 3.7 (L) 4.0 - 10.5 K/uL   RBC 3.99 (L) 4.22 - 5.81 MIL/uL   Hemoglobin 11.6 (L) 13.0 - 17.0 g/dL   HCT 34.2 (L) 39 - 52 %   MCV 85.7 80.0 - 100.0 fL   MCH 29.1 26.0 - 34.0 pg   MCHC 33.9 30.0 - 36.0 g/dL   RDW 13.5 11.5 - 15.5 %   Platelets 121 (L) 150 - 400 K/uL   nRBC 0.0 0.0 - 0.2 %    Comment: Performed at [facility], [address] [place], [place] [address]  Hemoglobin A1c     Status: Abnormal   Collection Time: 01/30/20  7:30 AM  Result Value Ref Range   Hgb A1c MFr Bld 6.5 (H) 4.8 - 5.6 %    Comment: (NOTE) Pre diabetes:          5.7%-6.4%  Diabetes:              >6.4%  Glycemic control for   <7.0% adults with diabetes    Mean Plasma Glucose 139.85 mg/dL    Comment: Performed at [facility], [place] [address], [place], [place] [address]  Glucose, capillary     Status: Abnormal   Collection Time: 01/30/20  8:28 AM   Result Value Ref Range   Glucose-Capillary 137 (H) 70 - 99 mg/dL    Comment: Glucose reference range applies only to samples taken after fasting for at least 8 hours.  Glucose, capillary     Status: Abnormal   Collection Time: 01/30/20 11:50 AM  Result Value Ref Range   Glucose-Capillary 151 (H) 70 - 99 mg/dL    Comment: Glucose reference range applies only to samples taken after fasting for at least 8 hours.  Glucose, capillary     Status: Abnormal   Collection Time: 01/30/20  6:46 PM  Result Value Ref Range   Glucose-Capillary 239 (H) 70 - 99 mg/dL    Comment: Glucose reference range applies only to samples taken after fasting for at least 8 hours.  Glucose, capillary     Status: Abnormal   Collection Time: 01/30/20 10:25 PM  Result Value Ref Range   Glucose-Capillary 197 (H) 70 - 99 mg/dL    Comment: Glucose reference range applies only to samples taken after fasting for at least 8 hours.  C-reactive protein     Status: Abnormal   Collection Time: 01/31/20  4:27 AM  Result Value Ref Range   CRP 7.4 (H) <1.0 mg/dL    Comment: Performed at [place] [address], [place], [place] [address]  Fibrin derivatives D-Dimer [facility] only)     Status: Abnormal   Collection Time: 01/31/20  4:27 AM  Result Value Ref Range   Fibrin derivatives D-dimer ([name]) 1,000.63 (H)  0.00 - 499.00 ng/mL (FEU)    Comment: (NOTE) <> Exclusion of Venous Thromboembolism (VTE) - OUTPATIENT ONLY   (Emergency Department or [place])    0-499 ng/ml (FEU): With a low to intermediate pretest probability                      for VTE this test result excludes the diagnosis                      of VTE.   >499 ng/ml (FEU) : VTE not excluded; additional work up for VTE is                      required.  <> Testing on Inpatients and Evaluation of Disseminated Intravascular   Coagulation (DIC) Reference Range:   0-499 ng/ml (FEU) Performed at [facility], [address] [place], [place]  [address]   Ferritin     Status: None   Collection Time: 01/31/20  4:27 AM  Result Value Ref Range   Ferritin 243 24 - 336 ng/mL    Comment: Performed at [facility], [address] [place], [place] [address]  Comprehensive metabolic panel     Status: Abnormal   Collection Time: 01/31/20  4:27 AM  Result Value Ref Range   Sodium 136 135 - 145 mmol/L   Potassium 3.7 3.5 - 5.1 mmol/L   Chloride 108 98 - 111 mmol/L   CO2 20 (L) 22 - 32 mmol/L   Glucose, Bld 254 (H) 70 - 99 mg/dL    Comment: Glucose reference range applies only to samples taken after fasting for at least 8 hours.   BUN 51 (H) 8 - 23 mg/dL   Creatinine, Ser 2.36 (H) 0.61 - 1.24 mg/dL   Calcium 8.1 (L) 8.9 - 10.3 mg/dL   Total Protein 6.1 (L) 6.5 - 8.1 g/dL   Albumin 2.6 (L) 3.5 - 5.0 g/dL   AST 40 15 - 41 U/L   ALT 23 0 - 44 U/L   Alkaline Phosphatase 52 38 - 126 U/L   Total Bilirubin 0.4 0.3 - 1.2 mg/dL   GFR calc non Af Amer 28 (L) >60 mL/min   GFR calc Af Amer 32 (L) >60 mL/min   Anion gap 8 5 - 15    Comment: Performed at [facility], [address] [place], [place] [address]  Glucose, capillary     Status: Abnormal   Collection Time: 01/31/20  8:58 AM  Result Value Ref Range   Glucose-Capillary 197 (H) 70 - 99 mg/dL    Comment: Glucose reference range applies only to samples taken after fasting for at least 8 hours.  Glucose, capillary     Status: Abnormal   Collection Time: 01/31/20 11:31 AM  Result Value Ref Range   Glucose-Capillary 196 (H) 70 - 99 mg/dL    Comment: Glucose reference range applies only to samples taken after fasting for at least 8 hours.  Glucose, capillary     Status: Abnormal   Collection Time: 01/31/20  5:04 PM  Result Value Ref Range   Glucose-Capillary 300 (H) 70 - 99 mg/dL    Comment: Glucose reference range applies only to samples taken after fasting for at least 8 hours.   Comment 1 Notify RN   Glucose, capillary     Status: Abnormal   Collection Time:  01/31/20  9:33 PM  Result Value Ref Range   Glucose-Capillary  260 (H) 70 - 99 mg/dL    Comment: Glucose reference range applies only to samples taken after fasting for at least 8 hours.  C-reactive protein     Status: Abnormal   Collection Time: 02/01/20  5:21 AM  Result Value Ref Range   CRP 3.3 (H) <1.0 mg/dL    Comment: Performed at [place] [address] [place], [place] [address]  Fibrin derivatives D-Dimer [facility] only)     Status: Abnormal   Collection Time: 02/01/20  5:21 AM  Result Value Ref Range   Fibrin derivatives D-dimer ([name]) 795.84 (H) 0.00 - 499.00 ng/mL (FEU)    Comment: (NOTE) <> Exclusion of Venous Thromboembolism (VTE) - OUTPATIENT ONLY   (Emergency Department or [place])    0-499 ng/ml (FEU): With a low to intermediate pretest probability                      for VTE this test result excludes the diagnosis                      of VTE.   >499 ng/ml (FEU) : VTE not excluded; additional work up for VTE is                      required.  <> Testing on Inpatients and Evaluation of Disseminated Intravascular   Coagulation (DIC) Reference Range:   0-499 ng/ml (FEU) Performed at [facility], [address] [place], [place] [address]   Ferritin     Status: None   Collection Time: 02/01/20  5:21 AM  Result Value Ref Range   Ferritin 242 24 - 336 ng/mL    Comment: Performed at [facility], [address] [place], [place] [address]  Glucose, capillary     Status: Abnormal   Collection Time: 02/01/20  8:27 AM  Result Value Ref Range   Glucose-Capillary 135 (H) 70 - 99 mg/dL    Comment: Glucose reference range applies only to samples taken after fasting for at least 8 hours.  Glucose, capillary     Status: Abnormal   Collection Time: 02/01/20 11:44 AM  Result Value Ref Range   Glucose-Capillary 146 (H) 70 - 99 mg/dL    Comment: Glucose reference range applies only to samples taken after fasting for at least 8 hours.  Glucose,  capillary     Status: Abnormal   Collection Time: 02/01/20  4:30 PM  Result Value Ref Range   Glucose-Capillary 284 (H) 70 - 99 mg/dL    Comment: Glucose reference range applies only to samples taken after fasting for at least 8 hours.  Glucose, capillary     Status: Abnormal   Collection Time: 02/01/20  8:49 PM  Result Value Ref Range   Glucose-Capillary 321 (H) 70 - 99 mg/dL    Comment: Glucose reference range applies only to samples taken after fasting for at least 8 hours.  C-reactive protein     Status: Abnormal   Collection Time: 02/02/20  6:30 AM  Result Value Ref Range   CRP 1.0 (H) <1.0 mg/dL    Comment: Performed at [place] [address], [place], [place] [address]  Fibrin derivatives D-Dimer [facility] only)     Status: Abnormal   Collection Time: 02/02/20  6:30 AM  Result Value Ref Range   Fibrin derivatives D-dimer ([name]) 625.79 (H) 0.00 - 499.00 ng/mL (FEU)    Comment: (NOTE) <>  Exclusion of Venous Thromboembolism (VTE) - OUTPATIENT ONLY   (Emergency Department or [place])    0-499 ng/ml (FEU): With a low to intermediate pretest probability                      for VTE this test result excludes the diagnosis                      of VTE.   >499 ng/ml (FEU) : VTE not excluded; additional work up for VTE is                      required.  <> Testing on Inpatients and Evaluation of Disseminated Intravascular   Coagulation (DIC) Reference Range:   0-499 ng/ml (FEU) Performed at [facility], [address] [place], [place] [address]   Ferritin     Status: None   Collection Time: 02/02/20  6:30 AM  Result Value Ref Range   Ferritin 205 24 - 336 ng/mL    Comment: Performed at [facility], [address], [place], [place] [address]  Basic metabolic panel     Status: Abnormal   Collection Time: 02/02/20  6:30 AM  Result Value Ref Range   Sodium 138 135 - 145 mmol/L   Potassium 4.1 3.5 - 5.1 mmol/L   Chloride 108 98 - 111 mmol/L   CO2 20  (L) 22 - 32 mmol/L   Glucose, Bld 186 (H) 70 - 99 mg/dL    Comment: Glucose reference range applies only to samples taken after fasting for at least 8 hours.   BUN 47 (H) 8 - 23 mg/dL   Creatinine, Ser 1.67 (H) 0.61 - 1.24 mg/dL   Calcium 8.5 (L) 8.9 - 10.3 mg/dL   GFR calc non Af Amer 42 (L) >60 mL/min   GFR calc Af Amer 49 (L) >60 mL/min   Anion gap 10 5 - 15    Comment: Performed at [facility], [address] [place],  [address]  Glucose, capillary     Status: Abnormal   Collection Time: 02/02/20  8:37 AM  Result Value Ref Range   Glucose-Capillary 143 (H) 70 - 99 mg/dL    Comment: Glucose reference range applies only to samples taken after fasting for at least 8 hours.  Glucose, capillary     Status: Abnormal   Collection Time: 02/02/20 12:28 PM  Result Value Ref Range   Glucose-Capillary 182 (H) 70 - 99 mg/dL    Comment: Glucose reference range applies only to samples taken after fasting for at least 8 hours.  Glucose, capillary     Status: Abnormal   Collection Time: 02/02/20  5:18 PM  Result Value Ref Range   Glucose-Capillary 276 (H) 70 - 99 mg/dL    Comment: Glucose reference range applies only to samples taken after fasting for at least 8 hours.  Glucose, capillary     Status: Abnormal   Collection Time: 02/02/20  8:44 PM  Result Value Ref Range   Glucose-Capillary 259 (H) 70 - 99 mg/dL    Comment: Glucose reference range applies only to samples taken after fasting for at least 8 hours.  C-reactive protein     Status: None   Collection Time: 02/03/20  5:56 AM  Result Value Ref Range   CRP 0.9 <1.0 mg/dL    Comment: Performed at [facility], [place] [address], [place],  [address]  Fibrin  derivatives D-Dimer ([name] only)     Status: Abnormal   Collection Time: 02/03/20  5:56 AM  Result Value Ref Range   Fibrin derivatives D-dimer ([name]) 584.71 (H) 0.00 - 499.00 ng/mL (FEU)    Comment: (NOTE) <> Exclusion of Venous Thromboembolism (VTE) -  OUTPATIENT ONLY   (Emergency Department or [place])    0-499 ng/ml (FEU): With a low to intermediate pretest probability                      for VTE this test result excludes the diagnosis                      of VTE.   >499 ng/ml (FEU) : VTE not excluded; additional work up for VTE is                      required.  <> Testing on Inpatients and Evaluation of Disseminated Intravascular   Coagulation (DIC) Reference Range:   0-499 ng/ml (FEU) Performed at [facility], [address] [place], [place] [address]   Ferritin     Status: None   Collection Time: 02/03/20  5:56 AM  Result Value Ref Range   Ferritin 185 24 - 336 ng/mL    Comment: Performed at [facility], [address] [place], [place] [address]  Glucose, capillary     Status: Abnormal   Collection Time: 02/03/20 10:03 AM  Result Value Ref Range   Glucose-Capillary 232 (H) 70 - 99 mg/dL    Comment: Glucose reference range applies only to samples taken after fasting for at least 8 hours.   Comment 1 Notify RN   Glucose, capillary     Status: Abnormal   Collection Time: 02/03/20 12:37 PM  Result Value Ref Range   Glucose-Capillary 235 (H) 70 - 99 mg/dL    Comment: Glucose reference range applies only to samples taken after fasting for at least 8 hours.  Basic metabolic panel     Status: Abnormal   Collection Time: 02/12/20  3:41 PM  Result Value Ref Range   Sodium 136 135 - 145 mmol/L   Potassium 4.1 3.5 - 5.1 mmol/L   Chloride 104 98 - 111 mmol/L   CO2 20 (L) 22 - 32 mmol/L   Glucose, Bld 150 (H) 70 - 99 mg/dL    Comment: Glucose reference range applies only to samples taken after fasting for at least 8 hours.   BUN 46 (H) 8 - 23 mg/dL   Creatinine, Ser 2.12 (H) 0.61 - 1.24 mg/dL   Calcium 8.8 (L) 8.9 - 10.3 mg/dL   GFR calc non Af Amer 31 (L) >60 mL/min   GFR calc Af Amer 36 (L) >60 mL/min   Anion gap 12 5 - 15    Comment: Performed at [facility], [address]  [place], [place] [address]  CBC     Status: Abnormal   Collection Time: 02/12/20  3:41 PM  Result Value Ref Range   WBC 10.1 4.0 - 10.5 K/uL   RBC 4.00 (L) 4.22 - 5.81 MIL/uL   Hemoglobin 11.6 (L) 13.0 - 17.0 g/dL   HCT 36.1 (L) 39 - 52 %   MCV 90.3 80.0 - 100.0 fL   MCH 29.0 26.0 - 34.0 pg   MCHC 32.1 30.0 - 36.0 g/dL   RDW 13.8 11.5 - 15.5 %   Platelets 242 150 - 400 K/uL  nRBC 0.0 0.0 - 0.2 %    Comment: Performed at [facility], [place], [place] [address]  Troponin I (High Sensitivity)     Status: None   Collection Time: 02/12/20  3:41 PM  Result Value Ref Range   Troponin I (High Sensitivity) 11 <18 ng/L    Comment: (NOTE) Elevated high sensitivity troponin I (hsTnI) values and significant  changes across serial measurements may suggest ACS but many other  chronic and acute conditions are known to elevate hsTnI results.  Refer to the "Links" section for chest pain algorithms and additional  guidance. Performed at [facility], [address] [place], [place] [address]   Basic metabolic panel     Status: Abnormal   Collection Time: 02/18/20  5:30 PM  Result Value Ref Range   Sodium 137 135 - 145 mmol/L   Potassium 3.7 3.5 - 5.1 mmol/L   Chloride 107 98 - 111 mmol/L   CO2 21 (L) 22 - 32 mmol/L   Glucose, Bld 121 (H) 70 - 99 mg/dL    Comment: Glucose reference range applies only to samples taken after fasting for at least 8 hours.   BUN 38 (H) 8 - 23 mg/dL   Creatinine, Ser 1.61 (H) 0.61 - 1.24 mg/dL   Calcium 9.7 8.9 - 10.3 mg/dL   GFR calc non Af Amer 44 (L) >60 mL/min   GFR calc Af Amer 51 (L) >60 mL/min   Anion gap 9 5 - 15    Comment: Performed at [facility], [address] [place], [place] [address]  CBC     Status: Abnormal   Collection Time: 02/18/20  5:30 PM  Result Value Ref Range   WBC 7.3 4.0 - 10.5 K/uL   RBC 3.93 (L) 4.22 - 5.81 MIL/uL   Hemoglobin 11.6 (L) 13.0 - 17.0 g/dL   HCT 33.9 (L) 39 - 52 %   MCV  86.3 80.0 - 100.0 fL   MCH 29.5 26.0 - 34.0 pg   MCHC 34.2 30.0 - 36.0 g/dL   RDW 13.5 11.5 - 15.5 %   Platelets 218 150 - 400 K/uL   nRBC 0.0 0.0 - 0.2 %    Comment: Performed at [facility], [address] [place], [place] [address]  Troponin I (High Sensitivity)     Status: None   Collection Time: 02/18/20  5:30 PM  Result Value Ref Range   Troponin I (High Sensitivity) 11 <18 ng/L    Comment: (NOTE) Elevated high sensitivity troponin I (hsTnI) values and significant  changes across serial measurements may suggest ACS but many other  chronic and acute conditions are known to elevate hsTnI results.  Refer to the "Links" section for chest pain algorithms and additional  guidance. Performed at [facility], [place], [place] [address]   Troponin I (High Sensitivity)     Status: None   Collection Time: 02/18/20 10:52 PM  Result Value Ref Range   Troponin I (High Sensitivity) 11 <18 ng/L    Comment: (NOTE) Elevated high sensitivity troponin I (hsTnI) values and significant  changes across serial measurements may suggest ACS but many other  chronic and acute conditions are known to elevate hsTnI results.  Refer to the "Links" section for chest pain algorithms and additional  guidance. Performed at [facility], [address] [place], [place] [address]   POCT glycosylated hemoglobin (Hb A1C)     Status: Abnormal  Collection Time: 03/18/20 11:57 AM  Result Value Ref Range   Hemoglobin A1C 6.4 (A) 4.0 - 5.6 %   HbA1c POC (<> result, manual entry)     HbA1c, POC (prediabetic range)     HbA1c, POC (controlled diabetic range)     Assessment/Plan: 1. Encounter for general adult medical examination with abnormal findings All PHM is update according to guidelines. Pt needs to have home health for management of medications   2. Type 2 diabetes mellitus with stage 3b chronic kidney disease, with long-term current use of insulin  [facility]) Medical management of medications  - POCT glycosylated hemoglobin (Hb A1C) - Ambulatory referral to Ophthalmology  3. GAD (generalized anxiety disorder) Start Lexapro and will monitor symptoms   4. Hidradenitis Severe infection and abscess of right axilla  - doxycycline (VIBRA-TABS) 100 MG tablet; Take 1 tablet (100 mg total) by mouth 2 (two) times daily.  Dispense: 20 tablet; Refill: 0  5. Atherosclerosis of aorta (HCC) Pt is on Crestor   6. Lump in upper outer quadrant of right breast MM Digital Diagnostic Unilat R; Future  7. Right-sided chest pain Seems to be pleuritic chest pain   - DG Chest 2 View; Future - traMADol (ULTRAM) 50 MG tablet; Take 1 tablet (50 mg total) by mouth every 8 (eight) hours as needed for up to 5 days.  Dispense: 15 tablet; Refill: 0  8. Mixed hyperlipidemia Continue Crestor as before   9. Chronic obstructive pulmonary disease, unspecified COPD type ([place]) Stable   General Counseling: Favor verbalizes understanding of the findings of todays visit and agrees with plan of treatment. I have discussed any further diagnostic evaluation that may be needed or ordered today. We also reviewed his medications today. he has been encouraged to call the office with any questions or concerns that should arise related to todays visit.  Counseling: Cardiac risk factor modification:  1. Control blood pressure. 2. Exercise as prescribed. 3. Follow low sodium, low fat diet. and low fat and low cholestrol diet. 4. Take ASA 11m once a day. 5. Restricted calories diet to lose weight.   Orders Placed This Encounter  Procedures  . MM Digital Diagnostic Unilat R  . DG Chest 2 View  . Ambulatory referral to Ophthalmology  . POCT glycosylated hemoglobin (Hb A1C)    Meds ordered this encounter  Medications  . doxycycline (VIBRA-TABS) 100 MG tablet    Sig: Take 1 tablet (100 mg total) by mouth 2 (two) times daily.    Dispense:  20 tablet    Refill:  0  .  escitalopram (LEXAPRO) 10 MG tablet    Sig: Take one tab po qd for anxiety with food    Dispense:  90 tablet    Refill:  3  . traMADol (ULTRAM) 50 MG tablet    Sig: Take 1 tablet (50 mg total) by mouth every 8 (eight) hours as needed for up to 5 days.    Dispense:  15 tablet    Refill:  0    Total time spent:35 Minutes  Time spent includes review of chart, medications, test results, and follow up plan with the patient.   FLavera Guise MD  Internal Medicine

## 2020-03-19 ENCOUNTER — Telehealth: Payer: Self-pay

## 2020-03-19 ENCOUNTER — Other Ambulatory Visit: Payer: Self-pay

## 2020-03-19 NOTE — Telephone Encounter (Signed)
Attempted to schedule eye appt with all local companies and no where will take him as he has been a none compliant pt with them for his yearly eye exam

## 2020-03-24 ENCOUNTER — Ambulatory Visit
Admission: RE | Admit: 2020-03-24 | Discharge: 2020-03-24 | Disposition: A | Discharge status: Home / Self Care | Payer: Medicare PPO | Source: Ambulatory Visit | Attending: Internal Medicine | Admitting: Internal Medicine

## 2020-03-24 ENCOUNTER — Ambulatory Visit
Admission: RE | Admit: 2020-03-24 | Discharge: 2020-03-24 | Disposition: A | Discharge status: Home / Self Care | Payer: Medicare PPO | Attending: Internal Medicine | Admitting: Internal Medicine

## 2020-03-24 ENCOUNTER — Other Ambulatory Visit: Payer: Self-pay | Admitting: Internal Medicine

## 2020-03-24 ENCOUNTER — Other Ambulatory Visit: Payer: Self-pay

## 2020-03-24 DIAGNOSIS — R079 Chest pain, unspecified: Secondary | ICD-10-CM | POA: Insufficient documentation

## 2020-03-25 ENCOUNTER — Other Ambulatory Visit: Payer: Self-pay

## 2020-03-25 MED ORDER — INSULIN PEN NEEDLE 32G X 6 MM MISC: 1 refills | Status: DC

## 2020-03-30 ENCOUNTER — Telehealth: Payer: Self-pay

## 2020-03-31 ENCOUNTER — Telehealth: Payer: Self-pay

## 2020-03-31 NOTE — Telephone Encounter (Signed)
Left message on VM that xray was normal.  dbs

## 2020-03-31 NOTE — Telephone Encounter (Signed)
Normal cxr

## 2020-04-07 ENCOUNTER — Ambulatory Visit: Payer: Medicare PPO | Admitting: Nurse Practitioner

## 2020-04-07 ENCOUNTER — Encounter: Payer: Self-pay | Admitting: Nurse Practitioner

## 2020-04-07 ENCOUNTER — Other Ambulatory Visit: Payer: Self-pay

## 2020-04-07 VITALS — BP 179/83 | HR 66 | Temp 97.2°F | Resp 16 | Ht 71.0 in | Wt 265.6 lb

## 2020-04-07 DIAGNOSIS — Z794 Long term (current) use of insulin: Secondary | ICD-10-CM | POA: Diagnosis not present

## 2020-04-07 DIAGNOSIS — N1832 Chronic kidney disease, stage 3b: Secondary | ICD-10-CM

## 2020-04-07 DIAGNOSIS — K219 Gastro-esophageal reflux disease without esophagitis: Secondary | ICD-10-CM

## 2020-04-07 DIAGNOSIS — I10 Essential (primary) hypertension: Secondary | ICD-10-CM

## 2020-04-07 DIAGNOSIS — E1122 Type 2 diabetes mellitus with diabetic chronic kidney disease: Secondary | ICD-10-CM | POA: Diagnosis not present

## 2020-04-07 DIAGNOSIS — L732 Hidradenitis suppurativa: Secondary | ICD-10-CM | POA: Diagnosis not present

## 2020-04-07 MED ORDER — CLINDAMYCIN PHOSPHATE 1 % EX GEL: Freq: Two times a day (BID) | CUTANEOUS | 2 refills | Status: DC

## 2020-04-07 MED ORDER — METHYLPREDNISOLONE 4 MG PO TBPK: ORAL_TABLET | ORAL | 0 refills | Status: DC

## 2020-04-07 MED ORDER — OMEPRAZOLE 40 MG PO CPDR: 40.0000 mg | DELAYED_RELEASE_CAPSULE | Freq: Two times a day (BID) | ORAL | 2 refills | Status: DC

## 2020-04-07 NOTE — Progress Notes (Signed)
[facility] [address], [place] [address]  Internal MEDICINE  Office Visit Note  Patient Name: Larry Callahan  [id number]  [id number]  Date of Service: 04/19/2020  Chief Complaint  Patient presents with  . Follow-up  . Diabetes  . Gastroesophageal Reflux  . Hyperlipidemia  . Hypertension  . Quality Metric Gaps    tetnaus,hep C  . controlled substance form    reviewed with PT    The patient is here for follow up visit. Today, he states that he has painful cyst like lesions under the right arm, going down the right chest. He states that he tried to see dermatologist for this ant [facility], however, he was told he needed to be referred by his PCP. He states that  He was treated with doxycycline for this at his most recent visit. States that lesions did clear up a little while on antibiotics, but immediately started to get more severe after completing the antibiotics. His blood pressure is mildly elevated today.       Current Medication: Outpatient Encounter Medications as of 04/07/2020  Medication Sig  . acetaminophen (TYLENOL) 325 MG tablet Take 2 tablets (650 mg total) by mouth every 4 (four) hours as needed for headache or mild pain.  Marland Kitchen albuterol (VENTOLIN HFA) 108 (90 Base) MCG/ACT inhaler Inhale 2 puffs into the lungs every 6 (six) hours as needed for wheezing or shortness of breath.  Marland Kitchen amLODipine (NORVASC) 10 MG tablet Take 1 tablet (10 mg total) by mouth daily.  Marland Kitchen ascorbic acid (VITAMIN C) 500 MG tablet Take 1 tablet (500 mg total) by mouth daily.  . benzonatate (TESSALON PERLES) 100 MG capsule Take 1 capsule (100 mg total) by mouth 3 (three) times daily as needed for cough.  . Blood Glucose Calibration (TRUE METRIX LEVEL 1) Low SOLN Use as directed  . Blood Glucose Monitoring Suppl (TRUE METRIX AIR GLUCOSE METER) DEVI 1 Device by Does not apply route 2 (two) times daily.  Marland Kitchen doxycycline (VIBRA-TABS) 100 MG tablet Take 1 tablet (100 mg total) by mouth  2 (two) times daily.  Marland Kitchen escitalopram (LEXAPRO) 10 MG tablet Take one tab po qd for anxiety with food  . ferrous gluconate (FERGON) 324 MG tablet Take 1 tablet (324 mg total) by mouth daily with breakfast.  . furosemide (LASIX) 40 MG tablet Take one to tabs once or twice days for fluid // swelling  . gabapentin (NEURONTIN) 100 MG capsule Take 1 capsule (100 mg total) by mouth in the morning, at noon, and at bedtime.  Marland Kitchen gemfibrozil (LOPID) 600 MG tablet Take 1 tablet (600 mg total) by mouth 2 (two) times daily with a meal.  . glimepiride (AMARYL) 2 MG tablet Take 1 tablet (2 mg total) by mouth at bedtime.  Marland Kitchen glucose blood (TRUE METRIX BLOOD GLUCOSE TEST) test strip Use as instructed twice daily diag E11.65  . hydrALAZINE (APRESOLINE) 100 MG tablet Take 1 tablet (100 mg total) by mouth 3 (three) times daily.  . hydrOXYzine (ATARAX) 10 MG/5ML syrup Take 5 mLs (10 mg total) by mouth 3 (three) times daily as needed for itching.  . insulin glargine, 1 Unit Dial, (TOUJEO) 300 UNIT/ML Solostar Pen Inject 26 Units into the skin daily.  . Insulin Pen Needle 32G X 6 MM MISC Use as directed  . ipratropium-albuterol (DUONEB) 0.5-2.5 (3) MG/3ML SOLN Take 3 mLs by nebulization every 6 (six) hours as needed.  . isosorbide mononitrate (IMDUR) 30 MG 24 hr tablet Take 1  tablet (30 mg total) by mouth daily.  Marland Kitchen linagliptin (TRADJENTA) 5 MG TABS tablet Take 1 tablet (5 mg total) by mouth daily.  Marland Kitchen loratadine (CLARITIN) 10 MG tablet Take 1 tablet (10 mg total) by mouth daily as needed for allergies.  . metoprolol succinate (TOPROL-XL) 50 MG 24 hr tablet Take 1 tablet (50 mg total) by mouth at bedtime. Take with or immediately following a meal.  . nitroGLYCERIN (NITROSTAT) 0.4 MG SL tablet Place 1 tablet (0.4 mg total) under the tongue every 5 (five) minutes x 3 doses as needed for chest pain.  . rosuvastatin (CRESTOR) 10 MG tablet Take 1 tablet (10 mg total) by mouth daily.  . TRUEplus Lancets 33G MISC Use as directed  twice daily diag E11.65  . vitamin B-12 (CYANOCOBALAMIN) 1000 MCG tablet Take 1 tablet (1,000 mcg total) by mouth daily.  Marland Kitchen zinc sulfate 220 (50 Zn) MG capsule Take 1 capsule (220 mg total) by mouth daily.  . [DISCONTINUED] potassium chloride SA (KLOR-CON) 20 MEQ tablet Take 1 tablet (20 mEq total) by mouth 2 (two) times daily.  . clindamycin (CLINDAGEL) 1 % gel Apply topically 2 (two) times daily.  . methylPREDNISolone (MEDROL) 4 MG TBPK tablet Take by mouth as directed for 6 days  . omeprazole (PRILOSEC) 40 MG capsule Take 1 capsule (40 mg total) by mouth in the morning and at bedtime.  Marland Kitchen tiotropium (SPIRIVA HANDIHALER) 18 MCG inhalation capsule Place 1 capsule (18 mcg total) into inhaler and inhale daily.  . [DISCONTINUED] omeprazole (PRILOSEC) 40 MG capsule Take 1 capsule (40 mg total) by mouth in the morning and at bedtime.   No facility-administered encounter medications on file as of 04/07/2020.    Surgical History: Past Surgical History:  Procedure Laterality Date  . BACK SURGERY    . CARDIAC CATHETERIZATION    . CHOLECYSTECTOMY    . COLONOSCOPY WITH PROPOFOL N/A 04/12/2019   Procedure: COLONOSCOPY WITH PROPOFOL;  Surgeon: [name], MD;  Location: [facility] ENDOSCOPY;  Service: Gastroenterology;  Laterality: N/A;  . ESOPHAGOGASTRODUODENOSCOPY N/A 04/12/2019   Procedure: ESOPHAGOGASTRODUODENOSCOPY (EGD);  Surgeon: [name], MD;  Location: [facility] ENDOSCOPY;  Service: Gastroenterology;  Laterality: N/A;  . ESOPHAGOGASTRODUODENOSCOPY (EGD) WITH PROPOFOL N/A 11/21/2019   Procedure: ESOPHAGOGASTRODUODENOSCOPY (EGD) WITH PROPOFOL;  Surgeon: [name], MD;  Location: [facility] ENDOSCOPY;  Service: Gastroenterology;  Laterality: N/A;  . FLEXIBLE BRONCHOSCOPY Bilateral 05/17/2019   Procedure: FLEXIBLE BRONCHOSCOPY;  Surgeon: [name], MD;  Location: [name] ORS;  Service: Pulmonary;  Laterality: Bilateral;  . left arm surgery    . nephrectomy Left   . PARATHYROIDECTOMY     . RIGHT HEART CATH N/A 03/29/2019   Procedure: RIGHT HEART CATH;  Surgeon: [name], MD;  Location: [facility] LAB;  Service: Cardiovascular;  Laterality: N/A;    Medical History: Past Medical History:  Diagnosis Date  . Allergy   . Coronary artery disease   . Diabetes mellitus without complication ([place])   . Diastolic CHF ([place])   . GERD (gastroesophageal reflux disease)   . Hyperlipidemia   . Hypertension   . Renal insufficiency   . Sleep apnea     Family History: Family History  Problem Relation Age of Onset  . Diabetes Mother        2003  . Lung cancer Father   . Diabetes Sister   . Hypertension Sister   . Heart disease Sister   . Cancer Sister   . Bone cancer Brother  Social History   Socioeconomic History  . Marital status: Single    Spouse name: Not on file  . Number of children: Not on file  . Years of education: Not on file  . Highest education level: Not on file  Occupational History  . Not on file  Tobacco Use  . Smoking status: Former Smoker    Types: Cigarettes  . Smokeless tobacco: Former Systems developer    Types: Secondary school teacher  . Vaping Use: Never used  Substance and Sexual Activity  . Alcohol use: No  . Drug use: No  . Sexual activity: Not on file  Other Topics Concern  . Not on file  Social History Narrative  . Not on file   Social Determinants of Health   Financial Resource Strain:   . Difficulty of Paying Living Expenses: Not on file  Food Insecurity:   . Worried About Charity fundraiser in the Last Year: Not on file  . Ran Out of Food in the Last Year: Not on file  Transportation Needs:   . Lack of Transportation (Medical): Not on file  . Lack of Transportation (Non-Medical): Not on file  Physical Activity:   . Days of Exercise per Week: Not on file  . Minutes of Exercise per Session: Not on file  Stress:   . Feeling of Stress : Not on file  Social Connections:   . Frequency of Communication with Friends and  Family: Not on file  . Frequency of Social Gatherings with Friends and Family: Not on file  . Attends Religious Services: Not on file  . Active Member of Clubs or Organizations: Not on file  . Attends Archivist Meetings: Not on file  . Marital Status: Not on file  Intimate Partner Violence:   . Fear of Current or Ex-Partner: Not on file  . Emotionally Abused: Not on file  . Physically Abused: Not on file  . Sexually Abused: Not on file      Review of Systems  Constitutional: Negative for activity change, chills, fatigue and unexpected weight change.  HENT: Negative for congestion, postnasal drip, rhinorrhea, sneezing and sore throat.   Respiratory: Positive for shortness of breath. Negative for cough, chest tightness and wheezing.   Cardiovascular: Negative for chest pain and palpitations.       Blood pressure moderately elevated today.   Gastrointestinal: Negative for abdominal pain, constipation, diarrhea, nausea and vomiting.       Increased acid reflux.   Endocrine: Negative for cold intolerance, heat intolerance, polydipsia and polyuria.  Musculoskeletal: Negative for arthralgias, back pain, joint swelling and neck pain.  Skin: Negative for rash.       Cyst-like lesions under the right arm and on right side of upper chest which are very tender.   Allergic/Immunologic: Negative for environmental allergies.  Neurological: Negative for dizziness, tremors, numbness and headaches.  Hematological: Negative for adenopathy. Does not bruise/bleed easily.  Psychiatric/Behavioral: Negative for behavioral problems (Depression), sleep disturbance and suicidal ideas. The patient is nervous/anxious.     Today's Vitals   04/07/20 1201  BP: (!) 179/83  Pulse: 66  Resp: 16  Temp: (!) 97.2 F (36.2 C)  SpO2: 98%  Weight: 265 lb 9.6 oz (120.5 kg)  Height: _0  (1.803 m)   Body mass index is 37.04 kg/m.   Physical Exam Vitals and nursing note reviewed.  Constitutional:       General: He is not in acute distress.    Appearance:  Normal appearance. He is well-developed. He is obese. He is not diaphoretic.  HENT:     Head: Normocephalic and atraumatic.     Nose: Nose normal.     Mouth/Throat:     Pharynx: No oropharyngeal exudate.  Eyes:     Pupils: Pupils are equal, round, and reactive to light.  Neck:     Thyroid: No thyromegaly.     Vascular: No carotid bruit or JVD.     Trachea: No tracheal deviation.  Cardiovascular:     Rate and Rhythm: Normal rate. Rhythm irregular.     Heart sounds: Normal heart sounds. No murmur heard.  No friction rub. No gallop.   Pulmonary:     Effort: Pulmonary effort is normal. No respiratory distress.     Breath sounds: Normal breath sounds. No wheezing or rales.  Chest:     Chest wall: No tenderness.  Abdominal:     Palpations: Abdomen is soft.  Musculoskeletal:        General: Normal range of motion.     Cervical back: Normal range of motion and neck supple.  Lymphadenopathy:     Cervical: No cervical adenopathy.  Skin:    General: Skin is warm and dry.     Comments: There are several pustular, cyst-like lesions in right axillary region and on right trunk. Some of the lesions are scabbed over. Lesions are red, warm, and very tender to palpate. No drainage currently noted.   Neurological:     Mental Status: He is alert and oriented to person, place, and time. Mental status is at baseline.     Cranial Nerves: No cranial nerve deficit.  Psychiatric:        Attention and Perception: Attention and perception normal.        Mood and Affect: Affect normal. Mood is anxious.        Speech: Speech normal.        Behavior: Behavior normal. Behavior is cooperative.        Thought Content: Thought content normal.        Cognition and Memory: Cognition and memory normal.        Judgment: Judgment normal.    Assessment/Plan: 1. Hidradenitis Continue doxycycline 165m twice daily as originally prescribed. Add clindagel  which may be applied topically to effected areas twice daily. Start medrol dose pack. Take as directed for 6 days. Refer to dermatology for further evaluation and treatment.   - clindamycin (CLINDAGEL) 1 % gel; Apply topically 2 (two) times daily.  Dispense: 60 g; Refill: 2 - methylPREDNISolone (MEDROL) 4 MG TBPK tablet; Take by mouth as directed for 6 days  Dispense: 21 tablet; Refill: 0 - Ambulatory referral to Dermatology  2. Gastroesophageal reflux disease without esophagitis Take omeprazole 46mtwice daily for GERD.  - omeprazole (PRILOSEC) 40 MG capsule; Take 1 capsule (40 mg total) by mouth in the morning and at bedtime.  Dispense: 60 capsule; Refill: 2  3. Type 2 diabetes mellitus with stage 3b chronic kidney disease, with long-term current use of insulin (HCC) Monitor blood sugars closely, especially while on medrol dose pack. Take diabetic medications as prescribed   4. Essential hypertension Generally stable. Patient has not taken bp medications yet today. Advised him to take as prescribed without missing doses.   General Counseling: Jameshilem machucanderstanding of the findings of todays visit and agrees with plan of treatment. I have discussed any further diagnostic evaluation that may be needed or ordered today. We also reviewed  his medications today. he has been encouraged to call the office with any questions or concerns that should arise related to todays visit.  This patient was seen by Leretha Pol FNP Collaboration with Dr Lavera Guise as a part of collaborative care agreement  Orders Placed This Encounter  Procedures  . Ambulatory referral to Dermatology    Meds ordered this encounter  Medications  . clindamycin (CLINDAGEL) 1 % gel    Sig: Apply topically 2 (two) times daily.    Dispense:  60 g    Refill:  2    Order Specific Question:   Supervising Provider    Answer:   Lavera Guise [[id number]]  . methylPREDNISolone (MEDROL) 4 MG TBPK tablet    Sig: Take by  mouth as directed for 6 days    Dispense:  21 tablet    Refill:  0    Order Specific Question:   Supervising Provider    Answer:   Lavera Guise [place]  . omeprazole (PRILOSEC) 40 MG capsule    Sig: Take 1 capsule (40 mg total) by mouth in the morning and at bedtime.    Dispense:  60 capsule    Refill:  2    Order Specific Question:   Supervising Provider    Answer:   Lavera Guise [[id number]]    Total time spent: 30 Minutes   Time spent includes review of chart, medications, test results, and follow up plan with the patient.      Dr Lavera Guise Internal medicine

## 2020-04-09 ENCOUNTER — Telehealth (HOSPITAL_COMMUNITY): Payer: Self-pay

## 2020-04-09 NOTE — Telephone Encounter (Signed)
Attempted to call several times, no answer left messages.   [place] [phone]

## 2020-04-16 ENCOUNTER — Other Ambulatory Visit: Payer: Self-pay

## 2020-04-16 DIAGNOSIS — E876 Hypokalemia: Secondary | ICD-10-CM

## 2020-04-16 MED ORDER — POTASSIUM CHLORIDE CRYS ER 20 MEQ PO TBCR: 20.0000 meq | EXTENDED_RELEASE_TABLET | Freq: Two times a day (BID) | ORAL | 1 refills | Status: DC

## 2020-04-19 DIAGNOSIS — K219 Gastro-esophageal reflux disease without esophagitis: Secondary | ICD-10-CM | POA: Insufficient documentation

## 2020-04-22 ENCOUNTER — Telehealth: Payer: Self-pay

## 2020-04-22 NOTE — Telephone Encounter (Signed)
Completed medical record request and mailed requesting records to [facility] [place], [id number].

## 2020-04-30 ENCOUNTER — Other Ambulatory Visit: Payer: Self-pay

## 2020-04-30 ENCOUNTER — Encounter: Payer: Self-pay | Admitting: Nurse Practitioner

## 2020-04-30 ENCOUNTER — Ambulatory Visit: Payer: Medicare PPO | Admitting: Nurse Practitioner

## 2020-04-30 VITALS — BP 162/80 | HR 71 | Temp 97.8°F | Resp 16 | Ht 71.0 in | Wt 269.2 lb

## 2020-04-30 DIAGNOSIS — Z794 Long term (current) use of insulin: Secondary | ICD-10-CM | POA: Diagnosis not present

## 2020-04-30 DIAGNOSIS — E1142 Type 2 diabetes mellitus with diabetic polyneuropathy: Secondary | ICD-10-CM | POA: Diagnosis not present

## 2020-04-30 DIAGNOSIS — L732 Hidradenitis suppurativa: Secondary | ICD-10-CM

## 2020-04-30 DIAGNOSIS — E1122 Type 2 diabetes mellitus with diabetic chronic kidney disease: Secondary | ICD-10-CM

## 2020-04-30 DIAGNOSIS — I10 Essential (primary) hypertension: Secondary | ICD-10-CM

## 2020-04-30 DIAGNOSIS — N1832 Chronic kidney disease, stage 3b: Secondary | ICD-10-CM | POA: Diagnosis not present

## 2020-04-30 MED ORDER — CLINDAMYCIN HCL 300 MG PO CAPS: 300.0000 mg | ORAL_CAPSULE | Freq: Two times a day (BID) | ORAL | 0 refills | Status: DC

## 2020-04-30 MED ORDER — INSULIN GLARGINE (1 UNIT DIAL) 300 UNIT/ML ~~LOC~~ SOPN: 26.0000 [IU] | PEN_INJECTOR | Freq: Every day | SUBCUTANEOUS | 3 refills | Status: DC

## 2020-04-30 MED ORDER — GABAPENTIN 100 MG PO CAPS: 100.0000 mg | ORAL_CAPSULE | Freq: Three times a day (TID) | ORAL | 1 refills | Status: DC

## 2020-04-30 NOTE — Progress Notes (Signed)
[facility] [place], [place] [address]  Internal MEDICINE  Office Visit Note  Patient Name: Larry Callahan  [id number]  [id number]  Date of Service: 04/30/2020  Chief Complaint  Patient presents with  . Follow-up  . Diabetes  . Gastroesophageal Reflux  . Hyperlipidemia  . Hypertension  . controlled substance form    reviewed with PT  . Arm Pain    sore under right arm  . Medication Refill    The patient is here for follow up visit. He continues to have painful cyst like lesions under the right arm, going down the right chest. Has been on prescription for doxycycline since his most recent visit which was 04/07/2020. He has also been using clindagel, topically. States that lesions continue to get larger and more painful. There is one lesion which has ruptured a few times. This drains some thick yellow fluid then reforms nearly right away. He has been referred to dermatology for further evaluation and treatment. Initial appointment is not until 05/20/2020.  His blood pressure is a bit elevated today. States that it is about time for him to take medicatoin. Usually takes this around 1pm.  On side note, patient states that he has not smoked a cigarette in about 5 months.       Current Medication: Outpatient Encounter Medications as of 04/30/2020  Medication Sig  . acetaminophen (TYLENOL) 325 MG tablet Take 2 tablets (650 mg total) by mouth every 4 (four) hours as needed for headache or mild pain.  Marland Kitchen albuterol (VENTOLIN HFA) 108 (90 Base) MCG/ACT inhaler Inhale 2 puffs into the lungs every 6 (six) hours as needed for wheezing or shortness of breath.  Marland Kitchen amLODipine (NORVASC) 10 MG tablet Take 1 tablet (10 mg total) by mouth daily.  Marland Kitchen ascorbic acid (VITAMIN C) 500 MG tablet Take 1 tablet (500 mg total) by mouth daily.  . benzonatate (TESSALON PERLES) 100 MG capsule Take 1 capsule (100 mg total) by mouth 3 (three) times daily as needed for cough.  .  Blood Glucose Calibration (TRUE METRIX LEVEL 1) Low SOLN Use as directed  . Blood Glucose Monitoring Suppl (TRUE METRIX AIR GLUCOSE METER) DEVI 1 Device by Does not apply route 2 (two) times daily.  . clindamycin (CLINDAGEL) 1 % gel Apply topically 2 (two) times daily.  Marland Kitchen doxycycline (VIBRA-TABS) 100 MG tablet Take 1 tablet (100 mg total) by mouth 2 (two) times daily.  Marland Kitchen escitalopram (LEXAPRO) 10 MG tablet Take one tab po qd for anxiety with food  . ferrous gluconate (FERGON) 324 MG tablet Take 1 tablet (324 mg total) by mouth daily with breakfast.  . furosemide (LASIX) 40 MG tablet Take one to tabs once or twice days for fluid // swelling  . gabapentin (NEURONTIN) 100 MG capsule Take 1 capsule (100 mg total) by mouth in the morning, at noon, and at bedtime.  Marland Kitchen gemfibrozil (LOPID) 600 MG tablet Take 1 tablet (600 mg total) by mouth 2 (two) times daily with a meal.  . glimepiride (AMARYL) 2 MG tablet Take 1 tablet (2 mg total) by mouth at bedtime.  Marland Kitchen glucose blood (TRUE METRIX BLOOD GLUCOSE TEST) test strip Use as instructed twice daily diag E11.65  . hydrALAZINE (APRESOLINE) 100 MG tablet Take 1 tablet (100 mg total) by mouth 3 (three) times daily.  . hydrOXYzine (ATARAX) 10 MG/5ML syrup Take 5 mLs (10 mg total) by mouth 3 (three) times daily as needed for itching.  . insulin glargine, 1  Unit Dial, (TOUJEO) 300 UNIT/ML Solostar Pen Inject 26 Units into the skin daily.  . Insulin Pen Needle 32G X 6 MM MISC Use as directed  . ipratropium-albuterol (DUONEB) 0.5-2.5 (3) MG/3ML SOLN Take 3 mLs by nebulization every 6 (six) hours as needed.  . isosorbide mononitrate (IMDUR) 30 MG 24 hr tablet Take 1 tablet (30 mg total) by mouth daily.  Marland Kitchen linagliptin (TRADJENTA) 5 MG TABS tablet Take 1 tablet (5 mg total) by mouth daily.  Marland Kitchen loratadine (CLARITIN) 10 MG tablet Take 1 tablet (10 mg total) by mouth daily as needed for allergies.  . methylPREDNISolone (MEDROL) 4 MG TBPK tablet Take by mouth as directed for 6  days  . metoprolol succinate (TOPROL-XL) 50 MG 24 hr tablet Take 1 tablet (50 mg total) by mouth at bedtime. Take with or immediately following a meal.  . nitroGLYCERIN (NITROSTAT) 0.4 MG SL tablet Place 1 tablet (0.4 mg total) under the tongue every 5 (five) minutes x 3 doses as needed for chest pain.  Marland Kitchen omeprazole (PRILOSEC) 40 MG capsule Take 1 capsule (40 mg total) by mouth in the morning and at bedtime.  . potassium chloride SA (KLOR-CON) 20 MEQ tablet Take 1 tablet (20 mEq total) by mouth 2 (two) times daily.  . rosuvastatin (CRESTOR) 10 MG tablet Take 1 tablet (10 mg total) by mouth daily.  . TRUEplus Lancets 33G MISC Use as directed twice daily diag E11.65  . vitamin B-12 (CYANOCOBALAMIN) 1000 MCG tablet Take 1 tablet (1,000 mcg total) by mouth daily.  Marland Kitchen zinc sulfate 220 (50 Zn) MG capsule Take 1 capsule (220 mg total) by mouth daily.  Marland Kitchen tiotropium (SPIRIVA HANDIHALER) 18 MCG inhalation capsule Place 1 capsule (18 mcg total) into inhaler and inhale daily.   No facility-administered encounter medications on file as of 04/30/2020.    Surgical History: Past Surgical History:  Procedure Laterality Date  . BACK SURGERY    . CARDIAC CATHETERIZATION    . CHOLECYSTECTOMY    . COLONOSCOPY WITH PROPOFOL N/A 04/12/2019   Procedure: COLONOSCOPY WITH PROPOFOL;  Surgeon: [name], MD;  Location[facility] ENDOSCOPY;  Service: Gastroenterology;  Laterality: N/A;  . ESOPHAGOGASTRODUODENOSCOPY N/A 04/12/2019   Procedure: ESOPHAGOGASTRODUODENOSCOPY (EGD);  Surgeon: [name], MD;  Location: [facility] ENDOSCOPY;  Service: Gastroenterology;  Laterality: N/A;  . ESOPHAGOGASTRODUODENOSCOPY (EGD) WITH PROPOFOL N/A 11/21/2019   Procedure: ESOPHAGOGASTRODUODENOSCOPY (EGD) WITH PROPOFOL;  Surgeon: [name], MD;  Location: [facility] ENDOSCOPY;  Service: Gastroenterology;  Laterality: N/A;  . FLEXIBLE BRONCHOSCOPY Bilateral 05/17/2019   Procedure: FLEXIBLE BRONCHOSCOPY;  Surgeon: [name], MD;  Location: [name] ORS;  Service: Pulmonary;  Laterality: Bilateral;  . left arm surgery    . nephrectomy Left   . PARATHYROIDECTOMY    . RIGHT HEART CATH N/A 03/29/2019   Procedure: RIGHT HEART CATH;  Surgeon: [name], MD;  Location: [facility] CV LAB;  Service: Cardiovascular;  Laterality: N/A;    Medical History: Past Medical History:  Diagnosis Date  . Allergy   . Coronary artery disease   . Diabetes mellitus without complication ([place])   . Diastolic CHF ([place])   . GERD (gastroesophageal reflux disease)   . Hyperlipidemia   . Hypertension   . Renal insufficiency   . Sleep apnea     Family History: Family History  Problem Relation Age of Onset  . Diabetes Mother        2003  . Lung cancer Father   . Diabetes Sister   .  Hypertension Sister   . Heart disease Sister   . Cancer Sister   . Bone cancer Brother     Social History   Socioeconomic History  . Marital status: Single    Spouse name: Not on file  . Number of children: Not on file  . Years of education: Not on file  . Highest education level: Not on file  Occupational History  . Not on file  Tobacco Use  . Smoking status: Former Smoker    Types: Cigarettes  . Smokeless tobacco: Former Systems developer    Types: Secondary school teacher  . Vaping Use: Never used  Substance and Sexual Activity  . Alcohol use: No  . Drug use: No  . Sexual activity: Not on file  Other Topics Concern  . Not on file  Social History Narrative  . Not on file   Social Determinants of Health   Financial Resource Strain:   . Difficulty of Paying Living Expenses: Not on file  Food Insecurity:   . Worried About Charity fundraiser in the Last Year: Not on file  . Ran Out of Food in the Last Year: Not on file  Transportation Needs:   . Lack of Transportation (Medical): Not on file  . Lack of Transportation (Non-Medical): Not on file  Physical Activity:   . Days of Exercise per Week: Not on file  . Minutes of Exercise per  Session: Not on file  Stress:   . Feeling of Stress : Not on file  Social Connections:   . Frequency of Communication with Friends and Family: Not on file  . Frequency of Social Gatherings with Friends and Family: Not on file  . Attends Religious Services: Not on file  . Active Member of Clubs or Organizations: Not on file  . Attends Archivist Meetings: Not on file  . Marital Status: Not on file  Intimate Partner Violence:   . Fear of Current or Ex-Partner: Not on file  . Emotionally Abused: Not on file  . Physically Abused: Not on file  . Sexually Abused: Not on file      Review of Systems  Constitutional: Negative for activity change, chills, fatigue and unexpected weight change.  HENT: Negative for congestion, postnasal drip, rhinorrhea, sneezing and sore throat.   Respiratory: Negative for cough, chest tightness, shortness of breath and wheezing.   Cardiovascular: Negative for chest pain and palpitations.       Blood pressure moderately elevated today.   Gastrointestinal: Negative for abdominal pain, constipation, diarrhea, nausea and vomiting.  Endocrine: Negative for cold intolerance, heat intolerance, polydipsia and polyuria.  Musculoskeletal: Negative for arthralgias, back pain, joint swelling and neck pain.  Skin: Negative for rash.       Cyst-like lesions under the right arm and on right side of upper chest which are very tender. One lesion continues to get large, rupture, drain yellow fluid, then reform. This one is most tender.   Allergic/Immunologic: Negative for environmental allergies.  Neurological: Negative for dizziness, tremors, numbness and headaches.  Hematological: Negative for adenopathy. Does not bruise/bleed easily.  Psychiatric/Behavioral: Negative for behavioral problems (Depression), sleep disturbance and suicidal ideas. The patient is nervous/anxious.     Today's Vitals   04/30/20 1147  BP: (!) 162/80  Pulse: 71  Resp: 16  Temp: 97.8 F  (36.6 C)  SpO2: 96%  Weight: 269 lb 3.2 oz (122.1 kg)  Height: _0  (1.803 m)   Body mass index is 37.55 kg/m.  Physical Exam Vitals and nursing note reviewed.  Constitutional:      General: He is not in acute distress.    Appearance: Normal appearance. He is well-developed. He is obese. He is not diaphoretic.  HENT:     Head: Normocephalic and atraumatic.     Nose: Nose normal.     Mouth/Throat:     Pharynx: No oropharyngeal exudate.  Eyes:     Pupils: Pupils are equal, round, and reactive to light.  Neck:     Thyroid: No thyromegaly.     Vascular: No carotid bruit or JVD.     Trachea: No tracheal deviation.  Cardiovascular:     Rate and Rhythm: Normal rate. Rhythm irregular.     Heart sounds: Normal heart sounds. No murmur heard.  No friction rub. No gallop.   Pulmonary:     Effort: Pulmonary effort is normal. No respiratory distress.     Breath sounds: Normal breath sounds. No wheezing or rales.  Chest:     Chest wall: No tenderness.  Abdominal:     Palpations: Abdomen is soft.  Musculoskeletal:        General: Normal range of motion.     Cervical back: Normal range of motion and neck supple.  Lymphadenopathy:     Cervical: No cervical adenopathy.  Skin:    General: Skin is warm and dry.     Comments: There are several pustular, cyst-like lesions in right mid axillary region and on right trunk. There is one lesion in mid axillary region which is very red and warm. It is very tender to palpate. It measures about 4cm in width and 1.5cm in height.   Neurological:     Mental Status: He is alert and oriented to person, place, and time. Mental status is at baseline.     Cranial Nerves: No cranial nerve deficit.  Psychiatric:        Attention and Perception: Attention and perception normal.        Mood and Affect: Affect normal. Mood is anxious.        Speech: Speech normal.        Behavior: Behavior normal. Behavior is cooperative.        Thought Content: Thought  content normal.        Cognition and Memory: Cognition and memory normal.        Judgment: Judgment normal.    Assessment/Plan:  1. Hidradenitis Worsening. Change antibiotics from doxycycline to clindamycin 36m twice daily for next two weeks. conitnue to apply clindagel to effected areas. Dermatology appointment scheduled for 05/20/2020.  - clindamycin (CLEOCIN) 300 MG capsule; Take 1 capsule (300 mg total) by mouth 2 (two) times daily.  Dispense: 28 capsule; Refill: 0  2. Type 2 diabetes mellitus with stage 3b chronic kidney disease, with long-term current use of insulin ([place] Most recent HgbA1c 6.5 on 01/30/2020. Continue toujeo at current dose. Check HgbA1c at next visit.  - insulin glargine, 1 Unit Dial, (TOUJEO) 300 UNIT/ML Solostar Pen; Inject 26 Units into the skin daily.  Dispense: 6 mL; Refill: 3  3. Diabetic polyneuropathy associated with type 2 diabetes mellitus ([place] May continue gabapentin 1073mup to three times daily as needed. - gabapentin (NEURONTIN) 100 MG capsule; Take 1 capsule (100 mg total) by mouth in the morning, at noon, and at bedtime.  Dispense: 90 capsule; Refill: 1  4. Essential hypertension Elevated today. Continue bp medication as prescribed. Patient to monitor closely. Adjust medication as indicated at next  visit.   General Counseling: Larry Callahan understanding of the findings of todays visit and agrees with plan of treatment. I have discussed any further diagnostic evaluation that may be needed or ordered today. We also reviewed his medications today. he has been encouraged to call the office with any questions or concerns that should arise related to todays visit.   This patient was seen by Leretha Pol FNP Collaboration with Dr Lavera Guise as a part of collaborative care agreement   Total time spent: 30 Minutes   Time spent includes review of chart, medications, test results, and follow up plan with the patient.      Dr Lavera Guise Internal medicine

## 2020-05-04 DIAGNOSIS — G4733 Obstructive sleep apnea (adult) (pediatric): Secondary | ICD-10-CM | POA: Diagnosis not present

## 2020-05-20 ENCOUNTER — Ambulatory Visit (INDEPENDENT_AMBULATORY_CARE_PROVIDER_SITE_OTHER): Payer: Medicare PPO | Admitting: Dermatology

## 2020-05-20 ENCOUNTER — Other Ambulatory Visit: Payer: Self-pay

## 2020-05-20 DIAGNOSIS — L732 Hidradenitis suppurativa: Secondary | ICD-10-CM

## 2020-05-20 MED ORDER — DOXYCYCLINE HYCLATE 100 MG PO TABS: 100.0000 mg | ORAL_TABLET | ORAL | 6 refills | Status: DC

## 2020-05-20 NOTE — Patient Instructions (Addendum)
Hidradenitis Suppurativa is a chronic condition due to abnormal inflamed sweat glands in the body folds (axilla, inframammary, groin, medial thighs), causing recurrent painful cysts and scarring. Goal is control and prevention of flares, not cure. Scars are permanent and can be thickened. Treatment may include daily use of topical and oral antibiotics to decrease inflammation.  Oral isotretinoin may also be helpful, but this generally is not given long term.  For more severe cases, Humira (a biologic injection) may be prescribed to decrease the inflammatory process and prevent flares.  When indicated, inflamed cysts may also be treated surgically with incision and drainage, and/or surgical removal.  Recommend Cln acne wash daily

## 2020-05-20 NOTE — Progress Notes (Signed)
   New Patient Visit  Subjective  Larry Callahan is a 64 y.o. male who presents for the following: Other (Spot under right arm that has been there about 2 years. He was seen at [facility] but was told they could not treat it until he stopped smoking. He stopped smoking 7 months ago.). The patient presents for Upper Body Skin Exam (UBSE) for skin cancer screening and mole check.  Referral from Leretha Pol, NP  The following portions of the chart were reviewed this encounter and updated as appropriate:   Tobacco  Allergies  Meds  Problems  Med Hx  Surg Hx  Fam Hx     Review of Systems:  No other skin or systemic complaints except as noted in HPI or Assessment and Plan.  Objective  Well appearing patient in no apparent distress; mood and affect are within normal limits.  All skin waist up examined.  Objective  Right & L Axillae: Scarring, nodules and erythema of axillary areas, right worse then left. ~3.0 cm nodule of right axilla.  Images     Assessment & Plan  Hidradenitis suppurativa Right & Left Axilla Hidradenitis Suppurativa is a chronic; persistent; non-curable, but treatable condition due to abnormal inflamed sweat glands in the body folds (axilla, inframammary, groin, medial thighs), causing recurrent painful cysts and scarring. Goal is control and prevention of flares, not cure. Scars are permanent and can be thickened. Treatment may include daily use of topical and oral antibiotics to decrease inflammation.  Oral isotretinoin may also be helpful, but this generally is not given long term.  For more severe cases, Humira (a biologic injection) may be prescribed to decrease the inflammatory process and prevent flares.  When indicated, inflamed cysts may also be treated surgically with incision and drainage, and/or surgical removal.  Doxycycline 100 mg 1 po bid x 1 week then decrease to qd with food and plenty of fluid  Recommend Cln acne wash  Recommend excision  of nodule of right axilla. May consider Isotretinoin vs Humira in the future - info given today.  doxycycline (VIBRA-TABS) 100 MG tablet - Right Axilla  Return for Surgery.   I, Ashok Cordia, CMA, am acting as scribe for [name], MD .  Documentation: I have reviewed the above documentation for accuracy and completeness, and I agree with the above.  [name], MD

## 2020-05-22 DIAGNOSIS — I5043 Acute on chronic combined systolic (congestive) and diastolic (congestive) heart failure: Secondary | ICD-10-CM | POA: Diagnosis not present

## 2020-05-26 ENCOUNTER — Encounter: Payer: Self-pay | Admitting: Dermatology

## 2020-05-27 ENCOUNTER — Telehealth (HOSPITAL_COMMUNITY): Payer: Self-pay

## 2020-05-27 NOTE — Telephone Encounter (Signed)
Have attempted to contact several times since his last hospital stay.  No answer, have left messages.  Will continue to try.   [place] [phone]

## 2020-06-01 ENCOUNTER — Ambulatory Visit: Payer: Medicare PPO | Admitting: Nurse Practitioner

## 2020-06-02 ENCOUNTER — Other Ambulatory Visit: Payer: Self-pay

## 2020-06-02 MED ORDER — SPIRIVA HANDIHALER 18 MCG IN CAPS: 18.0000 ug | ORAL_CAPSULE | Freq: Every day | RESPIRATORY_TRACT | 1 refills | Status: DC

## 2020-06-15 ENCOUNTER — Other Ambulatory Visit: Payer: Self-pay

## 2020-06-15 DIAGNOSIS — E1142 Type 2 diabetes mellitus with diabetic polyneuropathy: Secondary | ICD-10-CM

## 2020-06-15 MED ORDER — GABAPENTIN 100 MG PO CAPS: 100.0000 mg | ORAL_CAPSULE | Freq: Three times a day (TID) | ORAL | 1 refills | Status: DC

## 2020-06-22 DIAGNOSIS — I5043 Acute on chronic combined systolic (congestive) and diastolic (congestive) heart failure: Secondary | ICD-10-CM | POA: Diagnosis not present

## 2020-06-23 ENCOUNTER — Other Ambulatory Visit: Payer: Self-pay

## 2020-06-23 DIAGNOSIS — E876 Hypokalemia: Secondary | ICD-10-CM

## 2020-06-23 MED ORDER — POTASSIUM CHLORIDE CRYS ER 20 MEQ PO TBCR: 20.0000 meq | EXTENDED_RELEASE_TABLET | Freq: Two times a day (BID) | ORAL | 1 refills | Status: DC

## 2020-07-03 ENCOUNTER — Other Ambulatory Visit: Payer: Self-pay

## 2020-07-03 DIAGNOSIS — IMO0002 Reserved for concepts with insufficient information to code with codable children: Secondary | ICD-10-CM

## 2020-07-03 MED ORDER — GLIMEPIRIDE 2 MG PO TABS: 2.0000 mg | ORAL_TABLET | Freq: Every day | ORAL | 3 refills | Status: DC

## 2020-07-09 ENCOUNTER — Telehealth: Payer: Self-pay

## 2020-07-09 NOTE — Telephone Encounter (Signed)
Left a message on patient's voicemail to return my call. His sister [place] stated he was unaware of his surgery appointment so I was calling to remind him of the date and time.

## 2020-07-11 ENCOUNTER — Emergency Department: Payer: Medicare PPO

## 2020-07-11 ENCOUNTER — Other Ambulatory Visit: Payer: Self-pay

## 2020-07-11 ENCOUNTER — Encounter: Payer: Self-pay | Admitting: Emergency Medicine

## 2020-07-11 ENCOUNTER — Emergency Department
Admission: EM | Admit: 2020-07-11 | Discharge: 2020-07-11 | Disposition: A | Discharge status: Home / Self Care | Payer: Medicare PPO | Attending: Emergency Medicine | Admitting: Emergency Medicine

## 2020-07-11 DIAGNOSIS — R0789 Other chest pain: Secondary | ICD-10-CM | POA: Diagnosis not present

## 2020-07-11 DIAGNOSIS — Z951 Presence of aortocoronary bypass graft: Secondary | ICD-10-CM | POA: Insufficient documentation

## 2020-07-11 DIAGNOSIS — I5033 Acute on chronic diastolic (congestive) heart failure: Secondary | ICD-10-CM | POA: Diagnosis not present

## 2020-07-11 DIAGNOSIS — Z85528 Personal history of other malignant neoplasm of kidney: Secondary | ICD-10-CM | POA: Insufficient documentation

## 2020-07-11 DIAGNOSIS — Z7984 Long term (current) use of oral hypoglycemic drugs: Secondary | ICD-10-CM | POA: Insufficient documentation

## 2020-07-11 DIAGNOSIS — Z8616 Personal history of COVID-19: Secondary | ICD-10-CM | POA: Insufficient documentation

## 2020-07-11 DIAGNOSIS — I130 Hypertensive heart and chronic kidney disease with heart failure and stage 1 through stage 4 chronic kidney disease, or unspecified chronic kidney disease: Secondary | ICD-10-CM | POA: Diagnosis not present

## 2020-07-11 DIAGNOSIS — I2510 Atherosclerotic heart disease of native coronary artery without angina pectoris: Secondary | ICD-10-CM | POA: Diagnosis not present

## 2020-07-11 DIAGNOSIS — N1832 Chronic kidney disease, stage 3b: Secondary | ICD-10-CM | POA: Insufficient documentation

## 2020-07-11 DIAGNOSIS — E1122 Type 2 diabetes mellitus with diabetic chronic kidney disease: Secondary | ICD-10-CM | POA: Insufficient documentation

## 2020-07-11 DIAGNOSIS — Z87891 Personal history of nicotine dependence: Secondary | ICD-10-CM | POA: Diagnosis not present

## 2020-07-11 DIAGNOSIS — Z794 Long term (current) use of insulin: Secondary | ICD-10-CM | POA: Diagnosis not present

## 2020-07-11 DIAGNOSIS — E1165 Type 2 diabetes mellitus with hyperglycemia: Secondary | ICD-10-CM | POA: Insufficient documentation

## 2020-07-11 DIAGNOSIS — L0390 Cellulitis, unspecified: Secondary | ICD-10-CM

## 2020-07-11 DIAGNOSIS — R0902 Hypoxemia: Secondary | ICD-10-CM | POA: Diagnosis not present

## 2020-07-11 DIAGNOSIS — R1110 Vomiting, unspecified: Secondary | ICD-10-CM | POA: Diagnosis not present

## 2020-07-11 DIAGNOSIS — Z85038 Personal history of other malignant neoplasm of large intestine: Secondary | ICD-10-CM | POA: Diagnosis not present

## 2020-07-11 DIAGNOSIS — Z20822 Contact with and (suspected) exposure to covid-19: Secondary | ICD-10-CM | POA: Diagnosis not present

## 2020-07-11 DIAGNOSIS — J449 Chronic obstructive pulmonary disease, unspecified: Secondary | ICD-10-CM | POA: Diagnosis not present

## 2020-07-11 DIAGNOSIS — R079 Chest pain, unspecified: Secondary | ICD-10-CM | POA: Insufficient documentation

## 2020-07-11 DIAGNOSIS — E1142 Type 2 diabetes mellitus with diabetic polyneuropathy: Secondary | ICD-10-CM | POA: Insufficient documentation

## 2020-07-11 DIAGNOSIS — I10 Essential (primary) hypertension: Secondary | ICD-10-CM | POA: Diagnosis not present

## 2020-07-11 DIAGNOSIS — Z79899 Other long term (current) drug therapy: Secondary | ICD-10-CM | POA: Insufficient documentation

## 2020-07-11 DIAGNOSIS — J189 Pneumonia, unspecified organism: Secondary | ICD-10-CM

## 2020-07-11 LAB — TROPONIN I (HIGH SENSITIVITY)
Troponin I (High Sensitivity): 14 ng/L (ref <18)
Troponin I (High Sensitivity): 15 ng/L (ref <18)

## 2020-07-11 LAB — COMPREHENSIVE METABOLIC PANEL
ALT: 17 U/L (ref 0–44)
AST: 22 U/L (ref 15–41)
Albumin: 3.6 g/dL (ref 3.5–5.0)
Alkaline Phosphatase: 99 U/L (ref 38–126)
Anion gap: 11 (ref 5–15)
BUN: 48 mg/dL — ABNORMAL HIGH (ref 8–23)
CO2: 20 mmol/L — ABNORMAL LOW (ref 22–32)
Calcium: 9.2 mg/dL (ref 8.9–10.3)
Chloride: 107 mmol/L (ref 98–111)
Creatinine, Ser: 2.1 mg/dL — ABNORMAL HIGH (ref 0.61–1.24)
GFR, Estimated: 34 mL/min — ABNORMAL LOW (ref >60)
Glucose, Bld: 235 mg/dL — ABNORMAL HIGH (ref 70–99)
Potassium: 4.8 mmol/L (ref 3.5–5.1)
Sodium: 138 mmol/L (ref 135–145)
Total Bilirubin: 0.6 mg/dL (ref 0.3–1.2)
Total Protein: 7.7 g/dL (ref 6.5–8.1)

## 2020-07-11 LAB — CBC
HCT: 36.7 % — ABNORMAL LOW (ref 39.0–52.0)
Hemoglobin: 11.8 g/dL — ABNORMAL LOW (ref 13.0–17.0)
MCH: 29.3 pg (ref 26.0–34.0)
MCHC: 32.2 g/dL (ref 30.0–36.0)
MCV: 91.1 fL (ref 80.0–100.0)
Platelets: 205 10*3/uL (ref 150–400)
RBC: 4.03 MIL/uL — ABNORMAL LOW (ref 4.22–5.81)
RDW: 13.1 % (ref 11.5–15.5)
WBC: 8.1 10*3/uL (ref 4.0–10.5)
nRBC: 0.2 % (ref 0.0–0.2)

## 2020-07-11 LAB — SARS CORONAVIRUS 2 BY RT PCR (HOSPITAL ORDER, PERFORMED IN ~~LOC~~ HOSPITAL LAB): SARS Coronavirus 2: NEGATIVE

## 2020-07-11 MED ORDER — BACITRACIN-NEOMYCIN-POLYMYXIN 400-5-5000 EX OINT
TOPICAL_OINTMENT | CUTANEOUS | Status: AC
  Administered 2020-07-11: 1
  Filled 2020-07-11: qty 1

## 2020-07-11 MED ORDER — BACITRACIN 500 UNIT/GM EX OINT: 1.0000 "application " | TOPICAL_OINTMENT | Freq: Two times a day (BID) | CUTANEOUS | 0 refills | Status: DC

## 2020-07-11 MED ORDER — CEPHALEXIN 500 MG PO CAPS: 500.0000 mg | ORAL_CAPSULE | Freq: Four times a day (QID) | ORAL | 0 refills | Status: DC

## 2020-07-11 MED ORDER — BACITRACIN-NEOMYCIN-POLYMYXIN 400-5-5000 EX OINT
TOPICAL_OINTMENT | CUTANEOUS | Status: AC
  Filled 2020-07-11: qty 2

## 2020-07-11 MED ORDER — AZITHROMYCIN 250 MG PO TABS: ORAL_TABLET | ORAL | 0 refills | Status: DC

## 2020-07-11 MED ORDER — CEPHALEXIN 500 MG PO CAPS
500.0000 mg | ORAL_CAPSULE | Freq: Once | ORAL | Status: AC
  Administered 2020-07-11: 500 mg via ORAL
  Filled 2020-07-11: qty 1

## 2020-07-11 MED ORDER — OXYCODONE-ACETAMINOPHEN 5-325 MG PO TABS
1.0000 | ORAL_TABLET | Freq: Once | ORAL | Status: AC
  Administered 2020-07-11: 1 via ORAL
  Filled 2020-07-11: qty 1

## 2020-07-11 NOTE — ED Triage Notes (Signed)
Pt presents to the ED via EMS from home. Pt c/o centralized CP for approx 1 hour prior to arrival. Pt described pain as sharp, stabbing pain. Denies SOB but states that he did feel nauseous. Pt is A&Ox4 and NAD.

## 2020-07-11 NOTE — ED Provider Notes (Signed)
[facility] Emergency Department Provider Note  Time seen: 2:15 PM  I have reviewed the triage vital signs and the nursing notes.   HISTORY  Chief Complaint Chest pain  HPI Larry Callahan is a 65 y.o. male with a past medical history of CAD, diabetes, CHF, gastric reflux, hypertension, hyperlipidemia presents to the emergency department for chest pain.  According to the patient since this morning he has been experiencing mild central chest pain.  Denies any shortness of breath or cough but does state one episode of vomiting this morning as well.  As a secondary complaint patient states he was attacked by his cat yesterday and has scratches to his right arm but states he is already seen his home health care provider for this and they have called him in an antibiotic which he will go pick up today.  Denies any fever.  Patient states he received nitroglycerin by EMS with no change in his chest pain.   Past Medical History:  Diagnosis Date  . Allergy   . Coronary artery disease   . Diabetes mellitus without complication ([place])   . Diastolic CHF ([place])   . GERD (gastroesophageal reflux disease)   . Hyperlipidemia   . Hypertension   . Renal insufficiency   . Sleep apnea     Patient Active Problem List   Diagnosis Date Noted  . Gastroesophageal reflux disease without esophagitis 04/19/2020  . Pneumonia due to COVID-19 virus 01/30/2020  . Elevated troponin 01/29/2020  . Suspected COVID-19 virus infection 01/29/2020  . Postural dizziness with presyncope 01/29/2020  . Hematemesis 01/09/2020  . Hospital discharge follow-up 11/26/2019  . Cellulitis of left hand 09/26/2019  . Generalized weakness 08/08/2019  . BMI 35.0-35.9,adult 08/08/2019  . Acute right hip pain 06/17/2019  . Inability to ambulate due to hip 06/17/2019  . CKD stage 3 due to type 2 diabetes mellitus ([place])   . Hypervolemia   . Acute on chronic diastolic CHF (congestive heart failure) ([place])    . Type 2 diabetes mellitus with stage 3b chronic kidney disease, with long-term current use of insulin ([place])   . Hypomagnesemia   . Full code status 05/20/2019  . Pleuritic chest pain   . Multifocal pneumonia 05/19/2019  . Acute kidney injury superimposed on CKD 3b ([place]) 05/09/2019  . Hemoptysis 04/09/2019  . Shortness of breath 03/26/2019  . Uncontrolled type 2 diabetes mellitus with insulin therapy ([place]) 03/12/2019  . Acute on chronic heart failure with preserved ejection fraction (HFpEF) ([place])   . Chronic kidney disease, stage 3b ([place])   . Chronic systolic CHF (congestive heart failure) ([place]) 03/01/2019  . Diastolic dysfunction 69/48/5462  . Iron deficiency anemia 12/31/2018  . Atopic dermatitis 11/24/2018  . Screening for colon cancer 11/06/2017  . Uncontrolled type 2 diabetes mellitus with hyperglycemia ([place]) 11/06/2017  . Hidradenitis 06/09/2017  . Atherosclerotic heart disease of native coronary artery without angina pectoris 06/09/2017  . 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 Obstructive sleep apnea, adult 06/09/2017  . Morbid (severe) obesity due to excess calories ([place]) 06/09/2017  . Cigarette nicotine dependence with nicotine-induced disorder 06/09/2017  . Diabetic polyneuropathy associated with type 2 diabetes mellitus ([place]) 06/09/2017  . Allergic rhinitis due to pollen 06/09/2017  . Mixed hyperlipidemia 06/09/2017  . COPD (chronic obstructive pulmonary disease) ([place]) 06/09/2017  . Dyspnea on exertion 06/09/2017  . Wheezing 06/09/2017  . Dysuria 06/09/2017  . Snoring 06/09/2017  . Essential hypertension 06/09/2017  . Personal  history of other malignant neoplasm of kidney 06/09/2017  . Pain in right hip 06/09/2017  . Impacted cerumen, bilateral 06/09/2017  . Secondary hyperparathyroidism, not elsewhere classified ([place]) 06/09/2017  . Type 2 diabetes mellitus with hyperglycemia ([place]) 06/09/2017  . Tinea corporis 06/09/2017    Past  Surgical History:  Procedure Laterality Date  . BACK SURGERY    . CARDIAC CATHETERIZATION    . CHOLECYSTECTOMY    . COLONOSCOPY WITH PROPOFOL N/A 04/12/2019   Procedure: COLONOSCOPY WITH PROPOFOL;  Surgeon: [name], MD;  Location: [facility] ENDOSCOPY;  Service: Gastroenterology;  Laterality: N/A;  . ESOPHAGOGASTRODUODENOSCOPY N/A 04/12/2019   Procedure: ESOPHAGOGASTRODUODENOSCOPY (EGD);  Surgeon: [name], MD;  Location: [facility] ENDOSCOPY;  Service: Gastroenterology;  Laterality: N/A;  . ESOPHAGOGASTRODUODENOSCOPY (EGD) WITH PROPOFOL N/A 11/21/2019   Procedure: ESOPHAGOGASTRODUODENOSCOPY (EGD) WITH PROPOFOL;  Surgeon: [name], MD;  Location: [facility] ENDOSCOPY;  Service: Gastroenterology;  Laterality: N/A;  . FLEXIBLE BRONCHOSCOPY Bilateral 05/17/2019   Procedure: FLEXIBLE BRONCHOSCOPY;  Surgeon: [name], MD;  Location: [name] ORS;  Service: Pulmonary;  Laterality: Bilateral;  . left arm surgery    . nephrectomy Left   . PARATHYROIDECTOMY    . RIGHT HEART CATH N/A 03/29/2019   Procedure: RIGHT HEART CATH;  Surgeon: [name], MD;  Location: [place] CV LAB;  Service: Cardiovascular;  Laterality: N/A;    Prior to Admission medications   Medication Sig Start Date End Date Taking? Authorizing Provider  acetaminophen (TYLENOL) 325 MG tablet Take 2 tablets (650 mg total) by mouth every 4 (four) hours as needed for headache or mild pain. 03/05/19   Gouru, Illene Silver, MD  albuterol (VENTOLIN HFA) 108 (90 Base) MCG/ACT inhaler Inhale 2 puffs into the lungs every 6 (six) hours as needed for wheezing or shortness of breath. 02/20/20   [name], NP  amLODipine (NORVASC) 10 MG tablet Take 1 tablet (10 mg total) by mouth daily. 08/01/19   [name], NP  ascorbic acid (VITAMIN C) 500 MG tablet Take 1 tablet (500 mg total) by mouth daily. 02/04/20   Oswald Hillock, MD  benzonatate (TESSALON PERLES) 100 MG capsule Take 1 capsule (100 mg total) by mouth 3 (three)  times daily as needed for cough. 12/10/19 12/09/20  Carrie Mew, MD  Blood Glucose Calibration (TRUE METRIX LEVEL 1) Low SOLN Use as directed 04/04/18   [name], NP  Blood Glucose Monitoring Suppl (TRUE METRIX AIR GLUCOSE METER) DEVI 1 Device by Does not apply route 2 (two) times daily. 04/13/18   [name], NP  clindamycin (CLINDAGEL) 1 % gel Apply topically 2 (two) times daily. 04/07/20   [name], NP  dexamethasone (DECADRON) 2 MG tablet Take 2 mg by mouth 3 (three) times daily. 02/03/20   [provider]  doxycycline (VIBRA-TABS) 100 MG tablet Take 1 tablet (100 mg total) by mouth as directed. Take 1 po bid x 1 week then decrease to 1 po qd with food and plenty of fluid. 05/20/20 09/17/20  Ralene Bathe, MD  escitalopram (LEXAPRO) 10 MG tablet Take one tab po qd for anxiety with food 03/18/20   Lavera Guise, MD  ferrous gluconate (FERGON) 324 MG tablet Take 1 tablet (324 mg total) by mouth daily with breakfast. 08/01/19   [name], NP  furosemide (LASIX) 40 MG tablet Take one to tabs once or twice days for fluid // swelling 03/25/20   Lavera Guise, MD  gabapentin (NEURONTIN) 100  MG capsule Take 1 capsule (100 mg total) by mouth in the morning, at noon, and at bedtime. 06/15/20   [name], NP  gemfibrozil (LOPID) 600 MG tablet Take 1 tablet (600 mg total) by mouth 2 (two) times daily with a meal. 01/17/20   Boscia, Heather E, NP  glimepiride (AMARYL) 2 MG tablet Take 1 tablet (2 mg total) by mouth at bedtime. 07/03/20   Lavera Guise, MD  glucose blood (TRUE METRIX BLOOD GLUCOSE TEST) test strip Use as instructed twice daily diag E11.65 07/30/19   [name], NP  hydrALAZINE (APRESOLINE) 100 MG tablet Take 1 tablet (100 mg total) by mouth 3 (three) times daily. 01/06/20   [name], NP  hydrOXYzine (ATARAX) 10 MG/5ML syrup Take 5 mLs (10 mg total) by mouth 3 (three) times daily as needed for itching. 09/24/19   Sable Feil, PA-C   insulin glargine, 1 Unit Dial, (TOUJEO) 300 UNIT/ML Solostar Pen Inject 26 Units into the skin daily. 04/30/20   [name], NP  Insulin Pen Needle 32G X 6 MM MISC Use as directed 03/25/20   [name], NP  ipratropium-albuterol (DUONEB) 0.5-2.5 (3) MG/3ML SOLN Take 3 mLs by nebulization every 6 (six) hours as needed. 03/08/19   Kendell Bane, NP  isosorbide mononitrate (IMDUR) 30 MG 24 hr tablet Take 1 tablet (30 mg total) by mouth daily. 03/11/20   [name], MD  linagliptin (TRADJENTA) 5 MG TABS tablet Take 1 tablet (5 mg total) by mouth daily. 02/04/20   Oswald Hillock, MD  loratadine (CLARITIN) 10 MG tablet Take 1 tablet (10 mg total) by mouth daily as needed for allergies. 11/21/19   Enzo Bi, MD  methylPREDNISolone (MEDROL) 4 MG TBPK tablet Take by mouth as directed for 6 days 04/07/20   [name], NP  metoprolol succinate (TOPROL-XL) 50 MG 24 hr tablet Take 1 tablet (50 mg total) by mouth at bedtime. Take with or immediately following a meal. 03/18/20   Boscia, Greer Ee, NP  nitroGLYCERIN (NITROSTAT) 0.4 MG SL tablet Place 1 tablet (0.4 mg total) under the tongue every 5 (five) minutes x 3 doses as needed for chest pain. 05/24/19   Loletha Grayer, MD  omeprazole (PRILOSEC) 40 MG capsule Take 1 capsule (40 mg total) by mouth in the morning and at bedtime. 04/07/20   [name], NP  potassium chloride SA (KLOR-CON) 20 MEQ tablet Take 1 tablet (20 mEq total) by mouth 2 (two) times daily. 06/23/20   Luiz Ochoa, NP  rosuvastatin (CRESTOR) 10 MG tablet Take 1 tablet (10 mg total) by mouth daily. 03/11/20 06/09/20  [name], MD  tiotropium (SPIRIVA HANDIHALER) 18 MCG inhalation capsule Place 1 capsule (18 mcg total) into inhaler and inhale daily. 06/02/20 08/31/20  [name], NP  TRUEplus Lancets 33G MISC Use as directed twice daily diag E11.65 07/30/19   [name], NP  vitamin B-12 (CYANOCOBALAMIN) 1000 MCG tablet Take 1 tablet (1,000  mcg total) by mouth daily. 11/21/19   Enzo Bi, MD  zinc sulfate 220 (50 Zn) MG capsule Take 1 capsule (220 mg total) by mouth daily. 02/04/20   Oswald Hillock, MD    Allergies  Allergen Reactions  . Penicillins Rash    Family History  Problem Relation Age of Onset  . Diabetes Mother        2003  . Lung cancer Father   . Diabetes Sister   .  Hypertension Sister   . Heart disease Sister   . Cancer Sister   . Bone cancer Brother     Social History Social History   Tobacco Use  . Smoking status: Former Smoker    Types: Cigarettes  . Smokeless tobacco: Former Systems developer    Types: Secondary school teacher  . Vaping Use: Never used  Substance Use Topics  . Alcohol use: No  . Drug use: No    Review of Systems Constitutional: Negative for fever. Cardiovascular: Positive for chest pain starting early this morning. Respiratory: Negative for shortness of breath. Gastrointestinal: Negative for abdominal pain.  1 episode of vomiting today. Genitourinary: Negative for urinary compaints Musculoskeletal: Scratches to right forearm from his cat yesterday. Neurological: Negative for headache All other ROS negative  ____________________________________________   PHYSICAL EXAM:  Constitutional: Alert and oriented. Well appearing and in no distress. Eyes: Normal exam ENT      Head: Normocephalic and atraumatic.      Mouth/Throat: Mucous membranes are moist. Cardiovascular: Normal rate, regular rhythm. No murmur Respiratory: Normal respiratory effort without tachypnea nor retractions. Breath sounds are clear  Gastrointestinal: Soft and nontender. No distention.  Musculoskeletal: Nontender with normal range of motion in all extremities.  Neurologic:  Normal speech and language. No gross focal neurologic deficits  Skin:  Skin is warm, dry and intact.  Psychiatric: Mood and affect are normal.   ____________________________________________    EKG  EKG viewed and interpreted by myself shows  a normal sinus rhythm at 78 bpm with a narrow QRS, normal axis, normal intervals, no concerning ST changes.  ____________________________________________    RADIOLOGY  Chest x-ray shows possible multifocal infection  ____________________________________________   INITIAL IMPRESSION / ASSESSMENT AND PLAN / ED COURSE  Pertinent labs & imaging results that were available during my care of the patient were reviewed by me and considered in my medical decision making (see chart for details).   Patient presents to the emergency department for chest pain beginning early this morning.  Overall the patient appears well, no distress.  Patient states 1 episode of vomiting.  Will check labs including cardiac enzymes x2, will obtain a chest x-ray as well as a Covid swab.  Differential would include pneumonia, ACS, chest wall pain, Covid, reflux.  Patient's initial troponin is negative.  Lab work largely at baseline.  Chest x-ray however shows possible multifocal infection.  Given these findings and the patient's unvaccinated status we will obtain a Covid swab in the emergency department.  Repeat troponin is pending as well.  Overall patient appears well and anticipate likely discharge home if repeat troponin is negative.  Patient care signed out to oncoming provider.  Larry Callahan was evaluated in Emergency Department on 07/11/2020 for the symptoms described in the history of present illness. He was evaluated in the context of the global COVID-19 pandemic, which necessitated consideration that the patient might be at risk for infection with the SARS-CoV-2 virus that causes COVID-19. Institutional protocols and algorithms that pertain to the evaluation of patients at risk for COVID-19 are in a state of rapid change based on information released by regulatory bodies including the CDC and federal and state organizations. These policies and algorithms were followed during the patient's care in the  ED.  ____________________________________________   FINAL CLINICAL IMPRESSION(S) / ED DIAGNOSES  Chest pain   Harvest Dark, MD 07/11/20 1516

## 2020-07-11 NOTE — ED Notes (Signed)
X-Ray at bedside.

## 2020-07-11 NOTE — ED Provider Notes (Signed)
Second troponin negative. COVID negative. At this time will plan on treating for pneumonia. Patient also has some lacerations to his right arm with some surrounding erythema. Will treat with antibiotics. Discussed findings and plan with patient.    Nance Pear, MD 07/11/20 [phone]

## 2020-07-11 NOTE — Discharge Instructions (Signed)
Please seek medical attention for any high fevers, chest pain, shortness of breath, change in behavior, persistent vomiting, bloody stool or any other new or concerning symptoms.  

## 2020-07-11 NOTE — ED Notes (Signed)
Pt given TV remote 

## 2020-07-13 ENCOUNTER — Ambulatory Visit: Payer: Medicare PPO | Admitting: Hospice and Palliative Medicine

## 2020-07-17 ENCOUNTER — Encounter: Payer: Self-pay | Admitting: Physician Assistant

## 2020-07-17 ENCOUNTER — Ambulatory Visit (INDEPENDENT_AMBULATORY_CARE_PROVIDER_SITE_OTHER): Payer: Medicare PPO | Admitting: Internal Medicine

## 2020-07-17 ENCOUNTER — Other Ambulatory Visit: Payer: Self-pay

## 2020-07-17 VITALS — BP 154/92 | HR 93 | Temp 97.7°F | Resp 16 | Ht 71.0 in | Wt 273.2 lb

## 2020-07-17 DIAGNOSIS — E876 Hypokalemia: Secondary | ICD-10-CM

## 2020-07-17 DIAGNOSIS — J440 Chronic obstructive pulmonary disease with acute lower respiratory infection: Secondary | ICD-10-CM

## 2020-07-17 DIAGNOSIS — J209 Acute bronchitis, unspecified: Secondary | ICD-10-CM

## 2020-07-17 DIAGNOSIS — E1140 Type 2 diabetes mellitus with diabetic neuropathy, unspecified: Secondary | ICD-10-CM

## 2020-07-17 DIAGNOSIS — I10 Essential (primary) hypertension: Secondary | ICD-10-CM

## 2020-07-17 DIAGNOSIS — F411 Generalized anxiety disorder: Secondary | ICD-10-CM

## 2020-07-17 DIAGNOSIS — E1122 Type 2 diabetes mellitus with diabetic chronic kidney disease: Secondary | ICD-10-CM | POA: Diagnosis not present

## 2020-07-17 DIAGNOSIS — K219 Gastro-esophageal reflux disease without esophagitis: Secondary | ICD-10-CM

## 2020-07-17 DIAGNOSIS — E1165 Type 2 diabetes mellitus with hyperglycemia: Secondary | ICD-10-CM

## 2020-07-17 DIAGNOSIS — E1142 Type 2 diabetes mellitus with diabetic polyneuropathy: Secondary | ICD-10-CM

## 2020-07-17 DIAGNOSIS — N1832 Chronic kidney disease, stage 3b: Secondary | ICD-10-CM | POA: Diagnosis not present

## 2020-07-17 DIAGNOSIS — I5032 Chronic diastolic (congestive) heart failure: Secondary | ICD-10-CM | POA: Diagnosis not present

## 2020-07-17 DIAGNOSIS — Z794 Long term (current) use of insulin: Secondary | ICD-10-CM | POA: Diagnosis not present

## 2020-07-17 LAB — POCT GLYCOSYLATED HEMOGLOBIN (HGB A1C): Hemoglobin A1C: 7.1 % — AB (ref 4.0–5.6)

## 2020-07-17 MED ORDER — POTASSIUM CHLORIDE CRYS ER 20 MEQ PO TBCR: 20.0000 meq | EXTENDED_RELEASE_TABLET | Freq: Two times a day (BID) | ORAL | 1 refills | Status: DC

## 2020-07-17 MED ORDER — DULERA 200-5 MCG/ACT IN AERO: 2.0000 | INHALATION_SPRAY | Freq: Two times a day (BID) | RESPIRATORY_TRACT | 3 refills | Status: DC

## 2020-07-17 MED ORDER — ROSUVASTATIN CALCIUM 10 MG PO TABS: 10.0000 mg | ORAL_TABLET | Freq: Every day | ORAL | 1 refills | Status: DC

## 2020-07-17 MED ORDER — ISOSORBIDE MONONITRATE ER 30 MG PO TB24: 30.0000 mg | ORAL_TABLET | Freq: Every day | ORAL | 1 refills | Status: DC

## 2020-07-17 MED ORDER — METOPROLOL SUCCINATE ER 50 MG PO TB24: 50.0000 mg | ORAL_TABLET | Freq: Every day | ORAL | 1 refills | Status: DC

## 2020-07-17 MED ORDER — FUROSEMIDE 40 MG PO TABS: ORAL_TABLET | ORAL | 1 refills | Status: DC

## 2020-07-17 MED ORDER — ALBUTEROL SULFATE HFA 108 (90 BASE) MCG/ACT IN AERS: 2.0000 | INHALATION_SPRAY | Freq: Four times a day (QID) | RESPIRATORY_TRACT | 0 refills | Status: DC | PRN

## 2020-07-17 MED ORDER — HYDRALAZINE HCL 100 MG PO TABS: 100.0000 mg | ORAL_TABLET | Freq: Three times a day (TID) | ORAL | 1 refills | Status: DC

## 2020-07-17 MED ORDER — GLIMEPIRIDE 2 MG PO TABS
ORAL_TABLET | ORAL | 1 refills | Status: DC
Start: 2020-07-17 — End: 2021-02-23

## 2020-07-17 MED ORDER — GABAPENTIN 100 MG PO CAPS
100.0000 mg | ORAL_CAPSULE | Freq: Three times a day (TID) | ORAL | 1 refills | Status: DC
Start: 2020-07-17 — End: 2021-02-23

## 2020-07-17 MED ORDER — OMEPRAZOLE 40 MG PO CPDR: 40.0000 mg | DELAYED_RELEASE_CAPSULE | Freq: Two times a day (BID) | ORAL | 1 refills | Status: DC

## 2020-07-17 MED ORDER — ESCITALOPRAM OXALATE 10 MG PO TABS: ORAL_TABLET | ORAL | 3 refills | Status: DC

## 2020-07-17 NOTE — Progress Notes (Signed)
[facility] [place], [place] [address]  Internal MEDICINE  Office Visit Note  Patient Name: Larry Callahan  [id number]  [id number]  Date of Service: 07/24/2020  Chief Complaint  Patient presents with  . Follow-up    Refill request, went to ER last Saturday, pt had pneumonia, feels better now, med review  . Diabetes  . Gastroesophageal Reflux  . Hyperlipidemia  . Sleep Apnea  . Hypertension  . Quality Metric Gaps    Pna, colonoscopy, covid vaccine     HPI  Pt is here for routine follow up He continues to have chest pain, was evaluated in ED and sent home. He does have anxiety. Needs refills on his meds. Blood sugars are under better control.  He gets worried about his breathing at times, sometimes Duoneb does not resolve this  Current Medication: Outpatient Encounter Medications as of 07/17/2020  Medication Sig Note  . [DISCONTINUED] mometasone-formoterol (DULERA) 200-5 MCG/ACT AERO Inhale 2 puffs into the lungs 2 (two) times daily. 07/20/2020: medication is not covered on insurance  . acetaminophen (TYLENOL) 325 MG tablet Take 2 tablets (650 mg total) by mouth every 4 (four) hours as needed for headache or mild pain.   Marland Kitchen albuterol (VENTOLIN HFA) 108 (90 Base) MCG/ACT inhaler Inhale 2 puffs into the lungs every 6 (six) hours as needed for wheezing or shortness of breath.   Marland Kitchen ascorbic acid (VITAMIN C) 500 MG tablet Take 1 tablet (500 mg total) by mouth daily.   . Blood Glucose Calibration (TRUE METRIX LEVEL 1) Low SOLN Use as directed   . Blood Glucose Monitoring Suppl (TRUE METRIX AIR GLUCOSE METER) DEVI 1 Device by Does not apply route 2 (two) times daily.   Marland Kitchen escitalopram (LEXAPRO) 10 MG tablet Take one tab po qd for anxiety with food   . ferrous gluconate (FERGON) 324 MG tablet Take 1 tablet (324 mg total) by mouth daily with breakfast.   . furosemide (LASIX) 40 MG tablet Take one to tabs once or twice days for fluid // swelling   . gabapentin  (NEURONTIN) 100 MG capsule Take 1 capsule (100 mg total) by mouth in the morning, at noon, and at bedtime.   Marland Kitchen glimepiride (AMARYL) 2 MG tablet Take one tab po qd with supper   . glucose blood (TRUE METRIX BLOOD GLUCOSE TEST) test strip Use as instructed twice daily diag E11.65   . hydrALAZINE (APRESOLINE) 100 MG tablet Take 1 tablet (100 mg total) by mouth 3 (three) times daily.   . insulin glargine, 1 Unit Dial, (TOUJEO) 300 UNIT/ML Solostar Pen Inject 26 Units into the skin daily.   . Insulin Pen Needle 32G X 6 MM MISC Use as directed   . ipratropium-albuterol (DUONEB) 0.5-2.5 (3) MG/3ML SOLN Take 3 mLs by nebulization every 6 (six) hours as needed.   . isosorbide mononitrate (IMDUR) 30 MG 24 hr tablet Take 1 tablet (30 mg total) by mouth daily.   . metoprolol succinate (TOPROL-XL) 50 MG 24 hr tablet Take 1 tablet (50 mg total) by mouth at bedtime. Take with or immediately following a meal.   . nitroGLYCERIN (NITROSTAT) 0.4 MG SL tablet Place 1 tablet (0.4 mg total) under the tongue every 5 (five) minutes x 3 doses as needed for chest pain.   Marland Kitchen omeprazole (PRILOSEC) 40 MG capsule Take 1 capsule (40 mg total) by mouth in the morning and at bedtime.   . potassium chloride SA (KLOR-CON) 20 MEQ tablet Take 1 tablet (20  mEq total) by mouth 2 (two) times daily.   . rosuvastatin (CRESTOR) 10 MG tablet Take 1 tablet (10 mg total) by mouth daily.   Marland Kitchen tiotropium (SPIRIVA HANDIHALER) 18 MCG inhalation capsule Place 1 capsule (18 mcg total) into inhaler and inhale daily.   . TRUEplus Lancets 33G MISC Use as directed twice daily diag E11.65   . vitamin B-12 (CYANOCOBALAMIN) 1000 MCG tablet Take 1 tablet (1,000 mcg total) by mouth daily.   Marland Kitchen zinc sulfate 220 (50 Zn) MG capsule Take 1 capsule (220 mg total) by mouth daily.   . [DISCONTINUED] albuterol (VENTOLIN HFA) 108 (90 Base) MCG/ACT inhaler Inhale 2 puffs into the lungs every 6 (six) hours as needed for wheezing or shortness of breath.   . [DISCONTINUED]  amLODipine (NORVASC) 10 MG tablet Take 1 tablet (10 mg total) by mouth daily.   . [DISCONTINUED] bacitracin 500 UNIT/GM ointment Apply 1 application topically 2 (two) times daily.   . [DISCONTINUED] benzonatate (TESSALON PERLES) 100 MG capsule Take 1 capsule (100 mg total) by mouth 3 (three) times daily as needed for cough.   . [DISCONTINUED] cephALEXin (KEFLEX) 500 MG capsule Take 1 capsule (500 mg total) by mouth 4 (four) times daily for 10 days.   . [DISCONTINUED] clindamycin (CLINDAGEL) 1 % gel Apply topically 2 (two) times daily.   . [DISCONTINUED] dexamethasone (DECADRON) 2 MG tablet Take 2 mg by mouth 3 (three) times daily.   . [DISCONTINUED] doxycycline (VIBRA-TABS) 100 MG tablet Take 1 tablet (100 mg total) by mouth as directed. Take 1 po bid x 1 week then decrease to 1 po qd with food and plenty of fluid.   . [DISCONTINUED] escitalopram (LEXAPRO) 10 MG tablet Take one tab po qd for anxiety with food   . [DISCONTINUED] furosemide (LASIX) 40 MG tablet Take one to tabs once or twice days for fluid // swelling   . [DISCONTINUED] gabapentin (NEURONTIN) 100 MG capsule Take 1 capsule (100 mg total) by mouth in the morning, at noon, and at bedtime.   . [DISCONTINUED] gemfibrozil (LOPID) 600 MG tablet Take 1 tablet (600 mg total) by mouth 2 (two) times daily with a meal.   . [DISCONTINUED] glimepiride (AMARYL) 2 MG tablet Take 1 tablet (2 mg total) by mouth at bedtime.   . [DISCONTINUED] hydrALAZINE (APRESOLINE) 100 MG tablet Take 1 tablet (100 mg total) by mouth 3 (three) times daily.   . [DISCONTINUED] hydrOXYzine (ATARAX) 10 MG/5ML syrup Take 5 mLs (10 mg total) by mouth 3 (three) times daily as needed for itching.   . [DISCONTINUED] isosorbide mononitrate (IMDUR) 30 MG 24 hr tablet Take 1 tablet (30 mg total) by mouth daily.   . [DISCONTINUED] linagliptin (TRADJENTA) 5 MG TABS tablet Take 1 tablet (5 mg total) by mouth daily.   . [DISCONTINUED] loratadine (CLARITIN) 10 MG tablet Take 1 tablet  (10 mg total) by mouth daily as needed for allergies.   . [DISCONTINUED] methylPREDNISolone (MEDROL) 4 MG TBPK tablet Take by mouth as directed for 6 days   . [DISCONTINUED] metoprolol succinate (TOPROL-XL) 50 MG 24 hr tablet Take 1 tablet (50 mg total) by mouth at bedtime. Take with or immediately following a meal.   . [DISCONTINUED] omeprazole (PRILOSEC) 40 MG capsule Take 1 capsule (40 mg total) by mouth in the morning and at bedtime.   . [DISCONTINUED] potassium chloride SA (KLOR-CON) 20 MEQ tablet Take 1 tablet (20 mEq total) by mouth 2 (two) times daily.   . [DISCONTINUED] rosuvastatin (CRESTOR) 10 MG  tablet Take 1 tablet (10 mg total) by mouth daily.    No facility-administered encounter medications on file as of 07/17/2020.    Surgical History: Past Surgical History:  Procedure Laterality Date  . BACK SURGERY    . CARDIAC CATHETERIZATION    . CHOLECYSTECTOMY    . COLONOSCOPY WITH PROPOFOL N/A 04/12/2019   Procedure: COLONOSCOPY WITH PROPOFOL;  Surgeon: [name], MD;  Location: [facility] ENDOSCOPY;  Service: Gastroenterology;  Laterality: N/A;  . ESOPHAGOGASTRODUODENOSCOPY N/A 04/12/2019   Procedure: ESOPHAGOGASTRODUODENOSCOPY (EGD);  Surgeon: [name], MD;  Location: [facility] ENDOSCOPY;  Service: Gastroenterology;  Laterality: N/A;  . ESOPHAGOGASTRODUODENOSCOPY (EGD) WITH PROPOFOL N/A 11/21/2019   Procedure: ESOPHAGOGASTRODUODENOSCOPY (EGD) WITH PROPOFOL;  Surgeon: [name], MD;  Location: [facility] ENDOSCOPY;  Service: Gastroenterology;  Laterality: N/A;  . FLEXIBLE BRONCHOSCOPY Bilateral 05/17/2019   Procedure: FLEXIBLE BRONCHOSCOPY;  Surgeon: [name], MD;  Location: [name] ORS;  Service: Pulmonary;  Laterality: Bilateral;  . left arm surgery    . nephrectomy Left   . PARATHYROIDECTOMY    . RIGHT HEART CATH N/A 03/29/2019   Procedure: RIGHT HEART CATH;  Surgeon: [name], MD;  Location: [place] CV LAB;  Service: Cardiovascular;  Laterality:  N/A;    Medical History: Past Medical History:  Diagnosis Date  . Allergy   . Coronary artery disease   . Diabetes mellitus without complication ([place])   . Diastolic CHF ([place])   . GERD (gastroesophageal reflux disease)   . Hyperlipidemia   . Hypertension   . Renal insufficiency   . Sleep apnea     Family History: Family History  Problem Relation Age of Onset  . Diabetes Mother        2003  . Lung cancer Father   . Diabetes Sister   . Hypertension Sister   . Heart disease Sister   . Cancer Sister   . Bone cancer Brother     Social History   Socioeconomic History  . Marital status: Single    Spouse name: Not on file  . Number of children: Not on file  . Years of education: Not on file  . Highest education level: Not on file  Occupational History  . Not on file  Tobacco Use  . Smoking status: Former Smoker    Types: Cigarettes  . Smokeless tobacco: Former Systems developer    Types: Secondary school teacher  . Vaping Use: Never used  Substance and Sexual Activity  . Alcohol use: No  . Drug use: No  . Sexual activity: Not on file  Other Topics Concern  . Not on file  Social History Narrative  . Not on file   Social Determinants of Health   Financial Resource Strain: Not on file  Food Insecurity: Not on file  Transportation Needs: Not on file  Physical Activity: Not on file  Stress: Not on file  Social Connections: Not on file  Intimate Partner Violence: Not on file      Review of Systems  Constitutional: Negative for chills, fatigue and unexpected weight change.  HENT: Positive for postnasal drip. Negative for congestion, rhinorrhea, sneezing and sore throat.   Eyes: Negative for redness.  Respiratory: Negative for cough, chest tightness and shortness of breath.   Cardiovascular: Negative for chest pain and palpitations.  Gastrointestinal: Negative for abdominal pain, constipation, diarrhea, nausea and vomiting.  Genitourinary: Negative for dysuria and frequency.   Musculoskeletal: Negative for arthralgias, back pain, joint swelling and neck pain.  Skin:  Negative for rash.  Neurological: Negative.  Negative for tremors and numbness.  Hematological: Negative for adenopathy. Does not bruise/bleed easily.  Psychiatric/Behavioral: Negative for behavioral problems (Depression), sleep disturbance and suicidal ideas. The patient is not nervous/anxious.     Vital Signs: BP (!) 154/92   Pulse 93   Temp 97.7 F (36.5 C)   Resp 16   Ht _0  (1.803 m)   Wt 273 lb 3.2 oz (123.9 kg)   SpO2 98%   BMI 38.10 kg/m    Physical Exam Constitutional:      General: He is not in acute distress.    Appearance: He is well-developed. He is not diaphoretic.  HENT:     Head: Normocephalic and atraumatic.     Mouth/Throat:     Pharynx: No oropharyngeal exudate.  Eyes:     Pupils: Pupils are equal, round, and reactive to light.  Neck:     Thyroid: No thyromegaly.     Vascular: No JVD.     Trachea: No tracheal deviation.  Cardiovascular:     Rate and Rhythm: Normal rate and regular rhythm.     Heart sounds: Normal heart sounds. No murmur heard. No friction rub. No gallop.   Pulmonary:     Effort: Pulmonary effort is normal. No respiratory distress.     Breath sounds: No wheezing or rales.  Chest:     Chest wall: No tenderness.  Abdominal:     General: Bowel sounds are normal.     Palpations: Abdomen is soft.  Musculoskeletal:        General: Normal range of motion.     Cervical back: Normal range of motion and neck supple.  Lymphadenopathy:     Cervical: No cervical adenopathy.  Skin:    General: Skin is warm and dry.  Neurological:     Mental Status: He is alert and oriented to person, place, and time.     Cranial Nerves: No cranial nerve deficit.  Psychiatric:        Behavior: Behavior normal.        Thought Content: Thought content normal.        Judgment: Judgment normal.        Assessment/Plan: 1. Acute bronchitis with COPD ([place]) Pt  is instructed to use his MDI and duoneb as prescribed. Helped him to differentiate between anxiety and actual having sob/chest pain etc , he was started on lexapro but not sure if he is taking it   2. Type 2 diabetes mellitus with stage 3b chronic kidney disease, with long-term current use of insulin (HCC) Slightly increased in hg aic however most likely due to steroids, will monitor for now, does have CKD, will  - POCT HgB A1C - glimepiride (AMARYL) 2 MG tablet; Take one tab po qd with supper  Dispense: 90 tablet; Refill: 1  3. Chronic diastolic (congestive) heart failure (HCC) Continue to follow with Cardiology   4. Benign hypertension Continue therapy, has had low potasium in the past, will monitor  - potassium chloride SA (KLOR-CON) 20 MEQ tablet; Take 1 tablet (20 mEq total) by mouth 2 (two) times daily.  Dispense: 180 tablet; Refill: 1  5. Gastroesophageal reflux disease without esophagitis Increased prilosec to bid, instructed to not have later meals and elevate HOB to 45 degree angle  - omeprazole (PRILOSEC) 40 MG capsule; Take 1 capsule (40 mg total) by mouth in the morning and at bedtime.  Dispense: 180 capsule; Refill: 1  6. Neuropathy due to  type 2 diabetes mellitus (HCC) - gabapentin (NEURONTIN) 100 MG capsule; Take 1 capsule (100 mg total) by mouth in the morning, at noon, and at bedtime.  Dispense: 270 capsule; Refill: 1  7. GAD (generalized anxiety disorder) Refilled and instructed to take lexapro on a regular basis, might want to add low dose xanax, might need home health to help him with his frequent visits to ED  - escitalopram (LEXAPRO) 10 MG tablet; Take one tab po qd for anxiety with food  Dispense: 90 tablet; Refill: 3 General Counseling: Ona verbalizes understanding of the findings of todays visit and agrees with plan of treatment. I have discussed any further diagnostic evaluation that may be needed or ordered today. We also reviewed his medications today. he has  been encouraged to call the office with any questions or concerns that should arise related to todays visit.    Orders Placed This Encounter  Procedures  . POCT HgB A1C    Meds ordered this encounter  Medications  . potassium chloride SA (KLOR-CON) 20 MEQ tablet    Sig: Take 1 tablet (20 mEq total) by mouth 2 (two) times daily.    Dispense:  180 tablet    Refill:  1  . hydrALAZINE (APRESOLINE) 100 MG tablet    Sig: Take 1 tablet (100 mg total) by mouth 3 (three) times daily.    Dispense:  270 tablet    Refill:  1  . rosuvastatin (CRESTOR) 10 MG tablet    Sig: Take 1 tablet (10 mg total) by mouth daily.    Dispense:  90 tablet    Refill:  1  . glimepiride (AMARYL) 2 MG tablet    Sig: Take one tab po qd with supper    Dispense:  90 tablet    Refill:  1  . isosorbide mononitrate (IMDUR) 30 MG 24 hr tablet    Sig: Take 1 tablet (30 mg total) by mouth daily.    Dispense:  90 tablet    Refill:  1  . gabapentin (NEURONTIN) 100 MG capsule    Sig: Take 1 capsule (100 mg total) by mouth in the morning, at noon, and at bedtime.    Dispense:  270 capsule    Refill:  1  . omeprazole (PRILOSEC) 40 MG capsule    Sig: Take 1 capsule (40 mg total) by mouth in the morning and at bedtime.    Dispense:  180 capsule    Refill:  1  . furosemide (LASIX) 40 MG tablet    Sig: Take one to tabs once or twice days for fluid // swelling    Dispense:  180 tablet    Refill:  1  . metoprolol succinate (TOPROL-XL) 50 MG 24 hr tablet    Sig: Take 1 tablet (50 mg total) by mouth at bedtime. Take with or immediately following a meal.    Dispense:  90 tablet    Refill:  1  . escitalopram (LEXAPRO) 10 MG tablet    Sig: Take one tab po qd for anxiety with food    Dispense:  90 tablet    Refill:  3  . DISCONTD: mometasone-formoterol (DULERA) 200-5 MCG/ACT AERO    Sig: Inhale 2 puffs into the lungs 2 (two) times daily.    Dispense:  3 each    Refill:  3  . albuterol (VENTOLIN HFA) 108 (90 Base) MCG/ACT  inhaler    Sig: Inhale 2 puffs into the lungs every 6 (six) hours as needed for  wheezing or shortness of breath.    Dispense:  18 g    Refill:  0    Pt needs 3 inhalers for 3 months supply    Total time spent:35 Minutes Time spent includes review of chart, medications, test results, and follow up plan with the patient.   [place] Controlled Substance Database was reviewed by me.   Dr Lavera Guise Internal medicine

## 2020-07-20 ENCOUNTER — Other Ambulatory Visit: Payer: Self-pay

## 2020-07-20 ENCOUNTER — Encounter: Payer: Self-pay | Admitting: Emergency Medicine

## 2020-07-20 ENCOUNTER — Other Ambulatory Visit: Payer: Self-pay | Admitting: Hospice and Palliative Medicine

## 2020-07-20 ENCOUNTER — Telehealth: Payer: Self-pay

## 2020-07-20 ENCOUNTER — Emergency Department: Payer: Medicare PPO

## 2020-07-20 ENCOUNTER — Emergency Department
Admission: EM | Admit: 2020-07-20 | Discharge: 2020-07-20 | Disposition: A | Discharge status: Home / Self Care | Payer: Medicare PPO | Attending: Emergency Medicine | Admitting: Emergency Medicine

## 2020-07-20 DIAGNOSIS — Z7984 Long term (current) use of oral hypoglycemic drugs: Secondary | ICD-10-CM | POA: Insufficient documentation

## 2020-07-20 DIAGNOSIS — I5033 Acute on chronic diastolic (congestive) heart failure: Secondary | ICD-10-CM | POA: Diagnosis not present

## 2020-07-20 DIAGNOSIS — J441 Chronic obstructive pulmonary disease with (acute) exacerbation: Secondary | ICD-10-CM | POA: Insufficient documentation

## 2020-07-20 DIAGNOSIS — Z85528 Personal history of other malignant neoplasm of kidney: Secondary | ICD-10-CM | POA: Insufficient documentation

## 2020-07-20 DIAGNOSIS — R0789 Other chest pain: Secondary | ICD-10-CM | POA: Diagnosis not present

## 2020-07-20 DIAGNOSIS — Z8616 Personal history of COVID-19: Secondary | ICD-10-CM | POA: Diagnosis not present

## 2020-07-20 DIAGNOSIS — J189 Pneumonia, unspecified organism: Secondary | ICD-10-CM | POA: Diagnosis not present

## 2020-07-20 DIAGNOSIS — E1142 Type 2 diabetes mellitus with diabetic polyneuropathy: Secondary | ICD-10-CM | POA: Insufficient documentation

## 2020-07-20 DIAGNOSIS — I130 Hypertensive heart and chronic kidney disease with heart failure and stage 1 through stage 4 chronic kidney disease, or unspecified chronic kidney disease: Secondary | ICD-10-CM | POA: Diagnosis not present

## 2020-07-20 DIAGNOSIS — E1122 Type 2 diabetes mellitus with diabetic chronic kidney disease: Secondary | ICD-10-CM | POA: Insufficient documentation

## 2020-07-20 DIAGNOSIS — Z79899 Other long term (current) drug therapy: Secondary | ICD-10-CM | POA: Insufficient documentation

## 2020-07-20 DIAGNOSIS — Z87891 Personal history of nicotine dependence: Secondary | ICD-10-CM | POA: Diagnosis not present

## 2020-07-20 DIAGNOSIS — I2510 Atherosclerotic heart disease of native coronary artery without angina pectoris: Secondary | ICD-10-CM | POA: Insufficient documentation

## 2020-07-20 DIAGNOSIS — R079 Chest pain, unspecified: Secondary | ICD-10-CM | POA: Diagnosis not present

## 2020-07-20 DIAGNOSIS — I10 Essential (primary) hypertension: Secondary | ICD-10-CM | POA: Diagnosis not present

## 2020-07-20 DIAGNOSIS — N1832 Chronic kidney disease, stage 3b: Secondary | ICD-10-CM | POA: Insufficient documentation

## 2020-07-20 DIAGNOSIS — J9811 Atelectasis: Secondary | ICD-10-CM | POA: Diagnosis not present

## 2020-07-20 DIAGNOSIS — Z794 Long term (current) use of insulin: Secondary | ICD-10-CM | POA: Diagnosis not present

## 2020-07-20 DIAGNOSIS — E1165 Type 2 diabetes mellitus with hyperglycemia: Secondary | ICD-10-CM | POA: Diagnosis not present

## 2020-07-20 DIAGNOSIS — R0602 Shortness of breath: Secondary | ICD-10-CM | POA: Diagnosis not present

## 2020-07-20 LAB — BASIC METABOLIC PANEL
Anion gap: 9 (ref 5–15)
BUN: 47 mg/dL — ABNORMAL HIGH (ref 8–23)
CO2: 20 mmol/L — ABNORMAL LOW (ref 22–32)
Calcium: 8.8 mg/dL — ABNORMAL LOW (ref 8.9–10.3)
Chloride: 110 mmol/L (ref 98–111)
Creatinine, Ser: 2.29 mg/dL — ABNORMAL HIGH (ref 0.61–1.24)
GFR, Estimated: 31 mL/min — ABNORMAL LOW (ref >60)
Glucose, Bld: 250 mg/dL — ABNORMAL HIGH (ref 70–99)
Potassium: 4.5 mmol/L (ref 3.5–5.1)
Sodium: 139 mmol/L (ref 135–145)

## 2020-07-20 LAB — HEPATIC FUNCTION PANEL
ALT: 16 U/L (ref 0–44)
AST: 21 U/L (ref 15–41)
Albumin: 3.3 g/dL — ABNORMAL LOW (ref 3.5–5.0)
Alkaline Phosphatase: 80 U/L (ref 38–126)
Bilirubin, Direct: <0.1 mg/dL (ref 0.0–0.2)
Total Bilirubin: 0.4 mg/dL (ref 0.3–1.2)
Total Protein: 6.7 g/dL (ref 6.5–8.1)

## 2020-07-20 LAB — CBC
HCT: 35.2 % — ABNORMAL LOW (ref 39.0–52.0)
Hemoglobin: 11.5 g/dL — ABNORMAL LOW (ref 13.0–17.0)
MCH: 29.5 pg (ref 26.0–34.0)
MCHC: 32.7 g/dL (ref 30.0–36.0)
MCV: 90.3 fL (ref 80.0–100.0)
Platelets: 207 10*3/uL (ref 150–400)
RBC: 3.9 MIL/uL — ABNORMAL LOW (ref 4.22–5.81)
RDW: 13.1 % (ref 11.5–15.5)
WBC: 6.4 10*3/uL (ref 4.0–10.5)
nRBC: 0 % (ref 0.0–0.2)

## 2020-07-20 LAB — TROPONIN I (HIGH SENSITIVITY)
Troponin I (High Sensitivity): 14 ng/L (ref <18)
Troponin I (High Sensitivity): 13 ng/L (ref <18)

## 2020-07-20 LAB — BRAIN NATRIURETIC PEPTIDE: B Natriuretic Peptide: 84.6 pg/mL (ref 0.0–100.0)

## 2020-07-20 MED ORDER — PREDNISONE 20 MG PO TABS
60.0000 mg | ORAL_TABLET | Freq: Once | ORAL | Status: AC
  Administered 2020-07-20: 60 mg via ORAL
  Filled 2020-07-20: qty 3

## 2020-07-20 MED ORDER — PREDNISONE 20 MG PO TABS: 40.0000 mg | ORAL_TABLET | Freq: Every day | ORAL | 0 refills | Status: AC

## 2020-07-20 MED ORDER — BUDESONIDE-FORMOTEROL FUMARATE 160-4.5 MCG/ACT IN AERO
2.0000 | INHALATION_SPRAY | Freq: Two times a day (BID) | RESPIRATORY_TRACT | 3 refills | Status: DC
Start: 2020-07-20 — End: 2021-02-23

## 2020-07-20 MED ORDER — DOXYCYCLINE HYCLATE 100 MG PO CAPS: 100.0000 mg | ORAL_CAPSULE | Freq: Two times a day (BID) | ORAL | 0 refills | Status: AC

## 2020-07-20 MED ORDER — DOXYCYCLINE HYCLATE 100 MG PO TABS
100.0000 mg | ORAL_TABLET | Freq: Once | ORAL | Status: AC
  Administered 2020-07-20: 100 mg via ORAL
  Filled 2020-07-20: qty 1

## 2020-07-20 NOTE — ED Provider Notes (Signed)
[facility] Emergency Department Provider Note  ____________________________________________   Event Date/Time   First MD Initiated Contact with Patient 07/20/20 1939     (approximate)  I have reviewed the triage vital signs and the nursing notes.   HISTORY  Chief Complaint Chest Pain    HPI Larry Callahan is a 65 y.o. male  With h/o DM, CHF, CAD, HTN, HLD here with cough, SOB, and chest pain. Pt has a well documented h/o recurrent ED visits for same. Reports that since his ED visit on 1/29, he has had persistent cough, occasional sputum production with blood-tinged emesis. He has had ongoing SOB. He has had a dull, pressure like CP that has returned over the past 2-3 days and has been increasingly severe. Pain is worse with coughing and palpation. No alleviating factors. No specific pleuritic component. No blood thinner use. No n/v/d.        Past Medical History:  Diagnosis Date  . Allergy   . Coronary artery disease   . Diabetes mellitus without complication ([place])   . Diastolic CHF ([place])   . GERD (gastroesophageal reflux disease)   . Hyperlipidemia   . Hypertension   . Renal insufficiency   . Sleep apnea     Patient Active Problem List   Diagnosis Date Noted  . Gastroesophageal reflux disease without esophagitis 04/19/2020  . Pneumonia due to COVID-19 virus 01/30/2020  . Elevated troponin 01/29/2020  . Suspected COVID-19 virus infection 01/29/2020  . Postural dizziness with presyncope 01/29/2020  . Hematemesis 01/09/2020  . Hospital discharge follow-up 11/26/2019  . Cellulitis of left hand 09/26/2019  . Generalized weakness 08/08/2019  . BMI 35.0-35.9,adult 08/08/2019  . Acute right hip pain 06/17/2019  . Inability to ambulate due to hip 06/17/2019  . CKD stage 3 due to type 2 diabetes mellitus ([place])   . Hypervolemia   . Acute on chronic diastolic CHF (congestive heart failure) ([place])   . Type 2 diabetes mellitus with stage 3b  chronic kidney disease, with long-term current use of insulin ([place])   . Hypomagnesemia   . Full code status 05/20/2019  . Pleuritic chest pain   . Multifocal pneumonia 05/19/2019  . Acute kidney injury superimposed on CKD 3b ([place]) 05/09/2019  . Hemoptysis 04/09/2019  . Shortness of breath 03/26/2019  . Uncontrolled type 2 diabetes mellitus with insulin therapy ([place]) 03/12/2019  . Acute on chronic heart failure with preserved ejection fraction (HFpEF) ([place])   . Chronic kidney disease, stage 3b ([place])   . Chronic systolic CHF (congestive heart failure) ([place]) 03/01/2019  . Diastolic dysfunction 46/96/2952  . Iron deficiency anemia 12/31/2018  . Atopic dermatitis 11/24/2018  . Screening for Larry cancer 11/06/2017  . Uncontrolled type 2 diabetes mellitus with hyperglycemia ([place]) 11/06/2017  . Hidradenitis 06/09/2017  . Atherosclerotic heart disease of native coronary artery without angina pectoris 06/09/2017  . 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 Obstructive sleep apnea, adult 06/09/2017  . Morbid (severe) obesity due to excess calories ([place]) 06/09/2017  . Cigarette nicotine dependence with nicotine-induced disorder 06/09/2017  . Diabetic polyneuropathy associated with type 2 diabetes mellitus ([place]) 06/09/2017  . Allergic rhinitis due to pollen 06/09/2017  . Mixed hyperlipidemia 06/09/2017  . COPD (chronic obstructive pulmonary disease) ([place]) 06/09/2017  . Dyspnea on exertion 06/09/2017  . Wheezing 06/09/2017  . Dysuria 06/09/2017  . Snoring 06/09/2017  . Essential hypertension 06/09/2017  . Personal history of other malignant neoplasm of kidney 06/09/2017  .  Pain in right hip 06/09/2017  . Impacted cerumen, bilateral 06/09/2017  . Secondary hyperparathyroidism, not elsewhere classified ([place]) 06/09/2017  . Type 2 diabetes mellitus with hyperglycemia ([place]) 06/09/2017  . Tinea corporis 06/09/2017    Past Surgical History:  Procedure Laterality  Date  . BACK SURGERY    . CARDIAC CATHETERIZATION    . CHOLECYSTECTOMY    . COLONOSCOPY WITH PROPOFOL N/A 04/12/2019   Procedure: COLONOSCOPY WITH PROPOFOL;  Surgeon: [name], MD;  Location: [facility] ENDOSCOPY;  Service: Gastroenterology;  Laterality: N/A;  . ESOPHAGOGASTRODUODENOSCOPY N/A 04/12/2019   Procedure: ESOPHAGOGASTRODUODENOSCOPY (EGD);  Surgeon: [name], MD;  Location: [facility] ENDOSCOPY;  Service: Gastroenterology;  Laterality: N/A;  . ESOPHAGOGASTRODUODENOSCOPY (EGD) WITH PROPOFOL N/A 11/21/2019   Procedure: ESOPHAGOGASTRODUODENOSCOPY (EGD) WITH PROPOFOL;  Surgeon: [name], MD;  Location: [facility] ENDOSCOPY;  Service: Gastroenterology;  Laterality: N/A;  . FLEXIBLE BRONCHOSCOPY Bilateral 05/17/2019   Procedure: FLEXIBLE BRONCHOSCOPY;  Surgeon: [name], MD;  Location: [name] ORS;  Service: Pulmonary;  Laterality: Bilateral;  . left arm surgery    . nephrectomy Left   . PARATHYROIDECTOMY    . RIGHT HEART CATH N/A 03/29/2019   Procedure: RIGHT HEART CATH;  Surgeon: [name], MD;  Location: [facility] LAB;  Service: Cardiovascular;  Laterality: N/A;    Prior to Admission medications   Medication Sig Start Date End Date Taking? Authorizing Provider  doxycycline (VIBRAMYCIN) 100 MG capsule Take 1 capsule (100 mg total) by mouth 2 (two) times daily for 7 days. 07/20/20 07/27/20 Yes Duffy Bruce, MD  predniSONE (DELTASONE) 20 MG tablet Take 2 tablets (40 mg total) by mouth daily for 5 days. 07/20/20 07/25/20 Yes Duffy Bruce, MD  acetaminophen (TYLENOL) 325 MG tablet Take 2 tablets (650 mg total) by mouth every 4 (four) hours as needed for headache or mild pain. 03/05/19   Gouru, Illene Silver, MD  albuterol (VENTOLIN HFA) 108 (90 Base) MCG/ACT inhaler Inhale 2 puffs into the lungs every 6 (six) hours as needed for wheezing or shortness of breath. 07/17/20   Lavera Guise, MD  ascorbic acid (VITAMIN C) 500 MG tablet Take 1 tablet (500 mg total) by mouth  daily. 02/04/20   Oswald Hillock, MD  Blood Glucose Calibration (TRUE METRIX LEVEL 1) Low SOLN Use as directed 04/04/18   [name], NP  Blood Glucose Monitoring Suppl (TRUE METRIX AIR GLUCOSE METER) DEVI 1 Device by Does not apply route 2 (two) times daily. 04/13/18   [name], NP  budesonide-formoterol (SYMBICORT) 160-4.5 MCG/ACT inhaler Inhale 2 puffs into the lungs 2 (two) times daily. 07/20/20   Lavera Guise, MD  escitalopram Loma Sousa) 10 MG tablet Take one tab po qd for anxiety with food 07/17/20   Lavera Guise, MD  ferrous gluconate (FERGON) 324 MG tablet Take 1 tablet (324 mg total) by mouth daily with breakfast. 08/01/19   [name], NP  furosemide (LASIX) 40 MG tablet Take one to tabs once or twice days for fluid // swelling 07/17/20   Lavera Guise, MD  gabapentin (NEURONTIN) 100 MG capsule Take 1 capsule (100 mg total) by mouth in the morning, at noon, and at bedtime. 07/17/20   Lavera Guise, MD  glimepiride Jari Sportsman) 2 MG tablet Take one tab po qd with supper 07/17/20   Lavera Guise, MD  glucose blood (TRUE METRIX BLOOD GLUCOSE TEST) test strip Use as instructed twice daily diag E11.65 07/30/19   [name], NP  hydrALAZINE (APRESOLINE) 100 MG tablet Take 1 tablet (100 mg total) by mouth 3 (three) times daily. 07/17/20   Lavera Guise, MD  insulin glargine, 1 Unit Dial, (TOUJEO) 300 UNIT/ML Solostar Pen Inject 26 Units into the skin daily. 04/30/20   [name], NP  Insulin Pen Needle 32G X 6 MM MISC Use as directed 03/25/20   [name], NP  ipratropium-albuterol (DUONEB) 0.5-2.5 (3) MG/3ML SOLN Take 3 mLs by nebulization every 6 (six) hours as needed. 03/08/19   Kendell Bane, NP  isosorbide mononitrate (IMDUR) 30 MG 24 hr tablet Take 1 tablet (30 mg total) by mouth daily. 07/17/20   Lavera Guise, MD  metoprolol succinate (TOPROL-XL) 50 MG 24 hr tablet Take 1 tablet (50 mg total) by mouth at bedtime. Take with or immediately following a meal. 07/17/20    Lavera Guise, MD  nitroGLYCERIN (NITROSTAT) 0.4 MG SL tablet Place 1 tablet (0.4 mg total) under the tongue every 5 (five) minutes x 3 doses as needed for chest pain. 05/24/19   Loletha Grayer, MD  omeprazole (PRILOSEC) 40 MG capsule Take 1 capsule (40 mg total) by mouth in the morning and at bedtime. 07/17/20   Lavera Guise, MD  potassium chloride SA (KLOR-CON) 20 MEQ tablet Take 1 tablet (20 mEq total) by mouth 2 (two) times daily. 07/17/20   Lavera Guise, MD  rosuvastatin (CRESTOR) 10 MG tablet Take 1 tablet (10 mg total) by mouth daily. 07/17/20 10/15/20  Lavera Guise, MD  tiotropium (SPIRIVA HANDIHALER) 18 MCG inhalation capsule Place 1 capsule (18 mcg total) into inhaler and inhale daily. 06/02/20 08/31/20  [name], NP  TRUEplus Lancets 33G MISC Use as directed twice daily diag E11.65 07/30/19   [name], NP  vitamin B-12 (CYANOCOBALAMIN) 1000 MCG tablet Take 1 tablet (1,000 mcg total) by mouth daily. 11/21/19   Enzo Bi, MD  zinc sulfate 220 (50 Zn) MG capsule Take 1 capsule (220 mg total) by mouth daily. 02/04/20   Oswald Hillock, MD    Allergies Penicillins  Family History  Problem Relation Age of Onset  . Diabetes Mother        2003  . Lung cancer Father   . Diabetes Sister   . Hypertension Sister   . Heart disease Sister   . Cancer Sister   . Bone cancer Brother     Social History Social History   Tobacco Use  . Smoking status: Former Smoker    Types: Cigarettes  . Smokeless tobacco: Former Systems developer    Types: Secondary school teacher  . Vaping Use: Never used  Substance Use Topics  . Alcohol use: No  . Drug use: No    Review of Systems  Review of Systems  Constitutional: Positive for fatigue. Negative for chills and fever.  HENT: Negative for sore throat.   Respiratory: Positive for cough, chest tightness and shortness of breath.   Cardiovascular: Positive for chest pain.  Gastrointestinal: Negative for abdominal pain.  Genitourinary: Negative for flank  pain.  Musculoskeletal: Negative for neck pain.  Skin: Negative for rash and wound.  Allergic/Immunologic: Negative for immunocompromised state.  Neurological: Negative for weakness and numbness.  Hematological: Does not bruise/bleed easily.  All other systems reviewed and are negative.    ____________________________________________  PHYSICAL EXAM:      VITAL SIGNS: ED Triage Vitals  Enc Vitals Group     BP 07/20/20 1656 (!) 161/85  Pulse Rate 07/20/20 1656 64     Resp 07/20/20 1656 20     Temp 07/20/20 1656 98.3 F (36.8 C)     Temp Source 07/20/20 1656 Oral     SpO2 07/20/20 1656 94 %     Weight 07/20/20 1650 273 lb (123.8 kg)     Height 07/20/20 1650 _0  (1.803 m)     Head Circumference --      Peak Flow --      Pain Score 07/20/20 1650 6     Pain Loc --      Pain Edu? --      Excl. in [place]? --      Physical Exam Vitals and nursing note reviewed.  Constitutional:      General: He is not in acute distress.    Appearance: He is well-developed and well-nourished.  HENT:     Head: Normocephalic and atraumatic.  Eyes:     Conjunctiva/sclera: Conjunctivae normal.  Cardiovascular:     Rate and Rhythm: Normal rate and regular rhythm.     Heart sounds: Normal heart sounds.  Pulmonary:     Effort: Pulmonary effort is normal. No respiratory distress.     Breath sounds: Wheezing (scant, expiratory) present.  Abdominal:     General: There is no distension.  Musculoskeletal:        General: No edema.     Cervical back: Neck supple.  Skin:    General: Skin is warm.     Capillary Refill: Capillary refill takes less than 2 seconds.     Findings: No rash.  Neurological:     Mental Status: He is alert and oriented to person, place, and time.     Motor: No abnormal muscle tone.       ____________________________________________   LABS (all labs ordered are listed, but only abnormal results are displayed)  Labs Reviewed  BASIC METABOLIC PANEL - Abnormal;  Notable for the following components:      Result Value   CO2 20 (*)    Glucose, Bld 250 (*)    BUN 47 (*)    Creatinine, Ser 2.29 (*)    Calcium 8.8 (*)    GFR, Estimated 31 (*)    All other components within normal limits  CBC - Abnormal; Notable for the following components:   RBC 3.90 (*)    Hemoglobin 11.5 (*)    HCT 35.2 (*)    All other components within normal limits  HEPATIC FUNCTION PANEL - Abnormal; Notable for the following components:   Albumin 3.3 (*)    All other components within normal limits  BRAIN NATRIURETIC PEPTIDE  TROPONIN I (HIGH SENSITIVITY)  TROPONIN I (HIGH SENSITIVITY)    ____________________________________________  EKG: Normal sinus rhythm, ventricular rate 65.  PR 180, QRS 86, QTc 393.  No acute ST elevations or depressions. ________________________________________  RADIOLOGY All imaging, including plain films, CT scans, and ultrasounds, independently reviewed by me, and interpretations confirmed via formal radiology reads.  ED MD interpretation:   Chest x-ray: Clear, streaky bibasilar atelectasis  Official radiology report(s): DG Chest 2 View  Result Date: 07/20/2020 CLINICAL DATA:  Chest pain and shortness of breath. EXAM: CHEST - 2 VIEW COMPARISON:  Chest x-ray 07/11/2020 FINDINGS: The cardiac silhouette, mediastinal and hilar contours are within normal limits and stable. Vascular crowding and streaky basilar atelectasis but no definite infiltrates or effusions. The bony thorax is intact. IMPRESSION: Vascular crowding and streaky basilar atelectasis. Electronically Signed   By:  Marijo Sanes M.D.   On: 07/20/2020 17:34    ____________________________________________  PROCEDURES   Procedure(s) performed (including Critical Care):  Procedures  ____________________________________________  INITIAL IMPRESSION / MDM / [place] / ED COURSE  As part of my medical decision making, I reviewed the following data within the  [place] notes reviewed and incorporated, Old chart reviewed, Notes from prior ED visits, and [place] Controlled Substance Database       *Larry Callahan was evaluated in Emergency Department on 07/21/2020 for the symptoms described in the history of present illness. He was evaluated in the context of the global COVID-19 pandemic, which necessitated consideration that the patient might be at risk for infection with the SARS-CoV-2 virus that causes COVID-19. Institutional protocols and algorithms that pertain to the evaluation of patients at risk for COVID-19 are in a state of rapid change based on information released by regulatory bodies including the CDC and federal and state organizations. These policies and algorithms were followed during the patient's care in the ED.  Some ED evaluations and interventions may be delayed as a result of limited staffing during the pandemic.*     Medical Decision Making: 65 year old male here with cough, chest pain, shortness of breath. Pt is afebrile, well appearing on arrival. CBC without significant abnormality. BMP with slightly Cr elevation though seems to be within his normal range per review of records. CXR shows no PNA. EKG nonischemic and troponins negative x 2 despite CKD - doubt ACS. Pt has a well documented h/o similar pain with negative CT Angio/PE work up and no clinical signs of DVT/PE, doubt PE.   Suspect COPD exacerbation vs bronchitis vs CAP, possibly partially treated by the keflex he has been on for skin infection. No signs of significant resp distress. Will place pt on steroids, doxy for atypical (and to complete his cellulitis) coverage, and advise PCP f/u. ____________________________________________  FINAL CLINICAL IMPRESSION(S) / ED DIAGNOSES  Final diagnoses:  Atypical chest pain  COPD exacerbation ([place])     MEDICATIONS GIVEN DURING THIS VISIT:  Medications  predniSONE (DELTASONE) tablet 60 mg (60 mg  Oral Given 07/20/20 2102)  doxycycline (VIBRA-TABS) tablet 100 mg (100 mg Oral Given 07/20/20 2103)     ED Discharge Orders         Ordered    predniSONE (DELTASONE) 20 MG tablet  Daily        07/20/20 2101    doxycycline (VIBRAMYCIN) 100 MG capsule  2 times daily        07/20/20 2101           Note:  This document was prepared using Dragon voice recognition software and may include unintentional dictation errors.   Duffy Bruce, MD 07/21/20 [phone]

## 2020-07-20 NOTE — ED Triage Notes (Signed)
Pt via EMS from home. Pt was seen last weekend for CP and was dx pneumonia and prescribed with antibiotics pt states he has been taking them as prescribed. Pt c/o centralized CP since Thursday. Pt also c/o SOB. Denies NVD. Pt is A&Ox4 and NAD.

## 2020-07-20 NOTE — Telephone Encounter (Signed)
Pt called and said he had chest pain on and off all day and pain down his right arm, he also said he has sever SOB. He used his inhaler and did a breathing treatment but that did not help. Spoke with Dr.Fozia Humphrey Rolls and said to take 3 Asprin (pt stated he did not have any) and she was going to give him meds for anxiety. Pt said he wasn't anxious and refused meds stating he had no way to get them. Lovena Le spoke with pt and asked if he could come into the office and he again stated he did not have transportation, at this point he also mentioned he was dizzy. She explained to him he needs to go to the ED and he would not be stranded there, and that they could provided a ride home.

## 2020-07-23 DIAGNOSIS — I5043 Acute on chronic combined systolic (congestive) and diastolic (congestive) heart failure: Secondary | ICD-10-CM | POA: Diagnosis not present

## 2020-07-29 ENCOUNTER — Telehealth: Payer: Self-pay

## 2020-07-29 NOTE — Telephone Encounter (Signed)
Per Dr Clayborn Bigness I tried to call pt a few times to check on him and ask if he was still taking Lexapro and how his chest pain. LMOM for pt to return call.

## 2020-07-29 NOTE — Telephone Encounter (Signed)
-----   Message from Lavera Guise, MD sent at 07/24/2020 12:00 PM EST ----- Can u ask me please

## 2020-08-10 IMAGING — DX PORTABLE CHEST - 1 VIEW
1 series · 1 of 1 positions shown · non-contrast
Comparison: Chest x-rays dated 03/06/2013 and 08/10/2011.

CLINICAL DATA: Increasing shortness of breath, worse with exertion.

EXAM:
PORTABLE CHEST 1 VIEW

[chest ap]
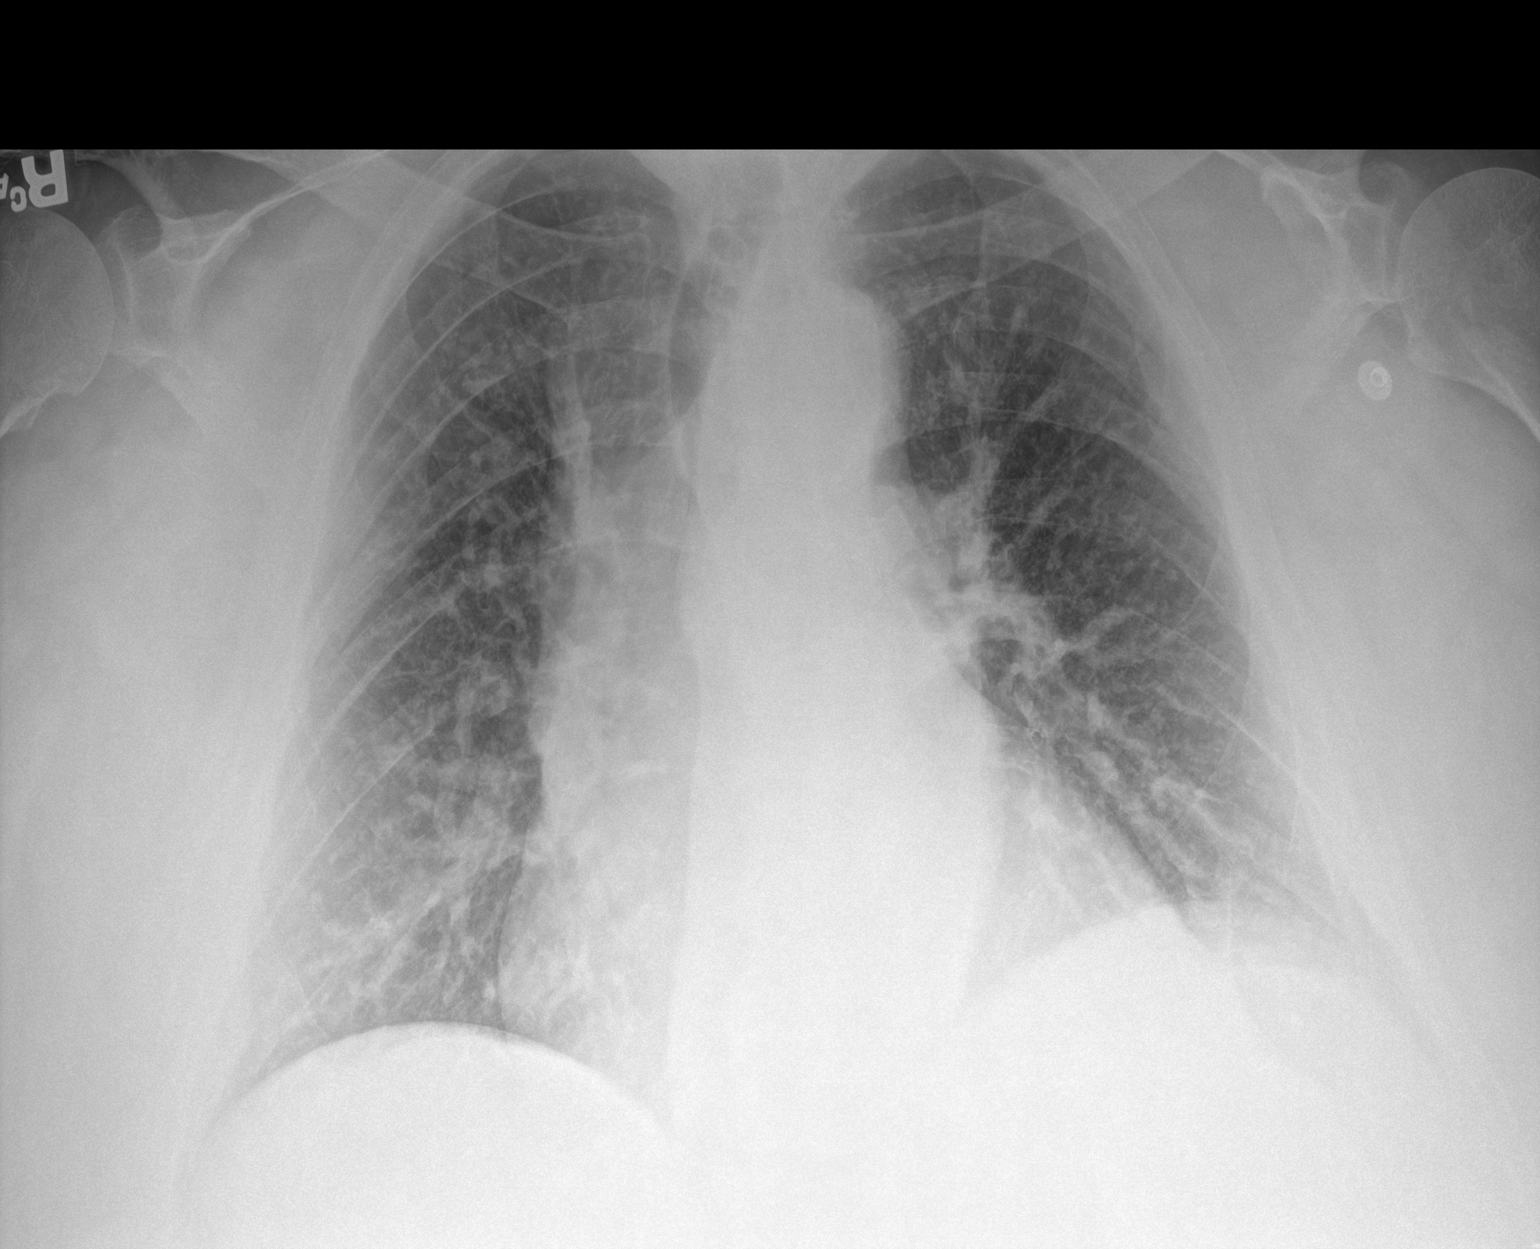

[1 of 1 positions shown; findings below may reference images not displayed]

FINDINGS: Heart size and mediastinal contours are stable. Lungs are clear. No
pleural effusion or pneumothorax seen. Osseous structures about the
chest are unremarkable.
IMPRESSION: No active disease. No evidence of pneumonia or pulmonary edema.

## 2020-08-20 DIAGNOSIS — I5043 Acute on chronic combined systolic (congestive) and diastolic (congestive) heart failure: Secondary | ICD-10-CM | POA: Diagnosis not present

## 2020-08-25 ENCOUNTER — Encounter: Payer: Medicare PPO | Admitting: Dermatology

## 2020-08-31 ENCOUNTER — Other Ambulatory Visit: Payer: Self-pay | Admitting: Internal Medicine

## 2020-09-07 ENCOUNTER — Telehealth: Payer: Self-pay

## 2020-09-07 NOTE — Telephone Encounter (Signed)
Completed medical records for [facility]  Mailed to [address], [place] [address]

## 2020-09-18 ENCOUNTER — Other Ambulatory Visit: Payer: Self-pay

## 2020-09-18 ENCOUNTER — Emergency Department: Payer: Medicare PPO

## 2020-09-18 ENCOUNTER — Emergency Department
Admission: EM | Admit: 2020-09-18 | Discharge: 2020-09-18 | Disposition: A | Discharge status: Home / Self Care | Payer: Medicare PPO | Attending: Emergency Medicine | Admitting: Emergency Medicine

## 2020-09-18 DIAGNOSIS — Z8616 Personal history of COVID-19: Secondary | ICD-10-CM | POA: Insufficient documentation

## 2020-09-18 DIAGNOSIS — R0689 Other abnormalities of breathing: Secondary | ICD-10-CM | POA: Diagnosis not present

## 2020-09-18 DIAGNOSIS — I5033 Acute on chronic diastolic (congestive) heart failure: Secondary | ICD-10-CM | POA: Insufficient documentation

## 2020-09-18 DIAGNOSIS — R0789 Other chest pain: Secondary | ICD-10-CM | POA: Insufficient documentation

## 2020-09-18 DIAGNOSIS — E1165 Type 2 diabetes mellitus with hyperglycemia: Secondary | ICD-10-CM | POA: Diagnosis not present

## 2020-09-18 DIAGNOSIS — Z79899 Other long term (current) drug therapy: Secondary | ICD-10-CM | POA: Diagnosis not present

## 2020-09-18 DIAGNOSIS — R059 Cough, unspecified: Secondary | ICD-10-CM | POA: Insufficient documentation

## 2020-09-18 DIAGNOSIS — Z7984 Long term (current) use of oral hypoglycemic drugs: Secondary | ICD-10-CM | POA: Diagnosis not present

## 2020-09-18 DIAGNOSIS — I5022 Chronic systolic (congestive) heart failure: Secondary | ICD-10-CM | POA: Insufficient documentation

## 2020-09-18 DIAGNOSIS — Z87891 Personal history of nicotine dependence: Secondary | ICD-10-CM | POA: Diagnosis not present

## 2020-09-18 DIAGNOSIS — J9811 Atelectasis: Secondary | ICD-10-CM | POA: Diagnosis not present

## 2020-09-18 DIAGNOSIS — Z794 Long term (current) use of insulin: Secondary | ICD-10-CM | POA: Diagnosis not present

## 2020-09-18 DIAGNOSIS — J449 Chronic obstructive pulmonary disease, unspecified: Secondary | ICD-10-CM | POA: Diagnosis not present

## 2020-09-18 DIAGNOSIS — R42 Dizziness and giddiness: Secondary | ICD-10-CM | POA: Insufficient documentation

## 2020-09-18 DIAGNOSIS — Z7951 Long term (current) use of inhaled steroids: Secondary | ICD-10-CM | POA: Insufficient documentation

## 2020-09-18 DIAGNOSIS — N1832 Chronic kidney disease, stage 3b: Secondary | ICD-10-CM | POA: Diagnosis not present

## 2020-09-18 DIAGNOSIS — I130 Hypertensive heart and chronic kidney disease with heart failure and stage 1 through stage 4 chronic kidney disease, or unspecified chronic kidney disease: Secondary | ICD-10-CM | POA: Insufficient documentation

## 2020-09-18 DIAGNOSIS — R0602 Shortness of breath: Secondary | ICD-10-CM | POA: Insufficient documentation

## 2020-09-18 DIAGNOSIS — I2510 Atherosclerotic heart disease of native coronary artery without angina pectoris: Secondary | ICD-10-CM | POA: Diagnosis not present

## 2020-09-18 DIAGNOSIS — E1122 Type 2 diabetes mellitus with diabetic chronic kidney disease: Secondary | ICD-10-CM | POA: Diagnosis not present

## 2020-09-18 DIAGNOSIS — J40 Bronchitis, not specified as acute or chronic: Secondary | ICD-10-CM

## 2020-09-18 DIAGNOSIS — R079 Chest pain, unspecified: Secondary | ICD-10-CM | POA: Diagnosis not present

## 2020-09-18 LAB — CBC
HCT: 35.7 % — ABNORMAL LOW (ref 39.0–52.0)
Hemoglobin: 11.6 g/dL — ABNORMAL LOW (ref 13.0–17.0)
MCH: 28.7 pg (ref 26.0–34.0)
MCHC: 32.5 g/dL (ref 30.0–36.0)
MCV: 88.4 fL (ref 80.0–100.0)
Platelets: 193 10*3/uL (ref 150–400)
RBC: 4.04 MIL/uL — ABNORMAL LOW (ref 4.22–5.81)
RDW: 13.2 % (ref 11.5–15.5)
WBC: 6.5 10*3/uL (ref 4.0–10.5)
nRBC: 0 % (ref 0.0–0.2)

## 2020-09-18 LAB — COMPREHENSIVE METABOLIC PANEL
ALT: 18 U/L (ref 0–44)
AST: 18 U/L (ref 15–41)
Albumin: 3 g/dL — ABNORMAL LOW (ref 3.5–5.0)
Alkaline Phosphatase: 87 U/L (ref 38–126)
Anion gap: 10 (ref 5–15)
BUN: 38 mg/dL — ABNORMAL HIGH (ref 8–23)
CO2: 19 mmol/L — ABNORMAL LOW (ref 22–32)
Calcium: 9 mg/dL (ref 8.9–10.3)
Chloride: 109 mmol/L (ref 98–111)
Creatinine, Ser: 2.12 mg/dL — ABNORMAL HIGH (ref 0.61–1.24)
GFR, Estimated: 33 mL/min — ABNORMAL LOW (ref >60)
Glucose, Bld: 256 mg/dL — ABNORMAL HIGH (ref 70–99)
Potassium: 4.3 mmol/L (ref 3.5–5.1)
Sodium: 138 mmol/L (ref 135–145)
Total Bilirubin: 0.4 mg/dL (ref 0.3–1.2)
Total Protein: 7 g/dL (ref 6.5–8.1)

## 2020-09-18 LAB — TROPONIN I (HIGH SENSITIVITY)
Troponin I (High Sensitivity): 18 ng/L — ABNORMAL HIGH (ref <18)
Troponin I (High Sensitivity): 18 ng/L — ABNORMAL HIGH (ref <18)

## 2020-09-18 MED ORDER — SODIUM CHLORIDE 0.9 % IV BOLUS
500.0000 mL | Freq: Once | INTRAVENOUS | Status: AC
  Administered 2020-09-18: 500 mL via INTRAVENOUS

## 2020-09-18 MED ORDER — GUAIFENESIN-CODEINE 100-10 MG/5ML PO SOLN
5.0000 mL | Freq: Four times a day (QID) | ORAL | 0 refills | Status: DC | PRN
Start: 2020-09-18 — End: 2020-10-08

## 2020-09-18 MED ORDER — MORPHINE SULFATE (PF) 4 MG/ML IV SOLN
4.0000 mg | Freq: Once | INTRAVENOUS | Status: AC
  Administered 2020-09-18: 4 mg via INTRAVENOUS
  Filled 2020-09-18: qty 1

## 2020-09-18 MED ORDER — ONDANSETRON HCL 4 MG/2ML IJ SOLN
4.0000 mg | Freq: Once | INTRAMUSCULAR | Status: AC
  Administered 2020-09-18: 4 mg via INTRAVENOUS
  Filled 2020-09-18: qty 2

## 2020-09-18 MED ORDER — AZITHROMYCIN 250 MG PO TABS: ORAL_TABLET | ORAL | 0 refills | Status: AC

## 2020-09-18 NOTE — ED Provider Notes (Signed)
[facility] Emergency Department Provider Note  Time seen: 12:43 PM  I have reviewed the triage vital signs and the nursing notes.   HISTORY  Chief Complaint Shortness of Breath   HPI Larry Callahan is a 65 y.o. male with a past medical history of CAD, diabetes, CHF, gastric reflux, hypertension, hyperlipidemia who presents to the emergency department for cough with sputum production mild shortness of breath.  Patient states since this morning he has been coughing with mucus production feeling somewhat short of breath due to the cough.  States mild chest tightness which she relates to the cough.  Denies any hemoptysis.  No fever.  Patient states his blood sugars also been running high today and he is feeling a little dizzy.   Past Medical History:  Diagnosis Date  . Allergy   . Coronary artery disease   . Diabetes mellitus without complication ([place])   . Diastolic CHF ([place])   . GERD (gastroesophageal reflux disease)   . Hyperlipidemia   . Hypertension   . Renal insufficiency   . Sleep apnea     Patient Active Problem List   Diagnosis Date Noted  . Gastroesophageal reflux disease without esophagitis 04/19/2020  . Pneumonia due to COVID-19 virus 01/30/2020  . Elevated troponin 01/29/2020  . Suspected COVID-19 virus infection 01/29/2020  . Postural dizziness with presyncope 01/29/2020  . Hematemesis 01/09/2020  . Hospital discharge follow-up 11/26/2019  . Cellulitis of left hand 09/26/2019  . Generalized weakness 08/08/2019  . BMI 35.0-35.9,adult 08/08/2019  . Acute right hip pain 06/17/2019  . Inability to ambulate due to hip 06/17/2019  . CKD stage 3 due to type 2 diabetes mellitus ([place])   . Hypervolemia   . Acute on chronic diastolic CHF (congestive heart failure) ([place])   . Type 2 diabetes mellitus with stage 3b chronic kidney disease, with long-term current use of insulin ([place])   . Hypomagnesemia   . Full code status 05/20/2019  .  Pleuritic chest pain   . Multifocal pneumonia 05/19/2019  . Acute kidney injury superimposed on CKD 3b ([place]) 05/09/2019  . Hemoptysis 04/09/2019  . Shortness of breath 03/26/2019  . Uncontrolled type 2 diabetes mellitus with insulin therapy ([place]) 03/12/2019  . Acute on chronic heart failure with preserved ejection fraction (HFpEF) ([place])   . Chronic kidney disease, stage 3b ([place])   . Chronic systolic CHF (congestive heart failure) ([place]) 03/01/2019  . Diastolic dysfunction 03/00/9233  . Iron deficiency anemia 12/31/2018  . Atopic dermatitis 11/24/2018  . Screening for colon cancer 11/06/2017  . Uncontrolled type 2 diabetes mellitus with hyperglycemia ([place]) 11/06/2017  . Hidradenitis 06/09/2017  . Atherosclerotic heart disease of native coronary artery without angina pectoris 06/09/2017  . 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 Obstructive sleep apnea, adult 06/09/2017  . Morbid (severe) obesity due to excess calories ([place]) 06/09/2017  . Cigarette nicotine dependence with nicotine-induced disorder 06/09/2017  . Diabetic polyneuropathy associated with type 2 diabetes mellitus ([place]) 06/09/2017  . Allergic rhinitis due to pollen 06/09/2017  . Mixed hyperlipidemia 06/09/2017  . COPD (chronic obstructive pulmonary disease) ([place]) 06/09/2017  . Dyspnea on exertion 06/09/2017  . Wheezing 06/09/2017  . Dysuria 06/09/2017  . Snoring 06/09/2017  . Essential hypertension 06/09/2017  . Personal history of other malignant neoplasm of kidney 06/09/2017  . Pain in right hip 06/09/2017  . Impacted cerumen, bilateral 06/09/2017  . Secondary hyperparathyroidism, not elsewhere classified ([place]) 06/09/2017  . Type 2 diabetes mellitus  with hyperglycemia ([place]) 06/09/2017  . Tinea corporis 06/09/2017    Past Surgical History:  Procedure Laterality Date  . BACK SURGERY    . CARDIAC CATHETERIZATION    . CHOLECYSTECTOMY    . COLONOSCOPY WITH PROPOFOL N/A 04/12/2019    Procedure: COLONOSCOPY WITH PROPOFOL;  Surgeon: [name], MD;  Location: [facility] ENDOSCOPY;  Service: Gastroenterology;  Laterality: N/A;  . ESOPHAGOGASTRODUODENOSCOPY N/A 04/12/2019   Procedure: ESOPHAGOGASTRODUODENOSCOPY (EGD);  Surgeon: [name], MD;  Location: [facility] ENDOSCOPY;  Service: Gastroenterology;  Laterality: N/A;  . ESOPHAGOGASTRODUODENOSCOPY (EGD) WITH PROPOFOL N/A 11/21/2019   Procedure: ESOPHAGOGASTRODUODENOSCOPY (EGD) WITH PROPOFOL;  Surgeon: [name], MD;  Location: [facility] ENDOSCOPY;  Service: Gastroenterology;  Laterality: N/A;  . FLEXIBLE BRONCHOSCOPY Bilateral 05/17/2019   Procedure: FLEXIBLE BRONCHOSCOPY;  Surgeon: [name], MD;  Location: [name] ORS;  Service: Pulmonary;  Laterality: Bilateral;  . left arm surgery    . nephrectomy Left   . PARATHYROIDECTOMY    . RIGHT HEART CATH N/A 03/29/2019   Procedure: RIGHT HEART CATH;  Surgeon: [name], MD;  Location: [place] CV LAB;  Service: Cardiovascular;  Laterality: N/A;    Prior to Admission medications   Medication Sig Start Date End Date Taking? Authorizing Provider  acetaminophen (TYLENOL) 325 MG tablet Take 2 tablets (650 mg total) by mouth every 4 (four) hours as needed for headache or mild pain. 03/05/19   Nicholes Mango, MD  albuterol (VENTOLIN HFA) 108 (90 Base) MCG/ACT inhaler Inhale 2 puffs into the lungs every 6 (six) hours as needed for wheezing or shortness of breath. 08/31/20   Lavera Guise, MD  ascorbic acid (VITAMIN C) 500 MG tablet Take 1 tablet (500 mg total) by mouth daily. 02/04/20   Oswald Hillock, MD  Blood Glucose Calibration (TRUE METRIX LEVEL 1) Low SOLN Use as directed 04/04/18   [name], NP  Blood Glucose Monitoring Suppl (TRUE METRIX AIR GLUCOSE METER) DEVI 1 Device by Does not apply route 2 (two) times daily. 04/13/18   [name], NP  budesonide-formoterol (SYMBICORT) 160-4.5 MCG/ACT inhaler Inhale 2 puffs into the lungs 2 (two) times daily.  07/20/20   Lavera Guise, MD  escitalopram Loma Sousa) 10 MG tablet Take one tab po qd for anxiety with food 07/17/20   Lavera Guise, MD  ferrous gluconate (FERGON) 324 MG tablet Take 1 tablet (324 mg total) by mouth daily with breakfast. 08/01/19   [name], NP  furosemide (LASIX) 40 MG tablet Take one to tabs once or twice days for fluid // swelling 07/17/20   Lavera Guise, MD  gabapentin (NEURONTIN) 100 MG capsule Take 1 capsule (100 mg total) by mouth in the morning, at noon, and at bedtime. 07/17/20   Lavera Guise, MD  glimepiride Jari Sportsman) 2 MG tablet Take one tab po qd with supper 07/17/20   Lavera Guise, MD  glucose blood (TRUE METRIX BLOOD GLUCOSE TEST) test strip Use as instructed twice daily diag E11.65 07/30/19   [name], NP  hydrALAZINE (APRESOLINE) 100 MG tablet Take 1 tablet (100 mg total) by mouth 3 (three) times daily. 07/17/20   Lavera Guise, MD  insulin glargine, 1 Unit Dial, (TOUJEO) 300 UNIT/ML Solostar Pen Inject 26 Units into the skin daily. 04/30/20   [name], NP  Insulin Pen Needle 32G X 6 MM MISC Use as directed 03/25/20   [name], NP  ipratropium-albuterol (DUONEB) 0.5-2.5 (3) MG/3ML SOLN Take  3 mLs by nebulization every 6 (six) hours as needed. 03/08/19   Kendell Bane, NP  isosorbide mononitrate (IMDUR) 30 MG 24 hr tablet Take 1 tablet (30 mg total) by mouth daily. 07/17/20   Lavera Guise, MD  metoprolol succinate (TOPROL-XL) 50 MG 24 hr tablet Take 1 tablet (50 mg total) by mouth at bedtime. Take with or immediately following a meal. 07/17/20   Lavera Guise, MD  nitroGLYCERIN (NITROSTAT) 0.4 MG SL tablet Place 1 tablet (0.4 mg total) under the tongue every 5 (five) minutes x 3 doses as needed for chest pain. 05/24/19   Loletha Grayer, MD  omeprazole (PRILOSEC) 40 MG capsule Take 1 capsule (40 mg total) by mouth in the morning and at bedtime. 07/17/20   Lavera Guise, MD  potassium chloride SA (KLOR-CON) 20 MEQ tablet Take 1 tablet (20 mEq total)  by mouth 2 (two) times daily. 07/17/20   Lavera Guise, MD  rosuvastatin (CRESTOR) 10 MG tablet Take 1 tablet (10 mg total) by mouth daily. 07/17/20 10/15/20  Lavera Guise, MD  tiotropium (SPIRIVA HANDIHALER) 18 MCG inhalation capsule Place 1 capsule (18 mcg total) into inhaler and inhale daily. 06/02/20 08/31/20  [name], NP  TRUEplus Lancets 33G MISC Use as directed twice daily diag E11.65 07/30/19   [name], NP  vitamin B-12 (CYANOCOBALAMIN) 1000 MCG tablet Take 1 tablet (1,000 mcg total) by mouth daily. 11/21/19   Enzo Bi, MD  zinc sulfate 220 (50 Zn) MG capsule Take 1 capsule (220 mg total) by mouth daily. 02/04/20   Oswald Hillock, MD    Allergies  Allergen Reactions  . Penicillins Rash    Family History  Problem Relation Age of Onset  . Diabetes Mother        2003  . Lung cancer Father   . Diabetes Sister   . Hypertension Sister   . Heart disease Sister   . Cancer Sister   . Bone cancer Brother     Social History Social History   Tobacco Use  . Smoking status: Former Smoker    Types: Cigarettes  . Smokeless tobacco: Former Systems developer    Types: Secondary school teacher  . Vaping Use: Never used  Substance Use Topics  . Alcohol use: No  . Drug use: No    Review of Systems Constitutional: Negative for fever. Cardiovascular: Mild chest tightness Respiratory: Mild shortness of breath.  Positive for cough and sputum production this morning. Gastrointestinal: Negative for abdominal pain, vomiting  Musculoskeletal: Negative for musculoskeletal complaints Neurological: Negative for headache All other ROS negative  ____________________________________________   PHYSICAL EXAM:  VITAL SIGNS: ED Triage Vitals  Enc Vitals Group     BP 09/18/20 1218 (!) 170/84     Pulse Rate 09/18/20 1218 67     Resp --      Temp 09/18/20 1218 (!) 97.5 F (36.4 C)     Temp Source 09/18/20 1218 Oral     SpO2 09/18/20 1217 95 %     Weight 09/18/20 1222 272 lb 14.9 oz (123.8 kg)      Height 09/18/20 1222 _0  (1.803 m)     Head Circumference --      Peak Flow --      Pain Score 09/18/20 1220 7     Pain Loc --      Pain Edu? --      Excl. in [place]? --     Constitutional: Alert and  oriented. Well appearing and in no distress. Eyes: Normal exam ENT      Head: Normocephalic and atraumatic.      Mouth/Throat: Mucous membranes are moist. Cardiovascular: Normal rate, regular rhythm.  Respiratory: Normal respiratory effort without tachypnea nor retractions. Breath sounds are clear  Gastrointestinal: Soft and nontender. No distention.   Musculoskeletal: Nontender with normal range of motion in all extremities.  Neurologic:  Normal speech and language. No gross focal neurologic deficits  Skin:  Skin is warm, dry and intact.  Psychiatric: Mood and affect are normal.   ____________________________________________    EKG  EKG viewed and interpreted by myself shows a normal sinus rhythm at 70 bpm with a narrow QRS, normal axis, normal intervals, nonspecific ST changes  ____________________________________________    RADIOLOGY  IMPRESSION:  Stable appearance of the chest with mild vascular crowding and  bibasilar atelectasis. No acute findings.   ____________________________________________   INITIAL IMPRESSION / ASSESSMENT AND PLAN / ED COURSE  Pertinent labs & imaging results that were available during my care of the patient were reviewed by me and considered in my medical decision making (see chart for details).   Patient presents emergency department for cough with mucus production and chest tightness this morning as well as elevated blood sugars and feeling somewhat dizzy.  We will check labs including cardiac enzymes to obtain a chest x-ray we will treat with 500 cc of IV fluids while waiting results.  Differential would include ACS, pneumonia, pneumothorax, metabolic or electrolyte abnormality, dehydration.  Work-up essentially reassuring occluding troponin  unchanged.  Chest x-ray negative.  EKG is reassuring.  The patient's complaint of cough with mucus production we will discharge with a course of Zithromax and cough medication cover for possible acute bronchitis.  Patient will follow up with his doctor.  Braylon Grenda Middlesworth was evaluated in Emergency Department on 09/18/2020 for the symptoms described in the history of present illness. He was evaluated in the context of the global COVID-19 pandemic, which necessitated consideration that the patient might be at risk for infection with the SARS-CoV-2 virus that causes COVID-19. Institutional protocols and algorithms that pertain to the evaluation of patients at risk for COVID-19 are in a state of rapid change based on information released by regulatory bodies including the CDC and federal and state organizations. These policies and algorithms were followed during the patient's care in the ED.  ____________________________________________   FINAL CLINICAL IMPRESSION(S) / ED DIAGNOSES  Chest pain Shortness of breath Cough Dizziness   Harvest Dark, MD 09/18/20 1527

## 2020-09-18 NOTE — ED Triage Notes (Signed)
Patient arrived via EMS for shortness of breath that's been going on for about 12 hours. States he is now having some chest wall pain, but he feels like it's due to his coughing. Hx of COPD.

## 2020-09-20 DIAGNOSIS — I5043 Acute on chronic combined systolic (congestive) and diastolic (congestive) heart failure: Secondary | ICD-10-CM | POA: Diagnosis not present

## 2020-09-25 ENCOUNTER — Encounter: Payer: Self-pay | Admitting: Intensive Care

## 2020-09-25 ENCOUNTER — Other Ambulatory Visit: Payer: Self-pay

## 2020-09-25 ENCOUNTER — Observation Stay
Admission: EM | Admit: 2020-09-25 | Discharge: 2020-09-26 | Disposition: A | Discharge status: Home / Self Care | Payer: Medicare PPO | Attending: Family Medicine | Admitting: Family Medicine

## 2020-09-25 ENCOUNTER — Emergency Department: Payer: Medicare PPO

## 2020-09-25 DIAGNOSIS — R1310 Dysphagia, unspecified: Secondary | ICD-10-CM

## 2020-09-25 DIAGNOSIS — I10 Essential (primary) hypertension: Secondary | ICD-10-CM | POA: Diagnosis not present

## 2020-09-25 DIAGNOSIS — E669 Obesity, unspecified: Secondary | ICD-10-CM | POA: Diagnosis not present

## 2020-09-25 DIAGNOSIS — Z794 Long term (current) use of insulin: Secondary | ICD-10-CM | POA: Insufficient documentation

## 2020-09-25 DIAGNOSIS — Z20822 Contact with and (suspected) exposure to covid-19: Secondary | ICD-10-CM | POA: Diagnosis not present

## 2020-09-25 DIAGNOSIS — E1165 Type 2 diabetes mellitus with hyperglycemia: Secondary | ICD-10-CM | POA: Diagnosis not present

## 2020-09-25 DIAGNOSIS — E1122 Type 2 diabetes mellitus with diabetic chronic kidney disease: Secondary | ICD-10-CM | POA: Diagnosis not present

## 2020-09-25 DIAGNOSIS — I130 Hypertensive heart and chronic kidney disease with heart failure and stage 1 through stage 4 chronic kidney disease, or unspecified chronic kidney disease: Secondary | ICD-10-CM | POA: Diagnosis not present

## 2020-09-25 DIAGNOSIS — Y900 Blood alcohol level of less than 20 mg/100 ml: Secondary | ICD-10-CM | POA: Diagnosis not present

## 2020-09-25 DIAGNOSIS — Z87891 Personal history of nicotine dependence: Secondary | ICD-10-CM | POA: Diagnosis not present

## 2020-09-25 DIAGNOSIS — I639 Cerebral infarction, unspecified: Secondary | ICD-10-CM | POA: Diagnosis not present

## 2020-09-25 DIAGNOSIS — J449 Chronic obstructive pulmonary disease, unspecified: Secondary | ICD-10-CM | POA: Diagnosis not present

## 2020-09-25 DIAGNOSIS — I2510 Atherosclerotic heart disease of native coronary artery without angina pectoris: Secondary | ICD-10-CM | POA: Diagnosis not present

## 2020-09-25 DIAGNOSIS — N184 Chronic kidney disease, stage 4 (severe): Secondary | ICD-10-CM | POA: Diagnosis present

## 2020-09-25 DIAGNOSIS — Z7984 Long term (current) use of oral hypoglycemic drugs: Secondary | ICD-10-CM | POA: Insufficient documentation

## 2020-09-25 DIAGNOSIS — Z79899 Other long term (current) drug therapy: Secondary | ICD-10-CM | POA: Insufficient documentation

## 2020-09-25 DIAGNOSIS — N1832 Chronic kidney disease, stage 3b: Secondary | ICD-10-CM | POA: Insufficient documentation

## 2020-09-25 DIAGNOSIS — J3489 Other specified disorders of nose and nasal sinuses: Secondary | ICD-10-CM | POA: Diagnosis not present

## 2020-09-25 DIAGNOSIS — Z8673 Personal history of transient ischemic attack (TIA), and cerebral infarction without residual deficits: Secondary | ICD-10-CM

## 2020-09-25 DIAGNOSIS — R200 Anesthesia of skin: Secondary | ICD-10-CM

## 2020-09-25 DIAGNOSIS — N183 Chronic kidney disease, stage 3 unspecified: Secondary | ICD-10-CM | POA: Diagnosis not present

## 2020-09-25 DIAGNOSIS — R0902 Hypoxemia: Secondary | ICD-10-CM | POA: Diagnosis not present

## 2020-09-25 DIAGNOSIS — I5033 Acute on chronic diastolic (congestive) heart failure: Secondary | ICD-10-CM | POA: Insufficient documentation

## 2020-09-25 DIAGNOSIS — I5022 Chronic systolic (congestive) heart failure: Secondary | ICD-10-CM | POA: Diagnosis not present

## 2020-09-25 DIAGNOSIS — G459 Transient cerebral ischemic attack, unspecified: Principal | ICD-10-CM | POA: Insufficient documentation

## 2020-09-25 DIAGNOSIS — R29818 Other symptoms and signs involving the nervous system: Secondary | ICD-10-CM | POA: Diagnosis not present

## 2020-09-25 DIAGNOSIS — R070 Pain in throat: Secondary | ICD-10-CM | POA: Diagnosis not present

## 2020-09-25 DIAGNOSIS — J329 Chronic sinusitis, unspecified: Secondary | ICD-10-CM | POA: Diagnosis not present

## 2020-09-25 HISTORY — DX: Personal history of transient ischemic attack (TIA), and cerebral infarction without residual deficits: Z86.73

## 2020-09-25 LAB — RESP PANEL BY RT-PCR (FLU A&B, COVID) ARPGX2
Influenza A by PCR: NEGATIVE
Influenza B by PCR: NEGATIVE
SARS Coronavirus 2 by RT PCR: NEGATIVE

## 2020-09-25 LAB — COMPREHENSIVE METABOLIC PANEL
ALT: 17 U/L (ref 0–44)
AST: 22 U/L (ref 15–41)
Albumin: 3.2 g/dL — ABNORMAL LOW (ref 3.5–5.0)
Alkaline Phosphatase: 80 U/L (ref 38–126)
Anion gap: 10 (ref 5–15)
BUN: 44 mg/dL — ABNORMAL HIGH (ref 8–23)
CO2: 20 mmol/L — ABNORMAL LOW (ref 22–32)
Calcium: 8.7 mg/dL — ABNORMAL LOW (ref 8.9–10.3)
Chloride: 109 mmol/L (ref 98–111)
Creatinine, Ser: 2.49 mg/dL — ABNORMAL HIGH (ref 0.61–1.24)
GFR, Estimated: 28 mL/min — ABNORMAL LOW (ref >60)
Glucose, Bld: 233 mg/dL — ABNORMAL HIGH (ref 70–99)
Potassium: 4.2 mmol/L (ref 3.5–5.1)
Sodium: 139 mmol/L (ref 135–145)
Total Bilirubin: 0.4 mg/dL (ref 0.3–1.2)
Total Protein: 7.1 g/dL (ref 6.5–8.1)

## 2020-09-25 LAB — DIFFERENTIAL
Abs Immature Granulocytes: 0.04 10*3/uL (ref 0.00–0.07)
Basophils Absolute: 0.1 10*3/uL (ref 0.0–0.1)
Basophils Relative: 1 %
Eosinophils Absolute: 0.1 10*3/uL (ref 0.0–0.5)
Eosinophils Relative: 2 %
Immature Granulocytes: 1 %
Lymphocytes Relative: 24 %
Lymphs Abs: 1.5 10*3/uL (ref 0.7–4.0)
Monocytes Absolute: 0.5 10*3/uL (ref 0.1–1.0)
Monocytes Relative: 8 %
Neutro Abs: 4.2 10*3/uL (ref 1.7–7.7)
Neutrophils Relative %: 64 %

## 2020-09-25 LAB — URINALYSIS, ROUTINE W REFLEX MICROSCOPIC
Bacteria, UA: NONE SEEN
Bilirubin Urine: NEGATIVE
Glucose, UA: 150 mg/dL — AB
Hgb urine dipstick: NEGATIVE
Ketones, ur: NEGATIVE mg/dL
Leukocytes,Ua: NEGATIVE
Nitrite: NEGATIVE
Protein, ur: >=300 mg/dL — AB
Specific Gravity, Urine: 1.012 (ref 1.005–1.030)
Squamous Epithelial / HPF: NONE SEEN (ref 0–5)
pH: 5 (ref 5.0–8.0)

## 2020-09-25 LAB — URINE DRUG SCREEN, QUALITATIVE (ARMC ONLY)
Amphetamines, Ur Screen: NOT DETECTED
Barbiturates, Ur Screen: NOT DETECTED
Benzodiazepine, Ur Scrn: NOT DETECTED
Cannabinoid 50 Ng, Ur ~~LOC~~: NOT DETECTED
Cocaine Metabolite,Ur ~~LOC~~: NOT DETECTED
MDMA (Ecstasy)Ur Screen: NOT DETECTED
Methadone Scn, Ur: NOT DETECTED
Opiate, Ur Screen: NOT DETECTED
Phencyclidine (PCP) Ur S: NOT DETECTED
Tricyclic, Ur Screen: NOT DETECTED

## 2020-09-25 LAB — CBC
HCT: 34.9 % — ABNORMAL LOW (ref 39.0–52.0)
Hemoglobin: 11.2 g/dL — ABNORMAL LOW (ref 13.0–17.0)
MCH: 28 pg (ref 26.0–34.0)
MCHC: 32.1 g/dL (ref 30.0–36.0)
MCV: 87.3 fL (ref 80.0–100.0)
Platelets: 225 10*3/uL (ref 150–400)
RBC: 4 MIL/uL — ABNORMAL LOW (ref 4.22–5.81)
RDW: 13.2 % (ref 11.5–15.5)
WBC: 6.5 10*3/uL (ref 4.0–10.5)
nRBC: 0 % (ref 0.0–0.2)

## 2020-09-25 LAB — GLUCOSE, CAPILLARY
Glucose-Capillary: 132 mg/dL — ABNORMAL HIGH (ref 70–99)
Glucose-Capillary: 127 mg/dL — ABNORMAL HIGH (ref 70–99)

## 2020-09-25 LAB — CBG MONITORING, ED: Glucose-Capillary: 249 mg/dL — ABNORMAL HIGH (ref 70–99)

## 2020-09-25 LAB — PROTIME-INR
INR: 1.1 (ref 0.8–1.2)
Prothrombin Time: 13.9 seconds (ref 11.4–15.2)

## 2020-09-25 LAB — ETHANOL: Alcohol, Ethyl (B): <10 mg/dL (ref <10)

## 2020-09-25 LAB — APTT: aPTT: 40 seconds — ABNORMAL HIGH (ref 24–36)

## 2020-09-25 MED ORDER — IPRATROPIUM-ALBUTEROL 0.5-2.5 (3) MG/3ML IN SOLN: 3.0000 mL | Freq: Four times a day (QID) | RESPIRATORY_TRACT | Status: DC | PRN

## 2020-09-25 MED ORDER — STROKE: EARLY STAGES OF RECOVERY BOOK: Freq: Once | Status: AC

## 2020-09-25 MED ORDER — ALBUTEROL SULFATE HFA 108 (90 BASE) MCG/ACT IN AERS
2.0000 | INHALATION_SPRAY | Freq: Four times a day (QID) | RESPIRATORY_TRACT | Status: DC | PRN
  Filled 2020-09-25: qty 6.7

## 2020-09-25 MED ORDER — ACETAMINOPHEN 160 MG/5ML PO SOLN
650.0000 mg | ORAL | Status: DC | PRN
Start: 2020-09-25 — End: 2020-09-26
  Filled 2020-09-25: qty 20.3

## 2020-09-25 MED ORDER — MOMETASONE FURO-FORMOTEROL FUM 200-5 MCG/ACT IN AERO
2.0000 | INHALATION_SPRAY | Freq: Two times a day (BID) | RESPIRATORY_TRACT | Status: DC
  Administered 2020-09-26: 09:00:00 2 via RESPIRATORY_TRACT
  Filled 2020-09-25: qty 8.8

## 2020-09-25 MED ORDER — DIPHENHYDRAMINE HCL 25 MG PO CAPS
25.0000 mg | ORAL_CAPSULE | ORAL | Status: DC | PRN
  Administered 2020-09-25: 25 mg via ORAL
  Filled 2020-09-25: qty 1

## 2020-09-25 MED ORDER — ASPIRIN 325 MG PO TABS
325.0000 mg | ORAL_TABLET | Freq: Every day | ORAL | Status: DC
  Administered 2020-09-25 – 2020-09-26 (×2): 325 mg via ORAL
  Filled 2020-09-25 (×2): qty 1

## 2020-09-25 MED ORDER — ESCITALOPRAM OXALATE 10 MG PO TABS
10.0000 mg | ORAL_TABLET | Freq: Every day | ORAL | Status: DC
  Administered 2020-09-26: 09:00:00 10 mg via ORAL
  Filled 2020-09-25: qty 1

## 2020-09-25 MED ORDER — ASPIRIN 81 MG PO CHEW
81.0000 mg | CHEWABLE_TABLET | Freq: Every day | ORAL | Status: DC
  Filled 2020-09-25: qty 1

## 2020-09-25 MED ORDER — INSULIN ASPART 100 UNIT/ML ~~LOC~~ SOLN
0.0000 [IU] | SUBCUTANEOUS | Status: DC
  Administered 2020-09-26: 2 [IU] via SUBCUTANEOUS
  Administered 2020-09-26: 8 [IU] via SUBCUTANEOUS
  Administered 2020-09-26: 3 [IU] via SUBCUTANEOUS
  Filled 2020-09-25 (×3): qty 1

## 2020-09-25 MED ORDER — ATORVASTATIN CALCIUM 20 MG PO TABS
40.0000 mg | ORAL_TABLET | Freq: Every day | ORAL | Status: DC
  Administered 2020-09-26: 09:00:00 40 mg via ORAL
  Filled 2020-09-25: qty 2

## 2020-09-25 MED ORDER — ACETAMINOPHEN 650 MG RE SUPP: 650.0000 mg | RECTAL | Status: DC | PRN

## 2020-09-25 MED ORDER — ASPIRIN 300 MG RE SUPP
300.0000 mg | Freq: Every day | RECTAL | Status: DC
  Filled 2020-09-25: qty 1

## 2020-09-25 MED ORDER — ACETAMINOPHEN 325 MG PO TABS: 650.0000 mg | ORAL_TABLET | ORAL | Status: DC | PRN

## 2020-09-25 NOTE — ED Notes (Signed)
Transport to inpatient unit requested.

## 2020-09-25 NOTE — Consult Note (Signed)
NEUROLOGY CONSULTATION NOTE   Date of service: September 25, 2020 Patient Name: Larry Callahan MRN:  [id number] DOB:  1956/03/29 Reason for consult: "Stroke code" _ _ _   _ __   _ __ _ _  __ __   _ __   __ _  History of Present Illness  Larry Callahan is a 65 y.o. male with PMH significant for CAD, DM2, GERD, HTN, HLD, OSA, COPD, smoker in the past and quit who presents with R sided numbness. Reports that symptoms started suddenly today at 1110. Sensation is decreased in R face, R arm and R leg with no incoordination, no weakness.  No prior hx of stroke, does not take aspirin at home.   MRS: 0 NIHSS: 2 TPA: I discussed with patient, symptoms are mild with only numbness but no weakness. I did discuss the risks and benefits of tPA including 30% chance of improvement and about 6% chance of ICH, given that his symptoms are not disabling, opted not to go with tPA and I agree. Thrombectomy: No, symptoms are mild.  NIHSS components Score: Comment  1a Level of Conscious 0_0  1_1  2_2  3_3      1b LOC Questions 0_4  1_5  2_6       1c LOC Commands 0_7  1_8  2_9       2 Best Gaze 0_10  1_11  2_12       3 Visual 0_13  1_14  2_15  3_16      4 Facial Palsy 0_17  1_18  2_19  3_20      5a Motor Arm - left 0_21  1_22  2_23  3_24  4_25  UN_26    5b Motor Arm - Right 0_27  1_28  2_29  3_30  4_31  UN_32    6a Motor Leg - Left 0_33  1_34  2_35  3_36  4_37  UN_38    6b Motor Leg - Right 0_39  1_40  2_41  3_42  4_43  UN_44    7 Limb Ataxia 0_45  1_46  2_47  3_48  UN_49     8 Sensory 0_50  1_51  2_52  UN_53      9 Best Language 0_54  1_55  2_56  3_57      10 Dysarthria 0_58  1_59  2_60  UN_61      11 Extinct. and Inattention 0_62  1_63  2_64       TOTAL: 2       ROS   Constitutional Denies weight loss, fever and chills.   HEENT Denies changes in vision and hearing.   Respiratory Denies SOB and cough.   CV Denies palpitations and CP   GI Denies abdominal pain, nausea, vomiting and diarrhea.   GU Denies dysuria and urinary frequency.   MSK Denies myalgia and joint pain.   Skin Denies  rash and pruritus.   Neurological Denies headache and syncope.   Psychiatric Denies recent changes in mood. Denies anxiety and depression.   Past History   Past Medical History:  Diagnosis Date  . Allergy   . Coronary artery disease   . Diabetes mellitus without complication ([place])   . Diastolic CHF ([place])   . GERD (gastroesophageal reflux disease)   . Hyperlipidemia   . Hypertension   . Renal insufficiency   . Sleep apnea    Past Surgical History:  Procedure Laterality Date  . BACK SURGERY    . CARDIAC CATHETERIZATION    . CHOLECYSTECTOMY    . COLONOSCOPY WITH PROPOFOL N/A 04/12/2019   Procedure: COLONOSCOPY WITH PROPOFOL;  Surgeon: [name], MD;  Location[facility] ENDOSCOPY;  Service: Gastroenterology;  Laterality: N/A;  . ESOPHAGOGASTRODUODENOSCOPY N/A 04/12/2019   Procedure: ESOPHAGOGASTRODUODENOSCOPY (EGD);  Surgeon: [name], MD;  Location: [facility] ENDOSCOPY;  Service: Gastroenterology;  Laterality: N/A;  .  ESOPHAGOGASTRODUODENOSCOPY (EGD) WITH PROPOFOL N/A 11/21/2019   Procedure: ESOPHAGOGASTRODUODENOSCOPY (EGD) WITH PROPOFOL;  Surgeon: [name], MD;  Location: [place];  Service: Gastroenterology;  Laterality: N/A;  . FLEXIBLE BRONCHOSCOPY Bilateral 05/17/2019   Procedure: FLEXIBLE BRONCHOSCOPY;  Surgeon: [name], MD;  Location: [name] ORS;  Service: Pulmonary;  Laterality: Bilateral;  . left arm surgery    . nephrectomy Left   . PARATHYROIDECTOMY    . RIGHT HEART CATH N/A 03/29/2019   Procedure: RIGHT HEART CATH;  Surgeon: [name], MD;  Location: [place] CV LAB;  Service: Cardiovascular;  Laterality: N/A;   Family History  Problem Relation Age of Onset  . Diabetes Mother        2003  . Lung cancer Father   . Diabetes Sister   . Hypertension Sister   . Heart disease Sister   . Cancer Sister   . Bone cancer Brother    Social History   Socioeconomic History  . Marital status: Single    Spouse name: Not on file   . Number of children: Not on file  . Years of education: Not on file  . Highest education level: Not on file  Occupational History  . Not on file  Tobacco Use  . Smoking status: Former Smoker    Types: Cigarettes  . Smokeless tobacco: Former Systems developer    Types: Secondary school teacher  . Vaping Use: Never used  Substance and Sexual Activity  . Alcohol use: No  . Drug use: No  . Sexual activity: Not on file  Other Topics Concern  . Not on file  Social History Narrative  . Not on file   Social Determinants of Health   Financial Resource Strain: Not on file  Food Insecurity: Not on file  Transportation Needs: Not on file  Physical Activity: Not on file  Stress: Not on file  Social Connections: Not on file   Allergies  Allergen Reactions  . Penicillins Rash    Medications  (Not in a hospital admission)    Vitals   Vitals:   09/25/20 1402 09/25/20 1403  BP: (!) 158/89   Pulse: 78   Resp: 18   Temp: 98.3 F (36.8 C)   TempSrc: Oral   SpO2: 95%   Weight:  123.8 kg  Height:  _0  (1.803 m)     Body mass index is 38.07 kg/m.  Physical Exam   General: Laying comfortably in bed; in no acute distress.  HENT: Normal oropharynx and mucosa. Normal external appearance of ears and nose.  Neck: Supple, no pain or tenderness  CV: No JVD. No peripheral edema.  Pulmonary: Symmetric Chest rise. Normal respiratory effort.  Abdomen: Soft to touch, non-tender.  Ext: No cyanosis, edema, or deformity  Skin: No rash. Normal palpation of skin.   Musculoskeletal: Normal digits and nails by inspection. No clubbing.   Neurologic Examination  Mental status/Cognition: Alert, oriented to self, place, month and year, good attention.  Speech/language: Fluent, comprehension intact, object naming intact, repetition intact.  Cranial nerves:   CN II Pupils equal and reactive to light, no VF deficits    CN III,IV,VI EOM intact, no gaze preference or deviation, no nystagmus    CN V normal  sensation in V1, V2, and V3 segments bilaterally    CN VII no asymmetry, no nasolabial fold flattening    CN VIII normal hearing to speech    CN IX & X normal palatal elevation, no uvular deviation  CN XI 5/5 head turn and 5/5 shoulder shrug bilaterally    CN XII midline tongue protrusion    Motor:  Muscle bulk: normal, tone normal. Mvmt Root Nerve  Muscle Right Left Comments  SA C5/6 Ax Deltoid 5 5   EF C5/6 Mc Biceps 5 5   EE C6/7/8 Rad Triceps 5 5   WF C6/7 Med FCR     WE C7/8 PIN ECU     F Ab C8/T1 U ADM/FDI 5 5   HF L1/2/3 Fem Illopsoas 5 5   KE L2/3/4 Fem Quad 5 5   DF L4/5 D Peron Tib Ant 5 5   PF S1/2 Tibial Grc/Sol 5 5    Sensation:  Light touch Decreased to 50% in R Face, R arm and R Leg.   Pin prick    Temperature    Vibration   Proprioception    Coordination/Complex Motor:  - Finger to Nose intact BL - Heel to shin intact BL - Rapid alternating movement are normal - Gait: Deferred.  Labs   CBC:  Recent Labs  Lab 09/25/20 1412  WBC 6.5  NEUTROABS 4.2  HGB 11.2*  HCT 34.9*  MCV 87.3  PLT 384    Basic Metabolic Panel:  Lab Results  Component Value Date   NA 138 09/18/2020   K 4.3 09/18/2020   CO2 19 (L) 09/18/2020   GLUCOSE 256 (H) 09/18/2020   BUN 38 (H) 09/18/2020   CREATININE 2.12 (H) 09/18/2020   CALCIUM 9.0 09/18/2020   GFRNONAA 33 (L) 09/18/2020   GFRAA 51 (L) 02/18/2020   Lipid Panel:  Lab Results  Component Value Date   LDLCALC 98 03/02/2019   HgbA1c:  Lab Results  Component Value Date   HGBA1C 7.1 (A) 07/17/2020   Urine Drug Screen:     Component Value Date/Time   LABOPIA NONE DETECTED 08/08/2019 1657   COCAINSCRNUR NONE DETECTED 08/08/2019 1657   LABBENZ NONE DETECTED 08/08/2019 1657   AMPHETMU NONE DETECTED 08/08/2019 1657   THCU NONE DETECTED 08/08/2019 1657   LABBARB NONE DETECTED 08/08/2019 1657    Alcohol Level No results found for: [place]  CT Head without contrast: CTH was negative for a large hypodensity  concerning for a large territory infarct or hyperdensity concerning for an ICH  MRI Brain  Pending  Impression   Larry Callahan is a 66 y.o. male with PMH significant for CAD, DM2, GERD, HTN, HLD, OSA, COPD, smoker in the past and quit who presents with R sided numbness with a NIHSS of 2. No tPA due to not significantly disabling symptoms. Not a candidate for thrombectomy due to low NIHSS. Suspect this is a TIA vs minor ischemic stroke.  Recommendations  Plan:  Recommend that primary team order following: - Frequent Neuro checks per stroke unit protocol - Recommend brain imaging with MRI Brain without contrast - Recommend Vascular imaging with MRA Angio Head without contrast and US Carotid doppler - Recommend obtaining TTE - Recommend obtaining Lipid panel with LDL - Please start statin if LDL > 70 - Recommend HbA1c - Antithrombotic - aspirin 67m and plavix 741mdaily x 21 days, then aspirin 811maily alone. - Recommend DVT ppx - SBP goal - permissive hypertension first 24 h < 220/110. Held home meds.  - Recommend Telemetry monitoring for arrythmia - Recommend bedside swallow screen prior to PO intake. - Stroke education booklet - Recommend PT/OT/SLP consult - Recommend Urine Tox screen.  ______________________________________________________________________   Thank you for the  opportunity to take part in the care of this patient. If you have any further questions, please contact the neurology consultation attending.  Signed,  [place] Pager Number [phone] _ _ _   _ __   _ __ _ _  __ __   _ __   __ _

## 2020-09-25 NOTE — ED Notes (Signed)
Stroke swallow screen failed. SLP order placed verbal order Dr Cherylann Banas

## 2020-09-25 NOTE — ED Notes (Signed)
ED Provider at bedside. 

## 2020-09-25 NOTE — ED Triage Notes (Addendum)
Patient c/o numbness in lips and down right arm to hand. Also reports unable to swallow liquid when numbness started. Bilateral hand grips strong and equal. HX diabetes. Patient able to speak in complete sentences. No facial droop noted

## 2020-09-25 NOTE — ED Provider Notes (Signed)
[facility] Emergency Department Provider Note ____________________________________________   Event Date/Time   First MD Initiated Contact with Patient 09/25/20 1404     (approximate)  I have reviewed the triage vital signs and the nursing notes.   HISTORY  Chief Complaint Numbness and Dysphagia    HPI Larry Callahan is a 65 y.o. male with PMH as noted below including CAD, CHF, diabetes, hypertension, and GERD who presents with acute onset of right-sided and facial numbness around 11 or 1130 this morning.  He states he had just eaten.  He started to have a pain in his throat, cramping in his right arm, and then developed the numbness in his face and perioral area, tongue, right arm, and proximal right leg.  He denies any prior history of these symptoms.  He states that the cramping has resolved but he is still numb in these areas.  He states that he has some difficulty swallowing.  He denies any headache or vomiting.  Past Medical History:  Diagnosis Date  . Allergy   . Coronary artery disease   . Diabetes mellitus without complication ([place])   . Diastolic CHF ([place])   . GERD (gastroesophageal reflux disease)   . Hyperlipidemia   . Hypertension   . Renal insufficiency   . Sleep apnea     Patient Active Problem List   Diagnosis Date Noted  . CVA (cerebral vascular accident) ([place]) 09/25/2020  . Gastroesophageal reflux disease without esophagitis 04/19/2020  . Pneumonia due to COVID-19 virus 01/30/2020  . Elevated troponin 01/29/2020  . Suspected COVID-19 virus infection 01/29/2020  . Postural dizziness with presyncope 01/29/2020  . Hematemesis 01/09/2020  . Hospital discharge follow-up 11/26/2019  . Cellulitis of left hand 09/26/2019  . Generalized weakness 08/08/2019  . Obesity (BMI 30-39.9) 08/08/2019  . Acute right hip pain 06/17/2019  . Inability to ambulate due to hip 06/17/2019  . CKD stage 3 due to type 2 diabetes mellitus ([place])    . Hypervolemia   . Acute on chronic diastolic CHF (congestive heart failure) ([place])   . Type 2 diabetes mellitus with stage 3b chronic kidney disease, with long-term current use of insulin ([place])   . Hypomagnesemia   . Full code status 05/20/2019  . Pleuritic chest pain   . Multifocal pneumonia 05/19/2019  . Acute kidney injury superimposed on CKD 3b ([place]) 05/09/2019  . Hemoptysis 04/09/2019  . Shortness of breath 03/26/2019  . Uncontrolled type 2 diabetes mellitus with insulin therapy ([place]) 03/12/2019  . Acute on chronic heart failure with preserved ejection fraction (HFpEF) ([place])   . Chronic kidney disease, stage 3b ([place])   . Chronic systolic CHF (congestive heart failure) ([place]) 03/01/2019  . Diastolic dysfunction 00/92/3300  . Iron deficiency anemia 12/31/2018  . Atopic dermatitis 11/24/2018  . Screening for colon cancer 11/06/2017  . Uncontrolled type 2 diabetes mellitus with hyperglycemia ([place]) 11/06/2017  . Hidradenitis 06/09/2017  . Atherosclerotic heart disease of native coronary artery without angina pectoris 06/09/2017  . 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 Obstructive sleep apnea, adult 06/09/2017  . Morbid (severe) obesity due to excess calories ([place]) 06/09/2017  . Cigarette nicotine dependence with nicotine-induced disorder 06/09/2017  . Diabetic polyneuropathy associated with type 2 diabetes mellitus ([place]) 06/09/2017  . Allergic rhinitis due to pollen 06/09/2017  . Mixed hyperlipidemia 06/09/2017  . COPD (chronic obstructive pulmonary disease) ([place]) 06/09/2017  . Dyspnea on exertion 06/09/2017  . Wheezing 06/09/2017  . Dysuria 06/09/2017  .  Snoring 06/09/2017  . Essential hypertension 06/09/2017  . Personal history of other malignant neoplasm of kidney 06/09/2017  . Pain in right hip 06/09/2017  . Impacted cerumen, bilateral 06/09/2017  . Secondary hyperparathyroidism, not elsewhere classified ([place]) 06/09/2017  . Type 2  diabetes mellitus with hyperglycemia ([place]) 06/09/2017  . Tinea corporis 06/09/2017    Past Surgical History:  Procedure Laterality Date  . BACK SURGERY    . CARDIAC CATHETERIZATION    . CHOLECYSTECTOMY    . COLONOSCOPY WITH PROPOFOL N/A 04/12/2019   Procedure: COLONOSCOPY WITH PROPOFOL;  Surgeon: [name], MD;  Location[facility] ENDOSCOPY;  Service: Gastroenterology;  Laterality: N/A;  . ESOPHAGOGASTRODUODENOSCOPY N/A 04/12/2019   Procedure: ESOPHAGOGASTRODUODENOSCOPY (EGD);  Surgeon: [name], MD;  Location: [facility] ENDOSCOPY;  Service: Gastroenterology;  Laterality: N/A;  . ESOPHAGOGASTRODUODENOSCOPY (EGD) WITH PROPOFOL N/A 11/21/2019   Procedure: ESOPHAGOGASTRODUODENOSCOPY (EGD) WITH PROPOFOL;  Surgeon: [name], MD;  Location: [facility] ENDOSCOPY;  Service: Gastroenterology;  Laterality: N/A;  . FLEXIBLE BRONCHOSCOPY Bilateral 05/17/2019   Procedure: FLEXIBLE BRONCHOSCOPY;  Surgeon: [name], MD;  Location: [name] ORS;  Service: Pulmonary;  Laterality: Bilateral;  . left arm surgery    . nephrectomy Left   . PARATHYROIDECTOMY    . RIGHT HEART CATH N/A 03/29/2019   Procedure: RIGHT HEART CATH;  Surgeon: [name], MD;  Location: [place] CV LAB;  Service: Cardiovascular;  Laterality: N/A;    Prior to Admission medications   Medication Sig Start Date End Date Taking? Authorizing Provider  albuterol (VENTOLIN HFA) 108 (90 Base) MCG/ACT inhaler Inhale 2 puffs into the lungs every 6 (six) hours as needed for wheezing or shortness of breath. 08/31/20  Yes Lavera Guise, MD  budesonide-formoterol [facility]) 160-4.5 MCG/ACT inhaler Inhale 2 puffs into the lungs 2 (two) times daily. 07/20/20  Yes Lavera Guise, MD  furosemide (LASIX) 40 MG tablet Take one to tabs once or twice days for fluid // swelling 07/17/20  Yes Lavera Guise, MD  gabapentin (NEURONTIN) 100 MG capsule Take 1 capsule (100 mg total) by mouth in the morning, at noon, and at bedtime. 07/17/20   Yes Lavera Guise, MD  gemfibrozil (LOPID) 600 MG tablet Take 600 mg by mouth 2 (two) times daily. 07/17/20  Yes [provider]  glimepiride (AMARYL) 2 MG tablet Take one tab po qd with supper 07/17/20  Yes Lavera Guise, MD  guaiFENesin-codeine 100-10 MG/5ML syrup Take 5 mLs by mouth every 6 (six) hours as needed for cough. 09/18/20  Yes Harvest Dark, MD  hydrALAZINE (APRESOLINE) 100 MG tablet Take 1 tablet (100 mg total) by mouth 3 (three) times daily. 07/17/20  Yes Lavera Guise, MD  insulin glargine, 1 Unit Dial, (TOUJEO) 300 UNIT/ML Solostar Pen Inject 26 Units into the skin daily. 04/30/20  Yes Boscia, Greer Ee, NP  isosorbide mononitrate (IMDUR) 30 MG 24 hr tablet Take 1 tablet (30 mg total) by mouth daily. 07/17/20  Yes Lavera Guise, MD  metoprolol succinate (TOPROL-XL) 50 MG 24 hr tablet Take 1 tablet (50 mg total) by mouth at bedtime. Take with or immediately following a meal. 07/17/20  Yes Lavera Guise, MD  omeprazole (PRILOSEC) 40 MG capsule Take 1 capsule (40 mg total) by mouth in the morning and at bedtime. 07/17/20  Yes Lavera Guise, MD  potassium chloride SA (KLOR-CON) 20 MEQ tablet Take 1 tablet (20 mEq total) by mouth 2 (two) times daily. 07/17/20  Yes Lavera Guise,  MD  rosuvastatin (CRESTOR) 10 MG tablet Take 1 tablet (10 mg total) by mouth daily. 07/17/20 10/15/20 Yes Lavera Guise, MD  acetaminophen (TYLENOL) 325 MG tablet Take 2 tablets (650 mg total) by mouth every 4 (four) hours as needed for headache or mild pain. Patient not taking: No sig reported 03/05/19   Nicholes Mango, MD  ascorbic acid (VITAMIN C) 500 MG tablet Take 1 tablet (500 mg total) by mouth daily. Patient not taking: No sig reported 02/04/20   Oswald Hillock, MD  Blood Glucose Calibration (TRUE METRIX LEVEL 1) Low SOLN Use as directed 04/04/18   [name], NP  Blood Glucose Monitoring Suppl (TRUE METRIX AIR GLUCOSE METER) DEVI 1 Device by Does not apply route 2 (two) times daily. 04/13/18   [name], NP  escitalopram (LEXAPRO) 10 MG tablet Take one tab po qd for anxiety with food 07/17/20   Lavera Guise, MD  ferrous gluconate (FERGON) 324 MG tablet Take 1 tablet (324 mg total) by mouth daily with breakfast. Patient not taking: No sig reported 08/01/19   [name], NP  glucose blood (TRUE METRIX BLOOD GLUCOSE TEST) test strip Use as instructed twice daily diag E11.65 07/30/19   [name], NP  Insulin Pen Needle 32G X 6 MM MISC Use as directed 03/25/20   [name], NP  ipratropium-albuterol (DUONEB) 0.5-2.5 (3) MG/3ML SOLN Take 3 mLs by nebulization every 6 (six) hours as needed. 03/08/19   Kendell Bane, NP  nitroGLYCERIN (NITROSTAT) 0.4 MG SL tablet Place 1 tablet (0.4 mg total) under the tongue every 5 (five) minutes x 3 doses as needed for chest pain. 05/24/19   Loletha Grayer, MD  tiotropium (SPIRIVA HANDIHALER) 18 MCG inhalation capsule Place 1 capsule (18 mcg total) into inhaler and inhale daily. 06/02/20 08/31/20  [name], NP  TRUEplus Lancets 33G MISC Use as directed twice daily diag E11.65 07/30/19   [name], NP  vitamin B-12 (CYANOCOBALAMIN) 1000 MCG tablet Take 1 tablet (1,000 mcg total) by mouth daily. Patient not taking: No sig reported 11/21/19   Enzo Bi, MD  zinc sulfate 220 (50 Zn) MG capsule Take 1 capsule (220 mg total) by mouth daily. Patient not taking: No sig reported 02/04/20   Oswald Hillock, MD    Allergies Penicillins  Family History  Problem Relation Age of Onset  . Diabetes Mother        2003  . Lung cancer Father   . Diabetes Sister   . Hypertension Sister   . Heart disease Sister   . Cancer Sister   . Bone cancer Brother     Social History Social History   Tobacco Use  . Smoking status: Former Smoker    Types: Cigarettes  . Smokeless tobacco: Former Systems developer    Types: Secondary school teacher  . Vaping Use: Never used  Substance Use Topics  . Alcohol use: No  . Drug use: No    Review of  Systems  Constitutional: No fever. Eyes: No visual changes. ENT: Positive for throat discomfort. Cardiovascular: Denies chest pain. Respiratory: Denies shortness of breath. Gastrointestinal: No vomiting or diarrhea. Genitourinary: Negative for dysuria.  Musculoskeletal: Negative for back pain. Skin: Negative for rash. Neurological: Negative for headache.  Positive for right-sided numbness.   ____________________________________________   PHYSICAL EXAM:  VITAL SIGNS: ED Triage Vitals  Enc Vitals Group     BP 09/25/20 1402 (!) 158/89  Pulse Rate 09/25/20 1402 78     Resp 09/25/20 1402 18     Temp 09/25/20 1402 98.3 F (36.8 C)     Temp Source 09/25/20 1402 Oral     SpO2 09/25/20 1402 95 %     Weight 09/25/20 1403 272 lb 14.9 oz (123.8 kg)     Height 09/25/20 1403 _0  (1.803 m)     Head Circumference --      Peak Flow --      Pain Score 09/25/20 1402 8     Pain Loc --      Pain Edu? --      Excl. in [place]? --     Constitutional: Alert and oriented. Well appearing and in no acute distress. Eyes: Conjunctivae are normal.  EOMI.  PERRLA. Head: Atraumatic. Nose: No congestion/rhinnorhea. Mouth/Throat: Mucous membranes are slightly dry.  Oropharynx clear with no erythema or exudate. Neck: Normal range of motion.  Cardiovascular: Normal rate, regular rhythm. Good peripheral circulation. Respiratory: Normal respiratory effort.  No retractions.  Gastrointestinal: No distention.  Musculoskeletal: Extremities warm and well perfused.  Neurologic:  Normal speech and language.  No tongue deviation.  No facial droop.  Decreased sensation to the right side of the face including forehead.  Subjective decreased sensation to the right arm and proximal right leg.  5/5 motor strength bilateral upper and lower extremities.  Normal grip strength bilaterally.  No pronator drift. Skin:  Skin is warm and dry. No rash noted. Psychiatric: Mood and affect are normal. Speech and behavior are  normal.  ____________________________________________   LABS (all labs ordered are listed, but only abnormal results are displayed)  Labs Reviewed  APTT - Abnormal; Notable for the following components:      Result Value   aPTT 40 (*)    All other components within normal limits  COMPREHENSIVE METABOLIC PANEL - Abnormal; Notable for the following components:   CO2 20 (*)    Glucose, Bld 233 (*)    BUN 44 (*)    Creatinine, Ser 2.49 (*)    Calcium 8.7 (*)    Albumin 3.2 (*)    GFR, Estimated 28 (*)    All other components within normal limits  URINALYSIS, ROUTINE W REFLEX MICROSCOPIC - Abnormal; Notable for the following components:   Color, Urine YELLOW (*)    APPearance HAZY (*)    Glucose, UA 150 (*)    Protein, ur >=300 (*)    All other components within normal limits  CBC - Abnormal; Notable for the following components:   RBC 4.00 (*)    Hemoglobin 11.2 (*)    HCT 34.9 (*)    All other components within normal limits  GLUCOSE, CAPILLARY - Abnormal; Notable for the following components:   Glucose-Capillary 132 (*)    All other components within normal limits  CBG MONITORING, ED - Abnormal; Notable for the following components:   Glucose-Capillary 249 (*)    All other components within normal limits  RESP PANEL BY RT-PCR (FLU A&B, COVID) ARPGX2  ETHANOL  PROTIME-INR  URINE DRUG SCREEN, QUALITATIVE ([name] ONLY)  DIFFERENTIAL  HIV ANTIBODY (ROUTINE TESTING W REFLEX)  HEMOGLOBIN A1C  LIPID PANEL   ____________________________________________  EKG  ED ECG REPORT I, Arta Silence, the attending physician, personally viewed and interpreted this ECG.  Date: 09/25/2020 EKG Time: 1415 Rate: 76 Rhythm: normal sinus rhythm QRS Axis: normal Intervals: normal ST/T Wave abnormalities: normal Narrative Interpretation: no evidence of acute ischemia  ____________________________________________  RADIOLOGY  CT head: No ICH or other acute  abnormality  ____________________________________________   PROCEDURES  Procedure(s) performed: No  Procedures  Critical Care performed: Yes  CRITICAL CARE Performed by: Arta Silence   Total critical care time: 30 minutes  Critical care time was exclusive of separately billable procedures and treating other patients.  Critical care was necessary to treat or prevent imminent or life-threatening deterioration.  Critical care was time spent personally by me on the following activities: development of treatment plan with patient and/or surrogate as well as nursing, discussions with consultants, evaluation of patient's response to treatment, examination of patient, obtaining history from patient or surrogate, ordering and performing treatments and interventions, ordering and review of laboratory studies, ordering and review of radiographic studies, pulse oximetry and re-evaluation of patient's condition. ____________________________________________   INITIAL IMPRESSION / ASSESSMENT AND PLAN / ED COURSE  Pertinent labs & imaging results that were available during my care of the patient were reviewed by me and considered in my medical decision making (see chart for details).  65 year old male with PMH as noted above including CAD, CHF, diabetes, and GERD presents with acute onset of right-sided and facial numbness around 11 or 1130 this morning.  He initially had some cramping to the right arm but this has resolved.  I reviewed the past medical records in [place].  The patient was seen in the ED last week with a productive cough and treated for bronchitis.  He had a similar ED visit in February, and for chest pain in January.  He has no prior history of stroke or TIA.  On exam, the patient is overall comfortable appearing.  His vital signs are normal except for mild hypertension.  Oropharynx is clear with no erythema or exudate.  He has decreased sensation to the right side of his  face and right extremities but no facial droop, ataxia, or motor deficits.  Differential includes CVA, TIA, radiculopathy, near syncope, ACS, electrode abnormality, other metabolic cause.  Given that the patient was presented within a few hours of the onset of symptoms and he does have persistent sensory deficit, I have activated code stroke.  We will obtain a CT head, code stroke neurology consult, lab work-up, and reassess.  ----------------------------------------- 4:20 PM on 09/25/2020 -----------------------------------------  The patient has had no change in his symptoms.  I consulted with Dr. Lorrin Goodell from neurology who evaluated the patient and does not recommend tPA at this time.  He does recommend admission for stroke work-up.  I consulted Dr. Francine Graven from the hospitalist service for admission.  ____________________________________________   FINAL CLINICAL IMPRESSION(S) / ED DIAGNOSES  Final diagnoses:  Right sided numbness  Dysphagia, unspecified type      NEW MEDICATIONS STARTED DURING THIS VISIT:  Current Discharge Medication List       Note:  This document was prepared using Dragon voice recognition software and may include unintentional dictation errors.   Arta Silence, MD 09/25/20 [phone]

## 2020-09-25 NOTE — Progress Notes (Signed)
Patient s blood pressure 164/78. MD MADE AWARE

## 2020-09-25 NOTE — ED Notes (Signed)
Pt to CT

## 2020-09-25 NOTE — ED Notes (Signed)
Patient transported to inpatient unit. Inpatient RN notified via chat.

## 2020-09-25 NOTE — H&P (Addendum)
History and Physical    Larry Callahan DUK:[id number] DOB: 04-18-1956 DOA: 09/25/2020  PCP: Lavera Guise, MD   Patient coming from: Home  I have personally briefly reviewed patient's old medical records in [place]  Chief Complaint: Right-sided numbness  HPI: Larry Callahan is a 65 y.o. 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  He also complains of difficulty swallowing and a headache but denies having any lower extremity weakness or incoordination. Patient's symptoms are mild and he did not receive tPA in the ER. He denies having any chest pain, no shortness of breath, no nausea, no vomiting, no diarrhea, no palpitations, no abdominal pain, no constipation, no urinary frequency, no nocturia, no dysuria, no fever, no chills, no blurred vision. Labs show sodium 139, potassium 4.2, chloride 109, bicarb 20, glucose 233, BUN 44, creatinine 2.49, calcium 8.7, alkaline phosphatase 80, albumin 3.2, AST 22, ALT 17, total protein 7.1, white count 6.5, hemoglobin 11.2, hematocrit 34.9, MCV 87.3, RDW 13.2, platelet count 225, PT 13.9, INR 1.1, Respiratory viral panel is negative Twelve-lead EKG reviewed by me shows normal sinus rhythm with LVH. CT scan of the head without contrast shows no evidence of acute intracranial abnormality.  Mild paranasal sinus disease.  ED Course: Patient is a 65 year old Caucasian male who presents to the emergency room via EMS as a code stroke for evaluation of right-sided numbness and difficulty swallowing which started around 11 AM on the day of admission.   He was seen by neurology in the emergency room was not a candidate for tPA.  He will be referred to observation status for further evaluation for an acute stroke.  Review of Systems: As per HPI otherwise all other systems reviewed and negative.    Past Medical History:  Diagnosis Date  . Allergy   . Coronary artery disease   . Diabetes mellitus without complication ([place])   . Diastolic CHF ([place])   . GERD (gastroesophageal reflux disease)   . Hyperlipidemia   . Hypertension   . Renal insufficiency   . Sleep apnea     Past Surgical History:  Procedure Laterality Date  . BACK SURGERY    . CARDIAC CATHETERIZATION    . CHOLECYSTECTOMY    . COLONOSCOPY WITH PROPOFOL N/A 04/12/2019   Procedure: COLONOSCOPY WITH PROPOFOL;  Surgeon: [name], MD;  Location[facility] ENDOSCOPY;  Service: Gastroenterology;  Laterality: N/A;  . ESOPHAGOGASTRODUODENOSCOPY N/A 04/12/2019   Procedure: ESOPHAGOGASTRODUODENOSCOPY (EGD);  Surgeon: [name], MD;  Location: [facility] ENDOSCOPY;  Service: Gastroenterology;  Laterality: N/A;  . ESOPHAGOGASTRODUODENOSCOPY (EGD) WITH PROPOFOL N/A 11/21/2019   Procedure: ESOPHAGOGASTRODUODENOSCOPY (EGD) WITH PROPOFOL;  Surgeon: [name], MD;  Location: [facility] ENDOSCOPY;  Service: Gastroenterology;  Laterality: N/A;  . FLEXIBLE BRONCHOSCOPY Bilateral 05/17/2019   Procedure: FLEXIBLE BRONCHOSCOPY;  Surgeon: [name], MD;  Location: [name] ORS;  Service: Pulmonary;  Laterality: Bilateral;  . left arm surgery    . nephrectomy Left   . PARATHYROIDECTOMY    . RIGHT HEART CATH N/A 03/29/2019   Procedure: RIGHT HEART CATH;  Surgeon: [name], MD;  Location: [facility] INVASIVE CV  LAB;  Service: Cardiovascular;  Laterality: N/A;     reports that he has quit smoking. His smoking use included cigarettes. He has quit using smokeless tobacco.  His smokeless tobacco use included chew. He reports that he does not drink alcohol and does not use  drugs.  Allergies  Allergen Reactions  . Penicillins Rash    Family History  Problem Relation Age of Onset  . Diabetes Mother        2003  . Lung cancer Father   . Diabetes Sister   . Hypertension Sister   . Heart disease Sister   . Cancer Sister   . Bone cancer Brother       Prior to Admission medications   Medication Sig Start Date End Date Taking? Authorizing Provider  acetaminophen (TYLENOL) 325 MG tablet Take 2 tablets (650 mg total) by mouth every 4 (four) hours as needed for headache or mild pain. 03/05/19   Nicholes Mango, MD  albuterol (VENTOLIN HFA) 108 (90 Base) MCG/ACT inhaler Inhale 2 puffs into the lungs every 6 (six) hours as needed for wheezing or shortness of breath. 08/31/20   Lavera Guise, MD  ascorbic acid (VITAMIN C) 500 MG tablet Take 1 tablet (500 mg total) by mouth daily. 02/04/20   Oswald Hillock, MD  Blood Glucose Calibration (TRUE METRIX LEVEL 1) Low SOLN Use as directed 04/04/18   [name], NP  Blood Glucose Monitoring Suppl (TRUE METRIX AIR GLUCOSE METER) DEVI 1 Device by Does not apply route 2 (two) times daily. 04/13/18   [name], NP  budesonide-formoterol (SYMBICORT) 160-4.5 MCG/ACT inhaler Inhale 2 puffs into the lungs 2 (two) times daily. 07/20/20   Lavera Guise, MD  escitalopram Loma Sousa) 10 MG tablet Take one tab po qd for anxiety with food 07/17/20   Lavera Guise, MD  ferrous gluconate (FERGON) 324 MG tablet Take 1 tablet (324 mg total) by mouth daily with breakfast. 08/01/19   [name], NP  furosemide (LASIX) 40 MG tablet Take one to tabs once or twice days for fluid // swelling 07/17/20   Lavera Guise, MD  gabapentin (NEURONTIN) 100 MG capsule Take 1 capsule (100 mg total) by mouth in the morning, at noon, and at bedtime. 07/17/20   Lavera Guise, MD  glimepiride Jari Sportsman) 2 MG tablet Take one tab po qd with supper 07/17/20   Lavera Guise, MD  glucose blood (TRUE METRIX BLOOD GLUCOSE TEST) test strip Use as instructed twice daily  diag E11.65 07/30/19   [name], NP  guaiFENesin-codeine 100-10 MG/5ML syrup Take 5 mLs by mouth every 6 (six) hours as needed for cough. 09/18/20   Harvest Dark, MD  hydrALAZINE (APRESOLINE) 100 MG tablet Take 1 tablet (100 mg total) by mouth 3 (three) times daily. 07/17/20   Lavera Guise, MD  insulin glargine, 1 Unit Dial, (TOUJEO) 300 UNIT/ML Solostar Pen Inject 26 Units into the skin daily. 04/30/20   [name], NP  Insulin Pen Needle 32G X 6 MM MISC Use as directed 03/25/20   [name], NP  ipratropium-albuterol (DUONEB) 0.5-2.5 (3) MG/3ML SOLN Take 3 mLs by nebulization every 6 (six) hours as needed. 03/08/19   Kendell Bane, NP  isosorbide mononitrate (IMDUR) 30 MG 24 hr tablet Take 1 tablet (30 mg total) by mouth daily. 07/17/20   Lavera Guise, MD  metoprolol succinate (TOPROL-XL) 50 MG 24 hr tablet Take 1 tablet (50 mg total) by  mouth at bedtime. Take with or immediately following a meal. 07/17/20   Lavera Guise, MD  nitroGLYCERIN (NITROSTAT) 0.4 MG SL tablet Place 1 tablet (0.4 mg total) under the tongue every 5 (five) minutes x 3 doses as needed for chest pain. 05/24/19   Loletha Grayer, MD  omeprazole (PRILOSEC) 40 MG capsule Take 1 capsule (40 mg total) by mouth in the morning and at bedtime. 07/17/20   Lavera Guise, MD  potassium chloride SA (KLOR-CON) 20 MEQ tablet Take 1 tablet (20 mEq total) by mouth 2 (two) times daily. 07/17/20   Lavera Guise, MD  rosuvastatin (CRESTOR) 10 MG tablet Take 1 tablet (10 mg total) by mouth daily. 07/17/20 10/15/20  Lavera Guise, MD  tiotropium (SPIRIVA HANDIHALER) 18 MCG inhalation capsule Place 1 capsule (18 mcg total) into inhaler and inhale daily. 06/02/20 08/31/20  [name], NP  TRUEplus Lancets 33G MISC Use as directed twice daily diag E11.65 07/30/19   [name], NP  vitamin B-12 (CYANOCOBALAMIN) 1000 MCG tablet Take 1 tablet (1,000 mcg total) by mouth daily. 11/21/19   Enzo Bi, MD  zinc sulfate 220 (50 Zn)  MG capsule Take 1 capsule (220 mg total) by mouth daily. 02/04/20   Oswald Hillock, MD    Physical Exam: Vitals:   09/25/20 1530 09/25/20 1600 09/25/20 1700 09/25/20 1714  BP: 134/71 (!) 148/74    Pulse: 67 61 61   Resp: _0 Temp:    98.4 F (36.9 C)  TempSrc:    Oral  SpO2: 95% 95% 96%   Weight:      Height:         Vitals:   09/25/20 1530 09/25/20 1600 09/25/20 1700 09/25/20 1714  BP: 134/71 (!) 148/74    Pulse: 67 61 61   Resp: _1 Temp:    98.4 F (36.9 C)  TempSrc:    Oral  SpO2: 95% 95% 96%   Weight:      Height:          Constitutional: Alert and oriented x 3. Not in any apparent distress.  Morbidly obese HEENT:      Head: Normocephalic and atraumatic.         Eyes: PERLA, EOMI, Conjunctivae are normal. Sclera is non-icteric.       Mouth/Throat: Mucous membranes are moist.       Neck: Supple with no signs of meningismus. Cardiovascular: Regular rate and rhythm. No murmurs, gallops, or rubs. 2+ symmetrical distal pulses are present . No JVD. No LE edema Respiratory: Respiratory effort normal .Lungs sounds clear bilaterally. No wheezes, crackles, or rhonchi.  Gastrointestinal: Soft, non tender, and non distended with positive bowel sounds.  Central adiposity Genitourinary: No CVA tenderness. Musculoskeletal: Nontender with normal range of motion in all extremities. No cyanosis, or erythema of extremities. Neurologic:  Face is symmetric. Moving all extremities. No gross focal neurologic deficits .  Able to move all extremities Skin: Skin is warm, dry.  No rash or ulcers Psychiatric: Mood and affect are normal   Labs on Admission: I have personally reviewed following labs and imaging studies  CBC: Recent Labs  Lab 09/25/20 1412  WBC 6.5  NEUTROABS 4.2  HGB 11.2*  HCT 34.9*  MCV 87.3  PLT 833   Basic Metabolic Panel: Recent Labs  Lab 09/25/20 1412  NA 139  K 4.2  CL 109  CO2 20*  GLUCOSE 233*  BUN 44*  CREATININE 2.49*  CALCIUM  8.7*   GFR: Estimated Creatinine Clearance: 38.6 mL/min (A) (by C-G formula based on SCr of 2.49 mg/dL (H)). Liver Function Tests: Recent Labs  Lab 09/25/20 1412  AST 22  ALT 17  ALKPHOS 80  BILITOT 0.4  PROT 7.1  ALBUMIN 3.2*   No results for input(s): LIPASE, AMYLASE in the last 168 hours. No results for input(s): AMMONIA in the last 168 hours. Coagulation Profile: Recent Labs  Lab 09/25/20 1412  INR 1.1   Cardiac Enzymes: No results for input(s): CKTOTAL, CKMB, CKMBINDEX, TROPONINI in the last 168 hours. BNP (last 3 results) No results for input(s): PROBNP in the last 8760 hours. HbA1C: No results for input(s): HGBA1C in the last 72 hours. CBG: Recent Labs  Lab 09/25/20 1414  GLUCAP 249*   Lipid Profile: No results for input(s): CHOL, HDL, LDLCALC, TRIG, CHOLHDL, LDLDIRECT in the last 72 hours. Thyroid Function Tests: No results for input(s): TSH, T4TOTAL, FREET4, T3FREE, THYROIDAB in the last 72 hours. Anemia Panel: No results for input(s): VITAMINB12, FOLATE, FERRITIN, TIBC, IRON, RETICCTPCT in the last 72 hours. Urine analysis:    Component Value Date/Time   COLORURINE YELLOW (A) 09/25/2020 1643   APPEARANCEUR HAZY (A) 09/25/2020 1643   APPEARANCEUR Cloudy (A) 11/23/2018 1103   LABSPEC 1.012 09/25/2020 1643   LABSPEC 1.005 03/06/2013 0929   PHURINE 5.0 09/25/2020 1643   GLUCOSEU 150 (A) 09/25/2020 1643   GLUCOSEU Negative 03/06/2013 0929   HGBUR NEGATIVE 09/25/2020 1643   BILIRUBINUR NEGATIVE 09/25/2020 1643   BILIRUBINUR negative 02/20/2019 1404   BILIRUBINUR Negative 11/23/2018 1103   BILIRUBINUR Negative 03/06/2013 0929   KETONESUR NEGATIVE 09/25/2020 1643   PROTEINUR >=300 (A) 09/25/2020 1643   UROBILINOGEN 0.2 02/20/2019 1404   NITRITE NEGATIVE 09/25/2020 1643   LEUKOCYTESUR NEGATIVE 09/25/2020 1643   LEUKOCYTESUR Negative 03/06/2013 0929    Radiological Exams on Admission: CT HEAD CODE STROKE WO CONTRAST  Result Date: 09/25/2020 CLINICAL  DATA:  Code stroke. Neuro deficit, acute, stroke suspected. EXAM: CT HEAD WITHOUT CONTRAST TECHNIQUE: Contiguous axial images were obtained from the base of the skull through the vertex without intravenous contrast. COMPARISON:  Prior head CT examinations 04/29/2019 and earlier. Brain MRI 11/01/2009. FINDINGS: Brain: Cerebral volume is normal. There is no acute intracranial hemorrhage. No demarcated cortical infarct. No extra-axial fluid collection. No evidence of intracranial mass. No midline shift. Vascular: No hyperdense vessel.  Atherosclerotic calcifications. Skull: Normal. Negative for fracture or focal lesion. Sinuses/Orbits: Visualized orbits show no acute finding. Small left sphenoid sinus mucous retention cyst. Mild partial opacification of the bilateral ethmoid air cells. Small focus of polypoid mucosal thickening versus mucous retention cyst within the visualized left maxillary sinus. Mild mucosal thickening and small mucous retention cyst within the visualized right maxillary sinus. ASPECTS ([place] Stroke Program Early CT Score) - Ganglionic level infarction (caudate, lentiform nuclei, internal capsule, insula, M1-M3 cortex): 7 - Supraganglionic infarction (M4-M6 cortex): 3 Total score (0-10 with 10 being normal): 10 These results were called by telephone at the time of interpretation on 09/25/2020 at 2:33 pm to provider [facility] , who verbally acknowledged these results. IMPRESSION: No evidence of acute intracranial abnormality. ASPECTS is 10. Mild paranasal sinus disease as described. Electronically Signed   By: Kellie Simmering DO   On: 09/25/2020 14:34     Assessment/Plan Principal Problem:   CVA (cerebral vascular accident) [facility]) Active Problems:   COPD (chronic obstructive pulmonary disease) ([facility])   Chronic systolic CHF (congestive heart failure) ([place])  CKD stage 3 due to type 2 diabetes mellitus ([facility])   Obesity (BMI 30-39.9)    TIA rule out an acute stroke Patient presents  to the emergency room as a code stroke for evaluation of sudden onset right-sided numbness associated with difficulty swallowing. Initial CT scan of the head without contrast is negative for bleed We will obtain MRI of the brain to rule out an acute infarct Obtain 2D echocardiogram to assess LVEF and rule out cardiac thrombus We will request PT/OT/speech therapy consult We will consult neurology Allow for permissive hypertension until acute stroke is ruled out Place patient on aspirin and high intensity statins    Diabetes mellitus with complications of stage IIIb chronic kidney disease Patient will remain n.p.o. until after he has a bedside swallow evaluation Accu-Chek every 4 hours Renal function is stable Hold glimepiride     Coronary artery disease/chronic systolic heart failure Hold furosemide, metoprolol and nitrates for now until an acute stroke is ruled out Continue aspirin and high intensity statins   COPD Not acutely exacerbated Continue as needed bronchodilator therapy as well as inhaled steroids    Depression Continue Lexapro    Obesity Complicates overall prognosis and care  DVT prophylaxis: SCD Code Status: full code Family Communication: Greater than 50% of time was spent discussing patient's condition and plan of care with him at the bedside.  All questions and concerns have been addressed.  He verbalizes understanding and agrees with the plan. Disposition Plan: Back to previous home environment Consults called: Neurology Status: Observation    Azzam Mehra MD [facility]     09/25/2020, 5:21 PM

## 2020-09-25 NOTE — ED Notes (Signed)
MRI screening done via phone by MRI. Patient to go to MRI in about 1-2 hours per MRI. Patient updated.

## 2020-09-26 ENCOUNTER — Observation Stay (HOSPITAL_BASED_OUTPATIENT_CLINIC_OR_DEPARTMENT_OTHER)
Admit: 2020-09-26 | Discharge: 2020-09-26 | Disposition: A | Discharge status: Home / Self Care | Payer: Medicare PPO | Attending: Internal Medicine | Admitting: Internal Medicine

## 2020-09-26 ENCOUNTER — Observation Stay: Payer: Medicare PPO

## 2020-09-26 DIAGNOSIS — I10 Essential (primary) hypertension: Secondary | ICD-10-CM | POA: Diagnosis not present

## 2020-09-26 DIAGNOSIS — I639 Cerebral infarction, unspecified: Secondary | ICD-10-CM

## 2020-09-26 DIAGNOSIS — R200 Anesthesia of skin: Secondary | ICD-10-CM | POA: Diagnosis not present

## 2020-09-26 DIAGNOSIS — I5022 Chronic systolic (congestive) heart failure: Secondary | ICD-10-CM | POA: Diagnosis not present

## 2020-09-26 DIAGNOSIS — E1169 Type 2 diabetes mellitus with other specified complication: Secondary | ICD-10-CM | POA: Diagnosis not present

## 2020-09-26 DIAGNOSIS — I6359 Cerebral infarction due to unspecified occlusion or stenosis of other cerebral artery: Secondary | ICD-10-CM | POA: Diagnosis not present

## 2020-09-26 DIAGNOSIS — G459 Transient cerebral ischemic attack, unspecified: Secondary | ICD-10-CM | POA: Diagnosis not present

## 2020-09-26 DIAGNOSIS — J449 Chronic obstructive pulmonary disease, unspecified: Secondary | ICD-10-CM | POA: Diagnosis not present

## 2020-09-26 DIAGNOSIS — E119 Type 2 diabetes mellitus without complications: Secondary | ICD-10-CM | POA: Diagnosis not present

## 2020-09-26 DIAGNOSIS — E785 Hyperlipidemia, unspecified: Secondary | ICD-10-CM | POA: Diagnosis not present

## 2020-09-26 LAB — COMPREHENSIVE METABOLIC PANEL
ALT: 17 U/L (ref 0–44)
AST: 16 U/L (ref 15–41)
Albumin: 3.1 g/dL — ABNORMAL LOW (ref 3.5–5.0)
Alkaline Phosphatase: 77 U/L (ref 38–126)
Anion gap: 8 (ref 5–15)
BUN: 46 mg/dL — ABNORMAL HIGH (ref 8–23)
CO2: 21 mmol/L — ABNORMAL LOW (ref 22–32)
Calcium: 8.7 mg/dL — ABNORMAL LOW (ref 8.9–10.3)
Chloride: 110 mmol/L (ref 98–111)
Creatinine, Ser: 2.13 mg/dL — ABNORMAL HIGH (ref 0.61–1.24)
GFR, Estimated: 33 mL/min — ABNORMAL LOW (ref >60)
Glucose, Bld: 130 mg/dL — ABNORMAL HIGH (ref 70–99)
Potassium: 4.1 mmol/L (ref 3.5–5.1)
Sodium: 139 mmol/L (ref 135–145)
Total Bilirubin: 0.8 mg/dL (ref 0.3–1.2)
Total Protein: 6.8 g/dL (ref 6.5–8.1)

## 2020-09-26 LAB — HIV ANTIBODY (ROUTINE TESTING W REFLEX): HIV Screen 4th Generation wRfx: NONREACTIVE

## 2020-09-26 LAB — ECHOCARDIOGRAM COMPLETE
AR max vel: 2.78 cm2
AV Peak grad: 6.2 mmHg
Ao pk vel: 1.24 m/s
Area-P 1/2: 2.87 cm2
Height: 71 in
S' Lateral: 4.66 cm
Single Plane A4C EF: 48.9 %
Weight: 4366.87 oz

## 2020-09-26 LAB — GLUCOSE, CAPILLARY
Glucose-Capillary: 166 mg/dL — ABNORMAL HIGH (ref 70–99)
Glucose-Capillary: 126 mg/dL — ABNORMAL HIGH (ref 70–99)
Glucose-Capillary: 264 mg/dL — ABNORMAL HIGH (ref 70–99)

## 2020-09-26 LAB — LIPID PANEL
Cholesterol: 133 mg/dL (ref 0–200)
HDL: 36 mg/dL — ABNORMAL LOW (ref >40)
LDL Cholesterol: 72 mg/dL (ref 0–99)
Total CHOL/HDL Ratio: 3.7 RATIO
Triglycerides: 124 mg/dL (ref <150)
VLDL: 25 mg/dL (ref 0–40)

## 2020-09-26 MED ORDER — LABETALOL HCL 5 MG/ML IV SOLN
20.0000 mg | INTRAVENOUS | Status: DC | PRN
  Administered 2020-09-26: 07:00:00 20 mg via INTRAVENOUS
  Filled 2020-09-26: qty 4

## 2020-09-26 MED ORDER — PERFLUTREN LIPID MICROSPHERE
1.0000 mL | INTRAVENOUS | Status: AC | PRN
Start: 2020-09-26 — End: 2020-09-26
  Administered 2020-09-26: 5 mL via INTRAVENOUS
  Filled 2020-09-26: qty 10

## 2020-09-26 MED ORDER — CLOPIDOGREL BISULFATE 75 MG PO TABS
75.0000 mg | ORAL_TABLET | Freq: Every day | ORAL | Status: DC
  Administered 2020-09-26: 75 mg via ORAL
  Filled 2020-09-26: qty 1

## 2020-09-26 MED ORDER — ASPIRIN EC 81 MG PO TBEC: 81.0000 mg | DELAYED_RELEASE_TABLET | Freq: Every day | ORAL | 2 refills | Status: DC

## 2020-09-26 NOTE — Discharge Summary (Signed)
Physician Discharge Summary  Larry Callahan ZJQ:[id number] DOB: 14-May-1956 DOA: 09/25/2020  PCP: Lavera Guise, MD  Admit date: 09/25/2020 Discharge date: 09/26/2020  Time spent: 33 minutes  Recommendations for Outpatient Follow-up:  1. Start ASA this admit 2. Needs labs ~ 1 week 3. Recommend OP non emergent cardiology follow up for slightyl lower EF this admit   Discharge Diagnoses:  MAIN problem for hospitalization   TIA  Please see below for itemized issues addressed in HOpsital- refer to other progress notes for clarity if needed  Discharge Condition: Improved-patient independent on discharge not requiring therapy services  Diet recommendation: Heart healthy low-salt  Filed Weights   09/25/20 1403  Weight: 123.8 kg    History of present illness:  65 year old BMI 47, prior nephrectomy solitary kidney CKD 3B HTN multiple GI bleeds in the past not on anticoagulation-also history of esophagitis, COPD with chronic dyspnea anemia of iron and B12 deficiency OSA with questionable compliance on CPAP neuropathy reflux admitted to [facility] with right-sided numbness while having  lunch with sister Work-up showed no evidence of acute intracranial abnormality on CT head Seen by neurology not a candidate for TPA secondary to NIHSS being 2  Therapy services evaluated him subsequently during hospital stay and and patient was cleared by them for possible discharge home without further work-up He does not take aspirin because he has been taken off this by his primary care physician Dr. Chancy Milroy I have encouraged him after much discussion with neurology and after conferring with his sister (retired Marine scientist) that he should take aspirin to for secondary prevention of further strokes On telemetry today he has only PVCs and I do not think a loop recorder is indicated in his case given this would not change our plan regarding antiplatelet therapy  He met maximal hospital benefit and was  discharged in a stable state and will be getting a Voucher to be transported home  Discharge Exam: Vitals:   09/26/20 0850 09/26/20 1143  BP: (!) 154/73 (!) 153/68  Pulse: (!) 59 60  Resp: 17 17  Temp: 97.6 F (36.4 C) 97.6 F (36.4 C)  SpO2: 96% 97%    Subj on day of d/c   Awake alert coherent tells me he is pretty independent cooks and cleans for himself and was about to mow the lawn on Thursday Jovial in no distress eating and drinking  General Exam on discharge  EOMI NCAT no focal deficit vision by direct confrontation is intact Mallampati 4 no JVD no bruit S1-S2 PVCs on monitors Abdomen obese nontender no rebound no guarding No lower extremity edema Finger-nose-finger intact power 5/5 reflexes deferred gait not assessed  Discharge Instructions   Discharge Instructions    Diet - low sodium heart healthy   Complete by: As directed    Discharge instructions   Complete by: As directed    Look at your meds carefully as several have ahcanged slightly TAKE ASPIRIN for minstroke   Increase activity slowly   Complete by: As directed      Allergies as of 09/26/2020      Reactions   Penicillins Rash      Medication List    STOP taking these medications   nitroGLYCERIN 0.4 MG SL tablet Commonly known as: NITROSTAT     TAKE these medications   acetaminophen 325 MG tablet Commonly known as: TYLENOL Take 2 tablets (650 mg total) by mouth every 4 (four) hours as needed for headache or mild pain.   albuterol  108 (90 Base) MCG/ACT inhaler Commonly known as: VENTOLIN HFA Inhale 2 puffs into the lungs every 6 (six) hours as needed for wheezing or shortness of breath.   ascorbic acid 500 MG tablet Commonly known as: VITAMIN C Take 1 tablet (500 mg total) by mouth daily.   aspirin EC 81 MG tablet Take 1 tablet (81 mg total) by mouth daily. Swallow whole.   budesonide-formoterol 160-4.5 MCG/ACT inhaler Commonly known as: SYMBICORT Inhale 2 puffs into the lungs 2  (two) times daily.   escitalopram 10 MG tablet Commonly known as: Lexapro Take one tab po qd for anxiety with food   ferrous gluconate 324 MG tablet Commonly known as: FERGON Take 1 tablet (324 mg total) by mouth daily with breakfast.   furosemide 40 MG tablet Commonly known as: LASIX Take one to tabs once or twice days for fluid // swelling   gabapentin 100 MG capsule Commonly known as: NEURONTIN Take 1 capsule (100 mg total) by mouth in the morning, at noon, and at bedtime.   gemfibrozil 600 MG tablet Commonly known as: LOPID Take 600 mg by mouth 2 (two) times daily.   glimepiride 2 MG tablet Commonly known as: AMARYL Take one tab po qd with supper   guaiFENesin-codeine 100-10 MG/5ML syrup Take 5 mLs by mouth every 6 (six) hours as needed for cough.   hydrALAZINE 100 MG tablet Commonly known as: APRESOLINE Take 1 tablet (100 mg total) by mouth 3 (three) times daily.   insulin glargine (1 Unit Dial) 300 UNIT/ML Solostar Pen Commonly known as: TOUJEO Inject 26 Units into the skin daily.   Insulin Pen Needle 32G X 6 MM Misc Use as directed   ipratropium-albuterol 0.5-2.5 (3) MG/3ML Soln Commonly known as: DUONEB Take 3 mLs by nebulization every 6 (six) hours as needed.   isosorbide mononitrate 30 MG 24 hr tablet Commonly known as: IMDUR Take 1 tablet (30 mg total) by mouth daily.   metoprolol succinate 50 MG 24 hr tablet Commonly known as: TOPROL-XL Take 1 tablet (50 mg total) by mouth at bedtime. Take with or immediately following a meal.   omeprazole 40 MG capsule Commonly known as: PRILOSEC Take 1 capsule (40 mg total) by mouth in the morning and at bedtime.   potassium chloride SA 20 MEQ tablet Commonly known as: KLOR-CON Take 1 tablet (20 mEq total) by mouth 2 (two) times daily.   rosuvastatin 10 MG tablet Commonly known as: CRESTOR Take 1 tablet (10 mg total) by mouth daily.   Spiriva HandiHaler 18 MCG inhalation capsule Generic drug:  tiotropium Place 1 capsule (18 mcg total) into inhaler and inhale daily.   True Metrix Air Glucose Meter Devi 1 Device by Does not apply route 2 (two) times daily.   True Metrix Blood Glucose Test test strip Generic drug: glucose blood Use as instructed twice daily diag E11.65   True Metrix Level 1 Low Soln Use as directed   TRUEplus Lancets 33G Misc Use as directed twice daily diag E11.65   vitamin B-12 1000 MCG tablet Commonly known as: CYANOCOBALAMIN Take 1 tablet (1,000 mcg total) by mouth daily.   zinc sulfate 220 (50 Zn) MG capsule Take 1 capsule (220 mg total) by mouth daily.      Allergies  Allergen Reactions  . Penicillins Rash      The results of significant diagnostics from this hospitalization (including imaging, microbiology, ancillary and laboratory) are listed below for reference.    Significant Diagnostic Studies: MR ANGIO HEAD  WO CONTRAST  Result Date: 09/26/2020 CLINICAL DATA:  65 year old male code stroke presentation. Right side numbness. EXAM: MRA HEAD WITHOUT CONTRAST TECHNIQUE: Angiographic images of the Circle of Willis were obtained using MRA technique without intravenous contrast. COMPARISON:  Brain MRI today reported separately. FINDINGS: Antegrade flow in the posterior circulation. Mildly dominant distal right vertebral artery. Mild motion artifact in the V4 segments. No distal vertebral stenosis suspected. Normal vertebrobasilar junction. Patent basilar artery without stenosis. Normal SCA and PCA origins. Tortuous P1 segment. Bilateral PCA branches are within normal limits. Tortuous cervical right ICA just below the skull base. Antegrade flow in both ICA siphons without stenosis. Normal ophthalmic artery origins. Right posterior communicating artery is present with a normal origin. The left is diminutive or absent. Patent carotid termini. Normal MCA and ACA origins. Mildly dominant right A1 segment. Diminutive or absent anterior communicating artery.  Bilateral ACA branches are within normal limits. Tortuous bilateral MCA M1 segments. Infundibulum of the left lenticulostriate artery origin (normal variant). Visible bilateral MCA branches are within normal limits. IMPRESSION: Negative intracranial MRA. Electronically Signed   By: Genevie Ann M.D.   On: 09/26/2020 08:09   MR BRAIN WO CONTRAST  Result Date: 09/26/2020 CLINICAL DATA:  65 year old male code stroke presentation. Right side numbness. EXAM: MRI HEAD WITHOUT CONTRAST TECHNIQUE: Multiplanar, multiecho pulse sequences of the brain and surrounding structures were obtained without intravenous contrast. COMPARISON:  Head CT 09/25/2020.  Brain MRI 11/01/2009. FINDINGS: Brain: Cerebral volume is within normal limits for age, not significantly changed since 2011. No restricted diffusion to suggest acute infarction. No midline shift, mass effect, evidence of mass lesion, ventriculomegaly, extra-axial collection or acute intracranial hemorrhage. Cervicomedullary junction and pituitary are within normal limits. Pearline Cables and white matter signal is within normal limits for age throughout the brain. No encephalomalacia or chronic cerebral blood products identified. Vascular: Major intracranial vascular flow voids are stable since 2011. Dominant appearing right vertebral artery. Skull and upper cervical spine: Negative for age visible cervical spine. Normal visible bone marrow signal. Sinuses/Orbits: Negative. Other: Mastoid air cells remain well aerated. Grossly normal visible internal auditory structures. 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 IMPRESSION: No acute intracranial abnormality. Normal for age noncontrast MRI appearance of the brain. Electronically Signed   By: Genevie Ann M.D.   On: 09/26/2020 08:06   US Carotid Bilateral  Result Date: 09/26/2020 CLINICAL DATA:  Stroke Hypertension  Hyperlipidemia Diabetes EXAM: BILATERAL CAROTID DUPLEX ULTRASOUND TECHNIQUE: Pearline Cables scale imaging, color Doppler and duplex ultrasound were performed of bilateral carotid and vertebral arteries in the neck. COMPARISON:  None. FINDINGS: Criteria: Quantification of carotid stenosis is based on velocity parameters that correlate the residual internal carotid diameter with NASCET-based stenosis levels, using the diameter of the distal internal carotid lumen as the denominator for stenosis measurement. The following velocity measurements were obtained: RIGHT ICA: 95/28 cm/sec CCA: 04/54 cm/sec SYSTOLIC ICA/CCA RATIO:  1.8 ECA: 137 cm/sec LEFT ICA: 121/26 cm/sec CCA: 09/81 cm/sec SYSTOLIC ICA/CCA RATIO:  1.5 ECA: 79 cm/sec RIGHT CAROTID ARTERY: No significant atheromatous plaque. RIGHT VERTEBRAL ARTERY:  Antegrade flow. LEFT CAROTID ARTERY: Minimal atheromatous plaque of the left internal carotid artery origin without flow-limiting stenosis. LEFT VERTEBRAL ARTERY:  Antegrade flow. IMPRESSION: No significant stenosis of internal carotid arteries. Electronically Signed   By: Miachel Roux M.D.   On: 09/26/2020 09:22   DG Chest Portable 1 View  Result Date: 09/18/2020 CLINICAL DATA:  Cough with chest pain and shortness of breath 412 hours. EXAM: PORTABLE CHEST 1 VIEW COMPARISON:  Radiographs 07/20/2020 and 07/11/2020.  CT 01/24/2020. FINDINGS: 1234 hours. Mild patient rotation to the right. The heart size and mediastinal contours are stable. There is stable vascular crowding and atelectasis at both lung bases. No edema, confluent airspace opacity, pneumothorax or significant pleural effusion. The bones appear unchanged. Telemetry leads overlie the chest. IMPRESSION: Stable appearance of the chest with mild vascular crowding and bibasilar atelectasis. No acute findings. Electronically Signed   By: Richardean Sale M.D.   On: 09/18/2020 13:20   ECHOCARDIOGRAM COMPLETE  Result Date: 09/26/2020    ECHOCARDIOGRAM REPORT    Patient Name:   STAFFORD RIVIERA [facility] Date of Exam: 09/26/2020 Medical Rec #:  [id number]              Height:       71.0 in Accession #:    [id number]             Weight:       272.9 lb Date of Birth:  [birth year]              BSA:          2.406 m Patient Age:    37 years               BP:           164/78 mmHg Patient Gender: M                      HR:           58 bpm. Exam Location:  [name] Procedure: 2D Echo and Intracardiac Opacification Agent Indications:     Stroke I63.9  History:         Patient has prior history of Echocardiogram examinations, most                  recent 04/27/2019.  Sonographer:     Kathlen Brunswick RDCS Referring Phys:  [id number] RDEYCXKG AGBATA Diagnosing Phys: Serafina Royals MD  Sonographer Comments: Technically difficult study due to poor echo windows and patient is morbidly obese. Image acquisition challenging due to patient body habitus. IMPRESSIONS  1. Left ventricular ejection fraction, by estimation, is 35 to 40%. The left ventricle has moderately decreased function. The left ventricle demonstrates global hypokinesis. The left ventricular internal cavity size was mildly dilated. Left ventricular diastolic parameters were normal.  2. Right ventricular systolic function is normal. The right ventricular size is normal.  3. The mitral valve is normal in structure. Mild mitral valve regurgitation.  4. The aortic valve is normal in structure. Aortic valve regurgitation is not visualized. FINDINGS  Left Ventricle: Left ventricular ejection fraction, by estimation, is 35 to 40%. The left ventricle has moderately decreased function. The left ventricle demonstrates global hypokinesis. Definity contrast agent was given IV to delineate the left ventricular endocardial borders. The left ventricular internal cavity size was mildly dilated. There is no left ventricular hypertrophy. Left ventricular diastolic parameters were normal. Right Ventricle: The right ventricular size is normal. No increase  in right ventricular wall thickness. Right ventricular systolic function is normal. Left Atrium: Left atrial size was normal in size. Right Atrium: Right atrial size was normal in size. Pericardium: There is no evidence of pericardial effusion. Mitral Valve: The mitral valve is normal in structure. Mild mitral valve regurgitation. Tricuspid Valve: The tricuspid valve is normal in structure. Tricuspid valve regurgitation is  mild. Aortic Valve: The aortic valve is normal in structure. Aortic valve regurgitation is not visualized. Aortic valve peak gradient measures 6.2 mmHg. Pulmonic Valve: The pulmonic valve was normal in structure. Pulmonic valve regurgitation is trivial. Aorta: The aortic root and ascending aorta are structurally normal, with no evidence of dilitation. IAS/Shunts: No atrial level shunt detected by color flow Doppler.  LEFT VENTRICLE PLAX 2D LVIDd:         5.68 cm      Diastology LVIDs:         4.66 cm      LV e' medial:   5.11 cm/s LV PW:         1.16 cm      LV E/e' medial: 15.8 LV IVS:        1.11 cm LVOT diam:     2.20 cm LV SV:         71 LV SV Index:   30 LVOT Area:     3.80 cm  LV Volumes (MOD) LV vol d, MOD A4C: 120.0 ml LV vol s, MOD A4C: 61.3 ml LV SV MOD A4C:     120.0 ml RIGHT VENTRICLE RV Basal diam:  3.65 cm RV S prime:     15.10 cm/s TAPSE (M-mode): 2.5 cm LEFT ATRIUM             Index       RIGHT ATRIUM           Index LA diam:        4.80 cm 1.99 cm/m  RA Area:     14.50 cm LA Vol (A2C):   36.1 ml 15.00 ml/m RA Volume:   42.00 ml  17.45 ml/m LA Vol (A4C):   47.3 ml 19.66 ml/m LA Biplane Vol: 44.1 ml 18.33 ml/m  AORTIC VALVE                PULMONIC VALVE AV Area (Vmax): 2.78 cm    PV Vmax:       0.92 m/s AV Vmax:        124.00 cm/s PV Peak grad:  3.4 mmHg AV Peak Grad:   6.2 mmHg LVOT Vmax:      90.70 cm/s LVOT Vmean:     56.800 cm/s LVOT VTI:       0.188 m  AORTA Ao Root diam: 3.40 cm MITRAL VALVE               TRICUSPID VALVE MV Area (PHT): 2.87 cm    TV Peak grad:   25.6  mmHg MV Decel Time: 264 msec    TV Vmax:        2.53 m/s MV E velocity: 80.60 cm/s MV A velocity: 79.30 cm/s  SHUNTS MV E/A ratio:  1.02        Systemic VTI:  0.19 m                            Systemic Diam: 2.20 cm Serafina Royals MD Electronically signed by Serafina Royals MD Signature Date/Time: 09/26/2020/8:59:58 AM    Final    CT HEAD CODE STROKE WO CONTRAST  Result Date: 09/25/2020 CLINICAL DATA:  Code stroke. Neuro deficit, acute, stroke suspected. EXAM: CT HEAD WITHOUT CONTRAST TECHNIQUE: Contiguous axial images were obtained from the base of the skull through the vertex without intravenous contrast. COMPARISON:  Prior head CT examinations 04/29/2019 and earlier. Brain MRI 11/01/2009. FINDINGS: Brain: Cerebral volume is  normal. There is no acute intracranial hemorrhage. No demarcated cortical infarct. No extra-axial fluid collection. No evidence of intracranial mass. No midline shift. Vascular: No hyperdense vessel.  Atherosclerotic calcifications. Skull: Normal. Negative for fracture or focal lesion. Sinuses/Orbits: Visualized orbits show no acute finding. Small left sphenoid sinus mucous retention cyst. Mild partial opacification of the bilateral ethmoid air cells. Small focus of polypoid mucosal thickening versus mucous retention cyst within the visualized left maxillary sinus. Mild mucosal thickening and small mucous retention cyst within the visualized right maxillary sinus. ASPECTS ([place] Stroke Program Early CT Score) - Ganglionic level infarction (caudate, lentiform nuclei, internal capsule, insula, M1-M3 cortex): 7 - Supraganglionic infarction (M4-M6 cortex): 3 Total score (0-10 with 10 being normal): 10 These results were called by telephone at the time of interpretation on 09/25/2020 at 2:33 pm to provider [facility] , who verbally acknowledged these results. IMPRESSION: No evidence of acute intracranial abnormality. ASPECTS is 10. Mild paranasal sinus disease as described.  Electronically Signed   By: Kellie Simmering DO   On: 09/25/2020 14:34    Microbiology: Recent Results (from the past 240 hour(s))  Resp Panel by RT-PCR (Flu A&B, Covid) Nasopharyngeal Swab     Status: None   Collection Time: 09/25/20  2:12 PM   Specimen: Nasopharyngeal Swab; Nasopharyngeal(NP) swabs in vial transport medium  Result Value Ref Range Status   SARS Coronavirus 2 by RT PCR NEGATIVE NEGATIVE Final    Comment: (NOTE) SARS-CoV-2 target nucleic acids are NOT DETECTED.  The SARS-CoV-2 RNA is generally detectable in upper respiratory specimens during the acute phase of infection. The lowest concentration of SARS-CoV-2 viral copies this assay can detect is 138 copies/mL. A negative result does not preclude SARS-Cov-2 infection and should not be used as the sole basis for treatment or other patient management decisions. A negative result may occur with  improper specimen collection/handling, submission of specimen other than nasopharyngeal swab, presence of viral mutation(s) within the areas targeted by this assay, and inadequate number of viral copies(<138 copies/mL). A negative result must be combined with clinical observations, patient history, and epidemiological information. The expected result is Negative.  Fact Sheet for Patients:  EntrepreneurPulse.com.au  Fact Sheet for Healthcare Providers:  IncredibleEmployment.be  This test is no t yet approved or cleared by the [place] FDA and  has been authorized for detection and/or diagnosis of SARS-CoV-2 by FDA under an Emergency Use Authorization (EUA). This EUA will remain  in effect (meaning this test can be used) for the duration of the COVID-19 declaration under Section 564(b)(1) of the Act, 21 U.S.C.section 360bbb-3(b)(1), unless the authorization is terminated  or revoked sooner.       Influenza A by PCR NEGATIVE NEGATIVE Final   Influenza B by PCR NEGATIVE NEGATIVE Final     Comment: (NOTE) The Xpert Xpress SARS-CoV-2/FLU/RSV plus assay is intended as an aid in the diagnosis of influenza from Nasopharyngeal swab specimens and should not be used as a sole basis for treatment. Nasal washings and aspirates are unacceptable for Xpert Xpress SARS-CoV-2/FLU/RSV testing.  Fact Sheet for Patients: EntrepreneurPulse.com.au  Fact Sheet for Healthcare Providers: IncredibleEmployment.be  This test is not yet approved or cleared by the [place] FDA and has been authorized for detection and/or diagnosis of SARS-CoV-2 by FDA under an Emergency Use Authorization (EUA). This EUA will remain in effect (meaning this test can be used) for the duration of the COVID-19 declaration under Section 564(b)(1) of the Act, 21 U.S.C. section 360bbb-3(b)(1), unless the authorization  is terminated or revoked.  Performed at [facility], [address] [place], [place] [address]      Labs: Basic Metabolic Panel: Recent Labs  Lab 09/25/20 1412 09/26/20 0856  NA 139 139  K 4.2 4.1  CL 109 110  CO2 20* 21*  GLUCOSE 233* 130*  BUN 44* 46*  CREATININE 2.49* 2.13*  CALCIUM 8.7* 8.7*   Liver Function Tests: Recent Labs  Lab 09/25/20 1412 09/26/20 0856  AST 22 16  ALT 17 17  ALKPHOS 80 77  BILITOT 0.4 0.8  PROT 7.1 6.8  ALBUMIN 3.2* 3.1*   No results for input(s): LIPASE, AMYLASE in the last 168 hours. No results for input(s): AMMONIA in the last 168 hours. CBC: Recent Labs  Lab 09/25/20 1412  WBC 6.5  NEUTROABS 4.2  HGB 11.2*  HCT 34.9*  MCV 87.3  PLT 225   Cardiac Enzymes: No results for input(s): CKTOTAL, CKMB, CKMBINDEX, TROPONINI in the last 168 hours. BNP: BNP (last 3 results) Recent Labs    12/21/19 1111 01/24/20 1431 07/20/20 1652  BNP 44.5 94.5 84.6    ProBNP (last 3 results) No results for input(s): PROBNP in the last 8760 hours.  CBG: Recent Labs  Lab 09/25/20 1826 09/25/20 2135  09/26/20 0609 09/26/20 0850 09/26/20 1144  GLUCAP 132* 127* 166* 126* 264*       Signed:  Nita Sells MD   [facility] 09/26/2020, 12:06 PM

## 2020-09-26 NOTE — Progress Notes (Signed)
Received Md order to discharge patient  to home.  Reviewed discharge instructions, home meds, prescriptions and follow up appointments with patient and patient verbalized understanding

## 2020-09-26 NOTE — Social Work (Signed)
CSW filled out taxi voucher per nurse request and placed in patient's chart.     Ardelle Anton, MSW, LCSW Licensed Holiday representative - PRN (Transition of Care Team) [facility] [place]

## 2020-09-26 NOTE — Progress Notes (Signed)
Pt care assumed. Pt consulted for cpap usage. Unit placed @ bedside. Pt states to only be able to use XL mask. XL mask not available @ this time. Pt stated he would wear o2 if needed as he slept. RN made aware, pt left in nard.

## 2020-09-26 NOTE — Evaluation (Signed)
Occupational Therapy Evaluation Patient Details Name: Larry Callahan MRN: [id number] DOB: 09/27/1955 Today's Date: 09/26/2020    History of Present Illness Pt is a 65 y/o M admitted from home on 09/25/20 with c/c of R sided numbness (arm, leg & face). Pt did not receive tPA. Imaging negative for acute processes. PMH: morbid obesity, CAD, DM2 with complications of Stage 3 CKD, GERD, HTN, dyslipidemia, COPD, OSA, diastolic CHF   Clinical Impression   Pt seen for OT evaluation this date in setting of acute hospitalization d/t R side numbness. Pt presents this date with s/s largely resolved. Functional strength, ROM, sensation and FMC assessment performed and all aspects WFL  And equal bilaterally. Pt reports feeling that his sensation is back to normal. Pt able to perform all ADLs/ADL mobility assessed at MOD I (for increased time) to INDEP level. OT educates re: safety considerations including monitoring BP QD to prevent risk for future stroke and potential loss of function/INDEP with ADLs. Pt with moderate reception, but reports he can f/u with his HHRN that comes at least once/month. No further OT needs perceived. Will complete order.     Follow Up Recommendations  No OT follow up    Equipment Recommendations  None recommended by OT    Recommendations for Other Services       Precautions / Restrictions Precautions Precautions: None Restrictions Weight Bearing Restrictions: No      Mobility Bed Mobility Overal bed mobility: Modified Independent             General bed mobility comments: HOB elevated, uses momentum to upright self for supine>sit    Transfers Overall transfer level: Modified independent                    Balance Overall balance assessment: Mild deficits observed, not formally tested   Sitting balance-Leahy Scale: Normal       Standing balance-Leahy Scale: Good                             ADL either performed or assessed  with clinical judgement   ADL Overall ADL's : Modified independent;At baseline                                       General ADL Comments: increased time, but not physical assistance required     Vision Patient Visual Report: No change from baseline       Perception     Praxis      Pertinent Vitals/Pain Pain Assessment: No/denies pain     Hand Dominance Right   Extremity/Trunk Assessment Upper Extremity Assessment Upper Extremity Assessment: Overall WFL for tasks assessed (sensation in R UE is reported to now feel equal to L UE upon assessment. Edie in tact and ROM/MMT functional)   Lower Extremity Assessment Lower Extremity Assessment: Overall WFL for tasks assessed       Communication Communication Communication: No difficulties (tangential)   Cognition Arousal/Alertness: Awake/alert Behavior During Therapy: WFL for tasks assessed/performed Overall Cognitive Status: Within Functional Limits for tasks assessed                                 General Comments: AxO x 4; pt with tangential speech throughout. While he does give PLOF information, he is also noted  to become easily distracted requiring gentle re-direction, repetition of the question.   General Comments  SpO2 >90% throughout on room air even when pt c/o slight SOB during gait    Exercises Other Exercises Other Exercises: OT facilitates ed re: role of OT in acute setting. Pt with good udnersatnding. OT ed re: checking BP QD to prevent further stroke and potential loss of function. Pt with good understanding, but will require f/u education, states he has [facility] RN that can f/u with him.   Shoulder Instructions      Home Living Family/patient expects to be discharged to:: Private residence Living Arrangements: Alone Available Help at Discharge: Family;Available PRN/intermittently (sister lives close) Type of Home: Mobile home Home Access: Stairs to enter Entrance Stairs-Number of  Steps: 5 Entrance Stairs-Rails: Left Home Layout: One level     Bathroom Shower/Tub: Teacher, early years/pre: Standard     Home Equipment: Cane - single point;Bedside commode;Walker - 2 wheels   Additional Comments: has CPAP machine      Prior Functioning/Environment Level of Independence: Independent        Comments: Independent, only uses SPC PRN but doesn't explain when he needs it. Pt reports he has a home care nurse but is unable to elaborate on how often she comes by (it appears she calls on him & stops by occasionally)        OT Problem List: Impaired sensation      OT Treatment/Interventions: Self-care/ADL training;Therapeutic activities    OT Goals(Current goals can be found in the care plan section) Acute Rehab OT Goals Patient Stated Goal: go home OT Goal Formulation: All assessment and education complete, DC therapy  OT Frequency:     Barriers to D/C:            Co-evaluation              AM-PAC OT "6 Clicks" Daily Activity     Outcome Measure Help from another person eating meals?: None Help from another person taking care of personal grooming?: None Help from another person toileting, which includes using toliet, bedpan, or urinal?: None Help from another person bathing (including washing, rinsing, drying)?: None Help from another person to put on and taking off regular upper body clothing?: None Help from another person to put on and taking off regular lower body clothing?: None 6 Click Score: 24   End of Session Nurse Communication: Mobility status  Activity Tolerance: Patient tolerated treatment well Patient left: Other (comment) (seated EOB)  OT Visit Diagnosis: Other symptoms and signs involving the nervous system (B71.696)                Time: 7893-8101 OT Time Calculation (min): 19 min Charges:  OT General Charges $OT Visit: 1 Visit OT Evaluation $OT Eval Low Complexity: 1 Low OT Treatments $Self Care/Home  Management : 8-22 mins  Gerrianne Scale, MS, OTR/L ascom [phone] 09/26/20, 1:56 PM

## 2020-09-26 NOTE — Evaluation (Addendum)
Clinical/Bedside Swallow Evaluation Patient Details  Name: Larry Callahan MRN: [id number] Date of Birth: Nov 14, 1955  Today's Date: 09/26/2020 Time: SLP Start Time (ACUTE ONLY): 0910 SLP Stop Time (ACUTE ONLY): 1000 SLP Time Calculation (min) (ACUTE ONLY): 50 min  Past Medical History:  Past Medical History:  Diagnosis Date  . Allergy   . Coronary artery disease   . Diabetes mellitus without complication ([place])   . Diastolic CHF ([place])   . GERD (gastroesophageal reflux disease)   . Hyperlipidemia   . Hypertension   . Renal insufficiency   . Sleep apnea    Past Surgical History:  Past Surgical History:  Procedure Laterality Date  . BACK SURGERY    . CARDIAC CATHETERIZATION    . CHOLECYSTECTOMY    . COLONOSCOPY WITH PROPOFOL N/A 04/12/2019   Procedure: COLONOSCOPY WITH PROPOFOL;  Surgeon: [name], MD;  Location: [facility] ENDOSCOPY;  Service: Gastroenterology;  Laterality: N/A;  . ESOPHAGOGASTRODUODENOSCOPY N/A 04/12/2019   Procedure: ESOPHAGOGASTRODUODENOSCOPY (EGD);  Surgeon: [name], MD;  Location: [facility] ENDOSCOPY;  Service: Gastroenterology;  Laterality: N/A;  . ESOPHAGOGASTRODUODENOSCOPY (EGD) WITH PROPOFOL N/A 11/21/2019   Procedure: ESOPHAGOGASTRODUODENOSCOPY (EGD) WITH PROPOFOL;  Surgeon: [name], MD;  Location: [facility] ENDOSCOPY;  Service: Gastroenterology;  Laterality: N/A;  . FLEXIBLE BRONCHOSCOPY Bilateral 05/17/2019   Procedure: FLEXIBLE BRONCHOSCOPY;  Surgeon: [name], MD;  Location: [name] ORS;  Service: Pulmonary;  Laterality: Bilateral;  . left arm surgery    . nephrectomy Left   . PARATHYROIDECTOMY    . RIGHT HEART CATH N/A 03/29/2019   Procedure: RIGHT HEART CATH;  Surgeon: [name], MD;  Location: [facility] [facility] LAB;  Service: Cardiovascular;  Laterality: N/A;   HPI:  Pt is a 65 y.o. male with PMH significant for CAD, DM2, GERD, Obestiy, CHF, HTN, HLD, OSA, COPD, smoker in the past and quit who presents with R  sided numbness with a NIHSS of 2 initially -- now all resolved per Neurology note.  Pt reported that PCP took him off aspirin 63m. Gets some bruising when he is on aspirin if he bumps into anything. Denies ever having any episodes of numbness or weakness in the past with his migraine. Headache is mid, mostly resolved.  Pt stated he was suppose to be on CPAP at night.  MRI: No acute intracranial abnormality. Normal for age noncontrast MRI  appearance of the brain.   Assessment / Plan / Recommendation Clinical Impression  Pt appears to present w/ adequate oropharyngeal phase swallow w/ No oropharyngeal phase dysphagia noted, No neuromuscular deficits noted. Pt consumed po trials w/ No overt, clinical s/s of aspiration during po trials. Pt appears at reduced risk for aspiration when following general aspiration precautions. During po trials, pt consumed all consistencies w/ no overt coughing, decline in vocal quality, or change in respiratory presentation during/post trials. [facility] timely bolus management, mastication, and control of bolus propulsion for A-P transfer for swallowing. Oral clearing achieved w/ all trial consistencies. OM Exam [facility] no unilateral weakness noted. Speech Clear. Pt fed self w/ setup support -- needed Min+ cues to reposition more Upright in bed to eat/drink (aspiration precautions discussed w/ pt including this for safer oral intake). Recommend continue a Regular consistency diet w/ well-Cut meats, moistened foods; Thin liquids. Recommend general aspiration precautions, Pills WHOLE in Puree for safer, easier swallowing IF any difficulty swallow w/ liquids. Encouraged Rest Breaks during meals for conservation of energy IF any increased WOB d/t  exertion of physical task(meal). GERD precautions to reduce REFLUX. Education given on Pills in Puree; food consistencies and easy to eat options; general aspiration precautions. NSG to reconsult if any new needs arise.  NSG agreed. MD updated. SLP Visit Diagnosis: Dysphagia, unspecified (R13.10)    Aspiration Risk   (reduced following general precautions)    Diet Recommendation  Regular diet w/ thin liquids; moist/cut foods. General aspiration and GERD/REFLUX precautions  Medication Administration: Whole meds with liquid    Other  Recommendations Recommended Consults:  (Dietician f/u) Oral Care Recommendations: Oral care BID;Patient independent with oral care Other Recommendations:  (n/a)   Follow up Recommendations None      Frequency and Duration  (n/a)   (n/a)       Prognosis Prognosis for Safe Diet Advancement: Good Barriers to Reach Goals:  (n/a)      Swallow Study   General Date of Onset: 09/25/20 HPI: Pt is a 65 y.o. male with PMH significant for CAD, DM2, GERD, Obestiy, HTN, HLD, OSA, COPD, smoker in the past and quit who presents with R sided numbness with a NIHSS of 2 initially -- now all resolved per Neurology note.  Pt reported that PCP took him off aspirin 52m. Gets some bruising when he is on aspirin if he bumps into anything. Denies ever having any episodes of numbness or weakness in the past with his migraine. Headache is mid, mostly resolved.  Pt stated he was suppose to be on CPAP at night.  MRI: No acute intracranial abnormality. Normal for age noncontrast MRI  appearance of the brain. Type of Study: Bedside Swallow Evaluation Previous Swallow Assessment: none Diet Prior to this Study: Regular;Thin liquids Temperature Spikes Noted: No (wbc 6.5) Respiratory Status: Room air History of Recent Intubation: No Behavior/Cognition: Alert;Cooperative;Pleasant mood;Distractible (intermittnetly) Oral Cavity Assessment: Within Functional Limits Oral Care Completed by SLP: Recent completion by staff Oral Cavity - Dentition: Missing dentition Vision: Functional for self-feeding Self-Feeding Abilities: Able to feed self;Needs set up (more upright sitting too) Patient Positioning:  Upright in bed (repositioned) Baseline Vocal Quality: Normal Volitional Cough: Strong Volitional Swallow: Able to elicit    Oral/Motor/Sensory Function Overall Oral Motor/Sensory Function: Within functional limits   Ice Chips Ice chips: Not tested   Thin Liquid Thin Liquid: Within functional limits Presentation: Cup;Self Fed (8+ trials)    Nectar Thick Nectar Thick Liquid: Not tested   Honey Thick Honey Thick Liquid: Not tested   Puree Puree: Within functional limits Presentation: Spoon;Self Fed (~4 ozs)   Solid     Solid: Within functional limits (soft) Presentation: Self Fed;Spoon (toast, cereal)        KOrinda Kenner MS, CSPX CorporationSpeech Language Pathologist Rehab Services [phone],Ryin Schillo 09/26/2020,11:51 AM

## 2020-09-26 NOTE — Evaluation (Signed)
Physical Therapy Evaluation Patient Details Name: Larry Callahan MRN: [id number] DOB: [birth year] Today's Date: 09/26/2020   History of Present Illness  Pt is a 65 y/o M admitted from home on 09/25/20 with c/c of R sided numbness (arm, leg & face). Pt did not receive tPA. Imaging negative for acute processes. PMH: morbid obesity, CAD, DM2 with complications of Stage 3 CKD, GERD, HTN, dyslipidemia, COPD, OSA, diastolic CHF  Clinical Impression  Pt seen for PT evaluation with pt agreeable to tx but with tangential speech & forgetting questions after not answering them during hx portion of session. Pt is mod I for all mobility with pt able to ambulate 2 laps without AD & negotiate 6 steps with L rail. No acute PT needs are identified at this time, will sign off. Please reconsult if new needs arise.     Follow Up Recommendations No PT follow up    Equipment Recommendations  None recommended by PT    Recommendations for Other Services       Precautions / Restrictions Precautions Precautions: None Restrictions Weight Bearing Restrictions: No      Mobility  Bed Mobility Overal bed mobility: Modified Independent             General bed mobility comments: HOB elevated, uses momentum to upright self for supine>sit    Transfers Overall transfer level: Modified independent                  Ambulation/Gait Ambulation/Gait assistance: Modified independent (Device/Increase time) Gait Distance (Feet): 350 Feet Assistive device: None Gait Pattern/deviations: Decreased dorsiflexion - right;Decreased weight shift to right Gait velocity: slightly decreased      Stairs Stairs: Yes Stairs assistance: Modified independent (Device/Increase time) Stair Management: One rail Left Number of Stairs: 6 General stair comments: reciprocal pattern  Wheelchair Mobility    Modified Rankin (Stroke Patients Only)       Balance Overall balance assessment: Mild deficits  observed, not formally tested   Sitting balance-Leahy Scale: Normal       Standing balance-Leahy Scale: Good                               Pertinent Vitals/Pain Pain Assessment: No/denies pain    Home Living Family/patient expects to be discharged to:: Private residence Living Arrangements: Alone Available Help at Discharge: Family;Available PRN/intermittently Type of Home: Mobile home Home Access: Stairs to enter Entrance Stairs-Rails: Left Entrance Stairs-Number of Steps: 5 Home Layout: One level Home Equipment: Cane - single point;Bedside commode;Walker - 2 wheels Additional Comments: has CPAP machine    Prior Function Level of Independence: Independent         Comments: Independent, only uses SPC PRN but doesn't explain when he needs it. Pt reports he has a home care nurse but is unable to elaborate on how often she comes by (it appears she calls on him & stops by occasionally)     Hand Dominance   Dominant Hand: Right    Extremity/Trunk Assessment   Upper Extremity Assessment Upper Extremity Assessment: Overall WFL for tasks assessed    Lower Extremity Assessment Lower Extremity Assessment: Overall WFL for tasks assessed       Communication   Communication: No difficulties (pt with tangential speech throughout)  Cognition Arousal/Alertness: Awake/alert Behavior During Therapy: WFL for tasks assessed/performed Overall Cognitive Status: Within Functional Limits for tasks assessed  General Comments: AxO x 4; pt with tangential speech throughout & will not answer PT's questions then will forget what is asked      General Comments General comments (skin integrity, edema, etc.): SpO2 >90% throughout on room air even when pt c/o slight SOB during gait    Exercises     Assessment/Plan    PT Assessment Patent does not need any further PT services  PT Problem List         PT Treatment  Interventions      PT Goals (Current goals can be found in the Care Plan section)  Acute Rehab PT Goals Patient Stated Goal: go home PT Goal Formulation: With patient Time For Goal Achievement: 10/10/20 Potential to Achieve Goals: Good    Frequency     Barriers to discharge        Co-evaluation               AM-PAC PT "6 Clicks" Mobility  Outcome Measure Help needed turning from your back to your side while in a flat bed without using bedrails?: None Help needed moving from lying on your back to sitting on the side of a flat bed without using bedrails?: None Help needed moving to and from a bed to a chair (including a wheelchair)?: None Help needed standing up from a chair using your arms (e.g., wheelchair or bedside chair)?: None Help needed to walk in hospital room?: None Help needed climbing 3-5 steps with a railing? : None 6 Click Score: 24    End of Session Equipment Utilized During Treatment: Gait belt Activity Tolerance: Patient tolerated treatment well Patient left: with bed alarm set;with call bell/phone within reach (sitting EOB) Nurse Communication: Mobility status      Time: 4076-8088 PT Time Calculation (min) (ACUTE ONLY): 12 min   Charges:   PT Evaluation $PT Eval Low Complexity: 1 Low          Lavone Nian, PT, DPT 09/26/20, 10:38 AM   Waunita Schooner 09/26/2020, 10:37 AM

## 2020-09-26 NOTE — Progress Notes (Signed)
NEUROLOGY CONSULTATION PROGRESS NOTE   Date of service: September 26, 2020 Patient Name: Larry Callahan MRN:  [id number] DOB:  February 28, 1956  Brief HPI   Brandley Aldrete Bautch is a 65 y.o. male with PMH significant for CAD, DM2, GERD, HTN, HLD, OSA, COPD, smoker in the past and quit who presents with R sided numbness with a NIHSS of 2.   Interval Hx   Went to bed and woke up and numbness was gone. Reports that PCP took him off aspirin 81mg . Gets some bruising when he is on aspirin if he bumps into anything. Denies ever having any episodes of numbness or weakness in the past with his migraine. Headache is mid, mostly resolved.   Vitals   Vitals:   09/25/20 2134 09/25/20 2354 09/26/20 0609 09/26/20 0850  BP: (!) 178/86 (!) 172/71 (!) 185/84 (!) 154/73  Pulse: 66 (!) 56 62 (!) 59  Resp: 16 16 18 17   Temp:  98.2 F (36.8 C) (!) 97.5 F (36.4 C) 97.6 F (36.4 C)  TempSrc:  Oral Oral   SpO2: 94% 97% 95% 96%  Weight:      Height:         Body mass index is 38.07 kg/m.  Physical Exam   General: Laying comfortably in bed; in no acute distress.  HENT: Normal oropharynx and mucosa. Normal external appearance of ears and nose.  Neck: Supple, no pain or tenderness  CV: No JVD. No peripheral edema.  Pulmonary: Symmetric Chest rise. Normal respiratory effort.  Abdomen: Soft to touch, non-tender.  Ext: No cyanosis, edema, or deformity  Skin: No rash. Normal palpation of skin.   Musculoskeletal: Normal digits and nails by inspection. No clubbing.   Neurologic Examination  Mental status/Cognition: Alert, oriented to self, place, month and year, good attention.  Speech/language: Fluent, comprehension intact, object naming intact, repetition intact.  Cranial nerves:   CN II Pupils equal and reactive to light, no VF deficits    CN III,IV,VI EOM intact, no gaze preference or deviation, no nystagmus    CN V normal sensation in V1, V2, and V3 segments bilaterally    CN VII no  asymmetry, no nasolabial fold flattening    CN VIII normal hearing to speech    CN IX & X normal palatal elevation, no uvular deviation    CN XI 5/5 head turn and 5/5 shoulder shrug bilaterally    CN XII midline tongue protrusion    Motor:  Muscle bulk: normal, tone normal, pronator drift none tremor none Mvmt Root Nerve  Muscle Right Left Comments  SA C5/6 Ax Deltoid 5 5   EF C5/6 Mc Biceps 5 5   EE C6/7/8 Rad Triceps 5 5   WF C6/7 Med FCR     WE C7/8 PIN ECU     F Ab C8/T1 U ADM/FDI 5 5   HF L1/2/3 Fem Illopsoas 5 5   KE L2/3/4 Fem Quad 5 5   DF L4/5 D Peron Tib Ant 5 5   PF S1/2 Tibial Grc/Sol 5 5    Reflexes:  Right Left Comments  Pectoralis      Biceps (C5/6) 2 2   Brachioradialis (C5/6) 2 2    Triceps (C6/7) 2 2    Patellar (L3/4) 2 2    Achilles (S1)      Hoffman      Plantar     Jaw jerk    Sensation:  Light touch intact   Pin prick  Temperature    Vibration   Proprioception    Coordination/Complex Motor:  - Finger to Nose intact - Heel to shin intact - Rapid alternating movement are normal - Gait: deferred.  Labs   Basic Metabolic Panel:  Lab Results  Component Value Date   NA 139 09/26/2020   K 4.1 09/26/2020   CO2 21 (L) 09/26/2020   GLUCOSE 130 (H) 09/26/2020   BUN 46 (H) 09/26/2020   CREATININE 2.13 (H) 09/26/2020   CALCIUM 8.7 (L) 09/26/2020   GFRNONAA 33 (L) 09/26/2020   GFRAA 51 (L) 02/18/2020   HbA1c:  Lab Results  Component Value Date   HGBA1C 7.1 (A) 07/17/2020   LDL:  Lab Results  Component Value Date   LDLCALC 72 09/26/2020   Urine Drug Screen:     Component Value Date/Time   LABOPIA NONE DETECTED 09/25/2020 1643   COCAINSCRNUR NONE DETECTED 09/25/2020 1643   LABBENZ NONE DETECTED 09/25/2020 1643   AMPHETMU NONE DETECTED 09/25/2020 1643   THCU NONE DETECTED 09/25/2020 1643   LABBARB NONE DETECTED 09/25/2020 1643    Alcohol Level     Component Value Date/Time   ETH <10 09/25/2020 1412   No results found for:  PHENYTOIN, ZONISAMIDE, LAMOTRIGINE, LEVETIRACETA No results found for: PHENYTOIN, PHENOBARB, VALPROATE, CBMZ  Imaging and Diagnostic studies  Results for orders placed during the hospital encounter of 09/25/20  ECHOCARDIOGRAM COMPLETE 1. Left ventricular ejection fraction, by estimation, is 35 to 40%. The left ventricle has moderately decreased function. The left ventricle demonstrates global hypokinesis. The left ventricular internal cavity size was mildly dilated. Left ventricular diastolic parameters were normal. 2. Right ventricular systolic function is normal. The right ventricular size is normal. 3. The mitral valve is normal in structure. Mild mitral valve regurgitation. 4. The aortic valve is normal in structure. Aortic valve regurgitation is not visualized.   MRI Brain: No acute intracranial abnormality. Normal for age noncontrast MRI appearance of the brain.   MR Angio head w/o contrast: Negative intracranial MRA.  US carotid duplex: No significant stenosis of internal carotid arteries.  Impression   Larry Callahan is a 65 y.o. male with PMH significant for CAD, DM2, GERD, HTN, HLD, OSA, COPD, smoker in the past and quit who presents with R sided numbness with a NIHSS of 2.  His symptoms resolved several hours later. NIHSS is now a 0.  Never had anything like this in the past with his migraine. Suspect this was a TIA.   Recommendations  - Aspirin 81mg  daily - Follow up with PCP for Diabetes control. - Follow up with stroke clinic in 2-3 months. ______________________________________________________________________   Thank you for the opportunity to take part in the care of this patient. If you have any further questions, please contact the neurology consultation attending.  Signed,  [place] Pager Number [phone]

## 2020-09-26 NOTE — Progress Notes (Signed)
*  PRELIMINARY RESULTS* Echocardiogram 2D Echocardiogram has been performed. Definity IV Contrast was used on this study.  Claretta Fraise 09/26/2020, 7:51 AM

## 2020-09-28 LAB — HEMOGLOBIN A1C
Hgb A1c MFr Bld: 7.7 % — ABNORMAL HIGH (ref 4.8–5.6)
Mean Plasma Glucose: 174 mg/dL

## 2020-10-02 IMAGING — DX DG CHEST 1V PORT
1 series · 1 of 1 positions shown · non-contrast
Comparison: Earlier chest radiograph dated 02/20/2019.

CLINICAL DATA: 63-year-old male with shortness of breath.

EXAM:
PORTABLE CHEST 1 VIEW

[chest ap]
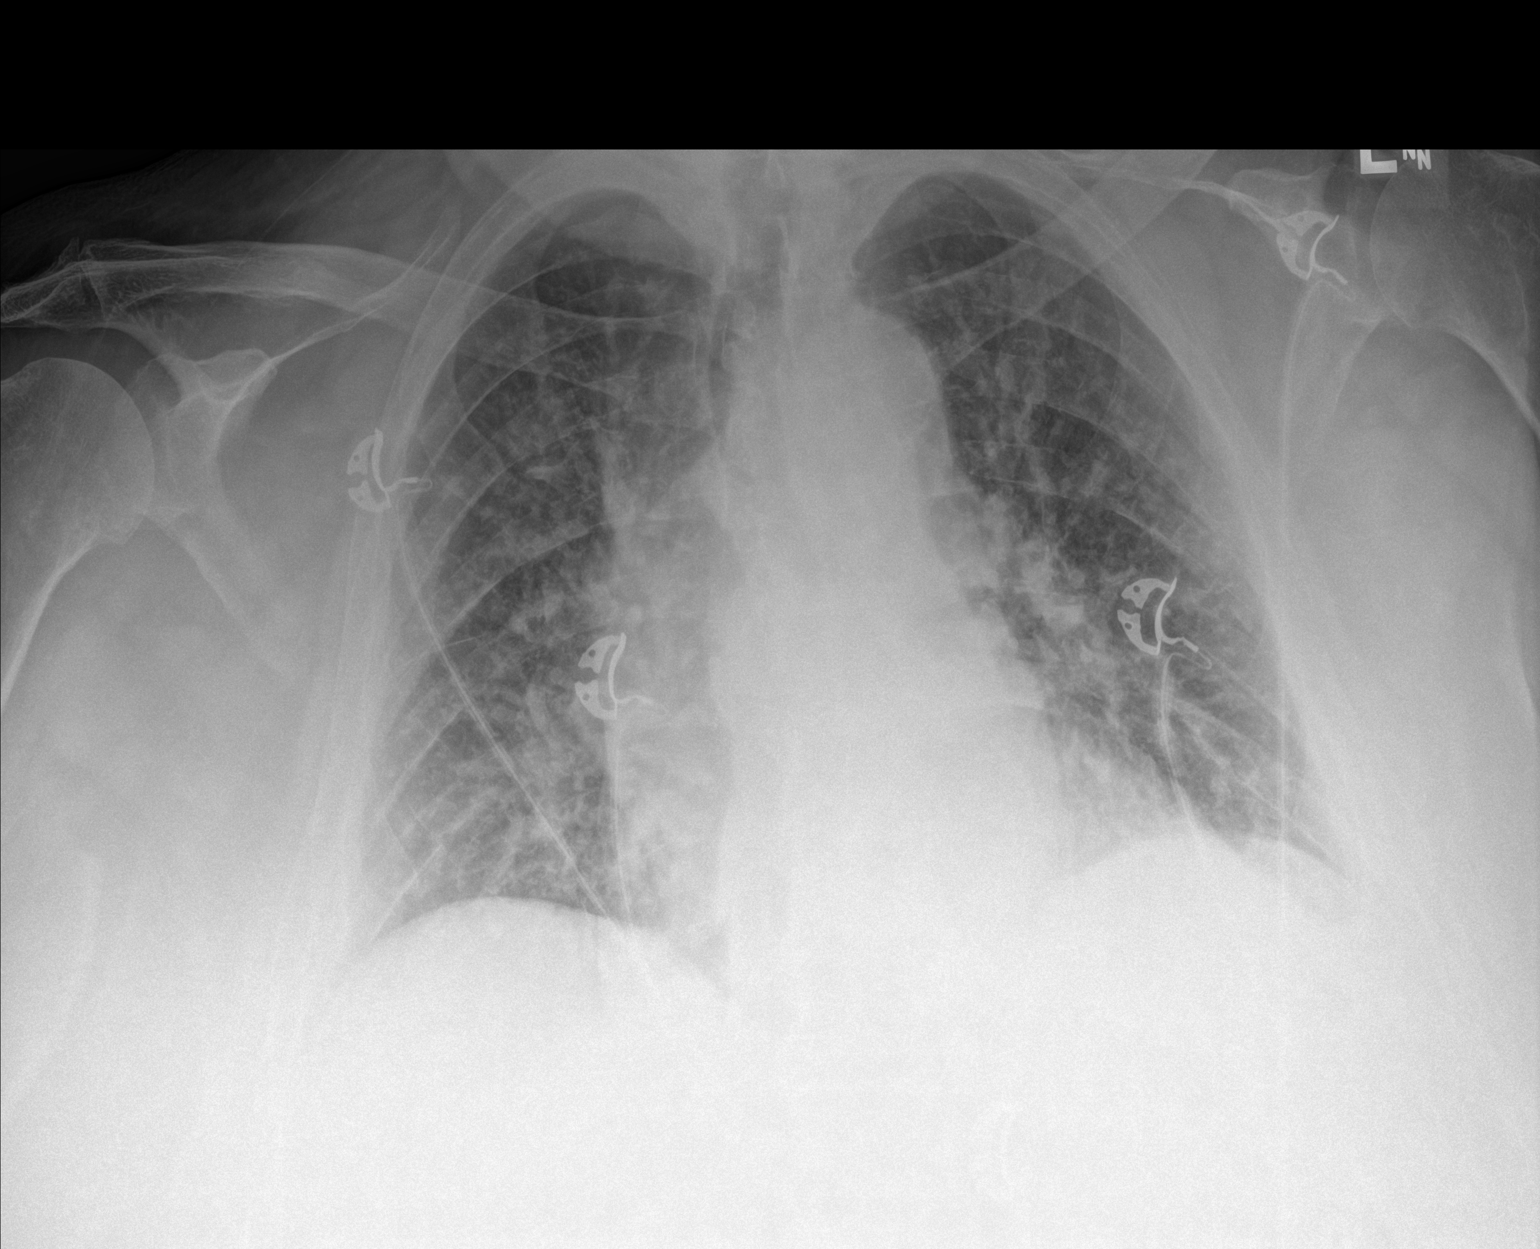

[1 of 1 positions shown; findings below may reference images not displayed]

FINDINGS: There is mild cardiomegaly with vascular congestion, progressed
since the prior radiograph. No focal consolidation, pleural
effusion, or pneumothorax. No acute osseous pathology.
IMPRESSION: Mild cardiomegaly with vascular congestion, worsened since the
earlier radiograph. No focal consolidation.

## 2020-10-02 IMAGING — CR DG CHEST 2V
1 series · 2 of 2 positions shown · non-contrast
Comparison: 12/29/2018

CLINICAL DATA: Shortness of breath

EXAM:
CHEST - 2 VIEW

[Series 1: dg chest 2 view · 0.14mm/px · 2 of 2 slices shown]
[im 1/2]
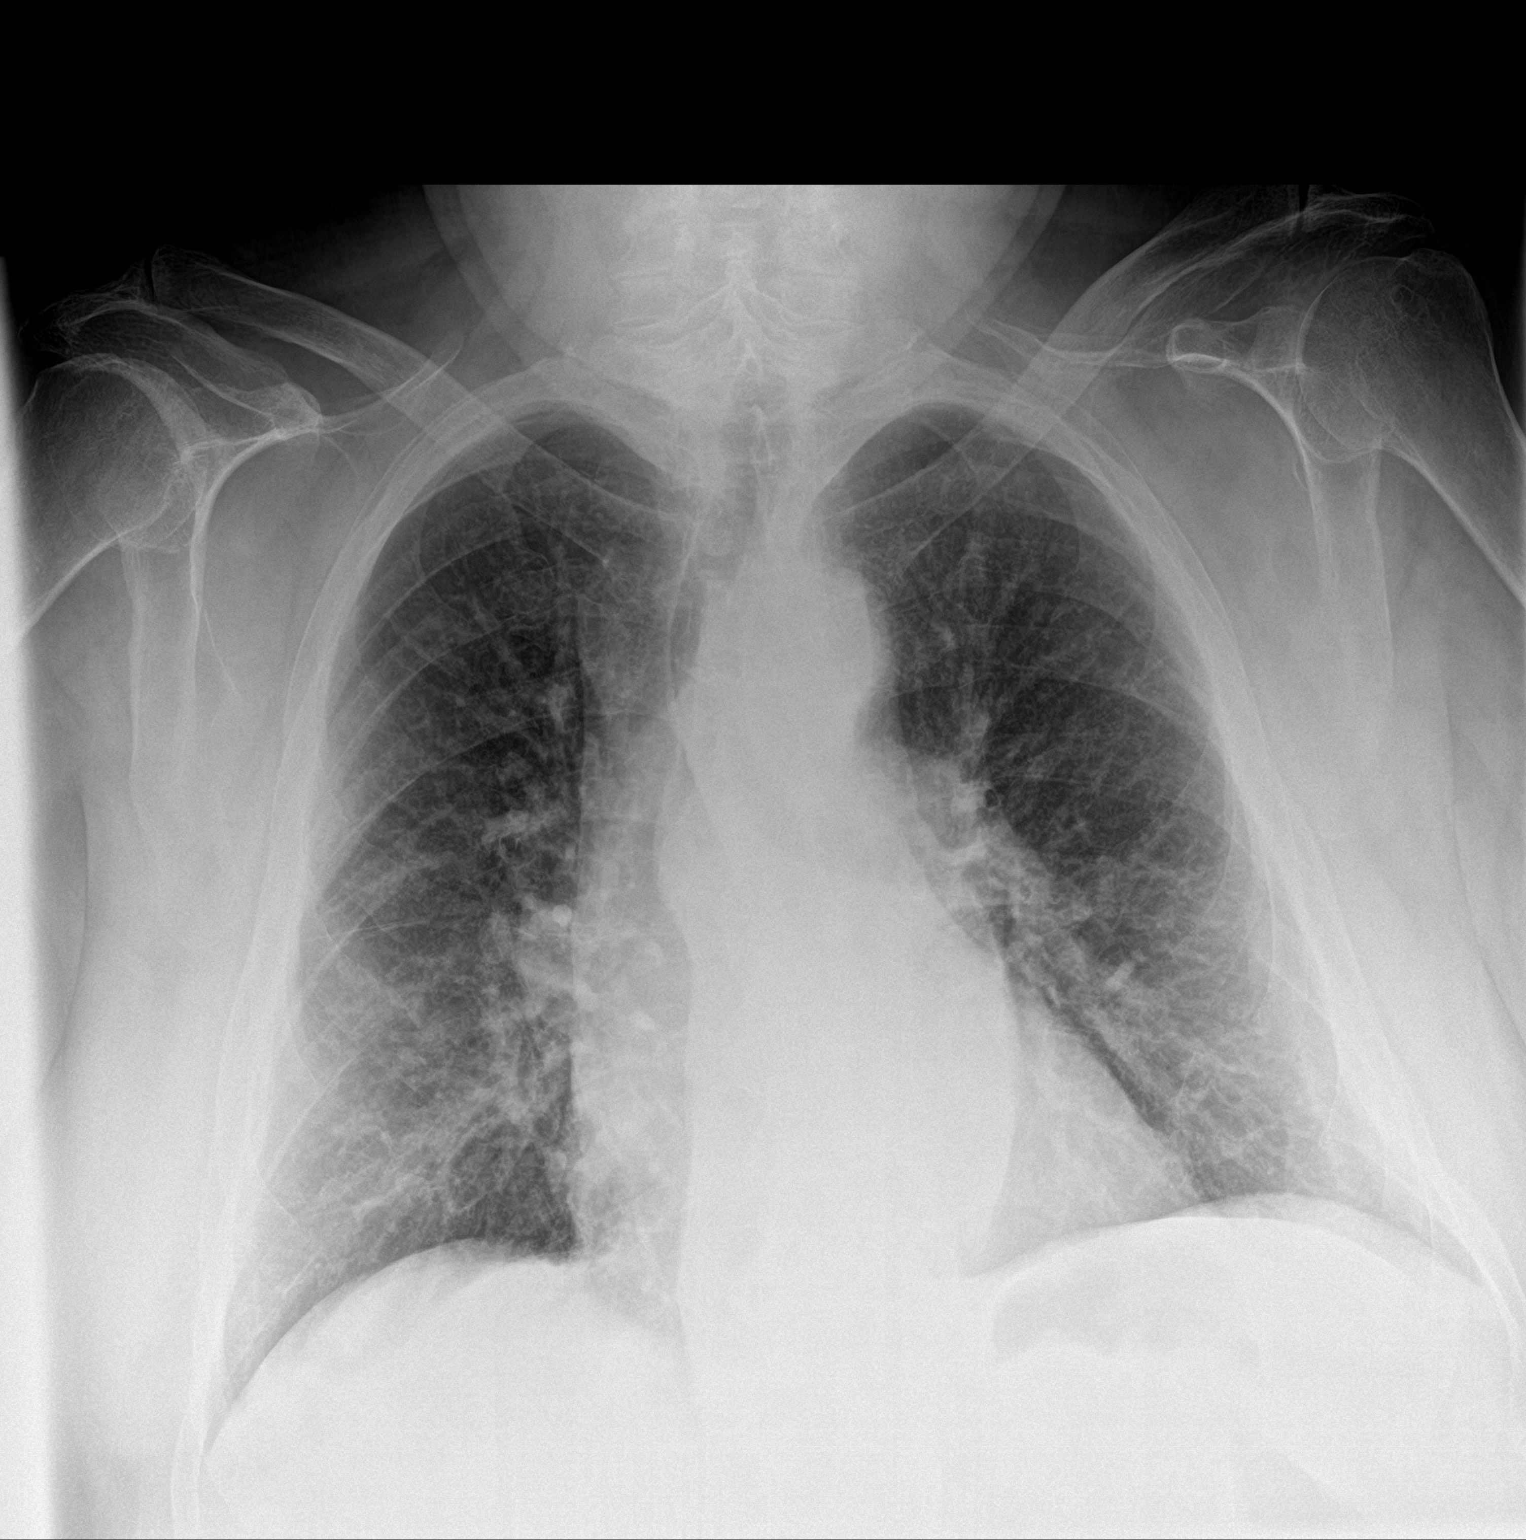
[im 2/2]
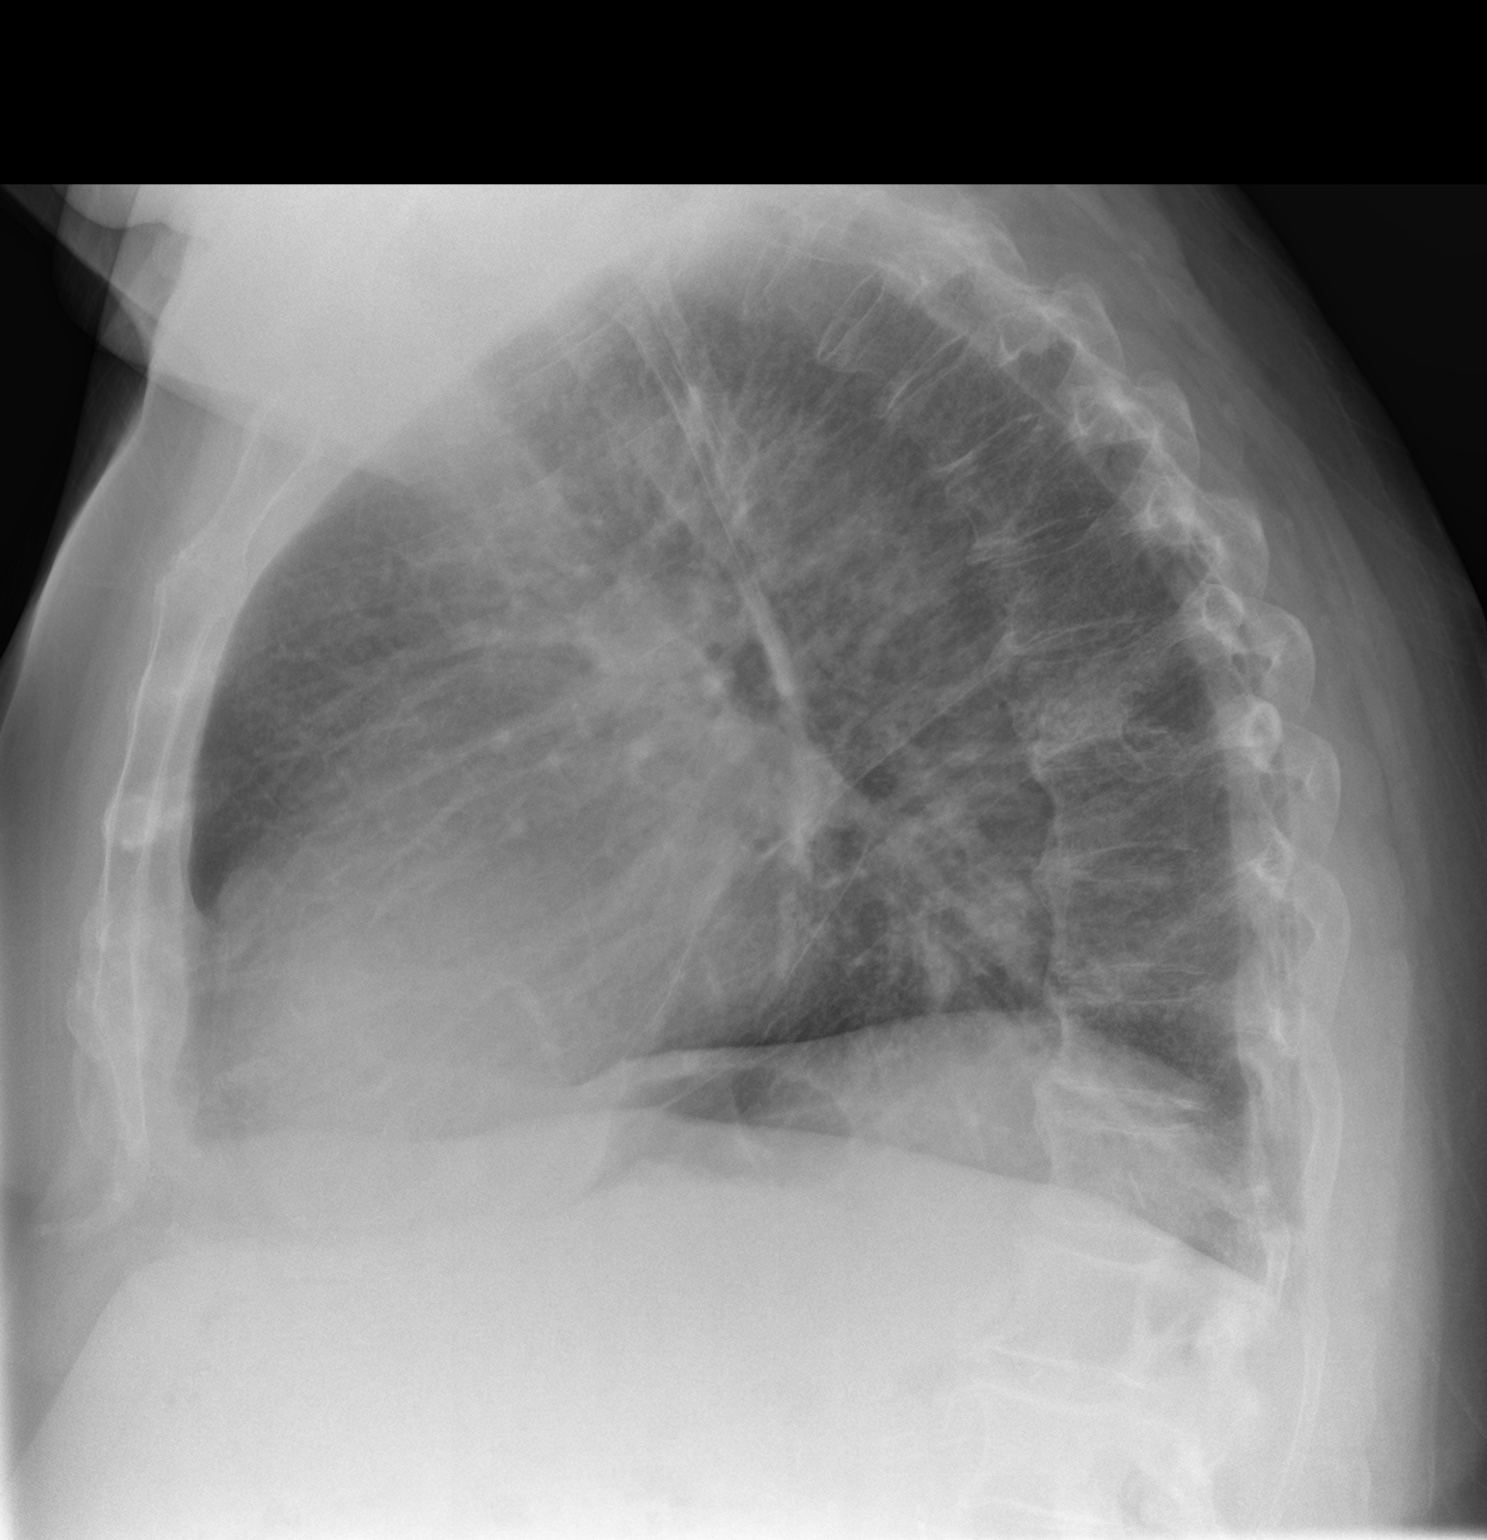

[2 of 2 positions shown; findings below may reference images not displayed]

FINDINGS: Cardiac shadow is stable. The lungs are well aerated bilaterally. No
focal infiltrate or sizable effusion is seen. Degenerative changes
of the thoracic spine are noted.
IMPRESSION: No active cardiopulmonary disease.

## 2020-10-06 ENCOUNTER — Other Ambulatory Visit: Payer: Self-pay

## 2020-10-06 ENCOUNTER — Ambulatory Visit (INDEPENDENT_AMBULATORY_CARE_PROVIDER_SITE_OTHER): Payer: Medicare PPO | Admitting: Dermatology

## 2020-10-06 ENCOUNTER — Inpatient Hospital Stay
Admission: EM | Admit: 2020-10-06 | Discharge: 2020-10-08 | DRG: 303 | Disposition: A | Discharge status: Home / Self Care | Payer: Medicare PPO | Attending: Internal Medicine | Admitting: Internal Medicine

## 2020-10-06 ENCOUNTER — Encounter: Payer: Self-pay | Admitting: Internal Medicine

## 2020-10-06 ENCOUNTER — Emergency Department: Payer: Medicare PPO

## 2020-10-06 DIAGNOSIS — Z801 Family history of malignant neoplasm of trachea, bronchus and lung: Secondary | ICD-10-CM

## 2020-10-06 DIAGNOSIS — Z880 Allergy status to penicillin: Secondary | ICD-10-CM | POA: Diagnosis not present

## 2020-10-06 DIAGNOSIS — Z8616 Personal history of COVID-19: Secondary | ICD-10-CM | POA: Diagnosis not present

## 2020-10-06 DIAGNOSIS — L720 Epidermal cyst: Secondary | ICD-10-CM | POA: Diagnosis not present

## 2020-10-06 DIAGNOSIS — E1122 Type 2 diabetes mellitus with diabetic chronic kidney disease: Secondary | ICD-10-CM | POA: Diagnosis not present

## 2020-10-06 DIAGNOSIS — I10 Essential (primary) hypertension: Secondary | ICD-10-CM | POA: Diagnosis present

## 2020-10-06 DIAGNOSIS — I25118 Atherosclerotic heart disease of native coronary artery with other forms of angina pectoris: Principal | ICD-10-CM | POA: Diagnosis present

## 2020-10-06 DIAGNOSIS — Z20822 Contact with and (suspected) exposure to covid-19: Secondary | ICD-10-CM | POA: Diagnosis present

## 2020-10-06 DIAGNOSIS — Z7951 Long term (current) use of inhaled steroids: Secondary | ICD-10-CM | POA: Diagnosis not present

## 2020-10-06 DIAGNOSIS — I5022 Chronic systolic (congestive) heart failure: Secondary | ICD-10-CM | POA: Diagnosis present

## 2020-10-06 DIAGNOSIS — D492 Neoplasm of unspecified behavior of bone, soft tissue, and skin: Secondary | ICD-10-CM

## 2020-10-06 DIAGNOSIS — N1832 Chronic kidney disease, stage 3b: Secondary | ICD-10-CM | POA: Diagnosis not present

## 2020-10-06 DIAGNOSIS — R0789 Other chest pain: Secondary | ICD-10-CM | POA: Diagnosis not present

## 2020-10-06 DIAGNOSIS — I200 Unstable angina: Secondary | ICD-10-CM | POA: Diagnosis not present

## 2020-10-06 DIAGNOSIS — Z794 Long term (current) use of insulin: Secondary | ICD-10-CM

## 2020-10-06 DIAGNOSIS — J449 Chronic obstructive pulmonary disease, unspecified: Secondary | ICD-10-CM | POA: Diagnosis present

## 2020-10-06 DIAGNOSIS — I447 Left bundle-branch block, unspecified: Secondary | ICD-10-CM | POA: Diagnosis not present

## 2020-10-06 DIAGNOSIS — L918 Other hypertrophic disorders of the skin: Secondary | ICD-10-CM

## 2020-10-06 DIAGNOSIS — E119 Type 2 diabetes mellitus without complications: Secondary | ICD-10-CM

## 2020-10-06 DIAGNOSIS — D509 Iron deficiency anemia, unspecified: Secondary | ICD-10-CM | POA: Diagnosis present

## 2020-10-06 DIAGNOSIS — R7989 Other specified abnormal findings of blood chemistry: Secondary | ICD-10-CM | POA: Diagnosis present

## 2020-10-06 DIAGNOSIS — I639 Cerebral infarction, unspecified: Secondary | ICD-10-CM | POA: Diagnosis present

## 2020-10-06 DIAGNOSIS — E782 Mixed hyperlipidemia: Secondary | ICD-10-CM | POA: Diagnosis not present

## 2020-10-06 DIAGNOSIS — Z6838 Body mass index (BMI) 38.0-38.9, adult: Secondary | ICD-10-CM

## 2020-10-06 DIAGNOSIS — D631 Anemia in chronic kidney disease: Secondary | ICD-10-CM | POA: Diagnosis present

## 2020-10-06 DIAGNOSIS — Z833 Family history of diabetes mellitus: Secondary | ICD-10-CM

## 2020-10-06 DIAGNOSIS — K219 Gastro-esophageal reflux disease without esophagitis: Secondary | ICD-10-CM | POA: Diagnosis present

## 2020-10-06 DIAGNOSIS — T300 Burn of unspecified body region, unspecified degree: Secondary | ICD-10-CM | POA: Diagnosis not present

## 2020-10-06 DIAGNOSIS — E785 Hyperlipidemia, unspecified: Secondary | ICD-10-CM | POA: Diagnosis present

## 2020-10-06 DIAGNOSIS — I491 Atrial premature depolarization: Secondary | ICD-10-CM | POA: Diagnosis not present

## 2020-10-06 DIAGNOSIS — Z7982 Long term (current) use of aspirin: Secondary | ICD-10-CM

## 2020-10-06 DIAGNOSIS — Z87891 Personal history of nicotine dependence: Secondary | ICD-10-CM

## 2020-10-06 DIAGNOSIS — I160 Hypertensive urgency: Secondary | ICD-10-CM | POA: Diagnosis present

## 2020-10-06 DIAGNOSIS — D485 Neoplasm of uncertain behavior of skin: Secondary | ICD-10-CM

## 2020-10-06 DIAGNOSIS — R079 Chest pain, unspecified: Secondary | ICD-10-CM | POA: Diagnosis present

## 2020-10-06 DIAGNOSIS — I429 Cardiomyopathy, unspecified: Secondary | ICD-10-CM | POA: Diagnosis present

## 2020-10-06 DIAGNOSIS — Z8249 Family history of ischemic heart disease and other diseases of the circulatory system: Secondary | ICD-10-CM

## 2020-10-06 DIAGNOSIS — G4733 Obstructive sleep apnea (adult) (pediatric): Secondary | ICD-10-CM | POA: Diagnosis present

## 2020-10-06 DIAGNOSIS — I130 Hypertensive heart and chronic kidney disease with heart failure and stage 1 through stage 4 chronic kidney disease, or unspecified chronic kidney disease: Secondary | ICD-10-CM | POA: Diagnosis present

## 2020-10-06 DIAGNOSIS — Z8673 Personal history of transient ischemic attack (TIA), and cerebral infarction without residual deficits: Secondary | ICD-10-CM | POA: Diagnosis present

## 2020-10-06 DIAGNOSIS — L080 Pyoderma: Secondary | ICD-10-CM | POA: Diagnosis not present

## 2020-10-06 DIAGNOSIS — Z79899 Other long term (current) drug therapy: Secondary | ICD-10-CM

## 2020-10-06 DIAGNOSIS — R778 Other specified abnormalities of plasma proteins: Secondary | ICD-10-CM | POA: Diagnosis present

## 2020-10-06 DIAGNOSIS — Z5320 Procedure and treatment not carried out because of patient's decision for unspecified reasons: Secondary | ICD-10-CM | POA: Diagnosis not present

## 2020-10-06 LAB — COMPREHENSIVE METABOLIC PANEL
ALT: 18 U/L (ref 0–44)
AST: 21 U/L (ref 15–41)
Albumin: 3.2 g/dL — ABNORMAL LOW (ref 3.5–5.0)
Alkaline Phosphatase: 81 U/L (ref 38–126)
Anion gap: 9 (ref 5–15)
BUN: 38 mg/dL — ABNORMAL HIGH (ref 8–23)
CO2: 20 mmol/L — ABNORMAL LOW (ref 22–32)
Calcium: 9.1 mg/dL (ref 8.9–10.3)
Chloride: 109 mmol/L (ref 98–111)
Creatinine, Ser: 2.1 mg/dL — ABNORMAL HIGH (ref 0.61–1.24)
GFR, Estimated: 34 mL/min — ABNORMAL LOW (ref >60)
Glucose, Bld: 115 mg/dL — ABNORMAL HIGH (ref 70–99)
Potassium: 4 mmol/L (ref 3.5–5.1)
Sodium: 138 mmol/L (ref 135–145)
Total Bilirubin: 0.4 mg/dL (ref 0.3–1.2)
Total Protein: 7.1 g/dL (ref 6.5–8.1)

## 2020-10-06 LAB — TSH: TSH: 1.561 u[IU]/mL (ref 0.350–4.500)

## 2020-10-06 LAB — CBC
HCT: 32.9 % — ABNORMAL LOW (ref 39.0–52.0)
Hemoglobin: 10.7 g/dL — ABNORMAL LOW (ref 13.0–17.0)
MCH: 28.7 pg (ref 26.0–34.0)
MCHC: 32.5 g/dL (ref 30.0–36.0)
MCV: 88.2 fL (ref 80.0–100.0)
Platelets: 173 10*3/uL (ref 150–400)
RBC: 3.73 MIL/uL — ABNORMAL LOW (ref 4.22–5.81)
RDW: 13.7 % (ref 11.5–15.5)
WBC: 6.5 10*3/uL (ref 4.0–10.5)
nRBC: 0 % (ref 0.0–0.2)

## 2020-10-06 LAB — RESP PANEL BY RT-PCR (FLU A&B, COVID) ARPGX2
Influenza A by PCR: NEGATIVE
Influenza B by PCR: NEGATIVE
SARS Coronavirus 2 by RT PCR: NEGATIVE

## 2020-10-06 LAB — MAGNESIUM: Magnesium: 1.9 mg/dL (ref 1.7–2.4)

## 2020-10-06 LAB — HEMOGLOBIN A1C
Hgb A1c MFr Bld: 7.6 % — ABNORMAL HIGH (ref 4.8–5.6)
Mean Plasma Glucose: 171.42 mg/dL

## 2020-10-06 LAB — PROTIME-INR
INR: 1.1 (ref 0.8–1.2)
Prothrombin Time: 14 seconds (ref 11.4–15.2)

## 2020-10-06 LAB — APTT: aPTT: 140 seconds — ABNORMAL HIGH (ref 24–36)

## 2020-10-06 LAB — TROPONIN I (HIGH SENSITIVITY)
Troponin I (High Sensitivity): 23 ng/L — ABNORMAL HIGH (ref <18)
Troponin I (High Sensitivity): 26 ng/L — ABNORMAL HIGH (ref <18)
Troponin I (High Sensitivity): 26 ng/L — ABNORMAL HIGH (ref <18)
Troponin I (High Sensitivity): 25 ng/L — ABNORMAL HIGH (ref <18)

## 2020-10-06 LAB — HEPARIN LEVEL (UNFRACTIONATED): Heparin Unfractionated: 0.2 IU/mL — ABNORMAL LOW (ref 0.30–0.70)

## 2020-10-06 LAB — BRAIN NATRIURETIC PEPTIDE: B Natriuretic Peptide: 81.7 pg/mL (ref 0.0–100.0)

## 2020-10-06 LAB — GLUCOSE, CAPILLARY: Glucose-Capillary: 241 mg/dL — ABNORMAL HIGH (ref 70–99)

## 2020-10-06 MED ORDER — NITROGLYCERIN 2 % TD OINT: 1.0000 [in_us] | TOPICAL_OINTMENT | Freq: Once | TRANSDERMAL | Status: DC

## 2020-10-06 MED ORDER — MUPIROCIN 2 % EX OINT: 1.0000 "application " | TOPICAL_OINTMENT | Freq: Every day | CUTANEOUS | 1 refills | Status: DC

## 2020-10-06 MED ORDER — IPRATROPIUM-ALBUTEROL 0.5-2.5 (3) MG/3ML IN SOLN
3.0000 mL | Freq: Once | RESPIRATORY_TRACT | Status: AC
  Administered 2020-10-06: 3 mL via RESPIRATORY_TRACT
  Filled 2020-10-06: qty 3

## 2020-10-06 MED ORDER — INSULIN ASPART 100 UNIT/ML ~~LOC~~ SOLN
0.0000 [IU] | Freq: Three times a day (TID) | SUBCUTANEOUS | Status: DC
  Administered 2020-10-07: 1 [IU] via SUBCUTANEOUS
  Administered 2020-10-07: 5 [IU] via SUBCUTANEOUS
  Administered 2020-10-08: 2 [IU] via SUBCUTANEOUS
  Administered 2020-10-08: 7 [IU] via SUBCUTANEOUS
  Filled 2020-10-06 (×4): qty 1

## 2020-10-06 MED ORDER — HEPARIN BOLUS VIA INFUSION
4000.0000 [IU] | Freq: Once | INTRAVENOUS | Status: AC
  Administered 2020-10-06: 4000 [IU] via INTRAVENOUS
  Filled 2020-10-06: qty 4000

## 2020-10-06 MED ORDER — HEPARIN BOLUS VIA INFUSION
1500.0000 [IU] | Freq: Once | INTRAVENOUS | Status: AC
  Administered 2020-10-06: 1500 [IU] via INTRAVENOUS
  Filled 2020-10-06: qty 1500

## 2020-10-06 MED ORDER — INSULIN ASPART 100 UNIT/ML ~~LOC~~ SOLN
0.0000 [IU] | Freq: Every day | SUBCUTANEOUS | Status: DC
  Administered 2020-10-06 – 2020-10-07 (×2): 2 [IU] via SUBCUTANEOUS
  Filled 2020-10-06 (×2): qty 1

## 2020-10-06 MED ORDER — ACETAMINOPHEN 325 MG PO TABS
650.0000 mg | ORAL_TABLET | Freq: Once | ORAL | Status: AC
  Administered 2020-10-06: 650 mg via ORAL
  Filled 2020-10-06: qty 2

## 2020-10-06 MED ORDER — DM-GUAIFENESIN ER 30-600 MG PO TB12: 1.0000 | ORAL_TABLET | Freq: Two times a day (BID) | ORAL | Status: DC | PRN

## 2020-10-06 MED ORDER — MORPHINE SULFATE (PF) 2 MG/ML IV SOLN: 2.0000 mg | INTRAVENOUS | Status: DC | PRN

## 2020-10-06 MED ORDER — HEPARIN (PORCINE) 25000 UT/250ML-% IV SOLN
1750.0000 [IU]/h | INTRAVENOUS | Status: DC
  Administered 2020-10-06: 1350 [IU]/h via INTRAVENOUS
  Administered 2020-10-07 (×2): 1750 [IU]/h via INTRAVENOUS
  Filled 2020-10-06 (×3): qty 250

## 2020-10-06 MED ORDER — ROSUVASTATIN CALCIUM 5 MG PO TABS
5.0000 mg | ORAL_TABLET | Freq: Every day | ORAL | Status: DC
  Administered 2020-10-07: 5 mg via ORAL
  Filled 2020-10-06: qty 1

## 2020-10-06 MED ORDER — ACETAMINOPHEN 325 MG PO TABS: 650.0000 mg | ORAL_TABLET | Freq: Four times a day (QID) | ORAL | Status: DC | PRN

## 2020-10-06 MED ORDER — HYDRALAZINE HCL 20 MG/ML IJ SOLN
5.0000 mg | INTRAMUSCULAR | Status: DC | PRN
  Administered 2020-10-07: 5 mg via INTRAVENOUS
  Filled 2020-10-06: qty 1

## 2020-10-06 MED ORDER — LABETALOL HCL 5 MG/ML IV SOLN
5.0000 mg | Freq: Once | INTRAVENOUS | Status: AC
  Administered 2020-10-06: 5 mg via INTRAVENOUS
  Filled 2020-10-06: qty 4

## 2020-10-06 MED ORDER — NITROGLYCERIN 0.4 MG SL SUBL: 0.4000 mg | SUBLINGUAL_TABLET | SUBLINGUAL | Status: DC | PRN

## 2020-10-06 MED ORDER — ALBUTEROL SULFATE HFA 108 (90 BASE) MCG/ACT IN AERS
2.0000 | INHALATION_SPRAY | RESPIRATORY_TRACT | Status: DC | PRN
  Filled 2020-10-06: qty 6.7

## 2020-10-06 MED ORDER — METOPROLOL TARTRATE 25 MG PO TABS
12.5000 mg | ORAL_TABLET | Freq: Once | ORAL | Status: DC
  Filled 2020-10-06: qty 1

## 2020-10-06 MED ORDER — HYDROXYZINE HCL 50 MG/ML IM SOLN
25.0000 mg | Freq: Four times a day (QID) | INTRAMUSCULAR | Status: DC | PRN
  Filled 2020-10-06: qty 0.5

## 2020-10-06 MED ORDER — METOPROLOL TARTRATE 25 MG PO TABS
25.0000 mg | ORAL_TABLET | Freq: Two times a day (BID) | ORAL | Status: DC
  Administered 2020-10-06 – 2020-10-07 (×2): 25 mg via ORAL
  Filled 2020-10-06 (×2): qty 1

## 2020-10-06 NOTE — ED Provider Notes (Signed)
[facility] Emergency Department Provider Note    Event Date/Time   First MD Initiated Contact with Patient 10/06/20 1350     (approximate)  I have reviewed the triage vital signs and the nursing notes.   HISTORY  Chief Complaint Chest Pain    HPI Larry Callahan is a 65 y.o. male with the below listed past medical history presents to the ER for evaluation of chest pain that started this morning.  Patient went to dermatology appointment and told him that his chest discomfort he was directed to the ER.  Describes it as "burning like a pepper ".  States it feels different than indigestion.  No diaphoresis.   Denies any pain radiating through to his back or neck or jaw.   Past Medical History:  Diagnosis Date  . Allergy   . Coronary artery disease   . Diabetes mellitus without complication ([place])   . Diastolic CHF ([place])   . GERD (gastroesophageal reflux disease)   . Hyperlipidemia   . Hypertension   . Renal insufficiency   . Sleep apnea    Family History  Problem Relation Age of Onset  . Diabetes Mother        2003  . Lung cancer Father   . Diabetes Sister   . Hypertension Sister   . Heart disease Sister   . Cancer Sister   . Bone cancer Brother    Past Surgical History:  Procedure Laterality Date  . BACK SURGERY    . CARDIAC CATHETERIZATION    . CHOLECYSTECTOMY    . COLONOSCOPY WITH PROPOFOL N/A 04/12/2019   Procedure: COLONOSCOPY WITH PROPOFOL;  Surgeon: [name], MD;  Location: [facility] ENDOSCOPY;  Service: Gastroenterology;  Laterality: N/A;  . ESOPHAGOGASTRODUODENOSCOPY N/A 04/12/2019   Procedure: ESOPHAGOGASTRODUODENOSCOPY (EGD);  Surgeon: [name], MD;  Location: [facility] ENDOSCOPY;  Service: Gastroenterology;  Laterality: N/A;  . ESOPHAGOGASTRODUODENOSCOPY (EGD) WITH PROPOFOL N/A 11/21/2019   Procedure: ESOPHAGOGASTRODUODENOSCOPY (EGD) WITH PROPOFOL;  Surgeon: [name], MD;  Location: [facility] ENDOSCOPY;   Service: Gastroenterology;  Laterality: N/A;  . FLEXIBLE BRONCHOSCOPY Bilateral 05/17/2019   Procedure: FLEXIBLE BRONCHOSCOPY;  Surgeon: [name], MD;  Location: [name] ORS;  Service: Pulmonary;  Laterality: Bilateral;  . left arm surgery    . nephrectomy Left   . PARATHYROIDECTOMY    . RIGHT HEART CATH N/A 03/29/2019   Procedure: RIGHT HEART CATH;  Surgeon: [name], MD;  Location: [place] CV LAB;  Service: Cardiovascular;  Laterality: N/A;   Patient Active Problem List   Diagnosis Date Noted  . CVA (cerebral vascular accident) ([place]) 09/25/2020  . Gastroesophageal reflux disease without esophagitis 04/19/2020  . Pneumonia due to COVID-19 virus 01/30/2020  . Elevated troponin 01/29/2020  . Suspected COVID-19 virus infection 01/29/2020  . Postural dizziness with presyncope 01/29/2020  . Hematemesis 01/09/2020  . Hospital discharge follow-up 11/26/2019  . Cellulitis of left hand 09/26/2019  . Generalized weakness 08/08/2019  . Obesity (BMI 30-39.9) 08/08/2019  . Acute right hip pain 06/17/2019  . Inability to ambulate due to hip 06/17/2019  . CKD stage 3 due to type 2 diabetes mellitus ([place])   . Hypervolemia   . Acute on chronic diastolic CHF (congestive heart failure) ([place])   . Type 2 diabetes mellitus with stage 3b chronic kidney disease, with long-term current use of insulin ([place])   . Hypomagnesemia   . Full code status 05/20/2019  . Pleuritic chest pain   . Multifocal pneumonia  05/19/2019  . Acute kidney injury superimposed on CKD 3b ([place]) 05/09/2019  . Hemoptysis 04/09/2019  . Shortness of breath 03/26/2019  . Uncontrolled type 2 diabetes mellitus with insulin therapy ([place]) 03/12/2019  . Acute on chronic heart failure with preserved ejection fraction (HFpEF) ([place])   . Chronic kidney disease, stage 3b ([place])   . Chronic systolic CHF (congestive heart failure) ([place]) 03/01/2019  . Diastolic dysfunction 25/63/8937  . Iron deficiency anemia 12/31/2018  . Atopic  dermatitis 11/24/2018  . Screening for colon cancer 11/06/2017  . Uncontrolled type 2 diabetes mellitus with hyperglycemia ([place]) 11/06/2017  . Hidradenitis 06/09/2017  . Atherosclerotic heart disease of native coronary artery without angina pectoris 06/09/2017  . 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 Obstructive sleep apnea, adult 06/09/2017  . Morbid (severe) obesity due to excess calories ([place]) 06/09/2017  . Cigarette nicotine dependence with nicotine-induced disorder 06/09/2017  . Diabetic polyneuropathy associated with type 2 diabetes mellitus ([place]) 06/09/2017  . Allergic rhinitis due to pollen 06/09/2017  . Mixed hyperlipidemia 06/09/2017  . COPD (chronic obstructive pulmonary disease) ([place]) 06/09/2017  . Dyspnea on exertion 06/09/2017  . Wheezing 06/09/2017  . Dysuria 06/09/2017  . Snoring 06/09/2017  . Essential hypertension 06/09/2017  . Personal history of other malignant neoplasm of kidney 06/09/2017  . Pain in right hip 06/09/2017  . Impacted cerumen, bilateral 06/09/2017  . Secondary hyperparathyroidism, not elsewhere classified ([place]) 06/09/2017  . Type 2 diabetes mellitus with hyperglycemia ([place]) 06/09/2017  . Tinea corporis 06/09/2017      Prior to Admission medications   Medication Sig Start Date End Date Taking? Authorizing Provider  mupirocin ointment (BACTROBAN) 2 % Apply 1 application topically daily. Qd to excision site 10/06/20   Ralene Bathe, MD  acetaminophen (TYLENOL) 325 MG tablet Take 2 tablets (650 mg total) by mouth every 4 (four) hours as needed for headache or mild pain. Patient not taking: No sig reported 03/05/19   Nicholes Mango, MD  albuterol (VENTOLIN HFA) 108 (90 Base) MCG/ACT inhaler Inhale 2 puffs into the lungs every 6 (six) hours as needed for wheezing or shortness of breath. 08/31/20   Lavera Guise, MD  ascorbic acid (VITAMIN C) 500 MG tablet Take 1 tablet (500 mg total) by mouth daily. Patient not  taking: No sig reported 02/04/20   Oswald Hillock, MD  aspirin EC 81 MG tablet Take 1 tablet (81 mg total) by mouth daily. Swallow whole. 09/26/20 09/26/21  Nita Sells, MD  Blood Glucose Calibration (TRUE METRIX LEVEL 1) Low SOLN Use as directed 04/04/18   [name], NP  Blood Glucose Monitoring Suppl (TRUE METRIX AIR GLUCOSE METER) DEVI 1 Device by Does not apply route 2 (two) times daily. 04/13/18   [name], NP  budesonide-formoterol (SYMBICORT) 160-4.5 MCG/ACT inhaler Inhale 2 puffs into the lungs 2 (two) times daily. 07/20/20   Lavera Guise, MD  escitalopram [place]) 10 MG tablet Take one tab po qd for anxiety with food 07/17/20   Lavera Guise, MD  ferrous gluconate (FERGON) 324 MG tablet Take 1 tablet (324 mg total) by mouth daily with breakfast. Patient not taking: No sig reported 08/01/19   [name], NP  furosemide (LASIX) 40 MG tablet Take one to tabs once or twice days for fluid // swelling 07/17/20   Lavera Guise, MD  gabapentin (NEURONTIN) 100 MG capsule Take 1 capsule (100 mg total) by mouth in the morning, at noon, and at  bedtime. 07/17/20   Lavera Guise, MD  gemfibrozil (LOPID) 600 MG tablet Take 600 mg by mouth 2 (two) times daily. 07/17/20   [provider]  glimepiride (AMARYL) 2 MG tablet Take one tab po qd with supper 07/17/20   Lavera Guise, MD  glucose blood (TRUE METRIX BLOOD GLUCOSE TEST) test strip Use as instructed twice daily diag E11.65 07/30/19   [name], NP  guaiFENesin-codeine 100-10 MG/5ML syrup Take 5 mLs by mouth every 6 (six) hours as needed for cough. 09/18/20   Harvest Dark, MD  hydrALAZINE (APRESOLINE) 100 MG tablet Take 1 tablet (100 mg total) by mouth 3 (three) times daily. 07/17/20   Lavera Guise, MD  insulin glargine, 1 Unit Dial, (TOUJEO) 300 UNIT/ML Solostar Pen Inject 26 Units into the skin daily. 04/30/20   [name], NP  Insulin Pen Needle 32G X 6 MM MISC Use as directed 03/25/20   [name],  NP  ipratropium-albuterol (DUONEB) 0.5-2.5 (3) MG/3ML SOLN Take 3 mLs by nebulization every 6 (six) hours as needed. 03/08/19   Kendell Bane, NP  isosorbide mononitrate (IMDUR) 30 MG 24 hr tablet Take 1 tablet (30 mg total) by mouth daily. 07/17/20   Lavera Guise, MD  metoprolol succinate (TOPROL-XL) 50 MG 24 hr tablet Take 1 tablet (50 mg total) by mouth at bedtime. Take with or immediately following a meal. 07/17/20   Lavera Guise, MD  omeprazole (PRILOSEC) 40 MG capsule Take 1 capsule (40 mg total) by mouth in the morning and at bedtime. 07/17/20   Lavera Guise, MD  potassium chloride SA (KLOR-CON) 20 MEQ tablet Take 1 tablet (20 mEq total) by mouth 2 (two) times daily. 07/17/20   Lavera Guise, MD  rosuvastatin (CRESTOR) 10 MG tablet Take 1 tablet (10 mg total) by mouth daily. 07/17/20 10/15/20  Lavera Guise, MD  tiotropium (SPIRIVA HANDIHALER) 18 MCG inhalation capsule Place 1 capsule (18 mcg total) into inhaler and inhale daily. 06/02/20 08/31/20  [name], NP  TRUEplus Lancets 33G MISC Use as directed twice daily diag E11.65 07/30/19   [name], NP  vitamin B-12 (CYANOCOBALAMIN) 1000 MCG tablet Take 1 tablet (1,000 mcg total) by mouth daily. Patient not taking: No sig reported 11/21/19   Enzo Bi, MD  zinc sulfate 220 (50 Zn) MG capsule Take 1 capsule (220 mg total) by mouth daily. Patient not taking: No sig reported 02/04/20   Oswald Hillock, MD    Allergies Penicillins    Social History Social History   Tobacco Use  . Smoking status: Former Smoker    Types: Cigarettes  . Smokeless tobacco: Former Systems developer    Types: Secondary school teacher  . Vaping Use: Never used  Substance Use Topics  . Alcohol use: No  . Drug use: No    Review of Systems Patient denies headaches, rhinorrhea, blurry vision, numbness, shortness of breath, chest pain, edema, cough, abdominal pain, nausea, vomiting, diarrhea, dysuria, fevers, rashes or hallucinations unless otherwise stated above in  HPI. ____________________________________________   PHYSICAL EXAM:  VITAL SIGNS: Vitals:   10/06/20 1455 10/06/20 1530  BP:  (!) 184/104  Pulse: 66 74  Resp: 10 (!) 9  Temp:    SpO2: 98% 97%    Constitutional: Alert and oriented.  Eyes: Conjunctivae are normal.  Head: Atraumatic. Nose: No congestion/rhinnorhea. Mouth/Throat: Mucous membranes are moist.   Neck: No stridor. Painless ROM.  Cardiovascular: Normal rate, regular rhythm.  Grossly normal heart sounds.  Good peripheral circulation. Respiratory: Normal respiratory effort.  No retractions. Lungs CTAB. Gastrointestinal: Soft and nontender. No distention. No abdominal bruits. No CVA tenderness. Genitourinary:  Musculoskeletal: No lower extremity tenderness nor edema.  No joint effusions. Neurologic:  Normal speech and language. No gross focal neurologic deficits are appreciated. No facial droop Skin:  Skin is warm, dry and intact. No rash noted. Psychiatric: Mood and affect are normal. Speech and behavior are normal.  ____________________________________________   LABS (all labs ordered are listed, but only abnormal results are displayed)  Results for orders placed or performed during the hospital encounter of 10/06/20 (from the past 24 hour(s))  CBC     Status: Abnormal   Collection Time: 10/06/20  2:28 PM  Result Value Ref Range   WBC 6.5 4.0 - 10.5 K/uL   RBC 3.73 (L) 4.22 - 5.81 MIL/uL   Hemoglobin 10.7 (L) 13.0 - 17.0 g/dL   HCT 32.9 (L) 39.0 - 52.0 %   MCV 88.2 80.0 - 100.0 fL   MCH 28.7 26.0 - 34.0 pg   MCHC 32.5 30.0 - 36.0 g/dL   RDW 13.7 11.5 - 15.5 %   Platelets 173 150 - 400 K/uL   nRBC 0.0 0.0 - 0.2 %  Comprehensive metabolic panel     Status: Abnormal   Collection Time: 10/06/20  2:28 PM  Result Value Ref Range   Sodium 138 135 - 145 mmol/L   Potassium 4.0 3.5 - 5.1 mmol/L   Chloride 109 98 - 111 mmol/L   CO2 20 (L) 22 - 32 mmol/L   Glucose, Bld 115 (H) 70 - 99 mg/dL   BUN 38 (H) 8 - 23 mg/dL    Creatinine, Ser 2.10 (H) 0.61 - 1.24 mg/dL   Calcium 9.1 8.9 - 10.3 mg/dL   Total Protein 7.1 6.5 - 8.1 g/dL   Albumin 3.2 (L) 3.5 - 5.0 g/dL   AST 21 15 - 41 U/L   ALT 18 0 - 44 U/L   Alkaline Phosphatase 81 38 - 126 U/L   Total Bilirubin 0.4 0.3 - 1.2 mg/dL   GFR, Estimated 34 (L) >60 mL/min   Anion gap 9 5 - 15  Troponin I (High Sensitivity)     Status: Abnormal   Collection Time: 10/06/20  2:28 PM  Result Value Ref Range   Troponin I (High Sensitivity) 23 (H) <18 ng/L   ____________________________________________  EKG My review and personal interpretation at Time: 14:00   Indication: chest pain  Rate: 75  Rhythm: sinus Axis: left Other: lbbb, no sgarbossa criteria, abmnl ekg  My review and personal interpretation at Time: 14:11   Indication: chest pain  Rate: 80  Rhythm: sinus with frequent pvc Axis: normal Other: sinus beats with inferolateral twi that are concistent with previous  ____________________________________________  RADIOLOGY  I personally reviewed all radiographic images ordered to evaluate for the above acute complaints and reviewed radiology reports and findings.  These findings were personally discussed with the patient.  Please see medical record for radiology report.  ____________________________________________   PROCEDURES  Procedure(s) performed:  .Critical Care Performed by: Merlyn Lot, MD Authorized by: Merlyn Lot, MD   Critical care provider statement:    Critical care time (minutes):  10   Critical care time was exclusive of:  Separately billable procedures and treating other patients   Critical care was necessary to treat or prevent imminent or life-threatening deterioration of the following conditions:  Cardiac failure  Critical care was time spent personally by me on the following activities:  Development of treatment plan with patient or surrogate, discussions with consultants, evaluation of patient's response to  treatment, examination of patient, obtaining history from patient or surrogate, ordering and performing treatments and interventions, ordering and review of laboratory studies, ordering and review of radiographic studies, pulse oximetry, re-evaluation of patient's condition and review of old charts      Critical Care performed: yes ____________________________________________   INITIAL IMPRESSION / [place] / ED COURSE  Pertinent labs & imaging results that were available during my care of the patient were reviewed by me and considered in my medical decision making (see chart for details).   DDX: ACS, pericarditis, esophagitis, boerhaaves, pe, dissection, pna, bronchitis, costochondritis   Monti Jilek Wrightson is a 66 y.o. who presents to the ED with presentation as described above.  Patient extensive chronic comorbidities and past medical history presenting with chest pain and discomfort with EKG with frequent PVCs and aberrant conduction.  Underlying sinus beats appear consistent with previous but on review of EKG do not see significant PVC burden on previous.  Given his pain will order work-up for but differential.  His abdominal exam is otherwise soft and benign.  Clinical Course as of 10/06/20 1547  Tue Oct 06, 2020  1535 Patient with persistent discomfort.  Mildly elevated troponin.  Given history will heparinize and discussed with hospitalist for admission as well as consult cardiology. [PR]    Clinical Course User Index [PR] Merlyn Lot, MD    The patient was evaluated in Emergency Department today for the symptoms described in the history of present illness. He/she was evaluated in the context of the global COVID-19 pandemic, which necessitated consideration that the patient might be at risk for infection with the SARS-CoV-2 virus that causes COVID-19. Institutional protocols and algorithms that pertain to the evaluation of patients at risk for COVID-19 are in a  state of rapid change based on information released by regulatory bodies including the CDC and federal and state organizations. These policies and algorithms were followed during the patient's care in the ED.  As part of my medical decision making, I reviewed the following data within the [facility] notes reviewed and incorporated, Labs reviewed, notes from prior ED visits and [place] Controlled Substance Database   ____________________________________________   FINAL CLINICAL IMPRESSION(S) / ED DIAGNOSES  Final diagnoses:  Chest pain, unspecified type  Hypertension, unspecified type      NEW MEDICATIONS STARTED DURING THIS VISIT:  New Prescriptions   No medications on file     Note:  This document was prepared using Dragon voice recognition software and may include unintentional dictation errors.    Merlyn Lot, MD 10/06/20 [phone]

## 2020-10-06 NOTE — ED Triage Notes (Signed)
Presented via EMS for Chest pain starting this AM. Chest pain got worse while at dermatology appointment. Abnormal EKG with EMS.

## 2020-10-06 NOTE — Patient Instructions (Signed)
If you have any questions or concerns for your doctor, please call our main line at [phone] and press option 4 to reach your doctor's medical assistant. If no one answers, please leave a voicemail as directed and we will return your call as soon as possible. Messages left after 4 pm will be answered the following business day.   You may also send us a message via MyChart. We typically respond to MyChart messages within 1-2 business days.  For prescription refills, please ask your pharmacy to contact our office. Our fax number is [phone].  If you have an urgent issue when the clinic is closed that cannot wait until the next business day, you can page your doctor at the number below.    Please note that while we do our best to be available for urgent issues outside of office hours, we are not available 24/7.   If you have an urgent issue and are unable to reach us, you may choose to seek medical care at your doctor's office, retail clinic, urgent care center, or emergency room.  If you have a medical emergency, please immediately call 911 or go to the emergency department.  Pager Numbers  - Dr. Kowalski: [phone]  - Dr. Moye: [phone]  - Dr. Stewart: [phone]  In the event of inclement weather, please call our main line at [phone] for an update on the status of any delays or closures.  Dermatology Medication Tips: Please keep the boxes that topical medications come in in order to help keep track of the instructions about where and how to use these. Pharmacies typically print the medication instructions only on the boxes and not directly on the medication tubes.   If your medication is too expensive, please contact our office at [phone] option 4 or send us a message through MyChart.   We are unable to tell what your co-pay for medications will be in advance as this is different depending on your insurance coverage. However, we may be able to find a  substitute medication at lower cost or fill out paperwork to get insurance to cover a needed medication.   If a prior authorization is required to get your medication covered by your insurance company, please allow us 1-2 business days to complete this process.  Drug prices often vary depending on where the prescription is filled and some pharmacies may offer cheaper prices.  The website www.goodrx.com contains coupons for medications through different pharmacies. The prices here do not account for what the cost may be with help from insurance (it may be cheaper with your insurance), but the website can give you the price if you did not use any insurance.  - You can print the associated coupon and take it with your prescription to the pharmacy.  - You may also stop by our office during regular business hours and pick up a GoodRx coupon card.  - If you need your prescription sent electronically to a different pharmacy, notify our office through [facility] MyChart or by phone at [phone] option 4.     Wound Care Instructions  1. Cleanse wound gently with soap and water once a day then pat dry with clean gauze. Apply a thing coat of Petrolatum (petroleum jelly, "Vaseline") over the wound (unless you have an allergy to this). We recommend that you use a new, sterile tube of Vaseline. Do not pick or remove scabs. Do not remove the yellow or white "healing tissue" from the base of the wound.    2. Cover the wound with fresh, clean, nonstick gauze and secure with paper tape. You may use Band-Aids in place of gauze and tape if the would is small enough, but would recommend trimming much of the tape off as there is often too much. Sometimes Band-Aids can irritate the skin.  3. You should call the office for your biopsy report after 1 week if you have not already been contacted.  4. If you experience any problems, such as abnormal amounts of bleeding, swelling, significant bruising, significant pain,  or evidence of infection, please call the office immediately.  5. FOR ADULT SURGERY PATIENTS: If you need something for pain relief you may take 1 extra strength Tylenol (acetaminophen) AND 2 Ibuprofen (200mg each) together every 4 hours as needed for pain. (do not take these if you are allergic to them or if you have a reason you should not take them.) Typically, you may only need pain medication for 1 to 3 days.     

## 2020-10-06 NOTE — Consult Note (Signed)
[facility] for Heparin  Indication: chest pain/ACS  Allergies  Allergen Reactions  . Penicillins Rash    Patient Measurements: Height: 5\' 11"  (180.3 cm) Weight: 123.8 kg (272 lb 14.9 oz) IBW/kg (Calculated) : 75.3 Heparin Dosing Weight: 103 kg  Vital Signs: Temp: 97.7 F (36.5 C) (04/26 1355) Temp Source: Oral (04/26 1355) BP: 184/104 (04/26 1530) Pulse Rate: 74 (04/26 1530)  Labs: Recent Labs    10/06/20 1428  HGB 10.7*  HCT 32.9*  PLT 173  CREATININE 2.10*  TROPONINIHS 23*    Estimated Creatinine Clearance: 45.7 mL/min (A) (by C-G formula based on SCr of 2.1 mg/dL (H)).   Medical History: Past Medical History:  Diagnosis Date  . Allergy   . Coronary artery disease   . Diabetes mellitus without complication ([place])   . Diastolic CHF ([place])   . GERD (gastroesophageal reflux disease)   . Hyperlipidemia   . Hypertension   . Renal insufficiency   . Sleep apnea     Medications:  (Not in a hospital admission)  Scheduled:  . labetalol  5 mg Intravenous Once  . nitroGLYCERIN  1 inch Topical Once   Infusions:   PRN: nitroGLYCERIN Anti-infectives (From admission, onward)   None      Assessment: Pharmacy consulted to dose heparin for ACS. Trop 18 > 23. Baseline labs ordered.    Goal of Therapy:  Heparin level 0.3-0.7 units/ml Monitor platelets by anticoagulation protocol: Yes   Plan:  Give 4000 units bolus x 1 Start heparin infusion at 1350 units/hr Check anti-Xa level in 6 hours and daily while on heparin Continue to monitor H&H and platelets  Oswald Hillock, PharmD, BCPS 10/06/2020,3:43 PM

## 2020-10-06 NOTE — Progress Notes (Signed)
Follow-Up Visit   Subjective  Larry Callahan is a 65 y.o. male who presents for the following: Hidradenitis nodule (R axilla, pt presents for excision). Patient also has a irritating spot on the eyelid that is symptomatic and he would like removed.  The following portions of the chart were reviewed this encounter and updated as appropriate:   Tobacco  Allergies  Meds  Problems  Med Hx  Surg Hx  Fam Hx     Review of Systems:  No other skin or systemic complaints except as noted in HPI or Assessment and Plan.  Objective  Well appearing patient in no apparent distress; mood and affect are within normal limits.  A focused examination was performed including R axilla. Relevant physical exam findings are noted in the Assessment and Plan.  Objective  Right posterior axilla: 1.5cm nodule  Objective  Right inferior axilla: 0.8cm nodule  Objective  Left upper eyelid: Fleshy pedunculated pap   Assessment & Plan  Neoplasm of skin (2) - Inflamed cysts x2 in background of Hidradenitis suppurativa Right posterior axilla  Skin excision  Lesion length (cm):  1.5 Lesion width (cm):  1.5 Margin per side (cm):  0 Total excision diameter (cm):  1.5 Informed consent: discussed and consent obtained   Timeout: patient name, date of birth, surgical site, and procedure verified   Procedure prep:  Patient was prepped and draped in usual sterile fashion Prep type:  Isopropyl alcohol and povidone-iodine Anesthesia: the lesion was anesthetized in a standard fashion   Anesthesia comment:  6.0cc Anesthetic:  1% lidocaine w/ epinephrine 1-100,000 buffered w/ 8.4% NaHCO3 (0.5% bupivicaine buffered w/ 8.4% NaHC03) Instrument used: #15 blade   Hemostasis achieved with: pressure   Hemostasis achieved with comment:  Electrocautery Outcome: patient tolerated procedure well with no complications   Post-procedure details: sterile dressing applied and wound care instructions given    Dressing type: bandage and pressure dressing (Mupirocin)    Skin repair Complexity:  Complex Final length (cm):  2.5 Reason for type of repair: reduce tension to allow closure, reduce the risk of dehiscence, infection, and necrosis, reduce subcutaneous dead space and avoid a hematoma, allow closure of the large defect, preserve normal anatomy, preserve normal anatomical and functional relationships and enhance both functionality and cosmetic results   Undermining: area extensively undermined   Undermining comment:  Undermining Defect 1.5cm Subcutaneous layers (deep stitches):  Suture type: Vicryl (polyglactin 910)   Subcutaneous suture technique: Inverted Dermal. Fine/surface layer approximation (top stitches):  Suture size:  3-0 Suture type: nylon   Stitches: horizontal mattress   Suture removal (days):  7 Hemostasis achieved with: pressure Outcome: patient tolerated procedure well with no complications   Post-procedure details: sterile dressing applied and wound care instructions given   Dressing type: bandage, pressure dressing and bacitracin (Mupirocin)    Specimen 1 - Surgical pathology Differential Diagnosis: D48.5 Hidradenitis Nodule vs other  Check Margins: No 1.5cm nodule  Right inferior axilla  Skin excision  Lesion length (cm):  0.8 Lesion width (cm):  0.8 Margin per side (cm):  0 Total excision diameter (cm):  0.8 Informed consent: discussed and consent obtained   Timeout: patient name, date of birth, surgical site, and procedure verified   Procedure prep:  Patient was prepped and draped in usual sterile fashion Prep type:  Isopropyl alcohol and povidone-iodine Anesthesia: the lesion was anesthetized in a standard fashion   Anesthesia comment:  6cc Anesthetic:  1% lidocaine w/ epinephrine 1-100,000 buffered w/ 8.4% NaHCO3 (0.5%  bupivicaine buffered w/ 8.4% NaHC03) Instrument used comment:  19mm punch Hemostasis achieved with: pressure   Hemostasis achieved  with comment:  Electrocautery Outcome: patient tolerated procedure well with no complications   Post-procedure details: sterile dressing applied and wound care instructions given   Dressing type: bandage and pressure dressing (Mupirocin)    Skin repair Complexity:  Complex Final length (cm):  1 Reason for type of repair: reduce tension to allow closure, reduce the risk of dehiscence, infection, and necrosis, reduce subcutaneous dead space and avoid a hematoma, allow closure of the large defect, preserve normal anatomy, preserve normal anatomical and functional relationships and enhance both functionality and cosmetic results   Undermining: area extensively undermined   Undermining comment:  Undermining Defect Subcutaneous layers (deep stitches):  Suture size:  3-0 Suture type: Vicryl (polyglactin 910)   Subcutaneous suture technique: Inverted Dermal. Fine/surface layer approximation (top stitches):  Suture size:  3-0 Suture type: nylon   Stitches: horizontal mattress   Suture removal (days):  7 Hemostasis achieved with: pressure Outcome: patient tolerated procedure well with no complications   Post-procedure details: sterile dressing applied and wound care instructions given   Dressing type: bandage, pressure dressing and bacitracin (Mupirocin)    Specimen 2 - Surgical pathology Differential Diagnosis: D48.5 Hidradenitis vs other  Check Margins: No 0.8cm nodule  Start Mupirocin oint qd to excision site  Reordered Medications mupirocin ointment (BACTROBAN) 2 %  Skin tag Left upper eyelid Irritated Skin tag vs other, plan bx on f/u  Return in about 1 week (around 10/13/2020) for suture removal, bx of skin tag L upper eyelid.  After surgery patient was waiting for ride and had complaints of chest pain and shortness of breath.  After Dr. Nehemiah Massed spoke to patient, patient admitted he had the chest pains and shortness of breath before the surgery but he did not advise the doctor in  fear of not having today's surgery.  EMT was called and they assessed patient and took patient to hospital for further evaluation/care.  I, Larry Callahan, RMA, am acting as scribe for [name], MD .  Documentation: I have reviewed the above documentation for accuracy and completeness, and I agree with the above.  [name], MD

## 2020-10-06 NOTE — H&P (Addendum)
History and Physical    Daniell Mancinas Sowles GXQ:[id number] DOB: Aug 24, 1955 DOA: 10/06/2020  Referring MD/NP/PA:   PCP: Lavera Guise, MD   Patient coming from:  The patient is coming from home.  At baseline, pt is independent for most of ADL.        Chief Complaint: Chest pain  HPI: Larry Callahan is a 65 y.o. male with medical history significant of CAD,anemia, HTN, HLD, DM, COPD, stroke, OSA, CKD-IIIb, sCHF with EF 35-40%, who presents with chest pain.  Patient was recently hospitalized from 4/15-4/16 due to TIA.  Patient had a negative MRI for stroke. Patient states that his chest pain started this morning, which is located in the substernal area, burning-like pain, 3 out of 10 severity, persistent, nonradiating.  He states that he has chronic shortness of breath and dry cough due to COPD, which has not changed.  No fever or chills.  Patient does not have nausea vomiting, diarrhea or abdominal pain.  No symptoms of UTI.  Patient denies any fall or head injury recently, no rectal bleeding or dark stool.  ED Course: pt was found to have troponin 23, BNP 81, negative COVID PCR, stable renal function, temperature normal, blood pressure 184/104, heart rate 74, RR 22, oxygen saturation 97% on room air.  Chest x-ray showed interstitial thickening chronic bronchitic change.  Patient is admitted to progressive bed as inpatient.  Dr. Saunders Revel of cardiology is consulted.  Review of Systems:   General: no fevers, chills, no body weight gain, has fatigue HEENT: no blurry vision, hearing changes or sore throat Respiratory: has dyspnea, coughing, no wheezing CV: has chest pain, no palpitations GI: no nausea, vomiting, abdominal pain, diarrhea, constipation GU: no dysuria, burning on urination, increased urinary frequency, hematuria  Ext: no leg edema Neuro: no unilateral weakness, numbness, or tingling, no vision change or hearing loss Skin: no rash, no skin tear. MSK: No muscle spasm, no  deformity, no limitation of range of movement in spin Heme: No easy bruising.  Travel history: No recent long distant travel.  Allergy:  Allergies  Allergen Reactions  . Penicillins Rash    Past Medical History:  Diagnosis Date  . Allergy   . Coronary artery disease   . Diabetes mellitus without complication ([place])   . Diastolic CHF ([place])   . GERD (gastroesophageal reflux disease)   . Hyperlipidemia   . Hypertension   . Renal insufficiency   . Sleep apnea     Past Surgical History:  Procedure Laterality Date  . BACK SURGERY    . CARDIAC CATHETERIZATION    . CHOLECYSTECTOMY    . COLONOSCOPY WITH PROPOFOL N/A 04/12/2019   Procedure: COLONOSCOPY WITH PROPOFOL;  Surgeon: [name], MD;  Location: [facility] ENDOSCOPY;  Service: Gastroenterology;  Laterality: N/A;  . ESOPHAGOGASTRODUODENOSCOPY N/A 04/12/2019   Procedure: ESOPHAGOGASTRODUODENOSCOPY (EGD);  Surgeon: [name], MD;  Location: [facility] ENDOSCOPY;  Service: Gastroenterology;  Laterality: N/A;  . ESOPHAGOGASTRODUODENOSCOPY (EGD) WITH PROPOFOL N/A 11/21/2019   Procedure: ESOPHAGOGASTRODUODENOSCOPY (EGD) WITH PROPOFOL;  Surgeon: [name], MD;  Location: [facility] ENDOSCOPY;  Service: Gastroenterology;  Laterality: N/A;  . FLEXIBLE BRONCHOSCOPY Bilateral 05/17/2019   Procedure: FLEXIBLE BRONCHOSCOPY;  Surgeon: [name], MD;  Location: [name] ORS;  Service: Pulmonary;  Laterality: Bilateral;  . left arm surgery    . nephrectomy Left   . PARATHYROIDECTOMY    . RIGHT HEART CATH N/A 03/29/2019   Procedure: RIGHT HEART CATH;  Surgeon: [name], MD;  Location: [place] CV LAB;  Service: Cardiovascular;  Laterality: N/A;    Social History:  reports that he has quit smoking. His smoking use included cigarettes. He has quit using smokeless tobacco.  His smokeless tobacco use included chew. He reports that he does not drink alcohol and does not use drugs.  Family History:  Family History  Problem  Relation Age of Onset  . Diabetes Mother        2003  . Lung cancer Father   . Diabetes Sister   . Hypertension Sister   . Heart disease Sister   . Cancer Sister   . Bone cancer Brother      Prior to Admission medications   Medication Sig Start Date End Date Taking? Authorizing Provider  mupirocin ointment (BACTROBAN) 2 % Apply 1 application topically daily. Qd to excision site 10/06/20   Ralene Bathe, MD  acetaminophen (TYLENOL) 325 MG tablet Take 2 tablets (650 mg total) by mouth every 4 (four) hours as needed for headache or mild pain. Patient not taking: No sig reported 03/05/19   Nicholes Mango, MD  albuterol (VENTOLIN HFA) 108 (90 Base) MCG/ACT inhaler Inhale 2 puffs into the lungs every 6 (six) hours as needed for wheezing or shortness of breath. 08/31/20   Lavera Guise, MD  ascorbic acid (VITAMIN C) 500 MG tablet Take 1 tablet (500 mg total) by mouth daily. Patient not taking: No sig reported 02/04/20   Oswald Hillock, MD  aspirin EC 81 MG tablet Take 1 tablet (81 mg total) by mouth daily. Swallow whole. 09/26/20 09/26/21  Nita Sells, MD  Blood Glucose Calibration (TRUE METRIX LEVEL 1) Low SOLN Use as directed 04/04/18   [name], NP  Blood Glucose Monitoring Suppl (TRUE METRIX AIR GLUCOSE METER) DEVI 1 Device by Does not apply route 2 (two) times daily. 04/13/18   [name], NP  budesonide-formoterol (SYMBICORT) 160-4.5 MCG/ACT inhaler Inhale 2 puffs into the lungs 2 (two) times daily. 07/20/20   Lavera Guise, MD  escitalopram Loma Sousa) 10 MG tablet Take one tab po qd for anxiety with food 07/17/20   Lavera Guise, MD  ferrous gluconate (FERGON) 324 MG tablet Take 1 tablet (324 mg total) by mouth daily with breakfast. Patient not taking: No sig reported 08/01/19   [name], NP  furosemide (LASIX) 40 MG tablet Take one to tabs once or twice days for fluid // swelling 07/17/20   Lavera Guise, MD  gabapentin (NEURONTIN) 100 MG capsule Take 1 capsule (100 mg  total) by mouth in the morning, at noon, and at bedtime. 07/17/20   Lavera Guise, MD  gemfibrozil (LOPID) 600 MG tablet Take 600 mg by mouth 2 (two) times daily. 07/17/20   [provider]  glimepiride (AMARYL) 2 MG tablet Take one tab po qd with supper 07/17/20   Lavera Guise, MD  glucose blood (TRUE METRIX BLOOD GLUCOSE TEST) test strip Use as instructed twice daily diag E11.65 07/30/19   [name], NP  guaiFENesin-codeine 100-10 MG/5ML syrup Take 5 mLs by mouth every 6 (six) hours as needed for cough. 09/18/20   Harvest Dark, MD  hydrALAZINE (APRESOLINE) 100 MG tablet Take 1 tablet (100 mg total) by mouth 3 (three) times daily. 07/17/20   Lavera Guise, MD  insulin glargine, 1 Unit Dial, (TOUJEO) 300 UNIT/ML Solostar Pen Inject 26 Units into the skin daily. 04/30/20   [name], NP  Insulin Pen Needle  32G X 6 MM MISC Use as directed 03/25/20   [name], NP  ipratropium-albuterol (DUONEB) 0.5-2.5 (3) MG/3ML SOLN Take 3 mLs by nebulization every 6 (six) hours as needed. 03/08/19   Kendell Bane, NP  isosorbide mononitrate (IMDUR) 30 MG 24 hr tablet Take 1 tablet (30 mg total) by mouth daily. 07/17/20   Lavera Guise, MD  metoprolol succinate (TOPROL-XL) 50 MG 24 hr tablet Take 1 tablet (50 mg total) by mouth at bedtime. Take with or immediately following a meal. 07/17/20   Lavera Guise, MD  omeprazole (PRILOSEC) 40 MG capsule Take 1 capsule (40 mg total) by mouth in the morning and at bedtime. 07/17/20   Lavera Guise, MD  potassium chloride SA (KLOR-CON) 20 MEQ tablet Take 1 tablet (20 mEq total) by mouth 2 (two) times daily. 07/17/20   Lavera Guise, MD  rosuvastatin (CRESTOR) 10 MG tablet Take 1 tablet (10 mg total) by mouth daily. 07/17/20 10/15/20  Lavera Guise, MD  tiotropium (SPIRIVA HANDIHALER) 18 MCG inhalation capsule Place 1 capsule (18 mcg total) into inhaler and inhale daily. 06/02/20 08/31/20  [name], NP  TRUEplus Lancets 33G MISC Use as directed twice  daily diag E11.65 07/30/19   [name], NP  vitamin B-12 (CYANOCOBALAMIN) 1000 MCG tablet Take 1 tablet (1,000 mcg total) by mouth daily. Patient not taking: No sig reported 11/21/19   Enzo Bi, MD  zinc sulfate 220 (50 Zn) MG capsule Take 1 capsule (220 mg total) by mouth daily. Patient not taking: No sig reported 02/04/20   Oswald Hillock, MD    Physical Exam: Vitals:   10/06/20 1408 10/06/20 1455 10/06/20 1530 10/06/20 1700  BP: (!) 177/96  (!) 184/104 (!) 165/73  Pulse:  66 74 60  Resp:  10 (!) 9 15  Temp:      TempSrc:      SpO2:  98% 97% 96%  Weight: 123.8 kg     Height: _0  (1.803 m)      General: Not in acute distress HEENT:       Eyes: PERRL, EOMI, no scleral icterus.       ENT: No discharge from the ears and nose, no pharynx injection, no tonsillar enlargement.        Neck: No JVD, no bruit, no mass felt. Heme: No neck lymph node enlargement. Cardiac: S1/S2, RRR, No murmurs, No gallops or rubs. Respiratory: No rales, wheezing, rhonchi or rubs. GI: Soft, nondistended, nontender, no rebound pain, no organomegaly, BS present. GU: No hematuria Ext: No pitting leg edema bilaterally. 1+DP/PT pulse bilaterally. Musculoskeletal: No joint deformities, No joint redness or warmth, no limitation of ROM in spin. Skin: No rashes.  Neuro: Alert, oriented X3, cranial nerves II-XII grossly intact, moves all extremities normally. Psych: Patient is not psychotic, no suicidal or hemocidal ideation.  Labs on Admission: I have personally reviewed following labs and imaging studies  CBC: Recent Labs  Lab 10/06/20 1428  WBC 6.5  HGB 10.7*  HCT 32.9*  MCV 88.2  PLT 656   Basic Metabolic Panel: Recent Labs  Lab 10/06/20 1428  NA 138  K 4.0  CL 109  CO2 20*  GLUCOSE 115*  BUN 38*  CREATININE 2.10*  CALCIUM 9.1   GFR: Estimated Creatinine Clearance: 45.7 mL/min (A) (by C-G formula based on SCr of 2.1 mg/dL (H)). Liver Function Tests: Recent Labs  Lab  10/06/20 1428  AST 21  ALT 18  ALKPHOS  81  BILITOT 0.4  PROT 7.1  ALBUMIN 3.2*   No results for input(s): LIPASE, AMYLASE in the last 168 hours. No results for input(s): AMMONIA in the last 168 hours. Coagulation Profile: No results for input(s): INR, PROTIME in the last 168 hours. Cardiac Enzymes: No results for input(s): CKTOTAL, CKMB, CKMBINDEX, TROPONINI in the last 168 hours. BNP (last 3 results) No results for input(s): PROBNP in the last 8760 hours. HbA1C: No results for input(s): HGBA1C in the last 72 hours. CBG: No results for input(s): GLUCAP in the last 168 hours. Lipid Profile: No results for input(s): CHOL, HDL, LDLCALC, TRIG, CHOLHDL, LDLDIRECT in the last 72 hours. Thyroid Function Tests: No results for input(s): TSH, T4TOTAL, FREET4, T3FREE, THYROIDAB in the last 72 hours. Anemia Panel: No results for input(s): VITAMINB12, FOLATE, FERRITIN, TIBC, IRON, RETICCTPCT in the last 72 hours. Urine analysis:    Component Value Date/Time   COLORURINE YELLOW (A) 09/25/2020 1643   APPEARANCEUR HAZY (A) 09/25/2020 1643   APPEARANCEUR Cloudy (A) 11/23/2018 1103   LABSPEC 1.012 09/25/2020 1643   LABSPEC 1.005 03/06/2013 0929   PHURINE 5.0 09/25/2020 1643   GLUCOSEU 150 (A) 09/25/2020 1643   GLUCOSEU Negative 03/06/2013 0929   HGBUR NEGATIVE 09/25/2020 1643   BILIRUBINUR NEGATIVE 09/25/2020 1643   BILIRUBINUR negative 02/20/2019 1404   BILIRUBINUR Negative 11/23/2018 1103   BILIRUBINUR Negative 03/06/2013 0929   KETONESUR NEGATIVE 09/25/2020 1643   PROTEINUR >=300 (A) 09/25/2020 1643   UROBILINOGEN 0.2 02/20/2019 1404   NITRITE NEGATIVE 09/25/2020 1643   LEUKOCYTESUR NEGATIVE 09/25/2020 1643   LEUKOCYTESUR Negative 03/06/2013 0929   Sepsis Labs: _0 (procalcitonin:4,lacticidven:4) ) Recent Results (from the past 240 hour(s))  Resp Panel by RT-PCR (Flu A&B, Covid) Nasopharyngeal Swab     Status: None   Collection Time: 10/06/20  3:37 PM   Specimen:  Nasopharyngeal Swab; Nasopharyngeal(NP) swabs in vial transport medium  Result Value Ref Range Status   SARS Coronavirus 2 by RT PCR NEGATIVE NEGATIVE Final    Comment: (NOTE) SARS-CoV-2 target nucleic acids are NOT DETECTED.  The SARS-CoV-2 RNA is generally detectable in upper respiratory specimens during the acute phase of infection. The lowest concentration of SARS-CoV-2 viral copies this assay can detect is 138 copies/mL. A negative result does not preclude SARS-Cov-2 infection and should not be used as the sole basis for treatment or other patient management decisions. A negative result may occur with  improper specimen collection/handling, submission of specimen other than nasopharyngeal swab, presence of viral mutation(s) within the areas targeted by this assay, and inadequate number of viral copies(<138 copies/mL). A negative result must be combined with clinical observations, patient history, and epidemiological information. The expected result is Negative.  Fact Sheet for Patients:  EntrepreneurPulse.com.au  Fact Sheet for Healthcare Providers:  IncredibleEmployment.be  This test is no t yet approved or cleared by the [place] FDA and  has been authorized for detection and/or diagnosis of SARS-CoV-2 by FDA under an Emergency Use Authorization (EUA). This EUA will remain  in effect (meaning this test can be used) for the duration of the COVID-19 declaration under Section 564(b)(1) of the Act, 21 U.S.C.section 360bbb-3(b)(1), unless the authorization is terminated  or revoked sooner.       Influenza A by PCR NEGATIVE NEGATIVE Final   Influenza B by PCR NEGATIVE NEGATIVE Final    Comment: (NOTE) The Xpert Xpress SARS-CoV-2/FLU/RSV plus assay is intended as an aid in the diagnosis of influenza from Nasopharyngeal swab specimens and should not be  used as a sole basis for treatment. Nasal washings and aspirates are unacceptable for  Xpert Xpress SARS-CoV-2/FLU/RSV testing.  Fact Sheet for Patients: EntrepreneurPulse.com.au  Fact Sheet for Healthcare Providers: IncredibleEmployment.be  This test is not yet approved or cleared by the [place] FDA and has been authorized for detection and/or diagnosis of SARS-CoV-2 by FDA under an Emergency Use Authorization (EUA). This EUA will remain in effect (meaning this test can be used) for the duration of the COVID-19 declaration under Section 564(b)(1) of the Act, 21 U.S.C. section 360bbb-3(b)(1), unless the authorization is terminated or revoked.  Performed at [facility], [address] [place], [place] [address]      Radiological Exams on Admission: DG Chest Portable 1 View  Result Date: 10/06/2020 CLINICAL DATA:  Chest pain EXAM: PORTABLE CHEST 1 VIEW COMPARISON:  September 18, 2020 FINDINGS: Interstitium is thickened without edema or consolidation evident. Heart is borderline enlarged with pulmonary vascularity normal. No adenopathy. No bone lesions. IMPRESSION: Relative interstitial thickening which may indicate a degree of underlying bronchitis. No edema or consolidation. Heart borderline enlarged. No adenopathy appreciable. Electronically Signed   By: Lowella Grip III M.D.   On: 10/06/2020 14:58     EKG: I have personally reviewed.  Very abnormal EKG.  First EKG showed sinus rhythm, QTC 503, LAD, widening QRS, high T waves.  Second EKG showed frequent PVC, T wave inversion in V4-V6.  Assessment/Plan Principal Problem:   Chest pain Active Problems:   COPD (chronic obstructive pulmonary disease) (HCC)   Essential hypertension   Iron deficiency anemia   Chronic systolic CHF (congestive heart failure) (HCC)   Chronic kidney disease, stage 3b (HCC)   Type 2 diabetes mellitus with stage 3b chronic kidney disease, with long-term current use of insulin (HCC)   Elevated troponin   Gastroesophageal reflux disease  without esophagitis   CVA (cerebral vascular accident) ([place])   HLD (hyperlipidemia)   Chest pain, elevated trop and hx of CAD: trop 23.  Patient has persistent chest pain since this morning, suspecting non-STEMI.  EKG is very abnormal with high T wave, T wave inversion, frequent PVC and widening QRS. Dr. Saunders Revel of card is consulted.  - admit to progressive unit as inpatient - IV heparin  - Trend Trop - Repeat EKG in the am  - prn Nitroglycerin, Morphine, and aspirin, Crestor, Imdur - Metoprolol - Risk factor stratification: will check FLP and A1C  - check UDS - 2d echo  COPD (chronic obstructive pulmonary disease) ([place]): Stable -Bronchodilators  Essential hypertension -IV hydralazine as needed -Continue home hydralazine, metoprolol -Patient is also on Lasix for CHF  Iron deficiency anemia: Hemoglobin 10.7 (11.2 on 09/25/2020) -Continue iron supplement  Chronic systolic CHF (congestive heart failure) ([place]): 2D echo on 09/26/2020 showed EF of 35-40%.  Patient does not have leg edema or JVD.  CHF seem to be compensated. -Continue home Lasix  Chronic kidney disease, stage 3b ([place]): Stable. -Follow-up of by BMP  Type 2 diabetes mellitus with stage 3b chronic kidney disease, with long-term current use of insulin: Recent A1c 7.7, poorly controlled.  Patient is taking Amaryl and glipizide, glargine at home -Sliding scale insulin -decrease glargine insulin from 26 to 13 unit daily.  Gastroesophageal reflux disease without esophagitis -Protonix  CVA (cerebral vascular accident) ([place]) -Aspirin, Crestor  HLD (hyperlipidemia) -Crestor, Lopid      DVT ppx: on IV Heparin      Code Status: Full code Family Communication:  Yes, patient's sister  at bed  side Disposition Plan:  Anticipate discharge back to previous environment Consults called:  Dr. Saunders Revel of card Admission status and Level of care: Progressive Cardiac:    as inpt     Status is: Inpatient  Remains inpatient  appropriate because:Inpatient level of care appropriate due to severity of illness   Dispo: The patient is from: Home              Anticipated d/c is to: Home              Patient currently is not medically stable to d/c.   Difficult to place patient No           Date of Service 10/06/2020    [facility]   If 7PM-7AM, please contact night-coverage [email or web address] 10/06/2020, 6:03 PM

## 2020-10-06 NOTE — Consult Note (Signed)
[place] for Heparin  Indication: chest pain/ACS  Patient Measurements: Heparin Dosing Weight: 103 kg  Labs: Recent Labs    10/06/20 1428 10/06/20 1813 10/06/20 1911 10/06/20 2144  HGB 10.7*  --   --   --   HCT 32.9*  --   --   --   PLT 173  --   --   --   APTT  --  140*  --   --   LABPROT  --  14.0  --   --   INR  --  1.1  --   --   HEPARINUNFRC  --   --   --  0.20*  CREATININE 2.10*  --   --   --   TROPONINIHS 23* 26* 26*  --     Estimated Creatinine Clearance: 46 mL/min (A) (by C-G formula based on SCr of 2.1 mg/dL (H)).   Medical History: Past Medical History:  Diagnosis Date  . Allergy   . Coronary artery disease   . Diabetes mellitus without complication ([place])   . Diastolic CHF ([place])   . GERD (gastroesophageal reflux disease)   . Hyperlipidemia   . Hypertension   . Renal insufficiency   . Sleep apnea    Infusions:  . heparin 1,350 Units/hr (10/06/20 1655)    Assessment: Pharmacy consulted to dose heparin for ACS. Trop 18 > 23.   Baseline CBC with Hgb 10.7, platelets 173. Baseline INR 1.1, aPTT 140 (drawn after initiation of heparin infusion).   4/26 at 2144 HL 0.2; 1350 units/hr  Goal of Therapy:  Heparin level 0.3-0.7 units/ml Monitor platelets by anticoagulation protocol: Yes   Plan:  --Heparin level is subtherapeutic. Will give heparin 1500 unit IV bolus x 1 and increase infusion rate to 1550 units/hr --Re-check HL 6 hours after rate change --Daily CBC per protocol while on IV heparin  Benita Gutter 10/06/2020,10:18 PM

## 2020-10-06 NOTE — Consult Note (Signed)
Cardiology Consultation:   Patient ID: Larry Callahan MRN: [id number]; DOB: 1955/10/29  Admit date: 10/06/2020 Date of Consult: 10/06/2020  PCP:  Lavera Guise, [place]  Cardiologist:  [name], MD   Patient Profile:   Larry Callahan is a 65 y.o. male with a hx of nonobstructive coronary artery disease, chronic heart failure with recent decline in LVEF to 35-40% by echo earlier this month, hypertension, prior tobacco use, chronic kidney disease status post left nephrectomy with baseline creatinine of approximately 2, obstructive sleep apnea, type 2 diabetes mellitus, and morbid obesity, who is being seen today for the evaluation of chest pain at the request of Dr. Blaine Hamper.  History of Present Illness:   Larry Callahan was in his usual state of health until this morning when he suddenly began to feel tightness across his chest while in the waiting room at his dermatologist's office.  He did not feel that it was related to the skin lesion that was removed under his right axilla and mentioned it to the receptionist at the office.  EMS was summoned and transported Larry Callahan to the ED, where he has continued to have 3/10 chest tightness.  It seems to be worsened by some activity.  In the ED, he was noted to have frequent irregular heartbeats including episodes of wide complex rhythm.  He was given aspirin and offered nitroglycerin, which he refused out of concern for headaches.  He was instead given labetalol as well as IV heparin.  Of note, Larry Callahan was hospitalized last week at [facility] after suddenly developing right-sided weakness and numbness/tingling involving his face.  MRI of the brain did not reveal any acute abnormalities.  He was diagnosed with a TIA and advised to begin taking aspirin on a regular basis.  He reports being compliant with his medications.  He has not had any significant edema or weight gain.  He also denies orthopnea.  He has  experienced similar chest tightness in the past with pneumonia though he does not think that he has an infection.  He endorses intermittent cough with yellow sputum that he thinks may be due to allergies.  He has not had any fevers or chills.   Past Medical History:  Diagnosis Date  . Allergy   . Coronary artery disease   . Diabetes mellitus without complication ([place])   . Diastolic CHF ([place])   . GERD (gastroesophageal reflux disease)   . Hyperlipidemia   . Hypertension   . Renal insufficiency   . Sleep apnea     Past Surgical History:  Procedure Laterality Date  . BACK SURGERY    . CARDIAC CATHETERIZATION    . CHOLECYSTECTOMY    . COLONOSCOPY WITH PROPOFOL N/A 04/12/2019   Procedure: COLONOSCOPY WITH PROPOFOL;  Surgeon: [name], MD;  Location: [facility] ENDOSCOPY;  Service: Gastroenterology;  Laterality: N/A;  . ESOPHAGOGASTRODUODENOSCOPY N/A 04/12/2019   Procedure: ESOPHAGOGASTRODUODENOSCOPY (EGD);  Surgeon: [name], MD;  Location: [facility] ENDOSCOPY;  Service: Gastroenterology;  Laterality: N/A;  . ESOPHAGOGASTRODUODENOSCOPY (EGD) WITH PROPOFOL N/A 11/21/2019   Procedure: ESOPHAGOGASTRODUODENOSCOPY (EGD) WITH PROPOFOL;  Surgeon: [name], MD;  Location: [facility] ENDOSCOPY;  Service: Gastroenterology;  Laterality: N/A;  . FLEXIBLE BRONCHOSCOPY Bilateral 05/17/2019   Procedure: FLEXIBLE BRONCHOSCOPY;  Surgeon: [name], MD;  Location: [name] ORS;  Service: Pulmonary;  Laterality: Bilateral;  . left arm surgery    . nephrectomy Left   . PARATHYROIDECTOMY    .  RIGHT HEART CATH N/A 03/29/2019   Procedure: RIGHT HEART CATH;  Surgeon: [name], MD;  Location: [place] CV LAB;  Service: Cardiovascular;  Laterality: N/A;     Home Medications:  Prior to Admission medications   Medication Sig Start Date Zoey Bidwell Date Taking? Authorizing Provider  albuterol (VENTOLIN HFA) 108 (90 Base) MCG/ACT inhaler Inhale 2 puffs into the lungs every 6 (six) hours as  needed for wheezing or shortness of breath. 08/31/20  Yes Lavera Guise, MD  budesonide-formoterol [facility]) 160-4.5 MCG/ACT inhaler Inhale 2 puffs into the lungs 2 (two) times daily. 07/20/20  Yes Lavera Guise, MD  ferrous gluconate (FERGON) 324 MG tablet Take 1 tablet (324 mg total) by mouth daily with breakfast. 08/01/19  Yes [name], NP  furosemide (LASIX) 40 MG tablet Take one to tabs once or twice days for fluid // swelling 07/17/20  Yes Lavera Guise, MD  gabapentin (NEURONTIN) 100 MG capsule Take 1 capsule (100 mg total) by mouth in the morning, at noon, and at bedtime. 07/17/20  Yes Lavera Guise, MD  gemfibrozil (LOPID) 600 MG tablet Take 600 mg by mouth 2 (two) times daily. 07/17/20  Yes [provider]  glimepiride (AMARYL) 2 MG tablet Take one tab po qd with supper 07/17/20  Yes Lavera Guise, MD  hydrALAZINE (APRESOLINE) 100 MG tablet Take 1 tablet (100 mg total) by mouth 3 (three) times daily. 07/17/20  Yes Lavera Guise, MD  insulin glargine, 1 Unit Dial, (TOUJEO) 300 UNIT/ML Solostar Pen Inject 26 Units into the skin daily. 04/30/20  Yes Boscia, Heather E, NP  ipratropium-albuterol (DUONEB) 0.5-2.5 (3) MG/3ML SOLN Take 3 mLs by nebulization every 6 (six) hours as needed. 03/08/19  Yes Scarboro, Audie Clear, NP  isosorbide mononitrate (IMDUR) 30 MG 24 hr tablet Take 1 tablet (30 mg total) by mouth daily. 07/17/20  Yes Lavera Guise, MD  metoprolol succinate (TOPROL-XL) 50 MG 24 hr tablet Take 1 tablet (50 mg total) by mouth at bedtime. Take with or immediately following a meal. 07/17/20  Yes Lavera Guise, MD  omeprazole (PRILOSEC) 40 MG capsule Take 1 capsule (40 mg total) by mouth in the morning and at bedtime. 07/17/20  Yes Lavera Guise, MD  potassium chloride SA (KLOR-CON) 20 MEQ tablet Take 1 tablet (20 mEq total) by mouth 2 (two) times daily. 07/17/20  Yes Lavera Guise, MD  rosuvastatin (CRESTOR) 10 MG tablet Take 1 tablet (10 mg total) by mouth daily. 07/17/20 10/15/20 Yes Lavera Guise,  MD  tiotropium (SPIRIVA HANDIHALER) 18 MCG inhalation capsule Place 1 capsule (18 mcg total) into inhaler and inhale daily. 06/02/20 08/31/20 Yes Boscia, Greer Ee, NP  acetaminophen (TYLENOL) 325 MG tablet Take 2 tablets (650 mg total) by mouth every 4 (four) hours as needed for headache or mild pain. Patient not taking: No sig reported 03/05/19   Nicholes Mango, MD  ascorbic acid (VITAMIN C) 500 MG tablet Take 1 tablet (500 mg total) by mouth daily. Patient not taking: No sig reported 02/04/20   Oswald Hillock, MD  aspirin EC 81 MG tablet Take 1 tablet (81 mg total) by mouth daily. Swallow whole. Patient not taking: No sig reported 09/26/20 09/26/21  Nita Sells, MD  Blood Glucose Calibration (TRUE METRIX LEVEL 1) Low SOLN Use as directed 04/04/18   [name], NP  Blood Glucose Monitoring Suppl (TRUE METRIX AIR GLUCOSE METER) DEVI 1 Device by Does not apply route 2 (  two) times daily. 04/13/18   [name], NP  escitalopram (LEXAPRO) 10 MG tablet Take one tab po qd for anxiety with food Patient not taking: No sig reported 07/17/20   Lavera Guise, MD  glucose blood (TRUE METRIX BLOOD GLUCOSE TEST) test strip Use as instructed twice daily diag E11.65 07/30/19   [name], NP  guaiFENesin-codeine 100-10 MG/5ML syrup Take 5 mLs by mouth every 6 (six) hours as needed for cough. Patient not taking: No sig reported 09/18/20   Harvest Dark, MD  Insulin Pen Needle 32G X 6 MM MISC Use as directed 03/25/20   [name], NP  mupirocin ointment (BACTROBAN) 2 % Apply 1 application topically daily. Qd to excision site Patient not taking: No sig reported 10/06/20   Ralene Bathe, MD  TRUEplus Lancets 33G MISC Use as directed twice daily diag E11.65 07/30/19   [name], NP  vitamin B-12 (CYANOCOBALAMIN) 1000 MCG tablet Take 1 tablet (1,000 mcg total) by mouth daily. Patient not taking: No sig reported 11/21/19   Enzo Bi, MD  zinc sulfate 220 (50 Zn) MG capsule Take 1  capsule (220 mg total) by mouth daily. Patient not taking: No sig reported 02/04/20   Oswald Hillock, MD    Inpatient Medications: Scheduled Meds: . insulin aspart  0-5 Units Subcutaneous QHS  . insulin aspart  0-9 Units Subcutaneous TID WC  . metoprolol tartrate  25 mg Oral BID  . nitroGLYCERIN  1 inch Topical Once   Continuous Infusions: . heparin 1,350 Units/hr (10/06/20 1655)   PRN Meds: acetaminophen, albuterol, dextromethorphan-guaiFENesin, hydrALAZINE, hydrOXYzine, morphine injection, nitroGLYCERIN  Allergies:    Allergies  Allergen Reactions  . Penicillins Rash    Social History:   Social History   Tobacco Use  . Smoking status: Former Smoker    Types: Cigarettes  . Smokeless tobacco: Former Systems developer    Types: Secondary school teacher  . Vaping Use: Never used  Substance Use Topics  . Alcohol use: No  . Drug use: No     Family History:   Family History  Problem Relation Age of Onset  . Diabetes Mother        2003  . Lung cancer Father   . Diabetes Sister   . Hypertension Sister   . Heart disease Sister   . Cancer Sister   . Bone cancer Brother      ROS:  Please see the history of present illness. All other ROS reviewed and negative.     Physical Exam/Data:   Vitals:   10/06/20 1455 10/06/20 1530 10/06/20 1700 10/06/20 1854  BP:  (!) 184/104 (!) 165/73 (!) 161/94  Pulse: 66 74 60 72  Resp: 10 (!) _0 Temp:    98.3 F (36.8 C)  TempSrc:    Oral  SpO2: 98% 97% 96% 98%  Weight:    125.1 kg  Height:    _1  (1.803 m)   No intake or output data in the 24 hours ending 10/06/20 1913 Last 3 Weights 10/06/2020 10/06/2020 09/25/2020  Weight (lbs) 275 lb 12.8 oz 272 lb 14.9 oz 272 lb 14.9 oz  Weight (kg) 125.102 kg 123.8 kg 123.8 kg  Some encounter information is confidential and restricted. Go to Review Flowsheets activity to see all data.     Body mass index is 38.47 kg/m.  General: Morbidly obese man, standing beside the bed. HEENT: normal Lymph: no  adenopathy Neck: no JVD Endocrine:  No thryomegaly Vascular: No carotid bruits; 2+ radial pulses bilaterally. Cardiac: Regular rate and rhythm without murmurs, rubs, or gallops. Lungs: Mildly diminished breath sounds throughout without wheezes or crackles. Abd: soft, nontender, no hepatomegaly  Ext: no edema Musculoskeletal:  No deformities, BUE and BLE strength normal and equal Skin: warm and dry  Neuro:  CNs 2-12 intact, no focal abnormalities noted Psych:  Normal affect   EKG: EKG performed today at 1411 was personally reviewed and shows intermittent sinus rhythm with borderline LVH, ectopic atrial rhythm, and frequent PVCs.  Prior tracing earlier today at 1400 is consistent with accelerated idioventricular rhythm. Telemetry:  Telemetry was personally reviewed and demonstrates: Sinus rhythm and ectopic atrial rhythm with occasional PVCs as well as runs of accelerated idioventricular rhythm.  Relevant CV Studies: TTE (09/26/2020): Mildly dilated left ventricle with normal wall thickness.  LVEF 35-40% with global hypokinesis.  Normal RV size and function.  Mild mitral regurgitation.  Pharmacologic MPI (03/02/2019): High risk study due to reduced LVEF.  No significant ischemia.  LVEF less than 30% though accuracy is question due to poor tracer uptake.  Laboratory Data:  High Sensitivity Troponin:   Recent Labs  Lab 09/18/20 1220 09/18/20 1433 10/06/20 1428 10/06/20 1813  TROPONINIHS 18* 18* 23* 26*     Chemistry Recent Labs  Lab 10/06/20 1428  NA 138  K 4.0  CL 109  CO2 20*  GLUCOSE 115*  BUN 38*  CREATININE 2.10*  CALCIUM 9.1  GFRNONAA 34*  ANIONGAP 9    Recent Labs  Lab 10/06/20 1428  PROT 7.1  ALBUMIN 3.2*  AST 21  ALT 18  ALKPHOS 81  BILITOT 0.4   Hematology Recent Labs  Lab 10/06/20 1428  WBC 6.5  RBC 3.73*  HGB 10.7*  HCT 32.9*  MCV 88.2  MCH 28.7  MCHC 32.5  RDW 13.7  PLT 173   BNP Recent Labs  Lab 10/06/20 1428  BNP 81.7    DDimer No  results for input(s): DDIMER in the last 168 hours.   Radiology/Studies:  DG Chest Portable 1 View  Result Date: 10/06/2020 CLINICAL DATA:  Chest pain EXAM: PORTABLE CHEST 1 VIEW COMPARISON:  September 18, 2020 FINDINGS: Interstitium is thickened without edema or consolidation evident. Heart is borderline enlarged with pulmonary vascularity normal. No adenopathy. No bone lesions. IMPRESSION: Relative interstitial thickening which may indicate a degree of underlying bronchitis. No edema or consolidation. Heart borderline enlarged. No adenopathy appreciable. Electronically Signed   By: Lowella Grip III M.D.   On: 10/06/2020 14:58     Assessment and Plan:   Unstable angina: Patient presents with acute onset of chest tightness earlier today which has persisted despite some medical interventions.  His EKG shows PVCs and AIVR as well as ectopic atrial rhythm.  High-sensitivity troponin I is minimally elevated but flat (23 -> 26), which is not consistent with NSTEMI.  Continue IV heparin.  Continue metoprolol.  Encourage patient to try sublingual and/or transdermal nitroglycerin for relief of chest pain.  Patient will likely need ischemia evaluation during this admission.  If he continues to have frequent ectopy with ongoing chest pain, we will likely need to pursue catheterization though this is complicated by his chronic kidney disease.  If we can get him chest pain-free, myocardial perfusion stress test to exclude high risk ischemia could be considered as an alternative, though recent echo showing interval decline in LVEF to 35-40% is concerning for ischemic substrate.  Please keep the patient n.p.o. after midnight in  case procedures are needed tomorrow.  Recent lipid panel was reasonable with LDL 72.  We will add low-dose rosuvastatin for target LDL less than 70.  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  Accelerated idioventricular  rhythm can sometimes be a manifestation of coronary reperfusion, though troponin trend is not consistent with acute MI and spontaneous reperfusion.  Maintain potassium and magnesium levels greater than 4.0 and 2.0, respectively.  Add metoprolol tartrate 25 mg twice daily.  Check TSH  Chronic HFrEF: LVEF noted to be 35-40% on echo earlier this month.  Mr. Meidinger appears euvolemic on exam.  No need to repeat echo at this time, given the last study was 10 days ago.  Add low-dose metoprolol tartrate; could transition to metoprolol succinate or carvedilol prior to discharge as heart rate and blood pressure allow.  If renal function is stable, judicious use of ACE inhibitor or ARB should be considered with close monitoring of renal function and electrolytes.  Maintain net even fluid balance.  Diabetes mellitus:  Per primary team.   Risk Assessment/Risk Scores:   TIMI Risk Score for Unstable Angina or Non-ST Elevation MI:   The patient's TIMI risk score is 4, which indicates a 20% risk of all cause mortality, new or recurrent myocardial infarction or need for urgent revascularization in the next 14 days.    For questions or updates, please contact [facility] Please consult [email or web address] for contact info under [facility].  Signed, Nelva Bush, MD  10/06/2020 7:13 PM

## 2020-10-07 ENCOUNTER — Inpatient Hospital Stay (HOSPITAL_COMMUNITY): Payer: Medicare PPO

## 2020-10-07 ENCOUNTER — Other Ambulatory Visit: Payer: Self-pay | Admitting: Radiology

## 2020-10-07 ENCOUNTER — Encounter: Payer: Self-pay | Admitting: Internal Medicine

## 2020-10-07 ENCOUNTER — Inpatient Hospital Stay: Payer: Medicare PPO

## 2020-10-07 DIAGNOSIS — E782 Mixed hyperlipidemia: Secondary | ICD-10-CM

## 2020-10-07 DIAGNOSIS — R079 Chest pain, unspecified: Secondary | ICD-10-CM

## 2020-10-07 DIAGNOSIS — I10 Essential (primary) hypertension: Secondary | ICD-10-CM

## 2020-10-07 LAB — HEPARIN LEVEL (UNFRACTIONATED)
Heparin Unfractionated: 0.27 IU/mL — ABNORMAL LOW (ref 0.30–0.70)
Heparin Unfractionated: 0.4 IU/mL (ref 0.30–0.70)
Heparin Unfractionated: 0.36 IU/mL (ref 0.30–0.70)

## 2020-10-07 LAB — LIPID PANEL
Cholesterol: 131 mg/dL (ref 0–200)
HDL: 40 mg/dL — ABNORMAL LOW (ref >40)
LDL Cholesterol: 63 mg/dL (ref 0–99)
Total CHOL/HDL Ratio: 3.3 RATIO
Triglycerides: 142 mg/dL (ref <150)
VLDL: 28 mg/dL (ref 0–40)

## 2020-10-07 LAB — CBC
HCT: 34.5 % — ABNORMAL LOW (ref 39.0–52.0)
Hemoglobin: 11.3 g/dL — ABNORMAL LOW (ref 13.0–17.0)
MCH: 28.8 pg (ref 26.0–34.0)
MCHC: 32.8 g/dL (ref 30.0–36.0)
MCV: 88 fL (ref 80.0–100.0)
Platelets: 170 10*3/uL (ref 150–400)
RBC: 3.92 MIL/uL — ABNORMAL LOW (ref 4.22–5.81)
RDW: 13.8 % (ref 11.5–15.5)
WBC: 5.1 10*3/uL (ref 4.0–10.5)
nRBC: 0 % (ref 0.0–0.2)

## 2020-10-07 LAB — BASIC METABOLIC PANEL
Anion gap: 9 (ref 5–15)
BUN: 36 mg/dL — ABNORMAL HIGH (ref 8–23)
CO2: 21 mmol/L — ABNORMAL LOW (ref 22–32)
Calcium: 9.2 mg/dL (ref 8.9–10.3)
Chloride: 108 mmol/L (ref 98–111)
Creatinine, Ser: 2.09 mg/dL — ABNORMAL HIGH (ref 0.61–1.24)
GFR, Estimated: 34 mL/min — ABNORMAL LOW (ref >60)
Glucose, Bld: 171 mg/dL — ABNORMAL HIGH (ref 70–99)
Potassium: 4.4 mmol/L (ref 3.5–5.1)
Sodium: 138 mmol/L (ref 135–145)

## 2020-10-07 LAB — GLUCOSE, CAPILLARY
Glucose-Capillary: 128 mg/dL — ABNORMAL HIGH (ref 70–99)
Glucose-Capillary: 116 mg/dL — ABNORMAL HIGH (ref 70–99)
Glucose-Capillary: 255 mg/dL — ABNORMAL HIGH (ref 70–99)
Glucose-Capillary: 226 mg/dL — ABNORMAL HIGH (ref 70–99)

## 2020-10-07 MED ORDER — HYDRALAZINE HCL 50 MG PO TABS: 100.0000 mg | ORAL_TABLET | Freq: Three times a day (TID) | ORAL | Status: DC

## 2020-10-07 MED ORDER — CARVEDILOL 3.125 MG PO TABS
3.1250 mg | ORAL_TABLET | Freq: Two times a day (BID) | ORAL | Status: DC
  Administered 2020-10-07: 3.125 mg via ORAL
  Filled 2020-10-07: qty 1

## 2020-10-07 MED ORDER — FUROSEMIDE 40 MG PO TABS
40.0000 mg | ORAL_TABLET | Freq: Every day | ORAL | Status: DC
  Administered 2020-10-07 – 2020-10-08 (×2): 40 mg via ORAL
  Filled 2020-10-07 (×2): qty 1

## 2020-10-07 MED ORDER — FERROUS GLUCONATE 324 (38 FE) MG PO TABS
324.0000 mg | ORAL_TABLET | Freq: Every day | ORAL | Status: DC
  Administered 2020-10-08: 324 mg via ORAL
  Filled 2020-10-07 (×2): qty 1

## 2020-10-07 MED ORDER — GABAPENTIN 100 MG PO CAPS
100.0000 mg | ORAL_CAPSULE | Freq: Three times a day (TID) | ORAL | Status: DC
  Administered 2020-10-07 – 2020-10-08 (×3): 100 mg via ORAL
  Filled 2020-10-07 (×3): qty 1

## 2020-10-07 MED ORDER — PANTOPRAZOLE SODIUM 40 MG PO TBEC
40.0000 mg | DELAYED_RELEASE_TABLET | Freq: Every day | ORAL | Status: DC
  Administered 2020-10-07 – 2020-10-08 (×2): 40 mg via ORAL
  Filled 2020-10-07 (×2): qty 1

## 2020-10-07 MED ORDER — ISOSORBIDE MONONITRATE ER 30 MG PO TB24: 30.0000 mg | ORAL_TABLET | Freq: Every day | ORAL | Status: DC

## 2020-10-07 MED ORDER — HYDRALAZINE HCL 50 MG PO TABS
100.0000 mg | ORAL_TABLET | Freq: Three times a day (TID) | ORAL | Status: DC
  Administered 2020-10-07 – 2020-10-08 (×3): 100 mg via ORAL
  Filled 2020-10-07 (×3): qty 2

## 2020-10-07 MED ORDER — ASPIRIN EC 81 MG PO TBEC: 81.0000 mg | DELAYED_RELEASE_TABLET | Freq: Every day | ORAL | Status: DC

## 2020-10-07 MED ORDER — ASPIRIN EC 81 MG PO TBEC
81.0000 mg | DELAYED_RELEASE_TABLET | Freq: Every day | ORAL | Status: DC
  Administered 2020-10-07 – 2020-10-08 (×2): 81 mg via ORAL
  Filled 2020-10-07 (×2): qty 1

## 2020-10-07 MED ORDER — IPRATROPIUM-ALBUTEROL 0.5-2.5 (3) MG/3ML IN SOLN
3.0000 mL | Freq: Four times a day (QID) | RESPIRATORY_TRACT | Status: DC
  Administered 2020-10-07 – 2020-10-08 (×5): 3 mL via RESPIRATORY_TRACT
  Filled 2020-10-07 (×5): qty 3

## 2020-10-07 MED ORDER — MOMETASONE FURO-FORMOTEROL FUM 200-5 MCG/ACT IN AERO
2.0000 | INHALATION_SPRAY | Freq: Two times a day (BID) | RESPIRATORY_TRACT | Status: DC
  Administered 2020-10-07 – 2020-10-08 (×2): 2 via RESPIRATORY_TRACT
  Filled 2020-10-07: qty 8.8

## 2020-10-07 MED ORDER — INSULIN GLARGINE 100 UNIT/ML ~~LOC~~ SOLN
26.0000 [IU] | Freq: Every day | SUBCUTANEOUS | Status: DC
  Administered 2020-10-07: 26 [IU] via SUBCUTANEOUS
  Filled 2020-10-07 (×2): qty 0.26

## 2020-10-07 MED ORDER — FUROSEMIDE 40 MG PO TABS: 40.0000 mg | ORAL_TABLET | Freq: Every day | ORAL | Status: DC

## 2020-10-07 MED ORDER — HEPARIN BOLUS VIA INFUSION
1500.0000 [IU] | Freq: Once | INTRAVENOUS | Status: AC
  Administered 2020-10-07: 1500 [IU] via INTRAVENOUS
  Filled 2020-10-07: qty 1500

## 2020-10-07 MED ORDER — ISOSORBIDE MONONITRATE ER 30 MG PO TB24
30.0000 mg | ORAL_TABLET | Freq: Every day | ORAL | Status: DC
  Administered 2020-10-07 – 2020-10-08 (×2): 30 mg via ORAL
  Filled 2020-10-07 (×2): qty 1

## 2020-10-07 MED ORDER — METOPROLOL SUCCINATE ER 50 MG PO TB24: 50.0000 mg | ORAL_TABLET | Freq: Every day | ORAL | Status: DC

## 2020-10-07 MED ORDER — TIOTROPIUM BROMIDE MONOHYDRATE 18 MCG IN CAPS
18.0000 ug | ORAL_CAPSULE | Freq: Every day | RESPIRATORY_TRACT | Status: DC
  Administered 2020-10-07 – 2020-10-08 (×2): 18 ug via RESPIRATORY_TRACT
  Filled 2020-10-07: qty 5

## 2020-10-07 MED ORDER — GEMFIBROZIL 600 MG PO TABS
600.0000 mg | ORAL_TABLET | Freq: Two times a day (BID) | ORAL | Status: DC
  Administered 2020-10-07 – 2020-10-08 (×2): 600 mg via ORAL
  Filled 2020-10-07 (×3): qty 1

## 2020-10-07 MED ORDER — ROSUVASTATIN CALCIUM 10 MG PO TABS
10.0000 mg | ORAL_TABLET | Freq: Every day | ORAL | Status: DC
  Administered 2020-10-08: 10 mg via ORAL
  Filled 2020-10-07: qty 1

## 2020-10-07 MED ORDER — TECHNETIUM TC 99M TETROFOSMIN IV KIT
10.0000 | PACK | Freq: Once | INTRAVENOUS | Status: AC | PRN
  Administered 2020-10-07: 10.8 via INTRAVENOUS

## 2020-10-07 NOTE — Progress Notes (Addendum)
BP 203/91 prior to stress test. Previous BP 191/80.  Called telemetry and requested PRN hydralazine be brought to stress test lab, as his BP is currently too high to proceed with MPI.   Will reassess ability to stress test after hydralazine.   If SBP unable to be controlled prior to cutoffs for dosing, we will need to reschedule his stress test.   ADDENDUM: Unable to stress patient today due to SBP 215 after receipt of hydralazine. Pt then entered an idioventricular rhythm with widening QRS / intermittent left bundle. Called rounding MD and informed of rhythm and SBP 200s s/p hydralazine. Will adjust home medications so pt receives them as soon as hits the floor. Informed pt that the stress images will need delayed until 10/08/20 and all morning blood pressure medications should be administered prior to his stress test tomorrow morning.  Resting images were able to be collected today 10/06/20.  Will ensure the EKG images that were able to be captured today are uploaded to chart.

## 2020-10-07 NOTE — Progress Notes (Signed)
Progress Note  Patient Name: Larry Callahan Date of Encounter: 10/07/2020  [facility] Cardiologist: [name], MD   Subjective   Denies chest pain or shortness of breath.  Walked to the bathroom, feels well.  Currently on IV heparin infusion.  Wondering when she can go home  Inpatient Medications    Scheduled Meds: . aspirin EC  81 mg Oral Daily  . insulin aspart  0-5 Units Subcutaneous QHS  . insulin aspart  0-9 Units Subcutaneous TID WC  . metoprolol tartrate  25 mg Oral BID  . nitroGLYCERIN  1 inch Topical Once  . rosuvastatin  5 mg Oral Daily   Continuous Infusions: . heparin 1,750 Units/hr (10/07/20 0716)   PRN Meds: acetaminophen, albuterol, dextromethorphan-guaiFENesin, hydrALAZINE, hydrOXYzine, morphine injection, nitroGLYCERIN   Vital Signs    Vitals:   10/06/20 2327 10/07/20 0500 10/07/20 0812 10/07/20 1130  BP:  (!) 162/83 (!) 170/81 (!) 178/77  Pulse: (!) 52 (!) 57 (!) 58 (!) 51  Resp: 17 18 18 18   Temp:  98 F (36.7 C) 97.7 F (36.5 C) 97.6 F (36.4 C)  TempSrc:  Oral Oral Oral  SpO2: 100% 100% 100% 94%  Weight:  125.1 kg    Height:        Intake/Output Summary (Last 24 hours) at 10/07/2020 1137 Last data filed at 10/07/2020 1133 Gross per 24 hour  Intake 60 ml  Output 1225 ml  Net -1165 ml   Last 3 Weights 10/07/2020 10/06/2020 10/06/2020  Weight (lbs) 275 lb 12.8 oz 275 lb 12.8 oz 272 lb 14.9 oz  Weight (kg) 125.102 kg 125.102 kg 123.8 kg  Some encounter information is confidential and restricted. Go to Review Flowsheets activity to see all data.      Telemetry    Sinus bradycardia, PVCs, brief runs of accelerated idioventricular rhythm noted- Personally Reviewed  ECG    No new tracing- Personally Reviewed  Physical Exam   GEN: No acute distress.   Neck: No JVD Cardiac: RRR, no murmurs, rubs, or gallops.  Respiratory:  Diminished breath sounds at bases bilaterally GI: Soft, nontender, non-distended  MS: No edema; No  deformity. Neuro:  Nonfocal  Psych: Normal affect   Labs    High Sensitivity Troponin:   Recent Labs  Lab 09/18/20 1433 10/06/20 1428 10/06/20 1813 10/06/20 1911 10/06/20 2144  TROPONINIHS 18* 23* 26* 26* 25*      Chemistry Recent Labs  Lab 10/06/20 1428 10/07/20 0543  NA 138 138  K 4.0 4.4  CL 109 108  CO2 20* 21*  GLUCOSE 115* 171*  BUN 38* 36*  CREATININE 2.10* 2.09*  CALCIUM 9.1 9.2  PROT 7.1  --   ALBUMIN 3.2*  --   AST 21  --   ALT 18  --   ALKPHOS 81  --   BILITOT 0.4  --   GFRNONAA 34* 34*  ANIONGAP 9 9     Hematology Recent Labs  Lab 10/06/20 1428 10/07/20 0543  WBC 6.5 5.1  RBC 3.73* 3.92*  HGB 10.7* 11.3*  HCT 32.9* 34.5*  MCV 88.2 88.0  MCH 28.7 28.8  MCHC 32.5 32.8  RDW 13.7 13.8  PLT 173 170    BNP Recent Labs  Lab 10/06/20 1428  BNP 81.7     DDimer No results for input(s): DDIMER in the last 168 hours.   Radiology    DG Chest Portable 1 View  Result Date: 10/06/2020 CLINICAL DATA:  Chest pain EXAM: PORTABLE CHEST  1 VIEW COMPARISON:  September 18, 2020 FINDINGS: Interstitium is thickened without edema or consolidation evident. Heart is borderline enlarged with pulmonary vascularity normal. No adenopathy. No bone lesions. IMPRESSION: Relative interstitial thickening which may indicate a degree of underlying bronchitis. No edema or consolidation. Heart borderline enlarged. No adenopathy appreciable. Electronically Signed   By: Lowella Grip III M.D.   On: 10/06/2020 14:58    Cardiac Studies   Echocardiogram 09/26/2020 Reviewed by myself, showing mildly reduced ejection fraction, EF 45 to 50%  Patient Profile     65 y.o. male with history of obesity, CKD, left nephrectomy, hypertension presenting with chest pain.  Assessment & Plan    1.  Chest pain -Symptoms currently resolved.  On Nitropaste. -Echocardiogram dated 09/26/2020 reviewed by myself showing EF 45 to 50%.  (Not 35% as previously reported). -However, patient has  PVCs, occasional accelerated idioventricular rhythm (AIVR)on telemetry. -Continue heparin drip to complete a 48-hour course. -Obtain Lexiscan Myoview to evaluate presence of ischemia.   -Trying to avoid left heart cath due to CKD, left nephrectomy (only functioning kidney is right).  2.  Hyperlipidemia -Fibrate, statin  3.  Hypertension -Hydralazine, Lopressor.  Total encounter time 35 minutes  Greater than 50% was spent in counseling and coordination of care with the patient     Signed, Kate Sable, MD  10/07/2020, 11:37 AM

## 2020-10-07 NOTE — Consult Note (Signed)
[place] for Heparin  Indication: chest pain/ACS  Patient Measurements: Heparin Dosing Weight: 103 kg  Labs: Recent Labs    10/06/20 1428 10/06/20 1813 10/06/20 1911 10/06/20 2144 10/07/20 0543  HGB 10.7*  --   --   --  11.3*  HCT 32.9*  --   --   --  34.5*  PLT 173  --   --   --  170  APTT  --  140*  --   --   --   LABPROT  --  14.0  --   --   --   INR  --  1.1  --   --   --   HEPARINUNFRC  --   --   --  0.20* 0.27*  CREATININE 2.10*  --   --   --  2.09*  TROPONINIHS 23* 26* 26* 25*  --     Estimated Creatinine Clearance: 46.2 mL/min (A) (by C-G formula based on SCr of 2.09 mg/dL (H)).   Medical History: Past Medical History:  Diagnosis Date  . Allergy   . Coronary artery disease   . Diabetes mellitus without complication ([place])   . Diastolic CHF ([place])   . GERD (gastroesophageal reflux disease)   . Hyperlipidemia   . Hypertension   . Renal insufficiency   . Sleep apnea    Infusions:  . heparin 1,550 Units/hr (10/06/20 2226)    Assessment: Pharmacy consulted to dose heparin for ACS. Trop 18 > 23.   Baseline CBC with Hgb 10.7, platelets 173. Baseline INR 1.1, aPTT 140 (drawn after initiation of heparin infusion).   4/26 at 2144 HL 0.2; 1350 units/hr  Goal of Therapy:  Heparin level 0.3-0.7 units/ml Monitor platelets by anticoagulation protocol: Yes   Plan:  4/27:  HL @ 0543 = 0.27 Will order Heparin 1500 units IV X 1 and increase drip rate to 1750 units/hr.  Will recheck HL 6 hrs after rate change.   Joseph Bias D 10/07/2020,7:06 AM

## 2020-10-07 NOTE — Consult Note (Signed)
ANTICOAGULATION CONSULT NOTE  Pharmacy Consult for Heparin  Indication: chest pain/ACS  Patient Measurements: Heparin Dosing Weight: 103 kg  Labs: Recent Labs    10/06/20 1428 10/06/20 1813 10/06/20 1911 10/06/20 2144 10/06/20 2144 10/07/20 0543 10/07/20 1440 10/07/20 2117  HGB 10.7*  --   --   --   --  11.3*  --   --   HCT 32.9*  --   --   --   --  34.5*  --   --   PLT 173  --   --   --   --  170  --   --   APTT  --  140*  --   --   --   --   --   --   LABPROT  --  14.0  --   --   --   --   --   --   INR  --  1.1  --   --   --   --   --   --   HEPARINUNFRC  --   --   --  0.20*   < > 0.27* 0.40 0.36  CREATININE 2.10*  --   --   --   --  2.09*  --   --   TROPONINIHS 23* 26* 26* 25*  --   --   --   --    < > = values in this interval not displayed.    Estimated Creatinine Clearance: 46.2 mL/min (A) (by C-G formula based on SCr of 2.09 mg/dL (H)).   Medical History: Past Medical History:  Diagnosis Date  . Allergy   . Coronary artery disease   . Diabetes mellitus without complication ([place])   . Diastolic CHF ([place])   . GERD (gastroesophageal reflux disease)   . Hyperlipidemia   . Hypertension   . Renal insufficiency   . Sleep apnea    Infusions:  . heparin 1,750 Units/hr (10/07/20 2145)    Assessment: Pharmacy consulted to dose heparin for ACS. Trop 18 > 23.   Baseline CBC with Hgb 10.7, platelets 173. Baseline INR 1.1, aPTT 140 (drawn after initiation of heparin infusion).   4/26: HL @ 2144 HL 0.2 @ 1350 units/hr 4/27:  HL @ 0543 = 0.27 @ 1550 units/hr 4/27:  HL @ 1440 = 0.40 @ 1750 units/hr  - THERAPEUTIC X 1 4/27: HL @ 2117 = 0.36 @ 1750 units/hr - THERAPEUTIC X 2  Goal of Therapy:  Heparin level 0.3-0.7 units/ml Monitor platelets by anticoagulation protocol: Yes   Plan:  Heparin level therapeutic x 2 Will continue drip rate @ 1750 units/hr.  Will recheck HL tomorrow AM CBC's daily while on heparin drip   Benita Gutter 10/07/2020 9:53 PM

## 2020-10-07 NOTE — Consult Note (Signed)
ANTICOAGULATION CONSULT NOTE  Pharmacy Consult for Heparin  Indication: chest pain/ACS  Patient Measurements: Heparin Dosing Weight: 103 kg  Labs: Recent Labs    10/06/20 1428 10/06/20 1813 10/06/20 1911 10/06/20 2144 10/07/20 0543 10/07/20 1440  HGB 10.7*  --   --   --  11.3*  --   HCT 32.9*  --   --   --  34.5*  --   PLT 173  --   --   --  170  --   APTT  --  140*  --   --   --   --   LABPROT  --  14.0  --   --   --   --   INR  --  1.1  --   --   --   --   HEPARINUNFRC  --   --   --  0.20* 0.27* 0.40  CREATININE 2.10*  --   --   --  2.09*  --   TROPONINIHS 23* 26* 26* 25*  --   --     Estimated Creatinine Clearance: 46.2 mL/min (A) (by C-G formula based on SCr of 2.09 mg/dL (H)).   Medical History: Past Medical History:  Diagnosis Date  . Allergy   . Coronary artery disease   . Diabetes mellitus without complication ([place])   . Diastolic CHF ([place])   . GERD (gastroesophageal reflux disease)   . Hyperlipidemia   . Hypertension   . Renal insufficiency   . Sleep apnea    Infusions:  . heparin 1,750 Units/hr (10/07/20 0716)    Assessment: Pharmacy consulted to dose heparin for ACS. Trop 18 > 23.   Baseline CBC with Hgb 10.7, platelets 173. Baseline INR 1.1, aPTT 140 (drawn after initiation of heparin infusion).   4/26 at 2144 HL 0.2; 1350 units/hr  4/27:  HL @ 0543 = 0.27 @ 1550 units/hr 4/27:  HL @ 1440 = 0.40 @ 1750 units/hr  - THERAPEUTIC X 1  Goal of Therapy:  Heparin level 0.3-0.7 units/ml Monitor platelets by anticoagulation protocol: Yes   Plan:  Heparin level therapeutic x 1 Will continue drip rate @ 1750 units/hr.  Will recheck HL in 6 hrs for confirmation per protocol CBC's daily while on heparin drip   Lu Duffel, PharmD, BCPS Clinical Pharmacist 10/07/2020 3:17 PM

## 2020-10-07 NOTE — Progress Notes (Addendum)
Progress Note    Larry Callahan  HQP:[id number] DOB: [birth year]  DOA: 10/06/2020 PCP: Lavera Guise, MD      Brief Narrative:    Medical records reviewed and are as summarized below:  Larry Callahan is a 65 y.o. male       Assessment/Plan:   Principal Problem:   Chest pain Active Problems:   COPD (chronic obstructive pulmonary disease) (HCC)   Essential hypertension   Iron deficiency anemia   Chronic systolic CHF (congestive heart failure) (HCC)   Chronic kidney disease, stage 3b ([place])   Type 2 diabetes mellitus with stage 3b chronic kidney disease, with long-term current use of insulin (HCC)   Elevated troponin   Gastroesophageal reflux disease without esophagitis   CVA (cerebral vascular accident) ([place])   HLD (hyperlipidemia)   Body mass index is 38.47 kg/m.  (Obesity)   Chest pain, mildly elevated troponins: Cardiologist recommended continuing IV heparin infusion.  Monitor heparin level per protocol.  Continue aspirin, Coreg, Crestor.  Stress test could not be completed today because blood pressure was too high.  Plan to complete stress test tomorrow.   Previous 2D echo on 09/26/2020 showed EF estimated at 35 to 40%  Hypertensive urgency: Continue antihypertensives.  Chronic systolic CHF: Continue antihypertensives  History of stroke: Continue aspirin and statin  CKD stage IIIb: Creatinine is stable  Type II DM: Continue Lantus and NovoLog.  COPD: Stable.  Continue bronchodilators   Diet Order            Diet heart healthy/carb modified Room service appropriate? Yes; Fluid consistency: Thin  Diet effective now                    Consultants:  Cardiologist  Procedures:  None    Medications:   . aspirin EC  81 mg Oral Daily  . carvedilol  3.125 mg Oral BID WC  . [START ON 10/08/2020] ferrous gluconate  324 mg Oral Q breakfast  . furosemide  40 mg Oral Daily  . gabapentin  100 mg Oral TID  . gemfibrozil  600 mg  Oral BID  . hydrALAZINE  100 mg Oral TID  . insulin aspart  0-5 Units Subcutaneous QHS  . insulin aspart  0-9 Units Subcutaneous TID WC  . insulin glargine  26 Units Subcutaneous QHS  . ipratropium-albuterol  3 mL Nebulization Q6H  . isosorbide mononitrate  30 mg Oral Daily  . mometasone-formoterol  2 puff Inhalation BID  . nitroGLYCERIN  1 inch Topical Once  . pantoprazole  40 mg Oral Daily  . [START ON 10/08/2020] rosuvastatin  10 mg Oral Daily  . tiotropium  18 mcg Inhalation Daily   Continuous Infusions: . heparin 1,750 Units/hr (10/07/20 0716)     Anti-infectives (From admission, onward)   None             Family Communication/Anticipated D/C date and plan/Code Status   DVT prophylaxis:      Code Status: Full Code  Family Communication: None Disposition Plan:    Status is: Inpatient  Remains inpatient appropriate because:Inpatient level of care appropriate due to severity of illness   Dispo: The patient is from: Home              Anticipated d/c is to: Home              Patient currently is not medically stable to d/c.   Difficult to place patient No  Subjective:   Interval events noted.  No chest pain or shortness of breath.  Objective:    Vitals:   10/07/20 1304 10/07/20 1443 10/07/20 1511 10/07/20 1643  BP: (!) 191/80 (!) 195/87  (!) 133/55  Pulse: (!) 52 61  74  Resp:  18    Temp:  97.9 F (36.6 C)    TempSrc:      SpO2:  96% 96%   Weight:      Height:       No data found.   Intake/Output Summary (Last 24 hours) at 10/07/2020 1736 Last data filed at 10/07/2020 1644 Gross per 24 hour  Intake 60 ml  Output 1350 ml  Net -1290 ml   Filed Weights   10/06/20 1408 10/06/20 1854 10/07/20 0500  Weight: 123.8 kg 125.1 kg 125.1 kg    Exam:  GEN: NAD SKIN: No rash EYES: EOMI ENT: MMM CV: RRR PULM: CTA B ABD: soft, ND, NT, +BS CNS: AAO x 3, non focal EXT: No edema or tenderness        Data Reviewed:   I  have personally reviewed following labs and imaging studies:  Labs: Labs show the following:   Basic Metabolic Panel: Recent Labs  Lab 10/06/20 1428 10/06/20 1911 10/07/20 0543  NA 138  --  138  K 4.0  --  4.4  CL 109  --  108  CO2 20*  --  21*  GLUCOSE 115*  --  171*  BUN 38*  --  36*  CREATININE 2.10*  --  2.09*  CALCIUM 9.1  --  9.2  MG  --  1.9  --    GFR Estimated Creatinine Clearance: 46.2 mL/min (A) (by C-G formula based on SCr of 2.09 mg/dL (H)). Liver Function Tests: Recent Labs  Lab 10/06/20 1428  AST 21  ALT 18  ALKPHOS 81  BILITOT 0.4  PROT 7.1  ALBUMIN 3.2*   No results for input(s): LIPASE, AMYLASE in the last 168 hours. No results for input(s): AMMONIA in the last 168 hours. Coagulation profile Recent Labs  Lab 10/06/20 1813  INR 1.1    CBC: Recent Labs  Lab 10/06/20 1428 10/07/20 0543  WBC 6.5 5.1  HGB 10.7* 11.3*  HCT 32.9* 34.5*  MCV 88.2 88.0  PLT 173 170   Cardiac Enzymes: No results for input(s): CKTOTAL, CKMB, CKMBINDEX, TROPONINI in the last 168 hours. BNP (last 3 results) No results for input(s): PROBNP in the last 8760 hours. CBG: Recent Labs  Lab 10/06/20 2040 10/07/20 0813 10/07/20 1127 10/07/20 1643  GLUCAP 241* 128* 116* 255*   D-Dimer: No results for input(s): DDIMER in the last 72 hours. Hgb A1c: Recent Labs    10/06/20 1813  HGBA1C 7.6*   Lipid Profile: Recent Labs    10/07/20 0543  CHOL 131  HDL 40*  LDLCALC 63  TRIG 142  CHOLHDL 3.3   Thyroid function studies: Recent Labs    10/06/20 1911  TSH 1.561   Anemia work up: No results for input(s): VITAMINB12, FOLATE, FERRITIN, TIBC, IRON, RETICCTPCT in the last 72 hours. Sepsis Labs: Recent Labs  Lab 10/06/20 1428 10/07/20 0543  WBC 6.5 5.1    Microbiology Recent Results (from the past 240 hour(s))  Resp Panel by RT-PCR (Flu A&B, Covid) Nasopharyngeal Swab     Status: None   Collection Time: 10/06/20  3:37 PM   Specimen: Nasopharyngeal  Swab; Nasopharyngeal(NP) swabs in vial transport medium  Result Value Ref Range Status  SARS Coronavirus 2 by RT PCR NEGATIVE NEGATIVE Final    Comment: (NOTE) SARS-CoV-2 target nucleic acids are NOT DETECTED.  The SARS-CoV-2 RNA is generally detectable in upper respiratory specimens during the acute phase of infection. The lowest concentration of SARS-CoV-2 viral copies this assay can detect is 138 copies/mL. A negative result does not preclude SARS-Cov-2 infection and should not be used as the sole basis for treatment or other patient management decisions. A negative result may occur with  improper specimen collection/handling, submission of specimen other than nasopharyngeal swab, presence of viral mutation(s) within the areas targeted by this assay, and inadequate number of viral copies(<138 copies/mL). A negative result must be combined with clinical observations, patient history, and epidemiological information. The expected result is Negative.  Fact Sheet for Patients:  EntrepreneurPulse.com.au  Fact Sheet for Healthcare Providers:  IncredibleEmployment.be  This test is no t yet approved or cleared by the [place] FDA and  has been authorized for detection and/or diagnosis of SARS-CoV-2 by FDA under an Emergency Use Authorization (EUA). This EUA will remain  in effect (meaning this test can be used) for the duration of the COVID-19 declaration under Section 564(b)(1) of the Act, 21 U.S.C.section 360bbb-3(b)(1), unless the authorization is terminated  or revoked sooner.       Influenza A by PCR NEGATIVE NEGATIVE Final   Influenza B by PCR NEGATIVE NEGATIVE Final    Comment: (NOTE) The Xpert Xpress SARS-CoV-2/FLU/RSV plus assay is intended as an aid in the diagnosis of influenza from Nasopharyngeal swab specimens and should not be used as a sole basis for treatment. Nasal washings and aspirates are unacceptable for Xpert Xpress  SARS-CoV-2/FLU/RSV testing.  Fact Sheet for Patients: EntrepreneurPulse.com.au  Fact Sheet for Healthcare Providers: IncredibleEmployment.be  This test is not yet approved or cleared by the [place] FDA and has been authorized for detection and/or diagnosis of SARS-CoV-2 by FDA under an Emergency Use Authorization (EUA). This EUA will remain in effect (meaning this test can be used) for the duration of the COVID-19 declaration under Section 564(b)(1) of the Act, 21 U.S.C. section 360bbb-3(b)(1), unless the authorization is terminated or revoked.  Performed at [facility], [address] [place], [place] [address]     Procedures and diagnostic studies:  DG Chest Portable 1 View  Result Date: 10/06/2020 CLINICAL DATA:  Chest pain EXAM: PORTABLE CHEST 1 VIEW COMPARISON:  September 18, 2020 FINDINGS: Interstitium is thickened without edema or consolidation evident. Heart is borderline enlarged with pulmonary vascularity normal. No adenopathy. No bone lesions. IMPRESSION: Relative interstitial thickening which may indicate a degree of underlying bronchitis. No edema or consolidation. Heart borderline enlarged. No adenopathy appreciable. Electronically Signed   By: Lowella Grip III M.D.   On: 10/06/2020 14:58               LOS: 1 day   Jorje Vanatta  [facility]   Pager on [email or web address]. If 7PM-7AM, please contact night-coverage at [email or web address]     10/07/2020, 5:36 PM

## 2020-10-07 NOTE — Progress Notes (Signed)
Patient refused CPAP for the night.  Patient opted for wearing nasal cannula at 2LPM.  Nurse notified.

## 2020-10-07 NOTE — Progress Notes (Signed)
Contacted by nuclear medicine. Pt unable to lay flat during MPI.  Reportedly has required [place] oxygen in the past during MPI. Requests [place] oxygen to proceed with MPI. 1x order placed for 2L [place] O2 during stress test only.  BP noted to be elevated when on the floor. Will get additional BP reading before stress test and plan to reassess volume status before proceeding with MPI today.

## 2020-10-08 ENCOUNTER — Encounter: Payer: Self-pay | Admitting: Dermatology

## 2020-10-08 ENCOUNTER — Inpatient Hospital Stay: Payer: Medicare PPO

## 2020-10-08 ENCOUNTER — Other Ambulatory Visit: Payer: Self-pay | Admitting: Adult Health

## 2020-10-08 DIAGNOSIS — I639 Cerebral infarction, unspecified: Secondary | ICD-10-CM

## 2020-10-08 DIAGNOSIS — E1122 Type 2 diabetes mellitus with diabetic chronic kidney disease: Secondary | ICD-10-CM

## 2020-10-08 DIAGNOSIS — R079 Chest pain, unspecified: Secondary | ICD-10-CM

## 2020-10-08 DIAGNOSIS — N1832 Chronic kidney disease, stage 3b: Secondary | ICD-10-CM

## 2020-10-08 DIAGNOSIS — Z794 Long term (current) use of insulin: Secondary | ICD-10-CM

## 2020-10-08 DIAGNOSIS — J449 Chronic obstructive pulmonary disease, unspecified: Secondary | ICD-10-CM

## 2020-10-08 LAB — CBC
HCT: 32.9 % — ABNORMAL LOW (ref 39.0–52.0)
Hemoglobin: 10.6 g/dL — ABNORMAL LOW (ref 13.0–17.0)
MCH: 28.6 pg (ref 26.0–34.0)
MCHC: 32.2 g/dL (ref 30.0–36.0)
MCV: 88.7 fL (ref 80.0–100.0)
Platelets: 157 10*3/uL (ref 150–400)
RBC: 3.71 MIL/uL — ABNORMAL LOW (ref 4.22–5.81)
RDW: 13.7 % (ref 11.5–15.5)
WBC: 5.1 10*3/uL (ref 4.0–10.5)
nRBC: 0 % (ref 0.0–0.2)

## 2020-10-08 LAB — NM MYOCAR MULTI W/SPECT W/WALL MOTION / EF
Estimated workload: 1 METS
Exercise duration (min): 0 min
Exercise duration (sec): 0 s
LV dias vol: 106 mL (ref 62–150)
LV sys vol: 53 mL
MPHR: 153 {beats}/min
Peak HR: 101 {beats}/min
Percent HR: 66 %
Rest HR: 66 {beats}/min
SDS: 1
SRS: 12
SSS: 4

## 2020-10-08 LAB — HEPARIN LEVEL (UNFRACTIONATED): Heparin Unfractionated: 0.32 IU/mL (ref 0.30–0.70)

## 2020-10-08 LAB — GLUCOSE, CAPILLARY
Glucose-Capillary: 185 mg/dL — ABNORMAL HIGH (ref 70–99)
Glucose-Capillary: 316 mg/dL — ABNORMAL HIGH (ref 70–99)

## 2020-10-08 MED ORDER — REGADENOSON 0.4 MG/5ML IV SOLN
0.4000 mg | Freq: Once | INTRAVENOUS | Status: AC
  Administered 2020-10-08: 0.4 mg via INTRAVENOUS

## 2020-10-08 MED ORDER — ISOSORBIDE MONONITRATE ER 30 MG PO TB24: 30.0000 mg | ORAL_TABLET | Freq: Two times a day (BID) | ORAL | Status: DC

## 2020-10-08 MED ORDER — CARVEDILOL 6.25 MG PO TABS
6.2500 mg | ORAL_TABLET | Freq: Two times a day (BID) | ORAL | Status: DC
  Administered 2020-10-08: 6.25 mg via ORAL
  Filled 2020-10-08: qty 1

## 2020-10-08 MED ORDER — CARVEDILOL 6.25 MG PO TABS
6.2500 mg | ORAL_TABLET | Freq: Two times a day (BID) | ORAL | 0 refills | Status: DC
Start: 2020-10-08 — End: 2021-01-07

## 2020-10-08 MED ORDER — ISOSORBIDE MONONITRATE ER 30 MG PO TB24
30.0000 mg | ORAL_TABLET | Freq: Two times a day (BID) | ORAL | 0 refills | Status: DC
Start: 2020-10-08 — End: 2021-06-29

## 2020-10-08 MED ORDER — TECHNETIUM TC 99M TETROFOSMIN IV KIT
32.1600 | PACK | Freq: Once | INTRAVENOUS | Status: AC | PRN
  Administered 2020-10-08: 32.16 via INTRAVENOUS

## 2020-10-08 NOTE — Discharge Summary (Signed)
Physician Discharge Summary  Larry Callahan YFV:[id number] DOB: 23-Mar-1956 DOA: 10/06/2020  PCP: Lavera Guise, MD  Admit date: 10/06/2020 Discharge date: 10/08/2020  Discharge disposition: Home   Recommendations for Outpatient Follow-Up:   Follow-up with PCP in 1 week Follow-up with cardiologist in 1 to 2 weeks.   Discharge Diagnosis:   Principal Problem:   Chest pain Active Problems:   COPD (chronic obstructive pulmonary disease) (HCC)   Essential hypertension   Iron deficiency anemia   Chronic systolic CHF (congestive heart failure) (HCC)   Chronic kidney disease, stage 3b (HCC)   Type 2 diabetes mellitus with stage 3b chronic kidney disease, with long-term current use of insulin (HCC)   Elevated troponin   Gastroesophageal reflux disease without esophagitis   CVA (cerebral vascular accident) ([place])   HLD (hyperlipidemia)    Discharge Condition: Stable.  Diet recommendation:  Diet Order            Diet Heart Room service appropriate? Yes; Fluid consistency: Thin  Diet effective now           Diet - low sodium heart healthy           Diet Carb Modified                   Code Status: Full Code     Hospital Course:   Larry Callahan is a 65 y.o. male with medical history significant of CAD,anemia, HTN, HLD, DM, COPD,recent hospitalization for TIA (09/25/2020 to 09/26/2020) stroke, OSA, CKD-IIIb, chronic systolic CHF with EF 16-38%, who presented to the hospital with chest pain.  Troponins were minimally elevated.  He was admitted to progressive cardiac unit.  He was treated with IV heparin infusion.  Cardiologist was consulted to assist with management.  Pharmacologic nuclear stress test was performed but there was no evidence of reversible ischemia.  He had hypertensive urgency and the importance of medical adherence was emphasized.  His condition has improved and he is deemed stable for discharge to home.     Discharge Exam:     Vitals:   10/08/20 0159 10/08/20 0407 10/08/20 0729 10/08/20 1146  BP:  (!) 165/75 (!) 171/83 140/76  Pulse:  61 63 74  Resp:  20 18 18   Temp:  98.3 F (36.8 C) 98.4 F (36.9 C) 98.3 F (36.8 C)  TempSrc:      SpO2: 97% 96% 98% 96%  Weight:      Height:         GEN: NAD SKIN: Warm and dry EYES: No pallor or icterus ENT: MMM CV: RRR PULM: CTA B ABD: soft, ND, NT, +BS CNS: AAO x 3, non focal EXT: No edema or tenderness   The results of significant diagnostics from this hospitalization (including imaging, microbiology, ancillary and laboratory) are listed below for reference.     Procedures and Diagnostic Studies:   DG Chest Portable 1 View  Result Date: 10/06/2020 CLINICAL DATA:  Chest pain EXAM: PORTABLE CHEST 1 VIEW COMPARISON:  September 18, 2020 FINDINGS: Interstitium is thickened without edema or consolidation evident. Heart is borderline enlarged with pulmonary vascularity normal. No adenopathy. No bone lesions. IMPRESSION: Relative interstitial thickening which may indicate a degree of underlying bronchitis. No edema or consolidation. Heart borderline enlarged. No adenopathy appreciable. Electronically Signed   By: Lowella Grip III M.D.   On: 10/06/2020 14:58     Labs:   Basic Metabolic Panel: Recent Labs  Lab 10/06/20 1428 10/06/20 1911 10/07/20  0543  NA 138  --  138  K 4.0  --  4.4  CL 109  --  108  CO2 20*  --  21*  GLUCOSE 115*  --  171*  BUN 38*  --  36*  CREATININE 2.10*  --  2.09*  CALCIUM 9.1  --  9.2  MG  --  1.9  --    GFR Estimated Creatinine Clearance: 46.2 mL/min (A) (by C-G formula based on SCr of 2.09 mg/dL (H)). Liver Function Tests: Recent Labs  Lab 10/06/20 1428  AST 21  ALT 18  ALKPHOS 81  BILITOT 0.4  PROT 7.1  ALBUMIN 3.2*   No results for input(s): LIPASE, AMYLASE in the last 168 hours. No results for input(s): AMMONIA in the last 168 hours. Coagulation profile Recent Labs  Lab 10/06/20 1813  INR 1.1     CBC: Recent Labs  Lab 10/06/20 1428 10/07/20 0543 10/08/20 0606  WBC 6.5 5.1 5.1  HGB 10.7* 11.3* 10.6*  HCT 32.9* 34.5* 32.9*  MCV 88.2 88.0 88.7  PLT 173 170 157   Cardiac Enzymes: No results for input(s): CKTOTAL, CKMB, CKMBINDEX, TROPONINI in the last 168 hours. BNP: Invalid input(s): POCBNP CBG: Recent Labs  Lab 10/07/20 1127 10/07/20 1643 10/07/20 1930 10/08/20 0728 10/08/20 1145  GLUCAP 116* 255* 226* 185* 316*   D-Dimer No results for input(s): DDIMER in the last 72 hours. Hgb A1c Recent Labs    10/06/20 1813  HGBA1C 7.6*   Lipid Profile Recent Labs    10/07/20 0543  CHOL 131  HDL 40*  LDLCALC 63  TRIG 142  CHOLHDL 3.3   Thyroid function studies Recent Labs    10/06/20 1911  TSH 1.561   Anemia work up No results for input(s): VITAMINB12, FOLATE, FERRITIN, TIBC, IRON, RETICCTPCT in the last 72 hours. Microbiology Recent Results (from the past 240 hour(s))  Resp Panel by RT-PCR (Flu A&B, Covid) Nasopharyngeal Swab     Status: None   Collection Time: 10/06/20  3:37 PM   Specimen: Nasopharyngeal Swab; Nasopharyngeal(NP) swabs in vial transport medium  Result Value Ref Range Status   SARS Coronavirus 2 by RT PCR NEGATIVE NEGATIVE Final    Comment: (NOTE) SARS-CoV-2 target nucleic acids are NOT DETECTED.  The SARS-CoV-2 RNA is generally detectable in upper respiratory specimens during the acute phase of infection. The lowest concentration of SARS-CoV-2 viral copies this assay can detect is 138 copies/mL. A negative result does not preclude SARS-Cov-2 infection and should not be used as the sole basis for treatment or other patient management decisions. A negative result may occur with  improper specimen collection/handling, submission of specimen other than nasopharyngeal swab, presence of viral mutation(s) within the areas targeted by this assay, and inadequate number of viral copies(<138 copies/mL). A negative result must be combined  with clinical observations, patient history, and epidemiological information. The expected result is Negative.  Fact Sheet for Patients:  EntrepreneurPulse.com.au  Fact Sheet for Healthcare Providers:  IncredibleEmployment.be  This test is no t yet approved or cleared by the [place] FDA and  has been authorized for detection and/or diagnosis of SARS-CoV-2 by FDA under an Emergency Use Authorization (EUA). This EUA will remain  in effect (meaning this test can be used) for the duration of the COVID-19 declaration under Section 564(b)(1) of the Act, 21 U.S.C.section 360bbb-3(b)(1), unless the authorization is terminated  or revoked sooner.       Influenza A by PCR NEGATIVE NEGATIVE Final   Influenza  B by PCR NEGATIVE NEGATIVE Final    Comment: (NOTE) The Xpert Xpress SARS-CoV-2/FLU/RSV plus assay is intended as an aid in the diagnosis of influenza from Nasopharyngeal swab specimens and should not be used as a sole basis for treatment. Nasal washings and aspirates are unacceptable for Xpert Xpress SARS-CoV-2/FLU/RSV testing.  Fact Sheet for Patients: EntrepreneurPulse.com.au  Fact Sheet for Healthcare Providers: IncredibleEmployment.be  This test is not yet approved or cleared by the [place] FDA and has been authorized for detection and/or diagnosis of SARS-CoV-2 by FDA under an Emergency Use Authorization (EUA). This EUA will remain in effect (meaning this test can be used) for the duration of the COVID-19 declaration under Section 564(b)(1) of the Act, 21 U.S.C. section 360bbb-3(b)(1), unless the authorization is terminated or revoked.  Performed at [facility], [address] [place], [place] [address]      Discharge Instructions:   Discharge Instructions    Diet - low sodium heart healthy   Complete by: As directed    Diet Carb Modified   Complete by: As directed     Discharge wound care:   Complete by: As directed    Keep wound clean and dry   Increase activity slowly   Complete by: As directed      Allergies as of 10/08/2020      Reactions   Penicillins Rash      Medication List    STOP taking these medications   acetaminophen 325 MG tablet Commonly known as: TYLENOL   ascorbic acid 500 MG tablet Commonly known as: VITAMIN C   gemfibrozil 600 MG tablet Commonly known as: LOPID   guaiFENesin-codeine 100-10 MG/5ML syrup   metoprolol succinate 50 MG 24 hr tablet Commonly known as: TOPROL-XL   vitamin B-12 1000 MCG tablet Commonly known as: CYANOCOBALAMIN   zinc sulfate 220 (50 Zn) MG capsule     TAKE these medications   albuterol 108 (90 Base) MCG/ACT inhaler Commonly known as: VENTOLIN HFA Inhale 2 puffs into the lungs every 6 (six) hours as needed for wheezing or shortness of breath.   aspirin EC 81 MG tablet Take 1 tablet (81 mg total) by mouth daily. Swallow whole.   budesonide-formoterol 160-4.5 MCG/ACT inhaler Commonly known as: SYMBICORT Inhale 2 puffs into the lungs 2 (two) times daily.   carvedilol 6.25 MG tablet Commonly known as: COREG Take 1 tablet (6.25 mg total) by mouth 2 (two) times daily with a meal.   escitalopram 10 MG tablet Commonly known as: Lexapro Take one tab po qd for anxiety with food   ferrous gluconate 324 MG tablet Commonly known as: FERGON Take 1 tablet (324 mg total) by mouth daily with breakfast.   furosemide 40 MG tablet Commonly known as: LASIX Take one to tabs once or twice days for fluid // swelling   gabapentin 100 MG capsule Commonly known as: NEURONTIN Take 1 capsule (100 mg total) by mouth in the morning, at noon, and at bedtime.   glimepiride 2 MG tablet Commonly known as: AMARYL Take one tab po qd with supper   hydrALAZINE 100 MG tablet Commonly known as: APRESOLINE Take 1 tablet (100 mg total) by mouth 3 (three) times daily.   insulin glargine (1 Unit Dial) 300  UNIT/ML Solostar Pen Commonly known as: TOUJEO Inject 26 Units into the skin daily.   Insulin Pen Needle 32G X 6 MM Misc Use as directed   ipratropium-albuterol 0.5-2.5 (3) MG/3ML Soln Commonly known as: DUONEB Take 3 mLs  by nebulization every 6 (six) hours as needed.   isosorbide mononitrate 30 MG 24 hr tablet Commonly known as: IMDUR Take 1 tablet (30 mg total) by mouth 2 (two) times daily. What changed: when to take this   mupirocin ointment 2 % Commonly known as: BACTROBAN Apply 1 application topically daily. Qd to excision site   omeprazole 40 MG capsule Commonly known as: PRILOSEC Take 1 capsule (40 mg total) by mouth in the morning and at bedtime.   potassium chloride SA 20 MEQ tablet Commonly known as: KLOR-CON Take 1 tablet (20 mEq total) by mouth 2 (two) times daily.   rosuvastatin 10 MG tablet Commonly known as: CRESTOR Take 1 tablet (10 mg total) by mouth daily.   Spiriva HandiHaler 18 MCG inhalation capsule Generic drug: tiotropium Place 1 capsule (18 mcg total) into inhaler and inhale daily.   True Metrix Air Glucose Meter Devi 1 Device by Does not apply route 2 (two) times daily.   True Metrix Blood Glucose Test test strip Generic drug: glucose blood Use as instructed twice daily diag E11.65   True Metrix Level 1 Low Soln Use as directed   TRUEplus Lancets 33G Misc Use as directed twice daily diag E11.65            Discharge Care Instructions  (From admission, onward)         Start     Ordered   10/08/20 0000  Discharge wound care:       Comments: Keep wound clean and dry   10/08/20 1323          Follow-up Information    [name], MD. Schedule an appointment as soon as possible for a visit in 2 week(s).   Specialty: Cardiology Contact information: [place] [address] [phone]                Time coordinating discharge: 27 minutes  Signed:  Jennye Boroughs  [facility] 10/08/2020, 1:24 PM   Pager on [email or web address]. If 7PM-7AM, please contact night-coverage at [email or web address]

## 2020-10-08 NOTE — Progress Notes (Signed)
Progress Note  Patient Name: Larry Callahan Date of Encounter: 10/08/2020  Primary Cardiologist: [name], MD  Subjective   No chest pain or dyspnea.  For stress imaging today. BP improved.  Inpatient Medications    Scheduled Meds: . aspirin EC  81 mg Oral Daily  . carvedilol  6.25 mg Oral BID WC  . ferrous gluconate  324 mg Oral Q breakfast  . furosemide  40 mg Oral Daily  . gabapentin  100 mg Oral TID  . gemfibrozil  600 mg Oral BID  . hydrALAZINE  100 mg Oral TID  . insulin aspart  0-5 Units Subcutaneous QHS  . insulin aspart  0-9 Units Subcutaneous TID WC  . insulin glargine  26 Units Subcutaneous QHS  . ipratropium-albuterol  3 mL Nebulization Q6H  . isosorbide mononitrate  30 mg Oral Daily  . mometasone-formoterol  2 puff Inhalation BID  . nitroGLYCERIN  1 inch Topical Once  . pantoprazole  40 mg Oral Daily  . rosuvastatin  10 mg Oral Daily  . tiotropium  18 mcg Inhalation Daily   Continuous Infusions: . heparin 1,750 Units/hr (10/07/20 2145)   PRN Meds: acetaminophen, albuterol, dextromethorphan-guaiFENesin, hydrALAZINE, hydrOXYzine, morphine injection, nitroGLYCERIN   Vital Signs    Vitals:   10/07/20 2023 10/08/20 0159 10/08/20 0407 10/08/20 0729  BP:   (!) 165/75 (!) 171/83  Pulse:   61 63  Resp:   20 18  Temp:   98.3 F (36.8 C) 98.4 F (36.9 C)  TempSrc:      SpO2: 96% 97% 96% 98%  Weight:      Height:        Intake/Output Summary (Last 24 hours) at 10/08/2020 0945 Last data filed at 10/07/2020 1800 Gross per 24 hour  Intake 457.21 ml  Output 325 ml  Net 132.21 ml   Filed Weights   10/06/20 1408 10/06/20 1854 10/07/20 0500  Weight: 123.8 kg 125.1 kg 125.1 kg    Physical Exam   GEN: Obese, in no acute distress.  HEENT: Grossly normal.  Neck: Supple, obese, difficult to gauge JVP. No carotid bruits, or masses. Cardiac: RRR, no murmurs, rubs, or gallops. No clubbing, cyanosis, edema.  Radials 2+, DP/PT 1+ and equal  bilaterally.  Respiratory:  Respirations regular and unlabored, clear to auscultation bilaterally. GI: Obese, soft, nontender, nondistended, BS + x 4. MS: no deformity or atrophy. Skin: warm and dry, no rash. Neuro:  Strength and sensation are intact. Psych: AAOx3.  Normal affect.  Labs    Chemistry Recent Labs  Lab 10/06/20 1428 10/07/20 0543  NA 138 138  K 4.0 4.4  CL 109 108  CO2 20* 21*  GLUCOSE 115* 171*  BUN 38* 36*  CREATININE 2.10* 2.09*  CALCIUM 9.1 9.2  PROT 7.1  --   ALBUMIN 3.2*  --   AST 21  --   ALT 18  --   ALKPHOS 81  --   BILITOT 0.4  --   GFRNONAA 34* 34*  ANIONGAP 9 9     Hematology Recent Labs  Lab 10/06/20 1428 10/07/20 0543 10/08/20 0606  WBC 6.5 5.1 5.1  RBC 3.73* 3.92* 3.71*  HGB 10.7* 11.3* 10.6*  HCT 32.9* 34.5* 32.9*  MCV 88.2 88.0 88.7  MCH 28.7 28.8 28.6  MCHC 32.5 32.8 32.2  RDW 13.7 13.8 13.7  PLT 173 170 157    Cardiac Enzymes  Recent Labs  Lab 09/18/20 1433 10/06/20 1428 10/06/20 1813 10/06/20 1911 10/06/20 2144  TROPONINIHS 18* 23* 26* 26* 25*      BNP Recent Labs  Lab 10/06/20 1428  BNP 81.7     Lipids  Lab Results  Component Value Date   CHOL 131 10/07/2020   HDL 40 (L) 10/07/2020   LDLCALC 63 10/07/2020   TRIG 142 10/07/2020   CHOLHDL 3.3 10/07/2020    HbA1c  Lab Results  Component Value Date   HGBA1C 7.6 (H) 10/06/2020    Radiology    DG Chest Portable 1 View  Result Date: 10/06/2020 CLINICAL DATA:  Chest pain EXAM: PORTABLE CHEST 1 VIEW COMPARISON:  September 18, 2020 FINDINGS: Interstitium is thickened without edema or consolidation evident. Heart is borderline enlarged with pulmonary vascularity normal. No adenopathy. No bone lesions. IMPRESSION: Relative interstitial thickening which may indicate a degree of underlying bronchitis. No edema or consolidation. Heart borderline enlarged. No adenopathy appreciable. Electronically Signed   By: Lowella Grip III M.D.   On: 10/06/2020 14:58     Telemetry    Seen in nuc med - RSR - Personally Reviewed  Cardiac Studies   2D Echocardiogram 4.16.2022   1. Left ventricular ejection fraction, by estimation, is 35 to 40%. The left ventricle has moderately decreased function. The left ventricle demonstrates global hypokinesis. The left ventricular internal cavity size was mildly dilated. Left ventricular diastolic parameters were normal.   2. Right ventricular systolic function is normal. The right ventricular size is normal.   3. The mitral valve is normal in structure. Mild mitral valve  regurgitation.   4. The aortic valve is normal in structure. Aortic valve regurgitation is  not visualized.  _____________    Patient Profile     65 y.o. 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  Assessment & Plan    1.  Chest pain/Elevated HsTrop:  Pt admitted 4/26 w/ chest tightness and mild HsTrop elevation w/ flat trend - peak 26 - likely nonspecific but does not reflect ACS.  Recent echo 09/2020 w/ decline in EF to 35-40%.  Scheduled for myoview 4/27, however we were unable to complete stress portion of test due to marked hypertension.  No c/p or dyspnea overnight.  BP better this AM.  Stress test just performed and he tolerated well.  Images pending.  Further recs pending imaging.  Cont asa,  blocker, nitrate, and statin. D/c heparin.  2.  Essential HTN:  BP markedly elevated yesterday, trending better today - 130's to 150's.  Cont current doses of carvedilol and hydralazine.  Will change imdur to 30 BID.  We noted that he was previously on  amlodipine 10mg  daily.  This fell off of his med list sometime between 04/2020 and 07/2020.  I cannot find a note detailing why it might have been d/c'd and the pt isn't sure either.  Though he has a h/o CHF and lower ext edema, this has been well-controlled w/ lasix and he can't recall being told to stop amlodipine b/c of swelling.  We will consider resuming if necessary.  3.  Cardiomyopathy/HFrEF:  Echo 4/16 w/ reduction in EF to 35-40% (was 45-50% 04/2019 and 55-60% 02/2019).  Ischemic  eval as above - avoiding cath 2/2 CKD III w/ creat 2.09.  Body habitus makes exam challenging but appears euvolemic.  Minus 1.8L since admission.  Cont  blocker, hydral/nitrate, oral lasix.  No acei/arni/arb/mra/sglt2i 2/2 CKD.  4.  CKD III:  Stable on oral lasix.  Avoiding nephrotoxic agents.  5.  DMII:  A1c 7.6.  Insulin mgmt per IM.  6.  HL:  LDL @ goal.  Cont statin rx.  7.  Normocytic anemia:  In setting of #4.  Stable.  8.  Morbid obesity:  Would benefit from outpt nutritional counseling.  9. OSA:  Refused CPAP last night.  Signed, Murray Hodgkins, NP  10/08/2020, 9:45 AM    For questions or updates, please contact   Please consult [email or web address] for contact info under Cardiology/STEMI.

## 2020-10-08 NOTE — TOC Transition Note (Signed)
Transition of Care [facility]) - CM/SW Discharge Note   Patient Details  Name: Larry Callahan MRN: [id number] Date of Birth: Apr 26, 1956  [facility]) CM/SW Contact:  Kerin Salen, RN Phone Number: 10/08/2020, 3:09 PM   Clinical Narrative:  Patient to be discharged to home today. Given BP cuff to take home to use and monitor BP readings. Patient was shown how to use cuff and how to write results. Patient states BP results reviewed already with him by heart nurse. No other TOC needs at this time.           Patient Goals and CMS Choice        Discharge Placement                       Discharge Plan and Services                                     Social Determinants of Health (SDOH) Interventions     Readmission Risk Interventions Readmission Risk Prevention Plan 02/01/2020 01/31/2020 05/10/2019  Transportation Screening Complete Complete Complete  Medication Review Press photographer) Complete Complete Complete  PCP or Specialist appointment within 3-5 days of discharge Complete Complete -  [place] or Home Care Consult Complete Complete -  SW Recovery Care/Counseling Consult Complete Complete -  SW Consult Not Complete Comments - - -  Palliative Care Screening Not Applicable Not Applicable Not Applicable  Skilled Nursing Facility Not Applicable Not Applicable Not Applicable  Some recent data might be hidden

## 2020-10-08 NOTE — TOC Progression Note (Signed)
Transition of Care [facility]) - Progression Note    Patient Details  Name: Larry Callahan MRN: [id number] Date of Birth: 1956/05/05  Transition of Care [facility]) CM/SW [place], RN Phone Number: 10/08/2020, 12:11 PM  Clinical Narrative:  Unable to assess for TOC needs due to patient being off the floor for Stress Test. Will attempt at a later time.         Expected Discharge Plan and Services                                                 Social Determinants of Health (SDOH) Interventions    Readmission Risk Interventions Readmission Risk Prevention Plan 02/01/2020 01/31/2020 05/10/2019  Transportation Screening Complete Complete Complete  Medication Review Press photographer) Complete Complete Complete  PCP or Specialist appointment within 3-5 days of discharge Complete Complete -  [place] or Home Care Consult Complete Complete -  SW Recovery Care/Counseling Consult Complete Complete -  SW Consult Not Complete Comments - - -  Palliative Care Screening Not Applicable Not Applicable Not Applicable  Skilled Nursing Facility Not Applicable Not Applicable Not Applicable  Some recent data might be hidden

## 2020-10-08 NOTE — Consult Note (Signed)
[place] for Heparin  Indication: chest pain/ACS  Patient Measurements: Heparin Dosing Weight: 103 kg  Labs: Recent Labs    10/06/20 1428 10/06/20 1813 10/06/20 1911 10/06/20 2144 10/06/20 2144 10/07/20 0543 10/07/20 1440 10/07/20 2117 10/08/20 0606  HGB 10.7*  --   --   --   --  11.3*  --   --  10.6*  HCT 32.9*  --   --   --   --  34.5*  --   --  32.9*  PLT 173  --   --   --   --  170  --   --  157  APTT  --  140*  --   --   --   --   --   --   --   LABPROT  --  14.0  --   --   --   --   --   --   --   INR  --  1.1  --   --   --   --   --   --   --   HEPARINUNFRC  --   --   --  0.20*   < > 0.27* 0.40 0.36 0.32  CREATININE 2.10*  --   --   --   --  2.09*  --   --   --   TROPONINIHS 23* 26* 26* 25*  --   --   --   --   --    < > = values in this interval not displayed.    Estimated Creatinine Clearance: 46.2 mL/min (A) (by C-G formula based on SCr of 2.09 mg/dL (H)).   Medical History: Past Medical History:  Diagnosis Date  . Allergy   . Coronary artery disease   . Diabetes mellitus without complication ([place])   . Diastolic CHF ([place])   . GERD (gastroesophageal reflux disease)   . Hyperlipidemia   . Hypertension   . Renal insufficiency   . Sleep apnea    Infusions:  . heparin 1,750 Units/hr (10/07/20 2145)    Assessment: 65yo male w/ h/o HTN, CHF (45-50%), CKD3b, T2DM, & COPD presenting with hypertensive urgency and chest pain. Pharmacy consulted to dose heparin for ACS x48hrs.. Planning to avoid LHC since CKD 2/2 left nephrectomy. Heparin Dosing Weight: 103 kg  Baseline CBC with Hgb 10.7, platelets 173. Baseline INR 1.1, aPTT 140 (drawn after initiation of heparin infusion).   4/26: HL @ 2144 HL 0.2 @ 1350 units/hr 4/27:  HL @ 0543 = 0.27 @ 1550 units/hr 4/27:  HL @ 1440 = 0.40 @ 1750 units/hr  - THERAPEUTIC X 1 4/27: HL @ 2117 = 0.36 @ 1750 units/hr - THERAPEUTIC X 2 4/28: HL @ 0606 = 0.32 @ 1750 units/hr - THERAPEUTIC  X 3  Labs: Hgb 10.7>11.3>10.6; Plts LLN; Trop 18>23>25 Goal of Therapy:  Heparin level 0.3-0.7 units/ml Monitor platelets by anticoagulation protocol: Yes   Plan:  Heparin level therapeutic x3; to complete 48hr tonight per Cards (4/28) Will continue drip rate @ 1750 units/hr.  Will recheck HL tomorrow AM if remaining on. CBC's daily while on heparin drip   Lorna Dibble 10/08/2020 7:32 AM

## 2020-10-11 ENCOUNTER — Other Ambulatory Visit: Payer: Self-pay

## 2020-10-11 IMAGING — CR DG CHEST 2V
1 series · 2 of 2 positions shown · non-contrast
Comparison: Chest radiograph February 20, 2019

CLINICAL DATA: Patient with shortness of breath.

EXAM:
CHEST - 2 VIEW

[Series 1: dg chest 2 view · 0.14mm/px · 2 of 2 slices shown]
[im 1/2]
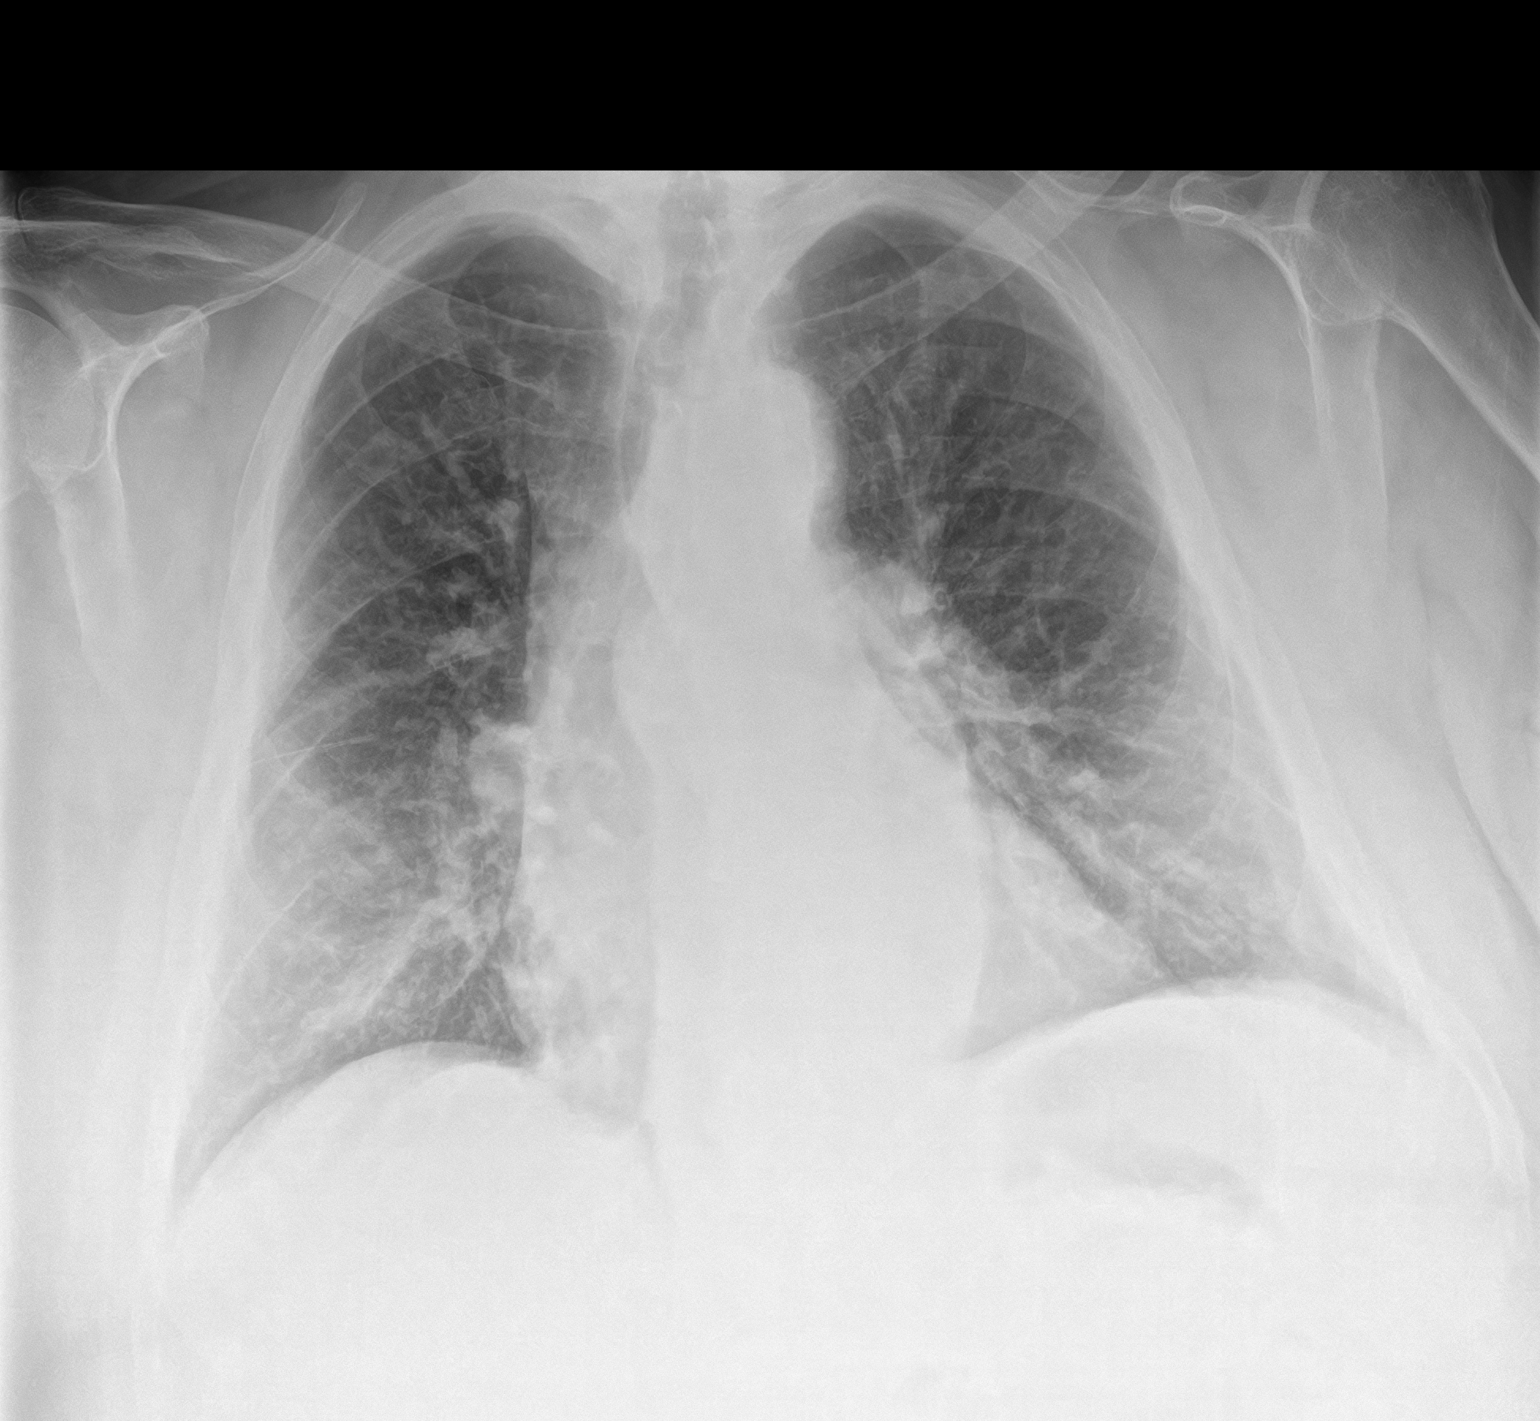
[im 2/2]
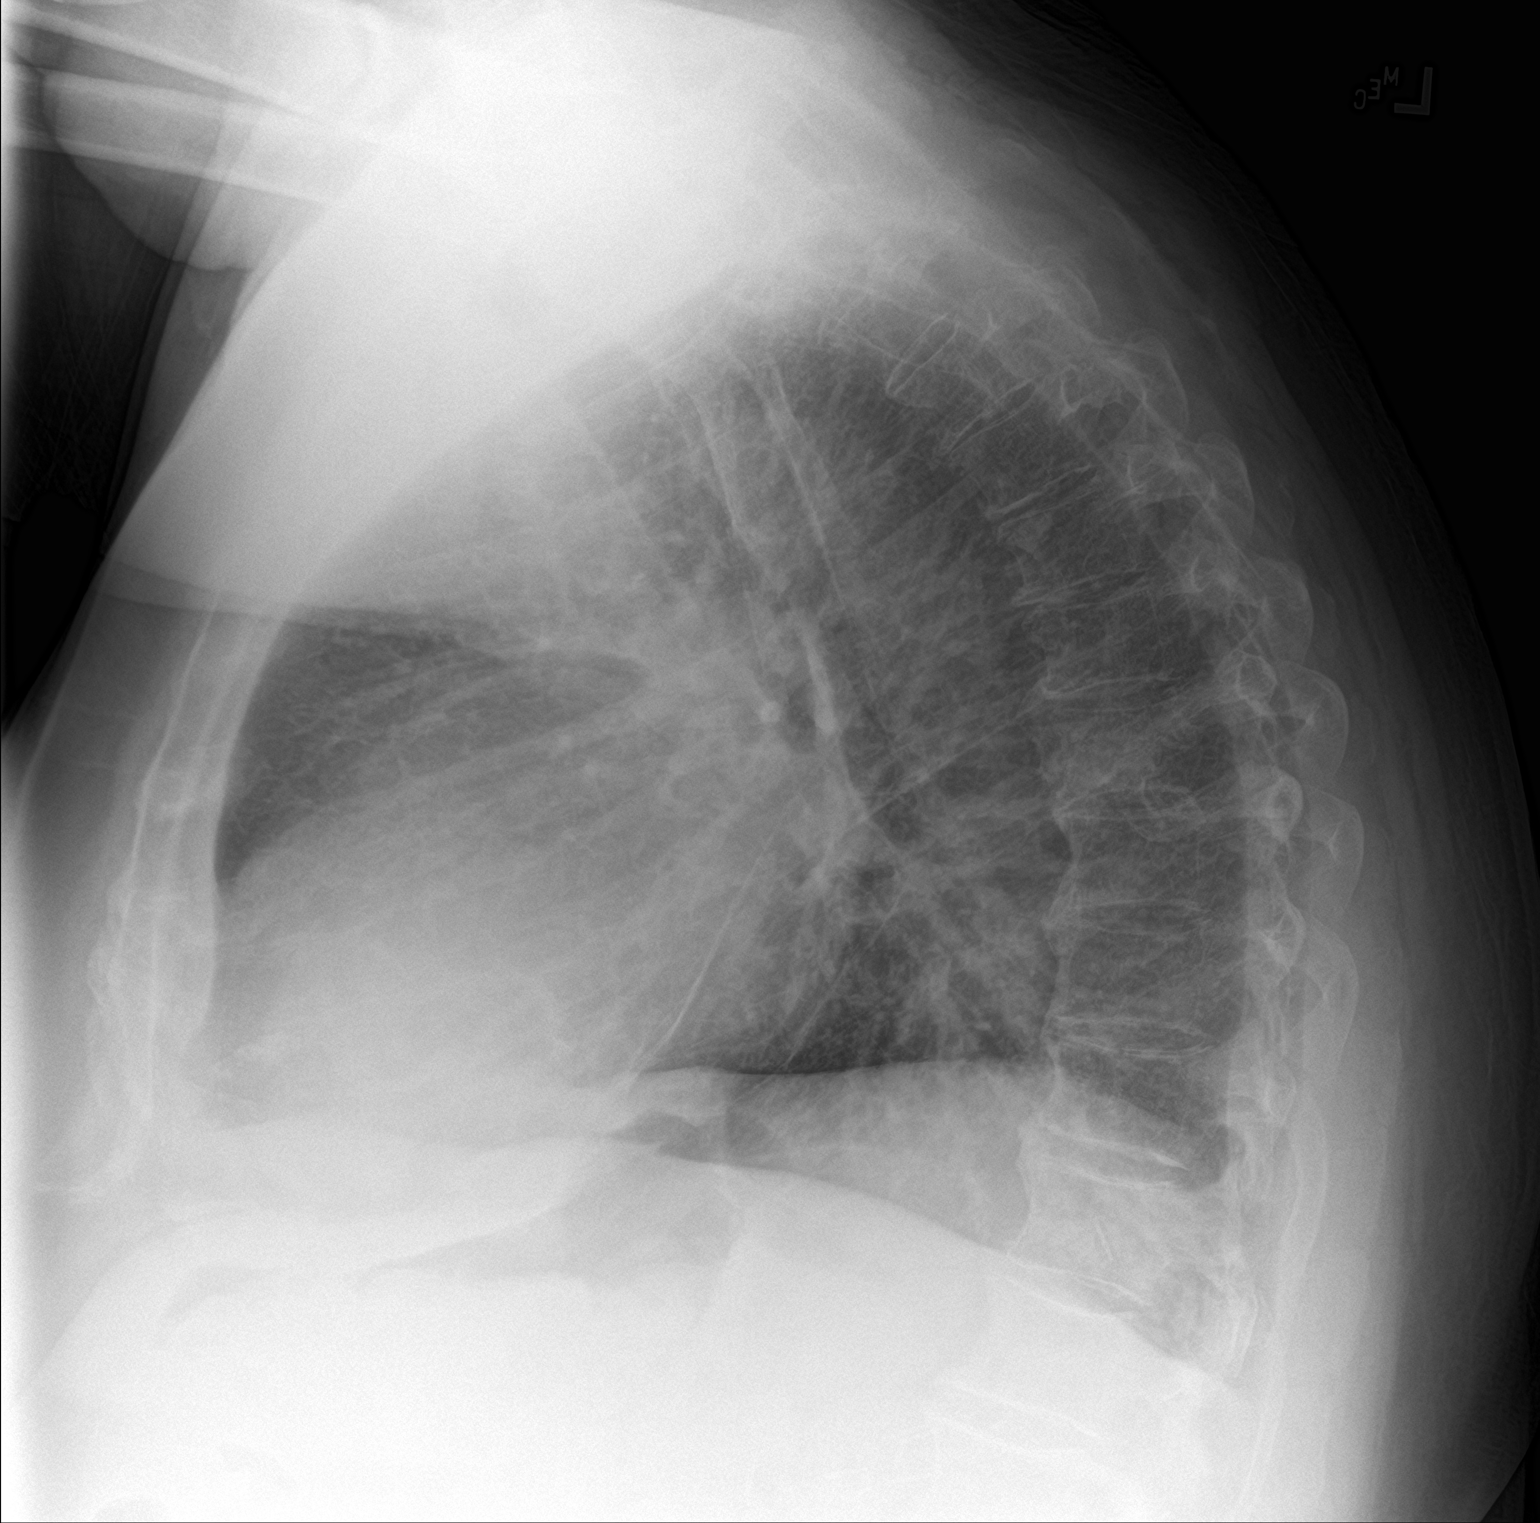

[2 of 2 positions shown; findings below may reference images not displayed]

FINDINGS: Stable cardiac and mediastinal contours. Aortic tortuosity.
Pulmonary vascular redistribution and mild interstitial opacities.
Additionally, there is patchy consolidation within the right lower
lung. No pleural effusion or pneumothorax. Thoracic spine
degenerative changes.
IMPRESSION: Patchy consolidation right mid and lower lung may represent
atelectasis or infection.

Cardiomegaly with pulmonary vascular redistribution.

## 2020-10-11 MED ORDER — IPRATROPIUM-ALBUTEROL 0.5-2.5 (3) MG/3ML IN SOLN: 3.0000 mL | Freq: Four times a day (QID) | RESPIRATORY_TRACT | 3 refills | Status: DC | PRN

## 2020-10-12 ENCOUNTER — Other Ambulatory Visit: Payer: Self-pay | Admitting: Nurse Practitioner

## 2020-10-12 ENCOUNTER — Telehealth: Payer: Self-pay

## 2020-10-12 NOTE — Telephone Encounter (Signed)
LM on VM please return my call  

## 2020-10-12 NOTE — Telephone Encounter (Signed)
-----   Message from Ralene Bathe, MD sent at 10/12/2020 11:42 AM EDT ----- Diagnosis 1. Skin (M), right posterior axilla EPIDERMAL INCLUSION CYST 2. Skin (M), right inferior axilla SUPPURATIVE GRANULOMATOUS INFLAMMATION, SEE DESCRIPTION  1- benign cyst 2- benign inflamed ruptured cyst

## 2020-10-13 ENCOUNTER — Ambulatory Visit (INDEPENDENT_AMBULATORY_CARE_PROVIDER_SITE_OTHER): Payer: Medicare PPO | Admitting: Dermatology

## 2020-10-13 ENCOUNTER — Other Ambulatory Visit: Payer: Self-pay

## 2020-10-13 ENCOUNTER — Other Ambulatory Visit: Payer: Self-pay | Admitting: Dermatology

## 2020-10-13 DIAGNOSIS — D485 Neoplasm of uncertain behavior of skin: Secondary | ICD-10-CM | POA: Diagnosis not present

## 2020-10-13 DIAGNOSIS — L918 Other hypertrophic disorders of the skin: Secondary | ICD-10-CM | POA: Diagnosis not present

## 2020-10-13 DIAGNOSIS — D492 Neoplasm of unspecified behavior of bone, soft tissue, and skin: Secondary | ICD-10-CM

## 2020-10-13 DIAGNOSIS — L820 Inflamed seborrheic keratosis: Secondary | ICD-10-CM | POA: Diagnosis not present

## 2020-10-13 DIAGNOSIS — L720 Epidermal cyst: Secondary | ICD-10-CM

## 2020-10-13 NOTE — Patient Instructions (Signed)
If you have any questions or concerns for your doctor, please call our main line at [phone] and press option 4 to reach your doctor's medical assistant. If no one answers, please leave a voicemail as directed and we will return your call as soon as possible. Messages left after 4 pm will be answered the following business day.   You may also send us a message via MyChart. We typically respond to MyChart messages within 1-2 business days.  For prescription refills, please ask your pharmacy to contact our office. Our fax number is [phone].  If you have an urgent issue when the clinic is closed that cannot wait until the next business day, you can page your doctor at the number below.    Please note that while we do our best to be available for urgent issues outside of office hours, we are not available 24/7.   If you have an urgent issue and are unable to reach us, you may choose to seek medical care at your doctor's office, retail clinic, urgent care center, or emergency room.  If you have a medical emergency, please immediately call 911 or go to the emergency department.  Pager Numbers  - Dr. Kowalski: [phone]  - Dr. Moye: [phone]  - Dr. Stewart: [phone]  In the event of inclement weather, please call our main line at [phone] for an update on the status of any delays or closures.  Dermatology Medication Tips: Please keep the boxes that topical medications come in in order to help keep track of the instructions about where and how to use these. Pharmacies typically print the medication instructions only on the boxes and not directly on the medication tubes.   If your medication is too expensive, please contact our office at [phone] option 4 or send us a message through MyChart.   We are unable to tell what your co-pay for medications will be in advance as this is different depending on your insurance coverage. However, we may be able to find a substitute  medication at lower cost or fill out paperwork to get insurance to cover a needed medication.   If a prior authorization is required to get your medication covered by your insurance company, please allow us 1-2 business days to complete this process.  Drug prices often vary depending on where the prescription is filled and some pharmacies may offer cheaper prices.  The website www.goodrx.com contains coupons for medications through different pharmacies. The prices here do not account for what the cost may be with help from insurance (it may be cheaper with your insurance), but the website can give you the price if you did not use any insurance.  - You can print the associated coupon and take it with your prescription to the pharmacy.  - You may also stop by our office during regular business hours and pick up a GoodRx coupon card.  - If you need your prescription sent electronically to a different pharmacy, notify our office through [place] [facility] MyChart or by phone at [phone] option 4.   

## 2020-10-13 NOTE — Progress Notes (Signed)
Follow-Up Visit   Subjective  Larry Callahan is a 65 y.o. male who presents for the following: suture removal x 2 (Pathology proven benign cysts  of the right axilla - patient is here today for suture removal ), Lesion (L upper eyelid - irritated, disturbs vision, patient would like it removed), and check growth (R lower eyelid, irritated).  The following portions of the chart were reviewed this encounter and updated as appropriate:   Tobacco  Allergies  Meds  Problems  Med Hx  Surg Hx  Fam Hx     Review of Systems:  No other skin or systemic complaints except as noted in HPI or Assessment and Plan.  Objective  Well appearing patient in no apparent distress; mood and affect are within normal limits.  A focused examination was performed including R axilla, face. Relevant physical exam findings are noted in the Assessment and Plan.  Objective  Right lower eyelid  margin x 1: Erythematous keratotic or waxy stuck-on papule or plaque.   Objective  Left upper eyelid: 0.5cm flesh colored pap  Objective  Right post axilla, R inf axilla: Healing excision sites   Assessment & Plan  Inflamed seborrheic keratosis Right lower eyelid  margin x 1  Destruction of lesion - Right lower eyelid  margin x 1 Complexity: simple   Destruction method: cryotherapy   Informed consent: discussed and consent obtained   Timeout:  patient name, date of birth, surgical site, and procedure verified Lesion destroyed using liquid nitrogen: Yes   Region frozen until ice ball extended beyond lesion: Yes   Outcome: patient tolerated procedure well with no complications   Post-procedure details: wound care instructions given    Neoplasm of skin Left upper eyelid  Skin / nail biopsy Type of biopsy: tangential   Informed consent: discussed and consent obtained   Timeout: patient name, date of birth, surgical site, and procedure verified   Procedure prep:  Patient was prepped and draped in  usual sterile fashion Prep type:  Isopropyl alcohol Anesthesia: the lesion was anesthetized in a standard fashion   Anesthetic:  1% lidocaine w/ epinephrine 1-100,000 buffered w/ 8.4% NaHCO3 Instrument used: flexible razor blade   Outcome: patient tolerated procedure well   Post-procedure details: sterile dressing applied and wound care instructions given   Dressing type: bandage and bacitracin    Specimen 1 - Surgical pathology Differential Diagnosis: D48.5 ISK vs Skin tag vs other  Check Margins: No 0.5cm flesh colored pap, symptomatic and blocking vision  SK vs Skin tag vs other, bx today  Other Related Medications mupirocin ointment (BACTROBAN) 2 %  Epidermal cyst Right post axilla, R inf axilla  Bx proven  Encounter for Removal of Sutures - Incision site at the R post axilla and R inf axilla is clean, dry and intact - Wound cleansed, sutures removed, wound cleansed and steri strips applied.  - Discussed pathology results showing Cyst  - Patient advised to keep steri-strips dry until they fall off. - Scars remodel for a full year. - Once steri-strips fall off, patient can apply over-the-counter silicone scar cream each night to help with scar remodeling if desired. - Patient advised to call with any concerns or if they notice any new or changing lesions.   Return in about 4 months (around 02/13/2021) for recheck ISK R upper eyelid margin.   I, [name], RMA, am acting as scribe for [name], MD .  Documentation: I have reviewed the above documentation for accuracy and  completeness, and I agree with the above.  [name], MD

## 2020-10-15 ENCOUNTER — Emergency Department: Payer: Medicare PPO

## 2020-10-15 ENCOUNTER — Emergency Department
Admission: EM | Admit: 2020-10-15 | Discharge: 2020-10-16 | Disposition: A | Discharge status: Home / Self Care | Payer: Medicare PPO | Attending: Emergency Medicine | Admitting: Emergency Medicine

## 2020-10-15 ENCOUNTER — Other Ambulatory Visit: Payer: Self-pay

## 2020-10-15 ENCOUNTER — Ambulatory Visit (INDEPENDENT_AMBULATORY_CARE_PROVIDER_SITE_OTHER): Payer: Medicare PPO | Admitting: Dermatology

## 2020-10-15 ENCOUNTER — Other Ambulatory Visit: Payer: Self-pay | Admitting: Internal Medicine

## 2020-10-15 ENCOUNTER — Ambulatory Visit: Payer: Medicare PPO | Admitting: Internal Medicine

## 2020-10-15 ENCOUNTER — Ambulatory Visit: Payer: Medicare PPO | Admitting: Hospice and Palliative Medicine

## 2020-10-15 ENCOUNTER — Encounter: Payer: Self-pay | Admitting: Dermatology

## 2020-10-15 DIAGNOSIS — M79651 Pain in right thigh: Secondary | ICD-10-CM | POA: Diagnosis not present

## 2020-10-15 DIAGNOSIS — M79604 Pain in right leg: Secondary | ICD-10-CM | POA: Insufficient documentation

## 2020-10-15 DIAGNOSIS — Z20822 Contact with and (suspected) exposure to covid-19: Secondary | ICD-10-CM | POA: Insufficient documentation

## 2020-10-15 DIAGNOSIS — N183 Chronic kidney disease, stage 3 unspecified: Secondary | ICD-10-CM | POA: Insufficient documentation

## 2020-10-15 DIAGNOSIS — Z794 Long term (current) use of insulin: Secondary | ICD-10-CM | POA: Diagnosis not present

## 2020-10-15 DIAGNOSIS — R0602 Shortness of breath: Secondary | ICD-10-CM | POA: Insufficient documentation

## 2020-10-15 DIAGNOSIS — S41101A Unspecified open wound of right upper arm, initial encounter: Secondary | ICD-10-CM

## 2020-10-15 DIAGNOSIS — J449 Chronic obstructive pulmonary disease, unspecified: Secondary | ICD-10-CM | POA: Insufficient documentation

## 2020-10-15 DIAGNOSIS — R0789 Other chest pain: Secondary | ICD-10-CM | POA: Diagnosis not present

## 2020-10-15 DIAGNOSIS — I130 Hypertensive heart and chronic kidney disease with heart failure and stage 1 through stage 4 chronic kidney disease, or unspecified chronic kidney disease: Secondary | ICD-10-CM | POA: Diagnosis not present

## 2020-10-15 DIAGNOSIS — Z87891 Personal history of nicotine dependence: Secondary | ICD-10-CM | POA: Diagnosis not present

## 2020-10-15 DIAGNOSIS — E1122 Type 2 diabetes mellitus with diabetic chronic kidney disease: Secondary | ICD-10-CM | POA: Insufficient documentation

## 2020-10-15 DIAGNOSIS — Z8616 Personal history of COVID-19: Secondary | ICD-10-CM | POA: Diagnosis not present

## 2020-10-15 DIAGNOSIS — R079 Chest pain, unspecified: Secondary | ICD-10-CM | POA: Diagnosis not present

## 2020-10-15 DIAGNOSIS — R0600 Dyspnea, unspecified: Secondary | ICD-10-CM | POA: Diagnosis not present

## 2020-10-15 DIAGNOSIS — Z85528 Personal history of other malignant neoplasm of kidney: Secondary | ICD-10-CM | POA: Insufficient documentation

## 2020-10-15 DIAGNOSIS — T148XXA Other injury of unspecified body region, initial encounter: Secondary | ICD-10-CM

## 2020-10-15 DIAGNOSIS — I10 Essential (primary) hypertension: Secondary | ICD-10-CM | POA: Diagnosis not present

## 2020-10-15 DIAGNOSIS — I5033 Acute on chronic diastolic (congestive) heart failure: Secondary | ICD-10-CM | POA: Diagnosis not present

## 2020-10-15 DIAGNOSIS — I2510 Atherosclerotic heart disease of native coronary artery without angina pectoris: Secondary | ICD-10-CM | POA: Insufficient documentation

## 2020-10-15 DIAGNOSIS — R042 Hemoptysis: Secondary | ICD-10-CM | POA: Diagnosis not present

## 2020-10-15 DIAGNOSIS — R590 Localized enlarged lymph nodes: Secondary | ICD-10-CM | POA: Diagnosis not present

## 2020-10-15 LAB — COMPREHENSIVE METABOLIC PANEL
ALT: 21 U/L (ref 0–44)
AST: 28 U/L (ref 15–41)
Albumin: 3.3 g/dL — ABNORMAL LOW (ref 3.5–5.0)
Alkaline Phosphatase: 62 U/L (ref 38–126)
Anion gap: 9 (ref 5–15)
BUN: 49 mg/dL — ABNORMAL HIGH (ref 8–23)
CO2: 20 mmol/L — ABNORMAL LOW (ref 22–32)
Calcium: 8.7 mg/dL — ABNORMAL LOW (ref 8.9–10.3)
Chloride: 111 mmol/L (ref 98–111)
Creatinine, Ser: 2.48 mg/dL — ABNORMAL HIGH (ref 0.61–1.24)
GFR, Estimated: 28 mL/min — ABNORMAL LOW (ref >60)
Glucose, Bld: 107 mg/dL — ABNORMAL HIGH (ref 70–99)
Potassium: 4.6 mmol/L (ref 3.5–5.1)
Sodium: 140 mmol/L (ref 135–145)
Total Bilirubin: 0.6 mg/dL (ref 0.3–1.2)
Total Protein: 6.8 g/dL (ref 6.5–8.1)

## 2020-10-15 LAB — CBC WITH DIFFERENTIAL/PLATELET
Abs Immature Granulocytes: 0.04 10*3/uL (ref 0.00–0.07)
Basophils Absolute: 0.1 10*3/uL (ref 0.0–0.1)
Basophils Relative: 1 %
Eosinophils Absolute: 0.2 10*3/uL (ref 0.0–0.5)
Eosinophils Relative: 2 %
HCT: 31.8 % — ABNORMAL LOW (ref 39.0–52.0)
Hemoglobin: 10.7 g/dL — ABNORMAL LOW (ref 13.0–17.0)
Immature Granulocytes: 1 %
Lymphocytes Relative: 24 %
Lymphs Abs: 1.6 10*3/uL (ref 0.7–4.0)
MCH: 29.2 pg (ref 26.0–34.0)
MCHC: 33.6 g/dL (ref 30.0–36.0)
MCV: 86.9 fL (ref 80.0–100.0)
Monocytes Absolute: 0.5 10*3/uL (ref 0.1–1.0)
Monocytes Relative: 7 %
Neutro Abs: 4.4 10*3/uL (ref 1.7–7.7)
Neutrophils Relative %: 65 %
Platelets: 176 10*3/uL (ref 150–400)
RBC: 3.66 MIL/uL — ABNORMAL LOW (ref 4.22–5.81)
RDW: 13.7 % (ref 11.5–15.5)
WBC: 6.7 10*3/uL (ref 4.0–10.5)
nRBC: 0 % (ref 0.0–0.2)

## 2020-10-15 LAB — BASIC METABOLIC PANEL
Anion gap: 8 (ref 5–15)
BUN: 51 mg/dL — ABNORMAL HIGH (ref 8–23)
CO2: 20 mmol/L — ABNORMAL LOW (ref 22–32)
Calcium: 9.1 mg/dL (ref 8.9–10.3)
Chloride: 114 mmol/L — ABNORMAL HIGH (ref 98–111)
Creatinine, Ser: 2.55 mg/dL — ABNORMAL HIGH (ref 0.61–1.24)
GFR, Estimated: 27 mL/min — ABNORMAL LOW (ref >60)
Glucose, Bld: 111 mg/dL — ABNORMAL HIGH (ref 70–99)
Potassium: 4.7 mmol/L (ref 3.5–5.1)
Sodium: 142 mmol/L (ref 135–145)

## 2020-10-15 LAB — PROCALCITONIN: Procalcitonin: <0.1 ng/mL

## 2020-10-15 LAB — RESP PANEL BY RT-PCR (FLU A&B, COVID) ARPGX2
Influenza A by PCR: NEGATIVE
Influenza B by PCR: NEGATIVE
SARS Coronavirus 2 by RT PCR: NEGATIVE

## 2020-10-15 LAB — D-DIMER, QUANTITATIVE: D-Dimer, Quant: 1.14 ug/mL-FEU — ABNORMAL HIGH (ref 0.00–0.50)

## 2020-10-15 LAB — TROPONIN I (HIGH SENSITIVITY)
Troponin I (High Sensitivity): 18 ng/L — ABNORMAL HIGH (ref <18)
Troponin I (High Sensitivity): 19 ng/L — ABNORMAL HIGH (ref <18)

## 2020-10-15 MED ORDER — SODIUM CHLORIDE 0.9 % IV BOLUS
1000.0000 mL | Freq: Once | INTRAVENOUS | Status: AC
  Administered 2020-10-15: 1000 mL via INTRAVENOUS

## 2020-10-15 MED ORDER — INSULIN PEN NEEDLE 32G X 6 MM MISC: 1 refills | Status: DC

## 2020-10-15 NOTE — ED Notes (Signed)
Pt given snacks per request and informed he will be NPO at midnight for procedure in AM.

## 2020-10-15 NOTE — ED Triage Notes (Signed)
Pt from home via EMS with multiple complaints. He c/o shortness of breath and a left sided chest pain when he takes a deep breath. Also reports same pain but in his right lateral abdomen/flank. Pt tried his home neb x2 and didn't feel improved. He also c/o several days of right upper leg pain - no trauma or injury

## 2020-10-15 NOTE — Patient Instructions (Signed)
Wound Care Instructions  1. Cleanse wound gently with soap and water once a day then pat dry with clean gauze. Apply a thing coat of Petrolatum (petroleum jelly, "Vaseline") over the wound (unless you have an allergy to this). We recommend that you use a new, sterile tube of Vaseline. Do not pick or remove scabs. Do not remove the yellow or white "healing tissue" from the base of the wound.  2. Cover the wound with fresh, clean, nonstick gauze and secure with paper tape. You may use Band-Aids in place of gauze and tape if the would is small enough, but would recommend trimming much of the tape off as there is often too much. Sometimes Band-Aids can irritate the skin.  3. You should call the office for your biopsy report after 1 week if you have not already been contacted.  4. If you experience any problems, such as abnormal amounts of bleeding, swelling, significant bruising, significant pain, or evidence of infection, please call the office immediately.  5. FOR ADULT SURGERY PATIENTS: If you need something for pain relief you may take 1 extra strength Tylenol (acetaminophen) AND 2 Ibuprofen (200mg each) together every 4 hours as needed for pain. (do not take these if you are allergic to them or if you have a reason you should not take them.) Typically, you may only need pain medication for 1 to 3 days.     

## 2020-10-15 NOTE — ED Provider Notes (Signed)
[facility] Emergency Department Provider Note  ____________________________________________   I have reviewed the triage vital signs and the nursing notes.   HISTORY  Chief Complaint Shortness of Breath   History limited by: Poor historian   HPI Larry Callahan is a 65 y.o. male who presents to the emergency department today for primary complaint of shortness of breath.  At least patient states that is what made him call 911.  However he initially started talking in complaining of some right upper leg pain.  It is unclear how long this has been going on.  He does state that the pain has been severe and it has made it hard for him to walk.  He then did start talking about his shortness of breath with he stated did have a somewhat chronic in nature and states he is seeing his cardiologist about it.  It is unclear if anything had changed with that shortness of breath that made him want to call 911.  But he did state that was the reason he called 911.  He states that the shortness of breath has been associated with some right chest pain.  Again it is slightly unclear how long this chest based has been going on but sounds like at least a few days but the patient himself is not sure.  The patient additionally states that he had an episode of hematemesis.  Patient denies any fevers.   Records reviewed. Per medical record review patient has a history of CAD, DM, HLD, HTN.   Past Medical History:  Diagnosis Date  . Allergy   . Coronary artery disease   . Diabetes mellitus without complication ([place])   . Diastolic CHF ([place])   . GERD (gastroesophageal reflux disease)   . Hyperlipidemia   . Hypertension   . Renal insufficiency   . Sleep apnea     Patient Active Problem List   Diagnosis Date Noted  . Chest pain 10/06/2020  . HLD (hyperlipidemia) 10/06/2020  . CVA (cerebral vascular accident) ([place]) 09/25/2020  . Gastroesophageal reflux disease without  esophagitis 04/19/2020  . Pneumonia due to COVID-19 virus 01/30/2020  . Elevated troponin 01/29/2020  . Suspected COVID-19 virus infection 01/29/2020  . Postural dizziness with presyncope 01/29/2020  . Hematemesis 01/09/2020  . Hospital discharge follow-up 11/26/2019  . Cellulitis of left hand 09/26/2019  . Generalized weakness 08/08/2019  . Obesity (BMI 30-39.9) 08/08/2019  . Acute right hip pain 06/17/2019  . Inability to ambulate due to hip 06/17/2019  . CKD stage 3 due to type 2 diabetes mellitus ([place])   . Hypervolemia   . Acute on chronic diastolic CHF (congestive heart failure) ([place])   . Type 2 diabetes mellitus with stage 3b chronic kidney disease, with long-term current use of insulin ([place])   . Hypomagnesemia   . Full code status 05/20/2019  . Pleuritic chest pain   . Multifocal pneumonia 05/19/2019  . Acute kidney injury superimposed on CKD 3b ([place]) 05/09/2019  . Hemoptysis 04/09/2019  . Shortness of breath 03/26/2019  . Uncontrolled type 2 diabetes mellitus with insulin therapy ([place]) 03/12/2019  . Acute on chronic heart failure with preserved ejection fraction (HFpEF) ([place])   . Chronic kidney disease, stage 3b ([place])   . Chronic systolic CHF (congestive heart failure) ([place]) 03/01/2019  . Diastolic dysfunction 60/45/4098  . Iron deficiency anemia 12/31/2018  . Atopic dermatitis 11/24/2018  . Screening for colon cancer 11/06/2017  . Uncontrolled type 2 diabetes mellitus with hyperglycemia ([place]) 11/06/2017  .  Hidradenitis 06/09/2017  . Atherosclerotic heart disease of native coronary artery without angina pectoris 06/09/2017  . 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 Obstructive sleep apnea, adult 06/09/2017  . Morbid (severe) obesity due to excess calories ([place]) 06/09/2017  . Cigarette nicotine dependence with nicotine-induced disorder 06/09/2017  . Diabetic polyneuropathy associated with type 2 diabetes mellitus ([place]) 06/09/2017  .  Allergic rhinitis due to pollen 06/09/2017  . Mixed hyperlipidemia 06/09/2017  . COPD (chronic obstructive pulmonary disease) ([place]) 06/09/2017  . Dyspnea on exertion 06/09/2017  . Wheezing 06/09/2017  . Dysuria 06/09/2017  . Snoring 06/09/2017  . Essential hypertension 06/09/2017  . Personal history of other malignant neoplasm of kidney 06/09/2017  . Pain in right hip 06/09/2017  . Impacted cerumen, bilateral 06/09/2017  . Secondary hyperparathyroidism, not elsewhere classified ([place]) 06/09/2017  . Type 2 diabetes mellitus with hyperglycemia ([place]) 06/09/2017  . Tinea corporis 06/09/2017    Past Surgical History:  Procedure Laterality Date  . BACK SURGERY    . CARDIAC CATHETERIZATION    . CHOLECYSTECTOMY    . COLONOSCOPY WITH PROPOFOL N/A 04/12/2019   Procedure: COLONOSCOPY WITH PROPOFOL;  Surgeon: [name], MD;  Location: [facility] ENDOSCOPY;  Service: Gastroenterology;  Laterality: N/A;  . ESOPHAGOGASTRODUODENOSCOPY N/A 04/12/2019   Procedure: ESOPHAGOGASTRODUODENOSCOPY (EGD);  Surgeon: [name], MD;  Location: [facility] ENDOSCOPY;  Service: Gastroenterology;  Laterality: N/A;  . ESOPHAGOGASTRODUODENOSCOPY (EGD) WITH PROPOFOL N/A 11/21/2019   Procedure: ESOPHAGOGASTRODUODENOSCOPY (EGD) WITH PROPOFOL;  Surgeon: [name], MD;  Location: [facility] ENDOSCOPY;  Service: Gastroenterology;  Laterality: N/A;  . FLEXIBLE BRONCHOSCOPY Bilateral 05/17/2019   Procedure: FLEXIBLE BRONCHOSCOPY;  Surgeon: [name], MD;  Location: [name] ORS;  Service: Pulmonary;  Laterality: Bilateral;  . left arm surgery    . nephrectomy Left   . PARATHYROIDECTOMY    . RIGHT HEART CATH N/A 03/29/2019   Procedure: RIGHT HEART CATH;  Surgeon: [name], MD;  Location: [facility] CV LAB;  Service: Cardiovascular;  Laterality: N/A;    Prior to Admission medications   Medication Sig Start Date End Date Taking? Authorizing Provider  albuterol (VENTOLIN HFA) 108 (90 Base) MCG/ACT  inhaler Inhale 2 puffs into the lungs every 6 (six) hours as needed for wheezing or shortness of breath. 08/31/20   Lavera Guise, MD  aspirin EC 81 MG tablet Take 1 tablet (81 mg total) by mouth daily. Swallow whole. 09/26/20 09/26/21  Nita Sells, MD  Blood Glucose Calibration (TRUE METRIX LEVEL 1) Low SOLN Use as directed 04/04/18   [name], NP  Blood Glucose Monitoring Suppl (TRUE METRIX AIR GLUCOSE METER) DEVI 1 Device by Does not apply route 2 (two) times daily. 04/13/18   [name], NP  budesonide-formoterol (SYMBICORT) 160-4.5 MCG/ACT inhaler Inhale 2 puffs into the lungs 2 (two) times daily. 07/20/20   Lavera Guise, MD  carvedilol (COREG) 6.25 MG tablet Take 1 tablet (6.25 mg total) by mouth 2 (two) times daily with a meal. 10/08/20   Jennye Boroughs, MD  escitalopram (LEXAPRO) 10 MG tablet Take one tab po qd for anxiety with food 07/17/20   Lavera Guise, MD  ferrous gluconate (FERGON) 324 MG tablet Take 1 tablet (324 mg total) by mouth daily with breakfast. 08/01/19   [name], NP  furosemide (LASIX) 40 MG tablet Take one to tabs once or twice days for fluid // swelling 07/17/20   Lavera Guise, MD  gabapentin (NEURONTIN) 100 MG capsule  Take 1 capsule (100 mg total) by mouth in the morning, at noon, and at bedtime. 07/17/20   Lavera Guise, MD  glimepiride Jari Sportsman) 2 MG tablet Take one tab po qd with supper 07/17/20   Lavera Guise, MD  glucose blood (TRUE METRIX BLOOD GLUCOSE TEST) test strip Use as instructed twice daily diag E11.65 07/30/19   [name], NP  hydrALAZINE (APRESOLINE) 100 MG tablet Take 1 tablet (100 mg total) by mouth 3 (three) times daily. 07/17/20   Lavera Guise, MD  insulin glargine, 1 Unit Dial, (TOUJEO) 300 UNIT/ML Solostar Pen Inject 26 Units into the skin daily. 04/30/20   [name], NP  ipratropium-albuterol (DUONEB) 0.5-2.5 (3) MG/3ML SOLN Take 3 mLs by nebulization every 6 (six) hours as needed. 10/11/20   Lavera Guise, MD   isosorbide mononitrate (IMDUR) 30 MG 24 hr tablet Take 1 tablet (30 mg total) by mouth 2 (two) times daily. 10/08/20   Jennye Boroughs, MD  mupirocin ointment (BACTROBAN) 2 % Apply 1 application topically daily. Qd to excision site 10/06/20   Ralene Bathe, MD  NOVOFINE PEN NEEDLE 32G X 6 MM MISC Use as directed 10/15/20   Lavera Guise, MD  omeprazole (PRILOSEC) 40 MG capsule Take 1 capsule (40 mg total) by mouth in the morning and at bedtime. 07/17/20   Lavera Guise, MD  potassium chloride SA (KLOR-CON) 20 MEQ tablet Take 1 tablet (20 mEq total) by mouth 2 (two) times daily. 07/17/20   Lavera Guise, MD  rosuvastatin (CRESTOR) 10 MG tablet Take 1 tablet (10 mg total) by mouth daily. 07/17/20 10/15/20  Lavera Guise, MD  tiotropium (SPIRIVA HANDIHALER) 18 MCG inhalation capsule Place 1 capsule (18 mcg total) into inhaler and inhale daily. 06/02/20 08/31/20  [name], NP  TRUEplus Lancets 33G MISC Use as directed twice daily diag E11.65 07/30/19   [name], NP    Allergies Penicillins  Family History  Problem Relation Age of Onset  . Diabetes Mother        2003  . Lung cancer Father   . Diabetes Sister   . Hypertension Sister   . Heart disease Sister   . Cancer Sister   . Bone cancer Brother     Social History Social History   Tobacco Use  . Smoking status: Former Smoker    Types: Cigarettes  . Smokeless tobacco: Former Systems developer    Types: Secondary school teacher  . Vaping Use: Never used  Substance Use Topics  . Alcohol use: No  . Drug use: No    Review of Systems Constitutional: No fever/chills Eyes: No visual changes. ENT: No sore throat. Cardiovascular: Positive for right sided chest pain. Respiratory: Positive for shortness of breath. Positive for bloody cough. Gastrointestinal: No abdominal pain.  No nausea, no vomiting.  No diarrhea.   Genitourinary: Negative for dysuria. Musculoskeletal: Positive for right leg pain. Skin: Positive for right armpit wound.   Neurological: Negative for headaches, focal weakness or numbness.  ____________________________________________   PHYSICAL EXAM:  VITAL SIGNS: ED Triage Vitals  Enc Vitals Group     BP 10/15/20 1508 (!) 151/82     Pulse Rate 10/15/20 1508 (!) 106     Resp 10/15/20 1508 19     Temp 10/15/20 1508 98.6 F (37 C)     Temp Source 10/15/20 1508 Oral     SpO2 10/15/20 1508 97 %     Weight 10/15/20 1518  260 lb (117.9 kg)     Height 10/15/20 1518 _0  (1.803 m)     Head Circumference --      Peak Flow --      Pain Score 10/15/20 1509 9   Constitutional: Alert and oriented.  Eyes: Conjunctivae are normal.  ENT      Head: Normocephalic and atraumatic.      Nose: No congestion/rhinnorhea.      Mouth/Throat: Mucous membranes are moist.      Neck: No stridor. Hematological/Lymphatic/Immunilogical: No cervical lymphadenopathy. Cardiovascular: Normal rate, regular rhythm.  No murmurs, rubs, or gallops.  Respiratory: Slightly increased respiratory effort. Poor air movement diffusely.  Gastrointestinal: Soft and non tender. No rebound. No guarding.  Genitourinary: Deferred Musculoskeletal: Normal range of motion in all extremities. No lower extremity edema. Neurologic:  Normal speech and language. No gross focal neurologic deficits are appreciated.  Skin:  Skin is warm, dry and intact. No rash noted. Psychiatric: Mood and affect are normal. Speech and behavior are normal. Patient exhibits appropriate insight and judgment.  ____________________________________________    LABS (pertinent positives/negatives)  CBC wbc 6.7, hgb 10.7, plt 176 D-dimer 1.14 Trop hs 18 BMP na 142, k 4.7, glu 111, cr 2.55 ____________________________________________   EKG  I, Nance Pear, attending physician, personally viewed and interpreted this EKG  EKG Time: 1607 Rate: 77 Rhythm: sinus rhythm Axis: normal Intervals: qtc 391 QRS: narrow, q waves III, v1 ST changes: no st  elevation Impression: abnormal ekg   ____________________________________________    RADIOLOGY  CXR Left basilar focal pulmonary infiltrate  ____________________________________________   PROCEDURES  Procedures  ____________________________________________   INITIAL IMPRESSION / ASSESSMENT AND PLAN / ED COURSE  Pertinent labs & imaging results that were available during my care of the patient were reviewed by me and considered in my medical decision making (see chart for details).   Patient presented to the emergency department today because of concern for some shortness of breath. He additionally had complaint of some right sided chest pain and right leg pain. Did have concern for possible PE unfortunately the patient could not undergo CT angio given kidney function. The patient d-dimer was elevated. Underwent US of the right leg to evaluate for DVT and this was negative. Did order VQ scan to further evaluate. CXR was concerning for possible infiltrate however patient afebrile, no leukocytosis, and procalcitonin negative. Doubt infection at this time.   ____________________________________________   FINAL CLINICAL IMPRESSION(S) / ED DIAGNOSES  Final diagnoses:  SOB (shortness of breath)  Right leg pain     Note: This dictation was prepared with Dragon dictation. Any transcriptional errors that result from this process are unintentional     Nance Pear, MD 10/15/20 [phone]

## 2020-10-15 NOTE — Progress Notes (Signed)
   Follow-Up Visit   Subjective  Larry Callahan is a 65 y.o. male who presents for the following: Follow-up (1 week f/u post  biopsy proven cyst surgery at the Right posterior axilla ). Pt report this area was bleeding last night, he would like to have this area checked.   The following portions of the chart were reviewed this encounter and updated as appropriate:   Tobacco  Allergies  Meds  Problems  Med Hx  Surg Hx  Fam Hx     Review of Systems:  No other skin or systemic complaints except as noted in HPI or Assessment and Plan.  Objective  Well appearing patient in no apparent distress; mood and affect are within normal limits.  A focused examination was performed including face, right axilla. Relevant physical exam findings are noted in the Assessment and Plan.  Objective  Right posterior axilla: Open wound healing well, no sign of infection    Assessment & Plan  Open wound - dehiscence (common with Hidradenitis) Right posterior axilla  Open wound/post cyst excision  Cleansed skin with Puracyn, applied gel foam into open wound area, applied mupirocin ointment on non stick pad, pressure dressing applied   Return if symptoms worsen or fail to improve.  IMarye Round, CMA, am acting as scribe for [name], MD .  Documentation: I have reviewed the above documentation for accuracy and completeness, and I agree with the above.  [name], MD

## 2020-10-16 ENCOUNTER — Emergency Department: Payer: Medicare PPO

## 2020-10-16 ENCOUNTER — Encounter: Payer: Self-pay | Admitting: Radiology

## 2020-10-16 DIAGNOSIS — R0602 Shortness of breath: Secondary | ICD-10-CM | POA: Diagnosis not present

## 2020-10-16 MED ORDER — TECHNETIUM TO 99M ALBUMIN AGGREGATED
4.3700 | Freq: Once | INTRAVENOUS | Status: AC | PRN
  Administered 2020-10-16: 4.37 via INTRAVENOUS

## 2020-10-16 NOTE — ED Provider Notes (Signed)
Procedures     ----------------------------------------- 12:07 PM on 10/16/2020 ----------------------------------------- Vitals remained stable.  Tolerating p.o.  VQ scan is low risk.  Stable for discharge.     Carrie Mew, MD 10/16/20 1207

## 2020-10-16 NOTE — ED Notes (Signed)
Pt states he cant sit in the bed all night cause it hurts his back and bottom. Pt offered recliner. Pt given recliner and head and legs adjusted to pt comfort level. Call light placed within reach. Lights dimmed for rest. Pt denies further needs at this time.

## 2020-10-16 NOTE — ED Notes (Signed)
Patient requesting food/drink at this time. Patient informed EDP will have to evaluate scan prior to eating.

## 2020-10-16 NOTE — ED Notes (Signed)
RN to bedside to answer call light. Pt states he needs to eat/drink because his stomach is weak. Dr Joni Fears aware

## 2020-10-16 NOTE — ED Provider Notes (Addendum)
  Physical Exam  BP (!) 155/85   Pulse 65   Temp 98.6 F (37 C) (Oral)   Resp (!) 22   Ht 5\' 11"  (1.803 m)   Wt 117.9 kg   SpO2 99%   BMI 36.26 kg/m   Physical Exam  ED Course/Procedures     Procedures  MDM  12:00 AM  Assumed care.  Patient is a 65 year old male who presents to the emergency department with complaints of SOB and R chest pain, RLE pain, doppler negative, elevated d dimer, ? PNA on CXR but normal procal and white count and no infectious symptoms.  Previous provider thinks pneumonia is unlikely.  VQ scan pending.  Troponin x2 flat.   6:55 AM  Pt has been resting comfortably without complaints overnight.  Continues to be hemodynamically stable.  Signed out to oncoming physician to follow-up on VQ scan which will be done in the morning.   I reviewed all nursing notes and pertinent previous records as available.  I have reviewed and interpreted any EKGs, lab and urine results, imaging (as available).        Yeilin Zweber, Delice Bison, DO 10/16/20 [phone]

## 2020-10-16 NOTE — ED Notes (Signed)
Pt ambulatory to restroom

## 2020-10-16 NOTE — Discharge Instructions (Addendum)
Your lab tests and imaging were all okay today. Please continue to follow up with your doctor for further evaluation of your symptoms.

## 2020-10-18 NOTE — Progress Notes (Deleted)
Cardiology Office Note    Date:  10/18/2020   ID:  Bensen Chadderdon Kugel, DOB 01/02/1956, MRN [id number]  PCP:  Lavera Guise, MD  Cardiologist:  [name], MD  Electrophysiologist:  None   Chief Complaint: Hospital follow-up  History of Present Illness:   Larry Callahan is a 65 y.o. male with history of nonobstructive CAD by LHC in 2011, chronic combined systolic and diastolic CHF, pulmonary hypertension, CKD stage III status post left sided nephrectomy with a baseline serum creatinine around 2, IDDM2, HTN, iron deficiency anemia, prior tobacco use, OSA on CPAP, and morbid obesity who presents for hospital follow-up as outlined below.  He was evaluated in 2020, after several ED visits, for ongoing dyspnea with CTA of the chest being negative for PE with noted aortic atherosclerosis and coronary artery calcifications.  Echo showed an EF of 55 to 60%, mild LVH, diastolic dysfunction, normal RV systolic function and ventricular cavity size, severely dilated left atrium, trivial tricuspid regurgitation, and mild aortic valve sclerosis without stenosis.  Lexiscan MPI showed no significant ischemia and was read as high risk secondary to LVEF less than 30%.  Due to persisting symptoms he was again admitted in 2020 with VQ scan being low risk for PE.  RHC showed normal right heart pressures.  Symptoms were felt to be multifactorial including COPD, obesity, physical deconditioning, and anemia.  He was again admitted in late 2020 with continued dyspnea.  Repeat echo showed an EF of 45 to 50%, mild LVH, normal RV systolic function and ventricular cavity size, moderately dilated left atrium, mild to moderate aortic valve sclerosis without stenosis.  He was last seen in the office in 08/2019.  Since then, he has had numerous ER visits for various complaints.  Please see EMR for further details.  He was admitted to the hospital twice in 09/2020, initially for TIA.  His CT showed no acute  intracranial process.  Echo showed an EF of 35 to 40%, global hypokinesis, mildly dilated LV internal cavity size, normal LV diastolic function parameters, normal RV systolic function and ventricular cavity size, and mild mitral regurgitation.  Carotid artery ultrasound showed no significant stenosis of the bilateral ICAs with antegrade flow of the bilateral vertebral arteries.  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  In the ED he was noted to have frequent irregular heartbeats including episodes of WCT.  High-sensitivity troponin was minimally elevated and flat trending with an initial value of 23 and a delta of 26.  He was noted to have frequent PVCs and an accelerated idioventricular rhythm on telemetry.  Lexiscan MPI on 10/08/2020 showed no significant ischemia.  There was a large perfusion defect involving the inferior wall that was mildly better with stress than rest with images concerning for diaphragmatic attenuation artifact though unable to exclude old inferior MI.  Overall this was read as a moderate risk scan given depressed EF.  Medical therapy was advised.  He returned to the ED on 5/5 with complaints of shortness of breath as well as right chest discomfort, and right upper leg discomfort.  Work-up in the ED demonstrated a high-sensitivity troponin of 18 with a delta of 19 and a D-dimer elevated at 1.14.  Chest x-ray demonstrated a left basilar focal pulmonary infiltrate felt to be likely infectious or inflammatory.  Lower extremity ultrasound was negative for DVT.  Plain film of the right femur was  nonacute.  VQ scan was negative for PE.  EKG showed sinus with PACs.  He was discharged from the ED with outpatient follow-up advised.  ***   Labs independently reviewed: 10/2020 - potassium 4.6, BUN 49, serum creatinine 2.48, albumin 3.3, AST/ALT normal, Hgb 10.7, PLT 176 09/2020 - TC 131, TG 142, HDL 40, LDL  63, TSH normal, magnesium 1.9, A1c 7.7  Past Medical History:  Diagnosis Date  . Allergy   . Coronary artery disease   . Diabetes mellitus without complication ([place])   . Diastolic CHF ([place])   . GERD (gastroesophageal reflux disease)   . Hyperlipidemia   . Hypertension   . Renal insufficiency   . Sleep apnea     Past Surgical History:  Procedure Laterality Date  . BACK SURGERY    . CARDIAC CATHETERIZATION    . CHOLECYSTECTOMY    . COLONOSCOPY WITH PROPOFOL N/A 04/12/2019   Procedure: COLONOSCOPY WITH PROPOFOL;  Surgeon: [name], MD;  Location[facility] ENDOSCOPY;  Service: Gastroenterology;  Laterality: N/A;  . ESOPHAGOGASTRODUODENOSCOPY N/A 04/12/2019   Procedure: ESOPHAGOGASTRODUODENOSCOPY (EGD);  Surgeon: [name], MD;  Location: [facility] ENDOSCOPY;  Service: Gastroenterology;  Laterality: N/A;  . ESOPHAGOGASTRODUODENOSCOPY (EGD) WITH PROPOFOL N/A 11/21/2019   Procedure: ESOPHAGOGASTRODUODENOSCOPY (EGD) WITH PROPOFOL;  Surgeon: [name], MD;  Location: [facility] ENDOSCOPY;  Service: Gastroenterology;  Laterality: N/A;  . FLEXIBLE BRONCHOSCOPY Bilateral 05/17/2019   Procedure: FLEXIBLE BRONCHOSCOPY;  Surgeon: [name], MD;  Location: [name] ORS;  Service: Pulmonary;  Laterality: Bilateral;  . left arm surgery    . nephrectomy Left   . PARATHYROIDECTOMY    . RIGHT HEART CATH N/A 03/29/2019   Procedure: RIGHT HEART CATH;  Surgeon: [name], MD;  Location: [place] CV LAB;  Service: Cardiovascular;  Laterality: N/A;    Current Medications: No outpatient medications have been marked as taking for the 10/21/20 encounter (Appointment) with Rise Mu, PA-C.    Allergies:   Penicillins   Social History   Socioeconomic History  . Marital status: Single    Spouse name: Not on file  . Number of children: Not on file  . Years of education: Not on file  . Highest education level: Not on file  Occupational History  . Not on file  Tobacco  Use  . Smoking status: Former Smoker    Types: Cigarettes  . Smokeless tobacco: Former Systems developer    Types: Secondary school teacher  . Vaping Use: Never used  Substance and Sexual Activity  . Alcohol use: No  . Drug use: No  . Sexual activity: Not on file  Other Topics Concern  . Not on file  Social History Narrative  . Not on file   Social Determinants of Health   Financial Resource Strain: Not on file  Food Insecurity: Not on file  Transportation Needs: Not on file  Physical Activity: Not on file  Stress: Not on file  Social Connections: Not on file     Family History:  The patient's family history includes Bone cancer in his brother; Cancer in his sister; Diabetes in his mother and sister; Heart disease in his sister; Hypertension in his sister; Lung cancer in his father.  ROS:   ROS   EKGs/Labs/Other Studies Reviewed:    Studies reviewed were summarized above. The additional studies were reviewed today:  Lexiscan MPI 10/08/2020: Pharmacological myocardial perfusion imaging study with no significant ischemia Large perfusion defect inferior wall, mildly better with stress than rest,  images concerning for diaphragmatic attenuation artifact though unable to exclude old inferior MI  CT attenuation correction images not available secondary to patient movement GI uptake artifact noted.  Hypokinesis inferoseptal wall, EF estimated at 38% (estimation of ejection fraction likely erroneous in the setting of GI uptake artifact) No EKG changes concerning for ischemia at peak stress or in recovery. Resting EKG with diffuse T wave abnormality, rare PVC Prior CT scan dated August 2021 with coronary calcification most notably in the LAD Moderate risk scan given depressed ejection fraction __________  2D echo 09/26/2020: 1. Left ventricular ejection fraction, by estimation, is 35 to 40%. The  left ventricle has moderately decreased function. The left ventricle  demonstrates global  hypokinesis. The left ventricular internal cavity size  was mildly dilated. Left ventricular  diastolic parameters were normal.  2. Right ventricular systolic function is normal. The right ventricular  size is normal.  3. The mitral valve is normal in structure. Mild mitral valve  regurgitation.  4. The aortic valve is normal in structure. Aortic valve regurgitation is  not visualized. __________  2D echo 04/27/2019: 1. Left ventricular ejection fraction, by visual estimation, is 45 to  50%. The left ventricle has mildly decreased function. There is mildly  increased left ventricular hypertrophy.  2. Left ventricular diastolic parameters are indeterminate.  3. Global right ventricle has normal systolic function.The right  ventricular size is normal. No increase in right ventricular wall  thickness.  4. Left atrial size was moderately dilated.  5. Right atrial size was normal.  6. The mitral valve is normal in structure. No evidence of mitral valve  regurgitation. No evidence of mitral stenosis.  7. The tricuspid valve is normal in structure. Tricuspid valve  regurgitation is not demonstrated.  8. The aortic valve is normal in structure. Aortic valve regurgitation is  not visualized. Mild to moderate aortic valve sclerosis/calcification  without any evidence of aortic stenosis.  9. The pulmonic valve was normal in structure. Pulmonic valve  regurgitation is not visualized.  10. TR signal is inadequate for assessing pulmonary artery systolic  pressure.  11. The inferior vena cava is normal in size with greater than 50%  respiratory variability, suggesting right atrial pressure of 3 mmHg. __________  RHC 03/29/2019: Pulmonary capillary wedge pressure mean of 8 PA: 32/16 mean 23 RA mean of 10 RV 26/12/13   Cardiac output 8.17 Cardiac index 3.42  Final Conclusions:   Normal right heart pressures  Recommendations:  Etiology of his shortness of breath likely  multifactorial He is at euvolemic state at this time, no aggressive diuresis needed Needs referral to pulmonary for COPD work-up, long smoking history He may benefit from lung Works, exercise as needed sedentary lifestyle and is deconditioned -Recommended weight loss, lifestyle modification given obesity contributing to his shortness of breath symptoms __________  2D echo 03/03/2019: 1. Left ventricular ejection fraction, by visual estimation, is 55 to  60%. The left ventricle has normal function. Normal left ventricular size.  Left ventricular septal wall thickness was mildly increased. Mildly  increased left ventricular posterior  wall thickness. There is mildly increased left ventricular hypertrophy.  2. Elevated left ventricular end-diastolic pressure.  3. Left ventricular diastolic Doppler parameters are consistent with  pseudonormalization pattern of LV diastolic filling.  4. Global right ventricle has normal systolic function.The right  ventricular size is normal. No increase in right ventricular wall  thickness.  5. Left atrial size was severely dilated.  6. Right atrial size was normal.  7.  The mitral valve is normal in structure. Trace mitral valve  regurgitation. No evidence of mitral stenosis.  8. The tricuspid valve is normal in structure. Tricuspid valve  regurgitation is trivial.  9. The aortic valve is normal in structure. Aortic valve regurgitation  was not visualized by color flow Doppler. Mild aortic valve sclerosis  without stenosis.  10. The pulmonic valve was normal in structure. Pulmonic valve  regurgitation is trivial by color flow Doppler.  11. Moderately elevated pulmonary artery systolic pressure.  12. The inferior vena cava is dilated in size with >50% respiratory  variability, suggesting right atrial pressure of 8 mmHg.    EKG:  EKG is ordered today.  The EKG ordered today demonstrates ***  Recent Labs: 10/06/2020: B Natriuretic Peptide 81.7;  Magnesium 1.9; TSH 1.561 10/15/2020: ALT 21; BUN 51; BUN 49; Creatinine, Ser 2.55; Creatinine, Ser 2.48; Hemoglobin 10.7; Platelets 176; Potassium 4.7; Potassium 4.6; Sodium 142; Sodium 140  Recent Lipid Panel    Component Value Date/Time   CHOL 131 10/07/2020 0543   CHOL 151 02/27/2018 0844   CHOL 137 12/07/2012 0518   TRIG 142 10/07/2020 0543   TRIG 98 12/07/2012 0518   HDL 40 (L) 10/07/2020 0543   HDL 36 (L) 02/27/2018 0844   HDL 45 12/07/2012 0518   CHOLHDL 3.3 10/07/2020 0543   VLDL 28 10/07/2020 0543   VLDL 20 12/07/2012 0518   LDLCALC 63 10/07/2020 0543   LDLCALC 94 02/27/2018 0844   LDLCALC 72 12/07/2012 0518    PHYSICAL EXAM:    VS:  There were no vitals taken for this visit.  BMI: There is no height or weight on file to calculate BMI.  Physical Exam  Wt Readings from Last 3 Encounters:  10/15/20 260 lb (117.9 kg)  10/07/20 275 lb 12.8 oz (125.1 kg)  09/25/20 272 lb 14.9 oz (123.8 kg)     ASSESSMENT & PLAN:   1. HFrEF secondary to NICM:  2. Nonobstructive CAD involving the native coronary arteries/HLD:  3. HTN: Blood pressure ***  4. CKD stage III:  5. Normocytic anemia:  6. Morbid obesity with OSA:  Disposition: F/u with Dr. Marland Kitchen or an APP in ***.   Medication Adjustments/Labs and Tests Ordered: Current medicines are reviewed at length with the patient today.  Concerns regarding medicines are outlined above. Medication changes, Labs and Tests ordered today are summarized above and listed in the Patient Instructions accessible in Encounters.   Signed, Christell Faith, PA-C 10/18/2020 10:52 AM     [facility] [address] [place] [address] [place], [place] [address] [phone]

## 2020-10-19 ENCOUNTER — Ambulatory Visit: Payer: Medicare PPO | Admitting: Physician Assistant

## 2020-10-19 ENCOUNTER — Encounter: Payer: Self-pay | Admitting: Dermatology

## 2020-10-19 ENCOUNTER — Telehealth: Payer: Self-pay

## 2020-10-19 NOTE — Telephone Encounter (Signed)
-----   Message from Ralene Bathe, MD sent at 10/16/2020 11:34 AM EDT ----- Diagnosis Skin , left upper eyelid MULTIPLE ACROCHORDONS  Benign skin tags

## 2020-10-19 NOTE — Telephone Encounter (Signed)
Left message on voicemail to return my call.  

## 2020-10-20 ENCOUNTER — Telehealth: Payer: Self-pay | Admitting: Cardiovascular Disease

## 2020-10-20 ENCOUNTER — Telehealth: Payer: Self-pay

## 2020-10-20 DIAGNOSIS — I5043 Acute on chronic combined systolic (congestive) and diastolic (congestive) heart failure: Secondary | ICD-10-CM | POA: Diagnosis not present

## 2020-10-20 NOTE — Telephone Encounter (Signed)
Spoke with pt about [facility] called about his BP high he was told is not high advised him to keep track of BP and pulse and call us back we like see reading before we change any med and keep appt with cardiologist and with [name]

## 2020-10-20 NOTE — Telephone Encounter (Signed)
[facility] called that pt BP 160/90 and 2 weeks hospital stopped metoprolol and change carvedilol as per dr Humphrey Rolls lmom on [phone] that before any change on med we like to see pt and also lmom to pt to call us back

## 2020-10-20 NOTE — Telephone Encounter (Signed)
Mary with [facility] calling Patient's provider would like to know if she can increase carvedilol 12.5 BID or add amlodipine 5 MG Patient's BP is low Please call [facility] to discuss

## 2020-10-20 NOTE — Telephone Encounter (Signed)
Attempted to reach out to [facility], unable to get in contact with Larry Callahan, LM on VM advised cannot give out medication instructions w/o provider's order, pt has not been seen in over a year, does have appt later this month with Larry Faith, PA-C who is not in office today. Suggested for low BP try hydration or seek the ED, can reach out to PCP as well. [name] can may call back with any further questions.

## 2020-10-21 ENCOUNTER — Ambulatory Visit: Payer: Medicare PPO | Admitting: Physician Assistant

## 2020-10-27 NOTE — Progress Notes (Signed)
Cardiology Office Note    Date:  10/30/2020   ID:  Paras, Kreider [birth year], MRN [id number]  PCP:  Lavera Guise, MD  Cardiologist:  [name], MD  Electrophysiologist:  None   Chief Complaint: Hospital follow up  History of Present Illness:   Larry Callahan is a 65 y.o. male with history of nonobstructive CAD by [place] in 2011, chronic combined systolic and diastolic CHF, pulmonary hypertension, CKD stage III status post left sided nephrectomy with a baseline serum creatinine around 2, IDDM2, HTN, iron deficiency anemia, prior tobacco use, OSA on CPAP, and morbid obesity who presents for hospital follow-up as outlined below.  He was evaluated in 2020, after several ED visits, for ongoing dyspnea with CTA of the chest being negative for PE with noted aortic atherosclerosis and coronary artery calcifications.  Echo showed an EF of 55 to 60%, mild LVH, diastolic dysfunction, normal RV systolic function and ventricular cavity size, severely dilated left atrium, trivial tricuspid regurgitation, and mild aortic valve sclerosis without stenosis.  Lexiscan MPI showed no significant ischemia and was read as high risk secondary to LVEF less than 30%.  Due to persisting symptoms he was again admitted in 2020 with VQ scan being low risk for PE.  RHC showed normal right heart pressures.  Symptoms were felt to be multifactorial including COPD, obesity, physical deconditioning, and anemia.  He was again admitted in late 2020 with continued dyspnea.  Repeat echo showed an EF of 45 to 50%, mild LVH, normal RV systolic function and ventricular cavity size, moderately dilated left atrium, and mild to moderate aortic valve sclerosis without stenosis.  He was last seen in the office in 08/2019.  Since then, he has had numerous ER visits for various complaints.  Please see EMR for further details.  He was admitted to the hospital twice in 09/2020, initially for TIA.  Head CT showed no acute  intracranial process.  Echo showed an EF of 35 to 40%, global hypokinesis, mildly dilated LV internal cavity size, normal LV diastolic function parameters, normal RV systolic function and ventricular cavity size, and mild mitral regurgitation.  Carotid artery ultrasound showed no significant stenosis of the bilateral ICAs with antegrade flow of the bilateral vertebral arteries.  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  In the ED, he was noted to have frequent irregular heartbeats including episodes of WCT.  High-sensitivity troponin was minimally elevated and flat trending with an initial value of 23 and a delta of 26.  He was noted to have frequent PVCs and an accelerated idioventricular rhythm on telemetry.  Lexiscan MPI on 10/08/2020 showed no significant ischemia.  There was a large perfusion defect involving the inferior wall that was mildly better with stress than rest with images concerning for diaphragmatic attenuation artifact though unable to exclude old inferior MI.  Overall this was read as a moderate risk scan given depressed EF.  Medical therapy was advised.  He returned to the ED on 5/5 with complaints of shortness of breath as well as right chest discomfort, and right upper leg discomfort.  Work-up in the ED demonstrated a high-sensitivity troponin of 18 with a delta of 19 and a D-dimer elevated at 1.14.  Chest x-ray demonstrated a left basilar focal pulmonary infiltrate felt to be likely infectious or inflammatory.  Lower extremity ultrasound was negative for DVT.  Plain film of the right  femur was nonacute.  VQ scan was negative for PE.  EKG showed sinus with PACs.  He was discharged from the ED with outpatient follow-up advised.  He comes in today stating he feels "horrible."  He notes he woke up at 7 AM on 5/19 and took his regular medicines with a glass of water.  He reports after this he fell back  asleep and did not wake up again until 6 AM this morning.  However, he then indicates he had multiple episodes of bloody emesis throughout the day on 5/19 and when he woke up this morning he reports his bedsheet had a "large amount of blood on it."  He continues to cough up blood this morning in the office.  He notes with this there is right-sided to substernal chest pain.  No dizziness, presyncope, or syncope.  Further history taking was significantly hampered by multiple attempts at redirection as the patient went on to discuss paresthesias involving the left foot, his umbilical hernia, and pollen in his eyes.   Labs independently reviewed: 10/2020 - potassium 4.6, BUN 49, serum creatinine 2.48, albumin 3.3, AST/ALT normal, Hgb 10.7, PLT 176 09/2020 - TC 131, TG 142, HDL 40, LDL 63, TSH normal, magnesium 1.9, A1c 7.7    Past Medical History:  Diagnosis Date  . Allergy   . Coronary artery disease   . Diabetes mellitus without complication ([place])   . Diastolic CHF ([place])   . GERD (gastroesophageal reflux disease)   . Hyperlipidemia   . Hypertension   . Renal insufficiency   . Sleep apnea     Past Surgical History:  Procedure Laterality Date  . BACK SURGERY    . CARDIAC CATHETERIZATION    . CHOLECYSTECTOMY    . COLONOSCOPY WITH PROPOFOL N/A 04/12/2019   Procedure: COLONOSCOPY WITH PROPOFOL;  Surgeon: [name], MD;  Location: [facility] ENDOSCOPY;  Service: Gastroenterology;  Laterality: N/A;  . ESOPHAGOGASTRODUODENOSCOPY N/A 04/12/2019   Procedure: ESOPHAGOGASTRODUODENOSCOPY (EGD);  Surgeon: [name], MD;  Location: [facility] ENDOSCOPY;  Service: Gastroenterology;  Laterality: N/A;  . ESOPHAGOGASTRODUODENOSCOPY (EGD) WITH PROPOFOL N/A 11/21/2019   Procedure: ESOPHAGOGASTRODUODENOSCOPY (EGD) WITH PROPOFOL;  Surgeon: [name], MD;  Location: [facility] ENDOSCOPY;  Service: Gastroenterology;  Laterality: N/A;  . FLEXIBLE BRONCHOSCOPY Bilateral 05/17/2019   Procedure: FLEXIBLE  BRONCHOSCOPY;  Surgeon: [name], MD;  Location: [name] ORS;  Service: Pulmonary;  Laterality: Bilateral;  . left arm surgery    . nephrectomy Left   . PARATHYROIDECTOMY    . RIGHT HEART CATH N/A 03/29/2019   Procedure: RIGHT HEART CATH;  Surgeon: [name], MD;  Location: [facility] CV LAB;  Service: Cardiovascular;  Laterality: N/A;    Current Medications: Current Meds  Medication Sig  . albuterol (VENTOLIN HFA) 108 (90 Base) MCG/ACT inhaler Inhale 2 puffs into the lungs every 6 (six) hours as needed for wheezing or shortness of breath.  Marland Kitchen aspirin EC 81 MG tablet Take 1 tablet (81 mg total) by mouth daily. Swallow whole.  . Blood Glucose Calibration (TRUE METRIX LEVEL 1) Low SOLN Use as directed  . Blood Glucose Monitoring Suppl (TRUE METRIX AIR GLUCOSE METER) DEVI 1 Device by Does not apply route 2 (two) times daily.  . budesonide-formoterol (SYMBICORT) 160-4.5 MCG/ACT inhaler Inhale 2 puffs into the lungs 2 (two) times daily.  . carvedilol (COREG) 6.25 MG tablet Take 1 tablet (6.25 mg total) by mouth 2 (two) times daily with a meal.  . escitalopram (LEXAPRO) 10 MG tablet  Take one tab po qd for anxiety with food  . ferrous gluconate (FERGON) 324 MG tablet Take 1 tablet (324 mg total) by mouth daily with breakfast.  . furosemide (LASIX) 40 MG tablet Take one to tabs once or twice days for fluid // swelling  . gabapentin (NEURONTIN) 100 MG capsule Take 1 capsule (100 mg total) by mouth in the morning, at noon, and at bedtime.  Marland Kitchen glimepiride (AMARYL) 2 MG tablet Take one tab po qd with supper  . glucose blood (TRUE METRIX BLOOD GLUCOSE TEST) test strip Use as instructed twice daily diag E11.65  . hydrALAZINE (APRESOLINE) 100 MG tablet Take 1 tablet (100 mg total) by mouth 3 (three) times daily.  . insulin glargine, 1 Unit Dial, (TOUJEO) 300 UNIT/ML Solostar Pen Inject 26 Units into the skin daily.  Marland Kitchen ipratropium-albuterol (DUONEB) 0.5-2.5 (3) MG/3ML SOLN Take 3 mLs by  nebulization every 6 (six) hours as needed.  . isosorbide mononitrate (IMDUR) 30 MG 24 hr tablet Take 1 tablet (30 mg total) by mouth 2 (two) times daily.  . mupirocin ointment (BACTROBAN) 2 % Apply 1 application topically daily. Qd to excision site  . NOVOFINE PEN NEEDLE 32G X 6 MM MISC Use as directed  . omeprazole (PRILOSEC) 40 MG capsule Take 1 capsule (40 mg total) by mouth in the morning and at bedtime.  . potassium chloride SA (KLOR-CON) 20 MEQ tablet Take 1 tablet (20 mEq total) by mouth 2 (two) times daily.  . TRUEplus Lancets 33G MISC Use as directed twice daily diag E11.65    Allergies:   Penicillins   Social History   Socioeconomic History  . Marital status: Single    Spouse name: Not on file  . Number of children: Not on file  . Years of education: Not on file  . Highest education level: Not on file  Occupational History  . Not on file  Tobacco Use  . Smoking status: Former Smoker    Types: Cigarettes  . Smokeless tobacco: Former Systems developer    Types: Secondary school teacher  . Vaping Use: Never used  Substance and Sexual Activity  . Alcohol use: No  . Drug use: No  . Sexual activity: Not on file  Other Topics Concern  . Not on file  Social History Narrative  . Not on file   Social Determinants of Health   Financial Resource Strain: Not on file  Food Insecurity: Not on file  Transportation Needs: Not on file  Physical Activity: Not on file  Stress: Not on file  Social Connections: Not on file     Family History:  The patient's family history includes Bone cancer in his brother; Cancer in his sister; Diabetes in his mother and sister; Heart disease in his sister; Hypertension in his sister; Lung cancer in his father.  ROS:   Review of Systems  Constitutional: Positive for malaise/fatigue. Negative for chills, diaphoresis, fever and weight loss.  HENT: Negative for congestion.   Eyes: Negative for discharge and redness.  Respiratory: Positive for cough, hemoptysis  and shortness of breath. Negative for sputum production and wheezing.   Cardiovascular: Positive for chest pain. Negative for palpitations, orthopnea, claudication, leg swelling and PND.  Gastrointestinal: Positive for vomiting. Negative for abdominal pain, blood in stool, heartburn, melena and nausea.       Hematemesis  Genitourinary: Negative for hematuria.  Musculoskeletal: Negative for falls and myalgias.  Skin: Negative for rash.  Neurological: Positive for sensory change and weakness. Negative for  dizziness, tingling, tremors, speech change, focal weakness and loss of consciousness.  Endo/Heme/Allergies: Does not bruise/bleed easily.  Psychiatric/Behavioral: Negative for substance abuse. The patient is not nervous/anxious.   All other systems reviewed and are negative.    EKGs/Labs/Other Studies Reviewed:    Studies reviewed were summarized above. The additional studies were reviewed today:  Lexiscan MPI 10/08/2020: Pharmacological myocardial perfusion imaging study with no significant ischemia Large perfusion defect inferior wall, mildly better with stress than rest,  images concerning for diaphragmatic attenuation artifact though unable to exclude old inferior MI  CT attenuation correction images not available secondary to patient movement GI uptake artifact noted.  Hypokinesis inferoseptal wall, EF estimated at 38% (estimation of ejection fraction likely erroneous in the setting of GI uptake artifact) No EKG changes concerning for ischemia at peak stress or in recovery. Resting EKG with diffuse T wave abnormality, rare PVC Prior CT scan dated August 2021 with coronary calcification most notably in the LAD Moderate risk scan given depressed ejection fraction __________  2D echo 09/26/2020: 1. Left ventricular ejection fraction, by estimation, is 35 to 40%. The  left ventricle has moderately decreased function. The left ventricle  demonstrates global hypokinesis. The left  ventricular internal cavity size  was mildly dilated. Left ventricular  diastolic parameters were normal.  2. Right ventricular systolic function is normal. The right ventricular  size is normal.  3. The mitral valve is normal in structure. Mild mitral valve  regurgitation.  4. The aortic valve is normal in structure. Aortic valve regurgitation is  not visualized. __________  2D echo 04/27/2019: 1. Left ventricular ejection fraction, by visual estimation, is 45 to  50%. The left ventricle has mildly decreased function. There is mildly  increased left ventricular hypertrophy.  2. Left ventricular diastolic parameters are indeterminate.  3. Global right ventricle has normal systolic function.The right  ventricular size is normal. No increase in right ventricular wall  thickness.  4. Left atrial size was moderately dilated.  5. Right atrial size was normal.  6. The mitral valve is normal in structure. No evidence of mitral valve  regurgitation. No evidence of mitral stenosis.  7. The tricuspid valve is normal in structure. Tricuspid valve  regurgitation is not demonstrated.  8. The aortic valve is normal in structure. Aortic valve regurgitation is  not visualized. Mild to moderate aortic valve sclerosis/calcification  without any evidence of aortic stenosis.  9. The pulmonic valve was normal in structure. Pulmonic valve  regurgitation is not visualized.  10. TR signal is inadequate for assessing pulmonary artery systolic  pressure.  11. The inferior vena cava is normal in size with greater than 50%  respiratory variability, suggesting right atrial pressure of 3 mmHg. __________  RHC 03/29/2019: Pulmonary capillary wedge pressure mean of 8 PA: 32/16 mean 23 RA mean of 10 RV 26/12/13   Cardiac output 8.17 Cardiac index 3.42  Final Conclusions:   Normal right heart pressures  Recommendations:  Etiology of his shortness of breath likely multifactorial He is at  euvolemic state at this time, no aggressive diuresis needed Needs referral to pulmonary for COPD work-up, long smoking history He may benefit from lung Works, exercise as needed sedentary lifestyle and is deconditioned -Recommended weight loss, lifestyle modification given obesity contributing to his shortness of breath symptoms __________  2D echo 03/03/2019: 1. Left ventricular ejection fraction, by visual estimation, is 55 to  60%. The left ventricle has normal function. Normal left ventricular size.  Left ventricular septal wall thickness was  mildly increased. Mildly  increased left ventricular posterior  wall thickness. There is mildly increased left ventricular hypertrophy.  2. Elevated left ventricular end-diastolic pressure.  3. Left ventricular diastolic Doppler parameters are consistent with  pseudonormalization pattern of LV diastolic filling.  4. Global right ventricle has normal systolic function.The right  ventricular size is normal. No increase in right ventricular wall  thickness.  5. Left atrial size was severely dilated.  6. Right atrial size was normal.  7. The mitral valve is normal in structure. Trace mitral valve  regurgitation. No evidence of mitral stenosis.  8. The tricuspid valve is normal in structure. Tricuspid valve  regurgitation is trivial.  9. The aortic valve is normal in structure. Aortic valve regurgitation  was not visualized by color flow Doppler. Mild aortic valve sclerosis  without stenosis.  10. The pulmonic valve was normal in structure. Pulmonic valve  regurgitation is trivial by color flow Doppler.  11. Moderately elevated pulmonary artery systolic pressure.  12. The inferior vena cava is dilated in size with >50% respiratory  variability, suggesting right atrial pressure of 8 mmHg.    EKG:  EKG is ordered today.  The EKG ordered today demonstrates NSR, 84 bpm, nonspecific lateral ST-T changes  Recent Labs: 10/06/2020: B  Natriuretic Peptide 81.7; Magnesium 1.9; TSH 1.561 10/15/2020: ALT 21; BUN 51; BUN 49; Creatinine, Ser 2.55; Creatinine, Ser 2.48; Hemoglobin 10.7; Platelets 176; Potassium 4.7; Potassium 4.6; Sodium 142; Sodium 140  Recent Lipid Panel    Component Value Date/Time   CHOL 131 10/07/2020 0543   CHOL 151 02/27/2018 0844   CHOL 137 12/07/2012 0518   TRIG 142 10/07/2020 0543   TRIG 98 12/07/2012 0518   HDL 40 (L) 10/07/2020 0543   HDL 36 (L) 02/27/2018 0844   HDL 45 12/07/2012 0518   CHOLHDL 3.3 10/07/2020 0543   VLDL 28 10/07/2020 0543   VLDL 20 12/07/2012 0518   LDLCALC 63 10/07/2020 0543   LDLCALC 94 02/27/2018 0844   LDLCALC 72 12/07/2012 0518    PHYSICAL EXAM:    VS:  BP 140/80 (BP Location: Left Arm, Patient Position: Sitting, Cuff Size: Large)   Pulse 84   Ht _0  (1.803 m)   Wt 244 lb 6 oz (110.8 kg)   SpO2 98%   BMI 34.08 kg/m   BMI: Body mass index is 34.08 kg/m.  Physical Exam Constitutional:      Appearance: He is well-developed.  HENT:     Head: Normocephalic and atraumatic.  Eyes:     General:        Right eye: No discharge.        Left eye: No discharge.  Neck:     Vascular: No JVD.  Cardiovascular:     Rate and Rhythm: Normal rate and regular rhythm.     Pulses: No midsystolic click and no opening snap.          Posterior tibial pulses are 2+ on the right side and 2+ on the left side.     Heart sounds: Normal heart sounds, S1 normal and S2 normal. Heart sounds not distant. No murmur heard. No friction rub.  Pulmonary:     Effort: Pulmonary effort is normal. No respiratory distress.     Breath sounds: Decreased breath sounds present. No wheezing or rales.     Comments: Pilar Plate blood noted on tissue in the exam room Diminished breath sounds bilaterally versus poor inspiratory effort Chest:     Chest wall: No tenderness.  Abdominal:     General: There is no distension.     Palpations: Abdomen is soft.     Tenderness: There is no abdominal tenderness.   Musculoskeletal:     Cervical back: Normal range of motion.  Skin:    General: Skin is warm and dry.     Nails: There is no clubbing.  Neurological:     Mental Status: He is alert and oriented to person, place, and time.  Psychiatric:        Speech: Speech normal.        Behavior: Behavior normal.        Thought Content: Thought content normal.        Judgment: Judgment normal.     Wt Readings from Last 3 Encounters:  10/30/20 244 lb 6 oz (110.8 kg)  10/15/20 260 lb (117.9 kg)  10/07/20 275 lb 12.8 oz (125.1 kg)     ASSESSMENT & PLAN:   1. Hemoptysis versus hematemesis with associated chest pain: He reports multiple episodes of hemoptysis/hematemesis over the past 24 hours and indicates he woke up this morning with a "large amount" of blood on his bed sheets.  At this time, it is uncertain where his bleeding is originating from.  Recommend transfer to the ED for further evaluation and management.  Patient is currently only on aspirin 81 mg daily.  2. HFrEF secondary to NICM: Volume status is difficult to assess on physical exam given body habitus.  Further evaluation of his cardiomyopathy was deferred today given problem focused visit for #1 as outlined above.  He remains on carvedilol, Imdur/hydralazine, and furosemide.  Not currently on ACE inhibitor/ARB/ARNI/MRA/SGLT2I in the setting of underlying CKD.  3. Nonobstructive CAD involving the native coronary arteries/HLD: Currently he is on aspirin which may need to be held pending work-up in the ED continue rosuvastatin.  4. HTN: Blood pressure is mildly elevated in the office today.  Medication changes were deferred given patient transfer to the ED.  5. CKD stage III: Followed by nephrology.  6. Normocytic anemia: Transfer to the ED as above.  7. Morbid obesity with OSA: Weight loss with compliance with CPAP recommended.  This was not discussed today given #1.   Disposition: F/u with Dr. Rockey Situ or an APP in 1  month.   Medication Adjustments/Labs and Tests Ordered: Current medicines are reviewed at length with the patient today.  Concerns regarding medicines are outlined above. Medication changes, Labs and Tests ordered today are summarized above and listed in the Patient Instructions accessible in Encounters.   Signed, Christell Faith, PA-C 10/30/2020 10:29 AM     [facility] [address] [place], [place] [address] [phone]

## 2020-10-30 ENCOUNTER — Emergency Department: Payer: Medicare PPO

## 2020-10-30 ENCOUNTER — Observation Stay
Admission: EM | Admit: 2020-10-30 | Discharge: 2020-10-31 | Disposition: A | Discharge status: Home / Self Care | Payer: Medicare PPO | Attending: Internal Medicine | Admitting: Internal Medicine

## 2020-10-30 ENCOUNTER — Other Ambulatory Visit: Payer: Self-pay

## 2020-10-30 ENCOUNTER — Ambulatory Visit (INDEPENDENT_AMBULATORY_CARE_PROVIDER_SITE_OTHER): Payer: Medicare PPO | Admitting: Physician Assistant

## 2020-10-30 ENCOUNTER — Encounter: Payer: Self-pay | Admitting: Emergency Medicine

## 2020-10-30 ENCOUNTER — Encounter: Payer: Self-pay | Admitting: Physician Assistant

## 2020-10-30 VITALS — BP 140/80 | HR 84 | Ht 71.0 in | Wt 244.4 lb

## 2020-10-30 DIAGNOSIS — Z794 Long term (current) use of insulin: Secondary | ICD-10-CM | POA: Diagnosis not present

## 2020-10-30 DIAGNOSIS — I708 Atherosclerosis of other arteries: Secondary | ICD-10-CM | POA: Diagnosis not present

## 2020-10-30 DIAGNOSIS — R042 Hemoptysis: Secondary | ICD-10-CM | POA: Diagnosis not present

## 2020-10-30 DIAGNOSIS — I10 Essential (primary) hypertension: Secondary | ICD-10-CM

## 2020-10-30 DIAGNOSIS — I2510 Atherosclerotic heart disease of native coronary artery without angina pectoris: Secondary | ICD-10-CM

## 2020-10-30 DIAGNOSIS — I5042 Chronic combined systolic (congestive) and diastolic (congestive) heart failure: Secondary | ICD-10-CM | POA: Diagnosis not present

## 2020-10-30 DIAGNOSIS — K920 Hematemesis: Secondary | ICD-10-CM | POA: Diagnosis not present

## 2020-10-30 DIAGNOSIS — R0789 Other chest pain: Principal | ICD-10-CM | POA: Insufficient documentation

## 2020-10-30 DIAGNOSIS — I428 Other cardiomyopathies: Secondary | ICD-10-CM | POA: Diagnosis not present

## 2020-10-30 DIAGNOSIS — Z7984 Long term (current) use of oral hypoglycemic drugs: Secondary | ICD-10-CM | POA: Diagnosis not present

## 2020-10-30 DIAGNOSIS — D649 Anemia, unspecified: Secondary | ICD-10-CM | POA: Diagnosis not present

## 2020-10-30 DIAGNOSIS — Z79899 Other long term (current) drug therapy: Secondary | ICD-10-CM | POA: Diagnosis not present

## 2020-10-30 DIAGNOSIS — E1122 Type 2 diabetes mellitus with diabetic chronic kidney disease: Secondary | ICD-10-CM | POA: Insufficient documentation

## 2020-10-30 DIAGNOSIS — R079 Chest pain, unspecified: Secondary | ICD-10-CM | POA: Diagnosis present

## 2020-10-30 DIAGNOSIS — J441 Chronic obstructive pulmonary disease with (acute) exacerbation: Secondary | ICD-10-CM | POA: Diagnosis not present

## 2020-10-30 DIAGNOSIS — N183 Chronic kidney disease, stage 3 unspecified: Secondary | ICD-10-CM | POA: Diagnosis not present

## 2020-10-30 DIAGNOSIS — N184 Chronic kidney disease, stage 4 (severe): Secondary | ICD-10-CM | POA: Diagnosis present

## 2020-10-30 DIAGNOSIS — I5020 Unspecified systolic (congestive) heart failure: Secondary | ICD-10-CM | POA: Diagnosis not present

## 2020-10-30 DIAGNOSIS — K429 Umbilical hernia without obstruction or gangrene: Secondary | ICD-10-CM | POA: Diagnosis not present

## 2020-10-30 DIAGNOSIS — Z20822 Contact with and (suspected) exposure to covid-19: Secondary | ICD-10-CM | POA: Diagnosis not present

## 2020-10-30 DIAGNOSIS — R7989 Other specified abnormal findings of blood chemistry: Secondary | ICD-10-CM | POA: Diagnosis not present

## 2020-10-30 DIAGNOSIS — J09X2 Influenza due to identified novel influenza A virus with other respiratory manifestations: Secondary | ICD-10-CM | POA: Insufficient documentation

## 2020-10-30 DIAGNOSIS — I248 Other forms of acute ischemic heart disease: Secondary | ICD-10-CM

## 2020-10-30 DIAGNOSIS — I130 Hypertensive heart and chronic kidney disease with heart failure and stage 1 through stage 4 chronic kidney disease, or unspecified chronic kidney disease: Secondary | ICD-10-CM | POA: Insufficient documentation

## 2020-10-30 DIAGNOSIS — R059 Cough, unspecified: Secondary | ICD-10-CM | POA: Diagnosis not present

## 2020-10-30 DIAGNOSIS — Z87891 Personal history of nicotine dependence: Secondary | ICD-10-CM | POA: Diagnosis not present

## 2020-10-30 DIAGNOSIS — Z7982 Long term (current) use of aspirin: Secondary | ICD-10-CM | POA: Diagnosis not present

## 2020-10-30 DIAGNOSIS — R0602 Shortness of breath: Secondary | ICD-10-CM | POA: Diagnosis not present

## 2020-10-30 DIAGNOSIS — N1832 Chronic kidney disease, stage 3b: Secondary | ICD-10-CM | POA: Insufficient documentation

## 2020-10-30 DIAGNOSIS — J449 Chronic obstructive pulmonary disease, unspecified: Secondary | ICD-10-CM | POA: Diagnosis not present

## 2020-10-30 DIAGNOSIS — J101 Influenza due to other identified influenza virus with other respiratory manifestations: Secondary | ICD-10-CM | POA: Diagnosis present

## 2020-10-30 DIAGNOSIS — I5022 Chronic systolic (congestive) heart failure: Secondary | ICD-10-CM | POA: Diagnosis not present

## 2020-10-30 DIAGNOSIS — I2489 Other forms of acute ischemic heart disease: Secondary | ICD-10-CM

## 2020-10-30 DIAGNOSIS — J111 Influenza due to unidentified influenza virus with other respiratory manifestations: Secondary | ICD-10-CM | POA: Diagnosis not present

## 2020-10-30 DIAGNOSIS — E785 Hyperlipidemia, unspecified: Secondary | ICD-10-CM

## 2020-10-30 DIAGNOSIS — K5730 Diverticulosis of large intestine without perforation or abscess without bleeding: Secondary | ICD-10-CM | POA: Diagnosis not present

## 2020-10-30 DIAGNOSIS — R778 Other specified abnormalities of plasma proteins: Secondary | ICD-10-CM | POA: Diagnosis present

## 2020-10-30 DIAGNOSIS — E669 Obesity, unspecified: Secondary | ICD-10-CM | POA: Diagnosis present

## 2020-10-30 LAB — TYPE AND SCREEN
ABO/RH(D): A POS
Antibody Screen: NEGATIVE

## 2020-10-30 LAB — TROPONIN I (HIGH SENSITIVITY)
Troponin I (High Sensitivity): 76 ng/L — ABNORMAL HIGH (ref <18)
Troponin I (High Sensitivity): 103 ng/L (ref <18)
Troponin I (High Sensitivity): 98 ng/L — ABNORMAL HIGH (ref <18)
Troponin I (High Sensitivity): 92 ng/L — ABNORMAL HIGH (ref <18)

## 2020-10-30 LAB — CBC
HCT: 33 % — ABNORMAL LOW (ref 39.0–52.0)
Hemoglobin: 10.9 g/dL — ABNORMAL LOW (ref 13.0–17.0)
MCH: 28.8 pg (ref 26.0–34.0)
MCHC: 33 g/dL (ref 30.0–36.0)
MCV: 87.1 fL (ref 80.0–100.0)
Platelets: 197 10*3/uL (ref 150–400)
RBC: 3.79 MIL/uL — ABNORMAL LOW (ref 4.22–5.81)
RDW: 14 % (ref 11.5–15.5)
WBC: 4.5 10*3/uL (ref 4.0–10.5)
nRBC: 0 % (ref 0.0–0.2)

## 2020-10-30 LAB — BASIC METABOLIC PANEL
Anion gap: 9 (ref 5–15)
BUN: 36 mg/dL — ABNORMAL HIGH (ref 8–23)
CO2: 17 mmol/L — ABNORMAL LOW (ref 22–32)
Calcium: 8.3 mg/dL — ABNORMAL LOW (ref 8.9–10.3)
Chloride: 106 mmol/L (ref 98–111)
Creatinine, Ser: 2.47 mg/dL — ABNORMAL HIGH (ref 0.61–1.24)
GFR, Estimated: 28 mL/min — ABNORMAL LOW (ref >60)
Glucose, Bld: 114 mg/dL — ABNORMAL HIGH (ref 70–99)
Potassium: 4.3 mmol/L (ref 3.5–5.1)
Sodium: 132 mmol/L — ABNORMAL LOW (ref 135–145)

## 2020-10-30 LAB — RESP PANEL BY RT-PCR (FLU A&B, COVID) ARPGX2
Influenza A by PCR: POSITIVE — AB
Influenza B by PCR: NEGATIVE
SARS Coronavirus 2 by RT PCR: NEGATIVE

## 2020-10-30 LAB — GLUCOSE, CAPILLARY: Glucose-Capillary: 173 mg/dL — ABNORMAL HIGH (ref 70–99)

## 2020-10-30 MED ORDER — TIOTROPIUM BROMIDE MONOHYDRATE 18 MCG IN CAPS
18.0000 ug | ORAL_CAPSULE | Freq: Every day | RESPIRATORY_TRACT | Status: DC
  Administered 2020-10-31: 18 ug via RESPIRATORY_TRACT
  Filled 2020-10-30: qty 5

## 2020-10-30 MED ORDER — FERROUS GLUCONATE 324 (38 FE) MG PO TABS
324.0000 mg | ORAL_TABLET | Freq: Every day | ORAL | Status: DC
  Administered 2020-10-31: 324 mg via ORAL
  Filled 2020-10-30 (×2): qty 1

## 2020-10-30 MED ORDER — POTASSIUM CHLORIDE CRYS ER 20 MEQ PO TBCR
20.0000 meq | EXTENDED_RELEASE_TABLET | Freq: Two times a day (BID) | ORAL | Status: DC
Start: 2020-10-30 — End: 2020-10-31
  Administered 2020-10-30 – 2020-10-31 (×2): 20 meq via ORAL
  Filled 2020-10-30 (×2): qty 1

## 2020-10-30 MED ORDER — LORATADINE 10 MG PO TABS
10.0000 mg | ORAL_TABLET | Freq: Every day | ORAL | Status: DC
  Administered 2020-10-31: 10 mg via ORAL
  Filled 2020-10-30 (×2): qty 1

## 2020-10-30 MED ORDER — PANTOPRAZOLE SODIUM 40 MG PO TBEC
40.0000 mg | DELAYED_RELEASE_TABLET | Freq: Every day | ORAL | Status: DC
  Administered 2020-10-31: 40 mg via ORAL
  Filled 2020-10-30: qty 1

## 2020-10-30 MED ORDER — ENOXAPARIN SODIUM 40 MG/0.4ML IJ SOSY: 40.0000 mg | PREFILLED_SYRINGE | INTRAMUSCULAR | Status: DC

## 2020-10-30 MED ORDER — FUROSEMIDE 40 MG PO TABS
40.0000 mg | ORAL_TABLET | Freq: Every day | ORAL | Status: DC
  Administered 2020-10-31: 40 mg via ORAL
  Filled 2020-10-30: qty 1

## 2020-10-30 MED ORDER — ESCITALOPRAM OXALATE 10 MG PO TABS
10.0000 mg | ORAL_TABLET | Freq: Every day | ORAL | Status: DC
  Administered 2020-10-31: 10 mg via ORAL
  Filled 2020-10-30: qty 1

## 2020-10-30 MED ORDER — INSULIN ASPART 100 UNIT/ML IJ SOLN
0.0000 [IU] | Freq: Three times a day (TID) | INTRAMUSCULAR | Status: DC
  Administered 2020-10-31 (×2): 5 [IU] via SUBCUTANEOUS
  Filled 2020-10-30 (×2): qty 1

## 2020-10-30 MED ORDER — ENOXAPARIN SODIUM 60 MG/0.6ML IJ SOSY
0.5000 mg/kg | PREFILLED_SYRINGE | INTRAMUSCULAR | Status: DC
  Administered 2020-10-30: 55 mg via SUBCUTANEOUS
  Filled 2020-10-30 (×2): qty 0.55

## 2020-10-30 MED ORDER — SODIUM CHLORIDE 0.9 % IV SOLN: 250.0000 mL | INTRAVENOUS | Status: DC | PRN

## 2020-10-30 MED ORDER — ONDANSETRON HCL 4 MG/2ML IJ SOLN: 4.0000 mg | Freq: Four times a day (QID) | INTRAMUSCULAR | Status: DC | PRN

## 2020-10-30 MED ORDER — MOMETASONE FURO-FORMOTEROL FUM 200-5 MCG/ACT IN AERO
2.0000 | INHALATION_SPRAY | Freq: Two times a day (BID) | RESPIRATORY_TRACT | Status: DC
  Administered 2020-10-30 – 2020-10-31 (×2): 2 via RESPIRATORY_TRACT
  Filled 2020-10-30: qty 8.8

## 2020-10-30 MED ORDER — METHYLPREDNISOLONE SODIUM SUCC 40 MG IJ SOLR
40.0000 mg | Freq: Once | INTRAMUSCULAR | Status: AC
  Administered 2020-10-30: 40 mg via INTRAVENOUS
  Filled 2020-10-30: qty 1

## 2020-10-30 MED ORDER — SODIUM CHLORIDE 0.9% FLUSH: 3.0000 mL | INTRAVENOUS | Status: DC | PRN

## 2020-10-30 MED ORDER — ASPIRIN EC 81 MG PO TBEC
81.0000 mg | DELAYED_RELEASE_TABLET | Freq: Every day | ORAL | Status: DC
  Administered 2020-10-31: 81 mg via ORAL
  Filled 2020-10-30 (×2): qty 1

## 2020-10-30 MED ORDER — HYDRALAZINE HCL 50 MG PO TABS
100.0000 mg | ORAL_TABLET | Freq: Three times a day (TID) | ORAL | Status: DC
  Administered 2020-10-30 – 2020-10-31 (×3): 100 mg via ORAL
  Filled 2020-10-30 (×3): qty 2

## 2020-10-30 MED ORDER — ISOSORBIDE MONONITRATE ER 30 MG PO TB24
30.0000 mg | ORAL_TABLET | Freq: Two times a day (BID) | ORAL | Status: DC
  Administered 2020-10-30 – 2020-10-31 (×2): 30 mg via ORAL
  Filled 2020-10-30 (×2): qty 1

## 2020-10-30 MED ORDER — ACETAMINOPHEN 325 MG PO TABS: 650.0000 mg | ORAL_TABLET | Freq: Four times a day (QID) | ORAL | Status: DC | PRN

## 2020-10-30 MED ORDER — OSELTAMIVIR PHOSPHATE 75 MG PO CAPS
75.0000 mg | ORAL_CAPSULE | Freq: Two times a day (BID) | ORAL | Status: DC
  Administered 2020-10-30: 75 mg via ORAL
  Filled 2020-10-30 (×2): qty 1

## 2020-10-30 MED ORDER — IPRATROPIUM-ALBUTEROL 0.5-2.5 (3) MG/3ML IN SOLN
3.0000 mL | Freq: Once | RESPIRATORY_TRACT | Status: AC
  Administered 2020-10-30: 3 mL via RESPIRATORY_TRACT
  Filled 2020-10-30: qty 3

## 2020-10-30 MED ORDER — ALBUTEROL SULFATE HFA 108 (90 BASE) MCG/ACT IN AERS
2.0000 | INHALATION_SPRAY | Freq: Four times a day (QID) | RESPIRATORY_TRACT | Status: DC | PRN
  Filled 2020-10-30: qty 6.7

## 2020-10-30 MED ORDER — GABAPENTIN 100 MG PO CAPS
100.0000 mg | ORAL_CAPSULE | Freq: Three times a day (TID) | ORAL | Status: DC
  Administered 2020-10-30 – 2020-10-31 (×2): 100 mg via ORAL
  Filled 2020-10-30 (×2): qty 1

## 2020-10-30 MED ORDER — SODIUM CHLORIDE 0.9% FLUSH
3.0000 mL | Freq: Two times a day (BID) | INTRAVENOUS | Status: DC
  Administered 2020-10-30 – 2020-10-31 (×2): 3 mL via INTRAVENOUS

## 2020-10-30 MED ORDER — ONDANSETRON HCL 4 MG PO TABS: 4.0000 mg | ORAL_TABLET | Freq: Four times a day (QID) | ORAL | Status: DC | PRN

## 2020-10-30 MED ORDER — ACETAMINOPHEN 650 MG RE SUPP: 650.0000 mg | Freq: Four times a day (QID) | RECTAL | Status: DC | PRN

## 2020-10-30 MED ORDER — CARVEDILOL 6.25 MG PO TABS
6.2500 mg | ORAL_TABLET | Freq: Two times a day (BID) | ORAL | Status: DC
  Administered 2020-10-30 – 2020-10-31 (×2): 6.25 mg via ORAL
  Filled 2020-10-30 (×2): qty 1

## 2020-10-30 MED ORDER — MUPIROCIN 2 % EX OINT
1.0000 "application " | TOPICAL_OINTMENT | Freq: Every day | CUTANEOUS | Status: DC
  Administered 2020-10-31: 1 via TOPICAL
  Filled 2020-10-30: qty 22

## 2020-10-30 NOTE — ED Provider Notes (Signed)
[facility] Emergency Department Provider Note   ____________________________________________   Event Date/Time   First MD Initiated Contact with Patient 10/30/20 1251     (approximate)  I have reviewed the triage vital signs and the nursing notes.   HISTORY  Chief Complaint Chest Pain    HPI Larry Callahan is a 65 y.o. male with extensive past medical history listed below who presents complaining of "vomiting blood" all day yesterday, having watery emesis, and drops of blood from the nose intermittently as well as with coughing.  Patient also endorses substernal chest pressure that is been worsening over the last 2 days as well patient also endorses associated cough and shortness of breath.  Patient denies any exacerbating or relieving factors for the symptoms.  Patient denies any blood thinner use.  Patient currently denies any vision changes, tinnitus, difficulty speaking, facial droop, sore throat, abdominal pain, diarrhea, dysuria, or weakness/numbness/paresthesias in any extremity         Past Medical History:  Diagnosis Date  . Allergy   . Coronary artery disease   . Diabetes mellitus without complication ([place])   . Diastolic CHF ([place])   . GERD (gastroesophageal reflux disease)   . Hyperlipidemia   . Hypertension   . Renal insufficiency   . Sleep apnea     Patient Active Problem List   Diagnosis Date Noted  . Chest pain 10/06/2020  . HLD (hyperlipidemia) 10/06/2020  . CVA (cerebral vascular accident) ([place]) 09/25/2020  . Gastroesophageal reflux disease without esophagitis 04/19/2020  . Pneumonia due to COVID-19 virus 01/30/2020  . Elevated troponin 01/29/2020  . Suspected COVID-19 virus infection 01/29/2020  . Postural dizziness with presyncope 01/29/2020  . Hematemesis 01/09/2020  . Hospital discharge follow-up 11/26/2019  . Cellulitis of left hand 09/26/2019  . Generalized weakness 08/08/2019  . Obesity (BMI 30-39.9)  08/08/2019  . Acute right hip pain 06/17/2019  . Inability to ambulate due to hip 06/17/2019  . CKD stage 3 due to type 2 diabetes mellitus ([place])   . Hypervolemia   . Acute on chronic diastolic CHF (congestive heart failure) ([place])   . Type 2 diabetes mellitus with stage 3b chronic kidney disease, with long-term current use of insulin ([place])   . Hypomagnesemia   . Full code status 05/20/2019  . Pleuritic chest pain   . Multifocal pneumonia 05/19/2019  . Acute kidney injury superimposed on CKD 3b ([place]) 05/09/2019  . Hemoptysis 04/09/2019  . Shortness of breath 03/26/2019  . Uncontrolled type 2 diabetes mellitus with insulin therapy ([place]) 03/12/2019  . Acute on chronic heart failure with preserved ejection fraction (HFpEF) ([place])   . Chronic kidney disease, stage 3b ([place])   . Chronic systolic CHF (congestive heart failure) ([place]) 03/01/2019  . Diastolic dysfunction 16/03/9603  . Iron deficiency anemia 12/31/2018  . Atopic dermatitis 11/24/2018  . Screening for colon cancer 11/06/2017  . Uncontrolled type 2 diabetes mellitus with hyperglycemia ([place]) 11/06/2017  . Hidradenitis 06/09/2017  . Atherosclerotic heart disease of native coronary artery without angina pectoris 06/09/2017  . 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 Obstructive sleep apnea, adult 06/09/2017  . Morbid (severe) obesity due to excess calories ([place]) 06/09/2017  . Cigarette nicotine dependence with nicotine-induced disorder 06/09/2017  . Diabetic polyneuropathy associated with type 2 diabetes mellitus ([place]) 06/09/2017  . Allergic rhinitis due to pollen 06/09/2017  . Mixed hyperlipidemia 06/09/2017  . COPD (chronic obstructive pulmonary disease) ([place]) 06/09/2017  . Dyspnea on exertion  06/09/2017  . Wheezing 06/09/2017  . Dysuria 06/09/2017  . Snoring 06/09/2017  . Essential hypertension 06/09/2017  . Personal history of other malignant neoplasm of kidney 06/09/2017  . Pain in right hip  06/09/2017  . Impacted cerumen, bilateral 06/09/2017  . Secondary hyperparathyroidism, not elsewhere classified ([place]) 06/09/2017  . Type 2 diabetes mellitus with hyperglycemia ([place]) 06/09/2017  . Tinea corporis 06/09/2017    Past Surgical History:  Procedure Laterality Date  . BACK SURGERY    . CARDIAC CATHETERIZATION    . CHOLECYSTECTOMY    . COLONOSCOPY WITH PROPOFOL N/A 04/12/2019   Procedure: COLONOSCOPY WITH PROPOFOL;  Surgeon: [name], MD;  Location: [facility] ENDOSCOPY;  Service: Gastroenterology;  Laterality: N/A;  . ESOPHAGOGASTRODUODENOSCOPY N/A 04/12/2019   Procedure: ESOPHAGOGASTRODUODENOSCOPY (EGD);  Surgeon: [name], MD;  Location: [facility] ENDOSCOPY;  Service: Gastroenterology;  Laterality: N/A;  . ESOPHAGOGASTRODUODENOSCOPY (EGD) WITH PROPOFOL N/A 11/21/2019   Procedure: ESOPHAGOGASTRODUODENOSCOPY (EGD) WITH PROPOFOL;  Surgeon: [name], MD;  Location: [facility] ENDOSCOPY;  Service: Gastroenterology;  Laterality: N/A;  . FLEXIBLE BRONCHOSCOPY Bilateral 05/17/2019   Procedure: FLEXIBLE BRONCHOSCOPY;  Surgeon: [name], MD;  Location: [name] ORS;  Service: Pulmonary;  Laterality: Bilateral;  . left arm surgery    . nephrectomy Left   . PARATHYROIDECTOMY    . RIGHT HEART CATH N/A 03/29/2019   Procedure: RIGHT HEART CATH;  Surgeon: [name], MD;  Location: [facility] CV LAB;  Service: Cardiovascular;  Laterality: N/A;    Prior to Admission medications   Medication Sig Start Date End Date Taking? Authorizing Provider  albuterol (VENTOLIN HFA) 108 (90 Base) MCG/ACT inhaler Inhale 2 puffs into the lungs every 6 (six) hours as needed for wheezing or shortness of breath. 08/31/20   Lavera Guise, MD  aspirin EC 81 MG tablet Take 1 tablet (81 mg total) by mouth daily. Swallow whole. 09/26/20 09/26/21  Nita Sells, MD  Blood Glucose Calibration (TRUE METRIX LEVEL 1) Low SOLN Use as directed 04/04/18   [name], NP  Blood Glucose  Monitoring Suppl (TRUE METRIX AIR GLUCOSE METER) DEVI 1 Device by Does not apply route 2 (two) times daily. 04/13/18   [name], NP  budesonide-formoterol (SYMBICORT) 160-4.5 MCG/ACT inhaler Inhale 2 puffs into the lungs 2 (two) times daily. 07/20/20   Lavera Guise, MD  carvedilol (COREG) 6.25 MG tablet Take 1 tablet (6.25 mg total) by mouth 2 (two) times daily with a meal. 10/08/20   Jennye Boroughs, MD  escitalopram (LEXAPRO) 10 MG tablet Take one tab po qd for anxiety with food 07/17/20   Lavera Guise, MD  ferrous gluconate (FERGON) 324 MG tablet Take 1 tablet (324 mg total) by mouth daily with breakfast. 08/01/19   [name], NP  furosemide (LASIX) 40 MG tablet Take one to tabs once or twice days for fluid // swelling 07/17/20   Lavera Guise, MD  gabapentin (NEURONTIN) 100 MG capsule Take 1 capsule (100 mg total) by mouth in the morning, at noon, and at bedtime. 07/17/20   Lavera Guise, MD  glimepiride Jari Sportsman) 2 MG tablet Take one tab po qd with supper 07/17/20   Lavera Guise, MD  glucose blood (TRUE METRIX BLOOD GLUCOSE TEST) test strip Use as instructed twice daily diag E11.65 07/30/19   [name], NP  hydrALAZINE (APRESOLINE) 100 MG tablet Take 1 tablet (100 mg total) by mouth 3 (three) times daily. 07/17/20   Lavera Guise, MD  insulin glargine, 1 Unit Dial, (TOUJEO) 300 UNIT/ML Solostar Pen Inject 26 Units into the skin daily. 04/30/20   [name], NP  ipratropium-albuterol (DUONEB) 0.5-2.5 (3) MG/3ML SOLN Take 3 mLs by nebulization every 6 (six) hours as needed. 10/11/20   Lavera Guise, MD  isosorbide mononitrate (IMDUR) 30 MG 24 hr tablet Take 1 tablet (30 mg total) by mouth 2 (two) times daily. 10/08/20   Jennye Boroughs, MD  mupirocin ointment (BACTROBAN) 2 % Apply 1 application topically daily. Qd to excision site 10/06/20   Ralene Bathe, MD  NOVOFINE PEN NEEDLE 32G X 6 MM MISC Use as directed 10/15/20   Lavera Guise, MD  omeprazole (PRILOSEC) 40 MG capsule Take 1  capsule (40 mg total) by mouth in the morning and at bedtime. 07/17/20   Lavera Guise, MD  potassium chloride SA (KLOR-CON) 20 MEQ tablet Take 1 tablet (20 mEq total) by mouth 2 (two) times daily. 07/17/20   Lavera Guise, MD  tiotropium (SPIRIVA HANDIHALER) 18 MCG inhalation capsule Place 1 capsule (18 mcg total) into inhaler and inhale daily. 06/02/20 08/31/20  [name], NP  TRUEplus Lancets 33G MISC Use as directed twice daily diag E11.65 07/30/19   [name], NP    Allergies Penicillins  Family History  Problem Relation Age of Onset  . Diabetes Mother        2003  . Lung cancer Father   . Diabetes Sister   . Hypertension Sister   . Heart disease Sister   . Cancer Sister   . Bone cancer Brother     Social History Social History   Tobacco Use  . Smoking status: Former Smoker    Types: Cigarettes  . Smokeless tobacco: Former Systems developer    Types: Secondary school teacher  . Vaping Use: Never used  Substance Use Topics  . Alcohol use: No  . Drug use: No    Review of Systems Constitutional: No fever/chills Eyes: No visual changes. ENT: No sore throat. Cardiovascular: Endorses chest pain. Respiratory: Endorses shortness of breath. Gastrointestinal: No abdominal pain.  Endorses nausea and hematemesis.  No diarrhea. Genitourinary: Negative for dysuria. Musculoskeletal: Negative for acute arthralgias Skin: Negative for rash. Neurological: Negative for headaches, weakness/numbness/paresthesias in any extremity Psychiatric: Negative for suicidal ideation/homicidal ideation   ____________________________________________   PHYSICAL EXAM:  VITAL SIGNS: ED Triage Vitals  Enc Vitals Group     BP 10/30/20 1043 133/78     Pulse Rate 10/30/20 1043 84     Resp 10/30/20 1043 (!) 24     Temp 10/30/20 1043 100.1 F (37.8 C)     Temp Source 10/30/20 1043 Oral     SpO2 10/30/20 1043 94 %     Weight 10/30/20 1042 244 lb 6 oz (110.8 kg)     Height 10/30/20 1042 _0  (1.803  m)     Head Circumference --      Peak Flow --      Pain Score 10/30/20 1042 9     Pain Loc --      Pain Edu? --      Excl. in [place]? --    Constitutional: Alert and oriented. Well appearing morbidly obese elderly Caucasian male in no acute distress. Eyes: Conjunctivae are normal. PERRL. Head: Atraumatic. Nose: No congestion/rhinnorhea.  No dried blood in nares Mouth/Throat: Mucous membranes are moist.  No blood in posterior oropharynx Neck: No stridor Cardiovascular: Grossly normal heart sounds.  Good peripheral circulation.  Respiratory: Inspiratory and expiratory wheezes over bilateral lung fields normal respiratory effort.  No retractions. Gastrointestinal: Soft and nontender. No distention. Musculoskeletal: No obvious deformities Neurologic:  Normal speech and language. No gross focal neurologic deficits are appreciated. Skin:  Skin is warm and dry. No rash noted. Psychiatric: Mood and affect are normal. Speech and behavior are normal.  ____________________________________________   LABS (all labs ordered are listed, but only abnormal results are displayed)  Labs Reviewed  BASIC METABOLIC PANEL - Abnormal; Notable for the following components:      Result Value   Sodium 132 (*)    CO2 17 (*)    Glucose, Bld 114 (*)    BUN 36 (*)    Creatinine, Ser 2.47 (*)    Calcium 8.3 (*)    GFR, Estimated 28 (*)    All other components within normal limits  CBC - Abnormal; Notable for the following components:   RBC 3.79 (*)    Hemoglobin 10.9 (*)    HCT 33.0 (*)    All other components within normal limits  TROPONIN I (HIGH SENSITIVITY) - Abnormal; Notable for the following components:   Troponin I (High Sensitivity) 76 (*)    All other components within normal limits  TROPONIN I (HIGH SENSITIVITY) - Abnormal; Notable for the following components:   Troponin I (High Sensitivity) 103 (*)    All other components within normal limits  RESP PANEL BY RT-PCR (FLU A&B, COVID) [id number]   TYPE AND SCREEN   ____________________________________________  EKG  ED ECG REPORT I, Naaman Plummer, the attending physician, personally viewed and interpreted this ECG.  Date: 10/30/2020 EKG Time: 1044 Rate: 88 Rhythm: normal sinus rhythm QRS Axis: normal Intervals: normal ST/T Wave abnormalities: normal Narrative Interpretation: no evidence of acute ischemia  ____________________________________________  RADIOLOGY  ED MD interpretation: 2 view x-ray of the chest shows pulmonary venous hypertension without evidence of frank edema  CT of the chest/abdomen/pelvis shows evidence of the incidental findings listed below without any evidence of acute abnormalities including no collections of fluid in the GI tract or lungs  Official radiology report(s): DG Chest 2 View  Result Date: 10/30/2020 CLINICAL DATA:  Chest pain and shortness of breath.  Hematemesis. EXAM: CHEST - 2 VIEW COMPARISON:  11/04/2020 FINDINGS: Heart size upper limits of normal. Atherosclerosis and tortuosity of the aorta. Pulmonary venous hypertension without frank edema. No infiltrate, collapse or effusion. Ordinary degenerative changes affect the spine. IMPRESSION: Pulmonary venous hypertension pattern but without evidence of frank edema. No infiltrate, collapse or effusion. Aortic atherosclerosis. Electronically Signed   By: Nelson Chimes M.D.   On: 10/30/2020 11:26   CT CHEST ABDOMEN PELVIS WO CONTRAST  Result Date: 10/30/2020 CLINICAL DATA:  65 year old male with history of hematemesis and hemoptysis. Substernal chest pain and cough. Shortness of breath. EXAM: CT CHEST, ABDOMEN AND PELVIS WITHOUT CONTRAST TECHNIQUE: Multidetector CT imaging of the chest, abdomen and pelvis was performed following the standard protocol without IV contrast. COMPARISON:  CTA of the chest 01/24/2020. CT the abdomen and pelvis 12/10/2019. FINDINGS: CT CHEST FINDINGS Cardiovascular: Heart size is normal. There is no significant  pericardial fluid, thickening or pericardial calcification. There is aortic atherosclerosis, as well as atherosclerosis of the great vessels of the mediastinum and the coronary arteries, including calcified atherosclerotic plaque in the left main, left anterior descending and left circumflex coronary arteries. Mediastinum/Nodes: No pathologically enlarged mediastinal or hilar lymph nodes. Please note that accurate exclusion of hilar adenopathy is limited on noncontrast CT  scans. Esophagus is unremarkable in appearance. No axillary lymphadenopathy. Lungs/Pleura: No suspicious appearing pulmonary nodules or masses are noted. No acute consolidative airspace disease. No pleural effusions. Musculoskeletal: There are no aggressive appearing lytic or blastic lesions noted in the visualized portions of the skeleton. CT ABDOMEN PELVIS FINDINGS Hepatobiliary: No definite suspicious cystic or solid hepatic lesions are confidently identified on today's noncontrast CT examination. Status post cholecystectomy. Pancreas: No definite pancreatic mass or peripancreatic fluid collections or inflammatory changes noted on today's noncontrast CT examination. Spleen: Multiple calcified granulomas scattered throughout the spleen. Adrenals/Urinary Tract: Status post left radical nephrectomy. Multiple right renal lesions, incompletely characterized on today's non-contrast CT examination, but statistically likely to represent cysts, largest of which measures 7.1 cm in the posterior aspect of the interpolar region of the right kidney. Additional complex lesion in the lower pole of the right kidney which demonstrates some internal high attenuation measuring 2.9 cm in diameter (coronal image one hundred eight of series 5). Right adrenal gland is normal. There is a 7.7 cm low-attenuation lesion associated with the left adrenal gland, incompletely characterized on today's examination, but slowly growing when compared to prior studies (5.1 x 4.1 cm  on remote prior CT the abdomen and pelvis 01/31/2014), favored to represent an adrenal cyst, although a slow growing adrenal neoplasm is difficult to entirely exclude. No hydroureteronephrosis. Urinary bladder is normal in appearance. Stomach/Bowel: The appearance of the stomach is normal. There is no pathologic dilatation of small bowel or colon. Numerous colonic diverticulae are noted, particularly in the sigmoid colon, without surrounding phlegm a tori changes to suggest an acute diverticulitis at this time. Normal appendix. Vascular/Lymphatic: Aortic atherosclerosis. No lymphadenopathy noted in the abdomen or pelvis. Reproductive: Prostate gland and seminal vesicles are unremarkable in appearance. Other: Large umbilical hernia containing only omental fat. No significant volume of ascites. No pneumoperitoneum. Musculoskeletal: There are no aggressive appearing lytic or blastic lesions noted in the visualized portions of the skeleton. IMPRESSION: 1. No acute findings are noted in the chest, abdomen or pelvis to account for the patient's hematemesis or hemoptysis. 2. Slowly growing left adrenal lesion which may simply represent a mildly complex adrenal cyst, however, the possibility of a slowly growing adrenal neoplasm warrants consideration. Further evaluation with nonemergent adrenal protocol CT scan is recommended in the near future to provide more definitive characterization. 3. At the time of follow-up adrenal protocol CT, attention to the indeterminate 2.9 cm lesion in the lower pole of the right kidney is recommended to exclude neoplasm. 4. Colonic diverticulosis without evidence of acute diverticulitis at this time. 5. Large umbilical hernia containing only omental fat. No associated bowel incarceration or obstruction at this time. 6. Aortic atherosclerosis, in addition to left main and 2 vessel coronary artery disease. Please note that although the presence of coronary artery calcium documents the presence  of coronary artery disease, the severity of this disease and any potential stenosis cannot be assessed on this non-gated CT examination. Assessment for potential risk factor modification, dietary therapy or pharmacologic therapy may be warranted, if clinically indicated. 7. Additional incidental findings, as above. Electronically Signed   By: Vinnie Langton M.D.   On: 10/30/2020 15:15    ____________________________________________   PROCEDURES  Procedure(s) performed (including Critical Care):  .1-3 Lead EKG Interpretation Performed by: Naaman Plummer, MD Authorized by: Naaman Plummer, MD     Interpretation: normal     ECG rate:  63   ECG rate assessment: normal     Rhythm: sinus  rhythm     Ectopy: none     Conduction: normal       ____________________________________________   INITIAL IMPRESSION / ASSESSMENT AND PLAN / ED COURSE  As part of my medical decision making, I reviewed the following data within the [facility] notes reviewed and incorporated, Labs reviewed, EKG interpreted, Old chart reviewed, Radiograph reviewed and Notes from prior ED visits reviewed and incorporated        Workup: ECG, CXR, CBC, BMP, Troponin Findings: ECG: No overt evidence of STEMI. No evidence of Brugadas sign, delta wave, epsilon wave, significantly prolonged QTc, or malignant arrhythmia HS Troponin: Negative x1 Other Labs unremarkable for emergent problems. CXR: Without PTX, PNA, or widened mediastinum Last Stress Test:  10/06/20 Last Heart Catheterization: Unknown HEART Score: 6  Given History, Exam, and Workup I have low suspicion for ACS, Pneumothorax, Pneumonia, Pulmonary Embolus, Tamponade, Aortic Dissection or other emergent problem as a cause for this presentation.   High Risk Chest Pain Patient at increased risk for Major Adverse Cardiac Event (AMI, PCI, CABG, death) Interventions: ASA 389m Defer Heparin drip as patient pain free at this time,    Disposition: Admit for continued cardiac monitoring and trending of troponins as well as further evaluation for potential inpatient stress testing vs cardiac catheterization and coronary angiography.      ____________________________________________   FINAL CLINICAL IMPRESSION(S) / ED DIAGNOSES  Final diagnoses:  Hematemesis  Chest pain with high risk for cardiac etiology  Elevated troponin     ED Discharge Orders    None       Note:  This document was prepared using Dragon voice recognition software and may include unintentional dictation errors.   BNaaman Plummer MD 10/30/20 1536

## 2020-10-30 NOTE — ED Notes (Addendum)
Dr Cheri Fowler notified of critical lab value: troponin 103.

## 2020-10-30 NOTE — ED Notes (Signed)
Dr Francine Graven at patient's bedside, states she would like an ambulatory pulse ox and if it is okay we will discharge from ED. States not to transport pt upstairs to assigned room.

## 2020-10-30 NOTE — ED Notes (Signed)
Pt given ice chips. Pharmacy tech at bedside reconciling home meds.

## 2020-10-30 NOTE — ED Notes (Signed)
Pt back from CT, cardiac monitoring resumed. Stretcher locked in low position, side rails up x2, call light in reach.

## 2020-10-30 NOTE — Patient Instructions (Signed)
We are taking you to the emergency room today.   Medication Instructions:  No changes at this time.  *If you need a refill on your cardiac medications before your next appointment, please call your pharmacy*   Lab Work: None  If you have labs (blood work) drawn today and your tests are completely normal, you will receive your results only by: Marland Kitchen MyChart Message (if you have MyChart) OR . A paper copy in the mail If you have any lab test that is abnormal or we need to change your treatment, we will call you to review the results.   Testing/Procedures: None   Follow-Up: At [facility], you and your health needs are our priority.  As part of our continuing mission to provide you with exceptional heart care, we have created designated Provider Care Teams.  These Care Teams include your primary Cardiologist (physician) and Advanced Practice Providers (APPs -  Physician Assistants and Nurse Practitioners) who all work together to provide you with the care you need, when you need it.  We recommend signing up for the patient portal called "MyChart".  Sign up information is provided on this After Visit Summary.  MyChart is used to connect with patients for Virtual Visits (Telemedicine).  Patients are able to view lab/test results, encounter notes, upcoming appointments, etc.  Non-urgent messages can be sent to your provider as well.   To learn more about what you can do with MyChart, go to [email or web address].    Your next appointment:   1 month(s)  The format for your next appointment:   In Person  Provider:   [name], MD or Christell Faith, PA-C

## 2020-10-30 NOTE — ED Triage Notes (Signed)
Pt to ED via POV, pt states sent by Dr. Rockey Situ, states "vomiting blood" all day yesterday, states watery emesis and noted drops of blood of nose and intermittently. Pt also c/o substernal CP. Pt also c/o cough and SOB. Pt states hx of COPD, is able to talk in full and complete sentences at this time.

## 2020-10-30 NOTE — H&P (Signed)
History and Physical    Larry Callahan OAC:[id number] DOB: 1955-10-04 DOA: 10/30/2020  PCP: Lavera Guise, MD   Patient coming from: Home  I have personally briefly reviewed patient's old medical records in [place]  Chief Complaint: Chest pain  HPI: Larry Callahan is a 65 y.o. male with medical history significant  for morbid obesity, coronary artery disease, type 2 diabetes mellitus with complications of stage III chronic kidney disease, GERD, hypertension, dyslipidemia, COPD and obstructive sleep apnea who presented to the emergency room for evaluation of chest pain mostly midsternal which she has had for about 2 days associated with cough and shortness of breath. Patient was seen at the cardiologist office today and complained about having hematemesis, nosebleeds and rectal bleeding and so was sent to the ER for evaluation. In the ER Hemoccult was negative and patient has not had any episodes of hematemesis or nosebleeds since his stay in the ED. He complains of chest pain which is non radiating and not related to rest or exertion.  He describes it as a stabbing sensation mostly when he palpates his chest.   He denies having any fever or chills and denies having any sick contacts. He denies having any abdominal pain, no nausea, no vomiting, no diarrhea, no dizziness, no lightheadedness, no headache, no urinary symptoms, no blurred vision, no focal deficits. He denies any NSAID use. Labs show sodium 132, potassium 4.3, chloride 106, bicarb 17, glucose 114, BUN 36, creatinine 2.47, calcium 8.3, troponin 76 >> 103, white count 4.5, hemoglobin 10.9, hematocrit 33, MCV 87.1, RDW 14, platelet count 197 Influenza A PCR test is positive Chest x-ray reviewed by me shows pulmonary venous hypertension pattern but without evidence of frank edema. No infiltrate, collapse or effusion. Aortic atherosclerosis. CT scan of the chest abdomen and pelvis showed no acute findings are  noted in the chest, abdomen or pelvis to account for the patient's hematemesis or hemoptysis. Slowly growing left adrenal lesion which may simply represent a mildly complex adrenal cyst, however, the possibility of a slowly growing adrenal neoplasm warrants consideration. Further evaluation with nonemergent adrenal protocol CT scan is recommended in the near future to provide more definitive characterization. At the time of follow-up adrenal protocol CT, attention to the indeterminate 2.9 cm lesion in the lower pole of the right kidney is recommended to exclude neoplasm. Colonic diverticulosis without evidence of acute diverticulitis at this time. Large umbilical hernia containing only omental fat. No associated bowel incarceration or obstruction at this time. Aortic atherosclerosis, in addition to left main and 2 vessel coronary artery disease. Please note that although the presence of coronary artery calcium documents the presence of coronary artery disease, the severity of this disease and any potential stenosis cannot be assessed on this non-gated CT examination. Assessment for potential risk factor modification, dietary therapy or pharmacologic therapy may be warranted, if clinically indicated. Twelve-lead EKG reviewed by me shows sinus rhythm with PACs.       ED Course: Patient is a 65 year old Caucasian male who presents to the ER for evaluation of multiple symptoms which include hematemesis, rectal bleeding and chest pain.  He bumped his troponin and has no acute EKG changes and admission was requested due to his cardiac risk factors. Patient had a Lexiscan stress test on 10/08/20 which was negative for ischemia after he was hospitalized for chest pain evaluation.  Stress test was read as a moderate risk scan because of his depressed ejection fraction of 35 to 40%  and medical therapy was advised since patient has chronic kidney disease. Attempts were made to discharge patient from the  ER, but he states that he feels very bad and is unable to go home.  His influenza A test is positive.   He will be referred to observation status for further evaluation.    Review of Systems: As per HPI otherwise all other systems reviewed and negative.    Past Medical History:  Diagnosis Date  . Allergy   . Coronary artery disease   . Diabetes mellitus without complication ([place])   . Diastolic CHF ([place])   . GERD (gastroesophageal reflux disease)   . Hyperlipidemia   . Hypertension   . Renal insufficiency   . Sleep apnea     Past Surgical History:  Procedure Laterality Date  . BACK SURGERY    . CARDIAC CATHETERIZATION    . CHOLECYSTECTOMY    . COLONOSCOPY WITH PROPOFOL N/A 04/12/2019   Procedure: COLONOSCOPY WITH PROPOFOL;  Surgeon: [name], MD;  Location: [facility] ENDOSCOPY;  Service: Gastroenterology;  Laterality: N/A;  . ESOPHAGOGASTRODUODENOSCOPY N/A 04/12/2019   Procedure: ESOPHAGOGASTRODUODENOSCOPY (EGD);  Surgeon: [name], MD;  Location: [facility] ENDOSCOPY;  Service: Gastroenterology;  Laterality: N/A;  . ESOPHAGOGASTRODUODENOSCOPY (EGD) WITH PROPOFOL N/A 11/21/2019   Procedure: ESOPHAGOGASTRODUODENOSCOPY (EGD) WITH PROPOFOL;  Surgeon: [name], MD;  Location: [facility] ENDOSCOPY;  Service: Gastroenterology;  Laterality: N/A;  . FLEXIBLE BRONCHOSCOPY Bilateral 05/17/2019   Procedure: FLEXIBLE BRONCHOSCOPY;  Surgeon: [name], MD;  Location: [name] ORS;  Service: Pulmonary;  Laterality: Bilateral;  . left arm surgery    . nephrectomy Left   . PARATHYROIDECTOMY    . RIGHT HEART CATH N/A 03/29/2019   Procedure: RIGHT HEART CATH;  Surgeon: [name], MD;  Location: [facility];  Service: Cardiovascular;  Laterality: N/A;     reports that he has quit smoking. His smoking use included cigarettes. He has quit using smokeless tobacco.  His smokeless tobacco use included chew. He reports that he does not drink alcohol and does not use  drugs.  Allergies  Allergen Reactions  . Penicillins Rash    Family History  Problem Relation Age of Onset  . Diabetes Mother        2003  . Lung cancer Father   . Diabetes Sister   . Hypertension Sister   . Heart disease Sister   . Cancer Sister   . Bone cancer Brother       Prior to Admission medications   Medication Sig Start Date End Date Taking? Authorizing Provider  albuterol (VENTOLIN HFA) 108 (90 Base) MCG/ACT inhaler Inhale 2 puffs into the lungs every 6 (six) hours as needed for wheezing or shortness of breath. 08/31/20  Yes Lavera Guise, MD  aspirin EC 81 MG tablet Take 1 tablet (81 mg total) by mouth daily. Swallow whole. Patient taking differently: Take 81 mg by mouth in the morning and at bedtime. Swallow whole. 09/26/20 09/26/21 Yes Nita Sells, MD  budesonide-formoterol (SYMBICORT) 160-4.5 MCG/ACT inhaler Inhale 2 puffs into the lungs 2 (two) times daily. 07/20/20  Yes Lavera Guise, MD  carvedilol (COREG) 6.25 MG tablet Take 1 tablet (6.25 mg total) by mouth 2 (two) times daily with a meal. 10/08/20  Yes Jennye Boroughs, MD  cetirizine (ZYRTEC) 10 MG tablet Take 10 mg by mouth daily.   Yes [provider]  escitalopram (LEXAPRO) 10 MG tablet Take one tab po qd for anxiety with food Patient  taking differently: Take 10 mg by mouth daily. Take one tab po qd for anxiety with food 07/17/20  Yes Lavera Guise, MD  ferrous gluconate (FERGON) 324 MG tablet Take 1 tablet (324 mg total) by mouth daily with breakfast. 08/01/19  Yes [name], NP  furosemide (LASIX) 40 MG tablet Take one to tabs once or twice days for fluid // swelling 07/17/20  Yes Lavera Guise, MD  gabapentin (NEURONTIN) 100 MG capsule Take 1 capsule (100 mg total) by mouth in the morning, at noon, and at bedtime. 07/17/20  Yes Lavera Guise, MD  glimepiride (AMARYL) 2 MG tablet Take one tab po qd with supper 07/17/20  Yes Lavera Guise, MD  hydrALAZINE (APRESOLINE) 100 MG tablet Take 1 tablet  (100 mg total) by mouth 3 (three) times daily. 07/17/20  Yes Lavera Guise, MD  insulin glargine, 1 Unit Dial, (TOUJEO) 300 UNIT/ML Solostar Pen Inject 26 Units into the skin daily. 04/30/20  Yes Boscia, Heather E, NP  ipratropium-albuterol (DUONEB) 0.5-2.5 (3) MG/3ML SOLN Take 3 mLs by nebulization every 6 (six) hours as needed. 10/11/20  Yes Lavera Guise, MD  isosorbide mononitrate (IMDUR) 30 MG 24 hr tablet Take 1 tablet (30 mg total) by mouth 2 (two) times daily. 10/08/20  Yes Jennye Boroughs, MD  mupirocin ointment (BACTROBAN) 2 % Apply 1 application topically daily. Qd to excision site 10/06/20  Yes Ralene Bathe, MD  omeprazole (PRILOSEC) 40 MG capsule Take 1 capsule (40 mg total) by mouth in the morning and at bedtime. 07/17/20  Yes Lavera Guise, MD  potassium chloride SA (KLOR-CON) 20 MEQ tablet Take 1 tablet (20 mEq total) by mouth 2 (two) times daily. 07/17/20  Yes Lavera Guise, MD  tiotropium (SPIRIVA HANDIHALER) 18 MCG inhalation capsule Place 1 capsule (18 mcg total) into inhaler and inhale daily. 06/02/20 08/31/20 Yes [name], NP  Blood Glucose Calibration (TRUE METRIX LEVEL 1) Low SOLN Use as directed 04/04/18   [name], NP  Blood Glucose Monitoring Suppl (TRUE METRIX AIR GLUCOSE METER) DEVI 1 Device by Does not apply route 2 (two) times daily. 04/13/18   [name], NP  glucose blood (TRUE METRIX BLOOD GLUCOSE TEST) test strip Use as instructed twice daily diag E11.65 07/30/19   [name], NP  NOVOFINE PEN NEEDLE 32G X 6 MM MISC Use as directed 10/15/20   Lavera Guise, MD  TRUEplus Lancets 33G MISC Use as directed twice daily diag E11.65 07/30/19   [name], NP    Physical Exam: Vitals:   10/30/20 1500 10/30/20 1600 10/30/20 1630 10/30/20 1700  BP: (!) 149/71 (!) 152/71    Pulse: 64 65    Resp: 16 13    Temp:   98.3 F (36.8 C)   TempSrc:   Oral   SpO2: 97% 96%  (S) 92%  Weight:      Height:         Vitals:   10/30/20 1500 10/30/20  1600 10/30/20 1630 10/30/20 1700  BP: (!) 149/71 (!) 152/71    Pulse: 64 65    Resp: 16 13    Temp:   98.3 F (36.8 C)   TempSrc:   Oral   SpO2: 97% 96%  (S) 92%  Weight:      Height:          Constitutional: Alert and oriented x 3. Not in any apparent distress HEENT:  Head: Normocephalic and atraumatic.         Eyes: PERLA, EOMI, Conjunctivae are normal. Sclera is non-icteric.       Mouth/Throat: Mucous membranes are moist.       Neck: Supple with no signs of meningismus. Cardiovascular: Regular rate and rhythm. No murmurs, gallops, or rubs. 2+ symmetrical distal pulses are present . No JVD. No LE edema Respiratory: Respiratory effort normal .Lungs sounds clear bilaterally.  Scattered wheezes bilaterally, no crackles, or rhonchi.  Gastrointestinal: Soft, non tender, and non distended with positive bowel sounds.  Central adiposity Genitourinary: No CVA tenderness. Musculoskeletal: Nontender with normal range of motion in all extremities. No cyanosis, or erythema of extremities. Neurologic:  Face is symmetric. Moving all extremities. No gross focal neurologic deficits  Skin: Skin is warm, dry.  No rash or ulcers Psychiatric: Mood and affect are normal   Labs on Admission: I have personally reviewed following labs and imaging studies  CBC: Recent Labs  Lab 10/30/20 1045  WBC 4.5  HGB 10.9*  HCT 33.0*  MCV 87.1  PLT 161   Basic Metabolic Panel: Recent Labs  Lab 10/30/20 1045  NA 132*  K 4.3  CL 106  CO2 17*  GLUCOSE 114*  BUN 36*  CREATININE 2.47*  CALCIUM 8.3*   GFR: Estimated Creatinine Clearance: 36.7 mL/min (A) (by C-G formula based on SCr of 2.47 mg/dL (H)). Liver Function Tests: No results for input(s): AST, ALT, ALKPHOS, BILITOT, PROT, ALBUMIN in the last 168 hours. No results for input(s): LIPASE, AMYLASE in the last 168 hours. No results for input(s): AMMONIA in the last 168 hours. Coagulation Profile: No results for input(s): INR, PROTIME in  the last 168 hours. Cardiac Enzymes: No results for input(s): CKTOTAL, CKMB, CKMBINDEX, TROPONINI in the last 168 hours. BNP (last 3 results) No results for input(s): PROBNP in the last 8760 hours. HbA1C: No results for input(s): HGBA1C in the last 72 hours. CBG: No results for input(s): GLUCAP in the last 168 hours. Lipid Profile: No results for input(s): CHOL, HDL, LDLCALC, TRIG, CHOLHDL, LDLDIRECT in the last 72 hours. Thyroid Function Tests: No results for input(s): TSH, T4TOTAL, FREET4, T3FREE, THYROIDAB in the last 72 hours. Anemia Panel: No results for input(s): VITAMINB12, FOLATE, FERRITIN, TIBC, IRON, RETICCTPCT in the last 72 hours. Urine analysis:    Component Value Date/Time   COLORURINE YELLOW (A) 09/25/2020 1643   APPEARANCEUR HAZY (A) 09/25/2020 1643   APPEARANCEUR Cloudy (A) 11/23/2018 1103   LABSPEC 1.012 09/25/2020 1643   LABSPEC 1.005 03/06/2013 0929   PHURINE 5.0 09/25/2020 1643   GLUCOSEU 150 (A) 09/25/2020 1643   GLUCOSEU Negative 03/06/2013 0929   HGBUR NEGATIVE 09/25/2020 1643   BILIRUBINUR NEGATIVE 09/25/2020 1643   BILIRUBINUR negative 02/20/2019 1404   BILIRUBINUR Negative 11/23/2018 1103   BILIRUBINUR Negative 03/06/2013 0929   KETONESUR NEGATIVE 09/25/2020 1643   PROTEINUR >=300 (A) 09/25/2020 1643   UROBILINOGEN 0.2 02/20/2019 1404   NITRITE NEGATIVE 09/25/2020 1643   LEUKOCYTESUR NEGATIVE 09/25/2020 1643   LEUKOCYTESUR Negative 03/06/2013 0929    Radiological Exams on Admission: DG Chest 2 View  Result Date: 10/30/2020 CLINICAL DATA:  Chest pain and shortness of breath.  Hematemesis. EXAM: CHEST - 2 VIEW COMPARISON:  11/04/2020 FINDINGS: Heart size upper limits of normal. Atherosclerosis and tortuosity of the aorta. Pulmonary venous hypertension without frank edema. No infiltrate, collapse or effusion. Ordinary degenerative changes affect the spine. IMPRESSION: Pulmonary venous hypertension pattern but without evidence of frank edema. No  infiltrate, collapse  or effusion. Aortic atherosclerosis. Electronically Signed   By: Nelson Chimes M.D.   On: 10/30/2020 11:26   CT CHEST ABDOMEN PELVIS WO CONTRAST  Result Date: 10/30/2020 CLINICAL DATA:  65 year old male with history of hematemesis and hemoptysis. Substernal chest pain and cough. Shortness of breath. EXAM: CT CHEST, ABDOMEN AND PELVIS WITHOUT CONTRAST TECHNIQUE: Multidetector CT imaging of the chest, abdomen and pelvis was performed following the standard protocol without IV contrast. COMPARISON:  CTA of the chest 01/24/2020. CT the abdomen and pelvis 12/10/2019. FINDINGS: CT CHEST FINDINGS Cardiovascular: Heart size is normal. There is no significant pericardial fluid, thickening or pericardial calcification. There is aortic atherosclerosis, as well as atherosclerosis of the great vessels of the mediastinum and the coronary arteries, including calcified atherosclerotic plaque in the left main, left anterior descending and left circumflex coronary arteries. Mediastinum/Nodes: No pathologically enlarged mediastinal or hilar lymph nodes. Please note that accurate exclusion of hilar adenopathy is limited on noncontrast CT scans. Esophagus is unremarkable in appearance. No axillary lymphadenopathy. Lungs/Pleura: No suspicious appearing pulmonary nodules or masses are noted. No acute consolidative airspace disease. No pleural effusions. Musculoskeletal: There are no aggressive appearing lytic or blastic lesions noted in the visualized portions of the skeleton. CT ABDOMEN PELVIS FINDINGS Hepatobiliary: No definite suspicious cystic or solid hepatic lesions are confidently identified on today's noncontrast CT examination. Status post cholecystectomy. Pancreas: No definite pancreatic mass or peripancreatic fluid collections or inflammatory changes noted on today's noncontrast CT examination. Spleen: Multiple calcified granulomas scattered throughout the spleen. Adrenals/Urinary Tract: Status post left  radical nephrectomy. Multiple right renal lesions, incompletely characterized on today's non-contrast CT examination, but statistically likely to represent cysts, largest of which measures 7.1 cm in the posterior aspect of the interpolar region of the right kidney. Additional complex lesion in the lower pole of the right kidney which demonstrates some internal high attenuation measuring 2.9 cm in diameter (coronal image one hundred eight of series 5). Right adrenal gland is normal. 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 No hydroureteronephrosis. Urinary bladder is normal in appearance. Stomach/Bowel: The appearance of the stomach is normal. There is no pathologic dilatation of small bowel or colon. Numerous colonic diverticulae are noted, particularly in the sigmoid colon, without surrounding phlegm a tori changes to suggest an acute diverticulitis at this time. Normal appendix. Vascular/Lymphatic: Aortic atherosclerosis. No lymphadenopathy noted in the abdomen or pelvis. Reproductive: Prostate gland and seminal vesicles are unremarkable in appearance. Other: Large umbilical hernia containing only omental fat. No significant volume of ascites. No pneumoperitoneum. Musculoskeletal: There are no aggressive appearing lytic or blastic lesions noted in the visualized portions of the skeleton. IMPRESSION: 1. No acute findings are noted in the chest, abdomen or pelvis to account for the patient's hematemesis or hemoptysis. 2. Slowly growing left adrenal lesion which may simply represent a mildly complex adrenal cyst, however, the possibility of a slowly growing adrenal neoplasm warrants consideration.  Further evaluation with nonemergent adrenal protocol CT scan is recommended in the near future to provide more definitive characterization. 3. At the time of follow-up adrenal protocol CT, attention to the indeterminate 2.9 cm lesion in the lower pole of the right kidney is recommended to exclude neoplasm. 4. Colonic diverticulosis without  evidence of acute diverticulitis at this time. 5. Large umbilical hernia containing only omental fat. No associated bowel incarceration or obstruction at this time. 6. Aortic atherosclerosis, in addition to left main and 2 vessel coronary artery disease. Please note that although the presence of coronary artery calcium documents the presence of coronary artery disease, the severity of this disease and any potential stenosis cannot be assessed on this non-gated CT examination. Assessment for potential risk factor modification, dietary therapy or pharmacologic therapy may be warranted, if clinically indicated. 7. Additional incidental findings, as above. Electronically Signed   By: Vinnie Langton M.D.   On: 10/30/2020 15:15     Assessment/Plan Principal Problem:   Chest pain Active Problems:   COPD (chronic obstructive pulmonary disease) (HCC)   Essential hypertension   Chronic systolic CHF (congestive heart failure) (HCC)   CKD stage 3 due to type 2 diabetes mellitus (HCC)   Obesity (BMI 30-39.9)   Elevated troponin I level   Influenza A         Chest pain Atypical, most likely to be related to costochondritis since this is reproducible Trial of steroids   Influenza A Place patient on contact and respiratory isolation Place patient on Tamiflu 75 mg p.o. twice daily for 5 days    Diabetes mellitus with complications of stage IIIb chronic kidney disease Maintain consistent carbohydrate diet Glycemic control with sliding scale insulin Accu-Cheks before meals and at bedtime Hold glimepiride     Coronary artery disease/chronic systolic  heart failure Last known LVEF of 35 to 40% Patient had a recent Lexiscan stress test which was read as moderate risk scan and medical management was recommended. He has a bump in his troponin from 76 >> 130 (unclear significance) Continue carvedilol, nitrates, aspirin and statins We will cycle cardiac enzymes Continue furosemide, carvedilol,  nitrates and hydralazine for patient's heart failure Maintain low-sodium diet    COPD Patient has scattered wheezes in both lung fields Continue as needed bronchodilator therapy as well as inhaled steroids    Depression Continue Lexapro    Obesity Complicates overall prognosis and care    DVT prophylaxis: Lovenox Code Status: full code Family Communication: Greater than 50% of time was spent discussing patient's condition and plan of care with him at the bedside.  All questions and concerns have been addressed.  He verbalizes understanding and agrees with the plan. Disposition Plan: Back to previous home environment Consults called: none Status:Observation    Beulah Capobianco MD [facility]     10/30/2020, 5:47 PM

## 2020-10-31 ENCOUNTER — Other Ambulatory Visit: Payer: Self-pay

## 2020-10-31 ENCOUNTER — Encounter: Payer: Self-pay | Admitting: *Deleted

## 2020-10-31 ENCOUNTER — Emergency Department: Payer: Medicare PPO

## 2020-10-31 ENCOUNTER — Ambulatory Visit: Payer: Self-pay

## 2020-10-31 ENCOUNTER — Emergency Department
Admission: EM | Admit: 2020-10-31 | Discharge: 2020-10-31 | Disposition: A | Discharge status: Home / Self Care | Payer: Medicare PPO | Source: Home / Self Care | Attending: Emergency Medicine | Admitting: Emergency Medicine

## 2020-10-31 DIAGNOSIS — J101 Influenza due to other identified influenza virus with other respiratory manifestations: Secondary | ICD-10-CM | POA: Diagnosis not present

## 2020-10-31 DIAGNOSIS — Z79899 Other long term (current) drug therapy: Secondary | ICD-10-CM | POA: Insufficient documentation

## 2020-10-31 DIAGNOSIS — I2510 Atherosclerotic heart disease of native coronary artery without angina pectoris: Secondary | ICD-10-CM | POA: Insufficient documentation

## 2020-10-31 DIAGNOSIS — I248 Other forms of acute ischemic heart disease: Secondary | ICD-10-CM

## 2020-10-31 DIAGNOSIS — E1122 Type 2 diabetes mellitus with diabetic chronic kidney disease: Secondary | ICD-10-CM | POA: Insufficient documentation

## 2020-10-31 DIAGNOSIS — N1832 Chronic kidney disease, stage 3b: Secondary | ICD-10-CM | POA: Insufficient documentation

## 2020-10-31 DIAGNOSIS — R0602 Shortness of breath: Secondary | ICD-10-CM | POA: Diagnosis not present

## 2020-10-31 DIAGNOSIS — I5042 Chronic combined systolic (congestive) and diastolic (congestive) heart failure: Secondary | ICD-10-CM | POA: Insufficient documentation

## 2020-10-31 DIAGNOSIS — Z8616 Personal history of COVID-19: Secondary | ICD-10-CM | POA: Insufficient documentation

## 2020-10-31 DIAGNOSIS — Z7982 Long term (current) use of aspirin: Secondary | ICD-10-CM | POA: Insufficient documentation

## 2020-10-31 DIAGNOSIS — Z87891 Personal history of nicotine dependence: Secondary | ICD-10-CM | POA: Insufficient documentation

## 2020-10-31 DIAGNOSIS — E786 Lipoprotein deficiency: Secondary | ICD-10-CM | POA: Insufficient documentation

## 2020-10-31 DIAGNOSIS — J441 Chronic obstructive pulmonary disease with (acute) exacerbation: Secondary | ICD-10-CM | POA: Insufficient documentation

## 2020-10-31 DIAGNOSIS — R079 Chest pain, unspecified: Secondary | ICD-10-CM | POA: Diagnosis not present

## 2020-10-31 DIAGNOSIS — E1169 Type 2 diabetes mellitus with other specified complication: Secondary | ICD-10-CM | POA: Insufficient documentation

## 2020-10-31 DIAGNOSIS — Z7984 Long term (current) use of oral hypoglycemic drugs: Secondary | ICD-10-CM | POA: Insufficient documentation

## 2020-10-31 DIAGNOSIS — I130 Hypertensive heart and chronic kidney disease with heart failure and stage 1 through stage 4 chronic kidney disease, or unspecified chronic kidney disease: Secondary | ICD-10-CM | POA: Insufficient documentation

## 2020-10-31 DIAGNOSIS — J111 Influenza due to unidentified influenza virus with other respiratory manifestations: Secondary | ICD-10-CM | POA: Diagnosis not present

## 2020-10-31 DIAGNOSIS — R0902 Hypoxemia: Secondary | ICD-10-CM | POA: Diagnosis not present

## 2020-10-31 DIAGNOSIS — I5022 Chronic systolic (congestive) heart failure: Secondary | ICD-10-CM | POA: Diagnosis not present

## 2020-10-31 DIAGNOSIS — R069 Unspecified abnormalities of breathing: Secondary | ICD-10-CM | POA: Diagnosis not present

## 2020-10-31 LAB — BASIC METABOLIC PANEL
Anion gap: 6 (ref 5–15)
Anion gap: 9 (ref 5–15)
BUN: 39 mg/dL — ABNORMAL HIGH (ref 8–23)
BUN: 45 mg/dL — ABNORMAL HIGH (ref 8–23)
CO2: 20 mmol/L — ABNORMAL LOW (ref 22–32)
CO2: 19 mmol/L — ABNORMAL LOW (ref 22–32)
Calcium: 8.2 mg/dL — ABNORMAL LOW (ref 8.9–10.3)
Calcium: 8.2 mg/dL — ABNORMAL LOW (ref 8.9–10.3)
Chloride: 108 mmol/L (ref 98–111)
Chloride: 106 mmol/L (ref 98–111)
Creatinine, Ser: 2.67 mg/dL — ABNORMAL HIGH (ref 0.61–1.24)
Creatinine, Ser: 2.69 mg/dL — ABNORMAL HIGH (ref 0.61–1.24)
GFR, Estimated: 25 mL/min — ABNORMAL LOW (ref >60)
GFR, Estimated: 25 mL/min — ABNORMAL LOW (ref >60)
Glucose, Bld: 241 mg/dL — ABNORMAL HIGH (ref 70–99)
Glucose, Bld: 126 mg/dL — ABNORMAL HIGH (ref 70–99)
Potassium: 4.9 mmol/L (ref 3.5–5.1)
Potassium: 4.6 mmol/L (ref 3.5–5.1)
Sodium: 134 mmol/L — ABNORMAL LOW (ref 135–145)
Sodium: 134 mmol/L — ABNORMAL LOW (ref 135–145)

## 2020-10-31 LAB — CBC WITH DIFFERENTIAL/PLATELET
Abs Immature Granulocytes: 0.03 10*3/uL (ref 0.00–0.07)
Basophils Absolute: 0 10*3/uL (ref 0.0–0.1)
Basophils Relative: 1 %
Eosinophils Absolute: 0.1 10*3/uL (ref 0.0–0.5)
Eosinophils Relative: 1 %
HCT: 33.5 % — ABNORMAL LOW (ref 39.0–52.0)
Hemoglobin: 11 g/dL — ABNORMAL LOW (ref 13.0–17.0)
Immature Granulocytes: 1 %
Lymphocytes Relative: 35 %
Lymphs Abs: 1.9 10*3/uL (ref 0.7–4.0)
MCH: 28.6 pg (ref 26.0–34.0)
MCHC: 32.8 g/dL (ref 30.0–36.0)
MCV: 87 fL (ref 80.0–100.0)
Monocytes Absolute: 0.7 10*3/uL (ref 0.1–1.0)
Monocytes Relative: 13 %
Neutro Abs: 2.8 10*3/uL (ref 1.7–7.7)
Neutrophils Relative %: 49 %
Platelets: 205 10*3/uL (ref 150–400)
RBC: 3.85 MIL/uL — ABNORMAL LOW (ref 4.22–5.81)
RDW: 13.7 % (ref 11.5–15.5)
WBC: 5.6 10*3/uL (ref 4.0–10.5)
nRBC: 0 % (ref 0.0–0.2)

## 2020-10-31 LAB — CBC
HCT: 31.5 % — ABNORMAL LOW (ref 39.0–52.0)
Hemoglobin: 10.1 g/dL — ABNORMAL LOW (ref 13.0–17.0)
MCH: 28.2 pg (ref 26.0–34.0)
MCHC: 32.1 g/dL (ref 30.0–36.0)
MCV: 88 fL (ref 80.0–100.0)
Platelets: 164 10*3/uL (ref 150–400)
RBC: 3.58 MIL/uL — ABNORMAL LOW (ref 4.22–5.81)
RDW: 13.6 % (ref 11.5–15.5)
WBC: 2.9 10*3/uL — ABNORMAL LOW (ref 4.0–10.5)
nRBC: 0 % (ref 0.0–0.2)

## 2020-10-31 LAB — TROPONIN I (HIGH SENSITIVITY): Troponin I (High Sensitivity): 68 ng/L — ABNORMAL HIGH (ref <18)

## 2020-10-31 LAB — GLUCOSE, CAPILLARY
Glucose-Capillary: 230 mg/dL — ABNORMAL HIGH (ref 70–99)
Glucose-Capillary: 222 mg/dL — ABNORMAL HIGH (ref 70–99)

## 2020-10-31 LAB — BRAIN NATRIURETIC PEPTIDE: B Natriuretic Peptide: 37.7 pg/mL (ref 0.0–100.0)

## 2020-10-31 MED ORDER — PREDNISONE 20 MG PO TABS: 20.0000 mg | ORAL_TABLET | Freq: Every day | ORAL | 0 refills | Status: AC

## 2020-10-31 MED ORDER — OSELTAMIVIR PHOSPHATE 30 MG PO CAPS
30.0000 mg | ORAL_CAPSULE | Freq: Two times a day (BID) | ORAL | Status: DC
  Administered 2020-10-31: 30 mg via ORAL
  Filled 2020-10-31 (×2): qty 1

## 2020-10-31 MED ORDER — METHYLPREDNISOLONE SODIUM SUCC 125 MG IJ SOLR
125.0000 mg | Freq: Once | INTRAMUSCULAR | Status: AC
  Administered 2020-10-31: 125 mg via INTRAMUSCULAR
  Filled 2020-10-31: qty 2

## 2020-10-31 MED ORDER — ASPIRIN 81 MG PO TBEC: 81.0000 mg | DELAYED_RELEASE_TABLET | Freq: Every day | ORAL | 11 refills | Status: DC

## 2020-10-31 MED ORDER — IPRATROPIUM-ALBUTEROL 0.5-2.5 (3) MG/3ML IN SOLN
3.0000 mL | Freq: Once | RESPIRATORY_TRACT | Status: AC
  Administered 2020-10-31: 3 mL via RESPIRATORY_TRACT
  Filled 2020-10-31: qty 3

## 2020-10-31 MED ORDER — OSELTAMIVIR PHOSPHATE 30 MG PO CAPS: 30.0000 mg | ORAL_CAPSULE | Freq: Two times a day (BID) | ORAL | 0 refills | Status: AC

## 2020-10-31 MED ORDER — IPRATROPIUM-ALBUTEROL 0.5-2.5 (3) MG/3ML IN SOLN
3.0000 mL | Freq: Once | RESPIRATORY_TRACT | Status: AC
  Administered 2020-10-31: 3 mL via RESPIRATORY_TRACT

## 2020-10-31 MED ORDER — LORATADINE 10 MG PO TABS: 10.0000 mg | ORAL_TABLET | Freq: Every day | ORAL | Status: DC

## 2020-10-31 NOTE — Telephone Encounter (Signed)
Thank you :)

## 2020-10-31 NOTE — Telephone Encounter (Signed)
Pt called c/o that he thinks he was discharged too soon. Pt c/o unable to catch breathe, and stated his pharmacy is closed. Pt stated that he is unable to pick up his discharge medication. Pt c/o chest pain. Pt advised that he needs to call 911. Pt refused stating he wanted to take a shower. Advised pt that he needs to call 911 again. Pt stated he wanted to take a shower. Advised pt that I will call back  [phone] and advised him that will call 911 at that time.  Per chart review, pt dx with flu. Wears home oxygen at 2 LPM via [place].  Has h/o COPD Tamiflu,Prednisone, aspirin prescriptions are at pharmacy and pt will not have tonight.   Called pt back and then 911 and merge calls. 911 sending EMS now.

## 2020-10-31 NOTE — ED Provider Notes (Signed)
-----------------------------------------   9:05 PM on 10/31/2020 -----------------------------------------  Assuming care from NP [place].  In short, Larry Callahan is a 65 y.o. male with a chief complaint of wheezing.  Refer to the original H&P for additional details, but in short, the patient was recently admitted to the hospital for respiratory difficulties and was found to have influenza A in addition to his COPD.  He was discharged yesterday.  However, he was contacted by [facility] representative who wanted to ask his opinion of his hospitalization.  While on the phone with him, she reportedly decided that he sounded wheezy and insisted that he call 911 or return to the hospital.  The current plan of care is to trial him with breathing treatments and reassess.Marland Kitchen   ----------------------------------------- 12:41 AM on 11/01/2020 -----------------------------------------  (Note that documentation was delayed due to multiple ED patients requiring immediate care.)   I reassessed the patient after breathing treatments.  His lab work is stable and in fact his troponin level is continuing to come down.  His chronic kidney disease is stable with a creatinine of about 2.7.  His chest x-ray shows no evidence of acute disease.  I auscultated him after his breathing treatments and and only 1 area in his right lower lung does he have any appreciable expiratory wheezing.  He has not been hypoxemic since coming to the emergency department and is in no distress, not using accessory muscle usage or intercostal retractions, and is speaking in full sentences easily without difficulty.  He is understandably irritated because he was told to come back to the emergency department by a [facility] representative.  However explained that this person did what she thought was right but that we are seeing him now and that there is no indication that he needs to be admitted back to the hospital.  He confirmed to me  that he has "plenty, a box of the stuff" in regard to albuterol solution for his nebulizer.  I explained that he needs to give himself nebulizer treatments when he starts to feel wheezing, as frequently as every 2 hours, which is exactly what he would get if he stayed in the emergency department.  He needs the opportunity to improve from his influenza infection and continue to treat his COPD.  He states that he understands.  We provided a taxi [place] to get him home tonight and he knows to follow-up as an outpatient.   Hinda Kehr, MD 11/01/20 [phone]

## 2020-10-31 NOTE — Discharge Instructions (Addendum)
As we discussed, you need to continue using your albuterol nebulizer treatments as frequently as every two hours.  This is the same medication we have in the hospitalist.  Fill your prescriptions when you have the opportunity, but the breathing treatments are the most important medication.  Follow up with your regular doctor at the next available opportunity.

## 2020-10-31 NOTE — Progress Notes (Signed)
PHARMACY NOTE:  ANTIMICROBIAL RENAL DOSAGE ADJUSTMENT  Current antimicrobial regimen includes a mismatch between antimicrobial dosage and estimated renal function.  As per policy approved by the Pharmacy & Therapeutics and Medical Executive Committees, the antimicrobial dosage will be adjusted accordingly.  Current antimicrobial dosage:  Tamiflu 75 mg BID  Indication: Influenza  Renal Function:  Estimated Creatinine Clearance: 34 mL/min (A) (by C-G formula based on SCr of 2.67 mg/dL (H)).    Antimicrobial dosage has been changed to:  Tamiflu 30 mg BID  Thank you for allowing pharmacy to be a part of this patient's care.  Benita Gutter, [facility] 10/31/2020 7:26 AM

## 2020-10-31 NOTE — ED Triage Notes (Signed)
Per EMS report, patient had a follow-up call from [place] who called 911 for patient's c/o shortness of breath at rest and wheezing. Patient was discharged at 1600 from [facility] after being kept here for the "flu." Patient is vocal, no difficulty speaking. Patient was 94% on room air for EMT, and was placed on 3L O2 and was 97%.

## 2020-10-31 NOTE — ED Notes (Signed)
Sister [name] called- would like to be informed once pt dispo is made.

## 2020-10-31 NOTE — ED Notes (Signed)
Sister [name] updated on plan of care- states she is unable to drive pt home.

## 2020-10-31 NOTE — Plan of Care (Signed)

## 2020-10-31 NOTE — TOC Transition Note (Signed)
Transition of Care [facility]) - CM/SW Discharge Note   Patient Details  Name: Larry Callahan MRN: [id number] Date of Birth: [birth year]  Transition of Care [facility]) CM/SW Contact:  Izola Price, RN Phone Number: 10/31/2020, 1:46 PM   Clinical Narrative: Patient is being discharged today. States to Unit RN he is not on home oxygen and is on room air at 97% today. Taxi voucher provided to Unit RN via Personal assistant. Patient's pharmacy will be closed till Monday. Has [employer]. RN to let provider know. Simmie Davies RN CM       Final next level of care: Home/Self Care Barriers to Discharge: Barriers Resolved   Patient Goals and CMS Choice        Discharge Placement                       Discharge Plan and Services                DME Arranged: N/A DME Agency: NA       HH Arranged: NA HH Agency: NA        Social Determinants of Health (SDOH) Interventions     Readmission Risk Interventions Readmission Risk Prevention Plan 02/01/2020 01/31/2020 05/10/2019  Transportation Screening Complete Complete Complete  Medication Review [employer]) Complete Complete Complete  PCP or Specialist appointment within 3-5 days of discharge Complete Complete -  [place] or Home Care Consult Complete Complete -  SW Recovery Care/Counseling Consult Complete Complete -  SW Consult Not Complete Comments - - -  Palliative Care Screening Not Applicable Not Applicable Not [place] Not Applicable Not Applicable Not Applicable  Some recent data might be hidden

## 2020-10-31 NOTE — Consult Note (Signed)
Cardiology Consultation:   Patient ID: Larry Callahan MRN: [id number]; DOB: [birth year]  Admit date: 10/30/2020 Date of Consult: 10/31/2020  PCP:  Lavera Guise, MD   [facility] Providers Cardiologist:  [name], MD   {   Patient Profile:   Larry Callahan is a 65 y.o. 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 Reesa Chew.  History of Present Illness:   Larry Callahan is followed by Dr. Rockey Situ for the above cardiac issues.  Patient was evaluated in 2020 after several ED visits for dyspnea.  CTA was negative for PE, showed aortic atherosclerosis and coronary artery calcifications.  Echo showed EF 55 to 60%, mild LVH, diastolic dysfunction, normal RV function, severely dilated left atrium, trivial TR, mild aortic valve sclerosis.  Lexiscan MPI showed no significant ischemia was read as high risk due to EF 30%.  Due to persisting symptoms he was admitted again in 2020 with VQ scan low risk for PE.  Right heart cath showed normal heart pressures.  Symptoms are felt to be multifactorial due to COPD, obesity, physical deconditioning, anemia.  He was again admitted in late 2020 for dyspnea.  Repeat echo showed EF 45 to 50%.  He was admitted twice for 2020 initially for TIA.  Head CT showed no acute process.  Echo showed EF 35 to 40%, global hypokinesis, mildly dilated LV internal cavity size, normal LV diastolic function, normal RV systolic function and ventricular cavity size and mild MR.  Carotid artery ultrasound showed no significant stenosis of the bilateral ICAs with antegrade flow of the bilateral vertebral arteries.  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  In the ED he was noted to have irregular heartbeats including episodes of wide-complex tachycardia.  High sensitive troponin minimally elevated with a flat trend, 23> 26.  He was noted to have frequent PVCs and an accelerated idioventricular rhythm on telemetry.  [place] MPI 09/30/2020 showed no significant ischemia, large perfusion defect involving the inferior wall that was mildly better with stress than rest, images concerning for diaphragmatic attenuation artifact, though unable to exclude old inferior MI.  Overall this was read as moderate risk scan given depressed EF.  Medical therapy was advised.  He was seen in the ED 5 5 for shortness of breath and chest discomfort, and right leg discomfort.  ED work-up showed troponin 18 with a delta 19.  D-dimer elevated 1.14.  Chest x-ray showed left basilar focal pulmonary infiltrate possibly infectious versus inflammatory.  Lower extremity ultrasound negative for DVT.  VQ scan negative for PE.  EKG showed sinus with PACs.  He was discharged from the ED with outpatient follow-up advised.  Patient was seen in the office yesterday 10/30/2020 and reported feeling horrible with hemoptysis versus hematemesis, with associated chest pain.  He was referred to the ER for further evaluation  In the ER he reported chest pain, shortness of breath, cough and hemoptysis. Says symptoms started the day before. He felt very tired that day and took a nap. He woke up and felt horrible. Chest pain is sharp and  substernal, worse with inspiration. Also worse with palpation. No recent fever, chills, sick contact, LLE, orthopnea, pnd. No nausea, vomiting, diarrhea.   Vitals showed BP 133/78, pulse rate 84 bpm, respiratory rate 24, temperature 100.1 F, 94% O2.  Labs showed sodium 132, potassium 4.3, CO2 17, glucose 114, creatinine 2.47, BUN 36, WBC 4.5,  hemoglobin 10.9. HS troponin 76>103.  Respiratory panel positive for influenza A.  Chest x-ray with pulmonary venous hypertension, no edema or infiltrate, aortic atherosclerosis.  CTA chest showed no acute process, it showed two-vessel CAD and aortic atherosclerosis.  EKG showed no ischemic changes.  He was admitted for further work-up.    Past Medical History:  Diagnosis Date  . Allergy   . Coronary artery disease   . Diabetes mellitus without complication ([place])   . Diastolic CHF ([place])   . GERD (gastroesophageal reflux disease)   . Hyperlipidemia   . Hypertension   . Renal insufficiency   . Sleep apnea     Past Surgical History:  Procedure Laterality Date  . BACK SURGERY    . CARDIAC CATHETERIZATION    . CHOLECYSTECTOMY    . COLONOSCOPY WITH PROPOFOL N/A 04/12/2019   Procedure: COLONOSCOPY WITH PROPOFOL;  Surgeon: [name], MD;  Location: [facility] ENDOSCOPY;  Service: Gastroenterology;  Laterality: N/A;  . ESOPHAGOGASTRODUODENOSCOPY N/A 04/12/2019   Procedure: ESOPHAGOGASTRODUODENOSCOPY (EGD);  Surgeon: [name], MD;  Location: [facility] ENDOSCOPY;  Service: Gastroenterology;  Laterality: N/A;  . ESOPHAGOGASTRODUODENOSCOPY (EGD) WITH PROPOFOL N/A 11/21/2019   Procedure: ESOPHAGOGASTRODUODENOSCOPY (EGD) WITH PROPOFOL;  Surgeon: [name], MD;  Location: [facility] ENDOSCOPY;  Service: Gastroenterology;  Laterality: N/A;  . FLEXIBLE BRONCHOSCOPY Bilateral 05/17/2019   Procedure: FLEXIBLE BRONCHOSCOPY;  Surgeon: [name], MD;  Location: [name] ORS;  Service: Pulmonary;  Laterality: Bilateral;  . left arm surgery    . nephrectomy Left   . PARATHYROIDECTOMY    . RIGHT HEART CATH N/A 03/29/2019   Procedure: RIGHT HEART CATH;  Surgeon: [name], MD;  Location: [employer] CV LAB;  Service: Cardiovascular;  Laterality: N/A;     Home Medications:  Prior to Admission medications   Medication Sig Start Date End Date Taking? Authorizing Provider  albuterol  (VENTOLIN HFA) 108 (90 Base) MCG/ACT inhaler Inhale 2 puffs into the lungs every 6 (six) hours as needed for wheezing or shortness of breath. 08/31/20  Yes Lavera Guise, MD  aspirin EC 81 MG tablet Take 1 tablet (81 mg total) by mouth daily. Swallow whole. Patient taking differently: Take 81 mg by mouth in the morning and at bedtime. Swallow whole. 09/26/20 09/26/21 Yes Nita Sells, MD  budesonide-formoterol (SYMBICORT) 160-4.5 MCG/ACT inhaler Inhale 2 puffs into the lungs 2 (two) times daily. 07/20/20  Yes Lavera Guise, MD  carvedilol (COREG) 6.25 MG tablet Take 1 tablet (6.25 mg total) by mouth 2 (two) times daily with a meal. 10/08/20  Yes Jennye Boroughs, MD  cetirizine (ZYRTEC) 10 MG tablet Take 10 mg by mouth daily.   Yes [provider]  escitalopram (LEXAPRO) 10 MG tablet Take one tab po qd for anxiety with food Patient taking differently: Take 10 mg by mouth daily. Take one tab po qd for anxiety with food 07/17/20  Yes Lavera Guise, MD  ferrous gluconate (FERGON) 324 MG tablet Take 1 tablet (324 mg total) by mouth daily with breakfast. 08/01/19  Yes Boscia, Heather E, NP  furosemide (LASIX) 40 MG tablet Take one to tabs once or twice days for fluid //  swelling 07/17/20  Yes Lavera Guise, MD  gabapentin (NEURONTIN) 100 MG capsule Take 1 capsule (100 mg total) by mouth in the morning, at noon, and at bedtime. 07/17/20  Yes Lavera Guise, MD  glimepiride (AMARYL) 2 MG tablet Take one tab po qd with supper 07/17/20  Yes Lavera Guise, MD  hydrALAZINE (APRESOLINE) 100 MG tablet Take 1 tablet (100 mg total) by mouth 3 (three) times daily. 07/17/20  Yes Lavera Guise, MD  insulin glargine, 1 Unit Dial, (TOUJEO) 300 UNIT/ML Solostar Pen Inject 26 Units into the skin daily. 04/30/20  Yes Boscia, Heather E, NP  ipratropium-albuterol (DUONEB) 0.5-2.5 (3) MG/3ML SOLN Take 3 mLs by nebulization every 6 (six) hours as needed. 10/11/20  Yes Lavera Guise, MD  isosorbide mononitrate (IMDUR) 30 MG 24 hr  tablet Take 1 tablet (30 mg total) by mouth 2 (two) times daily. 10/08/20  Yes Jennye Boroughs, MD  mupirocin ointment (BACTROBAN) 2 % Apply 1 application topically daily. Qd to excision site 10/06/20  Yes Ralene Bathe, MD  omeprazole (PRILOSEC) 40 MG capsule Take 1 capsule (40 mg total) by mouth in the morning and at bedtime. 07/17/20  Yes Lavera Guise, MD  potassium chloride SA (KLOR-CON) 20 MEQ tablet Take 1 tablet (20 mEq total) by mouth 2 (two) times daily. 07/17/20  Yes Lavera Guise, MD  tiotropium (SPIRIVA HANDIHALER) 18 MCG inhalation capsule Place 1 capsule (18 mcg total) into inhaler and inhale daily. 06/02/20 08/31/20 Yes [name], NP  Blood Glucose Calibration (TRUE METRIX LEVEL 1) Low SOLN Use as directed 04/04/18   [name], NP  Blood Glucose Monitoring Suppl (TRUE METRIX AIR GLUCOSE METER) DEVI 1 Device by Does not apply route 2 (two) times daily. 04/13/18   [name], NP  glucose blood (TRUE METRIX BLOOD GLUCOSE TEST) test strip Use as instructed twice daily diag E11.65 07/30/19   [name], NP  NOVOFINE PEN NEEDLE 32G X 6 MM MISC Use as directed 10/15/20   Lavera Guise, MD  TRUEplus Lancets 33G MISC Use as directed twice daily diag E11.65 07/30/19   [name], NP    Inpatient Medications: Scheduled Meds: . aspirin EC  81 mg Oral Daily  . carvedilol  6.25 mg Oral BID WC  . enoxaparin (LOVENOX) injection  0.5 mg/kg Subcutaneous Q24H  . escitalopram  10 mg Oral Daily  . ferrous gluconate  324 mg Oral Q breakfast  . furosemide  40 mg Oral Daily  . gabapentin  100 mg Oral TID  . hydrALAZINE  100 mg Oral TID  . insulin aspart  0-15 Units Subcutaneous TID WC  . isosorbide mononitrate  30 mg Oral BID  . loratadine  10 mg Oral Daily  . mometasone-formoterol  2 puff Inhalation BID  . mupirocin ointment  1 application Topical Daily  . oseltamivir  75 mg Oral BID  . pantoprazole  40 mg Oral Daily  . potassium chloride SA  20 mEq Oral BID  .  sodium chloride flush  3 mL Intravenous Q12H  . tiotropium  18 mcg Inhalation Daily   Continuous Infusions: . sodium chloride     PRN Meds: sodium chloride, acetaminophen **OR** acetaminophen, albuterol, ondansetron **OR** ondansetron (ZOFRAN) IV, sodium chloride flush  Allergies:    Allergies  Allergen Reactions  . Penicillins Rash    Social History:   Social History   Socioeconomic History  . Marital status: Single    Spouse  name: Not on file  . Number of children: Not on file  . Years of education: Not on file  . Highest education level: Not on file  Occupational History  . Not on file  Tobacco Use  . Smoking status: Former Smoker    Types: Cigarettes  . Smokeless tobacco: Former Systems developer    Types: Secondary school teacher  . Vaping Use: Never used  Substance and Sexual Activity  . Alcohol use: No  . Drug use: No  . Sexual activity: Not on file  Other Topics Concern  . Not on file  Social History Narrative  . Not on file   Social Determinants of Health   Financial Resource Strain: Not on file  Food Insecurity: Not on file  Transportation Needs: Not on file  Physical Activity: Not on file  Stress: Not on file  Social Connections: Not on file  Intimate Partner Violence: Not on file    Family History:    Family History  Problem Relation Age of Onset  . Diabetes Mother        2003  . Lung cancer Father   . Diabetes Sister   . Hypertension Sister   . Heart disease Sister   . Cancer Sister   . Bone cancer Brother      ROS:  Please see the history of present illness.   All other ROS reviewed and negative.     Physical Exam/Data:   Vitals:   10/30/20 1700 10/30/20 1752 10/30/20 2056 10/31/20 0337  BP:  (!) 163/76 (!) 145/74 (!) 120/57  Pulse:  66 74 60  Resp:  _0 Temp:  (!) 100.9 F (38.3 C) 98.9 F (37.2 C) 98.1 F (36.7 C)  TempSrc:   Oral   SpO2: (S) 92% 96% 95% 93%  Weight:      Height:        Intake/Output Summary (Last 24 hours) at  10/31/2020 0723 Last data filed at 10/31/2020 0404 Gross per 24 hour  Intake 360 ml  Output 400 ml  Net -40 ml   Last 3 Weights 10/30/2020 10/30/2020 10/15/2020  Weight (lbs) 244 lb 6 oz 244 lb 6 oz 260 lb  Weight (kg) 110.848 kg 110.848 kg 117.935 kg  Some encounter information is confidential and restricted. Go to Review Flowsheets activity to see all data.     Body mass index is 34.08 kg/m.  General:  Well nourished, well developed, in no acute distress HEENT: normal Lymph: no adenopathy Neck: no JVD Endocrine:  No thryomegaly Vascular: No carotid bruits; FA pulses 2+ bilaterally without bruits  Cardiac:  normal S1, S2; RRR; no murmur  Lungs:  clear to auscultation bilaterally, no wheezing, rhonchi or rales  Abd: soft, nontender, no hepatomegaly  Ext: no edema Musculoskeletal:  No deformities, BUE and BLE strength normal and equal Skin: warm and dry  Neuro:  CNs 2-12 intact, no focal abnormalities noted Psych:  Normal affect   EKG:  The EKG was personally reviewed and demonstrates:   Telemetry:  Telemetry was personally reviewed and demonstrates:  NSr, HR 80s, PVcs  Relevant CV Studies:   Lexiscan MPI 10/08/2020: Pharmacological myocardial perfusion imaging study with no significant ischemia Large perfusion defect inferior wall, mildly better with stress than rest, images concerning for diaphragmatic attenuation artifact though unable to exclude old inferior MI  CT attenuation correction images not available secondary to patient movement GI uptake artifact noted.  Hypokinesis inferoseptal wall, EF estimated at 38% (estimation of  ejection fraction likely erroneous in the setting of GI uptake artifact) No EKG changes concerning for ischemia at peak stress or in recovery. Resting EKG with diffuse T wave abnormality, rare PVC Prior CT scan dated August 2021 with coronary calcification most notably in the LAD Moderate risk scan given depressed ejection fraction __________  2D  echo 09/26/2020: 1. Left ventricular ejection fraction, by estimation, is 35 to 40%. The  left ventricle has moderately decreased function. The left ventricle  demonstrates global hypokinesis. The left ventricular internal cavity size  was mildly dilated. Left ventricular  diastolic parameters were normal.  2. Right ventricular systolic function is normal. The right ventricular  size is normal.  3. The mitral valve is normal in structure. Mild mitral valve  regurgitation.  4. The aortic valve is normal in structure. Aortic valve regurgitation is  not visualized. __________  2D echo 04/27/2019: 1. Left ventricular ejection fraction, by visual estimation, is 45 to  50%. The left ventricle has mildly decreased function. There is mildly  increased left ventricular hypertrophy.  2. Left ventricular diastolic parameters are indeterminate.  3. Global right ventricle has normal systolic function.The right  ventricular size is normal. No increase in right ventricular wall  thickness.  4. Left atrial size was moderately dilated.  5. Right atrial size was normal.  6. The mitral valve is normal in structure. No evidence of mitral valve  regurgitation. No evidence of mitral stenosis.  7. The tricuspid valve is normal in structure. Tricuspid valve  regurgitation is not demonstrated.  8. The aortic valve is normal in structure. Aortic valve regurgitation is  not visualized. Mild to moderate aortic valve sclerosis/calcification  without any evidence of aortic stenosis.  9. The pulmonic valve was normal in structure. Pulmonic valve  regurgitation is not visualized.  10. TR signal is inadequate for assessing pulmonary artery systolic  pressure.  11. The inferior vena cava is normal in size with greater than 50%  respiratory variability, suggesting right atrial pressure of 3 mmHg. __________  RHC 03/29/2019: Pulmonary capillary wedge pressure mean of 8 PA: 32/16 mean 23 RA mean of  10 RV 26/12/13   Cardiac output 8.17 Cardiac index 3.42  Final Conclusions:  Normal right heart pressures  Recommendations:  Etiology of his shortness of breath likely multifactorial He is at euvolemic state at this time, no aggressive diuresis needed Needs referral to pulmonary for COPD work-up, long smoking history He may benefit from lung Works, exercise as needed sedentary lifestyle and is deconditioned -Recommended weight loss, lifestyle modification given obesity contributing to his shortness of breath symptoms __________  2D echo 03/03/2019: 1. Left ventricular ejection fraction, by visual estimation, is 55 to  60%. The left ventricle has normal function. Normal left ventricular size.  Left ventricular septal wall thickness was mildly increased. Mildly  increased left ventricular posterior  wall thickness. There is mildly increased left ventricular hypertrophy.  2. Elevated left ventricular end-diastolic pressure.  3. Left ventricular diastolic Doppler parameters are consistent with  pseudonormalization pattern of LV diastolic filling.  4. Global right ventricle has normal systolic function.The right  ventricular size is normal. No increase in right ventricular wall  thickness.  5. Left atrial size was severely dilated.  6. Right atrial size was normal.  7. The mitral valve is normal in structure. Trace mitral valve  regurgitation. No evidence of mitral stenosis.  8. The tricuspid valve is normal in structure. Tricuspid valve  regurgitation is trivial.  9. The aortic valve is normal  in structure. Aortic valve regurgitation  was not visualized by color flow Doppler. Mild aortic valve sclerosis  without stenosis.  10. The pulmonic valve was normal in structure. Pulmonic valve  regurgitation is trivial by color flow Doppler.  11. Moderately elevated pulmonary artery systolic pressure.  12. The inferior vena cava is dilated in size with >50% respiratory   variability, suggesting right atrial pressure of 8 mmHg.   Laboratory Data:  High Sensitivity Troponin:   Recent Labs  Lab 10/15/20 1852 10/30/20 1045 10/30/20 1305 10/30/20 1810 10/30/20 1912  TROPONINIHS 19* 76* 103* 98* 92*     Chemistry Recent Labs  Lab 10/30/20 1045 10/31/20 0444  NA 132* 134*  K 4.3 4.9  CL 106 108  CO2 17* 20*  GLUCOSE 114* 241*  BUN 36* 39*  CREATININE 2.47* 2.67*  CALCIUM 8.3* 8.2*  GFRNONAA 28* 25*  ANIONGAP 9 6    No results for input(s): PROT, ALBUMIN, AST, ALT, ALKPHOS, BILITOT in the last 168 hours. Hematology Recent Labs  Lab 10/30/20 1045 10/31/20 0444  WBC 4.5 2.9*  RBC 3.79* 3.58*  HGB 10.9* 10.1*  HCT 33.0* 31.5*  MCV 87.1 88.0  MCH 28.8 28.2  MCHC 33.0 32.1  RDW 14.0 13.6  PLT 197 164   BNPNo results for input(s): BNP, PROBNP in the last 168 hours.  DDimer No results for input(s): DDIMER in the last 168 hours.   Radiology/Studies:  DG Chest 2 View  Result Date: 10/30/2020 CLINICAL DATA:  Chest pain and shortness of breath.  Hematemesis. EXAM: CHEST - 2 VIEW COMPARISON:  11/04/2020 FINDINGS: Heart size upper limits of normal. Atherosclerosis and tortuosity of the aorta. Pulmonary venous hypertension without frank edema. No infiltrate, collapse or effusion. Ordinary degenerative changes affect the spine. IMPRESSION: Pulmonary venous hypertension pattern but without evidence of frank edema. No infiltrate, collapse or effusion. Aortic atherosclerosis. Electronically Signed   By: Nelson Chimes M.D.   On: 10/30/2020 11:26   CT CHEST ABDOMEN PELVIS WO CONTRAST  Result Date: 10/30/2020 CLINICAL DATA:  65 year old male with history of hematemesis and hemoptysis. Substernal chest pain and cough. Shortness of breath. EXAM: CT CHEST, ABDOMEN AND PELVIS WITHOUT CONTRAST TECHNIQUE: Multidetector CT imaging of the chest, abdomen and pelvis was performed following the standard protocol without IV contrast. COMPARISON:  CTA of the chest  01/24/2020. CT the abdomen and pelvis 12/10/2019. FINDINGS: CT CHEST FINDINGS Cardiovascular: Heart size is normal. There is no significant pericardial fluid, thickening or pericardial calcification. There is aortic atherosclerosis, as well as atherosclerosis of the great vessels of the mediastinum and the coronary arteries, including calcified atherosclerotic plaque in the left main, left anterior descending and left circumflex coronary arteries. Mediastinum/Nodes: No pathologically enlarged mediastinal or hilar lymph nodes. Please note that accurate exclusion of hilar adenopathy is limited on noncontrast CT scans. Esophagus is unremarkable in appearance. No axillary lymphadenopathy. Lungs/Pleura: No suspicious appearing pulmonary nodules or masses are noted. No acute consolidative airspace disease. No pleural effusions. Musculoskeletal: There are no aggressive appearing lytic or blastic lesions noted in the visualized portions of the skeleton. CT ABDOMEN PELVIS FINDINGS Hepatobiliary: No definite suspicious cystic or solid hepatic lesions are confidently identified on today's noncontrast CT examination. Status post cholecystectomy. Pancreas: No definite pancreatic mass or peripancreatic fluid collections or inflammatory changes noted on today's noncontrast CT examination. Spleen: Multiple calcified granulomas scattered throughout the spleen. Adrenals/Urinary Tract: Status post left radical nephrectomy. Multiple right renal lesions, incompletely characterized on today's non-contrast CT examination, but statistically likely to  represent cysts, largest of which measures 7.1 cm in the posterior aspect of the interpolar region of the right kidney. Additional complex lesion in the lower pole of the right kidney which demonstrates some internal high attenuation measuring 2.9 cm in diameter (coronal image one hundred eight of series 5). Right adrenal gland is normal. 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 No hydroureteronephrosis. Urinary bladder is normal in appearance. Stomach/Bowel: The appearance of the stomach is normal. There is no pathologic dilatation of small bowel or colon. Numerous colonic diverticulae are noted, particularly in the sigmoid colon, without surrounding phlegm a tori changes to suggest an acute diverticulitis at this time. Normal appendix. Vascular/Lymphatic: Aortic atherosclerosis. No lymphadenopathy noted in the abdomen or pelvis. Reproductive: Prostate gland and seminal vesicles are unremarkable in appearance. Other: Large umbilical hernia containing only omental fat. No significant volume of ascites. No pneumoperitoneum. Musculoskeletal: There are no aggressive appearing lytic or blastic lesions noted in the visualized portions of the skeleton. IMPRESSION: 1. No acute findings are noted in the chest, abdomen or pelvis to account for the patient's hematemesis or hemoptysis. 2. Slowly growing left adrenal lesion which may simply represent a mildly complex adrenal cyst, however, the possibility of a slowly growing adrenal neoplasm warrants consideration. Further evaluation with nonemergent adrenal protocol CT scan is recommended in the near future to provide more definitive characterization. 3. At the time of follow-up adrenal protocol CT, attention to the indeterminate 2.9 cm lesion in the lower pole of the right kidney is recommended to exclude neoplasm. 4. Colonic diverticulosis without evidence of acute diverticulitis at this time. 5. Large umbilical hernia containing only omental fat. No associated bowel incarceration or obstruction at this time. 6. Aortic atherosclerosis, in  addition to left main and 2 vessel coronary artery disease. Please note that although the presence of coronary artery calcium documents the presence of coronary artery disease, the severity of this disease and any potential stenosis cannot be assessed on this non-gated CT examination. Assessment for potential risk factor modification, dietary therapy or pharmacologic therapy may be warranted, if clinically indicated. 7. Additional incidental findings, as above. Electronically Signed   By: Vinnie Langton M.D.   On: 10/30/2020 15:15     Assessment and Plan:   Influenza A H/o COPD and prior tobacco use -Tamiflu per internal medicine  Pleuritic chest pain H/o nonobstructive CAD -Pleuritic chest pain in the settin gof influenza A.  Troponin mildly elevated with flat trend. EKG without acute changes. - History of nonobstructive CAD by heart cath in 2011 -  [place] MPI 09/30/2020 showed no significant ischemia, large perfusion defect involving the inferior wall that was mildly better with stress than rest, images concerning for diaphragmatic attenuation artifact, though unable to exclude old inferior MI.  Overall this was read as moderate risk scan given depressed EF. - Chest CTA showed two-vessel CAD and arctic atherosclerosis -Recommend recovering from Flu before any further ischemic evaluation, can be as OP.  Suspect mostly demand ischemia. - continue Aspirin, coreg, Imdur  HFrEF NICM EF 35-40% - Most recent echo showed LVEF 35-40%, global hypokinesis, normal diastolic parameters, normal RV size, mild MR.  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 Recent stress test as above -  Euvolemic on exam - continue home meds  Hypertension - mildly elevated - coreg 6.68m BID, hydralazine 1049mTID, Imdur 3043mID - can titrate Imdur  CKD stage III -Baseline around 2 -Followed by nephrology as OP  Obesity with OSA - continue CPAP  For questions or updates, please contact [email or web address] Please  consult [email or web address] for contact info under    Signed, Keandrea Tapley H FNinfa MeekerA-C  10/31/2020 7:24 AM

## 2020-10-31 NOTE — ED Provider Notes (Signed)
[facility] Emergency Department Provider Note ____________________________________________   Event Date/Time   First MD Initiated Contact with Patient 10/31/20 1857     (approximate)  I have reviewed the triage vital signs and the nursing notes.   HISTORY  Chief Complaint Shortness of Breath  HPI Larry Callahan is a 65 y.o. male with history of CHF, CAD, diabetes, demand ischemia, CVA, GERD, obesity, COPD with a recent history of influenza A presents to the emergency department for treatment and evaluation of shortness of breath.  He was admitted to the hospital yesterday and discharged today.  One of the customer service representatives from [facility] health called to see if he was satisfied with his visit and when she heard him wheezing advised him to come back to the emergency department.  He states that she actually called EMS herself to come pick him up.  He does tell me that if she had not called he would not have return to the emergency department tonight.  He denies chest pain.  He does state that he has shortness of breath and wheezing.  He was not able to pick up the medication prescribed because his pharmacy will be closed this weekend.         Past Medical History:  Diagnosis Date  . Allergy   . Coronary artery disease   . Diabetes mellitus without complication ([place])   . Diastolic CHF ([place])   . GERD (gastroesophageal reflux disease)   . Hyperlipidemia   . Hypertension   . Renal insufficiency   . Sleep apnea     Patient Active Problem List   Diagnosis Date Noted  . Demand ischemia ([place])   . Influenza A 10/30/2020  . Chest pain 10/06/2020  . HLD (hyperlipidemia) 10/06/2020  . CVA (cerebral vascular accident) ([place]) 09/25/2020  . Gastroesophageal reflux disease without esophagitis 04/19/2020  . Pneumonia due to COVID-19 virus 01/30/2020  . Elevated troponin I level 01/29/2020  . Suspected COVID-19 virus infection 01/29/2020  .  Postural dizziness with presyncope 01/29/2020  . Hematemesis 01/09/2020  . Hospital discharge follow-up 11/26/2019  . Cellulitis of left hand 09/26/2019  . Generalized weakness 08/08/2019  . Obesity (BMI 30-39.9) 08/08/2019  . Acute right hip pain 06/17/2019  . Inability to ambulate due to hip 06/17/2019  . CKD stage 3 due to type 2 diabetes mellitus ([place])   . Hypervolemia   . Acute on chronic diastolic CHF (congestive heart failure) ([place])   . Type 2 diabetes mellitus with stage 3b chronic kidney disease, with long-term current use of insulin ([employer])   . Hypomagnesemia   . Full code status 05/20/2019  . Pleuritic chest pain   . Multifocal pneumonia 05/19/2019  . Acute kidney injury superimposed on CKD 3b ([place]) 05/09/2019  . Hemoptysis 04/09/2019  . Shortness of breath 03/26/2019  . Uncontrolled type 2 diabetes mellitus with insulin therapy ([place]) 03/12/2019  . Acute on chronic heart failure with preserved ejection fraction (HFpEF) ([place])   . Chronic kidney disease, stage 3b ([place])   . Chronic systolic CHF (congestive heart failure) ([place]) 03/01/2019  . Diastolic dysfunction 92/42/6834  . Iron deficiency anemia 12/31/2018  . Atopic dermatitis 11/24/2018  . Screening for colon cancer 11/06/2017  . Uncontrolled type 2 diabetes mellitus with hyperglycemia ([place]) 11/06/2017  . Hidradenitis 06/09/2017  . Atherosclerotic heart disease of native coronary artery without angina pectoris 06/09/2017  . 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 Obstructive sleep apnea, adult 06/09/2017  .  Morbid (severe) obesity due to excess calories ([place]) 06/09/2017  . Cigarette nicotine dependence with nicotine-induced disorder 06/09/2017  . Diabetic polyneuropathy associated with type 2 diabetes mellitus ([place]) 06/09/2017  . Allergic rhinitis due to pollen 06/09/2017  . Mixed hyperlipidemia 06/09/2017  . COPD (chronic obstructive pulmonary disease) ([place]) 06/09/2017  . Dyspnea on  exertion 06/09/2017  . Wheezing 06/09/2017  . Dysuria 06/09/2017  . Snoring 06/09/2017  . Essential hypertension 06/09/2017  . Personal history of other malignant neoplasm of kidney 06/09/2017  . Pain in right hip 06/09/2017  . Impacted cerumen, bilateral 06/09/2017  . Secondary hyperparathyroidism, not elsewhere classified ([place]) 06/09/2017  . Type 2 diabetes mellitus with hyperglycemia ([place]) 06/09/2017  . Tinea corporis 06/09/2017    Past Surgical History:  Procedure Laterality Date  . BACK SURGERY    . CARDIAC CATHETERIZATION    . CHOLECYSTECTOMY    . COLONOSCOPY WITH PROPOFOL N/A 04/12/2019   Procedure: COLONOSCOPY WITH PROPOFOL;  Surgeon: [name], MD;  Location[facility] ENDOSCOPY;  Service: Gastroenterology;  Laterality: N/A;  . ESOPHAGOGASTRODUODENOSCOPY N/A 04/12/2019   Procedure: ESOPHAGOGASTRODUODENOSCOPY (EGD);  Surgeon: [name], MD;  Location: [facility] ENDOSCOPY;  Service: Gastroenterology;  Laterality: N/A;  . ESOPHAGOGASTRODUODENOSCOPY (EGD) WITH PROPOFOL N/A 11/21/2019   Procedure: ESOPHAGOGASTRODUODENOSCOPY (EGD) WITH PROPOFOL;  Surgeon: [name], MD;  Location: [facility] ENDOSCOPY;  Service: Gastroenterology;  Laterality: N/A;  . FLEXIBLE BRONCHOSCOPY Bilateral 05/17/2019   Procedure: FLEXIBLE BRONCHOSCOPY;  Surgeon: [name], MD;  Location: [name] ORS;  Service: Pulmonary;  Laterality: Bilateral;  . left arm surgery    . nephrectomy Left   . PARATHYROIDECTOMY    . RIGHT HEART CATH N/A 03/29/2019   Procedure: RIGHT HEART CATH;  Surgeon: [name], MD;  Location: [facility] CV LAB;  Service: Cardiovascular;  Laterality: N/A;    Prior to Admission medications   Medication Sig Start Date End Date Taking? Authorizing Provider  albuterol (VENTOLIN HFA) 108 (90 Base) MCG/ACT inhaler Inhale 2 puffs into the lungs every 6 (six) hours as needed for wheezing or shortness of breath. 08/31/20   Lavera Guise, MD  aspirin EC 81 MG EC tablet  Take 1 tablet (81 mg total) by mouth daily. Swallow whole. 11/01/20   Lorella Nimrod, MD  Blood Glucose Calibration (TRUE METRIX LEVEL 1) Low SOLN Use as directed 04/04/18   [name], NP  Blood Glucose Monitoring Suppl (TRUE METRIX AIR GLUCOSE METER) DEVI 1 Device by Does not apply route 2 (two) times daily. 04/13/18   [name], NP  budesonide-formoterol (SYMBICORT) 160-4.5 MCG/ACT inhaler Inhale 2 puffs into the lungs 2 (two) times daily. 07/20/20   Lavera Guise, MD  carvedilol (COREG) 6.25 MG tablet Take 1 tablet (6.25 mg total) by mouth 2 (two) times daily with a meal. 10/08/20   Jennye Boroughs, MD  cetirizine (ZYRTEC) 10 MG tablet Take 10 mg by mouth daily.    [provider]  escitalopram (LEXAPRO) 10 MG tablet Take one tab po qd for anxiety with food Patient taking differently: Take 10 mg by mouth daily. Take one tab po qd for anxiety with food 07/17/20   Lavera Guise, MD  ferrous gluconate (FERGON) 324 MG tablet Take 1 tablet (324 mg total) by mouth daily with breakfast. 08/01/19   [name], NP  furosemide (LASIX) 40 MG tablet Take one to tabs once or twice days for fluid // swelling 07/17/20   Lavera Guise, MD  gabapentin (NEURONTIN) 100  MG capsule Take 1 capsule (100 mg total) by mouth in the morning, at noon, and at bedtime. 07/17/20   Lavera Guise, MD  glimepiride Jari Sportsman) 2 MG tablet Take one tab po qd with supper 07/17/20   Lavera Guise, MD  glucose blood (TRUE METRIX BLOOD GLUCOSE TEST) test strip Use as instructed twice daily diag E11.65 07/30/19   [name], NP  hydrALAZINE (APRESOLINE) 100 MG tablet Take 1 tablet (100 mg total) by mouth 3 (three) times daily. 07/17/20   Lavera Guise, MD  insulin glargine, 1 Unit Dial, (TOUJEO) 300 UNIT/ML Solostar Pen Inject 26 Units into the skin daily. 04/30/20   [name], NP  ipratropium-albuterol (DUONEB) 0.5-2.5 (3) MG/3ML SOLN Take 3 mLs by nebulization every 6 (six) hours as needed. 10/11/20   Lavera Guise, MD  isosorbide mononitrate (IMDUR) 30 MG 24 hr tablet Take 1 tablet (30 mg total) by mouth 2 (two) times daily. 10/08/20   Jennye Boroughs, MD  mupirocin ointment (BACTROBAN) 2 % Apply 1 application topically daily. Qd to excision site 10/06/20   Ralene Bathe, MD  NOVOFINE PEN NEEDLE 32G X 6 MM MISC Use as directed 10/15/20   Lavera Guise, MD  omeprazole (PRILOSEC) 40 MG capsule Take 1 capsule (40 mg total) by mouth in the morning and at bedtime. 07/17/20   Lavera Guise, MD  oseltamivir (TAMIFLU) 30 MG capsule Take 1 capsule (30 mg total) by mouth 2 (two) times daily for 5 days. 10/31/20 11/05/20  Lorella Nimrod, MD  potassium chloride SA (KLOR-CON) 20 MEQ tablet Take 1 tablet (20 mEq total) by mouth 2 (two) times daily. 07/17/20   Lavera Guise, MD  predniSONE (DELTASONE) 20 MG tablet Take 1 tablet (20 mg total) by mouth daily with breakfast for 4 days. 10/31/20 11/04/20  Lorella Nimrod, MD  tiotropium (SPIRIVA HANDIHALER) 18 MCG inhalation capsule Place 1 capsule (18 mcg total) into inhaler and inhale daily. 06/02/20 08/31/20  [name], NP  TRUEplus Lancets 33G MISC Use as directed twice daily diag E11.65 07/30/19   [name], NP    Allergies Penicillins  Family History  Problem Relation Age of Onset  . Diabetes Mother        2003  . Lung cancer Father   . Diabetes Sister   . Hypertension Sister   . Heart disease Sister   . Cancer Sister   . Bone cancer Brother     Social History Social History   Tobacco Use  . Smoking status: Former Smoker    Types: Cigarettes  . Smokeless tobacco: Former Systems developer    Types: Secondary school teacher  . Vaping Use: Never used  Substance Use Topics  . Alcohol use: No  . Drug use: No    Review of Systems  Constitutional: No fever/chills Eyes: No visual changes. ENT: No sore throat. Cardiovascular: Denies chest pain. Respiratory: Positive for shortness of breath and wheezing. Gastrointestinal: No abdominal pain.  No nausea, no vomiting.   No diarrhea.  No constipation. Genitourinary: Negative for dysuria. Musculoskeletal: Negative for back pain. Right chest wall pain with movement. Skin: Negative for rash. Neurological: Negative for headaches, focal weakness or numbness. ____________________________________________   PHYSICAL EXAM:  VITAL SIGNS: ED Triage Vitals  Enc Vitals Group     BP 10/31/20 1859 130/70     Pulse Rate 10/31/20 1859 84     Resp 10/31/20 1859 18     Temp 10/31/20 1859  98.9 F (37.2 C)     Temp Source 10/31/20 1859 Oral     SpO2 10/31/20 1859 96 %     Weight 10/31/20 1858 277 lb 12.5 oz (126 kg)     Height 10/31/20 1858 _0  (1.803 m)     Head Circumference --      Peak Flow --      Pain Score 10/31/20 1900 8     Pain Loc --      Pain Edu? --      Excl. in [place]? --     Constitutional: Alert and oriented.  Chronically ill appearing and in no acute distress. Eyes: Conjunctivae are normal.  Head: Atraumatic. Nose: No congestion/rhinnorhea. Mouth/Throat: Mucous membranes are moist.  Oropharynx non-erythematous. Neck: No stridor.   Hematological/Lymphatic/Immunilogical: No cervical lymphadenopathy. Cardiovascular: Normal rate, regular rhythm. Grossly normal heart sounds.  Good peripheral circulation. Respiratory: Normal respiratory effort.  No retractions.  Wheezing throughout Gastrointestinal: Soft and nontender. No distention. No abdominal bruits.  Genitourinary:  Musculoskeletal: No lower extremity tenderness nor edema.Focal tenderness over the right lateral chest wall with motion. Neurologic:  Normal speech and language. No gross focal neurologic deficits are appreciated. No gait instability. Skin:  Skin is warm, dry and intact. No rash noted. Psychiatric: Mood and affect are normal. Speech and behavior are normal.  ____________________________________________   LABS (all labs ordered are listed, but only abnormal results are displayed)  Labs Reviewed  CBC WITH DIFFERENTIAL/PLATELET  - Abnormal; Notable for the following components:      Result Value   RBC 3.85 (*)    Hemoglobin 11.0 (*)    HCT 33.5 (*)    All other components within normal limits  BASIC METABOLIC PANEL - Abnormal; Notable for the following components:   Sodium 134 (*)    CO2 19 (*)    Glucose, Bld 126 (*)    BUN 45 (*)    Creatinine, Ser 2.69 (*)    Calcium 8.2 (*)    GFR, Estimated 25 (*)    All other components within normal limits  TROPONIN I (HIGH SENSITIVITY) - Abnormal; Notable for the following components:   Troponin I (High Sensitivity) 68 (*)    All other components within normal limits  BRAIN NATRIURETIC PEPTIDE  TROPONIN I (HIGH SENSITIVITY)   ____________________________________________  EKG  Pending ____________________________________________  RADIOLOGY  ED MD interpretation:    Chest x-ray negative for acute findings.   I, Sherrie George, personally viewed and evaluated these images (plain radiographs) as part of my medical decision making, as well as reviewing the written report by the radiologist.  Official radiology report(s): DG Chest 2 View  Result Date: 10/31/2020 CLINICAL DATA:  Shortness of breath. EXAM: CHEST - 2 VIEW COMPARISON:  Oct 30, 2020. FINDINGS: The heart size and mediastinal contours are within normal limits. Both lungs are clear. The visualized skeletal structures are unremarkable. IMPRESSION: No active cardiopulmonary disease. Electronically Signed   By: Marijo Conception M.D.   On: 10/31/2020 19:47    ____________________________________________   PROCEDURES  Procedure(s) performed (including Critical Care):  Procedures  ____________________________________________   INITIAL IMPRESSION / ASSESSMENT AND PLAN    65 year old male presents to the emergency department for treatment and evaluation of wheezing and shortness of breath. Diagnosed with Influenza A yesterday. See HPI for further details. Plan will be to work up for shortness of  breath. Duoneb and solumedrol ordered.   DIFFERENTIAL DIAGNOSIS  Influenza A, COPD exacerbation, Pneumonia  ED  COURSE  Patient continues to have diffuse wheezing after 1 duoneb. Labs are otherwise unremarkable as is the chest x-ray. He would like to be admitted until his shortness of breath is better controlled.  Care signed out to Dr. Karma Greaser who will follow to disposition.    ___________________________________________   FINAL CLINICAL IMPRESSION(S) / ED DIAGNOSES  Final diagnoses:  COPD exacerbation [facility])     ED Discharge Orders    None       Kirt Chew Campoverde was evaluated in Emergency Department on 10/31/2020 for the symptoms described in the history of present illness. He was evaluated in the context of the global COVID-19 pandemic, which necessitated consideration that the patient might be at risk for infection with the SARS-CoV-2 virus that causes COVID-19. Institutional protocols and algorithms that pertain to the evaluation of patients at risk for COVID-19 are in a state of rapid change based on information released by regulatory bodies including the CDC and federal and state organizations. These policies and algorithms were followed during the patient's care in the ED.   Note:  This document was prepared using Dragon voice recognition software and may include unintentional dictation errors.   Victorino Dike, FNP 10/31/20 2111    Hinda Kehr, MD 10/31/20 Skip Mayer    Hinda Kehr, MD 11/01/20 [phone]

## 2020-10-31 NOTE — Discharge Summary (Signed)
Physician Discharge Summary  Larry Callahan DUK:[id number] DOB: 02/21/1956 DOA: 10/30/2020  PCP: Lavera Guise, MD  Admit date: 10/30/2020 Discharge date: 10/31/2020  Admitted From: Home Disposition: Home  Recommendations for Outpatient Follow-up:  1. Follow up with PCP in 1-2 weeks 2. Please obtain BMP/CBC in one week 3. Please follow up on the following pending results: None  Home Health: No Equipment/Devices: Home oxygen Discharge Condition: Stable  CODE STATUS: Full Diet recommendation: Heart Healthy / Carb Modified   Brief/Interim Summary:  Larry Callahan is a 65 y.o. 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 Patient was seen at the cardiologist office today and complained about having hematemesis, nosebleeds and rectal bleeding and so was sent to the ER for evaluation. In the ER Hemoccult was negative and patient has not had any episodes of hematemesis or nosebleeds since his stay in the ED. He complains of chest pain which is non radiating and not related to rest or exertion.  He describes it as a stabbing sensation mostly when he palpates his chest.   He denies having any fever or chills and denies having any sick contacts. He was found to have positive PCR for influenza A.  Chest x-ray with mild pulmonary venous hypertension pattern but without any frank edema.  No infiltrate.  CT chest, abdomen and pelvis was without any acute findings which can explain his complaint of hematemesis or hemoptysis.  Hemoglobin stable. CT abdomen did showed left adrenal lesion which may simply represent a mildly complex right renal cyst,  however, the possibility of a slowly growing adrenal neoplasm cannot be excluded and radiology is recommending a adrenal protocol CT scan for follow-up.  It also shows an indeterminate 2.9 cm lesion in the lower pole of right kidney which required kidney protocol CT. This imaging can be done as an outpatient by his primary care provider.  Patient did had elevated troponin which peaked at 103 and started trending down.  Most likely secondary to demand ischemia as he also has an history of HFrEF with EF of 35 to 40%.  Patient had a recent [place] which was read at moderate risk scan and medical management was recommended by his cardiologist.  Patient will follow-up with his cardiologist as an outpatient for further recommendations.  He will continue his home meds.  Patient was started on Tamiflu for 5 days for positive influenza A and a short course of steroid for a possible costochondritis as he was having atypical pain and chest wall tenderness. He was saturating well on his baseline oxygen requirement of 2 L.  He will continue with rest of his home medications and follow-up with his providers.  Discharge Diagnoses:  Principal Problem:   Chest pain Active Problems:   COPD (chronic obstructive pulmonary disease) (HCC)   Essential hypertension   Chronic systolic CHF (congestive heart failure) (HCC)   CKD stage 3 due to type 2 diabetes mellitus (HCC)   Obesity (BMI 30-39.9)   Elevated troponin I level   Influenza A   Demand ischemia [facility])   Discharge Instructions  Discharge Instructions    Diet - low sodium heart healthy   Complete by: As directed    Discharge  instructions   Complete by: As directed    It was pleasure taking care of you. As we discussed you have flu, please take Tamiflu for 5 days. You are also being given steroids for 4 more days. You can use your pain medication to help with this chest pain if needed. Keep yourself well-hydrated and follow-up with your primary care  provider.   Increase activity slowly   Complete by: As directed      Allergies as of 10/31/2020      Reactions   Penicillins Rash      Medication List    TAKE these medications   albuterol 108 (90 Base) MCG/ACT inhaler Commonly known as: VENTOLIN HFA Inhale 2 puffs into the lungs every 6 (six) hours as needed for wheezing or shortness of breath.   aspirin 81 MG EC tablet Take 1 tablet (81 mg total) by mouth daily. Swallow whole. Start taking on: Nov 01, 2020 What changed: when to take this   budesonide-formoterol 160-4.5 MCG/ACT inhaler Commonly known as: SYMBICORT Inhale 2 puffs into the lungs 2 (two) times daily.   carvedilol 6.25 MG tablet Commonly known as: COREG Take 1 tablet (6.25 mg total) by mouth 2 (two) times daily with a meal.   cetirizine 10 MG tablet Commonly known as: ZYRTEC Take 10 mg by mouth daily.   escitalopram 10 MG tablet Commonly known as: Lexapro Take one tab po qd for anxiety with food What changed:   how much to take  how to take this  when to take this   ferrous gluconate 324 MG tablet Commonly known as: FERGON Take 1 tablet (324 mg total) by mouth daily with breakfast.   furosemide 40 MG tablet Commonly known as: LASIX Take one to tabs once or twice days for fluid // swelling   gabapentin 100 MG capsule Commonly known as: NEURONTIN Take 1 capsule (100 mg total) by mouth in the morning, at noon, and at bedtime.   glimepiride 2 MG tablet Commonly known as: AMARYL Take one tab po qd with supper   hydrALAZINE 100 MG tablet Commonly known as: APRESOLINE Take 1 tablet (100 mg total) by mouth 3 (three) times daily.   insulin glargine (1 Unit Dial) 300 UNIT/ML Solostar Pen Commonly known as: TOUJEO Inject 26 Units into the skin daily.   ipratropium-albuterol 0.5-2.5 (3) MG/3ML Soln Commonly known as: DUONEB Take 3 mLs by nebulization every 6 (six) hours as needed.   isosorbide mononitrate 30 MG 24 hr tablet Commonly known as:  IMDUR Take 1 tablet (30 mg total) by mouth 2 (two) times daily.   mupirocin ointment 2 % Commonly known as: BACTROBAN Apply 1 application topically daily. Qd to excision site   Novofine Pen Needle 32G X 6 MM Misc Generic drug: Insulin Pen Needle Use as directed   omeprazole 40 MG capsule Commonly known as: PRILOSEC Take 1 capsule (40 mg total) by mouth in the morning and at bedtime.   oseltamivir 30 MG capsule Commonly known as: TAMIFLU Take 1 capsule (30 mg total) by mouth 2 (two) times daily for 5 days.   potassium chloride SA 20 MEQ tablet Commonly known as: KLOR-CON Take 1 tablet (20 mEq total) by mouth 2 (two) times daily.   predniSONE 20 MG tablet Commonly known as: DELTASONE Take 1 tablet (20 mg total) by mouth daily with breakfast for 4 days.   Spiriva HandiHaler 18 MCG inhalation capsule Generic drug: tiotropium Place 1 capsule (18 mcg total) into inhaler and inhale  daily.   True Metrix Air Glucose Meter Devi 1 Device by Does not apply route 2 (two) times daily.   True Metrix Blood Glucose Test test strip Generic drug: glucose blood Use as instructed twice daily diag E11.65   True Metrix Level 1 Low Soln Use as directed   TRUEplus Lancets 33G Misc Use as directed twice daily diag E11.65       Follow-up Information    Lavera Guise, MD. Schedule an appointment as soon as possible for a visit.   Specialties: Internal Medicine, Cardiology Contact information: [place] [address] [phone]        [name], MD .   Specialty: Cardiology Contact information: [place] [address] [phone]              Allergies  Allergen Reactions  . Penicillins Rash    Consultations:  None  Procedures/Studies: DG Chest 2 View  Result Date: 10/30/2020 CLINICAL DATA:  Chest pain and shortness of breath.  Hematemesis. EXAM: CHEST - 2 VIEW COMPARISON:  11/04/2020 FINDINGS: Heart size upper  limits of normal. Atherosclerosis and tortuosity of the aorta. Pulmonary venous hypertension without frank edema. No infiltrate, collapse or effusion. Ordinary degenerative changes affect the spine. IMPRESSION: Pulmonary venous hypertension pattern but without evidence of frank edema. No infiltrate, collapse or effusion. Aortic atherosclerosis. Electronically Signed   By: Nelson Chimes M.D.   On: 10/30/2020 11:26   NM Pulmonary Perfusion  Result Date: 10/16/2020 CLINICAL DATA:  Shortness of breath EXAM: NUCLEAR MEDICINE PERFUSION LUNG SCAN TECHNIQUE: Perfusion images were obtained in multiple projections after intravenous injection of radiopharmaceutical. Ventilation scans intentionally deferred if perfusion scan and chest x-ray adequate for interpretation during COVID 19 epidemic. RADIOPHARMACEUTICALS:  4.37 mCi Tc-72m MAA IV COMPARISON:  05/11/2019 perfusion study. Chest x-ray from earlier yesterday FINDINGS: No perfusion defects.  Stable perfusion when compared to prior. IMPRESSION: Stable lung perfusion.  Negative for pulmonary embolism. Electronically Signed   By: Monte Fantasia M.D.   On: 10/16/2020 11:33   NM Myocar Multi W/Spect W/Wall Motion / EF  Result Date: 10/08/2020 Pharmacological myocardial perfusion imaging study with no significant ischemia Large perfusion defect inferior wall, mildly better with stress than rest,  images concerning for diaphragmatic attenuation artifact though unable to exclude old inferior MI CT attenuation correction images not available secondary to patient movement GI uptake artifact noted. Hypokinesis inferoseptal wall, EF estimated at 38% (estimation of ejection fraction likely erroneous in the setting of GI uptake artifact) No EKG changes concerning for ischemia at peak stress or in recovery. Resting EKG with diffuse T wave abnormality, rare PVC Prior CT scan dated August 2021 with coronary calcification most notably in the LAD Moderate risk scan given depressed  ejection fraction Signed, Esmond Plants, MD, Ph.D [facility]   US Venous Img Lower Unilateral Right  Result Date: 10/15/2020 CLINICAL DATA:  Right leg pain. EXAM: RIGHT LOWER EXTREMITY VENOUS DOPPLER ULTRASOUND TECHNIQUE: Gray-scale sonography with compression, as well as color and duplex ultrasound, were performed to evaluate the deep venous system(s) from the level of the common femoral vein through the popliteal and proximal calf veins. COMPARISON:  None. FINDINGS: VENOUS Normal compressibility of the common femoral, superficial femoral, and popliteal veins, as well as the visualized calf veins. Visualized portions of profunda femoral vein and great saphenous vein unremarkable. No filling defects to suggest DVT on grayscale or color Doppler imaging. Doppler waveforms show normal direction of venous flow, normal  respiratory plasticity and response to augmentation. Limited views of the contralateral common femoral vein are unremarkable. OTHER Multiple prominent lymph nodes in the right groin, the largest measuring 1.5 by 0.8 x 1.3 cm. IMPRESSION: 1. No evidence of DVT. 2. Incidentally imaged nonspecific prominent right groin lymph nodes, the largest measuring 0.8 cm short axis. Recommend clinical follow-up. Electronically Signed   By: Margaretha Sheffield MD   On: 10/15/2020 17:30   DG Chest Port 1 View  Result Date: 10/15/2020 CLINICAL DATA:  Dyspnea EXAM: PORTABLE CHEST 1 VIEW COMPARISON:  10/06/2020 FINDINGS: Lung volumes are small, but are symmetric. While this contributes to apparent interstitial thickening, a superimposed infectious or inflammatory interstitial infiltrate is noted within the a left lung base. No pneumothorax or pleural effusion. Cardiac size within normal limits. No acute bone abnormality. IMPRESSION: Left basilar focal pulmonary infiltrate, likely infectious or inflammatory. Pulmonary hypoinflation. Electronically Signed   By: Fidela Salisbury MD   On: 10/15/2020 15:57   DG Chest  Portable 1 View  Result Date: 10/06/2020 CLINICAL DATA:  Chest pain EXAM: PORTABLE CHEST 1 VIEW COMPARISON:  September 18, 2020 FINDINGS: Interstitium is thickened without edema or consolidation evident. Heart is borderline enlarged with pulmonary vascularity normal. No adenopathy. No bone lesions. IMPRESSION: Relative interstitial thickening which may indicate a degree of underlying bronchitis. No edema or consolidation. Heart borderline enlarged. No adenopathy appreciable. Electronically Signed   By: Lowella Grip III M.D.   On: 10/06/2020 14:58   DG Femur Min 2 Views Right  Result Date: 10/15/2020 CLINICAL DATA:  Right upper thigh pain EXAM: RIGHT FEMUR 2 VIEWS COMPARISON:  06/17/2019, 04/29/2019 FINDINGS: No fracture or malalignment. Prominent osteophytes about the femoral head neck junction as before. Soft tissues are unremarkable. IMPRESSION: No acute osseous abnormality. Electronically Signed   By: Donavan Foil M.D.   On: 10/15/2020 23:34   CT CHEST ABDOMEN PELVIS WO CONTRAST  Result Date: 10/30/2020 CLINICAL DATA:  66 year old male with history of hematemesis and hemoptysis. Substernal chest pain and cough. Shortness of breath. EXAM: CT CHEST, ABDOMEN AND PELVIS WITHOUT CONTRAST TECHNIQUE: Multidetector CT imaging of the chest, abdomen and pelvis was performed following the standard protocol without IV contrast. COMPARISON:  CTA of the chest 01/24/2020. CT the abdomen and pelvis 12/10/2019. FINDINGS: CT CHEST FINDINGS Cardiovascular: Heart size is normal. There is no significant pericardial fluid, thickening or pericardial calcification. There is aortic atherosclerosis, as well as atherosclerosis of the great vessels of the mediastinum and the coronary arteries, including calcified atherosclerotic plaque in the left main, left anterior descending and left circumflex coronary arteries. Mediastinum/Nodes: No pathologically enlarged mediastinal or hilar lymph nodes. Please note that accurate exclusion  of hilar adenopathy is limited on noncontrast CT scans. Esophagus is unremarkable in appearance. No axillary lymphadenopathy. Lungs/Pleura: No suspicious appearing pulmonary nodules or masses are noted. No acute consolidative airspace disease. No pleural effusions. Musculoskeletal: There are no aggressive appearing lytic or blastic lesions noted in the visualized portions of the skeleton. CT ABDOMEN PELVIS FINDINGS Hepatobiliary: No definite suspicious cystic or solid hepatic lesions are confidently identified on today's noncontrast CT examination. Status post cholecystectomy. Pancreas: No definite pancreatic mass or peripancreatic fluid collections or inflammatory changes noted on today's noncontrast CT examination. Spleen: Multiple calcified granulomas scattered throughout the spleen. Adrenals/Urinary Tract: Status post left radical nephrectomy. Multiple right renal lesions, incompletely characterized on today's non-contrast CT examination, but statistically likely to represent cysts, largest of which measures 7.1 cm in the posterior aspect of the interpolar region of  the right kidney. Additional complex lesion in the lower pole of the right kidney which demonstrates some internal high attenuation measuring 2.9 cm in diameter (coronal image one hundred eight of series 5). Right adrenal gland is normal. 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 No hydroureteronephrosis. Urinary bladder is normal in appearance. Stomach/Bowel: The appearance of the stomach is normal. There is no pathologic dilatation of small bowel or colon. Numerous colonic diverticulae are noted, particularly in the sigmoid colon,  without surrounding phlegm a tori changes to suggest an acute diverticulitis at this time. Normal appendix. Vascular/Lymphatic: Aortic atherosclerosis. No lymphadenopathy noted in the abdomen or pelvis. Reproductive: Prostate gland and seminal vesicles are unremarkable in appearance. Other: Large umbilical hernia containing only omental fat. No significant volume of ascites. No pneumoperitoneum. Musculoskeletal: There are no aggressive appearing lytic or blastic lesions noted in the visualized portions of the skeleton. IMPRESSION: 1. No acute findings are noted in the chest, abdomen or pelvis to account for the patient's hematemesis or hemoptysis. 2. Slowly growing left adrenal lesion which may simply represent a mildly complex adrenal cyst, however, the possibility of a slowly growing adrenal neoplasm warrants consideration. Further evaluation with nonemergent adrenal protocol CT scan is recommended in the near future to provide more definitive characterization. 3. At the time of follow-up adrenal protocol CT, attention to the indeterminate 2.9 cm lesion in the lower pole of the right kidney is recommended to exclude neoplasm. 4. Colonic diverticulosis without evidence of acute diverticulitis at this time. 5. Large umbilical hernia containing only omental fat. No associated bowel incarceration or obstruction at this time. 6. Aortic atherosclerosis, in addition to left main and 2 vessel coronary artery disease. Please note that although the presence of coronary artery calcium documents the presence of coronary artery disease, the severity of this disease and any potential stenosis cannot be assessed on this non-gated CT examination. Assessment for potential risk factor modification, dietary therapy or pharmacologic therapy may be warranted, if clinically indicated. 7. Additional incidental findings, as above. Electronically Signed   By: Vinnie Langton M.D.   On: 10/30/2020 15:15    Subjective: Patient was seen  and examined today.Continue to have some substernal soreness and tenderness, no SOB.  Discharge Exam: Vitals:   10/31/20 0801 10/31/20 1136  BP: (!) 144/75 (!) 150/71  Pulse: 66 64  Resp: 18 18  Temp: 97.7 F (36.5 C) 97.9 F (36.6 C)  SpO2: 96% 97%   Vitals:   10/30/20 2056 10/31/20 0337 10/31/20 0801 10/31/20 1136  BP: (!) 145/74 (!) 120/57 (!) 144/75 (!) 150/71  Pulse: 74 60 66 64  Resp: 20 20 18 18   Temp: 98.9 F (37.2 C) 98.1 F (36.7 C) 97.7 F (36.5 C) 97.9 F (36.6 C)  TempSrc: Oral     SpO2: 95% 93% 96% 97%  Weight:   122.5 kg   Height:        General: Pt is alert, awake, not in acute distress Cardiovascular: RRR, S1/S2 +, no rubs, no gallops Respiratory: CTA bilaterally, no wheezing, no rhonchi Abdominal: Soft, NT, ND, bowel sounds + Extremities: no edema, no cyanosis   The results of significant diagnostics from this hospitalization (including imaging, microbiology, ancillary and laboratory)  are listed below for reference.    Microbiology: Recent Results (from the past 240 hour(s))  Resp Panel by RT-PCR (Flu A&B, Covid) Nasopharyngeal Swab     Status: Abnormal   Collection Time: 10/30/20  3:25 PM   Specimen: Nasopharyngeal Swab; Nasopharyngeal(NP) swabs in vial transport medium  Result Value Ref Range Status   SARS Coronavirus 2 by RT PCR NEGATIVE NEGATIVE Final    Comment: (NOTE) SARS-CoV-2 target nucleic acids are NOT DETECTED.  The SARS-CoV-2 RNA is generally detectable in upper respiratory specimens during the acute phase of infection. The lowest concentration of SARS-CoV-2 viral copies this assay can detect is 138 copies/mL. A negative result does not preclude SARS-Cov-2 infection and should not be used as the sole basis for treatment or other patient management decisions. A negative result may occur with  improper specimen collection/handling, submission of specimen other than nasopharyngeal swab, presence of viral mutation(s) within the areas  targeted by this assay, and inadequate number of viral copies(<138 copies/mL). A negative result must be combined with clinical observations, patient history, and epidemiological information. The expected result is Negative.  Fact Sheet for Patients:  EntrepreneurPulse.com.au  Fact Sheet for Healthcare Providers:  IncredibleEmployment.be  This test is no t yet approved or cleared by the [place] FDA and  has been authorized for detection and/or diagnosis of SARS-CoV-2 by FDA under an Emergency Use Authorization (EUA). This EUA will remain  in effect (meaning this test can be used) for the duration of the COVID-19 declaration under Section 564(b)(1) of the Act, 21 U.S.C.section 360bbb-3(b)(1), unless the authorization is terminated  or revoked sooner.       Influenza A by PCR POSITIVE (A) NEGATIVE Final   Influenza B by PCR NEGATIVE NEGATIVE Final    Comment: (NOTE) The Xpert Xpress SARS-CoV-2/FLU/RSV plus assay is intended as an aid in the diagnosis of influenza from Nasopharyngeal swab specimens and should not be used as a sole basis for treatment. Nasal washings and aspirates are unacceptable for Xpert Xpress SARS-CoV-2/FLU/RSV testing.  Fact Sheet for Patients: EntrepreneurPulse.com.au  Fact Sheet for Healthcare Providers: IncredibleEmployment.be  This test is not yet approved or cleared by the [place] FDA and has been authorized for detection and/or diagnosis of SARS-CoV-2 by FDA under an Emergency Use Authorization (EUA). This EUA will remain in effect (meaning this test can be used) for the duration of the COVID-19 declaration under Section 564(b)(1) of the Act, 21 U.S.C. section 360bbb-3(b)(1), unless the authorization is terminated or revoked.  Performed at [facility], [address] [place], [place] [address]      Labs: BNP (last 3 results) Recent Labs     01/24/20 1431 07/20/20 1652 10/06/20 1428  BNP 94.5 84.6 18.5   Basic Metabolic Panel: Recent Labs  Lab 10/30/20 1045 10/31/20 0444  NA 132* 134*  K 4.3 4.9  CL 106 108  CO2 17* 20*  GLUCOSE 114* 241*  BUN 36* 39*  CREATININE 2.47* 2.67*  CALCIUM 8.3* 8.2*   Liver Function Tests: No results for input(s): AST, ALT, ALKPHOS, BILITOT, PROT, ALBUMIN in the last 168 hours. No results for input(s): LIPASE, AMYLASE in the last 168 hours. No results for input(s): AMMONIA in the last 168 hours. CBC: Recent Labs  Lab 10/30/20 1045 10/31/20 0444  WBC 4.5 2.9*  HGB 10.9* 10.1*  HCT 33.0* 31.5*  MCV 87.1 88.0  PLT 197 164   Cardiac Enzymes: No results for input(s): CKTOTAL, CKMB, CKMBINDEX, TROPONINI in the last 168 hours.  BNP: Invalid input(s): POCBNP CBG: Recent Labs  Lab 10/30/20 2102 10/31/20 0758 10/31/20 1158  GLUCAP 173* 230* 222*   D-Dimer No results for input(s): DDIMER in the last 72 hours. Hgb A1c No results for input(s): HGBA1C in the last 72 hours. Lipid Profile No results for input(s): CHOL, HDL, LDLCALC, TRIG, CHOLHDL, LDLDIRECT in the last 72 hours. Thyroid function studies No results for input(s): TSH, T4TOTAL, T3FREE, THYROIDAB in the last 72 hours.  Invalid input(s): FREET3 Anemia work up No results for input(s): VITAMINB12, FOLATE, FERRITIN, TIBC, IRON, RETICCTPCT in the last 72 hours. Urinalysis    Component Value Date/Time   COLORURINE YELLOW (A) 09/25/2020 1643   APPEARANCEUR HAZY (A) 09/25/2020 1643   APPEARANCEUR Cloudy (A) 11/23/2018 1103   LABSPEC 1.012 09/25/2020 1643   LABSPEC 1.005 03/06/2013 0929   PHURINE 5.0 09/25/2020 1643   GLUCOSEU 150 (A) 09/25/2020 1643   GLUCOSEU Negative 03/06/2013 0929   HGBUR NEGATIVE 09/25/2020 1643   BILIRUBINUR NEGATIVE 09/25/2020 1643   BILIRUBINUR negative 02/20/2019 1404   BILIRUBINUR Negative 11/23/2018 1103   BILIRUBINUR Negative 03/06/2013 0929   KETONESUR NEGATIVE 09/25/2020 1643    PROTEINUR >=300 (A) 09/25/2020 1643   UROBILINOGEN 0.2 02/20/2019 1404   NITRITE NEGATIVE 09/25/2020 1643   LEUKOCYTESUR NEGATIVE 09/25/2020 1643   LEUKOCYTESUR Negative 03/06/2013 0929   Sepsis Labs Invalid input(s): PROCALCITONIN,  WBC,  LACTICIDVEN Microbiology Recent Results (from the past 240 hour(s))  Resp Panel by RT-PCR (Flu A&B, Covid) Nasopharyngeal Swab     Status: Abnormal   Collection Time: 10/30/20  3:25 PM   Specimen: Nasopharyngeal Swab; Nasopharyngeal(NP) swabs in vial transport medium  Result Value Ref Range Status   SARS Coronavirus 2 by RT PCR NEGATIVE NEGATIVE Final    Comment: (NOTE) SARS-CoV-2 target nucleic acids are NOT DETECTED.  The SARS-CoV-2 RNA is generally detectable in upper respiratory specimens during the acute phase of infection. The lowest concentration of SARS-CoV-2 viral copies this assay can detect is 138 copies/mL. A negative result does not preclude SARS-Cov-2 infection and should not be used as the sole basis for treatment or other patient management decisions. A negative result may occur with  improper specimen collection/handling, submission of specimen other than nasopharyngeal swab, presence of viral mutation(s) within the areas targeted by this assay, and inadequate number of viral copies(<138 copies/mL). A negative result must be combined with clinical observations, patient history, and epidemiological information. The expected result is Negative.  Fact Sheet for Patients:  EntrepreneurPulse.com.au  Fact Sheet for Healthcare Providers:  IncredibleEmployment.be  This test is no t yet approved or cleared by the [place] FDA and  has been authorized for detection and/or diagnosis of SARS-CoV-2 by FDA under an Emergency Use Authorization (EUA). This EUA will remain  in effect (meaning this test can be used) for the duration of the COVID-19 declaration under Section 564(b)(1) of the Act,  21 U.S.C.section 360bbb-3(b)(1), unless the authorization is terminated  or revoked sooner.       Influenza A by PCR POSITIVE (A) NEGATIVE Final   Influenza B by PCR NEGATIVE NEGATIVE Final    Comment: (NOTE) The Xpert Xpress SARS-CoV-2/FLU/RSV plus assay is intended as an aid in the diagnosis of influenza from Nasopharyngeal swab specimens and should not be used as a sole basis for treatment. Nasal washings and aspirates are unacceptable for Xpert Xpress SARS-CoV-2/FLU/RSV testing.  Fact Sheet for Patients: EntrepreneurPulse.com.au  Fact Sheet for Healthcare Providers: IncredibleEmployment.be  This test is not yet approved or cleared by  the [employer] and has been authorized for detection and/or diagnosis of SARS-CoV-2 by FDA under an Emergency Use Authorization (EUA). This EUA will remain in effect (meaning this test can be used) for the duration of the COVID-19 declaration under Section 564(b)(1) of the Act, 21 U.S.C. section 360bbb-3(b)(1), unless the authorization is terminated or revoked.  Performed at [facility], [address] [place], [place] [address]     Time coordinating discharge: Over 30 minutes  SIGNED:  Lorella Nimrod, MD  [facility] 10/31/2020, 12:44 PM  If 7PM-7AM, please contact night-coverage [email or web address]  This record has been created using Systems analyst. Errors have been sought and corrected,but may not always be located. Such creation errors do not reflect on the standard of care.

## 2020-10-31 NOTE — Telephone Encounter (Signed)
  Reason for Disposition . [1] MODERATE difficulty breathing (e.g., speaks in phrases, SOB even at rest, pulse 100-120) AND [2] NEW-onset or WORSE than normal    Pt has no transportation- called 911  Answer Assessment - Initial Assessment Questions 1. RESPIRATORY STATUS: "Describe your breathing?" (e.g., wheezing, shortness of breath, unable to speak, severe coughing)      SOB 2. ONSET: "When did this breathing problem begin?"      Has flu was just discharged from [facility] today 3. PATTERN "Does the difficult breathing come and go, or has it been constant since it started?"      H/o COPD constant 4. SEVERITY: "How bad is your breathing?" (e.g., mild, moderate, severe)    - MILD: No SOB at rest, mild SOB with walking, speaks normally in sentences, can lie down, no retractions, pulse < 100.    - MODERATE: SOB at rest, SOB with minimal exertion and prefers to sit, cannot lie down flat, speaks in phrases, mild retractions, audible wheezing, pulse 100-120.    - SEVERE: Very SOB at rest, speaks in single words, struggling to breathe, sitting hunched forward, retractions, pulse > 120      moderate 5. RECURRENT SYMPTOM: "Have you had difficulty breathing before?" If Yes, ask: "When was the last time?" and "What happened that time?"      Yes- yesterday and has longstanding lung and heart issues 6. CARDIAC HISTORY: "Do you have any history of heart disease?" (e.g., heart attack, angina, bypass surgery, angioplasty)  Venous hypertension, HTN,  7. LUNG HISTORY: "Do you have any history of lung disease?"  (e.g., pulmonary embolus, asthma, emphysema)     Yes COPD 8. CAUSE: "What do you think is causing the breathing problem?"      flu 9. OTHER SYMPTOMS: "Do you have any other symptoms? (e.g., dizziness, runny nose, cough, chest pain, fever)     cough  Protocols used: BREATHING DIFFICULTY-A-AH

## 2020-11-02 ENCOUNTER — Other Ambulatory Visit: Payer: Self-pay

## 2020-11-02 ENCOUNTER — Emergency Department
Admission: EM | Admit: 2020-11-02 | Discharge: 2020-11-02 | Disposition: A | Discharge status: Home / Self Care | Payer: Medicare PPO | Attending: Emergency Medicine | Admitting: Emergency Medicine

## 2020-11-02 ENCOUNTER — Encounter: Payer: Self-pay | Admitting: Physician Assistant

## 2020-11-02 ENCOUNTER — Ambulatory Visit (INDEPENDENT_AMBULATORY_CARE_PROVIDER_SITE_OTHER): Payer: Medicare PPO | Admitting: Physician Assistant

## 2020-11-02 DIAGNOSIS — Z79899 Other long term (current) drug therapy: Secondary | ICD-10-CM | POA: Insufficient documentation

## 2020-11-02 DIAGNOSIS — J111 Influenza due to unidentified influenza virus with other respiratory manifestations: Secondary | ICD-10-CM | POA: Diagnosis not present

## 2020-11-02 DIAGNOSIS — E1122 Type 2 diabetes mellitus with diabetic chronic kidney disease: Secondary | ICD-10-CM | POA: Diagnosis not present

## 2020-11-02 DIAGNOSIS — I700 Atherosclerosis of aorta: Secondary | ICD-10-CM

## 2020-11-02 DIAGNOSIS — I2510 Atherosclerotic heart disease of native coronary artery without angina pectoris: Secondary | ICD-10-CM | POA: Diagnosis not present

## 2020-11-02 DIAGNOSIS — Z09 Encounter for follow-up examination after completed treatment for conditions other than malignant neoplasm: Secondary | ICD-10-CM | POA: Diagnosis not present

## 2020-11-02 DIAGNOSIS — Z7984 Long term (current) use of oral hypoglycemic drugs: Secondary | ICD-10-CM | POA: Insufficient documentation

## 2020-11-02 DIAGNOSIS — E782 Mixed hyperlipidemia: Secondary | ICD-10-CM | POA: Diagnosis not present

## 2020-11-02 DIAGNOSIS — Z87891 Personal history of nicotine dependence: Secondary | ICD-10-CM | POA: Insufficient documentation

## 2020-11-02 DIAGNOSIS — J449 Chronic obstructive pulmonary disease, unspecified: Secondary | ICD-10-CM | POA: Diagnosis not present

## 2020-11-02 DIAGNOSIS — R062 Wheezing: Secondary | ICD-10-CM | POA: Diagnosis present

## 2020-11-02 DIAGNOSIS — E1142 Type 2 diabetes mellitus with diabetic polyneuropathy: Secondary | ICD-10-CM | POA: Insufficient documentation

## 2020-11-02 DIAGNOSIS — R112 Nausea with vomiting, unspecified: Secondary | ICD-10-CM | POA: Diagnosis not present

## 2020-11-02 DIAGNOSIS — R0602 Shortness of breath: Secondary | ICD-10-CM | POA: Diagnosis not present

## 2020-11-02 DIAGNOSIS — Z8616 Personal history of COVID-19: Secondary | ICD-10-CM | POA: Insufficient documentation

## 2020-11-02 DIAGNOSIS — I5033 Acute on chronic diastolic (congestive) heart failure: Secondary | ICD-10-CM | POA: Diagnosis not present

## 2020-11-02 DIAGNOSIS — R110 Nausea: Secondary | ICD-10-CM | POA: Diagnosis not present

## 2020-11-02 DIAGNOSIS — Z794 Long term (current) use of insulin: Secondary | ICD-10-CM | POA: Insufficient documentation

## 2020-11-02 DIAGNOSIS — N183 Chronic kidney disease, stage 3 unspecified: Secondary | ICD-10-CM | POA: Diagnosis not present

## 2020-11-02 DIAGNOSIS — E279 Disorder of adrenal gland, unspecified: Secondary | ICD-10-CM

## 2020-11-02 DIAGNOSIS — I130 Hypertensive heart and chronic kidney disease with heart failure and stage 1 through stage 4 chronic kidney disease, or unspecified chronic kidney disease: Secondary | ICD-10-CM | POA: Diagnosis not present

## 2020-11-02 DIAGNOSIS — J101 Influenza due to other identified influenza virus with other respiratory manifestations: Secondary | ICD-10-CM

## 2020-11-02 DIAGNOSIS — Z7951 Long term (current) use of inhaled steroids: Secondary | ICD-10-CM | POA: Diagnosis not present

## 2020-11-02 DIAGNOSIS — N1832 Chronic kidney disease, stage 3b: Secondary | ICD-10-CM | POA: Diagnosis not present

## 2020-11-02 DIAGNOSIS — E1169 Type 2 diabetes mellitus with other specified complication: Secondary | ICD-10-CM | POA: Diagnosis not present

## 2020-11-02 DIAGNOSIS — R1111 Vomiting without nausea: Secondary | ICD-10-CM | POA: Diagnosis not present

## 2020-11-02 DIAGNOSIS — Z7982 Long term (current) use of aspirin: Secondary | ICD-10-CM | POA: Diagnosis not present

## 2020-11-02 DIAGNOSIS — R070 Pain in throat: Secondary | ICD-10-CM | POA: Diagnosis not present

## 2020-11-02 DIAGNOSIS — N289 Disorder of kidney and ureter, unspecified: Secondary | ICD-10-CM | POA: Diagnosis not present

## 2020-11-02 DIAGNOSIS — I5022 Chronic systolic (congestive) heart failure: Secondary | ICD-10-CM

## 2020-11-02 DIAGNOSIS — Z85528 Personal history of other malignant neoplasm of kidney: Secondary | ICD-10-CM | POA: Insufficient documentation

## 2020-11-02 DIAGNOSIS — Z86018 Personal history of other benign neoplasm: Secondary | ICD-10-CM | POA: Diagnosis not present

## 2020-11-02 LAB — CBC WITH DIFFERENTIAL/PLATELET
Abs Immature Granulocytes: 0.03 10*3/uL (ref 0.00–0.07)
Basophils Absolute: 0.1 10*3/uL (ref 0.0–0.1)
Basophils Relative: 1 %
Eosinophils Absolute: 0.1 10*3/uL (ref 0.0–0.5)
Eosinophils Relative: 1 %
HCT: 35.4 % — ABNORMAL LOW (ref 39.0–52.0)
Hemoglobin: 11.5 g/dL — ABNORMAL LOW (ref 13.0–17.0)
Immature Granulocytes: 1 %
Lymphocytes Relative: 44 %
Lymphs Abs: 2.5 10*3/uL (ref 0.7–4.0)
MCH: 28.9 pg (ref 26.0–34.0)
MCHC: 32.5 g/dL (ref 30.0–36.0)
MCV: 88.9 fL (ref 80.0–100.0)
Monocytes Absolute: 0.4 10*3/uL (ref 0.1–1.0)
Monocytes Relative: 7 %
Neutro Abs: 2.7 10*3/uL (ref 1.7–7.7)
Neutrophils Relative %: 46 %
Platelets: 214 10*3/uL (ref 150–400)
RBC: 3.98 MIL/uL — ABNORMAL LOW (ref 4.22–5.81)
RDW: 13.6 % (ref 11.5–15.5)
WBC: 5.8 10*3/uL (ref 4.0–10.5)
nRBC: 0 % (ref 0.0–0.2)

## 2020-11-02 LAB — BASIC METABOLIC PANEL
Anion gap: 9 (ref 5–15)
BUN: 47 mg/dL — ABNORMAL HIGH (ref 8–23)
CO2: 20 mmol/L — ABNORMAL LOW (ref 22–32)
Calcium: 8.8 mg/dL — ABNORMAL LOW (ref 8.9–10.3)
Chloride: 110 mmol/L (ref 98–111)
Creatinine, Ser: 2.54 mg/dL — ABNORMAL HIGH (ref 0.61–1.24)
GFR, Estimated: 27 mL/min — ABNORMAL LOW (ref >60)
Glucose, Bld: 153 mg/dL — ABNORMAL HIGH (ref 70–99)
Potassium: 4.6 mmol/L (ref 3.5–5.1)
Sodium: 139 mmol/L (ref 135–145)

## 2020-11-02 MED ORDER — IPRATROPIUM-ALBUTEROL 0.5-2.5 (3) MG/3ML IN SOLN
3.0000 mL | Freq: Once | RESPIRATORY_TRACT | Status: AC
  Administered 2020-11-02: 3 mL via RESPIRATORY_TRACT
  Filled 2020-11-02: qty 3

## 2020-11-02 NOTE — Discharge Instructions (Addendum)
Your exam and labs are normal and reassuring today.  Continue with your home medications as prescribed.  Use your nebulizer treatment up to 4 hours as needed for shortness of breath and wheezing.  Follow-up with your primary provider for ongoing symptoms peer return to the ED if needed.

## 2020-11-02 NOTE — ED Provider Notes (Signed)
[facility] Emergency Department Provider Note  ___________________________________________   Event Date/Time   First MD Initiated Contact with Patient 11/02/20 2002     (approximate)  I have reviewed the triage vital signs and the nursing notes.   HISTORY  Chief Complaint Influenza  HPI Larry Callahan is a 65 y.o. male with below medical history, presents to the ED for the second time since his diagnosis and overnight admission for influenza and COPD exacerbation.  Patient was admitted to the ED here on 5/20.  He returned to the ED on 5/21, after he was reportedly on the phone with a customer service representative in the hospital, calling to inquire about his opinion of his recent hospitalization.  According to the patient, she remarked that he sounded wheezy, and suggested that he consider returning to the ED, which she did.  He was found to be stable after that reevaluation, and was again discharged home to follow-up with a primary provider.  Patient reports compliance with the medications as prescribed, he denies any interim fevers, chills, or sweats.  He denies any significant chest pain or shortness of breath.  He does report some intermittent wheezing.  Patient presents to the ED with request for admission secondary to his recent diagnosis.  He is able to speak in complete sentences and vital signs are stable on intake without signs of tachycardia, hypoxia, or febrile illness.     Past Medical History:  Diagnosis Date  . Allergy   . COPD (chronic obstructive pulmonary disease) ([place])   . Coronary artery disease   . Diabetes mellitus without complication ([place])   . Diastolic CHF ([place])   . GERD (gastroesophageal reflux disease)   . Hyperlipidemia   . Hypertension   . Renal insufficiency   . Sleep apnea     Patient Active Problem List   Diagnosis Date Noted  . Demand ischemia ([place])   . Influenza A 10/30/2020  . Chest pain 10/06/2020  . HLD  (hyperlipidemia) 10/06/2020  . CVA (cerebral vascular accident) ([place]) 09/25/2020  . Gastroesophageal reflux disease without esophagitis 04/19/2020  . Pneumonia due to COVID-19 virus 01/30/2020  . Elevated troponin I level 01/29/2020  . Suspected COVID-19 virus infection 01/29/2020  . Postural dizziness with presyncope 01/29/2020  . Hematemesis 01/09/2020  . Hospital discharge follow-up 11/26/2019  . Cellulitis of left hand 09/26/2019  . Generalized weakness 08/08/2019  . Obesity (BMI 30-39.9) 08/08/2019  . Acute right hip pain 06/17/2019  . Inability to ambulate due to hip 06/17/2019  . CKD stage 3 due to type 2 diabetes mellitus ([place])   . Hypervolemia   . Acute on chronic diastolic CHF (congestive heart failure) ([place])   . Type 2 diabetes mellitus with stage 3b chronic kidney disease, with long-term current use of insulin ([place])   . Hypomagnesemia   . Full code status 05/20/2019  . Pleuritic chest pain   . Multifocal pneumonia 05/19/2019  . Acute kidney injury superimposed on CKD 3b ([place]) 05/09/2019  . Hemoptysis 04/09/2019  . Shortness of breath 03/26/2019  . Uncontrolled type 2 diabetes mellitus with insulin therapy ([place]) 03/12/2019  . Acute on chronic heart failure with preserved ejection fraction (HFpEF) ([place])   . Chronic kidney disease, stage 3b ([place])   . Chronic systolic CHF (congestive heart failure) ([place]) 03/01/2019  . Diastolic dysfunction 65/78/4696  . Iron deficiency anemia 12/31/2018  . Atopic dermatitis 11/24/2018  . Screening for colon cancer 11/06/2017  . Uncontrolled type 2 diabetes mellitus  with hyperglycemia ([place]) 11/06/2017  . Hidradenitis 06/09/2017  . Atherosclerotic heart disease of native coronary artery without angina pectoris 06/09/2017  . 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 Obstructive sleep apnea, adult 06/09/2017  . Morbid (severe) obesity due to excess calories ([place]) 06/09/2017  . Cigarette nicotine dependence  with nicotine-induced disorder 06/09/2017  . Diabetic polyneuropathy associated with type 2 diabetes mellitus ([place]) 06/09/2017  . Allergic rhinitis due to pollen 06/09/2017  . Mixed hyperlipidemia 06/09/2017  . COPD (chronic obstructive pulmonary disease) ([place]) 06/09/2017  . Dyspnea on exertion 06/09/2017  . Wheezing 06/09/2017  . Dysuria 06/09/2017  . Snoring 06/09/2017  . Essential hypertension 06/09/2017  . Personal history of other malignant neoplasm of kidney 06/09/2017  . Pain in right hip 06/09/2017  . Impacted cerumen, bilateral 06/09/2017  . Secondary hyperparathyroidism, not elsewhere classified ([place]) 06/09/2017  . Type 2 diabetes mellitus with hyperglycemia ([place]) 06/09/2017  . Tinea corporis 06/09/2017    Past Surgical History:  Procedure Laterality Date  . BACK SURGERY    . CARDIAC CATHETERIZATION    . CHOLECYSTECTOMY    . COLONOSCOPY WITH PROPOFOL N/A 04/12/2019   Procedure: COLONOSCOPY WITH PROPOFOL;  Surgeon: [name], MD;  Location: [facility] ENDOSCOPY;  Service: Gastroenterology;  Laterality: N/A;  . ESOPHAGOGASTRODUODENOSCOPY N/A 04/12/2019   Procedure: ESOPHAGOGASTRODUODENOSCOPY (EGD);  Surgeon: [name], MD;  Location: [facility] ENDOSCOPY;  Service: Gastroenterology;  Laterality: N/A;  . ESOPHAGOGASTRODUODENOSCOPY (EGD) WITH PROPOFOL N/A 11/21/2019   Procedure: ESOPHAGOGASTRODUODENOSCOPY (EGD) WITH PROPOFOL;  Surgeon: [name], MD;  Location: [facility] ENDOSCOPY;  Service: Gastroenterology;  Laterality: N/A;  . FLEXIBLE BRONCHOSCOPY Bilateral 05/17/2019   Procedure: FLEXIBLE BRONCHOSCOPY;  Surgeon: [name], MD;  Location: [name] ORS;  Service: Pulmonary;  Laterality: Bilateral;  . left arm surgery    . nephrectomy Left   . PARATHYROIDECTOMY    . RIGHT HEART CATH N/A 03/29/2019   Procedure: RIGHT HEART CATH;  Surgeon: [name], MD;  Location: [facility] CV LAB;  Service: Cardiovascular;  Laterality: N/A;    Prior to Admission  medications   Medication Sig Start Date End Date Taking? Authorizing Provider  albuterol (VENTOLIN HFA) 108 (90 Base) MCG/ACT inhaler Inhale 2 puffs into the lungs every 6 (six) hours as needed for wheezing or shortness of breath. 08/31/20   Lavera Guise, MD  aspirin EC 81 MG EC tablet Take 1 tablet (81 mg total) by mouth daily. Swallow whole. 11/01/20   Lorella Nimrod, MD  Blood Glucose Calibration (TRUE METRIX LEVEL 1) Low SOLN Use as directed 04/04/18   [name], NP  Blood Glucose Monitoring Suppl (TRUE METRIX AIR GLUCOSE METER) DEVI 1 Device by Does not apply route 2 (two) times daily. 04/13/18   [name], NP  budesonide-formoterol (SYMBICORT) 160-4.5 MCG/ACT inhaler Inhale 2 puffs into the lungs 2 (two) times daily. 07/20/20   Lavera Guise, MD  carvedilol (COREG) 6.25 MG tablet Take 1 tablet (6.25 mg total) by mouth 2 (two) times daily with a meal. 10/08/20   Jennye Boroughs, MD  cetirizine (ZYRTEC) 10 MG tablet Take 10 mg by mouth daily.    [provider]  escitalopram (LEXAPRO) 10 MG tablet Take one tab po qd for anxiety with food Patient taking differently: Take 10 mg by mouth daily. Take one tab po qd for anxiety with food 07/17/20   Lavera Guise, MD  ferrous gluconate (FERGON) 324 MG tablet Take 1 tablet (324 mg total)  by mouth daily with breakfast. 08/01/19   [name], NP  furosemide (LASIX) 40 MG tablet Take one to tabs once or twice days for fluid // swelling 07/17/20   Lavera Guise, MD  gabapentin (NEURONTIN) 100 MG capsule Take 1 capsule (100 mg total) by mouth in the morning, at noon, and at bedtime. 07/17/20   Lavera Guise, MD  glimepiride Jari Sportsman) 2 MG tablet Take one tab po qd with supper 07/17/20   Lavera Guise, MD  glucose blood (TRUE METRIX BLOOD GLUCOSE TEST) test strip Use as instructed twice daily diag E11.65 07/30/19   [name], NP  hydrALAZINE (APRESOLINE) 100 MG tablet Take 1 tablet (100 mg total) by mouth 3 (three) times daily. 07/17/20    Lavera Guise, MD  insulin glargine, 1 Unit Dial, (TOUJEO) 300 UNIT/ML Solostar Pen Inject 26 Units into the skin daily. 04/30/20   [name], NP  ipratropium-albuterol (DUONEB) 0.5-2.5 (3) MG/3ML SOLN Take 3 mLs by nebulization every 6 (six) hours as needed. 10/11/20   Lavera Guise, MD  isosorbide mononitrate (IMDUR) 30 MG 24 hr tablet Take 1 tablet (30 mg total) by mouth 2 (two) times daily. 10/08/20   Jennye Boroughs, MD  mupirocin ointment (BACTROBAN) 2 % Apply 1 application topically daily. Qd to excision site 10/06/20   Ralene Bathe, MD  NOVOFINE PEN NEEDLE 32G X 6 MM MISC Use as directed 10/15/20   Lavera Guise, MD  omeprazole (PRILOSEC) 40 MG capsule Take 1 capsule (40 mg total) by mouth in the morning and at bedtime. 07/17/20   Lavera Guise, MD  oseltamivir (TAMIFLU) 30 MG capsule Take 1 capsule (30 mg total) by mouth 2 (two) times daily for 5 days. 10/31/20 11/05/20  Lorella Nimrod, MD  potassium chloride SA (KLOR-CON) 20 MEQ tablet Take 1 tablet (20 mEq total) by mouth 2 (two) times daily. 07/17/20   Lavera Guise, MD  predniSONE (DELTASONE) 20 MG tablet Take 1 tablet (20 mg total) by mouth daily with breakfast for 4 days. 10/31/20 11/04/20  Lorella Nimrod, MD  tiotropium (SPIRIVA HANDIHALER) 18 MCG inhalation capsule Place 1 capsule (18 mcg total) into inhaler and inhale daily. 06/02/20 08/31/20  [name], NP  TRUEplus Lancets 33G MISC Use as directed twice daily diag E11.65 07/30/19   [name], NP    Allergies Penicillins  Family History  Problem Relation Age of Onset  . Diabetes Mother        2003  . Lung cancer Father   . Diabetes Sister   . Hypertension Sister   . Heart disease Sister   . Cancer Sister   . Bone cancer Brother     Social History Social History   Tobacco Use  . Smoking status: Former Smoker    Types: Cigarettes  . Smokeless tobacco: Former Systems developer    Types: Secondary school teacher  . Vaping Use: Never used  Substance Use Topics  . Alcohol  use: No  . Drug use: No    Review of Systems Constitutional: No fever/chills Eyes: No visual changes. ENT: No sore throat. Cardiovascular: Denies chest pain. Respiratory: Denies shortness of breath. Gastrointestinal: No abdominal pain.  No nausea, no vomiting.  No diarrhea.  No constipation. Genitourinary: Negative for dysuria. Musculoskeletal: Negative for back pain. Skin: Negative for rash. Neurological: Negative for headaches, focal weakness or numbness. ____________________________________________   PHYSICAL EXAM:  VITAL SIGNS: ED Triage Vitals  Enc Vitals Group  BP 11/02/20 1811 (!) 155/76     Pulse Rate 11/02/20 1811 72     Resp 11/02/20 1811 19     Temp 11/02/20 1811 98 F (36.7 C)     Temp Source 11/02/20 1811 Oral     SpO2 11/02/20 1811 94 %     Weight --      Height --      Head Circumference --      Peak Flow --      Pain Score 11/02/20 1751 4     Pain Loc --      Pain Edu? --      Excl. in [place]? --     Constitutional: Alert and oriented. Well appearing and in no acute distress. Eyes: Conjunctivae are normal. PERRL. EOMI. Head: Atraumatic. Nose: No congestion/rhinnorhea. Mouth/Throat: Mucous membranes are moist.  Oropharynx non-erythematous. Neck: No stridor.   Cardiovascular: Normal rate, regular rhythm. Grossly normal heart sounds.  Good peripheral circulation. Respiratory: Normal respiratory effort.  No retractions. Lungs CTAB. Gastrointestinal: Soft and nontender. No distention. No abdominal bruits. No CVA tenderness. Musculoskeletal: No lower extremity tenderness nor edema.  No joint effusions. Neurologic:  Normal speech and language. No gross focal neurologic deficits are appreciated. No gait instability. Skin:  Skin is warm, dry and intact. No rash noted. Psychiatric: Mood and affect are normal. Speech and behavior are normal. ____________________________________________   LABS (all labs ordered are listed, but only abnormal results are  displayed)  Labs Reviewed  BASIC METABOLIC PANEL - Abnormal; Notable for the following components:      Result Value   CO2 20 (*)    Glucose, Bld 153 (*)    BUN 47 (*)    Creatinine, Ser 2.54 (*)    Calcium 8.8 (*)    GFR, Estimated 27 (*)    All other components within normal limits  CBC WITH DIFFERENTIAL/PLATELET - Abnormal; Notable for the following components:   RBC 3.98 (*)    Hemoglobin 11.5 (*)    HCT 35.4 (*)    All other components within normal limits   ____________________________________________  EKG   ____________________________________________  RADIOLOGY I, Melvenia Needles, personally viewed and evaluated these images (plain radiographs) as part of my medical decision making, as well as reviewing the written report by the radiologist.  ED MD interpretation:    Official radiology report(s): No results found.  ____________________________________________   PROCEDURES  Procedure(s) performed (including Critical Care):  Procedures   ____________________________________________   INITIAL IMPRESSION / ASSESSMENT AND PLAN / ED COURSE  As part of my medical decision making, I reviewed the following data within the [facility] chart reviewed and Notes from prior ED visits   Patient with subsequent ED evaluation for ongoing symptoms related to his recent flu diagnosis.  Patient has been taking the medications as prescribed and using his home meds as previously provided.  He presents with normal vital signs including 90% saturation on room air and normal heart rate and respirations.  No signs of acute respiratory distress on presentation or exam.  Patient will be treated with a DuoNeb inhaler, and will likely be stable for discharge at that time.  He is encouraged to follow-up with his primary provider for ongoing symptomatic.  Return precautions have been discussed. ____________________________________________   FINAL CLINICAL  IMPRESSION(S) / ED DIAGNOSES  Final diagnoses:  Influenza     ED Discharge Orders    None      *Please note:  Jeneen Rinks  Fabion Gatson was evaluated in Emergency Department on 11/02/2020 for the symptoms described in the history of present illness. He was evaluated in the context of the global COVID-19 pandemic, which necessitated consideration that the patient might be at risk for infection with the SARS-CoV-2 virus that causes COVID-19. Institutional protocols and algorithms that pertain to the evaluation of patients at risk for COVID-19 are in a state of rapid change based on information released by regulatory bodies including the CDC and federal and state organizations. These policies and algorithms were followed during the patient's care in the ED.  Some ED evaluations and interventions may be delayed as a result of limited staffing during and the pandemic.*   Note:  This document was prepared using Dragon voice recognition software and may include unintentional dictation errors.    Melvenia Needles, PA-C 11/02/20 2149    Arta Silence, MD 11/03/20 [id number]

## 2020-11-02 NOTE — ED Notes (Signed)
ED Provider at bedside. 

## 2020-11-02 NOTE — ED Triage Notes (Signed)
Pt comes via EMs with c/o flu. Pt tested positive Friday. Pt has cold , fever and congestion.

## 2020-11-02 NOTE — ED Notes (Signed)
Pt calm , collective , denied pain or sob  

## 2020-11-02 NOTE — Progress Notes (Signed)
[facility] [place], [place] [address]  Internal MEDICINE  Office Visit Note  Patient Name: Larry Callahan  [id number]  [id number]  Date of Service: 11/04/2020     Chief Complaint  Patient presents with  . Follow-up    Pt had flu, discuss meds, pt wants samples,   . Sleep Apnea  . COPD  . Gastroesophageal Reflux  . Hyperlipidemia  . Hypertension  . Diabetes  . Quality Metric Gaps    Eye exam     HPI Pt is here for recent hospital follow up. Went to ED on 10/30/20 after visit with Dr. Rockey Situ recommended he go due to cough, SOB, CP and hemoptysis.  -He is still coughing, but no blood anymore. CXray and CT chest, abdomen, pelvis done in ED.  Pt was believed to have demand ischemia with hx of HFrEF 35-40% and was told to follow up with cardiology. Tested positive for the flu and based on patient request was sent to observation for 1 night and discharged on 5/21.Marland Kitchen He was given tamiflu for 5 days and short course prednisone. No evidence of any cause for hemoptysis found and no further occurrence of this. -During a customer service call after d/c, pt states that he was informed to go back to ED since he sounded like he was wheezing. He then went back to ED on 5/21 and was found to be stable and discharged.  -Breathing feels better since leaving the hospital. Does still have a cough. Denies chest pain or SOB. Does have a hoarse voice. He has not picked up his medications yet and states he is going to the pharmacy now and will start on them. -Patient also requesting toujeo sample, which was given in office today since patient needs enough to get to next disability check for his refills. Discussed looking into patient assistance for him but he states it usually is not a problem for him to get his medications.  CXR 10/30/20: Pulmonary venous hypertension pattern but without evidence of frank edema. No infiltrate, collapse or effusion. Aortic  atherosclerosis. CXR 10/31/20: No active cardiopulmonary disease  CT Chest, abdomen, pelvis 10/30/20:  1. No acute findings are noted in the chest, abdomen or pelvis to account for the patient's hematemesis or hemoptysis. 2. Slowly growing left adrenal lesion which may simply represent a mildly complex adrenal cyst, however, the possibility of a slowly growing adrenal neoplasm warrants consideration. Further evaluation with nonemergent adrenal protocol CT scan is recommended in the near future to provide more definitive characterization. 3. At the time of follow-up adrenal protocol CT, attention to the indeterminate 2.9 cm lesion in the lower pole of the right kidney is recommended to exclude neoplasm. 4. Colonic diverticulosis without evidence of acute diverticulitis at this time. 5. Large umbilical hernia containing only omental fat. No associated bowel incarceration or obstruction at this time. 6. Aortic atherosclerosis, in addition to left main and 2 vessel coronary artery disease. Please note that although the presence of coronary artery calcium documents the presence of coronary artery disease, the severity of this disease and any potential stenosis cannot be assessed on this non-gated CT examination. Assessment for potential risk factor modification, dietary therapy or pharmacologic therapy may be warranted, if clinically indicated. 7. Additional incidental findings, as above.  Current Medication: Outpatient Encounter Medications as of 11/02/2020  Medication Sig Note  . albuterol (VENTOLIN HFA) 108 (90 Base) MCG/ACT inhaler Inhale 2 puffs into the lungs every 6 (six) hours as needed for  wheezing or shortness of breath.   Marland Kitchen aspirin EC 81 MG EC tablet Take 1 tablet (81 mg total) by mouth daily. Swallow whole.   . Blood Glucose Calibration (TRUE METRIX LEVEL 1) Low SOLN Use as directed   . Blood Glucose Monitoring Suppl (TRUE METRIX AIR GLUCOSE METER) DEVI 1 Device by Does not apply  route 2 (two) times daily.   . budesonide-formoterol (SYMBICORT) 160-4.5 MCG/ACT inhaler Inhale 2 puffs into the lungs 2 (two) times daily.   . carvedilol (COREG) 6.25 MG tablet Take 1 tablet (6.25 mg total) by mouth 2 (two) times daily with a meal.   . cetirizine (ZYRTEC) 10 MG tablet Take 10 mg by mouth daily.   Marland Kitchen escitalopram (LEXAPRO) 10 MG tablet Take one tab po qd for anxiety with food (Patient taking differently: Take 10 mg by mouth daily. Take one tab po qd for anxiety with food)   . ferrous gluconate (FERGON) 324 MG tablet Take 1 tablet (324 mg total) by mouth daily with breakfast.   . furosemide (LASIX) 40 MG tablet Take one to tabs once or twice days for fluid // swelling   . gabapentin (NEURONTIN) 100 MG capsule Take 1 capsule (100 mg total) by mouth in the morning, at noon, and at bedtime.   Marland Kitchen glimepiride (AMARYL) 2 MG tablet Take one tab po qd with supper   . glucose blood (TRUE METRIX BLOOD GLUCOSE TEST) test strip Use as instructed twice daily diag E11.65   . hydrALAZINE (APRESOLINE) 100 MG tablet Take 1 tablet (100 mg total) by mouth 3 (three) times daily. 10/30/2020: Pt takes in the morning, another at 1 pm and the last one in the evening  . insulin glargine, 1 Unit Dial, (TOUJEO) 300 UNIT/ML Solostar Pen Inject 26 Units into the skin daily.   Marland Kitchen ipratropium-albuterol (DUONEB) 0.5-2.5 (3) MG/3ML SOLN Take 3 mLs by nebulization every 6 (six) hours as needed.   . isosorbide mononitrate (IMDUR) 30 MG 24 hr tablet Take 1 tablet (30 mg total) by mouth 2 (two) times daily.   . mupirocin ointment (BACTROBAN) 2 % Apply 1 application topically daily. Qd to excision site   . NOVOFINE PEN NEEDLE 32G X 6 MM MISC Use as directed   . omeprazole (PRILOSEC) 40 MG capsule Take 1 capsule (40 mg total) by mouth in the morning and at bedtime.   Marland Kitchen oseltamivir (TAMIFLU) 30 MG capsule Take 1 capsule (30 mg total) by mouth 2 (two) times daily for 5 days.   . potassium chloride SA (KLOR-CON) 20 MEQ tablet  Take 1 tablet (20 mEq total) by mouth 2 (two) times daily.   . predniSONE (DELTASONE) 20 MG tablet Take 1 tablet (20 mg total) by mouth daily with breakfast for 4 days.   Marland Kitchen tiotropium (SPIRIVA HANDIHALER) 18 MCG inhalation capsule Place 1 capsule (18 mcg total) into inhaler and inhale daily.   . TRUEplus Lancets 33G MISC Use as directed twice daily diag E11.65    No facility-administered encounter medications on file as of 11/02/2020.    Surgical History: Past Surgical History:  Procedure Laterality Date  . BACK SURGERY    . CARDIAC CATHETERIZATION    . CHOLECYSTECTOMY    . COLONOSCOPY WITH PROPOFOL N/A 04/12/2019   Procedure: COLONOSCOPY WITH PROPOFOL;  Surgeon: [name], MD;  Location: [facility] ENDOSCOPY;  Service: Gastroenterology;  Laterality: N/A;  . ESOPHAGOGASTRODUODENOSCOPY N/A 04/12/2019   Procedure: ESOPHAGOGASTRODUODENOSCOPY (EGD);  Surgeon: [name], MD;  Location: [facility] ENDOSCOPY;  Service:  Gastroenterology;  Laterality: N/A;  . ESOPHAGOGASTRODUODENOSCOPY (EGD) WITH PROPOFOL N/A 11/21/2019   Procedure: ESOPHAGOGASTRODUODENOSCOPY (EGD) WITH PROPOFOL;  Surgeon: [name], MD;  Location: [facility] ENDOSCOPY;  Service: Gastroenterology;  Laterality: N/A;  . FLEXIBLE BRONCHOSCOPY Bilateral 05/17/2019   Procedure: FLEXIBLE BRONCHOSCOPY;  Surgeon: [name], MD;  Location: [name] ORS;  Service: Pulmonary;  Laterality: Bilateral;  . left arm surgery    . nephrectomy Left   . PARATHYROIDECTOMY    . RIGHT HEART CATH N/A 03/29/2019   Procedure: RIGHT HEART CATH;  Surgeon: [name], MD;  Location: [facility] LAB;  Service: Cardiovascular;  Laterality: N/A;    Medical History: Past Medical History:  Diagnosis Date  . Allergy   . COPD (chronic obstructive pulmonary disease) ([place])   . Coronary artery disease   . Diabetes mellitus without complication ([place])   . Diastolic CHF ([place])   . GERD (gastroesophageal reflux disease)   . Hyperlipidemia    . Hypertension   . Renal insufficiency   . Sleep apnea     Family History: Family History  Problem Relation Age of Onset  . Diabetes Mother        2003  . Lung cancer Father   . Diabetes Sister   . Hypertension Sister   . Heart disease Sister   . Cancer Sister   . Bone cancer Brother     Social History   Socioeconomic History  . Marital status: Single    Spouse name: Not on file  . Number of children: Not on file  . Years of education: Not on file  . Highest education level: Not on file  Occupational History  . Not on file  Tobacco Use  . Smoking status: Former Smoker    Types: Cigarettes  . Smokeless tobacco: Former Systems developer    Types: Secondary school teacher  . Vaping Use: Never used  Substance and Sexual Activity  . Alcohol use: No  . Drug use: No  . Sexual activity: Not on file  Other Topics Concern  . Not on file  Social History Narrative  . Not on file   Social Determinants of Health   Financial Resource Strain: Not on file  Food Insecurity: Not on file  Transportation Needs: Not on file  Physical Activity: Not on file  Stress: Not on file  Social Connections: Not on file  Intimate Partner Violence: Not on file      Review of Systems  Constitutional: Positive for fatigue. Negative for chills and unexpected weight change.  HENT: Positive for voice change. Negative for congestion, postnasal drip, rhinorrhea, sneezing and sore throat.        Voice is hoarse with recent coughing  Eyes: Negative for redness.  Respiratory: Positive for cough. Negative for chest tightness, shortness of breath and wheezing.   Cardiovascular: Negative for chest pain and palpitations.  Gastrointestinal: Negative for abdominal pain, constipation, diarrhea, nausea and vomiting.  Genitourinary: Negative for dysuria and frequency.  Musculoskeletal: Negative for arthralgias, back pain, joint swelling and neck pain.  Skin: Negative for rash.  Neurological: Negative.  Negative for  tremors and numbness.  Hematological: Negative for adenopathy. Does not bruise/bleed easily.  Psychiatric/Behavioral: Positive for agitation. Negative for behavioral problems (Depression), sleep disturbance and suicidal ideas. The patient is not nervous/anxious.        Pt is agitated that he was told by a [employer] rep to go back to ED if he was wheezing more    Vital Signs:  BP (!) 146/82   Pulse 75   Temp (!) 97.4 F (36.3 C)   Resp 16   Ht _0  (1.803 m)   Wt 272 lb 6.4 oz (123.6 kg)   SpO2 97%   BMI 37.99 kg/m    Physical Exam Vitals and nursing note reviewed.  Constitutional:      General: He is not in acute distress.    Appearance: He is well-developed. He is obese. He is not diaphoretic.  HENT:     Head: Normocephalic and atraumatic.     Mouth/Throat:     Pharynx: No oropharyngeal exudate.  Eyes:     Pupils: Pupils are equal, round, and reactive to light.  Neck:     Thyroid: No thyromegaly.     Vascular: No JVD.     Trachea: No tracheal deviation.  Cardiovascular:     Rate and Rhythm: Normal rate and regular rhythm.     Heart sounds: Normal heart sounds. No murmur heard. No friction rub. No gallop.   Pulmonary:     Effort: Pulmonary effort is normal. No respiratory distress.     Breath sounds: Wheezing present. No rales.  Chest:     Chest wall: No tenderness.  Abdominal:     General: Bowel sounds are normal.     Palpations: Abdomen is soft.  Musculoskeletal:        General: Normal range of motion.     Cervical back: Normal range of motion and neck supple.  Lymphadenopathy:     Cervical: No cervical adenopathy.  Skin:    General: Skin is warm and dry.  Neurological:     Mental Status: He is alert and oriented to person, place, and time.     Cranial Nerves: No cranial nerve deficit.  Psychiatric:        Behavior: Behavior normal.        Thought Content: Thought content normal.        Judgment: Judgment normal.       Assessment/Plan: 1. Encounter for  examination following treatment at hospital Reviewed ED visit and hospital course.  Patient has been improving and will pick up Tamiflu and prednisone prescribed by ED  2. Influenza A Patient will start on Tamiflu and prednisone as prescribed by ED, symptoms are stable at this time  3. Chronic obstructive pulmonary disease, unspecified COPD type ([place]) Continue breathing treatments as indicated  4. Type 2 diabetes mellitus with stage 3b chronic kidney disease, with long-term current use of insulin ([place]) Patient requesting Toujeo sample today 1 box Toujeo given.  Patient also offered patient assistance for Toujeo however patient declines at this time  5. Atherosclerosis of aorta (HCC) Continue ASA and Crestor  6. Chronic systolic heart failure ([place]) Follow by cardiology  7. Lesion of adrenal gland (HCC) Will need adrenal protocol CT once acute illness has resolved based on CT abdomen finding on left adrenal  8. Renal lesion Will need CT to further evaluate lesion in lower right pole once acute illness resolved   General Counseling: Pattricia Boss understanding of the findings of todays visit and agrees with plan of treatment. I have discussed any further diagnostic evaluation that may be needed or ordered today. We also reviewed his medications today. he has been encouraged to call the office with any questions or concerns that should arise related to todays visit.    Counseling:    No orders of the defined types were placed in this encounter.   This patient was  seen by Drema Dallas, PA-C in collaboration with Dr. Clayborn Bigness as a part of collaborative care agreement.   I have reviewed all medical records from hospital follow up including radiology reports and consults from other physicians. Appropriate follow up diagnostics will be scheduled as needed. Patient/ Family understands the plan of treatment. Time spent 30 minutes.   Dr Lavera Guise, MD Internal Medicine

## 2020-11-02 NOTE — ED Notes (Signed)
Pt reports he has been to the ED and to his PCP several times since being diagnosed with influenza last week due to sore throat and pain in ribs where he has been coughing. Pt has been prescribed medication that he sts is not working. Pt requesting to be admitted until he feels better.   Oxygen while yelling at writer in room is 98% RA with HR of 69bpm. Pt asked to lower voice and agrees to do so.

## 2020-11-03 ENCOUNTER — Telehealth: Payer: Self-pay

## 2020-11-03 NOTE — Telephone Encounter (Signed)
-----   Message from Ralene Bathe, MD sent at 10/16/2020 11:34 AM EDT ----- Diagnosis Skin , left upper eyelid MULTIPLE ACROCHORDONS  Benign skin tags

## 2020-11-03 NOTE — Telephone Encounter (Signed)
Left message on voicemail to return my call.  

## 2020-11-04 ENCOUNTER — Telehealth: Payer: Self-pay

## 2020-11-04 NOTE — Telephone Encounter (Signed)
-----   Message from Ralene Bathe, MD sent at 10/16/2020 11:34 AM EDT ----- Diagnosis Skin , left upper eyelid MULTIPLE ACROCHORDONS  Benign skin tags

## 2020-11-04 NOTE — Telephone Encounter (Signed)
Left message on voicemail to return my call.  

## 2020-11-05 IMAGING — NM NM PULMONARY VENT & PERF
2 series · 16 of 16 positions shown · non-contrast
Comparison: Chest radiograph March 26, 2019

CLINICAL DATA: Chest pain with positive D-dimer study

EXAM:
NUCLEAR MEDICINE VENTILATION - PERFUSION LUNG SCAN
VIEWS:
Anterior, posterior, left lateral, right lateral, RPO, LPO, RAO,
LAO-ventilation and perfusion
RADIOPHARMACEUTICALS:  29.714 mCi of 1c-UUm DTPA aerosol inhalation
and 4.763 mCi 7cUUm MAA IV

[Series 1000: lung ventilation · 3.90mm/px · 4 acquisitions, 8 frames shown]
[im 1/4]
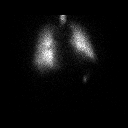
[im 1/4]
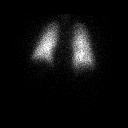
[im 2/4]
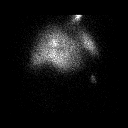
[im 2/4]
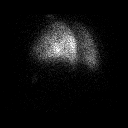
[im 3/4  full-range]
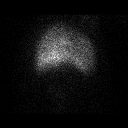
[im 3/4  full-range]
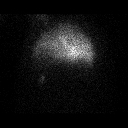
[im 4/4]
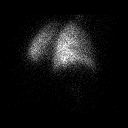
[im 4/4]
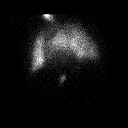

[Series 1000: lung perfusion · 1.95mm/px · 4 acquisitions, 8 frames shown]
[im 1/4]
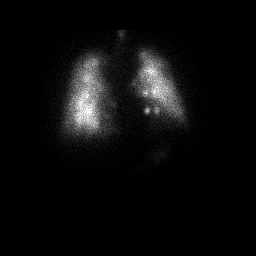
[im 1/4]
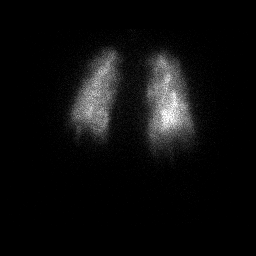
[im 2/4]
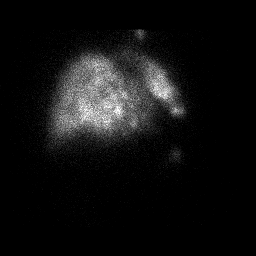
[im 2/4]
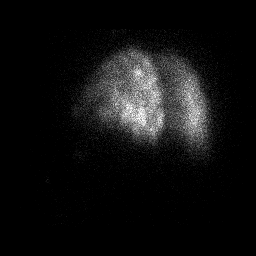
[im 3/4  full-range]
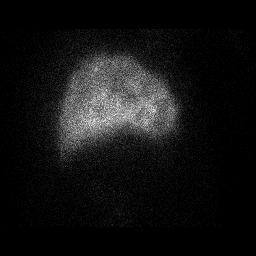
[im 3/4  full-range]
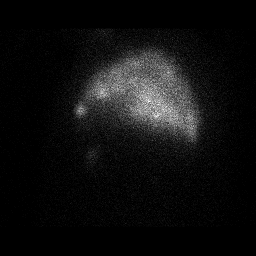
[im 4/4]
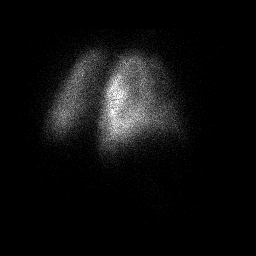
[im 4/4]
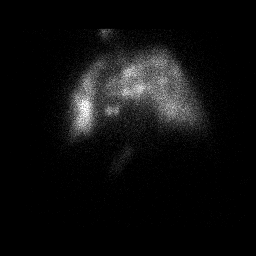

[16 of 16 positions shown; findings below may reference images not displayed]

FINDINGS: Ventilation: Radiotracer uptake bilaterally is homogeneous and
symmetric bilaterally. No ventilation defects are appreciable.

Perfusion: Radiotracer uptake is homogeneous and symmetric
bilaterally. No perfusion defects are appreciable.
IMPRESSION: No appreciable ventilation or perfusion defects. Very low
probability of pulmonary embolus.

## 2020-11-05 IMAGING — CT CT HEAD W/O CM
3 series · 15 of 47 positions shown, 18 images · non-contrast
Comparison: CT head 10/23/2011

CLINICAL DATA: Headache for several days.

EXAM:
CT HEAD WITHOUT CONTRAST
TECHNIQUE: Contiguous axial images were obtained from the base of the skull
through the vertex without intravenous contrast.

[Series 2: head wo · axial · 0.41mm/px · z∈[+316,+441]mm · 9 of 30 slices shown, 12 images]
[im 3/30  brain]
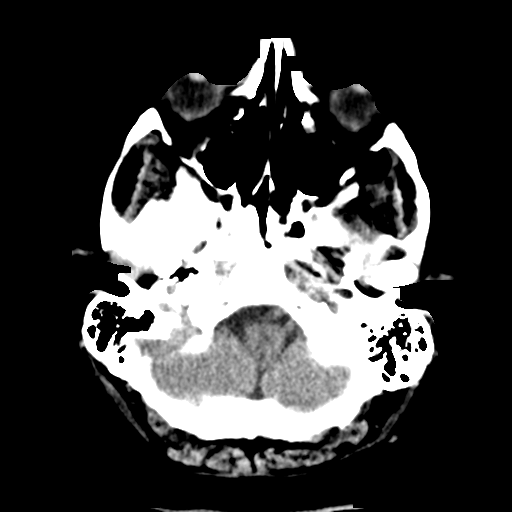
[im 3/30  bone]
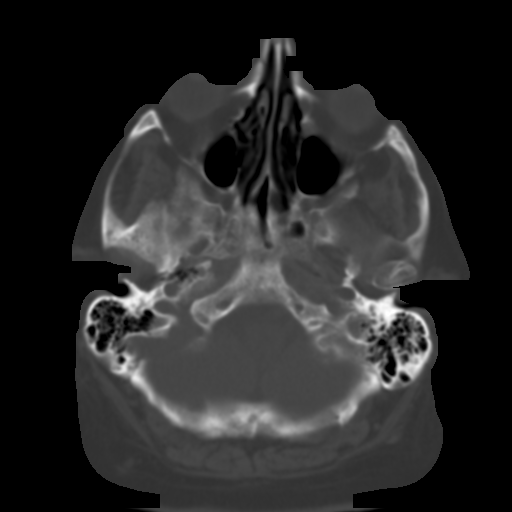
[im 6/30  brain]
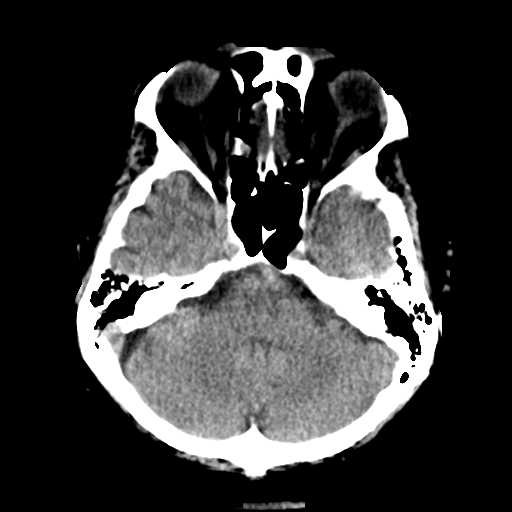
[im 9/30  brain]
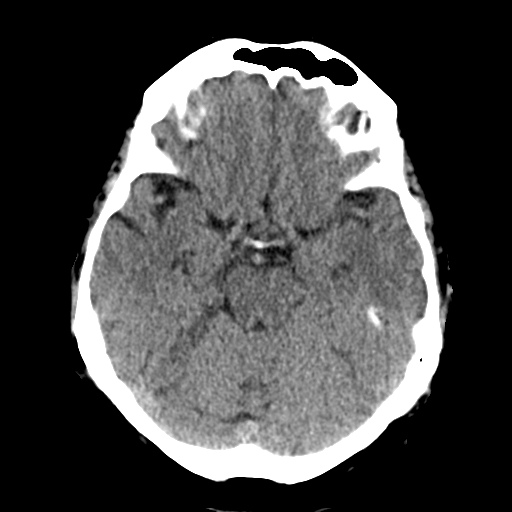
[im 12/30  brain]
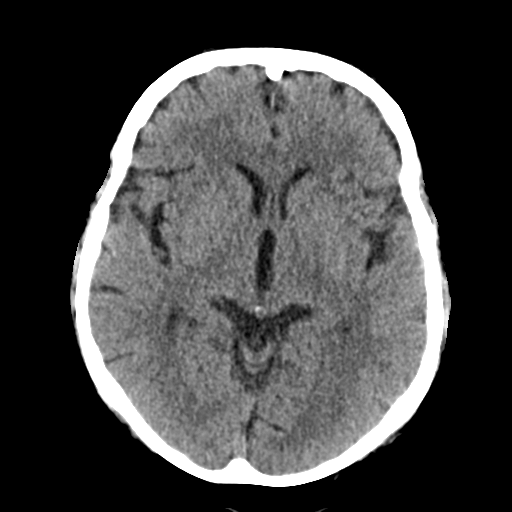
[im 16/30  brain]
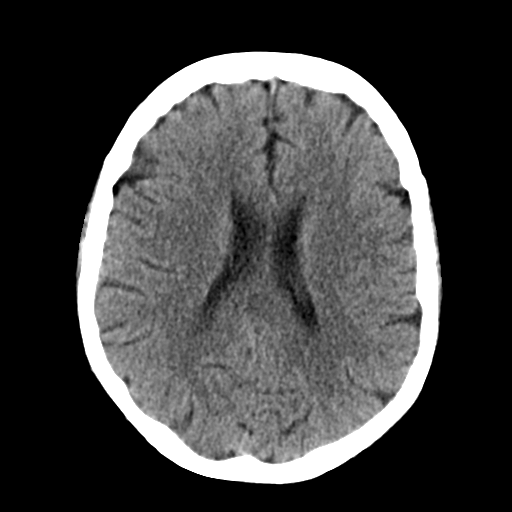
[im 16/30  bone]
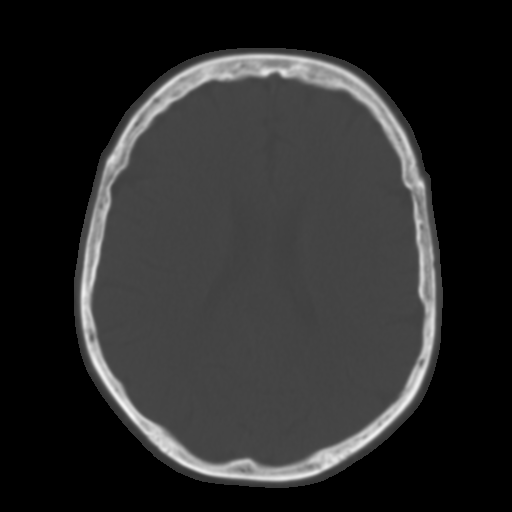
[im 19/30  brain]
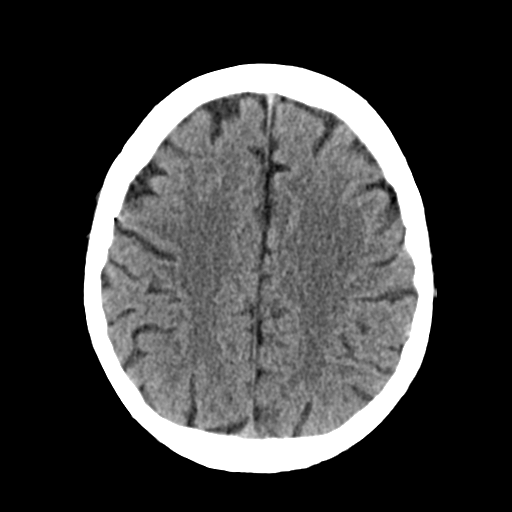
[im 22/30  brain]
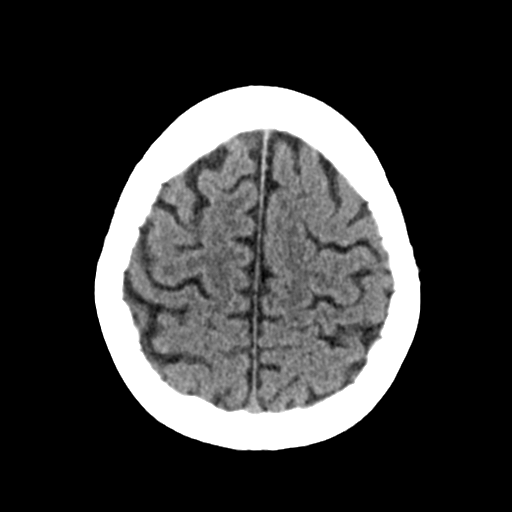
[im 25/30  brain]
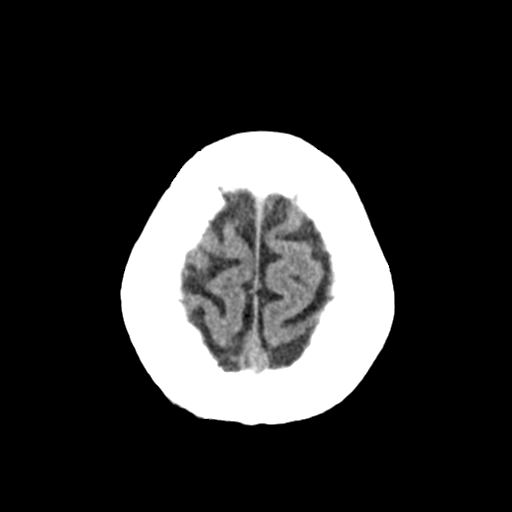
[im 28/30  brain]
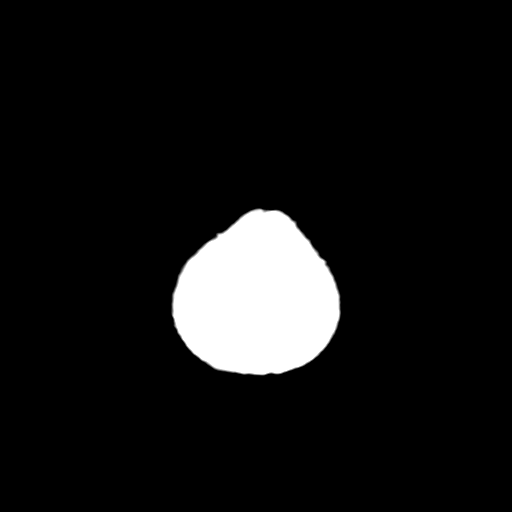
[im 28/30  bone]
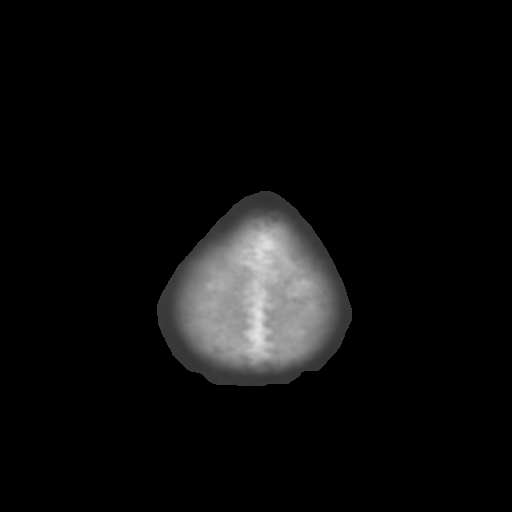

[Series 4: coronal soft tissue · coronal · 0.30mm/px · 3 of 69 slices shown]
[im 23/69  brain]
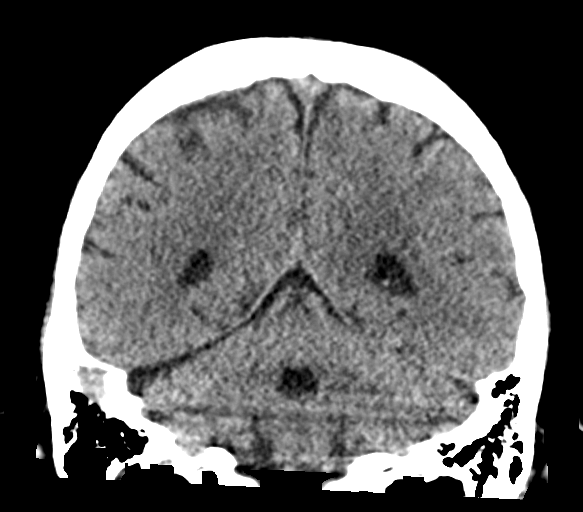
[im 31/69  brain]
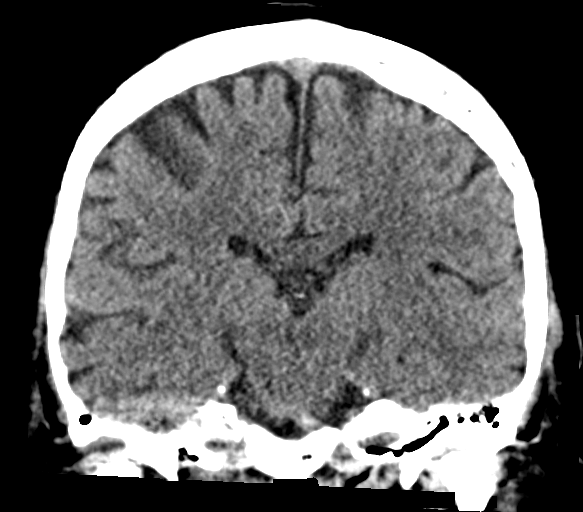
[im 38/69  brain]
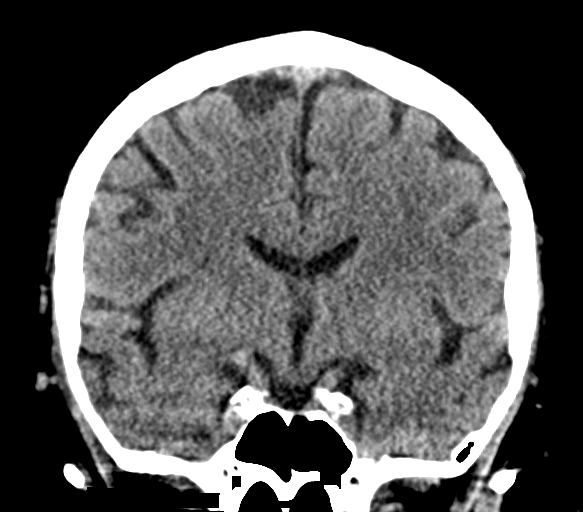

[Series 5: sagittal soft tissue · sagittal · 0.30mm/px · 3 of 61 slices shown]
[im 21/61  brain]
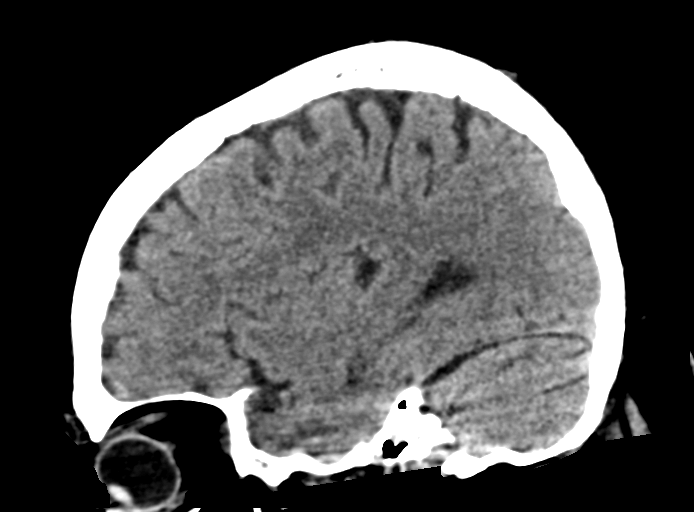
[im 31/61  brain]
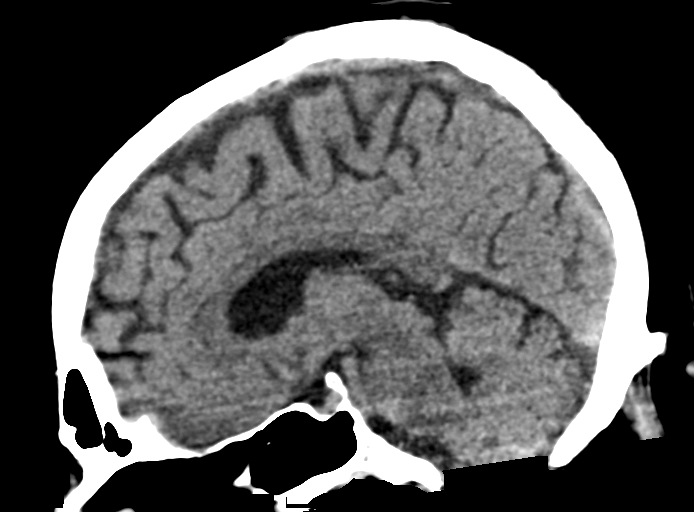
[im 41/61  brain]
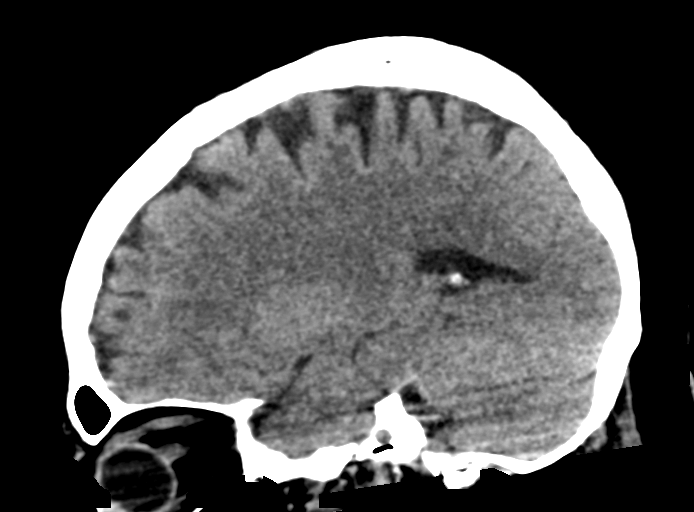

[15 of 47 positions shown; findings below may reference images not displayed]

FINDINGS: Brain: No evidence of acute infarction, hemorrhage, hydrocephalus,
extra-axial collection or mass lesion/mass effect.

Vascular: No hyperdense vessel or unexpected calcification.

Skull: Normal. Negative for fracture or focal lesion.

Sinuses/Orbits: No acute finding.

Other: None.
IMPRESSION: No acute intracranial process.

## 2020-11-05 IMAGING — CR DG CHEST 2V
2 series · 2 of 2 positions shown · non-contrast
Comparison: 03/14/2019

CLINICAL DATA: Shortness of breath, chest pain

EXAM:
CHEST - 2 VIEW

[chest lat]
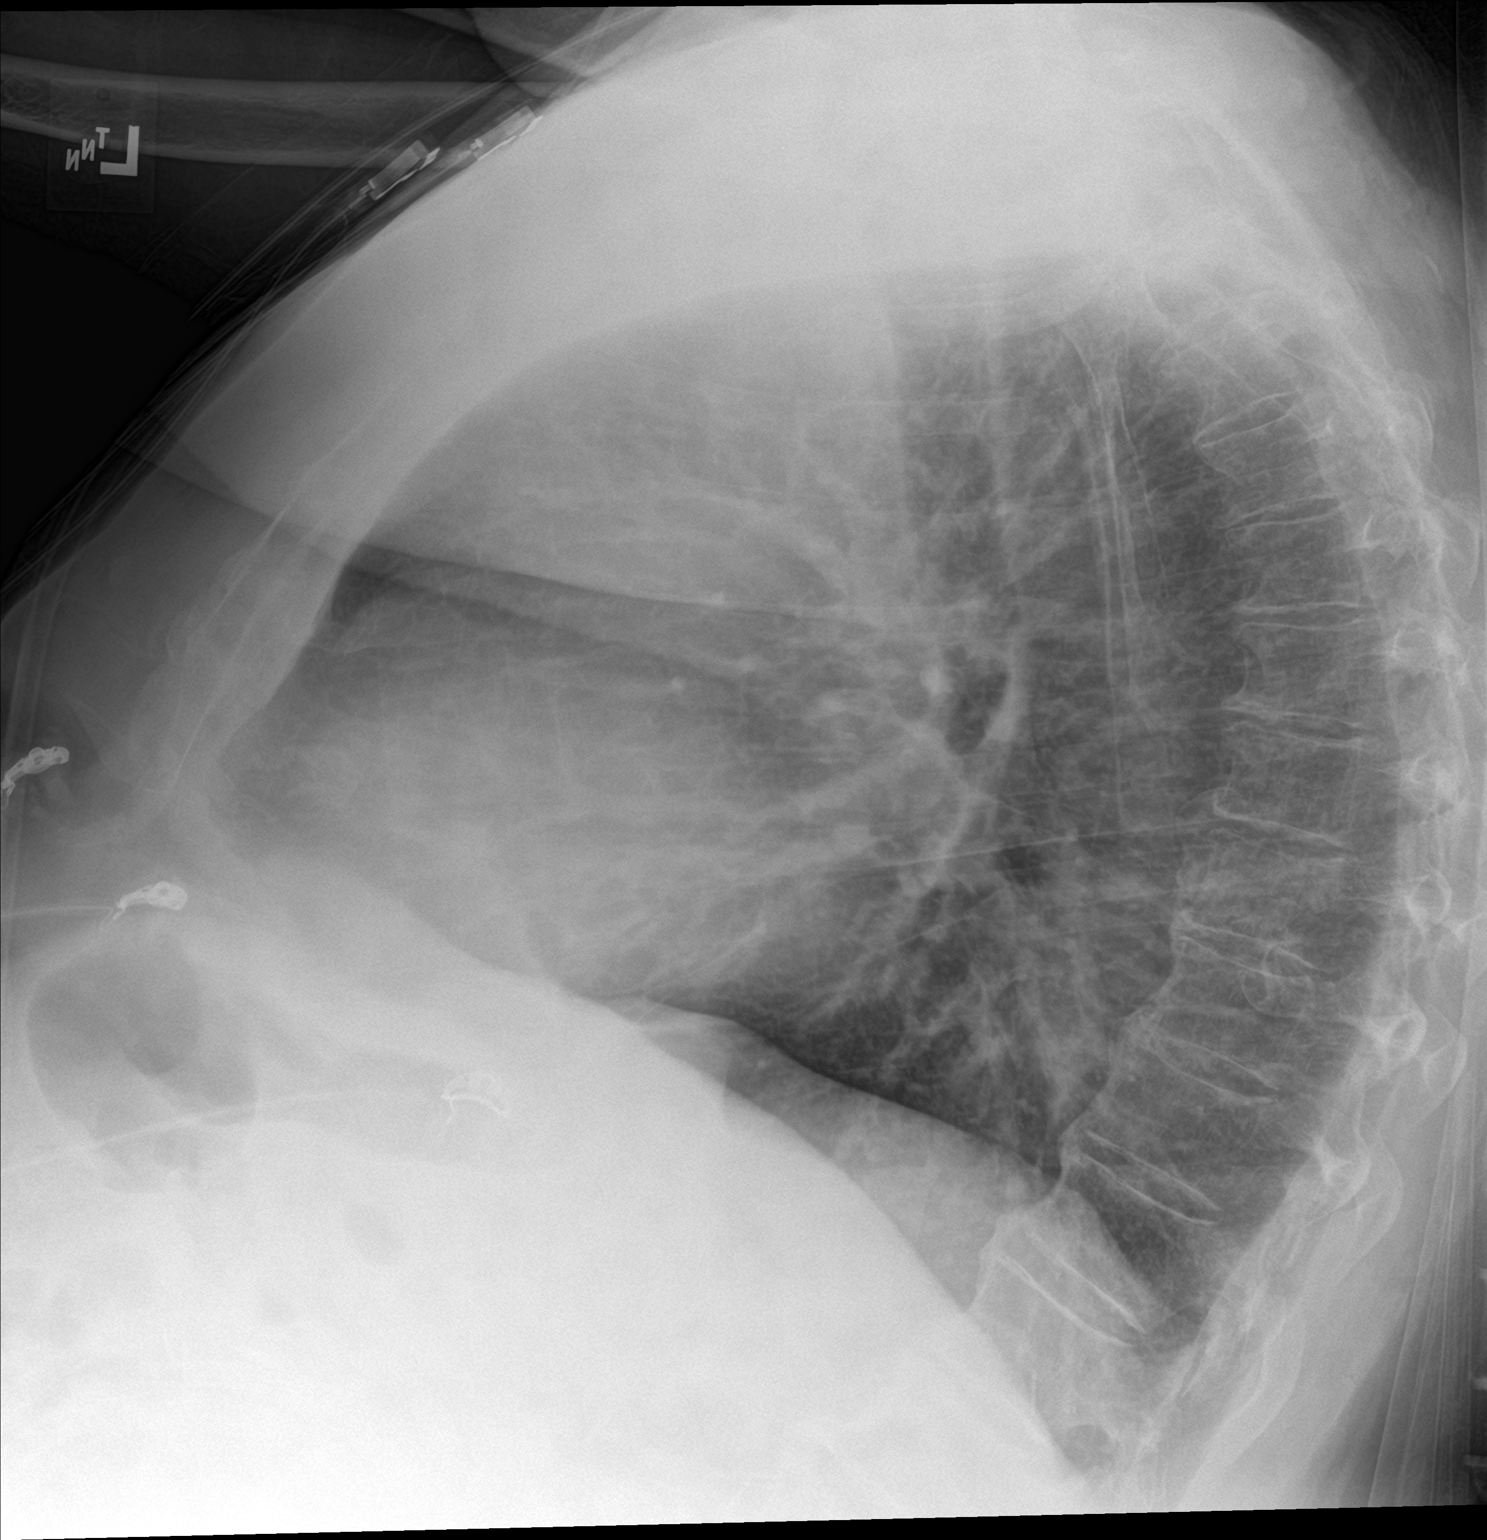

[chest ap]
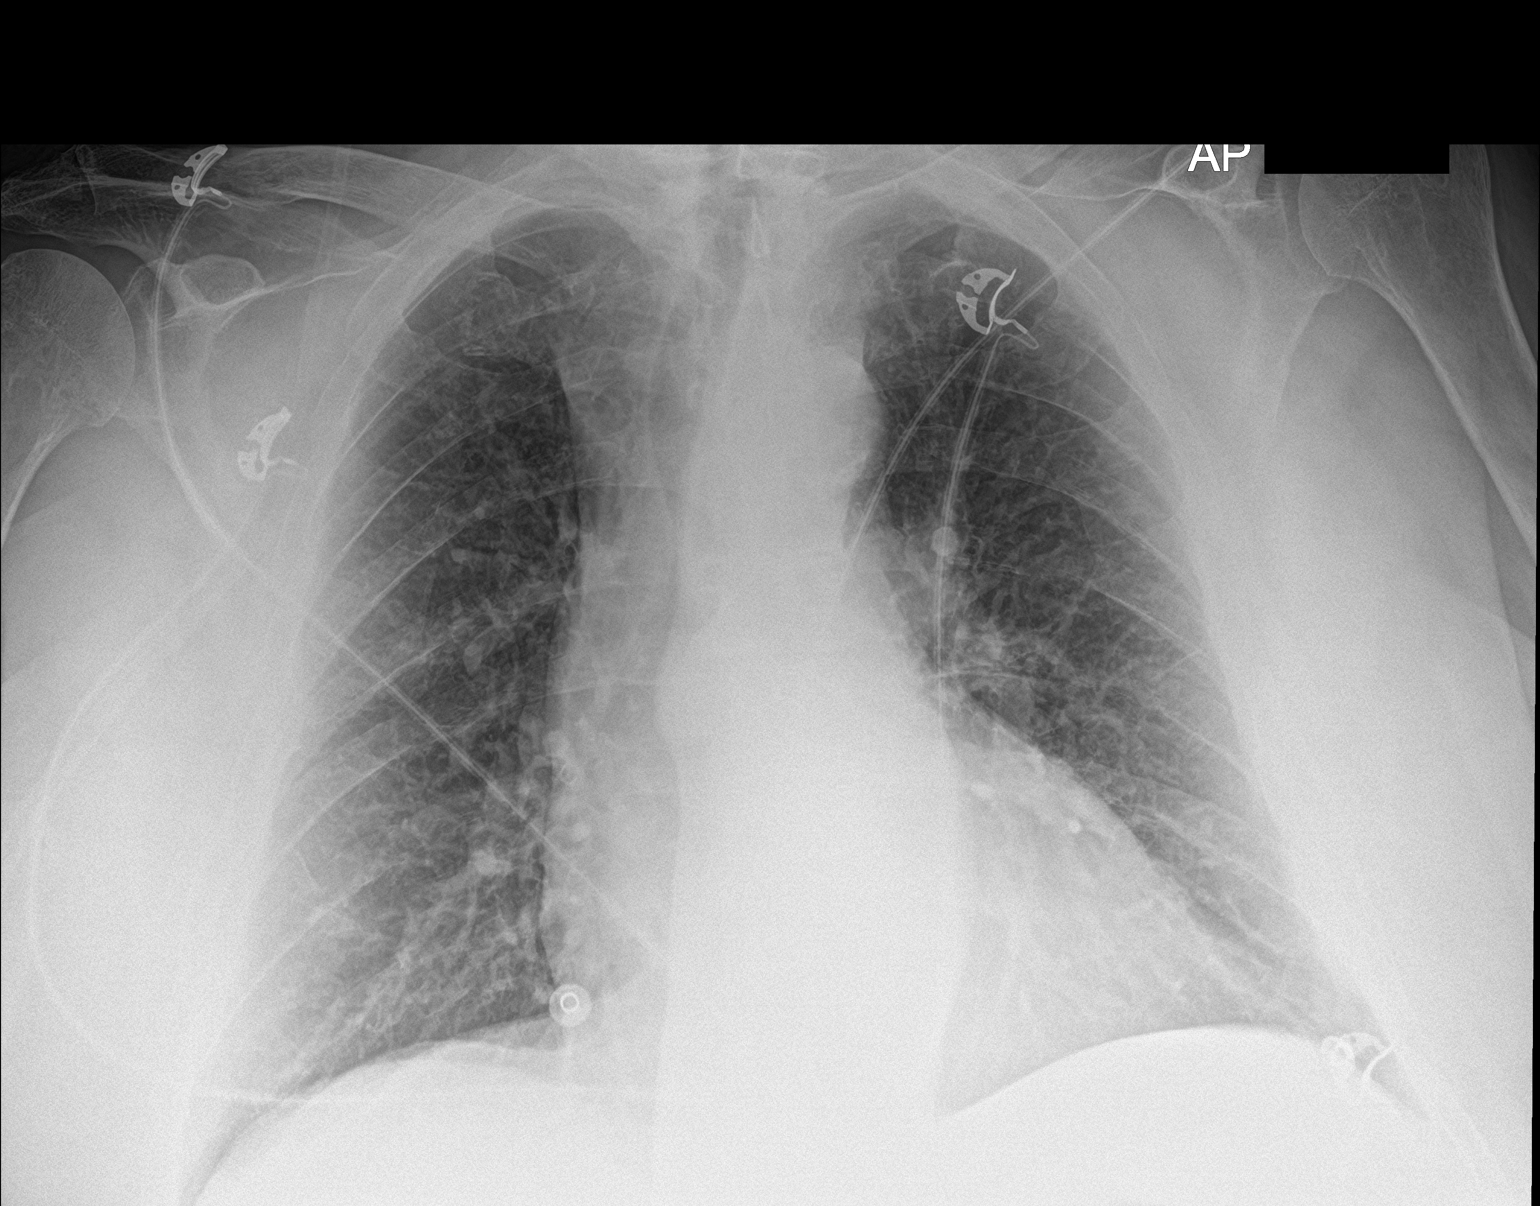

[2 of 2 positions shown; findings below may reference images not displayed]

FINDINGS: Heart is upper limits normal in size. Lungs clear. No effusions or
edema. No acute bony abnormality.
IMPRESSION: No active cardiopulmonary disease.

## 2020-11-10 ENCOUNTER — Other Ambulatory Visit: Payer: Self-pay

## 2020-11-10 MED ORDER — SPIRIVA HANDIHALER 18 MCG IN CAPS: 18.0000 ug | ORAL_CAPSULE | Freq: Every day | RESPIRATORY_TRACT | 1 refills | Status: DC

## 2020-11-11 NOTE — Addendum Note (Signed)
Addended by: Anselm Pancoast on: 11/11/2020 02:54 PM   Modules accepted: Orders

## 2020-11-12 ENCOUNTER — Ambulatory Visit: Payer: Medicare PPO | Admitting: Nurse Practitioner

## 2020-11-13 DIAGNOSIS — E119 Type 2 diabetes mellitus without complications: Secondary | ICD-10-CM | POA: Diagnosis not present

## 2020-11-16 ENCOUNTER — Emergency Department: Payer: Medicare PPO

## 2020-11-16 ENCOUNTER — Observation Stay
Admission: EM | Admit: 2020-11-16 | Discharge: 2020-11-18 | Disposition: A | Discharge status: Home / Self Care | Payer: Medicare PPO | Attending: Internal Medicine | Admitting: Internal Medicine

## 2020-11-16 ENCOUNTER — Encounter: Payer: Self-pay | Admitting: *Deleted

## 2020-11-16 ENCOUNTER — Other Ambulatory Visit: Payer: Self-pay

## 2020-11-16 DIAGNOSIS — K219 Gastro-esophageal reflux disease without esophagitis: Secondary | ICD-10-CM | POA: Diagnosis present

## 2020-11-16 DIAGNOSIS — R52 Pain, unspecified: Secondary | ICD-10-CM

## 2020-11-16 DIAGNOSIS — I5033 Acute on chronic diastolic (congestive) heart failure: Secondary | ICD-10-CM | POA: Diagnosis not present

## 2020-11-16 DIAGNOSIS — Z79899 Other long term (current) drug therapy: Secondary | ICD-10-CM | POA: Insufficient documentation

## 2020-11-16 DIAGNOSIS — Z85528 Personal history of other malignant neoplasm of kidney: Secondary | ICD-10-CM | POA: Insufficient documentation

## 2020-11-16 DIAGNOSIS — I2510 Atherosclerotic heart disease of native coronary artery without angina pectoris: Secondary | ICD-10-CM | POA: Diagnosis not present

## 2020-11-16 DIAGNOSIS — M549 Dorsalgia, unspecified: Secondary | ICD-10-CM | POA: Diagnosis not present

## 2020-11-16 DIAGNOSIS — Z7984 Long term (current) use of oral hypoglycemic drugs: Secondary | ICD-10-CM | POA: Insufficient documentation

## 2020-11-16 DIAGNOSIS — I5032 Chronic diastolic (congestive) heart failure: Secondary | ICD-10-CM | POA: Diagnosis present

## 2020-11-16 DIAGNOSIS — R55 Syncope and collapse: Secondary | ICD-10-CM | POA: Diagnosis not present

## 2020-11-16 DIAGNOSIS — R519 Headache, unspecified: Secondary | ICD-10-CM | POA: Diagnosis not present

## 2020-11-16 DIAGNOSIS — E782 Mixed hyperlipidemia: Secondary | ICD-10-CM

## 2020-11-16 DIAGNOSIS — I130 Hypertensive heart and chronic kidney disease with heart failure and stage 1 through stage 4 chronic kidney disease, or unspecified chronic kidney disease: Secondary | ICD-10-CM | POA: Insufficient documentation

## 2020-11-16 DIAGNOSIS — E1122 Type 2 diabetes mellitus with diabetic chronic kidney disease: Secondary | ICD-10-CM | POA: Diagnosis not present

## 2020-11-16 DIAGNOSIS — E785 Hyperlipidemia, unspecified: Secondary | ICD-10-CM | POA: Diagnosis present

## 2020-11-16 DIAGNOSIS — M1611 Unilateral primary osteoarthritis, right hip: Secondary | ICD-10-CM | POA: Diagnosis not present

## 2020-11-16 DIAGNOSIS — J449 Chronic obstructive pulmonary disease, unspecified: Secondary | ICD-10-CM | POA: Insufficient documentation

## 2020-11-16 DIAGNOSIS — Z7982 Long term (current) use of aspirin: Secondary | ICD-10-CM | POA: Diagnosis not present

## 2020-11-16 DIAGNOSIS — M542 Cervicalgia: Secondary | ICD-10-CM | POA: Diagnosis not present

## 2020-11-16 DIAGNOSIS — I5022 Chronic systolic (congestive) heart failure: Secondary | ICD-10-CM | POA: Diagnosis not present

## 2020-11-16 DIAGNOSIS — S0990XA Unspecified injury of head, initial encounter: Secondary | ICD-10-CM | POA: Diagnosis not present

## 2020-11-16 DIAGNOSIS — E1165 Type 2 diabetes mellitus with hyperglycemia: Secondary | ICD-10-CM

## 2020-11-16 DIAGNOSIS — I10 Essential (primary) hypertension: Secondary | ICD-10-CM | POA: Diagnosis not present

## 2020-11-16 DIAGNOSIS — Z794 Long term (current) use of insulin: Secondary | ICD-10-CM | POA: Diagnosis not present

## 2020-11-16 DIAGNOSIS — Z8673 Personal history of transient ischemic attack (TIA), and cerebral infarction without residual deficits: Secondary | ICD-10-CM | POA: Diagnosis not present

## 2020-11-16 DIAGNOSIS — Z20822 Contact with and (suspected) exposure to covid-19: Secondary | ICD-10-CM | POA: Insufficient documentation

## 2020-11-16 DIAGNOSIS — Z87891 Personal history of nicotine dependence: Secondary | ICD-10-CM | POA: Diagnosis not present

## 2020-11-16 DIAGNOSIS — N1832 Chronic kidney disease, stage 3b: Secondary | ICD-10-CM | POA: Insufficient documentation

## 2020-11-16 DIAGNOSIS — M545 Low back pain, unspecified: Secondary | ICD-10-CM | POA: Diagnosis not present

## 2020-11-16 DIAGNOSIS — R0902 Hypoxemia: Secondary | ICD-10-CM | POA: Diagnosis not present

## 2020-11-16 DIAGNOSIS — Z8616 Personal history of COVID-19: Secondary | ICD-10-CM | POA: Insufficient documentation

## 2020-11-16 HISTORY — DX: Syncope and collapse: R55

## 2020-11-16 LAB — CBC
HCT: 32.7 % — ABNORMAL LOW (ref 39.0–52.0)
Hemoglobin: 10.7 g/dL — ABNORMAL LOW (ref 13.0–17.0)
MCH: 28.4 pg (ref 26.0–34.0)
MCHC: 32.7 g/dL (ref 30.0–36.0)
MCV: 86.7 fL (ref 80.0–100.0)
Platelets: 183 10*3/uL (ref 150–400)
RBC: 3.77 MIL/uL — ABNORMAL LOW (ref 4.22–5.81)
RDW: 13.4 % (ref 11.5–15.5)
WBC: 7.9 10*3/uL (ref 4.0–10.5)
nRBC: 0 % (ref 0.0–0.2)

## 2020-11-16 LAB — BASIC METABOLIC PANEL
Anion gap: 8 (ref 5–15)
BUN: 39 mg/dL — ABNORMAL HIGH (ref 8–23)
CO2: 19 mmol/L — ABNORMAL LOW (ref 22–32)
Calcium: 8.8 mg/dL — ABNORMAL LOW (ref 8.9–10.3)
Chloride: 108 mmol/L (ref 98–111)
Creatinine, Ser: 2 mg/dL — ABNORMAL HIGH (ref 0.61–1.24)
GFR, Estimated: 36 mL/min — ABNORMAL LOW (ref >60)
Glucose, Bld: 103 mg/dL — ABNORMAL HIGH (ref 70–99)
Potassium: 4.1 mmol/L (ref 3.5–5.1)
Sodium: 135 mmol/L (ref 135–145)

## 2020-11-16 LAB — TROPONIN I (HIGH SENSITIVITY)
Troponin I (High Sensitivity): 16 ng/L (ref <18)
Troponin I (High Sensitivity): 19 ng/L — ABNORMAL HIGH (ref <18)

## 2020-11-16 LAB — RESP PANEL BY RT-PCR (FLU A&B, COVID) ARPGX2
Influenza A by PCR: NEGATIVE
Influenza B by PCR: NEGATIVE
SARS Coronavirus 2 by RT PCR: NEGATIVE

## 2020-11-16 LAB — MAGNESIUM: Magnesium: 1.7 mg/dL (ref 1.7–2.4)

## 2020-11-16 MED ORDER — PANTOPRAZOLE SODIUM 40 MG PO TBEC
40.0000 mg | DELAYED_RELEASE_TABLET | Freq: Every day | ORAL | Status: DC
  Administered 2020-11-17 – 2020-11-18 (×2): 40 mg via ORAL
  Filled 2020-11-16 (×2): qty 1

## 2020-11-16 MED ORDER — ALBUTEROL SULFATE (2.5 MG/3ML) 0.083% IN NEBU
3.0000 mL | INHALATION_SOLUTION | Freq: Four times a day (QID) | RESPIRATORY_TRACT | Status: DC | PRN
  Administered 2020-11-17: 20:00:00 3 mL via RESPIRATORY_TRACT

## 2020-11-16 MED ORDER — ISOSORBIDE MONONITRATE ER 60 MG PO TB24
30.0000 mg | ORAL_TABLET | Freq: Two times a day (BID) | ORAL | Status: DC
  Administered 2020-11-17 – 2020-11-18 (×3): 30 mg via ORAL
  Filled 2020-11-16 (×5): qty 1

## 2020-11-16 MED ORDER — POLYETHYLENE GLYCOL 3350 17 G PO PACK: 17.0000 g | PACK | Freq: Every day | ORAL | Status: DC | PRN

## 2020-11-16 MED ORDER — GABAPENTIN 100 MG PO CAPS
100.0000 mg | ORAL_CAPSULE | Freq: Three times a day (TID) | ORAL | Status: DC
Start: 2020-11-16 — End: 2020-11-18
  Administered 2020-11-16 – 2020-11-18 (×5): 100 mg via ORAL
  Filled 2020-11-16 (×5): qty 1

## 2020-11-16 MED ORDER — HYDRALAZINE HCL 50 MG PO TABS
100.0000 mg | ORAL_TABLET | Freq: Three times a day (TID) | ORAL | Status: DC
  Administered 2020-11-17 – 2020-11-18 (×4): 100 mg via ORAL
  Filled 2020-11-16 (×4): qty 2

## 2020-11-16 MED ORDER — OXYCODONE-ACETAMINOPHEN 5-325 MG PO TABS
1.0000 | ORAL_TABLET | Freq: Once | ORAL | Status: AC
  Administered 2020-11-16: 1 via ORAL
  Filled 2020-11-16: qty 1

## 2020-11-16 MED ORDER — ACETAMINOPHEN 650 MG RE SUPP
650.0000 mg | Freq: Four times a day (QID) | RECTAL | Status: DC | PRN
Start: 2020-11-16 — End: 2020-11-18

## 2020-11-16 MED ORDER — ASPIRIN EC 81 MG PO TBEC
81.0000 mg | DELAYED_RELEASE_TABLET | Freq: Every day | ORAL | Status: DC
  Administered 2020-11-17 – 2020-11-18 (×2): 81 mg via ORAL
  Filled 2020-11-16 (×2): qty 1

## 2020-11-16 MED ORDER — FUROSEMIDE 40 MG PO TABS
40.0000 mg | ORAL_TABLET | Freq: Every day | ORAL | Status: DC
  Administered 2020-11-17 – 2020-11-18 (×2): 40 mg via ORAL
  Filled 2020-11-16 (×2): qty 1

## 2020-11-16 MED ORDER — ACETAMINOPHEN 325 MG PO TABS
650.0000 mg | ORAL_TABLET | Freq: Once | ORAL | Status: AC
  Administered 2020-11-16: 650 mg via ORAL
  Filled 2020-11-16: qty 2

## 2020-11-16 MED ORDER — TIOTROPIUM BROMIDE MONOHYDRATE 18 MCG IN CAPS
18.0000 ug | ORAL_CAPSULE | Freq: Every day | RESPIRATORY_TRACT | Status: DC
  Administered 2020-11-17 – 2020-11-18 (×2): 18 ug via RESPIRATORY_TRACT
  Filled 2020-11-16: qty 5

## 2020-11-16 MED ORDER — INSULIN ASPART 100 UNIT/ML IJ SOLN
0.0000 [IU] | Freq: Three times a day (TID) | INTRAMUSCULAR | Status: DC
  Administered 2020-11-17: 5 [IU] via SUBCUTANEOUS
  Administered 2020-11-18: 09:00:00 1 [IU] via SUBCUTANEOUS
  Filled 2020-11-16 (×2): qty 1

## 2020-11-16 MED ORDER — ENOXAPARIN SODIUM 60 MG/0.6ML IJ SOSY
60.0000 mg | PREFILLED_SYRINGE | INTRAMUSCULAR | Status: DC
  Administered 2020-11-16 – 2020-11-17 (×2): 60 mg via SUBCUTANEOUS
  Filled 2020-11-16: qty 0.6

## 2020-11-16 MED ORDER — SODIUM CHLORIDE 0.9% FLUSH
3.0000 mL | Freq: Two times a day (BID) | INTRAVENOUS | Status: DC
  Administered 2020-11-16 – 2020-11-18 (×4): 3 mL via INTRAVENOUS

## 2020-11-16 MED ORDER — ACETAMINOPHEN 325 MG PO TABS
650.0000 mg | ORAL_TABLET | Freq: Four times a day (QID) | ORAL | Status: DC | PRN
  Administered 2020-11-17: 16:00:00 650 mg via ORAL
  Filled 2020-11-16: qty 2

## 2020-11-16 MED ORDER — CARVEDILOL 6.25 MG PO TABS
6.2500 mg | ORAL_TABLET | Freq: Two times a day (BID) | ORAL | Status: DC
  Administered 2020-11-17 – 2020-11-18 (×3): 6.25 mg via ORAL
  Filled 2020-11-16 (×3): qty 1

## 2020-11-16 MED ORDER — INSULIN GLARGINE 100 UNIT/ML ~~LOC~~ SOLN
15.0000 [IU] | Freq: Every day | SUBCUTANEOUS | Status: DC
  Administered 2020-11-17 – 2020-11-18 (×2): 15 [IU] via SUBCUTANEOUS
  Filled 2020-11-16 (×3): qty 0.15

## 2020-11-16 MED ORDER — MOMETASONE FURO-FORMOTEROL FUM 200-5 MCG/ACT IN AERO
2.0000 | INHALATION_SPRAY | Freq: Two times a day (BID) | RESPIRATORY_TRACT | Status: DC
  Administered 2020-11-17 – 2020-11-18 (×3): 2 via RESPIRATORY_TRACT
  Filled 2020-11-16: qty 8.8

## 2020-11-16 MED ORDER — ISOSORBIDE MONONITRATE ER 60 MG PO TB24: 30.0000 mg | ORAL_TABLET | Freq: Two times a day (BID) | ORAL | Status: DC

## 2020-11-16 NOTE — ED Notes (Signed)
Pt ate dinner tray x2

## 2020-11-16 NOTE — ED Provider Notes (Signed)
[facility] Emergency Department Provider Note ____________________________________________   Event Date/Time   First MD Initiated Contact with Patient 11/16/20 1617     (approximate)  I have reviewed the triage vital signs and the nursing notes.   HISTORY  Chief Complaint Near Syncope    HPI Larry Callahan is a 65 y.o. male with PMH as noted below including COPD, CHF, CAD, hypertension, and GERD who presents with syncope, cute onset this afternoon.  The patient states that he went shopping at the grocery store and then went to a fast food place.  Subsequently he suddenly "blacked out" and believes he did not fully lose consciousness.  He fell and hit his head.  He reports pain to his head, neck, low back, and left hip and knee.  He states he then went home but continued to have pain and so decided to come to the hospital.  He states that he did not have any lightheadedness, weakness, or other prodrome.  This has not happened to him before.  He denies any chest pain or shortness of breath, vomiting or diarrhea, or other acute symptoms  Past Medical History:  Diagnosis Date  . Allergy   . COPD (chronic obstructive pulmonary disease) ([place])   . Coronary artery disease   . Diabetes mellitus without complication ([place])   . Diastolic CHF ([place])   . GERD (gastroesophageal reflux disease)   . Hyperlipidemia   . Hypertension   . Renal insufficiency   . Sleep apnea     Patient Active Problem List   Diagnosis Date Noted  . Demand ischemia ([place])   . Influenza A 10/30/2020  . Chest pain 10/06/2020  . HLD (hyperlipidemia) 10/06/2020  . CVA (cerebral vascular accident) ([place]) 09/25/2020  . Gastroesophageal reflux disease without esophagitis 04/19/2020  . Pneumonia due to COVID-19 virus 01/30/2020  . Elevated troponin I level 01/29/2020  . Suspected COVID-19 virus infection 01/29/2020  . Postural dizziness with presyncope 01/29/2020  . Hematemesis  01/09/2020  . Hospital discharge follow-up 11/26/2019  . Cellulitis of left hand 09/26/2019  . Generalized weakness 08/08/2019  . Obesity (BMI 30-39.9) 08/08/2019  . Acute right hip pain 06/17/2019  . Inability to ambulate due to hip 06/17/2019  . CKD stage 3 due to type 2 diabetes mellitus ([place])   . Hypervolemia   . Acute on chronic diastolic CHF (congestive heart failure) ([place])   . Type 2 diabetes mellitus with stage 3b chronic kidney disease, with long-term current use of insulin ([place])   . Hypomagnesemia   . Full code status 05/20/2019  . Pleuritic chest pain   . Multifocal pneumonia 05/19/2019  . Acute kidney injury superimposed on CKD 3b ([place]) 05/09/2019  . Hemoptysis 04/09/2019  . Shortness of breath 03/26/2019  . Uncontrolled type 2 diabetes mellitus with insulin therapy ([place]) 03/12/2019  . Acute on chronic heart failure with preserved ejection fraction (HFpEF) ([place])   . Chronic kidney disease, stage 3b ([place])   . Chronic systolic CHF (congestive heart failure) ([place]) 03/01/2019  . Diastolic dysfunction 37/16/9678  . Iron deficiency anemia 12/31/2018  . Atopic dermatitis 11/24/2018  . Screening for colon cancer 11/06/2017  . Uncontrolled type 2 diabetes mellitus with hyperglycemia ([place]) 11/06/2017  . Hidradenitis 06/09/2017  . Atherosclerotic heart disease of native coronary artery without angina pectoris 06/09/2017  . 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 Obstructive sleep apnea, adult 06/09/2017  . Morbid (severe) obesity due to excess calories ([place]) 06/09/2017  .  Cigarette nicotine dependence with nicotine-induced disorder 06/09/2017  . Diabetic polyneuropathy associated with type 2 diabetes mellitus ([place]) 06/09/2017  . Allergic rhinitis due to pollen 06/09/2017  . Mixed hyperlipidemia 06/09/2017  . COPD (chronic obstructive pulmonary disease) ([place]) 06/09/2017  . Dyspnea on exertion 06/09/2017  . Wheezing 06/09/2017  . Dysuria  06/09/2017  . Snoring 06/09/2017  . Essential hypertension 06/09/2017  . Personal history of other malignant neoplasm of kidney 06/09/2017  . Pain in right hip 06/09/2017  . Impacted cerumen, bilateral 06/09/2017  . Secondary hyperparathyroidism, not elsewhere classified ([place]) 06/09/2017  . Type 2 diabetes mellitus with hyperglycemia ([place]) 06/09/2017  . Tinea corporis 06/09/2017    Past Surgical History:  Procedure Laterality Date  . BACK SURGERY    . CARDIAC CATHETERIZATION    . CHOLECYSTECTOMY    . COLONOSCOPY WITH PROPOFOL N/A 04/12/2019   Procedure: COLONOSCOPY WITH PROPOFOL;  Surgeon: [name], MD;  Location: [facility] ENDOSCOPY;  Service: Gastroenterology;  Laterality: N/A;  . ESOPHAGOGASTRODUODENOSCOPY N/A 04/12/2019   Procedure: ESOPHAGOGASTRODUODENOSCOPY (EGD);  Surgeon: [name], MD;  Location: [facility] ENDOSCOPY;  Service: Gastroenterology;  Laterality: N/A;  . ESOPHAGOGASTRODUODENOSCOPY (EGD) WITH PROPOFOL N/A 11/21/2019   Procedure: ESOPHAGOGASTRODUODENOSCOPY (EGD) WITH PROPOFOL;  Surgeon: [name], MD;  Location: [facility] ENDOSCOPY;  Service: Gastroenterology;  Laterality: N/A;  . FLEXIBLE BRONCHOSCOPY Bilateral 05/17/2019   Procedure: FLEXIBLE BRONCHOSCOPY;  Surgeon: [name], MD;  Location: [name] ORS;  Service: Pulmonary;  Laterality: Bilateral;  . left arm surgery    . nephrectomy Left   . PARATHYROIDECTOMY    . RIGHT HEART CATH N/A 03/29/2019   Procedure: RIGHT HEART CATH;  Surgeon: [name], MD;  Location: [place] CV LAB;  Service: Cardiovascular;  Laterality: N/A;    Prior to Admission medications   Medication Sig Start Date End Date Taking? Authorizing Provider  albuterol (VENTOLIN HFA) 108 (90 Base) MCG/ACT inhaler Inhale 2 puffs into the lungs every 6 (six) hours as needed for wheezing or shortness of breath. 08/31/20   Lavera Guise, MD  aspirin EC 81 MG EC tablet Take 1 tablet (81 mg total) by mouth daily. Swallow whole.  11/01/20   Lorella Nimrod, MD  Blood Glucose Calibration (TRUE METRIX LEVEL 1) Low SOLN Use as directed 04/04/18   [name], NP  Blood Glucose Monitoring Suppl (TRUE METRIX AIR GLUCOSE METER) DEVI 1 Device by Does not apply route 2 (two) times daily. 04/13/18   [name], NP  budesonide-formoterol (SYMBICORT) 160-4.5 MCG/ACT inhaler Inhale 2 puffs into the lungs 2 (two) times daily. 07/20/20   Lavera Guise, MD  carvedilol (COREG) 6.25 MG tablet Take 1 tablet (6.25 mg total) by mouth 2 (two) times daily with a meal. 10/08/20   Jennye Boroughs, MD  cetirizine (ZYRTEC) 10 MG tablet Take 10 mg by mouth daily.    [provider]  escitalopram (LEXAPRO) 10 MG tablet Take one tab po qd for anxiety with food Patient taking differently: Take 10 mg by mouth daily. Take one tab po qd for anxiety with food 07/17/20   Lavera Guise, MD  ferrous gluconate (FERGON) 324 MG tablet Take 1 tablet (324 mg total) by mouth daily with breakfast. 08/01/19   [name], NP  furosemide (LASIX) 40 MG tablet Take one to tabs once or twice days for fluid // swelling 07/17/20   Lavera Guise, MD  gabapentin (NEURONTIN) 100 MG capsule Take 1 capsule (100 mg total) by mouth in  the morning, at noon, and at bedtime. 07/17/20   Lavera Guise, MD  glimepiride Jari Sportsman) 2 MG tablet Take one tab po qd with supper 07/17/20   Lavera Guise, MD  glucose blood (TRUE METRIX BLOOD GLUCOSE TEST) test strip Use as instructed twice daily diag E11.65 07/30/19   [name], NP  hydrALAZINE (APRESOLINE) 100 MG tablet Take 1 tablet (100 mg total) by mouth 3 (three) times daily. 07/17/20   Lavera Guise, MD  insulin glargine, 1 Unit Dial, (TOUJEO) 300 UNIT/ML Solostar Pen Inject 26 Units into the skin daily. 04/30/20   [name], NP  ipratropium-albuterol (DUONEB) 0.5-2.5 (3) MG/3ML SOLN Take 3 mLs by nebulization every 6 (six) hours as needed. 10/11/20   Lavera Guise, MD  isosorbide mononitrate (IMDUR) 30 MG 24 hr tablet  Take 1 tablet (30 mg total) by mouth 2 (two) times daily. 10/08/20   Jennye Boroughs, MD  mupirocin ointment (BACTROBAN) 2 % Apply 1 application topically daily. Qd to excision site 10/06/20   Ralene Bathe, MD  NOVOFINE PEN NEEDLE 32G X 6 MM MISC Use as directed 10/15/20   Lavera Guise, MD  omeprazole (PRILOSEC) 40 MG capsule Take 1 capsule (40 mg total) by mouth in the morning and at bedtime. 07/17/20   Lavera Guise, MD  potassium chloride SA (KLOR-CON) 20 MEQ tablet Take 1 tablet (20 mEq total) by mouth 2 (two) times daily. 07/17/20   Lavera Guise, MD  tiotropium (SPIRIVA HANDIHALER) 18 MCG inhalation capsule Place 1 capsule (18 mcg total) into inhaler and inhale daily. 11/10/20 02/08/21  Lavera Guise, MD  TRUEplus Lancets 33G MISC Use as directed twice daily diag E11.65 07/30/19   [name], NP    Allergies Penicillins  Family History  Problem Relation Age of Onset  . Diabetes Mother        2003  . Lung cancer Father   . Diabetes Sister   . Hypertension Sister   . Heart disease Sister   . Cancer Sister   . Bone cancer Brother     Social History Social History   Tobacco Use  . Smoking status: Former Smoker    Types: Cigarettes  . Smokeless tobacco: Former Systems developer    Types: Secondary school teacher  . Vaping Use: Never used  Substance Use Topics  . Alcohol use: No  . Drug use: No    Review of Systems  Constitutional: No fever. Eyes: No redness. ENT: No sore throat. Cardiovascular: Denies chest pain. Respiratory: Denies shortness of breath. Gastrointestinal: No vomiting or diarrhea.  Genitourinary: Negative for flank pain. Musculoskeletal: Positive for back pain. Skin: Negative for rash. Neurological: Positive for headache.  Negative for weakness or numbness.   ____________________________________________   PHYSICAL EXAM:  VITAL SIGNS: ED Triage Vitals  Enc Vitals Group     BP 11/16/20 1614 (!) 182/85     Pulse Rate 11/16/20 1614 87     Resp 11/16/20 1614 20      Temp 11/16/20 1614 99 F (37.2 C)     Temp Source 11/16/20 1614 Oral     SpO2 11/16/20 1614 98 %     Weight 11/16/20 1611 270 lb (122.5 kg)     Height 11/16/20 1611 _0  (1.803 m)     Head Circumference --      Peak Flow --      Pain Score 11/16/20 1611 5     Pain Loc --  Pain Edu? --      Excl. in [place]? --     Constitutional: Alert and oriented. Well appearing and in no acute distress. Eyes: Conjunctivae are normal.  EOMI.  PERRLA. Head: Atraumatic. Nose: No congestion/rhinnorhea. Mouth/Throat: Mucous membranes are moist.   Neck: Normal range of motion.  Cervical paraspinal muscle tenderness with no midline tenderness, step-off, or crepitus. Cardiovascular: Normal rate, regular rhythm. Grossly normal heart sounds.  Good peripheral circulation. Respiratory: Normal respiratory effort.  No retractions. Lungs CTAB. Gastrointestinal: No distention.  Genitourinary: No CVA tenderness. Musculoskeletal: No lower extremity edema.  Extremities warm and well perfused.  Full range of motion to bilateral hips and knees.  Mild lumbar midline spinal tenderness. Neurologic:  Normal speech and language.  Motor and sensory intact in all extremities.  Normal coordination with no ataxia. Skin:  Skin is warm and dry. No rash noted. Psychiatric: Mood and affect are normal. Speech and behavior are normal.  ____________________________________________   LABS (all labs ordered are listed, but only abnormal results are displayed)  Labs Reviewed  BASIC METABOLIC PANEL - Abnormal; Notable for the following components:      Result Value   CO2 19 (*)    Glucose, Bld 103 (*)    BUN 39 (*)    Creatinine, Ser 2.00 (*)    Calcium 8.8 (*)    GFR, Estimated 36 (*)    All other components within normal limits  CBC - Abnormal; Notable for the following components:   RBC 3.77 (*)    Hemoglobin 10.7 (*)    HCT 32.7 (*)    All other components within normal limits  TROPONIN I (HIGH SENSITIVITY) -  Abnormal; Notable for the following components:   Troponin I (High Sensitivity) 19 (*)    All other components within normal limits  RESP PANEL BY RT-PCR (FLU A&B, COVID) [id number]  TROPONIN I (HIGH SENSITIVITY)   ____________________________________________  EKG  ED ECG REPORT I, Arta Silence, the attending physician, personally viewed and interpreted this ECG.  Date: 11/16/2020 EKG Time: 1617 Rate: 85 Rhythm: normal sinus rhythm QRS Axis: normal Intervals: normal ST/T Wave abnormalities: normal Narrative Interpretation: no evidence of acute ischemia   ____________________________________________  RADIOLOGY  Chest x-ray interpreted by me shows no focal consolidation or edema XR R hip: No acute fracture CT head: No ICH CT cervical spine: No acute fracture CT lumbar spine: No acute fracture   ____________________________________________   PROCEDURES  Procedure(s) performed: No  Procedures  Critical Care performed: No ____________________________________________   INITIAL IMPRESSION / ASSESSMENT AND PLAN / ED COURSE  Pertinent labs & imaging results that were available during my care of the patient were reviewed by me and considered in my medical decision making (see chart for details).  65 year old male with PMH as noted above including COPD, CHF, CAD, hypertension, and GERD presents after a syncopal event with no prodrome.  He fell and states he hit his head.  He also reports pain to his neck, lower back, and initially reported left hip pain although subsequently stated it was his right hip.  He has been able to ambulate.  On exam, the patient is overall well-appearing.  His vital signs are normal except for mild hypertension.  There is no cervical or thoracic midline tenderness.  There is mild lumbar midline spinal tenderness.  He has full range of motion to all large joints.  Neurologic exam is nonfocal.  Differential includes vasovagal syncope,  dehydration, other metabolic abnormality, cardiac arrhythmia, other cardiac  etiology, or less likely CNS cause.  We will obtain a CT head, cervical spine, lumbar spine, x-ray of the right hip, lab work-up, and reassess.  ----------------------------------------- 9:00 PM on 11/16/2020 -----------------------------------------  Imaging is negative for acute findings.  The lab work-up is reassuring.  However, given the syncope with no prodrome I recommended admission for observation and cardiac monitoring.  The patient agrees with this plan.  I consulted Dr. Trilby Drummer from the hospitalist service for admission.  ____________________________________________   FINAL CLINICAL IMPRESSION(S) / ED DIAGNOSES  Final diagnoses:  Syncope, unspecified syncope type      NEW MEDICATIONS STARTED DURING THIS VISIT:  New Prescriptions   No medications on file     Note:  This document was prepared using Dragon voice recognition software and may include unintentional dictation errors.    Arta Silence, MD 11/16/20 2100

## 2020-11-16 NOTE — H&P (Signed)
History and Physical   Elver Stadler Pimenta WER:[id number] DOB: 1956/03/09 DOA: 11/16/2020  PCP: Lavera Guise, MD   Patient coming from: Home  Chief Complaint: Syncope  HPI: Larry Callahan is a 65 y.o. male with medical history significant of CKD 3B, CHF, COPD, CVA, diabetes, hypertension, GERD, hyperlipidemia, anemia, obesity, history of postural dizziness and presyncope, who presents with episode of syncope and pain.  Patient had episode of syncope earlier today he had been out doing errands and was at [facility] when he had an episode of syncope where he fell and hit his head.  He denies any prodrome.  He had walked around to the window as the door was locked and had an episode of syncope there.  He did hit his head as above, but needed to get some groceries home and take care of his cat. He initially felt okay and went home but then began to have head neck and hip pain and came to the ED for further evaluation.  He is able to ambulate.  He has a prior history of postural dizziness and presyncope listed in chart.  However he did not have any prodrome as above. Denies fevers, chills, chest pain, shortness of breath, abdominal pain, constipation, diarrhea, nausea, vomiting.  ED Course: Vital signs in the ED significant for blood pressure in the 195K to 932I systolic, heart rate in the 70s to 90s, respiratory rate in the teens to 20s.  Lab work-up showed BMP with BUN stable at 39 and creatinine stable at 2, bicarb 19, calcium 8.8.  CBC with hemoglobin stable at 10.7.  Troponin 16 and 19 on repeat.  Respiratory panel for flu and COVID pending.  Imaging work-up included chest x-ray which showed no acute abnormality, CT head which showed no acute abnormality, CT C-spine which showed degenerative disc disease but no acute abnormality, CT L-spine which showed stable chronic changes but no acute abnormality.  Hip x-ray on the right side which showed no acute abnormality but did demonstrate  osteoarthritis.  Patient received dose of Tylenol and oxycodone in the ED.  Review of Systems: As per HPI otherwise all other systems reviewed and are negative.  Past Medical History:  Diagnosis Date  . Allergy   . COPD (chronic obstructive pulmonary disease) ([place])   . Coronary artery disease   . Diabetes mellitus without complication ([place])   . Diastolic CHF ([place])   . GERD (gastroesophageal reflux disease)   . Hyperlipidemia   . Hypertension   . Renal insufficiency   . Sleep apnea     Past Surgical History:  Procedure Laterality Date  . BACK SURGERY    . CARDIAC CATHETERIZATION    . CHOLECYSTECTOMY    . COLONOSCOPY WITH PROPOFOL N/A 04/12/2019   Procedure: COLONOSCOPY WITH PROPOFOL;  Surgeon: [name], MD;  Location: [facility] ENDOSCOPY;  Service: Gastroenterology;  Laterality: N/A;  . ESOPHAGOGASTRODUODENOSCOPY N/A 04/12/2019   Procedure: ESOPHAGOGASTRODUODENOSCOPY (EGD);  Surgeon: [name], MD;  Location: [facility] ENDOSCOPY;  Service: Gastroenterology;  Laterality: N/A;  . ESOPHAGOGASTRODUODENOSCOPY (EGD) WITH PROPOFOL N/A 11/21/2019   Procedure: ESOPHAGOGASTRODUODENOSCOPY (EGD) WITH PROPOFOL;  Surgeon: [name], MD;  Location: [facility] ENDOSCOPY;  Service: Gastroenterology;  Laterality: N/A;  . FLEXIBLE BRONCHOSCOPY Bilateral 05/17/2019   Procedure: FLEXIBLE BRONCHOSCOPY;  Surgeon: [name], MD;  Location: [name] ORS;  Service: Pulmonary;  Laterality: Bilateral;  . left arm surgery    . nephrectomy Left   . PARATHYROIDECTOMY    . RIGHT HEART CATH N/A  03/29/2019   Procedure: RIGHT HEART CATH;  Surgeon: [name], MD;  Location: [place] CV LAB;  Service: Cardiovascular;  Laterality: N/A;    Social History  reports that he has quit smoking. His smoking use included cigarettes. He has quit using smokeless tobacco.  His smokeless tobacco use included chew. He reports that he does not drink alcohol and does not use drugs.  Allergies  Allergen  Reactions  . Penicillins Rash    Family History  Problem Relation Age of Onset  . Diabetes Mother        2003  . Lung cancer Father   . Diabetes Sister   . Hypertension Sister   . Heart disease Sister   . Cancer Sister   . Bone cancer Brother   Reviewed on admission  Prior to Admission medications   Medication Sig Start Date End Date Taking? Authorizing Provider  albuterol (VENTOLIN HFA) 108 (90 Base) MCG/ACT inhaler Inhale 2 puffs into the lungs every 6 (six) hours as needed for wheezing or shortness of breath. 08/31/20   Lavera Guise, MD  aspirin EC 81 MG EC tablet Take 1 tablet (81 mg total) by mouth daily. Swallow whole. 11/01/20   Lorella Nimrod, MD  Blood Glucose Calibration (TRUE METRIX LEVEL 1) Low SOLN Use as directed 04/04/18   [name], NP  Blood Glucose Monitoring Suppl (TRUE METRIX AIR GLUCOSE METER) DEVI 1 Device by Does not apply route 2 (two) times daily. 04/13/18   [name], NP  budesonide-formoterol (SYMBICORT) 160-4.5 MCG/ACT inhaler Inhale 2 puffs into the lungs 2 (two) times daily. 07/20/20   Lavera Guise, MD  carvedilol (COREG) 6.25 MG tablet Take 1 tablet (6.25 mg total) by mouth 2 (two) times daily with a meal. 10/08/20   Jennye Boroughs, MD  cetirizine (ZYRTEC) 10 MG tablet Take 10 mg by mouth daily.    [provider]  escitalopram (LEXAPRO) 10 MG tablet Take one tab po qd for anxiety with food Patient taking differently: Take 10 mg by mouth daily. Take one tab po qd for anxiety with food 07/17/20   Lavera Guise, MD  ferrous gluconate (FERGON) 324 MG tablet Take 1 tablet (324 mg total) by mouth daily with breakfast. 08/01/19   [name], NP  furosemide (LASIX) 40 MG tablet Take one to tabs once or twice days for fluid // swelling 07/17/20   Lavera Guise, MD  gabapentin (NEURONTIN) 100 MG capsule Take 1 capsule (100 mg total) by mouth in the morning, at noon, and at bedtime. 07/17/20   Lavera Guise, MD  glimepiride Jari Sportsman) 2 MG tablet  Take one tab po qd with supper 07/17/20   Lavera Guise, MD  glucose blood (TRUE METRIX BLOOD GLUCOSE TEST) test strip Use as instructed twice daily diag E11.65 07/30/19   [name], NP  hydrALAZINE (APRESOLINE) 100 MG tablet Take 1 tablet (100 mg total) by mouth 3 (three) times daily. 07/17/20   Lavera Guise, MD  insulin glargine, 1 Unit Dial, (TOUJEO) 300 UNIT/ML Solostar Pen Inject 26 Units into the skin daily. 04/30/20   [name], NP  ipratropium-albuterol (DUONEB) 0.5-2.5 (3) MG/3ML SOLN Take 3 mLs by nebulization every 6 (six) hours as needed. 10/11/20   Lavera Guise, MD  isosorbide mononitrate (IMDUR) 30 MG 24 hr tablet Take 1 tablet (30 mg total) by mouth 2 (two) times daily. 10/08/20   Jennye Boroughs, MD  mupirocin ointment (BACTROBAN) 2 %  Apply 1 application topically daily. Qd to excision site 10/06/20   Ralene Bathe, MD  NOVOFINE PEN NEEDLE 32G X 6 MM MISC Use as directed 10/15/20   Lavera Guise, MD  omeprazole (PRILOSEC) 40 MG capsule Take 1 capsule (40 mg total) by mouth in the morning and at bedtime. 07/17/20   Lavera Guise, MD  potassium chloride SA (KLOR-CON) 20 MEQ tablet Take 1 tablet (20 mEq total) by mouth 2 (two) times daily. 07/17/20   Lavera Guise, MD  tiotropium (SPIRIVA HANDIHALER) 18 MCG inhalation capsule Place 1 capsule (18 mcg total) into inhaler and inhale daily. 11/10/20 02/08/21  Lavera Guise, MD  TRUEplus Lancets 33G MISC Use as directed twice daily diag E11.65 07/30/19   [name], NP    Physical Exam: Vitals:   11/16/20 1830 11/16/20 1845 11/16/20 1900 11/16/20 1915  BP:   (!) 163/72   Pulse: (!) 104 (!) 103 (!) 109 89  Resp: 13 15 (!) 21 18  Temp:      TempSrc:      SpO2: 97% 96% 93% 96%  Weight:      Height:       Physical Exam Constitutional:      General: He is not in acute distress.    Appearance: Normal appearance. He is obese.  HENT:     Head: Normocephalic and atraumatic.     Mouth/Throat:     Mouth: Mucous membranes are  moist.     Pharynx: Oropharynx is clear.  Eyes:     Extraocular Movements: Extraocular movements intact.     Pupils: Pupils are equal, round, and reactive to light.  Cardiovascular:     Rate and Rhythm: Normal rate and regular rhythm.     Pulses: Normal pulses.     Heart sounds: Normal heart sounds.  Pulmonary:     Effort: Pulmonary effort is normal. No respiratory distress.     Breath sounds: Normal breath sounds.  Abdominal:     General: Bowel sounds are normal. There is no distension.     Palpations: Abdomen is soft.     Tenderness: There is no abdominal tenderness.  Musculoskeletal:        General: No swelling or deformity.  Skin:    General: Skin is warm and dry.  Neurological:     General: No focal deficit present.     Mental Status: Mental status is at baseline.    Labs on Admission: I have personally reviewed following labs and imaging studies  CBC: Recent Labs  Lab 11/16/20 1613  WBC 7.9  HGB 10.7*  HCT 32.7*  MCV 86.7  PLT 681    Basic Metabolic Panel: Recent Labs  Lab 11/16/20 1613  NA 135  K 4.1  CL 108  CO2 19*  GLUCOSE 103*  BUN 39*  CREATININE 2.00*  CALCIUM 8.8*    GFR: Estimated Creatinine Clearance: 47.8 mL/min (A) (by C-G formula based on SCr of 2 mg/dL (H)).  Liver Function Tests: No results for input(s): AST, ALT, ALKPHOS, BILITOT, PROT, ALBUMIN in the last 168 hours.  Urine analysis:    Component Value Date/Time   COLORURINE YELLOW (A) 09/25/2020 1643   APPEARANCEUR HAZY (A) 09/25/2020 1643   APPEARANCEUR Cloudy (A) 11/23/2018 1103   LABSPEC 1.012 09/25/2020 1643   LABSPEC 1.005 03/06/2013 0929   PHURINE 5.0 09/25/2020 1643   GLUCOSEU 150 (A) 09/25/2020 1643   GLUCOSEU Negative 03/06/2013 0929   HGBUR NEGATIVE 09/25/2020 1643  BILIRUBINUR NEGATIVE 09/25/2020 1643   BILIRUBINUR negative 02/20/2019 1404   BILIRUBINUR Negative 11/23/2018 1103   BILIRUBINUR Negative 03/06/2013 0929   KETONESUR NEGATIVE 09/25/2020 1643    PROTEINUR >=300 (A) 09/25/2020 1643   UROBILINOGEN 0.2 02/20/2019 1404   NITRITE NEGATIVE 09/25/2020 1643   LEUKOCYTESUR NEGATIVE 09/25/2020 1643   LEUKOCYTESUR Negative 03/06/2013 0929    Radiological Exams on Admission: DG Chest 2 View  Result Date: 11/16/2020 CLINICAL DATA:  Syncopal episode. EXAM: CHEST - 2 VIEW COMPARISON:  Oct 31, 2020 FINDINGS: The heart size and mediastinal contours are within normal limits. Both lungs are clear. The visualized skeletal structures are unremarkable. IMPRESSION: No active cardiopulmonary disease. Electronically Signed   By: Virgina Norfolk M.D.   On: 11/16/2020 17:07   CT Head Wo Contrast  Result Date: 11/16/2020 CLINICAL DATA:  Head trauma. Near syncopal episode. Headache and neck pain. EXAM: CT HEAD WITHOUT CONTRAST TECHNIQUE: Contiguous axial images were obtained from the base of the skull through the vertex without intravenous contrast. COMPARISON:  Head CT 09/25/2020 FINDINGS: Brain: No intracranial hemorrhage, mass effect, or midline shift. No hydrocephalus. The basilar cisterns are patent. No evidence of territorial infarct or acute ischemia. No extra-axial or intracranial fluid collection. Vascular: No hyperdense vessel. Skull: No fracture or focal lesion. Sinuses/Orbits: Mucous retention cysts in the right maxillary sinus. No acute findings. Other: None. IMPRESSION: No acute intracranial abnormality. No skull fracture. Electronically Signed   By: Keith Rake M.D.   On: 11/16/2020 18:13   CT Cervical Spine Wo Contrast  Result Date: 11/16/2020 CLINICAL DATA:  Neck trauma (Age >= 65y) Near syncope.  Headache and neck pain. EXAM: CT CERVICAL SPINE WITHOUT CONTRAST TECHNIQUE: Multidetector CT imaging of the cervical spine was performed without intravenous contrast. Multiplanar CT image reconstructions were also generated. COMPARISON:  None. FINDINGS: Alignment: No traumatic finding. Exaggerated cervical lordosis. Trace anterolisthesis of C2 on C3 and  C6 on C7. Skull base and vertebrae: No acute fracture. Vertebral body heights are maintained. The dens and skull base are intact. Soft tissues and spinal canal: No prevertebral fluid or swelling. No visible canal hematoma. Disc levels: Multilevel degenerative disc disease. Disc space narrowing and endplate spurring at I6-E7, C4-C5, C6-C7. There is multilevel facet hypertrophy. Upper chest: No acute findings. Other: None. IMPRESSION: Multilevel degenerative disc disease and facet hypertrophy throughout the cervical spine. No acute fracture or subluxation. Electronically Signed   By: Keith Rake M.D.   On: 11/16/2020 18:17   CT Lumbar Spine Wo Contrast  Result Date: 11/16/2020 CLINICAL DATA:  Low back pain, trauma EXAM: CT LUMBAR SPINE WITHOUT CONTRAST TECHNIQUE: Multidetector CT imaging of the lumbar spine was performed without intravenous contrast administration. Multiplanar CT image reconstructions were also generated. COMPARISON:  Lumbar CT 06/17/2019 FINDINGS: Segmentation: 5 lumbar type vertebrae. Alignment: No traumatic subluxation. Trace anterolisthesis of L5 on S1. Vertebrae: No acute fracture or focal pathologic process. Paraspinal and other soft tissues: Right renal cysts. Left nephrectomy. Left adrenal cystic lesion which was fully included on abdominopelvic CT 10/30/2020. No significant change since that time. Aortic and branch atherosclerosis. Disc levels: Increased vacuum phenomena at L2-L3 and L4-L5 from prior CT. Mild diffuse disc bulge and facet hypertrophy, similar to prior exam. IMPRESSION: 1. No acute fracture or subluxation of the lumbar spine. 2. Increased vacuum phenomena at L2-L3 and L4-L5 from prior CT. Mild diffuse disc bulge and facet hypertrophy, similar to prior exam. Aortic Atherosclerosis (ICD10-I70.0). Electronically Signed   By: Keith Rake M.D.   On:  11/16/2020 18:21   DG HIP UNILAT WITH PELVIS 2-3 VIEWS RIGHT  Result Date: 11/16/2020 CLINICAL DATA:  Back pain  radiating to the right hip EXAM: DG HIP (WITH OR WITHOUT PELVIS) 2-3V RIGHT COMPARISON:  Femur radiograph 10/15/2020 FINDINGS: Right hip joint space narrowing. Acetabular and femoral head neck osteophytes unchanged. No fracture. No evidence of a vascular necrosis or focal bone lesion. The pubic rami and remainder of the bony pelvis are intact. Pubic symphysis and sacroiliac joints are congruent. IMPRESSION: Right hip osteoarthritis.  No acute findings. Electronically Signed   By: Keith Rake M.D.   On: 11/16/2020 18:30    EKG: Independently reviewed.  Sinus rhythm at 85 bpm.  Some baseline wander.  Assessment/Plan Principal Problem:   Syncope and collapse Active Problems:   COPD (chronic obstructive pulmonary disease) (HCC)   Essential hypertension   Type 2 diabetes mellitus with hyperglycemia (HCC)   Chronic systolic CHF (congestive heart failure) (HCC)   Chronic kidney disease, stage 3b (HCC)   Acute on chronic diastolic CHF (congestive heart failure) (HCC)   Gastroesophageal reflux disease without esophagitis   History of CVA (cerebrovascular accident)   HLD (hyperlipidemia)  Syncope and collapse > Occurred while out running errands earlier today with no prodrome.  Possibly concerning for arrhythmogenic etiology. > Fell and hit his head and had some head neck and hip pain.  However no acute abnormality noted in ED on chest x-ray, CT head, CT C-spine, CT L-spine, hip x-ray. > Recent echo in April for his heart failure so will not repeat this for now. - Monitor on telemetry - Initial electrolytes stable, will add on magnesium - Orthostatic vital signs - Check magnesium  CHF > Last echo in April with EF 35 to 40% - Continue home Lasix, Coreg, Imdur - Trend renal function electrolytes  CKD 3B > Creatinine stable at 2 in ED. - Avoid nephrotoxic agents - Trend renal function and electrolytes  Diabetes > 26 units daily at home - 15 units daily - SSI  Hypertension -  Continue home Lasix, Coreg, Imdur, hydralazine  COPD - Continue home Spiriva - Replace home Symbicort with formulary Dulera - Continue as needed albuterol  GERD - Continue PPI  History of CVA Hyperlipidemia - Continue home aspirin, statin  DVT prophylaxis: Lovenox  Code Status:   Full  Family Communication:  None on admission Disposition Plan:   Patient is from:  Home  Anticipated DC to:  Home  Anticipated DC date:  1 to 2 days  Anticipated DC barriers: None  Consults called:  None  Admission status:  Observation, telemetry   Severity of Illness: The appropriate patient status for this patient is OBSERVATION. Observation status is judged to be reasonable and necessary in order to provide the required intensity of service to ensure the patient's safety. The patient's presenting symptoms, physical exam findings, and initial radiographic and laboratory data in the context of their medical condition is felt to place them at decreased risk for further clinical deterioration. Furthermore, it is anticipated that the patient will be medically stable for discharge from the hospital within 2 midnights of admission. The following factors support the patient status of observation.   " The patient's presenting symptoms include syncope. " The physical exam findings include obesity. " The initial radiographic and laboratory data are negative imaging work-up, stable creatinine at 2, stable hemoglobin at 10.7, troponin flat at 16 at 19.   Marcelyn Bruins MD [facility]  How to contact the [facility]  Attending or Consulting provider [place] or covering provider during after hours [place], for this patient?   1. Check the care team in [facility] and look for a) attending/consulting [facility] provider listed and b) the [facility] team listed 2. Log into [email or web address] and use [place]'s universal password to access. If you do not have the password, please contact the hospital operator. 3. Locate the [facility] provider  you are looking for under [facility] and page to a number that you can be directly reached. 4. If you still have difficulty reaching the provider, please page the [facility] (Director on Call) for the Hospitalists listed on amion for assistance.  11/16/2020, 9:37 PM

## 2020-11-16 NOTE — ED Notes (Signed)
Pt reports near syncopal episode today at [employer].  Pt has a headache and neck pain.  No chest pain or sob.  No loc.  No n/v/d  nsr on monitor.  Pt alert speech clear.

## 2020-11-16 NOTE — Progress Notes (Signed)
PHARMACIST - PHYSICIAN COMMUNICATION  CONCERNING:  Enoxaparin (Lovenox) for DVT Prophylaxis    RECOMMENDATION: Patient was prescribed enoxaprin 40mg  q24 hours for VTE prophylaxis.   Filed Weights   11/16/20 1611  Weight: 122.5 kg (270 lb)    Body mass index is 37.66 kg/m.  Estimated Creatinine Clearance: 47.8 mL/min (A) (by C-G formula based on SCr of 2 mg/dL (H)).   Based on [place] patient is candidate for enoxaparin 0.5mg /kg TBW SQ every 24 hours based on BMI being >30.  DESCRIPTION: Pharmacy has adjusted enoxaparin dose per [facility] policy.  Patient is now receiving enoxaparin 60 mg every 24 hours    Dorothe Pea, PharmD, BCPS Clinical Pharmacist  11/16/2020 10:07 PM

## 2020-11-16 NOTE — ED Notes (Signed)
meds given, labs sent. Pt has right hip pain and a headache.  Pt alert  Speech clear  nsr on monitor.

## 2020-11-16 NOTE — ED Triage Notes (Signed)
Pt brought in via [facility] from [employer] with a near syncopal episode outside.  Pt has a headache and neck pain.  Pt has abrasion to left knees  Pt alert  Speech clear

## 2020-11-17 DIAGNOSIS — R55 Syncope and collapse: Secondary | ICD-10-CM | POA: Diagnosis not present

## 2020-11-17 DIAGNOSIS — I5022 Chronic systolic (congestive) heart failure: Secondary | ICD-10-CM | POA: Diagnosis not present

## 2020-11-17 DIAGNOSIS — N1832 Chronic kidney disease, stage 3b: Secondary | ICD-10-CM | POA: Diagnosis not present

## 2020-11-17 DIAGNOSIS — I10 Essential (primary) hypertension: Secondary | ICD-10-CM | POA: Diagnosis not present

## 2020-11-17 LAB — BASIC METABOLIC PANEL
Anion gap: 5 (ref 5–15)
BUN: 43 mg/dL — ABNORMAL HIGH (ref 8–23)
CO2: 21 mmol/L — ABNORMAL LOW (ref 22–32)
Calcium: 8.7 mg/dL — ABNORMAL LOW (ref 8.9–10.3)
Chloride: 108 mmol/L (ref 98–111)
Creatinine, Ser: 2.19 mg/dL — ABNORMAL HIGH (ref 0.61–1.24)
GFR, Estimated: 32 mL/min — ABNORMAL LOW (ref >60)
Glucose, Bld: 147 mg/dL — ABNORMAL HIGH (ref 70–99)
Potassium: 4 mmol/L (ref 3.5–5.1)
Sodium: 134 mmol/L — ABNORMAL LOW (ref 135–145)

## 2020-11-17 LAB — GLUCOSE, CAPILLARY
Glucose-Capillary: 114 mg/dL — ABNORMAL HIGH (ref 70–99)
Glucose-Capillary: 292 mg/dL — ABNORMAL HIGH (ref 70–99)
Glucose-Capillary: 138 mg/dL — ABNORMAL HIGH (ref 70–99)
Glucose-Capillary: 128 mg/dL — ABNORMAL HIGH (ref 70–99)

## 2020-11-17 LAB — CBC
HCT: 30 % — ABNORMAL LOW (ref 39.0–52.0)
Hemoglobin: 9.6 g/dL — ABNORMAL LOW (ref 13.0–17.0)
MCH: 28.5 pg (ref 26.0–34.0)
MCHC: 32 g/dL (ref 30.0–36.0)
MCV: 89 fL (ref 80.0–100.0)
Platelets: 156 10*3/uL (ref 150–400)
RBC: 3.37 MIL/uL — ABNORMAL LOW (ref 4.22–5.81)
RDW: 13.5 % (ref 11.5–15.5)
WBC: 6.5 10*3/uL (ref 4.0–10.5)
nRBC: 0 % (ref 0.0–0.2)

## 2020-11-17 LAB — CBG MONITORING, ED: Glucose-Capillary: 138 mg/dL — ABNORMAL HIGH (ref 70–99)

## 2020-11-17 MED ORDER — LORATADINE 10 MG PO TABS
10.0000 mg | ORAL_TABLET | Freq: Every day | ORAL | Status: DC
  Administered 2020-11-17 – 2020-11-18 (×2): 10 mg via ORAL
  Filled 2020-11-17 (×2): qty 1

## 2020-11-17 NOTE — TOC Initial Note (Signed)
Transition of Care [facility]) - Initial/Assessment Note    Patient Details  Name: Larry Callahan MRN: [id number] Date of Birth: 18-Mar-1956  Transition of Care [facility]) CM/SW Contact:    Shelbie Hutching, RN Phone Number: 11/17/2020, 3:06 PM  Clinical Narrative:                 Patient under observation for syncope.  RNCM met with patient at the bedside.  Patient is from home alone.  He is current with PCP and cardiology.  He does not drive but his neighbor does take him places when needed.   Patient is followed by [facility] and they follow up routinely with patient outpatient.   Patient will need transportation home tomorrow at discharge.  Expected Discharge Plan: Home/Self Care Barriers to Discharge: Continued Medical Work up   Patient Goals and CMS Choice Patient states their goals for this hospitalization and ongoing recovery are:: To go back home tomorrow      Expected Discharge Plan and Services Expected Discharge Plan: Home/Self Care   Discharge Planning Services: CM Consult   Living arrangements for the past 2 months: Mobile Home                 DME Arranged: N/A DME Agency: NA       HH Arranged: NA          Prior Living Arrangements/Services Living arrangements for the past 2 months: Mobile Home Lives with:: Self,Pets Patient language and need for interpreter reviewed:: Yes Do you feel safe going back to the place where you live?: Yes      Need for Family Participation in Patient Care: Yes (Comment) (syncope) Care giver support system in place?: Yes (comment) (sister and neighbor) Current home services: DME (walker and cane) Criminal Activity/Legal Involvement Pertinent to Current Situation/Hospitalization: No - Comment as needed  Activities of Daily Living Home Assistive Devices/Equipment: Cane (specify quad or straight),Walker (specify type),CPAP ADL Screening (condition at time of admission) Patient's cognitive ability adequate to safely complete daily  activities?: Yes Is the patient deaf or have difficulty hearing?: No Does the patient have difficulty seeing, even when wearing glasses/contacts?: No Does the patient have difficulty concentrating, remembering, or making decisions?: No Patient able to express need for assistance with ADLs?: Yes Does the patient have difficulty dressing or bathing?: No Independently performs ADLs?: Yes (appropriate for developmental age) Does the patient have difficulty walking or climbing stairs?: Yes Weakness of Legs: Both Weakness of Arms/Hands: None  Permission Sought/Granted   Permission granted to share information with : No              Emotional Assessment Appearance:: Appears stated age Attitude/Demeanor/Rapport: Engaged Affect (typically observed): Accepting Orientation: : Oriented to Self,Oriented to Place,Oriented to  Time,Oriented to Situation Alcohol / Substance Use: Not Applicable Psych Involvement: No (comment)  Admission diagnosis:  Syncope and collapse [R55] Pain [R52] Syncope, unspecified syncope type [R55] Patient Active Problem List   Diagnosis Date Noted  . Syncope and collapse 11/16/2020  . Demand ischemia ([place])   . Influenza A 10/30/2020  . Chest pain 10/06/2020  . HLD (hyperlipidemia) 10/06/2020  . History of CVA (cerebrovascular accident) 09/25/2020  . Gastroesophageal reflux disease without esophagitis 04/19/2020  . Pneumonia due to COVID-19 virus 01/30/2020  . Elevated troponin I level 01/29/2020  . Suspected COVID-19 virus infection 01/29/2020  . Postural dizziness with presyncope 01/29/2020  . Hematemesis 01/09/2020  . Hospital discharge follow-up 11/26/2019  . Cellulitis of left hand 09/26/2019  .  Generalized weakness 08/08/2019  . Obesity (BMI 30-39.9) 08/08/2019  . Acute right hip pain 06/17/2019  . Inability to ambulate due to hip 06/17/2019  . CKD stage 3 due to type 2 diabetes mellitus ([place])   . Hypervolemia   . Acute on chronic diastolic CHF  (congestive heart failure) ([place])   . Type 2 diabetes mellitus with stage 3b chronic kidney disease, with long-term current use of insulin ([place])   . Hypomagnesemia   . Full code status 05/20/2019  . Pleuritic chest pain   . Multifocal pneumonia 05/19/2019  . Acute kidney injury superimposed on CKD 3b ([place]) 05/09/2019  . Hemoptysis 04/09/2019  . Shortness of breath 03/26/2019  . Uncontrolled type 2 diabetes mellitus with insulin therapy ([place]) 03/12/2019  . Acute on chronic heart failure with preserved ejection fraction (HFpEF) ([place])   . Chronic kidney disease, stage 3b ([place])   . Chronic systolic CHF (congestive heart failure) ([place]) 03/01/2019  . Diastolic dysfunction 83/25/4982  . Iron deficiency anemia 12/31/2018  . Atopic dermatitis 11/24/2018  . Screening for colon cancer 11/06/2017  . Uncontrolled type 2 diabetes mellitus with hyperglycemia ([place]) 11/06/2017  . Hidradenitis 06/09/2017  . Atherosclerotic heart disease of native coronary artery without angina pectoris 06/09/2017  . 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 Obstructive sleep apnea, adult 06/09/2017  . Morbid (severe) obesity due to excess calories ([place]) 06/09/2017  . Cigarette nicotine dependence with nicotine-induced disorder 06/09/2017  . Diabetic polyneuropathy associated with type 2 diabetes mellitus ([place]) 06/09/2017  . Allergic rhinitis due to pollen 06/09/2017  . Mixed hyperlipidemia 06/09/2017  . COPD (chronic obstructive pulmonary disease) ([place]) 06/09/2017  . Dyspnea on exertion 06/09/2017  . Wheezing 06/09/2017  . Dysuria 06/09/2017  . Snoring 06/09/2017  . Essential hypertension 06/09/2017  . Personal history of other malignant neoplasm of kidney 06/09/2017  . Pain in right hip 06/09/2017  . Impacted cerumen, bilateral 06/09/2017  . Secondary hyperparathyroidism, not elsewhere classified ([place]) 06/09/2017  . Type 2 diabetes mellitus with hyperglycemia ([place]) 06/09/2017  .  Tinea corporis 06/09/2017   PCP:  Lavera Guise, MD Pharmacy:   [place], [place] - [address] [place] [address] Phone: [phone] Fax: [phone]     Social Determinants of Health (SDOH) Interventions    Readmission Risk Interventions Readmission Risk Prevention Plan 02/01/2020 01/31/2020 05/10/2019  Transportation Screening Complete Complete Complete  Medication Review Press photographer) Complete Complete Complete  PCP or Specialist appointment within 3-5 days of discharge Complete Complete -  [place] or Home Care Consult Complete Complete -  SW Recovery Care/Counseling Consult Complete Complete -  SW Consult Not Complete Comments - - -  Palliative Care Screening Not Applicable Not Applicable Not [place] Not Applicable Not Applicable Not Applicable  Some recent data might be hidden

## 2020-11-17 NOTE — ED Notes (Signed)
Pt to restroom

## 2020-11-17 NOTE — Care Management Obs Status (Signed)
[place] NOTIFICATION   Patient Details  Name: Larry Callahan MRN: [id number] Date of Birth: 09-18-1955   Medicare Observation Status Notification Given:  Yes    Shelbie Hutching, RN 11/17/2020, 12:26 PM

## 2020-11-17 NOTE — Progress Notes (Addendum)
PROGRESS NOTE    Larry Callahan Prom  [id number] DOB: Sep 25, 1955 DOA: 11/16/2020 PCP: Lavera Guise, MD   Assessment & Plan:   Principal Problem:   Syncope and collapse Active Problems:   COPD (chronic obstructive pulmonary disease) ([place])   Essential hypertension   Type 2 diabetes mellitus with hyperglycemia (HCC)   Chronic systolic CHF (congestive heart failure) (HCC)   Chronic kidney disease, stage 3b (HCC)   Acute on chronic diastolic CHF (congestive heart failure) (HCC)   Gastroesophageal reflux disease without esophagitis   History of CVA (cerebrovascular accident)   HLD (hyperlipidemia)   Syncope: etiology unclear, possibly arrhythmogenic. CT head showed no acute intracranial abnormality. CT C-spine showed multilevel degenerative disc disease & facet hypertrophy throughout cervical spine. CT L spine showed mild diffuse disc bulge & facet hypertrophy, similar to prior exam. Continue on tele   Chronic systolic CHF: last echo in 09/2020 showed EF 48-18%, normal diastolic function. Continue on home dose of lasix, coreg, imdur  CKDIIIb: Cr is trending up from day prior. Avoid nephrotoxic meds  DM2: likely poorly controlled. Continue on lantus, SSI w/ accuchecks   HTN: uncontrolled. Continue on home dose of coreg, imdur, hydralazine   COPD: w/o exacerbation. Continue on bronchodilators  GERD: continue on PPI   HLD: continue on statin   Hx of CVA: continue on aspirin, statin      DVT prophylaxis: lovenox Code Status: full  Family Communication:  Disposition Plan: depends on PT/OT recs  Level of care: Med-Surg   Status is: Observation  The patient remains OBS appropriate and will d/c before 2 midnights.  Dispo: The patient is from: Home              Anticipated d/c is to: Home              Patient currently is not medically stable to d/c.   Difficult to place patient : unclear       Consultants:      Procedures:     Antimicrobials:   Subjective: Pt c/o hip pain   Objective: Vitals:   11/17/20 0145 11/17/20 0200 11/17/20 0400 11/17/20 0600  BP:  (!) 154/50 (!) 162/79 (!) 171/82  Pulse: 62 72 66 64  Resp: (!) 21 (!) 27 (!) 27 (!) 26  Temp:      TempSrc:      SpO2: 96% 95% 96% 97%  Weight:      Height:       No intake or output data in the 24 hours ending 11/17/20 0811 Filed Weights   11/16/20 1611  Weight: 122.5 kg    Examination:  General exam: Appears calm and comfortable  Respiratory system: Clear to auscultation. Respiratory effort normal. Cardiovascular system: S1 & S2 +. No rubs, gallops or clicks.  Gastrointestinal system: Abdomen is obese, soft and nontender.  Normal bowel sounds heard. Central nervous system: Alert and oriented. Moves all extremities  Psychiatry: Judgement and insight appear normal. Mood & affect appropriate.     Data Reviewed: I have personally reviewed following labs and imaging studies  CBC: Recent Labs  Lab 11/16/20 1613 11/17/20 0422  WBC 7.9 6.5  HGB 10.7* 9.6*  HCT 32.7* 30.0*  MCV 86.7 89.0  PLT 183 563   Basic Metabolic Panel: Recent Labs  Lab 11/16/20 1613 11/17/20 0422  NA 135 134*  K 4.1 4.0  CL 108 108  CO2 19* 21*  GLUCOSE 103* 147*  BUN 39* 43*  CREATININE 2.00* 2.19*  CALCIUM 8.8* 8.7*  MG 1.7  --    GFR: Estimated Creatinine Clearance: 43.6 mL/min (A) (by C-G formula based on SCr of 2.19 mg/dL (H)). Liver Function Tests: No results for input(s): AST, ALT, ALKPHOS, BILITOT, PROT, ALBUMIN in the last 168 hours. No results for input(s): LIPASE, AMYLASE in the last 168 hours. No results for input(s): AMMONIA in the last 168 hours. Coagulation Profile: No results for input(s): INR, PROTIME in the last 168 hours. Cardiac Enzymes: No results for input(s): CKTOTAL, CKMB, CKMBINDEX, TROPONINI in the last 168 hours. BNP (last 3 results) No results for input(s): PROBNP in the last 8760 hours. HbA1C: No results for  input(s): HGBA1C in the last 72 hours. CBG: Recent Labs  Lab 11/17/20 0431  GLUCAP 138*   Lipid Profile: No results for input(s): CHOL, HDL, LDLCALC, TRIG, CHOLHDL, LDLDIRECT in the last 72 hours. Thyroid Function Tests: No results for input(s): TSH, T4TOTAL, FREET4, T3FREE, THYROIDAB in the last 72 hours. Anemia Panel: No results for input(s): VITAMINB12, FOLATE, FERRITIN, TIBC, IRON, RETICCTPCT in the last 72 hours. Sepsis Labs: No results for input(s): PROCALCITON, LATICACIDVEN in the last 168 hours.  Recent Results (from the past 240 hour(s))  Resp Panel by RT-PCR (Flu A&B, Covid) Nasopharyngeal Swab     Status: None   Collection Time: 11/16/20  4:13 PM   Specimen: Nasopharyngeal Swab; Nasopharyngeal(NP) swabs in vial transport medium  Result Value Ref Range Status   SARS Coronavirus 2 by RT PCR NEGATIVE NEGATIVE Final    Comment: (NOTE) SARS-CoV-2 target nucleic acids are NOT DETECTED.  The SARS-CoV-2 RNA is generally detectable in upper respiratory specimens during the acute phase of infection. The lowest concentration of SARS-CoV-2 viral copies this assay can detect is 138 copies/mL. A negative result does not preclude SARS-Cov-2 infection and should not be used as the sole basis for treatment or other patient management decisions. A negative result may occur with  improper specimen collection/handling, submission of specimen other than nasopharyngeal swab, presence of viral mutation(s) within the areas targeted by this assay, and inadequate number of viral copies(<138 copies/mL). A negative result must be combined with clinical observations, patient history, and epidemiological information. The expected result is Negative.  Fact Sheet for Patients:  EntrepreneurPulse.com.au  Fact Sheet for Healthcare Providers:  IncredibleEmployment.be  This test is no t yet approved or cleared by the [place] FDA and  has been authorized  for detection and/or diagnosis of SARS-CoV-2 by FDA under an Emergency Use Authorization (EUA). This EUA will remain  in effect (meaning this test can be used) for the duration of the COVID-19 declaration under Section 564(b)(1) of the Act, 21 U.S.C.section 360bbb-3(b)(1), unless the authorization is terminated  or revoked sooner.       Influenza A by PCR NEGATIVE NEGATIVE Final   Influenza B by PCR NEGATIVE NEGATIVE Final    Comment: (NOTE) The Xpert Xpress SARS-CoV-2/FLU/RSV plus assay is intended as an aid in the diagnosis of influenza from Nasopharyngeal swab specimens and should not be used as a sole basis for treatment. Nasal washings and aspirates are unacceptable for Xpert Xpress SARS-CoV-2/FLU/RSV testing.  Fact Sheet for Patients: EntrepreneurPulse.com.au  Fact Sheet for Healthcare Providers: IncredibleEmployment.be  This test is not yet approved or cleared by the [place] FDA and has been authorized for detection and/or diagnosis of SARS-CoV-2 by FDA under an Emergency Use Authorization (EUA). This EUA will remain in effect (meaning this test can be used) for the duration of the COVID-19 declaration under  Section 564(b)(1) of the Act, 21 U.S.C. section 360bbb-3(b)(1), unless the authorization is terminated or revoked.  Performed at [facility], [address], [place], [place] [address]          Radiology Studies: DG Chest 2 View  Result Date: 11/16/2020 CLINICAL DATA:  Syncopal episode. EXAM: CHEST - 2 VIEW COMPARISON:  Oct 31, 2020 FINDINGS: The heart size and mediastinal contours are within normal limits. Both lungs are clear. The visualized skeletal structures are unremarkable. IMPRESSION: No active cardiopulmonary disease. Electronically Signed   By: Virgina Norfolk M.D.   On: 11/16/2020 17:07   CT Head Wo Contrast  Result Date: 11/16/2020 CLINICAL DATA:  Head trauma. Near syncopal episode. Headache and  neck pain. EXAM: CT HEAD WITHOUT CONTRAST TECHNIQUE: Contiguous axial images were obtained from the base of the skull through the vertex without intravenous contrast. COMPARISON:  Head CT 09/25/2020 FINDINGS: Brain: No intracranial hemorrhage, mass effect, or midline shift. No hydrocephalus. The basilar cisterns are patent. No evidence of territorial infarct or acute ischemia. No extra-axial or intracranial fluid collection. Vascular: No hyperdense vessel. Skull: No fracture or focal lesion. Sinuses/Orbits: Mucous retention cysts in the right maxillary sinus. No acute findings. Other: None. IMPRESSION: No acute intracranial abnormality. No skull fracture. Electronically Signed   By: Keith Rake M.D.   On: 11/16/2020 18:13   CT Cervical Spine Wo Contrast  Result Date: 11/16/2020 CLINICAL DATA:  Neck trauma (Age >= 65y) Near syncope.  Headache and neck pain. EXAM: CT CERVICAL SPINE WITHOUT CONTRAST TECHNIQUE: Multidetector CT imaging of the cervical spine was performed without intravenous contrast. Multiplanar CT image reconstructions were also generated. COMPARISON:  None. FINDINGS: Alignment: No traumatic finding. Exaggerated cervical lordosis. Trace anterolisthesis of C2 on C3 and C6 on C7. Skull base and vertebrae: No acute fracture. Vertebral body heights are maintained. The dens and skull base are intact. Soft tissues and spinal canal: No prevertebral fluid or swelling. No visible canal hematoma. Disc levels: Multilevel degenerative disc disease. Disc space narrowing and endplate spurring at C6-C3, C4-C5, C6-C7. There is multilevel facet hypertrophy. Upper chest: No acute findings. Other: None. IMPRESSION: Multilevel degenerative disc disease and facet hypertrophy throughout the cervical spine. No acute fracture or subluxation. Electronically Signed   By: Keith Rake M.D.   On: 11/16/2020 18:17   CT Lumbar Spine Wo Contrast  Result Date: 11/16/2020 CLINICAL DATA:  Low back pain, trauma EXAM: CT  LUMBAR SPINE WITHOUT CONTRAST TECHNIQUE: Multidetector CT imaging of the lumbar spine was performed without intravenous contrast administration. Multiplanar CT image reconstructions were also generated. COMPARISON:  Lumbar CT 06/17/2019 FINDINGS: Segmentation: 5 lumbar type vertebrae. Alignment: No traumatic subluxation. Trace anterolisthesis of L5 on S1. Vertebrae: No acute fracture or focal pathologic process. Paraspinal and other soft tissues: Right renal cysts. Left nephrectomy. Left adrenal cystic lesion which was fully included on abdominopelvic CT 10/30/2020. No significant change since that time. Aortic and branch atherosclerosis. Disc levels: Increased vacuum phenomena at L2-L3 and L4-L5 from prior CT. Mild diffuse disc bulge and facet hypertrophy, similar to prior exam. IMPRESSION: 1. No acute fracture or subluxation of the lumbar spine. 2. Increased vacuum phenomena at L2-L3 and L4-L5 from prior CT. Mild diffuse disc bulge and facet hypertrophy, similar to prior exam. Aortic Atherosclerosis (ICD10-I70.0). Electronically Signed   By: Keith Rake M.D.   On: 11/16/2020 18:21   DG HIP UNILAT WITH PELVIS 2-3 VIEWS RIGHT  Result Date: 11/16/2020 CLINICAL DATA:  Back pain radiating to the right hip  EXAM: DG HIP (WITH OR WITHOUT PELVIS) 2-3V RIGHT COMPARISON:  Femur radiograph 10/15/2020 FINDINGS: Right hip joint space narrowing. Acetabular and femoral head neck osteophytes unchanged. No fracture. No evidence of a vascular necrosis or focal bone lesion. The pubic rami and remainder of the bony pelvis are intact. Pubic symphysis and sacroiliac joints are congruent. IMPRESSION: Right hip osteoarthritis.  No acute findings. Electronically Signed   By: Keith Rake M.D.   On: 11/16/2020 18:30        Scheduled Meds: . aspirin EC  81 mg Oral Daily  . carvedilol  6.25 mg Oral BID WC  . enoxaparin (LOVENOX) injection  60 mg Subcutaneous Q24H  . furosemide  40 mg Oral Daily  . gabapentin  100 mg  Oral TID  . hydrALAZINE  100 mg Oral TID  . insulin aspart  0-9 Units Subcutaneous TID WC  . insulin glargine  15 Units Subcutaneous Daily  . isosorbide mononitrate  30 mg Oral BID  . mometasone-formoterol  2 puff Inhalation BID  . pantoprazole  40 mg Oral Daily  . sodium chloride flush  3 mL Intravenous Q12H  . tiotropium  18 mcg Inhalation Daily   Continuous Infusions:   LOS: 0 days    Time spent: 32 mins     Wyvonnia Dusky, MD [facility] Pager [phone]  If 7PM-7AM, please contact night-coverage 11/17/2020, 8:11 AM

## 2020-11-17 NOTE — ED Notes (Signed)
Pt resting.

## 2020-11-17 NOTE — ED Notes (Signed)
Report received from [facility] RN. Patient care assumed. Patient/RN introduction complete. Will continue to monitor. Pt still co right hip pain rates it 7/10. Pt awaiting hospital bed availability.

## 2020-11-17 NOTE — ED Notes (Signed)
Pt sleeping. 

## 2020-11-17 NOTE — Progress Notes (Signed)
PT Cancellation Note  Patient Details Name: Larry Callahan MRN: [id number] DOB: 1955-09-13   Cancelled Treatment:    Reason Eval/Treat Not Completed: Medical issues which prohibited therapy Pt has been having elevated BP that has not been coming down.  Spoke with nurse who reports she is waiting on some meds from pharmacy.  Will wait until he has these and he shows better vitals/BP.  Held this AM and will continue to follow as appropriate.  Kreg Shropshire, DPT 11/17/2020, 11:08 AM

## 2020-11-17 NOTE — Evaluation (Signed)
Physical Therapy Evaluation Patient Details Name: Larry Callahan MRN: [id number] DOB: [birth year] Today's Date: 11/17/2020   History of Present Illness  65 y.o. male with medical history significant of CKD 3B, CHF, COPD, CVA, diabetes, hypertension, GERD, hyperlipidemia, anemia, obesity, history of postural dizziness and presyncope, who presents after episode of syncope and hit his head.  He initially felt fine and was able to get groceries in the home but started having more pain and he came to the hospital.  Clinical Impression  Pt eager to try and do a longer walker, but somewhat pensive as he had some unsteadiness last time he was up.  We used walker (typically uses only [facility]) and he was more stable and confidence and ultimately circumambulated the nurses' station with relatively consistent and confident cadence.  He did not have any orthostatic symptoms sit to stand and though he is not at his baseline he does feel good about being able to go home tomorrow.  Pt has a sister that checks in as well as a helpful neighbor but generally speaking does not have a lot of assist.  Suggesting he use his RW and have HHPT when he initially goes home to insure he is able to transition safely.      Follow Up Recommendations Home health PT;Supervision - Intermittent    Equipment Recommendations  None recommended by PT    Recommendations for Other Services       Precautions / Restrictions Precautions Precautions: Fall Restrictions Weight Bearing Restrictions: No      Mobility  Bed Mobility               General bed mobility comments: in recliner on arrival, returned to recliner    Transfers Overall transfer level: Modified independent Equipment used: Rolling walker (2 wheeled);None             General transfer comment: Pt able to rise w/o AD, though did begin to reach for UE support after ~15 seconds.  Rose with walker on next effort with improved  confidence/stability.  Ambulation/Gait Ambulation/Gait assistance: Supervision Gait Distance (Feet): 200 Feet Assistive device: Rolling walker (2 wheeled)       General Gait Details: Pt able to assume relatively consistent cadence with walker, did not rely heavily on UEs.  His O2 and HR remained appropriate and consistent and he did not have excessive fatigue or any LOBs.  Pt able to maintain conversation and did not have any c/o dizziness, etc during the effort.  Stairs            Wheelchair Mobility    Modified Rankin (Stroke Patients Only)       Balance Overall balance assessment: Needs assistance Sitting-balance support: No upper extremity supported Sitting balance-Leahy Scale: Good     Standing balance support: No upper extremity supported Standing balance-Leahy Scale: Fair Standing balance comment: Pt with no overt unsteadiness, but did display guarded posture when not holding onto at least something with UE                             Pertinent Vitals/Pain Pain Assessment: 0-10 Pain Score: 1  Pain Location: head    Home Living Family/patient expects to be discharged to:: Private residence Living Arrangements: Alone Available Help at Discharge: Family;Available PRN/intermittently Type of Home: Mobile home Home Access: Stairs to enter Entrance Stairs-Rails: Left Entrance Stairs-Number of Steps: 5 Home Layout: One level Home Equipment: Cane - single point;Bedside commode;Walker -  2 wheels      Prior Function Level of Independence: Independent with assistive device(s)         Comments: Uses SPC for functional mobility. Home care nurse comes 1x/month to assists with blood sugar monitoring.     Hand Dominance        Extremity/Trunk Assessment   Upper Extremity Assessment Upper Extremity Assessment: Overall WFL for tasks assessed    Lower Extremity Assessment Lower Extremity Assessment: Overall WFL for tasks assessed        Communication   Communication: No difficulties (difficult to keep on task)  Cognition Arousal/Alertness: Awake/alert Behavior During Therapy: WFL for tasks assessed/performed;Impulsive Overall Cognitive Status: Within Functional Limits for tasks assessed                                        General Comments General comments (skin integrity, edema, etc.): BP in sitting 128/68, standing 130/69 and asymptomatic    Exercises     Assessment/Plan    PT Assessment Patient needs continued PT services  PT Problem List Decreased strength;Decreased range of motion;Decreased activity tolerance;Decreased balance;Decreased mobility;Decreased knowledge of use of DME;Decreased safety awareness;Pain;Cardiopulmonary status limiting activity       PT Treatment Interventions DME instruction;Gait training;Stair training;Functional mobility training;Therapeutic activities;Balance training;Therapeutic exercise;Neuromuscular re-education;Patient/family education    PT Goals (Current goals can be found in the Care Plan section)  Acute Rehab PT Goals Patient Stated Goal: go home PT Goal Formulation: With patient Time For Goal Achievement: 12/01/20 Potential to Achieve Goals: Good    Frequency Min 2X/week   Barriers to discharge        Co-evaluation               AM-PAC PT "6 Clicks" Mobility  Outcome Measure Help needed turning from your back to your side while in a flat bed without using bedrails?: None Help needed moving from lying on your back to sitting on the side of a flat bed without using bedrails?: None Help needed moving to and from a bed to a chair (including a wheelchair)?: A Little Help needed standing up from a chair using your arms (e.g., wheelchair or bedside chair)?: A Little Help needed to walk in hospital room?: A Little Help needed climbing 3-5 steps with a railing? : A Little 6 Click Score: 20    End of Session Equipment Utilized During Treatment:  Gait belt Activity Tolerance: Patient tolerated treatment well;Patient limited by fatigue   Nurse Communication: Mobility status;Patient requests pain meds PT Visit Diagnosis: Muscle weakness (generalized) (M62.81);Unsteadiness on feet (R26.81);Difficulty in walking, not elsewhere classified (R26.2);History of falling (Z91.81)    Time: 1510-1530 PT Time Calculation (min) (ACUTE ONLY): 20 min   Charges:   PT Evaluation $PT Eval Low Complexity: 1 Low PT Treatments $Gait Training: 8-22 mins        Kreg Shropshire, DPT 11/17/2020, 4:00 PM

## 2020-11-17 NOTE — Evaluation (Signed)
Occupational Therapy Evaluation Patient Details Name: Larry Callahan MRN: [id number] DOB: 1955-11-10 Today's Date: 11/17/2020    History of Present Illness 65 y.o. male with medical history significant of CKD 3B, CHF, COPD, CVA, diabetes, hypertension, GERD, hyperlipidemia, anemia, obesity, history of postural dizziness and presyncope, who presents after episode of syncope and hit his head.  He initially felt fine and was able to get groceries in the home but started having more pain and he came to the hospital.   Clinical Impression   Pt seen for OT evaluation on this date. Upon arrival to room, pt awake and seated upright in recliner. Pt reporting 1/10 head pain, however agreeable to OT evaluation following MIN encouragement. Pt currently requires SUPERVISION for LB dressing, SUPERVISION for functional mobility of short household distances (~57ft) without AD, and SUPERVISION for standing grooming tasks d/t decreased balance and pt reporting feeling "faint"; vitals stable throughout. Pt would benefit from additional skilled OT services to maximize return to PLOF and minimize risk of future falls, injury, caregiver burden, and readmission. Upon discharge recommend no OT follow-up    Follow Up Recommendations  No OT follow up    Equipment Recommendations  None recommended by OT       Precautions / Restrictions Precautions Precautions: Fall Restrictions Weight Bearing Restrictions: No      Mobility Bed Mobility               General bed mobility comments: not assessed, in recliner at beginning/end of session    Transfers Overall transfer level: Modified independent Equipment used: None             General transfer comment: Pt able to rise w/o AD and no LOB observed    Balance Overall balance assessment: Needs assistance Sitting-balance support: No upper extremity supported;Feet supported Sitting balance-Leahy Scale: Good Sitting balance - Comments: able to  bend over to don socks with no LOB observed   Standing balance support: No upper extremity supported;During functional activity Standing balance-Leahy Scale: Fair Standing balance comment: Supervision for standing hand hygiene                           ADL either performed or assessed with clinical judgement   ADL Overall ADL's : Needs assistance/impaired                                       General ADL Comments: Supervision to don socks and perform standing hand hygiene                  Pertinent Vitals/Pain Pain Assessment: 0-10 Pain Score: 1  Pain Location: head Pain Descriptors / Indicators: Aching Pain Intervention(s): Limited activity within patient's tolerance;Patient requesting pain meds-RN notified     Hand Dominance     Extremity/Trunk Assessment Upper Extremity Assessment Upper Extremity Assessment: Overall WFL for tasks assessed   Lower Extremity Assessment Lower Extremity Assessment: Overall WFL for tasks assessed   Cervical / Trunk Assessment Cervical / Trunk Assessment: Normal   Communication Communication Communication: No difficulties (difficult to keep on task)   Cognition Arousal/Alertness: Awake/alert Behavior During Therapy: WFL for tasks assessed/performed;Impulsive Overall Cognitive Status: Within Functional Limits for tasks assessed  Home Living Family/patient expects to be discharged to:: Private residence Living Arrangements: Alone Available Help at Discharge: Family;Available PRN/intermittently Type of Home: Mobile home Home Access: Stairs to enter Entrance Stairs-Number of Steps: 5 Entrance Stairs-Rails: Left Home Layout: One level     Bathroom Shower/Tub: Teacher, early years/pre: Standard     Home Equipment: Cane - single point;Bedside commode;Walker - 2 wheels          Prior Functioning/Environment Level of  Independence: Independent with assistive device(s)        Comments: Uses SPC for functional mobility. Home care nurse comes 1x/month to assists with blood sugar monitoring.        OT Problem List: Impaired balance (sitting and/or standing);Decreased safety awareness      OT Treatment/Interventions: Self-care/ADL training;Therapeutic exercise;DME and/or AE instruction;Energy conservation;Therapeutic activities;Patient/family education;Balance training    OT Goals(Current goals can be found in the care plan section) Acute Rehab OT Goals Patient Stated Goal: to go home OT Goal Formulation: With patient Time For Goal Achievement: 12/01/20 Potential to Achieve Goals: Good ADL Goals Pt Will Perform Grooming: with modified independence;standing Pt Will Perform Lower Body Dressing: with modified independence;sit to/from stand Pt Will Transfer to Toilet: with modified independence;ambulating;regular height toilet  OT Frequency: Min 1X/week    AM-PAC OT "6 Clicks" Daily Activity     Outcome Measure Help from another person eating meals?: None Help from another person taking care of personal grooming?: A Little Help from another person toileting, which includes using toliet, bedpan, or urinal?: A Little Help from another person bathing (including washing, rinsing, drying)?: A Little Help from another person to put on and taking off regular upper body clothing?: None Help from another person to put on and taking off regular lower body clothing?: A Little 6 Click Score: 20   End of Session Equipment Utilized During Treatment: Gait belt Nurse Communication: Mobility status  Activity Tolerance: Patient tolerated treatment well Patient left: in chair;with call bell/phone within reach  OT Visit Diagnosis: Unsteadiness on feet (R26.81)                Time: 9753-0051 OT Time Calculation (min): 23 min Charges:  OT General Charges $OT Visit: 1 Visit OT Evaluation $OT Eval Moderate  Complexity: 1 Mod OT Treatments $Self Care/Home Management : 8-22 mins  Fredirick Maudlin, OTR/L [place]

## 2020-11-17 NOTE — ED Notes (Signed)
No change in condition, will continue to monitor.  

## 2020-11-18 DIAGNOSIS — R55 Syncope and collapse: Secondary | ICD-10-CM | POA: Diagnosis not present

## 2020-11-18 DIAGNOSIS — N1832 Chronic kidney disease, stage 3b: Secondary | ICD-10-CM | POA: Diagnosis not present

## 2020-11-18 DIAGNOSIS — I10 Essential (primary) hypertension: Secondary | ICD-10-CM | POA: Diagnosis not present

## 2020-11-18 DIAGNOSIS — I5022 Chronic systolic (congestive) heart failure: Secondary | ICD-10-CM | POA: Diagnosis not present

## 2020-11-18 LAB — BASIC METABOLIC PANEL
Anion gap: 9 (ref 5–15)
BUN: 46 mg/dL — ABNORMAL HIGH (ref 8–23)
CO2: 19 mmol/L — ABNORMAL LOW (ref 22–32)
Calcium: 8.4 mg/dL — ABNORMAL LOW (ref 8.9–10.3)
Chloride: 107 mmol/L (ref 98–111)
Creatinine, Ser: 2.38 mg/dL — ABNORMAL HIGH (ref 0.61–1.24)
GFR, Estimated: 29 mL/min — ABNORMAL LOW (ref >60)
Glucose, Bld: 120 mg/dL — ABNORMAL HIGH (ref 70–99)
Potassium: 4 mmol/L (ref 3.5–5.1)
Sodium: 135 mmol/L (ref 135–145)

## 2020-11-18 LAB — GLUCOSE, CAPILLARY
Glucose-Capillary: 129 mg/dL — ABNORMAL HIGH (ref 70–99)
Glucose-Capillary: 146 mg/dL — ABNORMAL HIGH (ref 70–99)

## 2020-11-18 LAB — CBC
HCT: 28.4 % — ABNORMAL LOW (ref 39.0–52.0)
Hemoglobin: 9.3 g/dL — ABNORMAL LOW (ref 13.0–17.0)
MCH: 28.4 pg (ref 26.0–34.0)
MCHC: 32.7 g/dL (ref 30.0–36.0)
MCV: 86.6 fL (ref 80.0–100.0)
Platelets: 165 10*3/uL (ref 150–400)
RBC: 3.28 MIL/uL — ABNORMAL LOW (ref 4.22–5.81)
RDW: 13.4 % (ref 11.5–15.5)
WBC: 5.5 10*3/uL (ref 4.0–10.5)
nRBC: 0 % (ref 0.0–0.2)

## 2020-11-18 NOTE — TOC Transition Note (Signed)
Transition of Care [facility]) - CM/SW Discharge Note   Patient Details  Name: Larry Callahan MRN: [id number] Date of Birth: December 03, 1955  [facility] CM/SW Contact:  Shelbie Hutching, RN Phone Number: 11/18/2020, 10:36 AM   Clinical Narrative:    Patient medically cleared for discharge home today.  Patient agrees with having home health PT set up.  Sarah with [facility] has accepted referral for HHPT.  Patient has a walker at home.  He does need transportation at home.  Once discharge has been completed RNCM will arrange [facility] to take patient home.    Final next level of care: [place] Barriers to Discharge: Barriers Resolved   Patient Goals and CMS Choice Patient states their goals for this hospitalization and ongoing recovery are:: To go back home tomorrow CMS Medicare.gov Compare Post Acute Care list provided to:: Patient Choice offered to / list presented to : Patient  Discharge Placement                       Discharge Plan and Services   Discharge Planning Services: CM Consult            DME Arranged: N/A DME Agency: NA       HH Arranged: PT HH Agency: [facility] Date [place] Agency Contacted: 11/18/20 Time [place]: [phone] Representative spoke with at [place]: [place] Determinants of Health ([place]) Interventions     Readmission Risk Interventions Readmission Risk Prevention Plan 02/01/2020 01/31/2020 05/10/2019  Transportation Screening Complete Complete Complete  Medication Review Press photographer) Complete Complete Complete  PCP or Specialist appointment within 3-5 days of discharge Complete Complete -  [place] or Home Care Consult Complete Complete -  SW Recovery Care/Counseling Consult Complete Complete -  SW Consult Not Complete Comments - - -  Palliative Care Screening Not Applicable Not Applicable Not [place] Not Applicable Not Applicable Not Applicable  Some  recent data might be hidden

## 2020-11-18 NOTE — Progress Notes (Signed)
Physical Therapy Treatment Patient Details Name: Larry Callahan MRN: [id number] DOB: 17-Feb-1956 Today's Date: 11/18/2020    History of Present Illness 65 y.o. male with medical history significant of CKD 3B, CHF, COPD, CVA, diabetes, hypertension, GERD, hyperlipidemia, anemia, obesity, history of postural dizziness and presyncope, who presents after episode of syncope and hit his head.  He initially felt fine and was able to get groceries in the home but started having more pain and he came to the hospital.    PT Comments    Pt did well with ambulation and was able to maintain community appropriate speed w/o AD and good safety/confidence for ~400 ft.  He was also able to manage up/down steps with single UE rail use and reciprocal pattern.  He endorses still not quite feeling back to his baseline and is interested in HHPT to further assist with transition back home and get back to baseline/PLOF.  Pt safe to go home, expected d/c today.   Follow Up Recommendations  Home health PT     Equipment Recommendations  None recommended by PT    Recommendations for Other Services       Precautions / Restrictions Precautions Precautions: Fall Restrictions Weight Bearing Restrictions: No    Mobility  Bed Mobility               General bed mobility comments: not assessed, in recliner at beginning/end of session    Transfers Overall transfer level: Modified independent Equipment used: None             General transfer comment: Pt able to rise w/o AD and no LOB  Ambulation/Gait Ambulation/Gait assistance: Modified independent (Device/Increase time) Gait Distance (Feet): 400 Feet Assistive device: None       General Gait Details: Pt able to assume consistent cadence without walker, no LOBs.  His O2 and HR remained appropriate and consistent and he did not have excessive fatigue``.  Pt able to maintain conversation and did not have any c/o dizziness, etc during the  effort.   Stairs Stairs: Yes Stairs assistance: Independent;Modified independent (Device/Increase time) Stair Management: One rail Left Number of Stairs: 6 General stair comments: pt easily negotiated up/down steps reciprocally and with minimal UE reliance   Wheelchair Mobility    Modified Rankin (Stroke Patients Only)       Balance Overall balance assessment: Modified Independent Sitting-balance support: No upper extremity supported;Feet supported Sitting balance-Leahy Scale: Normal       Standing balance-Leahy Scale: Good                              Cognition Arousal/Alertness: Awake/alert Behavior During Therapy: WFL for tasks assessed/performed;Impulsive Overall Cognitive Status: Within Functional Limits for tasks assessed                                        Exercises      General Comments        Pertinent Vitals/Pain Pain Assessment: No/denies pain    Home Living                      Prior Function            PT Goals (current goals can now be found in the care plan section) Progress towards PT goals: Progressing toward goals    Frequency  Min 2X/week      PT Plan Current plan remains appropriate    Co-evaluation              AM-PAC PT "6 Clicks" Mobility   Outcome Measure  Help needed turning from your back to your side while in a flat bed without using bedrails?: None Help needed moving from lying on your back to sitting on the side of a flat bed without using bedrails?: None Help needed moving to and from a bed to a chair (including a wheelchair)?: A Little Help needed standing up from a chair using your arms (e.g., wheelchair or bedside chair)?: A Little Help needed to walk in hospital room?: A Little Help needed climbing 3-5 steps with a railing? : A Little 6 Click Score: 20    End of Session Equipment Utilized During Treatment: Gait belt Activity Tolerance: Patient tolerated  treatment well Patient left: with call bell/phone within reach;in chair Nurse Communication: Mobility status PT Visit Diagnosis: Muscle weakness (generalized) (M62.81);Unsteadiness on feet (R26.81);Difficulty in walking, not elsewhere classified (R26.2);History of falling (Z91.81)     Time: 1191-4782 PT Time Calculation (min) (ACUTE ONLY): 9 min  Charges:  $Gait Training: 8-22 mins                     Kreg Shropshire, DPT 11/18/2020, 11:14 AM

## 2020-11-18 NOTE — Plan of Care (Signed)
Pt orientedx4, on RA but placed on 2L [place] sleeping d/t not having correct CPAP mask. No c/o pain. VSS. Independent with ambulation and toileting. Fall/safety precautions in place, rounding performed.  Problem: Education: Goal: Knowledge of General Education information will improve Description: Including pain rating scale, medication(s)/side effects and non-pharmacologic comfort measures Outcome: Progressing   Problem: Health Behavior/Discharge Planning: Goal: Ability to manage health-related needs will improve Outcome: Progressing   Problem: Clinical Measurements: Goal: Ability to maintain clinical measurements within normal limits will improve Outcome: Progressing Goal: Will remain free from infection Outcome: Progressing Goal: Diagnostic test results will improve Outcome: Progressing Goal: Respiratory complications will improve Outcome: Progressing Goal: Cardiovascular complication will be avoided Outcome: Progressing   Problem: Nutrition: Goal: Adequate nutrition will be maintained Outcome: Progressing   Problem: Activity: Goal: Risk for activity intolerance will decrease Outcome: Progressing   Problem: Elimination: Goal: Will not experience complications related to bowel motility Outcome: Progressing Goal: Will not experience complications related to urinary retention Outcome: Progressing   Problem: Pain Managment: Goal: General experience of comfort will improve Outcome: Progressing   Problem: Safety: Goal: Ability to remain free from injury will improve Outcome: Progressing

## 2020-11-18 NOTE — Discharge Summary (Signed)
Physician Discharge Summary  Larry Callahan PIR:[id number] DOB: 17-Oct-1955 DOA: 11/16/2020  PCP: Lavera Guise, MD  Admit date: 11/16/2020 Discharge date: 11/18/2020  Admitted From: home Disposition:  Home w/ home health   Recommendations for Outpatient Follow-up:  1. Follow up with PCP in 1-2 weeks   Home Health: yes Equipment/Devices:  Discharge Condition: stable  CODE STATUS: full  Diet recommendation: Heart Healthy / Carb Modified  Brief/Interim Summary: HPI was taken from Dr. Trilby Drummer: Larry Callahan is a 65 y.o. male with medical history significant of CKD 3B, CHF, COPD, CVA, diabetes, hypertension, GERD, hyperlipidemia, anemia, obesity, history of postural dizziness and presyncope, who presents with episode of syncope and pain.             Patient had episode of syncope earlier today he had been out doing errands and was at [facility] when he had an episode of syncope where he fell and hit his head.  He denies any prodrome.  He had walked around to the window as the door was locked and had an episode of syncope there.  He did hit his head as above, but needed to get some groceries home and take care of his cat. He initially felt okay and went home but then began to have head neck and hip pain and came to the ED for further evaluation.             He is able to ambulate.  He has a prior history of postural dizziness and presyncope listed in chart.  However he did not have any prodrome as above. Denies fevers, chills, chest pain, shortness of breath, abdominal pain, constipation, diarrhea, nausea, vomiting.  ED Course: Vital signs in the ED significant for blood pressure in the 630Z to 601U systolic, heart rate in the 70s to 90s, respiratory rate in the teens to 20s.  Lab work-up showed BMP with BUN stable at 39 and creatinine stable at 2, bicarb 19, calcium 8.8.  CBC with hemoglobin stable at 10.7.  Troponin 16 and 19 on repeat.  Respiratory panel for flu and COVID pending.   Imaging work-up included chest x-ray which showed no acute abnormality, CT head which showed no acute abnormality, CT C-spine which showed degenerative disc disease but no acute abnormality, CT L-spine which showed stable chronic changes but no acute abnormality.  Hip x-ray on the right side which showed no acute abnormality but did demonstrate osteoarthritis.  Patient received dose of Tylenol and oxycodone in the ED.   Hospital course from Dr. Jimmye Norman 6/7-11/18/20: Pt presented after syncopal episode of unknown etiology. Work-up inpatient was neg. CT head showed no acute intracranial abnormality. CT C-spine showed multilevel degenerative disc disease & facet hypertrophy throughout cervical spine. CT L spine showed mild diffuse disc bulge & facet hypertrophy, similar to prior exam. Orthostatic vitals were neg. Tele only showed NSR, no arrhythmias noted. PT evaluated the pt and recommended home health. Home health was set up by CM prior to d/c. For more information, please see previous progress/consult notes.   Discharge Diagnoses:  Principal Problem:   Syncope and collapse Active Problems:   COPD (chronic obstructive pulmonary disease) (HCC)   Essential hypertension   Type 2 diabetes mellitus with hyperglycemia (HCC)   Chronic systolic CHF (congestive heart failure) (HCC)   Chronic kidney disease, stage 3b (HCC)   Acute on chronic diastolic CHF (congestive heart failure) (HCC)   Gastroesophageal reflux disease without esophagitis   History of CVA (cerebrovascular accident)  HLD (hyperlipidemia)    Syncope: etiology unclear, possibly arrhythmogenic but tele only showed NSR. CT head showed no acute intracranial abnormality. CT C-spine showed multilevel degenerative disc disease & facet hypertrophy throughout cervical spine. CT L spine showed mild diffuse disc bulge & facet hypertrophy, similar to prior exam. Continue on tele   Chronic systolic CHF: last echo in 09/2020 showed EF 94-17%, normal  diastolic function. Continue on home dose of lasix, coreg, imdur  CKDIIIb: Cr is labile. Will continue to monitor   DM2: likely poorly controlled. Continue on lantus, SSI w/ accuchecks   HTN: uncontrolled. Continue on home dose of coreg, imdur, hydralazine   COPD: w/o exacerbation. Continue on bronchodilators  GERD: continue on PPI   HLD: continue on statin   Hx of CVA: continue on aspirin, statin    Obesity: BMI 38.6. Complicates overall care and prognosis   Discharge Instructions  Discharge Instructions    Diet - low sodium heart healthy   Complete by: As directed    Diet Carb Modified   Complete by: As directed    Discharge instructions   Complete by: As directed    F/u w/ PCP in 1-2 weeks   Increase activity slowly   Complete by: As directed      Allergies as of 11/18/2020      Reactions   Penicillins Rash      Medication List    TAKE these medications   albuterol 108 (90 Base) MCG/ACT inhaler Commonly known as: VENTOLIN HFA Inhale 2 puffs into the lungs every 6 (six) hours as needed for wheezing or shortness of breath.   aspirin 81 MG EC tablet Take 1 tablet (81 mg total) by mouth daily. Swallow whole.   budesonide-formoterol 160-4.5 MCG/ACT inhaler Commonly known as: SYMBICORT Inhale 2 puffs into the lungs 2 (two) times daily.   carvedilol 6.25 MG tablet Commonly known as: COREG Take 1 tablet (6.25 mg total) by mouth 2 (two) times daily with a meal.   cetirizine 10 MG tablet Commonly known as: ZYRTEC Take 10 mg by mouth daily.   escitalopram 10 MG tablet Commonly known as: Lexapro Take one tab po qd for anxiety with food What changed:   how much to take  how to take this  when to take this   ferrous gluconate 324 MG tablet Commonly known as: FERGON Take 1 tablet (324 mg total) by mouth daily with breakfast.   furosemide 40 MG tablet Commonly known as: LASIX Take one to tabs once or twice days for fluid // swelling   gabapentin 100 MG  capsule Commonly known as: NEURONTIN Take 1 capsule (100 mg total) by mouth in the morning, at noon, and at bedtime.   glimepiride 2 MG tablet Commonly known as: AMARYL Take one tab po qd with supper   hydrALAZINE 100 MG tablet Commonly known as: APRESOLINE Take 1 tablet (100 mg total) by mouth 3 (three) times daily.   insulin glargine (1 Unit Dial) 300 UNIT/ML Solostar Pen Commonly known as: TOUJEO Inject 26 Units into the skin daily.   ipratropium-albuterol 0.5-2.5 (3) MG/3ML Soln Commonly known as: DUONEB Take 3 mLs by nebulization every 6 (six) hours as needed.   isosorbide mononitrate 30 MG 24 hr tablet Commonly known as: IMDUR Take 1 tablet (30 mg total) by mouth 2 (two) times daily.   mupirocin ointment 2 % Commonly known as: BACTROBAN Apply 1 application topically daily. Qd to excision site   Novofine Pen Needle 32G X 6 MM  Misc Generic drug: Insulin Pen Needle Use as directed   omeprazole 40 MG capsule Commonly known as: PRILOSEC Take 1 capsule (40 mg total) by mouth in the morning and at bedtime.   potassium chloride SA 20 MEQ tablet Commonly known as: KLOR-CON Take 1 tablet (20 mEq total) by mouth 2 (two) times daily.   Spiriva HandiHaler 18 MCG inhalation capsule Generic drug: tiotropium Place 1 capsule (18 mcg total) into inhaler and inhale daily.   True Metrix Air Glucose Meter Devi 1 Device by Does not apply route 2 (two) times daily.   True Metrix Blood Glucose Test test strip Generic drug: glucose blood Use as instructed twice daily diag E11.65   True Metrix Level 1 Low Soln Use as directed   TRUEplus Lancets 33G Misc Use as directed twice daily diag E11.65       Follow-up [facility].   Specialty: Home Health Services Why: Home Health Physical Therapy has been arranged with [facility]- they will be calling you to set up initial visit.  Contact information: [place] [address] [phone]              Allergies  Allergen Reactions  . Penicillins Rash    Consultations:     Procedures/Studies: DG Chest 2 View  Result Date: 11/16/2020 CLINICAL DATA:  Syncopal episode. EXAM: CHEST - 2 VIEW COMPARISON:  Oct 31, 2020 FINDINGS: The heart size and mediastinal contours are within normal limits. Both lungs are clear. The visualized skeletal structures are unremarkable. IMPRESSION: No active cardiopulmonary disease. Electronically Signed   By: Virgina Norfolk M.D.   On: 11/16/2020 17:07   DG Chest 2 View  Result Date: 10/31/2020 CLINICAL DATA:  Shortness of breath. EXAM: CHEST - 2 VIEW COMPARISON:  Oct 30, 2020. FINDINGS: The heart size and mediastinal contours are within normal limits. Both lungs are clear. The visualized skeletal structures are unremarkable. IMPRESSION: No active cardiopulmonary disease. Electronically Signed   By: Marijo Conception M.D.   On: 10/31/2020 19:47   DG Chest 2 View  Result Date: 10/30/2020 CLINICAL DATA:  Chest pain and shortness of breath.  Hematemesis. EXAM: CHEST - 2 VIEW COMPARISON:  11/04/2020 FINDINGS: Heart size upper limits of normal. Atherosclerosis and tortuosity of the aorta. Pulmonary venous hypertension without frank edema. No infiltrate, collapse or effusion. Ordinary degenerative changes affect the spine. IMPRESSION: Pulmonary venous hypertension pattern but without evidence of frank edema. No infiltrate, collapse or effusion. Aortic atherosclerosis. Electronically Signed   By: Nelson Chimes M.D.   On: 10/30/2020 11:26   CT Head Wo Contrast  Result Date: 11/16/2020 CLINICAL DATA:  Head trauma. Near syncopal episode. Headache and neck pain. EXAM: CT HEAD WITHOUT CONTRAST TECHNIQUE: Contiguous axial images were obtained from the base of the skull through the vertex without intravenous contrast. COMPARISON:  Head CT 09/25/2020 FINDINGS: Brain: No intracranial hemorrhage, mass effect, or midline  shift. No hydrocephalus. The basilar cisterns are patent. No evidence of territorial infarct or acute ischemia. No extra-axial or intracranial fluid collection. Vascular: No hyperdense vessel. Skull: No fracture or focal lesion. Sinuses/Orbits: Mucous retention cysts in the right maxillary sinus. No acute findings. Other: None. IMPRESSION: No acute intracranial abnormality. No skull fracture. Electronically Signed   By: Keith Rake M.D.   On: 11/16/2020 18:13   CT Cervical Spine Wo Contrast  Result Date: 11/16/2020 CLINICAL DATA:  Neck trauma (Age >=  65y) Near syncope.  Headache and neck pain. EXAM: CT CERVICAL SPINE WITHOUT CONTRAST TECHNIQUE: Multidetector CT imaging of the cervical spine was performed without intravenous contrast. Multiplanar CT image reconstructions were also generated. COMPARISON:  None. FINDINGS: Alignment: No traumatic finding. Exaggerated cervical lordosis. Trace anterolisthesis of C2 on C3 and C6 on C7. Skull base and vertebrae: No acute fracture. Vertebral body heights are maintained. The dens and skull base are intact. Soft tissues and spinal canal: No prevertebral fluid or swelling. No visible canal hematoma. Disc levels: Multilevel degenerative disc disease. Disc space narrowing and endplate spurring at Z6-X0, C4-C5, C6-C7. There is multilevel facet hypertrophy. Upper chest: No acute findings. Other: None. IMPRESSION: Multilevel degenerative disc disease and facet hypertrophy throughout the cervical spine. No acute fracture or subluxation. Electronically Signed   By: Keith Rake M.D.   On: 11/16/2020 18:17   CT Lumbar Spine Wo Contrast  Result Date: 11/16/2020 CLINICAL DATA:  Low back pain, trauma EXAM: CT LUMBAR SPINE WITHOUT CONTRAST TECHNIQUE: Multidetector CT imaging of the lumbar spine was performed without intravenous contrast administration. Multiplanar CT image reconstructions were also generated. COMPARISON:  Lumbar CT 06/17/2019 FINDINGS: Segmentation: 5 lumbar  type vertebrae. Alignment: No traumatic subluxation. Trace anterolisthesis of L5 on S1. Vertebrae: No acute fracture or focal pathologic process. Paraspinal and other soft tissues: Right renal cysts. Left nephrectomy. Left adrenal cystic lesion which was fully included on abdominopelvic CT 10/30/2020. No significant change since that time. Aortic and branch atherosclerosis. Disc levels: Increased vacuum phenomena at L2-L3 and L4-L5 from prior CT. Mild diffuse disc bulge and facet hypertrophy, similar to prior exam. IMPRESSION: 1. No acute fracture or subluxation of the lumbar spine. 2. Increased vacuum phenomena at L2-L3 and L4-L5 from prior CT. Mild diffuse disc bulge and facet hypertrophy, similar to prior exam. Aortic Atherosclerosis (ICD10-I70.0). Electronically Signed   By: Keith Rake M.D.   On: 11/16/2020 18:21   DG HIP UNILAT WITH PELVIS 2-3 VIEWS RIGHT  Result Date: 11/16/2020 CLINICAL DATA:  Back pain radiating to the right hip EXAM: DG HIP (WITH OR WITHOUT PELVIS) 2-3V RIGHT COMPARISON:  Femur radiograph 10/15/2020 FINDINGS: Right hip joint space narrowing. Acetabular and femoral head neck osteophytes unchanged. No fracture. No evidence of a vascular necrosis or focal bone lesion. The pubic rami and remainder of the bony pelvis are intact. Pubic symphysis and sacroiliac joints are congruent. IMPRESSION: Right hip osteoarthritis.  No acute findings. Electronically Signed   By: Keith Rake M.D.   On: 11/16/2020 18:30   CT CHEST ABDOMEN PELVIS WO CONTRAST  Result Date: 10/30/2020 CLINICAL DATA:  65 year old male with history of hematemesis and hemoptysis. Substernal chest pain and cough. Shortness of breath. EXAM: CT CHEST, ABDOMEN AND PELVIS WITHOUT CONTRAST TECHNIQUE: Multidetector CT imaging of the chest, abdomen and pelvis was performed following the standard protocol without IV contrast. COMPARISON:  CTA of the chest 01/24/2020. CT the abdomen and pelvis 12/10/2019. FINDINGS: CT CHEST  FINDINGS Cardiovascular: Heart size is normal. There is no significant pericardial fluid, thickening or pericardial calcification. There is aortic atherosclerosis, as well as atherosclerosis of the great vessels of the mediastinum and the coronary arteries, including calcified atherosclerotic plaque in the left main, left anterior descending and left circumflex coronary arteries. Mediastinum/Nodes: No pathologically enlarged mediastinal or hilar lymph nodes. Please note that accurate exclusion of hilar adenopathy is limited on noncontrast CT scans. Esophagus is unremarkable in appearance. No axillary lymphadenopathy. Lungs/Pleura: No suspicious appearing pulmonary nodules or masses are noted. No acute consolidative  airspace disease. No pleural effusions. Musculoskeletal: There are no aggressive appearing lytic or blastic lesions noted in the visualized portions of the skeleton. CT ABDOMEN PELVIS FINDINGS Hepatobiliary: No definite suspicious cystic or solid hepatic lesions are confidently identified on today's noncontrast CT examination. Status post cholecystectomy. Pancreas: No definite pancreatic mass or peripancreatic fluid collections or inflammatory changes noted on today's noncontrast CT examination. Spleen: Multiple calcified granulomas scattered throughout the spleen. Adrenals/Urinary Tract: Status post left radical nephrectomy. Multiple right renal lesions, incompletely characterized on today's non-contrast CT examination, but statistically likely to represent cysts, largest of which measures 7.1 cm in the posterior aspect of the interpolar region of the right kidney. Additional complex lesion in the lower pole of the right kidney which demonstrates some internal high attenuation measuring 2.9 cm in diameter (coronal image one hundred eight of series 5). Right adrenal gland is normal. 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 No hydroureteronephrosis. Urinary bladder is normal in appearance. Stomach/Bowel: The appearance of the stomach is normal. There is no pathologic dilatation of small bowel or colon. Numerous colonic diverticulae are noted, particularly in the sigmoid colon, without surrounding phlegm a tori changes to suggest an acute diverticulitis at this time. Normal appendix. Vascular/Lymphatic: Aortic atherosclerosis. No lymphadenopathy noted in the abdomen or pelvis. Reproductive: Prostate gland and seminal vesicles are unremarkable in appearance. Other: Large umbilical hernia containing only omental fat. No significant volume of ascites. No pneumoperitoneum. Musculoskeletal: There are no aggressive appearing lytic or blastic lesions noted in the visualized portions of the skeleton. IMPRESSION: 1. No acute findings are noted in the chest, abdomen or pelvis to account for the patient's hematemesis or hemoptysis. 2. Slowly growing left adrenal lesion which may simply represent a mildly complex adrenal cyst, however, the possibility of a slowly growing adrenal neoplasm warrants consideration. Further evaluation with nonemergent adrenal protocol CT scan is recommended in the near future to provide more definitive characterization. 3. At the time of follow-up adrenal protocol CT, attention to the indeterminate 2.9 cm lesion in the lower pole of the right kidney is recommended to exclude neoplasm. 4. Colonic diverticulosis without evidence of acute diverticulitis at this time. 5. Large umbilical hernia containing only omental fat. No associated bowel incarceration or obstruction at this time. 6. Aortic atherosclerosis, in addition to left main and 2 vessel coronary artery disease. Please note  that although the presence of coronary artery calcium documents the presence of coronary artery disease, the severity of this disease and any potential stenosis cannot be assessed on this non-gated CT examination. Assessment for potential risk factor modification, dietary therapy or pharmacologic therapy may be warranted, if clinically indicated. 7. Additional incidental findings, as above. Electronically Signed   By: Vinnie Langton M.D.   On: 10/30/2020 15:15      Subjective: Pt c/o fatigue    Discharge Exam: Vitals:   11/18/20 0438 11/18/20 0742  BP: (!) 101/50 137/70  Pulse: 67 68  Resp: 19 18  Temp: 97.9 F (36.6 C) 97.7 F (36.5 C)  SpO2: 95% 97%   Vitals:   11/18/20 0036 11/18/20 0103 11/18/20 0438 11/18/20 0742  BP: (!) 101/48  (!) 101/50 137/70  Pulse: 69  67 68  Resp: 18  19 18  Temp: (!) 97.4 F (36.3 C)  97.9 F (36.6 C) 97.7 F (36.5 C)  TempSrc: Oral   Oral  SpO2: 97%  95% 97%  Weight:  125.8 kg    Height:        General: Pt is alert, awake, not in acute distress Cardiovascular: S1/S2 +, no rubs, no gallops Respiratory: CTA bilaterally, no wheezing, no rhonchi Abdominal: Soft, NT, obese, bowel sounds + Extremities: no edema, no cyanosis    The results of significant diagnostics from this hospitalization (including imaging, microbiology, ancillary and laboratory) are listed below for reference.     Microbiology: Recent Results (from the past 240 hour(s))  Resp Panel by RT-PCR (Flu A&B, Covid) Nasopharyngeal Swab     Status: None   Collection Time: 11/16/20  4:13 PM   Specimen: Nasopharyngeal Swab; Nasopharyngeal(NP) swabs in vial transport medium  Result Value Ref Range Status   SARS Coronavirus 2 by RT PCR NEGATIVE NEGATIVE Final    Comment: (NOTE) SARS-CoV-2 target nucleic acids are NOT DETECTED.  The SARS-CoV-2 RNA is generally detectable in upper respiratory specimens during the acute phase of infection. The lowest concentration of SARS-CoV-2  viral copies this assay can detect is 138 copies/mL. A negative result does not preclude SARS-Cov-2 infection and should not be used as the sole basis for treatment or other patient management decisions. A negative result may occur with  improper specimen collection/handling, submission of specimen other than nasopharyngeal swab, presence of viral mutation(s) within the areas targeted by this assay, and inadequate number of viral copies(<138 copies/mL). A negative result must be combined with clinical observations, patient history, and epidemiological information. The expected result is Negative.  Fact Sheet for Patients:  EntrepreneurPulse.com.au  Fact Sheet for Healthcare Providers:  IncredibleEmployment.be  This test is no t yet approved or cleared by the [place] FDA and  has been authorized for detection and/or diagnosis of SARS-CoV-2 by FDA under an Emergency Use Authorization (EUA). This EUA will remain  in effect (meaning this test can be used) for the duration of the COVID-19 declaration under Section 564(b)(1) of the Act, 21 U.S.C.section 360bbb-3(b)(1), unless the authorization is terminated  or revoked sooner.       Influenza A by PCR NEGATIVE NEGATIVE Final   Influenza B by PCR NEGATIVE NEGATIVE Final    Comment: (NOTE) The Xpert Xpress SARS-CoV-2/FLU/RSV plus assay is intended as an aid in the diagnosis of influenza from Nasopharyngeal swab specimens and should not be used as a sole basis for treatment. Nasal washings and aspirates are unacceptable for Xpert Xpress SARS-CoV-2/FLU/RSV testing.  Fact Sheet for Patients: EntrepreneurPulse.com.au  Fact Sheet for Healthcare Providers: IncredibleEmployment.be  This test is not yet approved or cleared by the [place] FDA and has been authorized for detection and/or diagnosis of SARS-CoV-2 by FDA under an Emergency Use Authorization  (EUA). This EUA will remain in effect (meaning this test can be used) for the duration of the COVID-19 declaration under Section 564(b)(1) of the Act, 21 U.S.C. section 360bbb-3(b)(1), unless the authorization is terminated or revoked.  Performed at [facility], [address] [place], [place] [address]      Labs: BNP (last 3 results) Recent Labs    07/20/20 1652 10/06/20 1428 10/31/20 1900  BNP 84.6 81.7 25.3   Basic Metabolic Panel: Recent Labs  Lab 11/16/20 1613 11/17/20 0422 11/18/20 0504  NA 135 134* 135  K 4.1 4.0 4.0  CL 108 108 107  CO2 19* 21*  19*  GLUCOSE 103* 147* 120*  BUN 39* 43* 46*  CREATININE 2.00* 2.19* 2.38*  CALCIUM 8.8* 8.7* 8.4*  MG 1.7  --   --    Liver Function Tests: No results for input(s): AST, ALT, ALKPHOS, BILITOT, PROT, ALBUMIN in the last 168 hours. No results for input(s): LIPASE, AMYLASE in the last 168 hours. No results for input(s): AMMONIA in the last 168 hours. CBC: Recent Labs  Lab 11/16/20 1613 11/17/20 0422 11/18/20 0504  WBC 7.9 6.5 5.5  HGB 10.7* 9.6* 9.3*  HCT 32.7* 30.0* 28.4*  MCV 86.7 89.0 86.6  PLT 183 156 165   Cardiac Enzymes: No results for input(s): CKTOTAL, CKMB, CKMBINDEX, TROPONINI in the last 168 hours. BNP: Invalid input(s): POCBNP CBG: Recent Labs  Lab 11/17/20 1238 11/17/20 1638 11/17/20 2126 11/18/20 0440 11/18/20 0814  GLUCAP 292* 138* 128* 129* 146*   D-Dimer No results for input(s): DDIMER in the last 72 hours. Hgb A1c No results for input(s): HGBA1C in the last 72 hours. Lipid Profile No results for input(s): CHOL, HDL, LDLCALC, TRIG, CHOLHDL, LDLDIRECT in the last 72 hours. Thyroid function studies No results for input(s): TSH, T4TOTAL, T3FREE, THYROIDAB in the last 72 hours.  Invalid input(s): FREET3 Anemia work up No results for input(s): VITAMINB12, FOLATE, FERRITIN, TIBC, IRON, RETICCTPCT in the last 72 hours. Urinalysis    Component Value Date/Time    COLORURINE YELLOW (A) 09/25/2020 1643   APPEARANCEUR HAZY (A) 09/25/2020 1643   APPEARANCEUR Cloudy (A) 11/23/2018 1103   LABSPEC 1.012 09/25/2020 1643   LABSPEC 1.005 03/06/2013 0929   PHURINE 5.0 09/25/2020 1643   GLUCOSEU 150 (A) 09/25/2020 1643   GLUCOSEU Negative 03/06/2013 0929   HGBUR NEGATIVE 09/25/2020 1643   BILIRUBINUR NEGATIVE 09/25/2020 1643   BILIRUBINUR negative 02/20/2019 1404   BILIRUBINUR Negative 11/23/2018 1103   BILIRUBINUR Negative 03/06/2013 0929   KETONESUR NEGATIVE 09/25/2020 1643   PROTEINUR >=300 (A) 09/25/2020 1643   UROBILINOGEN 0.2 02/20/2019 1404   NITRITE NEGATIVE 09/25/2020 1643   LEUKOCYTESUR NEGATIVE 09/25/2020 1643   LEUKOCYTESUR Negative 03/06/2013 0929   Sepsis Labs Invalid input(s): PROCALCITONIN,  WBC,  LACTICIDVEN Microbiology Recent Results (from the past 240 hour(s))  Resp Panel by RT-PCR (Flu A&B, Covid) Nasopharyngeal Swab     Status: None   Collection Time: 11/16/20  4:13 PM   Specimen: Nasopharyngeal Swab; Nasopharyngeal(NP) swabs in vial transport medium  Result Value Ref Range Status   SARS Coronavirus 2 by RT PCR NEGATIVE NEGATIVE Final    Comment: (NOTE) SARS-CoV-2 target nucleic acids are NOT DETECTED.  The SARS-CoV-2 RNA is generally detectable in upper respiratory specimens during the acute phase of infection. The lowest concentration of SARS-CoV-2 viral copies this assay can detect is 138 copies/mL. A negative result does not preclude SARS-Cov-2 infection and should not be used as the sole basis for treatment or other patient management decisions. A negative result may occur with  improper specimen collection/handling, submission of specimen other than nasopharyngeal swab, presence of viral mutation(s) within the areas targeted by this assay, and inadequate number of viral copies(<138 copies/mL). A negative result must be combined with clinical observations, patient history, and epidemiological information. The expected  result is Negative.  Fact Sheet for Patients:  EntrepreneurPulse.com.au  Fact Sheet for Healthcare Providers:  IncredibleEmployment.be  This test is no t yet approved or cleared by the [place] FDA and  has been authorized for detection and/or diagnosis of SARS-CoV-2 by FDA under an Emergency Use Authorization (EUA). This  EUA will remain  in effect (meaning this test can be used) for the duration of the COVID-19 declaration under Section 564(b)(1) of the Act, 21 U.S.C.section 360bbb-3(b)(1), unless the authorization is terminated  or revoked sooner.       Influenza A by PCR NEGATIVE NEGATIVE Final   Influenza B by PCR NEGATIVE NEGATIVE Final    Comment: (NOTE) The Xpert Xpress SARS-CoV-2/FLU/RSV plus assay is intended as an aid in the diagnosis of influenza from Nasopharyngeal swab specimens and should not be used as a sole basis for treatment. Nasal washings and aspirates are unacceptable for Xpert Xpress SARS-CoV-2/FLU/RSV testing.  Fact Sheet for Patients: EntrepreneurPulse.com.au  Fact Sheet for Healthcare Providers: IncredibleEmployment.be  This test is not yet approved or cleared by the [place] FDA and has been authorized for detection and/or diagnosis of SARS-CoV-2 by FDA under an Emergency Use Authorization (EUA). This EUA will remain in effect (meaning this test can be used) for the duration of the COVID-19 declaration under Section 564(b)(1) of the Act, 21 U.S.C. section 360bbb-3(b)(1), unless the authorization is terminated or revoked.  Performed at [facility], [address], [place], [place] [address]      Time coordinating discharge: Over 30 minutes  SIGNED:   Wyvonnia Dusky, MD  [facility] 11/18/2020, 10:38 AM Pager   If 7PM-7AM, please contact night-coverage

## 2020-11-18 NOTE — Progress Notes (Signed)
Occupational Therapy Treatment Patient Details Name: Larry Callahan MRN: [id number] DOB: 1955/09/27 Today's Date: 11/18/2020    History of present illness 65 y.o. male with medical history significant of CKD 3B, CHF, COPD, CVA, diabetes, hypertension, GERD, hyperlipidemia, anemia, obesity, history of postural dizziness and presyncope, who presents after episode of syncope and hit his head.  He initially felt fine and was able to get groceries in the home but started having more pain and he came to the hospital.   OT comments  Pt seen for OT tx this date to f/u re: ADL mobility safety. OT engages pt in STS trials x3 from chair with no AD. Pt tolerates well, requires ~2min rest between and requires MIN verbal cues for safe technique. Pt verbalizes good understanding of importance of sequence to come to stand as it pertains to fall prevention.    Follow Up Recommendations  No OT follow up    Equipment Recommendations  None recommended by OT    Recommendations for Other Services      Precautions / Restrictions Precautions Precautions: Fall Restrictions Weight Bearing Restrictions: No       Mobility Bed Mobility               General bed mobility comments: not assessed, in recliner at beginning/end of session    Transfers Overall transfer level: Modified independent Equipment used: None             General transfer comment: Pt able to rise w/o AD and no LOB    Balance Overall balance assessment: Modified Independent Sitting-balance support: No upper extremity supported;Feet supported Sitting balance-Leahy Scale: Normal     Standing balance support: No upper extremity supported;During functional activity Standing balance-Leahy Scale: Good                             ADL either performed or assessed with clinical judgement   ADL                                               Vision Patient Visual Report: No change from  baseline     Perception     Praxis      Cognition Arousal/Alertness: Awake/alert Behavior During Therapy: WFL for tasks assessed/performed;Impulsive Overall Cognitive Status: Within Functional Limits for tasks assessed                                          Exercises Other Exercises Other Exercises: Pt engaged in 3 STS trials with cues for safe hand placement and control of descent. Pt with good undersatndign and participation.   Shoulder Instructions       General Comments      Pertinent Vitals/ Pain       Pain Assessment: No/denies pain  Home Living                                          Prior Functioning/Environment              Frequency  Min 1X/week        Progress Toward Goals  OT Goals(current  goals can now be found in the care plan section)  Progress towards OT goals: Progressing toward goals  Acute Rehab OT Goals Patient Stated Goal: to go home OT Goal Formulation: With patient Time For Goal Achievement: 12/01/20 Potential to Achieve Goals: Good  Plan Discharge plan remains appropriate    Co-evaluation                 AM-PAC OT "6 Clicks" Daily Activity     Outcome Measure   Help from another person eating meals?: None Help from another person taking care of personal grooming?: A Little Help from another person toileting, which includes using toliet, bedpan, or urinal?: A Little Help from another person bathing (including washing, rinsing, drying)?: A Little Help from another person to put on and taking off regular upper body clothing?: None Help from another person to put on and taking off regular lower body clothing?: A Little 6 Click Score: 20    End of Session Equipment Utilized During Treatment: Gait belt  OT Visit Diagnosis: Unsteadiness on feet (R26.81)   Activity Tolerance Patient tolerated treatment well   Patient Left in chair;with call bell/phone within reach   Nurse  Communication Mobility status        Time: 1051-1100 OT Time Calculation (min): 9 min  Charges: OT General Charges $OT Visit: 1 Visit OT Treatments $Therapeutic Activity: 8-22 mins  Gerrianne Scale, [place], OTR/L ascom [phone] 11/18/20, 2:14 PM

## 2020-11-18 NOTE — TOC Progression Note (Signed)
Transition of Care [facility]) - Progression Note    Patient Details  Name: Apostolos Blagg MRN: [id number] Date of Birth: June 18, 1955  [facility]) CM/SW Contact  Shelbie Hutching, RN Phone Number: 11/18/2020, 12:39 PM  Clinical Narrative:    [facility] transport arranged to take patient home ETA 1249.  Volunteer services taking patient to the front lobby via wheelchair.    Expected Discharge Plan: Home/Self Care Barriers to Discharge: Barriers Resolved  Expected Discharge Plan and Services Expected Discharge Plan: Home/Self Care   Discharge Planning Services: CM Consult   Living arrangements for the past 2 months: Mobile Home Expected Discharge Date: 11/18/20               DME Arranged: N/A DME Agency: NA       HH Arranged: PT HH Agency: [facility] Date [place]: 11/18/20 Time [place]: 1036 Representative spoke with at [place]: [place] Determinants of Health ([place]) Interventions    Readmission Risk Interventions Readmission Risk Prevention Plan 02/01/2020 01/31/2020 05/10/2019  Transportation Screening Complete Complete Complete  Medication Review Press photographer) Complete Complete Complete  PCP or Specialist appointment within 3-5 days of discharge Complete Complete -  [place] or Home Care Consult Complete Complete -  SW Recovery Care/Counseling Consult Complete Complete -  SW Consult Not Complete Comments - - -  Palliative Care Screening Not Applicable Not Applicable Not [place] Not Applicable Not Applicable Not Applicable  Some recent data might be hidden

## 2020-11-19 IMAGING — US US EXTREM LOW VENOUS
1 series · 14 of 24 positions shown · non-contrast
Comparison: None

CLINICAL DATA: Hemoptysis, shortness of breath

EXAM:
BILATERAL LOWER EXTREMITY VENOUS DOPPLER ULTRASOUND
TECHNIQUE: Gray-scale sonography with compression, as well as color and duplex
ultrasound, were performed to evaluate the deep venous system from
the level of the common femoral vein through the popliteal and
proximal calf veins.

[Series 1: us extrem low venous · 14 of 58 slices shown]
[im 1/58]
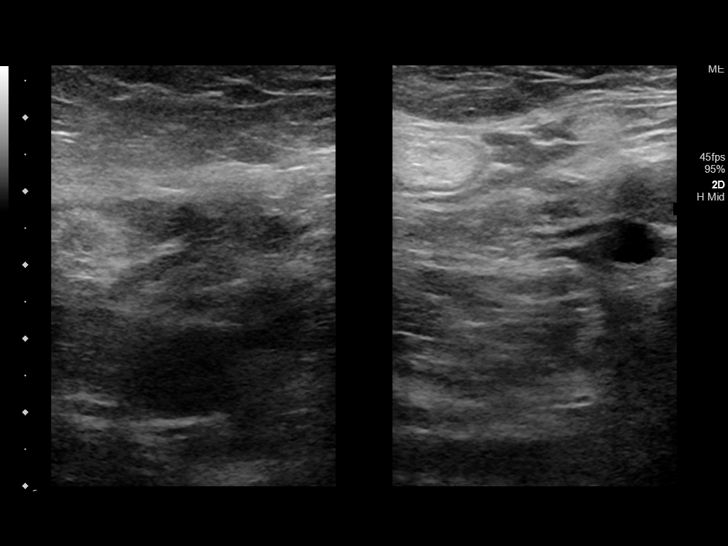
[im 5/58]
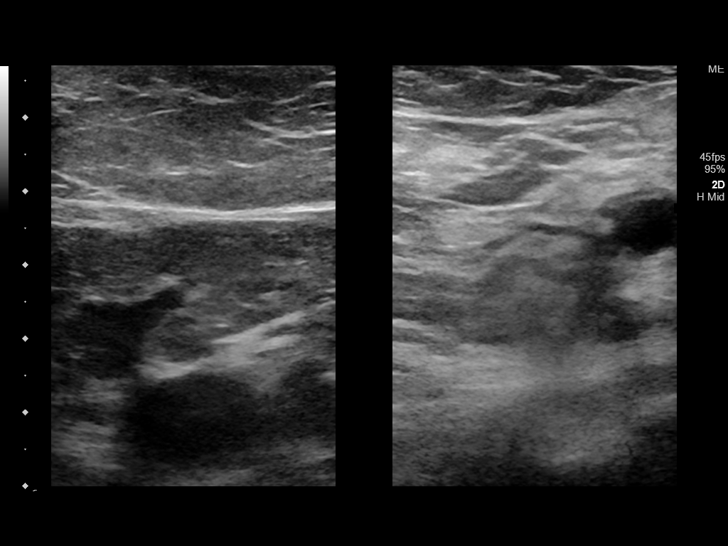
[im 10/58]
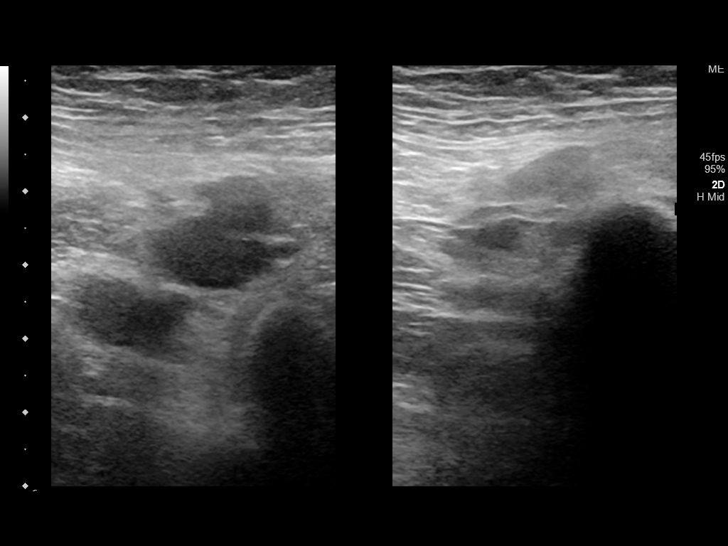
[im 15/58]
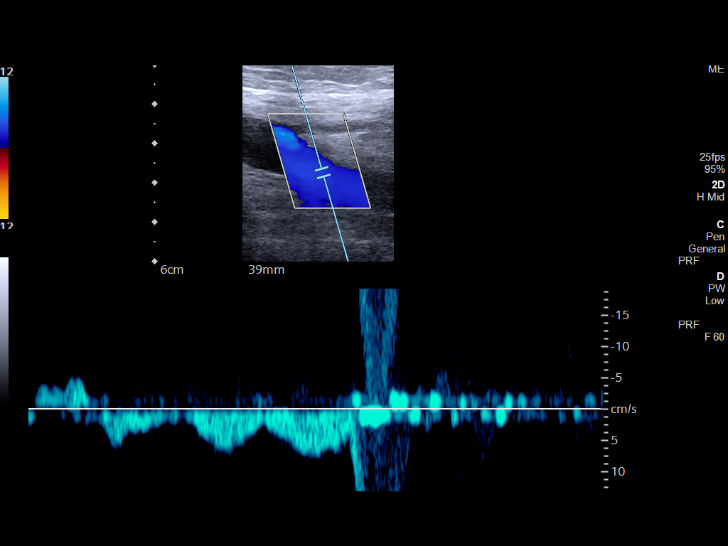
[im 18/58]
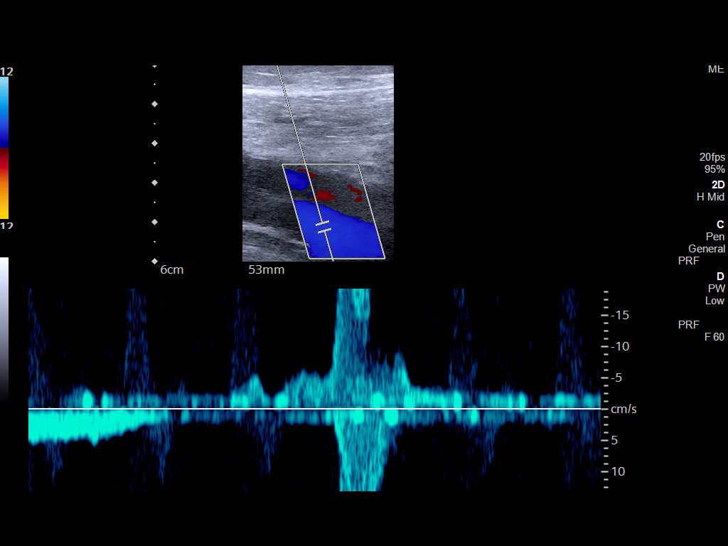
[im 23/58]
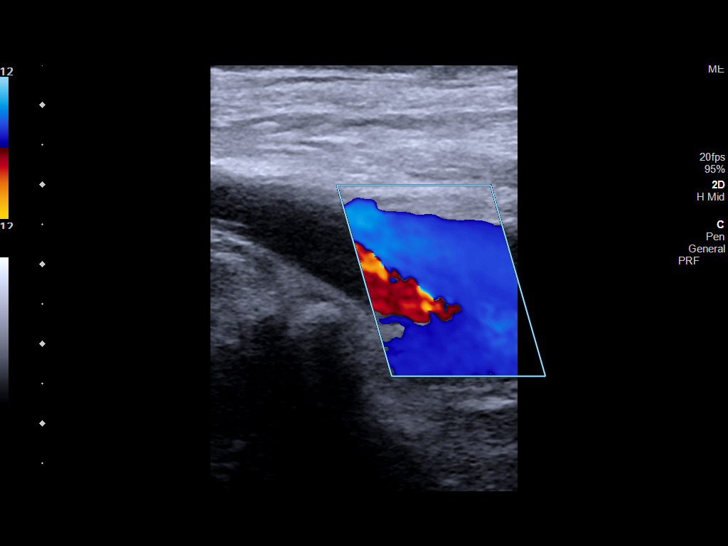
[im 28/58]
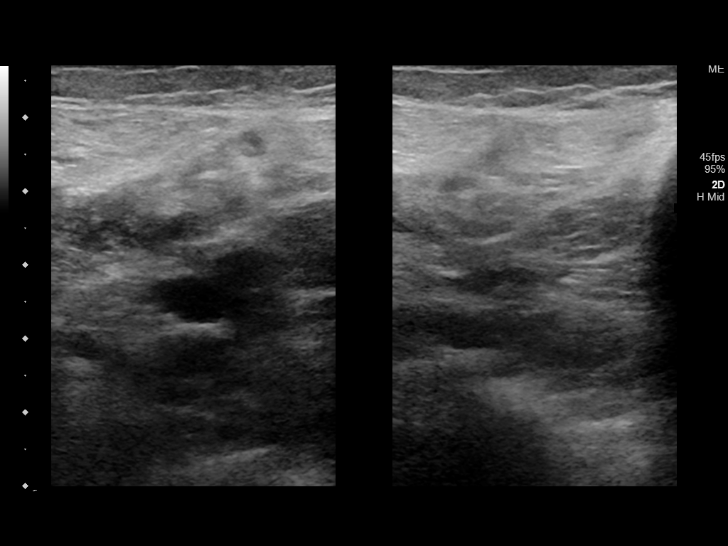
[im 30/58]
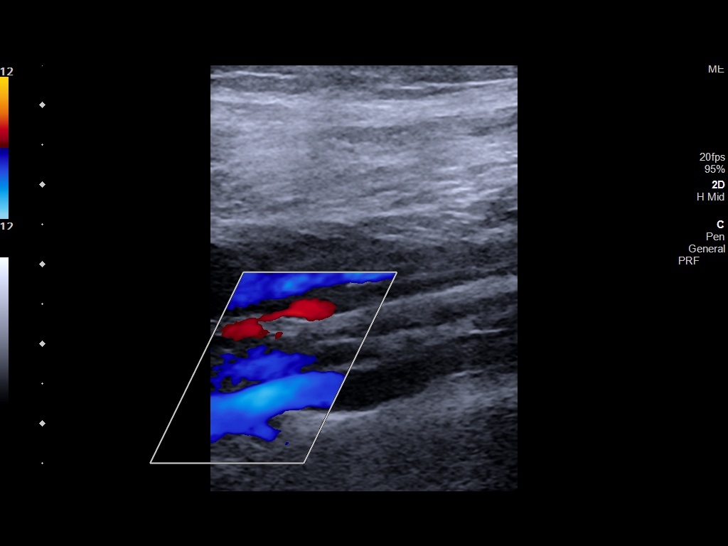
[im 35/58]
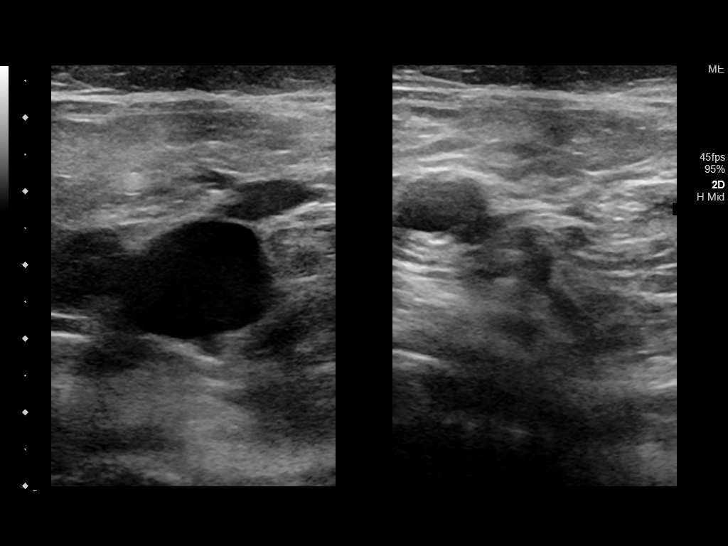
[im 40/58]
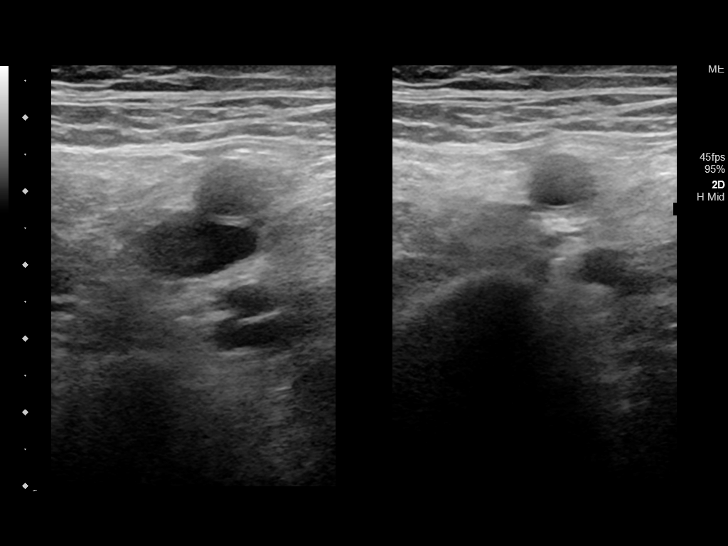
[im 45/58]
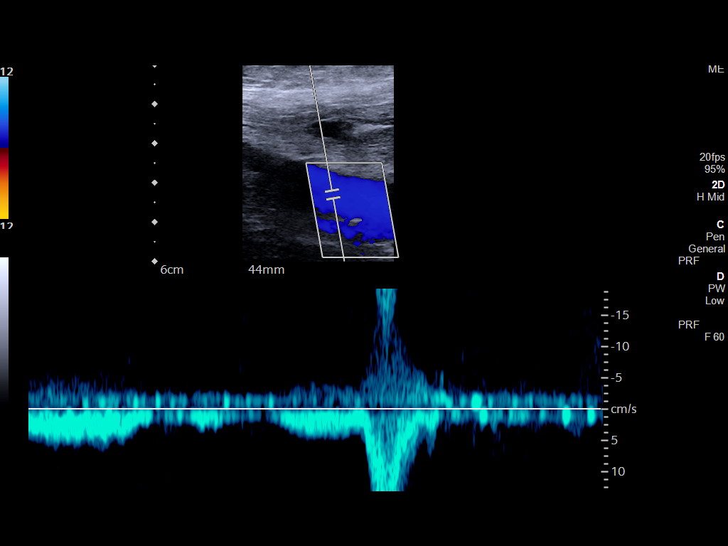
[im 48/58]
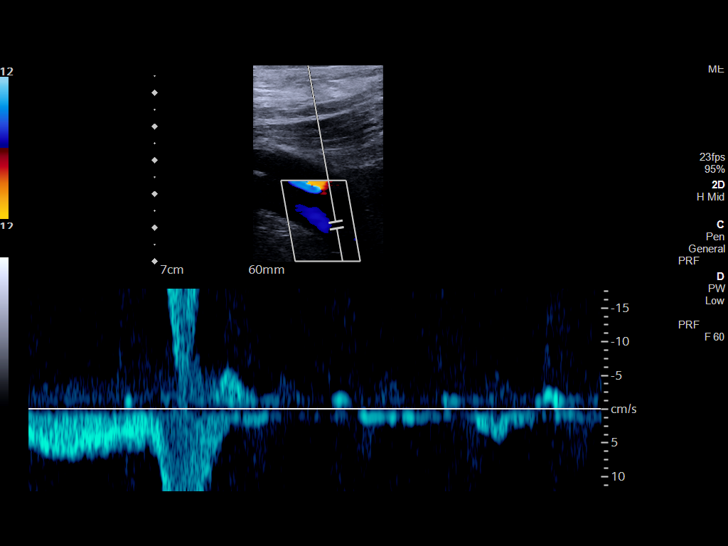
[im 53/58]
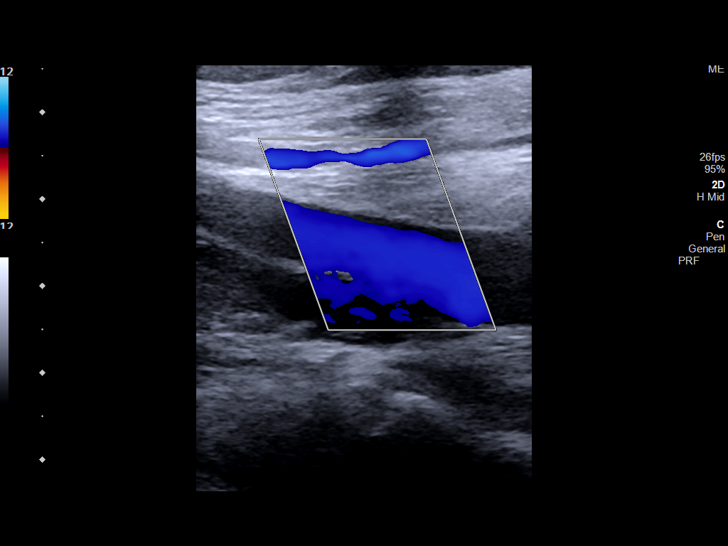
[im 58/58]
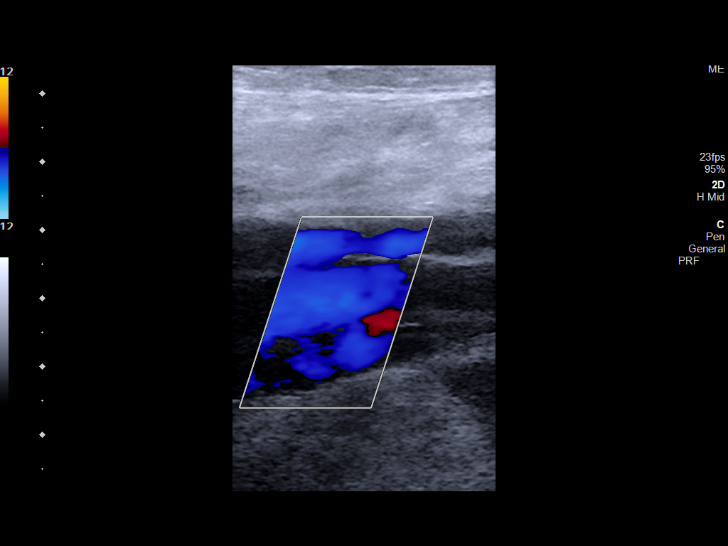

[14 of 24 positions shown; findings below may reference images not displayed]

FINDINGS: Normal compressibility of the common femoral, superficial femoral,
and popliteal veins, as well as the proximal calf veins. No filling
defects to suggest DVT on grayscale or color Doppler imaging.
Doppler waveforms show normal direction of venous flow, normal
respiratory phasicity and response to augmentation.
IMPRESSION: No femoropopliteal and no calf DVT in the visualized calf veins. If
clinical symptoms are inconsistent or if there are persistent or
worsening symptoms, further imaging (possibly involving the iliac
veins) may be warranted.

## 2020-11-19 IMAGING — NM NM PULMONARY VENT & PERF
2 series · 15 of 15 positions shown · non-contrast
Comparison: Chest radiograph April 09, 2019

CLINICAL DATA: Shortness of breath with hemoptysis

EXAM:
NUCLEAR MEDICINE VENTILATION - PERFUSION LUNG SCAN
VIEWS:
Anterior, posterior, left lateral, right lateral, RPO, LPO, RAO,
LAO-ventilation and perfusion
RADIOPHARMACEUTICALS:  30.10 mCi of 4c-99m DTPA aerosol inhalation
and 4.03 mCi CcVVm MAA IV

[Series 1000: lung ventilation · 3.90mm/px · 4 acquisitions, 7 frames shown]
[im 1/4]
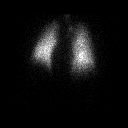
[im 2/4]
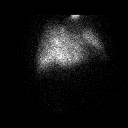
[im 2/4]
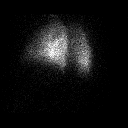
[im 3/4]
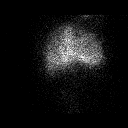
[im 3/4]
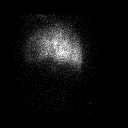
[im 4/4]
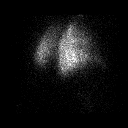
[im 4/4]
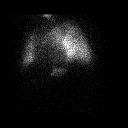

[Series 1000: lung perfusion · 1.95mm/px · 4 acquisitions, 8 frames shown]
[im 1/4]
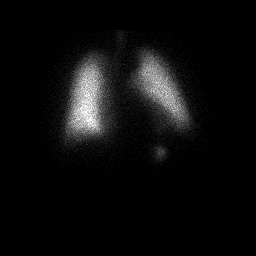
[im 1/4]
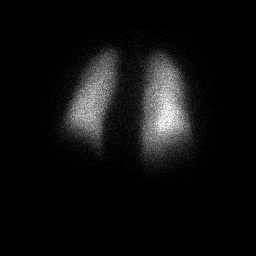
[im 2/4]
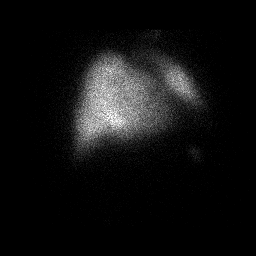
[im 2/4]
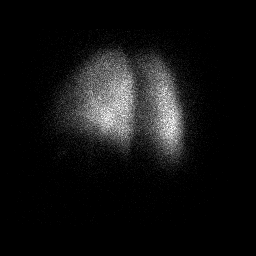
[im 3/4]
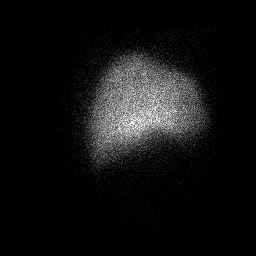
[im 3/4]
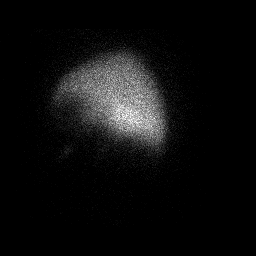
[im 4/4]
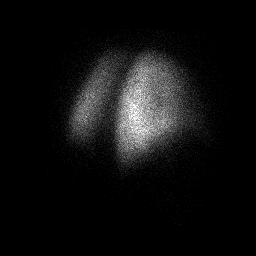
[im 4/4]
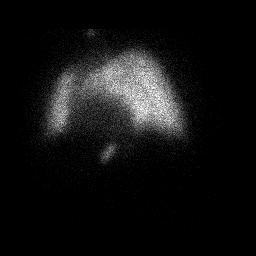

[15 of 15 positions shown; findings below may reference images not displayed]

FINDINGS: Ventilation: Radiotracer uptake is homogeneous and symmetric
bilaterally. No ventilation defects evident.

Perfusion: Radiotracer uptake is homogeneous and symmetric
bilaterally. No perfusion defects are evident.
IMPRESSION: No appreciable ventilation or perfusion defects. Very low
probability of pulmonary embolus.

## 2020-11-20 DIAGNOSIS — I5043 Acute on chronic combined systolic (congestive) and diastolic (congestive) heart failure: Secondary | ICD-10-CM | POA: Diagnosis not present

## 2020-11-20 IMAGING — CR DG CHEST 2V
2 series · 2 of 2 positions shown · non-contrast
Comparison: Radiograph 04/09/2019, CT 02/20/2019

CLINICAL DATA: Shortness of breath

EXAM:
CHEST - 2 VIEW

[chest pa]
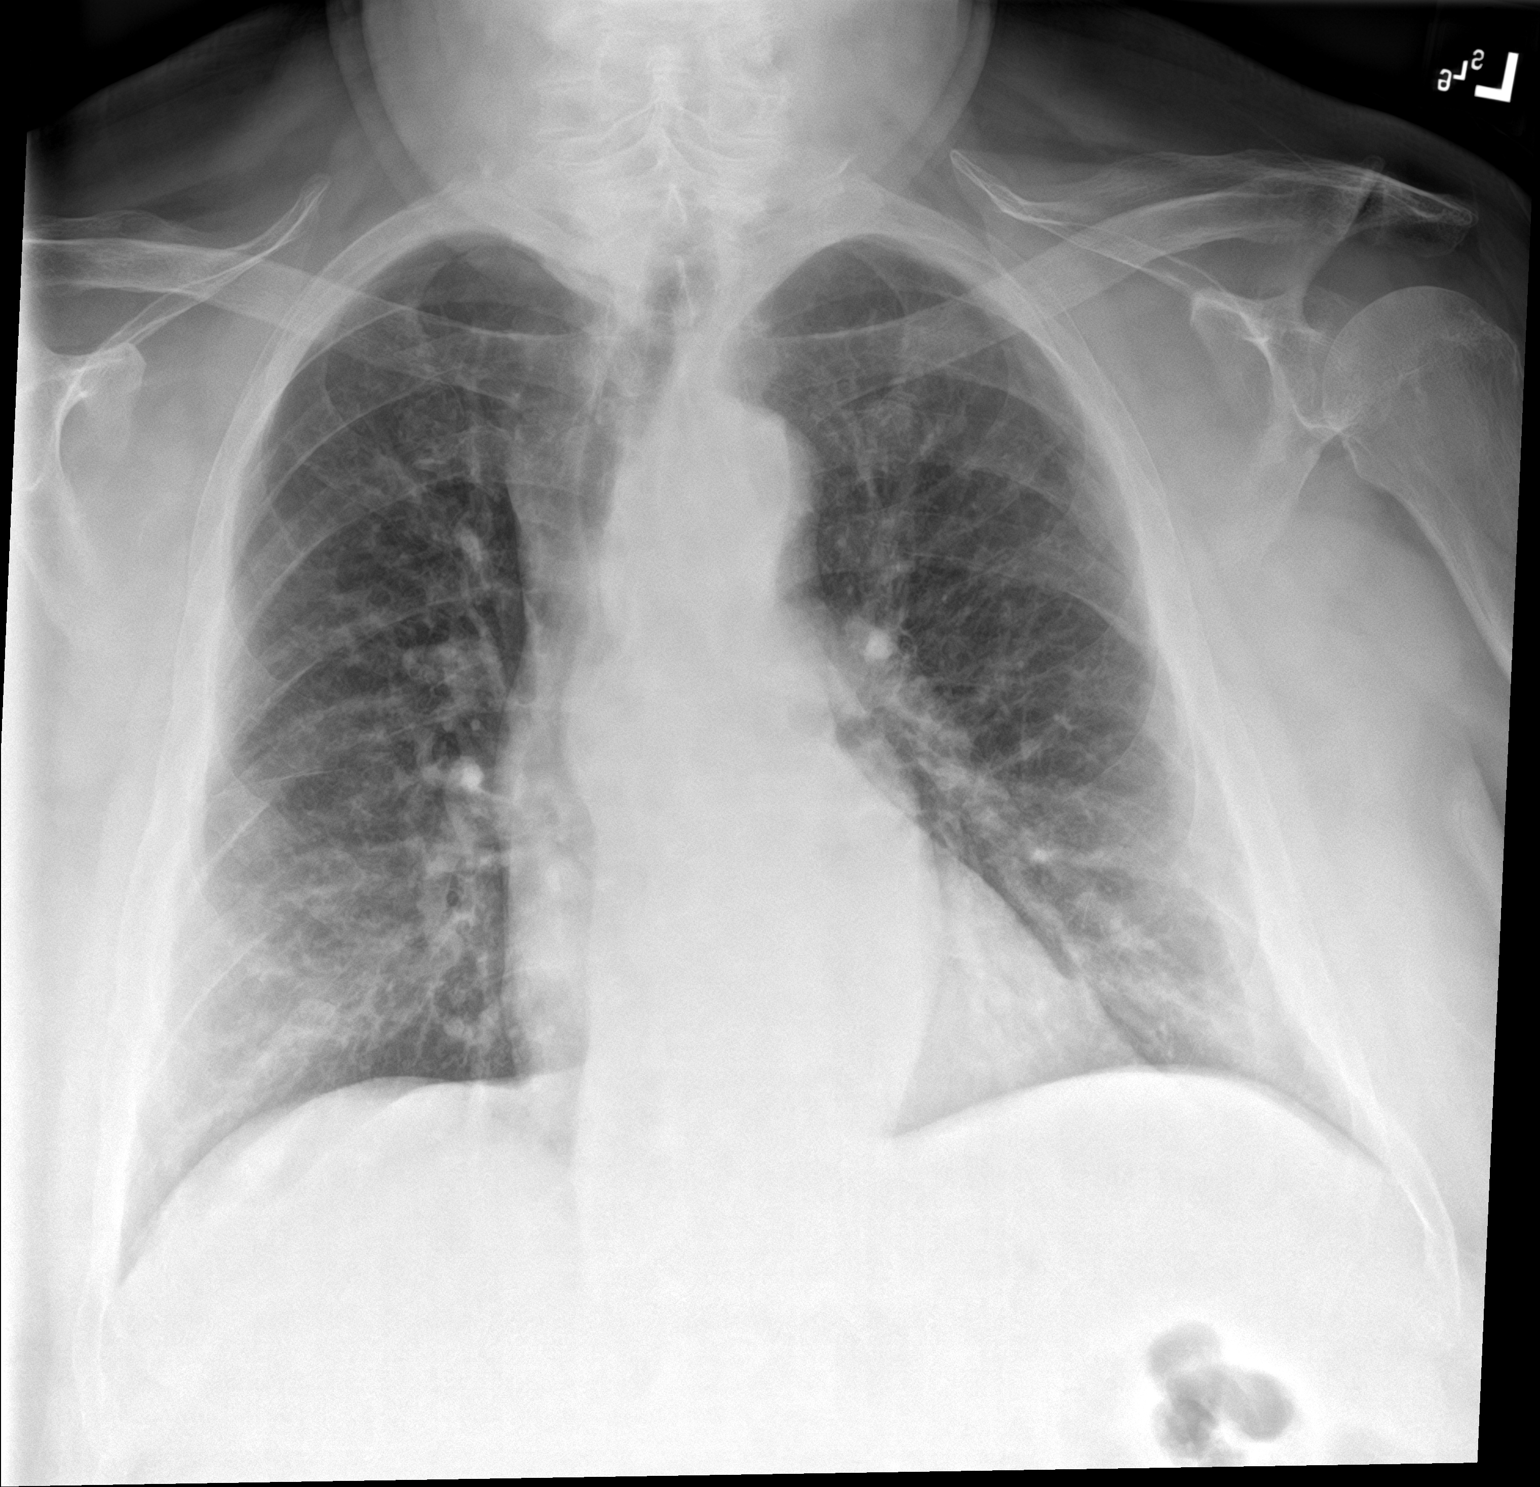

[chest lat]
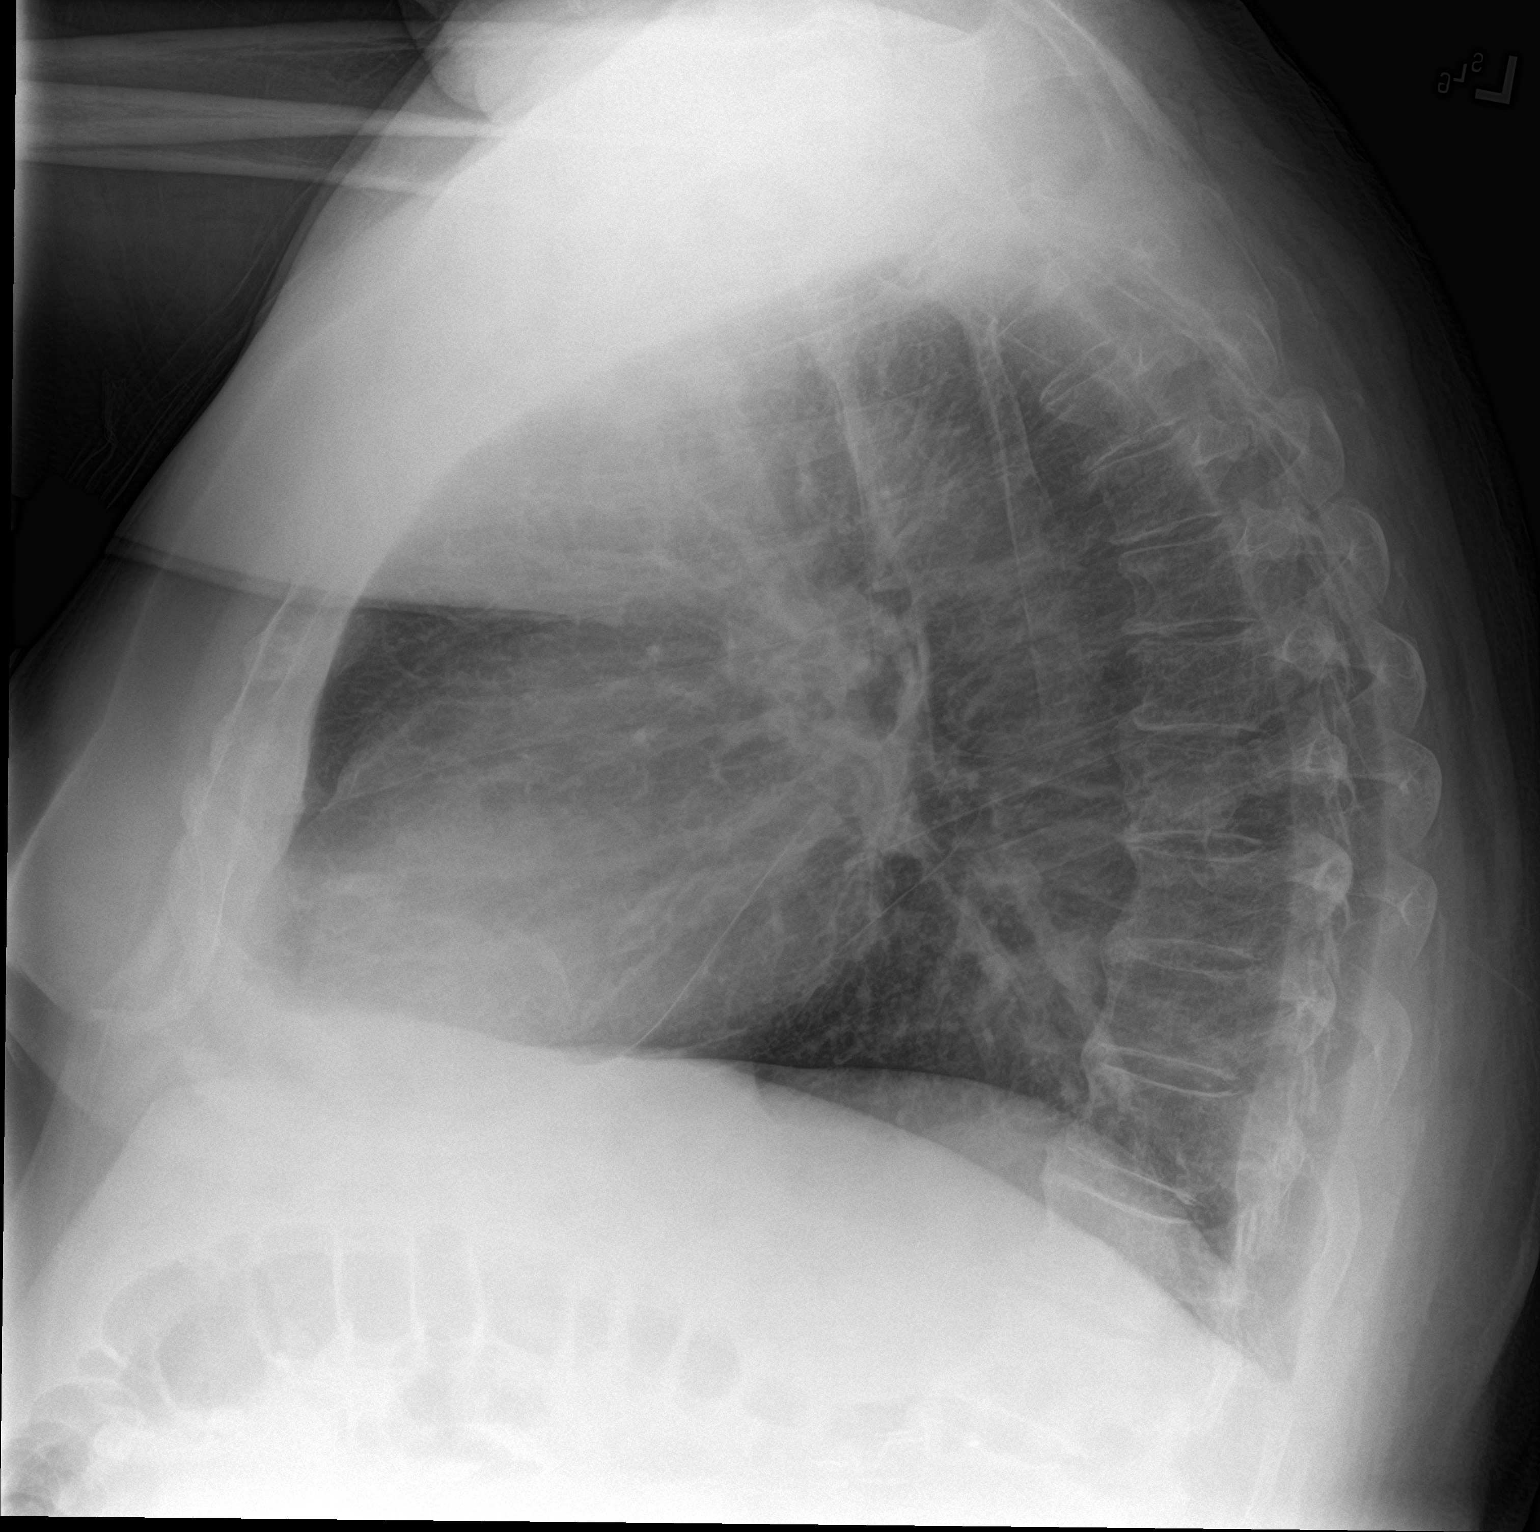

[2 of 2 positions shown; findings below may reference images not displayed]

FINDINGS: Hazy interstitial opacities with cephalized, indistinct vascularity.
Cardiomediastinal contours are stable prior. No pneumothorax or
effusion. No acute osseous or soft tissue abnormality.
IMPRESSION: Appearance compatible with CHF with mild cardiomegaly and features
of interstitial edema.

## 2020-11-21 DIAGNOSIS — I130 Hypertensive heart and chronic kidney disease with heart failure and stage 1 through stage 4 chronic kidney disease, or unspecified chronic kidney disease: Secondary | ICD-10-CM | POA: Diagnosis not present

## 2020-11-21 DIAGNOSIS — I2510 Atherosclerotic heart disease of native coronary artery without angina pectoris: Secondary | ICD-10-CM | POA: Diagnosis not present

## 2020-11-21 DIAGNOSIS — N1832 Chronic kidney disease, stage 3b: Secondary | ICD-10-CM | POA: Diagnosis not present

## 2020-11-21 DIAGNOSIS — E1122 Type 2 diabetes mellitus with diabetic chronic kidney disease: Secondary | ICD-10-CM | POA: Diagnosis not present

## 2020-11-21 DIAGNOSIS — J449 Chronic obstructive pulmonary disease, unspecified: Secondary | ICD-10-CM | POA: Diagnosis not present

## 2020-11-21 DIAGNOSIS — E1165 Type 2 diabetes mellitus with hyperglycemia: Secondary | ICD-10-CM | POA: Diagnosis not present

## 2020-11-21 DIAGNOSIS — I5042 Chronic combined systolic (congestive) and diastolic (congestive) heart failure: Secondary | ICD-10-CM | POA: Diagnosis not present

## 2020-11-21 DIAGNOSIS — D631 Anemia in chronic kidney disease: Secondary | ICD-10-CM | POA: Diagnosis not present

## 2020-11-21 DIAGNOSIS — E1142 Type 2 diabetes mellitus with diabetic polyneuropathy: Secondary | ICD-10-CM | POA: Diagnosis not present

## 2020-11-24 ENCOUNTER — Emergency Department
Admission: EM | Admit: 2020-11-24 | Discharge: 2020-11-25 | Disposition: A | Discharge status: Home / Self Care | Payer: Medicare PPO | Attending: Emergency Medicine | Admitting: Emergency Medicine

## 2020-11-24 ENCOUNTER — Other Ambulatory Visit: Payer: Self-pay

## 2020-11-24 ENCOUNTER — Encounter: Payer: Self-pay | Admitting: *Deleted

## 2020-11-24 ENCOUNTER — Emergency Department: Payer: Medicare PPO

## 2020-11-24 DIAGNOSIS — J449 Chronic obstructive pulmonary disease, unspecified: Secondary | ICD-10-CM | POA: Insufficient documentation

## 2020-11-24 DIAGNOSIS — E1122 Type 2 diabetes mellitus with diabetic chronic kidney disease: Secondary | ICD-10-CM | POA: Insufficient documentation

## 2020-11-24 DIAGNOSIS — I5033 Acute on chronic diastolic (congestive) heart failure: Secondary | ICD-10-CM | POA: Insufficient documentation

## 2020-11-24 DIAGNOSIS — M542 Cervicalgia: Secondary | ICD-10-CM | POA: Insufficient documentation

## 2020-11-24 DIAGNOSIS — Z7951 Long term (current) use of inhaled steroids: Secondary | ICD-10-CM | POA: Insufficient documentation

## 2020-11-24 DIAGNOSIS — R0902 Hypoxemia: Secondary | ICD-10-CM | POA: Diagnosis not present

## 2020-11-24 DIAGNOSIS — Z86018 Personal history of other benign neoplasm: Secondary | ICD-10-CM | POA: Diagnosis not present

## 2020-11-24 DIAGNOSIS — Z7982 Long term (current) use of aspirin: Secondary | ICD-10-CM | POA: Diagnosis not present

## 2020-11-24 DIAGNOSIS — Z7984 Long term (current) use of oral hypoglycemic drugs: Secondary | ICD-10-CM | POA: Diagnosis not present

## 2020-11-24 DIAGNOSIS — Z85528 Personal history of other malignant neoplasm of kidney: Secondary | ICD-10-CM | POA: Insufficient documentation

## 2020-11-24 DIAGNOSIS — Z8616 Personal history of COVID-19: Secondary | ICD-10-CM | POA: Diagnosis not present

## 2020-11-24 DIAGNOSIS — G4489 Other headache syndrome: Secondary | ICD-10-CM | POA: Diagnosis not present

## 2020-11-24 DIAGNOSIS — Z87891 Personal history of nicotine dependence: Secondary | ICD-10-CM | POA: Diagnosis not present

## 2020-11-24 DIAGNOSIS — R519 Headache, unspecified: Secondary | ICD-10-CM | POA: Insufficient documentation

## 2020-11-24 DIAGNOSIS — Z794 Long term (current) use of insulin: Secondary | ICD-10-CM | POA: Insufficient documentation

## 2020-11-24 DIAGNOSIS — E1142 Type 2 diabetes mellitus with diabetic polyneuropathy: Secondary | ICD-10-CM | POA: Insufficient documentation

## 2020-11-24 DIAGNOSIS — Z20822 Contact with and (suspected) exposure to covid-19: Secondary | ICD-10-CM | POA: Diagnosis not present

## 2020-11-24 DIAGNOSIS — I130 Hypertensive heart and chronic kidney disease with heart failure and stage 1 through stage 4 chronic kidney disease, or unspecified chronic kidney disease: Secondary | ICD-10-CM | POA: Diagnosis not present

## 2020-11-24 DIAGNOSIS — N1832 Chronic kidney disease, stage 3b: Secondary | ICD-10-CM | POA: Insufficient documentation

## 2020-11-24 DIAGNOSIS — R059 Cough, unspecified: Secondary | ICD-10-CM | POA: Diagnosis not present

## 2020-11-24 DIAGNOSIS — I771 Stricture of artery: Secondary | ICD-10-CM | POA: Diagnosis not present

## 2020-11-24 DIAGNOSIS — R42 Dizziness and giddiness: Secondary | ICD-10-CM | POA: Insufficient documentation

## 2020-11-24 DIAGNOSIS — I10 Essential (primary) hypertension: Secondary | ICD-10-CM | POA: Diagnosis not present

## 2020-11-24 LAB — CBG MONITORING, ED: Glucose-Capillary: 159 mg/dL — ABNORMAL HIGH (ref 70–99)

## 2020-11-24 MED ORDER — DIPHENHYDRAMINE HCL 50 MG/ML IJ SOLN
25.0000 mg | Freq: Once | INTRAMUSCULAR | Status: AC
  Administered 2020-11-25: 25 mg via INTRAVENOUS
  Filled 2020-11-24: qty 1

## 2020-11-24 MED ORDER — PROCHLORPERAZINE EDISYLATE 10 MG/2ML IJ SOLN
5.0000 mg | Freq: Once | INTRAMUSCULAR | Status: AC
  Administered 2020-11-24: 5 mg via INTRAVENOUS
  Filled 2020-11-24: qty 2

## 2020-11-24 NOTE — ED Triage Notes (Signed)
Pt brought in via ems from home with funny pains in head.  Pt has headache and neck pain.  Pt alert  speech clear.

## 2020-11-24 NOTE — ED Notes (Signed)
fsbs [id number]

## 2020-11-24 NOTE — ED Provider Notes (Signed)
[facility] Emergency Department Provider Note   ____________________________________________   Event Date/Time   First MD Initiated Contact with Patient 11/24/20 2300     (approximate)  I have reviewed the triage vital signs and the nursing notes.   HISTORY  Chief Complaint Headache    HPI Larry Callahan is a 65 y.o. male brought to the ED via EMS from home with a chief complaining of head pain and neck pain.  Patient reports approximately 8 PM onset of global head pain and bilateral neck pain.  Denies associated vision changes, slurred speech, altered mentation, extremity weakness, numbness or tingling.  Had a hospitalization for syncope 1 week ago and states he has remained dizzy.  Endorses dry cough.  Denies fever, chills, chest pain, shortness of breath, abdominal pain, nausea, vomiting or diarrhea.  Patient is very concerned about how he will get home because he does not have his keys and he lives by himself.     Past Medical History:  Diagnosis Date   Allergy    COPD (chronic obstructive pulmonary disease) ([place])    Coronary artery disease    Diabetes mellitus without complication (HCC)    Diastolic CHF ([place])    GERD (gastroesophageal reflux disease)    Hyperlipidemia    Hypertension    Renal insufficiency    Sleep apnea     Patient Active Problem List   Diagnosis Date Noted   Syncope and collapse 11/16/2020   Demand ischemia ([place])    Influenza A 10/30/2020   Chest pain 10/06/2020   HLD (hyperlipidemia) 10/06/2020   History of CVA (cerebrovascular accident) 09/25/2020   Gastroesophageal reflux disease without esophagitis 04/19/2020   Pneumonia due to COVID-19 virus 01/30/2020   Elevated troponin I level 01/29/2020   Suspected COVID-19 virus infection 01/29/2020   Postural dizziness with presyncope 01/29/2020   Hematemesis 01/09/2020   Hospital discharge follow-up 11/26/2019   Cellulitis of left hand 09/26/2019   Generalized  weakness 08/08/2019   Obesity (BMI 30-39.9) 08/08/2019   Acute right hip pain 06/17/2019   Inability to ambulate due to hip 06/17/2019   CKD stage 3 due to type 2 diabetes mellitus (HCC)    Hypervolemia    Acute on chronic diastolic CHF (congestive heart failure) ([place])    Type 2 diabetes mellitus with stage 3b chronic kidney disease, with long-term current use of insulin ([place])    Hypomagnesemia    Full code status 05/20/2019   Pleuritic chest pain    Multifocal pneumonia 05/19/2019   Acute kidney injury superimposed on CKD 3b ([place]) 05/09/2019   Hemoptysis 04/09/2019   Shortness of breath 03/26/2019   Uncontrolled type 2 diabetes mellitus with insulin therapy ([place]) 03/12/2019   Acute on chronic heart failure with preserved ejection fraction (HFpEF) (HCC)    Chronic kidney disease, stage 3b (HCC)    Chronic systolic CHF (congestive heart failure) ([place]) 77/93/9030   Diastolic dysfunction 03/05/3006   Iron deficiency anemia 12/31/2018   Atopic dermatitis 11/24/2018   Screening for colon cancer 11/06/2017   Uncontrolled type 2 diabetes mellitus with hyperglycemia ([place]) 11/06/2017   Hidradenitis 06/09/2017   Atherosclerotic heart disease of native coronary artery without angina pectoris 06/09/2017   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   Obstructive sleep apnea, adult 06/09/2017   Morbid (severe) obesity due to excess calories ([place]) 06/09/2017   Cigarette nicotine dependence with nicotine-induced disorder 06/09/2017   Diabetic polyneuropathy associated with type 2 diabetes mellitus ([place]) 06/09/2017  Allergic rhinitis due to pollen 06/09/2017   Mixed hyperlipidemia 06/09/2017   COPD (chronic obstructive pulmonary disease) ([place]) 06/09/2017   Dyspnea on exertion 06/09/2017   Wheezing 06/09/2017   Dysuria 06/09/2017   Snoring 06/09/2017   Essential hypertension 06/09/2017   Personal history of other malignant neoplasm of kidney 06/09/2017   Pain in  right hip 06/09/2017   Impacted cerumen, bilateral 06/09/2017   Secondary hyperparathyroidism, not elsewhere classified ([place]) 06/09/2017   Type 2 diabetes mellitus with hyperglycemia ([place]) 06/09/2017   Tinea corporis 06/09/2017    Past Surgical History:  Procedure Laterality Date   BACK SURGERY     CARDIAC CATHETERIZATION     CHOLECYSTECTOMY     COLONOSCOPY WITH PROPOFOL N/A 04/12/2019   Procedure: COLONOSCOPY WITH PROPOFOL;  Surgeon: [name], MD;  Location: [name] ENDOSCOPY;  Service: Gastroenterology;  Laterality: N/A;   ESOPHAGOGASTRODUODENOSCOPY N/A 04/12/2019   Procedure: ESOPHAGOGASTRODUODENOSCOPY (EGD);  Surgeon: [name], MD;  Location: [facility] ENDOSCOPY;  Service: Gastroenterology;  Laterality: N/A;   ESOPHAGOGASTRODUODENOSCOPY (EGD) WITH PROPOFOL N/A 11/21/2019   Procedure: ESOPHAGOGASTRODUODENOSCOPY (EGD) WITH PROPOFOL;  Surgeon: [name], MD;  Location: [facility] ENDOSCOPY;  Service: Gastroenterology;  Laterality: N/A;   FLEXIBLE BRONCHOSCOPY Bilateral 05/17/2019   Procedure: FLEXIBLE BRONCHOSCOPY;  Surgeon: [name], MD;  Location: [name] ORS;  Service: Pulmonary;  Laterality: Bilateral;   left arm surgery     nephrectomy Left    PARATHYROIDECTOMY     RIGHT HEART CATH N/A 03/29/2019   Procedure: RIGHT HEART CATH;  Surgeon: [name], MD;  Location: [place] CV LAB;  Service: Cardiovascular;  Laterality: N/A;    Prior to Admission medications   Medication Sig Start Date End Date Taking? Authorizing Provider  meclizine (ANTIVERT) 25 MG tablet Take 1 tablet (25 mg total) by mouth 3 (three) times daily as needed for dizziness or nausea. 11/25/20  Yes Paulette Blanch, MD  albuterol (VENTOLIN HFA) 108 (90 Base) MCG/ACT inhaler Inhale 2 puffs into the lungs every 6 (six) hours as needed for wheezing or shortness of breath. 08/31/20   Lavera Guise, MD  aspirin EC 81 MG EC tablet Take 1 tablet (81 mg total) by mouth daily. Swallow whole. 11/01/20    Lorella Nimrod, MD  Blood Glucose Calibration (TRUE METRIX LEVEL 1) Low SOLN Use as directed 04/04/18   [name], NP  Blood Glucose Monitoring Suppl (TRUE METRIX AIR GLUCOSE METER) DEVI 1 Device by Does not apply route 2 (two) times daily. 04/13/18   [name], NP  budesonide-formoterol (SYMBICORT) 160-4.5 MCG/ACT inhaler Inhale 2 puffs into the lungs 2 (two) times daily. 07/20/20   Lavera Guise, MD  carvedilol (COREG) 6.25 MG tablet Take 1 tablet (6.25 mg total) by mouth 2 (two) times daily with a meal. 10/08/20   Jennye Boroughs, MD  cetirizine (ZYRTEC) 10 MG tablet Take 10 mg by mouth daily.    [provider]  escitalopram (LEXAPRO) 10 MG tablet Take one tab po qd for anxiety with food Patient taking differently: Take 10 mg by mouth daily. Take one tab po qd for anxiety with food 07/17/20   Lavera Guise, MD  ferrous gluconate (FERGON) 324 MG tablet Take 1 tablet (324 mg total) by mouth daily with breakfast. 08/01/19   [name], NP  furosemide (LASIX) 40 MG tablet Take one to tabs once or twice days for fluid // swelling 07/17/20   Lavera Guise, MD  gabapentin (NEURONTIN) 100 MG  capsule Take 1 capsule (100 mg total) by mouth in the morning, at noon, and at bedtime. 07/17/20   Lavera Guise, MD  glimepiride Jari Sportsman) 2 MG tablet Take one tab po qd with supper 07/17/20   Lavera Guise, MD  glucose blood (TRUE METRIX BLOOD GLUCOSE TEST) test strip Use as instructed twice daily diag E11.65 07/30/19   [name], NP  hydrALAZINE (APRESOLINE) 100 MG tablet Take 1 tablet (100 mg total) by mouth 3 (three) times daily. 07/17/20   Lavera Guise, MD  insulin glargine, 1 Unit Dial, (TOUJEO) 300 UNIT/ML Solostar Pen Inject 26 Units into the skin daily. 04/30/20   [name], NP  ipratropium-albuterol (DUONEB) 0.5-2.5 (3) MG/3ML SOLN Take 3 mLs by nebulization every 6 (six) hours as needed. 10/11/20   Lavera Guise, MD  isosorbide mononitrate (IMDUR) 30 MG 24 hr tablet Take 1  tablet (30 mg total) by mouth 2 (two) times daily. 10/08/20   Jennye Boroughs, MD  mupirocin ointment (BACTROBAN) 2 % Apply 1 application topically daily. Qd to excision site Patient not taking: Reported on 11/16/2020 10/06/20   Ralene Bathe, MD  NOVOFINE PEN NEEDLE 32G X 6 MM MISC Use as directed 10/15/20   Lavera Guise, MD  omeprazole (PRILOSEC) 40 MG capsule Take 1 capsule (40 mg total) by mouth in the morning and at bedtime. 07/17/20   Lavera Guise, MD  potassium chloride SA (KLOR-CON) 20 MEQ tablet Take 1 tablet (20 mEq total) by mouth 2 (two) times daily. 07/17/20   Lavera Guise, MD  tiotropium (SPIRIVA HANDIHALER) 18 MCG inhalation capsule Place 1 capsule (18 mcg total) into inhaler and inhale daily. 11/10/20 02/08/21  Lavera Guise, MD  TRUEplus Lancets 33G MISC Use as directed twice daily diag E11.65 07/30/19   [name], NP    Allergies Penicillins  Family History  Problem Relation Age of Onset   Diabetes Mother        2003   Lung cancer Father    Diabetes Sister    Hypertension Sister    Heart disease Sister    Cancer Sister    Bone cancer Brother     Social History Social History   Tobacco Use   Smoking status: Former    Pack years: 0.00    Types: Cigarettes   Smokeless tobacco: Former    Types: Nurse, children's Use: Never used  Substance Use Topics   Alcohol use: No   Drug use: No    Review of Systems  Constitutional: No fever/chills Eyes: No visual changes. ENT: No sore throat. Cardiovascular: Denies chest pain. Respiratory: Denies shortness of breath. Gastrointestinal: No abdominal pain.  No nausea, no vomiting.  No diarrhea.  No constipation. Genitourinary: Negative for dysuria. Musculoskeletal: Negative for back pain. Skin: Negative for rash. Neurological: Positive for headache. Negative for focal weakness or numbness.   ____________________________________________   PHYSICAL EXAM:  VITAL SIGNS: ED Triage Vitals  Enc Vitals  Group     BP 11/24/20 2120 (!) 158/83     Pulse Rate 11/24/20 2120 (!) 120     Resp 11/24/20 2120 (!) 22     Temp 11/24/20 2120 99.2 F (37.3 C)     Temp Source 11/24/20 2120 Oral     SpO2 11/24/20 2120 94 %     Weight 11/24/20 2125 277 lb (125.6 kg)     Height 11/24/20 2125 _0  (1.803 m)  Head Circumference --      Peak Flow --      Pain Score 11/24/20 2125 10     Pain Loc --      Pain Edu? --      Excl. in [place]? --     Constitutional: Alert and oriented. Well appearing and in no acute distress. Eyes: Conjunctivae are normal. PERRL. EOMI. Head: Atraumatic. Nose: No congestion/rhinnorhea. Mouth/Throat: Mucous membranes are moist.   Neck: No stridor.  No carotid bruits.  Supple neck without meningismus. Cardiovascular: Normal rate, regular rhythm. Grossly normal heart sounds.  Good peripheral circulation. Respiratory: Normal respiratory effort.  No retractions. Lungs slightly diminished bilaterally. Gastrointestinal: Soft and nontender to light or deep palpation. No distention. No abdominal bruits. No CVA tenderness. Musculoskeletal: No lower extremity tenderness nor edema.  No joint effusions. Neurologic: Alert and oriented x3.  CN II-XII grossly intact. Normal speech and language. No gross focal neurologic deficits are appreciated. No gait instability. Skin:  Skin is warm, dry and intact. No rash noted.  No petechiae. Psychiatric: Mood and affect are normal. Speech and behavior are normal.  ____________________________________________   LABS (all labs ordered are listed, but only abnormal results are displayed)  Labs Reviewed  CBC WITH DIFFERENTIAL/PLATELET - Abnormal; Notable for the following components:      Result Value   RBC 3.55 (*)    Hemoglobin 9.8 (*)    HCT 31.2 (*)    All other components within normal limits  COMPREHENSIVE METABOLIC PANEL - Abnormal; Notable for the following components:   CO2 20 (*)    Glucose, Bld 165 (*)    BUN 44 (*)    Creatinine,  Ser 2.35 (*)    Calcium 8.7 (*)    Albumin 2.8 (*)    GFR, Estimated 30 (*)    All other components within normal limits  URINALYSIS, COMPLETE (UACMP) WITH MICROSCOPIC - Abnormal; Notable for the following components:   Color, Urine YELLOW (*)    APPearance CLEAR (*)    Glucose, UA 50 (*)    Protein, ur >=300 (*)    All other components within normal limits  SEDIMENTATION RATE - Abnormal; Notable for the following components:   Sed Rate 91 (*)    All other components within normal limits  CBG MONITORING, ED - Abnormal; Notable for the following components:   Glucose-Capillary 159 (*)    All other components within normal limits  RESP PANEL BY RT-PCR (FLU A&B, COVID) ARPGX2  CULTURE, BLOOD (ROUTINE X 2)  CULTURE, BLOOD (ROUTINE X 2)  URINE CULTURE  LACTIC ACID, PLASMA  C-REACTIVE PROTEIN  TROPONIN I (HIGH SENSITIVITY)  TROPONIN I (HIGH SENSITIVITY)   ____________________________________________  EKG  ED ECG REPORT I, Arielle Eber J, the attending physician, personally viewed and interpreted this ECG.   Date: 11/24/2020  EKG Time: 2346  Rate: 78  Rhythm: normal EKG, normal sinus rhythm  Axis: Normal  Intervals:none  ST&T Change: Nonspecific  ____________________________________________  RADIOLOGY I, Salina Stanfield J, personally viewed and evaluated these images (plain radiographs) as part of my medical decision making, as well as reviewing the written report by the radiologist.  ED MD interpretation: No ICH, no acute cardiopulmonary process; unremarkable MRI brain and neck but consider routine follow-up CTA of neck to achieve better vessel detail  Official radiology report(s): DG Chest 2 View  Result Date: 11/24/2020 CLINICAL DATA:  Cough, headache, and neck pain. EXAM: CHEST - 2 VIEW COMPARISON:  11/16/2020 FINDINGS: The heart size and mediastinal contours  are within normal limits. Both lungs are clear. The visualized skeletal structures are unremarkable. IMPRESSION: No  active cardiopulmonary disease. Electronically Signed   By: Lucienne Capers M.D.   On: 11/24/2020 23:46   CT Head Wo Contrast  Result Date: 11/24/2020 CLINICAL DATA:  Dizziness EXAM: CT HEAD WITHOUT CONTRAST TECHNIQUE: Contiguous axial images were obtained from the base of the skull through the vertex without intravenous contrast. COMPARISON:  CT brain 11/16/2020 FINDINGS: Brain: No evidence of acute infarction, hemorrhage, hydrocephalus, extra-axial collection or mass lesion/mass effect. Vascular: No hyperdense vessels.  Carotid vascular calcification Skull: Normal. Negative for fracture or focal lesion. Sinuses/Orbits: No acute finding. Other: None IMPRESSION: Negative non contrasted CT appearance of the brain Electronically Signed   By: Donavan Foil M.D.   On: 11/24/2020 23:45   MR Angiogram Neck W or Wo Contrast  Result Date: 11/25/2020 CLINICAL DATA:  65 year old male with dizziness, headache and neck pain. Woke with sharp pains in the head and neck. No known injury. EXAM: MRA NECK WITHOUT AND WITH CONTRAST TECHNIQUE: Multiplanar and multiecho pulse sequences of the neck were obtained without and with intravenous contrast. Angiographic images of the neck were obtained using MRA technique without and with intravenous contrast. CONTRAST:  80m GADAVIST GADOBUTROL 1 MMOL/ML IV SOLN in conjunction with contrast enhanced imaging of the brain reported separately. COMPARISON:  Brain MRI today. Brain MRI and intracranial MRA 09/26/2020. FINDINGS: 3D time-of-flight images suffer from wrap artifact, but demonstrate antegrade flow signal in the bilateral cervical carotids and vertebral arteries. Initially early neck MRA contrast timing (pulmonary arteries opacified on series 13). And the later series 17 source images are degraded by motion and venous contamination. Thin axial MIP images were created of the most successful source images (series 16 and 17). On these both common carotid arteries and carotid  bifurcations appear patent without stenosis. Both cervical ICAs are patent to the skull base, that on the right appears tortuous in the upper neck similar to the intracranial MRA source images in April. And the left ICA is tortuous just distal to the bulb. No ICA stenosis is evident. Both cervical vertebral arteries are patent to the skull base. The right appears to be dominant and has a late entry into the cervical transverse foramen. There is no obvious cervical vertebral artery stenosis. IMPRESSION: 1. Suboptimal due to contrast bolus timing and artifact. 2. Cervical ICAs and vertebral arteries are patent to the skull base. - tortuous cervical ICAs with no stenosis identified. - dominant right vertebral artery, with no obvious vertebral artery stenosis. 3. Consider a routine follow-up CTA of the Neck to achieve better vessel detail. Electronically Signed   By: HGenevie AnnM.D.   On: 11/25/2020 04:35   MR Brain W and Wo Contrast  Result Date: 11/25/2020 CLINICAL DATA:  65year old male with dizziness, headache and neck pain. Woke with sharp pains in the head and neck. No known injury. EXAM: MRI HEAD WITHOUT AND WITH CONTRAST TECHNIQUE: Multiplanar, multiecho pulse sequences of the brain and surrounding structures were obtained without and with intravenous contrast. CONTRAST:  138mGADAVIST GADOBUTROL 1 MMOL/ML IV SOLN COMPARISON:  Head CT without contrast 11/24/2020. Brain MRI and intracranial MRA 09/26/2020. FINDINGS: Study is mildly degraded by motion artifact despite repeated imaging attempts today. Brain: No restricted diffusion to suggest acute infarction. No midline shift, mass effect, evidence of mass lesion, ventriculomegaly, extra-axial collection or acute intracranial hemorrhage. Cervicomedullary junction and pituitary are within normal limits. GrPearline Cablesnd white matter signal appears stable throughout  the brain since April and within normal limits for age. There are occasional tiny nonspecific cerebral  white matter T2 and FLAIR hyperintense foci (such as series 16, image 27). Susceptibility focus in the right parietal lobe on series 14, image 40 SWI is stable, and on the prior study more resemble the vessel than a chronic microhemorrhage. Deep gray matter nuclei, brainstem and cerebellum remain within normal limits. No abnormal enhancement identified.  No dural thickening. Vascular: Major intracranial vascular flow voids are stable since April with dominant distal right vertebral artery and mild mass effect from tortuosity on the right cervicomedullary junction. The major dural venous sinuses are enhancing and appear to be patent. Skull and upper cervical spine: Negative. Visualized bone marrow signal is within normal limits. Sinuses/Orbits: Stable and negative; trace paranasal sinus mucosal thickening. Other: Mild bilateral mastoid effusions have increased since April. Negative visible nasopharynx. Otherwise grossly normal visible internal auditory structures. Unremarkable stylomastoid foramina. Anterior maxillary dental periapical or hard palate fissural cysts incidentally noted and stable. IMPRESSION: 1. No acute intracranial abnormality, with MRI appearance of the brain stable since April and largely normal for age. 2. Bilateral mastoid effusions have developed since April, and are likely postinflammatory such as from interval URI or otitis media. Other visible internal auditory structures appear normal. Electronically Signed   By: Genevie Ann M.D.   On: 11/25/2020 04:11    ____________________________________________   PROCEDURES  Procedure(s) performed (including Critical Care):  Procedures   ____________________________________________   INITIAL IMPRESSION / ASSESSMENT AND PLAN / ED COURSE  As part of my medical decision making, I reviewed the following data within the [place] notes reviewed and incorporated, Labs reviewed, EKG interpreted, Old chart reviewed,  Radiograph reviewed, and Notes from prior ED visits     65 year old male presenting with headache and neck pain, dizziness since hospitalization 1 week ago. Differential diagnosis includes, but is not limited to, intracranial hemorrhage, meningitis/encephalitis, previous head trauma, cavernous venous thrombosis, tension headache, temporal arteritis, migraine or migraine equivalent, idiopathic intracranial hypertension, and non-specific headache.   Patient has no focal neurological deficits on exam.  Will obtain lab work, CT head, administer IV Compazine for headache.  Considered CTA head/neck but patient's most recent GFR would not CTA. will consider MRI depending on what patient's lab work shows tonight.  Clinical Course as of 11/25/20 5366  Tue Nov 24, 2020  2322 Repeat oral temperature 98.2 F. [JS]  Wed Nov 25, 2020  0101 Called MRI tech; there is no GFR cut off to obtain MRI with gadolinium.  Awaken patient from sleep to update him of results thus far. [JS]  0505 Updated patient of MRI results.  Will perform ambulation trial to see how steady patient is on his feet. [JS]  0600 Patient awoke; updated him of MRI results.  Complains of mild dizziness.  He was able to ambulate to the restroom with minimal assistance.  States he has home care provider at home.  Ambulates with cane.  Will administer meclizine.  Patient states he should be able to get into his house after daylight. [JS]    Clinical Course User Index [JS] Paulette Blanch, MD     ____________________________________________   FINAL CLINICAL IMPRESSION(S) / ED DIAGNOSES  Final diagnoses:  Acute nonintractable headache, unspecified headache type  Dizziness     ED Discharge Orders          Ordered    meclizine (ANTIVERT) 25 MG tablet  3 times daily PRN  11/25/20 0604             Note:  This document was prepared using Dragon voice recognition software and may include unintentional dictation errors.    Paulette Blanch, MD 11/25/20 [phone]

## 2020-11-25 ENCOUNTER — Emergency Department: Payer: Medicare PPO

## 2020-11-25 DIAGNOSIS — R519 Headache, unspecified: Secondary | ICD-10-CM | POA: Diagnosis not present

## 2020-11-25 DIAGNOSIS — M542 Cervicalgia: Secondary | ICD-10-CM | POA: Diagnosis not present

## 2020-11-25 DIAGNOSIS — I771 Stricture of artery: Secondary | ICD-10-CM | POA: Diagnosis not present

## 2020-11-25 DIAGNOSIS — R42 Dizziness and giddiness: Secondary | ICD-10-CM | POA: Diagnosis not present

## 2020-11-25 LAB — COMPREHENSIVE METABOLIC PANEL
ALT: 22 U/L (ref 0–44)
AST: 20 U/L (ref 15–41)
Albumin: 2.8 g/dL — ABNORMAL LOW (ref 3.5–5.0)
Alkaline Phosphatase: 71 U/L (ref 38–126)
Anion gap: 10 (ref 5–15)
BUN: 44 mg/dL — ABNORMAL HIGH (ref 8–23)
CO2: 20 mmol/L — ABNORMAL LOW (ref 22–32)
Calcium: 8.7 mg/dL — ABNORMAL LOW (ref 8.9–10.3)
Chloride: 107 mmol/L (ref 98–111)
Creatinine, Ser: 2.35 mg/dL — ABNORMAL HIGH (ref 0.61–1.24)
GFR, Estimated: 30 mL/min — ABNORMAL LOW (ref >60)
Glucose, Bld: 165 mg/dL — ABNORMAL HIGH (ref 70–99)
Potassium: 4.3 mmol/L (ref 3.5–5.1)
Sodium: 137 mmol/L (ref 135–145)
Total Bilirubin: 0.6 mg/dL (ref 0.3–1.2)
Total Protein: 6.8 g/dL (ref 6.5–8.1)

## 2020-11-25 LAB — URINALYSIS, COMPLETE (UACMP) WITH MICROSCOPIC
Bacteria, UA: NONE SEEN
Bilirubin Urine: NEGATIVE
Glucose, UA: 50 mg/dL — AB
Hgb urine dipstick: NEGATIVE
Ketones, ur: NEGATIVE mg/dL
Leukocytes,Ua: NEGATIVE
Nitrite: NEGATIVE
Protein, ur: >=300 mg/dL — AB
Specific Gravity, Urine: 1.011 (ref 1.005–1.030)
pH: 5 (ref 5.0–8.0)

## 2020-11-25 LAB — CBC WITH DIFFERENTIAL/PLATELET
Abs Immature Granulocytes: 0.04 10*3/uL (ref 0.00–0.07)
Basophils Absolute: 0.1 10*3/uL (ref 0.0–0.1)
Basophils Relative: 1 %
Eosinophils Absolute: 0.2 10*3/uL (ref 0.0–0.5)
Eosinophils Relative: 2 %
HCT: 31.2 % — ABNORMAL LOW (ref 39.0–52.0)
Hemoglobin: 9.8 g/dL — ABNORMAL LOW (ref 13.0–17.0)
Immature Granulocytes: 1 %
Lymphocytes Relative: 21 %
Lymphs Abs: 1.4 10*3/uL (ref 0.7–4.0)
MCH: 27.6 pg (ref 26.0–34.0)
MCHC: 31.4 g/dL (ref 30.0–36.0)
MCV: 87.9 fL (ref 80.0–100.0)
Monocytes Absolute: 0.4 10*3/uL (ref 0.1–1.0)
Monocytes Relative: 7 %
Neutro Abs: 4.5 10*3/uL (ref 1.7–7.7)
Neutrophils Relative %: 68 %
Platelets: 196 10*3/uL (ref 150–400)
RBC: 3.55 MIL/uL — ABNORMAL LOW (ref 4.22–5.81)
RDW: 13.3 % (ref 11.5–15.5)
WBC: 6.6 10*3/uL (ref 4.0–10.5)
nRBC: 0 % (ref 0.0–0.2)

## 2020-11-25 LAB — LACTIC ACID, PLASMA: Lactic Acid, Venous: 0.8 mmol/L (ref 0.5–1.9)

## 2020-11-25 LAB — RESP PANEL BY RT-PCR (FLU A&B, COVID) ARPGX2
Influenza A by PCR: NEGATIVE
Influenza B by PCR: NEGATIVE
SARS Coronavirus 2 by RT PCR: NEGATIVE

## 2020-11-25 LAB — TROPONIN I (HIGH SENSITIVITY)
Troponin I (High Sensitivity): 15 ng/L (ref <18)
Troponin I (High Sensitivity): 17 ng/L (ref <18)

## 2020-11-25 LAB — SEDIMENTATION RATE: Sed Rate: 91 mm/hr — ABNORMAL HIGH (ref 0–20)

## 2020-11-25 MED ORDER — GADOBUTROL 1 MMOL/ML IV SOLN
10.0000 mL | Freq: Once | INTRAVENOUS | Status: AC | PRN
  Administered 2020-11-25: 10 mL via INTRAVENOUS

## 2020-11-25 MED ORDER — MECLIZINE HCL 25 MG PO TABS
25.0000 mg | ORAL_TABLET | Freq: Once | ORAL | Status: AC
  Administered 2020-11-25: 25 mg via ORAL
  Filled 2020-11-25: qty 1

## 2020-11-25 MED ORDER — MECLIZINE HCL 25 MG PO TABS: 25.0000 mg | ORAL_TABLET | Freq: Three times a day (TID) | ORAL | 0 refills | Status: DC | PRN

## 2020-11-25 NOTE — Discharge Instructions (Signed)
You may take Meclizine as needed for dizziness.  Return to the ER for worsening symptoms, persistent vomiting, difficulty breathing or other concerns.

## 2020-11-26 LAB — URINE CULTURE: Culture: NO GROWTH

## 2020-11-30 ENCOUNTER — Emergency Department
Admission: EM | Admit: 2020-11-30 | Discharge: 2020-11-30 | Disposition: A | Discharge status: Home / Self Care | Payer: Medicare PPO | Attending: Emergency Medicine | Admitting: Emergency Medicine

## 2020-11-30 ENCOUNTER — Encounter: Payer: Self-pay | Admitting: Nurse Practitioner

## 2020-11-30 ENCOUNTER — Other Ambulatory Visit: Payer: Self-pay

## 2020-11-30 ENCOUNTER — Emergency Department: Payer: Medicare PPO

## 2020-11-30 ENCOUNTER — Ambulatory Visit (INDEPENDENT_AMBULATORY_CARE_PROVIDER_SITE_OTHER): Payer: Medicare PPO | Admitting: Internal Medicine

## 2020-11-30 ENCOUNTER — Encounter: Payer: Self-pay | Admitting: Emergency Medicine

## 2020-11-30 VITALS — BP 157/92 | HR 71 | Temp 98.1°F | Resp 16 | Ht 71.0 in | Wt 276.4 lb

## 2020-11-30 DIAGNOSIS — Z7982 Long term (current) use of aspirin: Secondary | ICD-10-CM | POA: Diagnosis not present

## 2020-11-30 DIAGNOSIS — K429 Umbilical hernia without obstruction or gangrene: Secondary | ICD-10-CM | POA: Diagnosis not present

## 2020-11-30 DIAGNOSIS — J449 Chronic obstructive pulmonary disease, unspecified: Secondary | ICD-10-CM | POA: Insufficient documentation

## 2020-11-30 DIAGNOSIS — N2889 Other specified disorders of kidney and ureter: Secondary | ICD-10-CM | POA: Diagnosis not present

## 2020-11-30 DIAGNOSIS — E1142 Type 2 diabetes mellitus with diabetic polyneuropathy: Secondary | ICD-10-CM | POA: Diagnosis not present

## 2020-11-30 DIAGNOSIS — I5033 Acute on chronic diastolic (congestive) heart failure: Secondary | ICD-10-CM | POA: Insufficient documentation

## 2020-11-30 DIAGNOSIS — I2510 Atherosclerotic heart disease of native coronary artery without angina pectoris: Secondary | ICD-10-CM | POA: Diagnosis not present

## 2020-11-30 DIAGNOSIS — Z7984 Long term (current) use of oral hypoglycemic drugs: Secondary | ICD-10-CM | POA: Insufficient documentation

## 2020-11-30 DIAGNOSIS — R102 Pelvic and perineal pain: Secondary | ICD-10-CM | POA: Diagnosis not present

## 2020-11-30 DIAGNOSIS — D509 Iron deficiency anemia, unspecified: Secondary | ICD-10-CM | POA: Diagnosis not present

## 2020-11-30 DIAGNOSIS — Z7951 Long term (current) use of inhaled steroids: Secondary | ICD-10-CM | POA: Insufficient documentation

## 2020-11-30 DIAGNOSIS — E279 Disorder of adrenal gland, unspecified: Secondary | ICD-10-CM | POA: Diagnosis not present

## 2020-11-30 DIAGNOSIS — R042 Hemoptysis: Secondary | ICD-10-CM

## 2020-11-30 DIAGNOSIS — N1832 Chronic kidney disease, stage 3b: Secondary | ICD-10-CM | POA: Diagnosis not present

## 2020-11-30 DIAGNOSIS — Z79899 Other long term (current) drug therapy: Secondary | ICD-10-CM | POA: Diagnosis not present

## 2020-11-30 DIAGNOSIS — R109 Unspecified abdominal pain: Secondary | ICD-10-CM | POA: Diagnosis not present

## 2020-11-30 DIAGNOSIS — Z8616 Personal history of COVID-19: Secondary | ICD-10-CM | POA: Insufficient documentation

## 2020-11-30 DIAGNOSIS — Z87891 Personal history of nicotine dependence: Secondary | ICD-10-CM | POA: Insufficient documentation

## 2020-11-30 DIAGNOSIS — I130 Hypertensive heart and chronic kidney disease with heart failure and stage 1 through stage 4 chronic kidney disease, or unspecified chronic kidney disease: Secondary | ICD-10-CM | POA: Diagnosis not present

## 2020-11-30 DIAGNOSIS — Z794 Long term (current) use of insulin: Secondary | ICD-10-CM | POA: Diagnosis not present

## 2020-11-30 DIAGNOSIS — E1122 Type 2 diabetes mellitus with diabetic chronic kidney disease: Secondary | ICD-10-CM | POA: Insufficient documentation

## 2020-11-30 LAB — URINALYSIS, COMPLETE (UACMP) WITH MICROSCOPIC
Bacteria, UA: NONE SEEN
Bilirubin Urine: NEGATIVE
Glucose, UA: 50 mg/dL — AB
Hgb urine dipstick: NEGATIVE
Ketones, ur: NEGATIVE mg/dL
Leukocytes,Ua: NEGATIVE
Nitrite: NEGATIVE
Protein, ur: 100 mg/dL — AB
Specific Gravity, Urine: 1.009 (ref 1.005–1.030)
pH: 5 (ref 5.0–8.0)

## 2020-11-30 LAB — BASIC METABOLIC PANEL
Anion gap: 7 (ref 5–15)
BUN: 37 mg/dL — ABNORMAL HIGH (ref 8–23)
CO2: 20 mmol/L — ABNORMAL LOW (ref 22–32)
Calcium: 8.5 mg/dL — ABNORMAL LOW (ref 8.9–10.3)
Chloride: 107 mmol/L (ref 98–111)
Creatinine, Ser: 2.21 mg/dL — ABNORMAL HIGH (ref 0.61–1.24)
GFR, Estimated: 32 mL/min — ABNORMAL LOW (ref >60)
Glucose, Bld: 139 mg/dL — ABNORMAL HIGH (ref 70–99)
Potassium: 4 mmol/L (ref 3.5–5.1)
Sodium: 134 mmol/L — ABNORMAL LOW (ref 135–145)

## 2020-11-30 LAB — CBC
HCT: 33 % — ABNORMAL LOW (ref 39.0–52.0)
Hemoglobin: 10.8 g/dL — ABNORMAL LOW (ref 13.0–17.0)
MCH: 28.1 pg (ref 26.0–34.0)
MCHC: 32.7 g/dL (ref 30.0–36.0)
MCV: 85.9 fL (ref 80.0–100.0)
Platelets: 270 10*3/uL (ref 150–400)
RBC: 3.84 MIL/uL — ABNORMAL LOW (ref 4.22–5.81)
RDW: 13.2 % (ref 11.5–15.5)
WBC: 8.4 10*3/uL (ref 4.0–10.5)
nRBC: 0 % (ref 0.0–0.2)

## 2020-11-30 LAB — CULTURE, BLOOD (ROUTINE X 2)
Culture: NO GROWTH
Culture: NO GROWTH
Special Requests: ADEQUATE
Special Requests: ADEQUATE

## 2020-11-30 MED ORDER — SODIUM CHLORIDE 0.9 % IV BOLUS
500.0000 mL | Freq: Once | INTRAVENOUS | Status: AC
  Administered 2020-11-30: 500 mL via INTRAVENOUS

## 2020-11-30 NOTE — Progress Notes (Signed)
[facility] [place], [place] [address]  Internal MEDICINE  Office Visit Note  Patient Name: Larry Callahan  [id number]  [id number]  Date of Service: 11/30/2020  Chief Complaint  Patient presents with   Follow-up    Pulm follow, patient has been throwing up chunks of blood, left side pain, med review, pt blacked out 11/29/2020 due to bp being high      HPI  Patient is seen here for pulmonary follow-up and has multiple medical problems today he is complaining of several issues. He is coughing up blood with left-sided pain.  Complains of shortness of breath. Complains of pain in his right axilla with an abscess  Complains of right-sided abdominal pain feels like stretching on the left side of his abdomen he is unable to get comfortable. Patient does not have a car and has been calling the paramedics frequently patient has been in the emergency room about 4-5 times in the last 1 month he was also hospitalized on the earlier part of May with influenza patient has history of heart failure reduced EF of 35 to 40%. On 5/21 patient went to the emergency room with COPD exacerbation On 5/23 he went to the emergency his room for shortness of breath and flulike symptoms. On 6 /6 he was in the ED again with symptoms of passing out syncope and uncontrolled diabetes. On 6/14 patient was seen in the ED again for acute intractable headache Patient had had multiple CT of the abdomen and pelvis done he also had a CT angiogram CT abdomen did show a large umbilical hernia along with growing adnexal mass patient has history of left nephrectomy but has abnormal right renal complex cyst has been also anemic chronic renal disease. Patient does have some social economic problems with his understanding of medical issues and the need to address as far as routine and urgency is concerned I spoke with the hospitalist and recommended patient needs to probably come in for hospital  admission admission to address some of his concerns, so his frequent ED visits can be stopped and his concerns can be addressed and managed accordingly Current Medication: Outpatient Encounter Medications as of 11/30/2020  Medication Sig Note   albuterol (VENTOLIN HFA) 108 (90 Base) MCG/ACT inhaler Inhale 2 puffs into the lungs every 6 (six) hours as needed for wheezing or shortness of breath.    aspirin EC 81 MG EC tablet Take 1 tablet (81 mg total) by mouth daily. Swallow whole.    Blood Glucose Calibration (TRUE METRIX LEVEL 1) Low SOLN Use as directed    Blood Glucose Monitoring Suppl (TRUE METRIX AIR GLUCOSE METER) DEVI 1 Device by Does not apply route 2 (two) times daily.    budesonide-formoterol (SYMBICORT) 160-4.5 MCG/ACT inhaler Inhale 2 puffs into the lungs 2 (two) times daily.    carvedilol (COREG) 6.25 MG tablet Take 1 tablet (6.25 mg total) by mouth 2 (two) times daily with a meal.    cetirizine (ZYRTEC) 10 MG tablet Take 10 mg by mouth daily.    escitalopram (LEXAPRO) 10 MG tablet Take one tab po qd for anxiety with food (Patient taking differently: Take 10 mg by mouth daily. Take one tab po qd for anxiety with food)    ferrous gluconate (FERGON) 324 MG tablet Take 1 tablet (324 mg total) by mouth daily with breakfast.    furosemide (LASIX) 40 MG tablet Take one to tabs once or twice days for fluid // swelling  gabapentin (NEURONTIN) 100 MG capsule Take 1 capsule (100 mg total) by mouth in the morning, at noon, and at bedtime.    glimepiride (AMARYL) 2 MG tablet Take one tab po qd with supper    glucose blood (TRUE METRIX BLOOD GLUCOSE TEST) test strip Use as instructed twice daily diag E11.65    hydrALAZINE (APRESOLINE) 100 MG tablet Take 1 tablet (100 mg total) by mouth 3 (three) times daily. 10/30/2020: Pt takes in the morning, another at 1 pm and the last one in the evening   insulin glargine, 1 Unit Dial, (TOUJEO) 300 UNIT/ML Solostar Pen Inject 26 Units into the skin daily.     ipratropium-albuterol (DUONEB) 0.5-2.5 (3) MG/3ML SOLN Take 3 mLs by nebulization every 6 (six) hours as needed.    isosorbide mononitrate (IMDUR) 30 MG 24 hr tablet Take 1 tablet (30 mg total) by mouth 2 (two) times daily.    meclizine (ANTIVERT) 25 MG tablet Take 1 tablet (25 mg total) by mouth 3 (three) times daily as needed for dizziness or nausea.    mupirocin ointment (BACTROBAN) 2 % Apply 1 application topically daily. Qd to excision site    NOVOFINE PEN NEEDLE 32G X 6 MM MISC Use as directed    omeprazole (PRILOSEC) 40 MG capsule Take 1 capsule (40 mg total) by mouth in the morning and at bedtime.    potassium chloride SA (KLOR-CON) 20 MEQ tablet Take 1 tablet (20 mEq total) by mouth 2 (two) times daily.    tiotropium (SPIRIVA HANDIHALER) 18 MCG inhalation capsule Place 1 capsule (18 mcg total) into inhaler and inhale daily.    TRUEplus Lancets 33G MISC Use as directed twice daily diag E11.65    No facility-administered encounter medications on file as of 11/30/2020.    Surgical History: Past Surgical History:  Procedure Laterality Date   BACK SURGERY     CARDIAC CATHETERIZATION     CHOLECYSTECTOMY     COLONOSCOPY WITH PROPOFOL N/A 04/12/2019   Procedure: COLONOSCOPY WITH PROPOFOL;  Surgeon: [name], MD;  Location: [facility] ENDOSCOPY;  Service: Gastroenterology;  Laterality: N/A;   ESOPHAGOGASTRODUODENOSCOPY N/A 04/12/2019   Procedure: ESOPHAGOGASTRODUODENOSCOPY (EGD);  Surgeon: [name], MD;  Location: [facility] ENDOSCOPY;  Service: Gastroenterology;  Laterality: N/A;   ESOPHAGOGASTRODUODENOSCOPY (EGD) WITH PROPOFOL N/A 11/21/2019   Procedure: ESOPHAGOGASTRODUODENOSCOPY (EGD) WITH PROPOFOL;  Surgeon: [name], MD;  Location: [facility] ENDOSCOPY;  Service: Gastroenterology;  Laterality: N/A;   FLEXIBLE BRONCHOSCOPY Bilateral 05/17/2019   Procedure: FLEXIBLE BRONCHOSCOPY;  Surgeon: [name], MD;  Location: [name] ORS;  Service: Pulmonary;  Laterality: Bilateral;    left arm surgery     nephrectomy Left    PARATHYROIDECTOMY     RIGHT HEART CATH N/A 03/29/2019   Procedure: RIGHT HEART CATH;  Surgeon: [name], MD;  Location: [facility];  Service: Cardiovascular;  Laterality: N/A;    Medical History: Past Medical History:  Diagnosis Date   Allergy    COPD (chronic obstructive pulmonary disease) ([place])    Coronary artery disease    Diabetes mellitus without complication (HCC)    Diastolic CHF (HCC)    GERD (gastroesophageal reflux disease)    Hyperlipidemia    Hypertension    Renal insufficiency    Sleep apnea     Family History: Family History  Problem Relation Age of Onset   Diabetes Mother        2003   Lung cancer Father    Diabetes Sister  Hypertension Sister    Heart disease Sister    Cancer Sister    Bone cancer Brother     Social History   Socioeconomic History   Marital status: Single    Spouse name: Not on file   Number of children: Not on file   Years of education: Not on file   Highest education level: Not on file  Occupational History   Not on file  Tobacco Use   Smoking status: Former    Pack years: 0.00    Types: Cigarettes   Smokeless tobacco: Former    Types: Nurse, children's Use: Never used  Substance and Sexual Activity   Alcohol use: No   Drug use: No   Sexual activity: Not on file  Other Topics Concern   Not on file  Social History Narrative   Not on file   Social Determinants of Health   Financial Resource Strain: Not on file  Food Insecurity: Not on file  Transportation Needs: Not on file  Physical Activity: Not on file  Stress: Not on file  Social Connections: Not on file  Intimate Partner Violence: Not on file      Review of Systems  Constitutional:  Negative for chills, fatigue and unexpected weight change.  HENT:  Negative for congestion, postnasal drip, rhinorrhea, sneezing and sore throat.   Eyes:  Negative for redness.  Respiratory:  Positive  for shortness of breath. Negative for cough and chest tightness.   Cardiovascular:  Positive for chest pain. Negative for palpitations.       Elevated bP  Gastrointestinal:  Negative for abdominal pain, constipation, diarrhea, nausea and vomiting.  Genitourinary:  Negative for dysuria and frequency.  Musculoskeletal:  Negative for arthralgias, back pain, joint swelling and neck pain.  Skin:  Negative for rash.  Neurological: Negative.  Negative for tremors and numbness.  Hematological:  Negative for adenopathy. Does not bruise/bleed easily.  Psychiatric/Behavioral:  Negative for behavioral problems (Depression), sleep disturbance and suicidal ideas. The patient is not nervous/anxious.    Vital Signs: BP (!) 157/92   Pulse 71   Temp 98.1 F (36.7 C)   Resp 16   Ht _0  (1.803 m)   Wt 276 lb 6.4 oz (125.4 kg)   SpO2 95%   BMI 38.55 kg/m    Physical Exam Constitutional:      Appearance: Normal appearance.  HENT:     Head: Normocephalic and atraumatic.     Nose: Nose normal.     Mouth/Throat:     Mouth: Mucous membranes are moist.     Pharynx: No posterior oropharyngeal erythema.  Eyes:     Extraocular Movements: Extraocular movements intact.     Pupils: Pupils are equal, round, and reactive to light.  Cardiovascular:     Pulses: Normal pulses.     Heart sounds: Normal heart sounds.  Pulmonary:     Effort: Pulmonary effort is normal.     Breath sounds: Normal breath sounds.  Abdominal:     General: There is distension.     Comments: Right sided abdominal pain  Neurological:     General: No focal deficit present.     Mental Status: He is alert.  Psychiatric:        Mood and Affect: Mood normal.        Behavior: Behavior normal.       Assessment/Plan: 1. 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  2. Coughing up blood This is vague not sure if it is pulmonary or GI source he is not sure he probably needs to see GI previous CT chest are negative avoid ordering excessive testing  3. Iron deficiency anemia, unspecified iron deficiency anemia type Patient needs work-up for low hemoglobin will need to see hematology while in the hospital for further work-up  4. Stage 3b chronic kidney disease ([place]) Worsening kidney functions patient is to see nephrology for further work-up only has right-sided kidney, has a complex mass/cyst left-sided complex and renal cyst patient needs further work-up  General Counseling: kiptyn rafuse understanding of the findings of todays visit and agrees with plan of treatment. I have discussed any further diagnostic evaluation that may be needed or ordered today. We also reviewed his medications today. he has been encouraged to call the office with any questions or concerns that should arise related to todays visit.  Spoke with Dr. Blaine Hamper for possible admission not only for medical problems and concern however patient needs assistance of the social worker for possible arrangements of his follow-up patient depends on [place] transportation and it is hard to coordinate it will be in the best interest of the patient if some of his appointments can be organized by the social worker in the hospital The medical decision is complex involved high level of critical thinking in patient care. Since that there were no direct admit available due to shortage of beds patient was sent to the emergency room per direction of Dr. Blaine Hamper  Total time spent:30 Minutes Time spent includes review of chart, medications, test results, and follow up plan with the patient.   [place] Controlled Substance Database was reviewed by me.  This patient was seen by [name], FNP-C in collaboration with Dr. Clayborn Bigness as a part of collaborative care agreement.   Arlicia Paquette R. Valetta Fuller, MSN, FNP-C Internal medicine

## 2020-11-30 NOTE — ED Notes (Signed)
Says he is here to be admitted at request of his pcp.  He does not know diagnosis.  Says he has left flank pain since Friday.  Says that kidney was removed years ago.  He is ambulatory and in nad.

## 2020-11-30 NOTE — ED Provider Notes (Signed)
[facility] Emergency Department Provider Note   ____________________________________________    I have reviewed the triage vital signs and the nursing notes.   HISTORY  Chief Complaint Flank Pain     HPI Larry Callahan is a 65 y.o. male with history of COPD, CAD, diabetes who presents with complaints of left flank pain.  Patient reports he saw his PCP today, complained of left flank pain.  They apparently sent him to the emergency department for further evaluation of this.  He was under the impression that he may be direct admitted but does not know why he would be direct admitted.  No fevers chills.  No dysuria.  Denies hematuria.  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  Symptoms have been intermittent over the last 2 days  Past Medical History:  Diagnosis Date   Allergy    COPD (chronic obstructive pulmonary disease) ([place])    Coronary artery disease    Diabetes mellitus without complication (HCC)    Diastolic CHF ([place])    GERD (gastroesophageal reflux disease)    Hyperlipidemia    Hypertension    Renal insufficiency    Sleep apnea     Patient Active Problem List   Diagnosis Date Noted   Syncope and collapse 11/16/2020   Demand ischemia ([place])    Influenza A 10/30/2020   Chest pain 10/06/2020   HLD (hyperlipidemia) 10/06/2020   History of CVA (cerebrovascular accident) 09/25/2020   Gastroesophageal reflux disease without esophagitis 04/19/2020   Pneumonia due to COVID-19 virus 01/30/2020   Elevated troponin I level 01/29/2020   Suspected COVID-19 virus infection 01/29/2020   Postural dizziness with presyncope 01/29/2020   Hematemesis 01/09/2020   Hospital discharge follow-up 11/26/2019   Cellulitis of left hand 09/26/2019   Generalized weakness 08/08/2019   Obesity (BMI 30-39.9) 08/08/2019   Acute right hip pain 06/17/2019   Inability to ambulate due to hip 06/17/2019   CKD stage 3 due to type 2 diabetes mellitus  (HCC)    Hypervolemia    Acute on chronic diastolic CHF (congestive heart failure) ([place])    Type 2 diabetes mellitus with stage 3b chronic kidney disease, with long-term current use of insulin ([place])    Hypomagnesemia    Full code status 05/20/2019   Pleuritic chest pain    Multifocal pneumonia 05/19/2019   Acute kidney injury superimposed on CKD 3b ([place]) 05/09/2019   Hemoptysis 04/09/2019   Shortness of breath 03/26/2019   Uncontrolled type 2 diabetes mellitus with insulin therapy ([place]) 03/12/2019   Acute on chronic heart failure with preserved ejection fraction (HFpEF) (HCC)    Chronic kidney disease, stage 3b (HCC)    Chronic systolic CHF (congestive heart failure) ([place]) 34/74/2595   Diastolic dysfunction 63/87/5643   Iron deficiency anemia 12/31/2018   Atopic dermatitis 11/24/2018   Screening for colon cancer 11/06/2017   Uncontrolled type 2 diabetes mellitus with hyperglycemia ([place]) 11/06/2017   Hidradenitis 06/09/2017   Atherosclerotic heart disease of native coronary artery without angina pectoris 06/09/2017   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   Obstructive sleep apnea, adult 06/09/2017   Morbid (severe) obesity due to excess calories ([place]) 06/09/2017   Cigarette nicotine dependence with nicotine-induced disorder 06/09/2017   Diabetic polyneuropathy associated with type 2 diabetes mellitus ([place]) 06/09/2017   Allergic rhinitis due to pollen 06/09/2017   Mixed hyperlipidemia 06/09/2017   COPD (chronic obstructive pulmonary disease) ([place]) 06/09/2017   Dyspnea on exertion 06/09/2017  Wheezing 06/09/2017   Dysuria 06/09/2017   Snoring 06/09/2017   Essential hypertension 06/09/2017   Personal history of other malignant neoplasm of kidney 06/09/2017   Pain in right hip 06/09/2017   Impacted cerumen, bilateral 06/09/2017   Secondary hyperparathyroidism, not elsewhere classified ([place]) 06/09/2017   Type 2 diabetes mellitus with hyperglycemia  ([place]) 06/09/2017   Tinea corporis 06/09/2017    Past Surgical History:  Procedure Laterality Date   BACK SURGERY     CARDIAC CATHETERIZATION     CHOLECYSTECTOMY     COLONOSCOPY WITH PROPOFOL N/A 04/12/2019   Procedure: COLONOSCOPY WITH PROPOFOL;  Surgeon: [name], MD;  Location: [name] ENDOSCOPY;  Service: Gastroenterology;  Laterality: N/A;   ESOPHAGOGASTRODUODENOSCOPY N/A 04/12/2019   Procedure: ESOPHAGOGASTRODUODENOSCOPY (EGD);  Surgeon: [name], MD;  Location: [facility] ENDOSCOPY;  Service: Gastroenterology;  Laterality: N/A;   ESOPHAGOGASTRODUODENOSCOPY (EGD) WITH PROPOFOL N/A 11/21/2019   Procedure: ESOPHAGOGASTRODUODENOSCOPY (EGD) WITH PROPOFOL;  Surgeon: [name], MD;  [facility] END[facility]COPY;  Service: Gastroenterology;  Laterality: N/A;   FLEXIBLE BRONCHOSCOPY Bilateral 05/17/2019   Procedure: FLEXIBLE BRONCHOSCOPY;  Surgeon: [name], MD;  Location: [name] ORS;  Service: Pulmonary;  Laterality: Bilateral;   left arm surgery     nephrectomy Left    PARATHYROIDECTOMY     RIGHT HEART CATH N/A 03/29/2019   Procedure: RIGHT HEART CATH;  Surgeon: [name], MD;  Location: [place] CV LAB;  Service: Cardiovascular;  Laterality: N/A;    Prior to Admission medications   Medication Sig Start Date End Date Taking? Authorizing Provider  albuterol (VENTOLIN HFA) 108 (90 Base) MCG/ACT inhaler Inhale 2 puffs into the lungs every 6 (six) hours as needed for wheezing or shortness of breath. 08/31/20   Lavera Guise, MD  aspirin EC 81 MG EC tablet Take 1 tablet (81 mg total) by mouth daily. Swallow whole. 11/01/20   Lorella Nimrod, MD  Blood Glucose Calibration (TRUE METRIX LEVEL 1) Low SOLN Use as directed 04/04/18   [name], NP  Blood Glucose Monitoring Suppl (TRUE METRIX AIR GLUCOSE METER) DEVI 1 Device by Does not apply route 2 (two) times daily. 04/13/18   [name], NP  budesonide-formoterol (SYMBICORT) 160-4.5 MCG/ACT inhaler  Inhale 2 puffs into the lungs 2 (two) times daily. 07/20/20   Lavera Guise, MD  carvedilol (COREG) 6.25 MG tablet Take 1 tablet (6.25 mg total) by mouth 2 (two) times daily with a meal. 10/08/20   Jennye Boroughs, MD  cetirizine (ZYRTEC) 10 MG tablet Take 10 mg by mouth daily.    [provider]  escitalopram (LEXAPRO) 10 MG tablet Take one tab po qd for anxiety with food Patient taking differently: Take 10 mg by mouth daily. Take one tab po qd for anxiety with food 07/17/20   Lavera Guise, MD  ferrous gluconate (FERGON) 324 MG tablet Take 1 tablet (324 mg total) by mouth daily with breakfast. 08/01/19   [name], NP  furosemide (LASIX) 40 MG tablet Take one to tabs once or twice days for fluid // swelling 07/17/20   Lavera Guise, MD  gabapentin (NEURONTIN) 100 MG capsule Take 1 capsule (100 mg total) by mouth in the morning, at noon, and at bedtime. 07/17/20   Lavera Guise, MD  glimepiride Jari Sportsman) 2 MG tablet Take one tab po qd with supper 07/17/20   Lavera Guise, MD  glucose blood (TRUE METRIX BLOOD GLUCOSE TEST) test strip Use as instructed twice daily  diag E11.65 07/30/19   [name], NP  hydrALAZINE (APRESOLINE) 100 MG tablet Take 1 tablet (100 mg total) by mouth 3 (three) times daily. 07/17/20   Lavera Guise, MD  insulin glargine, 1 Unit Dial, (TOUJEO) 300 UNIT/ML Solostar Pen Inject 26 Units into the skin daily. 04/30/20   [name], NP  ipratropium-albuterol (DUONEB) 0.5-2.5 (3) MG/3ML SOLN Take 3 mLs by nebulization every 6 (six) hours as needed. 10/11/20   Lavera Guise, MD  isosorbide mononitrate (IMDUR) 30 MG 24 hr tablet Take 1 tablet (30 mg total) by mouth 2 (two) times daily. 10/08/20   Jennye Boroughs, MD  meclizine (ANTIVERT) 25 MG tablet Take 1 tablet (25 mg total) by mouth 3 (three) times daily as needed for dizziness or nausea. 11/25/20   Paulette Blanch, MD  mupirocin ointment (BACTROBAN) 2 % Apply 1 application topically daily. Qd to excision site 10/06/20    Ralene Bathe, MD  NOVOFINE PEN NEEDLE 32G X 6 MM MISC Use as directed 10/15/20   Lavera Guise, MD  omeprazole (PRILOSEC) 40 MG capsule Take 1 capsule (40 mg total) by mouth in the morning and at bedtime. 07/17/20   Lavera Guise, MD  potassium chloride SA (KLOR-CON) 20 MEQ tablet Take 1 tablet (20 mEq total) by mouth 2 (two) times daily. 07/17/20   Lavera Guise, MD  tiotropium (SPIRIVA HANDIHALER) 18 MCG inhalation capsule Place 1 capsule (18 mcg total) into inhaler and inhale daily. 11/10/20 02/08/21  Lavera Guise, MD  TRUEplus Lancets 33G MISC Use as directed twice daily diag E11.65 07/30/19   [name], NP     Allergies Penicillins  Family History  Problem Relation Age of Onset   Diabetes Mother        2003   Lung cancer Father    Diabetes Sister    Hypertension Sister    Heart disease Sister    Cancer Sister    Bone cancer Brother     Social History Social History   Tobacco Use   Smoking status: Former    Pack years: 0.00    Types: Cigarettes   Smokeless tobacco: Former    Types: Nurse, children's Use: Never used  Substance Use Topics   Alcohol use: No   Drug use: No    Review of Systems  Constitutional: No fever/chills Eyes: No visual changes.  ENT: No sore throat. Cardiovascular: Denies chest pain. Respiratory: Denies shortness of breath. Gastrointestinal: As above Genitourinary: Negative for dysuria.  As above Musculoskeletal: Negative for back pain. Skin: Negative for rash. Neurological: Negative for headaches or weakness   ____________________________________________   PHYSICAL EXAM:  VITAL SIGNS: ED Triage Vitals  Enc Vitals Group     BP 11/30/20 1234 (!) 168/111     Pulse Rate 11/30/20 1234 (!) 116     Resp 11/30/20 1234 20     Temp 11/30/20 1234 97.8 F (36.6 C)     Temp Source 11/30/20 1234 Oral     SpO2 11/30/20 1234 93 %     Weight --      Height --      Head Circumference --      Peak Flow --      Pain Score  11/30/20 1254 7     Pain Loc --      Pain Edu? --      Excl. in [place]? --     Constitutional: Alert and  oriented.  Eyes: Conjunctivae are normal.  Head: Atraumatic.  Mouth/Throat: Mucous membranes are moist.   Neck:  Painless ROM Cardiovascular: Normal rate, regular rhythm. Grossly normal heart sounds.  Good peripheral circulation. Respiratory: Normal respiratory effort.  No retractions.  Gastrointestinal: Soft and nontender. No distention.  No CVA tenderness.  Overall reassuring exam  Musculoskeletal: No lower extremity tenderness nor edema.  Warm and well perfused Neurologic:  Normal speech and language. No gross focal neurologic deficits are appreciated.  Skin:  Skin is warm, dry and intact. No rash noted. Psychiatric: Mood and affect are normal. Speech and behavior are normal.  ____________________________________________   LABS (all labs ordered are listed, but only abnormal results are displayed)  Labs Reviewed  URINALYSIS, COMPLETE (UACMP) WITH MICROSCOPIC - Abnormal; Notable for the following components:      Result Value   Color, Urine YELLOW (*)    APPearance CLEAR (*)    Glucose, UA 50 (*)    Protein, ur 100 (*)    All other components within normal limits  BASIC METABOLIC PANEL - Abnormal; Notable for the following components:   Sodium 134 (*)    CO2 20 (*)    Glucose, Bld 139 (*)    BUN 37 (*)    Creatinine, Ser 2.21 (*)    Calcium 8.5 (*)    GFR, Estimated 32 (*)    All other components within normal limits  CBC - Abnormal; Notable for the following components:   RBC 3.84 (*)    Hemoglobin 10.8 (*)    HCT 33.0 (*)    All other components within normal limits   ____________________________________________  EKG   ____________________________________________  RADIOLOGY  CT abdomen pelvis reviewed by me ____________________________________________   PROCEDURES  Procedure(s) performed: No  Procedures   Critical Care performed:  No ____________________________________________   INITIAL IMPRESSION / ASSESSMENT AND PLAN / ED COURSE  Pertinent labs & imaging results that were available during my care of the patient were reviewed by me and considered in my medical decision making (see chart for details).   Patient presents with left-sided flank discomfort as noted above, question possible diverticulitis given no left kidney.  Urinalysis overall reassuring.  Lab work demonstrates normal white blood cell count.  CT scan performed which is overall reassuring, discussed the need for close follow-up regarding right-sided kidney lesion, patient agreed to follow-up with his PCP for this.    ____________________________________________   FINAL CLINICAL IMPRESSION(S) / ED DIAGNOSES  Final diagnoses:  Left flank pain        Note:  This document was prepared using Dragon voice recognition software and may include unintentional dictation errors.    Lavonia Drafts, MD 11/30/20 [phone]

## 2020-11-30 NOTE — ED Triage Notes (Signed)
Pt via POV from home. Pt c/o L flank pain. Pt states he does not have a kidney on the L side. Denies NVD. Denies fevers. Pt was seen at his PCP and she sent him over here. Pt is A&Ox4 and NAD.

## 2020-11-30 NOTE — Discharge Instructions (Addendum)
Please discuss with Dr. Humphrey Rolls that you need further imaging of right renal mass/cyst as we discussed

## 2020-12-01 ENCOUNTER — Telehealth: Payer: Self-pay | Admitting: Cardiovascular Disease

## 2020-12-01 NOTE — Telephone Encounter (Signed)
Attempted to schedule.  LMOV to call office.  ° °

## 2020-12-01 NOTE — Telephone Encounter (Signed)
-----   Message from Marykay Lex sent at 10/30/2020  1:50 PM EDT ----- Regarding: 1 month fu LVM for patient to schedule 1 month fu with G[facility] or Ryan after 10/30/20 visit. Patient was in ED when called

## 2020-12-04 IMAGING — CR DG CHEST 2V
1 series · 3 of 3 positions shown · non-contrast
Comparison: 04/10/2019

CLINICAL DATA: complaints of shortness of breath, fatigue since
this morning. Pt here for same recently, admission due to CHF. HTN,
CHF, DM, former smoker

EXAM:
CHEST - 2 VIEW

[Series 1: dg chest 2 view · 0.14mm/px · 3 of 3 slices shown]
[im 1/3]
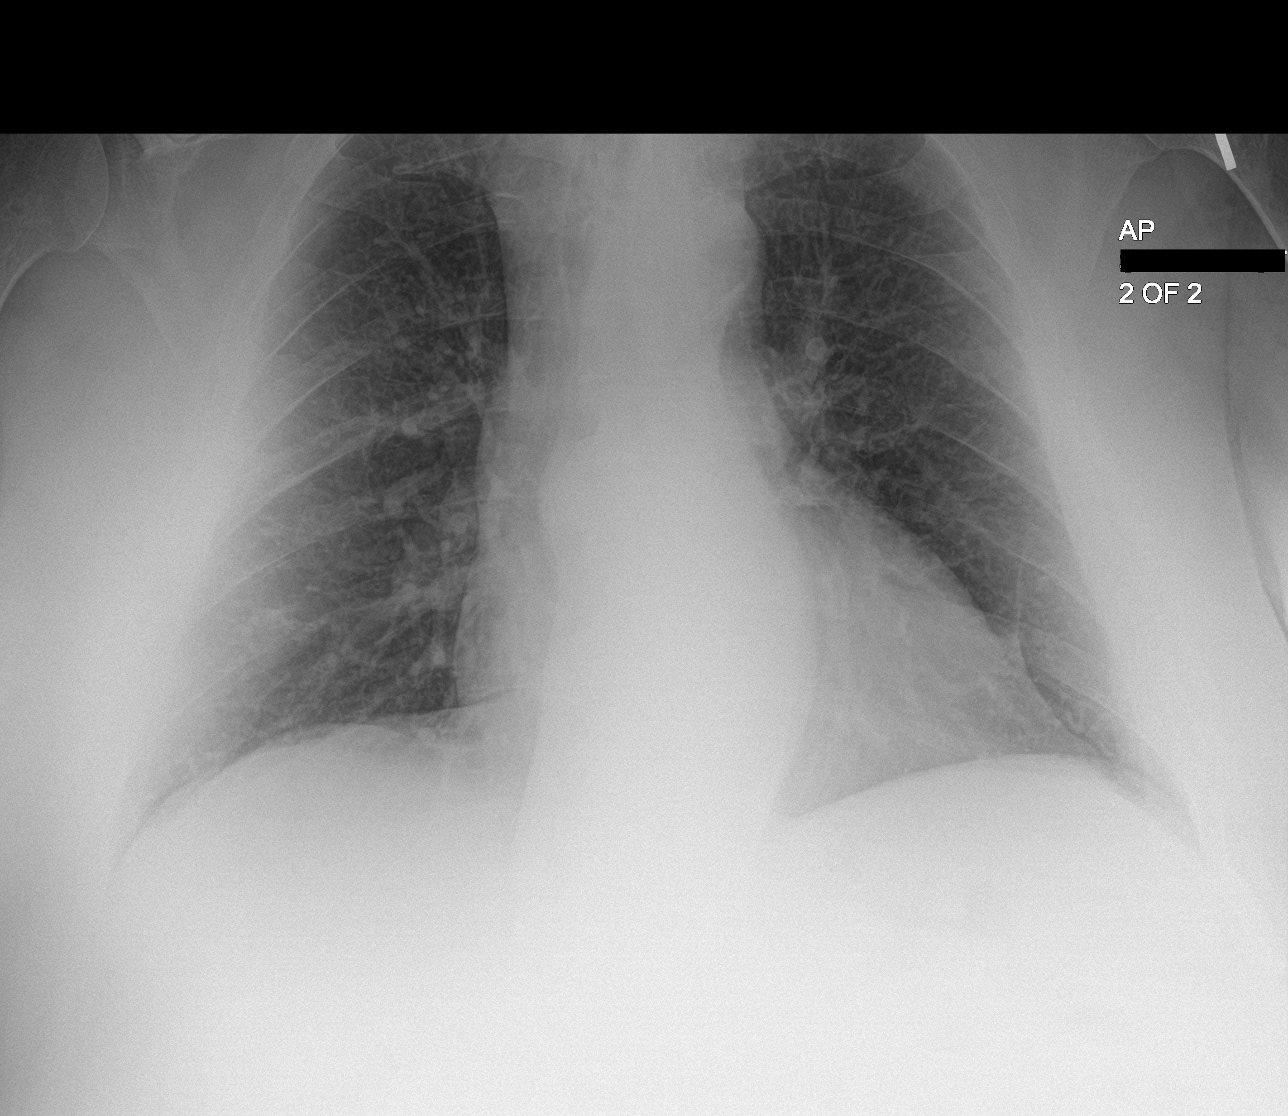
[im 2/3]
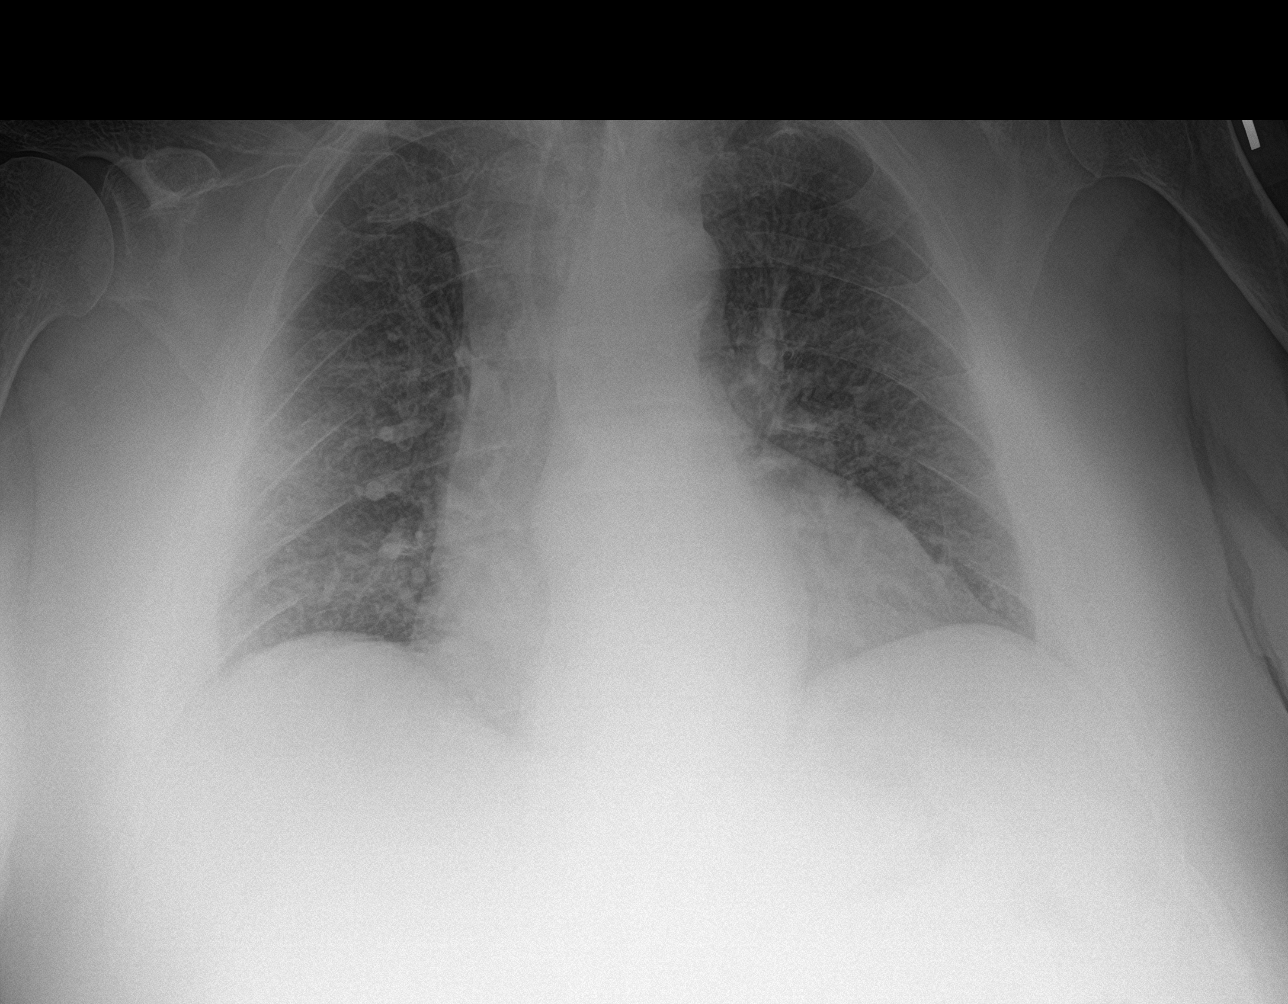
[im 3/3]
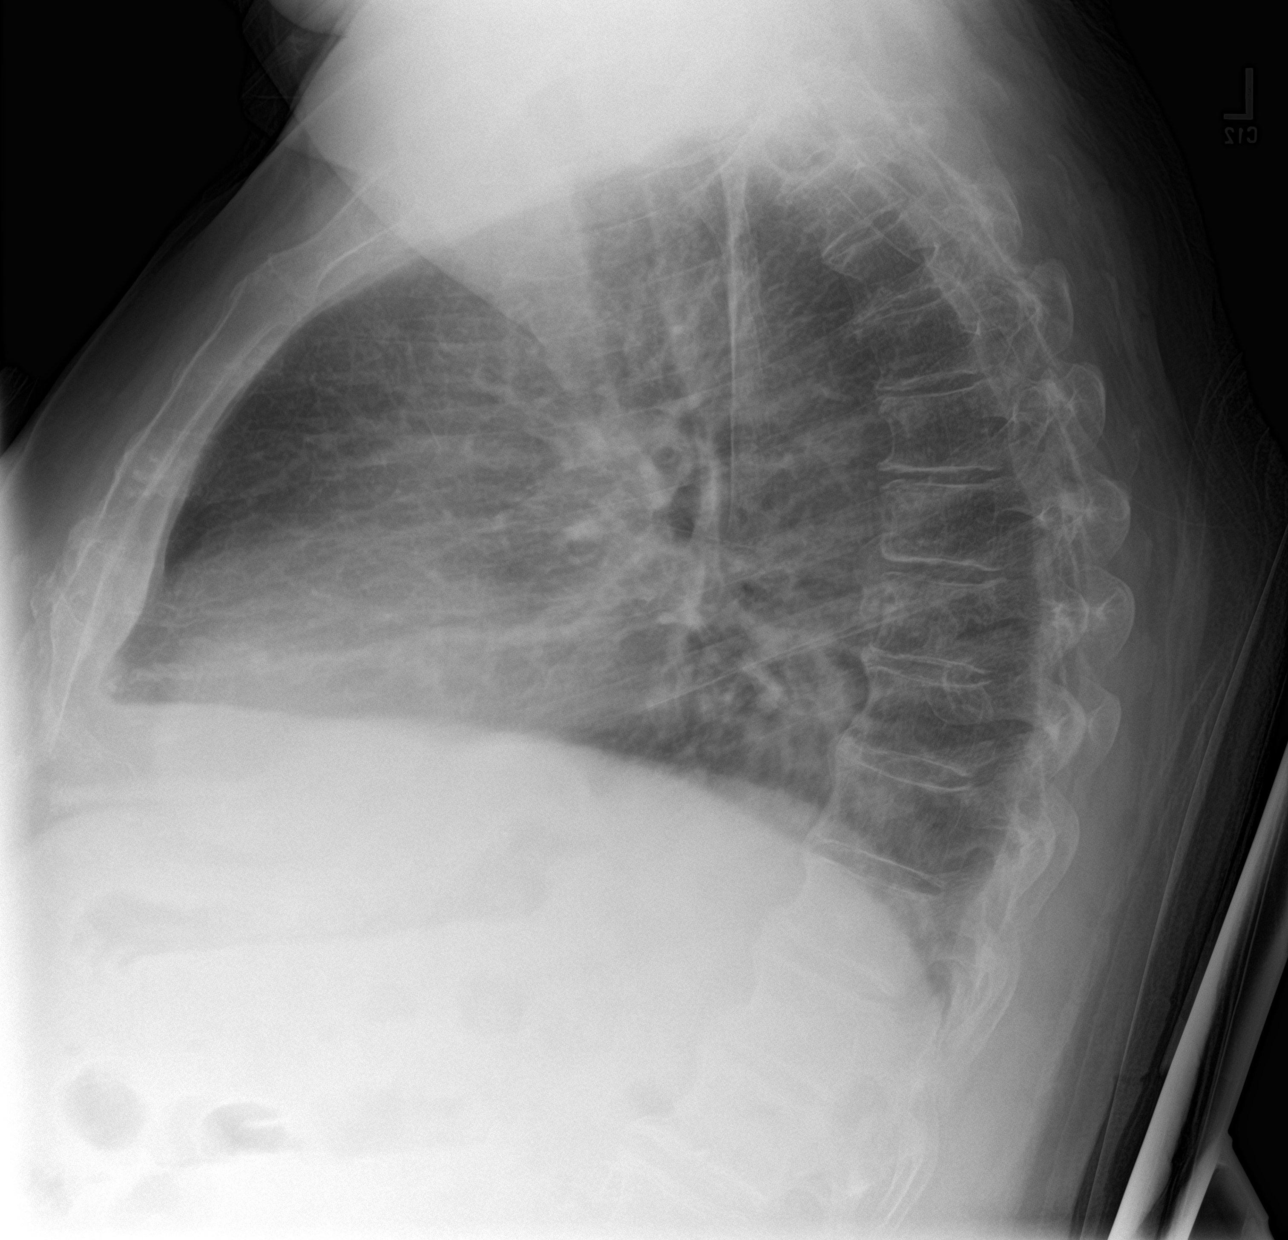

[3 of 3 positions shown; findings below may reference images not displayed]

FINDINGS: Mild central pulmonary vascular congestion and mild diffuse
interstitial prominent, increased since previous.

Heart size upper limits normal. Aortic Atherosclerosis
(PPGCO-170.0).

No effusion.

Anterior vertebral endplate spurring at multiple levels in the lower
thoracic spine.
IMPRESSION: Worsening central pulmonary vascular congestion and interstitial
edema.

## 2020-12-04 IMAGING — CT CT RENAL STONE PROTOCOL
2 of 4 series · 16 of 46 positions shown, 18 images · non-contrast
Comparison: 01/31/2014 CT abdomen/pelvis.

CLINICAL DATA: Left flank pain. Generalized abdominal pain. Bloody
stools.

EXAM:
CT ABDOMEN AND PELVIS WITHOUT CONTRAST
TECHNIQUE: Multidetector CT imaging of the abdomen and pelvis was performed
following the standard protocol without IV contrast.

[Series 2: stone full standard · axial · 0.98mm/px · z∈[-484,-34]mm · 13 of 98 slices shown, 15 images]
[im 4/98  soft-tissue]
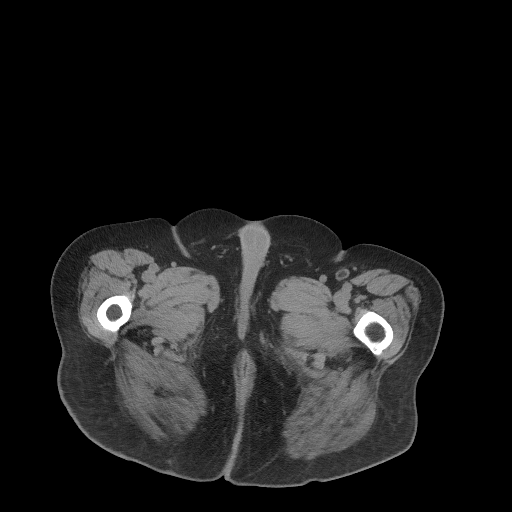
[im 4/98  bone]
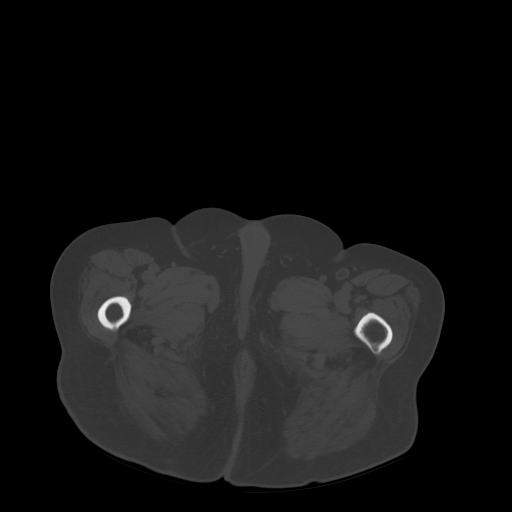
[im 12/98  soft-tissue]
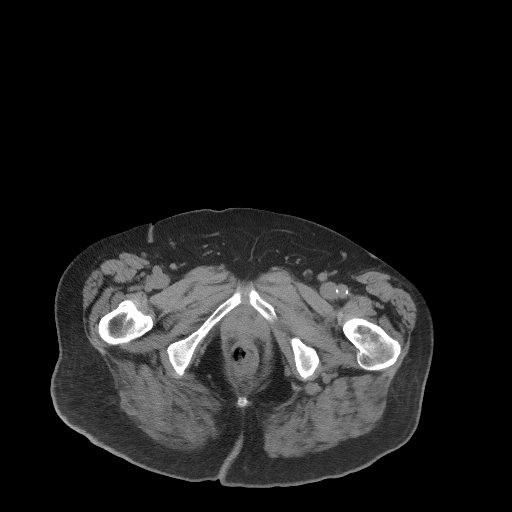
[im 20/98  soft-tissue]
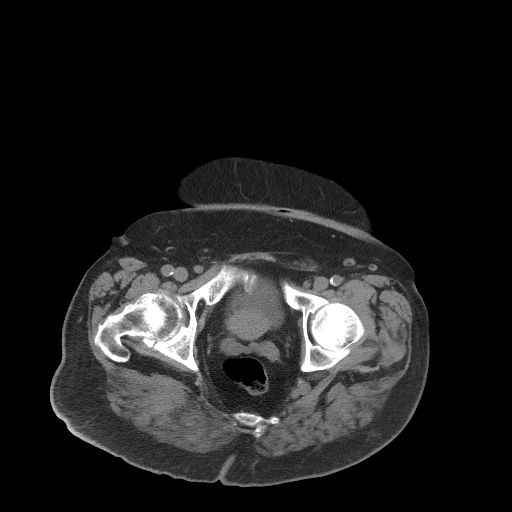
[im 28/98  soft-tissue]
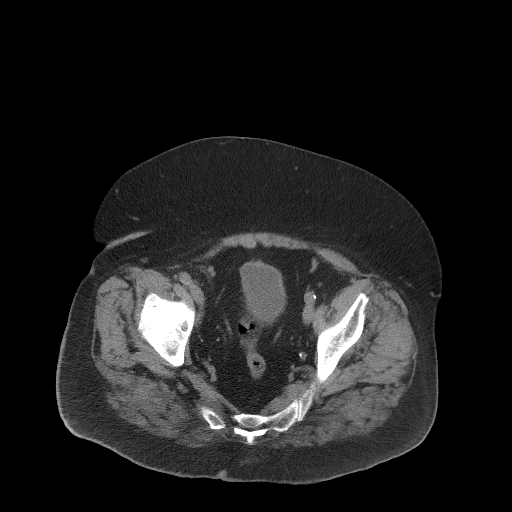
[im 35/98  soft-tissue]
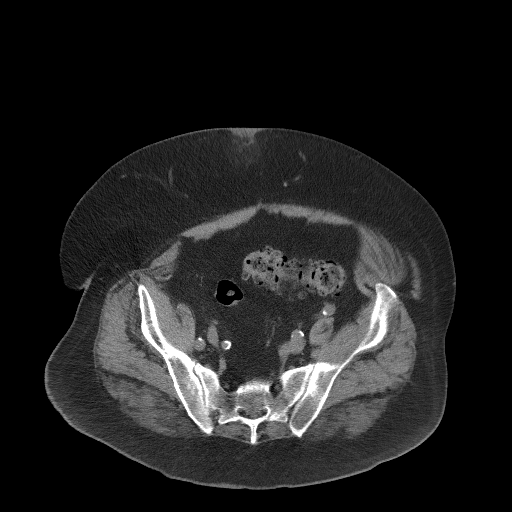
[im 43/98  soft-tissue]
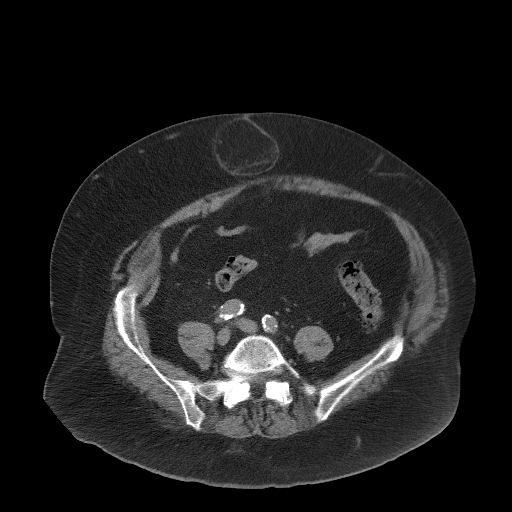
[im 51/98  soft-tissue]
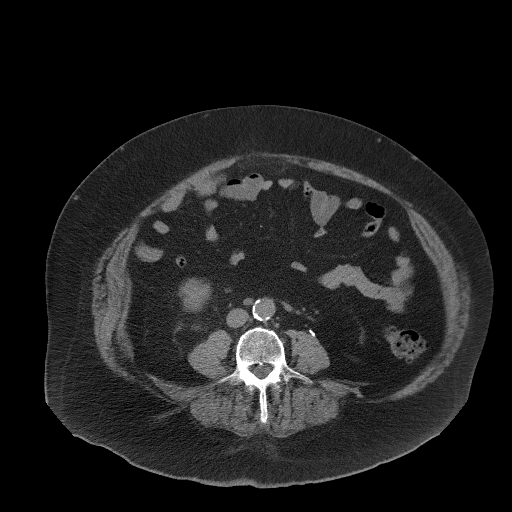
[im 55/98  soft-tissue]
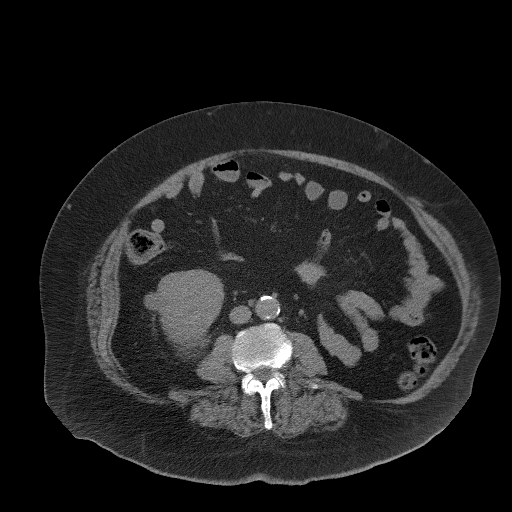
[im 63/98  soft-tissue]
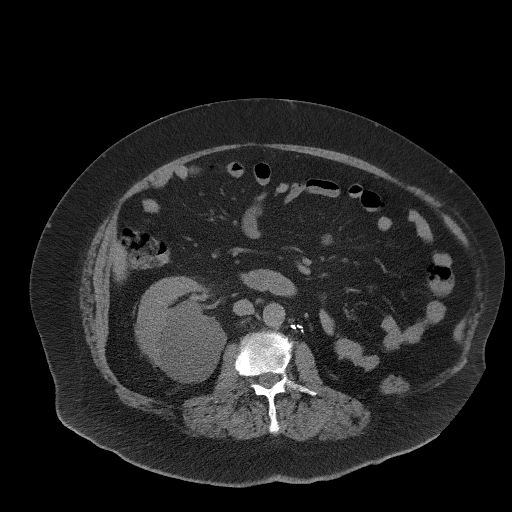
[im 63/98  bone]
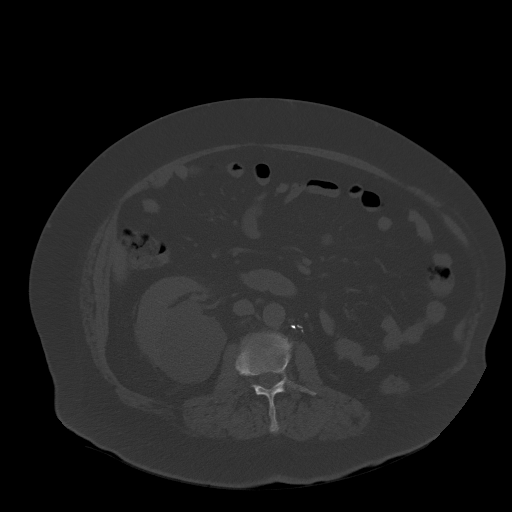
[im 70/98  soft-tissue]
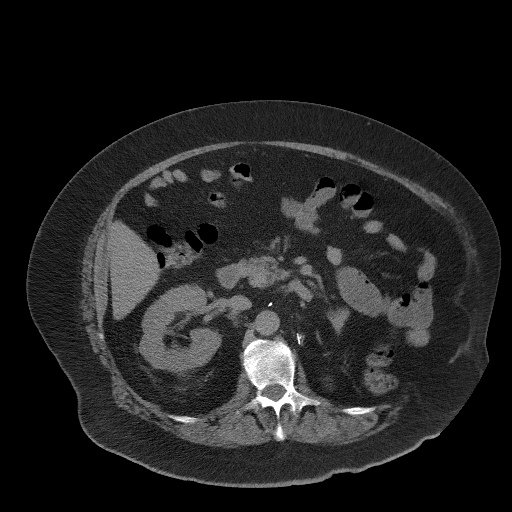
[im 78/98  soft-tissue]
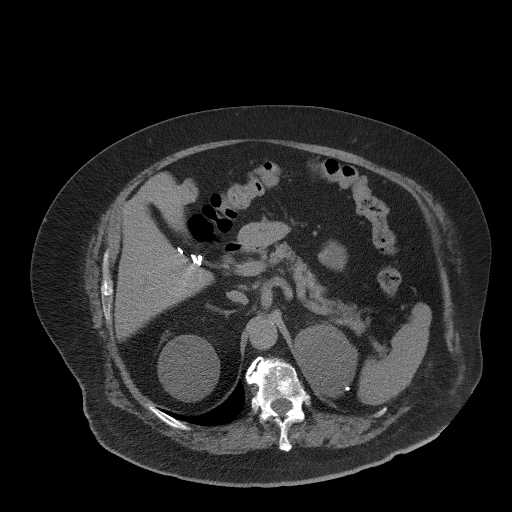
[im 86/98  soft-tissue]
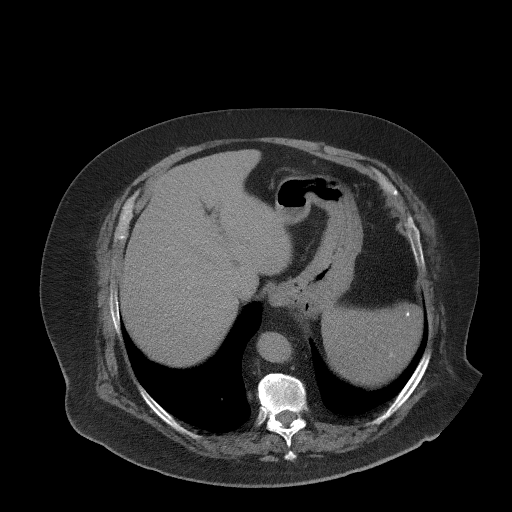
[im 94/98  soft-tissue]
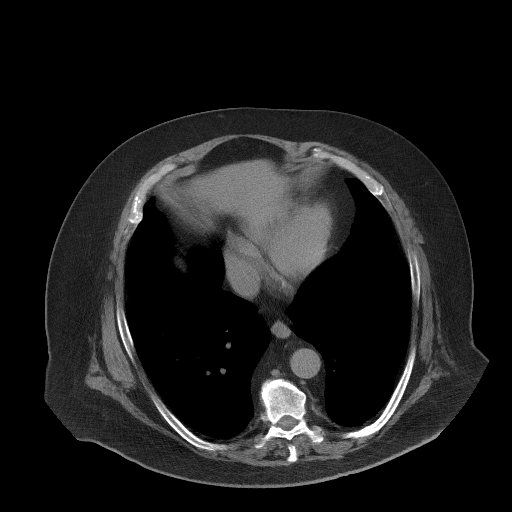

[Series 5: coronal · coronal · 0.92mm/px · 3 of 182 slices shown]
[im 61/182  soft-tissue]
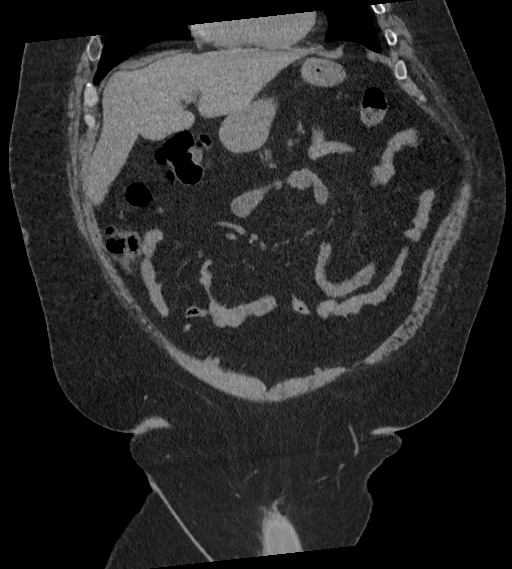
[im 81/182  soft-tissue]
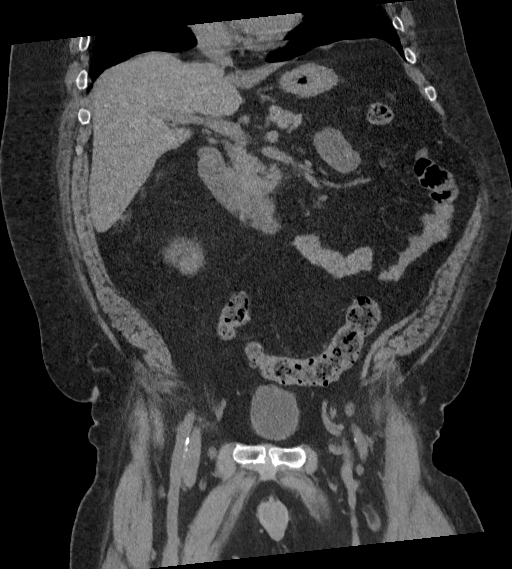
[im 101/182  soft-tissue]
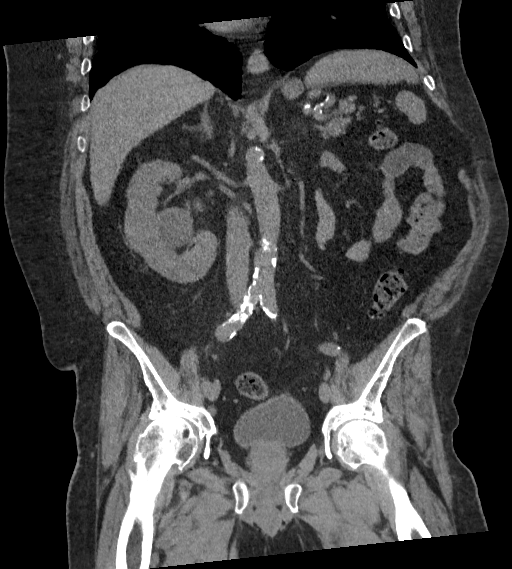

[16 of 46 positions shown; findings below may reference images not displayed]

FINDINGS: Lower chest: No significant pulmonary nodules or acute consolidative
airspace disease.

Hepatobiliary: Normal liver size. No liver mass. Cholecystectomy. No
biliary ductal dilatation.

Pancreas: Normal, with no mass or duct dilation.

Spleen: Normal size spleen. Granulomatous calcifications throughout
the spleen. No splenic mass.

Adrenals/Urinary Tract: Normal right adrenal. Left adrenal 7.2 x
cm mass with density 7 HU (series 2/image 21), increased from 5.1 x
4.1 cm on 01/31/2014 CT abdomen study, compatible with adenoma. Left
nephrectomy with no mass or fluid collection in the left nephrectomy
bed. No right hydronephrosis. No right renal stones. Multiple simple
right renal cysts, largest 7.7 cm in the posterior lower right
kidney. Normal caliber right ureter. No right ureteral stones.
Normal bladder with no bladder stones.

Stomach/Bowel: Normal non-distended stomach. Normal caliber small
bowel with no small bowel wall thickening. Normal appendix. Marked
left colonic diverticulosis, with no large bowel wall thickening or
significant pericolonic fat stranding.

Vascular/Lymphatic: Atherosclerotic nonaneurysmal abdominal aorta.
No pathologically enlarged lymph nodes in the abdomen or pelvis.

Reproductive: Mildly enlarged prostate.

Other: No pneumoperitoneum, ascites or focal fluid collection.
Moderate to large fat containing umbilical hernia, slightly
increased from prior. Chronic diastasis of posterior left lateral
abdominal wall.

Musculoskeletal: No aggressive appearing focal osseous lesions.
Moderate thoracolumbar spondylosis.
IMPRESSION: 1. No acute abnormality. No evidence of bowel obstruction or acute
bowel inflammation. Marked sigmoid diverticulosis, with no evidence
of acute diverticulitis.
2. No right hydronephrosis.  No urolithiasis.
3. Left adrenal 7.2 cm mass demonstrates density compatible with an
adenoma, increased in size from 5.1 cm on 6356 CT study. Given the
large size and continued growth, surgical consultation may be
considered.
4. Left nephrectomy.
5. Mild prostatomegaly.
6. Moderate to large fat containing umbilical hernia, slightly
increased.
7.  Aortic Atherosclerosis ([id number]-IV3.3).

## 2020-12-08 ENCOUNTER — Other Ambulatory Visit: Payer: Self-pay

## 2020-12-08 ENCOUNTER — Ambulatory Visit (INDEPENDENT_AMBULATORY_CARE_PROVIDER_SITE_OTHER): Payer: Medicare PPO | Admitting: Dermatology

## 2020-12-08 DIAGNOSIS — L732 Hidradenitis suppurativa: Secondary | ICD-10-CM | POA: Diagnosis not present

## 2020-12-08 DIAGNOSIS — L02411 Cutaneous abscess of right axilla: Secondary | ICD-10-CM

## 2020-12-08 MED ORDER — DOXYCYCLINE HYCLATE 100 MG PO TABS: ORAL_TABLET | ORAL | 0 refills | Status: DC

## 2020-12-08 NOTE — Patient Instructions (Addendum)
If you have any questions or concerns for your doctor, please call our main line at [phone] and press option 4 to reach your doctor's medical assistant. If no one answers, please leave a voicemail as directed and we will return your call as soon as possible. Messages left after 4 pm will be answered the following business day.   You may also send [name] a message via [place]. We typically respond to MyChart messages within 1-2 business days.  For prescription refills, please ask your pharmacy to contact our office. Our fax number is [phone].  If you have an urgent issue when the clinic is closed that cannot wait until the next business day, you can page your doctor at the number below.    Please note that while we do our best to be available for urgent issues outside of office hours, we are not available 24/7.   If you have an urgent issue and are unable to reach [name], you may choose to seek medical care at your doctor's office, retail clinic, urgent care center, or emergency room.  If you have a medical emergency, please immediately call 911 or go to the emergency department.  Pager Numbers  - Dr. Nehemiah Massed: [phone]  - Dr. Laurence Ferrari: [phone]  - Dr. Nicole Kindred: [phone]  In the event of inclement weather, please call our main line at [phone] for an update on the status of any delays or closures.  Dermatology Medication Tips: Please keep the boxes that topical medications come in in order to help keep track of the instructions about where and how to use these. Pharmacies typically print the medication instructions only on the boxes and not directly on the medication tubes.   If your medication is too expensive, please contact our office at [phone] option 4 or send [name] a message through [place].   We are unable to tell what your co-pay for medications will be in advance as this is different depending on your insurance coverage. However, we may be able to find a substitute  medication at lower cost or fill out paperwork to get insurance to cover a needed medication.   If a prior authorization is required to get your medication covered by your insurance company, please allow [name] 1-2 business days to complete this process.  Drug prices often vary depending on where the prescription is filled and some pharmacies may offer cheaper prices.  The website www.goodrx.com contains coupons for medications through different pharmacies. The prices here do not account for what the cost may be with help from insurance (it may be cheaper with your insurance), but the website can give you the price if you did not use any insurance.  - You can print the associated coupon and take it with your prescription to the pharmacy.  - You may also stop by our office during regular business hours and pick up a GoodRx coupon card.  - If you need your prescription sent electronically to a different pharmacy, notify our office through [facility] or by phone at [phone] option 4.  Doxycycline should be taken with food to prevent nausea. Do not lay down for 30 minutes after taking. Be cautious with sun exposure and use good sun protection while on this medication. Pregnant women should not take this medication.

## 2020-12-08 NOTE — Progress Notes (Signed)
Follow-Up Visit   Subjective  Larry Callahan is a 65 y.o. male who presents for the following: recurrence abscess (At previous excision site - patient c/o pain, tenderness, and oozing. He was prescribed Doxycycline 100mg  po BID at the ED, but has been taking it for another issue. ).  The following portions of the chart were reviewed this encounter and updated as appropriate:   Tobacco  Allergies  Meds  Problems  Med Hx  Surg Hx  Fam Hx      Review of Systems:  No other skin or systemic complaints except as noted in HPI or Assessment and Plan.  Objective  Well appearing patient in no apparent distress; mood and affect are within normal limits.  A focused examination was performed including the right axilla. Relevant physical exam findings are noted in the Assessment and Plan.  R axilla Scarring of the axilla.   Assessment & Plan  Hidradenitis R axilla  Hidradenitis Suppurativa is a chronic; persistent; non-curable, but treatable condition due to abnormal inflamed sweat glands in the body folds (axilla, inframammary, groin, medial thighs), causing recurrent painful cysts and scarring. It can be associated with severe scarring acne and cysts; abscesses and scarring of scalp. The goal is control and prevention of flares, as it is not curable. Scars are permanent and can be thickened. Treatment may include daily use of topical medication and oral antibiotics.  Oral isotretinoin may also be helpful.  For more severe cases, Humira (a biologic injection) may be prescribed to decrease the inflammatory process and prevent flares.  When indicated, inflamed cysts may also be treated surgically.  Restart Doxycycline 100mg  po BID x 1 week. Then decrease to QD thereafter. Doxycycline should be taken with food to prevent nausea. Do not lay down for 30 minutes after taking. Be cautious with sun exposure and use good sun protection while on this medication. Pregnant women should not take  this medication.    Cutaneous abscess of right axilla Right Axilla  Secondary to hidradenitis - Start Doxycycline 100mg  po BID x 1 week. Then decrease use to QD thereafter. Doxycycline should be taken with food to prevent nausea. Do not lay down for 30 minutes after taking. Be cautious with sun exposure and use good sun protection while on this medication. Pregnant women should not take this medication.    Incision and Drainage - Right Axilla Location: R axilla  Informed Consent: Discussed risks (permanent scarring, light or dark discoloration, infection, pain, bleeding, bruising, redness, damage to adjacent structures, and recurrence of the lesion) and benefits of the procedure, as well as the alternatives.  Informed consent was obtained.  Preparation: The area was prepped with alcohol.  Anesthesia: Lidocaine 1% with epinephrine  Procedure Details: An incision was made overlying the lesion. The lesion drained pus and cyst material. The area was flushed with sterile saline. Ointment and a sterile pressure dressing were applied. The patient tolerated procedure well.  Total number of lesions drained: 1 lesion x 3 (#5) punches performed  Plan: The patient was instructed on post-op care. Recommend OTC analgesia as needed for pain.   doxycycline (VIBRA-TABS) 100 MG tablet - Right Axilla Take one tab po BID x 1 week. Then decrease to QD thereafter. Take with food.  Return for appointment as scheduled.  Luther Redo, CMA, am acting as scribe for [name], MD .  Documentation: I have reviewed the above documentation for accuracy and completeness, and I agree with the above.  [name], MD

## 2020-12-09 IMAGING — CR DG HIP (WITH OR WITHOUT PELVIS) 2-3V*R*
4 series · 4 of 4 positions shown · non-contrast
Comparison: 3933

CLINICAL DATA: Fall

EXAM:
DG HIP (WITH OR WITHOUT PELVIS) 2-3V RIGHT

[pelvis ap (1 of 2)]
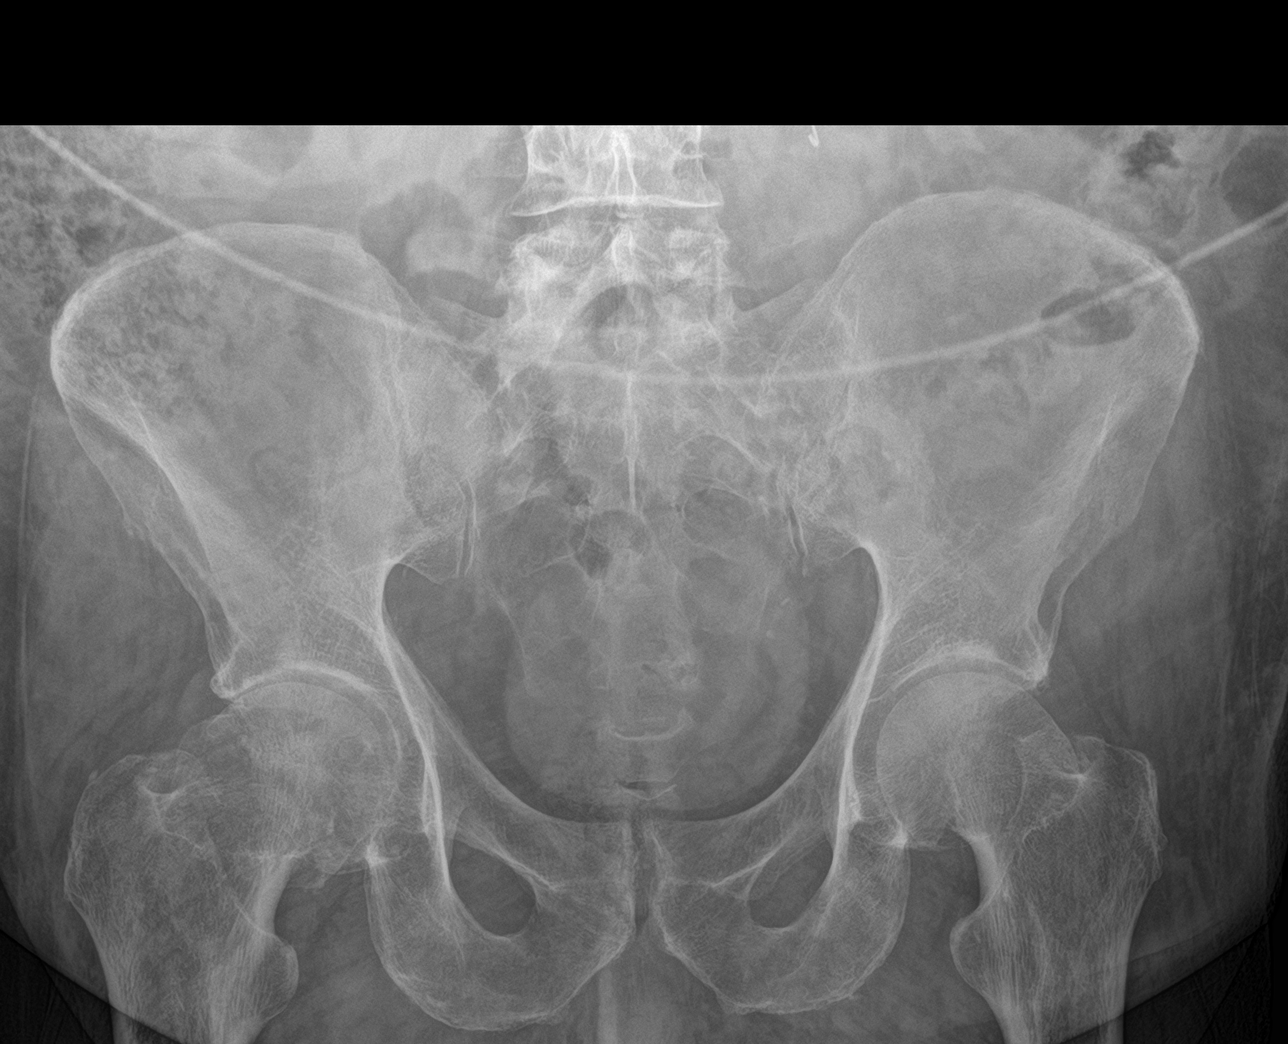

[pelvis ap (2 of 2)]
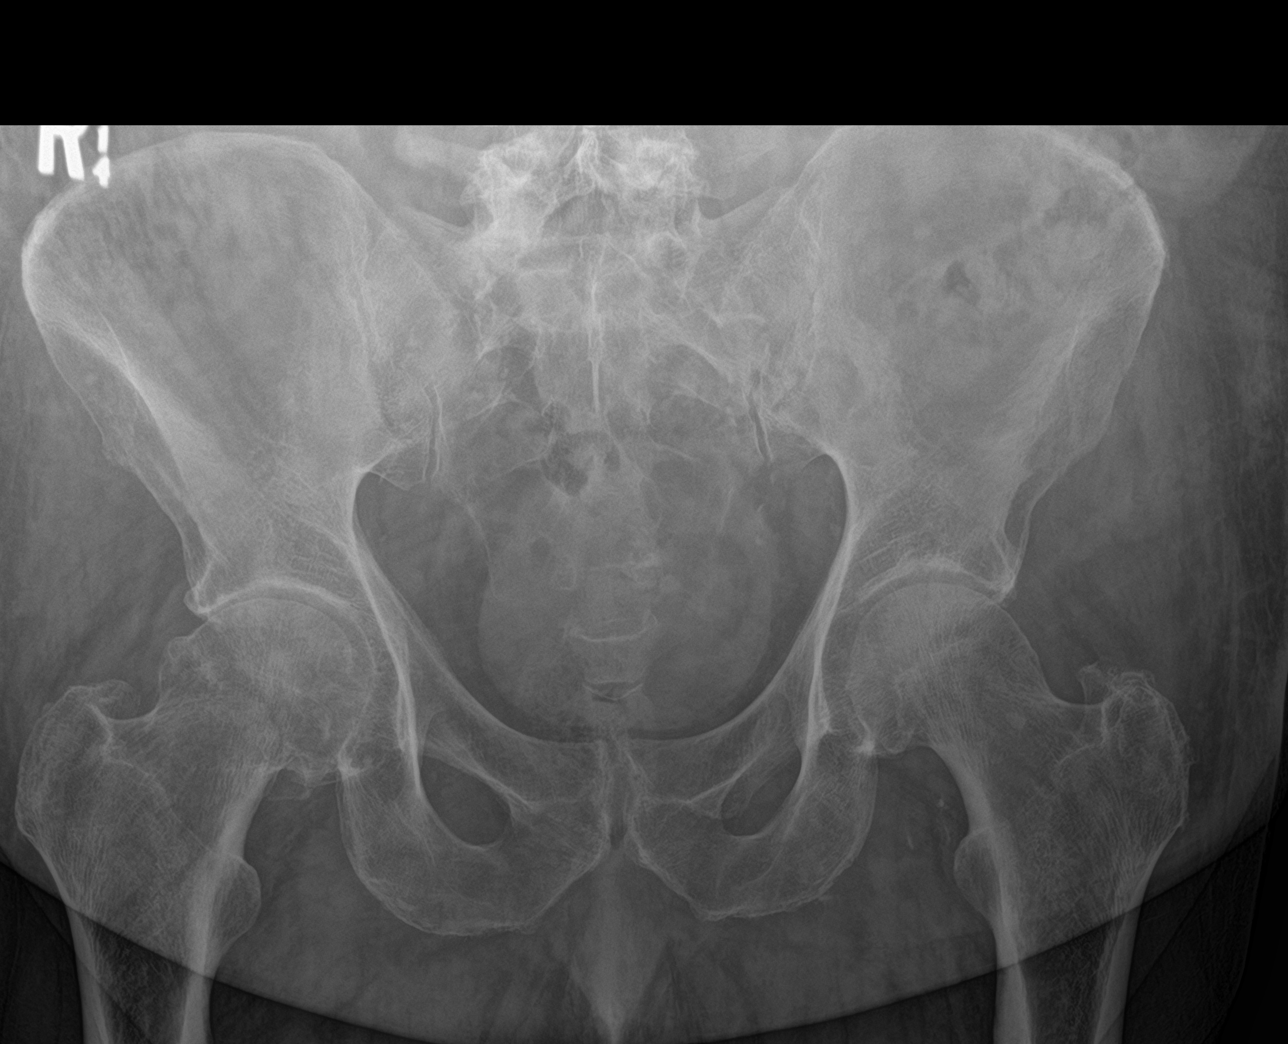

[hip ap]
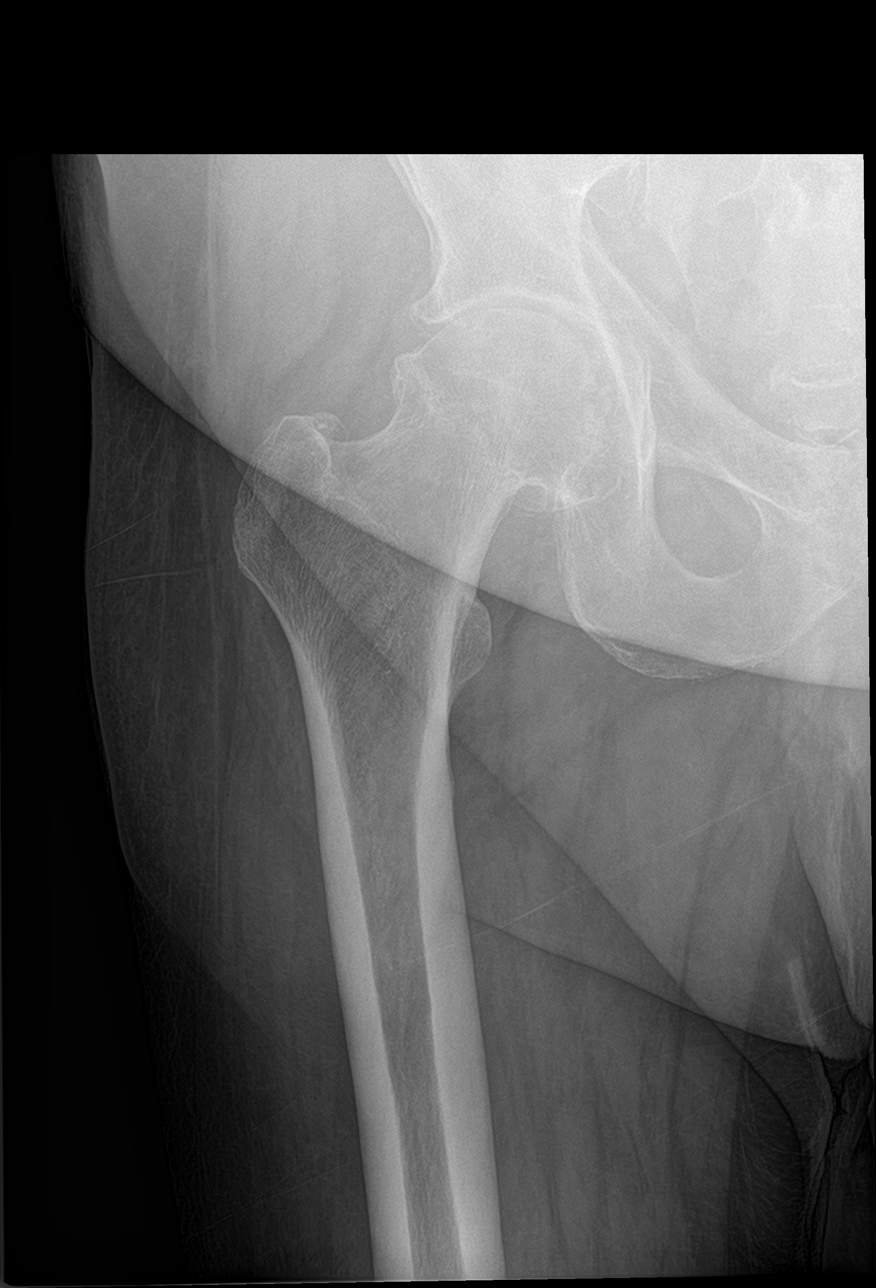

[hip lat]
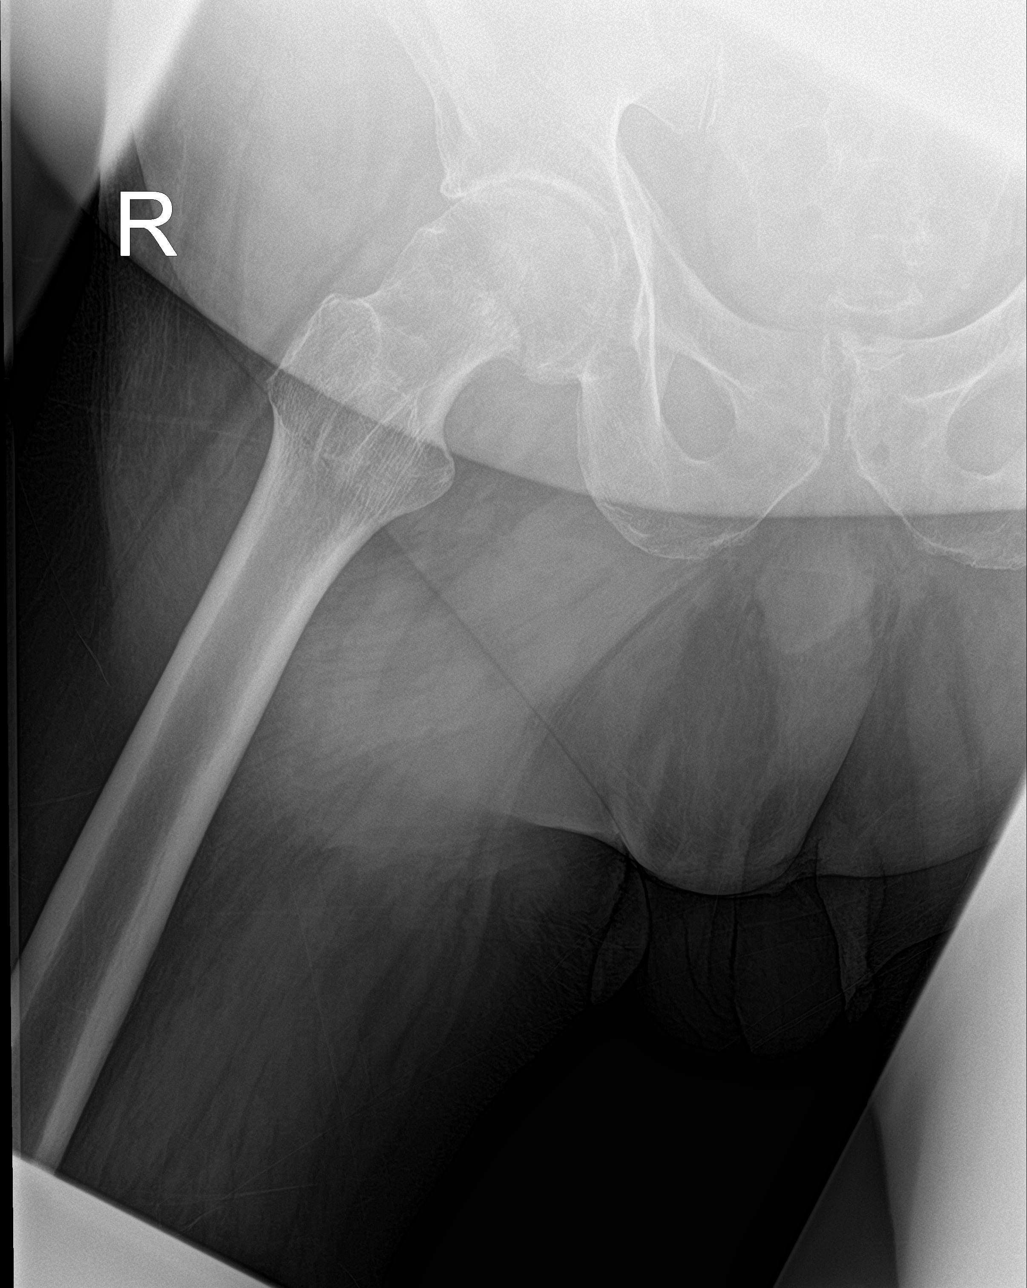

[4 of 4 positions shown; findings below may reference images not displayed]

FINDINGS: Alignment is anatomic. There is no acute fracture. Changes of
osteoarthritis are present similar to 3933.
IMPRESSION: No acute fracture at the right hip.

## 2020-12-09 IMAGING — CR DG HIP (WITH OR WITHOUT PELVIS) 2-3V*L*
3 series · 4 of 4 positions shown · non-contrast
Comparison: None.

CLINICAL DATA: Fall

EXAM:
DG HIP (WITH OR WITHOUT PELVIS) 2-3V LEFT

[Series 1: pelvis ap · 0.14mm/px · 2 of 2 slices shown]
[im 1/2]
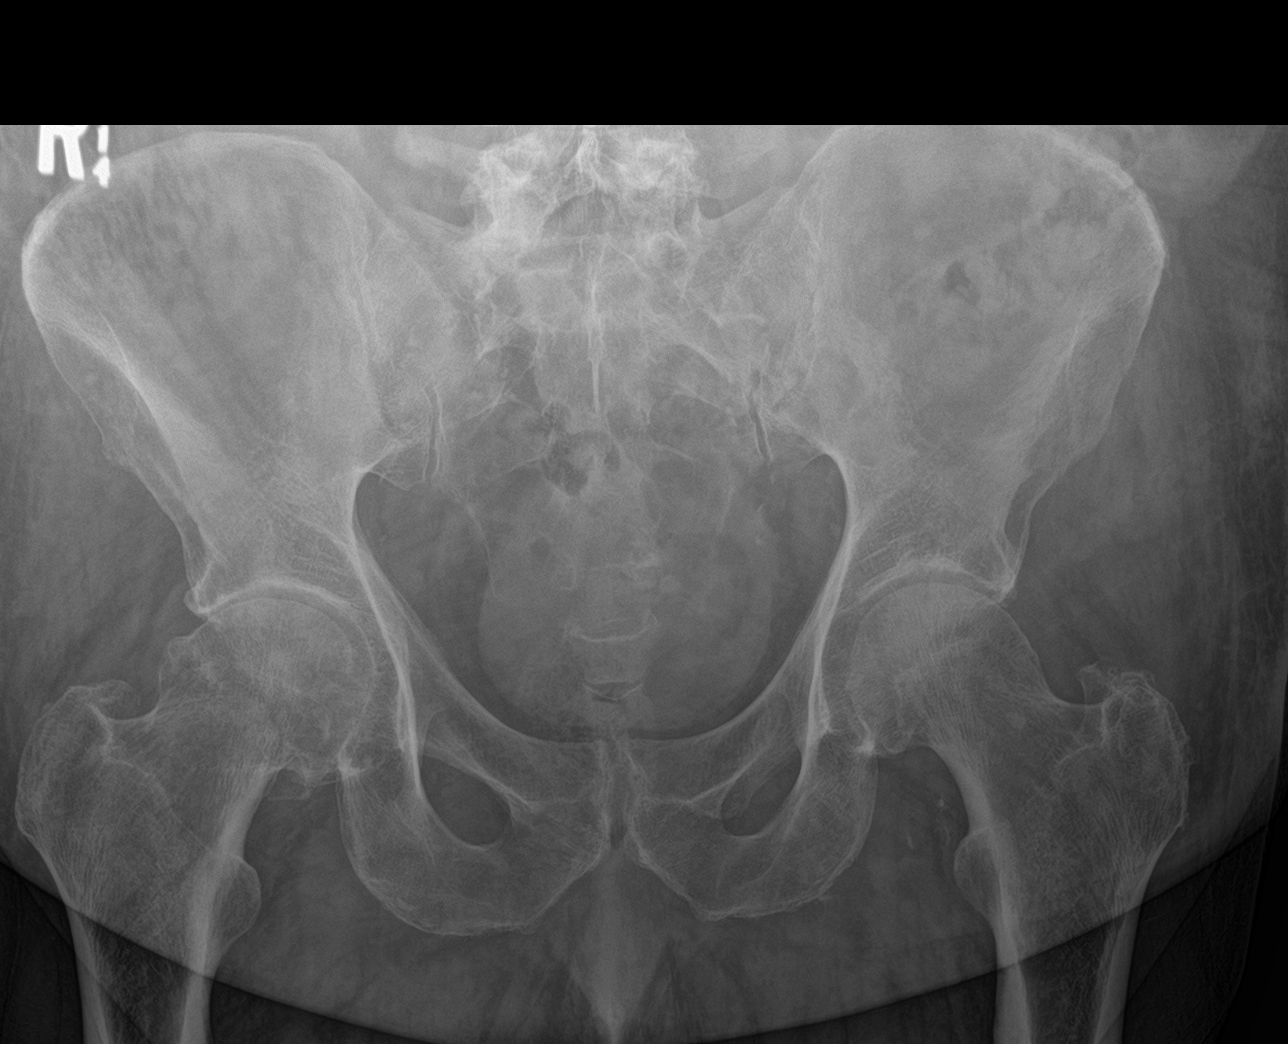
[im 2/2]
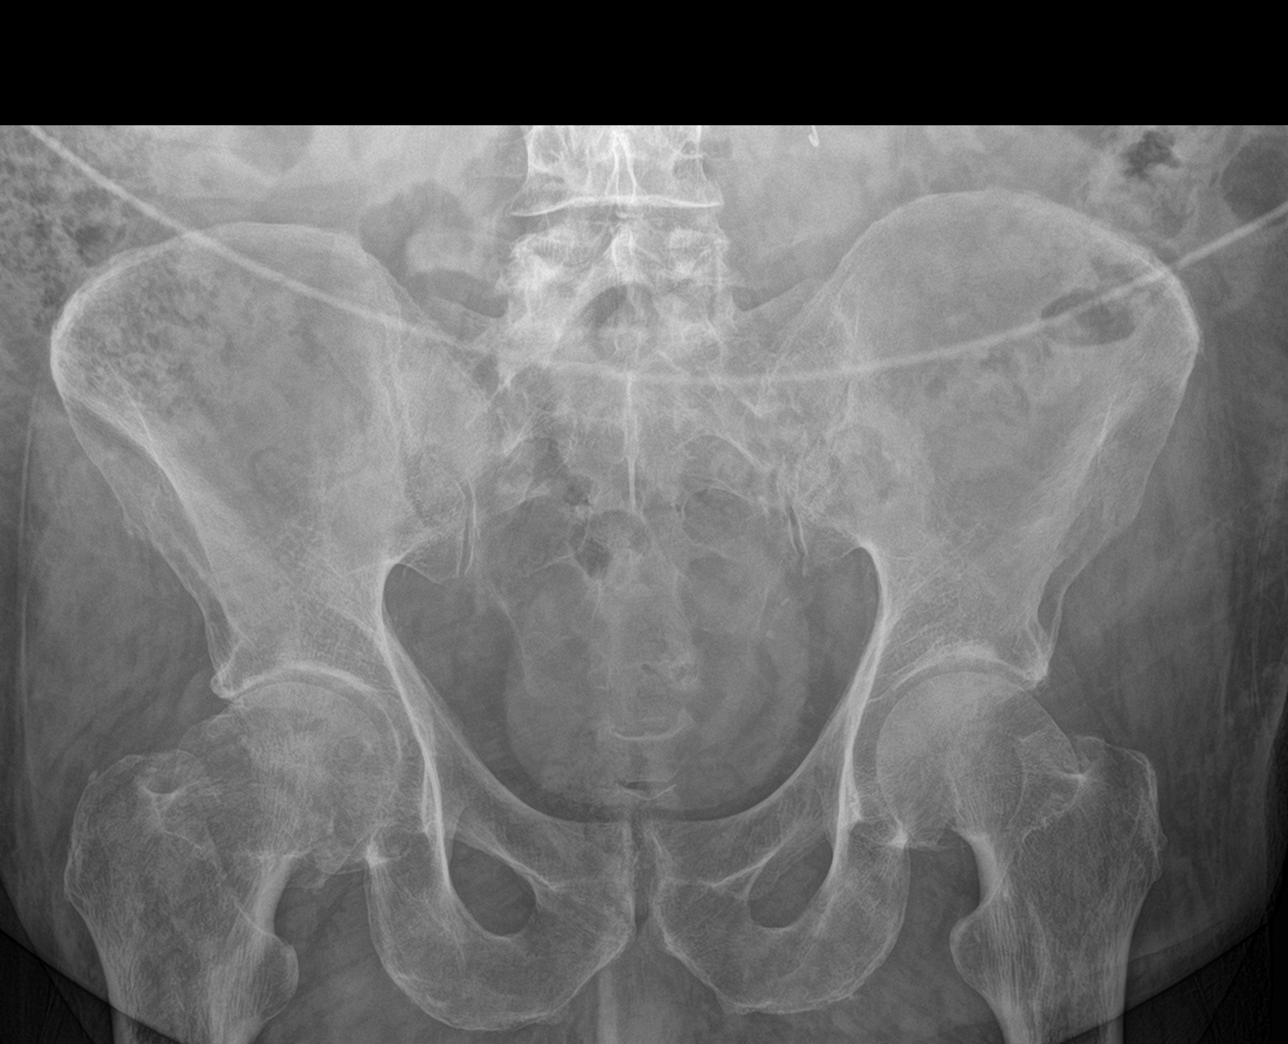

[hip ap]
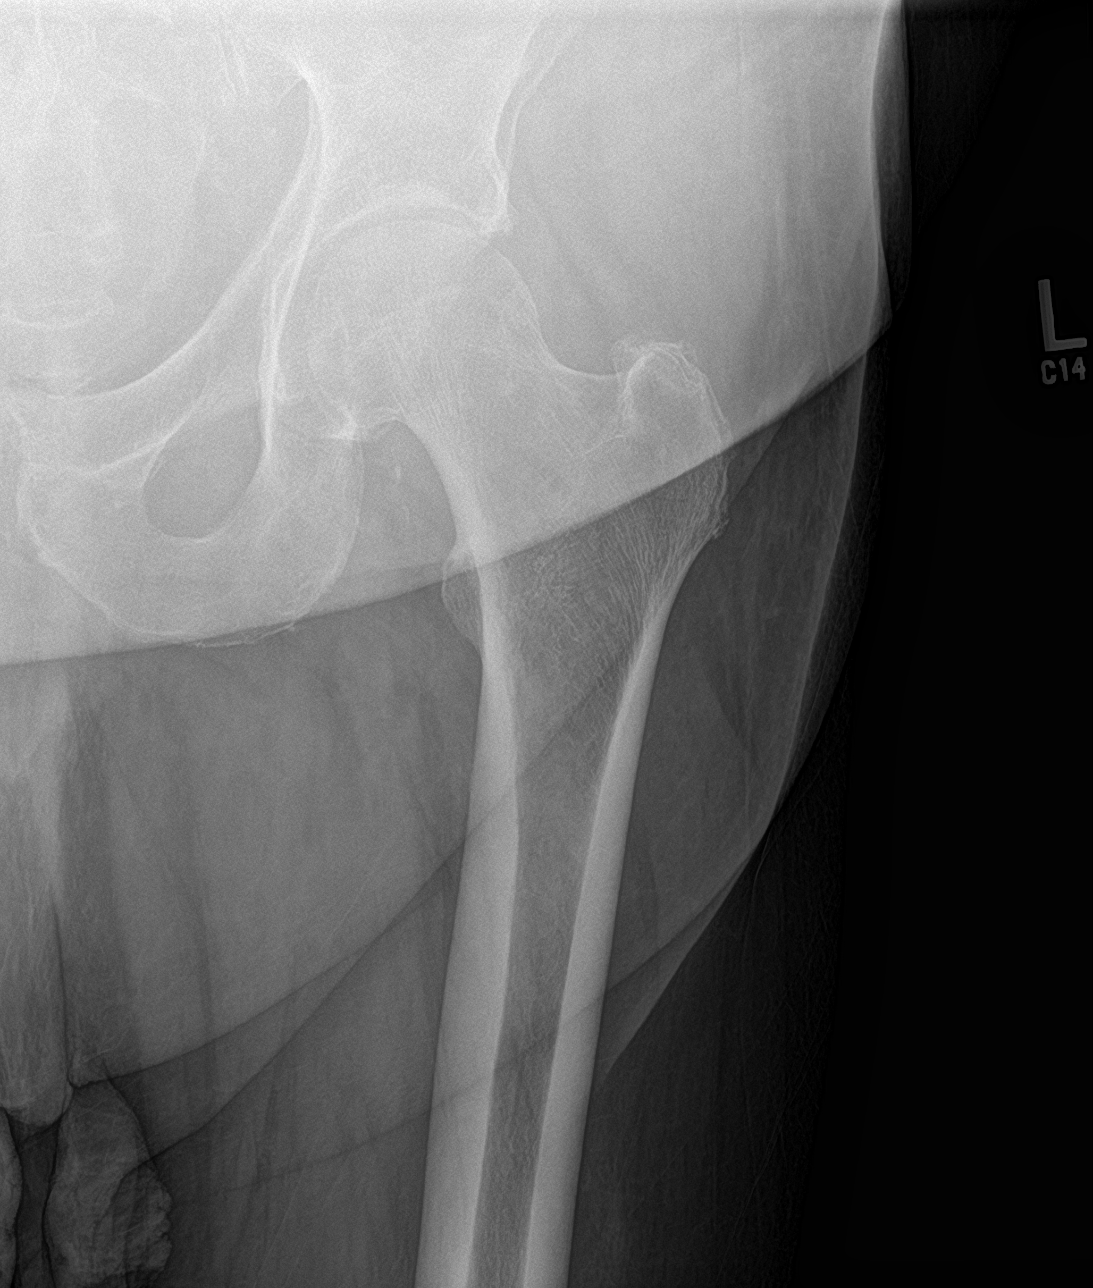

[hip lat]
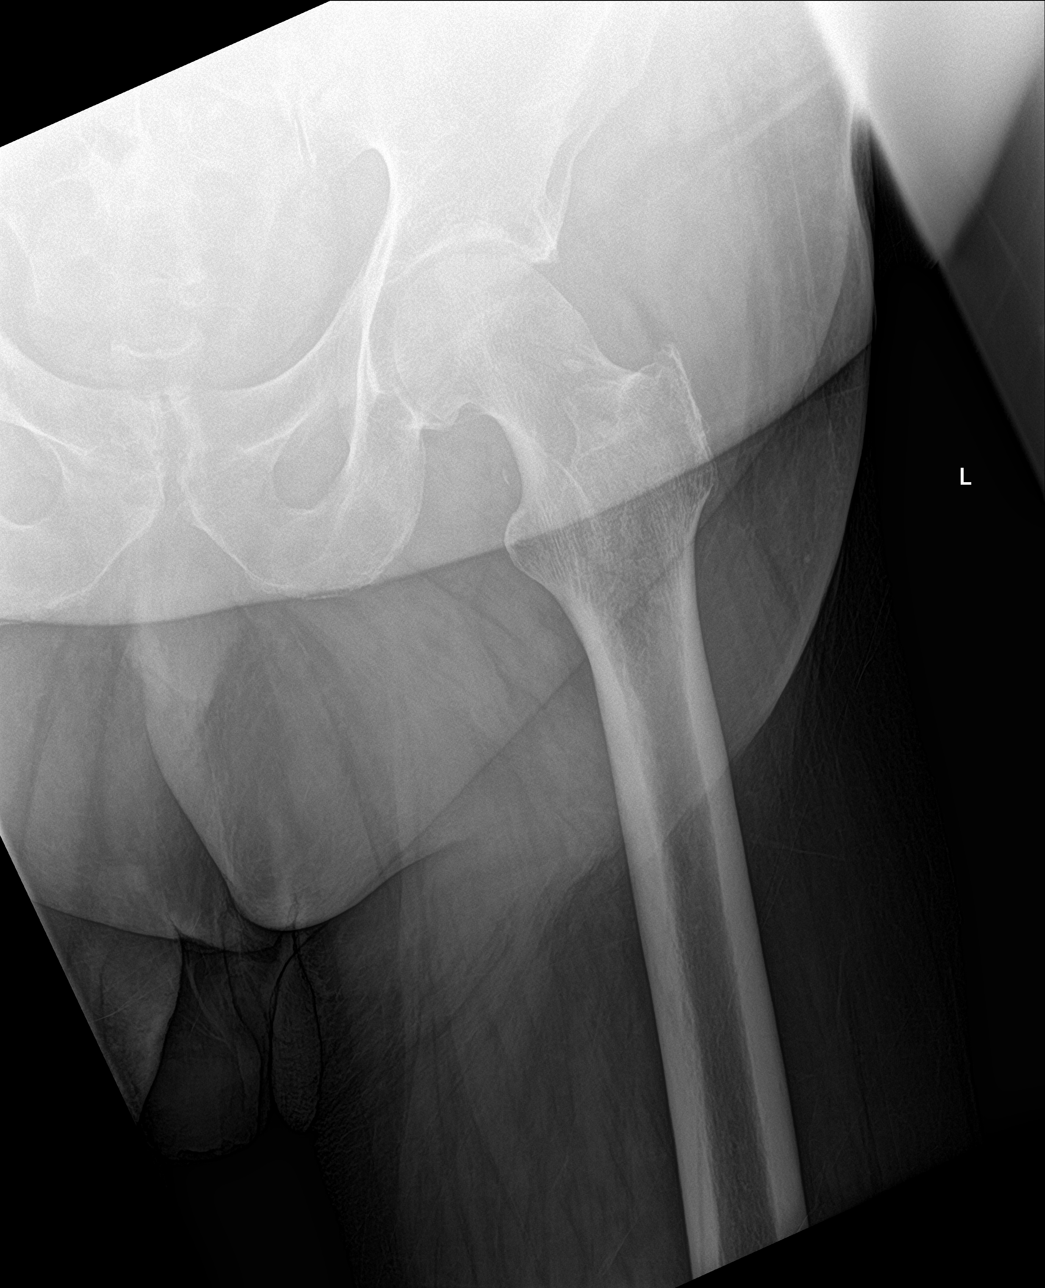

[4 of 4 positions shown; findings below may reference images not displayed]

FINDINGS: Anatomic alignment is maintained. There is no acute fracture. Mild
changes of osteoarthritis.
IMPRESSION: No acute fracture.

## 2020-12-09 IMAGING — CT CT HEAD W/O CM
3 series · 15 of 47 positions shown, 18 images · non-contrast
Comparison: 03/26/2019

CLINICAL DATA: Syncopal episode today with trauma to the right side
of the head

EXAM:
CT HEAD WITHOUT CONTRAST
TECHNIQUE: Contiguous axial images were obtained from the base of the skull
through the vertex without intravenous contrast.

[Series 2: head wo · axial · 0.40mm/px · z∈[+490,+615]mm · 9 of 31 slices shown, 12 images]
[im 3/31  brain]
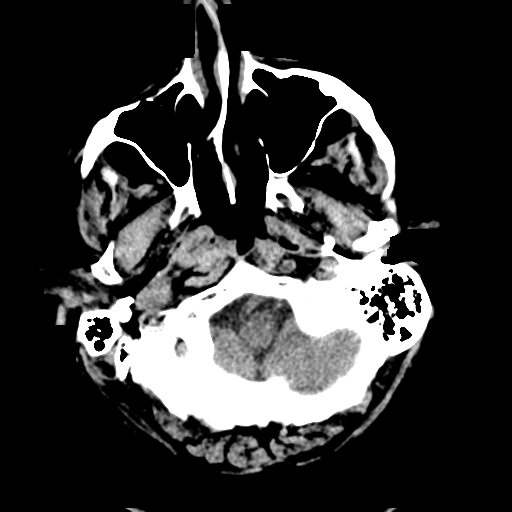
[im 3/31  bone]
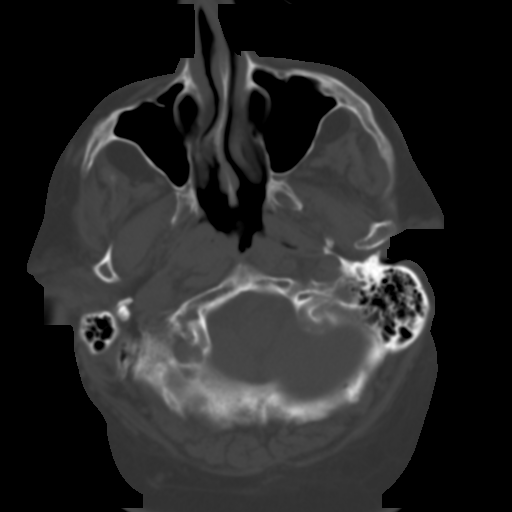
[im 6/31  brain]
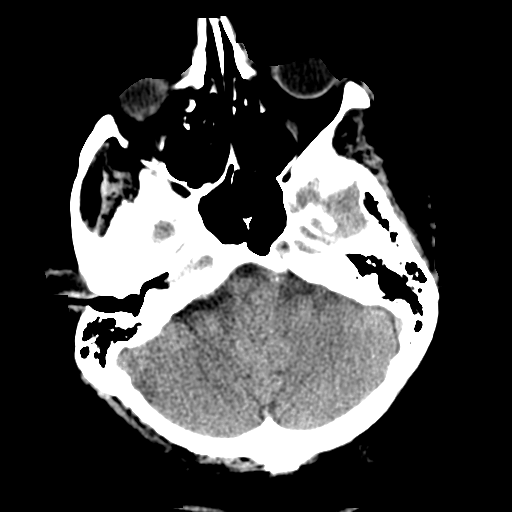
[im 9/31  brain]
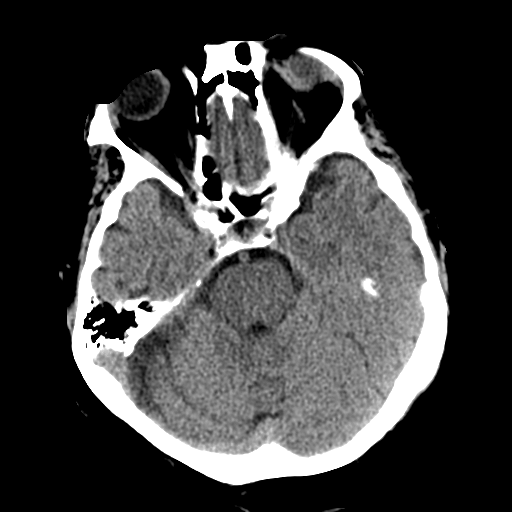
[im 12/31  brain]
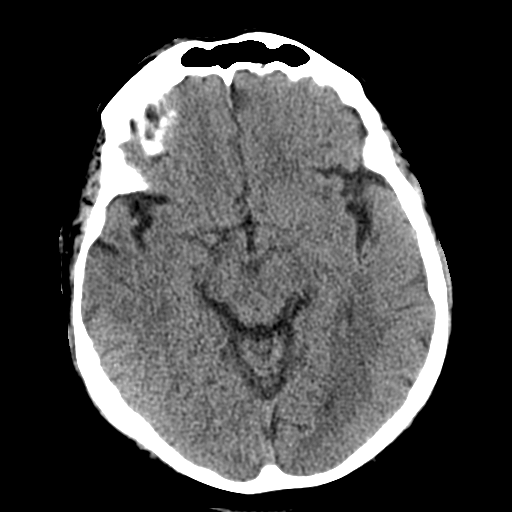
[im 16/31  brain]
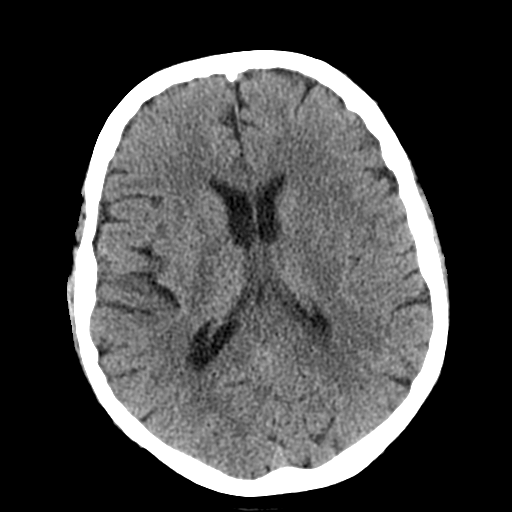
[im 16/31  bone]
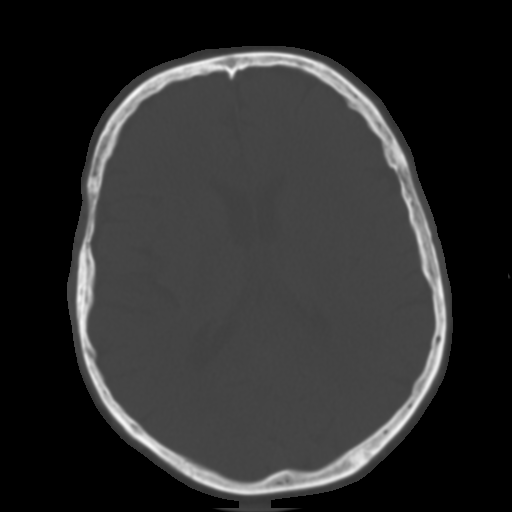
[im 19/31  brain]
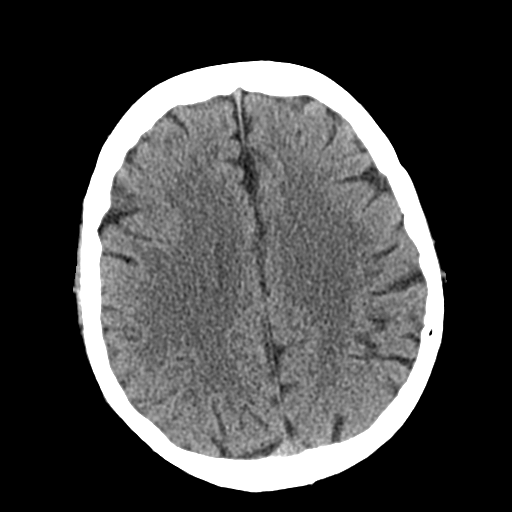
[im 22/31  brain]
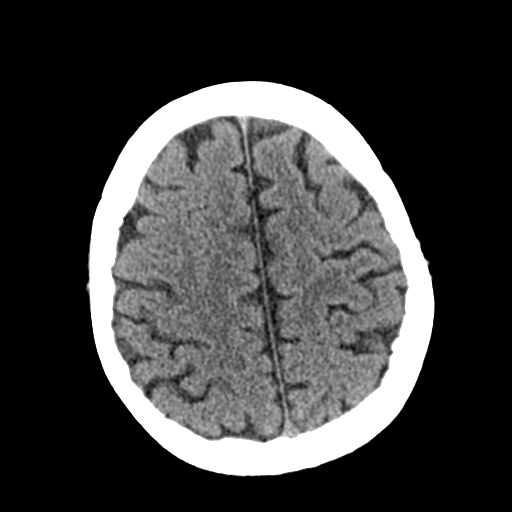
[im 25/31  brain]
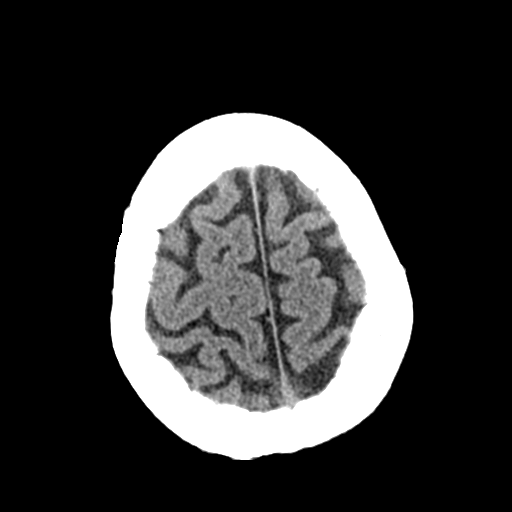
[im 28/31  brain]
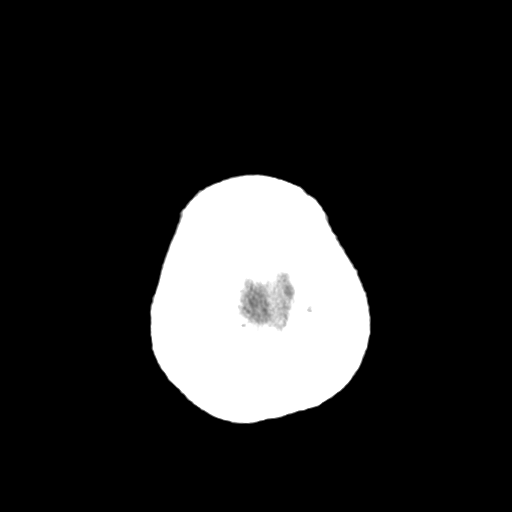
[im 28/31  bone]
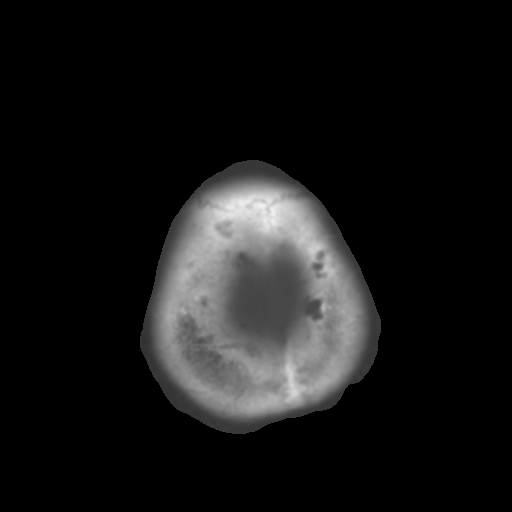

[Series 4: coronal soft tissue · coronal · 0.31mm/px · 3 of 67 slices shown]
[im 23/67  brain]
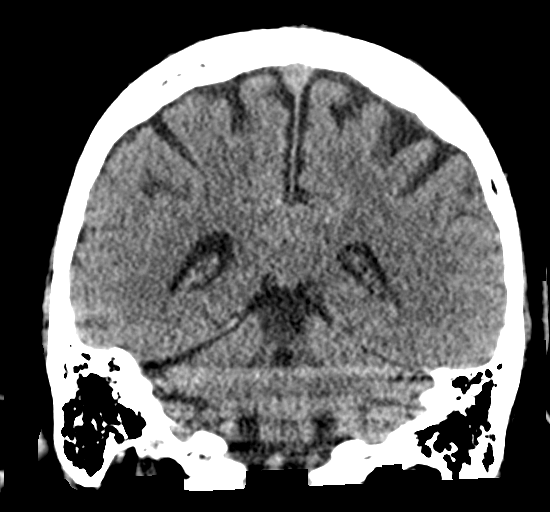
[im 30/67  brain]
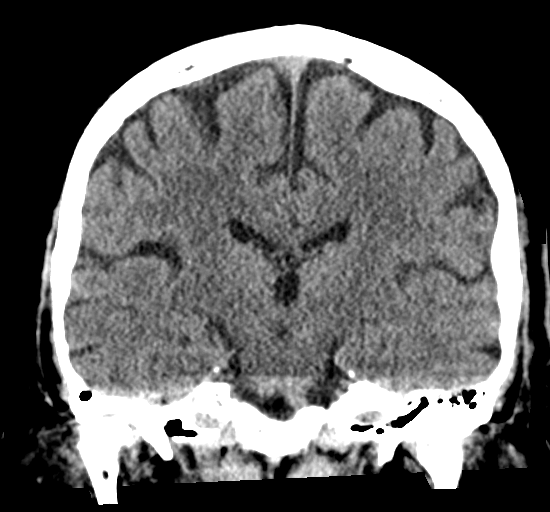
[im 37/67  brain]
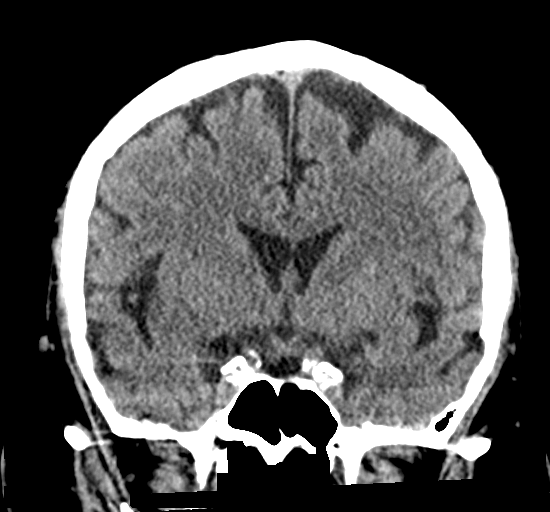

[Series 5: sagittal soft tissue · sagittal · 0.30mm/px · 3 of 66 slices shown]
[im 22/66  brain]
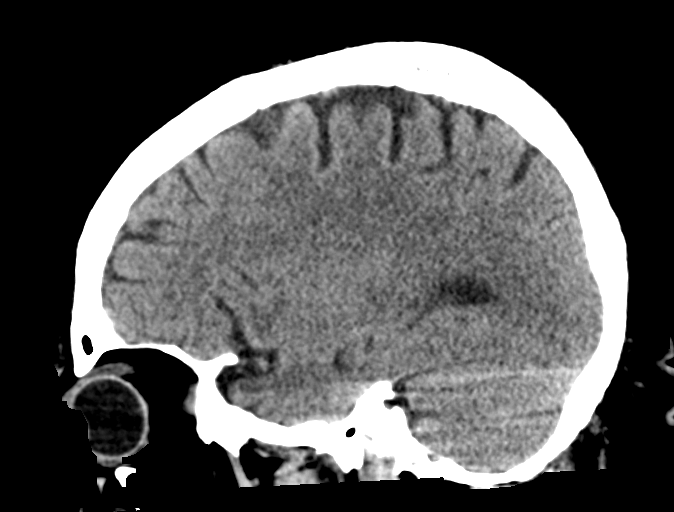
[im 33/66  brain]
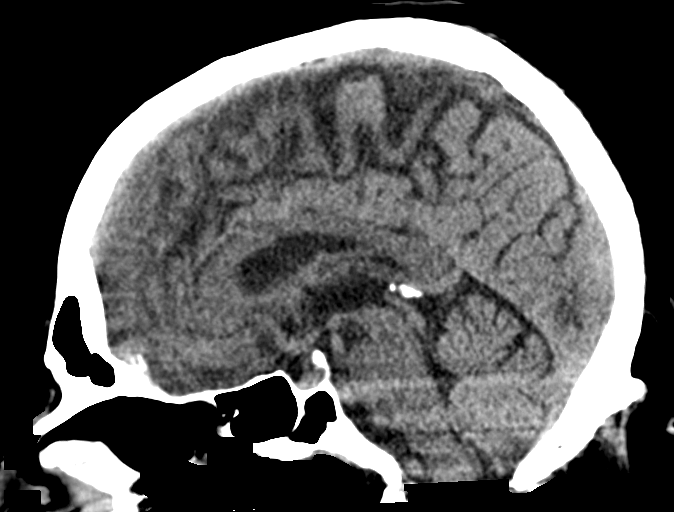
[im 44/66  brain]
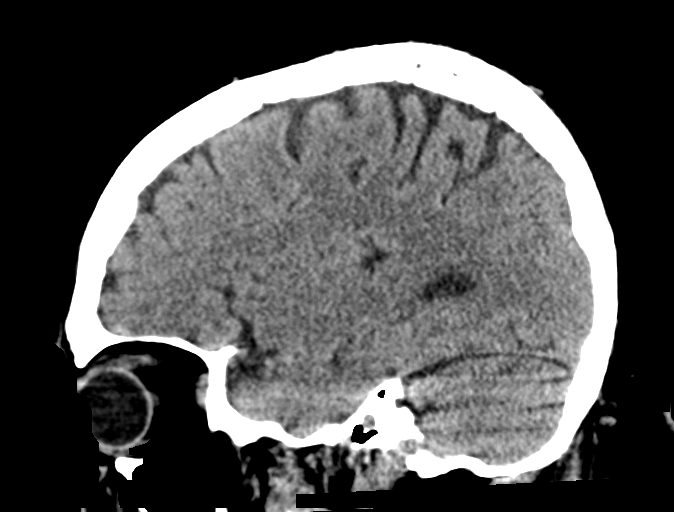

[15 of 47 positions shown; findings below may reference images not displayed]

FINDINGS: Brain: The brain shows a normal appearance without evidence of
malformation, atrophy, old or acute small or large vessel
infarction, mass lesion, hemorrhage, hydrocephalus or extra-axial
collection.

Vascular: No hyperdense vessel. No evidence of atherosclerotic
calcification.

Skull: Normal.  No traumatic finding.  No focal bone lesion.

Sinuses/Orbits: Sinuses are clear. Orbits appear normal. Mastoids
are clear.

Other: None significant
IMPRESSION: Normal head CT

## 2020-12-15 NOTE — Telephone Encounter (Signed)
Attempted to schedule.  

## 2020-12-19 ENCOUNTER — Encounter: Payer: Self-pay | Admitting: Dermatology

## 2020-12-19 IMAGING — CR DG CHEST 2V
1 series · 2 of 2 positions shown · non-contrast
Comparison: 04/24/2019

CLINICAL DATA: Spitting up blood with lower extremity edema and
generalized chest and upper abdominal pain since this morning.

EXAM:
CHEST - 2 VIEW

[Series 1: w chest pa · 0.14mm/px · 2 of 2 slices shown]
[im 1/2]
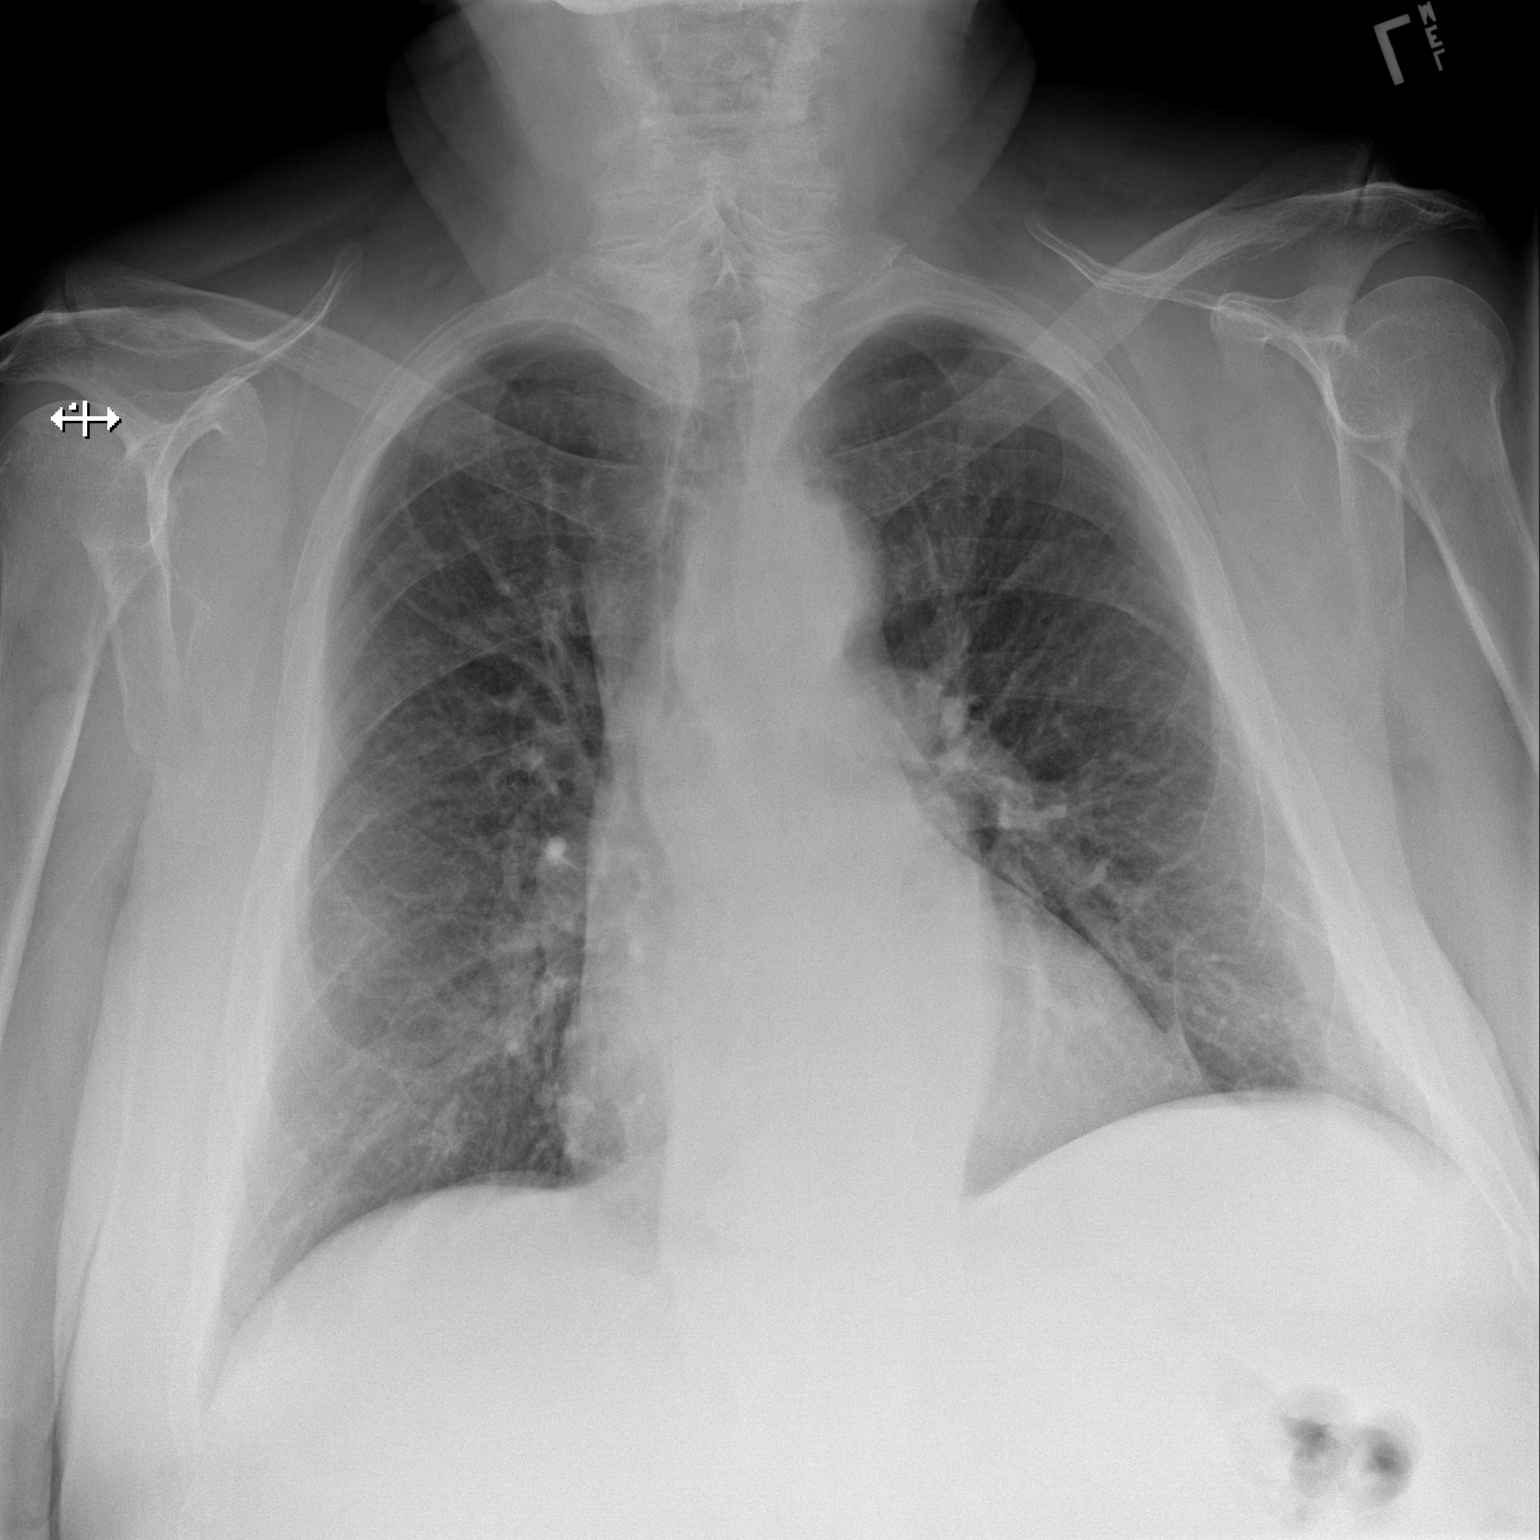
[im 2/2]
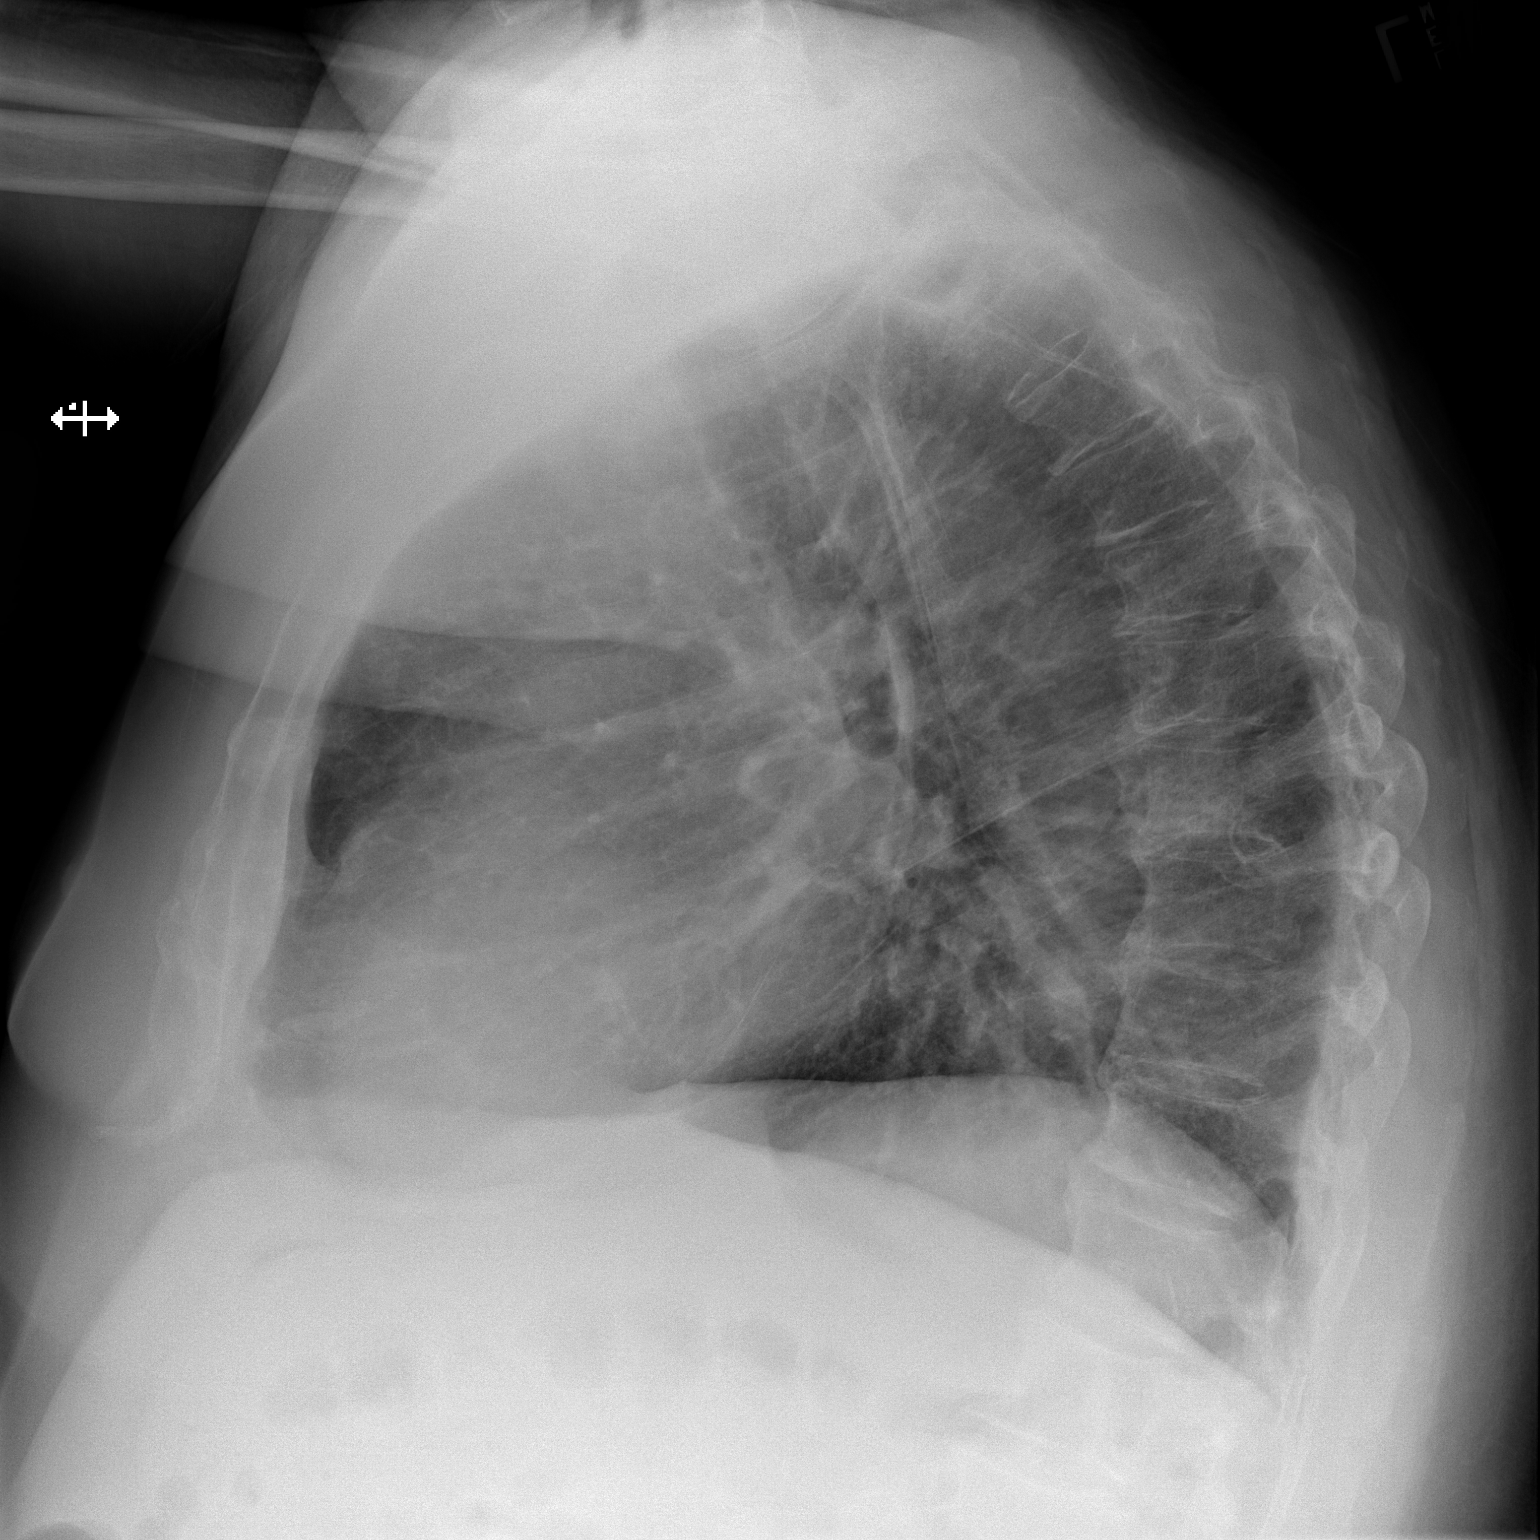

[2 of 2 positions shown; findings below may reference images not displayed]

FINDINGS: Lungs are adequately inflated and otherwise clear. Cardiomediastinal
silhouette and remainder of the exam is unchanged.
IMPRESSION: No active cardiopulmonary disease.

## 2020-12-20 DIAGNOSIS — I5043 Acute on chronic combined systolic (congestive) and diastolic (congestive) heart failure: Secondary | ICD-10-CM | POA: Diagnosis not present

## 2020-12-21 IMAGING — NM NM PULMONARY VENT & PERF
2 series · 16 of 16 positions shown · non-contrast
Comparison: Chest radiograph May 08, 2016

CLINICAL DATA: Shortness of breath and hemoptysis

EXAM:
NUCLEAR MEDICINE VENTILATION - PERFUSION LUNG SCAN
VIEWS:
Anterior, posterior, left lateral, right lateral, RPO, LPO, RAO,
LAO-ventilation and perfusion
RADIOPHARMACEUTICALS:  31.87 mCi of 7c-RRm DTPA aerosol inhalation
and 4.19 mCi 3cNNm MAA IV

[Series 1000: lung ventilation · 3.90mm/px · 4 acquisitions, 8 frames shown]
[im 1/4  full-range]
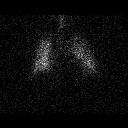
[im 1/4  full-range]
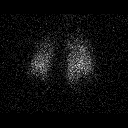
[im 2/4  full-range]
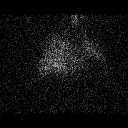
[im 2/4  full-range]
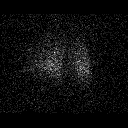
[im 3/4  full-range]
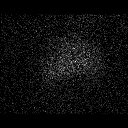
[im 3/4  full-range]
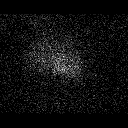
[im 4/4  full-range]
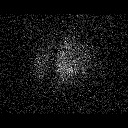
[im 4/4  full-range]
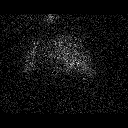

[Series 1000: lung perfusion · 1.95mm/px · 4 acquisitions, 8 frames shown]
[im 1/4]
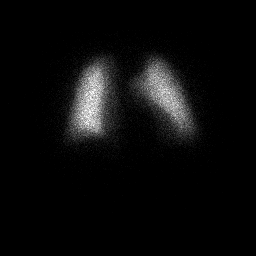
[im 1/4]
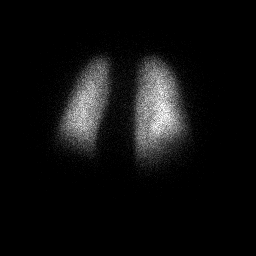
[im 2/4  full-range]
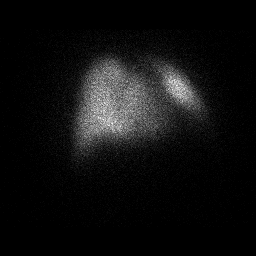
[im 2/4  full-range]
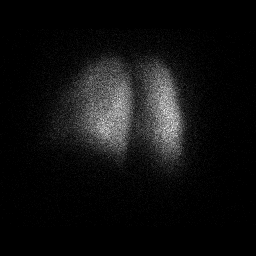
[im 3/4  full-range]
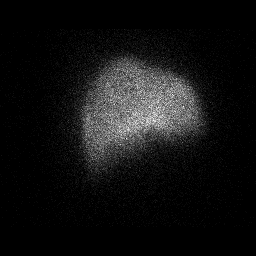
[im 3/4  full-range]
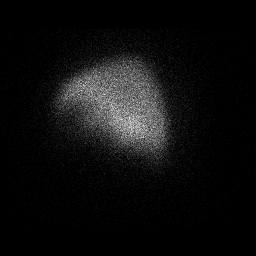
[im 4/4  full-range]
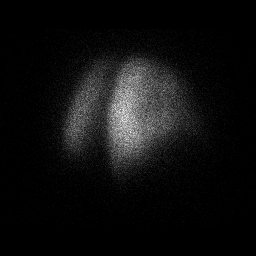
[im 4/4  full-range]
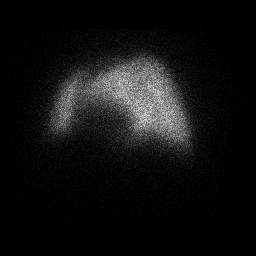

[16 of 16 positions shown; findings below may reference images not displayed]

FINDINGS: Ventilation: Radiotracer uptake is homogeneous and symmetric
bilaterally. No focal ventilation defects are evident.

Perfusion: Radiotracer uptake is homogeneous and symmetric
bilaterally. No perfusion defects evident.
IMPRESSION: No appreciable ventilation or perfusion defects. Very low
probability of pulmonary embolus.

## 2020-12-29 IMAGING — CR DG LUMBAR SPINE 2-3V
3 series · 3 of 3 positions shown · non-contrast
Comparison: 06/26/2010

CLINICAL DATA: Low back pain status post recent fall.

EXAM:
LUMBAR SPINE - 2-3 VIEW

[l-spine ap]
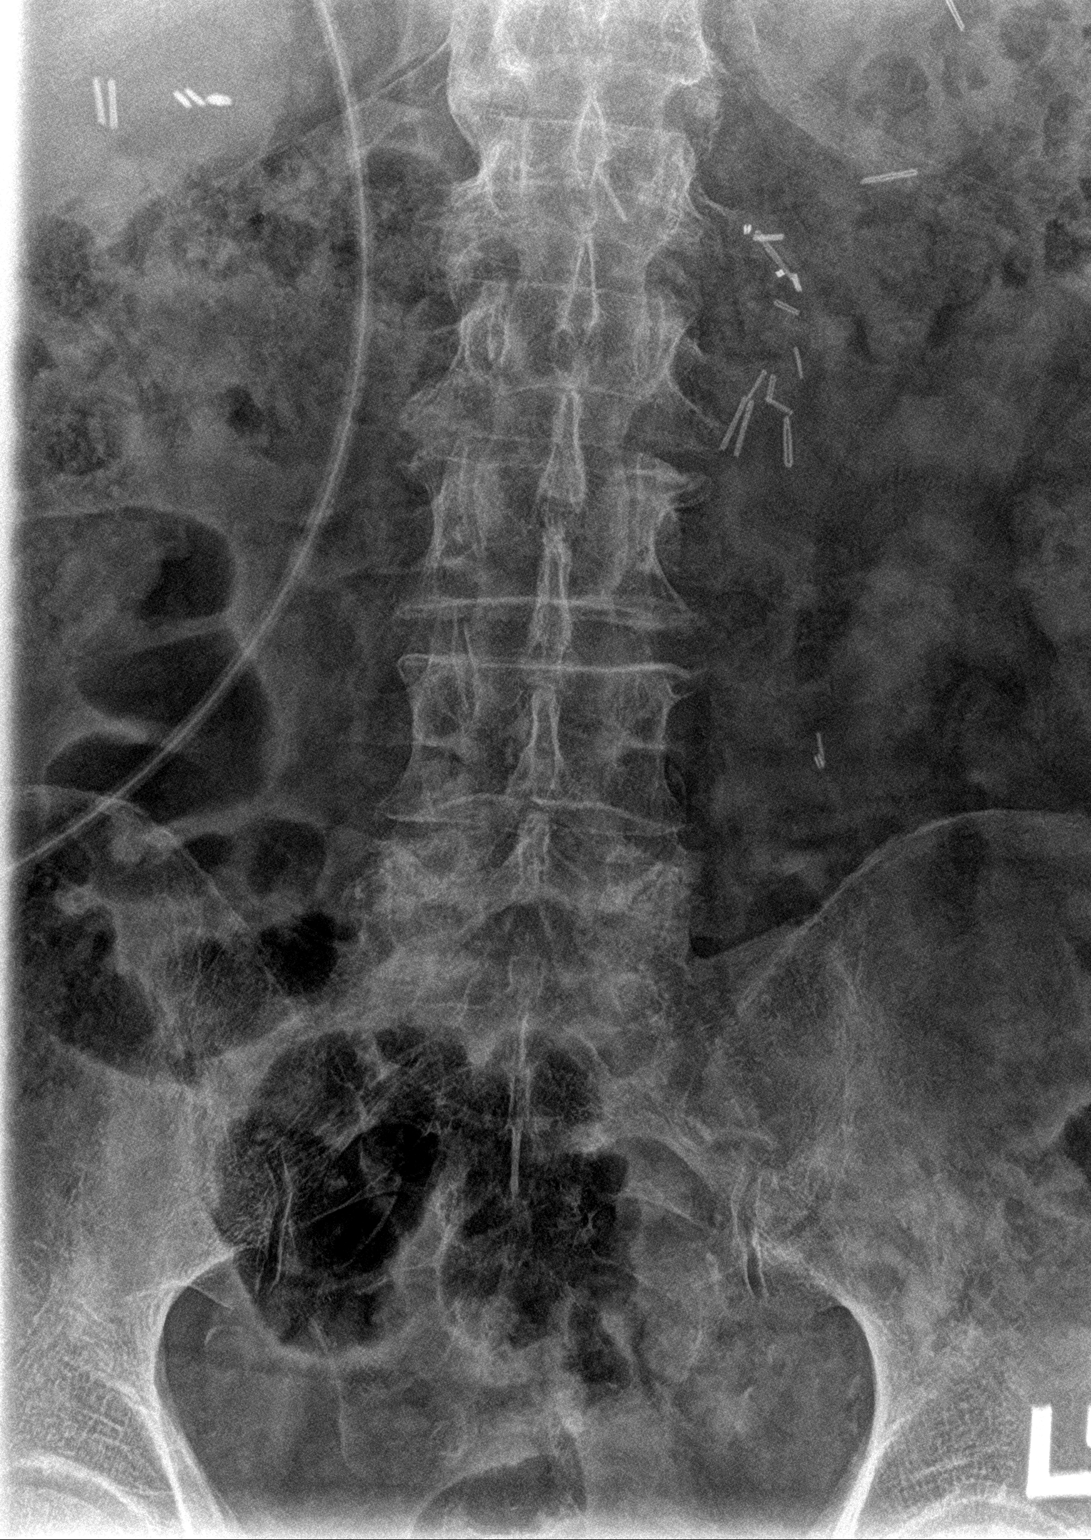

[l-spine lat]
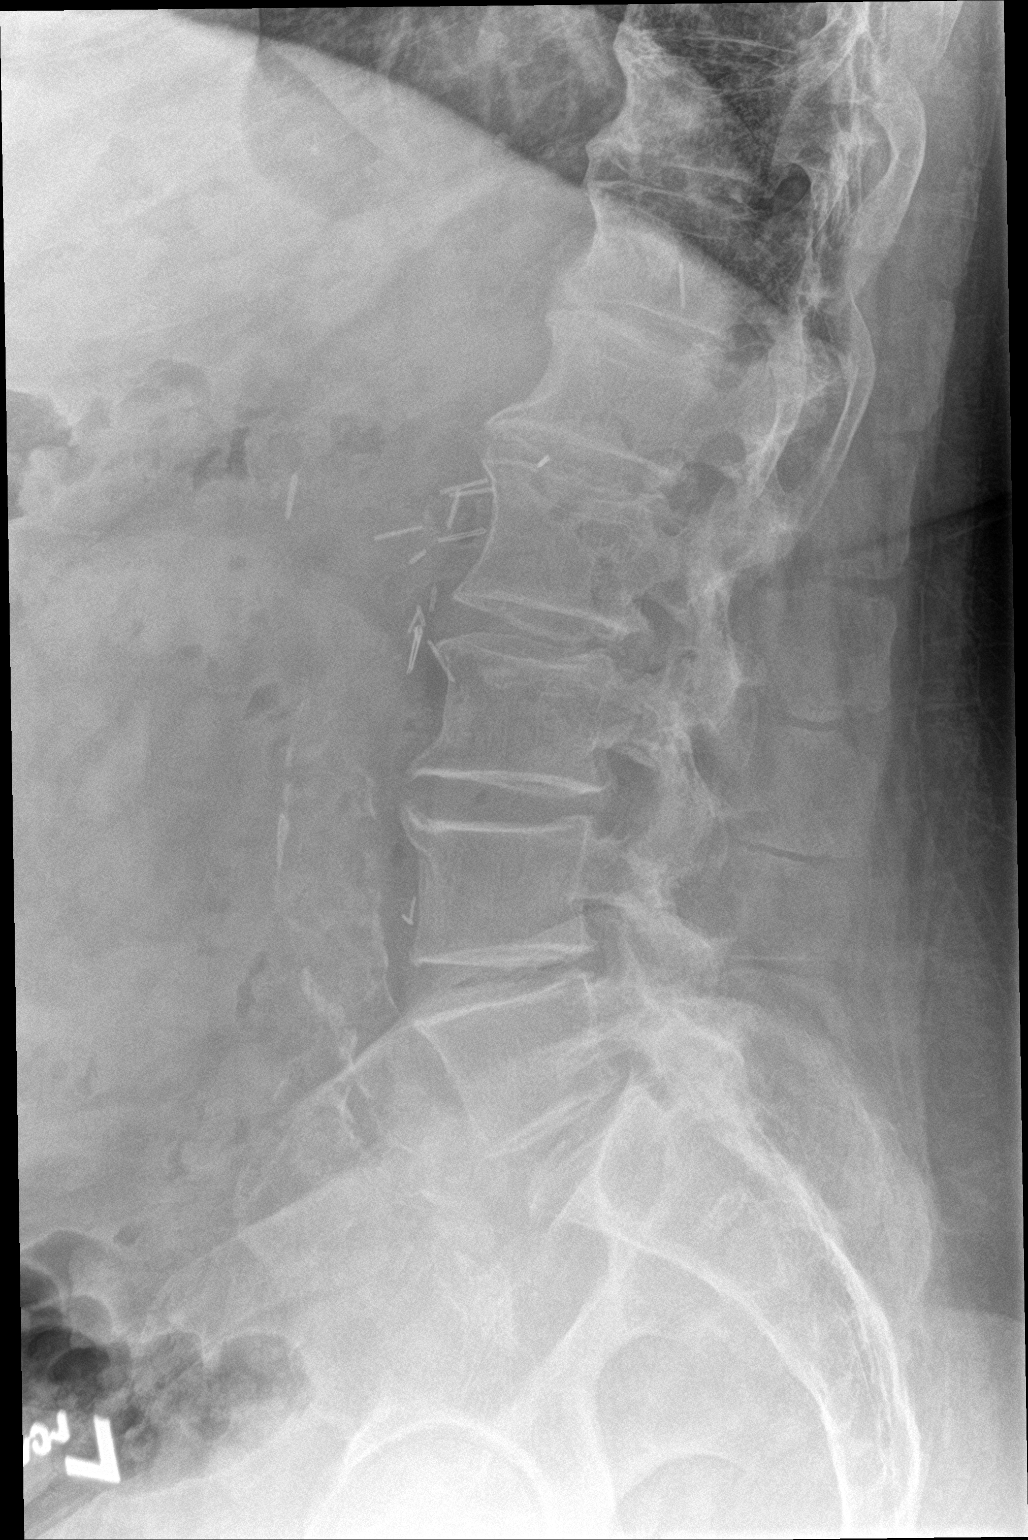

[l-spine spot]
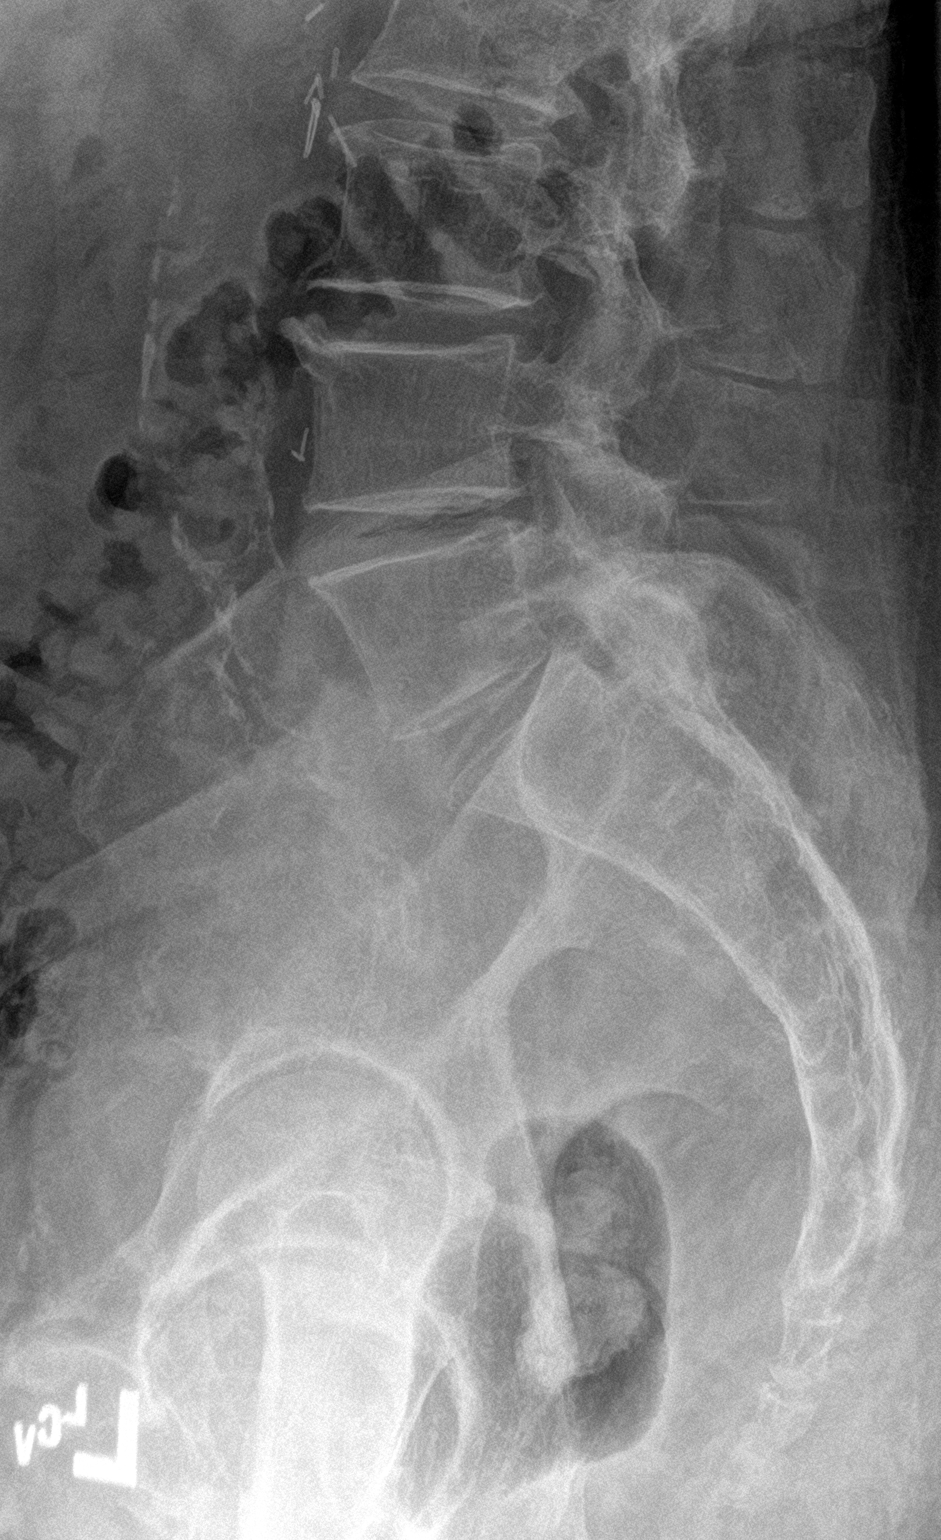

[3 of 3 positions shown; findings below may reference images not displayed]

FINDINGS: There is no acute osseous abnormality. Multilevel advanced
degenerative changes are noted throughout the visualized portions of
the thoracolumbar spine. Facet arthrosis is noted at the lower
lumbar segments. Vascular calcifications are noted. Multiple
surgical clips project over the patient's abdomen.
IMPRESSION: 1. No acute osseous abnormality.
2. Advanced multilevel degenerative changes are noted throughout the
thoracolumbar spine.

## 2020-12-29 IMAGING — CR DG HIP (WITH OR WITHOUT PELVIS) 2-3V*R*
3 series · 4 of 4 positions shown · non-contrast
Comparison: 04/29/2019

CLINICAL DATA: Low back and hip pain after falling

EXAM:
DG HIP (WITH OR WITHOUT PELVIS) 2-3V RIGHT

[Series 1: pelvis ap · 0.14mm/px · 2 of 2 slices shown]
[im 1/2]
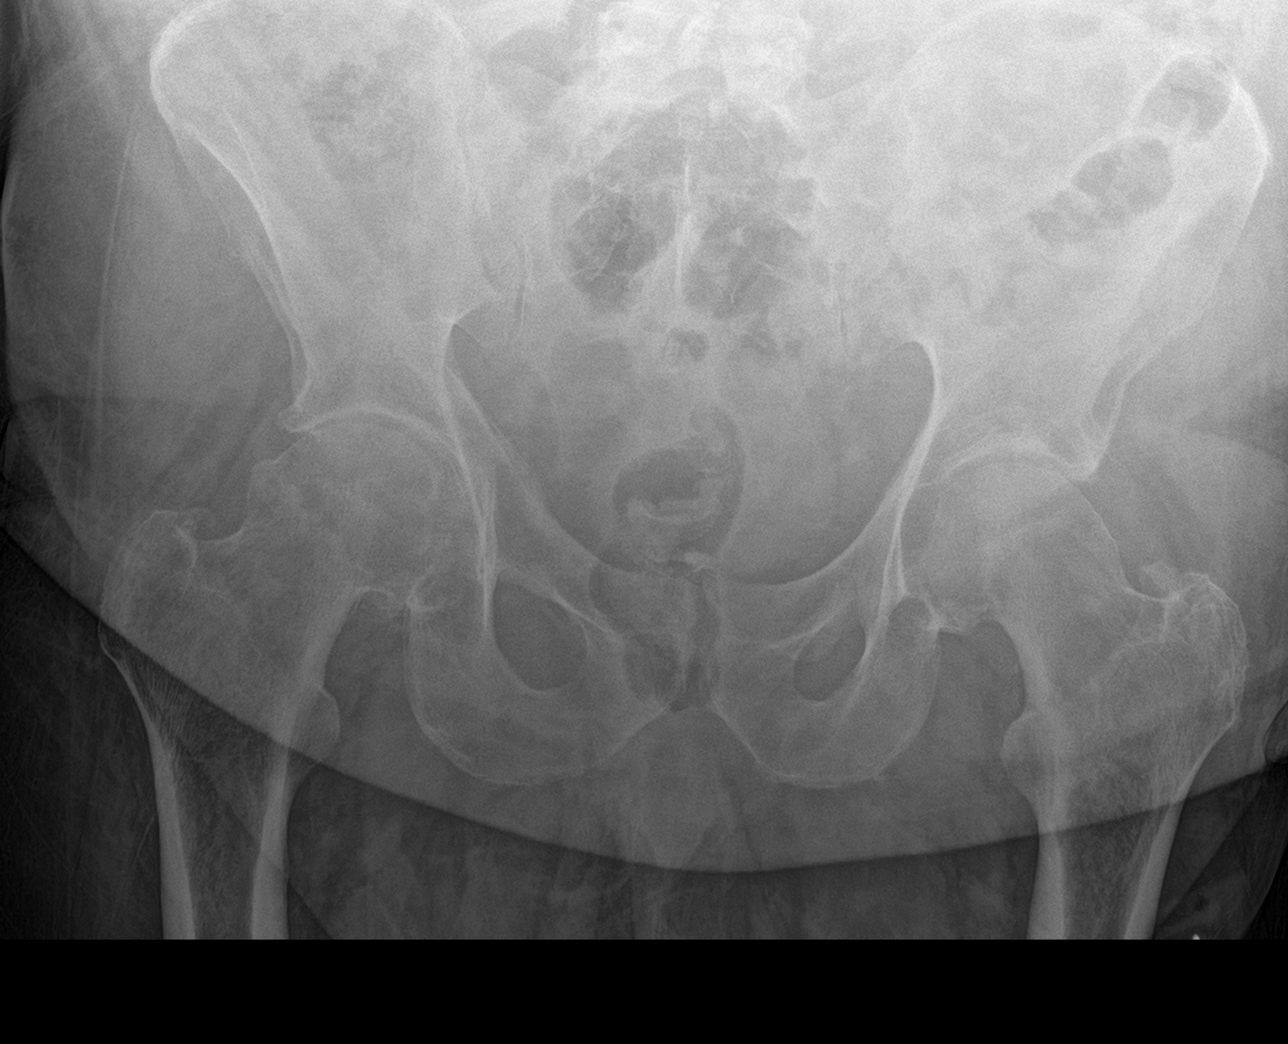
[im 2/2]
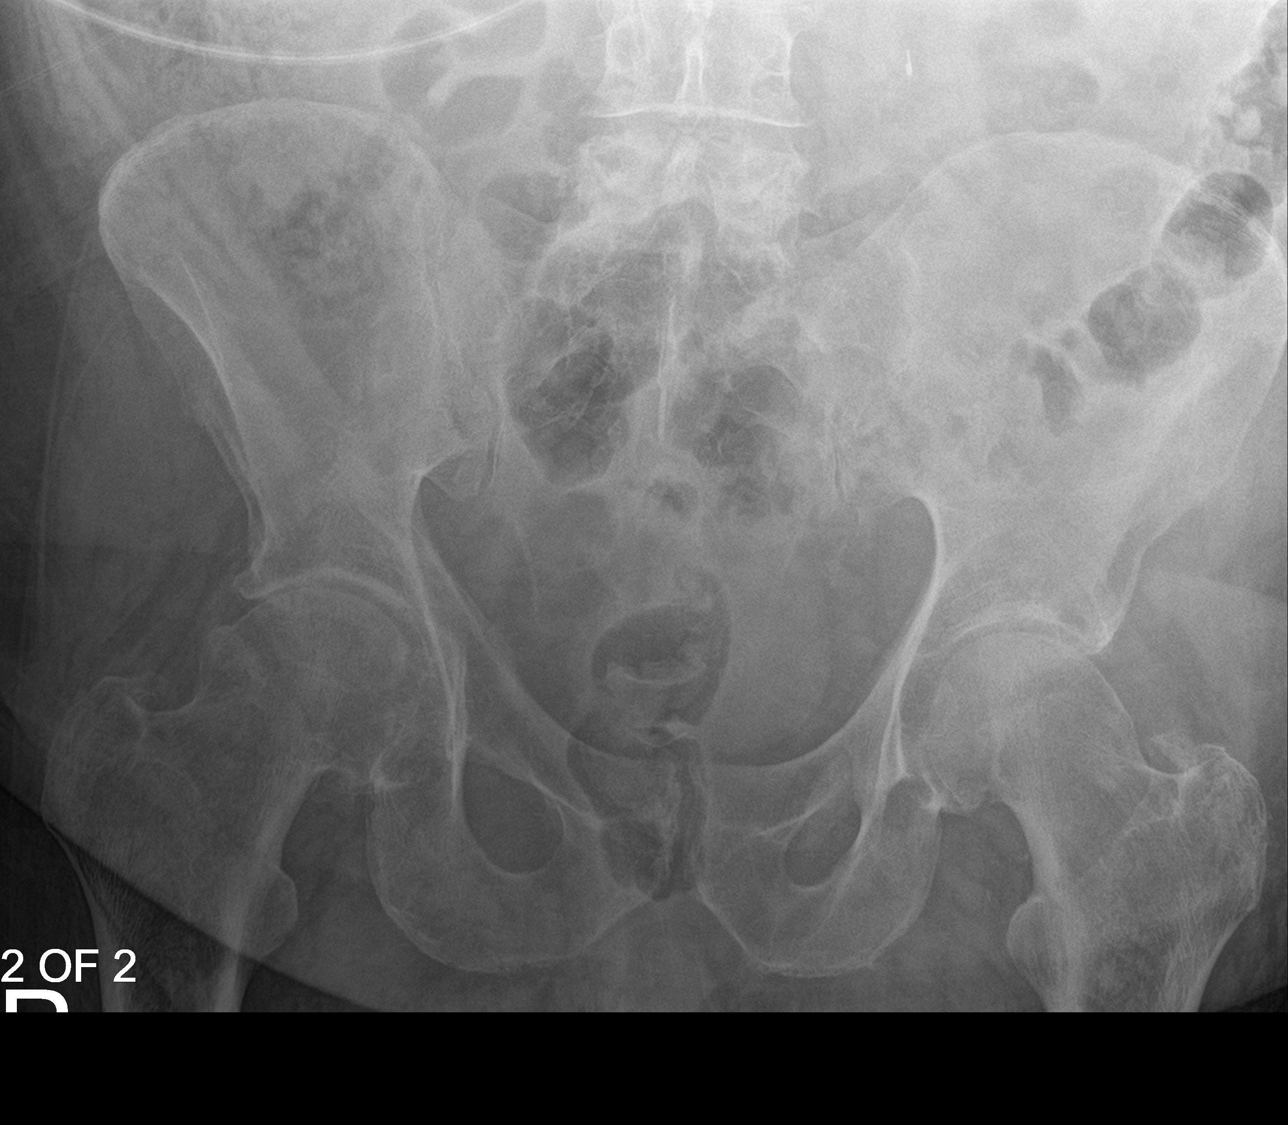

[hip ap]
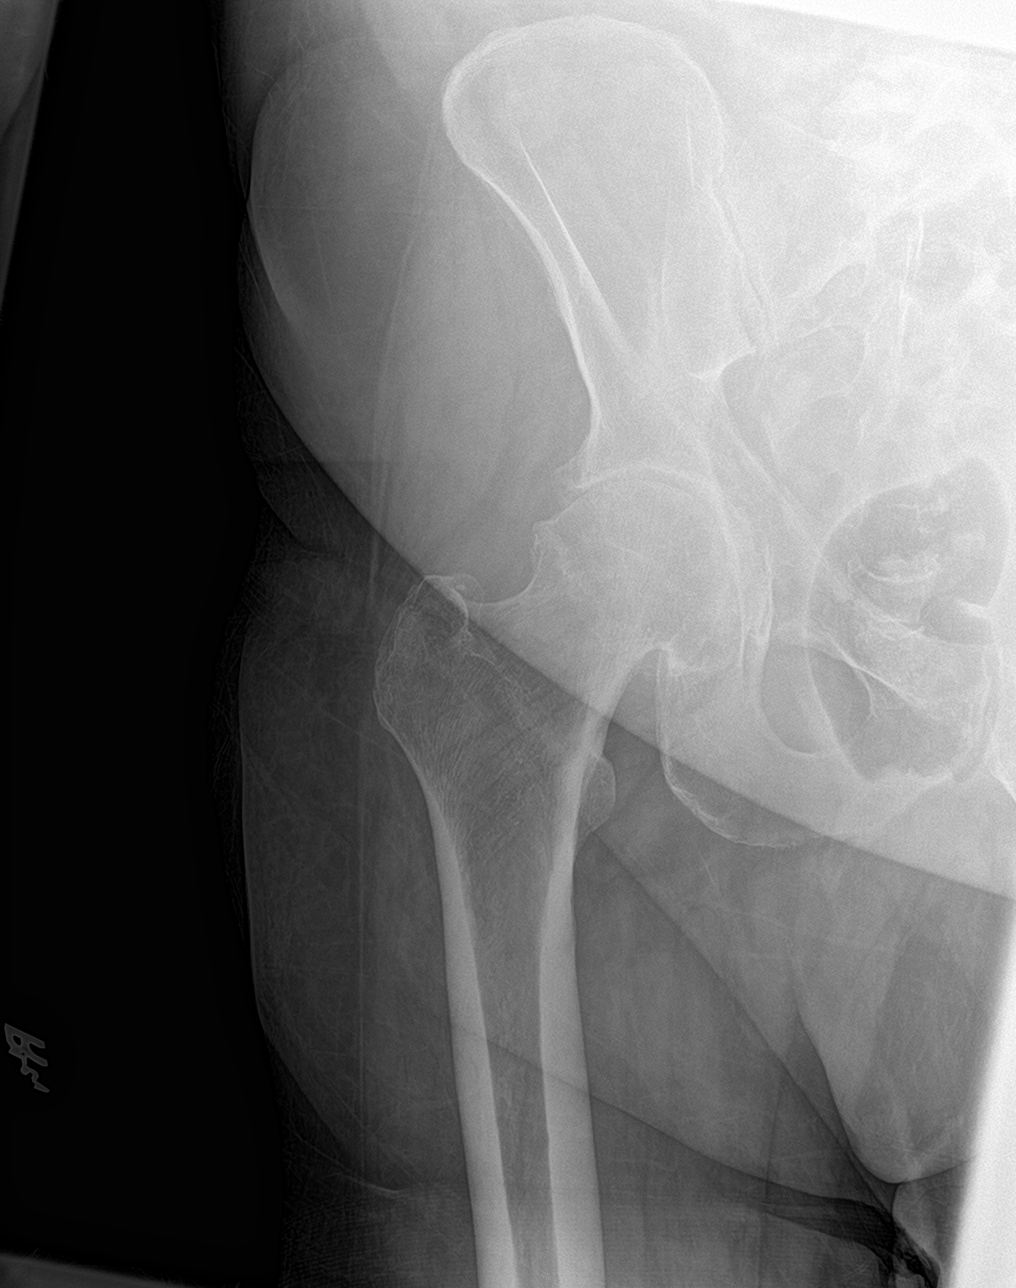

[hip lat]
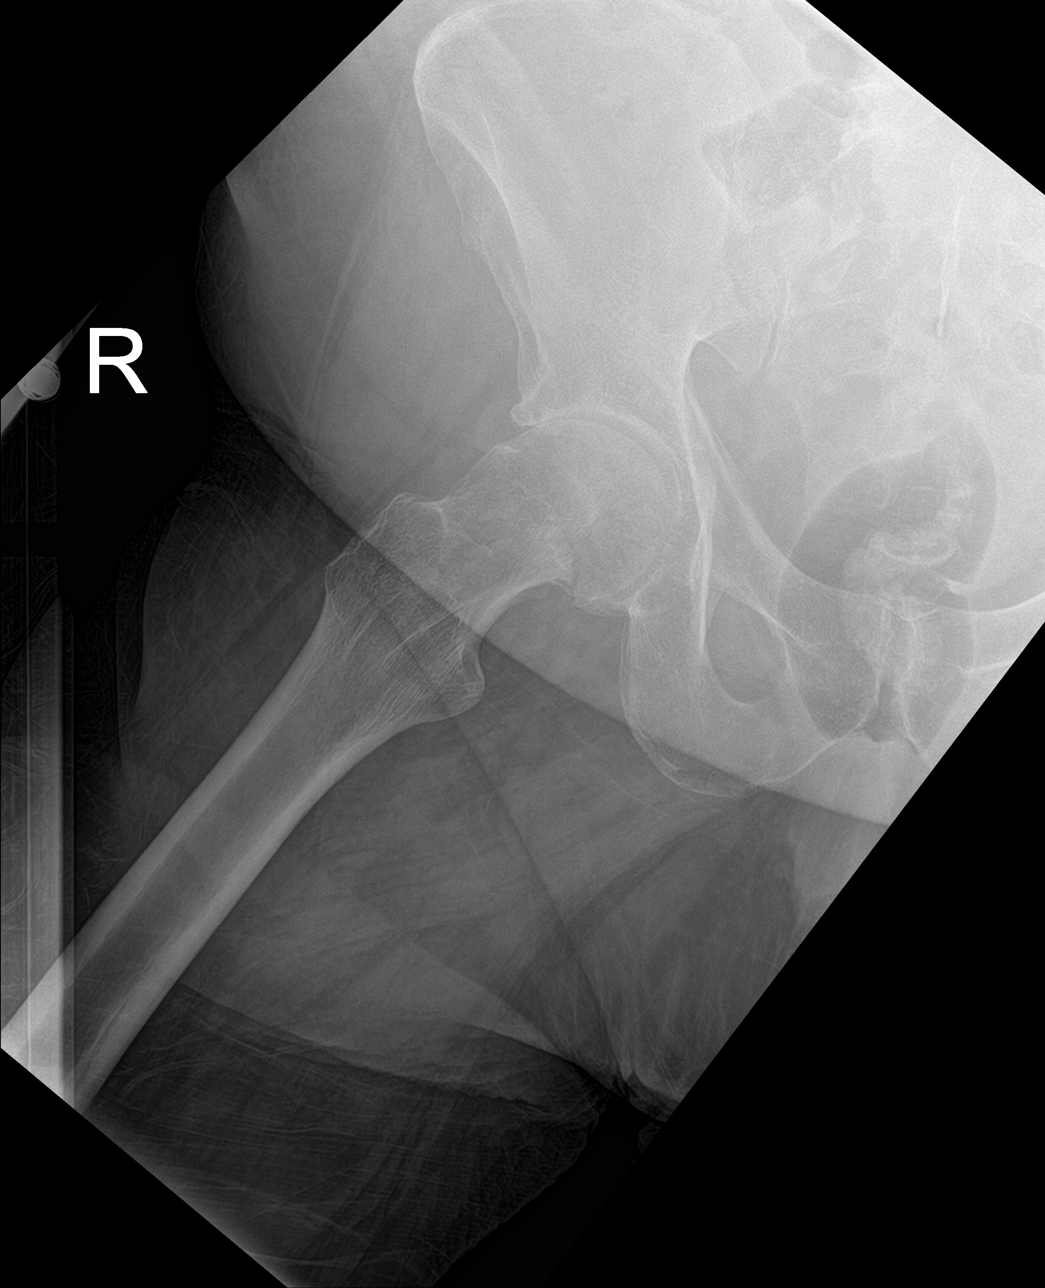

[4 of 4 positions shown; findings below may reference images not displayed]

FINDINGS: Osseous demineralization.

Degenerative changes of the hip joints bilaterally with joint space
narrowing and spur formation greater on RIGHT.

SI joints preserved.

No definite fracture, dislocation or bone destruction.

A lucency traversing the subcapital region of the RIGHT femoral neck
on the frog-leg lateral view is unchanged since the previous exam
and suspect is related to superimposition of the acetabulum.
IMPRESSION: No acute osseous abnormalities.

Osseous demineralization with degenerative changes of the hip joints
bilaterally greater on RIGHT.

## 2020-12-29 IMAGING — CR DG CHEST 2V
1 series · 2 of 2 positions shown · non-contrast
Comparison: Chest x-ray 05/16/2019 and chest CT 05/09/2019

CLINICAL DATA: Chest pain.

EXAM:
CHEST - 2 VIEW

[Series 1: dg chest 2 view · 0.14mm/px · 2 of 2 slices shown]
[im 1/2]
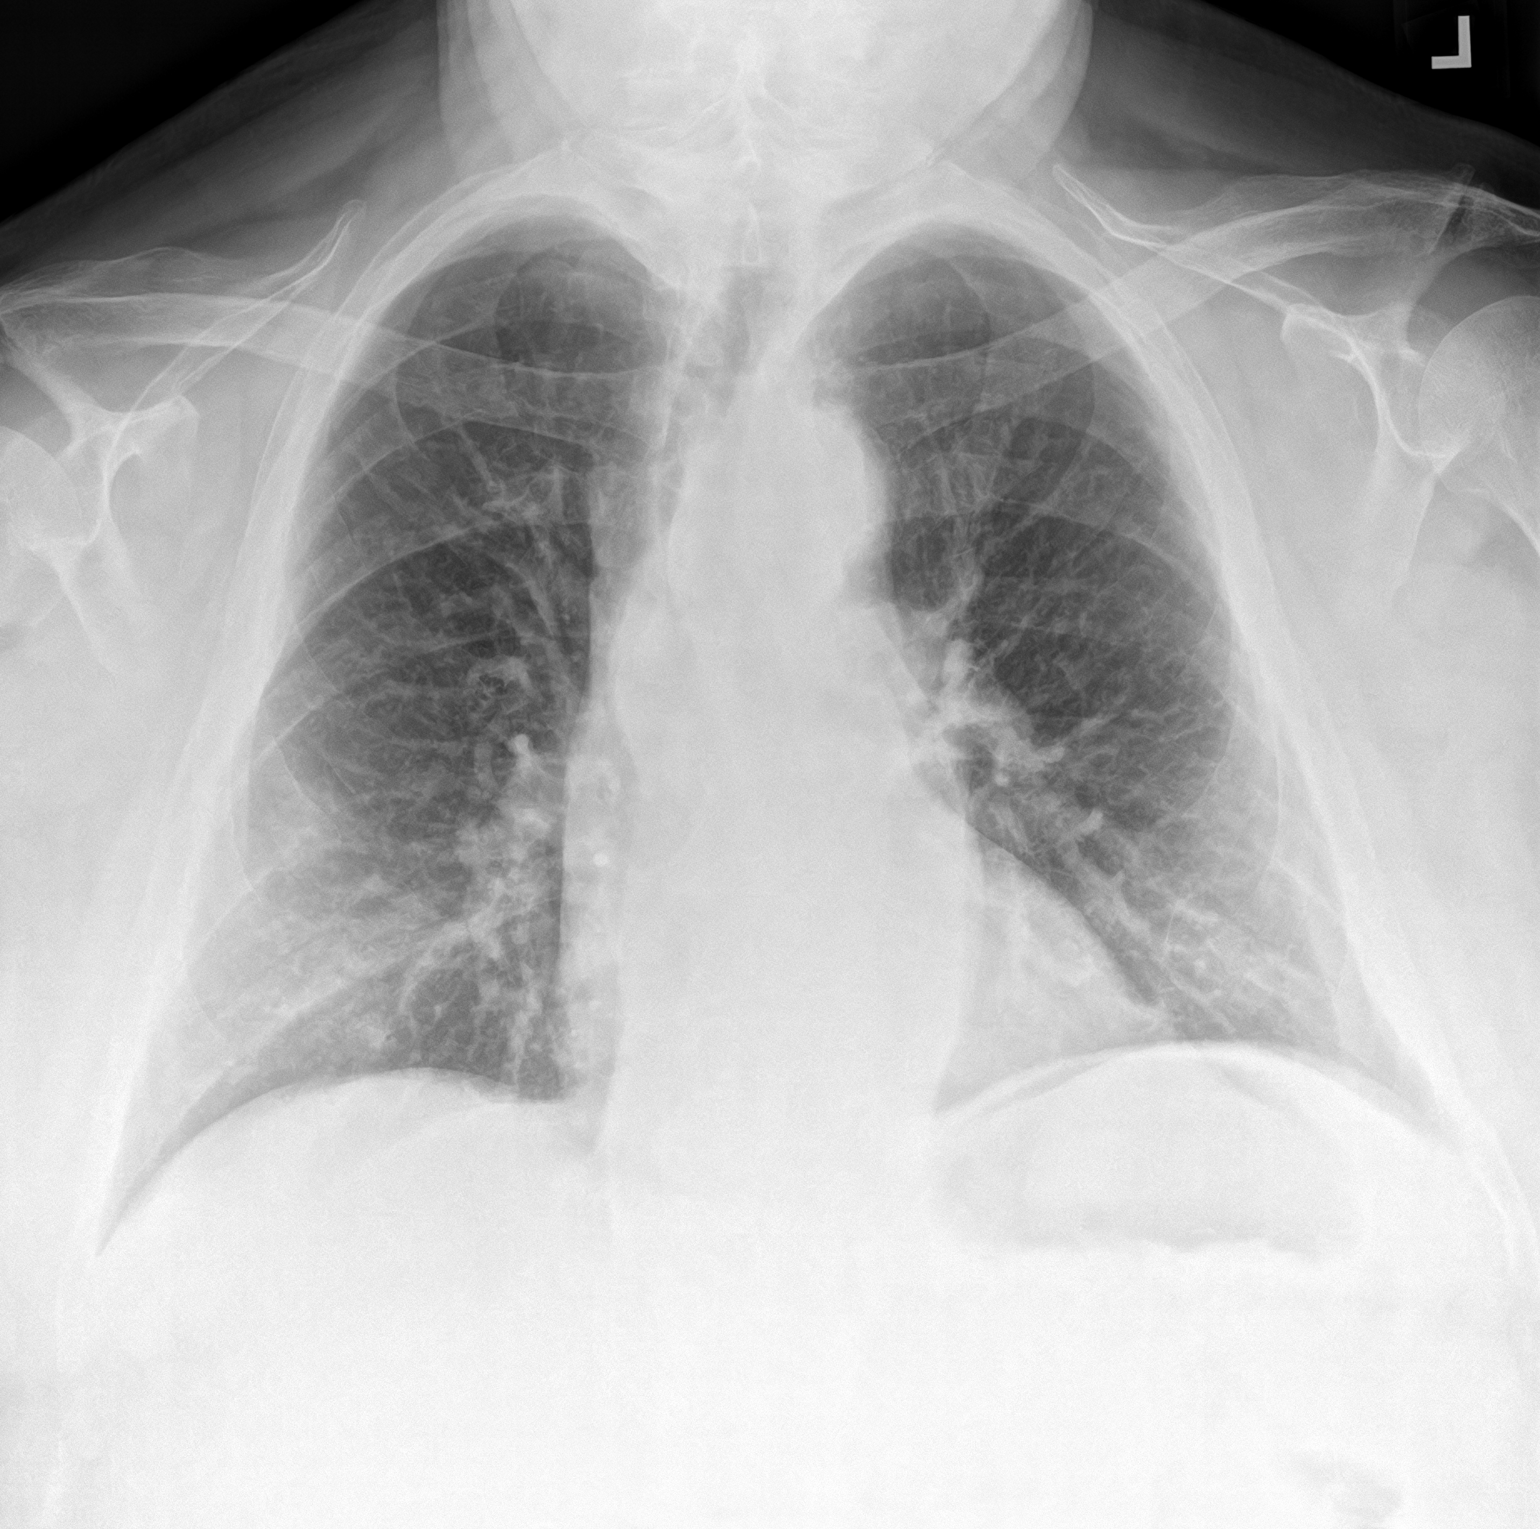
[im 2/2]
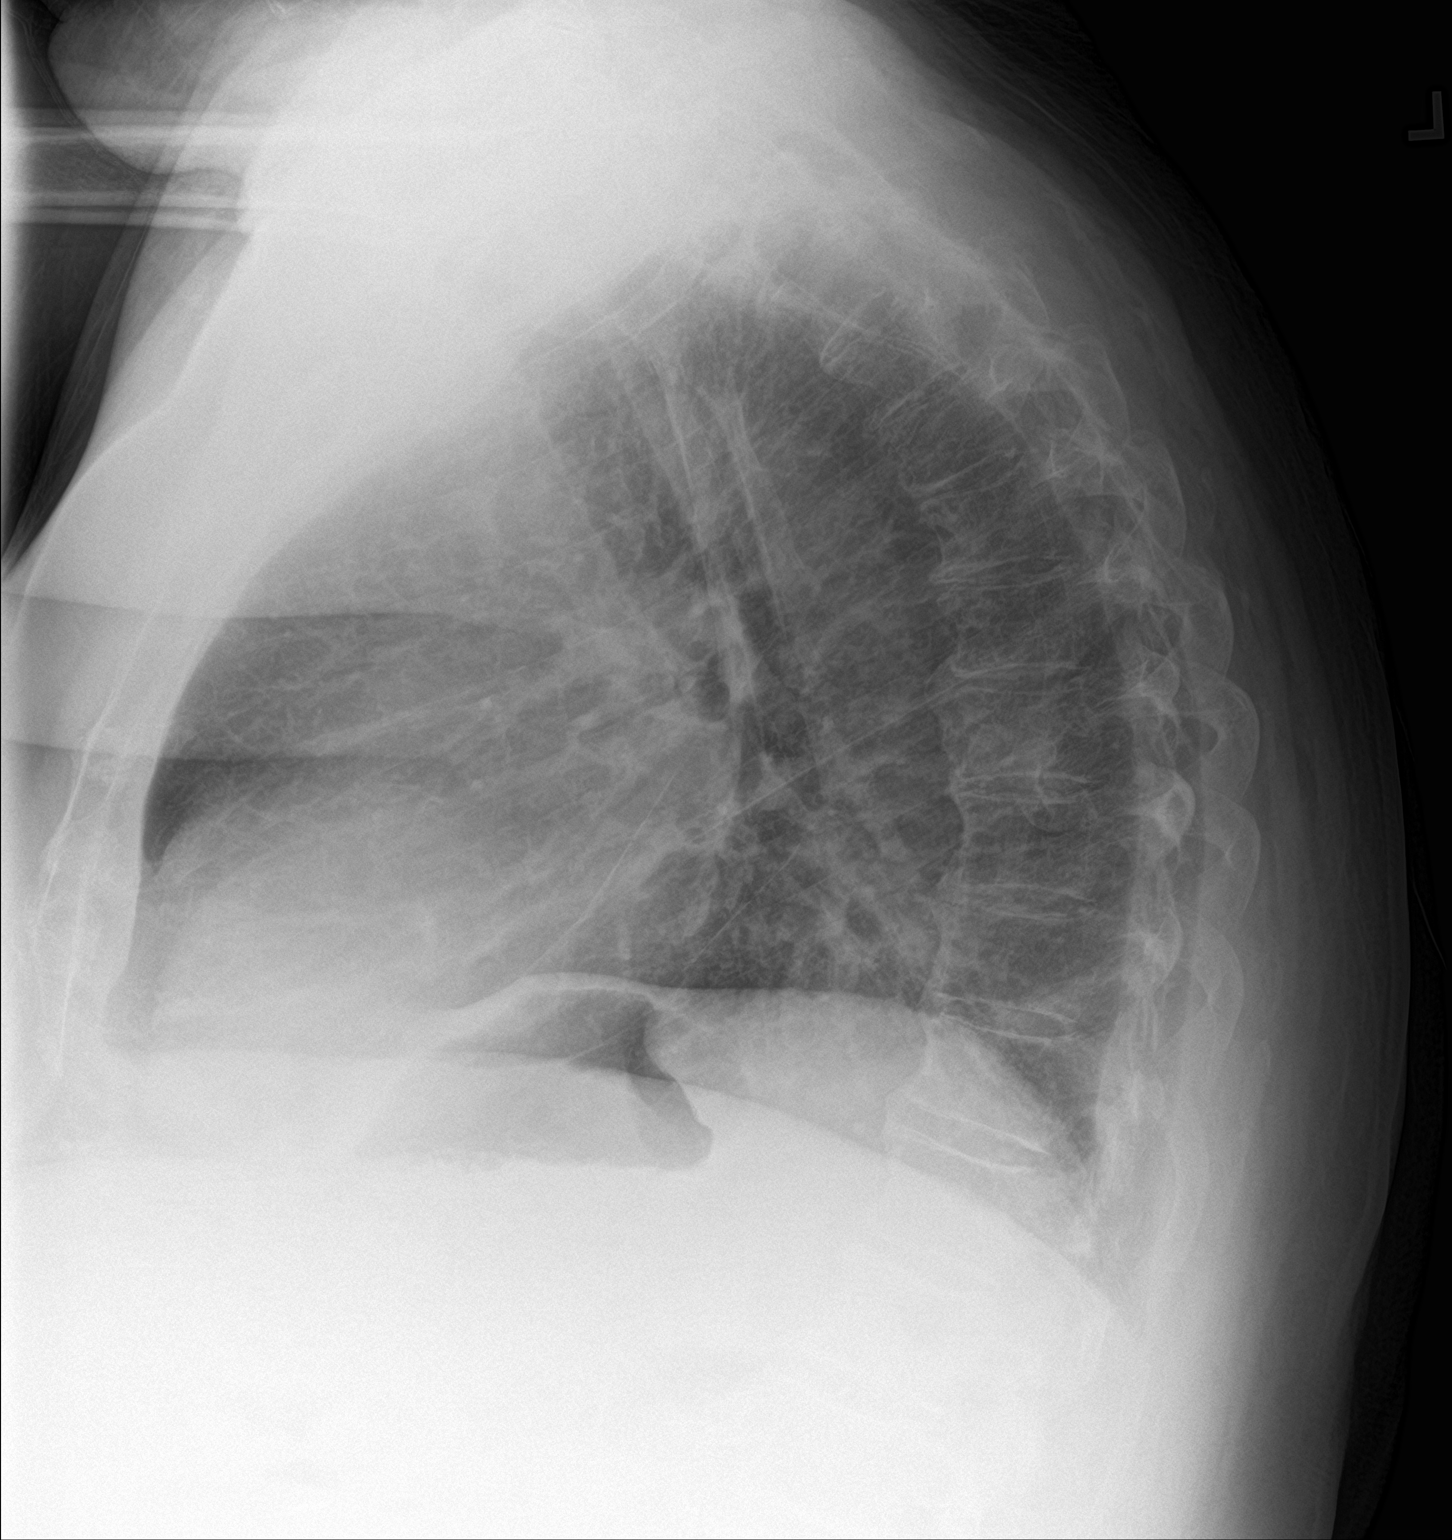

[2 of 2 positions shown; findings below may reference images not displayed]

FINDINGS: The cardiac silhouette, mediastinal and hilar contours are stable.
Stable tortuosity of the thoracic aorta. There are new patchy
bibasilar infiltrates. No pleural effusions or pulmonary lesions.
The bony thorax is intact.
IMPRESSION: Patchy bibasilar infiltrates.

## 2021-01-07 ENCOUNTER — Encounter: Payer: Self-pay | Admitting: Emergency Medicine

## 2021-01-07 ENCOUNTER — Emergency Department: Payer: Medicare PPO

## 2021-01-07 ENCOUNTER — Observation Stay
Admission: EM | Admit: 2021-01-07 | Discharge: 2021-01-09 | Disposition: A | Discharge status: Home / Self Care | Payer: Medicare PPO | Attending: Internal Medicine | Admitting: Internal Medicine

## 2021-01-07 ENCOUNTER — Other Ambulatory Visit: Payer: Self-pay

## 2021-01-07 DIAGNOSIS — R42 Dizziness and giddiness: Secondary | ICD-10-CM

## 2021-01-07 DIAGNOSIS — Z87891 Personal history of nicotine dependence: Secondary | ICD-10-CM | POA: Insufficient documentation

## 2021-01-07 DIAGNOSIS — Z7982 Long term (current) use of aspirin: Secondary | ICD-10-CM | POA: Diagnosis not present

## 2021-01-07 DIAGNOSIS — Z79899 Other long term (current) drug therapy: Secondary | ICD-10-CM | POA: Insufficient documentation

## 2021-01-07 DIAGNOSIS — I4892 Unspecified atrial flutter: Principal | ICD-10-CM

## 2021-01-07 DIAGNOSIS — I5033 Acute on chronic diastolic (congestive) heart failure: Secondary | ICD-10-CM | POA: Diagnosis not present

## 2021-01-07 DIAGNOSIS — Z8616 Personal history of COVID-19: Secondary | ICD-10-CM | POA: Insufficient documentation

## 2021-01-07 DIAGNOSIS — R079 Chest pain, unspecified: Secondary | ICD-10-CM | POA: Diagnosis present

## 2021-01-07 DIAGNOSIS — Z7901 Long term (current) use of anticoagulants: Secondary | ICD-10-CM | POA: Diagnosis not present

## 2021-01-07 DIAGNOSIS — Z20822 Contact with and (suspected) exposure to covid-19: Secondary | ICD-10-CM | POA: Diagnosis not present

## 2021-01-07 DIAGNOSIS — Z85528 Personal history of other malignant neoplasm of kidney: Secondary | ICD-10-CM | POA: Diagnosis not present

## 2021-01-07 DIAGNOSIS — Z8673 Personal history of transient ischemic attack (TIA), and cerebral infarction without residual deficits: Secondary | ICD-10-CM | POA: Diagnosis not present

## 2021-01-07 DIAGNOSIS — I130 Hypertensive heart and chronic kidney disease with heart failure and stage 1 through stage 4 chronic kidney disease, or unspecified chronic kidney disease: Secondary | ICD-10-CM | POA: Diagnosis not present

## 2021-01-07 DIAGNOSIS — I5022 Chronic systolic (congestive) heart failure: Secondary | ICD-10-CM | POA: Diagnosis present

## 2021-01-07 DIAGNOSIS — G4733 Obstructive sleep apnea (adult) (pediatric): Secondary | ICD-10-CM | POA: Diagnosis present

## 2021-01-07 DIAGNOSIS — E1122 Type 2 diabetes mellitus with diabetic chronic kidney disease: Secondary | ICD-10-CM | POA: Insufficient documentation

## 2021-01-07 DIAGNOSIS — R0789 Other chest pain: Secondary | ICD-10-CM

## 2021-01-07 DIAGNOSIS — Z794 Long term (current) use of insulin: Secondary | ICD-10-CM | POA: Diagnosis not present

## 2021-01-07 DIAGNOSIS — I2510 Atherosclerotic heart disease of native coronary artery without angina pectoris: Secondary | ICD-10-CM | POA: Insufficient documentation

## 2021-01-07 DIAGNOSIS — R000 Tachycardia, unspecified: Secondary | ICD-10-CM | POA: Diagnosis not present

## 2021-01-07 DIAGNOSIS — I10 Essential (primary) hypertension: Secondary | ICD-10-CM | POA: Diagnosis present

## 2021-01-07 DIAGNOSIS — J449 Chronic obstructive pulmonary disease, unspecified: Secondary | ICD-10-CM | POA: Insufficient documentation

## 2021-01-07 DIAGNOSIS — R918 Other nonspecific abnormal finding of lung field: Secondary | ICD-10-CM | POA: Diagnosis not present

## 2021-01-07 DIAGNOSIS — N1832 Chronic kidney disease, stage 3b: Secondary | ICD-10-CM | POA: Diagnosis not present

## 2021-01-07 HISTORY — DX: Chronic kidney disease, stage 3 unspecified: N18.30

## 2021-01-07 HISTORY — DX: Obstructive sleep apnea (adult) (pediatric): G47.33

## 2021-01-07 HISTORY — DX: Other cardiomyopathies: I42.8

## 2021-01-07 HISTORY — DX: Morbid (severe) obesity due to excess calories: E66.01

## 2021-01-07 HISTORY — DX: Chronic combined systolic (congestive) and diastolic (congestive) heart failure: I50.42

## 2021-01-07 LAB — BASIC METABOLIC PANEL
Anion gap: 9 (ref 5–15)
BUN: 37 mg/dL — ABNORMAL HIGH (ref 8–23)
CO2: 22 mmol/L (ref 22–32)
Calcium: 8.9 mg/dL (ref 8.9–10.3)
Chloride: 107 mmol/L (ref 98–111)
Creatinine, Ser: 2.2 mg/dL — ABNORMAL HIGH (ref 0.61–1.24)
GFR, Estimated: 32 mL/min — ABNORMAL LOW (ref >60)
Glucose, Bld: 126 mg/dL — ABNORMAL HIGH (ref 70–99)
Potassium: 4.4 mmol/L (ref 3.5–5.1)
Sodium: 138 mmol/L (ref 135–145)

## 2021-01-07 LAB — TROPONIN I (HIGH SENSITIVITY)
Troponin I (High Sensitivity): 12 ng/L (ref <18)
Troponin I (High Sensitivity): 14 ng/L (ref <18)

## 2021-01-07 LAB — RESP PANEL BY RT-PCR (FLU A&B, COVID) ARPGX2
Influenza A by PCR: NEGATIVE
Influenza B by PCR: NEGATIVE
SARS Coronavirus 2 by RT PCR: NEGATIVE

## 2021-01-07 LAB — CBC
HCT: 33.8 % — ABNORMAL LOW (ref 39.0–52.0)
Hemoglobin: 10.7 g/dL — ABNORMAL LOW (ref 13.0–17.0)
MCH: 27.4 pg (ref 26.0–34.0)
MCHC: 31.7 g/dL (ref 30.0–36.0)
MCV: 86.7 fL (ref 80.0–100.0)
Platelets: 211 10*3/uL (ref 150–400)
RBC: 3.9 MIL/uL — ABNORMAL LOW (ref 4.22–5.81)
RDW: 15.3 % (ref 11.5–15.5)
WBC: 6.5 10*3/uL (ref 4.0–10.5)
nRBC: 0 % (ref 0.0–0.2)

## 2021-01-07 LAB — CBG MONITORING, ED: Glucose-Capillary: 112 mg/dL — ABNORMAL HIGH (ref 70–99)

## 2021-01-07 MED ORDER — CARVEDILOL 12.5 MG PO TABS: 12.5000 mg | ORAL_TABLET | Freq: Two times a day (BID) | ORAL | 11 refills | Status: DC

## 2021-01-07 MED ORDER — ONDANSETRON HCL 4 MG/2ML IJ SOLN: 4.0000 mg | Freq: Four times a day (QID) | INTRAMUSCULAR | Status: DC | PRN

## 2021-01-07 MED ORDER — APIXABAN 2.5 MG PO TABS: 2.5000 mg | ORAL_TABLET | Freq: Two times a day (BID) | ORAL | 1 refills | Status: DC

## 2021-01-07 MED ORDER — MORPHINE SULFATE (PF) 2 MG/ML IV SOLN
2.0000 mg | Freq: Once | INTRAVENOUS | Status: AC
  Administered 2021-01-07: 2 mg via INTRAVENOUS
  Filled 2021-01-07: qty 1

## 2021-01-07 MED ORDER — ALPRAZOLAM 0.25 MG PO TABS: 0.2500 mg | ORAL_TABLET | Freq: Two times a day (BID) | ORAL | Status: DC | PRN

## 2021-01-07 MED ORDER — IPRATROPIUM-ALBUTEROL 0.5-2.5 (3) MG/3ML IN SOLN
3.0000 mL | Freq: Once | RESPIRATORY_TRACT | Status: AC
  Administered 2021-01-07: 3 mL via RESPIRATORY_TRACT
  Filled 2021-01-07: qty 3

## 2021-01-07 MED ORDER — INSULIN ASPART 100 UNIT/ML IJ SOLN
0.0000 [IU] | Freq: Three times a day (TID) | INTRAMUSCULAR | Status: DC
  Administered 2021-01-08 (×2): 3 [IU] via SUBCUTANEOUS
  Administered 2021-01-09: 2 [IU] via SUBCUTANEOUS
  Filled 2021-01-07 (×3): qty 1

## 2021-01-07 MED ORDER — HYDROCODONE-ACETAMINOPHEN 5-325 MG PO TABS: 1.0000 | ORAL_TABLET | ORAL | Status: DC | PRN

## 2021-01-07 MED ORDER — ACETAMINOPHEN 325 MG PO TABS
650.0000 mg | ORAL_TABLET | ORAL | Status: DC | PRN
  Administered 2021-01-07: 650 mg via ORAL

## 2021-01-07 MED ORDER — ACETAMINOPHEN 650 MG RE SUPP: 650.0000 mg | Freq: Four times a day (QID) | RECTAL | Status: DC | PRN

## 2021-01-07 MED ORDER — IOHEXOL 350 MG/ML SOLN: 60.0000 mL | Freq: Once | INTRAVENOUS | Status: DC | PRN

## 2021-01-07 MED ORDER — ACETAMINOPHEN 325 MG PO TABS
650.0000 mg | ORAL_TABLET | Freq: Four times a day (QID) | ORAL | Status: DC | PRN
  Filled 2021-01-07: qty 2

## 2021-01-07 MED ORDER — SENNOSIDES-DOCUSATE SODIUM 8.6-50 MG PO TABS: 1.0000 | ORAL_TABLET | Freq: Every evening | ORAL | Status: DC | PRN

## 2021-01-07 MED ORDER — INSULIN ASPART 100 UNIT/ML IJ SOLN: 0.0000 [IU] | Freq: Every day | INTRAMUSCULAR | Status: DC

## 2021-01-07 MED ORDER — ONDANSETRON HCL 4 MG PO TABS: 4.0000 mg | ORAL_TABLET | Freq: Four times a day (QID) | ORAL | Status: DC | PRN

## 2021-01-07 MED ORDER — IOHEXOL 350 MG/ML SOLN
50.0000 mL | Freq: Once | INTRAVENOUS | Status: AC | PRN
  Administered 2021-01-07: 50 mL via INTRAVENOUS

## 2021-01-07 MED ORDER — CARVEDILOL 6.25 MG PO TABS
12.5000 mg | ORAL_TABLET | Freq: Once | ORAL | Status: AC
  Administered 2021-01-07: 12.5 mg via ORAL
  Filled 2021-01-07: qty 2

## 2021-01-07 NOTE — H&P (Signed)
History and Physical    Larry Callahan KZL:[id number] DOB: [birth year] DOA: 01/07/2021  PCP: Lavera Guise, MD   Patient coming from: home  I have personally briefly reviewed patient's old medical records in [place]  Chief Complaint: chest pain  HPI: Larry Callahan is a 65 y.o. male with medical history significant for morbid obesity, CAD, CKD 3B, HTN, COPD, OSA, chronic systolic heart failure, last EF 35 to 40% in April 2022 with a moderate risk Myoview 09/2020, chest pain admission in May 2022 with unremarkable work-up and syncope admission June 2022 with negative work-up who now presents to the ED with nonradiating substernal chest pain that started on the morning of arrival associated with lightheadedness.  Chest pain is of moderate intensity associated with some shortness of breath.  He denied cough or fever.  Denies nausea, vomiting or diaphoresis.  He has associated lightheadedness.  Denies abdominal pain, diarrhea or vomiting.  ED course: On arrival vitals within normal limits  Troponin 12/14 with creatinine of 2.20 which is his baseline.  Hemoglobin 10.7 at baseline.  COVID and flu negative  EKG, personally viewed and interpreted atrial fibrillation with RVR at 122 with no acute ST-T wave changes  Imaging CT angio PE: No central PE.  Left basilar groundglass base opacity with differential considerations including infection, aspiration or atelectasis.  The ED provider consulted cardiologist Dr. Rockey Situ who recommended increasing patient's Coreg dose and starting him on Eliquis with outpatient close cardiology follow-up with admission if continued lightheadedness.   Patient had controlled and his rate while in the ED with rates below 110  however he continued to be lightheaded.  Hospitalist consulted for admission.    Review of Systems: As per HPI otherwise all other systems on review of systems negative.    Past Medical History:  Diagnosis Date   Allergy     COPD (chronic obstructive pulmonary disease) ([place])    Coronary artery disease    Diabetes mellitus without complication (HCC)    Diastolic CHF ([place])    GERD (gastroesophageal reflux disease)    Hyperlipidemia    Hypertension    Renal insufficiency    Sleep apnea     Past Surgical History:  Procedure Laterality Date   BACK SURGERY     CARDIAC CATHETERIZATION     CHOLECYSTECTOMY     COLONOSCOPY WITH PROPOFOL N/A 04/12/2019   Procedure: COLONOSCOPY WITH PROPOFOL;  Surgeon: [name], MD;  Location: [name] ENDOSCOPY;  Service: Gastroenterology;  Laterality: N/A;   ESOPHAGOGASTRODUODENOSCOPY N/A 04/12/2019   Procedure: ESOPHAGOGASTRODUODENOSCOPY (EGD);  Surgeon: [name], MD;  Location: [facility] ENDOSCOPY;  Service: Gastroenterology;  Laterality: N/A;   ESOPHAGOGASTRODUODENOSCOPY (EGD) WITH PROPOFOL N/A 11/21/2019   Procedure: ESOPHAGOGASTRODUODENOSCOPY (EGD) WITH PROPOFOL;  Surgeon: [name], MD;  Location: [facility] ENDOSCOPY;  Service: Gastroenterology;  Laterality: N/A;   FLEXIBLE BRONCHOSCOPY Bilateral 05/17/2019   Procedure: FLEXIBLE BRONCHOSCOPY;  Surgeon: [name], MD;  Location: [name] ORS;  Service: Pulmonary;  Laterality: Bilateral;   left arm surgery     nephrectomy Left    PARATHYROIDECTOMY     RIGHT HEART CATH N/A 03/29/2019   Procedure: RIGHT HEART CATH;  Surgeon: [name], MD;  Location: [facility];  Service: Cardiovascular;  Laterality: N/A;     reports that he has quit smoking. His smoking use included cigarettes. He has quit using smokeless tobacco.  His smokeless tobacco use included chew. He reports that he does not drink alcohol and does  not use drugs.  Allergies  Allergen Reactions   Penicillins Rash    Family History  Problem Relation Age of Onset   Diabetes Mother        2003   Lung cancer Father    Diabetes Sister    Hypertension Sister    Heart disease Sister    Cancer Sister    Bone cancer Brother        Prior to Admission medications   Medication Sig Start Date End Date Taking? Authorizing Provider  apixaban (ELIQUIS) 2.5 MG TABS tablet Take 1 tablet (2.5 mg total) by mouth 2 (two) times daily. 01/07/21  Yes Arta Silence, MD  carvedilol (COREG) 12.5 MG tablet Take 1 tablet (12.5 mg total) by mouth 2 (two) times daily. 01/07/21 01/07/22 Yes Arta Silence, MD  albuterol (VENTOLIN HFA) 108 (90 Base) MCG/ACT inhaler Inhale 2 puffs into the lungs every 6 (six) hours as needed for wheezing or shortness of breath. 08/31/20   Lavera Guise, MD  aspirin EC 81 MG EC tablet Take 1 tablet (81 mg total) by mouth daily. Swallow whole. 11/01/20   Lorella Nimrod, MD  Blood Glucose Calibration (TRUE METRIX LEVEL 1) Low SOLN Use as directed 04/04/18   [name], NP  Blood Glucose Monitoring Suppl (TRUE METRIX AIR GLUCOSE METER) DEVI 1 Device by Does not apply route 2 (two) times daily. 04/13/18   [name], NP  budesonide-formoterol (SYMBICORT) 160-4.5 MCG/ACT inhaler Inhale 2 puffs into the lungs 2 (two) times daily. 07/20/20   Lavera Guise, MD  cetirizine (ZYRTEC) 10 MG tablet Take 10 mg by mouth daily.    [provider]  doxycycline (VIBRA-TABS) 100 MG tablet Take one tab po BID x 1 week. Then decrease to QD thereafter. Take with food. 12/08/20   Ralene Bathe, MD  escitalopram (LEXAPRO) 10 MG tablet Take one tab po qd for anxiety with food Patient taking differently: Take 10 mg by mouth daily. Take one tab po qd for anxiety with food 07/17/20   Lavera Guise, MD  ferrous gluconate (FERGON) 324 MG tablet Take 1 tablet (324 mg total) by mouth daily with breakfast. 08/01/19   [name], NP  furosemide (LASIX) 40 MG tablet Take one to tabs once or twice days for fluid // swelling 07/17/20   Lavera Guise, MD  gabapentin (NEURONTIN) 100 MG capsule Take 1 capsule (100 mg total) by mouth in the morning, at noon, and at bedtime. 07/17/20   Lavera Guise, MD  glimepiride Jari Sportsman) 2  MG tablet Take one tab po qd with supper 07/17/20   Lavera Guise, MD  glucose blood (TRUE METRIX BLOOD GLUCOSE TEST) test strip Use as instructed twice daily diag E11.65 07/30/19   [name], NP  hydrALAZINE (APRESOLINE) 100 MG tablet Take 1 tablet (100 mg total) by mouth 3 (three) times daily. 07/17/20   Lavera Guise, MD  insulin glargine, 1 Unit Dial, (TOUJEO) 300 UNIT/ML Solostar Pen Inject 26 Units into the skin daily. 04/30/20   [name], NP  ipratropium-albuterol (DUONEB) 0.5-2.5 (3) MG/3ML SOLN Take 3 mLs by nebulization every 6 (six) hours as needed. 10/11/20   Lavera Guise, MD  isosorbide mononitrate (IMDUR) 30 MG 24 hr tablet Take 1 tablet (30 mg total) by mouth 2 (two) times daily. 10/08/20   Jennye Boroughs, MD  meclizine (ANTIVERT) 25 MG tablet Take 1 tablet (25 mg total) by mouth 3 (three) times daily as  needed for dizziness or nausea. 11/25/20   Paulette Blanch, MD  mupirocin ointment (BACTROBAN) 2 % Apply 1 application topically daily. Qd to excision site 10/06/20   Ralene Bathe, MD  NOVOFINE PEN NEEDLE 32G X 6 MM MISC Use as directed 10/15/20   Lavera Guise, MD  omeprazole (PRILOSEC) 40 MG capsule Take 1 capsule (40 mg total) by mouth in the morning and at bedtime. 07/17/20   Lavera Guise, MD  potassium chloride SA (KLOR-CON) 20 MEQ tablet Take 1 tablet (20 mEq total) by mouth 2 (two) times daily. 07/17/20   Lavera Guise, MD  tiotropium (SPIRIVA HANDIHALER) 18 MCG inhalation capsule Place 1 capsule (18 mcg total) into inhaler and inhale daily. 11/10/20 02/08/21  Lavera Guise, MD  TRUEplus Lancets 33G MISC Use as directed twice daily diag E11.65 07/30/19   [name], NP    Physical Exam: Vitals:   01/07/21 1731 01/07/21 1800 01/07/21 1830 01/07/21 1900  BP: (!) 156/95 (!) 146/89 (!) 159/87 (!) 148/91  Pulse: 92 73 79 71  Resp:  15 15   Temp:      TempSrc:      SpO2:  97% 95% 95%     Vitals:   01/07/21 1731 01/07/21 1800 01/07/21 1830 01/07/21 1900  BP: (!)  156/95 (!) 146/89 (!) 159/87 (!) 148/91  Pulse: 92 73 79 71  Resp:  15 15   Temp:      TempSrc:      SpO2:  97% 95% 95%      Constitutional: Alert and oriented x 3 . Not in any apparent distress HEENT:      Head: Normocephalic and atraumatic.         Eyes: PERLA, EOMI, Conjunctivae are normal. Sclera is non-icteric.       Mouth/Throat: Mucous membranes are moist.       Neck: Supple with no signs of meningismus. Cardiovascular: Irregularly irregular. No murmurs, gallops, or rubs. 2+ symmetrical distal pulses are present . No JVD. No LE edema Respiratory: Respiratory effort normal .Lungs sounds clear bilaterally. No wheezes, crackles, or rhonchi.  Gastrointestinal: Soft, non tender, and non distended with positive bowel sounds.  Genitourinary: No CVA tenderness. Musculoskeletal: Nontender with normal range of motion in all extremities. No cyanosis, or erythema of extremities. Neurologic:  Face is symmetric. Moving all extremities. No gross focal neurologic deficits . Skin: Skin is warm, dry.  No rash or ulcers Psychiatric: Mood and affect are normal    Labs on Admission: I have personally reviewed following labs and imaging studies  CBC: Recent Labs  Lab 01/07/21 1033  WBC 6.5  HGB 10.7*  HCT 33.8*  MCV 86.7  PLT 016   Basic Metabolic Panel: Recent Labs  Lab 01/07/21 1033  NA 138  K 4.4  CL 107  CO2 22  GLUCOSE 126*  BUN 37*  CREATININE 2.20*  CALCIUM 8.9   GFR: CrCl cannot be calculated (Unknown ideal weight.). Liver Function Tests: No results for input(s): AST, ALT, ALKPHOS, BILITOT, PROT, ALBUMIN in the last 168 hours. No results for input(s): LIPASE, AMYLASE in the last 168 hours. No results for input(s): AMMONIA in the last 168 hours. Coagulation Profile: No results for input(s): INR, PROTIME in the last 168 hours. Cardiac Enzymes: No results for input(s): CKTOTAL, CKMB, CKMBINDEX, TROPONINI in the last 168 hours. BNP (last 3 results) No results for  input(s): PROBNP in the last 8760 hours. HbA1C: No results for input(s): HGBA1C in  the last 72 hours. CBG: No results for input(s): GLUCAP in the last 168 hours. Lipid Profile: No results for input(s): CHOL, HDL, LDLCALC, TRIG, CHOLHDL, LDLDIRECT in the last 72 hours. Thyroid Function Tests: No results for input(s): TSH, T4TOTAL, FREET4, T3FREE, THYROIDAB in the last 72 hours. Anemia Panel: No results for input(s): VITAMINB12, FOLATE, FERRITIN, TIBC, IRON, RETICCTPCT in the last 72 hours. Urine analysis:    Component Value Date/Time   COLORURINE YELLOW (A) 11/30/2020 1256   APPEARANCEUR CLEAR (A) 11/30/2020 1256   APPEARANCEUR Cloudy (A) 11/23/2018 1103   LABSPEC 1.009 11/30/2020 1256   LABSPEC 1.005 03/06/2013 0929   PHURINE 5.0 11/30/2020 1256   GLUCOSEU 50 (A) 11/30/2020 1256   GLUCOSEU Negative 03/06/2013 0929   HGBUR NEGATIVE 11/30/2020 1256   BILIRUBINUR NEGATIVE 11/30/2020 1256   BILIRUBINUR negative 02/20/2019 1404   BILIRUBINUR Negative 11/23/2018 1103   BILIRUBINUR Negative 03/06/2013 0929   KETONESUR NEGATIVE 11/30/2020 1256   PROTEINUR 100 (A) 11/30/2020 1256   UROBILINOGEN 0.2 02/20/2019 1404   NITRITE NEGATIVE 11/30/2020 1256   LEUKOCYTESUR NEGATIVE 11/30/2020 1256   LEUKOCYTESUR Negative 03/06/2013 0929    Radiological Exams on Admission: DG Chest 2 View  Result Date: 01/07/2021 CLINICAL DATA:  Chest pain EXAM: CHEST - 2 VIEW COMPARISON:  11/24/2020 FINDINGS: Heart is upper limits normal in size. Lungs clear. No effusions or edema. No acute bony abnormality. IMPRESSION: No active cardiopulmonary disease. Electronically Signed   By: Rolm Baptise M.D.   On: 01/07/2021 10:59   CT Angio Chest PE W and/or Wo Contrast  Result Date: 01/07/2021 CLINICAL DATA:  PE suspected, high prob EXAM: CT ANGIOGRAPHY CHEST WITH CONTRAST TECHNIQUE: Multidetector CT imaging of the chest was performed using the standard protocol during bolus administration of intravenous contrast.  Multiplanar CT image reconstructions and MIPs were obtained to evaluate the vascular anatomy. CONTRAST:  66m OMNIPAQUE IOHEXOL 350 MG/ML SOLN COMPARISON:  Oct 30, 2020 FINDINGS: Cardiovascular: Evaluation is limited secondary to suboptimal contrast opacification, artifact and respiratory motion. No central pulmonary embolism. No definitive acute pulmonary embolism through the proximal segmental pulmonary arteries. There are predominately LEFT-sided coronary artery atherosclerotic calcifications. No pericardial effusion. Heart is at the upper limits of normal in size. LEFT vertebral artery arises directly from the aorta. Scattered atherosclerotic calcifications throughout the aorta. Mediastinum/Nodes: No axillary or mediastinal adenopathy. Thyroid is unremarkable. Lungs/Pleura: Small LEFT pleural effusion. LEFT basilar ground-glass opacity. Evaluation of parenchymal detail is limited due to extensive respiratory motion. Upper Abdomen: Splenomegaly with multiple coarse calcifications likely reflecting sequela of prior granulomatous infection. Renal cysts. Musculoskeletal: Degenerative changes of the thoracic spine. Gynecomastia. Review of the MIP images confirms the above findings. IMPRESSION: 1. No central pulmonary embolism within the limitations of this exam. If persistent concern consider dedicated lower extremity ultrasound or V/Q scan. 2. Small LEFT pleural effusion. 3. LEFT basilar ground-glass opacity, nonspecific with differential considerations including infection, aspiration or atelectasis. Aortic Atherosclerosis (ICD10-I70.0). Electronically Signed   By: SValentino SaxonMD   On: 01/07/2021 15:17     Assessment/Plan 65year old male with history of morbid obesity, CAD CKD 3B, HTN, COPD, OSA, chronic systolic heart failure, presenting with chest pain and shortness of breath.  Found to be in new onset A. fib with RVR, controlled while in the ED, but with persistent lightheadedness and headache.     Chest pain   New onset atrial flutter ([place] -Patient with chest pain but with negative troponins of 12-14 and with EKG nonacute - Hospitalized with chest pain  2 months prior with negative cardiac work-up, - Moderate risk Myoview 09/2020 - Rapid A. fib in the ED to 122 controlled with Coreg - Rate controlled on Coreg, increased to 12.5 mg twice daily per Dr. Rockey Situ - Started on Eliquis - Cardiology consult  Lightheadedness/headache - Lightheadedness persistent in spite of control of heart rate - Negative syncope work-up in June 2022 - Neuro exam nonfocal - Continue to monitor with neurochecks    Chronic systolic CHF  - Echocardiogram 4/22 with EF 35 to 40% - Continue carvedilol, furosemide, Imdur.  Not currently on ACE/ARB likely related to renal function  Obstructive sleep apnea, adult - CPAP    COPD - Not acutely exacerbated - Albuterol as needed, home inhalers    Essential hypertension -Continue hydralazine  Diabetes - Sliding scale insulin coverage    Stage 3b chronic kidney disease ([place]) - Renal function at baseline   DVT prophylaxis: eliquis  Code Status: full code  Family Communication:  none  Disposition Plan: Back to previous home environment Consults called: cardiology  Status:observation    Athena Masse MD [facility]     01/07/2021, 8:35 PM

## 2021-01-07 NOTE — ED Provider Notes (Signed)
[facility] Emergency Department Provider Note ____________________________________________   Event Date/Time   First MD Initiated Contact with Patient 01/07/21 1344     (approximate)  I have reviewed the triage vital signs and the nursing notes.   HISTORY  Chief Complaint Chest Pain    HPI Larry Callahan is a 65 y.o. male with PMH as noted below including COPD, CAD, DM, and CHF who presents with chest pain, cute onset around 5 AM and persistent since that time, substernal in location, described as an electrical shock sensation, and nonradiating.  The patient also reports some shortness of breath but and a mild cough but denies fever.  He has no nausea or vomiting but does describe some lightheadedness.  He states he has had chest pain like this before.  Past Medical History:  Diagnosis Date   Allergy    COPD (chronic obstructive pulmonary disease) ([place])    Coronary artery disease    Diabetes mellitus without complication (HCC)    Diastolic CHF ([place])    GERD (gastroesophageal reflux disease)    Hyperlipidemia    Hypertension    Renal insufficiency    Sleep apnea     Patient Active Problem List   Diagnosis Date Noted   Syncope and collapse 11/16/2020   Demand ischemia ([place])    Influenza A 10/30/2020   Chest pain 10/06/2020   HLD (hyperlipidemia) 10/06/2020   History of CVA (cerebrovascular accident) 09/25/2020   Gastroesophageal reflux disease without esophagitis 04/19/2020   Pneumonia due to COVID-19 virus 01/30/2020   Elevated troponin I level 01/29/2020   Suspected COVID-19 virus infection 01/29/2020   Postural dizziness with presyncope 01/29/2020   Hematemesis 01/09/2020   Hospital discharge follow-up 11/26/2019   Cellulitis of left hand 09/26/2019   Generalized weakness 08/08/2019   Obesity (BMI 30-39.9) 08/08/2019   Acute right hip pain 06/17/2019   Inability to ambulate due to hip 06/17/2019   CKD stage 3 due to type 2  diabetes mellitus (HCC)    Hypervolemia    Acute on chronic diastolic CHF (congestive heart failure) ([place])    Type 2 diabetes mellitus with stage 3b chronic kidney disease, with long-term current use of insulin ([place])    Hypomagnesemia    Full code status 05/20/2019   Pleuritic chest pain    Multifocal pneumonia 05/19/2019   Acute kidney injury superimposed on CKD 3b ([place]) 05/09/2019   Hemoptysis 04/09/2019   Shortness of breath 03/26/2019   Uncontrolled type 2 diabetes mellitus with insulin therapy ([place]) 03/12/2019   Acute on chronic heart failure with preserved ejection fraction (HFpEF) (HCC)    Chronic kidney disease, stage 3b (HCC)    Chronic systolic CHF (congestive heart failure) ([place]) 76/72/0947   Diastolic dysfunction 09/62/8366   Iron deficiency anemia 12/31/2018   Atopic dermatitis 11/24/2018   Screening for colon cancer 11/06/2017   Uncontrolled type 2 diabetes mellitus with hyperglycemia ([place]) 11/06/2017   Hidradenitis 06/09/2017   Atherosclerotic heart disease of native coronary artery without angina pectoris 06/09/2017   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) 06/09/2017   Cigarette nicotine dependence with nicotine-induced disorder 06/09/2017   Diabetic polyneuropathy associated with type 2 diabetes mellitus ([place]) 06/09/2017   Allergic rhinitis due to pollen 06/09/2017   Mixed hyperlipidemia 06/09/2017   COPD (chronic obstructive pulmonary disease) ([place]) 06/09/2017   Dyspnea on exertion 06/09/2017   Wheezing  06/09/2017   Dysuria 06/09/2017   Snoring 06/09/2017   Essential hypertension 06/09/2017   Personal history of other malignant neoplasm of kidney 06/09/2017   Pain in right hip 06/09/2017   Impacted cerumen, bilateral 06/09/2017   Secondary hyperparathyroidism, not elsewhere classified ([place]) 06/09/2017   Type 2 diabetes mellitus  with hyperglycemia ([place]) 06/09/2017   Tinea corporis 06/09/2017    Past Surgical History:  Procedure Laterality Date   BACK SURGERY     CARDIAC CATHETERIZATION     CHOLECYSTECTOMY     COLONOSCOPY WITH PROPOFOL N/A 04/12/2019   Procedure: COLONOSCOPY WITH PROPOFOL;  Surgeon: [name], MD;  Location: [name] ENDOSCOPY;  Service: Gastroenterology;  Laterality: N/A;   ESOPHAGOGASTRODUODENOSCOPY N/A 04/12/2019   Procedure: ESOPHAGOGASTRODUODENOSCOPY (EGD);  Surgeon: [name], MD;  Location: [facility] ENDOSCOPY;  Service: Gastroenterology;  Laterality: N/A;   ESOPHAGOGASTRODUODENOSCOPY (EGD) WITH PROPOFOL N/A 11/21/2019   Procedure: ESOPHAGOGASTRODUODENOSCOPY (EGD) WITH PROPOFOL;  Surgeon: [name], MD;  Location: [facility] ENDOSCOPY;  Service: Gastroenterology;  Laterality: N/A;   FLEXIBLE BRONCHOSCOPY Bilateral 05/17/2019   Procedure: FLEXIBLE BRONCHOSCOPY;  Surgeon: [name], MD;  Location: [name] ORS;  Service: Pulmonary;  Laterality: Bilateral;   left arm surgery     nephrectomy Left    PARATHYROIDECTOMY     RIGHT HEART CATH N/A 03/29/2019   Procedure: RIGHT HEART CATH;  Surgeon: [name], MD;  Location: [place] CV LAB;  Service: Cardiovascular;  Laterality: N/A;    Prior to Admission medications   Medication Sig Start Date End Date Taking? Authorizing Provider  apixaban (ELIQUIS) 2.5 MG TABS tablet Take 1 tablet (2.5 mg total) by mouth 2 (two) times daily. 01/07/21  Yes Arta Silence, MD  carvedilol (COREG) 12.5 MG tablet Take 1 tablet (12.5 mg total) by mouth 2 (two) times daily. 01/07/21 01/07/22 Yes Arta Silence, MD  albuterol (VENTOLIN HFA) 108 (90 Base) MCG/ACT inhaler Inhale 2 puffs into the lungs every 6 (six) hours as needed for wheezing or shortness of breath. 08/31/20   Lavera Guise, MD  aspirin EC 81 MG EC tablet Take 1 tablet (81 mg total) by mouth daily. Swallow whole. 11/01/20   Lorella Nimrod, MD  Blood Glucose Calibration  (TRUE METRIX LEVEL 1) Low SOLN Use as directed 04/04/18   [name], NP  Blood Glucose Monitoring Suppl (TRUE METRIX AIR GLUCOSE METER) DEVI 1 Device by Does not apply route 2 (two) times daily. 04/13/18   [name], NP  budesonide-formoterol (SYMBICORT) 160-4.5 MCG/ACT inhaler Inhale 2 puffs into the lungs 2 (two) times daily. 07/20/20   Lavera Guise, MD  cetirizine (ZYRTEC) 10 MG tablet Take 10 mg by mouth daily.    [provider]  doxycycline (VIBRA-TABS) 100 MG tablet Take one tab po BID x 1 week. Then decrease to QD thereafter. Take with food. 12/08/20   Ralene Bathe, MD  escitalopram (LEXAPRO) 10 MG tablet Take one tab po qd for anxiety with food Patient taking differently: Take 10 mg by mouth daily. Take one tab po qd for anxiety with food 07/17/20   Lavera Guise, MD  ferrous gluconate (FERGON) 324 MG tablet Take 1 tablet (324 mg total) by mouth daily with breakfast. 08/01/19   [name], NP  furosemide (LASIX) 40 MG tablet Take one to tabs once or twice days for fluid // swelling 07/17/20   Lavera Guise, MD  gabapentin (NEURONTIN) 100 MG capsule Take 1 capsule (100 mg total) by mouth  in the morning, at noon, and at bedtime. 07/17/20   Lavera Guise, MD  glimepiride Jari Sportsman) 2 MG tablet Take one tab po qd with supper 07/17/20   Lavera Guise, MD  glucose blood (TRUE METRIX BLOOD GLUCOSE TEST) test strip Use as instructed twice daily diag E11.65 07/30/19   [name], NP  hydrALAZINE (APRESOLINE) 100 MG tablet Take 1 tablet (100 mg total) by mouth 3 (three) times daily. 07/17/20   Lavera Guise, MD  insulin glargine, 1 Unit Dial, (TOUJEO) 300 UNIT/ML Solostar Pen Inject 26 Units into the skin daily. 04/30/20   [name], NP  ipratropium-albuterol (DUONEB) 0.5-2.5 (3) MG/3ML SOLN Take 3 mLs by nebulization every 6 (six) hours as needed. 10/11/20   Lavera Guise, MD  isosorbide mononitrate (IMDUR) 30 MG 24 hr tablet Take 1 tablet (30 mg total) by mouth 2  (two) times daily. 10/08/20   Jennye Boroughs, MD  meclizine (ANTIVERT) 25 MG tablet Take 1 tablet (25 mg total) by mouth 3 (three) times daily as needed for dizziness or nausea. 11/25/20   Paulette Blanch, MD  mupirocin ointment (BACTROBAN) 2 % Apply 1 application topically daily. Qd to excision site 10/06/20   Ralene Bathe, MD  NOVOFINE PEN NEEDLE 32G X 6 MM MISC Use as directed 10/15/20   Lavera Guise, MD  omeprazole (PRILOSEC) 40 MG capsule Take 1 capsule (40 mg total) by mouth in the morning and at bedtime. 07/17/20   Lavera Guise, MD  potassium chloride SA (KLOR-CON) 20 MEQ tablet Take 1 tablet (20 mEq total) by mouth 2 (two) times daily. 07/17/20   Lavera Guise, MD  tiotropium (SPIRIVA HANDIHALER) 18 MCG inhalation capsule Place 1 capsule (18 mcg total) into inhaler and inhale daily. 11/10/20 02/08/21  Lavera Guise, MD  TRUEplus Lancets 33G MISC Use as directed twice daily diag E11.65 07/30/19   [name], NP    Allergies Penicillins  Family History  Problem Relation Age of Onset   Diabetes Mother        2003   Lung cancer Father    Diabetes Sister    Hypertension Sister    Heart disease Sister    Cancer Sister    Bone cancer Brother     Social History Social History   Tobacco Use   Smoking status: Former    Types: Cigarettes   Smokeless tobacco: Former    Types: Nurse, children's Use: Never used  Substance Use Topics   Alcohol use: No   Drug use: No    Review of Systems  Constitutional: No fever/chills Eyes: No visual changes. ENT: No sore throat. Cardiovascular: Positive for chest pain. Respiratory: Positive for shortness of breath. Gastrointestinal: No vomiting or diarrhea.  Genitourinary: Negative for dysuria.  Musculoskeletal: Negative for back pain.  No leg pain or swelling. Skin: Negative for rash. Neurological: Negative for headaches, focal weakness or numbness.   ____________________________________________   PHYSICAL EXAM:  VITAL  SIGNS: ED Triage Vitals  Enc Vitals Group     BP 01/07/21 1033 123/72     Pulse Rate 01/07/21 1033 72     Resp 01/07/21 1033 16     Temp 01/07/21 1033 97.7 F (36.5 C)     Temp Source 01/07/21 1033 Oral     SpO2 01/07/21 1033 96 %     Weight --      Height --      Head  Circumference --      Peak Flow --      Pain Score 01/07/21 1034 7     Pain Loc --      Pain Edu? --      Excl. in [place]? --     Constitutional: Alert and oriented.  Relatively well appearing and in no acute distress. Eyes: Conjunctivae are normal.  Head: Atraumatic. Nose: No congestion/rhinnorhea. Mouth/Throat: Mucous membranes are moist.   Neck: Normal range of motion.  Cardiovascular: Normal rate, regular rhythm. Grossly normal heart sounds.  Good peripheral circulation. Respiratory: Normal respiratory effort.  No retractions. Lungs CTAB. Gastrointestinal: No distention.  Musculoskeletal: No lower extremity edema.  No calf or popliteal swelling or tenderness.  Extremities warm and well perfused.  Neurologic:  Normal speech and language. No gross focal neurologic deficits are appreciated.  Skin:  Skin is warm and dry. No rash noted. Psychiatric: Mood and affect are normal. Speech and behavior are normal.  ____________________________________________   LABS (all labs ordered are listed, but only abnormal results are displayed)  Labs Reviewed  BASIC METABOLIC PANEL - Abnormal; Notable for the following components:      Result Value   Glucose, Bld 126 (*)    BUN 37 (*)    Creatinine, Ser 2.20 (*)    GFR, Estimated 32 (*)    All other components within normal limits  CBC - Abnormal; Notable for the following components:   RBC 3.90 (*)    Hemoglobin 10.7 (*)    HCT 33.8 (*)    All other components within normal limits  RESP PANEL BY RT-PCR (FLU A&B, COVID) ARPGX2  TROPONIN I (HIGH SENSITIVITY)  TROPONIN I (HIGH SENSITIVITY)   ____________________________________________  EKG  ED ECG REPORT I,  Arta Silence, the attending physician, personally viewed and interpreted this ECG.  Date: 01/07/2021 EKG Time: 1025 Rate: 122 Rhythm: Atrial fibrillation with RVR QRS Axis: normal Intervals: normal ST/T Wave abnormalities: Nonspecific T wave abnormalities Narrative Interpretation: Atrial fibrillation with nonspecific abnormalities but no evidence of acute ischemia  ____________________________________________  RADIOLOGY  Chest x-ray interpreted by me shows no focal consolidation or edema CT angio chest: No acute PE.  Left basilar groundglass opacity ____________________________________________   PROCEDURES  Procedure(s) performed: No  Procedures  Critical Care performed: No ____________________________________________   INITIAL IMPRESSION / ASSESSMENT AND PLAN / ED COURSE  Pertinent labs & imaging results that were available during my care of the patient were reviewed by me and considered in my medical decision making (see chart for details).   65 year old male with PMH as noted above including COPD, CHF, CAD, DM presents with somewhat atypical chest pain since 5 AM this morning associated with some shortness of breath and cough.  He describes mild hemoptysis but states that he has had this previously.  On exam the patient is overall well-appearing.  His vital signs are normal except for hypertension and tachycardia to around 110.  His heart rate is slightly irregular.  Lungs are clear to auscultation.  There is no significant peripheral edema.  Exam is otherwise as described above.  EKG shows atrial fibrillation/flutter which appears to be a new onset for the patient.  I reviewed the past medical records in epic and see that the patient follows with Dr. Rockey Situ from cardiology but there is no prior record of him being in atrial fibrillation.  Differential includes symptoms related to new onset atrial flutter, ACS, COPD exacerbation, acute bronchitis, pneumonia, COVID-19  or other viral etiology, or  CHF exacerbation.  Initial chest x-ray is unrevealing.  Due to the tachycardia and presence of hemoptysis I will obtain a CT to rule out PE in addition to basic labs and cardiac enzymes.  ----------------------------------------- 4:26 PM on 01/07/2021 -----------------------------------------  CT is negative.  I consulted Dr. Rockey Situ from cardiology who is the patient's cardiologist.  He recommends giving the patient higher dose of Coreg and starting him on Eliquis.  If the patient feels well and is rate controlled in the ED he can go home with a plan for close cardiology follow-up.  The patient does report some near syncope and worse palpitations when he stands up so if he remains symptomatic he may require admission.  I signed him out to the oncoming ED physician Dr. Cheri Fowler.  ____________________________________________   FINAL CLINICAL IMPRESSION(S) / ED DIAGNOSES  Final diagnoses:  Atrial flutter, unspecified type ([place])      NEW MEDICATIONS STARTED DURING THIS VISIT:  New Prescriptions   APIXABAN (ELIQUIS) 2.5 MG TABS TABLET    Take 1 tablet (2.5 mg total) by mouth 2 (two) times daily.   CARVEDILOL (COREG) 12.5 MG TABLET    Take 1 tablet (12.5 mg total) by mouth 2 (two) times daily.     Note:  This document was prepared using Dragon voice recognition software and may include unintentional dictation errors.    Arta Silence, MD 01/07/21 [phone]

## 2021-01-07 NOTE — ED Notes (Signed)
Pt. Provided diabetic [place] sandwich tray, states he has not eaten all day.

## 2021-01-07 NOTE — ED Triage Notes (Signed)
Chest pain mid upper chest --stabbing.  Started 5am.  Per ems fsbs 134, aspirin 325 given.

## 2021-01-07 NOTE — ED Notes (Signed)
Pt. Provided blankets, lights dimmed, states he is going to try and sleep.

## 2021-01-07 NOTE — Discharge Instructions (Signed)
Stop your carvedilol (Coreg) 6.125 mg tablets and start taking the 12.5 mg tablets.  You should also start taking the blood thinner Eliquis (apixaban) prescribed today.  Follow-up with Dr. Rockey Situ within the next week.  Return to the ER for new, worsening, or persistent severe palpitations, chest pain, difficulty breathing, weakness, or any other new or worsening symptoms that concern you.

## 2021-01-08 ENCOUNTER — Encounter: Payer: Self-pay | Admitting: Internal Medicine

## 2021-01-08 DIAGNOSIS — I483 Typical atrial flutter: Secondary | ICD-10-CM | POA: Diagnosis not present

## 2021-01-08 DIAGNOSIS — I5022 Chronic systolic (congestive) heart failure: Secondary | ICD-10-CM

## 2021-01-08 DIAGNOSIS — G4733 Obstructive sleep apnea (adult) (pediatric): Secondary | ICD-10-CM

## 2021-01-08 DIAGNOSIS — R42 Dizziness and giddiness: Secondary | ICD-10-CM | POA: Diagnosis not present

## 2021-01-08 DIAGNOSIS — R079 Chest pain, unspecified: Secondary | ICD-10-CM | POA: Diagnosis not present

## 2021-01-08 DIAGNOSIS — E785 Hyperlipidemia, unspecified: Secondary | ICD-10-CM | POA: Diagnosis not present

## 2021-01-08 DIAGNOSIS — J449 Chronic obstructive pulmonary disease, unspecified: Secondary | ICD-10-CM | POA: Diagnosis not present

## 2021-01-08 DIAGNOSIS — I10 Essential (primary) hypertension: Secondary | ICD-10-CM

## 2021-01-08 DIAGNOSIS — I4892 Unspecified atrial flutter: Secondary | ICD-10-CM | POA: Diagnosis not present

## 2021-01-08 LAB — BASIC METABOLIC PANEL
Anion gap: 8 (ref 5–15)
BUN: 39 mg/dL — ABNORMAL HIGH (ref 8–23)
CO2: 23 mmol/L (ref 22–32)
Calcium: 8.9 mg/dL (ref 8.9–10.3)
Chloride: 109 mmol/L (ref 98–111)
Creatinine, Ser: 2.03 mg/dL — ABNORMAL HIGH (ref 0.61–1.24)
GFR, Estimated: 35 mL/min — ABNORMAL LOW (ref >60)
Glucose, Bld: 104 mg/dL — ABNORMAL HIGH (ref 70–99)
Potassium: 4.4 mmol/L (ref 3.5–5.1)
Sodium: 140 mmol/L (ref 135–145)

## 2021-01-08 LAB — CBC
HCT: 35.8 % — ABNORMAL LOW (ref 39.0–52.0)
Hemoglobin: 11 g/dL — ABNORMAL LOW (ref 13.0–17.0)
MCH: 26.6 pg (ref 26.0–34.0)
MCHC: 30.7 g/dL (ref 30.0–36.0)
MCV: 86.7 fL (ref 80.0–100.0)
Platelets: 201 10*3/uL (ref 150–400)
RBC: 4.13 MIL/uL — ABNORMAL LOW (ref 4.22–5.81)
RDW: 15.2 % (ref 11.5–15.5)
WBC: 4.9 10*3/uL (ref 4.0–10.5)
nRBC: 0 % (ref 0.0–0.2)

## 2021-01-08 LAB — GLUCOSE, CAPILLARY
Glucose-Capillary: 152 mg/dL — ABNORMAL HIGH (ref 70–99)
Glucose-Capillary: 138 mg/dL — ABNORMAL HIGH (ref 70–99)

## 2021-01-08 LAB — CBG MONITORING, ED
Glucose-Capillary: 100 mg/dL — ABNORMAL HIGH (ref 70–99)
Glucose-Capillary: 171 mg/dL — ABNORMAL HIGH (ref 70–99)

## 2021-01-08 LAB — TROPONIN I (HIGH SENSITIVITY): Troponin I (High Sensitivity): 14 ng/L (ref <18)

## 2021-01-08 MED ORDER — MECLIZINE HCL 25 MG PO TABS
25.0000 mg | ORAL_TABLET | Freq: Three times a day (TID) | ORAL | Status: DC | PRN
  Filled 2021-01-08: qty 1

## 2021-01-08 MED ORDER — HYDRALAZINE HCL 50 MG PO TABS
100.0000 mg | ORAL_TABLET | Freq: Three times a day (TID) | ORAL | Status: DC
  Administered 2021-01-08 – 2021-01-09 (×3): 100 mg via ORAL
  Filled 2021-01-08 (×3): qty 2

## 2021-01-08 MED ORDER — MOMETASONE FURO-FORMOTEROL FUM 200-5 MCG/ACT IN AERO
2.0000 | INHALATION_SPRAY | Freq: Two times a day (BID) | RESPIRATORY_TRACT | Status: DC
  Administered 2021-01-08 – 2021-01-09 (×3): 2 via RESPIRATORY_TRACT
  Filled 2021-01-08: qty 8.8

## 2021-01-08 MED ORDER — ALBUTEROL SULFATE (2.5 MG/3ML) 0.083% IN NEBU: 3.0000 mL | INHALATION_SOLUTION | Freq: Four times a day (QID) | RESPIRATORY_TRACT | Status: DC | PRN

## 2021-01-08 MED ORDER — PANTOPRAZOLE SODIUM 40 MG PO TBEC
40.0000 mg | DELAYED_RELEASE_TABLET | Freq: Every day | ORAL | Status: DC
  Administered 2021-01-08 – 2021-01-09 (×2): 40 mg via ORAL
  Filled 2021-01-08 (×2): qty 1

## 2021-01-08 MED ORDER — LORATADINE 10 MG PO TABS
10.0000 mg | ORAL_TABLET | Freq: Every day | ORAL | Status: DC
  Administered 2021-01-08 – 2021-01-09 (×2): 10 mg via ORAL
  Filled 2021-01-08 (×2): qty 1

## 2021-01-08 MED ORDER — TIOTROPIUM BROMIDE MONOHYDRATE 18 MCG IN CAPS
18.0000 ug | ORAL_CAPSULE | Freq: Every day | RESPIRATORY_TRACT | Status: DC
  Administered 2021-01-08 – 2021-01-09 (×2): 18 ug via RESPIRATORY_TRACT
  Filled 2021-01-08: qty 5

## 2021-01-08 MED ORDER — ROSUVASTATIN CALCIUM 20 MG PO TABS: 10.0000 mg | ORAL_TABLET | Freq: Every day | ORAL | Status: DC

## 2021-01-08 MED ORDER — IPRATROPIUM-ALBUTEROL 0.5-2.5 (3) MG/3ML IN SOLN
3.0000 mL | Freq: Four times a day (QID) | RESPIRATORY_TRACT | Status: DC | PRN
  Administered 2021-01-09: 3 mL via RESPIRATORY_TRACT
  Filled 2021-01-08: qty 3

## 2021-01-08 MED ORDER — INSULIN GLARGINE-YFGN 100 UNIT/ML ~~LOC~~ SOLN
26.0000 [IU] | Freq: Every day | SUBCUTANEOUS | Status: DC
  Administered 2021-01-08 – 2021-01-09 (×2): 26 [IU] via SUBCUTANEOUS
  Filled 2021-01-08 (×2): qty 0.26

## 2021-01-08 MED ORDER — FUROSEMIDE 40 MG PO TABS
40.0000 mg | ORAL_TABLET | Freq: Every day | ORAL | Status: DC
  Administered 2021-01-08 – 2021-01-09 (×2): 40 mg via ORAL
  Filled 2021-01-08 (×2): qty 1

## 2021-01-08 MED ORDER — ROSUVASTATIN CALCIUM 10 MG PO TABS
10.0000 mg | ORAL_TABLET | Freq: Every day | ORAL | Status: DC
  Administered 2021-01-08: 10 mg via ORAL
  Filled 2021-01-08 (×2): qty 1

## 2021-01-08 MED ORDER — APIXABAN 5 MG PO TABS
5.0000 mg | ORAL_TABLET | Freq: Two times a day (BID) | ORAL | Status: DC
  Administered 2021-01-08 – 2021-01-09 (×3): 5 mg via ORAL
  Filled 2021-01-08 (×3): qty 1

## 2021-01-08 MED ORDER — GLIMEPIRIDE 2 MG PO TABS
2.0000 mg | ORAL_TABLET | Freq: Every day | ORAL | Status: DC
  Administered 2021-01-09: 2 mg via ORAL
  Filled 2021-01-08: qty 1

## 2021-01-08 MED ORDER — ESCITALOPRAM OXALATE 10 MG PO TABS
10.0000 mg | ORAL_TABLET | Freq: Every day | ORAL | Status: DC
  Administered 2021-01-08 – 2021-01-09 (×2): 10 mg via ORAL
  Filled 2021-01-08 (×2): qty 1

## 2021-01-08 MED ORDER — ISOSORBIDE MONONITRATE ER 30 MG PO TB24
30.0000 mg | ORAL_TABLET | Freq: Two times a day (BID) | ORAL | Status: DC
  Administered 2021-01-08 – 2021-01-09 (×3): 30 mg via ORAL
  Filled 2021-01-08 (×3): qty 1

## 2021-01-08 MED ORDER — GABAPENTIN 100 MG PO CAPS
100.0000 mg | ORAL_CAPSULE | Freq: Three times a day (TID) | ORAL | Status: DC
  Administered 2021-01-08 – 2021-01-09 (×3): 100 mg via ORAL
  Filled 2021-01-08 (×3): qty 1

## 2021-01-08 MED ORDER — POTASSIUM CHLORIDE CRYS ER 20 MEQ PO TBCR
20.0000 meq | EXTENDED_RELEASE_TABLET | Freq: Two times a day (BID) | ORAL | Status: DC
  Administered 2021-01-08 – 2021-01-09 (×3): 20 meq via ORAL
  Filled 2021-01-08 (×3): qty 1

## 2021-01-08 MED ORDER — CARVEDILOL 12.5 MG PO TABS
12.5000 mg | ORAL_TABLET | Freq: Two times a day (BID) | ORAL | Status: DC
  Administered 2021-01-08 – 2021-01-09 (×3): 12.5 mg via ORAL
  Filled 2021-01-08: qty 1
  Filled 2021-01-08: qty 2
  Filled 2021-01-08: qty 1

## 2021-01-08 NOTE — Progress Notes (Signed)
Communicated with patient concerning use of hospital bipap/cpap, patient states he needs nasal prongs which we do not have. Patient states he can not tolerate our cpap/bipap mask and states nasal cannula at night will be sufficient. No concerns or worries at this time.

## 2021-01-08 NOTE — ED Notes (Signed)
Informed RN bed assigned 

## 2021-01-08 NOTE — Plan of Care (Signed)
  Problem: Education: Goal: Knowledge of General Education information will improve Description: Including pain rating scale, medication(s)/side effects and non-pharmacologic comfort measures 01/08/2021 1703 by Cristela Blue, RN Outcome: Progressing 01/08/2021 1703 by Cristela Blue, RN Outcome: Progressing   Problem: Health Behavior/Discharge Planning: Goal: Ability to manage health-related needs will improve 01/08/2021 1703 by Cristela Blue, RN Outcome: Progressing 01/08/2021 1703 by Cristela Blue, RN Outcome: Progressing   Problem: Clinical Measurements: Goal: Ability to maintain clinical measurements within normal limits will improve 01/08/2021 1703 by Cristela Blue, RN Outcome: Progressing 01/08/2021 1703 by Cristela Blue, RN Outcome: Progressing Goal: Will remain free from infection 01/08/2021 1703 by Cristela Blue, RN Outcome: Progressing 01/08/2021 1703 by Cristela Blue, RN Outcome: Progressing Goal: Diagnostic test results will improve 01/08/2021 1703 by Cristela Blue, RN Outcome: Progressing 01/08/2021 1703 by Cristela Blue, RN Outcome: Progressing Goal: Respiratory complications will improve 01/08/2021 1703 by Cristela Blue, RN Outcome: Progressing 01/08/2021 1703 by Cristela Blue, RN Outcome: Progressing Goal: Cardiovascular complication will be avoided 01/08/2021 1703 by Cristela Blue, RN Outcome: Progressing 01/08/2021 1703 by Cristela Blue, RN Outcome: Progressing   Problem: Activity: Goal: Risk for activity intolerance will decrease 01/08/2021 1703 by Cristela Blue, RN Outcome: Progressing 01/08/2021 1703 by Cristela Blue, RN Outcome: Progressing   Problem: Nutrition: Goal: Adequate nutrition will be maintained 01/08/2021 1703 by Cristela Blue, RN Outcome: Progressing 01/08/2021 1703 by Cristela Blue, RN Outcome: Progressing   Problem: Coping: Goal: Level of anxiety will decrease 01/08/2021 1703 by Cristela Blue, RN Outcome:  Progressing 01/08/2021 1703 by Cristela Blue, RN Outcome: Progressing   Problem: Elimination: Goal: Will not experience complications related to bowel motility 01/08/2021 1703 by Cristela Blue, RN Outcome: Progressing 01/08/2021 1703 by Cristela Blue, RN Outcome: Progressing Goal: Will not experience complications related to urinary retention 01/08/2021 1703 by Cristela Blue, RN Outcome: Progressing 01/08/2021 1703 by Cristela Blue, RN Outcome: Progressing   Problem: Pain Managment: Goal: General experience of comfort will improve 01/08/2021 1703 by Cristela Blue, RN Outcome: Progressing 01/08/2021 1703 by Cristela Blue, RN Outcome: Progressing   Problem: Safety: Goal: Ability to remain free from injury will improve 01/08/2021 1703 by Cristela Blue, RN Outcome: Progressing 01/08/2021 1703 by Cristela Blue, RN Outcome: Progressing   Problem: Skin Integrity: Goal: Risk for impaired skin integrity will decrease 01/08/2021 1703 by Cristela Blue, RN Outcome: Progressing 01/08/2021 1703 by Cristela Blue, RN Outcome: Progressing   Problem: Education: Goal: Ability to demonstrate management of disease process will improve 01/08/2021 1703 by Cristela Blue, RN Outcome: Progressing 01/08/2021 1703 by Cristela Blue, RN Outcome: Progressing Goal: Ability to verbalize understanding of medication therapies will improve 01/08/2021 1703 by Cristela Blue, RN Outcome: Progressing 01/08/2021 1703 by Cristela Blue, RN Outcome: Progressing Goal: Individualized Educational Video(s) 01/08/2021 1703 by Cristela Blue, RN Outcome: Progressing 01/08/2021 1703 by Cristela Blue, RN Outcome: Progressing   Problem: Activity: Goal: Capacity to carry out activities will improve 01/08/2021 1703 by Cristela Blue, RN Outcome: Progressing 01/08/2021 1703 by Cristela Blue, RN Outcome: Progressing   Problem: Cardiac: Goal: Ability to achieve and maintain adequate cardiopulmonary perfusion will  improve 01/08/2021 1703 by Cristela Blue, RN Outcome: Progressing 01/08/2021 1703 by Cristela Blue, RN Outcome: Progressing

## 2021-01-08 NOTE — Progress Notes (Signed)
[facility] [place] NAME: Larry Callahan    MR#:  [id number]  DATE OF BIRTH:  [birth year]  SUBJECTIVE:   patient came in with dizziness, chest pain. He was found to be in a fib. Heart rate appears controlled. Patient takes meclizine as needed for dizziness at home. Denies any falls. Uses walker at home. REVIEW OF SYSTEMS:   Review of Systems  Constitutional:  Negative for chills, fever and weight loss.  HENT:  Negative for ear discharge, ear pain and nosebleeds.   Eyes:  Negative for blurred vision, pain and discharge.  Respiratory:  Negative for sputum production, shortness of breath, wheezing and stridor.   Cardiovascular:  Negative for chest pain, palpitations, orthopnea and PND.  Gastrointestinal:  Negative for abdominal pain, diarrhea, nausea and vomiting.  Genitourinary:  Negative for frequency and urgency.  Musculoskeletal:  Negative for back pain and joint pain.  Neurological:  Positive for dizziness and weakness. Negative for sensory change, speech change and focal weakness.  Psychiatric/Behavioral:  Negative for depression and hallucinations. The patient is not nervous/anxious.   Tolerating Diet:yes Tolerating PT:   DRUG ALLERGIES:   Allergies  Allergen Reactions  . Penicillins Rash    VITALS:  Blood pressure 135/72, pulse 70, temperature 98.2 F (36.8 C), temperature source Oral, resp. rate (!) 22, SpO2 96 %.  PHYSICAL EXAMINATION:   Physical Exam  GENERAL:  65 y.o.-year-old patient lying in the bed with no acute distress. Morbidly obese LUNGS: Normal breath sounds bilaterally, no wheezing, rales, rhonchi. No use of accessory muscles of respiration.  CARDIOVASCULAR: S1, S2 normal. No murmurs, rubs, or gallops. irregular ABDOMEN: Soft, nontender, nondistended. Bowel sounds present. No organomegaly or mass. Abd obesity EXTREMITIES: No cyanosis, clubbing or edema b/l.    NEUROLOGIC: Cranial nerves II through XII are  intact. No focal Motor or sensory deficits b/l.  No focal weakness. No nystagmus PSYCHIATRIC:  patient is alert and oriented x 3.  SKIN: No obvious rash, lesion, or ulcer.   LABORATORY PANEL:  CBC Recent Labs  Lab 01/08/21 0702  WBC 4.9  HGB 11.0*  HCT 35.8*  PLT 201    Chemistries  Recent Labs  Lab 01/08/21 0702  NA 140  K 4.4  CL 109  CO2 23  GLUCOSE 104*  BUN 39*  CREATININE 2.03*  CALCIUM 8.9   Cardiac Enzymes No results for input(s): TROPONINI in the last 168 hours. RADIOLOGY:  DG Chest 2 View  Result Date: 01/07/2021 CLINICAL DATA:  Chest pain EXAM: CHEST - 2 VIEW COMPARISON:  11/24/2020 FINDINGS: Heart is upper limits normal in size. Lungs clear. No effusions or edema. No acute bony abnormality. IMPRESSION: No active cardiopulmonary disease. Electronically Signed   By: Rolm Baptise M.D.   On: 01/07/2021 10:59   CT Angio Chest PE W and/or Wo Contrast  Result Date: 01/07/2021 CLINICAL DATA:  PE suspected, high prob EXAM: CT ANGIOGRAPHY CHEST WITH CONTRAST TECHNIQUE: Multidetector CT imaging of the chest was performed using the standard protocol during bolus administration of intravenous contrast. Multiplanar CT image reconstructions and MIPs were obtained to evaluate the vascular anatomy. CONTRAST:  69mL OMNIPAQUE IOHEXOL 350 MG/ML SOLN COMPARISON:  Oct 30, 2020 FINDINGS: Cardiovascular: Evaluation is limited secondary to suboptimal contrast opacification, artifact and respiratory motion. No central pulmonary embolism. No definitive acute pulmonary embolism through the proximal segmental pulmonary arteries. There are predominately LEFT-sided coronary artery atherosclerotic calcifications. No pericardial effusion. Heart is at the upper limits of normal  in size. LEFT vertebral artery arises directly from the aorta. Scattered atherosclerotic calcifications throughout the aorta. Mediastinum/Nodes: No axillary or mediastinal adenopathy. Thyroid is unremarkable. Lungs/Pleura:  Small LEFT pleural effusion. LEFT basilar ground-glass opacity. Evaluation of parenchymal detail is limited due to extensive respiratory motion. Upper Abdomen: Splenomegaly with multiple coarse calcifications likely reflecting sequela of prior granulomatous infection. Renal cysts. Musculoskeletal: Degenerative changes of the thoracic spine. Gynecomastia. Review of the MIP images confirms the above findings. IMPRESSION: 1. No central pulmonary embolism within the limitations of this exam. If persistent concern consider dedicated lower extremity ultrasound or V/Q scan. 2. Small LEFT pleural effusion. 3. LEFT basilar ground-glass opacity, nonspecific with differential considerations including infection, aspiration or atelectasis. Aortic Atherosclerosis (ICD10-I70.0). Electronically Signed   By: Valentino Saxon MD   On: 01/07/2021 15:17   ASSESSMENT AND PLAN:   Larry Callahan is a 65 y.o. male with medical history significant for morbid obesity, CAD, CKD 3B, HTN, COPD, OSA, chronic systolic heart failure, last EF 35 to 40% in April 2022 with a moderate risk Myoview 09/2020, chest pain admission in May 2022 with unremarkable work-up and syncope admission June 2022 with negative work-up who now presents to the ED with nonradiating substernal chest pain that started on the morning of arrival associated with lightheadednes 65 year old male with history of morbid obesity, CAD CKD 3B, HTN, COPD, OSA, chronic systolic heart failure, presenting with chest pain and shortness of breath.  Found to be in new onset A. fib with RVR, controlled while in the ED, but with persistent lightheadedness and headache.     Chest pain   New onset atrial flutter ([place]) -Patient with chest pain but with negative troponins of 12-14 and with EKG nonacute - Hospitalized with chest pain 2 months prior with negative cardiac work-up, - Moderate risk Myoview 09/2020 - Rapid A. fib in the ED to 122 controlled with Coreg - Rate  controlled on Coreg, increased to 12.5 mg twice daily per Dr. Rockey Situ - Started on Eliquis - Cardiology consult noted--no new recs other than f/u in office for possible DCCV  Lightheadedness/headache - Lightheadedness persistent in spite of control of heart rate - Negative syncope work-up in June 2022 - Neuro exam nonfocal - MRI brian in June neg--possible vertigo. Pt on chronic meclizine for it. Neurology to see pt. Will try vestibular PT     Chronic systolic CHF  - Echocardiogram 4/22 with EF 35 to 40% - Continue carvedilol, furosemide, Imdur.  Not currently on ACE/ARB likely related to renal function  Obstructive sleep apnea, adult - CPAP    COPD - Not acutely exacerbated - Albuterol as needed, home inhalers     Essential hypertension -Continue hydralazine  Diabetes - Sliding scale insulin coverage     Stage 3b chronic kidney disease ([place]) - Renal function at baseline     DVT prophylaxis: eliquis  Code Status: full code  Family Communication:  none  Disposition Plan: Back to previous home environment Consults called: cardiology , neuro Level of care: Progressive Cardiac Status is: Observation  The patient remains OBS appropriate and will d/c before 2 midnights.  Dispo: The patient is from: Home              Anticipated d/c is to: Home              Patient currently is medically stable to d/c.   Difficult to place patient No        TOTAL TIME TAKING CARE OF THIS PATIENT:  25 minutes.  >50% time spent on counselling and coordination of care  Note: This dictation was prepared with Dragon dictation along with smaller phrase technology. Any transcriptional errors that result from this process are unintentional.  Fritzi Mandes M.D    [facility]   CC: Primary care physician; Lavera Guise, MD Patient ID: Larry Callahan, male   DOB: 11/18/1955, 65 y.o.   MRN: [id number]

## 2021-01-08 NOTE — Consult Note (Signed)
Neurology Consultation Reason for Consult:  Requesting Physician: Fritzi Mandes   CC: " I was doing really terrible earlier today but I am doing fine now"  History is obtained from: Patient and chart review  HPI: Larry Callahan is a 65 y.o. male with a past medical history significant for hypertension, hyperlipidemia, diabetes (A1c 7.6% 09/2020), TIA (09/2020), obesity (BMI 36.81), coronary artery disease, chronic kidney disease stage IIIb (baseline creatinine 2-2.3, GFR ~30), left nephrectomy, chronic systolic heart failure (EF 35 to 40% 09/2020), COPD, prior smoking, obstructive sleep apnea with frequent hospital admissions (chest pain in May 2022, syncope in June 2022) who presented again with chest pain and was found to have A. fib with RVR (new diagnosis) as well as a left basilar groundglass opacity.  Regarding his dizziness, he reports that it is gone, that it is still slightly present, and generally provides an inconsistent history with inability to characterize what might trigger it and exactly how he experiences the dizziness.  However he remembers prior hospitalizations quite well discussing with the bedside technician how he had stumbled over a trash can left by housekeeping in the middle of the floor on his last admission etc.  which is corroborated by the technician.  He reports he feels ready to go home and is not sure why he was having trouble standing earlier today.  In my review of systems he denies any acute complaints  Last seen by neurology in 09/2020 as a code stroke for right-sided numbness with negative MRI brain and symptoms resolving the next day.  Patient also had a headache at the time but without prior history of complex migraines.  Etiology was felt to be TIA.  Notably neurological examination was normal.  Cardiology today was concerned about the possibility of stroke given ongoing dizziness, and additionally on physical therapy evaluation today "Pt rises to standing with  for orthos BP, but is limited by a combination of both BLE tremulation and pants falling down. Pt reports concern of legs giving out. Pt previously able to AMB >346f at 2 prior admissions, no clear explanation as to why pt is having such significant leg weakness this date. "  Regarding meclizine, this was apparently started on 11/14/2020   ROS: All other review of systems was negative except as noted in the HPI.   Past Medical History:  Diagnosis Date   Allergy    Chronic combined systolic (congestive) and diastolic (congestive) heart failure ([facility]    a. 04/2019 Echo: EF 45-50%; b. 02/2019 Echo: EF 55-60%; c. 09/2020 Echo: EF 35-40%, glob HK. Nl RV size/fxn. Mild MR.   CKD (chronic kidney disease), stage III (H[facility])    COPD (chronic obstructive pulmonary disease) ([facility]    Coronary artery disease    a. 2011 Cath: nonobs dzs; b. 09/2020 MV: EF 38%. No signif ischemia. ? inf MI vs diaph attenuation. GI uptake artifact.   Diabetes mellitus without complication ([facility]    GERD (gastroesophageal reflux disease)    Hyperlipidemia    Hypertension    Morbid obesity ([facility]    NICM (nonischemic cardiomyopathy) ([facility]    a. 04/2019 Echo: EF 45-50%; b. 02/2019 Echo: EF 55-60%; c. 09/2020 Echo: EF 35-40%, glob HK; d. 09/2020 MV: No ischemia. ? inf infarct vs attenuation.   OSA (obstructive sleep apnea)    Past Surgical History:  Procedure Laterality Date   BACK SURGERY     CARDIAC CATHETERIZATION     CHOLECYSTECTOMY     COLONOSCOPY WITH PROPOFOL N/A 04/12/2019  Procedure: COLONOSCOPY WITH PROPOFOL;  Surgeon: [name], MD;  Location: [facility] ENDOSCOPY;  Service: Gastroenterology;  Laterality: N/A;   ESOPHAGOGASTRODUODENOSCOPY N/A 04/12/2019   Procedure: ESOPHAGOGASTRODUODENOSCOPY (EGD);  Surgeon: [name], MD;  Location: [facility] ENDOSCOPY;  Service: Gastroenterology;  Laterality: N/A;   ESOPHAGOGASTRODUODENOSCOPY (EGD) WITH PROPOFOL N/A 11/21/2019   Procedure: ESOPHAGOGASTRODUODENOSCOPY (EGD)  WITH PROPOFOL;  Surgeon: [name], MD;  Location: [facility] ENDOSCOPY;  Service: Gastroenterology;  Laterality: N/A;   FLEXIBLE BRONCHOSCOPY Bilateral 05/17/2019   Procedure: FLEXIBLE BRONCHOSCOPY;  Surgeon: [name], MD;  Location: [name] ORS;  Service: Pulmonary;  Laterality: Bilateral;   left arm surgery     nephrectomy Left    PARATHYROIDECTOMY     RIGHT HEART CATH N/A 03/29/2019   Procedure: RIGHT HEART CATH;  Surgeon: [name], MD;  Location: [facility] CV LAB;  Service: Cardiovascular;  Laterality: N/A;   Current Outpatient Medications  Medication Instructions   albuterol (VENTOLIN HFA) 108 (90 Base) MCG/ACT inhaler 2 puffs, Inhalation, Every 6 hours PRN   apixaban (ELIQUIS) 2.5 mg, Oral, 2 times daily   Blood Glucose Calibration (TRUE METRIX LEVEL 1) Low SOLN Use as directed   Blood Glucose Monitoring Suppl (TRUE METRIX AIR GLUCOSE METER) DEVI 1 Device, Does not apply, 2 times daily   budesonide-formoterol (SYMBICORT) 160-4.5 MCG/ACT inhaler 2 puffs, Inhalation, 2 times daily   carvedilol (COREG) 12.5 mg, Oral, 2 times daily   cetirizine (ZYRTEC) 10 mg, Oral, Daily   doxycycline (VIBRA-TABS) 100 MG tablet Take one tab po BID x 1 week. Then decrease to QD thereafter. Take with food.   escitalopram (LEXAPRO) 10 MG tablet Take one tab po qd for anxiety with food   ferrous gluconate (FERGON) 324 mg, Oral, Daily with breakfast   furosemide (LASIX) 40 MG tablet Take one to tabs once or twice days for fluid // swelling   gabapentin (NEURONTIN) 100 mg, Oral, 3 times daily   glimepiride (AMARYL) 2 MG tablet Take one tab po qd with supper   glucose blood (TRUE METRIX BLOOD GLUCOSE TEST) test strip Use as instructed twice daily diag E11.65    hydrALAZINE (APRESOLINE) 100 mg, Oral, 3 times daily   insulin glargine (1 Unit Dial) (TOUJEO) 26 Units, Subcutaneous, Daily   ipratropium-albuterol (DUONEB) 0.5-2.5 (3) MG/3ML SOLN 3 mLs, Nebulization, Every 6 hours PRN    isosorbide mononitrate (IMDUR) 30 mg, Oral, 2 times daily   meclizine (ANTIVERT) 25 mg, Oral, 3 times daily PRN   NOVOFINE PEN NEEDLE 32G X 6 MM MISC Use as directed   omeprazole (PRILOSEC) 40 mg, Oral, 2 times daily   potassium chloride SA (KLOR-CON) 20 MEQ tablet 20 mEq, Oral, 2 times daily   rosuvastatin (CRESTOR) 10 mg, Oral, Daily at bedtime   Spiriva HandiHaler 18 mcg, Inhalation, Daily   TRUEplus Lancets 33G MISC Use as directed twice daily diag E11.65   -Note, apixaban started this admission   Current Facility-Administered Medications:    acetaminophen (TYLENOL) tablet 650 mg, 650 mg, Oral, Q6H PRN **OR** acetaminophen (TYLENOL) suppository 650 mg, 650 mg, Rectal, Q6H PRN, Athena Masse, MD   acetaminophen (TYLENOL) tablet 650 mg, 650 mg, Oral, Q4H PRN, Judd Gaudier V, MD, 650 mg at 01/07/21 2237   albuterol (PROVENTIL) (2.5 MG/3ML) 0.083% nebulizer solution 3 mL, 3 mL, Inhalation, Q6H PRN, Fritzi Mandes, MD   ALPRAZolam Duanne Moron) tablet 0.25 mg, 0.25 mg, Oral, BID PRN, Athena Masse, MD   apixaban (ELIQUIS) tablet 5 mg, 5 mg, Oral,  BID, Theora Gianotti, NP, 5 mg at 01/08/21 1206   carvedilol (COREG) tablet 12.5 mg, 12.5 mg, Oral, BID WC, Theora Gianotti, NP, 12.5 mg at 01/08/21 1722   escitalopram (LEXAPRO) tablet 10 mg, 10 mg, Oral, Daily, Fritzi Mandes, MD, 10 mg at 01/08/21 1722   furosemide (LASIX) tablet 40 mg, 40 mg, Oral, Daily, Fritzi Mandes, MD, 40 mg at 01/08/21 1501   gabapentin (NEURONTIN) capsule 100 mg, 100 mg, Oral, TID, Fritzi Mandes, MD, 100 mg at 01/08/21 1722   [START ON 01/09/2021] glimepiride (AMARYL) tablet 2 mg, 2 mg, Oral, Q breakfast, Fritzi Mandes, MD   hydrALAZINE (APRESOLINE) tablet 100 mg, 100 mg, Oral, TID, Fritzi Mandes, MD, 100 mg at 01/08/21 1722   HYDROcodone-acetaminophen (NORCO/VICODIN) 5-325 MG per tablet 1-2 tablet, 1-2 tablet, Oral, Q4H PRN, Athena Masse, MD   insulin aspart (novoLOG) injection 0-15 Units, 0-15 Units, Subcutaneous,  TID WC, Athena Masse, MD, 3 Units at 01/08/21 1723   insulin aspart (novoLOG) injection 0-5 Units, 0-5 Units, Subcutaneous, QHS, Duncan, Hazel V, MD   insulin glargine-yfgn (SEMGLEE) injection 26 Units, 26 Units, Subcutaneous, Daily, Fritzi Mandes, MD, 26 Units at 01/08/21 1723   ipratropium-albuterol (DUONEB) 0.5-2.5 (3) MG/3ML nebulizer solution 3 mL, 3 mL, Nebulization, Q6H PRN, Fritzi Mandes, MD   isosorbide mononitrate (IMDUR) 24 hr tablet 30 mg, 30 mg, Oral, BID, Fritzi Mandes, MD, 30 mg at 01/08/21 1501   loratadine (CLARITIN) tablet 10 mg, 10 mg, Oral, Daily, Fritzi Mandes, MD, 10 mg at 01/08/21 1501   meclizine (ANTIVERT) tablet 25 mg, 25 mg, Oral, TID PRN, Fritzi Mandes, MD   mometasone-formoterol [facility]) 200-5 MCG/ACT inhaler 2 puff, 2 puff, Inhalation, BID, Fritzi Mandes, MD, 2 puff at 01/08/21 1451   ondansetron (ZOFRAN) tablet 4 mg, 4 mg, Oral, Q6H PRN **OR** ondansetron (ZOFRAN) injection 4 mg, 4 mg, Intravenous, Q6H PRN, Athena Masse, MD   pantoprazole (PROTONIX) EC tablet 40 mg, 40 mg, Oral, Daily, Fritzi Mandes, MD, 40 mg at 01/08/21 1501   potassium chloride SA (KLOR-CON) CR tablet 20 mEq, 20 mEq, Oral, BID, Fritzi Mandes, MD, 20 mEq at 01/08/21 1502   rosuvastatin (CRESTOR) tablet 10 mg, 10 mg, Oral, QHS, Fritzi Mandes, MD   senna-docusate (Senokot-S) tablet 1 tablet, 1 tablet, Oral, QHS PRN, Athena Masse, MD   tiotropium [facility]) inhalation capsule ([name] use ONLY) 18 mcg, 18 mcg, Inhalation, Daily, Fritzi Mandes, MD, 18 mcg at 01/08/21 1450   Family History  Problem Relation Age of Onset   Diabetes Mother        2003   Lung cancer Father    Diabetes Sister    Hypertension Sister    Heart disease Sister    Cancer Sister    Bone cancer Brother     Social History:  reports that he has quit smoking. His smoking use included cigarettes. He has quit using smokeless tobacco.  His smokeless tobacco use included chew. He reports that he does not drink alcohol and does not use  drugs.   Exam: Current vital signs: BP (!) 158/89 (BP Location: Right Arm)   Pulse 69   Temp 98 F (36.7 C) (Oral)   Resp 17   Ht _0  (1.803 m)   Wt 119.7 kg   SpO2 99%   BMI 36.81 kg/m  Vital signs in last 24 hours: Temp:  [98 F (36.7 C)-98.4 F (36.9 C)] 98 F (36.7 C) (07/29 1432) Pulse Rate:  [65-76] 69 (07/29 1432) Resp:  [  16-29] 17 (07/29 1432) BP: (132-182)/(68-96) 158/89 (07/29 1432) SpO2:  [92 %-99 %] 99 % (07/29 1432) Weight:  [119.7 kg] 119.7 kg (07/29 1432)   Physical Exam  Constitutional: Appears mildly chronically ill, overweight Psych: Affect appropriate to situation, calm and cooperative Eyes: No scleral injection HENT: No oropharyngeal obstruction.  MSK: no joint deformities.  Cardiovascular: Irregularly irregular Respiratory: Wheezing at rest in bed GI: Soft.  No distension. There is no tenderness.  Skin: Warm dry and intact visible skin  Neuro: Mental Status: Patient is awake, alert, oriented to person, place, month, year, and situation. Patient is able to give a clear and coherent history about topics he is interested in discussing. No signs of aphasia or neglect Cranial Nerves: II: Visual Fields are full. Pupils are equal, round, and reactive to light.   III,IV, VI: EOMI without ptosis or diploplia.  Though pursuits are saccadic and he requires multiple reminders to keep his head still and moving his eyes.  Cannot fully exclude mild nystagmus is secondary to the patient having difficulty remembering to maintain fixation V: Facial sensation is symmetric to light touch VII: Facial movement is symmetric.  VIII: hearing is intact to voice X: Uvula elevates symmetrically though only partially visualized XI: Shoulder shrug is symmetric. XII: tongue is midline without atrophy or fasciculations.  Motor: Tone is notable for some paratonia. Bulk is normal. 5/5 strength was present in all four extremities other than pain limited hip flexion  bilaterally 4/5 Sensory: Sensation is symmetric in bilateral arms and symmetric in bilateral legs without neglect Deep Tendon Reflexes: 2+ and symmetric in the biceps and, has a great deal of difficulty relaxing the legs for patellae reflex testing.  Plantars: Toes are downgoing bilaterally.  Cerebellar: FNF and HKS are intact bilaterally Gait: Stands and ambulates without any assistance easily around the room, has to be reminded not to outpace examiner for safety  NIHSS total 0  I have reviewed labs in epic and the results pertinent to this consultation are:  Mild normocytic anemia Creatinine of 2.03 with GFR of 32 SARS-CoV-2 negative/flu negative Troponin stable  I have reviewed the images obtained: EKG with A. fib with RVR (as obtained in the ED)  Lab Results  Component Value Date   VITAMINB12 163 (L) 11/21/2019  B12 368 on 08/11/2019 at [place] was additionally 154 on 04/11/2019   Lab Results  Component Value Date   TSH 1.561 10/06/2020     Impression: Very low concern for any significant acute ischemic stroke given patient appears to be at his neurological baseline at the time of my evaluation.  While I cannot exclude the possibility of a small stroke, given he is already been tolerating apixaban without worsening neurological status, MRI imaging would be unlikely to change my management of this patient at this time.  Notably he has a documented B12 deficiency but this medication is no longer on his chart although it was on his medication list previously.  Despite being on B12 supplementation since 03/2019, there was minimal improvement of his B12 on 11/21/2019, the last time it was tested.  Suspect that he may not have actually been adherent to B12 supplementation, and this is certainly a reversible cause of myelopathy, encephalopathy, dementia which may be playing at least some small role in his frequent presentations  Recommendations: - B12 level, baseline can be  followed up by PCP with - Empiric B12 supplementation 1000 mcg daily x 7 days, then 1000 mcg oral daily  - Neurology  will be available on an as-needed basis going forward.  Please reconsult if new questions arise.  Lesleigh Noe MD-PhD [facility] [phone] [facility] coverage for [facility] is from 8 AM to 4 AM in-house and 4 PM to 8 PM by telephone/video. 8 PM to 8 AM emergent questions or overnight urgent questions should be addressed to Teleneurology On-call or [facility] neurohospitalist; contact information can be found on AMION

## 2021-01-08 NOTE — Evaluation (Addendum)
Physical Therapy Evaluation Patient Details Name: Larry Callahan MRN: [id number] DOB: 07-19-1955 Today's Date: 01/08/2021   History of Present Illness  Larry Callahan is a 41yoM who comes to [facility] on 01/07/21 c CP. PMH: CAD, CKD3b, HTN, COPD, ODA, sCHF c EF35-40%, syncope, no clear etiology determined Pt noted to have new onset AF c RVR.  Clinical Impression  Pt admitted with above diagnosis. Pt currently with functional limitations due to the deficits listed below (see "PT Problem List"). Upon entry, pt in bed, awake and agreeable to participate. The pt is alert, pleasant, and interactive. Pt has impulsivity, tangential thought process and conversation, poor insight into his current deficits and their long-term implications (he does understand his short term demands not being achievable at present). Getting a clear and detailed subjective history is very difficulty due to tangential thoughts, rarely able to provide details as asked. Pt has 5/5 elbows grossly, strong grips bilat, 5/5 knee extension and ankle DF. Pt no longer having CP at present. Pt reports still feeling a little dizzy, reportedly improved since yesterday- he cannot specify other characteristics, timelines, or aggravating factors. Pt rises to standing with for orthos BP, but is limited by a combination of both BLE tremulation and pants falling down. Pt reports concern of legs giving out. Pt previously able to AMB >388ft at 2 prior admissions, no clear explanation as to why pt is having such significant leg weakness this date. Performance this date reveals decreased ability, independence, and tolerance in performing all basic mobility required for performance of activities of daily living. Pt requires additional DME, close physical assistance, and cues for safe participate in mobility. Pt will benefit from skilled PT intervention to increase independence and safety with basic mobility in preparation for discharge to the venue listed  below.     Orthostatic VS for the past 24 hrs (Last 3 readings):  BP- Sitting Pulse- Sitting  01/08/21 1309 168/87 91       Follow Up Recommendations SNF;Supervision for mobility/OOB (pt concerned that STR would be a financial burden.)    Equipment Recommendations  None recommended by PT    Recommendations for Other Services       Precautions / Restrictions Precautions Precautions: Fall Restrictions Weight Bearing Restrictions: No      Mobility  Bed Mobility               General bed mobility comments: seated at entry    Transfers Overall transfer level: Needs assistance   Transfers: Sit to/from Stand Sit to Stand: Min guard         General transfer comment: pulling self up with gurney rail; very weak, labored effort  Ambulation/Gait Ambulation/Gait assistance: Min guard;Min assist Gait Distance (Feet): 20 Feet Assistive device: Rolling walker (2 wheeled) Gait Pattern/deviations: Step-to pattern     General Gait Details: slow, distracted, needs constant cues for safety, holding pants up, safe RW use; Left foot scuffing likely due to Rt hip weakness given physical exam results  Stairs            Wheelchair Mobility    Modified Rankin (Stroke Patients Only)       Balance                                             Pertinent Vitals/Pain Pain Assessment: No/denies pain    Home Living Family/patient expects to  be discharged to:: Private residence Living Arrangements: Alone Available Help at Discharge: Family;Available PRN/intermittently Type of Home: Mobile home Home Access: Stairs to enter Entrance Stairs-Rails: Left Entrance Stairs-Number of Steps: 5 Home Layout: One level Home Equipment: Cane - single point;Bedside commode;Walker - 2 wheels Additional Comments: CPAP    Prior Function Level of Independence: Independent with assistive device(s)         Comments: Uses SPC for functional mobility due to space  constraints in home . Home care nurse comes 1x/month to assists with blood sugar monitoring.     Hand Dominance   Dominant Hand: Right    Extremity/Trunk Assessment   Upper Extremity Assessment Upper Extremity Assessment: Overall WFL for tasks assessed    Lower Extremity Assessment Lower Extremity Assessment: Overall WFL for tasks assessed       Communication      Cognition Arousal/Alertness: Awake/alert Behavior During Therapy: WFL for tasks assessed/performed Overall Cognitive Status: Within Functional Limits for tasks assessed                                        General Comments      Exercises     Assessment/Plan    PT Assessment Patient needs continued PT services  PT Problem List Decreased strength;Decreased cognition;Decreased activity tolerance;Decreased balance;Decreased mobility;Obesity;Cardiopulmonary status limiting activity       PT Treatment Interventions DME instruction;Gait training;Stair training;Functional mobility training;Therapeutic activities;Therapeutic exercise;Patient/family education    PT Goals (Current goals can be found in the Care Plan section)  Acute Rehab PT Goals Patient Stated Goal: regain baseline activity tolerance PT Goal Formulation: With patient Time For Goal Achievement: 01/22/21 Potential to Achieve Goals: Fair    Frequency Min 2X/week   Barriers to discharge Decreased caregiver support unable to AMB household distances; stairs to enter    Co-evaluation               AM-PAC PT "6 Clicks" Mobility  Outcome Measure Help needed turning from your back to your side while in a flat bed without using bedrails?: A Lot Help needed moving from lying on your back to sitting on the side of a flat bed without using bedrails?: A Lot Help needed moving to and from a bed to a chair (including a wheelchair)?: A Little Help needed standing up from a chair using your arms (e.g., wheelchair or bedside chair)?: A  Little Help needed to walk in hospital room?: A Lot Help needed climbing 3-5 steps with a railing? : A Lot 6 Click Score: 14    End of Session   Activity Tolerance: Patient limited by fatigue;No increased pain;Treatment limited secondary to medical complications (Comment) (unexplained leg weakness) Patient left: in chair;with nursing/sitter in room;with call bell/phone within reach Nurse Communication: Mobility status PT Visit Diagnosis: Unsteadiness on feet (R26.81);Difficulty in walking, not elsewhere classified (R26.2);Muscle weakness (generalized) (M62.81)    Time: 6503-5465 PT Time Calculation (min) (ACUTE ONLY): 22 min   Charges:   PT Evaluation $PT Eval Moderate Complexity: 1 Mod        2:10 PM, 01/08/21 Etta Grandchild, PT, DPT Physical Therapist - [facility]  [phone] ([place])    [place] 01/08/2021, 1:56 PM

## 2021-01-08 NOTE — Consult Note (Signed)
Cardiology Consult    Patient ID: Larry Callahan MRN: [id number], DOB/AGE: Oct 31, 1955   Admit date: 01/07/2021 Date of Consult: 01/08/2021  Primary Physician: Lavera Guise, MD Primary Cardiologist: [name], MD Requesting Provider: Chauncey Cruel. Posey Pronto, MD  Patient Profile    Larry Callahan is a 65 y.o. male with a history of chronic combined systolic and diastolic congestive heart failure, nonischemic cardiomyopathy, stage III chronic kidney disease, COPD, nonobstructive CAD, diabetes, GERD, hypertension, hyperlipidemia, obesity, and sleep apnea, who is being seen today for the evaluation of atrial flutter at the request of [name].  Past Medical History   Past Medical History:  Diagnosis Date   Allergy    Chronic combined systolic (congestive) and diastolic (congestive) heart failure ([place])    a. 04/2019 Echo: EF 45-50%; b. 02/2019 Echo: EF 55-60%; c. 09/2020 Echo: EF 35-40%, glob HK. Nl RV size/fxn. Mild MR.   CKD (chronic kidney disease), stage III (HCC)    COPD (chronic obstructive pulmonary disease) ([place])    Coronary artery disease    a. 2011 Cath: nonobs dzs; b. 09/2020 MV: EF 38%. No signif ischemia. ? inf MI vs diaph attenuation. GI uptake artifact.   Diabetes mellitus without complication ([place])    GERD (gastroesophageal reflux disease)    Hyperlipidemia    Hypertension    Morbid obesity ([place])    NICM (nonischemic cardiomyopathy) ([place])    a. 04/2019 Echo: EF 45-50%; b. 02/2019 Echo: EF 55-60%; c. 09/2020 Echo: EF 35-40%, glob HK; d. 09/2020 MV: No ischemia. ? inf infarct vs attenuation.   OSA (obstructive sleep apnea)     Past Surgical History:  Procedure Laterality Date   BACK SURGERY     CARDIAC CATHETERIZATION     CHOLECYSTECTOMY     COLONOSCOPY WITH PROPOFOL N/A 04/12/2019   Procedure: COLONOSCOPY WITH PROPOFOL;  Surgeon: [name], MD;  Location: [facility] ENDOSCOPY;  Service: Gastroenterology;  Laterality: N/A;   ESOPHAGOGASTRODUODENOSCOPY N/A  04/12/2019   Procedure: ESOPHAGOGASTRODUODENOSCOPY (EGD);  Surgeon: [name], MD;  Location: [facility] ENDOSCOPY;  Service: Gastroenterology;  Laterality: N/A;   ESOPHAGOGASTRODUODENOSCOPY (EGD) WITH PROPOFOL N/A 11/21/2019   Procedure: ESOPHAGOGASTRODUODENOSCOPY (EGD) WITH PROPOFOL;  Surgeon: [name], MD;  Location: [facility] ENDOSCOPY;  Service: Gastroenterology;  Laterality: N/A;   FLEXIBLE BRONCHOSCOPY Bilateral 05/17/2019   Procedure: FLEXIBLE BRONCHOSCOPY;  Surgeon: [name], MD;  Location: [name] ORS;  Service: Pulmonary;  Laterality: Bilateral;   left arm surgery     nephrectomy Left    PARATHYROIDECTOMY     RIGHT HEART CATH N/A 03/29/2019   Procedure: RIGHT HEART CATH;  Surgeon: [name], MD;  Location: [place] CV LAB;  Service: Cardiovascular;  Laterality: N/A;     Allergies  Allergies  Allergen Reactions   Penicillins Rash    History of Present Illness    65 year old male with the above complex past medical history including chronic combined systolic diastolic congestive heart failure, nonischemic cardiomyopathy, stage III chronic kidney disease, COPD, nonobstructive CAD, diabetes, GERD, hypertension, hyperlipidemia, obesity, and sleep apnea.  Patient was previously admitted in April of this year with chest pain and found to have an EF of 35 to 40% by echocardiogram.  Troponins peaked at 56.  Stress testing was undertaken and showed no ischemia.  A large perfusion defect in the inferior wall was noted but felt to be secondary to attenuation.  In the setting of chronic kidney disease, he was managed with hydralazine/nitrate therapy as well as beta-blocker, and  oral Lasix.  He has had several emergency room evaluations since then including visits for chest pain, COPD, flu, syncope, headache, and left flank pain.  On July 28, patient awoke at about 5 AM with severe, sharp midsternal chest pain as well as tachypalpitations, lightheadedness, and headache.   He presented to the emergency department where he Cytovene atrial flutter with rapid ventricular response.  Labs notable for normal troponins, stable H&H at 11.0/35.8, and stable renal function with a creatinine of 2.03.  CTA chest negative for PE.  After discussion with Dr. Rockey Situ, patient was provided a dose of carvedilol 12.5 mg (previous home dose 6.25 mg twice daily) and heart rates improved.  Recommendation was to initiate Eliquis and discharged home on a higher dose of beta-blocker.  Unfortunate, patient continued complain of sharp chest pain and lightheadedness and thus he was not discharged.  This morning, he continues to note mild midsternal sharp chest pain and lightheadedness, which is worse with position changes.  His chest pain worsened some with palpation.  He remains in atrial flutter with rates generally in the 70s.  Inpatient Medications     apixaban  5 mg Oral BID   carvedilol  12.5 mg Oral BID WC   insulin aspart  0-15 Units Subcutaneous TID WC   insulin aspart  0-5 Units Subcutaneous QHS   rosuvastatin  10 mg Oral Daily    Family History    Family History  Problem Relation Age of Onset   Diabetes Mother        2003   Lung cancer Father    Diabetes Sister    Hypertension Sister    Heart disease Sister    Cancer Sister    Bone cancer Brother    He indicated that his mother is deceased. He indicated that his father is deceased. He indicated that the status of his sister is unknown. He indicated that the status of his brother is unknown.   Social History    Social History   Socioeconomic History   Marital status: Single    Spouse name: Not on file   Number of children: Not on file   Years of education: Not on file   Highest education level: Not on file  Occupational History   Not on file  Tobacco Use   Smoking status: Former    Types: Cigarettes   Smokeless tobacco: Former    Types: Nurse, children's Use: Never used  Substance and Sexual Activity    Alcohol use: No   Drug use: No   Sexual activity: Not on file  Other Topics Concern   Not on file  Social History Narrative   Not on file   Social Determinants of Health   Financial Resource Strain: Not on file  Food Insecurity: Not on file  Transportation Needs: Not on file  Physical Activity: Not on file  Stress: Not on file  Social Connections: Not on file  Intimate Partner Violence: Not on file     Review of Systems    General:  No chills, fever, night sweats or weight changes.  Cardiovascular:  +++ sharp/constant midsternal chest pain since 7/28 @ 5am - some tenderness noted this AM, +++ chronic dyspnea on exertion, no edema, orthopnea, palpitations, paroxysmal nocturnal dyspnea. Dermatological: No rash, lesions/masses Respiratory: No cough, +++ chronic dyspnea Urologic: No hematuria, dysuria Abdominal:   No nausea, vomiting, diarrhea, bright red blood per rectum, melena, or hematemesis Neurologic:  +++lightheadedness - vision seems  blurred.  No wkns, changes in mental status. All other systems reviewed and are otherwise negative except as noted above.  Physical Exam    Blood pressure 135/72, pulse 70, temperature 98.2 F (36.8 C), temperature source Oral, resp. rate (!) 22, SpO2 96 %.  General: Pleasant, obese, NAD Psych: Normal affect. Neuro: Alert and oriented X 3. Moves all extremities spontaneously. HEENT: HOH Neck: Supple without bruits.  Difficult to gauge JVP 2/2 body habitus. Lungs:  Resp regular and unlabored, few scatt rhonchi. Heart: RRR, distant, no s3, s4, or murmurs. Abdomen: Obese, soft, non-tender, non-distended, BS + x 4.  Extremities: No clubbing, cyanosis or edema. PT2+, Radials 2+ and equal bilaterally.  Labs    Cardiac Enzymes Recent Labs  Lab 01/07/21 1033 01/07/21 1255  TROPONINIHS 12 14      Lab Results  Component Value Date   WBC 4.9 01/08/2021   HGB 11.0 (L) 01/08/2021   HCT 35.8 (L) 01/08/2021   MCV 86.7 01/08/2021   PLT  201 01/08/2021    Recent Labs  Lab 01/08/21 0702  NA 140  K 4.4  CL 109  CO2 23  BUN 39*  CREATININE 2.03*  CALCIUM 8.9  GLUCOSE 104*   Lab Results  Component Value Date   CHOL 131 10/07/2020   HDL 40 (L) 10/07/2020   LDLCALC 63 10/07/2020   TRIG 142 10/07/2020    Radiology Studies    DG Chest 2 View  Result Date: 01/07/2021 CLINICAL DATA:  Chest pain EXAM: CHEST - 2 VIEW COMPARISON:  11/24/2020 FINDINGS: Heart is upper limits normal in size. Lungs clear. No effusions or edema. No acute bony abnormality. IMPRESSION: No active cardiopulmonary disease. Electronically Signed   By: Rolm Baptise M.D.   On: 01/07/2021 10:59   CT Angio Chest PE W and/or Wo Contrast  Result Date: 01/07/2021 CLINICAL DATA:  PE suspected, high prob EXAM: CT ANGIOGRAPHY CHEST WITH CONTRAST TECHNIQUE: Multidetector CT imaging of the chest was performed using the standard protocol during bolus administration of intravenous contrast. Multiplanar CT image reconstructions and MIPs were obtained to evaluate the vascular anatomy. CONTRAST:  77m OMNIPAQUE IOHEXOL 350 MG/ML SOLN COMPARISON:  Oct 30, 2020 FINDINGS: Cardiovascular: Evaluation is limited secondary to suboptimal contrast opacification, artifact and respiratory motion. No central pulmonary embolism. No definitive acute pulmonary embolism through the proximal segmental pulmonary arteries. There are predominately LEFT-sided coronary artery atherosclerotic calcifications. No pericardial effusion. Heart is at the upper limits of normal in size. LEFT vertebral artery arises directly from the aorta. Scattered atherosclerotic calcifications throughout the aorta. Mediastinum/Nodes: No axillary or mediastinal adenopathy. Thyroid is unremarkable. Lungs/Pleura: Small LEFT pleural effusion. LEFT basilar ground-glass opacity. Evaluation of parenchymal detail is limited due to extensive respiratory motion. Upper Abdomen: Splenomegaly with multiple coarse calcifications  likely reflecting sequela of prior granulomatous infection. Renal cysts. Musculoskeletal: Degenerative changes of the thoracic spine. Gynecomastia. Review of the MIP images confirms the above findings. IMPRESSION: 1. No central pulmonary embolism within the limitations of this exam. If persistent concern consider dedicated lower extremity ultrasound or V/Q scan. 2. Small LEFT pleural effusion. 3. LEFT basilar ground-glass opacity, nonspecific with differential considerations including infection, aspiration or atelectasis. Aortic Atherosclerosis (ICD10-I70.0). Electronically Signed   By: SValentino SaxonMD   On: 01/07/2021 15:17    ECG & Cardiac Imaging    Atrial flutter, 122, PVC - personally reviewed.  Assessment & Plan    1.  Atrial Flutter w/ RVR:  No prior hx.  Awoke 7/28 w/ sharp  chest pain, dyspnea, lightheadedness, and tachypalpitations.  He presented to the ED and was found to be in aflutter @ 122.  HsTrops nl.  CTA w/o PE.  HR responded well to increase in home carvedilol dose to 12.69m last night.  No additional ? blocker ordered following admission.  Currently, no OAC ordered either.  Rates in 70's overnight/this AM.  Pt denies palps but cont to c/o mild, sharp midsternal chest pain and tenderness, as well as feeling lightheaded - worse w/ position changes.  I will order carvedilol 12.570mbid and eliquis 32m54mID (CHA2DS2VASc = 5).  Not clear that aflutter is contributing to current Ss of chest pain/lightheadedness given excellent rate control.  Would plan outpt DCCV in ~ 4 wks, provided he is compliant w/ OAC.  2.  Chest pain/Nonobstructive CAD:  prior cath in 2011 w/ nonobs dzs.  MV in April w/o significant ischemia.  He developed c/p @ 5a on 7/28, which ultimately prompted presentation.  HsTrops 12  14 w/in 5 and 7 hrs of Ss onset.  He cont to c/o chest pain and this is somewhat worsened w/ palpation.  I will f/u a HsTrop this AM and if this remains nl, would not plan to pursue any  additional ischemic eval.  With starting eliquis, reasonable to d/c home dose of ASA.  3.  Lightheadedness:  Has noted this since yesterday morning.  Worse w/ position changes.  BP's initially high, now stable.  Check orthostatics.  Consider head CT to r/o CVA in setting of aflutter.  With good rate control and hemodynamic stability, it's unlikely that aflutter is causing LH otw.  4.  Essential HTN:  stable on higher dose of ? blocker.  He also takes hydralazine and imdur @ home.  These are not currently ordered - check orthostatics and resume if appropriate.  5.  HL:  LDL 63 in April.  Resume home dose of rosuvastatin.  6.  CKD III:  Creat stable @ 2.03.  7.  Chronic combined syst/diast CHF/NICM:  EF 35-40% in April w/ glob HK.  MV w/o ischemia @ that time.  Body habitus makes exam very challenging.  He has chronic DOE, but does not have any lower ext edema.  His abd is soft.  No crackles on exam.  Cont ? blocker and resume hydral/nitrate (if no orthostasis noted).   No ACEI/ARB/ARNI/Aldosterone antagonist in setting of CKD III.   8.  DMII:  insulin mgmt per IM.  9.  OSA:  needs CPAP.  Signed, ChrMurray HodgkinsP 01/08/2021, 12:57 PM  For questions or updates, please contact   Please consult [email or web address] for contact info under Cardiology/STEMI.

## 2021-01-08 NOTE — ED Notes (Signed)
Rounding on pt. Asked about pain he states "hurts a little". Offered pain medication and pt refused.

## 2021-01-08 NOTE — ED Notes (Signed)
Dr. Duncan at bedside 

## 2021-01-09 DIAGNOSIS — I4892 Unspecified atrial flutter: Secondary | ICD-10-CM | POA: Diagnosis not present

## 2021-01-09 DIAGNOSIS — R079 Chest pain, unspecified: Secondary | ICD-10-CM | POA: Diagnosis not present

## 2021-01-09 DIAGNOSIS — J449 Chronic obstructive pulmonary disease, unspecified: Secondary | ICD-10-CM | POA: Diagnosis not present

## 2021-01-09 DIAGNOSIS — I10 Essential (primary) hypertension: Secondary | ICD-10-CM | POA: Diagnosis not present

## 2021-01-09 DIAGNOSIS — I483 Typical atrial flutter: Secondary | ICD-10-CM | POA: Diagnosis not present

## 2021-01-09 DIAGNOSIS — I5022 Chronic systolic (congestive) heart failure: Secondary | ICD-10-CM | POA: Diagnosis not present

## 2021-01-09 DIAGNOSIS — G4733 Obstructive sleep apnea (adult) (pediatric): Secondary | ICD-10-CM | POA: Diagnosis not present

## 2021-01-09 LAB — HEMOGLOBIN A1C
Hgb A1c MFr Bld: 6.2 % — ABNORMAL HIGH (ref 4.8–5.6)
Mean Plasma Glucose: 131 mg/dL

## 2021-01-09 LAB — VITAMIN B12: Vitamin B-12: 333 pg/mL (ref 180–914)

## 2021-01-09 LAB — GLUCOSE, CAPILLARY: Glucose-Capillary: 133 mg/dL — ABNORMAL HIGH (ref 70–99)

## 2021-01-09 MED ORDER — VITAMIN B-12 1000 MCG PO TABS: 1000.0000 ug | ORAL_TABLET | Freq: Every day | ORAL | Status: DC

## 2021-01-09 MED ORDER — CYANOCOBALAMIN 1000 MCG PO TABS: 1000.0000 ug | ORAL_TABLET | Freq: Every day | ORAL | 3 refills | Status: DC

## 2021-01-09 MED ORDER — CYANOCOBALAMIN 1000 MCG/ML IJ SOLN
1000.0000 ug | Freq: Every day | INTRAMUSCULAR | Status: DC
  Administered 2021-01-09: 1000 ug via INTRAMUSCULAR
  Filled 2021-01-09: qty 1

## 2021-01-09 NOTE — Progress Notes (Signed)
Physical Therapy Treatment Patient Details Name: Larry Callahan MRN: [id number] DOB: [birth year] Today's Date: 01/09/2021    History of Present Illness Larry Callahan is a 80yoM who comes to [facility] on 01/07/21 c CP. PMH: CAD, CKD3b, HTN, COPD, ODA, sCHF c EF35-40%, syncope, no clear etiology determined Pt noted to have new onset AF c RVR.    PT Comments    Pt did well with mobility, circumambulated the nurses' station without issue, negotiated up/down steps and was negative with posterior canal BPPV sceen.  Pt displays ability to safely return home, reports he feels confident in doing so.     Follow Up Recommendations  Home health PT     Equipment Recommendations  None recommended by PT    Recommendations for Other Services       Precautions / Restrictions Precautions Precautions: Fall Restrictions Weight Bearing Restrictions: No    Mobility  Bed Mobility Overal bed mobility: Independent                  Transfers Overall transfer level: Independent Equipment used: None             General transfer comment: Pt able to rise confidently and w/o direct assist  Ambulation/Gait Ambulation/Gait assistance: Min guard   Assistive device: Rolling walker (2 wheeled)       General Gait Details: Pt able to easily and confidently circumambulate the nurses' station with   Stairs Stairs: Yes Stairs assistance: Modified independent (Device/Increase time) Stair Management: One rail Left Number of Stairs: 6 General stair comments: easily negotiates up/down steps   Wheelchair Mobility    Modified Rankin (Stroke Patients Only)       Balance Overall balance assessment: Modified Independent                                          Cognition Arousal/Alertness: Awake/alert Behavior During Therapy: WFL for tasks assessed/performed Overall Cognitive Status: Within Functional Limits for tasks assessed                                         Exercises      General Comments General comments (skin integrity, edema, etc.): performed [place] (-) bilaterally, pt reports he has not had dizziness today      Pertinent Vitals/Pain Pain Assessment: No/denies pain    Home Living                      Prior Function            PT Goals (current goals can now be found in the care plan section) Progress towards PT goals: Progressing toward goals    Frequency    Min 2X/week      PT Plan Discharge plan needs to be updated    Co-evaluation              AM-PAC PT "6 Clicks" Mobility   Outcome Measure  Help needed turning from your back to your side while in a flat bed without using bedrails?: None Help needed moving from lying on your back to sitting on the side of a flat bed without using bedrails?: None Help needed moving to and from a bed to a chair (including a wheelchair)?: None Help needed standing up from a  chair using your arms (e.g., wheelchair or bedside chair)?: None Help needed to walk in hospital room?: None Help needed climbing 3-5 steps with a railing? : None 6 Click Score: 24    End of Session Equipment Utilized During Treatment: Gait belt Activity Tolerance: Patient tolerated treatment well Patient left: in chair;with nursing/sitter in room;with call bell/phone within reach Nurse Communication: Mobility status PT Visit Diagnosis: Unsteadiness on feet (R26.81);Difficulty in walking, not elsewhere classified (R26.2);Muscle weakness (generalized) (M62.81)     Time: 6010-9323 PT Time Calculation (min) (ACUTE ONLY): 17 min  Charges:  $Gait Training: 8-22 mins                     Kreg Shropshire, DPT 01/09/2021, 10:23 AM

## 2021-01-09 NOTE — Discharge Summary (Signed)
[facility] NAME: Larry Callahan    MR#:  [id number]  DATE OF BIRTH:  December 23, 1955  DATE OF ADMISSION:  01/07/2021 ADMITTING PHYSICIAN: Athena Masse, MD  DATE OF DISCHARGE: 01/09/2021  PRIMARY CARE PHYSICIAN: Lavera Guise, MD    ADMISSION DIAGNOSIS:  Lightheadedness [R42] Chest tightness [R07.89] Chest pain [R07.9] New onset atrial flutter ([place]) [I48.92] Atrial flutter, unspecified type ([place]) [I48.92]  DISCHARGE DIAGNOSIS:  Atrial Flutter--new  SECONDARY DIAGNOSIS:   Past Medical History:  Diagnosis Date   Allergy    Chronic combined systolic (congestive) and diastolic (congestive) heart failure ([place])    a. 04/2019 Echo: EF 45-50%; b. 02/2019 Echo: EF 55-60%; c. 09/2020 Echo: EF 35-40%, glob HK. Nl RV size/fxn. Mild MR.   CKD (chronic kidney disease), stage III (HCC)    COPD (chronic obstructive pulmonary disease) ([place])    Coronary artery disease    a. 2011 Cath: nonobs dzs; b. 09/2020 MV: EF 38%. No signif ischemia. ? inf MI vs diaph attenuation. GI uptake artifact.   Diabetes mellitus without complication ([place])    GERD (gastroesophageal reflux disease)    Hyperlipidemia    Hypertension    Morbid obesity ([place])    NICM (nonischemic cardiomyopathy) ([place])    a. 04/2019 Echo: EF 45-50%; b. 02/2019 Echo: EF 55-60%; c. 09/2020 Echo: EF 35-40%, glob HK; d. 09/2020 MV: No ischemia. ? inf infarct vs attenuation.   OSA (obstructive sleep apnea)     HOSPITAL COURSE:  Larry Callahan is a 65 y.o. male with medical history significant for morbid obesity, CAD, CKD 3B, HTN, COPD, OSA, chronic systolic heart failure, last EF 35 to 40% in April 2022 with a moderate risk Myoview 09/2020, chest pain admission in May 2022 with unremarkable work-up and syncope admission June 2022 with negative work-up who now presents to the ED with nonradiating substernal chest pain that started on the morning of arrival associated with  lightheadednes 65 year old male with history of morbid obesity, CAD CKD 3B, HTN, COPD, OSA, chronic systolic heart failure, presenting with chest pain and shortness of breath.  Found to be in new onset A. fib with RVR, controlled while in the ED, but with persistent lightheadedness and headache.     Chest pain   New onset atrial flutter ([place]) -Patient with chest pain but with negative troponins of 12-14 and with EKG nonacute - Hospitalized with chest pain 2 months prior with negative cardiac work-up, - Moderate risk Myoview 09/2020 - Rapid A. fib in the ED to 122 controlled with Coreg - Rate controlled on Coreg, increased to 12.5 mg twice daily per Dr. Rockey Situ - Started on Eliquis - Cardiology consult noted--no new recs other than f/u in office for possible DCCV  Lightheadedness/headache - Lightheadedness persistent in spite of control of heart rate - Negative syncope work-up in June 2022 - Neuro exam nonfocal - MRI brian in June neg--possible vertigo. Pt on chronic meclizine for it. Neurology input noted. Low suspicion for CVA. Will try vestibular PT     Chronic systolic CHF  - Echocardiogram 4/22 with EF 35 to 40% - Continue carvedilol, furosemide, Imdur.  Not currently on ACE/ARB likely related to renal function  Obstructive sleep apnea, adult - CPAP    COPD - Not acutely exacerbated - Albuterol as needed, home inhalers     Essential hypertension -Continue hydralazine  Diabetes - Sliding scale insulin coverage     Stage 3b chronic kidney disease ([place]) -  Renal function at baseline    pt wants to go home. He is not interested in rehab  DVT prophylaxis: eliquis  Code Status: full code  Family Communication:  none  Disposition Plan: Back to previous home environment Consults called: cardiology , neuro Level of care: Progressive Cardiac Status is: Observation   The patient remains OBS appropriate and will d/c before 2 midnights.   Dispo: The patient is from: Home               Anticipated d/c is to: Home today              Patient currently is medically stable to d/c.              Difficult to place patient No        CONSULTS OBTAINED:  Treatment Team:  Nelva Bush, MD  DRUG ALLERGIES:   Allergies  Allergen Reactions   Penicillins Rash    DISCHARGE MEDICATIONS:   Allergies as of 01/09/2021       Reactions   Penicillins Rash        Medication List     STOP taking these medications    doxycycline 100 MG tablet Commonly known as: VIBRA-TABS       TAKE these medications    albuterol 108 (90 Base) MCG/ACT inhaler Commonly known as: VENTOLIN HFA Inhale 2 puffs into the lungs every 6 (six) hours as needed for wheezing or shortness of breath.   apixaban 2.5 MG Tabs tablet Commonly known as: ELIQUIS Take 1 tablet (2.5 mg total) by mouth 2 (two) times daily.   budesonide-formoterol 160-4.5 MCG/ACT inhaler Commonly known as: SYMBICORT Inhale 2 puffs into the lungs 2 (two) times daily.   carvedilol 12.5 MG tablet Commonly known as: Coreg Take 1 tablet (12.5 mg total) by mouth 2 (two) times daily. What changed:  medication strength how much to take when to take this   cetirizine 10 MG tablet Commonly known as: ZYRTEC Take 10 mg by mouth daily.   cyanocobalamin 1000 MCG tablet Take 1 tablet (1,000 mcg total) by mouth daily. Start taking on: January 16, 2021   escitalopram 10 MG tablet Commonly known as: Lexapro Take one tab po qd for anxiety with food What changed:  how much to take how to take this when to take this   ferrous gluconate 324 MG tablet Commonly known as: FERGON Take 1 tablet (324 mg total) by mouth daily with breakfast.   furosemide 40 MG tablet Commonly known as: LASIX Take one to tabs once or twice days for fluid // swelling   gabapentin 100 MG capsule Commonly known as: NEURONTIN Take 1 capsule (100 mg total) by mouth in the morning, at noon, and at bedtime.   glimepiride 2 MG  tablet Commonly known as: AMARYL Take one tab po qd with supper   hydrALAZINE 100 MG tablet Commonly known as: APRESOLINE Take 1 tablet (100 mg total) by mouth 3 (three) times daily.   insulin glargine (1 Unit Dial) 300 UNIT/ML Solostar Pen Commonly known as: TOUJEO Inject 26 Units into the skin daily.   ipratropium-albuterol 0.5-2.5 (3) MG/3ML Soln Commonly known as: DUONEB Take 3 mLs by nebulization every 6 (six) hours as needed.   isosorbide mononitrate 30 MG 24 hr tablet Commonly known as: IMDUR Take 1 tablet (30 mg total) by mouth 2 (two) times daily.   meclizine 25 MG tablet Commonly known as: ANTIVERT Take 1 tablet (25 mg total) by mouth 3 (three)  times daily as needed for dizziness or nausea.   Novofine Pen Needle 32G X 6 MM Misc Generic drug: Insulin Pen Needle Use as directed   omeprazole 40 MG capsule Commonly known as: PRILOSEC Take 1 capsule (40 mg total) by mouth in the morning and at bedtime.   potassium chloride SA 20 MEQ tablet Commonly known as: KLOR-CON Take 1 tablet (20 mEq total) by mouth 2 (two) times daily.   rosuvastatin 10 MG tablet Commonly known as: CRESTOR Take 10 mg by mouth at bedtime.   Spiriva HandiHaler 18 MCG inhalation capsule Generic drug: tiotropium Place 1 capsule (18 mcg total) into inhaler and inhale daily.   True Metrix Air Glucose Meter Devi 1 Device by Does not apply route 2 (two) times daily.   True Metrix Blood Glucose Test test strip Generic drug: glucose blood Use as instructed twice daily diag E11.65   True Metrix Level 1 Low Soln Use as directed   TRUEplus Lancets 33G Misc Use as directed twice daily diag E11.65        If you experience worsening of your admission symptoms, develop shortness of breath, life threatening emergency, suicidal or homicidal thoughts you must seek medical attention immediately by calling 911 or calling your MD immediately  if symptoms less severe.  You Must read complete  instructions/literature along with all the possible adverse reactions/side effects for all the Medicines you take and that have been prescribed to you. Take any new Medicines after you have completely understood and accept all the possible adverse reactions/side effects.   Please note  You were cared for by a hospitalist during your hospital stay. If you have any questions about your discharge medications or the care you received while you were in the hospital after you are discharged, you can call the unit and asked to speak with the hospitalist on call if the hospitalist that took care of you is not available. Once you are discharged, your primary care physician will handle any further medical issues. Please note that NO REFILLS for any discharge medications will be authorized once you are discharged, as it is imperative that you return to your primary care physician (or establish a relationship with a primary care physician if you do not have one) for your aftercare needs so that they can reassess your need for medications and monitor your lab values. Today   SUBJECTIVE  No new complaints   VITAL SIGNS:  Blood pressure (!) 137/91, pulse 68, temperature 97.8 F (36.6 C), temperature source Oral, resp. rate 17, height 5\' 11"  (1.803 m), weight 119.7 kg, SpO2 95 %.  I/O:   Intake/Output Summary (Last 24 hours) at 01/09/2021 0908 Last data filed at 01/09/2021 0800 Gross per 24 hour  Intake 580 ml  Output 750 ml  Net -170 ml    PHYSICAL EXAMINATION:  GENERAL:  65 y.o.-year-old patient lying in the bed with no acute distress. Obese LUNGS: Normal breath sounds bilaterally, no wheezing, rales,rhonchi or crepitation. No use of accessory muscles of respiration.  CARDIOVASCULAR: S1, S2 normal. No murmurs, rubs, or gallops.  ABDOMEN: Soft, non-tender, non-distended. Bowel sounds present. No organomegaly or mass.  EXTREMITIES: No pedal edema, cyanosis, or clubbing.  NEUROLOGIC: Cranial nerves II  through XII are intact. Muscle strength 5/5 in all extremities. Sensation intact. Gait not checked.  PSYCHIATRIC: The patient is alert and oriented x 3.  SKIN: No obvious rash, lesion, or ulcer.   DATA REVIEW:   CBC  Recent Labs  Lab 01/08/21  0702  WBC 4.9  HGB 11.0*  HCT 35.8*  PLT 201    Chemistries  Recent Labs  Lab 01/08/21 0702  NA 140  K 4.4  CL 109  CO2 23  GLUCOSE 104*  BUN 39*  CREATININE 2.03*  CALCIUM 8.9    Microbiology Results   Recent Results (from the past 240 hour(s))  Resp Panel by RT-PCR (Flu A&B, Covid) Nasopharyngeal Swab     Status: None   Collection Time: 01/07/21  4:11 PM   Specimen: Nasopharyngeal Swab; Nasopharyngeal(NP) swabs in vial transport medium  Result Value Ref Range Status   SARS Coronavirus 2 by RT PCR NEGATIVE NEGATIVE Final    Comment: (NOTE) SARS-CoV-2 target nucleic acids are NOT DETECTED.  The SARS-CoV-2 RNA is generally detectable in upper respiratory specimens during the acute phase of infection. The lowest concentration of SARS-CoV-2 viral copies this assay can detect is 138 copies/mL. A negative result does not preclude SARS-Cov-2 infection and should not be used as the sole basis for treatment or other patient management decisions. A negative result may occur with  improper specimen collection/handling, submission of specimen other than nasopharyngeal swab, presence of viral mutation(s) within the areas targeted by this assay, and inadequate number of viral copies(<138 copies/mL). A negative result must be combined with clinical observations, patient history, and epidemiological information. The expected result is Negative.  Fact Sheet for Patients:  EntrepreneurPulse.com.au  Fact Sheet for Healthcare Providers:  IncredibleEmployment.be  This test is no t yet approved or cleared by the [place] FDA and  has been authorized for detection and/or diagnosis of SARS-CoV-2  by FDA under an Emergency Use Authorization (EUA). This EUA will remain  in effect (meaning this test can be used) for the duration of the COVID-19 declaration under Section 564(b)(1) of the Act, 21 U.S.C.section 360bbb-3(b)(1), unless the authorization is terminated  or revoked sooner.       Influenza A by PCR NEGATIVE NEGATIVE Final   Influenza B by PCR NEGATIVE NEGATIVE Final    Comment: (NOTE) The Xpert Xpress SARS-CoV-2/FLU/RSV plus assay is intended as an aid in the diagnosis of influenza from Nasopharyngeal swab specimens and should not be used as a sole basis for treatment. Nasal washings and aspirates are unacceptable for Xpert Xpress SARS-CoV-2/FLU/RSV testing.  Fact Sheet for Patients: EntrepreneurPulse.com.au  Fact Sheet for Healthcare Providers: IncredibleEmployment.be  This test is not yet approved or cleared by the [place] FDA and has been authorized for detection and/or diagnosis of SARS-CoV-2 by FDA under an Emergency Use Authorization (EUA). This EUA will remain in effect (meaning this test can be used) for the duration of the COVID-19 declaration under Section 564(b)(1) of the Act, 21 U.S.C. section 360bbb-3(b)(1), unless the authorization is terminated or revoked.  Performed at [facility], [address] [place], [place] [address]     RADIOLOGY:  DG Chest 2 View  Result Date: 01/07/2021 CLINICAL DATA:  Chest pain EXAM: CHEST - 2 VIEW COMPARISON:  11/24/2020 FINDINGS: Heart is upper limits normal in size. Lungs clear. No effusions or edema. No acute bony abnormality. IMPRESSION: No active cardiopulmonary disease. Electronically Signed   By: Rolm Baptise M.D.   On: 01/07/2021 10:59   CT Angio Chest PE W and/or Wo Contrast  Result Date: 01/07/2021 CLINICAL DATA:  PE suspected, high prob EXAM: CT ANGIOGRAPHY CHEST WITH CONTRAST TECHNIQUE: Multidetector CT imaging of the chest was performed using the  standard protocol during bolus administration of intravenous contrast. Multiplanar CT image  reconstructions and MIPs were obtained to evaluate the vascular anatomy. CONTRAST:  17mL OMNIPAQUE IOHEXOL 350 MG/ML SOLN COMPARISON:  Oct 30, 2020 FINDINGS: Cardiovascular: Evaluation is limited secondary to suboptimal contrast opacification, artifact and respiratory motion. No central pulmonary embolism. No definitive acute pulmonary embolism through the proximal segmental pulmonary arteries. There are predominately LEFT-sided coronary artery atherosclerotic calcifications. No pericardial effusion. Heart is at the upper limits of normal in size. LEFT vertebral artery arises directly from the aorta. Scattered atherosclerotic calcifications throughout the aorta. Mediastinum/Nodes: No axillary or mediastinal adenopathy. Thyroid is unremarkable. Lungs/Pleura: Small LEFT pleural effusion. LEFT basilar ground-glass opacity. Evaluation of parenchymal detail is limited due to extensive respiratory motion. Upper Abdomen: Splenomegaly with multiple coarse calcifications likely reflecting sequela of prior granulomatous infection. Renal cysts. Musculoskeletal: Degenerative changes of the thoracic spine. Gynecomastia. Review of the MIP images confirms the above findings. IMPRESSION: 1. No central pulmonary embolism within the limitations of this exam. If persistent concern consider dedicated lower extremity ultrasound or V/Q scan. 2. Small LEFT pleural effusion. 3. LEFT basilar ground-glass opacity, nonspecific with differential considerations including infection, aspiration or atelectasis. Aortic Atherosclerosis (ICD10-I70.0). Electronically Signed   By: Valentino Saxon MD   On: 01/07/2021 15:17     CODE STATUS:     Code Status Orders  (From admission, onward)           Start     Ordered   01/07/21 2103  Full code  Continuous        01/07/21 2104           Code Status History     Date Active Date Inactive  Code Status Order ID Comments User Context   11/16/2020 2127 11/18/2020 1853 Full Code [id number]  Marcelyn Bruins, MD ED   10/30/2020 1721 10/31/2020 1851 Full Code [id number]  Collier Bullock, MD ED   10/06/2020 1602 10/08/2020 2106 Full Code [id number]  Ivor Costa, MD ED   09/25/2020 1644 09/26/2020 1959 Full Code [id number]  Collier Bullock, MD ED   01/29/2020 2344 02/03/2020 2010 Full Code [id number]  Athena Masse, MD ED   01/09/2020 0302 01/10/2020 2006 Full Code [id number]  Vianne Bulls, MD ED   11/19/2019 1903 11/22/2019 0754 Full Code [id number]  Enzo Bi, MD ED   08/08/2019 2044 08/09/2019 1910 Full Code [id number]  Orene Desanctis, DO ED   06/17/2019 1605 06/18/2019 2231 Full Code [id number]  Ezekiel Slocumb, DO ED   05/19/2019 2136 05/24/2019 1456 Full Code [id number]  Mansy, Arvella Merles, MD ED   05/16/2019 0118 05/18/2019 1503 Full Code [id number]  Orene Desanctis, DO ED   05/09/2019 1352 05/13/2019 2257 Full Code [id number]  Damita Lack, MD ED   04/24/2019 2038 04/30/2019 1406 Full Code [id number]  Caren Griffins, MD Inpatient   04/10/2019 1958 04/13/2019 1718 Full Code [id number]  Loletha Grayer, MD ED   03/26/2019 1532 03/31/2019 1723 Full Code [id number]  Loletha Grayer, MD ED   03/01/2019 1536 03/05/2019 2055 Full Code [id number]  Loletha Grayer, MD ED        TOTAL TIME TAKING CARE OF THIS PATIENT: 35 minutes.    Fritzi Mandes M.D  [facility]    CC: Primary care physician; Lavera Guise, MD

## 2021-01-15 ENCOUNTER — Telehealth: Payer: Self-pay

## 2021-01-15 NOTE — Telephone Encounter (Signed)
Left vm to schedule hospital follow up visit-[name] 

## 2021-01-18 ENCOUNTER — Telehealth: Payer: Self-pay

## 2021-01-18 NOTE — Telephone Encounter (Signed)
Left vm to schedule hospital follow up-[name]

## 2021-01-20 DIAGNOSIS — I5043 Acute on chronic combined systolic (congestive) and diastolic (congestive) heart failure: Secondary | ICD-10-CM | POA: Diagnosis not present

## 2021-01-26 ENCOUNTER — Other Ambulatory Visit: Payer: Self-pay | Admitting: Internal Medicine

## 2021-01-26 DIAGNOSIS — K219 Gastro-esophageal reflux disease without esophagitis: Secondary | ICD-10-CM

## 2021-01-27 IMAGING — CT CT HIP*R* W/O CM
3 series · 15 of 33 positions shown, 18 images · non-contrast
Comparison: X-ray 06/17/2019, CT 04/24/2019

CLINICAL DATA: Right hip pain after fall

EXAM:
CT OF THE RIGHT HIP WITHOUT CONTRAST
TECHNIQUE: Multidetector CT imaging of the right hip was performed according to
the standard protocol. Multiplanar CT image reconstructions were
also generated.

[Series 8: coronal st · coronal · 0.42mm/px · 3 of 118 slices shown]
[im 24/118  bone]
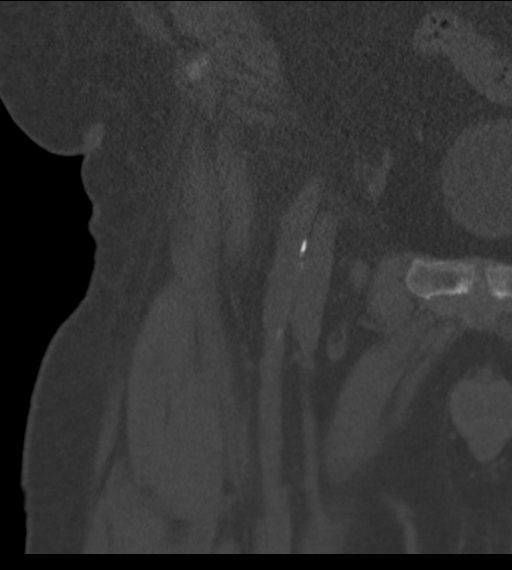
[im 47/118  bone]
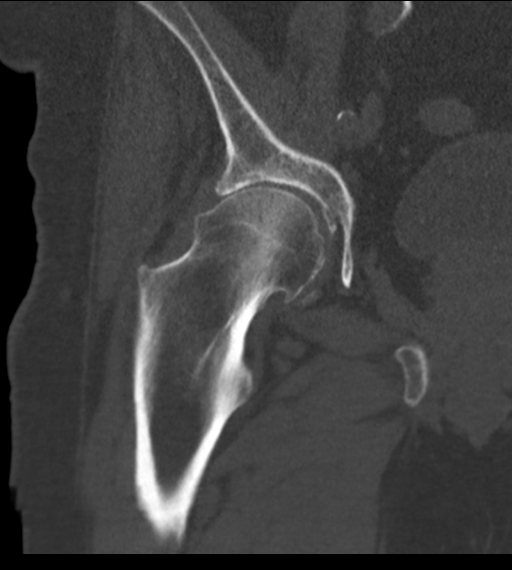
[im 71/118  bone]
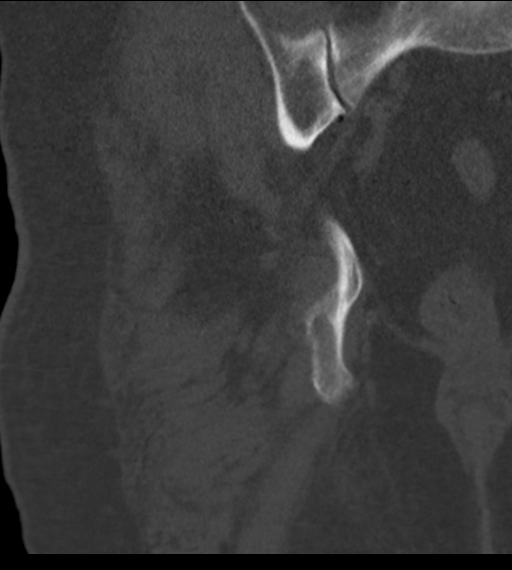

[Series 9: sagittal st · sagittal · 0.46mm/px · 5 of 72 slices shown, 6 images]
[im 24/72  bone]
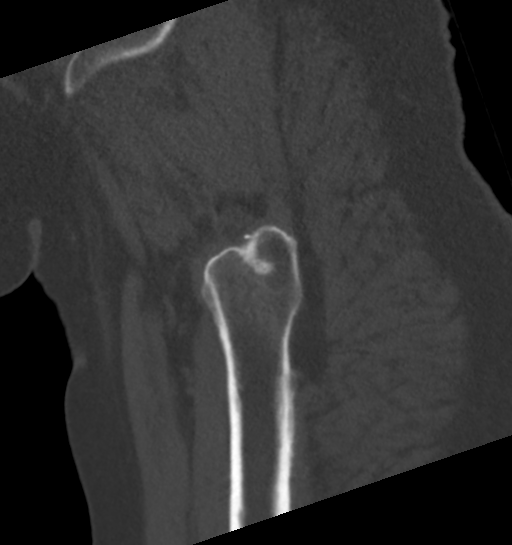
[im 30/72  bone]
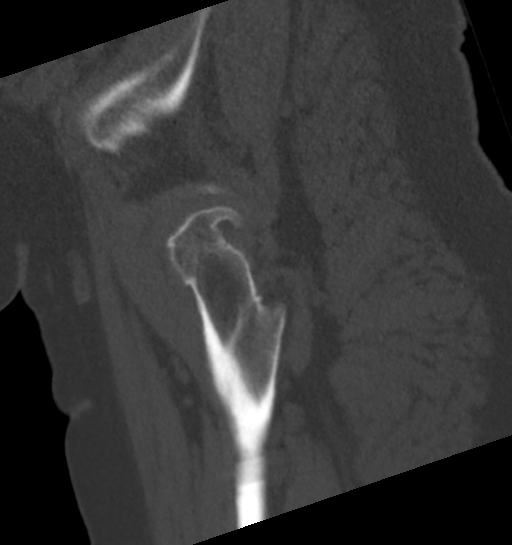
[im 36/72  soft-tissue]
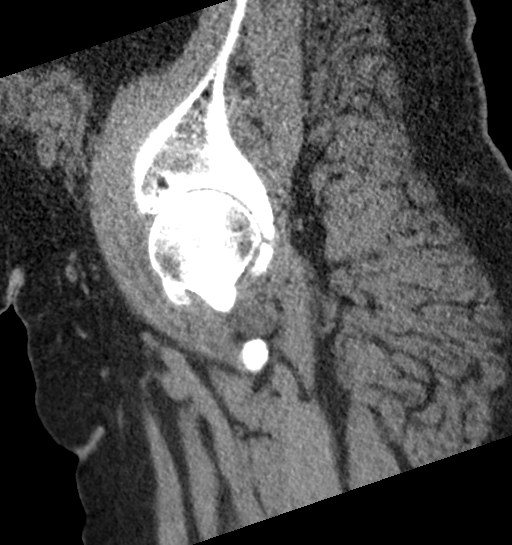
[im 36/72  bone]
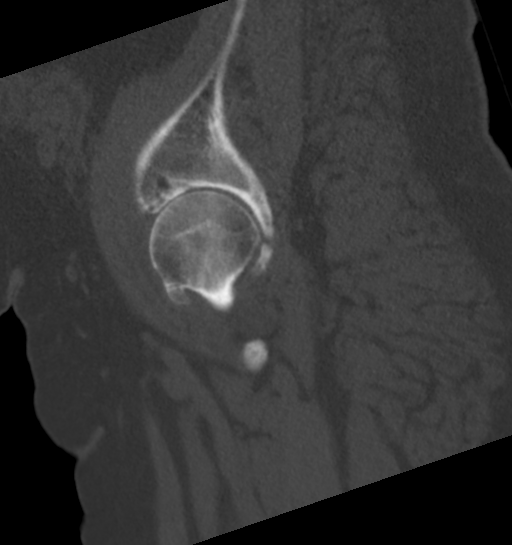
[im 42/72  bone]
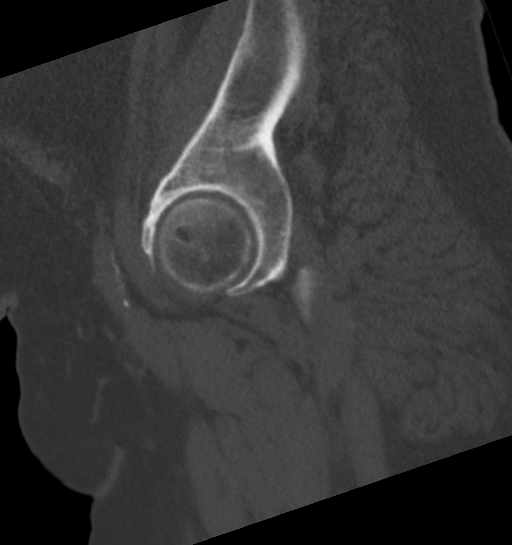
[im 48/72  bone]
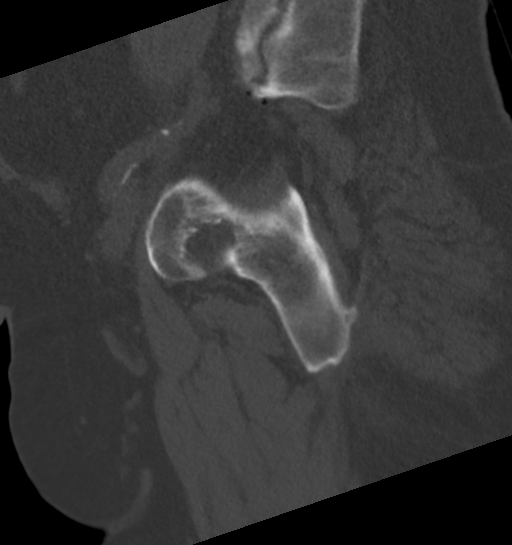

[Series 13: axial soft tissue · axial · 0.55mm/px · z∈[-919,-725]mm · 7 of 115 slices shown, 9 images]
[im 9/115  soft-tissue]
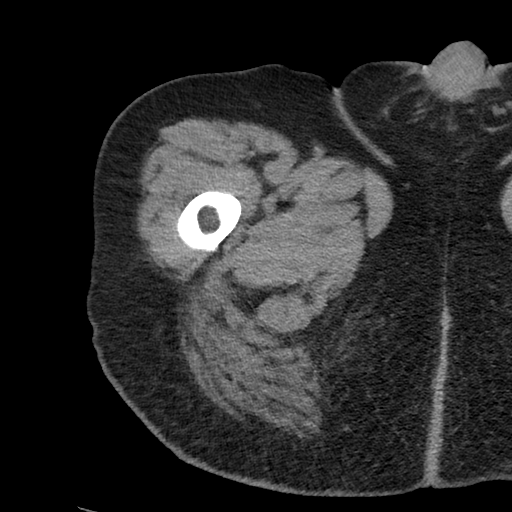
[im 9/115  bone]
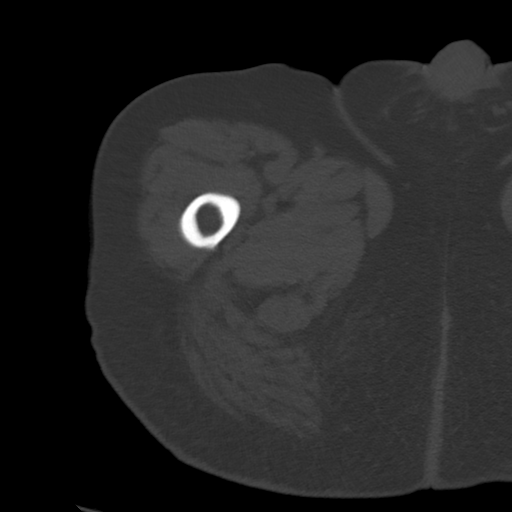
[im 27/115  bone]
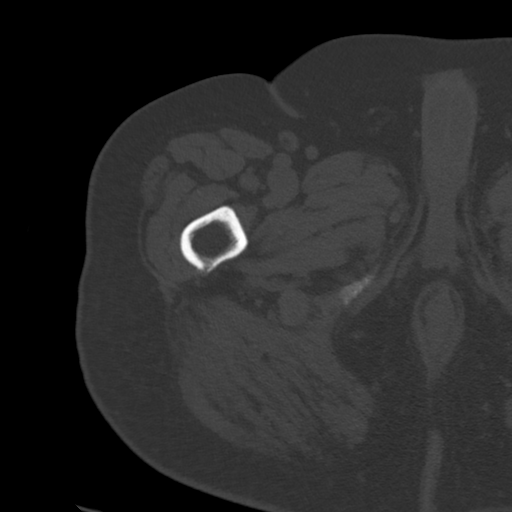
[im 44/115  bone]
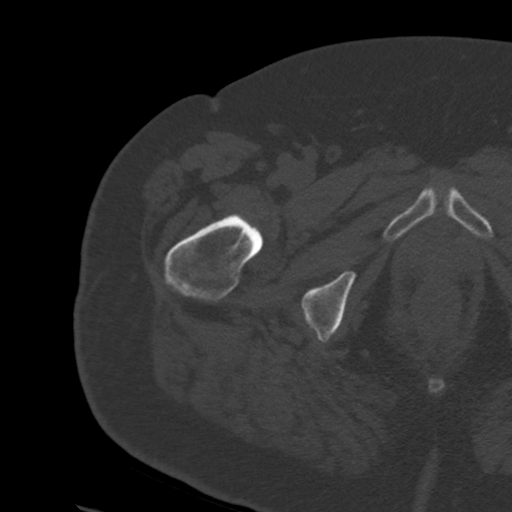
[im 62/115  bone]
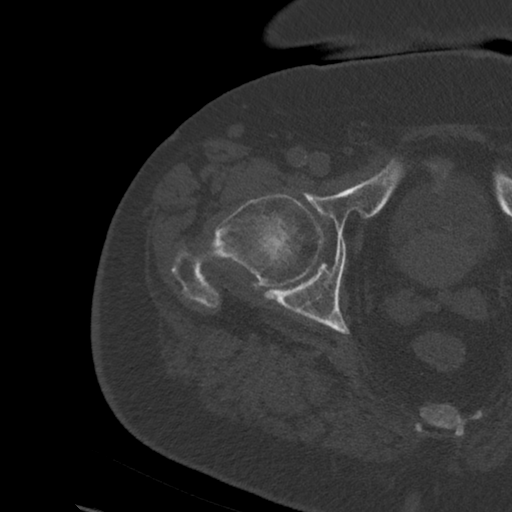
[im 71/115  soft-tissue]
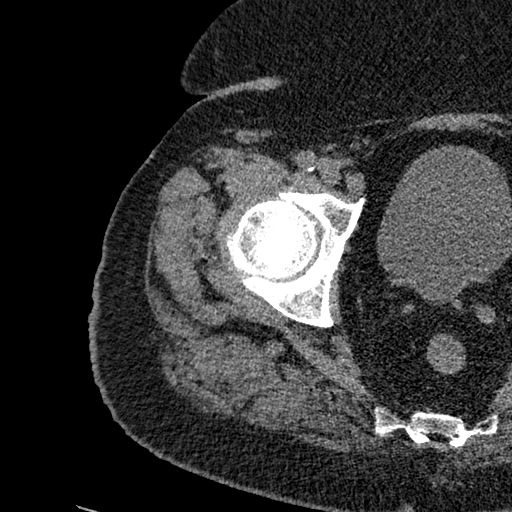
[im 71/115  bone]
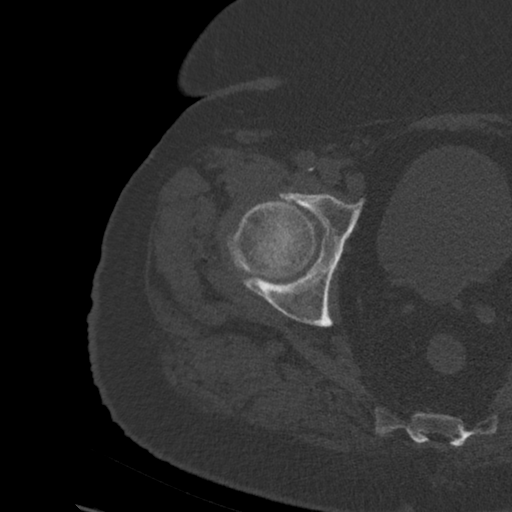
[im 88/115  bone]
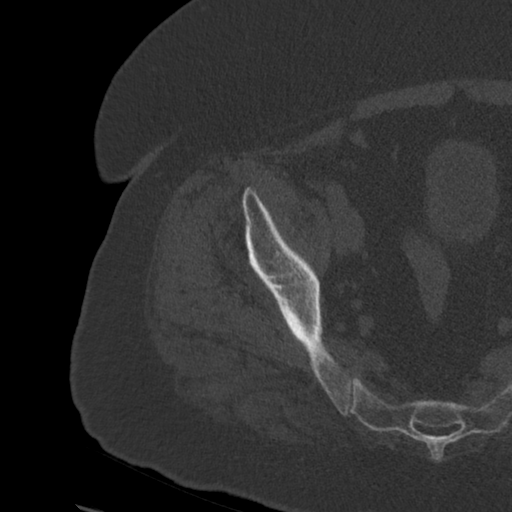
[im 106/115  bone]
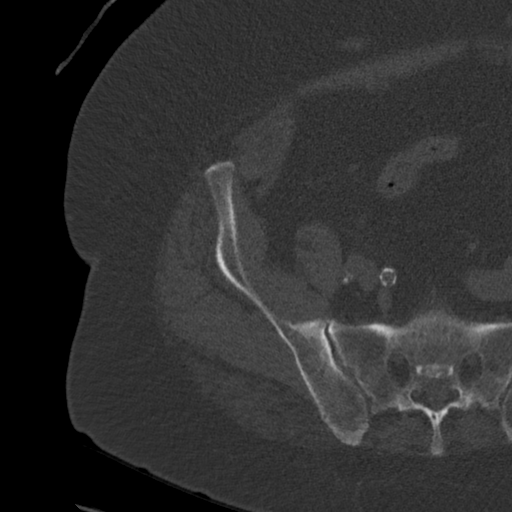

[15 of 33 positions shown; findings below may reference images not displayed]

FINDINGS: Bones/Joint/Cartilage

No acute fracture. Right hip joint is intact without dislocation.
Severe osteoarthritis of the right hip joint with near complete
joint space loss, subchondral sclerosis/cystic change, and large
marginal osteophyte formation. Unchanged appearance of slightly
fragmented osteophyte at the posterior acetabulum (series 12, image
39; series 6, image 39), compared to prior CT. Right SI joint and
pubic symphysis are intact without diastasis. No lytic or sclerotic
osseous lesion. No appreciable hip joint effusion.

Ligaments

Suboptimally assessed by CT.

Muscles and Tendons

Preservation of the muscle bulk. No obvious muscular or tendinous
abnormality within the limitations of CT.

Soft tissues

No soft tissue fluid collection or hematoma. There scattered
atherosclerotic calcifications. Scattered colonic diverticulosis. No
acute findings within the visualized portion of the pelvis.
IMPRESSION: 1. No acute fracture or dislocation of the right hip joint.
2. Severe osteoarthritis of the right hip joint.

## 2021-01-27 IMAGING — CT CT L SPINE W/O CM
3 series · 10 of 33 positions shown, 11 images · non-contrast
Comparison: Lumbar spine radiographs 05/19/2019

CLINICAL DATA: Fall.  Back pain

EXAM:
CT LUMBAR SPINE WITHOUT CONTRAST
TECHNIQUE: Multidetector CT imaging of the lumbar spine was performed without
intravenous contrast administration. Multiplanar CT image
reconstructions were also generated.

[Series 6: l spine soft · axial · 0.29mm/px · z∈[-718,-566]mm · 2 of 155 slices shown, 3 images]
[im 48/155  soft-tissue]
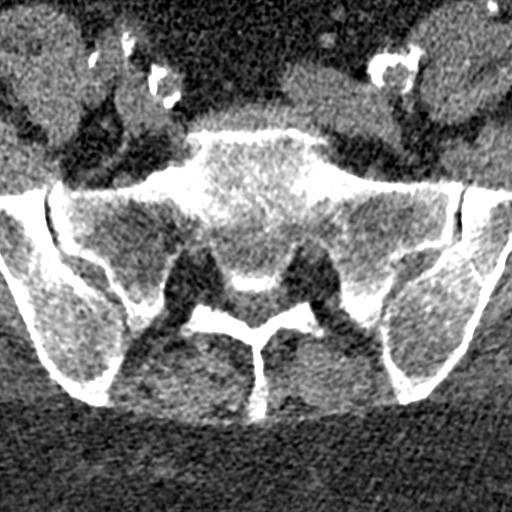
[im 48/155  bone]
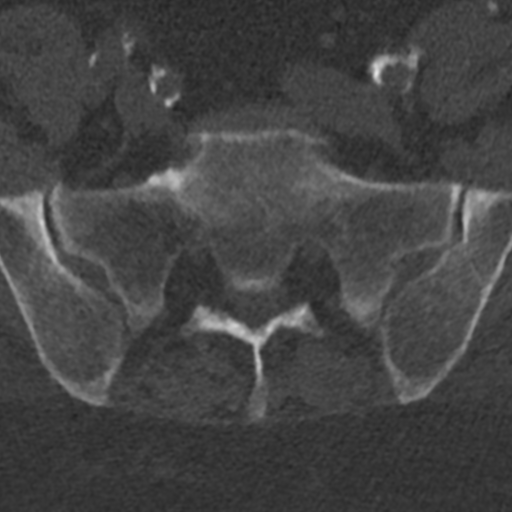
[im 119/155  bone]
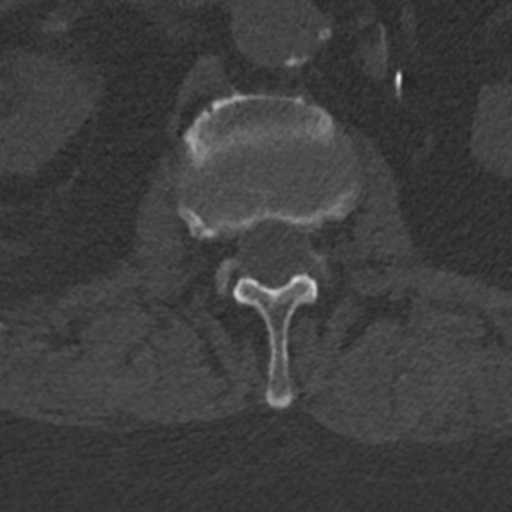

[Series 9: coronal bone · coronal · 0.26mm/px · 3 of 81 slices shown]
[im 17/81  bone]
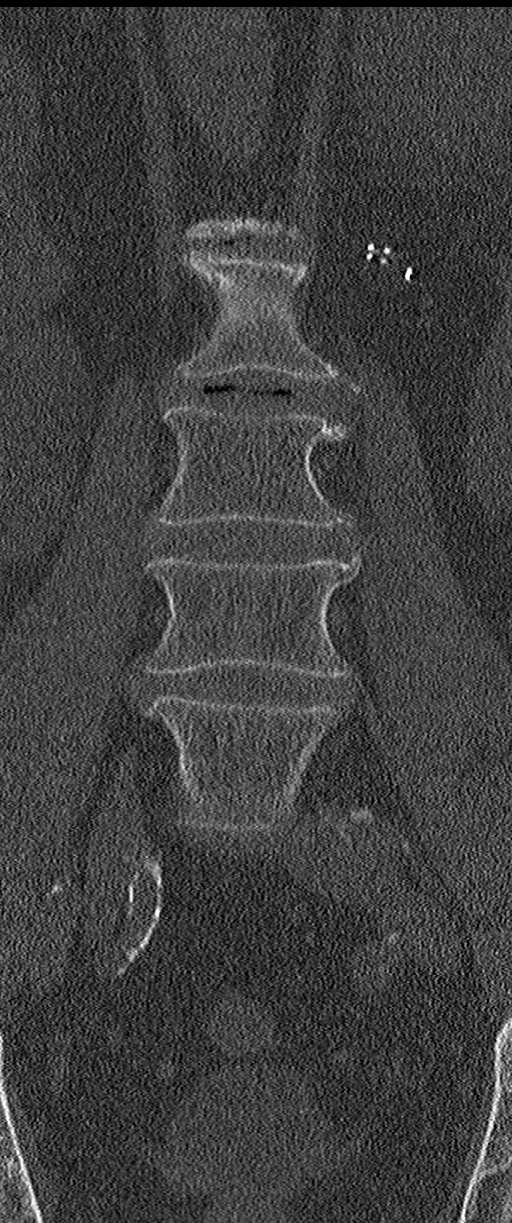
[im 33/81  bone]
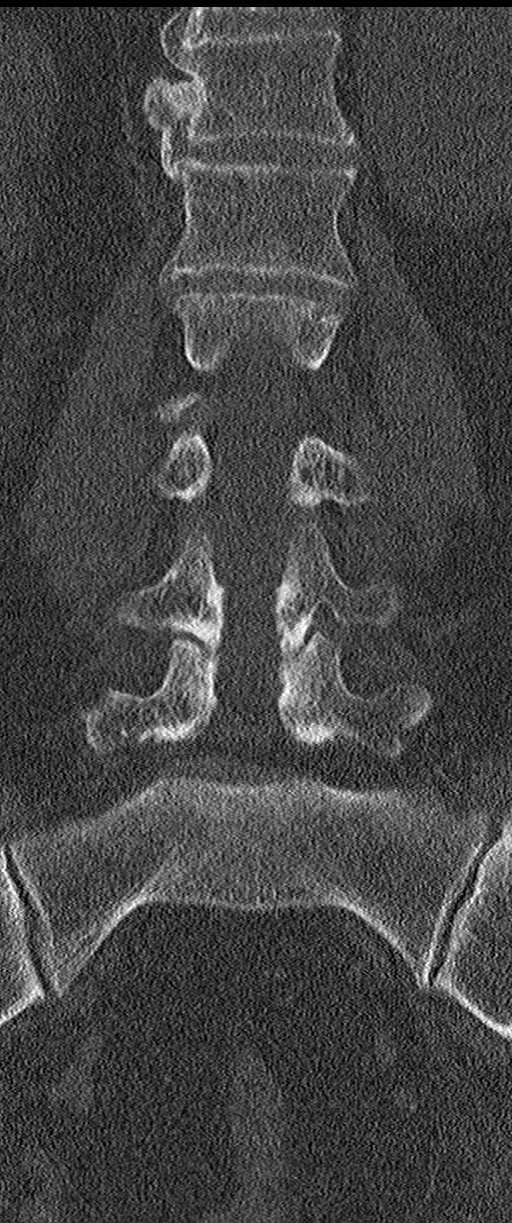
[im 49/81  bone]
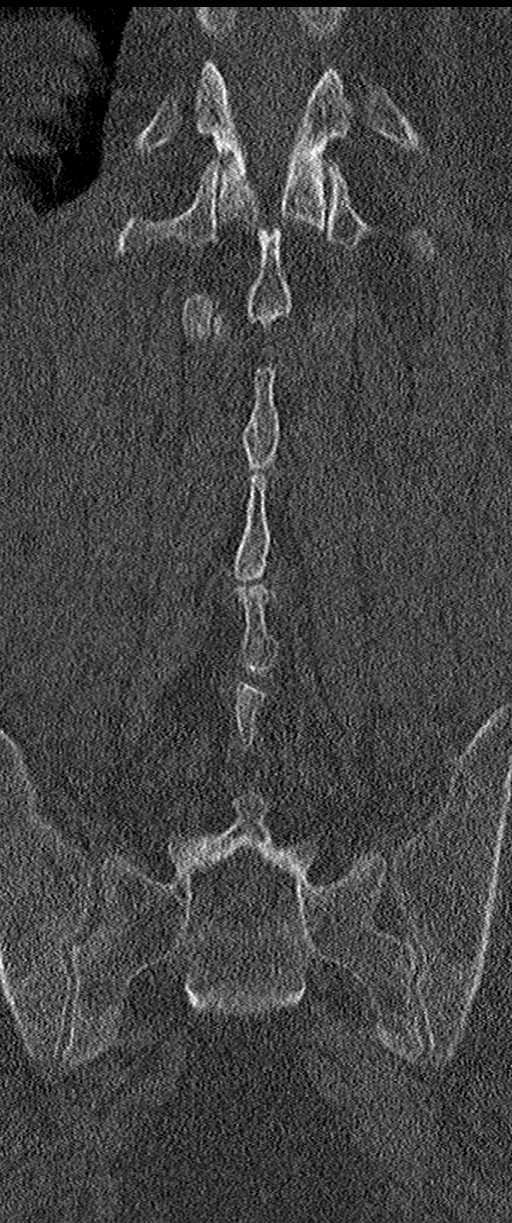

[Series 10: sagittal bone · sagittal · 0.31mm/px · 5 of 67 slices shown]
[im 23/67  bone]
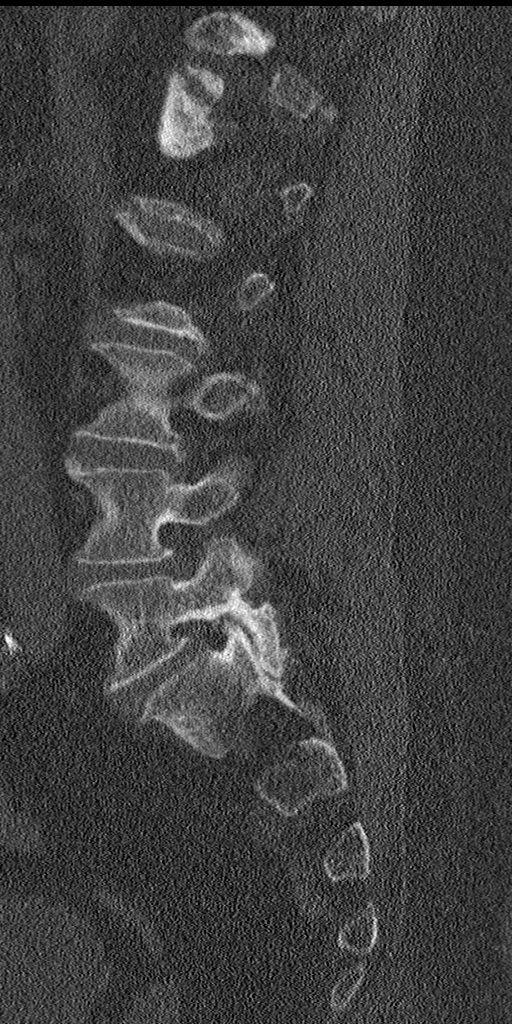
[im 28/67  bone]
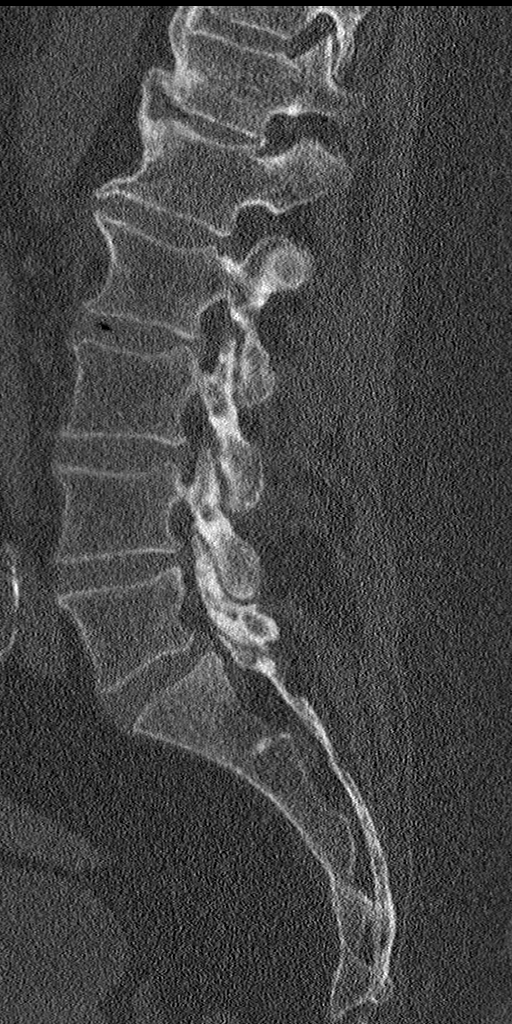
[im 34/67  bone]
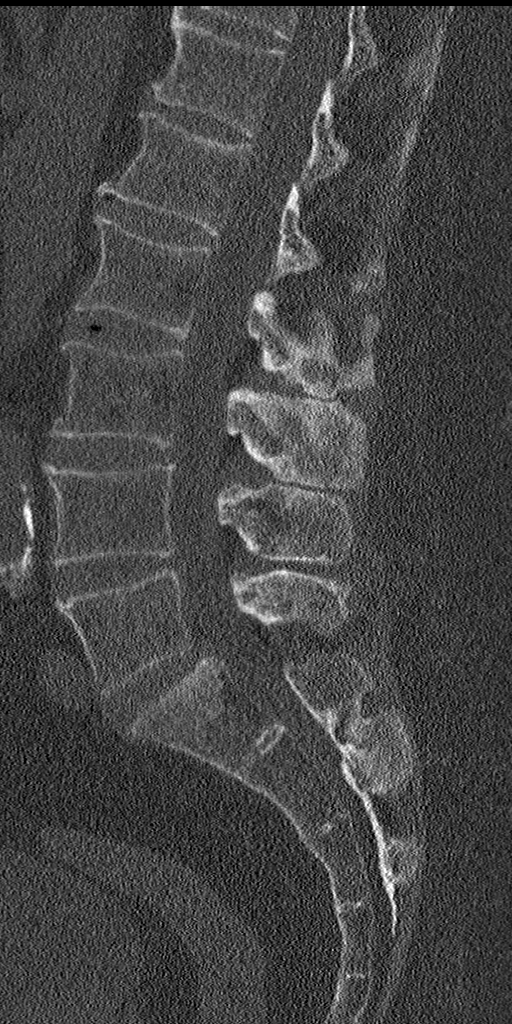
[im 39/67  bone]
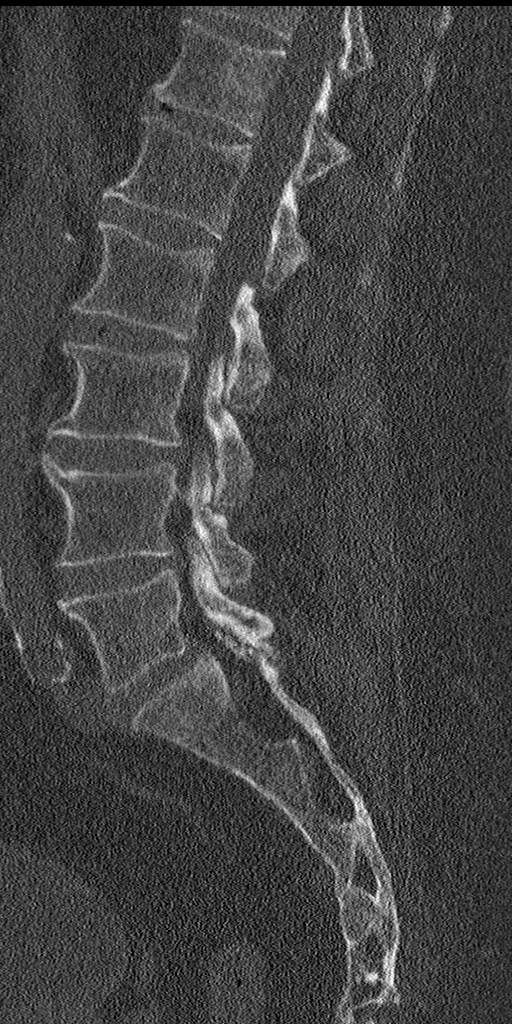
[im 45/67  bone]
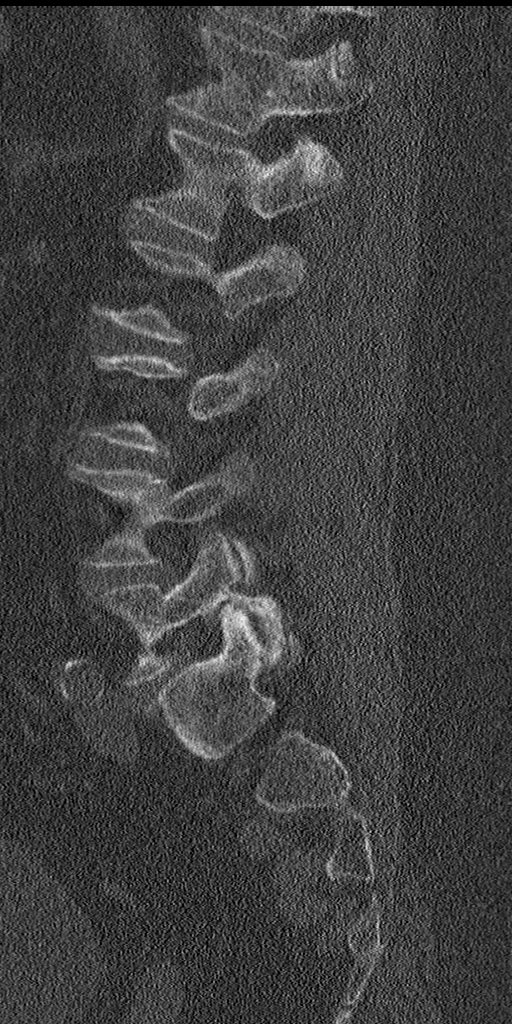

[10 of 33 positions shown; findings below may reference images not displayed]

FINDINGS: Segmentation: Normal

Alignment: Normal

Vertebrae: Negative for fracture

Paraspinal and other soft tissues: Large right renal cyst,
incompletely evaluated. Left nephrectomy. Large cyst in the left
adrenal region with interval enlargement since 7528. Incomplete
evaluation.

Atherosclerotic aorta, incompletely evaluated. Atherosclerotic
calcification in the iliac arteries bilaterally. Prostate
enlargement. No enlarged retroperitoneal lymph nodes identified.

Disc levels: L1-2: Mild degenerative change without stenosis

L2-3: Mild disc bulging. Bilateral facet degeneration with mild
spinal stenosis

L3-4: Disc bulging and mild facet degeneration. Mild spinal stenosis

L4-5: Diffuse disc bulging. Bilateral facet degeneration with mild
spinal stenosis.

L5-S1: Moderate facet degeneration bilaterally. Negative for
stenosis.
IMPRESSION: Negative for lumbar fracture.

Multilevel degenerative changes above

Left nephrectomy. Enlarging cyst in the region of left adrenal gland
compared with 7528.

## 2021-01-27 IMAGING — CR DG HIP (WITH OR WITHOUT PELVIS) 2-3V*R*
3 series · 3 of 3 positions shown · non-contrast
Comparison: Radiograph 05/19/2019

CLINICAL DATA: Pt states right hip pain after his legs gave out
this am and he fell, states he can't walk at this time since
fall/due to pain.

EXAM:
DG HIP (WITH OR WITHOUT PELVIS) 2-3V RIGHT

[pelvis ap (1 of 2)]
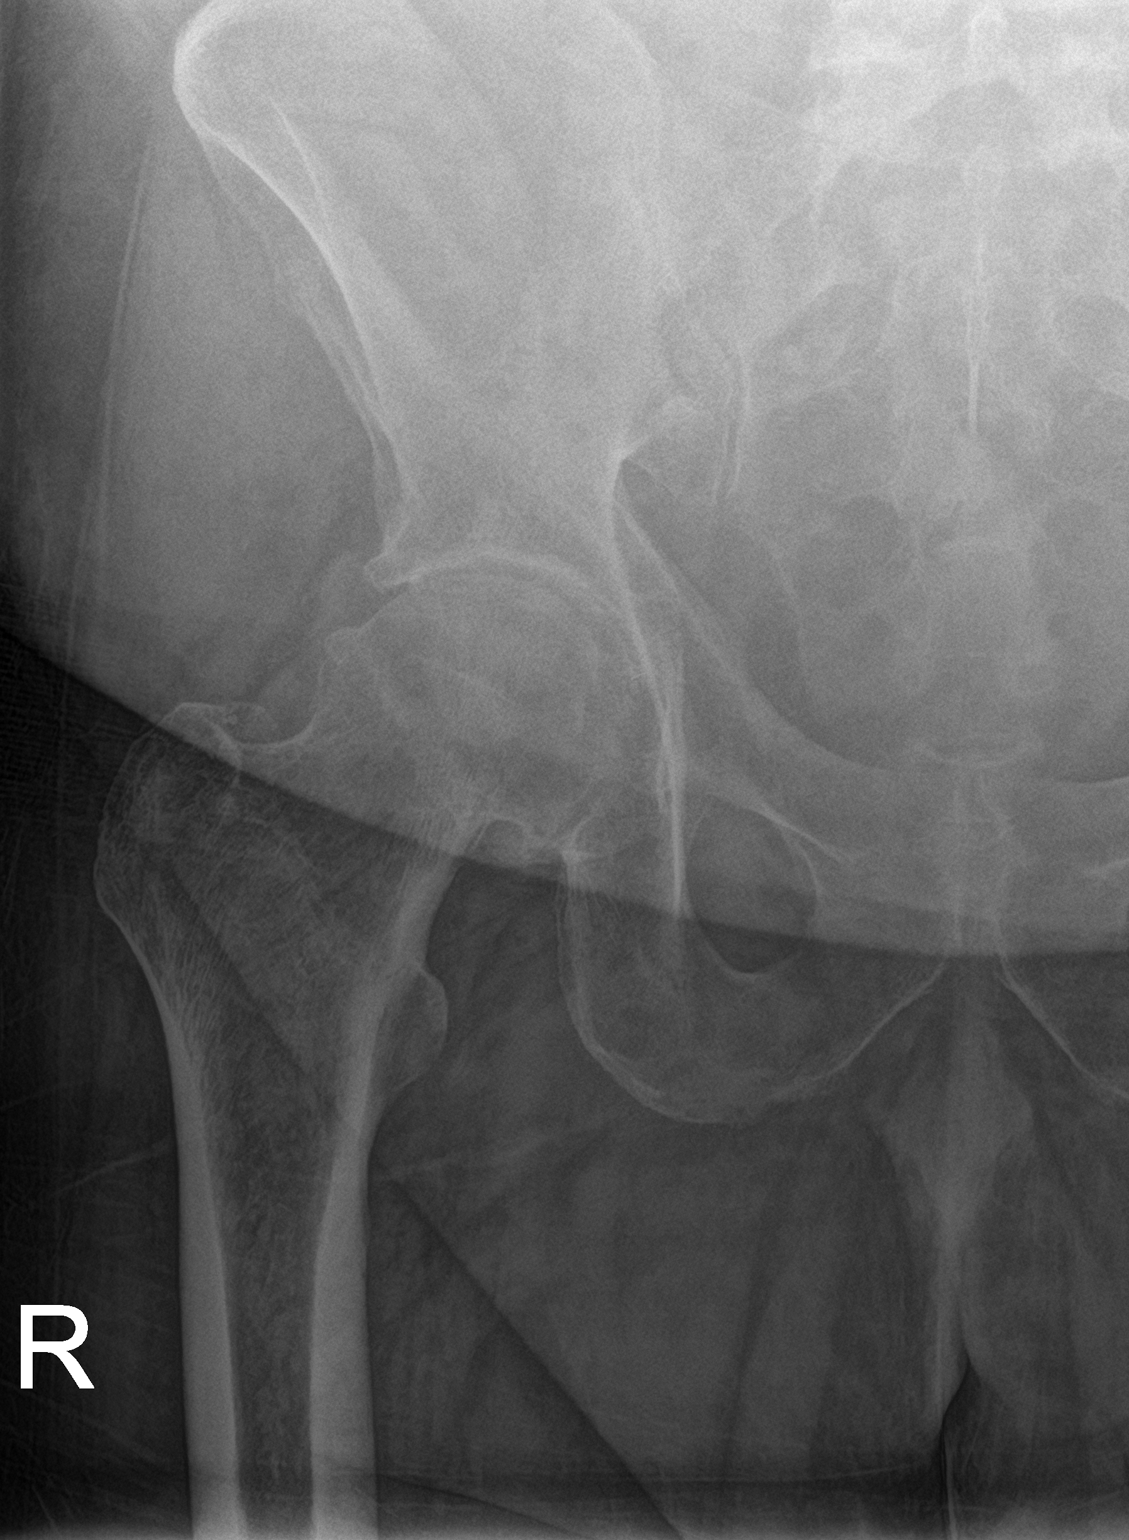

[hip lat]
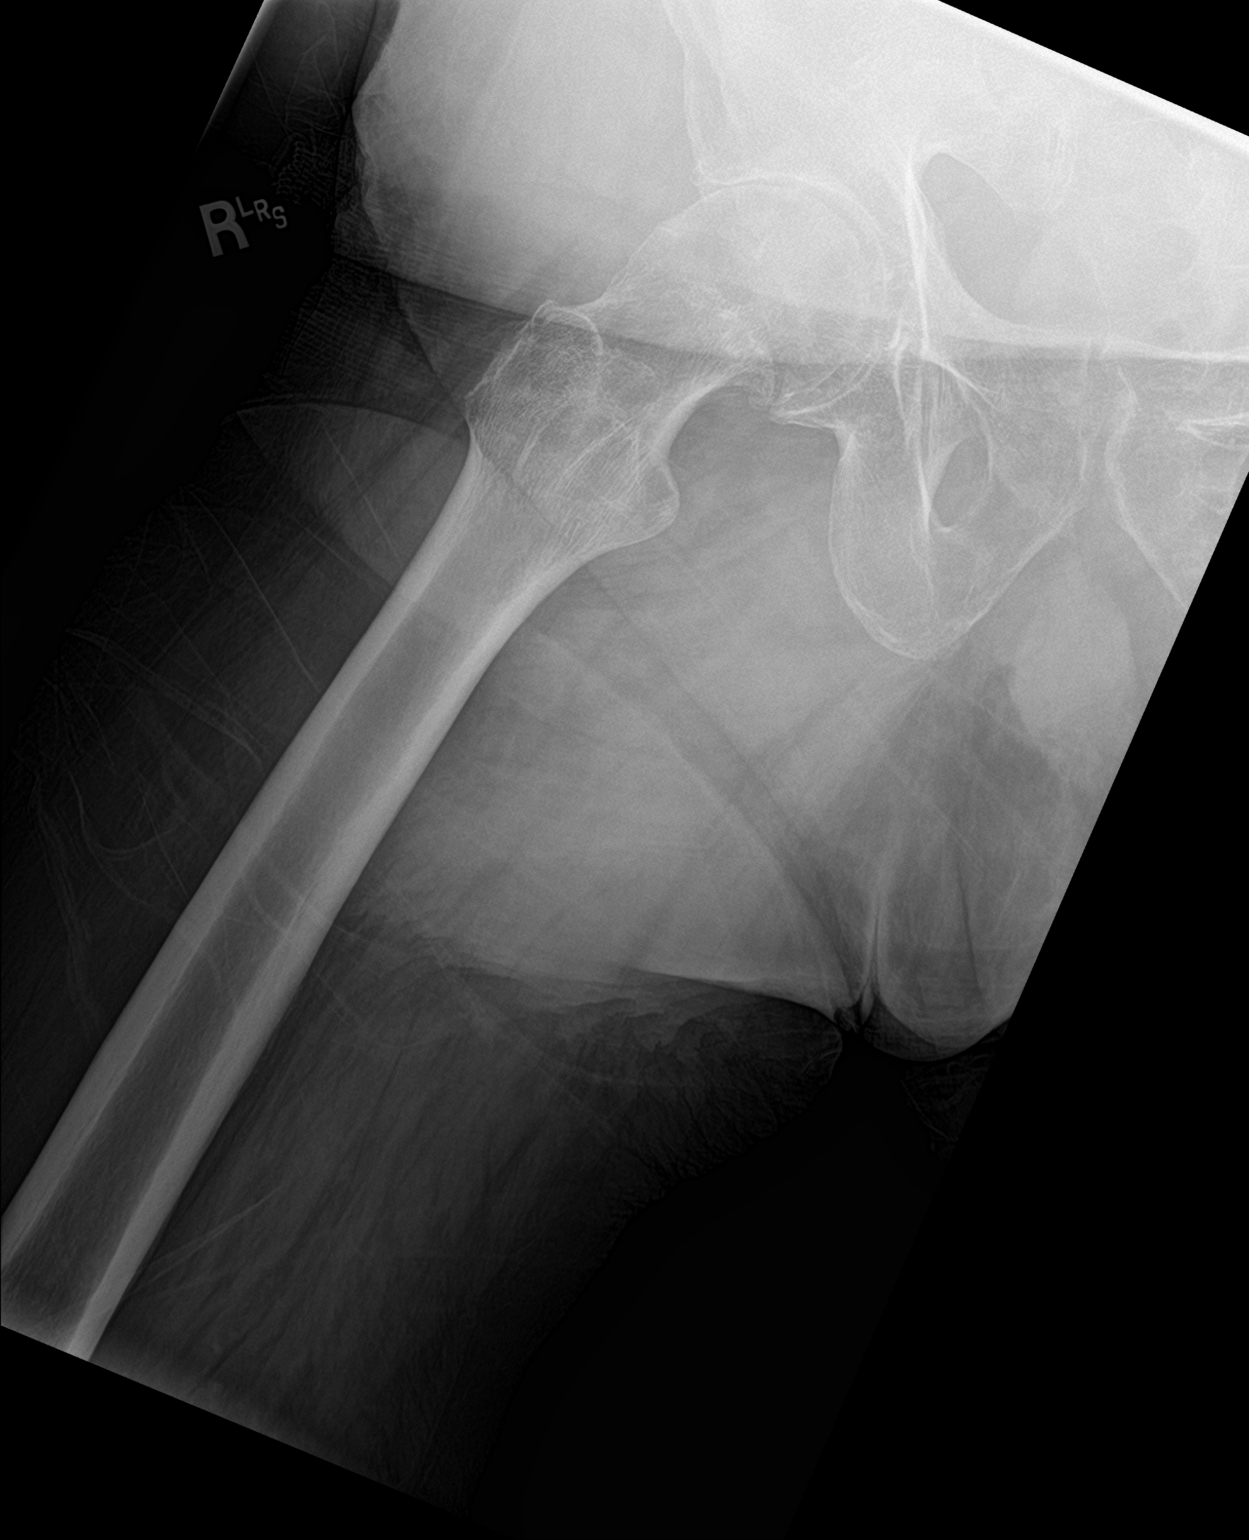

[pelvis ap (2 of 2)]
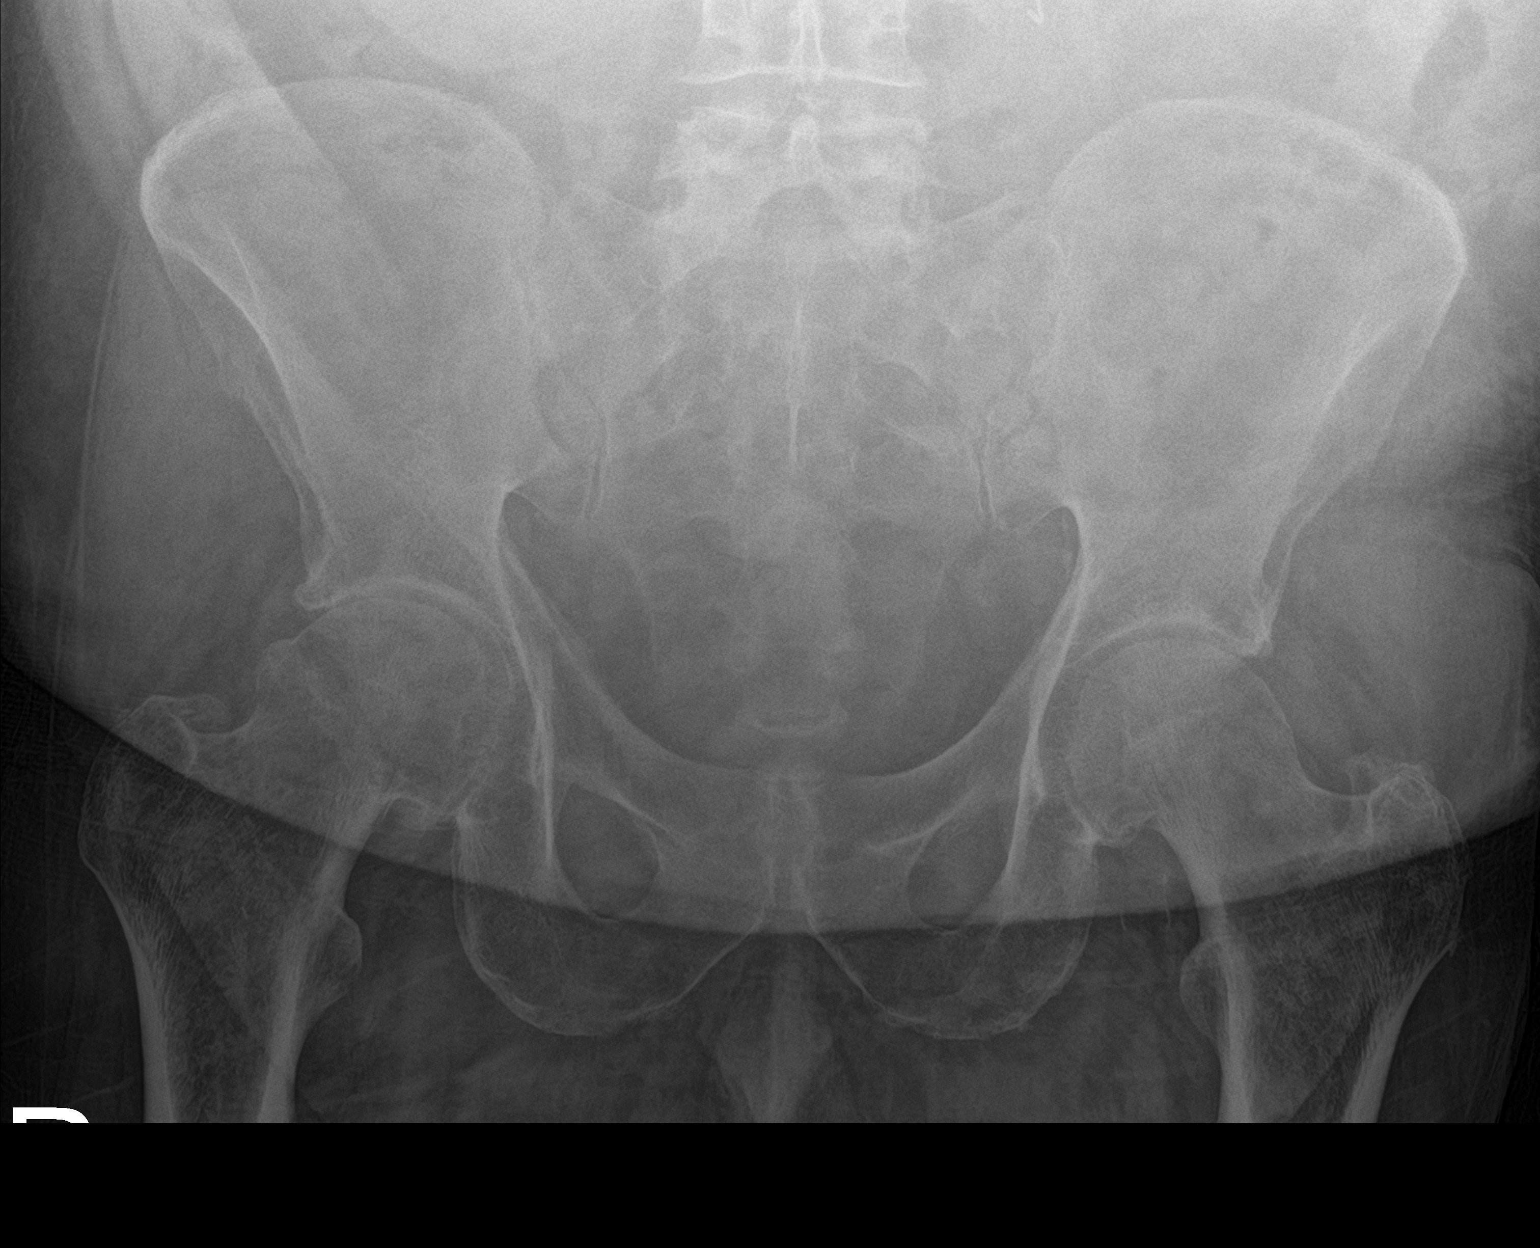

[3 of 3 positions shown; findings below may reference images not displayed]

FINDINGS: Hips are located.  No pelvic fracture.

Dedicated view of the RIGHT hip demonstrates a ring of osteophytes
at the around the femoral neck not changed from comparison exam. No
evidence of acute fracture dislocation.
IMPRESSION: 1. Ring of osteophytes around the RIGHT femoral neck similar to
comparison radiograph.
2. Severe joint space narrowing at the RIGHT hip joint.
Osteoarthritis of the hip joints, RIGHT greater than LEFT.
3. No acute fracture dislocation.

## 2021-01-27 IMAGING — CR DG CHEST 2V
1 series · 2 of 2 positions shown · non-contrast
Comparison: Chest radiographs 05/19/2019 and earlier.

CLINICAL DATA: 63-year-old male with fall, cough, hemoptysis.

EXAM:
CHEST - 2 VIEW

[Series 1: dg chest 2 view · 0.14mm/px · 2 of 2 slices shown]
[im 1/2]
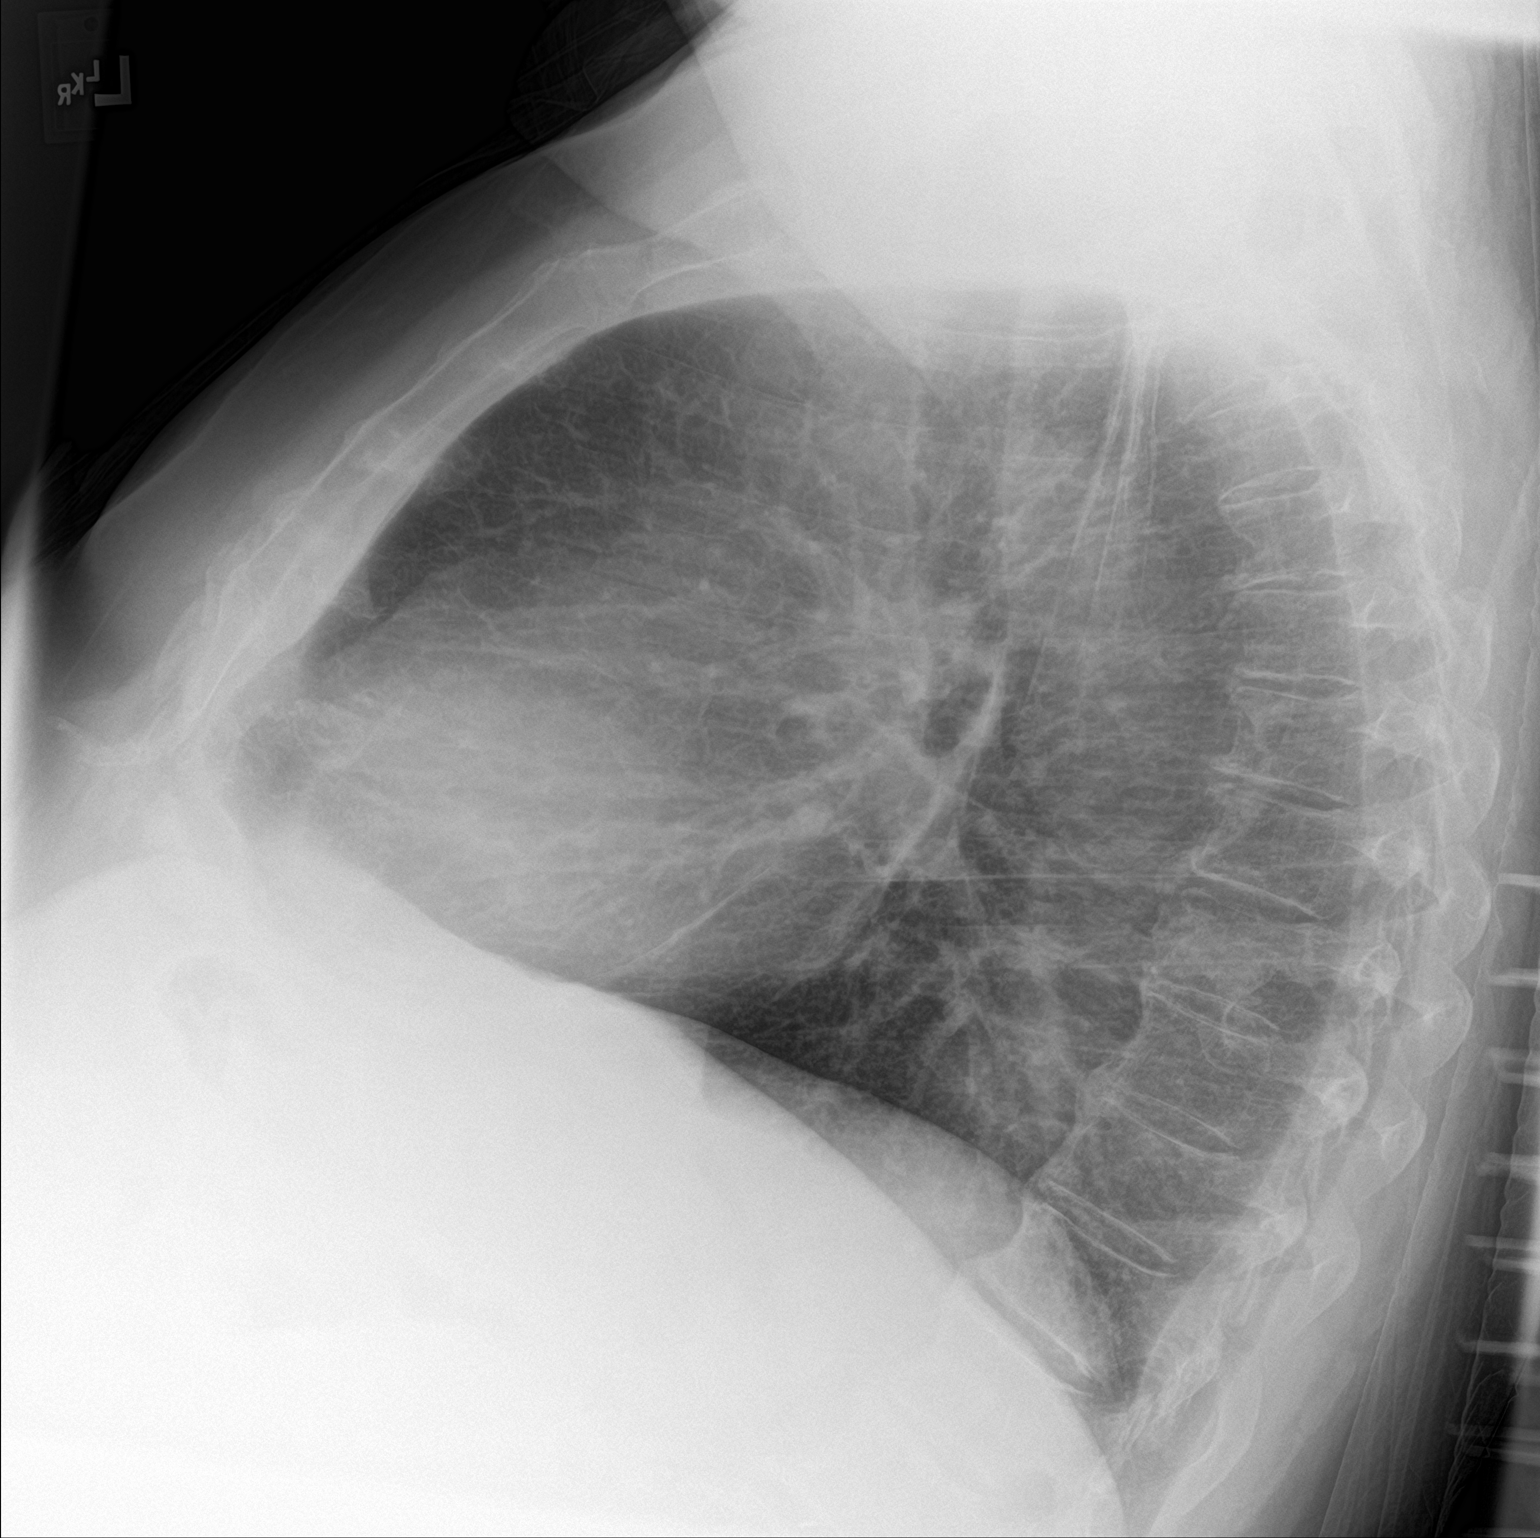
[im 2/2]
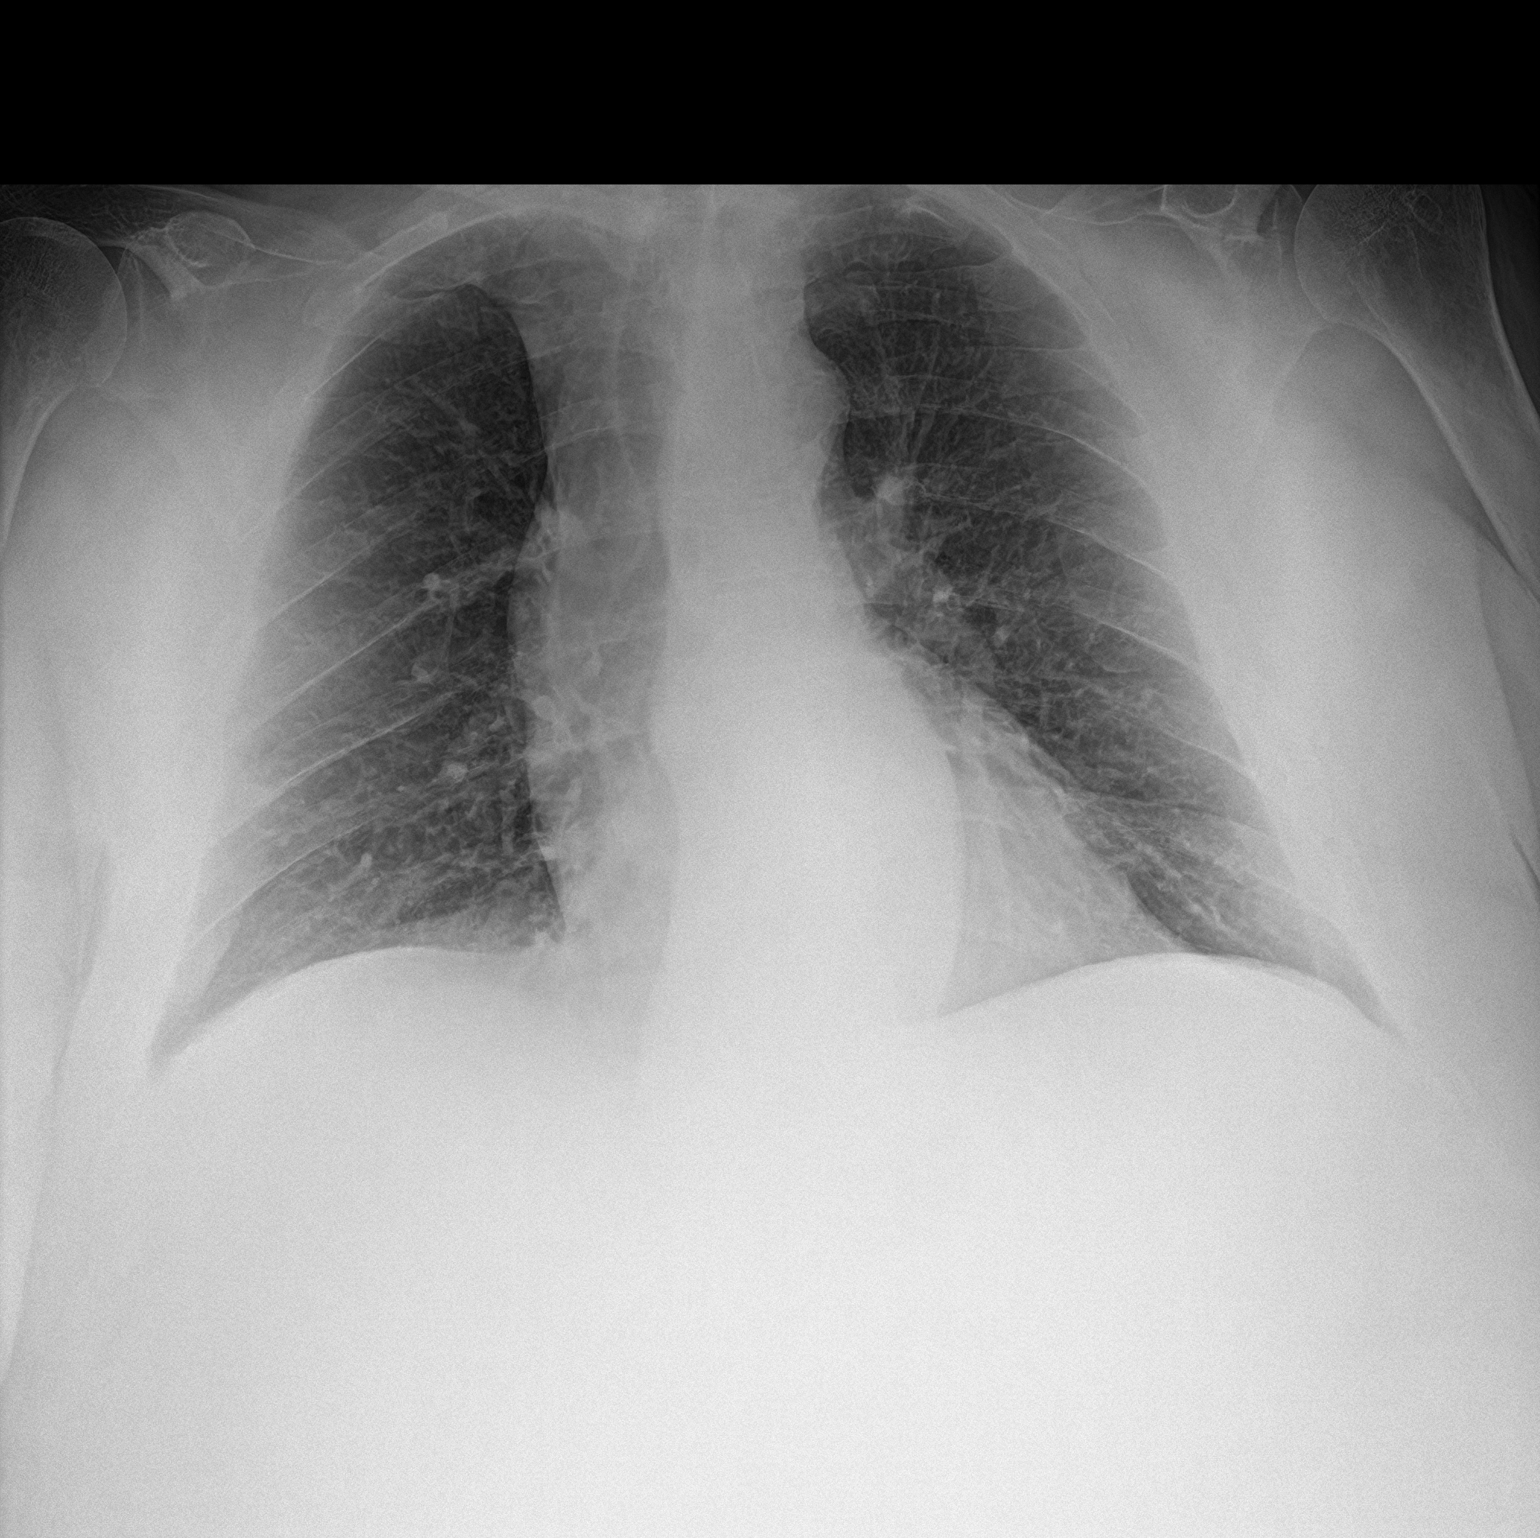

[2 of 2 positions shown; findings below may reference images not displayed]

FINDINGS: Seated AP and lateral views of the chest. Patchy and asymmetric
lower lung opacity suspected last month has resolved. No
pneumothorax, pulmonary edema, pleural effusion or confluent
pulmonary opacity. Mediastinal contours are stable and within normal
limits. Visualized tracheal air column is within normal limits.
Stable visualized osseous structures. Paucity of bowel gas in the
upper abdomen.
IMPRESSION: No acute cardiopulmonary abnormality.

## 2021-01-28 ENCOUNTER — Other Ambulatory Visit: Payer: Self-pay

## 2021-01-28 ENCOUNTER — Observation Stay
Admission: EM | Admit: 2021-01-28 | Discharge: 2021-01-29 | Disposition: A | Discharge status: Home / Self Care | Payer: Medicare PPO | Attending: Internal Medicine | Admitting: Internal Medicine

## 2021-01-28 ENCOUNTER — Emergency Department: Payer: Medicare PPO

## 2021-01-28 DIAGNOSIS — N183 Chronic kidney disease, stage 3 unspecified: Secondary | ICD-10-CM | POA: Diagnosis present

## 2021-01-28 DIAGNOSIS — Z79899 Other long term (current) drug therapy: Secondary | ICD-10-CM | POA: Insufficient documentation

## 2021-01-28 DIAGNOSIS — Z794 Long term (current) use of insulin: Secondary | ICD-10-CM | POA: Insufficient documentation

## 2021-01-28 DIAGNOSIS — R200 Anesthesia of skin: Secondary | ICD-10-CM | POA: Diagnosis not present

## 2021-01-28 DIAGNOSIS — Z7901 Long term (current) use of anticoagulants: Secondary | ICD-10-CM | POA: Diagnosis not present

## 2021-01-28 DIAGNOSIS — Z8616 Personal history of COVID-19: Secondary | ICD-10-CM | POA: Insufficient documentation

## 2021-01-28 DIAGNOSIS — E1122 Type 2 diabetes mellitus with diabetic chronic kidney disease: Secondary | ICD-10-CM | POA: Diagnosis not present

## 2021-01-28 DIAGNOSIS — I5022 Chronic systolic (congestive) heart failure: Secondary | ICD-10-CM | POA: Diagnosis present

## 2021-01-28 DIAGNOSIS — I2510 Atherosclerotic heart disease of native coronary artery without angina pectoris: Secondary | ICD-10-CM | POA: Diagnosis not present

## 2021-01-28 DIAGNOSIS — I611 Nontraumatic intracerebral hemorrhage in hemisphere, cortical: Secondary | ICD-10-CM | POA: Diagnosis not present

## 2021-01-28 DIAGNOSIS — I5042 Chronic combined systolic (congestive) and diastolic (congestive) heart failure: Secondary | ICD-10-CM | POA: Diagnosis not present

## 2021-01-28 DIAGNOSIS — I10 Essential (primary) hypertension: Secondary | ICD-10-CM | POA: Diagnosis present

## 2021-01-28 DIAGNOSIS — R29818 Other symptoms and signs involving the nervous system: Secondary | ICD-10-CM | POA: Diagnosis not present

## 2021-01-28 DIAGNOSIS — R042 Hemoptysis: Secondary | ICD-10-CM | POA: Diagnosis not present

## 2021-01-28 DIAGNOSIS — I4892 Unspecified atrial flutter: Secondary | ICD-10-CM | POA: Diagnosis present

## 2021-01-28 DIAGNOSIS — Z20822 Contact with and (suspected) exposure to covid-19: Secondary | ICD-10-CM | POA: Insufficient documentation

## 2021-01-28 DIAGNOSIS — E669 Obesity, unspecified: Secondary | ICD-10-CM | POA: Diagnosis present

## 2021-01-28 DIAGNOSIS — R202 Paresthesia of skin: Secondary | ICD-10-CM | POA: Diagnosis not present

## 2021-01-28 DIAGNOSIS — R0789 Other chest pain: Secondary | ICD-10-CM | POA: Diagnosis not present

## 2021-01-28 DIAGNOSIS — I130 Hypertensive heart and chronic kidney disease with heart failure and stage 1 through stage 4 chronic kidney disease, or unspecified chronic kidney disease: Secondary | ICD-10-CM | POA: Diagnosis not present

## 2021-01-28 DIAGNOSIS — Z7984 Long term (current) use of oral hypoglycemic drugs: Secondary | ICD-10-CM | POA: Insufficient documentation

## 2021-01-28 DIAGNOSIS — J441 Chronic obstructive pulmonary disease with (acute) exacerbation: Principal | ICD-10-CM | POA: Insufficient documentation

## 2021-01-28 DIAGNOSIS — R079 Chest pain, unspecified: Secondary | ICD-10-CM | POA: Diagnosis not present

## 2021-01-28 DIAGNOSIS — J90 Pleural effusion, not elsewhere classified: Secondary | ICD-10-CM | POA: Diagnosis not present

## 2021-01-28 DIAGNOSIS — I6782 Cerebral ischemia: Secondary | ICD-10-CM | POA: Diagnosis not present

## 2021-01-28 DIAGNOSIS — J449 Chronic obstructive pulmonary disease, unspecified: Secondary | ICD-10-CM | POA: Diagnosis not present

## 2021-01-28 DIAGNOSIS — Z87891 Personal history of nicotine dependence: Secondary | ICD-10-CM | POA: Diagnosis not present

## 2021-01-28 DIAGNOSIS — N1832 Chronic kidney disease, stage 3b: Secondary | ICD-10-CM | POA: Insufficient documentation

## 2021-01-28 DIAGNOSIS — J811 Chronic pulmonary edema: Secondary | ICD-10-CM | POA: Diagnosis not present

## 2021-01-28 DIAGNOSIS — R531 Weakness: Secondary | ICD-10-CM

## 2021-01-28 DIAGNOSIS — R0602 Shortness of breath: Secondary | ICD-10-CM | POA: Diagnosis not present

## 2021-01-28 DIAGNOSIS — R059 Cough, unspecified: Secondary | ICD-10-CM | POA: Diagnosis not present

## 2021-01-28 DIAGNOSIS — N184 Chronic kidney disease, stage 4 (severe): Secondary | ICD-10-CM | POA: Diagnosis present

## 2021-01-28 LAB — RESP PANEL BY RT-PCR (FLU A&B, COVID) ARPGX2
Influenza A by PCR: NEGATIVE
Influenza B by PCR: NEGATIVE
SARS Coronavirus 2 by RT PCR: NEGATIVE

## 2021-01-28 LAB — D-DIMER, QUANTITATIVE: D-Dimer, Quant: 0.67 ug/mL-FEU — ABNORMAL HIGH (ref 0.00–0.50)

## 2021-01-28 LAB — BASIC METABOLIC PANEL
Anion gap: 8 (ref 5–15)
BUN: 35 mg/dL — ABNORMAL HIGH (ref 8–23)
CO2: 24 mmol/L (ref 22–32)
Calcium: 8.7 mg/dL — ABNORMAL LOW (ref 8.9–10.3)
Chloride: 104 mmol/L (ref 98–111)
Creatinine, Ser: 1.94 mg/dL — ABNORMAL HIGH (ref 0.61–1.24)
GFR, Estimated: 37 mL/min — ABNORMAL LOW (ref >60)
Glucose, Bld: 170 mg/dL — ABNORMAL HIGH (ref 70–99)
Potassium: 4.6 mmol/L (ref 3.5–5.1)
Sodium: 136 mmol/L (ref 135–145)

## 2021-01-28 LAB — TYPE AND SCREEN
ABO/RH(D): A POS
Antibody Screen: NEGATIVE

## 2021-01-28 LAB — CBC
HCT: 32.8 % — ABNORMAL LOW (ref 39.0–52.0)
Hemoglobin: 10.5 g/dL — ABNORMAL LOW (ref 13.0–17.0)
MCH: 27.3 pg (ref 26.0–34.0)
MCHC: 32 g/dL (ref 30.0–36.0)
MCV: 85.4 fL (ref 80.0–100.0)
Platelets: 190 10*3/uL (ref 150–400)
RBC: 3.84 MIL/uL — ABNORMAL LOW (ref 4.22–5.81)
RDW: 15.1 % (ref 11.5–15.5)
WBC: 5.3 10*3/uL (ref 4.0–10.5)
nRBC: 0 % (ref 0.0–0.2)

## 2021-01-28 LAB — CBG MONITORING, ED: Glucose-Capillary: 306 mg/dL — ABNORMAL HIGH (ref 70–99)

## 2021-01-28 LAB — TROPONIN I (HIGH SENSITIVITY)
Troponin I (High Sensitivity): 9 ng/L (ref <18)
Troponin I (High Sensitivity): 9 ng/L (ref <18)

## 2021-01-28 LAB — LIPASE, BLOOD: Lipase: 32 U/L (ref 11–51)

## 2021-01-28 MED ORDER — PANTOPRAZOLE SODIUM 40 MG PO TBEC
40.0000 mg | DELAYED_RELEASE_TABLET | Freq: Every day | ORAL | Status: DC
  Administered 2021-01-28 – 2021-01-29 (×2): 40 mg via ORAL
  Filled 2021-01-28 (×3): qty 1

## 2021-01-28 MED ORDER — GLIMEPIRIDE 2 MG PO TABS
2.0000 mg | ORAL_TABLET | Freq: Every day | ORAL | Status: DC
  Administered 2021-01-29: 2 mg via ORAL
  Filled 2021-01-28 (×3): qty 1

## 2021-01-28 MED ORDER — IOHEXOL 350 MG/ML SOLN
60.0000 mL | Freq: Once | INTRAVENOUS | Status: AC | PRN
  Administered 2021-01-28: 60 mL via INTRAVENOUS
  Filled 2021-01-28: qty 60

## 2021-01-28 MED ORDER — UMECLIDINIUM-VILANTEROL 62.5-25 MCG/INH IN AEPB: 1.0000 | INHALATION_SPRAY | Freq: Every day | RESPIRATORY_TRACT | Status: DC

## 2021-01-28 MED ORDER — METHYLPREDNISOLONE SODIUM SUCC 40 MG IJ SOLR
40.0000 mg | INTRAMUSCULAR | Status: DC
Start: 2021-01-28 — End: 2021-01-28

## 2021-01-28 MED ORDER — TIOTROPIUM BROMIDE MONOHYDRATE 18 MCG IN CAPS: 18.0000 ug | ORAL_CAPSULE | Freq: Every day | RESPIRATORY_TRACT | Status: DC

## 2021-01-28 MED ORDER — METHYLPREDNISOLONE SODIUM SUCC 125 MG IJ SOLR
125.0000 mg | Freq: Once | INTRAMUSCULAR | Status: AC
  Administered 2021-01-28: 125 mg via INTRAVENOUS
  Filled 2021-01-28: qty 2

## 2021-01-28 MED ORDER — ISOSORBIDE MONONITRATE ER 60 MG PO TB24
30.0000 mg | ORAL_TABLET | Freq: Two times a day (BID) | ORAL | Status: DC
  Administered 2021-01-28 – 2021-01-29 (×2): 30 mg via ORAL
  Filled 2021-01-28 (×2): qty 1

## 2021-01-28 MED ORDER — ROSUVASTATIN CALCIUM 20 MG PO TABS
10.0000 mg | ORAL_TABLET | Freq: Every day | ORAL | Status: DC
  Administered 2021-01-28: 10 mg via ORAL
  Filled 2021-01-28: qty 1

## 2021-01-28 MED ORDER — IPRATROPIUM-ALBUTEROL 0.5-2.5 (3) MG/3ML IN SOLN
3.0000 mL | Freq: Four times a day (QID) | RESPIRATORY_TRACT | Status: DC
  Administered 2021-01-28 – 2021-01-29 (×3): 3 mL via RESPIRATORY_TRACT
  Filled 2021-01-28 (×3): qty 3

## 2021-01-28 MED ORDER — HYDROCODONE-ACETAMINOPHEN 5-325 MG PO TABS: 1.0000 | ORAL_TABLET | ORAL | Status: DC | PRN

## 2021-01-28 MED ORDER — GABAPENTIN 100 MG PO CAPS
100.0000 mg | ORAL_CAPSULE | Freq: Three times a day (TID) | ORAL | Status: DC
  Administered 2021-01-28 – 2021-01-29 (×3): 100 mg via ORAL
  Filled 2021-01-28 (×3): qty 1

## 2021-01-28 MED ORDER — FUROSEMIDE 40 MG PO TABS
40.0000 mg | ORAL_TABLET | Freq: Every day | ORAL | Status: DC
  Administered 2021-01-29: 40 mg via ORAL
  Filled 2021-01-28: qty 1

## 2021-01-28 MED ORDER — ALUM & MAG HYDROXIDE-SIMETH 200-200-20 MG/5ML PO SUSP
30.0000 mL | Freq: Once | ORAL | Status: AC
  Administered 2021-01-28: 30 mL via ORAL
  Filled 2021-01-28: qty 30

## 2021-01-28 MED ORDER — LIDOCAINE VISCOUS HCL 2 % MT SOLN
15.0000 mL | Freq: Once | OROMUCOSAL | Status: AC
  Administered 2021-01-28: 15 mL via ORAL
  Filled 2021-01-28: qty 15

## 2021-01-28 MED ORDER — PREDNISONE 20 MG PO TABS: 40.0000 mg | ORAL_TABLET | Freq: Every day | ORAL | Status: DC

## 2021-01-28 MED ORDER — MORPHINE SULFATE (PF) 2 MG/ML IV SOLN
2.0000 mg | INTRAVENOUS | Status: DC | PRN
Start: 2021-01-28 — End: 2021-01-29

## 2021-01-28 MED ORDER — INSULIN GLARGINE-YFGN 100 UNIT/ML ~~LOC~~ SOLN
26.0000 [IU] | Freq: Every day | SUBCUTANEOUS | Status: DC
  Administered 2021-01-29: 26 [IU] via SUBCUTANEOUS
  Filled 2021-01-28 (×2): qty 0.26

## 2021-01-28 MED ORDER — IPRATROPIUM-ALBUTEROL 0.5-2.5 (3) MG/3ML IN SOLN
3.0000 mL | Freq: Once | RESPIRATORY_TRACT | Status: AC
  Administered 2021-01-28: 3 mL via RESPIRATORY_TRACT

## 2021-01-28 MED ORDER — CARVEDILOL 6.25 MG PO TABS
12.5000 mg | ORAL_TABLET | Freq: Two times a day (BID) | ORAL | Status: DC
  Administered 2021-01-28 – 2021-01-29 (×3): 12.5 mg via ORAL
  Filled 2021-01-28 (×4): qty 2

## 2021-01-28 MED ORDER — ONDANSETRON HCL 4 MG/2ML IJ SOLN: 4.0000 mg | Freq: Four times a day (QID) | INTRAMUSCULAR | Status: DC | PRN

## 2021-01-28 MED ORDER — POTASSIUM CHLORIDE CRYS ER 20 MEQ PO TBCR
20.0000 meq | EXTENDED_RELEASE_TABLET | Freq: Two times a day (BID) | ORAL | Status: DC
Start: 2021-01-28 — End: 2021-01-29
  Administered 2021-01-28 – 2021-01-29 (×2): 20 meq via ORAL
  Filled 2021-01-28 (×2): qty 1

## 2021-01-28 MED ORDER — APIXABAN 2.5 MG PO TABS
2.5000 mg | ORAL_TABLET | Freq: Two times a day (BID) | ORAL | Status: DC
  Administered 2021-01-28 – 2021-01-29 (×2): 2.5 mg via ORAL
  Filled 2021-01-28 (×4): qty 1

## 2021-01-28 MED ORDER — LORATADINE 10 MG PO TABS
10.0000 mg | ORAL_TABLET | Freq: Every day | ORAL | Status: DC
  Administered 2021-01-29: 10 mg via ORAL
  Filled 2021-01-28: qty 1

## 2021-01-28 MED ORDER — DOXYCYCLINE HYCLATE 100 MG PO TABS
100.0000 mg | ORAL_TABLET | Freq: Two times a day (BID) | ORAL | Status: DC
  Administered 2021-01-28 – 2021-01-29 (×2): 100 mg via ORAL
  Filled 2021-01-28 (×2): qty 1

## 2021-01-28 MED ORDER — ONDANSETRON HCL 4 MG PO TABS: 4.0000 mg | ORAL_TABLET | Freq: Four times a day (QID) | ORAL | Status: DC | PRN

## 2021-01-28 MED ORDER — METHYLPREDNISOLONE SODIUM SUCC 40 MG IJ SOLR
40.0000 mg | INTRAMUSCULAR | Status: DC
Start: 2021-01-29 — End: 2021-01-29

## 2021-01-28 MED ORDER — INSULIN ASPART 100 UNIT/ML IJ SOLN
0.0000 [IU] | Freq: Every day | INTRAMUSCULAR | Status: DC
  Administered 2021-01-28: 4 [IU] via SUBCUTANEOUS
  Filled 2021-01-28: qty 1

## 2021-01-28 MED ORDER — FLUTICASONE FUROATE-VILANTEROL 200-25 MCG/INH IN AEPB
1.0000 | INHALATION_SPRAY | Freq: Every day | RESPIRATORY_TRACT | Status: DC
  Administered 2021-01-29: 1 via RESPIRATORY_TRACT
  Filled 2021-01-28: qty 28

## 2021-01-28 MED ORDER — INSULIN ASPART 100 UNIT/ML IJ SOLN
0.0000 [IU] | Freq: Three times a day (TID) | INTRAMUSCULAR | Status: DC
  Administered 2021-01-29 (×2): 11 [IU] via SUBCUTANEOUS
  Filled 2021-01-28 (×2): qty 1

## 2021-01-28 MED ORDER — VITAMIN B-12 1000 MCG PO TABS
1000.0000 ug | ORAL_TABLET | Freq: Every day | ORAL | Status: DC
  Administered 2021-01-29: 1000 ug via ORAL
  Filled 2021-01-28: qty 1

## 2021-01-28 MED ORDER — ESCITALOPRAM OXALATE 10 MG PO TABS
10.0000 mg | ORAL_TABLET | Freq: Every day | ORAL | Status: DC
  Administered 2021-01-29: 10 mg via ORAL
  Filled 2021-01-28: qty 1

## 2021-01-28 MED ORDER — ACETAMINOPHEN 650 MG RE SUPP: 650.0000 mg | Freq: Four times a day (QID) | RECTAL | Status: DC | PRN

## 2021-01-28 MED ORDER — POLYETHYLENE GLYCOL 3350 17 G PO PACK: 17.0000 g | PACK | Freq: Every day | ORAL | Status: DC | PRN

## 2021-01-28 MED ORDER — SODIUM CHLORIDE 0.9 % IV SOLN: 500.0000 mg | Freq: Once | INTRAVENOUS | Status: DC

## 2021-01-28 MED ORDER — IPRATROPIUM-ALBUTEROL 0.5-2.5 (3) MG/3ML IN SOLN
3.0000 mL | Freq: Once | RESPIRATORY_TRACT | Status: AC
  Administered 2021-01-28: 3 mL via RESPIRATORY_TRACT
  Filled 2021-01-28: qty 9

## 2021-01-28 MED ORDER — GUAIFENESIN ER 600 MG PO TB12
600.0000 mg | ORAL_TABLET | Freq: Two times a day (BID) | ORAL | Status: DC
  Administered 2021-01-28 – 2021-01-29 (×2): 600 mg via ORAL
  Filled 2021-01-28 (×2): qty 1

## 2021-01-28 MED ORDER — HYDRALAZINE HCL 50 MG PO TABS
100.0000 mg | ORAL_TABLET | Freq: Three times a day (TID) | ORAL | Status: DC
  Administered 2021-01-28 – 2021-01-29 (×3): 100 mg via ORAL
  Filled 2021-01-28 (×3): qty 2

## 2021-01-28 MED ORDER — HYDRALAZINE HCL 20 MG/ML IJ SOLN: 10.0000 mg | Freq: Four times a day (QID) | INTRAMUSCULAR | Status: DC | PRN

## 2021-01-28 MED ORDER — ACETAMINOPHEN 325 MG PO TABS: 650.0000 mg | ORAL_TABLET | Freq: Four times a day (QID) | ORAL | Status: DC | PRN

## 2021-01-28 MED ORDER — IPRATROPIUM-ALBUTEROL 0.5-2.5 (3) MG/3ML IN SOLN
3.0000 mL | Freq: Once | RESPIRATORY_TRACT | Status: AC
  Administered 2021-01-28: 3 mL via RESPIRATORY_TRACT
  Filled 2021-01-28: qty 3

## 2021-01-28 NOTE — ED Provider Notes (Signed)
[name] Emergency Department Provider Note  ____________________________________________   I have reviewed the triage vital signs and the nursing notes.   HISTORY  Chief Complaint Chest Pain and Shortness of Breath   History limited by: Not Limited   HPI Larry Callahan is a 66 y.o. male who presents to the emergency department today because of concern for chest pain. It is slightly unclear exactly how long the patient has been having chest pain, but it appears it has been a few days. The patient states that the pain is located in his right chest. Describes it as sharp and stabbing. He has had some associated shortness of breath as well. Furthermore the patient says he had one episode of vomiting up blood earlier today. When he was seen by the social worker today and mentioned the bloody emesis they recommended he come for evaluation. The patient denies any fevers.   Records reviewed. Per medical record review patient has a history of atrial flutter, CHF, COPD, CKD, HLD, HTN, DM.   Past Medical History:  Diagnosis Date   Allergy    Chronic combined systolic (congestive) and diastolic (congestive) heart failure ([place])    a. 04/2019 Echo: EF 45-50%; b. 02/2019 Echo: EF 55-60%; c. 09/2020 Echo: EF 35-40%, glob HK. Nl RV size/fxn. Mild MR.   CKD (chronic kidney disease), stage III (HCC)    COPD (chronic obstructive pulmonary disease) ([place])    Coronary artery disease    a. 2011 Cath: nonobs dzs; b. 09/2020 MV: EF 38%. No signif ischemia. ? inf MI vs diaph attenuation. GI uptake artifact.   Diabetes mellitus without complication ([place])    GERD (gastroesophageal reflux disease)    Hyperlipidemia    Hypertension    Morbid obesity ([place])    NICM (nonischemic cardiomyopathy) ([place])    a. 04/2019 Echo: EF 45-50%; b. 02/2019 Echo: EF 55-60%; c. 09/2020 Echo: EF 35-40%, glob HK; d. 09/2020 MV: No ischemia. ? inf infarct vs attenuation.   OSA (obstructive sleep apnea)      Patient Active Problem List   Diagnosis Date Noted   Atrial flutter ([place]) 01/07/2021   Syncope and collapse 11/16/2020   Demand ischemia ([place])    Influenza A 10/30/2020   Chest pain 10/06/2020   HLD (hyperlipidemia) 10/06/2020   History of CVA (cerebrovascular accident) 09/25/2020   Gastroesophageal reflux disease without esophagitis 04/19/2020   Pneumonia due to COVID-19 virus 01/30/2020   Elevated troponin I level 01/29/2020   Suspected COVID-19 virus infection 01/29/2020   Lightheadedness 01/29/2020   Hematemesis 01/09/2020   Hospital discharge follow-up 11/26/2019   Cellulitis of left hand 09/26/2019   Generalized weakness 08/08/2019   Obesity (BMI 30-39.9) 08/08/2019   Acute right hip pain 06/17/2019   Inability to ambulate due to hip 06/17/2019   CKD stage 3 due to type 2 diabetes mellitus (HCC)    Hypervolemia    Chronic diastolic CHF (congestive heart failure) ([place])    Type 2 diabetes mellitus with stage 3b chronic kidney disease, with long-term current use of insulin ([place])    Hypomagnesemia    Full code status 05/20/2019   Pleuritic chest pain    Multifocal pneumonia 05/19/2019   Acute kidney injury superimposed on CKD 3b ([place]) 05/09/2019   Hemoptysis 04/09/2019   Shortness of breath 03/26/2019   Uncontrolled type 2 diabetes mellitus with insulin therapy ([place]) 03/12/2019   Acute on chronic heart failure with preserved ejection fraction (HFpEF) (HCC)    Stage 3b chronic kidney  disease ([place])    Chronic systolic CHF (congestive heart failure) ([place]) 81/06/7508   Diastolic dysfunction 25/85/2778   Iron deficiency anemia 12/31/2018   Atopic dermatitis 11/24/2018   Screening for colon cancer 11/06/2017   Uncontrolled type 2 diabetes mellitus with hyperglycemia ([place]) 11/06/2017   Hidradenitis 06/09/2017   Atherosclerotic heart disease of native coronary artery without angina pectoris 06/09/2017   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    Obstructive sleep apnea, adult 06/09/2017   Morbid (severe) obesity due to excess calories ([place]) 06/09/2017   Cigarette nicotine dependence with nicotine-induced disorder 06/09/2017   Diabetic polyneuropathy associated with type 2 diabetes mellitus ([place]) 06/09/2017   Allergic rhinitis due to pollen 06/09/2017   Mixed hyperlipidemia 06/09/2017   COPD (chronic obstructive pulmonary disease) ([place]) 06/09/2017   Dyspnea on exertion 06/09/2017   Wheezing 06/09/2017   Dysuria 06/09/2017   Snoring 06/09/2017   Essential hypertension 06/09/2017   Personal history of other malignant neoplasm of kidney 06/09/2017   Pain in right hip 06/09/2017   Impacted cerumen, bilateral 06/09/2017   Secondary hyperparathyroidism, not elsewhere classified ([place]) 06/09/2017   Type 2 diabetes mellitus with hyperglycemia ([place]) 06/09/2017   Tinea corporis 06/09/2017    Past Surgical History:  Procedure Laterality Date   BACK SURGERY     CARDIAC CATHETERIZATION     CHOLECYSTECTOMY     COLONOSCOPY WITH PROPOFOL N/A 04/12/2019   Procedure: COLONOSCOPY WITH PROPOFOL;  Surgeon: [name], MD;  Location: [facility] ENDOSCOPY;  Service: Gastroenterology;  Laterality: N/A;   ESOPHAGOGASTRODUODENOSCOPY N/A 04/12/2019   Procedure: ESOPHAGOGASTRODUODENOSCOPY (EGD);  Surgeon: [name], MD;  Location: [facility] ENDOSCOPY;  Service: Gastroenterology;  Laterality: N/A;   ESOPHAGOGASTRODUODENOSCOPY (EGD) WITH PROPOFOL N/A 11/21/2019   Procedure: ESOPHAGOGASTRODUODENOSCOPY (EGD) WITH PROPOFOL;  Surgeon: [name], MD;  Location: [facility] ENDOSCOPY;  Service: Gastroenterology;  Laterality: N/A;   FLEXIBLE BRONCHOSCOPY Bilateral 05/17/2019   Procedure: FLEXIBLE BRONCHOSCOPY;  Surgeon: [name], MD;  Location: [name] ORS;  Service: Pulmonary;  Laterality: Bilateral;   left arm surgery     nephrectomy Left    PARATHYROIDECTOMY     RIGHT HEART CATH N/A 03/29/2019   Procedure: RIGHT HEART CATH;  Surgeon: [name], MD;  Location: [place] CV LAB;  Service: Cardiovascular;  Laterality: N/A;    Prior to Admission medications   Medication Sig Start Date End Date Taking? Authorizing Provider  albuterol (VENTOLIN HFA) 108 (90 Base) MCG/ACT inhaler Inhale 2 puffs into the lungs every 6 (six) hours as needed for wheezing or shortness of breath. 08/31/20   Lavera Guise, MD  apixaban (ELIQUIS) 2.5 MG TABS tablet Take 1 tablet (2.5 mg total) by mouth 2 (two) times daily. 01/07/21   Arta Silence, MD  Blood Glucose Calibration (TRUE METRIX LEVEL 1) Low SOLN Use as directed 04/04/18   [name], NP  Blood Glucose Monitoring Suppl (TRUE METRIX AIR GLUCOSE METER) DEVI 1 Device by Does not apply route 2 (two) times daily. 04/13/18   [name], NP  budesonide-formoterol (SYMBICORT) 160-4.5 MCG/ACT inhaler Inhale 2 puffs into the lungs 2 (two) times daily. 07/20/20   Lavera Guise, MD  carvedilol (COREG) 12.5 MG tablet Take 1 tablet (12.5 mg total) by mouth 2 (two) times daily. 01/07/21 01/07/22  Arta Silence, MD  cetirizine (ZYRTEC) 10 MG tablet Take 10 mg by mouth daily.    [provider]  escitalopram (LEXAPRO) 10 MG tablet Take one tab po qd for  anxiety with food 07/17/20   Lavera Guise, MD  ferrous gluconate (FERGON) 324 MG tablet Take 1 tablet (324 mg total) by mouth daily with breakfast. 08/01/19   [name], NP  furosemide (LASIX) 40 MG tablet Take one to tabs once or twice days for fluid // swelling 07/17/20   Lavera Guise, MD  gabapentin (NEURONTIN) 100 MG capsule Take 1 capsule (100 mg total) by mouth in the morning, at noon, and at bedtime. 07/17/20   Lavera Guise, MD  glimepiride Jari Sportsman) 2 MG tablet Take one tab po qd with supper 07/17/20   Lavera Guise, MD  glucose blood (TRUE METRIX BLOOD GLUCOSE TEST) test strip Use as instructed twice daily diag E11.65 07/30/19   [name], NP  hydrALAZINE (APRESOLINE) 100 MG tablet Take 1 tablet (100 mg total) by  mouth 3 (three) times daily. 07/17/20   Lavera Guise, MD  insulin glargine, 1 Unit Dial, (TOUJEO) 300 UNIT/ML Solostar Pen Inject 26 Units into the skin daily. 04/30/20   [name], NP  ipratropium-albuterol (DUONEB) 0.5-2.5 (3) MG/3ML SOLN Take 3 mLs by nebulization every 6 (six) hours as needed. 10/11/20   Lavera Guise, MD  isosorbide mononitrate (IMDUR) 30 MG 24 hr tablet Take 1 tablet (30 mg total) by mouth 2 (two) times daily. 10/08/20   Jennye Boroughs, MD  meclizine (ANTIVERT) 25 MG tablet Take 1 tablet (25 mg total) by mouth 3 (three) times daily as needed for dizziness or nausea. 11/25/20   Paulette Blanch, MD  NOVOFINE PEN NEEDLE 32G X 6 MM MISC Use as directed 10/15/20   Lavera Guise, MD  omeprazole (PRILOSEC) 40 MG capsule Take 1 capsule (40 mg total) by mouth in the morning and at bedtime. 01/26/21   Lavera Guise, MD  potassium chloride SA (KLOR-CON) 20 MEQ tablet Take 1 tablet (20 mEq total) by mouth 2 (two) times daily. 07/17/20   Lavera Guise, MD  rosuvastatin (CRESTOR) 10 MG tablet Take 10 mg by mouth at bedtime. 12/12/20   [provider]  tiotropium (SPIRIVA HANDIHALER) 18 MCG inhalation capsule Place 1 capsule (18 mcg total) into inhaler and inhale daily. 11/10/20 02/08/21  Lavera Guise, MD  TRUEplus Lancets 33G MISC Use as directed twice daily diag E11.65 07/30/19   [name], NP  vitamin B-12 1000 MCG tablet Take 1 tablet (1,000 mcg total) by mouth daily. 01/16/21   Fritzi Mandes, MD    Allergies Penicillins  Family History  Problem Relation Age of Onset   Diabetes Mother        2003   Lung cancer Father    Diabetes Sister    Hypertension Sister    Heart disease Sister    Cancer Sister    Bone cancer Brother     Social History Social History   Tobacco Use   Smoking status: Former    Types: Cigarettes   Smokeless tobacco: Former    Types: Nurse, children's Use: Never used  Substance Use Topics   Alcohol use: No   Drug use: No    Review  of Systems Constitutional: No fever/chills Eyes: No visual changes. ENT: No sore throat. Cardiovascular: Positive for chest pain. Respiratory: Positive for shortness of breath. Gastrointestinal: No abdominal pain.  Positive for one episode of vomiting. Genitourinary: Negative for dysuria. Musculoskeletal: Negative for back pain. Skin: Negative for rash. Neurological: Negative for headaches, focal weakness or numbness.  ____________________________________________   PHYSICAL EXAM:  VITAL SIGNS: ED Triage Vitals  Enc Vitals Group     BP 01/28/21 1314 (!) 144/80     Pulse Rate 01/28/21 1314 71     Resp 01/28/21 1314 18     Temp 01/28/21 1314 98 F (36.7 C)     Temp Source 01/28/21 1314 Oral     SpO2 01/28/21 1314 98 %     Weight 01/28/21 1315 250 lb (113.4 kg)     Height 01/28/21 1315 _0  (1.803 m)     Head Circumference --      Peak Flow --      Pain Score 01/28/21 1314 6   Constitutional: Alert and oriented.  Eyes: Conjunctivae are normal.  ENT      Head: Normocephalic and atraumatic.      Nose: No congestion/rhinnorhea.      Mouth/Throat: Mucous membranes are moist.      Neck: No stridor. Hematological/Lymphatic/Immunilogical: No cervical lymphadenopathy. Cardiovascular: Normal rate, regular rhythm.  No murmurs, rubs, or gallops.  Respiratory: Normal respiratory effort without tachypnea nor retractions. Breath sounds are clear and equal bilaterally. No wheezes/rales/rhonchi. Gastrointestinal: Soft and non tender. No rebound. No guarding.  Genitourinary: Deferred Musculoskeletal: Normal range of motion in all extremities. No lower extremity edema. Neurologic:  Normal speech and language. No gross focal neurologic deficits are appreciated.  Skin:  Skin is warm, dry and intact. No rash noted. Psychiatric: Mood and affect are normal. Speech and behavior are normal. Patient exhibits appropriate insight and judgment.  ____________________________________________     LABS (pertinent positives/negatives)  Lipase 32 Trop hs 9 BMP na 136, k 4.6, glu 170, cr 1.94 CBC wbc 5.3, hgb 10.5, plt 190 D-dimer 0.67 ____________________________________________   EKG  I, Nance Pear, attending physician, personally viewed and interpreted this EKG  EKG Time: 1311 Rate: 71 Rhythm: atrial flutter Axis: normal Intervals: qtc 421 QRS: narrow, q waves III, v1 ST changes: no st elevation Impression: abnormal ekg ____________________________________________    RADIOLOGY  CXR Cardiac enlargement and vascular congestion  CT angio PE No acute abnormality ____________________________________________   PROCEDURES  Procedures  ____________________________________________   INITIAL IMPRESSION / ASSESSMENT AND PLAN / ED COURSE  Pertinent labs & imaging results that were available during my care of the patient were reviewed by me and considered in my medical decision making (see chart for details).   The patient presented to the emergency department today because of concern for chest pain of unclear etiology. Work up was initiated which included an elevated d-dimer. Thus CT angio was obtained which did not show any acute abnormality. He stated to me he had one episode of bloody emesis earlier today, however no emesis here in the emergency department. Did spit up a small amount of blood in sputum one time. At this point think likely patient's symptoms are related to COPD exacerbation. Patient has had problems with COPD in the past and some hemoptysis in the past. Given current hemoptysis and being on eliquis will plan on admission.   ____________________________________________   FINAL CLINICAL IMPRESSION(S) / ED DIAGNOSES  Final diagnoses:  COPD exacerbation ([place])     Note: This dictation was prepared with Dragon dictation. Any transcriptional errors that result from this process are unintentional      Nance Pear, MD 01/28/21  2011

## 2021-01-28 NOTE — H&P (Signed)
History and Physical    Larry Callahan YQI:[id number] DOB: 1955-08-18 DOA: 01/28/2021  PCP: Lavera Guise, MD  Chief Complaint: Chest pain, shortness of breath  HPI: Larry Callahan is a 65 y.o. male with a past medical history of COPD not on home oxygen, chronic combined systolic and diastolic CHF, CKD, coronary artery disease, insulin-dependent diabetes mellitus type 2, obesity, hypertension, hyperlipidemia, diabetic neuropathy depression/anxiety, GERD former smoker, atrial fibrillation/flutter on Eliquis, former smoker.  Somewhat of a poor historian.  Reports chest tightness but unclear how long this has been going on.  Located in the right side and described as stabbing-like.  Some shortness of breath and some mild wheezing.  States he had 1 episode of vomiting with some blood in it this morning.  Does not appear to be an episode of frank hematemesis the way he is describing it.  When asked to further describe this episode of bloody emesis states that it was mostly food but with some blood in it.  He is on anticoagulation.  In the emergency department he has had 1 episode of hemoptysis.  No hematemesis in the ER.  No fevers or chills.  ED Course: Solu-Medrol 125 mg IV x1, DuoNebs 3 mL x 4, GI cocktail; EKG, CT PE, chest x-ray, BMP, troponins, D-dimer, type and screen, lipase, CBC.  Review of Systems: 14 point review of systems is negative except for what is mentioned above in the HPI.   Past Medical History:  Diagnosis Date   Allergy    Chronic combined systolic (congestive) and diastolic (congestive) heart failure ([place])    a. 04/2019 Echo: EF 45-50%; b. 02/2019 Echo: EF 55-60%; c. 09/2020 Echo: EF 35-40%, glob HK. Nl RV size/fxn. Mild MR.   CKD (chronic kidney disease), stage III (HCC)    COPD (chronic obstructive pulmonary disease) ([place])    Coronary artery disease    a. 2011 Cath: nonobs dzs; b. 09/2020 MV: EF 38%. No signif ischemia. ? inf MI vs diaph attenuation. GI  uptake artifact.   Diabetes mellitus without complication ([place])    GERD (gastroesophageal reflux disease)    Hyperlipidemia    Hypertension    Morbid obesity ([place])    NICM (nonischemic cardiomyopathy) ([place])    a. 04/2019 Echo: EF 45-50%; b. 02/2019 Echo: EF 55-60%; c. 09/2020 Echo: EF 35-40%, glob HK; d. 09/2020 MV: No ischemia. ? inf infarct vs attenuation.   OSA (obstructive sleep apnea)     Past Surgical History:  Procedure Laterality Date   BACK SURGERY     CARDIAC CATHETERIZATION     CHOLECYSTECTOMY     COLONOSCOPY WITH PROPOFOL N/A 04/12/2019   Procedure: COLONOSCOPY WITH PROPOFOL;  Surgeon: [name], MD;  Location: [facility] ENDOSCOPY;  Service: Gastroenterology;  Laterality: N/A;   ESOPHAGOGASTRODUODENOSCOPY N/A 04/12/2019   Procedure: ESOPHAGOGASTRODUODENOSCOPY (EGD);  Surgeon: [name], MD;  Location: [facility] ENDOSCOPY;  Service: Gastroenterology;  Laterality: N/A;   ESOPHAGOGASTRODUODENOSCOPY (EGD) WITH PROPOFOL N/A 11/21/2019   Procedure: ESOPHAGOGASTRODUODENOSCOPY (EGD) WITH PROPOFOL;  Surgeon: [name], MD;  Location: [facility] ENDOSCOPY;  Service: Gastroenterology;  Laterality: N/A;   FLEXIBLE BRONCHOSCOPY Bilateral 05/17/2019   Procedure: FLEXIBLE BRONCHOSCOPY;  Surgeon: [name], MD;  Location: [name] ORS;  Service: Pulmonary;  Laterality: Bilateral;   left arm surgery     nephrectomy Left    PARATHYROIDECTOMY     RIGHT HEART CATH N/A 03/29/2019   Procedure: RIGHT HEART CATH;  Surgeon: [name], MD;  Location: [facility] INVASIVE CV  LAB;  Service: Cardiovascular;  Laterality: N/A;    Social History   Socioeconomic History   Marital status: Single    Spouse name: Not on file   Number of children: Not on file   Years of education: Not on file   Highest education level: Not on file  Occupational History   Not on file  Tobacco Use   Smoking status: Former    Types: Cigarettes   Smokeless tobacco: Former    Types: Psychologist, forensic Use: Never used  Substance and Sexual Activity   Alcohol use: No   Drug use: No   Sexual activity: Not on file  Other Topics Concern   Not on file  Social History Narrative   Not on file   Social Determinants of Health   Financial Resource Strain: Not on file  Food Insecurity: Not on file  Transportation Needs: Not on file  Physical Activity: Not on file  Stress: Not on file  Social Connections: Not on file  Intimate Partner Violence: Not on file    Allergies  Allergen Reactions   Penicillins Rash    Family History  Problem Relation Age of Onset   Diabetes Mother        2003   Lung cancer Father    Diabetes Sister    Hypertension Sister    Heart disease Sister    Cancer Sister    Bone cancer Brother     Prior to Admission medications   Medication Sig Start Date End Date Taking? Authorizing Provider  albuterol (VENTOLIN HFA) 108 (90 Base) MCG/ACT inhaler Inhale 2 puffs into the lungs every 6 (six) hours as needed for wheezing or shortness of breath. 08/31/20   Lavera Guise, MD  apixaban (ELIQUIS) 2.5 MG TABS tablet Take 1 tablet (2.5 mg total) by mouth 2 (two) times daily. 01/07/21   Arta Silence, MD  Blood Glucose Calibration (TRUE METRIX LEVEL 1) Low SOLN Use as directed 04/04/18   [name], NP  Blood Glucose Monitoring Suppl (TRUE METRIX AIR GLUCOSE METER) DEVI 1 Device by Does not apply route 2 (two) times daily. 04/13/18   [name], NP  budesonide-formoterol (SYMBICORT) 160-4.5 MCG/ACT inhaler Inhale 2 puffs into the lungs 2 (two) times daily. 07/20/20   Lavera Guise, MD  carvedilol (COREG) 12.5 MG tablet Take 1 tablet (12.5 mg total) by mouth 2 (two) times daily. 01/07/21 01/07/22  Arta Silence, MD  cetirizine (ZYRTEC) 10 MG tablet Take 10 mg by mouth daily.    [provider]  escitalopram (LEXAPRO) 10 MG tablet Take one tab po qd for anxiety with food 07/17/20   Lavera Guise, MD  ferrous gluconate (FERGON) 324 MG tablet  Take 1 tablet (324 mg total) by mouth daily with breakfast. 08/01/19   [name], NP  furosemide (LASIX) 40 MG tablet Take one to tabs once or twice days for fluid // swelling 07/17/20   Lavera Guise, MD  gabapentin (NEURONTIN) 100 MG capsule Take 1 capsule (100 mg total) by mouth in the morning, at noon, and at bedtime. 07/17/20   Lavera Guise, MD  glimepiride Jari Sportsman) 2 MG tablet Take one tab po qd with supper 07/17/20   Lavera Guise, MD  glucose blood (TRUE METRIX BLOOD GLUCOSE TEST) test strip Use as instructed twice daily diag E11.65 07/30/19   [name], NP  hydrALAZINE (APRESOLINE) 100 MG tablet Take 1 tablet (100 mg total)  by mouth 3 (three) times daily. 07/17/20   Lavera Guise, MD  insulin glargine, 1 Unit Dial, (TOUJEO) 300 UNIT/ML Solostar Pen Inject 26 Units into the skin daily. 04/30/20   [name], NP  ipratropium-albuterol (DUONEB) 0.5-2.5 (3) MG/3ML SOLN Take 3 mLs by nebulization every 6 (six) hours as needed. 10/11/20   Lavera Guise, MD  isosorbide mononitrate (IMDUR) 30 MG 24 hr tablet Take 1 tablet (30 mg total) by mouth 2 (two) times daily. 10/08/20   Jennye Boroughs, MD  meclizine (ANTIVERT) 25 MG tablet Take 1 tablet (25 mg total) by mouth 3 (three) times daily as needed for dizziness or nausea. 11/25/20   Paulette Blanch, MD  NOVOFINE PEN NEEDLE 32G X 6 MM MISC Use as directed 10/15/20   Lavera Guise, MD  omeprazole (PRILOSEC) 40 MG capsule Take 1 capsule (40 mg total) by mouth in the morning and at bedtime. 01/26/21   Lavera Guise, MD  potassium chloride SA (KLOR-CON) 20 MEQ tablet Take 1 tablet (20 mEq total) by mouth 2 (two) times daily. 07/17/20   Lavera Guise, MD  rosuvastatin (CRESTOR) 10 MG tablet Take 10 mg by mouth at bedtime. 12/12/20   [provider]  tiotropium (SPIRIVA HANDIHALER) 18 MCG inhalation capsule Place 1 capsule (18 mcg total) into inhaler and inhale daily. 11/10/20 02/08/21  Lavera Guise, MD  TRUEplus Lancets 33G MISC Use as directed  twice daily diag E11.65 07/30/19   [name], NP  vitamin B-12 1000 MCG tablet Take 1 tablet (1,000 mcg total) by mouth daily. 01/16/21   Fritzi Mandes, MD    Physical Exam: Vitals:   01/28/21 1314 01/28/21 1315 01/28/21 1641 01/28/21 1944  BP: (!) 144/80  (!) 147/89 (!) 160/93  Pulse: 71  68 79  Resp: _0 Temp: 98 F (36.7 C)  97.8 F (36.6 C)   TempSrc: Oral  Oral   SpO2: 98%  94% 98%  Weight:  113.4 kg    Height:  _1  (1.803 m)       General:  Appears calm and comfortable and is in NAD Cardiovascular:  RRR, no m/r/g.  Respiratory:   Faint expiratory wheezing with mild cough. Abdomen:  soft, NT, ND, NABS Skin:  no rash or induration seen on limited exam Musculoskeletal:  grossly normal tone BUE/BLE, good ROM, no bony abnormality Lower extremity:  No LE edema.  Limited foot exam with no ulcerations.  2+ distal pulses. Psychiatric:  grossly normal mood and affect, speech fluent and appropriate, AOx3 Neurologic:  CN 2-12 grossly intact, moves all extremities in coordinated fashion, sensation intact    Radiological Exams on Admission: Independently reviewed - see discussion in A/P where applicable  DG Chest 2 View  Result Date: 01/28/2021 CLINICAL DATA:  Shortness of breath. EXAM: CHEST - 2 VIEW COMPARISON:  01/07/2021 FINDINGS: Rotational artifact. Mild cardiac enlargement. No pleural effusion. Diffuse pulmonary vascular congestion. No airspace opacities. IMPRESSION: Cardiac enlargement and pulmonary vascular congestion. Electronically Signed   By: Kerby Moors M.D.   On: 01/28/2021 14:16   CT Angio Chest PE W and/or Wo Contrast  Result Date: 01/28/2021 CLINICAL DATA:  right side rib pain. Pt denies any known injuries and it has been hurting since Monday. Pt's social workers is the one who asked him to come up here. PT denies any recent cough. Pt has recent dx of a-fib. EXAM: CT ANGIOGRAPHY CHEST WITH CONTRAST TECHNIQUE: Multidetector CT imaging of  the chest was  performed using the standard protocol during bolus administration of intravenous contrast. Multiplanar CT image reconstructions and MIPs were obtained to evaluate the vascular anatomy. CONTRAST:  74m OMNIPAQUE IOHEXOL 350 MG/ML SOLN COMPARISON:  CT chest 01/07/2021 FINDINGS: Cardiovascular: Satisfactory opacification of the pulmonary arteries to the segmental level. Limited evaluation of the subsegmental level due to motion artifact. No evidence of pulmonary embolism. The main pulmonary artery measures at the upper limits of normal. Enlarged heart. No significant pericardial effusion. The thoracic aorta is normal in caliber. Mild atherosclerotic plaque of the thoracic aorta. Left anterior descending coronary artery calcifications. Mediastinum/Nodes: No enlarged mediastinal, hilar, or axillary lymph nodes. Thyroid gland, trachea, and esophagus demonstrate no significant findings. Lungs/Pleura: Expiratory phase of respiration. No focal consolidation. No pulmonary nodule. No pulmonary mass. Interval decrease in now residual trace left pleural effusion. No right pleural effusion. No pneumothorax. Upper Abdomen: Partially visualized fluid density within the right kidney. There is a partially visualized known left adrenal gland fluid density lesion with an associated 2 mm calcification. Findings are better evaluated on CT abdomen 11/30/2020. Punctate splenic calcifications likely sequelae of prior granulomatous disease. Musculoskeletal: No chest wall abnormality. No suspicious lytic or blastic osseous lesions. No acute displaced fracture. Multilevel degenerative changes of the spine. Review of the MIP images confirms the above findings. IMPRESSION: 1. No central or segment pulmonary embolus. 2. Interval decrease in now residual trace left pleural effusion. 3.  Aortic Atherosclerosis (ICD10-I70.0). 4. Otherwise no acute intrathoracic abnormality. Electronically Signed   By: MIven FinnM.D.   On: 01/28/2021 18:56     EKG: Independently reviewed.  A flutter with 4-1 conduction, rate 71   Labs on Admission: I have personally reviewed the available labs and imaging studies at the time of the admission.  Pertinent labs: Hemoglobin 10.5, hematocrit 32.8, BUN 35, creatinine 1.94, blood glucose 170, D-dimer 0.67, troponin 9, repeat troponin 9.     Assessment/Plan: Pleuritic chest pain and hemoptysis secondary to mild COPD exacerbation: The patient will be admitted to the medical/surgical floor under observation status.  Continue breathing treatments, antibiotics and steroids.  Troponins are reassuring.  No STEMI on EKG.  CT PE ruled out pulmonary embolism.  His hemoptysis is likely due to his COPD.  Continue monitoring.  I will continue his anticoagulation as it appears his hemoptysis is mild.  Can discuss with pulmonology in the morning to see if we need to hold it for several days.?  Atrial flutter/fib: Continue home carvedilol 12.5 mg twice daily.  Continue patient's home Eliquis 2.5 mg twice daily and discussed with pulmonology if we need to hold for mild hemoptysis? (Day team can discuss with pulmonology) Currently continuing as benefits outweigh risks.  Currently if he develops another episode of bloody emesis can hold.  No episodes thus far of hematemesis in the emergency department.  Insulin-dependent diabetes mellitus type 2: Continue home Amaryl 2 mg daily and Toujeo 26 units daily.  The patient has been placed on a sliding scale with Accu-Cheks before meals and at bedtime.  Monitor blood sugars closely as the patient will be receiving IV steroids.  Hyperlipidemia: Continue home Crestor 10 mg daily.  Diabetic neuropathy: Continue home gabapentin 1 mg 3 times daily.  Depression/anxiety: Continue home Lexapro 10 mg daily.  GERD: Continue home Prilosec 20 mg daily.  Combined systolic and diastolic CHF: Appears euvolemic.  Not in acute decompensation.  Continue home Coreg 12.5 mg daily, Lasix 40 mg  daily to twice daily, and Imdur  30 mg twice daily.  Hypertension: Continue home hydralazine 100 mg 3 times daily.  Level of Care: MedSurg DVT prophylaxis: Eliquis Code Status: Full code Consults: None Admission status: Observation   Leslee Home DO [facility]   How to contact the [facility] Attending or Consulting provider [place] or covering provider during after hours [place], for this patient?  Check the care team in [facility] and look for a) attending/consulting TRH provider listed and b) the [facility] team listed Log into [email or web address] and use [place]'s universal password to access. If you do not have the password, please contact the hospital operator. Locate the [facility] provider you are looking for under [facility] and page to a number that you can be directly reached. If you still have difficulty reaching the provider, please page the [facility] (Director on Call) for the Hospitalists listed on amion for assistance.   01/28/2021, 8:20 PM

## 2021-01-28 NOTE — ED Notes (Signed)
Ambulatory pulse ox 93% on room air, pt ambulated with steady gait aprox 50 ft.

## 2021-01-28 NOTE — ED Triage Notes (Signed)
Please see previous RN triage note. Pt also reports emesis x 2 today, with dark red blood noted in emesis. C/o SOB and right sided rib/chest pain. NAD noted at this time.

## 2021-01-28 NOTE — ED Notes (Signed)
bg 306

## 2021-01-28 NOTE — ED Triage Notes (Signed)
Pt comes into the ED [facility] from home c/o right side rib pain.  Pt denies any known injuries and it has been hurting since Monday.  Pt's social workers is the one who asked him to come up here.  PT denies any recent cough.  Pt has recent dx of a-fib.    145/80 70HR CBg 208 97 % RA

## 2021-01-29 DIAGNOSIS — J441 Chronic obstructive pulmonary disease with (acute) exacerbation: Secondary | ICD-10-CM | POA: Diagnosis not present

## 2021-01-29 DIAGNOSIS — I483 Typical atrial flutter: Secondary | ICD-10-CM | POA: Diagnosis not present

## 2021-01-29 DIAGNOSIS — E1122 Type 2 diabetes mellitus with diabetic chronic kidney disease: Secondary | ICD-10-CM | POA: Diagnosis not present

## 2021-01-29 DIAGNOSIS — I5022 Chronic systolic (congestive) heart failure: Secondary | ICD-10-CM | POA: Diagnosis not present

## 2021-01-29 DIAGNOSIS — N183 Chronic kidney disease, stage 3 unspecified: Secondary | ICD-10-CM | POA: Diagnosis not present

## 2021-01-29 LAB — CBG MONITORING, ED
Glucose-Capillary: 251 mg/dL — ABNORMAL HIGH (ref 70–99)
Glucose-Capillary: 300 mg/dL — ABNORMAL HIGH (ref 70–99)

## 2021-01-29 LAB — BASIC METABOLIC PANEL
Anion gap: 6 (ref 5–15)
BUN: 38 mg/dL — ABNORMAL HIGH (ref 8–23)
CO2: 23 mmol/L (ref 22–32)
Calcium: 9 mg/dL (ref 8.9–10.3)
Chloride: 106 mmol/L (ref 98–111)
Creatinine, Ser: 2.07 mg/dL — ABNORMAL HIGH (ref 0.61–1.24)
GFR, Estimated: 34 mL/min — ABNORMAL LOW (ref >60)
Glucose, Bld: 287 mg/dL — ABNORMAL HIGH (ref 70–99)
Potassium: 5 mmol/L (ref 3.5–5.1)
Sodium: 135 mmol/L (ref 135–145)

## 2021-01-29 LAB — CBC
HCT: 32.7 % — ABNORMAL LOW (ref 39.0–52.0)
Hemoglobin: 10.5 g/dL — ABNORMAL LOW (ref 13.0–17.0)
MCH: 27.3 pg (ref 26.0–34.0)
MCHC: 32.1 g/dL (ref 30.0–36.0)
MCV: 84.9 fL (ref 80.0–100.0)
Platelets: 184 10*3/uL (ref 150–400)
RBC: 3.85 MIL/uL — ABNORMAL LOW (ref 4.22–5.81)
RDW: 15.1 % (ref 11.5–15.5)
WBC: 3.9 10*3/uL — ABNORMAL LOW (ref 4.0–10.5)
nRBC: 0 % (ref 0.0–0.2)

## 2021-01-29 LAB — BRAIN NATRIURETIC PEPTIDE: B Natriuretic Peptide: 83.5 pg/mL (ref 0.0–100.0)

## 2021-01-29 MED ORDER — PREDNISONE 20 MG PO TABS: 40.0000 mg | ORAL_TABLET | Freq: Every day | ORAL | 0 refills | Status: DC

## 2021-01-29 MED ORDER — DOXYCYCLINE HYCLATE 100 MG PO TABS: 100.0000 mg | ORAL_TABLET | Freq: Two times a day (BID) | ORAL | 0 refills | Status: DC

## 2021-01-29 NOTE — ED Notes (Signed)
SW at bedside.

## 2021-01-29 NOTE — TOC Initial Note (Addendum)
Transition of Care [facility]) - Initial/Assessment Note    Patient Details  Name: Larry Callahan MRN: [id number] Date of Birth: [birth year]  [facility]) CM/SW Contact:    Ova Freshwater Phone Number: [phone] 01/29/2021, 3:29 PM  Clinical Narrative:                  Patient will d/c home with PT and RN home heath, [facility] has confirmed the will accept patient. CSW will arrange transportation via [facility]  Patient has DME at home. CSW updated patient's sister Larry Callahan (Sister) [phone] [facility]) on discharge plans.  Expected Discharge Plan: Early Barriers to Discharge: No Barriers Identified   Patient Goals and CMS Choice   CMS Medicare.gov Compare Post Acute Care list provided to:: Patient Choice offered to / list presented to : Patient  Expected Discharge Plan and Services Expected Discharge Plan: [place] In-house Referral: Clinical Social Work   Post Acute Care Choice: [place] arrangements for the past 2 months: Mobile Home Expected Discharge Date: 01/29/21                         HH Arranged: PT, RN [place] Agency: [facility] Date [place]: 01/29/21 Time [place]: 1528 Representative spoke with at [place]: [place]  Prior Living Arrangements/Services Living arrangements for the past 2 months: [place] with:: Self Patient language and need for interpreter reviewed:: Yes Do you feel safe going back to the place where you live?: Yes      Need for Family Participation in Patient Care: Yes (Comment) Care giver support system in place?: Yes (comment)   Criminal Activity/Legal Involvement Pertinent to Current Situation/Hospitalization: No - Comment as needed  Activities of Daily Living      Permission Sought/Granted Permission sought to share information with : Family Supports Permission granted to share information with : Yes, Verbal  Permission Granted  Share Information with NAME: Hamilton,Shelby (Sister)   [phone] (Mobile)           Emotional Assessment Appearance:: Appears older than stated age Attitude/Demeanor/Rapport: Engaged Affect (typically observed): Stable Orientation: : Oriented to Self, Oriented to Place, Oriented to  Time, Oriented to Situation Alcohol / Substance Use: Not Applicable Psych Involvement: No (comment)  Admission diagnosis:  Hemoptysis [R04.2] Patient Active Problem List   Diagnosis Date Noted   COPD with acute exacerbation ([place]) 01/29/2021   Atrial flutter ([place]) 01/07/2021   Syncope and collapse 11/16/2020   Demand ischemia ([place])    Influenza A 10/30/2020   HLD (hyperlipidemia) 10/06/2020   History of CVA (cerebrovascular accident) 09/25/2020   Gastroesophageal reflux disease without esophagitis 04/19/2020   Pneumonia due to COVID-19 virus 01/30/2020   Elevated troponin I level 01/29/2020   Suspected COVID-19 virus infection 01/29/2020   Lightheadedness 01/29/2020   Hematemesis 01/09/2020   Hospital discharge follow-up 11/26/2019   Generalized weakness 08/08/2019   Obesity (BMI 30-39.9) 08/08/2019   Acute right hip pain 06/17/2019   Inability to ambulate due to hip 06/17/2019   CKD stage 3 due to type 2 diabetes mellitus (HCC)    Hypervolemia    Type 2 diabetes mellitus with stage 3b chronic kidney disease, with long-term current use of insulin ([place])    Hypomagnesemia    Full code status 05/20/2019   Pleuritic chest pain    Multifocal pneumonia 05/19/2019   Acute kidney injury superimposed on CKD 3b (  [place]) 05/09/2019   Hemoptysis 04/09/2019   Shortness of breath 03/26/2019   Uncontrolled type 2 diabetes mellitus with insulin therapy ([place]) 03/12/2019   Acute on chronic heart failure with preserved ejection fraction (HFpEF) (HCC)    Stage 3b chronic kidney disease ([place])    Chronic systolic CHF (congestive heart failure) ([place]) 27/12/8673   Diastolic dysfunction  44/92/0100   Iron deficiency anemia 12/31/2018   Atopic dermatitis 11/24/2018   Screening for colon cancer 11/06/2017   Uncontrolled type 2 diabetes mellitus with hyperglycemia ([place]) 11/06/2017   Hidradenitis 06/09/2017   Atherosclerotic heart disease of native coronary artery without angina pectoris 06/09/2017   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   Obstructive sleep apnea, adult 06/09/2017   Morbid (severe) obesity due to excess calories ([place]) 06/09/2017   Cigarette nicotine dependence with nicotine-induced disorder 06/09/2017   Diabetic polyneuropathy associated with type 2 diabetes mellitus ([place]) 06/09/2017   Allergic rhinitis due to pollen 06/09/2017   Mixed hyperlipidemia 06/09/2017   COPD (chronic obstructive pulmonary disease) ([place]) 06/09/2017   Dyspnea on exertion 06/09/2017   Wheezing 06/09/2017   Dysuria 06/09/2017   Snoring 06/09/2017   Essential hypertension 06/09/2017   Personal history of other malignant neoplasm of kidney 06/09/2017   Pain in right hip 06/09/2017   Impacted cerumen, bilateral 06/09/2017   Secondary hyperparathyroidism, not elsewhere classified ([place]) 06/09/2017   Type 2 diabetes mellitus with hyperglycemia ([place]) 06/09/2017   Tinea corporis 06/09/2017   PCP:  Lavera Guise, MD Pharmacy:   [place], [place] [address] [place] [address] Phone: [phone] Fax: [phone]     Social Determinants of Health (SDOH) Interventions    Readmission Risk Interventions Readmission Risk Prevention Plan 02/01/2020 01/31/2020 05/10/2019  Transportation Screening Complete Complete Complete  Medication Review Press photographer) Complete Complete Complete  PCP or Specialist appointment within 3-5 days of discharge Complete Complete -  HRI or Home Care Consult Complete Complete -  SW Recovery Care/Counseling Consult Complete Complete -  SW Consult Not Complete Comments - - -   Palliative Care Screening Not Applicable Not Applicable Not Applicable  Skilled Nursing Facility Not Applicable Not Applicable Not Applicable  Some recent data might be hidden

## 2021-01-29 NOTE — ED Notes (Signed)
Patient is resting comfortably. 

## 2021-01-29 NOTE — TOC Transition Note (Signed)
Transition of Care [facility]) - CM/SW Discharge Note   Patient Details  Name: Larry Callahan MRN: [id number] Date of Birth: 07-13-1955  [facility] of [facility]) CM/SW Contact:  Ova Freshwater Phone Number: [phone] 01/29/2021, 4:35 PM   Clinical Narrative:     Patient will transport with [facility] dispatch for door to door service.  ETA 5:00PM, they will call ED Secretary.  ED Staff updated.   Final next level of care: Home w Home Health Services Barriers to Discharge: No Barriers Identified   Patient Goals and CMS Choice   CMS Medicare.gov Compare Post Acute Care list provided to:: Patient Choice offered to / list presented to : Patient  Discharge Placement                       Discharge Plan and Services In-house Referral: Clinical Social Work   Post Acute Care Choice: Home Health                    HH Arranged: PT, RN [facility] Date [facility] Agency Contacted: 01/29/21 Time [place]: 1528 Representative spoke with at [place]: [place]  Social Determinants of Health ([place]) Interventions     Readmission Risk Interventions Readmission Risk Prevention Plan 02/01/2020 01/31/2020 05/10/2019  Transportation Screening Complete Complete Complete  Medication Review Press photographer) Complete Complete Complete  PCP or Specialist appointment within 3-5 days of discharge Complete Complete -  [place] or Home Care Consult Complete Complete -  SW Recovery Care/Counseling Consult Complete Complete -  SW Consult Not Complete Comments - - -  Palliative Care Screening Not Applicable Not Applicable Not [place] Not Applicable Not Applicable Not Applicable  Some recent data might be hidden

## 2021-01-29 NOTE — Discharge Summary (Signed)
Discharge Summary  Larry Callahan QAS:[id number] DOB: Mar 12, 1956  PCP: Lavera Guise, MD  Admit date: 01/28/2021 Discharge date: 01/29/2021  Time spent: 25 minutes  Recommendations for Outpatient Follow-up:  Patient will follow up with his pulmonologist in the next 1 week New medication: Prednisone 40 mg x5 days Patient will be set up with home health PT and RN  Discharge Diagnoses:  Active Hospital Problems   Diagnosis Date Noted   COPD with acute exacerbation ([place]) 01/29/2021   Atrial flutter ([place]) 01/07/2021   Generalized weakness 08/08/2019   Obesity (BMI 30-39.9) 08/08/2019   CKD stage 3 due to type 2 diabetes mellitus ([place])    Hemoptysis 79/89/2119   Chronic systolic CHF (congestive heart failure) ([place]) 03/01/2019   Essential hypertension 06/09/2017    Resolved Hospital Problems  No resolved problems to display.    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  Had another small episode of hemoptysis in the emergency room.  Noted to be dyspneic although oxygen saturations stayed above 90%.  Patient brought in for further observation under the hospitalist service.  Hospital Course:  Principal Problem:   COPD with acute exacerbation ([place]): Mild.  Patient given IV steroids and nebulizer treatments.  Even  with ambulation, patient oxygen saturations remained stable between 95-97% recommended he continue his nebulizers.  Have put him on 5 days of p.o. prednisone. Active Problems:   Essential hypertension: Blood pressure stable.    Chronic systolic CHF (congestive heart failure) ([place]): BNP stable, at 83.  Even with mild adjustments given obesity, not felt to be in acute heart failure.    Hemoptysis: Likely from forceful coughing.  No signs of blood clots or heavy bleeding    CKD stage 3 due to type 2 diabetes mellitus [facility]): Renal function remaining stable    Generalized weakness: Patient planed of continued weakness.  I have set up home health PT and RN.    Obesity (BMI 30-39.9): Meets criteria BMI greater than 30.    Atrial flutter ([place]): Stable during hospitalization.  Stated normal sinus rhythm.  He will continue on his Eliquis.  Procedures: None  Consultations: None  Discharge Exam: BP (!) 132/91 (BP Location: Right Arm)   Pulse (!) 107   Temp 97.6 F (36.4 C) (Oral)   Resp 15   Ht 5\' 11"  (1.803 m)   Wt 113.4 kg   SpO2 95%   BMI 34.87 kg/m   General: Alert and oriented x3, mildly anxious Cardiovascular: Regular rate and rhythm, S1-S2 Respiratory: Decreased breath sounds throughout, but good airway exchange  Discharge Instructions You were cared for by a hospitalist during your hospital stay. If you have any questions about your discharge medications or the care you received while you were in the hospital after you are discharged, you can call the unit and  asked to speak with the hospitalist on call if the hospitalist that took care of you is not available. Once you are discharged, your primary care physician will handle any further medical issues. Please note that NO REFILLS for any discharge medications will be authorized once you are discharged, as it is imperative that you return to your primary care physician (or establish a relationship with a primary care physician if  you do not have one) for your aftercare needs so that they can reassess your need for medications and monitor your lab values.  Discharge Instructions     Diet - low sodium heart healthy   Complete by: As directed    Increase activity slowly   Complete by: As directed       Allergies as of 01/29/2021       Reactions   Penicillins Rash        Medication List     TAKE these medications    albuterol 108 (90 Base) MCG/ACT inhaler Commonly known as: VENTOLIN HFA Inhale 2 puffs into the lungs every 6 (six) hours as needed for wheezing or shortness of breath.   apixaban 2.5 MG Tabs tablet Commonly known as: ELIQUIS Take 1 tablet (2.5 mg total) by mouth 2 (two) times daily.   budesonide-formoterol 160-4.5 MCG/ACT inhaler Commonly known as: SYMBICORT Inhale 2 puffs into the lungs 2 (two) times daily.   carvedilol 12.5 MG tablet Commonly known as: Coreg Take 1 tablet (12.5 mg total) by mouth 2 (two) times daily.   cetirizine 10 MG tablet Commonly known as: ZYRTEC Take 10 mg by mouth daily.   cyanocobalamin 1000 MCG tablet Take 1 tablet (1,000 mcg total) by mouth daily.   doxycycline 100 MG tablet Commonly known as: VIBRA-TABS Take 1 tablet (100 mg total) by mouth every 12 (twelve) hours.   escitalopram 10 MG tablet Commonly known as: Lexapro Take one tab po qd for anxiety with food   furosemide 40 MG tablet Commonly known as: LASIX Take one to tabs once or twice days for fluid // swelling   gabapentin 100 MG capsule Commonly known as: NEURONTIN Take 1 capsule (100 mg total) by mouth in the morning, at noon, and at bedtime.   glimepiride 2 MG tablet Commonly known as: AMARYL Take one tab po qd with supper   hydrALAZINE 100 MG tablet Commonly known as: APRESOLINE Take 1 tablet (100 mg total) by mouth 3 (three) times daily.   insulin glargine (1 Unit Dial) 300 UNIT/ML Solostar Pen Commonly known as: TOUJEO Inject 26 Units into the skin daily.    ipratropium-albuterol 0.5-2.5 (3) MG/3ML Soln Commonly known as: DUONEB Take 3 mLs by nebulization every 6 (six) hours as needed.   isosorbide mononitrate 30 MG 24 hr tablet Commonly known as: IMDUR Take 1 tablet (30 mg total) by mouth 2 (two) times daily.   meclizine 25 MG tablet Commonly known as: ANTIVERT Take 1 tablet (25 mg total) by mouth 3 (three) times daily as needed for dizziness or nausea.   Novofine Pen Needle 32G X 6 MM Misc Generic drug: Insulin Pen Needle Use as directed   omeprazole 40 MG capsule Commonly known as: PRILOSEC Take 1 capsule (40 mg total) by mouth in the morning and at bedtime.   potassium chloride SA 20 MEQ tablet Commonly known as: KLOR-CON Take 1 tablet (20 mEq total) by mouth 2 (two) times daily.   predniSONE 20 MG tablet Commonly known as: DELTASONE Take 2 tablets (40 mg total) by  mouth daily with breakfast. Start taking on: January 30, 2021   rosuvastatin 10 MG tablet Commonly known as: CRESTOR Take 10 mg by mouth at bedtime.   Spiriva HandiHaler 18 MCG inhalation capsule Generic drug: tiotropium Place 1 capsule (18 mcg total) into inhaler and inhale daily.   True Metrix Air Glucose Meter Devi 1 Device by Does not apply route 2 (two) times daily.   True Metrix Blood Glucose Test test strip Generic drug: glucose blood Use as instructed twice daily diag E11.65   True Metrix Level 1 Low Soln Use as directed   TRUEplus Lancets 33G Misc Use as directed twice daily diag E11.65       Allergies  Allergen Reactions   Penicillins Rash    Follow-up Information     [name], MD. Schedule an appointment as soon as possible for a visit in 5 day(s).   Specialties: Internal Medicine, Pulmonary Disease Contact information: [place] [address] [phone]                  The results of significant diagnostics from this hospitalization (including imaging, microbiology, ancillary and laboratory) are  listed below for reference.    Significant Diagnostic Studies: DG Chest 2 View  Result Date: 01/28/2021 CLINICAL DATA:  Shortness of breath. EXAM: CHEST - 2 VIEW COMPARISON:  01/07/2021 FINDINGS: Rotational artifact. Mild cardiac enlargement. No pleural effusion. Diffuse pulmonary vascular congestion. No airspace opacities. IMPRESSION: Cardiac enlargement and pulmonary vascular congestion. Electronically Signed   By: Kerby Moors M.D.   On: 01/28/2021 14:16   DG Chest 2 View  Result Date: 01/07/2021 CLINICAL DATA:  Chest pain EXAM: CHEST - 2 VIEW COMPARISON:  11/24/2020 FINDINGS: Heart is upper limits normal in size. Lungs clear. No effusions or edema. No acute bony abnormality. IMPRESSION: No active cardiopulmonary disease. Electronically Signed   By: Rolm Baptise M.D.   On: 01/07/2021 10:59   CT Angio Chest PE W and/or Wo Contrast  Result Date: 01/28/2021 CLINICAL DATA:  right side rib pain. Pt denies any known injuries and it has been hurting since Monday. Pt's social workers is the one who asked him to come up here. PT denies any recent cough. Pt has recent dx of a-fib. EXAM: CT ANGIOGRAPHY CHEST WITH CONTRAST TECHNIQUE: Multidetector CT imaging of the chest was performed using the standard protocol during bolus administration of intravenous contrast. Multiplanar CT image reconstructions and MIPs were obtained to evaluate the vascular anatomy. CONTRAST:  39mL OMNIPAQUE IOHEXOL 350 MG/ML SOLN COMPARISON:  CT chest 01/07/2021 FINDINGS: Cardiovascular: Satisfactory opacification of the pulmonary arteries to the segmental level. Limited evaluation of the subsegmental level due to motion artifact. No evidence of pulmonary embolism. The main pulmonary artery measures at the upper limits of normal. Enlarged heart. No significant pericardial effusion. The thoracic aorta is normal in caliber. Mild atherosclerotic plaque of the thoracic aorta. Left anterior descending coronary artery calcifications.  Mediastinum/Nodes: No enlarged mediastinal, hilar, or axillary lymph nodes. Thyroid gland, trachea, and esophagus demonstrate no significant findings. Lungs/Pleura: Expiratory phase of respiration. No focal consolidation. No pulmonary nodule. No pulmonary mass. Interval decrease in now residual trace left pleural effusion. No right pleural effusion. No pneumothorax. Upper Abdomen: Partially visualized fluid density within the right kidney. There is a partially visualized known left adrenal gland fluid density lesion with an associated 2 mm calcification. Findings are better evaluated on CT abdomen 11/30/2020. Punctate splenic calcifications likely sequelae of prior granulomatous disease. Musculoskeletal: No chest wall abnormality.  No suspicious lytic or blastic osseous lesions. No acute displaced fracture. Multilevel degenerative changes of the spine. Review of the MIP images confirms the above findings. IMPRESSION: 1. No central or segment pulmonary embolus. 2. Interval decrease in now residual trace left pleural effusion. 3.  Aortic Atherosclerosis (ICD10-I70.0). 4. Otherwise no acute intrathoracic abnormality. Electronically Signed   By: Iven Finn M.D.   On: 01/28/2021 18:56   CT Angio Chest PE W and/or Wo Contrast  Result Date: 01/07/2021 CLINICAL DATA:  PE suspected, high prob EXAM: CT ANGIOGRAPHY CHEST WITH CONTRAST TECHNIQUE: Multidetector CT imaging of the chest was performed using the standard protocol during bolus administration of intravenous contrast. Multiplanar CT image reconstructions and MIPs were obtained to evaluate the vascular anatomy. CONTRAST:  68mL OMNIPAQUE IOHEXOL 350 MG/ML SOLN COMPARISON:  Oct 30, 2020 FINDINGS: Cardiovascular: Evaluation is limited secondary to suboptimal contrast opacification, artifact and respiratory motion. No central pulmonary embolism. No definitive acute pulmonary embolism through the proximal segmental pulmonary arteries. There are predominately  LEFT-sided coronary artery atherosclerotic calcifications. No pericardial effusion. Heart is at the upper limits of normal in size. LEFT vertebral artery arises directly from the aorta. Scattered atherosclerotic calcifications throughout the aorta. Mediastinum/Nodes: No axillary or mediastinal adenopathy. Thyroid is unremarkable. Lungs/Pleura: Small LEFT pleural effusion. LEFT basilar ground-glass opacity. Evaluation of parenchymal detail is limited due to extensive respiratory motion. Upper Abdomen: Splenomegaly with multiple coarse calcifications likely reflecting sequela of prior granulomatous infection. Renal cysts. Musculoskeletal: Degenerative changes of the thoracic spine. Gynecomastia. Review of the MIP images confirms the above findings. IMPRESSION: 1. No central pulmonary embolism within the limitations of this exam. If persistent concern consider dedicated lower extremity ultrasound or V/Q scan. 2. Small LEFT pleural effusion. 3. LEFT basilar ground-glass opacity, nonspecific with differential considerations including infection, aspiration or atelectasis. Aortic Atherosclerosis (ICD10-I70.0). Electronically Signed   By: Valentino Saxon MD   On: 01/07/2021 15:17    Microbiology: Recent Results (from the past 240 hour(s))  Resp Panel by RT-PCR (Flu A&B, Covid) Nasopharyngeal Swab     Status: None   Collection Time: 01/28/21  7:54 PM   Specimen: Nasopharyngeal Swab; Nasopharyngeal(NP) swabs in vial transport medium  Result Value Ref Range Status   SARS Coronavirus 2 by RT PCR NEGATIVE NEGATIVE Final    Comment: (NOTE) SARS-CoV-2 target nucleic acids are NOT DETECTED.  The SARS-CoV-2 RNA is generally detectable in upper respiratory specimens during the acute phase of infection. The lowest concentration of SARS-CoV-2 viral copies this assay can detect is 138 copies/mL. A negative result does not preclude SARS-Cov-2 infection and should not be used as the sole basis for treatment or other  patient management decisions. A negative result may occur with  improper specimen collection/handling, submission of specimen other than nasopharyngeal swab, presence of viral mutation(s) within the areas targeted by this assay, and inadequate number of viral copies(<138 copies/mL). A negative result must be combined with clinical observations, patient history, and epidemiological information. The expected result is Negative.  Fact Sheet for Patients:  EntrepreneurPulse.com.au  Fact Sheet for Healthcare Providers:  IncredibleEmployment.be  This test is no t yet approved or cleared by the [place] FDA and  has been authorized for detection and/or diagnosis of SARS-CoV-2 by FDA under an Emergency Use Authorization (EUA). This EUA will remain  in effect (meaning this test can be used) for the duration of the COVID-19 declaration under Section 564(b)(1) of the Act, 21 U.S.C.section 360bbb-3(b)(1), unless the authorization is terminated  or revoked sooner.  Influenza A by PCR NEGATIVE NEGATIVE Final   Influenza B by PCR NEGATIVE NEGATIVE Final    Comment: (NOTE) The Xpert Xpress SARS-CoV-2/FLU/RSV plus assay is intended as an aid in the diagnosis of influenza from Nasopharyngeal swab specimens and should not be used as a sole basis for treatment. Nasal washings and aspirates are unacceptable for Xpert Xpress SARS-CoV-2/FLU/RSV testing.  Fact Sheet for Patients: EntrepreneurPulse.com.au  Fact Sheet for Healthcare Providers: IncredibleEmployment.be  This test is not yet approved or cleared by the [place] FDA and has been authorized for detection and/or diagnosis of SARS-CoV-2 by FDA under an Emergency Use Authorization (EUA). This EUA will remain in effect (meaning this test can be used) for the duration of the COVID-19 declaration under Section 564(b)(1) of the Act, 21 U.S.C. section  360bbb-3(b)(1), unless the authorization is terminated or revoked.  Performed at [facility], [address] [place], [place] [address]      Labs: Basic Metabolic Panel: Recent Labs  Lab 01/28/21 1319 01/29/21 0641  NA 136 135  K 4.6 5.0  CL 104 106  CO2 24 23  GLUCOSE 170* 287*  BUN 35* 38*  CREATININE 1.94* 2.07*  CALCIUM 8.7* 9.0   Liver Function Tests: No results for input(s): AST, ALT, ALKPHOS, BILITOT, PROT, ALBUMIN in the last 168 hours. Recent Labs  Lab 01/28/21 1319  LIPASE 32   No results for input(s): AMMONIA in the last 168 hours. CBC: Recent Labs  Lab 01/28/21 1319 01/29/21 0641  WBC 5.3 3.9*  HGB 10.5* 10.5*  HCT 32.8* 32.7*  MCV 85.4 84.9  PLT 190 184   Cardiac Enzymes: No results for input(s): CKTOTAL, CKMB, CKMBINDEX, TROPONINI in the last 168 hours. BNP: BNP (last 3 results) Recent Labs    10/06/20 1428 10/31/20 1900 01/29/21 0641  BNP 81.7 37.7 83.5    ProBNP (last 3 results) No results for input(s): PROBNP in the last 8760 hours.  CBG: Recent Labs  Lab 01/28/21 2253 01/29/21 0822 01/29/21 1151  GLUCAP 306* 251* 300*       Signed:  Annita Brod, MD [facility] 01/29/2021, 2:54 PM

## 2021-01-29 NOTE — ED Notes (Signed)
Pt NAD in bed, breathing even and unlabored/ a/ox4, speaking in full and complete sentences. Pt c/o COPD flare, came in for SOB and dizziness. Denies current SOB and dizziness. Denies CP. LS clear bilaterally. VSS.

## 2021-01-31 DIAGNOSIS — E1142 Type 2 diabetes mellitus with diabetic polyneuropathy: Secondary | ICD-10-CM | POA: Diagnosis not present

## 2021-01-31 DIAGNOSIS — I5042 Chronic combined systolic (congestive) and diastolic (congestive) heart failure: Secondary | ICD-10-CM | POA: Diagnosis not present

## 2021-01-31 DIAGNOSIS — I2510 Atherosclerotic heart disease of native coronary artery without angina pectoris: Secondary | ICD-10-CM | POA: Diagnosis not present

## 2021-01-31 DIAGNOSIS — J441 Chronic obstructive pulmonary disease with (acute) exacerbation: Secondary | ICD-10-CM | POA: Diagnosis not present

## 2021-01-31 DIAGNOSIS — E1122 Type 2 diabetes mellitus with diabetic chronic kidney disease: Secondary | ICD-10-CM | POA: Diagnosis not present

## 2021-01-31 DIAGNOSIS — N1832 Chronic kidney disease, stage 3b: Secondary | ICD-10-CM | POA: Diagnosis not present

## 2021-01-31 DIAGNOSIS — I4891 Unspecified atrial fibrillation: Secondary | ICD-10-CM | POA: Diagnosis not present

## 2021-01-31 DIAGNOSIS — I428 Other cardiomyopathies: Secondary | ICD-10-CM | POA: Diagnosis not present

## 2021-01-31 DIAGNOSIS — I110 Hypertensive heart disease with heart failure: Secondary | ICD-10-CM | POA: Diagnosis not present

## 2021-02-01 ENCOUNTER — Telehealth: Payer: Self-pay

## 2021-02-01 NOTE — Telephone Encounter (Signed)
Lmom to call us back nurse from [facility] [phone] for nursing order

## 2021-02-02 ENCOUNTER — Telehealth: Payer: Self-pay

## 2021-02-02 NOTE — Telephone Encounter (Signed)
Gave verbal to tanya from [facility] [phone] for nursing once a week for 9 week and 1 prn and also advised nurse that pt need ED follow up and bring all his medication on visit

## 2021-02-05 DIAGNOSIS — E1142 Type 2 diabetes mellitus with diabetic polyneuropathy: Secondary | ICD-10-CM | POA: Diagnosis not present

## 2021-02-05 DIAGNOSIS — I2510 Atherosclerotic heart disease of native coronary artery without angina pectoris: Secondary | ICD-10-CM | POA: Diagnosis not present

## 2021-02-05 DIAGNOSIS — E1122 Type 2 diabetes mellitus with diabetic chronic kidney disease: Secondary | ICD-10-CM | POA: Diagnosis not present

## 2021-02-05 DIAGNOSIS — I110 Hypertensive heart disease with heart failure: Secondary | ICD-10-CM | POA: Diagnosis not present

## 2021-02-05 DIAGNOSIS — I5042 Chronic combined systolic (congestive) and diastolic (congestive) heart failure: Secondary | ICD-10-CM | POA: Diagnosis not present

## 2021-02-05 DIAGNOSIS — J441 Chronic obstructive pulmonary disease with (acute) exacerbation: Secondary | ICD-10-CM | POA: Diagnosis not present

## 2021-02-05 DIAGNOSIS — N1832 Chronic kidney disease, stage 3b: Secondary | ICD-10-CM | POA: Diagnosis not present

## 2021-02-05 DIAGNOSIS — I4891 Unspecified atrial fibrillation: Secondary | ICD-10-CM | POA: Diagnosis not present

## 2021-02-05 DIAGNOSIS — I428 Other cardiomyopathies: Secondary | ICD-10-CM | POA: Diagnosis not present

## 2021-02-08 NOTE — Progress Notes (Deleted)
Cardiology Office Note  Date:  02/08/2021   ID:  Sire, Poet [birth year], MRN [id number]  PCP:  Lavera Guise, MD   No chief complaint on file.   HPI:  Larry Callahan is a 65 y.o. male with a hx of  morbid obesity,  hypertension poorly controlled,  type 2 diabetes  smoker,  chronic renal insufficiency,  single kidney,  obstructive sleep apnea on CPAP,    presenting to the emergency room September 2020 with shortness of breath on exertion Presents for routine follow-up of his new shortness of breath, diastolic dysfunction  Frequent trips to the emergency room for various issues  Seen in the ER [facility] 08/12/2018: leg pain peripheral neuropathy. Started on gabapentin  Recently in the hospital with discharge August 09, 2019 Was admitted for left arm pain, possible muscle spasm  Prior to that was in the emergency room 4 days earlier for bilateral leg pain, peripheral neuropathy.  Was in hospital January 2021 with acute right hip pain  In the hospital December 2020 with COPD exacerbation, pneumonia Several days prior was in the hospital for recurrent hemoptysis, work-up unrevealing  Thinks breathing improving Weight trending down Sometimes with a cane Leg pain better on gabapentin  Lab work reviewed CR >2 HBA1C 6.8  Quit smoking summer 2020  EKG personally reviewed by myself on todays visit Shows normal sinus rhythm with rate 76 bpm no significant ST or T wave changes   Details of recent hospitalization reviewed with him several recent trips to the emergency room for similar symptoms of shortness of breath on exertion   Recent work-up February 20, 2019 for shortness of breath    seen December 29, 2018 for similar symptoms of shortness of breath on exertion, at that time hemoglobin 9.6, creatinine 1.63   Blood pressure typically running high Presented to the hospital with midsternal nonradiating chest pain And hypertension   echocardiogram July  2020 Report not in the computer Per primary care office notes had normal LV function   left heart catheterization 2011 Nonobstructive disease at that time Nondominant RCA  Diagnosed with hypertensive heart disease with diastolic CHF, underlying COPD given long history of smoking  morbid obesity, BNP 322 Treated with IV Lasix   CT scan chest with no PE   significant coronary calcification through the LAD Unable to exclude underlying ischemia, unable several risk factors including poorly controlled diabetes --Stress test performed showing no ischemia   PMH:   has a past medical history of Allergy, Chronic combined systolic (congestive) and diastolic (congestive) heart failure ([place]), CKD (chronic kidney disease), stage III ([place]), COPD (chronic obstructive pulmonary disease) ([place]), Coronary artery disease, Diabetes mellitus without complication ([place]), GERD (gastroesophageal reflux disease), Hyperlipidemia, Hypertension, Morbid obesity ([place]), NICM (nonischemic cardiomyopathy) ([place]), and OSA (obstructive sleep apnea).  PSH:    Past Surgical History:  Procedure Laterality Date   BACK SURGERY     CARDIAC CATHETERIZATION     CHOLECYSTECTOMY     COLONOSCOPY WITH PROPOFOL N/A 04/12/2019   Procedure: COLONOSCOPY WITH PROPOFOL;  Surgeon: [name], MD;  Location: [facility] ENDOSCOPY;  Service: Gastroenterology;  Laterality: N/A;   ESOPHAGOGASTRODUODENOSCOPY N/A 04/12/2019   Procedure: ESOPHAGOGASTRODUODENOSCOPY (EGD);  Surgeon: [name], MD;  Location: [facility] ENDOSCOPY;  Service: Gastroenterology;  Laterality: N/A;   ESOPHAGOGASTRODUODENOSCOPY (EGD) WITH PROPOFOL N/A 11/21/2019   Procedure: ESOPHAGOGASTRODUODENOSCOPY (EGD) WITH PROPOFOL;  Surgeon: [name], MD;  Location: [facility] ENDOSCOPY;  Service: Gastroenterology;  Laterality: N/A;   FLEXIBLE BRONCHOSCOPY Bilateral  05/17/2019   Procedure: FLEXIBLE BRONCHOSCOPY;  Surgeon: [name], MD;  Location: [name] ORS;  Service:  Pulmonary;  Laterality: Bilateral;   left arm surgery     nephrectomy Left    PARATHYROIDECTOMY     RIGHT HEART CATH N/A 03/29/2019   Procedure: RIGHT HEART CATH;  Surgeon: [name], MD;  Location: [place] CV LAB;  Service: Cardiovascular;  Laterality: N/A;    Current Outpatient Medications  Medication Sig Dispense Refill   albuterol (VENTOLIN HFA) 108 (90 Base) MCG/ACT inhaler Inhale 2 puffs into the lungs every 6 (six) hours as needed for wheezing or shortness of breath. 18 g 0   apixaban (ELIQUIS) 2.5 MG TABS tablet Take 1 tablet (2.5 mg total) by mouth 2 (two) times daily. 60 tablet 1   Blood Glucose Calibration (TRUE METRIX LEVEL 1) Low SOLN Use as directed 1 each 1   Blood Glucose Monitoring Suppl (TRUE METRIX AIR GLUCOSE METER) DEVI 1 Device by Does not apply route 2 (two) times daily. 1 Device 0   budesonide-formoterol (SYMBICORT) 160-4.5 MCG/ACT inhaler Inhale 2 puffs into the lungs 2 (two) times daily. 1 each 3   carvedilol (COREG) 12.5 MG tablet Take 1 tablet (12.5 mg total) by mouth 2 (two) times daily. 60 tablet 11   cetirizine (ZYRTEC) 10 MG tablet Take 10 mg by mouth daily.     doxycycline (VIBRA-TABS) 100 MG tablet Take 1 tablet (100 mg total) by mouth every 12 (twelve) hours. 8 tablet 0   escitalopram (LEXAPRO) 10 MG tablet Take one tab po qd for anxiety with food 90 tablet 3   furosemide (LASIX) 40 MG tablet Take one to tabs once or twice days for fluid // swelling 180 tablet 1   gabapentin (NEURONTIN) 100 MG capsule Take 1 capsule (100 mg total) by mouth in the morning, at noon, and at bedtime. 270 capsule 1   glimepiride (AMARYL) 2 MG tablet Take one tab po qd with supper 90 tablet 1   glucose blood (TRUE METRIX BLOOD GLUCOSE TEST) test strip Use as instructed twice daily diag E11.65 100 each 3   hydrALAZINE (APRESOLINE) 100 MG tablet Take 1 tablet (100 mg total) by mouth 3 (three) times daily. 270 tablet 1   insulin glargine, 1 Unit Dial, (TOUJEO) 300  UNIT/ML Solostar Pen Inject 26 Units into the skin daily. 6 mL 3   ipratropium-albuterol (DUONEB) 0.5-2.5 (3) MG/3ML SOLN Take 3 mLs by nebulization every 6 (six) hours as needed. 1080 mL 3   isosorbide mononitrate (IMDUR) 30 MG 24 hr tablet Take 1 tablet (30 mg total) by mouth 2 (two) times daily. 60 tablet 0   meclizine (ANTIVERT) 25 MG tablet Take 1 tablet (25 mg total) by mouth 3 (three) times daily as needed for dizziness or nausea. 20 tablet 0   NOVOFINE PEN NEEDLE 32G X 6 MM MISC Use as directed 100 each 0   omeprazole (PRILOSEC) 40 MG capsule Take 1 capsule (40 mg total) by mouth in the morning and at bedtime. 180 capsule 0   potassium chloride SA (KLOR-CON) 20 MEQ tablet Take 1 tablet (20 mEq total) by mouth 2 (two) times daily. 180 tablet 1   predniSONE (DELTASONE) 20 MG tablet Take 2 tablets (40 mg total) by mouth daily with breakfast. 4 tablet 0   rosuvastatin (CRESTOR) 10 MG tablet Take 10 mg by mouth at bedtime.     tiotropium (SPIRIVA HANDIHALER) 18 MCG inhalation capsule Place 1 capsule (18 mcg total) into  inhaler and inhale daily. 90 capsule 1   TRUEplus Lancets 33G MISC Use as directed twice daily diag E11.65 100 each 3   vitamin B-12 1000 MCG tablet Take 1 tablet (1,000 mcg total) by mouth daily. 30 tablet 3   No current facility-administered medications for this visit.     Allergies:   Penicillins   Social History:  The patient  reports that he has quit smoking. His smoking use included cigarettes. He has quit using smokeless tobacco.  His smokeless tobacco use included chew. He reports that he does not drink alcohol and does not use drugs.   Family History:   family history includes Bone cancer in his brother; Cancer in his sister; Diabetes in his mother and sister; Heart disease in his sister; Hypertension in his sister; Lung cancer in his father.    Review of Systems: Review of Systems  Constitutional: Negative.   HENT: Negative.    Respiratory: Negative.     Cardiovascular: Negative.   Gastrointestinal: Negative.   Musculoskeletal: Negative.   Neurological: Negative.   Psychiatric/Behavioral: Negative.    All other systems reviewed and are negative.   PHYSICAL EXAM: VS:  There were no vitals taken for this visit. , BMI There is no height or weight on file to calculate BMI. Constitutional:  oriented to person, place, and time. No distress.  HENT:  Head: Grossly normal Eyes:  no discharge. No scleral icterus.  Neck: No JVD, no carotid bruits  Cardiovascular: Regular rate and rhythm, no murmurs appreciated Pulmonary/Chest: Clear to auscultation bilaterally, no wheezes or rails Abdominal: Soft.  no distension.  no tenderness.  Musculoskeletal: Normal range of motion Neurological:  normal muscle tone. Coordination normal. No atrophy Skin: Skin warm and dry Psychiatric: normal affect, pleasant   Recent Labs: 10/06/2020: TSH 1.561 11/16/2020: Magnesium 1.7 11/24/2020: ALT 22 01/29/2021: B Natriuretic Peptide 83.5; BUN 38; Creatinine, Ser 2.07; Hemoglobin 10.5; Platelets 184; Potassium 5.0; Sodium 135    Lipid Panel Lab Results  Component Value Date   CHOL 131 10/07/2020   HDL 40 (L) 10/07/2020   LDLCALC 63 10/07/2020   TRIG 142 10/07/2020      Wt Readings from Last 3 Encounters:  01/28/21 250 lb (113.4 kg)  01/08/21 263 lb 14.3 oz (119.7 kg)  11/30/20 276 lb 6.4 oz (125.4 kg)       ASSESSMENT AND PLAN:  Problem List Items Addressed This Visit   None Diastolic CHF, hypertensive heart disease Recommend he continue his current medication regiment Potassium stable Lab work from [facility] reviewed He does have underlying COPD, very deconditioned, recommended weight loss  Essential hypertension Blood pressure stable at home, elevated on today's visit Recommended continued weight loss  CAD Stressed importance of taking his cholesterol medication, diabetes control Currently with no symptoms of angina. No further workup at this  time. Continue current medication regimen.  Type 2 diabetes Managed by primary care  Disposition:   F/U  12 months  Extensive records reviewed, numerous trips to the emergency room from a recent trip to [facility] Musculoskeletal symptoms, neuropathy in his legs  Total encounter time more than 45 minutes  Greater than 50% was spent in counseling and coordination of care with the patient    Signed, Esmond Plants, M.D., Ph.D. [place], [place]

## 2021-02-09 ENCOUNTER — Ambulatory Visit: Payer: Medicare PPO | Admitting: Cardiovascular Disease

## 2021-02-09 DIAGNOSIS — I5020 Unspecified systolic (congestive) heart failure: Secondary | ICD-10-CM

## 2021-02-09 DIAGNOSIS — I10 Essential (primary) hypertension: Secondary | ICD-10-CM

## 2021-02-09 DIAGNOSIS — N183 Chronic kidney disease, stage 3 unspecified: Secondary | ICD-10-CM

## 2021-02-10 ENCOUNTER — Encounter: Payer: Self-pay | Admitting: Cardiovascular Disease

## 2021-02-10 ENCOUNTER — Telehealth: Payer: Self-pay | Admitting: Internal Medicine

## 2021-02-10 NOTE — Chronic Care Management (AMB) (Signed)
  Chronic Care Management   Outreach Note  02/10/2021 Name: Larry Callahan MRN: [id number] DOB: 07-19-1955  Referred by: Lavera Guise, MD Reason for referral : No chief complaint on file.   An unsuccessful telephone outreach was attempted today. The patient was referred to the pharmacist for assistance with care management and care coordination.   Follow Up Plan: pt will have $15 copay  [place]

## 2021-02-11 ENCOUNTER — Telehealth: Payer: Self-pay

## 2021-02-11 NOTE — Telephone Encounter (Signed)
Pt called and was saying he was in hospital 3 weeks ago and was suppose to follow up with [name] but has appt now on 02/22/21.  Pt then started saying he was having pain above his breast and it goes to left side, it comes and goes, stings, burns, itches, and then said he has trouble getting his breath and it feels like it did when he had pneumonia.  I told pt that he would need to be seen in our office so we can determine what is wrong and he advised he cant come in bc he has no ride, so I advised pt that he would need to call 911.

## 2021-02-12 ENCOUNTER — Telehealth: Payer: Self-pay | Admitting: Internal Medicine

## 2021-02-12 NOTE — Progress Notes (Signed)
  Chronic Care Management   Outreach Note  02/12/2021 Name: Erin Uecker MRN: [id number] DOB: [birth year]  Referred by: Lavera Guise, MD Reason for referral : No chief complaint on file.   A second unsuccessful telephone outreach was attempted today. The patient was referred to pharmacist for assistance with care management and care coordination.  Follow Up Plan:  pt will have $15 copay  [place]

## 2021-02-17 ENCOUNTER — Ambulatory Visit: Payer: Medicare PPO | Admitting: Dermatology

## 2021-02-18 ENCOUNTER — Telehealth: Payer: Self-pay

## 2021-02-18 ENCOUNTER — Emergency Department: Payer: Medicare PPO

## 2021-02-18 ENCOUNTER — Emergency Department
Admission: EM | Admit: 2021-02-18 | Discharge: 2021-02-18 | Disposition: A | Discharge status: Home / Self Care | Payer: Medicare PPO | Attending: Student in an Organized Health Care Education/Training Program | Admitting: Student in an Organized Health Care Education/Training Program

## 2021-02-18 ENCOUNTER — Other Ambulatory Visit: Payer: Self-pay

## 2021-02-18 ENCOUNTER — Telehealth: Payer: Self-pay | Admitting: Internal Medicine

## 2021-02-18 DIAGNOSIS — R200 Anesthesia of skin: Secondary | ICD-10-CM | POA: Diagnosis not present

## 2021-02-18 DIAGNOSIS — I110 Hypertensive heart disease with heart failure: Secondary | ICD-10-CM | POA: Diagnosis not present

## 2021-02-18 DIAGNOSIS — Z20822 Contact with and (suspected) exposure to covid-19: Secondary | ICD-10-CM | POA: Diagnosis not present

## 2021-02-18 DIAGNOSIS — Z794 Long term (current) use of insulin: Secondary | ICD-10-CM | POA: Insufficient documentation

## 2021-02-18 DIAGNOSIS — R079 Chest pain, unspecified: Secondary | ICD-10-CM | POA: Diagnosis not present

## 2021-02-18 DIAGNOSIS — I5022 Chronic systolic (congestive) heart failure: Secondary | ICD-10-CM | POA: Diagnosis not present

## 2021-02-18 DIAGNOSIS — I2510 Atherosclerotic heart disease of native coronary artery without angina pectoris: Secondary | ICD-10-CM | POA: Insufficient documentation

## 2021-02-18 DIAGNOSIS — J441 Chronic obstructive pulmonary disease with (acute) exacerbation: Secondary | ICD-10-CM | POA: Diagnosis not present

## 2021-02-18 DIAGNOSIS — R0789 Other chest pain: Secondary | ICD-10-CM | POA: Diagnosis not present

## 2021-02-18 DIAGNOSIS — N1832 Chronic kidney disease, stage 3b: Secondary | ICD-10-CM | POA: Diagnosis not present

## 2021-02-18 DIAGNOSIS — R29818 Other symptoms and signs involving the nervous system: Secondary | ICD-10-CM | POA: Diagnosis not present

## 2021-02-18 DIAGNOSIS — I10 Essential (primary) hypertension: Secondary | ICD-10-CM | POA: Diagnosis not present

## 2021-02-18 DIAGNOSIS — I428 Other cardiomyopathies: Secondary | ICD-10-CM | POA: Diagnosis not present

## 2021-02-18 DIAGNOSIS — Z7901 Long term (current) use of anticoagulants: Secondary | ICD-10-CM | POA: Diagnosis not present

## 2021-02-18 DIAGNOSIS — I611 Nontraumatic intracerebral hemorrhage in hemisphere, cortical: Secondary | ICD-10-CM | POA: Diagnosis not present

## 2021-02-18 DIAGNOSIS — R069 Unspecified abnormalities of breathing: Secondary | ICD-10-CM | POA: Diagnosis not present

## 2021-02-18 DIAGNOSIS — R202 Paresthesia of skin: Secondary | ICD-10-CM | POA: Diagnosis not present

## 2021-02-18 DIAGNOSIS — R0602 Shortness of breath: Secondary | ICD-10-CM | POA: Diagnosis not present

## 2021-02-18 DIAGNOSIS — I6782 Cerebral ischemia: Secondary | ICD-10-CM | POA: Diagnosis not present

## 2021-02-18 DIAGNOSIS — Z79899 Other long term (current) drug therapy: Secondary | ICD-10-CM | POA: Insufficient documentation

## 2021-02-18 DIAGNOSIS — I130 Hypertensive heart and chronic kidney disease with heart failure and stage 1 through stage 4 chronic kidney disease, or unspecified chronic kidney disease: Secondary | ICD-10-CM | POA: Insufficient documentation

## 2021-02-18 DIAGNOSIS — E1122 Type 2 diabetes mellitus with diabetic chronic kidney disease: Secondary | ICD-10-CM | POA: Insufficient documentation

## 2021-02-18 DIAGNOSIS — E1142 Type 2 diabetes mellitus with diabetic polyneuropathy: Secondary | ICD-10-CM | POA: Diagnosis not present

## 2021-02-18 DIAGNOSIS — Z8616 Personal history of COVID-19: Secondary | ICD-10-CM | POA: Insufficient documentation

## 2021-02-18 DIAGNOSIS — Z7984 Long term (current) use of oral hypoglycemic drugs: Secondary | ICD-10-CM | POA: Insufficient documentation

## 2021-02-18 DIAGNOSIS — Z87891 Personal history of nicotine dependence: Secondary | ICD-10-CM | POA: Diagnosis not present

## 2021-02-18 DIAGNOSIS — J449 Chronic obstructive pulmonary disease, unspecified: Secondary | ICD-10-CM | POA: Insufficient documentation

## 2021-02-18 DIAGNOSIS — I4891 Unspecified atrial fibrillation: Secondary | ICD-10-CM | POA: Diagnosis not present

## 2021-02-18 DIAGNOSIS — I5042 Chronic combined systolic (congestive) and diastolic (congestive) heart failure: Secondary | ICD-10-CM | POA: Diagnosis not present

## 2021-02-18 DIAGNOSIS — J811 Chronic pulmonary edema: Secondary | ICD-10-CM | POA: Diagnosis not present

## 2021-02-18 LAB — BASIC METABOLIC PANEL
Anion gap: 7 (ref 5–15)
BUN: 44 mg/dL — ABNORMAL HIGH (ref 8–23)
CO2: 23 mmol/L (ref 22–32)
Calcium: 8.5 mg/dL — ABNORMAL LOW (ref 8.9–10.3)
Chloride: 109 mmol/L (ref 98–111)
Creatinine, Ser: 2.24 mg/dL — ABNORMAL HIGH (ref 0.61–1.24)
GFR, Estimated: 31 mL/min — ABNORMAL LOW (ref >60)
Glucose, Bld: 194 mg/dL — ABNORMAL HIGH (ref 70–99)
Potassium: 4.2 mmol/L (ref 3.5–5.1)
Sodium: 139 mmol/L (ref 135–145)

## 2021-02-18 LAB — TROPONIN I (HIGH SENSITIVITY)
Troponin I (High Sensitivity): 15 ng/L (ref <18)
Troponin I (High Sensitivity): 14 ng/L (ref <18)

## 2021-02-18 LAB — CBC
HCT: 33 % — ABNORMAL LOW (ref 39.0–52.0)
Hemoglobin: 10.6 g/dL — ABNORMAL LOW (ref 13.0–17.0)
MCH: 26.7 pg (ref 26.0–34.0)
MCHC: 32.1 g/dL (ref 30.0–36.0)
MCV: 83.1 fL (ref 80.0–100.0)
Platelets: 172 10*3/uL (ref 150–400)
RBC: 3.97 MIL/uL — ABNORMAL LOW (ref 4.22–5.81)
RDW: 15.1 % (ref 11.5–15.5)
WBC: 5.5 10*3/uL (ref 4.0–10.5)
nRBC: 0 % (ref 0.0–0.2)

## 2021-02-18 LAB — HEPATIC FUNCTION PANEL
ALT: 20 U/L (ref 0–44)
AST: 24 U/L (ref 15–41)
Albumin: 3.1 g/dL — ABNORMAL LOW (ref 3.5–5.0)
Alkaline Phosphatase: 69 U/L (ref 38–126)
Bilirubin, Direct: 0.1 mg/dL (ref 0.0–0.2)
Indirect Bilirubin: 0.4 mg/dL (ref 0.3–0.9)
Total Bilirubin: 0.5 mg/dL (ref 0.3–1.2)
Total Protein: 6.6 g/dL (ref 6.5–8.1)

## 2021-02-18 LAB — RESP PANEL BY RT-PCR (FLU A&B, COVID) ARPGX2
Influenza A by PCR: NEGATIVE
Influenza B by PCR: NEGATIVE
SARS Coronavirus 2 by RT PCR: NEGATIVE

## 2021-02-18 LAB — BRAIN NATRIURETIC PEPTIDE: B Natriuretic Peptide: 52.7 pg/mL (ref 0.0–100.0)

## 2021-02-18 MED ORDER — IPRATROPIUM-ALBUTEROL 0.5-2.5 (3) MG/3ML IN SOLN
3.0000 mL | Freq: Once | RESPIRATORY_TRACT | Status: AC
  Administered 2021-02-18: 3 mL via RESPIRATORY_TRACT
  Filled 2021-02-18: qty 3

## 2021-02-18 MED ORDER — LORAZEPAM 0.5 MG PO TABS
0.5000 mg | ORAL_TABLET | Freq: Once | ORAL | Status: AC
  Administered 2021-02-18: 0.5 mg via ORAL
  Filled 2021-02-18: qty 1

## 2021-02-18 MED ORDER — ASPIRIN 81 MG PO CHEW
324.0000 mg | CHEWABLE_TABLET | Freq: Once | ORAL | Status: AC
  Administered 2021-02-18: 324 mg via ORAL
  Filled 2021-02-18: qty 4

## 2021-02-18 NOTE — ED Notes (Signed)
Patient transported to MRI 

## 2021-02-18 NOTE — Progress Notes (Signed)
  Chronic Care Management   Outreach Note  02/18/2021 Name: Larry Callahan MRN: [id number] DOB: March 10, 1956  Referred by: Lavera Guise, MD Reason for referral : No chief complaint on file.   Third unsuccessful telephone outreach was attempted today. The patient was referred to the pharmacist for assistance with care management and care coordination.   Follow Up Plan:  Tatjana Dellinger Upstream Scheduler

## 2021-02-18 NOTE — ED Provider Notes (Signed)
  Physical Exam  BP (!) 179/136 (BP Location: Right Arm)   Pulse 69   Temp 98.7 F (37.1 C) (Oral)   Resp 17   Ht 5\' 11"  (1.803 m)   SpO2 96%   BMI 34.87 kg/m   Physical Exam  ED Course/Procedures   Clinical Course as of 02/18/21 2322  Thu Feb 18, 2021  1753 [facility]: 26.7 [PR]  1934 Discussed numbness and tingling symptoms with neurology.  Patient already optimized on Eliquis as well as statin.  CT head unremarkable.  Just recently had echo as well as MRA.  His symptoms have not persisted will order MRI without contrast if negative would be appropriate for outpatient follow-up [PR]    Clinical Course User Index [PR] Merlyn Lot, MD    Procedures  MDM   Assumed patient care from Merlyn Lot, MD. MRI brain showed no acute intracranial abnormality.  Upon recheck, patient was resting comfortably.  He endorsed new right leg pain that started after patient went to the bathroom.  When questioning  about chest pain, he stated that discomfort had improved.  Delta troponin within reference range.  Return precautions were given to return with new or worsening symptoms.       Vallarie Mare Rinard, PA-C 02/18/21 Merceda Elks, MD 02/19/21 [phone]

## 2021-02-18 NOTE — Telephone Encounter (Signed)
Patient called stating someone just called him. I explained to him it was from [facility] and he needs to return their call-[name]

## 2021-02-18 NOTE — ED Triage Notes (Addendum)
Pt to ER via [facility]. Pt reports right sided rib/ chest pain, non-radiating that started x3 days ago, states it has been worsening. Pt also reports being unable to catch his breath, states his at home breathing treatments have not been helping, was advised to come to the ER by his home health nurse. Also reports feeling "hoarse" like there is "something in my throat". Pt also reports experiencing numbness in both legs, right side of jaw, both arms after he woke up from his nap at 1520 today.   No facial droop noted. Grip strength equal bilaterally. Pt alert and oriented. NAD at this time.   Pt discussed with Md Bradler.

## 2021-02-18 NOTE — ED Provider Notes (Signed)
[facility] Emergency Department Provider Note    Event Date/Time   First MD Initiated Contact with Patient 02/18/21 1741     (approximate)  I have reviewed the triage vital signs and the nursing notes.   HISTORY  Chief Complaint Chest Pain and Shortness of Breath    HPI Larry Callahan is a 65 y.o. male below listed past medical history presents to the ER for evaluation of shortness of breath as well as tingling sedation particular on the right side of his face and right side of his body.  Lasted several minutes today.  Following there is dripping sensation also feel like someone stuck in his throat.  Has since resolved.  Still feels slight tingling the right side of face.  Denies any history of stroke does have history of heart as well as lung disease.  States he was feeling short of breath and likely COPD was acting up today after he was out weed eating in the yard.  Denies any nausea or vomiting.  No blurred vision.  Past Medical History:  Diagnosis Date   Allergy    Chronic combined systolic (congestive) and diastolic (congestive) heart failure ([place])    a. 04/2019 Echo: EF 45-50%; b. 02/2019 Echo: EF 55-60%; c. 09/2020 Echo: EF 35-40%, glob HK. Nl RV size/fxn. Mild MR.   CKD (chronic kidney disease), stage III (HCC)    COPD (chronic obstructive pulmonary disease) ([place])    Coronary artery disease    a. 2011 Cath: nonobs dzs; b. 09/2020 MV: EF 38%. No signif ischemia. ? inf MI vs diaph attenuation. GI uptake artifact.   Diabetes mellitus without complication ([place])    GERD (gastroesophageal reflux disease)    Hyperlipidemia    Hypertension    Morbid obesity ([place])    NICM (nonischemic cardiomyopathy) ([place])    a. 04/2019 Echo: EF 45-50%; b. 02/2019 Echo: EF 55-60%; c. 09/2020 Echo: EF 35-40%, glob HK; d. 09/2020 MV: No ischemia. ? inf infarct vs attenuation.   OSA (obstructive sleep apnea)    Family History  Problem Relation Age of Onset   Diabetes  Mother        2003   Lung cancer Father    Diabetes Sister    Hypertension Sister    Heart disease Sister    Cancer Sister    Bone cancer Brother    Past Surgical History:  Procedure Laterality Date   BACK SURGERY     CARDIAC CATHETERIZATION     CHOLECYSTECTOMY     COLONOSCOPY WITH PROPOFOL N/A 04/12/2019   Procedure: COLONOSCOPY WITH PROPOFOL;  Surgeon: [name], MD;  Location: [name] ENDOSCOPY;  Service: Gastroenterology;  Laterality: N/A;   ESOPHAGOGASTRODUODENOSCOPY N/A 04/12/2019   Procedure: ESOPHAGOGASTRODUODENOSCOPY (EGD);  Surgeon: [name], MD;  Location: [facility] ENDOSCOPY;  Service: Gastroenterology;  Laterality: N/A;   ESOPHAGOGASTRODUODENOSCOPY (EGD) WITH PROPOFOL N/A 11/21/2019   Procedure: ESOPHAGOGASTRODUODENOSCOPY (EGD) WITH PROPOFOL;  Surgeon: [name], MD;  Location: [facility] ENDOSCOPY;  Service: Gastroenterology;  Laterality: N/A;   FLEXIBLE BRONCHOSCOPY Bilateral 05/17/2019   Procedure: FLEXIBLE BRONCHOSCOPY;  Surgeon: [name], MD;  Location: [name] ORS;  Service: Pulmonary;  Laterality: Bilateral;   left arm surgery     nephrectomy Left    PARATHYROIDECTOMY     RIGHT HEART CATH N/A 03/29/2019   Procedure: RIGHT HEART CATH;  Surgeon: [name], MD;  Location: [place] CV LAB;  Service: Cardiovascular;  Laterality: N/A;   Patient Active Problem List  Diagnosis Date Noted   COPD with acute exacerbation ([place]) 01/29/2021   Atrial flutter ([place]) 01/07/2021   Syncope and collapse 11/16/2020   Demand ischemia ([place])    Influenza A 10/30/2020   HLD (hyperlipidemia) 10/06/2020   History of CVA (cerebrovascular accident) 09/25/2020   Gastroesophageal reflux disease without esophagitis 04/19/2020   Pneumonia due to COVID-19 virus 01/30/2020   Elevated troponin I level 01/29/2020   Suspected COVID-19 virus infection 01/29/2020   Lightheadedness 01/29/2020   Hematemesis 01/09/2020   Hospital discharge follow-up 11/26/2019    Generalized weakness 08/08/2019   Obesity (BMI 30-39.9) 08/08/2019   Acute right hip pain 06/17/2019   Inability to ambulate due to hip 06/17/2019   CKD stage 3 due to type 2 diabetes mellitus ([place])    Hypervolemia    Type 2 diabetes mellitus with stage 3b chronic kidney disease, with long-term current use of insulin ([place])    Hypomagnesemia    Full code status 05/20/2019   Pleuritic chest pain    Multifocal pneumonia 05/19/2019   Acute kidney injury superimposed on CKD 3b ([place]) 05/09/2019   Hemoptysis 04/09/2019   Shortness of breath 03/26/2019   Uncontrolled type 2 diabetes mellitus with insulin therapy ([place]) 03/12/2019   Acute on chronic heart failure with preserved ejection fraction (HFpEF) (HCC)    Stage 3b chronic kidney disease ([place])    Chronic systolic CHF (congestive heart failure) ([place]) 28/31/5176   Diastolic dysfunction 16/12/3708   Iron deficiency anemia 12/31/2018   Atopic dermatitis 11/24/2018   Screening for colon cancer 11/06/2017   Uncontrolled type 2 diabetes mellitus with hyperglycemia ([place]) 11/06/2017   Hidradenitis 06/09/2017   Atherosclerotic heart disease of native coronary artery without angina pectoris 06/09/2017   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   Obstructive sleep apnea, adult 06/09/2017   Morbid (severe) obesity due to excess calories ([place]) 06/09/2017   Cigarette nicotine dependence with nicotine-induced disorder 06/09/2017   Diabetic polyneuropathy associated with type 2 diabetes mellitus ([place]) 06/09/2017   Allergic rhinitis due to pollen 06/09/2017   Mixed hyperlipidemia 06/09/2017   COPD (chronic obstructive pulmonary disease) ([place]) 06/09/2017   Dyspnea on exertion 06/09/2017   Wheezing 06/09/2017   Dysuria 06/09/2017   Snoring 06/09/2017   Essential hypertension 06/09/2017   Personal history of other malignant neoplasm of kidney 06/09/2017   Pain in right hip 06/09/2017   Impacted cerumen, bilateral  06/09/2017   Secondary hyperparathyroidism, not elsewhere classified ([place]) 06/09/2017   Type 2 diabetes mellitus with hyperglycemia ([place]) 06/09/2017   Tinea corporis 06/09/2017      Prior to Admission medications   Medication Sig Start Date End Date Taking? Authorizing Provider  albuterol (VENTOLIN HFA) 108 (90 Base) MCG/ACT inhaler Inhale 2 puffs into the lungs every 6 (six) hours as needed for wheezing or shortness of breath. 08/31/20   Lavera Guise, MD  apixaban (ELIQUIS) 2.5 MG TABS tablet Take 1 tablet (2.5 mg total) by mouth 2 (two) times daily. 01/07/21   Arta Silence, MD  Blood Glucose Calibration (TRUE METRIX LEVEL 1) Low SOLN Use as directed 04/04/18   [name], NP  Blood Glucose Monitoring Suppl (TRUE METRIX AIR GLUCOSE METER) DEVI 1 Device by Does not apply route 2 (two) times daily. 04/13/18   [name], NP  budesonide-formoterol (SYMBICORT) 160-4.5 MCG/ACT inhaler Inhale 2 puffs into the lungs 2 (two) times daily. 07/20/20   Lavera Guise, MD  carvedilol (COREG) 12.5 MG tablet Take 1 tablet (12.5 mg  total) by mouth 2 (two) times daily. 01/07/21 01/07/22  Arta Silence, MD  cetirizine (ZYRTEC) 10 MG tablet Take 10 mg by mouth daily.    [provider]  doxycycline (VIBRA-TABS) 100 MG tablet Take 1 tablet (100 mg total) by mouth every 12 (twelve) hours. 01/29/21   Annita Brod, MD  escitalopram (LEXAPRO) 10 MG tablet Take one tab po qd for anxiety with food 07/17/20   Lavera Guise, MD  furosemide (LASIX) 40 MG tablet Take one to tabs once or twice days for fluid // swelling 07/17/20   Lavera Guise, MD  gabapentin (NEURONTIN) 100 MG capsule Take 1 capsule (100 mg total) by mouth in the morning, at noon, and at bedtime. 07/17/20   Lavera Guise, MD  glimepiride Jari Sportsman) 2 MG tablet Take one tab po qd with supper 07/17/20   Lavera Guise, MD  glucose blood (TRUE METRIX BLOOD GLUCOSE TEST) test strip Use as instructed twice daily diag E11.65 07/30/19    [name], NP  hydrALAZINE (APRESOLINE) 100 MG tablet Take 1 tablet (100 mg total) by mouth 3 (three) times daily. 07/17/20   Lavera Guise, MD  insulin glargine, 1 Unit Dial, (TOUJEO) 300 UNIT/ML Solostar Pen Inject 26 Units into the skin daily. 04/30/20   [name], NP  ipratropium-albuterol (DUONEB) 0.5-2.5 (3) MG/3ML SOLN Take 3 mLs by nebulization every 6 (six) hours as needed. 10/11/20   Lavera Guise, MD  isosorbide mononitrate (IMDUR) 30 MG 24 hr tablet Take 1 tablet (30 mg total) by mouth 2 (two) times daily. 10/08/20   Jennye Boroughs, MD  meclizine (ANTIVERT) 25 MG tablet Take 1 tablet (25 mg total) by mouth 3 (three) times daily as needed for dizziness or nausea. 11/25/20   Paulette Blanch, MD  NOVOFINE PEN NEEDLE 32G X 6 MM MISC Use as directed 10/15/20   Lavera Guise, MD  omeprazole (PRILOSEC) 40 MG capsule Take 1 capsule (40 mg total) by mouth in the morning and at bedtime. 01/26/21   Lavera Guise, MD  potassium chloride SA (KLOR-CON) 20 MEQ tablet Take 1 tablet (20 mEq total) by mouth 2 (two) times daily. 07/17/20   Lavera Guise, MD  predniSONE (DELTASONE) 20 MG tablet Take 2 tablets (40 mg total) by mouth daily with breakfast. 01/30/21   Annita Brod, MD  rosuvastatin (CRESTOR) 10 MG tablet Take 10 mg by mouth at bedtime. 12/12/20   [provider]  tiotropium (SPIRIVA HANDIHALER) 18 MCG inhalation capsule Place 1 capsule (18 mcg total) into inhaler and inhale daily. 11/10/20 02/08/21  Lavera Guise, MD  TRUEplus Lancets 33G MISC Use as directed twice daily diag E11.65 07/30/19   [name], NP  vitamin B-12 1000 MCG tablet Take 1 tablet (1,000 mcg total) by mouth daily. 01/16/21   Fritzi Mandes, MD    Allergies Penicillins    Social History Social History   Tobacco Use   Smoking status: Former    Types: Cigarettes   Smokeless tobacco: Former    Types: Nurse, children's Use: Never used  Substance Use Topics   Alcohol use: No   Drug use: No     Review of Systems Patient denies headaches, rhinorrhea, blurry vision, numbness, shortness of breath, chest pain, edema, cough, abdominal pain, nausea, vomiting, diarrhea, dysuria, fevers, rashes or hallucinations unless otherwise stated above in HPI. ____________________________________________   PHYSICAL EXAM:  VITAL SIGNS: Vitals:   02/18/21  1900 02/18/21 1933  BP: (!) 161/90 (!) 179/136  Pulse: 69 69  Resp: 20 17  Temp:    SpO2: 96% 96%    Constitutional: Alert and oriented.  Eyes: Conjunctivae are normal.  Head: Atraumatic. Nose: No congestion/rhinnorhea. Mouth/Throat: Mucous membranes are moist.   Neck: No stridor. Painless ROM.  Cardiovascular: Normal rate, regular rhythm. Grossly normal heart sounds.  Good peripheral circulation. Respiratory: Normal respiratory effort.  No retractions. Lungs CTAB. Gastrointestinal: Soft and nontender. No distention. No abdominal bruits. No CVA tenderness. Genitourinary:  Musculoskeletal: No lower extremity tenderness nor edema.  No joint effusions. Neurologic:  CN- intact.  No facial droop, Normal FNF.  Normal heel to shin.  Sensation intact bilaterally. Normal speech and language. No gross focal neurologic deficits are appreciated. No gait instability.  Skin:  Skin is warm, dry and intact. No rash noted. Psychiatric: Mood and affect are normal. Speech and behavior are normal.  ____________________________________________   LABS (all labs ordered are listed, but only abnormal results are displayed)  Results for orders placed or performed during the hospital encounter of 02/18/21 (from the past 24 hour(s))  Basic metabolic panel     Status: Abnormal   Collection Time: 02/18/21  5:05 PM  Result Value Ref Range   Sodium 139 135 - 145 mmol/L   Potassium 4.2 3.5 - 5.1 mmol/L   Chloride 109 98 - 111 mmol/L   CO2 23 22 - 32 mmol/L   Glucose, Bld 194 (H) 70 - 99 mg/dL   BUN 44 (H) 8 - 23 mg/dL   Creatinine, Ser 2.24 (H) 0.61 -  1.24 mg/dL   Calcium 8.5 (L) 8.9 - 10.3 mg/dL   GFR, Estimated 31 (L) >60 mL/min   Anion gap 7 5 - 15  CBC     Status: Abnormal   Collection Time: 02/18/21  5:05 PM  Result Value Ref Range   WBC 5.5 4.0 - 10.5 K/uL   RBC 3.97 (L) 4.22 - 5.81 MIL/uL   Hemoglobin 10.6 (L) 13.0 - 17.0 g/dL   HCT 33.0 (L) 39.0 - 52.0 %   MCV 83.1 80.0 - 100.0 fL   MCH 26.7 26.0 - 34.0 pg   MCHC 32.1 30.0 - 36.0 g/dL   RDW 15.1 11.5 - 15.5 %   Platelets 172 150 - 400 K/uL   nRBC 0.0 0.0 - 0.2 %  Troponin I (High Sensitivity)     Status: None   Collection Time: 02/18/21  5:05 PM  Result Value Ref Range   Troponin I (High Sensitivity) 15 <18 ng/L   ____________________________________________  EKG My review and personal interpretation at Time: 17:07   Indication: tingling  Rate: 85  Rhythm: variably conducted aflutter Axis: normal Other: normal intervals, no stemi ____________________________________________  RADIOLOGY  I personally reviewed all radiographic images ordered to evaluate for the above acute complaints and reviewed radiology reports and findings.  These findings were personally discussed with the patient.  Please see medical record for radiology report.  ____________________________________________   PROCEDURES  Procedure(s) performed:  Procedures    Critical Care performed: no ____________________________________________   INITIAL IMPRESSION / ASSESSMENT AND PLAN / ED COURSE  Pertinent labs & imaging results that were available during my care of the patient were reviewed by me and considered in my medical decision making (see chart for details).   DDX: COPD, CHF, ACS, CVA, TIA, anxiety, noncompliance, musculoskeletal strain  Larry Callahan is a 65 y.o. who presents to the ED with presentation as  described above.  Patient complaining of episode of numbness and tingling of the right face and arm symptoms are resolved.  No objective neurodeficits at this time.  No  facial droop.  Has history of TIA with risk factors but appears to be medically optimized.  Also complain of shortness of breath does have some mild wheezing on exam we will give nebulizer.  Not complaining of any lower extremity edema.  Possible CHF.  Initial troponin is negative.  Have lower suspicion for ACS.  Clinical Course as of 02/18/21 2008  Thu Feb 18, 2021  1753 [facility]: 26.7 [PR]  1934 Discussed numbness and tingling symptoms with neurology.  Patient already optimized on Eliquis as well as statin.  CT head unremarkable.  Just recently had echo as well as MRA.  His symptoms have not persisted will order MRI without contrast if negative would be appropriate for outpatient follow-up [PR]    Clinical Course User Index [PR] Merlyn Lot, MD    The patient was evaluated in Emergency Department today for the symptoms described in the history of present illness. He/she was evaluated in the context of the global COVID-19 pandemic, which necessitated consideration that the patient might be at risk for infection with the SARS-CoV-2 virus that causes COVID-19. Institutional protocols and algorithms that pertain to the evaluation of patients at risk for COVID-19 are in a state of rapid change based on information released by regulatory bodies including the CDC and federal and state organizations. These policies and algorithms were followed during the patient's care in the ED.  As part of my medical decision making, I reviewed the following data within the [place] notes reviewed and incorporated, Labs reviewed, notes from prior ED visits and [place] Controlled Substance Database   ____________________________________________   FINAL CLINICAL IMPRESSION(S) / ED DIAGNOSES  Final diagnoses:  Atypical chest pain  Numbness and tingling of right side of face      NEW MEDICATIONS STARTED DURING THIS VISIT:  New Prescriptions   No medications on file     Note:  This  document was prepared using Dragon voice recognition software and may include unintentional dictation errors.    Merlyn Lot, MD 02/18/21 2009

## 2021-02-18 NOTE — ED Notes (Signed)
Ambulatory pulse ox 94%

## 2021-02-18 NOTE — ED Notes (Signed)
Vitals correction.... Pt pulse 69

## 2021-02-18 NOTE — Chronic Care Management (AMB) (Signed)
  Chronic Care Management   Note  02/18/2021 Name: Larry Callahan MRN: [id number] DOB: 02-Jan-1956  [name] is a 65 y.o. year old male who is a primary care patient of Lavera Guise, MD. I reached out to Larry Callahan by phone today in response to a referral sent by Larry Callahan, Lavera Guise, MD.   Larry Callahan was given information about Chronic Care Management services today including:  CCM service includes personalized support from designated clinical staff supervised by his physician, including individualized plan of care and coordination with other care providers 24/7 contact phone numbers for assistance for urgent and routine care needs. Service will only be billed when office clinical staff spend 20 minutes or more in a month to coordinate care. Only one practitioner may furnish and bill the service in a calendar month. The patient may stop CCM services at any time (effective at the end of the month) by phone call to the office staff.   Patient agreed to services and verbal consent obtained.   Follow up plan: PT AWARE $15 COPAY  Tatjana Dellinger [employer] Scheduler

## 2021-02-18 NOTE — Telephone Encounter (Signed)
Pt called and stated that he was having pain under his right breast and has gotten worse and worse, also having SOB.  I told pt that we can make him an appt for Friday 02/19/2021 and pt stated that he didn't have a ride to our office.  I advised pt that if he is not able to come for an appointment that he will need to be evaluated at the hospital.  Pt has appointment on 02/23/21 and we informed pt that if he needed to move his appointment up to let us know.  We told patient multiple times that if he can't come into office to be seen for his complaints that he will need to call 911 To go to hospital

## 2021-02-20 DIAGNOSIS — I5043 Acute on chronic combined systolic (congestive) and diastolic (congestive) heart failure: Secondary | ICD-10-CM | POA: Diagnosis not present

## 2021-02-22 ENCOUNTER — Inpatient Hospital Stay: Payer: Medicare PPO | Admitting: Internal Medicine

## 2021-02-23 ENCOUNTER — Ambulatory Visit (INDEPENDENT_AMBULATORY_CARE_PROVIDER_SITE_OTHER): Payer: Medicare PPO | Admitting: Nurse Practitioner

## 2021-02-23 ENCOUNTER — Encounter: Payer: Self-pay | Admitting: Nurse Practitioner

## 2021-02-23 ENCOUNTER — Other Ambulatory Visit: Payer: Self-pay

## 2021-02-23 VITALS — BP 160/100 | HR 96 | Temp 98.2°F | Resp 16 | Ht 71.0 in | Wt 270.2 lb

## 2021-02-23 DIAGNOSIS — N1832 Chronic kidney disease, stage 3b: Secondary | ICD-10-CM | POA: Diagnosis not present

## 2021-02-23 DIAGNOSIS — Z23 Encounter for immunization: Secondary | ICD-10-CM | POA: Diagnosis not present

## 2021-02-23 DIAGNOSIS — E1140 Type 2 diabetes mellitus with diabetic neuropathy, unspecified: Secondary | ICD-10-CM | POA: Diagnosis not present

## 2021-02-23 DIAGNOSIS — F411 Generalized anxiety disorder: Secondary | ICD-10-CM | POA: Diagnosis not present

## 2021-02-23 DIAGNOSIS — J449 Chronic obstructive pulmonary disease, unspecified: Secondary | ICD-10-CM

## 2021-02-23 DIAGNOSIS — Z794 Long term (current) use of insulin: Secondary | ICD-10-CM | POA: Diagnosis not present

## 2021-02-23 DIAGNOSIS — E1122 Type 2 diabetes mellitus with diabetic chronic kidney disease: Secondary | ICD-10-CM

## 2021-02-23 DIAGNOSIS — I10 Essential (primary) hypertension: Secondary | ICD-10-CM

## 2021-02-23 MED ORDER — GLIMEPIRIDE 2 MG PO TABS: ORAL_TABLET | ORAL | 1 refills | Status: DC

## 2021-02-23 MED ORDER — FUROSEMIDE 40 MG PO TABS: ORAL_TABLET | ORAL | 1 refills | Status: DC

## 2021-02-23 MED ORDER — ESCITALOPRAM OXALATE 10 MG PO TABS: ORAL_TABLET | ORAL | 3 refills | Status: DC

## 2021-02-23 MED ORDER — GABAPENTIN 100 MG PO CAPS: 100.0000 mg | ORAL_CAPSULE | Freq: Three times a day (TID) | ORAL | 1 refills | Status: DC

## 2021-02-23 MED ORDER — POTASSIUM CHLORIDE CRYS ER 20 MEQ PO TBCR: 20.0000 meq | EXTENDED_RELEASE_TABLET | Freq: Two times a day (BID) | ORAL | 1 refills | Status: DC

## 2021-02-23 MED ORDER — ROSUVASTATIN CALCIUM 10 MG PO TABS: 10.0000 mg | ORAL_TABLET | Freq: Every day | ORAL | 1 refills | Status: DC

## 2021-02-23 MED ORDER — HYDRALAZINE HCL 100 MG PO TABS: 100.0000 mg | ORAL_TABLET | Freq: Three times a day (TID) | ORAL | 1 refills | Status: DC

## 2021-02-23 MED ORDER — AMLODIPINE BESYLATE 5 MG PO TABS: 5.0000 mg | ORAL_TABLET | Freq: Every day | ORAL | 0 refills | Status: DC

## 2021-02-23 MED ORDER — PNEUMOCOCCAL 20-VAL CONJ VACC 0.5 ML IM SUSY: 0.5000 mL | PREFILLED_SYRINGE | INTRAMUSCULAR | 0 refills | Status: AC

## 2021-02-23 MED ORDER — BREZTRI AEROSPHERE 160-9-4.8 MCG/ACT IN AERO: 2.0000 | INHALATION_SPRAY | Freq: Two times a day (BID) | RESPIRATORY_TRACT | 3 refills | Status: DC

## 2021-02-23 MED ORDER — INSULIN GLARGINE (1 UNIT DIAL) 300 UNIT/ML ~~LOC~~ SOPN: 26.0000 [IU] | PEN_INJECTOR | Freq: Every day | SUBCUTANEOUS | 3 refills | Status: DC

## 2021-02-23 NOTE — Progress Notes (Signed)
[facility] [place], [place] [address]  Internal MEDICINE  Office Visit Note  Patient Name: Larry Callahan  [id number]  [id number]  Date of Service: 02/23/2021  Chief Complaint  Patient presents with   Follow-up    Review meds   Diabetes   Gastroesophageal Reflux   Hyperlipidemia   Hypertension   COPD   Quality Metric Gaps    Will get flu shot at pharmacy     HPI Larry Callahan was seen in ED on 02/18/21/ for atypical chest pain and numbness/tingling of the right side of the face. Imaging and labs done in ER were reviewed and there was not enough evidence of a specific diagnosis. Medications were reviewed with the patient in office today and refills were ordered. He is also on several different maintenance inhalers so will trial Breztri to see if it helps.  Blood pressure is elevated and remains elevated when rechecked, please see vitals.    Current Medication: Outpatient Encounter Medications as of 02/23/2021  Medication Sig Note   albuterol (VENTOLIN HFA) 108 (90 Base) MCG/ACT inhaler Inhale 2 puffs into the lungs every 6 (six) hours as needed for wheezing or shortness of breath.    amLODipine (NORVASC) 5 MG tablet Take 1 tablet (5 mg total) by mouth daily.    apixaban (ELIQUIS) 2.5 MG TABS tablet Take 1 tablet (2.5 mg total) by mouth 2 (two) times daily.    Blood Glucose Calibration (TRUE METRIX LEVEL 1) Low SOLN Use as directed    Blood Glucose Monitoring Suppl (TRUE METRIX AIR GLUCOSE METER) DEVI 1 Device by Does not apply route 2 (two) times daily.    Budeson-Glycopyrrol-Formoterol (BREZTRI AEROSPHERE) 160-9-4.8 MCG/ACT AERO Inhale 2 puffs into the lungs in the morning and at bedtime.    carvedilol (COREG) 12.5 MG tablet Take 1 tablet (12.5 mg total) by mouth 2 (two) times daily.    cetirizine (ZYRTEC) 10 MG tablet Take 10 mg by mouth daily.    glucose blood (TRUE METRIX BLOOD GLUCOSE TEST) test strip Use as instructed twice daily diag E11.65     ipratropium-albuterol (DUONEB) 0.5-2.5 (3) MG/3ML SOLN Take 3 mLs by nebulization every 6 (six) hours as needed.    isosorbide mononitrate (IMDUR) 30 MG 24 hr tablet Take 1 tablet (30 mg total) by mouth 2 (two) times daily.    meclizine (ANTIVERT) 25 MG tablet Take 1 tablet (25 mg total) by mouth 3 (three) times daily as needed for dizziness or nausea.    NOVOFINE PEN NEEDLE 32G X 6 MM MISC Use as directed    omeprazole (PRILOSEC) 40 MG capsule Take 1 capsule (40 mg total) by mouth in the morning and at bedtime.    TRUEplus Lancets 33G MISC Use as directed twice daily diag E11.65    vitamin B-12 1000 MCG tablet Take 1 tablet (1,000 mcg total) by mouth daily.    [DISCONTINUED] budesonide-formoterol (SYMBICORT) 160-4.5 MCG/ACT inhaler Inhale 2 puffs into the lungs 2 (two) times daily.    [DISCONTINUED] doxycycline (VIBRA-TABS) 100 MG tablet Take 1 tablet (100 mg total) by mouth every 12 (twelve) hours.    [DISCONTINUED] escitalopram (LEXAPRO) 10 MG tablet Take one tab po qd for anxiety with food    [DISCONTINUED] furosemide (LASIX) 40 MG tablet Take one to tabs once or twice days for fluid // swelling    [DISCONTINUED] gabapentin (NEURONTIN) 100 MG capsule Take 1 capsule (100 mg total) by mouth in the morning, at noon, and at bedtime.    [  DISCONTINUED] glimepiride (AMARYL) 2 MG tablet Take one tab po qd with supper    [DISCONTINUED] hydrALAZINE (APRESOLINE) 100 MG tablet Take 1 tablet (100 mg total) by mouth 3 (three) times daily. 10/30/2020: Pt takes in the morning, another at 1 pm and the last one in the evening   [DISCONTINUED] insulin glargine, 1 Unit Dial, (TOUJEO) 300 UNIT/ML Solostar Pen Inject 26 Units into the skin daily.    [DISCONTINUED] pneumococcal 20-Val Conj Vacc (PREVNAR 20) 0.5 ML injection Inject 0.5 mLs into the muscle tomorrow at 10 am.    [DISCONTINUED] potassium chloride SA (KLOR-CON) 20 MEQ tablet Take 1 tablet (20 mEq total) by mouth 2 (two) times daily.    [DISCONTINUED]  predniSONE (DELTASONE) 20 MG tablet Take 2 tablets (40 mg total) by mouth daily with breakfast.    [DISCONTINUED] rosuvastatin (CRESTOR) 10 MG tablet Take 10 mg by mouth at bedtime.    escitalopram (LEXAPRO) 10 MG tablet Take one tab po qd for anxiety with food    furosemide (LASIX) 40 MG tablet Take one to tabs once or twice days for fluid // swelling    gabapentin (NEURONTIN) 100 MG capsule Take 1 capsule (100 mg total) by mouth in the morning, at noon, and at bedtime.    glimepiride (AMARYL) 2 MG tablet Take one tab po qd with supper    hydrALAZINE (APRESOLINE) 100 MG tablet Take 1 tablet (100 mg total) by mouth 3 (three) times daily.    [EXPIRED] pneumococcal 20-Val Conj Vacc (PREVNAR 20) 0.5 ML injection Inject 0.5 mLs into the muscle tomorrow at 10 am for 1 dose.    rosuvastatin (CRESTOR) 10 MG tablet Take 1 tablet (10 mg total) by mouth at bedtime.    [DISCONTINUED] insulin glargine, 1 Unit Dial, (TOUJEO) 300 UNIT/ML Solostar Pen Inject 26 Units into the skin daily.    [DISCONTINUED] potassium chloride SA (KLOR-CON) 20 MEQ tablet Take 1 tablet (20 mEq total) by mouth 2 (two) times daily.    [DISCONTINUED] tiotropium (SPIRIVA HANDIHALER) 18 MCG inhalation capsule Place 1 capsule (18 mcg total) into inhaler and inhale daily.    No facility-administered encounter medications on file as of 02/23/2021.    Surgical History: Past Surgical History:  Procedure Laterality Date   BACK SURGERY     CARDIAC CATHETERIZATION     CHOLECYSTECTOMY     COLONOSCOPY WITH PROPOFOL N/A 04/12/2019   Procedure: COLONOSCOPY WITH PROPOFOL;  Surgeon: [name], MD;  Location: [facility] ENDOSCOPY;  Service: Gastroenterology;  Laterality: N/A;   ESOPHAGOGASTRODUODENOSCOPY N/A 04/12/2019   Procedure: ESOPHAGOGASTRODUODENOSCOPY (EGD);  Surgeon: [name], MD;  Location[facility] ENDOSCOPY;  Service: Gastroenterology;  Laterality: N/A;   ESOPHAGOGASTRODUODENOSCOPY (EGD) WITH PROPOFOL N/A 11/21/2019    Procedure: ESOPHAGOGASTRODUODENOSCOPY (EGD) WITH PROPOFOL;  Surgeon: [name], MD;  Location: [facility] ENDOSCOPY;  Service: Gastroenterology;  Laterality: N/A;   FLEXIBLE BRONCHOSCOPY Bilateral 05/17/2019   Procedure: FLEXIBLE BRONCHOSCOPY;  Surgeon: [name], MD;  Location: [name] ORS;  Service: Pulmonary;  Laterality: Bilateral;   left arm surgery     nephrectomy Left    PARATHYROIDECTOMY     RIGHT HEART CATH N/A 03/29/2019   Procedure: RIGHT HEART CATH;  Surgeon: [name], MD;  Location: [place] CV LAB;  Service: Cardiovascular;  Laterality: N/A;    Medical History: Past Medical History:  Diagnosis Date   Allergy    Chronic combined systolic (congestive) and diastolic (congestive) heart failure ([place])    a. 04/2019 Echo: EF 45-50%; b. 02/2019  Echo: EF 55-60%; c. 09/2020 Echo: EF 35-40%, glob HK. Nl RV size/fxn. Mild MR.   CKD (chronic kidney disease), stage III (HCC)    COPD (chronic obstructive pulmonary disease) ([place])    Coronary artery disease    a. 2011 Cath: nonobs dzs; b. 09/2020 MV: EF 38%. No signif ischemia. ? inf MI vs diaph attenuation. GI uptake artifact.   Diabetes mellitus without complication ([place])    GERD (gastroesophageal reflux disease)    Hyperlipidemia    Hypertension    Morbid obesity ([place])    NICM (nonischemic cardiomyopathy) ([place])    a. 04/2019 Echo: EF 45-50%; b. 02/2019 Echo: EF 55-60%; c. 09/2020 Echo: EF 35-40%, glob HK; d. 09/2020 MV: No ischemia. ? inf infarct vs attenuation.   OSA (obstructive sleep apnea)     Family History: Family History  Problem Relation Age of Onset   Diabetes Mother        2003   Lung cancer Father    Diabetes Sister    Hypertension Sister    Heart disease Sister    Cancer Sister    Bone cancer Brother     Social History   Socioeconomic History   Marital status: Single    Spouse name: Not on file   Number of children: Not on file   Years of education: Not on file   Highest education level: Not  on file  Occupational History   Not on file  Tobacco Use   Smoking status: Former    Types: Cigarettes   Smokeless tobacco: Former    Types: Nurse, children's Use: Never used  Substance and Sexual Activity   Alcohol use: No   Drug use: No   Sexual activity: Not Currently  Other Topics Concern   Not on file  Social History Narrative   Not on file   Social Determinants of Health   Financial Resource Strain: Not on file  Food Insecurity: Not on file  Transportation Needs: Not on file  Physical Activity: Not on file  Stress: Not on file  Social Connections: Not on file  Intimate Partner Violence: Not on file      Review of Systems  Constitutional:  Negative for chills, fatigue and unexpected weight change.  HENT:  Negative for congestion, rhinorrhea, sneezing and sore throat.   Eyes:  Negative for redness.  Respiratory:  Positive for shortness of breath. Negative for cough, chest tightness and wheezing.   Cardiovascular:  Negative for chest pain and palpitations.  Gastrointestinal:  Negative for abdominal pain, constipation, diarrhea, nausea and vomiting.  Genitourinary:  Negative for dysuria and frequency.  Musculoskeletal:  Negative for arthralgias, back pain, joint swelling and neck pain.  Skin:  Negative for rash.  Neurological: Negative.  Negative for tremors, numbness and headaches.  Hematological:  Negative for adenopathy. Does not bruise/bleed easily.  Psychiatric/Behavioral:  Negative for behavioral problems (Depression), sleep disturbance and suicidal ideas. The patient is not nervous/anxious.    Vital Signs: BP (!) 160/100 Comment: 184/100  Pulse 96   Temp 98.2 F (36.8 C)   Resp 16   Ht _0  (1.803 m)   Wt 270 lb 3.2 oz (122.6 kg)   SpO2 99%   BMI 37.69 kg/m    Physical Exam Constitutional:      General: He is not in acute distress.    Appearance: Normal appearance. He is obese. He is not ill-appearing.  HENT:     Head: Normocephalic  and atraumatic.  Eyes:     Extraocular Movements: Extraocular movements intact.     Pupils: Pupils are equal, round, and reactive to light.  Pulmonary:     Effort: Pulmonary effort is normal. No respiratory distress.     Breath sounds: Normal breath sounds.     Comments: Clear but decreased breath sounds Neurological:     Mental Status: He is alert and oriented to person, place, and time.     Cranial Nerves: No cranial nerve deficit.     Coordination: Coordination normal.     Gait: Gait normal.  Psychiatric:        Mood and Affect: Mood normal.        Behavior: Behavior normal.     Comments: Rapid pressured speech     Assessment/Plan: All refills ordered 1. Benign hypertension Blood pressure significantly elevated, start amlodipine 5 mg daily, follow up in 4 weeks.  - hydrALAZINE (APRESOLINE) 100 MG tablet; Take 1 tablet (100 mg total) by mouth 3 (three) times daily.  Dispense: 270 tablet; Refill: 1 - amLODipine (NORVASC) 5 MG tablet; Take 1 tablet (5 mg total) by mouth daily.  Dispense: 30 tablet; Refill: 0  2. Chronic obstructive pulmonary disease, unspecified COPD type ([place]) Switched patient from multiple inhalers to one triple-therapy inhaler - breztri, samples provided.  - Budeson-Glycopyrrol-Formoterol (BREZTRI AEROSPHERE) 160-9-4.8 MCG/ACT AERO; Inhale 2 puffs into the lungs in the morning and at bedtime.  Dispense: 17.7 g; Refill: 3  3. Type 2 diabetes mellitus with stage 3b chronic kidney disease, with long-term current use of insulin (HCC) - glimepiride (AMARYL) 2 MG tablet; Take one tab po qd with supper  Dispense: 90 tablet; Refill: 1  4. Neuropathy due to type 2 diabetes mellitus (HCC) - gabapentin (NEURONTIN) 100 MG capsule; Take 1 capsule (100 mg total) by mouth in the morning, at noon, and at bedtime.  Dispense: 270 capsule; Refill: 1  5. GAD (generalized anxiety disorder) - escitalopram (LEXAPRO) 10 MG tablet; Take one tab po qd for anxiety with food  Dispense:  90 tablet; Refill: 3  6. Need for vaccination - pneumococcal 20-Val Conj Vacc (PREVNAR 20) 0.5 ML injection; Inject 0.5 mLs into the muscle tomorrow at 10 am for 1 dose.  Dispense: 0.5 mL; Refill: 0    General Counseling: Larry Callahan verbalizes understanding of the findings of todays visit and agrees with plan of treatment. I have discussed any further diagnostic evaluation that may be needed or ordered today. We also reviewed his medications today. he has been encouraged to call the office with any questions or concerns that should arise related to todays visit.    No orders of the defined types were placed in this encounter.   Meds ordered this encounter  Medications   pneumococcal 20-Val Conj Vacc (PREVNAR 20) 0.5 ML injection    Sig: Inject 0.5 mLs into the muscle tomorrow at 10 am for 1 dose.    Dispense:  0.5 mL    Refill:  0   DISCONTD: insulin glargine, 1 Unit Dial, (TOUJEO) 300 UNIT/ML Solostar Pen    Sig: Inject 26 Units into the skin daily.    Dispense:  6 mL    Refill:  3   DISCONTD: potassium chloride SA (KLOR-CON) 20 MEQ tablet    Sig: Take 1 tablet (20 mEq total) by mouth 2 (two) times daily.    Dispense:  180 tablet    Refill:  1   hydrALAZINE (APRESOLINE) 100 MG tablet    Sig: Take 1 tablet (  100 mg total) by mouth 3 (three) times daily.    Dispense:  270 tablet    Refill:  1   gabapentin (NEURONTIN) 100 MG capsule    Sig: Take 1 capsule (100 mg total) by mouth in the morning, at noon, and at bedtime.    Dispense:  270 capsule    Refill:  1   rosuvastatin (CRESTOR) 10 MG tablet    Sig: Take 1 tablet (10 mg total) by mouth at bedtime.    Dispense:  90 tablet    Refill:  1   escitalopram (LEXAPRO) 10 MG tablet    Sig: Take one tab po qd for anxiety with food    Dispense:  90 tablet    Refill:  3   furosemide (LASIX) 40 MG tablet    Sig: Take one to tabs once or twice days for fluid // swelling    Dispense:  180 tablet    Refill:  1   glimepiride (AMARYL) 2 MG  tablet    Sig: Take one tab po qd with supper    Dispense:  90 tablet    Refill:  1   Budeson-Glycopyrrol-Formoterol (BREZTRI AEROSPHERE) 160-9-4.8 MCG/ACT AERO    Sig: Inhale 2 puffs into the lungs in the morning and at bedtime.    Dispense:  17.7 g    Refill:  3   amLODipine (NORVASC) 5 MG tablet    Sig: Take 1 tablet (5 mg total) by mouth daily.    Dispense:  30 tablet    Refill:  0    Return in 4 weeks (on 03/23/2021) for previously scheduled, Alis Sawchuk PCP.   Total time spent:30 Minutes Time spent includes review of chart, medications, test results, and follow up plan with the patient.   [place] Controlled Substance Database was reviewed by me.  This patient was seen by [name], FNP-C in collaboration with Dr. Clayborn Bigness as a part of collaborative care agreement.   Eliezer Khawaja R. Valetta Fuller, MSN, FNP-C Internal medicine

## 2021-02-25 DIAGNOSIS — J441 Chronic obstructive pulmonary disease with (acute) exacerbation: Secondary | ICD-10-CM | POA: Diagnosis not present

## 2021-02-25 DIAGNOSIS — I2510 Atherosclerotic heart disease of native coronary artery without angina pectoris: Secondary | ICD-10-CM | POA: Diagnosis not present

## 2021-02-25 DIAGNOSIS — I5042 Chronic combined systolic (congestive) and diastolic (congestive) heart failure: Secondary | ICD-10-CM | POA: Diagnosis not present

## 2021-02-25 DIAGNOSIS — I110 Hypertensive heart disease with heart failure: Secondary | ICD-10-CM | POA: Diagnosis not present

## 2021-02-25 DIAGNOSIS — E1142 Type 2 diabetes mellitus with diabetic polyneuropathy: Secondary | ICD-10-CM | POA: Diagnosis not present

## 2021-02-25 DIAGNOSIS — I428 Other cardiomyopathies: Secondary | ICD-10-CM | POA: Diagnosis not present

## 2021-02-25 DIAGNOSIS — N1832 Chronic kidney disease, stage 3b: Secondary | ICD-10-CM | POA: Diagnosis not present

## 2021-02-25 DIAGNOSIS — I4891 Unspecified atrial fibrillation: Secondary | ICD-10-CM | POA: Diagnosis not present

## 2021-02-25 DIAGNOSIS — E1122 Type 2 diabetes mellitus with diabetic chronic kidney disease: Secondary | ICD-10-CM | POA: Diagnosis not present

## 2021-02-26 DIAGNOSIS — E1142 Type 2 diabetes mellitus with diabetic polyneuropathy: Secondary | ICD-10-CM | POA: Diagnosis not present

## 2021-02-26 DIAGNOSIS — I5042 Chronic combined systolic (congestive) and diastolic (congestive) heart failure: Secondary | ICD-10-CM | POA: Diagnosis not present

## 2021-02-26 DIAGNOSIS — I428 Other cardiomyopathies: Secondary | ICD-10-CM | POA: Diagnosis not present

## 2021-02-26 DIAGNOSIS — I4891 Unspecified atrial fibrillation: Secondary | ICD-10-CM | POA: Diagnosis not present

## 2021-02-26 DIAGNOSIS — E1122 Type 2 diabetes mellitus with diabetic chronic kidney disease: Secondary | ICD-10-CM | POA: Diagnosis not present

## 2021-02-26 DIAGNOSIS — I2510 Atherosclerotic heart disease of native coronary artery without angina pectoris: Secondary | ICD-10-CM | POA: Diagnosis not present

## 2021-02-26 DIAGNOSIS — J441 Chronic obstructive pulmonary disease with (acute) exacerbation: Secondary | ICD-10-CM | POA: Diagnosis not present

## 2021-02-26 DIAGNOSIS — I110 Hypertensive heart disease with heart failure: Secondary | ICD-10-CM | POA: Diagnosis not present

## 2021-02-26 DIAGNOSIS — N1832 Chronic kidney disease, stage 3b: Secondary | ICD-10-CM | POA: Diagnosis not present

## 2021-03-01 ENCOUNTER — Emergency Department: Payer: Medicare PPO

## 2021-03-01 ENCOUNTER — Telehealth: Payer: Self-pay

## 2021-03-01 ENCOUNTER — Other Ambulatory Visit: Payer: Self-pay

## 2021-03-01 ENCOUNTER — Emergency Department
Admission: EM | Admit: 2021-03-01 | Discharge: 2021-03-01 | Disposition: A | Discharge status: Home / Self Care | Payer: Medicare PPO | Source: Home / Self Care | Attending: Emergency Medicine | Admitting: Emergency Medicine

## 2021-03-01 DIAGNOSIS — I130 Hypertensive heart and chronic kidney disease with heart failure and stage 1 through stage 4 chronic kidney disease, or unspecified chronic kidney disease: Secondary | ICD-10-CM | POA: Diagnosis present

## 2021-03-01 DIAGNOSIS — I4891 Unspecified atrial fibrillation: Secondary | ICD-10-CM | POA: Diagnosis present

## 2021-03-01 DIAGNOSIS — I2510 Atherosclerotic heart disease of native coronary artery without angina pectoris: Secondary | ICD-10-CM | POA: Diagnosis present

## 2021-03-01 DIAGNOSIS — R531 Weakness: Secondary | ICD-10-CM | POA: Insufficient documentation

## 2021-03-01 DIAGNOSIS — L0390 Cellulitis, unspecified: Secondary | ICD-10-CM

## 2021-03-01 DIAGNOSIS — L03113 Cellulitis of right upper limb: Secondary | ICD-10-CM | POA: Insufficient documentation

## 2021-03-01 DIAGNOSIS — I428 Other cardiomyopathies: Secondary | ICD-10-CM | POA: Diagnosis not present

## 2021-03-01 DIAGNOSIS — H02846 Edema of left eye, unspecified eyelid: Secondary | ICD-10-CM | POA: Insufficient documentation

## 2021-03-01 DIAGNOSIS — Z87891 Personal history of nicotine dependence: Secondary | ICD-10-CM | POA: Insufficient documentation

## 2021-03-01 DIAGNOSIS — N1832 Chronic kidney disease, stage 3b: Secondary | ICD-10-CM | POA: Insufficient documentation

## 2021-03-01 DIAGNOSIS — M7989 Other specified soft tissue disorders: Secondary | ICD-10-CM | POA: Diagnosis not present

## 2021-03-01 DIAGNOSIS — Z6837 Body mass index (BMI) 37.0-37.9, adult: Secondary | ICD-10-CM | POA: Diagnosis not present

## 2021-03-01 DIAGNOSIS — R220 Localized swelling, mass and lump, head: Secondary | ICD-10-CM | POA: Insufficient documentation

## 2021-03-01 DIAGNOSIS — I5022 Chronic systolic (congestive) heart failure: Secondary | ICD-10-CM | POA: Diagnosis not present

## 2021-03-01 DIAGNOSIS — I110 Hypertensive heart disease with heart failure: Secondary | ICD-10-CM | POA: Diagnosis not present

## 2021-03-01 DIAGNOSIS — J438 Other emphysema: Secondary | ICD-10-CM | POA: Diagnosis not present

## 2021-03-01 DIAGNOSIS — Z7951 Long term (current) use of inhaled steroids: Secondary | ICD-10-CM | POA: Insufficient documentation

## 2021-03-01 DIAGNOSIS — Z7901 Long term (current) use of anticoagulants: Secondary | ICD-10-CM | POA: Diagnosis not present

## 2021-03-01 DIAGNOSIS — Z79899 Other long term (current) drug therapy: Secondary | ICD-10-CM | POA: Insufficient documentation

## 2021-03-01 DIAGNOSIS — J441 Chronic obstructive pulmonary disease with (acute) exacerbation: Secondary | ICD-10-CM | POA: Diagnosis not present

## 2021-03-01 DIAGNOSIS — F32A Depression, unspecified: Secondary | ICD-10-CM | POA: Diagnosis present

## 2021-03-01 DIAGNOSIS — R000 Tachycardia, unspecified: Secondary | ICD-10-CM | POA: Insufficient documentation

## 2021-03-01 DIAGNOSIS — E1142 Type 2 diabetes mellitus with diabetic polyneuropathy: Secondary | ICD-10-CM | POA: Diagnosis not present

## 2021-03-01 DIAGNOSIS — E785 Hyperlipidemia, unspecified: Secondary | ICD-10-CM | POA: Diagnosis present

## 2021-03-01 DIAGNOSIS — I483 Typical atrial flutter: Secondary | ICD-10-CM | POA: Diagnosis not present

## 2021-03-01 DIAGNOSIS — I10 Essential (primary) hypertension: Secondary | ICD-10-CM | POA: Diagnosis not present

## 2021-03-01 DIAGNOSIS — I739 Peripheral vascular disease, unspecified: Secondary | ICD-10-CM | POA: Diagnosis not present

## 2021-03-01 DIAGNOSIS — Z7984 Long term (current) use of oral hypoglycemic drugs: Secondary | ICD-10-CM | POA: Insufficient documentation

## 2021-03-01 DIAGNOSIS — N179 Acute kidney failure, unspecified: Secondary | ICD-10-CM | POA: Diagnosis present

## 2021-03-01 DIAGNOSIS — E1122 Type 2 diabetes mellitus with diabetic chronic kidney disease: Secondary | ICD-10-CM | POA: Insufficient documentation

## 2021-03-01 DIAGNOSIS — R079 Chest pain, unspecified: Secondary | ICD-10-CM | POA: Diagnosis not present

## 2021-03-01 DIAGNOSIS — Z8616 Personal history of COVID-19: Secondary | ICD-10-CM | POA: Diagnosis not present

## 2021-03-01 DIAGNOSIS — I959 Hypotension, unspecified: Secondary | ICD-10-CM | POA: Diagnosis not present

## 2021-03-01 DIAGNOSIS — Z905 Acquired absence of kidney: Secondary | ICD-10-CM | POA: Diagnosis not present

## 2021-03-01 DIAGNOSIS — G4733 Obstructive sleep apnea (adult) (pediatric): Secondary | ICD-10-CM | POA: Diagnosis not present

## 2021-03-01 DIAGNOSIS — I4892 Unspecified atrial flutter: Secondary | ICD-10-CM | POA: Diagnosis present

## 2021-03-01 DIAGNOSIS — E669 Obesity, unspecified: Secondary | ICD-10-CM | POA: Diagnosis present

## 2021-03-01 DIAGNOSIS — E782 Mixed hyperlipidemia: Secondary | ICD-10-CM | POA: Diagnosis present

## 2021-03-01 DIAGNOSIS — Z794 Long term (current) use of insulin: Secondary | ICD-10-CM | POA: Diagnosis not present

## 2021-03-01 DIAGNOSIS — R600 Localized edema: Secondary | ICD-10-CM | POA: Diagnosis not present

## 2021-03-01 DIAGNOSIS — E1152 Type 2 diabetes mellitus with diabetic peripheral angiopathy with gangrene: Secondary | ICD-10-CM | POA: Diagnosis present

## 2021-03-01 DIAGNOSIS — R609 Edema, unspecified: Secondary | ICD-10-CM | POA: Diagnosis not present

## 2021-03-01 DIAGNOSIS — T7840XA Allergy, unspecified, initial encounter: Secondary | ICD-10-CM | POA: Diagnosis not present

## 2021-03-01 DIAGNOSIS — I5042 Chronic combined systolic (congestive) and diastolic (congestive) heart failure: Secondary | ICD-10-CM | POA: Insufficient documentation

## 2021-03-01 DIAGNOSIS — W57XXXA Bitten or stung by nonvenomous insect and other nonvenomous arthropods, initial encounter: Secondary | ICD-10-CM | POA: Diagnosis present

## 2021-03-01 DIAGNOSIS — Z8673 Personal history of transient ischemic attack (TIA), and cerebral infarction without residual deficits: Secondary | ICD-10-CM | POA: Diagnosis not present

## 2021-03-01 DIAGNOSIS — J449 Chronic obstructive pulmonary disease, unspecified: Secondary | ICD-10-CM | POA: Diagnosis present

## 2021-03-01 DIAGNOSIS — K219 Gastro-esophageal reflux disease without esophagitis: Secondary | ICD-10-CM | POA: Diagnosis present

## 2021-03-01 DIAGNOSIS — Z9049 Acquired absence of other specified parts of digestive tract: Secondary | ICD-10-CM | POA: Diagnosis not present

## 2021-03-01 LAB — CBC
HCT: 32.5 % — ABNORMAL LOW (ref 39.0–52.0)
Hemoglobin: 10.5 g/dL — ABNORMAL LOW (ref 13.0–17.0)
MCH: 27.4 pg (ref 26.0–34.0)
MCHC: 32.3 g/dL (ref 30.0–36.0)
MCV: 84.9 fL (ref 80.0–100.0)
Platelets: 183 10*3/uL (ref 150–400)
RBC: 3.83 MIL/uL — ABNORMAL LOW (ref 4.22–5.81)
RDW: 15.2 % (ref 11.5–15.5)
WBC: 9.4 10*3/uL (ref 4.0–10.5)
nRBC: 0 % (ref 0.0–0.2)

## 2021-03-01 LAB — BASIC METABOLIC PANEL
Anion gap: 10 (ref 5–15)
BUN: 38 mg/dL — ABNORMAL HIGH (ref 8–23)
CO2: 23 mmol/L (ref 22–32)
Calcium: 8.1 mg/dL — ABNORMAL LOW (ref 8.9–10.3)
Chloride: 103 mmol/L (ref 98–111)
Creatinine, Ser: 2.45 mg/dL — ABNORMAL HIGH (ref 0.61–1.24)
GFR, Estimated: 28 mL/min — ABNORMAL LOW (ref >60)
Glucose, Bld: 240 mg/dL — ABNORMAL HIGH (ref 70–99)
Potassium: 4 mmol/L (ref 3.5–5.1)
Sodium: 136 mmol/L (ref 135–145)

## 2021-03-01 MED ORDER — CEFDINIR 300 MG PO CAPS: 300.0000 mg | ORAL_CAPSULE | Freq: Every day | ORAL | 0 refills | Status: AC

## 2021-03-01 MED ORDER — CEFDINIR 300 MG PO CAPS
300.0000 mg | ORAL_CAPSULE | Freq: Once | ORAL | Status: AC
  Administered 2021-03-01: 300 mg via ORAL
  Filled 2021-03-01: qty 1

## 2021-03-01 NOTE — ED Provider Notes (Signed)
[facility] Emergency Department Provider Note ____________________________________________   Event Date/Time   First MD Initiated Contact with Patient 03/01/21 1414     (approximate)  I have reviewed the triage vital signs and the nursing notes.  HISTORY  Chief Complaint Facial Swelling   HPI Larry Callahan is a 65 y.o. malewho presents to the ED for evaluation of arm and face swelling.   Chart review indicates reduced ejection fraction, COPD, CKD, atrial fibrillation on Eliquis and obesity.  Patient presents to the ED for evaluation of right arm erythema, swelling, pruritus and pain.  Initially reports concern to me that he recently initiated inhaler for COPD because of this.  Also was reportedly complaining of left eyelid swelling to triage, but he does not tell me this and I see no indication of this.  He reports 2-3 days of increasing swelling, pain and firmness to the skin of his right lateral arm around his elbow.  Reports generalized weakness without fever, syncopal episodes, falls or trauma.  Reporting 4/10 intensity pain, burning and nonradiating.  Past Medical History:  Diagnosis Date   Allergy    Chronic combined systolic (congestive) and diastolic (congestive) heart failure ([place])    a. 04/2019 Echo: EF 45-50%; b. 02/2019 Echo: EF 55-60%; c. 09/2020 Echo: EF 35-40%, glob HK. Nl RV size/fxn. Mild MR.   CKD (chronic kidney disease), stage III (HCC)    COPD (chronic obstructive pulmonary disease) ([place])    Coronary artery disease    a. 2011 Cath: nonobs dzs; b. 09/2020 MV: EF 38%. No signif ischemia. ? inf MI vs diaph attenuation. GI uptake artifact.   Diabetes mellitus without complication ([place])    GERD (gastroesophageal reflux disease)    Hyperlipidemia    Hypertension    Morbid obesity ([place])    NICM (nonischemic cardiomyopathy) ([place])    a. 04/2019 Echo: EF 45-50%; b. 02/2019 Echo: EF 55-60%; c. 09/2020 Echo: EF 35-40%, glob HK; d. 09/2020  MV: No ischemia. ? inf infarct vs attenuation.   OSA (obstructive sleep apnea)     Patient Active Problem List   Diagnosis Date Noted   COPD with acute exacerbation ([place]) 01/29/2021   Atrial flutter ([place]) 01/07/2021   Syncope and collapse 11/16/2020   Demand ischemia ([place])    Influenza A 10/30/2020   HLD (hyperlipidemia) 10/06/2020   History of CVA (cerebrovascular accident) 09/25/2020   Gastroesophageal reflux disease without esophagitis 04/19/2020   Pneumonia due to COVID-19 virus 01/30/2020   Elevated troponin I level 01/29/2020   Suspected COVID-19 virus infection 01/29/2020   Lightheadedness 01/29/2020   Hematemesis 01/09/2020   Hospital discharge follow-up 11/26/2019   Generalized weakness 08/08/2019   Obesity (BMI 30-39.9) 08/08/2019   Acute right hip pain 06/17/2019   Inability to ambulate due to hip 06/17/2019   CKD stage 3 due to type 2 diabetes mellitus (HCC)    Hypervolemia    Type 2 diabetes mellitus with stage 3b chronic kidney disease, with long-term current use of insulin ([place])    Hypomagnesemia    Full code status 05/20/2019   Pleuritic chest pain    Multifocal pneumonia 05/19/2019   Acute kidney injury superimposed on CKD 3b ([place]) 05/09/2019   Hemoptysis 04/09/2019   Shortness of breath 03/26/2019   Uncontrolled type 2 diabetes mellitus with insulin therapy ([place]) 03/12/2019   Acute on chronic heart failure with preserved ejection fraction (HFpEF) (HCC)    Stage 3b chronic kidney disease (HCC)    Chronic systolic CHF (  congestive heart failure) ([place]) 54/65/0354   Diastolic dysfunction 65/68/1275   Iron deficiency anemia 12/31/2018   Atopic dermatitis 11/24/2018   Screening for colon cancer 11/06/2017   Uncontrolled type 2 diabetes mellitus with hyperglycemia ([place]) 11/06/2017   Hidradenitis 06/09/2017   Atherosclerotic heart disease of native coronary artery without angina pectoris 06/09/2017   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  06/09/2017   Obstructive sleep apnea, adult 06/09/2017   Morbid (severe) obesity due to excess calories ([place]) 06/09/2017   Cigarette nicotine dependence with nicotine-induced disorder 06/09/2017   Diabetic polyneuropathy associated with type 2 diabetes mellitus ([place]) 06/09/2017   Allergic rhinitis due to pollen 06/09/2017   Mixed hyperlipidemia 06/09/2017   COPD (chronic obstructive pulmonary disease) ([place]) 06/09/2017   Dyspnea on exertion 06/09/2017   Wheezing 06/09/2017   Dysuria 06/09/2017   Snoring 06/09/2017   Essential hypertension 06/09/2017   Personal history of other malignant neoplasm of kidney 06/09/2017   Pain in right hip 06/09/2017   Impacted cerumen, bilateral 06/09/2017   Secondary hyperparathyroidism, not elsewhere classified ([place]) 06/09/2017   Type 2 diabetes mellitus with hyperglycemia ([place]) 06/09/2017   Tinea corporis 06/09/2017    Past Surgical History:  Procedure Laterality Date   BACK SURGERY     CARDIAC CATHETERIZATION     CHOLECYSTECTOMY     COLONOSCOPY WITH PROPOFOL N/A 04/12/2019   Procedure: COLONOSCOPY WITH PROPOFOL;  Surgeon: [name], MD;  Location: [facility] ENDOSCOPY;  Service: Gastroenterology;  Laterality: N/A;   ESOPHAGOGASTRODUODENOSCOPY N/A 04/12/2019   Procedure: ESOPHAGOGASTRODUODENOSCOPY (EGD);  Surgeon: [name], MD;  Location: [facility] ENDOSCOPY;  Service: Gastroenterology;  Laterality: N/A;   ESOPHAGOGASTRODUODENOSCOPY (EGD) WITH PROPOFOL N/A 11/21/2019   Procedure: ESOPHAGOGASTRODUODENOSCOPY (EGD) WITH PROPOFOL;  Surgeon: [name], MD;  Location: [facility] ENDOSCOPY;  Service: Gastroenterology;  Laterality: N/A;   FLEXIBLE BRONCHOSCOPY Bilateral 05/17/2019   Procedure: FLEXIBLE BRONCHOSCOPY;  Surgeon: [name], MD;  Location: [name] ORS;  Service: Pulmonary;  Laterality: Bilateral;   left arm surgery     nephrectomy Left    PARATHYROIDECTOMY     RIGHT HEART CATH N/A 03/29/2019   Procedure: RIGHT HEART CATH;   Surgeon: [name], MD;  Location: [place] CV LAB;  Service: Cardiovascular;  Laterality: N/A;    Prior to Admission medications   Medication Sig Start Date End Date Taking? Authorizing Provider  cefdinir (OMNICEF) 300 MG capsule Take 1 capsule (300 mg total) by mouth daily for 7 days. 03/01/21 03/08/21 Yes Vladimir Crofts, MD  albuterol (VENTOLIN HFA) 108 (90 Base) MCG/ACT inhaler Inhale 2 puffs into the lungs every 6 (six) hours as needed for wheezing or shortness of breath. 08/31/20   Lavera Guise, MD  amLODipine (NORVASC) 5 MG tablet Take 1 tablet (5 mg total) by mouth daily. 02/23/21   [name], NP  apixaban (ELIQUIS) 2.5 MG TABS tablet Take 1 tablet (2.5 mg total) by mouth 2 (two) times daily. 01/07/21   Arta Silence, MD  Blood Glucose Calibration (TRUE METRIX LEVEL 1) Low SOLN Use as directed 04/04/18   [name], NP  Blood Glucose Monitoring Suppl (TRUE METRIX AIR GLUCOSE METER) DEVI 1 Device by Does not apply route 2 (two) times daily. 04/13/18   [name], NP  Budeson-Glycopyrrol-Formoterol (BREZTRI AEROSPHERE) 160-9-4.8 MCG/ACT AERO Inhale 2 puffs into the lungs in the morning and at bedtime. 02/23/21   [name], NP  carvedilol (COREG) 12.5 MG tablet Take 1 tablet (12.5 mg total) by mouth 2 (  two) times daily. 01/07/21 01/07/22  Arta Silence, MD  cetirizine (ZYRTEC) 10 MG tablet Take 10 mg by mouth daily.    [provider]  escitalopram (LEXAPRO) 10 MG tablet Take one tab po qd for anxiety with food 02/23/21   [name], NP  furosemide (LASIX) 40 MG tablet Take one to tabs once or twice days for fluid // swelling 02/23/21   [name], NP  gabapentin (NEURONTIN) 100 MG capsule Take 1 capsule (100 mg total) by mouth in the morning, at noon, and at bedtime. 02/23/21   [name], NP  glimepiride (AMARYL) 2 MG tablet Take one tab po qd with supper 02/23/21   [name], NP  glucose blood (TRUE METRIX BLOOD  GLUCOSE TEST) test strip Use as instructed twice daily diag E11.65 07/30/19   [name], NP  hydrALAZINE (APRESOLINE) 100 MG tablet Take 1 tablet (100 mg total) by mouth 3 (three) times daily. 02/23/21   [name], NP  insulin glargine, 1 Unit Dial, (TOUJEO) 300 UNIT/ML Solostar Pen Inject 26 Units into the skin daily. 02/23/21   [name], NP  ipratropium-albuterol (DUONEB) 0.5-2.5 (3) MG/3ML SOLN Take 3 mLs by nebulization every 6 (six) hours as needed. 10/11/20   Lavera Guise, MD  isosorbide mononitrate (IMDUR) 30 MG 24 hr tablet Take 1 tablet (30 mg total) by mouth 2 (two) times daily. 10/08/20   Jennye Boroughs, MD  meclizine (ANTIVERT) 25 MG tablet Take 1 tablet (25 mg total) by mouth 3 (three) times daily as needed for dizziness or nausea. 11/25/20   Paulette Blanch, MD  NOVOFINE PEN NEEDLE 32G X 6 MM MISC Use as directed 10/15/20   Lavera Guise, MD  omeprazole (PRILOSEC) 40 MG capsule Take 1 capsule (40 mg total) by mouth in the morning and at bedtime. 01/26/21   Lavera Guise, MD  potassium chloride SA (KLOR-CON) 20 MEQ tablet Take 1 tablet (20 mEq total) by mouth 2 (two) times daily. 02/23/21   [name], NP  rosuvastatin (CRESTOR) 10 MG tablet Take 1 tablet (10 mg total) by mouth at bedtime. 02/23/21   [name], NP  TRUEplus Lancets 33G MISC Use as directed twice daily diag E11.65 07/30/19   [name], NP  vitamin B-12 1000 MCG tablet Take 1 tablet (1,000 mcg total) by mouth daily. 01/16/21   Fritzi Mandes, MD    Allergies Penicillins  Family History  Problem Relation Age of Onset   Diabetes Mother        2003   Lung cancer Father    Diabetes Sister    Hypertension Sister    Heart disease Sister    Cancer Sister    Bone cancer Brother     Social History Social History   Tobacco Use   Smoking status: Former    Types: Cigarettes   Smokeless tobacco: Former    Types: Nurse, children's Use: Never used  Substance Use Topics   Alcohol  use: No   Drug use: No    Review of Systems  Constitutional: No fever/chills.  Positive generalized weakness Eyes: No visual changes. ENT: No sore throat. Cardiovascular: Denies chest pain. Respiratory: Denies shortness of breath. Gastrointestinal: No abdominal pain.  No nausea, no vomiting.  No diarrhea.  No constipation. Genitourinary: Negative for dysuria. Musculoskeletal: Negative for back pain. Skin: Positive for atraumatic rash to the right lateral arm. Neurological: Negative for headaches, focal weakness or numbness.  ____________________________________________  PHYSICAL EXAM:  VITAL SIGNS: Vitals:   03/01/21 1123  BP: 128/68  Pulse: (!) 107  Resp: 20  Temp: (!) 97.4 F (36.3 C)  SpO2: 95%    Constitutional: Alert and oriented. Well appearing and in no acute distress. Eyes: Conjunctivae are normal. PERRL. EOMI. Head: Atraumatic. Nose: No congestion/rhinnorhea. Mouth/Throat: Mucous membranes are moist.  Oropharynx non-erythematous. Neck: No stridor. No cervical spine tenderness to palpation. Cardiovascular: Normal rate, regular rhythm. Grossly normal heart sounds.  Good peripheral circulation. Respiratory: Normal respiratory effort.  No retractions. Lungs CTAB. Gastrointestinal: Soft , nondistended, nontender to palpation. No CVA tenderness. Musculoskeletal: No lower extremity tenderness nor edema.  No joint effusions.  Freely moving his right upper extremity, flexing and ranging his right shoulder and right elbow without pain with ROM. Large patch of erythema and induration without focal area of fluctuance or coming to ahead to the lateral aspect of the right mid arm, overlying his elbow.  No signs of joint effusion.  Erythematous patches about 8 x 4 inches. Neurologic:  Normal speech and language. No gross focal neurologic deficits are appreciated. No gait instability noted. Skin:  Skin is warm, dry and intact. Psychiatric: Mood and affect are normal. Speech  and behavior are normal. ____________________________________________   LABS (all labs ordered are listed, but only abnormal results are displayed)  Labs Reviewed  CBC - Abnormal; Notable for the following components:      Result Value   RBC 3.83 (*)    Hemoglobin 10.5 (*)    HCT 32.5 (*)    All other components within normal limits  BASIC METABOLIC PANEL - Abnormal; Notable for the following components:   Glucose, Bld 240 (*)    BUN 38 (*)    Creatinine, Ser 2.45 (*)    Calcium 8.1 (*)    GFR, Estimated 28 (*)    All other components within normal limits   ____________________________________________  12 Lead EKG   ____________________________________________  RADIOLOGY  ED MD interpretation: I review superficial ultrasound to the affected area with cobblestoning, subcutaneous edema without definite fluid collection.  Official radiology report(s): No results found.  ____________________________________________   PROCEDURES and INTERVENTIONS  Procedure(s) performed (including Critical Care):  Procedures  Medications  cefdinir (OMNICEF) capsule 300 mg (has no administration in time range)    ____________________________________________   MDM / ED COURSE   65 year old male presents to the ED with evidence of cellulitis to the right upper extremity, amenable to trial of outpatient management.  Noted to be tachycardic in triage, but no other sepsis criteria.  Not tachycardic during my evaluation on physical exam.  No evidence of septic joint or acute trauma.  No neurologic or vascular deficits.  Blood work with his CKD at baseline and without significant acute features.  Superficial ultrasound of the area without definite fluid collection and shows stigmata of cellulitis.  We will start renal dosing of cefdinir and discharged with return precautions.     ____________________________________________   FINAL CLINICAL IMPRESSION(S) / ED DIAGNOSES  Final  diagnoses:  Cellulitis     ED Discharge Orders          Ordered    cefdinir (OMNICEF) 300 MG capsule  Daily        03/01/21 1443             Ahnna Dungan   Note:  This document was prepared using Systems analyst and may include unintentional dictation errors.    Vladimir Crofts, MD 03/01/21 1455

## 2021-03-01 NOTE — Discharge Instructions (Signed)
You are being discharged with a prescription for antibiotics to treat a skin infection  Take this antibiotic once daily for the next 1 week.  We gave you the dose for today already, pick up this prescription and start taking tomorrow, Tuesday.  If you develop any further worsening symptoms despite this, please return to the ED.

## 2021-03-01 NOTE — Telephone Encounter (Signed)
Pt wants to know side effects of new inhaler breztrl, right arm is swollen, eyes are swollen, pt is also itchy and scratching. Spoke to Pettit, she said that it was probably an allergic reaction and that he can go to an urgent care, pt refused stating he did not have a ride. Alyssa recommended he go to the ER and he said ok. Pt has already stopped med and said he was going to use his old inhalers again which Alyssa agreed with. When asked if it felt like his throat was swelling he said no. Pt agreed to go to ER.

## 2021-03-01 NOTE — ED Triage Notes (Signed)
Pt to ED [facility] from home for left eye swelling that started when he began taking new inhaler (no swelling noted). Also reports redness and swelling to right upper arm that MD told him could be from reaction to inhaler being used.  Swelling and redness noted to right upper arm . NAD noted

## 2021-03-01 NOTE — ED Provider Notes (Signed)
Emergency Medicine Provider Triage Evaluation Note  Larry Callahan , a 66 y.o. male  was evaluated in triage.  Pt complains of cellulitis to the right arm, eye swelling due to his new inhaler.  Review of Systems  Positive: Right arm swelling and redness, redness above the eyes Negative: No fever, chills, chest pain or shortness of breath  Physical Exam  BP 128/68   Pulse (!) 107   Temp (!) 97.4 F (36.3 C) (Oral)   Resp 20   Ht 5\' 11"  (1.803 m)   Wt 122.5 kg   SpO2 95%   BMI 37.66 kg/m  Gen:   Awake, no distress able to answer all questions Resp:  Normal effort lungs clear to auscultation MSK:   Moves extremities without difficulty, full range of motion right elbow, large cellulitis noted Other:  Patient appears to be stable  Medical Decision Making  Medically screening exam initiated at 1:49 PM.  Appropriate orders placed.  Larry Callahan was informed that the remainder of the evaluation will be completed by another provider, this initial triage assessment does not replace that evaluation, and the importance of remaining in the ED until their evaluation is complete.  Patient appears to be stable on emeses screening.  No difficulty breathing.  Lungs clear to auscultation.  Will order ultrasound right upper extremity due to the amount of redness and swelling.   Versie Starks, PA-C 03/01/21 1351    Lavonia Drafts, MD 03/01/21 1432

## 2021-03-02 ENCOUNTER — Inpatient Hospital Stay
Admission: EM | Admit: 2021-03-02 | Discharge: 2021-03-07 | DRG: 603 | Disposition: A | Discharge status: Home Health | Payer: Medicare PPO | Attending: Hospitalist | Admitting: Hospitalist

## 2021-03-02 ENCOUNTER — Emergency Department: Payer: Medicare PPO

## 2021-03-02 ENCOUNTER — Other Ambulatory Visit: Payer: Self-pay

## 2021-03-02 DIAGNOSIS — Z8616 Personal history of COVID-19: Secondary | ICD-10-CM

## 2021-03-02 DIAGNOSIS — I10 Essential (primary) hypertension: Secondary | ICD-10-CM | POA: Diagnosis present

## 2021-03-02 DIAGNOSIS — J438 Other emphysema: Secondary | ICD-10-CM | POA: Diagnosis not present

## 2021-03-02 DIAGNOSIS — N1832 Chronic kidney disease, stage 3b: Secondary | ICD-10-CM | POA: Diagnosis present

## 2021-03-02 DIAGNOSIS — Z833 Family history of diabetes mellitus: Secondary | ICD-10-CM

## 2021-03-02 DIAGNOSIS — I5022 Chronic systolic (congestive) heart failure: Secondary | ICD-10-CM | POA: Diagnosis not present

## 2021-03-02 DIAGNOSIS — E119 Type 2 diabetes mellitus without complications: Secondary | ICD-10-CM

## 2021-03-02 DIAGNOSIS — E1152 Type 2 diabetes mellitus with diabetic peripheral angiopathy with gangrene: Secondary | ICD-10-CM | POA: Diagnosis present

## 2021-03-02 DIAGNOSIS — W57XXXA Bitten or stung by nonvenomous insect and other nonvenomous arthropods, initial encounter: Secondary | ICD-10-CM | POA: Diagnosis present

## 2021-03-02 DIAGNOSIS — I130 Hypertensive heart and chronic kidney disease with heart failure and stage 1 through stage 4 chronic kidney disease, or unspecified chronic kidney disease: Secondary | ICD-10-CM | POA: Diagnosis present

## 2021-03-02 DIAGNOSIS — N179 Acute kidney failure, unspecified: Secondary | ICD-10-CM | POA: Diagnosis present

## 2021-03-02 DIAGNOSIS — L03113 Cellulitis of right upper limb: Secondary | ICD-10-CM | POA: Diagnosis present

## 2021-03-02 DIAGNOSIS — F32A Depression, unspecified: Secondary | ICD-10-CM | POA: Diagnosis present

## 2021-03-02 DIAGNOSIS — Z79899 Other long term (current) drug therapy: Secondary | ICD-10-CM | POA: Diagnosis not present

## 2021-03-02 DIAGNOSIS — I5042 Chronic combined systolic (congestive) and diastolic (congestive) heart failure: Secondary | ICD-10-CM | POA: Diagnosis present

## 2021-03-02 DIAGNOSIS — Z9049 Acquired absence of other specified parts of digestive tract: Secondary | ICD-10-CM | POA: Diagnosis not present

## 2021-03-02 DIAGNOSIS — I4892 Unspecified atrial flutter: Secondary | ICD-10-CM | POA: Diagnosis present

## 2021-03-02 DIAGNOSIS — Z7901 Long term (current) use of anticoagulants: Secondary | ICD-10-CM | POA: Diagnosis not present

## 2021-03-02 DIAGNOSIS — Z801 Family history of malignant neoplasm of trachea, bronchus and lung: Secondary | ICD-10-CM

## 2021-03-02 DIAGNOSIS — Z880 Allergy status to penicillin: Secondary | ICD-10-CM

## 2021-03-02 DIAGNOSIS — E669 Obesity, unspecified: Secondary | ICD-10-CM | POA: Diagnosis present

## 2021-03-02 DIAGNOSIS — G4733 Obstructive sleep apnea (adult) (pediatric): Secondary | ICD-10-CM | POA: Diagnosis present

## 2021-03-02 DIAGNOSIS — Z6837 Body mass index (BMI) 37.0-37.9, adult: Secondary | ICD-10-CM

## 2021-03-02 DIAGNOSIS — R339 Retention of urine, unspecified: Secondary | ICD-10-CM | POA: Diagnosis present

## 2021-03-02 DIAGNOSIS — I2510 Atherosclerotic heart disease of native coronary artery without angina pectoris: Secondary | ICD-10-CM | POA: Diagnosis present

## 2021-03-02 DIAGNOSIS — I4891 Unspecified atrial fibrillation: Secondary | ICD-10-CM | POA: Diagnosis present

## 2021-03-02 DIAGNOSIS — Z7984 Long term (current) use of oral hypoglycemic drugs: Secondary | ICD-10-CM

## 2021-03-02 DIAGNOSIS — Z794 Long term (current) use of insulin: Secondary | ICD-10-CM

## 2021-03-02 DIAGNOSIS — E782 Mixed hyperlipidemia: Secondary | ICD-10-CM | POA: Diagnosis present

## 2021-03-02 DIAGNOSIS — Z87891 Personal history of nicotine dependence: Secondary | ICD-10-CM

## 2021-03-02 DIAGNOSIS — Z905 Acquired absence of kidney: Secondary | ICD-10-CM | POA: Diagnosis not present

## 2021-03-02 DIAGNOSIS — E1122 Type 2 diabetes mellitus with diabetic chronic kidney disease: Secondary | ICD-10-CM | POA: Diagnosis present

## 2021-03-02 DIAGNOSIS — J449 Chronic obstructive pulmonary disease, unspecified: Secondary | ICD-10-CM | POA: Diagnosis present

## 2021-03-02 DIAGNOSIS — I483 Typical atrial flutter: Secondary | ICD-10-CM | POA: Diagnosis not present

## 2021-03-02 DIAGNOSIS — E785 Hyperlipidemia, unspecified: Secondary | ICD-10-CM | POA: Diagnosis present

## 2021-03-02 DIAGNOSIS — R000 Tachycardia, unspecified: Secondary | ICD-10-CM | POA: Diagnosis present

## 2021-03-02 DIAGNOSIS — Z8673 Personal history of transient ischemic attack (TIA), and cerebral infarction without residual deficits: Secondary | ICD-10-CM | POA: Diagnosis not present

## 2021-03-02 DIAGNOSIS — I739 Peripheral vascular disease, unspecified: Secondary | ICD-10-CM | POA: Diagnosis not present

## 2021-03-02 DIAGNOSIS — R300 Dysuria: Secondary | ICD-10-CM | POA: Diagnosis present

## 2021-03-02 DIAGNOSIS — K219 Gastro-esophageal reflux disease without esophagitis: Secondary | ICD-10-CM | POA: Diagnosis present

## 2021-03-02 DIAGNOSIS — Z8249 Family history of ischemic heart disease and other diseases of the circulatory system: Secondary | ICD-10-CM

## 2021-03-02 DIAGNOSIS — N189 Chronic kidney disease, unspecified: Secondary | ICD-10-CM | POA: Diagnosis present

## 2021-03-02 LAB — BASIC METABOLIC PANEL
Anion gap: 9 (ref 5–15)
BUN: 50 mg/dL — ABNORMAL HIGH (ref 8–23)
CO2: 21 mmol/L — ABNORMAL LOW (ref 22–32)
Calcium: 8.6 mg/dL — ABNORMAL LOW (ref 8.9–10.3)
Chloride: 106 mmol/L (ref 98–111)
Creatinine, Ser: 2.82 mg/dL — ABNORMAL HIGH (ref 0.61–1.24)
GFR, Estimated: 24 mL/min — ABNORMAL LOW (ref >60)
Glucose, Bld: 98 mg/dL (ref 70–99)
Potassium: 4.6 mmol/L (ref 3.5–5.1)
Sodium: 136 mmol/L (ref 135–145)

## 2021-03-02 LAB — URINALYSIS, COMPLETE (UACMP) WITH MICROSCOPIC
Bacteria, UA: NONE SEEN
Bilirubin Urine: NEGATIVE
Glucose, UA: NEGATIVE mg/dL
Hgb urine dipstick: NEGATIVE
Ketones, ur: NEGATIVE mg/dL
Leukocytes,Ua: NEGATIVE
Nitrite: NEGATIVE
Protein, ur: >300 mg/dL — AB
Specific Gravity, Urine: 1.02 (ref 1.005–1.030)
pH: 5.5 (ref 5.0–8.0)

## 2021-03-02 LAB — LACTIC ACID, PLASMA: Lactic Acid, Venous: 0.9 mmol/L (ref 0.5–1.9)

## 2021-03-02 LAB — CBC
HCT: 30.3 % — ABNORMAL LOW (ref 39.0–52.0)
Hemoglobin: 10 g/dL — ABNORMAL LOW (ref 13.0–17.0)
MCH: 27.3 pg (ref 26.0–34.0)
MCHC: 33 g/dL (ref 30.0–36.0)
MCV: 82.8 fL (ref 80.0–100.0)
Platelets: 182 10*3/uL (ref 150–400)
RBC: 3.66 MIL/uL — ABNORMAL LOW (ref 4.22–5.81)
RDW: 15 % (ref 11.5–15.5)
WBC: 7.6 10*3/uL (ref 4.0–10.5)
nRBC: 0 % (ref 0.0–0.2)

## 2021-03-02 LAB — TROPONIN I (HIGH SENSITIVITY): Troponin I (High Sensitivity): 17 ng/L (ref <18)

## 2021-03-02 MED ORDER — HYDROCODONE-ACETAMINOPHEN 5-325 MG PO TABS: 1.0000 | ORAL_TABLET | ORAL | Status: DC | PRN

## 2021-03-02 MED ORDER — SODIUM CHLORIDE 0.9 % IV BOLUS
500.0000 mL | Freq: Once | INTRAVENOUS | Status: AC
  Administered 2021-03-02: 500 mL via INTRAVENOUS

## 2021-03-02 MED ORDER — IPRATROPIUM-ALBUTEROL 0.5-2.5 (3) MG/3ML IN SOLN
3.0000 mL | Freq: Four times a day (QID) | RESPIRATORY_TRACT | Status: DC | PRN
  Administered 2021-03-05 – 2021-03-07 (×5): 3 mL via RESPIRATORY_TRACT
  Filled 2021-03-02 (×4): qty 3

## 2021-03-02 MED ORDER — ISOSORBIDE MONONITRATE ER 30 MG PO TB24
30.0000 mg | ORAL_TABLET | Freq: Two times a day (BID) | ORAL | Status: DC
  Administered 2021-03-03 – 2021-03-07 (×10): 30 mg via ORAL
  Filled 2021-03-02 (×10): qty 1

## 2021-03-02 MED ORDER — HYDRALAZINE HCL 50 MG PO TABS
100.0000 mg | ORAL_TABLET | Freq: Three times a day (TID) | ORAL | Status: DC
  Administered 2021-03-03 – 2021-03-07 (×14): 100 mg via ORAL
  Filled 2021-03-02 (×14): qty 2

## 2021-03-02 MED ORDER — INSULIN ASPART 100 UNIT/ML IJ SOLN
0.0000 [IU] | Freq: Three times a day (TID) | INTRAMUSCULAR | Status: DC
  Administered 2021-03-03: 3 [IU] via SUBCUTANEOUS
  Administered 2021-03-03: 7 [IU] via SUBCUTANEOUS
  Administered 2021-03-04 (×3): 4 [IU] via SUBCUTANEOUS
  Administered 2021-03-05: 3 [IU] via SUBCUTANEOUS
  Administered 2021-03-05: 7 [IU] via SUBCUTANEOUS
  Administered 2021-03-06: 3 [IU] via SUBCUTANEOUS
  Administered 2021-03-06 – 2021-03-07 (×2): 4 [IU] via SUBCUTANEOUS
  Filled 2021-03-02 (×9): qty 1

## 2021-03-02 MED ORDER — SODIUM CHLORIDE 0.9 % IV SOLN
2.0000 g | INTRAVENOUS | Status: DC
  Administered 2021-03-02 – 2021-03-05 (×4): 2 g via INTRAVENOUS
  Filled 2021-03-02 (×4): qty 20
  Filled 2021-03-02: qty 2

## 2021-03-02 MED ORDER — CEFTRIAXONE SODIUM 1 G IJ SOLR
1.0000 g | INTRAMUSCULAR | Status: DC
  Filled 2021-03-02: qty 10

## 2021-03-02 MED ORDER — INSULIN ASPART 100 UNIT/ML IJ SOLN: 0.0000 [IU] | Freq: Every day | INTRAMUSCULAR | Status: DC

## 2021-03-02 MED ORDER — ONDANSETRON HCL 4 MG PO TABS: 4.0000 mg | ORAL_TABLET | Freq: Four times a day (QID) | ORAL | Status: DC | PRN

## 2021-03-02 MED ORDER — PANTOPRAZOLE SODIUM 40 MG PO TBEC
40.0000 mg | DELAYED_RELEASE_TABLET | Freq: Every day | ORAL | Status: DC
  Administered 2021-03-03 – 2021-03-07 (×5): 40 mg via ORAL
  Filled 2021-03-02 (×5): qty 1

## 2021-03-02 MED ORDER — ACETAMINOPHEN 650 MG RE SUPP: 650.0000 mg | Freq: Four times a day (QID) | RECTAL | Status: DC | PRN

## 2021-03-02 MED ORDER — AMLODIPINE BESYLATE 5 MG PO TABS
5.0000 mg | ORAL_TABLET | Freq: Every day | ORAL | Status: DC
Start: 2021-03-03 — End: 2021-03-07
  Administered 2021-03-03 – 2021-03-07 (×5): 5 mg via ORAL
  Filled 2021-03-02 (×5): qty 1

## 2021-03-02 MED ORDER — APIXABAN 2.5 MG PO TABS
2.5000 mg | ORAL_TABLET | Freq: Two times a day (BID) | ORAL | Status: DC
  Administered 2021-03-03 – 2021-03-04 (×4): 2.5 mg via ORAL
  Filled 2021-03-02 (×5): qty 1

## 2021-03-02 MED ORDER — ESCITALOPRAM OXALATE 10 MG PO TABS
10.0000 mg | ORAL_TABLET | Freq: Every day | ORAL | Status: DC
  Administered 2021-03-03 – 2021-03-07 (×5): 10 mg via ORAL
  Filled 2021-03-02 (×6): qty 1

## 2021-03-02 MED ORDER — ROSUVASTATIN CALCIUM 10 MG PO TABS
10.0000 mg | ORAL_TABLET | Freq: Every day | ORAL | Status: DC
  Administered 2021-03-03 – 2021-03-06 (×5): 10 mg via ORAL
  Filled 2021-03-02 (×6): qty 1

## 2021-03-02 MED ORDER — GABAPENTIN 100 MG PO CAPS
100.0000 mg | ORAL_CAPSULE | Freq: Three times a day (TID) | ORAL | Status: DC
  Administered 2021-03-03 – 2021-03-07 (×14): 100 mg via ORAL
  Filled 2021-03-02 (×14): qty 1

## 2021-03-02 MED ORDER — ONDANSETRON HCL 4 MG/2ML IJ SOLN: 4.0000 mg | Freq: Four times a day (QID) | INTRAMUSCULAR | Status: DC | PRN

## 2021-03-02 MED ORDER — MECLIZINE HCL 25 MG PO TABS
25.0000 mg | ORAL_TABLET | Freq: Three times a day (TID) | ORAL | Status: DC | PRN
  Filled 2021-03-02: qty 1

## 2021-03-02 MED ORDER — ACETAMINOPHEN 325 MG PO TABS: 650.0000 mg | ORAL_TABLET | Freq: Four times a day (QID) | ORAL | Status: DC | PRN

## 2021-03-02 NOTE — ED Notes (Signed)
Pt away at imaging. Once pt back will complete EKG and bloodwork.

## 2021-03-02 NOTE — ED Notes (Signed)
Pt reports UTI-like symptoms. R arm hot to touch; red; posterior portion of arm swollen; pt reports drainage from site; states site had been bleeding minimally; no drainage currently noted. Hand warm; cap refill <3 sec; radial pulse 1+.

## 2021-03-02 NOTE — ED Notes (Signed)
Bladder Scan 110 ml of urine. He says he is going to try and pee, but he is stating that it burns when he urinates.

## 2021-03-02 NOTE — ED Provider Notes (Signed)
Emergency Medicine Provider Triage Evaluation Note  Larry Callahan , a 66 y.o. male  was evaluated in triage.  Pt complains of worsening right upper arm pain with redness and swelling.  States he was started on medication yesterday but the area has become much worse.  Also cannot urinate since starting the medication yesterday.  Review of Systems  Positive: 's and swelling to the right arm, urinary retention Negative: Denies fever or chills  Physical Exam  BP (!) 153/72   Pulse (!) 103   Temp 98 F (36.7 C)   Resp 18   SpO2 100%  Gen:   Awake, no distress   Resp:  Normal effort  MSK:   Moves extremities without difficulty  Other:  Patient appears to be stable  Medical Decision Making  Medically screening exam initiated at 3:32 PM.  Appropriate orders placed.  Larry Callahan was informed that the remainder of the evaluation will be completed by another provider, this initial triage assessment does not replace that evaluation, and the importance of remaining in the ED until their evaluation is complete.     Versie Starks, PA-C 03/02/21 1533    Duffy Bruce, MD 03/03/21 2258

## 2021-03-02 NOTE — H&P (Signed)
History and Physical    Larry Callahan BEE:[id number] DOB: 08-Sep-1955 DOA: 03/02/2021  PCP: Lavera Guise, MD   Patient coming from: home  I have personally briefly reviewed patient's old medical records in [place]  Chief Complaint: redness and pain right upper arm  HPI: Larry Callahan is a 65 y.o. male with medical history significant for CAD with moderate risk Myoview on 09/2020, CKD 3B, HTN, DM, COPD, OSA on CPAP, systolic heart failure, last EF 35 to 40% 4/22, atrial flutter on Eliquis, who presents to the ED for the second time in 2 days, with pain and redness of the right upper arm.  On his first visit on 9/19 he was discharged on cefdinir however his home health nurse sent him for reevaluation in the ED today due to concern for extension of the redness.  Patient denies fever or chills.  Denies chest pain or shortness of breath.  Did endorse some dysuria and difficulty getting his urine out over the past couple days but that seems to have improved.  Denies cough.  ED course: On arrival, afebrile, BP 153/72, pulse 103, O2 sat 100% on room air Blood work: WBC 7600.  Hemoglobin 10 which is baseline, creatinine 2.82, up from baseline of 1.94, troponin 17, urinalysis unremarkable  EKG, personally viewed and interpreted: Atrial flutter at 81 without ST-T wave changes  Imaging: X-ray of the right elbow: Changes consistent with cellulitis with no bony abnormality Chest x-ray: Mild vascular congestion without focal infiltrate  Patient started on Rocephin.  Hospitalist consulted for admission.  Review of Systems: As per HPI otherwise all other systems on review of systems negative.    Past Medical History:  Diagnosis Date   Allergy    Chronic combined systolic (congestive) and diastolic (congestive) heart failure ([place])    a. 04/2019 Echo: EF 45-50%; b. 02/2019 Echo: EF 55-60%; c. 09/2020 Echo: EF 35-40%, glob HK. Nl RV size/fxn. Mild MR.   CKD (chronic kidney  disease), stage III (HCC)    COPD (chronic obstructive pulmonary disease) ([place])    Coronary artery disease    a. 2011 Cath: nonobs dzs; b. 09/2020 MV: EF 38%. No signif ischemia. ? inf MI vs diaph attenuation. GI uptake artifact.   Diabetes mellitus without complication ([place])    GERD (gastroesophageal reflux disease)    Hyperlipidemia    Hypertension    Morbid obesity ([place])    NICM (nonischemic cardiomyopathy) ([place])    a. 04/2019 Echo: EF 45-50%; b. 02/2019 Echo: EF 55-60%; c. 09/2020 Echo: EF 35-40%, glob HK; d. 09/2020 MV: No ischemia. ? inf infarct vs attenuation.   OSA (obstructive sleep apnea)     Past Surgical History:  Procedure Laterality Date   BACK SURGERY     CARDIAC CATHETERIZATION     CHOLECYSTECTOMY     COLONOSCOPY WITH PROPOFOL N/A 04/12/2019   Procedure: COLONOSCOPY WITH PROPOFOL;  Surgeon: [name], MD;  Location: [facility] ENDOSCOPY;  Service: Gastroenterology;  Laterality: N/A;   ESOPHAGOGASTRODUODENOSCOPY N/A 04/12/2019   Procedure: ESOPHAGOGASTRODUODENOSCOPY (EGD);  Surgeon: [name], MD;  Location: [facility] ENDOSCOPY;  Service: Gastroenterology;  Laterality: N/A;   ESOPHAGOGASTRODUODENOSCOPY (EGD) WITH PROPOFOL N/A 11/21/2019   Procedure: ESOPHAGOGASTRODUODENOSCOPY (EGD) WITH PROPOFOL;  Surgeon: [name], MD;  Location: [facility] ENDOSCOPY;  Service: Gastroenterology;  Laterality: N/A;   FLEXIBLE BRONCHOSCOPY Bilateral 05/17/2019   Procedure: FLEXIBLE BRONCHOSCOPY;  Surgeon: [name], MD;  Location: [name] ORS;  Service: Pulmonary;  Laterality: Bilateral;   left  arm surgery     nephrectomy Left    PARATHYROIDECTOMY     RIGHT HEART CATH N/A 03/29/2019   Procedure: RIGHT HEART CATH;  Surgeon: [name], MD;  Location: [facility] CV LAB;  Service: Cardiovascular;  Laterality: N/A;     reports that he has quit smoking. His smoking use included cigarettes. He has quit using smokeless tobacco.  His smokeless tobacco use included chew. He  reports that he does not drink alcohol and does not use drugs.  Allergies  Allergen Reactions   Penicillins Rash    Family History  Problem Relation Age of Onset   Diabetes Mother        2003   Lung cancer Father    Diabetes Sister    Hypertension Sister    Heart disease Sister    Cancer Sister    Bone cancer Brother       Prior to Admission medications   Medication Sig Start Date End Date Taking? Authorizing Provider  albuterol (VENTOLIN HFA) 108 (90 Base) MCG/ACT inhaler Inhale 2 puffs into the lungs every 6 (six) hours as needed for wheezing or shortness of breath. 08/31/20  Yes Lavera Guise, MD  amLODipine (NORVASC) 5 MG tablet Take 1 tablet (5 mg total) by mouth daily. 02/23/21  Yes Abernathy, Yetta Flock, NP  apixaban (ELIQUIS) 2.5 MG TABS tablet Take 1 tablet (2.5 mg total) by mouth 2 (two) times daily. 01/07/21  Yes Arta Silence, MD  Budeson-Glycopyrrol-Formoterol (BREZTRI AEROSPHERE) 160-9-4.8 MCG/ACT AERO Inhale 2 puffs into the lungs in the morning and at bedtime. 02/23/21  Yes Abernathy, Yetta Flock, NP  carvedilol (COREG) 12.5 MG tablet Take 1 tablet (12.5 mg total) by mouth 2 (two) times daily. 01/07/21 01/07/22 Yes Arta Silence, MD  cefdinir (OMNICEF) 300 MG capsule Take 1 capsule (300 mg total) by mouth daily for 7 days. 03/01/21 03/08/21 Yes Vladimir Crofts, MD  cetirizine (ZYRTEC) 10 MG tablet Take 10 mg by mouth daily.   Yes [provider]  escitalopram (LEXAPRO) 10 MG tablet Take one tab po qd for anxiety with food 02/23/21  Yes Abernathy, Alyssa, NP  gabapentin (NEURONTIN) 100 MG capsule Take 1 capsule (100 mg total) by mouth in the morning, at noon, and at bedtime. 02/23/21  Yes Abernathy, Yetta Flock, NP  glimepiride (AMARYL) 2 MG tablet Take one tab po qd with supper 02/23/21  Yes Abernathy, Alyssa, NP  hydrALAZINE (APRESOLINE) 100 MG tablet Take 1 tablet (100 mg total) by mouth 3 (three) times daily. 02/23/21  Yes Abernathy, Alyssa, NP  insulin glargine, 1 Unit  Dial, (TOUJEO) 300 UNIT/ML Solostar Pen Inject 26 Units into the skin daily. 02/23/21  Yes Abernathy, Alyssa, NP  ipratropium-albuterol (DUONEB) 0.5-2.5 (3) MG/3ML SOLN Take 3 mLs by nebulization every 6 (six) hours as needed. 10/11/20  Yes Lavera Guise, MD  isosorbide mononitrate (IMDUR) 30 MG 24 hr tablet Take 1 tablet (30 mg total) by mouth 2 (two) times daily. 10/08/20  Yes Jennye Boroughs, MD  meclizine (ANTIVERT) 25 MG tablet Take 1 tablet (25 mg total) by mouth 3 (three) times daily as needed for dizziness or nausea. 11/25/20  Yes Paulette Blanch, MD  omeprazole (PRILOSEC) 40 MG capsule Take 1 capsule (40 mg total) by mouth in the morning and at bedtime. 01/26/21  Yes Lavera Guise, MD  potassium chloride SA (KLOR-CON) 20 MEQ tablet Take 1 tablet (20 mEq total) by mouth 2 (two) times daily. 02/23/21  Yes Abernathy, Yetta Flock, NP  rosuvastatin (CRESTOR) 10  MG tablet Take 1 tablet (10 mg total) by mouth at bedtime. 02/23/21  Yes Abernathy, Yetta Flock, NP  vitamin B-12 1000 MCG tablet Take 1 tablet (1,000 mcg total) by mouth daily. 01/16/21  Yes Fritzi Mandes, MD  Blood Glucose Calibration (TRUE METRIX LEVEL 1) Low SOLN Use as directed 04/04/18   [name], NP  Blood Glucose Monitoring Suppl (TRUE METRIX AIR GLUCOSE METER) DEVI 1 Device by Does not apply route 2 (two) times daily. 04/13/18   [name], NP  furosemide (LASIX) 40 MG tablet Take one to tabs once or twice days for fluid // swelling 02/23/21   [name], NP  glucose blood (TRUE METRIX BLOOD GLUCOSE TEST) test strip Use as instructed twice daily diag E11.65 07/30/19   [name], NP  NOVOFINE PEN NEEDLE 32G X 6 MM MISC Use as directed 10/15/20   Lavera Guise, MD  TRUEplus Lancets 33G MISC Use as directed twice daily diag E11.65 07/30/19   [name], NP    Physical Exam: Vitals:   03/02/21 1523  BP: (!) 153/72  Pulse: (!) 103  Resp: 18  Temp: 98 F (36.7 C)  SpO2: 100%     Vitals:   03/02/21 1523  BP: (!)  153/72  Pulse: (!) 103  Resp: 18  Temp: 98 F (36.7 C)  SpO2: 100%      Constitutional: Alert and oriented x 3 . Not in any apparent distress HEENT:      Head: Normocephalic and atraumatic.         Eyes: PERLA, EOMI, Conjunctivae are normal. Sclera is non-icteric.       Mouth/Throat: Mucous membranes are moist.       Neck: Supple with no signs of meningismus. Cardiovascular: Regular rate and rhythm. No murmurs, gallops, or rubs. 2+ symmetrical distal pulses are present . No JVD. No LE edema Respiratory: Respiratory effort normal .Lungs sounds clear bilaterally. No wheezes, crackles, or rhonchi.  Gastrointestinal: Soft, non tender, and non distended with positive bowel sounds.  Genitourinary: No CVA tenderness. Musculoskeletal: Redness extending from right elbow up dorsal aspect of right upper extremity with small punctum with purulent drainage.  Neurovascularly intact neurologic:  Face is symmetric. Moving all extremities. No gross focal neurologic deficits . Skin: Skin is warm, dry.  No rash or ulcers Psychiatric: Mood and affect are normal    Labs on Admission: I have personally reviewed following labs and imaging studies  CBC: Recent Labs  Lab 03/01/21 1125 03/02/21 1525  WBC 9.4 7.6  HGB 10.5* 10.0*  HCT 32.5* 30.3*  MCV 84.9 82.8  PLT 183 616   Basic Metabolic Panel: Recent Labs  Lab 03/01/21 1125 03/02/21 1525  NA 136 136  K 4.0 4.6  CL 103 106  CO2 23 21*  GLUCOSE 240* 98  BUN 38* 50*  CREATININE 2.45* 2.82*  CALCIUM 8.1* 8.6*   GFR: Estimated Creatinine Clearance: 33.9 mL/min (A) (by C-G formula based on SCr of 2.82 mg/dL (H)). Liver Function Tests: No results for input(s): AST, ALT, ALKPHOS, BILITOT, PROT, ALBUMIN in the last 168 hours. No results for input(s): LIPASE, AMYLASE in the last 168 hours. No results for input(s): AMMONIA in the last 168 hours. Coagulation Profile: No results for input(s): INR, PROTIME in the last 168 hours. Cardiac  Enzymes: No results for input(s): CKTOTAL, CKMB, CKMBINDEX, TROPONINI in the last 168 hours. BNP (last 3 results) No results for input(s): PROBNP in the last 8760 hours. HbA1C: No results  for input(s): HGBA1C in the last 72 hours. CBG: No results for input(s): GLUCAP in the last 168 hours. Lipid Profile: No results for input(s): CHOL, HDL, LDLCALC, TRIG, CHOLHDL, LDLDIRECT in the last 72 hours. Thyroid Function Tests: No results for input(s): TSH, T4TOTAL, FREET4, T3FREE, THYROIDAB in the last 72 hours. Anemia Panel: No results for input(s): VITAMINB12, FOLATE, FERRITIN, TIBC, IRON, RETICCTPCT in the last 72 hours. Urine analysis:    Component Value Date/Time   COLORURINE YELLOW 03/02/2021 1525   APPEARANCEUR CLEAR 03/02/2021 1525   APPEARANCEUR Cloudy (A) 11/23/2018 1103   LABSPEC 1.020 03/02/2021 1525   LABSPEC 1.005 03/06/2013 0929   PHURINE 5.5 03/02/2021 1525   GLUCOSEU NEGATIVE 03/02/2021 1525   GLUCOSEU Negative 03/06/2013 0929   HGBUR NEGATIVE 03/02/2021 1525   BILIRUBINUR NEGATIVE 03/02/2021 1525   BILIRUBINUR negative 02/20/2019 1404   BILIRUBINUR Negative 11/23/2018 1103   BILIRUBINUR Negative 03/06/2013 0929   KETONESUR NEGATIVE 03/02/2021 1525   PROTEINUR >300 (A) 03/02/2021 1525   UROBILINOGEN 0.2 02/20/2019 1404   NITRITE NEGATIVE 03/02/2021 1525   LEUKOCYTESUR NEGATIVE 03/02/2021 1525   LEUKOCYTESUR Negative 03/06/2013 0929    Radiological Exams on Admission: DG Elbow Complete Right  Result Date: 03/02/2021 CLINICAL DATA:  Elbow cellulitis EXAM: RIGHT ELBOW - COMPLETE 3+ VIEW COMPARISON:  None. FINDINGS: No acute fracture or dislocation is noted. Minimal elevation of the anterior fat pad is noted which may be related to a small joint effusion. Considerable soft tissue swelling is noted particularly posteriorly and along the medial and lateral aspects of the elbow consistent with the known history of cellulitis. IMPRESSION: Changes consistent with cellulitis.  No acute bony abnormality is noted. Electronically Signed   By: Inez Catalina M.D.   On: 03/02/2021 21:59   DG Chest Portable 1 View  Result Date: 03/02/2021 CLINICAL DATA:  Chest pain EXAM: PORTABLE CHEST 1 VIEW COMPARISON:  02/18/2021 FINDINGS: Cardiac shadow remains enlarged. Lungs are well aerated bilaterally. Mild vascular congestion is seen without focal infiltrate. No bony abnormality is noted. IMPRESSION: Mild vascular congestion without focal infiltrate. Electronically Signed   By: Inez Catalina M.D.   On: 03/02/2021 22:00   [name] RT UPPER EXTREM LTD SOFT TISSUE NON VASCULAR  Result Date: 03/01/2021 CLINICAL DATA:  Posterior right upper extremity swelling and redness EXAM: ULTRASOUND RIGHT UPPER EXTREMITY LIMITED TECHNIQUE: Ultrasound examination of the upper extremity soft tissues was performed in the area of clinical concern. COMPARISON:  None. FINDINGS: Targeted ultrasound was performed of the mid to distal aspect of the posterior right upper arm and medial right forearm at site of patient's clinical concern. Subcutaneous edema and ill-defined fluid is present throughout the superficial soft tissues at these locations. No organized or drainable fluid collections. IMPRESSION: Findings suggestive of cellulitis of the right upper extremity. No organized or drainable fluid collections. Electronically Signed   By: Davina Poke D.O.   On: 03/01/2021 15:16     Assessment/Plan 65 year old male with history of CAD , CKD 3B, HTN, DM, COPD, OSA on CPAP, systolic heart failure, atrial flutter on Eliquis, who presents to the ED for the second time in 2 days, with pain and redness of the right upper arm, worsening in spite of outpatient cefdinir.      Cellulitis of right arm -Continue IV Rocephin - Pain control - Wound care: Patient has small amount of purulent drainage from punctum posterior aspect right upper arm.  Denies knowledge of insect bite or other    Acute kidney injury superimposed on  CKD  3b ([place]) - Creatinine 2.82 above baseline of 1.94 - Oral rehydration and monitor    Chronic systolic CHF (congestive heart failure) ([place]) - Not acutely exacerbated - Echocardiogram 4/22 with EF 35 to 40% - Continue carvedilol, furosemide, Imdur.  Not currently on ACE/ARB likely related to renal function  CAD - History of moderate risk Myoview in April 2022 - Troponin negative and EKG nonacute - Continue statin beta-blockers and antiplatelets    Atrial flutter (HCC)   Chronic anticoagulation - Recently diagnosed in July 2022 - Continue carvedilol - Continue Eliquis  Obstructive sleep apnea, adult - CPAP     COPD - Not acutely exacerbated - Albuterol as needed, home inhalers     Essential hypertension -Continue hydralazine  Diabetes - Sliding scale insulin coverage   DVT prophylaxis: Eliquis  code Status: full code  Family Communication:  none  Disposition Plan: Back to previous home environment Consults called: none  Status:At the time of admission, it appears that the appropriate admission status for this patient is INPATIENT. This is judged to be reasonable and necessary in order to provide the required intensity of service to ensure the patient's safety given the presenting symptoms, physical exam findings, and initial radiographic and laboratory data in the context of their  Comorbid conditions.   Patient requires inpatient status due to high intensity of service, high risk for further deterioration and high frequency of surveillance required.   I certify that at the point of admission it is my clinical judgment that the patient will require inpatient hospital care spanning beyond Catharine MD [facility]     03/02/2021, 10:48 PM

## 2021-03-02 NOTE — ED Notes (Signed)
At bedside to collect updated vital signs. Notified pt moving to C-pod. Next RN given report.

## 2021-03-02 NOTE — ED Notes (Signed)
Pt on phone with family  

## 2021-03-02 NOTE — ED Notes (Signed)
EDP [place] at bedside.  

## 2021-03-02 NOTE — ED Provider Notes (Signed)
[facility] Emergency Department Provider Note  ____________________________________________   Event Date/Time   First MD Initiated Contact with Patient 03/02/21 2100     (approximate)  I have reviewed the triage vital signs and the nursing notes.   HISTORY  Chief Complaint Urinary Retention and Arm Pain    HPI Larry Callahan is a 65 y.o. male with CHF, COPD, CKD, A. fib on Eliquis who comes in with concerns for worsening right arm swelling as well as urinary retention.  Patient was seen here yesterday and diagnosed with cellulitis of the right upper arm.  Patient was amenable to try outpatient management and was just tachycardic but otherwise not septic therefore patient was placed on cefdinir and discharged home.  Since being discharged patient reportedly has not been able to urinate.  However patient states that he has been able to urinate just states that it kind of burns when he tries to urinate.  He also reports a little bit of tingling sensation in his chest but no obvious chest pain.  Patient states that he has taken a dose of the antibiotics yesterday and 1 dose today but states that the swelling has gotten worse on his right arm.  He states that a nurse that helps take care of him stated that it looked a lot worse and he need to come back for IV antibiotics          Past Medical History:  Diagnosis Date   Allergy    Chronic combined systolic (congestive) and diastolic (congestive) heart failure ([place])    a. 04/2019 Echo: EF 45-50%; b. 02/2019 Echo: EF 55-60%; c. 09/2020 Echo: EF 35-40%, glob HK. Nl RV size/fxn. Mild MR.   CKD (chronic kidney disease), stage III (HCC)    COPD (chronic obstructive pulmonary disease) ([place])    Coronary artery disease    a. 2011 Cath: nonobs dzs; b. 09/2020 MV: EF 38%. No signif ischemia. ? inf MI vs diaph attenuation. GI uptake artifact.   Diabetes mellitus without complication ([place])    GERD (gastroesophageal  reflux disease)    Hyperlipidemia    Hypertension    Morbid obesity ([place])    NICM (nonischemic cardiomyopathy) ([place])    a. 04/2019 Echo: EF 45-50%; b. 02/2019 Echo: EF 55-60%; c. 09/2020 Echo: EF 35-40%, glob HK; d. 09/2020 MV: No ischemia. ? inf infarct vs attenuation.   OSA (obstructive sleep apnea)     Patient Active Problem List   Diagnosis Date Noted   COPD with acute exacerbation ([place]) 01/29/2021   Atrial flutter ([place]) 01/07/2021   Syncope and collapse 11/16/2020   Demand ischemia ([place])    Influenza A 10/30/2020   HLD (hyperlipidemia) 10/06/2020   History of CVA (cerebrovascular accident) 09/25/2020   Gastroesophageal reflux disease without esophagitis 04/19/2020   Pneumonia due to COVID-19 virus 01/30/2020   Elevated troponin I level 01/29/2020   Suspected COVID-19 virus infection 01/29/2020   Lightheadedness 01/29/2020   Hematemesis 01/09/2020   Hospital discharge follow-up 11/26/2019   Generalized weakness 08/08/2019   Obesity (BMI 30-39.9) 08/08/2019   Acute right hip pain 06/17/2019   Inability to ambulate due to hip 06/17/2019   CKD stage 3 due to type 2 diabetes mellitus (HCC)    Hypervolemia    Type 2 diabetes mellitus with stage 3b chronic kidney disease, with long-term current use of insulin ([place])    Hypomagnesemia    Full code status 05/20/2019   Pleuritic chest pain    Multifocal pneumonia  05/19/2019   Acute kidney injury superimposed on CKD 3b ([place]) 05/09/2019   Hemoptysis 04/09/2019   Shortness of breath 03/26/2019   Uncontrolled type 2 diabetes mellitus with insulin therapy (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   Obstructive sleep apnea, adult 06/09/2017   Morbid (severe) obesity due to excess calories ([place]) 06/09/2017   Cigarette nicotine dependence with nicotine-induced disorder 06/09/2017   Diabetic polyneuropathy associated with type 2 diabetes mellitus ([place]) 06/09/2017   Allergic rhinitis due to pollen 06/09/2017   Mixed hyperlipidemia 06/09/2017   COPD (chronic obstructive pulmonary disease) ([place]) 06/09/2017   Dyspnea on exertion 06/09/2017   Wheezing 06/09/2017   Dysuria 06/09/2017   Snoring 06/09/2017   Essential hypertension 06/09/2017   Personal history of other malignant neoplasm of kidney 06/09/2017   Pain in right hip 06/09/2017   Impacted cerumen, bilateral 06/09/2017   Secondary hyperparathyroidism, not elsewhere classified ([place]) 06/09/2017   Type 2 diabetes mellitus with hyperglycemia ([place]) 06/09/2017   Tinea corporis 06/09/2017    Past Surgical History:  Procedure Laterality Date   BACK SURGERY     CARDIAC CATHETERIZATION     CHOLECYSTECTOMY     COLONOSCOPY WITH PROPOFOL N/A 04/12/2019   Procedure: COLONOSCOPY WITH PROPOFOL;  Surgeon: [name], MD;  Location[facility] ENDOSCOPY;  Service: Gastroenterology;  Laterality: N/A;   ESOPHAGOGASTRODUODENOSCOPY N/A 04/12/2019   Procedure: ESOPHAGOGASTRODUODENOSCOPY (EGD);  Surgeon: [name], MD;  Location: [facility] ENDOSCOPY;  Service: Gastroenterology;  Laterality: N/A;   ESOPHAGOGASTRODUODENOSCOPY (EGD) WITH PROPOFOL N/A 11/21/2019   Procedure: ESOPHAGOGASTRODUODENOSCOPY (EGD) WITH PROPOFOL;  Surgeon: [name], MD;  Location: [facility] ENDOSCOPY;  Service: Gastroenterology;  Laterality: N/A;   FLEXIBLE BRONCHOSCOPY Bilateral 05/17/2019   Procedure: FLEXIBLE BRONCHOSCOPY;   Surgeon: [name], MD;  Location: [name] ORS;  Service: Pulmonary;  Laterality: Bilateral;   left arm surgery     nephrectomy Left    PARATHYROIDECTOMY     RIGHT HEART CATH N/A 03/29/2019   Procedure: RIGHT HEART CATH;  Surgeon: [name], MD;  Location: [facility] CV LAB;  Service: Cardiovascular;  Laterality: N/A;    Prior to Admission medications   Medication Sig Start Date End Date Taking? Authorizing Provider  albuterol (VENTOLIN HFA) 108 (90 Base) MCG/ACT inhaler Inhale 2 puffs into the lungs every 6 (six) hours as needed for wheezing or shortness of breath. 08/31/20   Lavera Guise, MD  amLODipine (NORVASC) 5 MG tablet Take 1 tablet (5 mg total) by mouth daily. 02/23/21   [name], NP  apixaban (ELIQUIS) 2.5 MG TABS tablet Take 1 tablet (2.5 mg total) by mouth 2 (two) times daily. 01/07/21   Arta Silence, MD  Blood Glucose Calibration (TRUE METRIX LEVEL 1) Low SOLN Use as directed 04/04/18   [name], NP  Blood Glucose Monitoring Suppl (TRUE METRIX AIR GLUCOSE METER) DEVI 1 Device by Does not apply route 2 (two) times daily. 04/13/18   [name], NP  Budeson-Glycopyrrol-Formoterol (  BREZTRI AEROSPHERE) 160-9-4.8 MCG/ACT AERO Inhale 2 puffs into the lungs in the morning and at bedtime. 02/23/21   [name], NP  carvedilol (COREG) 12.5 MG tablet Take 1 tablet (12.5 mg total) by mouth 2 (two) times daily. 01/07/21 01/07/22  Arta Silence, MD  cefdinir (OMNICEF) 300 MG capsule Take 1 capsule (300 mg total) by mouth daily for 7 days. 03/01/21 03/08/21  Vladimir Crofts, MD  cetirizine (ZYRTEC) 10 MG tablet Take 10 mg by mouth daily.    [provider]  escitalopram (LEXAPRO) 10 MG tablet Take one tab po qd for anxiety with food 02/23/21   [name], NP  furosemide (LASIX) 40 MG tablet Take one to tabs once or twice days for fluid // swelling 02/23/21   [name], NP  gabapentin (NEURONTIN) 100 MG capsule Take 1 capsule (100  mg total) by mouth in the morning, at noon, and at bedtime. 02/23/21   [name], NP  glimepiride (AMARYL) 2 MG tablet Take one tab po qd with supper 02/23/21   [name], NP  glucose blood (TRUE METRIX BLOOD GLUCOSE TEST) test strip Use as instructed twice daily diag E11.65 07/30/19   [name], NP  hydrALAZINE (APRESOLINE) 100 MG tablet Take 1 tablet (100 mg total) by mouth 3 (three) times daily. 02/23/21   [name], NP  insulin glargine, 1 Unit Dial, (TOUJEO) 300 UNIT/ML Solostar Pen Inject 26 Units into the skin daily. 02/23/21   [name], NP  ipratropium-albuterol (DUONEB) 0.5-2.5 (3) MG/3ML SOLN Take 3 mLs by nebulization every 6 (six) hours as needed. 10/11/20   Lavera Guise, MD  isosorbide mononitrate (IMDUR) 30 MG 24 hr tablet Take 1 tablet (30 mg total) by mouth 2 (two) times daily. 10/08/20   Jennye Boroughs, MD  meclizine (ANTIVERT) 25 MG tablet Take 1 tablet (25 mg total) by mouth 3 (three) times daily as needed for dizziness or nausea. 11/25/20   Paulette Blanch, MD  NOVOFINE PEN NEEDLE 32G X 6 MM MISC Use as directed 10/15/20   Lavera Guise, MD  omeprazole (PRILOSEC) 40 MG capsule Take 1 capsule (40 mg total) by mouth in the morning and at bedtime. 01/26/21   Lavera Guise, MD  potassium chloride SA (KLOR-CON) 20 MEQ tablet Take 1 tablet (20 mEq total) by mouth 2 (two) times daily. 02/23/21   [name], NP  rosuvastatin (CRESTOR) 10 MG tablet Take 1 tablet (10 mg total) by mouth at bedtime. 02/23/21   [name], NP  TRUEplus Lancets 33G MISC Use as directed twice daily diag E11.65 07/30/19   [name], NP  vitamin B-12 1000 MCG tablet Take 1 tablet (1,000 mcg total) by mouth daily. 01/16/21   Fritzi Mandes, MD    Allergies Penicillins  Family History  Problem Relation Age of Onset   Diabetes Mother        2003   Lung cancer Father    Diabetes Sister    Hypertension Sister    Heart disease Sister    Cancer Sister    Bone cancer  Brother     Social History Social History   Tobacco Use   Smoking status: Former    Types: Cigarettes   Smokeless tobacco: Former    Types: Nurse, children's Use: Never used  Substance Use Topics   Alcohol use: No   Drug use: No      Review of Systems Constitutional: No fever/chills Eyes: No visual changes.  ENT: No sore throat. Cardiovascular: chest tingling  Respiratory: Denies shortness of breath. Gastrointestinal: No abdominal pain.  No nausea, no vomiting.  No diarrhea.  No constipation. Genitourinary: Negative for dysuria. Musculoskeletal: Negative for back pain. Right arm swelling  Skin: Negative for rash. Neurological: Negative for headaches, focal weakness or numbness. All other ROS negative ____________________________________________   PHYSICAL EXAM:  VITAL SIGNS: ED Triage Vitals [03/02/21 1523]  Enc Vitals Group     BP (!) 153/72     Pulse Rate (!) 103     Resp 18     Temp 98 F (36.7 C)     Temp src      SpO2 100 %     Weight      Height      Head Circumference      Peak Flow      Pain Score 9     Pain Loc      Pain Edu?      Excl. in [place]?     Constitutional: Alert and oriented. Well appearing and in no acute distress. Eyes: Conjunctivae are normal. EOMI. Head: Atraumatic. Nose: No congestion/rhinnorhea. Mouth/Throat: Mucous membranes are moist.   Neck: No stridor. Trachea Midline. FROM Cardiovascular: Normal rate, regular rhythm. Grossly normal heart sounds.  Good peripheral circulation. Respiratory: Normal respiratory effort.  No retractions. Lungs CTAB. Gastrointestinal: Soft and nontender. No distention. No abdominal bruits.  Musculoskeletal: Fully able to extend the arm but slightly limited flexion.  He is having a lot of warmth and induration noted on the back of the elbow and into the upper arm.  2+ distal pulse. Neurologic:  Normal speech and language. No gross focal neurologic deficits are appreciated.  Skin:  Skin is  warm, dry and intact. No rash noted. Psychiatric: Mood and affect are normal. Speech and behavior are normal. GU: Deferred   ____________________________________________   LABS (all labs ordered are listed, but only abnormal results are displayed)  Labs Reviewed  URINALYSIS, COMPLETE (UACMP) WITH MICROSCOPIC - Abnormal; Notable for the following components:      Result Value   Protein, ur >300 (*)    All other components within normal limits  CBC - Abnormal; Notable for the following components:   RBC 3.66 (*)    Hemoglobin 10.0 (*)    HCT 30.3 (*)    All other components within normal limits  BASIC METABOLIC PANEL - Abnormal; Notable for the following components:   CO2 21 (*)    BUN 50 (*)    Creatinine, Ser 2.82 (*)    Calcium 8.6 (*)    GFR, Estimated 24 (*)    All other components within normal limits   ____________________________________________   ED ECG REPORT I, Vanessa [place], the attending physician, personally viewed and interpreted this ECG.  Atrial flutter rate of 81, no ST elevation, no T wave versions, normal  ____________________________________________  RADIOLOGY Robert Bellow, personally viewed and evaluated these images (plain radiographs) as part of my medical decision making, as well as reviewing the written report by the radiologist.  ED MD interpretation: No fracture noted  Official radiology report(s): DG Elbow Complete Right  Result Date: 03/02/2021 CLINICAL DATA:  Elbow cellulitis EXAM: RIGHT ELBOW - COMPLETE 3+ VIEW COMPARISON:  None. FINDINGS: No acute fracture or dislocation is noted. Minimal elevation of the anterior fat pad is noted which may be related to a small joint effusion. Considerable soft tissue swelling is noted particularly posteriorly and along the medial and  lateral aspects of the elbow consistent with the known history of cellulitis. IMPRESSION: Changes consistent with cellulitis. No acute bony abnormality is noted.  Electronically Signed   By: Inez Catalina M.D.   On: 03/02/2021 21:59   DG Chest Portable 1 View  Result Date: 03/02/2021 CLINICAL DATA:  Chest pain EXAM: PORTABLE CHEST 1 VIEW COMPARISON:  02/18/2021 FINDINGS: Cardiac shadow remains enlarged. Lungs are well aerated bilaterally. Mild vascular congestion is seen without focal infiltrate. No bony abnormality is noted. IMPRESSION: Mild vascular congestion without focal infiltrate. Electronically Signed   By: Inez Catalina M.D.   On: 03/02/2021 22:00    ____________________________________________   PROCEDURES  Procedure(s) performed (including Critical Care):  Procedures   ____________________________________________   INITIAL IMPRESSION / ASSESSMENT AND PLAN / ED COURSE  Larry Callahan was evaluated in Emergency Department on 03/02/2021 for the symptoms described in the history of present illness. He was evaluated in the context of the global COVID-19 pandemic, which necessitated consideration that the patient might be at risk for infection with the SARS-CoV-2 virus that causes COVID-19. Institutional protocols and algorithms that pertain to the evaluation of patients at risk for COVID-19 are in a state of rapid change based on information released by regulatory bodies including the CDC and federal and state organizations. These policies and algorithms were followed during the patient's care in the ED.    Patient comes in with concerns for right arm worsening swelling and redness.  Patient is on Eliquis unlikely DVT.  X-ray ordered to make sure no evidence of any underlying osteo which I doubt.  Does not seem to be rapidly expanding to suggest necrotizing fasciitis.  Given failing outpatient treatment we will discussed with the hospital team for admission.  There was concern for some retention but bladder scan here is only 100 cc and patient's been able to give a urine without evidence of UTI.  He does report a little bit of tingling in his  chest and wrist suspect is just referred pain but will add on EKG, cardiac markers to make sure no evidence of ACS  At this time patient is not septic.  He has elevated heart rate but does not meet any other sirs criteria therefore sepsis alert was not called     ____________________________________________   FINAL CLINICAL IMPRESSION(S) / ED DIAGNOSES   Final diagnoses:  Cellulitis of right upper extremity      MEDICATIONS GIVEN DURING THIS VISIT:  Medications  cefTRIAXone (ROCEPHIN) 2 g in sodium chloride 0.9 % 100 mL IVPB (2 g Intravenous New Bag/Given 03/02/21 2221)  sodium chloride 0.9 % bolus 500 mL (500 mLs Intravenous New Bag/Given 03/02/21 2220)     ED Discharge Orders     None        Note:  This document was prepared using Dragon voice recognition software and may include unintentional dictation errors.    Vanessa [place], MD 03/02/21 2228

## 2021-03-02 NOTE — ED Triage Notes (Addendum)
Pt comes with c/o urinary retention. Pt states this started today. Pt states pain and swelling to right arm as well.  Pt denies any injuries.  Pt states pain and burning when urinating

## 2021-03-02 NOTE — ED Notes (Signed)
No portable EKG machine currently available. Will complete EKG once one available.

## 2021-03-03 ENCOUNTER — Encounter: Payer: Self-pay | Admitting: Internal Medicine

## 2021-03-03 DIAGNOSIS — I483 Typical atrial flutter: Secondary | ICD-10-CM

## 2021-03-03 DIAGNOSIS — N1832 Chronic kidney disease, stage 3b: Secondary | ICD-10-CM

## 2021-03-03 DIAGNOSIS — I5022 Chronic systolic (congestive) heart failure: Secondary | ICD-10-CM

## 2021-03-03 DIAGNOSIS — J438 Other emphysema: Secondary | ICD-10-CM

## 2021-03-03 DIAGNOSIS — G4733 Obstructive sleep apnea (adult) (pediatric): Secondary | ICD-10-CM

## 2021-03-03 DIAGNOSIS — Z794 Long term (current) use of insulin: Secondary | ICD-10-CM

## 2021-03-03 DIAGNOSIS — E1122 Type 2 diabetes mellitus with diabetic chronic kidney disease: Secondary | ICD-10-CM

## 2021-03-03 DIAGNOSIS — I739 Peripheral vascular disease, unspecified: Secondary | ICD-10-CM

## 2021-03-03 LAB — CBG MONITORING, ED
Glucose-Capillary: 117 mg/dL — ABNORMAL HIGH (ref 70–99)
Glucose-Capillary: 131 mg/dL — ABNORMAL HIGH (ref 70–99)
Glucose-Capillary: 238 mg/dL — ABNORMAL HIGH (ref 70–99)
Glucose-Capillary: 92 mg/dL (ref 70–99)

## 2021-03-03 LAB — LACTIC ACID, PLASMA: Lactic Acid, Venous: 0.7 mmol/L (ref 0.5–1.9)

## 2021-03-03 NOTE — ED Notes (Signed)
Patient is resting comfortably on stretcher in hallway at this time. NAD 

## 2021-03-03 NOTE — Plan of Care (Signed)

## 2021-03-03 NOTE — ED Notes (Signed)
Pt eating dinner tray at this time 

## 2021-03-03 NOTE — Progress Notes (Addendum)
PROGRESS NOTE  Larry Callahan    DOB: 1955/12/20, 65 y.o.  MIW:[id number]  PCP: Lavera Guise, MD   Code Status: Full Code   DOA: 03/02/2021   LOS: 1  Brief Narrative of Current Hospitalization  Larry Callahan is a 65 y.o. male with a PMH significant for CAD with moderate risk Myoview on 09/2020, CKD 3B, HTN, DM, COPD, OSA on CPAP, systolic heart failure, last EF 35 to 40% 4/22, atrial flutter on Eliquis. They presented from home to the ED on 03/02/2021 with R arm infection not improving on home PO antibiotics. In the ED, it was found that they had cellulitis of RUE. They were treated with IV Abx.  Patient was admitted to medicine service for further workup and management of cellulitis as well as PAD/cellulitis of foot as outlined in detail below.  03/03/21 -requests to be transferred to [facility] if possible for continued care  Assessment & Plan  Principal Problem:   Cellulitis of right arm Active Problems:   Obstructive sleep apnea, adult   COPD (chronic obstructive pulmonary disease) (HCC)   Essential hypertension   Chronic systolic CHF (congestive heart failure) (HCC)   Acute kidney injury superimposed on CKD 3b (HCC)   Type 2 diabetes mellitus with stage 3b chronic kidney disease, with long-term current use of insulin (HCC)   Atrial flutter (HCC)   Chronic anticoagulation  Cellulitis of R arm. Remains afebrile. Bx Cx NG <12hrs. WBC wnl. - continue CTX - wound care  AKI on CKDIII- Cr elevated to 2.82 yesterday afternoon. - repeat labs today - consider IV fluids after evaluating fluid status  A-flutter- HR stable 85-96 today. - continue eliquis - continue carvedilol  OSA- - continue CPAP nightly  HTN  HFrEF- home medications include: amlodipine 5mg , hydralazine 100mg  TID, imdur 30mg  BID - continue home meds  COPD- stable. home meds include: breztri, albuterol - continue home meds  T2DM- home meds include: glimepiride, 26u glargine daily. Hgb A1c was 6.2 in  July so below goal of 7. 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 No pain with palpation. Requested to be transferred to [place], where he receives his main care, for definitive care if possible but are open to being treated here as well.  - consulted vascular, per daughter request  DVT prophylaxis: apixaban (ELIQUIS) tablet 2.5 mg Start: 03/02/21 2330 apixaban (ELIQUIS) tablet 2.5 mg   Diet:  Diet Orders (From admission, onward)     Start     Ordered   03/02/21 2302  Diet heart healthy/carb modified Room service appropriate? Yes; Fluid consistency: Thin  Diet effective now       Question Answer Comment  Diet-HS Snack? Nothing   Room service appropriate? Yes   Fluid consistency: Thin      03/02/21 2302            Subjective 03/03/21    Pt reports doing alright but does not feel great. He is not in acute pain. Just feels tired. They would like to be transferred to [facility] if possible for definitive care but knows that they are on diversion as of yesterday so are wanting to pursue care here with IV antibiotics and vascular consult.  Disposition Plan & Communication  Status is: Inpatient  Remains inpatient appropriate because:IV treatments appropriate due to intensity of illness or inability to take PO  Dispo: The patient is from: Home              Anticipated d/c is to: Home              Patient currently is not medically stable to d/c.   Difficult to place patient No   Family Communication: daughter at bedside   Consults, Procedures, Significant Events  Consultants:  vascular  Procedures/significant events:  None at this time  Antimicrobials:  Anti-infectives (From admission, onward)    Start     Dose/Rate Route Frequency Ordered Stop    03/03/21 2200  cefTRIAXone (ROCEPHIN) 1 g in sodium chloride 0.9 % 100 mL IVPB        1 g 200 mL/hr over 30 Minutes Intravenous Every 24 hours 03/02/21 2302 03/10/21 2159   03/02/21 2145  cefTRIAXone (ROCEPHIN) 2 g in sodium chloride 0.9 % 100 mL IVPB        2 g 200 mL/hr over 30 Minutes Intravenous Every 24 hours 03/02/21 2131 03/09/21 2144        Objective   Vitals:   03/02/21 2326 03/03/21 0315 03/03/21 0426 03/03/21 0640  BP: (!) 147/91 (!) 86/55 (!) 146/55 (!) 148/65  Pulse: 85 96  85  Resp: 16 20  20   Temp: 98.4 F (36.9 C) 98.6 F (37 C)  98.4 F (36.9 C)  TempSrc: Oral Oral  Oral  SpO2: 96% 94%  95%    Intake/Output Summary (Last 24 hours) at 03/03/2021 0854 Last data filed at 03/03/2021 0033 Gross per 24 hour  Intake 600 ml  Output --  Net 600 ml   There were no vitals filed for this visit.  Patient BMI: There is no height or weight on file to calculate BMI.   Physical Exam: General: awake, alert, NAD HEENT: atraumatic, clear conjunctiva, anicteric sclera, moist mucus membranes, hearing grossly normal Respiratory: normal respiratory effort. Cardiovascular: normal S1/S2, RRR, decreased pedal and tibial pulses. Musky feet. Swelling and erythema non-symmetric worse of left LE. Endorses sensation of feet bilaterally but diminished in comparison to upper legs. Normal ROM of feet/toes. No tenderness to palpation unless squeeze left great toe which I did not Nervous: A&O x4. no gross focal neurologic deficits, normal speech Extremities: moves all equally, normal tone Skin: as described above. Erythema of right arm Psychiatry: normal mood  Labs   I have personally reviewed following labs and imaging studies Admission on 03/02/2021  Component Date Value Ref Range Status   Color, Urine 03/02/2021 YELLOW  YELLOW Final   APPearance 03/02/2021 CLEAR  CLEAR Final   Specific Gravity, Urine 03/02/2021 1.020  1.005 - 1.030 Final   pH 03/02/2021 5.5  5.0 - 8.0 Final    Glucose, UA 03/02/2021 NEGATIVE  NEGATIVE mg/dL Final   Hgb urine dipstick 03/02/2021 NEGATIVE  NEGATIVE Final   Bilirubin Urine 03/02/2021 NEGATIVE  NEGATIVE Final   Ketones, ur 03/02/2021 NEGATIVE  NEGATIVE mg/dL Final   Protein, ur 03/02/2021 >300 (A) NEGATIVE mg/dL Final   Nitrite 03/02/2021 NEGATIVE  NEGATIVE Final   Leukocytes,Ua 03/02/2021 NEGATIVE  NEGATIVE Final   Squamous Epithelial / LPF 03/02/2021 0-5  0 - 5 Final   WBC, UA 03/02/2021 0-5  0 - 5 WBC/hpf Final   RBC / HPF 03/02/2021 0-5  0 - 5 RBC/hpf Final   Bacteria, UA 03/02/2021 NONE SEEN  NONE SEEN Final   Mucus 03/02/2021 PRESENT  Final   Hyaline Casts, UA 03/02/2021 PRESENT   Final   WBC 03/02/2021 7.6  4.0 - 10.5 K/uL Final   RBC 03/02/2021 3.66 (A) 4.22 - 5.81 MIL/uL Final   Hemoglobin 03/02/2021 10.0 (A) 13.0 - 17.0 g/dL Final   HCT 03/02/2021 30.3 (A) 39.0 - 52.0 % Final   MCV 03/02/2021 82.8  80.0 - 100.0 fL Final   MCH 03/02/2021 27.3  26.0 - 34.0 pg Final   MCHC 03/02/2021 33.0  30.0 - 36.0 g/dL Final   RDW 03/02/2021 15.0  11.5 - 15.5 % Final   Platelets 03/02/2021 182  150 - 400 K/uL Final   nRBC 03/02/2021 0.0  0.0 - 0.2 % Final   Sodium 03/02/2021 136  135 - 145 mmol/L Final   Potassium 03/02/2021 4.6  3.5 - 5.1 mmol/L Final   Chloride 03/02/2021 106  98 - 111 mmol/L Final   CO2 03/02/2021 21 (A) 22 - 32 mmol/L Final   Glucose, Bld 03/02/2021 98  70 - 99 mg/dL Final   BUN 03/02/2021 50 (A) 8 - 23 mg/dL Final   Creatinine, Ser 03/02/2021 2.82 (A) 0.61 - 1.24 mg/dL Final   Calcium 03/02/2021 8.6 (A) 8.9 - 10.3 mg/dL Final   GFR, Estimated 03/02/2021 24 (A) >60 mL/min Final   Anion gap 03/02/2021 9  5 - 15 Final   Troponin I (High Sensitivity) 03/02/2021 17  <18 ng/L Final   Specimen Description 03/02/2021 BLOOD RIGHT ASSIST CONTROL   Final   Special Requests 03/02/2021 BOTTLES DRAWN AEROBIC AND ANAEROBIC Blood Culture adequate volume   Final   Culture 03/02/2021    Final                   Value:NO  GROWTH < 12 HOURS Performed at [facility], [address] [place], [place] [address]    Report Status 03/02/2021 PENDING   Incomplete   Specimen Description 03/02/2021 BLOOD LEFT ASSIST CONTROL   Final   Special Requests 03/02/2021 BOTTLES DRAWN AEROBIC AND ANAEROBIC Blood Culture adequate volume   Final   Culture 03/02/2021    Final                   Value:NO GROWTH < 12 HOURS Performed at [facility], [address] [place], [place] [address]    Report Status 03/02/2021 PENDING   Incomplete   Lactic Acid, Venous 03/02/2021 0.9  0.5 - 1.9 mmol/L Final   Lactic Acid, Venous 03/03/2021 0.7  0.5 - 1.9 mmol/L Final   Glucose-Capillary 03/03/2021 92  70 - 99 mg/dL Final   Glucose-Capillary 03/03/2021 117 (A) 70 - 99 mg/dL Final     Recent Results (from the past 240 hour(s))  Blood culture (routine x 2)     Status: None (Preliminary result)   Collection Time: 03/02/21  9:07 PM   Specimen: BLOOD  Result Value Ref Range Status   Specimen Description BLOOD RIGHT ASSIST CONTROL  Final   Special Requests   Final    BOTTLES DRAWN AEROBIC AND ANAEROBIC Blood Culture adequate volume   Culture   Final    NO GROWTH < 12 HOURS Performed at [facility], [address] [place], [place] [address]    Report Status PENDING  Incomplete  Blood culture (routine x 2)     Status: None (Preliminary result)   Collection Time: 03/02/21  9:07 PM   Specimen: BLOOD  Result Value Ref Range Status   Specimen Description BLOOD  LEFT ASSIST CONTROL  Final   Special Requests   Final    BOTTLES DRAWN AEROBIC AND ANAEROBIC Blood Culture adequate volume   Culture   Final    NO GROWTH < 12 HOURS Performed at [facility], [address] [place], [place] [address]    Report Status PENDING  Incomplete     Imaging Studies  DG Elbow Complete Right  Result Date: 03/02/2021 CLINICAL DATA:  Elbow cellulitis EXAM: RIGHT ELBOW - COMPLETE 3+ VIEW COMPARISON:  None.  FINDINGS: No acute fracture or dislocation is noted. Minimal elevation of the anterior fat pad is noted which may be related to a small joint effusion. Considerable soft tissue swelling is noted particularly posteriorly and along the medial and lateral aspects of the elbow consistent with the known history of cellulitis. IMPRESSION: Changes consistent with cellulitis. No acute bony abnormality is noted. Electronically Signed   By: Inez Catalina M.D.   On: 03/02/2021 21:59   DG Chest Portable 1 View  Result Date: 03/02/2021 CLINICAL DATA:  Chest pain EXAM: PORTABLE CHEST 1 VIEW COMPARISON:  02/18/2021 FINDINGS: Cardiac shadow remains enlarged. Lungs are well aerated bilaterally. Mild vascular congestion is seen without focal infiltrate. No bony abnormality is noted. IMPRESSION: Mild vascular congestion without focal infiltrate. Electronically Signed   By: Inez Catalina M.D.   On: 03/02/2021 22:00   Medications   Scheduled Meds:  amLODipine  5 mg Oral Daily   apixaban  2.5 mg Oral BID   escitalopram  10 mg Oral Q breakfast   gabapentin  100 mg Oral TID   hydrALAZINE  100 mg Oral TID   insulin aspart  0-20 Units Subcutaneous TID WC   insulin aspart  0-5 Units Subcutaneous QHS   isosorbide mononitrate  30 mg Oral BID   pantoprazole  40 mg Oral Daily   rosuvastatin  10 mg Oral QHS   Continuous Infusions:  cefTRIAXone (ROCEPHIN)  IV     cefTRIAXone (ROCEPHIN)  IV Stopped (03/03/21 0033)     LOS: 1 day   Time spent: >35 min   Richarda Osmond, DO [facility] 03/03/2021, 8:54 AM   To contact the [facility] Attending or Consulting provider for this patient: Check the care team in [facility] for a) attending/consulting [place] provider listed and b) the [facility] team listed Log into [email or web address] and use [place]'s universal password to access. If you do not have the password, please contact the hospital operator. Locate the [facility] provider you are looking for under [facility] and page to a number  that you can be directly reached. If you still have difficulty reaching the provider, please page the [facility] (Director on Call) for the Hospitalists listed on amion for assistance.

## 2021-03-04 ENCOUNTER — Other Ambulatory Visit (INDEPENDENT_AMBULATORY_CARE_PROVIDER_SITE_OTHER): Payer: Self-pay | Admitting: Vascular Surgery

## 2021-03-04 DIAGNOSIS — I10 Essential (primary) hypertension: Secondary | ICD-10-CM

## 2021-03-04 LAB — GLUCOSE, CAPILLARY
Glucose-Capillary: 170 mg/dL — ABNORMAL HIGH (ref 70–99)
Glucose-Capillary: 175 mg/dL — ABNORMAL HIGH (ref 70–99)
Glucose-Capillary: 152 mg/dL — ABNORMAL HIGH (ref 70–99)
Glucose-Capillary: 164 mg/dL — ABNORMAL HIGH (ref 70–99)

## 2021-03-04 LAB — COMPREHENSIVE METABOLIC PANEL
ALT: 24 U/L (ref 0–44)
AST: 26 U/L (ref 15–41)
Albumin: 2.8 g/dL — ABNORMAL LOW (ref 3.5–5.0)
Alkaline Phosphatase: 62 U/L (ref 38–126)
Anion gap: 10 (ref 5–15)
BUN: 51 mg/dL — ABNORMAL HIGH (ref 8–23)
CO2: 19 mmol/L — ABNORMAL LOW (ref 22–32)
Calcium: 8.7 mg/dL — ABNORMAL LOW (ref 8.9–10.3)
Chloride: 105 mmol/L (ref 98–111)
Creatinine, Ser: 2.52 mg/dL — ABNORMAL HIGH (ref 0.61–1.24)
GFR, Estimated: 27 mL/min — ABNORMAL LOW (ref >60)
Glucose, Bld: 146 mg/dL — ABNORMAL HIGH (ref 70–99)
Potassium: 4.2 mmol/L (ref 3.5–5.1)
Sodium: 134 mmol/L — ABNORMAL LOW (ref 135–145)
Total Bilirubin: 0.4 mg/dL (ref 0.3–1.2)
Total Protein: 6.3 g/dL — ABNORMAL LOW (ref 6.5–8.1)

## 2021-03-04 LAB — CBC
HCT: 28.7 % — ABNORMAL LOW (ref 39.0–52.0)
Hemoglobin: 9.4 g/dL — ABNORMAL LOW (ref 13.0–17.0)
MCH: 27.4 pg (ref 26.0–34.0)
MCHC: 32.8 g/dL (ref 30.0–36.0)
MCV: 83.7 fL (ref 80.0–100.0)
Platelets: 182 10*3/uL (ref 150–400)
RBC: 3.43 MIL/uL — ABNORMAL LOW (ref 4.22–5.81)
RDW: 14.9 % (ref 11.5–15.5)
WBC: 5.8 10*3/uL (ref 4.0–10.5)
nRBC: 0 % (ref 0.0–0.2)

## 2021-03-04 MED ORDER — APIXABAN 5 MG PO TABS
5.0000 mg | ORAL_TABLET | Freq: Two times a day (BID) | ORAL | Status: DC
  Administered 2021-03-04 – 2021-03-07 (×6): 5 mg via ORAL
  Filled 2021-03-04 (×6): qty 1

## 2021-03-04 MED ORDER — SODIUM CHLORIDE 0.9 % IV SOLN: INTRAVENOUS | Status: DC

## 2021-03-04 NOTE — Progress Notes (Signed)
PROGRESS NOTE  Larry Callahan    DOB: [birth year], 65 y.o.  AUQ:[id number]  PCP: Lavera Guise, MD   Code Status: Full Code   DOA: 03/02/2021   LOS: 2  Brief Narrative of Current Hospitalization  Larry Callahan is a 65 y.o. male with a PMH significant for CAD with moderate risk Myoview on 09/2020, CKD 3B, HTN, DM, COPD, OSA on CPAP, systolic heart failure, last EF 35 to 40% 4/22, atrial flutter on Eliquis. They presented from home to the ED on 03/02/2021 with R arm infection not improving on home PO antibiotics. In the ED, it was found that they had cellulitis of RUE. They were treated with IV Abx.  Patient was admitted to medicine service for further workup and management of cellulitis as well as PAD/cellulitis of foot as outlined in detail below.  03/04/21 - doing well overall.   Assessment & Plan  Principal Problem:   Cellulitis of right arm Active Problems:   Obstructive sleep apnea, adult   COPD (chronic obstructive pulmonary disease) (HCC)   Essential hypertension   Chronic systolic CHF (congestive heart failure) (HCC)   Acute kidney injury superimposed on CKD 3b (HCC)   Type 2 diabetes mellitus with stage 3b chronic kidney disease, with long-term current use of insulin (HCC)   Atrial flutter (HCC)   Chronic anticoagulation  Cellulitis of R upper arm. Remains afebrile. Bx Cx NG 2 days. WBC wnl. Discussed possible I&D with patient but there is no significant fluid collection on [name] and there is spontaneous drainage of the wound so will hold off for now. Due to persistent amount purulence draining from arm and surrounding erythema, will continue with IV Abx. Could potentially switch to PO tomorrow and send dc for OP follow up.  - continue CTX - wound care  AKI on CKDIII- Cr improved to 2.52 today - BMP am  A-flutter- HR stable 85-88 today. - continue eliquis - continue carvedilol  OSA- - continue CPAP nightly  HTN  HFrEF- home medications include: amlodipine  5mg , hydralazine 100mg  TID, imdur 30mg  BID - continue home meds  COPD- stable. home meds include: breztri, albuterol - continue home meds  T2DM- home meds include: glimepiride, 26u glargine daily. Hgb A1c was 6.2 in July so below goal of 7. - sliding scale insulin and CBG monitoring with meals - discontinued night time insulin  Depression- home meds include: lexapro - continue home meds  DVT prophylaxis: apixaban (ELIQUIS) tablet 2.5 mg Start: 03/02/21 2330 apixaban (ELIQUIS) tablet 2.5 mg   Diet:  Diet Orders (From admission, onward)     Start     Ordered   03/02/21 2302  Diet heart healthy/carb modified Room service appropriate? Yes; Fluid consistency: Thin  Diet effective now       Question Answer Comment  Diet-HS Snack? Nothing   Room service appropriate? Yes   Fluid consistency: Thin      03/02/21 2302            Subjective 03/04/21    Patient feels well overall. Complains of significant drainage from arm. Pain is non-existent unless touched to wound. No fevers. Needs to go home tomorrow to feed his cat.  Disposition Plan & Communication  Status is: Inpatient  Remains inpatient appropriate because:IV treatments appropriate due to intensity of illness or inability to take PO  Dispo: The patient is from: Home              Anticipated d/c is to: Home  Patient currently is not medically stable to d/c.   Difficult to place patient No   Family Communication: daughter at bedside   Consults, Procedures, Significant Events  Consultants:  none  Procedures/significant events:  None at this time  Antimicrobials:  Anti-infectives (From admission, onward)    Start     Dose/Rate Route Frequency Ordered Stop   03/03/21 2200  cefTRIAXone (ROCEPHIN) 1 g in sodium chloride 0.9 % 100 mL IVPB  Status:  Discontinued        1 g 200 mL/hr over 30 Minutes Intravenous Every 24 hours 03/02/21 2302 03/03/21 2045   03/02/21 2145  cefTRIAXone (ROCEPHIN) 2 g in  sodium chloride 0.9 % 100 mL IVPB        2 g 200 mL/hr over 30 Minutes Intravenous Every 24 hours 03/02/21 2131 03/09/21 2144        Objective   Vitals:   03/03/21 2103 03/03/21 2249 03/03/21 2250 03/04/21 0314  BP: (!) 161/92  (!) 161/92 (!) 143/86  Pulse: 88  88 86  Resp: 16  16 16   Temp: 97.9 F (36.6 C)  97.9 F (36.6 C) 98 F (36.7 C)  TempSrc: Oral Oral Oral   SpO2: 96%   97%  Weight:   122.5 kg   Height:   5\' 11"  (1.803 m)    No intake or output data in the 24 hours ending 03/04/21 0823  Filed Weights   03/03/21 2250  Weight: 122.5 kg    Patient BMI: Body mass index is 37.66 kg/m.   Physical Exam: General: awake, alert, NAD HEENT: atraumatic, clear conjunctiva, anicteric sclera, moist mucus membranes, hearing grossly normal Respiratory: normal respiratory effort. Cardiovascular: normal S1/S2, RRR Nervous: A&O x4. no gross focal neurologic deficits, normal speech Extremities: moves all equally, normal tone Skin: Erythema of right arm with induration and tenderness to palpation. Purulent drainage with manual pressure. Please see clinical image for further detail. Psychiatry: normal mood   Labs   I have personally reviewed following labs and imaging studies Admission on 03/02/2021  Component Date Value Ref Range Status   Color, Urine 03/02/2021 YELLOW  YELLOW Final   APPearance 03/02/2021 CLEAR  CLEAR Final   Specific Gravity, Urine 03/02/2021 1.020  1.005 - 1.030 Final   pH 03/02/2021 5.5  5.0 - 8.0 Final   Glucose, UA 03/02/2021 NEGATIVE  NEGATIVE mg/dL Final   Hgb urine dipstick 03/02/2021 NEGATIVE  NEGATIVE Final   Bilirubin Urine 03/02/2021 NEGATIVE  NEGATIVE Final   Ketones, ur 03/02/2021 NEGATIVE  NEGATIVE mg/dL Final   Protein, ur 03/02/2021 >300 (A) NEGATIVE mg/dL Final   Nitrite 03/02/2021 NEGATIVE  NEGATIVE Final   Leukocytes,Ua 03/02/2021 NEGATIVE  NEGATIVE Final   Squamous Epithelial / LPF 03/02/2021 0-5  0 - 5 Final   WBC, UA 03/02/2021 0-5   0 - 5 WBC/hpf Final   RBC / HPF 03/02/2021 0-5  0 - 5 RBC/hpf Final   Bacteria, UA 03/02/2021 NONE SEEN  NONE SEEN Final   Mucus 03/02/2021 PRESENT   Final   Hyaline Casts, UA 03/02/2021 PRESENT   Final   WBC 03/02/2021 7.6  4.0 - 10.5 K/uL Final   RBC 03/02/2021 3.66 (A) 4.22 - 5.81 MIL/uL Final   Hemoglobin 03/02/2021 10.0 (A) 13.0 - 17.0 g/dL Final   HCT 03/02/2021 30.3 (A) 39.0 - 52.0 % Final   MCV 03/02/2021 82.8  80.0 - 100.0 fL Final   MCH 03/02/2021 27.3  26.0 - 34.0 pg Final   MCHC 03/02/2021  33.0  30.0 - 36.0 g/dL Final   RDW 03/02/2021 15.0  11.5 - 15.5 % Final   Platelets 03/02/2021 182  150 - 400 K/uL Final   nRBC 03/02/2021 0.0  0.0 - 0.2 % Final   Sodium 03/02/2021 136  135 - 145 mmol/L Final   Potassium 03/02/2021 4.6  3.5 - 5.1 mmol/L Final   Chloride 03/02/2021 106  98 - 111 mmol/L Final   CO2 03/02/2021 21 (A) 22 - 32 mmol/L Final   Glucose, Bld 03/02/2021 98  70 - 99 mg/dL Final   BUN 03/02/2021 50 (A) 8 - 23 mg/dL Final   Creatinine, Ser 03/02/2021 2.82 (A) 0.61 - 1.24 mg/dL Final   Calcium 03/02/2021 8.6 (A) 8.9 - 10.3 mg/dL Final   GFR, Estimated 03/02/2021 24 (A) >60 mL/min Final   Anion gap 03/02/2021 9  5 - 15 Final   Troponin I (High Sensitivity) 03/02/2021 17  <18 ng/L Final   Specimen Description 03/02/2021 BLOOD RIGHT ASSIST CONTROL   Final   Special Requests 03/02/2021 BOTTLES DRAWN AEROBIC AND ANAEROBIC Blood Culture adequate volume   Final   Culture 03/02/2021    Final                   Value:NO GROWTH 2 DAYS Performed at [facility], [address] [place], [place] [address]    Report Status 03/02/2021 PENDING   Incomplete   Specimen Description 03/02/2021 BLOOD LEFT ASSIST CONTROL   Final   Special Requests 03/02/2021 BOTTLES DRAWN AEROBIC AND ANAEROBIC Blood Culture adequate volume   Final   Culture 03/02/2021    Final                   Value:NO GROWTH 2 DAYS Performed at [facility], [address] [place], [place]  [address]    Report Status 03/02/2021 PENDING   Incomplete   Lactic Acid, Venous 03/02/2021 0.9  0.5 - 1.9 mmol/L Final   Lactic Acid, Venous 03/03/2021 0.7  0.5 - 1.9 mmol/L Final   Glucose-Capillary 03/03/2021 92  70 - 99 mg/dL Final   Glucose-Capillary 03/03/2021 117 (A) 70 - 99 mg/dL Final   Glucose-Capillary 03/03/2021 238 (A) 70 - 99 mg/dL Final   Glucose-Capillary 03/03/2021 131 (A) 70 - 99 mg/dL Final   WBC 03/04/2021 5.8  4.0 - 10.5 K/uL Final   RBC 03/04/2021 3.43 (A) 4.22 - 5.81 MIL/uL Final   Hemoglobin 03/04/2021 9.4 (A) 13.0 - 17.0 g/dL Final   HCT 03/04/2021 28.7 (A) 39.0 - 52.0 % Final   MCV 03/04/2021 83.7  80.0 - 100.0 fL Final   MCH 03/04/2021 27.4  26.0 - 34.0 pg Final   MCHC 03/04/2021 32.8  30.0 - 36.0 g/dL Final   RDW 03/04/2021 14.9  11.5 - 15.5 % Final   Platelets 03/04/2021 182  150 - 400 K/uL Final   nRBC 03/04/2021 0.0  0.0 - 0.2 % Final   Sodium 03/04/2021 134 (A) 135 - 145 mmol/L Final   Potassium 03/04/2021 4.2  3.5 - 5.1 mmol/L Final   Chloride 03/04/2021 105  98 - 111 mmol/L Final   CO2 03/04/2021 19 (A) 22 - 32 mmol/L Final   Glucose, Bld 03/04/2021 146 (A) 70 - 99 mg/dL Final   BUN 03/04/2021 51 (A) 8 - 23 mg/dL Final   Creatinine, Ser 03/04/2021 2.52 (A) 0.61 - 1.24 mg/dL Final   Calcium 03/04/2021 8.7 (A) 8.9 - 10.3 mg/dL Final   Total Protein  03/04/2021 6.3 (A) 6.5 - 8.1 g/dL Final   Albumin 03/04/2021 2.8 (A) 3.5 - 5.0 g/dL Final   AST 03/04/2021 26  15 - 41 U/L Final   ALT 03/04/2021 24  0 - 44 U/L Final   Alkaline Phosphatase 03/04/2021 62  38 - 126 U/L Final   Total Bilirubin 03/04/2021 0.4  0.3 - 1.2 mg/dL Final   GFR, Estimated 03/04/2021 27 (A) >60 mL/min Final   Anion gap 03/04/2021 10  5 - 15 Final     Recent Results (from the past 240 hour(s))  Blood culture (routine x 2)     Status: None (Preliminary result)   Collection Time: 03/02/21  9:07 PM   Specimen: BLOOD  Result Value Ref Range Status   Specimen Description BLOOD RIGHT  ASSIST CONTROL  Final   Special Requests   Final    BOTTLES DRAWN AEROBIC AND ANAEROBIC Blood Culture adequate volume   Culture   Final    NO GROWTH 2 DAYS Performed at [facility], [address] [place], [place] [address]    Report Status PENDING  Incomplete  Blood culture (routine x 2)     Status: None (Preliminary result)   Collection Time: 03/02/21  9:07 PM   Specimen: BLOOD  Result Value Ref Range Status   Specimen Description BLOOD LEFT ASSIST CONTROL  Final   Special Requests   Final    BOTTLES DRAWN AEROBIC AND ANAEROBIC Blood Culture adequate volume   Culture   Final    NO GROWTH 2 DAYS Performed at [facility], [address] [place], [place] [address]    Report Status PENDING  Incomplete     Imaging Studies  No results found. Medications   Scheduled Meds:  amLODipine  5 mg Oral Daily   apixaban  2.5 mg Oral BID   escitalopram  10 mg Oral Q breakfast   gabapentin  100 mg Oral TID   hydrALAZINE  100 mg Oral TID   insulin aspart  0-20 Units Subcutaneous TID WC   isosorbide mononitrate  30 mg Oral BID   pantoprazole  40 mg Oral Daily   rosuvastatin  10 mg Oral QHS   Continuous Infusions:  cefTRIAXone (ROCEPHIN)  IV 2 g (03/03/21 2134)     LOS: 2 days   Time spent: >35 min   Richarda Osmond, DO [facility] 03/04/2021, 8:23 AM   To contact the [facility] Attending or Consulting provider for this patient: Check the care team in [facility] for a) attending/consulting [place] provider listed and b) the [facility] team listed Log into [email or web address] and use [place]'s universal password to access. If you do not have the password, please contact the hospital operator. Locate the [facility] provider you are looking for under [facility] and page to a number that you can be directly reached. If you still have difficulty reaching the provider, please page the [facility] (Director on Call) for the Hospitalists listed on amion for assistance.

## 2021-03-04 NOTE — Consult Note (Addendum)
[facility] Vascular Consult Note  MRN : [id number]  Larry Callahan is a 65 y.o. (11-26-1955) male who presents with chief complaint of  Chief Complaint  Patient presents with   Urinary Retention   Arm Pain   History of Present Illness:  Larry Callahan is a 65 year old male with medical history significant for CAD with moderate risk Myoview on 09/2020, CKD 3B, HTN, DM, COPD, OSA on CPAP, systolic heart failure, last EF 35 to 40% 4/22, atrial flutter on Eliquis, who presents to the ED for the second time in 2 days, with pain and redness of the right upper arm.    On his first visit on 9/19 he was discharged on cefdinir however his home health nurse sent him for reevaluation in the ED today due to concern for extension of the redness.  Patient denies fever or chills.  Denies chest pain or shortness of breath.  Did endorse some dysuria and difficulty getting his urine out over the past couple days but that seems tohave improved.  Denies cough.   ED course: On arrival, afebrile, BP 153/72, pulse 103, O2 sat 100% on room air Blood work: WBC 7600.  Hemoglobin 10 which is baseline, creatinine 2.82, up from baseline of 1.94, troponin 17, urinalysis unremarkable   Imaging: X-ray of the right elbow: Changes consistent with cellulitis with no bony abnormality Chest x-ray: Mild vascular congestion without focal infiltrate   Patient started on Rocephin.    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 States that they were recommended for amputation of toe but have not proceeded yet. No pain with palpation. Requested to be transferred to [place], where he receives his main care, for definitive care if possible but are open to being treated here as well.  As per the patient's daughter vascular surgery was consulted for possible intervention in the setting of a chronic foot wound.  Current Facility-Administered Medications  Medication Dose Route Frequency  Provider Last Rate Last Admin   acetaminophen (TYLENOL) tablet 650 mg  650 mg Oral Q6H PRN Athena Masse, MD       Or   acetaminophen (TYLENOL) suppository 650 mg  650 mg Rectal Q6H PRN Athena Masse, MD       amLODipine (NORVASC) tablet 5 mg  5 mg Oral Daily Judd Gaudier V, MD   5 mg at 03/04/21 5784   apixaban (ELIQUIS) tablet 2.5 mg  2.5 mg Oral BID Judd Gaudier V, MD   2.5 mg at 03/04/21 6962   cefTRIAXone (ROCEPHIN) 2 g in sodium chloride 0.9 % 100 mL IVPB  2 g Intravenous Q24H Vanessa Posey, MD 200 mL/hr at 03/03/21 2134 2 g at 03/03/21 2134   escitalopram (LEXAPRO) tablet 10 mg  10 mg Oral Q breakfast Athena Masse, MD   10 mg at 03/04/21 9528   gabapentin (NEURONTIN) capsule 100 mg  100 mg Oral TID Athena Masse, MD   100 mg at 03/04/21 4132   hydrALAZINE (APRESOLINE) tablet 100 mg  100 mg Oral TID Athena Masse, MD   100 mg at 03/04/21 4401   HYDROcodone-acetaminophen (NORCO/VICODIN) 5-325 MG per tablet 1-2 tablet  1-2 tablet Oral Q4H PRN Athena Masse, MD       insulin aspart (novoLOG) injection 0-20 Units  0-20 Units Subcutaneous TID WC Athena Masse, MD   4 Units at 03/04/21 0272   ipratropium-albuterol (DUONEB) 0.5-2.5 (3) MG/3ML nebulizer solution 3 mL  3 mL Nebulization Q6H PRN Athena Masse, MD       isosorbide mononitrate (IMDUR) 24 hr tablet 30 mg  30 mg Oral BID Athena Masse, MD   30 mg at 03/04/21 0786   meclizine (ANTIVERT) tablet 25 mg  25 mg Oral TID PRN Athena Masse, MD       ondansetron [facility]) tablet 4 mg  4 mg Oral Q6H PRN Athena Masse, MD       Or   ondansetron [facility]) injection 4 mg  4 mg Intravenous Q6H PRN Athena Masse, MD       pantoprazole (PROTONIX) EC tablet 40 mg  40 mg Oral Daily Judd Gaudier V, MD   40 mg at 03/04/21 7544   rosuvastatin (CRESTOR) tablet 10 mg  10 mg Oral QHS Athena Masse, MD   10 mg at 03/03/21 2125   Past Medical History:  Diagnosis Date   Allergy    Chronic combined systolic (congestive) and diastolic  (congestive) heart failure ([place])    a. 04/2019 Echo: EF 45-50%; b. 02/2019 Echo: EF 55-60%; c. 09/2020 Echo: EF 35-40%, glob HK. Nl RV size/fxn. Mild MR.   CKD (chronic kidney disease), stage III (HCC)    COPD (chronic obstructive pulmonary disease) ([place])    Coronary artery disease    a. 2011 Cath: nonobs dzs; b. 09/2020 MV: EF 38%. No signif ischemia. ? inf MI vs diaph attenuation. GI uptake artifact.   Diabetes mellitus without complication ([place])    GERD (gastroesophageal reflux disease)    Hyperlipidemia    Hypertension    Morbid obesity ([place])    NICM (nonischemic cardiomyopathy) ([place])    a. 04/2019 Echo: EF 45-50%; b. 02/2019 Echo: EF 55-60%; c. 09/2020 Echo: EF 35-40%, glob HK; d. 09/2020 MV: No ischemia. ? inf infarct vs attenuation.   OSA (obstructive sleep apnea)    Past Surgical History:  Procedure Laterality Date   BACK SURGERY     CARDIAC CATHETERIZATION     CHOLECYSTECTOMY     COLONOSCOPY WITH PROPOFOL N/A 04/12/2019   Procedure: COLONOSCOPY WITH PROPOFOL;  Surgeon: [name], MD;  Location: [facility] ENDOSCOPY;  Service: Gastroenterology;  Laterality: N/A;   ESOPHAGOGASTRODUODENOSCOPY N/A 04/12/2019   Procedure: ESOPHAGOGASTRODUODENOSCOPY (EGD);  Surgeon: [name], MD;  Location: [facility] ENDOSCOPY;  Service: Gastroenterology;  Laterality: N/A;   ESOPHAGOGASTRODUODENOSCOPY (EGD) WITH PROPOFOL N/A 11/21/2019   Procedure: ESOPHAGOGASTRODUODENOSCOPY (EGD) WITH PROPOFOL;  Surgeon: [name], MD;  Location: [facility] ENDOSCOPY;  Service: Gastroenterology;  Laterality: N/A;   FLEXIBLE BRONCHOSCOPY Bilateral 05/17/2019   Procedure: FLEXIBLE BRONCHOSCOPY;  Surgeon: [name], MD;  Location: [name] ORS;  Service: Pulmonary;  Laterality: Bilateral;   left arm surgery     nephrectomy Left    PARATHYROIDECTOMY     RIGHT HEART CATH N/A 03/29/2019   Procedure: RIGHT HEART CATH;  Surgeon: [name], MD;  Location: [place] [facility] [place] CV LAB;  Service: Cardiovascular;   Laterality: N/A;   Social History Social History   Tobacco Use   Smoking status: Former    Types: Cigarettes   Smokeless tobacco: Former    Types: Nurse, children's Use: Never used  Substance Use Topics   Alcohol use: No   Drug use: No   Family History Family History  Problem Relation Age of Onset   Diabetes Mother        2003   Lung cancer Father    Diabetes Sister  Hypertension Sister    Heart disease Sister    Cancer Sister    Bone cancer Brother   Denies family history of peripheral artery disease, venous disease or renal disease.  Allergies  Allergen Reactions   Penicillins Rash   REVIEW OF SYSTEMS (Negative unless checked)  Constitutional: _0 Weight loss  _1 Fever  _2 Chills Cardiac: _3 Chest pain   _4 Chest pressure   _5 Palpitations   _6 Shortness of breath when laying flat   _7 Shortness of breath at rest   _8 Shortness of breath with exertion. Vascular:  _9 Pain in legs with walking   _10 Pain in legs at rest   _11 Pain in legs when laying flat   _12 Claudication   _13 Pain in feet when walking  _14 Pain in feet at rest  _15 Pain in feet when laying flat   _16 History of DVT   _17 Phlebitis   _18 Swelling in legs   _19 Varicose veins   _20 Non-healing ulcers Pulmonary:   _21 Uses home oxygen   _22 Productive cough   _23 Hemoptysis   _24 Wheeze  _25 COPD   _26 Asthma Neurologic:  _27 Dizziness  _28 Blackouts   _29 Seizures   _30 History of stroke   _31 History of TIA  _32 Aphasia   _33 Temporary blindness   _34 Dysphagia   _35 Weakness or numbness in arms   _36 Weakness or numbness in legs Musculoskeletal:  _37 Arthritis   _38 Joint swelling   _39 Joint pain   _40 Low back pain Hematologic:  _41 Easy bruising  _42 Easy bleeding   _43 Hypercoagulable state   _44 Anemic  _45 Hepatitis Gastrointestinal:  _46 Blood in stool   _47 Vomiting blood  _48 Gastroesophageal reflux/heartburn   _49 Difficulty swallowing. Genitourinary:  _50 Chronic kidney disease   _51 Difficult urination  _52 Frequent urination  _53 Burning with urination   _54 Blood in urine Skin:   _55 Rashes   _56 Ulcers   _57 Wounds Psychological:  _58 History of anxiety   _59  History of major depression.  Positive for right upper extremity erythema, edema and discomfort.  Physical Examination  Vitals:   03/03/21 2103 03/03/21 2249 03/03/21 2250 03/04/21 0314  BP: (!) 161/92  (!) 161/92 (!) 143/86  Pulse: 88  88 86  Resp: _60 Temp: 97.9 F (36.6 C)  97.9 F (36.6 C) 98 F (36.7 C)  TempSrc: Oral Oral Oral   SpO2: 96%   97%  Weight:   122.5 kg   Height:   _61  (1.803 m)    Body mass index is 37.66 kg/m. Gen:  WD/WN, NAD Head: [place]/AT, No temporalis wasting. Prominent temp pulse not noted. Ear/Nose/Throat: Hearing grossly intact, nares w/o erythema or drainage, oropharynx w/o Erythema/Exudate Eyes: Sclera non-icteric, conjunctiva clear Neck: Trachea midline.  No JVD.  Pulmonary:  Good air movement, respirations not labored, equal bilaterally.  Cardiac: RRR, normal S1, S2. Vascular:  Vessel Right Left  Radial Palpable Palpable  Ulnar Palpable Palpable  Brachial Palpable Palpable  Carotid Palpable, without bruit Palpable, without bruit  Aorta Not palpable N/A  Femoral Palpable Palpable  Popliteal Palpable Palpable  PT Non-Palpable Non-Palpable  DP Non-Palpable Non-Palpable   Gastrointestinal: soft, non-tender/non-distended. No guarding/reflex.  Musculoskeletal: M/S 5/5 throughout.  Extremities without ischemic changes.  No deformity or atrophy. No edema. Neurologic: Sensation grossly intact in extremities.  Symmetrical.  Speech is fluent. Motor exam as listed above. Psychiatric: Judgment intact, Mood & affect appropriate for pt's clinical situation. Dermatologic: No rashes or ulcers noted.  No cellulitis or open wounds. Lymph : No Cervical, Axillary, or Inguinal lymphadenopathy.  CBC Lab Results  Component Value Date   WBC 5.8 03/04/2021   HGB 9.4 (L) 03/04/2021   HCT  28.7 (L) 03/04/2021   MCV 83.7 03/04/2021   PLT 182 03/04/2021   BMET    Component  Value Date/Time   NA 134 (L) 03/04/2021 0533   NA 141 02/27/2018 0844   NA 138 03/06/2013 0621   K 4.2 03/04/2021 0533   K 3.9 03/06/2013 0621   CL 105 03/04/2021 0533   CL 107 03/06/2013 0621   CO2 19 (L) 03/04/2021 0533   CO2 23 03/06/2013 0621   GLUCOSE 146 (H) 03/04/2021 0533   GLUCOSE 157 (H) 03/06/2013 0621   BUN 51 (H) 03/04/2021 0533   BUN 24 02/27/2018 0844   BUN 19 (H) 03/06/2013 0621   CREATININE 2.52 (H) 03/04/2021 0533   CREATININE 1.01 03/06/2013 0621   CALCIUM 8.7 (L) 03/04/2021 0533   CALCIUM 9.3 03/06/2013 0621   GFRNONAA 27 (L) 03/04/2021 0533   GFRNONAA >60 03/06/2013 0621   GFRAA 51 (L) 02/18/2020 1730   GFRAA >60 03/06/2013 3734   Estimated Creatinine Clearance: 37.9 mL/min (A) (by C-G formula based on SCr of 2.52 mg/dL (H)).  COAG Lab Results  Component Value Date   INR 1.1 10/06/2020   INR 1.1 09/25/2020   INR 1.0 11/19/2019   Radiology DG Chest 2 View  Result Date: 02/18/2021 CLINICAL DATA:  Three days of right-sided rib/chest pain. Shortness of breath. EXAM: CHEST - 2 VIEW COMPARISON:  Radiograph and CT January 28, 2021 FINDINGS: Patient is rotated. Similar cardiac enlargement, least in part accentuated by technique. Pulmonary vascular congestion. No overt pulmonary edema. Tortuous thoracic aorta. No focal consolidation. No visible pleural effusion or pneumothorax. The visualized skeletal structures are unchanged. IMPRESSION: Similar cardiac enlargement and pulmonary vascular congestion without overt pulmonary edema or focal consolidation. Electronically Signed   By: Dahlia Bailiff M.D.   On: 02/18/2021 18:07   DG Elbow Complete Right  Result Date: 03/02/2021 CLINICAL DATA:  Elbow cellulitis EXAM: RIGHT ELBOW - COMPLETE 3+ VIEW COMPARISON:  None. FINDINGS: No acute fracture or dislocation is noted. Minimal elevation of the anterior fat pad is noted which may be related to a small joint effusion. Considerable soft tissue swelling is noted particularly  posteriorly and along the medial and lateral aspects of the elbow consistent with the known history of cellulitis. IMPRESSION: Changes consistent with cellulitis. No acute bony abnormality is noted. Electronically Signed   By: Inez Catalina M.D.   On: 03/02/2021 21:59   CT HEAD WO CONTRAST (5MM)  Result Date: 02/18/2021 CLINICAL DATA:  Numbness. EXAM: CT HEAD WITHOUT CONTRAST TECHNIQUE: Contiguous axial images were obtained from the base of the skull through the vertex without intravenous contrast. COMPARISON:  November 24, 2020. FINDINGS: Brain: No evidence of acute infarction, hemorrhage, hydrocephalus, extra-axial collection or mass lesion/mass effect. Vascular: No hyperdense vessel or unexpected calcification. Skull: Normal. Negative for fracture or focal lesion. Sinuses/Orbits: No acute finding. Other: None. IMPRESSION: No acute intracranial abnormality seen. Electronically Signed   By: Marijo Conception M.D.   On: 02/18/2021 18:53   MR BRAIN WO CONTRAST  Result Date: 02/18/2021 CLINICAL DATA:  Initial evaluation for neuro deficit, stroke suspected. EXAM: MRI HEAD WITHOUT CONTRAST TECHNIQUE: Multiplanar, multiecho pulse sequences of the brain and surrounding structures were obtained without intravenous contrast. COMPARISON:  CT from earlier the same day. FINDINGS: Brain: Cerebral volume within normal limits. Mild chronic microvascular ischemic disease noted involving the periventricular white matter. No abnormal foci of restricted diffusion to suggest acute or subacute ischemia. Gray-white matter differentiation maintained. No areas of remote cortical infarction.  No acute intracranial hemorrhage. Single punctate chronic microhemorrhage noted at the right parietal lobe, of doubtful significance in isolation. No mass lesion, midline shift or mass effect. No hydrocephalus or extra-axial fluid collection. Pituitary gland suprasellar region within normal limits. Midline structures intact. Vascular: Major intracranial  vascular flow voids are maintained. Skull and upper cervical spine: Craniocervical junction within normal limits. Bone marrow signal intensity within normal limits. No scalp soft tissue abnormality. Sinuses/Orbits: Globes and orbital soft tissues demonstrate no acute finding. Paranasal sinuses are largely clear. Small possible dentigerous cysts small left greater than right mastoid effusions. Visualized nasopharynx unremarkable. Inner ear structures grossly normal. Noted at the maxilla, of doubtful significance. Other: None. IMPRESSION: 1. No acute intracranial abnormality. 2. Mild chronic microvascular ischemic disease for age. Electronically Signed   By: Jeannine Boga M.D.   On: 02/18/2021 21:54   DG Chest Portable 1 View  Result Date: 03/02/2021 CLINICAL DATA:  Chest pain EXAM: PORTABLE CHEST 1 VIEW COMPARISON:  02/18/2021 FINDINGS: Cardiac shadow remains enlarged. Lungs are well aerated bilaterally. Mild vascular congestion is seen without focal infiltrate. No bony abnormality is noted. IMPRESSION: Mild vascular congestion without focal infiltrate. Electronically Signed   By: Inez Catalina M.D.   On: 03/02/2021 22:00   [name] RT UPPER EXTREM LTD SOFT TISSUE NON VASCULAR  Result Date: 03/01/2021 CLINICAL DATA:  Posterior right upper extremity swelling and redness EXAM: ULTRASOUND RIGHT UPPER EXTREMITY LIMITED TECHNIQUE: Ultrasound examination of the upper extremity soft tissues was performed in the area of clinical concern. COMPARISON:  None. FINDINGS: Targeted ultrasound was performed of the mid to distal aspect of the posterior right upper arm and medial right forearm at site of patient's clinical concern. Subcutaneous edema and ill-defined fluid is present throughout the superficial soft tissues at these locations. No organized or drainable fluid collections. IMPRESSION: Findings suggestive of cellulitis of the right upper extremity. No organized or drainable fluid collections. Electronically Signed    By: Davina Poke D.O.   On: 03/01/2021 15:16    Assessment/Plan Larry Callahan is a 65 year old male with medical history significant for CAD with moderate risk Myoview on 09/2020, CKD 3B, HTN, DM, COPD, OSA on CPAP, systolic heart failure, last EF 35 to 40% 4/22, atrial flutter on Eliquis, who presents to the ED for the second time in 2 days, with pain and redness of the right upper arm.  As per the doctors request vascular surgery was consulted for chronic wound formation to the left foot.  1.  Right upper extremity cellulitis: Patient has been admitted and is on IV antibiotics. Denies any recent surgery, trauma, insect bite etc. to the area. Patient is perseverating on getting it "drained".  I tried to explain to him that there is not a fluid collection that will allow for this. He has bounding radial and ulnar pulses on exam. There is no role for any vascular intervention at this time Recommend compression and elevation  2.  Type 2 diabetes: On appropriate medications Encouraged good control as its slows the progression of atherosclerotic disease   3.  Tobacco abuse: We had a discussion for approximately three minutes regarding the absolute need for smoking cessation due to the deleterious nature of tobacco on the vascular system. We discussed the tobacco use would diminish patency of any intervention, and likely significantly worsen progressio of disease. We discussed multiple agents for quitting including replacement therapy or medications to reduce cravings such as Chantix. The patient voices their understanding of the importance of smoking  cessation.   4.  Hyperlipidemia: On statin for medical management Encouraged good control as its slows the progression of atherosclerotic disease   Discussed with Dr. Mayme Genta, PA-C 03/04/2021 11:16 AM  This note was created with Dragon medical transcription system.  Any error is purely unintentional.

## 2021-03-04 NOTE — Progress Notes (Signed)
PHARMACY NOTE:  DOSAGE ADJUSTMENT  Current apixaban regimen includes a mismatch between dosage and indication.  As per policy approved by the Pharmacy & Therapeutics and Medical Executive Committees, the dosage will be adjusted accordingly.  Current apixaban dosage:  apixaban 2.5 mg po BID  Indication: atrial flutter  Renal Function:  Estimated Creatinine Clearance: 37.9 mL/min (A) (by C-G formula based on SCr of 2.52 mg/dL (H)). []      On intermittent HD, scheduled: []      On CRRT    apixaban dosage has been changed to:  apixaban 5 mg po BID  Additional comments: this patient meets only 1/3 dose-reduction criteria  (Serum creatinine ?1.5 mg/dL (133 micromol/L) and either ? 65 years of age or body weight ?60 kg)    Thank you for allowing pharmacy to be a part of this patient's care.  Dallie Piles, [facility] 03/04/2021 2:35 PM

## 2021-03-05 ENCOUNTER — Encounter
Admission: EM | Disposition: A | Discharge status: Home / Self Care | Payer: Self-pay | Source: Home / Self Care | Attending: Hospitalist

## 2021-03-05 LAB — GLUCOSE, CAPILLARY
Glucose-Capillary: 137 mg/dL — ABNORMAL HIGH (ref 70–99)
Glucose-Capillary: 229 mg/dL — ABNORMAL HIGH (ref 70–99)
Glucose-Capillary: 102 mg/dL — ABNORMAL HIGH (ref 70–99)
Glucose-Capillary: 200 mg/dL — ABNORMAL HIGH (ref 70–99)

## 2021-03-05 LAB — BASIC METABOLIC PANEL
Anion gap: 8 (ref 5–15)
BUN: 45 mg/dL — ABNORMAL HIGH (ref 8–23)
CO2: 21 mmol/L — ABNORMAL LOW (ref 22–32)
Calcium: 8.4 mg/dL — ABNORMAL LOW (ref 8.9–10.3)
Chloride: 108 mmol/L (ref 98–111)
Creatinine, Ser: 2.12 mg/dL — ABNORMAL HIGH (ref 0.61–1.24)
GFR, Estimated: 33 mL/min — ABNORMAL LOW (ref >60)
Glucose, Bld: 148 mg/dL — ABNORMAL HIGH (ref 70–99)
Potassium: 4.3 mmol/L (ref 3.5–5.1)
Sodium: 137 mmol/L (ref 135–145)

## 2021-03-05 SURGERY — LOWER EXTREMITY ANGIOGRAPHY
Anesthesia: Moderate Sedation | Laterality: Left

## 2021-03-05 NOTE — TOC Initial Note (Signed)
Transition of Care [facility]) - Initial/Assessment Note    Patient Details  Name: Larry Callahan MRN: [id number] Date of Birth: August 15, 1955  Transition of Care [facility]) CM/SW Contact:    Beverly Sessions, RN Phone Number: 03/05/2021, 2:06 PM  Clinical Narrative:                  Patient admitted from home with cellulitis RUE and PAD of leg PCP Humphrey Rolls  Followed by [facility] at home Denies issues obtaining medications Patient has RW and cane in the home  Patient open with [facility] for PT and OT. Alice with [facility] notified of discharge   Expected Discharge Plan: [place] Barriers to Discharge: Barriers Resolved   Patient Goals and CMS Choice        Expected Discharge Plan and Services Expected Discharge Plan: [place]                                   HH Arranged: PT, OT [facility] Agency: [facility] Date [facility] Agency Contacted: 03/05/21   Representative spoke with at [place]: [facility]  Prior Living Arrangements/Services   Lives with:: Self Patient language and need for interpreter reviewed:: Yes Do you feel safe going back to the place where you live?: Yes      Need for Family Participation in Patient Care: No (Comment) Care giver support system in place?: Yes (comment) Current home services: DME Criminal Activity/Legal Involvement Pertinent to Current Situation/Hospitalization: No - Comment as needed  Activities of Daily Living Home Assistive Devices/Equipment: Cane (specify quad or straight), CPAP, Oxygen ADL Screening (condition at time of admission) Patient's cognitive ability adequate to safely complete daily activities?: Yes Is the patient deaf or have difficulty hearing?: No Does the patient have difficulty seeing, even when wearing glasses/contacts?: No Does the patient have difficulty concentrating, remembering, or making decisions?: No Patient able to express need for assistance with ADLs?: Yes Does the patient  have difficulty dressing or bathing?: No Independently performs ADLs?: Yes (appropriate for developmental age) Does the patient have difficulty walking or climbing stairs?: Yes Weakness of Legs: Both Weakness of Arms/Hands: None  Permission Sought/Granted                  Emotional Assessment       Orientation: : Oriented to Self, Oriented to Place, Oriented to  Time, Oriented to Situation, Fluctuating Orientation (Suspected and/or reported Sundowners)      Admission diagnosis:  Cellulitis of right arm [L03.113] Cellulitis of right upper extremity [L03.113] Patient Active Problem List   Diagnosis Date Noted   PAD (peripheral artery disease) ([place])    Cellulitis of right arm 03/02/2021   Chronic anticoagulation 03/02/2021   COPD with acute exacerbation ([place]) 01/29/2021   Atrial flutter ([place]) 01/07/2021   Syncope and collapse 11/16/2020   Demand ischemia ([place])    Influenza A 10/30/2020   HLD (hyperlipidemia) 10/06/2020   History of CVA (cerebrovascular accident) 09/25/2020   Gastroesophageal reflux disease without esophagitis 04/19/2020   Pneumonia due to COVID-19 virus 01/30/2020   Elevated troponin I level 01/29/2020   Suspected COVID-19 virus infection 01/29/2020   Lightheadedness 01/29/2020   Hematemesis 01/09/2020   Hospital discharge follow-up 11/26/2019   Generalized weakness 08/08/2019   Obesity (BMI 30-39.9) 08/08/2019   Acute right hip pain 06/17/2019   Inability to ambulate due to hip 06/17/2019   CKD stage  3 due to type 2 diabetes mellitus ([place])    Hypervolemia    Type 2 diabetes mellitus with stage 3b chronic kidney disease, with long-term current use of insulin ([place])    Hypomagnesemia    Full code status 05/20/2019   Pleuritic chest pain    Multifocal pneumonia 05/19/2019   Acute kidney injury superimposed on CKD 3b ([place]) 05/09/2019   Hemoptysis 04/09/2019   Shortness of breath 03/26/2019   Uncontrolled type 2 diabetes mellitus with insulin therapy  ([place]) 03/12/2019   Acute on chronic heart failure with preserved ejection fraction (HFpEF) (HCC)    Stage 3b chronic kidney disease ([place])    Chronic systolic CHF (congestive heart failure) ([place]) 37/90/2409   Diastolic dysfunction 73/53/2992   Iron deficiency anemia 12/31/2018   Atopic dermatitis 11/24/2018   Screening for colon cancer 11/06/2017   Uncontrolled type 2 diabetes mellitus with hyperglycemia ([place]) 11/06/2017   Hidradenitis 06/09/2017   Atherosclerotic heart disease of native coronary artery without angina pectoris 06/09/2017   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   Obstructive sleep apnea, adult 06/09/2017   Morbid (severe) obesity due to excess calories ([place]) 06/09/2017   Cigarette nicotine dependence with nicotine-induced disorder 06/09/2017   Diabetic polyneuropathy associated with type 2 diabetes mellitus ([place]) 06/09/2017   Allergic rhinitis due to pollen 06/09/2017   Mixed hyperlipidemia 06/09/2017   COPD (chronic obstructive pulmonary disease) ([place]) 06/09/2017   Dyspnea on exertion 06/09/2017   Wheezing 06/09/2017   Dysuria 06/09/2017   Snoring 06/09/2017   Essential hypertension 06/09/2017   Personal history of other malignant neoplasm of kidney 06/09/2017   Pain in right hip 06/09/2017   Impacted cerumen, bilateral 06/09/2017   Secondary hyperparathyroidism, not elsewhere classified ([place]) 06/09/2017   Type 2 diabetes mellitus with hyperglycemia ([place]) 06/09/2017   Tinea corporis 06/09/2017   PCP:  Lavera Guise, MD Pharmacy:   [place] [place] [facility] [address] [place] [address] Phone: [phone] Fax: [phone]     Social Determinants of Health (SDOH) Interventions    Readmission Risk Interventions Readmission Risk Prevention Plan 03/05/2021 02/01/2020 01/31/2020  Transportation Screening Complete Complete Complete  Medication Review Press photographer) Complete Complete Complete   PCP or Specialist appointment within 3-5 days of discharge Complete Complete Complete  HRI or Home Care Consult Complete Complete Complete  SW Recovery Care/Counseling Consult - Complete Complete  SW Consult Not Complete Comments - - -  Palliative Care Screening Not Applicable Not Applicable Not Applicable  Skilled Nursing Facility Not Applicable Not Applicable Not Applicable  Some recent data might be hidden

## 2021-03-05 NOTE — Progress Notes (Signed)
Patient c/o S.O.B. upon assessment auscultation of lung sounds diminished with wheezing with sats of 100-99% on room air. Nurse administered neb tx. Pt. Complained that it was not working properly because he could not see the steam coming out. Nurse noted same as well and switched tubing set to nebulizer, new set working properly, patient continued to say it was not working properly nurse told him that she could visibly see aerosol coming from mask, patient worked himself up and started saying "it's not working, I can't breath. Nurse educated patient to calm down and that it was working and he is getting air, educated him on O2 sats and pt. Began calming down. Pt repositioned in bed to open airway and is now resting comfortably with HOB at 30 degrees.

## 2021-03-05 NOTE — Care Management Important Message (Signed)
Important Message  Patient Details  Name: Larry Callahan MRN: [id number] Date of Birth: Nov 11, 1955   Medicare Important Message Given:  Yes     Dannette Barbara 03/05/2021, 1:27 PM

## 2021-03-05 NOTE — Progress Notes (Signed)
PROGRESS NOTE    Larry Callahan  YTK:[id number] DOB: Aug 31, 1955 DOA: 03/02/2021 PCP: Lavera Guise, MD    Brief Narrative:  This  65 y.o. male with a PMH significant for CAD with moderate risk Myoview on 09/2020, CKD 3B, HTN, DM, COPD, OSA on CPAP, systolic heart failure, last LVEF 35 to 40% 4/22, atrial flutter on Eliquis presented to the ED on 03/02/2021 with R arm infection not improving on home PO antibiotics. In the ED, he ws found that he had cellulitis of RUE. He was treated with IV Abx.  Patient is admitted for right arm cellulitis and PAD cellulitis of foot.  Assessment & Plan:   Principal Problem:   Cellulitis of right arm Active Problems:   Obstructive sleep apnea, adult   COPD (chronic obstructive pulmonary disease) (HCC)   Essential hypertension   Chronic systolic CHF (congestive heart failure) (HCC)   Acute kidney injury superimposed on CKD 3b (HCC)   Type 2 diabetes mellitus with stage 3b chronic kidney disease, with long-term current use of insulin (HCC)   Atrial flutter (HCC)   Chronic anticoagulation  Right upper arm cellulitis: Patient presented with cellulitis of right upper arm, remains afebrile. No leukocytosis, blood cultures No growth so far. There is no significant fluid collection on ultrasound and there is no spontaneous drainage of the wound, no need of incision and drainage. Due to persistent amount purulence draining from arm and surrounding erythema, will continue with IV Abx.  Can potentially switch to p.o. antibiotics tomorrow. Continue ceftriaxone for now Continue wound care   AKI on CKDIII- Cr improved to 2.45 today Avoid nephrotoxic medications, recheck a.m. labs.  Atrial flutter: Heart rate remains stable,  Continue Eliquis and carvedilol   Obstructive sleep apnea: Continue CPAP at night   Hypertension : Continue amlodipine, hydralazine, Imdur.   COPD- stable.  Continue home meds include: breztri, albuterol    T2DM- home meds  include: glimepiride, 26u glargine daily.  Hgb A1c was 6.2 in July so below goal of 7. Continue  sliding scale insulin and CBG monitoring with meals Discontinued night time insulin   Depression-continue Lexapro  DVT prophylaxis: Eliquis Code Status: Full code Family Communication: No family at bedside Disposition Plan:   Status is: Inpatient  Remains inpatient appropriate because:Inpatient level of care appropriate due to severity of illness  Dispo: The patient is from: Home              Anticipated d/c is to: Home              Patient currently is not medically stable to d/c.   Difficult to place patient No  Consultants:  Vascular surgery  Procedures: None Antimicrobials:   Anti-infectives (From admission, onward)    Start     Dose/Rate Route Frequency Ordered Stop   03/03/21 2200  cefTRIAXone (ROCEPHIN) 1 g in sodium chloride 0.9 % 100 mL IVPB  Status:  Discontinued        1 g 200 mL/hr over 30 Minutes Intravenous Every 24 hours 03/02/21 2302 03/03/21 2045   03/02/21 2145  cefTRIAXone (ROCEPHIN) 2 g in sodium chloride 0.9 % 100 mL IVPB        2 g 200 mL/hr over 30 Minutes Intravenous Every 24 hours 03/02/21 2131 03/09/21 2144       Subjective: Patient was seen and examined at bedside.  Overnight events noted.   Patient reports feeling better,  he asked when he can be discharged.   He right  arm cellulitis is improving but still remains erythematous.  Objective: Vitals:   03/05/21 0846 03/05/21 0849 03/05/21 1114 03/05/21 1255  BP: (!) 178/158 (!) 173/97  139/76  Pulse: 93 69  (!) 103  Resp:    18  Temp: 98 F (36.7 C)   98.2 F (36.8 C)  TempSrc: Oral     SpO2: 90%  96% 98%  Weight:      Height:        Intake/Output Summary (Last 24 hours) at 03/05/2021 1500 Last data filed at 03/05/2021 1300 Gross per 24 hour  Intake 960 ml  Output 2050 ml  Net -1090 ml   Filed Weights   03/03/21 2250  Weight: 122.5 kg    Examination:  General exam: Appears  comfortable, not in any acute distress. Respiratory system: Clear to auscultation. Respiratory effort normal. Cardiovascular system: S1-S2 heard, regular rate and rhythm, no murmur . Gastrointestinal system: Abdomen is soft, distended, nontender, BS +. Central nervous system: Alert and oriented X 3. No focal neurological deficits. Extremities: Right posterior upper arm: Erythematous, tender,  warm, no discharge Skin: No rashes, lesions or ulcers Psychiatry: Judgement and insight appear normal. Mood & affect appropriate.     Data Reviewed: I have personally reviewed following labs and imaging studies  CBC: Recent Labs  Lab 03/01/21 1125 03/02/21 1525 03/04/21 0533  WBC 9.4 7.6 5.8  HGB 10.5* 10.0* 9.4*  HCT 32.5* 30.3* 28.7*  MCV 84.9 82.8 83.7  PLT 183 182 992   Basic Metabolic Panel: Recent Labs  Lab 03/01/21 1125 03/02/21 1525 03/04/21 0533 03/05/21 0449  NA 136 136 134* 137  K 4.0 4.6 4.2 4.3  CL 103 106 105 108  CO2 23 21* 19* 21*  GLUCOSE 240* 98 146* 148*  BUN 38* 50* 51* 45*  CREATININE 2.45* 2.82* 2.52* 2.12*  CALCIUM 8.1* 8.6* 8.7* 8.4*   GFR: Estimated Creatinine Clearance: 45.1 mL/min (A) (by C-G formula based on SCr of 2.12 mg/dL (H)). Liver Function Tests: Recent Labs  Lab 03/04/21 0533  AST 26  ALT 24  ALKPHOS 62  BILITOT 0.4  PROT 6.3*  ALBUMIN 2.8*   No results for input(s): LIPASE, AMYLASE in the last 168 hours. No results for input(s): AMMONIA in the last 168 hours. Coagulation Profile: No results for input(s): INR, PROTIME in the last 168 hours. Cardiac Enzymes: No results for input(s): CKTOTAL, CKMB, CKMBINDEX, TROPONINI in the last 168 hours. BNP (last 3 results) No results for input(s): PROBNP in the last 8760 hours. HbA1C: No results for input(s): HGBA1C in the last 72 hours. CBG: Recent Labs  Lab 03/04/21 1228 03/04/21 1639 03/04/21 2114 03/05/21 0821 03/05/21 1123  GLUCAP 175* 152* 164* 137* 229*   Lipid Profile: No  results for input(s): CHOL, HDL, LDLCALC, TRIG, CHOLHDL, LDLDIRECT in the last 72 hours. Thyroid Function Tests: No results for input(s): TSH, T4TOTAL, FREET4, T3FREE, THYROIDAB in the last 72 hours. Anemia Panel: No results for input(s): VITAMINB12, FOLATE, FERRITIN, TIBC, IRON, RETICCTPCT in the last 72 hours. Sepsis Labs: Recent Labs  Lab 03/02/21 2210 03/03/21 0439  LATICACIDVEN 0.9 0.7    Recent Results (from the past 240 hour(s))  Blood culture (routine x 2)     Status: None (Preliminary result)   Collection Time: 03/02/21  9:07 PM   Specimen: BLOOD  Result Value Ref Range Status   Specimen Description BLOOD RIGHT ASSIST CONTROL  Final   Special Requests   Final    BOTTLES DRAWN AEROBIC AND  ANAEROBIC Blood Culture adequate volume   Culture   Final    NO GROWTH 3 DAYS Performed at [facility], [address] [place], [place] [address]    Report Status PENDING  Incomplete  Blood culture (routine x 2)     Status: None (Preliminary result)   Collection Time: 03/02/21  9:07 PM   Specimen: BLOOD  Result Value Ref Range Status   Specimen Description BLOOD LEFT ASSIST CONTROL  Final   Special Requests   Final    BOTTLES DRAWN AEROBIC AND ANAEROBIC Blood Culture adequate volume   Culture   Final    NO GROWTH 3 DAYS Performed at [facility], [address] [place],  [address]    Report Status PENDING  Incomplete    Radiology Studies: No results found.  Scheduled Meds:  amLODipine  5 mg Oral Daily   apixaban  5 mg Oral BID   escitalopram  10 mg Oral Q breakfast   gabapentin  100 mg Oral TID   hydrALAZINE  100 mg Oral TID   insulin aspart  0-20 Units Subcutaneous TID WC   isosorbide mononitrate  30 mg Oral BID   pantoprazole  40 mg Oral Daily   rosuvastatin  10 mg Oral QHS   Continuous Infusions:  cefTRIAXone (ROCEPHIN)  IV 2 g (03/04/21 2150)     LOS: 3 days    Time spent: 35 mins    Adasia Hoar, MD [facility]   If 7PM-7AM, please contact night-coverage progre

## 2021-03-06 LAB — GLUCOSE, CAPILLARY
Glucose-Capillary: 140 mg/dL — ABNORMAL HIGH (ref 70–99)
Glucose-Capillary: 117 mg/dL — ABNORMAL HIGH (ref 70–99)
Glucose-Capillary: 184 mg/dL — ABNORMAL HIGH (ref 70–99)
Glucose-Capillary: 199 mg/dL — ABNORMAL HIGH (ref 70–99)

## 2021-03-06 LAB — BASIC METABOLIC PANEL
Anion gap: 9 (ref 5–15)
BUN: 38 mg/dL — ABNORMAL HIGH (ref 8–23)
CO2: 24 mmol/L (ref 22–32)
Calcium: 8 mg/dL — ABNORMAL LOW (ref 8.9–10.3)
Chloride: 108 mmol/L (ref 98–111)
Creatinine, Ser: 1.96 mg/dL — ABNORMAL HIGH (ref 0.61–1.24)
GFR, Estimated: 37 mL/min — ABNORMAL LOW (ref >60)
Glucose, Bld: 143 mg/dL — ABNORMAL HIGH (ref 70–99)
Potassium: 4.1 mmol/L (ref 3.5–5.1)
Sodium: 141 mmol/L (ref 135–145)

## 2021-03-06 LAB — CBC
HCT: 29.6 % — ABNORMAL LOW (ref 39.0–52.0)
Hemoglobin: 9.3 g/dL — ABNORMAL LOW (ref 13.0–17.0)
MCH: 26.1 pg (ref 26.0–34.0)
MCHC: 31.4 g/dL (ref 30.0–36.0)
MCV: 83.1 fL (ref 80.0–100.0)
Platelets: 211 10*3/uL (ref 150–400)
RBC: 3.56 MIL/uL — ABNORMAL LOW (ref 4.22–5.81)
RDW: 14.8 % (ref 11.5–15.5)
WBC: 6 10*3/uL (ref 4.0–10.5)
nRBC: 0 % (ref 0.0–0.2)

## 2021-03-06 LAB — PHOSPHORUS: Phosphorus: 3.3 mg/dL (ref 2.5–4.6)

## 2021-03-06 LAB — MAGNESIUM: Magnesium: 1.9 mg/dL (ref 1.7–2.4)

## 2021-03-06 MED ORDER — CEPHALEXIN 500 MG PO CAPS
500.0000 mg | ORAL_CAPSULE | Freq: Four times a day (QID) | ORAL | Status: DC
  Administered 2021-03-06 – 2021-03-07 (×4): 500 mg via ORAL
  Filled 2021-03-06 (×4): qty 1

## 2021-03-06 NOTE — Plan of Care (Signed)
  Problem: Clinical Measurements: Goal: Ability to maintain clinical measurements within normal limits will improve Outcome: Progressing Goal: Will remain free from infection Outcome: Progressing Goal: Diagnostic test results will improve Outcome: Progressing Goal: Respiratory complications will improve Outcome: Progressing Goal: Cardiovascular complication will be avoided Outcome: Progressing   Problem: Pain Managment: Goal: General experience of comfort will improve Outcome: Progressing   Pt is involved in and agrees with the plan of care. V/S stable. No complaints of pain. Dressing changed on right upper arm; has minimal drainage yellowish in color.

## 2021-03-06 NOTE — Consult Note (Signed)
Reason for Consult: Right arm cellulitis Referring Physician: Enzo Bi, MD (hospital medicine)  Larry Callahan is an 65 y.o. male.  HPI: He presented to the emergency department 5 days ago with right arm erythema, swelling, pruritus and pain.  He was thought to be a good candidate for outpatient treatment and was provided with a prescription for Omnicef.  He returned to the emergency room the following day when the home health nurse was concerned for extension of the redness.  He was admitted to hospital medicine and initiated on IV antibiotics.  Imaging was performed that included a plain film x-ray as well as ultrasound.  Changes consistent with cellulitis were appreciated and no drainable fluid collection identified.  He has had persistent clear fluid seepage from a small punctate area in the middle of the indurated area.  I was contacted today by hospital medicine to evaluate due to what was described as "pus" at the punctate opening.  [name] denies any fevers or chills.  He says that the redness and swelling in his arm is significantly improved; he says his entire arm was red and puffy and now it is just localized to an area surrounding the small opening in his skin.  He continues to be somewhat tender however.  He says that it is draining but he describes the drainage as watery and without odor.  Past Medical History:  Diagnosis Date   Allergy    Chronic combined systolic (congestive) and diastolic (congestive) heart failure ([place])    a. 04/2019 Echo: EF 45-50%; b. 02/2019 Echo: EF 55-60%; c. 09/2020 Echo: EF 35-40%, glob HK. Nl RV size/fxn. Mild MR.   CKD (chronic kidney disease), stage III (HCC)    COPD (chronic obstructive pulmonary disease) ([place])    Coronary artery disease    a. 2011 Cath: nonobs dzs; b. 09/2020 MV: EF 38%. No signif ischemia. ? inf MI vs diaph attenuation. GI uptake artifact.   Diabetes mellitus without complication ([place])    GERD (gastroesophageal reflux disease)     Hyperlipidemia    Hypertension    Morbid obesity ([place])    NICM (nonischemic cardiomyopathy) ([place])    a. 04/2019 Echo: EF 45-50%; b. 02/2019 Echo: EF 55-60%; c. 09/2020 Echo: EF 35-40%, glob HK; d. 09/2020 MV: No ischemia. ? inf infarct vs attenuation.   OSA (obstructive sleep apnea)    Patient Active Problem List   Diagnosis Date Noted   PAD (peripheral artery disease) ([place])    Cellulitis of right arm 03/02/2021   Chronic anticoagulation 03/02/2021   COPD with acute exacerbation ([place]) 01/29/2021   Atrial flutter ([place]) 01/07/2021   Syncope and collapse 11/16/2020   Demand ischemia ([place])    Influenza A 10/30/2020   HLD (hyperlipidemia) 10/06/2020   History of CVA (cerebrovascular accident) 09/25/2020   Gastroesophageal reflux disease without esophagitis 04/19/2020   Pneumonia due to COVID-19 virus 01/30/2020   Elevated troponin I level 01/29/2020   Suspected COVID-19 virus infection 01/29/2020   Lightheadedness 01/29/2020   Hematemesis 01/09/2020   Hospital discharge follow-up 11/26/2019   Generalized weakness 08/08/2019   Obesity (BMI 30-39.9) 08/08/2019   Acute right hip pain 06/17/2019   Inability to ambulate due to hip 06/17/2019   CKD stage 3 due to type 2 diabetes mellitus (HCC)    Hypervolemia    Type 2 diabetes mellitus with stage 3b chronic kidney disease, with long-term current use of insulin ([place])    Hypomagnesemia    Full code status 05/20/2019  Pleuritic chest pain    Multifocal pneumonia 05/19/2019   Acute kidney injury superimposed on CKD 3b ([place]) 05/09/2019   Hemoptysis 04/09/2019   Shortness of breath 03/26/2019   Uncontrolled type 2 diabetes mellitus with insulin therapy ([place]) 03/12/2019   Acute on chronic heart failure with preserved ejection fraction (HFpEF) (HCC)    Stage 3b chronic kidney disease ([place])    Chronic systolic CHF (congestive heart failure) ([place]) 79/89/2119   Diastolic dysfunction 41/74/0814   Iron deficiency anemia 12/31/2018   Atopic  dermatitis 11/24/2018   Screening for colon cancer 11/06/2017   Uncontrolled type 2 diabetes mellitus with hyperglycemia ([place]) 11/06/2017   Hidradenitis 06/09/2017   Atherosclerotic heart disease of native coronary artery without angina pectoris 06/09/2017   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   Obstructive sleep apnea, adult 06/09/2017   Morbid (severe) obesity due to excess calories ([place]) 06/09/2017   Cigarette nicotine dependence with nicotine-induced disorder 06/09/2017   Diabetic polyneuropathy associated with type 2 diabetes mellitus ([place]) 06/09/2017   Allergic rhinitis due to pollen 06/09/2017   Mixed hyperlipidemia 06/09/2017   COPD (chronic obstructive pulmonary disease) ([place]) 06/09/2017   Dyspnea on exertion 06/09/2017   Wheezing 06/09/2017   Dysuria 06/09/2017   Snoring 06/09/2017   Essential hypertension 06/09/2017   Personal history of other malignant neoplasm of kidney 06/09/2017   Pain in right hip 06/09/2017   Impacted cerumen, bilateral 06/09/2017   Secondary hyperparathyroidism, not elsewhere classified ([place]) 06/09/2017   Type 2 diabetes mellitus with hyperglycemia ([place]) 06/09/2017   Tinea corporis 06/09/2017     Past Surgical History:  Procedure Laterality Date   BACK SURGERY     CARDIAC CATHETERIZATION     CHOLECYSTECTOMY     COLONOSCOPY WITH PROPOFOL N/A 04/12/2019   Procedure: COLONOSCOPY WITH PROPOFOL;  Surgeon: [name], MD;  Location: [facility] ENDOSCOPY;  Service: Gastroenterology;  Laterality: N/A;   ESOPHAGOGASTRODUODENOSCOPY N/A 04/12/2019   Procedure: ESOPHAGOGASTRODUODENOSCOPY (EGD);  Surgeon: [name], MD;  Location: [facility]OSCOPY;  Service: Gastroenterology;  Laterality: N/A;   ESOPHAGOGASTRODUODENOSCOPY (EGD) WITH PROPOFOL N/A 11/21/2019   Procedure: ESOPHAGOGASTRODUODENOSCOPY (EGD) WITH PROPOFOL;  Surgeon: [name], MD;  [facility] ENDOSCOPY;  Service: Gastroenterology;   Laterality: N/A;   FLEXIBLE BRONCHOSCOPY Bilateral 05/17/2019   Procedure: FLEXIBLE BRONCHOSCOPY;  Surgeon: [name], MD;  Location: [name] ORS;  Service: Pulmonary;  Laterality: Bilateral;   left arm surgery     nephrectomy Left    PARATHYROIDECTOMY     RIGHT HEART CATH N/A 03/29/2019   Procedure: RIGHT HEART CATH;  Surgeon: [name], MD;  Location: [place] CV LAB;  Service: Cardiovascular;  Laterality: N/A;    Family History  Problem Relation Age of Onset   Diabetes Mother        2003   Lung cancer Father    Diabetes Sister    Hypertension Sister    Heart disease Sister    Cancer Sister    Bone cancer Brother     Social History:  reports that he has quit smoking. His smoking use included cigarettes. He has quit using smokeless tobacco.  His smokeless tobacco use included chew. He reports that he does not drink alcohol and does not use drugs.  Allergies:  Allergies  Allergen Reactions   Penicillins Rash    Medications: I have reviewed the patient's current medications.  Results for orders placed or performed during the hospital encounter of 03/02/21 (from the past 48 hour(s))  Glucose, capillary     Status: Abnormal   Collection Time: 03/04/21  4:39 PM  Result Value Ref Range   Glucose-Capillary 152 (H) 70 - 99 mg/dL    Comment: Glucose reference range applies only to samples taken after fasting for at least 8 hours.   Comment 1 Notify RN    Comment 2 Document in Chart   Glucose, capillary     Status: Abnormal   Collection Time: 03/04/21  9:14 PM  Result Value Ref Range   Glucose-Capillary 164 (H) 70 - 99 mg/dL    Comment: Glucose reference range applies only to samples taken after fasting for at least 8 hours.   Comment 1 Notify RN   Basic metabolic panel     Status: Abnormal   Collection Time: 03/05/21  4:49 AM  Result Value Ref Range   Sodium 137 135 - 145 mmol/L   Potassium 4.3 3.5 - 5.1 mmol/L   Chloride 108 98 - 111 mmol/L   CO2 21 (L) 22 -  32 mmol/L   Glucose, Bld 148 (H) 70 - 99 mg/dL    Comment: Glucose reference range applies only to samples taken after fasting for at least 8 hours.   BUN 45 (H) 8 - 23 mg/dL   Creatinine, Ser 2.12 (H) 0.61 - 1.24 mg/dL   Calcium 8.4 (L) 8.9 - 10.3 mg/dL   GFR, Estimated 33 (L) >60 mL/min    Comment: (NOTE) Calculated using the CKD-EPI Creatinine Equation (2021)    Anion gap 8 5 - 15    Comment: Performed at [facility], [address] [place], [place] [address]  Glucose, capillary     Status: Abnormal   Collection Time: 03/05/21  8:21 AM  Result Value Ref Range   Glucose-Capillary 137 (H) 70 - 99 mg/dL    Comment: Glucose reference range applies only to samples taken after fasting for at least 8 hours.  Glucose, capillary     Status: Abnormal   Collection Time: 03/05/21 11:23 AM  Result Value Ref Range   Glucose-Capillary 229 (H) 70 - 99 mg/dL    Comment: Glucose reference range applies only to samples taken after fasting for at least 8 hours.  Glucose, capillary     Status: Abnormal   Collection Time: 03/05/21  4:25 PM  Result Value Ref Range   Glucose-Capillary 102 (H) 70 - 99 mg/dL    Comment: Glucose reference range applies only to samples taken after fasting for at least 8 hours.  Glucose, capillary     Status: Abnormal   Collection Time: 03/05/21  8:01 PM  Result Value Ref Range   Glucose-Capillary 200 (H) 70 - 99 mg/dL    Comment: Glucose reference range applies only to samples taken after fasting for at least 8 hours.  CBC     Status: Abnormal   Collection Time: 03/06/21  4:13 AM  Result Value Ref Range   WBC 6.0 4.0 - 10.5 K/uL   RBC 3.56 (L) 4.22 - 5.81 MIL/uL   Hemoglobin 9.3 (L) 13.0 - 17.0 g/dL   HCT 29.6 (L) 39.0 - 52.0 %   MCV 83.1 80.0 - 100.0 fL   MCH 26.1 26.0 - 34.0 pg   MCHC 31.4 30.0 - 36.0 g/dL   RDW 14.8 11.5 - 15.5 %   Platelets 211 150 - 400 K/uL   nRBC 0.0 0.0 - 0.2 %    Comment: Performed at [facility], [address], [place], [place] [address]  Basic metabolic panel     Status: Abnormal   Collection Time: 03/06/21  4:13 AM  Result Value Ref Range   Sodium 141 135 - 145 mmol/L    Comment: ELECTROLYTES REPEATED TO VERIFY DLB   Potassium 4.1 3.5 - 5.1 mmol/L   Chloride 108 98 - 111 mmol/L   CO2 24 22 - 32 mmol/L   Glucose, Bld 143 (H) 70 - 99 mg/dL    Comment: Glucose reference range applies only to samples taken after fasting for at least 8 hours.   BUN 38 (H) 8 - 23 mg/dL   Creatinine, Ser 1.96 (H) 0.61 - 1.24 mg/dL   Calcium 8.0 (L) 8.9 - 10.3 mg/dL   GFR, Estimated 37 (L) >60 mL/min    Comment: (NOTE) Calculated using the CKD-EPI Creatinine Equation (2021)    Anion gap 9 5 - 15    Comment: Performed at [facility], [address] [place], [place] [address]  Phosphorus     Status: None   Collection Time: 03/06/21  4:13 AM  Result Value Ref Range   Phosphorus 3.3 2.5 - 4.6 mg/dL    Comment: Performed at [facility], [address], [place], [place] [address]  Magnesium     Status: None   Collection Time: 03/06/21  4:13 AM  Result Value Ref Range   Magnesium 1.9 1.7 - 2.4 mg/dL    Comment: Performed at [facility], [address] [place], [place] [address]  Glucose, capillary     Status: Abnormal   Collection Time: 03/06/21  8:12 AM  Result Value Ref Range   Glucose-Capillary 140 (H) 70 - 99 mg/dL    Comment: Glucose reference range applies only to samples taken after fasting for at least 8 hours.   Comment 1 Notify RN    Comment 2 Document in Chart   Glucose, capillary     Status: Abnormal   Collection Time: 03/06/21 11:39 AM  Result Value Ref Range   Glucose-Capillary 117 (H) 70 - 99 mg/dL    Comment: Glucose reference range applies only to samples taken after fasting for at least 8 hours.   Comment 1 Notify RN    Comment 2 Document in Chart     CLINICAL DATA:  Posterior right upper extremity swelling and redness   EXAM: ULTRASOUND RIGHT UPPER  EXTREMITY LIMITED   TECHNIQUE: Ultrasound examination of the upper extremity soft tissues was performed in the area of clinical concern.   COMPARISON:  None.   FINDINGS: Targeted ultrasound was performed of the mid to distal aspect of the posterior right upper arm and medial right forearm at site of patient's clinical concern. Subcutaneous edema and ill-defined fluid is present throughout the superficial soft tissues at these locations. No organized or drainable fluid collections.   IMPRESSION: Findings suggestive of cellulitis of the right upper extremity. No organized or drainable fluid collections.  Review of Systems  Genitourinary:  Positive for difficulty urinating.  All other systems reviewed and are negative. Or as discussed in the history of present illness.  Blood pressure (!) 141/68, pulse (!) 55, temperature (!) 97.5 F (36.4 C), temperature source Oral, resp. rate 18, height _0  (1.803 m), weight 122.5 kg, SpO2 96 %. Body mass index is 37.66 kg/m.  Physical Exam Constitutional:      General: He is not in acute distress.    Appearance: He is obese.  HENT:     Head: Normocephalic and atraumatic.  Eyes:  General: No scleral icterus.       Right eye: No discharge.        Left eye: No discharge.  Cardiovascular:     Rate and Rhythm: Regular rhythm. Bradycardia present.  Pulmonary:     Effort: Pulmonary effort is normal. No respiratory distress.  Abdominal:     Palpations: Abdomen is soft.  Genitourinary:    Comments: Deferred Musculoskeletal:     Right upper arm: Swelling and edema present.     Right elbow: No effusion.       Arms:     Comments: Area of induration and erythema appreciated on the patient's posterior upper arm.  There is a punctate opening with fibrinous exudate.  The wound is weeping clear serous fluid.  No fluid collection appreciated and no pus able to be expressed.  Skin:    General: Skin is warm and dry.  Neurological:      General: No focal deficit present.     Mental Status: He is alert.  Psychiatric:        Mood and Affect: Mood normal.        Behavior: Behavior normal.    Assessment/Plan: This is a 65 year old man admitted to the hospital with right arm cellulitis.  General surgery was consulted for the possibility of an abscess.  On physical examination as well as on imaging, however, there is no drainable fluid collection.  Despite multiple notes documenting purulent drainage, none is seen on exam today and all of the drainage appears to be serous fluid.  There is no indication for surgical intervention.  Continue IV antibiotics.  Consider elevating the limb and applying warm compresses to the indurated site.  General impression is that of an infected insect bite or sting.  Discussed with attending provider. Fredirick Maudlin 03/06/2021, 4:14 PM

## 2021-03-06 NOTE — Progress Notes (Signed)
PROGRESS NOTE    Larry Callahan  YTK:[id number] DOB: Sep 14, 1955 DOA: 03/02/2021 PCP: Lavera Guise, MD    Brief Narrative:  This  65 y.o. male with a PMH significant for CAD with moderate risk Myoview on 09/2020, CKD 3B, HTN, DM, COPD, OSA on CPAP, systolic heart failure, last LVEF 35 to 40% 4/22, atrial flutter on Eliquis presented to the ED on 03/02/2021 with R arm infection not improving on home PO antibiotics. In the ED, he ws found that he had cellulitis of RUE. He was treated with IV Abx.  Patient is admitted for right arm cellulitis and PAD cellulitis of foot.  Assessment & Plan:   Principal Problem:   Cellulitis of right arm Active Problems:   Obstructive sleep apnea, adult   COPD (chronic obstructive pulmonary disease) (HCC)   Essential hypertension   Chronic systolic CHF (congestive heart failure) (HCC)   Acute kidney injury superimposed on CKD 3b (HCC)   Type 2 diabetes mellitus with stage 3b chronic kidney disease, with long-term current use of insulin (HCC)   Atrial flutter (HCC)   Chronic anticoagulation  Right upper arm cellulitis: afebrile.  No leukocytosis, blood cultures No growth so far. There is no significant fluid collection on ultrasound on presentation. --received 7 days of ceftriaxone --clear liquid drainage with white material in wound opening Plan: --GenSurg consult today, confirmed no abscess to drain --cont abx   AKI on CKDIII --Cr improved --encourage oral hydration  Atrial flutter: Heart rate remains stable --cont coreg and Eliquis   Obstructive sleep apnea: Continue CPAP at night   Hypertension : --cont amlodipine, hydralazine and Imdur  COPD- stable.   T2DM - home meds include: glimepiride, 26u glargine daily.  Hgb A1c was 6.2 in July so below goal of 7. --cont SSI    Depression --cont Lexapro    DVT prophylaxis: Eliquis Code Status: Full code Family Communication: Disposition Plan:   The patient is from:  home Anticipated d/c is to: home Anticipated d/c date is: likely tomorrow   Consultants:  Vascular surgery  Procedures: None Antimicrobials:   Anti-infectives (From admission, onward)    Start     Dose/Rate Route Frequency Ordered Stop   03/06/21 1400  cephALEXin (KEFLEX) capsule 500 mg        500 mg Oral 4 times daily 03/06/21 1221     03/03/21 2200  cefTRIAXone (ROCEPHIN) 1 g in sodium chloride 0.9 % 100 mL IVPB  Status:  Discontinued        1 g 200 mL/hr over 30 Minutes Intravenous Every 24 hours 03/02/21 2302 03/03/21 2045   03/02/21 2145  cefTRIAXone (ROCEPHIN) 2 g in sodium chloride 0.9 % 100 mL IVPB  Status:  Discontinued        2 g 200 mL/hr over 30 Minutes Intravenous Every 24 hours 03/02/21 2131 03/06/21 1220       Subjective: Pt reported fluid draining from right upper arm cellulitis.    GenSurg consulted and confirmed no abscess that needs to be drained.   Objective: Vitals:   03/06/21 0810 03/06/21 0939 03/06/21 0945 03/06/21 1511  BP: (!) 160/67   (!) 141/68  Pulse: (!) 52 (!) 55  (!) 55  Resp: 19   18  Temp: (!) 97.4 F (36.3 C)   (!) 97.5 F (36.4 C)  TempSrc: Oral   Oral  SpO2: 95% 98% 96% 96%  Weight:      Height:        Intake/Output Summary (Last  24 hours) at 03/06/2021 1616 Last data filed at 03/06/2021 1418 Gross per 24 hour  Intake 480 ml  Output 1250 ml  Net -770 ml   Filed Weights   03/03/21 2250  Weight: 122.5 kg    Examination:  Constitutional: NAD, AAOx3, sitting at side of bed HEENT: conjunctivae and lids normal, EOMI CV: No cyanosis.   RESP: normal respiratory effort, on RA Extremities: No effusions, edema in BLE SKIN: warm.  Clear fluid draining from right upper arm cellulitis with white material at central opening Neuro: II - XII grossly intact.   Psych: Normal mood and affect.  Appropriate judgement and reason   Data Reviewed: I have personally reviewed following labs and imaging studies  CBC: Recent Labs  Lab  03/01/21 1125 03/02/21 1525 03/04/21 0533 03/06/21 0413  WBC 9.4 7.6 5.8 6.0  HGB 10.5* 10.0* 9.4* 9.3*  HCT 32.5* 30.3* 28.7* 29.6*  MCV 84.9 82.8 83.7 83.1  PLT 183 182 182 322   Basic Metabolic Panel: Recent Labs  Lab 03/01/21 1125 03/02/21 1525 03/04/21 0533 03/05/21 0449 03/06/21 0413  NA 136 136 134* 137 141  K 4.0 4.6 4.2 4.3 4.1  CL 103 106 105 108 108  CO2 23 21* 19* 21* 24  GLUCOSE 240* 98 146* 148* 143*  BUN 38* 50* 51* 45* 38*  CREATININE 2.45* 2.82* 2.52* 2.12* 1.96*  CALCIUM 8.1* 8.6* 8.7* 8.4* 8.0*  MG  --   --   --   --  1.9  PHOS  --   --   --   --  3.3   GFR: Estimated Creatinine Clearance: 48.7 mL/min (A) (by C-G formula based on SCr of 1.96 mg/dL (H)). Liver Function Tests: Recent Labs  Lab 03/04/21 0533  AST 26  ALT 24  ALKPHOS 62  BILITOT 0.4  PROT 6.3*  ALBUMIN 2.8*   No results for input(s): LIPASE, AMYLASE in the last 168 hours. No results for input(s): AMMONIA in the last 168 hours. Coagulation Profile: No results for input(s): INR, PROTIME in the last 168 hours. Cardiac Enzymes: No results for input(s): CKTOTAL, CKMB, CKMBINDEX, TROPONINI in the last 168 hours. BNP (last 3 results) No results for input(s): PROBNP in the last 8760 hours. HbA1C: No results for input(s): HGBA1C in the last 72 hours. CBG: Recent Labs  Lab 03/05/21 1123 03/05/21 1625 03/05/21 2001 03/06/21 0812 03/06/21 1139  GLUCAP 229* 102* 200* 140* 117*   Lipid Profile: No results for input(s): CHOL, HDL, LDLCALC, TRIG, CHOLHDL, LDLDIRECT in the last 72 hours. Thyroid Function Tests: No results for input(s): TSH, T4TOTAL, FREET4, T3FREE, THYROIDAB in the last 72 hours. Anemia Panel: No results for input(s): VITAMINB12, FOLATE, FERRITIN, TIBC, IRON, RETICCTPCT in the last 72 hours. Sepsis Labs: Recent Labs  Lab 03/02/21 2210 03/03/21 0439  LATICACIDVEN 0.9 0.7    Recent Results (from the past 240 hour(s))  Blood culture (routine x 2)     Status:  None (Preliminary result)   Collection Time: 03/02/21  9:07 PM   Specimen: BLOOD  Result Value Ref Range Status   Specimen Description BLOOD RIGHT ASSIST CONTROL  Final   Special Requests   Final    BOTTLES DRAWN AEROBIC AND ANAEROBIC Blood Culture adequate volume   Culture   Final    NO GROWTH 4 DAYS Performed at [facility], [address] [place], [place] [address]    Report Status PENDING  Incomplete  Blood culture (routine x 2)     Status:  None (Preliminary result)   Collection Time: 03/02/21  9:07 PM   Specimen: BLOOD  Result Value Ref Range Status   Specimen Description BLOOD LEFT ASSIST CONTROL  Final   Special Requests   Final    BOTTLES DRAWN AEROBIC AND ANAEROBIC Blood Culture adequate volume   Culture   Final    NO GROWTH 4 DAYS Performed at [facility], [address], [place], [place] [address]    Report Status PENDING  Incomplete    Radiology Studies: No results found.  Scheduled Meds:  amLODipine  5 mg Oral Daily   apixaban  5 mg Oral BID   cephALEXin  500 mg Oral QID   escitalopram  10 mg Oral Q breakfast   gabapentin  100 mg Oral TID   hydrALAZINE  100 mg Oral TID   insulin aspart  0-20 Units Subcutaneous TID WC   isosorbide mononitrate  30 mg Oral BID   pantoprazole  40 mg Oral Daily   rosuvastatin  10 mg Oral QHS   Continuous Infusions:     LOS: 4 days     Enzo Bi, MD [facility]   If 7PM-7AM, please contact night-coverage progre

## 2021-03-07 LAB — CULTURE, BLOOD (ROUTINE X 2)
Culture: NO GROWTH
Culture: NO GROWTH
Special Requests: ADEQUATE
Special Requests: ADEQUATE

## 2021-03-07 LAB — BASIC METABOLIC PANEL
Anion gap: 8 (ref 5–15)
BUN: 32 mg/dL — ABNORMAL HIGH (ref 8–23)
CO2: 23 mmol/L (ref 22–32)
Calcium: 8.9 mg/dL (ref 8.9–10.3)
Chloride: 107 mmol/L (ref 98–111)
Creatinine, Ser: 1.87 mg/dL — ABNORMAL HIGH (ref 0.61–1.24)
GFR, Estimated: 39 mL/min — ABNORMAL LOW (ref >60)
Glucose, Bld: 289 mg/dL — ABNORMAL HIGH (ref 70–99)
Potassium: 4.1 mmol/L (ref 3.5–5.1)
Sodium: 138 mmol/L (ref 135–145)

## 2021-03-07 LAB — CBC
HCT: 34.8 % — ABNORMAL LOW (ref 39.0–52.0)
Hemoglobin: 10.8 g/dL — ABNORMAL LOW (ref 13.0–17.0)
MCH: 26 pg (ref 26.0–34.0)
MCHC: 31 g/dL (ref 30.0–36.0)
MCV: 83.7 fL (ref 80.0–100.0)
Platelets: 256 10*3/uL (ref 150–400)
RBC: 4.16 MIL/uL — ABNORMAL LOW (ref 4.22–5.81)
RDW: 14.8 % (ref 11.5–15.5)
WBC: 8.3 10*3/uL (ref 4.0–10.5)
nRBC: 0 % (ref 0.0–0.2)

## 2021-03-07 LAB — MAGNESIUM: Magnesium: 1.9 mg/dL (ref 1.7–2.4)

## 2021-03-07 LAB — GLUCOSE, CAPILLARY: Glucose-Capillary: 160 mg/dL — ABNORMAL HIGH (ref 70–99)

## 2021-03-07 MED ORDER — POTASSIUM CHLORIDE CRYS ER 20 MEQ PO TBCR: 20.0000 meq | EXTENDED_RELEASE_TABLET | Freq: Two times a day (BID) | ORAL | 1 refills | Status: DC | PRN

## 2021-03-07 MED ORDER — INSULIN GLARGINE (1 UNIT DIAL) 300 UNIT/ML ~~LOC~~ SOPN: 10.0000 [IU] | PEN_INJECTOR | Freq: Every day | SUBCUTANEOUS | 3 refills | Status: DC

## 2021-03-07 NOTE — Plan of Care (Signed)

## 2021-03-07 NOTE — TOC Transition Note (Signed)
Transition of Care [facility]) - CM/SW Discharge Note   Patient Details  Name: Larry Callahan MRN: [id number] Date of Birth: 1955-10-23  [facility] of [facility]) CM/SW Contact:  Izola Price, RN Phone Number: 03/07/2021, 11:54 AM   Clinical Narrative:  Discharge to home/HH with [facility]. Has DME at home. RN added to new PT/OT HH orders for wound assessment/drainage. VM left at [facility] intake to advice of DC today. Also confirmed on 1/75 via [place] and prior CM. Niece will transport patient home today. Patient confirmed already having [place] services and explained new orders are for same agency with RN added. He had no further questions or concerns. Simmie Davies RN CM      Final next level of care: [place] (Open with [employer] per [place] on 1/02. Left intake VM re discharge/orders.) Barriers to Discharge: Barriers Resolved   Patient Goals and CMS Choice Patient states their goals for this hospitalization and ongoing recovery are:: Go home.      Discharge Placement                       Discharge Plan and Services                DME Arranged: N/A DME Agency: NA (Has DME at home.)       HH Arranged: RN, PT, OT (Left VM on [facility] Weekend intake VM.) HH Agency: [facility] Date [place]: 03/05/21 Time [place]: 5852 (Left update voice mail on weekend intake number re discharge, provider added RN to PT/OT) Representative spoke with at [place]: [place] (SDOH) Interventions     Readmission Risk Interventions Readmission Risk Prevention Plan 03/05/2021 02/01/2020 01/31/2020  Transportation Screening Complete Complete Complete  Medication Review Press photographer) Complete Complete Complete  PCP or Specialist appointment within 3-5 days of discharge Complete Complete Complete  HRI or Home Care Consult Complete Complete Complete  SW Recovery Care/Counseling Consult - Complete Complete  SW  Consult Not Complete Comments - - -  Palliative Care Screening Not Applicable Not Applicable Not [place] Not Applicable Not Applicable Not Applicable  Some recent data might be hidden

## 2021-03-07 NOTE — Discharge Summary (Signed)
Physician Discharge Summary   Larry Callahan  male DOB: 1956-02-05  TMH:[id number]  PCP: Lavera Guise, MD  Admit date: 03/02/2021 Discharge date: 03/07/2021  Admitted From: home Disposition:  home Home Health: Yes CODE STATUS: Full code  Discharge Instructions     Discharge instructions   Complete by: As directed    You have received 4 days of IV antibiotic for your right upper arm cellulitis.  Please continue to take cefdinir (OMNICEF) once daily starting today 03/07/21 until you finish all the pills.    You have clear fluid draining from your cellulitis because of tissue swelling.  Please keep gauze dressing over it and change as needed until the draining stops.  As we discussed, your insulin TOUJEO at 26 units daily is likely too high.  I have lowered it to 10 units daily.  Please follow up with your primary care doctor for further adjustment.   Dr. Enzo Bi [facility] Course:  For full details, please see H&P, progress notes, consult notes and ancillary notes.  Briefly,  Larry Callahan is a 65 y.o. male with a PMH significant for CAD with moderate risk Myoview on 09/2020, CKD 3B, HTN, DM, COPD, OSA on CPAP, systolic heart failure, last LVEF 35 to 40% 4/22, atrial flutter on Eliquis presented to the ED with R arm infection not improving on home PO antibiotics.    Right upper arm cellulitis: --afebrile.  No leukocytosis, blood cultures No growth so far. --US showed Subcutaneous edema and ill-defined fluid present throughout the superficial soft tissues at these locations. No organized or drainable fluid collections. --after 4 days of ceftriaxone, clear liquid drainage with white material in wound opening were noted. --GenSurg consulted, confirmed no abscess to drain --Pt was discharged to finish abx course with cefdinir (pt already had this Rx filled prior to presentation).   AKI on CKDIII --Cr peaked at 2.82 on 9/20.  Improved to 1.87 prior  to discharge.   Atrial flutter: Heart rate remains stable --cont coreg and Eliquis   Obstructive sleep apnea: Continue CPAP at night   Hypertension : --cont amlodipine, hydralazine and Imdur   COPD- stable.    T2DM - home meds include: glimepiride, 26u glargine daily.  Hgb A1c was 6.2 in July, so below goal of 7. --Pt was discharged on reduced dose of Toujeo at 10u daily.   Depression --cont Lexapro    Discharge Diagnoses:  Principal Problem:   Cellulitis of right arm Active Problems:   Obstructive sleep apnea, adult   COPD (chronic obstructive pulmonary disease) (HCC)   Essential hypertension   Chronic systolic CHF (congestive heart failure) (HCC)   Acute kidney injury superimposed on CKD 3b (HCC)   Type 2 diabetes mellitus with stage 3b chronic kidney disease, with long-term current use of insulin (HCC)   Atrial flutter (HCC)   Chronic anticoagulation   30 Day Unplanned Readmission Risk Score    Flowsheet Row ED to Hosp-Admission (Current) from 03/02/2021 in [facility]  30 Day Unplanned Readmission Risk Score (%) 61.24 Filed at 03/07/2021 0801       This score is the patient's risk of an unplanned readmission within 30 days of being discharged (0 -100%). The score is based on dignosis, age, lab data, medications, orders, and past utilization.   Low:  0-14.9   Medium: 15-21.9   High: 22-29.9   Extreme: 30 and above  Discharge Instructions:  Allergies as of 03/07/2021       Reactions   Penicillins Rash        Medication List     TAKE these medications    albuterol 108 (90 Base) MCG/ACT inhaler Commonly known as: VENTOLIN HFA Inhale 2 puffs into the lungs every 6 (six) hours as needed for wheezing or shortness of breath.   amLODipine 5 MG tablet Commonly known as: NORVASC Take 1 tablet (5 mg total) by mouth daily.   apixaban 2.5 MG Tabs tablet Commonly known as: ELIQUIS Take 1 tablet (2.5 mg total)  by mouth 2 (two) times daily.   Breztri Aerosphere 160-9-4.8 MCG/ACT Aero Generic drug: Budeson-Glycopyrrol-Formoterol Inhale 2 puffs into the lungs in the morning and at bedtime.   carvedilol 12.5 MG tablet Commonly known as: Coreg Take 1 tablet (12.5 mg total) by mouth 2 (two) times daily.   cefdinir 300 MG capsule Commonly known as: OMNICEF Take 1 capsule (300 mg total) by mouth daily for 7 days.   cetirizine 10 MG tablet Commonly known as: ZYRTEC Take 10 mg by mouth daily.   cyanocobalamin 1000 MCG tablet Take 1 tablet (1,000 mcg total) by mouth daily.   escitalopram 10 MG tablet Commonly known as: Lexapro Take one tab po qd for anxiety with food   furosemide 40 MG tablet Commonly known as: LASIX Take one to tabs once or twice days for fluid // swelling   gabapentin 100 MG capsule Commonly known as: NEURONTIN Take 1 capsule (100 mg total) by mouth in the morning, at noon, and at bedtime.   glimepiride 2 MG tablet Commonly known as: AMARYL Take one tab po qd with supper   hydrALAZINE 100 MG tablet Commonly known as: APRESOLINE Take 1 tablet (100 mg total) by mouth 3 (three) times daily.   insulin glargine (1 Unit Dial) 300 UNIT/ML Solostar Pen Commonly known as: TOUJEO Inject 10 Units into the skin daily. Reduced from prior 26 units daily. What changed:  how much to take additional instructions   ipratropium-albuterol 0.5-2.5 (3) MG/3ML Soln Commonly known as: DUONEB Take 3 mLs by nebulization every 6 (six) hours as needed.   isosorbide mononitrate 30 MG 24 hr tablet Commonly known as: IMDUR Take 1 tablet (30 mg total) by mouth 2 (two) times daily.   meclizine 25 MG tablet Commonly known as: ANTIVERT Take 1 tablet (25 mg total) by mouth 3 (three) times daily as needed for dizziness or nausea.   Novofine Pen Needle 32G X 6 MM Misc Generic drug: Insulin Pen Needle Use as directed   omeprazole 40 MG capsule Commonly known as: PRILOSEC Take 1 capsule  (40 mg total) by mouth in the morning and at bedtime.   potassium chloride SA 20 MEQ tablet Commonly known as: KLOR-CON Take 1 tablet (20 mEq total) by mouth 2 (two) times daily as needed. When you take Lasix. What changed:  when to take this reasons to take this additional instructions   rosuvastatin 10 MG tablet Commonly known as: CRESTOR Take 1 tablet (10 mg total) by mouth at bedtime.   True Metrix Air Glucose Meter Devi 1 Device by Does not apply route 2 (two) times daily.   True Metrix Blood Glucose Test test strip Generic drug: glucose blood Use as instructed twice daily diag E11.65   True Metrix Level 1 Low Soln Use as directed   TRUEplus Lancets 33G Misc Use as directed twice daily diag E11.65  Follow-up Information     Lavera Guise, MD Follow up in 1 week(s).   Specialties: Internal Medicine, Cardiology Contact information: [address] [place] [address] [phone]                 Allergies  Allergen Reactions   Penicillins Rash     The results of significant diagnostics from this hospitalization (including imaging, microbiology, ancillary and laboratory) are listed below for reference.   Consultations:   Procedures/Studies: DG Chest 2 View  Result Date: 02/18/2021 CLINICAL DATA:  Three days of right-sided rib/chest pain. Shortness of breath. EXAM: CHEST - 2 VIEW COMPARISON:  Radiograph and CT January 28, 2021 FINDINGS: Patient is rotated. Similar cardiac enlargement, least in part accentuated by technique. Pulmonary vascular congestion. No overt pulmonary edema. Tortuous thoracic aorta. No focal consolidation. No visible pleural effusion or pneumothorax. The visualized skeletal structures are unchanged. IMPRESSION: Similar cardiac enlargement and pulmonary vascular congestion without overt pulmonary edema or focal consolidation. Electronically Signed   By: Dahlia Bailiff M.D.   On: 02/18/2021 18:07   DG Elbow Complete  Right  Result Date: 03/02/2021 CLINICAL DATA:  Elbow cellulitis EXAM: RIGHT ELBOW - COMPLETE 3+ VIEW COMPARISON:  None. FINDINGS: No acute fracture or dislocation is noted. Minimal elevation of the anterior fat pad is noted which may be related to a small joint effusion. Considerable soft tissue swelling is noted particularly posteriorly and along the medial and lateral aspects of the elbow consistent with the known history of cellulitis. IMPRESSION: Changes consistent with cellulitis. No acute bony abnormality is noted. Electronically Signed   By: Inez Catalina M.D.   On: 03/02/2021 21:59   CT HEAD WO CONTRAST (5MM)  Result Date: 02/18/2021 CLINICAL DATA:  Numbness. EXAM: CT HEAD WITHOUT CONTRAST TECHNIQUE: Contiguous axial images were obtained from the base of the skull through the vertex without intravenous contrast. COMPARISON:  November 24, 2020. FINDINGS: Brain: No evidence of acute infarction, hemorrhage, hydrocephalus, extra-axial collection or mass lesion/mass effect. Vascular: No hyperdense vessel or unexpected calcification. Skull: Normal. Negative for fracture or focal lesion. Sinuses/Orbits: No acute finding. Other: None. IMPRESSION: No acute intracranial abnormality seen. Electronically Signed   By: Marijo Conception M.D.   On: 02/18/2021 18:53   MR BRAIN WO CONTRAST  Result Date: 02/18/2021 CLINICAL DATA:  Initial evaluation for neuro deficit, stroke suspected. EXAM: MRI HEAD WITHOUT CONTRAST TECHNIQUE: Multiplanar, multiecho pulse sequences of the brain and surrounding structures were obtained without intravenous contrast. COMPARISON:  CT from earlier the same day. FINDINGS: Brain: Cerebral volume within normal limits. Mild chronic microvascular ischemic disease noted involving the periventricular white matter. No abnormal foci of restricted diffusion to suggest acute or subacute ischemia. Gray-white matter differentiation maintained. No areas of remote cortical infarction. No acute intracranial  hemorrhage. Single punctate chronic microhemorrhage noted at the right parietal lobe, of doubtful significance in isolation. No mass lesion, midline shift or mass effect. No hydrocephalus or extra-axial fluid collection. Pituitary gland suprasellar region within normal limits. Midline structures intact. Vascular: Major intracranial vascular flow voids are maintained. Skull and upper cervical spine: Craniocervical junction within normal limits. Bone marrow signal intensity within normal limits. No scalp soft tissue abnormality. Sinuses/Orbits: Globes and orbital soft tissues demonstrate no acute finding. Paranasal sinuses are largely clear. Small possible dentigerous cysts small left greater than right mastoid effusions. Visualized nasopharynx unremarkable. Inner ear structures grossly normal. Noted at the maxilla, of doubtful significance. Other: None. IMPRESSION: 1. No acute intracranial abnormality. 2. Mild chronic  microvascular ischemic disease for age. Electronically Signed   By: Jeannine Boga M.D.   On: 02/18/2021 21:54   DG Chest Portable 1 View  Result Date: 03/02/2021 CLINICAL DATA:  Chest pain EXAM: PORTABLE CHEST 1 VIEW COMPARISON:  02/18/2021 FINDINGS: Cardiac shadow remains enlarged. Lungs are well aerated bilaterally. Mild vascular congestion is seen without focal infiltrate. No bony abnormality is noted. IMPRESSION: Mild vascular congestion without focal infiltrate. Electronically Signed   By: Inez Catalina M.D.   On: 03/02/2021 22:00   [name] RT UPPER EXTREM LTD SOFT TISSUE NON VASCULAR  Result Date: 03/01/2021 CLINICAL DATA:  Posterior right upper extremity swelling and redness EXAM: ULTRASOUND RIGHT UPPER EXTREMITY LIMITED TECHNIQUE: Ultrasound examination of the upper extremity soft tissues was performed in the area of clinical concern. COMPARISON:  None. FINDINGS: Targeted ultrasound was performed of the mid to distal aspect of the posterior right upper arm and medial right forearm at  site of patient's clinical concern. Subcutaneous edema and ill-defined fluid is present throughout the superficial soft tissues at these locations. No organized or drainable fluid collections. IMPRESSION: Findings suggestive of cellulitis of the right upper extremity. No organized or drainable fluid collections. Electronically Signed   By: Davina Poke D.O.   On: 03/01/2021 15:16      Labs: BNP (last 3 results) Recent Labs    10/31/20 1900 01/29/21 0641 02/18/21 1705  BNP 37.7 83.5 89.3   Basic Metabolic Panel: Recent Labs  Lab 03/02/21 1525 03/04/21 0533 03/05/21 0449 03/06/21 0413 03/07/21 0925  NA 136 134* 137 141 138  K 4.6 4.2 4.3 4.1 4.1  CL 106 105 108 108 107  CO2 21* 19* 21* 24 23  GLUCOSE 98 146* 148* 143* 289*  BUN 50* 51* 45* 38* 32*  CREATININE 2.82* 2.52* 2.12* 1.96* 1.87*  CALCIUM 8.6* 8.7* 8.4* 8.0* 8.9  MG  --   --   --  1.9 1.9  PHOS  --   --   --  3.3  --    Liver Function Tests: Recent Labs  Lab 03/04/21 0533  AST 26  ALT 24  ALKPHOS 62  BILITOT 0.4  PROT 6.3*  ALBUMIN 2.8*   No results for input(s): LIPASE, AMYLASE in the last 168 hours. No results for input(s): AMMONIA in the last 168 hours. CBC: Recent Labs  Lab 03/01/21 1125 03/02/21 1525 03/04/21 0533 03/06/21 0413 03/07/21 0925  WBC 9.4 7.6 5.8 6.0 8.3  HGB 10.5* 10.0* 9.4* 9.3* 10.8*  HCT 32.5* 30.3* 28.7* 29.6* 34.8*  MCV 84.9 82.8 83.7 83.1 83.7  PLT 183 182 182 211 256   Cardiac Enzymes: No results for input(s): CKTOTAL, CKMB, CKMBINDEX, TROPONINI in the last 168 hours. BNP: Invalid input(s): POCBNP CBG: Recent Labs  Lab 03/06/21 0812 03/06/21 1139 03/06/21 1650 03/06/21 2102 03/07/21 0737  GLUCAP 140* 117* 184* 199* 160*   D-Dimer No results for input(s): DDIMER in the last 72 hours. Hgb A1c No results for input(s): HGBA1C in the last 72 hours. Lipid Profile No results for input(s): CHOL, HDL, LDLCALC, TRIG, CHOLHDL, LDLDIRECT in the last 72  hours. Thyroid function studies No results for input(s): TSH, T4TOTAL, T3FREE, THYROIDAB in the last 72 hours.  Invalid input(s): FREET3 Anemia work up No results for input(s): VITAMINB12, FOLATE, FERRITIN, TIBC, IRON, RETICCTPCT in the last 72 hours. Urinalysis    Component Value Date/Time   COLORURINE YELLOW 03/02/2021 1525   APPEARANCEUR CLEAR 03/02/2021 1525   APPEARANCEUR Cloudy (A) 11/23/2018 1103  LABSPEC 1.020 03/02/2021 1525   LABSPEC 1.005 03/06/2013 0929   PHURINE 5.5 03/02/2021 1525   GLUCOSEU NEGATIVE 03/02/2021 1525   GLUCOSEU Negative 03/06/2013 0929   HGBUR NEGATIVE 03/02/2021 1525   BILIRUBINUR NEGATIVE 03/02/2021 1525   BILIRUBINUR negative 02/20/2019 1404   BILIRUBINUR Negative 11/23/2018 1103   BILIRUBINUR Negative 03/06/2013 0929   KETONESUR NEGATIVE 03/02/2021 1525   PROTEINUR >300 (A) 03/02/2021 1525   UROBILINOGEN 0.2 02/20/2019 1404   NITRITE NEGATIVE 03/02/2021 1525   LEUKOCYTESUR NEGATIVE 03/02/2021 1525   LEUKOCYTESUR Negative 03/06/2013 0929   Sepsis Labs Invalid input(s): PROCALCITONIN,  WBC,  LACTICIDVEN Microbiology Recent Results (from the past 240 hour(s))  Blood culture (routine x 2)     Status: None   Collection Time: 03/02/21  9:07 PM   Specimen: BLOOD  Result Value Ref Range Status   Specimen Description BLOOD RIGHT ASSIST CONTROL  Final   Special Requests   Final    BOTTLES DRAWN AEROBIC AND ANAEROBIC Blood Culture adequate volume   Culture   Final    NO GROWTH 5 DAYS Performed at [facility], [address] [place], [place] [address]    Report Status 03/07/2021 FINAL  Final  Blood culture (routine x 2)     Status: None   Collection Time: 03/02/21  9:07 PM   Specimen: BLOOD  Result Value Ref Range Status   Specimen Description BLOOD LEFT ASSIST CONTROL  Final   Special Requests   Final    BOTTLES DRAWN AEROBIC AND ANAEROBIC Blood Culture adequate volume   Culture   Final    NO GROWTH 5 DAYS Performed at  [facility], [address] [place], [place] [address]    Report Status 03/07/2021 FINAL  Final     Total time spend on discharging this patient, including the last patient exam, discussing the hospital stay, instructions for ongoing care as it relates to all pertinent caregivers, as well as preparing the medical discharge records, prescriptions, and/or referrals as applicable, is 45 minutes.    Enzo Bi, MD  [facility] 03/07/2021, 11:04 AM

## 2021-03-07 NOTE — TOC Progression Note (Signed)
Transition of Care [facility]) - Progression Note    Patient Details  Name: Kasim Mccorkle MRN: [id number] Date of Birth: 1956/05/14  [facility]) CM/SW Contact  Izola Price, RN Phone Number: 03/07/2021, 11:51 AM  Clinical Narrative:  Patient discharging today. Left VM with [facility] weekend intake number. Requested new HH order per provider and RN added to PT/OT in place with [facility]. RN to check on wound/drainage. Prior CM had also notified [facility] via [place] on 3/41/93. Has DME at home and PCP confirmed in prior assessment.   Simmie Davies RN CM    Expected Discharge Plan: [place] Barriers to Discharge: Barriers Resolved  Expected Discharge Plan and Services Expected Discharge Plan: [place]         Expected Discharge Date: 03/07/21               DME Arranged: N/A DME Agency: NA (Has DME at home.)       HH Arranged: RN, PT, OT (Left VM on [facility] Weekend intake VM.) HH Agency: [facility] Date [facility] Agency Contacted: 03/05/21 Time [place]: [phone] (Left update voice mail on weekend intake number re discharge, provider added RN to PT/OT) Representative spoke with at [place]: [place] Determinants of Health (SDOH) Interventions    Readmission Risk Interventions Readmission Risk Prevention Plan 03/05/2021 02/01/2020 01/31/2020  Transportation Screening Complete Complete Complete  Medication Review Press photographer) Complete Complete Complete  PCP or Specialist appointment within 3-5 days of discharge Complete Complete Complete  HRI or Home Care Consult Complete Complete Complete  SW Recovery Care/Counseling Consult - Complete Complete  SW Consult Not Complete Comments - - -  Palliative Care Screening Not Applicable Not Applicable Not [place] Not Applicable Not Applicable Not Applicable  Some recent data might be hidden

## 2021-03-07 NOTE — Progress Notes (Signed)
AVS given to patient, dressing supplies provided, IV out, pt. Verified that he doesn't need meds, to continue meds from fome.

## 2021-03-08 ENCOUNTER — Telehealth: Payer: Self-pay

## 2021-03-08 NOTE — Telephone Encounter (Signed)
Left vm to schedule hospital f/u-tw

## 2021-03-10 ENCOUNTER — Ambulatory Visit: Payer: Medicare PPO | Admitting: Nurse Practitioner

## 2021-03-11 NOTE — Telephone Encounter (Signed)
Patient refused hospital f/u due to transportation. He stated he will just wait until 03/23/21 visit-[name]

## 2021-03-15 ENCOUNTER — Telehealth: Payer: Self-pay

## 2021-03-15 NOTE — Telephone Encounter (Signed)
Larry Callahan [phone], calling for verbal orders from the hospital, disease management, COPD, and diabetes, 1 time a week for 6 weeks. Verbal order confirmed

## 2021-03-16 DIAGNOSIS — N1832 Chronic kidney disease, stage 3b: Secondary | ICD-10-CM | POA: Diagnosis not present

## 2021-03-16 DIAGNOSIS — J441 Chronic obstructive pulmonary disease with (acute) exacerbation: Secondary | ICD-10-CM | POA: Diagnosis not present

## 2021-03-16 DIAGNOSIS — I110 Hypertensive heart disease with heart failure: Secondary | ICD-10-CM | POA: Diagnosis not present

## 2021-03-16 DIAGNOSIS — I4891 Unspecified atrial fibrillation: Secondary | ICD-10-CM | POA: Diagnosis not present

## 2021-03-16 DIAGNOSIS — I5042 Chronic combined systolic (congestive) and diastolic (congestive) heart failure: Secondary | ICD-10-CM | POA: Diagnosis not present

## 2021-03-16 DIAGNOSIS — E1142 Type 2 diabetes mellitus with diabetic polyneuropathy: Secondary | ICD-10-CM | POA: Diagnosis not present

## 2021-03-16 DIAGNOSIS — I2510 Atherosclerotic heart disease of native coronary artery without angina pectoris: Secondary | ICD-10-CM | POA: Diagnosis not present

## 2021-03-16 DIAGNOSIS — I428 Other cardiomyopathies: Secondary | ICD-10-CM | POA: Diagnosis not present

## 2021-03-16 DIAGNOSIS — E1122 Type 2 diabetes mellitus with diabetic chronic kidney disease: Secondary | ICD-10-CM | POA: Diagnosis not present

## 2021-03-16 IMAGING — CR DG CHEST 2V
2 series · 2 of 2 positions shown · non-contrast
Comparison: 06/17/2019

CLINICAL DATA: Bilateral lower extremity weakness for 3 days

EXAM:
CHEST - 2 VIEW

[chest pa]
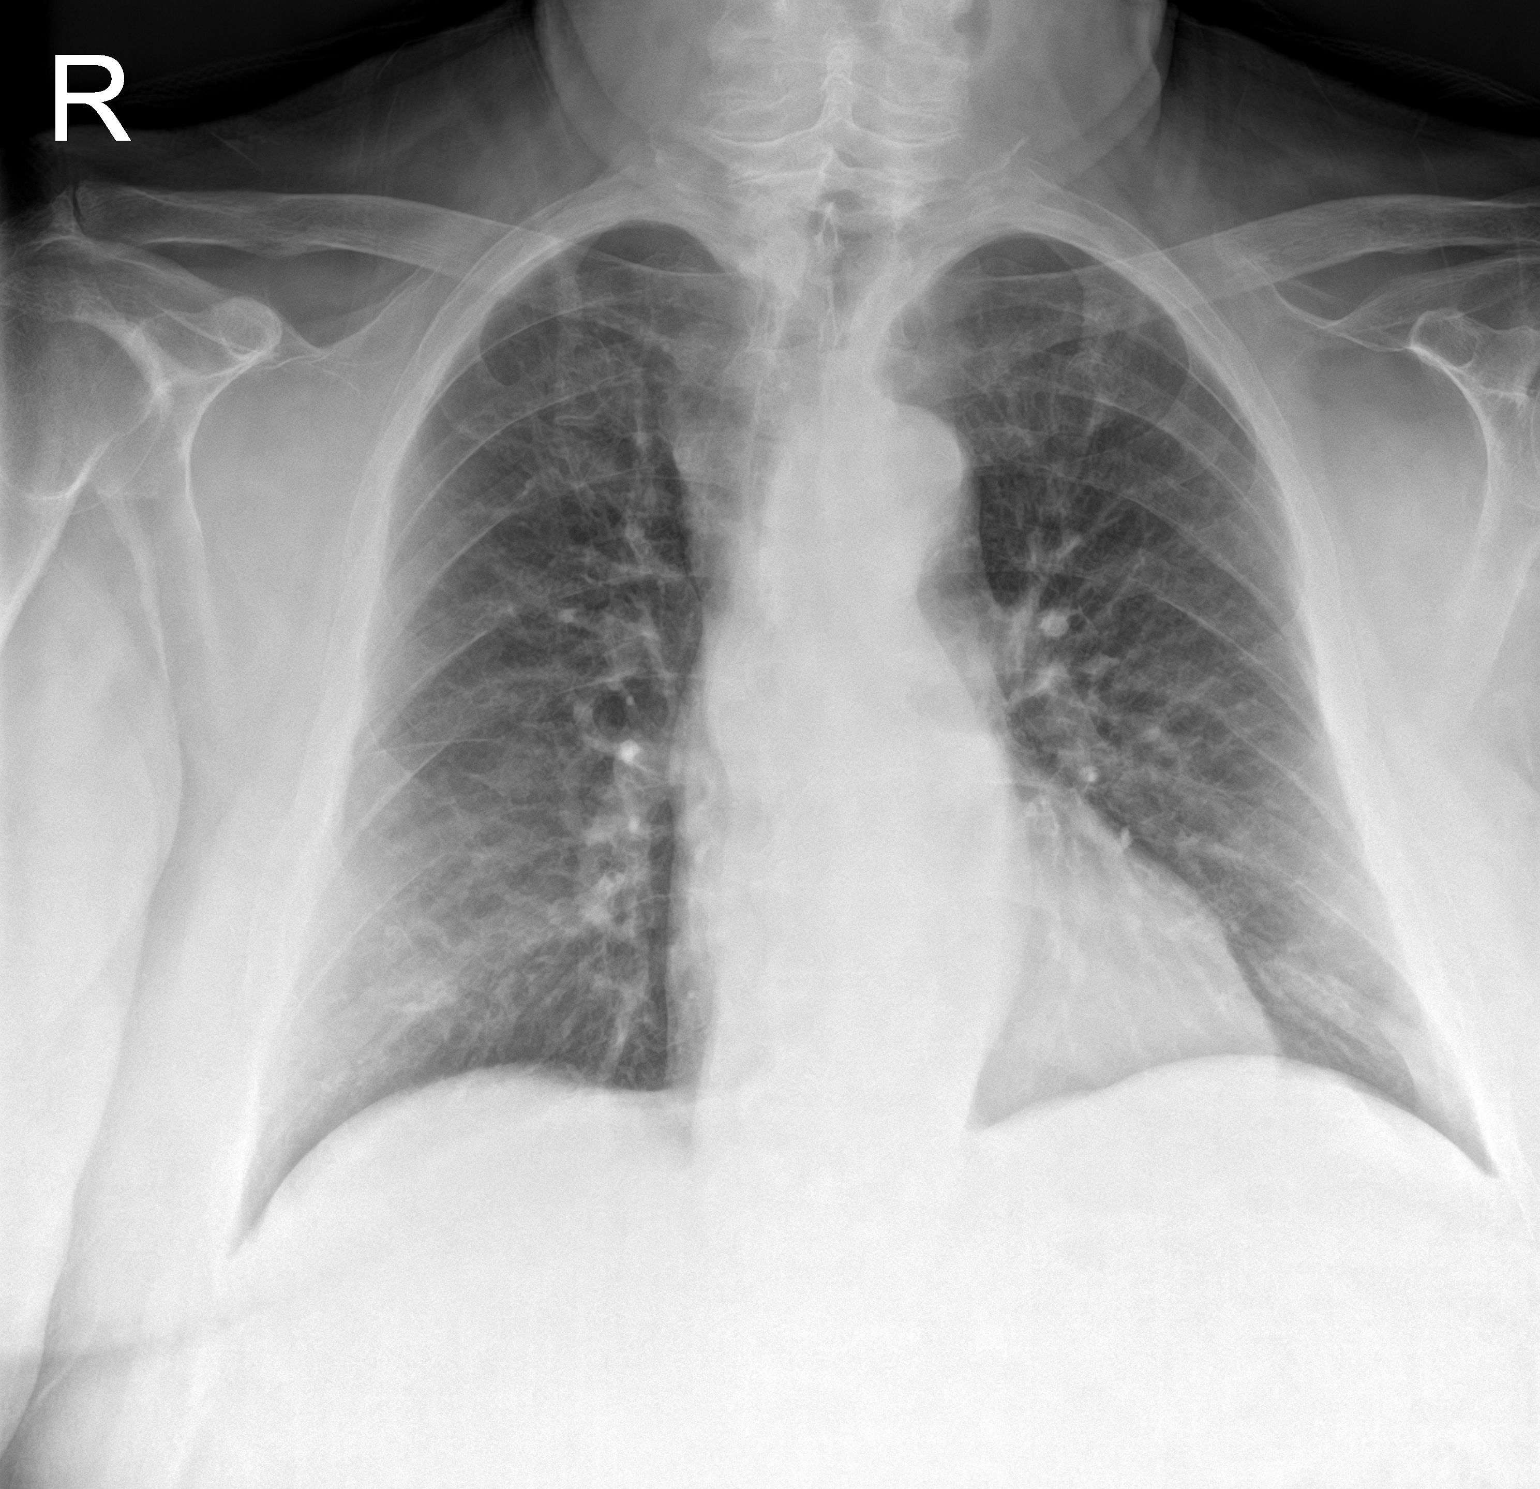

[chest lat]
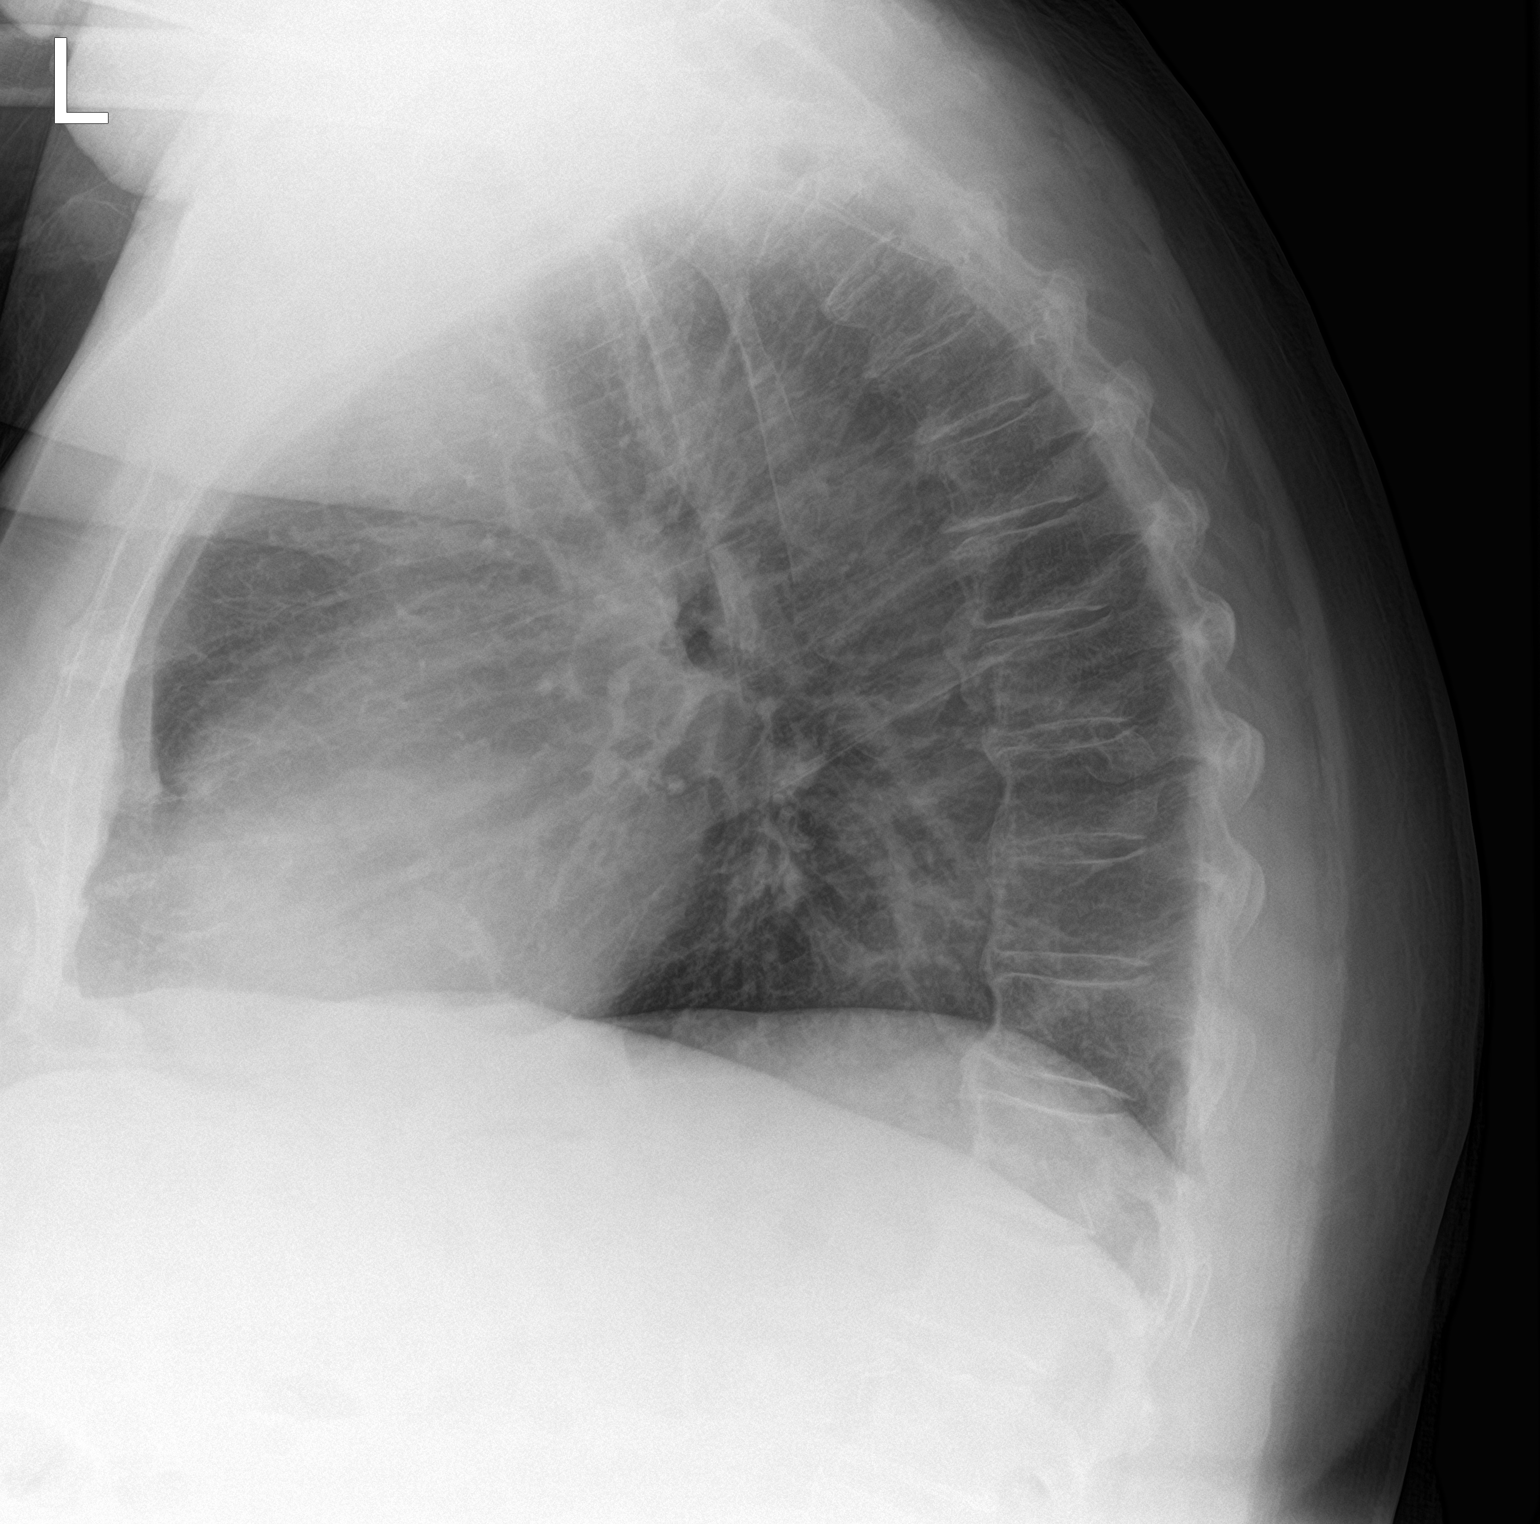

[2 of 2 positions shown; findings below may reference images not displayed]

FINDINGS: The heart size and mediastinal contours are within normal limits.
Both lungs are clear. The visualized skeletal structures are
unremarkable.
IMPRESSION: No active cardiopulmonary disease.

## 2021-03-17 ENCOUNTER — Other Ambulatory Visit: Payer: Self-pay

## 2021-03-17 MED ORDER — APIXABAN 2.5 MG PO TABS
2.5000 mg | ORAL_TABLET | Freq: Two times a day (BID) | ORAL | 1 refills | Status: DC
Start: 2021-03-17 — End: 2021-05-20

## 2021-03-20 IMAGING — CR DG CHEST 2V
1 series · 2 of 2 positions shown · non-contrast
Comparison: 08/04/2019

CLINICAL DATA: Chest pain

EXAM:
CHEST - 2 VIEW

[Series 1: dg chest 2 view · 0.14mm/px · 2 of 2 slices shown]
[im 1/2]
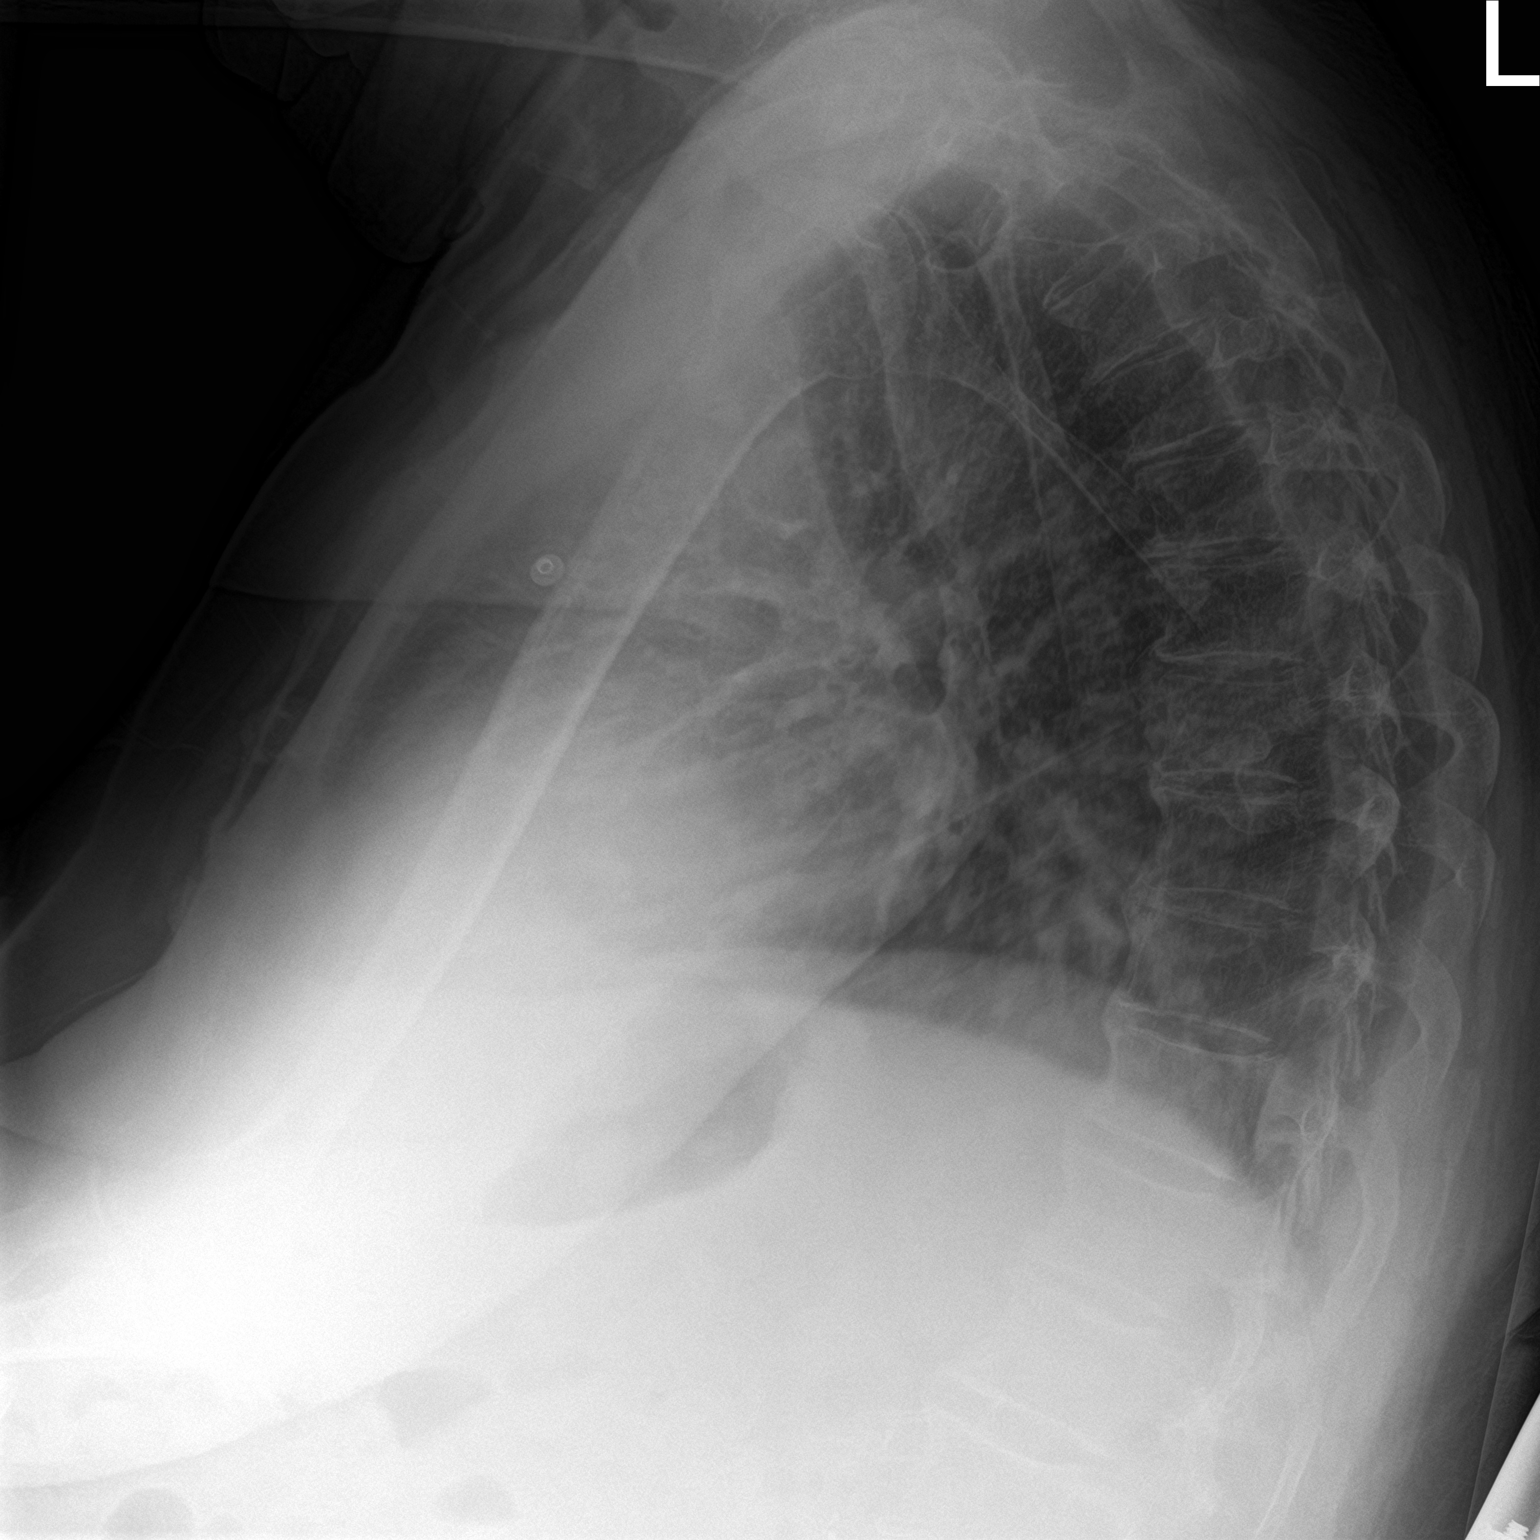
[im 2/2]
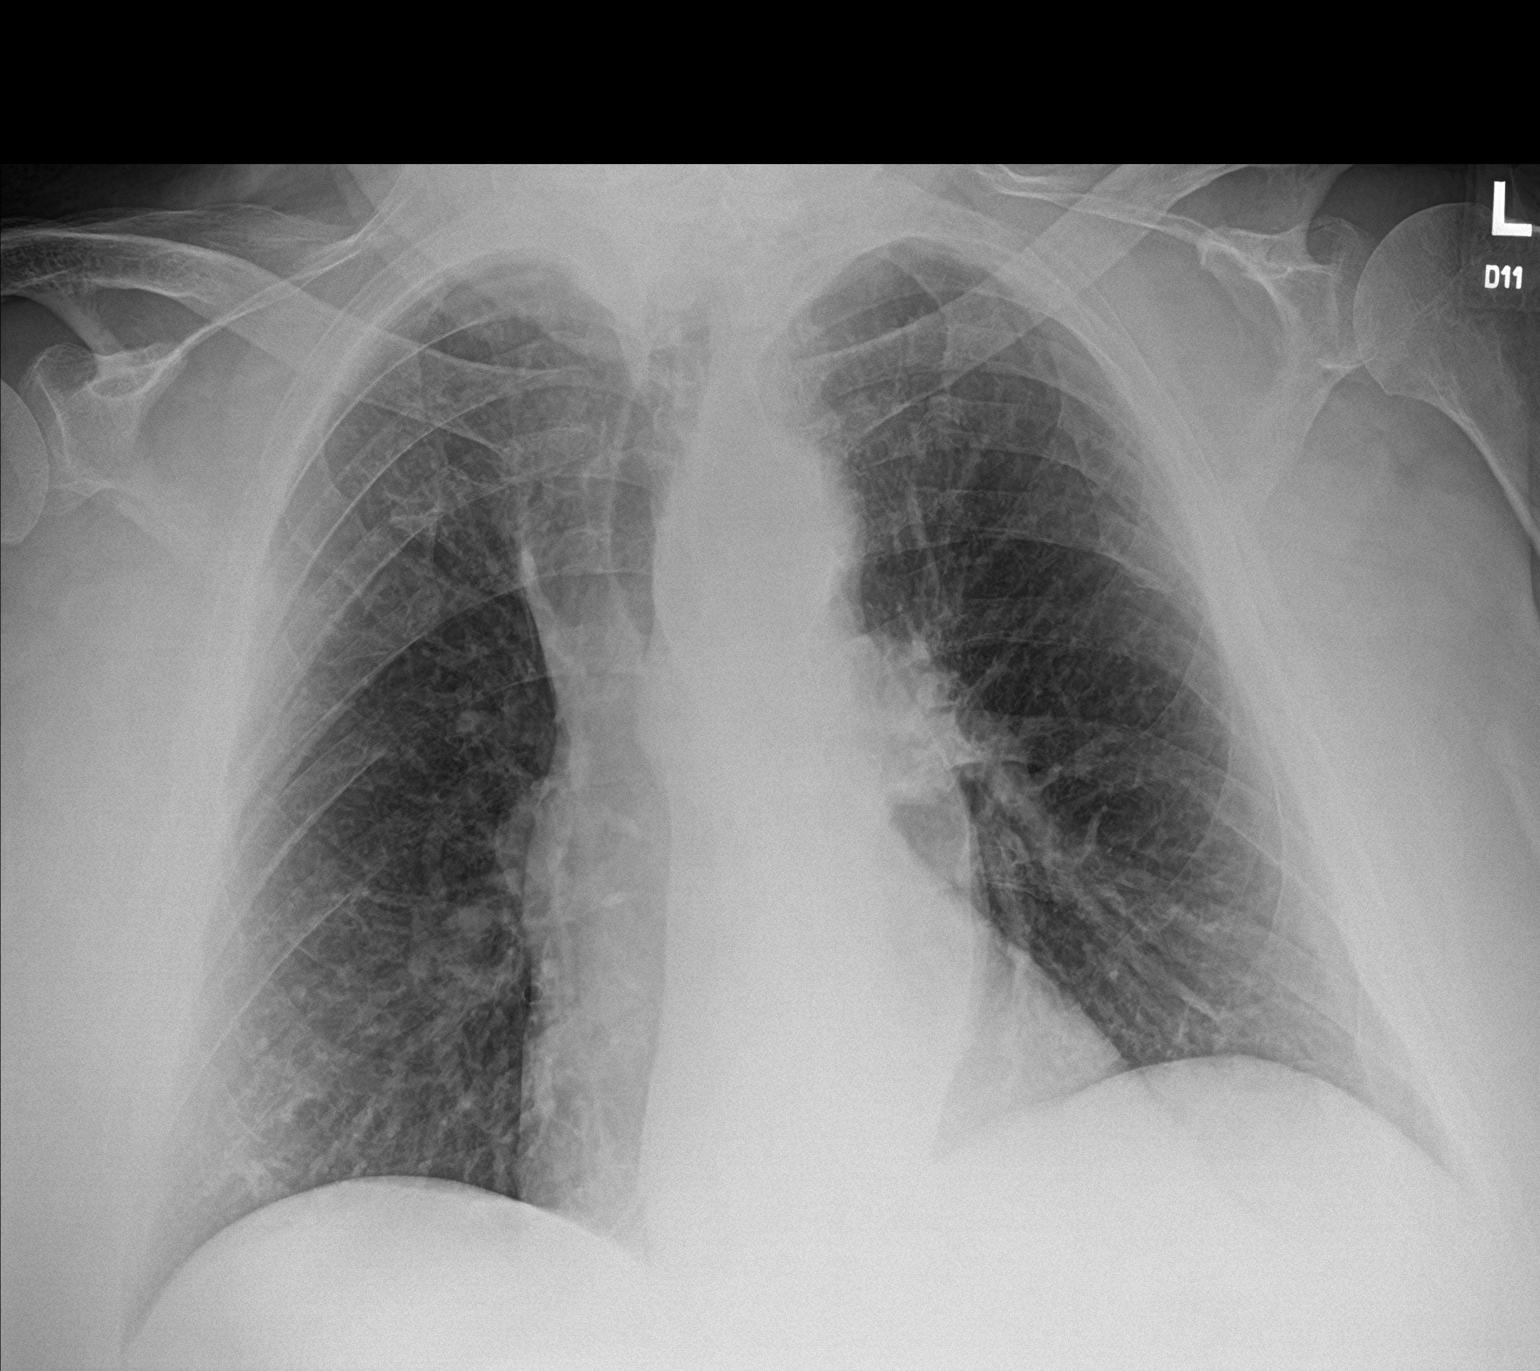

[2 of 2 positions shown; findings below may reference images not displayed]

FINDINGS: Heart size and pulmonary vascularity are normal and the lungs are
clear. No effusions. No bone abnormality.
IMPRESSION: Normal chest.

## 2021-03-20 IMAGING — DX DG FOREARM 2V*L*
2 series · 2 of 2 positions shown · non-contrast
Comparison: None.

CLINICAL DATA: Forearm pain and swelling

EXAM:
LEFT FOREARM - 2 VIEW

[forearm ap]
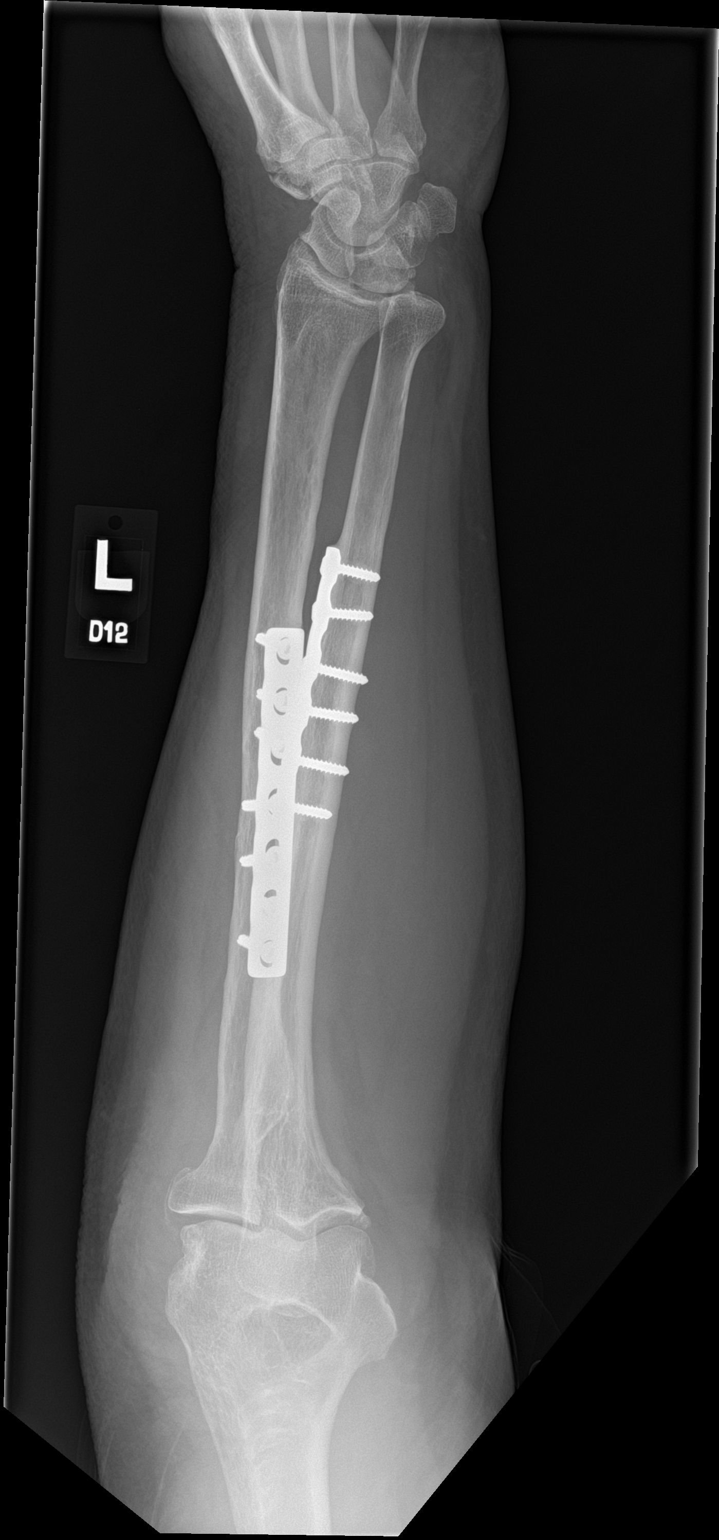

[forearm lat]
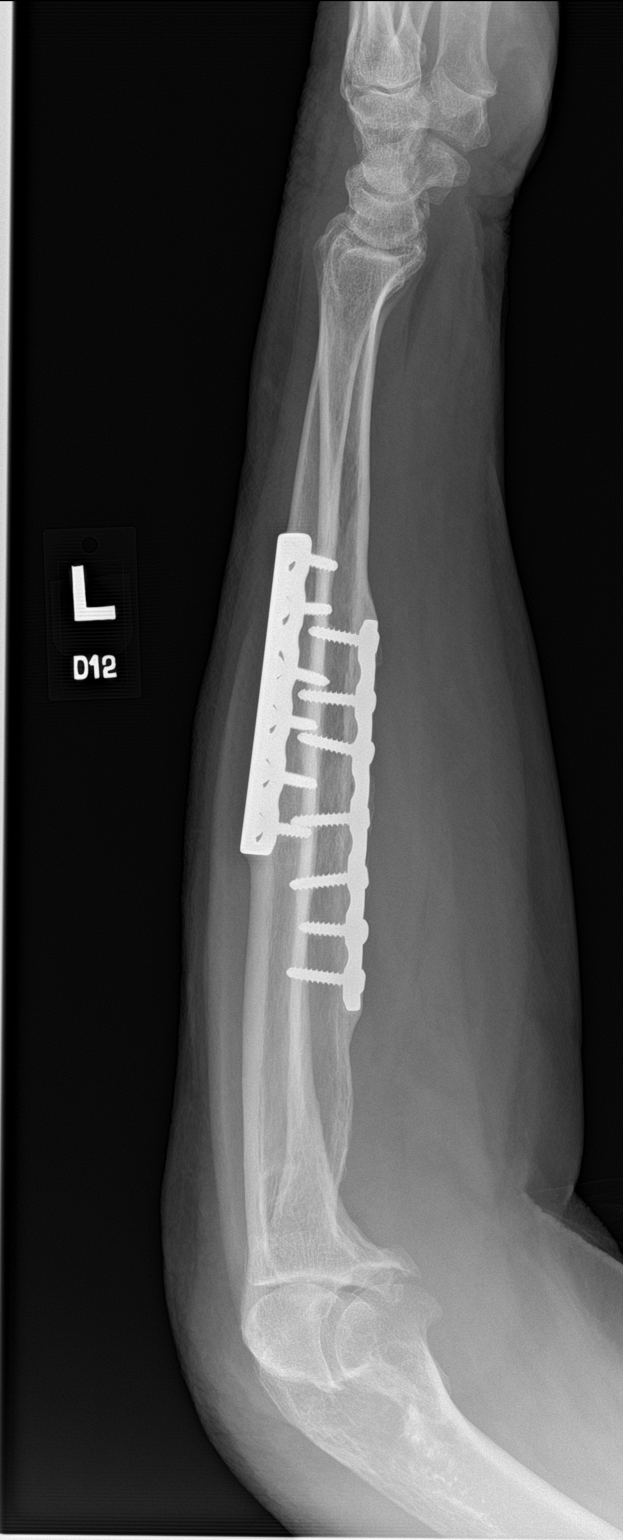

[2 of 2 positions shown; findings below may reference images not displayed]

FINDINGS: Surgical plate and fixating screws within the shafts of the radius
and ulna. No fracture or malalignment. Soft tissue swelling is
present.
IMPRESSION: Postsurgical changes of the shafts of the radius and ulna. No acute
osseous abnormality.

## 2021-03-20 IMAGING — DX DG SHOULDER 2+V*L*
4 series · 4 of 4 positions shown · non-contrast
Comparison: None.

CLINICAL DATA: Left arm pain with motion

EXAM:
LEFT SHOULDER - 2+ VIEW

[shoulder axial (1 of 2)]
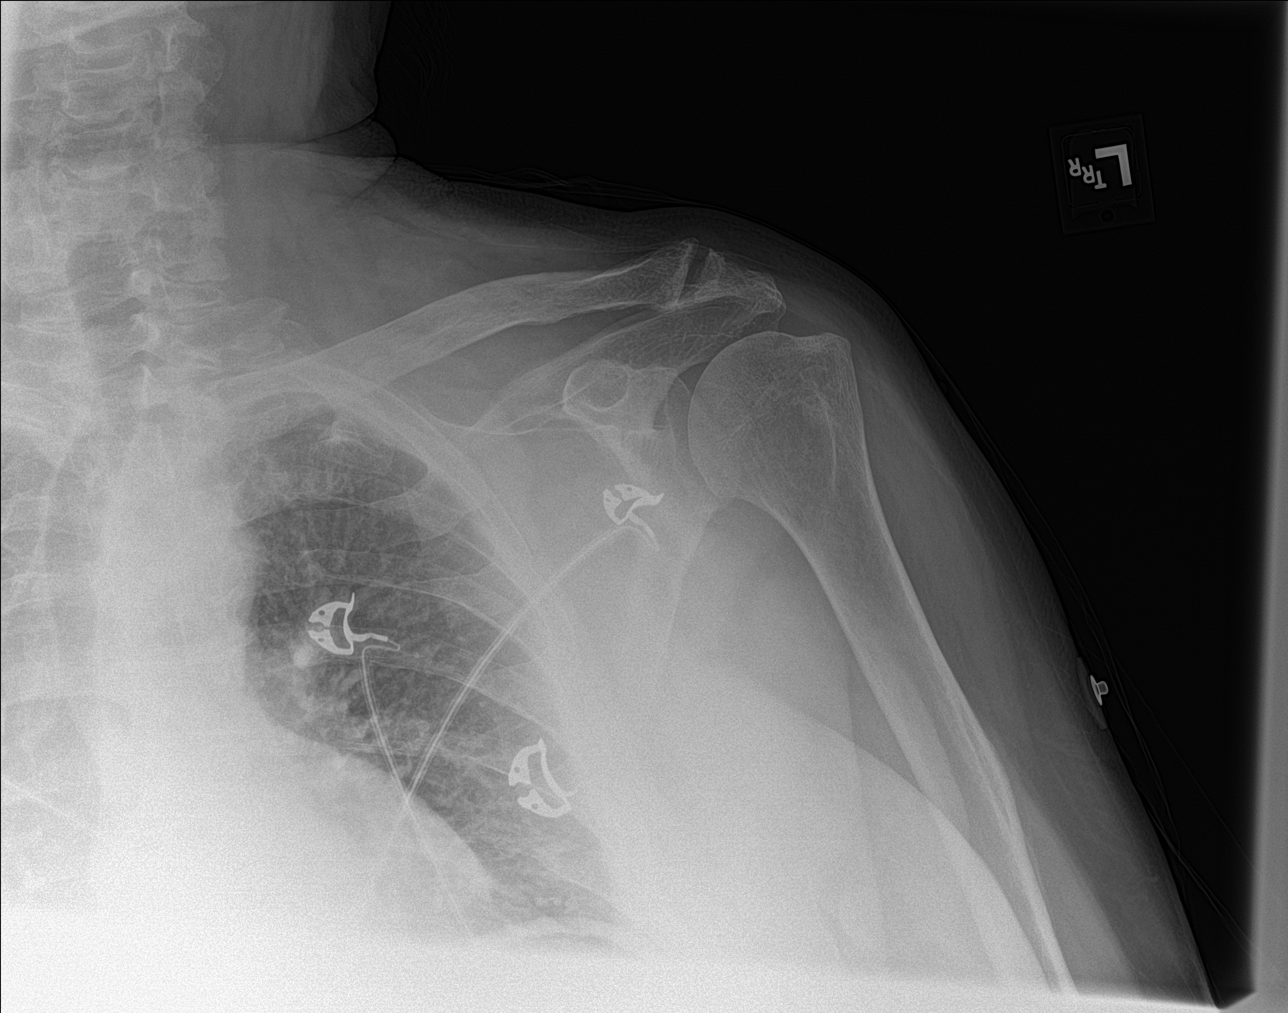

[shoulder ap]
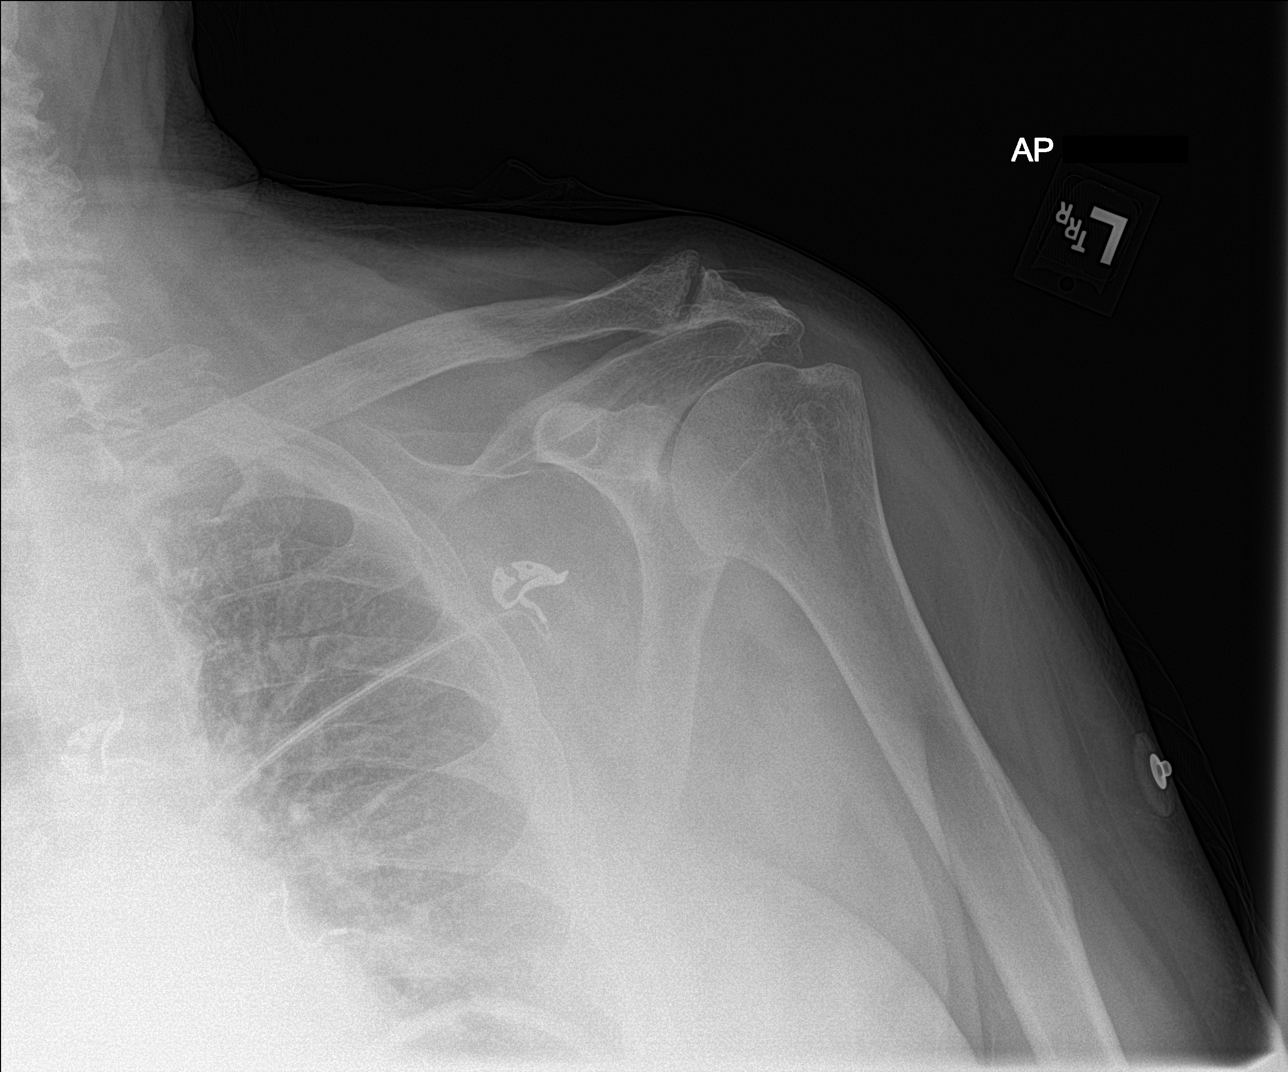

[shoulder obl]
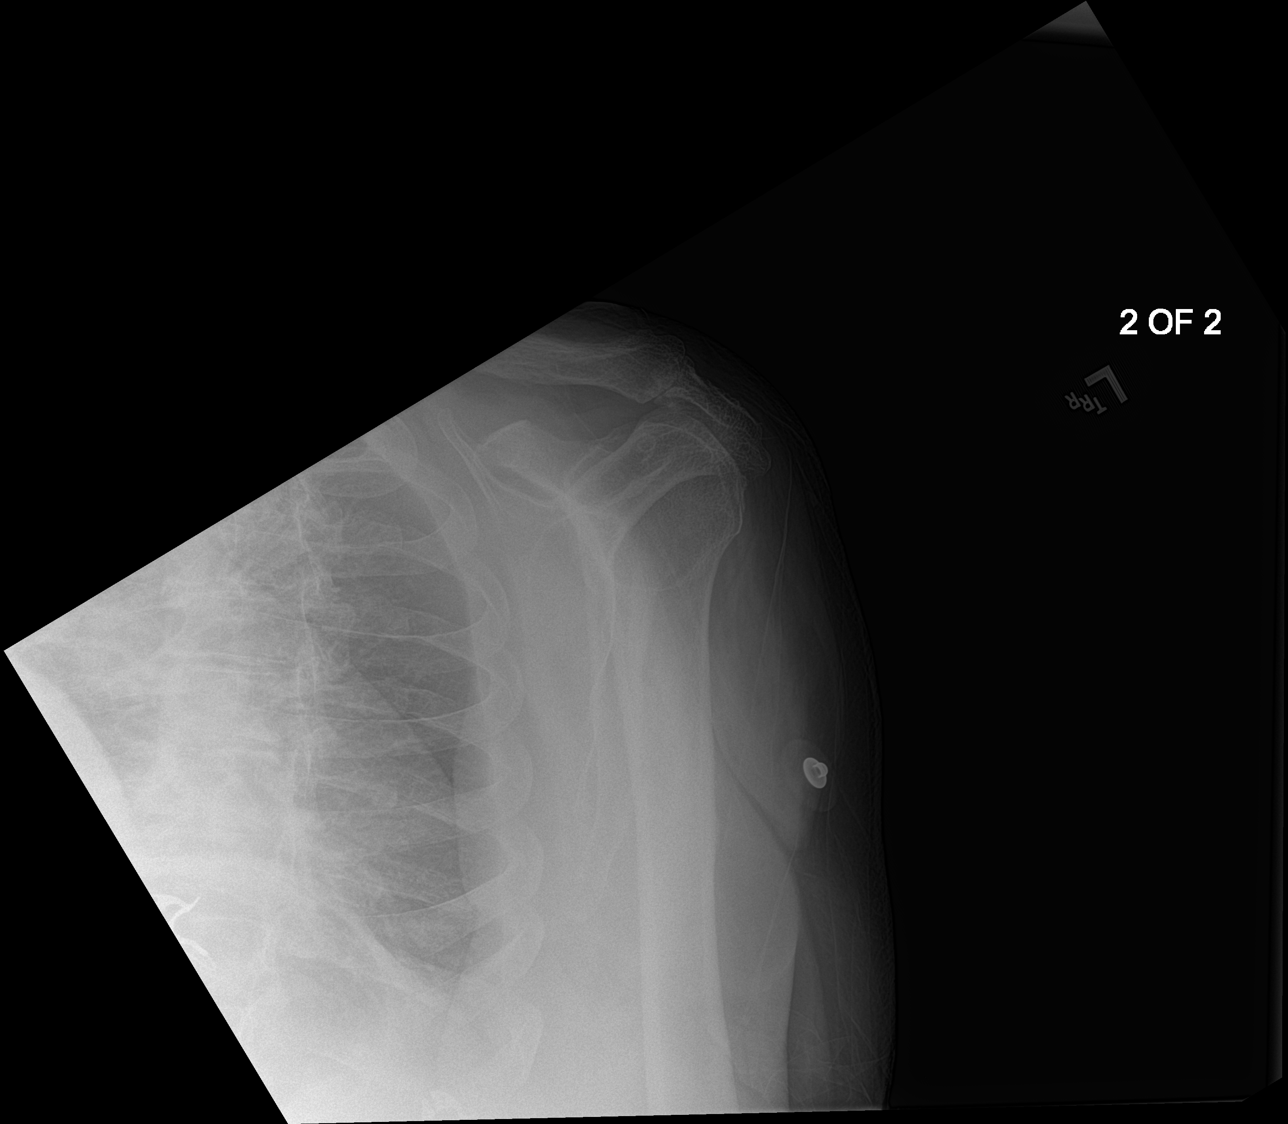

[shoulder axial (2 of 2)]
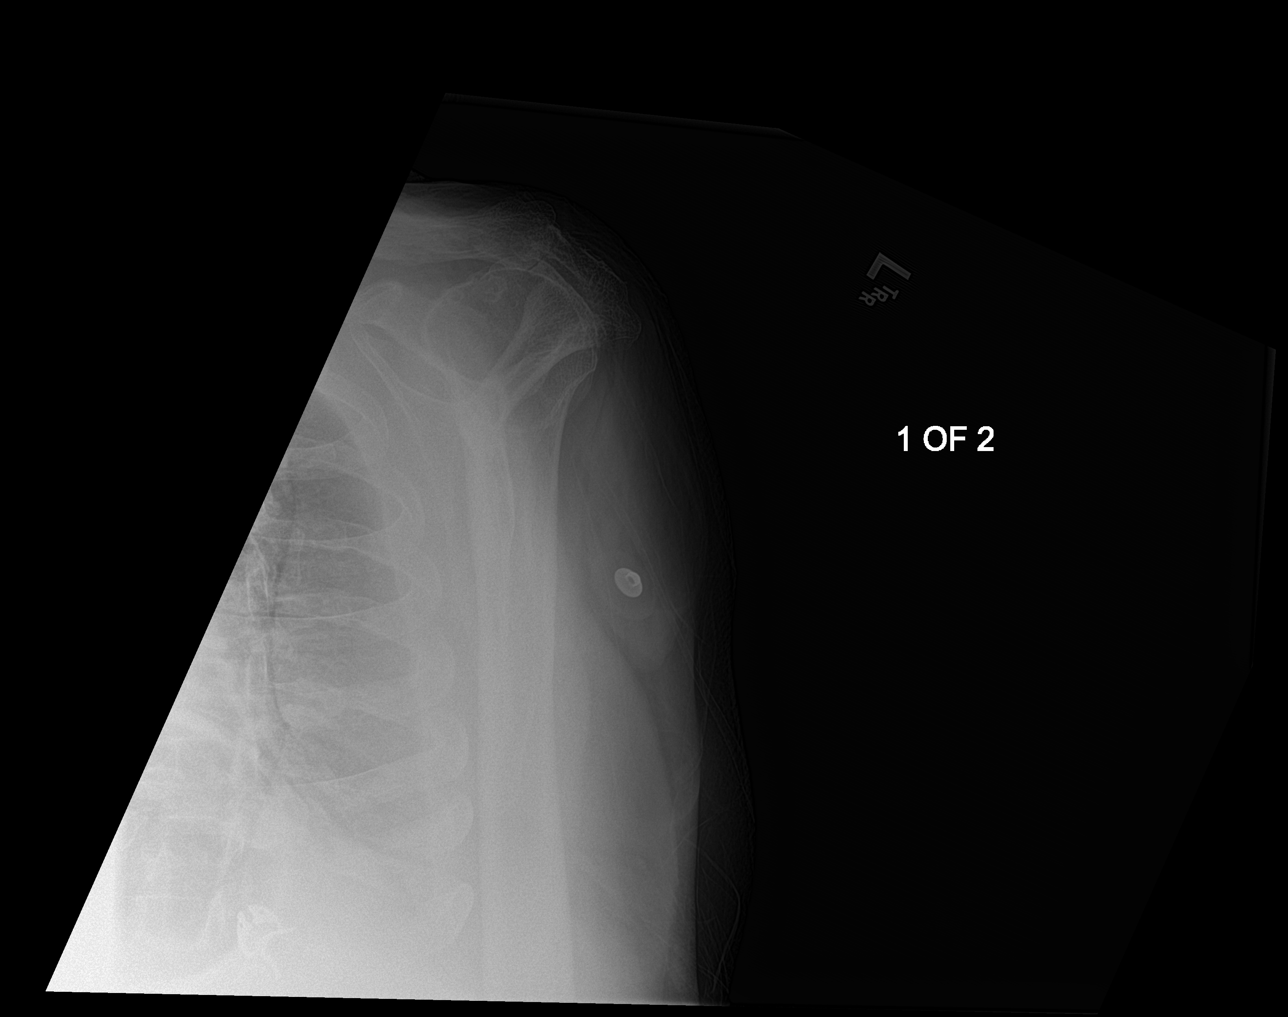

[4 of 4 positions shown; findings below may reference images not displayed]

FINDINGS: Frontal and transscapular views of the left shoulder are obtained.
Marked hypertrophic changes of the acromioclavicular joint. Mild
glenohumeral joint space narrowing. No fracture, subluxation, or
dislocation. Left chest is clear.
IMPRESSION: 1. Left shoulder osteoarthritis.  No acute bony abnormality.

## 2021-03-20 IMAGING — US US EXTREM  UP VENOUS*L*
1 series · 13 of 24 positions shown · non-contrast
Comparison: None.

CLINICAL DATA: Left forearm pain and swelling



[Series 1: us extrem up venous*left* · 13 of 31 slices shown]
[im 1/31]
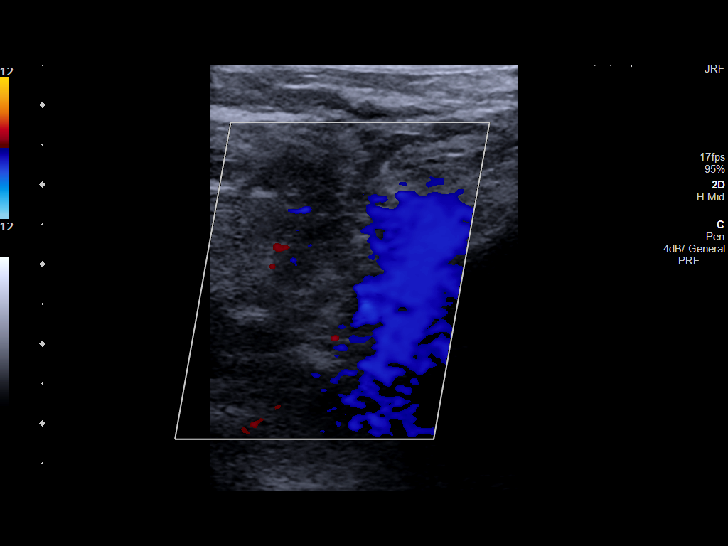
[im 3/31]
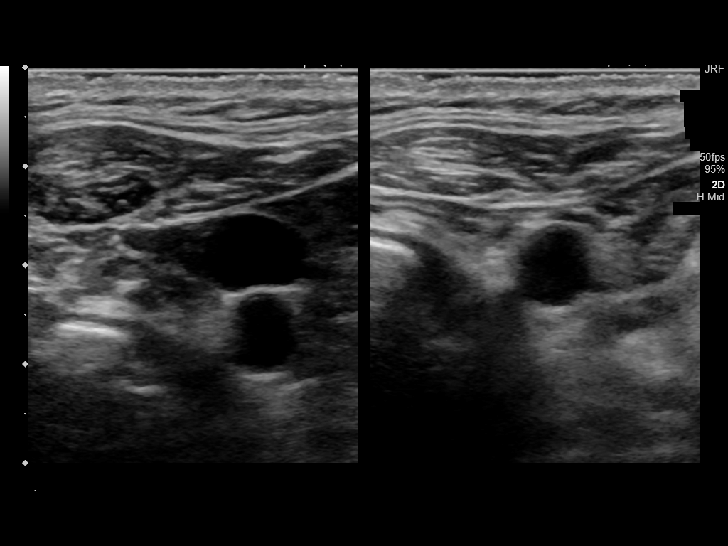
[im 6/31]
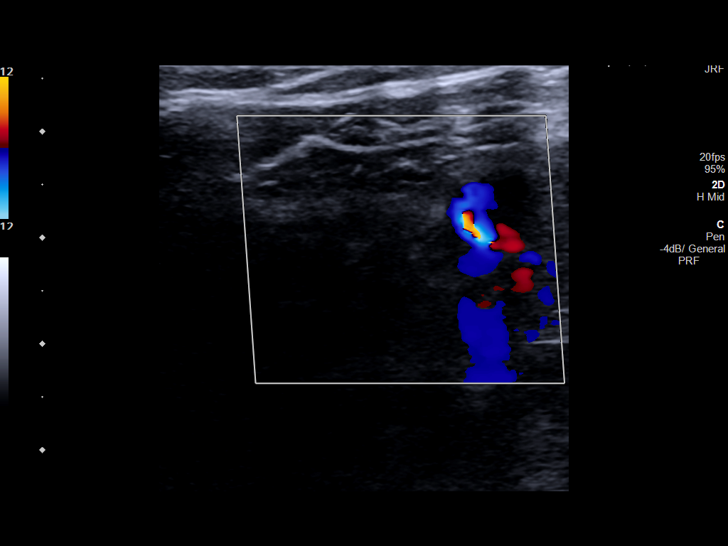
[im 8/31]
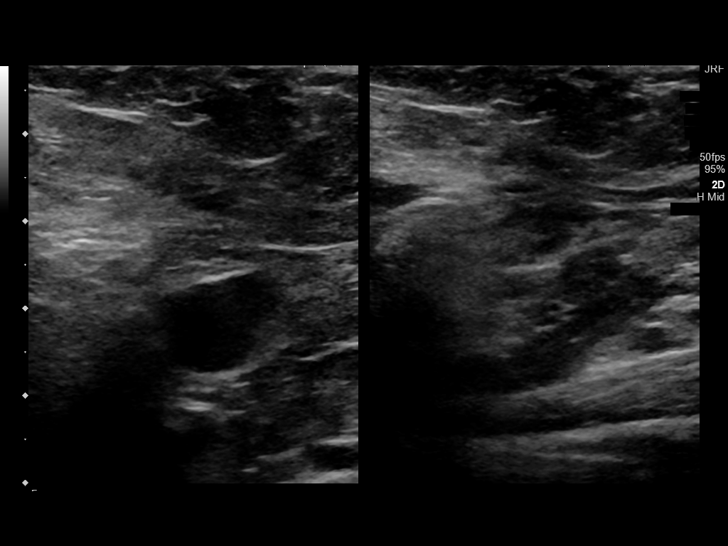
[im 11/31]
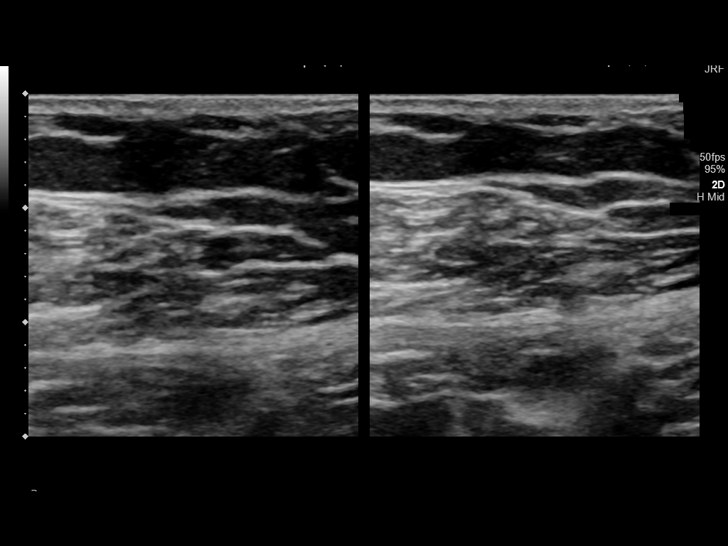
[im 14/31]
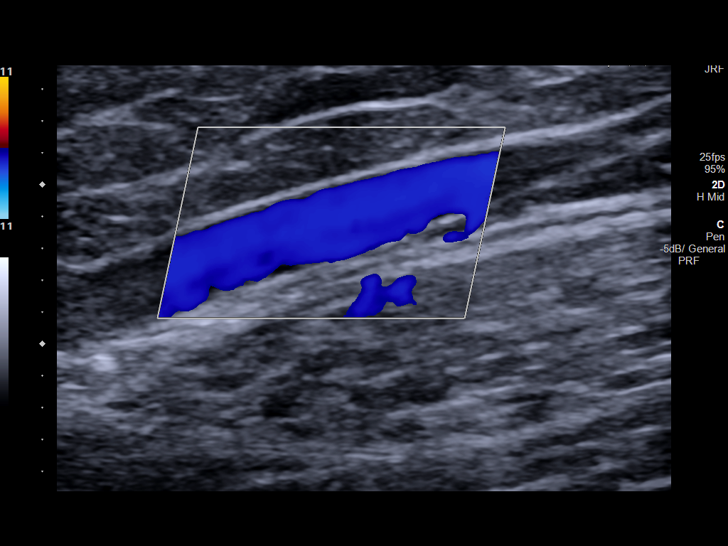
[im 16/31]
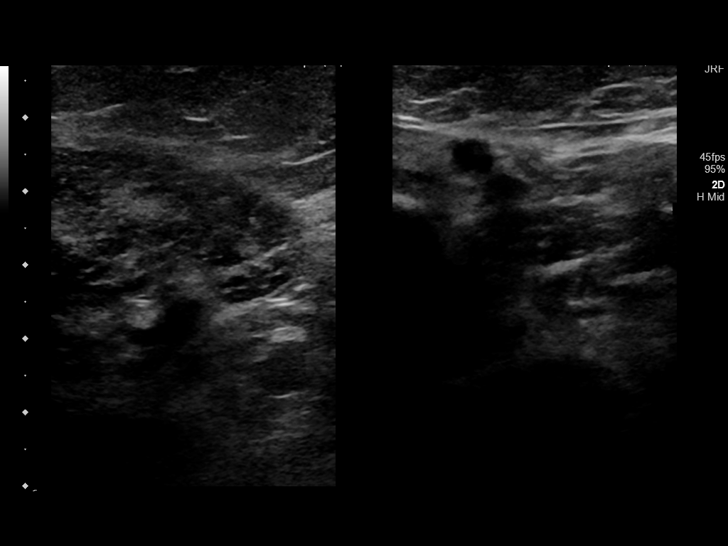
[im 17/31]
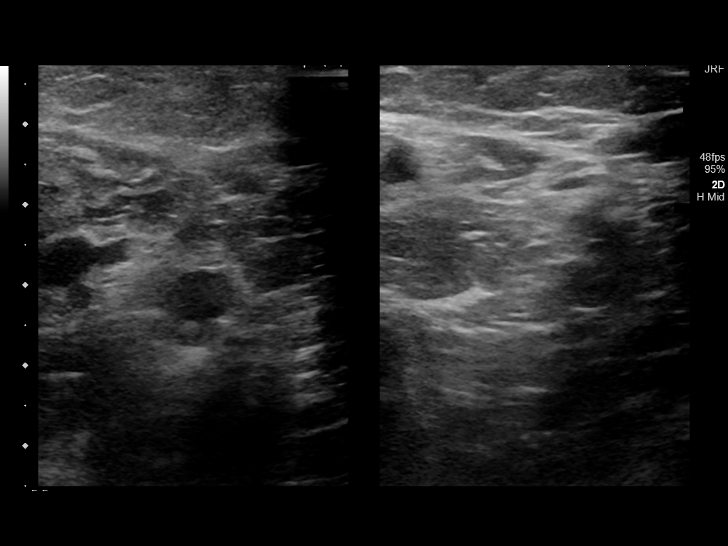
[im 20/31]
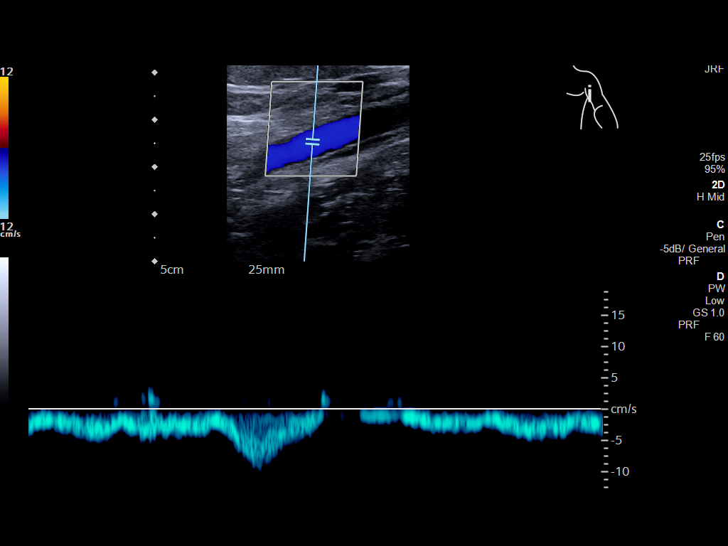
[im 23/31]
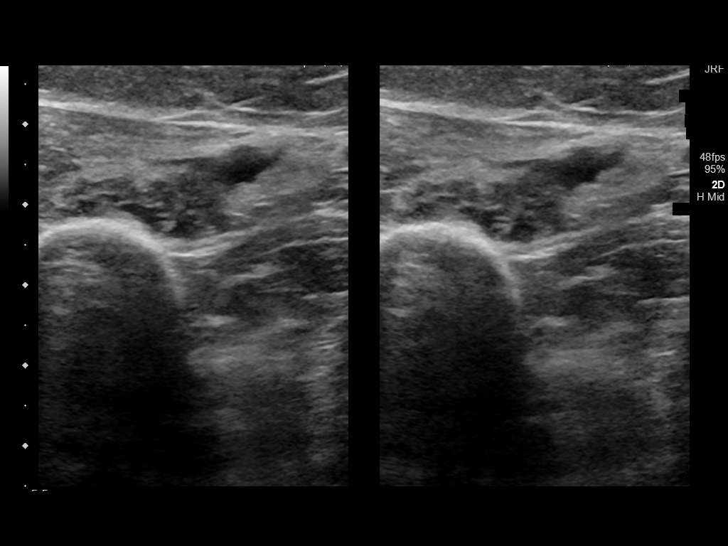
[im 25/31]
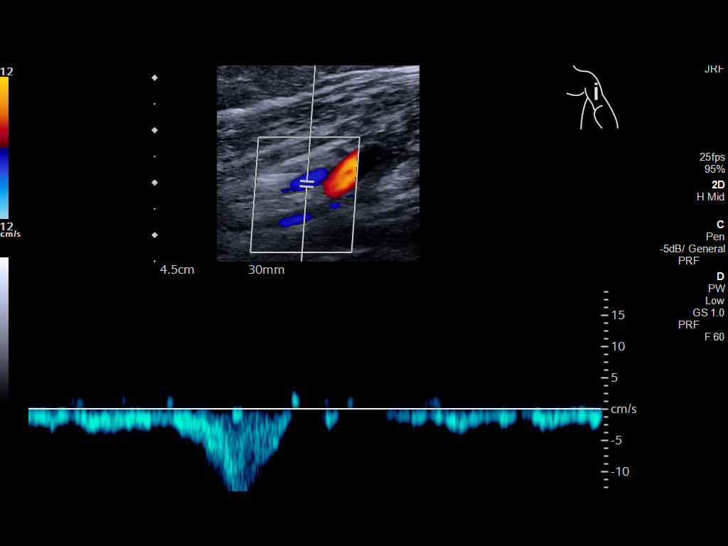
[im 28/31]
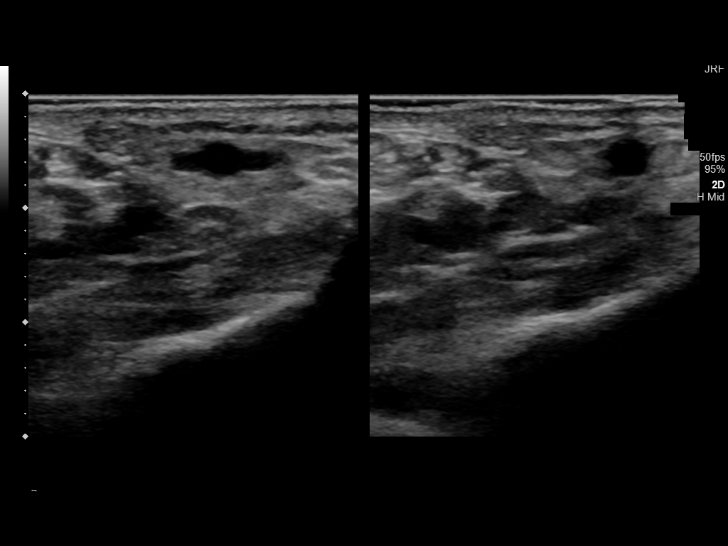
[im 31/31]
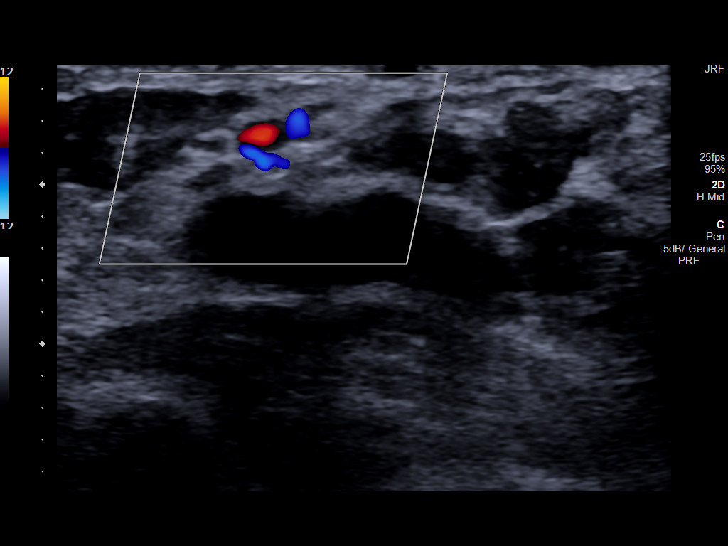

[13 of 24 positions shown; findings below may reference images not displayed]

FINDINGS: Contralateral Subclavian Vein: Respiratory phasicity is normal and
symmetric with the symptomatic side. No evidence of thrombus. Normal
compressibility.

Internal Jugular Vein: No evidence of thrombus. Normal
compressibility, respiratory phasicity and response to augmentation.

Subclavian Vein: No evidence of thrombus. Normal compressibility,
respiratory phasicity and response to augmentation.

Axillary Vein: No evidence of thrombus. Normal compressibility,
respiratory phasicity and response to augmentation.

Cephalic Vein: No evidence of thrombus. Normal compressibility,
respiratory phasicity and response to augmentation.

Basilic Vein: No evidence of thrombus. Normal compressibility,
respiratory phasicity and response to augmentation.

Brachial Veins: No evidence of thrombus. Normal compressibility,
respiratory phasicity and response to augmentation.

Radial Veins: No evidence of thrombus. Normal compressibility,
respiratory phasicity and response to augmentation.

Ulnar Veins: No evidence of thrombus. Normal compressibility,
respiratory phasicity and response to augmentation.

Venous Reflux:  None visualized.

Other Findings:  None visualized.
IMPRESSION: No evidence of DVT within the left upper extremity.

## 2021-03-22 ENCOUNTER — Telehealth: Payer: Self-pay

## 2021-03-22 DIAGNOSIS — E1142 Type 2 diabetes mellitus with diabetic polyneuropathy: Secondary | ICD-10-CM | POA: Diagnosis not present

## 2021-03-22 DIAGNOSIS — E1122 Type 2 diabetes mellitus with diabetic chronic kidney disease: Secondary | ICD-10-CM | POA: Diagnosis not present

## 2021-03-22 DIAGNOSIS — I5042 Chronic combined systolic (congestive) and diastolic (congestive) heart failure: Secondary | ICD-10-CM | POA: Diagnosis not present

## 2021-03-22 DIAGNOSIS — I4891 Unspecified atrial fibrillation: Secondary | ICD-10-CM | POA: Diagnosis not present

## 2021-03-22 DIAGNOSIS — N1832 Chronic kidney disease, stage 3b: Secondary | ICD-10-CM | POA: Diagnosis not present

## 2021-03-22 DIAGNOSIS — I428 Other cardiomyopathies: Secondary | ICD-10-CM | POA: Diagnosis not present

## 2021-03-22 DIAGNOSIS — I5043 Acute on chronic combined systolic (congestive) and diastolic (congestive) heart failure: Secondary | ICD-10-CM | POA: Diagnosis not present

## 2021-03-22 DIAGNOSIS — I110 Hypertensive heart disease with heart failure: Secondary | ICD-10-CM | POA: Diagnosis not present

## 2021-03-22 DIAGNOSIS — I2510 Atherosclerotic heart disease of native coronary artery without angina pectoris: Secondary | ICD-10-CM | POA: Diagnosis not present

## 2021-03-22 DIAGNOSIS — J441 Chronic obstructive pulmonary disease with (acute) exacerbation: Secondary | ICD-10-CM | POA: Diagnosis not present

## 2021-03-22 NOTE — Telephone Encounter (Signed)
Left vm to confirm 03/23/21 appointment-Larry Callahan 

## 2021-03-23 ENCOUNTER — Ambulatory Visit (INDEPENDENT_AMBULATORY_CARE_PROVIDER_SITE_OTHER): Payer: Medicare PPO | Admitting: Nurse Practitioner

## 2021-03-23 ENCOUNTER — Encounter: Payer: Self-pay | Admitting: Nurse Practitioner

## 2021-03-23 ENCOUNTER — Other Ambulatory Visit: Payer: Self-pay

## 2021-03-23 VITALS — BP 155/85 | HR 73 | Temp 98.5°F | Resp 16 | Ht 71.0 in | Wt 267.8 lb

## 2021-03-23 DIAGNOSIS — R300 Dysuria: Secondary | ICD-10-CM

## 2021-03-23 DIAGNOSIS — I5022 Chronic systolic (congestive) heart failure: Secondary | ICD-10-CM | POA: Diagnosis not present

## 2021-03-23 DIAGNOSIS — Z23 Encounter for immunization: Secondary | ICD-10-CM

## 2021-03-23 DIAGNOSIS — E1122 Type 2 diabetes mellitus with diabetic chronic kidney disease: Secondary | ICD-10-CM | POA: Diagnosis not present

## 2021-03-23 DIAGNOSIS — I700 Atherosclerosis of aorta: Secondary | ICD-10-CM | POA: Diagnosis not present

## 2021-03-23 DIAGNOSIS — Z119 Encounter for screening for infectious and parasitic diseases, unspecified: Secondary | ICD-10-CM

## 2021-03-23 DIAGNOSIS — E538 Deficiency of other specified B group vitamins: Secondary | ICD-10-CM | POA: Diagnosis not present

## 2021-03-23 DIAGNOSIS — E559 Vitamin D deficiency, unspecified: Secondary | ICD-10-CM

## 2021-03-23 DIAGNOSIS — K219 Gastro-esophageal reflux disease without esophagitis: Secondary | ICD-10-CM | POA: Diagnosis not present

## 2021-03-23 DIAGNOSIS — I10 Essential (primary) hypertension: Secondary | ICD-10-CM | POA: Diagnosis not present

## 2021-03-23 DIAGNOSIS — Z0001 Encounter for general adult medical examination with abnormal findings: Secondary | ICD-10-CM

## 2021-03-23 DIAGNOSIS — N1832 Chronic kidney disease, stage 3b: Secondary | ICD-10-CM | POA: Diagnosis not present

## 2021-03-23 DIAGNOSIS — D509 Iron deficiency anemia, unspecified: Secondary | ICD-10-CM | POA: Diagnosis not present

## 2021-03-23 DIAGNOSIS — Z794 Long term (current) use of insulin: Secondary | ICD-10-CM

## 2021-03-23 LAB — POCT GLYCOSYLATED HEMOGLOBIN (HGB A1C): Hemoglobin A1C: 6.8 % — AB (ref 4.0–5.6)

## 2021-03-23 MED ORDER — AMLODIPINE BESYLATE 10 MG PO TABS: 10.0000 mg | ORAL_TABLET | Freq: Every day | ORAL | 1 refills | Status: DC

## 2021-03-23 NOTE — Progress Notes (Signed)
[facility] [place], [place] [address]  Internal MEDICINE  Office Visit Note  Patient Name: Larry Callahan  [id number]  [id number]  Date of Service: 03/23/2021  Chief Complaint  Patient presents with   Medicare Wellness   Hypertension   Hyperlipidemia   Diabetes   Gastroesophageal Reflux   Quality Metric Gaps    Flu Shot and Eye Exam    HPI Larry Callahan presents for an annual well visit and physical exam. he has a history of allergies, CHF, COPD, diabetes, GERD, hyperlipidemia, hypertension, CKD stage 3. Surgical history is significant for cholecystectomy, back surgery, cardiac cath. He has not received the covid vaccine and has declined it. He has also declined the tetanus vaccine and shingles vaccine. He has a routine screening colonoscopy in 2020. His A1C is due to be checked today and his diabetic foot exam is due today. He needs a diabetic eye exam and was instructed to scheduled one with his eye doctor. He is requesting the flu vaccine today.  His blood pressure is elevated and remains so when rechecked, please see vitals. He is currently taking carvedilol 12.5 mg BID, hydralazine 100 mg TID, amlodipine 5 mg daily and furosemide 40 mg daily.  He has been in the hospital again since the last office he had but this time he had cellulitis in his right arm which has resolved at this time.  His last A1C was 6.2 in July 2022. He continues to take toujeo and glimepiride. His A1C was 6.8 today.    Current Medication: Outpatient Encounter Medications as of 03/23/2021  Medication Sig   albuterol (VENTOLIN HFA) 108 (90 Base) MCG/ACT inhaler Inhale 2 puffs into the lungs every 6 (six) hours as needed for wheezing or shortness of breath.   amLODipine (NORVASC) 10 MG tablet Take 1 tablet (10 mg total) by mouth daily.   apixaban (ELIQUIS) 2.5 MG TABS tablet Take 1 tablet (2.5 mg total) by mouth 2 (two) times daily.   Blood Glucose Calibration (TRUE METRIX LEVEL 1)  Low SOLN Use as directed   Blood Glucose Monitoring Suppl (TRUE METRIX AIR GLUCOSE METER) DEVI 1 Device by Does not apply route 2 (two) times daily.   Budeson-Glycopyrrol-Formoterol (BREZTRI AEROSPHERE) 160-9-4.8 MCG/ACT AERO Inhale 2 puffs into the lungs in the morning and at bedtime.   carvedilol (COREG) 12.5 MG tablet Take 1 tablet (12.5 mg total) by mouth 2 (two) times daily.   cetirizine (ZYRTEC) 10 MG tablet Take 10 mg by mouth daily.   escitalopram (LEXAPRO) 10 MG tablet Take one tab po qd for anxiety with food   furosemide (LASIX) 40 MG tablet Take one to tabs once or twice days for fluid // swelling   gabapentin (NEURONTIN) 100 MG capsule Take 1 capsule (100 mg total) by mouth in the morning, at noon, and at bedtime.   glimepiride (AMARYL) 2 MG tablet Take one tab po qd with supper   glucose blood (TRUE METRIX BLOOD GLUCOSE TEST) test strip Use as instructed twice daily diag E11.65   hydrALAZINE (APRESOLINE) 100 MG tablet Take 1 tablet (100 mg total) by mouth 3 (three) times daily.   insulin glargine, 1 Unit Dial, (TOUJEO) 300 UNIT/ML Solostar Pen Inject 10 Units into the skin daily. Reduced from prior 26 units daily.   ipratropium-albuterol (DUONEB) 0.5-2.5 (3) MG/3ML SOLN Take 3 mLs by nebulization every 6 (six) hours as needed.   isosorbide mononitrate (IMDUR) 30 MG 24 hr tablet Take 1 tablet (30 mg total) by  mouth 2 (two) times daily.   meclizine (ANTIVERT) 25 MG tablet Take 1 tablet (25 mg total) by mouth 3 (three) times daily as needed for dizziness or nausea.   NOVOFINE PEN NEEDLE 32G X 6 MM MISC Use as directed   omeprazole (PRILOSEC) 40 MG capsule Take 1 capsule (40 mg total) by mouth in the morning and at bedtime.   potassium chloride SA (KLOR-CON) 20 MEQ tablet Take 1 tablet (20 mEq total) by mouth 2 (two) times daily as needed. When you take Lasix.   rosuvastatin (CRESTOR) 10 MG tablet Take 1 tablet (10 mg total) by mouth at bedtime.   TRUEplus Lancets 33G MISC Use as directed  twice daily diag E11.65   vitamin B-12 1000 MCG tablet Take 1 tablet (1,000 mcg total) by mouth daily.   [DISCONTINUED] amLODipine (NORVASC) 5 MG tablet Take 1 tablet (5 mg total) by mouth daily.   No facility-administered encounter medications on file as of 03/23/2021.    Surgical History: Past Surgical History:  Procedure Laterality Date   BACK SURGERY     CARDIAC CATHETERIZATION     CHOLECYSTECTOMY     COLONOSCOPY WITH PROPOFOL N/A 04/12/2019   Procedure: COLONOSCOPY WITH PROPOFOL;  Surgeon: [name], MD;  Location[facility] ENDOSCOPY;  Service: Gastroenterology;  Laterality: N/A;   ESOPHAGOGASTRODUODENOSCOPY N/A 04/12/2019   Procedure: ESOPHAGOGASTRODUODENOSCOPY (EGD);  Surgeon: [name], MD;  Location: [facility] ENDOSCOPY;  Service: Gastroenterology;  Laterality: N/A;   ESOPHAGOGASTRODUODENOSCOPY (EGD) WITH PROPOFOL N/A 11/21/2019   Procedure: ESOPHAGOGASTRODUODENOSCOPY (EGD) WITH PROPOFOL;  Surgeon: [name], MD;  Location: [facility] ENDOSCOPY;  Service: Gastroenterology;  Laterality: N/A;   FLEXIBLE BRONCHOSCOPY Bilateral 05/17/2019   Procedure: FLEXIBLE BRONCHOSCOPY;  Surgeon: [name], MD;  Location: [name] ORS;  Service: Pulmonary;  Laterality: Bilateral;   left arm surgery     nephrectomy Left    PARATHYROIDECTOMY     RIGHT HEART CATH N/A 03/29/2019   Procedure: RIGHT HEART CATH;  Surgeon: [name], MD;  Location: [place] CV LAB;  Service: Cardiovascular;  Laterality: N/A;    Medical History: Past Medical History:  Diagnosis Date   Allergy    Chronic combined systolic (congestive) and diastolic (congestive) heart failure ([place])    a. 04/2019 Echo: EF 45-50%; b. 02/2019 Echo: EF 55-60%; c. 09/2020 Echo: EF 35-40%, glob HK. Nl RV size/fxn. Mild MR.   CKD (chronic kidney disease), stage III (HCC)    COPD (chronic obstructive pulmonary disease) ([place])    Coronary artery disease    a. 2011 Cath: nonobs dzs; b. 09/2020 MV: EF 38%. No signif  ischemia. ? inf MI vs diaph attenuation. GI uptake artifact.   Diabetes mellitus without complication ([place])    GERD (gastroesophageal reflux disease)    Hyperlipidemia    Hypertension    Morbid obesity ([place])    NICM (nonischemic cardiomyopathy) ([place])    a. 04/2019 Echo: EF 45-50%; b. 02/2019 Echo: EF 55-60%; c. 09/2020 Echo: EF 35-40%, glob HK; d. 09/2020 MV: No ischemia. ? inf infarct vs attenuation.   OSA (obstructive sleep apnea)     Family History: Family History  Problem Relation Age of Onset   Diabetes Mother        2003   Lung cancer Father    Diabetes Sister    Hypertension Sister    Heart disease Sister    Cancer Sister    Bone cancer Brother     Social History   Socioeconomic History   Marital  status: Single    Spouse name: Not on file   Number of children: Not on file   Years of education: Not on file   Highest education level: Not on file  Occupational History   Not on file  Tobacco Use   Smoking status: Former    Types: Cigarettes   Smokeless tobacco: Former    Types: Nurse, children's Use: Never used  Substance and Sexual Activity   Alcohol use: No   Drug use: No   Sexual activity: Not Currently  Other Topics Concern   Not on file  Social History Narrative   Not on file   Social Determinants of Health   Financial Resource Strain: Not on file  Food Insecurity: Not on file  Transportation Needs: Not on file  Physical Activity: Not on file  Stress: Not on file  Social Connections: Not on file  Intimate Partner Violence: Not on file      Review of Systems  Constitutional:  Negative for activity change, appetite change, chills, fatigue, fever and unexpected weight change.  HENT: Negative.  Negative for congestion, ear pain, rhinorrhea, sore throat and trouble swallowing.   Eyes: Negative.   Respiratory: Negative.  Negative for cough, chest tightness, shortness of breath and wheezing.   Cardiovascular: Negative.  Negative for chest  pain.  Gastrointestinal: Negative.  Negative for abdominal pain, blood in stool, constipation, diarrhea, nausea and vomiting.  Endocrine: Negative.   Genitourinary: Negative.  Negative for difficulty urinating, dysuria, frequency, hematuria and urgency.  Musculoskeletal: Negative.  Negative for arthralgias, back pain, joint swelling, myalgias and neck pain.  Skin: Negative.  Negative for rash and wound.  Allergic/Immunologic: Negative.  Negative for immunocompromised state.  Neurological: Negative.  Negative for dizziness, seizures, numbness and headaches.  Hematological: Negative.   Psychiatric/Behavioral: Negative.  Negative for behavioral problems, self-injury and suicidal ideas. The patient is not nervous/anxious.    Vital Signs: BP (!) 183/85   Pulse 73   Temp 98.5 F (36.9 C)   Resp 16   Ht 5' 11" (1.803 m)   Wt 267 lb 12.8 oz (121.5 kg)   SpO2 98%   BMI 37.35 kg/m    Physical Exam Vitals reviewed.  Constitutional:      General: He is awake. He is not in acute distress.    Appearance: Normal appearance. He is well-developed. He is morbidly obese. He is not ill-appearing or diaphoretic.  HENT:     Head: Normocephalic and atraumatic.     Right Ear: Tympanic membrane, ear canal and external ear normal.     Left Ear: Tympanic membrane, ear canal and external ear normal.     Nose: Nose normal. No congestion or rhinorrhea.     Mouth/Throat:     Lips: Pink.     Mouth: Mucous membranes are moist.     Pharynx: Oropharynx is clear. Uvula midline. No oropharyngeal exudate or posterior oropharyngeal erythema.  Eyes:     General: Lids are normal. Vision grossly intact. Gaze aligned appropriately. No scleral icterus.       Right eye: No discharge.        Left eye: No discharge.     Extraocular Movements: Extraocular movements intact.     Conjunctiva/sclera: Conjunctivae normal.     Pupils: Pupils are equal, round, and reactive to light.     Funduscopic exam:    Right eye: Red  reflex present.        Left eye: Red reflex  present. Neck:     Thyroid: No thyromegaly.     Vascular: No carotid bruit or JVD.     Trachea: Trachea and phonation normal. No tracheal deviation.  Cardiovascular:     Rate and Rhythm: Normal rate and regular rhythm.     Pulses:          Carotid pulses are 3+ on the right side and 3+ on the left side.      Radial pulses are 2+ on the right side and 2+ on the left side.       Dorsalis pedis pulses are 2+ on the right side and 2+ on the left side.       Posterior tibial pulses are 2+ on the right side and 2+ on the left side.     Heart sounds: Normal heart sounds, S1 normal and S2 normal. No murmur heard.   No friction rub. No gallop.  Pulmonary:     Effort: Pulmonary effort is normal. No accessory muscle usage or respiratory distress.     Breath sounds: Normal breath sounds and air entry. No stridor. No wheezing or rales.  Chest:     Chest wall: No tenderness.  Abdominal:     General: Bowel sounds are normal. There is no distension.     Palpations: Abdomen is soft. There is no shifting dullness, fluid wave, mass or pulsatile mass.     Tenderness: There is no abdominal tenderness. There is no guarding or rebound.  Musculoskeletal:        General: No tenderness or deformity. Normal range of motion.     Cervical back: Normal range of motion and neck supple.     Right lower leg: Edema present.     Left lower leg: Edema present.     Right foot: Normal range of motion. No deformity, bunion, Charcot foot, foot drop or prominent metatarsal heads.     Left foot: Normal range of motion. No deformity, bunion, Charcot foot, foot drop or prominent metatarsal heads.  Feet:     Right foot:     Protective Sensation: 6 sites tested.  6 sites sensed.     Skin integrity: Callus and dry skin present. No ulcer, blister, skin breakdown, erythema, warmth or fissure.     Toenail Condition: Right toenails are abnormally thick and long. Fungal disease present.     Left foot:     Protective Sensation: 6 sites tested.  6 sites sensed.     Skin integrity: Callus and dry skin present. No ulcer, blister, skin breakdown, erythema, warmth or fissure.     Toenail Condition: Left toenails are abnormally thick and long. Fungal disease present. Lymphadenopathy:     Cervical: No cervical adenopathy.  Skin:    General: Skin is warm and dry.     Capillary Refill: Capillary refill takes less than 2 seconds.     Coloration: Skin is not pale.     Findings: No erythema or rash.  Neurological:     Mental Status: He is alert and oriented to person, place, and time.     Cranial Nerves: No cranial nerve deficit.     Motor: No abnormal muscle tone.     Coordination: Coordination normal.     Gait: Gait normal.     Deep Tendon Reflexes: Reflexes are normal and symmetric.  Psychiatric:        Mood and Affect: Mood and affect normal.        Behavior: Behavior normal. Behavior is cooperative.  Thought Content: Thought content normal.        Judgment: Judgment normal.       Assessment/Plan: 1. Encounter for general adult medical examination with abnormal findings Age-appropriate preventive screenings and vaccinations discussed, annual physical exam completed. Routine labs for health maintenance ordered, see below. PHM updated.   2. Primary hypertension Blood pressure not well controlled, amlodipine increased to 10 mg daily.  - amLODipine (NORVASC) 10 MG tablet; Take 1 tablet (10 mg total) by mouth daily.  Dispense: 90 tablet; Refill: 1  3. Type 2 diabetes mellitus with stage 3b chronic kidney disease, with long-term current use of insulin ([place]) Routine labs ordered. A1C is 6.8, no change in medications, patient to focus on diet modifications. Follow up in 3 months and repeat A1C.  - POCT HgB A1C - CMP14+EGFR - TSH + free T4  4. Atherosclerosis of aorta (HCC) Taking rosuvastatin, routine lipid panel ordered. Carotid ultrasound done in April 2022.  - Lipid  Profile  5. Chronic systolic CHF (congestive heart failure) (HCC) Stable, followed by cardiology, taking isosorbide, and lasix  6. Gastroesophageal reflux disease without esophagitis Stable, taking omeprazole 40 mg twice daily  7. Iron deficiency anemia, unspecified iron deficiency anemia type Routine labs ordered  - CBC with Differential/Platelet - B12 and Folate Panel - Iron, TIBC and Ferritin Panel  8. Vitamin D deficiency Rule out low vitamin D - Vitamin D (25 hydroxy)  9. B12 deficiency Routine lab ordered - B12 and Folate Panel  10. Encounter for screening examination for infectious disease Lab ordered - HepB+HepC+HIV Panel  11. Dysuria Routine urinalysis done  - UA/M w/rflx Culture, Routine  12. Needs flu shot Administered in office today - Flu Vaccine MDCK QUAD PF     General Counseling: Rankin verbalizes understanding of the findings of todays visit and agrees with plan of treatment. I have discussed any further diagnostic evaluation that may be needed or ordered today. We also reviewed his medications today. he has been encouraged to call the office with any questions or concerns that should arise related to todays visit.    Orders Placed This Encounter  Procedures   Flu Vaccine MDCK QUAD PF   UA/M w/rflx Culture, Routine   HepB+HepC+HIV Panel   CBC with Differential/Platelet   CMP14+EGFR   Lipid Profile   TSH + free T4   Vitamin D (25 hydroxy)   B12 and Folate Panel   Iron, TIBC and Ferritin Panel   POCT HgB A1C    Meds ordered this encounter  Medications   amLODipine (NORVASC) 10 MG tablet    Sig: Take 1 tablet (10 mg total) by mouth daily.    Dispense:  90 tablet    Refill:  1    Amlodipine dose increased to 10 mg daily, discontinue amlodipine 5 mg daily.    Return for F/U, Recheck A1C, Larry PCP.   Total time spent:30 Minutes Time spent includes review of chart, medications, test results, and follow up plan with the patient.   [place]  Controlled Substance Database was reviewed by me.  This patient was seen by Larry Osgood, FNP-C in collaboration with Dr. Clayborn Bigness as a part of collaborative care agreement.  Larry R. Valetta Fuller, MSN, FNP-C Internal medicine

## 2021-03-24 LAB — UA/M W/RFLX CULTURE, ROUTINE
Bilirubin, UA: NEGATIVE
Ketones, UA: NEGATIVE
Leukocytes,UA: NEGATIVE
Nitrite, UA: NEGATIVE
RBC, UA: NEGATIVE
Specific Gravity, UA: 1.009 (ref 1.005–1.030)
Urobilinogen, Ur: 0.2 mg/dL (ref 0.2–1.0)
pH, UA: 5 (ref 5.0–7.5)

## 2021-03-24 LAB — MICROSCOPIC EXAMINATION
Bacteria, UA: NONE SEEN
Casts: NONE SEEN /lpf
Epithelial Cells (non renal): NONE SEEN /hpf (ref 0–10)
RBC, Urine: NONE SEEN /hpf (ref 0–2)
WBC, UA: NONE SEEN /hpf (ref 0–5)

## 2021-03-27 DIAGNOSIS — E1122 Type 2 diabetes mellitus with diabetic chronic kidney disease: Secondary | ICD-10-CM | POA: Diagnosis not present

## 2021-03-27 DIAGNOSIS — I428 Other cardiomyopathies: Secondary | ICD-10-CM | POA: Diagnosis not present

## 2021-03-27 DIAGNOSIS — I5042 Chronic combined systolic (congestive) and diastolic (congestive) heart failure: Secondary | ICD-10-CM | POA: Diagnosis not present

## 2021-03-27 DIAGNOSIS — E1142 Type 2 diabetes mellitus with diabetic polyneuropathy: Secondary | ICD-10-CM | POA: Diagnosis not present

## 2021-03-27 DIAGNOSIS — I2510 Atherosclerotic heart disease of native coronary artery without angina pectoris: Secondary | ICD-10-CM | POA: Diagnosis not present

## 2021-03-27 DIAGNOSIS — N1832 Chronic kidney disease, stage 3b: Secondary | ICD-10-CM | POA: Diagnosis not present

## 2021-03-27 DIAGNOSIS — I110 Hypertensive heart disease with heart failure: Secondary | ICD-10-CM | POA: Diagnosis not present

## 2021-03-27 DIAGNOSIS — I4891 Unspecified atrial fibrillation: Secondary | ICD-10-CM | POA: Diagnosis not present

## 2021-03-27 DIAGNOSIS — J441 Chronic obstructive pulmonary disease with (acute) exacerbation: Secondary | ICD-10-CM | POA: Diagnosis not present

## 2021-03-30 ENCOUNTER — Encounter: Payer: Self-pay | Admitting: General Surgery

## 2021-03-30 DIAGNOSIS — I5042 Chronic combined systolic (congestive) and diastolic (congestive) heart failure: Secondary | ICD-10-CM | POA: Diagnosis not present

## 2021-03-30 DIAGNOSIS — E1142 Type 2 diabetes mellitus with diabetic polyneuropathy: Secondary | ICD-10-CM | POA: Diagnosis not present

## 2021-03-30 DIAGNOSIS — J441 Chronic obstructive pulmonary disease with (acute) exacerbation: Secondary | ICD-10-CM | POA: Diagnosis not present

## 2021-03-30 DIAGNOSIS — E1122 Type 2 diabetes mellitus with diabetic chronic kidney disease: Secondary | ICD-10-CM | POA: Diagnosis not present

## 2021-03-30 DIAGNOSIS — N1832 Chronic kidney disease, stage 3b: Secondary | ICD-10-CM | POA: Diagnosis not present

## 2021-03-30 DIAGNOSIS — I428 Other cardiomyopathies: Secondary | ICD-10-CM | POA: Diagnosis not present

## 2021-03-30 DIAGNOSIS — I4891 Unspecified atrial fibrillation: Secondary | ICD-10-CM | POA: Diagnosis not present

## 2021-03-30 DIAGNOSIS — I2510 Atherosclerotic heart disease of native coronary artery without angina pectoris: Secondary | ICD-10-CM | POA: Diagnosis not present

## 2021-03-30 DIAGNOSIS — I110 Hypertensive heart disease with heart failure: Secondary | ICD-10-CM | POA: Diagnosis not present

## 2021-04-13 ENCOUNTER — Telehealth: Payer: Self-pay | Admitting: Pharmacist

## 2021-04-13 NOTE — Progress Notes (Signed)
Chronic Care Management Pharmacy Assistant   Name: Larry Callahan  MRN: [id number] DOB: 05-15-1956  Larry Callahan is an 65 y.o. year old male who presents for his initial CCM visit with the clinical pharmacist.  Reason for Encounter: Chart Prep    Conditions to be addressed/monitored: COPD,HTN, CHF, Type 2 DM, HLD  Primary concerns for visit include:  COPD  Recent office visits:  03/23/21 Larry Osgood, NP. For [employer]. CHANGED Amlodipine to 10 mg daily.   02/24/21 Larry Osgood, NP. For follow-up. STARTED Amlodipine Besylate 5 mg daily. STOPPED Prednisone, Doxycyline, and SPIRIVA HANDIHALER 11/30/20 Dr. Humphrey Rolls For follow-up. No medication changes. Per note: Patient was sent to the ER.  11/02/20 Callahan, Larry K, PA-C. For follow-up. No medication changes. Per note:Patient requesting Toujeo sample today 1 box Toujeo given.  Recent consult visits:  12/08/20 Maryellen Pile, Larry Sabal, MD. For Recurrence abscess. STARTED Doxycycline Hyclate 100 mg pack. 10/30/20 Cardiology Dunn, Larry Haber, PA-C. For follow-up. STOPPED Rosuvastatin.  10/15/20 Larry Slocumb, MD. For open wound. No medication changes.  10/13/20 Larry Slocumb, MD. For suture removal No medication changes.   Hospital visits:  03/02/21 [facility] (5 Days) For Cellulitis of right arm. Per note: As we discussed, your insulin TOUJEO at 26 units daily is likely too high.  I have lowered it to 10 units daily. 03/01/21 [facility] Emergency Department (1 Hour) Larry Crofts, MD. For Cellulitis. STARTED Cefdinir 300 mg daily.  02/18/21 [facility] Emergency Department (5 Hours) Larry Kitten, MD. For Chest pain. No medication changes.  01/28/21 [facility] Emergency Department (27 hours) Nance Pear, MD For COPD. STARTED Doxycycline 100 mg every 12 hours and Prednisone 40 mg daily. 01/07/21 [facility] (2 Days) Larry Mandes, MD. For chest pain.STOPPED Doxycyline. 11/30/20 [facility] Emergency Department (1 Hour) Larry Drafts, MD. For left flank pain. No medication changes. 11/24/20 [facility] Emergency Department (7 hours) Larry Blanch, MD. For headache. STARTED Meclizine 25 mg 3 times daily PRN. 11/16/20 [facility] (2 Days) Larry Dusky, MD. For Syncope and Collapse. No medication changes.  11/02/20  [facility] Emergency Department (2 Hours) Larry Silence, MD. For influenza. No medication changes. 10/31/20 [facility] Emergency Department (5 hours) Larry Ebbs, MD. For COPD Exacerbation. No medication changes.  10/30/20 [facility] ( 27 Hours) Larry Nimrod, MD. For chest pain. Per note:As we discussed you have flu, please takeTamiflu for 5 days.You are also being given steroids for 4 more days.You can use your pain medication to help with this chest pain if needed. 10/15/20 [facility] Emergency Department (22 Hours) Ward, Larry Bison, DO. For shortness of breath. No medication changes.   Medication History: Rosuvastatin 10 mg 12/12/20 90 DS. Glimepiride 2 mg 01/11/21 90 DS.  Medications: Outpatient Encounter Medications as of 04/13/2021  Medication Sig   albuterol (VENTOLIN HFA) 108 (90 Base) MCG/ACT inhaler Inhale 2 puffs into the lungs every 6 (six) hours as needed for wheezing or shortness of breath.   amLODipine (NORVASC) 10 MG tablet Take 1 tablet (10 mg total) by mouth daily.   apixaban (ELIQUIS) 2.5 MG TABS tablet Take 1 tablet (2.5 mg total) by mouth 2 (two) times daily.   Blood Glucose Calibration (TRUE METRIX LEVEL 1) Low SOLN Use as directed   Blood Glucose Monitoring Suppl (TRUE METRIX AIR GLUCOSE METER)  DEVI 1 Device by Does not apply route 2 (two) times daily.   Budeson-Glycopyrrol-Formoterol (BREZTRI  AEROSPHERE) 160-9-4.8 MCG/ACT AERO Inhale 2 puffs into the lungs in the morning and at bedtime.   carvedilol (COREG) 12.5 MG tablet Take 1 tablet (12.5 mg total) by mouth 2 (two) times daily.   cetirizine (ZYRTEC) 10 MG tablet Take 10 mg by mouth daily.   escitalopram (LEXAPRO) 10 MG tablet Take one tab po qd for anxiety with food   furosemide (LASIX) 40 MG tablet Take one to tabs once or twice days for fluid // swelling   gabapentin (NEURONTIN) 100 MG capsule Take 1 capsule (100 mg total) by mouth in the morning, at noon, and at bedtime.   glimepiride (AMARYL) 2 MG tablet Take one tab po qd with supper   glucose blood (TRUE METRIX BLOOD GLUCOSE TEST) test strip Use as instructed twice daily diag E11.65   hydrALAZINE (APRESOLINE) 100 MG tablet Take 1 tablet (100 mg total) by mouth 3 (three) times daily.   insulin glargine, 1 Unit Dial, (TOUJEO) 300 UNIT/ML Solostar Pen Inject 10 Units into the skin daily. Reduced from prior 26 units daily.   ipratropium-albuterol (DUONEB) 0.5-2.5 (3) MG/3ML SOLN Take 3 mLs by nebulization every 6 (six) hours as needed.   isosorbide mononitrate (IMDUR) 30 MG 24 hr tablet Take 1 tablet (30 mg total) by mouth 2 (two) times daily.   meclizine (ANTIVERT) 25 MG tablet Take 1 tablet (25 mg total) by mouth 3 (three) times daily as needed for dizziness or nausea.   NOVOFINE PEN NEEDLE 32G X 6 MM MISC Use as directed   omeprazole (PRILOSEC) 40 MG capsule Take 1 capsule (40 mg total) by mouth in the morning and at bedtime.   potassium chloride SA (KLOR-CON) 20 MEQ tablet Take 1 tablet (20 mEq total) by mouth 2 (two) times daily as needed. When you take Lasix.   rosuvastatin (CRESTOR) 10 MG tablet Take 1 tablet (10 mg total) by mouth at bedtime.   TRUEplus Lancets 33G MISC Use as directed twice daily diag E11.65   vitamin B-12 1000 MCG tablet Take 1 tablet (1,000 mcg total) by mouth daily.   No facility-administered encounter medications on file as of 04/13/2021.    Have  you seen any other providers since your last visit? Patient stated no.  Any changes in your medications or health? Patient stated no.   Any side effects from any medications? Patient stated no.   Do you have an symptoms or problems not managed by your medications? Patient stated no.   Any concerns about your health right now? Patient stated no.  Has your provider asked that you check blood pressure, blood sugar, or follow special diet at home? Patient stated he checks his blood pressure and blood sugar.   Do you get any type of exercise on a regular basis? Patient stated no.  Can you think of a goal you would like to reach for your health? Patient stated no.  Do you have any problems getting your medications? Patient stated he was not home so he was not sure.  Is there anything that you would like to discuss during the appointment? Patient stated no.   Please bring medications and supplements to appointment, patient reminded of his appointment on 05/12/21 at 9 am. (The appointment was rescheduled )  [place], [place] Pharmacist Assistant [phone]

## 2021-04-16 ENCOUNTER — Ambulatory Visit: Payer: Medicare PPO

## 2021-04-20 ENCOUNTER — Telehealth: Payer: Self-pay

## 2021-04-20 NOTE — Telephone Encounter (Signed)
Medical record request completed and requesting records faxed to [facility] at [phone].

## 2021-04-22 DIAGNOSIS — I5043 Acute on chronic combined systolic (congestive) and diastolic (congestive) heart failure: Secondary | ICD-10-CM | POA: Diagnosis not present

## 2021-05-10 NOTE — Progress Notes (Signed)
Chronic Care Management Pharmacy Note  05/12/2021 Name:  Jaycee Pelzer Russomanno MRN:  [id number] DOB:  [birth year]  Summary: Patient has not been taking Amlodipine at all and cannot recall any home blood pressure readings. Reminded him that needed to be picked up at pharmacy. Patient reports no low/high sugars and no weight fluctuations.  Patient states has not been taking lexapro and does not feel anxious. Will remove from medication list. Reminded patient to get his lab work completed  Recommendations/Changes made from today's visit: Continue current medication therapy and start amlodipine as previously prescribed. Keep a log of all weights, blood pressure and sugars and bring to all appts.  Plan: F/U in 3 months   Subjective: Yeshua Stryker is an 65 y.o. year old male who is a primary patient of Humphrey Rolls, Timoteo Gaul, MD.  The CCM team was consulted for assistance with disease management and care coordination needs.    Engaged with patient face to face for initial visit in response to provider referral for pharmacy case management and/or care coordination services.   Consent to Services:  The patient was given the following information about Chronic Care Management services today, agreed to services, and gave verbal consent: 1. CCM service includes personalized support from designated clinical staff supervised by the primary care provider, including individualized plan of care and coordination with other care providers 2. 24/7 contact phone numbers for assistance for urgent and routine care needs. 3. Service will only be billed when office clinical staff spend 20 minutes or more in a month to coordinate care. 4. Only one practitioner may furnish and bill the service in a calendar month. 5.The patient may stop CCM services at any time (effective at the end of the month) by phone call to the office staff. 6. The patient will be responsible for cost sharing (co-pay) of up to 20% of the  service fee (after annual deductible is met). Patient agreed to services and consent obtained.  Patient Care Team: Lavera Guise, MD as PCP - General (Internal Medicine) [name], MD as PCP - Cardiology (Cardiology) [name], MD as Consulting Physician (Pulmonary Disease) Alena Bills, [facility] as Pharmacist (Pharmacist)  Recent office visits: 03/23/21 [name], NP. For [employer]. CHANGED Amlodipine to 10 mg daily.   02/24/21 [name], NP. For follow-up. STARTED Amlodipine Besylate 5 mg daily. STOPPED Prednisone, Doxycyline, and SPIRIVA HANDIHALER 11/30/20 Dr. Humphrey Rolls For follow-up. No medication changes. Per note: Patient was sent to the ER.  11/02/20 McDonough, lauren K, PA-C. For follow-up. No medication changes. Per note:Patient requesting Toujeo sample today 1 box Toujeo given.  Recent consult visits: 12/08/20 Maryellen Pile, Monia Sabal, MD. For Recurrence abscess. STARTED Doxycycline Hyclate 100 mg pack. 10/30/20 Cardiology Dunn, Areta Haber, PA-C. For follow-up. STOPPED Rosuvastatin.  10/15/20 Ezekiel Slocumb, MD. For open wound. No medication changes.  10/13/20 Ezekiel Slocumb, MD. For suture removal No medication changes.   Hospital visits: 03/02/21 [facility] (5 Days) For Cellulitis of right arm. Per note: As we discussed, your insulin TOUJEO at 26 units daily is likely too high.  I have lowered it to 10 units daily. 03/01/21 [facility] Emergency Department (1 Hour) Vladimir Crofts, MD. For Cellulitis. STARTED Cefdinir 300 mg daily.  02/18/21 [facility] Emergency Department (5 Hours) Delman Kitten, MD. For Chest pain. No medication changes.  01/28/21 [facility] Emergency Department (27 hours) Nance Pear, MD For COPD. STARTED Doxycycline  100 mg every 12 hours and Prednisone 40 mg daily. 01/07/21 [facility] (2 Days) Fritzi Mandes, MD. For chest  pain.STOPPED Doxycyline. 11/30/20 [facility] Emergency Department (1 Hour) Lavonia Drafts, MD. For left flank pain. No medication changes. 11/24/20 [facility] Emergency Department (7 hours) Paulette Blanch, MD. For headache. STARTED Meclizine 25 mg 3 times daily PRN. 11/16/20 [facility] (2 Days) Wyvonnia Dusky, MD. For Syncope and Collapse. No medication changes.  11/02/20  [facility] Emergency Department (2 Hours) Arta Silence, MD. For influenza. No medication changes. 10/31/20 [facility] Emergency Department (5 hours) Nolene Ebbs, MD. For COPD Exacerbation. No medication changes.  10/30/20 [facility] ( 27 Hours) Lorella Nimrod, MD. For chest pain. Per note:As we discussed you have flu, please takeTamiflu for 5 days.You are also being given steroids for 4 more days.You can use your pain medication to help with this chest pain if needed. 10/15/20 [facility] Emergency Department (22 Hours) Ward, Delice Bison, DO. For shortness of breath. No medication changes.   Objective:  Lab Results  Component Value Date   CREATININE 1.87 (H) 03/07/2021   BUN 32 (H) 03/07/2021   GFRNONAA 39 (L) 03/07/2021   GFRAA 51 (L) 02/18/2020   NA 138 03/07/2021   K 4.1 03/07/2021   CALCIUM 8.9 03/07/2021   CO2 23 03/07/2021   GLUCOSE 289 (H) 03/07/2021    Lab Results  Component Value Date/Time   HGBA1C 6.8 (A) 03/23/2021 11:13 AM   HGBA1C 6.2 (H) 01/07/2021 09:55 PM   HGBA1C 7.6 (H) 10/06/2020 06:13 PM   HGBA1C 6.9 (H) 12/06/2012 07:32 AM   HGBA1C 11.1 (H) 09/05/2011 09:35 AM    Last diabetic Eye exam: No results found for: HMDIABEYEEXA  Last diabetic Foot exam: No results found for: HMDIABFOOTEX   Lab Results  Component Value Date   CHOL 131 10/07/2020   HDL 40 (L) 10/07/2020   LDLCALC 63 10/07/2020   TRIG 142 10/07/2020   CHOLHDL 3.3 10/07/2020     Hepatic Function Latest Ref Rng & Units 03/04/2021 02/18/2021 11/24/2020  Total Protein 6.5 - 8.1 g/dL 6.3(L) 6.6 6.8  Albumin 3.5 - 5.0 g/dL 2.8(L) 3.1(L) 2.8(L)  AST 15 - 41 U/L '26 24 20  ' ALT 0 - 44 U/L '24 20 22  ' Alk Phosphatase 38 - 126 U/L 62 69 71  Total Bilirubin 0.3 - 1.2 mg/dL 0.4 0.5 0.6  Bilirubin, Direct 0.0 - 0.2 mg/dL - 0.1 -    Lab Results  Component Value Date/Time   TSH 1.561 10/06/2020 07:11 PM   TSH 1.738 01/29/2020 10:27 PM   TSH 1.550 02/27/2018 08:44 AM   FREET4 0.84 02/27/2018 08:44 AM    CBC Latest Ref Rng & Units 03/07/2021 03/06/2021 03/04/2021  WBC 4.0 - 10.5 K/uL 8.3 6.0 5.8  Hemoglobin 13.0 - 17.0 g/dL 10.8(L) 9.3(L) 9.4(L)  Hematocrit 39.0 - 52.0 % 34.8(L) 29.6(L) 28.7(L)  Platelets 150 - 400 K/uL 256 211 182    No results found for: VD25OH  Clinical ASCVD: Yes  The ASCVD Risk score (Arnett DK, et al., 2019) failed to calculate for the following reasons:   The patient has a prior MI or stroke diagnosis    Depression screen [facility] 2/9 03/23/2021 02/23/2021 11/02/2020  Decreased Interest 0 0 0  Down, Depressed, Hopeless 0 0 0  PHQ - 2 Score 0 0 0  Some recent data might be hidden  Social History   Tobacco Use  Smoking Status Former   Types: Cigarettes  Smokeless Tobacco Former   Types: Chew   BP Readings from Last 3 Encounters:  03/23/21 (!) 155/85  03/07/21 (!) 161/100  03/01/21 128/68   Pulse Readings from Last 3 Encounters:  03/23/21 73  03/07/21 60  03/01/21 (!) 107   Wt Readings from Last 3 Encounters:  03/23/21 267 lb 12.8 oz (121.5 kg)  03/03/21 270 lb (122.5 kg)  03/01/21 270 lb (122.5 kg)   BMI Readings from Last 3 Encounters:  03/23/21 37.35 kg/m  03/03/21 37.66 kg/m  03/01/21 37.66 kg/m    Assessment/Interventions: Review of patient past medical history, allergies, medications, health status, including review of consultants reports, laboratory and other test data, was performed as part of comprehensive  evaluation and provision of chronic care management services.   SDOH:  (Social Determinants of Health) assessments and interventions performed: Yes  SDOH Screenings   Alcohol Screen: Low Risk    Last Alcohol Screening Score (AUDIT): 0  Depression (PHQ2-9): Low Risk    PHQ-2 Score: 0  Financial Resource Strain: Low Risk    Difficulty of Paying Living Expenses: Not very hard  Food Insecurity: Not on file  Housing: Not on file  Physical Activity: Not on file  Social Connections: Not on file  Stress: Not on file  Tobacco Use: Medium Risk   Smoking Tobacco Use: Former   Smokeless Tobacco Use: Former   Passive Exposure: Not on file  Transportation Needs: Not on file    [place]  Allergies  Allergen Reactions   Penicillins Rash    Medications Reviewed Today     Reviewed by Alena Bills, [facility] (Pharmacist) on 05/12/21 at 1002  Med List Status: <None>   Medication Order Taking? Sig Documenting Provider Last Dose Status Informant  albuterol (VENTOLIN HFA) 108 (90 Base) MCG/ACT inhaler [id number]  Inhale 2 puffs into the lungs every 6 (six) hours as needed for wheezing or shortness of breath. Lavera Guise, MD  Active Self  amLODipine (NORVASC) 10 MG tablet [id number]  Take 1 tablet (10 mg total) by mouth daily. [name], NP  Active   apixaban (ELIQUIS) 2.5 MG TABS tablet [id number]  Take 1 tablet (2.5 mg total) by mouth 2 (two) times daily. [name], NP  Active   Blood Glucose Calibration (TRUE METRIX LEVEL 1) Low SOLN [id number]  Use as directed [name], NP  Active Self  Blood Glucose Monitoring Suppl (TRUE METRIX AIR GLUCOSE METER) DEVI [id number]  1 Device by Does not apply route 2 (two) times daily. [name], NP  Active Self  Budeson-Glycopyrrol-Formoterol (BREZTRI AEROSPHERE) 160-9-4.8 MCG/ACT Hollie Salk [id number]  Inhale 2 puffs into the lungs in the morning and at bedtime. [name], NP  Active Self  carvedilol (COREG) 12.5 MG tablet  [id number]  Take 1 tablet (12.5 mg total) by mouth 2 (two) times daily. Arta Silence, MD  Active Self  cetirizine (ZYRTEC) 10 MG tablet [id number]  Take 10 mg by mouth daily. [provider]  Active Self  escitalopram (LEXAPRO) 10 MG tablet [id number] No Take one tab po qd for anxiety with food  Patient not taking: Reported on 05/12/2021   [name], NP Not Taking Active Self  furosemide (LASIX) 40 MG tablet [id number]  Take one to tabs once or twice days for fluid // swelling [name], NP  Active Self  gabapentin (NEURONTIN) 100 MG capsule [id number]  Take 1 capsule (100 mg  total) by mouth in the morning, at noon, and at bedtime. [name], NP  Active Self  glimepiride (AMARYL) 2 MG tablet [id number]  Take one tab po qd with supper [name], NP  Active Self  glucose blood (TRUE METRIX BLOOD GLUCOSE TEST) test strip [id number]  Use as instructed twice daily diag E11.65 [name], NP  Active Self  hydrALAZINE (APRESOLINE) 100 MG tablet [id number]  Take 1 tablet (100 mg total) by mouth 3 (three) times daily. [name], NP  Active Self  insulin glargine, 1 Unit Dial, (TOUJEO) 300 UNIT/ML Solostar Pen [id number]  Inject 10 Units into the skin daily. Reduced from prior 26 units daily. Enzo Bi, MD  Active   ipratropium-albuterol (DUONEB) 0.5-2.5 (3) MG/3ML SOLN [id number]  Take 3 mLs by nebulization every 6 (six) hours as needed. Lavera Guise, MD  Active Self  isosorbide mononitrate (IMDUR) 30 MG 24 hr tablet [id number]  Take 1 tablet (30 mg total) by mouth 2 (two) times daily. Jennye Boroughs, MD  Active Self  meclizine (ANTIVERT) 25 MG tablet [id number]  Take 1 tablet (25 mg total) by mouth 3 (three) times daily as needed for dizziness or nausea. Paulette Blanch, MD  Active Self  NOVOFINE PEN NEEDLE 32G X 6 MM MISC [id number]  Use as directed Lavera Guise, MD  Active Self  omeprazole (PRILOSEC) 40 MG capsule [id number]  Take 1 capsule (40 mg total) by  mouth in the morning and at bedtime. Lavera Guise, MD  Active Self  potassium chloride SA (KLOR-CON) 20 MEQ tablet [id number]  Take 1 tablet (20 mEq total) by mouth 2 (two) times daily as needed. When you take Lasix. Enzo Bi, MD  Active   rosuvastatin (CRESTOR) 10 MG tablet [id number]  Take 1 tablet (10 mg total) by mouth at bedtime. [name], NP  Active Self  TRUEplus Lancets 33G MISC [id number]  Use as directed twice daily diag E11.65 [name], NP  Active Self  vitamin B-12 1000 MCG tablet [id number]  Take 1 tablet (1,000 mcg total) by mouth daily. Fritzi Mandes, MD  Active Self            Patient Active Problem List   Diagnosis Date Noted   PAD (peripheral artery disease) ([place])    Cellulitis of right arm 03/02/2021   Chronic anticoagulation 03/02/2021   COPD with acute exacerbation ([place]) 01/29/2021   Atrial flutter ([place]) 01/07/2021   Syncope and collapse 11/16/2020   Demand ischemia ([place])    Influenza A 10/30/2020   HLD (hyperlipidemia) 10/06/2020   History of CVA (cerebrovascular accident) 09/25/2020   Gastroesophageal reflux disease without esophagitis 04/19/2020   Pneumonia due to COVID-19 virus 01/30/2020   Elevated troponin I level 01/29/2020   Suspected COVID-19 virus infection 01/29/2020   Lightheadedness 01/29/2020   Hematemesis 01/09/2020   Hospital discharge follow-up 11/26/2019   Generalized weakness 08/08/2019   Obesity (BMI 30-39.9) 08/08/2019   Acute right hip pain 06/17/2019   Inability to ambulate due to hip 06/17/2019   CKD stage 3 due to type 2 diabetes mellitus (HCC)    Hypervolemia    Type 2 diabetes mellitus with stage 3b chronic kidney disease, with long-term current use of insulin ([place])    Hypomagnesemia    Full code status 05/20/2019   Pleuritic chest pain    Multifocal pneumonia 05/19/2019   Acute kidney injury superimposed on CKD 3b ([place]) 05/09/2019   Hemoptysis 04/09/2019   Shortness of breath 03/26/2019  Uncontrolled type 2  diabetes mellitus with insulin therapy 03/12/2019   Acute on chronic heart failure with preserved ejection fraction (HFpEF) (HCC)    Stage 3b chronic kidney disease ([place])    Chronic systolic CHF (congestive heart failure) ([place]) 71/16/5790   Diastolic dysfunction 38/33/3832   Iron deficiency anemia 12/31/2018   Atopic dermatitis 11/24/2018   Screening for colon cancer 11/06/2017   Uncontrolled type 2 diabetes mellitus with hyperglycemia ([place]) 11/06/2017   Hidradenitis 06/09/2017   Atherosclerotic heart disease of native coronary artery without angina pectoris 06/09/2017   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   Obstructive sleep apnea, adult 06/09/2017   Morbid (severe) obesity due to excess calories ([place]) 06/09/2017   Cigarette nicotine dependence with nicotine-induced disorder 06/09/2017   Diabetic polyneuropathy associated with type 2 diabetes mellitus ([place]) 06/09/2017   Allergic rhinitis due to pollen 06/09/2017   Mixed hyperlipidemia 06/09/2017   COPD (chronic obstructive pulmonary disease) ([place]) 06/09/2017   Dyspnea on exertion 06/09/2017   Wheezing 06/09/2017   Dysuria 06/09/2017   Snoring 06/09/2017   Essential hypertension 06/09/2017   Personal history of other malignant neoplasm of kidney 06/09/2017   Pain in right hip 06/09/2017   Impacted cerumen, bilateral 06/09/2017   Secondary hyperparathyroidism, not elsewhere classified ([place]) 06/09/2017   Type 2 diabetes mellitus with hyperglycemia ([place]) 06/09/2017   Tinea corporis 06/09/2017    Immunization History  Administered Date(s) Administered   Fluad Quad(high Dose 65+) 02/26/2020   Influenza Inj Mdck Quad Pf 03/23/2021   Influenza,inj,quad, With Preservative 04/03/2017, 03/13/2018, 03/13/2019   Influenza-Unspecified 03/31/2017, 03/09/2018   Pneumococcal-Unspecified 06/19/2015    Conditions to be addressed/monitored:  Hypertension, Hyperlipidemia, Diabetes, Heart Failure, Coronary  Artery Disease, GERD, COPD, Chronic Kidney Disease, and Obesity  Care Plan : [place]  Updates made by Alena Bills, [place] since 05/12/2021 12:00 AM     Problem: T2DM, HTN, CHF, COPD, Obesity, CKD, GERD   Priority: High  Onset Date: 05/12/2021     Long-Range Goal: Disease Management   Start Date: 05/12/2021  Expected End Date: 05/12/2022  This Visit's Progress: On track  Priority: High  Note:   Current Barriers:  Does not adhere to prescribed medication regimen. Forgets what his medications are for.  Pharmacist Clinical Goal(s):  Patient will achieve adherence to monitoring guidelines and medication adherence to achieve therapeutic efficacy through collaboration with PharmD and provider.   Interventions: 1:1 collaboration with Lavera Guise, MD regarding development and update of comprehensive plan of care as evidenced by provider attestation and co-signature Inter-disciplinary care team collaboration (see longitudinal plan of care) Comprehensive medication review performed; medication list updated in electronic medical record  Hypertension (BP goal <130/80) -Uncontrolled -Current treatment: Amlodipine 73m daily Carvedilol 12.568mtwice daily Lasix 407mnce to twice daily as needed Hydralazine 100m40mree times daily -Medications previously tried: Benazepril, HCTZ, Metoprolol  -Current home readings: cannot recall any readings but states he takes it daily -Current dietary habits: see DM -Current exercise habits: walks length of his trailer about 10 times a day -Denies hypotensive/hypertensive symptoms -Educated on BP goals and benefits of medications for prevention of heart attack, stroke and kidney damage; Exercise goal of 150 minutes per week; Importance of home blood pressure monitoring; Proper BP monitoring technique; -Counseled to monitor BP at home one daily, document, and provide log at future appointments -Counseled on diet and exercise  extensively Recommended to continue current medication Reminded patient to pick up his Amlodipine  Hyperlipidemia: (LDL goal <  70) -Controlled -Current treatment: Rosuvastatin 65m daily -Medications previously tried: None noted  -Current dietary patterns: see DM -Current exercise habits: see above -Educated on Cholesterol goals;  Benefits of statin for ASCVD risk reduction; Importance of limiting foods high in cholesterol; Exercise goal of 150 minutes per week; -Counseled on diet and exercise extensively Recommended to continue current medication -Reminded patient he is due for his labs Diabetes (A1c goal <7%) -Controlled -Current medications: Glimepiride 222mdaily with supper Toujeo 300 units/ml 10 units into skin daily -Medications previously tried: Tradjenta, Glyburide/Metformin  -Current home glucose readings fasting glucose: does not recall readings but reports no lows (<70) or highs (>130) post prandial glucose: does not take -Denies hypoglycemic/hyperglycemic symptoms -Current meal patterns:  breakfast: cereal (low sugar) and coffee with splenda  lunch: sandwich  dinner: fish or chicken with no sides snacks: none drinks: water -Current exercise: see above -Educated on A1c and blood sugar goals; Complications of diabetes including kidney damage, retinal damage, and cardiovascular disease; Exercise goal of 150 minutes per week; Benefits of routine self-monitoring of blood sugar; Counseled to check feet daily and get yearly eye exams -Counseled to check feet daily and get yearly eye exams -Counseled on diet and exercise extensively Recommended to continue current medication  Heart Failure (Goal: manage symptoms and prevent exacerbations) -Not ideally controlled -HF type: Systolic -NYHA Class: II (slight limitation of activity) -AHA HF Stage: B (Heart disease present - no symptoms present) -Current treatment: Amlodipine 1083maily Carvedilol 12.5mg35mice  daily Lasix 40mg73me to twice daily Isosorbide mononitrate 30mg 69mbs daily Rosuvastatin 10mg d54m -Medications previously tried: None noted  -Current home BP/HR readings: see above -Current dietary habits: see above -Current exercise habits: see above -Educated on Benefits of medications for managing symptoms and prolonging life Importance of weighing daily; if you gain more than 3 pounds in one day or 5 pounds in one week,  Importance of blood pressure control -Counseled on diet and exercise extensively Recommended to continue current medication  COPD (Goal: control symptoms and prevent exacerbations) -Controlled -Current treatment  Albuterol 2 puffs every 6 hours as needed Breztri 2 puffs every morning and at bedtime Duoneb 1 vial every 6 hours as needed Cetirizine 10mg da2m-Medications previously tried: None noted  -Current COPD Classification:  A (low sx, <2 exacerbations/yr) -Exacerbations requiring treatment in last 6 months: Yes -Patient reports consistent use of maintenance inhaler -Frequency of rescue inhaler use: does not use -Counseled on Proper inhaler technique; Benefits of consistent maintenance inhaler use Differences between maintenance and rescue inhalers -Recommended to continue current medication  CAD (Goal: prevention of cardiac event) -Controlled -Current treatment  Eliquis 2.5mg twic47maily -Medications previously tried: none noted  -Recommended to continue current medication  GERD (Goal: no acid reflux) -Controlled -Current treatment  Omeprazole 40mg in m20mng and at bedtime -Medications previously tried: none noted -May consider decreasing dose and trying to titrate off in the future  -Recommended to continue current medication -Patient out of refills, will request  CKD (Goal: imrovement or no worsening of kidney function) -Not ideally controlled -Not on ACE or ARB -History of Benazepril but d/c due to bump in Cr -Will consider  addition of ACE or ARB at next appt   Patient Goals/Self-Care Activities Patient will:  - take medications as prescribed as evidenced by patient report and record review check glucose daily, document, and provide at future appointments check blood pressure daily, document, and provide at future appointments weigh daily, and contact provider if weight  gain of 3 lbs in one day or 5 lbs in one week target a minimum of 150 minutes of moderate intensity exercise weekly  Follow Up Plan: Telephone follow up appointment with care management team member scheduled for: 3 months       Medication Assistance: None required.  Patient affirms current coverage meets needs.  Compliance/Adherence/Medication fill history: Care Gaps: Diabetic Eye Exam Pneumonia Vaccine  Star-Rating Drugs: Rosuvastatin 10 mg 12/12/20 90 DS. Glimepiride 2 mg 01/11/21 90 DS.  Patient's preferred pharmacy is:  [facility], [place] - [address] [place] [address] Phone: [phone] Fax: [phone]  Uses pill box? Yes Pt endorses 100% compliance  We discussed: Benefits of medication synchronization, packaging and delivery as well as enhanced pharmacist oversight with [employer]. Patient decided to:  Continue current pharmacy strategy. Advised patient that he would highly benefit from adherence packaging and he will consider in future.  Care Plan and Follow Up Patient Decision:  Patient agrees to Care Plan and Follow-up.  Plan: Telephone follow up appointment with care management team member scheduled for:  3 months   [place] Pharmacist [phone]

## 2021-05-12 ENCOUNTER — Other Ambulatory Visit: Payer: Self-pay

## 2021-05-12 ENCOUNTER — Other Ambulatory Visit: Payer: Self-pay | Admitting: Internal Medicine

## 2021-05-12 ENCOUNTER — Ambulatory Visit: Payer: Medicare PPO | Admitting: Student-PharmD

## 2021-05-12 DIAGNOSIS — K219 Gastro-esophageal reflux disease without esophagitis: Secondary | ICD-10-CM

## 2021-05-12 DIAGNOSIS — I5022 Chronic systolic (congestive) heart failure: Secondary | ICD-10-CM | POA: Diagnosis not present

## 2021-05-12 DIAGNOSIS — J449 Chronic obstructive pulmonary disease, unspecified: Secondary | ICD-10-CM

## 2021-05-12 DIAGNOSIS — Z794 Long term (current) use of insulin: Secondary | ICD-10-CM

## 2021-05-12 DIAGNOSIS — I700 Atherosclerosis of aorta: Secondary | ICD-10-CM

## 2021-05-12 DIAGNOSIS — N1832 Chronic kidney disease, stage 3b: Secondary | ICD-10-CM

## 2021-05-12 DIAGNOSIS — I10 Essential (primary) hypertension: Secondary | ICD-10-CM | POA: Diagnosis not present

## 2021-05-12 MED ORDER — OMEPRAZOLE 40 MG PO CPDR: 40.0000 mg | DELAYED_RELEASE_CAPSULE | Freq: Two times a day (BID) | ORAL | 0 refills | Status: DC

## 2021-05-12 NOTE — Patient Instructions (Addendum)
Visit Information   Goals Addressed             This Visit's Progress    Manage My Medicine   On track    Timeframe:  Long-Range Goal Priority:  High Start Date:  05/12/21                       Expected End Date:  05/12/22                     Follow Up Date 07/2021   - call for medicine refill 2 or 3 days before it runs out - keep a list of all the medicines I take; vitamins and herbals too    Why is this important?   These steps will help you keep on track with your medicines.   Notes:      Perform Foot Care-Diabetes Type 2   On track    Timeframe:  Long-Range Goal Priority:  High Start Date:     05/12/21                        Expected End Date:   05/12/22                    Follow Up Date 07/2021   - check feet daily for cuts, sores or redness - wash and dry feet carefully every day - wear comfortable, cotton socks    Why is this important?   Good foot care is very important when you have diabetes.  There are many things you can do to keep your feet healthy and catch a problem early.    Notes:      Track and Manage My Blood Pressure-Hypertension   On track    Timeframe:  Long-Range Goal Priority:  High Start Date:       05/12/21                      Expected End Date:  05/12/22                     Follow Up Date 07/2021   - check blood pressure daily - choose a place to take my blood pressure (home, clinic or office, retail store) - write blood pressure results in a log or diary    Why is this important?   You won't feel high blood pressure, but it can still hurt your blood vessels.  High blood pressure can cause heart or kidney problems. It can also cause a stroke.  Making lifestyle changes like losing a little weight or eating less salt will help.  Checking your blood pressure at home and at different times of the day can help to control blood pressure.  If the doctor prescribes medicine remember to take it the way the doctor ordered.  Call the office if  you cannot afford the medicine or if there are questions about it.     Notes:        Patient Care Plan: CCM Pharmacy Care Plan     Problem Identified: T2DM, HTN, CHF, COPD, Obesity, CKD, GERD   Priority: High  Onset Date: 05/12/2021     Long-Range Goal: Disease Management   Start Date: 05/12/2021  Expected End Date: 05/12/2022  This Visit's Progress: On track  Priority: High  Note:   Current Barriers:  Does not adhere to prescribed medication regimen.  Forgets what his medications are for.  Pharmacist Clinical Goal(s):  Patient will achieve adherence to monitoring guidelines and medication adherence to achieve therapeutic efficacy through collaboration with PharmD and provider.   Interventions: 1:1 collaboration with Lavera Guise, MD regarding development and update of comprehensive plan of care as evidenced by provider attestation and co-signature Inter-disciplinary care team collaboration (see longitudinal plan of care) Comprehensive medication review performed; medication list updated in electronic medical record  Hypertension (BP goal <130/80) -Uncontrolled -Current treatment: Amlodipine 10mg  daily Carvedilol 12.5mg  twice daily Lasix 40mg  once to twice daily as needed Hydralazine 100mg  three times daily -Medications previously tried: Benazepril, HCTZ, Metoprolol  -Current home readings: cannot recall any readings but states he takes it daily -Current dietary habits: see DM -Current exercise habits: walks length of his trailer about 10 times a day -Denies hypotensive/hypertensive symptoms -Educated on BP goals and benefits of medications for prevention of heart attack, stroke and kidney damage; Exercise goal of 150 minutes per week; Importance of home blood pressure monitoring; Proper BP monitoring technique; -Counseled to monitor BP at home one daily, document, and provide log at future appointments -Counseled on diet and exercise extensively Recommended to  continue current medication Reminded patient to pick up his Amlodipine  Hyperlipidemia: (LDL goal < 70) -Controlled -Current treatment: Rosuvastatin 10mg  daily -Medications previously tried: None noted  -Current dietary patterns: see DM -Current exercise habits: see above -Educated on Cholesterol goals;  Benefits of statin for ASCVD risk reduction; Importance of limiting foods high in cholesterol; Exercise goal of 150 minutes per week; -Counseled on diet and exercise extensively Recommended to continue current medication -Reminded patient he is due for his labs Diabetes (A1c goal <7%) -Controlled -Current medications: Glimepiride 2mg  daily with supper Toujeo 300 units/ml 10 units into skin daily -Medications previously tried: Tradjenta, Glyburide/Metformin  -Current home glucose readings fasting glucose: does not recall readings but reports no lows (<70) or highs (>130) post prandial glucose: does not take -Denies hypoglycemic/hyperglycemic symptoms -Current meal patterns:  breakfast: cereal (low sugar) and coffee with splenda  lunch: sandwich  dinner: fish or chicken with no sides snacks: none drinks: water -Current exercise: see above -Educated on A1c and blood sugar goals; Complications of diabetes including kidney damage, retinal damage, and cardiovascular disease; Exercise goal of 150 minutes per week; Benefits of routine self-monitoring of blood sugar; Counseled to check feet daily and get yearly eye exams -Counseled to check feet daily and get yearly eye exams -Counseled on diet and exercise extensively Recommended to continue current medication  Heart Failure (Goal: manage symptoms and prevent exacerbations) -Not ideally controlled -HF type: Systolic -NYHA Class: II (slight limitation of activity) -AHA HF Stage: B (Heart disease present - no symptoms present) -Current treatment: Amlodipine 10mg  daily Carvedilol 12.5mg  twice daily Lasix 40mg  once to twice  daily Isosorbide mononitrate 30mg  2 tabs daily Rosuvastatin 10mg  daily -Medications previously tried: None noted  -Current home BP/HR readings: see above -Current dietary habits: see above -Current exercise habits: see above -Educated on Benefits of medications for managing symptoms and prolonging life Importance of weighing daily; if you gain more than 3 pounds in one day or 5 pounds in one week,  Importance of blood pressure control -Counseled on diet and exercise extensively Recommended to continue current medication  COPD (Goal: control symptoms and prevent exacerbations) -Controlled -Current treatment  Albuterol 2 puffs every 6 hours as needed Breztri 2 puffs every morning and at bedtime Duoneb 1 vial every 6 hours as needed Cetirizine 10mg   daily -Medications previously tried: None noted  -Current COPD Classification:  A (low sx, <2 exacerbations/yr) -Exacerbations requiring treatment in last 6 months: Yes -Patient reports consistent use of maintenance inhaler -Frequency of rescue inhaler use: does not use -Counseled on Proper inhaler technique; Benefits of consistent maintenance inhaler use Differences between maintenance and rescue inhalers -Recommended to continue current medication  CAD (Goal: prevention of cardiac event) -Controlled -Current treatment  Eliquis 2.5mg  twice daily -Medications previously tried: none noted  -Recommended to continue current medication  GERD (Goal: no acid reflux) -Controlled -Current treatment  Omeprazole 40mg  in morning and at bedtime -Medications previously tried: none noted -May consider decreasing dose and trying to titrate off in the future  -Recommended to continue current medication -Patient out of refills, will request  CKD (Goal: imrovement or no worsening of kidney function) -Not ideally controlled -Not on ACE or ARB -History of Benazepril but d/c due to bump in Cr -Will consider addition of ACE or ARB at next  appt   Patient Goals/Self-Care Activities Patient will:  - take medications as prescribed as evidenced by patient report and record review check glucose daily, document, and provide at future appointments check blood pressure daily, document, and provide at future appointments weigh daily, and contact provider if weight gain of 3 lbs in one day or 5 lbs in one week target a minimum of 150 minutes of moderate intensity exercise weekly  Follow Up Plan: Telephone follow up appointment with care management team member scheduled for: 3 months     Mr. Lozinski was given information about Chronic Care Management services today including:  CCM service includes personalized support from designated clinical staff supervised by his physician, including individualized plan of care and coordination with other care providers 24/7 contact phone numbers for assistance for urgent and routine care needs. Standard insurance, coinsurance, copays and deductibles apply for chronic care management only during months in which we provide at least 20 minutes of these services. Most insurances cover these services at 100%, however patients may be responsible for any copay, coinsurance and/or deductible if applicable. This service may help you avoid the need for more expensive face-to-face services. Only one practitioner may furnish and bill the service in a calendar month. The patient may stop CCM services at any time (effective at the end of the month) by phone call to the office staff.  Patient agreed to services and verbal consent obtained.   The patient verbalized understanding of instructions, educational materials, and care plan provided today and agreed to receive a mailed copy of patient instructions, educational materials, and care plan.  Telephone follow up appointment with pharmacy team member scheduled for: 3 months  Alena Bills, [place]

## 2021-05-13 ENCOUNTER — Telehealth: Payer: Self-pay | Admitting: Student-PharmD

## 2021-05-13 NOTE — Progress Notes (Signed)
  Chronic Care Management Pharmacy Assistant   Name: Larry Callahan  MRN: [id number] DOB: April 26, 1956  Reason for Encounter: CCM Care Plan  Medications: Outpatient Encounter Medications as of 05/13/2021  Medication Sig   albuterol (VENTOLIN HFA) 108 (90 Base) MCG/ACT inhaler Inhale 2 puffs into the lungs every 6 (six) hours as needed for wheezing or shortness of breath.   amLODipine (NORVASC) 10 MG tablet Take 1 tablet (10 mg total) by mouth daily.   apixaban (ELIQUIS) 2.5 MG TABS tablet Take 1 tablet (2.5 mg total) by mouth 2 (two) times daily.   Blood Glucose Calibration (TRUE METRIX LEVEL 1) Low SOLN Use as directed   Blood Glucose Monitoring Suppl (TRUE METRIX AIR GLUCOSE METER) DEVI 1 Device by Does not apply route 2 (two) times daily.   Budeson-Glycopyrrol-Formoterol (BREZTRI AEROSPHERE) 160-9-4.8 MCG/ACT AERO Inhale 2 puffs into the lungs in the morning and at bedtime.   carvedilol (COREG) 12.5 MG tablet Take 1 tablet (12.5 mg total) by mouth 2 (two) times daily.   cetirizine (ZYRTEC) 10 MG tablet Take 10 mg by mouth daily.   escitalopram (LEXAPRO) 10 MG tablet Take one tab po qd for anxiety with food (Patient not taking: Reported on 05/12/2021)   furosemide (LASIX) 40 MG tablet Take one to tabs once or twice days for fluid // swelling   gabapentin (NEURONTIN) 100 MG capsule Take 1 capsule (100 mg total) by mouth in the morning, at noon, and at bedtime.   glimepiride (AMARYL) 2 MG tablet Take one tab po qd with supper   glucose blood (TRUE METRIX BLOOD GLUCOSE TEST) test strip Use as instructed twice daily diag E11.65   hydrALAZINE (APRESOLINE) 100 MG tablet Take 1 tablet (100 mg total) by mouth 3 (three) times daily.   insulin glargine, 1 Unit Dial, (TOUJEO) 300 UNIT/ML Solostar Pen Inject 10 Units into the skin daily. Reduced from prior 26 units daily.   ipratropium-albuterol (DUONEB) 0.5-2.5 (3) MG/3ML SOLN Take 3 mLs by nebulization every 6 (six) hours as needed.   isosorbide  mononitrate (IMDUR) 30 MG 24 hr tablet Take 1 tablet (30 mg total) by mouth 2 (two) times daily.   meclizine (ANTIVERT) 25 MG tablet Take 1 tablet (25 mg total) by mouth 3 (three) times daily as needed for dizziness or nausea.   NOVOFINE PEN NEEDLE 32G X 6 MM MISC Use as directed   omeprazole (PRILOSEC) 40 MG capsule Take 1 capsule (40 mg total) by mouth in the morning and at bedtime.   potassium chloride SA (KLOR-CON) 20 MEQ tablet Take 1 tablet (20 mEq total) by mouth 2 (two) times daily as needed. When you take Lasix.   rosuvastatin (CRESTOR) 10 MG tablet Take 1 tablet (10 mg total) by mouth at bedtime.   TRUEplus Lancets 33G MISC Use as directed twice daily diag E11.65   vitamin B-12 1000 MCG tablet Take 1 tablet (1,000 mcg total) by mouth daily.   No facility-administered encounter medications on file as of 05/13/2021.   Reviewed the patients initial visit reinsured it was completed per the pharmacist Alena Bills request. Printed the CCM care plan. Mailed the patient CCM care plan to their most recent address on file.   Follow-Up:Pharmacist Review  Charlann Lange, [place] Pharmacist Assistant [phone]

## 2021-05-20 ENCOUNTER — Other Ambulatory Visit: Payer: Self-pay

## 2021-05-20 ENCOUNTER — Encounter: Payer: Self-pay | Admitting: Nurse Practitioner

## 2021-05-20 ENCOUNTER — Ambulatory Visit: Payer: Medicare PPO | Admitting: Nurse Practitioner

## 2021-05-20 VITALS — BP 143/67 | HR 72 | Temp 98.5°F | Resp 16 | Ht 71.0 in | Wt 280.0 lb

## 2021-05-20 DIAGNOSIS — L732 Hidradenitis suppurativa: Secondary | ICD-10-CM

## 2021-05-20 DIAGNOSIS — I10 Essential (primary) hypertension: Secondary | ICD-10-CM

## 2021-05-20 DIAGNOSIS — Z8673 Personal history of transient ischemic attack (TIA), and cerebral infarction without residual deficits: Secondary | ICD-10-CM

## 2021-05-20 MED ORDER — CLINDAMYCIN PHOSPHATE 1 % EX GEL: CUTANEOUS | 5 refills | Status: DC

## 2021-05-20 MED ORDER — APIXABAN 2.5 MG PO TABS: 2.5000 mg | ORAL_TABLET | Freq: Two times a day (BID) | ORAL | 5 refills | Status: DC

## 2021-05-20 NOTE — Progress Notes (Signed)
[facility] [place], [place] [address]  Internal MEDICINE  Office Visit Note  Patient Name: Larry Callahan  [id number]  [id number]  Date of Service: 06/20/2021  Chief Complaint  Patient presents with   Acute Visit    Right arm pain and swelling, has seen dermatology and they've opened it 3 times but it has not helped, feels burn in back and chest radiating out from the painful spot under arm      HPI Lamel presents for an acute sick visit for right arm pain and swelling in the axilla area. Reports that he has seen dermatology and they have opened and drained it 3 times but it has not helped. The pain is now radiating to his back and chest from his axilla on the right side.  His last recorded office visit with dermatology was in June 2022.    Current Medication:  Outpatient Encounter Medications as of 05/20/2021  Medication Sig   albuterol (VENTOLIN HFA) 108 (90 Base) MCG/ACT inhaler Inhale 2 puffs into the lungs every 6 (six) hours as needed for wheezing or shortness of breath.   amLODipine (NORVASC) 10 MG tablet Take 1 tablet (10 mg total) by mouth daily.   Blood Glucose Calibration (TRUE METRIX LEVEL 1) Low SOLN Use as directed   Blood Glucose Monitoring Suppl (TRUE METRIX AIR GLUCOSE METER) DEVI 1 Device by Does not apply route 2 (two) times daily.   Budeson-Glycopyrrol-Formoterol (BREZTRI AEROSPHERE) 160-9-4.8 MCG/ACT AERO Inhale 2 puffs into the lungs in the morning and at bedtime.   carvedilol (COREG) 12.5 MG tablet Take 1 tablet (12.5 mg total) by mouth 2 (two) times daily.   cetirizine (ZYRTEC) 10 MG tablet Take 10 mg by mouth daily.   clindamycin (CLINDAGEL) 1 % gel Apply to the affected area two times daily until resolved. Then use as needed for flare ups   escitalopram (LEXAPRO) 10 MG tablet Take one tab po qd for anxiety with food   furosemide (LASIX) 40 MG tablet Take one to tabs once or twice days for fluid // swelling   gabapentin  (NEURONTIN) 100 MG capsule Take 1 capsule (100 mg total) by mouth in the morning, at noon, and at bedtime.   glimepiride (AMARYL) 2 MG tablet Take one tab po qd with supper   glucose blood (TRUE METRIX BLOOD GLUCOSE TEST) test strip Use as instructed twice daily diag E11.65   hydrALAZINE (APRESOLINE) 100 MG tablet Take 1 tablet (100 mg total) by mouth 3 (three) times daily.   insulin glargine, 1 Unit Dial, (TOUJEO) 300 UNIT/ML Solostar Pen Inject 10 Units into the skin daily. Reduced from prior 26 units daily.   ipratropium-albuterol (DUONEB) 0.5-2.5 (3) MG/3ML SOLN Take 3 mLs by nebulization every 6 (six) hours as needed.   isosorbide mononitrate (IMDUR) 30 MG 24 hr tablet Take 1 tablet (30 mg total) by mouth 2 (two) times daily.   meclizine (ANTIVERT) 25 MG tablet Take 1 tablet (25 mg total) by mouth 3 (three) times daily as needed for dizziness or nausea.   NOVOFINE PEN NEEDLE 32G X 6 MM MISC Use as directed   omeprazole (PRILOSEC) 40 MG capsule Take 1 capsule (40 mg total) by mouth in the morning and at bedtime.   potassium chloride SA (KLOR-CON) 20 MEQ tablet Take 1 tablet (20 mEq total) by mouth 2 (two) times daily as needed. When you take Lasix.   rosuvastatin (CRESTOR) 10 MG tablet Take 1 tablet (10 mg total) by mouth  at bedtime.   TRUEplus Lancets 33G MISC Use as directed twice daily diag E11.65   vitamin B-12 1000 MCG tablet Take 1 tablet (1,000 mcg total) by mouth daily.   [DISCONTINUED] apixaban (ELIQUIS) 2.5 MG TABS tablet Take 1 tablet (2.5 mg total) by mouth 2 (two) times daily.   apixaban (ELIQUIS) 2.5 MG TABS tablet Take 1 tablet (2.5 mg total) by mouth 2 (two) times daily.   No facility-administered encounter medications on file as of 05/20/2021.      Medical History: Past Medical History:  Diagnosis Date   Allergy    Chronic combined systolic (congestive) and diastolic (congestive) heart failure ([place])    a. 04/2019 Echo: EF 45-50%; b. 02/2019 Echo: EF 55-60%; c. 09/2020  Echo: EF 35-40%, glob HK. Nl RV size/fxn. Mild MR.   CKD (chronic kidney disease), stage III (HCC)    COPD (chronic obstructive pulmonary disease) ([place])    Coronary artery disease    a. 2011 Cath: nonobs dzs; b. 09/2020 MV: EF 38%. No signif ischemia. ? inf MI vs diaph attenuation. GI uptake artifact.   Diabetes mellitus without complication ([place])    GERD (gastroesophageal reflux disease)    Hyperlipidemia    Hypertension    Morbid obesity ([place])    NICM (nonischemic cardiomyopathy) ([place])    a. 04/2019 Echo: EF 45-50%; b. 02/2019 Echo: EF 55-60%; c. 09/2020 Echo: EF 35-40%, glob HK; d. 09/2020 MV: No ischemia. ? inf infarct vs attenuation.   OSA (obstructive sleep apnea)      Vital Signs: BP (!) 143/67   Pulse 72   Temp 98.5 F (36.9 C)   Resp 16   Ht 5\' 11"  (1.803 m)   Wt 280 lb (127 kg)   SpO2 97%   BMI 39.05 kg/m    Review of Systems  Constitutional:  Negative for chills, fatigue and unexpected weight change.  HENT:  Negative for congestion, rhinorrhea, sneezing and sore throat.   Eyes:  Negative for redness.  Respiratory:  Negative for cough, chest tightness and shortness of breath.   Cardiovascular:  Negative for chest pain and palpitations.  Gastrointestinal:  Negative for abdominal pain, constipation, diarrhea, nausea and vomiting.  Genitourinary:  Negative for dysuria and frequency.  Musculoskeletal:  Negative for arthralgias, back pain, joint swelling and neck pain.  Skin:  Positive for wound (right axilla). Negative for rash.  Neurological: Negative.  Negative for tremors and numbness.  Hematological:  Negative for adenopathy. Does not bruise/bleed easily.  Psychiatric/Behavioral:  Negative for behavioral problems (Depression), sleep disturbance and suicidal ideas. The patient is not nervous/anxious.    Physical Exam Vitals reviewed.  Constitutional:      General: He is not in acute distress.    Appearance: Normal appearance. He is obese. He is not ill-appearing.   HENT:     Head: Normocephalic and atraumatic.  Eyes:     Pupils: Pupils are equal, round, and reactive to light.  Cardiovascular:     Rate and Rhythm: Normal rate and regular rhythm.  Pulmonary:     Effort: Pulmonary effort is normal. No respiratory distress.  Skin:    Findings: Abscess (right axilla; swollen, red, with purulent drainage.) present.  Neurological:     Mental Status: He is alert and oriented to person, place, and time.  Psychiatric:        Mood and Affect: Mood normal.        Behavior: Behavior normal.      Assessment/Plan: 1. Hidradenitis Patient is already  established with Dr. Nehemiah Massed. He has not had an office visit on record with Dr. Nehemiah Massed at [facility] since June 2022. Encouraged patient to schedule an appointment to be seen again. Clindamycin gel prescribed. The abscess needs to be drainaed. Encouraged patient to go to an urgent care and have them drain it or have Dr. Nehemiah Massed drain it when he sees him at his next appointment once he schedules it.  - clindamycin (CLINDAGEL) 1 % gel; Apply to the affected area two times daily until resolved. Then use as needed for flare ups  Dispense: 60 g; Refill: 5  2. History of CVA (cerebrovascular accident) Eliquis refills ordered.  - apixaban (ELIQUIS) 2.5 MG TABS tablet; Take 1 tablet (2.5 mg total) by mouth 2 (two) times daily.  Dispense: 60 tablet; Refill: 5  3. Primary hypertension Blood pressure is slightly elevated but patient is in pain. Reports that his blood pressure has been better at home. Will continue to monitor blood pressure at future office visits.    General Counseling: travonte byard understanding of the findings of todays visit and agrees with plan of treatment. I have discussed any further diagnostic evaluation that may be needed or ordered today. We also reviewed his medications today. he has been encouraged to call the office with any questions or concerns that should arise related to  todays visit.    Counseling:    No orders of the defined types were placed in this encounter.   Meds ordered this encounter  Medications   apixaban (ELIQUIS) 2.5 MG TABS tablet    Sig: Take 1 tablet (2.5 mg total) by mouth 2 (two) times daily.    Dispense:  60 tablet    Refill:  5   clindamycin (CLINDAGEL) 1 % gel    Sig: Apply to the affected area two times daily until resolved. Then use as needed for flare ups    Dispense:  60 g    Refill:  5    Return if symptoms worsen or fail to improve.  [place] Controlled Substance Database was reviewed by me for overdose risk score (ORS)  Time spent:30 Minutes Time spent with patient included reviewing progress notes, labs, imaging studies, and discussing plan for follow up.   This patient was seen by [name], FNP-C in collaboration with Dr. Clayborn Bigness as a part of collaborative care agreement.  Celes Dedic R. Valetta Fuller, MSN, FNP-C Internal Medicine

## 2021-06-11 ENCOUNTER — Other Ambulatory Visit: Payer: Self-pay

## 2021-06-11 ENCOUNTER — Emergency Department
Admission: EM | Admit: 2021-06-11 | Discharge: 2021-06-11 | Disposition: A | Discharge status: Home / Self Care | Payer: Medicare PPO | Attending: Emergency Medicine | Admitting: Emergency Medicine

## 2021-06-11 ENCOUNTER — Emergency Department: Payer: Medicare PPO

## 2021-06-11 DIAGNOSIS — Z79899 Other long term (current) drug therapy: Secondary | ICD-10-CM | POA: Diagnosis not present

## 2021-06-11 DIAGNOSIS — Z794 Long term (current) use of insulin: Secondary | ICD-10-CM | POA: Insufficient documentation

## 2021-06-11 DIAGNOSIS — I5042 Chronic combined systolic (congestive) and diastolic (congestive) heart failure: Secondary | ICD-10-CM | POA: Diagnosis not present

## 2021-06-11 DIAGNOSIS — Z7984 Long term (current) use of oral hypoglycemic drugs: Secondary | ICD-10-CM | POA: Insufficient documentation

## 2021-06-11 DIAGNOSIS — I10 Essential (primary) hypertension: Secondary | ICD-10-CM | POA: Diagnosis not present

## 2021-06-11 DIAGNOSIS — Z8616 Personal history of COVID-19: Secondary | ICD-10-CM | POA: Diagnosis not present

## 2021-06-11 DIAGNOSIS — L732 Hidradenitis suppurativa: Secondary | ICD-10-CM | POA: Diagnosis not present

## 2021-06-11 DIAGNOSIS — R079 Chest pain, unspecified: Secondary | ICD-10-CM | POA: Diagnosis not present

## 2021-06-11 DIAGNOSIS — R609 Edema, unspecified: Secondary | ICD-10-CM | POA: Diagnosis not present

## 2021-06-11 DIAGNOSIS — L02411 Cutaneous abscess of right axilla: Secondary | ICD-10-CM | POA: Diagnosis present

## 2021-06-11 DIAGNOSIS — N1832 Chronic kidney disease, stage 3b: Secondary | ICD-10-CM | POA: Diagnosis not present

## 2021-06-11 DIAGNOSIS — I130 Hypertensive heart and chronic kidney disease with heart failure and stage 1 through stage 4 chronic kidney disease, or unspecified chronic kidney disease: Secondary | ICD-10-CM | POA: Insufficient documentation

## 2021-06-11 DIAGNOSIS — I2510 Atherosclerotic heart disease of native coronary artery without angina pectoris: Secondary | ICD-10-CM | POA: Insufficient documentation

## 2021-06-11 DIAGNOSIS — J449 Chronic obstructive pulmonary disease, unspecified: Secondary | ICD-10-CM | POA: Insufficient documentation

## 2021-06-11 DIAGNOSIS — J811 Chronic pulmonary edema: Secondary | ICD-10-CM | POA: Diagnosis not present

## 2021-06-11 DIAGNOSIS — Z85528 Personal history of other malignant neoplasm of kidney: Secondary | ICD-10-CM | POA: Diagnosis not present

## 2021-06-11 DIAGNOSIS — Z87891 Personal history of nicotine dependence: Secondary | ICD-10-CM | POA: Insufficient documentation

## 2021-06-11 DIAGNOSIS — R52 Pain, unspecified: Secondary | ICD-10-CM | POA: Diagnosis not present

## 2021-06-11 DIAGNOSIS — I517 Cardiomegaly: Secondary | ICD-10-CM | POA: Diagnosis not present

## 2021-06-11 LAB — CBC WITH DIFFERENTIAL/PLATELET
Abs Immature Granulocytes: 0.02 10*3/uL (ref 0.00–0.07)
Basophils Absolute: 0 10*3/uL (ref 0.0–0.1)
Basophils Relative: 1 %
Eosinophils Absolute: 0.1 10*3/uL (ref 0.0–0.5)
Eosinophils Relative: 3 %
HCT: 35.1 % — ABNORMAL LOW (ref 39.0–52.0)
Hemoglobin: 11 g/dL — ABNORMAL LOW (ref 13.0–17.0)
Immature Granulocytes: 1 %
Lymphocytes Relative: 25 %
Lymphs Abs: 0.9 10*3/uL (ref 0.7–4.0)
MCH: 26.1 pg (ref 26.0–34.0)
MCHC: 31.3 g/dL (ref 30.0–36.0)
MCV: 83.2 fL (ref 80.0–100.0)
Monocytes Absolute: 0.3 10*3/uL (ref 0.1–1.0)
Monocytes Relative: 7 %
Neutro Abs: 2.4 10*3/uL (ref 1.7–7.7)
Neutrophils Relative %: 63 %
Platelets: 200 10*3/uL (ref 150–400)
RBC: 4.22 MIL/uL (ref 4.22–5.81)
RDW: 14.7 % (ref 11.5–15.5)
WBC: 3.8 10*3/uL — ABNORMAL LOW (ref 4.0–10.5)
nRBC: 0 % (ref 0.0–0.2)

## 2021-06-11 LAB — BASIC METABOLIC PANEL
Anion gap: 7 (ref 5–15)
BUN: 29 mg/dL — ABNORMAL HIGH (ref 8–23)
CO2: 21 mmol/L — ABNORMAL LOW (ref 22–32)
Calcium: 9.2 mg/dL (ref 8.9–10.3)
Chloride: 109 mmol/L (ref 98–111)
Creatinine, Ser: 2.16 mg/dL — ABNORMAL HIGH (ref 0.61–1.24)
GFR, Estimated: 33 mL/min — ABNORMAL LOW (ref >60)
Glucose, Bld: 135 mg/dL — ABNORMAL HIGH (ref 70–99)
Potassium: 4.4 mmol/L (ref 3.5–5.1)
Sodium: 137 mmol/L (ref 135–145)

## 2021-06-11 LAB — TROPONIN I (HIGH SENSITIVITY): Troponin I (High Sensitivity): 12 ng/L (ref <18)

## 2021-06-11 MED ORDER — LIDOCAINE-EPINEPHRINE 1 %-1:100000 IJ SOLN
10.0000 mL | Freq: Once | INTRAMUSCULAR | Status: AC
  Administered 2021-06-11: 15:00:00 10 mL
  Filled 2021-06-11: qty 1

## 2021-06-11 MED ORDER — DOXYCYCLINE MONOHYDRATE 100 MG PO TABS: 100.0000 mg | ORAL_TABLET | Freq: Two times a day (BID) | ORAL | 0 refills | Status: AC

## 2021-06-11 MED ORDER — DOXYCYCLINE HYCLATE 100 MG PO TABS
100.0000 mg | ORAL_TABLET | Freq: Once | ORAL | Status: AC
  Administered 2021-06-11: 15:00:00 100 mg via ORAL
  Filled 2021-06-11: qty 1

## 2021-06-11 NOTE — ED Notes (Signed)
Pt reluctant to leave room even though discharged.  Pt states his niece that we had left a message to pick him up  is working in [place] stated he doesn't have another way home.

## 2021-06-11 NOTE — ED Triage Notes (Signed)
Pt c/o abscess in R axilla x "a couple months."  Pain score 10/10.  Pt reports he has had it drained x 3 times and completed a course of antibiotics each time.  C/o chills and blood shot eyes x 3-4 days.  Abscess noted in axilla.  No drainage noted.

## 2021-06-11 NOTE — ED Provider Notes (Signed)
Emergency Medicine Provider Triage Evaluation Note  Larry Callahan , a 65 y.o. male  was evaluated in triage.  Pt complains of right axillary abscess. Has been drained 3 times in MD office. Has finished antibiotics as well. Chills x 3-4 days.  Review of Systems  Positive: Abscess right axilla Negative: Vomiting  Physical Exam  There were no vitals taken for this visit. Gen:   Awake, no distress   Resp:  Normal effort  MSK:   Moves extremities without difficulty  Other:    Medical Decision Making  Medically screening exam initiated at 11:49 AM.  Appropriate orders placed.  Baltazar Pekala Livers was informed that the remainder of the evaluation will be completed by another provider, this initial triage assessment does not replace that evaluation, and the importance of remaining in the ED until their evaluation is complete.    Victorino Dike, FNP 06/11/21 1151    Harvest Dark, MD 06/11/21 1204

## 2021-06-11 NOTE — ED Notes (Signed)
See triage note  presents with possible abscess area under right arm  states he has had area lanced by PCP

## 2021-06-11 NOTE — ED Notes (Signed)
Discharge instructions reviewed with patient. RN attempted to call patients niece for ride home 3 times with no answer.

## 2021-06-11 NOTE — Discharge Instructions (Signed)
Please start taking the doxycycline twice a day for your hidradenitis.  Please follow-up with your primary care provider.  You should ideally follow-up with Dr. Nehemiah Massed with dermatology for further management.  Your EKG and cardiac enzymes are reassuring.  If you have any new or worsening chest pain, please return to the emergency department.

## 2021-06-11 NOTE — ED Provider Notes (Signed)
[facility]  ____________________________________________   Event Date/Time   First MD Initiated Contact with Patient 06/11/21 1252     (approximate)  I have reviewed the triage vital signs and the nursing notes.   HISTORY  Chief Complaint Abscess    HPI Thai Burgueno Schaafsma is a 65 y.o. male with past medical history of CKD, COPD, coronary disease, nonischemic cardiomyopathy, hidradenitis suppurativa who presents with pain and swelling in the right axilla.  Patient tells me that he has had ongoing issues with a wound in this area and has seen Dr. Nehemiah Massed with dermatology for this.  He has had it opened up several times in the office and has been on several courses of antibiotics.  He is not sure whether he is taking antibiotic currently.  Is using clindamycin gel.  Last time he saw his primary care provider was about 3 weeks ago and is noted that he was referred to dermatology.  Has not seen Dr. Nehemiah Massed since May of this year.  From record review looks like he had a epidermal cyst removed in the right axilla and then has since had issues with wound healing in that area secondary to the hidradenitis.  I do not he is on any antibiotics currently.  Patient does endorse chills over the last 4 days.  Pain from the right axilla radiates into the right chest but he does not have any exertional symptoms, denies shortness of breath.         Past Medical History:  Diagnosis Date   Allergy    Chronic combined systolic (congestive) and diastolic (congestive) heart failure ([place])    a. 04/2019 Echo: EF 45-50%; b. 02/2019 Echo: EF 55-60%; c. 09/2020 Echo: EF 35-40%, glob HK. Nl RV size/fxn. Mild MR.   CKD (chronic kidney disease), stage III (HCC)    COPD (chronic obstructive pulmonary disease) ([place])    Coronary artery disease    a. 2011 Cath: nonobs dzs; b. 09/2020 MV: EF 38%. No signif ischemia. ? inf MI vs diaph attenuation. GI uptake artifact.   Diabetes mellitus  without complication ([place])    GERD (gastroesophageal reflux disease)    Hyperlipidemia    Hypertension    Morbid obesity ([place])    NICM (nonischemic cardiomyopathy) ([place])    a. 04/2019 Echo: EF 45-50%; b. 02/2019 Echo: EF 55-60%; c. 09/2020 Echo: EF 35-40%, glob HK; d. 09/2020 MV: No ischemia. ? inf infarct vs attenuation.   OSA (obstructive sleep apnea)     Patient Active Problem List   Diagnosis Date Noted   PAD (peripheral artery disease) ([place])    Cellulitis of right arm 03/02/2021   Chronic anticoagulation 03/02/2021   COPD with acute exacerbation ([place]) 01/29/2021   Atrial flutter ([place]) 01/07/2021   Syncope and collapse 11/16/2020   Demand ischemia ([place])    Influenza A 10/30/2020   HLD (hyperlipidemia) 10/06/2020   History of CVA (cerebrovascular accident) 09/25/2020   Gastroesophageal reflux disease without esophagitis 04/19/2020   Pneumonia due to COVID-19 virus 01/30/2020   Elevated troponin I level 01/29/2020   Suspected COVID-19 virus infection 01/29/2020   Lightheadedness 01/29/2020   Hematemesis 01/09/2020   Hospital discharge follow-up 11/26/2019   Generalized weakness 08/08/2019   Obesity (BMI 30-39.9) 08/08/2019   Acute right hip pain 06/17/2019   Inability to ambulate due to hip 06/17/2019   CKD stage 3 due to type 2 diabetes mellitus (HCC)    Hypervolemia    Type 2 diabetes mellitus with stage 3b  chronic kidney disease, with long-term current use of insulin ([place])    Hypomagnesemia    Full code status 05/20/2019   Pleuritic chest pain    Multifocal pneumonia 05/19/2019   Acute kidney injury superimposed on CKD 3b ([place]) 05/09/2019   Hemoptysis 04/09/2019   Shortness of breath 03/26/2019   Uncontrolled type 2 diabetes mellitus with insulin therapy 03/12/2019   Acute on chronic heart failure with preserved ejection fraction (HFpEF) (HCC)    Stage 3b chronic kidney disease ([place])    Chronic systolic CHF (congestive heart failure) ([place]) 55/37/4827   Diastolic  dysfunction 07/86/7544   Iron deficiency anemia 12/31/2018   Atopic dermatitis 11/24/2018   Screening for colon cancer 11/06/2017   Uncontrolled type 2 diabetes mellitus with hyperglycemia ([place]) 11/06/2017   Hidradenitis 06/09/2017   Atherosclerotic heart disease of native coronary artery without angina pectoris 06/09/2017   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   Obstructive sleep apnea, adult 06/09/2017   Morbid (severe) obesity due to excess calories ([place]) 06/09/2017   Cigarette nicotine dependence with nicotine-induced disorder 06/09/2017   Diabetic polyneuropathy associated with type 2 diabetes mellitus ([place]) 06/09/2017   Allergic rhinitis due to pollen 06/09/2017   Mixed hyperlipidemia 06/09/2017   COPD (chronic obstructive pulmonary disease) ([place]) 06/09/2017   Dyspnea on exertion 06/09/2017   Wheezing 06/09/2017   Dysuria 06/09/2017   Snoring 06/09/2017   Essential hypertension 06/09/2017   Personal history of other malignant neoplasm of kidney 06/09/2017   Pain in right hip 06/09/2017   Impacted cerumen, bilateral 06/09/2017   Secondary hyperparathyroidism, not elsewhere classified ([place]) 06/09/2017   Type 2 diabetes mellitus with hyperglycemia ([place]) 06/09/2017   Tinea corporis 06/09/2017    Past Surgical History:  Procedure Laterality Date   BACK SURGERY     CARDIAC CATHETERIZATION     CHOLECYSTECTOMY     COLONOSCOPY WITH PROPOFOL N/A 04/12/2019   Procedure: COLONOSCOPY WITH PROPOFOL;  Surgeon: [name], MD;  Location: [facility] ENDOSCOPY;  Service: Gastroenterology;  Laterality: N/A;   ESOPHAGOGASTRODUODENOSCOPY N/A 04/12/2019   Procedure: ESOPHAGOGASTRODUODENOSCOPY (EGD);  Surgeon: [name], MD;  Location: [facility] ENDOSCOPY;  Service: Gastroenterology;  Laterality: N/A;   ESOPHAGOGASTRODUODENOSCOPY (EGD) WITH PROPOFOL N/A 11/21/2019   Procedure: ESOPHAGOGASTRODUODENOSCOPY (EGD) WITH PROPOFOL;  Surgeon: [name],  MD;  Location: [facility] ENDOSCOPY;  Service: Gastroenterology;  Laterality: N/A;   FLEXIBLE BRONCHOSCOPY Bilateral 05/17/2019   Procedure: FLEXIBLE BRONCHOSCOPY;  Surgeon: [name], MD;  Location: [name] ORS;  Service: Pulmonary;  Laterality: Bilateral;   left arm surgery     nephrectomy Left    PARATHYROIDECTOMY     RIGHT HEART CATH N/A 03/29/2019   Procedure: RIGHT HEART CATH;  Surgeon: [name], MD;  Location: [facility] LAB;  Service: Cardiovascular;  Laterality: N/A;    Prior to Admission medications   Medication Sig Start Date End Date Taking? Authorizing Provider  albuterol (VENTOLIN HFA) 108 (90 Base) MCG/ACT inhaler Inhale 2 puffs into the lungs every 6 (six) hours as needed for wheezing or shortness of breath. 08/31/20   Lavera Guise, MD  amLODipine (NORVASC) 10 MG tablet Take 1 tablet (10 mg total) by mouth daily. 03/23/21   [name], NP  apixaban (ELIQUIS) 2.5 MG TABS tablet Take 1 tablet (2.5 mg total) by mouth 2 (two) times daily. 05/20/21   [name], NP  Blood Glucose Calibration (TRUE METRIX LEVEL 1) Low SOLN Use as directed 04/04/18   [name], NP  Blood Glucose Monitoring Suppl (TRUE METRIX AIR GLUCOSE METER) DEVI 1 Device by Does not apply route 2 (two) times daily. 04/13/18   [name], NP  Budeson-Glycopyrrol-Formoterol (BREZTRI AEROSPHERE) 160-9-4.8 MCG/ACT AERO Inhale 2 puffs into the lungs in the morning and at bedtime. 02/23/21   [name], NP  carvedilol (COREG) 12.5 MG tablet Take 1 tablet (12.5 mg total) by mouth 2 (two) times daily. 01/07/21 01/07/22  Arta Silence, MD  cetirizine (ZYRTEC) 10 MG tablet Take 10 mg by mouth daily.    [provider]  clindamycin (CLINDAGEL) 1 % gel Apply to the affected area two times daily until resolved. Then use as needed for flare ups 05/20/21   [name], NP  escitalopram (LEXAPRO) 10 MG tablet Take one tab po qd for anxiety with food 02/23/21   [name], NP  furosemide (LASIX) 40 MG tablet Take one to tabs once or twice days for fluid // swelling 02/23/21   [name], NP  gabapentin (NEURONTIN) 100 MG capsule Take 1 capsule (100 mg total) by mouth in the morning, at noon, and at bedtime. 02/23/21   [name], NP  glimepiride (AMARYL) 2 MG tablet Take one tab po qd with supper 02/23/21   [name], NP  glucose blood (TRUE METRIX BLOOD GLUCOSE TEST) test strip Use as instructed twice daily diag E11.65 07/30/19   [name], NP  hydrALAZINE (APRESOLINE) 100 MG tablet Take 1 tablet (100 mg total) by mouth 3 (three) times daily. 02/23/21   [name], NP  insulin glargine, 1 Unit Dial, (TOUJEO) 300 UNIT/ML Solostar Pen Inject 10 Units into the skin daily. Reduced from prior 26 units daily. 03/07/21   Enzo Bi, MD  ipratropium-albuterol (DUONEB) 0.5-2.5 (3) MG/3ML SOLN Take 3 mLs by nebulization every 6 (six) hours as needed. 10/11/20   Lavera Guise, MD  isosorbide mononitrate (IMDUR) 30 MG 24 hr tablet Take 1 tablet (30 mg total) by mouth 2 (two) times daily. 10/08/20   Jennye Boroughs, MD  meclizine (ANTIVERT) 25 MG tablet Take 1 tablet (25 mg total) by mouth 3 (three) times daily as needed for dizziness or nausea. 11/25/20   Paulette Blanch, MD  NOVOFINE PEN NEEDLE 32G X 6 MM MISC Use as directed 10/15/20   Lavera Guise, MD  omeprazole (PRILOSEC) 40 MG capsule Take 1 capsule (40 mg total) by mouth in the morning and at bedtime. 05/12/21   [name], NP  potassium chloride SA (KLOR-CON) 20 MEQ tablet Take 1 tablet (20 mEq total) by mouth 2 (two) times daily as needed. When you take Lasix. 03/07/21   Enzo Bi, MD  rosuvastatin (CRESTOR) 10 MG tablet Take 1 tablet (10 mg total) by mouth at bedtime. 02/23/21   [name], NP  TRUEplus Lancets 33G MISC Use as directed twice daily diag E11.65 07/30/19   [name], NP  vitamin B-12 1000 MCG tablet Take 1 tablet (1,000 mcg total) by mouth daily. 01/16/21    Fritzi Mandes, MD    Allergies Penicillins  Family History  Problem Relation Age of Onset   Diabetes Mother        2003   Lung cancer Father    Diabetes Sister    Hypertension Sister    Heart disease Sister    Cancer Sister    Bone cancer Brother     Social History Social History   Tobacco Use   Smoking status: Former    Types: Cigarettes  Smokeless tobacco: Former    Types: Nurse, children's Use: Never used  Substance Use Topics   Alcohol use: No   Drug use: No    Review of Systems   Review of Systems  Constitutional:  Positive for chills. Negative for fever.  Respiratory:  Negative for shortness of breath.   Cardiovascular:  Positive for chest pain.  Skin:  Positive for wound.  All other systems reviewed and are negative.  Physical Exam Updated Vital Signs BP (!) 141/74 (BP Location: Left Arm)    Pulse 60    Temp 98.2 F (36.8 C) (Oral)    Resp 18    Ht _0  (1.803 m)    Wt 127 kg    SpO2 96%    BMI 39.05 kg/m   Physical Exam Vitals and nursing note reviewed.  Constitutional:      General: He is not in acute distress.    Appearance: Normal appearance.  HENT:     Head: Normocephalic and atraumatic.  Eyes:     General: No scleral icterus.    Conjunctiva/sclera: Conjunctivae normal.  Pulmonary:     Effort: Pulmonary effort is normal. No respiratory distress.     Breath sounds: Normal breath sounds. No wheezing.  Musculoskeletal:        General: No deformity or signs of injury.     Cervical back: Normal range of motion.  Skin:    Coloration: Skin is not jaundiced or pale.     Comments: Sequela of hidradenitis evident in the right axilla there is an area of erythema and superficial fluctuance, no spontaneous drainage, no surrounding cellulitis  Neurological:     General: No focal deficit present.     Mental Status: He is alert and oriented to person, place, and time. Mental status is at baseline.  Psychiatric:        Mood and Affect: Mood  normal.        Behavior: Behavior normal.     LABS (all labs ordered are listed, but only abnormal results are displayed)  Labs Reviewed  CBC WITH DIFFERENTIAL/PLATELET - Abnormal; Notable for the following components:      Result Value   WBC 3.8 (*)    Hemoglobin 11.0 (*)    HCT 35.1 (*)    All other components within normal limits  BASIC METABOLIC PANEL - Abnormal; Notable for the following components:   CO2 21 (*)    Glucose, Bld 135 (*)    BUN 29 (*)    Creatinine, Ser 2.16 (*)    GFR, Estimated 33 (*)    All other components within normal limits  TROPONIN I (HIGH SENSITIVITY)   ____________________________________________  EKG  Atrial fibrillation, rate controlled, normal axis, no acute ischemic changes ____________________________________________  RADIOLOGY Almeta Monas, personally viewed and evaluated these images (plain radiographs) as part of my medical decision making, as well as reviewing the written report by the radiologist.  ED MD interpretation: Viewed the chest x-ray which shows cardiomegaly and pulmonary vascular ejection is unchanged from prior, agree with radiology report    ____________________________________________   PROCEDURES  Procedure(s) performed (including Critical Care):  Marland KitchenMarland KitchenIncision and Drainage  Date/Time: 06/11/2021 2:47 PM Performed by: Rada Hay, MD Authorized by: Rada Hay, MD   Consent:    Consent obtained:  Verbal Location:    Type:  Abscess   Location:  Upper extremity   Upper extremity location:  Arm Pre-procedure details:  Skin preparation:  Povidone-iodine Sedation:    Sedation type:  None Anesthesia:    Anesthesia method:  Local infiltration   Local anesthetic:  Lidocaine 1% WITH epi Procedure type:    Complexity:  Simple Procedure details:    Ultrasound guidance: no     Needle aspiration: no     Incision types:  Stab incision   Incision depth:  Dermal   Drainage:  Bloody    Drainage amount:  Scant   Wound treatment:  Wound left open Post-procedure details:    Procedure completion:  Tolerated   ____________________________________________   INITIAL IMPRESSION / ASSESSMENT AND PLAN / ED COURSE  Patient is a 65 year old male with hidradenitis and recurrent wound under the right axilla who presents with swelling and pain in the right axilla.  Patient is a difficult historian and has trouble telling me exactly when his symptoms started or if he is on any antibiotics.  I can see that he has had this area drained by dermatology several times and seems to have started after he had an epidermal inclusion cyst removed in that area.  Does not appear that he is on an antibiotic something clindamycin topically.  There is an area of fluctuance with minimal surrounding cellulitis.  I&D was attempted with only scant drainage of blood but no purulence.  We will start him on doxycycline.  W patient also complaining of pain radiating from the area in the right axilla around to the chest so obtain EKG chest x-ray and cardiac enzymes which are all reassuring.  His pain is most likely secondary to the hidradenitis low suspicion for acute coronary syndrome.  His chest x-ray does show chronic pulmonary vascular congestion but he otherwise does not appear decompensated, no lower extremity edema and normal pulse ox on room air.  We will start him on doxycycline.  Advised that he follow-up with his primary care provider and dermatologist. ____________________________________________   FINAL CLINICAL IMPRESSION(S) / ED DIAGNOSES  Final diagnoses:  Hydradenitis     ED Discharge Orders     None        Note:  This document was prepared using Dragon voice recognition software and may include unintentional dictation errors.    Rada Hay, MD 06/11/21 [id number]

## 2021-06-22 ENCOUNTER — Ambulatory Visit: Payer: Medicare PPO | Admitting: Nurse Practitioner

## 2021-06-22 DIAGNOSIS — Z0289 Encounter for other administrative examinations: Secondary | ICD-10-CM

## 2021-06-22 IMAGING — CR DG CHEST 2V
2 series · 2 of 2 positions shown · non-contrast
Comparison: Chest radiograph 08/08/2019, 08/04/2019

CLINICAL DATA: Pt to room in wheelchair, states he found a tick on
his left leg at 3 pm, removed it and the site was bleeding. He
reports a weak right leg, was able to stand to remove pants for
examination. Small sore noted on left thigh.

EXAM:
CHEST - 2 VIEW

[chest pa]
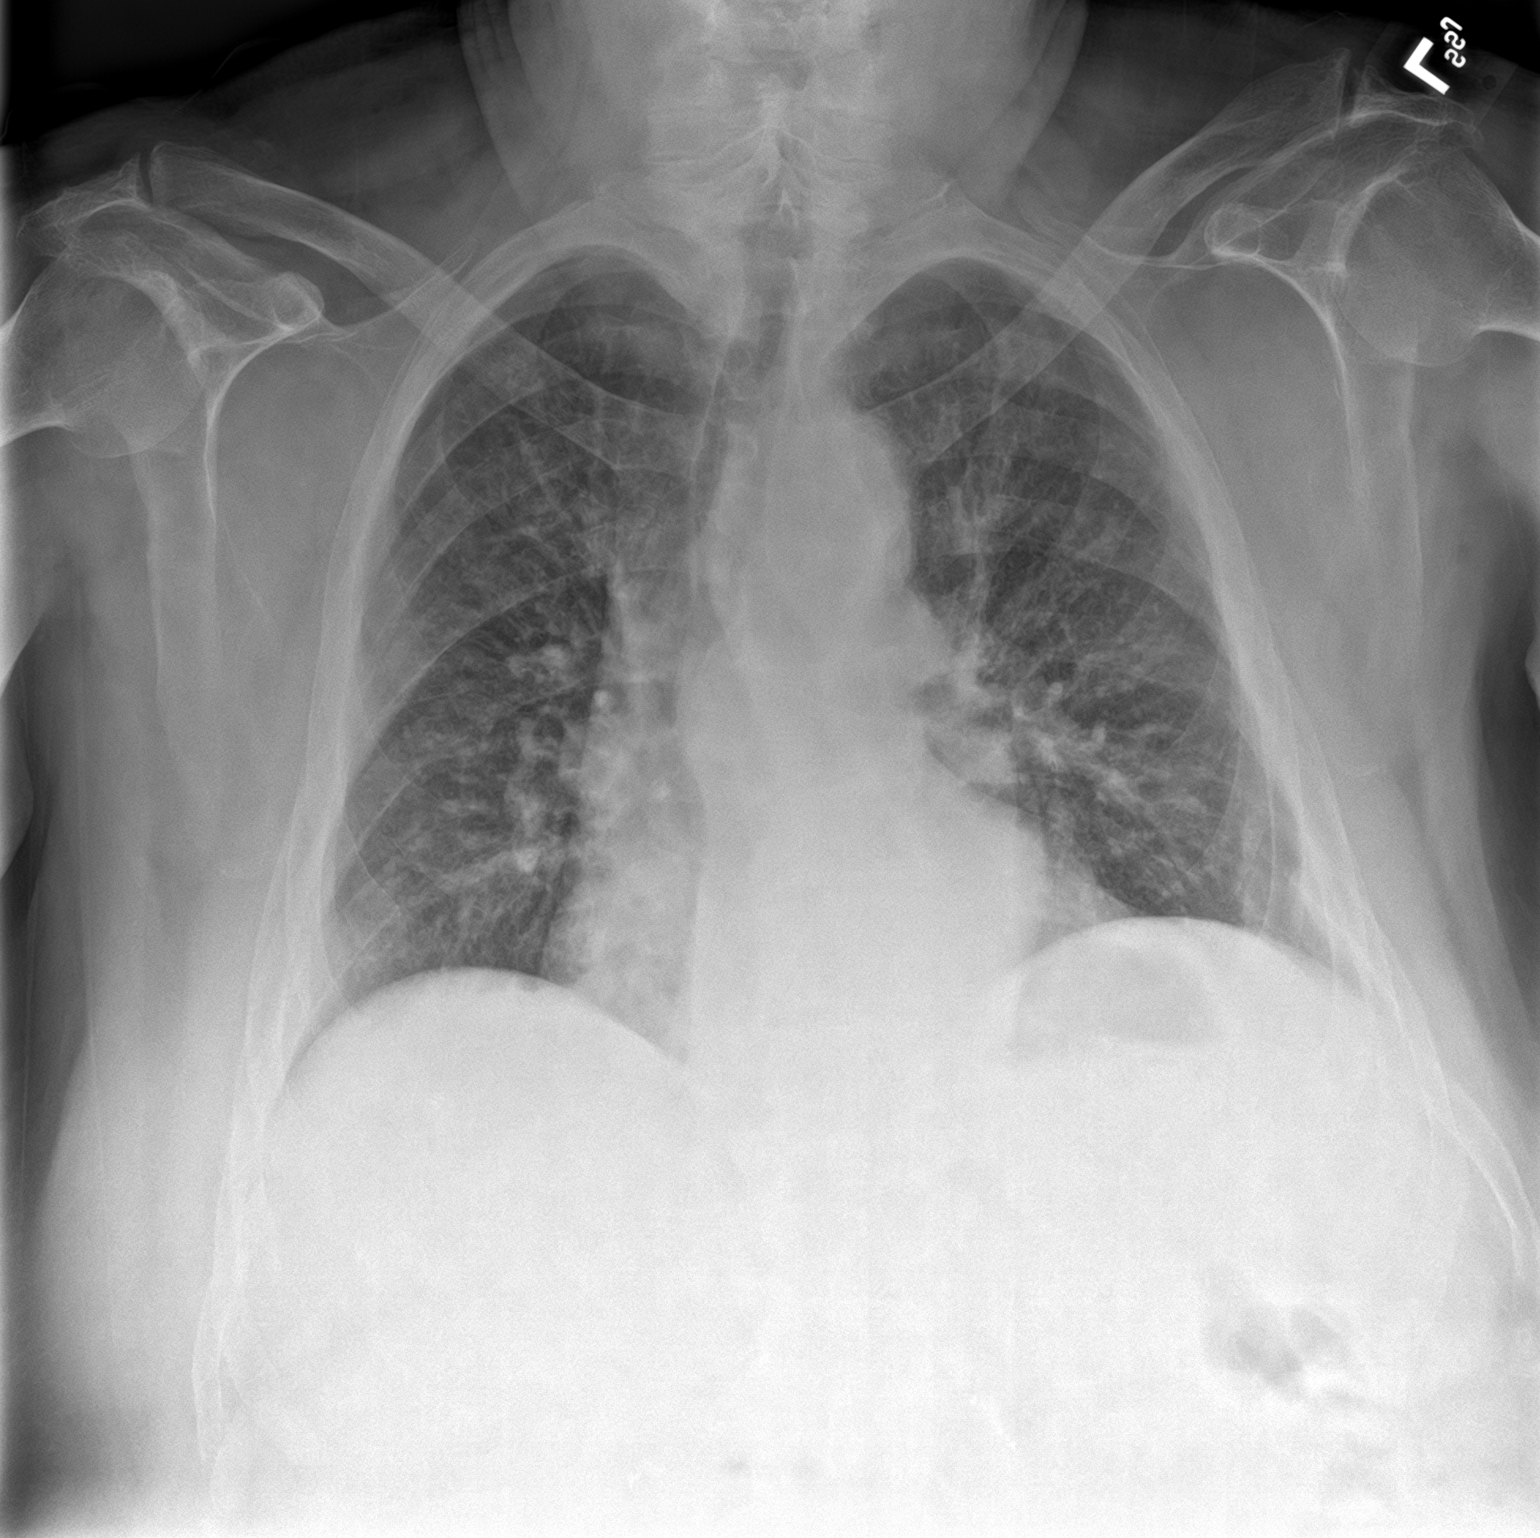

[chest lat]
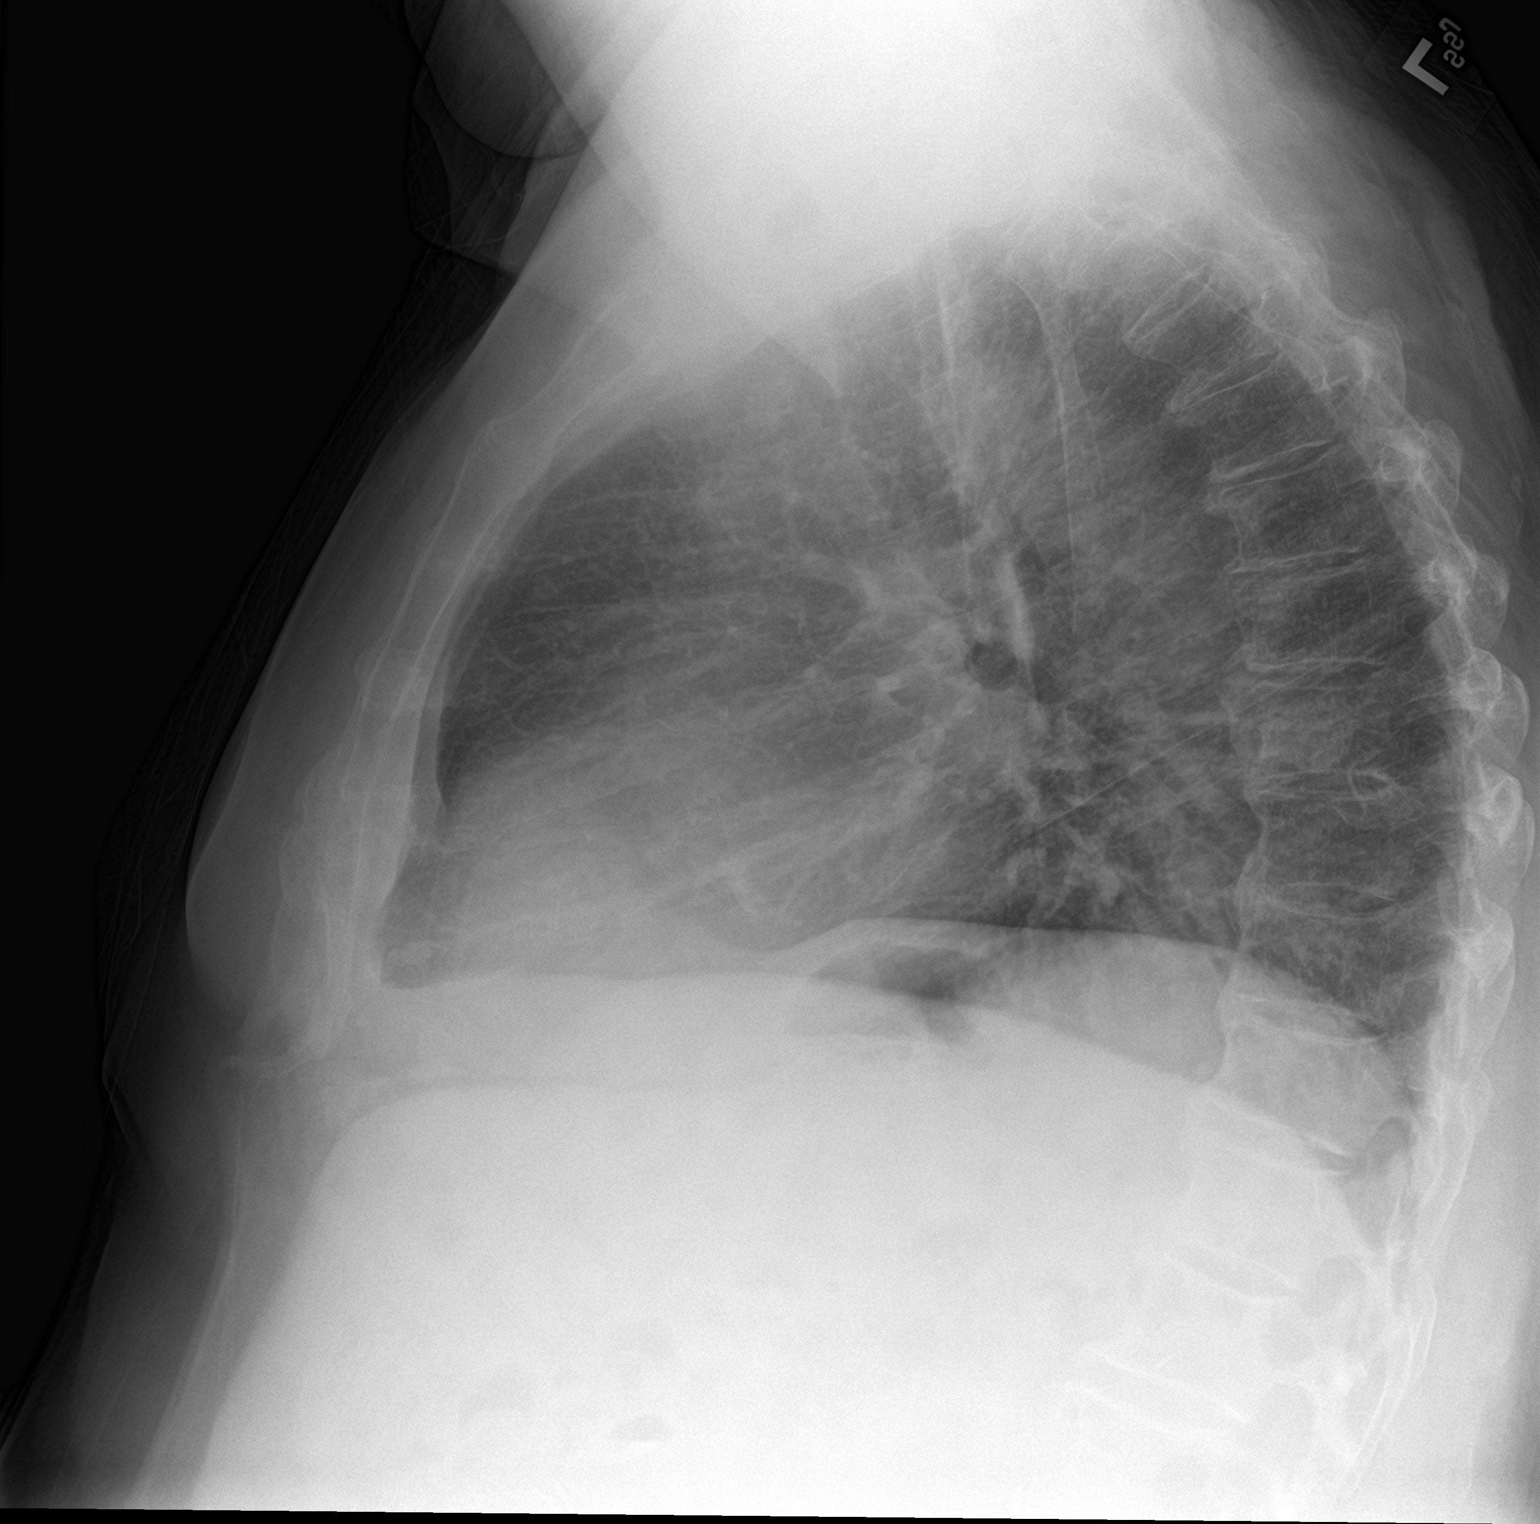

[2 of 2 positions shown; findings below may reference images not displayed]

FINDINGS: The heart size and mediastinal contours are within normal limits.
Chronic coarse bilateral interstitial markings. No new focal
consolidation. No pneumothorax or pleural effusion. No acute finding
in the visualized skeleton.
IMPRESSION: No acute cardiopulmonary findings.

## 2021-06-23 ENCOUNTER — Telehealth: Payer: Self-pay

## 2021-06-23 NOTE — Telephone Encounter (Signed)
Left voicemail for patient to reschedule missed appointment on 06-23-21.

## 2021-06-27 ENCOUNTER — Other Ambulatory Visit: Payer: Self-pay

## 2021-06-27 ENCOUNTER — Emergency Department: Payer: Medicare PPO

## 2021-06-27 ENCOUNTER — Encounter: Payer: Self-pay | Admitting: Emergency Medicine

## 2021-06-27 ENCOUNTER — Inpatient Hospital Stay
Admission: EM | Admit: 2021-06-27 | Discharge: 2021-06-29 | DRG: 190 | Disposition: A | Discharge status: Home Health | Payer: Medicare PPO | Attending: Family Medicine | Admitting: Family Medicine

## 2021-06-27 DIAGNOSIS — Z833 Family history of diabetes mellitus: Secondary | ICD-10-CM | POA: Diagnosis not present

## 2021-06-27 DIAGNOSIS — D631 Anemia in chronic kidney disease: Secondary | ICD-10-CM | POA: Diagnosis present

## 2021-06-27 DIAGNOSIS — I130 Hypertensive heart and chronic kidney disease with heart failure and stage 1 through stage 4 chronic kidney disease, or unspecified chronic kidney disease: Secondary | ICD-10-CM | POA: Diagnosis present

## 2021-06-27 DIAGNOSIS — I2510 Atherosclerotic heart disease of native coronary artery without angina pectoris: Secondary | ICD-10-CM | POA: Diagnosis present

## 2021-06-27 DIAGNOSIS — I5022 Chronic systolic (congestive) heart failure: Secondary | ICD-10-CM | POA: Diagnosis present

## 2021-06-27 DIAGNOSIS — Z8249 Family history of ischemic heart disease and other diseases of the circulatory system: Secondary | ICD-10-CM | POA: Diagnosis not present

## 2021-06-27 DIAGNOSIS — R079 Chest pain, unspecified: Secondary | ICD-10-CM | POA: Diagnosis not present

## 2021-06-27 DIAGNOSIS — Z6837 Body mass index (BMI) 37.0-37.9, adult: Secondary | ICD-10-CM | POA: Diagnosis not present

## 2021-06-27 DIAGNOSIS — E119 Type 2 diabetes mellitus without complications: Secondary | ICD-10-CM | POA: Diagnosis present

## 2021-06-27 DIAGNOSIS — N184 Chronic kidney disease, stage 4 (severe): Secondary | ICD-10-CM | POA: Diagnosis present

## 2021-06-27 DIAGNOSIS — J209 Acute bronchitis, unspecified: Secondary | ICD-10-CM | POA: Diagnosis not present

## 2021-06-27 DIAGNOSIS — I10 Essential (primary) hypertension: Secondary | ICD-10-CM | POA: Diagnosis present

## 2021-06-27 DIAGNOSIS — E785 Hyperlipidemia, unspecified: Secondary | ICD-10-CM | POA: Diagnosis present

## 2021-06-27 DIAGNOSIS — E1151 Type 2 diabetes mellitus with diabetic peripheral angiopathy without gangrene: Secondary | ICD-10-CM | POA: Diagnosis present

## 2021-06-27 DIAGNOSIS — G4733 Obstructive sleep apnea (adult) (pediatric): Secondary | ICD-10-CM | POA: Diagnosis present

## 2021-06-27 DIAGNOSIS — J9601 Acute respiratory failure with hypoxia: Secondary | ICD-10-CM | POA: Diagnosis present

## 2021-06-27 DIAGNOSIS — D6859 Other primary thrombophilia: Secondary | ICD-10-CM | POA: Diagnosis present

## 2021-06-27 DIAGNOSIS — Z20822 Contact with and (suspected) exposure to covid-19: Secondary | ICD-10-CM | POA: Diagnosis present

## 2021-06-27 DIAGNOSIS — J441 Chronic obstructive pulmonary disease with (acute) exacerbation: Principal | ICD-10-CM | POA: Diagnosis present

## 2021-06-27 DIAGNOSIS — R7989 Other specified abnormal findings of blood chemistry: Secondary | ICD-10-CM | POA: Diagnosis present

## 2021-06-27 DIAGNOSIS — R112 Nausea with vomiting, unspecified: Secondary | ICD-10-CM | POA: Diagnosis not present

## 2021-06-27 DIAGNOSIS — I4892 Unspecified atrial flutter: Secondary | ICD-10-CM | POA: Diagnosis present

## 2021-06-27 DIAGNOSIS — I739 Peripheral vascular disease, unspecified: Secondary | ICD-10-CM | POA: Diagnosis present

## 2021-06-27 DIAGNOSIS — L732 Hidradenitis suppurativa: Secondary | ICD-10-CM | POA: Diagnosis present

## 2021-06-27 DIAGNOSIS — Z7901 Long term (current) use of anticoagulants: Secondary | ICD-10-CM | POA: Diagnosis not present

## 2021-06-27 DIAGNOSIS — R110 Nausea: Secondary | ICD-10-CM | POA: Diagnosis not present

## 2021-06-27 DIAGNOSIS — K219 Gastro-esophageal reflux disease without esophagitis: Secondary | ICD-10-CM | POA: Diagnosis present

## 2021-06-27 DIAGNOSIS — R0789 Other chest pain: Secondary | ICD-10-CM | POA: Diagnosis not present

## 2021-06-27 DIAGNOSIS — D6869 Other thrombophilia: Secondary | ICD-10-CM | POA: Diagnosis present

## 2021-06-27 DIAGNOSIS — Z801 Family history of malignant neoplasm of trachea, bronchus and lung: Secondary | ICD-10-CM | POA: Diagnosis not present

## 2021-06-27 DIAGNOSIS — E1122 Type 2 diabetes mellitus with diabetic chronic kidney disease: Secondary | ICD-10-CM | POA: Diagnosis present

## 2021-06-27 DIAGNOSIS — R1111 Vomiting without nausea: Secondary | ICD-10-CM | POA: Diagnosis not present

## 2021-06-27 DIAGNOSIS — I5042 Chronic combined systolic (congestive) and diastolic (congestive) heart failure: Secondary | ICD-10-CM | POA: Diagnosis present

## 2021-06-27 DIAGNOSIS — E1165 Type 2 diabetes mellitus with hyperglycemia: Secondary | ICD-10-CM | POA: Diagnosis present

## 2021-06-27 DIAGNOSIS — J449 Chronic obstructive pulmonary disease, unspecified: Secondary | ICD-10-CM | POA: Diagnosis present

## 2021-06-27 DIAGNOSIS — I517 Cardiomegaly: Secondary | ICD-10-CM | POA: Diagnosis not present

## 2021-06-27 DIAGNOSIS — I4430 Unspecified atrioventricular block: Secondary | ICD-10-CM | POA: Diagnosis present

## 2021-06-27 DIAGNOSIS — R791 Abnormal coagulation profile: Secondary | ICD-10-CM | POA: Diagnosis not present

## 2021-06-27 DIAGNOSIS — Z87891 Personal history of nicotine dependence: Secondary | ICD-10-CM

## 2021-06-27 DIAGNOSIS — J440 Chronic obstructive pulmonary disease with acute lower respiratory infection: Secondary | ICD-10-CM | POA: Diagnosis not present

## 2021-06-27 LAB — BASIC METABOLIC PANEL
Anion gap: 9 (ref 5–15)
BUN: 51 mg/dL — ABNORMAL HIGH (ref 8–23)
CO2: 20 mmol/L — ABNORMAL LOW (ref 22–32)
Calcium: 9.1 mg/dL (ref 8.9–10.3)
Chloride: 108 mmol/L (ref 98–111)
Creatinine, Ser: 2.38 mg/dL — ABNORMAL HIGH (ref 0.61–1.24)
GFR, Estimated: 29 mL/min — ABNORMAL LOW (ref >60)
Glucose, Bld: 158 mg/dL — ABNORMAL HIGH (ref 70–99)
Potassium: 3.8 mmol/L (ref 3.5–5.1)
Sodium: 137 mmol/L (ref 135–145)

## 2021-06-27 LAB — CBC
HCT: 36.7 % — ABNORMAL LOW (ref 39.0–52.0)
Hemoglobin: 11.6 g/dL — ABNORMAL LOW (ref 13.0–17.0)
MCH: 25.7 pg — ABNORMAL LOW (ref 26.0–34.0)
MCHC: 31.6 g/dL (ref 30.0–36.0)
MCV: 81.4 fL (ref 80.0–100.0)
Platelets: 203 10*3/uL (ref 150–400)
RBC: 4.51 MIL/uL (ref 4.22–5.81)
RDW: 15.3 % (ref 11.5–15.5)
WBC: 5.6 10*3/uL (ref 4.0–10.5)
nRBC: 0 % (ref 0.0–0.2)

## 2021-06-27 LAB — TROPONIN I (HIGH SENSITIVITY)
Troponin I (High Sensitivity): 17 ng/L (ref <18)
Troponin I (High Sensitivity): 18 ng/L — ABNORMAL HIGH (ref <18)

## 2021-06-27 MED ORDER — IPRATROPIUM-ALBUTEROL 0.5-2.5 (3) MG/3ML IN SOLN
3.0000 mL | Freq: Once | RESPIRATORY_TRACT | Status: AC
  Administered 2021-06-28: 3 mL via RESPIRATORY_TRACT
  Filled 2021-06-27: qty 3

## 2021-06-27 MED ORDER — FENTANYL CITRATE PF 50 MCG/ML IJ SOSY
50.0000 ug | PREFILLED_SYRINGE | Freq: Once | INTRAMUSCULAR | Status: AC
  Administered 2021-06-27: 50 ug via INTRAVENOUS
  Filled 2021-06-27: qty 1

## 2021-06-27 MED ORDER — ALBUTEROL SULFATE HFA 108 (90 BASE) MCG/ACT IN AERS: 2.0000 | INHALATION_SPRAY | RESPIRATORY_TRACT | Status: DC | PRN

## 2021-06-27 MED ORDER — ONDANSETRON HCL 4 MG/2ML IJ SOLN
4.0000 mg | Freq: Once | INTRAMUSCULAR | Status: AC
Start: 2021-06-27 — End: 2021-06-27
  Administered 2021-06-27: 4 mg via INTRAVENOUS
  Filled 2021-06-27: qty 2

## 2021-06-27 MED ORDER — METHYLPREDNISOLONE SODIUM SUCC 125 MG IJ SOLR
125.0000 mg | Freq: Once | INTRAMUSCULAR | Status: AC
  Administered 2021-06-27: 125 mg via INTRAVENOUS
  Filled 2021-06-27: qty 2

## 2021-06-27 NOTE — ED Triage Notes (Signed)
Pt via EMS from home. Pt c/o non-radiating L sided CP that started this AM. Also endorses SOB, NV. Denies fevers. Denies cardiac hx other than CHF. Also has COPD. Pt is A&OX4 and NAD

## 2021-06-27 NOTE — ED Provider Notes (Signed)
[facility] Provider Note    Event Date/Time   First MD Initiated Contact with Patient 06/27/21 2326     (approximate)   History   Chest Pain   HPI  Larry Callahan is a 66 y.o. male with history of CHF, COPD, CKD, hypertension, hyperlipidemia, obesity who presents to the emergency department with complaints of not feeling well for the past day.  Reports chills but no fever.  Has had productive cough and wheezing.  Is feeling short of breath.  Having right-sided sharp chest pain worse with deep inspiration.  Also having nausea and vomiting but no diarrhea, urinary symptoms or abdominal pain.  Does not wear oxygen at home.  He is worried that he has COVID-19.   History provided by patient.      Past Medical History:  Diagnosis Date   Allergy    Chronic combined systolic (congestive) and diastolic (congestive) heart failure ([place])    a. 04/2019 Echo: EF 45-50%; b. 02/2019 Echo: EF 55-60%; c. 09/2020 Echo: EF 35-40%, glob HK. Nl RV size/fxn. Mild MR.   CKD (chronic kidney disease), stage III (HCC)    COPD (chronic obstructive pulmonary disease) ([place])    Coronary artery disease    a. 2011 Cath: nonobs dzs; b. 09/2020 MV: EF 38%. No signif ischemia. ? inf MI vs diaph attenuation. GI uptake artifact.   Diabetes mellitus without complication ([place])    GERD (gastroesophageal reflux disease)    Hyperlipidemia    Hypertension    Morbid obesity ([place])    NICM (nonischemic cardiomyopathy) ([place])    a. 04/2019 Echo: EF 45-50%; b. 02/2019 Echo: EF 55-60%; c. 09/2020 Echo: EF 35-40%, glob HK; d. 09/2020 MV: No ischemia. ? inf infarct vs attenuation.   OSA (obstructive sleep apnea)     Past Surgical History:  Procedure Laterality Date   BACK SURGERY     CARDIAC CATHETERIZATION     CHOLECYSTECTOMY     COLONOSCOPY WITH PROPOFOL N/A 04/12/2019   Procedure: COLONOSCOPY WITH PROPOFOL;  Surgeon: [name], MD;  Location: [facility] ENDOSCOPY;  Service:  Gastroenterology;  Laterality: N/A;   ESOPHAGOGASTRODUODENOSCOPY N/A 04/12/2019   Procedure: ESOPHAGOGASTRODUODENOSCOPY (EGD);  Surgeon: [name], MD;  Location: [facility] ENDOSCOPY;  Service: Gastroenterology;  Laterality: N/A;   ESOPHAGOGASTRODUODENOSCOPY (EGD) WITH PROPOFOL N/A 11/21/2019   Procedure: ESOPHAGOGASTRODUODENOSCOPY (EGD) WITH PROPOFOL;  Surgeon: [name], MD;  Location: [facility] ENDOSCOPY;  Service: Gastroenterology;  Laterality: N/A;   FLEXIBLE BRONCHOSCOPY Bilateral 05/17/2019   Procedure: FLEXIBLE BRONCHOSCOPY;  Surgeon: [name], MD;  Location: [name] ORS;  Service: Pulmonary;  Laterality: Bilateral;   left arm surgery     nephrectomy Left    PARATHYROIDECTOMY     RIGHT HEART CATH N/A 03/29/2019   Procedure: RIGHT HEART CATH;  Surgeon: [name], MD;  Location: [place] CV LAB;  Service: Cardiovascular;  Laterality: N/A;    MEDICATIONS:  Prior to Admission medications   Medication Sig Start Date End Date Taking? Authorizing Provider  albuterol (VENTOLIN HFA) 108 (90 Base) MCG/ACT inhaler Inhale 2 puffs into the lungs every 6 (six) hours as needed for wheezing or shortness of breath. 08/31/20   Lavera Guise, MD  amLODipine (NORVASC) 10 MG tablet Take 1 tablet (10 mg total) by mouth daily. 03/23/21   [name], NP  apixaban (ELIQUIS) 2.5 MG TABS tablet Take 1 tablet (2.5 mg total) by mouth 2 (two) times daily. 05/20/21   [name], NP  Blood Glucose  Calibration (TRUE METRIX LEVEL 1) Low SOLN Use as directed 04/04/18   [name], NP  Blood Glucose Monitoring Suppl (TRUE METRIX AIR GLUCOSE METER) DEVI 1 Device by Does not apply route 2 (two) times daily. 04/13/18   [name], NP  Budeson-Glycopyrrol-Formoterol (BREZTRI AEROSPHERE) 160-9-4.8 MCG/ACT AERO Inhale 2 puffs into the lungs in the morning and at bedtime. 02/23/21   [name], NP  carvedilol (COREG) 12.5 MG tablet Take 1 tablet (12.5 mg total) by mouth 2  (two) times daily. 01/07/21 01/07/22  Arta Silence, MD  cetirizine (ZYRTEC) 10 MG tablet Take 10 mg by mouth daily.    [provider]  clindamycin (CLINDAGEL) 1 % gel Apply to the affected area two times daily until resolved. Then use as needed for flare ups 05/20/21   [name], NP  escitalopram (LEXAPRO) 10 MG tablet Take one tab po qd for anxiety with food 02/23/21   [name], NP  furosemide (LASIX) 40 MG tablet Take one to tabs once or twice days for fluid // swelling 02/23/21   [name], NP  gabapentin (NEURONTIN) 100 MG capsule Take 1 capsule (100 mg total) by mouth in the morning, at noon, and at bedtime. 02/23/21   [name], NP  glimepiride (AMARYL) 2 MG tablet Take one tab po qd with supper 02/23/21   [name], NP  glucose blood (TRUE METRIX BLOOD GLUCOSE TEST) test strip Use as instructed twice daily diag E11.65 07/30/19   [name], NP  hydrALAZINE (APRESOLINE) 100 MG tablet Take 1 tablet (100 mg total) by mouth 3 (three) times daily. 02/23/21   [name], NP  insulin glargine, 1 Unit Dial, (TOUJEO) 300 UNIT/ML Solostar Pen Inject 10 Units into the skin daily. Reduced from prior 26 units daily. 03/07/21   Enzo Bi, MD  ipratropium-albuterol (DUONEB) 0.5-2.5 (3) MG/3ML SOLN Take 3 mLs by nebulization every 6 (six) hours as needed. 10/11/20   Lavera Guise, MD  isosorbide mononitrate (IMDUR) 30 MG 24 hr tablet Take 1 tablet (30 mg total) by mouth 2 (two) times daily. 10/08/20   Jennye Boroughs, MD  meclizine (ANTIVERT) 25 MG tablet Take 1 tablet (25 mg total) by mouth 3 (three) times daily as needed for dizziness or nausea. 11/25/20   Paulette Blanch, MD  NOVOFINE PEN NEEDLE 32G X 6 MM MISC Use as directed 10/15/20   Lavera Guise, MD  omeprazole (PRILOSEC) 40 MG capsule Take 1 capsule (40 mg total) by mouth in the morning and at bedtime. 05/12/21   [name], NP  potassium chloride SA (KLOR-CON) 20 MEQ tablet Take 1 tablet (20  mEq total) by mouth 2 (two) times daily as needed. When you take Lasix. 03/07/21   Enzo Bi, MD  rosuvastatin (CRESTOR) 10 MG tablet Take 1 tablet (10 mg total) by mouth at bedtime. 02/23/21   [name], NP  TRUEplus Lancets 33G MISC Use as directed twice daily diag E11.65 07/30/19   [name], NP  vitamin B-12 1000 MCG tablet Take 1 tablet (1,000 mcg total) by mouth daily. 01/16/21   Fritzi Mandes, MD    Physical Exam   Triage Vital Signs: ED Triage Vitals [06/27/21 1451]  Enc Vitals Group     BP (!) 139/93     Pulse Rate 69     Resp 18     Temp 98.1 F (36.7 C)     Temp Source Oral     SpO2 95 %  Weight 270 lb (122.5 kg)     Height _0  (1.803 m)     Head Circumference      Peak Flow      Pain Score 7     Pain Loc      Pain Edu?      Excl. in [place]?     Most recent vital signs: Vitals:   06/28/21 0530 06/28/21 0600  BP: (!) 150/92 (!) 135/103  Pulse: (!) 102 (!) 44  Resp: 19 19  Temp:    SpO2: 96% 98%    CONSTITUTIONAL: Alert and oriented and responds appropriately to questions. Well-appearing; well-nourished HEAD: Normocephalic, atraumatic EYES: Conjunctivae clear, pupils appear equal, sclera nonicteric ENT: normal nose; moist mucous membranes NECK: Supple, normal ROM CARD: RRR; S1 and S2 appreciated; no murmurs, no clicks, no rubs, no gallops CHEST:  Chest wall is tender to palpation.  No crepitus, ecchymosis, erythema, warmth, rash or other lesions present.   RESP: Patient is tachypneic at rest.  He is not hypoxic.  He has diffuse inspiratory and expiratory wheezing.  No rhonchi or rales.  Speaking full sentences.  No distress. ABD/GI: Normal bowel sounds; non-distended; soft, non-tender, no rebound, no guarding, no peritoneal signs BACK: The back appears normal EXT: Normal ROM in all joints; no deformity noted, no edema; no cyanosis SKIN: Normal color for age and race; warm; hidradenitis of the right axilla NEURO: Moves all extremities equally,  normal speech PSYCH: The patient's mood and manner are appropriate.   ED Results / Procedures / Treatments   LABS: (all labs ordered are listed, but only abnormal results are displayed) Labs Reviewed  BASIC METABOLIC PANEL - Abnormal; Notable for the following components:      Result Value   CO2 20 (*)    Glucose, Bld 158 (*)    BUN 51 (*)    Creatinine, Ser 2.38 (*)    GFR, Estimated 29 (*)    All other components within normal limits  CBC - Abnormal; Notable for the following components:   Hemoglobin 11.6 (*)    HCT 36.7 (*)    MCH 25.7 (*)    All other components within normal limits  D-DIMER, QUANTITATIVE - Abnormal; Notable for the following components:   D-Dimer, Quant 1.91 (*)    All other components within normal limits  HEPATIC FUNCTION PANEL - Abnormal; Notable for the following components:   Albumin 3.2 (*)    Indirect Bilirubin 1.0 (*)    All other components within normal limits  CBC - Abnormal; Notable for the following components:   Hemoglobin 11.3 (*)    HCT 34.4 (*)    MCV 79.8 (*)    All other components within normal limits  CREATININE, SERUM - Abnormal; Notable for the following components:   Creatinine, Ser 2.52 (*)    GFR, Estimated 27 (*)    All other components within normal limits  TROPONIN I (HIGH SENSITIVITY) - Abnormal; Notable for the following components:   Troponin I (High Sensitivity) 18 (*)    All other components within normal limits  RESP PANEL BY RT-PCR (FLU A&B, COVID) ARPGX2  BRAIN NATRIURETIC PEPTIDE  LIPASE, BLOOD  URINALYSIS, ROUTINE W REFLEX MICROSCOPIC  BASIC METABOLIC PANEL  CBC  TROPONIN I (HIGH SENSITIVITY)     EKG:  EKG Interpretation  Date/Time:  Sunday June 27 2021 14:48:41 EST Ventricular Rate:  73 PR Interval:    QRS Duration: 84 QT Interval:  384 QTC Calculation: 423 R Axis:  48 Text Interpretation: Atrial flutter with variable A-V block Cannot rule out Anterior infarct (cited on or before  11-Jun-2021) Abnormal ECG When compared with ECG of 11-Jun-2021 14:31, Atrial flutter has replaced Atrial fibrillation ST elevation now present in Inferior leads Non-specific change in ST segment in Lateral leads T wave inversion no longer evident in Inferior leads Confirmed by Pryor Curia [phone]) on 06/28/2021 12:53:36 AM         RADIOLOGY: My personal review and interpretation of imaging: Chest x-ray shows no acute abnormality.  I have personally reviewed all radiology reports.   DG Chest 2 View  Result Date: 06/27/2021 CLINICAL DATA:  Chest pain and nausea vomiting since this morning. EXAM: CHEST - 2 VIEW COMPARISON:  06/11/2021 and older studies. FINDINGS: Cardiac silhouette mildly enlarged.  No mediastinal or hilar masses. Prominent vascular markings similar to the prior study. No lung consolidation. No convincing edema. No pleural effusion or pneumothorax. Skeletal structures are grossly intact. IMPRESSION: No acute cardiopulmonary disease. Electronically Signed   By: Lajean Manes M.D.   On: 06/27/2021 15:27     PROCEDURES:  Critical Care performed: No   CRITICAL CARE Performed by: Cyril Mourning Tevan Marian   Total critical care time: 0 minutes  Critical care time was exclusive of separately billable procedures and treating other patients.  Critical care was necessary to treat or prevent imminent or life-threatening deterioration.  Critical care was time spent personally by me on the following activities: development of treatment plan with patient and/or surrogate as well as nursing, discussions with consultants, evaluation of patient's response to treatment, examination of patient, obtaining history from patient or surrogate, ordering and performing treatments and interventions, ordering and review of laboratory studies, ordering and review of radiographic studies, pulse oximetry and re-evaluation of patient's condition.   Procedures    IMPRESSION / MDM / ASSESSMENT AND PLAN / ED  COURSE  I reviewed the triage vital signs and the nursing notes.    Patient here with complaints of atypical right-sided chest pain reproducible with palpation, shortness of breath, cough and wheezing.  The patient is on the cardiac monitor to evaluate for evidence of arrhythmia and/or significant heart rate changes.   DIFFERENTIAL DIAGNOSIS (includes but not limited to):   I suspect that patient is having a COPD exacerbation.  He may also have a viral illness versus pneumonia given his reports of productive cough and chills.  He is afebrile here.  CHF is also on the differential.  Symptoms seem atypical for ACS.  I am also concerned about a possible PE.  Chest pain could also be musculoskeletal.   PLAN: I will obtain labs, chest x-ray, EKG.  Will give breathing treatments, steroids.  Patient may need admission.  We will also obtain a COVID and flu swab.   MEDICATIONS GIVEN IN ED: Medications  albuterol (PROVENTIL) (2.5 MG/3ML) 0.083% nebulizer solution 2.5 mg (has no administration in time range)  gabapentin (NEURONTIN) capsule 100 mg (has no administration in time range)  amLODipine (NORVASC) tablet 10 mg (has no administration in time range)  carvedilol (COREG) tablet 12.5 mg (has no administration in time range)  hydrALAZINE (APRESOLINE) tablet 100 mg (has no administration in time range)  isosorbide mononitrate (IMDUR) 24 hr tablet 30 mg (has no administration in time range)  rosuvastatin (CRESTOR) tablet 10 mg (has no administration in time range)  glimepiride (AMARYL) tablet 2 mg (has no administration in time range)  insulin glargine-yfgn (SEMGLEE) injection 10 Units (has no administration in time range)  meclizine (ANTIVERT) tablet 25 mg (has no administration in time range)  pantoprazole (PROTONIX) EC tablet 40 mg (has no administration in time range)  apixaban (ELIQUIS) tablet 2.5 mg (has no administration in time range)  vitamin B-12 (CYANOCOBALAMIN) tablet 1,000 mcg (has no  administration in time range)  loratadine (CLARITIN) tablet 10 mg (has no administration in time range)  ipratropium-albuterol (DUONEB) 0.5-2.5 (3) MG/3ML nebulizer solution 3 mL (has no administration in time range)  clindamycin (CLINDAGEL) 1 % gel (has no administration in time range)  methylPREDNISolone sodium succinate (SOLU-MEDROL) 40 mg/mL injection 40 mg (has no administration in time range)    Followed by  predniSONE (DELTASONE) tablet 40 mg (has no administration in time range)  0.9 %  sodium chloride infusion ( Intravenous New Bag/Given 06/28/21 0253)  acetaminophen (TYLENOL) tablet 650 mg (has no administration in time range)    Or  acetaminophen (TYLENOL) suppository 650 mg (has no administration in time range)  traZODone (DESYREL) tablet 25 mg (has no administration in time range)  magnesium hydroxide (MILK OF MAGNESIA) suspension 30 mL (has no administration in time range)  ondansetron (ZOFRAN) tablet 4 mg (has no administration in time range)    Or  ondansetron (ZOFRAN) injection 4 mg (has no administration in time range)  furosemide (LASIX) tablet 40 mg (has no administration in time range)    And  potassium chloride SA (KLOR-CON M) CR tablet 20 mEq (has no administration in time range)  mometasone-formoterol (DULERA) 200-5 MCG/ACT inhaler 2 puff (has no administration in time range)    And  umeclidinium bromide (INCRUSE ELLIPTA) 62.5 MCG/ACT 1 puff (has no administration in time range)  ipratropium-albuterol (DUONEB) 0.5-2.5 (3) MG/3ML nebulizer solution 3 mL (3 mLs Nebulization Given 06/28/21 0005)  ondansetron (ZOFRAN) injection 4 mg (4 mg Intravenous Given 06/27/21 2355)  methylPREDNISolone sodium succinate (SOLU-MEDROL) 125 mg/2 mL injection 125 mg (125 mg Intravenous Given 06/27/21 2355)  fentaNYL (SUBLIMAZE) injection 50 mcg (50 mcg Intravenous Given 06/27/21 2355)  ipratropium-albuterol (DUONEB) 0.5-2.5 (3) MG/3ML nebulizer solution 3 mL (3 mLs Nebulization Given 06/28/21  0114)     ED COURSE: Patient has had some improvement in his tachypnea and wheezing with breathing treatments.  His chest x-ray has been read by myself and radiologist and shows no infiltrate, edema or pneumothorax.  His troponin is negative.  COVID and flu negative.  D-dimer elevated but due to his chronic kidney disease, patient cannot get a CTA of his chest.  Will order a VQ scan which can be done in the morning.  Given he is hemodynamically stable, I do not feel he needs to be given IV heparin currently.  Will discuss with medicine for admission.   CONSULTS:  Consulted and discussed patient's case with hospitalist, Dr. Sidney Ace.  I have recommended admission and consulting physician agrees and will place admission orders.  Patient (and family if present) agree with this plan.   I reviewed all nursing notes, vitals, pertinent previous records.  All labs, EKGs, imaging ordered have been independently reviewed and interpreted by myself.     OUTSIDE RECORDS REVIEWED: Reviewed patient's last admission in September 2022.  Patient was admitted for right upper extremity cellulitis, acute on chronic kidney injury.  He does have hidradenitis in the right axilla today without signs of surrounding cellulitis.  He reports this has been there for "a long time".  It is not draining.  Nothing that looks like it needs emergent drainage or antibiotics at this time.  FINAL CLINICAL IMPRESSION(S) / ED DIAGNOSES   Final diagnoses:  COPD with acute exacerbation ([place])  Right-sided chest pain     Rx / DC Orders   ED Discharge Orders     None        Note:  This document was prepared using Dragon voice recognition software and may include unintentional dictation errors.   Ernest Popowski, Delice Bison, DO 06/28/21 [phone]

## 2021-06-27 NOTE — ED Triage Notes (Signed)
Pt in via EMS from home with c/o CP, NV since this am. Pt reports overalls feels bad. Pt ambulatory in NAD. 154/86, 95% RA, HR 70, FSBS 182

## 2021-06-28 ENCOUNTER — Inpatient Hospital Stay: Payer: Medicare PPO

## 2021-06-28 DIAGNOSIS — E1165 Type 2 diabetes mellitus with hyperglycemia: Secondary | ICD-10-CM | POA: Diagnosis present

## 2021-06-28 DIAGNOSIS — N189 Chronic kidney disease, unspecified: Secondary | ICD-10-CM | POA: Diagnosis present

## 2021-06-28 DIAGNOSIS — Z20822 Contact with and (suspected) exposure to covid-19: Secondary | ICD-10-CM | POA: Diagnosis present

## 2021-06-28 DIAGNOSIS — I10 Essential (primary) hypertension: Secondary | ICD-10-CM

## 2021-06-28 DIAGNOSIS — D6859 Other primary thrombophilia: Secondary | ICD-10-CM | POA: Diagnosis present

## 2021-06-28 DIAGNOSIS — R079 Chest pain, unspecified: Secondary | ICD-10-CM | POA: Diagnosis present

## 2021-06-28 DIAGNOSIS — E1122 Type 2 diabetes mellitus with diabetic chronic kidney disease: Secondary | ICD-10-CM | POA: Diagnosis present

## 2021-06-28 DIAGNOSIS — L732 Hidradenitis suppurativa: Secondary | ICD-10-CM | POA: Diagnosis present

## 2021-06-28 DIAGNOSIS — D6869 Other thrombophilia: Secondary | ICD-10-CM | POA: Diagnosis present

## 2021-06-28 DIAGNOSIS — I4430 Unspecified atrioventricular block: Secondary | ICD-10-CM | POA: Diagnosis present

## 2021-06-28 DIAGNOSIS — Z833 Family history of diabetes mellitus: Secondary | ICD-10-CM | POA: Diagnosis not present

## 2021-06-28 DIAGNOSIS — D631 Anemia in chronic kidney disease: Secondary | ICD-10-CM | POA: Diagnosis present

## 2021-06-28 DIAGNOSIS — I4892 Unspecified atrial flutter: Secondary | ICD-10-CM | POA: Diagnosis present

## 2021-06-28 DIAGNOSIS — J440 Chronic obstructive pulmonary disease with acute lower respiratory infection: Secondary | ICD-10-CM

## 2021-06-28 DIAGNOSIS — Z8249 Family history of ischemic heart disease and other diseases of the circulatory system: Secondary | ICD-10-CM | POA: Diagnosis not present

## 2021-06-28 DIAGNOSIS — I130 Hypertensive heart and chronic kidney disease with heart failure and stage 1 through stage 4 chronic kidney disease, or unspecified chronic kidney disease: Secondary | ICD-10-CM | POA: Diagnosis present

## 2021-06-28 DIAGNOSIS — I5042 Chronic combined systolic (congestive) and diastolic (congestive) heart failure: Secondary | ICD-10-CM | POA: Diagnosis present

## 2021-06-28 DIAGNOSIS — E785 Hyperlipidemia, unspecified: Secondary | ICD-10-CM | POA: Diagnosis present

## 2021-06-28 DIAGNOSIS — J9601 Acute respiratory failure with hypoxia: Secondary | ICD-10-CM | POA: Diagnosis present

## 2021-06-28 DIAGNOSIS — J441 Chronic obstructive pulmonary disease with (acute) exacerbation: Secondary | ICD-10-CM | POA: Diagnosis present

## 2021-06-28 DIAGNOSIS — Z6837 Body mass index (BMI) 37.0-37.9, adult: Secondary | ICD-10-CM | POA: Diagnosis not present

## 2021-06-28 DIAGNOSIS — G4733 Obstructive sleep apnea (adult) (pediatric): Secondary | ICD-10-CM | POA: Diagnosis present

## 2021-06-28 DIAGNOSIS — E1151 Type 2 diabetes mellitus with diabetic peripheral angiopathy without gangrene: Secondary | ICD-10-CM | POA: Diagnosis present

## 2021-06-28 DIAGNOSIS — I2510 Atherosclerotic heart disease of native coronary artery without angina pectoris: Secondary | ICD-10-CM | POA: Diagnosis present

## 2021-06-28 DIAGNOSIS — Z801 Family history of malignant neoplasm of trachea, bronchus and lung: Secondary | ICD-10-CM | POA: Diagnosis not present

## 2021-06-28 DIAGNOSIS — J209 Acute bronchitis, unspecified: Secondary | ICD-10-CM

## 2021-06-28 DIAGNOSIS — N184 Chronic kidney disease, stage 4 (severe): Secondary | ICD-10-CM | POA: Diagnosis present

## 2021-06-28 DIAGNOSIS — Z7901 Long term (current) use of anticoagulants: Secondary | ICD-10-CM | POA: Diagnosis not present

## 2021-06-28 DIAGNOSIS — K219 Gastro-esophageal reflux disease without esophagitis: Secondary | ICD-10-CM | POA: Diagnosis present

## 2021-06-28 LAB — D-DIMER, QUANTITATIVE: D-Dimer, Quant: 1.91 ug/mL-FEU — ABNORMAL HIGH (ref 0.00–0.50)

## 2021-06-28 LAB — RESP PANEL BY RT-PCR (FLU A&B, COVID) ARPGX2
Influenza A by PCR: NEGATIVE
Influenza B by PCR: NEGATIVE
SARS Coronavirus 2 by RT PCR: NEGATIVE

## 2021-06-28 LAB — HEPATIC FUNCTION PANEL
ALT: 19 U/L (ref 0–44)
AST: 21 U/L (ref 15–41)
Albumin: 3.2 g/dL — ABNORMAL LOW (ref 3.5–5.0)
Alkaline Phosphatase: 75 U/L (ref 38–126)
Bilirubin, Direct: 0.2 mg/dL (ref 0.0–0.2)
Indirect Bilirubin: 1 mg/dL — ABNORMAL HIGH (ref 0.3–0.9)
Total Bilirubin: 1.2 mg/dL (ref 0.3–1.2)
Total Protein: 7.6 g/dL (ref 6.5–8.1)

## 2021-06-28 LAB — CBG MONITORING, ED
Glucose-Capillary: 428 mg/dL — ABNORMAL HIGH (ref 70–99)
Glucose-Capillary: 488 mg/dL — ABNORMAL HIGH (ref 70–99)

## 2021-06-28 LAB — URINALYSIS, ROUTINE W REFLEX MICROSCOPIC
Bilirubin Urine: NEGATIVE
Glucose, UA: 500 mg/dL — AB
Hgb urine dipstick: NEGATIVE
Ketones, ur: NEGATIVE mg/dL
Leukocytes,Ua: NEGATIVE
Nitrite: NEGATIVE
Protein, ur: >300 mg/dL — AB
Specific Gravity, Urine: 1.02 (ref 1.005–1.030)
pH: 5 (ref 5.0–8.0)

## 2021-06-28 LAB — CBC
HCT: 34.4 % — ABNORMAL LOW (ref 39.0–52.0)
HCT: 34.9 % — ABNORMAL LOW (ref 39.0–52.0)
Hemoglobin: 11.3 g/dL — ABNORMAL LOW (ref 13.0–17.0)
Hemoglobin: 11.2 g/dL — ABNORMAL LOW (ref 13.0–17.0)
MCH: 26.2 pg (ref 26.0–34.0)
MCH: 25.6 pg — ABNORMAL LOW (ref 26.0–34.0)
MCHC: 32.8 g/dL (ref 30.0–36.0)
MCHC: 32.1 g/dL (ref 30.0–36.0)
MCV: 79.8 fL — ABNORMAL LOW (ref 80.0–100.0)
MCV: 79.7 fL — ABNORMAL LOW (ref 80.0–100.0)
Platelets: 194 10*3/uL (ref 150–400)
Platelets: 188 10*3/uL (ref 150–400)
RBC: 4.31 MIL/uL (ref 4.22–5.81)
RBC: 4.38 MIL/uL (ref 4.22–5.81)
RDW: 14.9 % (ref 11.5–15.5)
RDW: 14.9 % (ref 11.5–15.5)
WBC: 7.5 10*3/uL (ref 4.0–10.5)
WBC: 5.9 10*3/uL (ref 4.0–10.5)
nRBC: 0 % (ref 0.0–0.2)
nRBC: 0 % (ref 0.0–0.2)

## 2021-06-28 LAB — GLUCOSE, CAPILLARY
Glucose-Capillary: 391 mg/dL — ABNORMAL HIGH (ref 70–99)
Glucose-Capillary: 349 mg/dL — ABNORMAL HIGH (ref 70–99)
Glucose-Capillary: 251 mg/dL — ABNORMAL HIGH (ref 70–99)

## 2021-06-28 LAB — BASIC METABOLIC PANEL
Anion gap: 9 (ref 5–15)
BUN: 55 mg/dL — ABNORMAL HIGH (ref 8–23)
CO2: 20 mmol/L — ABNORMAL LOW (ref 22–32)
Calcium: 8.7 mg/dL — ABNORMAL LOW (ref 8.9–10.3)
Chloride: 105 mmol/L (ref 98–111)
Creatinine, Ser: 2.59 mg/dL — ABNORMAL HIGH (ref 0.61–1.24)
GFR, Estimated: 26 mL/min — ABNORMAL LOW (ref >60)
Glucose, Bld: 334 mg/dL — ABNORMAL HIGH (ref 70–99)
Potassium: 4.2 mmol/L (ref 3.5–5.1)
Sodium: 134 mmol/L — ABNORMAL LOW (ref 135–145)

## 2021-06-28 LAB — CREATININE, SERUM
Creatinine, Ser: 2.52 mg/dL — ABNORMAL HIGH (ref 0.61–1.24)
GFR, Estimated: 27 mL/min — ABNORMAL LOW (ref >60)

## 2021-06-28 LAB — LIPASE, BLOOD: Lipase: 47 U/L (ref 11–51)

## 2021-06-28 LAB — BRAIN NATRIURETIC PEPTIDE: B Natriuretic Peptide: 66.3 pg/mL (ref 0.0–100.0)

## 2021-06-28 MED ORDER — INSULIN ASPART 100 UNIT/ML IJ SOLN
0.0000 [IU] | Freq: Three times a day (TID) | INTRAMUSCULAR | Status: DC
  Administered 2021-06-28: 15 [IU] via SUBCUTANEOUS
  Filled 2021-06-28: qty 1

## 2021-06-28 MED ORDER — FUROSEMIDE 40 MG PO TABS: 40.0000 mg | ORAL_TABLET | Freq: Two times a day (BID) | ORAL | Status: DC

## 2021-06-28 MED ORDER — PANTOPRAZOLE SODIUM 40 MG PO TBEC
40.0000 mg | DELAYED_RELEASE_TABLET | Freq: Every day | ORAL | Status: DC
  Administered 2021-06-28 – 2021-06-29 (×2): 40 mg via ORAL
  Filled 2021-06-28 (×2): qty 1

## 2021-06-28 MED ORDER — PREDNISONE 20 MG PO TABS: 40.0000 mg | ORAL_TABLET | Freq: Every day | ORAL | Status: DC

## 2021-06-28 MED ORDER — METHYLPREDNISOLONE SODIUM SUCC 40 MG IJ SOLR
40.0000 mg | Freq: Two times a day (BID) | INTRAMUSCULAR | Status: DC
  Administered 2021-06-28: 40 mg via INTRAVENOUS
  Filled 2021-06-28: qty 1

## 2021-06-28 MED ORDER — SODIUM CHLORIDE 0.9 % IV SOLN: INTRAVENOUS | Status: DC

## 2021-06-28 MED ORDER — ONDANSETRON HCL 4 MG PO TABS: 4.0000 mg | ORAL_TABLET | Freq: Four times a day (QID) | ORAL | Status: DC | PRN

## 2021-06-28 MED ORDER — ALBUTEROL SULFATE (2.5 MG/3ML) 0.083% IN NEBU: 2.5000 mg | INHALATION_SOLUTION | RESPIRATORY_TRACT | Status: DC | PRN

## 2021-06-28 MED ORDER — GABAPENTIN 100 MG PO CAPS
100.0000 mg | ORAL_CAPSULE | Freq: Three times a day (TID) | ORAL | Status: DC
  Administered 2021-06-28 – 2021-06-29 (×4): 100 mg via ORAL
  Filled 2021-06-28 (×4): qty 1

## 2021-06-28 MED ORDER — TRAZODONE HCL 50 MG PO TABS: 25.0000 mg | ORAL_TABLET | Freq: Every evening | ORAL | Status: DC | PRN

## 2021-06-28 MED ORDER — TECHNETIUM TO 99M ALBUMIN AGGREGATED
4.0000 | Freq: Once | INTRAVENOUS | Status: AC | PRN
  Administered 2021-06-28: 4.48 via INTRAVENOUS
  Filled 2021-06-28: qty 4

## 2021-06-28 MED ORDER — HYDRALAZINE HCL 50 MG PO TABS
100.0000 mg | ORAL_TABLET | Freq: Three times a day (TID) | ORAL | Status: DC
  Administered 2021-06-28 – 2021-06-29 (×4): 100 mg via ORAL
  Filled 2021-06-28 (×4): qty 2

## 2021-06-28 MED ORDER — CARVEDILOL 12.5 MG PO TABS
12.5000 mg | ORAL_TABLET | Freq: Two times a day (BID) | ORAL | Status: DC
  Administered 2021-06-28 – 2021-06-29 (×3): 12.5 mg via ORAL
  Filled 2021-06-28: qty 1
  Filled 2021-06-28: qty 2
  Filled 2021-06-28: qty 1

## 2021-06-28 MED ORDER — CLINDAMYCIN PHOSPHATE 1 % EX GEL
Freq: Two times a day (BID) | CUTANEOUS | Status: DC | PRN
  Filled 2021-06-28: qty 30

## 2021-06-28 MED ORDER — LORATADINE 10 MG PO TABS
10.0000 mg | ORAL_TABLET | Freq: Every day | ORAL | Status: DC
  Administered 2021-06-28 – 2021-06-29 (×2): 10 mg via ORAL
  Filled 2021-06-28 (×2): qty 1

## 2021-06-28 MED ORDER — ALBUTEROL SULFATE HFA 108 (90 BASE) MCG/ACT IN AERS: 2.0000 | INHALATION_SPRAY | Freq: Four times a day (QID) | RESPIRATORY_TRACT | Status: DC | PRN

## 2021-06-28 MED ORDER — MAGNESIUM HYDROXIDE 400 MG/5ML PO SUSP: 30.0000 mL | Freq: Every day | ORAL | Status: DC | PRN

## 2021-06-28 MED ORDER — APIXABAN 2.5 MG PO TABS
2.5000 mg | ORAL_TABLET | Freq: Two times a day (BID) | ORAL | Status: DC
  Administered 2021-06-28 – 2021-06-29 (×3): 2.5 mg via ORAL
  Filled 2021-06-28 (×4): qty 1

## 2021-06-28 MED ORDER — ORAL CARE MOUTH RINSE
15.0000 mL | Freq: Two times a day (BID) | OROMUCOSAL | Status: DC
  Administered 2021-06-29: 15 mL via OROMUCOSAL

## 2021-06-28 MED ORDER — INSULIN ASPART 100 UNIT/ML IJ SOLN
0.0000 [IU] | Freq: Every day | INTRAMUSCULAR | Status: DC
  Administered 2021-06-28: 3 [IU] via SUBCUTANEOUS
  Filled 2021-06-28: qty 1

## 2021-06-28 MED ORDER — POTASSIUM CHLORIDE CRYS ER 20 MEQ PO TBCR: 20.0000 meq | EXTENDED_RELEASE_TABLET | Freq: Two times a day (BID) | ORAL | Status: DC | PRN

## 2021-06-28 MED ORDER — MECLIZINE HCL 25 MG PO TABS
25.0000 mg | ORAL_TABLET | Freq: Three times a day (TID) | ORAL | Status: DC | PRN
  Filled 2021-06-28: qty 1

## 2021-06-28 MED ORDER — IPRATROPIUM-ALBUTEROL 0.5-2.5 (3) MG/3ML IN SOLN
3.0000 mL | Freq: Once | RESPIRATORY_TRACT | Status: AC
  Administered 2021-06-28: 3 mL via RESPIRATORY_TRACT
  Filled 2021-06-28: qty 3

## 2021-06-28 MED ORDER — INSULIN GLARGINE-YFGN 100 UNIT/ML ~~LOC~~ SOLN
10.0000 [IU] | Freq: Every day | SUBCUTANEOUS | Status: DC
  Administered 2021-06-28: 10 [IU] via SUBCUTANEOUS
  Filled 2021-06-28 (×2): qty 0.1

## 2021-06-28 MED ORDER — ROSUVASTATIN CALCIUM 10 MG PO TABS
10.0000 mg | ORAL_TABLET | Freq: Every day | ORAL | Status: DC
  Administered 2021-06-28: 10 mg via ORAL
  Filled 2021-06-28 (×2): qty 1

## 2021-06-28 MED ORDER — ISOSORBIDE MONONITRATE ER 30 MG PO TB24
30.0000 mg | ORAL_TABLET | Freq: Two times a day (BID) | ORAL | Status: DC
  Administered 2021-06-28 – 2021-06-29 (×3): 30 mg via ORAL
  Filled 2021-06-28 (×3): qty 1

## 2021-06-28 MED ORDER — ONDANSETRON HCL 4 MG/2ML IJ SOLN: 4.0000 mg | Freq: Four times a day (QID) | INTRAMUSCULAR | Status: DC | PRN

## 2021-06-28 MED ORDER — INSULIN GLARGINE-YFGN 100 UNIT/ML ~~LOC~~ SOLN
20.0000 [IU] | Freq: Every day | SUBCUTANEOUS | Status: DC
  Filled 2021-06-28: qty 0.2

## 2021-06-28 MED ORDER — ACETAMINOPHEN 325 MG PO TABS: 650.0000 mg | ORAL_TABLET | Freq: Four times a day (QID) | ORAL | Status: DC | PRN

## 2021-06-28 MED ORDER — INSULIN ASPART 100 UNIT/ML IJ SOLN
6.0000 [IU] | Freq: Three times a day (TID) | INTRAMUSCULAR | Status: DC
  Administered 2021-06-28 – 2021-06-29 (×3): 6 [IU] via SUBCUTANEOUS
  Filled 2021-06-28 (×3): qty 1

## 2021-06-28 MED ORDER — GLIMEPIRIDE 2 MG PO TABS: 2.0000 mg | ORAL_TABLET | Freq: Every day | ORAL | Status: DC

## 2021-06-28 MED ORDER — ESCITALOPRAM OXALATE 10 MG PO TABS
10.0000 mg | ORAL_TABLET | Freq: Every day | ORAL | Status: DC
Start: 2021-06-28 — End: 2021-06-28

## 2021-06-28 MED ORDER — IPRATROPIUM-ALBUTEROL 0.5-2.5 (3) MG/3ML IN SOLN: 3.0000 mL | Freq: Four times a day (QID) | RESPIRATORY_TRACT | Status: DC | PRN

## 2021-06-28 MED ORDER — INSULIN ASPART 100 UNIT/ML IJ SOLN
0.0000 [IU] | Freq: Three times a day (TID) | INTRAMUSCULAR | Status: DC
  Administered 2021-06-28: 15 [IU] via SUBCUTANEOUS
  Administered 2021-06-29: 7 [IU] via SUBCUTANEOUS
  Filled 2021-06-28 (×2): qty 1

## 2021-06-28 MED ORDER — FUROSEMIDE 40 MG PO TABS: 40.0000 mg | ORAL_TABLET | Freq: Two times a day (BID) | ORAL | Status: DC | PRN

## 2021-06-28 MED ORDER — AMLODIPINE BESYLATE 10 MG PO TABS
10.0000 mg | ORAL_TABLET | Freq: Every day | ORAL | Status: DC
  Administered 2021-06-28 – 2021-06-29 (×2): 10 mg via ORAL
  Filled 2021-06-28: qty 2
  Filled 2021-06-28: qty 1

## 2021-06-28 MED ORDER — VITAMIN B-12 1000 MCG PO TABS
1000.0000 ug | ORAL_TABLET | Freq: Every day | ORAL | Status: DC
  Administered 2021-06-28 – 2021-06-29 (×2): 1000 ug via ORAL
  Filled 2021-06-28 (×2): qty 1

## 2021-06-28 MED ORDER — ACETAMINOPHEN 650 MG RE SUPP: 650.0000 mg | Freq: Four times a day (QID) | RECTAL | Status: DC | PRN

## 2021-06-28 MED ORDER — BUDESON-GLYCOPYRROL-FORMOTEROL 160-9-4.8 MCG/ACT IN AERO: 2.0000 | INHALATION_SPRAY | Freq: Two times a day (BID) | RESPIRATORY_TRACT | Status: DC

## 2021-06-28 MED ORDER — MOMETASONE FURO-FORMOTEROL FUM 200-5 MCG/ACT IN AERO
2.0000 | INHALATION_SPRAY | Freq: Two times a day (BID) | RESPIRATORY_TRACT | Status: DC
  Administered 2021-06-28: 2 via RESPIRATORY_TRACT
  Filled 2021-06-28 (×2): qty 8.8

## 2021-06-28 MED ORDER — UMECLIDINIUM BROMIDE 62.5 MCG/ACT IN AEPB
1.0000 | INHALATION_SPRAY | Freq: Every day | RESPIRATORY_TRACT | Status: DC
  Administered 2021-06-28 – 2021-06-29 (×2): 1 via RESPIRATORY_TRACT
  Filled 2021-06-28 (×2): qty 7

## 2021-06-28 NOTE — Assessment & Plan Note (Signed)
VQ negative. CXR clear.  Likely musculoskeletal.

## 2021-06-28 NOTE — Assessment & Plan Note (Signed)
-   Increase Lantus - Increase SS corrections - Add mealtime - Add back home glimepiride

## 2021-06-28 NOTE — Hospital Course (Signed)
Mr. Glantz is a 66 y.o. M with COPD, DM, HTN, sCHF, CKD IV baseline Cr 1.8-2.4, who presented with 1 day new dyspnea, productive cough and wheezing with pleuritic chest pain.  In the ER, SpO2 89%, dyspneic.  Cr 2.4, CXR clear, dimer elevated.  Started on steroids and admitted for COPD flare.

## 2021-06-28 NOTE — ED Notes (Signed)
Pt taken for Pulmonary Perfusion test at this time.

## 2021-06-28 NOTE — ED Notes (Signed)
MD Danford messaged about pt's BGL >400 at this time.

## 2021-06-28 NOTE — ED Notes (Signed)
Decaf coffee given w/ splenda.

## 2021-06-28 NOTE — ED Notes (Signed)
Fluids stopped per MD Danford verbal to stop fluids at 1500

## 2021-06-28 NOTE — TOC Initial Note (Signed)
Transition of Care [facility]) - Initial/Assessment Note    Patient Details  Name: Larry Callahan MRN: [id number] Date of Birth: 1956-05-07  Transition of Care [facility]) CM/SW Contact:    Shelbie Hutching, RN Phone Number: 06/28/2021, 2:31 PM  Clinical Narrative:                 High risk readmission screening completed.  Patient is from home where he lives alone, sister lives in the same mobile home park.  Patient reports that he is independent at home and usually mows his own yard.  He cannot say weather or not he has oxygen at home he just says he has a big machine in the corner and that he has a Cpap- even when asked in several different ways he cannot answer.  Patient does not drive and depends on Medical transportation, sounds like with [facility] but patient cannot confirm. He reports that he has had home health in the past but he cannot say whether he would want it again.  Patient would benefit from home health services at discharge he had [facility] back in September.  Merleen Nicely with [facility] accepted Home Health referral for RN, PT, and OT.    Expected Discharge Plan: [place] Barriers to Discharge: Continued Medical Work up   Patient Goals and CMS Choice Patient states their goals for this hospitalization and ongoing recovery are:: Patient wants to get well CMS Medicare.gov Compare Post Acute Care list provided to:: Patient Choice offered to / list presented to : Patient  Expected Discharge Plan and Services Expected Discharge Plan: [place]   Discharge Planning Services: CM Consult Post Acute Care Choice: [place] arrangements for the past 2 months: Mobile Home                 DME Arranged: N/A DME Agency: NA       HH Arranged: RN, PT, OT          Prior Living Arrangements/Services Living arrangements for the past 2 months: Mobile Home Lives with:: Self Patient language and need for interpreter reviewed:: Yes Do you feel  safe going back to the place where you live?: Yes      Need for Family Participation in Patient Care: Yes (Comment) Care giver support system in place?: Yes (comment) (sister) Current home services: DME (oxygen, cpap, walker, cane) Criminal Activity/Legal Involvement Pertinent to Current Situation/Hospitalization: No - Comment as needed  Activities of Daily Living      Permission Sought/Granted Permission sought to share information with : Case Manager, Family Supports Permission granted to share information with : Yes, Verbal Permission Granted  Share Information with NAME: [name]  Permission granted to share info w AGENCY: [place] granted to share info w Relationship: sister  Permission granted to share info w Contact Information: [phone]  Emotional Assessment Appearance:: Appears older than stated age Attitude/Demeanor/Rapport: Self-Absorbed Affect (typically observed): Other (comment) Orientation: : Oriented to Self, Oriented to Place, Oriented to  Time, Oriented to Situation Alcohol / Substance Use: Not Applicable Psych Involvement: No (comment)  Admission diagnosis:  COPD exacerbation ([place]) [J44.1] Patient Active Problem List   Diagnosis Date Noted   COPD exacerbation ([place]) 06/28/2021   Acquired thrombophilia ([place]) 06/28/2021   Anemia in chronic kidney disease (CKD) 06/28/2021   PAD (peripheral artery disease) (HCC)    Cellulitis of right arm 03/02/2021   Chronic anticoagulation 03/02/2021   COPD with acute exacerbation (  [place]) 01/29/2021   Atrial flutter ([place]) 01/07/2021   Syncope and collapse 11/16/2020   Demand ischemia ([place])    Influenza A 10/30/2020   HLD (hyperlipidemia) 10/06/2020   History of CVA (cerebrovascular accident) 09/25/2020   Gastroesophageal reflux disease without esophagitis 04/19/2020   Pneumonia due to COVID-19 virus 01/30/2020   Elevated troponin I level 01/29/2020   Suspected COVID-19 virus infection 01/29/2020    Lightheadedness 01/29/2020   Hematemesis 01/09/2020   Hospital discharge follow-up 11/26/2019   Generalized weakness 08/08/2019   Obesity (BMI 30-39.9) 08/08/2019   Acute right hip pain 06/17/2019   Inability to ambulate due to hip 06/17/2019   CKD (chronic kidney disease), stage IV (HCC)    Hypervolemia    Type 2 diabetes mellitus with stage 3b chronic kidney disease, with long-term current use of insulin ([place])    Hypomagnesemia    Full code status 05/20/2019   Pleuritic chest pain    Multifocal pneumonia 05/19/2019   Acute kidney injury superimposed on CKD 3b ([place]) 05/09/2019   Hemoptysis 04/09/2019   Shortness of breath 03/26/2019   Uncontrolled type 2 diabetes mellitus with insulin therapy 03/12/2019   Acute on chronic heart failure with preserved ejection fraction (HFpEF) (HCC)    Stage 3b chronic kidney disease ([place])    Chronic systolic CHF (congestive heart failure) ([place]) 95/02/3266   Diastolic dysfunction 12/45/8099   Iron deficiency anemia 12/31/2018   Atopic dermatitis 11/24/2018   Screening for colon cancer 11/06/2017   Uncontrolled type 2 diabetes mellitus with hyperglycemia ([place]) 11/06/2017   Hidradenitis 06/09/2017   Atherosclerotic heart disease of native coronary artery without angina pectoris 06/09/2017   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   Obstructive sleep apnea, adult 06/09/2017   Morbid (severe) obesity due to excess calories ([place]) 06/09/2017   Cigarette nicotine dependence with nicotine-induced disorder 06/09/2017   Diabetic polyneuropathy associated with type 2 diabetes mellitus ([place]) 06/09/2017   Allergic rhinitis due to pollen 06/09/2017   Mixed hyperlipidemia 06/09/2017   COPD (chronic obstructive pulmonary disease) ([place]) 06/09/2017   Dyspnea on exertion 06/09/2017   Wheezing 06/09/2017   Dysuria 06/09/2017   Snoring 06/09/2017   Essential hypertension 06/09/2017   Personal history of other malignant neoplasm of  kidney 06/09/2017   Pain in right hip 06/09/2017   Impacted cerumen, bilateral 06/09/2017   Secondary hyperparathyroidism, not elsewhere classified ([place]) 06/09/2017   Type 2 diabetes mellitus with hyperglycemia ([place]) 06/09/2017   Tinea corporis 06/09/2017   PCP:  Lavera Guise, MD Pharmacy:   [place], [place] - [address] [place] [address] Phone: [phone] Fax: [phone]     Social Determinants of Health (SDOH) Interventions    Readmission Risk Interventions Readmission Risk Prevention Plan 06/28/2021 03/05/2021 02/01/2020  Transportation Screening Complete Complete Complete  Medication Review Press photographer) Complete Complete Complete  PCP or Specialist appointment within 3-5 days of discharge Complete Complete Complete  HRI or Home Care Consult Complete Complete Complete  SW Recovery Care/Counseling Consult Complete - Complete  SW Consult Not Complete Comments - - -  Palliative Care Screening Not Applicable Not Applicable Not Applicable  Skilled Nursing Facility Not Applicable Not Applicable Not Applicable  Some recent data might be hidden

## 2021-06-28 NOTE — H&P (Signed)
Home     [place]   PATIENT NAME: Larry Callahan    MR#:  [id number]  DATE OF BIRTH:  July 27, 1955  DATE OF ADMISSION:  06/27/2021  PRIMARY CARE PHYSICIAN: Lavera Guise, MD   Patient is coming from: Home  REQUESTING/REFERRING PHYSICIAN: Ward, Delice Bison, DO  CHIEF COMPLAINT:   Chief Complaint  Patient presents with   Chest Pain    HISTORY OF PRESENT ILLNESS:  Larry Callahan is a 66 y.o. Caucasian male with medical history significant for COPD, type 2 diabetes mellitus, hypertension, dyslipidemia, combined systolic and diastolic CHF GERD and stage III chronic kidney disease, who presented to the ER with acute onset of worsening dyspnea with associated cough productive of green sputum as well as wheezing all day yesterday.  He denies any fever but experienced chills.  He admits to right-sided chest pain felt as a sharp pain and graded 7/10 in severity.  It has been getting worse with deep breathing.  No nausea however he had vomiting yesterday and denies any diarrhea.  No bilious vomitus or hematemesis.  No dysuria, oliguria or hematuria or flank pain.  ED Course: Upon presentation to the emergency room blood pressure was 141/74 with otherwise normal vital signs.  Pulse symmetry dropped to 89% on room air and came up to 96% on 2 L of O2 by nasal cannula.  Labs revealed a CO2 of 20 and blood glucose of 158 with a BUN of 51 and creatinine 2.38.  CBC showed hemoglobin of 11.3 and hematocrit 34.4 close to previous levels.  Influenza antigens and COVID-19 PCR came back negative.    EKG as reviewed by me : EKG showed atrial flutter with variable AV block with a rate of 73 Imaging: 2 view chest x-ray showed no acute cardiopulmonary disease.  The patient was given 125 mg of IV Solu-Medrol, 2 DuoNeb's, 50 mcg of IV fentanyl and 4 mg of IV Zofran.  He will be admitted to a medical telemetry bed for further evaluation and management.   PAST MEDICAL HISTORY:   Past Medical History:   Diagnosis Date   Allergy    Chronic combined systolic (congestive) and diastolic (congestive) heart failure ([place])    a. 04/2019 Echo: EF 45-50%; b. 02/2019 Echo: EF 55-60%; c. 09/2020 Echo: EF 35-40%, glob HK. Nl RV size/fxn. Mild MR.   CKD (chronic kidney disease), stage III (HCC)    COPD (chronic obstructive pulmonary disease) ([place])    Coronary artery disease    a. 2011 Cath: nonobs dzs; b. 09/2020 MV: EF 38%. No signif ischemia. ? inf MI vs diaph attenuation. GI uptake artifact.   Diabetes mellitus without complication ([place])    GERD (gastroesophageal reflux disease)    Hyperlipidemia    Hypertension    Morbid obesity ([place])    NICM (nonischemic cardiomyopathy) ([place])    a. 04/2019 Echo: EF 45-50%; b. 02/2019 Echo: EF 55-60%; c. 09/2020 Echo: EF 35-40%, glob HK; d. 09/2020 MV: No ischemia. ? inf infarct vs attenuation.   OSA (obstructive sleep apnea)     PAST SURGICAL HISTORY:   Past Surgical History:  Procedure Laterality Date   BACK SURGERY     CARDIAC CATHETERIZATION     CHOLECYSTECTOMY     COLONOSCOPY WITH PROPOFOL N/A 04/12/2019   Procedure: COLONOSCOPY WITH PROPOFOL;  Surgeon: [name], MD;  [facility]COPY;  Service: Gastroenterology;  Laterality: N/A;   ESOPHAGOGASTRODUODENOSCOPY N/A 04/12/2019   Procedure: ESOPHAGOGASTRODUODENOSCOPY (EGD);  Surgeon: [name], MD;  Location: [name] ENDOSCOPY;  Service: Gastroenterology;  Laterality: N/A;   ESOPHAGOGASTRODUODENOSCOPY (EGD) WITH PROPOFOL N/A 11/21/2019   Procedure: ESOPHAGOGASTRODUODENOSCOPY (EGD) WITH PROPOFOL;  Surgeon: [name], MD;  Location[facility]COPY;  Service: Gastroenterology;  Laterality: N/A;   FLEXIBLE BRONCHOSCOPY Bilateral 05/17/2019   Procedure: FLEXIBLE BRONCHOSCOPY;  Surgeon: [name], MD;  Location: [name] ORS;  Service: Pulmonary;  Laterality: Bilateral;   left arm surgery     nephrectomy Left    PARATHYROIDECTOMY     RIGHT HEART CATH N/A  03/29/2019   Procedure: RIGHT HEART CATH;  Surgeon: [name], MD;  Location: [facility] CV LAB;  Service: Cardiovascular;  Laterality: N/A;    SOCIAL HISTORY:   Social History   Tobacco Use   Smoking status: Former    Types: Cigarettes   Smokeless tobacco: Former    Types: Chew  Substance Use Topics   Alcohol use: No    FAMILY HISTORY:   Family History  Problem Relation Age of Onset   Diabetes Mother        2003   Lung cancer Father    Diabetes Sister    Hypertension Sister    Heart disease Sister    Cancer Sister    Bone cancer Brother     DRUG ALLERGIES:   Allergies  Allergen Reactions   Penicillins Rash    REVIEW OF SYSTEMS:   ROS As per history of present illness. All pertinent systems were reviewed above. Constitutional, HEENT, cardiovascular, respiratory, GI, GU, musculoskeletal, neuro, psychiatric, endocrine, integumentary and hematologic systems were reviewed and are otherwise negative/unremarkable except for positive findings mentioned above in the HPI.   MEDICATIONS AT HOME:   Prior to Admission medications   Medication Sig Start Date End Date Taking? Authorizing Provider  albuterol (VENTOLIN HFA) 108 (90 Base) MCG/ACT inhaler Inhale 2 puffs into the lungs every 6 (six) hours as needed for wheezing or shortness of breath. 08/31/20   Lavera Guise, MD  amLODipine (NORVASC) 10 MG tablet Take 1 tablet (10 mg total) by mouth daily. 03/23/21   [name], NP  apixaban (ELIQUIS) 2.5 MG TABS tablet Take 1 tablet (2.5 mg total) by mouth 2 (two) times daily. 05/20/21   [name], NP  Blood Glucose Calibration (TRUE METRIX LEVEL 1) Low SOLN Use as directed 04/04/18   [name], NP  Blood Glucose Monitoring Suppl (TRUE METRIX AIR GLUCOSE METER) DEVI 1 Device by Does not apply route 2 (two) times daily. 04/13/18   [name], NP  Budeson-Glycopyrrol-Formoterol (BREZTRI AEROSPHERE) 160-9-4.8 MCG/ACT AERO Inhale 2  puffs into the lungs in the morning and at bedtime. 02/23/21   [name], NP  carvedilol (COREG) 12.5 MG tablet Take 1 tablet (12.5 mg total) by mouth 2 (two) times daily. 01/07/21 01/07/22  Arta Silence, MD  cetirizine (ZYRTEC) 10 MG tablet Take 10 mg by mouth daily.    [provider]  clindamycin (CLINDAGEL) 1 % gel Apply to the affected area two times daily until resolved. Then use as needed for flare ups 05/20/21   [name], NP  escitalopram (LEXAPRO) 10 MG tablet Take one tab po qd for anxiety with food 02/23/21   [name], NP  furosemide (LASIX) 40 MG tablet Take one to tabs once or twice days for fluid // swelling 02/23/21   [name], NP  gabapentin (NEURONTIN) 100 MG capsule Take 1 capsule (100 mg total) by mouth in the morning, at noon, and at bedtime. 02/23/21  [name], NP  glimepiride (AMARYL) 2 MG tablet Take one tab po qd with supper 02/23/21   [name], NP  glucose blood (TRUE METRIX BLOOD GLUCOSE TEST) test strip Use as instructed twice daily diag E11.65 07/30/19   [name], NP  hydrALAZINE (APRESOLINE) 100 MG tablet Take 1 tablet (100 mg total) by mouth 3 (three) times daily. 02/23/21   [name], NP  insulin glargine, 1 Unit Dial, (TOUJEO) 300 UNIT/ML Solostar Pen Inject 10 Units into the skin daily. Reduced from prior 26 units daily. 03/07/21   Enzo Bi, MD  ipratropium-albuterol (DUONEB) 0.5-2.5 (3) MG/3ML SOLN Take 3 mLs by nebulization every 6 (six) hours as needed. 10/11/20   Lavera Guise, MD  isosorbide mononitrate (IMDUR) 30 MG 24 hr tablet Take 1 tablet (30 mg total) by mouth 2 (two) times daily. 10/08/20   Jennye Boroughs, MD  meclizine (ANTIVERT) 25 MG tablet Take 1 tablet (25 mg total) by mouth 3 (three) times daily as needed for dizziness or nausea. 11/25/20   Paulette Blanch, MD  NOVOFINE PEN NEEDLE 32G X 6 MM MISC Use as directed 10/15/20   Lavera Guise, MD  omeprazole (PRILOSEC) 40 MG capsule Take 1  capsule (40 mg total) by mouth in the morning and at bedtime. 05/12/21   [name], NP  potassium chloride SA (KLOR-CON) 20 MEQ tablet Take 1 tablet (20 mEq total) by mouth 2 (two) times daily as needed. When you take Lasix. 03/07/21   Enzo Bi, MD  rosuvastatin (CRESTOR) 10 MG tablet Take 1 tablet (10 mg total) by mouth at bedtime. 02/23/21   [name], NP  TRUEplus Lancets 33G MISC Use as directed twice daily diag E11.65 07/30/19   [name], NP  vitamin B-12 1000 MCG tablet Take 1 tablet (1,000 mcg total) by mouth daily. 01/16/21   Fritzi Mandes, MD      VITAL SIGNS:  Blood pressure (!) 150/92, pulse 95, temperature 98.4 F (36.9 C), temperature source Oral, resp. rate (!) 21, height _0  (1.803 m), weight 122.5 kg, SpO2 96 %.  PHYSICAL EXAMINATION:  Physical Exam  GENERAL:  66 y.o.-year-old Caucasian male patient lying in the bed with no acute distress.  EYES: Pupils equal, round, reactive to light and accommodation. No scleral icterus. Extraocular muscles intact.  HEENT: Head atraumatic, normocephalic. Oropharynx and nasopharynx clear.  NECK:  Supple, no jugular venous distention. No thyroid enlargement, no tenderness.  LUNGS: Diffuse expiratory wheezes with tight expiratory airflow and harsh vesicular breathing. CARDIOVASCULAR: Regular rate and rhythm, S1, S2 normal. No murmurs, rubs, or gallops.  He has reproducible right-sided chest wall tenderness. ABDOMEN: Soft, nondistended, nontender. Bowel sounds present. No organomegaly or mass.  EXTREMITIES: No pedal edema, cyanosis, or clubbing.  NEUROLOGIC: Cranial nerves II through XII are intact. Muscle strength 5/5 in all extremities. Sensation intact. Gait not checked.  PSYCHIATRIC: The patient is alert and oriented x 3.  Normal affect and good eye contact. SKIN: No obvious rash, lesion, or ulcer.   LABORATORY PANEL:   CBC Recent Labs  Lab 06/27/21 1453  WBC 5.6  HGB 11.6*  HCT 36.7*  PLT 203    ------------------------------------------------------------------------------------------------------------------  Chemistries  Recent Labs  Lab 06/27/21 1453  NA 137  K 3.8  CL 108  CO2 20*  GLUCOSE 158*  BUN 51*  CREATININE 2.38*  CALCIUM 9.1   ------------------------------------------------------------------------------------------------------------------  Cardiac Enzymes No results for input(s): TROPONINI in the last 168 hours. ------------------------------------------------------------------------------------------------------------------  RADIOLOGY:  DG  Chest 2 View  Result Date: 06/27/2021 CLINICAL DATA:  Chest pain and nausea vomiting since this morning. EXAM: CHEST - 2 VIEW COMPARISON:  06/11/2021 and older studies. FINDINGS: Cardiac silhouette mildly enlarged.  No mediastinal or hilar masses. Prominent vascular markings similar to the prior study. No lung consolidation. No convincing edema. No pleural effusion or pneumothorax. Skeletal structures are grossly intact. IMPRESSION: No acute cardiopulmonary disease. Electronically Signed   By: Lajean Manes M.D.   On: 06/27/2021 15:27      IMPRESSION AND PLAN:  Principal Problem:   COPD exacerbation ([place])  1.  COPD acute exacerbation likely secondary to acute bronchitis.  The patient has a right-sided chest pain likely atypical pleuritic pain.  D-dimer came back elevated. - We will admit the patient to a medical telemetry bed. - We will continue steroid therapy with IV Solu-Medrol. - We will continue bronchodilator therapy with DuoNebs 4 times daily and every 4 hours as needed. - We will place on IV Rocephin given severity of COPD exacerbation and sputum production. - We will follow VQ scan that was ordered by the ER doctor.  2.  Acute hypoxic respiratory failure due to #1. - O2 protocol will be followed.  3.  Essential hypertension. - We will continue his amlodipine and Coreg.  4.  Atrial flutter with  controlled ventricular response.. - We will continue his Eliquis and Coreg.  5.  Type 2 diabetes mellitus. - We will place him on supplement coverage with NovoLog and continue basal coverage.  6.  Dyslipidemia. - We will continue statin therapy.  7.  GERD. - We will continue PPI therapy.  8.  Coronary artery disease. - We will continue his Coreg, statin therapy and Imdur.  DVT prophylaxis: Eliquis. Code Status: full code. Family Communication:  The plan of care was discussed in details with the patient (and family). I answered all questions. The patient agreed to proceed with the above mentioned plan. Further management will depend upon hospital course. Disposition Plan: Back to previous home environment Consults called: none. All the records are reviewed and case discussed with ED provider.  Status is: Inpatient  me of the admission, it appears that the appropriate admission status for this patient is inpatient.  This is judged to be reasonable and necessary in order to provide the required intensity of service to ensure the patient's safety given the presenting symptoms, physical exam findings and initial radiographic and laboratory data in the context of comorbid conditions.  The patient requires inpatient status due to high intensity of service, high risk of further deterioration and high frequency of surveillance required.  I certify that at the time of admission, it is my clinical judgment that the patient will require inpatient hospital care extending more than 2 midnights.                            Dispo: The patient is from: Home              Anticipated d/c is to: Home              Patient currently is not medically stable to d/c.              Difficult to place patient: No  Christel Mormon M.D on 06/28/2021 at 2:41 AM  [facility]   From 7 PM-7 AM, contact night-coverage [email or web address]  CC: Primary care physician; Lavera Guise, MD

## 2021-06-28 NOTE — Assessment & Plan Note (Signed)
-   CPAP at night °

## 2021-06-28 NOTE — Progress Notes (Signed)
°  Progress Note   Patient: Larry Callahan TKW:[id number] DOB: 04/09/1956 DOA: 06/27/2021     0 DOS: the patient was seen and examined on 06/28/2021   Brief hospital course: Larry Callahan is a 66 y.o. M with COPD, DM, HTN, sCHF, CKD IV baseline Cr 1.8-2.4, who presented with 1 day new dyspnea, productive cough and wheezing with pleuritic chest pain.  In the ER, SpO2 89%, dyspneic.  Cr 2.4, CXR clear, dimer elevated.  Started on steroids and admitted for COPD flare.  Assessment and Plan * COPD exacerbation ([place])- (present on admission) -Continue steroids, bronchodilators, ICS/LABA  Chest pain- (present on admission) VQ negative. CXR clear.  Likely musculoskeletal.  Anemia in chronic kidney disease (CKD)- (present on admission)    Acquired thrombophilia ([place])- (present on admission)    PAD (peripheral artery disease) ([place])- (present on admission)    CKD (chronic kidney disease), stage IV ([place])- (present on admission)    Chronic systolic CHF (congestive heart failure) ([place])- (present on admission)    Uncontrolled type 2 diabetes mellitus with hyperglycemia ([place])- (present on admission) - Increase Lantus - Increase SS corrections - Add mealtime - Add back home glimepiride  Essential hypertension- (present on admission)    COPD (chronic obstructive pulmonary disease) ([place])- (present on admission)    Morbid (severe) obesity due to excess calories ([place])- (present on admission)    Obstructive sleep apnea, adult- (present on admission) - CPAP at night       This is a no charge note.  For further details please see H&P by Drr. Mansy from earlier today.  VQ negative.  Patient seen and examined.  Looks benign.  Mild COPD flare.  Main problem is hyperglycemia.  Author: Edwin Dada, MD 06/28/2021 3:31 PM  For on call review [email or web address].

## 2021-06-28 NOTE — Assessment & Plan Note (Signed)
-  Continue steroids, bronchodilators, ICS/LABA

## 2021-06-28 NOTE — Progress Notes (Signed)
Inpatient Diabetes Program Recommendations  AACE/ADA: New Consensus Statement on Inpatient Glycemic Control (2015)  Target Ranges:  Prepandial:   less than 140 mg/dL      Peak postprandial:   less than 180 mg/dL (1-2 hours)      Critically ill patients:  140 - 180 mg/dL    Latest Reference Range & Units 03/23/21 11:13  Hemoglobin A1C 4.0 - 5.6 % 6.8 !    Latest Reference Range & Units 06/28/21 06:35  Glucose 70 - 99 mg/dL 334 (H)  (H): Data is abnormally high   Admit with: COPD acute exacerbation likely secondary to acute bronchitis  History: DM, CHF, CKD  Home DM Meds: Amaryl 2 mg daily       Toujeo 10 units daily  Current Orders: Semglee 10 units Daily     MD- Note patient getting Solumedrol 40 mg BID--Will start Prednisone tomorrow AM.  To Start Semglee insulin this AM.  No orders for Novolog SSI this AM  Please start Novolog Moderate Correction Scale/ SSI (0-15 units) TID AC + HS    --Will follow patient during hospitalization--  Wyn Quaker RN, MSN, CDE Diabetes Coordinator Inpatient Glycemic Control Team Team Pager: [phone] (8a-5p)

## 2021-06-29 LAB — GLUCOSE, CAPILLARY
Glucose-Capillary: 221 mg/dL — ABNORMAL HIGH (ref 70–99)
Glucose-Capillary: 422 mg/dL — ABNORMAL HIGH (ref 70–99)
Glucose-Capillary: 207 mg/dL — ABNORMAL HIGH (ref 70–99)

## 2021-06-29 LAB — CREATININE, SERUM
Creatinine, Ser: 2.68 mg/dL — ABNORMAL HIGH (ref 0.61–1.24)
GFR, Estimated: 25 mL/min — ABNORMAL LOW (ref >60)

## 2021-06-29 MED ORDER — INSULIN GLARGINE-YFGN 100 UNIT/ML ~~LOC~~ SOLN
20.0000 [IU] | Freq: Every day | SUBCUTANEOUS | Status: DC
  Administered 2021-06-29: 20 [IU] via SUBCUTANEOUS
  Filled 2021-06-29 (×2): qty 0.2

## 2021-06-29 MED ORDER — GLIMEPIRIDE 2 MG PO TABS
2.0000 mg | ORAL_TABLET | Freq: Every day | ORAL | Status: DC
  Filled 2021-06-29 (×2): qty 1

## 2021-06-29 MED ORDER — BUDESONIDE 0.25 MG/2ML IN SUSP
0.2500 mg | Freq: Every day | RESPIRATORY_TRACT | 0 refills | Status: DC
Start: 2021-06-29 — End: 2021-08-08

## 2021-06-29 MED ORDER — IPRATROPIUM-ALBUTEROL 0.5-2.5 (3) MG/3ML IN SOLN: 3.0000 mL | Freq: Four times a day (QID) | RESPIRATORY_TRACT | 3 refills | Status: DC | PRN

## 2021-06-29 MED ORDER — INSULIN ASPART 100 UNIT/ML IJ SOLN
20.0000 [IU] | Freq: Once | INTRAMUSCULAR | Status: AC
  Administered 2021-06-29: 20 [IU] via SUBCUTANEOUS
  Filled 2021-06-29: qty 1

## 2021-06-29 NOTE — Discharge Summary (Signed)
Physician Discharge Summary   Patient: Larry Callahan MRN: [id number] DOB: [birth year]  Admit date:     06/27/2021  Discharge date: 06/29/21  Discharge Physician: Edwin Dada   PCP: Lavera Guise, MD   Recommendations at discharge:   Follow up with Dr. Humphrey Rolls in 1 week Dr. Humphrey Rolls: Please follow renal function in 1 week; if stable in high 2s, please refer to nephrology Dr. Humphrey Rolls: Sister concerned that patient's breathing is worse since consolidating inhalers as Breztri      Discharge Diagnoses Principal Problem:   COPD exacerbation ([place]) Active Problems:   Chest pain   Obstructive sleep apnea, adult   Morbid (severe) obesity due to excess calories ([place])   COPD (chronic obstructive pulmonary disease) ([place])   Essential hypertension   Uncontrolled type 2 diabetes mellitus with hyperglycemia (HCC)   Chronic systolic CHF (congestive heart failure) (HCC)   CKD (chronic kidney disease), stage IV (HCC)   PAD (peripheral artery disease) ([place])   Acquired thrombophilia ([place])   Anemia in chronic kidney disease (CKD)       Hospital Course   Mr. Maxson is a 66 y.o. M with COPD, DM, HTN, sCHF, CKD IV baseline Cr 1.8-2.4, who presented with 1 day new dyspnea, productive cough and wheezing with pleuritic chest pain.  In the ER, SpO2 89%, dyspneic.  Cr 2.4, CXR clear, dimer elevated.  Started on steroids and admitted for COPD flare.     * COPD exacerbation ([place])- (present on admission) Patient started on systemic steroids and had marked hyperglycemia.  On day of discharge, his dyspnea was slightly worse than recent baseline but he was able to ambulate to nursing station independently and without desaturation on room air.  Given hyperglycemia, discharged with Pulmicort nebulizer for 7 days and close PCP follow up.    CKD (chronic kidney disease), stage IV ([place])- (present on admission) Patient Cr 2.5 on admission, given fluids without improvement, Cr remained stable  in 2.5-2.7 range.  On retrospect, patient's Cr has been in the 2-2.5 range for much of the last year.    Here, he did not seem dehydrated, and his urinalysis showed protein but no inflammatory sediment.  I suspect he has worsening CKD to stage IV.  Likely this will trigger CHF soon, and his prognosis is worsening.        Chest pain- (present on admission) VQ negative. CXR clear.    On further questioning, patient is indicating pain from his hidradenitis under his arm, which appeared benign.  Encouraged to follow up with his dermatologist.    Anemia in chronic kidney disease (CKD)- (present on admission)  Acquired thrombophilia ([place])- (present on admission)  PAD (peripheral artery disease) ([place])- (present on admission)   Chronic systolic CHF (congestive heart failure) ([place])- (present on admission) BNP normal.  CXR without edema.  No significant LE edema. Did not appear to be in failure.   Uncontrolled type 2 diabetes mellitus with hyperglycemia ([place])- (present on admission)  Essential hypertension- (present on admission)   COPD (chronic obstructive pulmonary disease) ([place])- (present on admission)   Morbid (severe) obesity due to excess calories ([place])- (present on admission)   Obstructive sleep apnea, adult- (present on admission)              Disposition: Home Diet recommendation: Cardiac and Carb modified diet  DISCHARGE MEDICATION: Allergies as of 06/29/2021       Reactions   Penicillins Rash        Medication List  STOP taking these medications    albuterol 108 (90 Base) MCG/ACT inhaler Commonly known as: VENTOLIN HFA   isosorbide mononitrate 30 MG 24 hr tablet Commonly known as: IMDUR       TAKE these medications    amLODipine 10 MG tablet Commonly known as: NORVASC Take 1 tablet (10 mg total) by mouth daily.   apixaban 2.5 MG Tabs tablet Commonly known as: ELIQUIS Take 1 tablet (2.5 mg total) by mouth 2 (two) times daily.    Breztri Aerosphere 160-9-4.8 MCG/ACT Aero Generic drug: Budeson-Glycopyrrol-Formoterol Inhale 2 puffs into the lungs in the morning and at bedtime.   budesonide 0.25 MG/2ML nebulizer solution Commonly known as: PULMICORT Take 2 mLs (0.25 mg total) by nebulization daily for 7 days.   carvedilol 12.5 MG tablet Commonly known as: Coreg Take 1 tablet (12.5 mg total) by mouth 2 (two) times daily.   cetirizine 10 MG tablet Commonly known as: ZYRTEC Take 10 mg by mouth daily.   clindamycin 1 % gel Commonly known as: Clindagel Apply to the affected area two times daily until resolved. Then use as needed for flare ups   cyanocobalamin 1000 MCG tablet Take 1 tablet (1,000 mcg total) by mouth daily.   furosemide 40 MG tablet Commonly known as: LASIX Take one to tabs once or twice days for fluid // swelling What changed:  how much to take how to take this when to take this reasons to take this additional instructions   gabapentin 100 MG capsule Commonly known as: NEURONTIN Take 1 capsule (100 mg total) by mouth in the morning, at noon, and at bedtime.   glimepiride 2 MG tablet Commonly known as: AMARYL Take one tab po qd with supper   hydrALAZINE 100 MG tablet Commonly known as: APRESOLINE Take 1 tablet (100 mg total) by mouth 3 (three) times daily.   insulin glargine (1 Unit Dial) 300 UNIT/ML Solostar Pen Commonly known as: TOUJEO Inject 10 Units into the skin daily. Reduced from prior 26 units daily.   ipratropium-albuterol 0.5-2.5 (3) MG/3ML Soln Commonly known as: DUONEB Take 3 mLs by nebulization every 6 (six) hours as needed.   meclizine 25 MG tablet Commonly known as: ANTIVERT Take 1 tablet (25 mg total) by mouth 3 (three) times daily as needed for dizziness or nausea.   Novofine Pen Needle 32G X 6 MM Misc Generic drug: Insulin Pen Needle Use as directed   omeprazole 40 MG capsule Commonly known as: PRILOSEC Take 1 capsule (40 mg total) by mouth in the  morning and at bedtime.   potassium chloride SA 20 MEQ tablet Commonly known as: KLOR-CON M Take 1 tablet (20 mEq total) by mouth 2 (two) times daily as needed. When you take Lasix.   rosuvastatin 10 MG tablet Commonly known as: CRESTOR Take 1 tablet (10 mg total) by mouth at bedtime.   True Metrix Air Glucose Meter Devi 1 Device by Does not apply route 2 (two) times daily.   True Metrix Blood Glucose Test test strip Generic drug: glucose blood Use as instructed twice daily diag E11.65   True Metrix Level 1 Low Soln Use as directed   TRUEplus Lancets 33G Misc Use as directed twice daily diag E11.65               Durable Medical Equipment  (From admission, onward)           Start     Ordered   06/29/21 1156  For home use only DME Nebulizer  machine  Once       Question Answer Comment  Patient needs a nebulizer to treat with the following condition COPD (chronic obstructive pulmonary disease) ([place])   Length of Need Lifetime      06/29/21 1156            Follow-up Information     Lavera Guise, MD. Schedule an appointment as soon as possible for a visit in 1 week(s).   Specialties: Internal Medicine, Cardiology Contact information: [place] [address] [phone]                Discharge Instructions     Discharge instructions   Complete by: As directed    You were admitted for a COPD flare. It was very mild.  To treat it, use the two breathing treatments in your nebulizer: Use albuterol-ipratropium (Duo-neb) three times daily for the next week Use budesonide (Pulmicort) once daily for the next week  After 1 week: Use the albuterol-ipratropium only as needed Stop the Pulmicort   Go see Dr. Humphrey Rolls in 1 week   The spot under your armpit looks good.  It is called "hidradenitis" Call Dr. Alveria Apley office for a follow up appointment to manage your pain from this   Increase activity slowly   Complete by: As directed          Discharge Exam: Filed Weights   06/27/21 1451  Weight: 122.5 kg   General: Pt is alert, awake, not in acute distress, ambulating in the hall Cardiovascular: RRR, nl S1-S2, no murmurs appreciated.   Brawny venous change, but no new pitting LE edema.   Respiratory: Normal respiratory rate and rhythm, slightly out of breath with exertion.  CTAB without rales or wheezes. Abdominal: Abdomen soft and non-tender.  No distension or HSM.   Neuro/Psych: Strength symmetric in upper and lower extremities.  Judgment and insight appear normal.   Condition at discharge: fair  The results of significant diagnostics from this hospitalization (including imaging, microbiology, ancillary and laboratory) are listed below for reference.   Imaging Studies: DG Chest 2 View  Result Date: 06/27/2021 CLINICAL DATA:  Chest pain and nausea vomiting since this morning. EXAM: CHEST - 2 VIEW COMPARISON:  06/11/2021 and older studies. FINDINGS: Cardiac silhouette mildly enlarged.  No mediastinal or hilar masses. Prominent vascular markings similar to the prior study. No lung consolidation. No convincing edema. No pleural effusion or pneumothorax. Skeletal structures are grossly intact. IMPRESSION: No acute cardiopulmonary disease. Electronically Signed   By: Lajean Manes M.D.   On: 06/27/2021 15:27   NM Pulmonary Perfusion  Result Date: 06/28/2021 CLINICAL DATA:  PE suspected, positive D-dimer EXAM: NUCLEAR MEDICINE PERFUSION LUNG SCAN TECHNIQUE: Perfusion images were obtained in multiple projections after intravenous injection of radiopharmaceutical. Ventilation scans intentionally deferred if perfusion scan and chest x-ray adequate for interpretation during COVID 19 epidemic. RADIOPHARMACEUTICALS:  4.48 mCi Tc-4m MAA IV COMPARISON:  Chest radiograph, 06/27/2021 FINDINGS: Normal, homogeneous pulmonary perfusion. No suspicious filling defects. Cardiomegaly. IMPRESSION: 1. Very low probability of pulmonary  embolism by modified perfusion only PIOPED criteria (PE absent). 2.  Cardiomegaly. Electronically Signed   By: Delanna Ahmadi M.D.   On: 06/28/2021 10:58   DG Chest Portable 1 View  Result Date: 06/11/2021 CLINICAL DATA:  Chest pain. EXAM: PORTABLE CHEST 1 VIEW COMPARISON:  Chest x-ray dated March 02, 2021. FINDINGS: Unchanged cardiomegaly and mild pulmonary vascular congestion. No focal consolidation, pleural effusion, or pneumothorax. No acute osseous abnormality. IMPRESSION: 1. Chronic  cardiomegaly and mild pulmonary vascular congestion. No new acute cardiopulmonary process. Electronically Signed   By: Titus Dubin M.D.   On: 06/11/2021 15:04    Microbiology: Results for orders placed or performed during the hospital encounter of 06/27/21  Resp Panel by RT-PCR (Flu A&B, Covid) Nasopharyngeal Swab     Status: None   Collection Time: 06/27/21 11:55 PM   Specimen: Nasopharyngeal Swab; Nasopharyngeal(NP) swabs in vial transport medium  Result Value Ref Range Status   SARS Coronavirus 2 by RT PCR NEGATIVE NEGATIVE Final    Comment: (NOTE) SARS-CoV-2 target nucleic acids are NOT DETECTED.  The SARS-CoV-2 RNA is generally detectable in upper respiratory specimens during the acute phase of infection. The lowest concentration of SARS-CoV-2 viral copies this assay can detect is 138 copies/mL. A negative result does not preclude SARS-Cov-2 infection and should not be used as the sole basis for treatment or other patient management decisions. A negative result may occur with  improper specimen collection/handling, submission of specimen other than nasopharyngeal swab, presence of viral mutation(s) within the areas targeted by this assay, and inadequate number of viral copies(<138 copies/mL). A negative result must be combined with clinical observations, patient history, and epidemiological information. The expected result is Negative.  Fact Sheet for Patients:   EntrepreneurPulse.com.au  Fact Sheet for Healthcare Providers:  IncredibleEmployment.be  This test is no t yet approved or cleared by the [place] FDA and  has been authorized for detection and/or diagnosis of SARS-CoV-2 by FDA under an Emergency Use Authorization (EUA). This EUA will remain  in effect (meaning this test can be used) for the duration of the COVID-19 declaration under Section 564(b)(1) of the Act, 21 U.S.C.section 360bbb-3(b)(1), unless the authorization is terminated  or revoked sooner.       Influenza A by PCR NEGATIVE NEGATIVE Final   Influenza B by PCR NEGATIVE NEGATIVE Final    Comment: (NOTE) The Xpert Xpress SARS-CoV-2/FLU/RSV plus assay is intended as an aid in the diagnosis of influenza from Nasopharyngeal swab specimens and should not be used as a sole basis for treatment. Nasal washings and aspirates are unacceptable for Xpert Xpress SARS-CoV-2/FLU/RSV testing.  Fact Sheet for Patients: EntrepreneurPulse.com.au  Fact Sheet for Healthcare Providers: IncredibleEmployment.be  This test is not yet approved or cleared by the [place] FDA and has been authorized for detection and/or diagnosis of SARS-CoV-2 by FDA under an Emergency Use Authorization (EUA). This EUA will remain in effect (meaning this test can be used) for the duration of the COVID-19 declaration under Section 564(b)(1) of the Act, 21 U.S.C. section 360bbb-3(b)(1), unless the authorization is terminated or revoked.  Performed at [facility], [address] [place], [place] [address]     Labs: CBC: Recent Labs  Lab 06/27/21 1453 06/28/21 0236 06/28/21 0635  WBC 5.6 7.5 5.9  HGB 11.6* 11.3* 11.2*  HCT 36.7* 34.4* 34.9*  MCV 81.4 79.8* 79.7*  PLT 203 194 993   Basic Metabolic Panel: Recent Labs  Lab 06/27/21 1453 06/28/21 0236 06/28/21 0635 06/29/21 0913  NA 137  --  134*  --    K 3.8  --  4.2  --   CL 108  --  105  --   CO2 20*  --  20*  --   GLUCOSE 158*  --  334*  --   BUN 51*  --  55*  --   CREATININE 2.38* 2.52* 2.59* 2.68*  CALCIUM 9.1  --  8.7*  --  Liver Function Tests: Recent Labs  Lab 06/27/21 0001  AST 21  ALT 19  ALKPHOS 75  BILITOT 1.2  PROT 7.6  ALBUMIN 3.2*   CBG: Recent Labs  Lab 06/28/21 1636 06/28/21 2123 06/29/21 0746 06/29/21 1218 06/29/21 1519  GLUCAP 349* 251* 221* 422* 207*    Discharge time spent: 45 minutes.  Signed: Edwin Dada, MD [facility] 06/29/2021

## 2021-06-29 NOTE — TOC Transition Note (Signed)
Transition of Care [facility]) - CM/SW Discharge Note   Patient Details  Name: Larry Callahan MRN: [id number] Date of Birth: [birth year]  [facility] CM/SW Contact:  Beverly Sessions, RN Phone Number: 06/29/2021, 2:38 PM   Clinical Narrative:     Patient to discharge today.  Per MD not indicated to pursue trilogy at this time Nebulizer delivered to bedside by [employer]. Merleen Nicely with [facility] notified of discharge  Per patient Niece will transport at discharge   Final next level of care: [place] Barriers to Discharge: Barriers Resolved   Patient Goals and CMS Choice Patient states their goals for this hospitalization and ongoing recovery are:: Patient wants to get well CMS Medicare.gov Compare Post Acute Care list provided to:: Patient Choice offered to / list presented to : Patient  Discharge Placement                       Discharge Plan and Services   Discharge Planning Services: CM Consult Post Acute Care Choice: Home Health          DME Arranged: Nebulizer machine DME Agency: [employer] Date DME Agency Contacted: 06/29/21   Representative spoke with at DME Agency: [place]: RN, PT, OT [facility] Agency: [facility] Date [place]: 06/29/21   Representative spoke with at [place]: [place] ([place]) Interventions     Readmission Risk Interventions Readmission Risk Prevention Plan 06/28/2021 03/05/2021 02/01/2020  Transportation Screening Complete Complete Complete  Medication Review Press photographer) Complete Complete Complete  PCP or Specialist appointment within 3-5 days of discharge Complete Complete Complete  HRI or Home Care Consult Complete Complete Complete  SW Recovery Care/Counseling Consult Complete - Complete  SW Consult Not Complete Comments - - -  Palliative Care Screening Not Applicable Not Applicable Not Colon Not Applicable Not  Applicable Not Applicable  Some recent data might be hidden

## 2021-06-30 ENCOUNTER — Telehealth: Payer: Self-pay

## 2021-06-30 NOTE — Telephone Encounter (Signed)
Lvm to schedule hospital follow up-[name] ?

## 2021-07-01 ENCOUNTER — Telehealth: Payer: Self-pay | Admitting: Student-PharmD

## 2021-07-01 ENCOUNTER — Telehealth: Payer: Self-pay

## 2021-07-01 IMAGING — CR DG CHEST 2V
3 series · 3 of 3 positions shown · non-contrast
Comparison: November 10, 2019

CLINICAL DATA: Chest pain and nausea.

EXAM:
CHEST - 2 VIEW

[chest lat (1 of 2)]
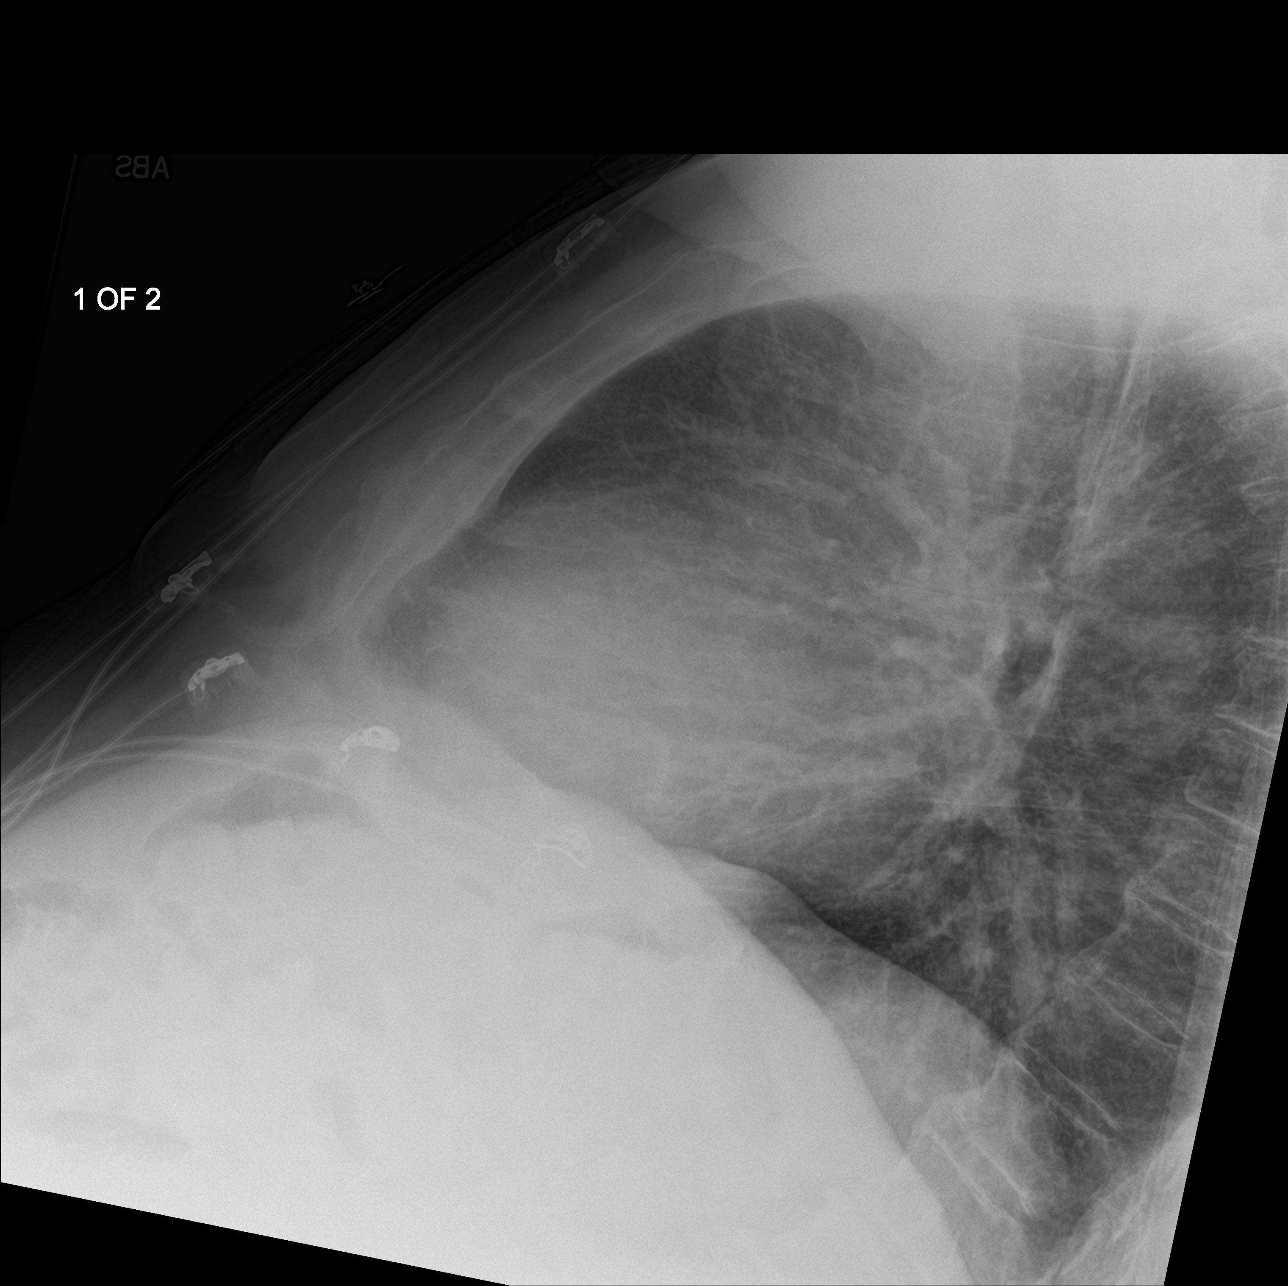

[chest ap]
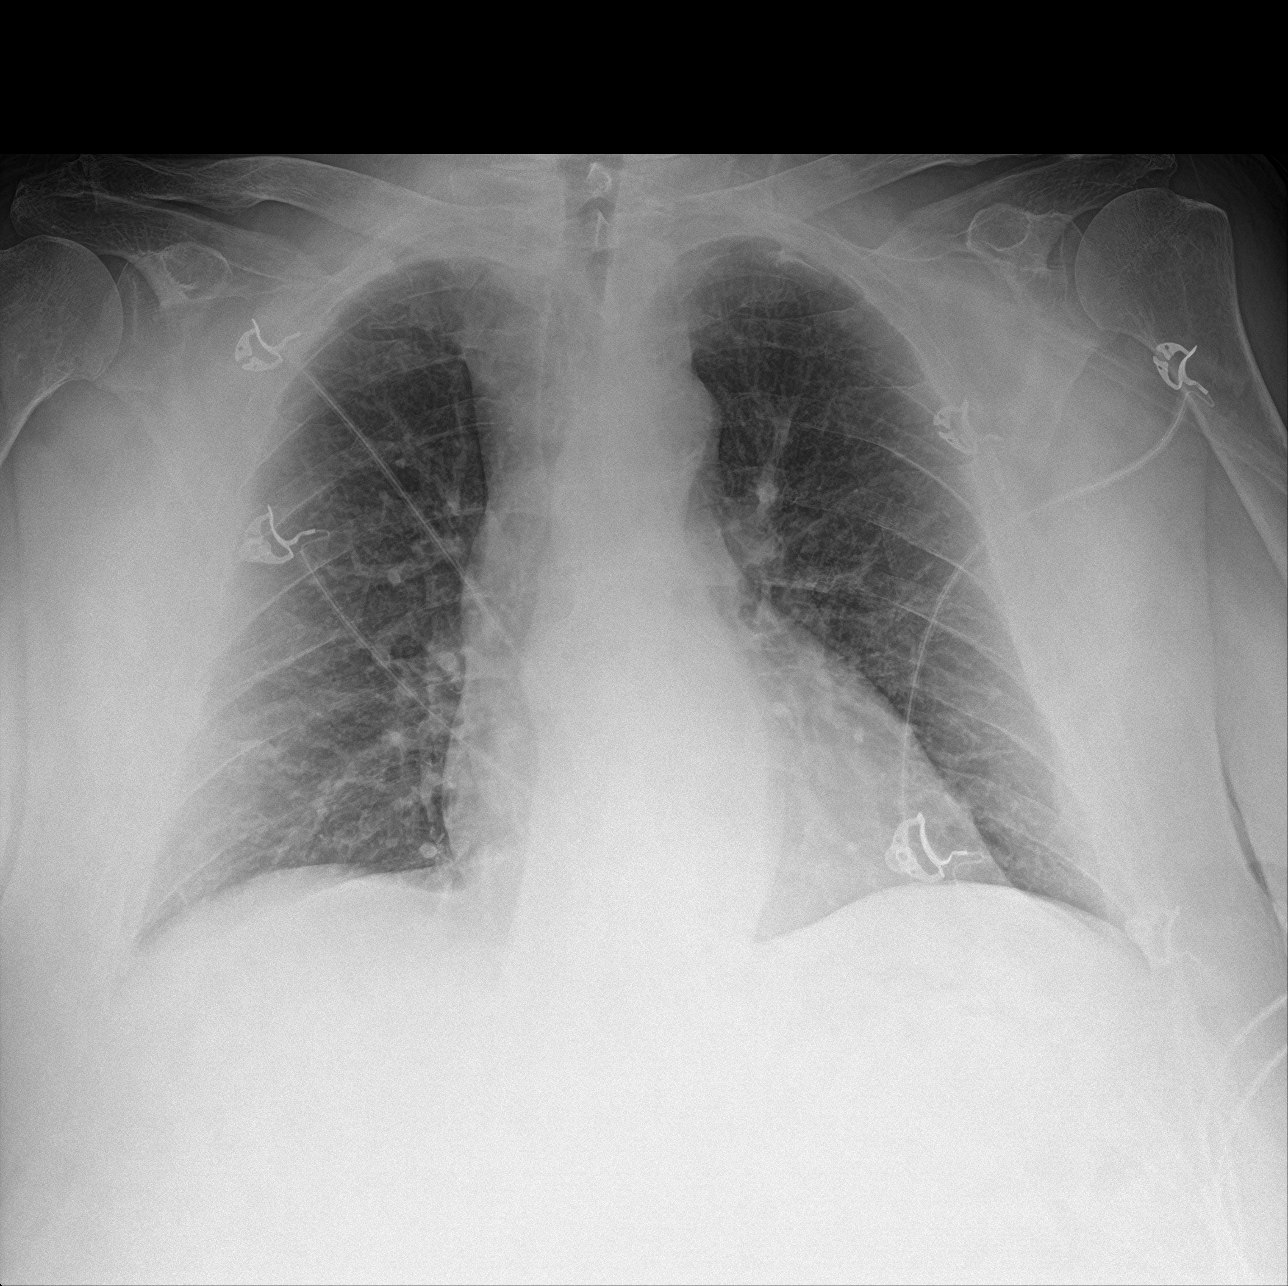

[chest lat (2 of 2)]
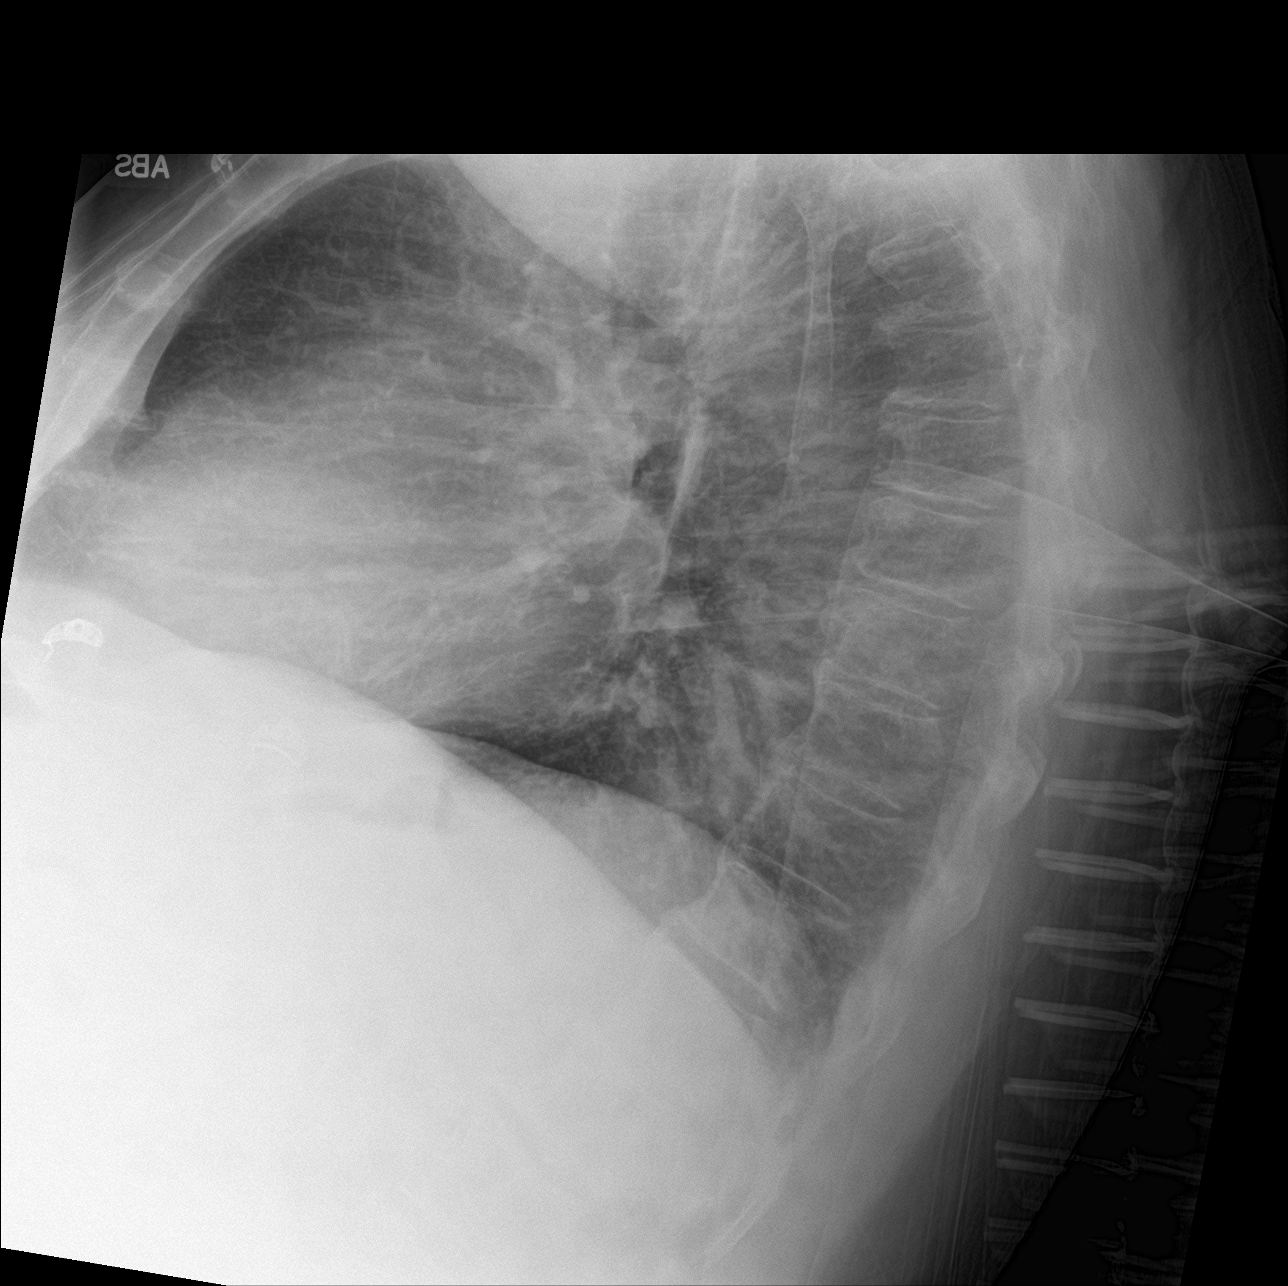

[3 of 3 positions shown; findings below may reference images not displayed]

FINDINGS: Mild, diffuse chronic appearing increased lung markings are seen.
There is no evidence of acute infiltrate, pleural effusion or
pneumothorax. The heart size and mediastinal contours are within
normal limits. The visualized skeletal structures are unremarkable.
IMPRESSION: No active cardiopulmonary disease.

## 2021-07-01 IMAGING — CT CT CHEST W/O CM
2 of 3 series · 14 of 36 positions shown, 17 images · non-contrast
Comparison: 11/19/2019, 05/09/2019

CLINICAL DATA: Hemoptysis, chest pain, nausea

EXAM:
CT CHEST WITHOUT CONTRAST
TECHNIQUE: Multidetector CT imaging of the chest was performed following the
standard protocol without IV contrast.

[Series 2: thorax · axial · 0.91mm/px · z∈[+244,+474]mm · 11 of 136 slices shown, 14 images]
[im 11/136  mediastinal]
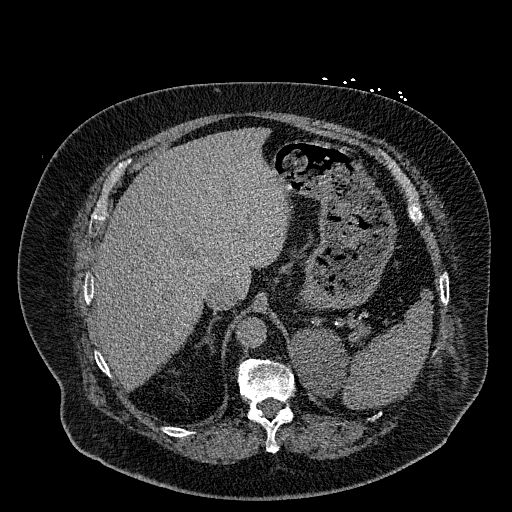
[im 11/136  lung]
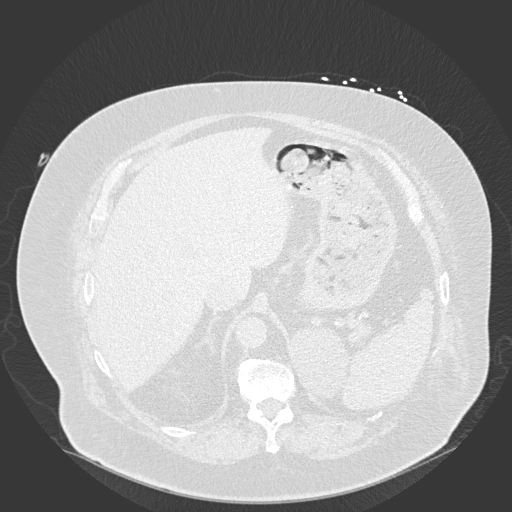
[im 21/136  lung]
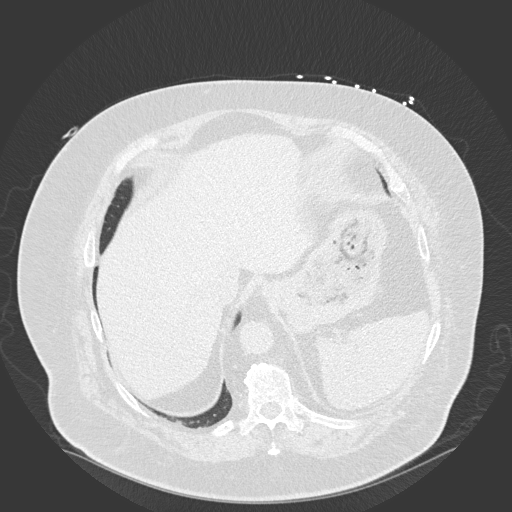
[im 31/136  lung]
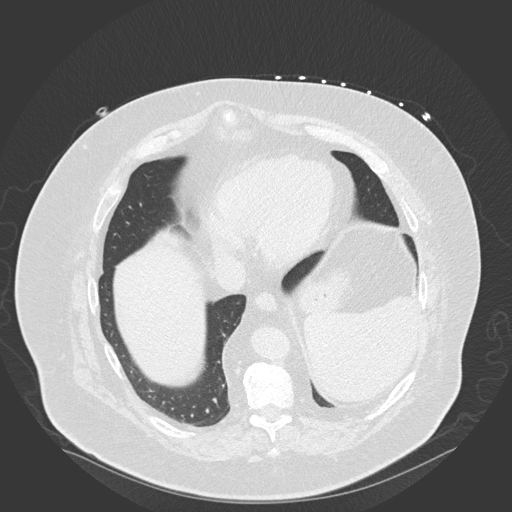
[im 46/136  lung]
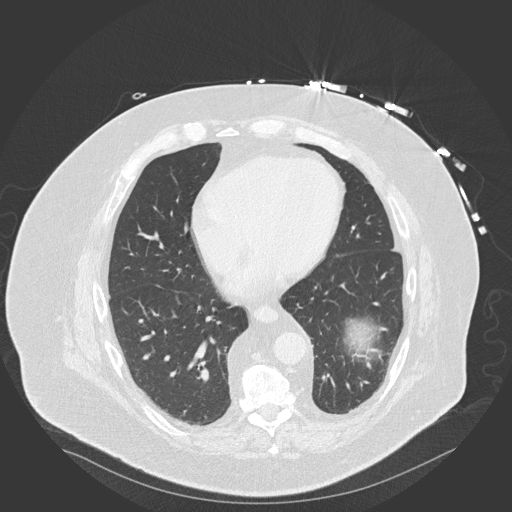
[im 56/136  mediastinal]
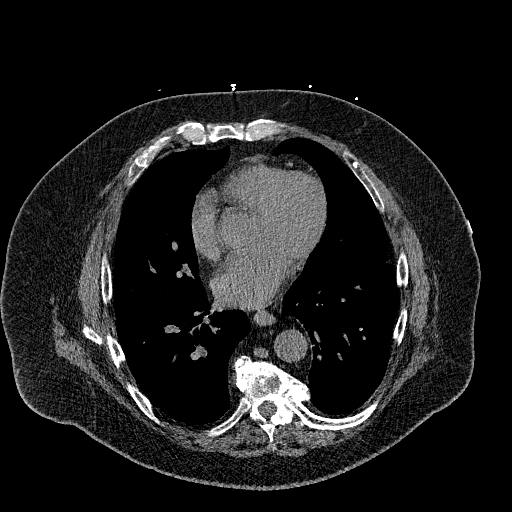
[im 56/136  lung]
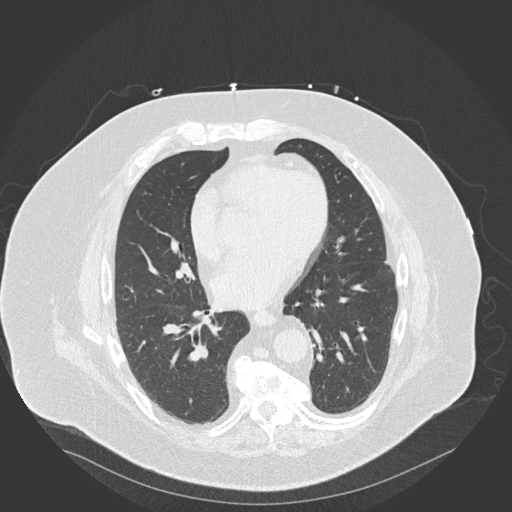
[im 71/136  lung]
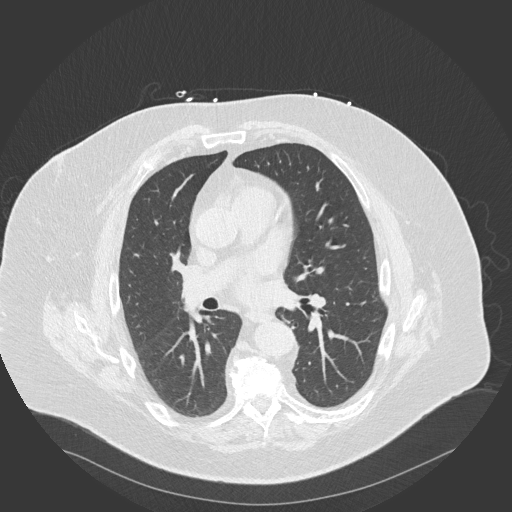
[im 81/136  lung]
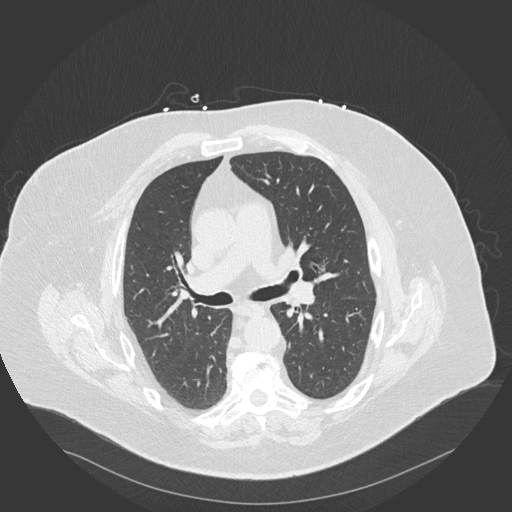
[im 91/136  lung]
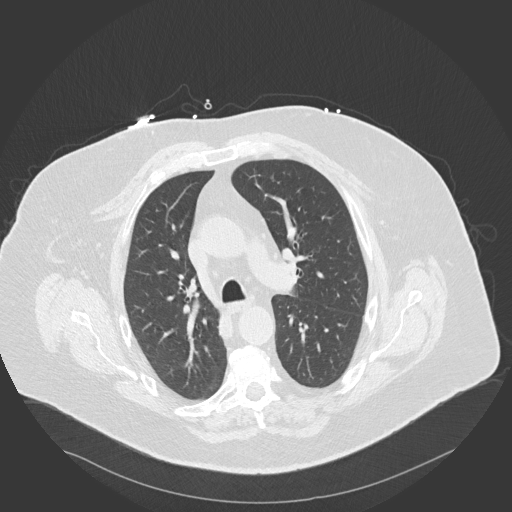
[im 106/136  mediastinal]
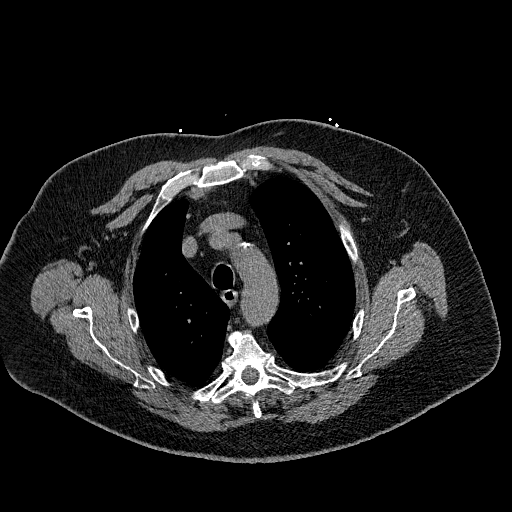
[im 106/136  lung]
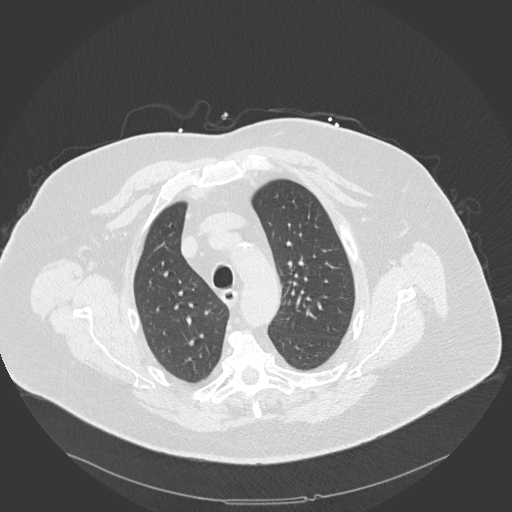
[im 116/136  lung]
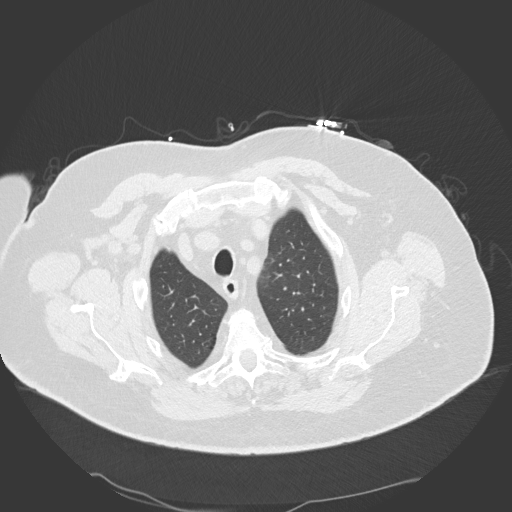
[im 126/136  lung]
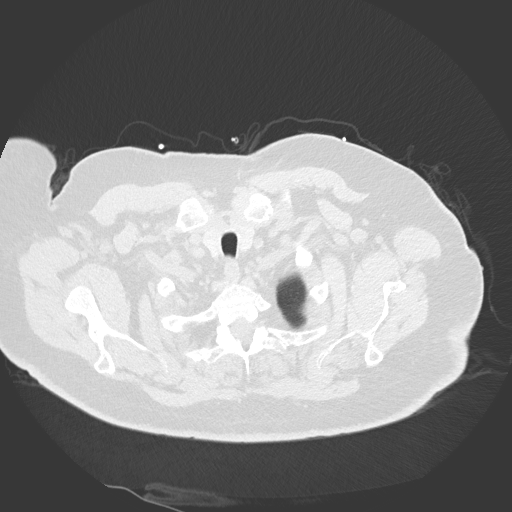

[Series 5: coronal · coronal · 0.54mm/px · 3 of 170 slices shown]
[im 34/170  lung]
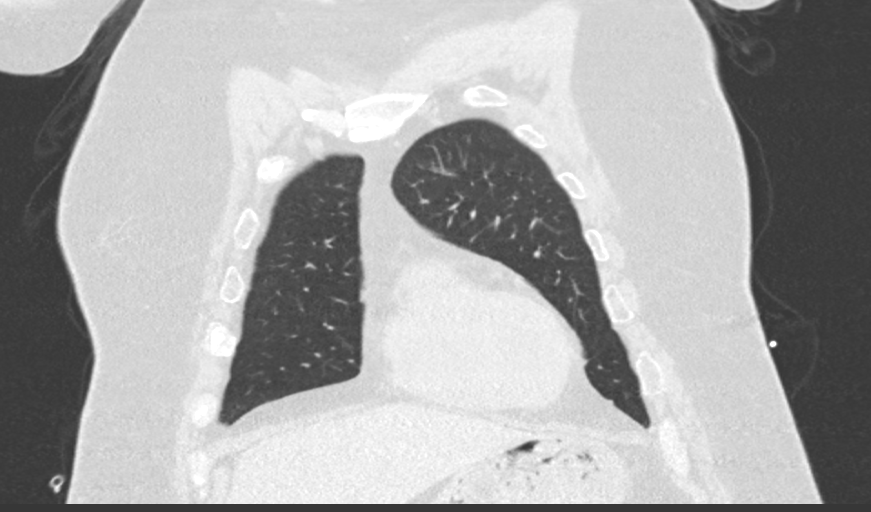
[im 68/170  lung]
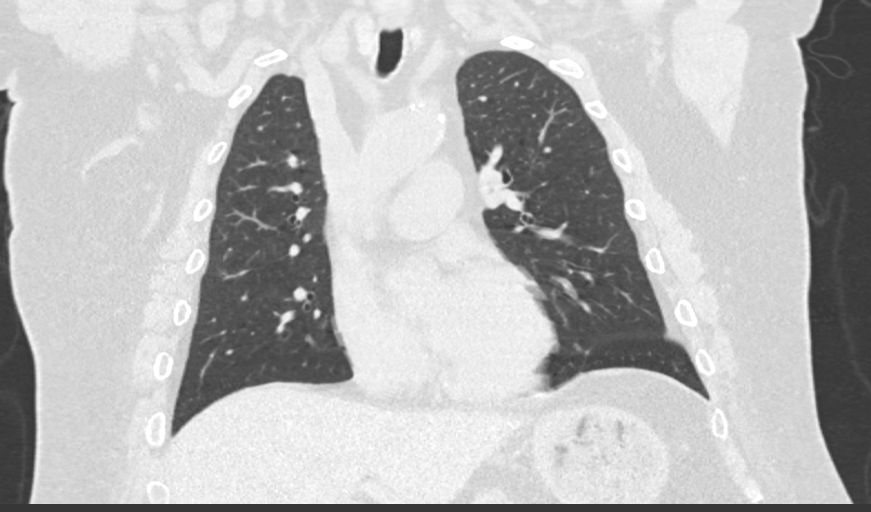
[im 102/170  lung]
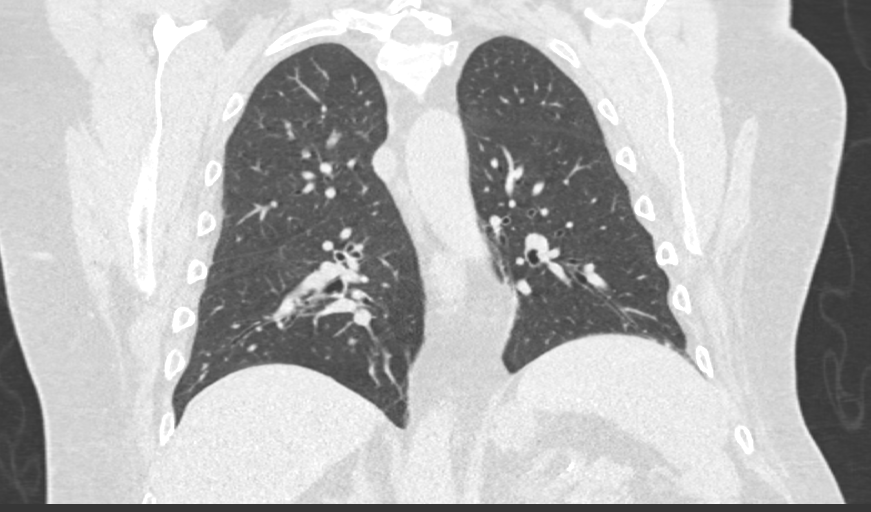

[14 of 36 positions shown; findings below may reference images not displayed]

FINDINGS: Cardiovascular: Unenhanced imaging of the heart and great vessels
demonstrates no pericardial effusion. There is moderate
atherosclerosis of the aortic arch. There is severe atherosclerosis
of the LAD distribution of the coronary vasculature. Normal caliber
of the thoracic aorta.

Mediastinum/Nodes: No enlarged mediastinal or axillary lymph nodes.
Thyroid gland, trachea, and esophagus demonstrate no significant
findings.

Lungs/Pleura: No acute airspace disease, effusion, or pneumothorax.

Upper Abdomen: Partial visualization of a left adrenal mass
measuring 7.7 x 5.6 cm. Please refer to prior CT abdomen report
04/24/2019 describing probable left adrenal adenoma. Right renal
cyst is partially visualized. The remainder of the upper abdomen is
unremarkable.

Musculoskeletal: Prior resection of the left eleventh rib related to
nephrectomy, with herniation of fat through the surgical defect
unchanged. No acute or destructive bony lesions. Reconstructed
images demonstrate no additional findings.
IMPRESSION: 1. No acute intrathoracic process.
2. Partial visualization of left adrenal adenoma, please refer to
prior abdominal CT report 04/24/2019 for full characterization and
recommendation.
3. Aortic Atherosclerosis ([id number].G).

## 2021-07-01 NOTE — Telephone Encounter (Signed)
Left another vm to schedule hospital follow up-[name] 

## 2021-07-01 NOTE — Progress Notes (Addendum)
General Review Call   Larry Callahan,Larry Callahan  [id number] 38 years, Male  DOB: 09-13-1955  M: [id number]  General Review [facility]) Completed by Charlann Lange on 07/01/2021  Chart Review What recent interventions/DTPs have been made by any provider to improve the patient's conditions in the last 3 months?:  Office Visit: 05/20/21 [name], NP. For Hidradenitis. STARTED Clindamycin Phosphate 1% Apply to the affected area two times daily until resolved. Then use as needed for flare ups. Any recent hospitalizations or ED visits since last visit with CPP?: Yes Brief Summary (including Medication and/or Diagnosis changes):: 06/11/21 [place], (3 days) Jennette Dubin, MD For Hydradenitis. STARTED Doxycycline Monohydrate 100 mg 2 times daily.  06/27/21 [place] (2 days) [place], Suann Larry, MD. For COPD. PER NOTE: discharged with Pulmicort nebulizer for 7 days. , To treat it, use the two breathing treatments in your nebulizer, Use albuterol-ipratropium (Duo-neb) three times daily for the next week, Use budesonide (Pulmicort) once daily for the next week After 1 week:Use the albuterol-ipratropium only as needed, Stop the Pulmicort. STOPPED Albuterol 108 MCG/ACT and Isosorbide Mononitrate 30 MG 24 HR. CHANGED Furosemide 40 mg to take one tab once or twice daily for fluid/swelling.  Adherence Review Adherence rates for STAR metric medications:  Rosuvastatin 10 mg 06/21/21 90 DS Glimepiride 2 mg 04/15/21 90 DS  Adherence rates for medications indicated for disease state being reviewed:  Rosuvastatin 10 mg 06/21/21 90 DS Glimepiride 2 mg 04/15/21 90 DS  Does the patient have >5 day gap between last estimated fill dates for any of the above medications?: No  Disease State Questions Able to connect with the Patient?: Yes  Did patient have any problems with their health recently?: No  Did patient have any problems with their  pharmacy?: No  Does patient have any issues or side effects with their medications?: No  Additional  information to pass to Patient's CPP?: Yes  Note additional concerns and questions from Patient: Patient is having transportation issues and is not sure when he can get his hospital follow-up with [facility]. I the [facility] gave him advise on how to make the transportation work out for him.  Anything we can do to help take better care of Patient?: No  Charlann Lange, [place]  [phone]  Pharmacist Review Adherence gaps identified?: No Drug Therapy Problems identified?: No Assessment: Controlled   Reviewed by: Alena Bills, PharmD Clinical Pharmacist [phone]

## 2021-07-06 ENCOUNTER — Telehealth: Payer: Self-pay

## 2021-07-06 NOTE — Telephone Encounter (Signed)
Larry Callahan [phone]) from [place] that pt cancelled his PT, OT, skilled nursing and social work due to him not feeling well and will do his appt next week

## 2021-07-11 DIAGNOSIS — I5022 Chronic systolic (congestive) heart failure: Secondary | ICD-10-CM | POA: Diagnosis not present

## 2021-07-11 DIAGNOSIS — I10 Essential (primary) hypertension: Secondary | ICD-10-CM | POA: Diagnosis not present

## 2021-07-13 ENCOUNTER — Other Ambulatory Visit: Payer: Self-pay

## 2021-07-13 ENCOUNTER — Telehealth: Payer: Self-pay

## 2021-07-13 DIAGNOSIS — I10 Essential (primary) hypertension: Secondary | ICD-10-CM

## 2021-07-13 MED ORDER — POTASSIUM CHLORIDE CRYS ER 20 MEQ PO TBCR: 20.0000 meq | EXTENDED_RELEASE_TABLET | Freq: Every day | ORAL | 1 refills | Status: DC

## 2021-07-13 MED ORDER — CHLORTHALIDONE 25 MG PO TABS: 25.0000 mg | ORAL_TABLET | Freq: Every day | ORAL | 1 refills | Status: DC

## 2021-07-13 MED ORDER — FUROSEMIDE 40 MG PO TABS: 40.0000 mg | ORAL_TABLET | Freq: Every day | ORAL | 1 refills | Status: DC

## 2021-07-13 NOTE — Telephone Encounter (Signed)
Please call patient and let him know to take 1 tablet of lasix daily. I also added another stronger diuretic called chlorthalidone 25 mg, take 1 tablet daily and I would also like for him to take his potassium 1 tablet daily as well. Please make sure he has an appointment to follow up within the next 30 days.

## 2021-07-14 ENCOUNTER — Encounter: Payer: Self-pay | Admitting: Nurse Practitioner

## 2021-07-14 ENCOUNTER — Other Ambulatory Visit: Payer: Self-pay

## 2021-07-14 ENCOUNTER — Ambulatory Visit (INDEPENDENT_AMBULATORY_CARE_PROVIDER_SITE_OTHER): Payer: Medicare PPO | Admitting: Nurse Practitioner

## 2021-07-14 VITALS — BP 150/90 | HR 73 | Temp 98.6°F | Resp 16 | Ht 71.0 in | Wt 264.8 lb

## 2021-07-14 DIAGNOSIS — I10 Essential (primary) hypertension: Secondary | ICD-10-CM

## 2021-07-14 DIAGNOSIS — R7989 Other specified abnormal findings of blood chemistry: Secondary | ICD-10-CM | POA: Diagnosis not present

## 2021-07-14 DIAGNOSIS — E1122 Type 2 diabetes mellitus with diabetic chronic kidney disease: Secondary | ICD-10-CM | POA: Diagnosis not present

## 2021-07-14 DIAGNOSIS — Z794 Long term (current) use of insulin: Secondary | ICD-10-CM

## 2021-07-14 DIAGNOSIS — I5022 Chronic systolic (congestive) heart failure: Secondary | ICD-10-CM | POA: Diagnosis not present

## 2021-07-14 DIAGNOSIS — N1832 Chronic kidney disease, stage 3b: Secondary | ICD-10-CM

## 2021-07-14 DIAGNOSIS — L732 Hidradenitis suppurativa: Secondary | ICD-10-CM | POA: Diagnosis not present

## 2021-07-14 DIAGNOSIS — J449 Chronic obstructive pulmonary disease, unspecified: Secondary | ICD-10-CM | POA: Diagnosis not present

## 2021-07-14 DIAGNOSIS — E876 Hypokalemia: Secondary | ICD-10-CM | POA: Diagnosis not present

## 2021-07-14 MED ORDER — POTASSIUM CHLORIDE CRYS ER 20 MEQ PO TBCR: 20.0000 meq | EXTENDED_RELEASE_TABLET | Freq: Two times a day (BID) | ORAL | 1 refills | Status: DC

## 2021-07-14 NOTE — Progress Notes (Signed)
[facility] [place] [address] [facility] Discharge Acute Issues Care Follow Up                                                                        Patient Demographics  Larry Callahan, is a 66 y.o. male  DOB 1955/08/17  MRN [id number].  Primary MD  [name], NP   Reason for TCC follow Up - COPD exacerbation   Past Medical History:  Diagnosis Date   Allergy    Chronic combined systolic (congestive) and diastolic (congestive) heart failure ([place])    a. 04/2019 Echo: EF 45-50%; b. 02/2019 Echo: EF 55-60%; c. 09/2020 Echo: EF 35-40%, glob HK. Nl RV size/fxn. Mild MR.   CKD (chronic kidney disease), stage III (HCC)    COPD (chronic obstructive pulmonary disease) ([place])    Coronary artery disease    a. 2011 Cath: nonobs dzs; b. 09/2020 MV: EF 38%. No signif ischemia. ? inf MI vs diaph attenuation. GI uptake artifact.   Diabetes mellitus without complication ([place])    GERD (gastroesophageal reflux disease)    Hyperlipidemia    Hypertension    Morbid obesity ([place])    NICM (nonischemic cardiomyopathy) ([place])    a. 04/2019 Echo: EF 45-50%; b. 02/2019 Echo: EF 55-60%; c. 09/2020 Echo: EF 35-40%, glob HK; d. 09/2020 MV: No ischemia. ? inf infarct vs attenuation.   OSA (obstructive sleep apnea)     Past Surgical History:  Procedure Laterality Date   BACK SURGERY     CARDIAC CATHETERIZATION     CHOLECYSTECTOMY     COLONOSCOPY WITH PROPOFOL N/A 04/12/2019   Procedure: COLONOSCOPY WITH PROPOFOL;  Surgeon: [name], MD;  Location: [facility] ENDOSCOPY;  Service: Gastroenterology;  Laterality: N/A;   ESOPHAGOGASTRODUODENOSCOPY N/A 04/12/2019   Procedure: ESOPHAGOGASTRODUODENOSCOPY (EGD);  Surgeon: [name], MD;  Location: [facility] ENDOSCOPY;  Service: Gastroenterology;  Laterality: N/A;   ESOPHAGOGASTRODUODENOSCOPY (EGD) WITH  PROPOFOL N/A 11/21/2019   Procedure: ESOPHAGOGASTRODUODENOSCOPY (EGD) WITH PROPOFOL;  Surgeon: [name], MD;  Location[facility] ENDOSCOPY;  Service: Gastroenterology;  Laterality: N/A;   FLEXIBLE BRONCHOSCOPY Bilateral 05/17/2019   Procedure: FLEXIBLE BRONCHOSCOPY;  Surgeon: [name], MD;  Location: [name] ORS;  Service: Pulmonary;  Laterality: Bilateral;   left arm surgery     nephrectomy Left    PARATHYROIDECTOMY     RIGHT HEART CATH N/A 03/29/2019   Procedure: RIGHT HEART CATH;  Surgeon: [name], MD;  Location: [facility] CV LAB;  Service: Cardiovascular;  Laterality: N/A;       Recent HPI and [facility] [facility] Course   Larry Callahan is a 66 y.o. M with COPD, DM, HTN, sCHF, CKD IV baseline Cr 1.8-2.4, who presented with 1 day new dyspnea, productive cough and wheezing with pleuritic chest pain. In the ER, SpO2 89%, dyspneic.  Cr 2.4, CXR clear, dimer elevated. Started on steroids and admitted for COPD flare.   * COPD exacerbation ([place])- (present  on admission) Patient started on systemic steroids and had marked hyperglycemia. On day of discharge, his dyspnea was slightly worse than recent baseline but he was able to ambulate to nursing station independently and without desaturation on room air. Given hyperglycemia, discharged with Pulmicort nebulizer for 7 days and close PCP follow up.   CKD (chronic kidney disease), stage IV ([place])- (present on admission) Patient Cr 2.5 on admission, given fluids without improvement, Cr remained stable in 2.5-2.7 range.  On retrospect, patient's Cr has been in the 2-2.5 range for much of the last year.   Here, he did not seem dehydrated, and his urinalysis showed protein but no inflammatory sediment. I suspect he has worsening CKD to stage IV.  Likely this will trigger CHF soon, and his prognosis is worsening.   Chest pain- (present on admission) VQ negative. CXR clear.     On further questioning, patient is indicating  pain from his hidradenitis under his arm, which appeared benign.  Encouraged to follow up with his dermatologist.  Chronic systolic CHF (congestive heart failure) ([place])- (present on admission) BNP normal.  CXR without edema.  No significant LE edema. Did not appear to be in failure.    Follow up with Dr. Humphrey Rolls in 1 week Dr. Humphrey Rolls: Please follow renal function in 1 week; if stable in high 2s, please refer to nephrology Dr. Humphrey Rolls: Sister concerned that patient's breathing is worse since consolidating inhalers as [facility] Acute Care Issue to be followed in the Clinic   Anemia in chronic kidney disease (CKD) Acquired thrombophilia ([place]) PAD (peripheral artery disease) ([place]) Chronic systolic CHF Uncontrolled type 2 diabetes mellitus with hyperglycemia ([place]) Essential hypertension COPD (chronic obstructive pulmonary disease) ([place]) Morbid (severe) obesity due to excess calories ([place]) Obstructive sleep apnea, adult   Subjective:   Larry Callahan today has, No headache, No chest pain, No abdominal pain - No Nausea, No new weakness tingling or numbness. Patient always has baseline intermittent SOB, wheezing and cough. He denies any difficulty breathing and feels much better now.   Assessment & Plan   1. Chronic obstructive pulmonary disease, unspecified COPD type ([place]) Was admitted for a COPD exacerbation.  Patient is currently at baseline respiratory status which includes intermittent shortness of breath, wheezing and cough.  He is not in any respiratory distress patient is aware of what to do if he has difficulty breathing.  He has a history of exacerbations and has had pneumonia in the past.  2. Elevated serum creatinine His creatinine level is elevated above 2.0.  Recommendation from the hospitalist is to repeat renal function in a week and refer to nephrology if consistently in the high 2s. - CMP14+EGFR  3. Primary hypertension Patient continues to have persistent  hypertension that is resistant to medication, patient also has compliance issues at times.  Blood pressure is significantly elevated today.  Patient had not taken some of his medications today.  Medication list thoroughly reviewed with patient at today's visit.  We will follow-up with blood pressure in a month  4. Chronic systolic CHF (congestive heart failure) (HCC) Patient has chronic congestive heart failure and is followed by cardiology, Dr. [name].  5. Stage 3b chronic kidney disease ([place]) Patient's current kidney status is stage IIIb CKD, patient is not established with a nephrologist at this time, will consider referral pending lab results.  6. Type 2 diabetes mellitus with stage 3b chronic kidney disease, with long-term current use of insulin ([place]) His most recent A1c was  6.8 in October, diabetes is stable at this time we will repeat A1c in 1 month.   7. Hypokalemia History of low potassium, continue potassium supplement, refills ordered. - potassium chloride SA (KLOR-CON M) 20 MEQ tablet; Take 1 tablet (20 mEq total) by mouth 2 (two) times daily.  Dispense: 180 tablet; Refill: 1  8. Hidradenitis Continue to follow-up with dermatology    Reason for frequent admissions/ER visits    Chronic systolic CHF Anemia CKD COPD HTN diabetes   Objective:   Vitals:   07/14/21 1136  BP: (!) 150/90  Pulse: 73  Resp: 16  Temp: 98.6 F (37 C)  SpO2: 98%  Weight: 264 lb 12.8 oz (120.1 kg)  Height: _0  (1.803 m)    Wt Readings from Last 3 Encounters:  08/05/21 264 lb (119.7 kg)  07/14/21 264 lb 12.8 oz (120.1 kg)  06/27/21 270 lb (122.5 kg)    Allergies as of 07/14/2021       Reactions   Penicillins Rash        Medication List        Accurate as of July 14, 2021 11:59 PM. If you have any questions, ask your nurse or doctor.          amLODipine 10 MG tablet Commonly known as: NORVASC Take 1 tablet (10 mg total) by mouth daily.   apixaban 2.5 MG  Tabs tablet Commonly known as: ELIQUIS Take 1 tablet (2.5 mg total) by mouth 2 (two) times daily.   Breztri Aerosphere 160-9-4.8 MCG/ACT Aero Generic drug: Budeson-Glycopyrrol-Formoterol Inhale 2 puffs into the lungs in the morning and at bedtime.   budesonide 0.25 MG/2ML nebulizer solution Commonly known as: PULMICORT Take 2 mLs (0.25 mg total) by nebulization daily for 7 days.   carvedilol 12.5 MG tablet Commonly known as: Coreg Take 1 tablet (12.5 mg total) by mouth 2 (two) times daily.   cetirizine 10 MG tablet Commonly known as: ZYRTEC Take 10 mg by mouth daily.   chlorthalidone 25 MG tablet Commonly known as: HYGROTON Take 1 tablet (25 mg total) by mouth daily.   clindamycin 1 % gel Commonly known as: Clindagel Apply to the affected area two times daily until resolved. Then use as needed for flare ups   cyanocobalamin 1000 MCG tablet Take 1 tablet (1,000 mcg total) by mouth daily.   furosemide 40 MG tablet Commonly known as: LASIX Take 1 tablet (40 mg total) by mouth daily.   gabapentin 100 MG capsule Commonly known as: NEURONTIN Take 1 capsule (100 mg total) by mouth in the morning, at noon, and at bedtime.   glimepiride 2 MG tablet Commonly known as: AMARYL Take one tab po qd with supper   hydrALAZINE 100 MG tablet Commonly known as: APRESOLINE Take 1 tablet (100 mg total) by mouth 3 (three) times daily.   insulin glargine (1 Unit Dial) 300 UNIT/ML Solostar Pen Commonly known as: TOUJEO Inject 10 Units into the skin daily. Reduced from prior 26 units daily.   ipratropium-albuterol 0.5-2.5 (3) MG/3ML Soln Commonly known as: DUONEB Take 3 mLs by nebulization every 6 (six) hours as needed.   meclizine 25 MG tablet Commonly known as: ANTIVERT Take 1 tablet (25 mg total) by mouth 3 (three) times daily as needed for dizziness or nausea.   Novofine Pen Needle 32G X 6 MM Misc Generic drug: Insulin Pen Needle Use as directed   omeprazole 40 MG  capsule Commonly known as: PRILOSEC Take 1 capsule (40 mg total) by mouth  in the morning and at bedtime.   potassium chloride SA 20 MEQ tablet Commonly known as: KLOR-CON M Take 1 tablet (20 mEq total) by mouth 2 (two) times daily. What changed: when to take this Changed by: [name], NP   rosuvastatin 10 MG tablet Commonly known as: CRESTOR Take 1 tablet (10 mg total) by mouth at bedtime.   True Metrix Air Glucose Meter Devi 1 Device by Does not apply route 2 (two) times daily.   True Metrix Blood Glucose Test test strip Generic drug: glucose blood Use as instructed twice daily diag E11.65   True Metrix Level 1 Low Soln Use as directed   TRUEplus Lancets 33G Misc Use as directed twice daily diag E11.65         Physical Exam: Constitutional: Patient appears well-developed and well-nourished. Not in obvious distress. HENT: Normocephalic, atraumatic, External right and left ear normal. Oropharynx is clear and moist.  Eyes: Conjunctivae and EOM are normal. PERRLA, no scleral icterus. Neck: Normal ROM. Neck supple. No JVD. No tracheal deviation. No thyromegaly. CVS: RRR, S1/S2 +, no murmurs, no gallops, no carotid bruit.  Pulmonary: Effort and breath sounds normal, no stridor, rhonchi, wheezes, rales.  Abdominal: Soft. BS +, no distension, tenderness, rebound or guarding.  Musculoskeletal: Normal range of motion. No edema and no tenderness.  Lymphadenopathy: No lymphadenopathy noted, cervical, inguinal or axillary Neuro: Alert. Normal reflexes, muscle tone coordination. No cranial nerve deficit. Skin: Skin is warm and dry. No rash noted. Not diaphoretic. No erythema. No pallor. Psychiatric: Normal mood and affect. Behavior, judgment, thought content normal.   Data Review   Micro Results Recent Results (from the past 240 hour(s))  Resp Panel by RT-PCR (Flu A&B, Covid) Nasopharyngeal Swab     Status: None   Collection Time: 08/05/21  3:59 PM   Specimen:  Nasopharyngeal Swab; Nasopharyngeal(NP) swabs in vial transport medium  Result Value Ref Range Status   SARS Coronavirus 2 by RT PCR NEGATIVE NEGATIVE Final    Comment: (NOTE) SARS-CoV-2 target nucleic acids are NOT DETECTED.  The SARS-CoV-2 RNA is generally detectable in upper respiratory specimens during the acute phase of infection. The lowest concentration of SARS-CoV-2 viral copies this assay can detect is 138 copies/mL. A negative result does not preclude SARS-Cov-2 infection and should not be used as the sole basis for treatment or other patient management decisions. A negative result may occur with  improper specimen collection/handling, submission of specimen other than nasopharyngeal swab, presence of viral mutation(s) within the areas targeted by this assay, and inadequate number of viral copies(<138 copies/mL). A negative result must be combined with clinical observations, patient history, and epidemiological information. The expected result is Negative.  Fact Sheet for Patients:  EntrepreneurPulse.com.au  Fact Sheet for Healthcare Providers:  IncredibleEmployment.be  This test is no t yet approved or cleared by the [place] FDA and  has been authorized for detection and/or diagnosis of SARS-CoV-2 by FDA under an Emergency Use Authorization (EUA). This EUA will remain  in effect (meaning this test can be used) for the duration of the COVID-19 declaration under Section 564(b)(1) of the Act, 21 U.S.C.section 360bbb-3(b)(1), unless the authorization is terminated  or revoked sooner.       Influenza A by PCR NEGATIVE NEGATIVE Final   Influenza B by PCR NEGATIVE NEGATIVE Final    Comment: (NOTE) The Xpert Xpress SARS-CoV-2/FLU/RSV plus assay is intended as an aid in the diagnosis of influenza from Nasopharyngeal swab specimens and should not be used  as a sole basis for treatment. Nasal washings and aspirates are unacceptable for  Xpert Xpress SARS-CoV-2/FLU/RSV testing.  Fact Sheet for Patients: EntrepreneurPulse.com.au  Fact Sheet for Healthcare Providers: IncredibleEmployment.be  This test is not yet approved or cleared by the [place] FDA and has been authorized for detection and/or diagnosis of SARS-CoV-2 by FDA under an Emergency Use Authorization (EUA). This EUA will remain in effect (meaning this test can be used) for the duration of the COVID-19 declaration under Section 564(b)(1) of the Act, 21 U.S.C. section 360bbb-3(b)(1), unless the authorization is terminated or revoked.  Performed at [facility], [address] [place], [place] [address]      CBC Recent Labs  Lab 08/05/21 1554 08/06/21 0325  WBC 6.9 5.2  HGB 9.4* 8.8*  HCT 30.6* 28.6*  PLT 226 220  MCV 82.0 82.7  MCH 25.2* 25.4*  MCHC 30.7 30.8  RDW 15.7* 15.8*    Chemistries  Recent Labs  Lab 08/05/21 1554 08/06/21 0325  NA 133* 134*  K 4.3 4.2  CL 101 101  CO2 22 23  GLUCOSE 166* 377*  BUN 49* 58*  CREATININE 2.50* 2.55*  CALCIUM 8.1* 8.3*  MG  --  1.9  AST  --  19  ALT  --  20  ALKPHOS  --  61  BILITOT  --  0.6   ------------------------------------------------------------------------------------------------------------------ estimated creatinine clearance is 36.5 mL/min (A) (by C-G formula based on SCr of 2.55 mg/dL (H)). ------------------------------------------------------------------------------------------------------------------ No results for input(s): HGBA1C in the last 72 hours. ------------------------------------------------------------------------------------------------------------------ No results for input(s): CHOL, HDL, LDLCALC, TRIG, CHOLHDL, LDLDIRECT in the last 72 hours. ------------------------------------------------------------------------------------------------------------------ No results for input(s): TSH, T4TOTAL, T3FREE, THYROIDAB  in the last 72 hours.  Invalid input(s): FREET3 ------------------------------------------------------------------------------------------------------------------ No results for input(s): VITAMINB12, FOLATE, FERRITIN, TIBC, IRON, RETICCTPCT in the last 72 hours.  Coagulation profile No results for input(s): INR, PROTIME in the last 168 hours.  No results for input(s): DDIMER in the last 72 hours.  Cardiac Enzymes No results for input(s): CKMB, TROPONINI, MYOGLOBIN in the last 168 hours.  Invalid input(s): CK ------------------------------------------------------------------------------------------------------------------ Invalid input(s): POCBNP  Return in about 1 month (around 08/11/2021) for F/U, Elaura Calix PCP, Recheck A1C.  Time Spent in minutes  45 Time spent with patient included reviewing progress notes, labs, imaging studies, and discussing plan for follow up.  [place] Controlled Substance Database was reviewed by me for overdose risk score (ORS)   This patient was seen by [name], FNP-C in collaboration with Dr. Clayborn Bigness as a part of collaborative care agreement.    [name] MSN, FNP-C on 08/06/2021 at 7:57 AM   **Disclaimer: This note may have been dictated with voice recognition software. Similar sounding words can inadvertently be transcribed and this note may contain transcription errors which may not have been corrected upon publication of note.**

## 2021-07-14 NOTE — Telephone Encounter (Signed)
Spoke to pt on 07/13/21 and tried to explained to him that we sent 3 new medications to his pharmacy.  Pt seemed to not understand instructions very well and I asked that pt could just wait on taking the new medications until he came in to his appt on Thursday 07/15/21 and he can bring all his medications with him and we will go thru them with him.  He advised he would do that.

## 2021-07-15 DIAGNOSIS — R7989 Other specified abnormal findings of blood chemistry: Secondary | ICD-10-CM | POA: Diagnosis not present

## 2021-07-15 IMAGING — CR DG CHEST 2V
2 series · 2 of 2 positions shown · non-contrast
Comparison: 11/19/2018

CLINICAL DATA: Chest pain

EXAM:
CHEST - 2 VIEW

[chest pa]
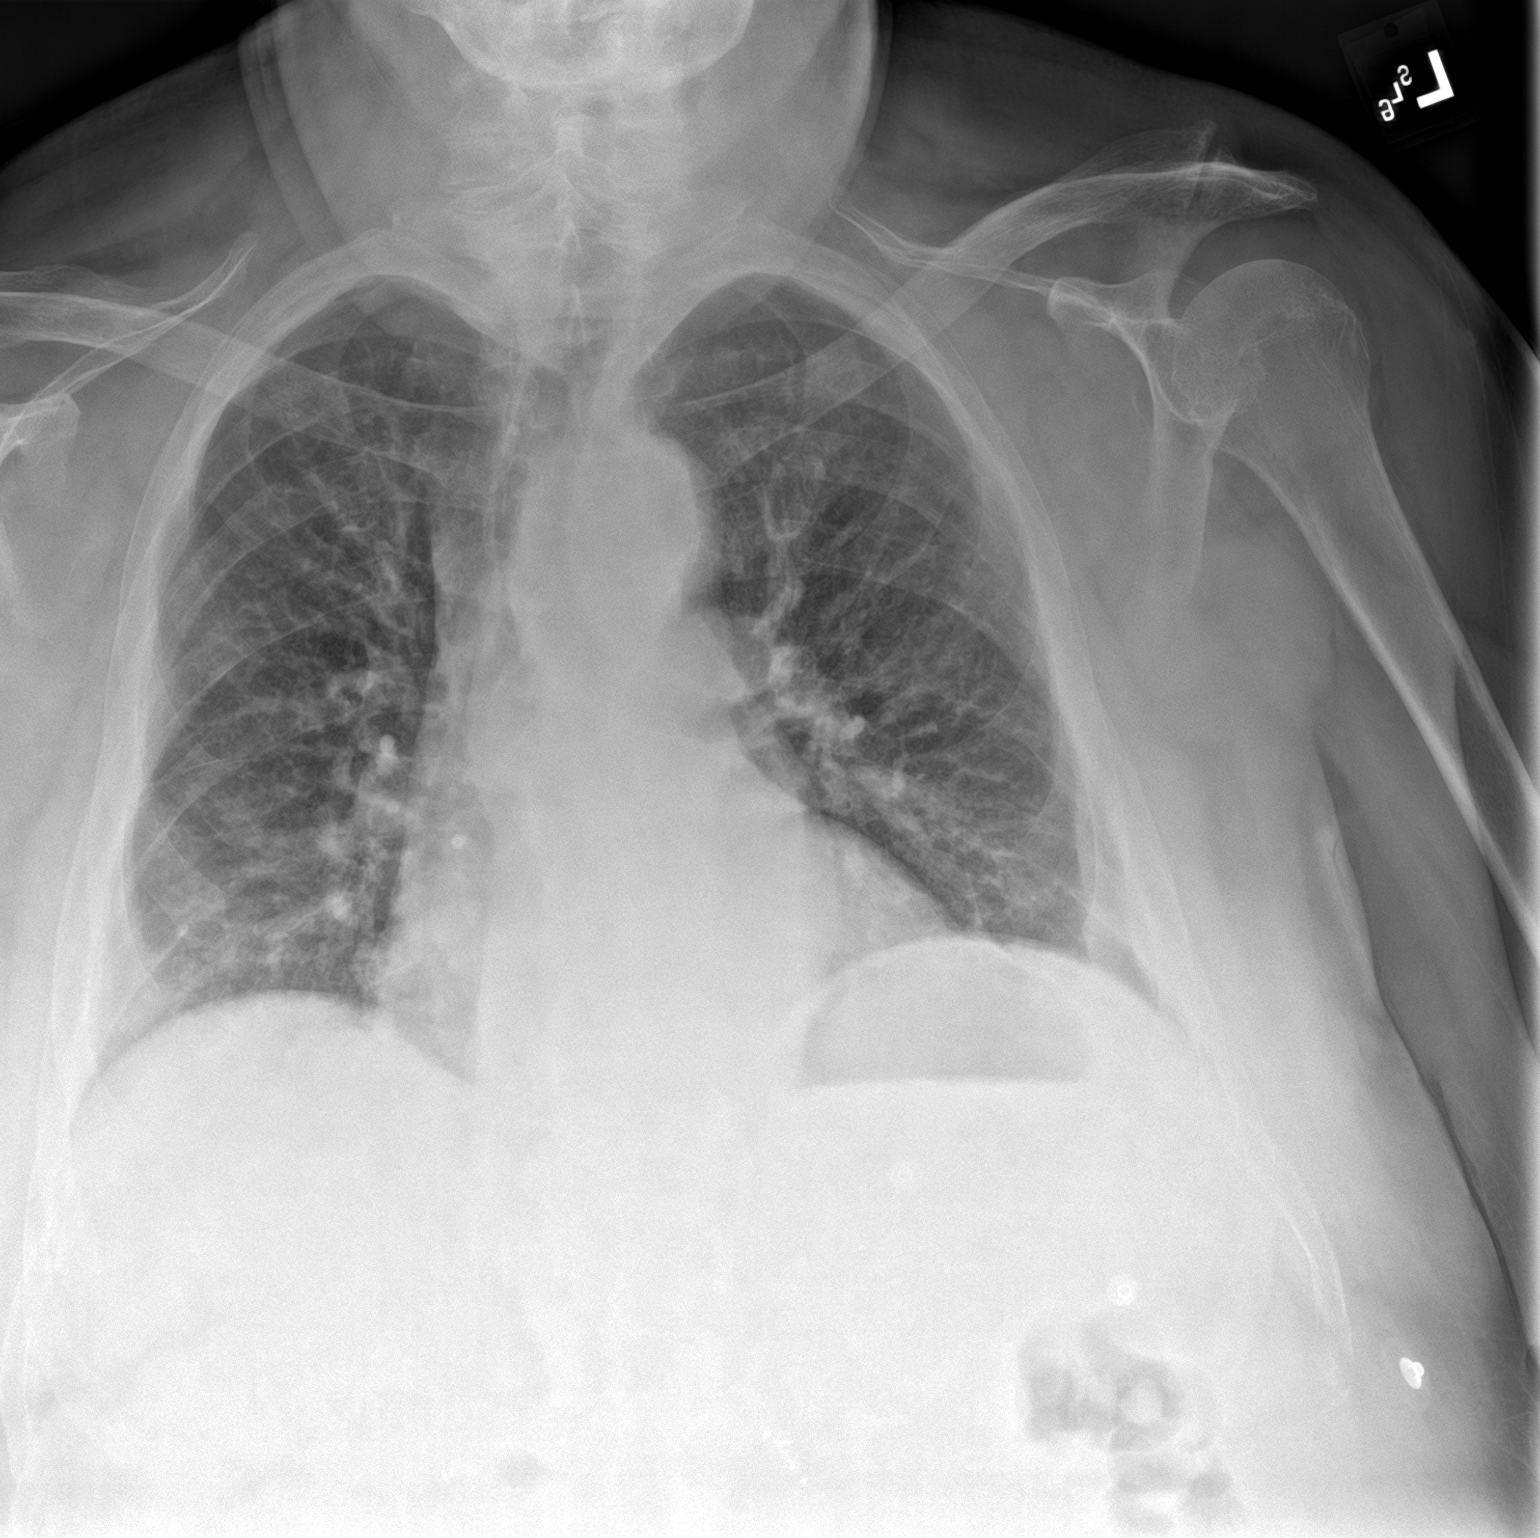

[chest lat]
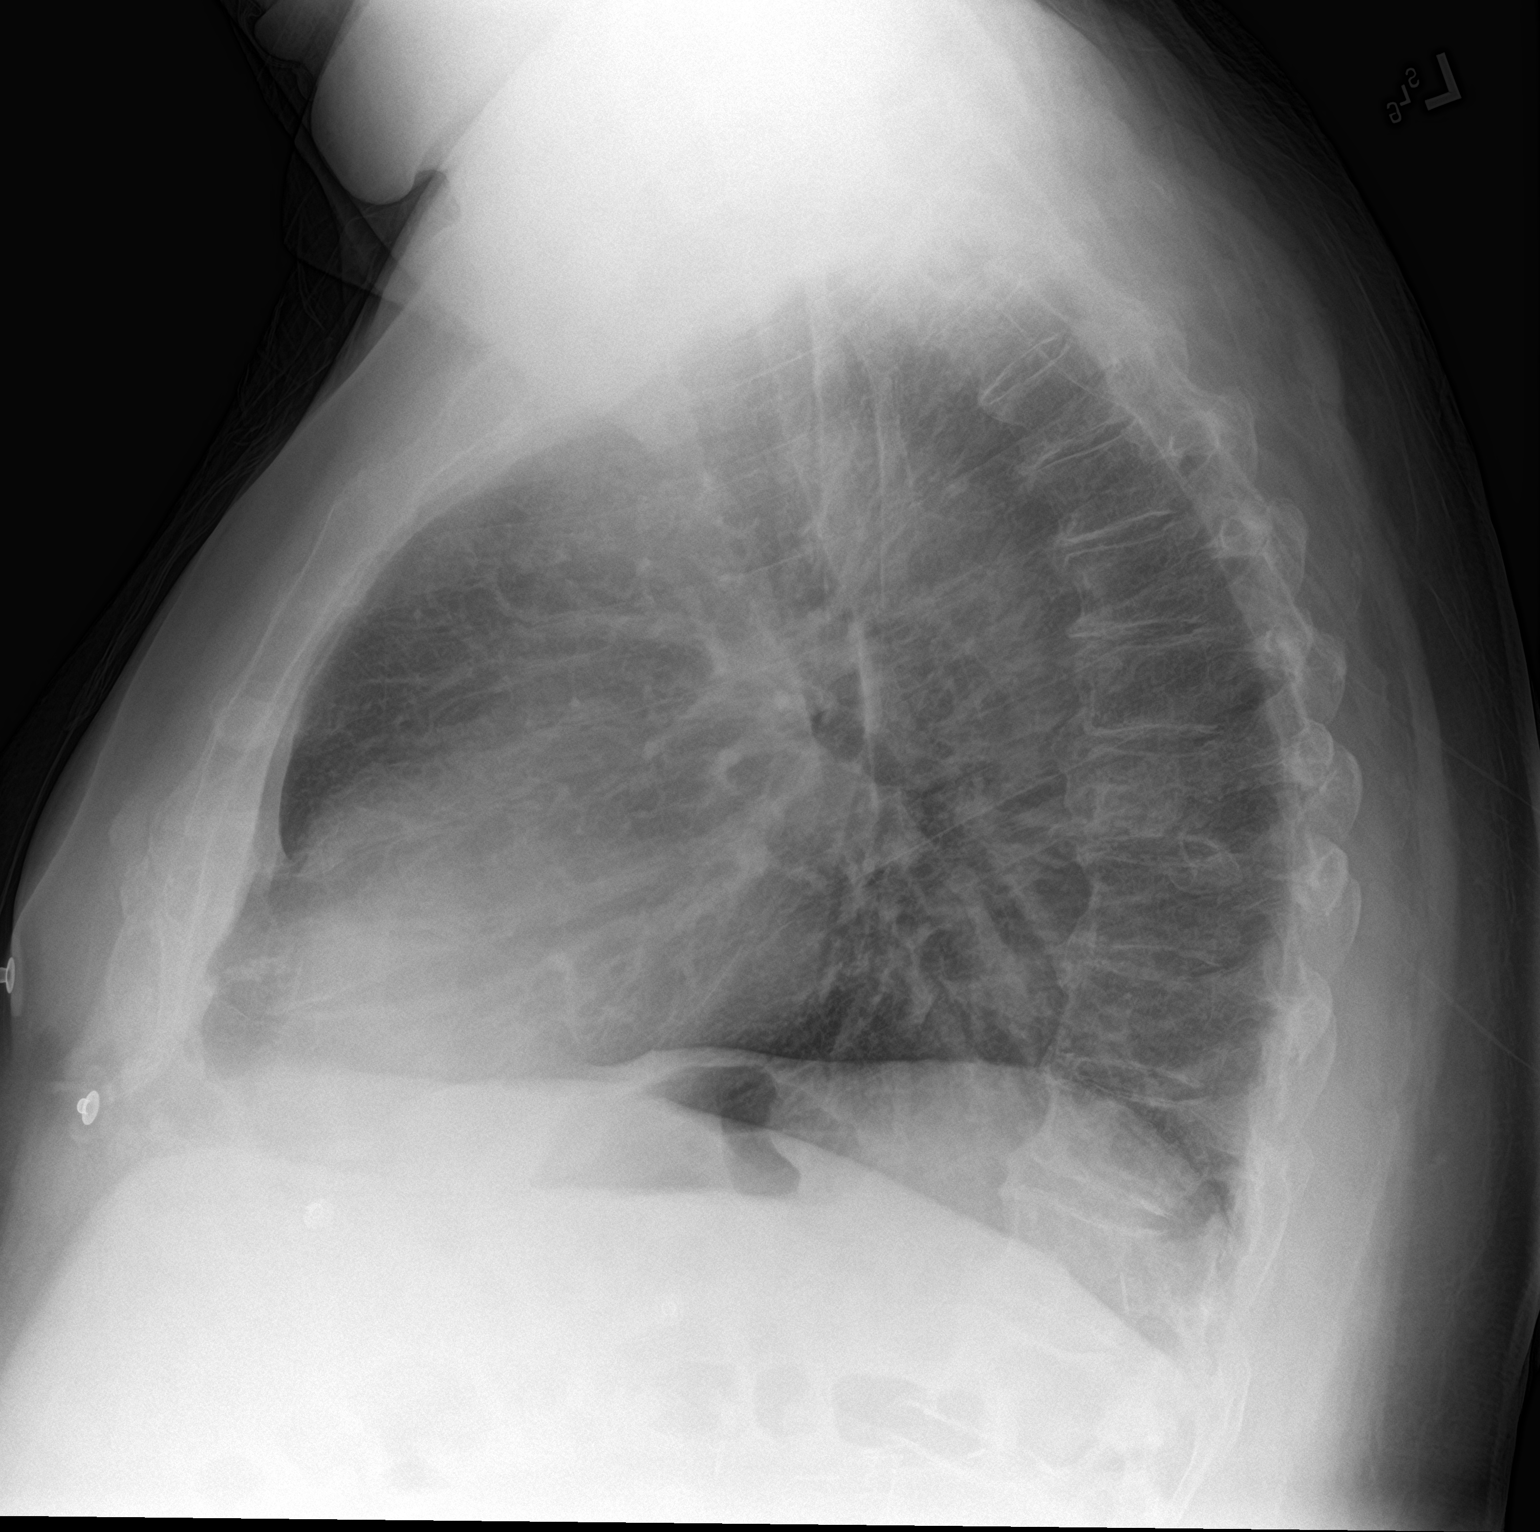

[2 of 2 positions shown; findings below may reference images not displayed]

FINDINGS: There is mild bilateral interstitial thickening likely chronic.
There is no focal consolidation. There is no pleural effusion or
pneumothorax. The heart and mediastinal contours are unremarkable.

There is no acute osseous abnormality.
IMPRESSION: No acute cardiopulmonary disease.

## 2021-07-16 LAB — CMP14+EGFR
ALT: 18 IU/L (ref 0–44)
AST: 22 IU/L (ref 0–40)
Albumin/Globulin Ratio: 1 — ABNORMAL LOW (ref 1.2–2.2)
Albumin: 3.4 g/dL — ABNORMAL LOW (ref 3.8–4.8)
Alkaline Phosphatase: 83 IU/L (ref 44–121)
BUN/Creatinine Ratio: 17 (ref 10–24)
BUN: 41 mg/dL — ABNORMAL HIGH (ref 8–27)
Bilirubin Total: 0.3 mg/dL (ref 0.0–1.2)
CO2: 17 mmol/L — ABNORMAL LOW (ref 20–29)
Calcium: 8.3 mg/dL — ABNORMAL LOW (ref 8.6–10.2)
Chloride: 104 mmol/L (ref 96–106)
Creatinine, Ser: 2.48 mg/dL — ABNORMAL HIGH (ref 0.76–1.27)
Globulin, Total: 3.3 g/dL (ref 1.5–4.5)
Glucose: 146 mg/dL — ABNORMAL HIGH (ref 70–99)
Potassium: 4.5 mmol/L (ref 3.5–5.2)
Sodium: 136 mmol/L (ref 134–144)
Total Protein: 6.7 g/dL (ref 6.0–8.5)
eGFR: 28 mL/min/{1.73_m2} — ABNORMAL LOW (ref >59)

## 2021-07-19 ENCOUNTER — Telehealth: Payer: Self-pay

## 2021-07-19 NOTE — Telephone Encounter (Signed)
Pt called c/o having chills for 2 days and having pain across his chest like a knife going thru it.  Pt has been vomiting brown and green yesterday and some today but has seemed to eased off for now.  Pt also having diarrhea and Headaches.  Pt did mention he had his labs done today.  Spoke to Dr Humphrey Rolls and she advised to send in promethazine 25 mg TID PRN.  I informed pt and he advised that he didn't want [name] to send the medication to his pharmacy that he feels he is given to much medication and that may be what is wrong with him.  I asked pt again if he would like for [name] to send in the nausea medication and pt told me no that he doesn't want it sent in.  I informed pt that if he decided that he may need the medication to call us back and we can send to pharmacy.  I also advised pt that he if gets any worse that he needs to call 911 to go to the ER

## 2021-07-21 ENCOUNTER — Other Ambulatory Visit: Payer: Self-pay

## 2021-07-21 DIAGNOSIS — J449 Chronic obstructive pulmonary disease, unspecified: Secondary | ICD-10-CM

## 2021-07-21 MED ORDER — BREZTRI AEROSPHERE 160-9-4.8 MCG/ACT IN AERO: 2.0000 | INHALATION_SPRAY | Freq: Two times a day (BID) | RESPIRATORY_TRACT | 3 refills | Status: DC

## 2021-07-22 ENCOUNTER — Inpatient Hospital Stay: Payer: Medicare PPO | Admitting: Nurse Practitioner

## 2021-07-22 IMAGING — CT CT RENAL STONE PROTOCOL
2 of 4 series · 16 of 46 positions shown, 18 images · non-contrast
Comparison: CT 04/24/2019

CLINICAL DATA: Hematuria painful urination

EXAM:
CT ABDOMEN AND PELVIS WITHOUT CONTRAST
TECHNIQUE: Multidetector CT imaging of the abdomen and pelvis was performed
following the standard protocol without IV contrast.

[Series 2: stone full standard · axial · 0.98mm/px · z∈[-504,-44]mm · 13 of 102 slices shown, 15 images]
[im 5/102  soft-tissue]
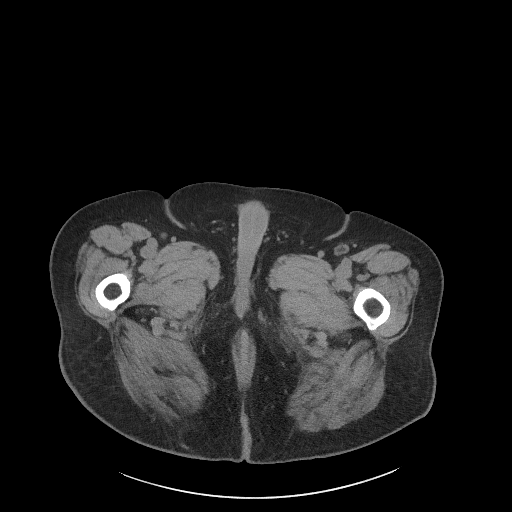
[im 5/102  bone]
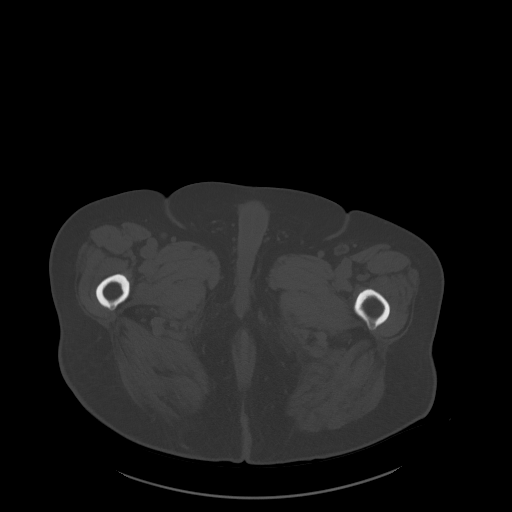
[im 13/102  soft-tissue]
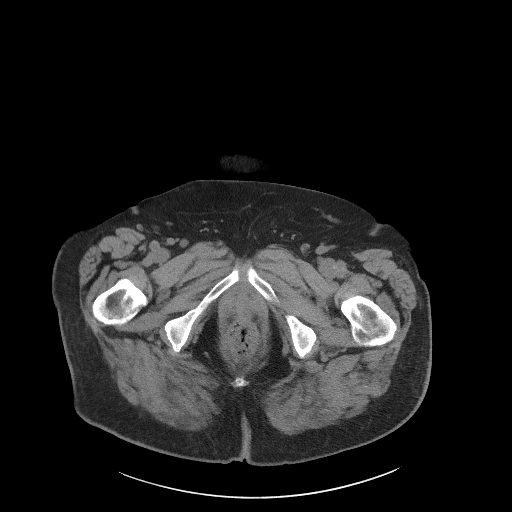
[im 21/102  soft-tissue]
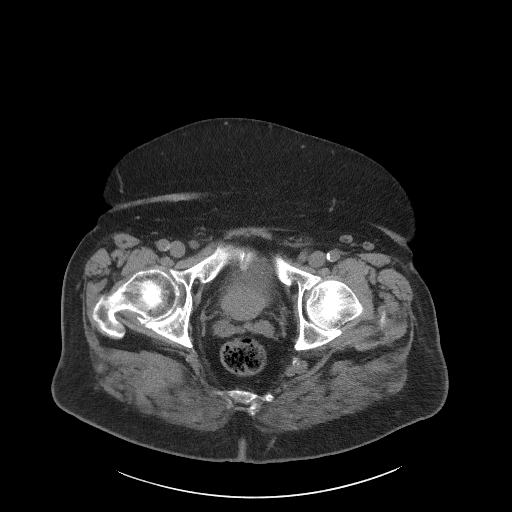
[im 29/102  soft-tissue]
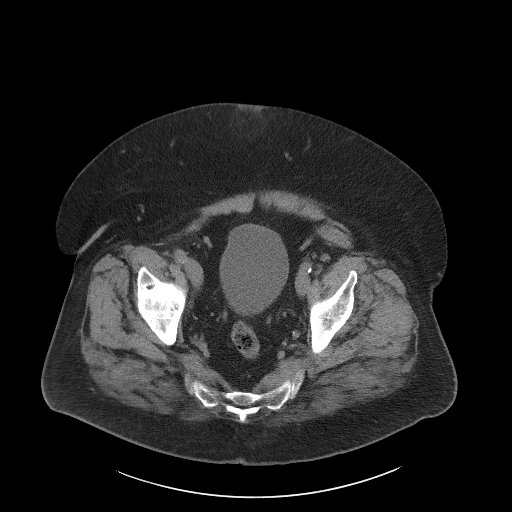
[im 37/102  soft-tissue]
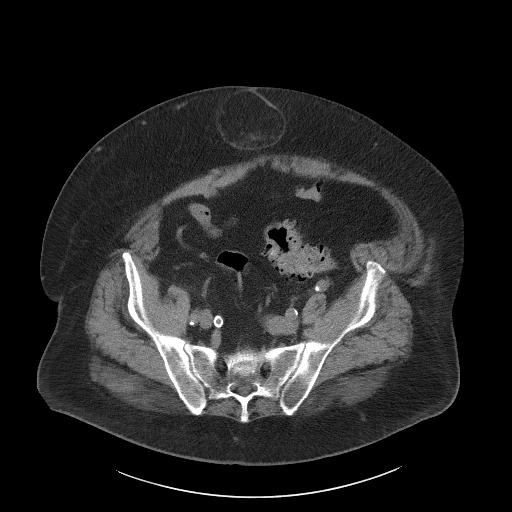
[im 45/102  soft-tissue]
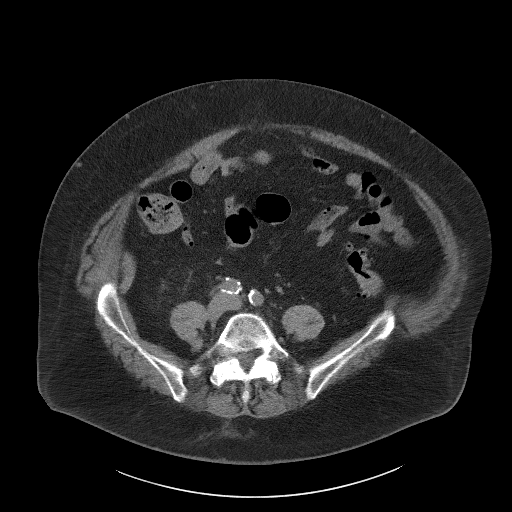
[im 53/102  soft-tissue]
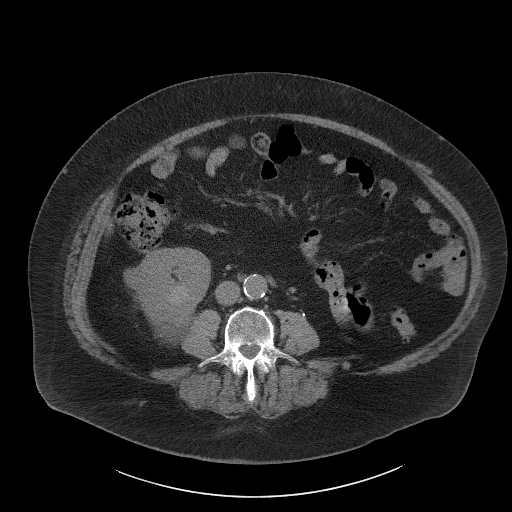
[im 57/102  soft-tissue]
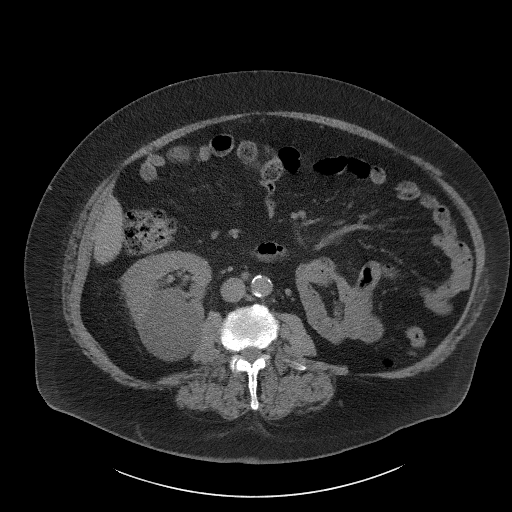
[im 65/102  soft-tissue]
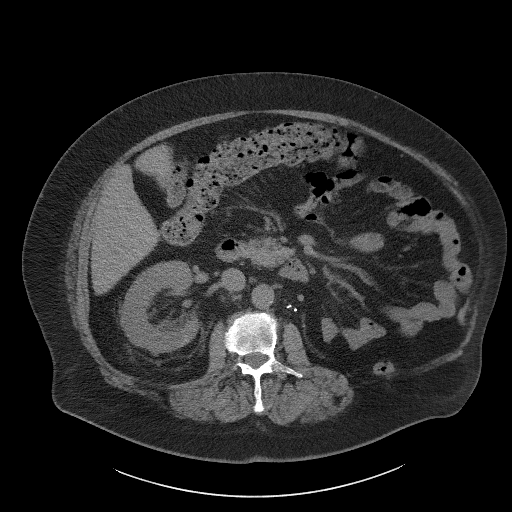
[im 65/102  bone]
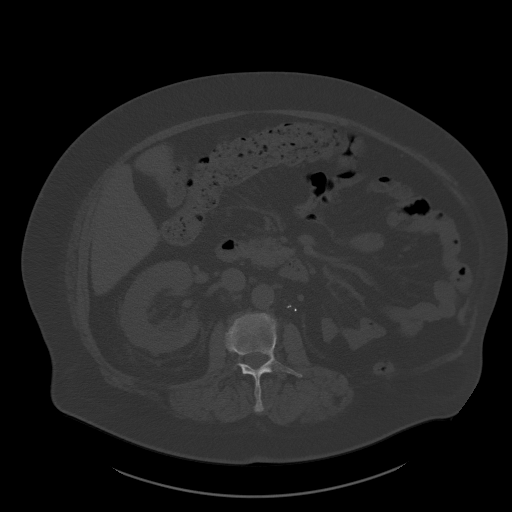
[im 73/102  soft-tissue]
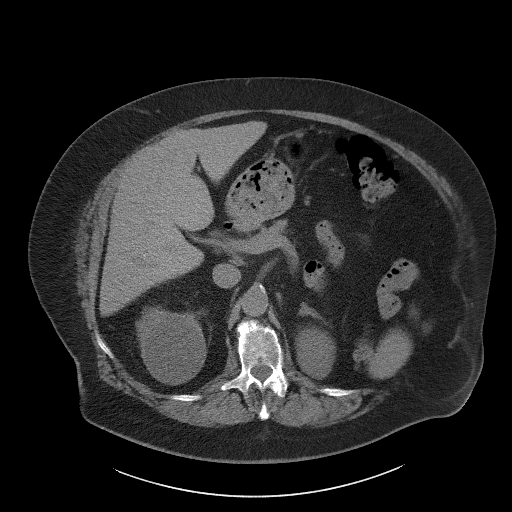
[im 81/102  soft-tissue]
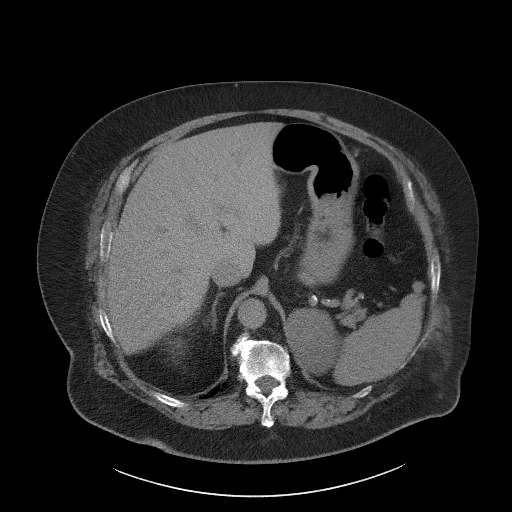
[im 89/102  soft-tissue]
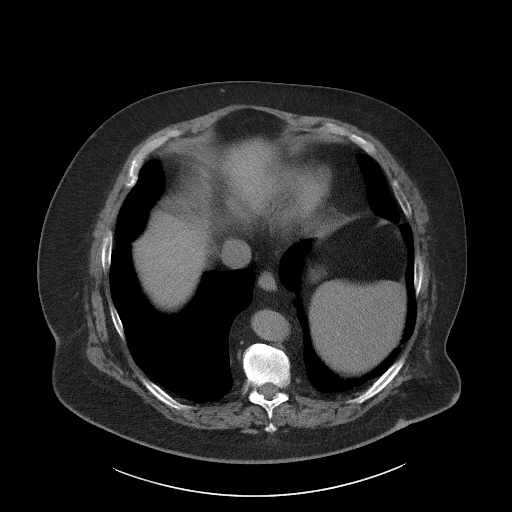
[im 97/102  soft-tissue]
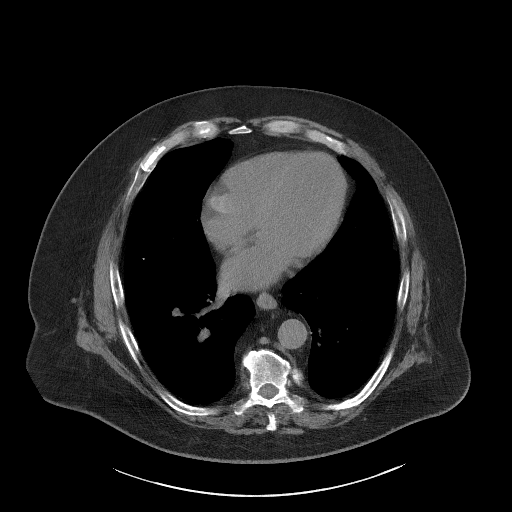

[Series 5: coronal · coronal · 0.96mm/px · 3 of 184 slices shown]
[im 62/184  soft-tissue]
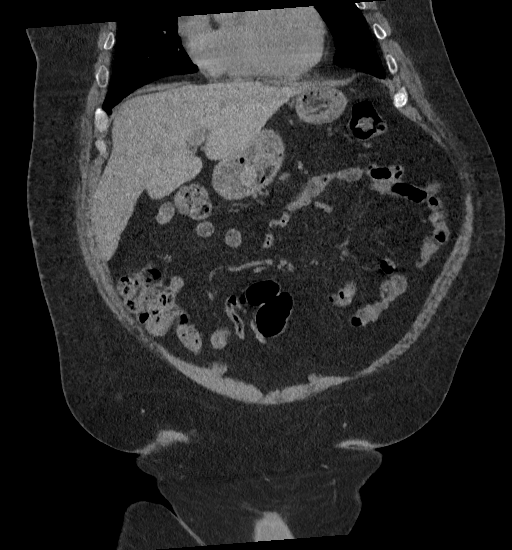
[im 82/184  soft-tissue]
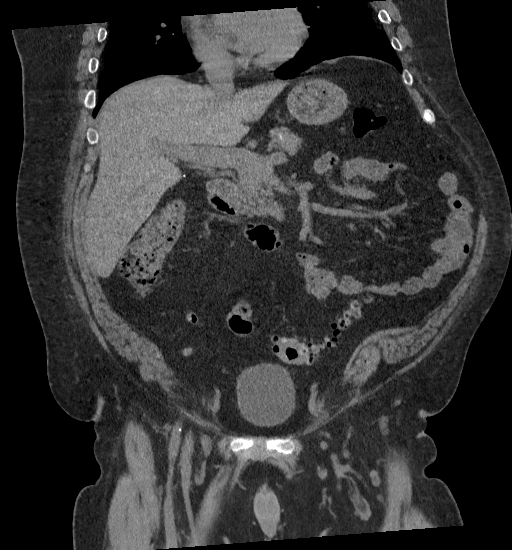
[im 102/184  soft-tissue]
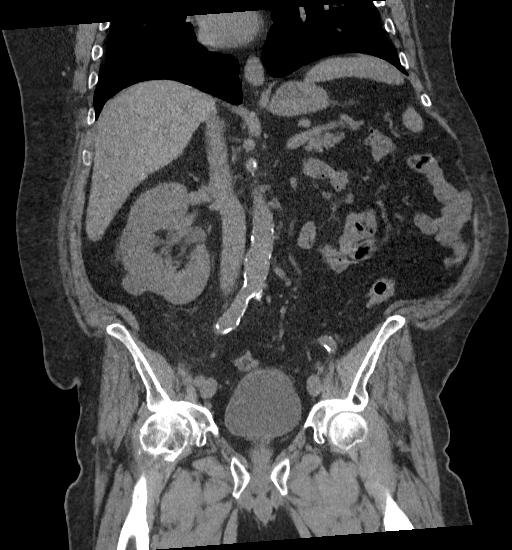

[16 of 46 positions shown; findings below may reference images not displayed]

FINDINGS: Lower chest: Lung bases demonstrate no acute consolidation or
effusion.

Hepatobiliary: Status post cholecystectomy. No focal hepatic
abnormality or biliary dilatation.

Pancreas: Unremarkable. No pancreatic ductal dilatation or
surrounding inflammatory changes.

Spleen: Normal in size without focal abnormality.

Adrenals/Urinary Tract: Right adrenal gland is normal. 7.6 x 5.2 cm
left adrenal mass, previously 7.2 x 5.5 cm. Status post left
nephrectomy. Multiple cysts in the right kidney. Now evident are
small hyperdense foci within the right kidney, the largest is seen
in the lower pole and measures 18 mm. No hydronephrosis or ureteral
stone. The bladder is unremarkable

Stomach/Bowel: Stomach is within normal limits. Appendix appears
normal. No evidence of bowel wall thickening, distention, or
inflammatory changes. Left colon diverticular disease without acute
inflammatory change.

Vascular/Lymphatic: Marked aortic atherosclerosis without aneurysm.
No suspicious nodes.

Reproductive: Prostate slightly enlarged with mass effect on the
bladder.

Other: No free air or free fluid. Moderate fat containing umbilical
hernia.

Musculoskeletal: No acute or suspicious osseous abnormality.
IMPRESSION: 1. Negative for hydronephrosis or ureteral stone. Multiple right
renal cysts. Now evident are small hyperdense foci within the right
kidney, the largest measuring up to 18 mm, possibly representing
hemorrhagic or proteinaceous cyst though further characterisation
limited without intravenous contrast. When the patient is clinically
stable and able to follow directions and hold their breath
(preferably as an outpatient) further evaluation with dedicated
abdominal MRI should be considered.
2. Status post left nephrectomy. Probably stable left adrenal mass
since 05/11/2019, now measuring 7.6 cm, but increased in size since
prior study from 3814. Surgical consultation previously suggested.
3. Left colon diverticular disease without acute inflammatory
change.
4. Moderate fat containing umbilical hernia.

Aortic Atherosclerosis ([id number]).

## 2021-08-02 IMAGING — DX DG CHEST 1V PORT
1 series · 1 of 1 positions shown · non-contrast
Comparison: December 03, 2019 chest radiograph and chest CT November 19, 2019

CLINICAL DATA: Chest pain and shortness of breath

EXAM:
PORTABLE CHEST 1 VIEW

[chest ap]
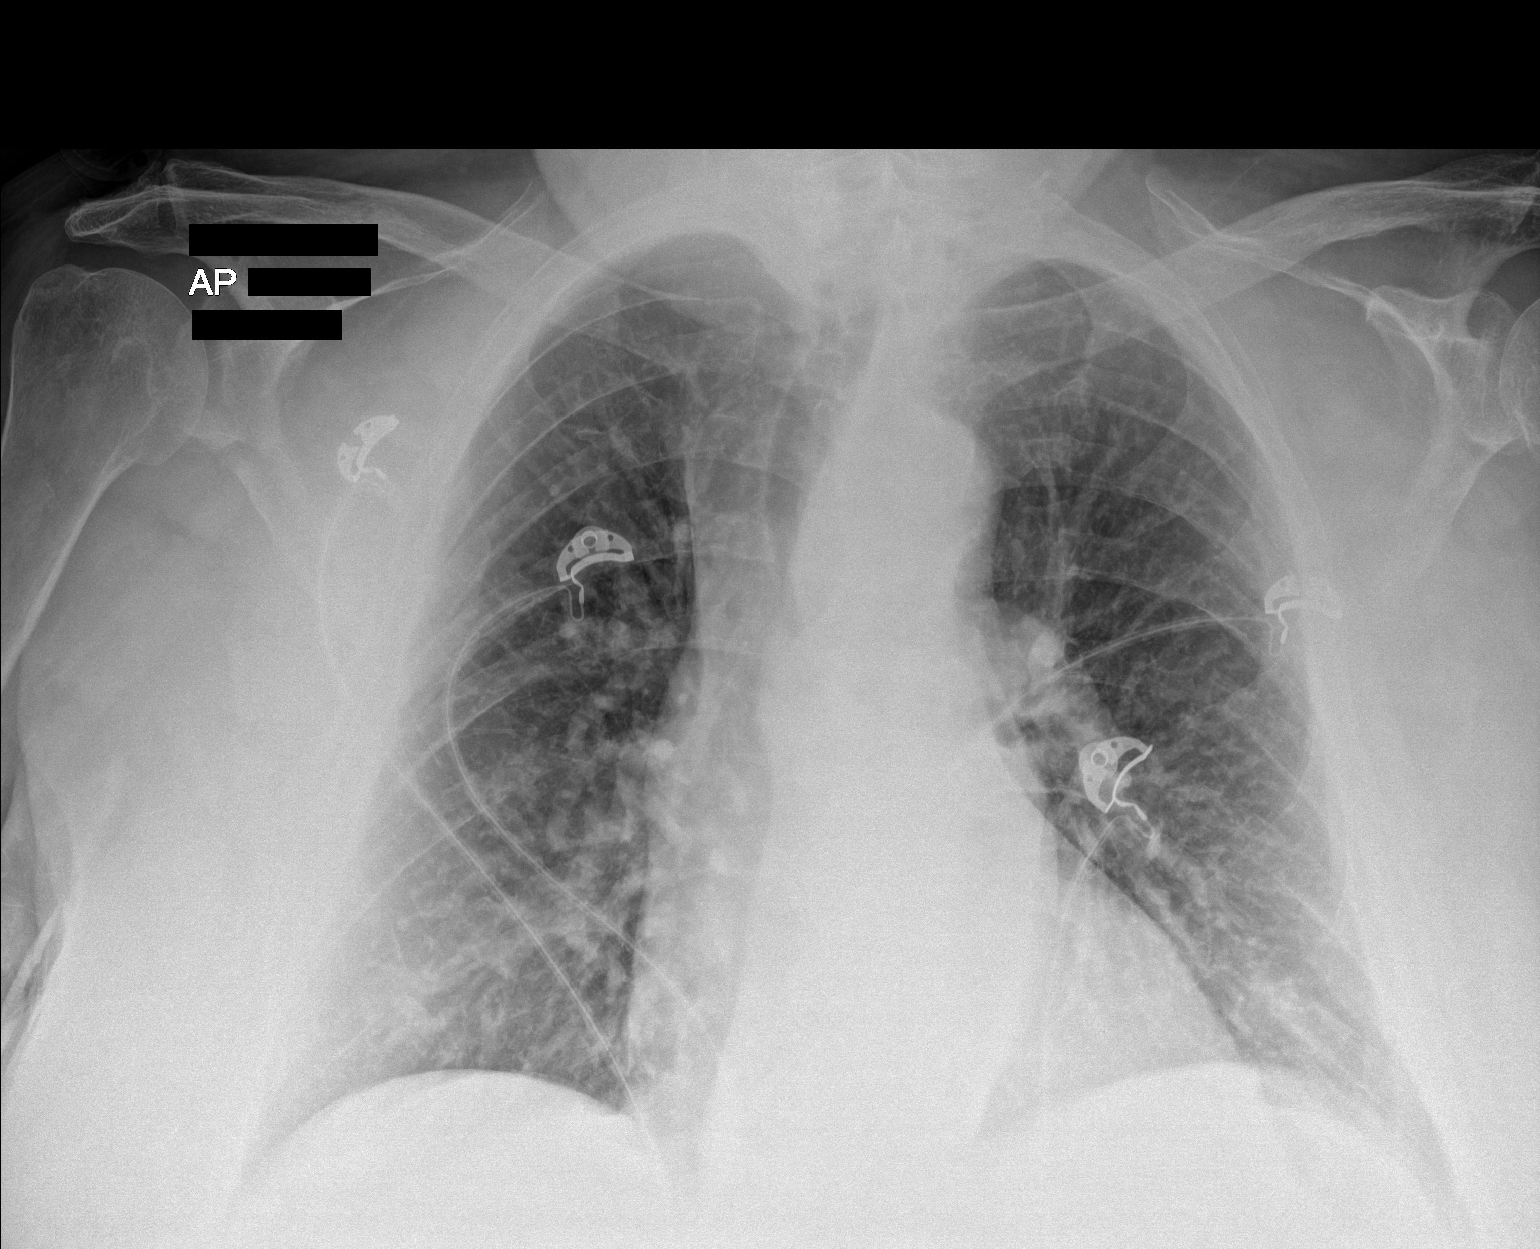

[1 of 1 positions shown; findings below may reference images not displayed]

FINDINGS: No edema or airspace opacity. Mild chronic interstitial thickening
is stable. Heart size and pulmonary vascular normal. No adenopathy.
No bone lesions.
IMPRESSION: Mild chronic interstitial thickening is stable. No edema or airspace
opacity. Cardiac silhouette normal.

## 2021-08-04 ENCOUNTER — Other Ambulatory Visit: Payer: Self-pay

## 2021-08-04 ENCOUNTER — Ambulatory Visit: Payer: Medicare PPO | Admitting: Student-PharmD

## 2021-08-04 DIAGNOSIS — I5022 Chronic systolic (congestive) heart failure: Secondary | ICD-10-CM

## 2021-08-04 DIAGNOSIS — J449 Chronic obstructive pulmonary disease, unspecified: Secondary | ICD-10-CM

## 2021-08-04 NOTE — Progress Notes (Signed)
Follow Up Pharmacist Visit  Larry Callahan, Larry Callahan [id number] years, Male  DOB: 02/22/1956  M: [id number]  Clinical Summary Patient Risk: High Next CCM Follow Up: 12/08/21 Next AWV: 03/29/22 Next PCP Visit: 08/11/21 Summary for PCP: Patient presents via telephone. Has no healthcare concerns at this time. Endorses adherence to all medications. Encouraged patient to check his blood pressure regularly and to check daily weights. Did remind patient that he has a $15 copay for visits (phone or in person) with me and he consented to care.  Patient Chart Prep  Completed by Charlann Lange on 07/30/2021  Chronic Conditions Patient's Chronic Conditions: Hypertension (HTN), Diastolic Heart Failure / HFpEF, Cardiovascular Disease (CVD), Allergic rhinitis, Chronic Obstructive Pulmonary Disease (COPD), Diabetes (DM), Chronic Kidney Disease (CKD), Hyperlipidemia/Dyslipidemia (HLD)  Doctor and Hospital Visits Were there PCP Visits since last visit with the Pharmacist?: Yes Visit #1: 07/14/21 [name], NP. For Elevated serum creatinine. CHANGED Potassium Chloride Crys ER 20 MEQ to 2 times daily. Were there Specialist Visits since last visit with the Pharmacist?: No Was there a Hospital Visit in last 30 days?: No Were there other Hospital Visits since last visit with the Pharmacist?: No  Medication Information Have there been any medication changes from PCP or Specialist since last visit with the Pharmacist?: No Are there any Medication adherence gaps (beyond 5 days past due)?: No Medication adherence rates for the STAR rating drugs: Rosuvastatin 10 mg 06/21/21 90 DS Glimepiride 2 mg 07/15/21 90 DS List Patient's current Care Gaps: Need Eye Exam Documented in EHR or by Claim  Disease Assessments  Subjective Information Current BP: 164/102 Current HR: 73 taken on: 07/14/2021 Weight: 264 BMI: 36.93 Last GFR: 28 taken on: 07/15/2021 Why did the patient present?: CCM F/U  Visit  Chronic Obstructive Pulmonary Disease (COPD) Current Eosinophils: 3 taken on: 06/28/2021 Assess this condition today?: Yes Exacerbations since last visit with pharmacist?: Yes Was the patient's maintenance therapy adjusted to help prevent future exacerbations?: Yes Details: symbicort switched to breztri Has there been change in patients smoking/vaping habit since last visit with pharmacist?: No Frequency of SABA/SAMA use: Daily We discussed: Inhaler technique Assessment:: Controlled Drug: Breztri 2 puffs twice daily Assessment: Appropriate, Effective, Safe, Accessible Drug: Duoneb daily as needed Assessment: Appropriate, Effective, Safe, Accessible Plan to: Continue medication therapy HC Follow up: none Pharmacist Follow up: 4 months  Heart Failure  Last BNP: 66.3 taken on: 06/27/2021 Assess this condition today?: Yes Reviewed last eGFR/SCr: Yes Reviewed last Potassium lab: Yes NYHA Class: II (slight limitation of activity) Patient checking daily weights?: No Why is patient not weighing?: patient has a scale that needs new batteries We discussed: Sodium restriction, Avoidance of potassium supplements Assessment:: Controlled Drug: Amlodipine 91m 1 daily Assessment: Appropriate, Effective, Safe, Accessible Drug: Carvedilol 12.534mtwice daily Assessment: Appropriate, Effective, Safe, Accessible Drug: Chlorthalidone 2570m tab daily Assessment: Appropriate, Effective, Safe, Accessible Drug: Lasix 34m43mqd Assessment: Appropriate, Effective, Safe, Accessible Drug: Hydralazine 100mg18mhree times daily Assessment: Appropriate, Effective, Safe, Accessible Additional Info: Patient has a bp device at home that he uses regularly and also has a nurse coming out to check weights and BPs. Instructed patient to notify office if he has a weight gain of more than 3 lbs in 1 day or 5 lbs in 1 week. Plan to: Continue medication therapy Pharmacist Follow up: 4 months  Exercise,  Diet and Non-Drug Coordination Needs Additional exercise counseling points. We discussed: targeting at least 151 minutes per week of moderate-intensity aerobic exercise.,  decreasing sedentary behavior Additional diet counseling points. We discussed: key components of the DASH diet Discussed Non-Drug Care Coordination Needs: Yes Does Patient have Medication financial barriers?: No  COPD and Physical Activity Chronic obstructive pulmonary disease (COPD) is a long-term, or chronic, condition that affects the lungs. COPD is a general term that can be used to describe many problems that cause inflammation of the lungs and limit airflow. These conditions include chronic bronchitis and emphysema. The main symptom of COPD is shortness of breath, which makes it harder to do even simple tasks. This can also make it harder to exercise and stay active. Talk with your health care provider about treatments to help you breathe better and actions you can take to prevent breathing problems during physical activity. What are the benefits of exercising when you have COPD? Exercising regularly is an important part of a healthy lifestyle. You can still exercise and do physical activities even though you have COPD. Exercise and physical activity improve your shortness of breath by increasing blood flow (circulation). This causes your heart to pump more oxygen through your body. Moderate exercise can: Improve oxygen use. Increase your energy level. Help with shortness of breath. Strengthen your breathing muscles. Improve heart health. Help with sleep. Improve your self-esteem and feelings of self-worth. Lower depression, stress, and anxiety. Exercise can benefit everyone with COPD. The severity of your disease may affect how hard you can exercise, especially at first, but everyone can benefit. Talk with your health care provider about how much exercise is safe for you, and which activities and exercises are safe for  you. What actions can I take to prevent breathing problems during physical activity? Sign up for a pulmonary rehabilitation program. This type of program may include: Education about lung diseases. Exercise classes that teach you how to exercise and be more active while improving your breathing. This usually involves: Exercise using your lower extremities, such as a stationary bicycle. About 30 minutes of exercise, 2 to 5 times per week, for 6 to 12 weeks. Strength training, such as push-ups or leg lifts. Nutrition education. Group classes in which you can talk with others who also have COPD and learn ways to manage stress. If you use an oxygen tank, you should use it while you exercise. Work with your health care provider to adjust your oxygen for your physical activity. Your resting flow rate is different from your flow rate during physical activity. How to manage your breathing while exercising While you are exercising: Take slow breaths. Pace yourself, and do nottry to go too fast. Purse your lips while breathing out. Pursing your lips is similar to a kissing or whistling position. If doing exercise that uses a quick burst of effort, such as weight lifting: Breathe in before starting the exercise. Breathe out during the hardest part of the exercise, such as raising the weights. Where to find support You can find support for exercising with COPD from: Your health care provider. A pulmonary rehabilitation program. Your local health department or community health programs. Support groups, either online or in-person. Your health care provider may be able to recommend support groups. Where to find more information You can find more information about exercising with COPD from: American Lung Association: lung.org COPD Foundation: copdfoundation.org Contact a health care provider if: Your symptoms get worse. You have nausea. You have a fever. You want to start a new exercise program or a  new activity. Get help right away if: You have chest pain. You  cannot breathe. These symptoms may represent a serious problem that is an emergency. Do not wait to see if the symptoms will go away. Get medical help right away. Call your local emergency services (911 in the [place] Do not drive yourself to the hospital. Summary COPD is a general term that can be used to describe many different lung problems that cause lung inflammation and limit airflow. This includes chronic bronchitis and emphysema. Exercise and physical activity improve your shortness of breath by increasing blood flow (circulation). This causes your heart to provide more oxygen to your body. Contact your health care provider before starting any exercise program or new activity. Ask your health care provider what exercises and activities are safe for you. This information is not intended to replace advice given to you by your health care provider. Make sure you discuss any questions you have with your health care provider.       Engagement Notes HC Chart Review: 15 min 07/30/21  CP Chart Prep: 12 min 07/27/21 CP Office visit: 22 min 08/04/21 CP Office visit documentation: 30 min 08/04/21  Alena Bills  Clinical Pharmacist [phone]

## 2021-08-05 ENCOUNTER — Inpatient Hospital Stay
Admission: EM | Admit: 2021-08-05 | Discharge: 2021-08-08 | DRG: 291 | Disposition: A | Discharge status: Home / Self Care | Payer: Medicare PPO | Attending: Internal Medicine | Admitting: Internal Medicine

## 2021-08-05 ENCOUNTER — Other Ambulatory Visit: Payer: Self-pay

## 2021-08-05 ENCOUNTER — Encounter: Payer: Self-pay | Admitting: Emergency Medicine

## 2021-08-05 ENCOUNTER — Emergency Department: Payer: Medicare PPO

## 2021-08-05 DIAGNOSIS — G4733 Obstructive sleep apnea (adult) (pediatric): Secondary | ICD-10-CM | POA: Diagnosis present

## 2021-08-05 DIAGNOSIS — R0689 Other abnormalities of breathing: Secondary | ICD-10-CM | POA: Diagnosis not present

## 2021-08-05 DIAGNOSIS — K219 Gastro-esophageal reflux disease without esophagitis: Secondary | ICD-10-CM | POA: Diagnosis present

## 2021-08-05 DIAGNOSIS — R262 Difficulty in walking, not elsewhere classified: Secondary | ICD-10-CM | POA: Diagnosis present

## 2021-08-05 DIAGNOSIS — Z9049 Acquired absence of other specified parts of digestive tract: Secondary | ICD-10-CM

## 2021-08-05 DIAGNOSIS — I110 Hypertensive heart disease with heart failure: Secondary | ICD-10-CM | POA: Diagnosis not present

## 2021-08-05 DIAGNOSIS — R079 Chest pain, unspecified: Secondary | ICD-10-CM | POA: Diagnosis not present

## 2021-08-05 DIAGNOSIS — I130 Hypertensive heart and chronic kidney disease with heart failure and stage 1 through stage 4 chronic kidney disease, or unspecified chronic kidney disease: Secondary | ICD-10-CM | POA: Diagnosis not present

## 2021-08-05 DIAGNOSIS — N189 Chronic kidney disease, unspecified: Secondary | ICD-10-CM | POA: Diagnosis not present

## 2021-08-05 DIAGNOSIS — J811 Chronic pulmonary edema: Secondary | ICD-10-CM | POA: Diagnosis not present

## 2021-08-05 DIAGNOSIS — J9811 Atelectasis: Secondary | ICD-10-CM | POA: Diagnosis not present

## 2021-08-05 DIAGNOSIS — Z7901 Long term (current) use of anticoagulants: Secondary | ICD-10-CM

## 2021-08-05 DIAGNOSIS — E785 Hyperlipidemia, unspecified: Secondary | ICD-10-CM | POA: Diagnosis present

## 2021-08-05 DIAGNOSIS — E1151 Type 2 diabetes mellitus with diabetic peripheral angiopathy without gangrene: Secondary | ICD-10-CM | POA: Diagnosis present

## 2021-08-05 DIAGNOSIS — Z801 Family history of malignant neoplasm of trachea, bronchus and lung: Secondary | ICD-10-CM | POA: Diagnosis not present

## 2021-08-05 DIAGNOSIS — I509 Heart failure, unspecified: Secondary | ICD-10-CM

## 2021-08-05 DIAGNOSIS — Z20822 Contact with and (suspected) exposure to covid-19: Secondary | ICD-10-CM | POA: Diagnosis present

## 2021-08-05 DIAGNOSIS — R0609 Other forms of dyspnea: Secondary | ICD-10-CM | POA: Diagnosis not present

## 2021-08-05 DIAGNOSIS — Z880 Allergy status to penicillin: Secondary | ICD-10-CM

## 2021-08-05 DIAGNOSIS — Z8249 Family history of ischemic heart disease and other diseases of the circulatory system: Secondary | ICD-10-CM

## 2021-08-05 DIAGNOSIS — Z833 Family history of diabetes mellitus: Secondary | ICD-10-CM

## 2021-08-05 DIAGNOSIS — I480 Paroxysmal atrial fibrillation: Secondary | ICD-10-CM | POA: Diagnosis present

## 2021-08-05 DIAGNOSIS — Z79899 Other long term (current) drug therapy: Secondary | ICD-10-CM

## 2021-08-05 DIAGNOSIS — J9601 Acute respiratory failure with hypoxia: Secondary | ICD-10-CM | POA: Diagnosis not present

## 2021-08-05 DIAGNOSIS — Z794 Long term (current) use of insulin: Secondary | ICD-10-CM

## 2021-08-05 DIAGNOSIS — J441 Chronic obstructive pulmonary disease with (acute) exacerbation: Secondary | ICD-10-CM | POA: Diagnosis not present

## 2021-08-05 DIAGNOSIS — I5023 Acute on chronic systolic (congestive) heart failure: Secondary | ICD-10-CM | POA: Diagnosis not present

## 2021-08-05 DIAGNOSIS — I739 Peripheral vascular disease, unspecified: Secondary | ICD-10-CM | POA: Diagnosis present

## 2021-08-05 DIAGNOSIS — I10 Essential (primary) hypertension: Secondary | ICD-10-CM | POA: Diagnosis present

## 2021-08-05 DIAGNOSIS — I5043 Acute on chronic combined systolic (congestive) and diastolic (congestive) heart failure: Secondary | ICD-10-CM | POA: Diagnosis not present

## 2021-08-05 DIAGNOSIS — D631 Anemia in chronic kidney disease: Secondary | ICD-10-CM | POA: Diagnosis present

## 2021-08-05 DIAGNOSIS — T380X5A Adverse effect of glucocorticoids and synthetic analogues, initial encounter: Secondary | ICD-10-CM | POA: Diagnosis not present

## 2021-08-05 DIAGNOSIS — Z7984 Long term (current) use of oral hypoglycemic drugs: Secondary | ICD-10-CM

## 2021-08-05 DIAGNOSIS — N184 Chronic kidney disease, stage 4 (severe): Secondary | ICD-10-CM | POA: Diagnosis present

## 2021-08-05 DIAGNOSIS — I428 Other cardiomyopathies: Secondary | ICD-10-CM | POA: Diagnosis present

## 2021-08-05 DIAGNOSIS — R069 Unspecified abnormalities of breathing: Secondary | ICD-10-CM | POA: Diagnosis not present

## 2021-08-05 DIAGNOSIS — E1165 Type 2 diabetes mellitus with hyperglycemia: Secondary | ICD-10-CM | POA: Diagnosis present

## 2021-08-05 DIAGNOSIS — E1122 Type 2 diabetes mellitus with diabetic chronic kidney disease: Secondary | ICD-10-CM | POA: Diagnosis not present

## 2021-08-05 DIAGNOSIS — N1832 Chronic kidney disease, stage 3b: Secondary | ICD-10-CM

## 2021-08-05 DIAGNOSIS — I2510 Atherosclerotic heart disease of native coronary artery without angina pectoris: Secondary | ICD-10-CM | POA: Diagnosis present

## 2021-08-05 DIAGNOSIS — Z87891 Personal history of nicotine dependence: Secondary | ICD-10-CM

## 2021-08-05 DIAGNOSIS — R0602 Shortness of breath: Secondary | ICD-10-CM | POA: Diagnosis not present

## 2021-08-05 DIAGNOSIS — J449 Chronic obstructive pulmonary disease, unspecified: Secondary | ICD-10-CM | POA: Diagnosis not present

## 2021-08-05 DIAGNOSIS — J90 Pleural effusion, not elsewhere classified: Secondary | ICD-10-CM | POA: Diagnosis not present

## 2021-08-05 DIAGNOSIS — Z7951 Long term (current) use of inhaled steroids: Secondary | ICD-10-CM

## 2021-08-05 LAB — CBC
HCT: 30.6 % — ABNORMAL LOW (ref 39.0–52.0)
Hemoglobin: 9.4 g/dL — ABNORMAL LOW (ref 13.0–17.0)
MCH: 25.2 pg — ABNORMAL LOW (ref 26.0–34.0)
MCHC: 30.7 g/dL (ref 30.0–36.0)
MCV: 82 fL (ref 80.0–100.0)
Platelets: 226 K/uL (ref 150–400)
RBC: 3.73 MIL/uL — ABNORMAL LOW (ref 4.22–5.81)
RDW: 15.7 % — ABNORMAL HIGH (ref 11.5–15.5)
WBC: 6.9 K/uL (ref 4.0–10.5)
nRBC: 0 % (ref 0.0–0.2)

## 2021-08-05 LAB — RESP PANEL BY RT-PCR (FLU A&B, COVID) ARPGX2
Influenza A by PCR: NEGATIVE
Influenza B by PCR: NEGATIVE
SARS Coronavirus 2 by RT PCR: NEGATIVE

## 2021-08-05 LAB — BASIC METABOLIC PANEL
Anion gap: 10 (ref 5–15)
BUN: 49 mg/dL — ABNORMAL HIGH (ref 8–23)
CO2: 22 mmol/L (ref 22–32)
Calcium: 8.1 mg/dL — ABNORMAL LOW (ref 8.9–10.3)
Chloride: 101 mmol/L (ref 98–111)
Creatinine, Ser: 2.5 mg/dL — ABNORMAL HIGH (ref 0.61–1.24)
GFR, Estimated: 27 mL/min — ABNORMAL LOW (ref >60)
Glucose, Bld: 166 mg/dL — ABNORMAL HIGH (ref 70–99)
Potassium: 4.3 mmol/L (ref 3.5–5.1)
Sodium: 133 mmol/L — ABNORMAL LOW (ref 135–145)

## 2021-08-05 LAB — CBG MONITORING, ED: Glucose-Capillary: 345 mg/dL — ABNORMAL HIGH (ref 70–99)

## 2021-08-05 LAB — TROPONIN I (HIGH SENSITIVITY)
Troponin I (High Sensitivity): 14 ng/L
Troponin I (High Sensitivity): 14 ng/L

## 2021-08-05 LAB — BRAIN NATRIURETIC PEPTIDE: B Natriuretic Peptide: 72.4 pg/mL (ref 0.0–100.0)

## 2021-08-05 MED ORDER — INSULIN GLARGINE-YFGN 100 UNIT/ML ~~LOC~~ SOLN
10.0000 [IU] | Freq: Every day | SUBCUTANEOUS | Status: DC
  Administered 2021-08-06 – 2021-08-08 (×3): 10 [IU] via SUBCUTANEOUS
  Filled 2021-08-05 (×3): qty 0.1

## 2021-08-05 MED ORDER — METHYLPREDNISOLONE SODIUM SUCC 125 MG IJ SOLR
125.0000 mg | Freq: Once | INTRAMUSCULAR | Status: AC
Start: 2021-08-05 — End: 2021-08-05
  Administered 2021-08-05: 125 mg via INTRAVENOUS
  Filled 2021-08-05: qty 2

## 2021-08-05 MED ORDER — INSULIN ASPART 100 UNIT/ML IJ SOLN
0.0000 [IU] | Freq: Three times a day (TID) | INTRAMUSCULAR | Status: DC
  Administered 2021-08-05 – 2021-08-06 (×2): 7 [IU] via SUBCUTANEOUS
  Filled 2021-08-05 (×2): qty 1

## 2021-08-05 MED ORDER — CHLORTHALIDONE 25 MG PO TABS
25.0000 mg | ORAL_TABLET | Freq: Every day | ORAL | Status: DC
  Administered 2021-08-06 – 2021-08-08 (×3): 25 mg via ORAL
  Filled 2021-08-05 (×3): qty 1

## 2021-08-05 MED ORDER — IPRATROPIUM BROMIDE 0.02 % IN SOLN: 0.5000 mg | Freq: Four times a day (QID) | RESPIRATORY_TRACT | Status: DC

## 2021-08-05 MED ORDER — INSULIN GLARGINE (1 UNIT DIAL) 300 UNIT/ML ~~LOC~~ SOPN: 10.0000 [IU] | PEN_INJECTOR | Freq: Every day | SUBCUTANEOUS | Status: DC

## 2021-08-05 MED ORDER — IPRATROPIUM-ALBUTEROL 0.5-2.5 (3) MG/3ML IN SOLN
3.0000 mL | RESPIRATORY_TRACT | Status: DC
Start: 2021-08-05 — End: 2021-08-06
  Administered 2021-08-05 – 2021-08-06 (×4): 3 mL via RESPIRATORY_TRACT
  Filled 2021-08-05 (×5): qty 3

## 2021-08-05 MED ORDER — FUROSEMIDE 10 MG/ML IJ SOLN
60.0000 mg | Freq: Once | INTRAMUSCULAR | Status: AC
  Administered 2021-08-05: 60 mg via INTRAVENOUS
  Filled 2021-08-05: qty 8

## 2021-08-05 MED ORDER — ROSUVASTATIN CALCIUM 10 MG PO TABS
10.0000 mg | ORAL_TABLET | Freq: Every day | ORAL | Status: DC
  Administered 2021-08-05 – 2021-08-07 (×3): 10 mg via ORAL
  Filled 2021-08-05 (×4): qty 1

## 2021-08-05 MED ORDER — FUROSEMIDE 10 MG/ML IJ SOLN
40.0000 mg | Freq: Two times a day (BID) | INTRAMUSCULAR | Status: DC
  Administered 2021-08-06 – 2021-08-08 (×5): 40 mg via INTRAVENOUS
  Filled 2021-08-05 (×5): qty 4

## 2021-08-05 MED ORDER — BUDESONIDE 0.25 MG/2ML IN SUSP
0.2500 mg | Freq: Two times a day (BID) | RESPIRATORY_TRACT | Status: DC
  Administered 2021-08-05 – 2021-08-08 (×6): 0.25 mg via RESPIRATORY_TRACT
  Filled 2021-08-05 (×6): qty 2

## 2021-08-05 MED ORDER — ACETAMINOPHEN 325 MG PO TABS: 650.0000 mg | ORAL_TABLET | Freq: Four times a day (QID) | ORAL | Status: DC | PRN

## 2021-08-05 MED ORDER — AMLODIPINE BESYLATE 10 MG PO TABS
10.0000 mg | ORAL_TABLET | Freq: Every day | ORAL | Status: DC
  Administered 2021-08-06 – 2021-08-08 (×3): 10 mg via ORAL
  Filled 2021-08-05 (×2): qty 1
  Filled 2021-08-05: qty 2

## 2021-08-05 MED ORDER — HYDRALAZINE HCL 50 MG PO TABS
100.0000 mg | ORAL_TABLET | Freq: Three times a day (TID) | ORAL | Status: DC
Start: 2021-08-05 — End: 2021-08-08
  Administered 2021-08-05 – 2021-08-08 (×9): 100 mg via ORAL
  Filled 2021-08-05 (×9): qty 2

## 2021-08-05 MED ORDER — ALBUTEROL SULFATE (2.5 MG/3ML) 0.083% IN NEBU: 2.5000 mg | INHALATION_SOLUTION | Freq: Four times a day (QID) | RESPIRATORY_TRACT | Status: DC

## 2021-08-05 MED ORDER — METHYLPREDNISOLONE SODIUM SUCC 40 MG IJ SOLR
40.0000 mg | Freq: Two times a day (BID) | INTRAMUSCULAR | Status: DC
  Administered 2021-08-06 – 2021-08-08 (×5): 40 mg via INTRAVENOUS
  Filled 2021-08-05 (×5): qty 1

## 2021-08-05 MED ORDER — INSULIN ASPART 100 UNIT/ML IJ SOLN: 0.0000 [IU] | Freq: Three times a day (TID) | INTRAMUSCULAR | Status: DC

## 2021-08-05 MED ORDER — ALBUTEROL SULFATE (2.5 MG/3ML) 0.083% IN NEBU: 2.5000 mg | INHALATION_SOLUTION | RESPIRATORY_TRACT | Status: DC | PRN

## 2021-08-05 MED ORDER — GABAPENTIN 100 MG PO CAPS
100.0000 mg | ORAL_CAPSULE | Freq: Three times a day (TID) | ORAL | Status: DC
Start: 2021-08-05 — End: 2021-08-08
  Administered 2021-08-05 – 2021-08-08 (×9): 100 mg via ORAL
  Filled 2021-08-05 (×9): qty 1

## 2021-08-05 MED ORDER — APIXABAN 2.5 MG PO TABS
2.5000 mg | ORAL_TABLET | Freq: Two times a day (BID) | ORAL | Status: DC
  Administered 2021-08-05 – 2021-08-08 (×6): 2.5 mg via ORAL
  Filled 2021-08-05 (×7): qty 1

## 2021-08-05 MED ORDER — IPRATROPIUM-ALBUTEROL 0.5-2.5 (3) MG/3ML IN SOLN
3.0000 mL | Freq: Once | RESPIRATORY_TRACT | Status: AC
  Administered 2021-08-05: 3 mL via RESPIRATORY_TRACT
  Filled 2021-08-05: qty 3

## 2021-08-05 MED ORDER — ACETAMINOPHEN 650 MG RE SUPP: 650.0000 mg | Freq: Four times a day (QID) | RECTAL | Status: DC | PRN

## 2021-08-05 MED ORDER — CARVEDILOL 12.5 MG PO TABS
12.5000 mg | ORAL_TABLET | Freq: Two times a day (BID) | ORAL | Status: DC
  Administered 2021-08-05 – 2021-08-08 (×6): 12.5 mg via ORAL
  Filled 2021-08-05: qty 2
  Filled 2021-08-05 (×3): qty 1
  Filled 2021-08-05: qty 2
  Filled 2021-08-05: qty 1

## 2021-08-05 NOTE — ED Triage Notes (Signed)
Per EMS pt with increased shob and chest pain since yesterday.  Does not wear home O2 except on his cpap at night. Hx of copd.  Does not smoke.  States his inhalers were recently changed and "arent working"

## 2021-08-05 NOTE — H&P (Signed)
History and Physical    Tommey Barret Follansbee PQZ:[id number] DOB: 1955-12-03 DOA: 08/05/2021  PCP: [name], NP  Patient coming from: Home.  Chief Complaint: Shortness of breath.  HPI: Larry Callahan is a 66 y.o. male with history of nonischemic cardiomyopathy last EF measured in April 2022 was 9 to 40%, COPD, sleep apnea, chronic kidney disease stage IV, chronic anemia, hypertension, diabetes mellitus presents to the ER because of worsening shortness of breath over the last 2 days.  Patient states he has been having difficulty breathing increasing on exertion with at times orthopnea.  Today while he was shopping he felt very short of breath with some chest pressure decided to come to the ER.  Denies any productive cough fever or chills.  ED Course: In the ER patient was found to have mild wheezing and chest x-ray is compatible with bilateral pleural effusion and congestion.  Patient was given Lasix 60 mg IV and also nebulizer treatment and steroids admitted for further work-up.  COVID test was negative.  Labs show mild worsening of his anemia.  EKG showing sinus rhythm with tachycardia at times monitor showing A-fib with RVR.  Review of Systems: As per HPI, rest all negative.   Past Medical History:  Diagnosis Date   Allergy    Chronic combined systolic (congestive) and diastolic (congestive) heart failure ([place])    a. 04/2019 Echo: EF 45-50%; b. 02/2019 Echo: EF 55-60%; c. 09/2020 Echo: EF 35-40%, glob HK. Nl RV size/fxn. Mild MR.   CKD (chronic kidney disease), stage III (HCC)    COPD (chronic obstructive pulmonary disease) ([place])    Coronary artery disease    a. 2011 Cath: nonobs dzs; b. 09/2020 MV: EF 38%. No signif ischemia. ? inf MI vs diaph attenuation. GI uptake artifact.   Diabetes mellitus without complication ([place])    GERD (gastroesophageal reflux disease)    Hyperlipidemia    Hypertension    Morbid obesity ([place])    NICM (nonischemic cardiomyopathy) ([place])     a. 04/2019 Echo: EF 45-50%; b. 02/2019 Echo: EF 55-60%; c. 09/2020 Echo: EF 35-40%, glob HK; d. 09/2020 MV: No ischemia. ? inf infarct vs attenuation.   OSA (obstructive sleep apnea)     Past Surgical History:  Procedure Laterality Date   BACK SURGERY     CARDIAC CATHETERIZATION     CHOLECYSTECTOMY     COLONOSCOPY WITH PROPOFOL N/A 04/12/2019   Procedure: COLONOSCOPY WITH PROPOFOL;  Surgeon: [name], MD;  Location: [facility] ENDOSCOPY;  Service: Gastroenterology;  Laterality: N/A;   ESOPHAGOGASTRODUODENOSCOPY N/A 04/12/2019   Procedure: ESOPHAGOGASTRODUODENOSCOPY (EGD);  Surgeon: [name], MD;  Location: [facility] ENDOSCOPY;  Service: Gastroenterology;  Laterality: N/A;   ESOPHAGOGASTRODUODENOSCOPY (EGD) WITH PROPOFOL N/A 11/21/2019   Procedure: ESOPHAGOGASTRODUODENOSCOPY (EGD) WITH PROPOFOL;  Surgeon: [name], MD;  Location: [facility] ENDOSCOPY;  Service: Gastroenterology;  Laterality: N/A;   FLEXIBLE BRONCHOSCOPY Bilateral 05/17/2019   Procedure: FLEXIBLE BRONCHOSCOPY;  Surgeon: [name], MD;  Location: [name] ORS;  Service: Pulmonary;  Laterality: Bilateral;   left arm surgery     nephrectomy Left    PARATHYROIDECTOMY     RIGHT HEART CATH N/A 03/29/2019   Procedure: RIGHT HEART CATH;  Surgeon: [name], MD;  Location: [place] CV LAB;  Service: Cardiovascular;  Laterality: N/A;     reports that he has quit smoking. His smoking use included cigarettes. He has quit using smokeless tobacco.  His smokeless tobacco use included chew. He reports that he does  not drink alcohol and does not use drugs.  Allergies  Allergen Reactions   Penicillins Rash    Family History  Problem Relation Age of Onset   Diabetes Mother        2003   Lung cancer Father    Diabetes Sister    Hypertension Sister    Heart disease Sister    Cancer Sister    Bone cancer Brother     Prior to Admission medications   Medication Sig Start Date End Date Taking? Authorizing  Provider  amLODipine (NORVASC) 10 MG tablet Take 1 tablet (10 mg total) by mouth daily. 03/23/21   [name], NP  apixaban (ELIQUIS) 2.5 MG TABS tablet Take 1 tablet (2.5 mg total) by mouth 2 (two) times daily. 05/20/21   [name], NP  Blood Glucose Calibration (TRUE METRIX LEVEL 1) Low SOLN Use as directed 04/04/18   [name], NP  Blood Glucose Monitoring Suppl (TRUE METRIX AIR GLUCOSE METER) DEVI 1 Device by Does not apply route 2 (two) times daily. 04/13/18   [name], NP  Budeson-Glycopyrrol-Formoterol (BREZTRI AEROSPHERE) 160-9-4.8 MCG/ACT AERO Inhale 2 puffs into the lungs in the morning and at bedtime. 07/21/21   [name], NP  budesonide (PULMICORT) 0.25 MG/2ML nebulizer solution Take 2 mLs (0.25 mg total) by nebulization daily for 7 days. 06/29/21 07/06/21  Danford, Suann Larry, MD  carvedilol (COREG) 12.5 MG tablet Take 1 tablet (12.5 mg total) by mouth 2 (two) times daily. 01/07/21 01/07/22  Arta Silence, MD  cetirizine (ZYRTEC) 10 MG tablet Take 10 mg by mouth daily.    [provider]  chlorthalidone (HYGROTON) 25 MG tablet Take 1 tablet (25 mg total) by mouth daily. 07/13/21   [name], NP  clindamycin (CLINDAGEL) 1 % gel Apply to the affected area two times daily until resolved. Then use as needed for flare ups 05/20/21   [name], NP  furosemide (LASIX) 40 MG tablet Take 1 tablet (40 mg total) by mouth daily. 07/13/21   [name], NP  gabapentin (NEURONTIN) 100 MG capsule Take 1 capsule (100 mg total) by mouth in the morning, at noon, and at bedtime. 02/23/21   [name], NP  glimepiride (AMARYL) 2 MG tablet Take one tab po qd with supper 02/23/21   [name], NP  glucose blood (TRUE METRIX BLOOD GLUCOSE TEST) test strip Use as instructed twice daily diag E11.65 07/30/19   [name], NP  hydrALAZINE (APRESOLINE) 100 MG tablet Take 1 tablet (100 mg total) by mouth 3 (three) times daily.  02/23/21   [name], NP  insulin glargine, 1 Unit Dial, (TOUJEO) 300 UNIT/ML Solostar Pen Inject 10 Units into the skin daily. Reduced from prior 26 units daily. 03/07/21   Enzo Bi, MD  ipratropium-albuterol (DUONEB) 0.5-2.5 (3) MG/3ML SOLN Take 3 mLs by nebulization every 6 (six) hours as needed. 06/29/21   Danford, Suann Larry, MD  meclizine (ANTIVERT) 25 MG tablet Take 1 tablet (25 mg total) by mouth 3 (three) times daily as needed for dizziness or nausea. 11/25/20   Paulette Blanch, MD  NOVOFINE PEN NEEDLE 32G X 6 MM MISC Use as directed 10/15/20   Lavera Guise, MD  omeprazole (PRILOSEC) 40 MG capsule Take 1 capsule (40 mg total) by mouth in the morning and at bedtime. 05/12/21   [name], NP  potassium chloride SA (KLOR-CON M) 20 MEQ tablet Take 1 tablet (20 mEq total) by mouth 2 (two) times daily. 07/14/21  [name], NP  rosuvastatin (CRESTOR) 10 MG tablet Take 1 tablet (10 mg total) by mouth at bedtime. 02/23/21   [name], NP  TRUEplus Lancets 33G MISC Use as directed twice daily diag E11.65 07/30/19   [name], NP  vitamin B-12 1000 MCG tablet Take 1 tablet (1,000 mcg total) by mouth daily. 01/16/21   Fritzi Mandes, MD    Physical Exam: Constitutional: Moderately built and nourished. Vitals:   08/05/21 1615 08/05/21 1630 08/05/21 1645 08/05/21 1700  BP:  (!) 171/79  (!) 150/86  Pulse: (!) 103 (!) 104 (!) 103 65  Resp: (!) 35 (!) 35 (!) 23 (!) 36  Temp:      TempSrc:      SpO2: 97% 96% 100% 95%  Weight:      Height:       Eyes: Anicteric no pallor. ENMT: No discharge from the ears eyes nose and mouth. Neck: JVD elevated no mass felt. Respiratory: Mild expiratory wheeze and no crepitations. Cardiovascular: S1-S2 heard. Abdomen: Soft nontender bowel sounds present. Musculoskeletal: No edema. Skin: No rash. Neurologic: Alert awake oriented time place and person.  Moves all extremities. Psychiatric: Appears normal.  Normal affect.   Labs on  Admission: I have personally reviewed following labs and imaging studies  CBC: Recent Labs  Lab 08/05/21 1554  WBC 6.9  HGB 9.4*  HCT 30.6*  MCV 82.0  PLT 397   Basic Metabolic Panel: Recent Labs  Lab 08/05/21 1554  NA 133*  K 4.3  CL 101  CO2 22  GLUCOSE 166*  BUN 49*  CREATININE 2.50*  CALCIUM 8.1*   GFR: Estimated Creatinine Clearance: 37.2 mL/min (A) (by C-G formula based on SCr of 2.5 mg/dL (H)). Liver Function Tests: No results for input(s): AST, ALT, ALKPHOS, BILITOT, PROT, ALBUMIN in the last 168 hours. No results for input(s): LIPASE, AMYLASE in the last 168 hours. No results for input(s): AMMONIA in the last 168 hours. Coagulation Profile: No results for input(s): INR, PROTIME in the last 168 hours. Cardiac Enzymes: No results for input(s): CKTOTAL, CKMB, CKMBINDEX, TROPONINI in the last 168 hours. BNP (last 3 results) No results for input(s): PROBNP in the last 8760 hours. HbA1C: No results for input(s): HGBA1C in the last 72 hours. CBG: No results for input(s): GLUCAP in the last 168 hours. Lipid Profile: No results for input(s): CHOL, HDL, LDLCALC, TRIG, CHOLHDL, LDLDIRECT in the last 72 hours. Thyroid Function Tests: No results for input(s): TSH, T4TOTAL, FREET4, T3FREE, THYROIDAB in the last 72 hours. Anemia Panel: No results for input(s): VITAMINB12, FOLATE, FERRITIN, TIBC, IRON, RETICCTPCT in the last 72 hours. Urine analysis:    Component Value Date/Time   COLORURINE YELLOW 06/28/2021 1120   APPEARANCEUR CLEAR (A) 06/28/2021 1120   APPEARANCEUR Clear 03/23/2021 1448   LABSPEC 1.020 06/28/2021 1120   LABSPEC 1.005 03/06/2013 0929   PHURINE 5.0 06/28/2021 1120   GLUCOSEU 500 (A) 06/28/2021 1120   GLUCOSEU Negative 03/06/2013 0929   HGBUR NEGATIVE 06/28/2021 1120   BILIRUBINUR NEGATIVE 06/28/2021 1120   BILIRUBINUR Negative 03/23/2021 1448   BILIRUBINUR Negative 03/06/2013 0929   KETONESUR NEGATIVE 06/28/2021 1120   PROTEINUR >300 (A)  06/28/2021 1120   UROBILINOGEN 0.2 02/20/2019 1404   NITRITE NEGATIVE 06/28/2021 1120   LEUKOCYTESUR NEGATIVE 06/28/2021 1120   LEUKOCYTESUR Negative 03/06/2013 0929   Sepsis Labs: _0 (procalcitonin:4,lacticidven:4) ) Recent Results (from the past 240 hour(s))  Resp Panel by RT-PCR (Flu A&B, Covid) Nasopharyngeal Swab     Status: None  Collection Time: 08/05/21  3:59 PM   Specimen: Nasopharyngeal Swab; Nasopharyngeal(NP) swabs in vial transport medium  Result Value Ref Range Status   SARS Coronavirus 2 by RT PCR NEGATIVE NEGATIVE Final    Comment: (NOTE) SARS-CoV-2 target nucleic acids are NOT DETECTED.  The SARS-CoV-2 RNA is generally detectable in upper respiratory specimens during the acute phase of infection. The lowest concentration of SARS-CoV-2 viral copies this assay can detect is 138 copies/mL. A negative result does not preclude SARS-Cov-2 infection and should not be used as the sole basis for treatment or other patient management decisions. A negative result may occur with  improper specimen collection/handling, submission of specimen other than nasopharyngeal swab, presence of viral mutation(s) within the areas targeted by this assay, and inadequate number of viral copies(<138 copies/mL). A negative result must be combined with clinical observations, patient history, and epidemiological information. The expected result is Negative.  Fact Sheet for Patients:  EntrepreneurPulse.com.au  Fact Sheet for Healthcare Providers:  IncredibleEmployment.be  This test is no t yet approved or cleared by the [place] FDA and  has been authorized for detection and/or diagnosis of SARS-CoV-2 by FDA under an Emergency Use Authorization (EUA). This EUA will remain  in effect (meaning this test can be used) for the duration of the COVID-19 declaration under Section 564(b)(1) of the Act, 21 U.S.C.section 360bbb-3(b)(1), unless the  authorization is terminated  or revoked sooner.       Influenza A by PCR NEGATIVE NEGATIVE Final   Influenza B by PCR NEGATIVE NEGATIVE Final    Comment: (NOTE) The Xpert Xpress SARS-CoV-2/FLU/RSV plus assay is intended as an aid in the diagnosis of influenza from Nasopharyngeal swab specimens and should not be used as a sole basis for treatment. Nasal washings and aspirates are unacceptable for Xpert Xpress SARS-CoV-2/FLU/RSV testing.  Fact Sheet for Patients: EntrepreneurPulse.com.au  Fact Sheet for Healthcare Providers: IncredibleEmployment.be  This test is not yet approved or cleared by the [place] FDA and has been authorized for detection and/or diagnosis of SARS-CoV-2 by FDA under an Emergency Use Authorization (EUA). This EUA will remain in effect (meaning this test can be used) for the duration of the COVID-19 declaration under Section 564(b)(1) of the Act, 21 U.S.C. section 360bbb-3(b)(1), unless the authorization is terminated or revoked.  Performed at [facility], [address] [place], [place] [address]      Radiological Exams on Admission: DG Chest 2 View  Result Date: 08/05/2021 CLINICAL DATA:  Chest pain, shortness of breath EXAM: CHEST - 2 VIEW COMPARISON:  Radiograph 06/27/2021 FINDINGS: Unchanged, enlarged cardiac silhouette. Diffuse interstitial opacities. Small bilateral pleural effusions with adjacent basilar opacities, likely atelectasis. No pneumothorax. No acute osseous abnormality. IMPRESSION: Mild pulmonary edema and small bilateral pleural effusions with adjacent basilar atelectasis. Electronically Signed   By: Maurine Simmering M.D.   On: 08/05/2021 16:51    EKG: Independently reviewed.  Sinus tachycardia.  Assessment/Plan Principal Problem:   Acute on chronic systolic CHF (congestive heart failure) (HCC) Active Problems:   Obstructive sleep apnea, adult   Essential hypertension   Type 2 diabetes  mellitus with hyperglycemia (HCC)   CKD (chronic kidney disease), stage IV (HCC)   COPD with acute exacerbation (HCC)   PAD (peripheral artery disease) (HCC)   Acute CHF (congestive heart failure) (HCC)    Shortness of breath likely a combination of acute on chronic systolic heart failure and COPD exacerbation. Acute on chronic systolic CHF last EF measured was in April 2022 which  showed 35 to 40%.  Patient did receive Lasix 60 mg IV in the ER.  We will keep patient on 40 IV every 12 closely follow intake output metabolic panel and daily weights.  Not on ACE inhibitors or ARB due to renal failure.  BNP is pending. COPD exacerbation on nebulizer Pulmicort and steroids. History of paroxysmal atrial fibrillation did have brief runs of A-fib with RVR.  We will continue with beta-blockers and Eliquis. Diabetes mellitus type 2 last hemoglobin A1c in October was 6.8.  On Lantus 10 units in the morning along with sliding scale coverage. Anemia likely from renal disease mild worsening of his anemia.  Follow CBC.  Any further worsening may need further work-up.  Note that patient is on Eliquis. Sleep apnea on CPAP at bedtime. Peripheral artery disease no acute issues at this time. Hypertension uncontrolled likely contributing to patient's symptoms.  Patient takes hydralazine and amlodipine Coreg and hydrochlorothiazide.  Patient is also on Lasix now.  Follow blood pressure trends.  We will keep patient on as needed IV hydralazine.  Since patient has acute CHF and COPD exacerbation will need close monitoring and inpatient status.   DVT prophylaxis: Eliquis. Code Status: Full code. Family Communication: Discussed with patient. Disposition Plan: Home. Consults called: None. Admission status: Inpatient.   Rise Patience MD [facility] Pager [phone].  If 7PM-7AM, please contact night-coverage [email or web address] Password [facility]  08/05/2021, 7:18 PM

## 2021-08-05 NOTE — ED Notes (Signed)
Pt placed on hospital bed and gown at this time.

## 2021-08-05 NOTE — ED Provider Notes (Signed)
[facility] Provider Note    Event Date/Time   First MD Initiated Contact with Patient 08/05/21 1555     (approximate)  History   Chief Complaint: Chest Pain and Shortness of Breath  HPI  Larry Callahan is a 66 y.o. male with a past medical history of CHF, COPD, CKD, diabetes, cardiomyopathy who presents to the emergency department for shortness of breath.  According to the patient he has been experiencing worsening shortness of breath over the past 2 or 3 days.  States 2 weeks ago he was sick with an upper respiratory infection cough and subjective fever but never saw a doctor.  Patient states he has been using his inhalers/breathing treatments at home but is not improving so he came to the emergency department today for evaluation.  Here the patient is tachypneic breathing around 30 breaths/min.  Does have expiratory wheezing in bilateral lung fields.  Denies any known fever, 99.5 in the emergency department.  Physical Exam   Triage Vital Signs: ED Triage Vitals  Enc Vitals Group     BP 08/05/21 1551 (!) 158/88     Pulse Rate 08/05/21 1551 (!) 108     Resp 08/05/21 1551 (!) 26     Temp 08/05/21 1551 99.5 F (37.5 C)     Temp Source 08/05/21 1551 Oral     SpO2 08/05/21 1551 99 %     Weight 08/05/21 1552 264 lb (119.7 kg)     Height 08/05/21 1552 5\' 11"  (1.803 m)     Head Circumference --      Peak Flow --      Pain Score 08/05/21 1552 4     Pain Loc --      Pain Edu? --      Excl. in [place]? --     Most recent vital signs: Vitals:   08/05/21 1551  BP: (!) 158/88  Pulse: (!) 108  Resp: (!) 26  Temp: 99.5 F (37.5 C)  SpO2: 99%    General: Awake, no distress.  CV:  Good peripheral perfusion.  Regular rhythm rate around 110 to 120 bpm. Resp:  Tachypneic speaking in 2-3 word sentences.  Expiratory wheezes in all lung fields. Abd:  No distention.  Soft, nontender.  No rebound or guarding Other:  No significant lower extremity edema   ED  Results / Procedures / Treatments   EKG  EKG viewed and interpreted by myself shows either a sinus tachycardia versus atrial fibrillation at 105 bpm with a narrow QRS, normal axis, normal intervals, nonspecific ST changes but no ST elevation.  RADIOLOGY   I have personally reviewed the chest x-ray images appears to have small pleural effusions bilaterally but no clear opacities. Radiology is read the chest x-ray is mild pulmonary edema small bilateral effusions.   MEDICATIONS ORDERED IN ED: Medications  ipratropium-albuterol (DUONEB) 0.5-2.5 (3) MG/3ML nebulizer solution 3 mL (has no administration in time range)  ipratropium-albuterol (DUONEB) 0.5-2.5 (3) MG/3ML nebulizer solution 3 mL (has no administration in time range)  methylPREDNISolone sodium succinate (SOLU-MEDROL) 125 mg/2 mL injection 125 mg (has no administration in time range)     IMPRESSION / MDM / ASSESSMENT AND PLAN / ED COURSE  I reviewed the triage vital signs and the nursing notes.  Patient presents to the emergency department for worsening shortness of breath.  Patient has been experiencing increasing shortness of breath of the past 3 days or so acutely worse today.  States subjective fever and cough  2 weeks ago but this is largely resolved.  Denies any chest pain.  Patient is tachypneic around 30 breaths/min with expiratory wheeze in bilateral lungs.  We will check labs, COVID/flu, chest x-ray.  We will dose duo nebs, Solu-Medrol and continue to closely monitor.  Patient agreeable to plan of care.  Patient's work-up today shows a chest x-ray consistent with mild pulmonary edema this very likely could be the cause of the patient's shortness of breath.  Patient remains tachypneic even at rest around 30 to 35 breaths/min.  Satting 95% on 3 L with no baseline O2 requirement.  Patient's chemistry is resulted showing creatinine largely at baseline for the patient, CBC shows a normal white blood cell count.  Slightly more  anemic today.  Reassuringly troponin is negative.  Given the patient's increased work of breathing and tachypnea I believe the patient requires admission to the hospital for further work-up and treatment.  We will dose 60 mg of IV Lasix given the chest x-ray findings.  We will also continue with Solu-Medrol and DuoNebs as needed is a highly suspect combination COPD/CHF exacerbation.  Patient agreeable to plan of care.  FINAL CLINICAL IMPRESSION(S) / ED DIAGNOSES   Dyspnea CHF exacerbation COPD exacerbation  Note:  This document was prepared using Dragon voice recognition software and may include unintentional dictation errors.   Harvest Dark, MD 08/05/21 [phone]

## 2021-08-05 NOTE — Progress Notes (Addendum)
Cross Cover Patient admitted with shortness of breath with acute on chronic heart failure concern secondary to chest xray findings with mild pulmonary edema and bilateral pleural effusions, however BNP normal and troponin flat at 14.  He remains tachycardic and hypertensive despite lasix given earlier.  Review of labs shows patient with 2 gm drop in hemoglobin in just over 30 days and presenting symptoms possibly could be symptomatic anemia..  -Hemoccult stools - follow up chest xray to follow up resolution/improvement in pleural effusion -BIPAP as needed for increased work of breathing.  My need referral for OSA reevaluation as patient states he does not think his CPAP is "working right" and may be inadequate support for him now explaining the atelectasis and effusions

## 2021-08-05 NOTE — ED Notes (Signed)
EKG delay due to equipment failure.

## 2021-08-06 ENCOUNTER — Encounter: Payer: Self-pay | Admitting: Nurse Practitioner

## 2021-08-06 DIAGNOSIS — I5023 Acute on chronic systolic (congestive) heart failure: Secondary | ICD-10-CM | POA: Diagnosis not present

## 2021-08-06 LAB — IRON AND TIBC
Iron: 22 ug/dL — ABNORMAL LOW (ref 45–182)
Saturation Ratios: 9 % — ABNORMAL LOW (ref 17.9–39.5)
TIBC: 248 ug/dL — ABNORMAL LOW (ref 250–450)
UIBC: 226 ug/dL

## 2021-08-06 LAB — CBC
HCT: 28.6 % — ABNORMAL LOW (ref 39.0–52.0)
Hemoglobin: 8.8 g/dL — ABNORMAL LOW (ref 13.0–17.0)
MCH: 25.4 pg — ABNORMAL LOW (ref 26.0–34.0)
MCHC: 30.8 g/dL (ref 30.0–36.0)
MCV: 82.7 fL (ref 80.0–100.0)
Platelets: 220 10*3/uL (ref 150–400)
RBC: 3.46 MIL/uL — ABNORMAL LOW (ref 4.22–5.81)
RDW: 15.8 % — ABNORMAL HIGH (ref 11.5–15.5)
WBC: 5.2 10*3/uL (ref 4.0–10.5)
nRBC: 0 % (ref 0.0–0.2)

## 2021-08-06 LAB — GLUCOSE, CAPILLARY: Glucose-Capillary: 307 mg/dL — ABNORMAL HIGH (ref 70–99)

## 2021-08-06 LAB — COMPREHENSIVE METABOLIC PANEL
ALT: 20 U/L (ref 0–44)
AST: 19 U/L (ref 15–41)
Albumin: 2.5 g/dL — ABNORMAL LOW (ref 3.5–5.0)
Alkaline Phosphatase: 61 U/L (ref 38–126)
Anion gap: 10 (ref 5–15)
BUN: 58 mg/dL — ABNORMAL HIGH (ref 8–23)
CO2: 23 mmol/L (ref 22–32)
Calcium: 8.3 mg/dL — ABNORMAL LOW (ref 8.9–10.3)
Chloride: 101 mmol/L (ref 98–111)
Creatinine, Ser: 2.55 mg/dL — ABNORMAL HIGH (ref 0.61–1.24)
GFR, Estimated: 27 mL/min — ABNORMAL LOW (ref >60)
Glucose, Bld: 377 mg/dL — ABNORMAL HIGH (ref 70–99)
Potassium: 4.2 mmol/L (ref 3.5–5.1)
Sodium: 134 mmol/L — ABNORMAL LOW (ref 135–145)
Total Bilirubin: 0.6 mg/dL (ref 0.3–1.2)
Total Protein: 6.7 g/dL (ref 6.5–8.1)

## 2021-08-06 LAB — FERRITIN: Ferritin: 58 ng/mL (ref 24–336)

## 2021-08-06 LAB — RETICULOCYTES
Immature Retic Fract: 17.5 % — ABNORMAL HIGH (ref 2.3–15.9)
RBC.: 3.52 MIL/uL — ABNORMAL LOW (ref 4.22–5.81)
Retic Count, Absolute: 63.7 10*3/uL (ref 19.0–186.0)
Retic Ct Pct: 1.8 % (ref 0.4–3.1)

## 2021-08-06 LAB — MAGNESIUM: Magnesium: 1.9 mg/dL (ref 1.7–2.4)

## 2021-08-06 LAB — FOLATE: Folate: 11.7 ng/mL (ref >5.9)

## 2021-08-06 LAB — CBG MONITORING, ED
Glucose-Capillary: 320 mg/dL — ABNORMAL HIGH (ref 70–99)
Glucose-Capillary: 451 mg/dL — ABNORMAL HIGH (ref 70–99)
Glucose-Capillary: 376 mg/dL — ABNORMAL HIGH (ref 70–99)

## 2021-08-06 MED ORDER — INSULIN ASPART 100 UNIT/ML IJ SOLN
0.0000 [IU] | Freq: Every day | INTRAMUSCULAR | Status: DC
  Administered 2021-08-06: 4 [IU] via SUBCUTANEOUS
  Administered 2021-08-07: 5 [IU] via SUBCUTANEOUS
  Filled 2021-08-06 (×2): qty 1

## 2021-08-06 MED ORDER — IPRATROPIUM BROMIDE 0.02 % IN SOLN
0.5000 mg | Freq: Four times a day (QID) | RESPIRATORY_TRACT | Status: DC
  Administered 2021-08-06 – 2021-08-07 (×4): 0.5 mg via RESPIRATORY_TRACT
  Filled 2021-08-06 (×4): qty 2.5

## 2021-08-06 MED ORDER — INSULIN ASPART 100 UNIT/ML IJ SOLN
0.0000 [IU] | Freq: Three times a day (TID) | INTRAMUSCULAR | Status: DC
  Administered 2021-08-06 – 2021-08-07 (×2): 20 [IU] via SUBCUTANEOUS
  Administered 2021-08-07 – 2021-08-08 (×2): 15 [IU] via SUBCUTANEOUS
  Filled 2021-08-06 (×4): qty 1

## 2021-08-06 MED ORDER — LEVALBUTEROL HCL 0.63 MG/3ML IN NEBU: 0.6300 mg | INHALATION_SOLUTION | Freq: Four times a day (QID) | RESPIRATORY_TRACT | Status: DC | PRN

## 2021-08-06 MED ORDER — INSULIN ASPART 100 UNIT/ML IJ SOLN
15.0000 [IU] | Freq: Once | INTRAMUSCULAR | Status: AC
  Administered 2021-08-06: 15 [IU] via SUBCUTANEOUS
  Filled 2021-08-06: qty 1

## 2021-08-06 NOTE — Progress Notes (Signed)
PROGRESS NOTE    Larry Callahan  JXB:[id number] DOB: 03/30/1956 DOA: 08/05/2021 PCP: [name], NP    Brief Narrative:  Larry Callahan is a 66 year old male with past medical history significant for nonischemic cardiomyopathy (EF 35-40%), COPD, CKD stage IV, chronic anemia, essential hypertension, DM2, OSA who presents to [facility] ED on 2/23 with progressive shortness of breath.  Onset 2 days prior, now with difficulty walking with associated orthopnea.  Denies cough, no fever/chills.  In the ED, temperature 99.5 F, HR 107, RR 26, BP 158/88, SPO2 99% on 3 L nasal cannula.  Sodium 133, potassium 4.3, chloride 101, CO2 22, glucose 166, BUN 49, creatinine 2.50.  WBC 6.9, hemoglobin 9.4, platelets 226.  BNP 72.4, hs-troponin 14>14.  Influenza A/B PCR negative.  COVID-19 PCR negative.  Chest x-ray with mild pulmonary edema, small bilateral pleural effusions with adjacent bibasilar atelectasis.    Assessment & Plan:   Acute respiratory failure with hypoxia, POA Patient presenting with progressive shortness of breath over the last 2 days with decreased exercise tolerance and orthopnea.  BNP was within normal limits, but chest x-ray notable for pulmonary edema and bilateral pleural effusions.  Etiology likely multifactorial with acute on chronic CHF and COPD exacerbation. --Continue supplemental oxygen, maintain SPO2 greater than 88%; currently on 4 L nasal cannula --Incentive spirometry --Continue treatment treatment as below  Acute on chronic combined systolic/diastolic congestive heart failure exacerbation, POA Patient presenting with progressive shortness of breath with known last LVEF 35-40%, LV moderately decreased function, LV dilated with mild MR 09/26/2020.  BMP within normal limits, although chest x-ray notable for pulmonary edema and bilateral pleural effusions. --Repeat TTE --Carvedilol 12.5 mg p.o. twice daily --Furosemide 40 mg IV q12h --Strict I's and O's and  daily weights --BMP daily  Acute COPD exacerbation --Pulmicort neb twice daily --Solu-Medrol 40 mg IV every 12 hours --Xopenex neb every 6 hours as needed shortness of breath/wheezing --Continue supplemental oxygen, maintain SPO2 greater than 88%  Paroxysmal atrial fibrillation Essential hypertension --Carvedilol 12.5 mg p.o. twice daily --Chlorthalidone 25 mg p.o. daily --Eliquis 5 mg p.o. twice daily --Continue monitor on telemetry  Type 2 diabetes mellitus Home regimen includes Toujeo 10 units subcutaneously daily, glimepiride 2 mg p.o. daily --Hemoglobin A1c: Pending --Semglee 10 units  daily --resistant SSI for coverge --CBGs qAC/HS  Anemia likely secondary to chronic renal disease --Hgb 9.4>8.8 --Check anemia panel --Repeat CBC in the a.m.  CKD stage IV Baseline creatinine 2.4-2.7, stable. --Cr 2.50>2.55 --Continue monitor BMP daily in the setting of active IV diuresis as above  Peripheral artery disease --Continue Crestor 10 mg p.o. daily  OSA: Continue nocturnal CPAP    DVT prophylaxis: apixaban (ELIQUIS) tablet 2.5 mg Start: 08/05/21 2200 apixaban (ELIQUIS) tablet 2.5 mg    Code Status: Full Code Family Communication:   Disposition Plan:  Level of care: Telemetry Cardiac Status is: Inpatient Remains inpatient appropriate because: Continues to require supplemental oxygen, not oxygen dependent at baseline, continues IV steroids, IV diuresis, PT/OT evaluation, anticipate discharge home in 2-3 days.     Consultants:  None  Procedures:  TTE: Pending  Antimicrobials:  None   Subjective: Patient seen examined bedside, resting calmly.  Remains in ED holding area.  Reports shortness of breath slightly improved this morning and now off of BiPAP.  Continue to require 4 L nasal cannula, not oxygen dependent at baseline.  Also endorses weakness and fatigue.  No other specific questions or concerns at this time.  Denies headache, no dizziness, no  chest  pain, no palpitations, no abdominal pain, no fever/chills/night sweats, no nausea/vomiting/diarrhea, no paresthesias.  No acute events overnight per nursing staff.  Objective: Vitals:   08/06/21 0900 08/06/21 0915 08/06/21 0930 08/06/21 1000  BP: 126/74  123/80 136/78  Pulse: (!) 106 (!) 107 (!) 107 (!) 110  Resp: (!) 21 (!) 42 (!) 38 (!) 27  Temp:      TempSrc:      SpO2: 92% 96% 96% 94%  Weight:      Height:        Intake/Output Summary (Last 24 hours) at 08/06/2021 1234 Last data filed at 08/06/2021 0304 Gross per 24 hour  Intake --  Output 500 ml  Net -500 ml   Filed Weights   08/05/21 1552  Weight: 119.7 kg    Examination:  Physical Exam: GEN: NAD, alert and oriented x 3, chronically ill appearance, appears older than stated age HEENT: NCAT, PERRL, EOMI, sclera clear, MMM PULM: Decreased breath sounds bilateral bases with crackles/wheezing, normal respiratory effort without accessory muscle use, on 4 L nasal cannula CV: Tachycardic, irregularly irregular rhythm w/o M/G/R GI: abd soft, NTND, NABS, no R/G/M MSK: 1+ pitting edema bilateral lower extremities to mid shin muscle strength globally intact 5/5 bilateral upper/lower extremities NEURO: CN II-XII intact, no focal deficits, sensation to light touch intact PSYCH: normal mood/affect Integumentary: dry/intact, no rashes or wounds    Data Reviewed: I have personally reviewed following labs and imaging studies  CBC: Recent Labs  Lab 08/05/21 1554 08/06/21 0325  WBC 6.9 5.2  HGB 9.4* 8.8*  HCT 30.6* 28.6*  MCV 82.0 82.7  PLT 226 627   Basic Metabolic Panel: Recent Labs  Lab 08/05/21 1554 08/06/21 0325  NA 133* 134*  K 4.3 4.2  CL 101 101  CO2 22 23  GLUCOSE 166* 377*  BUN 49* 58*  CREATININE 2.50* 2.55*  CALCIUM 8.1* 8.3*  MG  --  1.9   GFR: Estimated Creatinine Clearance: 36.5 mL/min (A) (by C-G formula based on SCr of 2.55 mg/dL (H)). Liver Function Tests: Recent Labs  Lab 08/06/21 0325   AST 19  ALT 20  ALKPHOS 61  BILITOT 0.6  PROT 6.7  ALBUMIN 2.5*   No results for input(s): LIPASE, AMYLASE in the last 168 hours. No results for input(s): AMMONIA in the last 168 hours. Coagulation Profile: No results for input(s): INR, PROTIME in the last 168 hours. Cardiac Enzymes: No results for input(s): CKTOTAL, CKMB, CKMBINDEX, TROPONINI in the last 168 hours. BNP (last 3 results) No results for input(s): PROBNP in the last 8760 hours. HbA1C: No results for input(s): HGBA1C in the last 72 hours. CBG: Recent Labs  Lab 08/05/21 2251 08/06/21 0802 08/06/21 1151  GLUCAP 345* 320* 451*   Lipid Profile: No results for input(s): CHOL, HDL, LDLCALC, TRIG, CHOLHDL, LDLDIRECT in the last 72 hours. Thyroid Function Tests: No results for input(s): TSH, T4TOTAL, FREET4, T3FREE, THYROIDAB in the last 72 hours. Anemia Panel: No results for input(s): VITAMINB12, FOLATE, FERRITIN, TIBC, IRON, RETICCTPCT in the last 72 hours. Sepsis Labs: No results for input(s): PROCALCITON, LATICACIDVEN in the last 168 hours.  Recent Results (from the past 240 hour(s))  Resp Panel by RT-PCR (Flu A&B, Covid) Nasopharyngeal Swab     Status: None   Collection Time: 08/05/21  3:59 PM   Specimen: Nasopharyngeal Swab; Nasopharyngeal(NP) swabs in vial transport medium  Result Value Ref Range Status   SARS Coronavirus 2 by RT PCR NEGATIVE NEGATIVE Final  Comment: (NOTE) SARS-CoV-2 target nucleic acids are NOT DETECTED.  The SARS-CoV-2 RNA is generally detectable in upper respiratory specimens during the acute phase of infection. The lowest concentration of SARS-CoV-2 viral copies this assay can detect is 138 copies/mL. A negative result does not preclude SARS-Cov-2 infection and should not be used as the sole basis for treatment or other patient management decisions. A negative result may occur with  improper specimen collection/handling, submission of specimen other than nasopharyngeal swab,  presence of viral mutation(s) within the areas targeted by this assay, and inadequate number of viral copies(<138 copies/mL). A negative result must be combined with clinical observations, patient history, and epidemiological information. The expected result is Negative.  Fact Sheet for Patients:  EntrepreneurPulse.com.au  Fact Sheet for Healthcare Providers:  IncredibleEmployment.be  This test is no t yet approved or cleared by the [place] FDA and  has been authorized for detection and/or diagnosis of SARS-CoV-2 by FDA under an Emergency Use Authorization (EUA). This EUA will remain  in effect (meaning this test can be used) for the duration of the COVID-19 declaration under Section 564(b)(1) of the Act, 21 U.S.C.section 360bbb-3(b)(1), unless the authorization is terminated  or revoked sooner.       Influenza A by PCR NEGATIVE NEGATIVE Final   Influenza B by PCR NEGATIVE NEGATIVE Final    Comment: (NOTE) The Xpert Xpress SARS-CoV-2/FLU/RSV plus assay is intended as an aid in the diagnosis of influenza from Nasopharyngeal swab specimens and should not be used as a sole basis for treatment. Nasal washings and aspirates are unacceptable for Xpert Xpress SARS-CoV-2/FLU/RSV testing.  Fact Sheet for Patients: EntrepreneurPulse.com.au  Fact Sheet for Healthcare Providers: IncredibleEmployment.be  This test is not yet approved or cleared by the [place] FDA and has been authorized for detection and/or diagnosis of SARS-CoV-2 by FDA under an Emergency Use Authorization (EUA). This EUA will remain in effect (meaning this test can be used) for the duration of the COVID-19 declaration under Section 564(b)(1) of the Act, 21 U.S.C. section 360bbb-3(b)(1), unless the authorization is terminated or revoked.  Performed at [facility], [address] [place], [place] [address]           Radiology Studies: DG Chest 2 View  Result Date: 08/05/2021 CLINICAL DATA:  Chest pain, shortness of breath EXAM: CHEST - 2 VIEW COMPARISON:  Radiograph 06/27/2021 FINDINGS: Unchanged, enlarged cardiac silhouette. Diffuse interstitial opacities. Small bilateral pleural effusions with adjacent basilar opacities, likely atelectasis. No pneumothorax. No acute osseous abnormality. IMPRESSION: Mild pulmonary edema and small bilateral pleural effusions with adjacent basilar atelectasis. Electronically Signed   By: Maurine Simmering M.D.   On: 08/05/2021 16:51        Scheduled Meds:  amLODipine  10 mg Oral Daily   apixaban  2.5 mg Oral BID   budesonide (PULMICORT) nebulizer solution  0.25 mg Nebulization BID   carvedilol  12.5 mg Oral BID   chlorthalidone  25 mg Oral Daily   furosemide  40 mg Intravenous Q12H   gabapentin  100 mg Oral TID   hydrALAZINE  100 mg Oral TID   insulin aspart  0-20 Units Subcutaneous TID WC   insulin aspart  0-5 Units Subcutaneous QHS   insulin aspart  15 Units Subcutaneous Once   insulin glargine-yfgn  10 Units Subcutaneous Daily   ipratropium-albuterol  3 mL Nebulization Q4H   methylPREDNISolone (SOLU-MEDROL) injection  40 mg Intravenous Q12H   rosuvastatin  10 mg Oral QHS   Continuous Infusions:  LOS: 1 day    Time spent: 49 minutes spent on chart review, discussion with nursing staff, consultants, updating family and interview/physical exam; more than 50% of that time was spent in counseling and/or coordination of care.    Chakira Jachim J [place] DO [facility] Available via Epic secure chat 7am-7pm After these hours, please refer to coverage provider listed on [email or web address] 08/06/2021, 12:34 PM

## 2021-08-06 NOTE — ED Notes (Signed)
Pt. Ate entire lunch

## 2021-08-06 NOTE — ED Notes (Addendum)
Pt placed on 4L [place] pr request for breakfast. MD made aware

## 2021-08-06 NOTE — Consult Note (Signed)
° °  Heart Failure Nurse Navigator Note  HFrEF 35 to 40% by previous echo.  Echo performed on this admission is pending.  He presented to the emergency room with complaints of worsening shortness of breath over the last few days, fatigue and orthopnea.  Chest x-ray revealed pulmonary edema and bilateral pleural effusions.  Comorbidities:  COPD Sleep apnea Chronic kidney disease stage IV Chronic anemia Hypertension Diabetes Paroxysmal atrial fibrillation  Medications:  Furosemide 40 mg IV every 12 Amlodipine 10 mg daily Carvedilol 12 and half milligrams twice daily Hydralazine 100 mg 3 times daily Crestor 10 mg at at bedtime Apixaban 2.5 mg twice a day  Labs:  Sodium 134, potassium 4.2, chloride 101, CO2 23, BUN 58, creatinine 2.55, GFR 27 Weight is 119.7 kg Blood pressure 138/80   Initial meeting with patient in the emergency room.  He states that he lives by himself and does his own cooking, does not use canned vegetables or use salt at the table.  He states that he rarely will eat a hotdog or anything that is of processed meat.  He has a scale but the battery was not working so he has not been weighing himself on a daily basis.  Discussed the importance of daily weights an and reporting weight increases to his physician.  He states that he did not finish high school and is not really sure how far he went in school before he dropped out but states that he can read some things but does not read well.  Also discussed fluid restriction.  Stressed the importance of follow-up in the outpatient heart failure clinic he was given an appointment for March ninth at 1:30 PM.  He has a no-show ratio of 8% which is 11 out of 135 appointments.  He states that usually transportation is a problem but does use [employer].  Explained that he could use [employer] to bring him to his heart failure clinic appointment.  Pricilla Riffle RN CHFN

## 2021-08-06 NOTE — ED Notes (Signed)
This RN notified Dr. [place] of pt's noon BGL of 451, awaiting orders.

## 2021-08-06 NOTE — ED Notes (Signed)
Pt. Provided diet coke and ice.

## 2021-08-07 ENCOUNTER — Observation Stay: Payer: Medicare PPO

## 2021-08-07 DIAGNOSIS — R0602 Shortness of breath: Secondary | ICD-10-CM | POA: Diagnosis not present

## 2021-08-07 DIAGNOSIS — J90 Pleural effusion, not elsewhere classified: Secondary | ICD-10-CM | POA: Diagnosis not present

## 2021-08-07 DIAGNOSIS — N189 Chronic kidney disease, unspecified: Secondary | ICD-10-CM | POA: Diagnosis not present

## 2021-08-07 DIAGNOSIS — J449 Chronic obstructive pulmonary disease, unspecified: Secondary | ICD-10-CM | POA: Diagnosis not present

## 2021-08-07 LAB — BASIC METABOLIC PANEL
Anion gap: 12 (ref 5–15)
BUN: 71 mg/dL — ABNORMAL HIGH (ref 8–23)
CO2: 24 mmol/L (ref 22–32)
Calcium: 8.2 mg/dL — ABNORMAL LOW (ref 8.9–10.3)
Chloride: 97 mmol/L — ABNORMAL LOW (ref 98–111)
Creatinine, Ser: 2.55 mg/dL — ABNORMAL HIGH (ref 0.61–1.24)
GFR, Estimated: 27 mL/min — ABNORMAL LOW (ref >60)
Glucose, Bld: 386 mg/dL — ABNORMAL HIGH (ref 70–99)
Potassium: 3.8 mmol/L (ref 3.5–5.1)
Sodium: 133 mmol/L — ABNORMAL LOW (ref 135–145)

## 2021-08-07 LAB — GLUCOSE, CAPILLARY
Glucose-Capillary: 376 mg/dL — ABNORMAL HIGH (ref 70–99)
Glucose-Capillary: 427 mg/dL — ABNORMAL HIGH (ref 70–99)
Glucose-Capillary: 381 mg/dL — ABNORMAL HIGH (ref 70–99)
Glucose-Capillary: 397 mg/dL — ABNORMAL HIGH (ref 70–99)

## 2021-08-07 LAB — CBC
HCT: 28.9 % — ABNORMAL LOW (ref 39.0–52.0)
Hemoglobin: 9.1 g/dL — ABNORMAL LOW (ref 13.0–17.0)
MCH: 25.2 pg — ABNORMAL LOW (ref 26.0–34.0)
MCHC: 31.5 g/dL (ref 30.0–36.0)
MCV: 80.1 fL (ref 80.0–100.0)
Platelets: 252 10*3/uL (ref 150–400)
RBC: 3.61 MIL/uL — ABNORMAL LOW (ref 4.22–5.81)
RDW: 15.3 % (ref 11.5–15.5)
WBC: 10.3 10*3/uL (ref 4.0–10.5)
nRBC: 0 % (ref 0.0–0.2)

## 2021-08-07 LAB — HEMOGLOBIN A1C
Hgb A1c MFr Bld: 7 % — ABNORMAL HIGH (ref 4.8–5.6)
Mean Plasma Glucose: 154 mg/dL

## 2021-08-07 LAB — VITAMIN B12: Vitamin B-12: 893 pg/mL (ref 180–914)

## 2021-08-07 LAB — MAGNESIUM: Magnesium: 1.8 mg/dL (ref 1.7–2.4)

## 2021-08-07 MED ORDER — IPRATROPIUM BROMIDE 0.02 % IN SOLN
0.5000 mg | Freq: Three times a day (TID) | RESPIRATORY_TRACT | Status: DC
  Administered 2021-08-07 – 2021-08-08 (×3): 0.5 mg via RESPIRATORY_TRACT
  Filled 2021-08-07 (×4): qty 2.5

## 2021-08-07 MED ORDER — INSULIN ASPART 100 UNIT/ML IJ SOLN
24.0000 [IU] | Freq: Once | INTRAMUSCULAR | Status: AC
  Administered 2021-08-07: 24 [IU] via SUBCUTANEOUS
  Filled 2021-08-07: qty 1

## 2021-08-07 NOTE — TOC Initial Note (Signed)
Transition of Care [facility]) - Initial/Assessment Note    Patient Details  Name: Larry Callahan MRN: [id number] Date of Birth: [birth year]  Transition of Care [facility]) CM/SW Contact:    Magnus Ivan, LCSW Phone Number: 08/07/2021, 2:37 PM  Clinical Narrative:            Completed high risk assessment with patient. Patient lives alone. Uses [employer] transportation. PCP Dr Humphrey Rolls. Pharmacy [facility]. Patient has a cane and cpap.  Patient says his sister lives 3 houses down from him and is supportive. Discussed PT recs. Patient confirmed he is refusing [place] and 3 in 1. Patient does want to continue with HHPT, thinks it is through [facility]. Secure email sent to [facility] Representative Merleen Nicely.       Expected Discharge Plan: [place] Barriers to Discharge: Continued Medical Work up   Patient Goals and CMS Choice Patient states their goals for this hospitalization and ongoing recovery are:: home with home health CMS Medicare.gov Compare Post Acute Care list provided to:: Patient Choice offered to / list presented to : Patient  Expected Discharge Plan and Services Expected Discharge Plan: [place]       Living arrangements for the past 2 months: Single Family Home                           HH Arranged: PT [facility] Agency: [facility] Date [facility] Agency Contacted: 08/07/21   Representative spoke with at [place]: Merleen Nicely  Prior Living Arrangements/Services Living arrangements for the past 2 months: [place] Lives with:: Self Patient language and need for interpreter reviewed:: Yes Do you feel safe going back to the place where you live?: Yes      Need for Family Participation in Patient Care: Yes (Comment) Care giver support system in place?: Yes (comment) Current home services: DME Criminal Activity/Legal Involvement Pertinent to Current Situation/Hospitalization: No - Comment as needed  Activities of Daily Living Home  Assistive Devices/Equipment: CPAP, Walker (specify type) ADL Screening (condition at time of admission) Patient's cognitive ability adequate to safely complete daily activities?: Yes Is the patient deaf or have difficulty hearing?: No Does the patient have difficulty seeing, even when wearing glasses/contacts?: No Does the patient have difficulty concentrating, remembering, or making decisions?: Yes Patient able to express need for assistance with ADLs?: Yes Does the patient have difficulty dressing or bathing?: Yes Independently performs ADLs?: Yes (appropriate for developmental age) Does the patient have difficulty walking or climbing stairs?: Yes Weakness of Legs: None Weakness of Arms/Hands: None  Permission Sought/Granted Permission sought to share information with : Chartered certified accountant granted to share information with : Yes, Verbal Permission Granted     Permission granted to share info w AGENCY: [facility]        Emotional Assessment       Orientation: : Oriented to Self, Oriented to Place, Oriented to  Time, Oriented to Situation Alcohol / Substance Use: Not Applicable Psych Involvement: No (comment)  Admission diagnosis:  COPD (chronic obstructive pulmonary disease) (HCC) [J44.9] Pleural effusion [J90] COPD exacerbation (HCC) [J44.1] Acute CHF (congestive heart failure) (HCC) [I50.9] Acute on chronic congestive heart failure, unspecified heart failure type [facility]) [I50.9] Patient Active Problem List   Diagnosis Date Noted   Acute on chronic systolic CHF (congestive heart failure) ([place]) 08/05/2021   Acute CHF (congestive heart failure) ([place]) 08/05/2021   COPD  exacerbation ([place]) 06/28/2021   Acquired thrombophilia ([place]) 06/28/2021   Anemia in chronic kidney disease (CKD) 06/28/2021   Chest pain 06/28/2021   PAD (peripheral artery disease) (HCC)    Cellulitis of right arm 03/02/2021   Chronic anticoagulation 03/02/2021   COPD with acute  exacerbation ([place]) 01/29/2021   Atrial flutter ([place]) 01/07/2021   Syncope and collapse 11/16/2020   Demand ischemia ([place])    Influenza A 10/30/2020   HLD (hyperlipidemia) 10/06/2020   History of CVA (cerebrovascular accident) 09/25/2020   Gastroesophageal reflux disease without esophagitis 04/19/2020   Pneumonia due to COVID-19 virus 01/30/2020   Elevated troponin I level 01/29/2020   Suspected COVID-19 virus infection 01/29/2020   Lightheadedness 01/29/2020   Hematemesis 01/09/2020   Hospital discharge follow-up 11/26/2019   Generalized weakness 08/08/2019   Obesity (BMI 30-39.9) 08/08/2019   Acute right hip pain 06/17/2019   Inability to ambulate due to hip 06/17/2019   CKD (chronic kidney disease), stage IV (HCC)    Hypervolemia    Type 2 diabetes mellitus with stage 3b chronic kidney disease, with long-term current use of insulin ([place])    Hypomagnesemia    Full code status 05/20/2019   Pleuritic chest pain    Multifocal pneumonia 05/19/2019   Acute kidney injury superimposed on CKD 3b ([place]) 05/09/2019   Hemoptysis 04/09/2019   Shortness of breath 03/26/2019   Uncontrolled type 2 diabetes mellitus with insulin therapy 03/12/2019   Acute on chronic heart failure with preserved ejection fraction (HFpEF) ([place])    Stage 3b chronic kidney disease ([place])    Chronic systolic CHF (congestive heart failure) ([place]) 75/03/2584   Diastolic dysfunction 27/78/2423   Iron deficiency anemia 12/31/2018   Atopic dermatitis 11/24/2018   Screening for colon cancer 11/06/2017   Uncontrolled type 2 diabetes mellitus with hyperglycemia ([place]) 11/06/2017   Hidradenitis 06/09/2017   Atherosclerotic heart disease of native coronary artery without angina pectoris 06/09/2017   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   Obstructive sleep apnea, adult 06/09/2017   Morbid (severe) obesity due to excess calories ([place]) 06/09/2017   Cigarette nicotine dependence with  nicotine-induced disorder 06/09/2017   Diabetic polyneuropathy associated with type 2 diabetes mellitus ([place]) 06/09/2017   Allergic rhinitis due to pollen 06/09/2017   Mixed hyperlipidemia 06/09/2017   COPD (chronic obstructive pulmonary disease) ([place]) 06/09/2017   Dyspnea on exertion 06/09/2017   Wheezing 06/09/2017   Dysuria 06/09/2017   Snoring 06/09/2017   Essential hypertension 06/09/2017   Personal history of other malignant neoplasm of kidney 06/09/2017   Pain in right hip 06/09/2017   Impacted cerumen, bilateral 06/09/2017   Secondary hyperparathyroidism, not elsewhere classified ([place]) 06/09/2017   Type 2 diabetes mellitus with hyperglycemia ([place]) 06/09/2017   Tinea corporis 06/09/2017   PCP:  [name], NP Pharmacy:   [place] [place] [facility] [address] [name] [address] Phone: [phone] Fax: [phone]     Social Determinants of Health (SDOH) Interventions    Readmission Risk Interventions Readmission Risk Prevention Plan 08/07/2021 06/28/2021 03/05/2021  Transportation Screening Complete Complete Complete  Medication Review Press photographer) Complete Complete Complete  PCP or Specialist appointment within 3-5 days of discharge Complete Complete Complete  HRI or Home Care Consult Complete Complete Complete  SW Recovery Care/Counseling Consult Complete Complete -  SW Consult Not Complete Comments - - -  Palliative Care Screening Complete Not Applicable Not Applicable  Skilled Nursing Facility Complete  Not Applicable Not Applicable  Some recent data might be hidden

## 2021-08-07 NOTE — Evaluation (Addendum)
Physical Therapy Evaluation Patient Details Name: Larry Callahan MRN: [id number] DOB: 10-18-1955 Today's Date: 08/07/2021  History of Present Illness  Pt admitted to [facility] on 08/05/21 under observation for c/o worsening SOB and chest pain. Pt reports URI 2 weeks ago; did not seek medical attention but used inhalers at home. Significant PMH includes: nonischemic cardiomyopathy last EF measured in April 2022 was 39 to 40%, COPD, sleep apnea, chronic kidney disease stage IV, chronic anemia, hypertension, and diabetes mellitus. Imaging significant for small pleural effusions bil and mild pulmonary edema. Dx with acute on chronic systolic CHF.   Clinical Impression  Pt is a 66 year old M admitted to hospital on 08/05/21 for acute on chronic systolic CHF. At baseline, pt was mod I for ADL's, IADL's, and limited ambulation without AD. Pt presents with decreased activity tolerance, decreased standing balance, increased O2 dependence from baseline, poor safety awareness, and impaired cardiopulmonary tolerance to activity, resulting in impaired functional mobility from baseline. Due to deficits, pt required mod I for bed mobility, CGA-supervision for transfers, and CGA-supervision for gait in RW. Increased work of breathing noted during gait with SpO2 >90% and pt RPE of 2-3/10 indicating "light activity." Deficits limit the pt's ability to safely and independently perform ADL's, transfer, and ambulate. Pt will benefit from acute skilled PT services to address deficits for return to baseline function. Pt appropriate to ambulate with nursing and mobility specialist while hospitalized for further progress towards goals. At this time, PT recommends HHPT at DC to address deficits and improve overall safety with functional mobility. Also recommending [place] aide to assist with IADL's and 3in1 for safety and energy conservation with self care ADL's; pt declining.        Recommendations for follow up therapy are one  component of a multi-disciplinary discharge planning process, led by the attending physician.  Recommendations may be updated based on patient status, additional functional criteria and insurance authorization.  Follow Up Recommendations Home health PT    Assistance Recommended at Discharge PRN     Equipment Recommendations BSC/3in1     Functional Status Assessment Patient has had a recent decline in their functional status and demonstrates the ability to make significant improvements in function in a reasonable and predictable amount of time.     Precautions / Restrictions Precautions Precautions: Fall Restrictions Weight Bearing Restrictions: No      Mobility  Bed Mobility Overal bed mobility: Modified Independent             General bed mobility comments: HOB elevated, use of BUE for support    Transfers Overall transfer level: Needs assistance Equipment used: Rolling walker (2 wheels) Transfers: Sit to/from Stand Sit to Stand: Min guard, Supervision           General transfer comment: Initially CGA, progressing to supervision for safety. no AD    Ambulation/Gait Ambulation/Gait assistance: Supervision, Min guard Gait Distance (Feet): 240 Feet (175ft x1, 17ft x2) Assistive device: None         General Gait Details: Initially CGA for safety, progressing to supervision. On 2L with SpO2 >90%. Demonstrates reciprocal gait, decreased foot clearance bil, and increased M/L sway with weight shift. Labored breathing noted despite pt reports of low work of breathing/intensity.     Balance Overall balance assessment: Needs assistance Sitting-balance support: No upper extremity supported, Feet supported Sitting balance-Leahy Scale: Good     Standing balance support: No upper extremity supported, During functional activity Standing balance-Leahy Scale: Fair  Pertinent Vitals/Pain Pain Assessment Pain Assessment:  No/denies pain    Home Living Family/patient expects to be discharged to:: Private residence Living Arrangements: Alone Available Help at Discharge: Family;Available PRN/intermittently Type of Home: Mobile home Home Access: Stairs to enter Entrance Stairs-Rails: Left Entrance Stairs-Number of Steps: 5   Home Layout: One level Home Equipment: Cane - single point Additional Comments: CPAP    Prior Function Prior Level of Function : Independent/Modified Independent;History of Falls (last six months)             Mobility Comments: Mod I with limited ambulation without AD secondary to SOB. States he holds onto Chief Financial Officer for tub transfers. ADLs Comments: Mod I for ADL's and IADL's; uses public transportation. States he was active with HHPT and [place]. Per chart review, HHRN comes monthly for blood sugar management.     Hand Dominance   Dominant Hand: Right    Extremity/Trunk Assessment   Upper Extremity Assessment Upper Extremity Assessment: Overall WFL for tasks assessed    Lower Extremity Assessment Lower Extremity Assessment: Overall WFL for tasks assessed       Communication   Communication: No difficulties (requires frequent redirection/attention to task)  Cognition Arousal/Alertness: Awake/alert Behavior During Therapy: WFL for tasks assessed/performed Overall Cognitive Status: Within Functional Limits for tasks assessed                                 General Comments: A&O X4; able to follow 100% of simple 2-step commands. Increased time for processing of task and required frequent cues for attention to/redirection of task        General Comments General comments (skin integrity, edema, etc.): 2L O2 via [place], SpO2 >90% throughout session. HR elevated to 137 bpm during gait    Exercises Other Exercises Other Exercises: Bed mobility, transfers, and gait without AD. Increased time during session for redirection/attention to task. Educated re: PT  role/POC, DC recommendations, safety with mobility.   Assessment/Plan    PT Assessment Patient needs continued PT services  PT Problem List Decreased activity tolerance;Decreased balance;Decreased safety awareness;Cardiopulmonary status limiting activity       PT Treatment Interventions Gait training;Stair training;Functional mobility training;Therapeutic activities;Therapeutic exercise;Balance training;Neuromuscular re-education    PT Goals (Current goals can be found in the Care Plan section)  Acute Rehab PT Goals Patient Stated Goal: "go home" PT Goal Formulation: With patient Time For Goal Achievement: 08/21/21 Potential to Achieve Goals: Good    Frequency Min 2X/week        AM-PAC PT "6 Clicks" Mobility  Outcome Measure Help needed turning from your back to your side while in a flat bed without using bedrails?: None Help needed moving from lying on your back to sitting on the side of a flat bed without using bedrails?: None Help needed moving to and from a bed to a chair (including a wheelchair)?: A Little Help needed standing up from a chair using your arms (e.g., wheelchair or bedside chair)?: A Little Help needed to walk in hospital room?: A Little Help needed climbing 3-5 steps with a railing? : A Little 6 Click Score: 20    End of Session Equipment Utilized During Treatment: Gait belt;Oxygen (2L) Activity Tolerance: Patient tolerated treatment well Patient left: in chair;with call bell/phone within reach;with chair alarm set Nurse Communication: Mobility status (vitals) PT Visit Diagnosis: Unsteadiness on feet (R26.81);Difficulty in walking, not elsewhere classified (R26.2)    Time: 9163-8466 PT Time  Calculation (min) (ACUTE ONLY): 36 min   Charges:   PT Evaluation $PT Eval Low Complexity: 1 Low PT Treatments $Therapeutic Exercise: 8-22 mins       Herminio Commons, PT, DPT 2:34 PM,08/07/21

## 2021-08-07 NOTE — Plan of Care (Signed)
?  Problem: Education: ?Goal: Ability to verbalize understanding of medication therapies will improve ?Outcome: Progressing ?  ?Problem: Activity: ?Goal: Capacity to carry out activities will improve ?Outcome: Progressing ?  ?Problem: Cardiac: ?Goal: Ability to achieve and maintain adequate cardiopulmonary perfusion will improve ?Outcome: Progressing ?  ?

## 2021-08-07 NOTE — Plan of Care (Signed)
°  Problem: Education: °Goal: Ability to demonstrate management of disease process will improve °Outcome: Progressing °Goal: Ability to verbalize understanding of medication therapies will improve °Outcome: Progressing °Goal: Individualized Educational Video(s) °Outcome: Progressing °  °

## 2021-08-08 ENCOUNTER — Inpatient Hospital Stay (HOSPITAL_COMMUNITY)
Admit: 2021-08-08 | Discharge: 2021-08-08 | Disposition: A | Discharge status: Home / Self Care | Payer: Medicare PPO | Attending: Internal Medicine | Admitting: Internal Medicine

## 2021-08-08 DIAGNOSIS — R0609 Other forms of dyspnea: Secondary | ICD-10-CM | POA: Diagnosis not present

## 2021-08-08 DIAGNOSIS — I5023 Acute on chronic systolic (congestive) heart failure: Secondary | ICD-10-CM | POA: Diagnosis not present

## 2021-08-08 LAB — BASIC METABOLIC PANEL
Anion gap: 16 — ABNORMAL HIGH (ref 5–15)
BUN: 89 mg/dL — ABNORMAL HIGH (ref 8–23)
CO2: 24 mmol/L (ref 22–32)
Calcium: 8.4 mg/dL — ABNORMAL LOW (ref 8.9–10.3)
Chloride: 94 mmol/L — ABNORMAL LOW (ref 98–111)
Creatinine, Ser: 2.68 mg/dL — ABNORMAL HIGH (ref 0.61–1.24)
GFR, Estimated: 25 mL/min — ABNORMAL LOW (ref >60)
Glucose, Bld: 458 mg/dL — ABNORMAL HIGH (ref 70–99)
Potassium: 3.3 mmol/L — ABNORMAL LOW (ref 3.5–5.1)
Sodium: 134 mmol/L — ABNORMAL LOW (ref 135–145)

## 2021-08-08 LAB — ECHOCARDIOGRAM COMPLETE
AR max vel: 2.59 cm2
AV Peak grad: 5.3 mmHg
Ao pk vel: 1.15 m/s
Area-P 1/2: 4.15 cm2
Height: 71 in
S' Lateral: 3.75 cm
Single Plane A4C EF: 64.6 %
Weight: 4116.43 oz

## 2021-08-08 LAB — GLUCOSE, CAPILLARY
Glucose-Capillary: 308 mg/dL — ABNORMAL HIGH (ref 70–99)
Glucose-Capillary: 310 mg/dL — ABNORMAL HIGH (ref 70–99)
Glucose-Capillary: 435 mg/dL — ABNORMAL HIGH (ref 70–99)

## 2021-08-08 MED ORDER — PERFLUTREN LIPID MICROSPHERE
1.0000 mL | INTRAVENOUS | Status: AC | PRN
  Administered 2021-08-08: 3 mL via INTRAVENOUS
  Filled 2021-08-08: qty 10

## 2021-08-08 MED ORDER — FUROSEMIDE 40 MG PO TABS
40.0000 mg | ORAL_TABLET | Freq: Two times a day (BID) | ORAL | Status: DC
Start: 2021-08-08 — End: 2021-08-08

## 2021-08-08 MED ORDER — INSULIN ASPART 100 UNIT/ML IJ SOLN
25.0000 [IU] | Freq: Once | INTRAMUSCULAR | Status: AC
  Administered 2021-08-08: 25 [IU] via SUBCUTANEOUS
  Filled 2021-08-08: qty 1

## 2021-08-08 MED ORDER — INSULIN GLARGINE (1 UNIT DIAL) 300 UNIT/ML ~~LOC~~ SOPN: 20.0000 [IU] | PEN_INJECTOR | Freq: Every day | SUBCUTANEOUS | 3 refills | Status: DC

## 2021-08-08 MED ORDER — BUDESONIDE 0.25 MG/2ML IN SUSP: 0.2500 mg | Freq: Every day | RESPIRATORY_TRACT | 0 refills | Status: DC

## 2021-08-08 MED ORDER — FUROSEMIDE 40 MG PO TABS: 40.0000 mg | ORAL_TABLET | Freq: Two times a day (BID) | ORAL | 0 refills | Status: DC

## 2021-08-08 MED ORDER — PREDNISONE 20 MG PO TABS: ORAL_TABLET | ORAL | 0 refills | Status: AC

## 2021-08-08 NOTE — Plan of Care (Signed)
°  Problem: Education: °Goal: Ability to demonstrate management of disease process will improve °Outcome: Progressing °Goal: Ability to verbalize understanding of medication therapies will improve °Outcome: Progressing °Goal: Individualized Educational Video(s) °Outcome: Progressing °  °

## 2021-08-08 NOTE — Assessment & Plan Note (Addendum)
The patient is receiving IV steroids and nebulizer treatments.

## 2021-08-08 NOTE — TOC Transition Note (Signed)
Transition of Care [facility]) - CM/SW Discharge Note   Patient Details  Name: Larry Callahan MRN: [id number] Date of Birth: 1955-10-20  Transition of Care [facility]) CM/SW Contact:  Magnus Ivan, LCSW Phone Number: 08/08/2021, 1:37 PM   Clinical Narrative:   CSW notified Merleen Nicely with [facility] that patient has orders to DC home today.     Final next level of care: [place] Barriers to Discharge: Barriers Resolved   Patient Goals and CMS Choice Patient states their goals for this hospitalization and ongoing recovery are:: home with home health CMS Medicare.gov Compare Post Acute Care list provided to:: Patient Choice offered to / list presented to : Patient  Discharge Placement                       Discharge Plan and Services                          HH Arranged: PT [facility] Agency: [facility] Date [facility] Agency Contacted: 08/08/21   Representative spoke with at [place]: [place] Interventions     Readmission Risk Interventions Readmission Risk Prevention Plan 08/07/2021 06/28/2021 03/05/2021  Transportation Screening Complete Complete Complete  Medication Review Press photographer) Complete Complete Complete  PCP or Specialist appointment within 3-5 days of discharge Complete Complete Complete  HRI or Home Care Consult Complete Complete Complete  SW Recovery Care/Counseling Consult Complete Complete -  SW Consult Not Complete Comments - - -  Palliative Care Screening Complete Not Applicable Not Applicable  Skilled Nursing Facility Complete Not Applicable Not Applicable  Some recent data might be hidden

## 2021-08-08 NOTE — Assessment & Plan Note (Signed)
Noted  

## 2021-08-08 NOTE — Progress Notes (Signed)
Cpap refused 

## 2021-08-08 NOTE — Progress Notes (Signed)
SATURATION QUALIFICATIONS: (This note is used to comply with regulatory documentation for home oxygen)  Patient Saturations on Room Air at Rest = 96%  Patient Saturations on Room Air while Ambulating = 98%    Please briefly explain why patient needs home oxygen: no need   Tarris Delbene, Tivis Ringer, RN

## 2021-08-08 NOTE — Assessment & Plan Note (Addendum)
Patient presenting with progressive shortness of breath with known last LVEF 35-40%, LV moderately decreased function, LV dilated with mild MR 09/26/2020.  BMP within normal limits, although chest x-ray notable for pulmonary edema and bilateral pleural effusions. --Repeat TTE --Carvedilol 12.5 mg p.o. twice daily --Furosemide 40 mg IV q12h --Strict I's and O's and daily weights --BMP daily

## 2021-08-08 NOTE — Progress Notes (Signed)
Mobility Specialist - Progress Note    08/08/21 1100  Mobility  Activity Ambulated independently in hallway;Stood at bedside;Dangled on edge of bed  Range of Motion/Exercises Right leg;Left leg  Level of Assistance Independent  Assistive Device None  Distance Ambulated (ft) 200 ft  Activity Response Tolerated well  $Mobility charge 1 Mobility    Pre-mobility: 66 HR, 91% SpO2 During mobility: 90-103 HR,  93% SpO2  Pt sitting in chair utilizing RA upon arrival. Completed 5 STS and BLE [place] Independently with no complaints. Ambulated 200 ft Indep. ---- no AD or LOB noted. Pt returned to chair with alarm set and needs in reach.  Merrily Brittle Mobility Specialist 08/08/21, 11:19 AM

## 2021-08-08 NOTE — Assessment & Plan Note (Addendum)
The patient has been having high glucoses likely due to steroids. This has been covered by SSI.

## 2021-08-08 NOTE — Progress Notes (Signed)
*  PRELIMINARY RESULTS* Echocardiogram 2D Echocardiogram has been performed. Definity IV Contrast used on this study.  Larry Callahan 08/08/2021, 10:58 AM

## 2021-08-08 NOTE — Plan of Care (Signed)
°  Problem: Education: Goal: Ability to demonstrate management of disease process will improve 08/08/2021 1418 by Emmaline Life, RN Outcome: Adequate for Discharge 08/08/2021 0802 by Emmaline Life, RN Outcome: Progressing Goal: Ability to verbalize understanding of medication therapies will improve 08/08/2021 1418 by Emmaline Life, RN Outcome: Adequate for Discharge 08/08/2021 0802 by Emmaline Life, RN Outcome: Progressing Goal: Individualized Educational Video(s) 08/08/2021 1418 by Emmaline Life, RN Outcome: Adequate for Discharge 08/08/2021 0802 by Emmaline Life, RN Outcome: Progressing   Problem: Activity: Goal: Capacity to carry out activities will improve Outcome: Adequate for Discharge   Problem: Cardiac: Goal: Ability to achieve and maintain adequate cardiopulmonary perfusion will improve Outcome: Adequate for Discharge   Problem: Acute Rehab PT Goals(only PT should resolve) Goal: Pt Will Go Supine/Side To Sit Outcome: Adequate for Discharge Goal: Patient Will Perform Sitting Balance Outcome: Adequate for Discharge Goal: Patient Will Transfer Sit To/From Stand Outcome: Adequate for Discharge Goal: Pt Will Perform Standing Balance Or Pre-Gait Outcome: Adequate for Discharge Goal: Pt Will Ambulate Outcome: Adequate for Discharge Goal: Pt Will Go Up/Down Stairs Outcome: Adequate for Discharge

## 2021-08-08 NOTE — Progress Notes (Signed)
Discharge instructions, Rx's and follow up appts explained and provided to patient verbalized understanding. Patient left floor via wheelchair accompanied by staff no c/o pain or shortness of breath at d/c.  Jaeline Whobrey Lynn, RN  

## 2021-08-08 NOTE — Assessment & Plan Note (Signed)
Blood pressures are normotensive. Continue to monitor.

## 2021-08-08 NOTE — Discharge Summary (Signed)
Physician Discharge Summary   Patient: Larry Callahan MRN: [id number] DOB: 31-Aug-1955  Admit date:     08/05/2021  Discharge date: 08/08/21  Discharge Physician: Karie Kirks   PCP: [name], NP   Recommendations at discharge:    Discharge to home. Follow up with PCP in 7-10 days. Have chemistry drawn on that visit. It should be reported to PCP. Follow up with Dr. Rockey Situ in 2-4 weeks. Call for appointment. Keep appointment with heart failure clinic.  Discharge Diagnoses: Principal Problem:   Acute on chronic systolic CHF (congestive heart failure) (HCC) Active Problems:   Obstructive sleep apnea, adult   Essential hypertension   Type 2 diabetes mellitus with hyperglycemia (HCC)   CKD (chronic kidney disease), stage IV (HCC)   COPD with acute exacerbation (HCC)   PAD (peripheral artery disease) (HCC)   Acute CHF (congestive heart failure) ([place])  Resolved Problems:   * No resolved hospital problems. [facility] Course: The patient was admitted to a telemetry bed. He required 3 L O2 to maintain saturations in the 90's on admission. He has been on room air for two days after diuresis and treatment for his COPD exacerbation. He was given IV lasix, steroids, and nebulizer treatments. He was diuresed. His fluid balance is negative 4.9 L.   Echocardiogram was obtained. It demonstrated LVEF 40-45%. There is mild to moderately decreased function of the left ventricle. The diastolic parameters are indeterminate. The RV systolic function is low normal.   The patient will be discharged to home today in fair condition. He will follow up with Dr. Rockey Situ and keep his appointment with the heart failure clinic. He is not discharged on lisinopril due to his chronic kidney disease.  Assessment and Plan: Assessment and Plan: * Acute on chronic systolic CHF (congestive heart failure) ([place])- (present on admission) The patient was admitted and started on lasix 40  Mg bid. He has  diuresed well and is negative 4.9 L at discharge.   Echocardiogram was obtained. It demonstrated LVEF 40-45%. There is mild to moderately decreased function of the left ventricle. The diastolic parameters are indeterminate. The RV systolic function is low normal.   PAD (peripheral artery disease) ([place])- (present on admission) Noted.   COPD with acute exacerbation ([place])- (present on admission) The patient has received IV steroids and nebulizer treatments. The steroids have been converted to oral prednisone and will be continued as a taper at discharge.  CKD (chronic kidney disease), stage IV ([place])- (present on admission) Creatinine is at baseline at 2.68. The patient is not a candidate for ACE I or ARB due to CKD.  Type 2 diabetes mellitus with hyperglycemia ([place])- (present on admission) The patient has been continued on lantus at discharge.   Essential hypertension- (present on admission) Blood pressures are normotensive. Continue to monitor.    Consultants: None Procedures performed: None  Disposition: Home Diet recommendation:  Discharge Diet Orders (From admission, onward)     Start     Ordered   08/08/21 0000  Diet - low sodium heart healthy        08/08/21 1330           Cardiac and Carb modified diet  DISCHARGE MEDICATION: Allergies as of 08/08/2021       Reactions   Penicillins Rash        Medication List     STOP taking these medications    clindamycin 1 % gel Commonly known as: Clindagel   meclizine 25 MG  tablet Commonly known as: ANTIVERT       TAKE these medications    amLODipine 10 MG tablet Commonly known as: NORVASC Take 1 tablet (10 mg total) by mouth daily.   apixaban 2.5 MG Tabs tablet Commonly known as: ELIQUIS Take 1 tablet (2.5 mg total) by mouth 2 (two) times daily.   Breztri Aerosphere 160-9-4.8 MCG/ACT Aero Generic drug: Budeson-Glycopyrrol-Formoterol Inhale 2 puffs into the lungs in the morning and at bedtime.    budesonide 0.25 MG/2ML nebulizer solution Commonly known as: PULMICORT Take 2 mLs (0.25 mg total) by nebulization daily for 7 days.   carvedilol 12.5 MG tablet Commonly known as: Coreg Take 1 tablet (12.5 mg total) by mouth 2 (two) times daily.   cetirizine 10 MG tablet Commonly known as: ZYRTEC Take 10 mg by mouth daily.   chlorthalidone 25 MG tablet Commonly known as: HYGROTON Take 1 tablet (25 mg total) by mouth daily.   cyanocobalamin 1000 MCG tablet Take 1 tablet (1,000 mcg total) by mouth daily.   furosemide 40 MG tablet Commonly known as: LASIX Take 1 tablet (40 mg total) by mouth 2 (two) times daily. What changed: when to take this   gabapentin 100 MG capsule Commonly known as: NEURONTIN Take 1 capsule (100 mg total) by mouth in the morning, at noon, and at bedtime.   glimepiride 2 MG tablet Commonly known as: AMARYL Take one tab po qd with supper   hydrALAZINE 100 MG tablet Commonly known as: APRESOLINE Take 1 tablet (100 mg total) by mouth 3 (three) times daily.   insulin glargine (1 Unit Dial) 300 UNIT/ML Solostar Pen Commonly known as: TOUJEO Inject 20 Units into the skin daily. Reduced from prior 26 units daily. What changed: how much to take   ipratropium-albuterol 0.5-2.5 (3) MG/3ML Soln Commonly known as: DUONEB Take 3 mLs by nebulization every 6 (six) hours as needed.   Novofine Pen Needle 32G X 6 MM Misc Generic drug: Insulin Pen Needle Use as directed   omeprazole 40 MG capsule Commonly known as: PRILOSEC Take 1 capsule (40 mg total) by mouth in the morning and at bedtime.   potassium chloride SA 20 MEQ tablet Commonly known as: KLOR-CON M Take 1 tablet (20 mEq total) by mouth 2 (two) times daily.   predniSONE 20 MG tablet Commonly known as: DELTASONE Take 3 tablets (60 mg total) by mouth daily for 5 days, THEN 2.5 tablets (50 mg total) daily for 5 days, THEN 2 tablets (40 mg total) daily for 5 days, THEN 1.5 tablets (30 mg total) daily  for 5 days, THEN 1 tablet (20 mg total) daily for 5 days, THEN 0.5 tablets (10 mg total) daily for 5 days. Start taking on: August 08, 2021   rosuvastatin 10 MG tablet Commonly known as: CRESTOR Take 1 tablet (10 mg total) by mouth at bedtime.   True Metrix Air Glucose Meter Devi 1 Device by Does not apply route 2 (two) times daily.   True Metrix Blood Glucose Test test strip Generic drug: glucose blood Use as instructed twice daily diag E11.65   True Metrix Level 1 Low Soln Use as directed   TRUEplus Lancets 33G Misc Use as directed twice daily diag E11.65         Discharge Exam: Filed Weights   08/05/21 1552 08/07/21 0500 08/08/21 0425  Weight: 119.7 kg 117.5 kg 116.7 kg   Vitals:   08/08/21 0815 08/08/21 1145  BP: (!) 142/79 135/79  Pulse: 100 69  Resp: 18 18  Temp: 98 F (36.7 C) 97.9 F (36.6 C)  SpO2: 96% 95%   Exam:  Constitutional:  The patient is awake, alert, and oriented x 3. No acute distress. Respiratory:  No increased work of breathing. No wheezes, rales, or rhonchi No tactile fremitus Cardiovascular:  Regular rate and rhythm No murmurs, ectopy, or gallups. No lateral PMI. No thrills. Abdomen:  Abdomen is soft, non-tender, non-distended No hernias, masses, or organomegaly Normoactive bowel sounds.  Musculoskeletal:  No cyanosis, clubbing, or edema Skin:  No rashes, lesions, ulcers palpation of skin: no induration or nodules Neurologic:  CN 2-12 intact Sensation all 4 extremities intact Psychiatric:  Mental status Mood, affect appropriate Orientation to person, place, time  judgment and insight appear intact   Condition at discharge: good  The results of significant diagnostics from this hospitalization (including imaging, microbiology, ancillary and laboratory) are listed below for reference.   Imaging Studies: DG Chest 2 View  Result Date: 08/07/2021 CLINICAL DATA:  Pt admitted to hospital for SOB on 2/23. Recent imaging  showing pleural effusion. Hx of CHF, COPD, CKD, diabetes, cardiomyopathy. EXAM: CHEST - 2 VIEW COMPARISON:  08/05/2021 and older studies. FINDINGS: Bilateral effusions and associated lung base opacities obscure the hemidiaphragms, unchanged. Bilateral vascular congestion and interstitial prominence is also unchanged. Cardiac silhouette is mostly obscured, mildly enlarged. No mediastinal or hilar masses. No pneumothorax. IMPRESSION: 1. No convincing change from the most recent prior study. 2. Bilateral pleural effusions with associated lung base opacity, lung base opacities most likely atelectasis. Cardiomegaly, vascular congestion with mild interstitial prominence suggests mild congestive heart failure. Electronically Signed   By: Lajean Manes M.D.   On: 08/07/2021 09:04   DG Chest 2 View  Result Date: 08/05/2021 CLINICAL DATA:  Chest pain, shortness of breath EXAM: CHEST - 2 VIEW COMPARISON:  Radiograph 06/27/2021 FINDINGS: Unchanged, enlarged cardiac silhouette. Diffuse interstitial opacities. Small bilateral pleural effusions with adjacent basilar opacities, likely atelectasis. No pneumothorax. No acute osseous abnormality. IMPRESSION: Mild pulmonary edema and small bilateral pleural effusions with adjacent basilar atelectasis. Electronically Signed   By: Maurine Simmering M.D.   On: 08/05/2021 16:51   ECHOCARDIOGRAM COMPLETE  Result Date: 08/08/2021    ECHOCARDIOGRAM REPORT   Patient Name:   REO PORTELA [facility] Date of Exam: 08/08/2021 Medical Rec #:  [id number]              Height:       71.0 in Accession #:    [id number]             Weight:       257.3 lb Date of Birth:  [birth year]              BSA:          2.347 m Patient Age:    66 years               BP:           140/72 mmHg Patient Gender: M                      HR:           92 bpm. Exam Location:  [name] Procedure: 2D Echo and Intracardiac Opacification Agent Indications:     Dyspnea R06.00  History:         Patient has prior history of  Echocardiogram examinations, most  recent 09/26/2020.  Sonographer:     Kathlen [place] RDCS Referring Phys:  [id number] ERIC J [place] Diagnosing Phys: Kate Sable MD  Sonographer Comments: Technically difficult study due to poor echo windows. Image acquisition challenging due to patient body habitus. IMPRESSIONS  1. Left ventricular ejection fraction, by estimation, is 40 to 45%. The left ventricle has mild to moderately decreased function. The left ventricle demonstrates global hypokinesis. There is mild left ventricular hypertrophy. Left ventricular diastolic parameters are indeterminate.  2. Right ventricular systolic function is low normal. The right ventricular size is not well visualized.  3. The mitral valve is grossly normal. No evidence of mitral valve regurgitation.  4. The aortic valve is grossly normal. Aortic valve regurgitation is not visualized. Aortic valve sclerosis is present, with no evidence of aortic valve stenosis.  5. The inferior vena cava is normal in size with greater than 50% respiratory variability, suggesting right atrial pressure of 3 mmHg. FINDINGS  Left Ventricle: Left ventricular ejection fraction, by estimation, is 40 to 45%. The left ventricle has mild to moderately decreased function. The left ventricle demonstrates global hypokinesis. Definity contrast agent was given IV to delineate the left  ventricular endocardial borders. The left ventricular internal cavity size was normal in size. There is mild left ventricular hypertrophy. Left ventricular diastolic parameters are indeterminate. Right Ventricle: The right ventricular size is not well visualized. No increase in right ventricular wall thickness. Right ventricular systolic function is low normal. Left Atrium: Left atrial size was normal in size. Right Atrium: Right atrial size was normal in size. Pericardium: There is no evidence of pericardial effusion. Mitral Valve: The mitral valve is grossly normal.  No evidence of mitral valve regurgitation. Tricuspid Valve: The tricuspid valve is normal in structure. Tricuspid valve regurgitation is not demonstrated. Aortic Valve: The aortic valve is grossly normal. Aortic valve regurgitation is not visualized. Aortic valve sclerosis is present, with no evidence of aortic valve stenosis. Aortic valve peak gradient measures 5.3 mmHg. Pulmonic Valve: The pulmonic valve was not well visualized. Pulmonic valve regurgitation is not visualized. Aorta: The aortic root and ascending aorta are structurally normal, with no evidence of dilitation. Venous: The inferior vena cava is normal in size with greater than 50% respiratory variability, suggesting right atrial pressure of 3 mmHg. IAS/Shunts: No atrial level shunt detected by color flow Doppler.  LEFT VENTRICLE PLAX 2D LVIDd:         5.20 cm      Diastology LVIDs:         3.75 cm      LV e' medial:    9.14 cm/s LV PW:         1.20 cm      LV E/e' medial:  9.8 LV IVS:        1.15 cm      LV e' lateral:   17.40 cm/s LVOT diam:     2.30 cm      LV E/e' lateral: 5.2 LV SV:         45 LV SV Index:   19 LVOT Area:     4.15 cm  LV Volumes (MOD) LV vol d, MOD A4C: 127.0 ml LV vol s, MOD A4C: 45.0 ml LV SV MOD A4C:     127.0 ml RIGHT VENTRICLE RV Basal diam:  3.70 cm RV S prime:     10.60 cm/s LEFT ATRIUM           Index  RIGHT ATRIUM           Index LA diam:      5.60 cm 2.39 cm/m   RA Area:     23.40 cm LA Vol (A4C): 67.0 ml 28.55 ml/m  RA Volume:   76.30 ml  32.51 ml/m  AORTIC VALVE                 PULMONIC VALVE AV Area (Vmax): 2.59 cm     PV Vmax:       1.03 m/s AV Vmax:        115.00 cm/s  PV Peak grad:  4.2 mmHg AV Peak Grad:   5.3 mmHg LVOT Vmax:      71.80 cm/s LVOT Vmean:     43.300 cm/s LVOT VTI:       0.108 m  AORTA Ao Root diam: 3.40 cm Ao Asc diam:  3.20 cm MITRAL VALVE MV Area (PHT): 4.15 cm    SHUNTS MV Decel Time: 183 msec    Systemic VTI:  0.11 m MV E velocity: 90.00 cm/s  Systemic Diam: 2.30 cm Kate Sable MD Electronically signed by Kate Sable MD Signature Date/Time: 08/08/2021/1:15:08 PM    Final     Microbiology: Results for orders placed or performed during the hospital encounter of 08/05/21  Resp Panel by RT-PCR (Flu A&B, Covid) Nasopharyngeal Swab     Status: None   Collection Time: 08/05/21  3:59 PM   Specimen: Nasopharyngeal Swab; Nasopharyngeal(NP) swabs in vial transport medium  Result Value Ref Range Status   SARS Coronavirus 2 by RT PCR NEGATIVE NEGATIVE Final    Comment: (NOTE) SARS-CoV-2 target nucleic acids are NOT DETECTED.  The SARS-CoV-2 RNA is generally detectable in upper respiratory specimens during the acute phase of infection. The lowest concentration of SARS-CoV-2 viral copies this assay can detect is 138 copies/mL. A negative result does not preclude SARS-Cov-2 infection and should not be used as the sole basis for treatment or other patient management decisions. A negative result may occur with  improper specimen collection/handling, submission of specimen other than nasopharyngeal swab, presence of viral mutation(s) within the areas targeted by this assay, and inadequate number of viral copies(<138 copies/mL). A negative result must be combined with clinical observations, patient history, and epidemiological information. The expected result is Negative.  Fact Sheet for Patients:  EntrepreneurPulse.com.au  Fact Sheet for Healthcare Providers:  IncredibleEmployment.be  This test is no t yet approved or cleared by the [place] FDA and  has been authorized for detection and/or diagnosis of SARS-CoV-2 by FDA under an Emergency Use Authorization (EUA). This EUA will remain  in effect (meaning this test can be used) for the duration of the COVID-19 declaration under Section 564(b)(1) of the Act, 21 U.S.C.section 360bbb-3(b)(1), unless the authorization is terminated  or revoked sooner.       Influenza  A by PCR NEGATIVE NEGATIVE Final   Influenza B by PCR NEGATIVE NEGATIVE Final    Comment: (NOTE) The Xpert Xpress SARS-CoV-2/FLU/RSV plus assay is intended as an aid in the diagnosis of influenza from Nasopharyngeal swab specimens and should not be used as a sole basis for treatment. Nasal washings and aspirates are unacceptable for Xpert Xpress SARS-CoV-2/FLU/RSV testing.  Fact Sheet for Patients: EntrepreneurPulse.com.au  Fact Sheet for Healthcare Providers: IncredibleEmployment.be  This test is not yet approved or cleared by the [place] FDA and has been authorized for detection and/or diagnosis of SARS-CoV-2 by FDA under an Emergency Use Authorization (EUA).  This EUA will remain in effect (meaning this test can be used) for the duration of the COVID-19 declaration under Section 564(b)(1) of the Act, 21 U.S.C. section 360bbb-3(b)(1), unless the authorization is terminated or revoked.  Performed at [facility], [address] [place], [place] [address]     Labs: CBC: Recent Labs  Lab 08/05/21 1554 08/06/21 0325 08/07/21 0423  WBC 6.9 5.2 10.3  HGB 9.4* 8.8* 9.1*  HCT 30.6* 28.6* 28.9*  MCV 82.0 82.7 80.1  PLT 226 220 511   Basic Metabolic Panel: Recent Labs  Lab 08/05/21 1554 08/06/21 0325 08/07/21 0423 08/08/21 1034  NA 133* 134* 133* 134*  K 4.3 4.2 3.8 3.3*  CL 101 101 97* 94*  CO2 22 23 24 24   GLUCOSE 166* 377* 386* 458*  BUN 49* 58* 71* 89*  CREATININE 2.50* 2.55* 2.55* 2.68*  CALCIUM 8.1* 8.3* 8.2* 8.4*  MG  --  1.9 1.8  --    Liver Function Tests: Recent Labs  Lab 08/06/21 0325  AST 19  ALT 20  ALKPHOS 61  BILITOT 0.6  PROT 6.7  ALBUMIN 2.5*   CBG: Recent Labs  Lab 08/07/21 1631 08/07/21 2038 08/08/21 0604 08/08/21 0811 08/08/21 1113  GLUCAP 381* 397* 308* 310* 435*    Discharge time spent: greater than 30 minutes.  Signed: Karie Kirks, DO [facility] 08/08/2021 5:48  PM

## 2021-08-08 NOTE — Assessment & Plan Note (Addendum)
Creatinine is at baseline at 2.55. The patient is not a candidate for ACE I or ARB due to CKD.

## 2021-08-09 ENCOUNTER — Telehealth: Payer: Self-pay

## 2021-08-09 NOTE — Progress Notes (Signed)
PROGRESS NOTE  Larry Callahan WSF:[id number] DOB: May 27, 1956 DOA: 08/05/2021 PCP: [name], NP  Brief History   Larry Callahan is a 66 year old male with past medical history significant for nonischemic cardiomyopathy (EF 35-40%), COPD, CKD stage IV, chronic anemia, essential hypertension, DM2, OSA who presents to [facility] ED on 2/23 with progressive shortness of breath.  Onset 2 days prior, now with difficulty walking with associated orthopnea.  Denies cough, no fever/chills.   In the ED, temperature 99.5 F, HR 107, RR 26, BP 158/88, SPO2 99% on 3 L nasal cannula.  Sodium 133, potassium 4.3, chloride 101, CO2 22, glucose 166, BUN 49, creatinine 2.50.  WBC 6.9, hemoglobin 9.4, platelets 226.  BNP 72.4, hs-troponin 14>14.  Influenza A/B PCR negative.  COVID-19 PCR negative.  Chest x-ray with mild pulmonary edema, small bilateral pleural effusions with adjacent bibasilar atelectasis.   [facility] were consulted to admit the patient for further evaluation and treatment.  He was admitted to a telemetry bed. He is being treated with diuresis, steroids, and nebulizer treatments. He is currently requiring 3L O2 to maintain oxygenation greater than90%.  Consultants  None  Procedures  None  Antibiotics   Anti-infectives (From admission, onward)    None      Subjective  The patient is resting comfortably. He has conversational dyspnea.  Objective   Vitals:  Vitals:   08/08/21 0815 08/08/21 1145  BP: (!) 142/79 135/79  Pulse: 100 69  Resp: 18 18  Temp: 98 F (36.7 C) 97.9 F (36.6 C)  SpO2: 96% 95%    Exam:  Constitutional:  The patient is awake, alert, and oriented x 3. No acute distress. Eyes:  pupils and irises appear normal Normal lids and conjunctivae ENMT:  grossly normal hearing  Lips appear normal external ears, nose appear normal Oropharynx: mucosa, tongue,posterior pharynx appear normal Neck:  neck appears normal, no masses, normal  ROM, supple no thyromegaly Respiratory:  Positive for mildly increased work of breathing Diminished breath sounds bilaterally No wheezes or rhonchi Positive for small rales at bases No tactile fremitus Cardiovascular:  Regular rate and rhythm No murmurs, ectopy, or gallups. No lateral PMI. No thrills. Abdomen:  Abdomen is soft, non-tender, non-distended No hernias, masses, or organomegaly Normoactive bowel sounds.  Musculoskeletal:  No cyanosis, clubbing, or edema Skin:  No rashes, lesions, ulcers palpation of skin: no induration or nodules Neurologic:  CN 2-12 intact Sensation all 4 extremities intact Psychiatric:  Mental status Mood, affect appropriate Orientation to person, place, time  judgment and insight appear intact   I have personally reviewed the following:   Today's Data  Vitals  Lab Data  CBC BMP  Micro Data    Imaging  CXR  Cardiology Data  EKG  Other Data    Scheduled Meds: Continuous Infusions:  Principal Problem:   Acute on chronic systolic CHF (congestive heart failure) (HCC) Active Problems:   Obstructive sleep apnea, adult   Essential hypertension   Type 2 diabetes mellitus with hyperglycemia (HCC)   CKD (chronic kidney disease), stage IV (HCC)   COPD with acute exacerbation (HCC)   PAD (peripheral artery disease) ([place])   LOS: 3 days   A & P  Assessment and Plan: Assessment and Plan: * Acute on chronic systolic CHF (congestive heart failure) ([place])- (present on admission) Patient presenting with progressive shortness of breath with known last LVEF 35-40%, LV moderately decreased function, LV dilated with mild MR 09/26/2020.  BMP within normal limits, although chest x-ray notable  for pulmonary edema and bilateral pleural effusions. --Repeat TTE --Carvedilol 12.5 mg p.o. twice daily --Furosemide 40 mg IV q12h --Strict I's and O's and daily weights --BMP daily  PAD (peripheral artery disease) ([place])- (present on  admission) Noted.   COPD with acute exacerbation ([place])- (present on admission) The patient is receiving IV steroids and nebulizer treatments.  CKD (chronic kidney disease), stage IV ([place])- (present on admission) Creatinine is at baseline at 2.55. The patient is not a candidate for ACE I or ARB due to CKD.  Type 2 diabetes mellitus with hyperglycemia ([place])- (present on admission) The patient has been having high glucoses likely due to steroids. This has been covered by [employer].   Essential hypertension- (present on admission) Blood pressures are normotensive. Continue to monitor.    I have seen and examined this patient myself. I have spent 34 minutes in his evaluation an care.  DVT prophylaxis: Lovenox Code Status: Full Code Family Communication: None available Disposition Plan: home    Britney Newstrom, DO [facility] Direct contact: see [email or web address]  7PM-7AM contact night coverage as above 08/07/2021, 2:31 PM  LOS: 3 days

## 2021-08-09 NOTE — Telephone Encounter (Signed)
Larry Callahan, [phone], verbal orders to resume nursing once a week for 4 weeks

## 2021-08-10 DIAGNOSIS — I5022 Chronic systolic (congestive) heart failure: Secondary | ICD-10-CM | POA: Diagnosis not present

## 2021-08-10 DIAGNOSIS — I10 Essential (primary) hypertension: Secondary | ICD-10-CM | POA: Diagnosis not present

## 2021-08-11 ENCOUNTER — Ambulatory Visit (INDEPENDENT_AMBULATORY_CARE_PROVIDER_SITE_OTHER): Payer: Medicare PPO | Admitting: Nurse Practitioner

## 2021-08-11 ENCOUNTER — Encounter: Payer: Self-pay | Admitting: Nurse Practitioner

## 2021-08-11 ENCOUNTER — Telehealth: Payer: Self-pay

## 2021-08-11 ENCOUNTER — Other Ambulatory Visit: Payer: Self-pay

## 2021-08-11 VITALS — BP 140/80 | HR 62 | Temp 98.1°F | Resp 16 | Ht 71.0 in | Wt 259.0 lb

## 2021-08-11 DIAGNOSIS — E1140 Type 2 diabetes mellitus with diabetic neuropathy, unspecified: Secondary | ICD-10-CM

## 2021-08-11 DIAGNOSIS — I700 Atherosclerosis of aorta: Secondary | ICD-10-CM

## 2021-08-11 DIAGNOSIS — N184 Chronic kidney disease, stage 4 (severe): Secondary | ICD-10-CM

## 2021-08-11 DIAGNOSIS — N1832 Chronic kidney disease, stage 3b: Secondary | ICD-10-CM | POA: Diagnosis not present

## 2021-08-11 DIAGNOSIS — E1122 Type 2 diabetes mellitus with diabetic chronic kidney disease: Secondary | ICD-10-CM

## 2021-08-11 DIAGNOSIS — I10 Essential (primary) hypertension: Secondary | ICD-10-CM

## 2021-08-11 DIAGNOSIS — I5022 Chronic systolic (congestive) heart failure: Secondary | ICD-10-CM | POA: Diagnosis not present

## 2021-08-11 DIAGNOSIS — J449 Chronic obstructive pulmonary disease, unspecified: Secondary | ICD-10-CM

## 2021-08-11 DIAGNOSIS — Z794 Long term (current) use of insulin: Secondary | ICD-10-CM | POA: Diagnosis not present

## 2021-08-11 MED ORDER — HYDRALAZINE HCL 100 MG PO TABS: 100.0000 mg | ORAL_TABLET | Freq: Three times a day (TID) | ORAL | 1 refills | Status: DC

## 2021-08-11 MED ORDER — CHLORTHALIDONE 25 MG PO TABS: 25.0000 mg | ORAL_TABLET | Freq: Every day | ORAL | 3 refills | Status: DC

## 2021-08-11 MED ORDER — GABAPENTIN 100 MG PO CAPS: 100.0000 mg | ORAL_CAPSULE | Freq: Three times a day (TID) | ORAL | 1 refills | Status: DC

## 2021-08-11 MED ORDER — AMLODIPINE BESYLATE 10 MG PO TABS: 10.0000 mg | ORAL_TABLET | Freq: Every day | ORAL | 1 refills | Status: DC

## 2021-08-11 MED ORDER — ROSUVASTATIN CALCIUM 10 MG PO TABS: 10.0000 mg | ORAL_TABLET | Freq: Every day | ORAL | 1 refills | Status: DC

## 2021-08-11 NOTE — Telephone Encounter (Signed)
Sent via proficient to [place] ?

## 2021-08-11 NOTE — Progress Notes (Signed)
[facility] [place] [address] [facility] Discharge Acute Issues Care Follow Up                                                                        Patient Demographics  Larry Callahan, is a 66 y.o. male  DOB 25-Jan-1956  MRN [id number].  Primary MD  [name], NP   Reason for TCC follow Up - COPD with acute exacerbation and acute on chronic systolic congestive heart failure   Past Medical History:  Diagnosis Date   Allergy    Chronic combined systolic (congestive) and diastolic (congestive) heart failure ([place])    a. 04/2019 Echo: EF 45-50%; b. 02/2019 Echo: EF 55-60%; c. 09/2020 Echo: EF 35-40%, glob HK. Nl RV size/fxn. Mild MR.   CKD (chronic kidney disease), stage III (HCC)    COPD (chronic obstructive pulmonary disease) ([place])    Coronary artery disease    a. 2011 Cath: nonobs dzs; b. 09/2020 MV: EF 38%. No signif ischemia. ? inf MI vs diaph attenuation. GI uptake artifact.   Diabetes mellitus without complication ([place])    GERD (gastroesophageal reflux disease)    Hyperlipidemia    Hypertension    Morbid obesity ([place])    NICM (nonischemic cardiomyopathy) ([place])    a. 04/2019 Echo: EF 45-50%; b. 02/2019 Echo: EF 55-60%; c. 09/2020 Echo: EF 35-40%, glob HK; d. 09/2020 MV: No ischemia. ? inf infarct vs attenuation.   OSA (obstructive sleep apnea)     Past Surgical History:  Procedure Laterality Date   BACK SURGERY     CARDIAC CATHETERIZATION     CHOLECYSTECTOMY     COLONOSCOPY WITH PROPOFOL N/A 04/12/2019   Procedure: COLONOSCOPY WITH PROPOFOL;  Surgeon: [name], MD;  Location: [facility] ENDOSCOPY;  Service: Gastroenterology;  Laterality: N/A;   ESOPHAGOGASTRODUODENOSCOPY N/A 04/12/2019   Procedure: ESOPHAGOGASTRODUODENOSCOPY (EGD);  Surgeon: [name], MD;  Location: [facility] ENDOSCOPY;  Service: Gastroenterology;   Laterality: N/A;   ESOPHAGOGASTRODUODENOSCOPY (EGD) WITH PROPOFOL N/A 11/21/2019   Procedure: ESOPHAGOGASTRODUODENOSCOPY (EGD) WITH PROPOFOL;  Surgeon: [name], MD;  Location: [facility] ENDOSCOPY;  Service: Gastroenterology;  Laterality: N/A;   FLEXIBLE BRONCHOSCOPY Bilateral 05/17/2019   Procedure: FLEXIBLE BRONCHOSCOPY;  Surgeon: [name], MD;  Location: [name] ORS;  Service: Pulmonary;  Laterality: Bilateral;   left arm surgery     nephrectomy Left    PARATHYROIDECTOMY     RIGHT HEART CATH N/A 03/29/2019   Procedure: RIGHT HEART CATH;  Surgeon: [name], MD;  Location: [place] CV LAB;  Service: Cardiovascular;  Laterality: N/A;       Recent HPI and Hospital Course  Hospital Course: The patient was admitted to a telemetry bed. He required 3 L O2 to maintain saturations in the 90's on admission. He has been on room air for two days after diuresis and treatment for his COPD exacerbation. He was given IV lasix, steroids, and nebulizer treatments. He was diuresed. His fluid  balance is negative 4.9 L.    Echocardiogram was obtained. It demonstrated LVEF 40-45%. There is mild to moderately decreased function of the left ventricle. The diastolic parameters are indeterminate. The RV systolic function is low normal.    The patient will be discharged to home today in fair condition. He will follow up with Dr. Rockey Situ and keep his appointment with the heart failure clinic. He is not discharged on lisinopril due to his chronic kidney disease.   Assessment and Plan: * Acute on chronic systolic CHF (congestive heart failure) ([place])- (present on admission) The patient was admitted and started on lasix 40  Mg bid. He has diuresed well and is negative 4.9 L at discharge.   Echocardiogram was obtained. It demonstrated LVEF 40-45%. There is mild to moderately decreased function of the left ventricle. The diastolic parameters are indeterminate. The RV systolic function is low normal.   PAD (peripheral artery disease) ([place])- (present on admission) COPD with acute exacerbation ([place])- (present on admission) The patient has received IV steroids and nebulizer treatments. The steroids have been converted to oral prednisone and will be continued as a taper at discharge. CKD (chronic kidney disease), stage IV ([place])- (present on admission) Creatinine is at baseline at 2.68. The patient is not a candidate for ACE I or ARB due to CKD. Type 2 diabetes mellitus with hyperglycemia ([place])- (present on admission) The patient has been continued on lantus at discharge.  Essential hypertension- (present on admission) Blood pressures are normotensive. Continue to monitor.   [facility] Acute Care Issue to be followed in the Clinic   Principal Problem:   Acute on chronic systolic CHF (congestive heart failure) (HCC) Active Problems:   Obstructive sleep apnea, adult   Essential hypertension   Type 2 diabetes mellitus with hyperglycemia (HCC)   CKD (chronic kidney disease), stage IV (HCC)   COPD with acute exacerbation (HCC)   PAD (peripheral artery disease) (HCC)   Acute CHF (congestive heart failure) (HCC)   Subjective:   Larry Callahan today has, No headache, No chest pain, No abdominal pain - No Nausea, No new weakness tingling or numbness, No Cough - SOB.   Assessment & Plan   1. Chronic obstructive pulmonary disease, unspecified COPD type ([place]) Patient's respiratory status is stable at this time, he is is not short of breath and he has no wheezing.  Continue Breztri inhaler 2 puffs twice daily as prescribed, samples given due to the patient's financial status per his request  2. Chronic systolic CHF (congestive heart failure) ([place]) Repeat labs ordered as recommended by the hospitalist at discharge.  All of the patient's medications were reviewed with him in detail today at his office visit.  He has an upcoming appointment scheduled with Darylene Price NP at the heart failure  clinic at [place]  3. Stage 4 chronic kidney disease ([place]) Serum creatinine continues to be elevated in the high twos and most recent GFR was 28 which is considered stage IV CKD.  Patient has not been seeing a nephrologist.  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  It is highly recommended that the patient starts following up with a nephrologist again due to the decreased function of the remaining kidney.  Referral ordered for nephrology with Dr. Juleen China. - Ambulatory referral to Nephrology  4. Type 2 diabetes mellitus with stage 4 chronic kidney disease, with long-term current use of insulin (HCC) Repeat metabolic panel ordered as recommended by the  hospitalist at discharge - CMP14+EGFR  5. Primary hypertension Blood pressure is stable.  Continue amlodipine, hydralazine and chlorthalidone as prescribed, refills ordered. - amLODipine (NORVASC) 10 MG tablet; Take 1 tablet (10 mg total) by mouth daily.  Dispense: 90 tablet; Refill: 1 - chlorthalidone (HYGROTON) 25 MG tablet; Take 1 tablet (25 mg total) by mouth daily.  Dispense: 30 tablet; Refill: 3 - hydrALAZINE (APRESOLINE) 100 MG tablet; Take 1 tablet (100 mg total) by mouth 3 (three) times daily.  Dispense: 270 tablet; Refill: 1  6. Atherosclerosis of aorta (HCC) Takes rosuvastatin 10 mg daily, continue as prescribed. - rosuvastatin (CRESTOR) 10 MG tablet; Take 1 tablet (10 mg total) by mouth at bedtime.  Dispense: 90 tablet; Refill: 1  7. Neuropathy due to type 2 diabetes mellitus (HCC) Refills ordered - gabapentin (NEURONTIN) 100 MG capsule; Take 1 capsule (100 mg total) by mouth in the morning, at noon, and at bedtime.  Dispense: 270 capsule; Refill: 1    Reason for frequent admissions/ER visits      Systolic congestive heart failure COPD Chronic kidney disease Type 2 diabetes Hypertension   Objective:   Vitals:   08/11/21 1004  BP: 140/80  Pulse: 62  Resp: 16  Temp: 98.1 F (36.7  C)  SpO2: 98%  Weight: 259 lb (117.5 kg)  Height: _0  (1.803 m)    Wt Readings from Last 3 Encounters:  08/11/21 259 lb (117.5 kg)  08/08/21 257 lb 4.4 oz (116.7 kg)  07/14/21 264 lb 12.8 oz (120.1 kg)    Allergies as of 08/11/2021       Reactions   Penicillins Rash        Medication List        Accurate as of August 11, 2021 12:54 PM. If you have any questions, ask your nurse or doctor.          amLODipine 10 MG tablet Commonly known as: NORVASC Take 1 tablet (10 mg total) by mouth daily.   apixaban 2.5 MG Tabs tablet Commonly known as: ELIQUIS Take 1 tablet (2.5 mg total) by mouth 2 (two) times daily.   Breztri Aerosphere 160-9-4.8 MCG/ACT Aero Generic drug: Budeson-Glycopyrrol-Formoterol Inhale 2 puffs into the lungs in the morning and at bedtime.   budesonide 0.25 MG/2ML nebulizer solution Commonly known as: PULMICORT Take 2 mLs (0.25 mg total) by nebulization daily for 7 days.   carvedilol 12.5 MG tablet Commonly known as: Coreg Take 1 tablet (12.5 mg total) by mouth 2 (two) times daily.   cetirizine 10 MG tablet Commonly known as: ZYRTEC Take 10 mg by mouth daily.   chlorthalidone 25 MG tablet Commonly known as: HYGROTON Take 1 tablet (25 mg total) by mouth daily.   cyanocobalamin 1000 MCG tablet Take 1 tablet (1,000 mcg total) by mouth daily.   furosemide 40 MG tablet Commonly known as: LASIX Take 1 tablet (40 mg total) by mouth 2 (two) times daily.   gabapentin 100 MG capsule Commonly known as: NEURONTIN Take 1 capsule (100 mg total) by mouth in the morning, at noon, and at bedtime.   glimepiride 2 MG tablet Commonly known as: AMARYL Take one tab po qd with supper   hydrALAZINE 100 MG tablet Commonly known as: APRESOLINE Take 1 tablet (100 mg total) by mouth 3 (three) times daily.   insulin glargine (1 Unit Dial) 300 UNIT/ML Solostar Pen Commonly known as: TOUJEO Inject 20 Units into the skin daily. Reduced from prior 26 units  daily.   ipratropium-albuterol 0.5-2.5 (  3) MG/3ML Soln Commonly known as: DUONEB Take 3 mLs by nebulization every 6 (six) hours as needed.   Novofine Pen Needle 32G X 6 MM Misc Generic drug: Insulin Pen Needle Use as directed   omeprazole 40 MG capsule Commonly known as: PRILOSEC Take 1 capsule (40 mg total) by mouth in the morning and at bedtime.   potassium chloride SA 20 MEQ tablet Commonly known as: KLOR-CON M Take 1 tablet (20 mEq total) by mouth 2 (two) times daily.   predniSONE 20 MG tablet Commonly known as: DELTASONE Take 3 tablets (60 mg total) by mouth daily for 5 days, THEN 2.5 tablets (50 mg total) daily for 5 days, THEN 2 tablets (40 mg total) daily for 5 days, THEN 1.5 tablets (30 mg total) daily for 5 days, THEN 1 tablet (20 mg total) daily for 5 days, THEN 0.5 tablets (10 mg total) daily for 5 days. Start taking on: August 08, 2021   rosuvastatin 10 MG tablet Commonly known as: CRESTOR Take 1 tablet (10 mg total) by mouth at bedtime.   True Metrix Air Glucose Meter Devi 1 Device by Does not apply route 2 (two) times daily.   True Metrix Blood Glucose Test test strip Generic drug: glucose blood Use as instructed twice daily diag E11.65   True Metrix Level 1 Low Soln Use as directed   TRUEplus Lancets 33G Misc Use as directed twice daily diag E11.65         Physical Exam: Constitutional: Patient appears well-developed and well-nourished. Not in obvious distress. HENT: Normocephalic, atraumatic, External right and left ear normal. Oropharynx is clear and moist.  Eyes: Conjunctivae and EOM are normal. PERRLA, no scleral icterus. Neck: Normal ROM. Neck supple. No JVD. No tracheal deviation. No thyromegaly. CVS: RRR, S1/S2 +, no murmurs, no gallops, no carotid bruit.  Pulmonary: Effort and breath sounds normal, no stridor, rhonchi, wheezes, rales.  Abdominal: Soft. BS +, no distension, tenderness, rebound or guarding.  Musculoskeletal: Normal range of  motion. No edema and no tenderness.  Lymphadenopathy: No lymphadenopathy noted, cervical, inguinal or axillary Neuro: Alert. Normal reflexes, muscle tone coordination. No cranial nerve deficit. Skin: Skin is warm and dry. No rash noted. Not diaphoretic. No erythema. No pallor. Psychiatric: Normal mood and affect. Behavior, judgment, thought content normal.   Data Review   Micro Results Recent Results (from the past 240 hour(s))  Resp Panel by RT-PCR (Flu A&B, Covid) Nasopharyngeal Swab     Status: None   Collection Time: 08/05/21  3:59 PM   Specimen: Nasopharyngeal Swab; Nasopharyngeal(NP) swabs in vial transport medium  Result Value Ref Range Status   SARS Coronavirus 2 by RT PCR NEGATIVE NEGATIVE Final    Comment: (NOTE) SARS-CoV-2 target nucleic acids are NOT DETECTED.  The SARS-CoV-2 RNA is generally detectable in upper respiratory specimens during the acute phase of infection. The lowest concentration of SARS-CoV-2 viral copies this assay can detect is 138 copies/mL. A negative result does not preclude SARS-Cov-2 infection and should not be used as the sole basis for treatment or other patient management decisions. A negative result may occur with  improper specimen collection/handling, submission of specimen other than nasopharyngeal swab, presence of viral mutation(s) within the areas targeted by this assay, and inadequate number of viral copies(<138 copies/mL). A negative result must be combined with clinical observations, patient history, and epidemiological information. The expected result is Negative.  Fact Sheet for Patients:  EntrepreneurPulse.com.au  Fact Sheet for Healthcare Providers:  IncredibleEmployment.be  This test  is no t yet approved or cleared by the [place] and  has been authorized for detection and/or diagnosis of SARS-CoV-2 by FDA under an Emergency Use Authorization (EUA). This EUA will remain  in effect  (meaning this test can be used) for the duration of the COVID-19 declaration under Section 564(b)(1) of the Act, 21 U.S.C.section 360bbb-3(b)(1), unless the authorization is terminated  or revoked sooner.       Influenza A by PCR NEGATIVE NEGATIVE Final   Influenza B by PCR NEGATIVE NEGATIVE Final    Comment: (NOTE) The Xpert Xpress SARS-CoV-2/FLU/RSV plus assay is intended as an aid in the diagnosis of influenza from Nasopharyngeal swab specimens and should not be used as a sole basis for treatment. Nasal washings and aspirates are unacceptable for Xpert Xpress SARS-CoV-2/FLU/RSV testing.  Fact Sheet for Patients: EntrepreneurPulse.com.au  Fact Sheet for Healthcare Providers: IncredibleEmployment.be  This test is not yet approved or cleared by the [place] FDA and has been authorized for detection and/or diagnosis of SARS-CoV-2 by FDA under an Emergency Use Authorization (EUA). This EUA will remain in effect (meaning this test can be used) for the duration of the COVID-19 declaration under Section 564(b)(1) of the Act, 21 U.S.C. section 360bbb-3(b)(1), unless the authorization is terminated or revoked.  Performed at [facility], [address] [place], [place] [address]      CBC Recent Labs  Lab 08/05/21 1554 08/06/21 0325 08/07/21 0423  WBC 6.9 5.2 10.3  HGB 9.4* 8.8* 9.1*  HCT 30.6* 28.6* 28.9*  PLT 226 220 252  MCV 82.0 82.7 80.1  MCH 25.2* 25.4* 25.2*  MCHC 30.7 30.8 31.5  RDW 15.7* 15.8* 15.3    Chemistries  Recent Labs  Lab 08/05/21 1554 08/06/21 0325 08/07/21 0423 08/08/21 1034  NA 133* 134* 133* 134*  K 4.3 4.2 3.8 3.3*  CL 101 101 97* 94*  CO2 _0 GLUCOSE 166* 377* 386* 458*  BUN 49* 58* 71* 89*  CREATININE 2.50* 2.55* 2.55* 2.68*  CALCIUM 8.1* 8.3* 8.2* 8.4*  MG  --  1.9 1.8  --   AST  --  19  --   --   ALT  --  20  --   --   ALKPHOS  --  61  --   --   BILITOT  --  0.6  --    --    ------------------------------------------------------------------------------------------------------------------ estimated creatinine clearance is 34.4 mL/min (A) (by C-G formula based on SCr of 2.68 mg/dL (H)). ------------------------------------------------------------------------------------------------------------------ No results for input(s): HGBA1C in the last 72 hours. ------------------------------------------------------------------------------------------------------------------ No results for input(s): CHOL, HDL, LDLCALC, TRIG, CHOLHDL, LDLDIRECT in the last 72 hours. ------------------------------------------------------------------------------------------------------------------ No results for input(s): TSH, T4TOTAL, T3FREE, THYROIDAB in the last 72 hours.  Invalid input(s): FREET3 ------------------------------------------------------------------------------------------------------------------ No results for input(s): VITAMINB12, FOLATE, FERRITIN, TIBC, IRON, RETICCTPCT in the last 72 hours.  Coagulation profile No results for input(s): INR, PROTIME in the last 168 hours.  No results for input(s): DDIMER in the last 72 hours.  Cardiac Enzymes No results for input(s): CKMB, TROPONINI, MYOGLOBIN in the last 168 hours.  Invalid input(s): CK ------------------------------------------------------------------------------------------------------------------ Invalid input(s): POCBNP  Return in about 3 months (around 11/11/2021) for F/U, Recheck A1C, Khiana Camino PCP.  [place] Controlled Substance Database was reviewed by me for overdose risk score (ORS)  Time Spent in minutes  45 Time spent with patient included reviewing progress notes, labs, imaging studies, and discussing plan for follow up.   This patient was seen  by [name], FNP-C in collaboration with Dr. Clayborn Bigness as a part of collaborative care agreement.   [name] MSN, FNP-C on 08/11/2021 at 12:54  PM   **Disclaimer: This note may have been dictated with voice recognition software. Similar sounding words can inadvertently be transcribed and this note may contain transcription errors which may not have been corrected upon publication of note.**

## 2021-08-11 NOTE — Telephone Encounter (Signed)
Awaiting 08/11/21 office notes for nephrology referral-[name] ?

## 2021-08-12 ENCOUNTER — Telehealth: Payer: Self-pay

## 2021-08-12 NOTE — Telephone Encounter (Signed)
[facility] home health nurse [phone] called that Pt glucose meter was showing high not reading and pt its on prednisone as per Alyssa advised nurse if she can gave him 10 more unit of  Toujeo now and from tomorrow while he is on prednisone he can take 30 units of Toujeo daily and then go back to normal dosing  ?

## 2021-08-12 NOTE — Telephone Encounter (Signed)
[facility] nurse called back and like to know that pt taking glimepiride at bedtime advised her that please advised pt that he had to take glimepiride with supper not at bedtime  ?

## 2021-08-13 ENCOUNTER — Telehealth: Payer: Self-pay | Admitting: Student-PharmD

## 2021-08-13 NOTE — Progress Notes (Signed)
?  Chronic Care Management ?Pharmacy Assistant  ? ?Name: Larry Callahan  MRN: [id number] DOB: 1956/05/07 ? ?Reason for Encounter: Care Plan and Patient Handout  ? ?Medications: ?Outpatient Encounter Medications as of 08/13/2021  ?Medication Sig  ? amLODipine (NORVASC) 10 MG tablet Take 1 tablet (10 mg total) by mouth daily.  ? apixaban (ELIQUIS) 2.5 MG TABS tablet Take 1 tablet (2.5 mg total) by mouth 2 (two) times daily.  ? Blood Glucose Calibration (TRUE METRIX LEVEL 1) Low SOLN Use as directed  ? Blood Glucose Monitoring Suppl (TRUE METRIX AIR GLUCOSE METER) DEVI 1 Device by Does not apply route 2 (two) times daily.  ? Budeson-Glycopyrrol-Formoterol (BREZTRI AEROSPHERE) 160-9-4.8 MCG/ACT AERO Inhale 2 puffs into the lungs in the morning and at bedtime.  ? budesonide (PULMICORT) 0.25 MG/2ML nebulizer solution Take 2 mLs (0.25 mg total) by nebulization daily for 7 days.  ? carvedilol (COREG) 12.5 MG tablet Take 1 tablet (12.5 mg total) by mouth 2 (two) times daily.  ? cetirizine (ZYRTEC) 10 MG tablet Take 10 mg by mouth daily.  ? chlorthalidone (HYGROTON) 25 MG tablet Take 1 tablet (25 mg total) by mouth daily.  ? furosemide (LASIX) 40 MG tablet Take 1 tablet (40 mg total) by mouth 2 (two) times daily.  ? gabapentin (NEURONTIN) 100 MG capsule Take 1 capsule (100 mg total) by mouth in the morning, at noon, and at bedtime.  ? glimepiride (AMARYL) 2 MG tablet Take one tab po qd with supper  ? glucose blood (TRUE METRIX BLOOD GLUCOSE TEST) test strip Use as instructed twice daily diag E11.65  ? hydrALAZINE (APRESOLINE) 100 MG tablet Take 1 tablet (100 mg total) by mouth 3 (three) times daily.  ? insulin glargine, 1 Unit Dial, (TOUJEO) 300 UNIT/ML Solostar Pen Inject 20 Units into the skin daily. Reduced from prior 26 units daily.  ? ipratropium-albuterol (DUONEB) 0.5-2.5 (3) MG/3ML SOLN Take 3 mLs by nebulization every 6 (six) hours as needed.  ? NOVOFINE PEN NEEDLE 32G X 6 MM MISC Use as directed  ? omeprazole  (PRILOSEC) 40 MG capsule Take 1 capsule (40 mg total) by mouth in the morning and at bedtime.  ? potassium chloride SA (KLOR-CON M) 20 MEQ tablet Take 1 tablet (20 mEq total) by mouth 2 (two) times daily.  ? predniSONE (DELTASONE) 20 MG tablet Take 3 tablets (60 mg total) by mouth daily for 5 days, THEN 2.5 tablets (50 mg total) daily for 5 days, THEN 2 tablets (40 mg total) daily for 5 days, THEN 1.5 tablets (30 mg total) daily for 5 days, THEN 1 tablet (20 mg total) daily for 5 days, THEN 0.5 tablets (10 mg total) daily for 5 days.  ? rosuvastatin (CRESTOR) 10 MG tablet Take 1 tablet (10 mg total) by mouth at bedtime.  ? TRUEplus Lancets 33G MISC Use as directed twice daily diag E11.65  ? vitamin B-12 1000 MCG tablet Take 1 tablet (1,000 mcg total) by mouth daily.  ? ?No facility-administered encounter medications on file as of 08/13/2021.  ? ?Reviewed the patients visit reinsured it was completed per the pharmacist Alena Bills request. Printed the CCM care plan. Mailed the patient CCM care plan and patient handout to their most recent address on file. ? ?Charlann Lange, RMA ?Healthcare Concierge  ?[phone] ? ?

## 2021-08-17 ENCOUNTER — Telehealth: Payer: Self-pay

## 2021-08-17 NOTE — Telephone Encounter (Signed)
As per dr Humphrey Rolls advised pt that stopped prednisone due to pt glucose is 600 and also advised to continue other med as prescribed we will call him at 5 clock and also we will nurse know to call  ?

## 2021-08-17 NOTE — Telephone Encounter (Signed)
LMOM to [facility] home health nurse to call us back IF she call back we need to let her know that we advised pt to stopped prednisone and continue Toujeo 30 units and glimepiride as prescribed and call us back tomorrow when  she visit pt ?

## 2021-08-17 NOTE — Telephone Encounter (Signed)
Called pt and checked on what his blood sugar was and at 445 pm it was at 574.  Tried to go over different food for pt to choose to eat as a better diet to help bring his sugars lower and also advised pt to drink plenty of water.  Instructed pt to keep track of his BS and check when fasting first thing in the morning and either 2 hours before a meal or 2 hours after.  Advised pt that his nurse will be coming to see him 08/18/21 and if his sugars started going back up to call 911 and go to ER ?

## 2021-08-17 NOTE — Telephone Encounter (Signed)
Spoke with home health nurse from [facility]  [phone] advised her that pt advised by desha to take glimepiride 2 mg now and take 1 tab po with supper and also from tomorrow he take glimepiride 1 tab bid and nurse said she is going tomorrow to see pt she will give [name] updates  ?

## 2021-08-17 NOTE — Telephone Encounter (Signed)
Pt called and c/o his blood sugar being elevated to 586.  Pt had called EMS and they checked him out and they checked his sugar and was 593.  Pt is on prednisone and we explained that he needs to drink plenty of water and take 30 units of the TOUJEO daily.  Spoke to [employer] and she added Glimepiride 2 mg to take now while on phone with pt (220 pm) and to still take the regular dose at supper but to take and eat at 6 pm this evening.  We advised pt to watch his diet while on prednisone and I was mailing him a diabetic meal planning guide to help him with his meals.  Alyssa also added for pt to start taking the Glimepiride 2 mg once in the morning and also in the evening at supper.  We told pt to keep check on his BS and if they seem to not come down and kept elevating to call us back and or he may need to go to ER.  ?

## 2021-08-18 ENCOUNTER — Telehealth: Payer: Self-pay

## 2021-08-18 NOTE — Telephone Encounter (Signed)
Pt called back to give information on his blood sugar readings.  Pt advised he checked his BS last night at 8 pm and was 580, checked at 7 am and BS was 177.  Pt ate a banana and a boiled egg and waited til 8:45 am and checked BS and was 384.  I had pt check his BS again while on phone and pt advised that he has had nothing else to eat and has only been drinking water pt's BS was 379.  I asked pt to have the home health nurse to call us at [facility] when she arrives today so we can have her check his BS machine to see what readings are.  We can also ask [facility] nurse if she has a bs machine that she can check pt's bs and see if his machine is registering correctly  ?

## 2021-08-18 NOTE — Progress Notes (Signed)
Patient: Larry Callahan   DOB:  [birth year]  MRN: [id number]  Larry Callahan is a 66 yo male with a PMHx of nonischemic cardiomyopathy last EF measured in April 2022 was 76 to 40%, COPD, sleep apnea, chronic kidney disease stage IV, chronic anemia, hypertension, and diabetes mellitus.  Admitted 2/23-2/26/23 with worsening SOB, and orthopnea, diuresed 4.9 liters and ultimately d/c'd home with [facility] RN services.   Echo from 08/08/21 reviewed and showed and EF of 40-45%. There is mild to moderately decreased function of the left ventricle. The diastolic parameters are indeterminate. The RV systolic function is low normal.   He presents today to the heart failure clinic for his initial visit with a chief complaint of SOB with exertion. He states this is chronic in nature and has been present for some time. He has associated fatigue, pedal edema and easy bruising along with this. He denies any CP, palpitations, cough, dizziness, orthopnea, or worsening pedal edema.  He was weighing himself at home, but needs new batteries and has not picked them up yet.   Past Medical History:  Diagnosis Date   Allergy    Chronic combined systolic (congestive) and diastolic (congestive) heart failure ([facility])    a. 04/2019 Echo: EF 45-50%; b. 02/2019 Echo: EF 55-60%; c. 09/2020 Echo: EF 35-40%, glob HK. Nl RV size/fxn. Mild MR.   CKD (chronic kidney disease), stage III (HCC)    COPD (chronic obstructive pulmonary disease) ([place])    Coronary artery disease    a. 2011 Cath: nonobs dzs; b. 09/2020 MV: EF 38%. No signif ischemia. ? inf MI vs diaph attenuation. GI uptake artifact.   Diabetes mellitus without complication ([place])    GERD (gastroesophageal reflux disease)    Hyperlipidemia    Hypertension    Morbid obesity ([place])    NICM (nonischemic cardiomyopathy) ([place])    a. 04/2019 Echo: EF 45-50%; b. 02/2019 Echo: EF 55-60%; c. 09/2020 Echo: EF 35-40%, glob HK; d. 09/2020 MV: No ischemia. ? inf infarct vs attenuation.   OSA  (obstructive sleep apnea)    Past Surgical History:  Procedure Laterality Date   BACK SURGERY     CARDIAC CATHETERIZATION     CHOLECYSTECTOMY     COLONOSCOPY WITH PROPOFOL N/A 04/12/2019   Procedure: COLONOSCOPY WITH PROPOFOL;  Surgeon: [name], MD;  Location: [facility] ENDOSCOPY;  Service: Gastroenterology;  Laterality: N/A;   ESOPHAGOGASTRODUODENOSCOPY N/A 04/12/2019   Procedure: ESOPHAGOGASTRODUODENOSCOPY (EGD);  Surgeon: [name], MD;  Location: [facility] ENDOSCOPY;  Service: Gastroenterology;  Laterality: N/A;   ESOPHAGOGASTRODUODENOSCOPY (EGD) WITH PROPOFOL N/A 11/21/2019   Procedure: ESOPHAGOGASTRODUODENOSCOPY (EGD) WITH PROPOFOL;  Surgeon: [name], MD;  Location: [facility] ENDOSCOPY;  Service: Gastroenterology;  Laterality: N/A;   FLEXIBLE BRONCHOSCOPY Bilateral 05/17/2019   Procedure: FLEXIBLE BRONCHOSCOPY;  Surgeon: [name], MD;  Location: [name] ORS;  Service: Pulmonary;  Laterality: Bilateral;   left arm surgery     nephrectomy Left    PARATHYROIDECTOMY     RIGHT HEART CATH N/A 03/29/2019   Procedure: RIGHT HEART CATH;  Surgeon: [name], MD;  Location: [facility];  Service: Cardiovascular;  Laterality: N/A;   Family History  Problem Relation Age of Onset   Diabetes Mother        2003   Lung cancer Father    Diabetes Sister    Hypertension Sister    Heart disease Sister    Cancer Sister    Bone cancer Brother    Social History  Tobacco Use   Smoking status: Former    Types: Cigarettes   Smokeless tobacco: Former    Types: Chew  Substance Use Topics   Alcohol use: No   Allergies  Allergen Reactions   Penicillins Rash   Prior to Admission medications   Medication Sig Start Date End Date Taking? Authorizing Provider  amLODipine (NORVASC) 10 MG tablet Take 1 tablet (10 mg total) by mouth daily. 08/11/21   [name], NP  apixaban (ELIQUIS) 2.5 MG TABS tablet Take 1 tablet (2.5 mg total) by mouth 2 (two) times  daily. 05/20/21   [name], NP  Blood Glucose Calibration (TRUE METRIX LEVEL 1) Low SOLN Use as directed 04/04/18   [name], NP  Blood Glucose Monitoring Suppl (TRUE METRIX AIR GLUCOSE METER) DEVI 1 Device by Does not apply route 2 (two) times daily. 04/13/18   [name], NP  Budeson-Glycopyrrol-Formoterol (BREZTRI AEROSPHERE) 160-9-4.8 MCG/ACT AERO Inhale 2 puffs into the lungs in the morning and at bedtime. 07/21/21   [name], NP  budesonide (PULMICORT) 0.25 MG/2ML nebulizer solution Take 2 mLs (0.25 mg total) by nebulization daily for 7 days. 08/08/21 08/15/21  Swayze, Ava, DO  carvedilol (COREG) 12.5 MG tablet Take 1 tablet (12.5 mg total) by mouth 2 (two) times daily. 01/07/21 01/07/22  Arta Silence, MD  cetirizine (ZYRTEC) 10 MG tablet Take 10 mg by mouth daily.    [provider]  chlorthalidone (HYGROTON) 25 MG tablet Take 1 tablet (25 mg total) by mouth daily. 08/11/21   [name], NP  furosemide (LASIX) 40 MG tablet Take 1 tablet (40 mg total) by mouth 2 (two) times daily. 08/08/21   Swayze, Ava, DO  gabapentin (NEURONTIN) 100 MG capsule Take 1 capsule (100 mg total) by mouth in the morning, at noon, and at bedtime. 08/11/21   [name], NP  glimepiride (AMARYL) 2 MG tablet Take one tab po qd with supper 02/23/21   [name], NP  glucose blood (TRUE METRIX BLOOD GLUCOSE TEST) test strip Use as instructed twice daily diag E11.65 07/30/19   [name], NP  hydrALAZINE (APRESOLINE) 100 MG tablet Take 1 tablet (100 mg total) by mouth 3 (three) times daily. 08/11/21   Abernathy, Yetta Flock, NP  insulin glargine, 1 Unit Dial, (TOUJEO) 300 UNIT/ML Solostar Pen Inject 20 Units into the skin daily. Reduced from prior 26 units daily. 08/08/21   Swayze, Ava, DO  ipratropium-albuterol (DUONEB) 0.5-2.5 (3) MG/3ML SOLN Take 3 mLs by nebulization every 6 (six) hours as needed. 06/29/21   Danford, Suann Larry, MD  NOVOFINE PEN NEEDLE 32G X 6 MM  MISC Use as directed 10/15/20   Lavera Guise, MD  omeprazole (PRILOSEC) 40 MG capsule Take 1 capsule (40 mg total) by mouth in the morning and at bedtime. 05/12/21   [name], NP  potassium chloride SA (KLOR-CON M) 20 MEQ tablet Take 1 tablet (20 mEq total) by mouth 2 (two) times daily. 07/14/21   [name], NP  predniSONE (DELTASONE) 20 MG tablet Take 3 tablets (60 mg total) by mouth daily for 5 days, THEN 2.5 tablets (50 mg total) daily for 5 days, THEN 2 tablets (40 mg total) daily for 5 days, THEN 1.5 tablets (30 mg total) daily for 5 days, THEN 1 tablet (20 mg total) daily for 5 days, THEN 0.5 tablets (10 mg total) daily for 5 days. 08/08/21 09/07/21  Swayze, Ava, DO  rosuvastatin (CRESTOR) 10 MG tablet Take 1 tablet (10 mg total)  by mouth at bedtime. 08/11/21   [name], NP  TRUEplus Lancets 33G MISC Use as directed twice daily diag E11.65 07/30/19   [name], NP  vitamin B-12 1000 MCG tablet Take 1 tablet (1,000 mcg total) by mouth daily. 01/16/21   Fritzi Mandes, MD   Review of Systems  Constitutional:  Positive for malaise/fatigue.  HENT:  Positive for hearing loss and sore throat.   Eyes:  Negative for blurred vision and double vision.  Respiratory:  Positive for shortness of breath. Negative for cough, sputum production and wheezing.   Cardiovascular:  Positive for leg swelling. Negative for chest pain, palpitations and orthopnea.  Gastrointestinal: Negative.   Genitourinary: Negative.   Musculoskeletal:  Positive for back pain.  Skin: Negative.   Neurological:  Negative for dizziness and headaches.  Endo/Heme/Allergies:  Bruises/bleeds easily.  Psychiatric/Behavioral:  The patient is not nervous/anxious.     Physical Exam Vitals and nursing note reviewed.  Constitutional:      General: He is not in acute distress.    Appearance: He is ill-appearing. He is not toxic-appearing.  Neck:     Vascular: No carotid bruit.  Cardiovascular:     Rate and Rhythm:  Normal rate and regular rhythm.     Pulses: Normal pulses.  Pulmonary:     Effort: Pulmonary effort is normal.  Abdominal:     Palpations: Abdomen is soft.  Musculoskeletal:        General: Swelling present.     Right lower leg: Edema (+2) present.     Left lower leg: Edema (+1) present.  Skin:    General: Skin is warm and dry.     Findings: Bruising present.  Neurological:     Mental Status: He is alert and oriented to person, place, and time.    Vitals:   08/19/21 1204  BP: 138/74  Pulse: 72  Resp: 18  SpO2: 98%  Weight: 251 lb 9 oz (114.1 kg)  Height: _0  (1.803 m)   Wt Readings from Last 3 Encounters:  08/19/21 251 lb 9 oz (114.1 kg)  08/11/21 259 lb (117.5 kg)  08/08/21 257 lb 4.4 oz (116.7 kg)   Lab Results  Component Value Date   CREATININE 2.68 (H) 08/08/2021   CREATININE 2.55 (H) 08/07/2021   CREATININE 2.55 (H) 08/06/2021   Assessment and Plan:  Heart failure with reduced ejection fraction - NYHA II - euvolemic today - has scales but has not been weighing, needs new batteries; instructed to get new batteries and call for an overnight weight gain of > 2 pounds or a weekly weight gain of > 5 pounds - HH RN has been visiting him and weighs him when she comes - discussed low sodium diet, he advised that he adheres and eats fresh fruit and vegetables - discussed total fluid intake ~ 64 ounces/day; he voices concerns about restricting as he was advised to increase during periods of hyperglycemia - he would greatly benefit from the paramedicine program, he is agreeable to this, brochure provided - will require close monitoring and continued education, relatively low health literacy level - confusion surrounding his medication regimen, will not start any other medications today  - previously on lisinopril but d/c'd d/t CKD - saw cardiology Rockey Situ) 11/11/20 - on GDMT coreg, chlorthalidone  - BNP 08/05/21 72.4  2. HTN with CKD - BP today 138/74 - BMP 08/08/21 Na  134, K 3.3, Cr 2.68, gfr 25 - will check BMP today, does not appear  it has been checked since discharge - unclear if he follows with nephrology, he advised he had a kidney removed but thinks that MD retired  3. DM  - A1C 7.0% 08/06/21 - BS today 300, much lower than it has been recently - saw PCP (Abernathy) 08/11/21  4: Afib - RRR noted today  - eliquis  5. OSA - wears CPAP with O2 HS, for "all hours I sleep, including naps"   Patient did not bring his medications, attempted to verbally review but he can only discuss shape of the pills he takes. Encouraged him to bring bottles to next visit.  Return in 1 month, sooner if needed.

## 2021-08-18 NOTE — Telephone Encounter (Signed)
Alden Benjamin [phone]) Home health nurse called and spoke to me about patient and she advised that pt took his BS at 1245 pm and was 446, pt was drinking a drink and he told nurse it was sugar free.  I informed nurse that we have a new accu chek bs machine we can give patient and she advised she will come by the office and pick it up and take to patient, and I am going too send new test strip and lancets to the pharmacy.  I also gave nurse verbal approval to see patient twice a week  to help with his diabetic issues and help him understand what he can eat.  I also gave nurse verbal approval to have a social worker come in to help patient get help he needs for things to help in his care.  I informed home health nurse that Dr Humphrey Rolls stopped the prednisone on 08/17/21 due to pt's bs elevating to 593 per EMS.   ?I am mailing Alden Benjamin a copy of the diabetic meal planning guide and I also mailed the pt one to. ?

## 2021-08-19 ENCOUNTER — Other Ambulatory Visit
Admission: RE | Admit: 2021-08-19 | Discharge: 2021-08-19 | Disposition: A | Discharge status: Home / Self Care | Payer: Medicare PPO | Source: Ambulatory Visit | Attending: Family | Admitting: Family

## 2021-08-19 ENCOUNTER — Other Ambulatory Visit: Payer: Self-pay

## 2021-08-19 ENCOUNTER — Ambulatory Visit (HOSPITAL_BASED_OUTPATIENT_CLINIC_OR_DEPARTMENT_OTHER): Payer: Medicare PPO | Admitting: Family

## 2021-08-19 ENCOUNTER — Encounter: Payer: Self-pay | Admitting: Family

## 2021-08-19 VITALS — BP 138/74 | HR 72 | Resp 18 | Ht 71.0 in | Wt 251.6 lb

## 2021-08-19 DIAGNOSIS — I428 Other cardiomyopathies: Secondary | ICD-10-CM | POA: Diagnosis not present

## 2021-08-19 DIAGNOSIS — I5042 Chronic combined systolic (congestive) and diastolic (congestive) heart failure: Secondary | ICD-10-CM | POA: Insufficient documentation

## 2021-08-19 DIAGNOSIS — Z7901 Long term (current) use of anticoagulants: Secondary | ICD-10-CM | POA: Insufficient documentation

## 2021-08-19 DIAGNOSIS — I4891 Unspecified atrial fibrillation: Secondary | ICD-10-CM | POA: Insufficient documentation

## 2021-08-19 DIAGNOSIS — E1122 Type 2 diabetes mellitus with diabetic chronic kidney disease: Secondary | ICD-10-CM | POA: Insufficient documentation

## 2021-08-19 DIAGNOSIS — J449 Chronic obstructive pulmonary disease, unspecified: Secondary | ICD-10-CM | POA: Insufficient documentation

## 2021-08-19 DIAGNOSIS — R0602 Shortness of breath: Secondary | ICD-10-CM | POA: Insufficient documentation

## 2021-08-19 DIAGNOSIS — I10 Essential (primary) hypertension: Secondary | ICD-10-CM | POA: Diagnosis not present

## 2021-08-19 DIAGNOSIS — N183 Chronic kidney disease, stage 3 unspecified: Secondary | ICD-10-CM | POA: Insufficient documentation

## 2021-08-19 DIAGNOSIS — Z794 Long term (current) use of insulin: Secondary | ICD-10-CM

## 2021-08-19 DIAGNOSIS — E1165 Type 2 diabetes mellitus with hyperglycemia: Secondary | ICD-10-CM

## 2021-08-19 DIAGNOSIS — I480 Paroxysmal atrial fibrillation: Secondary | ICD-10-CM | POA: Diagnosis not present

## 2021-08-19 DIAGNOSIS — Z9989 Dependence on other enabling machines and devices: Secondary | ICD-10-CM | POA: Insufficient documentation

## 2021-08-19 DIAGNOSIS — G4733 Obstructive sleep apnea (adult) (pediatric): Secondary | ICD-10-CM | POA: Diagnosis not present

## 2021-08-19 DIAGNOSIS — I130 Hypertensive heart and chronic kidney disease with heart failure and stage 1 through stage 4 chronic kidney disease, or unspecified chronic kidney disease: Secondary | ICD-10-CM | POA: Insufficient documentation

## 2021-08-19 LAB — BASIC METABOLIC PANEL
Anion gap: 12 (ref 5–15)
BUN: 94 mg/dL — ABNORMAL HIGH (ref 8–23)
CO2: 27 mmol/L (ref 22–32)
Calcium: 8.6 mg/dL — ABNORMAL LOW (ref 8.9–10.3)
Chloride: 92 mmol/L — ABNORMAL LOW (ref 98–111)
Creatinine, Ser: 2.67 mg/dL — ABNORMAL HIGH (ref 0.61–1.24)
GFR, Estimated: 25 mL/min — ABNORMAL LOW (ref >60)
Glucose, Bld: 256 mg/dL — ABNORMAL HIGH (ref 70–99)
Potassium: 3.8 mmol/L (ref 3.5–5.1)
Sodium: 131 mmol/L — ABNORMAL LOW (ref 135–145)

## 2021-08-19 NOTE — Patient Instructions (Addendum)
If you have voicemail, please make sure your mailbox is cleaned out so that we may leave a message and please make sure to listen to any voicemails.   ? ?Get you some batteries to place in your scale and start weighing. ? ?Start weighing daily. ? ?STAY AROUND 64 ounces/day of fluid, that is (8) eight ounce glassed/day of TOTAL FLUID. ? ?Watch your salt intake. ? ?Look for Marita Kansas, the paramedic, to call you.  ? ? ?

## 2021-08-20 ENCOUNTER — Other Ambulatory Visit: Payer: Self-pay

## 2021-08-20 MED ORDER — ACCU-CHEK GUIDE VI STRP: ORAL_STRIP | 3 refills | Status: DC

## 2021-08-20 MED ORDER — ACCU-CHEK SOFTCLIX LANCETS MISC: 3 refills | Status: AC

## 2021-08-23 ENCOUNTER — Telehealth (HOSPITAL_COMMUNITY): Payer: Self-pay

## 2021-08-23 NOTE — Telephone Encounter (Signed)
Attempted to contact, no answer and unable to leave a message.  Will continue to try to visit for heart failure.  ? ?Kalana Yust ?[place] EMT-Paramedic ?[phone] ?

## 2021-08-25 ENCOUNTER — Telehealth: Payer: Self-pay

## 2021-08-25 ENCOUNTER — Other Ambulatory Visit: Payer: Self-pay | Admitting: Nurse Practitioner

## 2021-08-25 DIAGNOSIS — Z794 Long term (current) use of insulin: Secondary | ICD-10-CM

## 2021-08-25 DIAGNOSIS — K219 Gastro-esophageal reflux disease without esophagitis: Secondary | ICD-10-CM

## 2021-08-25 DIAGNOSIS — E1151 Type 2 diabetes mellitus with diabetic peripheral angiopathy without gangrene: Secondary | ICD-10-CM

## 2021-08-25 NOTE — Telephone Encounter (Signed)
Pt's home nurse Alden Benjamin [phone]) called on 08/24/21 and is requesting a referral to podiatry and also to endocrine.  She  advised that the patients toenails need trimming very bad. She advised one of his toenails are black on left foot 2 nd toe. ?  ?Also Alden Benjamin feels that pt may benefit better if seeing endocrine for his diabetes due to his fluctuating blood sugars.   ?Pt has checked his bs over weekend: ?Friday fasting: 133       ?Sat fasting: 199      lunch: 341 (checks before eating)       dinner: 249      bed:218 ?Sun fasting: 156     lunch: 411        dinner: 362 ?Monday: 231 ?Alden Benjamin advised she had a discussion with pt about being consistant with how he eats his meals and told him to like eat his meals at breakfast 7 am.......lunch 12 pm.......dinner 5 pm  pt usually goes to bed at 7 pm. ?Alden Benjamin also informed me that the lady Darylene Price told pt that they have a program that sends someone out to check on pt.  She said I can call and see if that is something they do and maybe see if they check patients blood sugars also.   ?

## 2021-08-27 ENCOUNTER — Telehealth: Payer: Self-pay

## 2021-08-27 NOTE — Telephone Encounter (Signed)
Hawley from [facility] called and advised that pt's Bs this morning 7 am fasting was 116 and for breakfast he ate grits and banana.  Checked bs at 10 am was 290.  Alden Benjamin brought pt diabetic food ideas for breakfast, lunch and dinner and she also made him a grocery list that he can check off what he needs from the grocery store.  The materials she brought to patient she had it in pictures also so he can understand better.  Alden Benjamin also mad a chart for pt to document his BS readings with 4 columns7 am fasting, with Breakfast 10 am, lunch 3 pm, and 5 pme supper and bedtime and has it to take bs 2 hours prior to meals and bedtime. ? ?Also I gave a verbal order for verbal recertification for 60 days for nursing.  Pt will not do PT but I advised him he needs to try as it can help him. ?

## 2021-08-30 ENCOUNTER — Telehealth: Payer: Self-pay

## 2021-08-30 ENCOUNTER — Telehealth: Payer: Self-pay | Admitting: Family

## 2021-08-30 NOTE — Telephone Encounter (Signed)
Received phone call from [facility] nurse Haywood Lasso) regarding patient's weight. She says that he weighed 247 pounds 3 days ago and this morning he weighed 261.5 although she questions the accuracy of his weight a few days ago. No change in his symptoms and no swelling in his legs/abdomen.  ? ?In reviewing his meds, she says that the patient told her that "someone" had told him to stop taking the PM dose of furosemide. Explained that when we saw him last, he had not brought his medications and could not confirm anything that he was taking so we didn't adjust anything.  ? ?Advised her to have patient take an additional furosemide today. Also advised her to let patient know that the paramedic had tried to reach him last week.  ? ?Also asked the [facility] nurse to fax [name] an updated medication list.  ?

## 2021-08-30 NOTE — Telephone Encounter (Signed)
Blood sugar readings (7 am, 10 am, 3 pm, 5 pm) ? ?08/27/21 ?7am:     116 ?10am:   290 ?3pm:       290 ?5pm:       350 ? ?08/28/21 ?7am:     169 ?10am:    211 ?3pm:      242 ?5pm:      414 ? ?08/29/21 ?7am:     168 ?10am:   172 ?3pm:     209 ?5pm:     357 ? ?08/30/21 ?7am:     96 ? ? ? ? ? ?

## 2021-08-30 NOTE — Telephone Encounter (Signed)
Larry Callahan [phone] from [facility] called and advised that pt had a fasting BS this morning of 96.  Pt also completed hi PT and they said he done well.  Larry Callahan informed me that pt had a weight increase since Friday (247 lbs) and today was 261.2 lbs which is up 11 lbs.  On 08/02/21 he was 261.5 lbs.  I spoke to [facility] and she advised that patient needs to get in touch with Darylene Price NP with Cardiology and find out if ppt needs to increase his dose of Lasix 40 mg.  Pt is only taking 1 tablet of lasix 40 mg a day.  I advised Larry Benjamin RN to call the heart clinic and discuss what patient should do. ?

## 2021-09-03 ENCOUNTER — Telehealth: Payer: Self-pay

## 2021-09-03 NOTE — Telephone Encounter (Signed)
Per [facility], patient did return calls to schedule appointment. They have closed referral-[name] ?

## 2021-09-05 IMAGING — CT CT ANGIO CHEST
2 of 6 series · 18 of 46 positions shown · IV contrast (APPLIED)
Comparison: 11/19/2019

CLINICAL DATA: Shortness of breath, chest pain

EXAM:
CT ANGIOGRAPHY CHEST WITH CONTRAST
TECHNIQUE: Multidetector CT imaging of the chest was performed using the
standard protocol during bolus administration of intravenous
contrast. Multiplanar CT image reconstructions and MIPs were
obtained to evaluate the vascular anatomy.
CONTRAST:  60mL OMNIPAQUE IOHEXOL 350 MG/ML SOLN

[Series 6: thins · axial · 0.87mm/px · z∈[-587,-369]mm · 15 of 240 slices shown]
[im 11/240  lung]
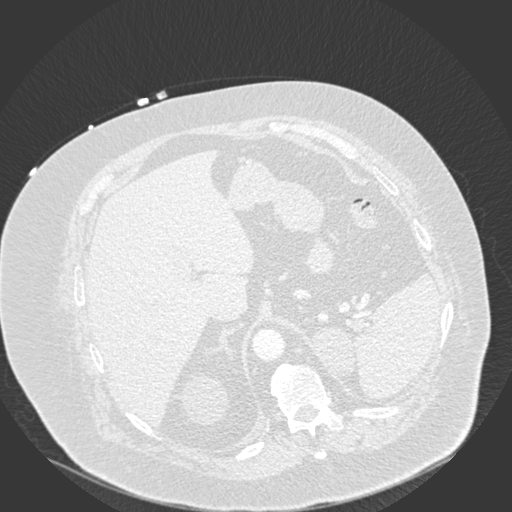
[im 32/240  soft-tissue]
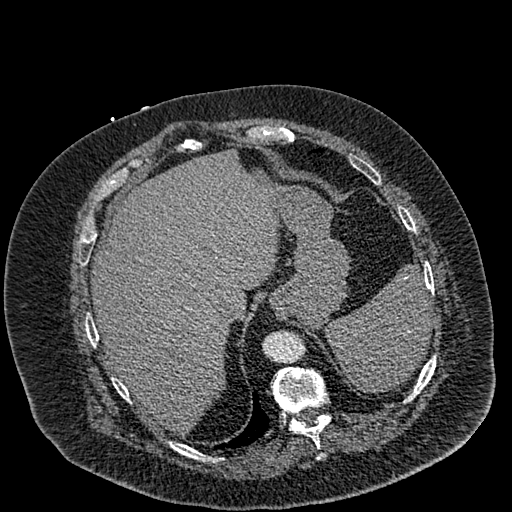
[im 42/240  lung]
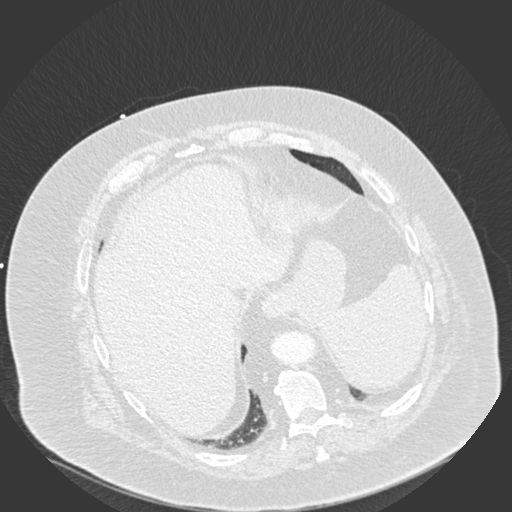
[im 63/240  soft-tissue]
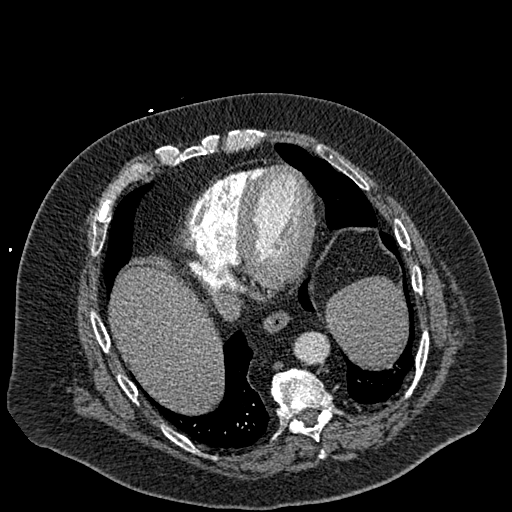
[im 73/240  lung]
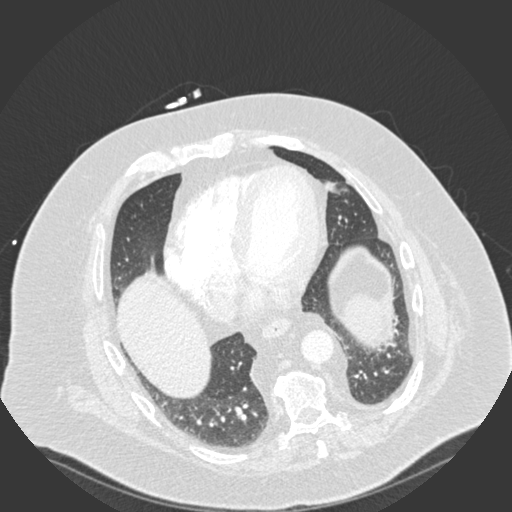
[im 94/240  soft-tissue]
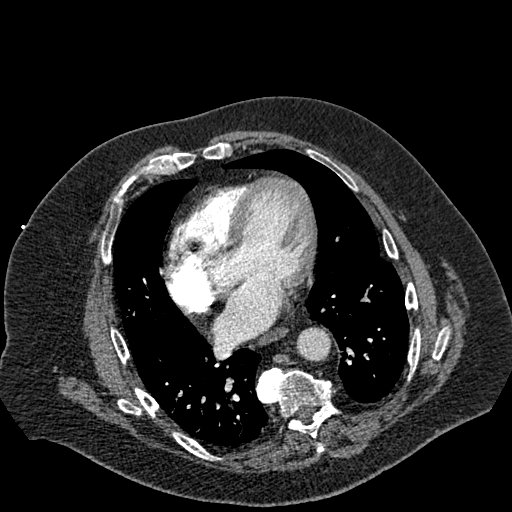
[im 104/240  lung]
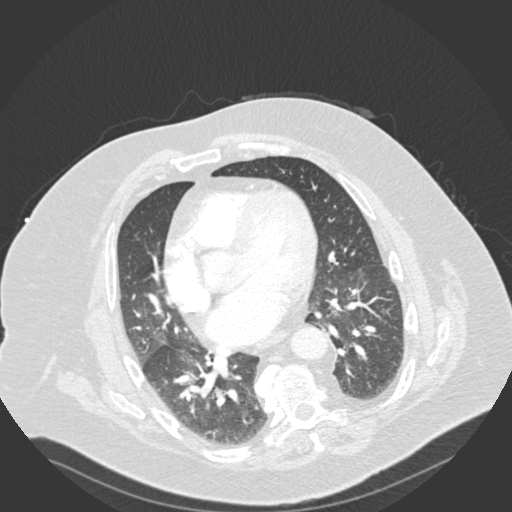
[im 125/240  soft-tissue]
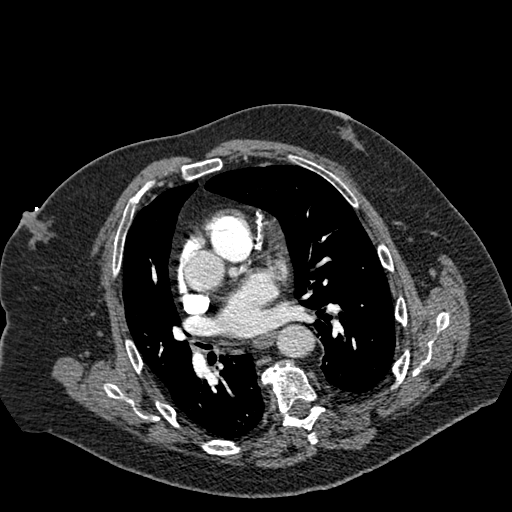
[im 136/240  lung]
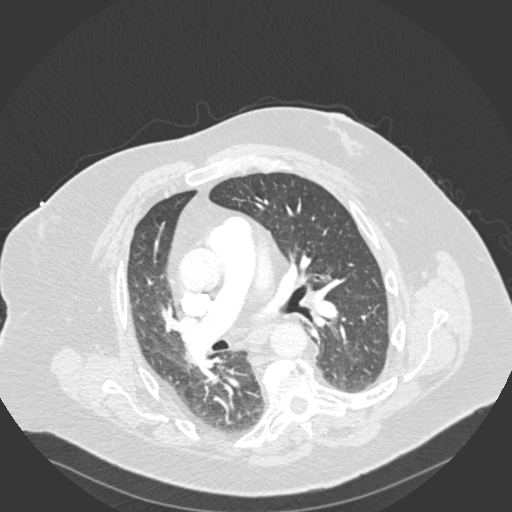
[im 146/240  soft-tissue]
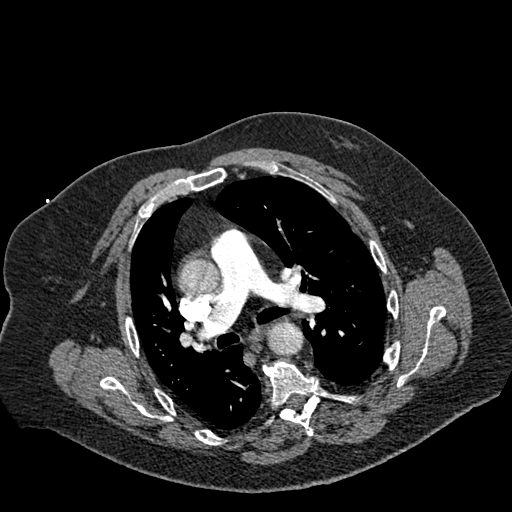
[im 167/240  lung]
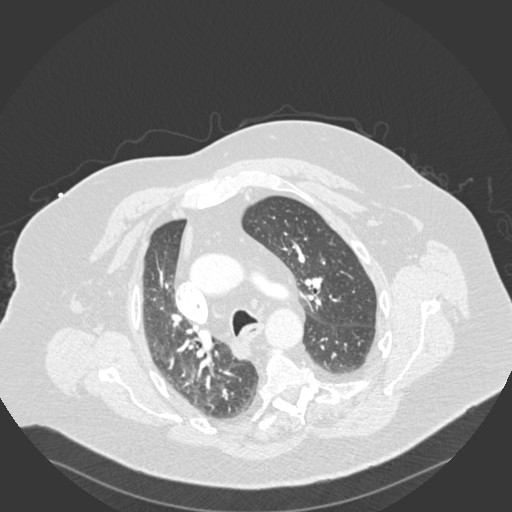
[im 177/240  soft-tissue]
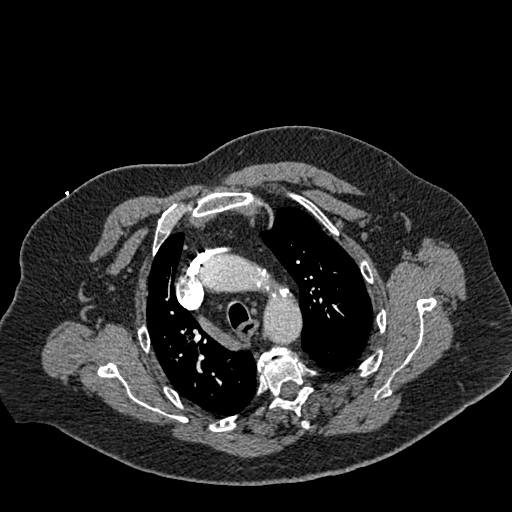
[im 198/240  lung]
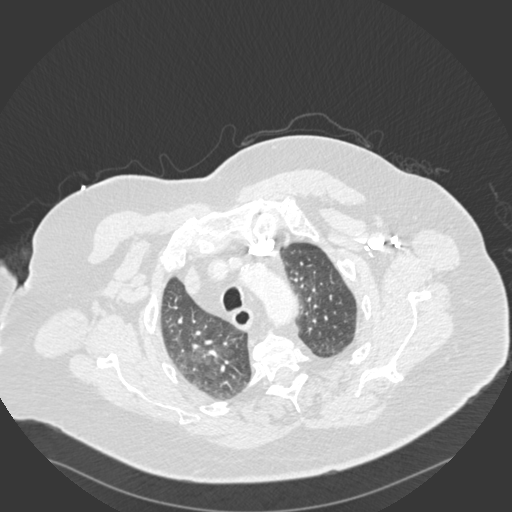
[im 208/240  soft-tissue]
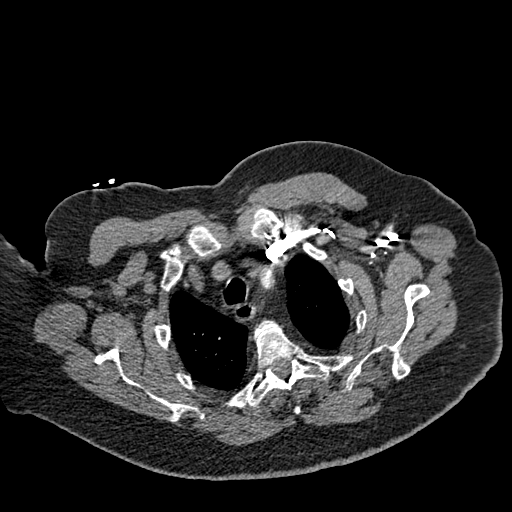
[im 229/240  lung]
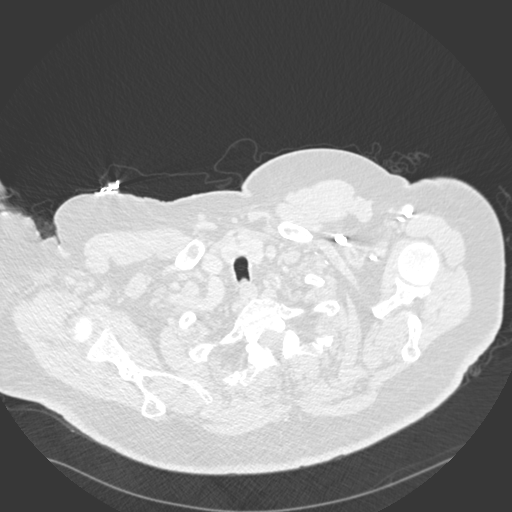

[Series 8: coronal mpr · coronal · 0.49mm/px · 3 of 108 slices shown]
[im 27/108  soft-tissue]
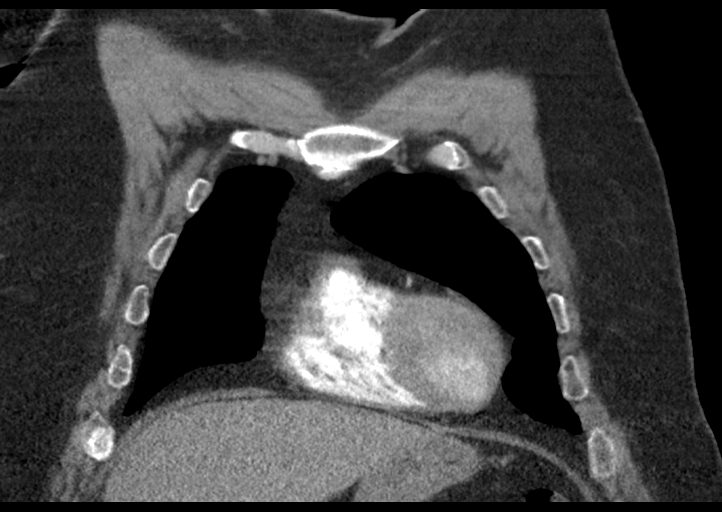
[im 54/108  soft-tissue]
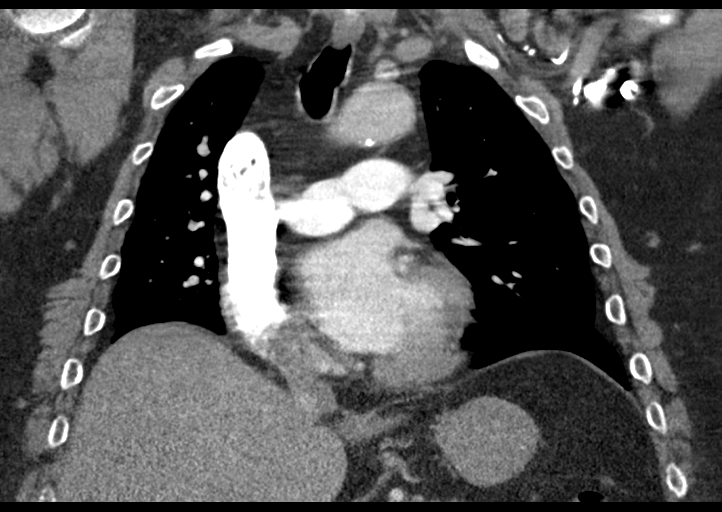
[im 81/108  soft-tissue]
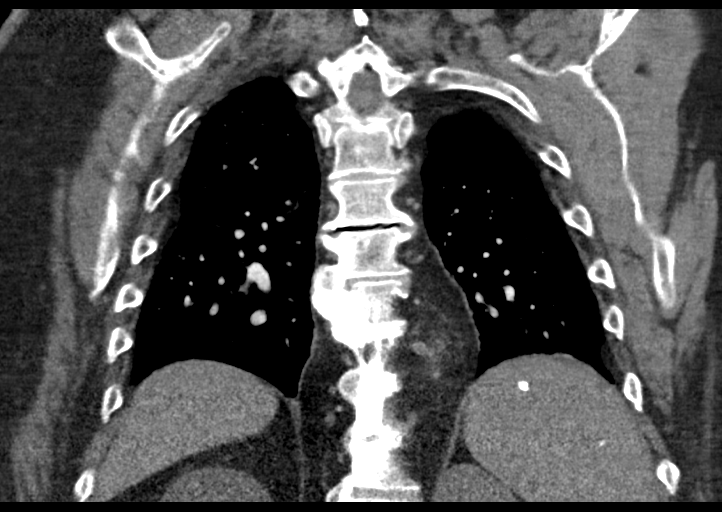

[18 of 46 positions shown; findings below may reference images not displayed]

FINDINGS: Cardiovascular: No filling defects in the pulmonary arteries to
suggest pulmonary emboli. Heart is normal size. Densely calcified
areas within the left anterior descending coronary artery. Aortic
calcifications. No aneurysm. Mild tortuosity.

Mediastinum/Nodes: No mediastinal, hilar, or axillary adenopathy.
Trachea and esophagus are unremarkable. Thyroid unremarkable.

Lungs/Pleura: No confluent opacities or effusions.

Upper Abdomen: Calcifications in the spleen compatible with old
granulomatous disease.

Musculoskeletal: Chest wall soft tissues are unremarkable. No acute
bony abnormality.

Review of the MIP images confirms the above findings.
IMPRESSION: No evidence of pulmonary embolus.

Coronary artery disease.

No acute findings.

Aortic Atherosclerosis ([id number]).

## 2021-09-05 IMAGING — CR DG CHEST 2V
2 series · 2 of 2 positions shown · non-contrast
Comparison: 01/08/2020

CLINICAL DATA: Here for chest pain and cough, burning in chest.
Reports coughing up "fluid". Unlabored at this time, low grade temp
present. Pt is chf pt

EXAM:
CHEST - 2 VIEW

[chest lat]
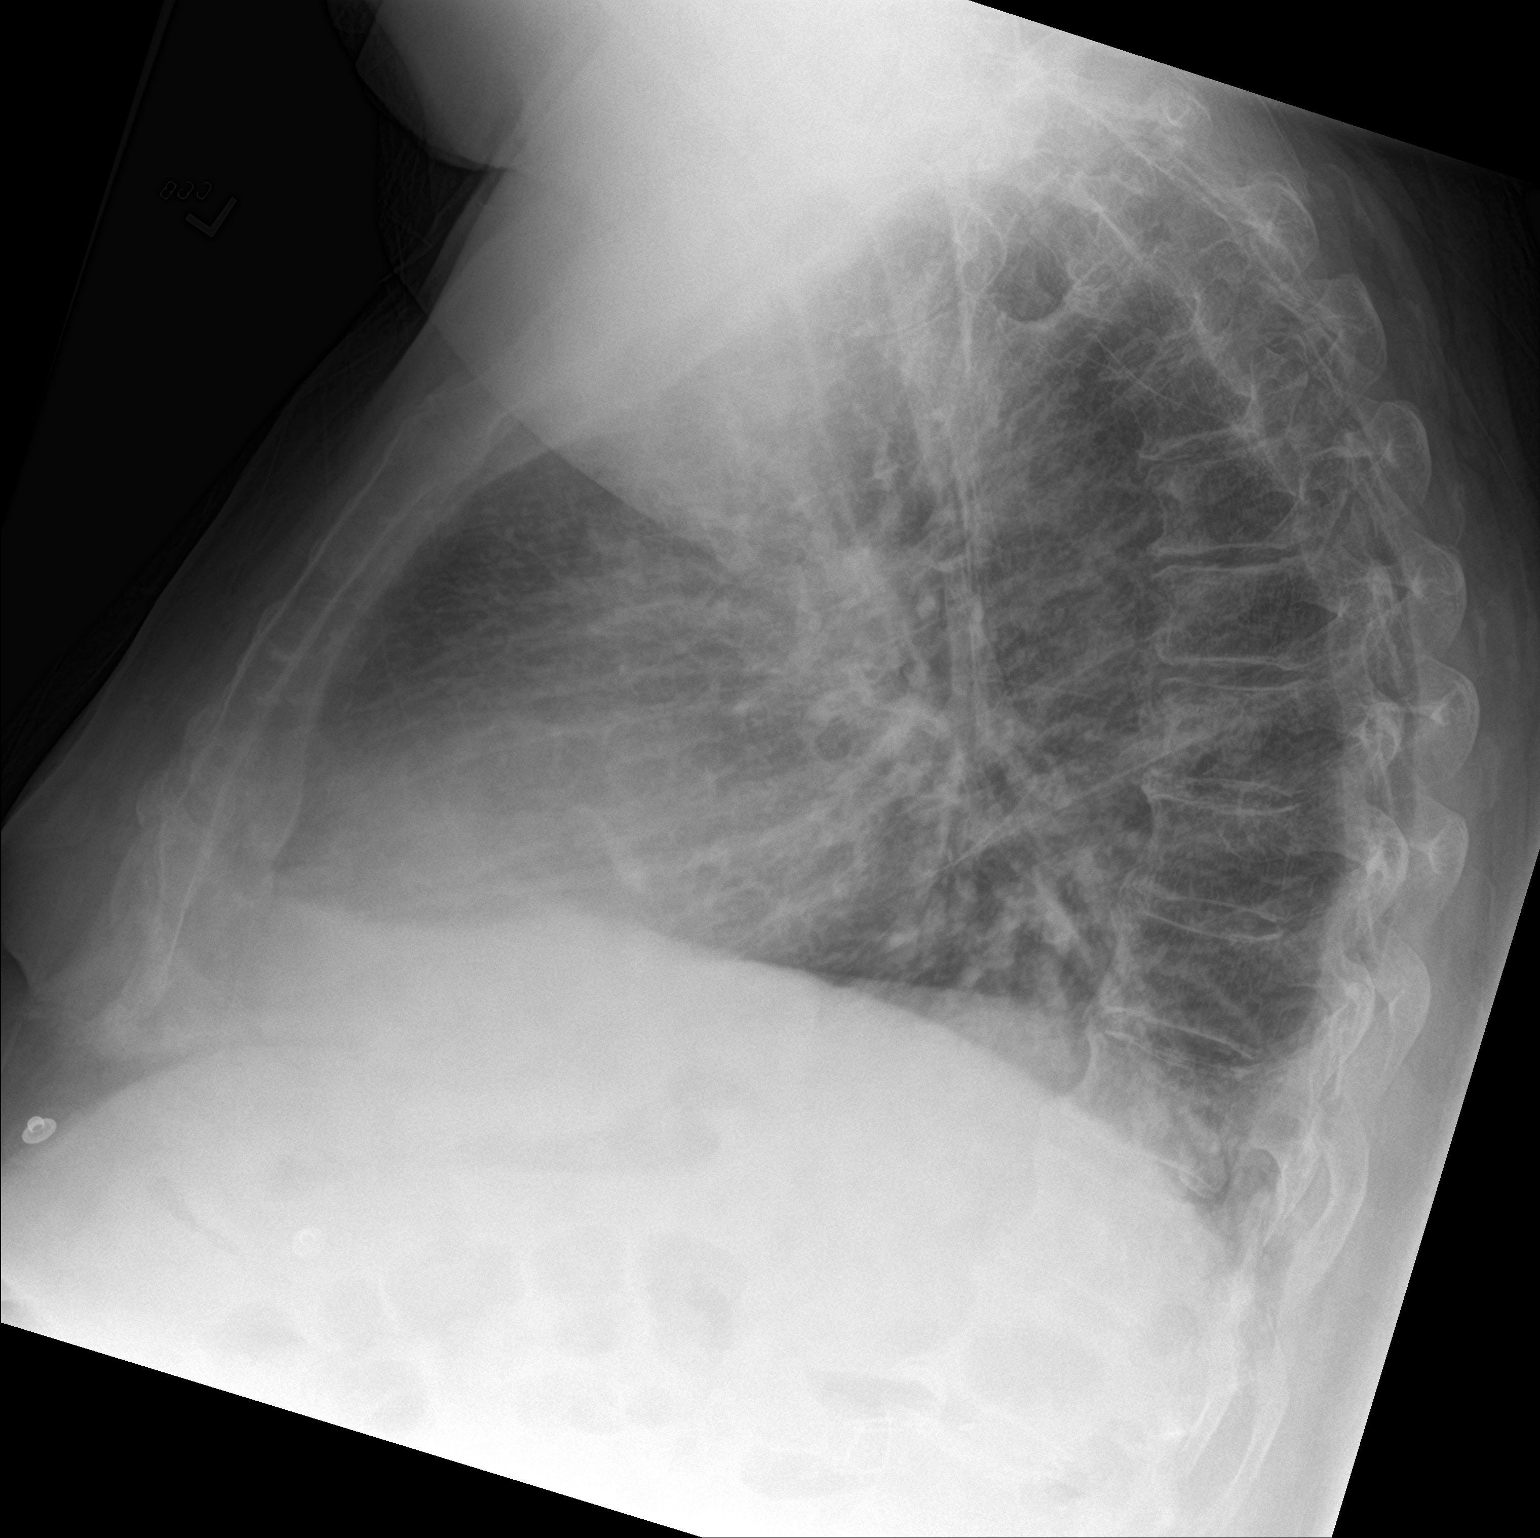

[chest ap]
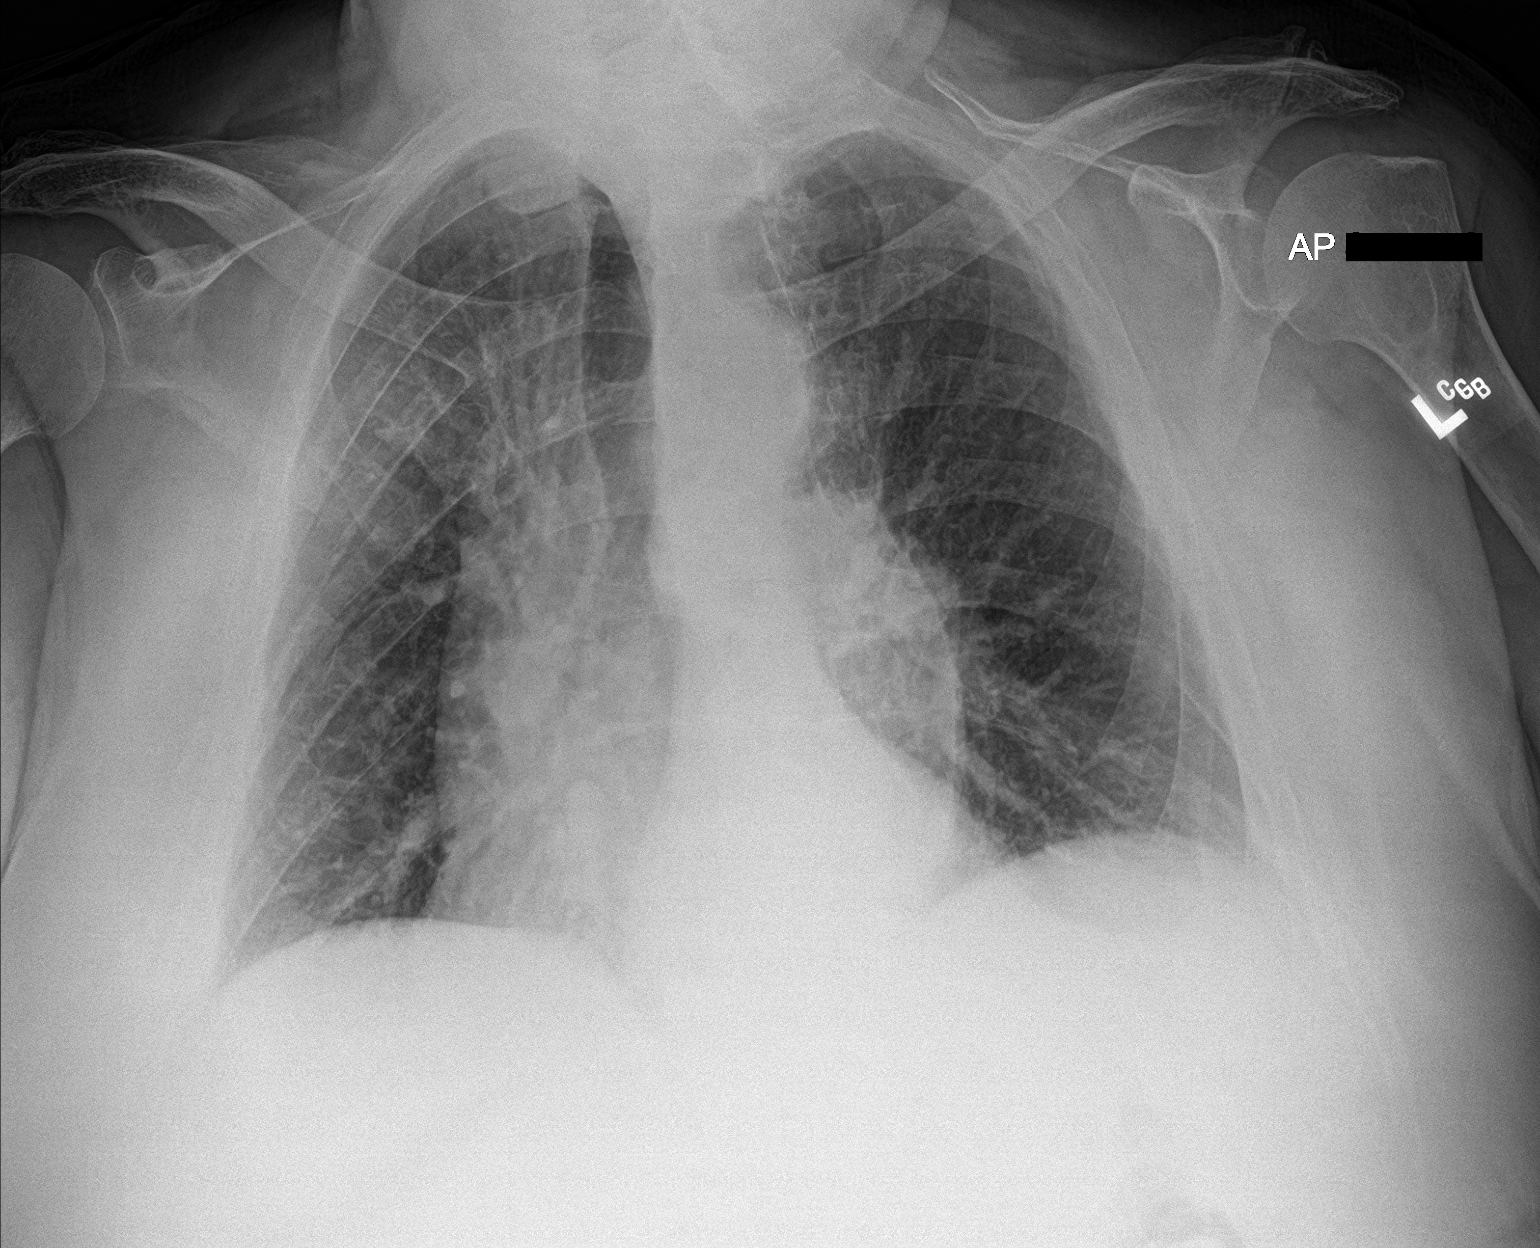

[2 of 2 positions shown; findings below may reference images not displayed]

FINDINGS: Cardiac silhouette top-normal in size. No mediastinal or hilar
masses. No evidence of adenopathy.

Prominent vascular markings similar to the prior study. Lungs
otherwise clear with no evidence of pulmonary edema.

No pleural effusion or pneumothorax.

Skeletal structures are grossly intact.
IMPRESSION: No active cardiopulmonary disease.

## 2021-09-07 IMAGING — CR DG CHEST 2V
1 series · 2 of 2 positions shown · non-contrast
Comparison: January 24, 2020

CLINICAL DATA: Weakness.

EXAM:
CHEST - 2 VIEW

[Series 1: dg chest 2 view · 0.14mm/px · 2 of 2 slices shown]
[im 1/2]
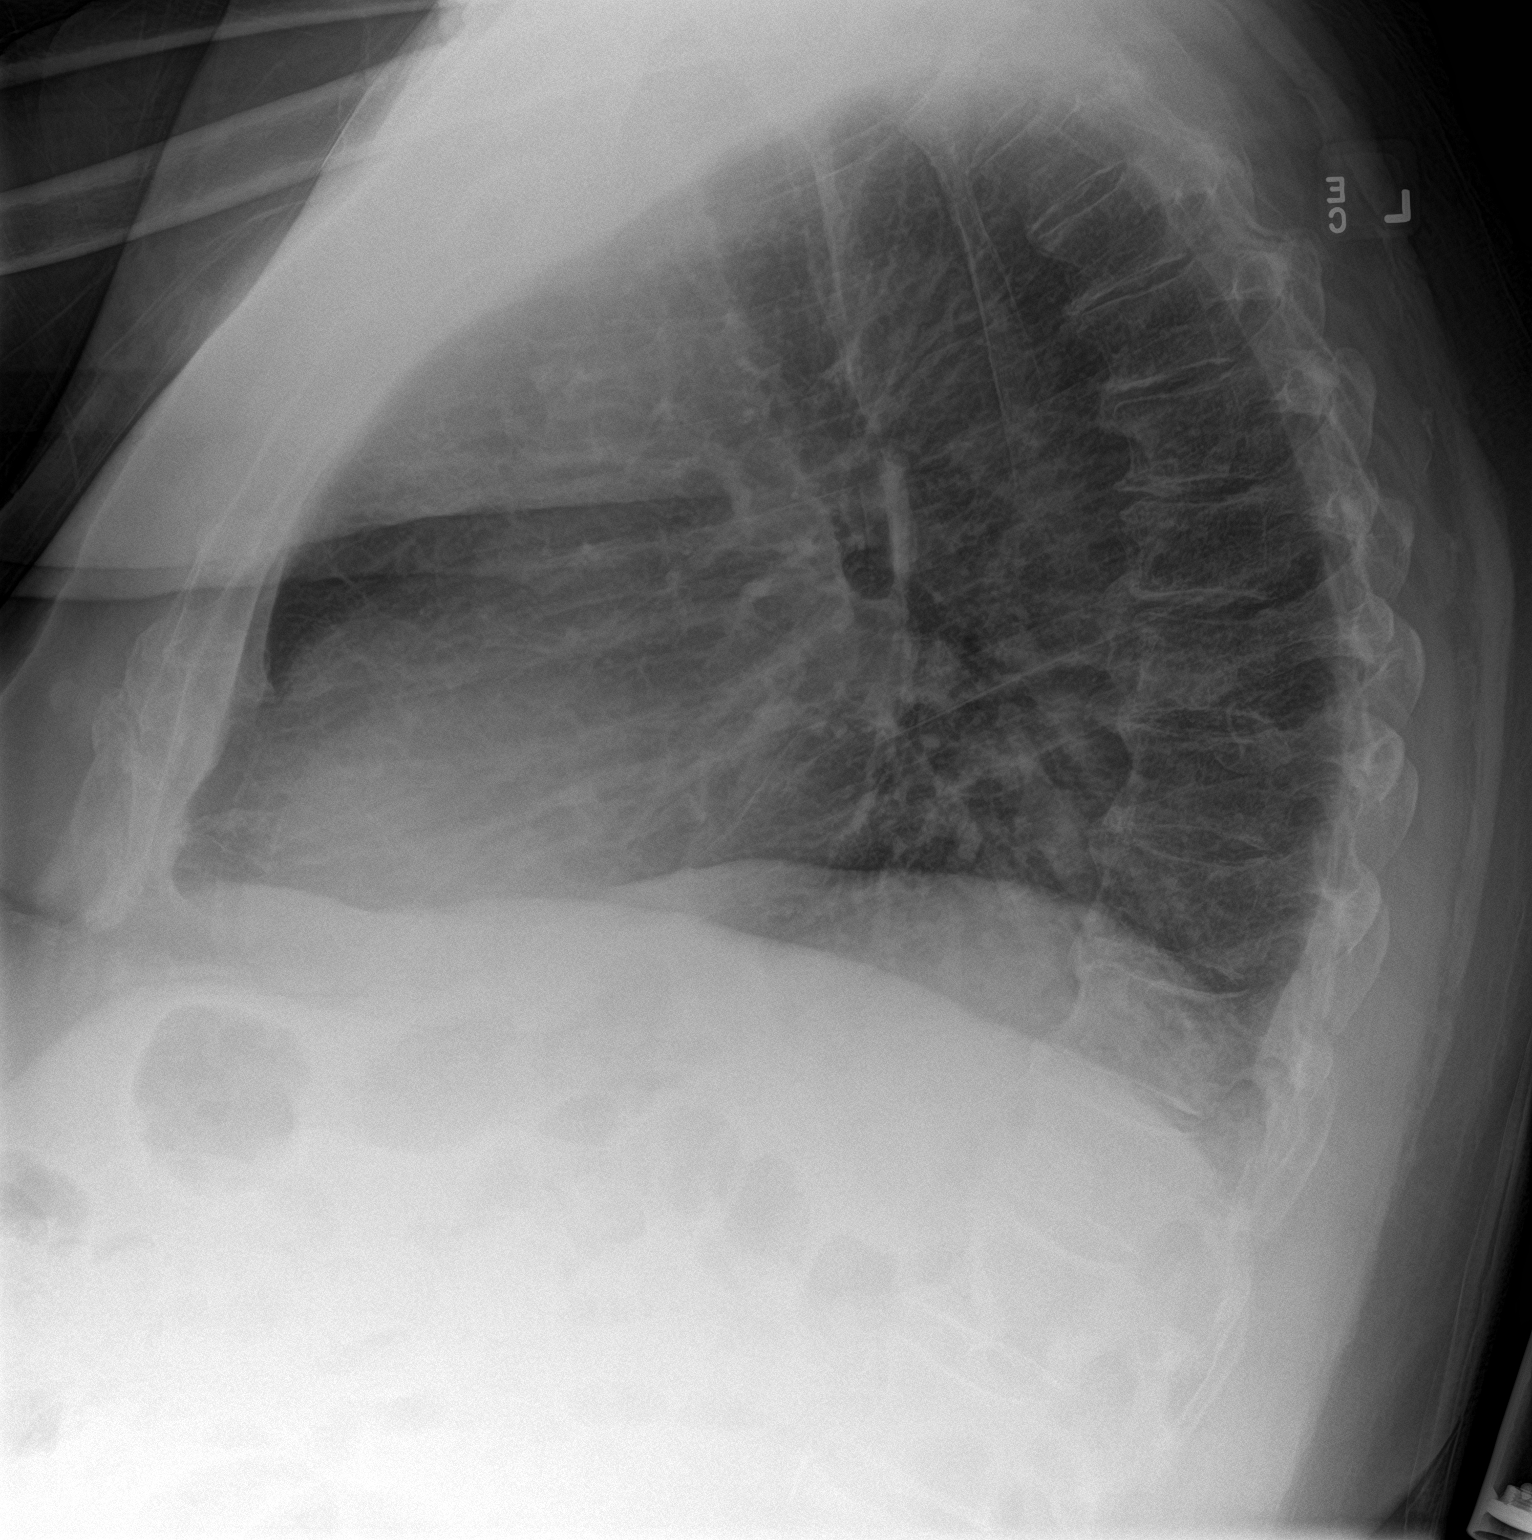
[im 2/2]
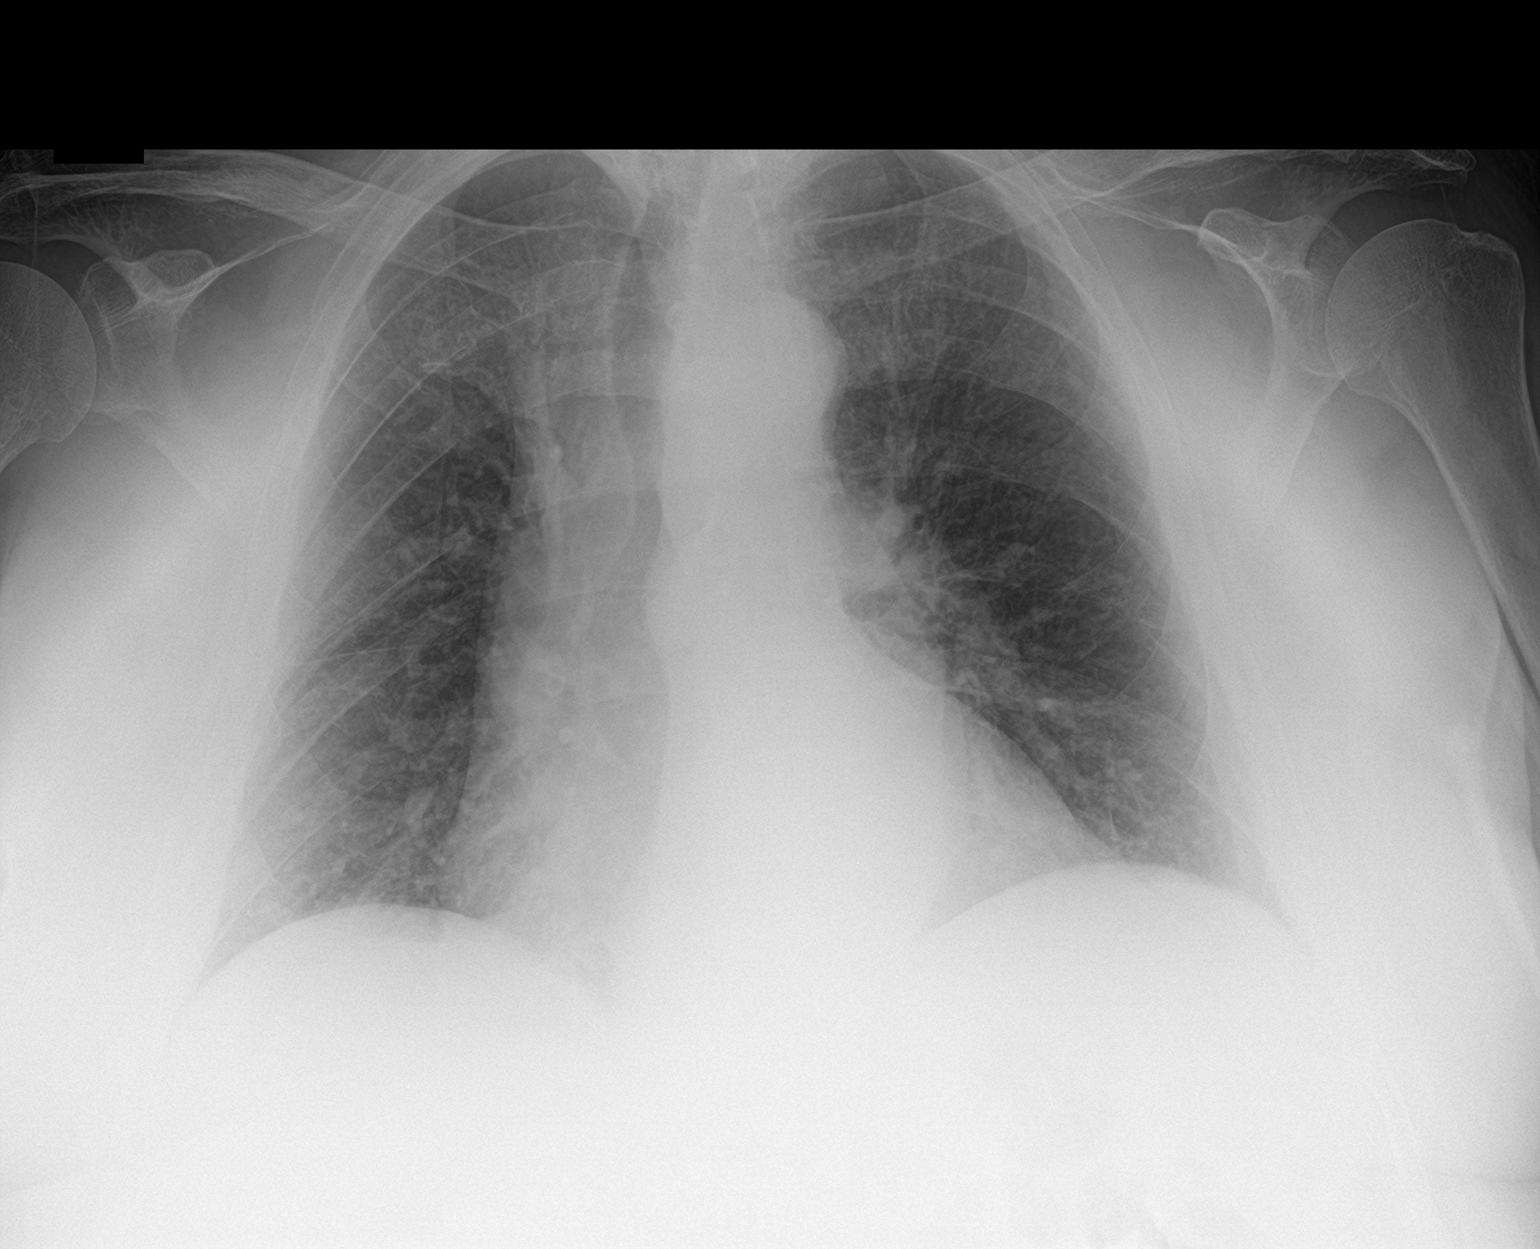

[2 of 2 positions shown; findings below may reference images not displayed]

FINDINGS: Mild, diffuse chronic appearing increased lung markings are seen.
There is no evidence of acute infiltrate, pleural effusion or
pneumothorax. The heart size and mediastinal contours are within
normal limits. The visualized skeletal structures are unremarkable.
IMPRESSION: No active cardiopulmonary disease.

## 2021-09-09 ENCOUNTER — Other Ambulatory Visit: Payer: Self-pay | Admitting: Nurse Practitioner

## 2021-09-09 ENCOUNTER — Other Ambulatory Visit: Payer: Self-pay | Admitting: Family

## 2021-09-09 DIAGNOSIS — I10 Essential (primary) hypertension: Secondary | ICD-10-CM

## 2021-09-09 DIAGNOSIS — E876 Hypokalemia: Secondary | ICD-10-CM

## 2021-09-09 DIAGNOSIS — N1832 Chronic kidney disease, stage 3b: Secondary | ICD-10-CM

## 2021-09-09 MED ORDER — HYDRALAZINE HCL 10 MG PO TABS: 10.0000 mg | ORAL_TABLET | Freq: Three times a day (TID) | ORAL | 3 refills | Status: DC

## 2021-09-09 MED ORDER — GLIMEPIRIDE 2 MG PO TABS: 2.0000 mg | ORAL_TABLET | Freq: Two times a day (BID) | ORAL | 1 refills | Status: DC

## 2021-09-09 MED ORDER — POTASSIUM CHLORIDE CRYS ER 10 MEQ PO TBCR: 20.0000 meq | EXTENDED_RELEASE_TABLET | Freq: Two times a day (BID) | ORAL | Status: DC

## 2021-09-10 ENCOUNTER — Telehealth: Payer: Self-pay

## 2021-09-10 ENCOUNTER — Other Ambulatory Visit: Payer: Self-pay | Admitting: Nurse Practitioner

## 2021-09-10 ENCOUNTER — Other Ambulatory Visit: Payer: Self-pay | Admitting: Family

## 2021-09-10 DIAGNOSIS — I10 Essential (primary) hypertension: Secondary | ICD-10-CM

## 2021-09-10 DIAGNOSIS — E876 Hypokalemia: Secondary | ICD-10-CM

## 2021-09-10 IMAGING — DX DG CHEST 1V PORT
1 series · 1 of 1 positions shown · non-contrast
Comparison: 01/26/2020

CLINICAL DATA: Cough, weakness, syncope

EXAM:
PORTABLE CHEST 1 VIEW

[chest ap]
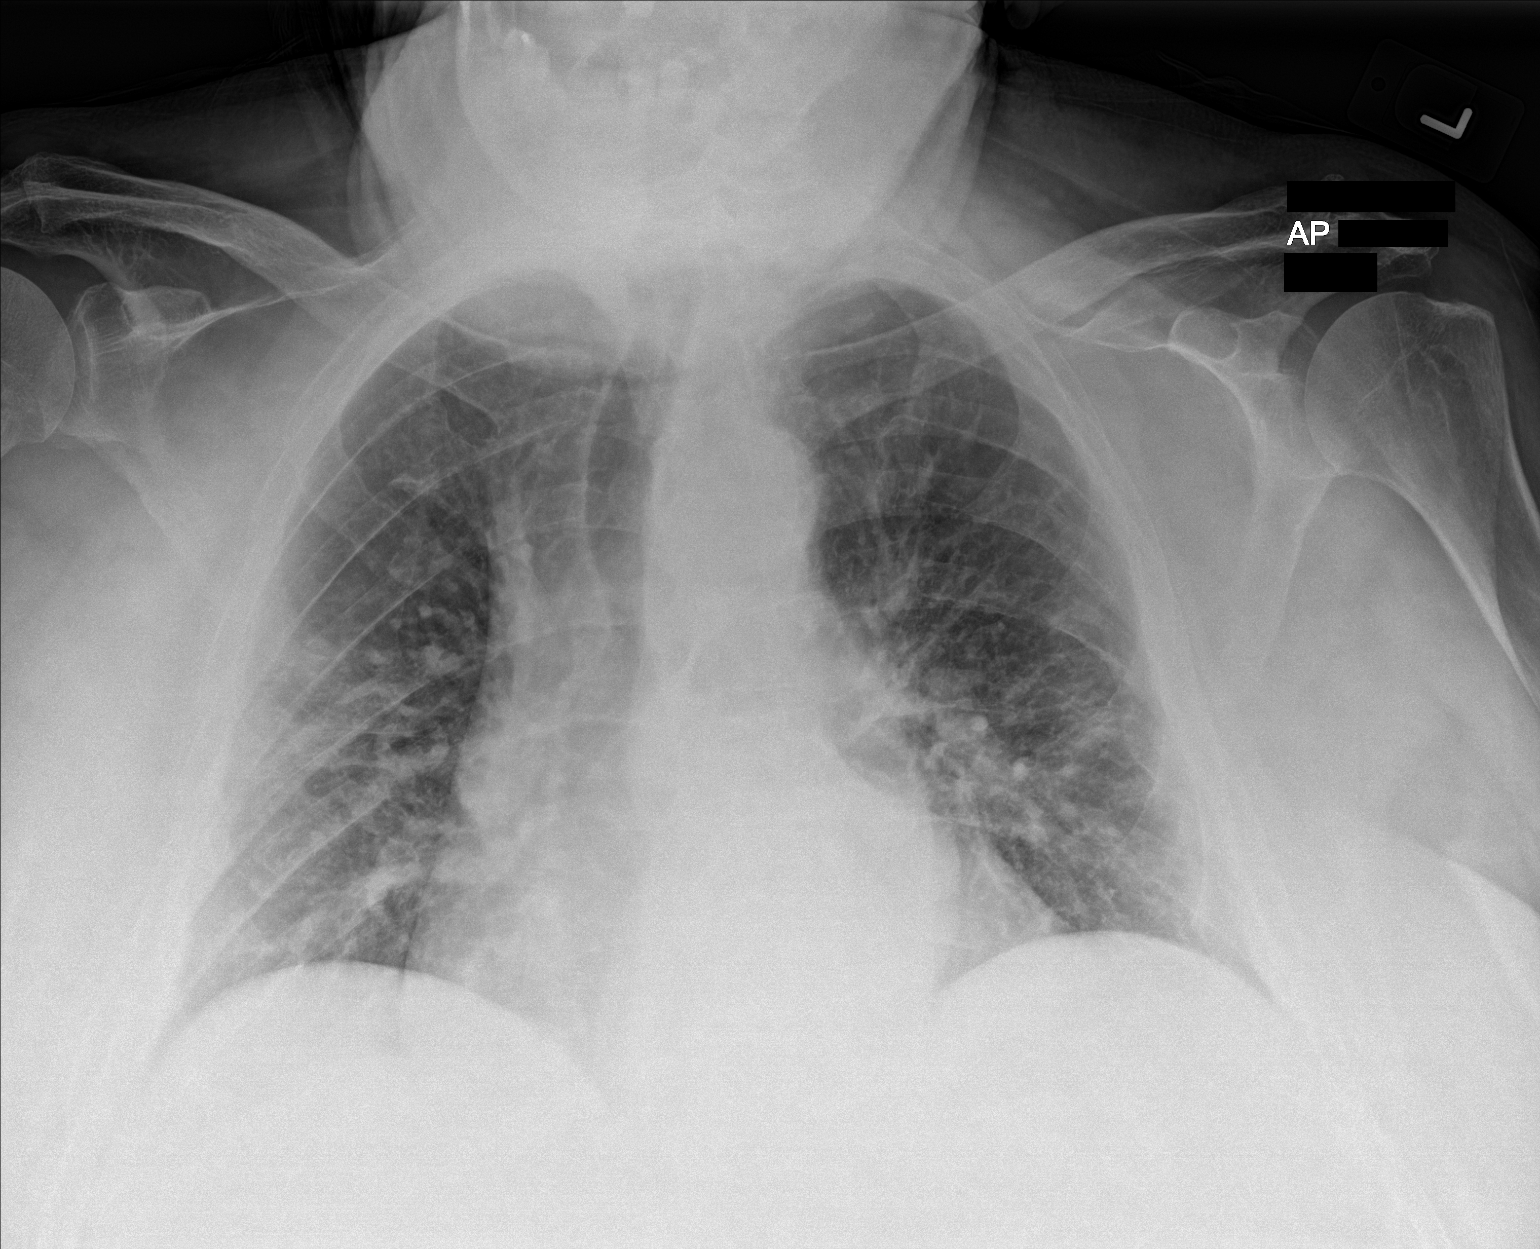

[1 of 1 positions shown; findings below may reference images not displayed]

FINDINGS: Single frontal view of the chest demonstrates a stable cardiac
silhouette. There is been interval development of bilateral patchy
perihilar airspace disease, which could reflect multifocal pneumonia
or edema. No effusion or pneumothorax. No acute bony abnormalities.
IMPRESSION: 1. Interval development of bilateral perihilar airspace disease,
consistent with multifocal pneumonia or edema.

## 2021-09-10 MED ORDER — POTASSIUM CHLORIDE CRYS ER 20 MEQ PO TBCR: 20.0000 meq | EXTENDED_RELEASE_TABLET | Freq: Two times a day (BID) | ORAL | 3 refills | Status: DC

## 2021-09-10 NOTE — Telephone Encounter (Signed)
09/06/21 Larry Callahan [phone] from [facility] called and advised that pt's blood sugar today was ?Fasting 124 ?Breakfast ate banana, corn flakes cereal ?10 am 305 ?Average is between 115 - 350 ?Near bedtime BS is around 200 -0300 ?

## 2021-09-16 ENCOUNTER — Observation Stay
Admission: EM | Admit: 2021-09-16 | Discharge: 2021-09-18 | Disposition: A | Discharge status: Home Health | Payer: Medicare PPO | Attending: Internal Medicine | Admitting: Internal Medicine

## 2021-09-16 ENCOUNTER — Other Ambulatory Visit: Payer: Self-pay

## 2021-09-16 ENCOUNTER — Emergency Department: Payer: Medicare PPO

## 2021-09-16 DIAGNOSIS — J9811 Atelectasis: Secondary | ICD-10-CM | POA: Diagnosis not present

## 2021-09-16 DIAGNOSIS — Z7901 Long term (current) use of anticoagulants: Secondary | ICD-10-CM | POA: Insufficient documentation

## 2021-09-16 DIAGNOSIS — R0789 Other chest pain: Secondary | ICD-10-CM | POA: Diagnosis not present

## 2021-09-16 DIAGNOSIS — E1165 Type 2 diabetes mellitus with hyperglycemia: Secondary | ICD-10-CM | POA: Diagnosis not present

## 2021-09-16 DIAGNOSIS — I130 Hypertensive heart and chronic kidney disease with heart failure and stage 1 through stage 4 chronic kidney disease, or unspecified chronic kidney disease: Secondary | ICD-10-CM | POA: Diagnosis not present

## 2021-09-16 DIAGNOSIS — Z7984 Long term (current) use of oral hypoglycemic drugs: Secondary | ICD-10-CM | POA: Insufficient documentation

## 2021-09-16 DIAGNOSIS — J90 Pleural effusion, not elsewhere classified: Secondary | ICD-10-CM | POA: Diagnosis not present

## 2021-09-16 DIAGNOSIS — Z6834 Body mass index (BMI) 34.0-34.9, adult: Secondary | ICD-10-CM | POA: Diagnosis not present

## 2021-09-16 DIAGNOSIS — J449 Chronic obstructive pulmonary disease, unspecified: Secondary | ICD-10-CM | POA: Insufficient documentation

## 2021-09-16 DIAGNOSIS — E1122 Type 2 diabetes mellitus with diabetic chronic kidney disease: Secondary | ICD-10-CM | POA: Insufficient documentation

## 2021-09-16 DIAGNOSIS — Z8673 Personal history of transient ischemic attack (TIA), and cerebral infarction without residual deficits: Secondary | ICD-10-CM | POA: Diagnosis not present

## 2021-09-16 DIAGNOSIS — Z79899 Other long term (current) drug therapy: Secondary | ICD-10-CM | POA: Insufficient documentation

## 2021-09-16 DIAGNOSIS — I5042 Chronic combined systolic (congestive) and diastolic (congestive) heart failure: Secondary | ICD-10-CM | POA: Diagnosis not present

## 2021-09-16 DIAGNOSIS — Z20822 Contact with and (suspected) exposure to covid-19: Secondary | ICD-10-CM | POA: Insufficient documentation

## 2021-09-16 DIAGNOSIS — N183 Chronic kidney disease, stage 3 unspecified: Secondary | ICD-10-CM | POA: Diagnosis not present

## 2021-09-16 DIAGNOSIS — Z8679 Personal history of other diseases of the circulatory system: Secondary | ICD-10-CM | POA: Insufficient documentation

## 2021-09-16 DIAGNOSIS — I480 Paroxysmal atrial fibrillation: Secondary | ICD-10-CM | POA: Insufficient documentation

## 2021-09-16 DIAGNOSIS — Z7902 Long term (current) use of antithrombotics/antiplatelets: Secondary | ICD-10-CM | POA: Diagnosis not present

## 2021-09-16 DIAGNOSIS — D509 Iron deficiency anemia, unspecified: Secondary | ICD-10-CM | POA: Diagnosis not present

## 2021-09-16 DIAGNOSIS — Z87891 Personal history of nicotine dependence: Secondary | ICD-10-CM | POA: Diagnosis not present

## 2021-09-16 DIAGNOSIS — N179 Acute kidney failure, unspecified: Secondary | ICD-10-CM | POA: Diagnosis not present

## 2021-09-16 DIAGNOSIS — N184 Chronic kidney disease, stage 4 (severe): Secondary | ICD-10-CM | POA: Diagnosis not present

## 2021-09-16 DIAGNOSIS — R0602 Shortness of breath: Secondary | ICD-10-CM | POA: Diagnosis not present

## 2021-09-16 DIAGNOSIS — M6281 Muscle weakness (generalized): Secondary | ICD-10-CM | POA: Insufficient documentation

## 2021-09-16 DIAGNOSIS — Z23 Encounter for immunization: Secondary | ICD-10-CM | POA: Insufficient documentation

## 2021-09-16 DIAGNOSIS — R079 Chest pain, unspecified: Secondary | ICD-10-CM | POA: Diagnosis not present

## 2021-09-16 LAB — BASIC METABOLIC PANEL
Anion gap: 15 (ref 5–15)
BUN: 71 mg/dL — ABNORMAL HIGH (ref 8–23)
CO2: 23 mmol/L (ref 22–32)
Calcium: 8.6 mg/dL — ABNORMAL LOW (ref 8.9–10.3)
Chloride: 95 mmol/L — ABNORMAL LOW (ref 98–111)
Creatinine, Ser: 3.61 mg/dL — ABNORMAL HIGH (ref 0.61–1.24)
GFR, Estimated: 18 mL/min — ABNORMAL LOW (ref >60)
Glucose, Bld: 178 mg/dL — ABNORMAL HIGH (ref 70–99)
Potassium: 3.7 mmol/L (ref 3.5–5.1)
Sodium: 133 mmol/L — ABNORMAL LOW (ref 135–145)

## 2021-09-16 LAB — CBC
HCT: 33.7 % — ABNORMAL LOW (ref 39.0–52.0)
Hemoglobin: 10.5 g/dL — ABNORMAL LOW (ref 13.0–17.0)
MCH: 25.9 pg — ABNORMAL LOW (ref 26.0–34.0)
MCHC: 31.2 g/dL (ref 30.0–36.0)
MCV: 83.2 fL (ref 80.0–100.0)
Platelets: 236 10*3/uL (ref 150–400)
RBC: 4.05 MIL/uL — ABNORMAL LOW (ref 4.22–5.81)
RDW: 17.1 % — ABNORMAL HIGH (ref 11.5–15.5)
WBC: 10.4 10*3/uL (ref 4.0–10.5)
nRBC: 0 % (ref 0.0–0.2)

## 2021-09-16 LAB — RESP PANEL BY RT-PCR (FLU A&B, COVID) ARPGX2
Influenza A by PCR: NEGATIVE
Influenza B by PCR: NEGATIVE
SARS Coronavirus 2 by RT PCR: NEGATIVE

## 2021-09-16 LAB — TROPONIN I (HIGH SENSITIVITY)
Troponin I (High Sensitivity): 20 ng/L — ABNORMAL HIGH (ref <18)
Troponin I (High Sensitivity): 18 ng/L — ABNORMAL HIGH (ref <18)

## 2021-09-16 LAB — GLUCOSE, CAPILLARY: Glucose-Capillary: 124 mg/dL — ABNORMAL HIGH (ref 70–99)

## 2021-09-16 LAB — PROTIME-INR
INR: 1.3 — ABNORMAL HIGH (ref 0.8–1.2)
Prothrombin Time: 15.8 seconds — ABNORMAL HIGH (ref 11.4–15.2)

## 2021-09-16 LAB — BRAIN NATRIURETIC PEPTIDE: B Natriuretic Peptide: 108.6 pg/mL — ABNORMAL HIGH (ref 0.0–100.0)

## 2021-09-16 MED ORDER — SODIUM CHLORIDE 0.9 % IV SOLN
510.0000 mg | Freq: Once | INTRAVENOUS | Status: AC
  Administered 2021-09-17: 510 mg via INTRAVENOUS
  Filled 2021-09-16: qty 17

## 2021-09-16 MED ORDER — GABAPENTIN 100 MG PO CAPS
100.0000 mg | ORAL_CAPSULE | Freq: Two times a day (BID) | ORAL | Status: DC
Start: 2021-09-16 — End: 2021-09-18
  Administered 2021-09-16 – 2021-09-18 (×4): 100 mg via ORAL
  Filled 2021-09-16 (×4): qty 1

## 2021-09-16 MED ORDER — IPRATROPIUM-ALBUTEROL 0.5-2.5 (3) MG/3ML IN SOLN
3.0000 mL | Freq: Once | RESPIRATORY_TRACT | Status: AC
Start: 2021-09-16 — End: 2021-09-16
  Administered 2021-09-16: 3 mL via RESPIRATORY_TRACT
  Filled 2021-09-16: qty 3

## 2021-09-16 MED ORDER — IPRATROPIUM-ALBUTEROL 0.5-2.5 (3) MG/3ML IN SOLN
3.0000 mL | Freq: Once | RESPIRATORY_TRACT | Status: AC
  Administered 2021-09-16: 3 mL via RESPIRATORY_TRACT
  Filled 2021-09-16: qty 3

## 2021-09-16 MED ORDER — MOMETASONE FURO-FORMOTEROL FUM 200-5 MCG/ACT IN AERO
2.0000 | INHALATION_SPRAY | Freq: Two times a day (BID) | RESPIRATORY_TRACT | Status: DC
  Administered 2021-09-16 – 2021-09-18 (×4): 2 via RESPIRATORY_TRACT
  Filled 2021-09-16: qty 8.8

## 2021-09-16 MED ORDER — IPRATROPIUM-ALBUTEROL 0.5-2.5 (3) MG/3ML IN SOLN
3.0000 mL | Freq: Four times a day (QID) | RESPIRATORY_TRACT | Status: DC | PRN
  Filled 2021-09-16: qty 3

## 2021-09-16 MED ORDER — CARVEDILOL 12.5 MG PO TABS
12.5000 mg | ORAL_TABLET | Freq: Two times a day (BID) | ORAL | Status: DC
  Administered 2021-09-16 – 2021-09-18 (×4): 12.5 mg via ORAL
  Filled 2021-09-16 (×4): qty 1

## 2021-09-16 MED ORDER — FERROUS SULFATE 325 (65 FE) MG PO TABS
325.0000 mg | ORAL_TABLET | Freq: Every day | ORAL | Status: DC
  Administered 2021-09-17 – 2021-09-18 (×2): 325 mg via ORAL
  Filled 2021-09-16 (×2): qty 1

## 2021-09-16 MED ORDER — BUDESON-GLYCOPYRROL-FORMOTEROL 160-9-4.8 MCG/ACT IN AERO
2.0000 | INHALATION_SPRAY | Freq: Two times a day (BID) | RESPIRATORY_TRACT | Status: DC
Start: 2021-09-16 — End: 2021-09-16

## 2021-09-16 MED ORDER — SODIUM CHLORIDE 0.9 % IV BOLUS: 1000.0000 mL | Freq: Once | INTRAVENOUS | Status: DC

## 2021-09-16 MED ORDER — PANTOPRAZOLE SODIUM 40 MG PO TBEC
40.0000 mg | DELAYED_RELEASE_TABLET | Freq: Every day | ORAL | Status: DC
  Administered 2021-09-16 – 2021-09-18 (×3): 40 mg via ORAL
  Filled 2021-09-16 (×3): qty 1

## 2021-09-16 MED ORDER — ACETAMINOPHEN 325 MG PO TABS: 650.0000 mg | ORAL_TABLET | ORAL | Status: DC | PRN

## 2021-09-16 MED ORDER — TETANUS-DIPHTH-ACELL PERTUSSIS 5-2.5-18.5 LF-MCG/0.5 IM SUSY
0.5000 mL | PREFILLED_SYRINGE | Freq: Once | INTRAMUSCULAR | Status: AC
  Administered 2021-09-16: 0.5 mL via INTRAMUSCULAR
  Filled 2021-09-16: qty 0.5

## 2021-09-16 MED ORDER — INSULIN ASPART 100 UNIT/ML IJ SOLN
0.0000 [IU] | Freq: Three times a day (TID) | INTRAMUSCULAR | Status: DC
  Administered 2021-09-17 (×3): 15 [IU] via SUBCUTANEOUS
  Administered 2021-09-18: 7 [IU] via SUBCUTANEOUS
  Filled 2021-09-16 (×4): qty 1

## 2021-09-16 MED ORDER — POLYETHYLENE GLYCOL 3350 17 G PO PACK: 17.0000 g | PACK | Freq: Every day | ORAL | Status: DC | PRN

## 2021-09-16 MED ORDER — VITAMIN B-12 1000 MCG PO TABS
1000.0000 ug | ORAL_TABLET | Freq: Every day | ORAL | Status: DC
  Administered 2021-09-16 – 2021-09-18 (×3): 1000 ug via ORAL
  Filled 2021-09-16 (×4): qty 1

## 2021-09-16 MED ORDER — METHYLPREDNISOLONE SODIUM SUCC 125 MG IJ SOLR
125.0000 mg | Freq: Once | INTRAMUSCULAR | Status: AC
  Administered 2021-09-16: 125 mg via INTRAVENOUS
  Filled 2021-09-16: qty 2

## 2021-09-16 MED ORDER — ASPIRIN 81 MG PO CHEW
324.0000 mg | CHEWABLE_TABLET | Freq: Once | ORAL | Status: AC
  Administered 2021-09-16: 324 mg via ORAL
  Filled 2021-09-16: qty 4

## 2021-09-16 MED ORDER — ONDANSETRON HCL 4 MG/2ML IJ SOLN: 4.0000 mg | Freq: Four times a day (QID) | INTRAMUSCULAR | Status: DC | PRN

## 2021-09-16 MED ORDER — HYDROMORPHONE HCL 1 MG/ML IJ SOLN
0.5000 mg | INTRAMUSCULAR | Status: DC | PRN
Start: 2021-09-16 — End: 2021-09-18

## 2021-09-16 MED ORDER — ROSUVASTATIN CALCIUM 10 MG PO TABS
10.0000 mg | ORAL_TABLET | Freq: Every day | ORAL | Status: DC
  Administered 2021-09-16 – 2021-09-17 (×2): 10 mg via ORAL
  Filled 2021-09-16 (×3): qty 1

## 2021-09-16 MED ORDER — INSULIN ASPART 100 UNIT/ML IJ SOLN
0.0000 [IU] | Freq: Every day | INTRAMUSCULAR | Status: DC
  Administered 2021-09-17: 3 [IU] via SUBCUTANEOUS
  Filled 2021-09-16: qty 1

## 2021-09-16 MED ORDER — ENOXAPARIN SODIUM 40 MG/0.4ML IJ SOSY: 40.0000 mg | PREFILLED_SYRINGE | INTRAMUSCULAR | Status: DC

## 2021-09-16 MED ORDER — UMECLIDINIUM BROMIDE 62.5 MCG/ACT IN AEPB
1.0000 | INHALATION_SPRAY | Freq: Every day | RESPIRATORY_TRACT | Status: DC
  Administered 2021-09-17: 1 via RESPIRATORY_TRACT
  Filled 2021-09-16: qty 7

## 2021-09-16 MED ORDER — MELATONIN 5 MG PO TABS
5.0000 mg | ORAL_TABLET | Freq: Every evening | ORAL | Status: DC | PRN
  Administered 2021-09-17: 5 mg via ORAL
  Filled 2021-09-16: qty 1

## 2021-09-16 MED ORDER — SENNA 8.6 MG PO TABS
1.0000 | ORAL_TABLET | Freq: Every day | ORAL | Status: DC
  Administered 2021-09-16 – 2021-09-17 (×2): 8.6 mg via ORAL
  Filled 2021-09-16 (×2): qty 1

## 2021-09-16 MED ORDER — APIXABAN 2.5 MG PO TABS
2.5000 mg | ORAL_TABLET | Freq: Two times a day (BID) | ORAL | Status: DC
  Administered 2021-09-16: 2.5 mg via ORAL
  Filled 2021-09-16: qty 1

## 2021-09-16 NOTE — H&P (Addendum)
?History and Physical ? ?Helen Cuff Lias WIO:[id number] DOB: [birth year] DOA: 09/16/2021 ? ?Referring physician: Moshe Cipro, PA-EDP  ?PCP: [name], NP  ?Outpatient Specialists: Cardiology, pulmonology. ?Patient coming from: Home via EMS. ? ?Chief Complaint: Chest pain. ? ?HPI: Larry Callahan is a 66 y.o. 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  Pressure-like nonradiating localized to the right side of his chest and worsened by deep inspiration and coughing.  States he was doing well this morning so went outside to work in his garden.  He tired out so went inside and sat on the couch and while he was sitting on the couch he started having chest pain.  Did not take an aspirin, stopped taking aspirin due to bruising.  EMS was activated.  On presentation to the ED work-up revealed elevated troponin 20, nonspecific changes from the twelve-lead EKG.  Chest x-ray with left pleural effusion noted.  Euvolemic on exam.  He received a full dose aspirin in the ED.  EDP requesting admission for chest pain rule out ACS.  Patient was admitted by hospitalist service. ? ?ED Course: Temperature 98.3.  BP 125/75, pulse 101, respiratory 23, oxygen saturation 96% on room air.  Lab studies remarkable for serum sodium 133, glucose 178, BUN 71, creatinine 3.61 with baseline creatinine of 2.67.  GFR 18 with baseline GFR of 25's.  Hemoglobin 10.5, MCV 83.  BNP 108 with troponin 20. ? ?Review of Systems: ?Review of systems as noted in the HPI. All other systems reviewed and are negative. ? ? ?Past Medical History:  ?Diagnosis Date  ? Allergy   ? Chronic combined systolic (congestive) and diastolic (congestive) heart failure (HCC)   ? a. 04/2019 Echo: EF 45-50%; b. 02/2019 Echo: EF 55-60%;  c. 09/2020 Echo: EF 35-40%, glob HK. Nl RV size/fxn. Mild MR.  ? CKD (chronic kidney disease), stage III ([place])   ? COPD (chronic obstructive pulmonary disease) ([place])   ? Coronary artery disease   ? a. 2011 Cath: nonobs dzs; b. 09/2020 MV: EF 38%. No signif ischemia. ? inf MI vs diaph attenuation. GI uptake artifact.  ? Diabetes mellitus without complication ([place])   ? GERD (gastroesophageal reflux disease)   ? Hyperlipidemia   ? Hypertension   ? Morbid obesity ([place])   ? NICM (nonischemic cardiomyopathy) ([place])   ? a. 04/2019 Echo: EF 45-50%; b. 02/2019 Echo: EF 55-60%; c. 09/2020 Echo: EF 35-40%, glob HK; d. 09/2020 MV: No ischemia. ? inf infarct vs attenuation.  ? OSA (obstructive sleep apnea)   ? ?Past Surgical History:  ?Procedure Laterality Date  ? BACK SURGERY    ? CARDIAC CATHETERIZATION    ? CHOLECYSTECTOMY    ? COLONOSCOPY WITH PROPOFOL N/A 04/12/2019  ? Procedure: COLONOSCOPY WITH PROPOFOL;  Surgeon: [name], MD;  Location: [facility] ENDOSCOPY;  Service: Gastroenterology;  Laterality: N/A;  ? ESOPHAGOGASTRODUODENOSCOPY N/A 04/12/2019  ? Procedure: ESOPHAGOGASTRODUODENOSCOPY (EGD);  Surgeon: [name], MD;  Location: [facility] ENDOSCOPY;  Service: Gastroenterology;  Laterality: N/A;  ? ESOPHAGOGASTRODUODENOSCOPY (EGD) WITH PROPOFOL N/A 11/21/2019  ? Procedure: ESOPHAGOGASTRODUODENOSCOPY (EGD) WITH PROPOFOL;  Surgeon: [name], MD;  Location: [facility] ENDOSCOPY;  Service: Gastroenterology;  Laterality: N/A;  ? FLEXIBLE BRONCHOSCOPY Bilateral 05/17/2019  ? Procedure: FLEXIBLE BRONCHOSCOPY;  Surgeon: [name], MD;  Location: [name] ORS;  Service: Pulmonary;  Laterality: Bilateral;  ? left arm surgery    ? nephrectomy Left   ? PARATHYROIDECTOMY    ? RIGHT HEART CATH N/A 03/29/2019  ? Procedure: RIGHT HEART CATH;  Surgeon: [name], MD;  Location: [place] CV LAB;  Service: Cardiovascular;  Laterality: N/A;  ? ? ?Social History:  reports that he has quit smoking. His smoking use  included cigarettes. He has quit using smokeless tobacco.  His smokeless tobacco use included chew. He reports that he does not drink alcohol and does not use drugs. ? ? ?Allergies  ?Allergen Reactions  ? Penicillins Rash  ? ? ?Family History  ?Problem Relation Age of Onset  ? Diabetes Mother   ?     2003  ? Lung cancer Father   ? Diabetes Sister   ? Hypertension Sister   ? Heart disease Sister   ? Cancer Sister   ? Bone cancer Brother   ?  ? ? ?Prior to Admission medications   ?Medication Sig Start Date End Date Taking? Authorizing Provider  ?Accu-Chek Softclix Lancets lancets Use to check blood sugars 4 times a day   E11.65 08/20/21   [name], NP  ?amLODipine (NORVASC) 10 MG tablet Take 1 tablet (10 mg total) by mouth daily. 08/11/21   [name], NP  ?apixaban (ELIQUIS) 2.5 MG TABS tablet Take 1 tablet (2.5 mg total) by mouth 2 (two) times daily. 05/20/21   [name], NP  ?Blood Glucose Calibration (TRUE METRIX LEVEL 1) Low SOLN Use as directed 04/04/18   [name], NP  ?Blood Glucose Monitoring Suppl (TRUE METRIX AIR GLUCOSE METER) DEVI 1 Device by Does not apply route 2 (two) times daily. 04/13/18   [name], NP  ?Budeson-Glycopyrrol-Formoterol (BREZTRI AEROSPHERE) 160-9-4.8 MCG/ACT AERO Inhale 2 puffs into the lungs in the morning and at bedtime. 07/21/21   [name], NP  ?budesonide (PULMICORT) 0.25 MG/2ML nebulizer solution Take 2 mLs (0.25 mg total) by nebulization daily for 7 days. 08/08/21 08/15/21  Swayze, Ava, DO  ?carvedilol (COREG) 12.5 MG tablet Take 1 tablet (12.5 mg total) by mouth 2 (two) times daily. 01/07/21 01/07/22  Arta Silence, MD  ?cetirizine (ZYRTEC) 10 MG tablet Take 10 mg by mouth daily.    [provider]  ?chlorthalidone (HYGROTON) 25 MG tablet Take 1 tablet (25 mg total) by mouth daily. 08/11/21   [name], NP  ?furosemide (LASIX) 40 MG tablet Take 1 tablet (40 mg total) by mouth 2 (two) times daily. 08/08/21   Swayze, Ava,  DO  ?gabapentin (NEURONTIN) 100 MG capsule Take 1 capsule (100 mg total) by mouth in the morning, at noon, and at bedtime. 08/11/21   [name], NP  ?glimepiride (AMARYL) 2 MG tablet Take 1 tablet (2 mg total) by mouth in the morning and at bedtime. Take one tab po qd with supper 09/09/21   Alisa Graff, FNP  ?glucose blood (ACCU-CHEK GUIDE) test strip Use to check blood sugars 4 times a day   E11.65 08/20/21   [name], NP  ?glucose blood (TRUE METRIX BLOOD GLUCOSE TEST) test strip Use as instructed twice daily diag E11.65 07/30/19   [name], NP  ?hydrALAZINE (APRESOLINE) 10 MG tablet Take 1 tablet (10 mg total) by mouth 3 (three) times daily. 09/09/21   Alisa Graff, FNP  ?insulin glargine, 1 Unit Dial, (TOUJEO) 300 UNIT/ML Solostar Pen Inject 20 Units into the skin daily. Reduced from prior 26 units daily. 08/08/21   Swayze,  Ava, DO  ?ipratropium-albuterol (DUONEB) 0.5-2.5 (3) MG/3ML SOLN Take 3 mLs by nebulization every 6 (six) hours as needed. 06/29/21   Danford, Suann Larry, MD  ?Emmit [place] PEN NEEDLE 32G X 6 MM MISC Use as directed 10/15/20   Lavera Guise, MD  ?omeprazole (PRILOSEC) 40 MG capsule Take 1 capsule (40 mg total) by mouth in the morning and at bedtime. 08/25/21   [name], NP  ?potassium chloride SA (KLOR-CON M) 20 MEQ tablet Take 1 tablet (20 mEq total) by mouth 2 (two) times daily. 09/10/21   Alisa Graff, FNP  ?rosuvastatin (CRESTOR) 10 MG tablet Take 1 tablet (10 mg total) by mouth at bedtime. 08/11/21   [name], NP  ?TRUEplus Lancets 33G MISC Use as directed twice daily diag E11.65 07/30/19   [name], NP  ?vitamin B-12 1000 MCG tablet Take 1 tablet (1,000 mcg total) by mouth daily. 01/16/21   Fritzi Mandes, MD  ? ? ?Physical Exam: ?BP 136/79   Pulse (!) 107   Temp 98.3 ?F (36.8 ?C) (Oral)   Resp 18   Ht _0  (1.803 m)   Wt 113.4 kg   SpO2 96%   BMI 34.87 kg/m?  ? ?General: 66 y.o. year-old male well developed well nourished in no acute  distress.  Alert and oriented x3. ?Cardiovascular: Irregular rate and rhythm with no rubs or gallops.  No thyromegaly or JVD noted.  Trace lower extremity edema. 2/4 pulses in all 4 extremities. ?Respiratory: Mild

## 2021-09-16 NOTE — ED Triage Notes (Signed)
Pt arrives via EMS from home for CP and [facility]- pt states he has a hx of COPD and states he feels like he is having an exasperation- pt ST around 104 for EMS but other VSS ?

## 2021-09-16 NOTE — ED Provider Notes (Signed)
? ?[facility] ?Provider Note ? ? ? Event Date/Time  ? First MD Initiated Contact with Patient 09/16/21 1724   ?  (approximate) ? ? ?History  ? ?Chief Complaint ?Chest Pain ? ? ?HPI ?Larry Callahan is a 66 y.o. male, history of morbid obesity, type 2 diabetes, hypertension, CHF, stage III CKD, hyperlipidemia, COPD, CVA, atrial flutter, presents to the emergency department for evaluation of chest pain.  Patient states that his chest pain started a few hours ago when he was sitting on the couch earlier this afternoon.  He states that the pain is a sharp-like sensation along the right side of his chest .  Additionally endorses shortness of breath as well.  He thought that it was a COPD exacerbation and took his inhaler, however he states that the pain did not go away.  He states that the pain has subsided slightly, though it is still present.  He states his pain is worse whenever he takes a deep breath.  Endorses chills today as well.  Denies abdominal pain, flank pain, nausea/vomiting, diarrhea, urinary symptoms, vision changes, hearing changes, rashes/lesions, numbness/tingling in upper extremities, or dizziness/lightheadedness.  ? ?History Limitations: No limitations. ? ?    ? ? ?Physical Exam  ?Triage Vital Signs: ?ED Triage Vitals [09/16/21 1713]  ?Enc Vitals Group  ?   BP 115/65  ?   Pulse Rate (!) 105  ?   Resp 20  ?   Temp 98.3 ?F (36.8 ?C)  ?   Temp Source Oral  ?   SpO2 95 %  ?   Weight 250 lb (113.4 kg)  ?   Height '5\' 11"'$  (1.803 m)  ?   Head Circumference   ?   Peak Flow   ?   Pain Score 7  ?   Pain Loc   ?   Pain Edu?   ?   Excl. in [place]?   ? ? ?Most recent vital signs: ?Vitals:  ? 09/16/21 2258 09/16/21 2307  ?BP: 127/61   ?Pulse: 72   ?Resp: 15 15  ?Temp: 98.3 ?F (36.8 ?C)   ?SpO2: 93%   ? ? ?General: Awake, NAD.  ?Skin: Warm, dry. No rashes or lesions.  ?Eyes: PERRL. Conjunctivae normal.  ?ENT: Throat clear, no erythema or exudates. Uvula midline.  ?Neck: Normal ROM. No nuchal  rigidity.  ?CV: Good peripheral perfusion.  ?Resp: Normal effort.  Mild rhonchi/wheezing present in the bases. ?Abd: Soft, non-tender. No distention.  ?Neuro: At baseline. No gross neurological deficits.  ?MSK: No gross deformities. Normal ROM in all extremities.  ? ?Other: Superficial 5 mm laceration present along the right forearm,  ? ?Physical Exam ? ? ? ?ED Results / Procedures / Treatments  ?Labs ?(all labs ordered are listed, but only abnormal results are displayed) ?Labs Reviewed  ?BASIC METABOLIC PANEL - Abnormal; Notable for the following components:  ?    Result Value  ? Sodium 133 (*)   ? Chloride 95 (*)   ? Glucose, Bld 178 (*)   ? BUN 71 (*)   ? Creatinine, Ser 3.61 (*)   ? Calcium 8.6 (*)   ? GFR, Estimated 18 (*)   ? All other components within normal limits  ?CBC - Abnormal; Notable for the following components:  ? RBC 4.05 (*)   ? Hemoglobin 10.5 (*)   ? HCT 33.7 (*)   ? MCH 25.9 (*)   ? RDW 17.1 (*)   ? All other components within  normal limits  ?PROTIME-INR - Abnormal; Notable for the following components:  ? Prothrombin Time 15.8 (*)   ? INR 1.3 (*)   ? All other components within normal limits  ?BRAIN NATRIURETIC PEPTIDE - Abnormal; Notable for the following components:  ? B Natriuretic Peptide 108.6 (*)   ? All other components within normal limits  ?GLUCOSE, CAPILLARY - Abnormal; Notable for the following components:  ? Glucose-Capillary 124 (*)   ? All other components within normal limits  ?TROPONIN I (HIGH SENSITIVITY) - Abnormal; Notable for the following components:  ? Troponin I (High Sensitivity) 20 (*)   ? All other components within normal limits  ?TROPONIN I (HIGH SENSITIVITY) - Abnormal; Notable for the following components:  ? Troponin I (High Sensitivity) 18 (*)   ? All other components within normal limits  ?RESP PANEL BY RT-PCR (FLU A&B, COVID) [id number]  ? ? ? ?EKG ?Atrial flutter, rate of 105, intermittent PVCs present, no significant ST segment changes, no axis deviations, no AV  blocks, normal QRS interval. ? ? ?RADIOLOGY ? ?ED Provider Interpretation: I personally reviewed and interpreted this chest x-ray, mild pulmonary edema present bilaterally. ? ?DG Chest 2 View ? ?Result Date: 09/16/2021 ?CLINICAL DATA:  Chest pain and shortness of breath. EXAM: CHEST - 2 VIEW COMPARISON:  08/07/2021 FINDINGS: Stable top-normal heart size. Stable to slightly smaller left pleural effusion of overall small volume. Associated left lower lobe atelectasis. Right pleural effusion has resolved since the prior x-ray. Probable mild pulmonary interstitial edema. No pneumothorax. IMPRESSION: Mild pulmonary interstitial edema. Small left pleural effusion remains which appears slightly smaller compared to the prior chest x-ray. Right pleural effusion has resolved since the prior chest x-ray. Electronically Signed   By: Aletta Edouard M.D.   On: 09/16/2021 17:53   ? ?PROCEDURES: ? ?Critical Care performed: None. ? ?Procedures ? ? ? ?MEDICATIONS ORDERED IN ED: ?Medications  ?acetaminophen (TYLENOL) tablet 650 mg (has no administration in time range)  ?ondansetron (ZOFRAN) injection 4 mg (has no administration in time range)  ?ferumoxytol (FERAHEME) 510 mg in sodium chloride 0.9 % 100 mL IVPB (has no administration in time range)  ?insulin aspart (novoLOG) injection 0-20 Units (has no administration in time range)  ?insulin aspart (novoLOG) injection 0-5 Units (0 Units Subcutaneous Not Given 09/16/21 2200)  ?apixaban (ELIQUIS) tablet 2.5 mg (2.5 mg Oral Given 09/16/21 2216)  ?carvedilol (COREG) tablet 12.5 mg (12.5 mg Oral Given 09/16/21 2216)  ?gabapentin (NEURONTIN) capsule 100 mg (100 mg Oral Given 09/16/21 2216)  ?ipratropium-albuterol (DUONEB) 0.5-2.5 (3) MG/3ML nebulizer solution 3 mL (has no administration in time range)  ?pantoprazole (PROTONIX) EC tablet 40 mg (40 mg Oral Given 09/16/21 2216)  ?rosuvastatin (CRESTOR) tablet 10 mg (10 mg Oral Given 09/16/21 2216)  ?vitamin B-12 (CYANOCOBALAMIN) tablet 1,000 mcg (1,000 mcg  Oral Given 09/16/21 2216)  ?ferrous sulfate tablet 325 mg (has no administration in time range)  ?HYDROmorphone (DILAUDID) injection 0.5 mg (has no administration in time range)  ?polyethylene glycol (MIRALAX / GLYCOLAX) packet 17 g (has no administration in time range)  ?senna (SENOKOT) tablet 8.6 mg (8.6 mg Oral Given 09/16/21 2216)  ?melatonin tablet 5 mg (has no administration in time range)  ?mometasone-formoterol (DULERA) 200-5 MCG/ACT inhaler 2 puff (2 puffs Inhalation Given 09/16/21 2217)  ?umeclidinium bromide (INCRUSE ELLIPTA) 62.5 MCG/ACT 1 puff (has no administration in time range)  ?ipratropium-albuterol (DUONEB) 0.5-2.5 (3) MG/3ML nebulizer solution 3 mL (3 mLs Nebulization Given 09/16/21 1759)  ?Tdap (BOOSTRIX) injection 0.5 mL (0.5 mLs  Intramuscular Given 09/16/21 1814)  ?methylPREDNISolone sodium succinate (SOLU-MEDROL) 125 mg/2 mL injection 125 mg (125 mg Intravenous Given 09/16/21 2100)  ?aspirin chewable tablet 324 mg (324 mg Oral Given 09/16/21 2059)  ?ipratropium-albuterol (DUONEB) 0.5-2.5 (3) MG/3ML nebulizer solution 3 mL (3 mLs Nebulization Given 09/16/21 2105)  ? ? ? ?IMPRESSION / MDM / ASSESSMENT AND PLAN / ED COURSE  ?I reviewed the triage vital signs and the nursing notes. ?             ?               ? ?Differential diagnosis includes, but is not limited to, ACS, PE, COPD exacerbation, CHF exacerbation, myocarditis/pericarditis, pneumonia, COVID-19, influenza. ? ?ED Course ?Patient appears uncomfortable, but stable.  Mildly tachycardic at 107, otherwise normal vitals.  NAD. ? ?CBC shows no leukocytosis or clinically significant anemia. ? ?BMP notable for hyperglycemia at 178, elevated creatinine 3.61 from 2.67 approximately 4 weeks prior, suggesting kidney injury.  GFR 18. ? ?Respiratory panel shows no evidence of COVID-19 or influenza. ? ?BNP notably elevated at 108.6. ? ?EKG shows atrial flutter.  Initial troponin elevated at 20.  Second troponin pending ? ?Assessment/Plan ?Patient presents with  right-sided chest pain and shortness of breath that started this afternoon.  No immediate evidence of ACS at this point.  Checks x-ray shows no evidence of pneumonia.  Likely COPD exacerbation.  [place]

## 2021-09-16 NOTE — Plan of Care (Signed)
?  Problem: Education: ?Goal: Knowledge of General Education information will improve ?Description: Including pain rating scale, medication(s)/side effects and non-pharmacologic comfort measures ?Outcome: Progressing ?  ?Problem: Health Behavior/Discharge Planning: ?Goal: Ability to manage health-related needs will improve ?Outcome: Progressing ?  ?Problem: Clinical Measurements: ?Goal: Ability to maintain clinical measurements within normal limits will improve ?Outcome: Progressing ?Goal: Will remain free from infection ?Outcome: Progressing ?Goal: Diagnostic test results will improve ?Outcome: Progressing ?Goal: Respiratory complications will improve ?Outcome: Progressing ?Goal: Cardiovascular complication will be avoided ?Outcome: Progressing ?  ?Problem: Education: ?Goal: Ability to demonstrate management of disease process will improve ?Outcome: Progressing ?Goal: Ability to verbalize understanding of medication therapies will improve ?Outcome: Progressing ?Goal: Individualized Educational Video(s) ?Outcome: Progressing ?  ?Problem: Skin Integrity: ?Goal: Risk for impaired skin integrity will decrease ?Outcome: Progressing ?  ?Problem: Safety: ?Goal: Ability to remain free from injury will improve ?Outcome: Progressing ?  ?Problem: Activity: ?Goal: Capacity to carry out activities will improve ?Outcome: Progressing ?  ?

## 2021-09-17 DIAGNOSIS — I4892 Unspecified atrial flutter: Secondary | ICD-10-CM | POA: Diagnosis not present

## 2021-09-17 DIAGNOSIS — R079 Chest pain, unspecified: Secondary | ICD-10-CM | POA: Diagnosis not present

## 2021-09-17 LAB — CBC WITH DIFFERENTIAL/PLATELET
Abs Immature Granulocytes: 0.05 10*3/uL (ref 0.00–0.07)
Basophils Absolute: 0 10*3/uL (ref 0.0–0.1)
Basophils Relative: 0 %
Eosinophils Absolute: 0 10*3/uL (ref 0.0–0.5)
Eosinophils Relative: 0 %
HCT: 33.6 % — ABNORMAL LOW (ref 39.0–52.0)
Hemoglobin: 10.4 g/dL — ABNORMAL LOW (ref 13.0–17.0)
Immature Granulocytes: 1 %
Lymphocytes Relative: 17 %
Lymphs Abs: 1.1 10*3/uL (ref 0.7–4.0)
MCH: 25.6 pg — ABNORMAL LOW (ref 26.0–34.0)
MCHC: 31 g/dL (ref 30.0–36.0)
MCV: 82.6 fL (ref 80.0–100.0)
Monocytes Absolute: 0.1 10*3/uL (ref 0.1–1.0)
Monocytes Relative: 1 %
Neutro Abs: 5 10*3/uL (ref 1.7–7.7)
Neutrophils Relative %: 81 %
Platelets: 231 10*3/uL (ref 150–400)
RBC: 4.07 MIL/uL — ABNORMAL LOW (ref 4.22–5.81)
RDW: 16.8 % — ABNORMAL HIGH (ref 11.5–15.5)
WBC: 6.2 10*3/uL (ref 4.0–10.5)
nRBC: 0 % (ref 0.0–0.2)

## 2021-09-17 LAB — GLUCOSE, CAPILLARY
Glucose-Capillary: 301 mg/dL — ABNORMAL HIGH (ref 70–99)
Glucose-Capillary: 329 mg/dL — ABNORMAL HIGH (ref 70–99)
Glucose-Capillary: 331 mg/dL — ABNORMAL HIGH (ref 70–99)
Glucose-Capillary: 294 mg/dL — ABNORMAL HIGH (ref 70–99)

## 2021-09-17 LAB — BASIC METABOLIC PANEL
Anion gap: 15 (ref 5–15)
BUN: 80 mg/dL — ABNORMAL HIGH (ref 8–23)
CO2: 20 mmol/L — ABNORMAL LOW (ref 22–32)
Calcium: 8.6 mg/dL — ABNORMAL LOW (ref 8.9–10.3)
Chloride: 98 mmol/L (ref 98–111)
Creatinine, Ser: 3.48 mg/dL — ABNORMAL HIGH (ref 0.61–1.24)
GFR, Estimated: 18 mL/min — ABNORMAL LOW (ref >60)
Glucose, Bld: 356 mg/dL — ABNORMAL HIGH (ref 70–99)
Potassium: 3.8 mmol/L (ref 3.5–5.1)
Sodium: 133 mmol/L — ABNORMAL LOW (ref 135–145)

## 2021-09-17 LAB — MAGNESIUM: Magnesium: 2.3 mg/dL (ref 1.7–2.4)

## 2021-09-17 MED ORDER — LIDOCAINE 5 % EX PTCH
1.0000 | MEDICATED_PATCH | CUTANEOUS | Status: DC
  Administered 2021-09-17 – 2021-09-18 (×2): 1 via TRANSDERMAL
  Filled 2021-09-17 (×2): qty 1

## 2021-09-17 MED ORDER — APIXABAN 5 MG PO TABS
5.0000 mg | ORAL_TABLET | Freq: Two times a day (BID) | ORAL | Status: DC
Start: 2021-09-17 — End: 2021-09-18
  Administered 2021-09-17 – 2021-09-18 (×3): 5 mg via ORAL
  Filled 2021-09-17 (×3): qty 1

## 2021-09-17 MED ORDER — INSULIN GLARGINE-YFGN 100 UNIT/ML ~~LOC~~ SOLN
15.0000 [IU] | Freq: Every day | SUBCUTANEOUS | Status: DC
Start: 2021-09-17 — End: 2021-09-18
  Administered 2021-09-17 – 2021-09-18 (×2): 15 [IU] via SUBCUTANEOUS
  Filled 2021-09-17 (×2): qty 0.15

## 2021-09-17 NOTE — Progress Notes (Signed)
?PROGRESS NOTE ? ? ? ?Larry Callahan Susan  PNT:[id number] DOB: November 24, 1955 DOA: 09/16/2021 ?PCP: [name], NP  ? ? ?Brief Narrative:  ? 67 y.o. 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  Pressure-like nonradiating localized to the right side of his chest and worsened by deep inspiration and coughing.  States he was doing well this morning so went outside to work in his garden.  He tired out so went inside and sat on the couch and while he was sitting on the couch he started having chest pain.  Did not take an aspirin, stopped taking aspirin due to bruising.  EMS was activated.  On presentation to the ED work-up revealed elevated troponin 20, nonspecific changes from the twelve-lead EKG.  Chest x-ray with left pleural effusion noted.  Euvolemic on exam.  He received a full dose aspirin in the ED ? ?Seen in consultation by cardiology.  No plans for inpatient ischemic evaluation.  Kidney function improved but not yet at baseline.  Patient continues to endorse right-sided chest pain. ? ?Assessment & Plan: ?  ?Active Problems: ?  Chest pain ? ?Atypical chest pain ?Worsened by deep inspiration and cough, pleuritic. ?Patient is on DOAC for paroxysmal A-fib, low risk for pulmonary embolism. ?Onset while at rest. ?Received a full dose aspirin in the ED. ?Troponins flat with no significant delta ?Adherence to [place] therapy is unclear ?Plan: ?Continue telemetry monitoring for next 24 hours ?EKG as needed chest pain ?If telemetry reassuring anticipate discharge 4/8 ?  ?AKI on CKD 4, likely prerenal in the setting of diuretics use. ?Baseline creatinine appears to be 2.6 with GFR 25 ?Presented with creatinine of 3.61 with GFR 50 ?Volume status euvolemic to hypovolemic ?Plan: ?Trend  BMP ?Hold home diuretics ?Avoid nephrotoxins ?Monitor UOP ?If kidney function approaching baseline anticipate discharge 4/8 ?  ?HFrEF 40 to 45% ?Euvolemic on exam ?Last 2D echo done on 08/08/2021 revealed LVEF 40 to 45%, LV global hypokinesis. ?Start strict I's and O's and daily weight. ?Resume home cardiac medications, hold off diuretics due to AKI. ?  ?Anemia of chronic disease in the setting of advanced CKD ?Iron deficiency anemia ?Hemoglobin 10.5 which is at baseline. ?No overt bleeding. ?Continue to monitor H&H. ?  ?Iron deficiency anemia ?Iron studies done on 08/06/2021 showed markable iron deficiency. ?Not on p.o. iron supplement ?Give 1 dose of Feraheme and start ferrous sulfate 325 mg daily. ?  ?Paroxysmal A-fib ?Now rate is controlled ?Resume home regimen ?Resume home Eliquis for CVA prevention. ?Monitor on telemetry. ?  ?COPD ?Stable, not in exacerbation ?Resume home bronchodilators ?O2 saturation 97% on room air. ?  ?Hypertension ?BP stable ?Resume home regimen. ?Hold off home diuretics. ?  ?Type 2 diabetes with hyperglycemia ?Hold off home oral hypoglycemics ?Hemoglobin A1c, 7.0 on 08/06/2021 ?Start insulin sliding scale. ?  ?Diabetic polyneuropathy ?Resume home regimen. ?  ?Obesity ?BMI 34 ?Recommend weight loss outpatient with regular physical activity and healthy dieting. ?  ?Right upper extremity wound, POA ?From nail scratch at home on 09/15/2021 ?Received tetanus shot on 09/16/2021 in the ED ?Continue local wound care ? ? ?DVT prophylaxis: Eliquis ?Code Status: Full ?Family Communication: None ?Disposition Plan: Status is: Observation ?The patient remains OBS appropriate and will d/c before 2 midnights. ? ? ?Level of care: Telemetry Cardiac ? ?Consultants:  ?Cardiology ? ?  Procedures:  ?None ? ?Antimicrobials: ?None ? ? ?Subjective: ?Seen and examined.  Continues to endorse right-sided chest pain ? ?Objective: ?Vitals:  ? 09/16/21 2307 09/17/21 0323 09/17/21 0500 09/17/21 0729  ?BP:  118/62  132/71   ?Pulse:  (!) 53  78  ?Resp: '15 14  18  '$ ?Temp:  98.3 ?F (36.8 ?C)  98 ?F (36.7 ?C)  ?TempSrc:      ?SpO2:  94%  96%  ?Weight:   113.6 kg   ?Height:      ? ? ?Intake/Output Summary (Last 24 hours) at 09/17/2021 1134 ?Last data filed at 09/17/2021 0030 ?Gross per 24 hour  ?Intake 100 ml  ?Output 550 ml  ?Net -450 ml  ? ?Filed Weights  ? 09/16/21 1713 09/17/21 0500  ?Weight: 113.4 kg 113.6 kg  ? ? ?Examination: ? ?General exam: Appears calm and comfortable  ?Respiratory system: Clear to auscultation. Respiratory effort normal. ?Cardiovascular system: S1-S2, RRR, no murmurs, no pedal edema, right-sided chest pain.  Reproducible on palpation ?Gastrointestinal system: Soft, NT/ND, normal bowel sounds ?Central nervous system: Alert and oriented. No focal neurological deficits. ?Extremities: Symmetric 5 x 5 power. ?Skin: No rashes, lesions or ulcers ?Psychiatry: Judgement and insight appear normal. Mood & affect appropriate.  ? ? ? ?Data Reviewed: I have personally reviewed following labs and imaging studies ? ?CBC: ?Recent Labs  ?Lab 09/16/21 ?1715 09/17/21 ?0942  ?WBC 10.4 6.2  ?NEUTROABS  --  5.0  ?HGB 10.5* 10.4*  ?HCT 33.7* 33.6*  ?MCV 83.2 82.6  ?PLT 236 231  ? ?Basic Metabolic Panel: ?Recent Labs  ?Lab 09/16/21 ?1715 09/17/21 ?0942  ?NA 133* 133*  ?K 3.7 3.8  ?CL 95* 98  ?CO2 23 20*  ?GLUCOSE 178* 356*  ?BUN 71* 80*  ?CREATININE 3.61* 3.48*  ?CALCIUM 8.6* 8.6*  ?MG  --  2.3  ? ?GFR: ?Estimated Creatinine Clearance: 26 mL/min (A) (by C-G formula based on SCr of 3.48 mg/dL (H)). ?Liver Function Tests: ?No results for input(s): AST, ALT, ALKPHOS, BILITOT, PROT, ALBUMIN in the last 168 hours. ?No results for input(s): LIPASE, AMYLASE in the last 168 hours. ?No results for input(s): AMMONIA in the last 168 hours. ?Coagulation Profile: ?Recent Labs  ?Lab 09/16/21 ?1715  ?INR 1.3*  ? ?Cardiac Enzymes: ?No results for input(s): CKTOTAL, CKMB, CKMBINDEX, TROPONINI in the last 168 hours. ?BNP (last 3 results) ?No results for  input(s): PROBNP in the last 8760 hours. ?HbA1C: ?No results for input(s): HGBA1C in the last 72 hours. ?CBG: ?Recent Labs  ?Lab 09/16/21 ?2200 09/17/21 ?0729  ?GLUCAP 124* 301*  ? ?Lipid Profile: ?No results for input(s): CHOL, HDL, LDLCALC, TRIG, CHOLHDL, LDLDIRECT in the last 72 hours. ?Thyroid Function Tests: ?No results for input(s): TSH, T4TOTAL, FREET4, T3FREE, THYROIDAB in the last 72 hours. ?Anemia Panel: ?No results for input(s): VITAMINB12, FOLATE, FERRITIN, TIBC, IRON, RETICCTPCT in the last 72 hours. ?Sepsis Labs: ?No results for input(s): PROCALCITON, LATICACIDVEN in the last 168 hours. ? ?Recent Results (from the past 240 hour(s))  ?Resp Panel by RT-PCR (Flu A&B, Covid) Nasopharyngeal Swab     Status: None  ? Collection Time: 09/16/21  6:06 PM  ? Specimen: Nasopharyngeal Swab; Nasopharyngeal(NP) swabs in vial transport medium  ?Result Value Ref Range Status  ? SARS Coronavirus 2 by RT PCR NEGATIVE NEGATIVE Final  ?  Comment: (NOTE) ?SARS-CoV-2 target nucleic acids are NOT DETECTED. ? ?The SARS-CoV-2 RNA is generally detectable in upper respiratory ?specimens during the acute phase of infection. The lowest ?concentration of SARS-CoV-2  viral copies this assay can detect is ?138 copies/mL. A negative result does not preclude SARS-Cov-2 ?infection and should not be used as the sole basis for treatment or ?other patient management decisions. A negative result may occur with  ?improper specimen collection/handling, submission of specimen other ?than nasopharyngeal swab, presence of viral mutation(s) within the ?areas targeted by this assay, and inadequate number of viral ?copies(<138 copies/mL). A negative result must be combined with ?clinical observations, patient history, and epidemiological ?information. The expected result is Negative. ? ?Fact Sheet for Patients:  ?EntrepreneurPulse.com.au ? ?Fact Sheet for Healthcare Providers:  ?IncredibleEmployment.be ? ?This test  is no t yet approved or cleared by the [place] FDA and  ?has been authorized for detection and/or diagnosis of SARS-CoV-2 by ?FDA under an Emergency Use Authorization (EUA). This EUA will remain  ?in effect (me

## 2021-09-17 NOTE — Evaluation (Signed)
Physical Therapy Evaluation ?Patient Details ?Name: Larry Callahan ?MRN: [id number] ?DOB: 26-Feb-1956 ?Today's Date: 09/17/2021 ? ?History of Present Illness ? Pt is a 66 y.o. male with medical history significant for nonischemic cardiomyopathy, paroxysmal A-fib on Eliquis, type 2 diabetes, hypertension, OSA, HFrEF 40-45%, A-flutter, PAD, COPD, former tobacco user, GERD, who presented to [facility] ED with complaints of sudden onset chest pain. MD assessment includes: Atypical chest pain with elevated high-sensitivity troponin, AKI on CKD 4, anemia of chronic disease, iron deficiency anemia, and RUE wound. ?  ?Clinical Impression ? Pt independent with all functional tasks including amb 400+ feet without an AD with SpO2 and HR WNL on room air and no adverse symptoms noted.  Pt reported that he had just reached all of his goals with HHPT within the last week and had just been discharged from their care.  No skilled PT needs identified.  Will complete PT orders at this time but will reassess pt pending a change in status upon receipt of new PT orders.   ?   ? ?Recommendations for follow up therapy are one component of a multi-disciplinary discharge planning process, led by the attending physician.  Recommendations may be updated based on patient status, additional functional criteria and insurance authorization. ? ?Follow Up Recommendations No PT follow up ? ?  ?Assistance Recommended at Discharge None  ?Patient can return home with the following ? Assist for transportation ? ?  ?Equipment Recommendations None recommended by PT  ?Recommendations for Other Services ?    ?  ?Functional Status Assessment Patient has not had a recent decline in their functional status  ? ?  ?Precautions / Restrictions Precautions ?Precautions: Fall ?Restrictions ?Weight Bearing Restrictions: No  ? ?  ? ?Mobility ? Bed Mobility ?Overal bed mobility: Independent ?  ?  ?  ?  ?  ?  ?General bed mobility comments: Good speed and effort ?   ? ?Transfers ?Overall transfer level: Independent ?  ?  ?  ?  ?  ?  ?  ?  ?General transfer comment: Good eccentric and concentric control and stability ?  ? ?Ambulation/Gait ?Ambulation/Gait assistance: Independent ?Gait Distance (Feet): 400 Feet ?Assistive device: None ?  ?  ?  ?  ?General Gait Details: Min decreased cadence but steady throughout with SpO2 and HR WNL on room air ? ?Stairs ?  ?  ?  ?  ?  ? ?Wheelchair Mobility ?  ? ?Modified Rankin (Stroke Patients Only) ?  ? ?  ? ?Balance Overall balance assessment: No apparent balance deficits (not formally assessed) ?  ?  ?  ?  ?  ?  ?  ?  ?  ?  ?  ?  ?  ?  ?  ?  ?  ?  ?   ? ? ? ?Pertinent Vitals/Pain Pain Assessment ?Pain Assessment: 0-10 ?Pain Score: 7  ?Pain Location: R side ?Pain Descriptors / Indicators: Sore ?Pain Intervention(s): Repositioned, Premedicated before session, Monitored during session  ? ? ?Home Living Family/patient expects to be discharged to:: Private residence ?Living Arrangements: Alone ?Available Help at Discharge: Family;Available PRN/intermittently ?Type of Home: Mobile home ?Home Access: Stairs to enter ?Entrance Stairs-Rails: Left ?Entrance Stairs-Number of Steps: 5 ?  ?Home Layout: One level ?Home Equipment: Kasandra Knudsen - single point ?Additional Comments: CPAP  ?  ?Prior Function   ?  ?  ?  ?  ?  ?  ?Mobility Comments: Ind amb community distances without an AD, no fall history ?ADLs Comments: Ind  with ADLs ?  ? ? ?Hand Dominance  ?   ? ?  ?Extremity/Trunk Assessment  ? Upper Extremity Assessment ?Upper Extremity Assessment: Overall WFL for tasks assessed ?  ? ?Lower Extremity Assessment ?Lower Extremity Assessment: Overall WFL for tasks assessed ?  ? ?   ?Communication  ? Communication: No difficulties  ?Cognition Arousal/Alertness: Awake/alert ?Behavior During Therapy: [facility] for tasks assessed/performed ?Overall Cognitive Status: Within Functional Limits for tasks assessed ?  ?  ?  ?  ?  ?  ?  ?  ?  ?  ?  ?  ?  ?  ?  ?  ?  ?  ?  ? ?   ?General Comments   ? ?  ?Exercises    ? ?Assessment/Plan  ?  ?PT Assessment Patient does not need any further PT services  ?PT Problem List   ? ?   ?  ?PT Treatment Interventions     ? ?PT Goals (Current goals can be found in the Care Plan section)  ?Acute Rehab PT Goals ?PT Goal Formulation: All assessment and education complete, DC therapy ? ?  ?Frequency   ?  ? ? ?Co-evaluation   ?  ?  ?  ?  ? ? ?  ?AM-PAC PT "6 Clicks" Mobility  ?Outcome Measure Help needed turning from your back to your side while in a flat bed without using bedrails?: None ?Help needed moving from lying on your back to sitting on the side of a flat bed without using bedrails?: None ?Help needed moving to and from a bed to a chair (including a wheelchair)?: None ?Help needed standing up from a chair using your arms (e.g., wheelchair or bedside chair)?: None ?Help needed to walk in hospital room?: None ?Help needed climbing 3-5 steps with a railing? : None ?6 Click Score: 24 ? ?  ?End of Session Equipment Utilized During Treatment: Gait belt ?Activity Tolerance: Patient tolerated treatment well ?Patient left: in bed;with call bell/phone within reach ?Nurse Communication: Mobility status ?PT Visit Diagnosis: Muscle weakness (generalized) (M62.81) ?  ? ?Time: 4503-8882 ?PT Time Calculation (min) (ACUTE ONLY): 19 min ? ? ?Charges:   PT Evaluation ?$PT Eval Low Complexity: 1 Low ?  ?  ?   ? ?D. Royetta Asal PT, DPT ?09/17/21, 10:44 AM ? ? ?

## 2021-09-17 NOTE — Progress Notes (Signed)
Mobility Specialist - Progress Note ? ? 09/17/21 1500  ?Mobility  ?Activity Ambulated independently in hallway  ?Level of Assistance Independent  ?Assistive Device None  ?Distance Ambulated (ft) 100 ft  ?Activity Response Tolerated well  ?$Mobility charge 1 Mobility  ? ? ? ?Pt ambulated in hallway independently. No complaints.  ? ? ?Kathee Delton ?Mobility Specialist ?09/17/21, 3:28 PM ? ? ? ? ?

## 2021-09-17 NOTE — Progress Notes (Signed)
Mobility Specialist - Progress Note ? ? ? 09/17/21 1600  ?Mobility  ?Activity Ambulated independently in hallway;Stood at bedside;Dangled on edge of bed  ?Level of Assistance Independent  ?Assistive Device None  ?Distance Ambulated (ft) 400 ft  ?Activity Response Tolerated well  ?$Mobility charge 1 Mobility  ? ? ?Pre-mobility: 93 HR, 97% SpO2 ?During mobility: 96-110HR, BP, 95%SPO2 ? ?Pt sitting EOB upon arrival using RA. Pt completes STS indep and ambulates 2 laps around the nursing station voicing no complaints. Pt returns EOB with needs in reach. ? ?Larry Callahan ?Mobility Specialist ?09/17/21, 4:10 PM ? ? ?

## 2021-09-17 NOTE — Consult Note (Signed)
? ? ? ?Cardiology Consultation:  ? ?Patient ID: Larry Callahan; [id number]; 24-Mar-1956  ? ?Admit date: 09/16/2021 ?Date of Consult: 09/17/2021 ? ?Primary Care Provider: [name], NP ?Primary Cardiologist: Rockey Situ ?Primary Electrophysiologist:  None ? ? ?Patient Profile:  ? ?Larry Callahan is a 66 y.o. male with a hx of nonobstructive CAD by LHC in 2011, chronic combined systolic and diastolic CHF, atrial flutter diagnosed in 12/2020, pulmonary hypertension, CKD stage III status post left sided nephrectomy with a baseline serum creatinine around 2, IDDM2, TIA, HTN, iron deficiency anemia, prior tobacco use, OSA on CPAP, and morbid obesity  who is being seen today for the evaluation of atypical chest pain at the request of Dr. Nevada Crane. ? ?History of Present Illness:  ? ?Larry Callahan was evaluated in 2020, after several ED visits, for ongoing dyspnea, with CTA of the chest being negative for PE with noted aortic atherosclerosis and coronary artery calcifications.  Echo showed an EF of 55 to 60%, mild LVH, diastolic dysfunction, normal RV systolic function and ventricular cavity size, severely dilated left atrium, trivial tricuspid regurgitation, and mild aortic valve sclerosis without stenosis.  Lexiscan MPI showed no significant ischemia and was read as high risk secondary to LVEF less than 30%.  Due to persisting symptoms he was again admitted in 2020 with VQ scan being low risk for PE.  RHC showed normal right heart pressures.  Symptoms were felt to be multifactorial including COPD, obesity, physical deconditioning, and anemia.  He was admitted in late 2020 with continued dyspnea.  Repeat echo showed an EF of 45 to 50%, mild LVH, normal RV systolic function and ventricular cavity size, moderately dilated left atrium, and mild to moderate aortic valve sclerosis without stenosis.  ?   ?He was admitted in 09/2020 for chest.  Lexiscan MPI on 10/08/2020 showed no significant ischemia.  There was a large  perfusion defect involving the inferior wall that was mildly better with stress than rest with images concerning for diaphragmatic attenuation artifact though unable to exclude old inferior MI.  Overall this was read as a moderate risk scan given depressed EF.  Medical therapy was advised.    ? ?He has had numerous ED visits and hospital admissions for varying complaints including atypical chest pain, COPD exacerbation, influenza, syncope, headache, and left flank pain.  ? ?He was admitted in 12/2020 with chest pain and normal high-sensitivity troponin.  He was noted to be in new onset atrial flutter with RVR.  CTA chest was negative for PE.  Recommendation was to pursue outpatient DCCV.  He has been lost to cardiology follow-up since.  ? ?He presented to [facility] on 09/16/2021 with onset of right-sided sharp chest pain that was worse with deep inspiration. He has noted stable chronic dyspnea. No dizziness, presyncope, syncope, palpitations, lower extremity swelling, orthopnea, PND, or early satiety. He also complains of bilateral knee pain and erythema as well as easy bruising. He is uncertain if he has missed any doses of Eliquis. He has not followed up with [name] since starting this medication in 12/2020. In reviewing his chart, he was discharged in 12/2020 on the incorrect dose of Eliquis and has been subtherapeutic. Vitals stable.  Afebrile.  EKG showed atrial flutter with RVR with variable AV block, 105 bpm, occasional PVCs, and nonspecific ST-T changes.  High-sensitivity troponin 20 with a delta troponin of 18.  BNP 108.  Hgb 10.5.  Sodium 133.  Potassium 3.7.  BUN 71.  Serum creatinine 3.61 with a baseline  around 2.4-2.5.  Chest x-ray with mild pulmonary interstitial edema with a small left pleural effusion that appeared smaller when compared to prior study as well as resolution of right pleural effusion when compared to prior study. Currently, chest pain free and is hopeful to go home today.  ? ? ? ?Past Medical  History:  ?Diagnosis Date  ? Allergy   ? Chronic combined systolic (congestive) and diastolic (congestive) heart failure (HCC)   ? a. 04/2019 Echo: EF 45-50%; b. 02/2019 Echo: EF 55-60%; c. 09/2020 Echo: EF 35-40%, glob HK. Nl RV size/fxn. Mild MR.  ? CKD (chronic kidney disease), stage III ([place])   ? COPD (chronic obstructive pulmonary disease) ([place])   ? Coronary artery disease   ? a. 2011 Cath: nonobs dzs; b. 09/2020 MV: EF 38%. No signif ischemia. ? inf MI vs diaph attenuation. GI uptake artifact.  ? Diabetes mellitus without complication ([place])   ? GERD (gastroesophageal reflux disease)   ? Hyperlipidemia   ? Hypertension   ? Morbid obesity ([place])   ? NICM (nonischemic cardiomyopathy) ([place])   ? a. 04/2019 Echo: EF 45-50%; b. 02/2019 Echo: EF 55-60%; c. 09/2020 Echo: EF 35-40%, glob HK; d. 09/2020 MV: No ischemia. ? inf infarct vs attenuation.  ? OSA (obstructive sleep apnea)   ? ? ?Past Surgical History:  ?Procedure Laterality Date  ? BACK SURGERY    ? CARDIAC CATHETERIZATION    ? CHOLECYSTECTOMY    ? COLONOSCOPY WITH PROPOFOL N/A 04/12/2019  ? Procedure: COLONOSCOPY WITH PROPOFOL;  Surgeon: [name], MD;  Location: [facility] ENDOSCOPY;  Service: Gastroenterology;  Laterality: N/A;  ? ESOPHAGOGASTRODUODENOSCOPY N/A 04/12/2019  ? Procedure: ESOPHAGOGASTRODUODENOSCOPY (EGD);  Surgeon: [name], MD;  Location: [facility] ENDOSCOPY;  Service: Gastroenterology;  Laterality: N/A;  ? ESOPHAGOGASTRODUODENOSCOPY (EGD) WITH PROPOFOL N/A 11/21/2019  ? Procedure: ESOPHAGOGASTRODUODENOSCOPY (EGD) WITH PROPOFOL;  Surgeon: [name], MD;  Location: [facility] ENDOSCOPY;  Service: Gastroenterology;  Laterality: N/A;  ? FLEXIBLE BRONCHOSCOPY Bilateral 05/17/2019  ? Procedure: FLEXIBLE BRONCHOSCOPY;  Surgeon: [name], MD;  Location: [name] ORS;  Service: Pulmonary;  Laterality: Bilateral;  ? left arm surgery    ? nephrectomy Left   ? PARATHYROIDECTOMY    ? RIGHT HEART CATH N/A 03/29/2019  ? Procedure: RIGHT HEART CATH;   Surgeon: [name], MD;  Location: [facility] CV LAB;  Service: Cardiovascular;  Laterality: N/A;  ?  ? ?Home Meds: ?Prior to Admission medications   ?Medication Sig Start Date End Date Taking? Authorizing Provider  ?amLODipine (NORVASC) 10 MG tablet Take 1 tablet (10 mg total) by mouth daily. 08/11/21  Yes Abernathy, Yetta Flock, NP  ?apixaban (ELIQUIS) 2.5 MG TABS tablet Take 1 tablet (2.5 mg total) by mouth 2 (two) times daily. 05/20/21  Yes [name], NP  ?Budeson-Glycopyrrol-Formoterol (BREZTRI AEROSPHERE) 160-9-4.8 MCG/ACT AERO Inhale 2 puffs into the lungs in the morning and at bedtime. 07/21/21  Yes Abernathy, Yetta Flock, NP  ?carvedilol (COREG) 12.5 MG tablet Take 1 tablet (12.5 mg total) by mouth 2 (two) times daily. 01/07/21 01/07/22 Yes Arta Silence, MD  ?cetirizine (ZYRTEC) 10 MG tablet Take 10 mg by mouth daily.   Yes [provider]  ?chlorthalidone (HYGROTON) 25 MG tablet Take 1 tablet (25 mg total) by mouth daily. 08/11/21  Yes Abernathy, Yetta Flock, NP  ?furosemide (LASIX) 40 MG tablet Take 1 tablet (40 mg total) by mouth 2 (two) times daily. 08/08/21  Yes Swayze, Ava, DO  ?gabapentin (NEURONTIN) 100 MG capsule Take 1 capsule (100 mg  total) by mouth in the morning, at noon, and at bedtime. 08/11/21  Yes Abernathy, Yetta Flock, NP  ?glimepiride (AMARYL) 2 MG tablet Take 1 tablet (2 mg total) by mouth in the morning and at bedtime. Take one tab po qd with supper 09/09/21  Yes Alisa Graff, FNP  ?hydrALAZINE (APRESOLINE) 10 MG tablet Take 1 tablet (10 mg total) by mouth 3 (three) times daily. 09/09/21  Yes Alisa Graff, FNP  ?insulin glargine, 1 Unit Dial, (TOUJEO) 300 UNIT/ML Solostar Pen Inject 20 Units into the skin daily. Reduced from prior 26 units daily. 08/08/21  Yes Swayze, Ava, DO  ?ipratropium-albuterol (DUONEB) 0.5-2.5 (3) MG/3ML SOLN Take 3 mLs by nebulization every 6 (six) hours as needed. 06/29/21  Yes Danford, Suann Larry, MD  ?omeprazole (PRILOSEC) 40 MG capsule Take 1 capsule  (40 mg total) by mouth in the morning and at bedtime. 08/25/21  Yes Abernathy, Yetta Flock, NP  ?potassium chloride SA (KLOR-CON M) 20 MEQ tablet Take 1 tablet (20 mEq total) by mouth 2 (two) times daily. 09/10/21  Alfred Levins

## 2021-09-18 DIAGNOSIS — R079 Chest pain, unspecified: Secondary | ICD-10-CM | POA: Diagnosis not present

## 2021-09-18 LAB — CBC WITH DIFFERENTIAL/PLATELET
Abs Immature Granulocytes: 0.07 10*3/uL (ref 0.00–0.07)
Basophils Absolute: 0 10*3/uL (ref 0.0–0.1)
Basophils Relative: 0 %
Eosinophils Absolute: 0.1 10*3/uL (ref 0.0–0.5)
Eosinophils Relative: 1 %
HCT: 30.3 % — ABNORMAL LOW (ref 39.0–52.0)
Hemoglobin: 9.6 g/dL — ABNORMAL LOW (ref 13.0–17.0)
Immature Granulocytes: 1 %
Lymphocytes Relative: 20 %
Lymphs Abs: 2.1 10*3/uL (ref 0.7–4.0)
MCH: 25.7 pg — ABNORMAL LOW (ref 26.0–34.0)
MCHC: 31.7 g/dL (ref 30.0–36.0)
MCV: 81.2 fL (ref 80.0–100.0)
Monocytes Absolute: 0.7 10*3/uL (ref 0.1–1.0)
Monocytes Relative: 6 %
Neutro Abs: 7.4 10*3/uL (ref 1.7–7.7)
Neutrophils Relative %: 72 %
Platelets: 248 10*3/uL (ref 150–400)
RBC: 3.73 MIL/uL — ABNORMAL LOW (ref 4.22–5.81)
RDW: 16.5 % — ABNORMAL HIGH (ref 11.5–15.5)
WBC: 10.3 10*3/uL (ref 4.0–10.5)
nRBC: 0 % (ref 0.0–0.2)

## 2021-09-18 LAB — BASIC METABOLIC PANEL
Anion gap: 11 (ref 5–15)
BUN: 85 mg/dL — ABNORMAL HIGH (ref 8–23)
CO2: 24 mmol/L (ref 22–32)
Calcium: 8.3 mg/dL — ABNORMAL LOW (ref 8.9–10.3)
Chloride: 95 mmol/L — ABNORMAL LOW (ref 98–111)
Creatinine, Ser: 3.13 mg/dL — ABNORMAL HIGH (ref 0.61–1.24)
GFR, Estimated: 21 mL/min — ABNORMAL LOW (ref >60)
Glucose, Bld: 230 mg/dL — ABNORMAL HIGH (ref 70–99)
Potassium: 3.2 mmol/L — ABNORMAL LOW (ref 3.5–5.1)
Sodium: 130 mmol/L — ABNORMAL LOW (ref 135–145)

## 2021-09-18 LAB — GLUCOSE, CAPILLARY
Glucose-Capillary: 215 mg/dL — ABNORMAL HIGH (ref 70–99)
Glucose-Capillary: 236 mg/dL — ABNORMAL HIGH (ref 70–99)

## 2021-09-18 LAB — MAGNESIUM: Magnesium: 2.3 mg/dL (ref 1.7–2.4)

## 2021-09-18 MED ORDER — POTASSIUM CHLORIDE CRYS ER 20 MEQ PO TBCR
40.0000 meq | EXTENDED_RELEASE_TABLET | ORAL | Status: AC
  Administered 2021-09-18 (×2): 40 meq via ORAL
  Filled 2021-09-18 (×2): qty 2

## 2021-09-18 MED ORDER — LIDOCAINE 5 % EX PTCH: 1.0000 | MEDICATED_PATCH | CUTANEOUS | 0 refills | Status: DC

## 2021-09-18 MED ORDER — FUROSEMIDE 40 MG PO TABS: 40.0000 mg | ORAL_TABLET | Freq: Every day | ORAL | 0 refills | Status: DC

## 2021-09-18 NOTE — Discharge Summary (Signed)
Physician Discharge Summary  ?Larry Callahan ACZ:[id number] DOB: 13-Jul-1955 DOA: 09/16/2021 ? ?PCP: [name], NP ? ?Admit date: 09/16/2021 ?Discharge date: 09/18/2021 ? ?Admitted From: Home ?Disposition:  Home with home health ? ?Recommendations for Outpatient Follow-up:  ?Follow up with PCP in 1-2 weeks ?Follow-up with cardiology as directed ? ?Home Health: No ?Equipment/Devices: None ? ?Discharge Condition: Stable ?CODE STATUS: Full ?Diet recommendation: Heart healthy, fluid restrict ? ?Brief/Interim Summary: ?66 y.o. male with medical history significant for nonischemic cardiomyopathy, paroxysmal A-fib on Eliquis, type 2 diabetes, hypertension, OSA, HFrEF 40-45%, A-flutter, PAD, COPD, former tobacco user, GERD, who presented to [facility] ED with complaints of sudden onset chest pain this afternoon.  Pressure-like nonradiating localized to the right side of his chest and worsened by deep inspiration and coughing.  States he was doing well this morning so went outside to work in his garden.  He tired out so went inside and sat on the couch and while he was sitting on the couch he started having chest pain.  Did not take an aspirin, stopped taking aspirin due to bruising.  EMS was activated.  On presentation to the ED work-up revealed elevated troponin 20, nonspecific changes from the twelve-lead EKG.  Chest x-ray with left pleural effusion noted.  Euvolemic on exam.  He received a full dose aspirin in the ED ?  ?Seen in consultation by cardiology.  No plans for inpatient ischemic evaluation.  Kidney function improved but not yet at baseline.  Patient continues to endorse right-sided chest pain. ? ?NSTEMI ruled out.  No indication for ischemic evaluation during hospitalization.  At time of discharge kidney function improving but not yet at baseline.  Will recommend decreasing Lasix dose to 40 mg daily and DC home chlorthalidone.  Patient has a scheduled follow-up appointment with heart failure NP.  Per  documentation he has missed several cardiology appointments.  I stressed with him the importance of medical adherence.  He expressed understanding. ? ? ? ?Discharge Diagnoses:  ?Active Problems: ?  Chest pain ? ?Atypical chest pain ?Worsened by deep inspiration and cough, pleuritic. ?Patient is on DOAC for paroxysmal A-fib, low risk for pulmonary embolism. ?Onset while at rest. ?Received a full dose aspirin in the ED. ?Troponins flat with no significant delta ?Adherence to [place] therapy is unclear ?Plan: ?NSTEMI ruled out.  Pain noncardiac in nature.  No indication for ischemic evaluation.  Adherence stressed by cardiology and myself.  Discharge home.  Outpatient follow-up with cardiology. ?  ?AKI on CKD 4, likely prerenal in the setting of diuretics use. ?Baseline creatinine appears to be 2.6 with GFR 25 ?Presented with creatinine of 3.61 with GFR 50 ?Volume status euvolemic to hypovolemic ?Plan: ?Discharge home.  Creatinine proving at time of discharge.  Decrease home Lasix dose from 40 mg twice daily to 40 mg daily.  Hold home chlorthalidone.  Follow-up outpatient PCP and heart failure clinic. ?  ? ?Discharge Instructions ? ?Discharge Instructions   ? ? Diet - low sodium heart healthy   Complete by: As directed ?  ? Increase activity slowly   Complete by: As directed ?  ? ?  ? ?Allergies as of 09/18/2021   ? ?   Reactions  ? Penicillins Rash  ? ?  ? ?  ?Medication List  ?  ? ?STOP taking these medications   ? ?chlorthalidone 25 MG tablet ?Commonly known as: HYGROTON ?  ? ?  ? ?TAKE these medications   ? ?amLODipine 10 MG tablet ?Commonly known as:  Webb ?Take 1 tablet (10 mg total) by mouth daily. ?  ?apixaban 2.5 MG Tabs tablet ?Commonly known as: ELIQUIS ?Take 1 tablet (2.5 mg total) by mouth 2 (two) times daily. ?  ?Breztri Aerosphere 160-9-4.8 MCG/ACT Aero ?Generic drug: Budeson-Glycopyrrol-Formoterol ?Inhale 2 puffs into the lungs in the morning and at bedtime. ?  ?budesonide 0.25 MG/2ML nebulizer  solution ?Commonly known as: PULMICORT ?Take 2 mLs (0.25 mg total) by nebulization daily for 7 days. ?  ?carvedilol 12.5 MG tablet ?Commonly known as: Coreg ?Take 1 tablet (12.5 mg total) by mouth 2 (two) times daily. ?  ?cetirizine 10 MG tablet ?Commonly known as: ZYRTEC ?Take 10 mg by mouth daily. ?  ?cyanocobalamin 1000 MCG tablet ?Take 1 tablet (1,000 mcg total) by mouth daily. ?  ?furosemide 40 MG tablet ?Commonly known as: LASIX ?Take 1 tablet (40 mg total) by mouth daily. ?What changed: when to take this ?  ?gabapentin 100 MG capsule ?Commonly known as: NEURONTIN ?Take 1 capsule (100 mg total) by mouth in the morning, at noon, and at bedtime. ?  ?glimepiride 2 MG tablet ?Commonly known as: AMARYL ?Take 1 tablet (2 mg total) by mouth in the morning and at bedtime. Take one tab po qd with supper ?  ?hydrALAZINE 10 MG tablet ?Commonly known as: APRESOLINE ?Take 1 tablet (10 mg total) by mouth 3 (three) times daily. ?  ?insulin glargine (1 Unit Dial) 300 UNIT/ML Solostar Pen ?Commonly known as: TOUJEO ?Inject 20 Units into the skin daily. Reduced from prior 26 units daily. ?  ?ipratropium-albuterol 0.5-2.5 (3) MG/3ML Soln ?Commonly known as: DUONEB ?Take 3 mLs by nebulization every 6 (six) hours as needed. ?  ?lidocaine 5 % ?Commonly known as: LIDODERM ?Place 1 patch onto the skin daily. Remove & Discard patch within 12 hours or as directed by MD ?Start taking on: September 19, 2021 ?  ?Novofine Pen Needle 32G X 6 MM Misc ?Generic drug: Insulin Pen Needle ?Use as directed ?  ?omeprazole 40 MG capsule ?Commonly known as: PRILOSEC ?Take 1 capsule (40 mg total) by mouth in the morning and at bedtime. ?  ?potassium chloride SA 20 MEQ tablet ?Commonly known as: KLOR-CON M ?Take 1 tablet (20 mEq total) by mouth 2 (two) times daily. ?  ?rosuvastatin 10 MG tablet ?Commonly known as: CRESTOR ?Take 1 tablet (10 mg total) by mouth at bedtime. ?  ?True Metrix Air Glucose Meter Devi ?1 Device by Does not apply route 2 (two) times  daily. ?  ?True Metrix Blood Glucose Test test strip ?Generic drug: glucose blood ?Use as instructed twice daily diag E11.65 ?  ?Accu-Chek Guide test strip ?Generic drug: glucose blood ?Use to check blood sugars 4 times a day   E11.65 ?  ?True Metrix Level 1 Low Soln ?Use as directed ?  ?TRUEplus Lancets 33G Misc ?Use as directed twice daily diag E11.65 ?  ?Accu-Chek Softclix Lancets lancets ?Use to check blood sugars 4 times a day   E11.65 ?  ? ?  ? ? ?Allergies  ?Allergen Reactions  ? Penicillins Rash  ? ? ?Consultations: ?Cardiology ? ? ?Procedures/Studies: ?DG Chest 2 View ? ?Result Date: 09/16/2021 ?CLINICAL DATA:  Chest pain and shortness of breath. EXAM: CHEST - 2 VIEW COMPARISON:  08/07/2021 FINDINGS: Stable top-normal heart size. Stable to slightly smaller left pleural effusion of overall small volume. Associated left lower lobe atelectasis. Right pleural effusion has resolved since the prior x-ray. Probable mild pulmonary interstitial edema. No pneumothorax. IMPRESSION: Mild pulmonary interstitial  edema. Small left pleural effusion remains which appears slightly smaller compared to the prior chest x-ray. Right pleural effusion has resolved since the prior chest x-ray. Electronically Signed   By: Aletta Edouard M.D.   On: 09/16/2021 17:53   ? ? ? ?Subjective: ?Seen and examined on the day of discharge.  Stable no distress.  Chest pain resolved.  Stable for discharge home. ? ?Discharge Exam: ?Vitals:  ? 09/18/21 0728 09/18/21 1121  ?BP: 122/75 111/69  ?Pulse: 69 68  ?Resp: 20 18  ?Temp: 97.7 ?F (36.5 ?C)   ?SpO2: 98% 98%  ? ?Vitals:  ? 09/18/21 0341 09/18/21 0423 09/18/21 0728 09/18/21 1121  ?BP: (!) 108/57  122/75 111/69  ?Pulse: 66  69 68  ?Resp: '19  20 18  '$ ?Temp: (!) 97.4 ?F (36.3 ?C)  97.7 ?F (36.5 ?C)   ?TempSrc: Axillary     ?SpO2: 95%  98% 98%  ?Weight:  120.6 kg    ?Height:      ? ? ?General: Pt is alert, awake, not in acute distress ?Cardiovascular: RRR, S1/S2 +, no rubs, no gallops ?Respiratory: CTA  bilaterally, no wheezing, no rhonchi ?Abdominal: Soft, NT, ND, bowel sounds + ?Extremities: no edema, no cyanosis ? ? ? ?The results of significant diagnostics from this hospitalization (including imaging, microbiology,

## 2021-09-18 NOTE — TOC Initial Note (Signed)
Transition of Care (TOC) - Initial/Assessment Note  ? ? ?Patient Details  ?Name: Larry Callahan ?MRN: [id number] ?Date of Birth: 30-Dec-1955 ? ?Transition of Care (TOC) CM/SW Contact:    ?Kayven Aldaco E Roselani Grajeda, LCSW ?Phone Number: ?09/18/2021, 9:43 AM ? ?Clinical Narrative:      Patient to be DC today, appropriate for taxi per MD.  ?Spoke to patient who stated he still lives alone and uses [facility] transportation. ?PCP is Dr. Humphrey Rolls. Pharmacy is [facility]. Patient has a cane and cpap machine.  ?Patient's sister lives 3 houses down and is supportive.  ?Patient is active with [facility] (confirmed with Representative Merleen Nicely and notified her patient is DC today). Patient is agreeable to PT and RN, but not Aide. Asked MD for orders.  ?Patient stated he has no one to pick him up and cannot afford a taxi. Address confirmed with patient. Taxi voucher provided, RN to call when patient is ready for transport.  ? ?Expected Discharge Plan: [place] ?Barriers to Discharge: Barriers Resolved ? ? ?Patient Goals and CMS Choice ?Patient states their goals for this hospitalization and ongoing recovery are:: home with home health ?CMS Medicare.gov Compare Post Acute Care list provided to:: Patient ?Choice offered to / list presented to : Patient ? ?Expected Discharge Plan and Services ?Expected Discharge Plan: [place] ?  ?  ?  ?Living arrangements for the past 2 months: [place] ?Expected Discharge Date: 09/18/21               ?  ?  ?  ?  ?  ?HH Arranged: RN, PT ?[place] Agency: [facility] ?Date HH Agency Contacted: 09/18/21 ?  ?Representative spoke with at [place]: Merleen Nicely ? ?Prior Living Arrangements/Services ?Living arrangements for the past 2 months: [place] ?Lives with:: Self ?Patient language and need for interpreter reviewed:: Yes ?Do you feel safe going back to the place where you live?: Yes      ?Need for Family Participation in Patient Care: Yes (Comment) ?Care giver  support system in place?: Yes (comment) ?Current home services: DME, Home PT ?Criminal Activity/Legal Involvement Pertinent to Current Situation/Hospitalization: No - Comment as needed ? ?Activities of Daily Living ?Home Assistive Devices/Equipment: CPAP ?ADL Screening (condition at time of admission) ?Patient's cognitive ability adequate to safely complete daily activities?: Yes ?Is the patient deaf or have difficulty hearing?: No ?Does the patient have difficulty seeing, even when wearing glasses/contacts?: No ?Does the patient have difficulty concentrating, remembering, or making decisions?: No ?Patient able to express need for assistance with ADLs?: Yes ?Does the patient have difficulty dressing or bathing?: No ?Independently performs ADLs?: Yes (appropriate for developmental age) ?Does the patient have difficulty walking or climbing stairs?: Yes (climbing stairs) ?Weakness of Legs: None ?Weakness of Arms/Hands: None ? ?Permission Sought/Granted ?Permission sought to share information with : Customer service manager ?Permission granted to share information with : Yes, Verbal Permission Granted ?   ? Permission granted to share info w AGENCY: [facility] ?   ?   ? ?Emotional Assessment ?  ?  ?  ?Orientation: : Oriented to Self, Oriented to Place, Oriented to  Time, Oriented to Situation ?Alcohol / Substance Use: Not Applicable ?Psych Involvement: No (comment) ? ?Admission diagnosis:  Chest pain [R07.9] ?Patient Active Problem List  ? Diagnosis Date Noted  ? Acute on chronic systolic CHF (congestive heart failure) ([place]) 08/05/2021  ? Acute CHF (congestive heart failure) ([place]) 08/05/2021  ?  COPD exacerbation ([place]) 06/28/2021  ? Acquired thrombophilia ([place]) 06/28/2021  ? Anemia in chronic kidney disease (CKD) 06/28/2021  ? Chest pain 06/28/2021  ? PAD (peripheral artery disease) ([place])   ? Cellulitis of right arm 03/02/2021  ? Chronic anticoagulation 03/02/2021  ? COPD with acute exacerbation ([place]) 01/29/2021  ?  Atrial flutter ([place]) 01/07/2021  ? Syncope and collapse 11/16/2020  ? Demand ischemia ([place])   ? Influenza A 10/30/2020  ? HLD (hyperlipidemia) 10/06/2020  ? History of CVA (cerebrovascular accident) 09/25/2020  ? Gastroesophageal reflux disease without esophagitis 04/19/2020  ? Pneumonia due to COVID-19 virus 01/30/2020  ? Elevated troponin I level 01/29/2020  ? Suspected COVID-19 virus infection 01/29/2020  ? Lightheadedness 01/29/2020  ? Hematemesis 01/09/2020  ? Hospital discharge follow-up 11/26/2019  ? Generalized weakness 08/08/2019  ? Obesity (BMI 30-39.9) 08/08/2019  ? Acute right hip pain 06/17/2019  ? Inability to ambulate due to hip 06/17/2019  ? CKD (chronic kidney disease), stage IV ([place])   ? Hypervolemia   ? Type 2 diabetes mellitus with stage 3b chronic kidney disease, with long-term current use of insulin ([place])   ? Hypomagnesemia   ? Full code status 05/20/2019  ? Pleuritic chest pain   ? Multifocal pneumonia 05/19/2019  ? Acute kidney injury superimposed on CKD 3b ([place]) 05/09/2019  ? Hemoptysis 04/09/2019  ? Shortness of breath 03/26/2019  ? Uncontrolled type 2 diabetes mellitus with insulin therapy 03/12/2019  ? Acute on chronic heart failure with preserved ejection fraction (HFpEF) ([place])   ? Stage 3b chronic kidney disease ([place])   ? Chronic systolic CHF (congestive heart failure) ([place]) 03/01/2019  ? Diastolic dysfunction 88/41/6606  ? Iron deficiency anemia 12/31/2018  ? Atopic dermatitis 11/24/2018  ? Screening for colon cancer 11/06/2017  ? Uncontrolled type 2 diabetes mellitus with hyperglycemia ([place]) 11/06/2017  ? Hidradenitis 06/09/2017  ? Atherosclerotic heart disease of native coronary artery without angina pectoris 06/09/2017  ? 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 Obstructive sleep apnea, adult 06/09/2017  ? Morbid (severe) obesity due to excess calories ([place]) 06/09/2017  ? Cigarette nicotine dependence with nicotine-induced disorder 06/09/2017  ?  Diabetic polyneuropathy associated with type 2 diabetes mellitus ([place]) 06/09/2017  ? Allergic rhinitis due to pollen 06/09/2017  ? Mixed hyperlipidemia 06/09/2017  ? COPD (chronic obstructive pulmonary disease) ([place]) 06/09/2017  ? Dyspnea on exertion 06/09/2017  ? Wheezing 06/09/2017  ? Dysuria 06/09/2017  ? Snoring 06/09/2017  ? Essential hypertension 06/09/2017  ? Personal history of other malignant neoplasm of kidney 06/09/2017  ? Pain in right hip 06/09/2017  ? Impacted cerumen, bilateral 06/09/2017  ? Secondary hyperparathyroidism, not elsewhere classified ([place]) 06/09/2017  ? Type 2 diabetes mellitus with hyperglycemia ([place]) 06/09/2017  ? Tinea corporis 06/09/2017  ? ?PCP:  [name], NP ?Pharmacy:   ?[facility], [place] - [address] ?[address] ?[place] [address] ?Phone: [phone] Fax: [phone] ? ? ? ? ?Social Determinants of Health (SDOH) Interventions ?  ? ?Readmission Risk Interventions ? ?  08/07/2021  ?  2:36 PM 06/28/2021  ?  2:26 PM 03/05/2021  ?  2:03 PM  ?Readmission Risk Prevention Plan  ?Transportation Screening Complete Complete Complete  ?Medication Review Press photographer) Complete Complete Complete  ?PCP or Specialist appointment within 3-5 days of discharge Complete Complete Complete  ?[place] or Home Care Consult Complete Complete Complete  ?SW Recovery Care/Counseling Consult Complete Complete   ?Palliative Care  Screening Complete Not Applicable Not Applicable  ?Skilled Nursing Facility Complete Not Applicable Not Applicable  ? ? ? ?

## 2021-09-20 ENCOUNTER — Telehealth: Payer: Self-pay

## 2021-09-20 NOTE — Telephone Encounter (Signed)
Unable to reach patient for HF visit. Emergency contact #s not working-[name] ?

## 2021-09-20 NOTE — Progress Notes (Deleted)
?   Patient ID: Larry Callahan, male    DOB: 1956-01-04, 66 y.o.   MRN: [id number] ? ?HPI ? ?Larry Callahan is a 66 yo male with a PMHx of nonischemic cardiomyopathy last EF measured in April 2022 was 53 to 40%, COPD, sleep apnea, chronic kidney disease stage IV, chronic anemia, hypertension, and diabetes mellitus. ? ?Echo from 08/08/21 reviewed and showed and EF of 40-45%. There is mild to moderately decreased function of the left ventricle. The diastolic parameters are indeterminate. The RV systolic function is low normal.  ? ?RHC done 03/29/2019 and showed: ?Pulmonary capillary wedge pressure mean of 8 ?PA: 32/16 mean 23 ?RA mean of 10 ?RV 26/12/13  ? ?Cardiac output 8.17 ?Cardiac index 3.42 ? ?Final Conclusions:   ?Normal right heart pressures ? ?Admitted 09/16/21 due to sudden chest pain. Cardiology consult obtained. NSTEMI ruled out. Furosemide decreased to daily with stoppage of chlorthalidone. Discharged in 2 days. Admitted 2/23-2/26/23 with worsening SOB, and orthopnea, diuresed 4.9 liters and ultimately d/c'd home with [facility] RN services.  ? ?He presents today to the heart failure clinic for a follow-up visit with a chief complaint of  ? ?Review of Systems ? ? ? ?Physical Exam ? ? Assessment and Plan: ? ?Heart failure with reduced ejection fraction ?- NYHA II ?- euvolemic today ?- has scales but has not been weighing, needs new batteries; instructed to get new batteries and call for an overnight weight gain of > 2 pounds or a weekly weight gain of > 5 pounds ?- weight 251.9 from last visit here 1 month ago ?- [facility] RN has been visiting him and weighs him when she comes ?- discussed low sodium diet, he advised that he adheres and eats fresh fruit and vegetables ?- discussed total fluid intake ~ 64 ounces/day; he voices concerns about restricting as he was advised to increase during periods of hyperglycemia ?- he would greatly benefit from the paramedicine program, he is agreeable to this, brochure provided ?-  previously on lisinopril but d/c'd d/t CKD ?- saw cardiology [facility]u) 11/11/20 ?- on GDMT coreg ?- BNP 09/16/21 was 108.6 ? ?2. HTN with CKD ?- BP ?- saw PCP ([place]) 08/11/21 ?- BMP 09/18/21 reviewed and showed Na 130, K 3.2, Cr 3.13, gfr 21 ? ? ?3. DM  ?- A1C 7.0% 08/06/21 ?- BS  ? ?4: Afib ?- RRR noted today  ?- eliquis ? ?5. OSA ?- wears CPAP with O2 HS, for "all hours I sleep, including naps ? ? ?

## 2021-09-21 ENCOUNTER — Other Ambulatory Visit: Payer: Self-pay

## 2021-09-21 ENCOUNTER — Encounter: Payer: Self-pay | Admitting: Emergency Medicine

## 2021-09-21 ENCOUNTER — Emergency Department
Admission: EM | Admit: 2021-09-21 | Discharge: 2021-09-21 | Disposition: A | Discharge status: Home / Self Care | Payer: Medicare PPO | Attending: Emergency Medicine | Admitting: Emergency Medicine

## 2021-09-21 ENCOUNTER — Ambulatory Visit: Payer: Medicare PPO | Admitting: Family

## 2021-09-21 DIAGNOSIS — S50811A Abrasion of right forearm, initial encounter: Secondary | ICD-10-CM | POA: Diagnosis not present

## 2021-09-21 DIAGNOSIS — I10 Essential (primary) hypertension: Secondary | ICD-10-CM | POA: Insufficient documentation

## 2021-09-21 DIAGNOSIS — S61412A Laceration without foreign body of left hand, initial encounter: Secondary | ICD-10-CM

## 2021-09-21 DIAGNOSIS — S50812A Abrasion of left forearm, initial encounter: Secondary | ICD-10-CM | POA: Diagnosis not present

## 2021-09-21 DIAGNOSIS — Z7901 Long term (current) use of anticoagulants: Secondary | ICD-10-CM | POA: Diagnosis not present

## 2021-09-21 DIAGNOSIS — R5381 Other malaise: Secondary | ICD-10-CM | POA: Diagnosis not present

## 2021-09-21 DIAGNOSIS — E119 Type 2 diabetes mellitus without complications: Secondary | ICD-10-CM | POA: Insufficient documentation

## 2021-09-21 DIAGNOSIS — T07XXXA Unspecified multiple injuries, initial encounter: Secondary | ICD-10-CM

## 2021-09-21 DIAGNOSIS — W260XXA Contact with knife, initial encounter: Secondary | ICD-10-CM | POA: Insufficient documentation

## 2021-09-21 DIAGNOSIS — S6992XA Unspecified injury of left wrist, hand and finger(s), initial encounter: Secondary | ICD-10-CM | POA: Diagnosis present

## 2021-09-21 DIAGNOSIS — S61409A Unspecified open wound of unspecified hand, initial encounter: Secondary | ICD-10-CM | POA: Diagnosis not present

## 2021-09-21 DIAGNOSIS — Y93G9 Activity, other involving cooking and grilling: Secondary | ICD-10-CM | POA: Diagnosis not present

## 2021-09-21 DIAGNOSIS — S60512A Abrasion of left hand, initial encounter: Secondary | ICD-10-CM | POA: Diagnosis not present

## 2021-09-21 DIAGNOSIS — J449 Chronic obstructive pulmonary disease, unspecified: Secondary | ICD-10-CM | POA: Insufficient documentation

## 2021-09-21 DIAGNOSIS — J168 Pneumonia due to other specified infectious organisms: Secondary | ICD-10-CM | POA: Diagnosis not present

## 2021-09-21 DIAGNOSIS — I4891 Unspecified atrial fibrillation: Secondary | ICD-10-CM | POA: Insufficient documentation

## 2021-09-21 DIAGNOSIS — R0789 Other chest pain: Secondary | ICD-10-CM | POA: Diagnosis not present

## 2021-09-21 MED ORDER — LIDOCAINE HCL (PF) 1 % IJ SOLN
5.0000 mL | Freq: Once | INTRAMUSCULAR | Status: AC
  Administered 2021-09-21: 5 mL via INTRADERMAL
  Filled 2021-09-21: qty 5

## 2021-09-21 MED ORDER — CEPHALEXIN 500 MG PO CAPS: 500.0000 mg | ORAL_CAPSULE | Freq: Three times a day (TID) | ORAL | 0 refills | Status: AC

## 2021-09-21 MED ORDER — BACITRACIN 500 UNIT/GM EX OINT: 1.0000 "application " | TOPICAL_OINTMENT | Freq: Two times a day (BID) | CUTANEOUS | 0 refills | Status: DC

## 2021-09-21 NOTE — ED Notes (Signed)
Spoke with [place], patients sister and she reports she has no way to get him and his nephew cannot pick him up today ?

## 2021-09-21 NOTE — Discharge Instructions (Addendum)
Do not get the sutured area wet for 24 hours. After 24 hours, shower/bathe as usual and pat the area dry. °Change the bandage 2 times per day and apply antibiotic ointment. °Leave open to air when at no risk of getting the area dirty, but cover at night before bed. °See your PCP or go to Urgent Care in 10 days for suture removal or sooner for signs or concern of infection. ° °

## 2021-09-21 NOTE — ED Provider Notes (Signed)
? ?[facility] ?Provider Note ? ? ? Event Date/Time  ? First MD Initiated Contact with Patient 09/21/21 1032   ?  (approximate) ? ? ?History  ? ?Laceration ? ? ?HPI ? ?Cap Massi Misch is a 66 y.o. male with significant past medical history of diabetes, hypertension, COPD, A-fib/flutter on Eliquis and as listed in EMR presents to the emergency department for evaluation of laceration to the dorsal aspect of the left hand.  Patient states that he was using a knife to slice cheese this morning and cut his hand.  He also has scratches over both forearms after working outside clearing some brush yesterday.  Tdap is current.  Bleeding to the hand laceration is well controlled. ? ?  ? ? ?Physical Exam  ? ?Triage Vital Signs: ?ED Triage Vitals  ?Enc Vitals Group  ?   BP 09/21/21 1030 112/78  ?   Pulse Rate 09/21/21 1030 78  ?   Resp 09/21/21 1030 18  ?   Temp 09/21/21 1030 98 ?F (36.7 ?C)  ?   Temp Source 09/21/21 1030 Oral  ?   SpO2 09/21/21 1030 98 %  ?   Weight 09/21/21 1018 265 lb 14 oz (120.6 kg)  ?   Height 09/21/21 1018 '5\' 11"'$  (1.803 m)  ?   Head Circumference --   ?   Peak Flow --   ?   Pain Score 09/21/21 1029 3  ?   Pain Loc --   ?   Pain Edu? --   ?   Excl. in [place]? --   ? ? ?Most recent vital signs: ?Vitals:  ? 09/21/21 1030 09/21/21 1233  ?BP: 112/78 118/76  ?Pulse: 78 72  ?Resp: 18 16  ?Temp: 98 ?F (36.7 ?C)   ?SpO2: 98% 98%  ? ? ?General: Awake, no distress.  ?CV:  Good peripheral perfusion.  ?Resp:  Normal effort.  ?Abd:  No distention.  ?Other:  2 cm laceration to the dorsal aspect of the left hand. ? ? ?ED Results / Procedures / Treatments  ? ?Labs ?(all labs ordered are listed, but only abnormal results are displayed) ?Labs Reviewed - No data to display ? ? ?EKG ? ?Not indicated. ? ? ?RADIOLOGY ? ?Image and radiology report reviewed by me. ? ?Not indicated. ? ?PROCEDURES: ? ?Critical Care performed: No ? ?Marland Kitchen.Laceration Repair ? ?Date/Time: 09/21/2021 3:46 PM ?Performed by:  Victorino Dike, FNP ?Authorized by: Victorino Dike, FNP  ? ?Consent:  ?  Consent obtained:  Verbal ?  Consent given by:  Patient ?  Risks discussed:  Infection, poor cosmetic result and poor wound healing ?Universal protocol:  ?  Patient identity confirmed:  Verbally with patient ?Anesthesia:  ?  Anesthesia method:  Local infiltration ?  Local anesthetic:  Lidocaine 1% w/o epi ?Laceration details:  ?  Location:  Hand ?  Hand location:  L hand, dorsum ?  Length (cm):  2 ?Pre-procedure details:  ?  Preparation:  Patient was prepped and draped in usual sterile fashion ?Exploration:  ?  Imaging outcome: foreign body not noted   ?Treatment:  ?  Area cleansed with:  Povidone-iodine and saline ?  Irrigation method:  Syringe ?Skin repair:  ?  Repair method:  Sutures ?  Suture size:  4-0 ?  Suture material:  Nylon ?  Suture technique:  Simple interrupted ?  Number of sutures:  3 ?Approximation:  ?  Approximation:  Close ?Repair type:  ?  Repair type:  Simple ?Post-procedure details:  ?  Dressing:  Non-adherent dressing and sterile dressing ?  Procedure completion:  Tolerated well, no immediate complications ? ? ?MEDICATIONS ORDERED IN ED: ?Medications  ?lidocaine (PF) (XYLOCAINE) 1 % injection 5 mL (5 mLs Intradermal Given by Other 09/21/21 1138)  ? ? ? ?IMPRESSION / MDM / ASSESSMENT AND PLAN / ED COURSE  ? ?I have reviewed the triage note. ? ?Differential diagnosis includes, but is not limited to, laceration ? ?66 year old male presenting to the emergency department for treatment and evaluation of laceration to the left hand that he sustained with a knife at home this morning.  See HPI for further details.  ? ?Wound closed with sutures as above. Wound care discussed. He is to have the sutures removed at PCP office in 10 days. He will be placed on antibiotics as well as he is high risk for infection. Bacitracin will be sent for him to apply over the forearms. ER return precautions given. ? ?  ? ? ?FINAL CLINICAL  IMPRESSION(S) / ED DIAGNOSES  ? ?Final diagnoses:  ?Laceration of left hand without foreign body, initial encounter  ?Abrasions of multiple sites  ? ? ? ?Rx / DC Orders  ? ?ED Discharge Orders   ? ?      Ordered  ?  cephALEXin (KEFLEX) 500 MG capsule  3 times daily       ? 09/21/21 1219  ?  bacitracin 500 UNIT/GM ointment  2 times daily       ? 09/21/21 1219  ? ?  ?  ? ?  ? ? ? ?Note:  This document was prepared using Dragon voice recognition software and may include unintentional dictation errors. ?  ?Victorino Dike, FNP ?09/21/21 1552 ? ?  ?Vanessa [place], MD ?09/22/21 1911 ? ?

## 2021-09-21 NOTE — ED Triage Notes (Signed)
Presents via EMS with laceration to left hand  states he cut it last pm on a barb wire fence  ?

## 2021-09-22 ENCOUNTER — Telehealth: Payer: Self-pay

## 2021-09-22 NOTE — Telephone Encounter (Signed)
Larry Callahan [id number] from [facility] called and requested verbal orders to restart home health visits twice a week for 3 weeks, and she will also have PT eval started.  Pt was in the hospital last week on 09/16/21 for chest pain and also went to the ER on 09/21/21 for a laceration to his left hand that he required stitches for.  Pt cut hand peeling something.  Pt has hosp f/u visit on 09/29/21 at 10 am..  I gave the verbal ok to restart both orders. ?

## 2021-09-23 ENCOUNTER — Telehealth: Payer: Self-pay

## 2021-09-23 ENCOUNTER — Other Ambulatory Visit: Payer: Self-pay

## 2021-09-23 ENCOUNTER — Encounter: Payer: Self-pay | Admitting: Emergency Medicine

## 2021-09-23 ENCOUNTER — Emergency Department: Payer: Medicare PPO

## 2021-09-23 ENCOUNTER — Observation Stay
Admission: EM | Admit: 2021-09-23 | Discharge: 2021-09-25 | Disposition: A | Discharge status: Home Health | Payer: Medicare PPO | Attending: Family Medicine | Admitting: Family Medicine

## 2021-09-23 DIAGNOSIS — Z87891 Personal history of nicotine dependence: Secondary | ICD-10-CM | POA: Insufficient documentation

## 2021-09-23 DIAGNOSIS — Z7902 Long term (current) use of antithrombotics/antiplatelets: Secondary | ICD-10-CM | POA: Diagnosis not present

## 2021-09-23 DIAGNOSIS — I5042 Chronic combined systolic (congestive) and diastolic (congestive) heart failure: Secondary | ICD-10-CM | POA: Insufficient documentation

## 2021-09-23 DIAGNOSIS — Z20822 Contact with and (suspected) exposure to covid-19: Secondary | ICD-10-CM | POA: Insufficient documentation

## 2021-09-23 DIAGNOSIS — I4892 Unspecified atrial flutter: Secondary | ICD-10-CM | POA: Diagnosis present

## 2021-09-23 DIAGNOSIS — J189 Pneumonia, unspecified organism: Principal | ICD-10-CM | POA: Insufficient documentation

## 2021-09-23 DIAGNOSIS — I130 Hypertensive heart and chronic kidney disease with heart failure and stage 1 through stage 4 chronic kidney disease, or unspecified chronic kidney disease: Secondary | ICD-10-CM | POA: Insufficient documentation

## 2021-09-23 DIAGNOSIS — I2510 Atherosclerotic heart disease of native coronary artery without angina pectoris: Secondary | ICD-10-CM | POA: Diagnosis not present

## 2021-09-23 DIAGNOSIS — R531 Weakness: Secondary | ICD-10-CM | POA: Diagnosis not present

## 2021-09-23 DIAGNOSIS — Z79899 Other long term (current) drug therapy: Secondary | ICD-10-CM | POA: Insufficient documentation

## 2021-09-23 DIAGNOSIS — J449 Chronic obstructive pulmonary disease, unspecified: Secondary | ICD-10-CM | POA: Insufficient documentation

## 2021-09-23 DIAGNOSIS — N179 Acute kidney failure, unspecified: Secondary | ICD-10-CM | POA: Diagnosis not present

## 2021-09-23 DIAGNOSIS — Z7901 Long term (current) use of anticoagulants: Secondary | ICD-10-CM | POA: Diagnosis not present

## 2021-09-23 DIAGNOSIS — R079 Chest pain, unspecified: Secondary | ICD-10-CM | POA: Diagnosis not present

## 2021-09-23 DIAGNOSIS — R0602 Shortness of breath: Secondary | ICD-10-CM | POA: Diagnosis not present

## 2021-09-23 DIAGNOSIS — Z794 Long term (current) use of insulin: Secondary | ICD-10-CM | POA: Diagnosis not present

## 2021-09-23 DIAGNOSIS — J90 Pleural effusion, not elsewhere classified: Secondary | ICD-10-CM | POA: Diagnosis not present

## 2021-09-23 DIAGNOSIS — J168 Pneumonia due to other specified infectious organisms: Secondary | ICD-10-CM | POA: Diagnosis not present

## 2021-09-23 DIAGNOSIS — G4733 Obstructive sleep apnea (adult) (pediatric): Secondary | ICD-10-CM | POA: Diagnosis present

## 2021-09-23 DIAGNOSIS — I10 Essential (primary) hypertension: Secondary | ICD-10-CM | POA: Diagnosis present

## 2021-09-23 DIAGNOSIS — I4891 Unspecified atrial fibrillation: Secondary | ICD-10-CM | POA: Diagnosis not present

## 2021-09-23 DIAGNOSIS — R0789 Other chest pain: Secondary | ICD-10-CM | POA: Diagnosis not present

## 2021-09-23 DIAGNOSIS — M7989 Other specified soft tissue disorders: Secondary | ICD-10-CM | POA: Diagnosis not present

## 2021-09-23 DIAGNOSIS — K219 Gastro-esophageal reflux disease without esophagitis: Secondary | ICD-10-CM | POA: Diagnosis present

## 2021-09-23 DIAGNOSIS — I5022 Chronic systolic (congestive) heart failure: Secondary | ICD-10-CM | POA: Diagnosis present

## 2021-09-23 DIAGNOSIS — N1832 Chronic kidney disease, stage 3b: Secondary | ICD-10-CM | POA: Diagnosis not present

## 2021-09-23 DIAGNOSIS — I517 Cardiomegaly: Secondary | ICD-10-CM | POA: Diagnosis not present

## 2021-09-23 DIAGNOSIS — E119 Type 2 diabetes mellitus without complications: Secondary | ICD-10-CM | POA: Diagnosis not present

## 2021-09-23 DIAGNOSIS — Z7984 Long term (current) use of oral hypoglycemic drugs: Secondary | ICD-10-CM | POA: Diagnosis not present

## 2021-09-23 DIAGNOSIS — Z8679 Personal history of other diseases of the circulatory system: Secondary | ICD-10-CM | POA: Insufficient documentation

## 2021-09-23 DIAGNOSIS — E1165 Type 2 diabetes mellitus with hyperglycemia: Secondary | ICD-10-CM | POA: Diagnosis present

## 2021-09-23 HISTORY — DX: Chronic kidney disease, stage 3b: N18.32

## 2021-09-23 HISTORY — DX: Unspecified atrial flutter: I48.92

## 2021-09-23 LAB — RESP PANEL BY RT-PCR (FLU A&B, COVID) ARPGX2
Influenza A by PCR: NEGATIVE
Influenza B by PCR: NEGATIVE
SARS Coronavirus 2 by RT PCR: NEGATIVE

## 2021-09-23 LAB — CBC
HCT: 33.5 % — ABNORMAL LOW (ref 39.0–52.0)
Hemoglobin: 10.3 g/dL — ABNORMAL LOW (ref 13.0–17.0)
MCH: 26.5 pg (ref 26.0–34.0)
MCHC: 30.7 g/dL (ref 30.0–36.0)
MCV: 86.1 fL (ref 80.0–100.0)
Platelets: 218 10*3/uL (ref 150–400)
RBC: 3.89 MIL/uL — ABNORMAL LOW (ref 4.22–5.81)
RDW: 17.7 % — ABNORMAL HIGH (ref 11.5–15.5)
WBC: 8.4 10*3/uL (ref 4.0–10.5)
nRBC: 0 % (ref 0.0–0.2)

## 2021-09-23 LAB — BASIC METABOLIC PANEL
Anion gap: 9 (ref 5–15)
BUN: 53 mg/dL — ABNORMAL HIGH (ref 8–23)
CO2: 23 mmol/L (ref 22–32)
Calcium: 8.6 mg/dL — ABNORMAL LOW (ref 8.9–10.3)
Chloride: 104 mmol/L (ref 98–111)
Creatinine, Ser: 2.62 mg/dL — ABNORMAL HIGH (ref 0.61–1.24)
GFR, Estimated: 26 mL/min — ABNORMAL LOW (ref >60)
Glucose, Bld: 140 mg/dL — ABNORMAL HIGH (ref 70–99)
Potassium: 4.3 mmol/L (ref 3.5–5.1)
Sodium: 136 mmol/L (ref 135–145)

## 2021-09-23 LAB — TROPONIN I (HIGH SENSITIVITY)
Troponin I (High Sensitivity): 13 ng/L (ref <18)
Troponin I (High Sensitivity): 13 ng/L (ref <18)

## 2021-09-23 LAB — BRAIN NATRIURETIC PEPTIDE: B Natriuretic Peptide: 137.5 pg/mL — ABNORMAL HIGH (ref 0.0–100.0)

## 2021-09-23 LAB — CBG MONITORING, ED: Glucose-Capillary: 157 mg/dL — ABNORMAL HIGH (ref 70–99)

## 2021-09-23 LAB — PROCALCITONIN: Procalcitonin: <0.1 ng/mL

## 2021-09-23 LAB — CK: Total CK: 82 U/L (ref 49–397)

## 2021-09-23 MED ORDER — AMLODIPINE BESYLATE 10 MG PO TABS
10.0000 mg | ORAL_TABLET | Freq: Every day | ORAL | Status: DC
  Administered 2021-09-24 – 2021-09-25 (×2): 10 mg via ORAL
  Filled 2021-09-23: qty 2
  Filled 2021-09-23: qty 1

## 2021-09-23 MED ORDER — ROSUVASTATIN CALCIUM 10 MG PO TABS
10.0000 mg | ORAL_TABLET | Freq: Every day | ORAL | Status: DC
  Administered 2021-09-23 – 2021-09-24 (×2): 10 mg via ORAL
  Filled 2021-09-23 (×3): qty 1

## 2021-09-23 MED ORDER — BUDESON-GLYCOPYRROL-FORMOTEROL 160-9-4.8 MCG/ACT IN AERO: 2.0000 | INHALATION_SPRAY | Freq: Two times a day (BID) | RESPIRATORY_TRACT | Status: DC

## 2021-09-23 MED ORDER — APIXABAN 2.5 MG PO TABS
2.5000 mg | ORAL_TABLET | Freq: Two times a day (BID) | ORAL | Status: DC
  Administered 2021-09-23 – 2021-09-25 (×4): 2.5 mg via ORAL
  Filled 2021-09-23 (×5): qty 1

## 2021-09-23 MED ORDER — INSULIN ASPART 100 UNIT/ML IJ SOLN
0.0000 [IU] | Freq: Three times a day (TID) | INTRAMUSCULAR | Status: DC
  Administered 2021-09-24: 3 [IU] via SUBCUTANEOUS
  Administered 2021-09-25: 2 [IU] via SUBCUTANEOUS
  Filled 2021-09-23 (×2): qty 1

## 2021-09-23 MED ORDER — HYDRALAZINE HCL 10 MG PO TABS
10.0000 mg | ORAL_TABLET | Freq: Three times a day (TID) | ORAL | Status: DC
  Administered 2021-09-23 – 2021-09-25 (×5): 10 mg via ORAL
  Filled 2021-09-23 (×6): qty 1

## 2021-09-23 MED ORDER — ACETAMINOPHEN 500 MG PO TABS
1000.0000 mg | ORAL_TABLET | Freq: Once | ORAL | Status: AC
  Administered 2021-09-23: 1000 mg via ORAL
  Filled 2021-09-23: qty 2

## 2021-09-23 MED ORDER — FUROSEMIDE 40 MG PO TABS
40.0000 mg | ORAL_TABLET | Freq: Every day | ORAL | Status: DC
  Administered 2021-09-24 – 2021-09-25 (×2): 40 mg via ORAL
  Filled 2021-09-23 (×2): qty 1

## 2021-09-23 MED ORDER — SODIUM CHLORIDE 0.9 % IV SOLN
1.0000 g | Freq: Once | INTRAVENOUS | Status: AC
  Administered 2021-09-23: 1 g via INTRAVENOUS
  Filled 2021-09-23: qty 10

## 2021-09-23 MED ORDER — PANTOPRAZOLE SODIUM 40 MG IV SOLR
40.0000 mg | Freq: Two times a day (BID) | INTRAVENOUS | Status: DC
  Administered 2021-09-23 – 2021-09-25 (×4): 40 mg via INTRAVENOUS
  Filled 2021-09-23 (×4): qty 10

## 2021-09-23 MED ORDER — BUDESONIDE 0.25 MG/2ML IN SUSP
0.2500 mg | Freq: Two times a day (BID) | RESPIRATORY_TRACT | Status: DC
  Administered 2021-09-24 – 2021-09-25 (×3): 0.25 mg via RESPIRATORY_TRACT
  Filled 2021-09-23 (×3): qty 2

## 2021-09-23 MED ORDER — CARVEDILOL 6.25 MG PO TABS
12.5000 mg | ORAL_TABLET | Freq: Two times a day (BID) | ORAL | Status: DC
  Administered 2021-09-23 – 2021-09-25 (×4): 12.5 mg via ORAL
  Filled 2021-09-23 (×4): qty 2

## 2021-09-23 MED ORDER — UMECLIDINIUM-VILANTEROL 62.5-25 MCG/ACT IN AEPB: 1.0000 | INHALATION_SPRAY | Freq: Every day | RESPIRATORY_TRACT | Status: DC

## 2021-09-23 MED ORDER — SODIUM CHLORIDE 0.9 % IV SOLN
500.0000 mg | Freq: Once | INTRAVENOUS | Status: AC
  Administered 2021-09-23: 500 mg via INTRAVENOUS
  Filled 2021-09-23: qty 5

## 2021-09-23 MED ORDER — IPRATROPIUM-ALBUTEROL 0.5-2.5 (3) MG/3ML IN SOLN
3.0000 mL | Freq: Four times a day (QID) | RESPIRATORY_TRACT | Status: DC | PRN
  Administered 2021-09-24: 3 mL via RESPIRATORY_TRACT

## 2021-09-23 NOTE — ED Provider Notes (Signed)
? ?[facility] ?Provider Note ? ? ? Event Date/Time  ? First MD Initiated Contact with Patient 09/23/21 1544   ?  (approximate) ? ? ?History  ? ?Chest Pain ? ? ?HPI ? ?Larry Callahan is a 66 y.o. male  with significant past medical history of d DM, HTN, CHF, GERD, nonischemic cardiomyopathy, COPD, A-fib/flutter on Eliquis and recent ED evaluation on 4/11 for a laceration to his left hand sustained from a knife he was using in his kitchen.  With sutures who presents for evaluation of some left-sided chest pain that he states started today.  Endorses some shortness of breath and is somewhat unclear how long this is going on or if it is different than baseline.  He states he is been coughing a little more than usual today.  He also states he has been quite sore around the site of his laceration of his left hand and has not changed his bandage or dressing or cleaned it.  He denies any new abdominal pain, vomiting, diarrhea or any other acute complaints.  He has scattered lacerations throughout his arms and legs which apparently are from working in some shrubs and bushes.  Denies any EtOH use illicit drug use or tobacco abuse. ? ?  ? ? ?Physical Exam  ?Triage Vital Signs: ?ED Triage Vitals  ?Enc Vitals Group  ?   BP   ?   Pulse   ?   Resp   ?   Temp   ?   Temp src   ?   SpO2   ?   Weight   ?   Height   ?   Head Circumference   ?   Peak Flow   ?   Pain Score   ?   Pain Loc   ?   Pain Edu?   ?   Excl. in [place]?   ? ? ?Most recent vital signs: ?Vitals:  ? 09/23/21 1550 09/23/21 1904  ?BP: (!) 146/86 125/74  ?Pulse: (!) 106 74  ?Resp: 14 20  ?Temp: 97.8 ?F (36.6 ?C)   ?SpO2: 98% 96%  ? ? ?General: Awake, no distress.  ?CV:  Good peripheral perfusion.  Slightly tachycardic. ?Resp:  Normal effort.  2+ radial pulses.  Slightly tachycardic. ?Abd:  No distention.  Soft. ?Other:  Left hand is fairly diffusely swollen.  Patient is able to flex and extend his digits.  2+ radial pulse.  Sensation is intact  in the distribution of the radial ulnar and median nerves.  Sutures are in place and wound appears clean dry and intact. ? ? ?ED Results / Procedures / Treatments  ?Labs ?(all labs ordered are listed, but only abnormal results are displayed) ?Labs Reviewed  ?BASIC METABOLIC PANEL - Abnormal; Notable for the following components:  ?    Result Value  ? Glucose, Bld 140 (*)   ? BUN 53 (*)   ? Creatinine, Ser 2.62 (*)   ? Calcium 8.6 (*)   ? GFR, Estimated 26 (*)   ? All other components within normal limits  ?CBC - Abnormal; Notable for the following components:  ? RBC 3.89 (*)   ? Hemoglobin 10.3 (*)   ? HCT 33.5 (*)   ? RDW 17.7 (*)   ? All other components within normal limits  ?BRAIN NATRIURETIC PEPTIDE - Abnormal; Notable for the following components:  ? B Natriuretic Peptide 137.5 (*)   ? All other components within normal limits  ?RESP PANEL BY  RT-PCR (FLU A&B, COVID) ARPGX2  ?CULTURE, BLOOD (ROUTINE X 2)  ?CULTURE, BLOOD (ROUTINE X 2)  ?TROPONIN I (HIGH SENSITIVITY)  ?TROPONIN I (HIGH SENSITIVITY)  ? ? ? ?EKG ? ?ECG is remarkable for a flutter with a rate of 104 with some nonspecific ST changes in inferior lateral leads without other clear evidence of acute ischemia.  Normal axis and unremarkable intervals. ? ?This is compared to ECG from 4/6 that showed similar T wave inversions in V5 and V6 as well as V4 as well as similar morphology in aVF and inferior leads. ?RADIOLOGY ? ?Chest x-ray my interpretation shows cardiomegaly and left-sided pleural effusion with some adjacent atelectasis without any overt edema, other focal consolidation, pneumothorax or other clear acute thoracic process.  I also reviewed radiologist interpretation. ? ?X-ray of the left hand on my interpretation shows some tissue swelling but no underlying bony abnormalities including evidence of osteomyelitis, retained foreign body.  I also reviewed radiologist interpretation. ? ?CT chest reviewed by myself shows some left lower lobe small  pleural effusion, appears to be consolidation versus atelectasis.  We do not see a pneumothorax or other clear acute thoracic process.  Also viewed radiology interpretation and agree with the findings of evidence of CAD and dilated central pulmonary arteries consistent with pulmonary artery hypertension as well as calcified granuloma on of the spleen. ? ? ?PROCEDURES: ? ?Critical Care performed: No ? ?.1-3 Lead EKG Interpretation ?Performed by: Lucrezia Starch, MD ?Authorized by: Lucrezia Starch, MD  ? ?  Interpretation: non-specific   ?  ECG rate assessment: tachycardic   ?  Rhythm: atrial flutter   ?  Ectopy: none   ?  Conduction: normal   ? ?The patient is on the cardiac monitor to evaluate for evidence of arrhythmia and/or significant heart rate changes. ? ? ?MEDICATIONS ORDERED IN ED: ?Medications  ?azithromycin (ZITHROMAX) 500 mg in sodium chloride 0.9 % 250 mL IVPB (has no administration in time range)  ?acetaminophen (TYLENOL) tablet 1,000 mg (1,000 mg Oral Given 09/23/21 1806)  ?cefTRIAXone (ROCEPHIN) 1 g in sodium chloride 0.9 % 100 mL IVPB (1 g Intravenous New Bag/Given 09/23/21 1808)  ? ? ? ?IMPRESSION / MDM / ASSESSMENT AND PLAN / ED COURSE  ?I reviewed the triage vital signs and the nursing notes. ?             ?               ? ?Differential diagnosis includes, but is not limited to ACS, pneumonia, bronchitis, symptomatic effusion, pneumothorax, PE, ? ?ECG is remarkable for a flutter with a rate of 104 with some nonspecific ST changes in inferior lateral leads without other clear evidence of acute ischemia.  Normal axis and unremarkable intervals. ? ?This is compared to ECG from 4/6 that showed similar T wave inversions in V5 and V6 as well as V4 as well as similar morphology in aVF and inferior leads.  Given nonelevated troponin obtained greater than 3 hours after symptom onset of low suspicion for ACS.  Repeat troponin is also negative. ? ?Chest x-ray my interpretation shows cardiomegaly and  left-sided pleural effusion with some adjacent atelectasis without any overt edema, other focal consolidation, pneumothorax or other clear acute thoracic process.  I also reviewed radiologist interpretation. ? ?X-ray of the left hand on my interpretation shows some tissue swelling but no underlying bony abnormalities including evidence of osteomyelitis, retained foreign body.  I also reviewed radiologist interpretation. ? ?CT chest reviewed by myself  shows some left lower lobe small pleural effusion, appears to be consolidation versus atelectasis.  We do not see a pneumothorax or other clear acute thoracic process.  Also viewed radiology interpretation and agree with the findings of evidence of CAD and dilated central pulmonary arteries consistent with pulmonary artery hypertension as well as calcified granuloma on of the spleen. ? ?CBC without leukocytosis or acute anemia.  Hemoglobin at 10.3 today as compared to 9.65 days ago.  BMP shows evidence of chronic CKD with improved kidney function with a creatinine of 2.62 compared to 5 days ago when it was 3.13.  No other significant lecture light or metabolic derangements.  BNP is 137 however overall patient does not appear to be in acute systolic heart failure on chest imaging on exam. ? ?I suspect consolidation and effusion are likely as any pneumonia.  Patient I do not think is septic at this time although will trial ambulation very unsteady and almost fell.  Given his age, sex, CHF history, CKD history and evidence of pleural effusion is PSI/port score is 37 I think inpatient observation for IV antibiotics is appropriate.  Discussed this with me hospitalist to place additional orders. ? ?  ? ? ?FINAL CLINICAL IMPRESSION(S) / ED DIAGNOSES  ? ?Final diagnoses:  ?Pneumonia of left lower lobe due to infectious organism  ?Pleural effusion  ? ? ? ?Rx / DC Orders  ? ?ED Discharge Orders   ? ? None  ? ?  ? ? ? ?Note:  This document was prepared using Dragon voice  recognition software and may include unintentional dictation errors. ?  ?Lucrezia Starch, MD ?09/23/21 2003 ? ?

## 2021-09-23 NOTE — ED Notes (Signed)
Pt given meal tray and drink.

## 2021-09-23 NOTE — ED Notes (Signed)
Pharmacy at bedside

## 2021-09-23 NOTE — ED Notes (Signed)
Pt ambulated around room, SpO2 maintained at 97%. Denies SOB, pt did express dizziness.  ?

## 2021-09-23 NOTE — Addendum Note (Signed)
Addended by: [name] on: 09/23/2021 02:12 PM ? ? Modules accepted: Orders ? ?

## 2021-09-23 NOTE — H&P (Signed)
?History and Physical  ? ? ?Patient: Larry Callahan VOH:[id number] DOB: November 25, 1955 ?DOA: 09/23/2021 ?DOS: the patient was seen and examined on 09/23/2021 ?PCP: [name], NP  ?Patient coming from: Home ? ?Chief Complaint:  ?Chief Complaint  ?Patient presents with  ? Chest Pain  ? ?HPI: Clent Damore is a 66 y.o. male with medical history significant of significant past medical history of d DM, HTN, CHF, GERD, nonischemic cardiomyopathy, COPD, A-fib/flutter on Eliquis and recent ED evaluation on 4/11 for a laceration to his left hand sustained from a knife  coming with generalized weakness and left sided chest pain. ? ?Pt coming for left sided chest pain. ?Chest pain on left side under his left breast.  ?Started today 11 am. ?Started at rest.  ?7/10 , sharp pain , intermittent,  worse with deep breathing.  ?No alleviating factor.  ?Pt cannot take blood thinner.  ?Pt is an extremely poor historian and goes in multiple tangents when asked questions. ?He has multiple chronic illness.  ?Pt quit smoking about 2 years ago.  ?Pt see cardiologist Dr.Gollan. ?He does not drive and is hard for him to get to appts. ?He came to ed 4/11 for a laceration from grating cheese. ? ? ?Review of Systems: Review of Systems  ?Reason unable to perform ROS: difficult historian due to hearing difficulty and multiple tangents.  ?Constitutional:  Positive for chills and malaise/fatigue.  ?Cardiovascular:  Positive for chest pain and leg swelling.  ?Neurological:  Positive for dizziness.  ? ?Past Medical History:  ?Diagnosis Date  ? Allergy   ? Chronic combined systolic (congestive) and diastolic (congestive) heart failure (HCC)   ? a. 04/2019 Echo: EF 45-50%; b. 02/2019 Echo: EF 55-60%; c. 09/2020 Echo: EF 35-40%, glob HK. Nl RV size/fxn. Mild MR.  ? CKD (chronic kidney disease), stage III ([place])   ? COPD (chronic obstructive pulmonary disease) ([place])   ? Coronary artery disease   ? a. 2011 Cath: nonobs dzs; b. 09/2020 MV: EF  38%. No signif ischemia. ? inf MI vs diaph attenuation. GI uptake artifact.  ? Diabetes mellitus without complication ([place])   ? GERD (gastroesophageal reflux disease)   ? Hyperlipidemia   ? Hypertension   ? Morbid obesity ([place])   ? NICM (nonischemic cardiomyopathy) ([place])   ? a. 04/2019 Echo: EF 45-50%; b. 02/2019 Echo: EF 55-60%; c. 09/2020 Echo: EF 35-40%, glob HK; d. 09/2020 MV: No ischemia. ? inf infarct vs attenuation.  ? OSA (obstructive sleep apnea)   ? Stage 3b chronic kidney disease ([place])   ? ?Past Surgical History:  ?Procedure Laterality Date  ? BACK SURGERY    ? CARDIAC CATHETERIZATION    ? CHOLECYSTECTOMY    ? COLONOSCOPY WITH PROPOFOL N/A 04/12/2019  ? Procedure: COLONOSCOPY WITH PROPOFOL;  Surgeon: [name], MD;  Location[facility] ENDOSCOPY;  Service: Gastroenterology;  Laterality: N/A;  ? ESOPHAGOGASTRODUODENOSCOPY N/A 04/12/2019  ? Procedure: ESOPHAGOGASTRODUODENOSCOPY (EGD);  Surgeon: [name], MD;  Location: [facility] ENDOSCOPY;  Service: Gastroenterology;  Laterality: N/A;  ? ESOPHAGOGASTRODUODENOSCOPY (EGD) WITH PROPOFOL N/A 11/21/2019  ? Procedure: ESOPHAGOGASTRODUODENOSCOPY (EGD) WITH PROPOFOL;  Surgeon: [name], MD;  Location: [facility] ENDOSCOPY;  Service: Gastroenterology;  Laterality: N/A;  ? FLEXIBLE BRONCHOSCOPY Bilateral 05/17/2019  ? Procedure: FLEXIBLE BRONCHOSCOPY;  Surgeon: [name], MD;  Location: [name] ORS;  Service: Pulmonary;  Laterality: Bilateral;  ? left arm surgery    ? nephrectomy Left   ? PARATHYROIDECTOMY    ? RIGHT HEART CATH N/A 03/29/2019  ?  Procedure: RIGHT HEART CATH;  Surgeon: [name], MD;  Location: [place] CV LAB;  Service: Cardiovascular;  Laterality: N/A;  ? ?Social History:  reports that he has quit smoking. His smoking use included cigarettes. He has quit using smokeless tobacco.  His smokeless tobacco use included chew. He reports that he does not drink alcohol and does not use drugs. ? ?Allergies  ?Allergen Reactions  ?  Penicillins Rash  ? ? ?Family History  ?Problem Relation Age of Onset  ? Diabetes Mother   ?     2003  ? Lung cancer Father   ? Diabetes Sister   ? Hypertension Sister   ? Heart disease Sister   ? Cancer Sister   ? Bone cancer Brother   ? ? ?Prior to Admission medications   ?Medication Sig Start Date End Date Taking? Authorizing Provider  ?Accu-Chek Softclix Lancets lancets Use to check blood sugars 4 times a day   E11.65 08/20/21   [name], NP  ?amLODipine (NORVASC) 10 MG tablet Take 1 tablet (10 mg total) by mouth daily. 08/11/21   [name], NP  ?apixaban (ELIQUIS) 2.5 MG TABS tablet Take 1 tablet (2.5 mg total) by mouth 2 (two) times daily. 05/20/21   [name], NP  ?bacitracin 500 UNIT/GM ointment Apply 1 application. topically 2 (two) times daily. 09/21/21   Victorino Dike, FNP  ?Blood Glucose Calibration (TRUE METRIX LEVEL 1) Low SOLN Use as directed 04/04/18   [name], NP  ?Blood Glucose Monitoring Suppl (TRUE METRIX AIR GLUCOSE METER) DEVI 1 Device by Does not apply route 2 (two) times daily. 04/13/18   [name], NP  ?Budeson-Glycopyrrol-Formoterol (BREZTRI AEROSPHERE) 160-9-4.8 MCG/ACT AERO Inhale 2 puffs into the lungs in the morning and at bedtime. 07/21/21   [name], NP  ?budesonide (PULMICORT) 0.25 MG/2ML nebulizer solution Take 2 mLs (0.25 mg total) by nebulization daily for 7 days. ?Patient not taking: Reported on 09/16/2021 08/08/21 09/16/21  Swayze, Ava, DO  ?carvedilol (COREG) 12.5 MG tablet Take 1 tablet (12.5 mg total) by mouth 2 (two) times daily. 01/07/21 01/07/22  Arta Silence, MD  ?cephALEXin (KEFLEX) 500 MG capsule Take 1 capsule (500 mg total) by mouth 3 (three) times daily for 10 days. 09/21/21 10/01/21  Victorino Dike, FNP  ?cetirizine (ZYRTEC) 10 MG tablet Take 10 mg by mouth daily.    [provider]  ?furosemide (LASIX) 40 MG tablet Take 1 tablet (40 mg total) by mouth daily. 09/18/21   Sidney Ace, MD  ?gabapentin  (NEURONTIN) 100 MG capsule Take 1 capsule (100 mg total) by mouth in the morning, at noon, and at bedtime. 08/11/21   [name], NP  ?glimepiride (AMARYL) 2 MG tablet Take 1 tablet (2 mg total) by mouth in the morning and at bedtime. Take one tab po qd with supper 09/09/21   Alisa Graff, FNP  ?glucose blood (ACCU-CHEK GUIDE) test strip Use to check blood sugars 4 times a day   E11.65 08/20/21   [name], NP  ?glucose blood (TRUE METRIX BLOOD GLUCOSE TEST) test strip Use as instructed twice daily diag E11.65 07/30/19   [name], NP  ?hydrALAZINE (APRESOLINE) 10 MG tablet Take 1 tablet (10 mg total) by mouth 3 (three) times daily. 09/09/21   Alisa Graff, FNP  ?insulin glargine, 1 Unit Dial, (TOUJEO) 300 UNIT/ML Solostar Pen Inject 20 Units into the skin daily. Reduced from prior 26 units daily. 08/08/21   Swayze, Ava,  DO  ?ipratropium-albuterol (DUONEB) 0.5-2.5 (3) MG/3ML SOLN Take 3 mLs by nebulization every 6 (six) hours as needed. 06/29/21   Danford, Suann Larry, MD  ?lidocaine (LIDODERM) 5 % Place 1 patch onto the skin daily. Remove & Discard patch within 12 hours or as directed by MD 09/19/21   Sidney Ace, MD  ?Emmit [place] PEN NEEDLE 32G X 6 MM MISC Use as directed 10/15/20   Lavera Guise, MD  ?omeprazole (PRILOSEC) 40 MG capsule Take 1 capsule (40 mg total) by mouth in the morning and at bedtime. 08/25/21   [name], NP  ?potassium chloride SA (KLOR-CON M) 20 MEQ tablet Take 1 tablet (20 mEq total) by mouth 2 (two) times daily. 09/10/21   Alisa Graff, FNP  ?rosuvastatin (CRESTOR) 10 MG tablet Take 1 tablet (10 mg total) by mouth at bedtime. 08/11/21   [name], NP  ?TRUEplus Lancets 33G MISC Use as directed twice daily diag E11.65 07/30/19   [name], NP  ?vitamin B-12 1000 MCG tablet Take 1 tablet (1,000 mcg total) by mouth daily. 01/16/21   Fritzi Mandes, MD  ? ? ?Physical Exam: ?Vitals:  ? 09/23/21 1554 09/23/21 1904 09/23/21 1930 09/23/21 2105  ?BP:   125/74 126/82 117/68  ?Pulse:  74 93 71  ?Resp:  20 (!) 21 12  ?Temp:      ?TempSrc:      ?SpO2:  96% 94% 96%  ?Weight: 113.4 kg     ?Height: _0  (1.803 m)     ?Physical Exam ?Vitals and nursing note reviewed.

## 2021-09-23 NOTE — ED Notes (Signed)
Pt sister, shelby, updated on pt plan of care.  ?

## 2021-09-23 NOTE — Telephone Encounter (Signed)
Pt called and stated that he was having pain in left side of chest and his hand that he has stitches in was swollen very big and that he had vomited some awful looking mess.  I advised pt that if he is not able to come to the office to be seen that he needs to call 911.  Pt was worried his sister would get upset with him and I told pt that his health was more important.  Pt did say he was going to call 911 and would go to the ER ?

## 2021-09-23 NOTE — ED Triage Notes (Signed)
Pt to ED via [facility] from home for L sided chest pain that started today. Denies abdominal pain but states that he "hasnt been eating". Pt has SOB while at rest.  ? ?Pt has hx of CHF, COPD. ? ?Pt is A&Ox4.  ?

## 2021-09-24 ENCOUNTER — Encounter: Payer: Self-pay | Admitting: Internal Medicine

## 2021-09-24 DIAGNOSIS — I483 Typical atrial flutter: Secondary | ICD-10-CM | POA: Diagnosis not present

## 2021-09-24 DIAGNOSIS — E1165 Type 2 diabetes mellitus with hyperglycemia: Secondary | ICD-10-CM | POA: Diagnosis not present

## 2021-09-24 DIAGNOSIS — R079 Chest pain, unspecified: Secondary | ICD-10-CM | POA: Diagnosis not present

## 2021-09-24 DIAGNOSIS — G4733 Obstructive sleep apnea (adult) (pediatric): Secondary | ICD-10-CM

## 2021-09-24 DIAGNOSIS — I5022 Chronic systolic (congestive) heart failure: Secondary | ICD-10-CM | POA: Diagnosis not present

## 2021-09-24 DIAGNOSIS — R531 Weakness: Secondary | ICD-10-CM | POA: Diagnosis not present

## 2021-09-24 DIAGNOSIS — N1832 Chronic kidney disease, stage 3b: Secondary | ICD-10-CM

## 2021-09-24 LAB — GLUCOSE, CAPILLARY
Glucose-Capillary: 197 mg/dL — ABNORMAL HIGH (ref 70–99)
Glucose-Capillary: 110 mg/dL — ABNORMAL HIGH (ref 70–99)
Glucose-Capillary: 195 mg/dL — ABNORMAL HIGH (ref 70–99)

## 2021-09-24 LAB — CBG MONITORING, ED
Glucose-Capillary: 106 mg/dL — ABNORMAL HIGH (ref 70–99)
Glucose-Capillary: 217 mg/dL — ABNORMAL HIGH (ref 70–99)

## 2021-09-24 IMAGING — CR DG CHEST 2V
1 series · 2 of 2 positions shown · non-contrast
Comparison: January 29, 2020

CLINICAL DATA: Shortness of breath and chest pain.

EXAM:
CHEST - 2 VIEW

[Series 1: dg chest 2 view · 0.14mm/px · 2 of 2 slices shown]
[im 1/2]
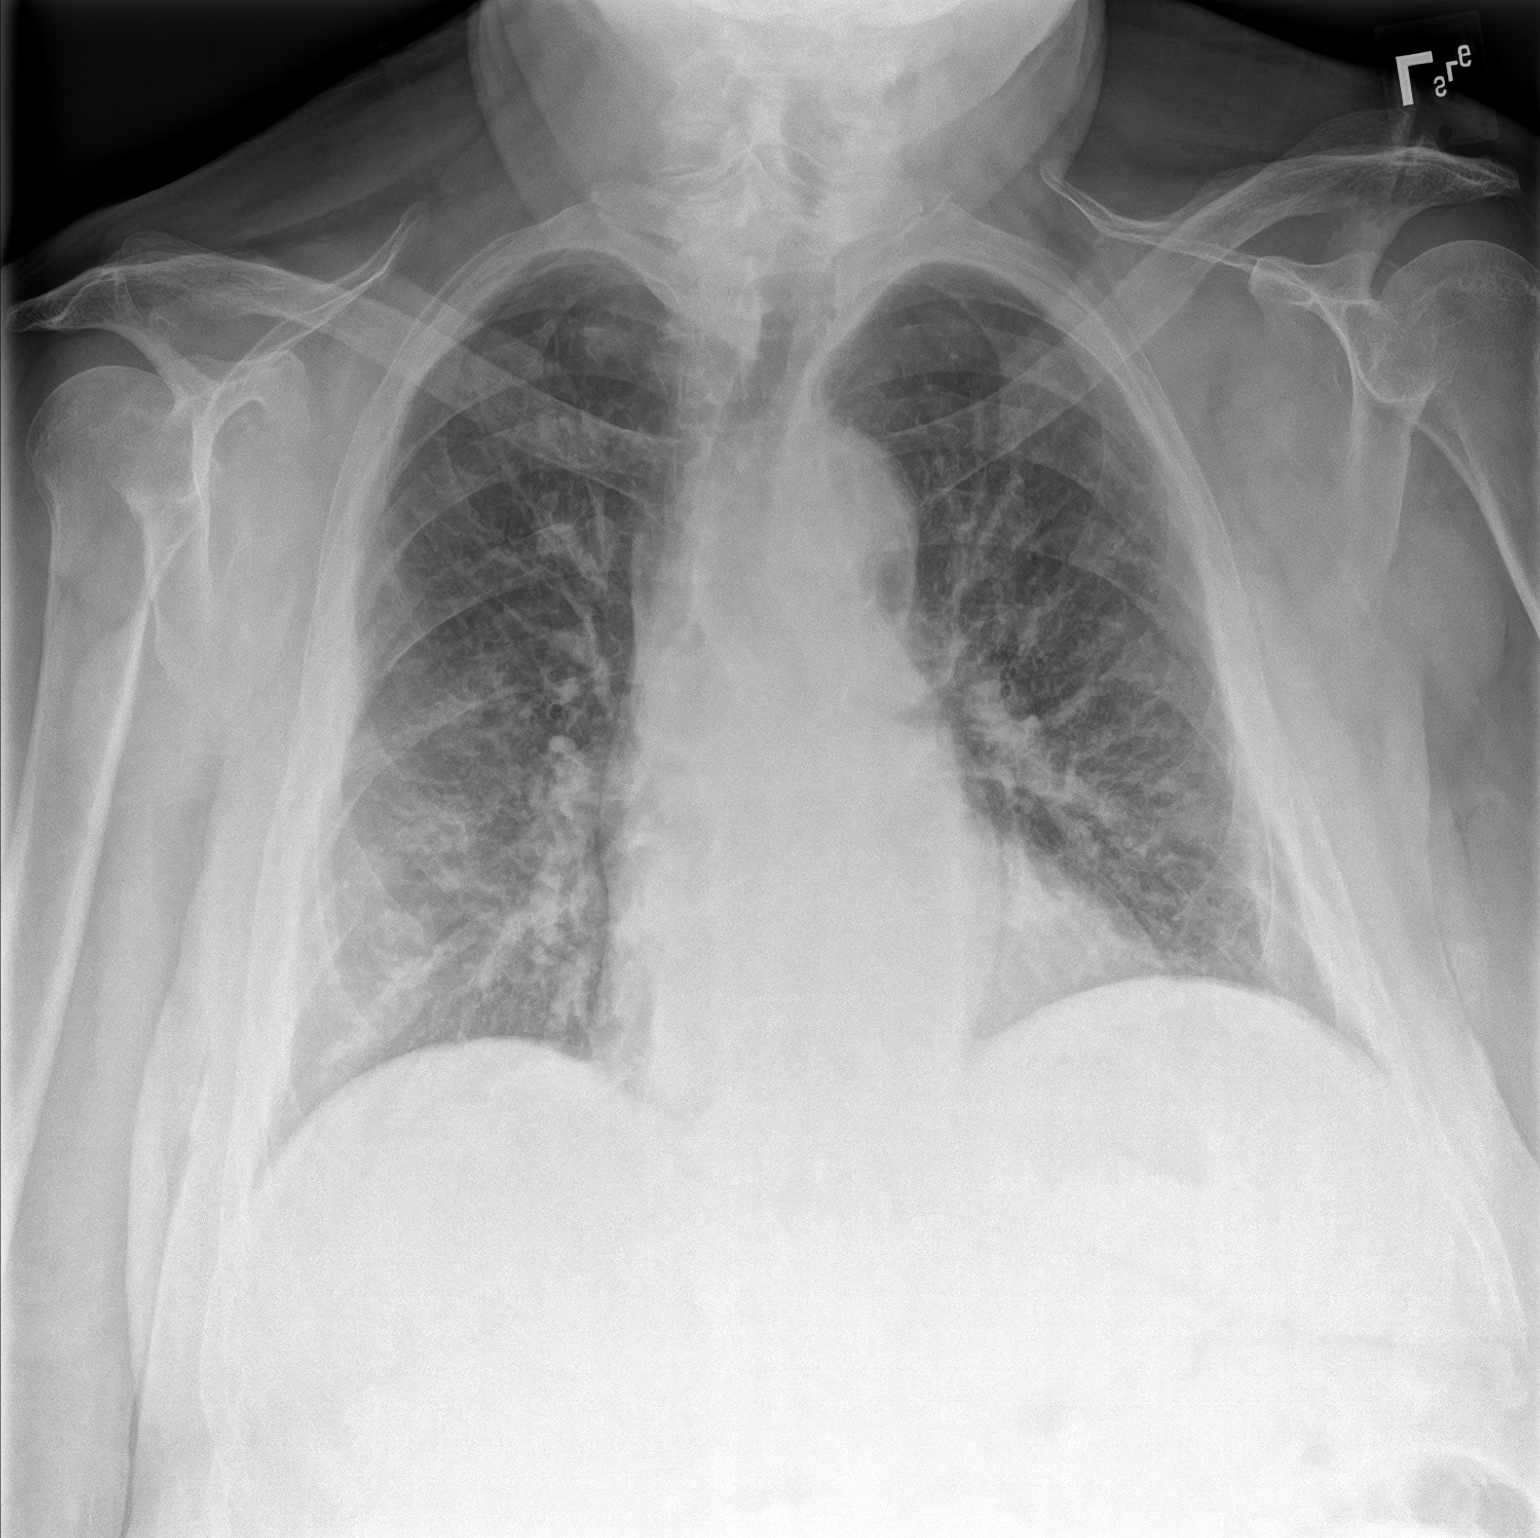
[im 2/2]
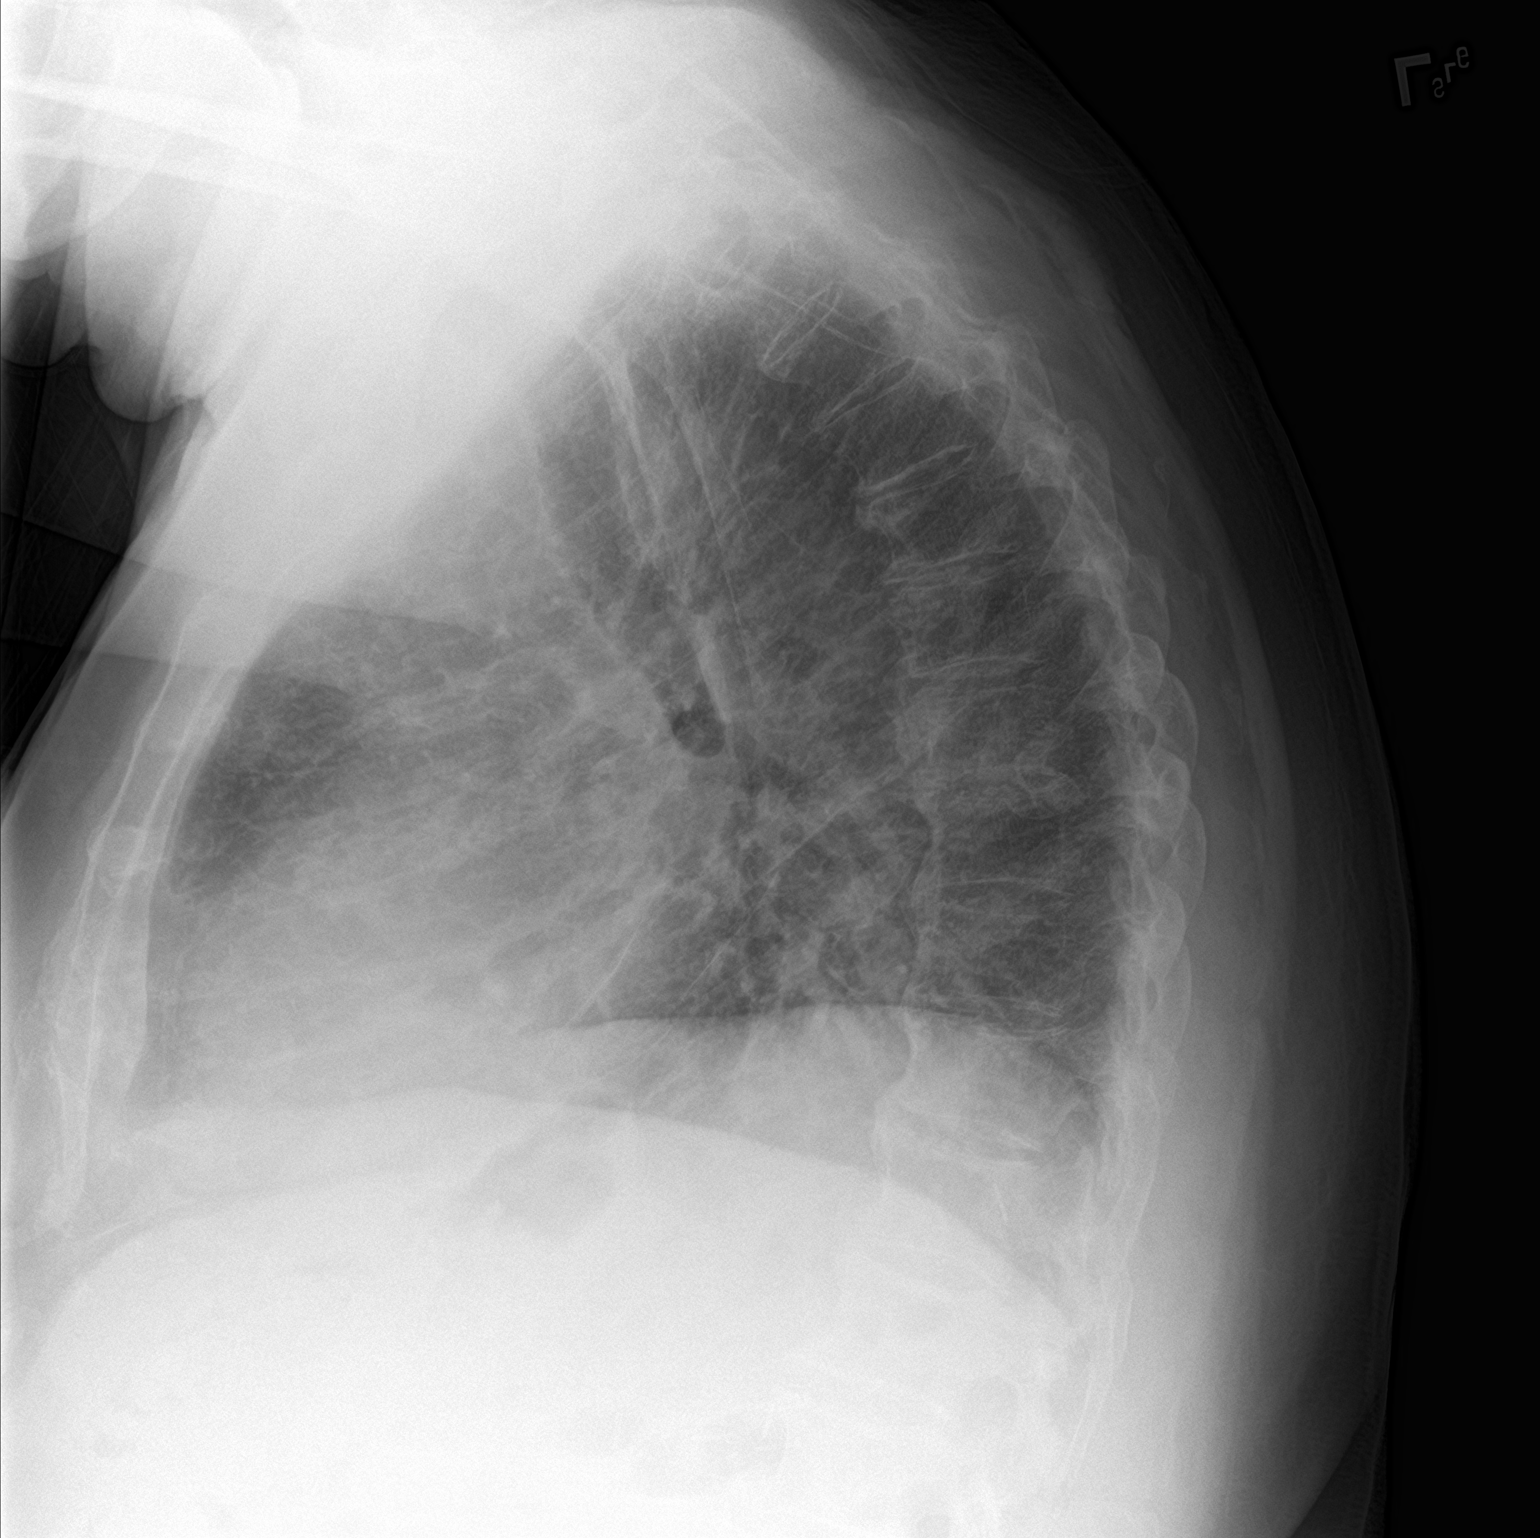

[2 of 2 positions shown; findings below may reference images not displayed]

FINDINGS: Mild, diffuse, chronic appearing increased interstitial lung
markings are noted. Mild atelectasis and/or early infiltrate is seen
within the bilateral lung bases, right greater than left. There is
no evidence of a pleural effusion or pneumothorax. The heart size
and mediastinal contours are within normal limits. The visualized
skeletal structures are unremarkable.
IMPRESSION: Mild bibasilar atelectasis and/or early infiltrate, right greater
than left.

## 2021-09-24 MED ORDER — UMECLIDINIUM-VILANTEROL 62.5-25 MCG/ACT IN AEPB: 1.0000 | INHALATION_SPRAY | Freq: Every day | RESPIRATORY_TRACT | Status: DC

## 2021-09-24 MED ORDER — BACITRACIN ZINC 500 UNIT/GM EX OINT
1.0000 "application " | TOPICAL_OINTMENT | Freq: Two times a day (BID) | CUTANEOUS | Status: DC
  Administered 2021-09-24 – 2021-09-25 (×2): 1 via TOPICAL
  Filled 2021-09-24: qty 28.4

## 2021-09-24 MED ORDER — ACETAMINOPHEN 325 MG PO TABS
650.0000 mg | ORAL_TABLET | Freq: Four times a day (QID) | ORAL | Status: DC | PRN
  Administered 2021-09-24 – 2021-09-25 (×4): 650 mg via ORAL
  Filled 2021-09-24 (×4): qty 2

## 2021-09-24 NOTE — Progress Notes (Signed)
Admission profile updated. ?

## 2021-09-24 NOTE — Progress Notes (Signed)
?PROGRESS NOTE ? ? ? ?Larry Callahan  YDX:[id number] DOB: 06/30/1955 DOA: 09/23/2021 ? ?PCP: [name], NP  ? ? ?Brief Narrative:  ?This 66 years old male with PMH significant for diabetes mellitus, hypertension, chronic systolic CHF, nonspecific cardiomyopathy, COPD, A-fib/A-flutter on Eliquis and recent ED evaluation on 09/21/2021 for a laceration to his left hand sustained from a knife.  He presented to the ED with generalized weakness and atypical left-sided chest pain.  Patient was seen by cardiology recommended to continue current medications.  Continue Eliquis and outpatient follow-up in a month for possible cardioversion if continues to remain in A-fib.  No indication for any ischemic evaluation.  PT and OT recommended SNF but patient refused to go to SNF wants to go home. ? ?Assessment & Plan: ?  ?Principal Problem: ?  Generalized weakness ?Active Problems: ?  Chest pain ?  Atherosclerotic heart disease of native coronary artery without angina pectoris ?  Atrial flutter ([place]) ?  Chronic anticoagulation ?  Shortness of breath ?  Pleural effusion ?  Chronic systolic CHF (congestive heart failure) ([place]) ?  COPD (chronic obstructive pulmonary disease) ([place]) ?  Essential hypertension ?  Obstructive sleep apnea, adult ?  Acute kidney injury superimposed on CKD 3b ([place]) ?  Gastroesophageal reflux disease without esophagitis ? ?Generalized weakness /severe deconditioning: ?Patient presented with generalized weakness , appears severely deconditioned. ?PT and OT recommended SNF but patient refused and wants to go home with home health services. ? ?Chest pain, atypical: ?Patient presented with atypical chest pain, gets worse with deep breath. ?Troponins unremarkable, EKG no ST-T wave changes. ?Cardiology consulted recommended to continue current medications.   ?Suspect non-cardiac, no indication for any ischemic evaluation. ? ?Atrial flutter/atrial fibrillation: ?Heart rate remains controlled. ?Continue  Eliquis.  Importance of anticoagulation discussed in detail. ?Plan for cardioversion if compliant with anticoagulation after 4 weeks ? ?COPD: ?Appears compensated.  Continue DuoNeb as needed, inhaled Pulmicort. ?Continue supplemental oxygen as appropriate. ?Procalcitonin negative so antibiotic discontinued. ? ?Obstructive sleep apnea: ?Continue CPAP at night. ? ?GERD ?Continue pantoprazole ? ?AKI on CKD stage IIIb: ?Baseline serum creatinine between 2.5-2.6, creatinine on admission 3.43 ?Avoid nephrotoxic medication, continue to monitor serum creatinine. ? ?Essential hypertension: ?Continue Coreg, hydralazine and amlodipine. ? ?Chronic systolic and diastolic CHF: ?Chest x-ray shows stable left pleural effusion. ?Continue Lasix, beta-blockers and hydralazine. ?Avoid ACE inhibitors or Entresto in setting of AKI on CKD. ? ? ?DVT prophylaxis: Eliquis ?Code Status: Full code ?Family Communication: No family at bedside ?Disposition Plan: ? ?Status is: Observation ?The patient remains OBS appropriate and will d/c before 2 midnights. ? ?Admitted for generalized weakness, atypical chest pain PT and OT recommended SNF but patient refused wants to go home with home health services. ?  ?Consultants:  ?Cardiology ? ?Procedures: (None ) ?antimicrobials: None ? ?Subjective: ?Patient was seen and examined at bedside.  Overnight events noted. ?Patient reports feeling better but he still reports having weakness, he refused to go to SNF. States he wants to go home. ? ?Objective: ?Vitals:  ? 09/24/21 0800 09/24/21 0908 09/24/21 1118 09/24/21 1304  ?BP: 133/64 122/78 126/80 133/82  ?Pulse: (!) 56 86 74 65  ?Resp: '20 18 14 18  '$ ?Temp:   98.4 ?F (36.9 ?C) 98.1 ?F (36.7 ?C)  ?TempSrc:   Oral   ?SpO2: 94% 93% 95% 97%  ?Weight:      ?Height:      ? ?No intake or output data in the 24 hours ending 09/24/21 1406 ?Filed Weights  ?  09/23/21 1554  ?Weight: 113.4 kg  ? ? ?Examination: ? ?General exam: Appears comfortable, not in any acute distress,  deconditioned ?Respiratory system: CTA bilaterally, no wheezing, no crackles, normal respiratory effort. ?Cardiovascular system: S1-S2 heard, regular rhythm, no murmur. ?Gastrointestinal system: Abdomen is soft, non tender, non distended, BS+ ?Central nervous system: Alert and oriented x 3. No focal neurological deficits. ?Extremities: No edema, no cyanosis, no clubbing. ?Skin: No rashes, lesions or ulcers ?Psychiatry: Judgement and insight appear normal. Mood & affect appropriate.  ? ? ? ?Data Reviewed: I have personally reviewed following labs and imaging studies ? ?CBC: ?Recent Labs  ?Lab 09/18/21 ?0805 09/23/21 ?1550  ?WBC 10.3 8.4  ?NEUTROABS 7.4  --   ?HGB 9.6* 10.3*  ?HCT 30.3* 33.5*  ?MCV 81.2 86.1  ?PLT 248 218  ? ?Basic Metabolic Panel: ?Recent Labs  ?Lab 09/18/21 ?0805 09/23/21 ?1550  ?NA 130* 136  ?K 3.2* 4.3  ?CL 95* 104  ?CO2 24 23  ?GLUCOSE 230* 140*  ?BUN 85* 53*  ?CREATININE 3.13* 2.62*  ?CALCIUM 8.3* 8.6*  ?MG 2.3  --   ? ?GFR: ?Estimated Creatinine Clearance: 34.5 mL/min (A) (by C-G formula based on SCr of 2.62 mg/dL (H)). ?Liver Function Tests: ?No results for input(s): AST, ALT, ALKPHOS, BILITOT, PROT, ALBUMIN in the last 168 hours. ?No results for input(s): LIPASE, AMYLASE in the last 168 hours. ?No results for input(s): AMMONIA in the last 168 hours. ?Coagulation Profile: ?No results for input(s): INR, PROTIME in the last 168 hours. ?Cardiac Enzymes: ?Recent Labs  ?Lab 09/23/21 ?1550  ?CKTOTAL 82  ? ?BNP (last 3 results) ?No results for input(s): PROBNP in the last 8760 hours. ?HbA1C: ?No results for input(s): HGBA1C in the last 72 hours. ?CBG: ?Recent Labs  ?Lab 09/18/21 ?1122 09/23/21 ?2213 09/24/21 ?0744 09/24/21 ?1131 09/24/21 ?1317  ?GLUCAP 236* 157* 106* 217* 197*  ? ?Lipid Profile: ?No results for input(s): CHOL, HDL, LDLCALC, TRIG, CHOLHDL, LDLDIRECT in the last 72 hours. ?Thyroid Function Tests: ?No results for input(s): TSH, T4TOTAL, FREET4, T3FREE, THYROIDAB in the last 72  hours. ?Anemia Panel: ?No results for input(s): VITAMINB12, FOLATE, FERRITIN, TIBC, IRON, RETICCTPCT in the last 72 hours. ?Sepsis Labs: ?Recent Labs  ?Lab 09/23/21 ?1755  ?PROCALCITON <0.10  ? ? ?Recent Results (from the past 240 hour(s))  ?Resp Panel by RT-PCR (Flu A&B, Covid) Nasopharyngeal Swab     Status: None  ? Collection Time: 09/16/21  6:06 PM  ? Specimen: Nasopharyngeal Swab; Nasopharyngeal(NP) swabs in vial transport medium  ?Result Value Ref Range Status  ? SARS Coronavirus 2 by RT PCR NEGATIVE NEGATIVE Final  ?  Comment: (NOTE) ?SARS-CoV-2 target nucleic acids are NOT DETECTED. ? ?The SARS-CoV-2 RNA is generally detectable in upper respiratory ?specimens during the acute phase of infection. The lowest ?concentration of SARS-CoV-2 viral copies this assay can detect is ?138 copies/mL. A negative result does not preclude SARS-Cov-2 ?infection and should not be used as the sole basis for treatment or ?other patient management decisions. A negative result may occur with  ?improper specimen collection/handling, submission of specimen other ?than nasopharyngeal swab, presence of viral mutation(s) within the ?areas targeted by this assay, and inadequate number of viral ?copies(<138 copies/mL). A negative result must be combined with ?clinical observations, patient history, and epidemiological ?information. The expected result is Negative. ? ?Fact Sheet for Patients:  ?EntrepreneurPulse.com.au ? ?Fact Sheet for Healthcare Providers:  ?IncredibleEmployment.be ? ?This test is no t yet approved or cleared by the [place] FDA and  ?has  been authorized for detection and/or diagnosis of SARS-CoV-2 by ?FDA under an Emergency Use Authorization (EUA). This EUA will remain  ?in effect (meaning this test can be used) for the duration of the ?COVID-19 declaration under Section 564(b)(1) of the Act, 21 ?U.S.C.section 360bbb-3(b)(1), unless the authorization is terminated  ?or revoked  sooner.  ? ? ?  ? Influenza A by PCR NEGATIVE NEGATIVE Final  ? Influenza B by PCR NEGATIVE NEGATIVE Final  ?  Comment: (NOTE) ?The Xpert Xpress SARS-CoV-2/FLU/RSV plus assay is intended as an aid ?in the diagn

## 2021-09-24 NOTE — Evaluation (Signed)
Physical Therapy Evaluation ?Patient Details ?Name: Larry Callahan ?MRN: [id number] ?DOB: 1955-12-16 ?Today's Date: 09/24/2021 ? ?History of Present Illness ? Pt is a 66 y.o. male with medical history significant for nonischemic cardiomyopathy, paroxysmal A-fib on Eliquis, type 2 diabetes, CKD 3, HTN, OSA, HFrEF 40-45%, A-flutter, PAD, COPD, former tobacco user, GERD, and recent ED evaluation on 4/11 for a laceration to his left hand sustained from a knife who presented to [facility] ED with generalized weakness and left sided chest pain. MD assessment includes: Nonobstructive CAD/atypical chest pain, persistent atrial flutter, generalized weakness, atherosclerotic heart disease of native coronary artery without angina pectoris, and AKI on CKD 3. ?  ?Clinical Impression ? Pt willing to participate and put forth fair effort during the session but was very easily distracted and required frequent cuing to stay on task.  Pt presented with a significant decline in functional status compared to his baseline as well as compared to functional ability during his recent prior admission.  Pt required assist with bed mobility, transfers, and gait and ambulated with poor stability and mild knee buckling.  Pt would not be safe to return to his prior living situation at this time.  Pt will benefit from PT services in a SNF setting upon discharge to safely address deficits listed in patient problem list for decreased caregiver assistance and eventual return to PLOF. ?   ?   ? ?Recommendations for follow up therapy are one component of a multi-disciplinary discharge planning process, led by the attending physician.  Recommendations may be updated based on patient status, additional functional criteria and insurance authorization. ? ?Follow Up Recommendations Skilled nursing-short term rehab (<3 hours/day) ? ?  ?Assistance Recommended at Discharge Frequent or constant Supervision/Assistance  ?Patient can return home with the  following ? A lot of help with walking and/or transfers;A little help with bathing/dressing/bathroom;Assistance with cooking/housework;Direct supervision/assist for medications management;Assist for transportation;Help with stairs or ramp for entrance ? ?  ?Equipment Recommendations Rolling walker (2 wheels)  ?Recommendations for Other Services ?    ?  ?Functional Status Assessment Patient has had a recent decline in their functional status and demonstrates the ability to make significant improvements in function in a reasonable and predictable amount of time.  ? ?  ?Precautions / Restrictions Precautions ?Precautions: Fall ?Restrictions ?Weight Bearing Restrictions: No  ? ?  ? ?Mobility ? Bed Mobility ?Overal bed mobility: Needs Assistance ?Bed Mobility: Supine to Sit, Sit to Supine ?  ?  ?Supine to sit: Min assist ?Sit to supine: Supervision ?  ?General bed mobility comments: Min A for trunk control ?  ? ?Transfers ?Overall transfer level: Needs assistance ?Equipment used: Rolling walker (2 wheels) ?Transfers: Sit to/from Stand ?Sit to Stand: Min assist ?  ?  ?  ?  ?  ?General transfer comment: Min A for stability and mod cues for sequencing ?  ? ?Ambulation/Gait ?Ambulation/Gait assistance: Min assist ?Gait Distance (Feet): 20 Feet ?Assistive device: Rolling walker (2 wheels) ?Gait Pattern/deviations: Step-through pattern, Decreased step length - right, Decreased step length - left, Shuffle ?Gait velocity: decreased ?  ?  ?General Gait Details: Pt unsteady with amb with frequent minor BLE knee buckling, min A required for stability ? ?Stairs ?  ?  ?  ?  ?  ? ?Wheelchair Mobility ?  ? ?Modified Rankin (Stroke Patients Only) ?  ? ?  ? ?Balance Overall balance assessment: Needs assistance ?  ?Sitting balance-Leahy Scale: Fair ?  ?  ?Standing balance support: Bilateral upper extremity  supported, During functional activity ?Standing balance-Leahy Scale: Poor ?Standing balance comment: Min A for stability ?  ?  ?  ?  ?   ?  ?  ?  ?  ?  ?  ?   ? ? ? ?Pertinent Vitals/Pain Pain Assessment ?Pain Assessment: 0-10 ?Pain Score: 8  ?Pain Location: chest ?Pain Descriptors / Indicators: Sore ?Pain Intervention(s): Repositioned, Monitored during session, Premedicated before session  ? ? ?Home Living Family/patient expects to be discharged to:: Private residence ?Living Arrangements: Alone ?Available Help at Discharge: Family;Available PRN/intermittently ?Type of Home: Mobile home ?Home Access: Stairs to enter ?Entrance Stairs-Rails: Left ?Entrance Stairs-Number of Steps: 5 ?  ?Home Layout: One level ?Home Equipment: Kasandra Knudsen - single point ?Additional Comments: CPAP  ?  ?Prior Function Prior Level of Function : Independent/Modified Independent ?  ?  ?  ?  ?  ?  ?Mobility Comments: Ind amb community distances without an AD, no fall history ?ADLs Comments: Ind with ADLs ?  ? ? ?Hand Dominance  ? Dominant Hand: Right ? ?  ?Extremity/Trunk Assessment  ? Upper Extremity Assessment ?Upper Extremity Assessment: Generalized weakness ?  ? ?Lower Extremity Assessment ?Lower Extremity Assessment: Generalized weakness ?  ? ?   ?Communication  ? Communication: No difficulties  ?Cognition Arousal/Alertness: Awake/alert ?Behavior During Therapy: [facility] for tasks assessed/performed ?Overall Cognitive Status: Within Functional Limits for tasks assessed ?  ?  ?  ?  ?  ?  ?  ?  ?  ?  ?  ?  ?  ?  ?  ?  ?  ?  ?  ? ?  ?General Comments   ? ?  ?Exercises    ? ?Assessment/Plan  ?  ?PT Assessment Patient needs continued PT services  ?PT Problem List Decreased strength;Decreased activity tolerance;Decreased balance;Decreased mobility;Decreased knowledge of use of DME;Decreased safety awareness ? ?   ?  ?PT Treatment Interventions DME instruction;Gait training;Stair training;Functional mobility training;Therapeutic activities;Therapeutic exercise;Balance training;Patient/family education   ? ?PT Goals (Current goals can be found in the Care Plan section)  ?Acute Rehab PT  Goals ?Patient Stated Goal: To get stronger ?PT Goal Formulation: With patient ?Time For Goal Achievement: 10/07/21 ?Potential to Achieve Goals: Good ? ?  ?Frequency Min 2X/week ?  ? ? ?Co-evaluation   ?  ?  ?  ?  ? ? ?  ?AM-PAC PT "6 Clicks" Mobility  ?Outcome Measure Help needed turning from your back to your side while in a flat bed without using bedrails?: A Little ?Help needed moving from lying on your back to sitting on the side of a flat bed without using bedrails?: A Little ?Help needed moving to and from a bed to a chair (including a wheelchair)?: A Little ?Help needed standing up from a chair using your arms (e.g., wheelchair or bedside chair)?: A Little ?Help needed to walk in hospital room?: A Lot ?Help needed climbing 3-5 steps with a railing? : Total ?6 Click Score: 15 ? ?  ?End of Session Equipment Utilized During Treatment: Gait belt ?Activity Tolerance: Patient tolerated treatment well ?Patient left: in bed;with call bell/phone within reach;with bed alarm set ?Nurse Communication: Mobility status ?PT Visit Diagnosis: Muscle weakness (generalized) (M62.81);Difficulty in walking, not elsewhere classified (R26.2);Unsteadiness on feet (R26.81) ?  ? ?Time: [phone] ?PT Time Calculation (min) (ACUTE ONLY): 27 min ? ? ?Charges:   PT Evaluation ?$PT Eval Moderate Complexity: 1 Mod ?  ?  ?   ? ?D. Royetta Asal PT, DPT ?09/24/21, 11:15 AM ? ? ?

## 2021-09-24 NOTE — ED Notes (Signed)
RN aware bed assigned ?

## 2021-09-24 NOTE — TOC Initial Note (Signed)
Transition of Care (TOC) - Initial/Assessment Note  ? ? ?Patient Details  ?Name: Larry Callahan ?MRN: [id number] ?Date of Birth: 18-Dec-1955 ? ?Transition of Care (TOC) CM/SW Contact:    ?Shelbie Hutching, RN ?Phone Number: ?09/24/2021, 11:32 AM ? ?Clinical Narrative:                 ?Patient recently discharged from the hospital for atypical chest pain and AKI.  Patient brought into the emergency room for chest pain last night.  RNCM met with patient at the bedside.  Patient is easily distracted and changes the subject several times during conversation, had to continuously redirect. ?Patient is from home where he lives alone, sister lives near by but she is 55 and cannot really help him.  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  His arms are very scratched up and puncture to his right hand with sutures.  He reports getting scratched up by brush that he was clearing and the puncture is from a knife that got away from him when he was cutting cheese. ?Patient is open with [facility] for home health RN and PT, Merleen Nicely with [facility] notified patient in hospital.  Cardiologist is Dr. Rockey Situ, PCP is Dr. Humphrey Rolls, uses [facility] for prescriptions.  Patient uses [facility] for transportation.   ? ?Patient does not want to go to rehab, says he has to get back to his home and his cat.  ? ?Expected Discharge Plan: [place] ?Barriers to Discharge: Continued Medical Work up ? ? ?Patient Goals and CMS Choice ?Patient states their goals for this hospitalization and ongoing recovery are:: to get back home, he has a trailor and cat to take care of ?CMS Medicare.gov Compare Post Acute Care list provided to:: Patient ?Choice offered to / list presented to : Patient ? ?Expected Discharge Plan and Services ?Expected Discharge Plan: [place] ?  ?Discharge Planning Services: CM Consult ?Post Acute Care Choice: Resumption of Svcs/PTA Provider, Home Health ?Living  arrangements for the past 2 months: Mobile Home ?                ?DME Arranged: N/A ?DME Agency: NA ?  ?  ?  ?HH Arranged: RN, PT ?[place] Agency: [facility] ?Date HH Agency Contacted: 09/24/21 ?Time [place]: 2111 ?Representative spoke with at [place]: Merleen Nicely ? ?Prior Living Arrangements/Services ?Living arrangements for the past 2 months: Mobile Home ?Lives with:: Self ?Patient language and need for interpreter reviewed:: Yes ?Do you feel safe going back to the place where you live?: Yes      ?Need for Family Participation in Patient Care: Yes (Comment) ?Care giver support system in place?: No (comment) ?Current home services: DME (PT, cane, walker) ?Criminal Activity/Legal Involvement Pertinent to Current Situation/Hospitalization: No - Comment as needed ? ?Activities of Daily Living ?Home Assistive Devices/Equipment: None ?ADL Screening (condition at time of admission) ?Patient's cognitive ability adequate to safely complete daily activities?: Yes ?Is the patient deaf or have difficulty hearing?: No ?Does the patient have difficulty seeing, even when wearing glasses/contacts?: No ?Does the patient have difficulty concentrating, remembering, or making decisions?: No ?Patient able to express need for assistance with ADLs?: Yes ?Does the patient have difficulty dressing or bathing?: No ?Independently performs ADLs?: Yes (appropriate for developmental age) ?Does the patient have difficulty walking or climbing stairs?: No ?Weakness of Legs: Both ?Weakness of Arms/Hands: None ? ?  Permission Sought/Granted ?Permission sought to share information with : Case Manager, Other (comment) ?Permission granted to share information with : Yes, Verbal Permission Granted ?   ? Permission granted to share info w AGENCY: [facility] ?   ?   ? ?Emotional Assessment ?Appearance:: Appears older than stated age, Pia Mau, Malodorous ?Attitude/Demeanor/Rapport: Self-Absorbed, Complaining ?Affect (typically observed):  Irritable, Accepting ?Orientation: : Oriented to Self, Oriented to Place, Oriented to  Time, Oriented to Situation ?Alcohol / Substance Use: Not Applicable ?Psych Involvement: No (comment) ? ?Admission diagnosis:  Generalized weakness [R53.1] ?Patient Active Problem List  ? Diagnosis Date Noted  ? Pleural effusion 09/23/2021  ? Acute on chronic systolic CHF (congestive heart failure) ([place]) 08/05/2021  ? Acute CHF (congestive heart failure) ([place]) 08/05/2021  ? Acquired thrombophilia ([place]) 06/28/2021  ? Anemia in chronic kidney disease (CKD) 06/28/2021  ? Chest pain 06/28/2021  ? PAD (peripheral artery disease) ([place])   ? Cellulitis of right arm 03/02/2021  ? Chronic anticoagulation 03/02/2021  ? Atrial flutter ([place]) 01/07/2021  ? Syncope and collapse 11/16/2020  ? Demand ischemia ([place])   ? Influenza A 10/30/2020  ? HLD (hyperlipidemia) 10/06/2020  ? History of CVA (cerebrovascular accident) 09/25/2020  ? Gastroesophageal reflux disease without esophagitis 04/19/2020  ? Pneumonia due to COVID-19 virus 01/30/2020  ? Elevated troponin I level 01/29/2020  ? Suspected COVID-19 virus infection 01/29/2020  ? Lightheadedness 01/29/2020  ? Hematemesis 01/09/2020  ? Hospital discharge follow-up 11/26/2019  ? Generalized weakness 08/08/2019  ? Obesity (BMI 30-39.9) 08/08/2019  ? Acute right hip pain 06/17/2019  ? Inability to ambulate due to hip 06/17/2019  ? CKD (chronic kidney disease), stage IV ([place])   ? Hypervolemia   ? Type 2 diabetes mellitus with stage 3b chronic kidney disease, with long-term current use of insulin ([place])   ? Hypomagnesemia   ? Full code status 05/20/2019  ? Pleuritic chest pain   ? Multifocal pneumonia 05/19/2019  ? Acute kidney injury superimposed on CKD 3b ([place]) 05/09/2019  ? Hemoptysis 04/09/2019  ? Shortness of breath 03/26/2019  ? Uncontrolled type 2 diabetes mellitus with insulin therapy 03/12/2019  ? Acute on chronic heart failure with preserved ejection fraction (HFpEF) ([place])   ? Chronic systolic  CHF (congestive heart failure) ([place]) 03/01/2019  ? Diastolic dysfunction 22/33/6122  ? Iron deficiency anemia 12/31/2018  ? Atopic dermatitis 11/24/2018  ? Screening for colon cancer 11/06/2017  ? Uncontrolled type 2 diabetes mellitus with hyperglycemia ([place]) 11/06/2017  ? Hidradenitis 06/09/2017  ? Atherosclerotic heart disease of native coronary artery without angina pectoris 06/09/2017  ? 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 Obstructive sleep apnea, adult 06/09/2017  ? Morbid (severe) obesity due to excess calories ([place]) 06/09/2017  ? Cigarette nicotine dependence with nicotine-induced disorder 06/09/2017  ? Diabetic polyneuropathy associated with type 2 diabetes mellitus ([place]) 06/09/2017  ? Allergic rhinitis due to pollen 06/09/2017  ? Mixed hyperlipidemia 06/09/2017  ? COPD (chronic obstructive pulmonary disease) ([place]) 06/09/2017  ? Dyspnea on exertion 06/09/2017  ? Wheezing 06/09/2017  ? Dysuria 06/09/2017  ? Snoring 06/09/2017  ? Essential hypertension 06/09/2017  ? Personal history of other malignant neoplasm of kidney 06/09/2017  ? Pain in right hip 06/09/2017  ? Impacted cerumen, bilateral 06/09/2017  ? Secondary hyperparathyroidism, not elsewhere classified ([place]) 06/09/2017  ? Tinea corporis 06/09/2017  ? ?PCP:  [name], NP ?Pharmacy:   ?[facility] [place], [place] - [address] ?[address]  [place] ?[place] [address] ?Phone: [phone] Fax: [phone] ? ? ? ? ?Social Determinants of Health (SDOH) Interventions ?  ? ?Readmission Risk Interventions ? ?  08/07/2021  ?  2:36 PM 06/28/2021  ?  2:26 PM 03/05/2021  ?  2:03 PM  ?Readmission Risk Prevention Plan  ?Transportation Screening Complete Complete Complete  ?Medication Review Press photographer) Complete Complete Complete  ?PCP or Specialist appointment within 3-5 days of discharge Complete Complete Complete  ?[place] or Home Care Consult Complete Complete Complete  ?SW Recovery Care/Counseling Consult  Complete Complete   ?Palliative Care Screening Complete Not Applicable Not Applicable  ?Skilled Nursing Facility Complete Not Applicable Not Applicable  ? ? ? ?

## 2021-09-24 NOTE — Consult Note (Signed)
? ?Cardiology Consult  ?  ?Patient ID: Larry Callahan ?MRN: [id number], DOB/AGE: February 28, 1956  ? ?Admit date: 09/23/2021 ?Date of Consult: 09/24/2021 ? ?Primary Physician: [name], NP ?Primary Cardiologist: [name], MD ?Requesting Provider: [name], MD ? ?Patient Profile  ?  ?Larry Callahan is a 66 y.o. male with a history of chronic combined syst/diast CHF, NICM, CKD III-IV, COPD, nonobs CAD, DMII, persistent atrial flutter (eliquis), GERD, HTN, HL, obesity, and OSA, who is being seen today for the evaluation of atypical chest pain with normal HsTrops at the request of [name]. ? ?Past Medical History  ? ?Past Medical History:  ?Diagnosis Date  ? Allergy   ? Atrial flutter ([place])   ? a. Dx 12/2020--CHA2DS2VASc = 5-->eliquis.  ? Chronic combined systolic (congestive) and diastolic (congestive) heart failure (HCC)   ? a. 04/2019 Echo: EF 45-50%; b. 02/2019 Echo: EF 55-60%; c. 09/2020 Echo: EF 35-40%, glob HK. Nl RV size/fxn. Mild MR; d. 07/2021 Echo: EF 40-45%, mild LVH, low-nl RV fxn, no MR.  ? CKD (chronic kidney disease), stage III ([place])   ? COPD (chronic obstructive pulmonary disease) ([place])   ? Coronary artery disease   ? a. 2011 Cath: nonobs dzs; b. 09/2020 MV: EF 38%. No signif ischemia. ? inf MI vs diaph attenuation. GI uptake artifact.  ? Diabetes mellitus without complication ([place])   ? GERD (gastroesophageal reflux disease)   ? Hyperlipidemia   ? Hypertension   ? Morbid obesity ([place])   ? NICM (nonischemic cardiomyopathy) ([place])   ? a. 04/2019 Echo: EF 45-50%; b. 02/2019 Echo: EF 55-60%; c. 09/2020 Echo: EF 35-40%, glob HK; d. 09/2020 MV: No ischemia. ? inf infarct vs attenuation; d. 07/2021 Echo: EF 40-45%.  ? OSA (obstructive sleep apnea)   ? Stage 3b chronic kidney disease ([place])   ?  ?Past Surgical History:  ?Procedure Laterality Date  ? BACK SURGERY    ? CARDIAC CATHETERIZATION    ? CHOLECYSTECTOMY    ? COLONOSCOPY WITH PROPOFOL N/A 04/12/2019  ? Procedure: COLONOSCOPY WITH PROPOFOL;   Surgeon: [name], MD;  Location: [name] ENDOSCOPY;  Service: Gastroenterology;  Laterality: N/A;  ? ESOPHAGOGASTRODUODENOSCOPY N/A 04/12/2019  ? Procedure: ESOPHAGOGASTRODUODENOSCOPY (EGD);  Surgeon: [name], MD;  Location: [facility] ENDOSCOPY;  Service: Gastroenterology;  Laterality: N/A;  ? ESOPHAGOGASTRODUODENOSCOPY (EGD) WITH PROPOFOL N/A 11/21/2019  ? Procedure: ESOPHAGOGASTRODUODENOSCOPY (EGD) WITH PROPOFOL;  Surgeon: [name], MD;  Location: [facility] ENDOSCOPY;  Service: Gastroenterology;  Laterality: N/A;  ? FLEXIBLE BRONCHOSCOPY Bilateral 05/17/2019  ? Procedure: FLEXIBLE BRONCHOSCOPY;  Surgeon: [name], MD;  Location: [name] ORS;  Service: Pulmonary;  Laterality: Bilateral;  ? left arm surgery    ? nephrectomy Left   ? PARATHYROIDECTOMY    ? RIGHT HEART CATH N/A 03/29/2019  ? Procedure: RIGHT HEART CATH;  Surgeon: [name], MD;  Location: [place] CV LAB;  Service: Cardiovascular;  Laterality: N/A;  ?  ? ?Allergies ? ?Allergies  ?Allergen Reactions  ? Penicillins Rash  ? ? ?History of Present Illness  ?  ?66 y/o ? w/ the above complex PMH including chronic combined systolic diastolic congestive heart failure, nonischemic cardiomyopathy, persistent atrial flutter, stage III-IV chronic kidney disease, COPD, nonobstructive CAD, diabetes, GERD, hypertension, hyperlipidemia, obesity, and sleep apnea.  Prev admitted 09/2020 w/ chest pain and found to have an EF of 35-40% by echo.  Stress testing showed no ischemia w/ ? Of inf infarct, though felt to be 2/2 attenuation.  He was  medically managed w/ GDMT limited by CKD III.  In 12/2020, he ws seen in the ED w/ palpitations and new onset afib, which was initially managed w/ titration of ? blocker and initiation of eliquis.  He subsequently developed rate-controlled atrial flutter and we recommended [place] and outpt DCCV.  He has f/u in CHF clinic locally, but has not seen general cardiology in the outpt setting since July 2022  hospitalization.  He's had several hospitalizations over the past 8 months (arm cellulitis 02/2021; COPD 06/2021; CHF 07/2021; atypical c/p 09/17/2021).  He was seen by our team on 4/7 in the setting of atypical chest pain w minimal HsTrop elevation (20  18).  Pain was worse w/ deep breathing.  Pt reported noncompliance w/ OAC and was in rate-controlled aflutter.  Conservative rx w/ outpt f/u, OAC compliance, and eventual DCCV were advised.  Pt was d/c'd 4/8. ? ?Unfortunately, Mr. Tilghman has cont to have retrosternal chest pain that is worse with deep breathing, certain upper body mvmts, and palpation.  He lives locally by himself.  He says he's able to ambulate well @ home and can even mow his own lawn, though he is struggling some to walk w/ walker and PT today.  Due to ongoing c/p, he presented back to the ED on 4/13.  Here, ECG is notable for ongoing, rate-controlled atrial flutter, and HsTrops are normal.  Tele shows rate-controlled aflutter w/ occas PVCs.  He has chronic DOE.  He has been having orthostatic lightheadedness.  He denies palpitations, pnd, orthopnea, n, v, dizziness, syncope, edema, or early satiety. ? ?Inpatient Medications  ?  ? amLODipine  10 mg Oral Daily  ? apixaban  2.5 mg Oral BID  ? budesonide (PULMICORT) nebulizer solution  0.25 mg Nebulization BID  ? carvedilol  12.5 mg Oral BID  ? furosemide  40 mg Oral Daily  ? hydrALAZINE  10 mg Oral TID  ? insulin aspart  0-15 Units Subcutaneous TID WC  ? pantoprazole (PROTONIX) IV  40 mg Intravenous Q12H  ? rosuvastatin  10 mg Oral QHS  ? [START ON 09/25/2021] umeclidinium-vilanterol  1 puff Inhalation Daily  ? ? ?Family History  ?  ?Family History  ?Problem Relation Age of Onset  ? Diabetes Mother   ?     2003  ? Lung cancer Father   ? Diabetes Sister   ? Hypertension Sister   ? Heart disease Sister   ? Cancer Sister   ? Bone cancer Brother   ? ?He indicated that his mother is deceased. He indicated that his father is deceased. He indicated that the  status of his sister is unknown. He indicated that the status of his brother is unknown. ? ? ?Social History  ?  ?Social History  ? ?Socioeconomic History  ? Marital status: Single  ?  Spouse name: Not on file  ? Number of children: Not on file  ? Years of education: Not on file  ? Highest education level: Not on file  ?Occupational History  ? Not on file  ?Tobacco Use  ? Smoking status: Former  ?  Types: Cigarettes  ? Smokeless tobacco: Former  ?  Types: Chew  ?Vaping Use  ? Vaping Use: Never used  ?Substance and Sexual Activity  ? Alcohol use: No  ? Drug use: No  ? Sexual activity: Not Currently  ?Other Topics Concern  ? Not on file  ?Social History Narrative  ? Not on file  ? ?Social Determinants of Health  ? ?  Financial Resource Strain: Low Risk   ? Difficulty of Paying Living Expenses: Not very hard  ?Food Insecurity: Not on file  ?Transportation Needs: Not on file  ?Physical Activity: Not on file  ?Stress: Not on file  ?Social Connections: Not on file  ?Intimate Partner Violence: Not on file  ?  ? ?Review of Systems  ?  ?General:  No chills, fever, night sweats or weight changes.  ?Cardiovascular:  +++ chest/chest wall pain, +++ dyspnea on exertion, +++ lightheadedness, no edema, orthopnea, palpitations, paroxysmal nocturnal dyspnea. ?Dermatological: No rash, lesions/masses ?Respiratory: No cough, +++ dyspnea ?Urologic: No hematuria, dysuria ?Abdominal:   No nausea, vomiting, diarrhea, bright red blood per rectum, melena, or hematemesis ?Neurologic:  No visual changes, wkns, changes in mental status. ?All other systems reviewed and are otherwise negative except as noted above. ? ?Physical Exam  ?  ?Blood pressure 133/64, pulse (!) 56, temperature 97.8 ?F (36.6 ?C), temperature source Oral, resp. rate 20, height _0  (1.803 m), weight 113.4 kg, SpO2 94 %.  ?General: Pleasant, NAD ?Psych: Flat affect. ?Neuro: Alert and oriented X 3. Moves all extremities spontaneously. ?HEENT: Normal  ?Neck: Supple, obese,  difficult to gauge JVP.  No bruits. ?Lungs:  Resp regular and unlabored, CTA. ?Heart: IR, IR, no s3, s4, or murmurs. ?Abdomen: Obese, Soft, non-tender, non-distended, BS + x 4.  ?Extremities: No clubbing, cya

## 2021-09-24 NOTE — Care Management Obs Status (Signed)
MEDICARE OBSERVATION STATUS NOTIFICATION ? ? ?Patient Details  ?Name: Larry Callahan ?MRN: [id number] ?Date of Birth: [birth year] ? ? ?Medicare Observation Status Notification Given:  Yes ? ? ? ?Shelbie Hutching, RN ?09/24/2021, 11:16 AM ?

## 2021-09-25 DIAGNOSIS — R531 Weakness: Secondary | ICD-10-CM | POA: Diagnosis not present

## 2021-09-25 LAB — COMPREHENSIVE METABOLIC PANEL
ALT: 19 U/L (ref 0–44)
AST: 18 U/L (ref 15–41)
Albumin: 2.5 g/dL — ABNORMAL LOW (ref 3.5–5.0)
Alkaline Phosphatase: 50 U/L (ref 38–126)
Anion gap: 8 (ref 5–15)
BUN: 44 mg/dL — ABNORMAL HIGH (ref 8–23)
CO2: 23 mmol/L (ref 22–32)
Calcium: 8.3 mg/dL — ABNORMAL LOW (ref 8.9–10.3)
Chloride: 105 mmol/L (ref 98–111)
Creatinine, Ser: 2.15 mg/dL — ABNORMAL HIGH (ref 0.61–1.24)
GFR, Estimated: 33 mL/min — ABNORMAL LOW (ref >60)
Glucose, Bld: 139 mg/dL — ABNORMAL HIGH (ref 70–99)
Potassium: 3.8 mmol/L (ref 3.5–5.1)
Sodium: 136 mmol/L (ref 135–145)
Total Bilirubin: 0.8 mg/dL (ref 0.3–1.2)
Total Protein: 6.4 g/dL — ABNORMAL LOW (ref 6.5–8.1)

## 2021-09-25 LAB — CBC
HCT: 31.7 % — ABNORMAL LOW (ref 39.0–52.0)
Hemoglobin: 9.8 g/dL — ABNORMAL LOW (ref 13.0–17.0)
MCH: 26.3 pg (ref 26.0–34.0)
MCHC: 30.9 g/dL (ref 30.0–36.0)
MCV: 85.2 fL (ref 80.0–100.0)
Platelets: 190 10*3/uL (ref 150–400)
RBC: 3.72 MIL/uL — ABNORMAL LOW (ref 4.22–5.81)
RDW: 16.8 % — ABNORMAL HIGH (ref 11.5–15.5)
WBC: 6.1 10*3/uL (ref 4.0–10.5)
nRBC: 0 % (ref 0.0–0.2)

## 2021-09-25 LAB — GLUCOSE, CAPILLARY
Glucose-Capillary: 139 mg/dL — ABNORMAL HIGH (ref 70–99)
Glucose-Capillary: 186 mg/dL — ABNORMAL HIGH (ref 70–99)

## 2021-09-25 LAB — PHOSPHORUS: Phosphorus: 4.3 mg/dL (ref 2.5–4.6)

## 2021-09-25 LAB — MAGNESIUM: Magnesium: 2 mg/dL (ref 1.7–2.4)

## 2021-09-25 NOTE — Discharge Instructions (Signed)
Advised to follow up with PCP in one week. ?Advised to continue Eliquis regularly.  Compliance discussed. ?Advised to follow-up with cardiology in 1 week. ?
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
## 2021-09-25 NOTE — TOC Transition Note (Signed)
Transition of Care (TOC) - CM/SW Discharge Note ? ? ?Patient Details  ?Name: Larry Callahan ?MRN: [id number] ?Date of Birth: 1956/05/19 ? ?Transition of Care (TOC) CM/SW Contact:  ?Harriet Masson, RN ?Phone Number: ?09/25/2021, 10:55 AM ? ? ?Clinical Narrative:    ?Pt will be discharged today with [facility] resumed by [facility] Cecille Rubin). Taxi voucher provided for transportation needs with no other request.  ? ?TOC remains available for any other needs.  ? ? ?Final next level of care: [place] ?Barriers to Discharge: Continued Medical Work up ? ? ?Patient Goals and CMS Choice ?Patient states their goals for this hospitalization and ongoing recovery are:: to get back home, he has a trailor and cat to take care of ?CMS Medicare.gov Compare Post Acute Care list provided to:: Patient ?Choice offered to / list presented to : Patient ? ?Discharge Placement ?  ?           ?  ?  ?  ?Patient and family notified of of transfer: 09/25/21 (Spoke with pt) ? ?Discharge Plan and Services ?  ?Discharge Planning Services: CM Consult ?Post Acute Care Choice: Resumption of Svcs/PTA Provider, Home Health          ?DME Arranged: N/A ?DME Agency: NA ?  ?  ?  ?HH Arranged: RN, PT ?[place] Agency: [facility] ?Date HH Agency Contacted: 09/25/21 ?Time [place]: 1050 ?Representative spoke with at [place]: Cecille Rubin ? ?Social Determinants of Health (SDOH) Interventions ?  ? ? ?Readmission Risk Interventions ? ?  08/07/2021  ?  2:36 PM 06/28/2021  ?  2:26 PM 03/05/2021  ?  2:03 PM  ?Readmission Risk Prevention Plan  ?Transportation Screening Complete Complete Complete  ?Medication Review Press photographer) Complete Complete Complete  ?PCP or Specialist appointment within 3-5 days of discharge Complete Complete Complete  ?[place] or Home Care Consult Complete Complete Complete  ?SW Recovery Care/Counseling Consult Complete Complete   ?Palliative Care Screening Complete Not Applicable Not Applicable  ?Skilled Nursing  Facility Complete Not Applicable Not Applicable  ? ? ? ? ? ?

## 2021-09-27 ENCOUNTER — Telehealth: Payer: Self-pay | Admitting: Cardiovascular Disease

## 2021-09-27 ENCOUNTER — Telehealth: Payer: Medicare PPO | Admitting: Nurse Practitioner

## 2021-09-27 ENCOUNTER — Other Ambulatory Visit: Payer: Self-pay

## 2021-09-27 DIAGNOSIS — I5033 Acute on chronic diastolic (congestive) heart failure: Secondary | ICD-10-CM

## 2021-09-27 NOTE — Patient Outreach (Signed)
Received a referral from Netta Cedars, [facility] . ?I have assigned Enzo Montgomery, RN to call for follow up and determine if there are any Case Management needs.  ?  ?[place] Care, CBCS, CMAA ?[place] Management Assistant ?[place] Management ?[phone]   ?

## 2021-09-27 NOTE — Telephone Encounter (Signed)
Attempted to schedule hospital fu visit with Rockey Situ or Sharolyn Douglas  .  LMOV to call office.  ? ?

## 2021-09-28 ENCOUNTER — Other Ambulatory Visit: Payer: Self-pay

## 2021-09-28 ENCOUNTER — Telehealth: Payer: Self-pay

## 2021-09-28 LAB — CULTURE, BLOOD (ROUTINE X 2)
Culture: NO GROWTH
Culture: NO GROWTH
Special Requests: ADEQUATE

## 2021-09-28 NOTE — Telephone Encounter (Signed)
Spoke to pt, he said his bipap machine was broken and [employer] sent [name] a form that needed signed and faxed back. Spoke to [place] and they said the form was hand delivered and that once it was signed and returned then they will start the process to replacing the bipap machine for pt. The explained it will go through the insurance and will take time. Pt aware it can take 7 to 10 days.  ?

## 2021-09-28 NOTE — Telephone Encounter (Signed)
Attempted to schedule . Lmov to call office needs hospital fu .  ?

## 2021-09-28 NOTE — Patient Outreach (Signed)
[facility]) Care Management ? ?09/28/2021 ? ?Larry Callahan ?[birth year] ?[id number] ? ? ? ? ?Transition of Care Referral ? ?Referral Date: 09/27/2021 ?Referral [facility] Liaison ?Date of Discharge: 09/25/2021 ?Facility:[name] ? ? ? ? ?Outreach attempt # 1 to patient.  No answer at present. RN CM left HIPAA compliant voicemail along with contact info. ? ? ? ?Plan: ?RN CM will make outreach attempt within 4 business days.  ? ? ?Enzo Montgomery, RN,BSN,CCM ?[facility] Care Management ?Telephonic Care Management Coordinator ?Direct Phone: [phone] ?Toll Free: [phone] ?Fax: [phone] ? ?

## 2021-09-29 ENCOUNTER — Other Ambulatory Visit: Payer: Self-pay

## 2021-09-29 ENCOUNTER — Telehealth: Payer: Medicare PPO | Admitting: Nurse Practitioner

## 2021-09-29 NOTE — Patient Outreach (Signed)
[name]) Care Management ? ?09/29/2021 ? ?Larry Callahan ?06-Jun-1956 ?[id number] ? ? ?Transition of Care Referral ?  ?Referral Date: 09/27/2021 ?Referral [name] Liaison ?Date of Discharge: 09/25/2021 ?Facility:[name] ? ? ?Unsuccessful outreach attempt #2 to patient. ? ? ?Plan: ?RN CM will make outreach attempt within 4 business days. ? ? ?Enzo Montgomery, RN,BSN,CCM ?[name] Care Management ?Telephonic Care Management Coordinator ?Direct Phone: [phone] ?Toll Free: [phone] ?Fax: [phone] ? ? ? ?

## 2021-09-30 IMAGING — CR DG CHEST 2V
1 series · 2 of 2 positions shown · non-contrast
Comparison: 02/12/2020, CT 01/24/2020

CLINICAL DATA: Shortness of breath

EXAM:
CHEST - 2 VIEW

[Series 1: dg chest 2 view · 0.14mm/px · 2 of 2 slices shown]
[im 1/2]
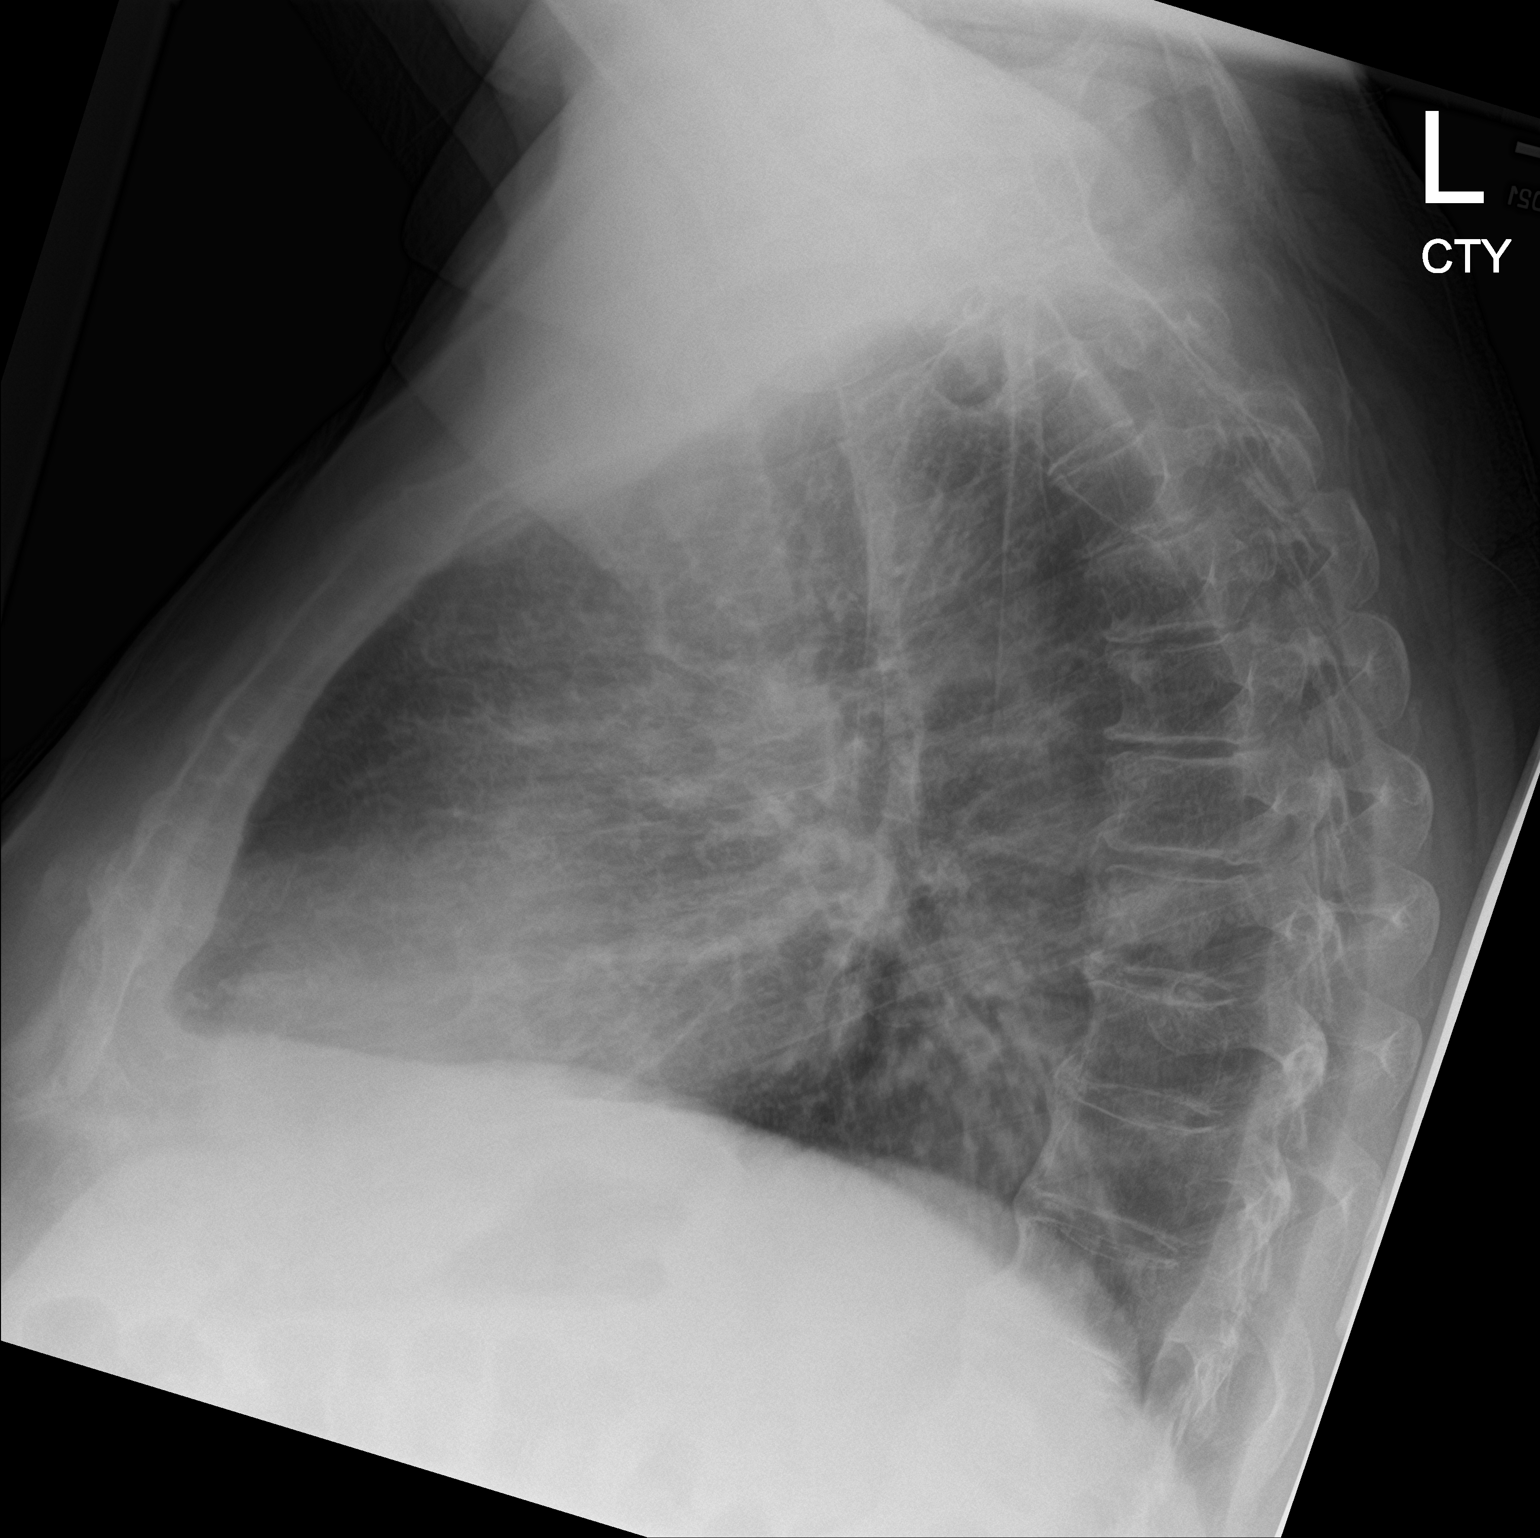
[im 2/2]
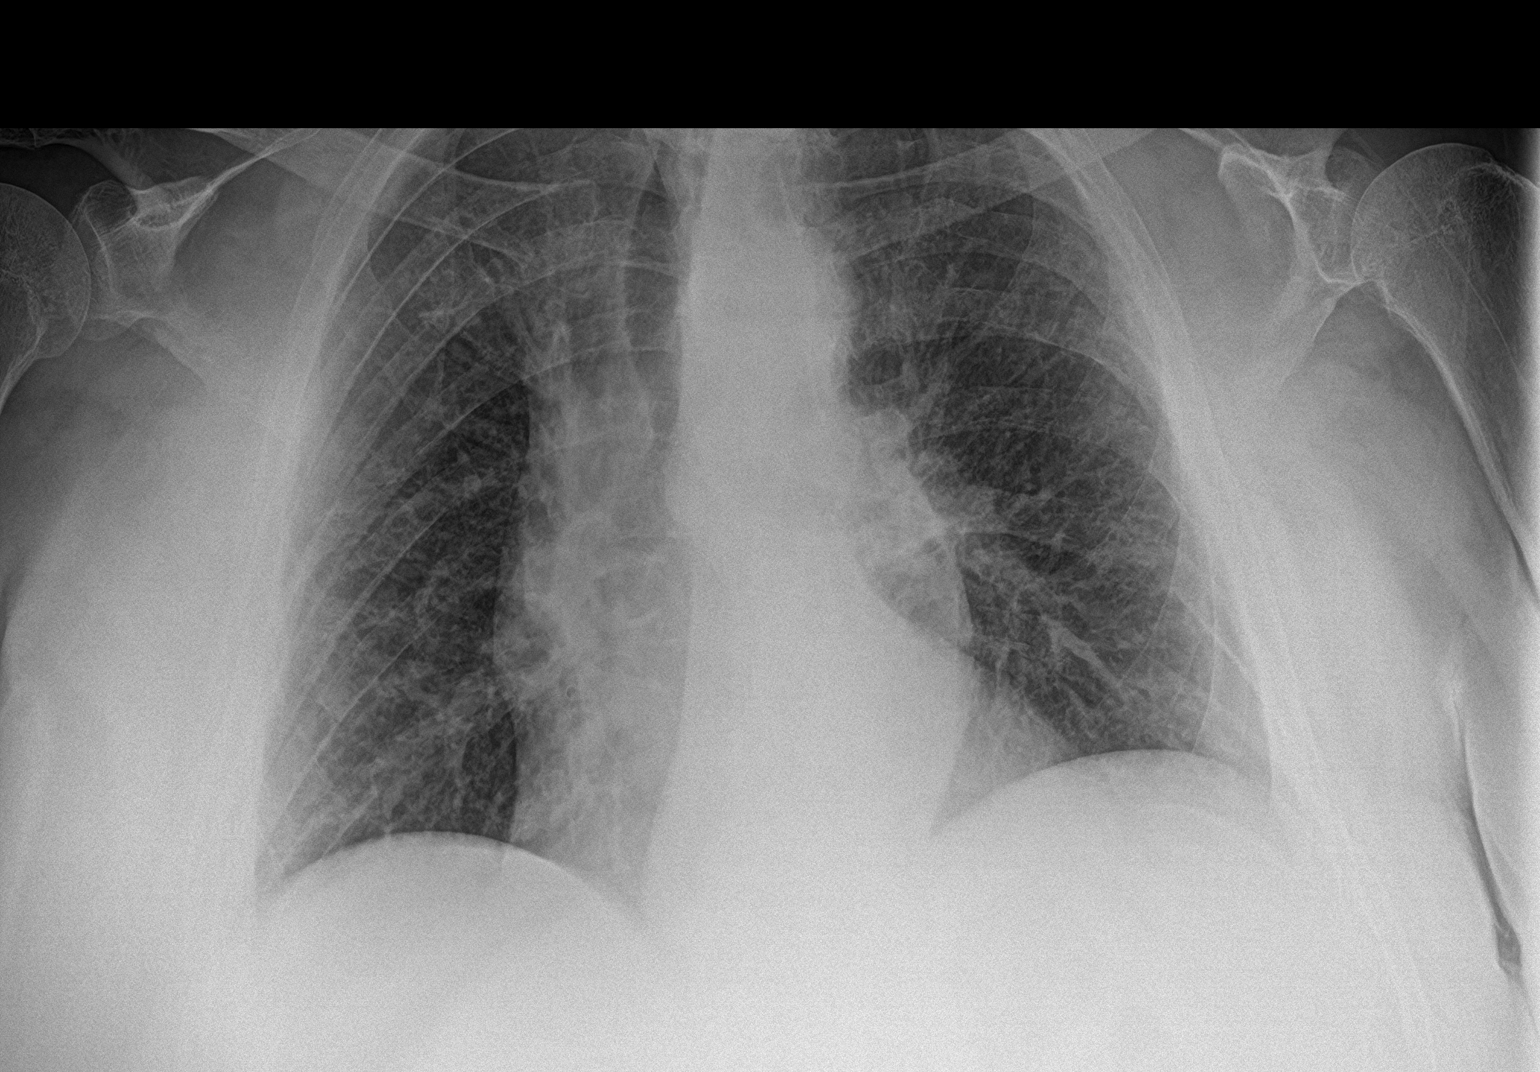

[2 of 2 positions shown; findings below may reference images not displayed]

FINDINGS: Improved aeration since 02/12/2020 with nearly resolved bilateral
infiltrates. No new consolidation or effusion. Stable
cardiomediastinal silhouette. No pneumothorax.
IMPRESSION: Improved aeration since 02/12/2020 with nearly resolved bilateral
infiltrates.

## 2021-10-01 ENCOUNTER — Other Ambulatory Visit: Payer: Self-pay

## 2021-10-01 NOTE — Patient Outreach (Signed)
[facility]) Care Management ? ?10/01/2021 ? ?Bettye Boeck Eicher ?Oct 31, 1955 ?[id number] ? ? ?Transition of Care Referral ?  ?Referral Date: 09/27/2021 ?Referral [facility] Liaison ?Date of Discharge: 09/25/2021 ?Facility:[name] ? ? ? ?Unsuccessful outreach attempt #3 to patient. ? ? ?Plan: ?RN CM will make outreach attempt to patient within 3-4 wks if no response from letter mailed to patient. ? ?Enzo Montgomery, RN,BSN,CCM ?[facility] Care Management ?Telephonic Care Management Coordinator ?Direct Phone: [phone] ?Toll Free: [phone] ?Fax: [phone] ? ?

## 2021-10-04 ENCOUNTER — Telehealth: Payer: Self-pay

## 2021-10-04 NOTE — Telephone Encounter (Signed)
Lvm letting patient know 10/05/21 appointment has been moved to 10/11/21-[name] ?

## 2021-10-05 ENCOUNTER — Encounter: Payer: Self-pay | Admitting: Family

## 2021-10-05 ENCOUNTER — Other Ambulatory Visit (HOSPITAL_COMMUNITY): Payer: Self-pay

## 2021-10-05 ENCOUNTER — Ambulatory Visit
Admission: RE | Admit: 2021-10-05 | Discharge: 2021-10-05 | Disposition: A | Discharge status: Home / Self Care | Payer: Medicare PPO | Source: Ambulatory Visit | Attending: Family | Admitting: Family

## 2021-10-05 ENCOUNTER — Telehealth: Payer: Medicare PPO | Admitting: Internal Medicine

## 2021-10-05 ENCOUNTER — Other Ambulatory Visit: Payer: Self-pay | Admitting: Family

## 2021-10-05 ENCOUNTER — Ambulatory Visit (HOSPITAL_BASED_OUTPATIENT_CLINIC_OR_DEPARTMENT_OTHER): Payer: Medicare PPO | Admitting: Family

## 2021-10-05 VITALS — BP 109/95 | HR 64 | Resp 16 | Ht 71.0 in | Wt 261.4 lb

## 2021-10-05 DIAGNOSIS — I130 Hypertensive heart and chronic kidney disease with heart failure and stage 1 through stage 4 chronic kidney disease, or unspecified chronic kidney disease: Secondary | ICD-10-CM | POA: Insufficient documentation

## 2021-10-05 DIAGNOSIS — G4733 Obstructive sleep apnea (adult) (pediatric): Secondary | ICD-10-CM | POA: Insufficient documentation

## 2021-10-05 DIAGNOSIS — I428 Other cardiomyopathies: Secondary | ICD-10-CM | POA: Diagnosis not present

## 2021-10-05 DIAGNOSIS — Z79899 Other long term (current) drug therapy: Secondary | ICD-10-CM | POA: Insufficient documentation

## 2021-10-05 DIAGNOSIS — I890 Lymphedema, not elsewhere classified: Secondary | ICD-10-CM | POA: Diagnosis not present

## 2021-10-05 DIAGNOSIS — D539 Nutritional anemia, unspecified: Secondary | ICD-10-CM | POA: Insufficient documentation

## 2021-10-05 DIAGNOSIS — I5023 Acute on chronic systolic (congestive) heart failure: Secondary | ICD-10-CM | POA: Insufficient documentation

## 2021-10-05 DIAGNOSIS — Z794 Long term (current) use of insulin: Secondary | ICD-10-CM | POA: Diagnosis not present

## 2021-10-05 DIAGNOSIS — N184 Chronic kidney disease, stage 4 (severe): Secondary | ICD-10-CM | POA: Insufficient documentation

## 2021-10-05 DIAGNOSIS — E1122 Type 2 diabetes mellitus with diabetic chronic kidney disease: Secondary | ICD-10-CM | POA: Diagnosis not present

## 2021-10-05 DIAGNOSIS — J449 Chronic obstructive pulmonary disease, unspecified: Secondary | ICD-10-CM | POA: Diagnosis not present

## 2021-10-05 DIAGNOSIS — I10 Essential (primary) hypertension: Secondary | ICD-10-CM

## 2021-10-05 LAB — BASIC METABOLIC PANEL
Anion gap: 9 (ref 5–15)
BUN: 43 mg/dL — ABNORMAL HIGH (ref 8–23)
CO2: 20 mmol/L — ABNORMAL LOW (ref 22–32)
Calcium: 8.9 mg/dL (ref 8.9–10.3)
Chloride: 109 mmol/L (ref 98–111)
Creatinine, Ser: 2.13 mg/dL — ABNORMAL HIGH (ref 0.61–1.24)
GFR, Estimated: 33 mL/min — ABNORMAL LOW (ref >60)
Glucose, Bld: 142 mg/dL — ABNORMAL HIGH (ref 70–99)
Potassium: 4.3 mmol/L (ref 3.5–5.1)
Sodium: 138 mmol/L (ref 135–145)

## 2021-10-05 LAB — BRAIN NATRIURETIC PEPTIDE: B Natriuretic Peptide: 221.4 pg/mL — ABNORMAL HIGH (ref 0.0–100.0)

## 2021-10-05 MED ORDER — FUROSEMIDE 10 MG/ML IJ SOLN: 80.0000 mg | Freq: Once | INTRAMUSCULAR | Status: AC

## 2021-10-05 MED ORDER — POTASSIUM CHLORIDE CRYS ER 20 MEQ PO TBCR
EXTENDED_RELEASE_TABLET | ORAL | Status: AC
  Administered 2021-10-05: 40 meq via ORAL
  Filled 2021-10-05: qty 2

## 2021-10-05 MED ORDER — FUROSEMIDE 10 MG/ML IJ SOLN
INTRAMUSCULAR | Status: AC
  Administered 2021-10-05: 80 mg via INTRAVENOUS
  Filled 2021-10-05: qty 8

## 2021-10-05 MED ORDER — POTASSIUM CHLORIDE CRYS ER 20 MEQ PO TBCR: 40.0000 meq | EXTENDED_RELEASE_TABLET | Freq: Once | ORAL | Status: AC

## 2021-10-05 NOTE — Progress Notes (Signed)
Today met with Larry Callahan at his HF clinic appt.  He was fluid overloaded and was sent to IV lasix.  He stated his sister says he gets confused with his meds.  Will visit with him at his home next week to check his meds, pill box he fills and visit for heart failure education and diet.  Will go over weighing daily, eating low sodium foods and how much fluids to intake daily.  ? ?Lennard Capek ?[place] EMT-Paramedic ?[phone] ?

## 2021-10-05 NOTE — Progress Notes (Signed)
Orders for IV lasix/ oral potassium given ?

## 2021-10-05 NOTE — Progress Notes (Signed)
? Patient ID: Larry Callahan, male    DOB: May 20, 1956, 66 y.o.   MRN: [id number] ? ?HPI ? ?Larry Callahan is a 66 yo male with a PMHx of nonischemic cardiomyopathy last EF measured in April 2022 was 38 to 40%, COPD, sleep apnea, chronic kidney disease stage IV, chronic anemia, hypertension, and diabetes mellitus. ? ?Echo from 08/08/21 reviewed and showed and EF of 40-45%. There is mild to moderately decreased function of the left ventricle. The diastolic parameters are indeterminate. The RV systolic function is low normal.  ? ?RHC done 03/29/2019 and showed: ?Pulmonary capillary wedge pressure mean of 8 ?PA: 32/16 mean 23 ?RA mean of 10 ?RV 26/12/13  ?Cardiac output 8.17 ?Cardiac index 3.42 ?Final Conclusions:   ?Normal right heart pressures ? ?Admitted 09/23/21 due to weakness and left-sided chest pain. PT/OT evaluations done. Cardiology consult obtained. Troponins unremarkable. Discharged after 2 days. Was in the ED 09/21/21 due to hand laceration. Sutures placed and he was released. Admitted 09/16/21 due to sudden chest pain. Cardiology consult obtained. NSTEMI ruled out. Furosemide decreased to daily with stoppage of chlorthalidone. Discharged in 2 days. Admitted 2/23-2/26/23 with worsening SOB, and orthopnea, diuresed 4.9 liters and ultimately d/c'd home with [facility] RN services.  ? ?He presents today to the heart failure clinic for a follow-up visit with a chief complaint of minimal shortness of breath with moderate exertion. Describes this as chronic in nature. He has associated cough, pedal edema, anxiety and fatigue along with this. He denies any difficulty sleeping, dizziness, abdominal distention, palpitations, chest pain or weight gain.  ? ?Says that the swelling in his legs is worsening but unclear for how long as patient has difficulty in focusing and answering the questions. He is concerned about the swelling because he only has one kidney. Unsure of how much fluid he drinks but roughly 48-64 ounces of  water, 8 ounces gingerale and "1 glass of milk". Denies drinking anything else but then is drinking coffee in the waiting room. Denies using salt and says that he doesn't go out to eat.  ? ?Past Medical History:  ?Diagnosis Date  ? Allergy   ? Atrial flutter ([place])   ? a. Dx 12/2020--CHA2DS2VASc = 5-->eliquis.  ? Chronic combined systolic (congestive) and diastolic (congestive) heart failure (HCC)   ? a. 04/2019 Echo: EF 45-50%; b. 02/2019 Echo: EF 55-60%; c. 09/2020 Echo: EF 35-40%, glob HK. Nl RV size/fxn. Mild MR; d. 07/2021 Echo: EF 40-45%, mild LVH, low-nl RV fxn, no MR.  ? CKD (chronic kidney disease), stage III ([place])   ? COPD (chronic obstructive pulmonary disease) ([place])   ? Coronary artery disease   ? a. 2011 Cath: nonobs dzs; b. 09/2020 MV: EF 38%. No signif ischemia. ? inf MI vs diaph attenuation. GI uptake artifact.  ? Diabetes mellitus without complication ([place])   ? GERD (gastroesophageal reflux disease)   ? Hyperlipidemia   ? Hypertension   ? Morbid obesity ([place])   ? NICM (nonischemic cardiomyopathy) ([place])   ? a. 04/2019 Echo: EF 45-50%; b. 02/2019 Echo: EF 55-60%; c. 09/2020 Echo: EF 35-40%, glob HK; d. 09/2020 MV: No ischemia. ? inf infarct vs attenuation; d. 07/2021 Echo: EF 40-45%.  ? OSA (obstructive sleep apnea)   ? Stage 3b chronic kidney disease ([place])   ? ?Past Surgical History:  ?Procedure Laterality Date  ? BACK SURGERY    ? CARDIAC CATHETERIZATION    ? CHOLECYSTECTOMY    ? COLONOSCOPY WITH PROPOFOL N/A 04/12/2019  ? Procedure: COLONOSCOPY  WITH PROPOFOL;  Surgeon: [name], MD;  Location: [facility] ENDOSCOPY;  Service: Gastroenterology;  Laterality: N/A;  ? ESOPHAGOGASTRODUODENOSCOPY N/A 04/12/2019  ? Procedure: ESOPHAGOGASTRODUODENOSCOPY (EGD);  Surgeon: [name], MD;  Location: [facility] ENDOSCOPY;  Service: Gastroenterology;  Laterality: N/A;  ? ESOPHAGOGASTRODUODENOSCOPY (EGD) WITH PROPOFOL N/A 11/21/2019  ? Procedure: ESOPHAGOGASTRODUODENOSCOPY (EGD) WITH PROPOFOL;  Surgeon: [name], MD;  Location: [facility] ENDOSCOPY;  Service: Gastroenterology;  Laterality: N/A;  ? FLEXIBLE BRONCHOSCOPY Bilateral 05/17/2019  ? Procedure: FLEXIBLE BRONCHOSCOPY;  Surgeon: [name], MD;  Location: [name] ORS;  Service: Pulmonary;  Laterality: Bilateral;  ? left arm surgery    ? nephrectomy Left   ? PARATHYROIDECTOMY    ? RIGHT HEART CATH N/A 03/29/2019  ? Procedure: RIGHT HEART CATH;  Surgeon: [name], MD;  Location: [place] CV LAB;  Service: Cardiovascular;  Laterality: N/A;  ? ?Family History  ?Problem Relation Age of Onset  ? Diabetes Mother   ?     2003  ? Lung cancer Father   ? Diabetes Sister   ? Hypertension Sister   ? Heart disease Sister   ? Cancer Sister   ? Bone cancer Brother   ? ?Social History  ? ?Tobacco Use  ? Smoking status: Former  ?  Types: Cigarettes  ? Smokeless tobacco: Former  ?  Types: Chew  ?Substance Use Topics  ? Alcohol use: No  ? ?Allergies  ?Allergen Reactions  ? Penicillins Rash  ? ?Prior to Admission medications   ?Medication Sig Start Date End Date Taking? Authorizing Provider  ?Accu-Chek Softclix Lancets lancets Use to check blood sugars 4 times a day   E11.65 08/20/21  Yes Abernathy, Alyssa, NP  ?amLODipine (NORVASC) 10 MG tablet Take 1 tablet (10 mg total) by mouth daily. 08/11/21  Yes Abernathy, Yetta Flock, NP  ?apixaban (ELIQUIS) 2.5 MG TABS tablet Take 1 tablet (2.5 mg total) by mouth 2 (two) times daily. 05/20/21  Yes Abernathy, Yetta Flock, NP  ?bacitracin 500 UNIT/GM ointment Apply 1 application. topically 2 (two) times daily. 09/21/21  Yes Triplett, Dessa Phi, FNP  ?Blood Glucose Calibration (TRUE METRIX LEVEL 1) Low SOLN Use as directed 04/04/18  Yes [name], NP  ?Blood Glucose Monitoring Suppl (TRUE METRIX AIR GLUCOSE METER) DEVI 1 Device by Does not apply route 2 (two) times daily. 04/13/18  Yes [name], NP  ?carvedilol (COREG) 12.5 MG tablet Take 1 tablet (12.5 mg total) by mouth 2 (two) times daily. 01/07/21 01/07/22 Yes Arta Silence, MD  ?cetirizine (ZYRTEC) 10 MG tablet Take 10 mg by mouth daily as needed for allergies.   Yes [provider]  ?furosemide (LASIX) 40 MG tablet Take 1 tablet (40 mg total) by mouth daily. 09/18/21  Yes Sreenath, Sudheer B, MD  ?gabapentin (NEURONTIN) 100 MG capsule Take 1 capsule (100 mg total) by mouth in the morning, at noon, and at bedtime. 08/11/21  Yes Abernathy, Yetta Flock, NP  ?glimepiride (AMARYL) 2 MG tablet Take 1 tablet (2 mg total) by mouth in the morning and at bedtime. Take one tab po qd with supper 09/09/21  Yes Alisa Graff, FNP  ?glucose blood (ACCU-CHEK GUIDE) test strip Use to check blood sugars 4 times a day   E11.65 08/20/21  Yes Abernathy, Alyssa, NP  ?glucose blood (TRUE METRIX BLOOD GLUCOSE TEST) test strip Use as instructed twice daily diag E11.65 07/30/19  Yes Boscia, Heather E, NP  ?hydrALAZINE (APRESOLINE) 10 MG tablet Take 1 tablet (10 mg total)  by mouth 3 (three) times daily. 09/09/21  Yes Alisa Graff, FNP  ?insulin glargine, 1 Unit Dial, (TOUJEO) 300 UNIT/ML Solostar Pen Inject 20 Units into the skin daily. Reduced from prior 26 units daily. ?Patient taking differently: Inject 30 Units into the skin daily. 08/08/21  Yes Swayze, Ava, DO  ?ipratropium-albuterol (DUONEB) 0.5-2.5 (3) MG/3ML SOLN Take 3 mLs by nebulization every 6 (six) hours as needed. 06/29/21  Yes Danford, Suann Larry, MD  ?lidocaine (LIDODERM) 5 % Place 1 patch onto the skin daily. Remove & Discard patch within 12 hours or as directed by MD 09/19/21  Yes Sidney Ace, MD  ?NOVOFINE PEN NEEDLE 32G X 6 MM MISC Use as directed 10/15/20  Yes Lavera Guise, MD  ?omeprazole (PRILOSEC) 40 MG capsule Take 1 capsule (40 mg total) by mouth in the morning and at bedtime. 08/25/21  Yes Abernathy, Yetta Flock, NP  ?potassium chloride SA (KLOR-CON M) 20 MEQ tablet Take 1 tablet (20 mEq total) by mouth 2 (two) times daily. 09/10/21  Yes Alisa Graff, FNP  ?rosuvastatin (CRESTOR) 10 MG tablet Take 1 tablet (10 mg total)  by mouth at bedtime. 08/11/21  Yes [name], NP  ?TRUEplus Lancets 33G MISC Use as directed twice daily diag E11.65 07/30/19  Yes Boscia, Greer Ee, NP  ?vitamin B-12 1000 MCG tablet Take 1 tablet (1,000 mcg

## 2021-10-05 NOTE — Progress Notes (Signed)
Patient came over from the heart failure clinic for Lasix injection and potassium.  Labs were drawn.  IV Lasix and potassium given with no problems.  Patient is transported by [employer].  I spoke to [facility] to let them know he was ready for pick up and they acknowledged.  I took him down to the door and he insisted on sitting on the bench outside waiting for them to come.   ?

## 2021-10-05 NOTE — Patient Instructions (Addendum)
Continue weighing daily and call for an overnight weight gain of 3 pounds or more or a weekly weight gain of more than 5 pounds. ? ? ?If you have voicemail, please make sure your mailbox is cleaned out so that we may leave a message and please make sure to listen to any voicemails.  ? ? ? ?Get compression socks and put them on every day with removal at bedtime ?

## 2021-10-07 ENCOUNTER — Telehealth (HOSPITAL_COMMUNITY): Payer: Self-pay

## 2021-10-07 ENCOUNTER — Telehealth: Payer: Self-pay

## 2021-10-07 NOTE — Telephone Encounter (Signed)
Pt called that his leg are swelling and he called EMS and advised him to elevated his leg advised him to call his cardiology due to they manage their medicine and also advised if symptoms go to ED or need appt to to see in office  ?

## 2021-10-07 NOTE — Telephone Encounter (Signed)
Today Larry Callahan has called me a few times.  He states spent most of day at his sisters with his feet down. He did have socks on.  He had IV lasix at HF clinic this week. He is 5 lbs down today.  He states his feet are swollen.  Advised him to put socks on and elevate his feet above his heart.  Since he has called pulmonology and told them.  They advised him to contact cardiology or go to ED.  He denies any shortness of breath or chest pain.  Consulted with Larry Callahan with HF clinic and she advised same thing.  Called Larry Callahan back and advised him again and he appears to understand what I have advised him.   ? ?Larry Callahan ?[place] EMT-Paramedic ?[phone] ?

## 2021-10-11 ENCOUNTER — Other Ambulatory Visit: Payer: Self-pay

## 2021-10-11 ENCOUNTER — Encounter: Payer: Self-pay | Admitting: Internal Medicine

## 2021-10-11 ENCOUNTER — Other Ambulatory Visit: Payer: Self-pay | Admitting: Nurse Practitioner

## 2021-10-11 ENCOUNTER — Telehealth (INDEPENDENT_AMBULATORY_CARE_PROVIDER_SITE_OTHER): Payer: Medicare PPO | Admitting: Internal Medicine

## 2021-10-11 VITALS — BP 126/80 | HR 72 | Temp 97.8°F | Resp 18 | Ht 71.0 in | Wt 231.0 lb

## 2021-10-11 DIAGNOSIS — J449 Chronic obstructive pulmonary disease, unspecified: Secondary | ICD-10-CM

## 2021-10-11 DIAGNOSIS — K219 Gastro-esophageal reflux disease without esophagitis: Secondary | ICD-10-CM

## 2021-10-11 DIAGNOSIS — I5022 Chronic systolic (congestive) heart failure: Secondary | ICD-10-CM | POA: Diagnosis not present

## 2021-10-11 DIAGNOSIS — I10 Essential (primary) hypertension: Secondary | ICD-10-CM | POA: Diagnosis not present

## 2021-10-11 DIAGNOSIS — E1122 Type 2 diabetes mellitus with diabetic chronic kidney disease: Secondary | ICD-10-CM

## 2021-10-11 DIAGNOSIS — Z794 Long term (current) use of insulin: Secondary | ICD-10-CM | POA: Diagnosis not present

## 2021-10-11 DIAGNOSIS — N1832 Chronic kidney disease, stage 3b: Secondary | ICD-10-CM | POA: Diagnosis not present

## 2021-10-11 MED ORDER — HYDRALAZINE HCL 50 MG PO TABS: 50.0000 mg | ORAL_TABLET | Freq: Three times a day (TID) | ORAL | 1 refills | Status: DC

## 2021-10-11 MED ORDER — GLIMEPIRIDE 2 MG PO TABS: ORAL_TABLET | ORAL | 3 refills | Status: DC

## 2021-10-11 MED ORDER — OMEPRAZOLE 40 MG PO CPDR: DELAYED_RELEASE_CAPSULE | ORAL | 1 refills | Status: DC

## 2021-10-11 NOTE — Progress Notes (Signed)
[facility] ?[address] ?[place], [place] [address] ? ?Internal MEDICINE  ?Telephone Visit ? ?Patient Name: Larry Callahan ? [id number]  ?[id number] ? ?Date of Service: 10/11/2021 ? ?I connected with the patient at 1041am  by telephone and verified the patients identity using two identifiers.   ?I discussed the limitations, risks, security and privacy concerns of performing an evaluation and management service by telephone and the availability of in person appointments. I also discussed with the patient that there may be a patient responsible charge related to the service.  The patient expressed understanding and agrees to proceed.   ? ?Chief Complaint  ?Patient presents with  ? Follow-up  ? Diabetes  ?  Per Nurse pt's Blood sugars are improving  ? Hypertension  ? Chronic Kidney Disease  ?  Stage 4  ? congested heart failure  ? COPD  ? Atrial Flutter  ? Hyperlipidemia  ? Coronary Artery Disease  ? Anemia  ? sleep apnea  ? ? ?HPI ?Patient is connected via virtual visit most of the visit is conducted by his nurse Ms. Haywood Lasso who is present with him ?1.  Diabetes is under better control patient is to continue on his Toujeo and glimepiride fasting blood pressure today is 153 his nurse is instructed to check postprandial blood sugar on the days that she is present with him. ?2.  Confusion about his Lasix, he is supposed to be on 40 mg twice a day but patient is not taking the evening dose he thinks it was instructed by the ED physician also has to get up during the evening and he does not like that. ?3.  Confusion about dose of hydralazine there is a prescription for 100 mg 3 times a day and a prescription for 10 mg 3 times daily however the timeline is confusing, blood pressure is improving and the weight is also improving we will make changes accordingly ? ? ?Current Medication: ?Outpatient Encounter Medications as of 10/11/2021  ?Medication Sig  ? Accu-Chek Softclix Lancets lancets Use to check  blood sugars 4 times a day   E11.65  ? amLODipine (NORVASC) 10 MG tablet Take 1 tablet (10 mg total) by mouth daily.  ? apixaban (ELIQUIS) 2.5 MG TABS tablet Take 1 tablet (2.5 mg total) by mouth 2 (two) times daily.  ? bacitracin 500 UNIT/GM ointment Apply 1 application. topically 2 (two) times daily.  ? Blood Glucose Monitoring Suppl (TRUE METRIX AIR GLUCOSE METER) DEVI 1 Device by Does not apply route 2 (two) times daily.  ? Budeson-Glycopyrrol-Formoterol (BREZTRI AEROSPHERE) 160-9-4.8 MCG/ACT AERO Inhale into the lungs. Samples given.... 2 puffs in the morning and 2 puffs at bedtime  ? carvedilol (COREG) 12.5 MG tablet Take 1 tablet (12.5 mg total) by mouth 2 (two) times daily.  ? cetirizine (ZYRTEC) 10 MG tablet Take 10 mg by mouth daily as needed for allergies. Buys OTC  ? furosemide (LASIX) 40 MG tablet Take 1 tablet (40 mg total) by mouth daily. (Patient taking differently: Take 40 mg by mouth daily. 1 tablet (40 mg) by mouth twice a day)  ? gabapentin (NEURONTIN) 100 MG capsule Take 1 capsule (100 mg total) by mouth in the morning, at noon, and at bedtime.  ? glimepiride (AMARYL) 2 MG tablet Take one tab po bid with meals  ? glucose blood (ACCU-CHEK GUIDE) test strip Use to check blood sugars 4 times a day   E11.65  ? hydrALAZINE (APRESOLINE) 50 MG tablet Take 1 tablet (50  mg total) by mouth 3 (three) times daily.  ? insulin glargine, 1 Unit Dial, (TOUJEO) 300 UNIT/ML Solostar Pen Inject 20 Units into the skin daily. Reduced from prior 26 units daily. (Patient taking differently: Inject 30 Units into the skin daily. Pt takes 30 units in the morning before breakfast)  ? ipratropium-albuterol (DUONEB) 0.5-2.5 (3) MG/3ML SOLN Take 3 mLs by nebulization every 6 (six) hours as needed.  ? lidocaine (LIDODERM) 5 % Place 1 patch onto the skin daily. Remove & Discard patch within 12 hours or as directed by MD  ? NOVOFINE PEN NEEDLE 32G X 6 MM MISC Use as directed  ? potassium chloride SA (KLOR-CON M) 20 MEQ tablet  Take 1 tablet (20 mEq total) by mouth 2 (two) times daily.  ? rosuvastatin (CRESTOR) 10 MG tablet Take 1 tablet (10 mg total) by mouth at bedtime.  ? vitamin B-12 1000 MCG tablet Take 1 tablet (1,000 mcg total) by mouth daily.  ? [DISCONTINUED] glimepiride (AMARYL) 2 MG tablet TAKE ONE TABLET DAILY WITH SUPPER. (Patient taking differently: Take 1 tablet in the morning and 1 tablet in the evening with supper)  ? [DISCONTINUED] hydrALAZINE (APRESOLINE) 10 MG tablet Take 1 tablet (10 mg total) by mouth 3 (three) times daily.  ? [DISCONTINUED] omeprazole (PRILOSEC) 40 MG capsule Take 1 capsule (40 mg total) by mouth in the morning and at bedtime.  ? omeprazole (PRILOSEC) 40 MG capsule Take 1 capsule once daily in the morning  ? [DISCONTINUED] Blood Glucose Calibration (TRUE METRIX LEVEL 1) Low SOLN Use as directed (Patient not taking: Reported on 10/11/2021)  ? [DISCONTINUED] budesonide (PULMICORT) 0.25 MG/2ML nebulizer solution Take 2 mLs (0.25 mg total) by nebulization daily for 7 days. (Patient not taking: Reported on 09/23/2021)  ? [DISCONTINUED] glucose blood (TRUE METRIX BLOOD GLUCOSE TEST) test strip Use as instructed twice daily diag E11.65 (Patient not taking: Reported on 10/11/2021)  ? [DISCONTINUED] TRUEplus Lancets 33G MISC Use as directed twice daily diag E11.65 (Patient not taking: Reported on 10/11/2021)  ? ?No facility-administered encounter medications on file as of 10/11/2021.  ? ? ?Surgical History: ?Past Surgical History:  ?Procedure Laterality Date  ? BACK SURGERY    ? CARDIAC CATHETERIZATION    ? CHOLECYSTECTOMY    ? COLONOSCOPY WITH PROPOFOL N/A 04/12/2019  ? Procedure: COLONOSCOPY WITH PROPOFOL;  Surgeon: [name], MD;  Location: [facility] ENDOSCOPY;  Service: Gastroenterology;  Laterality: N/A;  ? ESOPHAGOGASTRODUODENOSCOPY N/A 04/12/2019  ? Procedure: ESOPHAGOGASTRODUODENOSCOPY (EGD);  Surgeon: [name], MD;  Location: [facility] ENDOSCOPY;  Service: Gastroenterology;  Laterality: N/A;  ?  ESOPHAGOGASTRODUODENOSCOPY (EGD) WITH PROPOFOL N/A 11/21/2019  ? Procedure: ESOPHAGOGASTRODUODENOSCOPY (EGD) WITH PROPOFOL;  Surgeon: [name], MD;  Location: [facility] ENDOSCOPY;  Service: Gastroenterology;  Laterality: N/A;  ? FLEXIBLE BRONCHOSCOPY Bilateral 05/17/2019  ? Procedure: FLEXIBLE BRONCHOSCOPY;  Surgeon: [name], MD;  Location: [name] ORS;  Service: Pulmonary;  Laterality: Bilateral;  ? left arm surgery    ? nephrectomy Left   ? PARATHYROIDECTOMY    ? RIGHT HEART CATH N/A 03/29/2019  ? Procedure: RIGHT HEART CATH;  Surgeon: [name], MD;  Location: [place] [facility] CV LAB;  Service: Cardiovascular;  Laterality: N/A;  ? ? ?Medical History: ?Past Medical History:  ?Diagnosis Date  ? Allergy   ? Atrial flutter ([place])   ? a. Dx 12/2020--CHA2DS2VASc = 5-->eliquis.  ? Chronic combined systolic (congestive) and diastolic (congestive) heart failure (HCC)   ? a. 04/2019 Echo: EF 45-50%; b. 02/2019 Echo: EF  55-60%; c. 09/2020 Echo: EF 35-40%, glob HK. Nl RV size/fxn. Mild MR; d. 07/2021 Echo: EF 40-45%, mild LVH, low-nl RV fxn, no MR.  ? CKD (chronic kidney disease), stage III ([place])   ? COPD (chronic obstructive pulmonary disease) ([place])   ? Coronary artery disease   ? a. 2011 Cath: nonobs dzs; b. 09/2020 MV: EF 38%. No signif ischemia. ? inf MI vs diaph attenuation. GI uptake artifact.  ? Diabetes mellitus without complication ([place])   ? GERD (gastroesophageal reflux disease)   ? Hyperlipidemia   ? Hypertension   ? Morbid obesity ([place])   ? NICM (nonischemic cardiomyopathy) ([place])   ? a. 04/2019 Echo: EF 45-50%; b. 02/2019 Echo: EF 55-60%; c. 09/2020 Echo: EF 35-40%, glob HK; d. 09/2020 MV: No ischemia. ? inf infarct vs attenuation; d. 07/2021 Echo: EF 40-45%.  ? OSA (obstructive sleep apnea)   ? Stage 3b chronic kidney disease ([place])   ? ? ?Family History: ?Family History  ?Problem Relation Age of Onset  ? Diabetes Mother   ?     2003  ? Lung cancer Father   ? Diabetes Sister   ? Hypertension Sister   ? Heart  disease Sister   ? Cancer Sister   ? Bone cancer Brother   ? ? ?Social History  ? ?Socioeconomic History  ? Marital status: Single  ?  Spouse name: Not on file  ? Number of children: Not on file  ? Years of

## 2021-10-12 ENCOUNTER — Other Ambulatory Visit (HOSPITAL_COMMUNITY): Payer: Self-pay

## 2021-10-13 ENCOUNTER — Encounter (HOSPITAL_COMMUNITY): Payer: Self-pay

## 2021-10-13 NOTE — Telephone Encounter (Signed)
Attempted to schedule . Lmov to call office.  ?

## 2021-10-13 NOTE — Progress Notes (Signed)
Had a home visit with Jeneen Rinks.  He states doing better, swelling has went down but will get worse in evening.  But goes down overnight.  He sits in an office chair when home, he states couch is too low for him to get off of.  Will work to see if there is a way to get a recliner for him, he states would sit in one.  He is weighing daily but on carpet.  Moved his scale to dining room on flat surface.  Will make him some paper to write his weight down on.  He has all his medications and his home health has placed them in a box for him.  He is aware of when to take them.  Discussed diet and fluids.  Lungs are clear.  He denies any problems today such as chest pain, headaches, dizziness or increased shortness of breath.  Will continue to visit for heart failure, diet and medication management.  ? ?Titilayo Hagans ?[place] EMT-Paramedic ?[phone] ?

## 2021-10-13 NOTE — Progress Notes (Signed)
Dropped [place] off some weigh calendars.  He was not home, left in door.   ? ?Arlicia Paquette ?[place] EMT-Paramedic ?[phone] ?

## 2021-10-19 NOTE — Telephone Encounter (Signed)
Unable to reach after several attempts.  Lmov. Closing encounter.  ?

## 2021-10-20 DIAGNOSIS — J441 Chronic obstructive pulmonary disease with (acute) exacerbation: Secondary | ICD-10-CM | POA: Diagnosis not present

## 2021-10-20 DIAGNOSIS — E1165 Type 2 diabetes mellitus with hyperglycemia: Secondary | ICD-10-CM | POA: Diagnosis not present

## 2021-10-20 DIAGNOSIS — I5042 Chronic combined systolic (congestive) and diastolic (congestive) heart failure: Secondary | ICD-10-CM | POA: Diagnosis not present

## 2021-10-20 DIAGNOSIS — E1122 Type 2 diabetes mellitus with diabetic chronic kidney disease: Secondary | ICD-10-CM | POA: Diagnosis not present

## 2021-10-20 DIAGNOSIS — D631 Anemia in chronic kidney disease: Secondary | ICD-10-CM | POA: Diagnosis not present

## 2021-10-22 ENCOUNTER — Other Ambulatory Visit: Payer: Self-pay

## 2021-10-22 NOTE — Patient Outreach (Signed)
[facility]) Care Management ? ?10/22/2021 ? ?Larry Callahan ?05/15/1956 ?[id number] ? ? ?Transition of Care Referral ?  ?Referral Date: 09/27/2021 ?Referral [facility] Liaison ?Date of Discharge: 09/25/2021 ?Facility:[name] ? ? ?Multiple attempts to establish contact with patient without success. No response from letter mailed to patient. Case is being closed at this time.  ? ? ?Plan: ?RN CM will close case. ? ?Enzo Montgomery, RN,BSN,CCM ?[facility] Care Management ?Telephonic Care Management Coordinator ?Direct Phone: [phone] ?Toll Free: [phone] ?Fax: [phone] ? ?

## 2021-10-26 ENCOUNTER — Encounter: Payer: Self-pay | Admitting: Internal Medicine

## 2021-10-26 ENCOUNTER — Telehealth: Payer: Self-pay

## 2021-10-26 NOTE — Telephone Encounter (Signed)
Lvm letting patient know he has a virtual appointment 10/28/21 @ 2:00 pm-[name] ?

## 2021-10-28 ENCOUNTER — Telehealth: Payer: Medicare PPO | Admitting: Internal Medicine

## 2021-11-02 ENCOUNTER — Telehealth (INDEPENDENT_AMBULATORY_CARE_PROVIDER_SITE_OTHER): Payer: Medicare PPO | Admitting: Internal Medicine

## 2021-11-02 ENCOUNTER — Encounter: Payer: Self-pay | Admitting: Internal Medicine

## 2021-11-02 ENCOUNTER — Telehealth: Payer: Self-pay

## 2021-11-02 VITALS — BP 130/78 | HR 65 | Temp 97.4°F | Resp 18 | Ht 71.0 in | Wt 250.0 lb

## 2021-11-02 DIAGNOSIS — E1122 Type 2 diabetes mellitus with diabetic chronic kidney disease: Secondary | ICD-10-CM

## 2021-11-02 DIAGNOSIS — I5022 Chronic systolic (congestive) heart failure: Secondary | ICD-10-CM | POA: Diagnosis not present

## 2021-11-02 DIAGNOSIS — Z794 Long term (current) use of insulin: Secondary | ICD-10-CM

## 2021-11-02 DIAGNOSIS — N1832 Chronic kidney disease, stage 3b: Secondary | ICD-10-CM

## 2021-11-02 DIAGNOSIS — I10 Essential (primary) hypertension: Secondary | ICD-10-CM | POA: Diagnosis not present

## 2021-11-02 MED ORDER — FUROSEMIDE 40 MG PO TABS: ORAL_TABLET | ORAL | 3 refills | Status: DC

## 2021-11-02 MED ORDER — TOUJEO MAX SOLOSTAR 300 UNIT/ML ~~LOC~~ SOPN: PEN_INJECTOR | SUBCUTANEOUS | 3 refills | Status: DC

## 2021-11-02 NOTE — Telephone Encounter (Signed)
Lvm to schedule 4 week follow up-[name]

## 2021-11-02 NOTE — Progress Notes (Signed)
[facility] [place], [place] [address]  Internal MEDICINE  Telephone Visit  Patient Name: Larry Callahan  [id number]  [id number]  Date of Service: 11/16/2021  I connected with the patient at 958 am  by telephone and verified the patients identity using two identifiers.   I discussed the limitations, risks, security and privacy concerns of performing an evaluation and management service by telephone and the availability of in person appointments. I also discussed with the patient that there may be a patient responsible charge related to the service.  The patient expressed understanding and agrees to proceed.    Chief Complaint  Patient presents with   Telephone Assessment    [phone]   Telephone Screen    Labs follow up   Diabetes   Hypertension   Hyperlipidemia   Numbness    Right side of neck     HPI  Patient is connected via televisit through home health nurse He is feeling better had an episode of numbness and got scared and wants to go to ED for checkup when he thought may be having a stroke. He is symptom free at the moment denies any weakness difficulty walking or any speech difficulty Multiple adjustments in his medications were made on previous visit his blood pressure seems to be better and diabetic numbers are also improved   Current Medication: Outpatient Encounter Medications as of 11/02/2021  Medication Sig   Accu-Chek Softclix Lancets lancets Use to check blood sugars 4 times a day   E11.65   amLODipine (NORVASC) 10 MG tablet Take 1 tablet (10 mg total) by mouth daily.   apixaban (ELIQUIS) 2.5 MG TABS tablet Take 1 tablet (2.5 mg total) by mouth 2 (two) times daily.   bacitracin 500 UNIT/GM ointment Apply 1 application. topically 2 (two) times daily. (Patient not taking: Reported on 11/04/2021)   Blood Glucose Monitoring Suppl (TRUE METRIX AIR GLUCOSE METER) DEVI 1 Device by Does not apply route 2 (two) times daily.    Budeson-Glycopyrrol-Formoterol (BREZTRI AEROSPHERE) 160-9-4.8 MCG/ACT AERO Inhale into the lungs. Samples given.... 2 puffs in the morning and 2 puffs at bedtime   carvedilol (COREG) 12.5 MG tablet Take 1 tablet (12.5 mg total) by mouth 2 (two) times daily.   cetirizine (ZYRTEC) 10 MG tablet Take 10 mg by mouth daily as needed for allergies. Buys OTC   furosemide (LASIX) 40 MG tablet Take 2 tabs M/W/F AND one tab rest of the week   gabapentin (NEURONTIN) 100 MG capsule Take 1 capsule (100 mg total) by mouth in the morning, at noon, and at bedtime.   glimepiride (AMARYL) 2 MG tablet Take one tab po bid with meals   glucose blood (ACCU-CHEK GUIDE) test strip Use to check blood sugars 4 times a day   E11.65   hydrALAZINE (APRESOLINE) 50 MG tablet Take 1 tablet (50 mg total) by mouth 3 (three) times daily.   insulin glargine, 2 Unit Dial, (TOUJEO MAX SOLOSTAR) 300 UNIT/ML Solostar Pen Inject 20 units in am   ipratropium-albuterol (DUONEB) 0.5-2.5 (3) MG/3ML SOLN Take 3 mLs by nebulization every 6 (six) hours as needed.   lidocaine (LIDODERM) 5 % Place 1 patch onto the skin daily. Remove & Discard patch within 12 hours or as directed by MD   NOVOFINE PEN NEEDLE 32G X 6 MM MISC Use as directed   omeprazole (PRILOSEC) 40 MG capsule Take 1 capsule once in the morning and once in the evening   potassium chloride SA (  KLOR-CON M) 20 MEQ tablet Take 1 tablet (20 mEq total) by mouth 2 (two) times daily.   rosuvastatin (CRESTOR) 10 MG tablet Take 1 tablet (10 mg total) by mouth at bedtime.   vitamin B-12 1000 MCG tablet Take 1 tablet (1,000 mcg total) by mouth daily.   [DISCONTINUED] furosemide (LASIX) 40 MG tablet Take 1 tablet (40 mg total) by mouth daily. (Patient taking differently: Take 40 mg by mouth daily. 1 tablet (40 mg) by mouth twice a day)   [DISCONTINUED] insulin glargine, 1 Unit Dial, (TOUJEO) 300 UNIT/ML Solostar Pen Inject 20 Units into the skin daily. Reduced from prior 26 units daily. (Patient  taking differently: Inject 30 Units into the skin daily. Pt takes 30 units in the morning before breakfast)   No facility-administered encounter medications on file as of 11/02/2021.    Surgical History: Past Surgical History:  Procedure Laterality Date   BACK SURGERY     CARDIAC CATHETERIZATION     CHOLECYSTECTOMY     COLONOSCOPY WITH PROPOFOL N/A 04/12/2019   Procedure: COLONOSCOPY WITH PROPOFOL;  Surgeon: [name], MD;  Location: [facility] ENDOSCOPY;  Service: Gastroenterology;  Laterality: N/A;   ESOPHAGOGASTRODUODENOSCOPY N/A 04/12/2019   Procedure: ESOPHAGOGASTRODUODENOSCOPY (EGD);  Surgeon: [name], MD;  Location: [facility] ENDOSCOPY;  Service: Gastroenterology;  Laterality: N/A;   ESOPHAGOGASTRODUODENOSCOPY (EGD) WITH PROPOFOL N/A 11/21/2019   Procedure: ESOPHAGOGASTRODUODENOSCOPY (EGD) WITH PROPOFOL;  Surgeon: [name], MD;  Location: [facility] ENDOSCOPY;  Service: Gastroenterology;  Laterality: N/A;   FLEXIBLE BRONCHOSCOPY Bilateral 05/17/2019   Procedure: FLEXIBLE BRONCHOSCOPY;  Surgeon: [name], MD;  Location: [name] ORS;  Service: Pulmonary;  Laterality: Bilateral;   left arm surgery     nephrectomy Left    PARATHYROIDECTOMY     RIGHT HEART CATH N/A 03/29/2019   Procedure: RIGHT HEART CATH;  Surgeon: [name], MD;  Location: [facility] CV LAB;  Service: Cardiovascular;  Laterality: N/A;    Medical History: Past Medical History:  Diagnosis Date   Allergy    Atrial flutter ([place])    a. Dx 12/2020--CHA2DS2VASc = 5-->eliquis.   Chronic combined systolic (congestive) and diastolic (congestive) heart failure ([place])    a. 04/2019 Echo: EF 45-50%; b. 02/2019 Echo: EF 55-60%; c. 09/2020 Echo: EF 35-40%, glob HK. Nl RV size/fxn. Mild MR; d. 07/2021 Echo: EF 40-45%, mild LVH, low-nl RV fxn, no MR.   CKD (chronic kidney disease), stage III (HCC)    COPD (chronic obstructive pulmonary disease) ([place])    Coronary artery disease    a. 2011 Cath: nonobs  dzs; b. 09/2020 MV: EF 38%. No signif ischemia. ? inf MI vs diaph attenuation. GI uptake artifact.   Diabetes mellitus without complication ([place])    GERD (gastroesophageal reflux disease)    Hyperlipidemia    Hypertension    Morbid obesity ([place])    NICM (nonischemic cardiomyopathy) ([place])    a. 04/2019 Echo: EF 45-50%; b. 02/2019 Echo: EF 55-60%; c. 09/2020 Echo: EF 35-40%, glob HK; d. 09/2020 MV: No ischemia. ? inf infarct vs attenuation; d. 07/2021 Echo: EF 40-45%.   OSA (obstructive sleep apnea)    Stage 3b chronic kidney disease ([place])     Family History: Family History  Problem Relation Age of Onset   Diabetes Mother        2003   Lung cancer Father    Diabetes Sister    Hypertension Sister    Heart disease Sister    Cancer Sister    Bone  cancer Brother     Social History   Socioeconomic History   Marital status: Single    Spouse name: Not on file   Number of children: Not on file   Years of education: Not on file   Highest education level: Not on file  Occupational History   Not on file  Tobacco Use   Smoking status: Former    Types: Cigarettes   Smokeless tobacco: Former    Types: Nurse, children's Use: Never used  Substance and Sexual Activity   Alcohol use: No   Drug use: No   Sexual activity: Not Currently  Other Topics Concern   Not on file  Social History Narrative   Not on file   Social Determinants of Health   Financial Resource Strain: Low Risk    Difficulty of Paying Living Expenses: Not very hard  Food Insecurity: Not on file  Transportation Needs: Not on file  Physical Activity: Not on file  Stress: Not on file  Social Connections: Not on file  Intimate Partner Violence: Not on file      Review of Systems  Constitutional:  Negative for fatigue and fever.  HENT:  Negative for congestion, mouth sores and postnasal drip.   Respiratory:  Negative for cough.   Cardiovascular:  Negative for chest pain.  Genitourinary:  Negative for  flank pain.  Neurological:        Symptom-free at the moment  Psychiatric/Behavioral: Negative.     Vital Signs: BP 130/78   Pulse 65   Temp (!) 97.4 F (36.3 C)   Resp 18   Ht _0  (1.803 m)   Wt 250 lb (113.4 kg)   BMI 34.87 kg/m    Observation/Objective: Patient is under no distress    Assessment/Plan: 1. Chronic systolic CHF (congestive heart failure) ([place]) Patient is stable his weight is under control he will continue take Lasix to 20 mg 2 tablets in the morning Monday Wednesday Friday followed by just 1 tablet in the morning rest of the week patient should also be on Entresto - Basic Metabolic Panel (BMET)  2. Type 2 diabetes mellitus with stage 3b chronic kidney disease, with long-term current use of insulin (HCC) Continue glimepiride and Toujeo as before - Ambulatory referral to Ophthalmology  3. Benign hypertension Patient is on hydralazine 50 mg 3 times a day, patient is also seen by heart failure clinic  General Counseling: jaidyn kuhl understanding of the findings of today's phone visit and agrees with plan of treatment. I have discussed any further diagnostic evaluation that may be needed or ordered today. We also reviewed his medications today. he has been encouraged to call the office with any questions or concerns that should arise related to todays visit.    Orders Placed This Encounter  Procedures   Basic Metabolic Panel (BMET)   Ambulatory referral to Ophthalmology    Meds ordered this encounter  Medications   insulin glargine, 2 Unit Dial, (TOUJEO MAX SOLOSTAR) 300 UNIT/ML Solostar Pen    Sig: Inject 20 units in am    Dispense:  9 mL    Refill:  3   furosemide (LASIX) 40 MG tablet    Sig: Take 2 tabs M/W/F AND one tab rest of the week    Dispense:  30 tablet    Refill:  3    Time spent:25 Minutes    Dr Lavera Guise Internal medicine

## 2021-11-04 ENCOUNTER — Ambulatory Visit: Payer: Medicare PPO | Attending: Family | Admitting: Family

## 2021-11-04 ENCOUNTER — Encounter: Payer: Self-pay | Admitting: Family

## 2021-11-04 VITALS — BP 142/82 | HR 63 | Resp 18 | Ht 71.0 in | Wt 255.0 lb

## 2021-11-04 DIAGNOSIS — I130 Hypertensive heart and chronic kidney disease with heart failure and stage 1 through stage 4 chronic kidney disease, or unspecified chronic kidney disease: Secondary | ICD-10-CM | POA: Insufficient documentation

## 2021-11-04 DIAGNOSIS — Z79899 Other long term (current) drug therapy: Secondary | ICD-10-CM | POA: Insufficient documentation

## 2021-11-04 DIAGNOSIS — I890 Lymphedema, not elsewhere classified: Secondary | ICD-10-CM | POA: Diagnosis not present

## 2021-11-04 DIAGNOSIS — I5020 Unspecified systolic (congestive) heart failure: Secondary | ICD-10-CM

## 2021-11-04 DIAGNOSIS — Z794 Long term (current) use of insulin: Secondary | ICD-10-CM | POA: Diagnosis not present

## 2021-11-04 DIAGNOSIS — I10 Essential (primary) hypertension: Secondary | ICD-10-CM

## 2021-11-04 DIAGNOSIS — E1122 Type 2 diabetes mellitus with diabetic chronic kidney disease: Secondary | ICD-10-CM | POA: Insufficient documentation

## 2021-11-04 DIAGNOSIS — G4733 Obstructive sleep apnea (adult) (pediatric): Secondary | ICD-10-CM | POA: Diagnosis not present

## 2021-11-04 DIAGNOSIS — I5022 Chronic systolic (congestive) heart failure: Secondary | ICD-10-CM | POA: Insufficient documentation

## 2021-11-04 DIAGNOSIS — N189 Chronic kidney disease, unspecified: Secondary | ICD-10-CM | POA: Insufficient documentation

## 2021-11-04 DIAGNOSIS — N184 Chronic kidney disease, stage 4 (severe): Secondary | ICD-10-CM

## 2021-11-04 IMAGING — CR DG CHEST 2V
1 series · 2 of 2 positions shown · non-contrast
Comparison: 02/18/2020

CLINICAL DATA: Right chest pain

EXAM:
CHEST - 2 VIEW

[Series 1: dg chest 2 view · 0.14mm/px · 2 of 2 slices shown]
[im 1/2]
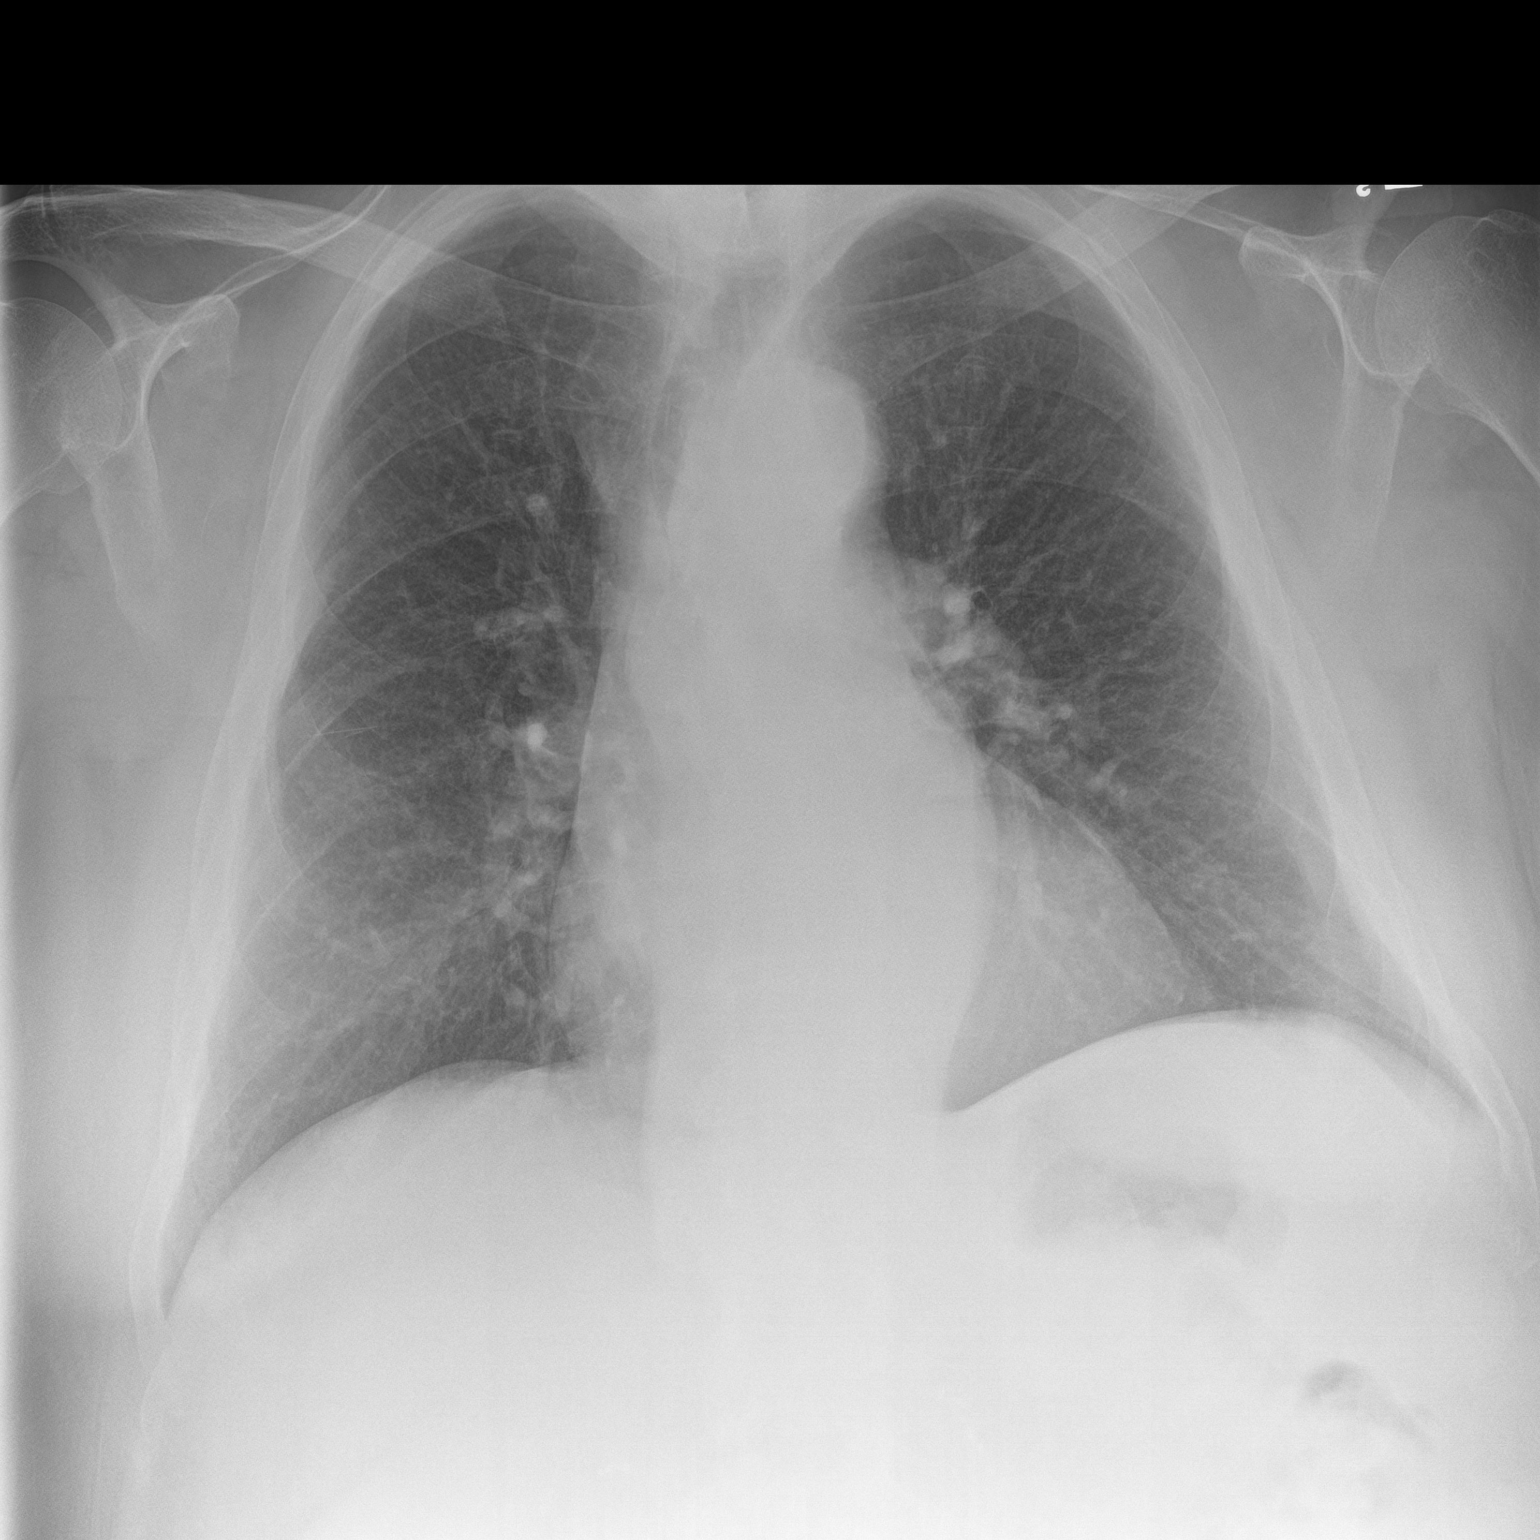
[im 2/2]
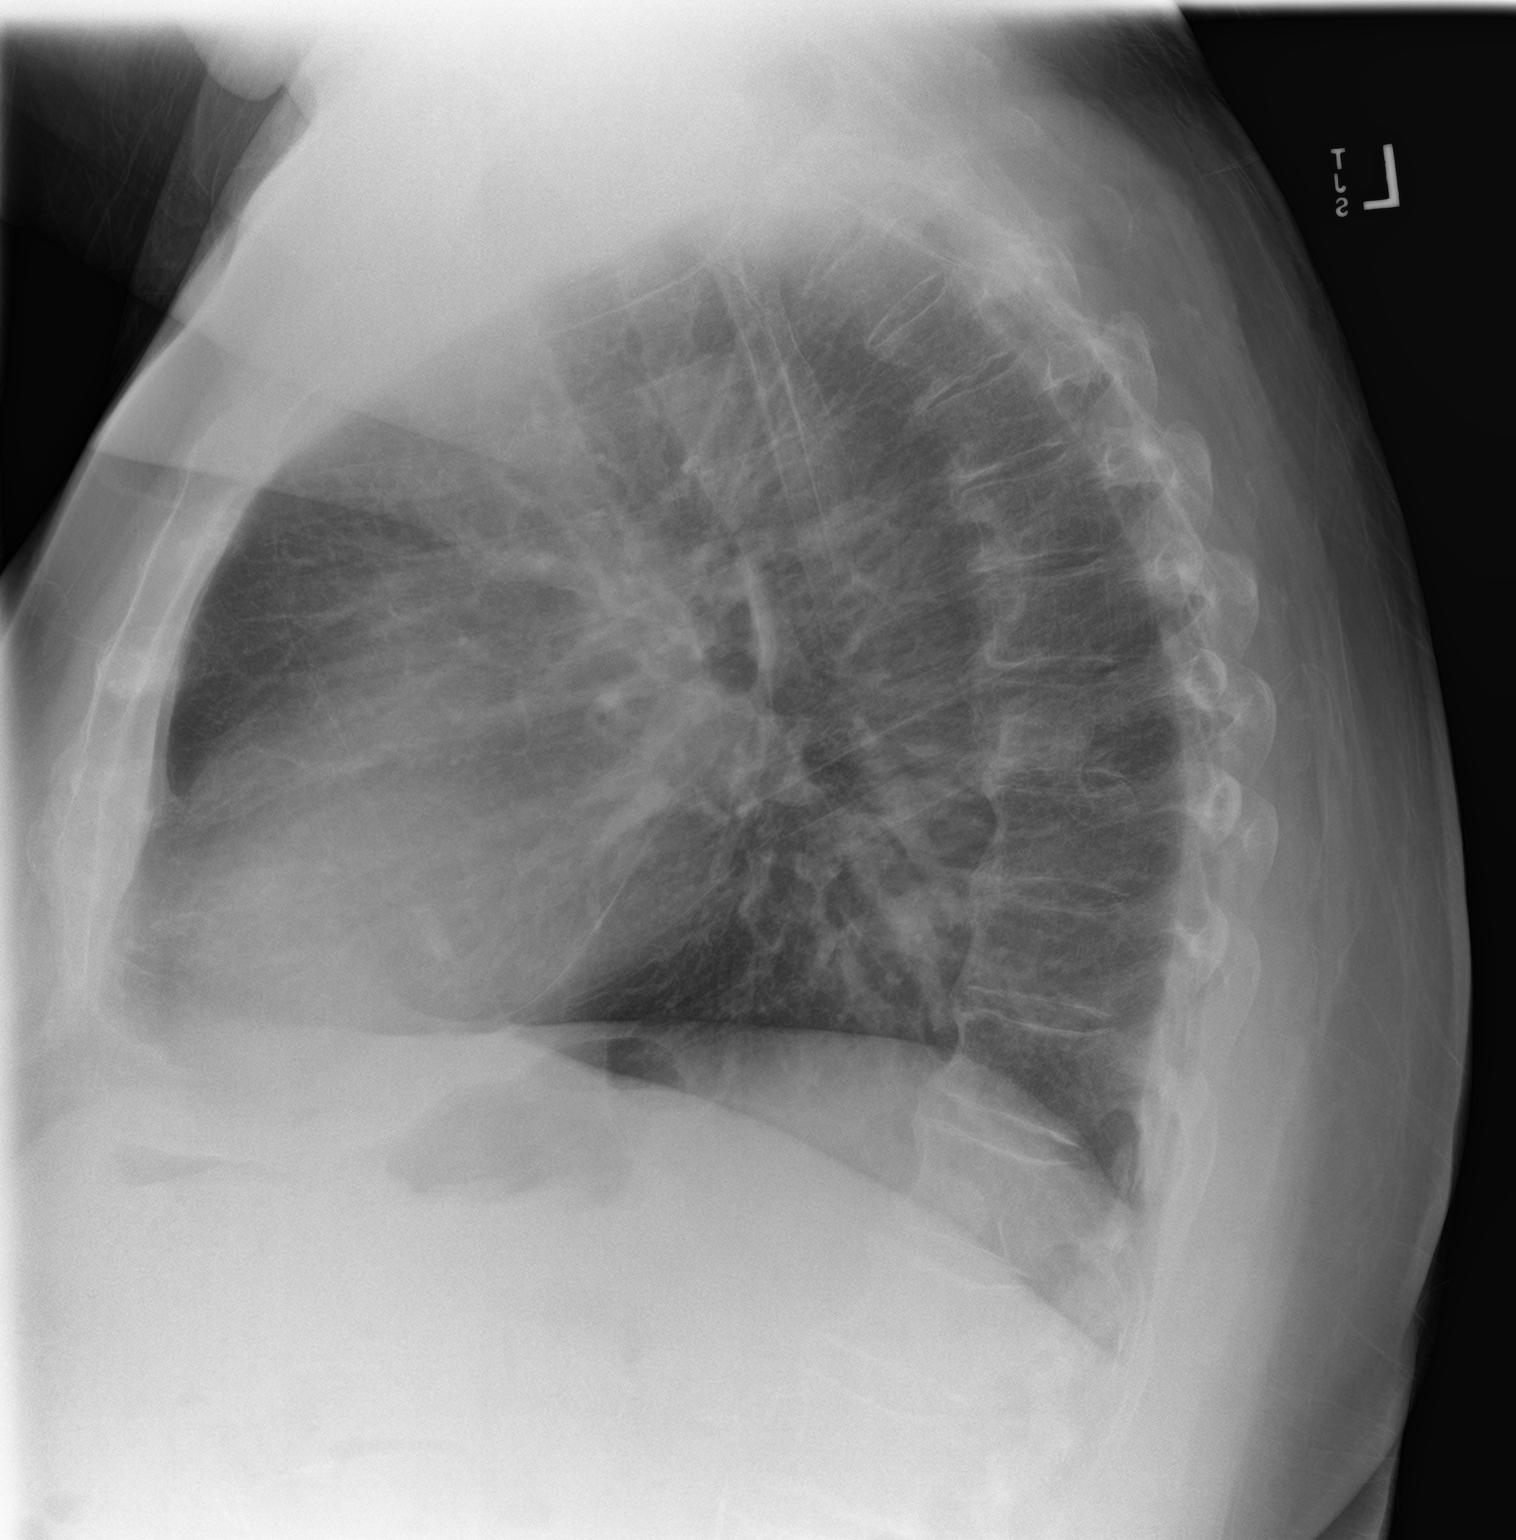

[2 of 2 positions shown; findings below may reference images not displayed]

FINDINGS: Lungs are well expanded, symmetric, and clear. No pneumothorax or
pleural effusion. Cardiac size within normal limits. Pulmonary
vascularity is normal. Osseous structures are age-appropriate. No
acute bone abnormality.
IMPRESSION: No active cardiopulmonary disease.

## 2021-11-04 MED ORDER — SACUBITRIL-VALSARTAN 24-26 MG PO TABS: 1.0000 | ORAL_TABLET | Freq: Two times a day (BID) | ORAL | 3 refills | Status: DC

## 2021-11-04 NOTE — Progress Notes (Signed)
Patient ID: Larry Callahan, male    DOB: May 25, 1956, 66 y.o.   MRN: [id number]  HPI  Larry Callahan is a 66 yo male with a PMHx of nonischemic cardiomyopathy last EF measured in April 2022 was 69 to 40%, COPD, sleep apnea, chronic kidney disease stage IV, chronic anemia, hypertension, and diabetes mellitus.  Echo from 08/08/21 reviewed and showed and EF of 40-45%. There is mild to moderately decreased function of the left ventricle. The diastolic parameters are indeterminate. The RV systolic function is low normal.   RHC done 03/29/2019 and showed: Pulmonary capillary wedge pressure mean of 8 PA: 32/16 mean 23 RA mean of 10 RV 26/12/13  Cardiac output 8.17 Cardiac index 3.42 Final Conclusions:   Normal right heart pressures  Admitted 09/23/21 due to weakness and left-sided chest pain. PT/OT evaluations done. Cardiology consult obtained. Troponins unremarkable. Discharged after 2 days. Was in the ED 09/21/21 due to hand laceration. Sutures placed and he was released. Admitted 09/16/21 due to sudden chest pain. Cardiology consult obtained. NSTEMI ruled out. Furosemide decreased to daily with stoppage of chlorthalidone. Discharged in 2 days. Admitted 2/23-2/26/23 with worsening SOB, and orthopnea, diuresed 4.9 liters and ultimately d/c'd home with [facility] RN services.   He presents for a follow-up visit with a chief complaint of minimal fatigue upon moderate exertion. He describes this as chronic in nature. He has associated cough, shortness of breath, pedal edema and anxiety along with this. He denies any dizziness, difficulty sleeping, abdominal distention, palpitations, chest pain or weight gain.   Past Medical History:  Diagnosis Date   Allergy    Atrial flutter ([place])    a. Dx 12/2020--CHA2DS2VASc = 5-->eliquis.   Chronic combined systolic (congestive) and diastolic (congestive) heart failure ([place])    a. 04/2019 Echo: EF 45-50%; b. 02/2019 Echo: EF 55-60%; c. 09/2020 Echo: EF 35-40%, glob HK.  Nl RV size/fxn. Mild MR; d. 07/2021 Echo: EF 40-45%, mild LVH, low-nl RV fxn, no MR.   CKD (chronic kidney disease), stage III (HCC)    COPD (chronic obstructive pulmonary disease) ([place])    Coronary artery disease    a. 2011 Cath: nonobs dzs; b. 09/2020 MV: EF 38%. No signif ischemia. ? inf MI vs diaph attenuation. GI uptake artifact.   Diabetes mellitus without complication ([place])    GERD (gastroesophageal reflux disease)    Hyperlipidemia    Hypertension    Morbid obesity ([place])    NICM (nonischemic cardiomyopathy) ([place])    a. 04/2019 Echo: EF 45-50%; b. 02/2019 Echo: EF 55-60%; c. 09/2020 Echo: EF 35-40%, glob HK; d. 09/2020 MV: No ischemia. ? inf infarct vs attenuation; d. 07/2021 Echo: EF 40-45%.   OSA (obstructive sleep apnea)    Stage 3b chronic kidney disease ([place])    Past Surgical History:  Procedure Laterality Date   BACK SURGERY     CARDIAC CATHETERIZATION     CHOLECYSTECTOMY     COLONOSCOPY WITH PROPOFOL N/A 04/12/2019   Procedure: COLONOSCOPY WITH PROPOFOL;  Surgeon: [name], MD;  Location: [facility] ENDOSCOPY;  Service: Gastroenterology;  Laterality: N/A;   ESOPHAGOGASTRODUODENOSCOPY N/A 04/12/2019   Procedure: ESOPHAGOGASTRODUODENOSCOPY (EGD);  Surgeon: [name], MD;  Location: [facility] ENDOSCOPY;  Service: Gastroenterology;  Laterality: N/A;   ESOPHAGOGASTRODUODENOSCOPY (EGD) WITH PROPOFOL N/A 11/21/2019   Procedure: ESOPHAGOGASTRODUODENOSCOPY (EGD) WITH PROPOFOL;  Surgeon: [name], MD;  Location: [facility] ENDOSCOPY;  Service: Gastroenterology;  Laterality: N/A;   FLEXIBLE BRONCHOSCOPY Bilateral 05/17/2019   Procedure: FLEXIBLE BRONCHOSCOPY;  Surgeon: Humphrey Rolls,  Rudi Rummage, MD;  Location: [name] ORS;  Service: Pulmonary;  Laterality: Bilateral;   left arm surgery     nephrectomy Left    PARATHYROIDECTOMY     RIGHT HEART CATH N/A 03/29/2019   Procedure: RIGHT HEART CATH;  Surgeon: [name], MD;  Location: [facility] CV LAB;  Service: Cardiovascular;   Laterality: N/A;   Family History  Problem Relation Age of Onset   Diabetes Mother        2003   Lung cancer Father    Diabetes Sister    Hypertension Sister    Heart disease Sister    Cancer Sister    Bone cancer Brother    Social History   Tobacco Use   Smoking status: Former    Types: Cigarettes   Smokeless tobacco: Former    Types: Chew  Substance Use Topics   Alcohol use: No   Allergies  Allergen Reactions   Penicillins Rash   Prior to Admission medications   Medication Sig Start Date End Date Taking? Authorizing Provider  Accu-Chek Softclix Lancets lancets Use to check blood sugars 4 times a day   E11.65 08/20/21  Yes Abernathy, Alyssa, NP  amLODipine (NORVASC) 10 MG tablet Take 1 tablet (10 mg total) by mouth daily. 08/11/21  Yes Abernathy, Yetta Flock, NP  apixaban (ELIQUIS) 2.5 MG TABS tablet Take 1 tablet (2.5 mg total) by mouth 2 (two) times daily. 05/20/21  Yes Abernathy, Yetta Flock, NP  Blood Glucose Monitoring Suppl (TRUE METRIX AIR GLUCOSE METER) DEVI 1 Device by Does not apply route 2 (two) times daily. 04/13/18  Yes Boscia, Greer Ee, NP  Budeson-Glycopyrrol-Formoterol (BREZTRI AEROSPHERE) 160-9-4.8 MCG/ACT AERO Inhale into the lungs. Samples given.... 2 puffs in the morning and 2 puffs at bedtime   Yes [provider]  carvedilol (COREG) 12.5 MG tablet Take 1 tablet (12.5 mg total) by mouth 2 (two) times daily. 01/07/21 01/07/22 Yes Arta Silence, MD  cetirizine (ZYRTEC) 10 MG tablet Take 10 mg by mouth daily as needed for allergies. Buys OTC   Yes [provider]  furosemide (LASIX) 40 MG tablet Take 2 tabs M/W/F AND one tab rest of the week 11/02/21  Yes Lavera Guise, MD  gabapentin (NEURONTIN) 100 MG capsule Take 1 capsule (100 mg total) by mouth in the morning, at noon, and at bedtime. 08/11/21  Yes Abernathy, Yetta Flock, NP  glimepiride (AMARYL) 2 MG tablet Take one tab po bid with meals 10/11/21  Yes Lavera Guise, MD  glucose blood (ACCU-CHEK GUIDE)  test strip Use to check blood sugars 4 times a day   E11.65 08/20/21  Yes Abernathy, Alyssa, NP  hydrALAZINE (APRESOLINE) 50 MG tablet Take 1 tablet (50 mg total) by mouth 3 (three) times daily. 10/11/21  Yes Lavera Guise, MD  insulin glargine, 2 Unit Dial, (TOUJEO MAX SOLOSTAR) 300 UNIT/ML Solostar Pen Inject 20 units in am 11/02/21  Yes Lavera Guise, MD  ipratropium-albuterol (DUONEB) 0.5-2.5 (3) MG/3ML SOLN Take 3 mLs by nebulization every 6 (six) hours as needed. 06/29/21  Yes Danford, Suann Larry, MD  lidocaine (LIDODERM) 5 % Place 1 patch onto the skin daily. Remove & Discard patch within 12 hours or as directed by MD 09/19/21  Yes Sidney Ace, MD  NOVOFINE PEN NEEDLE 32G X 6 MM MISC Use as directed 10/15/20  Yes Lavera Guise, MD  omeprazole (PRILOSEC) 40 MG capsule Take 1 capsule once in the morning and once in the evening 10/11/21  Yes  Lavera Guise, MD  potassium chloride SA (KLOR-CON M) 20 MEQ tablet Take 1 tablet (20 mEq total) by mouth 2 (two) times daily. 09/10/21  Yes Darylene Price A, FNP  rosuvastatin (CRESTOR) 10 MG tablet Take 1 tablet (10 mg total) by mouth at bedtime. 08/11/21  Yes Abernathy, Yetta Flock, NP  vitamin B-12 1000 MCG tablet Take 1 tablet (1,000 mcg total) by mouth daily. 01/16/21  Yes Fritzi Mandes, MD  bacitracin 500 UNIT/GM ointment Apply 1 application. topically 2 (two) times daily. Patient not taking: Reported on 11/04/2021 09/21/21   Victorino Dike, FNP   Review of Systems  Constitutional:  Positive for fatigue. Negative for appetite change.  HENT:  Positive for hearing loss. Negative for congestion and sore throat.   Eyes: Negative.   Respiratory:  Positive for cough and shortness of breath.   Cardiovascular:  Positive for leg swelling (improving). Negative for chest pain and palpitations.  Gastrointestinal:  Negative for abdominal distention and abdominal pain.  Endocrine: Negative.   Genitourinary: Negative.   Musculoskeletal:  Negative for back pain and neck  pain.  Skin: Negative.   Allergic/Immunologic: Negative.   Neurological:  Negative for dizziness and light-headedness.  Hematological:  Negative for adenopathy. Does not bruise/bleed easily.  Psychiatric/Behavioral:  Negative for dysphoric mood and sleep disturbance (sleeping on 1 pillow with CPAP). The patient is nervous/anxious.    Vitals:   11/04/21 1153  BP: (!) 142/82  Pulse: 63  Resp: 18  SpO2: 99%  Weight: 255 lb (115.7 kg)  Height: _0  (1.803 m)   Wt Readings from Last 3 Encounters:  11/04/21 255 lb (115.7 kg)  11/02/21 250 lb (113.4 kg)  10/12/21 254 lb 14.4 oz (115.6 kg)   Lab Results  Component Value Date   CREATININE 2.13 (H) 10/05/2021   CREATININE 2.15 (H) 09/25/2021   CREATININE 2.62 (H) 09/23/2021   Physical Exam Vitals and nursing note reviewed.  Constitutional:      Appearance: Normal appearance.  HENT:     Head: Normocephalic and atraumatic.  Cardiovascular:     Rate and Rhythm: Normal rate and regular rhythm.  Pulmonary:     Effort: Pulmonary effort is normal. No respiratory distress.     Breath sounds: No wheezing or rales.  Abdominal:     General: There is no distension.     Palpations: Abdomen is soft.  Musculoskeletal:        General: No tenderness.     Cervical back: Normal range of motion and neck supple.     Right lower leg: Edema (1+ pitting) present.     Left lower leg: Edema (1+ pitting) present.  Skin:    General: Skin is warm and dry.  Neurological:     General: No focal deficit present.     Mental Status: He is alert and oriented to person, place, and time.  Psychiatric:        Mood and Affect: Mood is anxious.        Behavior: Behavior normal.    Assessment and Plan:  Chronic Heart failure with reduced ejection fraction- - NYHA II - euvolemic today - weighing daily; reminded to call for an overnight weight gain of > 2 pounds or a weekly weight gain of > 5 pounds - weight down 6 pounds from last visit here 1 month ago -  not adding salt to his food - participating in paramedicine program  - saw cardiology Rockey Situ) 11/11/20; needs f/u scheduled - on GDMT coreg -  will add entresto 24/64m BID - check BMP in 2 weeks at PCP visit - hesitant to add MRA as he won't be able to do frequent lab draws  - BNP 10/05/21 was 221.4  2. HTN with CKD- - BP mildly elevated (142/82) - had video visit with PCP (Stephanie Coup 11/02/21 - BMP 10/05/21 reviewed and showed Na 138, K 4.3, Cr 2.13, gfr 33  3. DM-  - A1C 7.0% 08/06/21 - glucose at home today was 130  4: Lymphedema- - reports elevating his legs but edema persists - wearing compression socks daily - is quite active at home - edema has improved from last visit here  5. OSA- - wears CPAP with O2 HS, for "all hours I sleep, including naps"   Medication bottles reviewed.   Return in 1 month, sooner if needed.

## 2021-11-04 NOTE — Patient Instructions (Addendum)
Start weighing daily and call for an overnight weight gain of 3 pounds or more or a weekly weight gain of more than 5 pounds.    When you get your entresto, you will start taking 1 tablet every morning and 1 tablet every evening

## 2021-11-11 ENCOUNTER — Telehealth: Payer: Self-pay

## 2021-11-12 NOTE — Telephone Encounter (Signed)
Alden Benjamin [phone] from [facility] called and informed me that patient fell on Monday and feels like he sprang his left ankle but pt refused to go to the ER.  Alden Benjamin advised that pt seems to be functioning ok and his balance is ok.  I advised to Alden Benjamin that if pt seems to get worse that he will go to ER or make an appt with [name]

## 2021-11-15 ENCOUNTER — Ambulatory Visit: Payer: Medicare PPO | Admitting: Nurse Practitioner

## 2021-11-16 ENCOUNTER — Telehealth: Payer: Self-pay

## 2021-11-16 NOTE — Telephone Encounter (Signed)
Ophthalmology  referral sent via [facility] to [place]

## 2021-11-18 NOTE — Telephone Encounter (Signed)
Referral redirected via [facility] to [facility] Ophthal[facility]-[name]

## 2021-11-23 ENCOUNTER — Telehealth (HOSPITAL_COMMUNITY): Payer: Self-pay

## 2021-11-23 NOTE — Telephone Encounter (Signed)
Larry Callahan contacted me around 7:20 ans advised his blood sugar was 80 and that is too low for him.  He states he ate a tablespoon of peanut butter and drank some diet cola but went down 2 pts now.  He states had some diarrhea and has taken his insulin around 6:30.  Advised him to eat a peanut butter sandwich and recheck in about 30 minutes.  He is alert and speaking in full sentences.  I contacted him back and he states doing better, he ate a sandwich and sugars is up in the 90's now.  Advised him to recheck often today and call back if any problems.   [place] [phone]

## 2021-11-29 ENCOUNTER — Other Ambulatory Visit: Payer: Self-pay | Admitting: Nurse Practitioner

## 2021-11-29 DIAGNOSIS — I10 Essential (primary) hypertension: Secondary | ICD-10-CM

## 2021-11-30 ENCOUNTER — Emergency Department: Payer: Medicare PPO

## 2021-11-30 ENCOUNTER — Other Ambulatory Visit: Payer: Self-pay

## 2021-11-30 ENCOUNTER — Encounter: Payer: Self-pay | Admitting: *Deleted

## 2021-11-30 ENCOUNTER — Telehealth: Payer: Self-pay

## 2021-11-30 ENCOUNTER — Inpatient Hospital Stay
Admission: EM | Admit: 2021-11-30 | Discharge: 2021-12-04 | DRG: 603 | Disposition: A | Discharge status: Home / Self Care | Payer: Medicare PPO | Attending: Internal Medicine | Admitting: Internal Medicine

## 2021-11-30 DIAGNOSIS — Z87891 Personal history of nicotine dependence: Secondary | ICD-10-CM

## 2021-11-30 DIAGNOSIS — J9611 Chronic respiratory failure with hypoxia: Secondary | ICD-10-CM | POA: Diagnosis present

## 2021-11-30 DIAGNOSIS — E785 Hyperlipidemia, unspecified: Secondary | ICD-10-CM | POA: Diagnosis present

## 2021-11-30 DIAGNOSIS — L03213 Periorbital cellulitis: Principal | ICD-10-CM | POA: Diagnosis present

## 2021-11-30 DIAGNOSIS — G4733 Obstructive sleep apnea (adult) (pediatric): Secondary | ICD-10-CM | POA: Diagnosis present

## 2021-11-30 DIAGNOSIS — I2510 Atherosclerotic heart disease of native coronary artery without angina pectoris: Secondary | ICD-10-CM | POA: Diagnosis present

## 2021-11-30 DIAGNOSIS — J449 Chronic obstructive pulmonary disease, unspecified: Secondary | ICD-10-CM | POA: Diagnosis present

## 2021-11-30 DIAGNOSIS — R5381 Other malaise: Secondary | ICD-10-CM | POA: Diagnosis not present

## 2021-11-30 DIAGNOSIS — Z79899 Other long term (current) drug therapy: Secondary | ICD-10-CM

## 2021-11-30 DIAGNOSIS — Z794 Long term (current) use of insulin: Secondary | ICD-10-CM | POA: Diagnosis not present

## 2021-11-30 DIAGNOSIS — I4891 Unspecified atrial fibrillation: Secondary | ICD-10-CM | POA: Diagnosis present

## 2021-11-30 DIAGNOSIS — N184 Chronic kidney disease, stage 4 (severe): Secondary | ICD-10-CM | POA: Diagnosis not present

## 2021-11-30 DIAGNOSIS — E1122 Type 2 diabetes mellitus with diabetic chronic kidney disease: Secondary | ICD-10-CM | POA: Diagnosis present

## 2021-11-30 DIAGNOSIS — Z6835 Body mass index (BMI) 35.0-35.9, adult: Secondary | ICD-10-CM

## 2021-11-30 DIAGNOSIS — Z833 Family history of diabetes mellitus: Secondary | ICD-10-CM

## 2021-11-30 DIAGNOSIS — I130 Hypertensive heart and chronic kidney disease with heart failure and stage 1 through stage 4 chronic kidney disease, or unspecified chronic kidney disease: Secondary | ICD-10-CM | POA: Diagnosis present

## 2021-11-30 DIAGNOSIS — Z7901 Long term (current) use of anticoagulants: Secondary | ICD-10-CM

## 2021-11-30 DIAGNOSIS — E1165 Type 2 diabetes mellitus with hyperglycemia: Secondary | ICD-10-CM

## 2021-11-30 DIAGNOSIS — E1136 Type 2 diabetes mellitus with diabetic cataract: Secondary | ICD-10-CM | POA: Diagnosis present

## 2021-11-30 DIAGNOSIS — K219 Gastro-esophageal reflux disease without esophagitis: Secondary | ICD-10-CM | POA: Diagnosis present

## 2021-11-30 DIAGNOSIS — Z7951 Long term (current) use of inhaled steroids: Secondary | ICD-10-CM

## 2021-11-30 DIAGNOSIS — E872 Acidosis, unspecified: Secondary | ICD-10-CM | POA: Diagnosis not present

## 2021-11-30 DIAGNOSIS — I5022 Chronic systolic (congestive) heart failure: Secondary | ICD-10-CM | POA: Diagnosis not present

## 2021-11-30 DIAGNOSIS — E892 Postprocedural hypoparathyroidism: Secondary | ICD-10-CM | POA: Diagnosis present

## 2021-11-30 DIAGNOSIS — Z880 Allergy status to penicillin: Secondary | ICD-10-CM

## 2021-11-30 DIAGNOSIS — Z7984 Long term (current) use of oral hypoglycemic drugs: Secondary | ICD-10-CM

## 2021-11-30 DIAGNOSIS — H02849 Edema of unspecified eye, unspecified eyelid: Secondary | ICD-10-CM | POA: Diagnosis not present

## 2021-11-30 DIAGNOSIS — H02843 Edema of right eye, unspecified eyelid: Secondary | ICD-10-CM | POA: Diagnosis not present

## 2021-11-30 DIAGNOSIS — R609 Edema, unspecified: Secondary | ICD-10-CM | POA: Diagnosis not present

## 2021-11-30 DIAGNOSIS — E875 Hyperkalemia: Secondary | ICD-10-CM | POA: Diagnosis present

## 2021-11-30 DIAGNOSIS — D3132 Benign neoplasm of left choroid: Secondary | ICD-10-CM | POA: Diagnosis present

## 2021-11-30 DIAGNOSIS — Z905 Acquired absence of kidney: Secondary | ICD-10-CM

## 2021-11-30 DIAGNOSIS — H05019 Cellulitis of unspecified orbit: Secondary | ICD-10-CM | POA: Diagnosis not present

## 2021-11-30 DIAGNOSIS — I428 Other cardiomyopathies: Secondary | ICD-10-CM | POA: Diagnosis not present

## 2021-11-30 DIAGNOSIS — Z8249 Family history of ischemic heart disease and other diseases of the circulatory system: Secondary | ICD-10-CM

## 2021-11-30 LAB — BASIC METABOLIC PANEL
Anion gap: 9 (ref 5–15)
BUN: 48 mg/dL — ABNORMAL HIGH (ref 8–23)
CO2: 20 mmol/L — ABNORMAL LOW (ref 22–32)
Calcium: 9.3 mg/dL (ref 8.9–10.3)
Chloride: 110 mmol/L (ref 98–111)
Creatinine, Ser: 2.32 mg/dL — ABNORMAL HIGH (ref 0.61–1.24)
GFR, Estimated: 30 mL/min — ABNORMAL LOW (ref >60)
Glucose, Bld: 121 mg/dL — ABNORMAL HIGH (ref 70–99)
Potassium: 5.3 mmol/L — ABNORMAL HIGH (ref 3.5–5.1)
Sodium: 139 mmol/L (ref 135–145)

## 2021-11-30 LAB — CBC
HCT: 40.2 % (ref 39.0–52.0)
Hemoglobin: 12.6 g/dL — ABNORMAL LOW (ref 13.0–17.0)
MCH: 27 pg (ref 26.0–34.0)
MCHC: 31.3 g/dL (ref 30.0–36.0)
MCV: 86.1 fL (ref 80.0–100.0)
Platelets: 234 10*3/uL (ref 150–400)
RBC: 4.67 MIL/uL (ref 4.22–5.81)
RDW: 14.5 % (ref 11.5–15.5)
WBC: 9.9 10*3/uL (ref 4.0–10.5)
nRBC: 0 % (ref 0.0–0.2)

## 2021-11-30 LAB — POTASSIUM: Potassium: 4.9 mmol/L (ref 3.5–5.1)

## 2021-11-30 MED ORDER — FUROSEMIDE 40 MG PO TABS
40.0000 mg | ORAL_TABLET | ORAL | Status: DC
  Administered 2021-12-02 – 2021-12-04 (×2): 40 mg via ORAL
  Filled 2021-11-30 (×2): qty 1

## 2021-11-30 MED ORDER — VANCOMYCIN HCL 1750 MG/350ML IV SOLN
1750.0000 mg | INTRAVENOUS | Status: DC
  Administered 2021-11-30 – 2021-12-02 (×2): 1750 mg via INTRAVENOUS
  Filled 2021-11-30 (×2): qty 350

## 2021-11-30 MED ORDER — TRAZODONE HCL 50 MG PO TABS
25.0000 mg | ORAL_TABLET | Freq: Every evening | ORAL | Status: DC | PRN
  Administered 2021-12-01 – 2021-12-02 (×2): 25 mg via ORAL
  Filled 2021-11-30 (×2): qty 1

## 2021-11-30 MED ORDER — ROSUVASTATIN CALCIUM 10 MG PO TABS
10.0000 mg | ORAL_TABLET | Freq: Every day | ORAL | Status: DC
  Administered 2021-12-01 – 2021-12-03 (×4): 10 mg via ORAL
  Filled 2021-11-30 (×4): qty 1

## 2021-11-30 MED ORDER — GABAPENTIN 100 MG PO CAPS
100.0000 mg | ORAL_CAPSULE | Freq: Three times a day (TID) | ORAL | Status: DC
  Administered 2021-12-01 – 2021-12-04 (×11): 100 mg via ORAL
  Filled 2021-11-30 (×11): qty 1

## 2021-11-30 MED ORDER — HYDROCODONE-ACETAMINOPHEN 5-325 MG PO TABS
1.0000 | ORAL_TABLET | ORAL | Status: DC | PRN
  Administered 2021-12-01 – 2021-12-02 (×3): 1 via ORAL
  Administered 2021-12-03: 2 via ORAL
  Filled 2021-11-30: qty 2
  Filled 2021-11-30 (×3): qty 1

## 2021-11-30 MED ORDER — AMLODIPINE BESYLATE 10 MG PO TABS
10.0000 mg | ORAL_TABLET | Freq: Every day | ORAL | Status: DC
  Administered 2021-12-01 – 2021-12-04 (×4): 10 mg via ORAL
  Filled 2021-11-30 (×4): qty 1

## 2021-11-30 MED ORDER — IPRATROPIUM-ALBUTEROL 0.5-2.5 (3) MG/3ML IN SOLN
3.0000 mL | Freq: Four times a day (QID) | RESPIRATORY_TRACT | Status: DC | PRN
  Administered 2021-12-02 – 2021-12-03 (×2): 3 mL via RESPIRATORY_TRACT
  Filled 2021-11-30 (×2): qty 3

## 2021-11-30 MED ORDER — APIXABAN 2.5 MG PO TABS
2.5000 mg | ORAL_TABLET | Freq: Two times a day (BID) | ORAL | Status: DC
  Administered 2021-12-01 – 2021-12-04 (×8): 2.5 mg via ORAL
  Filled 2021-11-30 (×9): qty 1

## 2021-11-30 MED ORDER — HYDRALAZINE HCL 50 MG PO TABS
100.0000 mg | ORAL_TABLET | Freq: Three times a day (TID) | ORAL | Status: DC
  Administered 2021-12-01 – 2021-12-04 (×10): 100 mg via ORAL
  Filled 2021-11-30 (×12): qty 2

## 2021-11-30 MED ORDER — BISACODYL 5 MG PO TBEC: 5.0000 mg | DELAYED_RELEASE_TABLET | Freq: Every day | ORAL | Status: DC | PRN

## 2021-11-30 MED ORDER — ACETAMINOPHEN 650 MG RE SUPP: 650.0000 mg | Freq: Four times a day (QID) | RECTAL | Status: DC | PRN

## 2021-11-30 MED ORDER — VANCOMYCIN HCL 750 MG/150ML IV SOLN
750.0000 mg | Freq: Once | INTRAVENOUS | Status: AC
  Administered 2021-11-30: 750 mg via INTRAVENOUS
  Filled 2021-11-30: qty 150

## 2021-11-30 MED ORDER — PANTOPRAZOLE SODIUM 40 MG PO TBEC
40.0000 mg | DELAYED_RELEASE_TABLET | Freq: Every day | ORAL | Status: DC
  Administered 2021-12-01 – 2021-12-04 (×4): 40 mg via ORAL
  Filled 2021-11-30 (×4): qty 1

## 2021-11-30 MED ORDER — FUROSEMIDE 40 MG PO TABS
80.0000 mg | ORAL_TABLET | ORAL | Status: DC
  Administered 2021-12-01 – 2021-12-03 (×2): 80 mg via ORAL
  Filled 2021-11-30 (×2): qty 2

## 2021-11-30 MED ORDER — MORPHINE SULFATE (PF) 2 MG/ML IV SOLN: 1.0000 mg | Freq: Four times a day (QID) | INTRAVENOUS | Status: DC | PRN

## 2021-11-30 MED ORDER — ONDANSETRON HCL 4 MG/2ML IJ SOLN: 4.0000 mg | Freq: Four times a day (QID) | INTRAMUSCULAR | Status: DC | PRN

## 2021-11-30 MED ORDER — HYDRALAZINE HCL 50 MG PO TABS
50.0000 mg | ORAL_TABLET | Freq: Three times a day (TID) | ORAL | Status: DC
  Filled 2021-11-30: qty 1

## 2021-11-30 MED ORDER — LORATADINE 10 MG PO TABS
10.0000 mg | ORAL_TABLET | Freq: Every day | ORAL | Status: DC
  Administered 2021-12-01 – 2021-12-04 (×4): 10 mg via ORAL
  Filled 2021-11-30 (×4): qty 1

## 2021-11-30 MED ORDER — CARVEDILOL 25 MG PO TABS
12.5000 mg | ORAL_TABLET | Freq: Two times a day (BID) | ORAL | Status: DC
  Administered 2021-12-01 – 2021-12-04 (×8): 12.5 mg via ORAL
  Filled 2021-11-30 (×8): qty 1
  Filled 2021-11-30: qty 2

## 2021-11-30 MED ORDER — SACUBITRIL-VALSARTAN 24-26 MG PO TABS
1.0000 | ORAL_TABLET | Freq: Two times a day (BID) | ORAL | Status: DC
Start: 2021-12-01 — End: 2021-12-04
  Administered 2021-12-01 – 2021-12-04 (×8): 1 via ORAL
  Filled 2021-11-30 (×8): qty 1

## 2021-11-30 MED ORDER — ONDANSETRON HCL 4 MG PO TABS: 4.0000 mg | ORAL_TABLET | Freq: Four times a day (QID) | ORAL | Status: DC | PRN

## 2021-11-30 MED ORDER — SENNOSIDES-DOCUSATE SODIUM 8.6-50 MG PO TABS: 1.0000 | ORAL_TABLET | Freq: Every evening | ORAL | Status: DC | PRN

## 2021-11-30 MED ORDER — ACETAMINOPHEN 325 MG PO TABS: 650.0000 mg | ORAL_TABLET | Freq: Four times a day (QID) | ORAL | Status: DC | PRN

## 2021-11-30 MED ORDER — VITAMIN B-12 1000 MCG PO TABS
1000.0000 ug | ORAL_TABLET | Freq: Every day | ORAL | Status: DC
  Administered 2021-12-01 – 2021-12-04 (×4): 1000 ug via ORAL
  Filled 2021-11-30 (×4): qty 1

## 2021-11-30 NOTE — ED Triage Notes (Addendum)
Pt brought in via ems from home.  Pt has swelling and redness to right eyelid since yesterday.   No known injury.  Pt reports soreness and redness above right eyebrow.  No visual changes at this time.    Pt alert  speech clear.

## 2021-11-30 NOTE — ED Provider Notes (Signed)
[facility] Provider Note  Patient Contact: 7:12 PM (approximate)   History   Eye Pain   HPI  Azzam Mehra Willmon is a 66 y.o. male with a history of GERD, hypertension, diabetes and chronic kidney disease presents to the emergency department with right-sided periorbital swelling for the past 2 days.  Patient denies pain with extraocular eye muscle movement or blurry vision.  Denies a history of experiencing similar symptoms in the past.      Physical Exam   Triage Vital Signs: ED Triage Vitals  Enc Vitals Group     BP 11/30/21 1811 (!) 142/77     Pulse Rate 11/30/21 1811 (!) 56     Resp 11/30/21 1811 18     Temp 11/30/21 1811 98.6 F (37 C)     Temp Source 11/30/21 1811 Oral     SpO2 11/30/21 1811 98 %     Weight 11/30/21 1809 256 lb (116.1 kg)     Height 11/30/21 1809 '5\' 11"'$  (1.803 m)     Head Circumference --      Peak Flow --      Pain Score 11/30/21 1809 8     Pain Loc --      Pain Edu? --      Excl. in [place]? --     Most recent vital signs: Vitals:   11/30/21 1811  BP: (!) 142/77  Pulse: (!) 56  Resp: 18  Temp: 98.6 F (37 C)  SpO2: 98%     General: Alert and in no acute distress. Eyes:  PERRL. EOMI. patient has right-sided periorbital edema and erythema. Head: No acute traumatic findings ENT:      Nose: No congestion/rhinnorhea.      Mouth/Throat: Mucous membranes are moist. Neck: No stridor. No cervical spine tenderness to palpation. Cardiovascular:  Good peripheral perfusion Respiratory: Normal respiratory effort without tachypnea or retractions. Lungs CTAB. Good air entry to the bases with no decreased or absent breath sounds. Gastrointestinal: Bowel sounds 4 quadrants. Soft and nontender to palpation. No guarding or rigidity. No palpable masses. No distention. No CVA tenderness. Musculoskeletal: Full range of motion to all extremities.  Neurologic:  No gross focal neurologic deficits are appreciated.  Skin:   No rash  noted Other:   ED Results / Procedures / Treatments   Labs (all labs ordered are listed, but only abnormal results are displayed) Labs Reviewed  BASIC METABOLIC PANEL - Abnormal; Notable for the following components:      Result Value   Potassium 5.3 (*)    CO2 20 (*)    Glucose, Bld 121 (*)    BUN 48 (*)    Creatinine, Ser 2.32 (*)    GFR, Estimated 30 (*)    All other components within normal limits  CBC - Abnormal; Notable for the following components:   Hemoglobin 12.6 (*)    All other components within normal limits  POTASSIUM       RADIOLOGY  I personally viewed and evaluated these images as part of my medical decision making, as well as reviewing the written report by the radiologist.  ED Provider Interpretation: Patient has moderate to marked periorbital cellulitis on the right  PROCEDURES:  Critical Care performed: No  Procedures   MEDICATIONS ORDERED IN ED: Medications  vancomycin (VANCOREADY) IVPB 750 mg/150 mL (750 mg Intravenous New Bag/Given 11/30/21 2045)    Followed by  vancomycin (VANCOREADY) IVPB 1750 mg/350 mL (1,750 mg Intravenous New Bag/Given 11/30/21 2047)  IMPRESSION / MDM / ASSESSMENT AND PLAN / ED COURSE  I reviewed the triage vital signs and the nursing notes.                              Assessment and plan Periorbital cellulitis 66 year old male presents to the emergency department with moderate to marked periorbital cellulitis on the right.  Patient had mild bradycardia at triage but vital signs otherwise reassuring.   BMP is largely consistent with patient's baseline.  CT orbits concerning for moderate to marked periorbital cellulitis.  Patient was given IV vancomycin.  Patient does not have transportation and expresses concern for obtaining antibiotics.  Will admit to the hospitalist service for further care and management.  Patient was excepted for admission under the care of Dr. Ara Kussmaul  FINAL CLINICAL IMPRESSION(S) /  ED DIAGNOSES   Final diagnoses:  Periorbital cellulitis of right eye     Rx / DC Orders   ED Discharge Orders     None        Note:  This document was prepared using Dragon voice recognition software and may include unintentional dictation errors.   Vallarie Mare [place], Hershal Coria 11/30/21 2136    Harvest Dark, MD 11/30/21 2255

## 2021-11-30 NOTE — ED Triage Notes (Signed)
First Nurse Note:  Pt via EMS from home. Pt c/o R eye swelling. Denies pain. States it started today. Pt is A&Ox4 and NAD. VSS.

## 2021-11-30 NOTE — Consult Note (Signed)
Pharmacy Antibiotic Note  Larry Callahan is a 66 y.o. male admitted on 11/30/2021 with cellulitis.  Pharmacy has been consulted for vancomycin dosing.  Plan: Will give vancomycin 750 mg x 1 followed by 1750 mg x 1 for a total of 2500 mg loading dose. Scr seems to be at baseline will start vancomycin 1750 mg q48H maintenance dose. Predicted AUC of 481. Goal AUC 400-550. Scr 2.32, Vd 0.5, IBW. Plan to order levels after 3rd or 4th dose.   Height: '5\' 11"'$  (180.3 cm) Weight: 116.1 kg (256 lb) IBW/kg (Calculated) : 75.3  Temp (24hrs), Avg:98.6 F (37 C), Min:98.6 F (37 C), Max:98.6 F (37 C)  Recent Labs  Lab 11/30/21 1816  WBC 9.9  CREATININE 2.32*    Estimated Creatinine Clearance: 39.5 mL/min (A) (by C-G formula based on SCr of 2.32 mg/dL (H)).    Allergies  Allergen Reactions   Penicillins Rash    Antimicrobials this admission: 6/20 vancomycin >>   Dose adjustments this admission: None  Microbiology results: None  Thank you for allowing pharmacy to be a part of this patient's care.  Oswald Hillock, PharmD, BCPS 11/30/2021 8:20 PM

## 2021-11-30 NOTE — ED Provider Triage Note (Signed)
Emergency Medicine Provider Triage Evaluation Note  Larry Callahan , a 66 y.o. male  was evaluated in triage.  Pt complains of redness and swelling to the right eye and forehead.  Review of Systems  Positive: Redness and swelling Negative: Fever  Physical Exam  BP (!) 142/77 (BP Location: Left Arm)   Pulse (!) 56   Temp 98.6 F (37 C) (Oral)   Resp 18   Ht '5\' 11"'$  (1.803 m)   Wt 116.1 kg   SpO2 98%   BMI 35.70 kg/m  Gen:   Awake, no distress   Resp:  Normal effort  MSK:   Moves extremities without difficulty  Other:  Large amount of swelling noted at the right eyelid extending up above the right brow  Medical Decision Making  Medically screening exam initiated at 6:21 PM.  Appropriate orders placed.  Larry Callahan was informed that the remainder of the evaluation will be completed by another provider, this initial triage assessment does not replace that evaluation, and the importance of remaining in the ED until their evaluation is complete.  CT orbits with contrast ordered   Versie Starks, PA-C 11/30/21 Vernelle Emerald

## 2021-11-30 NOTE — H&P (Signed)
History and Physical   [facility] [place] [facility] Admission History and Physical [employer], D.O.    Patient Name: Larry Callahan MR#: [id number] Date of Birth: [birth year] Date of Admission: 11/30/2021  Referring MD/NP/PA: PA Sherral Hammers Primary Care Physician: [name], NP  Chief Complaint:  Chief Complaint  Patient presents with   Eye Pain    HPI: Larry Callahan is a 66 y.o. male with a known history of afib, CKD, CHF, CAD, DM, HTN, HLD, COPD, OSA presents to the emergency department for evaluation of right sided eye swelling for the past two days.  Patient denies pain, vision changes.   Patient denies fevers/chills, weakness, dizziness, chest pain, shortness of breath, N/V/C/D, abdominal pain, dysuria/frequency, changes in mental status.    Otherwise there has been no change in status. Patient has been taking medication as prescribed and there has been no recent change in medication or diet.  No recent antibiotics.  There has been no recent illness, hospitalizations, travel or sick contacts.    EMS/ED Course: Patient received Vancomycin. Medical admission has been requested for further management of Right eye periorbital cellulitis.  Review of Systems:  CONSTITUTIONAL: No fever/chills, fatigue, weakness, weight gain/loss, headache. EYES: Right eye swelling. No blurry or double vision. ENT: No tinnitus, postnasal drip, redness or soreness of the oropharynx. RESPIRATORY: No cough, dyspnea, wheeze.  No hemoptysis.  CARDIOVASCULAR: No chest pain, palpitations, syncope, orthopnea. No lower extremity edema.  GASTROINTESTINAL: No nausea, vomiting, abdominal pain, diarrhea, constipation.  No hematemesis, melena or hematochezia. GENITOURINARY: No dysuria, frequency, hematuria. ENDOCRINE: No polyuria or nocturia. No heat or cold intolerance. HEMATOLOGY: No anemia, bruising, bleeding. INTEGUMENTARY: No rashes, ulcers, lesions. MUSCULOSKELETAL: No arthritis, gout,  dyspnea. NEUROLOGIC: No numbness, tingling, ataxia, seizure-type activity, weakness. PSYCHIATRIC: No anxiety, depression, insomnia.   Past Medical History:  Diagnosis Date   Allergy    Atrial flutter ([place])    a. Dx 12/2020--CHA2DS2VASc = 5-->eliquis.   Chronic combined systolic (congestive) and diastolic (congestive) heart failure ([place])    a. 04/2019 Echo: EF 45-50%; b. 02/2019 Echo: EF 55-60%; c. 09/2020 Echo: EF 35-40%, glob HK. Nl RV size/fxn. Mild MR; d. 07/2021 Echo: EF 40-45%, mild LVH, low-nl RV fxn, no MR.   CKD (chronic kidney disease), stage III (HCC)    COPD (chronic obstructive pulmonary disease) ([place])    Coronary artery disease    a. 2011 Cath: nonobs dzs; b. 09/2020 MV: EF 38%. No signif ischemia. ? inf MI vs diaph attenuation. GI uptake artifact.   Diabetes mellitus without complication ([place])    GERD (gastroesophageal reflux disease)    Hyperlipidemia    Hypertension    Morbid obesity ([place])    NICM (nonischemic cardiomyopathy) ([place])    a. 04/2019 Echo: EF 45-50%; b. 02/2019 Echo: EF 55-60%; c. 09/2020 Echo: EF 35-40%, glob HK; d. 09/2020 MV: No ischemia. ? inf infarct vs attenuation; d. 07/2021 Echo: EF 40-45%.   OSA (obstructive sleep apnea)    Stage 3b chronic kidney disease ([place])     Past Surgical History:  Procedure Laterality Date   BACK SURGERY     CARDIAC CATHETERIZATION     CHOLECYSTECTOMY     COLONOSCOPY WITH PROPOFOL N/A 04/12/2019   Procedure: COLONOSCOPY WITH PROPOFOL;  Surgeon: [name], MD;  Location: [facility] ENDOSCOPY;  Service: Gastroenterology;  Laterality: N/A;   ESOPHAGOGASTRODUODENOSCOPY N/A 04/12/2019   Procedure: ESOPHAGOGASTRODUODENOSCOPY (EGD);  Surgeon: [name], MD;  Location: [facility] ENDOSCOPY;  Service: Gastroenterology;  Laterality: N/A;  ESOPHAGOGASTRODUODENOSCOPY (EGD) WITH PROPOFOL N/A 11/21/2019   Procedure: ESOPHAGOGASTRODUODENOSCOPY (EGD) WITH PROPOFOL;  Surgeon: [name], MD;  Location: [place];  Service:  Gastroenterology;  Laterality: N/A;   FLEXIBLE BRONCHOSCOPY Bilateral 05/17/2019   Procedure: FLEXIBLE BRONCHOSCOPY;  Surgeon: [name], MD;  Location: [name] ORS;  Service: Pulmonary;  Laterality: Bilateral;   left arm surgery     nephrectomy Left    PARATHYROIDECTOMY     RIGHT HEART CATH N/A 03/29/2019   Procedure: RIGHT HEART CATH;  Surgeon: [name], MD;  Location: [facility];  Service: Cardiovascular;  Laterality: N/A;     reports that he has quit smoking. His smoking use included cigarettes. He has quit using smokeless tobacco.  His smokeless tobacco use included chew. He reports that he does not drink alcohol and does not use drugs.  Allergies  Allergen Reactions   Penicillins Rash    Family History  Problem Relation Age of Onset   Diabetes Mother        2003   Lung cancer Father    Diabetes Sister    Hypertension Sister    Heart disease Sister    Cancer Sister    Bone cancer Brother     Prior to Admission medications   Medication Sig Start Date End Date Taking? Authorizing Provider  Accu-Chek Softclix Lancets lancets Use to check blood sugars 4 times a day   E11.65 08/20/21  Yes Abernathy, Alyssa, NP  amLODipine (NORVASC) 10 MG tablet Take 1 tablet (10 mg total) by mouth daily. 11/29/21  Yes Abernathy, Yetta Flock, NP  apixaban (ELIQUIS) 2.5 MG TABS tablet Take 1 tablet (2.5 mg total) by mouth 2 (two) times daily. 05/20/21  Yes Abernathy, Yetta Flock, NP  Blood Glucose Monitoring Suppl (TRUE METRIX AIR GLUCOSE METER) DEVI 1 Device by Does not apply route 2 (two) times daily. 04/13/18  Yes Boscia, Greer Ee, NP  Budeson-Glycopyrrol-Formoterol (BREZTRI AEROSPHERE) 160-9-4.8 MCG/ACT AERO Inhale into the lungs. Samples given.... 2 puffs in the morning and 2 puffs at bedtime   Yes [provider]  carvedilol (COREG) 12.5 MG tablet Take 1 tablet (12.5 mg total) by mouth 2 (two) times daily. 01/07/21 01/07/22 Yes Arta Silence, MD  cetirizine (ZYRTEC) 10 MG  tablet Take 10 mg by mouth daily as needed for allergies. Buys OTC   Yes [provider]  furosemide (LASIX) 40 MG tablet Take 2 tabs M/W/F AND one tab rest of the week 11/02/21  Yes Lavera Guise, MD  gabapentin (NEURONTIN) 100 MG capsule Take 1 capsule (100 mg total) by mouth in the morning, at noon, and at bedtime. 08/11/21  Yes Abernathy, Yetta Flock, NP  glimepiride (AMARYL) 2 MG tablet Take one tab po bid with meals 10/11/21  Yes Lavera Guise, MD  glucose blood (ACCU-CHEK GUIDE) test strip Use to check blood sugars 4 times a day   E11.65 08/20/21  Yes Abernathy, Alyssa, NP  hydrALAZINE (APRESOLINE) 100 MG tablet Take 100 mg by mouth 3 (three) times daily. 10/04/21  Yes [provider]  hydrALAZINE (APRESOLINE) 50 MG tablet Take 1 tablet (50 mg total) by mouth 3 (three) times daily. 10/11/21  Yes Lavera Guise, MD  insulin glargine, 2 Unit Dial, (TOUJEO MAX SOLOSTAR) 300 UNIT/ML Solostar Pen Inject 20 units in am 11/02/21  Yes Lavera Guise, MD  ipratropium-albuterol (DUONEB) 0.5-2.5 (3) MG/3ML SOLN Take 3 mLs by nebulization every 6 (six) hours as needed. 06/29/21  Yes Danford, Suann Larry, MD  Emmit [place]  PEN NEEDLE 32G X 6 MM MISC Use as directed 10/15/20  Yes Lavera Guise, MD  omeprazole (PRILOSEC) 40 MG capsule Take 1 capsule once in the morning and once in the evening 10/11/21  Yes Lavera Guise, MD  potassium chloride SA (KLOR-CON M) 20 MEQ tablet Take 1 tablet (20 mEq total) by mouth 2 (two) times daily. 09/10/21  Yes Darylene Price A, FNP  rosuvastatin (CRESTOR) 10 MG tablet Take 1 tablet (10 mg total) by mouth at bedtime. 08/11/21  Yes Abernathy, Alyssa, NP  sacubitril-valsartan (ENTRESTO) 24-26 MG Take 1 tablet by mouth 2 (two) times daily. 11/04/21  Yes Darylene Price A, FNP  vitamin B-12 1000 MCG tablet Take 1 tablet (1,000 mcg total) by mouth daily. 01/16/21  Yes Fritzi Mandes, MD  bacitracin 500 UNIT/GM ointment Apply 1 application. topically 2 (two) times daily. Patient not taking: Reported  on 11/04/2021 09/21/21   Sherrie George B, FNP  lidocaine (LIDODERM) 5 % Place 1 patch onto the skin daily. Remove & Discard patch within 12 hours or as directed by MD Patient not taking: Reported on 11/30/2021 09/19/21   Sidney Ace, MD    Physical Exam: Vitals:   11/30/21 1809 11/30/21 1811  BP:  (!) 142/77  Pulse:  (!) 56  Resp:  18  Temp:  98.6 F (37 C)  TempSrc:  Oral  SpO2:  98%  Weight: 116.1 kg   Height: _0  (1.803 m)     GENERAL: 66 y.o.-year-old white male patient, well-developed, well-nourished lying in the bed in no acute distress.  Pleasant and cooperative.   HEENT: Right eyelid swollen, periorbital erythema and edema. EOMI. Head atraumatic, normocephalic. Pupils equal. Mucus membranes moist. NECK: Supple. No JVD. CHEST: Normal breath sounds bilaterally. No wheezing, rales, rhonchi or crackles. No use of accessory muscles of respiration.  No reproducible chest wall tenderness.  CARDIOVASCULAR: S1, S2 normal. No murmurs, rubs, or gallops. Cap refill <2 seconds. Pulses intact distally.  ABDOMEN: Soft, nondistended, nontender. No rebound, guarding, rigidity. Normoactive bowel sounds present in all four quadrants.  EXTREMITIES: No pedal edema, cyanosis, or clubbing. No calf tenderness or Homan's sign.  NEUROLOGIC: The patient is alert and oriented x 3. Cranial nerves II through XII are grossly intact with no focal sensorimotor deficit. PSYCHIATRIC:  Normal affect, mood, thought content. SKIN: Warm, dry, and intact without obvious rash, lesion, or ulcer.    Labs on Admission:  CBC: Recent Labs  Lab 11/30/21 1816  WBC 9.9  HGB 12.6*  HCT 40.2  MCV 86.1  PLT 517   Basic Metabolic Panel: Recent Labs  Lab 11/30/21 1816 11/30/21 2034  NA 139  --   K 5.3* 4.9  CL 110  --   CO2 20*  --   GLUCOSE 121*  --   BUN 48*  --   CREATININE 2.32*  --   CALCIUM 9.3  --    GFR: Estimated Creatinine Clearance: 39.5 mL/min (A) (by C-G formula based on SCr of 2.32  mg/dL (H)). Liver Function Tests: No results for input(s): "AST", "ALT", "ALKPHOS", "BILITOT", "PROT", "ALBUMIN" in the last 168 hours. No results for input(s): "LIPASE", "AMYLASE" in the last 168 hours. No results for input(s): "AMMONIA" in the last 168 hours. Coagulation Profile: No results for input(s): "INR", "PROTIME" in the last 168 hours. Cardiac Enzymes: No results for input(s): "CKTOTAL", "CKMB", "CKMBINDEX", "TROPONINI" in the last 168 hours. BNP (last 3 results) No results for input(s): "PROBNP" in the last 8760 hours. HbA1C:  No results for input(s): "HGBA1C" in the last 72 hours. CBG: No results for input(s): "GLUCAP" in the last 168 hours. Lipid Profile: No results for input(s): "CHOL", "HDL", "LDLCALC", "TRIG", "CHOLHDL", "LDLDIRECT" in the last 72 hours. Thyroid Function Tests: No results for input(s): "TSH", "T4TOTAL", "FREET4", "T3FREE", "THYROIDAB" in the last 72 hours. Anemia Panel: No results for input(s): "VITAMINB12", "FOLATE", "FERRITIN", "TIBC", "IRON", "RETICCTPCT" in the last 72 hours. Urine analysis:    Component Value Date/Time   COLORURINE YELLOW 06/28/2021 1120   APPEARANCEUR CLEAR (A) 06/28/2021 1120   APPEARANCEUR Clear 03/23/2021 1448   LABSPEC 1.020 06/28/2021 1120   LABSPEC 1.005 03/06/2013 0929   PHURINE 5.0 06/28/2021 1120   GLUCOSEU 500 (A) 06/28/2021 1120   GLUCOSEU Negative 03/06/2013 0929   HGBUR NEGATIVE 06/28/2021 1120   BILIRUBINUR NEGATIVE 06/28/2021 1120   BILIRUBINUR Negative 03/23/2021 1448   BILIRUBINUR Negative 03/06/2013 0929   KETONESUR NEGATIVE 06/28/2021 1120   PROTEINUR >300 (A) 06/28/2021 1120   UROBILINOGEN 0.2 02/20/2019 1404   NITRITE NEGATIVE 06/28/2021 1120   LEUKOCYTESUR NEGATIVE 06/28/2021 1120   LEUKOCYTESUR Negative 03/06/2013 0929   Sepsis Labs: _0 (procalcitonin:4,lacticidven:4) )No results found for this or any previous visit (from the past 240 hour(s)).   Radiological Exams on Admission: CT  ORBITS WO CONTRAST  Result Date: 11/30/2021 CLINICAL DATA:  Asymptomatic right eye swelling. EXAM: CT ORBITS WITHOUT CONTRAST TECHNIQUE: Multidetector CT imaging of the orbits was performed using the standard protocol without intravenous contrast. Multiplanar CT image reconstructions were also generated. RADIATION DOSE REDUCTION: This exam was performed according to the departmental dose-optimization program which includes automated exposure control, adjustment of the mA and/or kV according to patient size and/or use of iterative reconstruction technique. COMPARISON:  None Available. FINDINGS: Orbits: No orbital mass is identified. Normal appearance of the globes, optic nerve-sheath complexes, extraocular muscles, orbital fat and lacrimal glands. Visible paranasal sinuses: Clear. Soft tissues: There is moderate to marked severity right-sided preseptal and right supra orbital soft tissue swelling. Osseous: No fracture or aggressive lesion. Limited intracranial: No acute or significant finding. IMPRESSION: Moderate to marked severity right-sided preseptal and right supra orbital soft tissue swelling which may represent sequelae associated with preorbital cellulitis. Ophthalmology consultation is recommended. Electronically Signed   By: Virgina Norfolk M.D.   On: 11/30/2021 19:56     Assessment/Plan  This is a 66 y.o. male with a history of afib, CKD, CHF, CAD, DM, HTN, HLD, COPD, OSA  now being admitted with:  #. Right eye periorbital cellulitis - Admit inpatient - Continue IV Vancomycin - Pain control PRN  #. History of afib - Continue Eliquis, Coreg  #. History of CHF - Continue Lasix, Entresto  #. History of DM - Accuchecks achs with RISS coverage - Heart healthy, carb controlled diet - Hold glimepiride, Toujeo  #. History of HTN - Continue Norvasc, Hydralazine  #. History of HLD - Continue Crestor  #. History of COPD - Continue Brestri, Zyrtec, Duoneb - O2 and nebs PRN  #.  History of GERD - Continue Prilosec  Admission status: Inpatient IV Fluids: HL Diet/Nutrition: Heart healthy, carb controlled Consults called: None  DVT Px: Eliquis, SCDs and early ambulation. Code Status: Full Code  Disposition Plan: To home in 1-2 days  All the records are reviewed and case discussed with ED provider. Management plans discussed with the patient and/or family who express understanding and agree with plan of care.  Vinita Prentiss D.O. on 11/30/2021 at 10:06 PM CC: Primary care physician; [name],  NP   11/30/2021, 10:06 PM

## 2021-11-30 NOTE — Telephone Encounter (Signed)
Pt called and c/o having pain on his forehead for several days and woke up this morning with a knot on forehead.  Pt advised it was painful and very red and also his vision was blurry and pain in his right eye.  Pt also informed me that last few days he has had his BS drop and  pt took his bs reading while on the phone and it was 87 @ 4:20 pm last meal was at lunch at 12 pm.  I advised pt that he needs to call 911 and go to the ER bc it could be the start of shingles or a skin infection.  Also with his vision being blurry there is a concern.  Pt advised he would go and he asked me if I would call his sister and let her know he is going to hospital.  I did call sister and informed her that pt was calling 911 to go to ER and I explained what pt told me his complaints were.

## 2021-11-30 NOTE — ED Notes (Signed)
Pt ambulatory to restroom

## 2021-11-30 NOTE — ED Notes (Signed)
See triage note   presents with some swelling to right eye  denies any injury  no fever

## 2021-12-01 ENCOUNTER — Other Ambulatory Visit: Payer: Self-pay

## 2021-12-01 ENCOUNTER — Encounter: Payer: Self-pay | Admitting: Family Medicine

## 2021-12-01 DIAGNOSIS — L03213 Periorbital cellulitis: Secondary | ICD-10-CM | POA: Diagnosis not present

## 2021-12-01 LAB — BASIC METABOLIC PANEL
Anion gap: 5 (ref 5–15)
BUN: 50 mg/dL — ABNORMAL HIGH (ref 8–23)
CO2: 20 mmol/L — ABNORMAL LOW (ref 22–32)
Calcium: 9.2 mg/dL (ref 8.9–10.3)
Chloride: 113 mmol/L — ABNORMAL HIGH (ref 98–111)
Creatinine, Ser: 2.23 mg/dL — ABNORMAL HIGH (ref 0.61–1.24)
GFR, Estimated: 31 mL/min — ABNORMAL LOW (ref >60)
Glucose, Bld: 107 mg/dL — ABNORMAL HIGH (ref 70–99)
Potassium: 4.7 mmol/L (ref 3.5–5.1)
Sodium: 138 mmol/L (ref 135–145)

## 2021-12-01 LAB — CBC
HCT: 37.3 % — ABNORMAL LOW (ref 39.0–52.0)
Hemoglobin: 11.9 g/dL — ABNORMAL LOW (ref 13.0–17.0)
MCH: 27.4 pg (ref 26.0–34.0)
MCHC: 31.9 g/dL (ref 30.0–36.0)
MCV: 85.7 fL (ref 80.0–100.0)
Platelets: 198 10*3/uL (ref 150–400)
RBC: 4.35 MIL/uL (ref 4.22–5.81)
RDW: 14.2 % (ref 11.5–15.5)
WBC: 6.9 10*3/uL (ref 4.0–10.5)
nRBC: 0 % (ref 0.0–0.2)

## 2021-12-01 LAB — HIV ANTIBODY (ROUTINE TESTING W REFLEX): HIV Screen 4th Generation wRfx: NONREACTIVE

## 2021-12-01 NOTE — Plan of Care (Signed)
  Problem: Education: Goal: Knowledge of General Education information will improve Description: Including pain rating scale, medication(s)/side effects and non-pharmacologic comfort measures Outcome: Progressing   Problem: Clinical Measurements: Goal: Diagnostic test results will improve Outcome: Progressing   Problem: Activity: Goal: Risk for activity intolerance will decrease Outcome: Progressing   Problem: Nutrition: Goal: Adequate nutrition will be maintained Outcome: Progressing   Problem: Coping: Goal: Level of anxiety will decrease Outcome: Progressing   Problem: Elimination: Goal: Will not experience complications related to bowel motility Outcome: Progressing Goal: Will not experience complications related to urinary retention Outcome: Progressing   Problem: Pain Managment: Goal: General experience of comfort will improve Outcome: Progressing   Problem: Safety: Goal: Ability to remain free from injury will improve Outcome: Progressing

## 2021-12-01 NOTE — Consult Note (Signed)
Reason for Consult:swollen above OD/ preseptal cellulitis Referring Physician: Jennye Boroughs, MD  Larry Callahan is an 66 y.o. male.  Chief complaint: swollen eye Periorbital cellulitis of right eye  HPI: 66 yo WM PMH DM, OSA, CHF, renal insuff presents w 2 day h/o tenderness and swelling of RUL and brow.  Denies Va changes.  No diplopia.  Despite years of DM, pt denies ever having eye exam. In ED, pt had CT that showed periorbital cellulitis OD without sinus disease or orbital involvement.   Pt denies any past ocular history or pertinent family ocular history.  Past Medical History:  Diagnosis Date   Allergy    Atrial flutter ([place])    a. Dx 12/2020--CHA2DS2VASc = 5-->eliquis.   Chronic combined systolic (congestive) and diastolic (congestive) heart failure ([place])    a. 04/2019 Echo: EF 45-50%; b. 02/2019 Echo: EF 55-60%; c. 09/2020 Echo: EF 35-40%, glob HK. Nl RV size/fxn. Mild MR; d. 07/2021 Echo: EF 40-45%, mild LVH, low-nl RV fxn, no MR.   CKD (chronic kidney disease), stage III (HCC)    COPD (chronic obstructive pulmonary disease) ([place])    Coronary artery disease    a. 2011 Cath: nonobs dzs; b. 09/2020 MV: EF 38%. No signif ischemia. ? inf MI vs diaph attenuation. GI uptake artifact.   Diabetes mellitus without complication ([place])    GERD (gastroesophageal reflux disease)    Hyperlipidemia    Hypertension    Morbid obesity ([place])    NICM (nonischemic cardiomyopathy) ([place])    a. 04/2019 Echo: EF 45-50%; b. 02/2019 Echo: EF 55-60%; c. 09/2020 Echo: EF 35-40%, glob HK; d. 09/2020 MV: No ischemia. ? inf infarct vs attenuation; d. 07/2021 Echo: EF 40-45%.   OSA (obstructive sleep apnea)    Stage 3b chronic kidney disease ([place])     ROS  Past Surgical History:  Procedure Laterality Date   BACK SURGERY     CARDIAC CATHETERIZATION     CHOLECYSTECTOMY     COLONOSCOPY WITH PROPOFOL N/A 04/12/2019   Procedure: COLONOSCOPY WITH PROPOFOL;  Surgeon: [name], MD;  Location:  [name] ENDOSCOPY;  Service: Gastroenterology;  Laterality: N/A;   ESOPHAGOGASTRODUODENOSCOPY N/A 04/12/2019   Procedure: ESOPHAGOGASTRODUODENOSCOPY (EGD);  Surgeon: [name], MD;  Location: [facility] ENDOSCOPY;  Service: Gastroenterology;  Laterality: N/A;   ESOPHAGOGASTRODUODENOSCOPY (EGD) WITH PROPOFOL N/A 11/21/2019   Procedure: ESOPHAGOGASTRODUODENOSCOPY (EGD) WITH PROPOFOL;  Surgeon: [name], MD;  Location: [facility] ENDOSCOPY;  Service: Gastroenterology;  Laterality: N/A;   FLEXIBLE BRONCHOSCOPY Bilateral 05/17/2019   Procedure: FLEXIBLE BRONCHOSCOPY;  Surgeon: [name], MD;  Location: [name] ORS;  Service: Pulmonary;  Laterality: Bilateral;   left arm surgery     nephrectomy Left    PARATHYROIDECTOMY     RIGHT HEART CATH N/A 03/29/2019   Procedure: RIGHT HEART CATH;  Surgeon: [name], MD;  Location: [facility];  Service: Cardiovascular;  Laterality: N/A;    Family History  Problem Relation Age of Onset   Diabetes Mother        2003   Lung cancer Father    Diabetes Sister    Hypertension Sister    Heart disease Sister    Cancer Sister    Bone cancer Brother     Social History:  reports that he has quit smoking. His smoking use included cigarettes. He has quit using smokeless tobacco.  His smokeless tobacco use included chew. He reports that he does not drink alcohol and does not use drugs.  Allergies:  Allergies  Allergen Reactions   Penicillins Rash    Medications: Prior to Admission:  Medications Prior to Admission  Medication Sig Dispense Refill Last Dose   Accu-Chek Softclix Lancets lancets Use to check blood sugars 4 times a day   E11.65 400 each 3 11/30/2021   amLODipine (NORVASC) 10 MG tablet Take 1 tablet (10 mg total) by mouth daily. 90 tablet 0 11/30/2021   apixaban (ELIQUIS) 2.5 MG TABS tablet Take 1 tablet (2.5 mg total) by mouth 2 (two) times daily. 60 tablet 5 11/30/2021   Blood Glucose Monitoring Suppl (TRUE METRIX AIR GLUCOSE  METER) DEVI 1 Device by Does not apply route 2 (two) times daily. 1 Device 0 11/30/2021   Budeson-Glycopyrrol-Formoterol (BREZTRI AEROSPHERE) 160-9-4.8 MCG/ACT AERO Inhale into the lungs. Samples given.... 2 puffs in the morning and 2 puffs at bedtime   11/30/2021   carvedilol (COREG) 12.5 MG tablet Take 1 tablet (12.5 mg total) by mouth 2 (two) times daily. 60 tablet 11 11/30/2021   cetirizine (ZYRTEC) 10 MG tablet Take 10 mg by mouth daily as needed for allergies. Buys OTC   prn at prn   furosemide (LASIX) 40 MG tablet Take 2 tabs M/W/F AND one tab rest of the week 30 tablet 3 11/29/2021   gabapentin (NEURONTIN) 100 MG capsule Take 1 capsule (100 mg total) by mouth in the morning, at noon, and at bedtime. 270 capsule 1 11/30/2021   glimepiride (AMARYL) 2 MG tablet Take one tab po bid with meals 180 tablet 3 11/30/2021   glucose blood (ACCU-CHEK GUIDE) test strip Use to check blood sugars 4 times a day   E11.65 400 each 3 11/30/2021   hydrALAZINE (APRESOLINE) 50 MG tablet Take 1 tablet (50 mg total) by mouth 3 (three) times daily. 270 tablet 1 11/30/2021   insulin glargine, 2 Unit Dial, (TOUJEO MAX SOLOSTAR) 300 UNIT/ML Solostar Pen Inject 20 units in am 9 mL 3 11/30/2021   ipratropium-albuterol (DUONEB) 0.5-2.5 (3) MG/3ML SOLN Take 3 mLs by nebulization every 6 (six) hours as needed. 270 mL 3 prn at prn   NOVOFINE PEN NEEDLE 32G X 6 MM MISC Use as directed 100 each 0 11/30/2021   omeprazole (PRILOSEC) 40 MG capsule Take 1 capsule once in the morning and once in the evening 180 capsule 1 11/30/2021   potassium chloride SA (KLOR-CON M) 20 MEQ tablet Take 1 tablet (20 mEq total) by mouth 2 (two) times daily. 180 tablet 3 11/30/2021   rosuvastatin (CRESTOR) 10 MG tablet Take 1 tablet (10 mg total) by mouth at bedtime. 90 tablet 1 11/29/2021   sacubitril-valsartan (ENTRESTO) 24-26 MG Take 1 tablet by mouth 2 (two) times daily. 60 tablet 3 11/30/2021   vitamin B-12 1000 MCG tablet Take 1 tablet (1,000 mcg total) by  mouth daily. 30 tablet 3 11/30/2021   bacitracin 500 UNIT/GM ointment Apply 1 application. topically 2 (two) times daily. (Patient not taking: Reported on 11/04/2021) 15 g 0 Not Taking   lidocaine (LIDODERM) 5 % Place 1 patch onto the skin daily. Remove & Discard patch within 12 hours or as directed by MD (Patient not taking: Reported on 11/30/2021) 30 patch 0 Not Taking   Scheduled:  amLODipine  10 mg Oral Daily   apixaban  2.5 mg Oral BID   carvedilol  12.5 mg Oral BID   [START ON 12/02/2021] furosemide  40 mg Oral Once per day on Sun Tue Thu Sat   furosemide  80 mg Oral Once per  day on Mon Wed Fri   gabapentin  100 mg Oral TID   hydrALAZINE  100 mg Oral TID   loratadine  10 mg Oral Daily   pantoprazole  40 mg Oral Daily   rosuvastatin  10 mg Oral QHS   sacubitril-valsartan  1 tablet Oral BID   cyanocobalamin  1,000 mcg Oral Daily   Continuous:  vancomycin Stopped (11/30/21 2303)    Results for orders placed or performed during the hospital encounter of 11/30/21 (from the past 48 hour(s))  Basic metabolic panel     Status: Abnormal   Collection Time: 11/30/21  6:16 PM  Result Value Ref Range   Sodium 139 135 - 145 mmol/L   Potassium 5.3 (H) 3.5 - 5.1 mmol/L    Comment: HEMOLYSIS AT THIS LEVEL MAY AFFECT RESULT   Chloride 110 98 - 111 mmol/L   CO2 20 (L) 22 - 32 mmol/L   Glucose, Bld 121 (H) 70 - 99 mg/dL    Comment: Glucose reference range applies only to samples taken after fasting for at least 8 hours.   BUN 48 (H) 8 - 23 mg/dL   Creatinine, Ser 2.32 (H) 0.61 - 1.24 mg/dL   Calcium 9.3 8.9 - 10.3 mg/dL   GFR, Estimated 30 (L) >60 mL/min    Comment: (NOTE) Calculated using the CKD-EPI Creatinine Equation (2021)    Anion gap 9 5 - 15    Comment: Performed at [facility], [address] [place], [place] [address]  CBC     Status: Abnormal   Collection Time: 11/30/21  6:16 PM  Result Value Ref Range   WBC 9.9 4.0 - 10.5 K/uL   RBC 4.67 4.22 - 5.81 MIL/uL    Hemoglobin 12.6 (L) 13.0 - 17.0 g/dL   HCT 40.2 39.0 - 52.0 %   MCV 86.1 80.0 - 100.0 fL   MCH 27.0 26.0 - 34.0 pg   MCHC 31.3 30.0 - 36.0 g/dL   RDW 14.5 11.5 - 15.5 %   Platelets 234 150 - 400 K/uL   nRBC 0.0 0.0 - 0.2 %    Comment: Performed at [facility], [address] [place], [place] [address]  Potassium     Status: None   Collection Time: 11/30/21  8:34 PM  Result Value Ref Range   Potassium 4.9 3.5 - 5.1 mmol/L    Comment: Performed at [facility], [address] [place], [place] [address]  Basic metabolic panel     Status: Abnormal   Collection Time: 12/01/21  5:43 AM  Result Value Ref Range   Sodium 138 135 - 145 mmol/L   Potassium 4.7 3.5 - 5.1 mmol/L   Chloride 113 (H) 98 - 111 mmol/L   CO2 20 (L) 22 - 32 mmol/L   Glucose, Bld 107 (H) 70 - 99 mg/dL    Comment: Glucose reference range applies only to samples taken after fasting for at least 8 hours.   BUN 50 (H) 8 - 23 mg/dL   Creatinine, Ser 2.23 (H) 0.61 - 1.24 mg/dL   Calcium 9.2 8.9 - 10.3 mg/dL   GFR, Estimated 31 (L) >60 mL/min    Comment: (NOTE) Calculated using the CKD-EPI Creatinine Equation (2021)    Anion gap 5 5 - 15    Comment: Performed at [facility], [address] [place], [place] [address]  CBC     Status: Abnormal   Collection Time: 12/01/21  5:43 AM  Result Value Ref  Range   WBC 6.9 4.0 - 10.5 K/uL   RBC 4.35 4.22 - 5.81 MIL/uL   Hemoglobin 11.9 (L) 13.0 - 17.0 g/dL   HCT 37.3 (L) 39.0 - 52.0 %   MCV 85.7 80.0 - 100.0 fL   MCH 27.4 26.0 - 34.0 pg   MCHC 31.9 30.0 - 36.0 g/dL   RDW 14.2 11.5 - 15.5 %   Platelets 198 150 - 400 K/uL   nRBC 0.0 0.0 - 0.2 %    Comment: Performed at [facility], [address] [place], [place] [address]  HIV Antibody (routine testing w rflx)     Status: None   Collection Time: 12/01/21  5:43 AM  Result Value Ref Range   HIV Screen 4th Generation wRfx Non Reactive Non Reactive    Comment: Performed at [facility], [place] [address], [place], [place] [address]    CT ORBITS WO CONTRAST  Result Date: 11/30/2021 CLINICAL DATA:  Asymptomatic right eye swelling. EXAM: CT ORBITS WITHOUT CONTRAST TECHNIQUE: Multidetector CT imaging of the orbits was performed using the standard protocol without intravenous contrast. Multiplanar CT image reconstructions were also generated. RADIATION DOSE REDUCTION: This exam was performed according to the departmental dose-optimization program which includes automated exposure control, adjustment of the mA and/or kV according to patient size and/or use of iterative reconstruction technique. COMPARISON:  None Available. FINDINGS: Orbits: No orbital mass is identified. Normal appearance of the globes, optic nerve-sheath complexes, extraocular muscles, orbital fat and lacrimal glands. Visible paranasal sinuses: Clear. Soft tissues: There is moderate to marked severity right-sided preseptal and right supra orbital soft tissue swelling. Osseous: No fracture or aggressive lesion. Limited intracranial: No acute or significant finding. IMPRESSION: Moderate to marked severity right-sided preseptal and right supra orbital soft tissue swelling which may represent sequelae associated with preorbital cellulitis. Ophthalmology consultation is recommended. Electronically Signed   By: Virgina Norfolk M.D.   On: 11/30/2021 19:56    Blood pressure (!) 149/74, pulse 65, temperature 97.8 F (36.6 C), temperature source Oral, resp. rate 16, height _0  (1.803 m), weight 116.1 kg, SpO2 95 %.  Mental status: Alert and Oriented x 4  Visual Acuity:  20/100 OD  20/100 near [place]  Pupils:  Equally round/ reactive to light.  No Afferent defect.  Motility:  Full/ orthophoric  Visual Fields:  Full to confrontation  IOP:  20 OD  22 OS tonopen  External/ Lids/ Lashes:  Tender R brow and RUL with 2-3+ erythema/ edema  Anterior Segment:  Conjunctiva:  1+ injection temporally OD, normal  OS  Cornea:  Normal  OU  Anterior Chamber: Normal  OU  Lens:   NS OU  Posterior Segment: Dilated OU with 1% Tropicamide and 2.5% Phenylephrine  Discs:   Normal c/d ratio, no pallor, no edema OU  Macula:  Normal  Vessels/ Periphery: Normal OD.  1-2 DD ch nevus temp OS    Assessment/Plan: 1) Periorbital cellulitis OD - no associated sinus disease.  No skin lesions.  Initial treatment with IV vanc.  May transition to Po abx as he responds.  2) DM- no visible retinopathy with 20D lens/ indirect  3) cataracts- observe. 4) Choroidal nevus OS - observe.  Follow up 1-2 weeks a [place] 12/01/2021, 2:49 PM

## 2021-12-01 NOTE — Progress Notes (Addendum)
Progress Note    Larry Callahan  OJJ:[id number] DOB: [birth year]  DOA: 11/30/2021 PCP: [name], NP      Brief Narrative:    Medical records reviewed and are as summarized below:  Larry Callahan is a 66 y.o. male with medical history significant for CKD stage IV, chronic systolic CHF, atrial fibrillation on Eliquis, CAD, type II DM, hypertension, hyperlipidemia, COPD, OSA, who presented to the hospital because of swelling and pain around the right eye.    He was admitted to the hospital for right periorbital cellulitis.  He was treated with analgesics and empiric IV antibiotics.      Assessment/Plan:   Principal Problem:   Periorbital cellulitis of right eye    Body mass index is 35.7 kg/m.  (Obesity)  Right periorbital cellulitis: Continue IV vancomycin and analgesics as needed for pain.  Consulted Dr. Wallace Going for further evaluation.  Chronic systolic CHF: Compensated.  Continue Lasix and Entresto.  2D echo in February 2023 showed EF estimated at 40 to 45%, indeterminate LV diastolic parameters.  CKD stage IV: Creatinine is stable  Hyperkalemia: Resolved  Other comorbidities include COPD, hypertension, type II DM, GERD, atrial fibrillation on Eliquis    Diet Order             Diet heart healthy/carb modified Room service appropriate? Yes; Fluid consistency: Thin  Diet effective now                          Consultants: Ophthalmologist  Procedures: None    Medications:    amLODipine  10 mg Oral Daily   apixaban  2.5 mg Oral BID   carvedilol  12.5 mg Oral BID   [START ON 12/02/2021] furosemide  40 mg Oral Once per day on Sun Tue Thu Sat   furosemide  80 mg Oral Once per day on Mon Wed Fri   gabapentin  100 mg Oral TID   hydrALAZINE  100 mg Oral TID   loratadine  10 mg Oral Daily   pantoprazole  40 mg Oral Daily   rosuvastatin  10 mg Oral QHS   sacubitril-valsartan  1 tablet Oral BID   cyanocobalamin   1,000 mcg Oral Daily   Continuous Infusions:  vancomycin Stopped (11/30/21 2303)     Anti-infectives (From admission, onward)    Start     Dose/Rate Route Frequency Ordered Stop   11/30/21 2200  vancomycin (VANCOREADY) IVPB 1750 mg/350 mL       See Hyperspace for full Linked Orders Report.   1,750 mg 175 mL/hr over 120 Minutes Intravenous Every 48 hours 11/30/21 2028     11/30/21 2030  vancomycin (VANCOREADY) IVPB 750 mg/150 mL       See Hyperspace for full Linked Orders Report.   750 mg 150 mL/hr over 60 Minutes Intravenous  Once 11/30/21 2028 11/30/21 2217              Family Communication/Anticipated D/C date and plan/Code Status   DVT prophylaxis: apixaban (ELIQUIS) tablet 2.5 mg Start: 11/30/21 2315 SCDs Start: 11/30/21 2306 apixaban (ELIQUIS) tablet 2.5 mg     Code Status: Full Code  Family Communication: None Disposition Plan: Plan to discharge home in 2 to 3 days   Status is: Inpatient Remains inpatient appropriate because: IV vancomycin for preseptal cellulitis       Subjective:   Interval events noted.  He complains of pain around the right eye.  No  visual impairment.  Objective:    Vitals:   12/01/21 0033 12/01/21 0133 12/01/21 0500 12/01/21 0747  BP: (!) 148/79 (!) 154/80 (!) 157/82 (!) 149/74  Pulse: 76 87 69 65  Resp:  18 16   Temp:  97.7 F (36.5 C) 97.7 F (36.5 C) 97.8 F (36.6 C)  TempSrc:    Oral  SpO2:  100% 98% 95%  Weight:      Height:       No data found.   Intake/Output Summary (Last 24 hours) at 12/01/2021 1321 Last data filed at 12/01/2021 0410 Gross per 24 hour  Intake 347.05 ml  Output --  Net 347.05 ml   Filed Weights   11/30/21 1809  Weight: 116.1 kg    Exam:  GEN: NAD SKIN: Right periorbital redness, swelling and tenderness EYES: EOMI, PERRLA, vision is okay ENT: MMM CV: RRR PULM: CTA B ABD: soft, obese, NT, +BS CNS: AAO x 3, non focal EXT: No edema or tenderness        Data Reviewed:    I have personally reviewed following labs and imaging studies:  Labs: Labs show the following:   Basic Metabolic Panel: Recent Labs  Lab 11/30/21 1816 11/30/21 2034 12/01/21 0543  NA 139  --  138  K 5.3* 4.9 4.7  CL 110  --  113*  CO2 20*  --  20*  GLUCOSE 121*  --  107*  BUN 48*  --  50*  CREATININE 2.32*  --  2.23*  CALCIUM 9.3  --  9.2   GFR Estimated Creatinine Clearance: 41.1 mL/min (A) (by C-G formula based on SCr of 2.23 mg/dL (H)). Liver Function Tests: No results for input(s): "AST", "ALT", "ALKPHOS", "BILITOT", "PROT", "ALBUMIN" in the last 168 hours. No results for input(s): "LIPASE", "AMYLASE" in the last 168 hours. No results for input(s): "AMMONIA" in the last 168 hours. Coagulation profile No results for input(s): "INR", "PROTIME" in the last 168 hours.  CBC: Recent Labs  Lab 11/30/21 1816 12/01/21 0543  WBC 9.9 6.9  HGB 12.6* 11.9*  HCT 40.2 37.3*  MCV 86.1 85.7  PLT 234 198   Cardiac Enzymes: No results for input(s): "CKTOTAL", "CKMB", "CKMBINDEX", "TROPONINI" in the last 168 hours. BNP (last 3 results) No results for input(s): "PROBNP" in the last 8760 hours. CBG: No results for input(s): "GLUCAP" in the last 168 hours. D-Dimer: No results for input(s): "DDIMER" in the last 72 hours. Hgb A1c: No results for input(s): "HGBA1C" in the last 72 hours. Lipid Profile: No results for input(s): "CHOL", "HDL", "LDLCALC", "TRIG", "CHOLHDL", "LDLDIRECT" in the last 72 hours. Thyroid function studies: No results for input(s): "TSH", "T4TOTAL", "T3FREE", "THYROIDAB" in the last 72 hours.  Invalid input(s): "FREET3" Anemia work up: No results for input(s): "VITAMINB12", "FOLATE", "FERRITIN", "TIBC", "IRON", "RETICCTPCT" in the last 72 hours. Sepsis Labs: Recent Labs  Lab 11/30/21 1816 12/01/21 0543  WBC 9.9 6.9    Microbiology No results found for this or any previous visit (from the past 240 hour(s)).  Procedures and diagnostic  studies:  CT ORBITS WO CONTRAST  Result Date: 11/30/2021 CLINICAL DATA:  Asymptomatic right eye swelling. EXAM: CT ORBITS WITHOUT CONTRAST TECHNIQUE: Multidetector CT imaging of the orbits was performed using the standard protocol without intravenous contrast. Multiplanar CT image reconstructions were also generated. RADIATION DOSE REDUCTION: This exam was performed according to the departmental dose-optimization program which includes automated exposure control, adjustment of the mA and/or kV according to patient size and/or use of  iterative reconstruction technique. COMPARISON:  None Available. FINDINGS: Orbits: No orbital mass is identified. Normal appearance of the globes, optic nerve-sheath complexes, extraocular muscles, orbital fat and lacrimal glands. Visible paranasal sinuses: Clear. Soft tissues: There is moderate to marked severity right-sided preseptal and right supra orbital soft tissue swelling. Osseous: No fracture or aggressive lesion. Limited intracranial: No acute or significant finding. IMPRESSION: Moderate to marked severity right-sided preseptal and right supra orbital soft tissue swelling which may represent sequelae associated with preorbital cellulitis. Ophthalmology consultation is recommended. Electronically Signed   By: Virgina Norfolk M.D.   On: 11/30/2021 19:56               LOS: 1 day   Nyhla Mountjoy  [facility]   Pager on [email or web address]. If 7PM-7AM, please contact night-coverage at [email or web address]     12/01/2021, 1:21 PM

## 2021-12-02 DIAGNOSIS — L03213 Periorbital cellulitis: Secondary | ICD-10-CM | POA: Diagnosis not present

## 2021-12-02 LAB — GLUCOSE, CAPILLARY: Glucose-Capillary: 164 mg/dL — ABNORMAL HIGH (ref 70–99)

## 2021-12-02 LAB — BASIC METABOLIC PANEL
Anion gap: 6 (ref 5–15)
BUN: 55 mg/dL — ABNORMAL HIGH (ref 8–23)
CO2: 18 mmol/L — ABNORMAL LOW (ref 22–32)
Calcium: 8.8 mg/dL — ABNORMAL LOW (ref 8.9–10.3)
Chloride: 109 mmol/L (ref 98–111)
Creatinine, Ser: 2.48 mg/dL — ABNORMAL HIGH (ref 0.61–1.24)
GFR, Estimated: 28 mL/min — ABNORMAL LOW (ref >60)
Glucose, Bld: 187 mg/dL — ABNORMAL HIGH (ref 70–99)
Potassium: 4.7 mmol/L (ref 3.5–5.1)
Sodium: 133 mmol/L — ABNORMAL LOW (ref 135–145)

## 2021-12-02 MED ORDER — INSULIN ASPART 100 UNIT/ML IJ SOLN
0.0000 [IU] | Freq: Three times a day (TID) | INTRAMUSCULAR | Status: DC
  Administered 2021-12-02 – 2021-12-03 (×2): 2 [IU] via SUBCUTANEOUS
  Administered 2021-12-03: 1 [IU] via SUBCUTANEOUS
  Administered 2021-12-03: 2 [IU] via SUBCUTANEOUS
  Administered 2021-12-04: 1 [IU] via SUBCUTANEOUS
  Administered 2021-12-04: 2 [IU] via SUBCUTANEOUS
  Filled 2021-12-02 (×6): qty 1

## 2021-12-02 MED ORDER — SODIUM BICARBONATE 650 MG PO TABS
650.0000 mg | ORAL_TABLET | Freq: Two times a day (BID) | ORAL | Status: DC
  Administered 2021-12-02 – 2021-12-04 (×4): 650 mg via ORAL
  Filled 2021-12-02 (×4): qty 1

## 2021-12-02 MED ORDER — INSULIN GLARGINE-YFGN 100 UNIT/ML ~~LOC~~ SOLN
15.0000 [IU] | Freq: Every day | SUBCUTANEOUS | Status: DC
Start: 2021-12-02 — End: 2021-12-04
  Administered 2021-12-02 – 2021-12-04 (×3): 15 [IU] via SUBCUTANEOUS
  Filled 2021-12-02 (×3): qty 0.15

## 2021-12-02 NOTE — Progress Notes (Signed)
Progress Note    Larry Callahan  OQH:[id number] DOB: Oct 21, 1955  DOA: 11/30/2021 PCP: [name], NP      Brief Narrative:    Medical records reviewed and are as summarized below:  Larry Callahan is a 66 y.o. male with medical history significant for CKD stage IV, chronic systolic CHF, atrial fibrillation on Eliquis, CAD, type II DM, hypertension, hyperlipidemia, COPD, OSA, who presented to the hospital because of swelling and pain around the right eye.    He was admitted to the hospital for right periorbital cellulitis.  He was treated with analgesics and empiric IV antibiotics.      Assessment/Plan:   Principal Problem:   Periorbital cellulitis of right eye    Body mass index is 35.7 kg/m.  (Obesity)  Right periorbital cellulitis: Continue IV vancomycin and analgesics.  Appreciate input from Dr. Wallace Going, ophthalmologist.    Chronic systolic CHF: Compensated.  Continue Lasix and Entresto.  2D echo in February 2023 showed EF estimated at 40 to 45%, indeterminate LV diastolic parameters.  CKD stage IV, metabolic acidosis: Creatinine is stable.  Bicarbonate is trending downward.  To start oral sodium bicarbonate.  Hyperkalemia: Resolved  Other comorbidities include COPD, OSA, chronic hypoxic respiratory failure on oxygen as needed, hypertension, type II DM, GERD, atrial fibrillation on Eliquis    Diet Order             Diet heart healthy/carb modified Room service appropriate? Yes; Fluid consistency: Thin  Diet effective now                          Consultants: Ophthalmologist  Procedures: None    Medications:    amLODipine  10 mg Oral Daily   apixaban  2.5 mg Oral BID   carvedilol  12.5 mg Oral BID   furosemide  40 mg Oral Once per day on Sun Tue Thu Sat   furosemide  80 mg Oral Once per day on Mon Wed Fri   gabapentin  100 mg Oral TID   hydrALAZINE  100 mg Oral TID   insulin aspart  0-9 Units Subcutaneous  TID WC   insulin glargine-yfgn  15 Units Subcutaneous Daily   loratadine  10 mg Oral Daily   pantoprazole  40 mg Oral Daily   rosuvastatin  10 mg Oral QHS   sacubitril-valsartan  1 tablet Oral BID   sodium bicarbonate  650 mg Oral BID   cyanocobalamin  1,000 mcg Oral Daily   Continuous Infusions:  vancomycin Stopped (11/30/21 2303)     Anti-infectives (From admission, onward)    Start     Dose/Rate Route Frequency Ordered Stop   11/30/21 2200  vancomycin (VANCOREADY) IVPB 1750 mg/350 mL       See Hyperspace for full Linked Orders Report.   1,750 mg 175 mL/hr over 120 Minutes Intravenous Every 48 hours 11/30/21 2028     11/30/21 2030  vancomycin (VANCOREADY) IVPB 750 mg/150 mL       See Hyperspace for full Linked Orders Report.   750 mg 150 mL/hr over 60 Minutes Intravenous  Once 11/30/21 2028 11/30/21 2217              Family Communication/Anticipated D/C date and plan/Code Status   DVT prophylaxis: apixaban (ELIQUIS) tablet 2.5 mg Start: 11/30/21 2315 SCDs Start: 11/30/21 2306 apixaban (ELIQUIS) tablet 2.5 mg     Code Status: Full Code  Family Communication: None Disposition Plan: Plan  to discharge home in 2 to 3 days   Status is: Inpatient Remains inpatient appropriate because: IV vancomycin for preseptal cellulitis       Subjective:   Interval events noted.  He complains of watery drainage from the right eye and pain around the right eye.  Requested breathing treatment.  He said he does not want to use the hospital CPAP because the mask is too small for him.  He said he is medically adherent with CPAP at home.  Objective:    Vitals:   12/02/21 0523 12/02/21 0725 12/02/21 0805 12/02/21 1619  BP: 126/73 132/82  (!) 156/85  Pulse: 65 67 63 67  Resp: '18 14 18 18  '$ Temp: 98.7 F (37.1 C) 97.7 F (36.5 C)  98.1 F (36.7 C)  TempSrc:  Oral    SpO2: 98% 99% 99% 97%  Weight:      Height:       No data found.   Intake/Output Summary (Last 24  hours) at 12/02/2021 1659 Last data filed at 12/02/2021 1411 Gross per 24 hour  Intake 480 ml  Output --  Net 480 ml   Filed Weights   11/30/21 1809  Weight: 116.1 kg    Exam:  GEN: NAD SKIN: Redness, swelling and tenderness around right eye is slowly improving EYES: EOMI ENT: MMM CV: RRR PULM: CTA B ABD: soft, obese, NT, +BS CNS: AAO x 3, non focal EXT: No edema or tenderness          Data Reviewed:   I have personally reviewed following labs and imaging studies:  Labs: Labs show the following:   Basic Metabolic Panel: Recent Labs  Lab 11/30/21 1816 11/30/21 2034 12/01/21 0543 12/02/21 0920  NA 139  --  138 133*  K 5.3*   < > 4.7 4.7  CL 110  --  113* 109  CO2 20*  --  20* 18*  GLUCOSE 121*  --  107* 187*  BUN 48*  --  50* 55*  CREATININE 2.32*  --  2.23* 2.48*  CALCIUM 9.3  --  9.2 8.8*   < > = values in this interval not displayed.   GFR Estimated Creatinine Clearance: 36.9 mL/min (A) (by C-G formula based on SCr of 2.48 mg/dL (H)). Liver Function Tests: No results for input(s): "AST", "ALT", "ALKPHOS", "BILITOT", "PROT", "ALBUMIN" in the last 168 hours. No results for input(s): "LIPASE", "AMYLASE" in the last 168 hours. No results for input(s): "AMMONIA" in the last 168 hours. Coagulation profile No results for input(s): "INR", "PROTIME" in the last 168 hours.  CBC: Recent Labs  Lab 11/30/21 1816 12/01/21 0543  WBC 9.9 6.9  HGB 12.6* 11.9*  HCT 40.2 37.3*  MCV 86.1 85.7  PLT 234 198   Cardiac Enzymes: No results for input(s): "CKTOTAL", "CKMB", "CKMBINDEX", "TROPONINI" in the last 168 hours. BNP (last 3 results) No results for input(s): "PROBNP" in the last 8760 hours. CBG: No results for input(s): "GLUCAP" in the last 168 hours. D-Dimer: No results for input(s): "DDIMER" in the last 72 hours. Hgb A1c: No results for input(s): "HGBA1C" in the last 72 hours. Lipid Profile: No results for input(s): "CHOL", "HDL", "LDLCALC", "TRIG",  "CHOLHDL", "LDLDIRECT" in the last 72 hours. Thyroid function studies: No results for input(s): "TSH", "T4TOTAL", "T3FREE", "THYROIDAB" in the last 72 hours.  Invalid input(s): "FREET3" Anemia work up: No results for input(s): "VITAMINB12", "FOLATE", "FERRITIN", "TIBC", "IRON", "RETICCTPCT" in the last 72 hours. Sepsis Labs: Recent Labs  Lab  11/30/21 1816 12/01/21 0543  WBC 9.9 6.9    Microbiology No results found for this or any previous visit (from the past 240 hour(s)).  Procedures and diagnostic studies:  CT ORBITS WO CONTRAST  Result Date: 11/30/2021 CLINICAL DATA:  Asymptomatic right eye swelling. EXAM: CT ORBITS WITHOUT CONTRAST TECHNIQUE: Multidetector CT imaging of the orbits was performed using the standard protocol without intravenous contrast. Multiplanar CT image reconstructions were also generated. RADIATION DOSE REDUCTION: This exam was performed according to the departmental dose-optimization program which includes automated exposure control, adjustment of the mA and/or kV according to patient size and/or use of iterative reconstruction technique. COMPARISON:  None Available. FINDINGS: Orbits: No orbital mass is identified. Normal appearance of the globes, optic nerve-sheath complexes, extraocular muscles, orbital fat and lacrimal glands. Visible paranasal sinuses: Clear. Soft tissues: There is moderate to marked severity right-sided preseptal and right supra orbital soft tissue swelling. Osseous: No fracture or aggressive lesion. Limited intracranial: No acute or significant finding. IMPRESSION: Moderate to marked severity right-sided preseptal and right supra orbital soft tissue swelling which may represent sequelae associated with preorbital cellulitis. Ophthalmology consultation is recommended. Electronically Signed   By: Virgina Norfolk M.D.   On: 11/30/2021 19:56               LOS: 2 days   Charlcie Prisco  [facility]   Pager on [email or web address]. If  7PM-7AM, please contact night-coverage at [email or web address]     12/02/2021, 4:59 PM

## 2021-12-03 DIAGNOSIS — I5022 Chronic systolic (congestive) heart failure: Secondary | ICD-10-CM | POA: Diagnosis not present

## 2021-12-03 DIAGNOSIS — N184 Chronic kidney disease, stage 4 (severe): Secondary | ICD-10-CM

## 2021-12-03 DIAGNOSIS — L03213 Periorbital cellulitis: Secondary | ICD-10-CM | POA: Diagnosis not present

## 2021-12-03 LAB — GLUCOSE, CAPILLARY
Glucose-Capillary: 136 mg/dL — ABNORMAL HIGH (ref 70–99)
Glucose-Capillary: 200 mg/dL — ABNORMAL HIGH (ref 70–99)
Glucose-Capillary: 172 mg/dL — ABNORMAL HIGH (ref 70–99)
Glucose-Capillary: 187 mg/dL — ABNORMAL HIGH (ref 70–99)

## 2021-12-03 LAB — BASIC METABOLIC PANEL
Anion gap: 4 — ABNORMAL LOW (ref 5–15)
BUN: 59 mg/dL — ABNORMAL HIGH (ref 8–23)
CO2: 20 mmol/L — ABNORMAL LOW (ref 22–32)
Calcium: 8.7 mg/dL — ABNORMAL LOW (ref 8.9–10.3)
Chloride: 113 mmol/L — ABNORMAL HIGH (ref 98–111)
Creatinine, Ser: 2.61 mg/dL — ABNORMAL HIGH (ref 0.61–1.24)
GFR, Estimated: 26 mL/min — ABNORMAL LOW (ref >60)
Glucose, Bld: 137 mg/dL — ABNORMAL HIGH (ref 70–99)
Potassium: 4.3 mmol/L (ref 3.5–5.1)
Sodium: 137 mmol/L (ref 135–145)

## 2021-12-03 MED ORDER — DOXYCYCLINE HYCLATE 100 MG PO TABS
100.0000 mg | ORAL_TABLET | Freq: Two times a day (BID) | ORAL | Status: DC
Start: 2021-12-03 — End: 2021-12-04
  Administered 2021-12-03 – 2021-12-04 (×3): 100 mg via ORAL
  Filled 2021-12-03 (×3): qty 1

## 2021-12-03 NOTE — Progress Notes (Addendum)
Progress Note    Larry Callahan  ZOX:[id number] DOB: 24-Dec-1955  DOA: 11/30/2021 PCP: Sallyanne Kuster, NP      Brief Narrative:    Medical records reviewed and are as summarized below:  Larry Callahan is a 66 y.o. male with medical history significant for CKD stage IV, chronic systolic CHF, atrial fibrillation on Eliquis, CAD, type II DM, hypertension, hyperlipidemia, COPD, OSA, who presented to the hospital because of swelling and pain around the right eye.    He was admitted to the hospital for right periorbital cellulitis.  He was treated with analgesics and empiric IV antibiotics.      Assessment/Plan:   Principal Problem:   Periorbital cellulitis of right eye Active Problems:   Chronic systolic CHF (congestive heart failure) (HCC)   COPD (chronic obstructive pulmonary disease) (HCC)   CKD (chronic kidney disease), stage IV (HCC)    Body mass index is 35.7 kg/m.  (Obesity)  Right periorbital cellulitis: Continue IV vancomycin (dosed every 48 hours) and analgesics.  Start doxycycline.  He has ready been evaluated by Dr. Lynnell Chad.  Chronic systolic CHF: Compensated.  Continue Lasix and Entresto 2D echo in February 2023 showed EF estimated at 40 to 45%, indeterminate LV diastolic parameters.  CKD stage IV, metabolic acidosis: Creatinine is stable.  Continue sodium bicarbonate  Hyperkalemia: Resolved  Other comorbidities include COPD, OSA, chronic hypoxic respiratory failure on oxygen as needed, hypertension, type II DM, GERD, atrial fibrillation on Eliquis    Diet Order             Diet heart healthy/carb modified Room service appropriate? Yes; Fluid consistency: Thin  Diet effective now                          Consultants: Ophthalmologist  Procedures: None    Medications:    amLODipine  10 mg Oral Daily   apixaban  2.5 mg Oral BID   carvedilol  12.5 mg Oral BID   doxycycline  100 mg Oral Q12H    furosemide  40 mg Oral Once per day on Sun Tue Thu Sat   furosemide  80 mg Oral Once per day on Mon Wed Fri   gabapentin  100 mg Oral TID   hydrALAZINE  100 mg Oral TID   insulin aspart  0-9 Units Subcutaneous TID WC   insulin glargine-yfgn  15 Units Subcutaneous Daily   loratadine  10 mg Oral Daily   pantoprazole  40 mg Oral Daily   rosuvastatin  10 mg Oral QHS   sacubitril-valsartan  1 tablet Oral BID   sodium bicarbonate  650 mg Oral BID   cyanocobalamin  1,000 mcg Oral Daily   Continuous Infusions:  vancomycin 1,750 mg (12/02/21 2139)     Anti-infectives (From admission, onward)    Start     Dose/Rate Route Frequency Ordered Stop   12/03/21 1330  doxycycline (VIBRA-TABS) tablet 100 mg        100 mg Oral Every 12 hours 12/03/21 1230     11/30/21 2200  vancomycin (VANCOREADY) IVPB 1750 mg/350 mL       See Hyperspace for full Linked Orders Report.   1,750 mg 175 mL/hr over 120 Minutes Intravenous Every 48 hours 11/30/21 2028     11/30/21 2030  vancomycin (VANCOREADY) IVPB 750 mg/150 mL       See Hyperspace for full Linked Orders Report.   750 mg 150 mL/hr over 60 Minutes  Intravenous  Once 11/30/21 2028 11/30/21 2217              Family Communication/Anticipated D/C date and plan/Code Status   DVT prophylaxis: apixaban (ELIQUIS) tablet 2.5 mg Start: 11/30/21 2315 SCDs Start: 11/30/21 2306 apixaban (ELIQUIS) tablet 2.5 mg     Code Status: Full Code  Family Communication: None Disposition Plan: Plan to discharge home tomorrow   Status is: Inpatient Remains inpatient appropriate because: IV vancomycin for preseptal cellulitis       Subjective:   Interval events noted.  He complains of pain around the right eye.  He does not feel comfortable going home today.  Objective:    Vitals:   12/02/21 2056 12/03/21 0533 12/03/21 0752 12/03/21 0917  BP: 138/65 113/77 129/78 134/74  Pulse: 66 82 70 67  Resp: 18 17 18    Temp: 98 F (36.7 C) 97.8 F (36.6  C) 98.5 F (36.9 C)   TempSrc:      SpO2: 98% 100% 97%   Weight:      Height:       No data found.   Intake/Output Summary (Last 24 hours) at 12/03/2021 1523 Last data filed at 12/03/2021 1300 Gross per 24 hour  Intake 480 ml  Output 1000 ml  Net -520 ml   Filed Weights   11/30/21 1809  Weight: 116.1 kg    Exam:  GEN: NAD SKIN: Warm and dry EYES: No discharge from the right eye.  Persistent redness, swelling and tenderness around the right eye ENT: MMM CV: RRR PULM: CTA B ABD: soft, ND, NT, +BS CNS: AAO x 3, non focal EXT: No edema or tenderness         Data Reviewed:   I have personally reviewed following labs and imaging studies:  Labs: Labs show the following:   Basic Metabolic Panel: Recent Labs  Lab 11/30/21 1816 11/30/21 2034 12/01/21 0543 12/02/21 0920 12/03/21 0559  NA 139  --  138 133* 137  K 5.3*   < > 4.7 4.7 4.3  CL 110  --  113* 109 113*  CO2 20*  --  20* 18* 20*  GLUCOSE 121*  --  107* 187* 137*  BUN 48*  --  50* 55* 59*  CREATININE 2.32*  --  2.23* 2.48* 2.61*  CALCIUM 9.3  --  9.2 8.8* 8.7*   < > = values in this interval not displayed.   GFR Estimated Creatinine Clearance: 35.1 mL/min (A) (by C-G formula based on SCr of 2.61 mg/dL (H)). Liver Function Tests: No results for input(s): "AST", "ALT", "ALKPHOS", "BILITOT", "PROT", "ALBUMIN" in the last 168 hours. No results for input(s): "LIPASE", "AMYLASE" in the last 168 hours. No results for input(s): "AMMONIA" in the last 168 hours. Coagulation profile No results for input(s): "INR", "PROTIME" in the last 168 hours.  CBC: Recent Labs  Lab 11/30/21 1816 12/01/21 0543  WBC 9.9 6.9  HGB 12.6* 11.9*  HCT 40.2 37.3*  MCV 86.1 85.7  PLT 234 198   Cardiac Enzymes: No results for input(s): "CKTOTAL", "CKMB", "CKMBINDEX", "TROPONINI" in the last 168 hours. BNP (last 3 results) No results for input(s): "PROBNP" in the last 8760 hours. CBG: Recent Labs  Lab 12/02/21 2100  12/03/21 0756 12/03/21 1157  GLUCAP 164* 136* 200*   D-Dimer: No results for input(s): "DDIMER" in the last 72 hours. Hgb A1c: No results for input(s): "HGBA1C" in the last 72 hours. Lipid Profile: No results for input(s): "CHOL", "HDL", "LDLCALC", "TRIG", "CHOLHDL", "  LDLDIRECT" in the last 72 hours. Thyroid function studies: No results for input(s): "TSH", "T4TOTAL", "T3FREE", "THYROIDAB" in the last 72 hours.  Invalid input(s): "FREET3" Anemia work up: No results for input(s): "VITAMINB12", "FOLATE", "FERRITIN", "TIBC", "IRON", "RETICCTPCT" in the last 72 hours. Sepsis Labs: Recent Labs  Lab 11/30/21 1816 12/01/21 0543  WBC 9.9 6.9    Microbiology No results found for this or any previous visit (from the past 240 hour(s)).  Procedures and diagnostic studies:  No results found.             LOS: 3 days   Larry Callahan  [facility]   Pager on [email or web address]. If 7PM-7AM, please contact night-coverage at [email or web address]     12/03/2021, 3:23 PM

## 2021-12-04 DIAGNOSIS — I5022 Chronic systolic (congestive) heart failure: Secondary | ICD-10-CM | POA: Diagnosis not present

## 2021-12-04 DIAGNOSIS — L03213 Periorbital cellulitis: Secondary | ICD-10-CM | POA: Diagnosis not present

## 2021-12-04 LAB — BASIC METABOLIC PANEL
Anion gap: 4 — ABNORMAL LOW (ref 5–15)
BUN: 53 mg/dL — ABNORMAL HIGH (ref 8–23)
CO2: 21 mmol/L — ABNORMAL LOW (ref 22–32)
Calcium: 8.7 mg/dL — ABNORMAL LOW (ref 8.9–10.3)
Chloride: 111 mmol/L (ref 98–111)
Creatinine, Ser: 2.37 mg/dL — ABNORMAL HIGH (ref 0.61–1.24)
GFR, Estimated: 29 mL/min — ABNORMAL LOW (ref >60)
Glucose, Bld: 135 mg/dL — ABNORMAL HIGH (ref 70–99)
Potassium: 4.2 mmol/L (ref 3.5–5.1)
Sodium: 136 mmol/L (ref 135–145)

## 2021-12-04 LAB — GLUCOSE, CAPILLARY
Glucose-Capillary: 141 mg/dL — ABNORMAL HIGH (ref 70–99)
Glucose-Capillary: 195 mg/dL — ABNORMAL HIGH (ref 70–99)
Glucose-Capillary: 138 mg/dL — ABNORMAL HIGH (ref 70–99)

## 2021-12-04 MED ORDER — DOXYCYCLINE HYCLATE 100 MG PO TABS: 100.0000 mg | ORAL_TABLET | Freq: Two times a day (BID) | ORAL | 0 refills | Status: AC

## 2021-12-06 ENCOUNTER — Emergency Department
Admission: EM | Admit: 2021-12-06 | Discharge: 2021-12-06 | Disposition: A | Discharge status: Home / Self Care | Payer: Medicare PPO | Attending: Emergency Medicine | Admitting: Emergency Medicine

## 2021-12-06 ENCOUNTER — Telehealth: Payer: Self-pay

## 2021-12-06 ENCOUNTER — Other Ambulatory Visit: Payer: Self-pay

## 2021-12-06 ENCOUNTER — Encounter: Payer: Self-pay | Admitting: Emergency Medicine

## 2021-12-06 DIAGNOSIS — Z79899 Other long term (current) drug therapy: Secondary | ICD-10-CM | POA: Insufficient documentation

## 2021-12-06 DIAGNOSIS — I10 Essential (primary) hypertension: Secondary | ICD-10-CM | POA: Diagnosis not present

## 2021-12-06 DIAGNOSIS — L03213 Periorbital cellulitis: Secondary | ICD-10-CM | POA: Insufficient documentation

## 2021-12-06 DIAGNOSIS — Z760 Encounter for issue of repeat prescription: Secondary | ICD-10-CM | POA: Diagnosis not present

## 2021-12-06 DIAGNOSIS — Z91148 Patient's other noncompliance with medication regimen for other reason: Secondary | ICD-10-CM | POA: Diagnosis not present

## 2021-12-06 DIAGNOSIS — E1142 Type 2 diabetes mellitus with diabetic polyneuropathy: Secondary | ICD-10-CM

## 2021-12-06 DIAGNOSIS — I5023 Acute on chronic systolic (congestive) heart failure: Secondary | ICD-10-CM

## 2021-12-06 MED ORDER — DOXYCYCLINE HYCLATE 100 MG PO TABS
100.0000 mg | ORAL_TABLET | Freq: Once | ORAL | Status: AC
  Administered 2021-12-06: 100 mg via ORAL
  Filled 2021-12-06: qty 1

## 2021-12-06 MED ORDER — DOXYCYCLINE HYCLATE 100 MG PO TABS: 100.0000 mg | ORAL_TABLET | Freq: Two times a day (BID) | ORAL | 0 refills | Status: AC

## 2021-12-06 NOTE — ED Provider Notes (Signed)
[facility] Provider Note    Event Date/Time   First MD Initiated Contact with Patient 12/06/21 1229     (approximate)   History   Medication Refill   HPI  Larry Callahan is a 66 y.o. male who presents to the ED for evaluation of Medication Refill   I reviewed DC summary from 2 days ago where patient was admitted for couple days for right-sided periorbital cellulitis.  Discharged with doxycycline.  Patient reports that he lost this prescription and has not taken it for the past 1 day and is requesting a refill.  Denies any fevers or worsening symptoms.  Reports having his other regular prescription medications.   Physical Exam   Triage Vital Signs: ED Triage Vitals  Enc Vitals Group     BP 12/06/21 1140 135/87     Pulse Rate 12/06/21 1138 92     Resp 12/06/21 1138 16     Temp 12/06/21 1138 97.9 F (36.6 C)     Temp Source 12/06/21 1138 Oral     SpO2 12/06/21 1138 97 %     Weight 12/06/21 1137 255 lb 11.7 oz (116 kg)     Height 12/06/21 1137 5\' 11"  (1.803 m)     Head Circumference --      Peak Flow --      Pain Score 12/06/21 1137 7     Pain Loc --      Pain Edu? --      Excl. in GC? --     Most recent vital signs: Vitals:   12/06/21 1138 12/06/21 1140  BP:  135/87  Pulse: 92   Resp: 16   Temp: 97.9 F (36.6 C)   SpO2: 97%     General: Awake, no distress.  Mild right-sided periorbital erythema.  Pupil is midrange, PERRL and symmetric to the left. CV:  Good peripheral perfusion.  Resp:  Normal effort.  Abd:  No distention.  MSK:  No deformity noted.  Neuro:  No focal deficits appreciated. Other:     ED Results / Procedures / Treatments   Labs (all labs ordered are listed, but only abnormal results are displayed) Labs Reviewed - No data to display  EKG   RADIOLOGY   Official radiology report(s): No results found.  PROCEDURES and INTERVENTIONS:  Procedures  Medications  doxycycline (VIBRA-TABS) tablet  100 mg (has no administration in time range)     IMPRESSION / MDM / ASSESSMENT AND PLAN / ED COURSE  I reviewed the triage vital signs and the nursing notes.  Differential diagnosis includes, but is not limited to, medication noncompliance, malingering, sepsis, abscess  66 year old male presents to the ED requesting a refill after losing his medication.  No sign of decompensated sepsis or cellulitis in the setting of this.  We will discharge with a refill and give him 1 dose while he is here.  Discussed return precautions.      FINAL CLINICAL IMPRESSION(S) / ED DIAGNOSES   Final diagnoses:  Medication management  Encounter for medication refill  Patient's other noncompliance with medication regimen for other reason     Rx / DC Orders   ED Discharge Orders          Ordered    doxycycline (VIBRA-TABS) 100 MG tablet  2 times daily        12/06/21 1232             Note:  This document was prepared using Dragon voice  recognition software and may include unintentional dictation errors.   Delton Prairie, MD 12/06/21 1236

## 2021-12-06 NOTE — Telephone Encounter (Signed)
Spoke to pt, he said his eye was swollen again, advised him to go back to the hospital, pt said he would call in a few minutes as he was talking care of things , I told him not to wait to call. Also informed him to call and make an appt with [name] before he leaves the hospital and to schedule transportation.

## 2021-12-06 NOTE — ED Triage Notes (Signed)
Pt to ED via POV, pt states that he was released from the hospital on Saturday for Periorbital cellulitis, pt was sent home with RX for antibiotics but he lost the RX. Pt is currently in NAD.

## 2021-12-07 ENCOUNTER — Other Ambulatory Visit: Payer: Self-pay

## 2021-12-07 NOTE — Patient Outreach (Signed)
[facility]) Care Management  12/07/2021  Larry Callahan August 12, 1955 [id number]     Transition of Care Referral  Referral Date: 12/06/2021 Referral Source:[facility] Hospital Liaison Date of Discharge: 12/04/2021 Facility: [facility]     Outreach attempt # 1 to patient. No answer. RN CM left HIPAA compliant voicemail message along with contact info.    Plan: RN CM will make outreach attempt to patient within 4 business days.   Antionette Fairy, RN,BSN,CCM [facility] Care Management Telephonic Care Management Coordinator Direct Phone: [phone] Toll Free: [phone] Fax: [phone]

## 2021-12-08 ENCOUNTER — Telehealth: Payer: Medicare PPO

## 2021-12-08 ENCOUNTER — Other Ambulatory Visit: Payer: Self-pay

## 2021-12-08 NOTE — Patient Outreach (Signed)
[facility]) Care Management  12/08/2021  Larry Callahan 1956/05/27 [id number]   Transition of Care Referral   Referral Date: 12/06/2021 Referral [facility] Liaison Date of Discharge: 12/04/2021 Facility: [facility]  Unsuccessful outreach attempt to patient. No answer after multiple rings.     Plan: RN CM will make outreach attempt to patient within 3-4 business days.  Enzo Montgomery, RN,BSN,CCM [place] Management Telephonic Care Management Coordinator Direct Phone: [phone] Toll Free: [phone] Fax: [phone]

## 2021-12-10 ENCOUNTER — Telehealth: Payer: Self-pay

## 2021-12-10 ENCOUNTER — Ambulatory Visit: Payer: Medicare PPO | Admitting: Family

## 2021-12-10 NOTE — Telephone Encounter (Signed)
Faxed a signed written order from [place] [facility] for [place] [facility] to [facility] at  fax #: [phone] 12/10/21 @ 11:39 am

## 2021-12-11 ENCOUNTER — Other Ambulatory Visit: Payer: Self-pay | Admitting: Internal Medicine

## 2021-12-11 ENCOUNTER — Other Ambulatory Visit: Payer: Self-pay | Admitting: Nurse Practitioner

## 2021-12-11 DIAGNOSIS — Z8673 Personal history of transient ischemic attack (TIA), and cerebral infarction without residual deficits: Secondary | ICD-10-CM

## 2021-12-13 ENCOUNTER — Other Ambulatory Visit: Payer: Self-pay

## 2021-12-13 ENCOUNTER — Telehealth: Payer: Self-pay

## 2021-12-13 DIAGNOSIS — K219 Gastro-esophageal reflux disease without esophagitis: Secondary | ICD-10-CM

## 2021-12-13 MED ORDER — ACCU-CHEK GUIDE VI STRP: ORAL_STRIP | 3 refills | Status: DC

## 2021-12-13 MED ORDER — OMEPRAZOLE 40 MG PO CPDR: DELAYED_RELEASE_CAPSULE | ORAL | 1 refills | Status: DC

## 2021-12-13 MED ORDER — CYANOCOBALAMIN 1000 MCG PO TABS: 1000.0000 ug | ORAL_TABLET | Freq: Every day | ORAL | 1 refills | Status: DC

## 2021-12-13 NOTE — Patient Outreach (Signed)
[facility]) Care Management  12/13/2021  Nav[place] Nine Penalver April 29, 1956 [id number]   Transition of Care Referral   Referral Date: 12/06/2021 Referral [place] [facility] Liaison Date of Discharge: 12/04/2021 Facility: [facility]   Unsuccessful outreach attempt #3 to patient.      Plan: RN CM will make outreach attempt to patient within 3-4 wks if no response from letter mailed to patient.  Enzo Montgomery, RN,BSN,CCM [place] Management Telephonic Care Management Coordinator Direct Phone: [phone] Toll Free: [phone] Fax: [phone]

## 2021-12-13 NOTE — Telephone Encounter (Signed)
12/10/21 Alden Benjamin [phone]) from [facility] requested a verbal order to move pt's visit to Monday due to pt not being at home. I approved the verbal order to move appt to Monday 12/13/21

## 2021-12-16 ENCOUNTER — Ambulatory Visit (HOSPITAL_BASED_OUTPATIENT_CLINIC_OR_DEPARTMENT_OTHER): Payer: Medicare PPO | Admitting: Family

## 2021-12-16 ENCOUNTER — Telehealth: Payer: Self-pay

## 2021-12-16 ENCOUNTER — Telehealth (HOSPITAL_COMMUNITY): Payer: Self-pay

## 2021-12-16 ENCOUNTER — Other Ambulatory Visit: Payer: Self-pay | Admitting: Family

## 2021-12-16 ENCOUNTER — Encounter: Payer: Self-pay | Admitting: Family

## 2021-12-16 ENCOUNTER — Other Ambulatory Visit
Admission: RE | Admit: 2021-12-16 | Discharge: 2021-12-16 | Disposition: A | Discharge status: Home / Self Care | Payer: Medicare PPO | Source: Ambulatory Visit | Attending: Family | Admitting: Family

## 2021-12-16 VITALS — BP 143/64 | HR 54 | Resp 18 | Ht 71.0 in | Wt 257.4 lb

## 2021-12-16 DIAGNOSIS — I5022 Chronic systolic (congestive) heart failure: Secondary | ICD-10-CM | POA: Diagnosis not present

## 2021-12-16 DIAGNOSIS — Z7901 Long term (current) use of anticoagulants: Secondary | ICD-10-CM | POA: Diagnosis not present

## 2021-12-16 DIAGNOSIS — I509 Heart failure, unspecified: Secondary | ICD-10-CM | POA: Insufficient documentation

## 2021-12-16 DIAGNOSIS — Z87891 Personal history of nicotine dependence: Secondary | ICD-10-CM | POA: Diagnosis not present

## 2021-12-16 DIAGNOSIS — I10 Essential (primary) hypertension: Secondary | ICD-10-CM

## 2021-12-16 DIAGNOSIS — Z79899 Other long term (current) drug therapy: Secondary | ICD-10-CM | POA: Diagnosis not present

## 2021-12-16 DIAGNOSIS — Z833 Family history of diabetes mellitus: Secondary | ICD-10-CM | POA: Diagnosis not present

## 2021-12-16 DIAGNOSIS — Z794 Long term (current) use of insulin: Secondary | ICD-10-CM

## 2021-12-16 DIAGNOSIS — Z9989 Dependence on other enabling machines and devices: Secondary | ICD-10-CM | POA: Insufficient documentation

## 2021-12-16 DIAGNOSIS — R609 Edema, unspecified: Secondary | ICD-10-CM | POA: Diagnosis not present

## 2021-12-16 DIAGNOSIS — I5042 Chronic combined systolic (congestive) and diastolic (congestive) heart failure: Secondary | ICD-10-CM | POA: Insufficient documentation

## 2021-12-16 DIAGNOSIS — I2510 Atherosclerotic heart disease of native coronary artery without angina pectoris: Secondary | ICD-10-CM | POA: Diagnosis present

## 2021-12-16 DIAGNOSIS — N184 Chronic kidney disease, stage 4 (severe): Secondary | ICD-10-CM | POA: Diagnosis present

## 2021-12-16 DIAGNOSIS — Z880 Allergy status to penicillin: Secondary | ICD-10-CM | POA: Diagnosis not present

## 2021-12-16 DIAGNOSIS — F419 Anxiety disorder, unspecified: Secondary | ICD-10-CM | POA: Insufficient documentation

## 2021-12-16 DIAGNOSIS — R5383 Other fatigue: Secondary | ICD-10-CM | POA: Insufficient documentation

## 2021-12-16 DIAGNOSIS — G4733 Obstructive sleep apnea (adult) (pediatric): Secondary | ICD-10-CM

## 2021-12-16 DIAGNOSIS — J449 Chronic obstructive pulmonary disease, unspecified: Secondary | ICD-10-CM | POA: Insufficient documentation

## 2021-12-16 DIAGNOSIS — R0602 Shortness of breath: Secondary | ICD-10-CM | POA: Diagnosis not present

## 2021-12-16 DIAGNOSIS — I5020 Unspecified systolic (congestive) heart failure: Secondary | ICD-10-CM | POA: Diagnosis not present

## 2021-12-16 DIAGNOSIS — Z905 Acquired absence of kidney: Secondary | ICD-10-CM | POA: Diagnosis not present

## 2021-12-16 DIAGNOSIS — E785 Hyperlipidemia, unspecified: Secondary | ICD-10-CM | POA: Diagnosis present

## 2021-12-16 DIAGNOSIS — I4892 Unspecified atrial flutter: Secondary | ICD-10-CM | POA: Diagnosis present

## 2021-12-16 DIAGNOSIS — R079 Chest pain, unspecified: Secondary | ICD-10-CM | POA: Diagnosis present

## 2021-12-16 DIAGNOSIS — I130 Hypertensive heart and chronic kidney disease with heart failure and stage 1 through stage 4 chronic kidney disease, or unspecified chronic kidney disease: Secondary | ICD-10-CM | POA: Insufficient documentation

## 2021-12-16 DIAGNOSIS — I428 Other cardiomyopathies: Secondary | ICD-10-CM | POA: Insufficient documentation

## 2021-12-16 DIAGNOSIS — I890 Lymphedema, not elsewhere classified: Secondary | ICD-10-CM | POA: Diagnosis present

## 2021-12-16 DIAGNOSIS — Z20822 Contact with and (suspected) exposure to covid-19: Secondary | ICD-10-CM | POA: Diagnosis present

## 2021-12-16 DIAGNOSIS — E890 Postprocedural hypothyroidism: Secondary | ICD-10-CM | POA: Diagnosis present

## 2021-12-16 DIAGNOSIS — I483 Typical atrial flutter: Secondary | ICD-10-CM | POA: Diagnosis not present

## 2021-12-16 DIAGNOSIS — Z9049 Acquired absence of other specified parts of digestive tract: Secondary | ICD-10-CM | POA: Diagnosis not present

## 2021-12-16 DIAGNOSIS — I5043 Acute on chronic combined systolic (congestive) and diastolic (congestive) heart failure: Secondary | ICD-10-CM | POA: Diagnosis present

## 2021-12-16 DIAGNOSIS — K219 Gastro-esophageal reflux disease without esophagitis: Secondary | ICD-10-CM | POA: Diagnosis present

## 2021-12-16 DIAGNOSIS — R072 Precordial pain: Secondary | ICD-10-CM | POA: Diagnosis not present

## 2021-12-16 DIAGNOSIS — I110 Hypertensive heart disease with heart failure: Secondary | ICD-10-CM | POA: Diagnosis not present

## 2021-12-16 DIAGNOSIS — E1122 Type 2 diabetes mellitus with diabetic chronic kidney disease: Secondary | ICD-10-CM

## 2021-12-16 DIAGNOSIS — R0789 Other chest pain: Secondary | ICD-10-CM | POA: Diagnosis not present

## 2021-12-16 DIAGNOSIS — Z7984 Long term (current) use of oral hypoglycemic drugs: Secondary | ICD-10-CM | POA: Insufficient documentation

## 2021-12-16 DIAGNOSIS — E669 Obesity, unspecified: Secondary | ICD-10-CM | POA: Diagnosis present

## 2021-12-16 DIAGNOSIS — J438 Other emphysema: Secondary | ICD-10-CM | POA: Diagnosis not present

## 2021-12-16 DIAGNOSIS — Z8249 Family history of ischemic heart disease and other diseases of the circulatory system: Secondary | ICD-10-CM | POA: Diagnosis not present

## 2021-12-16 DIAGNOSIS — I4891 Unspecified atrial fibrillation: Secondary | ICD-10-CM | POA: Diagnosis present

## 2021-12-16 LAB — BASIC METABOLIC PANEL
Anion gap: 7 (ref 5–15)
BUN: 63 mg/dL — ABNORMAL HIGH (ref 8–23)
CO2: 20 mmol/L — ABNORMAL LOW (ref 22–32)
Calcium: 9 mg/dL (ref 8.9–10.3)
Chloride: 111 mmol/L (ref 98–111)
Creatinine, Ser: 2.81 mg/dL — ABNORMAL HIGH (ref 0.61–1.24)
GFR, Estimated: 24 mL/min — ABNORMAL LOW (ref >60)
Glucose, Bld: 91 mg/dL (ref 70–99)
Potassium: 5.1 mmol/L (ref 3.5–5.1)
Sodium: 138 mmol/L (ref 135–145)

## 2021-12-16 MED ORDER — POTASSIUM CHLORIDE CRYS ER 20 MEQ PO TBCR: 20.0000 meq | EXTENDED_RELEASE_TABLET | Freq: Every day | ORAL | 3 refills | Status: DC

## 2021-12-16 NOTE — Telephone Encounter (Signed)
Attempted to call to set up home visit. No answer, left message to call me.   [place] [phone]

## 2021-12-16 NOTE — Telephone Encounter (Addendum)
Left detailed message below per patient, instructing patient to decrease potassium to one tablet per day, and asked patient to call our office and verify that he received this message.    ----- Message from Alisa Graff, FNP sent at 12/16/2021  1:09 PM EDT ----- Potassium is a little on the high side so decrease your potassium to just 1 tablet every day (instead of 2). Will recheck lab work at next visit.

## 2021-12-16 NOTE — Progress Notes (Signed)
Patient ID: Larry Callahan, male    DOB: [birth year], 66 y.o.   MRN: [id number]  HPI  Larry Callahan is a 66 yo male with a PMHx of nonischemic cardiomyopathy last EF measured in April 2022 was 60 to 40%, COPD, sleep apnea, chronic kidney disease stage IV, chronic anemia, hypertension, and diabetes mellitus.  Echo from 08/08/21 reviewed and showed and EF of 40-45%. There is mild to moderately decreased function of the left ventricle. The diastolic parameters are indeterminate. The RV systolic function is low normal.   RHC done 03/29/2019 and showed: Pulmonary capillary wedge pressure mean of 8 PA: 32/16 mean 23 RA mean of 10 RV 26/12/13  Cardiac output 8.17 Cardiac index 3.42 Final Conclusions:   Normal right heart pressures  Was in the ED 12/06/21 due to needing antibiotic refill as he lost previous prescription. Admitted 11/30/21 due to swelling and pain around the right eye. Ophthamology consult obtained. Started on IV antibiotics due to periorbital cellulitis and then transitioned to oral antibiotics. Discharged after 4 days. Admitted 09/23/21 due to weakness and left-sided chest pain. PT/OT evaluations done. Cardiology consult obtained. Troponins unremarkable. Discharged after 2 days. Was in the ED 09/21/21 due to hand laceration. Sutures placed and he was released. Admitted 09/16/21 due to sudden chest pain. Cardiology consult obtained. NSTEMI ruled out. Furosemide decreased to daily with stoppage of chlorthalidone. Discharged in 2 days. Admitted 2/23-2/26/23 with worsening SOB, and orthopnea, diuresed 4.9 liters and ultimately d/c'd home with [facility] RN services.   He presents for a follow-up visit with a chief complaint of minimal fatigue upon moderate exertion. Describes this as chronic in nature. He has associated cough, shortness of breath, pedal edema, anxiety and slight weight gain. He denies any dizziness, abdominal distention, palpitations or chest pain.   Continues to ask a question  and then before I can answer, he starts talking about something unrelated to his question. Difficult to get a clear history or understanding of his biggest concern. He is able to tell me that he's concerned about the swelling in his legs.   Denies adding salt to his food but mentions eating out at [employer] but then says he only got tea from there.   Past Medical History:  Diagnosis Date   Allergy    Atrial flutter ([place])    a. Dx 12/2020--CHA2DS2VASc = 5-->eliquis.   Chronic combined systolic (congestive) and diastolic (congestive) heart failure ([place])    a. 04/2019 Echo: EF 45-50%; b. 02/2019 Echo: EF 55-60%; c. 09/2020 Echo: EF 35-40%, glob HK. Nl RV size/fxn. Mild MR; d. 07/2021 Echo: EF 40-45%, mild LVH, low-nl RV fxn, no MR.   CKD (chronic kidney disease), stage III (HCC)    COPD (chronic obstructive pulmonary disease) ([place])    Coronary artery disease    a. 2011 Cath: nonobs dzs; b. 09/2020 MV: EF 38%. No signif ischemia. ? inf MI vs diaph attenuation. GI uptake artifact.   Diabetes mellitus without complication ([place])    GERD (gastroesophageal reflux disease)    Hyperlipidemia    Hypertension    Morbid obesity ([place])    NICM (nonischemic cardiomyopathy) ([place])    a. 04/2019 Echo: EF 45-50%; b. 02/2019 Echo: EF 55-60%; c. 09/2020 Echo: EF 35-40%, glob HK; d. 09/2020 MV: No ischemia. ? inf infarct vs attenuation; d. 07/2021 Echo: EF 40-45%.   OSA (obstructive sleep apnea)    Stage 3b chronic kidney disease [facility])    Past Surgical History:  Procedure Laterality Date   BACK  SURGERY     CARDIAC CATHETERIZATION     CHOLECYSTECTOMY     COLONOSCOPY WITH PROPOFOL N/A 04/12/2019   Procedure: COLONOSCOPY WITH PROPOFOL;  Surgeon: [name], MD;  Location: [facility] ENDOSCOPY;  Service: Gastroenterology;  Laterality: N/A;   ESOPHAGOGASTRODUODENOSCOPY N/A 04/12/2019   Procedure: ESOPHAGOGASTRODUODENOSCOPY (EGD);  Surgeon: [name], MD;  Location: [facility] ENDOSCOPY;  Service: Gastroenterology;   Laterality: N/A;   ESOPHAGOGASTRODUODENOSCOPY (EGD) WITH PROPOFOL N/A 11/21/2019   Procedure: ESOPHAGOGASTRODUODENOSCOPY (EGD) WITH PROPOFOL;  Surgeon: [name], MD;  Location: [facility] ENDOSCOPY;  Service: Gastroenterology;  Laterality: N/A;   FLEXIBLE BRONCHOSCOPY Bilateral 05/17/2019   Procedure: FLEXIBLE BRONCHOSCOPY;  Surgeon: [name], MD;  Location: [name] ORS;  Service: Pulmonary;  Laterality: Bilateral;   left arm surgery     nephrectomy Left    PARATHYROIDECTOMY     RIGHT HEART CATH N/A 03/29/2019   Procedure: RIGHT HEART CATH;  Surgeon: [name], MD;  Location: [place] CV LAB;  Service: Cardiovascular;  Laterality: N/A;   Family History  Problem Relation Age of Onset   Diabetes Mother        2003   Lung cancer Father    Diabetes Sister    Hypertension Sister    Heart disease Sister    Cancer Sister    Bone cancer Brother    Social History   Tobacco Use   Smoking status: Former    Types: Cigarettes   Smokeless tobacco: Former    Types: Chew  Substance Use Topics   Alcohol use: No   Allergies  Allergen Reactions   Penicillins Rash   Prior to Admission medications   Medication Sig Start Date End Date Taking? Authorizing Provider  Accu-Chek Softclix Lancets lancets Use to check blood sugars 4 times a day   E11.65 08/20/21  Yes Abernathy, Alyssa, NP  amLODipine (NORVASC) 10 MG tablet Take 1 tablet (10 mg total) by mouth daily. 11/29/21  Yes Abernathy, Yetta Flock, NP  Blood Glucose Monitoring Suppl (TRUE METRIX AIR GLUCOSE METER) DEVI 1 Device by Does not apply route 2 (two) times daily. 04/13/18  Yes Boscia, Greer Ee, NP  Budeson-Glycopyrrol-Formoterol (BREZTRI AEROSPHERE) 160-9-4.8 MCG/ACT AERO Inhale into the lungs. Samples given.... 2 puffs in the morning and 2 puffs at bedtime   Yes [provider]  carvedilol (COREG) 12.5 MG tablet Take 1 tablet (12.5 mg total) by mouth 2 (two) times daily. 01/07/21 01/07/22 Yes Arta Silence, MD   cetirizine (ZYRTEC) 10 MG tablet Take 10 mg by mouth daily as needed for allergies. Buys OTC   Yes [provider]  cyanocobalamin 1000 MCG tablet Take 1 tablet (1,000 mcg total) by mouth daily. 12/13/21  Yes Lavera Guise, MD  ELIQUIS 2.5 MG TABS tablet Take 1 tablet (2.5 mg total) by mouth 2 (two) times daily. 12/12/21  Yes Lavera Guise, MD  furosemide (LASIX) 40 MG tablet Take 2 tabs M/W/F AND one tab rest of the week 11/02/21  Yes Lavera Guise, MD  gabapentin (NEURONTIN) 100 MG capsule Take 1 capsule (100 mg total) by mouth in the morning, at noon, and at bedtime. 08/11/21  Yes Abernathy, Yetta Flock, NP  glimepiride (AMARYL) 2 MG tablet Take one tab po bid with meals 10/11/21  Yes Lavera Guise, MD  glucose blood (ACCU-CHEK GUIDE) test strip Use to check blood sugars 4 times a day   E11.65 12/13/21  Yes Lavera Guise, MD  hydrALAZINE (APRESOLINE) 50 MG tablet Take 1 tablet (50  mg total) by mouth 3 (three) times daily. 10/11/21  Yes Lavera Guise, MD  insulin glargine, 2 Unit Dial, (TOUJEO MAX SOLOSTAR) 300 UNIT/ML Solostar Pen Inject 20 units in am 11/02/21  Yes Lavera Guise, MD  ipratropium-albuterol (DUONEB) 0.5-2.5 (3) MG/3ML SOLN Take 3 mLs by nebulization every 6 (six) hours as needed. 06/29/21  Yes Danford, Suann Larry, MD  NOVOFINE PEN NEEDLE 32G X 6 MM MISC Use as directed 12/12/21  Yes Lavera Guise, MD  omeprazole (PRILOSEC) 40 MG capsule Take 1 capsule once in the morning and once in the evening 12/13/21  Yes Lavera Guise, MD  potassium chloride SA (KLOR-CON M) 20 MEQ tablet Take 1 tablet (20 mEq total) by mouth 2 (two) times daily. 09/10/21  Yes Darylene Price A, FNP  rosuvastatin (CRESTOR) 10 MG tablet Take 1 tablet (10 mg total) by mouth at bedtime. 08/11/21  Yes Abernathy, Alyssa, NP  sacubitril-valsartan (ENTRESTO) 24-26 MG Take 1 tablet by mouth 2 (two) times daily. 11/04/21  Yes Alisa Graff, FNP    Review of Systems  Constitutional:  Positive for fatigue. Negative for appetite  change.  HENT:  Positive for hearing loss. Negative for congestion and sore throat.   Eyes: Negative.   Respiratory:  Positive for cough and shortness of breath.   Cardiovascular:  Positive for leg swelling. Negative for chest pain and palpitations.  Gastrointestinal:  Negative for abdominal distention and abdominal pain.  Endocrine: Negative.   Genitourinary: Negative.   Musculoskeletal:  Negative for back pain and neck pain.  Skin: Negative.   Allergic/Immunologic: Negative.   Neurological:  Negative for dizziness and light-headedness.  Hematological:  Negative for adenopathy. Does not bruise/bleed easily.  Psychiatric/Behavioral:  Negative for dysphoric mood and sleep disturbance (sleeping on 1 pillow with CPAP). The patient is nervous/anxious.    Vitals:   12/16/21 1000  BP: (!) 143/64  Pulse: (!) 54  Resp: 18  SpO2: 99%  Weight: 257 lb 6 oz (116.7 kg)  Height: _0  (1.803 m)   Wt Readings from Last 3 Encounters:  12/16/21 257 lb 6 oz (116.7 kg)  12/06/21 255 lb 11.7 oz (116 kg)  11/30/21 256 lb (116.1 kg)   Lab Results  Component Value Date   CREATININE 2.37 (H) 12/04/2021   CREATININE 2.61 (H) 12/03/2021   CREATININE 2.48 (H) 12/02/2021   Physical Exam Vitals and nursing note reviewed.  Constitutional:      Appearance: Normal appearance.  HENT:     Head: Normocephalic and atraumatic.  Cardiovascular:     Rate and Rhythm: Regular rhythm. Bradycardia present.  Pulmonary:     Effort: Pulmonary effort is normal. No respiratory distress.     Breath sounds: No wheezing or rales.  Abdominal:     General: There is no distension.     Palpations: Abdomen is soft.  Musculoskeletal:        General: No tenderness.     Cervical back: Normal range of motion and neck supple.     Right lower leg: Edema (1+ pitting) present.     Left lower leg: Edema (1+ pitting) present.  Skin:    General: Skin is warm and dry.  Neurological:     General: No focal deficit present.      Mental Status: He is alert and oriented to person, place, and time.  Psychiatric:        Mood and Affect: Mood is anxious.        Behavior: Behavior  normal.     Assessment and Plan:  Chronic Heart failure with reduced ejection fraction- - NYHA II - euvolemic today - weighing daily; reminded to call for an overnight weight gain of > 2 pounds or a weekly weight gain of > 5 pounds - weight up 2 pounds from last visit here 6 weeks ago - not adding salt to his food - participating in paramedicine program  - saw cardiology Larry Callahan) 11/11/20; needs f/u scheduled - on GDMT coreg & entresto - hesitant to add MRA as he won't be able to do frequent lab draws  - BNP 10/05/21 was 221.4  2. HTN with CKD- - BP mildly elevated (143/64) but he is talking almost nonstop - had video visit with PCP Larry Callahan) 11/02/21 - BMP 12/04/21 reviewed and showed Na 136, K 4.2, Cr 2.37, gfr 29 - check labs today  3. DM-  - A1C 7.0% 08/06/21 - glucose at home today was 131  4: Lymphedema- - reports elevating his legs but edema persists - wearing compression socks daily although doesn't have them on today - will decrease amlodipine to 12m daily to see if pedal edema improves - is quite active at home - edema has improved from last visit here  5. OSA- - wears CPAP with O2 HS, for "all hours I sleep, including naps"   Medication bottles reviewed.   Return in 1 month, sooner if needed.

## 2021-12-16 NOTE — Patient Instructions (Addendum)
Kristi(Paramedicine)- [phone]   Continue weighing daily and call for an overnight weight gain of 3 pounds or more or a weekly weight gain of more than 5 pounds.   If you have voicemail, please make sure your mailbox is cleaned out so that we may leave a message and please make sure to listen to any voicemails.    Decrease your amlodipine to 1/2 tablet daily

## 2021-12-17 ENCOUNTER — Encounter: Payer: Self-pay | Admitting: Emergency Medicine

## 2021-12-17 ENCOUNTER — Inpatient Hospital Stay
Admission: EM | Admit: 2021-12-17 | Discharge: 2021-12-20 | DRG: 291 | Disposition: A | Discharge status: Home Health | Payer: Medicare PPO | Attending: Internal Medicine | Admitting: Internal Medicine

## 2021-12-17 ENCOUNTER — Emergency Department: Payer: Medicare PPO

## 2021-12-17 ENCOUNTER — Ambulatory Visit: Payer: Self-pay

## 2021-12-17 DIAGNOSIS — I4891 Unspecified atrial fibrillation: Secondary | ICD-10-CM | POA: Diagnosis present

## 2021-12-17 DIAGNOSIS — I2510 Atherosclerotic heart disease of native coronary artery without angina pectoris: Secondary | ICD-10-CM | POA: Diagnosis present

## 2021-12-17 DIAGNOSIS — I130 Hypertensive heart and chronic kidney disease with heart failure and stage 1 through stage 4 chronic kidney disease, or unspecified chronic kidney disease: Principal | ICD-10-CM | POA: Diagnosis present

## 2021-12-17 DIAGNOSIS — I890 Lymphedema, not elsewhere classified: Secondary | ICD-10-CM | POA: Diagnosis present

## 2021-12-17 DIAGNOSIS — Z79899 Other long term (current) drug therapy: Secondary | ICD-10-CM

## 2021-12-17 DIAGNOSIS — J449 Chronic obstructive pulmonary disease, unspecified: Secondary | ICD-10-CM | POA: Diagnosis present

## 2021-12-17 DIAGNOSIS — N184 Chronic kidney disease, stage 4 (severe): Secondary | ICD-10-CM | POA: Diagnosis present

## 2021-12-17 DIAGNOSIS — Z9049 Acquired absence of other specified parts of digestive tract: Secondary | ICD-10-CM

## 2021-12-17 DIAGNOSIS — Z880 Allergy status to penicillin: Secondary | ICD-10-CM

## 2021-12-17 DIAGNOSIS — E119 Type 2 diabetes mellitus without complications: Secondary | ICD-10-CM

## 2021-12-17 DIAGNOSIS — G4733 Obstructive sleep apnea (adult) (pediatric): Secondary | ICD-10-CM | POA: Diagnosis present

## 2021-12-17 DIAGNOSIS — Z7951 Long term (current) use of inhaled steroids: Secondary | ICD-10-CM

## 2021-12-17 DIAGNOSIS — K219 Gastro-esophageal reflux disease without esophagitis: Secondary | ICD-10-CM | POA: Diagnosis present

## 2021-12-17 DIAGNOSIS — I5041 Acute combined systolic (congestive) and diastolic (congestive) heart failure: Principal | ICD-10-CM

## 2021-12-17 DIAGNOSIS — Z7901 Long term (current) use of anticoagulants: Secondary | ICD-10-CM

## 2021-12-17 DIAGNOSIS — I428 Other cardiomyopathies: Secondary | ICD-10-CM | POA: Diagnosis present

## 2021-12-17 DIAGNOSIS — I10 Essential (primary) hypertension: Secondary | ICD-10-CM | POA: Diagnosis present

## 2021-12-17 DIAGNOSIS — E890 Postprocedural hypothyroidism: Secondary | ICD-10-CM | POA: Diagnosis present

## 2021-12-17 DIAGNOSIS — Z6834 Body mass index (BMI) 34.0-34.9, adult: Secondary | ICD-10-CM

## 2021-12-17 DIAGNOSIS — Z87891 Personal history of nicotine dependence: Secondary | ICD-10-CM

## 2021-12-17 DIAGNOSIS — R079 Chest pain, unspecified: Secondary | ICD-10-CM | POA: Diagnosis present

## 2021-12-17 DIAGNOSIS — E785 Hyperlipidemia, unspecified: Secondary | ICD-10-CM | POA: Diagnosis present

## 2021-12-17 DIAGNOSIS — I4892 Unspecified atrial flutter: Secondary | ICD-10-CM | POA: Diagnosis present

## 2021-12-17 DIAGNOSIS — Z8249 Family history of ischemic heart disease and other diseases of the circulatory system: Secondary | ICD-10-CM

## 2021-12-17 DIAGNOSIS — I5043 Acute on chronic combined systolic (congestive) and diastolic (congestive) heart failure: Secondary | ICD-10-CM | POA: Diagnosis present

## 2021-12-17 DIAGNOSIS — Z833 Family history of diabetes mellitus: Secondary | ICD-10-CM

## 2021-12-17 DIAGNOSIS — Z91119 Patient's noncompliance with dietary regimen due to unspecified reason: Secondary | ICD-10-CM

## 2021-12-17 DIAGNOSIS — I5022 Chronic systolic (congestive) heart failure: Secondary | ICD-10-CM | POA: Diagnosis present

## 2021-12-17 DIAGNOSIS — E1122 Type 2 diabetes mellitus with diabetic chronic kidney disease: Secondary | ICD-10-CM | POA: Diagnosis present

## 2021-12-17 DIAGNOSIS — E669 Obesity, unspecified: Secondary | ICD-10-CM | POA: Diagnosis present

## 2021-12-17 DIAGNOSIS — Z905 Acquired absence of kidney: Secondary | ICD-10-CM

## 2021-12-17 DIAGNOSIS — Z794 Long term (current) use of insulin: Secondary | ICD-10-CM

## 2021-12-17 DIAGNOSIS — Z7984 Long term (current) use of oral hypoglycemic drugs: Secondary | ICD-10-CM

## 2021-12-17 DIAGNOSIS — Z20822 Contact with and (suspected) exposure to covid-19: Secondary | ICD-10-CM | POA: Diagnosis present

## 2021-12-17 LAB — CBC
HCT: 34 % — ABNORMAL LOW (ref 39.0–52.0)
Hemoglobin: 10.8 g/dL — ABNORMAL LOW (ref 13.0–17.0)
MCH: 27.1 pg (ref 26.0–34.0)
MCHC: 31.8 g/dL (ref 30.0–36.0)
MCV: 85.2 fL (ref 80.0–100.0)
Platelets: 186 10*3/uL (ref 150–400)
RBC: 3.99 MIL/uL — ABNORMAL LOW (ref 4.22–5.81)
RDW: 14.9 % (ref 11.5–15.5)
WBC: 6.9 10*3/uL (ref 4.0–10.5)
nRBC: 0 % (ref 0.0–0.2)

## 2021-12-17 LAB — BASIC METABOLIC PANEL
Anion gap: 6 (ref 5–15)
BUN: 64 mg/dL — ABNORMAL HIGH (ref 8–23)
CO2: 20 mmol/L — ABNORMAL LOW (ref 22–32)
Calcium: 8.7 mg/dL — ABNORMAL LOW (ref 8.9–10.3)
Chloride: 113 mmol/L — ABNORMAL HIGH (ref 98–111)
Creatinine, Ser: 2.73 mg/dL — ABNORMAL HIGH (ref 0.61–1.24)
GFR, Estimated: 25 mL/min — ABNORMAL LOW (ref >60)
Glucose, Bld: 122 mg/dL — ABNORMAL HIGH (ref 70–99)
Potassium: 4.8 mmol/L (ref 3.5–5.1)
Sodium: 139 mmol/L (ref 135–145)

## 2021-12-17 LAB — BRAIN NATRIURETIC PEPTIDE: B Natriuretic Peptide: 122.8 pg/mL — ABNORMAL HIGH (ref 0.0–100.0)

## 2021-12-17 LAB — TROPONIN I (HIGH SENSITIVITY): Troponin I (High Sensitivity): 8 ng/L (ref <18)

## 2021-12-17 MED ORDER — FUROSEMIDE 10 MG/ML IJ SOLN
40.0000 mg | Freq: Once | INTRAMUSCULAR | Status: AC
  Administered 2021-12-18: 40 mg via INTRAVENOUS
  Filled 2021-12-17: qty 4

## 2021-12-17 MED ORDER — ACETAMINOPHEN 500 MG PO TABS
1000.0000 mg | ORAL_TABLET | Freq: Once | ORAL | Status: AC
  Administered 2021-12-17: 1000 mg via ORAL
  Filled 2021-12-17: qty 2

## 2021-12-17 MED ORDER — LIDOCAINE 5 % EX PTCH
1.0000 | MEDICATED_PATCH | CUTANEOUS | Status: DC
  Administered 2021-12-17: 1 via TRANSDERMAL
  Filled 2021-12-17: qty 1

## 2021-12-17 MED ORDER — IPRATROPIUM-ALBUTEROL 0.5-2.5 (3) MG/3ML IN SOLN
3.0000 mL | Freq: Once | RESPIRATORY_TRACT | Status: AC
  Administered 2021-12-17: 3 mL via RESPIRATORY_TRACT
  Filled 2021-12-17: qty 3

## 2021-12-17 NOTE — ED Triage Notes (Signed)
Pt reports CP that started before dinner today. Also complaining of left foot swelling. Nitro from EMS caused a headache. Central chest burning and SOB.

## 2021-12-17 NOTE — ED Provider Notes (Signed)
[facility] Provider Note    Event Date/Time   First MD Initiated Contact with Patient 12/17/21 2257     (approximate)   History   Chest Pain   HPI  Larry Callahan is a 66 y.o. male who presents to the ED for evaluation of Chest Pain   I review CHF clinic visit from yesterday.  Nonischemic cardiomyopathy with EF of 35%.  COPD, obesity and OSA, CKD 4, DM.  Anticoagulated on Eliquis due to Afib.      Physical Exam   Triage Vital Signs: ED Triage Vitals  Enc Vitals Group     BP 12/17/21 1858 131/83     Pulse Rate 12/17/21 1858 64     Resp 12/17/21 1858 18     Temp 12/17/21 1858 97.7 F (36.5 C)     Temp Source 12/17/21 1858 Oral     SpO2 12/17/21 1858 99 %     Weight 12/17/21 1857 255 lb 11.7 oz (116 kg)     Height 12/17/21 1857 '5\' 11"'$  (1.803 m)     Head Circumference --      Peak Flow --      Pain Score 12/17/21 1856 6     Pain Loc --      Pain Edu? --      Excl. in [place]? --     Most recent vital signs: Vitals:   12/17/21 1858  BP: 131/83  Pulse: 64  Resp: 18  Temp: 97.7 F (36.5 C)  SpO2: 99%    General: Awake, no distress. *** CV:  Good peripheral perfusion.  Resp:  Normal effort.  Abd:  No distention.  MSK:  No deformity noted.  Neuro:  No focal deficits appreciated. Other:     ED Results / Procedures / Treatments   Labs (all labs ordered are listed, but only abnormal results are displayed) Labs Reviewed  BASIC METABOLIC PANEL - Abnormal; Notable for the following components:      Result Value   Chloride 113 (*)    CO2 20 (*)    Glucose, Bld 122 (*)    BUN 64 (*)    Creatinine, Ser 2.73 (*)    Calcium 8.7 (*)    GFR, Estimated 25 (*)    All other components within normal limits  CBC - Abnormal; Notable for the following components:   RBC 3.99 (*)    Hemoglobin 10.8 (*)    HCT 34.0 (*)    All other components within normal limits  TROPONIN I (HIGH SENSITIVITY)  TROPONIN I (HIGH SENSITIVITY)     EKG A flutter with ventricular rate of 66 bpm.  Normal axis and intervals otherwise.  No clear signs of acute ischemia.  RADIOLOGY 2 view CXR interpreted by me with streaky left basilar opacity.  Official radiology report(s): DG Chest 2 View  Result Date: 12/17/2021 CLINICAL DATA:  Shortness of breath.  Chest pain. EXAM: CHEST - 2 VIEW COMPARISON:  Chest x-ray September 23, 2021. FINDINGS: Patient rotation. Chronic enlargement the cardiac silhouette. Tortuous aorta. Mild streaky left basilar opacities. No confluent consolidation. No visible pleural effusions or pneumothorax. Degenerative changes of the thoracic spine. No acute osseous abnormality. IMPRESSION: 1. Mild streaky left basilar opacities, favor atelectasis. 2. Chronic cardiomegaly. Electronically Signed   By: Margaretha Sheffield M.D.   On: 12/17/2021 19:51    PROCEDURES and INTERVENTIONS:  Procedures  Medications - No data to display   IMPRESSION / MDM / ASSESSMENT AND PLAN /  ED COURSE  I reviewed the triage vital signs and the nursing notes.  Differential diagnosis includes, but is not limited to, ***  {Patient presents with symptoms of an acute illness or injury that is potentially life-threatening.}      FINAL CLINICAL IMPRESSION(S) / ED DIAGNOSES   Final diagnoses:  None     Rx / DC Orders   ED Discharge Orders     None        Note:  This document was prepared using Dragon voice recognition software and may include unintentional dictation errors.

## 2021-12-17 NOTE — ED Triage Notes (Signed)
First nurse note: Patient arrived by EMS from home with c/o cp, cob, bilateral swelling and left eye pain. All started around noon today. EMS administered 324 aspirin, 2 nitro sublingual spray. Patient reports giving himself breathing treatment at home before EMS arrival.

## 2021-12-17 NOTE — ED Notes (Signed)
Pt to ED for CP that started today, that feels like a shock. States he has SOB, pt has COPD. Pt has hx of CHF.   Pt is A&Ox4.

## 2021-12-18 ENCOUNTER — Other Ambulatory Visit: Payer: Self-pay

## 2021-12-18 ENCOUNTER — Encounter: Payer: Self-pay | Admitting: Internal Medicine

## 2021-12-18 DIAGNOSIS — I483 Typical atrial flutter: Secondary | ICD-10-CM

## 2021-12-18 DIAGNOSIS — N184 Chronic kidney disease, stage 4 (severe): Secondary | ICD-10-CM

## 2021-12-18 DIAGNOSIS — R079 Chest pain, unspecified: Secondary | ICD-10-CM | POA: Diagnosis not present

## 2021-12-18 DIAGNOSIS — R072 Precordial pain: Secondary | ICD-10-CM | POA: Diagnosis not present

## 2021-12-18 DIAGNOSIS — J438 Other emphysema: Secondary | ICD-10-CM

## 2021-12-18 DIAGNOSIS — I5042 Chronic combined systolic (congestive) and diastolic (congestive) heart failure: Secondary | ICD-10-CM

## 2021-12-18 DIAGNOSIS — I5022 Chronic systolic (congestive) heart failure: Secondary | ICD-10-CM

## 2021-12-18 LAB — BASIC METABOLIC PANEL
Anion gap: 8 (ref 5–15)
BUN: 54 mg/dL — ABNORMAL HIGH (ref 8–23)
CO2: 21 mmol/L — ABNORMAL LOW (ref 22–32)
Calcium: 9.2 mg/dL (ref 8.9–10.3)
Chloride: 111 mmol/L (ref 98–111)
Creatinine, Ser: 2.41 mg/dL — ABNORMAL HIGH (ref 0.61–1.24)
GFR, Estimated: 29 mL/min — ABNORMAL LOW (ref >60)
Glucose, Bld: 109 mg/dL — ABNORMAL HIGH (ref 70–99)
Potassium: 4.3 mmol/L (ref 3.5–5.1)
Sodium: 140 mmol/L (ref 135–145)

## 2021-12-18 LAB — SARS CORONAVIRUS 2 BY RT PCR: SARS Coronavirus 2 by RT PCR: NEGATIVE

## 2021-12-18 LAB — TROPONIN I (HIGH SENSITIVITY): Troponin I (High Sensitivity): 8 ng/L (ref <18)

## 2021-12-18 MED ORDER — CARVEDILOL 12.5 MG PO TABS
12.5000 mg | ORAL_TABLET | Freq: Two times a day (BID) | ORAL | Status: DC
Start: 2021-12-18 — End: 2021-12-20
  Administered 2021-12-18 – 2021-12-20 (×5): 12.5 mg via ORAL
  Filled 2021-12-18 (×3): qty 2
  Filled 2021-12-18 (×2): qty 1

## 2021-12-18 MED ORDER — ASPIRIN 300 MG RE SUPP: 300.0000 mg | RECTAL | Status: AC

## 2021-12-18 MED ORDER — ASPIRIN 81 MG PO TBEC: 81.0000 mg | DELAYED_RELEASE_TABLET | Freq: Every day | ORAL | Status: DC

## 2021-12-18 MED ORDER — IPRATROPIUM-ALBUTEROL 0.5-2.5 (3) MG/3ML IN SOLN
3.0000 mL | Freq: Four times a day (QID) | RESPIRATORY_TRACT | Status: DC | PRN
  Administered 2021-12-18 – 2021-12-20 (×2): 3 mL via RESPIRATORY_TRACT
  Filled 2021-12-18 (×2): qty 3

## 2021-12-18 MED ORDER — ACETAMINOPHEN 325 MG PO TABS
650.0000 mg | ORAL_TABLET | ORAL | Status: DC | PRN
Start: 2021-12-18 — End: 2021-12-20
  Administered 2021-12-18 (×2): 650 mg via ORAL
  Filled 2021-12-18 (×2): qty 2

## 2021-12-18 MED ORDER — ASPIRIN 81 MG PO CHEW: 324.0000 mg | CHEWABLE_TABLET | ORAL | Status: AC

## 2021-12-18 MED ORDER — APIXABAN 5 MG PO TABS
5.0000 mg | ORAL_TABLET | Freq: Two times a day (BID) | ORAL | Status: DC
  Administered 2021-12-18 – 2021-12-20 (×5): 5 mg via ORAL
  Filled 2021-12-18 (×5): qty 1

## 2021-12-18 MED ORDER — HYDRALAZINE HCL 50 MG PO TABS
50.0000 mg | ORAL_TABLET | Freq: Three times a day (TID) | ORAL | Status: DC
  Administered 2021-12-18 – 2021-12-20 (×7): 50 mg via ORAL
  Filled 2021-12-18 (×7): qty 1

## 2021-12-18 MED ORDER — FUROSEMIDE 40 MG PO TABS
20.0000 mg | ORAL_TABLET | Freq: Every day | ORAL | Status: DC
  Administered 2021-12-18 – 2021-12-19 (×2): 20 mg via ORAL
  Filled 2021-12-18 (×2): qty 1

## 2021-12-18 MED ORDER — ROSUVASTATIN CALCIUM 10 MG PO TABS
10.0000 mg | ORAL_TABLET | Freq: Every day | ORAL | Status: DC
  Administered 2021-12-18 – 2021-12-19 (×2): 10 mg via ORAL
  Filled 2021-12-18 (×3): qty 1

## 2021-12-18 MED ORDER — GABAPENTIN 100 MG PO CAPS
100.0000 mg | ORAL_CAPSULE | Freq: Two times a day (BID) | ORAL | Status: DC
  Administered 2021-12-18 – 2021-12-20 (×5): 100 mg via ORAL
  Filled 2021-12-18 (×5): qty 1

## 2021-12-18 MED ORDER — NITROGLYCERIN 0.4 MG SL SUBL: 0.4000 mg | SUBLINGUAL_TABLET | SUBLINGUAL | Status: DC | PRN

## 2021-12-18 MED ORDER — POTASSIUM CHLORIDE CRYS ER 20 MEQ PO TBCR
20.0000 meq | EXTENDED_RELEASE_TABLET | Freq: Every day | ORAL | Status: DC
  Administered 2021-12-18 – 2021-12-19 (×2): 20 meq via ORAL
  Filled 2021-12-18 (×2): qty 1

## 2021-12-18 MED ORDER — ONDANSETRON HCL 4 MG/2ML IJ SOLN: 4.0000 mg | Freq: Four times a day (QID) | INTRAMUSCULAR | Status: DC | PRN

## 2021-12-18 MED ORDER — LIDOCAINE 5 % EX PTCH
1.0000 | MEDICATED_PATCH | CUTANEOUS | Status: DC
  Administered 2021-12-19 – 2021-12-20 (×2): 1 via TRANSDERMAL
  Filled 2021-12-18 (×2): qty 1

## 2021-12-18 NOTE — ED Notes (Signed)
This RN and another staff attempted blood draw x 2 without success, lab called to assist with blood draw.

## 2021-12-18 NOTE — Assessment & Plan Note (Addendum)
CAD Troponin 8 and EKG nonacute but chest pain is persistent.   We will trend troponin Has been treated medically in the past Continue aspirin, statins, beta-blocker and nitrates Cardiology consult for further risk stratification

## 2021-12-18 NOTE — Assessment & Plan Note (Signed)
-   Not acutely exacerbated - Albuterol as needed, home inhalers

## 2021-12-18 NOTE — Assessment & Plan Note (Signed)
Renal function at baseline 

## 2021-12-18 NOTE — Assessment & Plan Note (Addendum)
-   Not acutely exacerbated -Left ventricular ejection fraction, by estimation, is 40 to 45%, 07/2021 - Continue carvedilol, furosemide, Imdur.  Currently on Entresto.

## 2021-12-18 NOTE — ED Notes (Addendum)
Pt ambulating to toilet without assist, gait steady, pt sitting in recliner after using toilet. Pt asking for food. MD messaged about possible diet order  for pt.

## 2021-12-18 NOTE — H&P (Signed)
History and Physical    Patient: Larry Callahan OBS:[id number] DOB: Nov 11, 1955 DOA: 12/17/2021 DOS: the patient was seen and examined on 12/18/2021 PCP: Lavera Guise, MD  Patient coming from: Home  Chief Complaint:  Chief Complaint  Patient presents with   Chest Pain    HPI: Larry Callahan is a 66 y.o. male with medical history significant for CAD with moderate risk Myoview on 09/2020, CKD 3B, HTN, DM, COPD, OSA on CPAP, systolic heart failure, last EF 40-45% 07/2021, atrial flutter on Eliquis who presents to the ED with chest pain while at rest that started 12 hours prior to arrival.  He had no associated nausea, vomiting or diaphoresis.  Had no shortness of breath beyond his baseline. ED course and data review: Vitals within normal limits. Labs: Troponin 8, BNP 122.  Creatinine 2.73 which is his baseline.  WBC 6.9 and hemoglobin 10.8. EKG personally read and interpreted, nonacute  Chest x-ray with mild streaky left basilar opacities favor atelectasis and chronic cardiomegaly  Patient treated with Lasix and a DuoNeb.  Hospitalist consulted for admission for persistent chest pain     Past Medical History:  Diagnosis Date   Allergy    Atrial flutter ([place])    a. Dx 12/2020--CHA2DS2VASc = 5-->eliquis.   Chronic combined systolic (congestive) and diastolic (congestive) heart failure ([place])    a. 04/2019 Echo: EF 45-50%; b. 02/2019 Echo: EF 55-60%; c. 09/2020 Echo: EF 35-40%, glob HK. Nl RV size/fxn. Mild MR; d. 07/2021 Echo: EF 40-45%, mild LVH, low-nl RV fxn, no MR.   CKD (chronic kidney disease), stage III (HCC)    COPD (chronic obstructive pulmonary disease) ([place])    Coronary artery disease    a. 2011 Cath: nonobs dzs; b. 09/2020 MV: EF 38%. No signif ischemia. ? inf MI vs diaph attenuation. GI uptake artifact.   Diabetes mellitus without complication ([place])    GERD (gastroesophageal reflux disease)    Hyperlipidemia    Hypertension    Morbid obesity ([place])    NICM  (nonischemic cardiomyopathy) ([place])    a. 04/2019 Echo: EF 45-50%; b. 02/2019 Echo: EF 55-60%; c. 09/2020 Echo: EF 35-40%, glob HK; d. 09/2020 MV: No ischemia. ? inf infarct vs attenuation; d. 07/2021 Echo: EF 40-45%.   OSA (obstructive sleep apnea)    Stage 3b chronic kidney disease ([place])    Past Surgical History:  Procedure Laterality Date   BACK SURGERY     CARDIAC CATHETERIZATION     CHOLECYSTECTOMY     COLONOSCOPY WITH PROPOFOL N/A 04/12/2019   Procedure: COLONOSCOPY WITH PROPOFOL;  Surgeon: [name], MD;  Location: [facility] ENDOSCOPY;  Service: Gastroenterology;  Laterality: N/A;   ESOPHAGOGASTRODUODENOSCOPY N/A 04/12/2019   Procedure: ESOPHAGOGASTRODUODENOSCOPY (EGD);  Surgeon: [name], MD;  Location: [facility] ENDOSCOPY;  Service: Gastroenterology;  Laterality: N/A;   ESOPHAGOGASTRODUODENOSCOPY (EGD) WITH PROPOFOL N/A 11/21/2019   Procedure: ESOPHAGOGASTRODUODENOSCOPY (EGD) WITH PROPOFOL;  Surgeon: [name], MD;  Location: [facility] END[facility]COPY;  Service: Gastroenterology;  Laterality: N/A;   FLEXIBLE BRONCHOSCOPY Bilateral 05/17/2019   Procedure: FLEXIBLE BRONCHOSCOPY;  Surgeon: [name], MD;  Location: [name] ORS;  Service: Pulmonary;  Laterality: Bilateral;   left arm surgery     nephrectomy Left    PARATHYROIDECTOMY     RIGHT HEART CATH N/A 03/29/2019   Procedure: RIGHT HEART CATH;  Surgeon: [name], MD;  Location: [facility] LAB;  Service: Cardiovascular;  Laterality: N/A;   Social History:  reports that he has quit  smoking. His smoking use included cigarettes. He has quit using smokeless tobacco.  His smokeless tobacco use included chew. He reports that he does not drink alcohol and does not use drugs.  Allergies  Allergen Reactions   Penicillins Rash    Family History  Problem Relation Age of Onset   Diabetes Mother        2003   Lung cancer Father    Diabetes Sister    Hypertension Sister    Heart disease Sister    Cancer Sister     Bone cancer Brother     Prior to Admission medications   Medication Sig Start Date End Date Taking? Authorizing Provider  Accu-Chek Softclix Lancets lancets Use to check blood sugars 4 times a day   E11.65 08/20/21   [name], NP  amLODipine (NORVASC) 10 MG tablet Take 1 tablet (10 mg total) by mouth daily. Patient taking differently: Take 5 mg by mouth daily. 11/29/21   [name], NP  Blood Glucose Monitoring Suppl (TRUE METRIX AIR GLUCOSE METER) DEVI 1 Device by Does not apply route 2 (two) times daily. 04/13/18   [name], NP  Budeson-Glycopyrrol-Formoterol (BREZTRI AEROSPHERE) 160-9-4.8 MCG/ACT AERO Inhale into the lungs. Samples given.... 2 puffs in the morning and 2 puffs at bedtime    [provider]  carvedilol (COREG) 12.5 MG tablet Take 1 tablet (12.5 mg total) by mouth 2 (two) times daily. 01/07/21 01/07/22  Arta Silence, MD  cetirizine (ZYRTEC) 10 MG tablet Take 10 mg by mouth daily as needed for allergies. Buys OTC    [provider]  cyanocobalamin 1000 MCG tablet Take 1 tablet (1,000 mcg total) by mouth daily. 12/13/21   Lavera Guise, MD  ELIQUIS 2.5 MG TABS tablet Take 1 tablet (2.5 mg total) by mouth 2 (two) times daily. 12/12/21   Lavera Guise, MD  furosemide (LASIX) 40 MG tablet Take 2 tabs M/W/F AND one tab rest of the week 11/02/21   Lavera Guise, MD  gabapentin (NEURONTIN) 100 MG capsule Take 1 capsule (100 mg total) by mouth in the morning, at noon, and at bedtime. 08/11/21   [name], NP  glimepiride (AMARYL) 2 MG tablet Take one tab po bid with meals 10/11/21   Lavera Guise, MD  glucose blood (ACCU-CHEK GUIDE) test strip Use to check blood sugars 4 times a day   E11.65 12/13/21   Lavera Guise, MD  hydrALAZINE (APRESOLINE) 50 MG tablet Take 1 tablet (50 mg total) by mouth 3 (three) times daily. 10/11/21   Lavera Guise, MD  insulin glargine, 2 Unit Dial, (TOUJEO MAX SOLOSTAR) 300 UNIT/ML Solostar Pen Inject 20 units in am  11/02/21   Lavera Guise, MD  ipratropium-albuterol (DUONEB) 0.5-2.5 (3) MG/3ML SOLN Take 3 mLs by nebulization every 6 (six) hours as needed. 06/29/21   Danford, Suann Larry, MD  NOVOFINE PEN NEEDLE 32G X 6 MM MISC Use as directed 12/12/21   Lavera Guise, MD  omeprazole (PRILOSEC) 40 MG capsule Take 1 capsule once in the morning and once in the evening 12/13/21   Lavera Guise, MD  potassium chloride SA (KLOR-CON M) 20 MEQ tablet Take 1 tablet (20 mEq total) by mouth daily. 12/16/21   Alisa Graff, FNP  rosuvastatin (CRESTOR) 10 MG tablet Take 1 tablet (10 mg total) by mouth at bedtime. 08/11/21   [name], NP  sacubitril-valsartan (ENTRESTO) 24-26 MG Take 1 tablet by mouth 2 (two) times daily.  11/04/21   Alisa Graff, FNP    Physical Exam: Vitals:   12/17/21 1857 12/17/21 1858 12/17/21 2338 12/18/21 0030  BP:  131/83 (!) 162/95 (!) 149/87  Pulse:  64 63 63  Resp:  _0 Temp:  97.7 F (36.5 C)    TempSrc:  Oral    SpO2:  99% 99% 97%  Weight: 116 kg     Height: _1  (1.803 m)      Physical Exam Vitals and nursing note reviewed.  Constitutional:      General: He is not in acute distress. HENT:     Head: Normocephalic and atraumatic.  Cardiovascular:     Rate and Rhythm: Normal rate and regular rhythm.     Heart sounds: Normal heart sounds.  Pulmonary:     Effort: Pulmonary effort is normal.     Breath sounds: Normal breath sounds.  Abdominal:     Palpations: Abdomen is soft.     Tenderness: There is no abdominal tenderness.  Neurological:     Mental Status: Mental status is at baseline.     Labs on Admission: I have personally reviewed following labs and imaging studies  CBC: Recent Labs  Lab 12/17/21 1903  WBC 6.9  HGB 10.8*  HCT 34.0*  MCV 85.2  PLT 415   Basic Metabolic Panel: Recent Labs  Lab 12/16/21 1035 12/17/21 1903  NA 138 139  K 5.1 4.8  CL 111 113*  CO2 20* 20*  GLUCOSE 91 122*  BUN 63* 64*  CREATININE 2.81* 2.73*  CALCIUM 9.0  8.7*   GFR: Estimated Creatinine Clearance: 33.6 mL/min (A) (by C-G formula based on SCr of 2.73 mg/dL (H)). Liver Function Tests: No results for input(s): "AST", "ALT", "ALKPHOS", "BILITOT", "PROT", "ALBUMIN" in the last 168 hours. No results for input(s): "LIPASE", "AMYLASE" in the last 168 hours. No results for input(s): "AMMONIA" in the last 168 hours. Coagulation Profile: No results for input(s): "INR", "PROTIME" in the last 168 hours. Cardiac Enzymes: No results for input(s): "CKTOTAL", "CKMB", "CKMBINDEX", "TROPONINI" in the last 168 hours. BNP (last 3 results) No results for input(s): "PROBNP" in the last 8760 hours. HbA1C: No results for input(s): "HGBA1C" in the last 72 hours. CBG: No results for input(s): "GLUCAP" in the last 168 hours. Lipid Profile: No results for input(s): "CHOL", "HDL", "LDLCALC", "TRIG", "CHOLHDL", "LDLDIRECT" in the last 72 hours. Thyroid Function Tests: No results for input(s): "TSH", "T4TOTAL", "FREET4", "T3FREE", "THYROIDAB" in the last 72 hours. Anemia Panel: No results for input(s): "VITAMINB12", "FOLATE", "FERRITIN", "TIBC", "IRON", "RETICCTPCT" in the last 72 hours. Urine analysis:    Component Value Date/Time   COLORURINE YELLOW 06/28/2021 1120   APPEARANCEUR CLEAR (A) 06/28/2021 1120   APPEARANCEUR Clear 03/23/2021 1448   LABSPEC 1.020 06/28/2021 1120   LABSPEC 1.005 03/06/2013 0929   PHURINE 5.0 06/28/2021 1120   GLUCOSEU 500 (A) 06/28/2021 1120   GLUCOSEU Negative 03/06/2013 0929   HGBUR NEGATIVE 06/28/2021 1120   BILIRUBINUR NEGATIVE 06/28/2021 1120   BILIRUBINUR Negative 03/23/2021 1448   BILIRUBINUR Negative 03/06/2013 0929   KETONESUR NEGATIVE 06/28/2021 1120   PROTEINUR >300 (A) 06/28/2021 1120   UROBILINOGEN 0.2 02/20/2019 1404   NITRITE NEGATIVE 06/28/2021 1120   LEUKOCYTESUR NEGATIVE 06/28/2021 1120   LEUKOCYTESUR Negative 03/06/2013 0929    Radiological Exams on Admission: DG Chest 2 View  Result Date:  12/17/2021 CLINICAL DATA:  Shortness of breath.  Chest pain. EXAM: CHEST - 2 VIEW COMPARISON:  Chest x-ray September 23, 2021. FINDINGS: Patient rotation. Chronic enlargement the cardiac silhouette. Tortuous aorta. Mild streaky left basilar opacities. No confluent consolidation. No visible pleural effusions or pneumothorax. Degenerative changes of the thoracic spine. No acute osseous abnormality. IMPRESSION: 1. Mild streaky left basilar opacities, favor atelectasis. 2. Chronic cardiomegaly. Electronically Signed   By: Margaretha Sheffield M.D.   On: 12/17/2021 19:51     Data Reviewed: Relevant notes from primary care and specialist visits, past discharge summaries as available in EHR, including Care Everywhere. Prior diagnostic testing as pertinent to current admission diagnoses Updated medications and problem lists for reconciliation ED course, including vitals, labs, imaging, treatment and response to treatment Triage notes, nursing and pharmacy notes and ED provider's notes Notable results as noted in HPI   Assessment and Plan: * Chest pain CAD Troponin 8 and EKG nonacute but chest pain is persistent.   We will trend troponin Has been treated medically in the past Continue aspirin, statins, beta-blocker and nitrates Cardiology consult for further risk stratification  Atrial flutter ([place]) - Continue carvedilol - Continue Eliquis  Chronic systolic CHF (congestive heart failure) ([place]) - Not acutely exacerbated -Left ventricular ejection fraction, by estimation, is 40 to 45%, 07/2021 - Continue carvedilol, furosemide, Imdur.  Currently on Entresto.  COPD (chronic obstructive pulmonary disease) (HCC) - Not acutely exacerbated - Albuterol as needed, home inhalers  Essential hypertension - Continue amlodipine, hydralazine and furosemide  CKD (chronic kidney disease), stage IV (HCC) - Renal function at baseline  Diabetes mellitus type II, controlled ([place]) Sliding scale insulin  coverage        DVT prophylaxis: Eliquis  Consults: [facility] cardiology  Advance Care Planning:   Code Status: Prior   Family Communication: none  Disposition Plan: Back to previous home environment  Severity of Illness: The appropriate patient status for this patient is OBSERVATION. Observation status is judged to be reasonable and necessary in order to provide the required intensity of service to ensure the patient's safety. The patient's presenting symptoms, physical exam findings, and initial radiographic and laboratory data in the context of their medical condition is felt to place them at decreased risk for further clinical deterioration. Furthermore, it is anticipated that the patient will be medically stable for discharge from the hospital within 2 midnights of admission.   Author: Athena Masse, MD 12/18/2021 2:31 AM  For on call review [email or web address].

## 2021-12-18 NOTE — Progress Notes (Addendum)
Cardiology Consult    Patient ID: Larry Callahan MRN: [id number], DOB/AGE: [birth year]   Admit date: 12/17/2021 Date of Consult: 12/18/2021  Primary Physician: Lavera Guise, MD Primary Cardiologist: [name], MD Requesting Provider: Chauncey Cruel. Kurtis Bushman, MD  Patient Profile    Larry Callahan is a 66 y.o. male with a history of chronic combined syst/diast CHF, NICM, CK III-IV, COPD, nonobs CAD, DMII, persistent atrial flutter (eliquis), GERD, HTN, HL, obesity, and OSA, who is being seen today for the evaluation of chest pain w/ nl troponins at the request of Dr. Kurtis Bushman.  Past Medical History   Past Medical History:  Diagnosis Date   Allergy    Atrial flutter ([place])    a. Dx 12/2020--CHA2DS2VASc = 5-->eliquis.   Chronic combined systolic (congestive) and diastolic (congestive) heart failure ([place])    a. 04/2019 Echo: EF 45-50%; b. 02/2019 Echo: EF 55-60%; c. 09/2020 Echo: EF 35-40%, glob HK. Nl RV size/fxn. Mild MR; d. 07/2021 Echo: EF 40-45%, mild LVH, low-nl RV fxn, no MR.   CKD (chronic kidney disease), stage III (HCC)    COPD (chronic obstructive pulmonary disease) ([place])    Coronary artery disease    a. 2011 Cath: nonobs dzs; b. 09/2020 MV: EF 38%. No signif ischemia. ? inf MI vs diaph attenuation. GI uptake artifact.   Diabetes mellitus without complication ([place])    GERD (gastroesophageal reflux disease)    Hyperlipidemia    Hypertension    Morbid obesity ([place])    NICM (nonischemic cardiomyopathy) ([place])    a. 04/2019 Echo: EF 45-50%; b. 02/2019 Echo: EF 55-60%; c. 09/2020 Echo: EF 35-40%, glob HK; d. 09/2020 MV: No ischemia. ? inf infarct vs attenuation; d. 07/2021 Echo: EF 40-45%.   OSA (obstructive sleep apnea)    Stage 3b chronic kidney disease ([place])     Past Surgical History:  Procedure Laterality Date   BACK SURGERY     CARDIAC CATHETERIZATION     CHOLECYSTECTOMY     COLONOSCOPY WITH PROPOFOL N/A 04/12/2019   Procedure: COLONOSCOPY WITH PROPOFOL;  Surgeon: [name], MD;  Location: [facility] ENDOSCOPY;  Service: Gastroenterology;  Laterality: N/A;   ESOPHAGOGASTRODUODENOSCOPY N/A 04/12/2019   Procedure: ESOPHAGOGASTRODUODENOSCOPY (EGD);  Surgeon: [name], MD;  Location: [facility] ENDOSCOPY;  Service: Gastroenterology;  Laterality: N/A;   ESOPHAGOGASTRODUODENOSCOPY (EGD) WITH PROPOFOL N/A 11/21/2019   Procedure: ESOPHAGOGASTRODUODENOSCOPY (EGD) WITH PROPOFOL;  Surgeon: [name], MD;  Location: [facility] ENDOSCOPY;  Service: Gastroenterology;  Laterality: N/A;   FLEXIBLE BRONCHOSCOPY Bilateral 05/17/2019   Procedure: FLEXIBLE BRONCHOSCOPY;  Surgeon: [name], MD;  Location: [name] ORS;  Service: Pulmonary;  Laterality: Bilateral;   left arm surgery     nephrectomy Left    PARATHYROIDECTOMY     RIGHT HEART CATH N/A 03/29/2019   Procedure: RIGHT HEART CATH;  Surgeon: [name], MD;  Location: [place] CV LAB;  Service: Cardiovascular;  Laterality: N/A;     Allergies  Allergies  Allergen Reactions   Penicillins Rash    History of Present Illness    66 y/o ? w/ the above complex PMH including chronic combined systolic diastolic congestive heart failure, nonischemic cardiomyopathy, persistent atrial flutter, stage III-IV chronic kidney disease, COPD, nonobstructive CAD, diabetes, GERD, hypertension, hyperlipidemia, obesity, and sleep apnea.  He was prev admitted in 09/2020 w/ chest pain and found to have an EF of 35-40% by echo. Stress testing showed no ischemia w/ question of inferior infarct, though this was felt to be attenuation  artifact and he was medically mananed (GDMT limited by CKD III-IV).  In 12/2020, he was seen in the ED w/ palpitations and new onset afib.  ? blocker was titrated and he was placed on eliquis.  He subsequently developed rate-controlled atrial flutter, and we rec outpt DCCV, however, he failed to f/u w/ general cardiology.  He had multiple hospitalizations throughout late 2022 and early 2023  (arm  cellulitis 02/2021; COPD 06/2021; CHF 07/2021; atypical c/p 09/2021 x 2).  Most recent echo in 07/2021 showed EF of 40-45% w/ global HK and nl RV fxn.  During chest pain admission in 09/2021, conservative rx was advised in the setting of atypical symptoms and lack of objective evidence of ischemia.  He was intermittently compliant w/ eliquis at that time, and therefor, outpt f/u and reconsideration of DCCW was advised.  Unfortunately, he never f/u with [name].  In 11/2021, he was admitted for right periorbital cellulitis, which was managed w/ abx, and he was subsequently d/c'd on 6/24.  He f/u in CHF clinic on 7/6, at which time it was reported that his wt was up 2 lbs w/ 1+ bilat lower ext edema, in the setting of frequent fast food.  Meds were cont w/o change.  On July 7, Mr. Pope was sitting around noon, and had sudden onset of 6/10 retrosternal chest discomfort and soreness, that was slightly worse with palpation.  He felt mildly dyspneic and used a breathing treatment without any improvement in chest pain.  As symptoms persisted, he eventually presented to the emergency department where ECG showed atrial flutter at 66 bpm without acute ST or T changes.  Labs notable for slight elevation in creatinine above baseline, at 2.73, BNP of 122.8, normal troponin x2, and stable normocytic anemia at 10.8/34.0.  Chest x-ray showed mild streaky left basilar opacities favoring atelectasis with chronic cardiomegaly.  He was given a dose of IV Lasix in the setting of mild lower extremity edema.  This morning, he continues to report 6/10 chest discomfort, that is slightly worse with palpation over his precordium.  He thinks he is compliant with his home medications but has no awareness of what those medications are.  He says the reason he has not followed up with general cardiology is because of a lack of transportation.  Inpatient Medications     aspirin  324 mg Oral NOW   Or   aspirin  300 mg Rectal NOW   [START ON  12/19/2021] aspirin EC  81 mg Oral Daily   carvedilol  12.5 mg Oral BID WC   furosemide  20 mg Oral Daily   gabapentin  100 mg Oral BID   hydrALAZINE  50 mg Oral TID   lidocaine  1 patch Transdermal Q24H   potassium chloride SA  20 mEq Oral Daily   rosuvastatin  10 mg Oral QHS    Family History    Family History  Problem Relation Age of Onset   Diabetes Mother        2003   Lung cancer Father    Diabetes Sister    Hypertension Sister    Heart disease Sister    Cancer Sister    Bone cancer Brother    He indicated that his mother is deceased. He indicated that his father is deceased. He indicated that the status of his sister is unknown. He indicated that the status of his brother is unknown.   Social History    Social History   Socioeconomic History  Marital status: Single    Spouse name: Not on file   Number of children: Not on file   Years of education: Not on file   Highest education level: Not on file  Occupational History   Not on file  Tobacco Use   Smoking status: Former    Types: Cigarettes   Smokeless tobacco: Former    Types: Nurse, children's Use: Never used  Substance and Sexual Activity   Alcohol use: No   Drug use: No   Sexual activity: Not Currently  Other Topics Concern   Not on file  Social History Narrative   Not on file   Social Determinants of Health   Financial Resource Strain: Low Risk  (05/12/2021)   Overall Financial Resource Strain (CARDIA)    Difficulty of Paying Living Expenses: Not very hard  Food Insecurity: Not on file  Transportation Needs: Not on file  Physical Activity: Not on file  Stress: Not on file  Social Connections: Not on file  Intimate Partner Violence: Not on file     Review of Systems    General:  No chills, fever, night sweats or weight changes.  Cardiovascular:  +++ chest pain, +++ chronic dyspnea on exertion, +++ chronic lower extremity edema, no orthopnea, palpitations, paroxysmal nocturnal  dyspnea. Dermatological: No rash, lesions/masses Respiratory: No cough, +++ chronic dyspnea Urologic: No hematuria, dysuria Abdominal:   No nausea, vomiting, diarrhea, bright red blood per rectum, melena, or hematemesis Neurologic:  No visual changes, wkns, changes in mental status. All other systems reviewed and are otherwise negative except as noted above.  Physical Exam    Blood pressure (!) 149/87, pulse 63, temperature 97.7 F (36.5 C), temperature source Oral, resp. rate 17, height _0  (1.803 m), weight 116 kg, SpO2 97 %.  General: Pleasant, NAD Psych: Normal affect. Neuro: Alert and oriented X 3. Moves all extremities spontaneously. HEENT: Normal  Neck: Supple without bruits or JVD. Lungs:  Resp regular and unlabored, CTA. Heart: Irregularly irregular, no s3, s4, or murmurs. Abdomen: Obese, soft, non-tender, non-distended, BS + x 4.  Extremities: No clubbing, cyanosis or 1+ bilateral ankle edema. DP/PT2+, Radials 2+ and equal bilaterally.  Labs    Cardiac Enzymes Recent Labs  Lab 12/17/21 1903 12/17/21 2343  TROPONINIHS 8 8     BNP    Component Value Date/Time   BNP 122.8 (H) 12/17/2021 1903    Lab Results  Component Value Date   WBC 6.9 12/17/2021   HGB 10.8 (L) 12/17/2021   HCT 34.0 (L) 12/17/2021   MCV 85.2 12/17/2021   PLT 186 12/17/2021    Recent Labs  Lab 12/17/21 1903  NA 139  K 4.8  CL 113*  CO2 20*  BUN 64*  CREATININE 2.73*  CALCIUM 8.7*  GLUCOSE 122*   Lab Results  Component Value Date   CHOL 131 10/07/2020   HDL 40 (L) 10/07/2020   LDLCALC 63 10/07/2020   TRIG 142 10/07/2020   Lab Results  Component Value Date   DDIMER 1.91 (H) 06/27/2021   Lab Results  Component Value Date   HGBA1C 7.0 (H) 08/06/2021      Radiology Studies    DG Chest 2 View  Result Date: 12/17/2021 CLINICAL DATA:  Shortness of breath.  Chest pain. EXAM: CHEST - 2 VIEW COMPARISON:  Chest x-ray September 23, 2021. FINDINGS: Patient rotation. Chronic  enlargement the cardiac silhouette. Tortuous aorta. Mild streaky left basilar opacities. No confluent consolidation. No visible  pleural effusions or pneumothorax. Degenerative changes of the thoracic spine. No acute osseous abnormality. IMPRESSION: 1. Mild streaky left basilar opacities, favor atelectasis. 2. Chronic cardiomegaly. Electronically Signed   By: Margaretha Sheffield M.D.   On: 12/17/2021 19:51   CT ORBITS WO CONTRAST  Result Date: 11/30/2021 CLINICAL DATA:  Asymptomatic right eye swelling. EXAM: CT ORBITS WITHOUT CONTRAST TECHNIQUE: Multidetector CT imaging of the orbits was performed using the standard protocol without intravenous contrast. Multiplanar CT image reconstructions were also generated. RADIATION DOSE REDUCTION: This exam was performed according to the departmental dose-optimization program which includes automated exposure control, adjustment of the mA and/or kV according to patient size and/or use of iterative reconstruction technique. COMPARISON:  None Available. FINDINGS: Orbits: No orbital mass is identified. Normal appearance of the globes, optic nerve-sheath complexes, extraocular muscles, orbital fat and lacrimal glands. Visible paranasal sinuses: Clear. Soft tissues: There is moderate to marked severity right-sided preseptal and right supra orbital soft tissue swelling. Osseous: No fracture or aggressive lesion. Limited intracranial: No acute or significant finding. IMPRESSION: Moderate to marked severity right-sided preseptal and right supra orbital soft tissue swelling which may represent sequelae associated with preorbital cellulitis. Ophthalmology consultation is recommended. Electronically Signed   By: Virgina Norfolk M.D.   On: 11/30/2021 19:56    ECG & Cardiac Imaging    Atrial flutter, 66, no acute ST/T changes - personally reviewed.  Assessment & Plan    1.  Chest pain/nonobs CAD:  Long h/o atypical c/p w/o objective evidence of ischemia.  Prior cath in 2011 w/  nonobs CAD and more recent myoview in 09/2020, which was nonischemic w/ question of inferior infarct vs attenuation artifact.  Most recently admitted in 09/2021 w/ atypical chest pain and nl HsTroponins.  He was in his usual state of health on July 7, when he developed retrosternal chest discomfort around noon, which has been slightly worse with palpation.  Symptoms have been constant over the past 20 hours and despite this, ECG is unremarkable while troponins are normal x2.  In the absence of objective evidence of ischemia, no plan for further cardiovascular evaluation during this hospitalization.  I have encouraged him to keep his outpatient general cardiology follow-up and perhaps we will need to work with the heart failure clinic and transition of care team to ensure that he has appropriate transportation.  Continue home doses of beta-blocker and statin therapy.  2.  Chronic combined syst/diast CHF:  EF 40-45% w/ glob HK in 07/2021.  He has some degree of chronic, mild lower extremity swelling and Lasix 80 mg on Mondays, Wednesdays, and Fridays, and 40 mg all other days.  He notes chronic dyspnea on exertion and complains of worsening swelling in his ankles.  Per outpatient heart failure clinic note, he continues to have dietary indiscretions with frequent fast food/[employer].  On examination, he has 1+ bilateral ankle edema.  He was given a dose of intravenous Lasix overnight and is currently ordered 20 mg daily.  His creatinine is slightly higher than typical baseline.  Suspect he would benefit from compression hose and regular leg elevation as opposed to more Lasix.  Continue home doses of beta-blocker, hydralazine, and Entresto.  In the setting of CKD, difficulty with follow-up labs due to transportation issues, and recent mild hyperkalemia, he is a poor candidate for MRA.  With creatinine clearance hovering around 30, he is a poor candidate for SGLT2 inhibitor.  3.  Essential HTN: BP currently elevated.   Resume home meds, follow.  4.  HL: LDL of 63 last year.  On rosuvastatin @ home.  5.  CKD III-IV: Creat sl above baseline @ 2.81  2.73 this AM (baseline ~ 2.3).  Follow during hospitalization.  Continue Entresto.  He is on Lasix 80 mg Monday Wednesday Friday with 40 mg the rest of the week at home.  6.  DMII:  A1c 7.0 in February.  Lantus @ home.  Per IM.  Poor candidate for SGLT 2 inhibitor secondary to CKD.  7.  Persistent Atrial flutter:  Persistent dating back to 12/2020.  Rate-controlled on carvedilol therapy.  Not likely contributing to chest pain and unclear impact on heart failure symptoms.  At this point, I suspect he has permanent.  He has been anticoagulated with Eliquis, though I note the dose has been 2.5 mg twice daily.  Though his creatinine is elevated, he is less than 52 years old and his weight is well above 60 kg.  He should be appropriately dosed at 5 mg twice daily.  8.  OSA:  Uses CPAP.   Signed, Murray Hodgkins, NP 12/18/2021, 7:13 AM  Patient seen/examined   I agree with findings as noted by Angelica Ran above    Pt is a 66 yo with hx of HFrEF, CAD, Persistent atrial flutter, CKD, COPD DM, HTN, OSA.  Hx of CP in past   Myoview in 2022 showed question inferior infarct vs attenuation.  No ischemia    Last echo in Feb 2023 LVEF 40 to 45% with global hypokinesis.   The pt was seen in CHF on 12/16/21  Had some edema at the time   Admitted to dietary noncompliance.     On 7/7 developed CP/pressure, worse some with palpation.  Came to ED  Currently he notes some chest pressure, points to low sternum, epigastrum   Also says his breathing is a little short   Wants a breathing treatment  On exam, pt sitting in chair.  Does not appear uncomfortable Neck is full  No obvious JVD Lungs are CTA  No wheezes Cardiac exam Irreg irreg  No S3  No murmurs Chest is tender in lower sternal region  Brings on pain   Abd is obese   Supple nontender Ext with 1+ edema  Chest pain Atypical   I do  not think it represents ischemia   Appears muscular SOB   He does have some extra fluid on exam    Given lasix overnight.   Ordered daily    Resume home meds   Would use compression hose  Will follow with you   No cardiac testing planned. Will make sure he has continued follow up in clinic  Dorris Carnes MD    For questions or updates, please contact   Please consult [email or web address] for contact info under Cardiology/STEMI.

## 2021-12-18 NOTE — Assessment & Plan Note (Addendum)
-   Continue carvedilol - Continue Eliquis

## 2021-12-18 NOTE — Assessment & Plan Note (Signed)
Sliding scale insulin coverage 

## 2021-12-18 NOTE — Assessment & Plan Note (Signed)
-   Continue amlodipine, hydralazine and furosemide

## 2021-12-18 NOTE — Progress Notes (Signed)
This is a no charge noticed patient was admitted this AM.  Patient seen and examined.  H&P reviewed. Patient sitting on recliner chair.   Larry Callahan is a 66 y.o. male with medical history significant for CAD with moderate risk Myoview on 09/2020, CKD 3B, HTN, DM, COPD, OSA on CPAP, systolic heart failure, last EF 40-45% 07/2021, atrial flutter on Eliquis who presents to the ED with chest pain while at rest that started 12 hours prior to arrival.  He had no associated nausea, vomiting or diaphoresis.  Had no shortness of breath beyond his baseline. ED course and data review: Vitals within normal limits. Labs: Troponin 8, BNP 122.  Creatinine 2.73 which is his baseline.  WBC 6.9 and hemoglobin 10.8.  This a.m. reports chest pain with coughing or movement.  No shortness of breath.  Nad Cta  Regular s1/s2  Soft bening   A/P: Cardiology input was appreciated-atypical cp.  Cp likely muscular in nature. Started on lasix  No cardiac testing

## 2021-12-19 ENCOUNTER — Encounter: Payer: Self-pay | Admitting: Internal Medicine

## 2021-12-19 DIAGNOSIS — G4733 Obstructive sleep apnea (adult) (pediatric): Secondary | ICD-10-CM | POA: Diagnosis present

## 2021-12-19 DIAGNOSIS — Z833 Family history of diabetes mellitus: Secondary | ICD-10-CM | POA: Diagnosis not present

## 2021-12-19 DIAGNOSIS — I4891 Unspecified atrial fibrillation: Secondary | ICD-10-CM | POA: Diagnosis present

## 2021-12-19 DIAGNOSIS — Z87891 Personal history of nicotine dependence: Secondary | ICD-10-CM | POA: Diagnosis not present

## 2021-12-19 DIAGNOSIS — E890 Postprocedural hypothyroidism: Secondary | ICD-10-CM | POA: Diagnosis present

## 2021-12-19 DIAGNOSIS — I10 Essential (primary) hypertension: Secondary | ICD-10-CM

## 2021-12-19 DIAGNOSIS — E669 Obesity, unspecified: Secondary | ICD-10-CM | POA: Diagnosis present

## 2021-12-19 DIAGNOSIS — I2510 Atherosclerotic heart disease of native coronary artery without angina pectoris: Secondary | ICD-10-CM | POA: Diagnosis present

## 2021-12-19 DIAGNOSIS — N184 Chronic kidney disease, stage 4 (severe): Secondary | ICD-10-CM | POA: Diagnosis present

## 2021-12-19 DIAGNOSIS — I428 Other cardiomyopathies: Secondary | ICD-10-CM | POA: Diagnosis present

## 2021-12-19 DIAGNOSIS — I483 Typical atrial flutter: Secondary | ICD-10-CM | POA: Diagnosis not present

## 2021-12-19 DIAGNOSIS — Z8249 Family history of ischemic heart disease and other diseases of the circulatory system: Secondary | ICD-10-CM | POA: Diagnosis not present

## 2021-12-19 DIAGNOSIS — E785 Hyperlipidemia, unspecified: Secondary | ICD-10-CM | POA: Diagnosis present

## 2021-12-19 DIAGNOSIS — Z905 Acquired absence of kidney: Secondary | ICD-10-CM | POA: Diagnosis not present

## 2021-12-19 DIAGNOSIS — I4892 Unspecified atrial flutter: Secondary | ICD-10-CM | POA: Diagnosis present

## 2021-12-19 DIAGNOSIS — I890 Lymphedema, not elsewhere classified: Secondary | ICD-10-CM | POA: Diagnosis present

## 2021-12-19 DIAGNOSIS — Z20822 Contact with and (suspected) exposure to covid-19: Secondary | ICD-10-CM | POA: Diagnosis present

## 2021-12-19 DIAGNOSIS — R079 Chest pain, unspecified: Secondary | ICD-10-CM | POA: Diagnosis present

## 2021-12-19 DIAGNOSIS — Z79899 Other long term (current) drug therapy: Secondary | ICD-10-CM | POA: Diagnosis not present

## 2021-12-19 DIAGNOSIS — I5022 Chronic systolic (congestive) heart failure: Secondary | ICD-10-CM | POA: Diagnosis not present

## 2021-12-19 DIAGNOSIS — K219 Gastro-esophageal reflux disease without esophagitis: Secondary | ICD-10-CM | POA: Diagnosis present

## 2021-12-19 DIAGNOSIS — R072 Precordial pain: Secondary | ICD-10-CM | POA: Diagnosis not present

## 2021-12-19 DIAGNOSIS — Z880 Allergy status to penicillin: Secondary | ICD-10-CM | POA: Diagnosis not present

## 2021-12-19 DIAGNOSIS — Z7901 Long term (current) use of anticoagulants: Secondary | ICD-10-CM | POA: Diagnosis not present

## 2021-12-19 DIAGNOSIS — J449 Chronic obstructive pulmonary disease, unspecified: Secondary | ICD-10-CM | POA: Diagnosis present

## 2021-12-19 DIAGNOSIS — Z9049 Acquired absence of other specified parts of digestive tract: Secondary | ICD-10-CM | POA: Diagnosis not present

## 2021-12-19 DIAGNOSIS — I5043 Acute on chronic combined systolic (congestive) and diastolic (congestive) heart failure: Secondary | ICD-10-CM | POA: Diagnosis present

## 2021-12-19 DIAGNOSIS — I130 Hypertensive heart and chronic kidney disease with heart failure and stage 1 through stage 4 chronic kidney disease, or unspecified chronic kidney disease: Secondary | ICD-10-CM | POA: Diagnosis present

## 2021-12-19 DIAGNOSIS — E1122 Type 2 diabetes mellitus with diabetic chronic kidney disease: Secondary | ICD-10-CM | POA: Diagnosis present

## 2021-12-19 LAB — BASIC METABOLIC PANEL
Anion gap: 5 (ref 5–15)
BUN: 49 mg/dL — ABNORMAL HIGH (ref 8–23)
CO2: 21 mmol/L — ABNORMAL LOW (ref 22–32)
Calcium: 8.5 mg/dL — ABNORMAL LOW (ref 8.9–10.3)
Chloride: 113 mmol/L — ABNORMAL HIGH (ref 98–111)
Creatinine, Ser: 2.34 mg/dL — ABNORMAL HIGH (ref 0.61–1.24)
GFR, Estimated: 30 mL/min — ABNORMAL LOW (ref >60)
Glucose, Bld: 176 mg/dL — ABNORMAL HIGH (ref 70–99)
Potassium: 5.1 mmol/L (ref 3.5–5.1)
Sodium: 139 mmol/L (ref 135–145)

## 2021-12-19 LAB — GLUCOSE, CAPILLARY
Glucose-Capillary: 101 mg/dL — ABNORMAL HIGH (ref 70–99)
Glucose-Capillary: 243 mg/dL — ABNORMAL HIGH (ref 70–99)
Glucose-Capillary: 75 mg/dL (ref 70–99)

## 2021-12-19 MED ORDER — FUROSEMIDE 40 MG PO TABS
40.0000 mg | ORAL_TABLET | Freq: Every day | ORAL | Status: DC
  Administered 2021-12-20: 40 mg via ORAL
  Filled 2021-12-19: qty 1

## 2021-12-19 MED ORDER — ORAL CARE MOUTH RINSE: 15.0000 mL | OROMUCOSAL | Status: DC | PRN

## 2021-12-19 MED ORDER — INSULIN ASPART 100 UNIT/ML IJ SOLN
0.0000 [IU] | INTRAMUSCULAR | Status: DC
  Administered 2021-12-19: 5 [IU] via SUBCUTANEOUS
  Administered 2021-12-20: 3 [IU] via SUBCUTANEOUS
  Filled 2021-12-19 (×2): qty 1

## 2021-12-19 MED ORDER — POTASSIUM CHLORIDE CRYS ER 10 MEQ PO TBCR
10.0000 meq | EXTENDED_RELEASE_TABLET | Freq: Every day | ORAL | Status: DC
  Administered 2021-12-20: 10 meq via ORAL
  Filled 2021-12-19: qty 1

## 2021-12-19 MED ORDER — FUROSEMIDE 10 MG/ML IJ SOLN
20.0000 mg | Freq: Once | INTRAMUSCULAR | Status: AC
  Administered 2021-12-19: 20 mg via INTRAVENOUS
  Filled 2021-12-19: qty 4

## 2021-12-19 NOTE — Progress Notes (Signed)
PROGRESS NOTE    Larry Callahan  DTO:[id number] DOB: [birth year] DOA: 12/17/2021 PCP: Lavera Guise, MD    Brief Narrative:  Larry Callahan is a 66 y.o. male with medical history significant for CAD with moderate risk Myoview on 09/2020, CKD 3B, HTN, DM, COPD, OSA on CPAP, systolic heart failure, last EF 40-45% 07/2021, atrial flutter on Eliquis who presents to the ED with chest pain while at rest that started 12 hours prior to arrival.  He had no associated nausea, vomiting or diaphoresis.  Had no shortness of breath beyond his baseline. ED course and data review: Vitals within normal limits. Labs: Troponin 8, BNP 122.  Creatinine 2.73 which is his baseline.  WBC 6.9 and hemoglobin 10.8.  7/9 chest pain with movement.  Shortness of breath with exertion.  On room air 94% with walking per nursing  Consultants:  Cardiology  Procedures:   Antimicrobials:      Subjective: Short of breath while talking mildly  Objective: Vitals:   12/19/21 1000 12/19/21 1200 12/19/21 1204 12/19/21 1500  BP: 138/81   (!) 159/81  Pulse: 70 67 63 (!) 101  Resp: (!) '22 18 19 19  '$ Temp:    97.9 F (36.6 C)  TempSrc:    Oral  SpO2: 95% 97% 95% 98%  Weight:      Height:        Intake/Output Summary (Last 24 hours) at 12/19/2021 1517 Last data filed at 12/19/2021 1000 Gross per 24 hour  Intake --  Output 1225 ml  Net -1225 ml   Filed Weights   12/17/21 1857  Weight: 116 kg    Examination: Calm, NAD becomes mildly sob with conversation Scattered fine crackles b/l  Reg s1/s2 no gallop Soft benign +bs Trace pedal edema b/l Aaoxox3  Mood and affect appropriate in current setting     Data Reviewed: I have personally reviewed following labs and imaging studies  CBC: Recent Labs  Lab 12/17/21 1903  WBC 6.9  HGB 10.8*  HCT 34.0*  MCV 85.2  PLT 983   Basic Metabolic Panel: Recent Labs  Lab 12/16/21 1035 12/17/21 1903 12/18/21 0940 12/19/21 1200  NA 138 139 140 139   K 5.1 4.8 4.3 5.1  CL 111 113* 111 113*  CO2 20* 20* 21* 21*  GLUCOSE 91 122* 109* 176*  BUN 63* 64* 54* 49*  CREATININE 2.81* 2.73* 2.41* 2.34*  CALCIUM 9.0 8.7* 9.2 8.5*   GFR: Estimated Creatinine Clearance: 39.1 mL/min (A) (by C-G formula based on SCr of 2.34 mg/dL (H)). Liver Function Tests: No results for input(s): "AST", "ALT", "ALKPHOS", "BILITOT", "PROT", "ALBUMIN" in the last 168 hours. No results for input(s): "LIPASE", "AMYLASE" in the last 168 hours. No results for input(s): "AMMONIA" in the last 168 hours. Coagulation Profile: No results for input(s): "INR", "PROTIME" in the last 168 hours. Cardiac Enzymes: No results for input(s): "CKTOTAL", "CKMB", "CKMBINDEX", "TROPONINI" in the last 168 hours. BNP (last 3 results) No results for input(s): "PROBNP" in the last 8760 hours. HbA1C: No results for input(s): "HGBA1C" in the last 72 hours. CBG: No results for input(s): "GLUCAP" in the last 168 hours. Lipid Profile: No results for input(s): "CHOL", "HDL", "LDLCALC", "TRIG", "CHOLHDL", "LDLDIRECT" in the last 72 hours. Thyroid Function Tests: No results for input(s): "TSH", "T4TOTAL", "FREET4", "T3FREE", "THYROIDAB" in the last 72 hours. Anemia Panel: No results for input(s): "VITAMINB12", "FOLATE", "FERRITIN", "TIBC", "IRON", "RETICCTPCT" in the last 72 hours. Sepsis Labs: No results for input(s): "PROCALCITON", "  LATICACIDVEN" in the last 168 hours.  Recent Results (from the past 240 hour(s))  SARS Coronavirus 2 by RT PCR (hospital order, performed in [facility] hospital lab) *cepheid single result test* Anterior Nasal Swab     Status: None   Collection Time: 12/17/21 11:43 PM   Specimen: Anterior Nasal Swab  Result Value Ref Range Status   SARS Coronavirus 2 by RT PCR NEGATIVE NEGATIVE Final    Comment: (NOTE) SARS-CoV-2 target nucleic acids are NOT DETECTED.  The SARS-CoV-2 RNA is generally detectable in upper and lower respiratory specimens during the acute  phase of infection. The lowest concentration of SARS-CoV-2 viral copies this assay can detect is 250 copies / mL. A negative result does not preclude SARS-CoV-2 infection and should not be used as the sole basis for treatment or other patient management decisions.  A negative result may occur with improper specimen collection / handling, submission of specimen other than nasopharyngeal swab, presence of viral mutation(s) within the areas targeted by this assay, and inadequate number of viral copies (<250 copies / mL). A negative result must be combined with clinical observations, patient history, and epidemiological information.  Fact Sheet for Patients:   https://www.patel.info/  Fact Sheet for Healthcare Providers: https://hall.com/  This test is not yet approved or  cleared by the [place] FDA and has been authorized for detection and/or diagnosis of SARS-CoV-2 by FDA under an Emergency Use Authorization (EUA).  This EUA will remain in effect (meaning this test can be used) for the duration of the COVID-19 declaration under Section 564(b)(1) of the Act, 21 U.S.C. section 360bbb-3(b)(1), unless the authorization is terminated or revoked sooner.  Performed at [facility], [address] [place], [place] [address]          Radiology Studies: DG Chest 2 View  Result Date: 12/17/2021 CLINICAL DATA:  Shortness of breath.  Chest pain. EXAM: CHEST - 2 VIEW COMPARISON:  Chest x-ray September 23, 2021. FINDINGS: Patient rotation. Chronic enlargement the cardiac silhouette. Tortuous aorta. Mild streaky left basilar opacities. No confluent consolidation. No visible pleural effusions or pneumothorax. Degenerative changes of the thoracic spine. No acute osseous abnormality. IMPRESSION: 1. Mild streaky left basilar opacities, favor atelectasis. 2. Chronic cardiomegaly. Electronically Signed   By: Margaretha Sheffield M.D.   On: 12/17/2021  19:51        Scheduled Meds:  apixaban  5 mg Oral BID   carvedilol  12.5 mg Oral BID WC   [START ON 12/20/2021] furosemide  40 mg Oral Daily   gabapentin  100 mg Oral BID   hydrALAZINE  50 mg Oral TID   insulin aspart  0-15 Units Subcutaneous Q4H   lidocaine  1 patch Transdermal Q24H   [START ON 12/20/2021] potassium chloride  10 mEq Oral Daily   rosuvastatin  10 mg Oral QHS   Continuous Infusions:  Assessment & Plan:   Principal Problem:   Chest pain Active Problems:   Atrial flutter (HCC)   Chronic systolic CHF (congestive heart failure) (HCC)   COPD (chronic obstructive pulmonary disease) (HCC)   Essential hypertension   Diabetes mellitus type II, controlled ([place])   CKD (chronic kidney disease), stage IV ([place])   Atypical chest pain CAD Chest pain likely musculoskeletal Cardiology was consulted Troponin flat Nonobstructive by cath in 2011 Myoview in 2022 showed ischemia inf infarct v.s. artifact Per cards continue current mx   Atrial flutter ([place]) Continue carvedilol and Eliquis     Acute on Chronic systolic CHF (  congestive heart failure) ([place]) -Left ventricular ejection fraction, by estimation, is 40 to 45%, 07/2021 7/9 was mildly volume overloaded.  Doing better today, although still with DOE and sob with conversation Bnp was elevated Will give one time dose of lasix '20mg'$  iv x1 I/o Daily weight IS Monitor labs Start Lasix 40 mg tomorrow Continue KCl 20 mill equivalents Follow-up with cardiology as Continue current management    COPD (chronic obstructive pulmonary disease) ([place]) Not in acute exacerbation Continue inhalers     Essential hypertension Able Continue hydralazine, carvedilol, Lasix   CKD (chronic kidney disease), stage IV ([place]) - Renal function at baseline Monitor   Diabetes mellitus type II, controlled ([place]) Ck fs riss           DVT prophylaxis: Eliquis Code Status: Full Family Communication: None at  bedside Disposition Plan: Home Status is: Observation The patient remains OBS appropriate and will d/c before 2 midnights.        LOS: 0 days   Time spent: 41mn    SNolberto Hanlon MD [facility] Pager [phone]  If 7PM-7AM, please contact night-coverage 12/19/2021, 3:17 PM

## 2021-12-19 NOTE — Progress Notes (Addendum)
Progress Note  Patient Name: Larry Callahan Date of Encounter: 12/19/2021  [facility] Cardiologist: [name], MD   Subjective   Patient complains of some mild CP that is worse with breathing   Breathing overall is OK   Inpatient Medications    Scheduled Meds:  apixaban  5 mg Oral BID   carvedilol  12.5 mg Oral BID WC   furosemide  20 mg Oral Daily   gabapentin  100 mg Oral BID   hydrALAZINE  50 mg Oral TID   lidocaine  1 patch Transdermal Q24H   rosuvastatin  10 mg Oral QHS   Continuous Infusions:  PRN Meds: acetaminophen, ipratropium-albuterol, nitroGLYCERIN, ondansetron (ZOFRAN) IV   Vital Signs    Vitals:   12/19/21 0200 12/19/21 0400 12/19/21 0700 12/19/21 1000  BP: 128/69 (!) 129/58 (!) 152/73 138/81  Pulse: (!) 54 (!) 48 65 70  Resp: '18 20 14 '$ (!) 22  Temp:  98.4 F (36.9 C) 98.2 F (36.8 C)   TempSrc:  Oral Oral   SpO2: 97% 96% 98% 95%  Weight:      Height:        Intake/Output Summary (Last 24 hours) at 12/19/2021 1359 Last data filed at 12/19/2021 1000 Gross per 24 hour  Intake --  Output 1225 ml  Net -1225 ml      12/17/2021    6:57 PM 12/16/2021   10:00 AM 12/06/2021   11:37 AM  Last 3 Weights  Weight (lbs) 255 lb 11.7 oz 257 lb 6 oz 255 lb 11.7 oz  Weight (kg) 116 kg 116.745 kg 116 kg      Telemetry    Atrial flutter - Personally Reviewed  ECG    No new  - Personally Reviewed  Physical Exam  Pt in NAD   In Bed  GEN: No acute distress.   Neck: No JVD Cardiac: Irreg irreg  No S3   Respiratory: Clear to auscultation bilaterally. GI: Soft, nontender, non-distended  MS: No edema; No deformity. Neuro:  Nonfocal  Psych: Normal affect   Labs    High Sensitivity Troponin:   Recent Labs  Lab 12/17/21 1903 12/17/21 2343  TROPONINIHS 8 8     Chemistry Recent Labs  Lab 12/17/21 1903 12/18/21 0940 12/19/21 1200  NA 139 140 139  K 4.8 4.3 5.1  CL 113* 111 113*  CO2 20* 21* 21*  GLUCOSE 122* 109* 176*  BUN 64*  54* 49*  CREATININE 2.73* 2.41* 2.34*  CALCIUM 8.7* 9.2 8.5*  GFRNONAA 25* 29* 30*  ANIONGAP '6 8 5    '$ Lipids No results for input(s): "CHOL", "TRIG", "HDL", "LABVLDL", "LDLCALC", "CHOLHDL" in the last 168 hours.  Hematology Recent Labs  Lab 12/17/21 1903  WBC 6.9  RBC 3.99*  HGB 10.8*  HCT 34.0*  MCV 85.2  MCH 27.1  MCHC 31.8  RDW 14.9  PLT 186   Thyroid No results for input(s): "TSH", "FREET4" in the last 168 hours.  BNP Recent Labs  Lab 12/17/21 1903  BNP 122.8*    DDimer No results for input(s): "DDIMER" in the last 168 hours.   Radiology    DG Chest 2 View  Result Date: 12/17/2021 CLINICAL DATA:  Shortness of breath.  Chest pain. EXAM: CHEST - 2 VIEW COMPARISON:  Chest x-ray September 23, 2021. FINDINGS: Patient rotation. Chronic enlargement the cardiac silhouette. Tortuous aorta. Mild streaky left basilar opacities. No confluent consolidation. No visible pleural effusions or pneumothorax. Degenerative changes of the thoracic  spine. No acute osseous abnormality. IMPRESSION: 1. Mild streaky left basilar opacities, favor atelectasis. 2. Chronic cardiomegaly. Electronically Signed   By: Margaretha Sheffield M.D.   On: 12/17/2021 19:51    Cardiac Studies     Patient Profile     66 y.o. male   Assessment & Plan    1 Chest pain   I do not think pain is cardiac  Consistent with muscular or pleuritic     2  CAD   Hx of CAD   Nonobstructive by cath in 2011   Myoview in 2022 showed on ischemia  Inf inferct vs artifact Keep on same meds   3  chronic HFrEF    Looks better today   Keep on same meds as at home   Pt admitted to dietary noncompliance   I would keep on lasix   Switch to 40 mg daily   Keep on KCL 20      Will make sure he has follow up as an outpt Or see about someone coming out  4  HTN  BP is labile  Follow   5  Atrial flutter   Rate control and anticoagulation with Eliquis 5 bid   6  CkD   Cr rel stable at baseline  Cr 2.34      Will sign off Please call  wit question    For questions or updates, please contact [facility] Please consult [email or web address] for contact info under        Signed, Dorris Carnes, MD  12/19/2021, 1:59 PM

## 2021-12-20 DIAGNOSIS — I5022 Chronic systolic (congestive) heart failure: Secondary | ICD-10-CM | POA: Diagnosis not present

## 2021-12-20 DIAGNOSIS — I483 Typical atrial flutter: Secondary | ICD-10-CM | POA: Diagnosis not present

## 2021-12-20 DIAGNOSIS — R072 Precordial pain: Secondary | ICD-10-CM | POA: Diagnosis not present

## 2021-12-20 DIAGNOSIS — N184 Chronic kidney disease, stage 4 (severe): Secondary | ICD-10-CM | POA: Diagnosis not present

## 2021-12-20 LAB — BASIC METABOLIC PANEL
Anion gap: 4 — ABNORMAL LOW (ref 5–15)
BUN: 52 mg/dL — ABNORMAL HIGH (ref 8–23)
CO2: 24 mmol/L (ref 22–32)
Calcium: 8.5 mg/dL — ABNORMAL LOW (ref 8.9–10.3)
Chloride: 111 mmol/L (ref 98–111)
Creatinine, Ser: 2.55 mg/dL — ABNORMAL HIGH (ref 0.61–1.24)
GFR, Estimated: 27 mL/min — ABNORMAL LOW (ref >60)
Glucose, Bld: 140 mg/dL — ABNORMAL HIGH (ref 70–99)
Potassium: 4.8 mmol/L (ref 3.5–5.1)
Sodium: 139 mmol/L (ref 135–145)

## 2021-12-20 LAB — GLUCOSE, CAPILLARY
Glucose-Capillary: 127 mg/dL — ABNORMAL HIGH (ref 70–99)
Glucose-Capillary: 178 mg/dL — ABNORMAL HIGH (ref 70–99)
Glucose-Capillary: 159 mg/dL — ABNORMAL HIGH (ref 70–99)

## 2021-12-20 LAB — LIPOPROTEIN A (LPA): Lipoprotein (a): 166.9 nmol/L — ABNORMAL HIGH (ref <75.0)

## 2021-12-20 MED ORDER — FUROSEMIDE 40 MG PO TABS: 40.0000 mg | ORAL_TABLET | Freq: Every day | ORAL | 3 refills | Status: DC

## 2021-12-20 MED ORDER — POTASSIUM CHLORIDE CRYS ER 10 MEQ PO TBCR: 20.0000 meq | EXTENDED_RELEASE_TABLET | Freq: Every day | ORAL | Status: DC

## 2021-12-20 MED ORDER — APIXABAN 5 MG PO TABS: 5.0000 mg | ORAL_TABLET | Freq: Two times a day (BID) | ORAL | 0 refills | Status: DC

## 2021-12-20 MED ORDER — FUROSEMIDE 40 MG PO TABS: 40.0000 mg | ORAL_TABLET | Freq: Every day | ORAL | 0 refills | Status: DC

## 2021-12-20 NOTE — Discharge Summary (Signed)
Larry Callahan LZJ:[id number] DOB: [birth year] DOA: 12/17/2021  PCP: Lavera Guise, MD  Admit date: 12/17/2021 Discharge date: 12/20/2021  Admitted From: Home Disposition: Home  Recommendations for Outpatient Follow-up:  Follow up with PCP in 1 week Please obtain BMP/CBC in one week Please follow up with cardiology as scheduled  Home Health: Yes   Discharge Condition:Stable CODE STATUS: Full Diet recommendation: Heart Healthy / Carb Modified  Brief/Interim Summary: Per KWI:OXBDZ Larry Callahan is a 66 y.o. male with medical history significant for CAD with moderate risk Myoview on 09/2020, CKD 3B, HTN, DM, COPD, OSA on CPAP, systolic heart failure, last EF 40-45% 07/2021, atrial flutter on Eliquis who presents to the ED with chest pain while at rest that started 12 hours prior to arrival.  He had no associated nausea, vomiting or diaphoresis.  Had no shortness of breath beyond his baseline. ED course and data review: Vitals within normal limits. Labs: Troponin 8, BNP 122.  Creatinine 2.73 which is his baseline.  WBC 6.9 and hemoglobin 10.8.  Cardiology was consulted.  Patient was given IV Lasix.  They felt her chest pain was noncardiac and consistent with muscular skeletal.  No ischemic evaluation was warranted.  Once he was euvolemic he was switched to p.o. Lasix and discharged home.  Atypical chest pain CAD Chest pain likely musculoskeletal Cardiology was consulted Troponin flat Nonobstructive by cath in 2011 Myoview in 2022 showed ischemia inf infarct v.s. artifact No ischemic evaluation warranted   Atrial flutter (HCC) Continue carvedilol and Eliquis       Acute on Chronic systolic CHF (congestive heart failure) ([place]) -Left ventricular ejection fraction, by estimation, is 40 to 45%, 07/2021 Was treated with IV Lasix and switch to p.o. Follow-up with cardiology as scheduled Follow-up with heart failure clinic        COPD (chronic obstructive pulmonary disease)  ([place]) Not in acute exacerbation Continue inhalers       Essential hypertension Stable Continue home meds.   CKD (chronic kidney disease), stage IV (HCC) - Renal function at baseline Monitor   Diabetes mellitus type II, controlled ([place]) Continue home meds       Discharge Diagnoses:  Principal Problem:   Chest pain Active Problems:   Atrial flutter (HCC)   Chronic systolic CHF (congestive heart failure) (HCC)   COPD (chronic obstructive pulmonary disease) (HCC)   Essential hypertension   Diabetes mellitus type II, controlled ([place])   CKD (chronic kidney disease), stage IV [facility])    Discharge Instructions  Discharge Instructions     AMB referral to CHF clinic   Complete by: As directed    Diet - low sodium heart healthy   Complete by: As directed    Discharge instructions   Complete by: As directed    Take furosemide '40mg'$  daily. Take potassium 26mq daily.  Follow up with your heart doctor in one week   Increase activity slowly   Complete by: As directed       Allergies as of 12/20/2021       Reactions   Penicillins Rash        Medication List     STOP taking these medications    amLODipine 10 MG tablet Commonly known as: NORVASC   sacubitril-valsartan 24-26 MG Commonly known as: ENTRESTO       TAKE these medications    Accu-Chek Guide test strip Generic drug: glucose blood Use to check blood sugars 4 times a day   E11.65   Accu-Chek Softclix Lancets  lancets Use to check blood sugars 4 times a day   E11.65   apixaban 5 MG Tabs tablet Commonly known as: ELIQUIS Take 1 tablet (5 mg total) by mouth 2 (two) times daily. What changed:  medication strength See the new instructions.   Breztri Aerosphere 160-9-4.8 MCG/ACT Aero Generic drug: Budeson-Glycopyrrol-Formoterol Inhale into the lungs. Samples given.... 2 puffs in the morning and 2 puffs at bedtime   carvedilol 12.5 MG tablet Commonly known as: Coreg Take 1 tablet (12.5 mg  total) by mouth 2 (two) times daily.   cetirizine 10 MG tablet Commonly known as: ZYRTEC Take 10 mg by mouth daily as needed for allergies. Buys OTC   cyanocobalamin 1000 MCG tablet Take 1 tablet (1,000 mcg total) by mouth daily.   furosemide 40 MG tablet Commonly known as: LASIX Take 1 tablet (40 mg total) by mouth daily. Take lasix '40mg'$  po daily What changed:  how much to take how to take this when to take this additional instructions   furosemide 40 MG tablet Commonly known as: LASIX Take 1 tablet (40 mg total) by mouth daily. What changed: You were already taking a medication with the same name, and this prescription was added. Make sure you understand how and when to take each.   gabapentin 100 MG capsule Commonly known as: NEURONTIN Take 1 capsule (100 mg total) by mouth in the morning, at noon, and at bedtime.   glimepiride 2 MG tablet Commonly known as: AMARYL Take one tab po bid with meals   hydrALAZINE 50 MG tablet Commonly known as: APRESOLINE Take 1 tablet (50 mg total) by mouth 3 (three) times daily.   ipratropium-albuterol 0.5-2.5 (3) MG/3ML Soln Commonly known as: DUONEB Take 3 mLs by nebulization every 6 (six) hours as needed.   Novofine Pen Needle 32G X 6 MM Misc Generic drug: Insulin Pen Needle Use as directed   omeprazole 40 MG capsule Commonly known as: PRILOSEC Take 1 capsule once in the morning and once in the evening   potassium chloride 10 MEQ tablet Commonly known as: KLOR-CON M Take 2 tablets (20 mEq total) by mouth daily. What changed: medication strength   rosuvastatin 10 MG tablet Commonly known as: CRESTOR Take 1 tablet (10 mg total) by mouth at bedtime.   Toujeo Max SoloStar 300 UNIT/ML Solostar Pen Generic drug: insulin glargine (2 Unit Dial) Inject 20 units in am   Toujeo SoloStar 300 UNIT/ML Solostar Pen Generic drug: insulin glargine (1 Unit Dial) Inject into the skin.   True Metrix Air Glucose Meter Devi 1 Device by  Does not apply route 2 (two) times daily.        Follow-up Information     Gollan, Kathlene November, MD. Go in 1 week(s).   Specialty: Cardiology Why: Appointment on Friday 02/04/2022 at [place] with Ed Blalock. You are on a cancelation list, but please call often to get earlier appointment. Contact information: [address] [place] [address] [phone]         Lavera Guise, MD Follow up in 1 week(s).   Specialties: Internal Medicine, Cardiology Contact information: [place] [address] [phone]                Allergies  Allergen Reactions   Penicillins Rash    Consultations: cardiology   Procedures/Studies: DG Chest 2 View  Result Date: 12/17/2021 CLINICAL DATA:  Shortness of breath.  Chest pain. EXAM: CHEST - 2 VIEW COMPARISON:  Chest x-ray  September 23, 2021. FINDINGS: Patient rotation. Chronic enlargement the cardiac silhouette. Tortuous aorta. Mild streaky left basilar opacities. No confluent consolidation. No visible pleural effusions or pneumothorax. Degenerative changes of the thoracic spine. No acute osseous abnormality. IMPRESSION: 1. Mild streaky left basilar opacities, favor atelectasis. 2. Chronic cardiomegaly. Electronically Signed   By: Margaretha Sheffield M.D.   On: 12/17/2021 19:51   CT ORBITS WO CONTRAST  Result Date: 11/30/2021 CLINICAL DATA:  Asymptomatic right eye swelling. EXAM: CT ORBITS WITHOUT CONTRAST TECHNIQUE: Multidetector CT imaging of the orbits was performed using the standard protocol without intravenous contrast. Multiplanar CT image reconstructions were also generated. RADIATION DOSE REDUCTION: This exam was performed according to the departmental dose-optimization program which includes automated exposure control, adjustment of the mA and/or kV according to patient size and/or use of iterative reconstruction technique. COMPARISON:  None Available. FINDINGS: Orbits: No orbital mass is identified. Normal  appearance of the globes, optic nerve-sheath complexes, extraocular muscles, orbital fat and lacrimal glands. Visible paranasal sinuses: Clear. Soft tissues: There is moderate to marked severity right-sided preseptal and right supra orbital soft tissue swelling. Osseous: No fracture or aggressive lesion. Limited intracranial: No acute or significant finding. IMPRESSION: Moderate to marked severity right-sided preseptal and right supra orbital soft tissue swelling which may represent sequelae associated with preorbital cellulitis. Ophthalmology consultation is recommended. Electronically Signed   By: Virgina Norfolk M.D.   On: 11/30/2021 19:56      Subjective: No sop  or cp.   Discharge Exam: Vitals:   12/20/21 0809 12/20/21 1252  BP:  (!) 155/74  Pulse:  (!) 41  Resp:  18  Temp:  (!) 97.4 F (36.3 C)  SpO2: 98% 98%   Vitals:   12/20/21 0630 12/20/21 0733 12/20/21 0809 12/20/21 1252  BP:  (!) 160/89  (!) 155/74  Pulse:  70  (!) 41  Resp:  18  18  Temp:  97.8 F (36.6 C)  (!) 97.4 F (36.3 C)  TempSrc:      SpO2:  99% 98% 98%  Weight: 113 kg     Height:        General: Pt is alert, awake, not in acute distress Cardiovascular: RRR, S1/S2 +, no rubs, no gallops Respiratory: CTA bilaterally, no wheezing, no rhonchi Abdominal: Soft, NT, ND, bowel sounds + Extremities: no edema, no cyanosis    The results of significant diagnostics from this hospitalization (including imaging, microbiology, ancillary and laboratory) are listed below for reference.     Microbiology: Recent Results (from the past 240 hour(s))  SARS Coronavirus 2 by RT PCR (hospital order, performed in [facility] hospital lab) *cepheid single result test* Anterior Nasal Swab     Status: None   Collection Time: 12/17/21 11:43 PM   Specimen: Anterior Nasal Swab  Result Value Ref Range Status   SARS Coronavirus 2 by RT PCR NEGATIVE NEGATIVE Final    Comment: (NOTE) SARS-CoV-2 target nucleic acids are NOT  DETECTED.  The SARS-CoV-2 RNA is generally detectable in upper and lower respiratory specimens during the acute phase of infection. The lowest concentration of SARS-CoV-2 viral copies this assay can detect is 250 copies / mL. A negative result does not preclude SARS-CoV-2 infection and should not be used as the sole basis for treatment or other patient management decisions.  A negative result may occur with improper specimen collection / handling, submission of specimen other than nasopharyngeal swab, presence of viral mutation(s) within the areas targeted by this assay, and inadequate number of  viral copies (<250 copies / mL). A negative result must be combined with clinical observations, patient history, and epidemiological information.  Fact Sheet for Patients:   https://www.patel.info/  Fact Sheet for Healthcare Providers: https://hall.com/  This test is not yet approved or  cleared by the [place] FDA and has been authorized for detection and/or diagnosis of SARS-CoV-2 by FDA under an Emergency Use Authorization (EUA).  This EUA will remain in effect (meaning this test can be used) for the duration of the COVID-19 declaration under Section 564(b)(1) of the Act, 21 U.S.C. section 360bbb-3(b)(1), unless the authorization is terminated or revoked sooner.  Performed at [facility], [address] [name], [place] [address]      Labs: BNP (last 3 results) Recent Labs    09/23/21 1550 10/05/21 1402 12/17/21 1903  BNP 137.5* 221.4* 947.6*   Basic Metabolic Panel: Recent Labs  Lab 12/16/21 1035 12/17/21 1903 12/18/21 0940 12/19/21 1200 12/20/21 0241  NA 138 139 140 139 139  K 5.1 4.8 4.3 5.1 4.8  CL 111 113* 111 113* 111  CO2 20* 20* 21* 21* 24  GLUCOSE 91 122* 109* 176* 140*  BUN 63* 64* 54* 49* 52*  CREATININE 2.81* 2.73* 2.41* 2.34* 2.55*  CALCIUM 9.0 8.7* 9.2 8.5* 8.5*   Liver Function Tests: No  results for input(s): "AST", "ALT", "ALKPHOS", "BILITOT", "PROT", "ALBUMIN" in the last 168 hours. No results for input(s): "LIPASE", "AMYLASE" in the last 168 hours. No results for input(s): "AMMONIA" in the last 168 hours. CBC: Recent Labs  Lab 12/17/21 1903  WBC 6.9  HGB 10.8*  HCT 34.0*  MCV 85.2  PLT 186   Cardiac Enzymes: No results for input(s): "CKTOTAL", "CKMB", "CKMBINDEX", "TROPONINI" in the last 168 hours. BNP: Invalid input(s): "POCBNP" CBG: Recent Labs  Lab 12/19/21 2024 12/19/21 2349 12/20/21 0339 12/20/21 0734 12/20/21 1240  GLUCAP 243* 75 127* 178* 159*   D-Dimer No results for input(s): "DDIMER" in the last 72 hours. Hgb A1c No results for input(s): "HGBA1C" in the last 72 hours. Lipid Profile No results for input(s): "CHOL", "HDL", "LDLCALC", "TRIG", "CHOLHDL", "LDLDIRECT" in the last 72 hours. Thyroid function studies No results for input(s): "TSH", "T4TOTAL", "T3FREE", "THYROIDAB" in the last 72 hours.  Invalid input(s): "FREET3" Anemia work up No results for input(s): "VITAMINB12", "FOLATE", "FERRITIN", "TIBC", "IRON", "RETICCTPCT" in the last 72 hours. Urinalysis    Component Value Date/Time   COLORURINE YELLOW 06/28/2021 1120   APPEARANCEUR CLEAR (A) 06/28/2021 1120   APPEARANCEUR Clear 03/23/2021 1448   LABSPEC 1.020 06/28/2021 1120   LABSPEC 1.005 03/06/2013 0929   PHURINE 5.0 06/28/2021 1120   GLUCOSEU 500 (A) 06/28/2021 1120   GLUCOSEU Negative 03/06/2013 0929   HGBUR NEGATIVE 06/28/2021 1120   BILIRUBINUR NEGATIVE 06/28/2021 1120   BILIRUBINUR Negative 03/23/2021 1448   BILIRUBINUR Negative 03/06/2013 0929   KETONESUR NEGATIVE 06/28/2021 1120   PROTEINUR >300 (A) 06/28/2021 1120   UROBILINOGEN 0.2 02/20/2019 1404   NITRITE NEGATIVE 06/28/2021 1120   LEUKOCYTESUR NEGATIVE 06/28/2021 1120   LEUKOCYTESUR Negative 03/06/2013 0929   Sepsis Labs Recent Labs  Lab 12/17/21 1903  WBC 6.9   Microbiology Recent Results (from the  past 240 hour(s))  SARS Coronavirus 2 by RT PCR (hospital order, performed in [facility] hospital lab) *cepheid single result test* Anterior Nasal Swab     Status: None   Collection Time: 12/17/21 11:43 PM   Specimen: Anterior Nasal Swab  Result Value Ref Range Status   SARS Coronavirus  2 by RT PCR NEGATIVE NEGATIVE Final    Comment: (NOTE) SARS-CoV-2 target nucleic acids are NOT DETECTED.  The SARS-CoV-2 RNA is generally detectable in upper and lower respiratory specimens during the acute phase of infection. The lowest concentration of SARS-CoV-2 viral copies this assay can detect is 250 copies / mL. A negative result does not preclude SARS-CoV-2 infection and should not be used as the sole basis for treatment or other patient management decisions.  A negative result may occur with improper specimen collection / handling, submission of specimen other than nasopharyngeal swab, presence of viral mutation(s) within the areas targeted by this assay, and inadequate number of viral copies (<250 copies / mL). A negative result must be combined with clinical observations, patient history, and epidemiological information.  Fact Sheet for Patients:   https://www.patel.info/  Fact Sheet for Healthcare Providers: https://hall.com/  This test is not yet approved or  cleared by the [place] FDA and has been authorized for detection and/or diagnosis of SARS-CoV-2 by FDA under an Emergency Use Authorization (EUA).  This EUA will remain in effect (meaning this test can be used) for the duration of the COVID-19 declaration under Section 564(b)(1) of the Act, 21 U.S.C. section 360bbb-3(b)(1), unless the authorization is terminated or revoked sooner.  Performed at [facility], [address], [place], [place] [address]      Time coordinating discharge: Over 30 minutes  SIGNED:   Nolberto Hanlon, MD  [facility] 12/21/2021, 6:42  PM Pager   If 7PM-7AM, please contact night-coverage [email or web address] Password [email or web address]

## 2021-12-20 NOTE — TOC Initial Note (Signed)
Transition of Care [facility]) - Initial/Assessment Note    Patient Details  Name: Larry Callahan MRN: [id number] Date of Birth: Nov 06, 1955  Transition of Care [facility] CM/SW Contact:    Pete Pelt, RN Phone Number: 12/20/2021, 10:20 AM  Clinical Narrative:     Patient lives at home alone, has home health nurse from [facility] visit him.  Spoke to [place] at [employer], states that patient can return to their services.    Patient has walker at home, states he is able to care for himself, and even mows the grass at home.  Patient uses [employer] to transport to appointments, states home health nurse assists him with prescriptions, and he is able to take medications with the help of a pill box.  Patient states his sister can help on occasion, but she is 68 years of age and does not drive.  Patient will require a ride home, taxi contacted.  [facility] has also been working with patient, they have been notified of patient's discharge today.              Expected Discharge Plan: [place] Barriers to Discharge: Barriers Resolved   Patient Goals and CMS Choice        Expected Discharge Plan and Services Expected Discharge Plan: [place]   Discharge Planning Services: CM Consult Post Acute Care Choice: [place] arrangements for the past 2 months: Single Family Home                           HH Arranged: RN [place] Agency: [facility] Date [place]: 12/20/21 Time HH Agency Contacted: 64 Representative spoke with at [place]: Manuela Schwartz  Prior Living Arrangements/Services Living arrangements for the past 2 months: [place] with:: Self Patient language and need for interpreter reviewed:: Yes (no interpreter required) Do you feel safe going back to the place where you live?: Yes      Need for Family Participation in Patient Care: Yes (Comment) Care giver support system in place?: Yes (comment)  (sister and home health)   Criminal Activity/Legal Involvement Pertinent to Current Situation/Hospitalization: No - Comment as needed  Activities of Daily Living Home Assistive Devices/Equipment: Oxygen, Cane (specify quad or straight), CPAP ADL Screening (condition at time of admission) Patient's cognitive ability adequate to safely complete daily activities?: Yes Is the patient deaf or have difficulty hearing?: No Does the patient have difficulty seeing, even when wearing glasses/contacts?: No Does the patient have difficulty concentrating, remembering, or making decisions?: No Patient able to express need for assistance with ADLs?: Yes Does the patient have difficulty dressing or bathing?: No Independently performs ADLs?: Yes (appropriate for developmental age) Does the patient have difficulty walking or climbing stairs?: No Weakness of Legs: None Weakness of Arms/Hands: None  Permission Sought/Granted Permission sought to share information with : Case Manager Permission granted to share information with : Yes, Verbal Permission Granted     Permission granted to share info w AGENCY: [facility] home health        Emotional Assessment   Attitude/Demeanor/Rapport: Engaged Affect (typically observed): Pleasant, Appropriate Orientation: : Oriented to Self, Oriented to Place, Oriented to  Time, Oriented to Situation Alcohol / Substance Use: Not Applicable Psych Involvement: No (comment)  Admission diagnosis:  Chest pain [R07.9] Patient Active Problem List   Diagnosis Date Noted   Periorbital cellulitis of right eye 11/30/2021   Pleural  effusion 09/23/2021   Acute on chronic systolic CHF (congestive heart failure) ([place]) 08/05/2021   Acute CHF (congestive heart failure) ([place]) 08/05/2021   Acquired thrombophilia ([place]) 06/28/2021   Anemia in chronic kidney disease (CKD) 06/28/2021   Chest pain 06/28/2021   PAD (peripheral artery disease) (HCC)    Cellulitis of right arm 03/02/2021    Chronic anticoagulation 03/02/2021   Atrial flutter ([place]) 01/07/2021   Syncope and collapse 11/16/2020   Demand ischemia ([place])    Influenza A 10/30/2020   HLD (hyperlipidemia) 10/06/2020   History of CVA (cerebrovascular accident) 09/25/2020   Gastroesophageal reflux disease without esophagitis 04/19/2020   Pneumonia due to COVID-19 virus 01/30/2020   Elevated troponin I level 01/29/2020   Suspected COVID-19 virus infection 01/29/2020   Lightheadedness 01/29/2020   Hematemesis 01/09/2020   Hospital discharge follow-up 11/26/2019   Generalized weakness 08/08/2019   Obesity (BMI 30-39.9) 08/08/2019   Acute right hip pain 06/17/2019   Inability to ambulate due to hip 06/17/2019   CKD (chronic kidney disease), stage IV (HCC)    Hypervolemia    Chronic diastolic CHF (congestive heart failure) ([place])    Diabetes mellitus type II, controlled ([place])    Hypomagnesemia    Full code status 05/20/2019   Pleuritic chest pain    Multifocal pneumonia 05/19/2019   Acute kidney injury superimposed on CKD 3b ([place]) 05/09/2019   Hemoptysis 04/09/2019   Shortness of breath 03/26/2019   Uncontrolled type 2 diabetes mellitus with insulin therapy 03/12/2019   Acute on chronic heart failure with preserved ejection fraction (HFpEF) (HCC)    Chronic systolic CHF (congestive heart failure) ([place]) 40/01/6760   Diastolic dysfunction 95/02/3266   Iron deficiency anemia 12/31/2018   Atopic dermatitis 11/24/2018   Screening for colon cancer 11/06/2017   Uncontrolled type 2 diabetes mellitus with hyperglycemia ([place]) 11/06/2017   Hidradenitis 06/09/2017   Atherosclerotic heart disease of native coronary artery without angina pectoris 06/09/2017   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   Obstructive sleep apnea, adult 06/09/2017   Morbid (severe) obesity due to excess calories ([place]) 06/09/2017   Cigarette nicotine dependence with nicotine-induced disorder 06/09/2017   Diabetic  polyneuropathy associated with type 2 diabetes mellitus ([place]) 06/09/2017   Allergic rhinitis due to pollen 06/09/2017   Mixed hyperlipidemia 06/09/2017   COPD (chronic obstructive pulmonary disease) ([place]) 06/09/2017   Dyspnea on exertion 06/09/2017   Wheezing 06/09/2017   Dysuria 06/09/2017   Snoring 06/09/2017   Essential hypertension 06/09/2017   Personal history of other malignant neoplasm of kidney 06/09/2017   Pain in right hip 06/09/2017   Impacted cerumen, bilateral 06/09/2017   Secondary hyperparathyroidism, not elsewhere classified ([place]) 06/09/2017   Tinea corporis 06/09/2017   PCP:  Lavera Guise, MD Pharmacy:   [facility] [address] [place] [address] Phone: [phone] Fax: [phone]     Social Determinants of Health (SDOH) Interventions    Readmission Risk Interventions    12/01/2021   12:25 PM 08/07/2021    2:36 PM 06/28/2021    2:26 PM  Readmission Risk Prevention Plan  Transportation Screening Complete Complete Complete  Medication Review Press photographer) Complete Complete Complete  PCP or Specialist appointment within 3-5 days of discharge Complete Complete Complete  HRI or Home Care Consult Complete Complete Complete  SW Recovery Care/Counseling Consult Complete Complete Complete  Palliative Care Screening Complete Complete Not [place]  Complete Complete Not Applicable

## 2021-12-20 NOTE — Consult Note (Signed)
   [facility] Inpatient Consult   12/20/2021  Brock 1955-12-20 [id number]  [place] [facility] [ACO] Patient: [employer]  *Remote review: patient readmitted to [facility] with less than 30 days, with patient's 6th admission in the past 6 months, extreme high risk  Primary Care Provider:  Lavera Guise, MD, [facility]  Patient evaluated for community based chronic complex disease management services with [facility] as a benefit of patient's [employer]. Spoke with inpatient [facility] RNCM regarding home health shows active with [facility] in Patient [place], as well. Review noted that [facility] has had difficulty gaining/maintaining contact with patient.   Plan:  Patient has been assigned for post hospital follow up with a [place], currently is pending, will alert [facility] RNCM of inpatient [facility] RNCM and for ongoing follow up needs.  Call attempts without success.         Of note, [facility] Care Management services does not replace or interfere with any services that are arranged by inpatient case management or social work.  For additional questions or referrals please contact:    Natividad Brood, RN BSN [facility] Liaison  [phone] business mobile phone Toll free office [phone]  Fax number: [phone] [email or web address]  [email or web address]

## 2021-12-21 ENCOUNTER — Other Ambulatory Visit: Payer: Self-pay

## 2021-12-21 NOTE — Patient Outreach (Signed)
[facility]) Care Management  12/21/2021  Larry Callahan 01-Aug-1955 [id number]   Transition of Care Referral   Referral Date: 12/06/2021 Referral [facility] Liaison Date of Discharge: 12/04/2021 Facility: [facility]   Unsuccessful outreach attempt to patient and emergency contact.      Plan: RN CM will make final attempt to patient within 4 business days.   Enzo Montgomery, RN,BSN,CCM [place] Telephonic Care Management Coordinator Direct Phone: [phone] Toll Free: [phone] Fax: [phone]

## 2021-12-22 ENCOUNTER — Other Ambulatory Visit: Payer: Self-pay

## 2021-12-22 ENCOUNTER — Telehealth: Payer: Self-pay

## 2021-12-22 NOTE — Patient Outreach (Signed)
[facility]) Care Management  12/22/2021  Larry Callahan [birth year] [id number]   Transition of Care Referral   Referral Date: 12/06/2021 Referral [facility] Liaison Date of Discharge: 12/04/2021 Facility: [facility]    Unsuccessful outreach attempt to patient.     Plan: RN CM will close case if no response from patient by 01/03/22.  Enzo Montgomery, RN,BSN,CCM [place] [facility] Telephonic Care Management Coordinator Direct Phone: [phone] Toll Free: [phone] Fax: [phone]

## 2021-12-22 NOTE — Telephone Encounter (Signed)
Lvm to schedule hospital follow up-toni

## 2022-01-03 ENCOUNTER — Other Ambulatory Visit: Payer: Self-pay

## 2022-01-03 ENCOUNTER — Telehealth: Payer: Self-pay

## 2022-01-03 ENCOUNTER — Emergency Department: Payer: Medicare PPO

## 2022-01-03 ENCOUNTER — Encounter: Payer: Self-pay | Admitting: Emergency Medicine

## 2022-01-03 ENCOUNTER — Observation Stay
Admission: EM | Admit: 2022-01-03 | Discharge: 2022-01-04 | Disposition: A | Discharge status: Home / Self Care | Payer: Medicare PPO | Attending: Internal Medicine | Admitting: Internal Medicine

## 2022-01-03 ENCOUNTER — Ambulatory Visit: Payer: Self-pay

## 2022-01-03 DIAGNOSIS — Z794 Long term (current) use of insulin: Secondary | ICD-10-CM | POA: Diagnosis not present

## 2022-01-03 DIAGNOSIS — J449 Chronic obstructive pulmonary disease, unspecified: Secondary | ICD-10-CM | POA: Diagnosis not present

## 2022-01-03 DIAGNOSIS — I130 Hypertensive heart and chronic kidney disease with heart failure and stage 1 through stage 4 chronic kidney disease, or unspecified chronic kidney disease: Secondary | ICD-10-CM | POA: Diagnosis not present

## 2022-01-03 DIAGNOSIS — I5042 Chronic combined systolic (congestive) and diastolic (congestive) heart failure: Secondary | ICD-10-CM | POA: Insufficient documentation

## 2022-01-03 DIAGNOSIS — I4891 Unspecified atrial fibrillation: Secondary | ICD-10-CM | POA: Diagnosis not present

## 2022-01-03 DIAGNOSIS — E669 Obesity, unspecified: Secondary | ICD-10-CM | POA: Diagnosis present

## 2022-01-03 DIAGNOSIS — I4892 Unspecified atrial flutter: Secondary | ICD-10-CM | POA: Insufficient documentation

## 2022-01-03 DIAGNOSIS — Z79899 Other long term (current) drug therapy: Secondary | ICD-10-CM | POA: Diagnosis not present

## 2022-01-03 DIAGNOSIS — N281 Cyst of kidney, acquired: Secondary | ICD-10-CM | POA: Diagnosis not present

## 2022-01-03 DIAGNOSIS — F17219 Nicotine dependence, cigarettes, with unspecified nicotine-induced disorders: Secondary | ICD-10-CM | POA: Diagnosis present

## 2022-01-03 DIAGNOSIS — E119 Type 2 diabetes mellitus without complications: Secondary | ICD-10-CM | POA: Diagnosis present

## 2022-01-03 DIAGNOSIS — K429 Umbilical hernia without obstruction or gangrene: Secondary | ICD-10-CM | POA: Diagnosis not present

## 2022-01-03 DIAGNOSIS — D509 Iron deficiency anemia, unspecified: Secondary | ICD-10-CM | POA: Diagnosis not present

## 2022-01-03 DIAGNOSIS — I2510 Atherosclerotic heart disease of native coronary artery without angina pectoris: Secondary | ICD-10-CM | POA: Insufficient documentation

## 2022-01-03 DIAGNOSIS — Z87891 Personal history of nicotine dependence: Secondary | ICD-10-CM | POA: Diagnosis not present

## 2022-01-03 DIAGNOSIS — R42 Dizziness and giddiness: Principal | ICD-10-CM | POA: Insufficient documentation

## 2022-01-03 DIAGNOSIS — R1033 Periumbilical pain: Secondary | ICD-10-CM

## 2022-01-03 DIAGNOSIS — K469 Unspecified abdominal hernia without obstruction or gangrene: Secondary | ICD-10-CM | POA: Diagnosis not present

## 2022-01-03 DIAGNOSIS — K5730 Diverticulosis of large intestine without perforation or abscess without bleeding: Secondary | ICD-10-CM | POA: Diagnosis not present

## 2022-01-03 DIAGNOSIS — K219 Gastro-esophageal reflux disease without esophagitis: Secondary | ICD-10-CM | POA: Diagnosis present

## 2022-01-03 DIAGNOSIS — Z8673 Personal history of transient ischemic attack (TIA), and cerebral infarction without residual deficits: Secondary | ICD-10-CM | POA: Diagnosis not present

## 2022-01-03 DIAGNOSIS — E1165 Type 2 diabetes mellitus with hyperglycemia: Secondary | ICD-10-CM | POA: Insufficient documentation

## 2022-01-03 DIAGNOSIS — G4733 Obstructive sleep apnea (adult) (pediatric): Secondary | ICD-10-CM | POA: Diagnosis present

## 2022-01-03 DIAGNOSIS — Z8616 Personal history of COVID-19: Secondary | ICD-10-CM | POA: Diagnosis not present

## 2022-01-03 DIAGNOSIS — Z7901 Long term (current) use of anticoagulants: Secondary | ICD-10-CM | POA: Diagnosis not present

## 2022-01-03 DIAGNOSIS — Z8679 Personal history of other diseases of the circulatory system: Secondary | ICD-10-CM | POA: Diagnosis not present

## 2022-01-03 DIAGNOSIS — I5022 Chronic systolic (congestive) heart failure: Secondary | ICD-10-CM | POA: Diagnosis present

## 2022-01-03 DIAGNOSIS — N184 Chronic kidney disease, stage 4 (severe): Secondary | ICD-10-CM | POA: Diagnosis not present

## 2022-01-03 DIAGNOSIS — I10 Essential (primary) hypertension: Secondary | ICD-10-CM | POA: Diagnosis present

## 2022-01-03 DIAGNOSIS — R109 Unspecified abdominal pain: Secondary | ICD-10-CM | POA: Diagnosis present

## 2022-01-03 DIAGNOSIS — R52 Pain, unspecified: Secondary | ICD-10-CM | POA: Diagnosis not present

## 2022-01-03 LAB — URINALYSIS, ROUTINE W REFLEX MICROSCOPIC
Bilirubin Urine: NEGATIVE
Glucose, UA: NEGATIVE mg/dL
Ketones, ur: NEGATIVE mg/dL
Leukocytes,Ua: NEGATIVE
Nitrite: NEGATIVE
Protein, ur: >=300 mg/dL — AB
Specific Gravity, Urine: 1.008 (ref 1.005–1.030)
Squamous Epithelial / HPF: NONE SEEN (ref 0–5)
pH: 6 (ref 5.0–8.0)

## 2022-01-03 LAB — LIPASE, BLOOD: Lipase: 39 U/L (ref 11–51)

## 2022-01-03 LAB — COMPREHENSIVE METABOLIC PANEL
ALT: 18 U/L (ref 0–44)
AST: 20 U/L (ref 15–41)
Albumin: 3.3 g/dL — ABNORMAL LOW (ref 3.5–5.0)
Alkaline Phosphatase: 82 U/L (ref 38–126)
Anion gap: 6 (ref 5–15)
BUN: 38 mg/dL — ABNORMAL HIGH (ref 8–23)
CO2: 22 mmol/L (ref 22–32)
Calcium: 8.8 mg/dL — ABNORMAL LOW (ref 8.9–10.3)
Chloride: 112 mmol/L — ABNORMAL HIGH (ref 98–111)
Creatinine, Ser: 2.3 mg/dL — ABNORMAL HIGH (ref 0.61–1.24)
GFR, Estimated: 30 mL/min — ABNORMAL LOW (ref >60)
Glucose, Bld: 107 mg/dL — ABNORMAL HIGH (ref 70–99)
Potassium: 4.3 mmol/L (ref 3.5–5.1)
Sodium: 140 mmol/L (ref 135–145)
Total Bilirubin: 0.7 mg/dL (ref 0.3–1.2)
Total Protein: 7.4 g/dL (ref 6.5–8.1)

## 2022-01-03 LAB — CBC
HCT: 35.5 % — ABNORMAL LOW (ref 39.0–52.0)
Hemoglobin: 11.3 g/dL — ABNORMAL LOW (ref 13.0–17.0)
MCH: 27.3 pg (ref 26.0–34.0)
MCHC: 31.8 g/dL (ref 30.0–36.0)
MCV: 85.7 fL (ref 80.0–100.0)
Platelets: 193 10*3/uL (ref 150–400)
RBC: 4.14 MIL/uL — ABNORMAL LOW (ref 4.22–5.81)
RDW: 14.6 % (ref 11.5–15.5)
WBC: 6.1 10*3/uL (ref 4.0–10.5)
nRBC: 0 % (ref 0.0–0.2)

## 2022-01-03 LAB — LACTIC ACID, PLASMA: Lactic Acid, Venous: 0.7 mmol/L (ref 0.5–1.9)

## 2022-01-03 MED ORDER — MECLIZINE HCL 25 MG PO TABS
25.0000 mg | ORAL_TABLET | Freq: Once | ORAL | Status: AC
  Administered 2022-01-04: 25 mg via ORAL
  Filled 2022-01-03: qty 1

## 2022-01-03 MED ORDER — ONDANSETRON HCL 4 MG/2ML IJ SOLN
4.0000 mg | Freq: Once | INTRAMUSCULAR | Status: AC
  Administered 2022-01-03: 4 mg via INTRAVENOUS
  Filled 2022-01-03: qty 2

## 2022-01-03 MED ORDER — SODIUM CHLORIDE 0.9 % IV BOLUS
500.0000 mL | Freq: Once | INTRAVENOUS | Status: AC
  Administered 2022-01-04: 500 mL via INTRAVENOUS

## 2022-01-03 MED ORDER — HYDROMORPHONE HCL 1 MG/ML IJ SOLN
0.5000 mg | Freq: Once | INTRAMUSCULAR | Status: DC
  Filled 2022-01-03: qty 0.5

## 2022-01-03 MED ORDER — MORPHINE SULFATE (PF) 4 MG/ML IV SOLN
4.0000 mg | Freq: Once | INTRAVENOUS | Status: AC
  Administered 2022-01-03: 4 mg via INTRAVENOUS
  Filled 2022-01-03: qty 1

## 2022-01-03 MED ORDER — PANTOPRAZOLE SODIUM 40 MG IV SOLR
40.0000 mg | Freq: Two times a day (BID) | INTRAVENOUS | Status: DC
  Administered 2022-01-04 (×2): 40 mg via INTRAVENOUS
  Filled 2022-01-03 (×2): qty 10

## 2022-01-03 NOTE — Patient Outreach (Signed)
[facility]) Care Management  01/03/2022  Rome Echavarria Chieffo 21-Sep-1955 [id number]   Transition of Care Referral   Referral Date: 12/06/2021 Referral [facility] Liaison Date of Discharge: 12/04/2021 Facility: [facility]   Multiple attempts to establish contact with patient without success. No response from letter mailed to patient. Case is being closed at this time.    Plan: RN CM will close case.   Enzo Montgomery, RN,BSN,CCM [place] [facility] Telephonic Care Management Coordinator Direct Phone: [phone] Toll Free: [phone] Fax: [phone]

## 2022-01-03 NOTE — ED Provider Notes (Signed)
[facility] Provider Note    Event Date/Time   First MD Initiated Contact with Patient 01/03/22 1518     (approximate)   History   Abdominal Pain   HPI  Larry Callahan is a 66 y.o. male with a history of CAD, CKD, hypertension, diabetes, COPD, OSA on CPAP, CHF, and atrial flutter on Eliquis who presents with an umbilical hernia which has been present for months, but acutely painful since last night.  The pain is constant.  He describes as a sharp stabbing pain straight through his umbilicus.  He has no associated nausea, vomiting, or change in bowel movements.  He states he has passing gas normally.    Physical Exam   Triage Vital Signs: ED Triage Vitals  Enc Vitals Group     BP 01/03/22 1216 (!) 162/91     Pulse Rate 01/03/22 1216 72     Resp 01/03/22 1216 18     Temp 01/03/22 1216 98.1 F (36.7 C)     Temp Source 01/03/22 1216 Oral     SpO2 01/03/22 1216 98 %     Weight 01/03/22 1217 250 lb (113.4 kg)     Height 01/03/22 1217 5\' 11"  (1.803 m)     Head Circumference --      Peak Flow --      Pain Score 01/03/22 1217 8     Pain Loc --      Pain Edu? --      Excl. in GC? --     Most recent vital signs: Vitals:   01/03/22 1935 01/03/22 2159  BP: (!) 179/104 (!) 174/100  Pulse: 76 69  Resp: 19 18  Temp: 98.1 F (36.7 C) 97.7 F (36.5 C)  SpO2: 95% 95%     General: Awake, no distress.  CV:  Good peripheral perfusion.  Resp:  Normal effort.  Abd:  Soft and nontender except for palpable umbilical swelling and tenderness around the umbilicus.  No distention.  Other:  No jaundice or scleral icterus.  Moist mucous membranes.   ED Results / Procedures / Treatments   Labs (all labs ordered are listed, but only abnormal results are displayed) Labs Reviewed  CBC - Abnormal; Notable for the following components:      Result Value   RBC 4.14 (*)    Hemoglobin 11.3 (*)    HCT 35.5 (*)    All other components within normal limits   COMPREHENSIVE METABOLIC PANEL - Abnormal; Notable for the following components:   Chloride 112 (*)    Glucose, Bld 107 (*)    BUN 38 (*)    Creatinine, Ser 2.30 (*)    Calcium 8.8 (*)    Albumin 3.3 (*)    GFR, Estimated 30 (*)    All other components within normal limits  URINALYSIS, ROUTINE W REFLEX MICROSCOPIC - Abnormal; Notable for the following components:   Color, Urine YELLOW (*)    APPearance CLEAR (*)    Hgb urine dipstick SMALL (*)    Protein, ur >=300 (*)    Bacteria, UA RARE (*)    All other components within normal limits  LIPASE, BLOOD  LACTIC ACID, PLASMA  CK  C-REACTIVE PROTEIN  COMPREHENSIVE METABOLIC PANEL  CBC     EKG     RADIOLOGY  CT abdomen/pelvis: I independently viewed and interpreted the images; fat-containing umbilical hernia with no other acute abnormality  PROCEDURES:  Critical Care performed: No  Procedures   MEDICATIONS  ORDERED IN ED: Medications  HYDROmorphone (DILAUDID) injection 0.5 mg (0.5 mg Intravenous Patient Refused/Not Given 01/03/22 1802)  meclizine (ANTIVERT) tablet 25 mg (has no administration in time range)  sodium chloride 0.9 % bolus 500 mL (has no administration in time range)  pantoprazole (PROTONIX) injection 40 mg (has no administration in time range)  apixaban (ELIQUIS) tablet 5 mg (has no administration in time range)  carvedilol (COREG) tablet 12.5 mg (has no administration in time range)  ipratropium-albuterol (DUONEB) 0.5-2.5 (3) MG/3ML nebulizer solution 3 mL (has no administration in time range)  rosuvastatin (CRESTOR) tablet 10 mg (has no administration in time range)  hydrALAZINE (APRESOLINE) tablet 50 mg (has no administration in time range)  vitamin B-12 (CYANOCOBALAMIN) tablet 1,000 mcg (has no administration in time range)  pantoprazole (PROTONIX) EC tablet 40 mg (has no administration in time range)  sodium chloride flush (NS) 0.9 % injection 3 mL (has no administration in time range)   acetaminophen (TYLENOL) tablet 650 mg (has no administration in time range)    Or  acetaminophen (TYLENOL) suppository 650 mg (has no administration in time range)  sodium chloride flush (NS) 0.9 % injection 3 mL (has no administration in time range)  sodium chloride flush (NS) 0.9 % injection 3 mL (has no administration in time range)  0.9 %  sodium chloride infusion (has no administration in time range)  insulin aspart (novoLOG) injection 0-15 Units (has no administration in time range)  morphine (PF) 4 MG/ML injection 4 mg (4 mg Intravenous Given 01/03/22 1554)  ondansetron (ZOFRAN) injection 4 mg (4 mg Intravenous Given 01/03/22 1553)     IMPRESSION / MDM / ASSESSMENT AND PLAN / ED COURSE  I reviewed the triage vital signs and the nursing notes.  66 year old male with PMH as noted above presents with acute pain since last night to an umbilical hernia that has been present for some time.  He denies any other acute symptoms.  I reviewed the past medical records.  Per the hospitalist discharge summary from 7/10, the patient was admitted earlier this month with chest pain and cardiac work-up.  On exam the patient has localized tenderness around the umbilicus and an apparent umbilical hernia which is not readily reducible.  Differential diagnosis includes, but is not limited to, umbilical hernia possibly with incarceration or strangulation, diverticulitis, colitis, bowel obstruction, volvulus, gastritis, gastroenteritis.  Patient's presentation is most consistent with acute presentation with potential threat to life or bodily function.  Initial labs reveal no leukocytosis, and elevated creatinine consistent with the patient's baseline.  Lipase is normal.  LFTs are normal.  We will obtain a CT and lactate for further evaluation.  The patient is on the cardiac monitor to evaluate for evidence of arrhythmia and/or significant heart rate  changes.  ----------------------------------------- 11:04 PM on 01/03/2022 -----------------------------------------   CT showed a fat-containing umbilical hernia with no other acute findings.  There is a right renal mass which I counseled the patient on and he stated that he already knew about.  I do see that it was documented on prior imaging.  I discussed the hernia with Dr. Maia Plan from general surgery who advised that the patient will be appropriate for outpatient follow-up.  The patient was observed all day as he stated that after the morphine he continued getting dizzy whenever he tried to get up, and at the same time was still having pain.  The vital signs have remained stable.  The patient has been comfortable appearing throughout his ED stay  although does complain of continued abdominal pain.  However whenever he tries to stand up he feels dizzy which she describes more as a sensation of needing to hold onto something rather than feeling like he is going to pass out.  I did order dose of meclizine and some fluids.  However given this finding, the patient is not stable for discharge home at this time so I consulted the hospitalist for admission; based on our discussion they agreed to admit the patient.   FINAL CLINICAL IMPRESSION(S) / ED DIAGNOSES   Final diagnoses:  Dizziness  Periumbilical abdominal pain  Umbilical hernia without obstruction and without gangrene     Rx / DC Orders   ED Discharge Orders     None        Note:  This document was prepared using Dragon voice recognition software and may include unintentional dictation errors.    Dionne Bucy, MD 01/04/22 [phone]

## 2022-01-03 NOTE — H&P (Signed)
History and Physical    Larry Callahan QPY:[id number] DOB: 1956-03-10 DOA: 01/03/2022  PCP: Lavera Guise, MD    Patient coming from:  Home   Chief Complaint:  Abdominal pain   HPI:  Larry Callahan is a 66 y.o. male seen in ed with complaints of abdominal pain   Abd pain from hernia, umbilical hernia non acute. He got morphine and since then was persistently dizzy and vertigo like symptoms. No focal neuro and dehydrated and pain meds suspected.  Vitals ok. Neuro ok. Ct abd is ok. Pt is mobile at baseline.    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    ED Course:   Vitals:   01/03/22 1544 01/03/22 1800 01/03/22 1935 01/03/22 2159  BP: (!) 190/93 (!) 157/102 (!) 179/104 (!) 174/100  Pulse: 71 71 76 69  Resp: _0 Temp:   98.1 F (36.7 C) 97.7 F (36.5 C)  TempSrc:    Oral  SpO2: 98% 96% 95% 95%  Weight:      Height:      In the emergency room patient is alert awake oriented afebrile O2 sats of 95% on room air. Per ED provider as patient was being evaluated in the emergency room plan was for discharge however patient was noted to be dizzy when he stood up, An off-balance every time patient was gotten up and ambulated.  At baseline patient has had to be ambulatory. CMP shows creatinine of 2.30 which is improved from 2.5 earlier this month with a GFR of 30 normal electrolytes and LFTs otherwise. CBC shows white count of 6.1 hemoglobin of 11.3 platelets of 193. Urinalysis today shows clear yellow urine with proteinuria of more than 300 rare bacteria 0-5 WBCs and 0-5 RBCs. -Last colonoscopy was in October 2020 done by  Dr. Marius Ditch showing:- One 5 mm polyp in the sigmoid colon, removed with a cold snare. >Last endoscopy was done in June 2021 by Dr. Marius Ditch showing:- Normal duodenal bulb and second portion of the duodenum. - Normal stomach. - Z-line irregular. - Normal esophagus. - No specimens collected. Impression: - Return patient to hospital ward for possible discharge same day. > Last MRI in June 2022 showed MRI appearance of a stable brain but bilateral mastoid effusions that have developed since previous study and Of postinflammatory such as from upper upper respiratory or otitis media. > CTA in June 2022: IMPRESSION: 1. Suboptimal due to contrast bolus timing and artifact. 2. Cervical ICAs and vertebral arteries are patent to the skull base. - tortuous cervical ICAs with no stenosis identified. - dominant right vertebral artery, with no obvious vertebral artery stenosis.  3. Consider a routine follow-up CTA of the Neck to achieve better vessel detail. Electronically Signed   By: Genevie Ann M.D.   On: 11/25/2020 04:35        Review of Systems:  Review of Systems  Constitutional:  Positive for malaise/fatigue.  Neurological:  Positive for dizziness.      Past Medical History:  Diagnosis Date   Allergy    Atrial flutter ([place])    a. Dx 12/2020--CHA2DS2VASc = 5-->eliquis.   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 04/2019 Echo: EF 45-50%; b. 02/2019 Echo: EF 55-60%; c. 09/2020 Echo: EF 35-40%, glob HK. Nl RV size/fxn. Mild MR; d. 07/2021 Echo: EF 40-45%, mild LVH, low-nl RV fxn, no MR.   CKD (chronic kidney disease), stage III (HCC)    COPD (chronic obstructive pulmonary disease) ([place])    Coronary artery disease    a. 2011 Cath: nonobs dzs; b. 09/2020 MV: EF 38%. No signif ischemia. ? inf MI vs diaph attenuation. GI uptake artifact.   Diabetes mellitus without complication ([place])    GERD (gastroesophageal reflux disease)    Hyperlipidemia     Hypertension    Morbid obesity ([place])    NICM (nonischemic cardiomyopathy) ([place])    a. 04/2019 Echo: EF 45-50%; b. 02/2019 Echo: EF 55-60%; c. 09/2020 Echo: EF 35-40%, glob HK; d. 09/2020 MV: No ischemia. ? inf infarct vs attenuation; d. 07/2021 Echo: EF 40-45%.   OSA (obstructive sleep apnea)    Stage 3b chronic kidney disease ([place])     Past Surgical History:  Procedure Laterality Date   BACK SURGERY     CARDIAC CATHETERIZATION     CHOLECYSTECTOMY     COLONOSCOPY WITH PROPOFOL N/A 04/12/2019   Procedure: COLONOSCOPY WITH PROPOFOL;  Surgeon: [name], MD;  Location[facility] ENDOSCOPY;  Service: Gastroenterology;  Laterality: N/A;   ESOPHAGOGASTRODUODENOSCOPY N/A 04/12/2019   Procedure: ESOPHAGOGASTRODUODENOSCOPY (EGD);  Surgeon: [name], MD;  Location: [facility] ENDOSCOPY;  Service: Gastroenterology;  Laterality: N/A;   ESOPHAGOGASTRODUODENOSCOPY (EGD) WITH PROPOFOL N/A 11/21/2019   Procedure: ESOPHAGOGASTRODUODENOSCOPY (EGD) WITH PROPOFOL;  Surgeon: [name], MD;  Location: [facility] ENDOSCOPY;  Service: Gastroenterology;  Laterality: N/A;   FLEXIBLE BRONCHOSCOPY Bilateral 05/17/2019   Procedure: FLEXIBLE BRONCHOSCOPY;  Surgeon: [name], MD;  Location: [name] ORS;  Service: Pulmonary;  Laterality: Bilateral;   left arm surgery     nephrectomy Left    PARATHYROIDECTOMY     RIGHT HEART CATH N/A 03/29/2019   Procedure: RIGHT HEART CATH;  Surgeon: [name], MD;  Location: [facility];  Service: Cardiovascular;  Laterality: N/A;     reports that he has quit smoking. His smoking use included cigarettes. He has quit using smokeless tobacco.  His smokeless tobacco use included chew. He reports that he does not drink alcohol and does not use drugs.  Allergies  Allergen Reactions   Penicillins Rash    Family History  Problem Relation Age of Onset   Diabetes Mother        2003   Lung cancer Father    Diabetes Sister    Hypertension Sister    Heart  disease Sister    Cancer Sister    Bone cancer Brother     Prior to Admission medications   Medication Sig Start Date End Date Taking? Authorizing Provider  Accu-Chek Softclix Lancets lancets Use to check blood sugars 4 times a day   E11.65 08/20/21   [name], NP  apixaban (ELIQUIS) 5 MG TABS tablet Take 1 tablet (5 mg total) by mouth 2 (two) times daily. 12/20/21 01/19/22  Nolberto Hanlon, MD  Blood Glucose Monitoring Suppl (TRUE METRIX AIR GLUCOSE METER) DEVI 1 Device by Does not apply route 2 (two) times daily. 04/13/18   [name], NP  Budeson-Glycopyrrol-Formoterol (BREZTRI AEROSPHERE) 160-9-4.8 MCG/ACT AERO Inhale into the lungs. Samples given.... 2 puffs in the morning and 2 puffs at bedtime    [provider]  carvedilol (COREG) 12.5 MG tablet Take 1 tablet (12.5 mg total) by mouth 2 (two) times daily. 01/07/21 01/07/22  Arta Silence, MD  cetirizine (ZYRTEC) 10 MG tablet Take 10 mg by mouth daily as needed for allergies. Buys OTC    [provider]  cyanocobalamin 1000 MCG tablet Take 1 tablet (1,000 mcg total) by mouth daily. 12/13/21   Lavera Guise, MD  furosemide (LASIX) 40 MG tablet Take 1 tablet (40 mg total) by mouth daily. 12/21/21 01/20/22  Nolberto Hanlon, MD  furosemide (LASIX) 40 MG tablet Take 1 tablet (40 mg total) by mouth daily. Take lasix 83m po daily 12/20/21   ANolberto Hanlon MD  gabapentin (NEURONTIN) 100 MG capsule Take 1 capsule (100 mg total) by mouth in the morning, at noon, and at bedtime. 08/11/21   AJonetta Osgood NP  glimepiride (AMARYL) 2 MG tablet Take one tab po bid with meals 10/11/21   KLavera Guise MD  glucose blood (ACCU-CHEK GUIDE) test strip Use to check blood sugars 4 times a day   E11.65 12/13/21   KLavera Guise MD  hydrALAZINE (APRESOLINE) 50 MG tablet Take 1 tablet (50 mg total) by mouth 3 (three) times daily. 10/11/21   KLavera Guise MD  insulin glargine, 2 Unit Dial, (TOUJEO MAX SOLOSTAR) 300 UNIT/ML Solostar Pen Inject 20  units in am 11/02/21   KLavera Guise MD  ipratropium-albuterol (DUONEB) 0.5-2.5 (3) MG/3ML SOLN Take 3 mLs by nebulization every 6 (six) hours as needed. 06/29/21   Danford, CSuann Larry MD  NOVOFINE PEN NEEDLE 32G X 6 MM MISC Use as directed 12/12/21   KLavera Guise MD  omeprazole (PRILOSEC) 40 MG capsule Take 1 capsule once in the morning and once in the evening 12/13/21   KLavera Guise MD  potassium chloride SA (KLOR-CON M) 10 MEQ tablet Take 2 tablets (20 mEq total) by mouth daily. 12/20/21 01/19/22  ANolberto Hanlon MD  rosuvastatin (CRESTOR) 10 MG tablet Take 1 tablet (10 mg total) by mouth at bedtime. 08/11/21   AJonetta Osgood NP  TOUJEO SOLOSTAR 300 UNIT/ML Solostar Pen Inject into the skin. 11/24/21   [provider]    Physical Exam: Vitals:   01/03/22 1544 01/03/22 1800 01/03/22 1935 01/03/22 2159  BP: (!) 190/93 (!) 157/102 (!) 179/104 (!) 174/100  Pulse: 71 71 76 69  Resp: _0 Temp:   98.1 F (36.7 C) 97.7 F (36.5 C)  TempSrc:    Oral  SpO2: 98% 96% 95% 95%  Weight:      Height:       Physical Exam   Labs on Admission: I have personally reviewed following labs and imaging studies BMET Recent Labs  Lab 01/03/22 1246  NA 140  K 4.3  CL 112*  CO2 22  BUN 38*  CREATININE 2.30*  GLUCOSE 107*   Electrolytes Recent Labs  Lab 01/03/22 1246  CALCIUM 8.8*   Sepsis Markers Recent Labs  Lab 01/03/22 1546  LATICACIDVEN 0.7   ABG No results for input(s): "PHART", "PCO2ART", "PO2ART" in the last 168 hours. Liver Enzymes Recent Labs  Lab 01/03/22 1246  AST 20  ALT 18  ALKPHOS 82  BILITOT 0.7  ALBUMIN 3.3*   Cardiac Enzymes No results for input(s): "TROPONINI", "PROBNP" in the last 168 hours. Lab Results  Component Value Date   DDIMER 1.91 (H) 06/27/2021   Coag's No results for input(s): "APTT", "INR" in the last 168 hours.  No results found  for this or any previous visit (from the past 240 hour(s)).   Current Facility-Administered  Medications:    HYDROmorphone (DILAUDID) injection 0.5 mg, 0.5 mg, Intravenous, Once, Arta Silence, MD   meclizine (ANTIVERT) tablet 25 mg, 25 mg, Oral, Once, Arta Silence, MD   pantoprazole (PROTONIX) injection 40 mg, 40 mg, Intravenous, Q12H, Kadeja Granada V, MD   sodium chloride 0.9 % bolus 500 mL, 500 mL, Intravenous, Once, Arta Silence, MD  Current Outpatient Medications:    Accu-Chek Softclix Lancets lancets, Use to check blood sugars 4 times a day   E11.65, Disp: 400 each, Rfl: 3   apixaban (ELIQUIS) 5 MG TABS tablet, Take 1 tablet (5 mg total) by mouth 2 (two) times daily., Disp: 60 tablet, Rfl: 0   Blood Glucose Monitoring Suppl (TRUE METRIX AIR GLUCOSE METER) DEVI, 1 Device by Does not apply route 2 (two) times daily., Disp: 1 Device, Rfl: 0   Budeson-Glycopyrrol-Formoterol (BREZTRI AEROSPHERE) 160-9-4.8 MCG/ACT AERO, Inhale into the lungs. Samples given.... 2 puffs in the morning and 2 puffs at bedtime, Disp: , Rfl:    carvedilol (COREG) 12.5 MG tablet, Take 1 tablet (12.5 mg total) by mouth 2 (two) times daily., Disp: 60 tablet, Rfl: 11   cetirizine (ZYRTEC) 10 MG tablet, Take 10 mg by mouth daily as needed for allergies. Buys OTC, Disp: , Rfl:    cyanocobalamin 1000 MCG tablet, Take 1 tablet (1,000 mcg total) by mouth daily., Disp: 90 tablet, Rfl: 1   furosemide (LASIX) 40 MG tablet, Take 1 tablet (40 mg total) by mouth daily., Disp: 30 tablet, Rfl: 0   furosemide (LASIX) 40 MG tablet, Take 1 tablet (40 mg total) by mouth daily. Take lasix 30m po daily, Disp: 30 tablet, Rfl: 3   gabapentin (NEURONTIN) 100 MG capsule, Take 1 capsule (100 mg total) by mouth in the morning, at noon, and at bedtime., Disp: 270 capsule, Rfl: 1   glimepiride (AMARYL) 2 MG tablet, Take one tab po bid with meals, Disp: 180 tablet, Rfl: 3   glucose blood (ACCU-CHEK GUIDE) test strip, Use to check blood sugars 4 times a day   E11.65, Disp: 400 each, Rfl: 3   hydrALAZINE (APRESOLINE) 50 MG  tablet, Take 1 tablet (50 mg total) by mouth 3 (three) times daily., Disp: 270 tablet, Rfl: 1   insulin glargine, 2 Unit Dial, (TOUJEO MAX SOLOSTAR) 300 UNIT/ML Solostar Pen, Inject 20 units in am, Disp: 9 mL, Rfl: 3   ipratropium-albuterol (DUONEB) 0.5-2.5 (3) MG/3ML SOLN, Take 3 mLs by nebulization every 6 (six) hours as needed., Disp: 270 mL, Rfl: 3   NOVOFINE PEN NEEDLE 32G X 6 MM MISC, Use as directed, Disp: 100 each, Rfl: 0   omeprazole (PRILOSEC) 40 MG capsule, Take 1 capsule once in the morning and once in the evening, Disp: 180 capsule, Rfl: 1   potassium chloride SA (KLOR-CON M) 10 MEQ tablet, Take 2 tablets (20 mEq total) by mouth daily., Disp: , Rfl:    rosuvastatin (CRESTOR) 10 MG tablet, Take 1 tablet (10 mg total) by mouth at bedtime., Disp: 90 tablet, Rfl: 1   TOUJEO SOLOSTAR 300 UNIT/ML Solostar Pen, Inject into the skin., Disp: , Rfl:   COVID-19 Labs No results for input(s): "DDIMER", "FERRITIN", "LDH", "CRP" in the last 72 hours. Lab Results  Component Value Date   SARSCOV2NAA NEGATIVE 12/17/2021   [place]GATIVE 09/23/2021   [place]ast HemetNEGATIVE 09/16/2021   [place]NEGATIVE 08/05/2021    Radiological Exams on Admission: CT ABDOMEN PELVIS  WO CONTRAST  Result Date: 01/03/2022 CLINICAL DATA:  Hernia, complicated. Pt to ED for stabbing lower abdominal pain more to R side where has hernia. 9/10 pain EXAM: CT ABDOMEN AND PELVIS WITHOUT CONTRAST TECHNIQUE: Multidetector CT imaging of the abdomen and pelvis was performed following the standard protocol without IV contrast. RADIATION DOSE REDUCTION: This exam was performed according to the departmental dose-optimization program which includes automated exposure control, adjustment of the mA and/or kV according to patient size and/or use of iterative reconstruction technique. COMPARISON:  CT abdomen pelvis 11/30/2020 FINDINGS: Lower chest: Query enlarged atria bilaterally. Cardiac changes suggestive of anemia. No acute  abnormality. Hepatobiliary: No focal liver abnormality. Status post cholecystectomy. No biliary dilatation. Pancreas: No focal lesion. Normal pancreatic contour. No surrounding inflammatory changes. No main pancreatic ductal dilatation. Spleen: Multiple calcifications sick stress chin of sequelae of prior granulomatous disease. Otherwise normal in size without focal abnormality. Splenule noted. Adrenals/Urinary Tract: No adrenal nodule bilaterally. No nephrolithiasis and no hydronephrosis. Bilateral for dense lesions likely represent simple renal cysts. Simple renal cysts, in the absence of clinically indicated signs/symptoms, require no independent follow-up. Interval increase in size of a heterogeneous appearance 4.7 x 5 cm right inferior renal pole lesion with associated mural nodularity. No ureterolithiasis or hydroureter. The urinary bladder is unremarkable. Stomach/Bowel: Stomach is within normal limits. No evidence of bowel wall thickening or dilatation. Colonic diverticulosis. Appendix appears normal. Vascular/Lymphatic: No abdominal aorta or iliac aneurysm. Severe atherosclerotic plaque of the aorta and its branches. No abdominal, pelvic, or inguinal lymphadenopathy. Reproductive: Prominent prostate. Other: No intraperitoneal free fluid. No intraperitoneal free gas. No organized fluid collection. Musculoskeletal: Stable small to moderate volume fat containing umbilical hernia with an abdominal defect of 2 cm. No suspicious lytic or blastic osseous lesions. No acute displaced fracture. Multilevel degenerative changes of the spine. IMPRESSION: 1. Interval stable increase in size of a complex indeterminate 4.7 x 5 cm right inferior renal pole lesion concerning for malignancy/renal cell carcinoma. Markedly limited evaluation on this noncontrast study. When the patient is clinically stable and able to follow directions and hold their breath (preferably as an outpatient) further evaluation with dedicated  abdominal MRI renal protocol should be considered. 2. Colonic diverticulosis with no acute diverticulitis. 3. Stable small to moderate volume fat containing umbilical hernia. 4. Cardiomegaly. 5. Findings suggestive of anemia. 6.  Aortic Atherosclerosis (ICD10-I70.0). Electronically Signed   By: Iven Finn M.D.   On: 01/03/2022 16:34    EKG: Independently reviewed.  ***   Assessment and Plan: No notes have been filed under this hospital service. Service: Hospitalist      ***     DVT prophylaxis:  ***   Code Status:  ***   Family Communication:  Hamilton,Shelby (Sister)  [phone] (Mobile   Disposition Plan:  ***   Consults called:  ***  Admission status: ***       Para Skeans MD [facility]  6 PM- 2 AM. Please contact me via secure Chat 6 PM-2 AM. [phone] ( Pager ) To contact the [facility] Attending or Consulting provider [place] or covering provider during after hours [place], for this patient.   Check the care team in [facility] and look for a) attending/consulting [facility] provider listed and b) the [facility] team listed Log into [email or web address] and use [place]'s universal password to access. If you do not have the password, please contact the hospital operator. Locate the [facility] provider you are looking for under [facility] and page to a  number that you can be directly reached. If you still have difficulty reaching the provider, please page the [facility] (Director on Call) for the Hospitalists listed on amion for assistance. [email or web address] 01/03/2022, 11:16 PM

## 2022-01-03 NOTE — ED Provider Triage Note (Signed)
Emergency Medicine Provider Triage Evaluation Note  Chett Taniguchi Howlett , a 66 y.o. male  was evaluated in triage.  Pt complains of abdominal pain, states he has a hernia that is been draining.  Having pain in the area also states that the drainage has foul odor.  Last BM was this morning.  Review of Systems  Positive: See above Negative: Fever  Physical Exam  BP (!) 162/91 (BP Location: Left Arm)   Pulse 72   Temp 98.1 F (36.7 C) (Oral)   Resp 18   Ht '5\' 11"'$  (1.803 m)   Wt 113.4 kg   SpO2 98%   BMI 34.87 kg/m  Gen:   Awake, no distress   Resp:  Normal effort  MSK:   Moves extremities without difficulty  Other:    Medical Decision Making  Medically screening exam initiated at 12:24 PM.  Appropriate orders placed.  Abed Schar Veldhuizen was informed that the remainder of the evaluation will be completed by another provider, this initial triage assessment does not replace that evaluation, and the importance of remaining in the ED until their evaluation is complete.  Abdominal pain protocol started   Versie Starks, PA-C 01/03/22 1225

## 2022-01-03 NOTE — ED Notes (Signed)
Pt to ED for stabbing lower abdominal pain more to R side where has hernia. 9/10 pain.

## 2022-01-03 NOTE — ED Triage Notes (Signed)
Pt brought in by [facility] for co abd pain has hx of hernia.

## 2022-01-03 NOTE — Telephone Encounter (Signed)
Larry Callahan from [facility] home health [phone] abdominal hernia pain and drainage  scale  8-10 as per dr Clayborn Bigness advised her and pt need to go to ED

## 2022-01-03 NOTE — ED Notes (Signed)
Patient reports continued dizziness.

## 2022-01-03 NOTE — ED Notes (Signed)
Pt brought in from home by [facility] co abd pain, hx of hernia

## 2022-01-04 ENCOUNTER — Observation Stay: Payer: Medicare PPO

## 2022-01-04 ENCOUNTER — Encounter: Payer: Self-pay | Admitting: Internal Medicine

## 2022-01-04 DIAGNOSIS — K429 Umbilical hernia without obstruction or gangrene: Secondary | ICD-10-CM | POA: Diagnosis not present

## 2022-01-04 DIAGNOSIS — R42 Dizziness and giddiness: Secondary | ICD-10-CM | POA: Diagnosis not present

## 2022-01-04 DIAGNOSIS — I6782 Cerebral ischemia: Secondary | ICD-10-CM | POA: Diagnosis not present

## 2022-01-04 LAB — COMPREHENSIVE METABOLIC PANEL
ALT: 18 U/L (ref 0–44)
AST: 19 U/L (ref 15–41)
Albumin: 2.8 g/dL — ABNORMAL LOW (ref 3.5–5.0)
Alkaline Phosphatase: 73 U/L (ref 38–126)
Anion gap: 5 (ref 5–15)
BUN: 33 mg/dL — ABNORMAL HIGH (ref 8–23)
CO2: 22 mmol/L (ref 22–32)
Calcium: 8.6 mg/dL — ABNORMAL LOW (ref 8.9–10.3)
Chloride: 114 mmol/L — ABNORMAL HIGH (ref 98–111)
Creatinine, Ser: 2.02 mg/dL — ABNORMAL HIGH (ref 0.61–1.24)
GFR, Estimated: 35 mL/min — ABNORMAL LOW (ref >60)
Glucose, Bld: 93 mg/dL (ref 70–99)
Potassium: 4.1 mmol/L (ref 3.5–5.1)
Sodium: 141 mmol/L (ref 135–145)
Total Bilirubin: 0.8 mg/dL (ref 0.3–1.2)
Total Protein: 6.5 g/dL (ref 6.5–8.1)

## 2022-01-04 LAB — TYPE AND SCREEN
ABO/RH(D): A POS
Antibody Screen: NEGATIVE

## 2022-01-04 LAB — GLUCOSE, CAPILLARY
Glucose-Capillary: 73 mg/dL (ref 70–99)
Glucose-Capillary: 171 mg/dL — ABNORMAL HIGH (ref 70–99)

## 2022-01-04 LAB — CBC
HCT: 33.3 % — ABNORMAL LOW (ref 39.0–52.0)
Hemoglobin: 10.2 g/dL — ABNORMAL LOW (ref 13.0–17.0)
MCH: 26.7 pg (ref 26.0–34.0)
MCHC: 30.6 g/dL (ref 30.0–36.0)
MCV: 87.2 fL (ref 80.0–100.0)
Platelets: 170 10*3/uL (ref 150–400)
RBC: 3.82 MIL/uL — ABNORMAL LOW (ref 4.22–5.81)
RDW: 14.6 % (ref 11.5–15.5)
WBC: 5.5 10*3/uL (ref 4.0–10.5)
nRBC: 0 % (ref 0.0–0.2)

## 2022-01-04 LAB — C-REACTIVE PROTEIN: CRP: 1.1 mg/dL — ABNORMAL HIGH (ref <1.0)

## 2022-01-04 LAB — CK: Total CK: 82 U/L (ref 49–397)

## 2022-01-04 MED ORDER — FUROSEMIDE 40 MG PO TABS
40.0000 mg | ORAL_TABLET | Freq: Every day | ORAL | Status: DC
  Administered 2022-01-04: 40 mg via ORAL
  Filled 2022-01-04: qty 1

## 2022-01-04 MED ORDER — ONDANSETRON HCL 4 MG/2ML IJ SOLN: 4.0000 mg | Freq: Three times a day (TID) | INTRAMUSCULAR | Status: DC | PRN

## 2022-01-04 MED ORDER — SODIUM CHLORIDE 0.9% FLUSH: 3.0000 mL | Freq: Two times a day (BID) | INTRAVENOUS | Status: DC

## 2022-01-04 MED ORDER — ONDANSETRON HCL 4 MG/2ML IJ SOLN
INTRAMUSCULAR | Status: AC
  Administered 2022-01-04: 4 mg via INTRAVENOUS
  Filled 2022-01-04: qty 2

## 2022-01-04 MED ORDER — ORAL CARE MOUTH RINSE: 15.0000 mL | OROMUCOSAL | Status: DC | PRN

## 2022-01-04 MED ORDER — SACUBITRIL-VALSARTAN 24-26 MG PO TABS
1.0000 | ORAL_TABLET | Freq: Two times a day (BID) | ORAL | Status: DC
  Administered 2022-01-04: 1 via ORAL
  Filled 2022-01-04: qty 1

## 2022-01-04 MED ORDER — ROSUVASTATIN CALCIUM 10 MG PO TABS: 10.0000 mg | ORAL_TABLET | Freq: Every day | ORAL | Status: DC

## 2022-01-04 MED ORDER — HYDRALAZINE HCL 20 MG/ML IJ SOLN: 10.0000 mg | Freq: Four times a day (QID) | INTRAMUSCULAR | Status: DC | PRN

## 2022-01-04 MED ORDER — ORAL CARE MOUTH RINSE
15.0000 mL | OROMUCOSAL | Status: DC
  Administered 2022-01-04: 15 mL via OROMUCOSAL

## 2022-01-04 MED ORDER — ACETAMINOPHEN 650 MG RE SUPP: 650.0000 mg | Freq: Four times a day (QID) | RECTAL | Status: DC | PRN

## 2022-01-04 MED ORDER — HYDRALAZINE HCL 50 MG PO TABS
50.0000 mg | ORAL_TABLET | Freq: Three times a day (TID) | ORAL | Status: DC
  Administered 2022-01-04: 50 mg via ORAL
  Filled 2022-01-04: qty 1

## 2022-01-04 MED ORDER — GADOBUTROL 1 MMOL/ML IV SOLN
10.0000 mL | Freq: Once | INTRAVENOUS | Status: AC | PRN
  Administered 2022-01-04: 10 mL via INTRAVENOUS

## 2022-01-04 MED ORDER — SODIUM CHLORIDE 0.9% FLUSH: 3.0000 mL | INTRAVENOUS | Status: DC | PRN

## 2022-01-04 MED ORDER — VITAMIN B-12 1000 MCG PO TABS
1000.0000 ug | ORAL_TABLET | Freq: Every day | ORAL | Status: DC
  Administered 2022-01-04: 1000 ug via ORAL
  Filled 2022-01-04 (×2): qty 1

## 2022-01-04 MED ORDER — INSULIN ASPART 100 UNIT/ML IJ SOLN: 0.0000 [IU] | Freq: Three times a day (TID) | INTRAMUSCULAR | Status: DC

## 2022-01-04 MED ORDER — SODIUM CHLORIDE 0.9 % IV SOLN: 250.0000 mL | INTRAVENOUS | Status: DC | PRN

## 2022-01-04 MED ORDER — APIXABAN 5 MG PO TABS
5.0000 mg | ORAL_TABLET | Freq: Two times a day (BID) | ORAL | Status: DC
  Administered 2022-01-04: 5 mg via ORAL
  Filled 2022-01-04: qty 1

## 2022-01-04 MED ORDER — SODIUM CHLORIDE 0.9% FLUSH
3.0000 mL | Freq: Two times a day (BID) | INTRAVENOUS | Status: DC
  Administered 2022-01-04 (×2): 3 mL via INTRAVENOUS

## 2022-01-04 MED ORDER — NICOTINE 21 MG/24HR TD PT24
21.0000 mg | MEDICATED_PATCH | Freq: Every day | TRANSDERMAL | Status: DC
  Filled 2022-01-04: qty 1

## 2022-01-04 MED ORDER — CARVEDILOL 12.5 MG PO TABS
12.5000 mg | ORAL_TABLET | Freq: Two times a day (BID) | ORAL | Status: DC
  Administered 2022-01-04: 12.5 mg via ORAL
  Filled 2022-01-04: qty 1

## 2022-01-04 MED ORDER — ACETAMINOPHEN 325 MG PO TABS: 650.0000 mg | ORAL_TABLET | Freq: Four times a day (QID) | ORAL | Status: DC | PRN

## 2022-01-04 MED ORDER — PANTOPRAZOLE SODIUM 40 MG PO TBEC: 40.0000 mg | DELAYED_RELEASE_TABLET | Freq: Every day | ORAL | Status: DC

## 2022-01-04 MED ORDER — IPRATROPIUM-ALBUTEROL 0.5-2.5 (3) MG/3ML IN SOLN: 3.0000 mL | Freq: Four times a day (QID) | RESPIRATORY_TRACT | Status: DC | PRN

## 2022-01-04 NOTE — Assessment & Plan Note (Signed)
PPI therapy continued.

## 2022-01-04 NOTE — Discharge Summary (Signed)
Physician Discharge Summary   Patient: Larry Callahan MRN: [id number] DOB: 1955/11/25  Admit date:     01/03/2022  Discharge date: 01/04/22  Discharge Physician: Lorella Nimrod   PCP: Lavera Guise, MD   Recommendations at discharge:   Please obtain CBC and BMP in 1 week Follow-up with urology Follow-up with primary care provider.  Discharge Diagnoses: Principal Problem:   Dizziness and giddiness Active Problems:   Atherosclerotic heart disease of native coronary artery without angina pectoris   Atrial flutter (HCC)   Chronic systolic CHF (congestive heart failure) (HCC)   COPD (chronic obstructive pulmonary disease) (HCC)   Essential hypertension   Obstructive sleep apnea, adult   Gastroesophageal reflux disease without esophagitis   Cigarette nicotine dependence with nicotine-induced disorder   Uncontrolled type 2 diabetes mellitus with hyperglycemia (HCC)   Iron deficiency anemia   CKD (chronic kidney disease), stage IV (HCC)   Obesity (BMI 30-39.9)   Umbilical hernia   Hospital Course: Taken from H&P.  Larry Callahan is a 66 y.o. male seen in ed with complaints of abdominal pain. He received morphine and become dizzy afterwards.  No focal neurologic deficit.  Vitals remained stable.  Pt has past medical history of past medical history of heart disease with systolic congestive heart failure EF of 40 to 45% on his last echo in February 2023, Atrial fibrillation on Eliquis, anemia, hypertension, diabetes mellitus type 2, COPD, obstructive sleep apnea on CPAP, obesity with a BMI of 39.76, umbilical hernia, Chronic kidney disease stage IIIb with a GFR of 30.  CT abdomen with umbilical hernia with no obstruction and concern of enlarging right renal complex cyst which need further investigation.  Urology was consulted and they will see the patient as an outpatient for further management.  General surgery was also consulted for his concern of abdominal pain to  rule out hernia related pain but hernia was completely reducible and no sign of obstruction.  They were recommending outpatient follow-up and no surgical intervention needed.Marland Kitchen  His abdominal pain and dizziness improves and he appears to be at his baseline.  Renal functions remained stable and at baseline.  UA with protein urea.  Patient remained stable and is being discharged back home and advised to have a close follow-up with urology and general surgery along with his other providers.  Patient will continue his home medications and follow-up with his providers.   Assessment and Plan: * Dizziness and giddiness Differentials include TIA, CVA, orthostatic hypotension, blood pressure fluctuation, inner ear issues mastoid effusions. Fall precautions, orthostatic blood pressure checks, MRI of the brain as patient has a history of CVA along with inner ear evaluation.   Atrial flutter (HCC) Currently in sinus rhythm continue with Eliquis.   Atherosclerotic heart disease of native coronary artery without angina pectoris Atherosclerotic heart disease patient continued on his Coreg and apixaban and hydralazine and statin therapy. Patient is currently stable and chest pain-free with no anginal equivalent symptoms.  Chronic systolic CHF (congestive heart failure) (HCC) Compensated congestive heart failure: With dizziness until we get orthostatic blood pressure we will hold patient's Lasix continue patient's Coreg and hydralazine. Patient also continued on his statin therapy.  COPD (chronic obstructive pulmonary disease) (HCC) Stable no wheezing on exam as needed DuoNeb and albuterol as needed. Nicotine patch.  Essential hypertension Blood pressure (!) 174/100, pulse 69, temperature 97.7 F (36.5 C), temperature source Oral, resp. rate 18, height '5\' 11"'$  (1.803 m), weight 113.4 kg, SpO2 95 %.  Vitals:  01/03/22 1216 01/03/22 1544 01/03/22 1800 01/03/22 1935  BP: (!) 162/91 (!) 190/93 (!)  157/102 (!) 179/104   01/03/22 2159  BP: (!) 174/100   We will continue patient's home regimen of Coreg, hydralazine p.o.  With as needed IV hydralazine. Beta-blocker limited secondary to heart rate less than 80s.   Obstructive sleep apnea, adult CPAP per home settings.  Gastroesophageal reflux disease without esophagitis PPI therapy continued.  Umbilical hernia We will get general surgery consult to see patient. A.m. message sent to Dr. Peyton Najjar.  Obesity (BMI 30-39.9) Dietitian counseling for education on calorie counting carb counting compliance on medications etc. Also on eating appropriate portions of protein and carbs.  CKD (chronic kidney disease), stage IV (HCC) Lab Results  Component Value Date   CREATININE 2.30 (H) 01/03/2022   CREATININE 2.55 (H) 12/20/2021   CREATININE 2.34 (H) 12/19/2021  Kidney function stable at baseline. We will avoid contrast therapy. Renally dose all needed medications.    Iron deficiency anemia    Latest Ref Rng & Units 01/03/2022   12:46 PM 12/17/2021    7:03 PM 12/01/2021    5:43 AM  CBC  WBC 4.0 - 10.5 K/uL 6.1  6.9  6.9   Hemoglobin 13.0 - 17.0 g/dL 11.3  10.8  11.9   Hematocrit 39.0 - 52.0 % 35.5  34.0  37.3   Platelets 150 - 400 K/uL 193  186  198    Differentials include iron deficiency anemia and anemia of chronic kidney disease. Left we will follow patient's H&H and defer to primary care for additional work-up. Type and screen, PPI therapy hematology consult as deemed appropriate transfusion in the event hemoglobin drops to 8 or below.  Uncontrolled type 2 diabetes mellitus with hyperglycemia (HCC) Home regimen of glimepiride held to avoid hypoglycemia, Toujeo held and we will primarily manage patient on glycemic protocol and sliding scale regimen to avoid any side effects interactions renal clearance issues and hypoglycemia.   Cigarette nicotine dependence with nicotine-induced disorder Nicotine patch.   Consultants:  General surgery, urology Procedures performed: None Disposition: Home Diet recommendation:  Discharge Diet Orders (From admission, onward)     Start     Ordered   01/04/22 0000  Diet - low sodium heart healthy        01/04/22 1327           Cardiac and Carb modified diet DISCHARGE MEDICATION: Allergies as of 01/04/2022       Reactions   Penicillins Rash        Medication List     STOP taking these medications    chlorthalidone 25 MG tablet Commonly known as: HYGROTON   gabapentin 100 MG capsule Commonly known as: NEURONTIN   lidocaine 5 % Commonly known as: LIDODERM   predniSONE 20 MG tablet Commonly known as: DELTASONE       TAKE these medications    Accu-Chek Guide test strip Generic drug: glucose blood Use to check blood sugars 4 times a day   E11.65   Accu-Chek Softclix Lancets lancets Use to check blood sugars 4 times a day   E11.65   amLODipine 10 MG tablet Commonly known as: NORVASC Take 1 tablet by mouth daily.   apixaban 5 MG Tabs tablet Commonly known as: ELIQUIS Take 1 tablet (5 mg total) by mouth 2 (two) times daily. What changed: Another medication with the same name was removed. Continue taking this medication, and follow the directions you see here.   Breztri Aerosphere  160-9-4.8 MCG/ACT Aero Generic drug: Budeson-Glycopyrrol-Formoterol Inhale into the lungs. Samples given.... 2 puffs in the morning and 2 puffs at bedtime   budesonide 0.25 MG/2ML nebulizer solution Commonly known as: PULMICORT   carvedilol 12.5 MG tablet Commonly known as: Coreg Take 1 tablet (12.5 mg total) by mouth 2 (two) times daily.   cetirizine 10 MG tablet Commonly known as: ZYRTEC Take 10 mg by mouth daily as needed for allergies. Buys OTC   cyanocobalamin 1000 MCG tablet Take 1 tablet (1,000 mcg total) by mouth daily.   Entresto 24-26 MG Generic drug: sacubitril-valsartan   furosemide 40 MG tablet Commonly known as: LASIX Take 1 tablet (40 mg  total) by mouth daily. Take lasix '40mg'$  po daily What changed: Another medication with the same name was removed. Continue taking this medication, and follow the directions you see here.   glimepiride 2 MG tablet Commonly known as: AMARYL Take one tab po bid with meals   hydrALAZINE 100 MG tablet Commonly known as: APRESOLINE What changed: Another medication with the same name was removed. Continue taking this medication, and follow the directions you see here.   ipratropium-albuterol 0.5-2.5 (3) MG/3ML Soln Commonly known as: DUONEB Take 3 mLs by nebulization every 6 (six) hours as needed.   Novofine Pen Needle 32G X 6 MM Misc Generic drug: Insulin Pen Needle Use as directed   omeprazole 40 MG capsule Commonly known as: PRILOSEC Take 1 capsule once in the morning and once in the evening   potassium chloride 10 MEQ tablet Commonly known as: KLOR-CON M Take 2 tablets (20 mEq total) by mouth daily.   rosuvastatin 10 MG tablet Commonly known as: CRESTOR Take 1 tablet (10 mg total) by mouth at bedtime.   Toujeo Max SoloStar 300 UNIT/ML Solostar Pen Generic drug: insulin glargine (2 Unit Dial) Inject 20 units in am   Toujeo SoloStar 300 UNIT/ML Solostar Pen Generic drug: insulin glargine (1 Unit Dial) Inject into the skin.   True Metrix Air Glucose Meter Devi 1 Device by Does not apply route 2 (two) times daily.        Follow-up Information     Lavera Guise, MD. Schedule an appointment as soon as possible for a visit in 1 week(s).   Specialties: Internal Medicine, Cardiology Contact information: [place] [address] [phone]         [name], MD .   Specialty: Cardiology Contact information: [address] [place] [address] [phone]         Abbie Sons, MD. Schedule an appointment as soon as possible for a visit in 1 week(s).   Specialty: Urology Contact information: [place] [place] [address] [phone]                Discharge Exam: Danley Danker Weights   01/03/22 1217 01/04/22 0500  Weight: 113.4 kg 110.3 kg   General.  Obese gentleman, in no acute distress. Pulmonary.  Lungs clear bilaterally, normal respiratory effort. CV.  Regular rate and rhythm, no JVD, rub or murmur. Abdomen.  Soft, nontender, nondistended, BS positive.  Ventral hernia CNS.  Alert and oriented .  No focal neurologic deficit. Extremities.  No edema, no cyanosis, pulses intact and symmetrical. Psychiatry.  Judgment and insight appears normal.   Condition at discharge: stable  The results of significant diagnostics from this hospitalization (including imaging, microbiology, ancillary and laboratory) are listed below for reference.   Imaging Studies:  MR BRAIN/IAC W WO CONTRAST  Result Date: 01/04/2022 CLINICAL DATA:  Initial evaluation for acute dizziness, vertigo. EXAM: MRI HEAD WITHOUT AND WITH CONTRAST TECHNIQUE: Multiplanar, multiecho pulse sequences of the brain and surrounding structures were obtained without and with intravenous contrast. CONTRAST:  66m GADAVIST GADOBUTROL 1 MMOL/ML IV SOLN COMPARISON:  Prior MRI from 02/18/2021. FINDINGS: Brain: Examination degraded by motion artifact. Cerebral volume within normal limits. Probable mild chronic microvascular ischemic disease noted involving the periventricular white matter. No evidence for acute or subacute infarct. Gray-white matter differentiation maintained. No areas of chronic cortical infarction. No acute or chronic intracranial blood products. No mass lesion within the brain itself. No mass effect or midline shift. No hydrocephalus or extra-axial fluid collection. Pituitary gland and suprasellar region within normal limits. No abnormal enhancement within the brain. Thin section imaging through the internal auditory canals was performed. Seventh and eighth cranial nerves are seen coursing normally through the  cerebellopontine angle cisterns into the internal auditory canals. No CPA angle mass. No intracanalicular mass or abnormal enhancement. Inner ear structures including the vestibulae, cochlea, and semi circular canals are normal. Normal post ganglionic enhancement seen within the seventh cranial nerves bilaterally. Trace bilateral mastoid effusions noted, of doubtful significance. Visualized nasopharynx unremarkable. Vascular: Major intracranial vascular flow voids are maintained. Skull and upper cervical spine: Craniocervical junction within normal limits. Bone marrow signal intensity normal. No scalp soft tissue abnormality. Sinuses/Orbits: Globes and orbital soft tissues demonstrate no acute finding. Paranasal sinuses are largely clear. Other: None. IMPRESSION: 1. Negative IAC protocol MRI of the brain. No structural findings to explain patient's symptoms identified. 2. No other acute intracranial abnormality. 3. Underlying mild chronic microvascular ischemic disease. Electronically Signed   By: BJeannine BogaM.D.   On: 01/04/2022 02:52   CT ABDOMEN PELVIS WO CONTRAST  Result Date: 01/03/2022 CLINICAL DATA:  Hernia, complicated. Pt to ED for stabbing lower abdominal pain more to R side where has hernia. 9/10 pain EXAM: CT ABDOMEN AND PELVIS WITHOUT CONTRAST TECHNIQUE: Multidetector CT imaging of the abdomen and pelvis was performed following the standard protocol without IV contrast. RADIATION DOSE REDUCTION: This exam was performed according to the departmental dose-optimization program which includes automated exposure control, adjustment of the mA and/or kV according to patient size and/or use of iterative reconstruction technique. COMPARISON:  CT abdomen pelvis 11/30/2020 FINDINGS: Lower chest: Query enlarged atria bilaterally. Cardiac changes suggestive of anemia. No acute abnormality. Hepatobiliary: No focal liver abnormality. Status post cholecystectomy. No biliary dilatation. Pancreas: No focal  lesion. Normal pancreatic contour. No surrounding inflammatory changes. No main pancreatic ductal dilatation. Spleen: Multiple calcifications sick stress chin of sequelae of prior granulomatous disease. Otherwise normal in size without focal abnormality. Splenule noted. Adrenals/Urinary Tract: No adrenal nodule bilaterally. No nephrolithiasis and no hydronephrosis. Bilateral for dense lesions likely represent simple renal cysts. Simple renal cysts, in the absence of clinically indicated signs/symptoms, require no independent follow-up. Interval increase in size of a heterogeneous appearance 4.7 x 5 cm right inferior renal pole lesion with associated mural nodularity. No ureterolithiasis or hydroureter. The urinary bladder is unremarkable. Stomach/Bowel: Stomach is within normal limits. No evidence of bowel wall thickening or dilatation. Colonic diverticulosis. Appendix appears normal. Vascular/Lymphatic: No abdominal aorta or iliac aneurysm. Severe atherosclerotic plaque of the aorta and its branches. No abdominal, pelvic, or inguinal lymphadenopathy. Reproductive: Prominent prostate. Other: No intraperitoneal free fluid. No intraperitoneal free gas. No organized fluid collection. Musculoskeletal: Stable small to moderate volume fat containing umbilical hernia with an abdominal defect  of 2 cm. No suspicious lytic or blastic osseous lesions. No acute displaced fracture. Multilevel degenerative changes of the spine. IMPRESSION: 1. Interval stable increase in size of a complex indeterminate 4.7 x 5 cm right inferior renal pole lesion concerning for malignancy/renal cell carcinoma. Markedly limited evaluation on this noncontrast study. When the patient is clinically stable and able to follow directions and hold their breath (preferably as an outpatient) further evaluation with dedicated abdominal MRI renal protocol should be considered. 2. Colonic diverticulosis with no acute diverticulitis. 3. Stable small to moderate  volume fat containing umbilical hernia. 4. Cardiomegaly. 5. Findings suggestive of anemia. 6.  Aortic Atherosclerosis (ICD10-I70.0). Electronically Signed   By: Iven Finn M.D.   On: 01/03/2022 16:34   DG Chest 2 View  Result Date: 12/17/2021 CLINICAL DATA:  Shortness of breath.  Chest pain. EXAM: CHEST - 2 VIEW COMPARISON:  Chest x-ray September 23, 2021. FINDINGS: Patient rotation. Chronic enlargement the cardiac silhouette. Tortuous aorta. Mild streaky left basilar opacities. No confluent consolidation. No visible pleural effusions or pneumothorax. Degenerative changes of the thoracic spine. No acute osseous abnormality. IMPRESSION: 1. Mild streaky left basilar opacities, favor atelectasis. 2. Chronic cardiomegaly. Electronically Signed   By: Margaretha Sheffield M.D.   On: 12/17/2021 19:51    Microbiology: Results for orders placed or performed during the hospital encounter of 12/17/21  SARS Coronavirus 2 by RT PCR (hospital order, performed in [facility] hospital lab) *cepheid single result test* Anterior Nasal Swab     Status: None   Collection Time: 12/17/21 11:43 PM   Specimen: Anterior Nasal Swab  Result Value Ref Range Status   SARS Coronavirus 2 by RT PCR NEGATIVE NEGATIVE Final    Comment: (NOTE) SARS-CoV-2 target nucleic acids are NOT DETECTED.  The SARS-CoV-2 RNA is generally detectable in upper and lower respiratory specimens during the acute phase of infection. The lowest concentration of SARS-CoV-2 viral copies this assay can detect is 250 copies / mL. A negative result does not preclude SARS-CoV-2 infection and should not be used as the sole basis for treatment or other patient management decisions.  A negative result may occur with improper specimen collection / handling, submission of specimen other than nasopharyngeal swab, presence of viral mutation(s) within the areas targeted by this assay, and inadequate number of viral copies (<250 copies / mL). A negative result must  be combined with clinical observations, patient history, and epidemiological information.  Fact Sheet for Patients:   https://www.patel.info/  Fact Sheet for Healthcare Providers: https://hall.com/  This test is not yet approved or  cleared by the [place] FDA and has been authorized for detection and/or diagnosis of SARS-CoV-2 by FDA under an Emergency Use Authorization (EUA).  This EUA will remain in effect (meaning this test can be used) for the duration of the COVID-19 declaration under Section 564(b)(1) of the Act, 21 U.S.C. section 360bbb-3(b)(1), unless the authorization is terminated or revoked sooner.  Performed at [facility], [address] [place], [place] [address]     Labs: CBC: Recent Labs  Lab 01/03/22 1246 01/04/22 0409  WBC 6.1 5.5  HGB 11.3* 10.2*  HCT 35.5* 33.3*  MCV 85.7 87.2  PLT 193 267   Basic Metabolic Panel: Recent Labs  Lab 01/03/22 1246 01/04/22 0409  NA 140 141  K 4.3 4.1  CL 112* 114*  CO2 22 22  GLUCOSE 107* 93  BUN 38* 33*  CREATININE 2.30* 2.02*  CALCIUM 8.8* 8.6*   Liver Function Tests:  Recent Labs  Lab 01/03/22 1246 01/04/22 0409  AST 20 19  ALT 18 18  ALKPHOS 82 73  BILITOT 0.7 0.8  PROT 7.4 6.5  ALBUMIN 3.3* 2.8*   CBG: Recent Labs  Lab 01/04/22 0740 01/04/22 1018  GLUCAP 73 171*    Discharge time spent: greater than 30 minutes.  This record has been created using Systems analyst. Errors have been sought and corrected,but may not always be located. Such creation errors do not reflect on the standard of care.   Signed: Lorella Nimrod, MD [facility] 01/04/2022

## 2022-01-04 NOTE — Discharge Instructions (Signed)

## 2022-01-04 NOTE — Assessment & Plan Note (Signed)
Currently in sinus rhythm continue with Eliquis.

## 2022-01-04 NOTE — ED Notes (Signed)
Patient resting in bed free from sign of distress. Breathing unlabored speaking in full sentences with symmetric chest rise and fall. Bed low and locked with side rails raised x2. Call bell in reach and monitor in place.   

## 2022-01-04 NOTE — Assessment & Plan Note (Signed)
-  Nicotine patch 

## 2022-01-04 NOTE — Hospital Course (Addendum)
Taken from H&P.  Larry Callahan is a 66 y.o. male seen in ed with complaints of abdominal pain. He received morphine and become dizzy afterwards.  No focal neurologic deficit.  Vitals remained stable.  Pt has past medical history of past medical history of heart disease with systolic congestive heart failure EF of 40 to 45% on his last echo in February 2023, Atrial fibrillation on Eliquis, anemia, hypertension, diabetes mellitus type 2, COPD, obstructive sleep apnea on CPAP, obesity with a BMI of 40.34, umbilical hernia, Chronic kidney disease stage IIIb with a GFR of 30.  CT abdomen with umbilical hernia with no obstruction and concern of enlarging right renal complex cyst which need further investigation.  Urology was consulted and they will see the patient as an outpatient for further management.  General surgery was also consulted for his concern of abdominal pain to rule out hernia related pain but hernia was completely reducible and no sign of obstruction.  They were recommending outpatient follow-up and no surgical intervention needed.Marland Kitchen  His abdominal pain and dizziness improves and he appears to be at his baseline.  Renal functions remained stable and at baseline.  UA with protein urea.  Patient remained stable and is being discharged back home and advised to have a close follow-up with urology and general surgery along with his other providers.  Patient will continue his home medications and follow-up with his providers.

## 2022-01-04 NOTE — Assessment & Plan Note (Signed)
CPAP per home settings.   

## 2022-01-04 NOTE — Progress Notes (Signed)
Nutrition Brief Note  RD consulted for assessment of nutritional requirements/ status.   Wt Readings from Last 15 Encounters:  01/04/22 110.3 kg  12/20/21 113 kg  12/16/21 116.7 kg  12/06/21 116 kg  11/30/21 116.1 kg  11/04/21 115.7 kg  11/02/21 113.4 kg  10/12/21 115.6 kg  10/11/21 104.8 kg  10/05/21 118.6 kg  09/23/21 113.4 kg  09/21/21 120.6 kg  08/19/21 114.1 kg  08/11/21 117.5 kg  08/08/21 116.7 kg   Pt admitted with complaints of abdominal pain.   Spoke with pt, who was sitting in recliner chair eating lunch at time of visit. Pt reports good appetite, consuming 100% of meals.   Per pt, he eats a lot of protein and usually grills or bakes meats. Pt has refrained for eating sweets and canned food ("I try not to eat anything that's bad for me").   Pt denies any additional questions or concerns. He reports he is getting discharged home today.   RD provided "Heart Healthy, Consistent Carbohydrate Nutrition Therapy" handout from [facility] Care Manual. Attached to AVS/ discharge summary.   Nutrition-Focused physical exam completed. Findings are no fat depletion, no muscle depletion, and mild edema.    Lab Results  Component Value Date   HGBA1C 7.0 (H) 08/06/2021   PTA DM medications are 20 units insulin glargine daily and 2 mg amaryl BID.   Labs reviewed: CBGS: 73-171 (inpatient orders for glycemic control are 0-15 units insulin aspart TID with meals).    Body mass index is 33.91 kg/m. Patient meets criteria for obesity, class II based on current BMI. Obesity is a complex, chronic medical condition that is optimally managed by a multidisciplinary care team. Weight loss is not an ideal goal for an acute inpatient hospitalization. However, if further work-up for obesity is warranted, consider outpatient referral to outpatient bariatric service and/or [facility].    Current diet order is heart healthy/ carb modified, patient is  consuming approximately 100% of meals at this time. Labs and medications reviewed.   No nutrition interventions warranted at this time. If nutrition issues arise, please consult RD.   Loistine Chance, RD, LDN, [place] Registered Dietitian II Certified Diabetes Care and Education Specialist Please refer to [facility] for RD and/or RD on-call/weekend/after hours pager

## 2022-01-04 NOTE — ED Notes (Signed)
Patient resting in bed free from sign of distress. Breathing unlabored speaking in full sentences with symmetric chest rise and fall. Bed low and locked with side rails raised x2. Call bell in reach and monitor in place. Pt provided with urinal per request.

## 2022-01-04 NOTE — Assessment & Plan Note (Signed)
We will get general surgery consult to see patient. A.m. message sent to Dr. Peyton Najjar.

## 2022-01-04 NOTE — Assessment & Plan Note (Signed)
Atherosclerotic heart disease patient continued on his Coreg and apixaban and hydralazine and statin therapy. Patient is currently stable and chest pain-free with no anginal equivalent symptoms.

## 2022-01-04 NOTE — Assessment & Plan Note (Signed)
Differentials include TIA, CVA, orthostatic hypotension, blood pressure fluctuation, inner ear issues mastoid effusions. Fall precautions, orthostatic blood pressure checks, MRI of the brain as patient has a history of CVA along with inner ear evaluation.

## 2022-01-04 NOTE — Assessment & Plan Note (Signed)
    Latest Ref Rng & Units 01/03/2022   12:46 PM 12/17/2021    7:03 PM 12/01/2021    5:43 AM  CBC  WBC 4.0 - 10.5 K/uL 6.1  6.9  6.9   Hemoglobin 13.0 - 17.0 g/dL 11.3  10.8  11.9   Hematocrit 39.0 - 52.0 % 35.5  34.0  37.3   Platelets 150 - 400 K/uL 193  186  198    Differentials include iron deficiency anemia and anemia of chronic kidney disease. Left we will follow patient's H&H and defer to primary care for additional work-up. Type and screen, PPI therapy hematology consult as deemed appropriate transfusion in the event hemoglobin drops to 8 or below.

## 2022-01-04 NOTE — Assessment & Plan Note (Signed)
Lab Results  Component Value Date   CREATININE 2.30 (H) 01/03/2022   CREATININE 2.55 (H) 12/20/2021   CREATININE 2.34 (H) 12/19/2021  Kidney function stable at baseline. We will avoid contrast therapy. Renally dose all needed medications.

## 2022-01-04 NOTE — Assessment & Plan Note (Signed)
Home regimen of glimepiride held to avoid hypoglycemia, Toujeo held and we will primarily manage patient on glycemic protocol and sliding scale regimen to avoid any side effects interactions renal clearance issues and hypoglycemia.

## 2022-01-04 NOTE — Assessment & Plan Note (Signed)
Stable no wheezing on exam as needed DuoNeb and albuterol as needed. Nicotine patch.

## 2022-01-04 NOTE — Assessment & Plan Note (Signed)
Blood pressure (!) 174/100, pulse 69, temperature 97.7 F (36.5 C), temperature source Oral, resp. rate 18, height '5\' 11"'$  (1.803 m), weight 113.4 kg, SpO2 95 %.  Vitals:   01/03/22 1216 01/03/22 1544 01/03/22 1800 01/03/22 1935  BP: (!) 162/91 (!) 190/93 (!) 157/102 (!) 179/104   01/03/22 2159  BP: (!) 174/100   We will continue patient's home regimen of Coreg, hydralazine p.o.  With as needed IV hydralazine. Beta-blocker limited secondary to heart rate less than 80s.

## 2022-01-04 NOTE — Consult Note (Signed)
SURGICAL CONSULTATION NOTE   HISTORY OF PRESENT ILLNESS (HPI):  66 y.o. male presented to [facility] ED for evaluation of abdominal pain. Patient reports he came to hospital due to abdominal pain and umbilical hernia.  Pain started yesterday.  Pain localized patient medically.  No pain radiation.  Aggravating factor is applying pressure.  Cannot identify any alleviating factors.  In the ED he was found with normal white blood cell count.  Normal lactic acid.  He had a CT scan of the abdomen and pelvis that shows stable fat-containing hernia.  CT scan also shows a complex cyst on the right kidney that has been growing in size.  There is no sign of bowel obstruction.  I personally evaluated the images.  He was admitted to the hospital due to dizziness.  Surgery is consulted by [name] in this context for evaluation and management of medical hernia.  PAST MEDICAL HISTORY (PMH):  Past Medical History:  Diagnosis Date   Allergy    Atrial flutter ([place])    a. Dx 12/2020--CHA2DS2VASc = 5-->eliquis.   Chronic combined systolic (congestive) and diastolic (congestive) heart failure ([place])    a. 04/2019 Echo: EF 45-50%; b. 02/2019 Echo: EF 55-60%; c. 09/2020 Echo: EF 35-40%, glob HK. Nl RV size/fxn. Mild MR; d. 07/2021 Echo: EF 40-45%, mild LVH, low-nl RV fxn, no MR.   CKD (chronic kidney disease), stage III (HCC)    COPD (chronic obstructive pulmonary disease) ([place])    Coronary artery disease    a. 2011 Cath: nonobs dzs; b. 09/2020 MV: EF 38%. No signif ischemia. ? inf MI vs diaph attenuation. GI uptake artifact.   Diabetes mellitus without complication (HCC)    GERD (gastroesophageal reflux disease)    History of CVA (cerebrovascular accident) 09/25/2020   Hyperlipidemia    Hypertension    Morbid obesity ([place])    NICM (nonischemic cardiomyopathy) ([place])    a. 04/2019 Echo: EF 45-50%; b. 02/2019 Echo: EF 55-60%; c. 09/2020 Echo: EF 35-40%, glob HK; d. 09/2020 MV: No ischemia. ? inf infarct vs attenuation; d.  07/2021 Echo: EF 40-45%.   OSA (obstructive sleep apnea)    Pneumonia due to COVID-19 virus 01/30/2020   Stage 3b chronic kidney disease ([place])    Syncope and collapse 11/16/2020     PAST SURGICAL HISTORY ([place]):  Past Surgical History:  Procedure Laterality Date   BACK SURGERY     CARDIAC CATHETERIZATION     CHOLECYSTECTOMY     COLONOSCOPY WITH PROPOFOL N/A 04/12/2019   Procedure: COLONOSCOPY WITH PROPOFOL;  Surgeon: [name], MD;  Location: [name] ENDOSCOPY;  Service: Gastroenterology;  Laterality: N/A;   ESOPHAGOGASTRODUODENOSCOPY N/A 04/12/2019   Procedure: ESOPHAGOGASTRODUODENOSCOPY (EGD);  Surgeon: [name], MD;  Location: [facility] ENDOSCOPY;  Service: Gastroenterology;  Laterality: N/A;   ESOPHAGOGASTRODUODENOSCOPY (EGD) WITH PROPOFOL N/A 11/21/2019   Procedure: ESOPHAGOGASTRODUODENOSCOPY (EGD) WITH PROPOFOL;  Surgeon: [name], MD;  Location: [facility] ENDOSCOPY;  Service: Gastroenterology;  Laterality: N/A;   FLEXIBLE BRONCHOSCOPY Bilateral 05/17/2019   Procedure: FLEXIBLE BRONCHOSCOPY;  Surgeon: [name], MD;  Location: [name] ORS;  Service: Pulmonary;  Laterality: Bilateral;   left arm surgery     nephrectomy Left    PARATHYROIDECTOMY     RIGHT HEART CATH N/A 03/29/2019   Procedure: RIGHT HEART CATH;  Surgeon: [name], MD;  Location: [facility] CV LAB;  Service: Cardiovascular;  Laterality: N/A;     MEDICATIONS:  Prior to Admission medications   Medication Sig Start Date End  Date Taking? Authorizing Provider  Accu-Chek Softclix Lancets lancets Use to check blood sugars 4 times a day   E11.65 08/20/21   [name], NP  apixaban (ELIQUIS) 5 MG TABS tablet Take 1 tablet (5 mg total) by mouth 2 (two) times daily. 12/20/21 01/19/22  Nolberto Hanlon, MD  Blood Glucose Monitoring Suppl (TRUE METRIX AIR GLUCOSE METER) DEVI 1 Device by Does not apply route 2 (two) times daily. 04/13/18   [name], NP  Budeson-Glycopyrrol-Formoterol (BREZTRI  AEROSPHERE) 160-9-4.8 MCG/ACT AERO Inhale into the lungs. Samples given.... 2 puffs in the morning and 2 puffs at bedtime    [provider]  carvedilol (COREG) 12.5 MG tablet Take 1 tablet (12.5 mg total) by mouth 2 (two) times daily. 01/07/21 01/07/22  Arta Silence, MD  cetirizine (ZYRTEC) 10 MG tablet Take 10 mg by mouth daily as needed for allergies. Buys OTC    [provider]  cyanocobalamin 1000 MCG tablet Take 1 tablet (1,000 mcg total) by mouth daily. 12/13/21   Lavera Guise, MD  furosemide (LASIX) 40 MG tablet Take 1 tablet (40 mg total) by mouth daily. 12/21/21 01/20/22  Nolberto Hanlon, MD  furosemide (LASIX) 40 MG tablet Take 1 tablet (40 mg total) by mouth daily. Take lasix 8m po daily 12/20/21   ANolberto Hanlon MD  gabapentin (NEURONTIN) 100 MG capsule Take 1 capsule (100 mg total) by mouth in the morning, at noon, and at bedtime. 08/11/21   AJonetta Osgood NP  glimepiride (AMARYL) 2 MG tablet Take one tab po bid with meals 10/11/21   KLavera Guise MD  glucose blood (ACCU-CHEK GUIDE) test strip Use to check blood sugars 4 times a day   E11.65 12/13/21   KLavera Guise MD  hydrALAZINE (APRESOLINE) 50 MG tablet Take 1 tablet (50 mg total) by mouth 3 (three) times daily. 10/11/21   KLavera Guise MD  insulin glargine, 2 Unit Dial, (TOUJEO MAX SOLOSTAR) 300 UNIT/ML Solostar Pen Inject 20 units in am 11/02/21   KLavera Guise MD  ipratropium-albuterol (DUONEB) 0.5-2.5 (3) MG/3ML SOLN Take 3 mLs by nebulization every 6 (six) hours as needed. 06/29/21   Danford, CSuann Larry MD  NOVOFINE PEN NEEDLE 32G X 6 MM MISC Use as directed 12/12/21   KLavera Guise MD  omeprazole (PRILOSEC) 40 MG capsule Take 1 capsule once in the morning and once in the evening 12/13/21   KLavera Guise MD  potassium chloride SA (KLOR-CON M) 10 MEQ tablet Take 2 tablets (20 mEq total) by mouth daily. 12/20/21 01/19/22  ANolberto Hanlon MD  rosuvastatin (CRESTOR) 10 MG tablet Take 1 tablet (10 mg total) by mouth at  bedtime. 08/11/21   AJonetta Osgood NP  TOUJEO SOLOSTAR 300 UNIT/ML Solostar Pen Inject into the skin. 11/24/21   [provider]     ALLERGIES:  Allergies  Allergen Reactions   Penicillins Rash     SOCIAL HISTORY:  Social History   Socioeconomic History   Marital status: Single    Spouse name: Not on file   Number of children: Not on file   Years of education: Not on file   Highest education level: Not on file  Occupational History   Not on file  Tobacco Use   Smoking status: Former    Types: Cigarettes   Smokeless tobacco: Former    Types: CNurse, children'sUse: Never used  Substance and Sexual Activity   Alcohol use: No  Drug use: No   Sexual activity: Not Currently  Other Topics Concern   Not on file  Social History Narrative   Not on file   Social Determinants of Health   Financial Resource Strain: Low Risk  (05/12/2021)   Overall Financial Resource Strain (CARDIA)    Difficulty of Paying Living Expenses: Not very hard  Food Insecurity: Not on file  Transportation Needs: Not on file  Physical Activity: Not on file  Stress: Not on file  Social Connections: Not on file  Intimate Partner Violence: Not on file      FAMILY HISTORY:  Family History  Problem Relation Age of Onset   Diabetes Mother        2003   Lung cancer Father    Diabetes Sister    Hypertension Sister    Heart disease Sister    Cancer Sister    Bone cancer Brother      REVIEW OF SYSTEMS:  Constitutional: denies weight loss, fever, chills, or sweats  Eyes: denies any other vision changes, history of eye injury  ENT: denies sore throat, hearing problems  Respiratory: Positive shortness of breath, wheezing  Cardiovascular: Positive chest pain, palpitations  Gastrointestinal: Positive abdominal pain Genitourinary: denies burning with urination or urinary frequency Musculoskeletal: denies any other joint pains or cramps  Skin: denies any other rashes or skin  discolorations  Neurological: denies any other headache, weakness.  Positive for dizziness Psychiatric: denies any other depression, anxiety   All other review of systems were negative   VITAL SIGNS:  Temp:  [97.7 F (36.5 C)-98.4 F (36.9 C)] 98.1 F (36.7 C) (07/25 0740) Pulse Rate:  [56-76] 56 (07/25 0740) Resp:  [10-20] 18 (07/25 0740) BP: (135-190)/(65-104) 174/65 (07/25 0740) SpO2:  [95 %-99 %] 95 % (07/25 0740) Weight:  [110.3 kg-113.4 kg] 110.3 kg (07/25 0500)     Height: _0  (180.3 cm) Weight: 110.3 kg BMI (Calculated): 33.93   INTAKE/OUTPUT:  This shift: No intake/output data recorded.  Last 2 shifts: _1 @   PHYSICAL EXAM:  Constitutional:  -- Normal body habitus  -- Awake, alert, and oriented x3  Eyes:  -- Pupils equally round and reactive to light  -- No scleral icterus  Ear, nose, and throat:  -- No jugular venous distension  Pulmonary:  -- No crackles  -- Equal breath sounds bilaterally -- Breathing non-labored at rest Cardiovascular:  -- S1, S2 present  -- No pericardial rubs Gastrointestinal:  -- Abdomen soft, nontender, non-distended, no guarding or rebound tenderness.  Reducible umbilical hernia, soft. -- No abdominal masses appreciated, pulsatile or otherwise  Musculoskeletal and Integumentary:  -- Wounds: None appreciated -- Extremities: B/L UE and LE FROM, hands and feet warm, no edema  Neurologic:  -- Motor function: intact and symmetric -- Sensation: intact and symmetric   Labs:     Latest Ref Rng & Units 01/04/2022    4:09 AM 01/03/2022   12:46 PM 12/17/2021    7:03 PM  CBC  WBC 4.0 - 10.5 K/uL 5.5  6.1  6.9   Hemoglobin 13.0 - 17.0 g/dL 10.2  11.3  10.8   Hematocrit 39.0 - 52.0 % 33.3  35.5  34.0   Platelets 150 - 400 K/uL 170  193  186       Latest Ref Rng & Units 01/04/2022    4:09 AM 01/03/2022   12:46 PM 12/20/2021    2:41 AM  CMP  Glucose 70 - 99 mg/dL 93  107  140  BUN 8 - 23 mg/dL 33  38  52   Creatinine 0.61  - 1.24 mg/dL 2.02  2.30  2.55   Sodium 135 - 145 mmol/L 141  140  139   Potassium 3.5 - 5.1 mmol/L 4.1  4.3  4.8   Chloride 98 - 111 mmol/L 114  112  111   CO2 22 - 32 mmol/L _0 Calcium 8.9 - 10.3 mg/dL 8.6  8.8  8.5   Total Protein 6.5 - 8.1 g/dL 6.5  7.4    Total Bilirubin 0.3 - 1.2 mg/dL 0.8  0.7    Alkaline Phos 38 - 126 U/L 73  82    AST 15 - 41 U/L 19  20    ALT 0 - 44 U/L 18  18      Imaging studies:  EXAM: CT ABDOMEN AND PELVIS WITHOUT CONTRAST   TECHNIQUE: Multidetector CT imaging of the abdomen and pelvis was performed following the standard protocol without IV contrast.   RADIATION DOSE REDUCTION: This exam was performed according to the departmental dose-optimization program which includes automated exposure control, adjustment of the mA and/or kV according to patient size and/or use of iterative reconstruction technique.   COMPARISON:  CT abdomen pelvis 11/30/2020   FINDINGS: Lower chest: Query enlarged atria bilaterally. Cardiac changes suggestive of anemia. No acute abnormality.   Hepatobiliary: No focal liver abnormality. Status post cholecystectomy. No biliary dilatation.   Pancreas: No focal lesion. Normal pancreatic contour. No surrounding inflammatory changes. No main pancreatic ductal dilatation.   Spleen: Multiple calcifications sick stress chin of sequelae of prior granulomatous disease. Otherwise normal in size without focal abnormality. Splenule noted.   Adrenals/Urinary Tract:   No adrenal nodule bilaterally.   No nephrolithiasis and no hydronephrosis. Bilateral for dense lesions likely represent simple renal cysts. Simple renal cysts, in the absence of clinically indicated signs/symptoms, require no independent follow-up. Interval increase in size of a heterogeneous appearance 4.7 x 5 cm right inferior renal pole lesion with associated mural nodularity.   No ureterolithiasis or hydroureter.   The urinary bladder is  unremarkable.   Stomach/Bowel: Stomach is within normal limits. No evidence of bowel wall thickening or dilatation. Colonic diverticulosis. Appendix appears normal.   Vascular/Lymphatic: No abdominal aorta or iliac aneurysm. Severe atherosclerotic plaque of the aorta and its branches. No abdominal, pelvic, or inguinal lymphadenopathy.   Reproductive: Prominent prostate.   Other: No intraperitoneal free fluid. No intraperitoneal free gas. No organized fluid collection.   Musculoskeletal:   Stable small to moderate volume fat containing umbilical hernia with an abdominal defect of 2 cm.   No suspicious lytic or blastic osseous lesions. No acute displaced fracture. Multilevel degenerative changes of the spine.   IMPRESSION: 1. Interval stable increase in size of a complex indeterminate 4.7 x 5 cm right inferior renal pole lesion concerning for malignancy/renal cell carcinoma. Markedly limited evaluation on this noncontrast study. When the patient is clinically stable and able to follow directions and hold their breath (preferably as an outpatient) further evaluation with dedicated abdominal MRI renal protocol should be considered. 2. Colonic diverticulosis with no acute diverticulitis. 3. Stable small to moderate volume fat containing umbilical hernia. 4. Cardiomegaly. 5. Findings suggestive of anemia. 6.  Aortic Atherosclerosis (ICD10-I70.0).     Electronically Signed   By: Iven Finn M.D.   On: 01/03/2022 16:34    Assessment/Plan:  66 y.o. male with umbilical hernia, complicated by pertinent comorbidities including CHF, COPD, hypertension,  CKD, A-flutter on Eliquis.  Patient present to the ED due to concern of pain on the medical hernia.  He was actually admitted due to dizziness.  Patient with recent admission 2 weeks ago due to atypical chest pain with history of coronary artery disease.  Patient also with atrial flutter on Eliquis.  On physical exam the  umbilical hernia is very soft and completely reducible.  No sign of complication.  CT scan concerning for a complex cyst of the right kidney that is growing in size and radiologist recommend to rule out malignancy.  I think that these warrants further work-up before doing any hernia surgery.  From the hernia standpoint, the hernia is soft, depressible, uncomplicated, stable from previous CT scan and no further work-up is needed.  Patient needs cardiovascular risk assessment to see if he is a candidate for general anesthesia in the future.  This can be done as outpatient.  No indication for urgent surgical management during this admission for hernia.  General surgery will sign off.  Arnold Long, MD

## 2022-01-04 NOTE — Assessment & Plan Note (Signed)
Dietitian counseling for education on calorie counting carb counting compliance on medications etc. Also on eating appropriate portions of protein and carbs.

## 2022-01-04 NOTE — Assessment & Plan Note (Signed)
Compensated congestive heart failure: With dizziness until we get orthostatic blood pressure we will hold patient's Lasix continue patient's Coreg and hydralazine. Patient also continued on his statin therapy.

## 2022-01-05 ENCOUNTER — Telehealth: Payer: Self-pay | Admitting: Urology

## 2022-01-05 NOTE — Telephone Encounter (Signed)
New appt sched LM for patient to call back. Sharyn Lull

## 2022-01-07 ENCOUNTER — Telehealth: Payer: Self-pay | Admitting: Family

## 2022-01-07 ENCOUNTER — Encounter: Payer: Self-pay | Admitting: Family

## 2022-01-07 ENCOUNTER — Ambulatory Visit (HOSPITAL_BASED_OUTPATIENT_CLINIC_OR_DEPARTMENT_OTHER): Payer: Medicare PPO | Admitting: Family

## 2022-01-07 ENCOUNTER — Other Ambulatory Visit
Admission: RE | Admit: 2022-01-07 | Discharge: 2022-01-07 | Disposition: A | Discharge status: Home / Self Care | Payer: Medicare PPO | Source: Ambulatory Visit | Attending: Family | Admitting: Family

## 2022-01-07 ENCOUNTER — Telehealth: Payer: Self-pay

## 2022-01-07 VITALS — BP 174/97 | HR 60 | Resp 20 | Ht 71.0 in | Wt 251.2 lb

## 2022-01-07 DIAGNOSIS — D631 Anemia in chronic kidney disease: Secondary | ICD-10-CM | POA: Insufficient documentation

## 2022-01-07 DIAGNOSIS — J449 Chronic obstructive pulmonary disease, unspecified: Secondary | ICD-10-CM | POA: Insufficient documentation

## 2022-01-07 DIAGNOSIS — F419 Anxiety disorder, unspecified: Secondary | ICD-10-CM | POA: Insufficient documentation

## 2022-01-07 DIAGNOSIS — I5020 Unspecified systolic (congestive) heart failure: Secondary | ICD-10-CM

## 2022-01-07 DIAGNOSIS — Z794 Long term (current) use of insulin: Secondary | ICD-10-CM

## 2022-01-07 DIAGNOSIS — I428 Other cardiomyopathies: Secondary | ICD-10-CM | POA: Insufficient documentation

## 2022-01-07 DIAGNOSIS — I509 Heart failure, unspecified: Secondary | ICD-10-CM | POA: Diagnosis not present

## 2022-01-07 DIAGNOSIS — Z79899 Other long term (current) drug therapy: Secondary | ICD-10-CM | POA: Insufficient documentation

## 2022-01-07 DIAGNOSIS — I890 Lymphedema, not elsewhere classified: Secondary | ICD-10-CM

## 2022-01-07 DIAGNOSIS — G4733 Obstructive sleep apnea (adult) (pediatric): Secondary | ICD-10-CM

## 2022-01-07 DIAGNOSIS — I130 Hypertensive heart and chronic kidney disease with heart failure and stage 1 through stage 4 chronic kidney disease, or unspecified chronic kidney disease: Secondary | ICD-10-CM | POA: Insufficient documentation

## 2022-01-07 DIAGNOSIS — N184 Chronic kidney disease, stage 4 (severe): Secondary | ICD-10-CM | POA: Diagnosis not present

## 2022-01-07 DIAGNOSIS — E1122 Type 2 diabetes mellitus with diabetic chronic kidney disease: Secondary | ICD-10-CM | POA: Diagnosis not present

## 2022-01-07 DIAGNOSIS — I10 Essential (primary) hypertension: Secondary | ICD-10-CM

## 2022-01-07 LAB — BASIC METABOLIC PANEL
Anion gap: 8 (ref 5–15)
BUN: 40 mg/dL — ABNORMAL HIGH (ref 8–23)
CO2: 20 mmol/L — ABNORMAL LOW (ref 22–32)
Calcium: 8.8 mg/dL — ABNORMAL LOW (ref 8.9–10.3)
Chloride: 110 mmol/L (ref 98–111)
Creatinine, Ser: 2.49 mg/dL — ABNORMAL HIGH (ref 0.61–1.24)
GFR, Estimated: 27 mL/min — ABNORMAL LOW (ref >60)
Glucose, Bld: 115 mg/dL — ABNORMAL HIGH (ref 70–99)
Potassium: 4.7 mmol/L (ref 3.5–5.1)
Sodium: 138 mmol/L (ref 135–145)

## 2022-01-07 LAB — CBC WITH DIFFERENTIAL/PLATELET
Abs Immature Granulocytes: 0.04 10*3/uL (ref 0.00–0.07)
Basophils Absolute: 0.1 10*3/uL (ref 0.0–0.1)
Basophils Relative: 1 %
Eosinophils Absolute: 0.2 10*3/uL (ref 0.0–0.5)
Eosinophils Relative: 2 %
HCT: 37.1 % — ABNORMAL LOW (ref 39.0–52.0)
Hemoglobin: 11.7 g/dL — ABNORMAL LOW (ref 13.0–17.0)
Immature Granulocytes: 1 %
Lymphocytes Relative: 29 %
Lymphs Abs: 2 10*3/uL (ref 0.7–4.0)
MCH: 27 pg (ref 26.0–34.0)
MCHC: 31.5 g/dL (ref 30.0–36.0)
MCV: 85.5 fL (ref 80.0–100.0)
Monocytes Absolute: 0.4 10*3/uL (ref 0.1–1.0)
Monocytes Relative: 6 %
Neutro Abs: 4.3 10*3/uL (ref 1.7–7.7)
Neutrophils Relative %: 61 %
Platelets: 210 10*3/uL (ref 150–400)
RBC: 4.34 MIL/uL (ref 4.22–5.81)
RDW: 14.6 % (ref 11.5–15.5)
WBC: 7 10*3/uL (ref 4.0–10.5)
nRBC: 0 % (ref 0.0–0.2)

## 2022-01-07 NOTE — Telephone Encounter (Signed)
Per [facility], referral declined. Patient did not respond to calls-[name]

## 2022-01-07 NOTE — Progress Notes (Signed)
Patient ID: Larry Callahan, male    DOB: 06/19/1955, 66 y.o.   MRN: [id number]  HPI  Larry Callahan is a 66 yo male with a PMHx of nonischemic cardiomyopathy last EF measured in April 2022 was 30 to 40%, COPD, sleep apnea, chronic kidney disease stage IV, chronic anemia, hypertension, and diabetes mellitus.  Echo from 08/08/21 reviewed and showed and EF of 40-45%. There is mild to moderately decreased function of the left ventricle. The diastolic parameters are indeterminate. The RV systolic function is low normal.   RHC done 03/29/2019 and showed: Pulmonary capillary wedge pressure mean of 8 PA: 32/16 mean 23 RA mean of 10 RV 26/12/13  Cardiac output 8.17 Cardiac index 3.42 Final Conclusions:   Normal right heart pressures  Admitted 01/03/22 due to abdominal pain. Was given morphine with subsequent dizziness. CT abdomen with umbilical hernia with no obstruction and concern of enlarging right renal complex cyst which need further investigation. Surgical consult obtained. Discharged the next day. Admitted 12/17/21 due to acute on chronic heart failure. Discharged after 3 days. Was in the ED 12/06/21 due to needing antibiotic refill as he lost previous prescription. Admitted 11/30/21 due to swelling and pain around the right eye. Ophthamology consult obtained. Started on IV antibiotics due to periorbital cellulitis and then transitioned to oral antibiotics. Discharged after 4 days. Admitted 09/23/21 due to weakness and left-sided chest pain. PT/OT evaluations done. Cardiology consult obtained. Troponins unremarkable. Discharged after 2 days. Was in the ED 09/21/21 due to hand laceration. Sutures placed and he was released. Admitted 09/16/21 due to sudden chest pain. Cardiology consult obtained. NSTEMI ruled out. Furosemide decreased to daily with stoppage of chlorthalidone. Discharged in 2 days. Admitted 2/23-2/26/23 with worsening SOB, and orthopnea, diuresed 4.9 liters and ultimately d/c'd home with  [facility] RN services.   He presents for a follow-up visit with a chief complaint of minimal fatigue with moderate exertion. Describes this as chronic in nature. Has associated cough, shortness of breath, pedal edema and anxiety along with this. He denies any dizziness, difficulty sleeping, abdominal distention, palpitations, chest pain or weight gain.   Reports increased anxiety and stress regarding his medications because "they keep changing them" and he's concerned whether he's taking them correctly or not. He did say that physically he feels well and knows about his upcoming appointments next week.   Past Medical History:  Diagnosis Date   Allergy    Atrial flutter ([place])    a. Dx 12/2020--CHA2DS2VASc = 5-->eliquis.   Chronic combined systolic (congestive) and diastolic (congestive) heart failure ([place])    a. 04/2019 Echo: EF 45-50%; b. 02/2019 Echo: EF 55-60%; c. 09/2020 Echo: EF 35-40%, glob HK. Nl RV size/fxn. Mild MR; d. 07/2021 Echo: EF 40-45%, mild LVH, low-nl RV fxn, no MR.   CKD (chronic kidney disease), stage III (HCC)    COPD (chronic obstructive pulmonary disease) ([place])    Coronary artery disease    a. 2011 Cath: nonobs dzs; b. 09/2020 MV: EF 38%. No signif ischemia. ? inf MI vs diaph attenuation. GI uptake artifact.   Diabetes mellitus without complication (HCC)    GERD (gastroesophageal reflux disease)    History of CVA (cerebrovascular accident) 09/25/2020   Hyperlipidemia    Hypertension    Morbid obesity ([place])    NICM (nonischemic cardiomyopathy) ([place])    a. 04/2019 Echo: EF 45-50%; b. 02/2019 Echo: EF 55-60%; c. 09/2020 Echo: EF 35-40%, glob HK; d. 09/2020 MV: No ischemia. ? inf infarct vs attenuation; d.  07/2021 Echo: EF 40-45%.   OSA (obstructive sleep apnea)    Pneumonia due to COVID-19 virus 01/30/2020   Stage 3b chronic kidney disease ([place])    Syncope and collapse 11/16/2020   Past Surgical History:  Procedure Laterality Date   BACK SURGERY     CARDIAC CATHETERIZATION      CHOLECYSTECTOMY     COLONOSCOPY WITH PROPOFOL N/A 04/12/2019   Procedure: COLONOSCOPY WITH PROPOFOL;  Surgeon: [name], MD;  Location: [facility] ENDOSCOPY;  Service: Gastroenterology;  Laterality: N/A;   ESOPHAGOGASTRODUODENOSCOPY N/A 04/12/2019   Procedure: ESOPHAGOGASTRODUODENOSCOPY (EGD);  Surgeon: [name], MD;  Location: [facility] ENDOSCOPY;  Service: Gastroenterology;  Laterality: N/A;   ESOPHAGOGASTRODUODENOSCOPY (EGD) WITH PROPOFOL N/A 11/21/2019   Procedure: ESOPHAGOGASTRODUODENOSCOPY (EGD) WITH PROPOFOL;  Surgeon: [name], MD;  Location: [facility] ENDOSCOPY;  Service: Gastroenterology;  Laterality: N/A;   FLEXIBLE BRONCHOSCOPY Bilateral 05/17/2019   Procedure: FLEXIBLE BRONCHOSCOPY;  Surgeon: [name], MD;  Location: [name] ORS;  Service: Pulmonary;  Laterality: Bilateral;   left arm surgery     nephrectomy Left    PARATHYROIDECTOMY     RIGHT HEART CATH N/A 03/29/2019   Procedure: RIGHT HEART CATH;  Surgeon: [name], MD;  Location: [place] CV LAB;  Service: Cardiovascular;  Laterality: N/A;   Family History  Problem Relation Age of Onset   Diabetes Mother        2003   Lung cancer Father    Diabetes Sister    Hypertension Sister    Heart disease Sister    Cancer Sister    Bone cancer Brother    Social History   Tobacco Use   Smoking status: Former    Types: Cigarettes   Smokeless tobacco: Former    Types: Chew  Substance Use Topics   Alcohol use: No   Allergies  Allergen Reactions   Penicillins Rash   Prior to Admission medications   Medication Sig Start Date End Date Taking? Authorizing Provider  Accu-Chek Softclix Lancets lancets Use to check blood sugars 4 times a day   E11.65 08/20/21  Yes Abernathy, Alyssa, NP  amLODipine (NORVASC) 10 MG tablet Take 1 tablet by mouth daily. Patient reports not taking 12/30/21  Yes [provider]  apixaban (ELIQUIS) 5 MG TABS tablet Take 1 tablet (5 mg total) by mouth 2 (two)  times daily. 12/20/21 01/19/22 Yes Nolberto Hanlon, MD  Blood Glucose Monitoring Suppl (TRUE METRIX AIR GLUCOSE METER) DEVI 1 Device by Does not apply route 2 (two) times daily. 04/13/18  Yes Boscia, Greer Ee, NP  Budeson-Glycopyrrol-Formoterol (BREZTRI AEROSPHERE) 160-9-4.8 MCG/ACT AERO Inhale into the lungs. Samples given.... 2 puffs in the morning and 2 puffs at bedtime   Yes [provider]  budesonide (PULMICORT) 0.25 MG/2ML nebulizer solution  12/30/21  Yes [provider]  carvedilol (COREG) 12.5 MG tablet Take 1 tablet (12.5 mg total) by mouth 2 (two) times daily. 01/07/21 01/07/22 Yes Arta Silence, MD  cetirizine (ZYRTEC) 10 MG tablet Take 10 mg by mouth daily as needed for allergies. Buys OTC   Yes [provider]  cyanocobalamin 1000 MCG tablet Take 1 tablet (1,000 mcg total) by mouth daily. 12/13/21  Yes Lavera Guise, MD  ENTRESTO 24-26 MG Patient reports not taking 12/30/21  Yes [provider]  furosemide (LASIX) 40 MG tablet Take 1 tablet (40 mg total) by mouth daily.  12/20/21  Yes Nolberto Hanlon, MD  glimepiride (AMARYL) 2 MG tablet Take one tab po bid  with meals 10/11/21  Yes Lavera Guise, MD  glucose blood (ACCU-CHEK GUIDE) test strip Use to check blood sugars 4 times a day   E11.65 12/13/21  Yes Lavera Guise, MD  hydrALAZINE (APRESOLINE) 100 MG tablet Patient reports taking 45m three times a day 12/30/21  Yes [provider]  insulin glargine, 2 Unit Dial, (TOUJEO MAX SOLOSTAR) 300 UNIT/ML Solostar Pen Inject 20 units in am 11/02/21  Yes KLavera Guise MD  ipratropium-albuterol (DUONEB) 0.5-2.5 (3) MG/3ML SOLN Take 3 mLs by nebulization every 6 (six) hours as needed. 06/29/21  Yes Danford, CSuann Larry MD  NOVOFINE PEN NEEDLE 32G X 6 MM MISC Use as directed 12/12/21  Yes KLavera Guise MD  omeprazole (PRILOSEC) 40 MG capsule Take 1 capsule once in the morning and once in the evening 12/13/21  Yes KLavera Guise MD  potassium chloride SA (KLOR-CON M)  10 MEQ tablet Take 1 tablets (10 mEq total) by mouth daily. 12/20/21 01/19/22 Yes ANolberto Hanlon MD  rosuvastatin (CRESTOR) 10 MG tablet Take 1 tablet (10 mg total) by mouth at bedtime. 08/11/21  Yes Abernathy, Alyssa, NP  TOUJEO SOLOSTAR 300 UNIT/ML Solostar Pen Inject into the skin. 11/24/21  Yes [provider]    Review of Systems  Constitutional:  Positive for fatigue. Negative for appetite change.  HENT:  Positive for hearing loss. Negative for congestion and sore throat.   Eyes: Negative.   Respiratory:  Positive for cough and shortness of breath.   Cardiovascular:  Positive for leg swelling. Negative for chest pain and palpitations.  Gastrointestinal:  Negative for abdominal distention and abdominal pain.  Endocrine: Negative.   Genitourinary: Negative.   Musculoskeletal:  Negative for back pain and neck pain.  Skin: Negative.   Allergic/Immunologic: Negative.   Neurological:  Negative for dizziness and light-headedness.  Hematological:  Negative for adenopathy. Does not bruise/bleed easily.  Psychiatric/Behavioral:  Negative for dysphoric mood and sleep disturbance (sleeping on 1 pillow with CPAP). The patient is nervous/anxious.    Vitals:   01/07/22 1037  BP: (!) 174/97  Pulse: 60  Resp: 20  SpO2: 98%  Weight: 251 lb 4 oz (114 kg)  Height: _0  (1.803 m)   Wt Readings from Last 3 Encounters:  01/07/22 251 lb 4 oz (114 kg)  01/04/22 243 lb 2.7 oz (110.3 kg)  12/20/21 249 lb 3.2 oz (113 kg)   Lab Results  Component Value Date   CREATININE 2.49 (H) 01/07/2022   CREATININE 2.02 (H) 01/04/2022   CREATININE 2.30 (H) 01/03/2022   Physical Exam Vitals and nursing note reviewed.  Constitutional:      Appearance: Normal appearance.  HENT:     Head: Normocephalic and atraumatic.  Cardiovascular:     Rate and Rhythm: Regular rhythm. Bradycardia present.  Pulmonary:     Effort: Pulmonary effort is normal. No respiratory distress.     Breath sounds: No wheezing or  rales.  Abdominal:     General: There is no distension.     Palpations: Abdomen is soft.  Musculoskeletal:        General: No tenderness.     Cervical back: Normal range of motion and neck supple.     Right lower leg: Edema (trace pitting) present.     Left lower leg: Edema (trace pitting) present.  Skin:    General: Skin is warm and dry.  Neurological:     General: No focal deficit present.     Mental Status: He  is alert and oriented to person, place, and time.  Psychiatric:        Mood and Affect: Mood is anxious.        Behavior: Behavior normal.    Assessment and Plan:  Chronic Heart failure with reduced ejection fraction- - NYHA II - euvolemic today - weighing daily; reminded to call for an overnight weight gain of > 2 pounds or a weekly weight gain of > 5 pounds - weight down 6 pounds from last visit here 3 weeks ago - not adding salt to his food - participating in paramedicine program  - saw cardiology Rockey Situ) 11/11/20; returns 01/18/22 - on GDMT coreg  - entresto held due to renal function - will check BMP today and see if it can be resumed; put a note on his entresto bottle to NOT take it until further notice - per discharged summary, will check CBC today - BNP 12/17/21 was 122.8  2. HTN with CKD- - BP elevated (174/97) but he's only been taking hydralazine 54m TID (was supposed to be taking 1066m; correction written on his medication bottle - entresto currently on hold - had video visit with PCP (FStephanie Coup5/23/23; returns 01/10/22 - BMP 01/04/22 reviewed and showed Na 141, K 4.1, Cr 2.02, gfr 35 - check BMP today  3. DM-  - A1C 7.0% 08/06/21 - nonfasting glucose at home today was 159  4: Lymphedema- - reports elevating his legs but edema persists - wearing compression socks daily although doesn't have them on today - edema much improved since amlodipine was stopped  5. OSA- - wears CPAP with O2 HS, for "all hours I sleep, including naps"   Medication bottles  reviewed.   Return in 3 weeks, sooner if needed.

## 2022-01-07 NOTE — Telephone Encounter (Signed)
Late entry: call was made at 4:30pm  LM for patient advising him to continue to NOT take his entresto due to renal function. Renal function is worse since hospital discharge. Hemoglobin is stable.   He sees PCP on Monday and they may want to recheck BMP.

## 2022-01-07 NOTE — Patient Instructions (Addendum)
Continue weighing daily and call for an overnight weight gain of 3 pounds or more or a weekly weight gain of more than 5 pounds.   If you have voicemail, please make sure your mailbox is cleaned out so that we may leave a message and please make sure to listen to any voicemails.    Increase your hydralazine to '100mg'$  three times a day. This will be 2 half tablets morning, noon and evening.    Do NOT take anymore entresto until you hear from me

## 2022-01-09 ENCOUNTER — Observation Stay
Admission: EM | Admit: 2022-01-09 | Discharge: 2022-01-12 | Disposition: A | Discharge status: Home Health | Payer: Medicare PPO | Attending: Internal Medicine | Admitting: Internal Medicine

## 2022-01-09 ENCOUNTER — Other Ambulatory Visit: Payer: Self-pay

## 2022-01-09 DIAGNOSIS — I10 Essential (primary) hypertension: Secondary | ICD-10-CM | POA: Diagnosis present

## 2022-01-09 DIAGNOSIS — E1142 Type 2 diabetes mellitus with diabetic polyneuropathy: Secondary | ICD-10-CM | POA: Insufficient documentation

## 2022-01-09 DIAGNOSIS — Z7901 Long term (current) use of anticoagulants: Secondary | ICD-10-CM | POA: Diagnosis not present

## 2022-01-09 DIAGNOSIS — G4733 Obstructive sleep apnea (adult) (pediatric): Secondary | ICD-10-CM | POA: Diagnosis present

## 2022-01-09 DIAGNOSIS — Z79899 Other long term (current) drug therapy: Secondary | ICD-10-CM | POA: Diagnosis not present

## 2022-01-09 DIAGNOSIS — I5022 Chronic systolic (congestive) heart failure: Secondary | ICD-10-CM | POA: Diagnosis not present

## 2022-01-09 DIAGNOSIS — Z794 Long term (current) use of insulin: Secondary | ICD-10-CM | POA: Diagnosis not present

## 2022-01-09 DIAGNOSIS — Z8673 Personal history of transient ischemic attack (TIA), and cerebral infarction without residual deficits: Secondary | ICD-10-CM | POA: Diagnosis not present

## 2022-01-09 DIAGNOSIS — J449 Chronic obstructive pulmonary disease, unspecified: Secondary | ICD-10-CM | POA: Insufficient documentation

## 2022-01-09 DIAGNOSIS — F17219 Nicotine dependence, cigarettes, with unspecified nicotine-induced disorders: Secondary | ICD-10-CM | POA: Diagnosis present

## 2022-01-09 DIAGNOSIS — Z8616 Personal history of COVID-19: Secondary | ICD-10-CM | POA: Diagnosis not present

## 2022-01-09 DIAGNOSIS — E119 Type 2 diabetes mellitus without complications: Secondary | ICD-10-CM | POA: Diagnosis present

## 2022-01-09 DIAGNOSIS — D631 Anemia in chronic kidney disease: Secondary | ICD-10-CM | POA: Insufficient documentation

## 2022-01-09 DIAGNOSIS — E1165 Type 2 diabetes mellitus with hyperglycemia: Secondary | ICD-10-CM | POA: Insufficient documentation

## 2022-01-09 DIAGNOSIS — N184 Chronic kidney disease, stage 4 (severe): Secondary | ICD-10-CM | POA: Diagnosis not present

## 2022-01-09 DIAGNOSIS — Z87891 Personal history of nicotine dependence: Secondary | ICD-10-CM | POA: Insufficient documentation

## 2022-01-09 DIAGNOSIS — I130 Hypertensive heart and chronic kidney disease with heart failure and stage 1 through stage 4 chronic kidney disease, or unspecified chronic kidney disease: Secondary | ICD-10-CM | POA: Diagnosis not present

## 2022-01-09 DIAGNOSIS — I2510 Atherosclerotic heart disease of native coronary artery without angina pectoris: Secondary | ICD-10-CM | POA: Insufficient documentation

## 2022-01-09 DIAGNOSIS — K922 Gastrointestinal hemorrhage, unspecified: Secondary | ICD-10-CM | POA: Insufficient documentation

## 2022-01-09 DIAGNOSIS — K219 Gastro-esophageal reflux disease without esophagitis: Secondary | ICD-10-CM | POA: Insufficient documentation

## 2022-01-09 DIAGNOSIS — K920 Hematemesis: Principal | ICD-10-CM | POA: Insufficient documentation

## 2022-01-09 DIAGNOSIS — E669 Obesity, unspecified: Secondary | ICD-10-CM | POA: Diagnosis present

## 2022-01-09 DIAGNOSIS — E785 Hyperlipidemia, unspecified: Secondary | ICD-10-CM | POA: Diagnosis present

## 2022-01-09 DIAGNOSIS — I4892 Unspecified atrial flutter: Secondary | ICD-10-CM | POA: Diagnosis not present

## 2022-01-09 LAB — COMPREHENSIVE METABOLIC PANEL
ALT: 16 U/L (ref 0–44)
AST: 20 U/L (ref 15–41)
Albumin: 3.2 g/dL — ABNORMAL LOW (ref 3.5–5.0)
Alkaline Phosphatase: 80 U/L (ref 38–126)
Anion gap: 6 (ref 5–15)
BUN: 38 mg/dL — ABNORMAL HIGH (ref 8–23)
CO2: 20 mmol/L — ABNORMAL LOW (ref 22–32)
Calcium: 8.8 mg/dL — ABNORMAL LOW (ref 8.9–10.3)
Chloride: 112 mmol/L — ABNORMAL HIGH (ref 98–111)
Creatinine, Ser: 2.56 mg/dL — ABNORMAL HIGH (ref 0.61–1.24)
GFR, Estimated: 27 mL/min — ABNORMAL LOW (ref >60)
Glucose, Bld: 73 mg/dL (ref 70–99)
Potassium: 4.5 mmol/L (ref 3.5–5.1)
Sodium: 138 mmol/L (ref 135–145)
Total Bilirubin: 0.9 mg/dL (ref 0.3–1.2)
Total Protein: 7.3 g/dL (ref 6.5–8.1)

## 2022-01-09 LAB — URINALYSIS, ROUTINE W REFLEX MICROSCOPIC
Bacteria, UA: NONE SEEN
Bilirubin Urine: NEGATIVE
Glucose, UA: NEGATIVE mg/dL
Hgb urine dipstick: NEGATIVE
Ketones, ur: NEGATIVE mg/dL
Leukocytes,Ua: NEGATIVE
Nitrite: NEGATIVE
Protein, ur: >=300 mg/dL — AB
Specific Gravity, Urine: 1.013 (ref 1.005–1.030)
Squamous Epithelial / HPF: NONE SEEN (ref 0–5)
pH: 5 (ref 5.0–8.0)

## 2022-01-09 LAB — CBC
HCT: 39 % (ref 39.0–52.0)
Hemoglobin: 12.2 g/dL — ABNORMAL LOW (ref 13.0–17.0)
MCH: 26.7 pg (ref 26.0–34.0)
MCHC: 31.3 g/dL (ref 30.0–36.0)
MCV: 85.3 fL (ref 80.0–100.0)
Platelets: 227 10*3/uL (ref 150–400)
RBC: 4.57 MIL/uL (ref 4.22–5.81)
RDW: 14.9 % (ref 11.5–15.5)
WBC: 6.9 10*3/uL (ref 4.0–10.5)
nRBC: 0 % (ref 0.0–0.2)

## 2022-01-09 LAB — CBG MONITORING, ED
Glucose-Capillary: 60 mg/dL — ABNORMAL LOW (ref 70–99)
Glucose-Capillary: 127 mg/dL — ABNORMAL HIGH (ref 70–99)
Glucose-Capillary: 104 mg/dL — ABNORMAL HIGH (ref 70–99)
Glucose-Capillary: 157 mg/dL — ABNORMAL HIGH (ref 70–99)
Glucose-Capillary: 111 mg/dL — ABNORMAL HIGH (ref 70–99)

## 2022-01-09 LAB — LIPASE, BLOOD: Lipase: 40 U/L (ref 11–51)

## 2022-01-09 LAB — TYPE AND SCREEN
ABO/RH(D): A POS
Antibody Screen: NEGATIVE

## 2022-01-09 LAB — GLUCOSE, CAPILLARY: Glucose-Capillary: 110 mg/dL — ABNORMAL HIGH (ref 70–99)

## 2022-01-09 MED ORDER — UMECLIDINIUM-VILANTEROL 62.5-25 MCG/ACT IN AEPB
1.0000 | INHALATION_SPRAY | Freq: Every day | RESPIRATORY_TRACT | Status: DC
  Administered 2022-01-12: 1 via RESPIRATORY_TRACT
  Filled 2022-01-09 (×2): qty 14

## 2022-01-09 MED ORDER — BUDESONIDE 0.25 MG/2ML IN SUSP
0.2500 mg | Freq: Two times a day (BID) | RESPIRATORY_TRACT | Status: DC
  Administered 2022-01-10: 0.25 mg via RESPIRATORY_TRACT

## 2022-01-09 MED ORDER — CARVEDILOL 12.5 MG PO TABS
12.5000 mg | ORAL_TABLET | Freq: Two times a day (BID) | ORAL | Status: DC
  Administered 2022-01-09 – 2022-01-12 (×4): 12.5 mg via ORAL
  Filled 2022-01-09 (×5): qty 1

## 2022-01-09 MED ORDER — BUDESON-GLYCOPYRROL-FORMOTEROL 160-9-4.8 MCG/ACT IN AERO: 2.0000 | INHALATION_SPRAY | Freq: Two times a day (BID) | RESPIRATORY_TRACT | Status: DC

## 2022-01-09 MED ORDER — ROSUVASTATIN CALCIUM 10 MG PO TABS
10.0000 mg | ORAL_TABLET | Freq: Every day | ORAL | Status: DC
  Administered 2022-01-09 – 2022-01-11 (×3): 10 mg via ORAL
  Filled 2022-01-09 (×4): qty 1

## 2022-01-09 MED ORDER — INSULIN ASPART 100 UNIT/ML IJ SOLN
0.0000 [IU] | Freq: Three times a day (TID) | INTRAMUSCULAR | Status: DC
  Administered 2022-01-10 – 2022-01-12 (×2): 2 [IU] via SUBCUTANEOUS
  Filled 2022-01-09 (×2): qty 1

## 2022-01-09 MED ORDER — PANTOPRAZOLE SODIUM 40 MG IV SOLR
40.0000 mg | Freq: Two times a day (BID) | INTRAVENOUS | Status: DC
  Administered 2022-01-09 – 2022-01-12 (×6): 40 mg via INTRAVENOUS
  Filled 2022-01-09 (×6): qty 10

## 2022-01-09 MED ORDER — LORATADINE 10 MG PO TABS
10.0000 mg | ORAL_TABLET | Freq: Every day | ORAL | Status: DC
  Administered 2022-01-10 – 2022-01-12 (×2): 10 mg via ORAL
  Filled 2022-01-09 (×2): qty 1

## 2022-01-09 MED ORDER — DEXTROSE 50 % IV SOLN
INTRAVENOUS | Status: AC
  Administered 2022-01-09: 50 mL via INTRAVENOUS
  Filled 2022-01-09: qty 50

## 2022-01-09 MED ORDER — DEXTROSE 50 % IV SOLN: 1.0000 | Freq: Once | INTRAVENOUS | Status: AC

## 2022-01-09 MED ORDER — HYDRALAZINE HCL 20 MG/ML IJ SOLN
10.0000 mg | INTRAMUSCULAR | Status: DC | PRN
  Administered 2022-01-09: 10 mg via INTRAVENOUS
  Filled 2022-01-09: qty 1

## 2022-01-09 MED ORDER — BUDESONIDE 0.25 MG/2ML IN SUSP
0.2500 mg | Freq: Two times a day (BID) | RESPIRATORY_TRACT | Status: DC
  Administered 2022-01-10 – 2022-01-12 (×4): 0.25 mg via RESPIRATORY_TRACT
  Filled 2022-01-09 (×4): qty 2

## 2022-01-09 MED ORDER — ACETAMINOPHEN 325 MG PO TABS: 650.0000 mg | ORAL_TABLET | Freq: Four times a day (QID) | ORAL | Status: DC | PRN

## 2022-01-09 MED ORDER — IPRATROPIUM-ALBUTEROL 0.5-2.5 (3) MG/3ML IN SOLN: 3.0000 mL | Freq: Four times a day (QID) | RESPIRATORY_TRACT | Status: DC | PRN

## 2022-01-09 MED ORDER — PANTOPRAZOLE SODIUM 40 MG IV SOLR
40.0000 mg | Freq: Once | INTRAVENOUS | Status: AC
Start: 2022-01-09 — End: 2022-01-09
  Administered 2022-01-09: 40 mg via INTRAVENOUS
  Filled 2022-01-09: qty 10

## 2022-01-09 MED ORDER — SENNA 8.6 MG PO TABS
1.0000 | ORAL_TABLET | Freq: Two times a day (BID) | ORAL | Status: DC
Start: 2022-01-09 — End: 2022-01-12
  Filled 2022-01-09 (×3): qty 1

## 2022-01-09 MED ORDER — ACETAMINOPHEN 650 MG RE SUPP: 650.0000 mg | Freq: Four times a day (QID) | RECTAL | Status: DC | PRN

## 2022-01-09 MED ORDER — ONDANSETRON HCL 4 MG/2ML IJ SOLN: 4.0000 mg | Freq: Four times a day (QID) | INTRAMUSCULAR | Status: DC | PRN

## 2022-01-09 MED ORDER — ONDANSETRON HCL 4 MG PO TABS: 4.0000 mg | ORAL_TABLET | Freq: Four times a day (QID) | ORAL | Status: DC | PRN

## 2022-01-09 NOTE — Assessment & Plan Note (Signed)
Home regimen appears to be Coreg, hydralazine. Resume home regimen pending med history. IV hydralazine as needed for now

## 2022-01-09 NOTE — Assessment & Plan Note (Signed)
Nicotine patch ordered.

## 2022-01-09 NOTE — Assessment & Plan Note (Addendum)
Treated with IV PPI every 12 hours given hematemesis.  Resume omeprazole at discharge.

## 2022-01-09 NOTE — Assessment & Plan Note (Addendum)
Creatinine above baseline on admission at 2.56, up from 2.02 at time of discharge on 7/25.  Noted heart failure clinic recently told patient to stop Entresto due to worsening renal function. Suspect progression of CKD -- Renally dose meds as indicated -- Entresto recently stopped per HF clinic note -- Monitor BMP at follow up -- Monitor urine output

## 2022-01-09 NOTE — Assessment & Plan Note (Signed)
Stable, not acutely exacerbated. O2 sats stable on room air. -- Monitor respiratory status -- Continue home regimen pending med history

## 2022-01-09 NOTE — ED Triage Notes (Signed)
Pt to ED via [facility] from home. Pt reports he started vomiting bright red blood at 11am. Pt also reports SOB and RLQ pain. Pt took albuterol tx prior to EMS arrival. VSS with EMS. Pt with hx COPD

## 2022-01-09 NOTE — Assessment & Plan Note (Signed)
Heart rate controlled. -- Hold Eliquis due to hematemesis -- Monitor on telemetry

## 2022-01-09 NOTE — Assessment & Plan Note (Signed)
Continue Crestor pending med history

## 2022-01-09 NOTE — ED Notes (Signed)
EDT at bedside. Pt reports he feels increased weakness and dizziness and feels like his sugar is dropping. EDT took CBG reading 60. MD aware.

## 2022-01-09 NOTE — ED Provider Notes (Signed)
[facility] Provider Note    Event Date/Time   First MD Initiated Contact with Patient 01/09/22 1354     (approximate)   History   Hematemesis   HPI  Larry Callahan is a 66 y.o. male with a history of COPD CHF atrial flutter esophagitis on anticoagulation chronic kidney disease  Today at 63 AM patient suddenly felt nauseated and reports that he vomited and noticed blood streaks in it.  He had some pain in his left upper abdomen which is now subsided.  He felt somewhat hot at the time and a little sweaty.  No shortness of breath.  Does have COPD.  Uses Eliquis.  Currently reports he feels like his blood sugar might be slightly low, as he is feeling a little bit sweaty and would like for [name] to check that.   No chest pain.  Denies any coughed up blood.  Physical Exam   Triage Vital Signs: ED Triage Vitals  Enc Vitals Group     BP 01/09/22 1340 135/82     Pulse Rate 01/09/22 1340 100     Resp 01/09/22 1340 12     Temp 01/09/22 1343 97.8 F (36.6 C)     Temp Source 01/09/22 1343 Oral     SpO2 01/09/22 1340 97 %     Weight 01/09/22 1339 251 lb 1.7 oz (113.9 kg)     Height 01/09/22 1339 '5\' 11"'$  (1.803 m)     Head Circumference --      Peak Flow --      Pain Score 01/09/22 1338 0     Pain Loc --      Pain Edu? --      Excl. in [place]? --     Most recent vital signs: Vitals:   01/09/22 1430 01/09/22 1500  BP: (!) 133/100 (!) 132/113  Pulse: 86 80  Resp: 14 13  Temp:    SpO2: 98% 98%     General: Awake, no distress.  CV:  Good peripheral perfusion.  Resp:  Normal effort.  Clear bilaterally.  No distress.  No coughing noted Abd:  No distention.  Soft easily reducible umbilical hernia.  Abdomen is protuberant and obese but not acutely distended.  Abdomen soft nontender all quadrants at this time.  Reports that he did have left upper quadrant abdominal pain earlier when he threw up, but this seems to have improved Other:  Normal skin  tone Rectal exam performed, normal external exam.  Brown stool, Hemoccult negative.  ED Results / Procedures / Treatments   Labs (all labs ordered are listed, but only abnormal results are displayed) Labs Reviewed  COMPREHENSIVE METABOLIC PANEL - Abnormal; Notable for the following components:      Result Value   Chloride 112 (*)    CO2 20 (*)    BUN 38 (*)    Creatinine, Ser 2.56 (*)    Calcium 8.8 (*)    Albumin 3.2 (*)    GFR, Estimated 27 (*)    All other components within normal limits  CBC - Abnormal; Notable for the following components:   Hemoglobin 12.2 (*)    All other components within normal limits  CBG MONITORING, ED - Abnormal; Notable for the following components:   Glucose-Capillary 60 (*)    All other components within normal limits  CBG MONITORING, ED - Abnormal; Notable for the following components:   Glucose-Capillary 127 (*)    All other components within normal limits  LIPASE, BLOOD  URINALYSIS, ROUTINE W REFLEX MICROSCOPIC  CBG MONITORING, ED  POC OCCULT BLOOD, ED  CBG MONITORING, ED  CBG MONITORING, ED  CBG MONITORING, ED  TYPE AND SCREEN     EKG  Interpreted by me as atrial flutter, heart rate 100 QRS 100 QTc 490 atrial flutter, no evidence of acute ischemia.   RADIOLOGY  Patient's left upper quadrant pain has resolved.  No ongoing abdominal pain at this time.  He reports concerns of hematemesis and denies hemoptysis.  I do not see indication for acute imaging at this time.  If the patient were to have ongoing episodes of hematemesis or return of pain or other concerning symptoms imaging could be considered but at this time he is asymptomatic and given his clinical exam at this time I do not suspect acute perforation or acute abdomen   PROCEDURES:  Critical Care performed: No  Procedures   MEDICATIONS ORDERED IN ED: Medications  acetaminophen (TYLENOL) tablet 650 mg (has no administration in time range)    Or  acetaminophen (TYLENOL)  suppository 650 mg (has no administration in time range)  senna (SENOKOT) tablet 8.6 mg (has no administration in time range)  ondansetron (ZOFRAN) tablet 4 mg (has no administration in time range)    Or  ondansetron (ZOFRAN) injection 4 mg (has no administration in time range)  pantoprazole (PROTONIX) injection 40 mg (has no administration in time range)  dextrose 50 % solution 50 mL (50 mLs Intravenous Given 01/09/22 1441)  pantoprazole (PROTONIX) injection 40 mg (40 mg Intravenous Given 01/09/22 1614)     IMPRESSION / MDM / [place] / ED COURSE  I reviewed the triage vital signs and the nursing notes.                              Differential diagnosis includes, but is not limited to, upper GI bleeding, esophagitis, peptic ulcer disease, AVM, etc.  He denies hemoptysis or respiratory symptoms.  Had associated left upper quadrant pain briefly but this is improved.  Also he did have mild hypoglycemia which she was symptomatic to, 1 amp of D50 given with improvement.  We will continue to monitor his blood glucoses closely.  Given the patient's history of CHF, current laboratory testing, elevated BUN and creatinine, chronic renal disease, and active use of anticoagulant discussed with our hospitalist for consult Dr. Arbutus Ped.  Patient will be admitted to hospitalist service for ongoing observation and treatment/further work-up.  Patient understanding very agreeable with plan for observation for concerns of possible hematemesis  Patient's presentation is most consistent with acute illness / injury with system symptoms.  The patient is on the cardiac monitor to evaluate for evidence of arrhythmia and/or significant heart rate changes.  Labs interpreted as mild anemia, at his baseline.  Chronic renal disease.      FINAL CLINICAL IMPRESSION(S) / ED DIAGNOSES   Final diagnoses:  Hematemesis, unspecified whether nausea present     Rx / DC Orders   ED Discharge Orders      None        Note:  This document was prepared using Dragon voice recognition software and may include unintentional dictation errors.   Delman Kitten, MD 01/09/22 [phone]

## 2022-01-09 NOTE — Assessment & Plan Note (Addendum)
Resolved and Hbg remained stable. Patient reported onset of nausea vomiting this morning around 11 AM.  He noted bright red blood in his vomit.  Denies prior history of same.  Hemoglobin stable.  Hemodynamically stable. -- Admitted for observation -- Trend hemoglobin (stable 11.7 from 12.2) -- Treated IV PPI twice daily -- Resume omeprazole -- EGD today negative

## 2022-01-09 NOTE — Assessment & Plan Note (Signed)
Hemoglobin stable on admission at 12.2, up from 11.7 on 7/28, 10.2 on 7/25. -- Monitor CBC

## 2022-01-09 NOTE — Assessment & Plan Note (Signed)
No acute issues.

## 2022-01-09 NOTE — Assessment & Plan Note (Addendum)
Euvolemic and compensated on admission. Most recent echo 08/08/2021 showed EF 40 to 45%, LV global hypokinesis, indeterminate diastolic parameters -- Monitor volume status closely --Off Entresto due to worsening renal function --Continue Coreg, Lasix, hydralazine

## 2022-01-09 NOTE — Assessment & Plan Note (Signed)
Recent A1c 7.0.  Takes glimepiride and Toujeo at home.  Noted to be hypoglycemic with CBG of 60 in the ED.   -- Stop glimepiride due to worsening renal function and risk of hypoglycemia -- Resume Toujeo at discharge -- PCP follow up --Covered with sliding scale Novolog

## 2022-01-09 NOTE — Assessment & Plan Note (Signed)
Does not tolerate hospital CPAP mask. Nocturnal oxygen supplementation.

## 2022-01-09 NOTE — Assessment & Plan Note (Signed)
Body mass index is 35.02 kg/m. Complicates overall care and prognosis.  Recommend lifestyle modifications including physical activity and diet for weight loss and overall long-term health.

## 2022-01-09 NOTE — H&P (Signed)
History and Physical    Patient: Larry Callahan FSF:[id number] DOB: 08-Jan-1956 DOA: 01/09/2022 DOS: the patient was seen and examined on 01/09/2022 PCP: Lavera Guise, MD  Patient coming from: Home  Chief Complaint:  Chief Complaint  Patient presents with   Hematemesis   HPI: [name] is a 66 y.o. male with medical history significant of a flutter on Eliquis, chronic systolic CHF, CAD, COPD, OSA, GERD, hypertension, type 2 diabetes, CKD stage IV, neuropathy, tobacco dependence, anemia of chronic disease, obesity, stable umbilical hernia who presented to the ED this afternoon due to nausea vomiting and noticing bright red blood in his vomit.  He also reported some mild shortness of breath, generalized weakness dizziness.  His blood glucose was found to be 60 in the ED. patient reported onset at 11 AM this morning of nausea vomiting and upset stomach.  He reports noticing red blood in the vomit and continues to spit some blood up at times.  He reports compliance with his Eliquis.  He otherwise reports feeling overall well.  He denies fever chills, cough congestion sore throat, headache, urinary symptoms, diarrhea, worsening weakness numbness or tingling or any other notable symptoms.  Of note, patient was admitted 7/24-25 for evaluation of abdominal pain.  General surgery was consulted due to the umbilical hernia which remains easily reducible and without complications.  No surgical intervention was recommended and none is indicated at this time.  ED provider offered CT of abdomen pelvis but patient declined stating that he had one recently and does not feel it is needed to repeat.    By chart review, patient has had frequent admissions and ED visits in the past several months.  Reviewed heart failure clinic note from 7/28 when patient was contacted and told to stop taking Entresto due to worsening renal function.  In the ED, patient's vital signs are stable other than mildly  elevated BP.  Labs were notable for creatinine of 2.56 compared to 2.02 on 7/25 when discharged and 2.49 on 7/28 just 2 days ago.  CBC was notable for hemoglobin of 12.2 which is up from 10.2 at time of discharge 7/25.  Patient's admitted to the hospital for observation given hematemesis in the setting of anticoagulation and felt high risk for acute decompensation.  GI consulted for recommendations.   Review of Systems: As mentioned in the history of present illness. All other systems reviewed and are negative.    Past Medical History:  Diagnosis Date   Allergy    Atrial flutter ([place])    a. Dx 12/2020--CHA2DS2VASc = 5-->eliquis.   Chronic combined systolic (congestive) and diastolic (congestive) heart failure ([place])    a. 04/2019 Echo: EF 45-50%; b. 02/2019 Echo: EF 55-60%; c. 09/2020 Echo: EF 35-40%, glob HK. Nl RV size/fxn. Mild MR; d. 07/2021 Echo: EF 40-45%, mild LVH, low-nl RV fxn, no MR.   CKD (chronic kidney disease), stage III (HCC)    COPD (chronic obstructive pulmonary disease) ([place])    Coronary artery disease    a. 2011 Cath: nonobs dzs; b. 09/2020 MV: EF 38%. No signif ischemia. ? inf MI vs diaph attenuation. GI uptake artifact.   Diabetes mellitus without complication ([place])    GERD (gastroesophageal reflux disease)    History of CVA (cerebrovascular accident) 09/25/2020   Hyperlipidemia    Hypertension    Morbid (severe) obesity due to excess calories ([place]) 06/09/2017   Morbid obesity ([place])    NICM (nonischemic cardiomyopathy) ([place])    a. 04/2019  Echo: EF 45-50%; b. 02/2019 Echo: EF 55-60%; c. 09/2020 Echo: EF 35-40%, glob HK; d. 09/2020 MV: No ischemia. ? inf infarct vs attenuation; d. 07/2021 Echo: EF 40-45%.   OSA (obstructive sleep apnea)    Pneumonia due to COVID-19 virus 01/30/2020   Stage 3b chronic kidney disease ([place])    Syncope and collapse 11/16/2020   Past Surgical History:  Procedure Laterality Date   BACK SURGERY     CARDIAC CATHETERIZATION     CHOLECYSTECTOMY      COLONOSCOPY WITH PROPOFOL N/A 04/12/2019   Procedure: COLONOSCOPY WITH PROPOFOL;  Surgeon: [name], MD;  Location: [facility] ENDOSCOPY;  Service: Gastroenterology;  Laterality: N/A;   ESOPHAGOGASTRODUODENOSCOPY N/A 04/12/2019   Procedure: ESOPHAGOGASTRODUODENOSCOPY (EGD);  Surgeon: [name], MD;  Location: [facility] ENDOSCOPY;  Service: Gastroenterology;  Laterality: N/A;   ESOPHAGOGASTRODUODENOSCOPY (EGD) WITH PROPOFOL N/A 11/21/2019   Procedure: ESOPHAGOGASTRODUODENOSCOPY (EGD) WITH PROPOFOL;  Surgeon: [name], MD;  Location: [facility] ENDOSCOPY;  Service: Gastroenterology;  Laterality: N/A;   FLEXIBLE BRONCHOSCOPY Bilateral 05/17/2019   Procedure: FLEXIBLE BRONCHOSCOPY;  Surgeon: [name], MD;  Location: [name] ORS;  Service: Pulmonary;  Laterality: Bilateral;   left arm surgery     nephrectomy Left    PARATHYROIDECTOMY     RIGHT HEART CATH N/A 03/29/2019   Procedure: RIGHT HEART CATH;  Surgeon: [name], MD;  Location: [facility] CV LAB;  Service: Cardiovascular;  Laterality: N/A;   Social History:  reports that he has quit smoking. His smoking use included cigarettes. He has quit using smokeless tobacco.  His smokeless tobacco use included chew. He reports that he does not drink alcohol and does not use drugs.  Allergies  Allergen Reactions   Penicillins Rash    Family History  Problem Relation Age of Onset   Diabetes Mother        2003   Lung cancer Father    Diabetes Sister    Hypertension Sister    Heart disease Sister    Cancer Sister    Bone cancer Brother     Prior to Admission medications   Medication Sig Start Date End Date Taking? Authorizing Provider  Accu-Chek Softclix Lancets lancets Use to check blood sugars 4 times a day   E11.65 08/20/21   [name], NP  apixaban (ELIQUIS) 5 MG TABS tablet Take 1 tablet (5 mg total) by mouth 2 (two) times daily. 12/20/21 01/19/22  Nolberto Hanlon, MD  Blood Glucose Monitoring Suppl (TRUE  METRIX AIR GLUCOSE METER) DEVI 1 Device by Does not apply route 2 (two) times daily. 04/13/18   [name], NP  Budeson-Glycopyrrol-Formoterol (BREZTRI AEROSPHERE) 160-9-4.8 MCG/ACT AERO Inhale into the lungs. Samples given.... 2 puffs in the morning and 2 puffs at bedtime    [provider]  budesonide (PULMICORT) 0.25 MG/2ML nebulizer solution  12/30/21   [provider]  carvedilol (COREG) 12.5 MG tablet Take 1 tablet (12.5 mg total) by mouth 2 (two) times daily. 01/07/21 01/07/22  Arta Silence, MD  cetirizine (ZYRTEC) 10 MG tablet Take 10 mg by mouth daily as needed for allergies. Buys OTC    [provider]  cyanocobalamin 1000 MCG tablet Take 1 tablet (1,000 mcg total) by mouth daily. 12/13/21   Lavera Guise, MD  ENTRESTO 24-26 MG  12/30/21   [provider]  furosemide (LASIX) 40 MG tablet Take 1 tablet (40 mg total) by mouth daily. Take lasix 46m po daily 12/20/21   ANolberto Hanlon MD  glimepiride (AMARYL) 2 MG tablet Take one tab po bid with meals 10/11/21   Lavera Guise, MD  glucose blood (ACCU-CHEK GUIDE) test strip Use to check blood sugars 4 times a day   E11.65 12/13/21   Lavera Guise, MD  hydrALAZINE (APRESOLINE) 100 MG tablet Take 100 mg by mouth 3 (three) times daily. 12/30/21   [provider]  insulin glargine, 2 Unit Dial, (TOUJEO MAX SOLOSTAR) 300 UNIT/ML Solostar Pen Inject 20 units in am 11/02/21   Lavera Guise, MD  ipratropium-albuterol (DUONEB) 0.5-2.5 (3) MG/3ML SOLN Take 3 mLs by nebulization every 6 (six) hours as needed. 06/29/21   Danford, Suann Larry, MD  NOVOFINE PEN NEEDLE 32G X 6 MM MISC Use as directed 12/12/21   Lavera Guise, MD  omeprazole (PRILOSEC) 40 MG capsule Take 1 capsule once in the morning and once in the evening 12/13/21   Lavera Guise, MD  potassium chloride (KLOR-CON M) 10 MEQ tablet Take 10 mEq by mouth daily.    [provider]  rosuvastatin (CRESTOR) 10 MG tablet Take 1 tablet (10 mg total) by  mouth at bedtime. 08/11/21   [name], NP  TOUJEO SOLOSTAR 300 UNIT/ML Solostar Pen Inject into the skin. 11/24/21   [provider]    Physical Exam: Vitals:   01/09/22 1400 01/09/22 1430 01/09/22 1500 01/09/22 1630  BP: 123/82 (!) 133/100 (!) 132/113 (!) 157/99  Pulse: 95 86 80 70  Resp: (!) _0 Temp:      TempSrc:      SpO2: 96% 98% 98% 98%  Weight:      Height:       General exam: awake, alert, no acute distress, chronically ill-appearing HEENT: atraumatic, clear conjunctiva, anicteric sclera, moist mucus membranes, hearing grossly normal  Respiratory system: CTAB, no wheezes, rales or rhonchi, normal respiratory effort at rest, on room air. Cardiovascular system: normal S1/S2, RRR, no JVD, murmurs, rubs, gallops, no peripheral edema.   Gastrointestinal system: soft, NT, ND, no HSM felt, +bowel sounds.  Umbilical hernia is small and easily reducible. Central nervous system: A&O x3. no gross focal neurologic deficits, normal speech Extremities: moves all, no edema, no cyanosis, normal tone Skin: dry, intact, normal temperature Psychiatry: normal mood, congruent affect, judgement and insight appear normal  Data Reviewed:  Notable labs as discussed above in HPI  Assessment and Plan: * Hematemesis Patient reported onset of nausea vomiting this morning around 11 AM.  He noted bright red blood in his vomit.  Denies prior history of same.  Hemoglobin stable.  Hemodynamically stable. -- Admit for observation -- Trend hemoglobin -- IV PPI twice daily -- Clear liquid diet for now -- Hold off IV fluids due to systolic CHF -- GI consulted, appreciate recommendations -- Monitor closely  Atrial flutter ([place]) Heart rate controlled. -- Hold Eliquis due to hematemesis -- Monitor on telemetry  Chronic systolic CHF (congestive heart failure) ([place]) Euvolemic and compensated on admission. Most recent echo 08/08/2021 showed EF 40 to 45%, LV global hypokinesis,  indeterminate diastolic parameters -- Monitor volume status closely  COPD (chronic obstructive pulmonary disease) (HCC) Stable, not acutely exacerbated. -- Monitor respiratory status -- Continue home regimen pending med history -- Supplement O2 if needed to maintain sat above 88%  Essential hypertension Home regimen appears to be Coreg, hydralazine. Resume home regimen pending med history. IV hydralazine as needed for now  Obstructive sleep apnea, adult Does not tolerate hospital CPAP mask. Nocturnal oxygen supplementation.  Gastroesophageal reflux disease without esophagitis On IV PPI every 12 hours given hematemesis  Anemia in chronic kidney disease (CKD) Hemoglobin stable on admission at 12.2, up from 11.7 on 7/28, 10.2 on 7/25. -- Monitor CBC  HLD (hyperlipidemia) Continue Crestor pending med history  Obesity (BMI 30-39.9) Body mass index is 35.02 kg/m. Complicates overall care and prognosis.  Recommend lifestyle modifications including physical activity and diet for weight loss and overall long-term health.   CKD (chronic kidney disease), stage IV (HCC) Creatinine above baseline on admission at 2.56, up from 2.02 at time of discharge on 7/25.  Noted heart failure clinic recently told patient to stop Entresto due to worsening renal function. -- Hold Entresto and other nephrotoxins -- Renally dose meds as indicated -- Monitor BMP -- Nephrology consult if worsening -- Monitor urine output  Uncontrolled type 2 diabetes mellitus with hyperglycemia (HCC) Recent A1c 7.0.  Takes glimepiride and Toujeo at home.  Noted to be hypoglycemic with CBG of 60 in the ED.   -- Hold home regimen -- Sensitive sliding scale NovoLog -- Hypoglycemia protocol  Diabetic polyneuropathy associated with type 2 diabetes mellitus ([place]) Continue home meds pending med history  Cigarette nicotine dependence with nicotine-induced disorder Nicotine patch ordered      Advance Care Planning:  Full code  Consults: [place] neurology  Family Communication: None  Severity of Illness: The appropriate patient status for this patient is OBSERVATION. Observation status is judged to be reasonable and necessary in order to provide the required intensity of service to ensure the patient's safety. The patient's presenting symptoms, physical exam findings, and initial radiographic and laboratory data in the context of their medical condition is felt to place them at decreased risk for further clinical deterioration. Furthermore, it is anticipated that the patient will be medically stable for discharge from the hospital within 2 midnights of admission.   Author: Ezekiel Slocumb, DO 01/09/2022 4:47 PM  For on call review [email or web address].

## 2022-01-10 ENCOUNTER — Inpatient Hospital Stay: Payer: Medicare PPO | Admitting: Nurse Practitioner

## 2022-01-10 DIAGNOSIS — K922 Gastrointestinal hemorrhage, unspecified: Secondary | ICD-10-CM

## 2022-01-10 DIAGNOSIS — K920 Hematemesis: Secondary | ICD-10-CM | POA: Diagnosis not present

## 2022-01-10 LAB — BASIC METABOLIC PANEL
Anion gap: 7 (ref 5–15)
BUN: 37 mg/dL — ABNORMAL HIGH (ref 8–23)
CO2: 21 mmol/L — ABNORMAL LOW (ref 22–32)
Calcium: 8.8 mg/dL — ABNORMAL LOW (ref 8.9–10.3)
Chloride: 112 mmol/L — ABNORMAL HIGH (ref 98–111)
Creatinine, Ser: 2.37 mg/dL — ABNORMAL HIGH (ref 0.61–1.24)
GFR, Estimated: 29 mL/min — ABNORMAL LOW (ref >60)
Glucose, Bld: 103 mg/dL — ABNORMAL HIGH (ref 70–99)
Potassium: 4.2 mmol/L (ref 3.5–5.1)
Sodium: 140 mmol/L (ref 135–145)

## 2022-01-10 LAB — CBC
HCT: 36.2 % — ABNORMAL LOW (ref 39.0–52.0)
Hemoglobin: 11.7 g/dL — ABNORMAL LOW (ref 13.0–17.0)
MCH: 27.7 pg (ref 26.0–34.0)
MCHC: 32.3 g/dL (ref 30.0–36.0)
MCV: 85.6 fL (ref 80.0–100.0)
Platelets: 179 10*3/uL (ref 150–400)
RBC: 4.23 MIL/uL (ref 4.22–5.81)
RDW: 14.8 % (ref 11.5–15.5)
WBC: 6.3 10*3/uL (ref 4.0–10.5)
nRBC: 0 % (ref 0.0–0.2)

## 2022-01-10 LAB — GLUCOSE, CAPILLARY
Glucose-Capillary: 116 mg/dL — ABNORMAL HIGH (ref 70–99)
Glucose-Capillary: 182 mg/dL — ABNORMAL HIGH (ref 70–99)
Glucose-Capillary: 86 mg/dL (ref 70–99)
Glucose-Capillary: 106 mg/dL — ABNORMAL HIGH (ref 70–99)

## 2022-01-10 NOTE — Progress Notes (Signed)
       CROSS COVER NOTE  NAME: Larry Callahan MRN: [id number] DOB : 09-03-1955    Date of Service   01/10/2022  HPI/Events of Note   Secure chat received from nursing reporting [name] was admitted as Telemetry Medical and does not have orders for telemetry. [name] presented with hematemesis and has a PMH of Aflutter, CHF, CAD, COPD, OSA, GERD, HTN, DM-2, CKD-IV, neuropathy, tobacco dependence, anemia of chronic disease, obesity, and umbilical hernia.  Interventions   Plan: Telemetry ordered for 24 hrs      This document was prepared using Dragon voice recognition software and may include unintentional dictation errors.  Neomia Glass DNP, MHA, FNP-BC Nurse Practitioner [facility] Pager [phone]

## 2022-01-10 NOTE — Hospital Course (Signed)
HPI on admission: Larry Callahan is a 66 y.o. male with medical history significant of a flutter on Eliquis, chronic systolic CHF, CAD, COPD, OSA, GERD, hypertension, type 2 diabetes, CKD stage IV, neuropathy, tobacco dependence, anemia of chronic disease, obesity, stable umbilical hernia who presented to the ED this afternoon due to nausea vomiting and noticing bright red blood in his vomit.  He also reported some mild shortness of breath, generalized weakness dizziness.  His blood glucose was found to be 60 in the ED. patient reported onset at 11 AM this morning of nausea vomiting and upset stomach.  He reports noticing red blood in the vomit and continues to spit some blood up at times.  He reports compliance with his Eliquis.  He otherwise reports feeling overall well.  He denies fever chills, cough congestion sore throat, headache, urinary symptoms, diarrhea, worsening weakness numbness or tingling or any other notable symptoms.   Of note, patient was admitted 7/24-25 for evaluation of abdominal pain.  General surgery was consulted due to the umbilical hernia which remains easily reducible and without complications.  No surgical intervention was recommended and none is indicated at this time.  ED provider offered CT of abdomen pelvis but patient declined stating that he had one recently and does not feel it is needed to repeat.     By chart review, patient has had frequent admissions and ED visits in the past several months.  Reviewed heart failure clinic note from 7/28 when patient was contacted and told to stop taking Entresto due to worsening renal function.   In the ED, patient's vital signs are stable other than mildly elevated BP.  Labs were notable for creatinine of 2.56 compared to 2.02 on 7/25 when discharged and 2.49 on 7/28 just 2 days ago.  CBC was notable for hemoglobin of 12.2 which is up from 10.2 at time of discharge 7/25.   Patient's admitted to the hospital for observation  given hematemesis in the setting of anticoagulation and felt high risk for acute decompensation.  GI consulted for recommendations.   7/31: awaiting GI consult & recommendations 8/1: GI plans for EGD later today. Pt is NPO awaiting evaluation.  Patient is otherwise clinically improved, hemoglobin is stable. No reports of further hematemesis.  Stable for discharge home this evening after EGD, if cleared by GI.

## 2022-01-10 NOTE — TOC Initial Note (Signed)
Transition of Care [facility]) - Initial/Assessment Note    Patient Details  Name: Larry Callahan MRN: [id number] Date of Birth: [birth year]  [facility] CM/SW Contact:    Beverly Sessions, RN Phone Number: 01/10/2022, 11:36 AM  Clinical Narrative:                  Patient was assessed on previous admission by [place] 12/20/21 see note from that admission below " Patient lives at home alone, has home health nurse from [facility] visit him.  Spoke to [place] at [employer], states that patient can return to their services.     Patient has walker at home, states he is able to care for himself, and even mows the grass at home.  Patient uses [employer] to transport to appointments, states home health nurse assists him with prescriptions, and he is able to take medications with the help of a pill box.   Patient states his sister can help on occasion, but she is 34 years of age and does not drive.  Patient will require a ride home, taxi contacted.   [facility] has also been working with patient, they have been notified of patient's discharge today.           "  Spoke with patient today.  He states he is will active with [facility].  Notified Kelsey with [facility] of admission. She confirms patient is active with RN.  Patient states a "buddy" will provide transportation at discharge  Expected Discharge Plan: [place] Barriers to Discharge: Continued Medical Work up   Patient Goals and CMS Choice        Expected Discharge Plan and Services Expected Discharge Plan: [place]                                              Prior Living Arrangements/Services   Lives with:: Self                   Activities of Daily Living Home Assistive Devices/Equipment: CPAP, Cane (specify quad or straight) ADL Screening (condition at time of admission) Patient's cognitive ability adequate to safely complete daily activities?: Yes Is the  patient deaf or have difficulty hearing?: No Does the patient have difficulty seeing, even when wearing glasses/contacts?: No Does the patient have difficulty concentrating, remembering, or making decisions?: No Patient able to express need for assistance with ADLs?: Yes Does the patient have difficulty dressing or bathing?: No Independently performs ADLs?: Yes (appropriate for developmental age) Does the patient have difficulty walking or climbing stairs?: No Weakness of Legs: None Weakness of Arms/Hands: None  Permission Sought/Granted                  Emotional Assessment              Admission diagnosis:  Hematemesis [K92.0] Hematemesis, unspecified whether nausea present [K92.0] Patient Active Problem List   Diagnosis Date Noted   Dizziness and giddiness 41/96/2229   Umbilical hernia 79/89/2119   Acute on chronic systolic CHF (congestive heart failure) ([place]) 08/05/2021   Acquired thrombophilia ([place]) 06/28/2021   Anemia in chronic kidney disease (CKD) 06/28/2021   PAD (peripheral artery disease) ([place])    Cellulitis of right arm 03/02/2021   Atrial flutter ([place]) 01/07/2021   Demand ischemia ([place])    Influenza  A 10/30/2020   HLD (hyperlipidemia) 10/06/2020   Gastroesophageal reflux disease without esophagitis 04/19/2020   Elevated troponin I level 01/29/2020   Hematemesis 01/09/2020   Hospital discharge follow-up 11/26/2019   Obesity (BMI 30-39.9) 08/08/2019   Acute right hip pain 06/17/2019   Inability to ambulate due to hip 06/17/2019   CKD (chronic kidney disease), stage IV (HCC)    Hypervolemia    Full code status 05/20/2019   Pleuritic chest pain    Acute kidney injury superimposed on CKD 3b ([place]) 05/09/2019   Acute on chronic heart failure with preserved ejection fraction (HFpEF) (HCC)    Chronic systolic CHF (congestive heart failure) ([place]) 03/01/2019   Iron deficiency anemia 12/31/2018   Atopic dermatitis 11/24/2018   Screening for colon cancer  11/06/2017   Uncontrolled type 2 diabetes mellitus with hyperglycemia ([place]) 11/06/2017   Hidradenitis 06/09/2017   Atherosclerotic heart disease of native coronary artery without angina pectoris 06/09/2017   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   Obstructive sleep apnea, adult 06/09/2017   Cigarette nicotine dependence with nicotine-induced disorder 06/09/2017   Diabetic polyneuropathy associated with type 2 diabetes mellitus ([place]) 06/09/2017   Allergic rhinitis due to pollen 06/09/2017   Mixed hyperlipidemia 06/09/2017   COPD (chronic obstructive pulmonary disease) ([place]) 06/09/2017   Wheezing 06/09/2017   Dysuria 06/09/2017   Essential hypertension 06/09/2017   Personal history of other malignant neoplasm of kidney 06/09/2017   Pain in right hip 06/09/2017   Secondary hyperparathyroidism, not elsewhere classified ([place]) 06/09/2017   Tinea corporis 06/09/2017   PCP:  Lavera Guise, MD Pharmacy:   [facility] [address] [place] [address] Phone: [phone] Fax: [phone]     Social Determinants of Health (SDOH) Interventions    Readmission Risk Interventions    12/01/2021   12:25 PM 08/07/2021    2:36 PM 06/28/2021    2:26 PM  Readmission Risk Prevention Plan  Transportation Screening Complete Complete Complete  Medication Review Press photographer) Complete Complete Complete  PCP or Specialist appointment within 3-5 days of discharge Complete Complete Complete  HRI or Home Care Consult Complete Complete Complete  SW Recovery Care/Counseling Consult Complete Complete Complete  Palliative Care Screening Complete Complete Not Applicable  Skilled Nursing Facility Complete Complete Not Applicable

## 2022-01-10 NOTE — Care Management Obs Status (Signed)
[place] NOTIFICATION   Patient Details  Name: Larry Callahan MRN: [id number] Date of Birth: [birth year]   Medicare Observation Status Notification Given:  Yes    Beverly Sessions, RN 01/10/2022, 11:46 AM

## 2022-01-10 NOTE — Consult Note (Signed)
Larry Lame, MD [facility]  [address] [place], [place] [address] Phone: [phone] Fax : [phone]  Consultation  Referring Provider:     Dr. Arbutus Ped Primary Care Physician:  Lavera Guise, MD Primary Gastroenterologist:  Dr. Marius Ditch         Reason for Consultation:     Spitting up blood  Date of Admission:  01/09/2022 Date of Consultation:  01/10/2022         HPI:   Larry Callahan is a 66 y.o. male is on Eliquis for atrial flutter and has a history of coronary artery disease diabetes chronic kidney disease CHF COPD and GERD.  The patient had reported nausea with vomiting and noticed bright red blood in his vomitus.  The patient had a similar episode back in 2021 and underwent an upper endoscopy by Dr. Marius Ditch.  At that time the upper endoscopy was negative for any abnormalities.  The patient's hemoglobin trend from admission and prior to admission showed:  Component     Latest Ref Rng 09/25/2021 11/30/2021 12/01/2021 12/17/2021 01/03/2022  Hemoglobin     13.0 - 17.0 g/dL 9.8 (L)  12.6 (L)  11.9 (L)  10.8 (L)  11.3 (L)   HCT     39.0 - 52.0 % 31.7 (L)  40.2  37.3 (L)  34.0 (L)  35.5 (L)    Component     Latest Ref Rng 01/04/2022 01/07/2022 01/09/2022 01/10/2022  Hemoglobin     13.0 - 17.0 g/dL 10.2 (L)  11.7 (L)  12.2 (L)  11.7 (L)   HCT     39.0 - 52.0 % 33.3 (L)  37.1 (L)  39.0  36.2 (L)    The patient had been to the ER earlier in the month for abdominal pain and was found to have a reducible umbilical hernia and no surgical intervention was recommended.  Due to the frequent admissions and the hematemesis the patient was admitted for observation and a GI consult was called.  The patient reports that he did have some spitting up of blood today.  There is no report of any unexplained weight loss or change in bowel habits.  Past Medical History:  Diagnosis Date   Allergy    Atrial flutter ([place])    a. Dx 12/2020--CHA2DS2VASc = 5-->eliquis.   Chronic combined systolic  (congestive) and diastolic (congestive) heart failure ([place])    a. 04/2019 Echo: EF 45-50%; b. 02/2019 Echo: EF 55-60%; c. 09/2020 Echo: EF 35-40%, glob HK. Nl RV size/fxn. Mild MR; d. 07/2021 Echo: EF 40-45%, mild LVH, low-nl RV fxn, no MR.   CKD (chronic kidney disease), stage III (HCC)    COPD (chronic obstructive pulmonary disease) ([place])    Coronary artery disease    a. 2011 Cath: nonobs dzs; b. 09/2020 MV: EF 38%. No signif ischemia. ? inf MI vs diaph attenuation. GI uptake artifact.   Diabetes mellitus without complication (HCC)    GERD (gastroesophageal reflux disease)    History of CVA (cerebrovascular accident) 09/25/2020   Hyperlipidemia    Hypertension    Morbid (severe) obesity due to excess calories ([place]) 06/09/2017   Morbid obesity ([place])    NICM (nonischemic cardiomyopathy) ([place])    a. 04/2019 Echo: EF 45-50%; b. 02/2019 Echo: EF 55-60%; c. 09/2020 Echo: EF 35-40%, glob HK; d. 09/2020 MV: No ischemia. ? inf infarct vs attenuation; d. 07/2021 Echo: EF 40-45%.   OSA (obstructive sleep apnea)    Pneumonia due to COVID-19 virus 01/30/2020  Stage 3b chronic kidney disease ([place])    Syncope and collapse 11/16/2020    Past Surgical History:  Procedure Laterality Date   BACK SURGERY     CARDIAC CATHETERIZATION     CHOLECYSTECTOMY     COLONOSCOPY WITH PROPOFOL N/A 04/12/2019   Procedure: COLONOSCOPY WITH PROPOFOL;  Surgeon: [name], MD;  Location: [facility] ENDOSCOPY;  Service: Gastroenterology;  Laterality: N/A;   ESOPHAGOGASTRODUODENOSCOPY N/A 04/12/2019   Procedure: ESOPHAGOGASTRODUODENOSCOPY (EGD);  Surgeon: [name], MD;  Location: [facility] ENDOSCOPY;  Service: Gastroenterology;  Laterality: N/A;   ESOPHAGOGASTRODUODENOSCOPY (EGD) WITH PROPOFOL N/A 11/21/2019   Procedure: ESOPHAGOGASTRODUODENOSCOPY (EGD) WITH PROPOFOL;  Surgeon: [name], MD;  Location: [facility] ENDOSCOPY;  Service: Gastroenterology;  Laterality: N/A;   FLEXIBLE BRONCHOSCOPY Bilateral 05/17/2019    Procedure: FLEXIBLE BRONCHOSCOPY;  Surgeon: [name], MD;  Location: [name] ORS;  Service: Pulmonary;  Laterality: Bilateral;   left arm surgery     nephrectomy Left    PARATHYROIDECTOMY     RIGHT HEART CATH N/A 03/29/2019   Procedure: RIGHT HEART CATH;  Surgeon: [name], MD;  Location: [facility] CV LAB;  Service: Cardiovascular;  Laterality: N/A;    Prior to Admission medications   Medication Sig Start Date End Date Taking? Authorizing Provider  apixaban (ELIQUIS) 5 MG TABS tablet Take 1 tablet (5 mg total) by mouth 2 (two) times daily. 12/20/21 01/19/22 Yes Nolberto Hanlon, MD  Budeson-Glycopyrrol-Formoterol (BREZTRI AEROSPHERE) 160-9-4.8 MCG/ACT AERO Inhale into the lungs. Samples given.... 2 puffs in the morning and 2 puffs at bedtime   Yes [provider]  budesonide (PULMICORT) 0.25 MG/2ML nebulizer solution Take 0.25 mg by nebulization daily. 12/30/21  Yes [provider]  carvedilol (COREG) 12.5 MG tablet Take 1 tablet (12.5 mg total) by mouth 2 (two) times daily. 01/07/21 01/09/22 Yes Arta Silence, MD  cyanocobalamin 1000 MCG tablet Take 1 tablet (1,000 mcg total) by mouth daily. 12/13/21  Yes Lavera Guise, MD  furosemide (LASIX) 40 MG tablet Take 1 tablet (40 mg total) by mouth daily. Take lasix 9m po daily 12/20/21  Yes Amery, SGwynneth Albright MD  glimepiride (AMARYL) 2 MG tablet Take one tab po bid with meals 10/11/21  Yes KLavera Guise MD  hydrALAZINE (APRESOLINE) 100 MG tablet Take 100 mg by mouth 3 (three) times daily. 12/30/21  Yes [provider]  insulin glargine, 2 Unit Dial, (TOUJEO MAX SOLOSTAR) 300 UNIT/ML Solostar Pen Inject 20 units in am 11/02/21  Yes KLavera Guise MD  omeprazole (PRILOSEC) 40 MG capsule Take 1 capsule once in the morning and once in the evening 12/13/21  Yes KLavera Guise MD  potassium chloride (KLOR-CON M) 10 MEQ tablet Take 10 mEq by mouth daily.   Yes [provider]  rosuvastatin (CRESTOR) 10 MG tablet Take 1  tablet (10 mg total) by mouth at bedtime. 08/11/21  Yes Abernathy, Alyssa, NP  TOUJEO SOLOSTAR 300 UNIT/ML Solostar Pen Inject 10 Units into the skin daily. 11/24/21  Yes [provider]  Accu-Chek Softclix Lancets lancets Use to check blood sugars 4 times a day   E11.65 08/20/21   AJonetta Osgood NP  Blood Glucose Monitoring Suppl (TRUE METRIX AIR GLUCOSE METER) DEVI 1 Device by Does not apply route 2 (two) times daily. 04/13/18   BRonnell Freshwater NP  cetirizine (ZYRTEC) 10 MG tablet Take 10 mg by mouth daily as needed for allergies. Buys OTC    [provider]  ENTRESTO 24-26 MG  12/30/21  [provider]  glucose blood (ACCU-CHEK GUIDE) test strip Use to check blood sugars 4 times a day   E11.65 12/13/21   Lavera Guise, MD  ipratropium-albuterol (DUONEB) 0.5-2.5 (3) MG/3ML SOLN Take 3 mLs by nebulization every 6 (six) hours as needed. 06/29/21   Danford, Suann Larry, MD  NOVOFINE PEN NEEDLE 32G X 6 MM MISC Use as directed 12/12/21   Lavera Guise, MD    Family History  Problem Relation Age of Onset   Diabetes Mother        2003   Lung cancer Father    Diabetes Sister    Hypertension Sister    Heart disease Sister    Cancer Sister    Bone cancer Brother      Social History   Tobacco Use   Smoking status: Former    Types: Cigarettes   Smokeless tobacco: Former    Types: Nurse, children's Use: Never used  Substance Use Topics   Alcohol use: No   Drug use: No    Allergies as of 01/09/2022 - Review Complete 01/09/2022  Allergen Reaction Noted   Penicillins Rash 07/20/2017    Review of Systems:    All systems reviewed and negative except where noted in HPI.   Physical Exam:  Vital signs in last 24 hours: Temp:  [97.8 F (36.6 C)-99.4 F (37.4 C)] 98.4 F (36.9 C) (07/31 0752) Pulse Rate:  [63-100] 70 (07/31 0752) Resp:  [11-21] 16 (07/31 0752) BP: (117-188)/(59-113) 149/69 (07/31 0752) SpO2:  [96 %-100 %] 98 % (07/31 0752) Weight:   [109 kg-113.9 kg] 109 kg (07/31 0456) Last BM Date : 01/09/22 General:   Pleasant, cooperative in NAD Head:  Normocephalic and atraumatic. Eyes:   No icterus.   Conjunctiva pink. PERRLA. Ears:  Normal auditory acuity. Neck:  Supple; no masses or thyroidomegaly Lungs: Respirations even and unlabored. Lungs clear to auscultation bilaterally.   No wheezes, crackles, or rhonchi.  Heart:  Regular rate and rhythm;  Without murmur, clicks, rubs or gallops Abdomen:  Soft, nondistended, nontender. Normal bowel sounds. No appreciable masses or hepatomegaly.  No rebound or guarding.  Rectal:  Not performed. Msk:  Symmetrical without gross deformities.    Extremities:  Without edema, cyanosis or clubbing. Neurologic:  Alert and oriented x3;  grossly normal neurologically. Skin:  Intact without significant lesions or rashes. Cervical Nodes:  No significant cervical adenopathy. Psych:  Alert and cooperative. Normal affect.  LAB RESULTS: Recent Labs    01/09/22 1341 01/10/22 0537  WBC 6.9 6.3  HGB 12.2* 11.7*  HCT 39.0 36.2*  PLT 227 179   BMET Recent Labs    01/09/22 1341 01/10/22 0537  NA 138 140  K 4.5 4.2  CL 112* 112*  CO2 20* 21*  GLUCOSE 73 103*  BUN 38* 37*  CREATININE 2.56* 2.37*  CALCIUM 8.8* 8.8*   LFT Recent Labs    01/09/22 1341  PROT 7.3  ALBUMIN 3.2*  AST 20  ALT 16  ALKPHOS 80  BILITOT 0.9   PT/INR No results for input(s): "LABPROT", "INR" in the last 72 hours.  STUDIES: No results found.    Impression / Plan:   Assessment: Principal Problem:   Hematemesis Active Problems:   Obstructive sleep apnea, adult   Cigarette nicotine dependence with nicotine-induced disorder   Diabetic polyneuropathy associated with type 2 diabetes mellitus (HCC)   COPD (chronic obstructive pulmonary disease) (HCC)   Essential hypertension   Uncontrolled  type 2 diabetes mellitus with hyperglycemia (HCC)   Chronic systolic CHF (congestive heart failure) (HCC)   CKD  (chronic kidney disease), stage IV (HCC)   Obesity (BMI 30-39.9)   Gastroesophageal reflux disease without esophagitis   HLD (hyperlipidemia)   Atrial flutter (HCC)   Anemia in chronic kidney disease (CKD)   Carlson Belland is a 66 y.o. y/o male with who comes in with a history of spitting up blood while being on Eliquis.  The patient had a similar episode with an upper endoscopy by Dr. Marius Ditch in the past with a normal upper endoscopy at that time.  The patient has been admitted under observation status due to his GI bleeding.  Plan: The patient will be set up for an EGD for tomorrow.  The patient will be made n.p.o. after midnight and has been explained the plan including risks and benefits of the procedure.  If the upper endoscopy is negative then the patient may be discharged from a GI point of view.  The patient has been explained the plan and agrees with it.  Thank you for involving me in the care of this patient.      LOS: 0 days   Larry Lame, MD, [facility] 01/10/2022, 12:44 PM,  Pager [phone] 7am-5pm  Check AMION for 5pm -7am coverage and on weekends   Note: This dictation was prepared with Dragon dictation along with smaller phrase technology. Any transcriptional errors that result from this process are unintentional.

## 2022-01-10 NOTE — Progress Notes (Signed)
Progress Note   Patient: Larry Callahan EPP:[id number] DOB: Feb 05, 1956 DOA: 01/09/2022     0 DOS: the patient was seen and examined on 01/10/2022   Brief hospital course: HPI on admission: Larry Callahan is a 66 y.o. male with medical history significant of a flutter on Eliquis, chronic systolic CHF, CAD, COPD, OSA, GERD, hypertension, type 2 diabetes, CKD stage IV, neuropathy, tobacco dependence, anemia of chronic disease, obesity, stable umbilical hernia who presented to the ED this afternoon due to nausea vomiting and noticing bright red blood in his vomit.  He also reported some mild shortness of breath, generalized weakness dizziness.  His blood glucose was found to be 60 in the ED. patient reported onset at 11 AM this morning of nausea vomiting and upset stomach.  He reports noticing red blood in the vomit and continues to spit some blood up at times.  He reports compliance with his Eliquis.  He otherwise reports feeling overall well.  He denies fever chills, cough congestion sore throat, headache, urinary symptoms, diarrhea, worsening weakness numbness or tingling or any other notable symptoms.   Of note, patient was admitted 7/24-25 for evaluation of abdominal pain.  General surgery was consulted due to the umbilical hernia which remains easily reducible and without complications.  No surgical intervention was recommended and none is indicated at this time.  ED provider offered CT of abdomen pelvis but patient declined stating that he had one recently and does not feel it is needed to repeat.     By chart review, patient has had frequent admissions and ED visits in the past several months.  Reviewed heart failure clinic note from 7/28 when patient was contacted and told to stop taking Entresto due to worsening renal function.   In the ED, patient's vital signs are stable other than mildly elevated BP.  Labs were notable for creatinine of 2.56 compared to 2.02 on 7/25 when  discharged and 2.49 on 7/28 just 2 days ago.  CBC was notable for hemoglobin of 12.2 which is up from 10.2 at time of discharge 7/25.   Patient's admitted to the hospital for observation given hematemesis in the setting of anticoagulation and felt high risk for acute decompensation.  GI consulted for recommendations.   7/31: awaiting GI consult & recommendations  Assessment and Plan: * Hematemesis Patient reported onset of nausea vomiting this morning around 11 AM.  He noted bright red blood in his vomit.  Denies prior history of same.  Hemoglobin stable.  Hemodynamically stable. -- Admitted for observation -- Trend hemoglobin (stable 11.7 from 12.2) -- IV PPI twice daily -- Clear liquid diet for now -- Hold off IV fluids due to systolic CHF -- GI consulted, recommendations pending  Atrial flutter ([place]) Heart rate controlled. -- Hold Eliquis due to hematemesis -- Monitor on telemetry  Chronic systolic CHF (congestive heart failure) ([place]) Euvolemic and compensated on admission. Most recent echo 08/08/2021 showed EF 40 to 45%, LV global hypokinesis, indeterminate diastolic parameters -- Monitor volume status closely  COPD (chronic obstructive pulmonary disease) (HCC) Stable, not acutely exacerbated. -- Monitor respiratory status -- Continue home regimen pending med history -- Supplement O2 if needed to maintain sat above 88%  Essential hypertension Home regimen appears to be Coreg, hydralazine. Resume home regimen pending med history. IV hydralazine as needed for now  Obstructive sleep apnea, adult Does not tolerate hospital CPAP mask. Nocturnal oxygen supplementation.  Gastroesophageal reflux disease without esophagitis On IV PPI every 12 hours given hematemesis  Anemia in chronic kidney disease (CKD) Hemoglobin stable on admission at 12.2, up from 11.7 on 7/28, 10.2 on 7/25. -- Monitor CBC  HLD (hyperlipidemia) Continue Crestor pending med history  Obesity (BMI  30-39.9) Body mass index is 35.02 kg/m. Complicates overall care and prognosis.  Recommend lifestyle modifications including physical activity and diet for weight loss and overall long-term health.   CKD (chronic kidney disease), stage IV (HCC) Creatinine above baseline on admission at 2.56, up from 2.02 at time of discharge on 7/25.  Noted heart failure clinic recently told patient to stop Entresto due to worsening renal function. -- Hold Entresto and other nephrotoxins -- Renally dose meds as indicated -- Monitor BMP -- Nephrology consult if worsening -- Monitor urine output  Uncontrolled type 2 diabetes mellitus with hyperglycemia (HCC) Recent A1c 7.0.  Takes glimepiride and Toujeo at home.  Noted to be hypoglycemic with CBG of 60 in the ED.   -- Hold home regimen -- Sensitive sliding scale NovoLog -- Hypoglycemia protocol  Diabetic polyneuropathy associated with type 2 diabetes mellitus ([place]) Continue home meds pending med history  Cigarette nicotine dependence with nicotine-induced disorder Nicotine patch ordered        Subjective: Pt awake resting in bed when seen today.  He reports continuing to "spit up blood".  Says it does not occur with coughing, it just comes up.  Denies abdominal pain or having N/V today.  Tolerating clear liquids but did not like the broth at all.   Physical Exam: Vitals:   01/09/22 2335 01/10/22 0456 01/10/22 0752 01/10/22 1626  BP: (!) 117/59 127/72 (!) 149/69 136/78  Pulse: 67 72 70 73  Resp: '18 20 16 18  '$ Temp: 98.5 F (36.9 C) 99.4 F (37.4 C) 98.4 F (36.9 C) 97.9 F (36.6 C)  TempSrc: Oral Oral  Oral  SpO2: 100% 100% 98% 100%  Weight:  109 kg    Height:       General exam: awake, alert, no acute distress, chronically ill-appearing HEENT: moist mucus membranes, hearing grossly normal  Respiratory system: CTAB but diminished throughout, no wheezes, on room air, normal respiratory effort. Cardiovascular system: normal S1/S2, RRR,  no pedal edema.   Gastrointestinal system: soft, mild tenderness around umbilical hernia otherwise non-tender, non-distended Central nervous system: no gross focal neurologic deficits, normal speech Extremities: moves all, no edema, normal tone Skin: dry, intact, normal temperature Psychiatry: normal mood, congruent affect, speech is a bit tangential and frequently unrelated to answer questions asked (off topic)   Data Reviewed:  Notable labs - Hbg 11.7 from 12.2, Cl 112, CO2 21, Cr 2.37 from 2.56, Ca 8.8, GFR 29  Family Communication: None  Disposition: Status is: Observation The patient remains OBS appropriate and will d/c before 2 midnights. Awaiting GI consult and recommendations.    Planned Discharge Destination: Home    Time spent: 35 minutes  Author: Ezekiel Slocumb, DO 01/10/2022 5:49 PM  For on call review [email or web address].

## 2022-01-11 ENCOUNTER — Observation Stay: Payer: Medicare PPO | Admitting: General Practice

## 2022-01-11 ENCOUNTER — Encounter
Admission: EM | Disposition: A | Discharge status: Home Health | Payer: Self-pay | Source: Home / Self Care | Attending: Emergency Medicine

## 2022-01-11 DIAGNOSIS — I2510 Atherosclerotic heart disease of native coronary artery without angina pectoris: Secondary | ICD-10-CM | POA: Diagnosis not present

## 2022-01-11 DIAGNOSIS — Z7189 Other specified counseling: Secondary | ICD-10-CM

## 2022-01-11 DIAGNOSIS — E782 Mixed hyperlipidemia: Secondary | ICD-10-CM

## 2022-01-11 DIAGNOSIS — E1165 Type 2 diabetes mellitus with hyperglycemia: Secondary | ICD-10-CM

## 2022-01-11 DIAGNOSIS — E1142 Type 2 diabetes mellitus with diabetic polyneuropathy: Secondary | ICD-10-CM

## 2022-01-11 DIAGNOSIS — Z515 Encounter for palliative care: Secondary | ICD-10-CM

## 2022-01-11 DIAGNOSIS — I10 Essential (primary) hypertension: Secondary | ICD-10-CM

## 2022-01-11 DIAGNOSIS — J438 Other emphysema: Secondary | ICD-10-CM

## 2022-01-11 DIAGNOSIS — N189 Chronic kidney disease, unspecified: Secondary | ICD-10-CM | POA: Diagnosis not present

## 2022-01-11 DIAGNOSIS — K219 Gastro-esophageal reflux disease without esophagitis: Secondary | ICD-10-CM

## 2022-01-11 DIAGNOSIS — E669 Obesity, unspecified: Secondary | ICD-10-CM

## 2022-01-11 DIAGNOSIS — I483 Typical atrial flutter: Secondary | ICD-10-CM

## 2022-01-11 DIAGNOSIS — F17219 Nicotine dependence, cigarettes, with unspecified nicotine-induced disorders: Secondary | ICD-10-CM | POA: Diagnosis not present

## 2022-01-11 DIAGNOSIS — I5022 Chronic systolic (congestive) heart failure: Secondary | ICD-10-CM | POA: Diagnosis not present

## 2022-01-11 DIAGNOSIS — K920 Hematemesis: Secondary | ICD-10-CM | POA: Diagnosis not present

## 2022-01-11 DIAGNOSIS — E1122 Type 2 diabetes mellitus with diabetic chronic kidney disease: Secondary | ICD-10-CM | POA: Diagnosis not present

## 2022-01-11 DIAGNOSIS — N184 Chronic kidney disease, stage 4 (severe): Secondary | ICD-10-CM | POA: Diagnosis not present

## 2022-01-11 DIAGNOSIS — G4733 Obstructive sleep apnea (adult) (pediatric): Secondary | ICD-10-CM

## 2022-01-11 DIAGNOSIS — D631 Anemia in chronic kidney disease: Secondary | ICD-10-CM

## 2022-01-11 DIAGNOSIS — I129 Hypertensive chronic kidney disease with stage 1 through stage 4 chronic kidney disease, or unspecified chronic kidney disease: Secondary | ICD-10-CM | POA: Diagnosis not present

## 2022-01-11 HISTORY — PX: ESOPHAGOGASTRODUODENOSCOPY (EGD) WITH PROPOFOL: SHX5813

## 2022-01-11 LAB — BASIC METABOLIC PANEL
Anion gap: 4 — ABNORMAL LOW (ref 5–15)
BUN: 35 mg/dL — ABNORMAL HIGH (ref 8–23)
CO2: 23 mmol/L (ref 22–32)
Calcium: 8.6 mg/dL — ABNORMAL LOW (ref 8.9–10.3)
Chloride: 111 mmol/L (ref 98–111)
Creatinine, Ser: 2.5 mg/dL — ABNORMAL HIGH (ref 0.61–1.24)
GFR, Estimated: 27 mL/min — ABNORMAL LOW (ref >60)
Glucose, Bld: 96 mg/dL (ref 70–99)
Potassium: 4.2 mmol/L (ref 3.5–5.1)
Sodium: 138 mmol/L (ref 135–145)

## 2022-01-11 LAB — GLUCOSE, CAPILLARY
Glucose-Capillary: 124 mg/dL — ABNORMAL HIGH (ref 70–99)
Glucose-Capillary: 113 mg/dL — ABNORMAL HIGH (ref 70–99)
Glucose-Capillary: 100 mg/dL — ABNORMAL HIGH (ref 70–99)
Glucose-Capillary: 166 mg/dL — ABNORMAL HIGH (ref 70–99)

## 2022-01-11 LAB — HEMOGLOBIN AND HEMATOCRIT, BLOOD
HCT: 34.8 % — ABNORMAL LOW (ref 39.0–52.0)
Hemoglobin: 11 g/dL — ABNORMAL LOW (ref 13.0–17.0)

## 2022-01-11 SURGERY — ESOPHAGOGASTRODUODENOSCOPY (EGD) WITH PROPOFOL
Anesthesia: General

## 2022-01-11 MED ORDER — PROPOFOL 10 MG/ML IV BOLUS
INTRAVENOUS | Status: DC | PRN
  Administered 2022-01-11: 100 mg via INTRAVENOUS

## 2022-01-11 MED ORDER — SODIUM CHLORIDE 0.9 % IV SOLN: INTRAVENOUS | Status: DC

## 2022-01-11 MED ORDER — BUDESONIDE 0.25 MG/2ML IN SUSP: 0.2500 mg | Freq: Two times a day (BID) | RESPIRATORY_TRACT | 12 refills | Status: DC

## 2022-01-11 MED ORDER — LIDOCAINE HCL (CARDIAC) PF 100 MG/5ML IV SOSY
PREFILLED_SYRINGE | INTRAVENOUS | Status: DC | PRN
  Administered 2022-01-11: 100 mg via INTRAVENOUS

## 2022-01-11 NOTE — Consult Note (Signed)
Palliative Care Consult Note                                  Date: 01/11/2022   Patient Name: Larry Callahan  DOB: 1956-06-02  MRN: [id number]  Age / Sex: 66 y.o., male  PCP: Larry Code, MD Referring Physician: Pennie Banter, DO  Reason for Consultation: Establishing goals of care  HPI/Patient Profile: 66 y.o. male  with past medical history of a flutter on Eliquis, chronic systolic CHF, CAD, COPD, OSA, GERD, hypertension, type 2 diabetes, CKD stage IV, neuropathy, tobacco dependence, anemia of chronic disease, obesity, stable umbilical hernia who presented to the ED this afternoon due to nausea vomiting and noticing bright red blood in his vomit. He was admitted on 01/09/2022 with hematemesis, atrial flutter, chronic systolic CHF, COPD, GERD, and others.   ENT was consulted for goals of care conversations.  Past Medical History:  Diagnosis Date   Allergy    Atrial flutter (HCC)    a. Dx 12/2020--CHA2DS2VASc = 5-->eliquis.   Chronic combined systolic (congestive) and diastolic (congestive) heart failure (HCC)    a. 04/2019 Echo: EF 45-50%; b. 02/2019 Echo: EF 55-60%; c. 09/2020 Echo: EF 35-40%, glob HK. Nl RV size/fxn. Mild MR; d. 07/2021 Echo: EF 40-45%, mild LVH, low-nl RV fxn, no MR.   CKD (chronic kidney disease), stage III (HCC)    COPD (chronic obstructive pulmonary disease) (HCC)    Coronary artery disease    a. 2011 Cath: nonobs dzs; b. 09/2020 MV: EF 38%. No signif ischemia. ? inf MI vs diaph attenuation. GI uptake artifact.   Diabetes mellitus without complication (HCC)    GERD (gastroesophageal reflux disease)    History of CVA (cerebrovascular accident) 09/25/2020   Hyperlipidemia    Hypertension    Morbid (severe) obesity due to excess calories (HCC) 06/09/2017   Morbid obesity (HCC)    NICM (nonischemic cardiomyopathy) (HCC)    a. 04/2019 Echo: EF 45-50%; b. 02/2019 Echo: EF 55-60%; c. 09/2020 Echo: EF 35-40%, glob  HK; d. 09/2020 MV: No ischemia. ? inf infarct vs attenuation; d. 07/2021 Echo: EF 40-45%.   OSA (obstructive sleep apnea)    Pneumonia due to COVID-19 virus 01/30/2020   Stage 3b chronic kidney disease (HCC)    Syncope and collapse 11/16/2020    Subjective:   This NP Larry Callahan reviewed medical records, received report from team, assessed the patient and then meet at the patient's bedside to discuss diagnosis, prognosis, GOC, EOL wishes disposition and options.  I met with the patient at the bedside.  No family was present.   Concept of Palliative Care was introduced as specialized medical care for people and their families living with serious illness.  If focuses on providing relief from the symptoms and stress of a serious illness.  The goal is to improve quality of life for both the patient and the family. Values and goals of care important to patient and family were attempted to be elicited.  Created space and opportunity for patient  and family to explore thoughts and feelings regarding current medical situation   Natural trajectory and current clinical status were discussed. Questions and concerns addressed. Patient  encouraged to call with questions or concerns.    Patient/Family Understanding of Illness: Nursing communication he was "spitting up blood".  He states he previously had a cyst on his right kidney.  Kidney function is doing well  although he did have his left kidney urinary.  He states "they say I have CHF and COPD "and placed a breathing treatment "when I need 1".  When pressed he states that he averages breathing treatment about every 6 hours which does help his breathing.  We had further exploration and discussion about his history including previous episodes of hematemesis and ANEMIA, CKD stage IV with baseline creatinine 2.02 (3.58 today with stopping Entresto).  Also discussed atrial flutter requiring Eliquis (which has been temporarily stopped due to GI bleed) as well as  chronic heart failure which is compensated on admission and noted EF 40 to 45% with global left ventricular hypokinesis.  Life Review: He states he is on disability.  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  He works outdoors, yard work, has a garden that he likes to care for.  He also likes sports at all times.  He is not married and has no kids.  Patient Values: Try to stay out of the hospital, independence  Goals: Get better, discharge from the hospital, and get back to life  Today's Discussion: In addition, continuing in discussions described above.  There is discussions on multiple topics.  He states he does try to avoid hospitalization but if he becomes very sick Uceris become in the hospital.  He states he has had his breathing before and they did not find anything.  He states that he is hungry and is hoping to have his EGD soon.  He has specific questions about sedation and whether he will be asleep for the procedure.  I discussed the EGD procedure including sedation and with either conscious sedation or propofol and he stated he will not be awake during the procedure.  Overall he states he feels like he has "a handle on his chronic continues".  Because he does not have children or a spouse I discussed with him he would want to make his decisions if he cannot make his decisions for himself.  He states he would like his sister [facility] (contact info the patient is) to be his surrogate decision-maker if needed.  I provided emotional and general support and therapeutic listening, empathy, sharing stories, and other techniques.  Answered all questions and addressed all concerns to the best of my ability.  Review of Systems  Constitutional:  Positive for fatigue.  Respiratory:  Negative for cough and shortness of breath.   Gastrointestinal:  Negative for abdominal pain, nausea and vomiting.    Objective:   Primary Diagnoses: Present on Admission:   Hematemesis  Anemia in chronic kidney disease (CKD)  CKD (chronic kidney disease), stage IV (HCC)  Chronic systolic CHF (congestive heart failure) (HCC)  Atrial flutter (HCC)  COPD (chronic obstructive pulmonary disease) ([facility])  Essential hypertension  Gastroesophageal reflux disease without esophagitis  HLD (hyperlipidemia)  Obstructive sleep apnea, adult  Obesity (BMI 30-39.9)  Uncontrolled type 2 diabetes mellitus with hyperglycemia (HCC)  Diabetic polyneuropathy associated with type 2 diabetes mellitus (HCC)  Cigarette nicotine dependence with nicotine-induced disorder   Physical Exam Vitals and nursing note reviewed.  Constitutional:      General: He is not in acute distress.    Appearance: He is ill-appearing. He is not toxic-appearing.  HENT:     Head: Normocephalic and atraumatic.  Cardiovascular:     Rate and Rhythm: Normal rate.  Pulmonary:     Effort: Pulmonary effort is normal. No respiratory distress.     Breath sounds: No  wheezing.  Abdominal:     General: Abdomen is protuberant. Bowel sounds are normal.     Palpations: Abdomen is soft.  Skin:    General: Skin is warm and dry.  Neurological:     General: No focal deficit present.  Psychiatric:        Mood and Affect: Mood normal.        Behavior: Behavior normal.     Vital Signs:  BP (!) 142/83   Pulse 72   Temp 98 F (36.7 C)   Resp 16   Ht _0  (1.803 m)   Wt 109 kg   SpO2 100%   BMI 33.52 kg/m   Palliative Assessment/Data: 50-60%    Advanced Care Planning:   Primary Decision Maker: PATIENT  Callahan Status/Advance Care Planning: Full Callahan  A discussion was had today regarding advanced directives. Concepts specific to Callahan status, artifical feeding and hydration, continued IV antibiotics and rehospitalization was had.  The difference between a aggressive medical intervention path and a palliative comfort care path for this patient at this time was had.   Decisions/Changes to ACP: None  today, wishes to remain full Callahan at this time  Assessment & Plan:   Impression: 66 year old male with chronic kidney disease stage IV (not on hemodialysis) as well as CHF procedure well compensated, COPD Vynca stable.  Presents with concomitant think continued has been admitted for placement.  Last EKG 120 years ago was essentially normal.  Currently needs to be admitted to further evaluate, hemoglobin is stable with no precipitous decline noted.  Patient feels he is getting along his chronic Conditions.  He is cardiovascular full Callahan.  His goal is to get out of the hospital to continue to leave his life.  SUMMARY OF RECOMMENDATIONS   Continue full scope of care Remain full Callahan Plan EGD today Further recommendations from gastroenterology following EGD PMT will continue to follow for any needs  Symptom Management:  Per primary team PMT is available to assist as needed  Prognosis:  Unable to determine  Discharge Planning:  To Be Determined   Discussed with: Patient, medical team, nursing    Thank you for allowing [name] to participate in the care of Tedd Cottrill Ebner PMT will continue to support holistically.  Time Total: 100 min  Greater than 50%  of this time was spent counseling and coordinating care related to the above assessment and plan.  Signed by: Walden Field, NP Palliative Medicine Team  Team Phone # [phone] (Nights/Weekends)  01/11/2022, 10:42 AM

## 2022-01-11 NOTE — Transfer of Care (Signed)
Immediate Anesthesia Transfer of Care Note  Patient: Larry Callahan  Procedure(s) Performed: ESOPHAGOGASTRODUODENOSCOPY (EGD) WITH PROPOFOL  Patient Location: Endoscopy Unit  Anesthesia Type:General  Level of Consciousness: awake, alert  and oriented  Airway & Oxygen Therapy: Patient Spontanous Breathing and Patient connected to nasal cannula oxygen  Post-op Assessment: Report given to RN, Post -op Vital signs reviewed and stable and Patient moving all extremities  Post vital signs: Reviewed and stable  Last Vitals:  Vitals Value Taken Time  BP    Temp    Pulse    Resp    SpO2      Last Pain:  Vitals:   01/11/22 1557  TempSrc: Temporal  PainSc: 0-No pain         Complications: No notable events documented.

## 2022-01-11 NOTE — Assessment & Plan Note (Signed)
Resolved.  Pt reported hematemesis.  None reported since admission and EGD unremarkable, see report.

## 2022-01-11 NOTE — Anesthesia Preprocedure Evaluation (Addendum)
Anesthesia Evaluation  Patient identified by MRN, date of birth, ID band Patient awake    Reviewed: Allergy & Precautions, NPO status , Patient's Chart, lab work & pertinent test results  Airway Mallampati: II  TM Distance: >3 FB Neck ROM: full    Dental no notable dental hx.    Pulmonary sleep apnea , COPD, former smoker,    Pulmonary exam normal        Cardiovascular Exercise Tolerance: Poor hypertension, + CAD and +CHF  Normal cardiovascular exam+ dysrhythmias   09/2020 MV: EF 38%. No signif ischemia. ? inf MI vs diaph attenuation. GI uptake artifact   Neuro/Psych  Headaches, negative psych ROS   GI/Hepatic Neg liver ROS, GERD  ,  Endo/Other  negative endocrine ROSdiabetes  Renal/GU CRFRenal disease     Musculoskeletal   Abdominal Normal abdominal exam  (+)   Peds  Hematology negative hematology ROS (+)   Anesthesia Other Findings Dizziness, HA   Past Medical History: No date: Allergy No date: Atrial flutter ([place])     Comment:  a. Dx 12/2020--CHA2DS2VASc = 5-->eliquis. No date: Chronic combined systolic (congestive) and diastolic  (congestive) heart failure ([place])     Comment:  a. 04/2019 Echo: EF 45-50%; b. 02/2019 Echo: EF 55-60%;               c. 09/2020 Echo: EF 35-40%, glob HK. Nl RV size/fxn. Mild               MR; d. 07/2021 Echo: EF 40-45%, mild LVH, low-nl RV fxn,               no MR. No date: CKD (chronic kidney disease), stage III (HCC) No date: COPD (chronic obstructive pulmonary disease) ([place]) No date: Coronary artery disease     Comment:  a. 2011 Cath: nonobs dzs; b. 09/2020 MV: EF 38%. No               signif ischemia. ? inf MI vs diaph attenuation. GI uptake              artifact. No date: Diabetes mellitus without complication (HCC) No date: GERD (gastroesophageal reflux disease) 09/25/2020: History of CVA (cerebrovascular accident) No date: Hyperlipidemia No date: Hypertension 06/09/2017:  Morbid (severe) obesity due to excess calories ([place]) No date: Morbid obesity ([place]) No date: NICM (nonischemic cardiomyopathy) ([place])     Comment:  a. 04/2019 Echo: EF 45-50%; b. 02/2019 Echo: EF 55-60%;               c. 09/2020 Echo: EF 35-40%, glob HK; d. 09/2020 MV: No               ischemia. ? inf infarct vs attenuation; d. 07/2021 Echo:               EF 40-45%. No date: OSA (obstructive sleep apnea) 01/30/2020: Pneumonia due to COVID-19 virus No date: Stage 3b chronic kidney disease ([place]) 11/16/2020: Syncope and collapse  Past Surgical History: No date: BACK SURGERY No date: CARDIAC CATHETERIZATION No date: CHOLECYSTECTOMY 04/12/2019: COLONOSCOPY WITH PROPOFOL; N/A     Comment:  Procedure: COLONOSCOPY WITH PROPOFOL;  Surgeon: [name], MD;  Location: [name] ENDOSCOPY;  Service:               Gastroenterology;  Laterality: N/A; 04/12/2019: ESOPHAGOGASTRODUODENOSCOPY; N/A     Comment:  Procedure: ESOPHAGOGASTRODUODENOSCOPY (EGD);  Surgeon:  [name], MD;  Location: [name] ENDOSCOPY;                Service: Gastroenterology;  Laterality: N/A; 11/21/2019: ESOPHAGOGASTRODUODENOSCOPY (EGD) WITH PROPOFOL; N/A     Comment:  Procedure: ESOPHAGOGASTRODUODENOSCOPY (EGD) WITH               PROPOFOL;  Surgeon: [name], MD;  Location:               [name] ENDOSCOPY;  Service: Gastroenterology;  Laterality:               N/A; 05/17/2019: FLEXIBLE BRONCHOSCOPY; Bilateral     Comment:  Procedure: FLEXIBLE BRONCHOSCOPY;  Surgeon: [name], MD;  Location: [name] ORS;  Service: Pulmonary;                Laterality: Bilateral; No date: left arm surgery No date: nephrectomy; Left No date: PARATHYROIDECTOMY 03/29/2019: RIGHT HEART CATH; N/A     Comment:  Procedure: RIGHT HEART CATH;  Surgeon: [name], MD;  Location: [place] [facility];  Service:               Cardiovascular;  Laterality: N/A;  BMI    Body  Mass Index: 33.52 kg/m      Reproductive/Obstetrics negative OB ROS                           Anesthesia Physical Anesthesia Plan  ASA: 3  Anesthesia Plan: General ETT   Post-op Pain Management: Minimal or no pain anticipated   Induction: Intravenous and Rapid sequence  PONV Risk Score and Plan: Ondansetron, Dexamethasone, Midazolam and Treatment may vary due to age or medical condition  Airway Management Planned: Oral ETT  Additional Equipment:   Intra-op Plan:   Post-operative Plan: Extubation in OR  Informed Consent: I have reviewed the patients History and Physical, chart, labs and discussed the procedure including the risks, benefits and alternatives for the proposed anesthesia with the patient or authorized representative who has indicated his/her understanding and acceptance.     Dental Advisory Given  Plan Discussed with: Anesthesiologist, CRNA and Surgeon  Anesthesia Plan Comments:        Anesthesia Quick Evaluation

## 2022-01-11 NOTE — Discharge Summary (Signed)
Physician Discharge Summary   Patient: Larry Callahan MRN: [id number] DOB: 02-23-1956  Admit date:     01/09/2022  Discharge date: 01/12/2022, [place]  Discharge Physician: Nolberto Hanlon   PCP: Lavera Guise, MD   Recommendations at discharge:    Follow up with Primary Care in 1-2 weeks Repeat CBC, BMP, Mg in 1-2 weeks Follow up with Cardiology and in [facility] as scheduled Follow up with Gastroenterology if needed or recommended after EGD    ADDENDUM PM --- discharge had to be cancelled as pt was dizzy after EGD this evening.  Didn't have a ride, and felt he wouldn't be able to ambulate from cab into his home.  Friend to pick up tomorrow.  Medically stable.   Addendum 8/2 patient feels better. No c/o dizziness, abd pain, n/v or sob  Discharge Diagnoses: Principal Problem:   Hematemesis Active Problems:   Atrial flutter (HCC)   Chronic systolic CHF (congestive heart failure) (HCC)   COPD (chronic obstructive pulmonary disease) (HCC)   Essential hypertension   Obstructive sleep apnea, adult   Gastroesophageal reflux disease without esophagitis   Cigarette nicotine dependence with nicotine-induced disorder   Diabetic polyneuropathy associated with type 2 diabetes mellitus ([place])   Uncontrolled type 2 diabetes mellitus with hyperglycemia (HCC)   CKD (chronic kidney disease), stage IV (HCC)   Obesity (BMI 30-39.9)   HLD (hyperlipidemia)   Anemia in chronic kidney disease (CKD)   Upper GI bleed  Resolved Problems:   * No resolved hospital problems. *  Hospital Course: HPI on admission: Larry Callahan is a 66 y.o. male with medical history significant of a flutter on Eliquis, chronic systolic CHF, CAD, COPD, OSA, GERD, hypertension, type 2 diabetes, CKD stage IV, neuropathy, tobacco dependence, anemia of chronic disease, obesity, stable umbilical hernia who presented to the ED this afternoon due to nausea vomiting and noticing bright red blood in his vomit.  He  also reported some mild shortness of breath, generalized weakness dizziness.  His blood glucose was found to be 60 in the ED. patient reported onset at 11 AM this morning of nausea vomiting and upset stomach.  He reports noticing red blood in the vomit and continues to spit some blood up at times.  He reports compliance with his Eliquis.  He otherwise reports feeling overall well.  He denies fever chills, cough congestion sore throat, headache, urinary symptoms, diarrhea, worsening weakness numbness or tingling or any other notable symptoms.   Of note, patient was admitted 7/24-25 for evaluation of abdominal pain.  General surgery was consulted due to the umbilical hernia which remains easily reducible and without complications.  No surgical intervention was recommended and none is indicated at this time.  ED provider offered CT of abdomen pelvis but patient declined stating that he had one recently and does not feel it is needed to repeat.     By chart review, patient has had frequent admissions and ED visits in the past several months.  Reviewed heart failure clinic note from 7/28 when patient was contacted and told to stop taking Entresto due to worsening renal function.   In the ED, patient's vital signs are stable other than mildly elevated BP.  Labs were notable for creatinine of 2.56 compared to 2.02 on 7/25 when discharged and 2.49 on 7/28 just 2 days ago.  CBC was notable for hemoglobin of 12.2 which is up from 10.2 at time of discharge 7/25.   Patient's admitted to the hospital for  observation given hematemesis in the setting of anticoagulation and felt high risk for acute decompensation.  GI consulted for recommendations.   7/31: awaiting GI consult & recommendations 8/1: GI plans for EGD later today. Pt is NPO awaiting evaluation.  Patient is otherwise clinically improved, hemoglobin is stable. No reports of further hematemesis.  Stable for discharge home this evening after EGD, if cleared  by GI.      Assessment and Plan: * Hematemesis Resolved and Hbg remained stable. Patient reported onset of nausea vomiting this morning around 11 AM.  He noted bright red blood in his vomit.  Denies prior history of same.  Hemoglobin stable.  Hemodynamically stable. -- Admitted for observation -- Trend hemoglobin (stable 11.7 from 12.2) -- Treated IV PPI twice daily -- Resume omeprazole -- EGD today negative  Atrial flutter (HCC) Heart rate controlled. -- Held Eliquis due to hematemesis --Hbg remained stable --Resume Eliquis at discharge   Chronic systolic CHF (congestive heart failure) (HCC) Euvolemic and compensated on admission. Most recent echo 08/08/2021 showed EF 40 to 45%, LV global hypokinesis, indeterminate diastolic parameters -- Monitor volume status closely --Off Entresto due to worsening renal function --Continue Coreg, Lasix, hydralazine  COPD (chronic obstructive pulmonary disease) (HCC) Stable, not acutely exacerbated. O2 sats stable on room air. -- Monitor respiratory status -- Continue home regimen pending med history   Essential hypertension Continue Coreg, hydralazine.   Obstructive sleep apnea, adult Does not tolerate hospital CPAP mask. Nocturnal oxygen supplementation.  Gastroesophageal reflux disease without esophagitis Treated with IV PPI every 12 hours given hematemesis.  Resume omeprazole at discharge.  Upper GI bleed Resolved.  Pt reported hematemesis.  None reported since admission and EGD unremarkable, see report.  Anemia in chronic kidney disease (CKD) Hemoglobin stable on admission at 12.2, up from 11.7 on 7/28, 10.2 on 7/25. -- Monitor CBC  HLD (hyperlipidemia) Continue Crestor   Obesity (BMI 30-39.9) Body mass index is 35.02 kg/m. Complicates overall care and prognosis.  Recommend lifestyle modifications including physical activity and diet for weight loss and overall long-term health.   CKD (chronic kidney disease),  stage IV (HCC) Creatinine above baseline on admission at 2.56, up from 2.02 at time of discharge on 7/25.  Noted heart failure clinic recently told patient to stop Entresto due to worsening renal function. Suspect progression of CKD -- Renally dose meds as indicated -- Entresto recently stopped per HF clinic note -- Monitor BMP at follow up -- Monitor urine output  Uncontrolled type 2 diabetes mellitus with hyperglycemia (HCC) Recent A1c 7.0.  Takes glimepiride and Toujeo at home.  Noted to be hypoglycemic with CBG of 60 in the ED.   -- Stop glimepiride due to worsening renal function and risk of hypoglycemia -- Resume Toujeo at discharge -- PCP follow up --Covered with sliding scale Novolog   Diabetic polyneuropathy associated with type 2 diabetes mellitus ([place]) No acute issues.  Cigarette nicotine dependence with nicotine-induced disorder Nicotine patch ordered         Consultants: gastroenterology Procedures performed: EGD  Disposition: Home health  Diet recommendation:  Discharge Diet Orders (From admission, onward)     Start     Ordered   01/11/22 0000  Diet - low sodium heart healthy        01/11/22 1509           Cardiac and Carb modified diet DISCHARGE MEDICATION: Allergies as of 01/12/2022       Reactions   Penicillins Rash  Medication List     STOP taking these medications    Entresto 24-26 MG Generic drug: sacubitril-valsartan   glimepiride 2 MG tablet Commonly known as: AMARYL       TAKE these medications    Accu-Chek Guide test strip Generic drug: glucose blood Use to check blood sugars 4 times a day   E11.65   Accu-Chek Softclix Lancets lancets Use to check blood sugars 4 times a day   E11.65   apixaban 5 MG Tabs tablet Commonly known as: ELIQUIS Take 1 tablet (5 mg total) by mouth 2 (two) times daily.   Breztri Aerosphere 160-9-4.8 MCG/ACT Aero Generic drug: Budeson-Glycopyrrol-Formoterol Inhale into the lungs.  Samples given.... 2 puffs in the morning and 2 puffs at bedtime   budesonide 0.25 MG/2ML nebulizer solution Commonly known as: PULMICORT Take 2 mLs (0.25 mg total) by nebulization 2 (two) times daily. What changed: when to take this   carvedilol 12.5 MG tablet Commonly known as: Coreg Take 1 tablet (12.5 mg total) by mouth 2 (two) times daily.   cetirizine 10 MG tablet Commonly known as: ZYRTEC Take 10 mg by mouth daily as needed for allergies. Buys OTC   cyanocobalamin 1000 MCG tablet Take 1 tablet (1,000 mcg total) by mouth daily.   furosemide 40 MG tablet Commonly known as: LASIX Take 1 tablet (40 mg total) by mouth daily. Take lasix '40mg'$  po daily   hydrALAZINE 100 MG tablet Commonly known as: APRESOLINE Take 100 mg by mouth 3 (three) times daily.   ipratropium-albuterol 0.5-2.5 (3) MG/3ML Soln Commonly known as: DUONEB Take 3 mLs by nebulization every 6 (six) hours as needed.   Novofine Pen Needle 32G X 6 MM Misc Generic drug: Insulin Pen Needle Use as directed   omeprazole 40 MG capsule Commonly known as: PRILOSEC Take 1 capsule once in the morning and once in the evening   potassium chloride 10 MEQ tablet Commonly known as: KLOR-CON M Take 10 mEq by mouth daily.   rosuvastatin 10 MG tablet Commonly known as: CRESTOR Take 1 tablet (10 mg total) by mouth at bedtime.   Toujeo Max SoloStar 300 UNIT/ML Solostar Pen Generic drug: insulin glargine (2 Unit Dial) Inject 20 units in am   Toujeo SoloStar 300 UNIT/ML Solostar Pen Generic drug: insulin glargine (1 Unit Dial) Inject 10 Units into the skin daily.   True Metrix Air Glucose Meter Devi 1 Device by Does not apply route 2 (two) times daily.        Discharge Exam: Filed Weights   01/09/22 2156 01/10/22 0456 01/12/22 0313  Weight: 109 kg 109 kg 107.1 kg   General exam: awake, alert, no acute distress, chronically ill appearing Respiratory system: on room air, normal respiratory  effort. Cardiovascular system: RRR, no pedal edema.   Gastrointestinal system: soft, NT, ND, no HSM felt, +bowel sounds. Central nervous system: no gross focal neurologic deficits, normal speech Extremities: moves all, no edema Skin: dry, intact, normal temperature Psychiatry: normal mood, congruent affect,    Condition at discharge: stable  The results of significant diagnostics from this hospitalization (including imaging, microbiology, ancillary and laboratory) are listed below for reference.   Imaging Studies: MR BRAIN/IAC W WO CONTRAST  Result Date: 01/04/2022 CLINICAL DATA:  Initial evaluation for acute dizziness, vertigo. EXAM: MRI HEAD WITHOUT AND WITH CONTRAST TECHNIQUE: Multiplanar, multiecho pulse sequences of the brain and surrounding structures were obtained without and with intravenous contrast. CONTRAST:  32m GADAVIST GADOBUTROL 1 MMOL/ML IV SOLN COMPARISON:  Prior MRI  from 02/18/2021. FINDINGS: Brain: Examination degraded by motion artifact. Cerebral volume within normal limits. Probable mild chronic microvascular ischemic disease noted involving the periventricular white matter. No evidence for acute or subacute infarct. Gray-white matter differentiation maintained. No areas of chronic cortical infarction. No acute or chronic intracranial blood products. No mass lesion within the brain itself. No mass effect or midline shift. No hydrocephalus or extra-axial fluid collection. Pituitary gland and suprasellar region within normal limits. No abnormal enhancement within the brain. Thin section imaging through the internal auditory canals was performed. Seventh and eighth cranial nerves are seen coursing normally through the cerebellopontine angle cisterns into the internal auditory canals. No CPA angle mass. No intracanalicular mass or abnormal enhancement. Inner ear structures including the vestibulae, cochlea, and semi circular canals are normal. Normal post ganglionic enhancement seen  within the seventh cranial nerves bilaterally. Trace bilateral mastoid effusions noted, of doubtful significance. Visualized nasopharynx unremarkable. Vascular: Major intracranial vascular flow voids are maintained. Skull and upper cervical spine: Craniocervical junction within normal limits. Bone marrow signal intensity normal. No scalp soft tissue abnormality. Sinuses/Orbits: Globes and orbital soft tissues demonstrate no acute finding. Paranasal sinuses are largely clear. Other: None. IMPRESSION: 1. Negative IAC protocol MRI of the brain. No structural findings to explain patient's symptoms identified. 2. No other acute intracranial abnormality. 3. Underlying mild chronic microvascular ischemic disease. Electronically Signed   By: Jeannine Boga M.D.   On: 01/04/2022 02:52   CT ABDOMEN PELVIS WO CONTRAST  Result Date: 01/03/2022 CLINICAL DATA:  Hernia, complicated. Pt to ED for stabbing lower abdominal pain more to R side where has hernia. 9/10 pain EXAM: CT ABDOMEN AND PELVIS WITHOUT CONTRAST TECHNIQUE: Multidetector CT imaging of the abdomen and pelvis was performed following the standard protocol without IV contrast. RADIATION DOSE REDUCTION: This exam was performed according to the departmental dose-optimization program which includes automated exposure control, adjustment of the mA and/or kV according to patient size and/or use of iterative reconstruction technique. COMPARISON:  CT abdomen pelvis 11/30/2020 FINDINGS: Lower chest: Query enlarged atria bilaterally. Cardiac changes suggestive of anemia. No acute abnormality. Hepatobiliary: No focal liver abnormality. Status post cholecystectomy. No biliary dilatation. Pancreas: No focal lesion. Normal pancreatic contour. No surrounding inflammatory changes. No main pancreatic ductal dilatation. Spleen: Multiple calcifications sick stress chin of sequelae of prior granulomatous disease. Otherwise normal in size without focal abnormality. Splenule  noted. Adrenals/Urinary Tract: No adrenal nodule bilaterally. No nephrolithiasis and no hydronephrosis. Bilateral for dense lesions likely represent simple renal cysts. Simple renal cysts, in the absence of clinically indicated signs/symptoms, require no independent follow-up. Interval increase in size of a heterogeneous appearance 4.7 x 5 cm right inferior renal pole lesion with associated mural nodularity. No ureterolithiasis or hydroureter. The urinary bladder is unremarkable. Stomach/Bowel: Stomach is within normal limits. No evidence of bowel wall thickening or dilatation. Colonic diverticulosis. Appendix appears normal. Vascular/Lymphatic: No abdominal aorta or iliac aneurysm. Severe atherosclerotic plaque of the aorta and its branches. No abdominal, pelvic, or inguinal lymphadenopathy. Reproductive: Prominent prostate. Other: No intraperitoneal free fluid. No intraperitoneal free gas. No organized fluid collection. Musculoskeletal: Stable small to moderate volume fat containing umbilical hernia with an abdominal defect of 2 cm. No suspicious lytic or blastic osseous lesions. No acute displaced fracture. Multilevel degenerative changes of the spine. IMPRESSION: 1. Interval stable increase in size of a complex indeterminate 4.7 x 5 cm right inferior renal pole lesion concerning for malignancy/renal cell carcinoma. Markedly limited evaluation on this noncontrast study. When the patient is  clinically stable and able to follow directions and hold their breath (preferably as an outpatient) further evaluation with dedicated abdominal MRI renal protocol should be considered. 2. Colonic diverticulosis with no acute diverticulitis. 3. Stable small to moderate volume fat containing umbilical hernia. 4. Cardiomegaly. 5. Findings suggestive of anemia. 6.  Aortic Atherosclerosis (ICD10-I70.0). Electronically Signed   By: Iven Finn M.D.   On: 01/03/2022 16:34   DG Chest 2 View  Result Date: 12/17/2021 CLINICAL  DATA:  Shortness of breath.  Chest pain. EXAM: CHEST - 2 VIEW COMPARISON:  Chest x-ray September 23, 2021. FINDINGS: Patient rotation. Chronic enlargement the cardiac silhouette. Tortuous aorta. Mild streaky left basilar opacities. No confluent consolidation. No visible pleural effusions or pneumothorax. Degenerative changes of the thoracic spine. No acute osseous abnormality. IMPRESSION: 1. Mild streaky left basilar opacities, favor atelectasis. 2. Chronic cardiomegaly. Electronically Signed   By: Margaretha Sheffield M.D.   On: 12/17/2021 19:51    Microbiology: Results for orders placed or performed during the hospital encounter of 12/17/21  SARS Coronavirus 2 by RT PCR (hospital order, performed in [facility] hospital lab) *cepheid single result test* Anterior Nasal Swab     Status: None   Collection Time: 12/17/21 11:43 PM   Specimen: Anterior Nasal Swab  Result Value Ref Range Status   SARS Coronavirus 2 by RT PCR NEGATIVE NEGATIVE Final    Comment: (NOTE) SARS-CoV-2 target nucleic acids are NOT DETECTED.  The SARS-CoV-2 RNA is generally detectable in upper and lower respiratory specimens during the acute phase of infection. The lowest concentration of SARS-CoV-2 viral copies this assay can detect is 250 copies / mL. A negative result does not preclude SARS-CoV-2 infection and should not be used as the sole basis for treatment or other patient management decisions.  A negative result may occur with improper specimen collection / handling, submission of specimen other than nasopharyngeal swab, presence of viral mutation(s) within the areas targeted by this assay, and inadequate number of viral copies (<250 copies / mL). A negative result must be combined with clinical observations, patient history, and epidemiological information.  Fact Sheet for Patients:   https://www.patel.info/  Fact Sheet for Healthcare Providers: https://hall.com/  This test  is not yet approved or  cleared by the [place] FDA and has been authorized for detection and/or diagnosis of SARS-CoV-2 by FDA under an Emergency Use Authorization (EUA).  This EUA will remain in effect (meaning this test can be used) for the duration of the COVID-19 declaration under Section 564(b)(1) of the Act, 21 U.S.C. section 360bbb-3(b)(1), unless the authorization is terminated or revoked sooner.  Performed at [facility], [address] [place], [place] [address]     Labs: CBC: Recent Labs  Lab 01/07/22 1200 01/09/22 1341 01/10/22 0537 01/11/22 0422  WBC 7.0 6.9 6.3  --   NEUTROABS 4.3  --   --   --   HGB 11.7* 12.2* 11.7* 11.0*  HCT 37.1* 39.0 36.2* 34.8*  MCV 85.5 85.3 85.6  --   PLT 210 227 179  --    Basic Metabolic Panel: Recent Labs  Lab 01/07/22 1200 01/09/22 1341 01/10/22 0537 01/11/22 0422  NA 138 138 140 138  K 4.7 4.5 4.2 4.2  CL 110 112* 112* 111  CO2 20* 20* 21* 23  GLUCOSE 115* 73 103* 96  BUN 40* 38* 37* 35*  CREATININE 2.49* 2.56* 2.37* 2.50*  CALCIUM 8.8* 8.8* 8.8* 8.6*   Liver Function Tests: Recent Labs  Lab  01/09/22 1341  AST 20  ALT 16  ALKPHOS 80  BILITOT 0.9  PROT 7.3  ALBUMIN 3.2*   CBG: Recent Labs  Lab 01/11/22 0744 01/11/22 1143 01/11/22 1600 01/11/22 1922 01/12/22 0817  GLUCAP 124* 113* 100* 166* 164*    Discharge time spent: greater than 30 minutes.  Signed: Nolberto Hanlon, MD [facility] 01/12/2022

## 2022-01-11 NOTE — TOC Transition Note (Signed)
Transition of Care [facility]) - CM/SW Discharge Note   Patient Details  Name: Larry Callahan MRN: [id number] Date of Birth: [birth year]  Transition of [facility]) CM/SW Contact:  Beverly Sessions, RN Phone Number: 01/11/2022, 3:20 PM   Clinical Narrative:     Per MD anticipated dc today after EGD Requested MD to order resumption orders for home health.  Merleen Nicely with [facility] notified.   Per nursing patient will not have a ride tonight.  Bedside RN to provide taxi voucher     Barriers to Discharge: Continued Medical Work up   Patient Goals and CMS Choice        Discharge Placement                       Discharge Plan and Services                                     Social Determinants of Health (SDOH) Interventions     Readmission Risk Interventions    12/01/2021   12:25 PM 08/07/2021    2:36 PM 06/28/2021    2:26 PM  Readmission Risk Prevention Plan  Transportation Screening Complete Complete Complete  Medication Review [employer]) Complete Complete Complete  PCP or Specialist appointment within 3-5 days of discharge Complete Complete Complete  HRI or Home Care Consult Complete Complete Complete  SW Recovery Care/Counseling Consult Complete Complete Complete  Palliative Care Screening Complete Complete Not Applicable  [place] Complete Complete Not Applicable

## 2022-01-11 NOTE — Op Note (Signed)
[facility] Gastroenterology Patient Name: Summer Parthasarathy Procedure Date: 01/11/2022 4:00 PM MRN: [id number] Account #: [id number] Date of Birth: 1956-01-29 Admit Type: Inpatient Age: 66 Room: [facility] ENDO ROOM 4 Gender: Male Note Status: Finalized Instrument Name: Upper Endoscope [id number] Procedure:             Upper GI endoscopy Indications:           Hematemesis Providers:             [name] MD, MD Referring MD:          Lavera Guise, MD (Referring MD) Medicines:             Propofol per Anesthesia Complications:         No immediate complications. Procedure:             Pre-Anesthesia Assessment:                        - Prior to the procedure, a History and Physical was                         performed, and patient medications and allergies were                         reviewed. The patient's tolerance of previous                         anesthesia was also reviewed. The risks and benefits                         of the procedure and the sedation options and risks                         were discussed with the patient. All questions were                         answered, and informed consent was obtained. Prior                         Anticoagulants: The patient has taken no previous                         anticoagulant or antiplatelet agents. ASA Grade                         Assessment: II - A patient with mild systemic disease.                         After reviewing the risks and benefits, the patient                         was deemed in satisfactory condition to undergo the                         procedure.                        After obtaining informed consent, the endoscope was  passed under direct vision. Throughout the procedure,                         the patient's blood pressure, pulse, and oxygen                         saturations were monitored continuously. The Endoscope                         was introduced  through the mouth, and advanced to the                         second part of duodenum. The upper GI endoscopy was                         accomplished without difficulty. The patient tolerated                         the procedure well. Findings:      The Z-line was irregular and was found at the gastroesophageal junction.      The entire examined stomach was normal.      The examined duodenum was normal. Impression:            - Z-line irregular, at the gastroesophageal junction.                        - Normal stomach.                        - Normal examined duodenum.                        - No specimens collected. Recommendation:        - Return patient to hospital ward for ongoing care.                        - Resume previous diet.                        - Continue present medications. Procedure Code(s):     --- Professional ---                        [phone], Esophagogastroduodenoscopy, flexible,                         transoral; diagnostic, including collection of                         specimen(s) by brushing or washing, when performed                         (separate procedure) Diagnosis Code(s):     --- Professional ---                        K92.0, Hematemesis CPT copyright 2019 American Medical Association. All rights reserved. The codes documented in this report are preliminary and upon coder review may  be revised to meet current compliance requirements. [name] MD, MD 01/11/2022 4:29:33 PM This report has been signed electronically. Number of  Addenda: 0 Note Initiated On: 01/11/2022 4:00 PM Estimated Blood Loss:  Estimated blood loss: none.      [facility]

## 2022-01-12 ENCOUNTER — Encounter: Payer: Self-pay | Admitting: Gastroenterology

## 2022-01-12 LAB — GLUCOSE, CAPILLARY: Glucose-Capillary: 164 mg/dL — ABNORMAL HIGH (ref 70–99)

## 2022-01-12 NOTE — Progress Notes (Signed)
Discharge instructions reviewed with patient. Questions and answers were addressed. PIV removed with no complications. Patient states he requests to wait in the lobby for his ride to come.

## 2022-01-12 NOTE — TOC Transition Note (Signed)
Transition of Care [facility]) - CM/SW Discharge Note   Patient Details  Name: Rogelio Waynick MRN: [id number] Date of Birth: 1955/11/22  Transition of Care [facility]) CM/SW Contact:  Beverly Sessions, RN Phone Number: 01/12/2022, 10:49 AM   Clinical Narrative:     Patient did not discharge yesterday.  Friend to pick up today Merleen Nicely with [facility] updated     Barriers to Discharge: Continued Medical Work up   Patient Goals and CMS Choice        Discharge Placement                       Discharge Plan and Services                                     Social Determinants of Health (SDOH) Interventions     Readmission Risk Interventions    12/01/2021   12:25 PM 08/07/2021    2:36 PM 06/28/2021    2:26 PM  Readmission Risk Prevention Plan  Transportation Screening Complete Complete Complete  Medication Review Press photographer) Complete Complete Complete  PCP or Specialist appointment within 3-5 days of discharge Complete Complete Complete  HRI or Home Care Consult Complete Complete Complete  SW Recovery Care/Counseling Consult Complete Complete Complete  Palliative Care Screening Complete Complete Not Applicable  [place] Complete Complete Not Applicable

## 2022-01-13 ENCOUNTER — Telehealth: Payer: Self-pay

## 2022-01-13 NOTE — Telephone Encounter (Signed)
Lvm to schedule hospital follow up w/ dfk-[name]

## 2022-01-14 ENCOUNTER — Telehealth: Payer: Self-pay

## 2022-01-14 NOTE — Telephone Encounter (Signed)
[facility] home [facility], [phone], pt has rescheduled appt for monday

## 2022-01-14 NOTE — Anesthesia Postprocedure Evaluation (Signed)
Anesthesia Post Note  Patient: Larry Callahan  Procedure(s) Performed: ESOPHAGOGASTRODUODENOSCOPY (EGD) WITH PROPOFOL  Patient location during evaluation: PACU Anesthesia Type: General Level of consciousness: awake and alert Pain management: pain level controlled Vital Signs Assessment: post-procedure vital signs reviewed and stable Respiratory status: spontaneous breathing, nonlabored ventilation and respiratory function stable Cardiovascular status: blood pressure returned to baseline and stable Postop Assessment: no apparent nausea or vomiting Anesthetic complications: no   No notable events documented.   Last Vitals:  Vitals:   01/12/22 0831 01/12/22 0844  BP: (!) 157/91   Pulse: 71 63  Resp: 16 18  Temp: (!) 36.4 C   SpO2: 100% 98%    Last Pain:  Vitals:   01/12/22 0831  TempSrc: Oral  PainSc:                  [place] Ouch

## 2022-01-14 NOTE — Telephone Encounter (Signed)
Left another vm to schedule hospital follow up-Larry Callahan

## 2022-01-17 ENCOUNTER — Telehealth: Payer: Self-pay

## 2022-01-17 ENCOUNTER — Ambulatory Visit: Payer: Self-pay | Admitting: Urology

## 2022-01-17 NOTE — Telephone Encounter (Signed)
Alden Benjamin from [facility] called and advised that there has been a few med changes when pt was in hospital on 01/03/22 and again 01/09/22.  Glimepiride was stopped on visit 01/09/22 Delene Loll was stopped by cardiology  On visit 01/03/22 they stopped the Amlodipine and pt has not had any swelling in legs since.  They also went up on his Eliquis to 5 mg.  Pt has appt with Dr Humphrey Rolls to go over all the hospital information on 01/18/22 at 2 pm

## 2022-01-18 ENCOUNTER — Ambulatory Visit: Payer: Medicare PPO | Admitting: Medical

## 2022-01-18 ENCOUNTER — Telehealth: Payer: Self-pay

## 2022-01-18 ENCOUNTER — Encounter: Payer: Self-pay | Admitting: Medical

## 2022-01-18 ENCOUNTER — Ambulatory Visit: Payer: Self-pay | Admitting: Urology

## 2022-01-18 ENCOUNTER — Telehealth (INDEPENDENT_AMBULATORY_CARE_PROVIDER_SITE_OTHER): Payer: Medicare PPO | Admitting: Internal Medicine

## 2022-01-18 ENCOUNTER — Telehealth: Payer: Medicare PPO | Admitting: Nurse Practitioner

## 2022-01-18 VITALS — BP 158/90 | HR 79 | Temp 97.8°F | Resp 18 | Ht 71.0 in | Wt 239.0 lb

## 2022-01-18 DIAGNOSIS — R4582 Worries: Secondary | ICD-10-CM

## 2022-01-18 DIAGNOSIS — I5022 Chronic systolic (congestive) heart failure: Secondary | ICD-10-CM | POA: Diagnosis not present

## 2022-01-18 DIAGNOSIS — E1122 Type 2 diabetes mellitus with diabetic chronic kidney disease: Secondary | ICD-10-CM

## 2022-01-18 DIAGNOSIS — N184 Chronic kidney disease, stage 4 (severe): Secondary | ICD-10-CM

## 2022-01-18 NOTE — Progress Notes (Signed)
[facility] [place], [place] [address]  Internal MEDICINE  Telephone Visit  Patient Name: Larry Callahan  [id number]  [id number]  Date of Service: 01/24/2022  I connected with the patient at 1010 by telephone and verified the patients identity using two identifiers.   I discussed the limitations, risks, security and privacy concerns of performing an evaluation and management service by telephone and the availability of in person appointments. I also discussed with the patient that there may be a patient responsible charge related to the service.  The patient expressed understanding and agrees to proceed.    Chief Complaint  Patient presents with   Telephone Assessment    [phone]   Telephone Screen   Hospitalization Follow-up    HPI Pt is connected via video visit for hospital follow up Pt has multiple medical problems along with social issues ( not having a car or unable to drive) and minimal understanding of his medical problems. He goes to ED frequently. Multiple calls are received by him or by his nurse for multiple complaints. Our office is unable to provide reassurance after listening to his complaints. He is unable to follow recommendation given by our office, he thinks that death might be imminent. Due to this reason he is over treated for some of his complaints  Recent admission for complaints of hematemesis lead to no definite diagnosis  His renal functions are worse due to over diuresis as well, Entresto was held  Amaryl was held due to risk of hypoglycemia   Today he is to his baseline, blood pressure is slightly elevated    Current Medication: Outpatient Encounter Medications as of 01/18/2022  Medication Sig   Accu-Chek Softclix Lancets lancets Use to check blood sugars 4 times a day   E11.65   apixaban (ELIQUIS) 5 MG TABS tablet Take 1 tablet (5 mg total) by mouth 2 (two) times daily.   Blood Glucose Monitoring Suppl (TRUE METRIX AIR  GLUCOSE METER) DEVI 1 Device by Does not apply route 2 (two) times daily.   Budeson-Glycopyrrol-Formoterol (BREZTRI AEROSPHERE) 160-9-4.8 MCG/ACT AERO Inhale into the lungs. Samples given.... 2 puffs in the morning and 2 puffs at bedtime   cetirizine (ZYRTEC) 10 MG tablet Take 10 mg by mouth daily as needed for allergies. Buys OTC   cyanocobalamin 1000 MCG tablet Take 1 tablet (1,000 mcg total) by mouth daily.   furosemide (LASIX) 40 MG tablet Take 1 tablet (40 mg total) by mouth daily. Take lasix 60m po daily   glucose blood (ACCU-CHEK GUIDE) test strip Use to check blood sugars 4 times a day   E11.65   hydrALAZINE (APRESOLINE) 100 MG tablet Take 100 mg by mouth 3 (three) times daily.   insulin glargine, 2 Unit Dial, (TOUJEO MAX SOLOSTAR) 300 UNIT/ML Solostar Pen Inject 20 units in am (Patient taking differently: Inject 30 units in am)   ipratropium-albuterol (DUONEB) 0.5-2.5 (3) MG/3ML SOLN Take 3 mLs by nebulization every 6 (six) hours as needed.   NOVOFINE PEN NEEDLE 32G X 6 MM MISC Use as directed   omeprazole (PRILOSEC) 40 MG capsule Take 1 capsule once in the morning and once in the evening   potassium chloride (KLOR-CON) 20 MEQ packet Take 20 mEq by mouth daily.   rosuvastatin (CRESTOR) 10 MG tablet Take 1 tablet (10 mg total) by mouth at bedtime.   [DISCONTINUED] potassium chloride (KLOR-CON M) 10 MEQ tablet Take by mouth daily.   carvedilol (COREG) 12.5 MG tablet Take 1 tablet (  12.5 mg total) by mouth 2 (two) times daily.   [DISCONTINUED] budesonide (PULMICORT) 0.25 MG/2ML nebulizer solution Take 2 mLs (0.25 mg total) by nebulization 2 (two) times daily. (Patient not taking: Reported on 01/18/2022)   [DISCONTINUED] TOUJEO SOLOSTAR 300 UNIT/ML Solostar Pen Inject 10 Units into the skin daily.   No facility-administered encounter medications on file as of 01/18/2022.    Surgical History: Past Surgical History:  Procedure Laterality Date   BACK SURGERY     CARDIAC CATHETERIZATION      CHOLECYSTECTOMY     COLONOSCOPY WITH PROPOFOL N/A 04/12/2019   Procedure: COLONOSCOPY WITH PROPOFOL;  Surgeon: [name], MD;  Location: [facility] ENDOSCOPY;  Service: Gastroenterology;  Laterality: N/A;   ESOPHAGOGASTRODUODENOSCOPY N/A 04/12/2019   Procedure: ESOPHAGOGASTRODUODENOSCOPY (EGD);  Surgeon: [name], MD;  Location: [facility] ENDOSCOPY;  Service: Gastroenterology;  Laterality: N/A;   ESOPHAGOGASTRODUODENOSCOPY (EGD) WITH PROPOFOL N/A 11/21/2019   Procedure: ESOPHAGOGASTRODUODENOSCOPY (EGD) WITH PROPOFOL;  Surgeon: [name], MD;  Location: [facility] ENDOSCOPY;  Service: Gastroenterology;  Laterality: N/A;   ESOPHAGOGASTRODUODENOSCOPY (EGD) WITH PROPOFOL N/A 01/11/2022   Procedure: ESOPHAGOGASTRODUODENOSCOPY (EGD) WITH PROPOFOL;  Surgeon: [name], MD;  Location: [name] ENDOSCOPY;  Service: Endoscopy;  Laterality: N/A;   FLEXIBLE BRONCHOSCOPY Bilateral 05/17/2019   Procedure: FLEXIBLE BRONCHOSCOPY;  Surgeon: [name], MD;  Location: [name] ORS;  Service: Pulmonary;  Laterality: Bilateral;   left arm surgery     nephrectomy Left    PARATHYROIDECTOMY     RIGHT HEART CATH N/A 03/29/2019   Procedure: RIGHT HEART CATH;  Surgeon: [name], MD;  Location: [facility];  Service: Cardiovascular;  Laterality: N/A;    Medical History: Past Medical History:  Diagnosis Date   Allergy    Atrial flutter ([place])    a. Dx 12/2020--CHA2DS2VASc = 5-->eliquis.   Chronic combined systolic (congestive) and diastolic (congestive) heart failure ([place])    a. 04/2019 Echo: EF 45-50%; b. 02/2019 Echo: EF 55-60%; c. 09/2020 Echo: EF 35-40%, glob HK. Nl RV size/fxn. Mild MR; d. 07/2021 Echo: EF 40-45%, mild LVH, low-nl RV fxn, no MR.   CKD (chronic kidney disease), stage III (HCC)    COPD (chronic obstructive pulmonary disease) ([place])    Coronary artery disease    a. 2011 Cath: nonobs dzs; b. 09/2020 MV: EF 38%. No signif ischemia. ? inf MI vs diaph attenuation. GI uptake  artifact.   Diabetes mellitus without complication (HCC)    GERD (gastroesophageal reflux disease)    History of CVA (cerebrovascular accident) 09/25/2020   Hyperlipidemia    Hypertension    Morbid (severe) obesity due to excess calories ([place]) 06/09/2017   Morbid obesity ([place])    NICM (nonischemic cardiomyopathy) ([place])    a. 04/2019 Echo: EF 45-50%; b. 02/2019 Echo: EF 55-60%; c. 09/2020 Echo: EF 35-40%, glob HK; d. 09/2020 MV: No ischemia. ? inf infarct vs attenuation; d. 07/2021 Echo: EF 40-45%.   OSA (obstructive sleep apnea)    Pneumonia due to COVID-19 virus 01/30/2020   Stage 3b chronic kidney disease ([place])    Syncope and collapse 11/16/2020    Family History: Family History  Problem Relation Age of Onset   Diabetes Mother        2003   Lung cancer Father    Diabetes Sister    Hypertension Sister    Heart disease Sister    Cancer Sister    Bone cancer Brother    Prostate cancer Neg Hx    Bladder Cancer Neg  Hx    Kidney cancer Neg Hx     Social History   Socioeconomic History   Marital status: Single    Spouse name: Not on file   Number of children: Not on file   Years of education: Not on file   Highest education level: Not on file  Occupational History   Not on file  Tobacco Use   Smoking status: Former    Types: Cigarettes   Smokeless tobacco: Former    Types: Nurse, children's Use: Never used  Substance and Sexual Activity   Alcohol use: No   Drug use: No   Sexual activity: Not Currently  Other Topics Concern   Not on file  Social History Narrative   Not on file   Social Determinants of Health   Financial Resource Strain: Low Risk  (05/12/2021)   Overall Financial Resource Strain (CARDIA)    Difficulty of Paying Living Expenses: Not very hard  Food Insecurity: Not on file  Transportation Needs: Not on file  Physical Activity: Not on file  Stress: Not on file  Social Connections: Not on file  Intimate Partner Violence: Not on file       Review of Systems  Constitutional:  Negative for fatigue and fever.  HENT:  Negative for congestion, mouth sores and postnasal drip.   Respiratory:  Negative for cough.   Cardiovascular:  Negative for chest pain.  Genitourinary:  Negative for flank pain.  Psychiatric/Behavioral: Negative.      Vital Signs: BP (!) 158/90   Pulse 79   Temp 97.8 F (36.6 C)   Resp 18   Ht _0  (1.803 m)   Wt 239 lb (108.4 kg)   SpO2 98%   BMI 33.33 kg/m    Observation/Objective:  Looked comfortable    Assessment/Plan: 1. Chronic systolic CHF (congestive heart failure) (HCC) Pt is followed by CHF clinic, Entesto is held. On all other medications  - Basic Metabolic Panel (BMET)  2. Stage 4 chronic kidney disease ([place]) Followed by nephrology  - CBC with Differential/Platelet  3. Diabetes mellitus with stage 4 chronic kidney disease (HCC) Continue Toujeo, will monitor  - Basic Metabolic Panel (BMET) - Hemoglobin A1c  4. Feeling worried Again reassurance is given today    General Counseling: treyshon buchanon understanding of the findings of today's phone visit and agrees with plan of treatment. I have discussed any further diagnostic evaluation that may be needed or ordered today. We also reviewed his medications today. he has been encouraged to call the office with any questions or concerns that should arise related to todays visit.    Orders Placed This Encounter  Procedures   Basic Metabolic Panel (BMET)   CBC with Differential/Platelet   Hemoglobin A1c    No orders of the defined types were placed in this encounter.   Time spent:45 Minutes    Dr Lavera Guise Internal medicine

## 2022-01-18 NOTE — Progress Notes (Deleted)
Cardiology Clinic Note   Patient Name: Larry Callahan Date of Encounter: 01/18/2022  Primary Care Provider:  Lavera Guise, MD Primary Cardiologist:  [name], MD  Patient Profile    66 year old male with a history of nonobstructive CAD by [facility] in 2011, chronic combined systolic and diastolic CHF, pulmonary hypertension, GKD stage III s/p left sided nephrectomy with baseline serum creatinine around 2, IDDM2, HTN, iron deficiency anemia, atrial flutter on chronic anticoagulation with apixaban, prior tobacco use, OSA on CPAP, and morbid obesity who presents for follow up on his nonobstructive CAD and chronic combined systolic and diastolic CHF.   Past Medical History    Past Medical History:  Diagnosis Date   Allergy    Atrial flutter ([place])    a. Dx 12/2020--CHA2DS2VASc = 5-->eliquis.   Chronic combined systolic (congestive) and diastolic (congestive) heart failure ([place])    a. 04/2019 Echo: EF 45-50%; b. 02/2019 Echo: EF 55-60%; c. 09/2020 Echo: EF 35-40%, glob HK. Nl RV size/fxn. Mild MR; d. 07/2021 Echo: EF 40-45%, mild LVH, low-nl RV fxn, no MR.   CKD (chronic kidney disease), stage III (HCC)    COPD (chronic obstructive pulmonary disease) ([place])    Coronary artery disease    a. 2011 Cath: nonobs dzs; b. 09/2020 MV: EF 38%. No signif ischemia. ? inf MI vs diaph attenuation. GI uptake artifact.   Diabetes mellitus without complication (HCC)    GERD (gastroesophageal reflux disease)    History of CVA (cerebrovascular accident) 09/25/2020   Hyperlipidemia    Hypertension    Morbid (severe) obesity due to excess calories ([place]) 06/09/2017   Morbid obesity ([place])    NICM (nonischemic cardiomyopathy) ([place])    a. 04/2019 Echo: EF 45-50%; b. 02/2019 Echo: EF 55-60%; c. 09/2020 Echo: EF 35-40%, glob HK; d. 09/2020 MV: No ischemia. ? inf infarct vs attenuation; d. 07/2021 Echo: EF 40-45%.   OSA (obstructive sleep apnea)    Pneumonia due to COVID-19 virus 01/30/2020   Stage 3b chronic  kidney disease ([place])    Syncope and collapse 11/16/2020   Past Surgical History:  Procedure Laterality Date   BACK SURGERY     CARDIAC CATHETERIZATION     CHOLECYSTECTOMY     COLONOSCOPY WITH PROPOFOL N/A 04/12/2019   Procedure: COLONOSCOPY WITH PROPOFOL;  Surgeon: [name], MD;  Location: [facility] ENDOSCOPY;  Service: Gastroenterology;  Laterality: N/A;   ESOPHAGOGASTRODUODENOSCOPY N/A 04/12/2019   Procedure: ESOPHAGOGASTRODUODENOSCOPY (EGD);  Surgeon: [name], MD;  Location: [facility] ENDOSCOPY;  Service: Gastroenterology;  Laterality: N/A;   ESOPHAGOGASTRODUODENOSCOPY (EGD) WITH PROPOFOL N/A 11/21/2019   Procedure: ESOPHAGOGASTRODUODENOSCOPY (EGD) WITH PROPOFOL;  Surgeon: [name], MD;  Location: [facility] ENDOSCOPY;  Service: Gastroenterology;  Laterality: N/A;   ESOPHAGOGASTRODUODENOSCOPY (EGD) WITH PROPOFOL N/A 01/11/2022   Procedure: ESOPHAGOGASTRODUODENOSCOPY (EGD) WITH PROPOFOL;  Surgeon: [name], MD;  Location: [name] ENDOSCOPY;  Service: Endoscopy;  Laterality: N/A;   FLEXIBLE BRONCHOSCOPY Bilateral 05/17/2019   Procedure: FLEXIBLE BRONCHOSCOPY;  Surgeon: [name], MD;  Location: [name] ORS;  Service: Pulmonary;  Laterality: Bilateral;   left arm surgery     nephrectomy Left    PARATHYROIDECTOMY     RIGHT HEART CATH N/A 03/29/2019   Procedure: RIGHT HEART CATH;  Surgeon: [name], MD;  Location: [facility];  Service: Cardiovascular;  Laterality: N/A;    Allergies  Allergies  Allergen Reactions   Penicillins Rash    History of Present Illness    66  year old male with a complex past medical history of nonobstructive CAD by [facility] in 2011, chronic combined systolic and diastolic CHF, pulmonary HTN, CKD stage III s/p left sided nephrectomy with baseline serum creatinine around 2, IDDM2, HTN, iron deficiency anemia, prior tobacco use, atrial flutter on chronic anticoagulation with apixaban, OSA on CPAP and morbid obesity.   He was  initially evaluated in 2020, after several emergency department visits, for persistent ongoing dyspnea with CTA of the chest being negative for PE with noted aortic atherosclerosis and coronary artery calcifications. Echo showed EF 55-60%, mild LVH, diastolic dysfunction, normal RV systolic function, and ventricular cavity size, severely dilated left atrium, trivial TR, and mild aortic valve sclerosis. Lexiscan MPI showed no significant ischemia and was read as high risk secondary to LVEF less than 30%.  Due to persistent symptoms he was again admitted in 2020 for VQ scan being low risk for PE.  RHC showed normal right heart pressures.  Symptoms were felt to be multifactorial including COPD, obesity, physical deconditioning, and anemia.  He was again readmitted in late 2020 with continued dyspnea.  Repeat echo showed EF 45 to 50%, mild LVH, normal RV systolic function and ventricular cavity size, mildly dilated left atrium and mild to moderate aortic valve sclerosis without stenosis.  In 7/22 was seen in the ED with palpitations and new onset A-fib which was initially managed with titration of beta-blocker initiation of Eliquis.  He subsequently developed rate controlled atrial flutter we recommended oral anticoagulant and outpatient DCCV, as medication compliance with his Eliquis was spotty at the time.  He continue to follow-up with heart failure clinic but did not have routine appointments with cardiology.  He has had several admissions this year, through the emergency department for various complaints; 08/05/2021 chest pain with shortness of breath, 09/16/2021 in the emergency department cardiology consulted, 09/21/2020 return to the emergency department for treatment of the laceration to his hand; 09/23/2021 evaluated in the emergency department for chest pain which in turn required admission; 11/30/2021 presents again to the emergency department for eye pain which required admission for periorbital cellulitis;  12/06/2021 who presented to the emergency department for medication refill after losing his antibiotics once he was discharged for periorbital cellulitis requesting a refill on doxycycline; 12/17/2021 evaluated in the emergency department for chest pain which required admission without ischemic evaluation warranted; 12/30/2021 return to the emergency department again for abdominal pain; 7/70/23 evaluated in the emergency department for hematemesis on being in persistent atrial flutter with chronic anticoagulant of apixaban and a history of esophagitis    Home Medications    Current Outpatient Medications  Medication Sig Dispense Refill   Accu-Chek Softclix Lancets lancets Use to check blood sugars 4 times a day   E11.65 400 each 3   apixaban (ELIQUIS) 5 MG TABS tablet Take 1 tablet (5 mg total) by mouth 2 (two) times daily. 60 tablet 0   Blood Glucose Monitoring Suppl (TRUE METRIX AIR GLUCOSE METER) DEVI 1 Device by Does not apply route 2 (two) times daily. 1 Device 0   Budeson-Glycopyrrol-Formoterol (BREZTRI AEROSPHERE) 160-9-4.8 MCG/ACT AERO Inhale into the lungs. Samples given.... 2 puffs in the morning and 2 puffs at bedtime     budesonide (PULMICORT) 0.25 MG/2ML nebulizer solution Take 2 mLs (0.25 mg total) by nebulization 2 (two) times daily. 60 mL 12   carvedilol (COREG) 12.5 MG tablet Take 1 tablet (12.5 mg total) by mouth 2 (two) times daily. 60 tablet 11   cetirizine (ZYRTEC) 10  MG tablet Take 10 mg by mouth daily as needed for allergies. Buys OTC     cyanocobalamin 1000 MCG tablet Take 1 tablet (1,000 mcg total) by mouth daily. 90 tablet 1   furosemide (LASIX) 40 MG tablet Take 1 tablet (40 mg total) by mouth daily. Take lasix 6m po daily 30 tablet 3   glucose blood (ACCU-CHEK GUIDE) test strip Use to check blood sugars 4 times a day   E11.65 400 each 3   hydrALAZINE (APRESOLINE) 100 MG tablet Take 100 mg by mouth 3 (three) times daily.     insulin glargine, 2 Unit Dial, (TOUJEO MAX  SOLOSTAR) 300 UNIT/ML Solostar Pen Inject 20 units in am 9 mL 3   ipratropium-albuterol (DUONEB) 0.5-2.5 (3) MG/3ML SOLN Take 3 mLs by nebulization every 6 (six) hours as needed. 270 mL 3   NOVOFINE PEN NEEDLE 32G X 6 MM MISC Use as directed 100 each 0   omeprazole (PRILOSEC) 40 MG capsule Take 1 capsule once in the morning and once in the evening 180 capsule 1   potassium chloride (KLOR-CON M) 10 MEQ tablet Take 10 mEq by mouth daily.     rosuvastatin (CRESTOR) 10 MG tablet Take 1 tablet (10 mg total) by mouth at bedtime. 90 tablet 1   TOUJEO SOLOSTAR 300 UNIT/ML Solostar Pen Inject 10 Units into the skin daily.     No current facility-administered medications for this visit.     Family History    Family History  Problem Relation Age of Onset   Diabetes Mother        2003   Lung cancer Father    Diabetes Sister    Hypertension Sister    Heart disease Sister    Cancer Sister    Bone cancer Brother    He indicated that his mother is deceased. He indicated that his father is deceased. He indicated that the status of his sister is unknown. He indicated that the status of his brother is unknown.  Social History    Social History   Socioeconomic History   Marital status: Single    Spouse name: Not on file   Number of children: Not on file   Years of education: Not on file   Highest education level: Not on file  Occupational History   Not on file  Tobacco Use   Smoking status: Former    Types: Cigarettes   Smokeless tobacco: Former    Types: CNurse, children'sUse: Never used  Substance and Sexual Activity   Alcohol use: No   Drug use: No   Sexual activity: Not Currently  Other Topics Concern   Not on file  Social History Narrative   Not on file   Social Determinants of Health   Financial Resource Strain: Low Risk  (05/12/2021)   Overall Financial Resource Strain (CARDIA)    Difficulty of Paying Living Expenses: Not very hard  Food Insecurity: Not on file   Transportation Needs: Not on file  Physical Activity: Not on file  Stress: Not on file  Social Connections: Not on file  Intimate Partner Violence: Not on file     Review of Systems    General:  No chills, fever, night sweats or weight changes.  Cardiovascular:  No chest pain, dyspnea on exertion, edema, orthopnea, palpitations, paroxysmal nocturnal dyspnea. Dermatological: No rash, lesions/masses Respiratory: No cough, dyspnea Urologic: No hematuria, dysuria Abdominal:   No nausea, vomiting, diarrhea, bright red blood per rectum,  melena, or hematemesis Neurologic:  No visual changes, wkns, changes in mental status. All other systems reviewed and are otherwise negative except as noted above.     Physical Exam    VS:  There were no vitals taken for this visit. , BMI There is no height or weight on file to calculate BMI.     GEN: Well nourished, well developed, in no acute distress. HEENT: normal. Neck: Supple, no JVD, carotid bruits, or masses. Cardiac: RRR, no murmurs, rubs, or gallops. No clubbing, cyanosis, edema.  Radials/DP/PT 2+ and equal bilaterally.  Respiratory:  Respirations regular and unlabored, clear to auscultation bilaterally. GI: Soft, nontender, nondistended, BS + x 4. MS: no deformity or atrophy. Skin: warm and dry, no rash. Neuro:  Strength and sensation are intact. Psych: Normal affect.  Accessory Clinical Findings    ECG personally reviewed by me today- *** - No acute changes  Lab Results  Component Value Date   WBC 6.3 01/10/2022   HGB 11.0 (L) 01/11/2022   HCT 34.8 (L) 01/11/2022   MCV 85.6 01/10/2022   PLT 179 01/10/2022   Lab Results  Component Value Date   CREATININE 2.50 (H) 01/11/2022   BUN 35 (H) 01/11/2022   NA 138 01/11/2022   K 4.2 01/11/2022   CL 111 01/11/2022   CO2 23 01/11/2022   Lab Results  Component Value Date   ALT 16 01/09/2022   AST 20 01/09/2022   ALKPHOS 80 01/09/2022   BILITOT 0.9 01/09/2022   Lab Results   Component Value Date   CHOL 131 10/07/2020   HDL 40 (L) 10/07/2020   LDLCALC 63 10/07/2020   TRIG 142 10/07/2020   CHOLHDL 3.3 10/07/2020    Lab Results  Component Value Date   HGBA1C 7.0 (H) 08/06/2021    Assessment & Plan   1.  ***  Emmelyn Schmale, NP 01/18/2022, 7:38 AM

## 2022-01-18 NOTE — Telephone Encounter (Signed)
Lvm to schedule next follow up with [name]

## 2022-01-19 ENCOUNTER — Ambulatory Visit: Payer: Medicare PPO | Admitting: Urology

## 2022-01-19 ENCOUNTER — Encounter: Payer: Self-pay | Admitting: Urology

## 2022-01-19 ENCOUNTER — Ambulatory Visit: Payer: Self-pay | Admitting: Urology

## 2022-01-19 VITALS — BP 163/82 | HR 73 | Ht 71.0 in | Wt 244.0 lb

## 2022-01-19 DIAGNOSIS — Z905 Acquired absence of kidney: Secondary | ICD-10-CM | POA: Diagnosis not present

## 2022-01-19 DIAGNOSIS — N184 Chronic kidney disease, stage 4 (severe): Secondary | ICD-10-CM

## 2022-01-19 DIAGNOSIS — E278 Other specified disorders of adrenal gland: Secondary | ICD-10-CM

## 2022-01-19 DIAGNOSIS — N2889 Other specified disorders of kidney and ureter: Secondary | ICD-10-CM | POA: Diagnosis not present

## 2022-01-19 NOTE — Progress Notes (Signed)
01/19/22 8:18 AM   Larry Callahan 1955/08/13 [id number]  Referring provider:  Lavera Guise, MD [address] [place],  [place] [address] Chief Complaint  Patient presents with   New Patient (Initial Visit)    Renal Lesion    HPI: Larry Callahan is a 66 y.o.male for further evaluation of renal lesion.   He was admitted into the hospital on 01/03/2022 presented with abdominal pain. CMP showed creatinine of 2.30 which was improved from earlier 2.5 level. CBC showed white count of 6.1 hemoglobin of 11.3 platelets of 193.UA had more than 300 rare bacteria, 0- 5 WBCs and 0-5 RBCS. CT abdomen and pelvis without contrast revealed interval stable increase in size of a complex indeterminate 4.7 X 5 cm  right inferior renal pole lesion. Left kidney is surgically absent.   He has had innumerable admissions for various things primarily cardiac in nature.  Extensive chart review was performed of his most recent admissions over the past year.  Most recent admission was secondary to hematemesis, preceded by admission for CHF.  He is unsure why he has a surgically absent kidney. He reports that it was due to ?cancer "but it didn't make it in his bloodstream".  He believes this may have been performed by Dr. Redmond Baseman.  He has a left open nephrectomy scar.  He also has a symptomatic abdominal wall hernia.  PMH: Past Medical History:  Diagnosis Date   Allergy    Atrial flutter ([name])    a. Dx 12/2020--CHA2DS2VASc = 5-->eliquis.   Chronic combined systolic (congestive) and diastolic (congestive) heart failure ([place])    a. 04/2019 Echo: EF 45-50%; b. 02/2019 Echo: EF 55-60%; c. 09/2020 Echo: EF 35-40%, glob HK. Nl RV size/fxn. Mild MR; d. 07/2021 Echo: EF 40-45%, mild LVH, low-nl RV fxn, no MR.   CKD (chronic kidney disease), stage III (HCC)    COPD (chronic obstructive pulmonary disease) ([place])    Coronary artery disease    a. 2011 Cath: nonobs dzs; b. 09/2020 MV: EF 38%. No signif ischemia. ?  inf MI vs diaph attenuation. GI uptake artifact.   Diabetes mellitus without complication (HCC)    GERD (gastroesophageal reflux disease)    History of CVA (cerebrovascular accident) 09/25/2020   Hyperlipidemia    Hypertension    Morbid (severe) obesity due to excess calories ([place]) 06/09/2017   Morbid obesity ([place])    NICM (nonischemic cardiomyopathy) ([place])    a. 04/2019 Echo: EF 45-50%; b. 02/2019 Echo: EF 55-60%; c. 09/2020 Echo: EF 35-40%, glob HK; d. 09/2020 MV: No ischemia. ? inf infarct vs attenuation; d. 07/2021 Echo: EF 40-45%.   OSA (obstructive sleep apnea)    Pneumonia due to COVID-19 virus 01/30/2020   Stage 3b chronic kidney disease ([place])    Syncope and collapse 11/16/2020    Surgical History: Past Surgical History:  Procedure Laterality Date   BACK SURGERY     CARDIAC CATHETERIZATION     CHOLECYSTECTOMY     COLONOSCOPY WITH PROPOFOL N/A 04/12/2019   Procedure: COLONOSCOPY WITH PROPOFOL;  Surgeon: [name], MD;  Location: [facility] ENDOSCOPY;  Service: Gastroenterology;  Laterality: N/A;   ESOPHAGOGASTRODUODENOSCOPY N/A 04/12/2019   Procedure: ESOPHAGOGASTRODUODENOSCOPY (EGD);  Surgeon: [name], MD;  Location[facility]COPY;  Service: Gastroenterology;  Laterality: N/A;   ESOPHAGOGASTRODUODENOSCOPY (EGD) WITH PROPOFOL N/A 11/21/2019   Procedure: ESOPHAGOGASTRODUODENOSCOPY (EGD) WITH PROPOFOL;  Surgeon: [name], MD;  Location: [facility] ENDOSCOPY;  Service: Gastroenterology;  Laterality: N/A;   ESOPHAGOGASTRODUODENOSCOPY (  EGD) WITH PROPOFOL N/A 01/11/2022   Procedure: ESOPHAGOGASTRODUODENOSCOPY (EGD) WITH PROPOFOL;  Surgeon: [name], MD;  Location: [facility] ENDOSCOPY;  Service: Endoscopy;  Laterality: N/A;   FLEXIBLE BRONCHOSCOPY Bilateral 05/17/2019   Procedure: FLEXIBLE BRONCHOSCOPY;  Surgeon: [name], MD;  Location: [name] ORS;  Service: Pulmonary;  Laterality: Bilateral;   left arm surgery     nephrectomy Left    PARATHYROIDECTOMY     RIGHT HEART  CATH N/A 03/29/2019   Procedure: RIGHT HEART CATH;  Surgeon: [name], MD;  Location: [place] CV LAB;  Service: Cardiovascular;  Laterality: N/A;    Home Medications:  Allergies as of 01/19/2022       Reactions   Penicillins Rash        Medication List        Accurate as of January 19, 2022 11:59 PM. If you have any questions, ask your nurse or doctor.          Accu-Chek Guide test strip Generic drug: glucose blood Use to check blood sugars 4 times a day   E11.65   Accu-Chek Softclix Lancets lancets Use to check blood sugars 4 times a day   E11.65   apixaban 5 MG Tabs tablet Commonly known as: ELIQUIS Take 1 tablet (5 mg total) by mouth 2 (two) times daily.   Breztri Aerosphere 160-9-4.8 MCG/ACT Aero Generic drug: Budeson-Glycopyrrol-Formoterol Inhale into the lungs. Samples given.... 2 puffs in the morning and 2 puffs at bedtime   carvedilol 12.5 MG tablet Commonly known as: Coreg Take 1 tablet (12.5 mg total) by mouth 2 (two) times daily.   cetirizine 10 MG tablet Commonly known as: ZYRTEC Take 10 mg by mouth daily as needed for allergies. Buys OTC   cyanocobalamin 1000 MCG tablet Take 1 tablet (1,000 mcg total) by mouth daily.   furosemide 40 MG tablet Commonly known as: LASIX Take 1 tablet (40 mg total) by mouth daily. Take lasix 97m po daily   hydrALAZINE 100 MG tablet Commonly known as: APRESOLINE Take 100 mg by mouth 3 (three) times daily.   ipratropium-albuterol 0.5-2.5 (3) MG/3ML Soln Commonly known as: DUONEB Take 3 mLs by nebulization every 6 (six) hours as needed.   Novofine Pen Needle 32G X 6 MM Misc Generic drug: Insulin Pen Needle Use as directed   omeprazole 40 MG capsule Commonly known as: PRILOSEC Take 1 capsule once in the morning and once in the evening   potassium chloride 20 MEQ packet Commonly known as: KLOR-CON Take 20 mEq by mouth daily.   rosuvastatin 10 MG tablet Commonly known as: CRESTOR Take 1 tablet  (10 mg total) by mouth at bedtime.   Toujeo Max SoloStar 300 UNIT/ML Solostar Pen Generic drug: insulin glargine (2 Unit Dial) Inject 20 units in am What changed: additional instructions   True Metrix Air Glucose Meter Devi 1 Device by Does not apply route 2 (two) times daily.        Allergies:  Allergies  Allergen Reactions   Penicillins Rash    Family History: Family History  Problem Relation Age of Onset   Diabetes Mother        2003   Lung cancer Father    Diabetes Sister    Hypertension Sister    Heart disease Sister    Cancer Sister    Bone cancer Brother    Prostate cancer Neg Hx    Bladder Cancer Neg Hx    Kidney cancer Neg Hx     Social  History:  reports that he has quit smoking. His smoking use included cigarettes. He has quit using smokeless tobacco.  His smokeless tobacco use included chew. He reports that he does not drink alcohol and does not use drugs.   Physical Exam: BP (!) 163/82   Pulse 73   Ht _0  (1.803 m)   Wt 244 lb (110.7 kg)   BMI 34.03 kg/m   Constitutional:  Alert and oriented, No acute distress. HEENT: [place] AT, moist mucus membranes.  Trachea midline, no masses. Cardiovascular: No clubbing, cyanosis, or edema. Respiratory: Normal respiratory effort, no increased work of breathing. GI: Hernia noted Large left open nephrectomy scar  Skin: No rashes, bruises or suspicious lesions. Neurologic: Grossly intact, no focal deficits, moving all 4 extremities. Psychiatric: Normal mood and affect.  Laboratory Data:  Lab Results  Component Value Date   CREATININE 2.50 (H) 01/11/2022   Lab Results  Component Value Date   HGBA1C 7.0 (H) 08/06/2021    Pertinent Imaging: CLINICAL DATA:  Hernia, complicated. Pt to ED for stabbing lower abdominal pain more to R side where has hernia. 9/10 pain   EXAM: CT ABDOMEN AND PELVIS WITHOUT CONTRAST   TECHNIQUE: Multidetector CT imaging of the abdomen and pelvis was performed following the  standard protocol without IV contrast.   RADIATION DOSE REDUCTION: This exam was performed according to the departmental dose-optimization program which includes automated exposure control, adjustment of the mA and/or kV according to patient size and/or use of iterative reconstruction technique.   COMPARISON:  CT abdomen pelvis 11/30/2020   FINDINGS: Lower chest: Query enlarged atria bilaterally. Cardiac changes suggestive of anemia. No acute abnormality.   Hepatobiliary: No focal liver abnormality. Status post cholecystectomy. No biliary dilatation.   Pancreas: No focal lesion. Normal pancreatic contour. No surrounding inflammatory changes. No main pancreatic ductal dilatation.   Spleen: Multiple calcifications sick stress chin of sequelae of prior granulomatous disease. Otherwise normal in size without focal abnormality. Splenule noted.   Adrenals/Urinary Tract:   No adrenal nodule bilaterally.   No nephrolithiasis and no hydronephrosis. Bilateral for dense lesions likely represent simple renal cysts. Simple renal cysts, in the absence of clinically indicated signs/symptoms, require no independent follow-up. Interval increase in size of a heterogeneous appearance 4.7 x 5 cm right inferior renal pole lesion with associated mural nodularity.   No ureterolithiasis or hydroureter.   The urinary bladder is unremarkable.   Stomach/Bowel: Stomach is within normal limits. No evidence of bowel wall thickening or dilatation. Colonic diverticulosis. Appendix appears normal.   Vascular/Lymphatic: No abdominal aorta or iliac aneurysm. Severe atherosclerotic plaque of the aorta and its branches. No abdominal, pelvic, or inguinal lymphadenopathy.   Reproductive: Prominent prostate.   Other: No intraperitoneal free fluid. No intraperitoneal free gas. No organized fluid collection.   Musculoskeletal:   Stable small to moderate volume fat containing umbilical hernia with an  abdominal defect of 2 cm.   No suspicious lytic or blastic osseous lesions. No acute displaced fracture. Multilevel degenerative changes of the spine.   IMPRESSION: 1. Interval stable increase in size of a complex indeterminate 4.7 x 5 cm right inferior renal pole lesion concerning for malignancy/renal cell carcinoma. Markedly limited evaluation on this noncontrast study. When the patient is clinically stable and able to follow directions and hold their breath (preferably as an outpatient) further evaluation with dedicated abdominal MRI renal protocol should be considered. 2. Colonic diverticulosis with no acute diverticulitis. 3. Stable small to moderate volume fat containing umbilical hernia. 4.  Cardiomegaly. 5. Findings suggestive of anemia. 6.  Aortic Atherosclerosis (ICD10-I70.0).     Electronically Signed   By: Iven Finn M.D.   On: 01/03/2022 16:34   I have personally reviewed the images and agree with radiologist interpretation.  I also personally compared this to previous CTs.  Right renal lesion has been slowly enlarging over several years.  There is no obvious lymphadenopathy.  All previous studies performed without contrast secondary to significant CKD.  There is also a large mass in his left adrenal gland which has been seen for several years.  This looks primarily cystic in nature but again not characterized with the absence of contrast.  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  He understands this He wanted to have surgery.  He would adamantly declined dialysis.  Any surgical intervention would likely result in this.  Lesion is likely too large for ablative therapy. - Will plan for MRI in 6 monthsul to  get an MRI with and without contrast in 6 months.  2.  Stage IV CKD -Complicating factor likely exacerbated by medical conditions and solitary kidney  3. 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  Conley Rolls as a Education administrator for Hollice Espy, MD.,have documented all relevant documentation on the behalf of Hollice Espy, MD,as directed by  Hollice Espy, MD while in the presence of Hollice Espy, MD.  I spent 62 total minutes on the day of the encounter including pre-visit review of the medical record, face-to-face time with the patient, and post visit ordering of labs/imaging/tests.  The majority of time was spent reviewing his chart extensively along with serial imaging.  Very complex patient.   [place] [address], [place], [place] [address] [phone]

## 2022-01-20 ENCOUNTER — Telehealth: Payer: Self-pay

## 2022-01-20 ENCOUNTER — Other Ambulatory Visit: Payer: Self-pay

## 2022-01-20 ENCOUNTER — Emergency Department: Payer: Medicare PPO

## 2022-01-20 ENCOUNTER — Emergency Department
Admission: EM | Admit: 2022-01-20 | Discharge: 2022-01-21 | Disposition: A | Discharge status: Home / Self Care | Payer: Medicare PPO | Attending: Emergency Medicine | Admitting: Emergency Medicine

## 2022-01-20 DIAGNOSIS — R319 Hematuria, unspecified: Secondary | ICD-10-CM | POA: Insufficient documentation

## 2022-01-20 DIAGNOSIS — R0789 Other chest pain: Secondary | ICD-10-CM

## 2022-01-20 DIAGNOSIS — I129 Hypertensive chronic kidney disease with stage 1 through stage 4 chronic kidney disease, or unspecified chronic kidney disease: Secondary | ICD-10-CM | POA: Diagnosis not present

## 2022-01-20 DIAGNOSIS — I4892 Unspecified atrial flutter: Secondary | ICD-10-CM | POA: Diagnosis not present

## 2022-01-20 DIAGNOSIS — N189 Chronic kidney disease, unspecified: Secondary | ICD-10-CM | POA: Diagnosis not present

## 2022-01-20 DIAGNOSIS — R079 Chest pain, unspecified: Secondary | ICD-10-CM | POA: Diagnosis not present

## 2022-01-20 DIAGNOSIS — I10 Essential (primary) hypertension: Secondary | ICD-10-CM | POA: Diagnosis not present

## 2022-01-20 DIAGNOSIS — I509 Heart failure, unspecified: Secondary | ICD-10-CM | POA: Insufficient documentation

## 2022-01-20 DIAGNOSIS — M25519 Pain in unspecified shoulder: Secondary | ICD-10-CM | POA: Diagnosis not present

## 2022-01-20 LAB — URINALYSIS, ROUTINE W REFLEX MICROSCOPIC
Bacteria, UA: NONE SEEN
Bilirubin Urine: NEGATIVE
Glucose, UA: 50 mg/dL — AB
Ketones, ur: NEGATIVE mg/dL
Leukocytes,Ua: NEGATIVE
Nitrite: NEGATIVE
Protein, ur: 100 mg/dL — AB
RBC / HPF: >50 RBC/hpf — ABNORMAL HIGH (ref 0–5)
Specific Gravity, Urine: 1.013 (ref 1.005–1.030)
pH: 5 (ref 5.0–8.0)

## 2022-01-20 LAB — BASIC METABOLIC PANEL
Anion gap: 6 (ref 5–15)
BUN: 50 mg/dL — ABNORMAL HIGH (ref 8–23)
CO2: 20 mmol/L — ABNORMAL LOW (ref 22–32)
Calcium: 8.8 mg/dL — ABNORMAL LOW (ref 8.9–10.3)
Chloride: 114 mmol/L — ABNORMAL HIGH (ref 98–111)
Creatinine, Ser: 2.79 mg/dL — ABNORMAL HIGH (ref 0.61–1.24)
GFR, Estimated: 24 mL/min — ABNORMAL LOW (ref >60)
Glucose, Bld: 155 mg/dL — ABNORMAL HIGH (ref 70–99)
Potassium: 4.1 mmol/L (ref 3.5–5.1)
Sodium: 140 mmol/L (ref 135–145)

## 2022-01-20 LAB — CBC
HCT: 35.6 % — ABNORMAL LOW (ref 39.0–52.0)
Hemoglobin: 11.3 g/dL — ABNORMAL LOW (ref 13.0–17.0)
MCH: 27.2 pg (ref 26.0–34.0)
MCHC: 31.7 g/dL (ref 30.0–36.0)
MCV: 85.8 fL (ref 80.0–100.0)
Platelets: 200 10*3/uL (ref 150–400)
RBC: 4.15 MIL/uL — ABNORMAL LOW (ref 4.22–5.81)
RDW: 15 % (ref 11.5–15.5)
WBC: 7.8 10*3/uL (ref 4.0–10.5)
nRBC: 0 % (ref 0.0–0.2)

## 2022-01-20 LAB — TROPONIN I (HIGH SENSITIVITY): Troponin I (High Sensitivity): 13 ng/L (ref <18)

## 2022-01-20 NOTE — ED Provider Triage Note (Signed)
Emergency Medicine Provider Triage Evaluation Note  Larry Callahan , a 66 y.o. male  was evaluated in triage.  Pt complains of chest pain, pressure tightness and diaphoresis that began earlier today.  Patient also thinks he saw some blood in his urine..  Review of Systems  Positive: Chest pain/tightness, possible hematuria Negative: Fever, cough, shortness of breath  Physical Exam  There were no vitals taken for this visit. Gen:   Awake, no distress   Resp:  Normal effort  MSK:   Moves extremities without difficulty  Other:    Medical Decision Making  Medically screening exam initiated at 7:00 PM.  Appropriate orders placed.  Larry Callahan was informed that the remainder of the evaluation will be completed by another provider, this initial triage assessment does not replace that evaluation, and the importance of remaining in the ED until their evaluation is complete.     Duanne Guess, [place] 01/20/22 1908

## 2022-01-20 NOTE — Telephone Encounter (Signed)
Glucose 185 and bp 160/130 pt was not sure his machine working right

## 2022-01-20 NOTE — Telephone Encounter (Signed)
Spoke with pt again and advised him if his Bp high he can take half of hydralazine but he said he was nor sure about Bp and he was having trouble breathing advised him to call 911 Asap  if he is having trouble breathing and he said his sister will take him to ED

## 2022-01-20 NOTE — ED Triage Notes (Signed)
Pt coming from home today with EMS for CP today. Pt stating they believe they have blood in urine. Pt took albuterol  treatment. Pt sitting in wheelchair NAD noted.

## 2022-01-21 LAB — TROPONIN I (HIGH SENSITIVITY): Troponin I (High Sensitivity): 12 ng/L (ref <18)

## 2022-01-21 MED ORDER — SODIUM CHLORIDE 0.9 % IV BOLUS
500.0000 mL | Freq: Once | INTRAVENOUS | Status: AC
  Administered 2022-01-21: 500 mL via INTRAVENOUS

## 2022-01-21 NOTE — ED Provider Notes (Signed)
[facility] Provider Note    Event Date/Time   First MD Initiated Contact with Patient 01/20/22 2359     (approximate)   History   Chest Pain   HPI  Larry Callahan is a 66 y.o. male with extensive past medical history that includes but is not limited to atrial flutter on Eliquis, 7 hospitalizations within the last 6 months and 10 ED visits.  He has intermittent chest pain and history of chronic kidney disease, CHF, COPD, demand ischemia leading to chest pain, etc.  He most recently was hospitalized just under 2 weeks ago for concern of hematemesis but had a reassuring endoscopy and evaluation by GI.  Dr. Clayborn Bigness is his PCP.  He presents tonight for evaluation of chest pain.  He said that he has been having intermittent sharp stabbing chest pain.  He currently has no pain.  He said that sometimes he can get up and mow the lawn and work outside with no trouble but then other times this will happen.  The pain today happened while he was at rest.  He is not feeling more short of breath than usual.  He has not had any recent fevers.  He thought he might of seen some blood in his urine but he is having no pain or increased frequency.  He always has a little bit of pain at the site of his umbilical hernia but no pain worse than usual.  He has had no recent vomiting or diarrhea.  He has no numbness or weakness in his extremities.  \  He said that he called his primary care office and the nurse on the phone told him he should call 911 and go right to the hospital.      Physical Exam   Triage Vital Signs: ED Triage Vitals  Enc Vitals Group     BP 01/20/22 1908 (!) 150/90     Pulse Rate 01/20/22 1908 72     Resp 01/20/22 1908 16     Temp 01/20/22 1908 98 F (36.7 C)     Temp src --      SpO2 01/20/22 1908 96 %     Weight 01/20/22 1905 116.1 kg (256 lb)     Height --      Head Circumference --      Peak Flow --      Pain Score 01/20/22 1905 6     Pain  Loc --      Pain Edu? --      Excl. in [place]? --     Most recent vital signs: Vitals:   01/21/22 0054 01/21/22 0155  BP:  (!) 142/92  Pulse: 70   Resp: 14 16  Temp:  98.3 F (36.8 C)  SpO2: 97% 99%     General: Awake, no distress.  Appears chronically ill but not in acute distress. CV:  Good peripheral perfusion.  Normal heart sounds.  Regular rhythm and rate. Resp:  Normal effort.  Lungs are clear to auscultation bilaterally.  The patient is speaking easily and comfortably with no distress or accessory muscle usage. Abd:  No distention.  No tenderness to palpation.  He is obese and has the previously documented small umbilical hernia, but it is soft and easily reducible and nontender. Other:  Patient is a little bit anxious about his chronic medical issues but showing no emergency psychiatric signs or symptoms that would warrant involuntary commitment or psychiatric consultation.   ED Results /  Procedures / Treatments   Labs (all labs ordered are listed, but only abnormal results are displayed) Labs Reviewed  BASIC METABOLIC PANEL - Abnormal; Notable for the following components:      Result Value   Chloride 114 (*)    CO2 20 (*)    Glucose, Bld 155 (*)    BUN 50 (*)    Creatinine, Ser 2.79 (*)    Calcium 8.8 (*)    GFR, Estimated 24 (*)    All other components within normal limits  CBC - Abnormal; Notable for the following components:   RBC 4.15 (*)    Hemoglobin 11.3 (*)    HCT 35.6 (*)    All other components within normal limits  URINALYSIS, ROUTINE W REFLEX MICROSCOPIC - Abnormal; Notable for the following components:   Color, Urine YELLOW (*)    APPearance CLEAR (*)    Glucose, UA 50 (*)    Hgb urine dipstick MODERATE (*)    Protein, ur 100 (*)    RBC / HPF >50 (*)    All other components within normal limits  TROPONIN I (HIGH SENSITIVITY)  TROPONIN I (HIGH SENSITIVITY)     EKG  ED ECG REPORT I, Hinda Kehr, the attending physician, personally viewed  and interpreted this ECG.  Date: 01/20/2022 EKG Time: 19: 00 Rate: 74 Rhythm: Atrial flutter with variable AV block QRS Axis: normal Intervals: normal ST/T Wave abnormalities: Non-specific ST segment / T-wave changes, but no clear evidence of acute ischemia. Narrative Interpretation: no definitive evidence of acute ischemia; does not meet STEMI criteria.    RADIOLOGY I viewed and interpreted the patient's two-view chest x-ray.  There is no acute infiltrates, evidence of pneumothorax, or evidence of pulmonary edema.  I viewed and interpreted the patient's    PROCEDURES:  Critical Care performed: No  .1-3 Lead EKG Interpretation  Performed by: Hinda Kehr, MD Authorized by: Hinda Kehr, MD     Interpretation: abnormal     ECG rate:  78   ECG rate assessment: normal     Rhythm: atrial flutter     Ectopy: none     Conduction: normal      MEDICATIONS ORDERED IN ED: Medications  sodium chloride 0.9 % bolus 500 mL (0 mLs Intravenous Stopped 01/21/22 0057)     IMPRESSION / MDM / ASSESSMENT AND PLAN / ED COURSE  I reviewed the triage vital signs and the nursing notes.                              Differential diagnosis includes, but is not limited to, angina, ACS including unstable angina, pneumonia, pneumothorax, PE, anxiety, metabolic or electrolyte abnormality.  Patient's presentation is most consistent with acute presentation with potential threat to life or bodily function.  Given the patient's chronic medical issues, chest pain could certainly represent an acute life-threatening illness.  However in this case, his workup has been reassuring.  Labs/studies ordered include EKG, two-view chest x-ray, high-sensitivity troponin x 2, basic metabolic panel, CBC, urinalysis (the patient also complained of possible blood in his urine).  Patient has chronic atrial flutter but no evidence of ischemia.  His basic metabolic panel is notable for a slight worsening of his  chronic kidney disease now with a creatinine of 2.79, but given the degree of chronic kidney disease, this has minimal impact on his overall GFR.  I ordered normal saline 500 mL IV bolus because he appears  to be slightly dehydrated but the degree of change from prior does not warrant admission to the hospital, particularly given that he is eating and drinking normally per his own report.    I reviewed his most recent discharge summary from 01/11/2022, written by Dr. Kurtis Bushman at [facility].  There is some concern about his frequent admissions for a variety of symptoms.  He has also been seen by multiple specialists and seems to be stable from prior with multiple outpatient opportunities for follow-up.  I initially considered hospitalization given his comorbidities, but given the overall reassuring workup, the recent hospitalizations with reassuring workups, no significant or acute changes tonight that warrant admission, and access to close outpatient follow-up, I do not believe that he would benefit from hospitalization at this time.  The patient states he agrees, and states that he did not want to come, but was just doing what he was told over the phone.  After we repeat a troponin to make sure it is not changing, I feel that he will be appropriate for discharge and outpatient follow-up.  The patient says he understands and agrees with the plan.  The patient is on the cardiac monitor to evaluate for evidence of arrhythmia and/or significant heart rate changes.  Clinical Course as of 01/21/22 0719  Fri Jan 21, 2022  0140 Troponin I (High Sensitivity): 12 Fortunately the patient's repeat high-sensitivity troponin is within normal limits.  He is having no chest pain.  As per my previous note, he will be discharged for outpatient follow-up.  I gave my usual and customary return precautions. [CF]    Clinical Course User Index [CF] Hinda Kehr, MD     FINAL CLINICAL IMPRESSION(S) / ED DIAGNOSES   Final  diagnoses:  Atypical chest pain  Chronic atrial flutter (HCC)  Hematuria, unspecified type  Chronic kidney disease, unspecified CKD stage     Rx / DC Orders   ED Discharge Orders          Ordered    AMB referral to CHF clinic       Comments: Existing patient, needs close follow up given gradually worsening renal function and co-morbidities   01/21/22 0149             Note:  This document was prepared using Dragon voice recognition software and may include unintentional dictation errors.   Hinda Kehr, MD 01/21/22 [phone]

## 2022-01-21 NOTE — Discharge Instructions (Signed)
Although you have many chronic medical issues, including your kidney function which seems to be gradually getting worse over time, you have nothing that we will keep you in the hospital tonight.  We recommend you continue taking your regular medications, but please follow-up with your primary care doctor at the next available opportunity.  You also need to follow-up with Darylene Price in the heart failure clinic.  They should be reaching out to you to schedule a follow-up appointment.  Please schedule the next available follow-up appointments with both clinics.    Return to the emergency department if you develop new or worsening symptoms that concern you.

## 2022-01-24 NOTE — Progress Notes (Unsigned)
Patient ID: Larry Callahan, male    DOB: 1956/05/28, 66 y.o.   MRN: [id number]  HPI  Larry Callahan is a 66 yo male with a PMHx of nonischemic cardiomyopathy last EF measured in April 2022 was 29 to 40%, COPD, sleep apnea, chronic kidney disease stage IV, chronic anemia, hypertension, and diabetes mellitus.  Echo from 08/08/21 reviewed and showed and EF of 40-45%. There is mild to moderately decreased function of the left ventricle. The diastolic parameters are indeterminate. The RV systolic function is low normal.   RHC done 03/29/2019 and showed: Pulmonary capillary wedge pressure mean of 8 PA: 32/16 mean 23 RA mean of 10 RV 26/12/13  Cardiac output 8.17 Cardiac index 3.42 Final Conclusions:   Normal right heart pressures  Was in the ED 01/20/22 due to atypical chest pain where he was evaluated and released. Admitted 01/09/22 due to  nausea vomiting and noticing bright red blood in his vomit.  He also reported some mild shortness of breath, generalized weakness dizziness. Palliative care and GI consults obtained. EGD done.   Discharged after 3 days. Admitted 01/03/22 due to abdominal pain. Was given morphine with subsequent dizziness. CT abdomen with umbilical hernia with no obstruction and concern of enlarging right renal complex cyst which need further investigation. Surgical consult obtained. Discharged the next day. Admitted 12/17/21 due to acute on chronic heart failure. Discharged after 3 days. Was in the ED 12/06/21 due to needing antibiotic refill as he lost previous prescription. Admitted 11/30/21 due to swelling and pain around the right eye. Ophthamology consult obtained. Started on IV antibiotics due to periorbital cellulitis and then transitioned to oral antibiotics. Discharged after 4 days. Admitted 09/23/21 due to weakness and left-sided chest pain. PT/OT evaluations done. Cardiology consult obtained. Troponins unremarkable. Discharged after 2 days. Was in the ED 09/21/21 due to hand  laceration. Sutures placed and he was released. Admitted 09/16/21 due to sudden chest pain. Cardiology consult obtained. NSTEMI ruled out. Furosemide decreased to daily with stoppage of chlorthalidone. Discharged in 2 days. Admitted 2/23-2/26/23 with worsening SOB, and orthopnea, diuresed 4.9 liters and ultimately d/c'd home with [facility] RN services.   He presents for a follow-up visit with a chief complaint of minimal fatigue upon moderate exertion. Describes this as chronic in nature. He has associated shortness of breath and anxiety along with this. He denies any difficulty sleeping, abdominal distention, palpitations, pedal edema, chest pain, wheezing, cough or dizziness.   Had been weighing himself but has to get new batteries in his scale. Says that he's almost out of eliquis but he has called his pharmacy who is requesting a refill from his cardiology office.   Kristi from [employer] me and said she has been trying to reach patient and has left messages but no return call. He says that he lost the paper with her phone number on it.   Past Medical History:  Diagnosis Date   Allergy    Atrial flutter ([place])    a. Dx 12/2020--CHA2DS2VASc = 5-->eliquis.   Chronic combined systolic (congestive) and diastolic (congestive) heart failure ([place])    a. 04/2019 Echo: EF 45-50%; b. 02/2019 Echo: EF 55-60%; c. 09/2020 Echo: EF 35-40%, glob HK. Nl RV size/fxn. Mild MR; d. 07/2021 Echo: EF 40-45%, mild LVH, low-nl RV fxn, no MR.   CKD (chronic kidney disease), stage III (HCC)    COPD (chronic obstructive pulmonary disease) ([place])    Coronary artery disease    a. 2011 Cath: nonobs dzs;  b. 09/2020 MV: EF 38%. No signif ischemia. ? inf MI vs diaph attenuation. GI uptake artifact.   Diabetes mellitus without complication (HCC)    GERD (gastroesophageal reflux disease)    History of CVA (cerebrovascular accident) 09/25/2020   Hyperlipidemia    Hypertension    Morbid (severe) obesity due to excess  calories ([place]) 06/09/2017   Morbid obesity ([place])    NICM (nonischemic cardiomyopathy) ([place])    a. 04/2019 Echo: EF 45-50%; b. 02/2019 Echo: EF 55-60%; c. 09/2020 Echo: EF 35-40%, glob HK; d. 09/2020 MV: No ischemia. ? inf infarct vs attenuation; d. 07/2021 Echo: EF 40-45%.   OSA (obstructive sleep apnea)    Pneumonia due to COVID-19 virus 01/30/2020   Stage 3b chronic kidney disease ([place])    Syncope and collapse 11/16/2020   Past Surgical History:  Procedure Laterality Date   BACK SURGERY     CARDIAC CATHETERIZATION     CHOLECYSTECTOMY     COLONOSCOPY WITH PROPOFOL N/A 04/12/2019   Procedure: COLONOSCOPY WITH PROPOFOL;  Surgeon: [name], MD;  Location: [facility] ENDOSCOPY;  Service: Gastroenterology;  Laterality: N/A;   ESOPHAGOGASTRODUODENOSCOPY N/A 04/12/2019   Procedure: ESOPHAGOGASTRODUODENOSCOPY (EGD);  Surgeon: [name], MD;  Location[facility] ENDOSCOPY;  Service: Gastroenterology;  Laterality: N/A;   ESOPHAGOGASTRODUODENOSCOPY (EGD) WITH PROPOFOL N/A 11/21/2019   Procedure: ESOPHAGOGASTRODUODENOSCOPY (EGD) WITH PROPOFOL;  Surgeon: [name], MD;  Location: [facility] ENDOSCOPY;  Service: Gastroenterology;  Laterality: N/A;   ESOPHAGOGASTRODUODENOSCOPY (EGD) WITH PROPOFOL N/A 01/11/2022   Procedure: ESOPHAGOGASTRODUODENOSCOPY (EGD) WITH PROPOFOL;  Surgeon: [name], MD;  Location: [name] ENDOSCOPY;  Service: Endoscopy;  Laterality: N/A;   FLEXIBLE BRONCHOSCOPY Bilateral 05/17/2019   Procedure: FLEXIBLE BRONCHOSCOPY;  Surgeon: [name], MD;  Location: [name] ORS;  Service: Pulmonary;  Laterality: Bilateral;   left arm surgery     nephrectomy Left    PARATHYROIDECTOMY     RIGHT HEART CATH N/A 03/29/2019   Procedure: RIGHT HEART CATH;  Surgeon: [name], MD;  Location: [place] CV LAB;  Service: Cardiovascular;  Laterality: N/A;   Family History  Problem Relation Age of Onset   Diabetes Mother        2003   Lung cancer Father    Diabetes Sister     Hypertension Sister    Heart disease Sister    Cancer Sister    Bone cancer Brother    Prostate cancer Neg Hx    Bladder Cancer Neg Hx    Kidney cancer Neg Hx    Social History   Tobacco Use   Smoking status: Former    Types: Cigarettes   Smokeless tobacco: Former    Types: Chew  Substance Use Topics   Alcohol use: No   Allergies  Allergen Reactions   Penicillins Rash   Prior to Admission medications   Medication Sig Start Date End Date Taking? Authorizing Provider  Accu-Chek Softclix Lancets lancets Use to check blood sugars 4 times a day   E11.65 08/20/21  Yes Abernathy, Alyssa, NP  apixaban (ELIQUIS) 5 MG TABS tablet Take 1 tablet (5 mg total) by mouth 2 (two) times daily. 12/20/21  Yes Nolberto Hanlon, MD  Blood Glucose Monitoring Suppl (TRUE METRIX AIR GLUCOSE METER) DEVI 1 Device by Does not apply route 2 (two) times daily. 04/13/18  Yes Boscia, Greer Ee, NP  Budeson-Glycopyrrol-Formoterol (BREZTRI AEROSPHERE) 160-9-4.8 MCG/ACT AERO Inhale into the lungs. Samples given.... 2 puffs in the morning and 2 puffs at bedtime   Yes [provider]  carvedilol (COREG) 12.5 MG tablet Take 1 tablet (12.5 mg total) by mouth 2 (two) times daily. 01/07/21  Yes Arta Silence, MD  cetirizine (ZYRTEC) 10 MG tablet Take 10 mg by mouth daily as needed for allergies. Buys OTC   Yes [provider]  cyanocobalamin 1000 MCG tablet Take 1 tablet (1,000 mcg total) by mouth daily. 12/13/21  Yes Lavera Guise, MD  furosemide (LASIX) 40 MG tablet Take 1 tablet (40 mg total) by mouth daily. Take lasix 40m po daily 12/20/21  Yes Amery, SGwynneth Albright MD  glucose blood (ACCU-CHEK GUIDE) test strip Use to check blood sugars 4 times a day   E11.65 12/13/21  Yes KLavera Guise MD  hydrALAZINE (APRESOLINE) 100 MG tablet Take 100 mg by mouth 3 (three) times daily. 12/30/21  Yes [provider]  insulin glargine, 2 Unit Dial, (TOUJEO MAX SOLOSTAR) 300 UNIT/ML Solostar Pen Inject 20 units in  am Patient taking differently: Inject 30 units in am 11/02/21  Yes KLavera Guise MD  ipratropium-albuterol (DUONEB) 0.5-2.5 (3) MG/3ML SOLN Take 3 mLs by nebulization every 6 (six) hours as needed. 06/29/21  Yes Danford, CSuann Larry MD  NOVOFINE PEN NEEDLE 32G X 6 MM MISC Use as directed 12/12/21  Yes KLavera Guise MD  omeprazole (PRILOSEC) 40 MG capsule Take 1 capsule once in the morning and once in the evening 12/13/21  Yes KLavera Guise MD  potassium chloride (KLOR-CON) 20 MEQ packet Take 20 mEq by mouth daily.   Yes [provider]  rosuvastatin (CRESTOR) 10 MG tablet Take 1 tablet (10 mg total) by mouth at bedtime. 08/11/21  Yes AJonetta Osgood NP   Review of Systems  Constitutional:  Positive for fatigue. Negative for appetite change.  HENT:  Positive for hearing loss. Negative for congestion and sore throat.   Eyes: Negative.   Respiratory:  Positive for shortness of breath. Negative for cough and wheezing.   Cardiovascular:  Negative for chest pain, palpitations and leg swelling.  Gastrointestinal:  Negative for abdominal distention and abdominal pain.  Endocrine: Negative.   Genitourinary: Negative.   Musculoskeletal:  Negative for back pain and neck pain.  Skin: Negative.   Allergic/Immunologic: Negative.   Neurological:  Negative for dizziness and light-headedness.  Hematological:  Negative for adenopathy. Does not bruise/bleed easily.  Psychiatric/Behavioral:  Negative for dysphoric mood and sleep disturbance (sleeping on 1 pillow with CPAP). The patient is nervous/anxious.    Vitals:   01/25/22 1043  BP: (!) 160/79  Pulse: 68  Resp: 16  SpO2: 98%  Weight: 242 lb 4 oz (109.9 kg)  Height: _0  (1.803 m)   Wt Readings from Last 3 Encounters:  01/25/22 242 lb 4 oz (109.9 kg)  01/20/22 256 lb (116.1 kg)  01/19/22 244 lb (110.7 kg)   Lab Results  Component Value Date   CREATININE 2.79 (H) 01/20/2022   CREATININE 2.50 (H) 01/11/2022   CREATININE 2.37 (H)  01/10/2022   Physical Exam Vitals and nursing note reviewed.  Constitutional:      Appearance: Normal appearance.  HENT:     Head: Normocephalic and atraumatic.  Cardiovascular:     Rate and Rhythm: Normal rate and regular rhythm.  Pulmonary:     Effort: Pulmonary effort is normal. No respiratory distress.     Breath sounds: No wheezing or rales.  Abdominal:     General: There is no distension.     Palpations: Abdomen is soft.  Musculoskeletal:  General: No tenderness.     Cervical back: Normal range of motion and neck supple.     Right lower leg: No edema.     Left lower leg: No edema.  Skin:    General: Skin is warm and dry.  Neurological:     General: No focal deficit present.     Mental Status: He is alert and oriented to person, place, and time.  Psychiatric:        Mood and Affect: Mood is anxious.        Behavior: Behavior normal.    Assessment and Plan:  Chronic Heart failure with reduced ejection fraction- - NYHA II - euvolemic today - weighing daily; reminded to call for an overnight weight gain of > 2 pounds or a weekly weight gain of > 5 pounds - weight down 9 pounds from last visit here 2 weeks ago - not adding salt to his food - participating in paramedicine program although she hasn't been able to reach him; wrote on his AVS her phone number and emphasized that he call her - saw cardiology Rockey Situ) 11/11/20 - on GDMT coreg  - renal function may limit other GDMT; may need nephrology referral - BNP 12/17/21 was 122.8  2. HTN with CKD- - BP elevated (160/79) but he is anxious and talking throughout the entire visit - had video visit with PCP Stephanie Coup) 01/18/22 - BMP 01/20/22 reviewed and showed Na 140, K 4.1, Cr 2.79, gfr 24  3. DM-  - A1C 7.0% 08/06/21 - nonfasting glucose at home today was 187  4: Lymphedema- - resolved  5. OSA- - wears CPAP with O2 HS, for "all hours I sleep, including naps"   Medication bottles reviewed.   Return in 2 months,  sooner if needed.

## 2022-01-25 ENCOUNTER — Ambulatory Visit: Payer: Medicare PPO | Attending: Family | Admitting: Family

## 2022-01-25 ENCOUNTER — Encounter: Payer: Self-pay | Admitting: Family

## 2022-01-25 VITALS — BP 160/79 | HR 68 | Resp 16 | Ht 71.0 in | Wt 242.2 lb

## 2022-01-25 DIAGNOSIS — I5020 Unspecified systolic (congestive) heart failure: Secondary | ICD-10-CM | POA: Diagnosis not present

## 2022-01-25 DIAGNOSIS — E1122 Type 2 diabetes mellitus with diabetic chronic kidney disease: Secondary | ICD-10-CM | POA: Insufficient documentation

## 2022-01-25 DIAGNOSIS — Z79899 Other long term (current) drug therapy: Secondary | ICD-10-CM | POA: Insufficient documentation

## 2022-01-25 DIAGNOSIS — G4733 Obstructive sleep apnea (adult) (pediatric): Secondary | ICD-10-CM | POA: Diagnosis not present

## 2022-01-25 DIAGNOSIS — I428 Other cardiomyopathies: Secondary | ICD-10-CM | POA: Diagnosis not present

## 2022-01-25 DIAGNOSIS — D539 Nutritional anemia, unspecified: Secondary | ICD-10-CM | POA: Insufficient documentation

## 2022-01-25 DIAGNOSIS — Z794 Long term (current) use of insulin: Secondary | ICD-10-CM | POA: Diagnosis not present

## 2022-01-25 DIAGNOSIS — I890 Lymphedema, not elsewhere classified: Secondary | ICD-10-CM | POA: Insufficient documentation

## 2022-01-25 DIAGNOSIS — J449 Chronic obstructive pulmonary disease, unspecified: Secondary | ICD-10-CM | POA: Diagnosis not present

## 2022-01-25 DIAGNOSIS — I130 Hypertensive heart and chronic kidney disease with heart failure and stage 1 through stage 4 chronic kidney disease, or unspecified chronic kidney disease: Secondary | ICD-10-CM | POA: Diagnosis not present

## 2022-01-25 DIAGNOSIS — N184 Chronic kidney disease, stage 4 (severe): Secondary | ICD-10-CM | POA: Insufficient documentation

## 2022-01-25 DIAGNOSIS — I5022 Chronic systolic (congestive) heart failure: Secondary | ICD-10-CM | POA: Insufficient documentation

## 2022-01-25 DIAGNOSIS — I10 Essential (primary) hypertension: Secondary | ICD-10-CM | POA: Diagnosis not present

## 2022-01-25 NOTE — Patient Instructions (Addendum)
Continue weighing daily and call for an overnight weight gain of 3 pounds or more or a weekly weight gain of more than 5 pounds.   If you have voicemail, please make sure your mailbox is cleaned out so that we may leave a message and please make sure to listen to any voicemails.    Kristi (paramedic) has been trying to reach you and has left you messages. Please call her at [phone]

## 2022-01-26 ENCOUNTER — Other Ambulatory Visit: Payer: Self-pay | Admitting: Family

## 2022-01-26 ENCOUNTER — Telehealth: Payer: Self-pay | Admitting: *Deleted

## 2022-01-26 ENCOUNTER — Ambulatory Visit: Payer: Medicare PPO | Admitting: Family

## 2022-01-26 MED ORDER — APIXABAN 5 MG PO TABS: 5.0000 mg | ORAL_TABLET | Freq: Two times a day (BID) | ORAL | 0 refills | Status: DC

## 2022-01-26 NOTE — Telephone Encounter (Signed)
        Patient  visited [facility] on 01/21/2022  for Chest pain    Telephone encounter attempt :  !st  A HIPAA compliant voice message was left requesting a return call.  Instructed patient to call back at [phone].  [place] [phone] [address] [place] , [place] [address] Email : [email or web address]

## 2022-01-28 DIAGNOSIS — Z7901 Long term (current) use of anticoagulants: Secondary | ICD-10-CM | POA: Diagnosis not present

## 2022-01-28 DIAGNOSIS — E1142 Type 2 diabetes mellitus with diabetic polyneuropathy: Secondary | ICD-10-CM | POA: Diagnosis not present

## 2022-01-28 DIAGNOSIS — J449 Chronic obstructive pulmonary disease, unspecified: Secondary | ICD-10-CM | POA: Diagnosis not present

## 2022-01-28 DIAGNOSIS — I5042 Chronic combined systolic (congestive) and diastolic (congestive) heart failure: Secondary | ICD-10-CM | POA: Diagnosis not present

## 2022-02-01 ENCOUNTER — Telehealth (HOSPITAL_COMMUNITY): Payer: Self-pay

## 2022-02-01 NOTE — Telephone Encounter (Signed)
Have attempted several times to reach Larry Callahan.  Unable to reach again today, unavailable to leave a message.  Will continue to try to reach.   [place] [phone]

## 2022-02-04 ENCOUNTER — Ambulatory Visit: Payer: Medicare PPO | Admitting: Medical

## 2022-02-04 ENCOUNTER — Other Ambulatory Visit: Payer: Self-pay

## 2022-02-04 ENCOUNTER — Telehealth: Payer: Self-pay

## 2022-02-04 MED ORDER — GLIMEPIRIDE 2 MG PO TABS: 2.0000 mg | ORAL_TABLET | Freq: Every day | ORAL | 0 refills | Status: DC

## 2022-02-04 NOTE — Telephone Encounter (Signed)
Pt called that his glucose 252 not fasting as per dr Humphrey Rolls add glimepiride 2 mg and advised him if glucose  more than 400 go to ED and keep log for glucose  and call us back how he is doing

## 2022-02-08 NOTE — Progress Notes (Unsigned)
Cardiology Office Note  Date:  02/09/2022   ID:  [name], [name] 12/24/1955, MRN [id number]  PCP:  Lavera Guise, MD   Chief Complaint  Patient presents with   [facility] follow up     "Doing well." Medications reviewed by the patient verbally.     HPI:  Larry Callahan is a 66 y.o. male with a hx of  morbid obesity,  hypertension poorly controlled,  type 2 diabetes  smoker,  chronic renal insufficiency, CR >2 single kidney,  obstructive sleep apnea on CPAP,    presenting to the emergency room September 2020 with shortness of breath on exertion Chronic chest pain Atrial flutter, first noted January 07, 2021, persistent since that time EF 40 to 45% Presents for routine follow-up of his new shortness of breath, diastolic dysfunction  LOV 3/21 BP elevated today Confused on medications Visiting Nurse does his meds, pill box  He denies any active chest pain Was recently seen in the emergency room January 20, 2022 for chest pain  symptoms, nurse at primary care office told him to go to the ER for his symptoms,  discharged home after negative work-up They detailed "7 hospitalizations within the last 6 months and 10 ED visits. " Frequent trips to the emergency room for various issues  hospitalized just under 2 weeks ago for concern of hematemesis but had a reassuring endoscopy and evaluation by GI.  On Eliquis 5 twice daily for persistent atrial flutter Asymptomatic from his flutter Echocardiogram February 2023 ejection fraction 40 to 45%  Sister that usually helps him is in nursing facility with dementia  Denies significant shortness of breath on exertion  Lab work reviewed CR >2 HBA1C 7.0  Quit smoking summer 2020  EKG personally reviewed by myself on todays visit Atrial flutter ventricular rate 71 bpm no significant ST-T wave changes  Other past medical history reviewed left heart catheterization 2011 Nonobstructive disease at that time Nondominant  RCA  Diagnosed with hypertensive heart disease with diastolic CHF, underlying COPD given long history of smoking  morbid obesity, BNP 322 Treated with IV Lasix   CT scan chest with no PE   significant coronary calcification through the LAD Unable to exclude underlying ischemia, unable several risk factors including poorly controlled diabetes --Stress test performed showing no ischemia   PMH:   has a past medical history of Allergy, Atrial flutter ([place]), Chronic combined systolic (congestive) and diastolic (congestive) heart failure ([place]), CKD (chronic kidney disease), stage III ([place]), COPD (chronic obstructive pulmonary disease) ([place]), Coronary artery disease, Diabetes mellitus without complication ([place]), GERD (gastroesophageal reflux disease), History of CVA (cerebrovascular accident) (09/25/2020), Hyperlipidemia, Hypertension, Morbid (severe) obesity due to excess calories ([place]) (06/09/2017), Morbid obesity ([place]), NICM (nonischemic cardiomyopathy) ([place]), OSA (obstructive sleep apnea), Pneumonia due to COVID-19 virus (01/30/2020), Stage 3b chronic kidney disease ([place]), and Syncope and collapse (11/16/2020).  PSH:    Past Surgical History:  Procedure Laterality Date   BACK SURGERY     CARDIAC CATHETERIZATION     CHOLECYSTECTOMY     COLONOSCOPY WITH PROPOFOL N/A 04/12/2019   Procedure: COLONOSCOPY WITH PROPOFOL;  Surgeon: [name], MD;  Location: [facility] ENDOSCOPY;  Service: Gastroenterology;  Laterality: N/A;   ESOPHAGOGASTRODUODENOSCOPY N/A 04/12/2019   Procedure: ESOPHAGOGASTRODUODENOSCOPY (EGD);  Surgeon: [name], MD;  Location: [facility] ENDOSCOPY;  Service: Gastroenterology;  Laterality: N/A;   ESOPHAGOGASTRODUODENOSCOPY (EGD) WITH PROPOFOL N/A 11/21/2019   Procedure: ESOPHAGOGASTRODUODENOSCOPY (EGD) WITH PROPOFOL;  Surgeon: [name], MD;  Location: [place];  Service: Gastroenterology;  Laterality: N/A;   ESOPHAGOGASTRODUODENOSCOPY (EGD) WITH PROPOFOL N/A  01/11/2022   Procedure: ESOPHAGOGASTRODUODENOSCOPY (EGD) WITH PROPOFOL;  Surgeon: [name], MD;  Location: [name] ENDOSCOPY;  Service: Endoscopy;  Laterality: N/A;   FLEXIBLE BRONCHOSCOPY Bilateral 05/17/2019   Procedure: FLEXIBLE BRONCHOSCOPY;  Surgeon: [name], MD;  Location: [name] ORS;  Service: Pulmonary;  Laterality: Bilateral;   left arm surgery     nephrectomy Left    PARATHYROIDECTOMY     RIGHT HEART CATH N/A 03/29/2019   Procedure: RIGHT HEART CATH;  Surgeon: [name], MD;  Location: [place] CV LAB;  Service: Cardiovascular;  Laterality: N/A;    Current Outpatient Medications  Medication Sig Dispense Refill   apixaban (ELIQUIS) 5 MG TABS tablet Take 1 tablet (5 mg total) by mouth 2 (two) times daily. Must make appt with Dr. Rockey Situ for refills 60 tablet 0   Budeson-Glycopyrrol-Formoterol (BREZTRI AEROSPHERE) 160-9-4.8 MCG/ACT AERO Inhale into the lungs. Samples given.... 2 puffs in the morning and 2 puffs at bedtime     carvedilol (COREG) 12.5 MG tablet Take 1 tablet (12.5 mg total) by mouth 2 (two) times daily. 60 tablet 11   cetirizine (ZYRTEC) 10 MG tablet Take 10 mg by mouth daily as needed for allergies. Buys OTC     cyanocobalamin 1000 MCG tablet Take 1 tablet (1,000 mcg total) by mouth daily. 90 tablet 1   furosemide (LASIX) 40 MG tablet Take 1 tablet (40 mg total) by mouth daily. Take lasix 22m po daily 30 tablet 3   glimepiride (AMARYL) 2 MG tablet Take 1 tablet (2 mg total) by mouth daily. 30 tablet 0   glucose blood (ACCU-CHEK GUIDE) test strip Use to check blood sugars 4 times a day   E11.65 400 each 3   hydrALAZINE (APRESOLINE) 100 MG tablet Take 100 mg by mouth 3 (three) times daily.     insulin glargine, 2 Unit Dial, (TOUJEO MAX SOLOSTAR) 300 UNIT/ML Solostar Pen Inject 20 units in am (Patient taking differently: Inject 30 units in am) 9 mL 3   ipratropium-albuterol (DUONEB) 0.5-2.5 (3) MG/3ML SOLN Take 3 mLs by nebulization every 6 (six) hours as  needed. 270 mL 3   NOVOFINE PEN NEEDLE 32G X 6 MM MISC Use as directed 100 each 0   omeprazole (PRILOSEC) 40 MG capsule Take 1 capsule once in the morning and once in the evening 180 capsule 1   potassium chloride (KLOR-CON) 20 MEQ packet Take 20 mEq by mouth daily.     rosuvastatin (CRESTOR) 10 MG tablet Take 1 tablet (10 mg total) by mouth at bedtime. 90 tablet 1   Accu-Chek Softclix Lancets lancets Use to check blood sugars 4 times a day   E11.65 (Patient not taking: Reported on 02/09/2022) 400 each 3   Blood Glucose Monitoring Suppl (TRUE METRIX AIR GLUCOSE METER) DEVI 1 Device by Does not apply route 2 (two) times daily. (Patient not taking: Reported on 02/09/2022) 1 Device 0   No current facility-administered medications for this visit.     Allergies:   Penicillins   Social History:  The patient  reports that he has quit smoking. His smoking use included cigarettes. He has quit using smokeless tobacco.  His smokeless tobacco use included chew. He reports that he does not drink alcohol and does not use drugs.   Family History:   family history includes Bone cancer in his brother; Cancer in his sister; Diabetes in his mother and sister; Heart disease in his  sister; Hypertension in his sister; Lung cancer in his father.    Review of Systems: Review of Systems  Constitutional: Negative.   HENT: Negative.    Respiratory: Negative.    Cardiovascular: Negative.   Gastrointestinal: Negative.   Musculoskeletal: Negative.   Neurological: Negative.   Psychiatric/Behavioral: Negative.    All other systems reviewed and are negative.    PHYSICAL EXAM: VS:  BP (!) 150/90 (BP Location: Left Arm, Patient Position: Sitting, Cuff Size: Normal)   Pulse 71   Ht _0  (1.803 m)   Wt 241 lb 2 oz (109.4 kg)   SpO2 97%   BMI 33.63 kg/m  , BMI Body mass index is 33.63 kg/m. Constitutional:  oriented to person, place, and time. No distress.  HENT:  Head: Grossly normal Eyes:  no discharge. No  scleral icterus.  Neck: No JVD, no carotid bruits  Cardiovascular: Regular rate and rhythm, no murmurs appreciated Pulmonary/Chest: Clear to auscultation bilaterally, no wheezes or rails Abdominal: Soft.  no distension.  no tenderness.  Musculoskeletal: Normal range of motion Neurological:  normal muscle tone. Coordination normal. No atrophy Skin: Skin warm and dry Psychiatric: normal affect, pleasant  Recent Labs: 09/25/2021: Magnesium 2.0 12/17/2021: B Natriuretic Peptide 122.8 01/09/2022: ALT 16 01/20/2022: BUN 50; Creatinine, Ser 2.79; Hemoglobin 11.3; Platelets 200; Potassium 4.1; Sodium 140    Lipid Panel Lab Results  Component Value Date   CHOL 131 10/07/2020   HDL 40 (L) 10/07/2020   LDLCALC 63 10/07/2020   TRIG 142 10/07/2020      Wt Readings from Last 3 Encounters:  02/09/22 241 lb 2 oz (109.4 kg)  01/25/22 242 lb 4 oz (109.9 kg)  01/20/22 256 lb (116.1 kg)       ASSESSMENT AND PLAN:  Problem List Items Addressed This Visit       Cardiology Problems   Essential hypertension   Atherosclerotic heart disease of native coronary artery without angina pectoris   Mixed hyperlipidemia   Other Visit Diagnoses     HFrEF (heart failure with reduced ejection fraction) (HCC)    -  Primary   Type 2 diabetes mellitus with stage 4 chronic kidney disease, with long-term current use of insulin (HCC)       Primary hypertension       OSA (obstructive sleep apnea)       Nonischemic cardiomyopathy (HCC)         Diastolic and systolic CHF, hypertensive heart disease Appears relatively euvolemic, EF 40 to 45% No medication changes made  Essential hypertension Discussed blood pressure medications with him, stressed medication compliance Recommended close monitoring of his blood pressure at home and call us with some numbers He reports visiting nurse does his pillbox  CAD Chronic chest pain symptoms Prior stress test April 2022 Trips to the emergency room since that  time We will order repeat Myoview to rule out ischemia No active chest pain on today's visit  Type 2 diabetes Managed by primary care Diet poor, weight running high   Total encounter time more than 30 minutes  Greater than 50% was spent in counseling and coordination of care with the patient    Signed, Esmond Plants, M.D., Ph.D. [place], [place]

## 2022-02-09 ENCOUNTER — Encounter: Payer: Self-pay | Admitting: Cardiovascular Disease

## 2022-02-09 ENCOUNTER — Ambulatory Visit: Payer: Medicare PPO | Attending: Cardiovascular Disease | Admitting: Cardiovascular Disease

## 2022-02-09 VITALS — BP 150/90 | HR 71 | Ht 71.0 in | Wt 241.1 lb

## 2022-02-09 DIAGNOSIS — Z794 Long term (current) use of insulin: Secondary | ICD-10-CM | POA: Diagnosis not present

## 2022-02-09 DIAGNOSIS — I209 Angina pectoris, unspecified: Secondary | ICD-10-CM

## 2022-02-09 DIAGNOSIS — I483 Typical atrial flutter: Secondary | ICD-10-CM | POA: Diagnosis not present

## 2022-02-09 DIAGNOSIS — E782 Mixed hyperlipidemia: Secondary | ICD-10-CM

## 2022-02-09 DIAGNOSIS — I10 Essential (primary) hypertension: Secondary | ICD-10-CM | POA: Diagnosis not present

## 2022-02-09 DIAGNOSIS — I5020 Unspecified systolic (congestive) heart failure: Secondary | ICD-10-CM

## 2022-02-09 DIAGNOSIS — I428 Other cardiomyopathies: Secondary | ICD-10-CM | POA: Diagnosis not present

## 2022-02-09 DIAGNOSIS — N184 Chronic kidney disease, stage 4 (severe): Secondary | ICD-10-CM

## 2022-02-09 DIAGNOSIS — G4733 Obstructive sleep apnea (adult) (pediatric): Secondary | ICD-10-CM

## 2022-02-09 DIAGNOSIS — I2510 Atherosclerotic heart disease of native coronary artery without angina pectoris: Secondary | ICD-10-CM

## 2022-02-09 DIAGNOSIS — E1122 Type 2 diabetes mellitus with diabetic chronic kidney disease: Secondary | ICD-10-CM

## 2022-02-09 DIAGNOSIS — R079 Chest pain, unspecified: Secondary | ICD-10-CM

## 2022-02-09 MED ORDER — APIXABAN 5 MG PO TABS: 5.0000 mg | ORAL_TABLET | Freq: Two times a day (BID) | ORAL | 3 refills | Status: DC

## 2022-02-09 NOTE — Patient Instructions (Addendum)
Larry Callahan for angina  Medication Instructions:  No changes  If you need a refill on your cardiac medications before your next appointment, please [facility] your pharmacy.   Lab work: No new labs needed  Testing/Procedures: Your physician has requested that you have a lexiscan myoview. For further information please visit [email or web address]. Please follow instruction sheet, as given.   Follow-Up: At [facility], you and your health needs are our priority.  As part of our continuing mission to provide you with exceptional heart care, we have created designated Provider Care Teams.  These Care Teams include your primary Cardiologist (physician) and Advanced Practice Providers (APPs -  Physician Assistants and Nurse Practitioners) who all work together to provide you with the care you need, when you need it.  You will need a follow up appointment in 12 months  Providers on your designated Care Team:   Murray Hodgkins, NP Christell Faith, PA-C Cadence Kathlen Mody, PA-C   [place] caregiver has ordered a Stress Test with nuclear imaging. The purpose of this test is to evaluate the blood supply to your heart muscle. This procedure is referred to as a "Non-Invasive Stress Test." This is because other than having an IV started in your vein, nothing is inserted or "invades" your body. Cardiac stress tests are done to find areas of poor blood flow to the heart by determining the extent of coronary artery disease (CAD). Some patients exercise on a treadmill, which naturally increases the blood flow to your heart, while others who are  unable to walk on a treadmill due to physical limitations have a pharmacologic/chemical stress agent called Lexiscan . This medicine will mimic walking on a treadmill by temporarily increasing your coronary blood flow.   Please note: these test may take anywhere between 2-4 hours to complete  PLEASE REPORT TO [place] AT THE  FIRST DESK WILL DIRECT YOU WHERE TO GO  Date of Procedure:_____________________________________  Arrival Time for Procedure:______________________________  Instructions regarding medication:   __X__ : Hold diabetes medication morning of procedure   __X__:  Hold other medications as follows: Do not take your Lasix the morning of your stress test. Ok to take later that day.   PLEASE NOTIFY THE OFFICE AT LEAST 57 HOURS IN ADVANCE IF YOU ARE UNABLE TO KEEP YOUR APPOINTMENT.  [phone] AND  PLEASE NOTIFY NUCLEAR MEDICINE AT [facility] AT LEAST 24 HOURS IN ADVANCE IF YOU ARE UNABLE TO KEEP YOUR APPOINTMENT. [phone]  How to prepare for your Myoview test:  Do not eat or drink after midnight No caffeine for 24 hours prior to test No smoking 24 hours prior to test. Your medication may be taken with water.  If your doctor stopped a medication because of this test, do not take that medication. Please wear a short sleeve shirt. No cologne or lotion. Wear comfortable walking shoes. No heels!

## 2022-02-15 ENCOUNTER — Encounter
Admission: RE | Admit: 2022-02-15 | Discharge: 2022-02-15 | Disposition: A | Discharge status: Home / Self Care | Payer: Medicare PPO | Source: Ambulatory Visit | Attending: Cardiovascular Disease | Admitting: Cardiovascular Disease

## 2022-02-15 DIAGNOSIS — I209 Angina pectoris, unspecified: Secondary | ICD-10-CM | POA: Diagnosis not present

## 2022-02-15 DIAGNOSIS — R079 Chest pain, unspecified: Secondary | ICD-10-CM | POA: Diagnosis not present

## 2022-02-15 MED ORDER — TECHNETIUM TC 99M TETROFOSMIN IV KIT
31.0700 | PACK | Freq: Once | INTRAVENOUS | Status: AC | PRN
  Administered 2022-02-15: 31.07 via INTRAVENOUS

## 2022-02-15 MED ORDER — REGADENOSON 0.4 MG/5ML IV SOLN
0.4000 mg | Freq: Once | INTRAVENOUS | Status: AC
  Administered 2022-02-15: 0.4 mg via INTRAVENOUS

## 2022-02-15 MED ORDER — TECHNETIUM TC 99M TETROFOSMIN IV KIT
10.0000 | PACK | Freq: Once | INTRAVENOUS | Status: AC
  Administered 2022-02-15: 10.64 via INTRAVENOUS

## 2022-02-16 LAB — NM MYOCAR MULTI W/SPECT W/WALL MOTION / EF
Base ST Depression (mm): 0 mm
LV dias vol: 103 mL (ref 62–150)
LV sys vol: 52 mL
Nuc Stress EF: 50 %
Peak HR: 107 {beats}/min
Percent HR: 70 %
Rest HR: 71 {beats}/min
Rest Nuclear Isotope Dose: 10.6 mCi
SDS: 0
SRS: 16
SSS: 7
ST Depression (mm): 0 mm
Stress Nuclear Isotope Dose: 31.1 mCi
TID: 1.11

## 2022-02-21 ENCOUNTER — Telehealth: Payer: Self-pay

## 2022-02-21 IMAGING — DX DG CHEST 1V PORT
1 series · 1 of 1 positions shown · non-contrast
Comparison: 03/24/2020

CLINICAL DATA: 64-year-old male with chest pain

EXAM:
PORTABLE CHEST 1 VIEW

[chest ap]
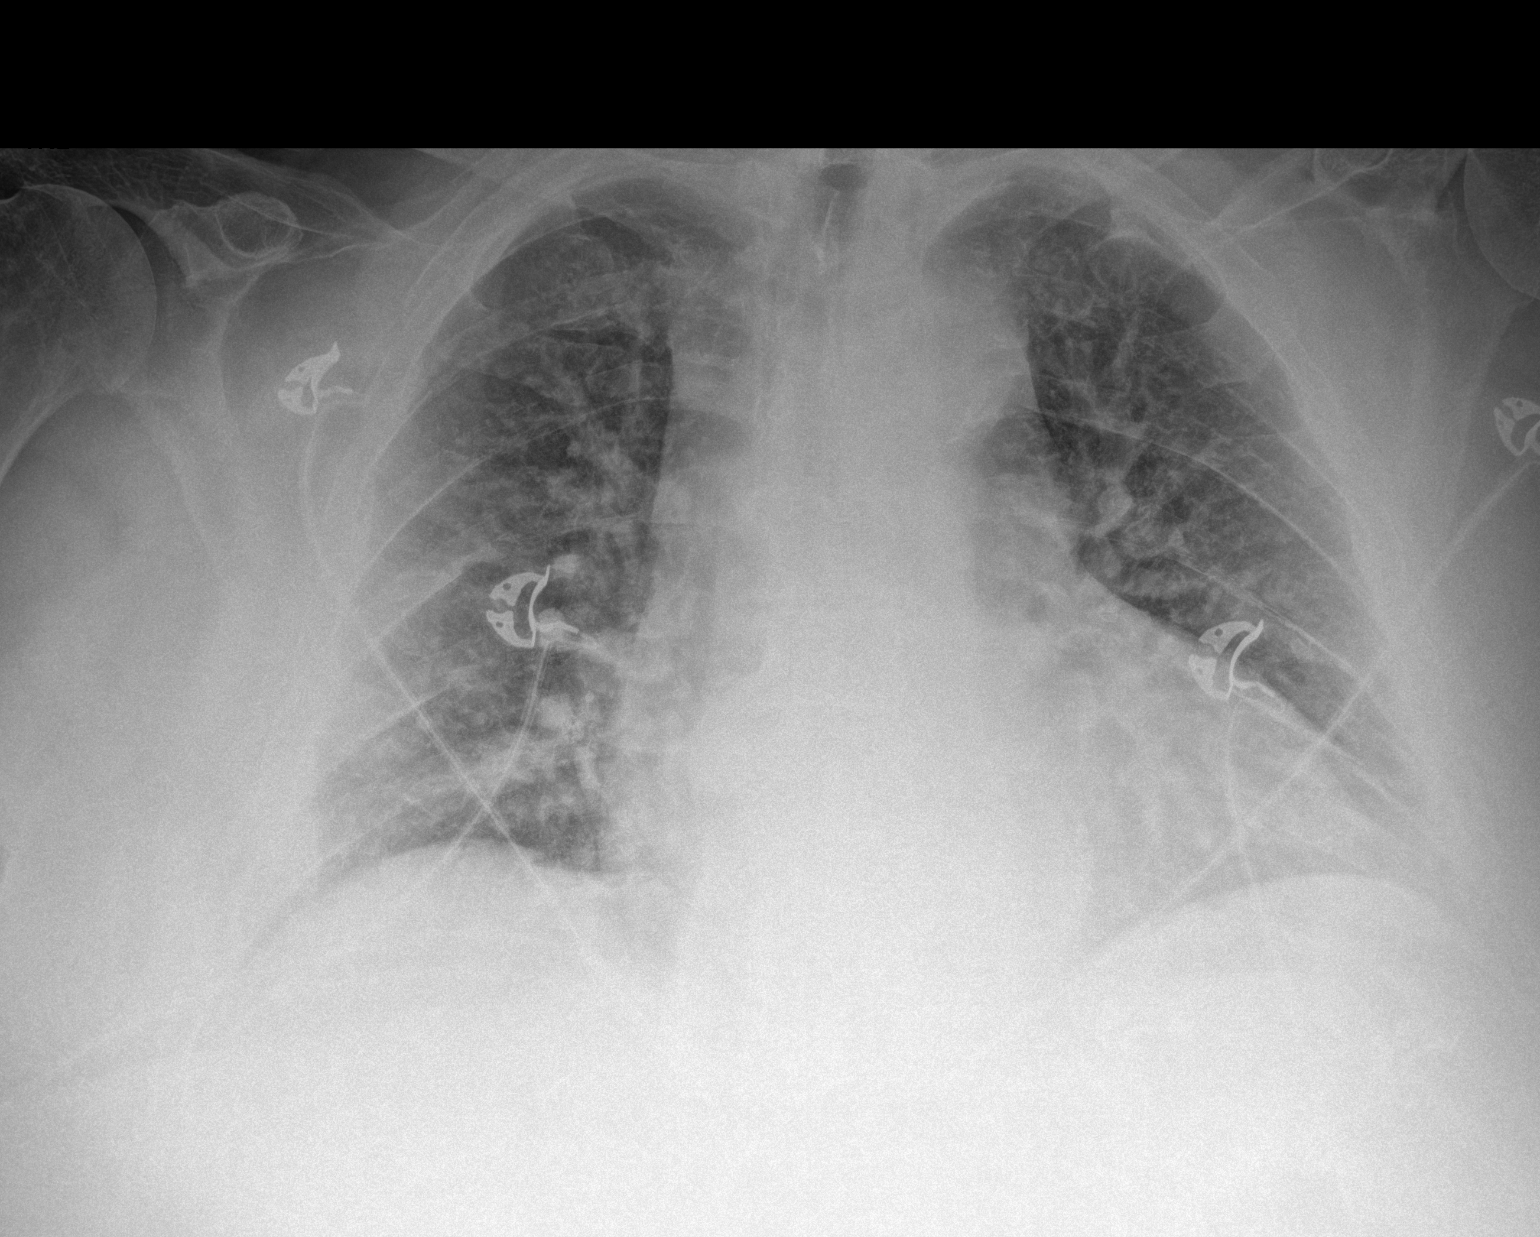

[1 of 1 positions shown; findings below may reference images not displayed]

FINDINGS: Cardiomediastinal silhouette unchanged.

Low lung volumes. Reticulonodular opacities throughout, new from the
prior. No pneumothorax. No pleural effusion.

No displaced fracture.
IMPRESSION: Low lung volumes with reticulonodular opacities concerning for
multifocal infection

## 2022-02-21 NOTE — Telephone Encounter (Signed)
Pt returning call. Transferred to [place], [place], [place].

## 2022-02-21 NOTE — Telephone Encounter (Signed)
Janan Ridge, [place]  02/21/2022  9:12 AM EDT Back to Top    Called patient to review results No answer. LMOV to call back.    [name], MD  02/19/2022  1:11 PM EDT     Stress test no significant ischemia noted Some GI uptake artifact noted No further cardiac testing needed

## 2022-02-22 ENCOUNTER — Emergency Department
Admission: EM | Admit: 2022-02-22 | Discharge: 2022-02-22 | Disposition: A | Discharge status: Home / Self Care | Payer: Medicare PPO | Attending: Emergency Medicine | Admitting: Emergency Medicine

## 2022-02-22 ENCOUNTER — Telehealth: Payer: Self-pay

## 2022-02-22 ENCOUNTER — Emergency Department: Payer: Medicare PPO

## 2022-02-22 DIAGNOSIS — R202 Paresthesia of skin: Secondary | ICD-10-CM | POA: Diagnosis not present

## 2022-02-22 DIAGNOSIS — H538 Other visual disturbances: Secondary | ICD-10-CM | POA: Insufficient documentation

## 2022-02-22 DIAGNOSIS — R42 Dizziness and giddiness: Secondary | ICD-10-CM | POA: Diagnosis not present

## 2022-02-22 DIAGNOSIS — M542 Cervicalgia: Secondary | ICD-10-CM | POA: Diagnosis not present

## 2022-02-22 DIAGNOSIS — J449 Chronic obstructive pulmonary disease, unspecified: Secondary | ICD-10-CM | POA: Insufficient documentation

## 2022-02-22 DIAGNOSIS — N189 Chronic kidney disease, unspecified: Secondary | ICD-10-CM | POA: Diagnosis not present

## 2022-02-22 DIAGNOSIS — I509 Heart failure, unspecified: Secondary | ICD-10-CM | POA: Insufficient documentation

## 2022-02-22 DIAGNOSIS — R197 Diarrhea, unspecified: Secondary | ICD-10-CM | POA: Insufficient documentation

## 2022-02-22 DIAGNOSIS — K429 Umbilical hernia without obstruction or gangrene: Secondary | ICD-10-CM | POA: Insufficient documentation

## 2022-02-22 DIAGNOSIS — N281 Cyst of kidney, acquired: Secondary | ICD-10-CM | POA: Diagnosis not present

## 2022-02-22 DIAGNOSIS — R109 Unspecified abdominal pain: Secondary | ICD-10-CM | POA: Diagnosis not present

## 2022-02-22 DIAGNOSIS — I2510 Atherosclerotic heart disease of native coronary artery without angina pectoris: Secondary | ICD-10-CM | POA: Diagnosis not present

## 2022-02-22 DIAGNOSIS — R1084 Generalized abdominal pain: Secondary | ICD-10-CM

## 2022-02-22 DIAGNOSIS — Z7901 Long term (current) use of anticoagulants: Secondary | ICD-10-CM | POA: Diagnosis not present

## 2022-02-22 DIAGNOSIS — R9431 Abnormal electrocardiogram [ECG] [EKG]: Secondary | ICD-10-CM | POA: Diagnosis not present

## 2022-02-22 DIAGNOSIS — R200 Anesthesia of skin: Secondary | ICD-10-CM | POA: Insufficient documentation

## 2022-02-22 DIAGNOSIS — I10 Essential (primary) hypertension: Secondary | ICD-10-CM | POA: Diagnosis not present

## 2022-02-22 LAB — COMPREHENSIVE METABOLIC PANEL
ALT: 17 U/L (ref 0–44)
AST: 22 U/L (ref 15–41)
Albumin: 3.2 g/dL — ABNORMAL LOW (ref 3.5–5.0)
Alkaline Phosphatase: 85 U/L (ref 38–126)
Anion gap: 10 (ref 5–15)
BUN: 40 mg/dL — ABNORMAL HIGH (ref 8–23)
CO2: 21 mmol/L — ABNORMAL LOW (ref 22–32)
Calcium: 8.7 mg/dL — ABNORMAL LOW (ref 8.9–10.3)
Chloride: 107 mmol/L (ref 98–111)
Creatinine, Ser: 2.43 mg/dL — ABNORMAL HIGH (ref 0.61–1.24)
GFR, Estimated: 28 mL/min — ABNORMAL LOW (ref >60)
Glucose, Bld: 223 mg/dL — ABNORMAL HIGH (ref 70–99)
Potassium: 3.8 mmol/L (ref 3.5–5.1)
Sodium: 138 mmol/L (ref 135–145)
Total Bilirubin: 1.1 mg/dL (ref 0.3–1.2)
Total Protein: 7.3 g/dL (ref 6.5–8.1)

## 2022-02-22 LAB — URINALYSIS, ROUTINE W REFLEX MICROSCOPIC
Bilirubin Urine: NEGATIVE
Glucose, UA: 50 mg/dL — AB
Hgb urine dipstick: NEGATIVE
Ketones, ur: NEGATIVE mg/dL
Leukocytes,Ua: NEGATIVE
Nitrite: NEGATIVE
Protein, ur: >=300 mg/dL — AB
Specific Gravity, Urine: 1.012 (ref 1.005–1.030)
Squamous Epithelial / HPF: NONE SEEN (ref 0–5)
pH: 5 (ref 5.0–8.0)

## 2022-02-22 LAB — CBC
HCT: 37.1 % — ABNORMAL LOW (ref 39.0–52.0)
Hemoglobin: 11.9 g/dL — ABNORMAL LOW (ref 13.0–17.0)
MCH: 27.9 pg (ref 26.0–34.0)
MCHC: 32.1 g/dL (ref 30.0–36.0)
MCV: 87.1 fL (ref 80.0–100.0)
Platelets: 196 10*3/uL (ref 150–400)
RBC: 4.26 MIL/uL (ref 4.22–5.81)
RDW: 14.5 % (ref 11.5–15.5)
WBC: 5.7 10*3/uL (ref 4.0–10.5)
nRBC: 0 % (ref 0.0–0.2)

## 2022-02-22 LAB — LIPASE, BLOOD: Lipase: 41 U/L (ref 11–51)

## 2022-02-22 MED ORDER — ONDANSETRON 4 MG PO TBDP
4.0000 mg | ORAL_TABLET | Freq: Once | ORAL | Status: AC
  Administered 2022-02-22: 4 mg via ORAL
  Filled 2022-02-22: qty 1

## 2022-02-22 NOTE — Telephone Encounter (Signed)
Pt called c/o having numbness in his hands and feet, severe Headache, dizzy, nausea, vomiting and diarrhea,  states his HR is 60 and 02 is 97 %.  I advised pt that if he isn't able to come into clinic to be evaluated due to transportation issues that he needs to call 911 and be evaluated at the hospital.  Pt was concerned of not having a way back home and I informed pt that he needs to go to the Hospital and be evaluated and they will figure out something for him to get back home, that he needs to worry about his health concerns.  Pt agreed to call 911 and go to ER

## 2022-02-22 NOTE — Telephone Encounter (Signed)
The patient has been notified of the result and verbalized understanding.  All questions (if any) were answered. Janan Ridge, [place] 02/22/2022 3:01 PM

## 2022-02-22 NOTE — ED Triage Notes (Signed)
Pt also reports n/v/d and abdominal pain today.

## 2022-02-22 NOTE — Discharge Instructions (Signed)
Please follow-up with your primary care provider.  You have an upcoming appointment for MRI of your abdomen.  Please keep that appointment.  If your symptoms change or worsen or you develop new symptoms of concern and are unable to see primary care, please return to the emergency department.

## 2022-02-22 NOTE — ED Provider Notes (Signed)
[facility] Provider Note    Event Date/Time   First MD Initiated Contact with Patient 02/22/22 1638     (approximate)   History   Neck Pain and Numbness   HPI  Larry Callahan is a 66 y.o. male with history CAD, a flutter on Eliquis, COPD, hyperparathyroidism, CHF, CKD and as listed in EMR presents to the emergency department for treatment and evaluation of numbness in the upper and lower extremities.  He is also complaining of nausea, vomiting, diarrhea, abdominal pain, headache, blurred vision, and neck pain. Symptoms started around 11:00 this afternoon. No known fever. No known COVID exposure. Denies chest pain or shortness of breath.    Physical Exam   Triage Vital Signs: ED Triage Vitals  Enc Vitals Group     BP 02/22/22 1626 (!) 162/97     Pulse Rate 02/22/22 1626 98     Resp 02/22/22 1626 18     Temp 02/22/22 1626 98.2 F (36.8 C)     Temp Source 02/22/22 1626 Oral     SpO2 02/22/22 1626 94 %     Weight 02/22/22 1627 240 lb (108.9 kg)     Height 02/22/22 1627 '5\' 11"'$  (1.803 m)     Head Circumference --      Peak Flow --      Pain Score 02/22/22 1626 8     Pain Loc --      Pain Edu? --      Excl. in [place]? --     Most recent vital signs: Vitals:   02/22/22 1626  BP: (!) 162/97  Pulse: 98  Resp: 18  Temp: 98.2 F (36.8 C)  SpO2: 94%    General: Awake, no distress.  CV:  Good peripheral perfusion. No pitting edema of lower extremities. Resp:  Normal effort. Breath sounds clear Abd:  No distention. Umbilical hernia present--soft, normal skin color, no tenderness to palpation. Other:  FROM of extremities.  No weakness in upper or lower extremities.  Patient able to determine sharp and dull sensation of hands and feet.  Able to stand and ambulate without assistance and with a steady gait.   ED Results / Procedures / Treatments   Labs (all labs ordered are listed, but only abnormal results are displayed) Labs Reviewed   LIPASE, BLOOD  COMPREHENSIVE METABOLIC PANEL  CBC  URINALYSIS, ROUTINE W REFLEX MICROSCOPIC     EKG  Atrial flutter with rate of 107   RADIOLOGY  Image and radiology report reviewed by me.  CT abdomen and pelvis without acute findings.  Incidental findings of umbilical hernia and renal lesion again identified.  PROCEDURES:  Critical Care performed: No  Procedures   MEDICATIONS ORDERED IN ED: Medications - No data to display   IMPRESSION / MDM / [place] / ED COURSE   I have reviewed the triage note.  Differential diagnosis includes, but is not limited to, viral syndrome, electrolyte imbalance, intra-abdominal pathology, paresthesias.  The patient is on the cardiac monitor to evaluate for evidence of arrhythmia and/or significant heart rate changes.  Previous EKG and results from [facility] reviewed.  No acute changes on his EKG and no findings of concern on myoview per cardiology.  Exam is reassuring.  He has no focal weakness.  Paresthesias are reported as bilateral hands and bilateral feet.  He is able to determine sharp and dull sensation.  He is also able to ambulate without assistance and with a steady gait.  Because  of his multiple comorbidities and his complaint of nausea, vomiting, and diarrhea today plan will be to get a CT of the abdomen and pelvis.  Due to his CKD, no contrast will be administered.  He will also be given a dose of Zofran.  No further nausea or vomiting after Zofran and no reported diarrhea.  CT of the abdomen and pelvis is without acute concerns.  Lab studies are also reassuring.  Results discussed with the patient as well as plan for discharge.  He is aware that he has an upcoming MRI to further evaluate the renal lesion.  He was encouraged to keep that appointment as scheduled.  If his symptoms that caused him to present to the emergency department today change or worsen he is to try and see primary care.  If unable to see primary  care and he has concerns he is to return to the emergency department.     FINAL CLINICAL IMPRESSION(S) / ED DIAGNOSES   Final diagnoses:  None     Rx / DC Orders   ED Discharge Orders     None        Note:  This document was prepared using Dragon voice recognition software and may include unintentional dictation errors.   Victorino Dike, FNP 02/22/22 Docia Chuck    Lucillie Garfinkel, MD 02/22/22 [phone]

## 2022-02-22 NOTE — ED Triage Notes (Signed)
Pt BIB [facility] from home C/O numbness in BLE and BUE. Pt also reports headache and neck pain.

## 2022-02-23 ENCOUNTER — Telehealth: Payer: Self-pay

## 2022-02-23 NOTE — Telephone Encounter (Signed)
        Patient  visited [facility] ED on 02/22/2022  f   Telephone encounter attempt :  1ST  A HIPAA compliant voice message was left requesting a return call.  Instructed patient to call back at earliest convenience .    [place], Care Management  [phone], [place], [place] [address] Phone: [phone] Email: [email or web address]

## 2022-02-24 ENCOUNTER — Telehealth: Payer: Self-pay | Admitting: Internal Medicine

## 2022-02-24 NOTE — Telephone Encounter (Signed)
Lvm to schedule video ED follow up-[name]

## 2022-02-25 ENCOUNTER — Telehealth: Payer: Self-pay

## 2022-02-25 ENCOUNTER — Other Ambulatory Visit: Payer: Self-pay | Admitting: Family

## 2022-02-25 MED ORDER — FUROSEMIDE 40 MG PO TABS: 40.0000 mg | ORAL_TABLET | Freq: Every day | ORAL | 3 refills | Status: DC

## 2022-02-25 MED ORDER — CARVEDILOL 12.5 MG PO TABS: 12.5000 mg | ORAL_TABLET | Freq: Two times a day (BID) | ORAL | 3 refills | Status: DC

## 2022-02-25 NOTE — Telephone Encounter (Signed)
Pt returning call for appt he said he will call back he find ride or he can do video also advised we discuss in details for his glucose at visit just bring all mead at visit

## 2022-02-25 NOTE — Telephone Encounter (Signed)
        Patient  visited [facility] ED on 9/12    Telephone encounter attempt :  2nd  A HIPAA compliant voice message was left requesting a return call.  Instructed patient to call back at  .    [place], Care Management  [phone] [address], [place], [place] [address] Phone: [phone] Email: [email or web address]

## 2022-02-28 ENCOUNTER — Telehealth: Payer: Self-pay

## 2022-02-28 ENCOUNTER — Telehealth: Payer: Self-pay | Admitting: Nurse Practitioner

## 2022-02-28 ENCOUNTER — Telehealth: Payer: Self-pay | Admitting: *Deleted

## 2022-02-28 NOTE — Telephone Encounter (Signed)
Pt called and was stating he needs an appt for Thursday as he has a ride for that day and can only come then.  I informed Vivien Rota and she will call pt back and get him scheduled possibly.  Pt was also talking about his blood sugars are still kind f elevated some and he doesn't know why bc he says he is eating what he is suppose to eat.  I did get the times that pt checks his sugars and the times he eats.  I feel he needs to have some type of chart that tells when he checks his sugars and also list what he is eating through out the day.  Pt was informed to bring all his medications to his f/u appt.

## 2022-02-28 NOTE — Telephone Encounter (Signed)
Attempted to return call to patient @ his sister's home. Call will not go through. Lvm @ patient's home to return my call to schedule ED follow up-[name]

## 2022-02-28 NOTE — Telephone Encounter (Signed)
        Patient  visited [facility] ed on 02/22/2022  for Skin   Telephone encounter attempt :  1st  A HIPAA compliant voice message was left requesting a return call.  Instructed patient to call back at [phone].  [place] [phone] [address] , [place] [address] Email : [email or web address]

## 2022-03-01 ENCOUNTER — Other Ambulatory Visit: Payer: Self-pay | Admitting: Nurse Practitioner

## 2022-03-01 DIAGNOSIS — E1122 Type 2 diabetes mellitus with diabetic chronic kidney disease: Secondary | ICD-10-CM

## 2022-03-02 IMAGING — CR DG CHEST 2V
1 series · 2 of 2 positions shown · non-contrast
Comparison: Chest x-ray 07/11/2020

CLINICAL DATA: Chest pain and shortness of breath.

EXAM:
CHEST - 2 VIEW

[Series 1: dg chest 2 view · 0.14mm/px · 2 of 2 slices shown]
[im 1/2]
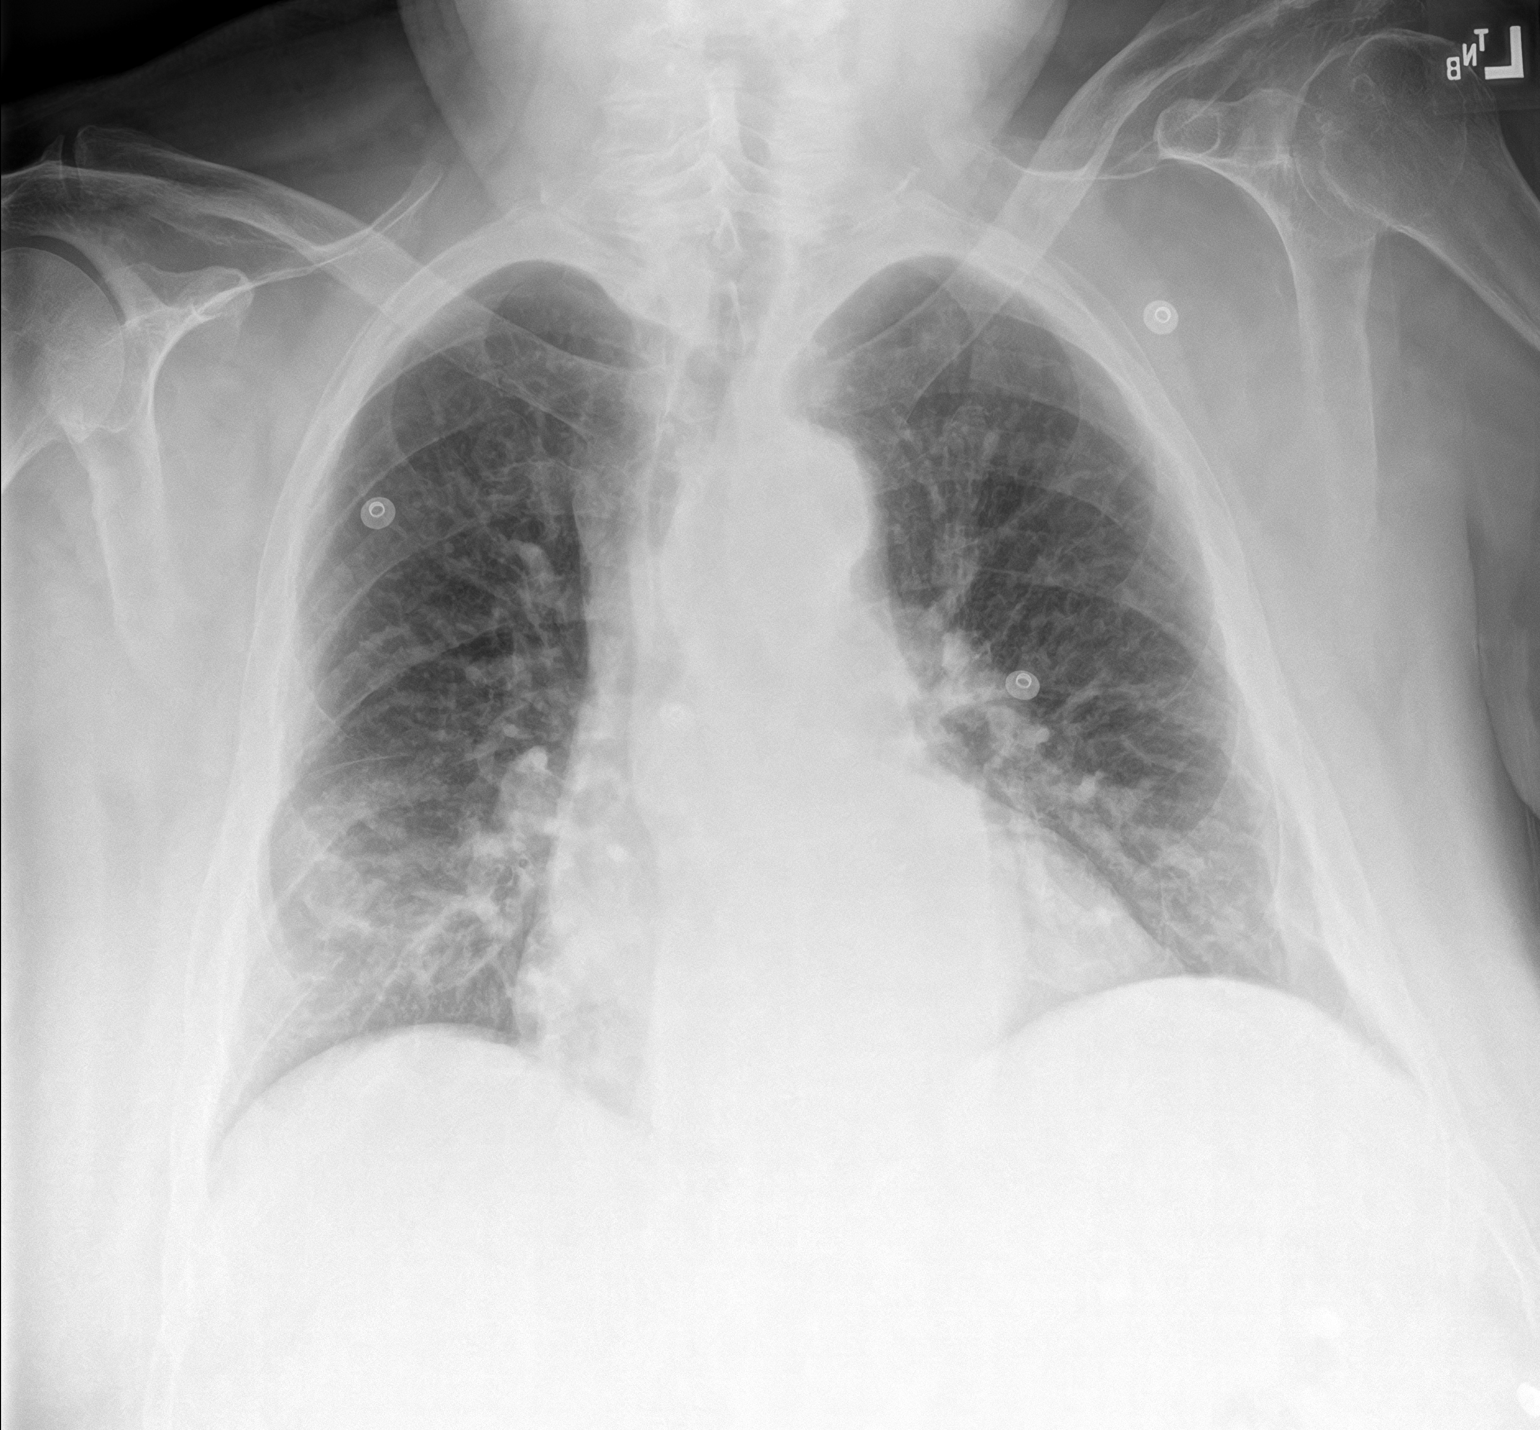
[im 2/2]
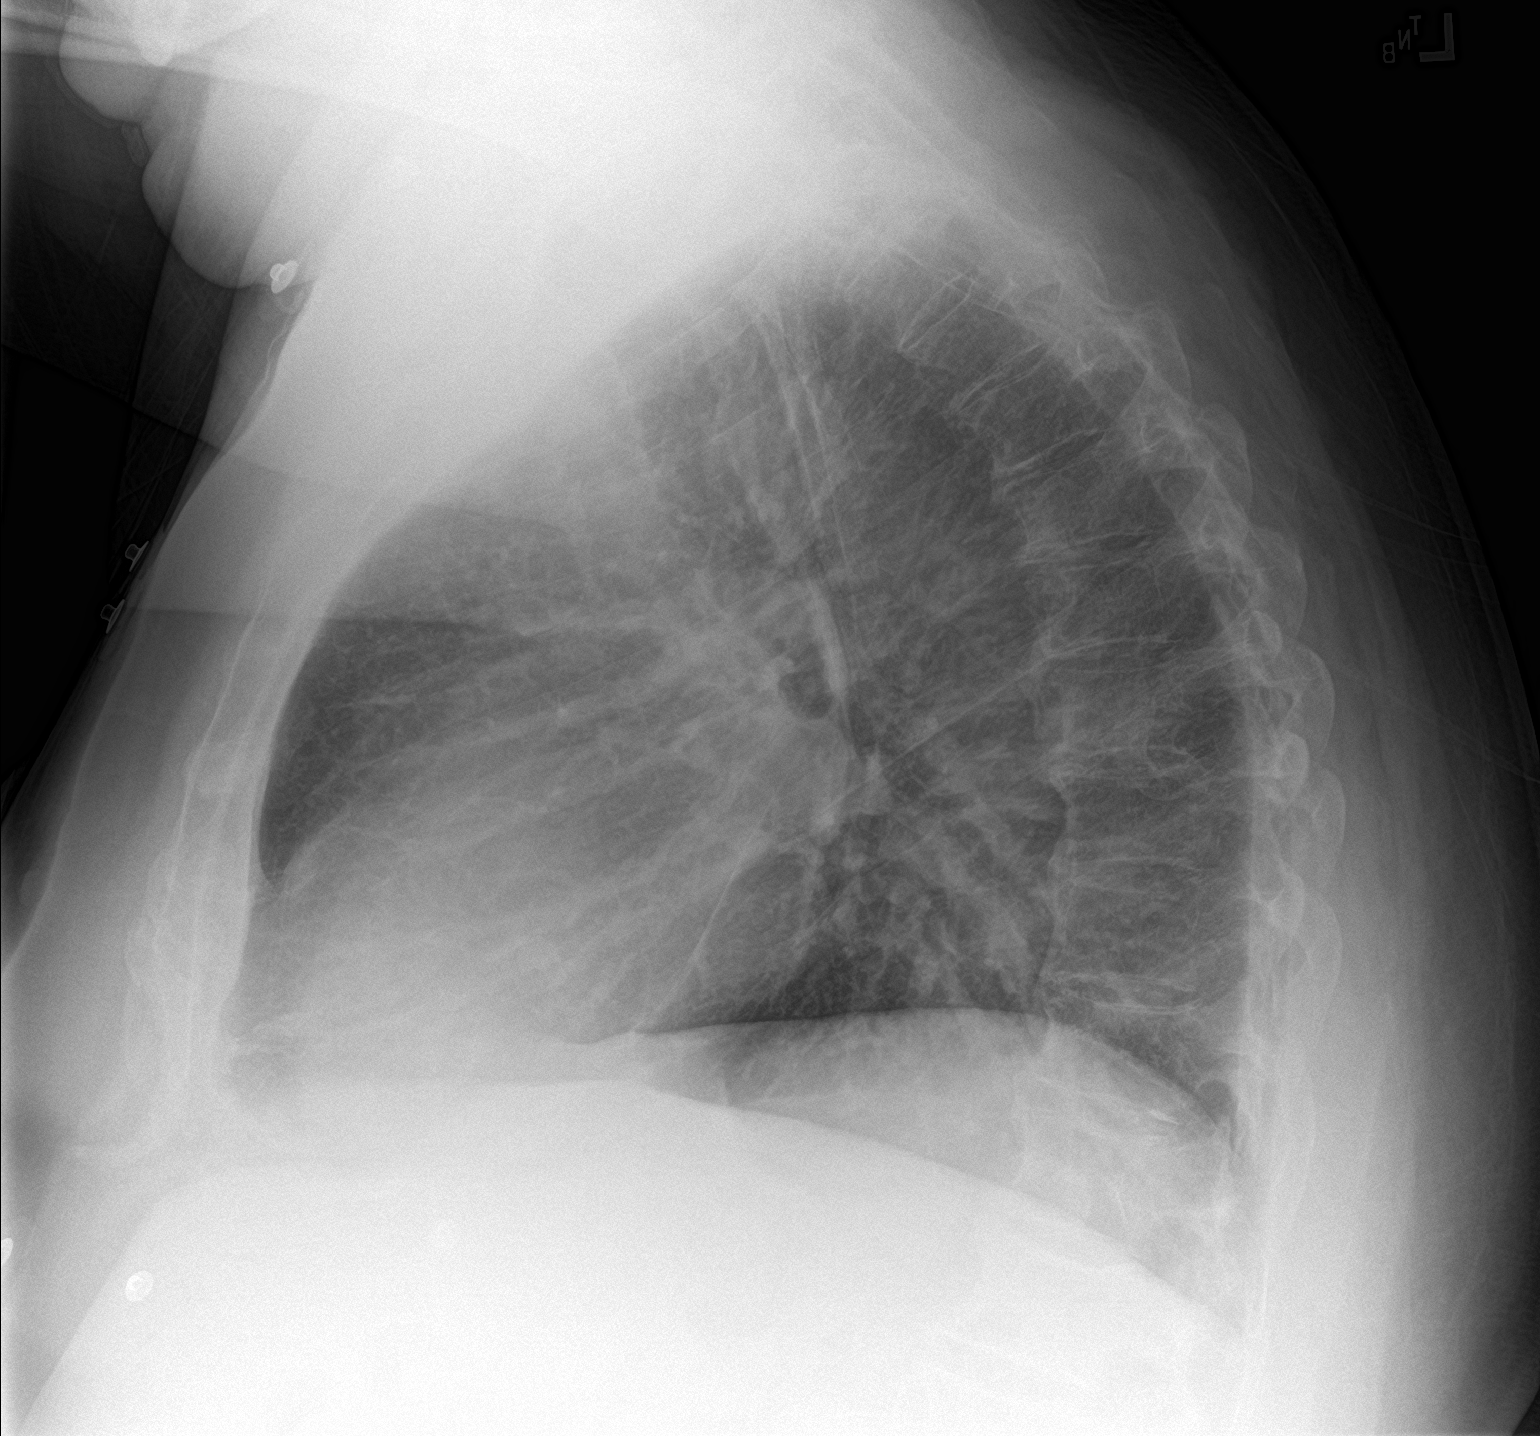

[2 of 2 positions shown; findings below may reference images not displayed]

FINDINGS: The cardiac silhouette, mediastinal and hilar contours are within
normal limits and stable. Vascular crowding and streaky basilar
atelectasis but no definite infiltrates or effusions. The bony
thorax is intact.
IMPRESSION: Vascular crowding and streaky basilar atelectasis.

## 2022-03-03 ENCOUNTER — Encounter: Payer: Self-pay | Admitting: Physician Assistant

## 2022-03-03 ENCOUNTER — Ambulatory Visit (INDEPENDENT_AMBULATORY_CARE_PROVIDER_SITE_OTHER): Payer: Medicare PPO | Admitting: Physician Assistant

## 2022-03-03 DIAGNOSIS — I10 Essential (primary) hypertension: Secondary | ICD-10-CM

## 2022-03-03 DIAGNOSIS — Z23 Encounter for immunization: Secondary | ICD-10-CM | POA: Diagnosis not present

## 2022-03-03 DIAGNOSIS — E1122 Type 2 diabetes mellitus with diabetic chronic kidney disease: Secondary | ICD-10-CM | POA: Diagnosis not present

## 2022-03-03 DIAGNOSIS — R4582 Worries: Secondary | ICD-10-CM | POA: Diagnosis not present

## 2022-03-03 DIAGNOSIS — J449 Chronic obstructive pulmonary disease, unspecified: Secondary | ICD-10-CM

## 2022-03-03 DIAGNOSIS — N184 Chronic kidney disease, stage 4 (severe): Secondary | ICD-10-CM | POA: Diagnosis not present

## 2022-03-03 LAB — POCT GLYCOSYLATED HEMOGLOBIN (HGB A1C): Hemoglobin A1C: 6.3 % — AB (ref 4.0–5.6)

## 2022-03-03 MED ORDER — TOUJEO MAX SOLOSTAR 300 UNIT/ML ~~LOC~~ SOPN: PEN_INJECTOR | SUBCUTANEOUS | 3 refills | Status: DC

## 2022-03-03 NOTE — Progress Notes (Signed)
[facility] [place], [place] [id number]  Internal MEDICINE  Office Visit Note  Patient Name: Larry Callahan  [id number]  [id number]  Date of Service: 03/08/2022  Chief Complaint  Patient presents with   Follow-up    Follow up ED   Diabetes   Gastroesophageal Reflux   Hypertension   Hyperlipidemia    HPI Pt is here for ED follow up -He went to ED on 02/22/22 for numbness in extremities. He had an EKG, as well as CT abd/pelvis due to additional GI complaints of N,V,D. He also had lab work done and all testing was reassuring without any acute concerns, therefore he was discharged. -Throughout visit today he continues to worry about his glucose readings and how they fluctuate. -We discussed his numbers have been good and it is normal to have some fluctuations based on what he is eating. -His A1c continues to be very well controlled on his current medications. -he does state he needs a Toujeo sample as he will run out before he is able to afford a refill. He also needs a breztri sample. He was given a sample of both -He is also taking amaryl again and wanted to confirm this was correct which it is and will continue to take it once daily -His BP is up a little, likely due to his continued worrying about his diabetic numbers  Current Medication: Outpatient Encounter Medications as of 03/03/2022  Medication Sig   Accu-Chek Softclix Lancets lancets Use to check blood sugars 4 times a day   E11.65   apixaban (ELIQUIS) 5 MG TABS tablet Take 1 tablet (5 mg total) by mouth 2 (two) times daily. Must make appt with Dr. Rockey Situ for refills   Blood Glucose Monitoring Suppl (TRUE METRIX AIR GLUCOSE METER) DEVI 1 Device by Does not apply route 2 (two) times daily.   carvedilol (COREG) 12.5 MG tablet Take 1 tablet (12.5 mg total) by mouth 2 (two) times daily.   cetirizine (ZYRTEC) 10 MG tablet Take 10 mg by mouth daily as needed for allergies. Buys OTC   cyanocobalamin  1000 MCG tablet Take 1 tablet (1,000 mcg total) by mouth daily.   furosemide (LASIX) 40 MG tablet Take 1 tablet (40 mg total) by mouth daily. Take lasix 59m po daily   glimepiride (AMARYL) 2 MG tablet Take 1 tablet (2 mg total) by mouth daily.   glucose blood (ACCU-CHEK GUIDE) test strip Use to check blood sugars 4 times a day   E11.65   hydrALAZINE (APRESOLINE) 100 MG tablet Take 100 mg by mouth 3 (three) times daily.   ipratropium-albuterol (DUONEB) 0.5-2.5 (3) MG/3ML SOLN Take 3 mLs by nebulization every 6 (six) hours as needed.   NOVOFINE PEN NEEDLE 32G X 6 MM MISC Use as directed   omeprazole (PRILOSEC) 40 MG capsule Take 1 capsule once in the morning and once in the evening   potassium chloride (KLOR-CON) 20 MEQ packet Take 20 mEq by mouth daily.   rosuvastatin (CRESTOR) 10 MG tablet Take 1 tablet (10 mg total) by mouth at bedtime.   [DISCONTINUED] Budeson-Glycopyrrol-Formoterol (BREZTRI AEROSPHERE) 160-9-4.8 MCG/ACT AERO Inhale into the lungs. Samples given.... 2 puffs in the morning and 2 puffs at bedtime   [DISCONTINUED] insulin glargine, 2 Unit Dial, (TOUJEO MAX SOLOSTAR) 300 UNIT/ML Solostar Pen Inject 20 units in am (Patient taking differently: Inject 30 units in am)   Budeson-Glycopyrrol-Formoterol (BREZTRI AEROSPHERE) 160-9-4.8 MCG/ACT AERO Inhale 2 puffs into the lungs 2 (two) times daily.  insulin glargine, 2 Unit Dial, (TOUJEO MAX SOLOSTAR) 300 UNIT/ML Solostar Pen Inject 20 units in am   No facility-administered encounter medications on file as of 03/03/2022.    Surgical History: Past Surgical History:  Procedure Laterality Date   BACK SURGERY     CARDIAC CATHETERIZATION     CHOLECYSTECTOMY     COLONOSCOPY WITH PROPOFOL N/A 04/12/2019   Procedure: COLONOSCOPY WITH PROPOFOL;  Surgeon: [name], MD;  Location: [facility] ENDOSCOPY;  Service: Gastroenterology;  Laterality: N/A;   ESOPHAGOGASTRODUODENOSCOPY N/A 04/12/2019   Procedure: ESOPHAGOGASTRODUODENOSCOPY (EGD);   Surgeon: [name], MD;  Location: [facility] ENDOSCOPY;  Service: Gastroenterology;  Laterality: N/A;   ESOPHAGOGASTRODUODENOSCOPY (EGD) WITH PROPOFOL N/A 11/21/2019   Procedure: ESOPHAGOGASTRODUODENOSCOPY (EGD) WITH PROPOFOL;  Surgeon: [name], MD;  Location: [facility] ENDOSCOPY;  Service: Gastroenterology;  Laterality: N/A;   ESOPHAGOGASTRODUODENOSCOPY (EGD) WITH PROPOFOL N/A 01/11/2022   Procedure: ESOPHAGOGASTRODUODENOSCOPY (EGD) WITH PROPOFOL;  Surgeon: [name], MD;  Location: [name] ENDOSCOPY;  Service: Endoscopy;  Laterality: N/A;   FLEXIBLE BRONCHOSCOPY Bilateral 05/17/2019   Procedure: FLEXIBLE BRONCHOSCOPY;  Surgeon: [name], MD;  Location: [name] ORS;  Service: Pulmonary;  Laterality: Bilateral;   left arm surgery     nephrectomy Left    PARATHYROIDECTOMY     RIGHT HEART CATH N/A 03/29/2019   Procedure: RIGHT HEART CATH;  Surgeon: [name], MD;  Location: [place] [facility] CV LAB;  Service: Cardiovascular;  Laterality: N/A;    Medical History: Past Medical History:  Diagnosis Date   Allergy    Atrial flutter ([place])    a. Dx 12/2020--CHA2DS2VASc = 5-->eliquis.   Chronic combined systolic (congestive) and diastolic (congestive) heart failure ([place])    a. 04/2019 Echo: EF 45-50%; b. 02/2019 Echo: EF 55-60%; c. 09/2020 Echo: EF 35-40%, glob HK. Nl RV size/fxn. Mild MR; d. 07/2021 Echo: EF 40-45%, mild LVH, low-nl RV fxn, no MR.   CKD (chronic kidney disease), stage III (HCC)    COPD (chronic obstructive pulmonary disease) ([place])    Coronary artery disease    a. 2011 Cath: nonobs dzs; b. 09/2020 MV: EF 38%. No signif ischemia. ? inf MI vs diaph attenuation. GI uptake artifact.   Diabetes mellitus without complication (HCC)    GERD (gastroesophageal reflux disease)    History of CVA (cerebrovascular accident) 09/25/2020   Hyperlipidemia    Hypertension    Morbid (severe) obesity due to excess calories ([place]) 06/09/2017   Morbid obesity ([place])    NICM (nonischemic  cardiomyopathy) ([place])    a. 04/2019 Echo: EF 45-50%; b. 02/2019 Echo: EF 55-60%; c. 09/2020 Echo: EF 35-40%, glob HK; d. 09/2020 MV: No ischemia. ? inf infarct vs attenuation; d. 07/2021 Echo: EF 40-45%.   OSA (obstructive sleep apnea)    Pneumonia due to COVID-19 virus 01/30/2020   Stage 3b chronic kidney disease ([place])    Syncope and collapse 11/16/2020    Family History: Family History  Problem Relation Age of Onset   Diabetes Mother        2003   Lung cancer Father    Diabetes Sister    Hypertension Sister    Heart disease Sister    Cancer Sister    Bone cancer Brother    Prostate cancer Neg Hx    Bladder Cancer Neg Hx    Kidney cancer Neg Hx     Social History   Socioeconomic History   Marital status: Single    Spouse name: Not on file   Number of  children: Not on file   Years of education: Not on file   Highest education level: Not on file  Occupational History   Not on file  Tobacco Use   Smoking status: Former    Types: Cigarettes   Smokeless tobacco: Former    Types: Nurse, children's Use: Never used  Substance and Sexual Activity   Alcohol use: No   Drug use: No   Sexual activity: Not Currently  Other Topics Concern   Not on file  Social History Narrative   Not on file   Social Determinants of Health   Financial Resource Strain: Low Risk  (05/12/2021)   Overall Financial Resource Strain (CARDIA)    Difficulty of Paying Living Expenses: Not very hard  Food Insecurity: Not on file  Transportation Needs: Not on file  Physical Activity: Not on file  Stress: Not on file  Social Connections: Not on file  Intimate Partner Violence: Not on file      Review of Systems  Constitutional:  Negative for chills, fatigue and unexpected weight change.  HENT:  Negative for congestion, postnasal drip, rhinorrhea, sneezing and sore throat.   Eyes:  Negative for redness.  Respiratory:  Negative for cough, chest tightness, shortness of breath and wheezing.    Cardiovascular:  Negative for chest pain and palpitations.  Gastrointestinal:  Negative for abdominal pain, constipation, diarrhea, nausea and vomiting.  Genitourinary:  Negative for dysuria and frequency.  Musculoskeletal:  Negative for arthralgias, back pain, joint swelling and neck pain.  Skin:  Negative for rash.  Neurological: Negative.  Negative for tremors and numbness.  Hematological:  Negative for adenopathy. Does not bruise/bleed easily.  Psychiatric/Behavioral:  Negative for behavioral problems (Depression), sleep disturbance and suicidal ideas. The patient is nervous/anxious.     Vital Signs: BP (!) 148/86   Pulse 70   Temp (!) 97.3 F (36.3 C)   Resp 16   Ht _0  (1.803 m)   Wt 236 lb 9.6 oz (107.3 kg)   SpO2 98%   BMI 33.00 kg/m    Physical Exam Vitals and nursing note reviewed.  Constitutional:      General: He is not in acute distress.    Appearance: Normal appearance. He is well-developed. He is obese. He is not diaphoretic.  HENT:     Head: Normocephalic and atraumatic.     Mouth/Throat:     Pharynx: No oropharyngeal exudate.  Eyes:     Pupils: Pupils are equal, round, and reactive to light.  Neck:     Thyroid: No thyromegaly.     Vascular: No JVD.     Trachea: No tracheal deviation.  Cardiovascular:     Rate and Rhythm: Normal rate and regular rhythm.     Heart sounds: Normal heart sounds. No murmur heard.    No friction rub. No gallop.  Pulmonary:     Effort: Pulmonary effort is normal. No respiratory distress.     Breath sounds: No wheezing or rales.  Chest:     Chest wall: No tenderness.  Abdominal:     General: Bowel sounds are normal.     Palpations: Abdomen is soft.  Musculoskeletal:        General: Normal range of motion.     Cervical back: Normal range of motion and neck supple.  Lymphadenopathy:     Cervical: No cervical adenopathy.  Skin:    General: Skin is warm and dry.  Neurological:     Mental  Status: He is alert and  oriented to person, place, and time.     Cranial Nerves: No cranial nerve deficit.  Psychiatric:        Behavior: Behavior normal.        Thought Content: Thought content normal.        Judgment: Judgment normal.        Assessment/Plan: 1. Benign hypertension Elevated in office, likely due to continued worrying  2. Diabetes mellitus with stage 4 chronic kidney disease (HCC) - POCT glycosylated hemoglobin (Hb A1C) is 6.3. Gave reassurance that his numbers look good and are very well controlled and he may have some fluctuations that are normal. Continue current medications  3. Chronic obstructive pulmonary disease, unspecified COPD type ([place]) - Budeson-Glycopyrrol-Formoterol (BREZTRI AEROSPHERE) 160-9-4.8 MCG/ACT AERO; Inhale 2 puffs into the lungs 2 (two) times daily.  Dispense: 2 each; Refill: 3  4. Feeling worried Provided reassurance  5. Flu vaccine need - Flu Vaccine MDCK QUAD PF   General Counseling: Larry Callahan verbalizes understanding of the findings of todays visit and agrees with plan of treatment. I have discussed any further diagnostic evaluation that may be needed or ordered today. We also reviewed his medications today. he has been encouraged to call the office with any questions or concerns that should arise related to todays visit.    Orders Placed This Encounter  Procedures   Flu Vaccine MDCK QUAD PF   POCT glycosylated hemoglobin (Hb A1C)    Meds ordered this encounter  Medications   insulin glargine, 2 Unit Dial, (TOUJEO MAX SOLOSTAR) 300 UNIT/ML Solostar Pen    Sig: Inject 20 units in am    Dispense:  9 mL    Refill:  3   Budeson-Glycopyrrol-Formoterol (BREZTRI AEROSPHERE) 160-9-4.8 MCG/ACT AERO    Sig: Inhale 2 puffs into the lungs 2 (two) times daily.    Dispense:  2 each    Refill:  3    This patient was seen by Drema Dallas, PA-C in collaboration with Dr. Clayborn Bigness as a part of collaborative care agreement.   Total time spent:35 Minutes Time  spent includes review of chart, medications, test results, and follow up plan with the patient.      Dr Lavera Guise Internal medicine

## 2022-03-08 MED ORDER — BREZTRI AEROSPHERE 160-9-4.8 MCG/ACT IN AERO: 2.0000 | INHALATION_SPRAY | Freq: Two times a day (BID) | RESPIRATORY_TRACT | 3 refills | Status: DC

## 2022-03-22 ENCOUNTER — Telehealth: Payer: Self-pay | Admitting: Nurse Practitioner

## 2022-03-22 NOTE — Telephone Encounter (Signed)
Left vm to confirm 03/29/22 appointment-[name]

## 2022-03-23 ENCOUNTER — Encounter: Payer: Self-pay | Admitting: Family

## 2022-03-23 ENCOUNTER — Ambulatory Visit: Payer: Medicare PPO | Attending: Family | Admitting: Family

## 2022-03-23 ENCOUNTER — Encounter: Payer: Self-pay | Admitting: Pharmacist

## 2022-03-23 ENCOUNTER — Other Ambulatory Visit (HOSPITAL_COMMUNITY): Payer: Self-pay

## 2022-03-23 VITALS — BP 161/87 | HR 76 | Resp 18 | Ht 71.0 in | Wt 234.1 lb

## 2022-03-23 DIAGNOSIS — G4733 Obstructive sleep apnea (adult) (pediatric): Secondary | ICD-10-CM | POA: Insufficient documentation

## 2022-03-23 DIAGNOSIS — Z794 Long term (current) use of insulin: Secondary | ICD-10-CM | POA: Diagnosis not present

## 2022-03-23 DIAGNOSIS — I10 Essential (primary) hypertension: Secondary | ICD-10-CM | POA: Diagnosis not present

## 2022-03-23 DIAGNOSIS — Z7189 Other specified counseling: Secondary | ICD-10-CM

## 2022-03-23 DIAGNOSIS — N184 Chronic kidney disease, stage 4 (severe): Secondary | ICD-10-CM | POA: Diagnosis not present

## 2022-03-23 DIAGNOSIS — E1122 Type 2 diabetes mellitus with diabetic chronic kidney disease: Secondary | ICD-10-CM | POA: Insufficient documentation

## 2022-03-23 DIAGNOSIS — I428 Other cardiomyopathies: Secondary | ICD-10-CM | POA: Insufficient documentation

## 2022-03-23 DIAGNOSIS — I5020 Unspecified systolic (congestive) heart failure: Secondary | ICD-10-CM

## 2022-03-23 DIAGNOSIS — Z79899 Other long term (current) drug therapy: Secondary | ICD-10-CM | POA: Insufficient documentation

## 2022-03-23 DIAGNOSIS — I130 Hypertensive heart and chronic kidney disease with heart failure and stage 1 through stage 4 chronic kidney disease, or unspecified chronic kidney disease: Secondary | ICD-10-CM | POA: Diagnosis not present

## 2022-03-23 DIAGNOSIS — J449 Chronic obstructive pulmonary disease, unspecified: Secondary | ICD-10-CM | POA: Insufficient documentation

## 2022-03-23 NOTE — Progress Notes (Signed)
Patient ID: Larry Callahan, male    DOB: [birth year], 66 y.o.   MRN: [id number]  HPI  Larry Callahan is a 66 yo male with a PMHx of nonischemic cardiomyopathy last EF measured in April 2022 was 46 to 40%, COPD, sleep apnea, chronic kidney disease stage IV, chronic anemia, hypertension, and diabetes mellitus.  Echo from 08/08/21 reviewed and showed and EF of 40-45%. There is mild to moderately decreased function of the left ventricle. The diastolic parameters are indeterminate. The RV systolic function is low normal.   RHC done 03/29/2019 and showed: Pulmonary capillary wedge pressure mean of 8 PA: 32/16 mean 23 RA mean of 10 RV 26/12/13  Cardiac output 8.17 Cardiac index 3.42 Final Conclusions:   Normal right heart pressures  Was in the ED 02/22/22 due to concerns he was having a stroke and this was ruled out and he was released. Was in the ED 01/20/22 due to atypical chest pain where he was evaluated and released. Admitted 01/09/22 due to  nausea vomiting and noticing bright red blood in his vomit.  He also reported some mild shortness of breath, generalized weakness dizziness. Palliative care and GI consults obtained. EGD done.   Discharged after 3 days. Admitted 01/03/22 due to abdominal pain. Was given morphine with subsequent dizziness. CT abdomen with umbilical hernia with no obstruction and concern of enlarging right renal complex cyst which need further investigation. Surgical consult obtained. Discharged the next day. Admitted 12/17/21 due to acute on chronic heart failure. Discharged after 3 days. Was in the ED 12/06/21 due to needing antibiotic refill as he lost previous prescription. Admitted 11/30/21 due to swelling and pain around the right eye. Ophthamology consult obtained. Started on IV antibiotics due to periorbital cellulitis and then transitioned to oral antibiotics. Discharged after 4 days. Admitted 09/23/21 due to weakness and left-sided chest pain. PT/OT evaluations done.  Cardiology consult obtained. Troponins unremarkable. Discharged after 2 days.   He presents for a follow-up visit with a chief complaint of minimal fatigue upon moderate exertion. Describes this as chronic in nature having been present for several years. He has associated shortness of breath, leg weakness, diarrhea, nausea, anxiety and pain in his left leg along with this. He denies any difficulty sleeping, abdominal distention, palpitations, pedal edema, chest pain, wheezing, cough, dizziness or weight gain.   His 2 biggest concerns are that he was exposed to covid during recent church revival and now he has diarrhea/ nausea and left leg pain that he's afraid is a blood clot because someone at the church revival told him it could be a blood clot. Denies missing any doses of apixaban.   Past Medical History:  Diagnosis Date   Allergy    Atrial flutter ([place])    a. Dx 12/2020--CHA2DS2VASc = 5-->eliquis.   Chronic combined systolic (congestive) and diastolic (congestive) heart failure ([place])    a. 04/2019 Echo: EF 45-50%; b. 02/2019 Echo: EF 55-60%; c. 09/2020 Echo: EF 35-40%, glob HK. Nl RV size/fxn. Mild MR; d. 07/2021 Echo: EF 40-45%, mild LVH, low-nl RV fxn, no MR.   CKD (chronic kidney disease), stage III (HCC)    COPD (chronic obstructive pulmonary disease) ([place])    Coronary artery disease    a. 2011 Cath: nonobs dzs; b. 09/2020 MV: EF 38%. No signif ischemia. ? inf MI vs diaph attenuation. GI uptake artifact.   Diabetes mellitus without complication ([place])    GERD (gastroesophageal reflux disease)    History of CVA (cerebrovascular accident) 09/25/2020  Hyperlipidemia    Hypertension    Morbid (severe) obesity due to excess calories ([place]) 06/09/2017   Morbid obesity ([place])    NICM (nonischemic cardiomyopathy) ([place])    a. 04/2019 Echo: EF 45-50%; b. 02/2019 Echo: EF 55-60%; c. 09/2020 Echo: EF 35-40%, glob HK; d. 09/2020 MV: No ischemia. ? inf infarct vs attenuation; d. 07/2021 Echo: EF 40-45%.    OSA (obstructive sleep apnea)    Pneumonia due to COVID-19 virus 01/30/2020   Stage 3b chronic kidney disease ([place])    Syncope and collapse 11/16/2020   Past Surgical History:  Procedure Laterality Date   BACK SURGERY     CARDIAC CATHETERIZATION     CHOLECYSTECTOMY     COLONOSCOPY WITH PROPOFOL N/A 04/12/2019   Procedure: COLONOSCOPY WITH PROPOFOL;  Surgeon: [name], MD;  Location: [facility] ENDOSCOPY;  Service: Gastroenterology;  Laterality: N/A;   ESOPHAGOGASTRODUODENOSCOPY N/A 04/12/2019   Procedure: ESOPHAGOGASTRODUODENOSCOPY (EGD);  Surgeon: [name], MD;  Location: [facility] ENDOSCOPY;  Service: Gastroenterology;  Laterality: N/A;   ESOPHAGOGASTRODUODENOSCOPY (EGD) WITH PROPOFOL N/A 11/21/2019   Procedure: ESOPHAGOGASTRODUODENOSCOPY (EGD) WITH PROPOFOL;  Surgeon: [name], MD;  Location: [facility] ENDOSCOPY;  Service: Gastroenterology;  Laterality: N/A;   ESOPHAGOGASTRODUODENOSCOPY (EGD) WITH PROPOFOL N/A 01/11/2022   Procedure: ESOPHAGOGASTRODUODENOSCOPY (EGD) WITH PROPOFOL;  Surgeon: [name], MD;  Location: [name] ENDOSCOPY;  Service: Endoscopy;  Laterality: N/A;   FLEXIBLE BRONCHOSCOPY Bilateral 05/17/2019   Procedure: FLEXIBLE BRONCHOSCOPY;  Surgeon: [name], MD;  Location: [name] ORS;  Service: Pulmonary;  Laterality: Bilateral;   left arm surgery     nephrectomy Left    PARATHYROIDECTOMY     RIGHT HEART CATH N/A 03/29/2019   Procedure: RIGHT HEART CATH;  Surgeon: [name], MD;  Location: [place] CV LAB;  Service: Cardiovascular;  Laterality: N/A;   Family History  Problem Relation Age of Onset   Diabetes Mother        2003   Lung cancer Father    Diabetes Sister    Hypertension Sister    Heart disease Sister    Cancer Sister    Bone cancer Brother    Prostate cancer Neg Hx    Bladder Cancer Neg Hx    Kidney cancer Neg Hx    Social History   Tobacco Use   Smoking status: Former    Types: Cigarettes   Smokeless tobacco: Former     Types: Chew  Substance Use Topics   Alcohol use: No   Allergies  Allergen Reactions   Penicillins Rash   Prior to Admission medications   Medication Sig Start Date End Date Taking? Authorizing Provider  apixaban (ELIQUIS) 5 MG TABS tablet Take 1 tablet (5 mg total) by mouth 2 (two) times daily. Must make appt with Dr. Rockey Situ for refills 02/09/22  Yes Gollan, Kathlene November, MD  Budeson-Glycopyrrol-Formoterol (BREZTRI AEROSPHERE) 160-9-4.8 MCG/ACT AERO Inhale 2 puffs into the lungs 2 (two) times daily. 03/08/22  Yes McDonough, Lauren K, PA-C  carvedilol (COREG) 12.5 MG tablet Take 1 tablet (12.5 mg total) by mouth 2 (two) times daily. 02/25/22  Yes Darylene Price A, FNP  cyanocobalamin 1000 MCG tablet Take 1 tablet (1,000 mcg total) by mouth daily. 12/13/21  Yes Lavera Guise, MD  furosemide (LASIX) 40 MG tablet Take 1 tablet (40 mg total) by mouth daily. Take lasix 37m po daily 02/25/22  Yes Taelyn Nemes A, FNP  glimepiride (AMARYL) 2 MG tablet Take 1 tablet (2 mg total) by mouth daily. 02/04/22  Yes Lavera Guise, MD  hydrALAZINE (APRESOLINE) 100 MG tablet Take 100 mg by mouth 3 (three) times daily. 12/30/21  Yes [provider]  insulin glargine, 2 Unit Dial, (TOUJEO MAX SOLOSTAR) 300 UNIT/ML Solostar Pen Inject 20 units in am 03/03/22  Yes McDonough, Lauren K, PA-C  ipratropium-albuterol (DUONEB) 0.5-2.5 (3) MG/3ML SOLN Take 3 mLs by nebulization every 6 (six) hours as needed. 06/29/21  Yes Danford, Suann Larry, MD  omeprazole (PRILOSEC) 40 MG capsule Take 1 capsule once in the morning and once in the evening 12/13/21  Yes Lavera Guise, MD  potassium chloride (KLOR-CON) 20 MEQ packet Take 20 mEq by mouth daily.   Yes [provider]  rosuvastatin (CRESTOR) 10 MG tablet Take 1 tablet (10 mg total) by mouth at bedtime. 08/11/21  Yes Abernathy, Yetta Flock, NP  Accu-Chek Softclix Lancets lancets Use to check blood sugars 4 times a day   E11.65 08/20/21   [name], NP  Blood Glucose  Monitoring Suppl (TRUE METRIX AIR GLUCOSE METER) DEVI 1 Device by Does not apply route 2 (two) times daily. 04/13/18   [name], NP  cetirizine (ZYRTEC) 10 MG tablet Take 10 mg by mouth daily as needed for allergies. Buys OTC    [provider]  glucose blood (ACCU-CHEK GUIDE) test strip Use to check blood sugars 4 times a day   E11.65 12/13/21   Lavera Guise, MD  NOVOFINE PEN NEEDLE 32G X 6 MM MISC Use as directed 12/12/21   Lavera Guise, MD    Review of Systems  Constitutional:  Positive for fatigue. Negative for appetite change and fever.  HENT:  Positive for hearing loss. Negative for congestion and sore throat.   Eyes: Negative.   Respiratory:  Positive for shortness of breath. Negative for cough and wheezing.   Cardiovascular:  Negative for chest pain, palpitations and leg swelling.  Gastrointestinal:  Positive for diarrhea and nausea. Negative for abdominal distention and abdominal pain.  Endocrine: Negative.   Genitourinary: Negative.   Musculoskeletal:  Positive for arthralgias (left leg). Negative for back pain and neck pain.  Skin: Negative.   Allergic/Immunologic: Negative.   Neurological:  Positive for weakness (in legs). Negative for dizziness and light-headedness.  Hematological:  Negative for adenopathy. Does not bruise/bleed easily.  Psychiatric/Behavioral:  Negative for dysphoric mood and sleep disturbance (sleeping on 1 pillow with CPAP). The patient is nervous/anxious.    Vitals:   03/23/22 1053  BP: (!) 161/87  Pulse: 76  Resp: 18  SpO2: 100%  Weight: 234 lb 2 oz (106.2 kg)  Height: _0  (1.803 m)   Wt Readings from Last 3 Encounters:  03/23/22 234 lb 2 oz (106.2 kg)  03/03/22 236 lb 9.6 oz (107.3 kg)  02/22/22 240 lb (108.9 kg)   Lab Results  Component Value Date   CREATININE 2.43 (H) 02/22/2022   CREATININE 2.79 (H) 01/20/2022   CREATININE 2.50 (H) 01/11/2022   Physical Exam Vitals and nursing note reviewed. Exam conducted with a  chaperone present (paramedic).  Constitutional:      Appearance: Normal appearance.  HENT:     Head: Normocephalic and atraumatic.  Cardiovascular:     Rate and Rhythm: Normal rate and regular rhythm.  Pulmonary:     Effort: Pulmonary effort is normal. No respiratory distress.     Breath sounds: No wheezing or rales.  Abdominal:     General: There is no distension.     Palpations: Abdomen is soft.  Musculoskeletal:        General: No tenderness.     Cervical back: Normal range of motion and neck supple.     Right lower leg: No edema.     Left lower leg: No edema.  Skin:    General: Skin is warm and dry.  Neurological:     General: No focal deficit present.     Mental Status: He is alert and oriented to person, place, and time.  Psychiatric:        Mood and Affect: Mood is anxious.        Behavior: Behavior normal.    Assessment and Plan:  Chronic Heart failure with reduced ejection fraction- - NYHA II - euvolemic today - weighing daily; reminded to call for an overnight weight gain of > 2 pounds or a weekly weight gain of > 5 pounds - weight down 8 pounds from last visit here 2 months ago - not adding salt to his food - participating in paramedicine program and paramedic was present for the entire visit - saw cardiology Rockey Situ) 02/09/22 - on GDMT coreg  - was going to titrate up carvedilol but he brought some of his medications but not all so concerned about accuracy of list - renal function may limit other GDMT; may need nephrology referral - BNP 12/17/21 was 122.8 - PharmD reconciled medications with the patient - has received his flu vaccine for this season  2. HTN with CKD- - BP elevated (1161/87) but he is anxious and talking throughout the entire visit - saw PCP (McDonough) 03/03/22; emphasized that he call PCP regarding leg pain and that its unlikely that it's a blood clot - BMP 02/22/22 reviewed and showed Na 138, K 3.8, Cr 2.43, gfr 28  3. DM-  - A1C 7.0%  08/06/21  4: OSA- - wears CPAP with O2 HS, for "all hours I sleep, including naps"  5: Covid exposure- - ordered covid test but PAT doesn't do symptomatic patients - advised patient that he could go to urgent care or get home test from pharmacy and he says that he's "not really worried about it" although he continued to talk about this during his visit   Medication bottles reviewed.   Return in 2 months, sooner if needed.

## 2022-03-23 NOTE — Patient Instructions (Signed)
Continue weighing daily and call for an overnight weight gain of 3 pounds or more or a weekly weight gain of more than 5 pounds.   If you have voicemail, please make sure your mailbox is cleaned out so that we may leave a message and please make sure to listen to any voicemails.     

## 2022-03-23 NOTE — Progress Notes (Signed)
Today met with Larry Callahan and advised been trying to reach him.  I have left messages and he never returns my phone call.  He said he lost my number.  Gave him my number again.  Advised him to answer the phone if home.  He complains today of left leg hurting and possibly with covid.  He states a woman at his church has covid and another has a blood clot.  He states he may have both.  He has no redness or swelling in leg, Larry Callahan advised to followup with his PCP.  He states will go to pharmacy to take a covid test.  He has no signs of covid but he states has stomach issues.  He has no med changes.  Larry Callahan advised will increase his carvedilol next visit.  Will continue to try to visit for heart failure, diet and medication management.   [place] [phone]

## 2022-03-29 ENCOUNTER — Encounter: Payer: Self-pay | Admitting: Nurse Practitioner

## 2022-03-29 ENCOUNTER — Ambulatory Visit (INDEPENDENT_AMBULATORY_CARE_PROVIDER_SITE_OTHER): Payer: Medicare PPO | Admitting: Nurse Practitioner

## 2022-03-29 ENCOUNTER — Other Ambulatory Visit: Payer: Self-pay | Admitting: Nurse Practitioner

## 2022-03-29 VITALS — BP 140/85 | HR 73 | Temp 97.3°F | Resp 16 | Ht 71.0 in | Wt 236.0 lb

## 2022-03-29 DIAGNOSIS — J449 Chronic obstructive pulmonary disease, unspecified: Secondary | ICD-10-CM

## 2022-03-29 DIAGNOSIS — E1122 Type 2 diabetes mellitus with diabetic chronic kidney disease: Secondary | ICD-10-CM | POA: Diagnosis not present

## 2022-03-29 DIAGNOSIS — N184 Chronic kidney disease, stage 4 (severe): Secondary | ICD-10-CM | POA: Diagnosis not present

## 2022-03-29 DIAGNOSIS — Z760 Encounter for issue of repeat prescription: Secondary | ICD-10-CM

## 2022-03-29 DIAGNOSIS — Z794 Long term (current) use of insulin: Secondary | ICD-10-CM

## 2022-03-29 DIAGNOSIS — Z0001 Encounter for general adult medical examination with abnormal findings: Secondary | ICD-10-CM | POA: Diagnosis not present

## 2022-03-29 DIAGNOSIS — I700 Atherosclerosis of aorta: Secondary | ICD-10-CM

## 2022-03-29 DIAGNOSIS — R300 Dysuria: Secondary | ICD-10-CM

## 2022-03-29 MED ORDER — NOVOFINE PEN NEEDLE 32G X 6 MM MISC: 5 refills | Status: DC

## 2022-03-29 MED ORDER — BREZTRI AEROSPHERE 160-9-4.8 MCG/ACT IN AERO: 2.0000 | INHALATION_SPRAY | Freq: Two times a day (BID) | RESPIRATORY_TRACT | 3 refills | Status: DC

## 2022-03-29 MED ORDER — ROSUVASTATIN CALCIUM 10 MG PO TABS: 10.0000 mg | ORAL_TABLET | Freq: Every day | ORAL | 1 refills | Status: DC

## 2022-03-29 MED ORDER — GLIMEPIRIDE 2 MG PO TABS: 2.0000 mg | ORAL_TABLET | Freq: Every day | ORAL | 5 refills | Status: DC

## 2022-03-29 NOTE — Progress Notes (Signed)
[facility] [place], [place] [address]  Internal MEDICINE  Office Visit Note  Patient Name: Larry Callahan  [id number]  [id number]  Date of Service: 03/29/2022  Chief Complaint  Patient presents with   Medicare Wellness   Gastroesophageal Reflux   Diabetes   Hyperlipidemia   Hypertension    HPI Larry Callahan presents for an annual well visit and physical exam.  Well-appearing 65 year old male with diabetes, heart failure, AFIB, OSA, COPD, GERD, hypertension, CKD and high cholesterol.  Colonoscopy done in 2020 A1c done on 9/21 Urine microalbumin ordered Need to see eye doc Foot exam done today.      Current Medication: Outpatient Encounter Medications as of 03/29/2022  Medication Sig   Accu-Chek Softclix Lancets lancets Use to check blood sugars 4 times a day   E11.65   apixaban (ELIQUIS) 5 MG TABS tablet Take 1 tablet (5 mg total) by mouth 2 (two) times daily. Must make appt with Dr. Rockey Situ for refills   Blood Glucose Monitoring Suppl (TRUE METRIX AIR GLUCOSE METER) DEVI 1 Device by Does not apply route 2 (two) times daily.   carvedilol (COREG) 12.5 MG tablet Take 1 tablet (12.5 mg total) by mouth 2 (two) times daily.   cetirizine (ZYRTEC) 10 MG tablet Take 10 mg by mouth daily as needed for allergies. Buys OTC   cyanocobalamin 1000 MCG tablet Take 1 tablet (1,000 mcg total) by mouth daily.   furosemide (LASIX) 40 MG tablet Take 1 tablet (40 mg total) by mouth daily. Take lasix 32m po daily   glucose blood (ACCU-CHEK GUIDE) test strip Use to check blood sugars 4 times a day   E11.65   hydrALAZINE (APRESOLINE) 100 MG tablet Take 100 mg by mouth 3 (three) times daily.   insulin glargine, 2 Unit Dial, (TOUJEO MAX SOLOSTAR) 300 UNIT/ML Solostar Pen Inject 20 units in am   ipratropium-albuterol (DUONEB) 0.5-2.5 (3) MG/3ML SOLN Take 3 mLs by nebulization every 6 (six) hours as needed.   omeprazole (PRILOSEC) 40 MG capsule Take 1 capsule once in the  morning and once in the evening   potassium chloride (KLOR-CON) 20 MEQ packet Take 20 mEq by mouth daily.   [DISCONTINUED] Budeson-Glycopyrrol-Formoterol (BREZTRI AEROSPHERE) 160-9-4.8 MCG/ACT AERO Inhale 2 puffs into the lungs 2 (two) times daily.   [DISCONTINUED] glimepiride (AMARYL) 2 MG tablet Take 1 tablet (2 mg total) by mouth daily.   [DISCONTINUED] NOVOFINE PEN NEEDLE 32G X 6 MM MISC Use as directed   [DISCONTINUED] rosuvastatin (CRESTOR) 10 MG tablet Take 1 tablet (10 mg total) by mouth at bedtime.   Budeson-Glycopyrrol-Formoterol (BREZTRI AEROSPHERE) 160-9-4.8 MCG/ACT AERO Inhale 2 puffs into the lungs 2 (two) times daily.   glimepiride (AMARYL) 2 MG tablet Take 1 tablet (2 mg total) by mouth daily.   Insulin Pen Needle (NOVOFINE PEN NEEDLE) 32G X 6 MM MISC Use as directed with toujeo pen   rosuvastatin (CRESTOR) 10 MG tablet Take 1 tablet (10 mg total) by mouth at bedtime.   No facility-administered encounter medications on file as of 03/29/2022.    Surgical History: Past Surgical History:  Procedure Laterality Date   BACK SURGERY     CARDIAC CATHETERIZATION     CHOLECYSTECTOMY     COLONOSCOPY WITH PROPOFOL N/A 04/12/2019   Procedure: COLONOSCOPY WITH PROPOFOL;  Surgeon: VLin Landsman MD;  [facility];  Service: Gastroenterology;  Laterality: N/A;   ESOPHAGOGASTRODUODENOSCOPY N/A 04/12/2019   Procedure: ESOPHAGOGASTRODUODENOSCOPY (EGD);  Surgeon: VLin Landsman MD;  Location: [name] ENDOSCOPY;  Service: Gastroenterology;  Laterality: N/A;   ESOPHAGOGASTRODUODENOSCOPY (EGD) WITH PROPOFOL N/A 11/21/2019   Procedure: ESOPHAGOGASTRODUODENOSCOPY (EGD) WITH PROPOFOL;  Surgeon: [name], MD;  [facility] ENDOSCOPY;  Service: Gastroenterology;  Laterality: N/A;   ESOPHAGOGASTRODUODENOSCOPY (EGD) WITH PROPOFOL N/A 01/11/2022   Procedure: ESOPHAGOGASTRODUODENOSCOPY (EGD) WITH PROPOFOL;  Surgeon: [name], MD;  Location: [name] ENDOSCOPY;  Service:  Endoscopy;  Laterality: N/A;   FLEXIBLE BRONCHOSCOPY Bilateral 05/17/2019   Procedure: FLEXIBLE BRONCHOSCOPY;  Surgeon: [name], MD;  Location: [name] ORS;  Service: Pulmonary;  Laterality: Bilateral;   left arm surgery     nephrectomy Left    PARATHYROIDECTOMY     RIGHT HEART CATH N/A 03/29/2019   Procedure: RIGHT HEART CATH;  Surgeon: [name], MD;  Location: [facility] CV LAB;  Service: Cardiovascular;  Laterality: N/A;    Medical History: Past Medical History:  Diagnosis Date   Allergy    Atrial flutter ([place])    a. Dx 12/2020--CHA2DS2VASc = 5-->eliquis.   Chronic combined systolic (congestive) and diastolic (congestive) heart failure ([place])    a. 04/2019 Echo: EF 45-50%; b. 02/2019 Echo: EF 55-60%; c. 09/2020 Echo: EF 35-40%, glob HK. Nl RV size/fxn. Mild MR; d. 07/2021 Echo: EF 40-45%, mild LVH, low-nl RV fxn, no MR.   CKD (chronic kidney disease), stage III (HCC)    COPD (chronic obstructive pulmonary disease) ([place])    Coronary artery disease    a. 2011 Cath: nonobs dzs; b. 09/2020 MV: EF 38%. No signif ischemia. ? inf MI vs diaph attenuation. GI uptake artifact.   Diabetes mellitus without complication (HCC)    GERD (gastroesophageal reflux disease)    History of CVA (cerebrovascular accident) 09/25/2020   Hyperlipidemia    Hypertension    Morbid (severe) obesity due to excess calories ([place]) 06/09/2017   Morbid obesity ([place])    NICM (nonischemic cardiomyopathy) ([place])    a. 04/2019 Echo: EF 45-50%; b. 02/2019 Echo: EF 55-60%; c. 09/2020 Echo: EF 35-40%, glob HK; d. 09/2020 MV: No ischemia. ? inf infarct vs attenuation; d. 07/2021 Echo: EF 40-45%.   OSA (obstructive sleep apnea)    Pneumonia due to COVID-19 virus 01/30/2020   Stage 3b chronic kidney disease ([place])    Syncope and collapse 11/16/2020    Family History: Family History  Problem Relation Age of Onset   Diabetes Mother        2003   Lung cancer Father    Diabetes Sister    Hypertension Sister    Heart  disease Sister    Cancer Sister    Bone cancer Brother    Prostate cancer Neg Hx    Bladder Cancer Neg Hx    Kidney cancer Neg Hx     Social History   Socioeconomic History   Marital status: Single    Spouse name: Not on file   Number of children: Not on file   Years of education: Not on file   Highest education level: Not on file  Occupational History   Not on file  Tobacco Use   Smoking status: Former    Types: Cigarettes   Smokeless tobacco: Former    Types: Nurse, children's Use: Never used  Substance and Sexual Activity   Alcohol use: No   Drug use: No   Sexual activity: Not Currently  Other Topics Concern   Not on file  Social History Narrative   Not on file   Social Determinants  of Health   Financial Resource Strain: Low Risk  (05/12/2021)   Overall Financial Resource Strain (CARDIA)    Difficulty of Paying Living Expenses: Not very hard  Food Insecurity: Not on file  Transportation Needs: Not on file  Physical Activity: Not on file  Stress: Not on file  Social Connections: Not on file  Intimate Partner Violence: Not on file      Review of Systems  Constitutional:  Negative for activity change, appetite change, chills, fatigue, fever and unexpected weight change.  HENT: Negative.  Negative for congestion, ear pain, rhinorrhea, sore throat and trouble swallowing.   Eyes: Negative.   Respiratory: Negative.  Negative for cough, chest tightness, shortness of breath and wheezing.   Cardiovascular: Negative.  Negative for chest pain.  Gastrointestinal: Negative.  Negative for abdominal pain, blood in stool, constipation, diarrhea, nausea and vomiting.  Endocrine: Negative.   Genitourinary: Negative.  Negative for difficulty urinating, dysuria, frequency, hematuria and urgency.  Musculoskeletal: Negative.  Negative for arthralgias, back pain, joint swelling, myalgias and neck pain.  Skin: Negative.  Negative for rash and wound.   Allergic/Immunologic: Negative.  Negative for immunocompromised state.  Neurological: Negative.  Negative for dizziness, seizures, numbness and headaches.  Hematological: Negative.   Psychiatric/Behavioral: Negative.  Negative for behavioral problems, self-injury and suicidal ideas. The patient is not nervous/anxious.     Vital Signs: BP (!) 140/85 Comment: 153/98  Pulse 73   Temp (!) 97.3 F (36.3 C)   Resp 16   Ht _0  (1.803 m)   Wt 236 lb (107 kg)   SpO2 99%   BMI 32.92 kg/m    Physical Exam Vitals reviewed.  Constitutional:      General: He is awake. He is not in acute distress.    Appearance: Normal appearance. He is well-developed. He is morbidly obese. He is not ill-appearing or diaphoretic.  HENT:     Head: Normocephalic and atraumatic.     Right Ear: Tympanic membrane, ear canal and external ear normal.     Left Ear: Tympanic membrane, ear canal and external ear normal.     Nose: Nose normal. No congestion or rhinorrhea.     Mouth/Throat:     Lips: Pink.     Mouth: Mucous membranes are moist.     Pharynx: Oropharynx is clear. Uvula midline. No oropharyngeal exudate or posterior oropharyngeal erythema.  Eyes:     General: Lids are normal. Vision grossly intact. Gaze aligned appropriately. No scleral icterus.       Right eye: No discharge.        Left eye: No discharge.     Extraocular Movements: Extraocular movements intact.     Conjunctiva/sclera: Conjunctivae normal.     Pupils: Pupils are equal, round, and reactive to light.     Funduscopic exam:    Right eye: Red reflex present.        Left eye: Red reflex present. Neck:     Thyroid: No thyromegaly.     Vascular: No carotid bruit or JVD.     Trachea: Trachea and phonation normal. No tracheal deviation.  Cardiovascular:     Rate and Rhythm: Normal rate and regular rhythm.     Pulses:          Carotid pulses are 3+ on the right side and 3+ on the left side.      Radial pulses are 2+ on the right side  and 2+ on the left side.  Dorsalis pedis pulses are 2+ on the right side and 2+ on the left side.       Posterior tibial pulses are 2+ on the right side and 2+ on the left side.     Heart sounds: Normal heart sounds, S1 normal and S2 normal. No murmur heard.    No friction rub. No gallop.  Pulmonary:     Effort: Pulmonary effort is normal. No accessory muscle usage or respiratory distress.     Breath sounds: Normal breath sounds and air entry. No stridor. No wheezing or rales.  Chest:     Chest wall: No tenderness.  Abdominal:     General: Bowel sounds are normal. There is no distension.     Palpations: Abdomen is soft. There is no shifting dullness, fluid wave, mass or pulsatile mass.     Tenderness: There is no abdominal tenderness. There is no guarding or rebound.  Musculoskeletal:        General: No tenderness or deformity. Normal range of motion.     Cervical back: Normal range of motion and neck supple.     Right lower leg: Edema present.     Left lower leg: Edema present.     Right foot: Normal range of motion. No deformity, bunion, Charcot foot, foot drop or prominent metatarsal heads.     Left foot: Normal range of motion. No deformity, bunion, Charcot foot, foot drop or prominent metatarsal heads.  Feet:     Right foot:     Protective Sensation: 6 sites tested.  6 sites sensed.     Skin integrity: Callus and dry skin present. No ulcer, blister, skin breakdown, erythema, warmth or fissure.     Toenail Condition: Right toenails are abnormally thick and long. Fungal disease present.    Left foot:     Protective Sensation: 6 sites tested.  6 sites sensed.     Skin integrity: Callus and dry skin present. No ulcer, blister, skin breakdown, erythema, warmth or fissure.     Toenail Condition: Left toenails are abnormally thick and long. Fungal disease present. Lymphadenopathy:     Cervical: No cervical adenopathy.  Skin:    General: Skin is warm and dry.     Capillary Refill:  Capillary refill takes less than 2 seconds.     Coloration: Skin is not pale.     Findings: No erythema or rash.  Neurological:     Mental Status: He is alert and oriented to person, place, and time.     Cranial Nerves: No cranial nerve deficit.     Motor: No abnormal muscle tone.     Coordination: Coordination normal.     Gait: Gait normal.     Deep Tendon Reflexes: Reflexes are normal and symmetric.  Psychiatric:        Mood and Affect: Mood and affect normal.        Behavior: Behavior normal. Behavior is cooperative.        Thought Content: Thought content normal.        Judgment: Judgment normal.       Assessment/Plan: 1. Encounter for general adult medical examination with abnormal findings Age-appropriate preventive screenings and vaccinations discussed, annual physical exam completed. Routine labs for health maintenance deferred fr now, has had a lot of labs done in hospital recently. PHM updated.   2. Diabetes mellitus with stage 4 chronic kidney disease ([place]) A1c done in September, urine microalbumin sent off today - Urine Microalbumin w/creat. ratio  3.  Dysuria Routine urinalysis done - Urinalysis, Routine w reflex microscopic - Microscopic Examination  4. Medication refill - rosuvastatin (CRESTOR) 10 MG tablet; Take 1 tablet (10 mg total) by mouth at bedtime.  Dispense: 90 tablet; Refill: 1 - Insulin Pen Needle (NOVOFINE PEN NEEDLE) 32G X 6 MM MISC; Use as directed with toujeo pen  Dispense: 100 each; Refill: 5 - glimepiride (AMARYL) 2 MG tablet; Take 1 tablet (2 mg total) by mouth daily.  Dispense: 30 tablet; Refill: 5 - Budeson-Glycopyrrol-Formoterol (BREZTRI AEROSPHERE) 160-9-4.8 MCG/ACT AERO; Inhale 2 puffs into the lungs 2 (two) times daily.  Dispense: 2 each; Refill: 3      General Counseling: Xaine verbalizes understanding of the findings of todays visit and agrees with plan of treatment. I have discussed any further diagnostic evaluation that may be  needed or ordered today. We also reviewed his medications today. he has been encouraged to call the office with any questions or concerns that should arise related to todays visit.    Orders Placed This Encounter  Procedures   Urine Microalbumin w/creat. ratio   Urinalysis, Routine w reflex microscopic    Meds ordered this encounter  Medications   rosuvastatin (CRESTOR) 10 MG tablet    Sig: Take 1 tablet (10 mg total) by mouth at bedtime.    Dispense:  90 tablet    Refill:  1   Insulin Pen Needle (NOVOFINE PEN NEEDLE) 32G X 6 MM MISC    Sig: Use as directed with toujeo pen    Dispense:  100 each    Refill:  5   glimepiride (AMARYL) 2 MG tablet    Sig: Take 1 tablet (2 mg total) by mouth daily.    Dispense:  30 tablet    Refill:  5   Budeson-Glycopyrrol-Formoterol (BREZTRI AEROSPHERE) 160-9-4.8 MCG/ACT AERO    Sig: Inhale 2 puffs into the lungs 2 (two) times daily.    Dispense:  2 each    Refill:  3    Return in about 3 months (around 06/29/2022) for F/U, Recheck A1C w/DFK only .   Total time spent:30 Minutes Time spent includes review of chart, medications, test results, and follow up plan with the patient.   [place] Controlled Substance Database was reviewed by me.  This patient was seen by [name], FNP-C in collaboration with Dr. Clayborn Bigness as a part of collaborative care agreement.  Ojani Berenson R. Valetta Fuller, MSN, FNP-C Internal medicine

## 2022-03-30 LAB — URINALYSIS, ROUTINE W REFLEX MICROSCOPIC
Bilirubin, UA: NEGATIVE
Glucose, UA: NEGATIVE
Ketones, UA: NEGATIVE
Leukocytes,UA: NEGATIVE
Nitrite, UA: NEGATIVE
RBC, UA: NEGATIVE
Specific Gravity, UA: 1.007 (ref 1.005–1.030)
Urobilinogen, Ur: 0.2 mg/dL (ref 0.2–1.0)
pH, UA: 5.5 (ref 5.0–7.5)

## 2022-03-30 LAB — MICROSCOPIC EXAMINATION
Bacteria, UA: NONE SEEN
Casts: NONE SEEN /lpf
Epithelial Cells (non renal): NONE SEEN /hpf (ref 0–10)
RBC, Urine: NONE SEEN /hpf (ref 0–2)
WBC, UA: NONE SEEN /hpf (ref 0–5)

## 2022-03-30 LAB — MICROALBUMIN / CREATININE URINE RATIO
Creatinine, Urine: 23 mg/dL
Microalb/Creat Ratio: 3097 mg/g creat — ABNORMAL HIGH (ref 0–29)
Microalbumin, Urine: 712.4 ug/mL

## 2022-04-19 NOTE — Progress Notes (Signed)
Patient ID: Larry Callahan, male   DOB: 01/28/1956, 66 y.o.   MRN: [id number] [place] - PHARMACIST COUNSELING NOTE  Guideline-Directed Medical Therapy/Evidence Based Medicine  ACE/ARB/ARNI:  n/a - limited duet o CKD and compliance  issues Beta Blocker: Carvedilol 12.5 mg twice daily (unsure patient is taking as prescribed) Aldosterone Antagonist:  none Diuretic: Furosemide 40 mg daily SGLT2i:  none  Adherence Assessment  Do you ever forget to take your medication? '[]'$ Yes '[x]'$ No  Do you ever skip doses due to side effects? '[]'$ Yes '[x]'$ No  Do you have trouble affording your medicines? '[]'$ Yes '[x]'$ No  Are you ever unable to pick up your medication due to transportation difficulties? '[]'$ Yes '[x]'$ No  Do you ever stop taking your medications because you don't believe they are helping? '[]'$ Yes '[x]'$ No  Do you check your weight daily? '[]'$ Yes '[x]'$ No   Adherence strategy: pill box ([facility] community outreach)  Barriers to obtaining medications: none  Vital signs: HR 76, BP 161/87, weight (pounds) 234 lbs ECHO: Date 08/08/2021, EF 40-45%, HFimpEF,mild to moderately decreased function of the left ventricle  Date 09/2020 EF 35-40%     Latest Ref Rng & Units 02/22/2022    4:32 PM 01/20/2022    7:07 PM 01/11/2022    4:22 AM  BMP  Glucose 70 - 99 mg/dL 223  155  96   BUN 8 - 23 mg/dL 40  50  35   Creatinine 0.61 - 1.24 mg/dL 2.43  2.79  2.50   Sodium 135 - 145 mmol/L 138  140  138   Potassium 3.5 - 5.1 mmol/L 3.8  4.1  4.2   Chloride 98 - 111 mmol/L 107  114  111   CO2 22 - 32 mmol/L '21  20  23   '$ Calcium 8.9 - 10.3 mg/dL 8.7  8.8  8.6     Past Medical History:  Diagnosis Date   Allergy    Atrial flutter ([place])    a. Dx 12/2020--CHA2DS2VASc = 5-->eliquis.   Chronic combined systolic (congestive) and diastolic (congestive) heart failure ([place])    a. 04/2019 Echo: EF 45-50%; b. 02/2019 Echo: EF 55-60%; c. 09/2020 Echo: EF 35-40%, glob HK. Nl RV size/fxn.  Mild MR; d. 07/2021 Echo: EF 40-45%, mild LVH, low-nl RV fxn, no MR.   CKD (chronic kidney disease), stage III (HCC)    COPD (chronic obstructive pulmonary disease) ([place])    Coronary artery disease    a. 2011 Cath: nonobs dzs; b. 09/2020 MV: EF 38%. No signif ischemia. ? inf MI vs diaph attenuation. GI uptake artifact.   Diabetes mellitus without complication (HCC)    GERD (gastroesophageal reflux disease)    History of CVA (cerebrovascular accident) 09/25/2020   Hyperlipidemia    Hypertension    Morbid (severe) obesity due to excess calories ([place]) 06/09/2017   Morbid obesity ([place])    NICM (nonischemic cardiomyopathy) ([place])    a. 04/2019 Echo: EF 45-50%; b. 02/2019 Echo: EF 55-60%; c. 09/2020 Echo: EF 35-40%, glob HK; d. 09/2020 MV: No ischemia. ? inf infarct vs attenuation; d. 07/2021 Echo: EF 40-45%.   OSA (obstructive sleep apnea)    Pneumonia due to COVID-19 virus 01/30/2020   Stage 3b chronic kidney disease ([place])    Syncope and collapse 11/16/2020    ASSESSMENT 66 year old male who presents to the HF clinic for follow up. PMH includes nonischemic cardiomyopathy, COPD, sleep apnea, chronic kidney disease stage IV, chronic anemia, hypertension, and diabetes mellitus.  [facility]Rec completed. Noted his medication list may not be accurate and paramedic will check medication on incoming visit. Patient also reports he may have being exposed to [place] and unsure if currently infected. Encouraged patient to being all him medication to next appointment.  PLAN  Continue to titrate carvedilol Consider adding SGLT2i during next office visit  Time spent: 20 minutes  Avyaan Summer Rodriguez-Guzman PharmD, BCPS 04/19/2022 7:57 AM   Current Outpatient Medications:    Accu-Chek Softclix Lancets lancets, Use to check blood sugars 4 times a day   E11.65, Disp: 400 each, Rfl: 3   apixaban (ELIQUIS) 5 MG TABS tablet, Take 1 tablet (5 mg total) by mouth 2 (two) times daily. Must make appt with Dr. Rockey Situ for refills,  Disp: 90 tablet, Rfl: 3   Blood Glucose Monitoring Suppl (TRUE METRIX AIR GLUCOSE METER) DEVI, 1 Device by Does not apply route 2 (two) times daily., Disp: 1 Device, Rfl: 0   Budeson-Glycopyrrol-Formoterol (BREZTRI AEROSPHERE) 160-9-4.8 MCG/ACT AERO, Inhale 2 puffs into the lungs 2 (two) times daily., Disp: 2 each, Rfl: 3   carvedilol (COREG) 12.5 MG tablet, Take 1 tablet (12.5 mg total) by mouth 2 (two) times daily., Disp: 180 tablet, Rfl: 3   cetirizine (ZYRTEC) 10 MG tablet, Take 10 mg by mouth daily as needed for allergies. Buys OTC, Disp: , Rfl:    cyanocobalamin 1000 MCG tablet, Take 1 tablet (1,000 mcg total) by mouth daily., Disp: 90 tablet, Rfl: 1   furosemide (LASIX) 40 MG tablet, Take 1 tablet (40 mg total) by mouth daily. Take lasix '40mg'$  po daily, Disp: 90 tablet, Rfl: 3   glimepiride (AMARYL) 2 MG tablet, Take 1 tablet (2 mg total) by mouth daily., Disp: 30 tablet, Rfl: 5   glucose blood (ACCU-CHEK GUIDE) test strip, Use to check blood sugars 4 times a day   E11.65, Disp: 400 each, Rfl: 3   hydrALAZINE (APRESOLINE) 100 MG tablet, Take 100 mg by mouth 3 (three) times daily., Disp: , Rfl:    insulin glargine, 2 Unit Dial, (TOUJEO MAX SOLOSTAR) 300 UNIT/ML Solostar Pen, Inject 20 units in am, Disp: 9 mL, Rfl: 3   Insulin Pen Needle (NOVOFINE PEN NEEDLE) 32G X 6 MM MISC, Use as directed with toujeo pen, Disp: 100 each, Rfl: 5   ipratropium-albuterol (DUONEB) 0.5-2.5 (3) MG/3ML SOLN, Take 3 mLs by nebulization every 6 (six) hours as needed., Disp: 270 mL, Rfl: 3   omeprazole (PRILOSEC) 40 MG capsule, Take 1 capsule once in the morning and once in the evening, Disp: 180 capsule, Rfl: 1   potassium chloride (KLOR-CON) 20 MEQ packet, Take 20 mEq by mouth daily., Disp: , Rfl:    rosuvastatin (CRESTOR) 10 MG tablet, Take 1 tablet (10 mg total) by mouth at bedtime., Disp: 90 tablet, Rfl: 0   rosuvastatin (CRESTOR) 10 MG tablet, Take 1 tablet (10 mg total) by mouth at bedtime., Disp: 90 tablet, Rfl:  1   TOUJEO SOLOSTAR 300 UNIT/ML Solostar Pen, Inject 26 Units into the skin daily., Disp: 4.5 mL, Rfl: 0  MEDICATION ADHERENCES TIPS AND STRATEGIES Taking medication as prescribed improves patient outcomes in heart failure (reduces hospitalizations, improves symptoms, increases survival) Side effects of medications can be managed by decreasing doses, switching agents, stopping drugs, or adding additional therapy. Please let someone in the [facility] know if you have having bothersome side effects so we can modify your regimen. Do not alter your medication regimen without talking to [name].  Medication reminders can help patients  remember to take drugs on time. If you are missing or forgetting doses you can try linking behaviors, using pill boxes, or an electronic reminder like an alarm on your phone or an app. Some people can also get automated phone calls as medication reminders.

## 2022-05-01 IMAGING — DX DG CHEST 1V PORT
1 series · 1 of 1 positions shown · non-contrast
Comparison: Radiographs 07/20/2020 and 07/11/2020.  CT 01/24/2020.

CLINICAL DATA: Cough with chest pain and shortness of breath 412
hours.

EXAM:
PORTABLE CHEST 1 VIEW

[chest ap]
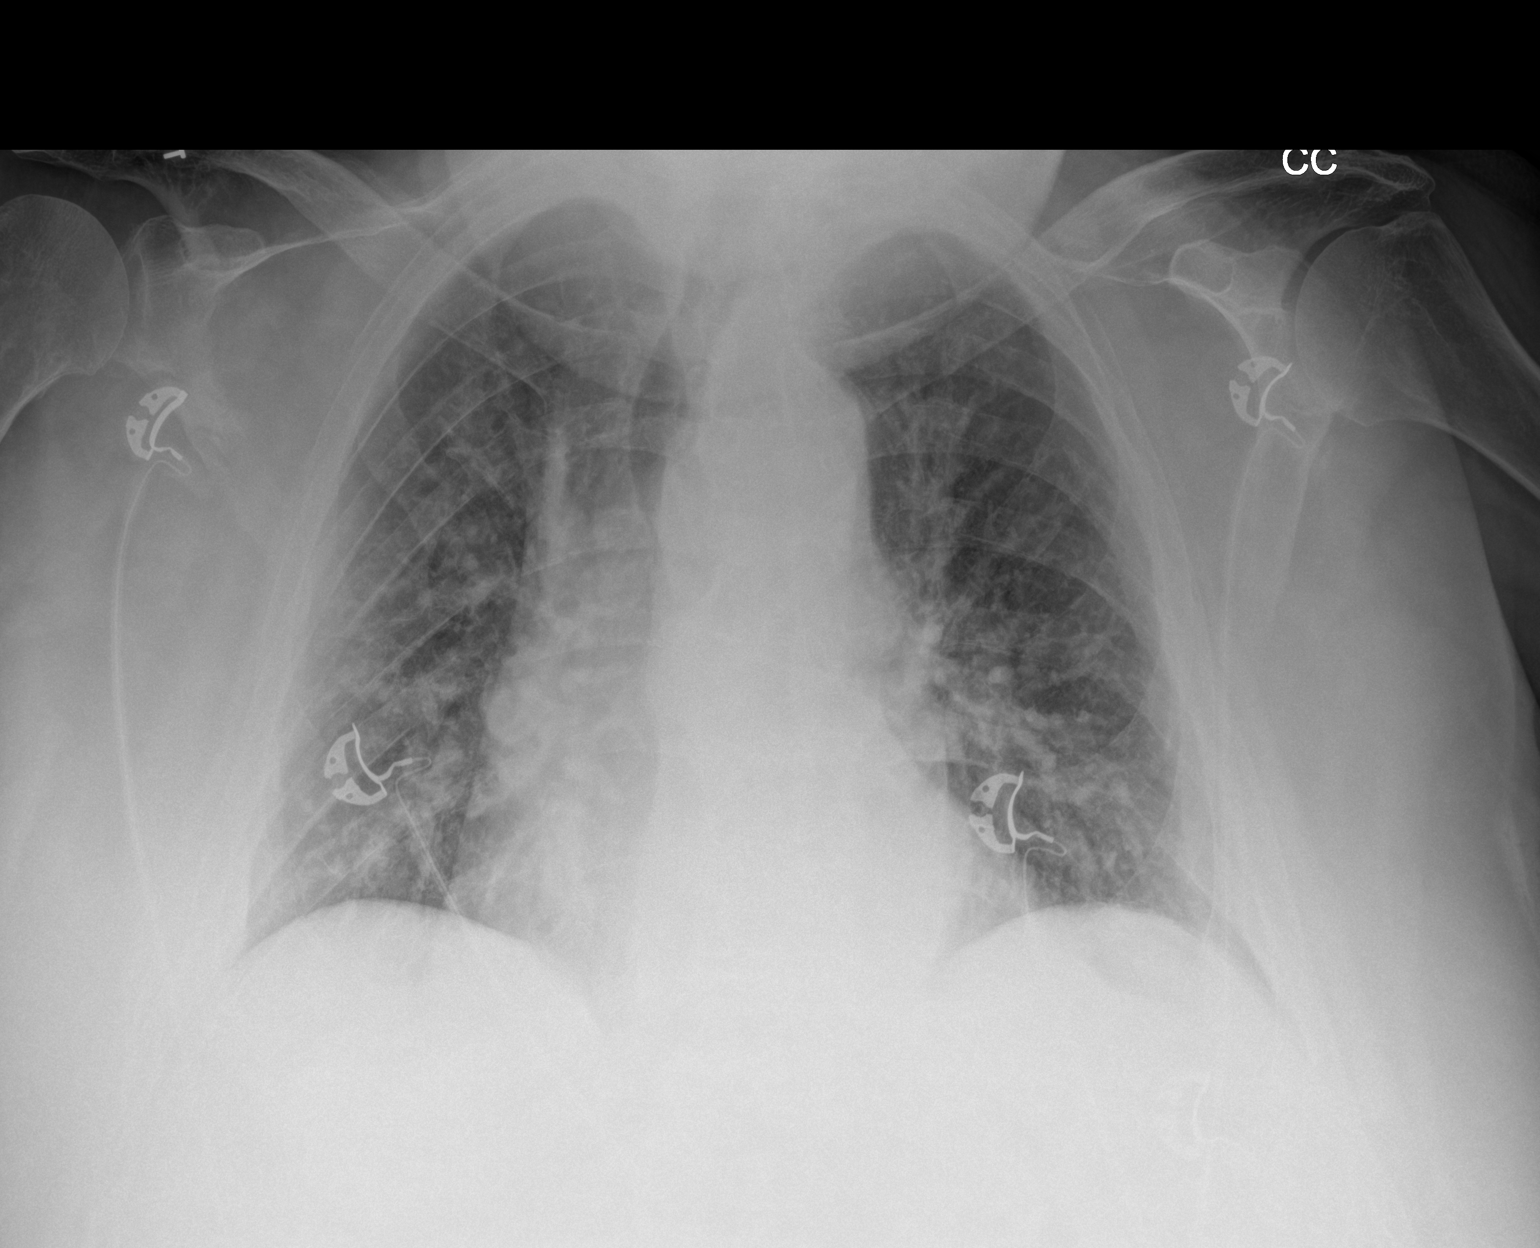

[1 of 1 positions shown; findings below may reference images not displayed]

FINDINGS: 7153 hours. Mild patient rotation to the right. The heart size and
mediastinal contours are stable. There is stable vascular crowding
and atelectasis at both lung bases. No edema, confluent airspace
opacity, pneumothorax or significant pleural effusion. The bones
appear unchanged. Telemetry leads overlie the chest.
IMPRESSION: Stable appearance of the chest with mild vascular crowding and
bibasilar atelectasis. No acute findings.

## 2022-05-04 ENCOUNTER — Emergency Department: Payer: Medicare PPO

## 2022-05-04 ENCOUNTER — Emergency Department
Admission: EM | Admit: 2022-05-04 | Discharge: 2022-05-04 | Disposition: A | Discharge status: Home / Self Care | Payer: Medicare PPO | Attending: Emergency Medicine | Admitting: Emergency Medicine

## 2022-05-04 ENCOUNTER — Other Ambulatory Visit: Payer: Self-pay

## 2022-05-04 ENCOUNTER — Telehealth: Payer: Self-pay | Admitting: Internal Medicine

## 2022-05-04 DIAGNOSIS — S5012XA Contusion of left forearm, initial encounter: Secondary | ICD-10-CM | POA: Diagnosis not present

## 2022-05-04 DIAGNOSIS — Y92009 Unspecified place in unspecified non-institutional (private) residence as the place of occurrence of the external cause: Secondary | ICD-10-CM | POA: Diagnosis not present

## 2022-05-04 DIAGNOSIS — S0003XA Contusion of scalp, initial encounter: Secondary | ICD-10-CM | POA: Diagnosis not present

## 2022-05-04 DIAGNOSIS — E1122 Type 2 diabetes mellitus with diabetic chronic kidney disease: Secondary | ICD-10-CM | POA: Insufficient documentation

## 2022-05-04 DIAGNOSIS — I130 Hypertensive heart and chronic kidney disease with heart failure and stage 1 through stage 4 chronic kidney disease, or unspecified chronic kidney disease: Secondary | ICD-10-CM | POA: Diagnosis not present

## 2022-05-04 DIAGNOSIS — S0990XA Unspecified injury of head, initial encounter: Secondary | ICD-10-CM | POA: Diagnosis not present

## 2022-05-04 DIAGNOSIS — D649 Anemia, unspecified: Secondary | ICD-10-CM | POA: Insufficient documentation

## 2022-05-04 DIAGNOSIS — J449 Chronic obstructive pulmonary disease, unspecified: Secondary | ICD-10-CM | POA: Insufficient documentation

## 2022-05-04 DIAGNOSIS — I509 Heart failure, unspecified: Secondary | ICD-10-CM | POA: Insufficient documentation

## 2022-05-04 DIAGNOSIS — W19XXXA Unspecified fall, initial encounter: Secondary | ICD-10-CM | POA: Diagnosis not present

## 2022-05-04 DIAGNOSIS — W010XXA Fall on same level from slipping, tripping and stumbling without subsequent striking against object, initial encounter: Secondary | ICD-10-CM | POA: Insufficient documentation

## 2022-05-04 DIAGNOSIS — N184 Chronic kidney disease, stage 4 (severe): Secondary | ICD-10-CM | POA: Diagnosis not present

## 2022-05-04 DIAGNOSIS — Z043 Encounter for examination and observation following other accident: Secondary | ICD-10-CM | POA: Diagnosis not present

## 2022-05-04 DIAGNOSIS — T68XXXA Hypothermia, initial encounter: Secondary | ICD-10-CM | POA: Diagnosis not present

## 2022-05-04 DIAGNOSIS — M5031 Other cervical disc degeneration,  high cervical region: Secondary | ICD-10-CM | POA: Diagnosis not present

## 2022-05-04 LAB — BASIC METABOLIC PANEL
Anion gap: 10 (ref 5–15)
BUN: 54 mg/dL — ABNORMAL HIGH (ref 8–23)
CO2: 19 mmol/L — ABNORMAL LOW (ref 22–32)
Calcium: 8.8 mg/dL — ABNORMAL LOW (ref 8.9–10.3)
Chloride: 110 mmol/L (ref 98–111)
Creatinine, Ser: 2.62 mg/dL — ABNORMAL HIGH (ref 0.61–1.24)
GFR, Estimated: 26 mL/min — ABNORMAL LOW (ref >60)
Glucose, Bld: 102 mg/dL — ABNORMAL HIGH (ref 70–99)
Potassium: 4.3 mmol/L (ref 3.5–5.1)
Sodium: 139 mmol/L (ref 135–145)

## 2022-05-04 LAB — CBG MONITORING, ED: Glucose-Capillary: 96 mg/dL (ref 70–99)

## 2022-05-04 LAB — CBC
HCT: 33.5 % — ABNORMAL LOW (ref 39.0–52.0)
Hemoglobin: 10.7 g/dL — ABNORMAL LOW (ref 13.0–17.0)
MCH: 27.2 pg (ref 26.0–34.0)
MCHC: 31.9 g/dL (ref 30.0–36.0)
MCV: 85.2 fL (ref 80.0–100.0)
Platelets: 193 10*3/uL (ref 150–400)
RBC: 3.93 MIL/uL — ABNORMAL LOW (ref 4.22–5.81)
RDW: 13.5 % (ref 11.5–15.5)
WBC: 5.3 10*3/uL (ref 4.0–10.5)
nRBC: 0 % (ref 0.0–0.2)

## 2022-05-04 NOTE — ED Provider Notes (Signed)
[facility] Provider Note    Event Date/Time   First MD Initiated Contact with Patient 05/04/22 1137     (approximate)   History   Fall   HPI  Larry Callahan is a 66 y.o. male   presents to the ED via EMS after a fall that occurred this morning due to his deck being wet.  Patient states he was able to crawl back into his home to call 911.  He reports pain to his left forearm and has had surgery to the same arm at [facility].  Patient also states he fell backwards and believes that he hit his head but did not lose consciousness.  He now complains of some dizziness and nauseous feeling.  Patient is tearful thinking that he is going to be "put away somewhere".  Patient has history of hypertension, COPD, CHF, chronic kidney disease stage IV, anemia secondary to chronic kidney disease, atherosclerotic heart disease, sleep apnea, diabetes, iron deficiency anemia, atrial flutter and history of dizziness in the past.      Physical Exam   Triage Vital Signs: ED Triage Vitals  Enc Vitals Group     BP 05/04/22 1055 (!) 159/99     Pulse Rate 05/04/22 1055 91     Resp 05/04/22 1055 18     Temp 05/04/22 1055 97.9 F (36.6 C)     Temp Source 05/04/22 1055 Oral     SpO2 05/04/22 1055 96 %     Weight 05/04/22 1040 250 lb (113.4 kg)     Height 05/04/22 1040 '5\' 11"'$  (1.803 m)     Head Circumference --      Peak Flow --      Pain Score 05/04/22 1039 6     Pain Loc --      Pain Edu? --      Excl. in [place]? --     Most recent vital signs: Vitals:   05/04/22 1055 05/04/22 1411  BP: (!) 159/99 (!) 140/98  Pulse: 91 92  Resp: 18 18  Temp: 97.9 F (36.6 C) 98 F (36.7 C)  SpO2: 96% 96%     General: Awake, no distress.  Alert, talkative, tearful while talking. CV:  Good peripheral perfusion.  Heart regular rate and rhythm. Resp:  Normal effort.  Lungs are clear bilaterally. Abd:  No distention.  Abdomen soft, nontender, bowel sounds present.  No abrasions  or noted trauma. Other:  Tenderness is noted on palpation generally of the left forearm but no deformity, discoloration or abrasions are noted.  Range of motion is slow and guarded secondary to discomfort.  Radial pulse present.  Motor and sensory function intact distal to the injury.  No tenderness is noted on palpation of the left elbow, humerus or shoulder.  Nontender on palpation of the lower extremities.   ED Results / Procedures / Treatments   Labs (all labs ordered are listed, but only abnormal results are displayed) Labs Reviewed  BASIC METABOLIC PANEL - Abnormal; Notable for the following components:      Result Value   CO2 19 (*)    Glucose, Bld 102 (*)    BUN 54 (*)    Creatinine, Ser 2.62 (*)    Calcium 8.8 (*)    GFR, Estimated 26 (*)    All other components within normal limits  CBC - Abnormal; Notable for the following components:   RBC 3.93 (*)    Hemoglobin 10.7 (*)    HCT  33.5 (*)    All other components within normal limits  CBG MONITORING, ED      RADIOLOGY  X-ray images of the left forearm were reviewed and interpreted by myself independently and was negative for acute fracture.  Radiology report was negative.  CT head and cervical spine per radiologist is negative for acute intracranial changes or fractures.   PROCEDURES:  Critical Care performed:   Procedures   MEDICATIONS ORDERED IN ED: Medications - No data to display   IMPRESSION / MDM / [place] / ED COURSE  I reviewed the triage vital signs and the nursing notes.   Differential diagnosis includes, but is not limited to, fracture left forearm, contusion, dislocation elbow, head injury, contusion scalp, cervical strain, fall at home.  66 year old male presents to the ED via EMS after a fall that occurred at home due to his wet deck.  Patient was able to crawl back into the home and called family member and then also called EMS.  Patient has continued to be alert and talkative  while in the ED.  CT scan head and cervical spine are reassuring as patient is on Eliquis per his records.  Patient was also made aware that he does not have a fracture of his left forearm which was reassuring as he was worried about his previous surgery.  Lab work was essentially the same as it has been on outpatient basis with BUN 54 and creatinine 2.6.  It is noted that at his PCPs office creatinine has been higher due to his chronic kidney disease but generally in the 2.4-2.6 area.  Also has a history of anemia secondary to his chronic kidney disease and hemoglobin was 10.7 and hematocrit 33.5.  Patient was able to call a friend for transportation back home and requested that he also stop by for groceries prior to going home.       Patient's presentation is most consistent with acute presentation with potential threat to life or bodily function.  FINAL CLINICAL IMPRESSION(S) / ED DIAGNOSES   Final diagnoses:  Contusion of left forearm, initial encounter  Contusion of scalp, initial encounter  Fall in home, initial encounter     Rx / DC Orders   ED Discharge Orders     None        Note:  This document was prepared using Dragon voice recognition software and may include unintentional dictation errors.   Johnn Hai, PA-C 05/04/22 1414    Nathaniel Man, MD 05/04/22 1547

## 2022-05-04 NOTE — ED Triage Notes (Signed)
Pt to ED from home, [facility] for mechanical fall on slippery deck and pain to L forearm 6/10 and no obvious deformities noted by EMS. Hx surgery to same arm.  EMS VS: cbg 100, hx DM Pulse 95 Spo2 09% RA 160/100, hx HTN  Pt also states that after he fell he became dizzy and nauseous and his eyes became "glassy".

## 2022-05-04 NOTE — Discharge Instructions (Signed)
Follow with your primary care provider if any continued problems or concerns. You may be sore for the next 4 to 5 days from your fall but no fractures or head injury was noted on your CT scan and also your left forearm did not show any changes since the surgery that was done at [facility]. Apply ice to your forearm as needed for pain.  Also elevate if needed for swelling.

## 2022-05-04 NOTE — Telephone Encounter (Signed)
Patient called this morning stating that he slipped/ fell on his front porch (slippery due to rain) possible injury to left arm. Stated his has a metal plate in arm.He was advised to go to the urgent care or er for evaluation of arm pain feels like a toothache. tat

## 2022-05-08 ENCOUNTER — Encounter: Payer: Self-pay | Admitting: Nurse Practitioner

## 2022-05-09 IMAGING — MR MR MRA HEAD W/O CM
1 series · 19 of 48 positions shown · non-contrast
Comparison: Brain MRI today reported separately.

CLINICAL DATA: 65-year-old male code stroke presentation. Right
side numbness.

EXAM:
MRA HEAD WITHOUT CONTRAST
TECHNIQUE: Angiographic images of the Circle of Willis were obtained using MRA
technique without intravenous contrast.

[Series 1: TOF · axial · 0.5mm · 0.41mm/px · z∈[-16,+75]mm · 19 of 205 slices shown]
[im 1/205]
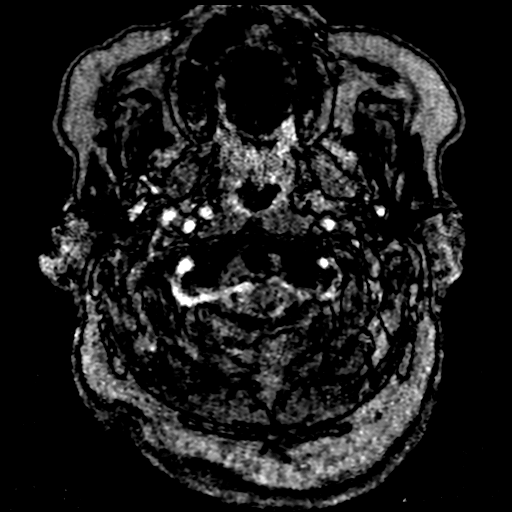
[im 5/205]
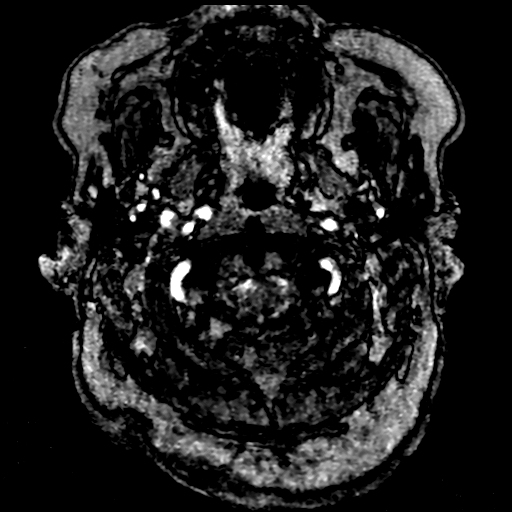
[im 9/205]
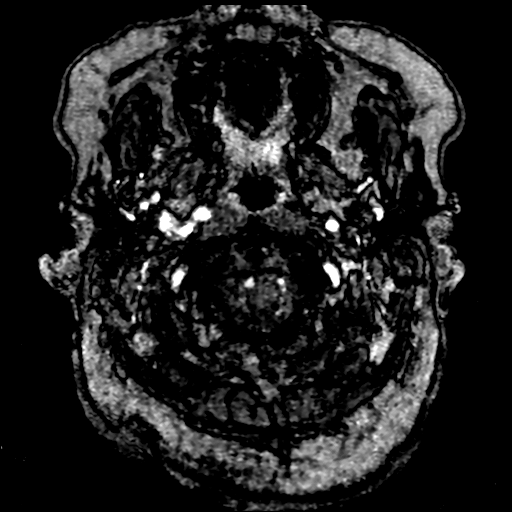
[im 14/205]
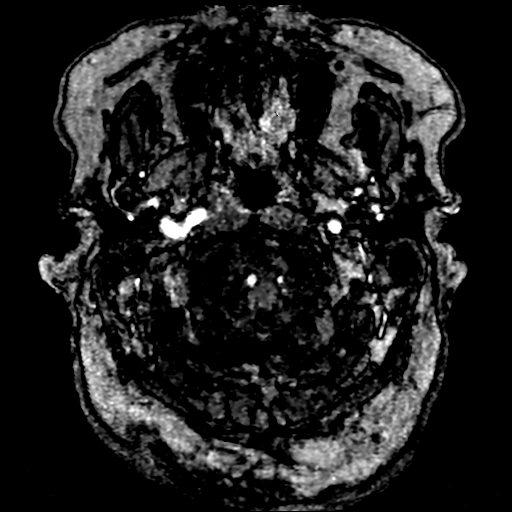
[im 18/205]
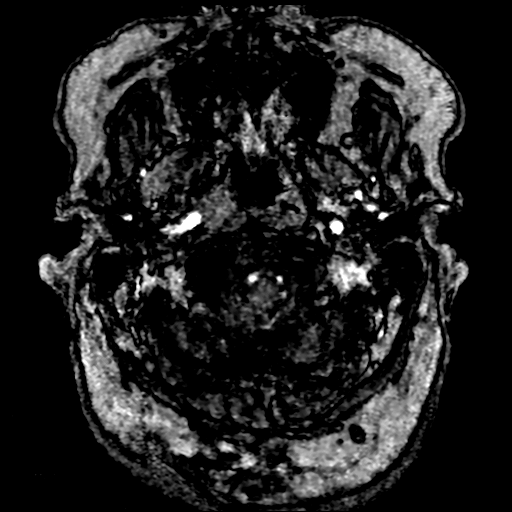
[im 22/205]
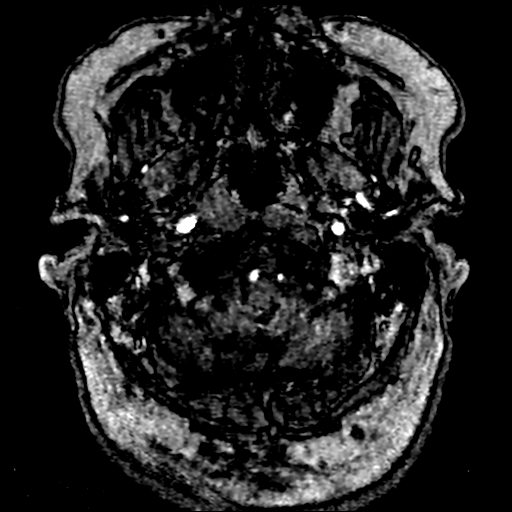
[im 27/205]
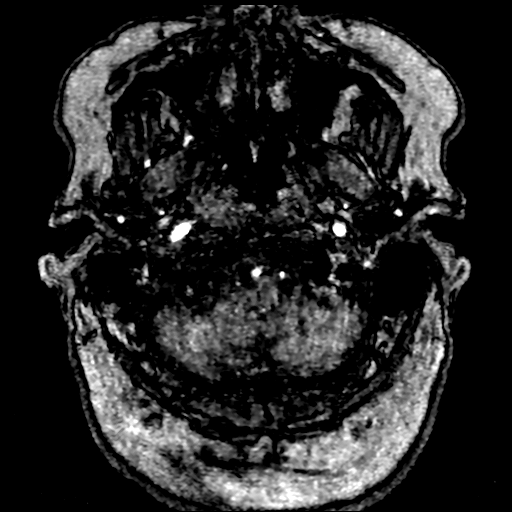
[im 31/205]
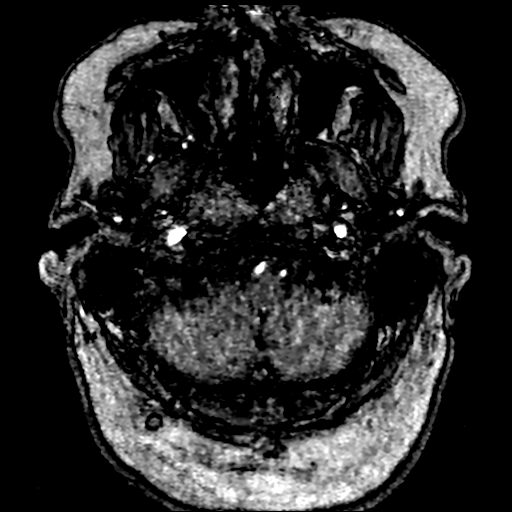
[im 35/205]
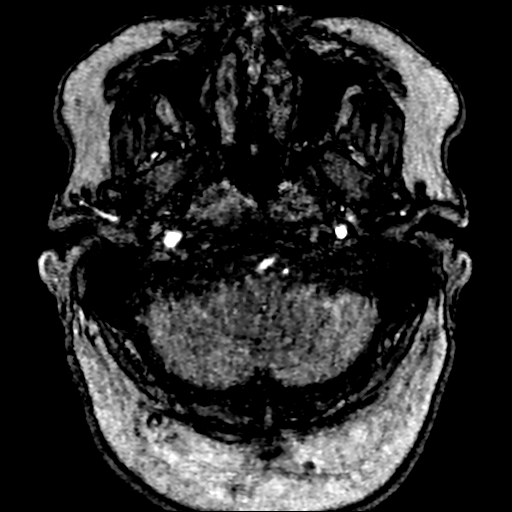
[im 40/205]
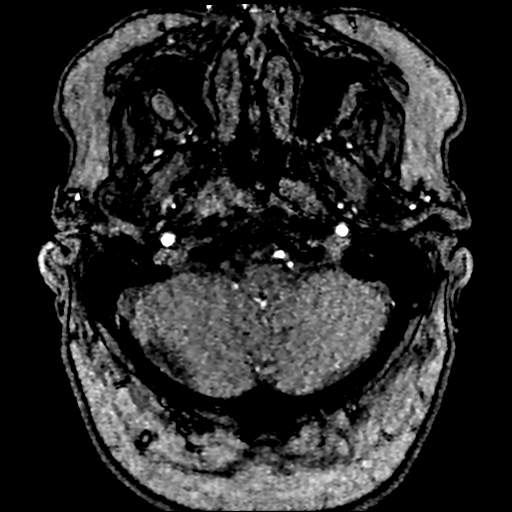
[im 44/205]
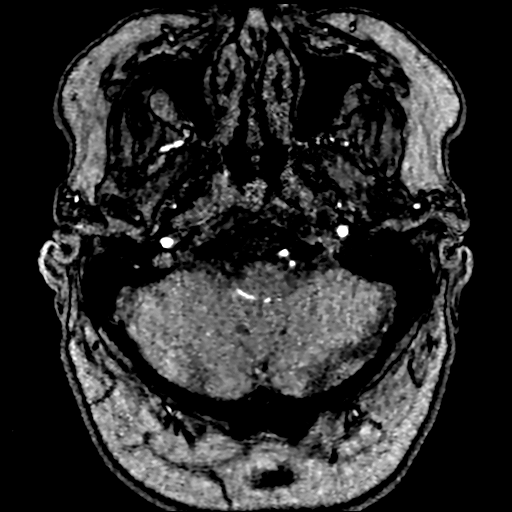
[im 66/205]
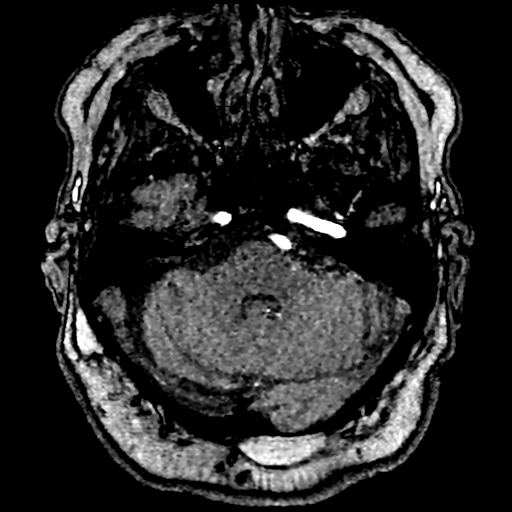
[im 92/205]
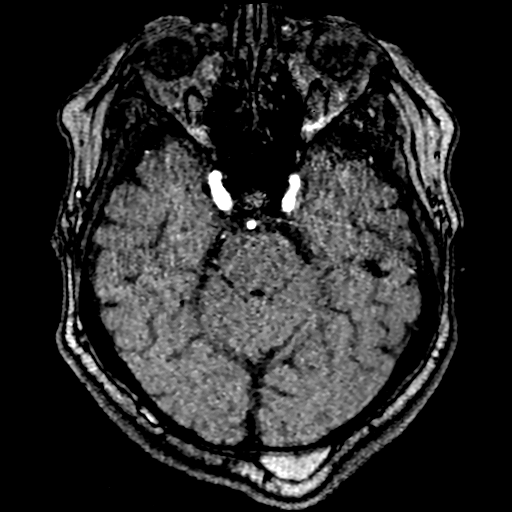
[im 105/205]
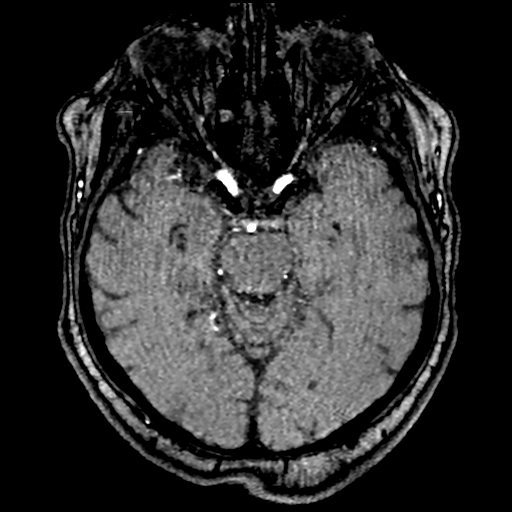
[im 118/205]
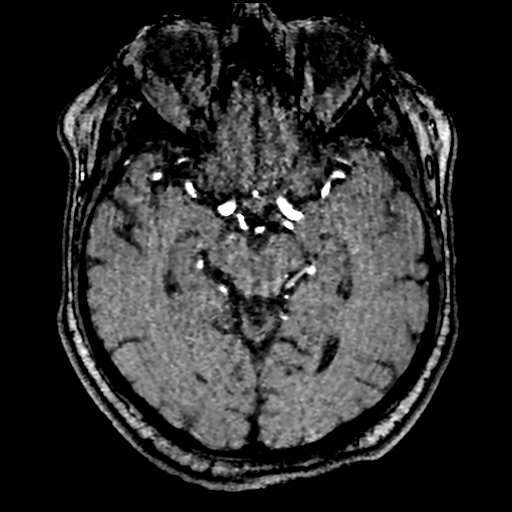
[im 144/205]
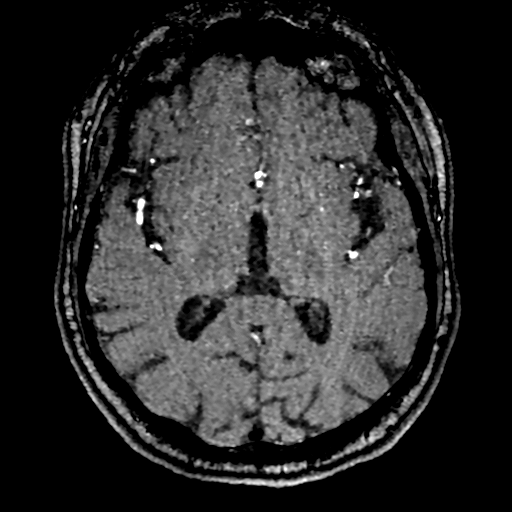
[im 170/205]
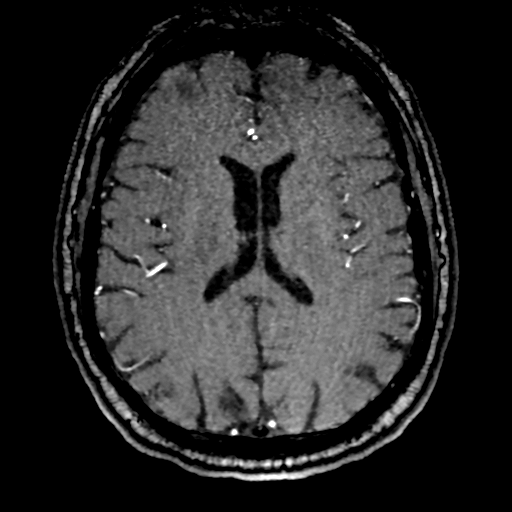
[im 174/205]
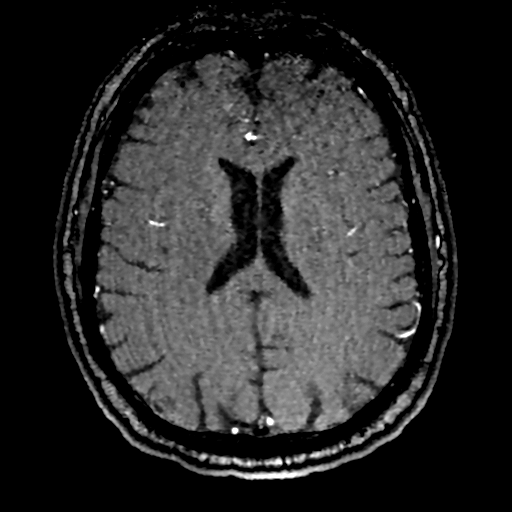
[im 196/205]
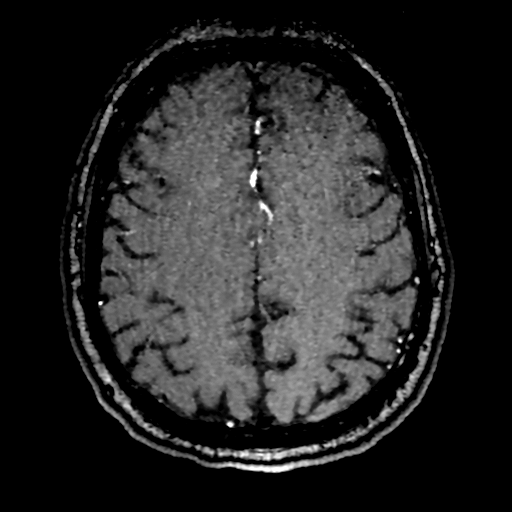

[19 of 48 positions shown; findings below may reference images not displayed]

FINDINGS: Antegrade flow in the posterior circulation. Mildly dominant distal
right vertebral artery. Mild motion artifact in the V4 segments. No
distal vertebral stenosis suspected. Normal vertebrobasilar
junction. Patent basilar artery without stenosis. Normal SCA and PCA
origins. Tortuous P1 segment. Bilateral PCA branches are within
normal limits.

Tortuous cervical right ICA just below the skull base. Antegrade
flow in both ICA siphons without stenosis. Normal ophthalmic artery
origins. Right posterior communicating artery is present with a
normal origin. The left is diminutive or absent. Patent carotid
termini. Normal MCA and ACA origins. Mildly dominant right A1
segment. Diminutive or absent anterior communicating artery.
Bilateral ACA branches are within normal limits. Tortuous bilateral
MCA M1 segments. Infundibulum of the left lenticulostriate artery
origin (normal variant). Visible bilateral MCA branches are within
normal limits.
IMPRESSION: Negative intracranial MRA.

## 2022-05-09 IMAGING — US US CAROTID DUPLEX BILAT
1 series · 14 of 24 positions shown · non-contrast
Comparison: None.

CLINICAL DATA: Stroke

Hypertension
Hyperlipidemia
Diabetes
EXAM:
BILATERAL CAROTID DUPLEX ULTRASOUND
TECHNIQUE: Gray scale imaging, color Doppler and duplex ultrasound were
performed of bilateral carotid and vertebral arteries in the neck.

[Series 1: us carotid bilateral · 14 of 62 slices shown]
[im 1/62]
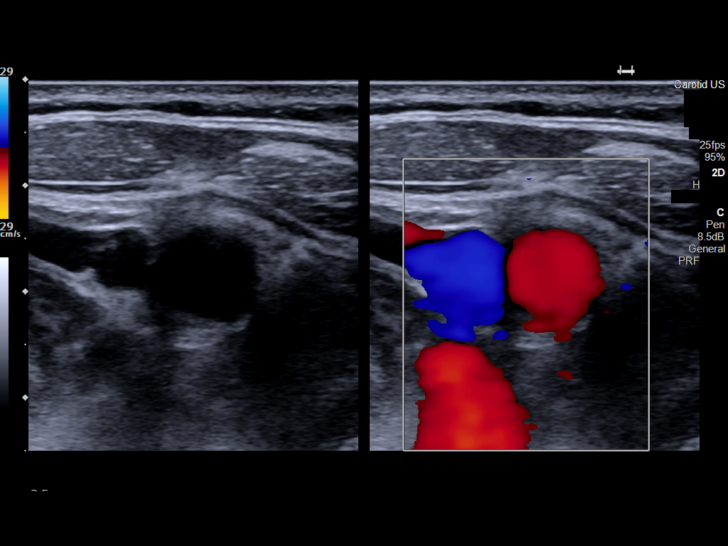
[im 6/62]
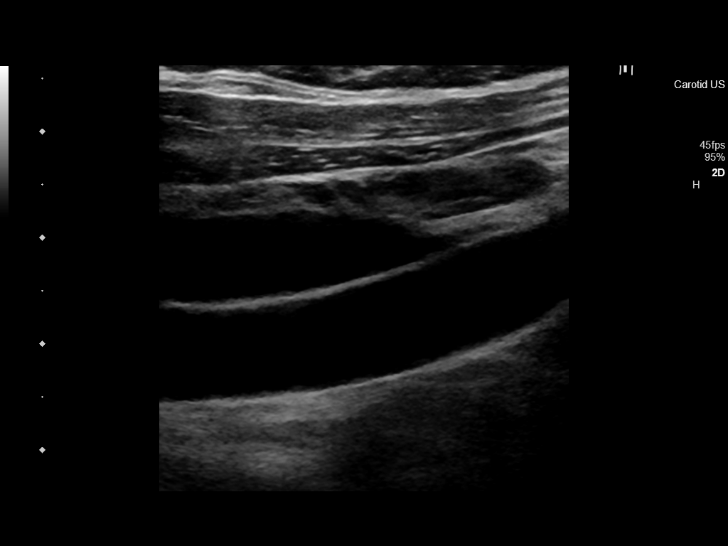
[im 11/62]
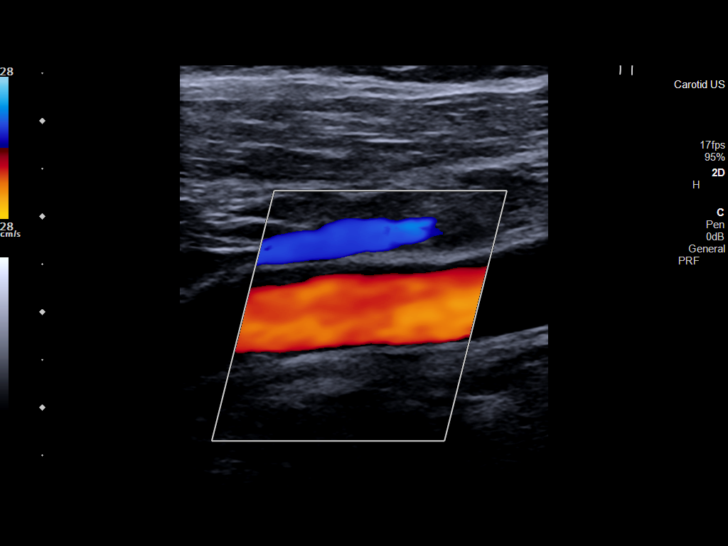
[im 16/62]
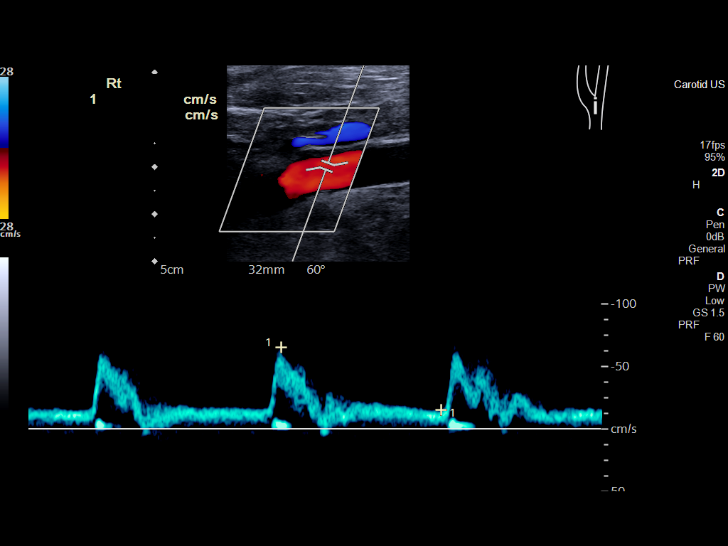
[im 19/62]
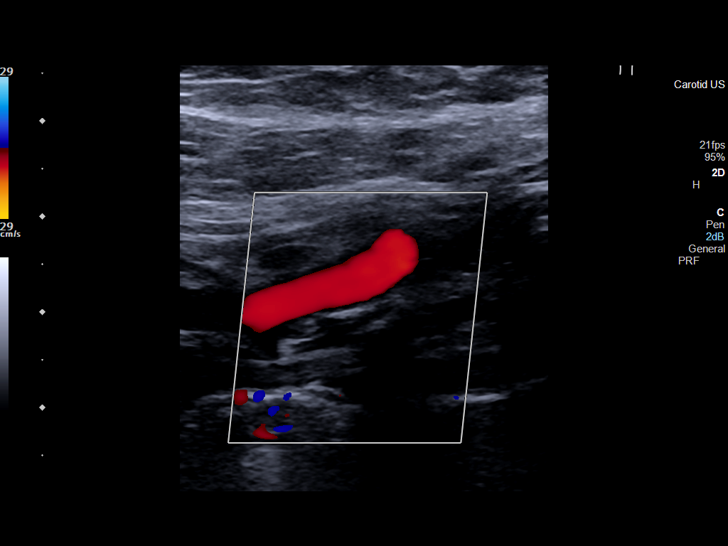
[im 24/62]
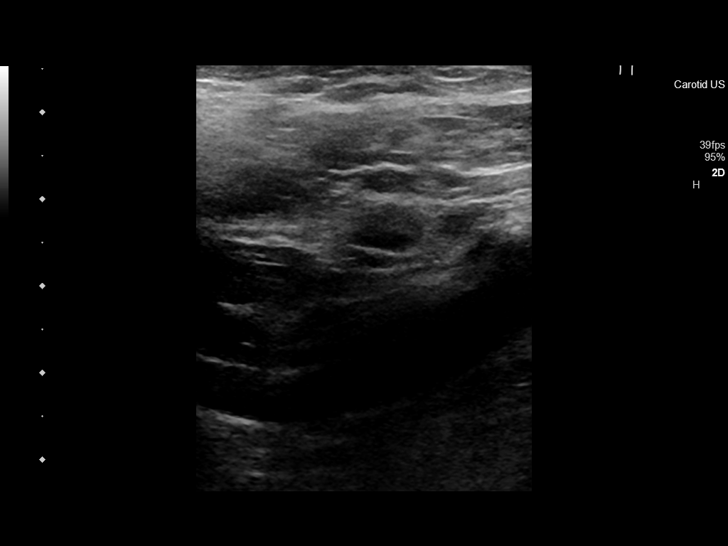
[im 30/62]
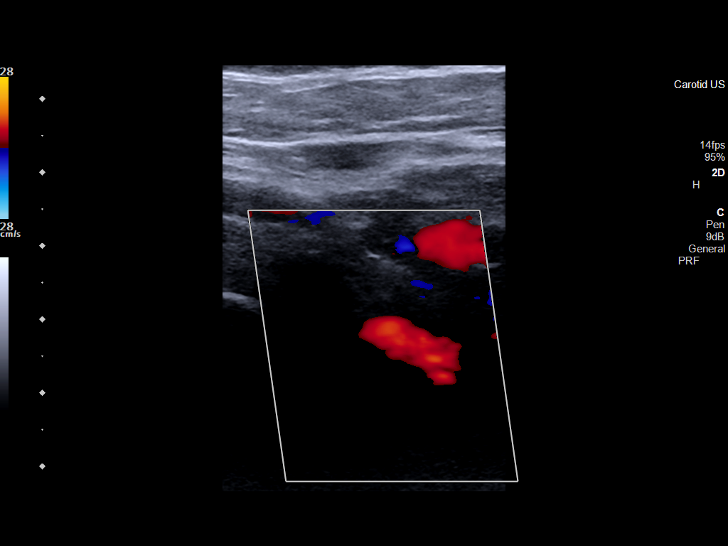
[im 32/62]
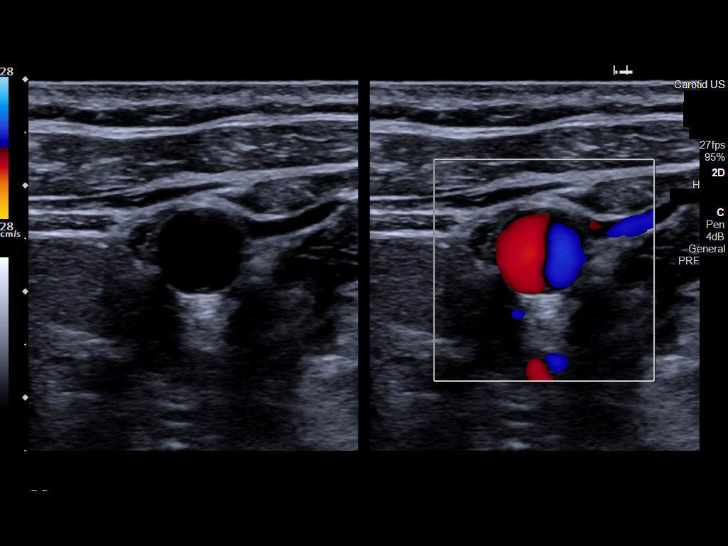
[im 38/62]
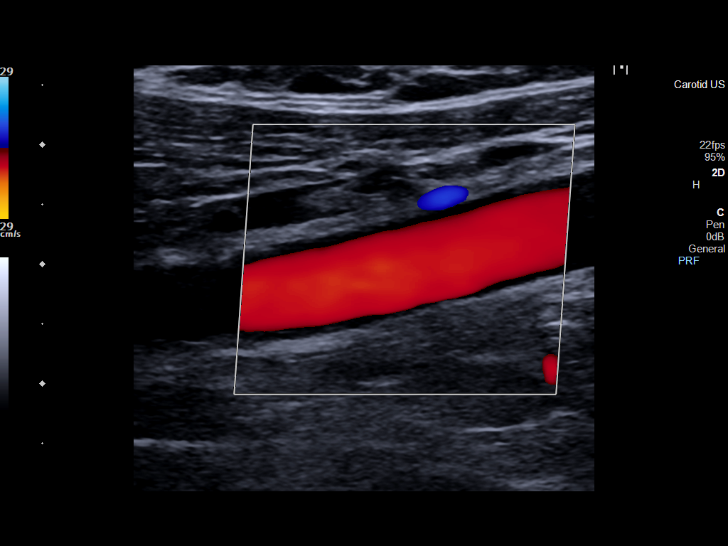
[im 43/62]
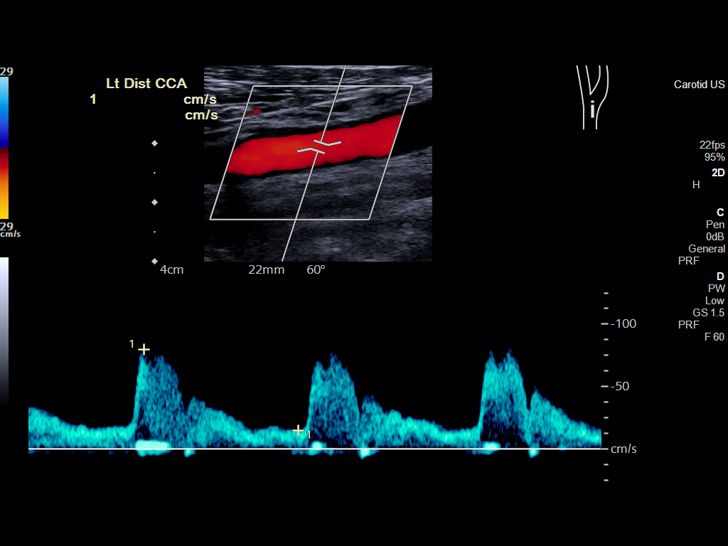
[im 48/62]
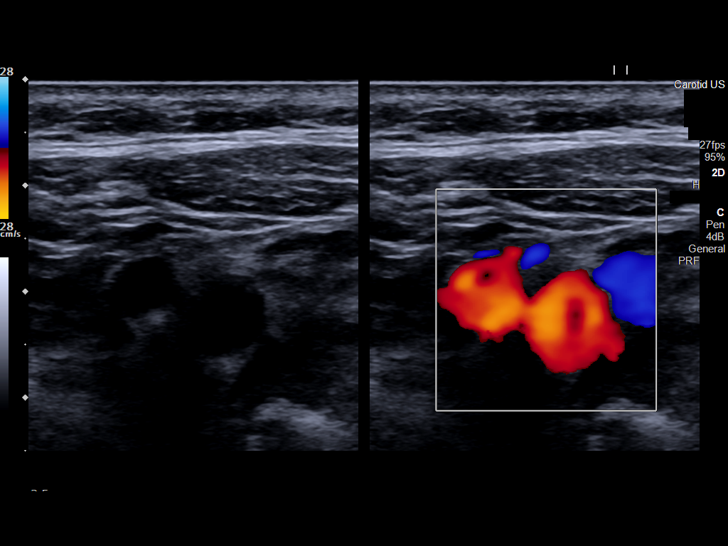
[im 51/62]
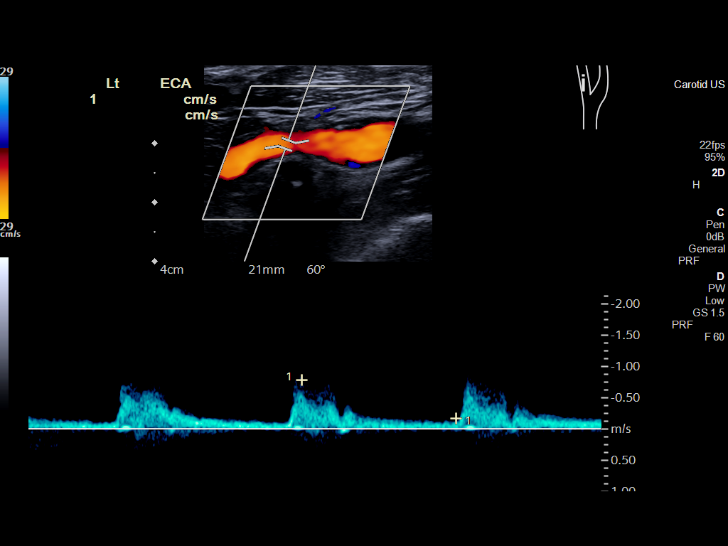
[im 56/62]
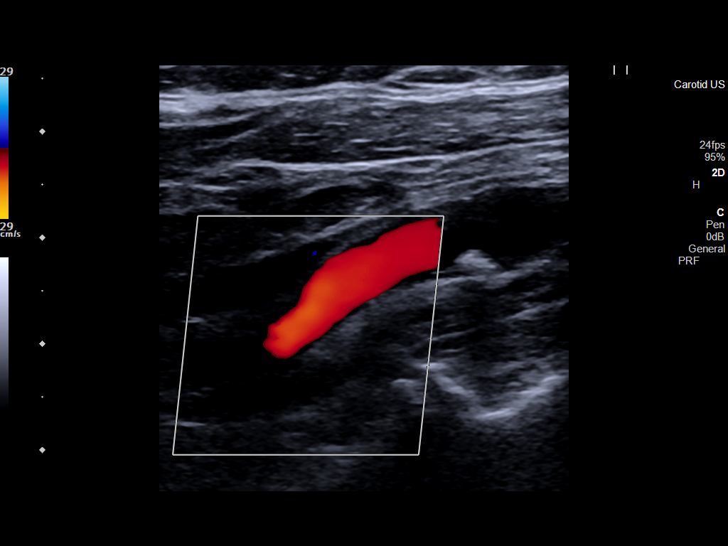
[im 62/62]
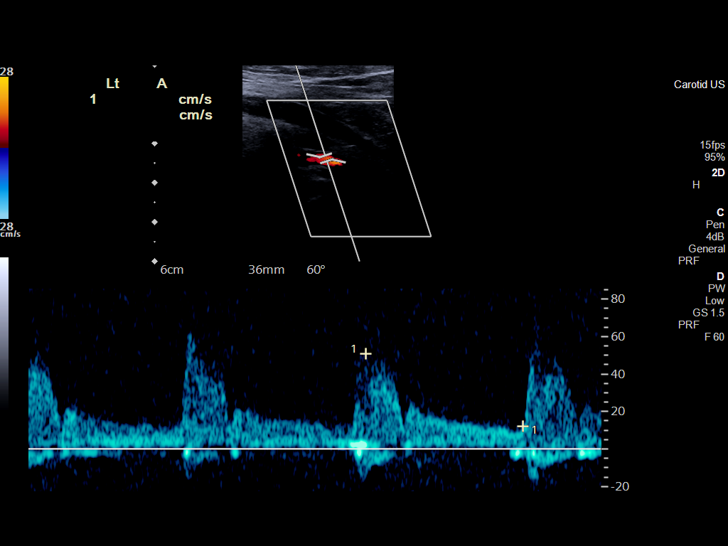

[14 of 24 positions shown; findings below may reference images not displayed]

FINDINGS: Criteria: Quantification of carotid stenosis is based on velocity
parameters that correlate the residual internal carotid diameter
with NASCET-based stenosis levels, using the diameter of the distal
internal carotid lumen as the denominator for stenosis measurement.

The following velocity measurements were obtained:

RIGHT

ICA: 95/28 cm/sec

CCA: 52/13 cm/sec

SYSTOLIC ICA/CCA RATIO:

ECA: 137 cm/sec

LEFT

ICA: 121/26 cm/sec

CCA: 80/15 cm/sec

SYSTOLIC ICA/CCA RATIO:

ECA: 79 cm/sec

RIGHT CAROTID ARTERY: No significant atheromatous plaque.

RIGHT VERTEBRAL ARTERY:  Antegrade flow.

LEFT CAROTID ARTERY: Minimal atheromatous plaque of the left
internal carotid artery origin without flow-limiting stenosis.

LEFT VERTEBRAL ARTERY:  Antegrade flow.
IMPRESSION: No significant stenosis of internal carotid arteries.

## 2022-05-16 ENCOUNTER — Emergency Department
Admission: EM | Admit: 2022-05-16 | Discharge: 2022-05-16 | Disposition: A | Discharge status: Home / Self Care | Payer: Medicare PPO | Attending: Emergency Medicine | Admitting: Emergency Medicine

## 2022-05-16 ENCOUNTER — Other Ambulatory Visit: Payer: Self-pay

## 2022-05-16 ENCOUNTER — Telehealth: Payer: Self-pay | Admitting: Nurse Practitioner

## 2022-05-16 ENCOUNTER — Telehealth: Payer: Medicare PPO | Admitting: Nurse Practitioner

## 2022-05-16 ENCOUNTER — Emergency Department: Payer: Medicare PPO

## 2022-05-16 DIAGNOSIS — R0789 Other chest pain: Secondary | ICD-10-CM | POA: Diagnosis not present

## 2022-05-16 DIAGNOSIS — R0602 Shortness of breath: Secondary | ICD-10-CM | POA: Diagnosis not present

## 2022-05-16 DIAGNOSIS — R519 Headache, unspecified: Secondary | ICD-10-CM | POA: Insufficient documentation

## 2022-05-16 DIAGNOSIS — I110 Hypertensive heart disease with heart failure: Secondary | ICD-10-CM | POA: Insufficient documentation

## 2022-05-16 DIAGNOSIS — J441 Chronic obstructive pulmonary disease with (acute) exacerbation: Secondary | ICD-10-CM | POA: Insufficient documentation

## 2022-05-16 DIAGNOSIS — I509 Heart failure, unspecified: Secondary | ICD-10-CM | POA: Diagnosis not present

## 2022-05-16 DIAGNOSIS — I700 Atherosclerosis of aorta: Secondary | ICD-10-CM | POA: Diagnosis not present

## 2022-05-16 DIAGNOSIS — R079 Chest pain, unspecified: Secondary | ICD-10-CM | POA: Diagnosis not present

## 2022-05-16 LAB — COMPREHENSIVE METABOLIC PANEL
ALT: 17 U/L (ref 0–44)
AST: 24 U/L (ref 15–41)
Albumin: 3.1 g/dL — ABNORMAL LOW (ref 3.5–5.0)
Alkaline Phosphatase: 71 U/L (ref 38–126)
Anion gap: 7 (ref 5–15)
BUN: 57 mg/dL — ABNORMAL HIGH (ref 8–23)
CO2: 21 mmol/L — ABNORMAL LOW (ref 22–32)
Calcium: 9 mg/dL (ref 8.9–10.3)
Chloride: 108 mmol/L (ref 98–111)
Creatinine, Ser: 2.61 mg/dL — ABNORMAL HIGH (ref 0.61–1.24)
GFR, Estimated: 26 mL/min — ABNORMAL LOW (ref >60)
Glucose, Bld: 157 mg/dL — ABNORMAL HIGH (ref 70–99)
Potassium: 4 mmol/L (ref 3.5–5.1)
Sodium: 136 mmol/L (ref 135–145)
Total Bilirubin: 0.9 mg/dL (ref 0.3–1.2)
Total Protein: 7.4 g/dL (ref 6.5–8.1)

## 2022-05-16 LAB — URINALYSIS, ROUTINE W REFLEX MICROSCOPIC
Bilirubin Urine: NEGATIVE
Glucose, UA: NEGATIVE mg/dL
Ketones, ur: NEGATIVE mg/dL
Leukocytes,Ua: NEGATIVE
Nitrite: NEGATIVE
Protein, ur: 100 mg/dL — AB
Specific Gravity, Urine: 1.009 (ref 1.005–1.030)
Squamous Epithelial / HPF: NONE SEEN (ref 0–5)
pH: 5 (ref 5.0–8.0)

## 2022-05-16 LAB — CBC
HCT: 35.3 % — ABNORMAL LOW (ref 39.0–52.0)
Hemoglobin: 11.2 g/dL — ABNORMAL LOW (ref 13.0–17.0)
MCH: 27.4 pg (ref 26.0–34.0)
MCHC: 31.7 g/dL (ref 30.0–36.0)
MCV: 86.3 fL (ref 80.0–100.0)
Platelets: 210 10*3/uL (ref 150–400)
RBC: 4.09 MIL/uL — ABNORMAL LOW (ref 4.22–5.81)
RDW: 13.7 % (ref 11.5–15.5)
WBC: 5 10*3/uL (ref 4.0–10.5)
nRBC: 0 % (ref 0.0–0.2)

## 2022-05-16 LAB — TROPONIN I (HIGH SENSITIVITY)
Troponin I (High Sensitivity): 9 ng/L (ref <18)
Troponin I (High Sensitivity): 11 ng/L (ref <18)

## 2022-05-16 LAB — PROTIME-INR
INR: 1.6 — ABNORMAL HIGH (ref 0.8–1.2)
Prothrombin Time: 19 seconds — ABNORMAL HIGH (ref 11.4–15.2)

## 2022-05-16 LAB — CBG MONITORING, ED: Glucose-Capillary: 83 mg/dL (ref 70–99)

## 2022-05-16 LAB — APTT: aPTT: 62 seconds — ABNORMAL HIGH (ref 24–36)

## 2022-05-16 LAB — BRAIN NATRIURETIC PEPTIDE: B Natriuretic Peptide: 77.7 pg/mL (ref 0.0–100.0)

## 2022-05-16 MED ORDER — LIDOCAINE 5 % EX PTCH: 1.0000 | MEDICATED_PATCH | Freq: Two times a day (BID) | CUTANEOUS | 0 refills | Status: AC

## 2022-05-16 MED ORDER — DOXYCYCLINE HYCLATE 100 MG PO TABS
100.0000 mg | ORAL_TABLET | Freq: Once | ORAL | Status: AC
  Administered 2022-05-16: 100 mg via ORAL
  Filled 2022-05-16: qty 1

## 2022-05-16 MED ORDER — IPRATROPIUM-ALBUTEROL 0.5-2.5 (3) MG/3ML IN SOLN
3.0000 mL | Freq: Once | RESPIRATORY_TRACT | Status: AC
  Administered 2022-05-16: 3 mL via RESPIRATORY_TRACT
  Filled 2022-05-16: qty 3

## 2022-05-16 MED ORDER — PREDNISONE 20 MG PO TABS
40.0000 mg | ORAL_TABLET | Freq: Once | ORAL | Status: AC
  Administered 2022-05-16: 40 mg via ORAL
  Filled 2022-05-16: qty 2

## 2022-05-16 MED ORDER — LIDOCAINE 5 % EX PTCH
2.0000 | MEDICATED_PATCH | CUTANEOUS | Status: DC
  Administered 2022-05-16: 2 via TRANSDERMAL
  Filled 2022-05-16: qty 2

## 2022-05-16 MED ORDER — CARVEDILOL 6.25 MG PO TABS
12.5000 mg | ORAL_TABLET | Freq: Once | ORAL | Status: AC
  Administered 2022-05-16: 12.5 mg via ORAL
  Filled 2022-05-16: qty 2

## 2022-05-16 MED ORDER — HYDROCODONE-ACETAMINOPHEN 5-325 MG PO TABS
2.0000 | ORAL_TABLET | Freq: Once | ORAL | Status: AC
  Administered 2022-05-16: 2 via ORAL
  Filled 2022-05-16: qty 2

## 2022-05-16 MED ORDER — PREDNISONE 20 MG PO TABS: 40.0000 mg | ORAL_TABLET | Freq: Every day | ORAL | 0 refills | Status: AC

## 2022-05-16 MED ORDER — HYDRALAZINE HCL 50 MG PO TABS
100.0000 mg | ORAL_TABLET | Freq: Once | ORAL | Status: AC
  Administered 2022-05-16: 100 mg via ORAL
  Filled 2022-05-16: qty 2

## 2022-05-16 MED ORDER — HYDROCODONE-ACETAMINOPHEN 5-325 MG PO TABS: 1.0000 | ORAL_TABLET | Freq: Four times a day (QID) | ORAL | 0 refills | Status: DC | PRN

## 2022-05-16 MED ORDER — DOXYCYCLINE HYCLATE 100 MG PO CAPS: 100.0000 mg | ORAL_CAPSULE | Freq: Two times a day (BID) | ORAL | 0 refills | Status: AC

## 2022-05-16 MED ORDER — ONDANSETRON 4 MG PO TBDP
4.0000 mg | ORAL_TABLET | Freq: Once | ORAL | Status: AC
  Administered 2022-05-16: 4 mg via ORAL
  Filled 2022-05-16: qty 1

## 2022-05-16 NOTE — ED Triage Notes (Addendum)
Pt in from home due to L side/flank pain. 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 Pain gets worse with deep breathing. COPD and HF history. States taking breathing tx at home. Reports wheezing upon exertion while running errands today. Pt's resp reg/unlabored, calm and skin dry currently.

## 2022-05-16 NOTE — ED Provider Notes (Signed)
[facility] Provider Note    Event Date/Time   First MD Initiated Contact with Patient 05/16/22 1839     (approximate)   History   No chief complaint on file.   HPI  Larry Callahan is a 66 y.o. male   with extensive past medical history including COPD, hypertension, CHF, peripheral artery disease, here with chest pain, left flank pain. Pt is somewhat of a difficult historian but reports that he has had worsening cough, SOB, and wheezing. He has also had increasing cough and has had sputum production (yellow-green). He has been having increasing sharp, right and left upper chest pain as well as L lower chest wall/flank pain worse with coughing. 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 Pain is worse with movement, deep inspiration and coughing. He has h/o afib and has been on his eliquis. Denies any leg swelling.      Physical Exam   Triage Vital Signs: ED Triage Vitals  Enc Vitals Group     BP 05/16/22 1348 (!) 170/110     Pulse Rate 05/16/22 1348 95     Resp 05/16/22 1348 19     Temp 05/16/22 1348 97.8 F (36.6 C)     Temp Source 05/16/22 1348 Oral     SpO2 05/16/22 1348 99 %     Weight 05/16/22 1351 229 lb (103.9 kg)     Height 05/16/22 1351 '5\' 11"'$  (1.803 m)     Head Circumference --      Peak Flow --      Pain Score 05/16/22 1349 7     Pain Loc --      Pain Edu? --      Excl. in [place]? --     Most recent vital signs: Vitals:   05/16/22 2229 05/16/22 2300  BP:  133/87  Pulse:  78  Resp:  20  Temp: 97.8 F (36.6 C) 97.8 F (36.6 C)  SpO2:  97%     General: Awake, no distress.  CV:  Good peripheral perfusion. Tachycardic, irregular. Resp:  Tachypnea with diminished aeration and wheezes. Abd:  No distention. No tenderness. No LUQ pain. No guarding or rebound. Other:  No chest wall lesions or TTP. Mild edema bl LE.   ED Results / Procedures / Treatments   Labs (all labs  ordered are listed, but only abnormal results are displayed) Labs Reviewed  CBC - Abnormal; Notable for the following components:      Result Value   RBC 4.09 (*)    Hemoglobin 11.2 (*)    HCT 35.3 (*)    All other components within normal limits  COMPREHENSIVE METABOLIC PANEL - Abnormal; Notable for the following components:   CO2 21 (*)    Glucose, Bld 157 (*)    BUN 57 (*)    Creatinine, Ser 2.61 (*)    Albumin 3.1 (*)    GFR, Estimated 26 (*)    All other components within normal limits  PROTIME-INR - Abnormal; Notable for the following components:   Prothrombin Time 19.0 (*)    INR 1.6 (*)    All other components within normal limits  APTT - Abnormal; Notable for the following components:   aPTT 62 (*)    All other components within normal limits  URINALYSIS, ROUTINE W REFLEX MICROSCOPIC - Abnormal; Notable for the following components:   Color, Urine YELLOW (*)  APPearance CLEAR (*)    Hgb urine dipstick SMALL (*)    Protein, ur 100 (*)    Bacteria, UA RARE (*)    All other components within normal limits  BRAIN NATRIURETIC PEPTIDE  CBG MONITORING, ED  TROPONIN I (HIGH SENSITIVITY)  TROPONIN I (HIGH SENSITIVITY)     EKG Atrial flutter with variable block QRS 90, QTc 432. No acute St elevations. TWI in inferior leads. No acute ischemic changes.   RADIOLOGY CXR: Clear CT Chest WO: No acute findings CT Head: Normal   I also independently reviewed and agree with radiologist interpretations.   PROCEDURES:  Critical Care performed: No  .1-3 Lead EKG Interpretation  Performed by: Duffy Bruce, MD Authorized by: Duffy Bruce, MD     Interpretation: abnormal     ECG rate:  80-120   ECG rate assessment: tachycardic     Rhythm: atrial flutter     Ectopy: none     Conduction: normal   Comments:     Indication: Chest pain     MEDICATIONS ORDERED IN ED: Medications  lidocaine (LIDODERM) 5 % 2 patch (2 patches Transdermal Patch Applied 05/16/22  2309)  carvedilol (COREG) tablet 12.5 mg (12.5 mg Oral Given 05/16/22 1936)  hydrALAZINE (APRESOLINE) tablet 100 mg (100 mg Oral Given 05/16/22 1936)  HYDROcodone-acetaminophen (NORCO/VICODIN) 5-325 MG per tablet 2 tablet (2 tablets Oral Given 05/16/22 2021)  ondansetron (ZOFRAN-ODT) disintegrating tablet 4 mg (4 mg Oral Given 05/16/22 2021)  ipratropium-albuterol (DUONEB) 0.5-2.5 (3) MG/3ML nebulizer solution 3 mL (3 mLs Nebulization Given 05/16/22 2050)  ipratropium-albuterol (DUONEB) 0.5-2.5 (3) MG/3ML nebulizer solution 3 mL (3 mLs Nebulization Given 05/16/22 2230)  ipratropium-albuterol (DUONEB) 0.5-2.5 (3) MG/3ML nebulizer solution 3 mL (3 mLs Nebulization Given 05/16/22 2230)  doxycycline (VIBRA-TABS) tablet 100 mg (100 mg Oral Given 05/16/22 2308)  predniSONE (DELTASONE) tablet 40 mg (40 mg Oral Given 05/16/22 2309)     IMPRESSION / MDM / ASSESSMENT AND PLAN / ED COURSE  I reviewed the triage vital signs and the nursing notes.                              Differential diagnosis includes, but is not limited to, chest wall pain, PNA, PE, rib fx, unlikely radiculopathy, renal stone, ACS, dissection.  Patient's presentation is most consistent with acute presentation with potential threat to life or bodily function.  66 yo M with extensive PMHx including HTN, COPD, CHF, recurrent ED visits, here with multiple complaints - primarily bilateral upper chest/shoulder pain and left lower chest wall pain with coughing. Clinically, suspect mild COPD exacerbation with chest wall pain 2/2 coughing. EKG is nonischemic and trops neg x 2 - do not suspect ACS. CT obtained and shows no signs of PNA, rib fx, PTX, HTX, or other abnormality. Pt is on anticoagulation and is not hypoxic, do not suspect clinically significant PE. The flank pain is localized to his chest wall as well. BP markedly improved after his PO meds and he is ambulatory throughout ED without difficulty. Do not suspect dissection - pain resolved,  was not sharp or tearing, was positional w/ coughing and has no other history/exam findings to suggest this, as well as normal CT though non-contrast.  Will tx for COPD exacerbation, advise analgesia for his chest and side and d/c with follow-up.    FINAL CLINICAL IMPRESSION(S) / ED DIAGNOSES   Final diagnoses:  COPD exacerbation ([place])  Chest wall pain  Rx / DC Orders   ED Discharge Orders          Ordered    predniSONE (DELTASONE) 20 MG tablet  Daily        05/16/22 2259    doxycycline (VIBRAMYCIN) 100 MG capsule  2 times daily        05/16/22 2259    HYDROcodone-acetaminophen (NORCO/VICODIN) 5-325 MG tablet  Every 6 hours PRN        05/16/22 2259    lidocaine (LIDODERM) 5 %  Every 12 hours        05/16/22 2303             Note:  This document was prepared using Dragon voice recognition software and may include unintentional dictation errors.   Duffy Bruce, MD 05/17/22 [phone]

## 2022-05-16 NOTE — Discharge Instructions (Addendum)
For your chest pain:  You can place the lidoderm patches over the areas of pain to see if it helps. Take TYLENOL over-the-counter for mild to moderate pain. The steroids should help with pain.  Take the HYDROCODONE with food for SEVERE pain but this can make you drowsy, so be cautious.  Take the antibiotic to help with your lungs/breathing.  Your pain is probably related to coughing from your COPD exacerbation. As this improves it should also improve your pain.

## 2022-05-16 NOTE — ED Notes (Signed)
Pt reports that he fell a few weeks ago on his deck and has had pain on his left side that radiates up his side and over to his right shoulder for a couple of days. Pt reports when he takes a deep breath is when the pain occurs.

## 2022-05-16 NOTE — ED Notes (Signed)
Assumed care of patient at this time. Report received from [place], [place]

## 2022-05-16 NOTE — ED Notes (Signed)
Given sprite at this time.

## 2022-05-16 NOTE — ED Notes (Signed)
Walking pulse Ox 100% RA.Patient states he felt dizzy as if things were spinning.

## 2022-05-16 NOTE — ED Provider Triage Note (Signed)
Emergency Medicine Provider Triage Evaluation Note  Larry Callahan , a 66 y.o. male  was evaluated in triage.  Pt complains of left side/ flank pain.  Pain worse with deep breaths.  Wheezing at home and did use his breathing tx with some improvement.   Review of Systems  Positive: + COPD Negative: No fever, chills or cough  Physical Exam  BP (!) 170/110 (BP Location: Left Arm)   Pulse 95   Temp 97.8 F (36.6 C) (Oral)   Resp 19   Ht '5\' 11"'$  (1.803 m)   Wt 103.9 kg   SpO2 99%   BMI 31.94 kg/m  Gen:   Awake, no distress  Rapid constant talking without any SOB noted.  Resp:  Normal effort.  No wheezing noted on exam.  MSK:   Moves extremities without difficulty  Other:    Medical Decision Making  Medically screening exam initiated at 2:02 PM.  Appropriate orders placed.  Larry Callahan was informed that the remainder of the evaluation will be completed by another provider, this initial triage assessment does not replace that evaluation, and the importance of remaining in the ED until their evaluation is complete.     Johnn Hai, PA-C 05/16/22 1410

## 2022-05-16 NOTE — Telephone Encounter (Signed)
Patient called stated he has left-sided and bilateral shoulder pain. Per Ranee Gosselin, I scheduled patient for phone call visit @ 3:00 as a phone call visit since he stated he does not have transportation. When I told him it was a phone call visit, he asked how provider is supposed to determine what is wrong with him, and if x-ray is needed, that is too late in the evening to get one. I then, tried to schedule him as phone visit with Lauren @ 9:40. He stated he was just going to ED so they can do x-rays-[name]

## 2022-05-16 NOTE — ED Notes (Signed)
CBG 83

## 2022-05-16 NOTE — ED Notes (Signed)
Pt denies frequency or burning upon urination.

## 2022-05-19 IMAGING — DX DG CHEST 1V PORT
1 series · 1 of 1 positions shown · non-contrast
Comparison: September 18, 2020

CLINICAL DATA: Chest pain

EXAM:
PORTABLE CHEST 1 VIEW

[chest ap]
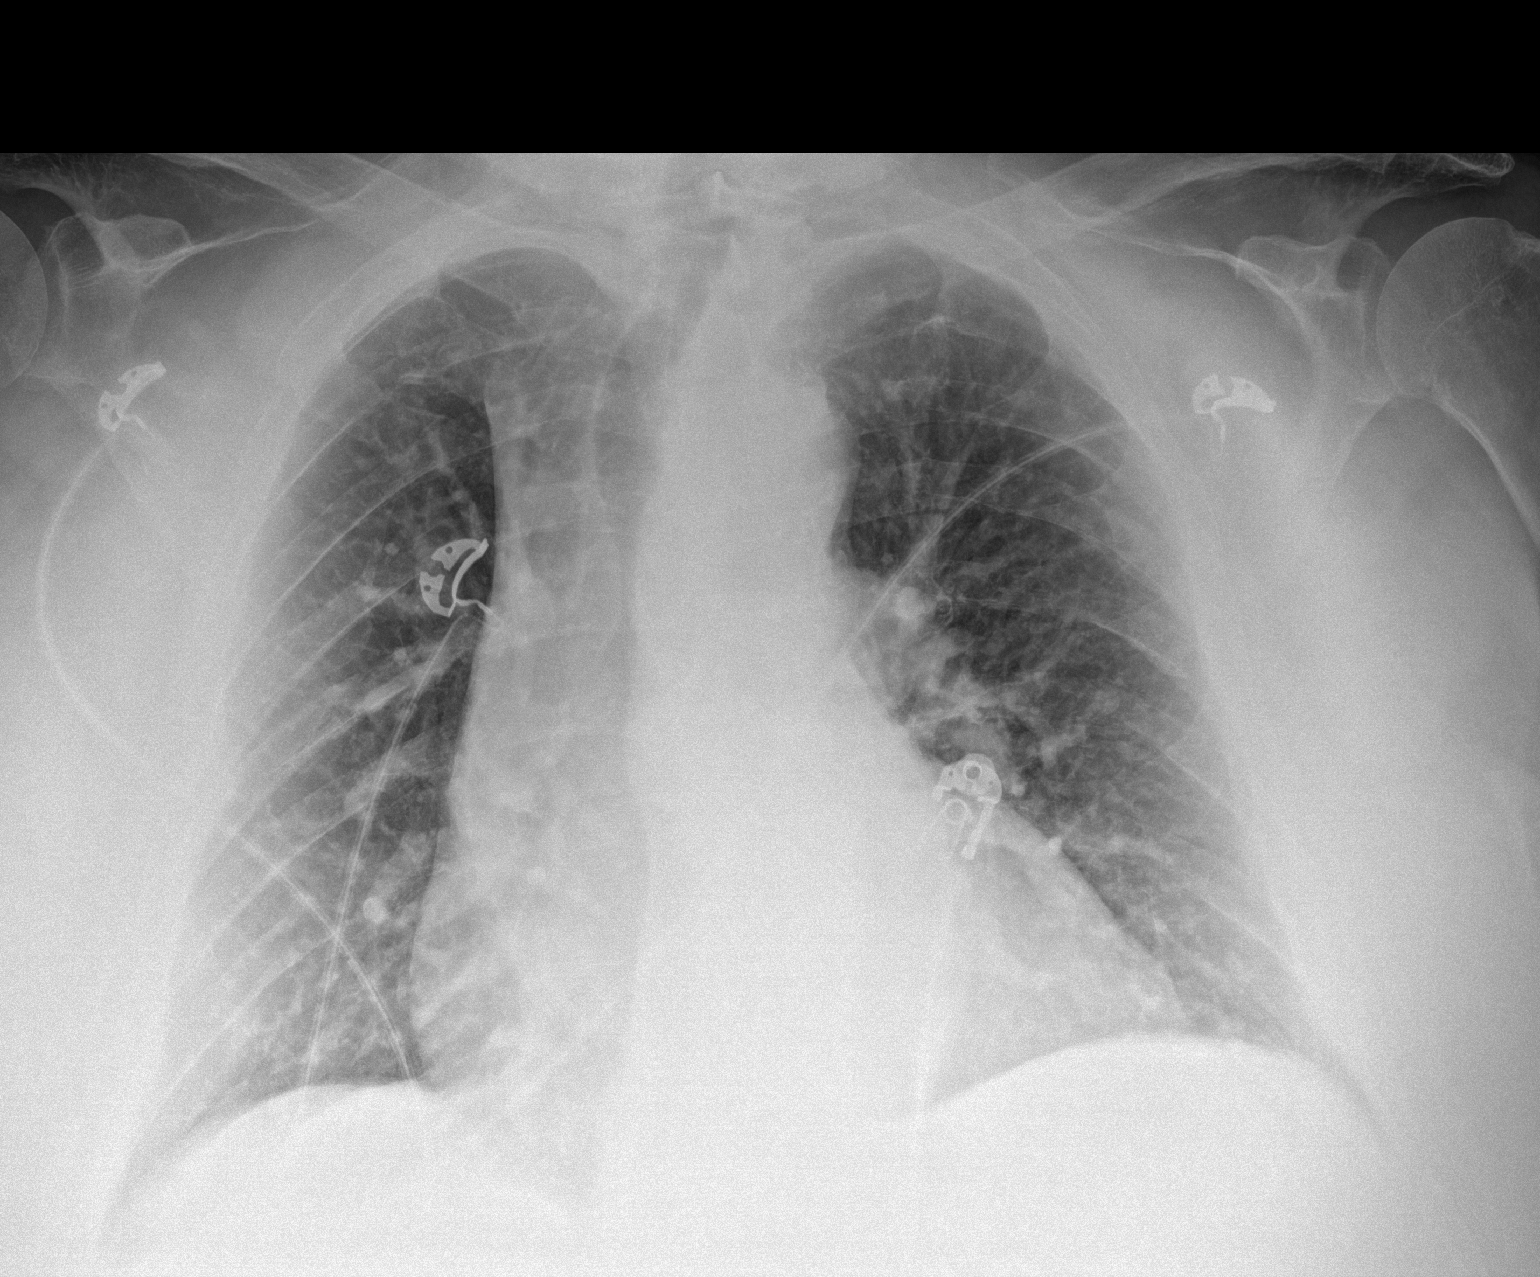

[1 of 1 positions shown; findings below may reference images not displayed]

FINDINGS: Interstitium is thickened without edema or consolidation evident.
Heart is borderline enlarged with pulmonary vascularity normal. No
adenopathy. No bone lesions.
IMPRESSION: Relative interstitial thickening which may indicate a degree of
underlying bronchitis. No edema or consolidation. Heart borderline
enlarged. No adenopathy appreciable.

## 2022-05-20 ENCOUNTER — Telehealth: Payer: Self-pay

## 2022-05-20 NOTE — Telephone Encounter (Signed)
Just FYI.

## 2022-05-22 NOTE — Progress Notes (Unsigned)
Patient ID: Larry Callahan, male    DOB: [birth year], 66 y.o.   MRN: [id number]  HPI  Larry Callahan is a 65 yo male with a PMHx of nonischemic cardiomyopathy last EF measured in April 2022 was 2 to 40%, COPD, sleep apnea, chronic kidney disease stage IV, chronic anemia, hypertension, and diabetes mellitus.  Echo from 08/08/21 reviewed and showed and EF of 40-45%. There is mild to moderately decreased function of the left ventricle. The diastolic parameters are indeterminate. The RV systolic function is low normal.   RHC done 03/29/2019 and showed: Pulmonary capillary wedge pressure mean of 8 PA: 32/16 mean 23 RA mean of 10 RV 26/12/13  Cardiac output 8.17 Cardiac index 3.42 Final Conclusions:   Normal right heart pressures  Was in the ED 05/16/22 due to COPD exacerbation. Was in the ED 05/04/22 due to contusion after a fall. Was in the ED 02/22/22 due to concerns he was having a stroke and this was ruled out and he was released. Was in the ED 01/20/22 due to atypical chest pain where he was evaluated and released. Admitted 01/09/22 due to  nausea vomiting and noticing bright red blood in his vomit.  He also reported some mild shortness of breath, generalized weakness dizziness. Palliative care and GI consults obtained. EGD done.   Discharged after 3 days. Admitted 01/03/22 due to abdominal pain. Was given morphine with subsequent dizziness. CT abdomen with umbilical hernia with no obstruction and concern of enlarging right renal complex cyst which need further investigation. Surgical consult obtained. Discharged the next day. Admitted 12/17/21 due to acute on chronic heart failure. Discharged after 3 days. Was in the ED 12/06/21 due to needing antibiotic refill as he lost previous prescription. Admitted 11/30/21 due to swelling and pain around the right eye. Ophthamology consult obtained. Started on IV antibiotics due to periorbital cellulitis and then transitioned to oral antibiotics. Discharged  after 4 days.   He presents for a follow-up visit with a chief complaint of minimal fatigue upon exertion. Describes this as chronic in nature. He has associated cough, shortness of breath, weight loss, leg weakness and anxiety along with this. He denies any difficulty sleeping, dizziness, abdominal distention, palpitations, pedal edema, chest pain, wheezing or weight gain.   Hasn't been checking his glucose at home because he says that "something is wrong with it". He hasn't picked up his antibiotic from the pharmacy yet from his recent ED visit.   Past Medical History:  Diagnosis Date   Allergy    Atrial flutter ([place])    a. Dx 12/2020--[id number] = 5-->eliquis.   Chronic combined systolic (congestive) and diastolic (congestive) heart failure ([place])    a. 04/2019 Echo: EF 45-50%; b. 02/2019 Echo: EF 55-60%; c. 09/2020 Echo: EF 35-40%, glob HK. Nl RV size/fxn. Mild MR; d. 07/2021 Echo: EF 40-45%, mild LVH, low-nl RV fxn, no MR.   CKD (chronic kidney disease), stage III (HCC)    COPD (chronic obstructive pulmonary disease) ([place])    Coronary artery disease    a. 2011 Cath: nonobs dzs; b. 09/2020 MV: EF 38%. No signif ischemia. ? inf MI vs diaph attenuation. GI uptake artifact.   Diabetes mellitus without complication (HCC)    GERD (gastroesophageal reflux disease)    History of CVA (cerebrovascular accident) 09/25/2020   Hyperlipidemia    Hypertension    Morbid (severe) obesity due to excess calories ([place]) 06/09/2017   Morbid obesity ([place])    NICM (nonischemic cardiomyopathy) ([place])  a. 04/2019 Echo: EF 45-50%; b. 02/2019 Echo: EF 55-60%; c. 09/2020 Echo: EF 35-40%, glob HK; d. 09/2020 MV: No ischemia. ? inf infarct vs attenuation; d. 07/2021 Echo: EF 40-45%.   OSA (obstructive sleep apnea)    Pneumonia due to COVID-19 virus 01/30/2020   Stage 3b chronic kidney disease ([place])    Syncope and collapse 11/16/2020   Past Surgical History:  Procedure Laterality Date   BACK SURGERY     CARDIAC  CATHETERIZATION     CHOLECYSTECTOMY     COLONOSCOPY WITH PROPOFOL N/A 04/12/2019   Procedure: COLONOSCOPY WITH PROPOFOL;  Surgeon: [name], MD;  Location: [facility] ENDOSCOPY;  Service: Gastroenterology;  Laterality: N/A;   ESOPHAGOGASTRODUODENOSCOPY N/A 04/12/2019   Procedure: ESOPHAGOGASTRODUODENOSCOPY (EGD);  Surgeon: [name], MD;  Location: [facility] ENDOSCOPY;  Service: Gastroenterology;  Laterality: N/A;   ESOPHAGOGASTRODUODENOSCOPY (EGD) WITH PROPOFOL N/A 11/21/2019   Procedure: ESOPHAGOGASTRODUODENOSCOPY (EGD) WITH PROPOFOL;  Surgeon: [name], MD;  Location: [facility] ENDOSCOPY;  Service: Gastroenterology;  Laterality: N/A;   ESOPHAGOGASTRODUODENOSCOPY (EGD) WITH PROPOFOL N/A 01/11/2022   Procedure: ESOPHAGOGASTRODUODENOSCOPY (EGD) WITH PROPOFOL;  Surgeon: [name], MD;  Location: [name] ENDOSCOPY;  Service: Endoscopy;  Laterality: N/A;   FLEXIBLE BRONCHOSCOPY Bilateral 05/17/2019   Procedure: FLEXIBLE BRONCHOSCOPY;  Surgeon: [name], MD;  Location: [name] ORS;  Service: Pulmonary;  Laterality: Bilateral;   left arm surgery     nephrectomy Left    PARATHYROIDECTOMY     RIGHT HEART CATH N/A 03/29/2019   Procedure: RIGHT HEART CATH;  Surgeon: [name], MD;  Location: [facility] LAB;  Service: Cardiovascular;  Laterality: N/A;   Family History  Problem Relation Age of Onset   Diabetes Mother        2003   Lung cancer Father    Diabetes Sister    Hypertension Sister    Heart disease Sister    Cancer Sister    Bone cancer Brother    Prostate cancer Neg Hx    Bladder Cancer Neg Hx    Kidney cancer Neg Hx    Social History   Tobacco Use   Smoking status: Former    Types: Cigarettes   Smokeless tobacco: Former    Types: Chew  Substance Use Topics   Alcohol use: No   Allergies  Allergen Reactions   Penicillins Rash   Prior to Admission medications   Medication Sig Start Date End Date Taking? Authorizing Provider  Accu-Chek Softclix  Lancets lancets Use to check blood sugars 4 times a day   E11.65 08/20/21  Yes Abernathy, Alyssa, NP  apixaban (ELIQUIS) 5 MG TABS tablet Take 1 tablet (5 mg total) by mouth 2 (two) times daily. Must make appt with Dr. Rockey Situ for refills 02/09/22  Yes Gollan, Kathlene November, MD  Blood Glucose Monitoring Suppl (TRUE METRIX AIR GLUCOSE METER) DEVI 1 Device by Does not apply route 2 (two) times daily. 04/13/18  Yes Boscia, Greer Ee, NP  Budeson-Glycopyrrol-Formoterol (BREZTRI AEROSPHERE) 160-9-4.8 MCG/ACT AERO Inhale 2 puffs into the lungs 2 (two) times daily. 03/29/22  Yes Abernathy, Yetta Flock, NP  carvedilol (COREG) 12.5 MG tablet Take 1 tablet (12.5 mg total) by mouth 2 (two) times daily. 02/25/22  Yes Darylene Price A, FNP  cyanocobalamin 1000 MCG tablet Take 1 tablet (1,000 mcg total) by mouth daily. 12/13/21  Yes Lavera Guise, MD  furosemide (LASIX) 40 MG tablet Take 1 tablet (40 mg total) by mouth daily. Take lasix 75m po daily 02/25/22  Yes [facility]  Aura Fey, FNP  glimepiride (AMARYL) 2 MG tablet Take 1 tablet (2 mg total) by mouth daily. 03/29/22  Yes Abernathy, Yetta Flock, NP  glucose blood (ACCU-CHEK GUIDE) test strip Use to check blood sugars 4 times a day   E11.65 12/13/21  Yes Lavera Guise, MD  hydrALAZINE (APRESOLINE) 100 MG tablet Take 100 mg by mouth 3 (three) times daily. 12/30/21  Yes [provider]  insulin glargine, 2 Unit Dial, (TOUJEO MAX SOLOSTAR) 300 UNIT/ML Solostar Pen Inject 20 units in am 03/03/22  Yes McDonough, Lauren K, PA-C  Insulin Pen Needle (NOVOFINE PEN NEEDLE) 32G X 6 MM MISC Use as directed with toujeo pen 03/29/22  Yes Abernathy, Alyssa, NP  ipratropium-albuterol (DUONEB) 0.5-2.5 (3) MG/3ML SOLN Take 3 mLs by nebulization every 6 (six) hours as needed. 06/29/21  Yes Danford, Suann Larry, MD  lidocaine (LIDODERM) 5 % Place 1 patch onto the skin every 12 (twelve) hours for 10 days. Remove & Discard patch within 12 hours or as directed by MD 05/16/22 05/26/22 Yes Duffy Bruce,  MD  omeprazole (PRILOSEC) 40 MG capsule Take 1 capsule once in the morning and once in the evening 12/13/21  Yes Lavera Guise, MD  potassium chloride (KLOR-CON) 20 MEQ packet Take 20 mEq by mouth daily.   Yes [provider]  rosuvastatin (CRESTOR) 10 MG tablet Take 1 tablet (10 mg total) by mouth at bedtime. 03/29/22  Yes Abernathy, Alyssa, NP  TOUJEO SOLOSTAR 300 UNIT/ML Solostar Pen Inject 26 Units into the skin daily. 03/29/22  Yes Lavera Guise, MD  doxycycline (VIBRAMYCIN) 100 MG capsule Take 1 capsule (100 mg total) by mouth 2 (two) times daily for 7 days. Patient not taking: Reported on 05/23/2022 05/16/22 05/23/22  Duffy Bruce, MD  HYDROcodone-acetaminophen (NORCO/VICODIN) 5-325 MG tablet Take 1 tablet by mouth every 6 (six) hours as needed for severe pain. Patient not taking: Reported on 05/23/2022 05/16/22 05/16/23  Duffy Bruce, MD   Review of Systems  Constitutional:  Positive for fatigue. Negative for appetite change and fever.  HENT:  Positive for hearing loss. Negative for congestion and sore throat.   Eyes: Negative.   Respiratory:  Positive for cough (productive) and shortness of breath. Negative for wheezing.   Cardiovascular:  Negative for chest pain, palpitations and leg swelling.  Gastrointestinal:  Negative for abdominal distention, abdominal pain, diarrhea and nausea.  Endocrine: Negative.   Genitourinary: Negative.   Musculoskeletal:  Negative for arthralgias, back pain and neck pain.  Skin: Negative.   Allergic/Immunologic: Negative.   Neurological:  Positive for weakness (in legs). Negative for dizziness and light-headedness.  Hematological:  Negative for adenopathy. Does not bruise/bleed easily.  Psychiatric/Behavioral:  Negative for dysphoric mood and sleep disturbance (sleeping on 1 pillow with CPAP). The patient is nervous/anxious.    Vitals:   05/23/22 0959  BP: 134/83  Pulse: 70  Resp: 18  SpO2: 100%  Weight: 229 lb 4 oz (104 kg)   Wt  Readings from Last 3 Encounters:  05/23/22 229 lb 4 oz (104 kg)  05/16/22 229 lb (103.9 kg)  05/04/22 250 lb (113.4 kg)   Lab Results  Component Value Date   CREATININE 2.61 (H) 05/16/2022   CREATININE 2.62 (H) 05/04/2022   CREATININE 2.43 (H) 02/22/2022   Physical Exam Vitals and nursing note reviewed.  Constitutional:      Appearance: Normal appearance.  HENT:     Head: Normocephalic and atraumatic.  Cardiovascular:     Rate and Rhythm: Normal rate  and regular rhythm.  Pulmonary:     Effort: Pulmonary effort is normal. No respiratory distress.     Breath sounds: No wheezing or rales.  Abdominal:     General: There is no distension.     Palpations: Abdomen is soft.  Musculoskeletal:        General: No tenderness.     Cervical back: Normal range of motion and neck supple.     Right lower leg: No edema.     Left lower leg: No edema.  Skin:    General: Skin is warm and dry.  Neurological:     General: No focal deficit present.     Mental Status: He is alert and oriented to person, place, and time.  Psychiatric:        Mood and Affect: Mood is anxious.        Behavior: Behavior normal.    Assessment and Plan:  Chronic Heart failure with reduced ejection fraction- - NYHA II - euvolemic today - weighing daily; reminded to call for an overnight weight gain of > 2 pounds or a weekly weight gain of > 5 pounds - weight down 5 pounds from last visit here 2 months ago - not adding salt to his food - participating in paramedicine program  - saw cardiology Rockey Situ) 02/09/22 - on GDMT coreg  - renal function may limit other GDMT; may need nephrology referral in the future - could consider jardiance if renal function remains stable - BNP 12/17/21 was 122.8 - has received his flu vaccine for this season  2. HTN with CKD- - BP looks good (134/83) - saw PCP (Abernathy) 03/29/22 - BMP 05/16/22 reviewed and showed Na 136, K 4.0, Cr 2.61 & gfr 26  3. DM-  - A1C 7.0% 08/06/21 - not  checking his glucose at home because he says something is wrong with his meter; advised him to take the meter when he goes to his PCP and have them look at it; can also try to replace the batteries  4: OSA- - wears CPAP with O2 HS, for "all hours I sleep, including naps"   Medication bottles reviewed.   Return in 6 months, sooner if needed.

## 2022-05-23 ENCOUNTER — Encounter: Payer: Self-pay | Admitting: Family

## 2022-05-23 ENCOUNTER — Telehealth (HOSPITAL_COMMUNITY): Payer: Self-pay

## 2022-05-23 ENCOUNTER — Ambulatory Visit: Payer: Medicare PPO | Attending: Family | Admitting: Family

## 2022-05-23 VITALS — BP 134/83 | HR 70 | Resp 18 | Wt 229.2 lb

## 2022-05-23 DIAGNOSIS — R058 Other specified cough: Secondary | ICD-10-CM | POA: Insufficient documentation

## 2022-05-23 DIAGNOSIS — J441 Chronic obstructive pulmonary disease with (acute) exacerbation: Secondary | ICD-10-CM | POA: Diagnosis not present

## 2022-05-23 DIAGNOSIS — G4733 Obstructive sleep apnea (adult) (pediatric): Secondary | ICD-10-CM | POA: Insufficient documentation

## 2022-05-23 DIAGNOSIS — Z794 Long term (current) use of insulin: Secondary | ICD-10-CM

## 2022-05-23 DIAGNOSIS — N184 Chronic kidney disease, stage 4 (severe): Secondary | ICD-10-CM | POA: Insufficient documentation

## 2022-05-23 DIAGNOSIS — Z7901 Long term (current) use of anticoagulants: Secondary | ICD-10-CM | POA: Insufficient documentation

## 2022-05-23 DIAGNOSIS — Z79899 Other long term (current) drug therapy: Secondary | ICD-10-CM | POA: Diagnosis not present

## 2022-05-23 DIAGNOSIS — I10 Essential (primary) hypertension: Secondary | ICD-10-CM | POA: Diagnosis not present

## 2022-05-23 DIAGNOSIS — I5020 Unspecified systolic (congestive) heart failure: Secondary | ICD-10-CM | POA: Diagnosis not present

## 2022-05-23 DIAGNOSIS — Z8616 Personal history of COVID-19: Secondary | ICD-10-CM | POA: Insufficient documentation

## 2022-05-23 DIAGNOSIS — Z8673 Personal history of transient ischemic attack (TIA), and cerebral infarction without residual deficits: Secondary | ICD-10-CM | POA: Diagnosis not present

## 2022-05-23 DIAGNOSIS — Z87891 Personal history of nicotine dependence: Secondary | ICD-10-CM | POA: Insufficient documentation

## 2022-05-23 DIAGNOSIS — R531 Weakness: Secondary | ICD-10-CM | POA: Insufficient documentation

## 2022-05-23 DIAGNOSIS — I5042 Chronic combined systolic (congestive) and diastolic (congestive) heart failure: Secondary | ICD-10-CM | POA: Insufficient documentation

## 2022-05-23 DIAGNOSIS — Z7984 Long term (current) use of oral hypoglycemic drugs: Secondary | ICD-10-CM | POA: Insufficient documentation

## 2022-05-23 DIAGNOSIS — Z9049 Acquired absence of other specified parts of digestive tract: Secondary | ICD-10-CM | POA: Insufficient documentation

## 2022-05-23 DIAGNOSIS — I2510 Atherosclerotic heart disease of native coronary artery without angina pectoris: Secondary | ICD-10-CM | POA: Insufficient documentation

## 2022-05-23 DIAGNOSIS — R0602 Shortness of breath: Secondary | ICD-10-CM | POA: Diagnosis not present

## 2022-05-23 DIAGNOSIS — K219 Gastro-esophageal reflux disease without esophagitis: Secondary | ICD-10-CM | POA: Insufficient documentation

## 2022-05-23 DIAGNOSIS — I428 Other cardiomyopathies: Secondary | ICD-10-CM | POA: Insufficient documentation

## 2022-05-23 DIAGNOSIS — I130 Hypertensive heart and chronic kidney disease with heart failure and stage 1 through stage 4 chronic kidney disease, or unspecified chronic kidney disease: Secondary | ICD-10-CM | POA: Diagnosis not present

## 2022-05-23 DIAGNOSIS — E785 Hyperlipidemia, unspecified: Secondary | ICD-10-CM | POA: Insufficient documentation

## 2022-05-23 DIAGNOSIS — F419 Anxiety disorder, unspecified: Secondary | ICD-10-CM | POA: Diagnosis not present

## 2022-05-23 DIAGNOSIS — E1122 Type 2 diabetes mellitus with diabetic chronic kidney disease: Secondary | ICD-10-CM | POA: Insufficient documentation

## 2022-05-23 NOTE — Patient Instructions (Addendum)
Continue weighing daily and call for an overnight weight gain of 3 pounds or more or a weekly weight gain of more than 5 pounds.   If you have voicemail, please make sure your mailbox is cleaned out so that we may leave a message and please make sure to listen to any voicemails.    Take your blood pressure to your next appointment so they can look at the machine.

## 2022-05-23 NOTE — Telephone Encounter (Signed)
Have called and left messages several times with no return call.    [place] [phone]

## 2022-05-28 IMAGING — DX DG CHEST 1V PORT
1 series · 1 of 1 positions shown · non-contrast
Comparison: 10/06/2020

CLINICAL DATA: Dyspnea

EXAM:
PORTABLE CHEST 1 VIEW

[chest ap]
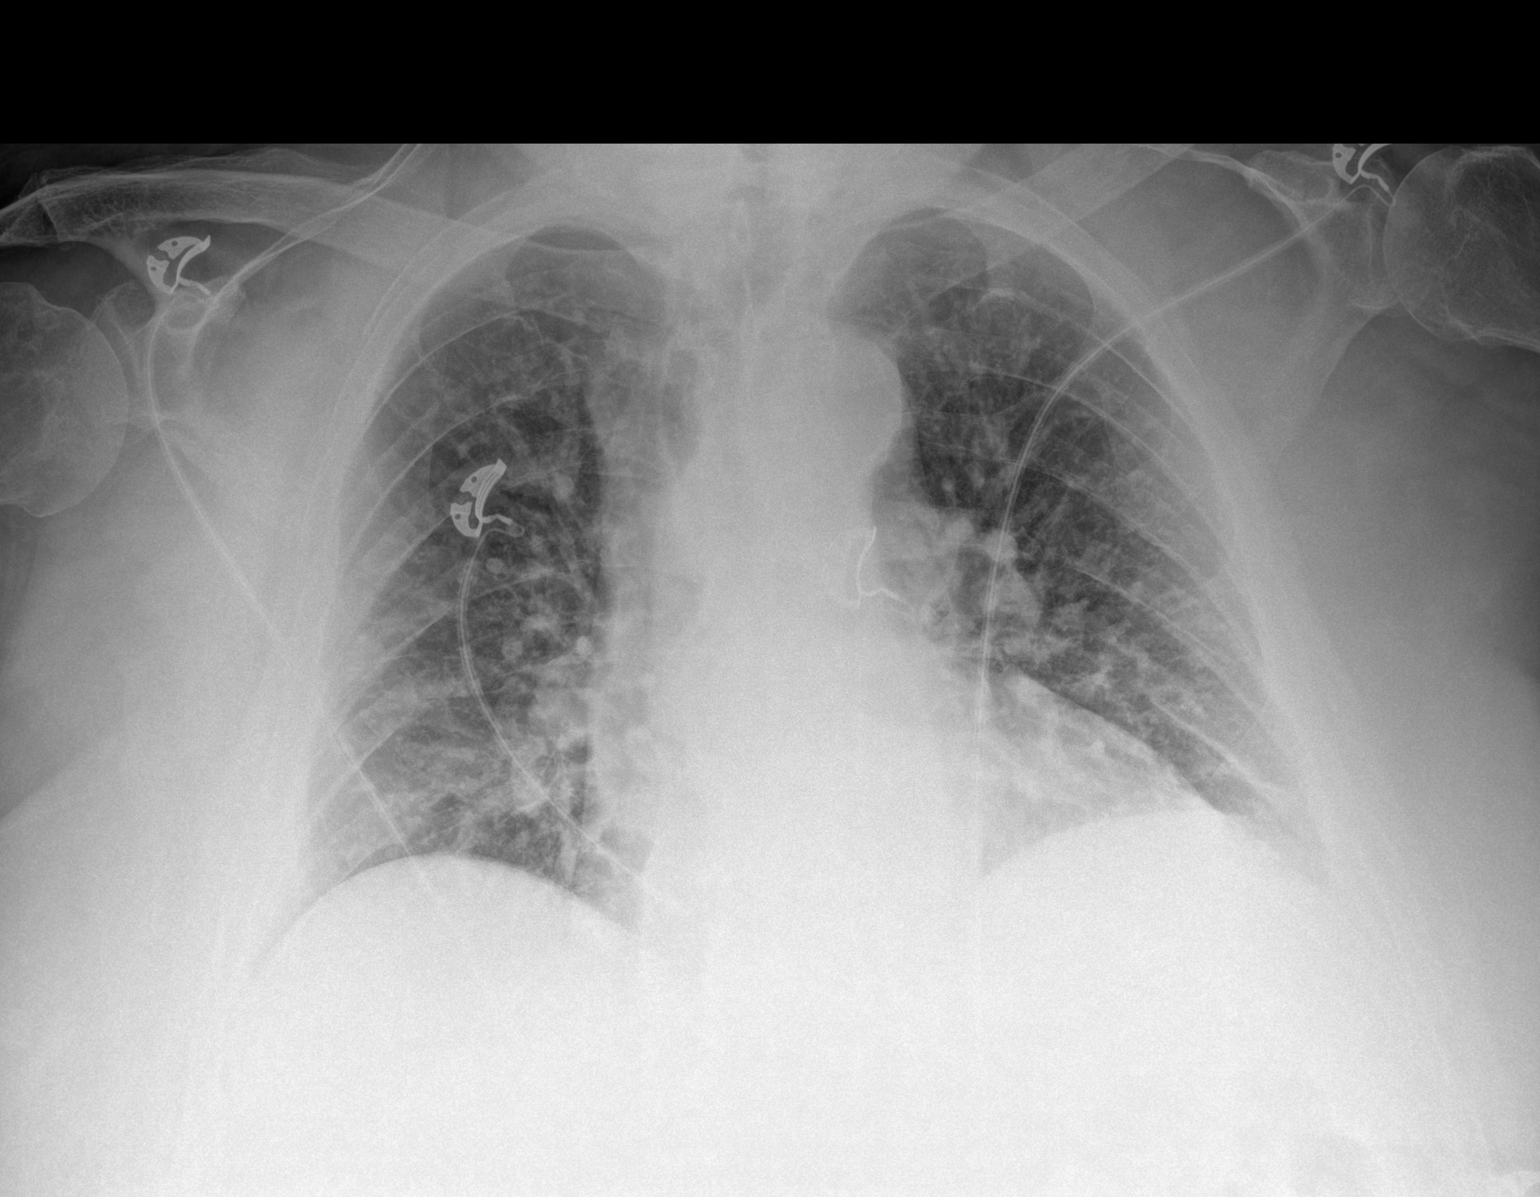

[1 of 1 positions shown; findings below may reference images not displayed]

FINDINGS: Lung volumes are small, but are symmetric. While this contributes to
apparent interstitial thickening, a superimposed infectious or
inflammatory interstitial infiltrate is noted within the a left lung
base. No pneumothorax or pleural effusion. Cardiac size within
normal limits. No acute bone abnormality.
IMPRESSION: Left basilar focal pulmonary infiltrate, likely infectious or
inflammatory.

Pulmonary hypoinflation.

## 2022-05-28 IMAGING — CR DG FEMUR 2+V*R*
4 series · 4 of 4 positions shown · non-contrast
Comparison: 06/17/2019, 04/29/2019

CLINICAL DATA: Right upper thigh pain

EXAM:
RIGHT FEMUR 2 VIEWS

[femur ap (1 of 2)]
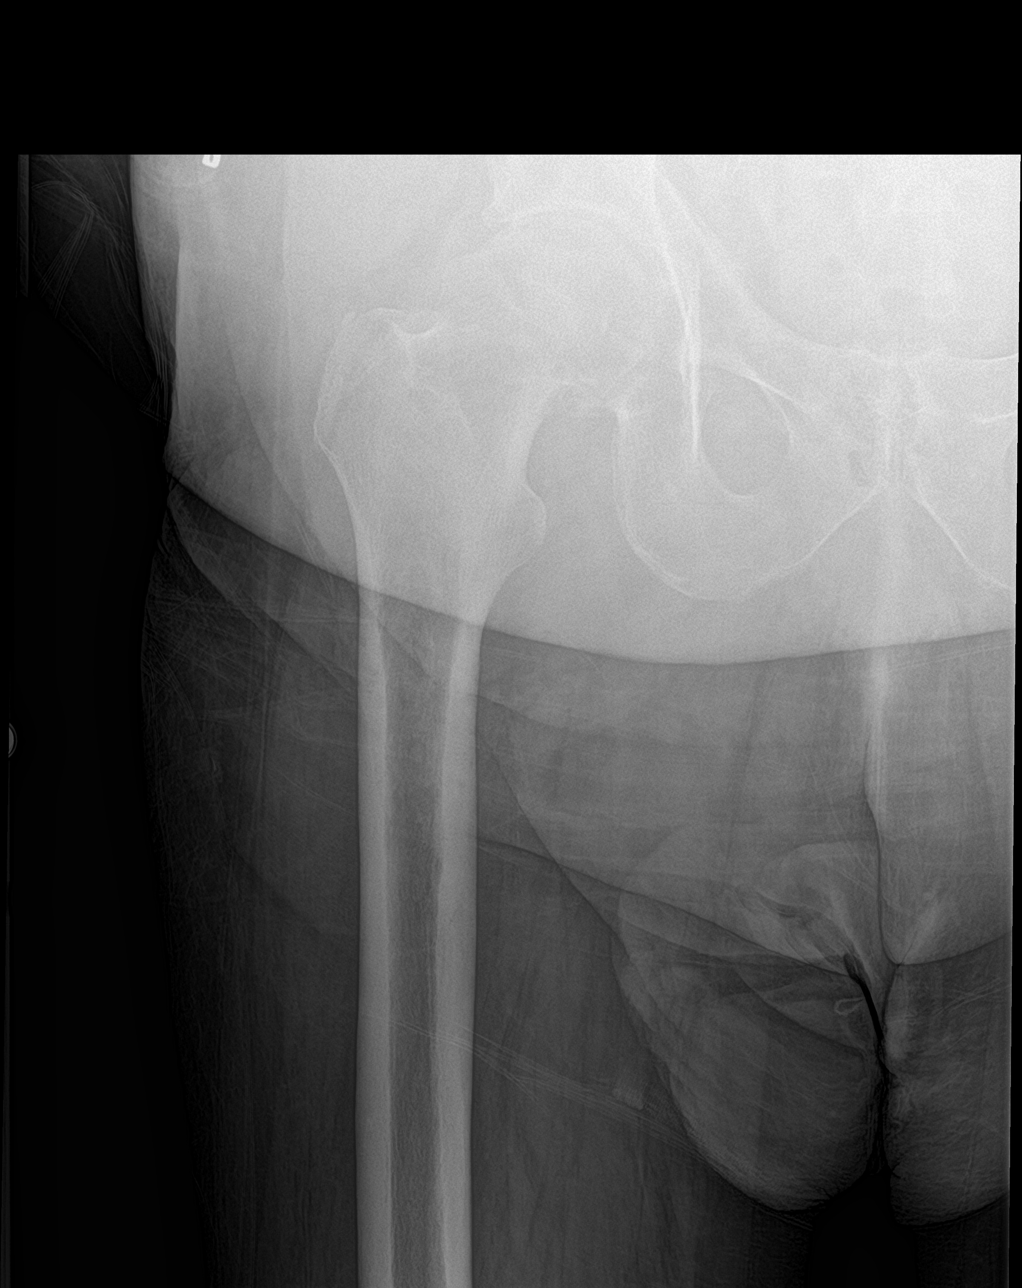

[femur ap (2 of 2)]
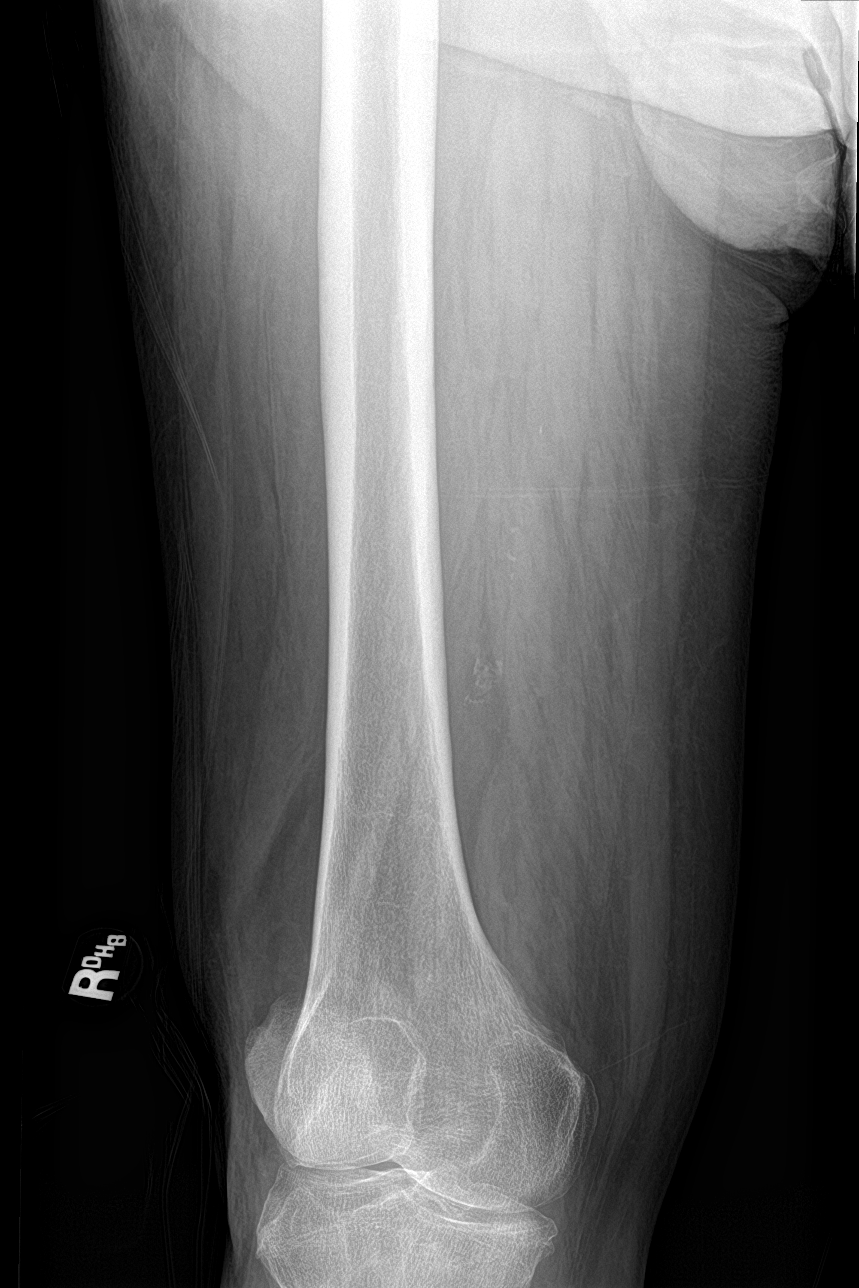

[femur lat (1 of 2)]
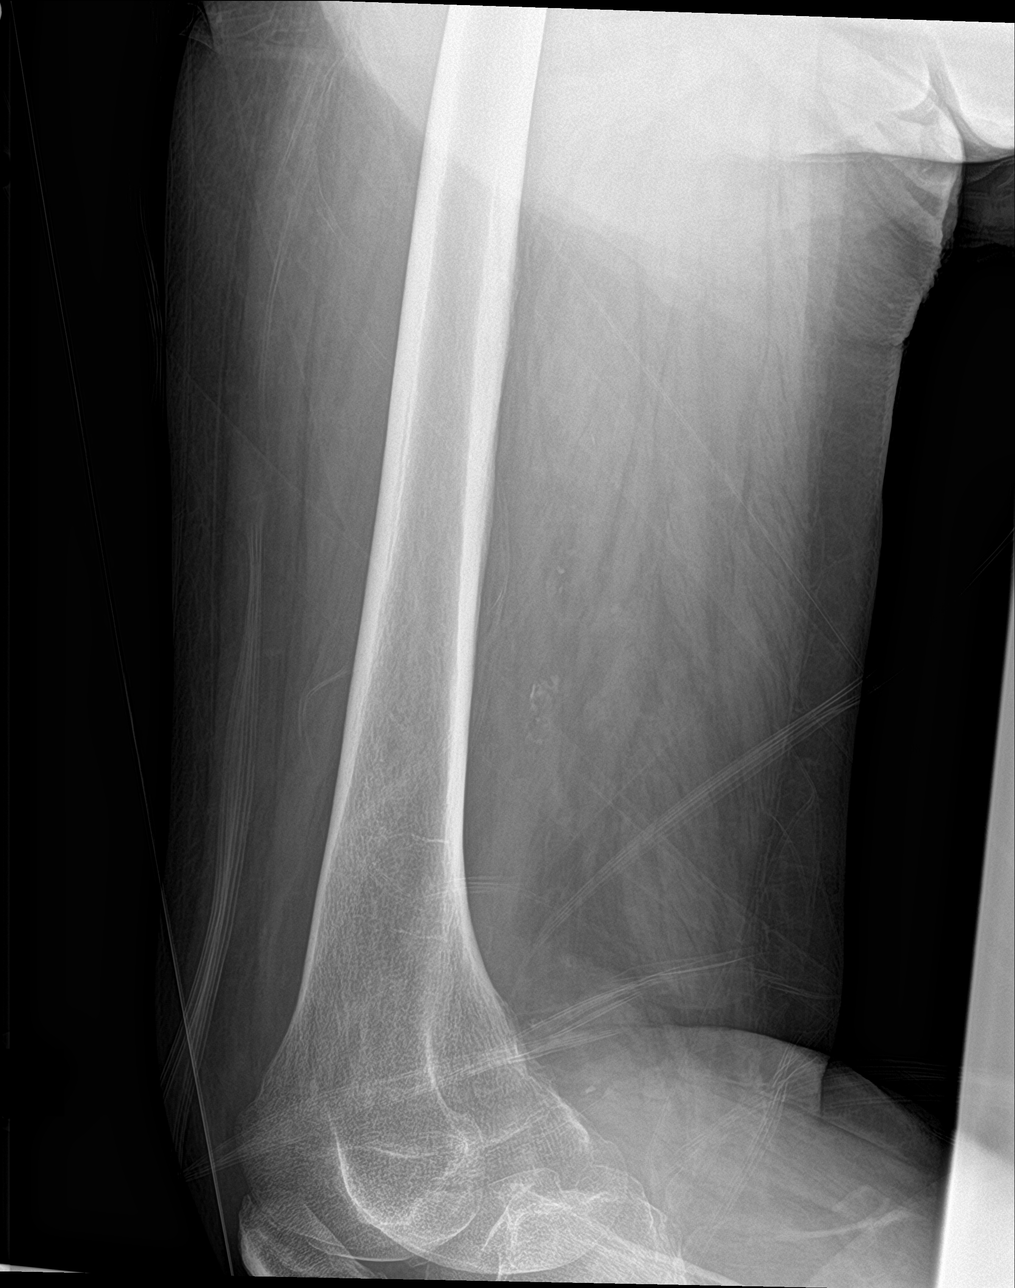

[femur lat (2 of 2)]
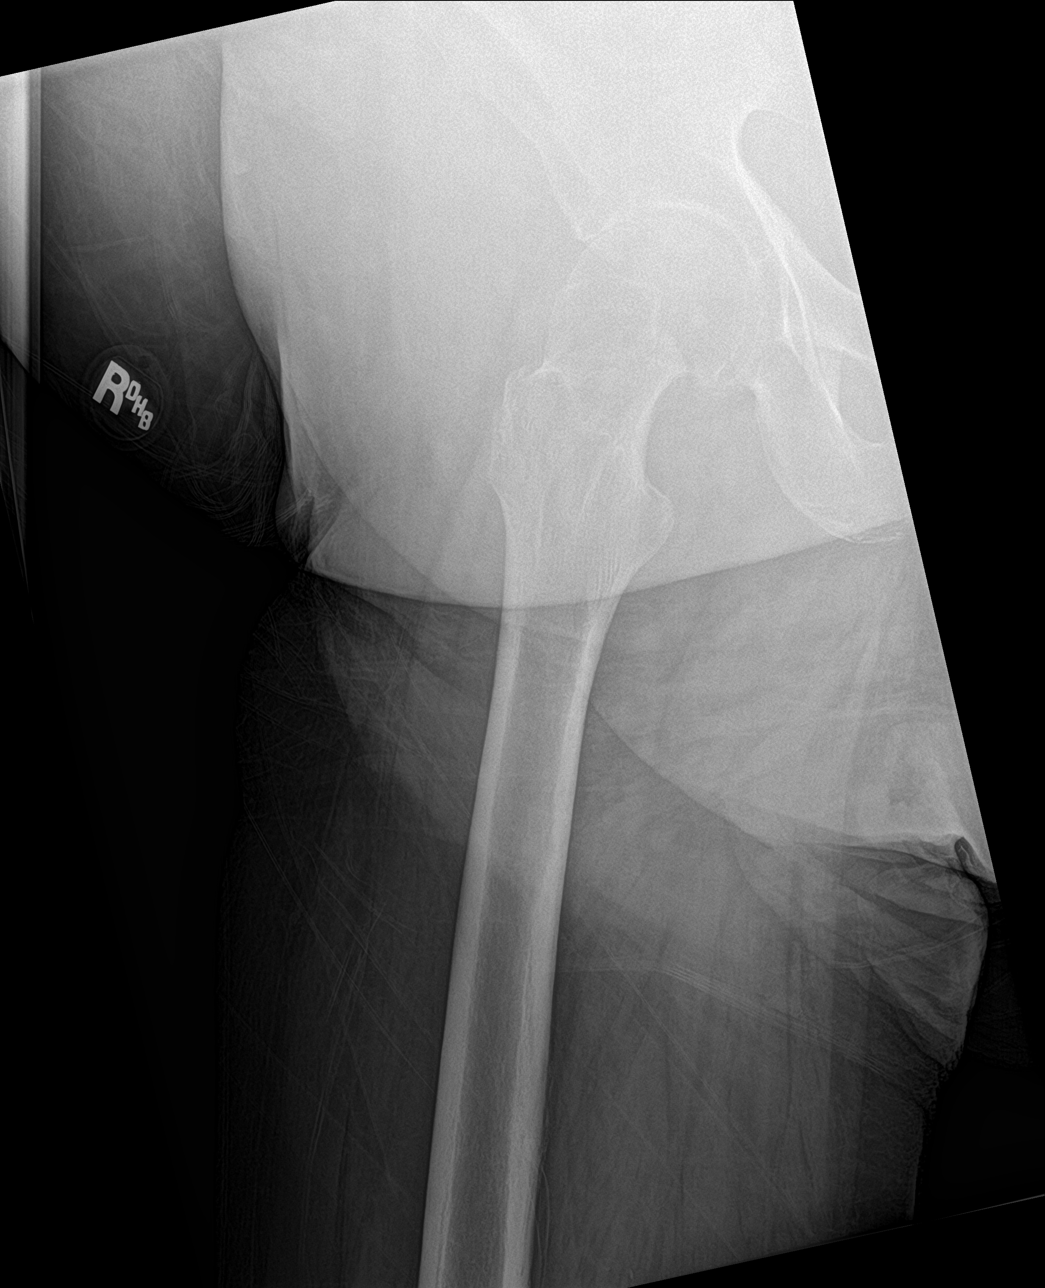

[4 of 4 positions shown; findings below may reference images not displayed]

FINDINGS: No fracture or malalignment. Prominent osteophytes about the femoral
head neck junction as before. Soft tissues are unremarkable.
IMPRESSION: No acute osseous abnormality.

## 2022-05-29 IMAGING — NM NM PULMONARY PERF PARTICULATE
1 series · 8 of 8 positions shown · non-contrast
Comparison: 05/11/2019 perfusion study. Chest x-ray from earlier
yesterday

CLINICAL DATA: Shortness of breath

EXAM:
NUCLEAR MEDICINE PERFUSION LUNG SCAN
TECHNIQUE: Perfusion images were obtained in multiple projections after
intravenous injection of radiopharmaceutical.
Ventilation scans intentionally deferred if perfusion scan and chest
x-ray adequate for interpretation during COVID 19 epidemic.
RADIOPHARMACEUTICALS:  4.37 mCi Jc-SSm MAA IV

[Series 1000: lung perfusion · 1.65mm/px · 4 acquisitions, 8 frames shown]
[im 1/4]
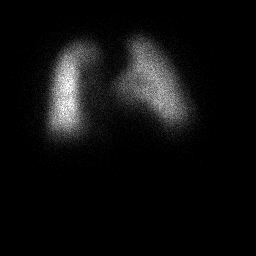
[im 1/4]
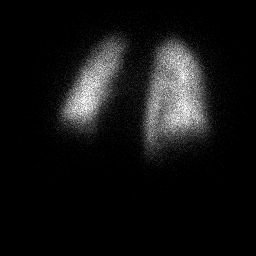
[im 2/4]
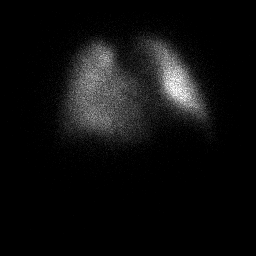
[im 2/4]
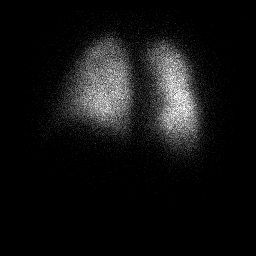
[im 3/4  full-range]
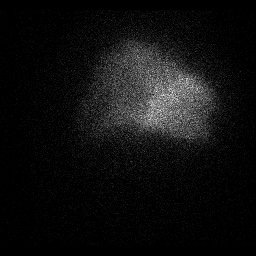
[im 3/4  full-range]
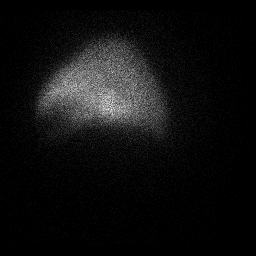
[im 4/4  full-range]
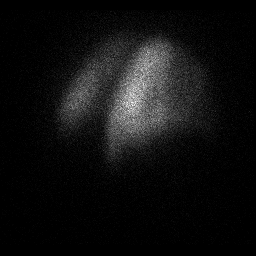
[im 4/4  full-range]
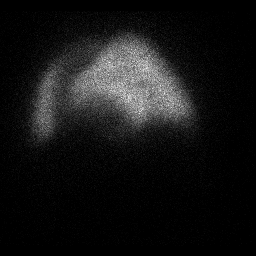

[8 of 8 positions shown; findings below may reference images not displayed]

FINDINGS: No perfusion defects.  Stable perfusion when compared to prior.
IMPRESSION: Stable lung perfusion.  Negative for pulmonary embolism.

## 2022-06-09 ENCOUNTER — Other Ambulatory Visit: Payer: Self-pay | Admitting: Nurse Practitioner

## 2022-06-09 DIAGNOSIS — Z794 Long term (current) use of insulin: Secondary | ICD-10-CM

## 2022-06-12 IMAGING — CT CT CHEST-ABD-PELV W/O CM
2 of 4 series · 13 of 36 positions shown, 15 images · non-contrast
Comparison: CTA of the chest 01/24/2020. CT the abdomen and pelvis
12/10/2019.

CLINICAL DATA: 65-year-old male with history of hematemesis and
hemoptysis. Substernal chest pain and cough. Shortness of breath.

EXAM:
CT CHEST, ABDOMEN AND PELVIS WITHOUT CONTRAST
TECHNIQUE: Multidetector CT imaging of the chest, abdomen and pelvis was
performed following the standard protocol without IV contrast.

[Series 2: cap wo st · axial · 0.91mm/px · z∈[-449,+96]mm · 10 of 129 slices shown, 12 images]
[im 10/129  mediastinal]
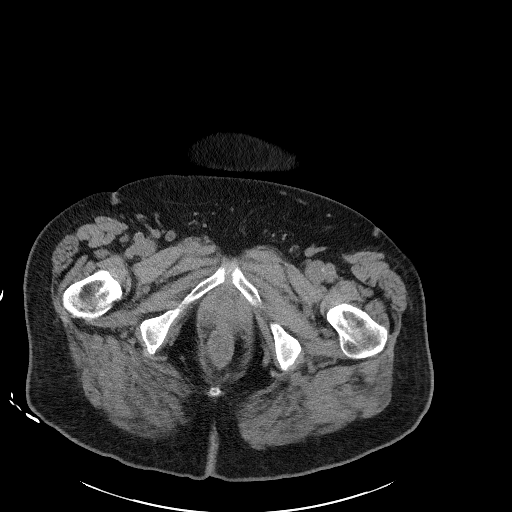
[im 10/129  bone]
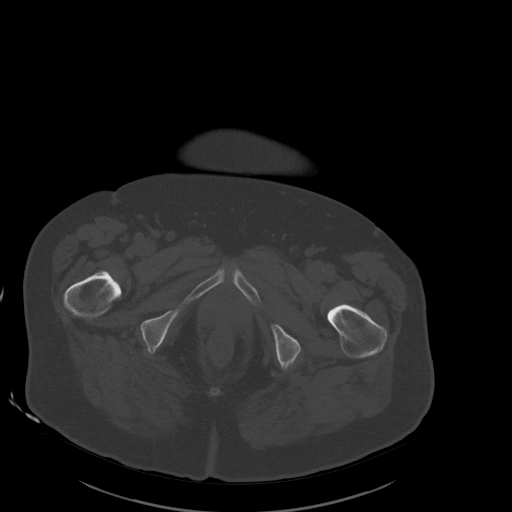
[im 20/129  mediastinal]
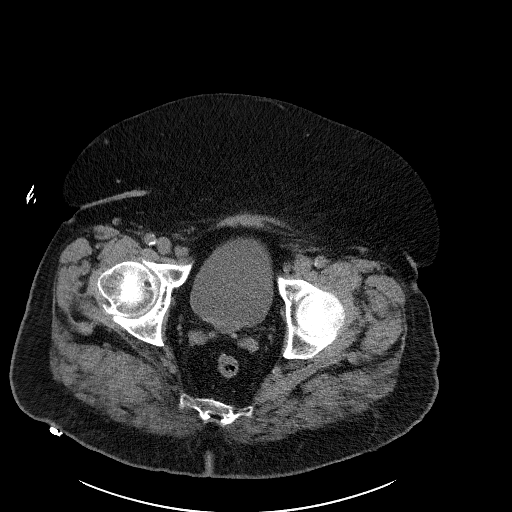
[im 40/129  mediastinal]
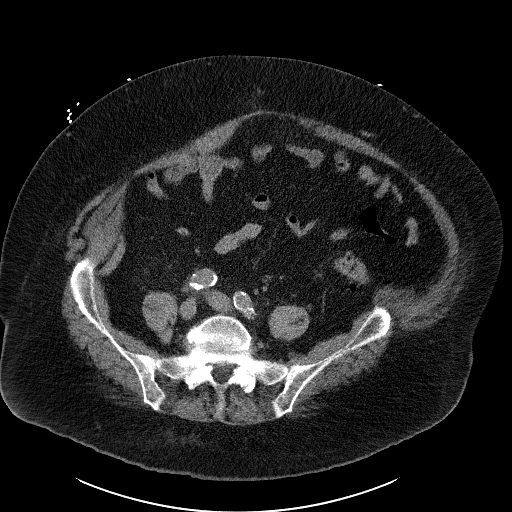
[im 50/129  mediastinal]
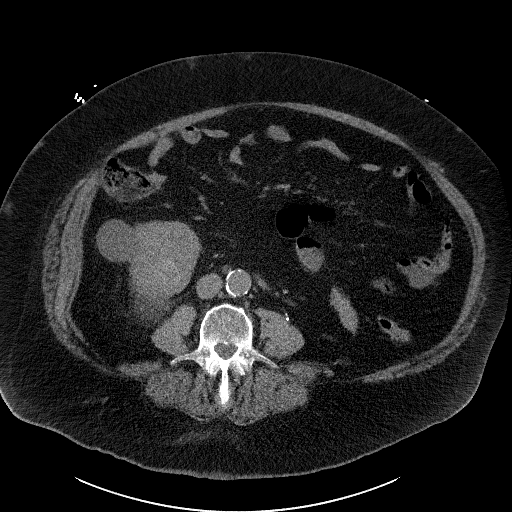
[im 60/129  mediastinal]
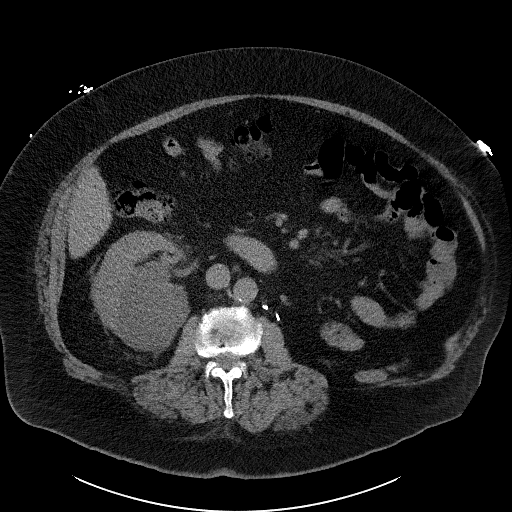
[im 69/129  mediastinal]
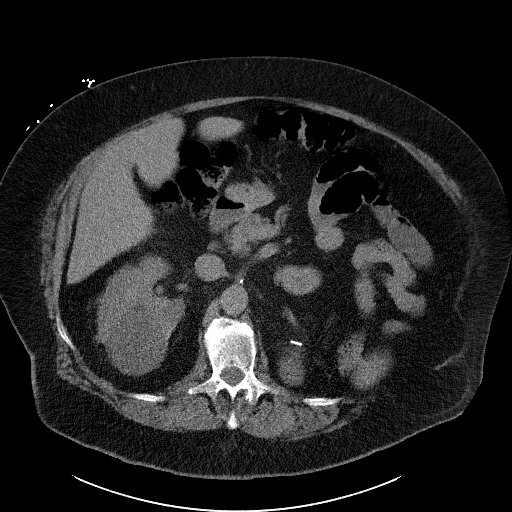
[im 79/129  mediastinal]
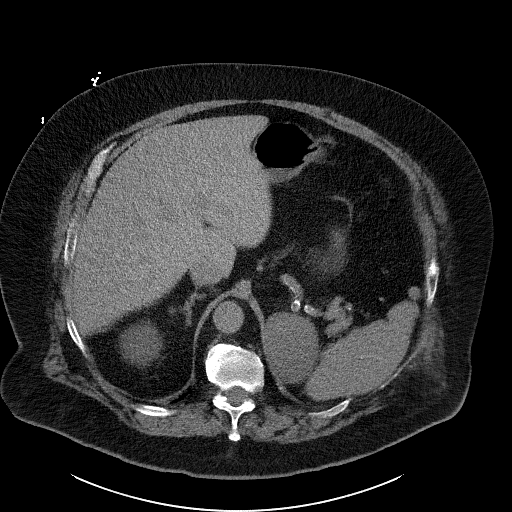
[im 99/129  mediastinal]
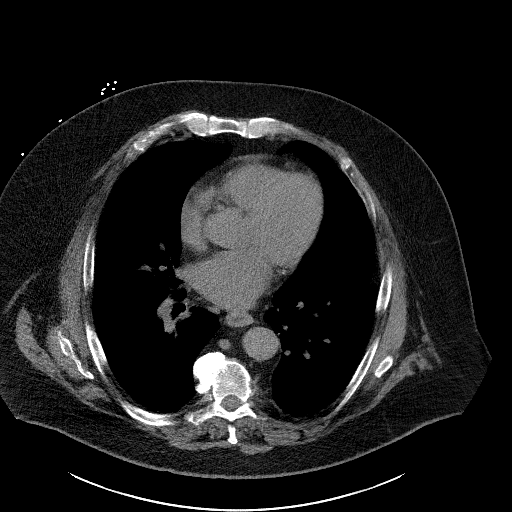
[im 109/129  mediastinal]
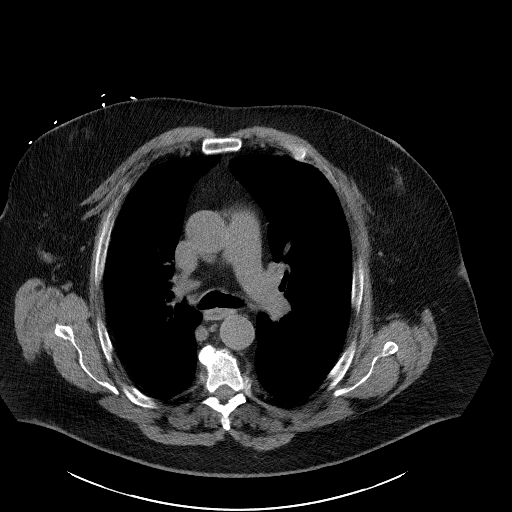
[im 109/129  bone]
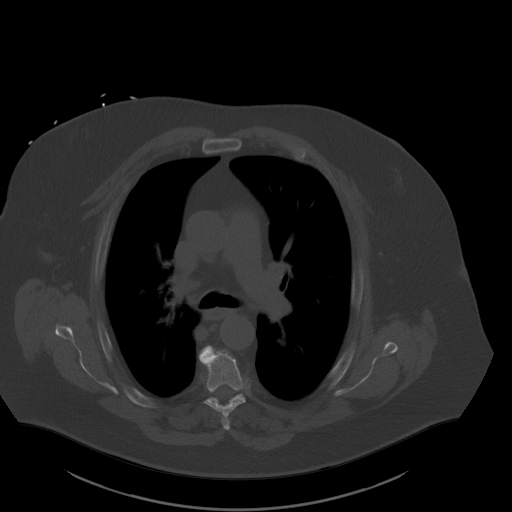
[im 119/129  mediastinal]
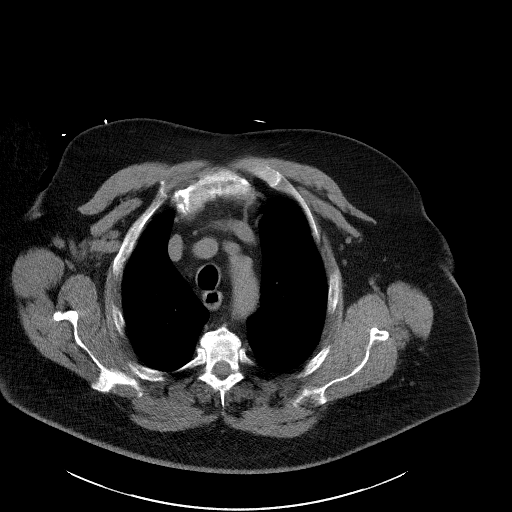

[Series 5: coronal · coronal · 0.97mm/px · 3 of 200 slices shown]
[im 40/200  mediastinal]
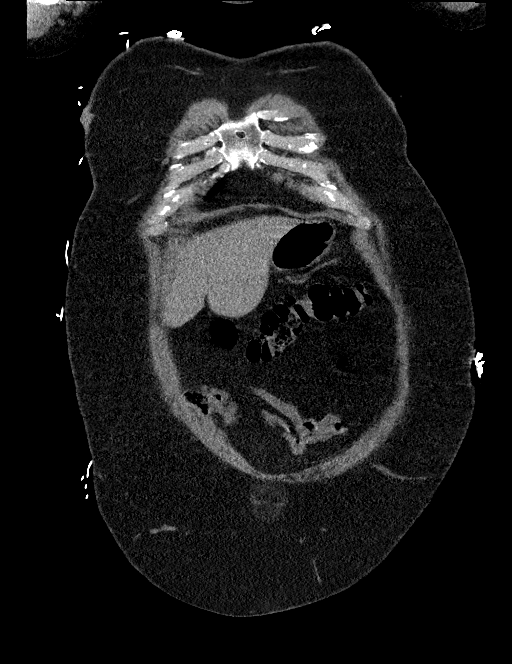
[im 80/200  mediastinal]
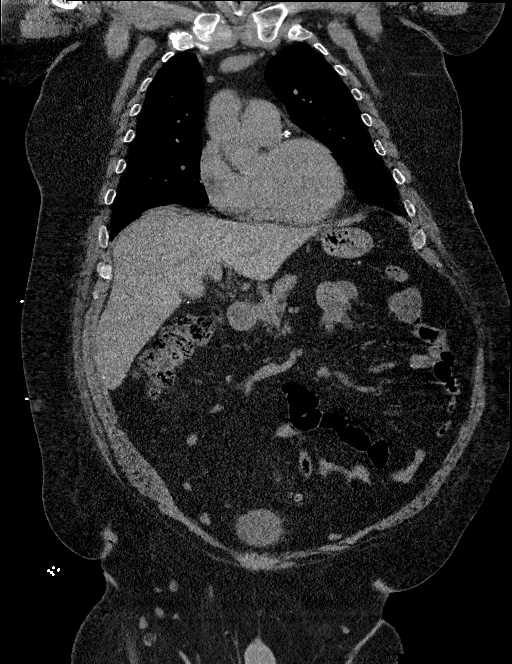
[im 120/200  mediastinal]
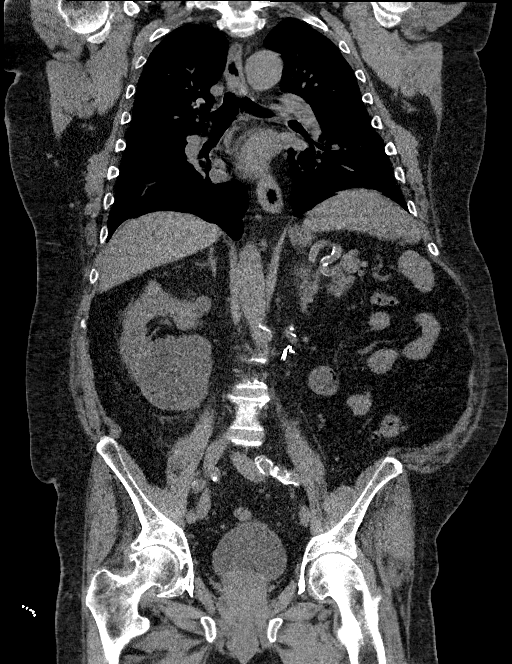

[13 of 36 positions shown; findings below may reference images not displayed]

FINDINGS: CT CHEST FINDINGS

Cardiovascular: Heart size is normal. There is no significant
pericardial fluid, thickening or pericardial calcification. There is
aortic atherosclerosis, as well as atherosclerosis of the great
vessels of the mediastinum and the coronary arteries, including
calcified atherosclerotic plaque in the left main, left anterior
descending and left circumflex coronary arteries.

Mediastinum/Nodes: No pathologically enlarged mediastinal or hilar
lymph nodes. Please note that accurate exclusion of hilar adenopathy
is limited on noncontrast CT scans. Esophagus is unremarkable in
appearance. No axillary lymphadenopathy.

Lungs/Pleura: No suspicious appearing pulmonary nodules or masses
are noted. No acute consolidative airspace disease. No pleural
effusions.

Musculoskeletal: There are no aggressive appearing lytic or blastic
lesions noted in the visualized portions of the skeleton.

CT ABDOMEN PELVIS FINDINGS

Hepatobiliary: No definite suspicious cystic or solid hepatic
lesions are confidently identified on today's noncontrast CT
examination. Status post cholecystectomy.

Pancreas: No definite pancreatic mass or peripancreatic fluid
collections or inflammatory changes noted on today's noncontrast CT
examination.

Spleen: Multiple calcified granulomas scattered throughout the
spleen.

Adrenals/Urinary Tract: Status post left radical nephrectomy.
Multiple right renal lesions, incompletely characterized on today's
non-contrast CT examination, but statistically likely to represent
cysts, largest of which measures 7.1 cm in the posterior aspect of
the interpolar region of the right kidney. Additional complex lesion
in the lower pole of the right kidney which demonstrates some
internal high attenuation measuring 2.9 cm in diameter (coronal
image one hundred eight of series 5). Right adrenal gland is normal.
There is a 7.7 cm low-attenuation lesion associated with the left
adrenal gland, incompletely characterized on today's examination,
but slowly growing when compared to prior studies (5.1 x 4.1 cm on
remote prior CT the abdomen and pelvis 01/31/2014), favored to
represent an adrenal cyst, although a slow growing adrenal neoplasm
is difficult to entirely exclude. No hydroureteronephrosis. Urinary
bladder is normal in appearance.

Stomach/Bowel: The appearance of the stomach is normal. There is no
pathologic dilatation of small bowel or colon. Numerous colonic
diverticulae are noted, particularly in the sigmoid colon, without
surrounding phlegm [REDACTED] changes to suggest an acute diverticulitis
at this time. Normal appendix.

Vascular/Lymphatic: Aortic atherosclerosis. No lymphadenopathy noted
in the abdomen or pelvis.

Reproductive: Prostate gland and seminal vesicles are unremarkable
in appearance.

Other: Large umbilical hernia containing only omental fat. No
significant volume of ascites. No pneumoperitoneum.

Musculoskeletal: There are no aggressive appearing lytic or blastic
lesions noted in the visualized portions of the skeleton.
IMPRESSION: 1. No acute findings are noted in the chest, abdomen or pelvis to
account for the patient's hematemesis or hemoptysis.
2. Slowly growing left adrenal lesion which may simply represent a
mildly complex adrenal cyst, however, the possibility of a slowly
growing adrenal neoplasm warrants consideration. Further evaluation
with nonemergent adrenal protocol CT scan is recommended in the near
future to provide more definitive characterization.
3. At the time of follow-up adrenal protocol CT, attention to the
indeterminate 2.9 cm lesion in the lower pole of the right kidney is
recommended to exclude neoplasm.
4. Colonic diverticulosis without evidence of acute diverticulitis
at this time.
5. Large umbilical hernia containing only omental fat. No associated
bowel incarceration or obstruction at this time.
6. Aortic atherosclerosis, in addition to left main and 2 vessel
coronary artery disease. Please note that although the presence of
coronary artery calcium documents the presence of coronary artery
disease, the severity of this disease and any potential stenosis
cannot be assessed on this non-gated CT examination. Assessment for
potential risk factor modification, dietary therapy or pharmacologic
therapy may be warranted, if clinically indicated.
7. Additional incidental findings, as above.

## 2022-06-12 IMAGING — CR DG CHEST 2V
1 series · 2 of 2 positions shown · non-contrast
Comparison: 11/04/2020

CLINICAL DATA: Chest pain and shortness of breath.  Hematemesis.

EXAM:
CHEST - 2 VIEW

[Series 1: dg chest 2 view · 0.14mm/px · 2 of 2 slices shown]
[im 1/2]
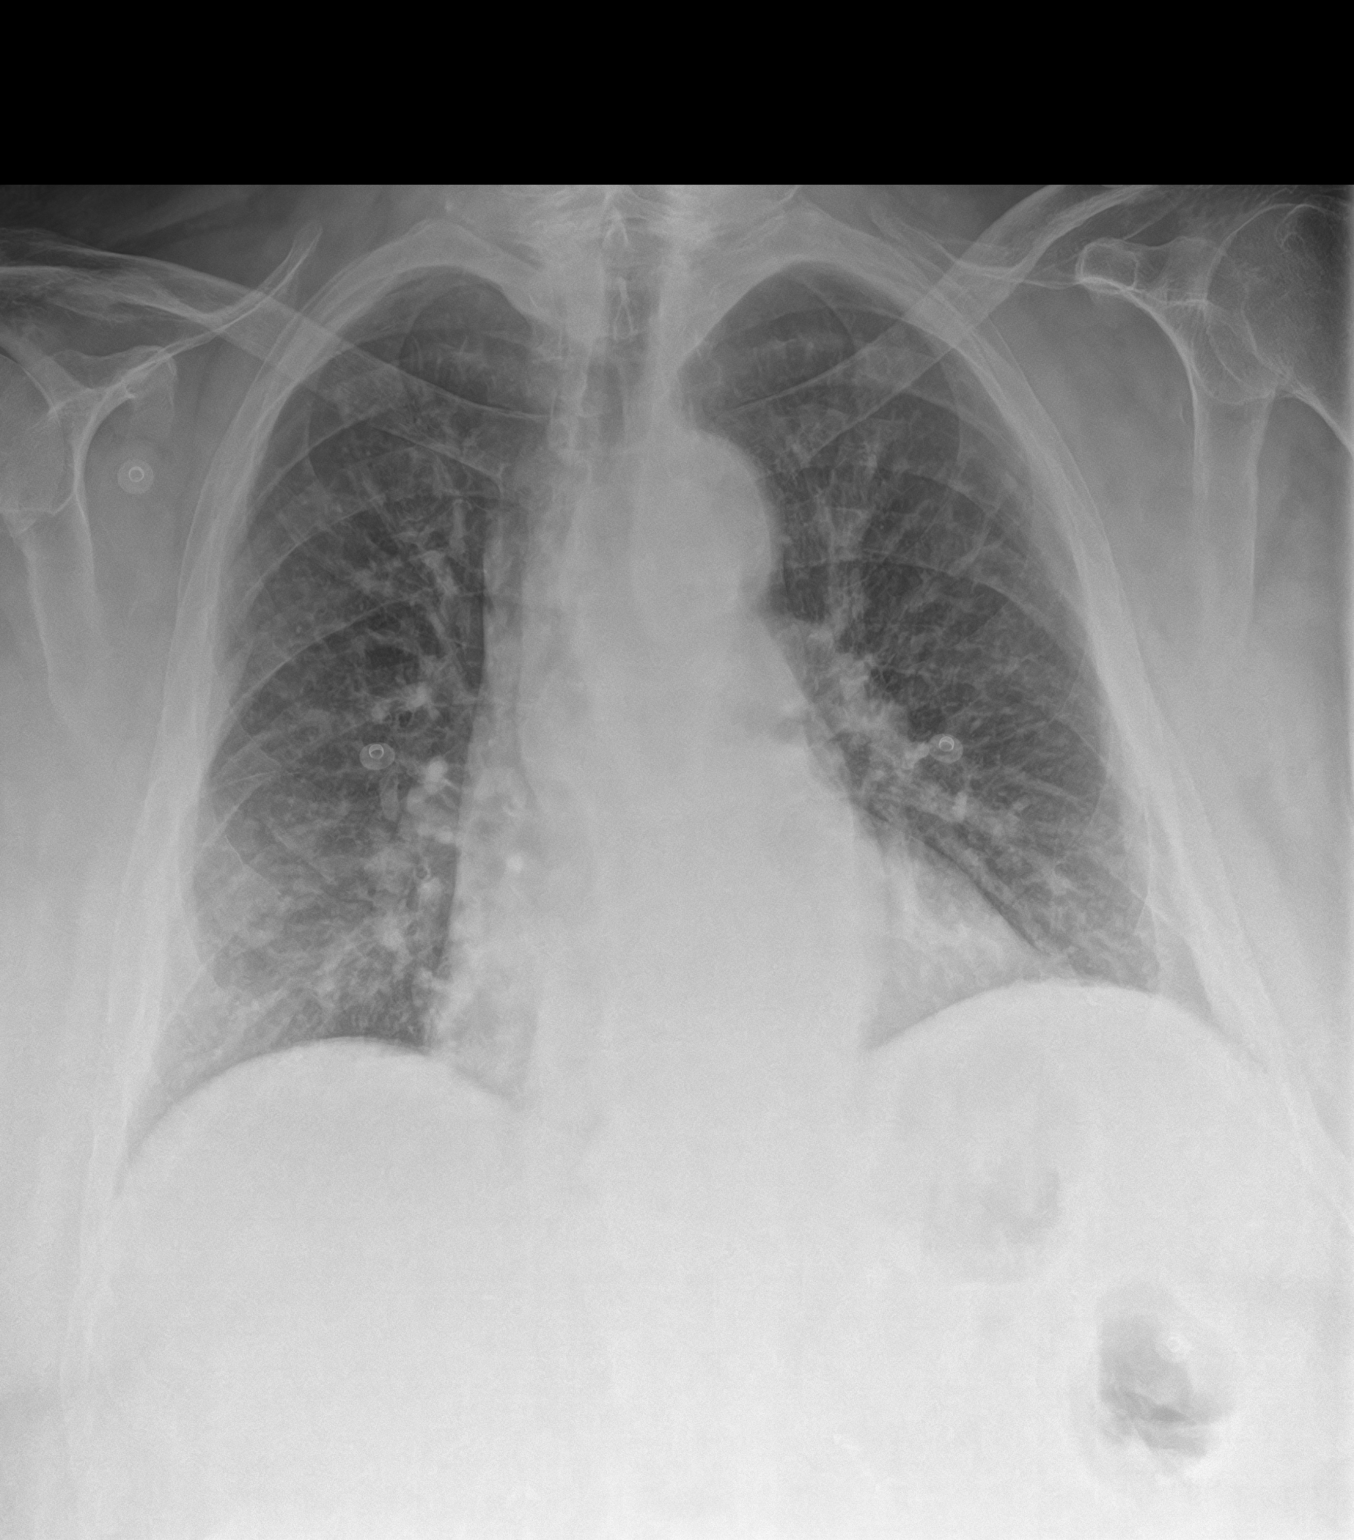
[im 2/2]
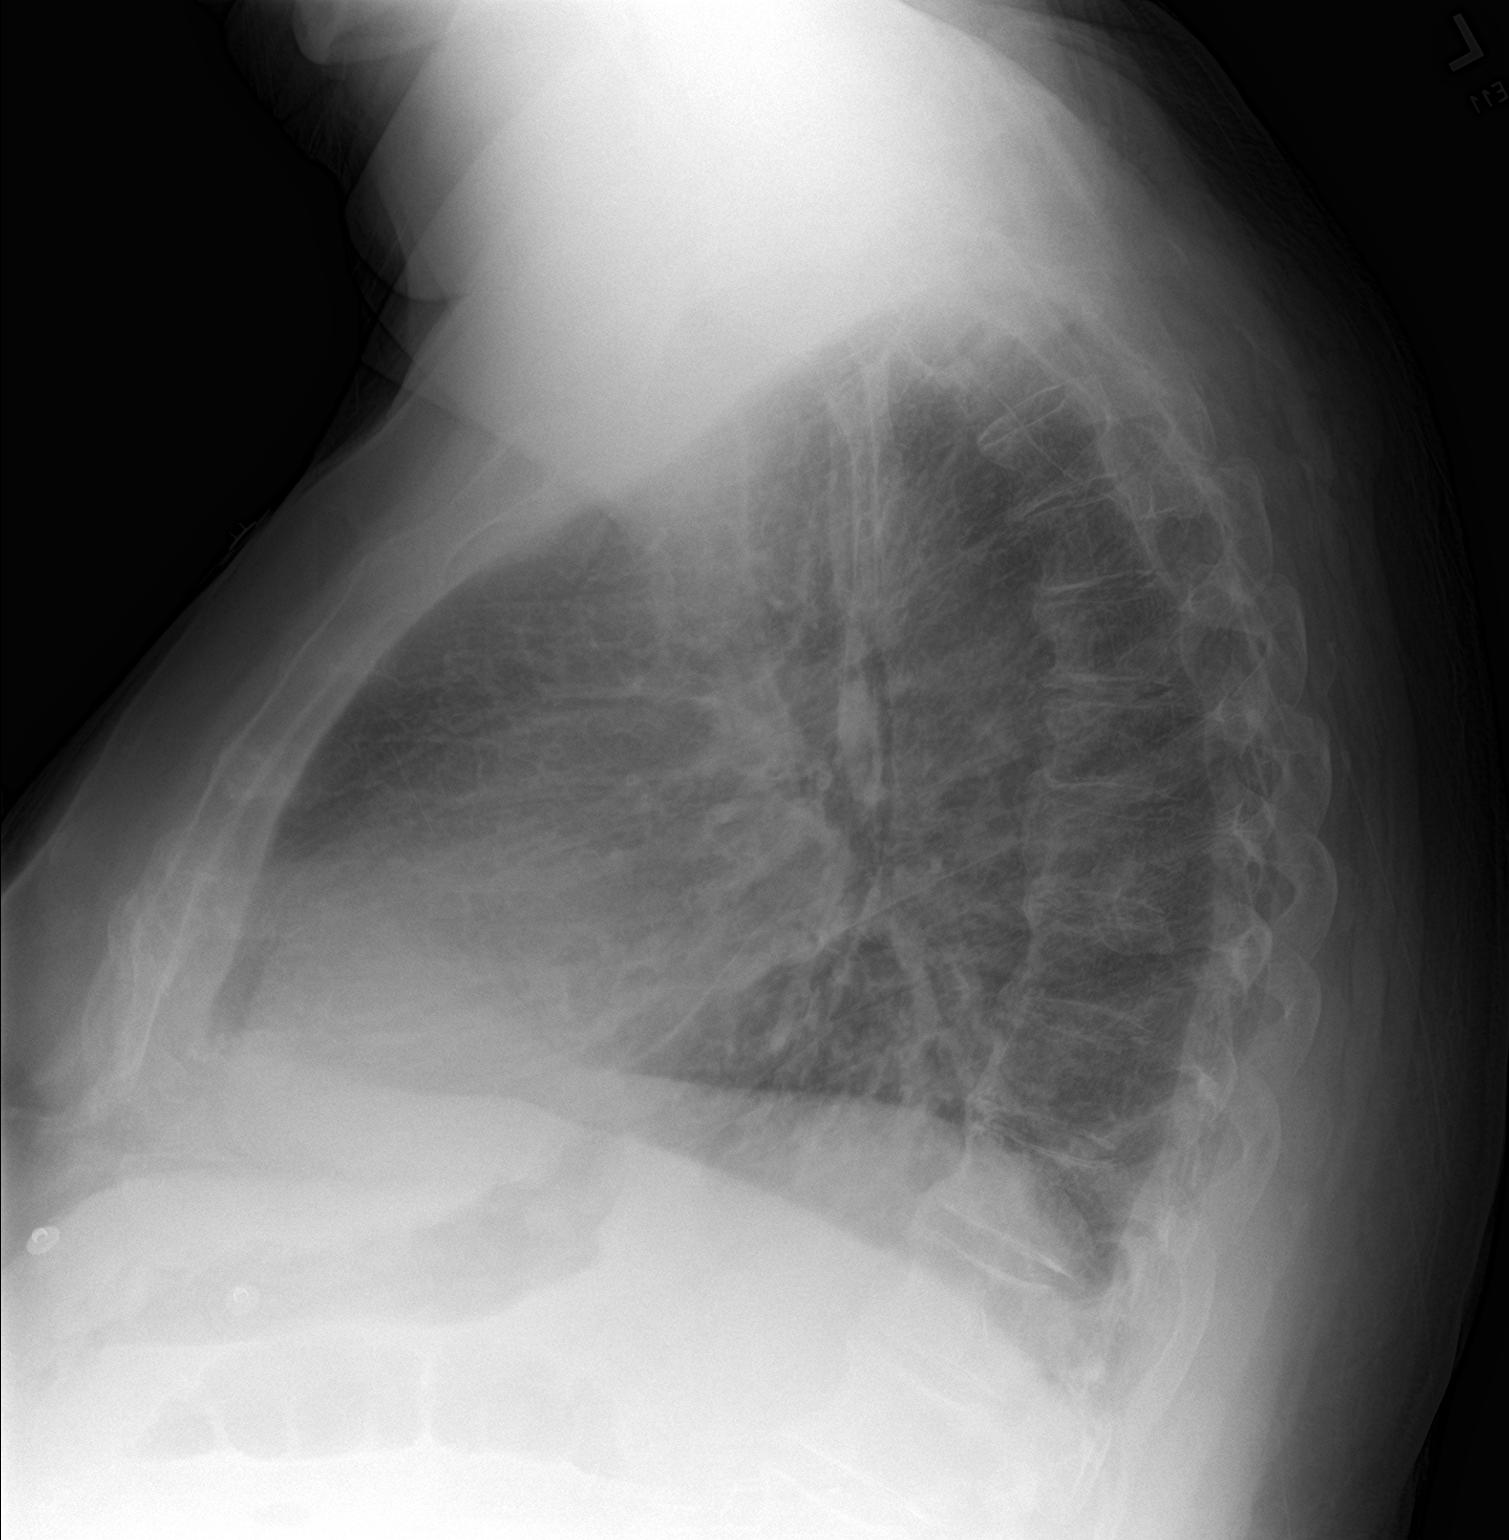

[2 of 2 positions shown; findings below may reference images not displayed]

FINDINGS: Heart size upper limits of normal. Atherosclerosis and tortuosity of
the aorta. Pulmonary venous hypertension without frank edema. No
infiltrate, collapse or effusion. Ordinary degenerative changes
affect the spine.
IMPRESSION: Pulmonary venous hypertension pattern but without evidence of frank
edema. No infiltrate, collapse or effusion.

Aortic atherosclerosis.

## 2022-06-13 DIAGNOSIS — I639 Cerebral infarction, unspecified: Secondary | ICD-10-CM

## 2022-06-13 HISTORY — DX: Cerebral infarction, unspecified: I63.9

## 2022-06-13 IMAGING — CR DG CHEST 2V
2 series · 2 of 2 positions shown · non-contrast
Comparison: October 30, 2020.

CLINICAL DATA: Shortness of breath.

EXAM:
CHEST - 2 VIEW

[chest lat]
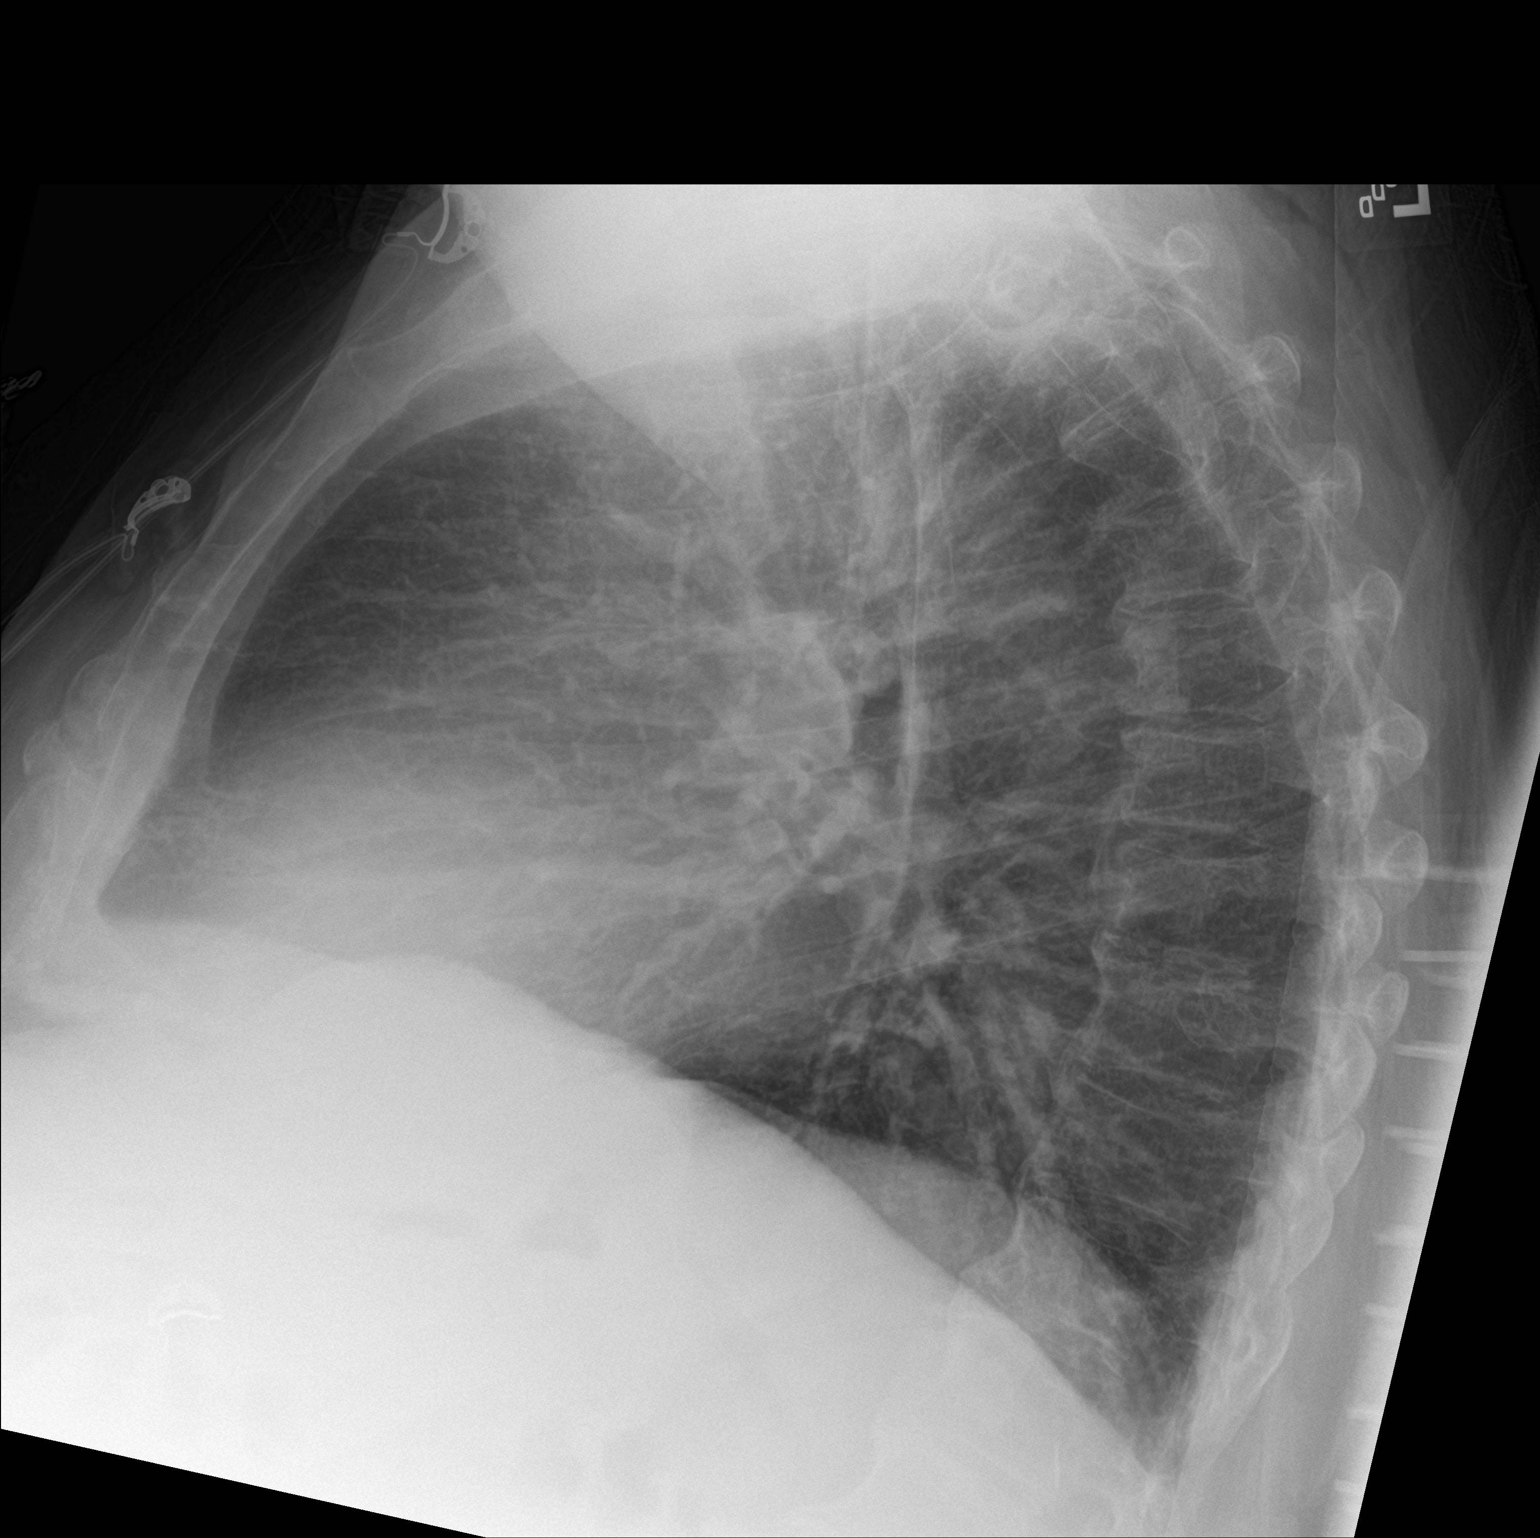

[chest ap]
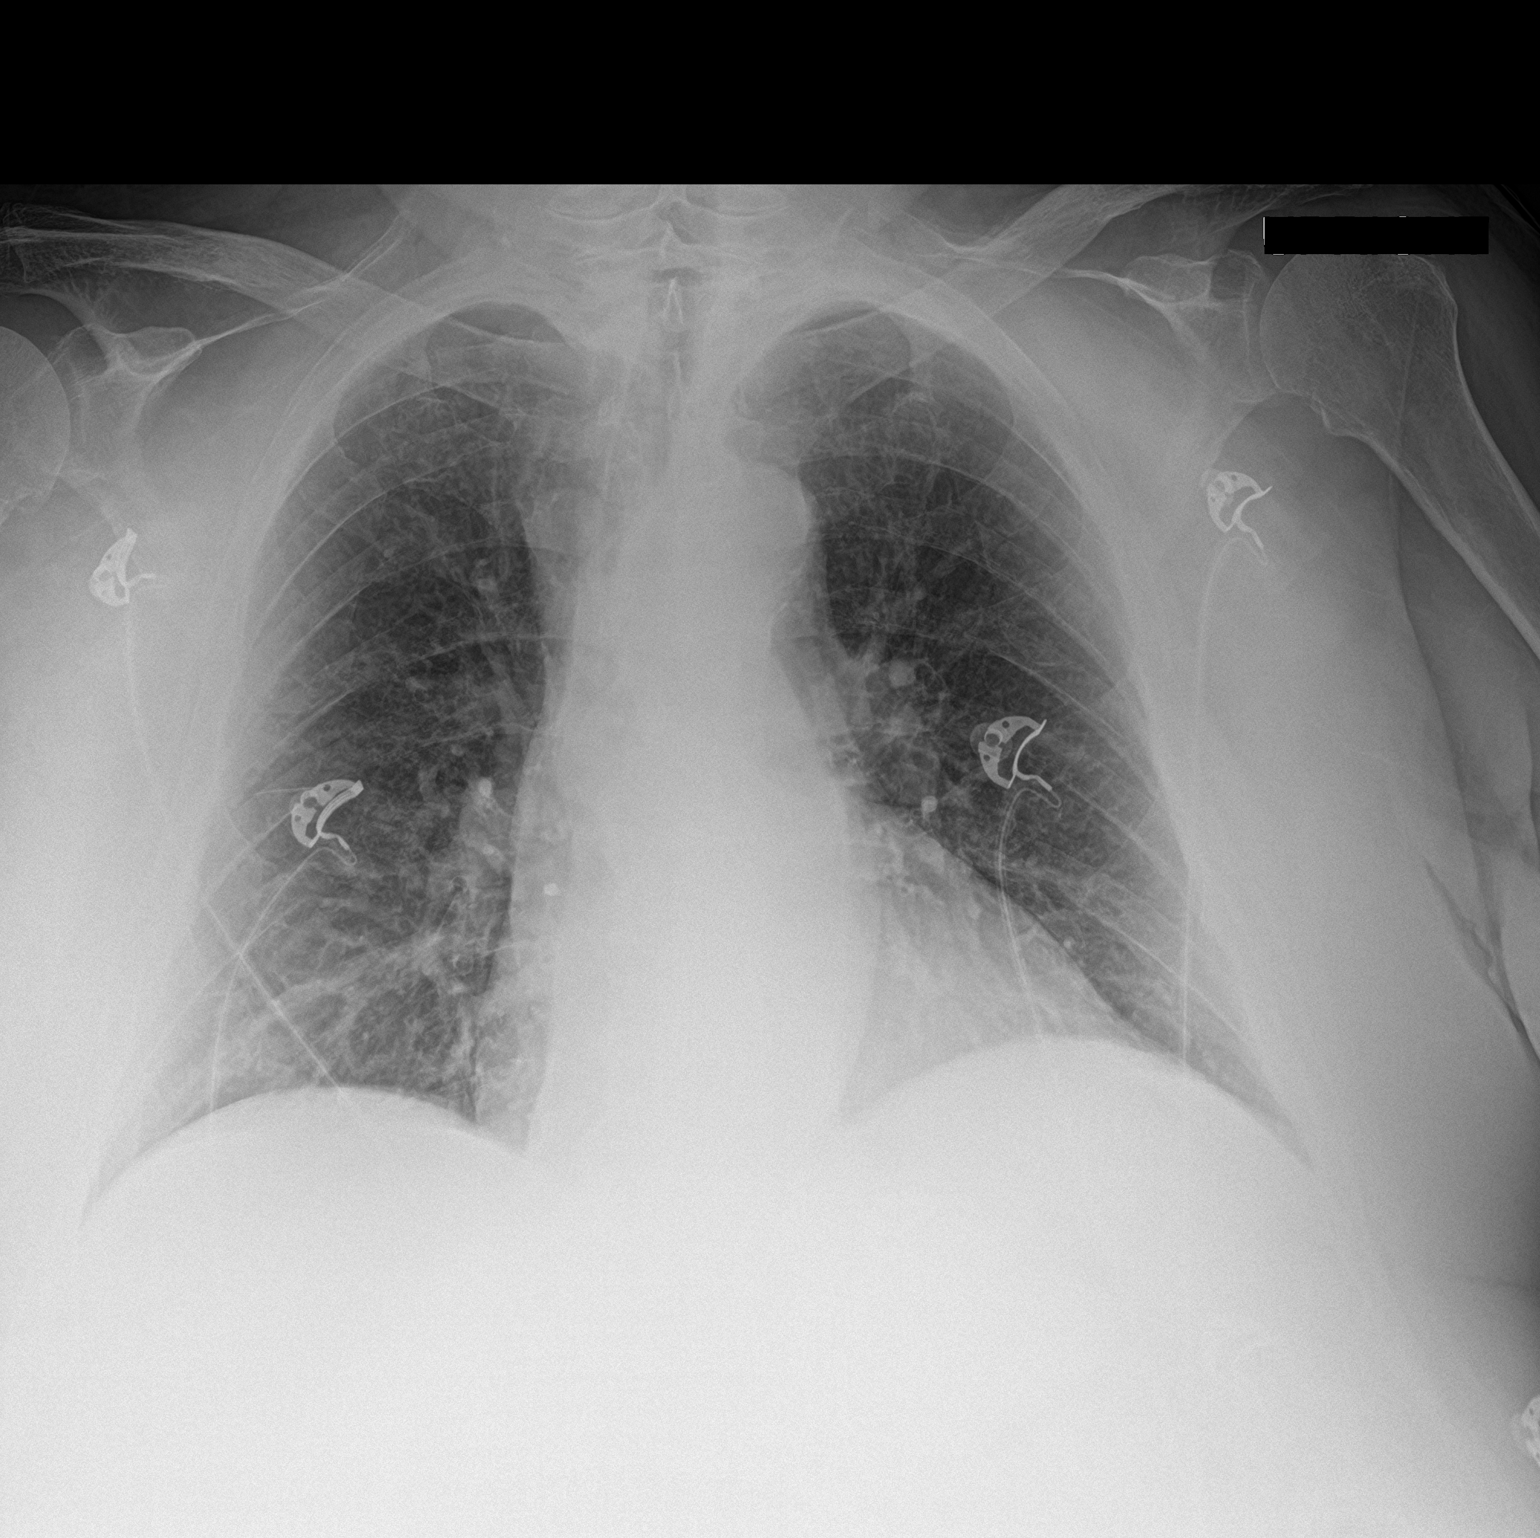

[2 of 2 positions shown; findings below may reference images not displayed]

FINDINGS: The heart size and mediastinal contours are within normal limits.
Both lungs are clear. The visualized skeletal structures are
unremarkable.
IMPRESSION: No active cardiopulmonary disease.

## 2022-06-14 ENCOUNTER — Other Ambulatory Visit: Payer: Self-pay | Admitting: Nurse Practitioner

## 2022-06-14 DIAGNOSIS — K219 Gastro-esophageal reflux disease without esophagitis: Secondary | ICD-10-CM

## 2022-06-14 MED ORDER — OMEPRAZOLE 40 MG PO CPDR: DELAYED_RELEASE_CAPSULE | ORAL | 1 refills | Status: DC

## 2022-06-16 ENCOUNTER — Ambulatory Visit (INDEPENDENT_AMBULATORY_CARE_PROVIDER_SITE_OTHER): Payer: Medicare PPO | Admitting: Nurse Practitioner

## 2022-06-16 ENCOUNTER — Encounter: Payer: Self-pay | Admitting: Nurse Practitioner

## 2022-06-16 VITALS — BP 132/89 | HR 71 | Temp 98.3°F | Resp 16 | Ht 71.0 in | Wt 227.4 lb

## 2022-06-16 DIAGNOSIS — E1122 Type 2 diabetes mellitus with diabetic chronic kidney disease: Secondary | ICD-10-CM

## 2022-06-16 DIAGNOSIS — M19039 Primary osteoarthritis, unspecified wrist: Secondary | ICD-10-CM

## 2022-06-16 DIAGNOSIS — M19049 Primary osteoarthritis, unspecified hand: Secondary | ICD-10-CM

## 2022-06-16 DIAGNOSIS — I10 Essential (primary) hypertension: Secondary | ICD-10-CM | POA: Diagnosis not present

## 2022-06-16 DIAGNOSIS — N184 Chronic kidney disease, stage 4 (severe): Secondary | ICD-10-CM | POA: Diagnosis not present

## 2022-06-16 LAB — POCT GLYCOSYLATED HEMOGLOBIN (HGB A1C): Hemoglobin A1C: 5.9 % — AB (ref 4.0–5.6)

## 2022-06-16 MED ORDER — METHOCARBAMOL 500 MG PO TABS: 500.0000 mg | ORAL_TABLET | Freq: Four times a day (QID) | ORAL | 2 refills | Status: DC | PRN

## 2022-06-16 MED ORDER — TRAMADOL HCL 50 MG PO TABS: 50.0000 mg | ORAL_TABLET | Freq: Four times a day (QID) | ORAL | 0 refills | Status: AC | PRN

## 2022-06-16 NOTE — Progress Notes (Signed)
[facility] [place], [place] [address]  Internal MEDICINE  Office Visit Note  Patient Name: Larry Callahan  [id number]  [id number]  Date of Service: 06/16/2022  Chief Complaint  Patient presents with   Follow-up    Bilateral hand issues     Diabetes   Gastroesophageal Reflux   Hyperlipidemia   Hypertension    HPI [place] presents for a follow up visit for diabetes and hand and wrist pain Diabetes -- A1c 5.9 -- takes toujeo and glimepiride Bilateral hand/wrist pain and swelling -- wants something for pain and swelling Right shoulder pain -- arthritis  Right knee pain - most likely arthritic  Hypertension -- BP is controlled.     Current Medication: Outpatient Encounter Medications as of 06/16/2022  Medication Sig   Accu-Chek Softclix Lancets lancets Use to check blood sugars 4 times a day   E11.65   apixaban (ELIQUIS) 5 MG TABS tablet Take 1 tablet (5 mg total) by mouth 2 (two) times daily. Must make appt with Dr. Rockey Situ for refills   Blood Glucose Monitoring Suppl (TRUE METRIX AIR GLUCOSE METER) DEVI 1 Device by Does not apply route 2 (two) times daily.   Budeson-Glycopyrrol-Formoterol (BREZTRI AEROSPHERE) 160-9-4.8 MCG/ACT AERO Inhale 2 puffs into the lungs 2 (two) times daily.   carvedilol (COREG) 12.5 MG tablet Take 1 tablet (12.5 mg total) by mouth 2 (two) times daily.   cyanocobalamin 1000 MCG tablet Take 1 tablet (1,000 mcg total) by mouth daily.   furosemide (LASIX) 40 MG tablet Take 1 tablet (40 mg total) by mouth daily. Take lasix 15m po daily   glimepiride (AMARYL) 2 MG tablet Take 1 tablet (2 mg total) by mouth daily.   glucose blood (ACCU-CHEK GUIDE) test strip Use to check blood sugars 4 times a day   E11.65   hydrALAZINE (APRESOLINE) 100 MG tablet Take 1 tablet (100 mg total) by mouth 3 (three) times daily.   insulin glargine, 2 Unit Dial, (TOUJEO MAX SOLOSTAR) 300 UNIT/ML Solostar Pen Inject 20 units in am   Insulin Pen Needle  (NOVOFINE PEN NEEDLE) 32G X 6 MM MISC Use as directed with toujeo pen   ipratropium-albuterol (DUONEB) 0.5-2.5 (3) MG/3ML SOLN Take 3 mLs by nebulization every 6 (six) hours as needed.   methocarbamol (ROBAXIN) 500 MG tablet Take 1 tablet (500 mg total) by mouth every 6 (six) hours as needed for muscle spasms.   omeprazole (PRILOSEC) 40 MG capsule Take 1 capsule once in the morning and once in the evening   potassium chloride (KLOR-CON) 20 MEQ packet Take 20 mEq by mouth daily.   rosuvastatin (CRESTOR) 10 MG tablet Take 1 tablet (10 mg total) by mouth at bedtime.   TOUJEO SOLOSTAR 300 UNIT/ML Solostar Pen Inject 26 Units into the skin daily.   traMADol (ULTRAM) 50 MG tablet Take 1 tablet (50 mg total) by mouth every 6 (six) hours as needed for up to 5 days.   [DISCONTINUED] HYDROcodone-acetaminophen (NORCO/VICODIN) 5-325 MG tablet Take 1 tablet by mouth every 6 (six) hours as needed for severe pain.   No facility-administered encounter medications on file as of 06/16/2022.    Surgical History: Past Surgical History:  Procedure Laterality Date   BACK SURGERY     CARDIAC CATHETERIZATION     CHOLECYSTECTOMY     COLONOSCOPY WITH PROPOFOL N/A 04/12/2019   Procedure: COLONOSCOPY WITH PROPOFOL;  Surgeon: VLin Landsman MD;  [facility]  Service: Gastroenterology;  Laterality: N/A;   ESOPHAGOGASTRODUODENOSCOPY  N/A 04/12/2019   Procedure: ESOPHAGOGASTRODUODENOSCOPY (EGD);  Surgeon: [name], MD;  Location: [facility] ENDOSCOPY;  Service: Gastroenterology;  Laterality: N/A;   ESOPHAGOGASTRODUODENOSCOPY (EGD) WITH PROPOFOL N/A 11/21/2019   Procedure: ESOPHAGOGASTRODUODENOSCOPY (EGD) WITH PROPOFOL;  Surgeon: [name], MD;  Location: [facility] ENDOSCOPY;  Service: Gastroenterology;  Laterality: N/A;   ESOPHAGOGASTRODUODENOSCOPY (EGD) WITH PROPOFOL N/A 01/11/2022   Procedure: ESOPHAGOGASTRODUODENOSCOPY (EGD) WITH PROPOFOL;  Surgeon: [name], MD;  Location: [name] ENDOSCOPY;   Service: Endoscopy;  Laterality: N/A;   FLEXIBLE BRONCHOSCOPY Bilateral 05/17/2019   Procedure: FLEXIBLE BRONCHOSCOPY;  Surgeon: [name], MD;  Location: [name] ORS;  Service: Pulmonary;  Laterality: Bilateral;   left arm surgery     nephrectomy Left    PARATHYROIDECTOMY     RIGHT HEART CATH N/A 03/29/2019   Procedure: RIGHT HEART CATH;  Surgeon: [name], MD;  Location: [place] [facility] LAB;  Service: Cardiovascular;  Laterality: N/A;    Medical History: Past Medical History:  Diagnosis Date   Allergy    Atrial flutter ([place])    a. Dx 12/2020--CHA2DS2VASc = 5-->eliquis.   Chronic combined systolic (congestive) and diastolic (congestive) heart failure ([place])    a. 04/2019 Echo: EF 45-50%; b. 02/2019 Echo: EF 55-60%; c. 09/2020 Echo: EF 35-40%, glob HK. Nl RV size/fxn. Mild MR; d. 07/2021 Echo: EF 40-45%, mild LVH, low-nl RV fxn, no MR.   CKD (chronic kidney disease), stage III (HCC)    COPD (chronic obstructive pulmonary disease) ([place])    Coronary artery disease    a. 2011 Cath: nonobs dzs; b. 09/2020 MV: EF 38%. No signif ischemia. ? inf MI vs diaph attenuation. GI uptake artifact.   Diabetes mellitus without complication (HCC)    GERD (gastroesophageal reflux disease)    History of CVA (cerebrovascular accident) 09/25/2020   Hyperlipidemia    Hypertension    Morbid (severe) obesity due to excess calories ([place]) 06/09/2017   Morbid obesity ([place])    NICM (nonischemic cardiomyopathy) ([place])    a. 04/2019 Echo: EF 45-50%; b. 02/2019 Echo: EF 55-60%; c. 09/2020 Echo: EF 35-40%, glob HK; d. 09/2020 MV: No ischemia. ? inf infarct vs attenuation; d. 07/2021 Echo: EF 40-45%.   OSA (obstructive sleep apnea)    Pneumonia due to COVID-19 virus 01/30/2020   Stage 3b chronic kidney disease ([place])    Syncope and collapse 11/16/2020    Family History: Family History  Problem Relation Age of Onset   Diabetes Mother        2003   Lung cancer Father    Diabetes Sister    Hypertension Sister     Heart disease Sister    Cancer Sister    Bone cancer Brother    Prostate cancer Neg Hx    Bladder Cancer Neg Hx    Kidney cancer Neg Hx     Social History   Socioeconomic History   Marital status: Single    Spouse name: Not on file   Number of children: Not on file   Years of education: Not on file   Highest education level: Not on file  Occupational History   Not on file  Tobacco Use   Smoking status: Former    Types: Cigarettes   Smokeless tobacco: Former    Types: Nurse, children's Use: Never used  Substance and Sexual Activity   Alcohol use: No   Drug use: No   Sexual activity: Not Currently  Other Topics Concern   Not on  file  Social History Narrative   Not on file   Social Determinants of Health   Financial Resource Strain: Low Risk  (05/12/2021)   Overall Financial Resource Strain (CARDIA)    Difficulty of Paying Living Expenses: Not very hard  Food Insecurity: Not on file  Transportation Needs: Not on file  Physical Activity: Not on file  Stress: Not on file  Social Connections: Not on file  Intimate Partner Violence: Not on file      Review of Systems  Constitutional:  Negative for fatigue and fever.  HENT:  Negative for congestion, mouth sores and postnasal drip.   Respiratory:  Negative for cough.   Cardiovascular:  Negative for chest pain.  Genitourinary:  Negative for flank pain.  Psychiatric/Behavioral: Negative.      Vital Signs: BP 132/89   Pulse 71   Temp 98.3 F (36.8 C)   Resp 16   Ht _0  (1.803 m)   Wt 227 lb 6.4 oz (103.1 kg)   SpO2 98%   BMI 31.72 kg/m    Physical Exam Vitals reviewed.  Constitutional:      General: He is not in acute distress.    Appearance: Normal appearance. He is obese. He is not ill-appearing.  HENT:     Head: Normocephalic and atraumatic.  Eyes:     Pupils: Pupils are equal, round, and reactive to light.  Cardiovascular:     Rate and Rhythm: Normal rate and regular rhythm.   Pulmonary:     Effort: Pulmonary effort is normal. No respiratory distress.  Musculoskeletal:     Right shoulder: Decreased range of motion.     Right wrist: Swelling present. Decreased range of motion.     Left wrist: Swelling present. Decreased range of motion.     Right hand: Swelling present. Decreased range of motion.     Left hand: Swelling present. Decreased range of motion.  Neurological:     Mental Status: He is alert and oriented to person, place, and time.  Psychiatric:        Mood and Affect: Mood normal.        Behavior: Behavior normal.        Assessment/Plan: 1. Diabetes mellitus with stage 4 chronic kidney disease (HCC) A1c is stable at 5.9, continue current medications as prescribed, no changes.  - POCT glycosylated hemoglobin (Hb A1C)  2. Benign hypertension Stable, continue medications as prescribed  3. Joint inflammation of hand and wrist Tramadol and muscle relaxant prescribed as needed. Referred to orthopedic for further evaluation - traMADol (ULTRAM) 50 MG tablet; Take 1 tablet (50 mg total) by mouth every 6 (six) hours as needed for up to 5 days.  Dispense: 20 tablet; Refill: 0 - methocarbamol (ROBAXIN) 500 MG tablet; Take 1 tablet (500 mg total) by mouth every 6 (six) hours as needed for muscle spasms.  Dispense: 60 tablet; Refill: 2 - Ambulatory referral to Orthopedic Surgery   General Counseling: royden bulman understanding of the findings of todays visit and agrees with plan of treatment. I have discussed any further diagnostic evaluation that may be needed or ordered today. We also reviewed his medications today. he has been encouraged to call the office with any questions or concerns that should arise related to todays visit.    Orders Placed This Encounter  Procedures   Ambulatory referral to Orthopedic Surgery   POCT glycosylated hemoglobin (Hb A1C)    Meds ordered this encounter  Medications   traMADol (ULTRAM) 50 MG  tablet    Sig:  Take 1 tablet (50 mg total) by mouth every 6 (six) hours as needed for up to 5 days.    Dispense:  20 tablet    Refill:  0    Please fill today   methocarbamol (ROBAXIN) 500 MG tablet    Sig: Take 1 tablet (500 mg total) by mouth every 6 (six) hours as needed for muscle spasms.    Dispense:  60 tablet    Refill:  2    Return in about 3 months (around 09/21/2022) for F/U, Recheck A1C with DFK if possible.   Total time spent:30 Minutes Time spent includes review of chart, medications, test results, and follow up plan with the patient.   [place] Controlled Substance Database was reviewed by me.  This patient was seen by [name], FNP-C in collaboration with Dr. Clayborn Bigness as a part of collaborative care agreement.   Travelle Mcclimans R. Valetta Fuller, MSN, FNP-C Internal medicine

## 2022-06-22 ENCOUNTER — Telehealth: Payer: Self-pay | Admitting: Nurse Practitioner

## 2022-06-22 NOTE — Telephone Encounter (Signed)
Orthopedic Surgery referral sent via [facility] to [facility]. -[name] 

## 2022-06-24 NOTE — Addendum Note (Signed)
Addended by: Tiffany Kocher V on: 06/24/2022 03:00 PM   Modules accepted: Orders

## 2022-06-28 ENCOUNTER — Telehealth: Payer: Self-pay | Admitting: Nurse Practitioner

## 2022-06-28 ENCOUNTER — Ambulatory Visit: Payer: Medicare PPO | Admitting: Internal Medicine

## 2022-06-28 NOTE — Telephone Encounter (Signed)
Per Rudene Christians, referral has been closed due to patient not returning their calls to schedule appointment-[name]

## 2022-06-29 IMAGING — CT CT HEAD W/O CM
3 series · 15 of 47 positions shown, 18 images · non-contrast
Comparison: Head CT 09/25/2020

CLINICAL DATA: Head trauma. Near syncopal episode. Headache and
neck pain.

EXAM:
CT HEAD WITHOUT CONTRAST
TECHNIQUE: Contiguous axial images were obtained from the base of the skull
through the vertex without intravenous contrast.

[Series 3: head wo · axial · 0.45mm/px · z∈[-146,-11]mm · 9 of 33 slices shown, 12 images]
[im 3/33  brain]
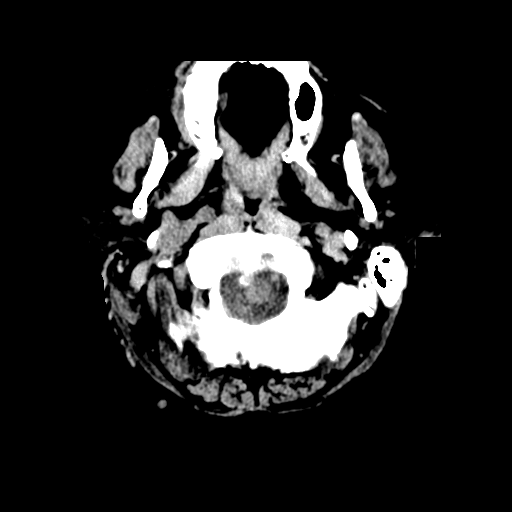
[im 3/33  bone]
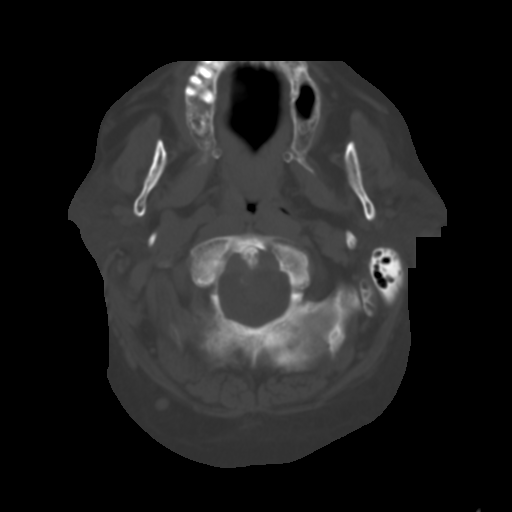
[im 6/33  brain]
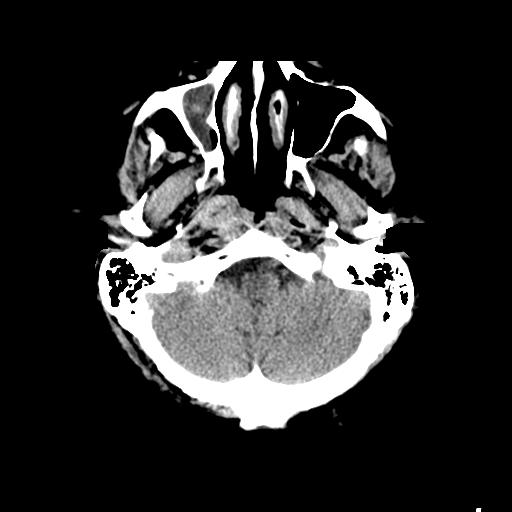
[im 9/33  brain]
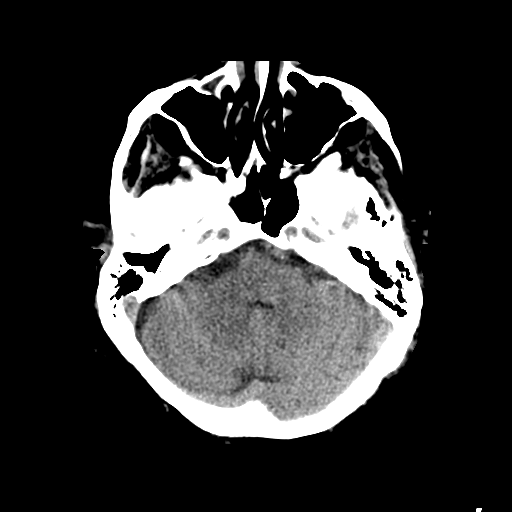
[im 13/33  brain]
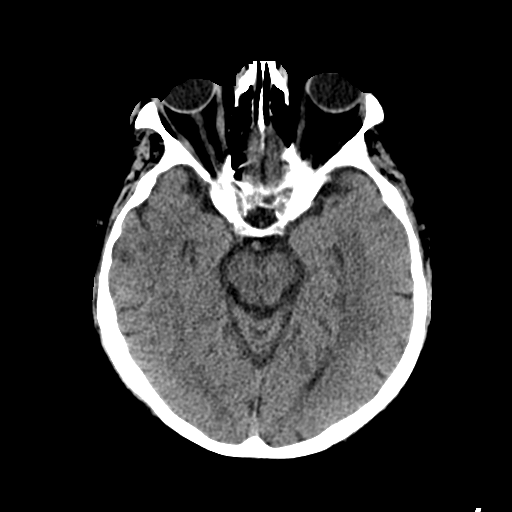
[im 17/33  brain]
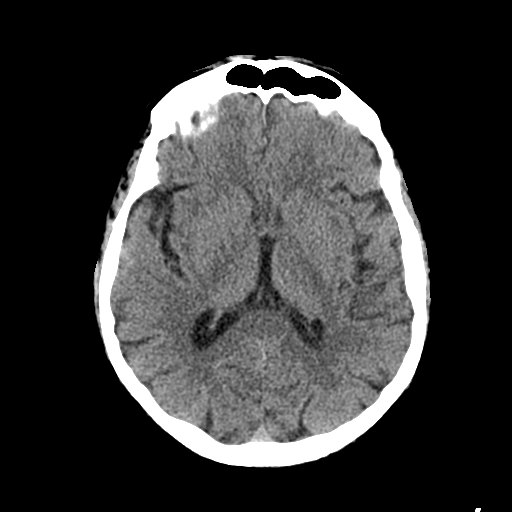
[im 17/33  bone]
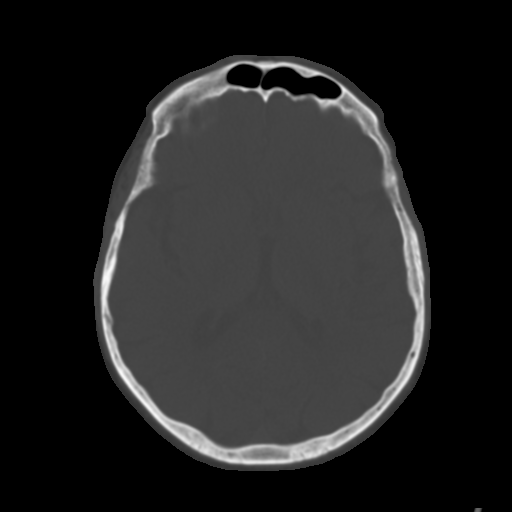
[im 20/33  brain]
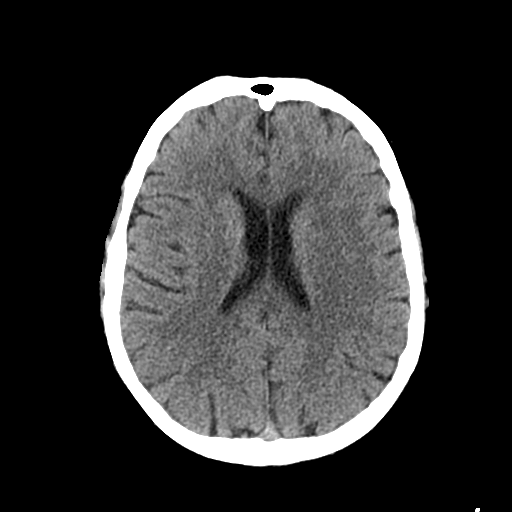
[im 24/33  brain]
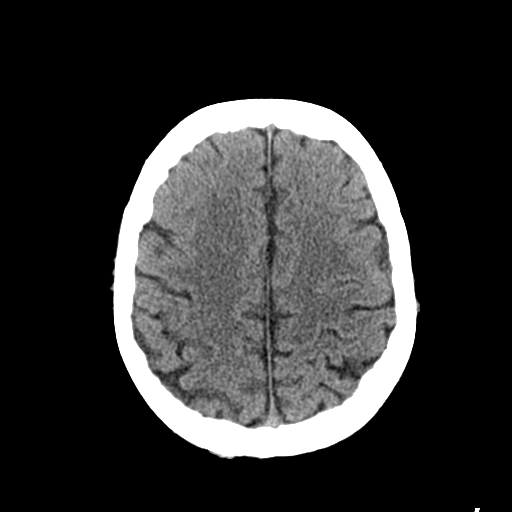
[im 27/33  brain]
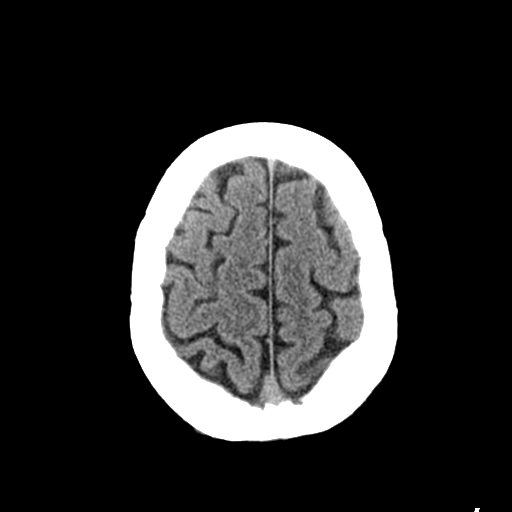
[im 30/33  brain]
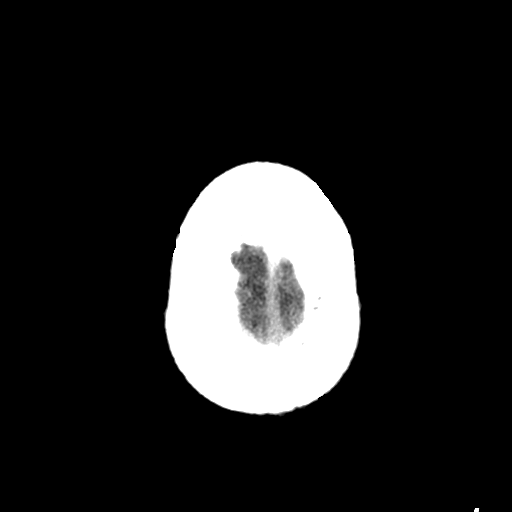
[im 30/33  bone]
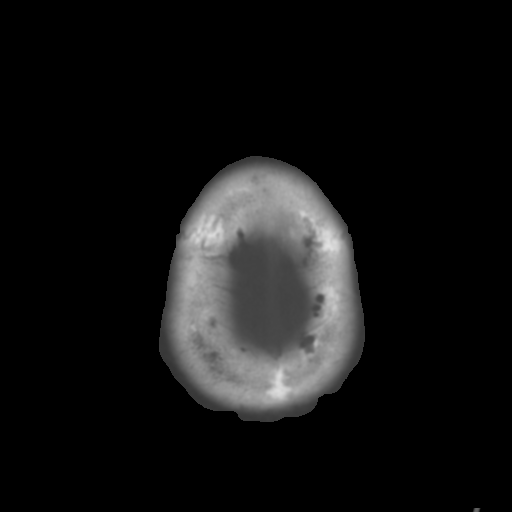

[Series 4: coronal soft tissue · coronal · 0.36mm/px · 3 of 76 slices shown]
[im 26/76  brain]
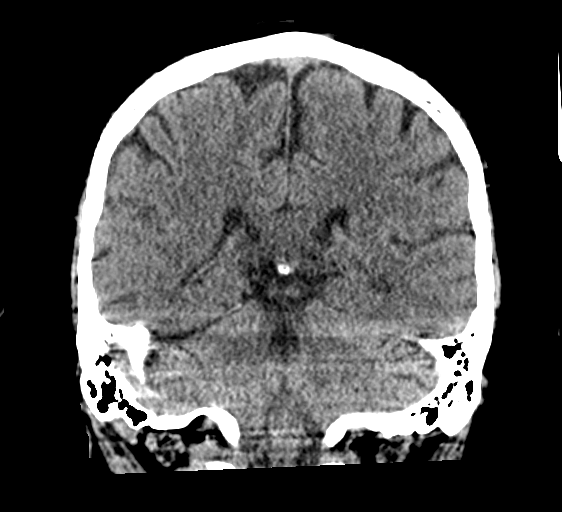
[im 34/76  brain]
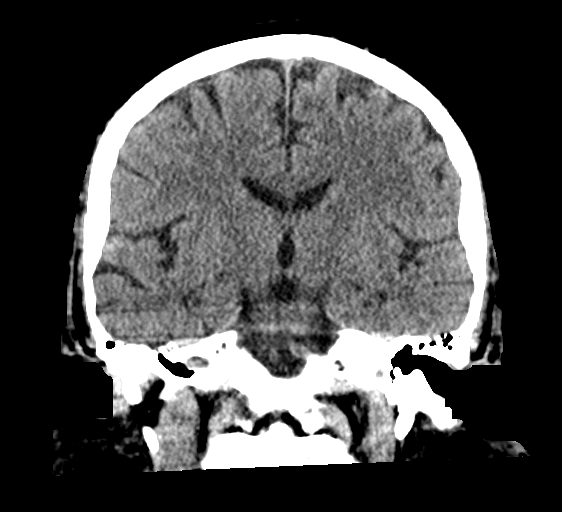
[im 42/76  brain]
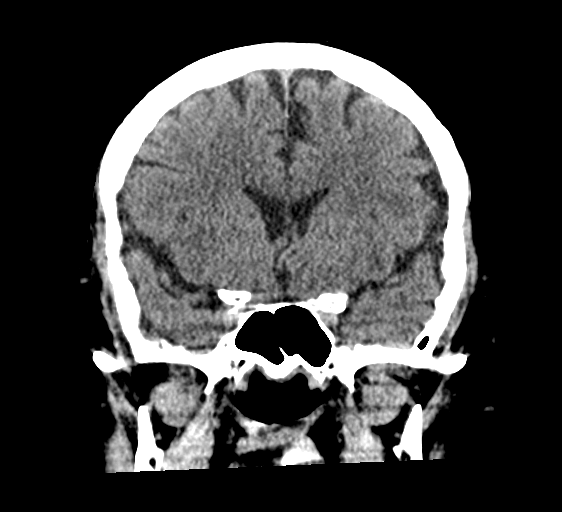

[Series 5: sagittal soft tissue · sagittal · 0.36mm/px · 3 of 57 slices shown]
[im 19/57  brain]
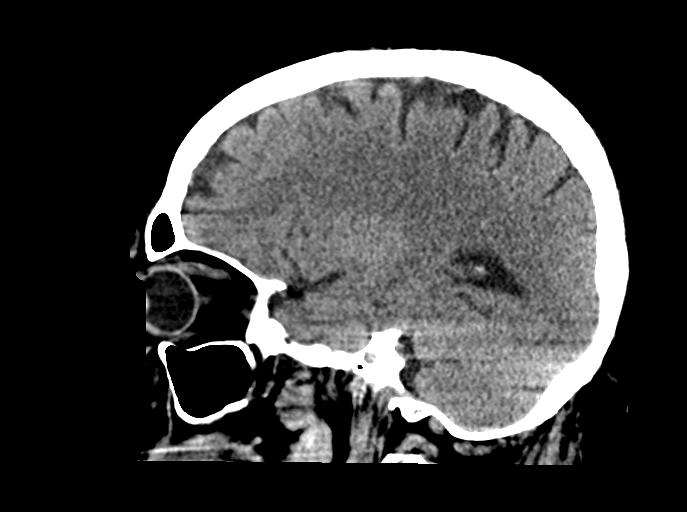
[im 29/57  brain]
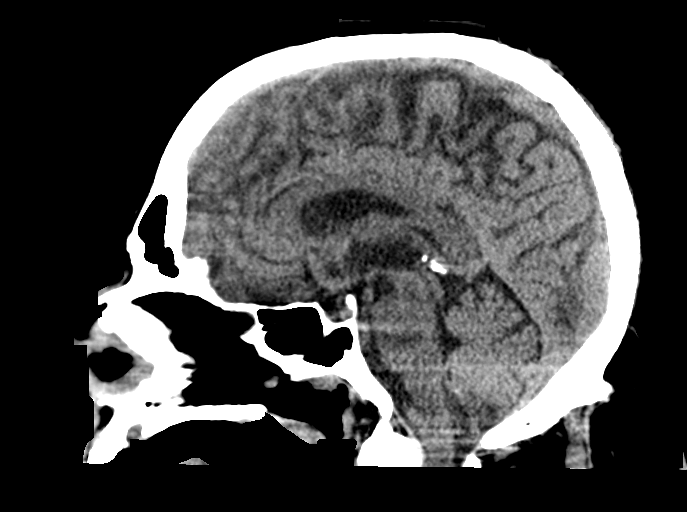
[im 38/57  brain]
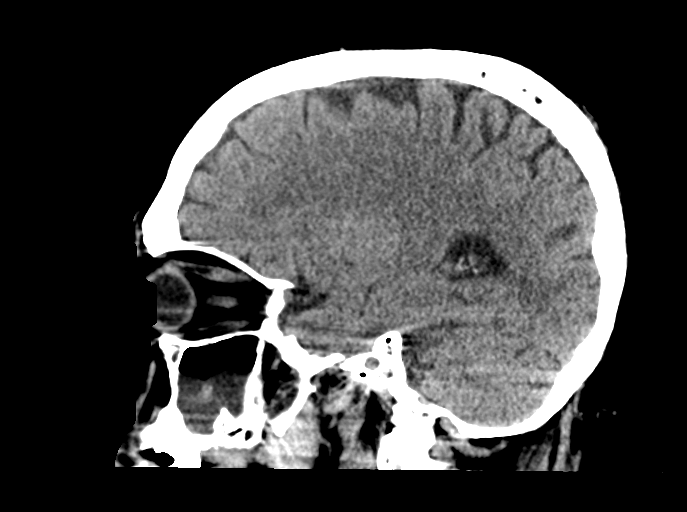

[15 of 47 positions shown; findings below may reference images not displayed]

FINDINGS: Brain: No intracranial hemorrhage, mass effect, or midline shift. No
hydrocephalus. The basilar cisterns are patent. No evidence of
territorial infarct or acute ischemia. No extra-axial or
intracranial fluid collection.

Vascular: No hyperdense vessel.

Skull: No fracture or focal lesion.

Sinuses/Orbits: Mucous retention cysts in the right maxillary sinus.
No acute findings.

Other: None.
IMPRESSION: No acute intracranial abnormality. No skull fracture.

## 2022-06-29 IMAGING — CT CT L SPINE W/O CM
3 series · 12 of 35 positions shown, 14 images · non-contrast
Comparison: Lumbar CT 06/17/2019

CLINICAL DATA: Low back pain, trauma

EXAM:
CT LUMBAR SPINE WITHOUT CONTRAST
TECHNIQUE: Multidetector CT imaging of the lumbar spine was performed without
intravenous contrast administration. Multiplanar CT image
reconstructions were also generated.

[Series 4: l spine soft · axial · 0.38mm/px · z∈[-723,-543]mm · 4 of 130 slices shown, 5 images]
[im 20/130  soft-tissue]
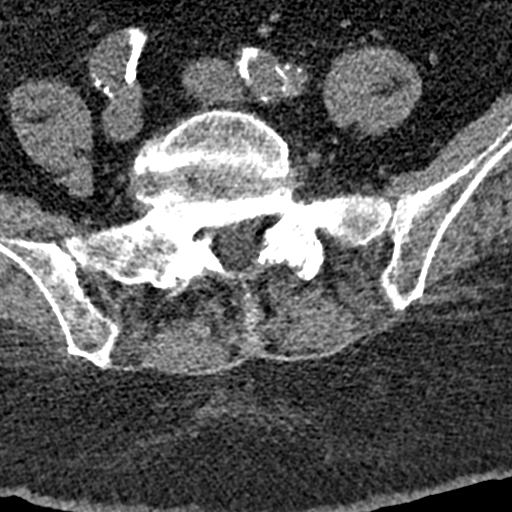
[im 20/130  bone]
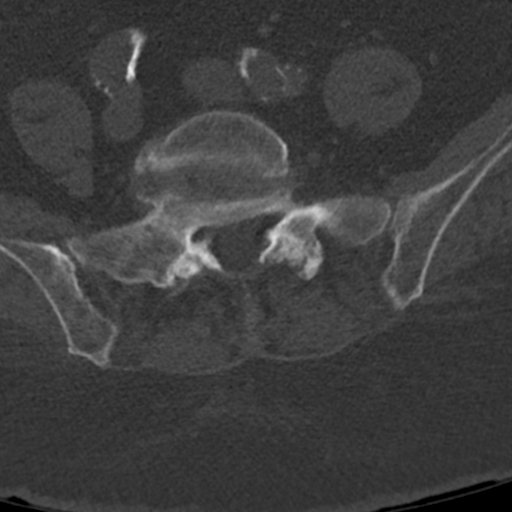
[im 50/130  bone]
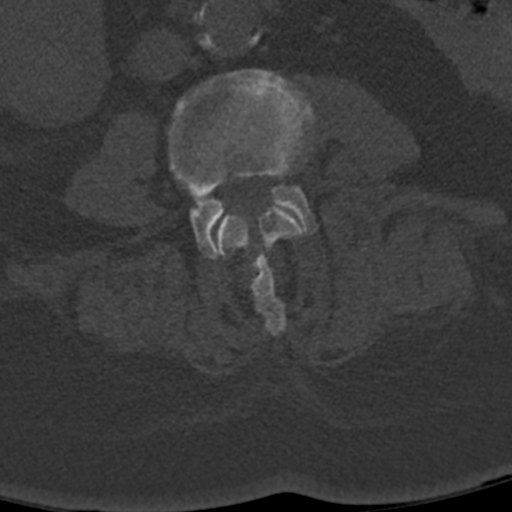
[im 80/130  bone]
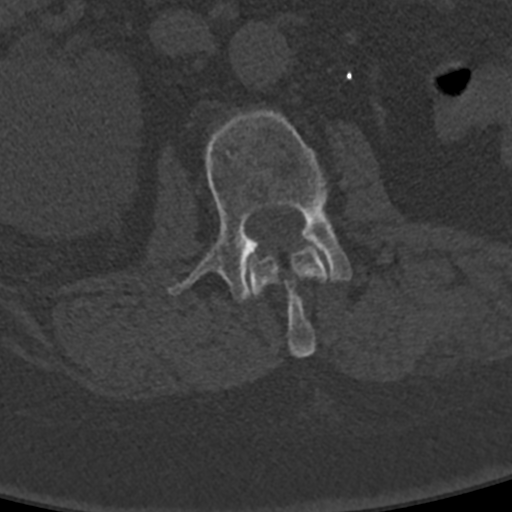
[im 110/130  bone]
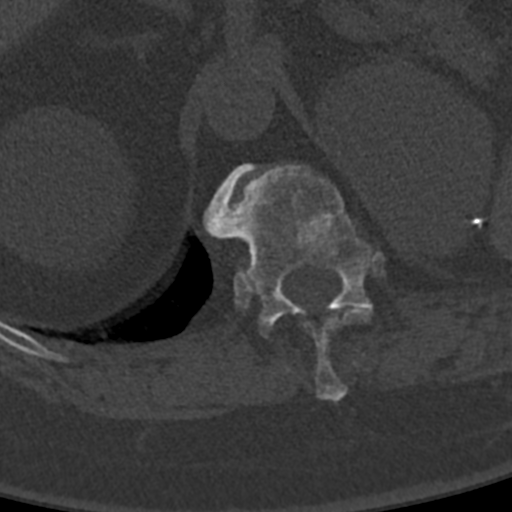

[Series 6: sagittal st · sagittal · 0.41mm/px · 5 of 82 slices shown, 6 images]
[im 28/82  bone]
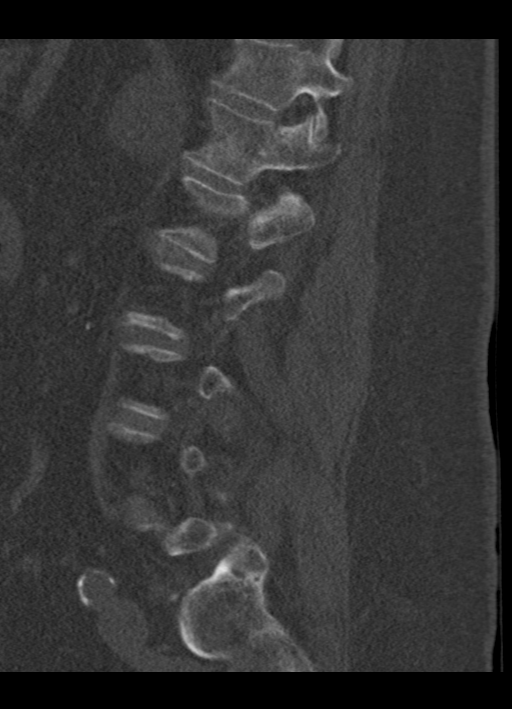
[im 34/82  bone]
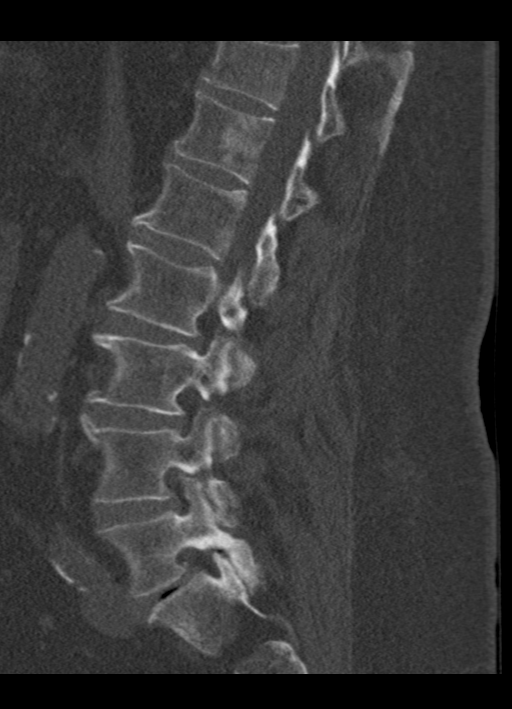
[im 41/82  soft-tissue]
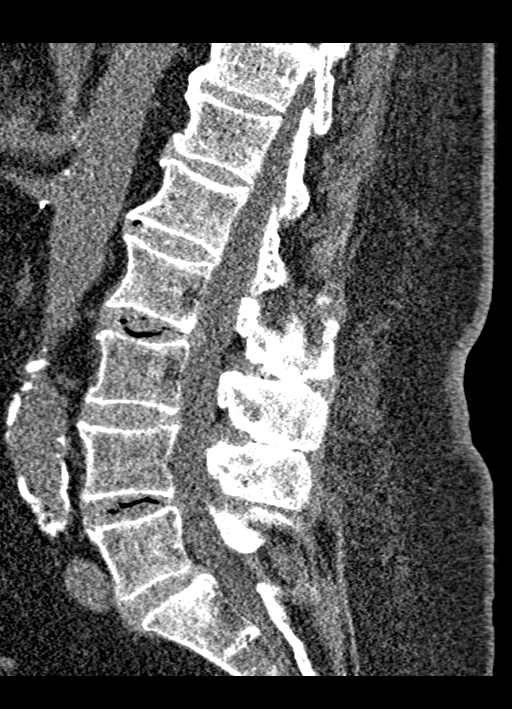
[im 41/82  bone]
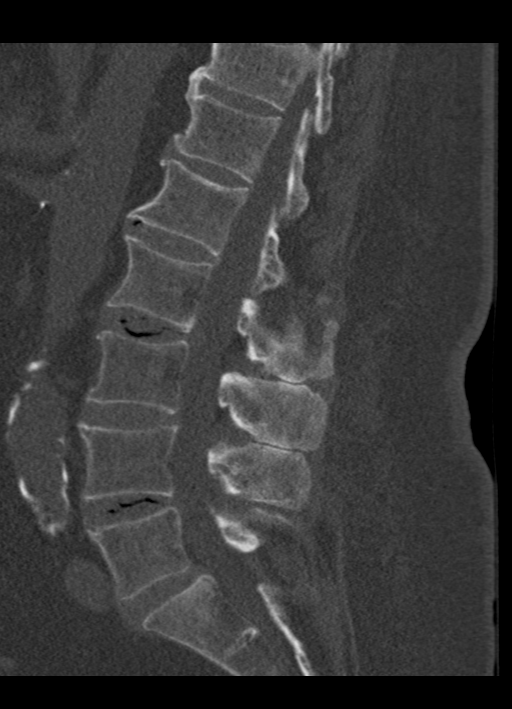
[im 48/82  bone]
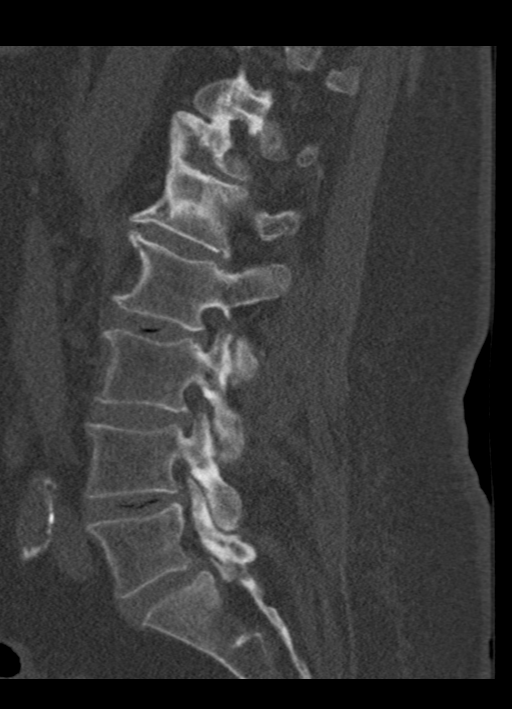
[im 55/82  bone]
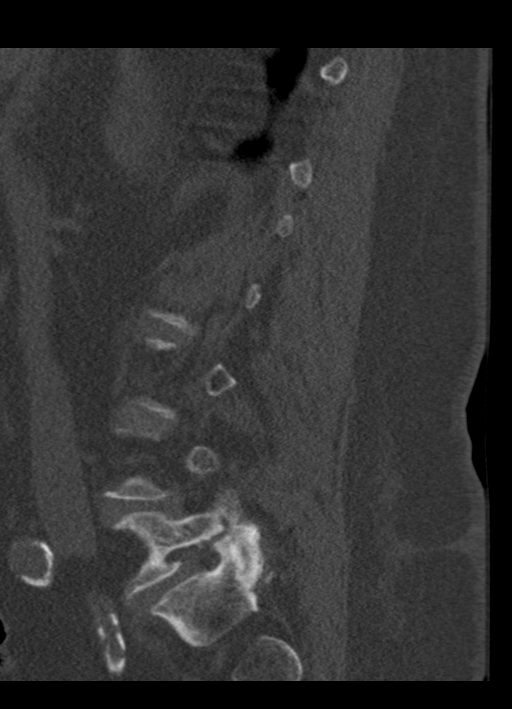

[Series 7: coronal bone · coronal · 0.32mm/px · 3 of 77 slices shown]
[im 16/77  bone]
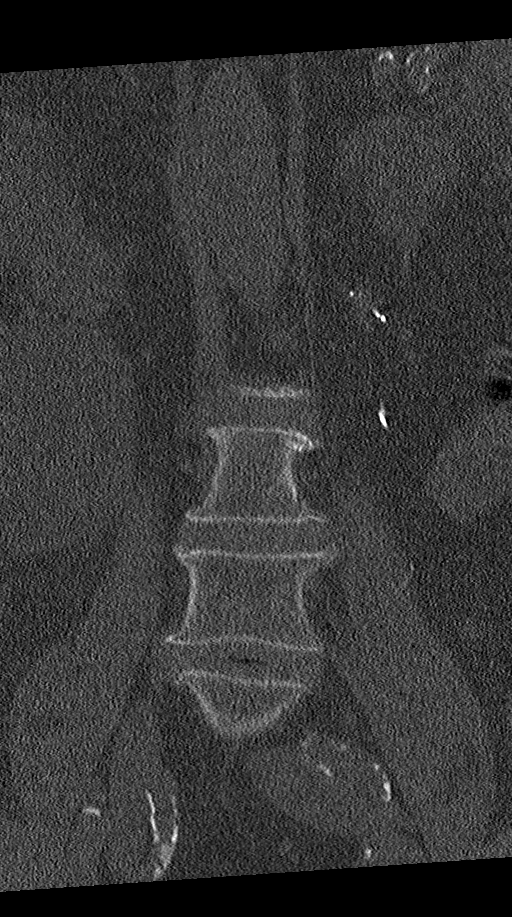
[im 31/77  bone]
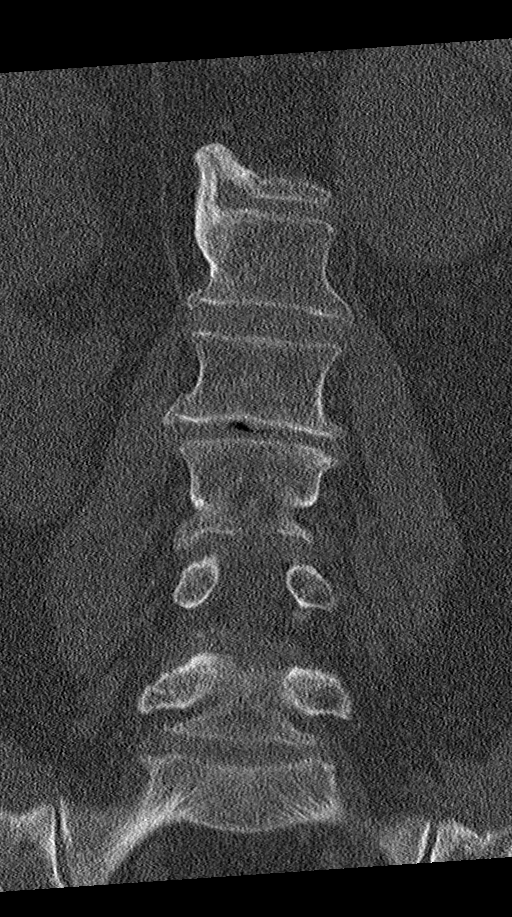
[im 46/77  bone]
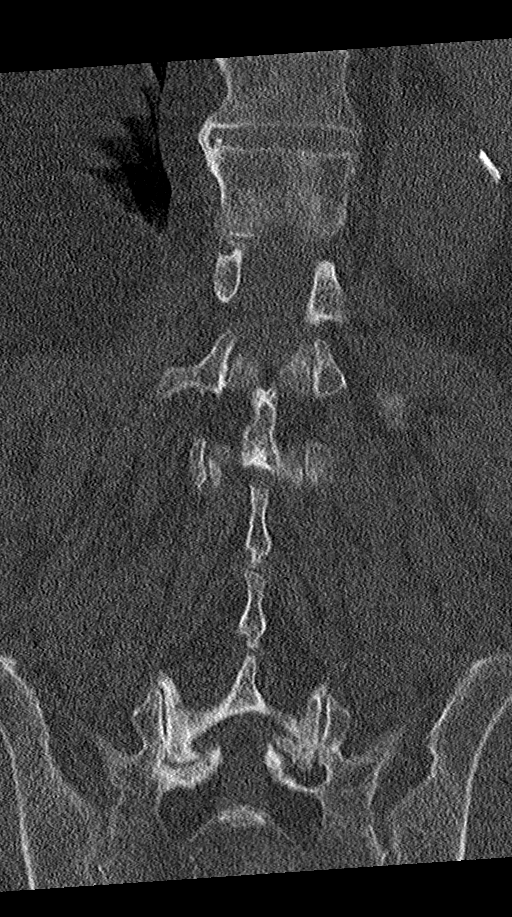

[12 of 35 positions shown; findings below may reference images not displayed]

FINDINGS: Segmentation: 5 lumbar type vertebrae.

Alignment: No traumatic subluxation. Trace anterolisthesis of L5 on
S1.

Vertebrae: No acute fracture or focal pathologic process.

Paraspinal and other soft tissues: Right renal cysts. Left
nephrectomy. Left adrenal cystic lesion which was fully included on
abdominopelvic CT 10/30/2020. No significant change since that time.
Aortic and branch atherosclerosis.

Disc levels: Increased vacuum phenomena at L2-L3 and L4-L5 from
prior CT. Mild diffuse disc bulge and facet hypertrophy, similar to
prior exam.
IMPRESSION: 1. No acute fracture or subluxation of the lumbar spine.
2. Increased vacuum phenomena at L2-L3 and L4-L5 from prior CT. Mild
diffuse disc bulge and facet hypertrophy, similar to prior exam.

Aortic Atherosclerosis ([id number]).

## 2022-06-29 IMAGING — CR DG HIP (WITH OR WITHOUT PELVIS) 2-3V*R*
3 series · 3 of 3 positions shown · non-contrast
Comparison: Femur radiograph 10/15/2020

CLINICAL DATA: Back pain radiating to the right hip

EXAM:
DG HIP (WITH OR WITHOUT PELVIS) 2-3V RIGHT

[pelvis ap]
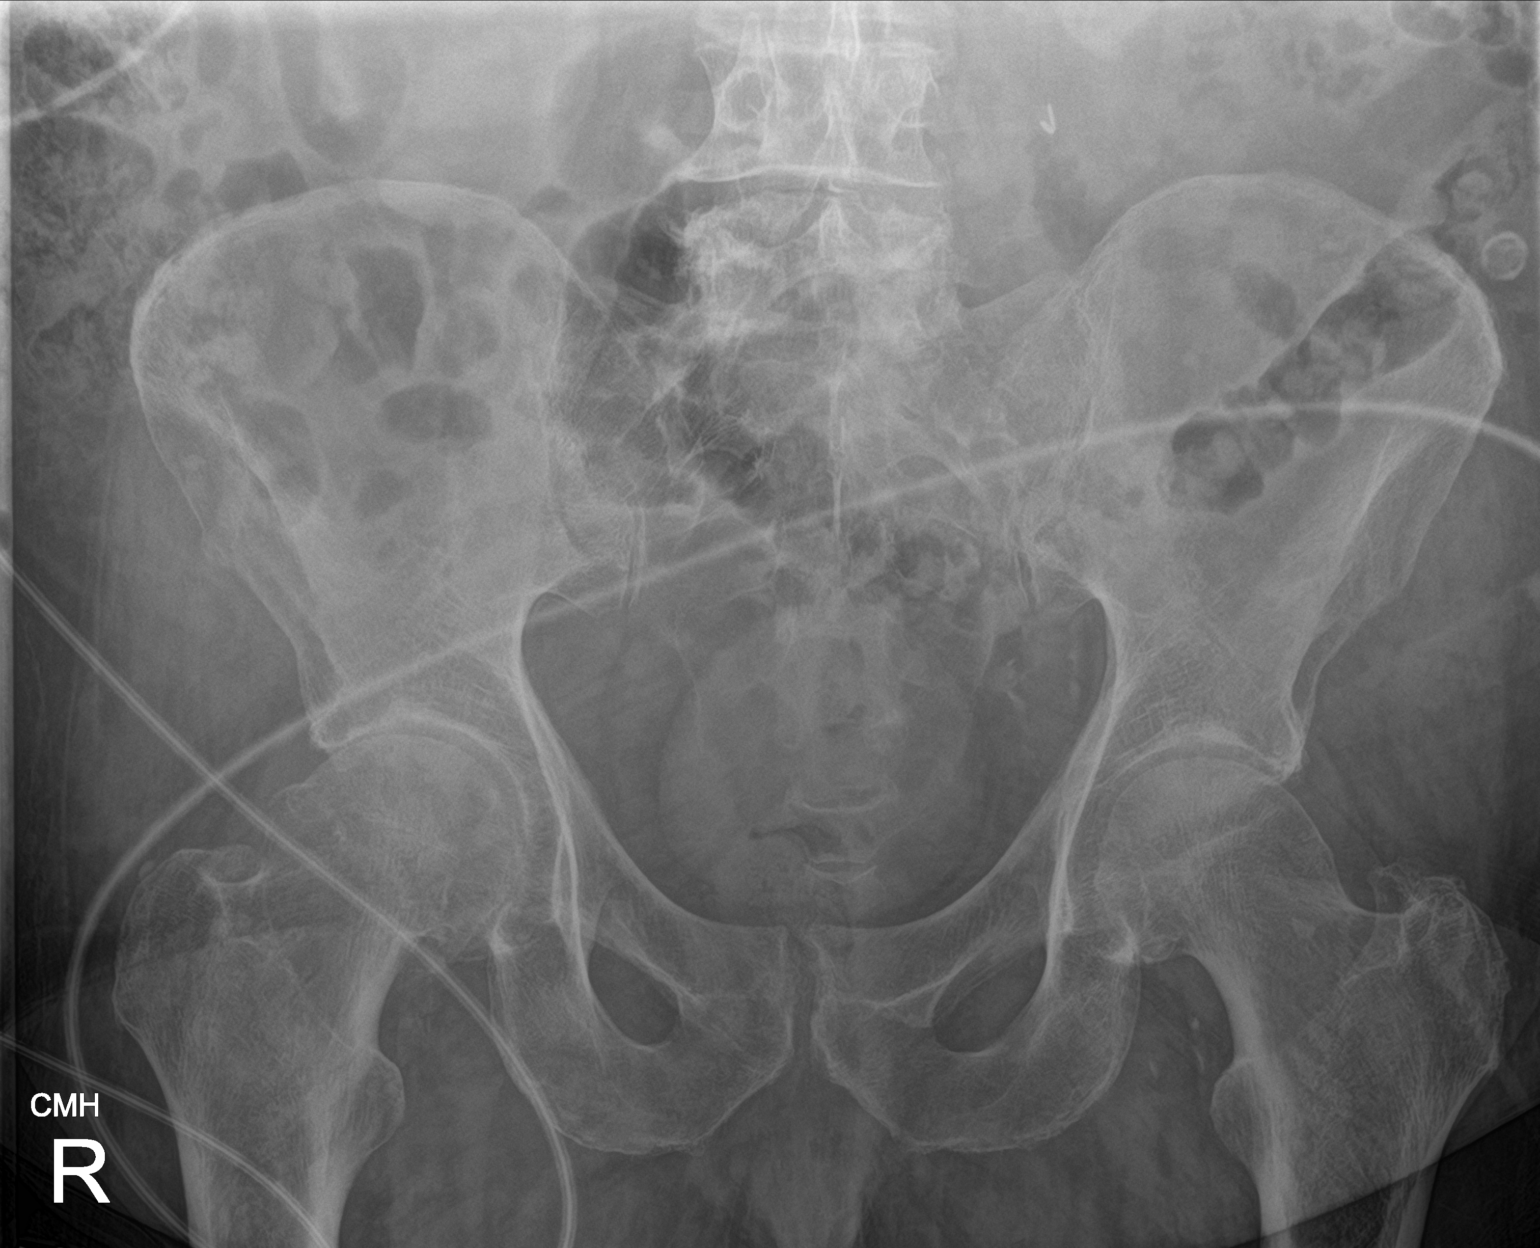

[hip ap]
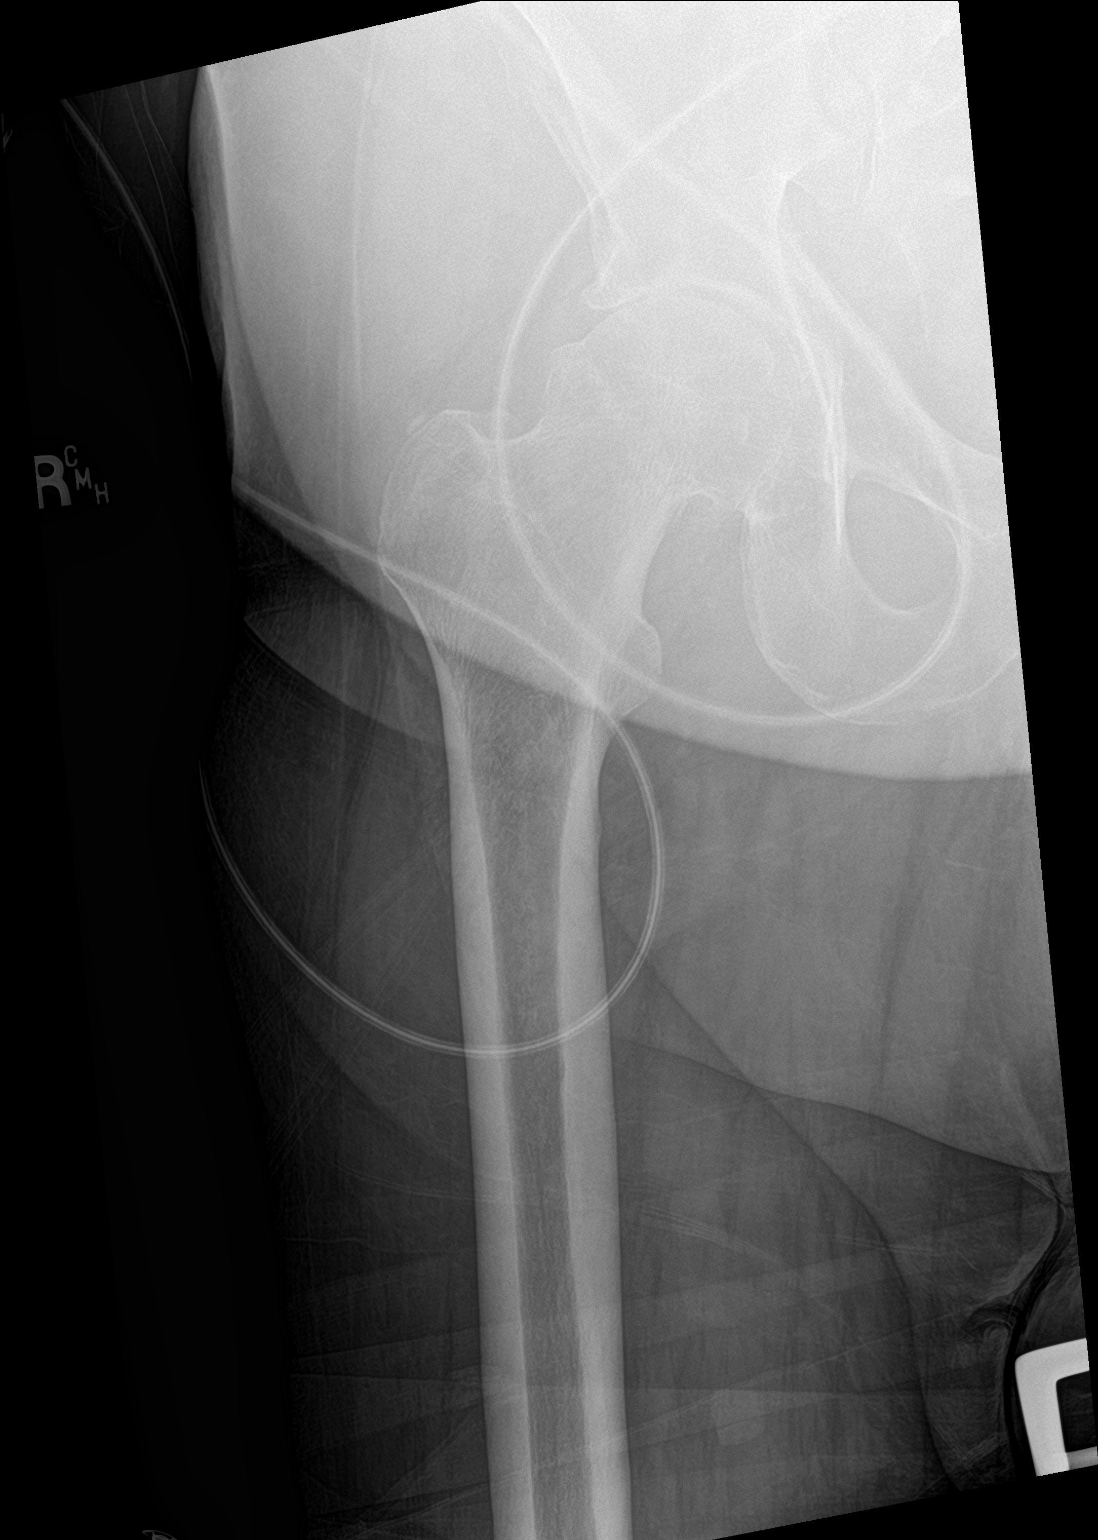

[hip lat]
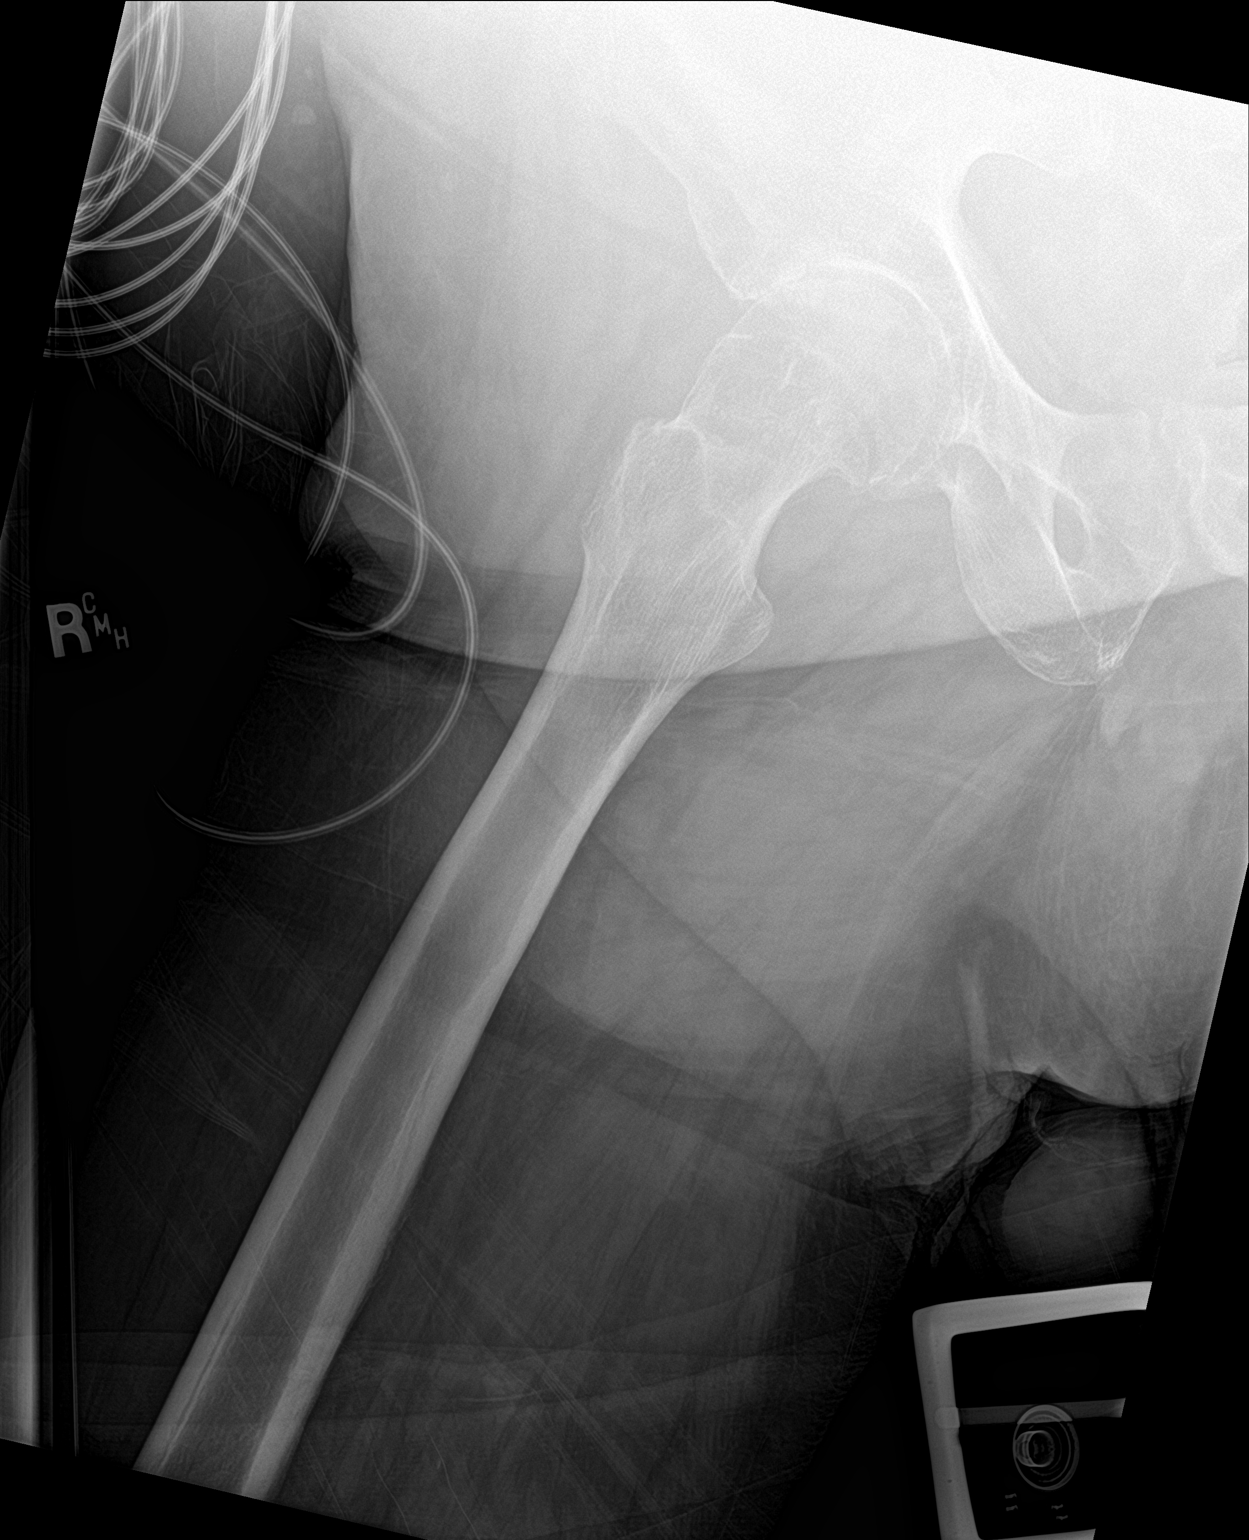

[3 of 3 positions shown; findings below may reference images not displayed]

FINDINGS: Right hip joint space narrowing. Acetabular and femoral head neck
osteophytes unchanged. No fracture. No evidence of a vascular
necrosis or focal bone lesion. The pubic rami and remainder of the
bony pelvis are intact. Pubic symphysis and sacroiliac joints are
congruent.
IMPRESSION: Right hip osteoarthritis.  No acute findings.

## 2022-06-29 IMAGING — CR DG CHEST 2V
1 series · 2 of 2 positions shown · non-contrast
Comparison: October 31, 2020

CLINICAL DATA: Syncopal episode.

EXAM:
CHEST - 2 VIEW

[Series 1: dg chest 2 view · 0.14mm/px · 2 of 2 slices shown]
[im 1/2]
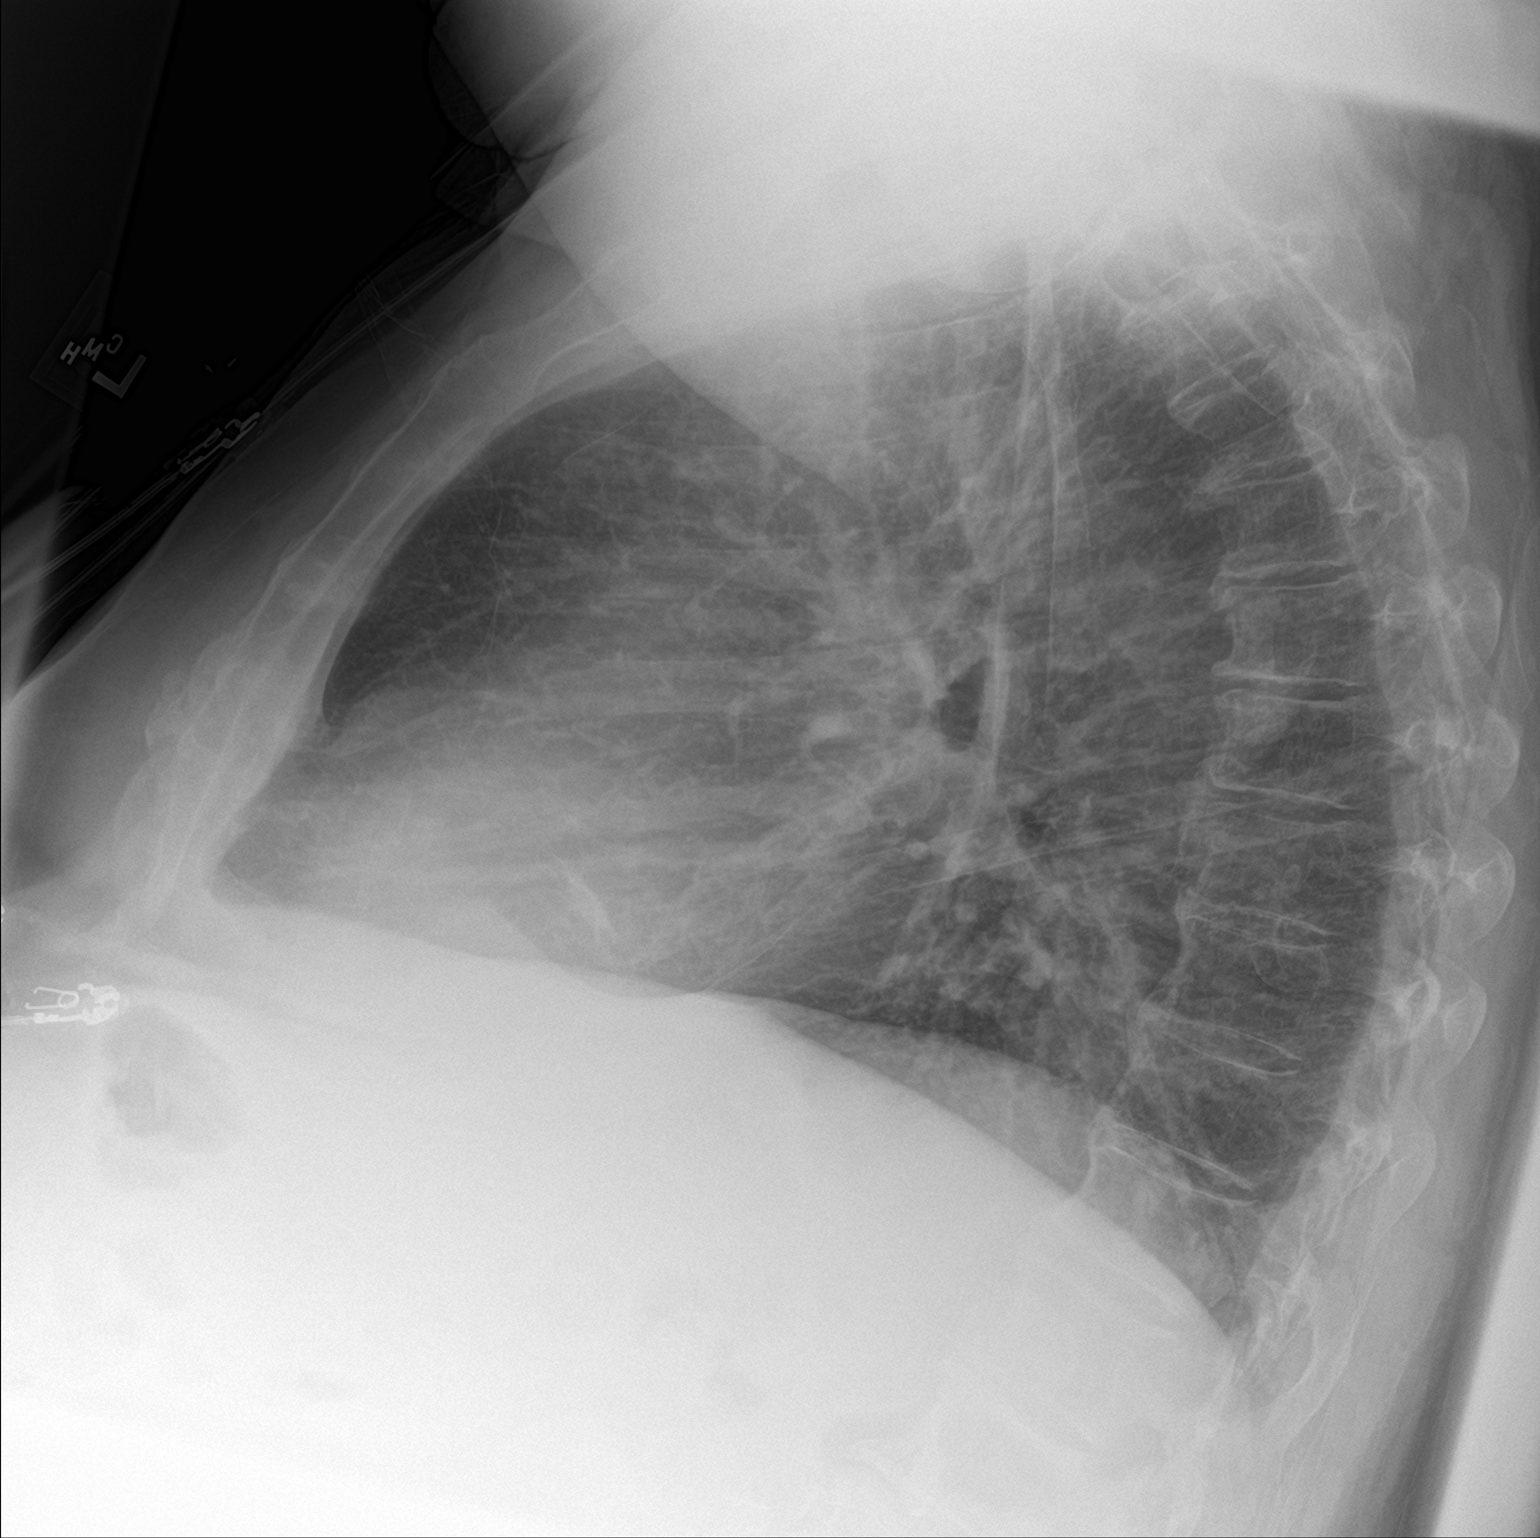
[im 2/2]
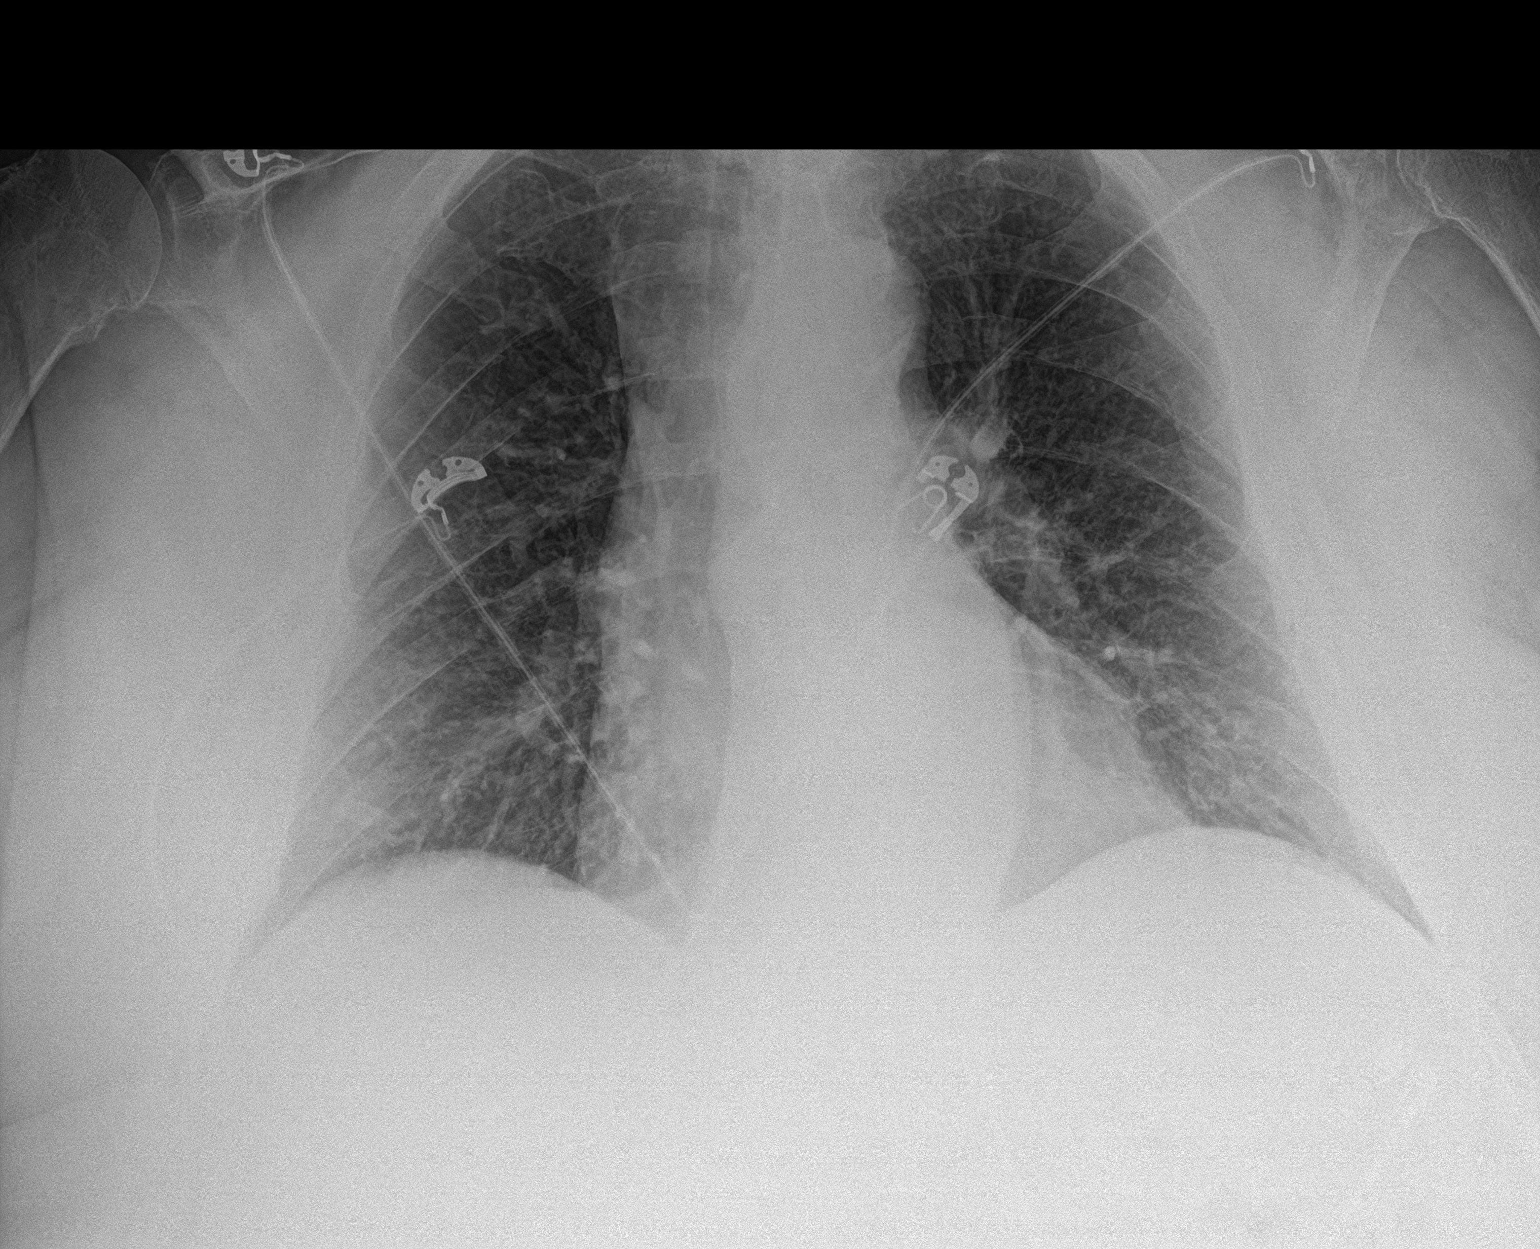

[2 of 2 positions shown; findings below may reference images not displayed]

FINDINGS: The heart size and mediastinal contours are within normal limits.
Both lungs are clear. The visualized skeletal structures are
unremarkable.
IMPRESSION: No active cardiopulmonary disease.

## 2022-06-29 IMAGING — CT CT CERVICAL SPINE W/O CM
3 of 5 series · 11 of 33 positions shown, 13 images · non-contrast
Comparison: None.

CLINICAL DATA: Neck trauma (Age >= 65y)

Near syncope.  Headache and neck pain.
EXAM:
CT CERVICAL SPINE WITHOUT CONTRAST
TECHNIQUE: Multidetector CT imaging of the cervical spine was performed without
intravenous contrast. Multiplanar CT image reconstructions were also
generated.

[Series 10: sagittal bone · sagittal · 0.29mm/px · 5 of 58 slices shown, 6 images]
[im 20/58  bone]
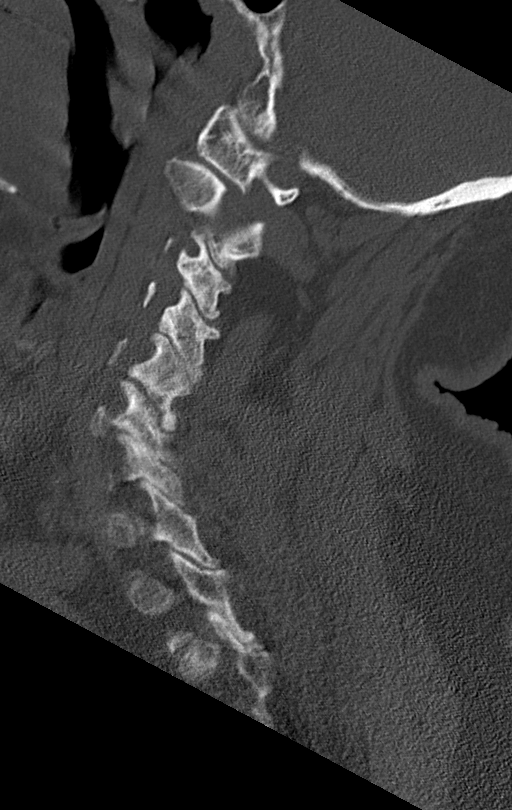
[im 24/58  bone]
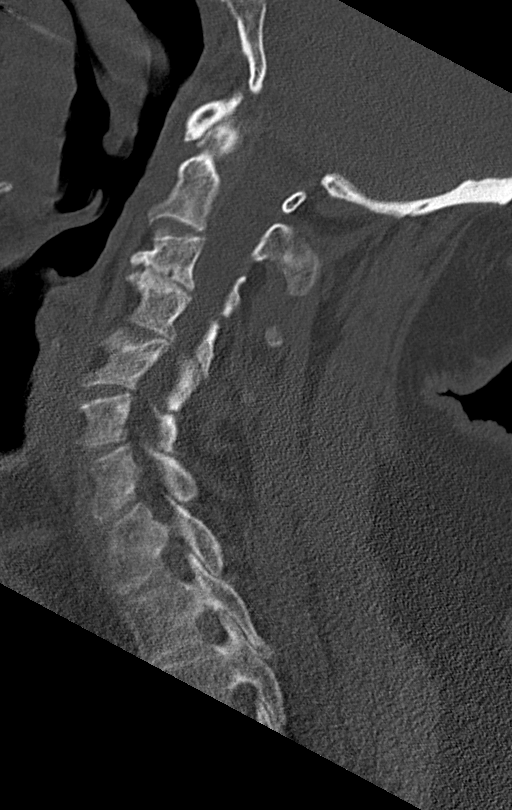
[im 29/58  soft-tissue]
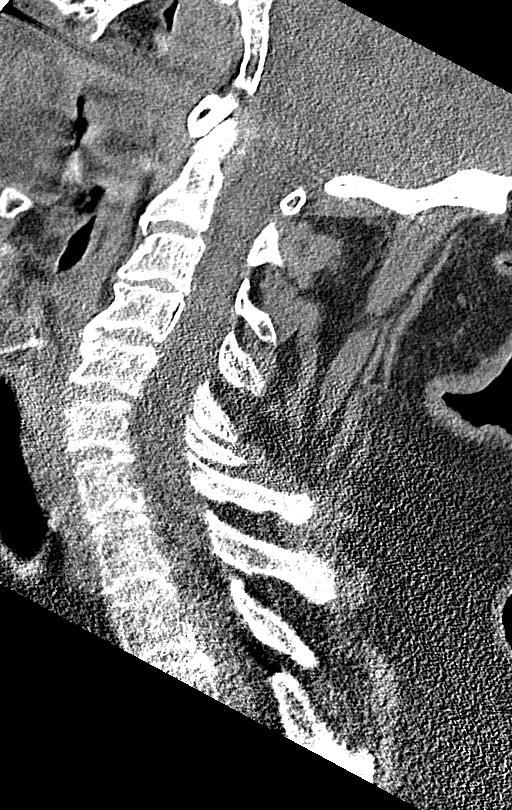
[im 29/58  bone]
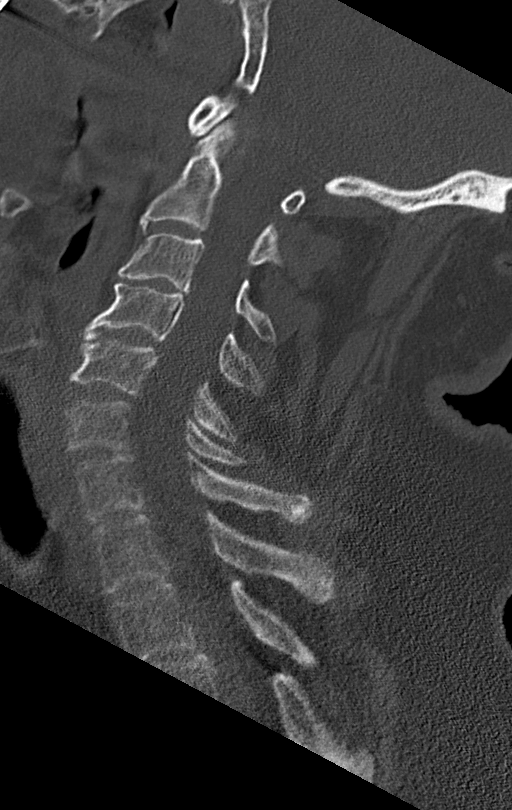
[im 34/58  bone]
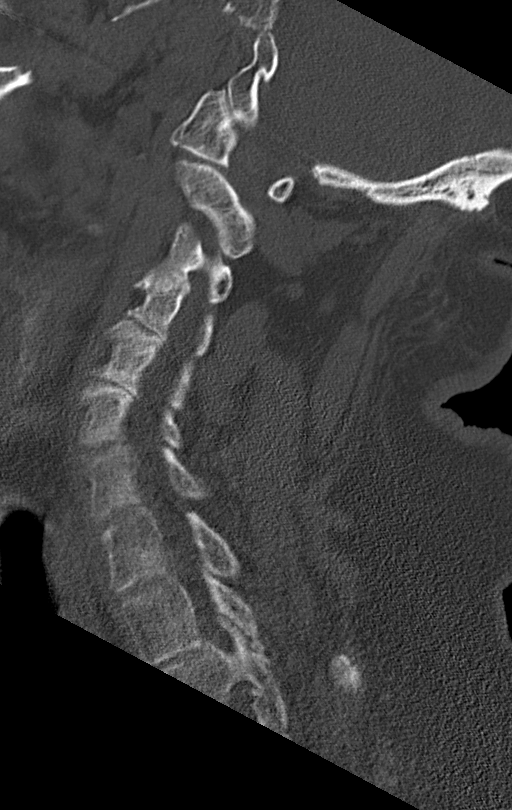
[im 39/58  bone]
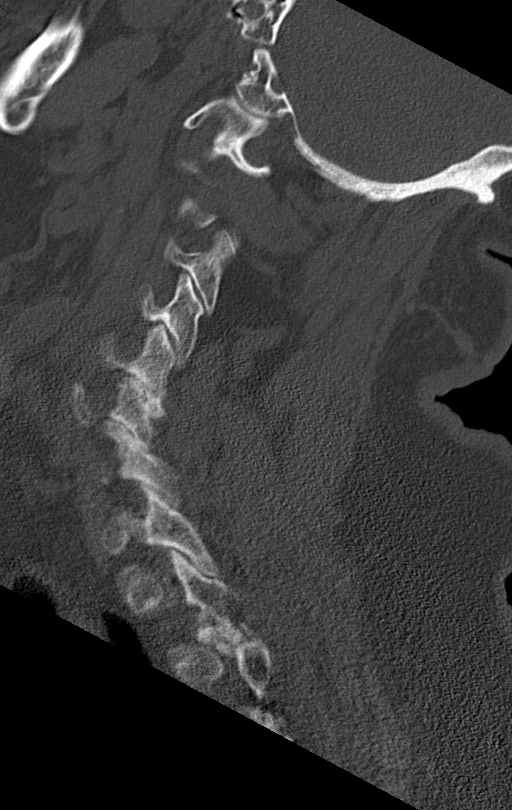

[Series 11: coronal bone · coronal · 0.22mm/px · 3 of 68 slices shown]
[im 19/68  bone]
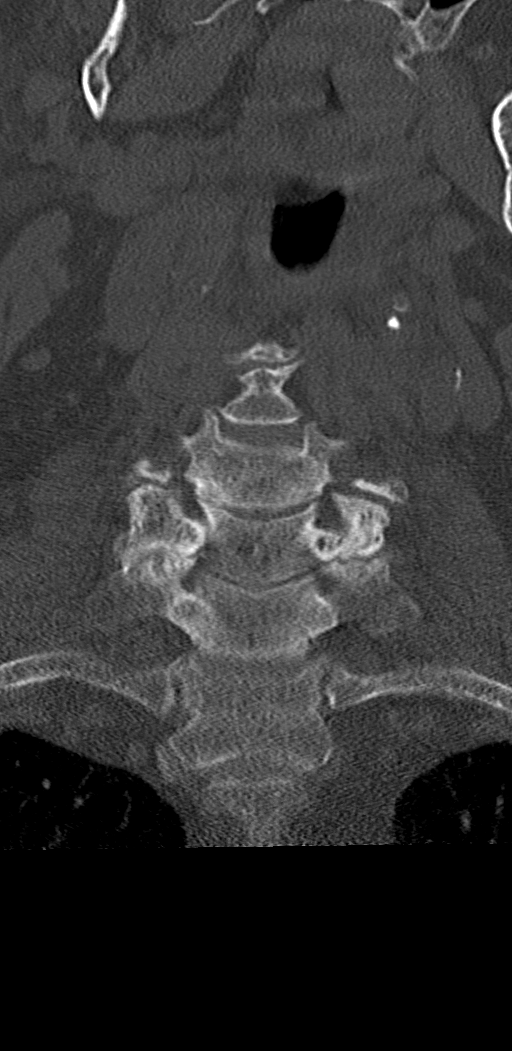
[im 29/68  bone]
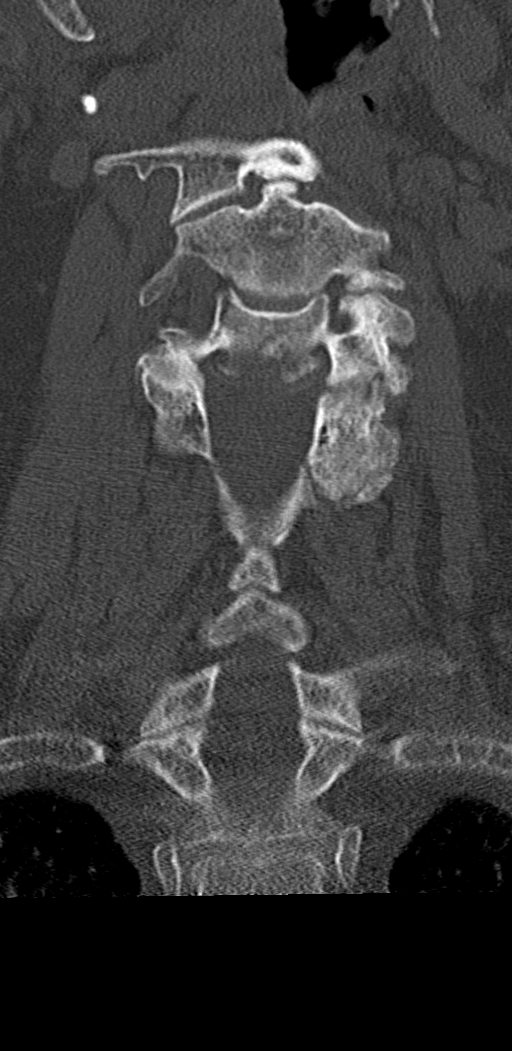
[im 39/68  bone]
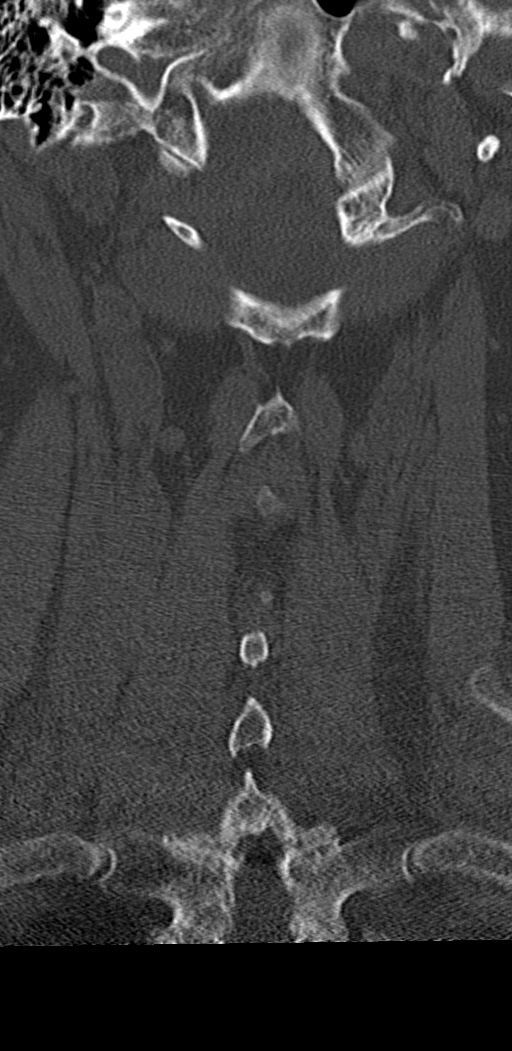

[Series 12: orthogonal bone · axial · 0.25mm/px · z∈[-335,-197]mm · 3 of 118 slices shown, 4 images]
[im 20/118  soft-tissue]
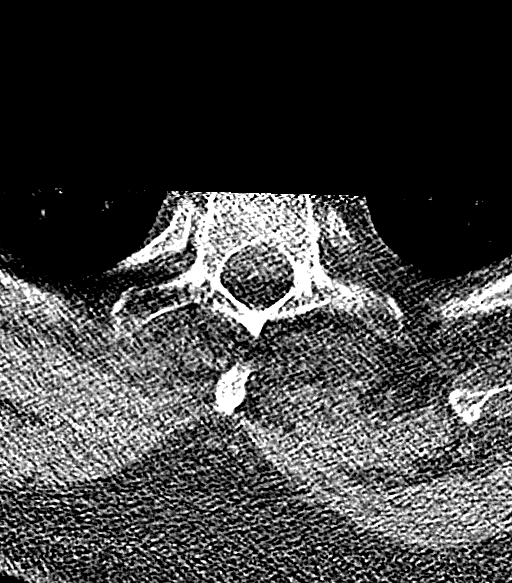
[im 20/118  bone]
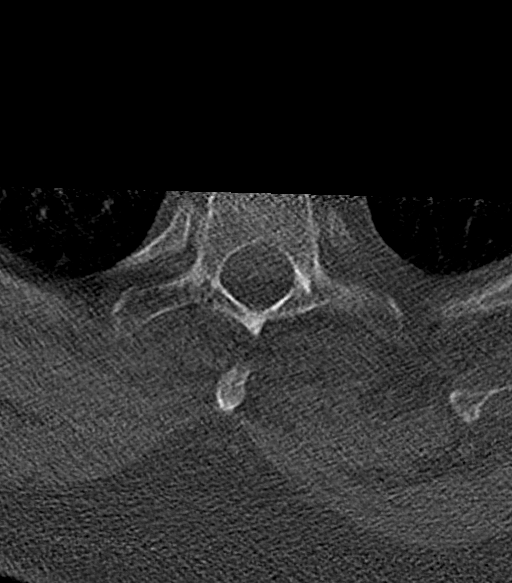
[im 59/118  bone]
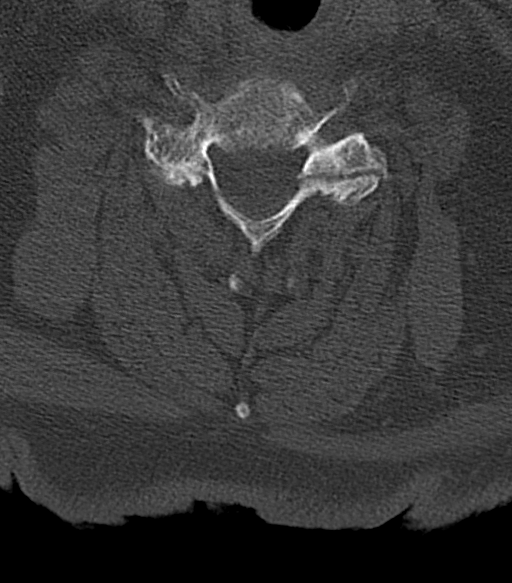
[im 98/118  bone]
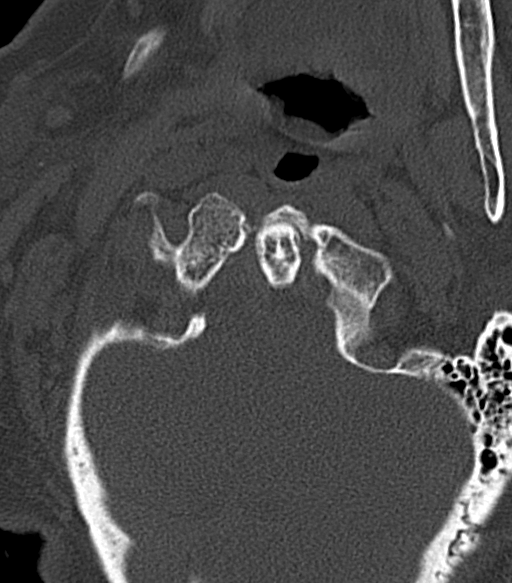

[11 of 33 positions shown; findings below may reference images not displayed]

FINDINGS: Alignment: No traumatic finding. Exaggerated cervical lordosis.
Trace anterolisthesis of C2 on C3 and C6 on C7.

Skull base and vertebrae: No acute fracture. Vertebral body heights
are maintained. The dens and skull base are intact.

Soft tissues and spinal canal: No prevertebral fluid or swelling. No
visible canal hematoma.

Disc levels: Multilevel degenerative disc disease. Disc space
narrowing and endplate spurring at C3-C4, C4-C5, C6-C7. There is
multilevel facet hypertrophy.

Upper chest: No acute findings.

Other: None.
IMPRESSION: Multilevel degenerative disc disease and facet hypertrophy
throughout the cervical spine. No acute fracture or subluxation.

## 2022-07-03 DIAGNOSIS — G4733 Obstructive sleep apnea (adult) (pediatric): Secondary | ICD-10-CM | POA: Diagnosis not present

## 2022-07-07 IMAGING — CT CT HEAD W/O CM
3 series · 14 of 47 positions shown, 16 images · non-contrast
Comparison: CT brain 11/16/2020

CLINICAL DATA: Dizziness

EXAM:
CT HEAD WITHOUT CONTRAST
TECHNIQUE: Contiguous axial images were obtained from the base of the skull
through the vertex without intravenous contrast.

[Series 3: head wo · axial · 0.42mm/px · z∈[+395,+530]mm · 8 of 33 slices shown, 10 images]
[im 3/33  brain]
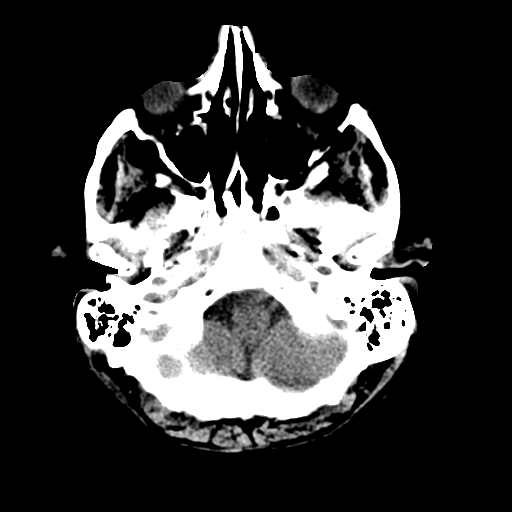
[im 3/33  bone]
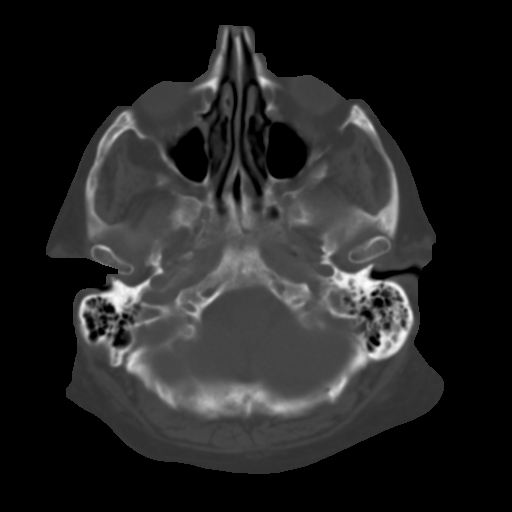
[im 7/33  brain]
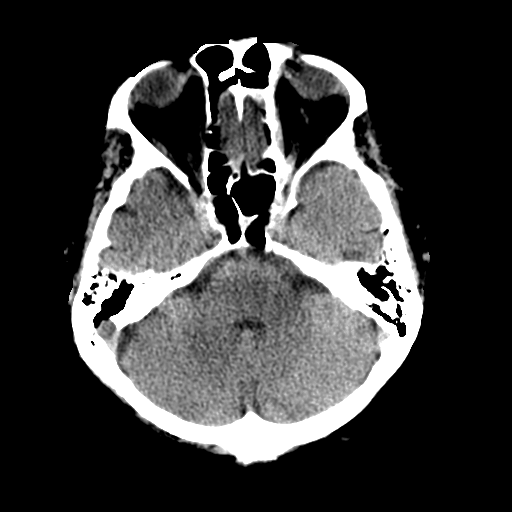
[im 10/33  brain]
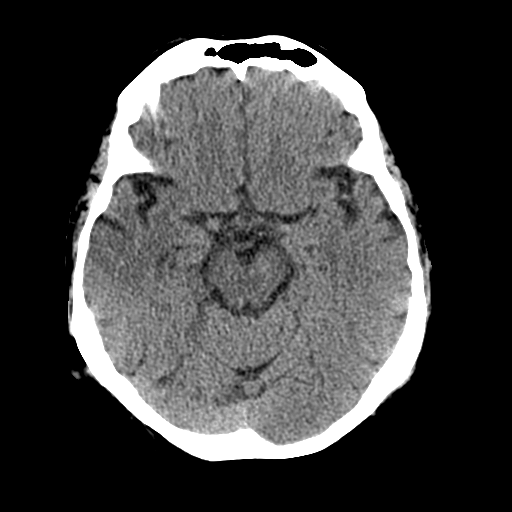
[im 15/33  brain]
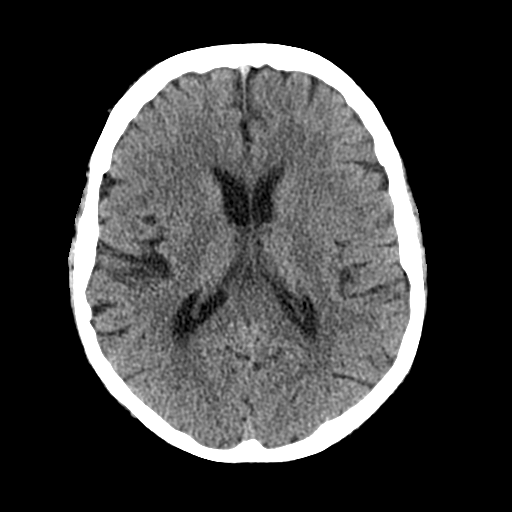
[im 18/33  brain]
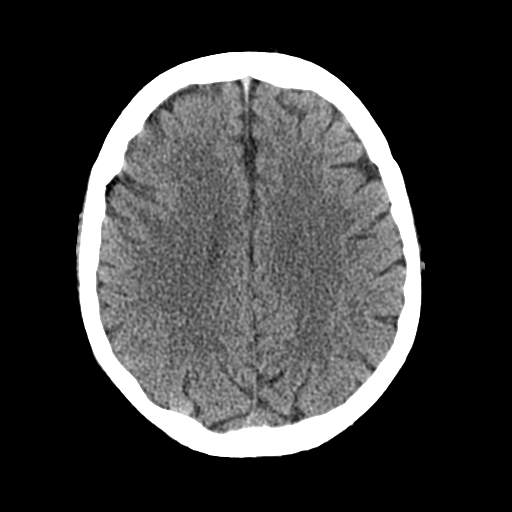
[im 18/33  bone]
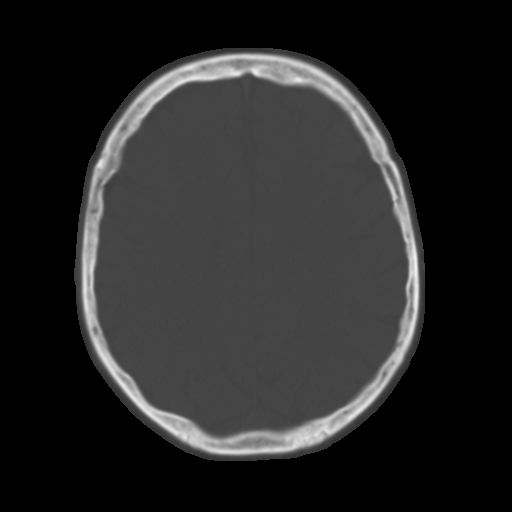
[im 23/33  brain]
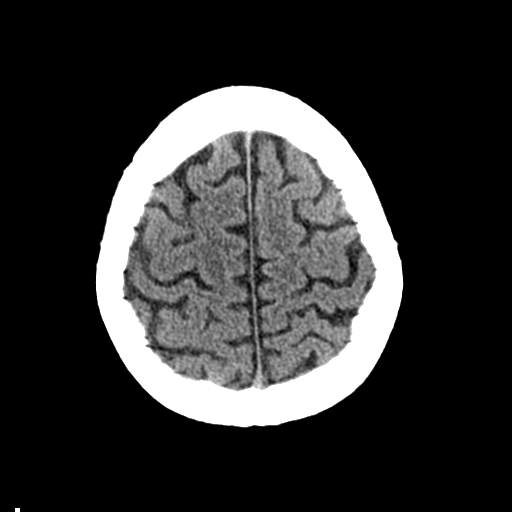
[im 26/33  brain]
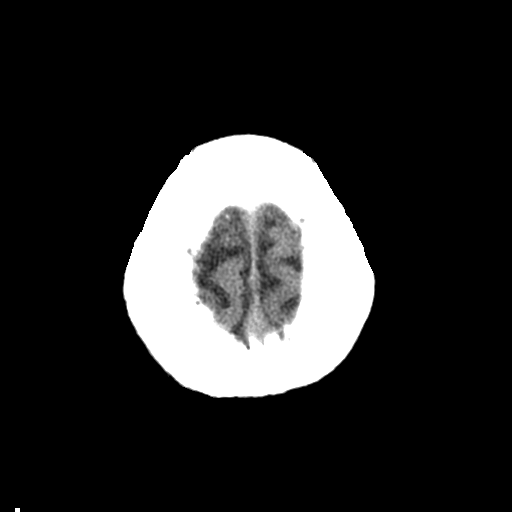
[im 30/33  brain]
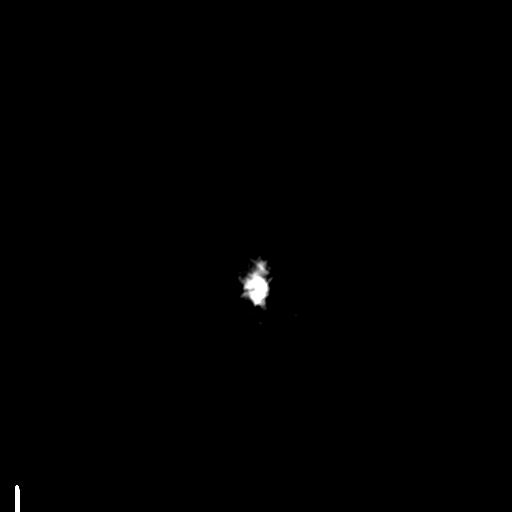

[Series 4: coronal soft tissue · coronal · 0.29mm/px · 3 of 66 slices shown]
[im 22/66  brain]
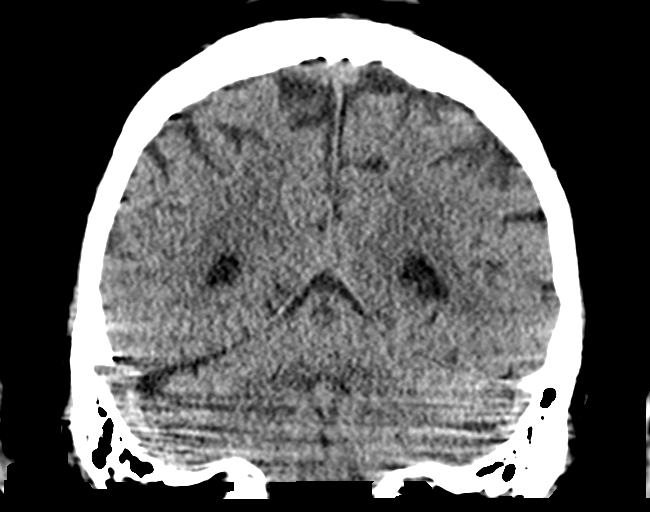
[im 29/66  brain]
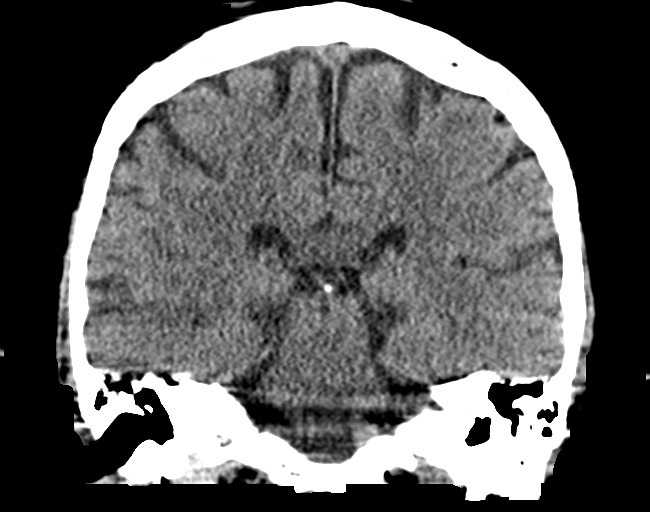
[im 37/66  brain]
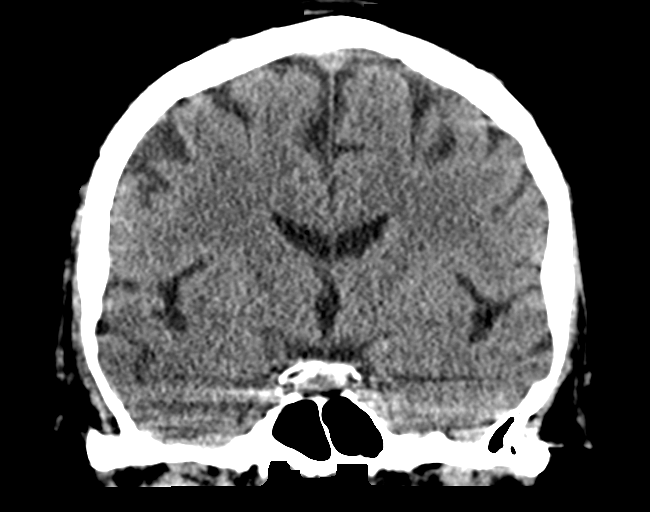

[Series 5: sagittal soft tissue · sagittal · 0.29mm/px · 3 of 60 slices shown]
[im 20/60  brain]
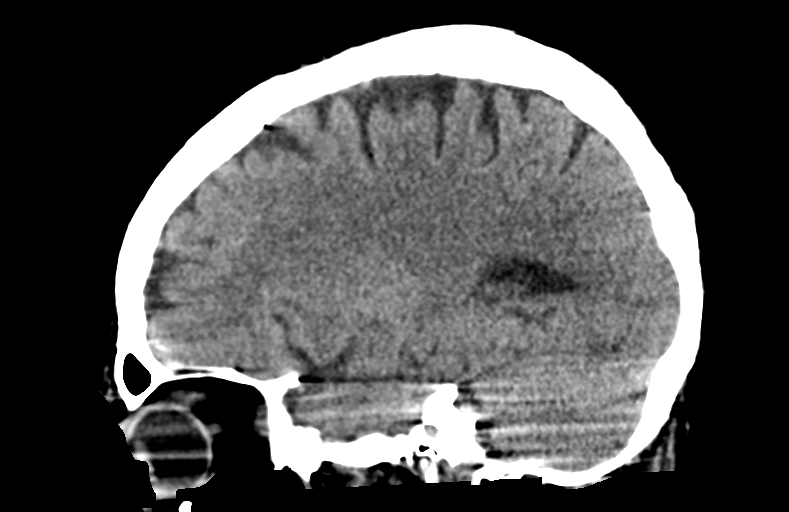
[im 30/60  brain]
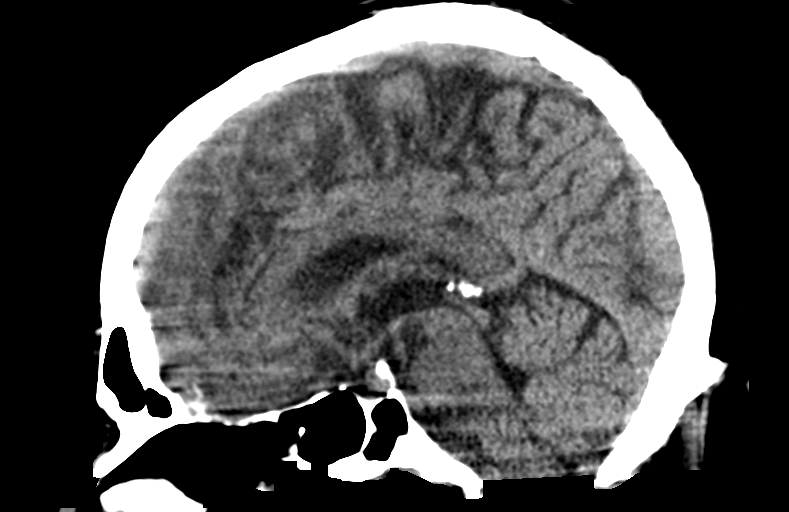
[im 40/60  brain]
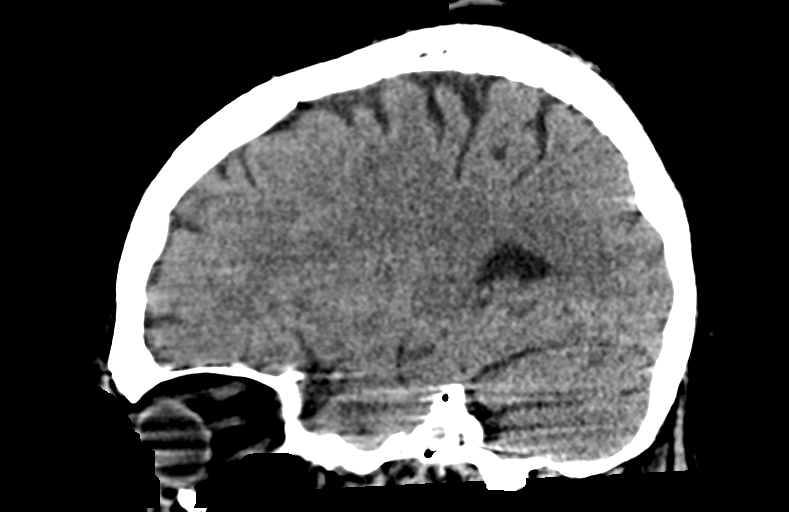

[14 of 47 positions shown; findings below may reference images not displayed]

FINDINGS: Brain: No evidence of acute infarction, hemorrhage, hydrocephalus,
extra-axial collection or mass lesion/mass effect.

Vascular: No hyperdense vessels.  Carotid vascular calcification

Skull: Normal. Negative for fracture or focal lesion.

Sinuses/Orbits: No acute finding.

Other: None
IMPRESSION: Negative non contrasted CT appearance of the brain

## 2022-07-07 IMAGING — CR DG CHEST 2V
2 series · 2 of 2 positions shown · non-contrast
Comparison: 11/16/2020

CLINICAL DATA: Cough, headache, and neck pain.

EXAM:
CHEST - 2 VIEW

[chest lat]
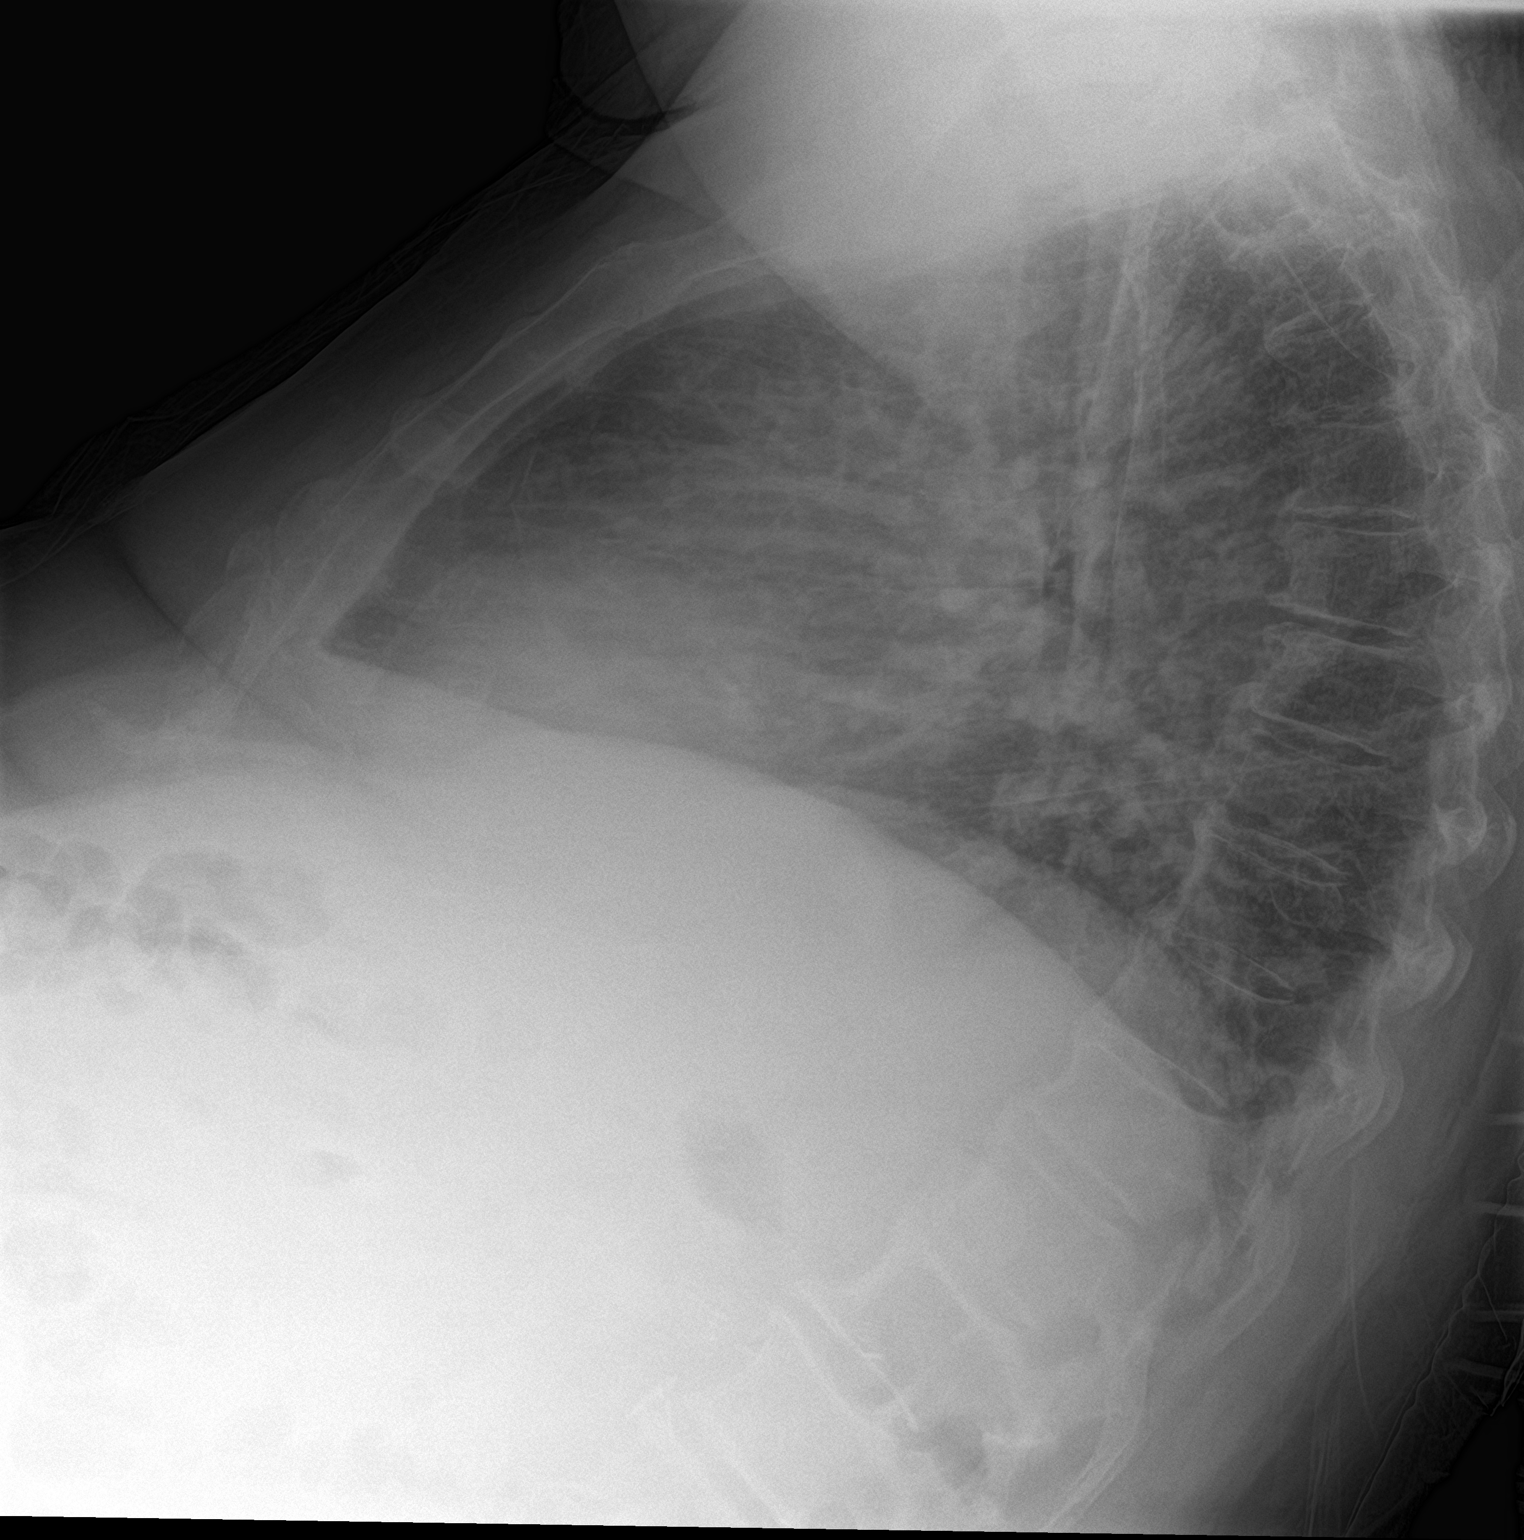

[chest ap]
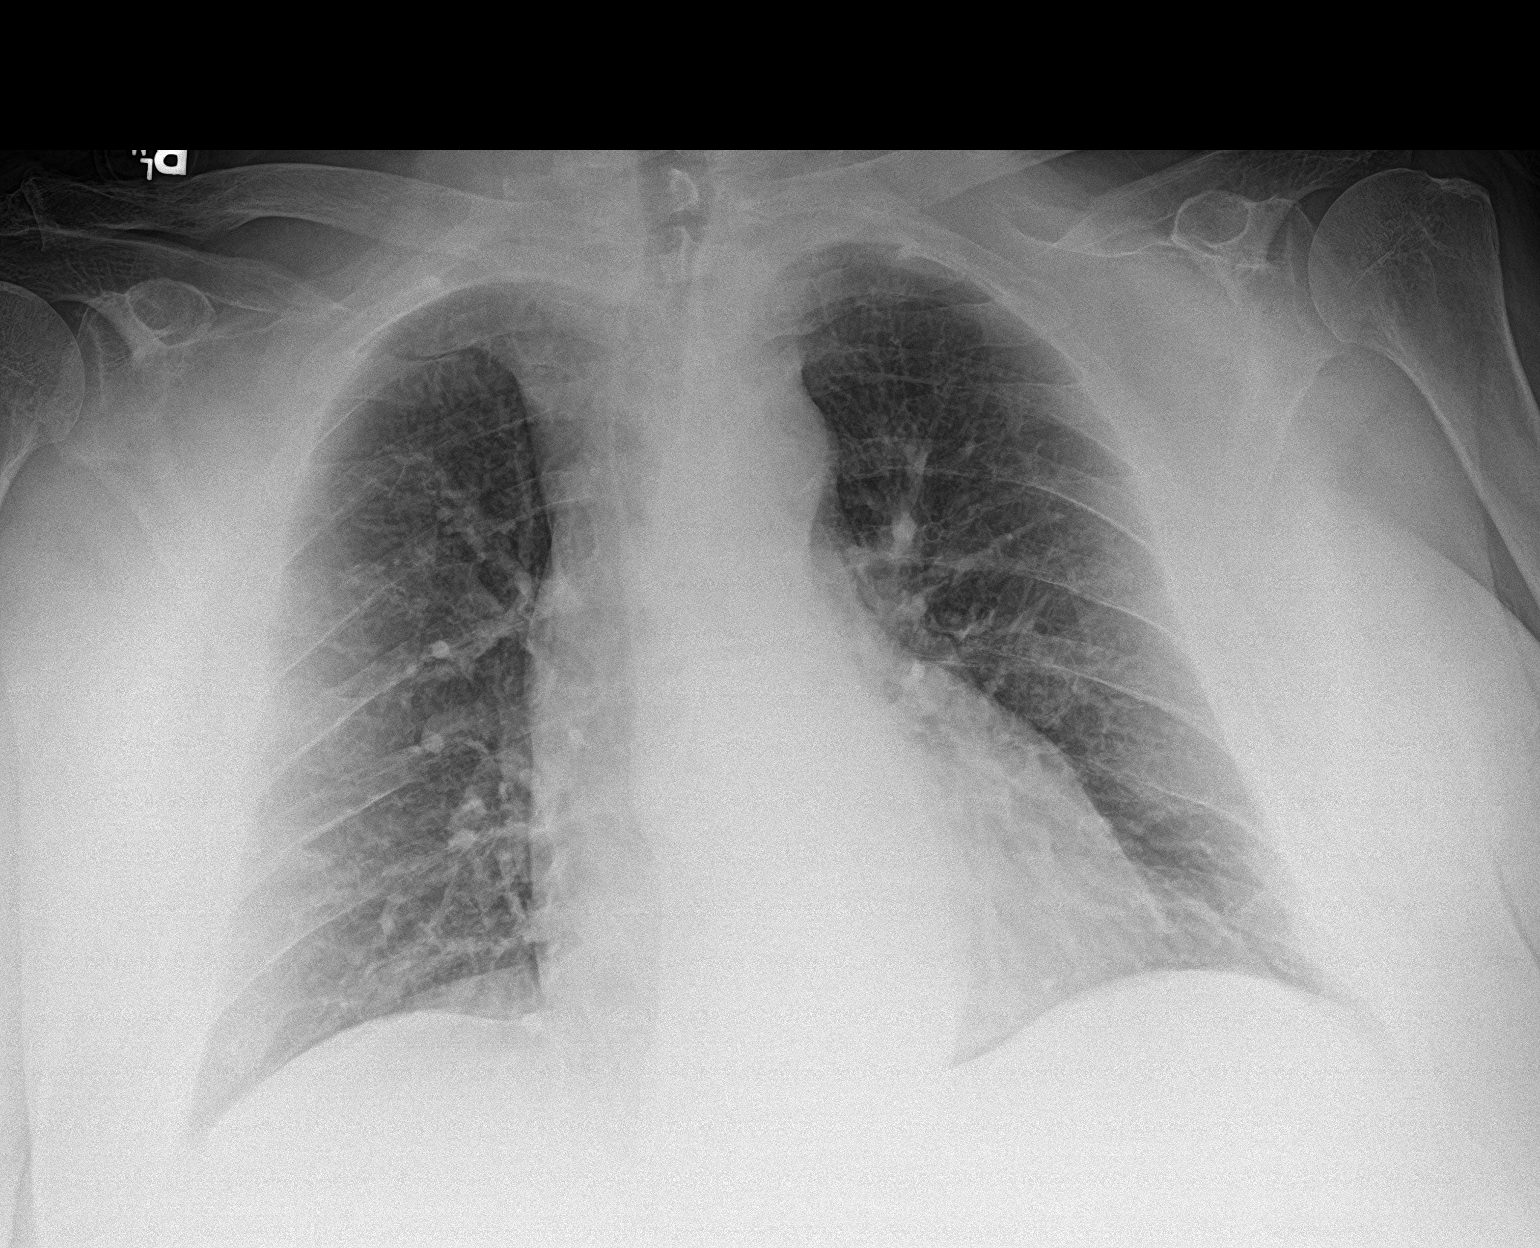

[2 of 2 positions shown; findings below may reference images not displayed]

FINDINGS: The heart size and mediastinal contours are within normal limits.
Both lungs are clear. The visualized skeletal structures are
unremarkable.
IMPRESSION: No active cardiopulmonary disease.

## 2022-07-08 IMAGING — MR MR MRA NECK WO/W CM
1 series · 1 of 1 positions shown · IV contrast (gadavist)
Comparison: Brain MRI today. Brain MRI and intracranial MRA
09/26/2020.

CLINICAL DATA: 65-year-old male with dizziness, headache and neck
pain. Woke with sharp pains in the head and neck. No known injury.

EXAM:
MRA NECK WITHOUT AND WITH CONTRAST
TECHNIQUE: Multiplanar and multiecho pulse sequences of the neck were obtained
without and with intravenous contrast. Angiographic images of the
neck were obtained using MRA technique without and with intravenous
contrast.
CONTRAST:  10mL GADAVIST GADOBUTROL 1 MMOL/ML IV SOLN in conjunction
with contrast enhanced imaging of the brain reported separately.

[Series 1141: mpr (id) range · 0.71mm/px · 1 of 1 slices shown]
[im 1/1]
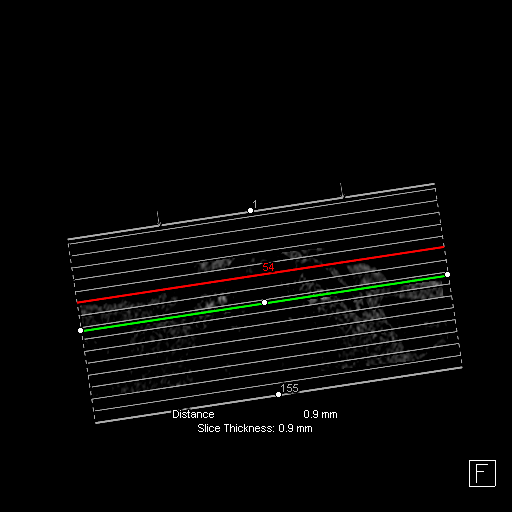

[1 of 1 positions shown; findings below may reference images not displayed]

FINDINGS: 3D time-of-flight images suffer from wrap artifact, but demonstrate
antegrade flow signal in the bilateral cervical carotids and
vertebral arteries.

Initially early neck MRA contrast timing (pulmonary arteries
opacified on series 13). And the later series 17 source images are
degraded by motion and venous contamination.

Thin axial MIP images were created of the most successful source
images (series 16 and 17).

On these both common carotid arteries and carotid bifurcations
appear patent without stenosis. Both cervical ICAs are patent to the
skull base, that on the right appears tortuous in the upper neck
similar to the intracranial MRA source images in [place]. And the left
ICA is tortuous just distal to the bulb. No ICA stenosis is evident.

Both cervical vertebral arteries are patent to the skull base. The
right appears to be dominant and has a late entry into the cervical
transverse foramen. There is no obvious cervical vertebral artery
stenosis.
IMPRESSION: 1. Suboptimal due to contrast bolus timing and artifact.
2. Cervical ICAs and vertebral arteries are patent to the skull
base.
- tortuous cervical ICAs with no stenosis identified.
- dominant right vertebral artery, with no obvious vertebral artery
stenosis.

3. Consider a routine follow-up CTA of the Neck to achieve better
vessel detail.

## 2022-07-08 IMAGING — MR MR HEAD WO/W CM
15 series · 48 of 48 positions shown · IV contrast (gadavist)
Comparison: Head CT without contrast 11/24/2020. Brain MRI and
intracranial MRA 09/26/2020.

CLINICAL DATA: 65-year-old male with dizziness, headache and neck
pain. Woke with sharp pains in the head and neck. No known injury.

EXAM:
MRI HEAD WITHOUT AND WITH CONTRAST
TECHNIQUE: Multiplanar, multiecho pulse sequences of the brain and surrounding
structures were obtained without and with intravenous contrast.
CONTRAST:  10mL GADAVIST GADOBUTROL 1 MMOL/ML IV SOLN

[Series 5: ax dwi_tracew · axial · 3.0mm · 0.65mm/px · z∈[-37,+122]mm · 3 of 50 slices shown]
[im 1/50]
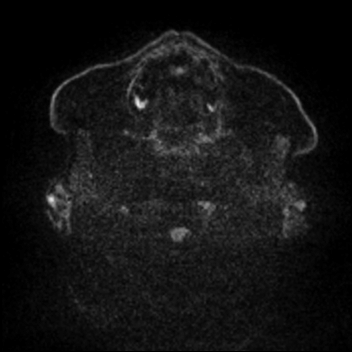
[im 25/50]
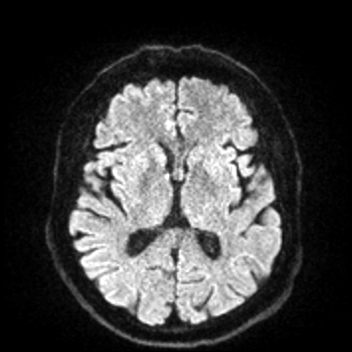
[im 50/50]
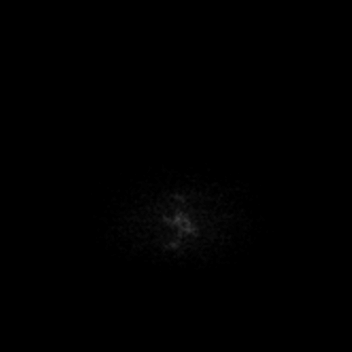

[Series 6: ax dwi_adc · axial · 3.0mm · 0.65mm/px · z∈[-37,+122]mm · 3 of 50 slices shown]
[im 1/50]
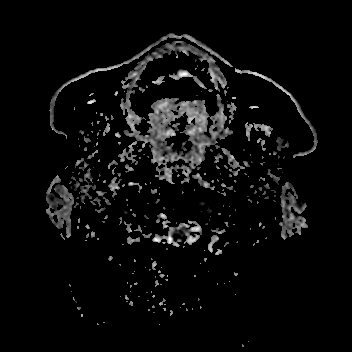
[im 25/50]
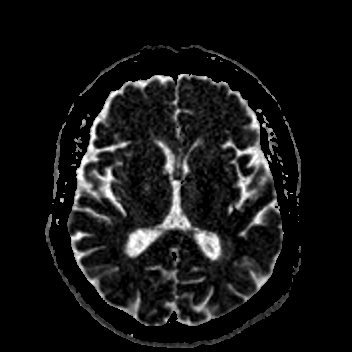
[im 50/50]
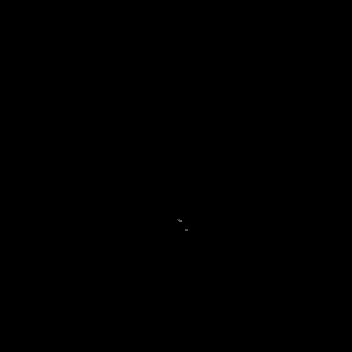

[Series 7: cor dwi_tracew · coronal · 5.0mm · 0.65mm/px · 2 of 40 slices shown]
[im 1/40]
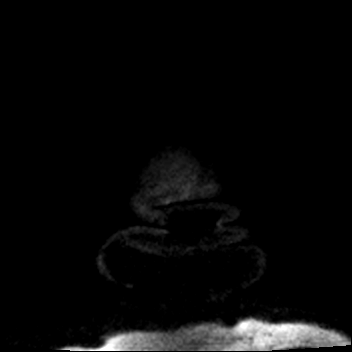
[im 40/40]
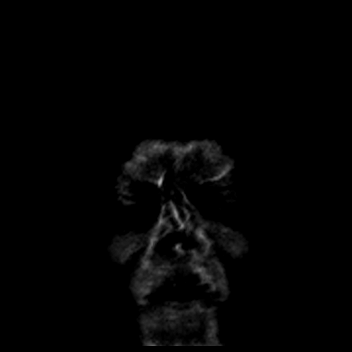

[Series 8: cor dwi_adc · coronal · 5.0mm · 0.65mm/px · 2 of 40 slices shown]
[im 1/40]
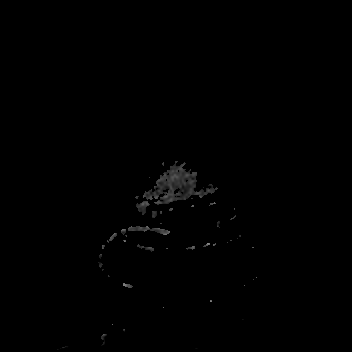
[im 40/40]
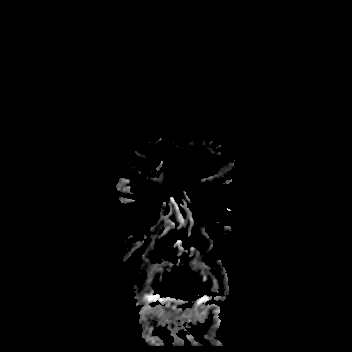

[Series 9: T1 · sagittal · 5.0mm · 0.62mm/px · 1 of 25 slices shown (1 of 3)]
[im 1/25]
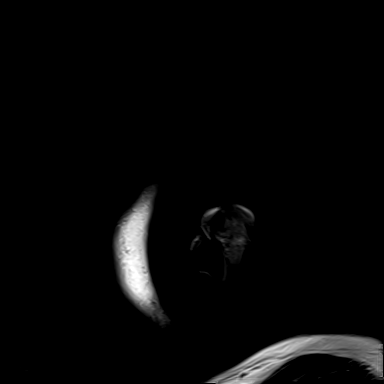

[Series 10: T2 · axial · 5.0mm · 0.53mm/px · z∈[-35,+118]mm · 2 of 27 slices shown (1 of 2)]
[im 1/27]
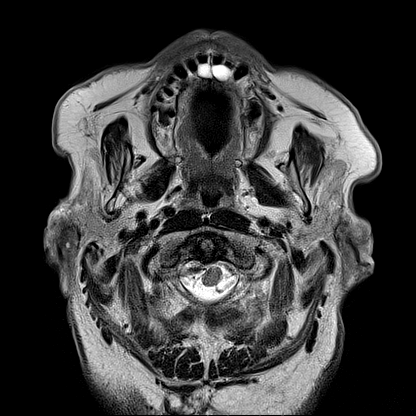
[im 27/27]
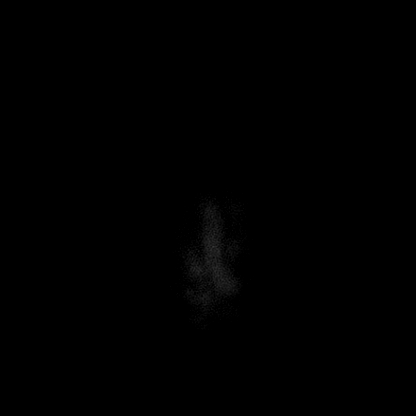

[Series 11: T1 · sagittal · 5.0mm · 0.94mm/px · 1 of 25 slices shown (2 of 3)]
[im 1/25]
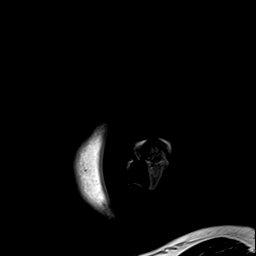

[Series 12: mag_images · axial · 3.0mm · 0.90mm/px · z∈[-45,+129]mm · 3 of 60 slices shown]
[im 1/60]
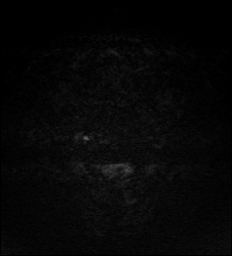
[im 30/60]
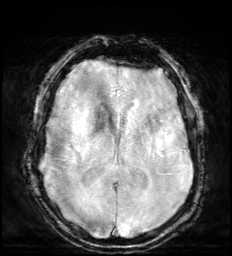
[im 60/60]
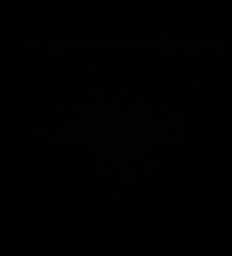

[Series 13: pha_images · axial · 3.0mm · 0.90mm/px · z∈[-33,+126]mm · 3 of 51 slices shown]
[im 1/51]
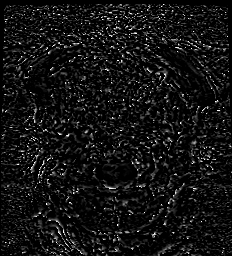
[im 26/51]
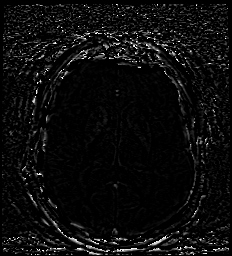
[im 51/51]
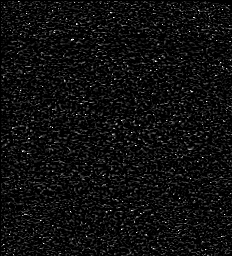

[Series 14: swi_images · axial · 3.0mm · 0.90mm/px · z∈[-45,+129]mm · 3 of 60 slices shown]
[im 1/60]
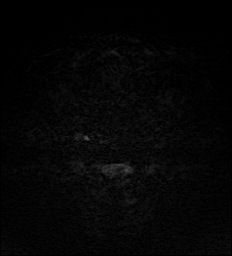
[im 30/60]
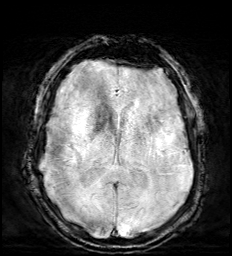
[im 60/60]
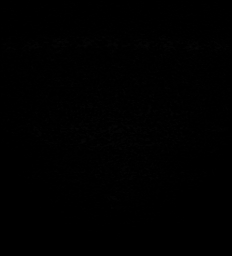

[Series 16: FLAIR · axial · 3.0mm · 1.20mm/px · z∈[-37,+119]mm · 3 of 54 slices shown]
[im 1/54]
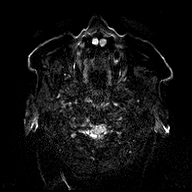
[im 27/54]
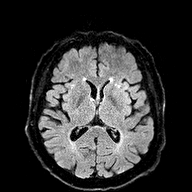
[im 54/54]
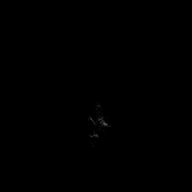

[Series 17: T1 · axial · 1.0mm · 0.98mm/px · z∈[-34,+121]mm · 9 of 159 slices shown (3 of 3)]
[im 1/159]
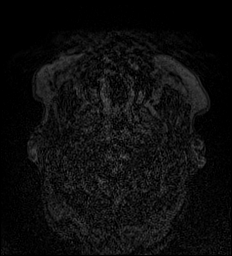
[im 20/159]
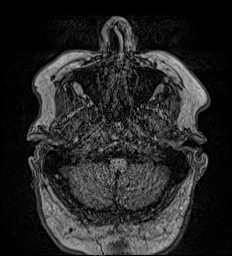
[im 40/159]
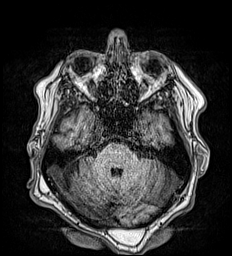
[im 60/159]
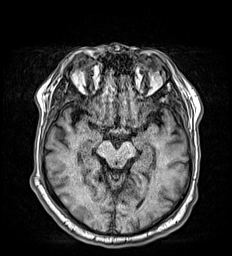
[im 80/159]
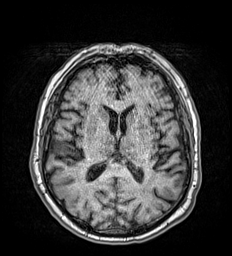
[im 99/159]
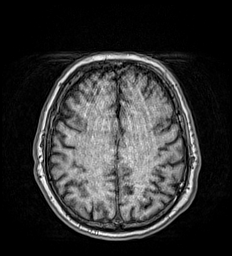
[im 119/159]
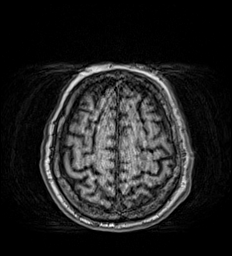
[im 139/159]
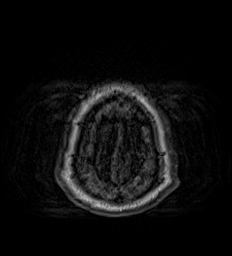
[im 159/159]
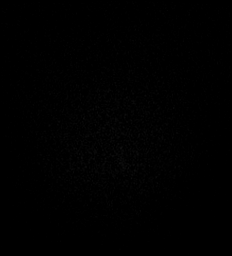

[Series 18: T2 · coronal · 5.0mm · 0.45mm/px · 2 of 29 slices shown (2 of 2)]
[im 1/29]
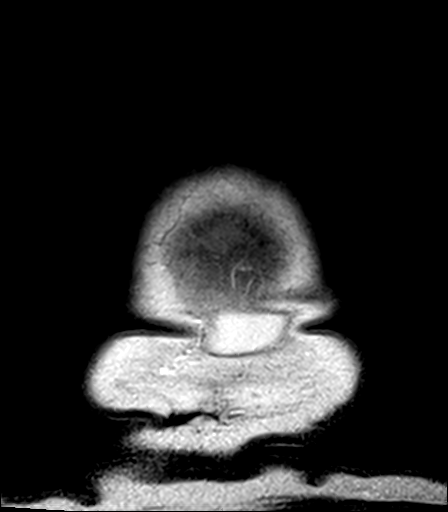
[im 29/29]
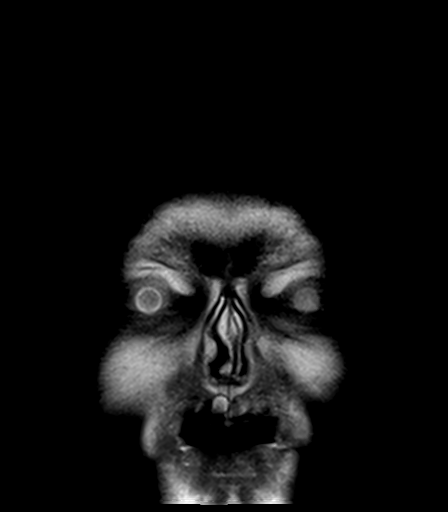

[Series 19: T1 post-contrast · axial · 1.0mm · 0.98mm/px · z∈[-34,+122]mm · 9 of 160 slices shown (1 of 2)]
[im 1/160]
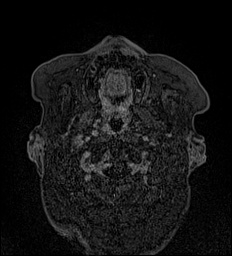
[im 20/160]
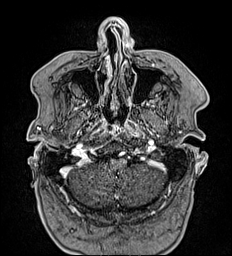
[im 40/160]
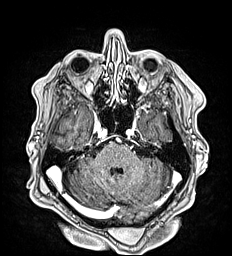
[im 60/160]
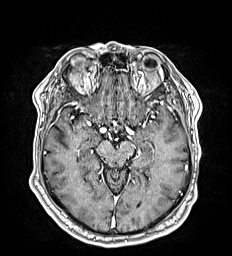
[im 80/160]
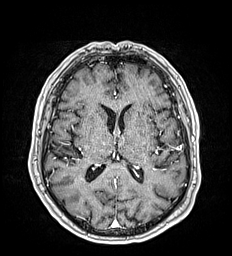
[im 100/160]
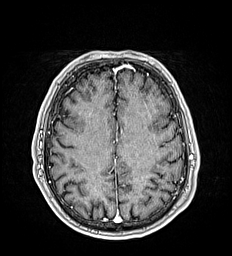
[im 120/160]
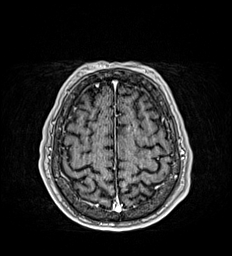
[im 140/160]
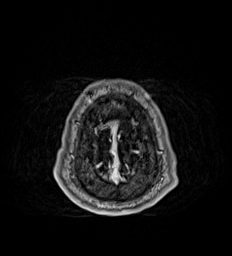
[im 160/160]
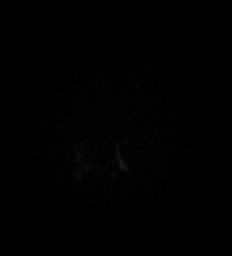

[Series 20: T1 post-contrast · coronal · 5.0mm · 0.57mm/px · 2 of 29 slices shown (2 of 2)]
[im 1/29]
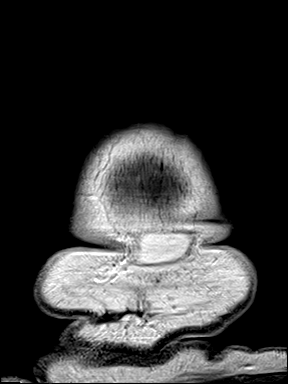
[im 29/29]
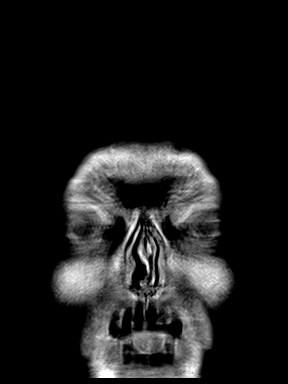

[48 of 48 positions shown; findings below may reference images not displayed]

FINDINGS: Study is mildly degraded by motion artifact despite repeated imaging
attempts today.

Brain: No restricted diffusion to suggest acute infarction. No
midline shift, mass effect, evidence of mass lesion,
ventriculomegaly, extra-axial collection or acute intracranial
hemorrhage. Cervicomedullary junction and pituitary are within
normal limits.

Gray and white matter signal appears stable throughout the brain
since [place] and within normal limits for age. There are occasional
tiny nonspecific cerebral white matter T2 and FLAIR hyperintense
foci (such as series 16, image 27). Susceptibility focus in the
right parietal lobe on series 14, image 40 SWI is stable, and on the
prior study more resemble the vessel than a chronic microhemorrhage.
Deep gray matter nuclei, brainstem and cerebellum remain within
normal limits.

No abnormal enhancement identified.  No dural thickening.

Vascular: Major intracranial vascular flow voids are stable since
[place] with dominant distal right vertebral artery and mild mass
effect from tortuosity on the right cervicomedullary junction.

The major dural venous sinuses are enhancing and appear to be
patent.

Skull and upper cervical spine: Negative. Visualized bone marrow
signal is within normal limits.

Sinuses/Orbits: Stable and negative; trace paranasal sinus mucosal
thickening.

Other: Mild bilateral mastoid effusions have increased since [place].
Negative visible nasopharynx. Otherwise grossly normal visible
internal auditory structures. Unremarkable stylomastoid foramina.
Anterior maxillary dental periapical or hard palate fissural cysts
incidentally noted and stable.
IMPRESSION: 1. No acute intracranial abnormality, with MRI appearance of the
brain stable since [place] and largely normal for age.

2. Bilateral mastoid effusions have developed since [place], and are
likely postinflammatory such as from interval URI or otitis media.
Other visible internal auditory structures appear normal.

## 2022-07-11 ENCOUNTER — Telehealth: Payer: Self-pay

## 2022-07-11 DIAGNOSIS — G4733 Obstructive sleep apnea (adult) (pediatric): Secondary | ICD-10-CM | POA: Diagnosis not present

## 2022-07-11 NOTE — Telephone Encounter (Signed)
Left message for patient to call back  [facility] schedulers have called patient x 2 to schedule MRI scan with no success. Patient needs to call them back at [phone]

## 2022-07-13 IMAGING — CT CT ABD-PELV W/O CM
2 of 4 series · 16 of 46 positions shown, 18 images · non-contrast
Comparison: 10/30/2020 and prior studies

CLINICAL DATA: 65-year-old male with LEFT abdominal and pelvic
pain. History of LEFT nephrectomy.

EXAM:
CT ABDOMEN AND PELVIS WITHOUT CONTRAST
TECHNIQUE: Multidetector CT imaging of the abdomen and pelvis was performed
following the standard protocol without IV contrast.

[Series 2: routine abd/pel wo · axial · 0.98mm/px · z∈[-1035,-550]mm · 13 of 107 slices shown, 15 images]
[im 5/107  soft-tissue]
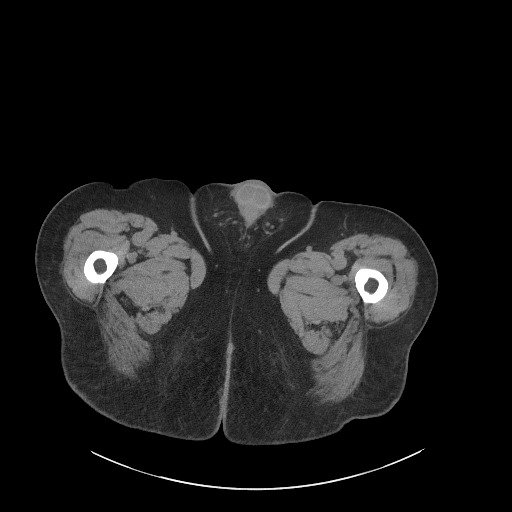
[im 5/107  bone]
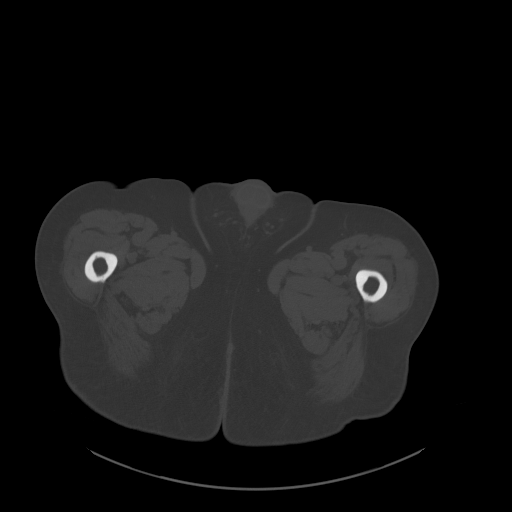
[im 13/107  soft-tissue]
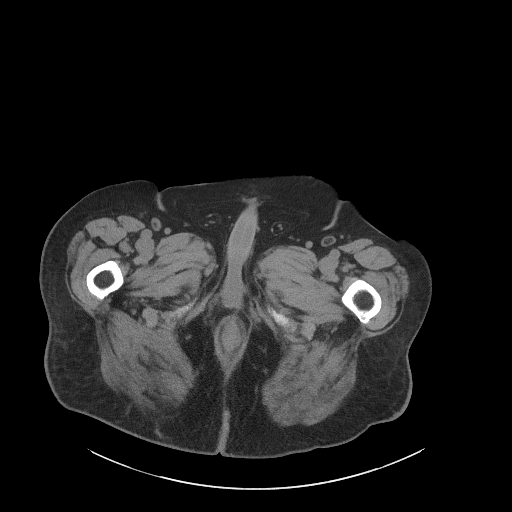
[im 22/107  soft-tissue]
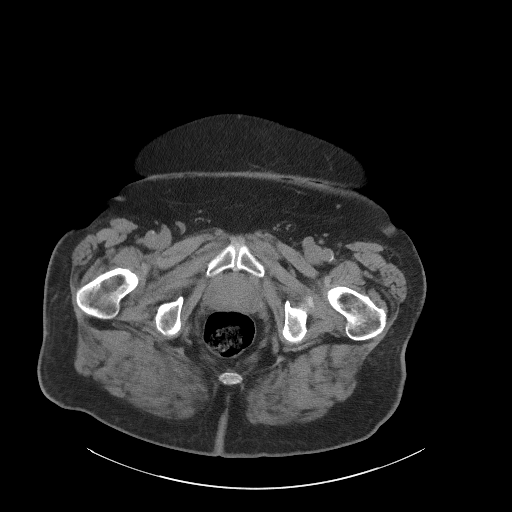
[im 30/107  soft-tissue]
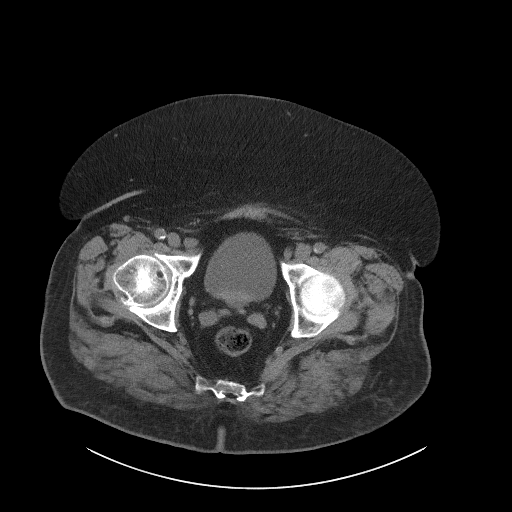
[im 39/107  soft-tissue]
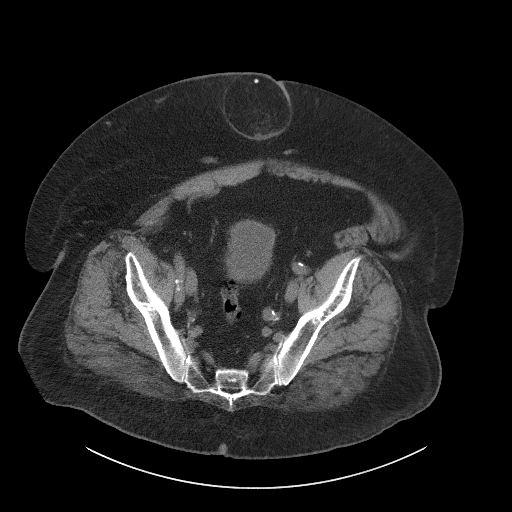
[im 47/107  soft-tissue]
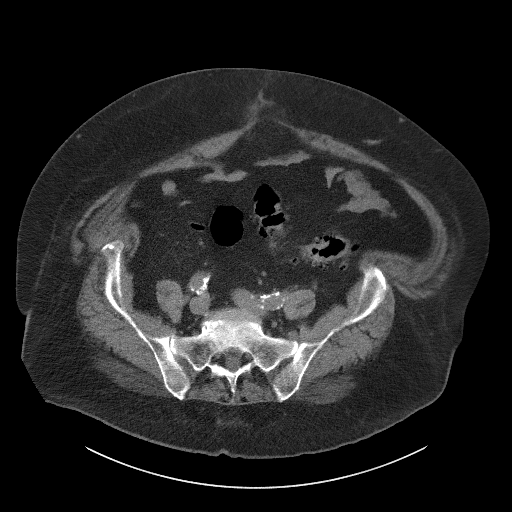
[im 56/107  soft-tissue]
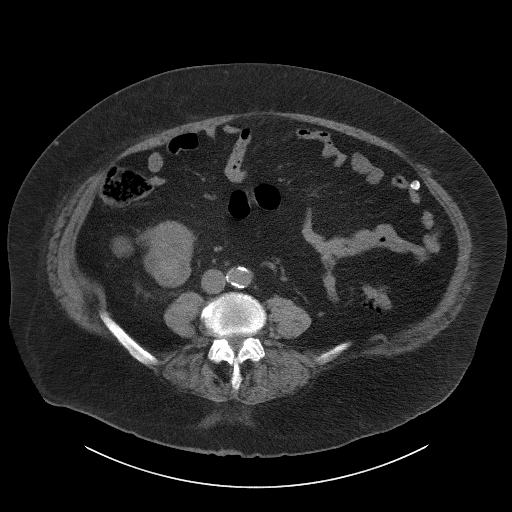
[im 60/107  soft-tissue]
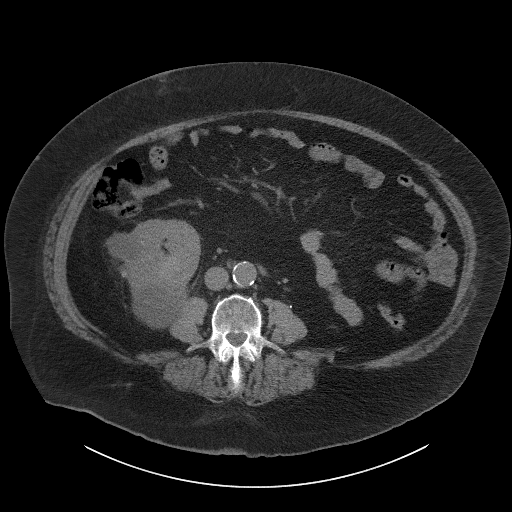
[im 68/107  soft-tissue]
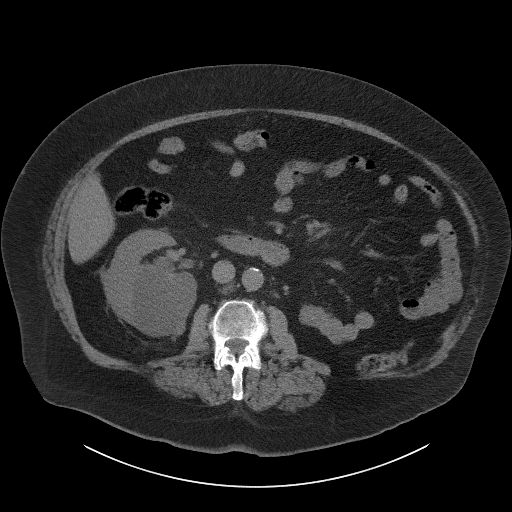
[im 68/107  bone]
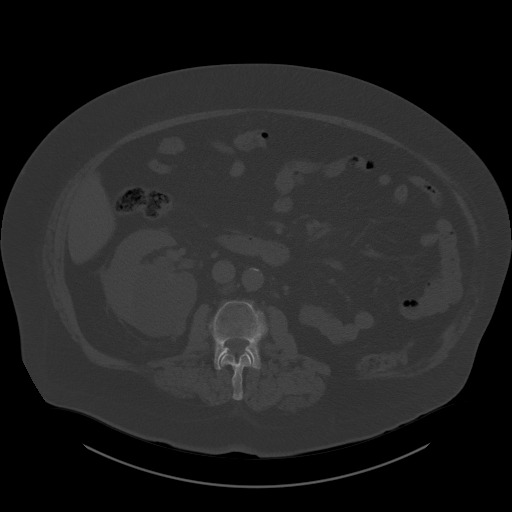
[im 77/107  soft-tissue]
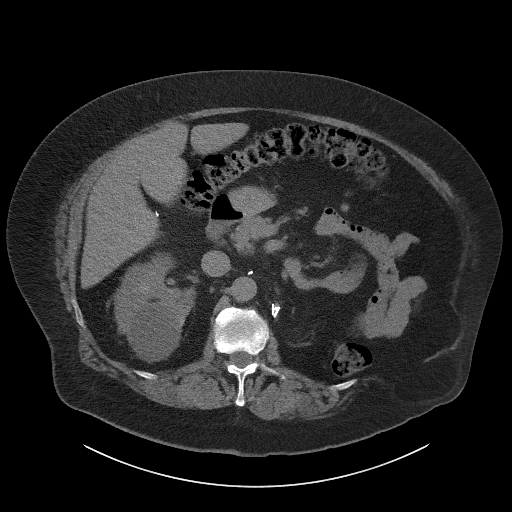
[im 85/107  soft-tissue]
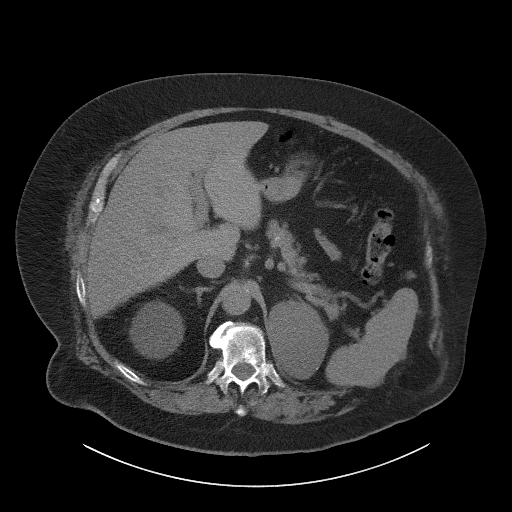
[im 94/107  soft-tissue]
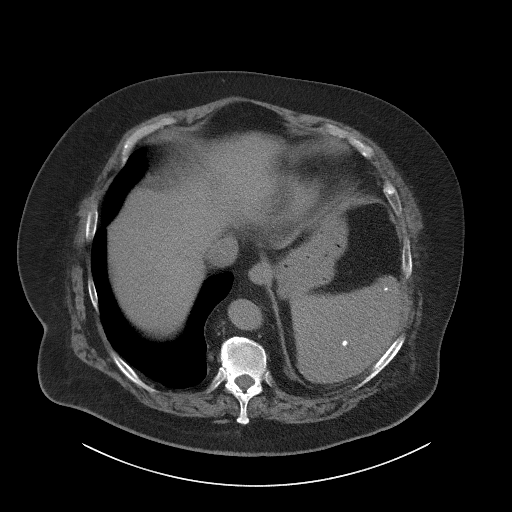
[im 102/107  soft-tissue]
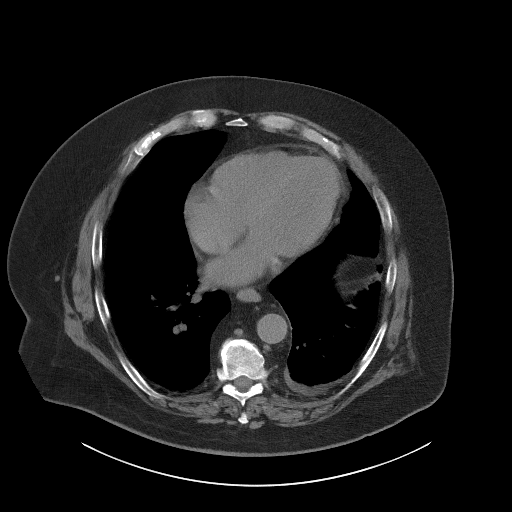

[Series 4: coronal st · coronal · 0.99mm/px · 3 of 131 slices shown]
[im 44/131  soft-tissue]
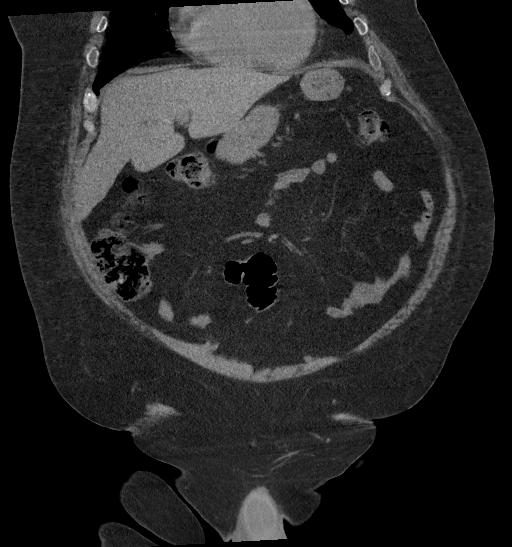
[im 58/131  soft-tissue]
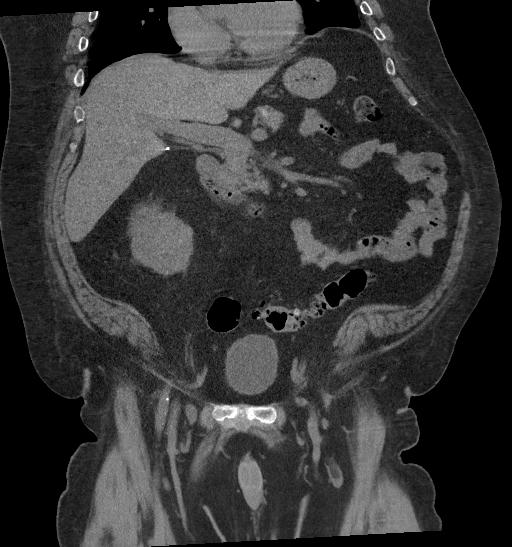
[im 73/131  soft-tissue]
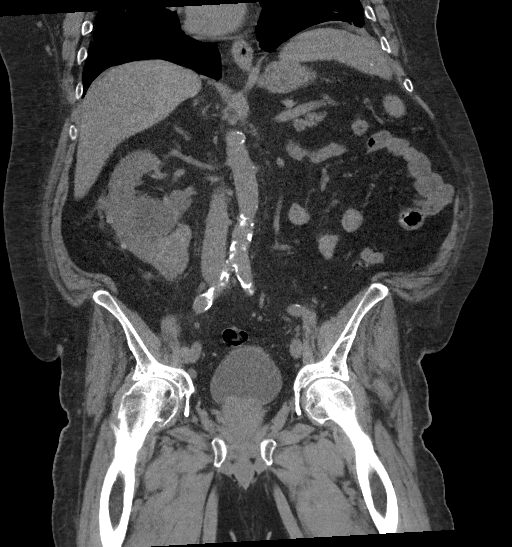

[16 of 46 positions shown; findings below may reference images not displayed]

FINDINGS: Please note that parenchymal abnormalities may be missed without
intravenous contrast.

Lower chest: No acute abnormality. Mild LEFT basilar atelectasis
again noted.

Hepatobiliary: The liver is unremarkable. The patient is status post
cholecystectomy. No biliary dilatation.

Pancreas: Unremarkable

Spleen: Calcified splenic granulomas again noted without other
significant abnormality.

Adrenals/Urinary Tract: A 4 cm heterogeneous renal mass within the
RIGHT LOWER kidney (series 2: Image 51) has slightly increased in
size.

Other RIGHT renal masses of varying density are not significantly
changed.

The patient is status post LEFT nephrectomy.

A 7.2 cm low-density lesion adjacent to the LEFT adrenal gland is
not significantly changed from 3838 but increased in size from
5643.

The RIGHT adrenal gland and bladder are unremarkable.

Stomach/Bowel: Stomach is within normal limits. Appendix appears
normal. No evidence of bowel wall thickening, distention, or
inflammatory changes. Colonic diverticulosis identified without
evidence of acute diverticulitis.

Vascular/Lymphatic: Aortic atherosclerosis. No enlarged abdominal or
pelvic lymph nodes.

Reproductive: Prostate enlargement again noted.

Other: A large umbilical hernia containing fat is again identified.
A posterolateral LEFT abdominal wall/likely surgical hernia connect
obtaining fat again noted.

There is no evidence of ascites or pneumoperitoneum.

Musculoskeletal: No acute or suspicious bony abnormalities are
identified. Degenerative changes in the lumbar spine again noted.
IMPRESSION: 1. No evidence of acute abnormality.
2. 4 cm heterogeneous RIGHT renal mass, slightly increased in size
from 3838. Renal cell carcinoma is not excluded. Consider ultrasound
for initial further evaluation or MRI with a group 3 contrast agent.
3. Unchanged 7.2 cm low-density lesion adjacent to the LEFT adrenal
gland, no significant change from 3838.
4. Unchanged large umbilical hernia containing fat.
5. Aortic Atherosclerosis ([id number]).

## 2022-07-13 NOTE — Telephone Encounter (Signed)
Spoke with patient. Patient states he may have to cancel his appointment on 07/19/22 due to his transportation but will call back to let us know for sure. Patient also states that he can not get MRI done due to having issues with this before to have to lay still, last time when he tried he became sick to his stomach, vomited, also has trouble with bulging discs in his back and can not lay flat like that. He had ER visit in December 2023 and wonders if the CT scans that were done there would be sufficient for what we need.

## 2022-07-13 NOTE — Telephone Encounter (Signed)
No, MRI is needed.  Perhaps he would be able to tolerate with valium as premed?  Hollice Espy, MD

## 2022-07-14 ENCOUNTER — Other Ambulatory Visit: Payer: Self-pay

## 2022-07-14 ENCOUNTER — Emergency Department: Payer: Medicare PPO

## 2022-07-14 ENCOUNTER — Emergency Department
Admission: EM | Admit: 2022-07-14 | Discharge: 2022-07-15 | Disposition: A | Discharge status: Home / Self Care | Payer: Medicare PPO | Attending: Emergency Medicine | Admitting: Emergency Medicine

## 2022-07-14 DIAGNOSIS — R531 Weakness: Secondary | ICD-10-CM | POA: Diagnosis not present

## 2022-07-14 DIAGNOSIS — E1142 Type 2 diabetes mellitus with diabetic polyneuropathy: Secondary | ICD-10-CM | POA: Diagnosis not present

## 2022-07-14 DIAGNOSIS — M1711 Unilateral primary osteoarthritis, right knee: Secondary | ICD-10-CM | POA: Diagnosis not present

## 2022-07-14 DIAGNOSIS — N184 Chronic kidney disease, stage 4 (severe): Secondary | ICD-10-CM | POA: Insufficient documentation

## 2022-07-14 DIAGNOSIS — J449 Chronic obstructive pulmonary disease, unspecified: Secondary | ICD-10-CM | POA: Insufficient documentation

## 2022-07-14 DIAGNOSIS — M25561 Pain in right knee: Secondary | ICD-10-CM | POA: Insufficient documentation

## 2022-07-14 DIAGNOSIS — D631 Anemia in chronic kidney disease: Secondary | ICD-10-CM | POA: Diagnosis not present

## 2022-07-14 DIAGNOSIS — Z85528 Personal history of other malignant neoplasm of kidney: Secondary | ICD-10-CM | POA: Insufficient documentation

## 2022-07-14 DIAGNOSIS — R0981 Nasal congestion: Secondary | ICD-10-CM | POA: Diagnosis not present

## 2022-07-14 DIAGNOSIS — R0602 Shortness of breath: Secondary | ICD-10-CM | POA: Diagnosis not present

## 2022-07-14 DIAGNOSIS — R059 Cough, unspecified: Secondary | ICD-10-CM | POA: Insufficient documentation

## 2022-07-14 DIAGNOSIS — I2510 Atherosclerotic heart disease of native coronary artery without angina pectoris: Secondary | ICD-10-CM | POA: Diagnosis not present

## 2022-07-14 DIAGNOSIS — I5023 Acute on chronic systolic (congestive) heart failure: Secondary | ICD-10-CM | POA: Insufficient documentation

## 2022-07-14 DIAGNOSIS — I130 Hypertensive heart and chronic kidney disease with heart failure and stage 1 through stage 4 chronic kidney disease, or unspecified chronic kidney disease: Secondary | ICD-10-CM | POA: Diagnosis not present

## 2022-07-14 DIAGNOSIS — Z20822 Contact with and (suspected) exposure to covid-19: Secondary | ICD-10-CM | POA: Diagnosis not present

## 2022-07-14 DIAGNOSIS — I10 Essential (primary) hypertension: Secondary | ICD-10-CM | POA: Diagnosis not present

## 2022-07-14 DIAGNOSIS — R600 Localized edema: Secondary | ICD-10-CM | POA: Insufficient documentation

## 2022-07-14 DIAGNOSIS — M25461 Effusion, right knee: Secondary | ICD-10-CM | POA: Diagnosis not present

## 2022-07-14 LAB — CBC WITH DIFFERENTIAL/PLATELET
Abs Immature Granulocytes: 0.03 10*3/uL (ref 0.00–0.07)
Basophils Absolute: 0.1 10*3/uL (ref 0.0–0.1)
Basophils Relative: 1 %
Eosinophils Absolute: 0.1 10*3/uL (ref 0.0–0.5)
Eosinophils Relative: 2 %
HCT: 32.2 % — ABNORMAL LOW (ref 39.0–52.0)
Hemoglobin: 10 g/dL — ABNORMAL LOW (ref 13.0–17.0)
Immature Granulocytes: 1 %
Lymphocytes Relative: 26 %
Lymphs Abs: 1.3 10*3/uL (ref 0.7–4.0)
MCH: 26.5 pg (ref 26.0–34.0)
MCHC: 31.1 g/dL (ref 30.0–36.0)
MCV: 85.2 fL (ref 80.0–100.0)
Monocytes Absolute: 0.3 10*3/uL (ref 0.1–1.0)
Monocytes Relative: 5 %
Neutro Abs: 3.2 10*3/uL (ref 1.7–7.7)
Neutrophils Relative %: 65 %
Platelets: 213 10*3/uL (ref 150–400)
RBC: 3.78 MIL/uL — ABNORMAL LOW (ref 4.22–5.81)
RDW: 14.4 % (ref 11.5–15.5)
WBC: 4.9 10*3/uL (ref 4.0–10.5)
nRBC: 0 % (ref 0.0–0.2)

## 2022-07-14 LAB — TROPONIN I (HIGH SENSITIVITY): Troponin I (High Sensitivity): 15 ng/L (ref <18)

## 2022-07-14 LAB — BASIC METABOLIC PANEL
Anion gap: 10 (ref 5–15)
BUN: 46 mg/dL — ABNORMAL HIGH (ref 8–23)
CO2: 20 mmol/L — ABNORMAL LOW (ref 22–32)
Calcium: 8.5 mg/dL — ABNORMAL LOW (ref 8.9–10.3)
Chloride: 104 mmol/L (ref 98–111)
Creatinine, Ser: 2.53 mg/dL — ABNORMAL HIGH (ref 0.61–1.24)
GFR, Estimated: 27 mL/min — ABNORMAL LOW (ref >60)
Glucose, Bld: 116 mg/dL — ABNORMAL HIGH (ref 70–99)
Potassium: 3.9 mmol/L (ref 3.5–5.1)
Sodium: 134 mmol/L — ABNORMAL LOW (ref 135–145)

## 2022-07-14 LAB — RESP PANEL BY RT-PCR (RSV, FLU A&B, COVID)  RVPGX2
Influenza A by PCR: NEGATIVE
Influenza B by PCR: NEGATIVE
Resp Syncytial Virus by PCR: NEGATIVE
SARS Coronavirus 2 by RT PCR: NEGATIVE

## 2022-07-14 LAB — BRAIN NATRIURETIC PEPTIDE: B Natriuretic Peptide: 87.4 pg/mL (ref 0.0–100.0)

## 2022-07-14 MED ORDER — ACETAMINOPHEN 325 MG PO TABS
650.0000 mg | ORAL_TABLET | Freq: Once | ORAL | Status: AC
  Administered 2022-07-14: 650 mg via ORAL
  Filled 2022-07-14: qty 2

## 2022-07-14 NOTE — ED Triage Notes (Signed)
Pt to ED via [facility] from home. Pt c/o chronic right knee pain and voice is hoarse. CBG 183

## 2022-07-14 NOTE — ED Provider Notes (Signed)
[facility] Provider Note    Event Date/Time   First MD Initiated Contact with Patient 07/14/22 1946     (approximate)   History   Knee Pain   HPI  Larry Callahan is a 67 y.o. male with multiple comorbidities who presents today for evaluation of right knee pain as well as nasal congestion and hoarse voice.  Patient also reports that he has had a cough.  He reports that his knee pain has hurt for many decades.  He denies any new injuries.  He is a poor historian, but reports that his pain feels the same today as it normally does.  He has not had a fever or chills.  He is unsure of any sick contacts.  He is asking for crackers and a ride home.  He denies chest pain or shortness of breath.  Patient Active Problem List   Diagnosis Date Noted   Upper GI bleed    Dizziness and giddiness 97/35/3299   Umbilical hernia 24/26/8341   Acute on chronic systolic CHF (congestive heart failure) ([place]) 08/05/2021   Acquired thrombophilia ([place]) 06/28/2021   Anemia in chronic kidney disease (CKD) 06/28/2021   PAD (peripheral artery disease) (HCC)    Cellulitis of right arm 03/02/2021   Atrial flutter ([place]) 01/07/2021   Demand ischemia    Influenza A 10/30/2020   HLD (hyperlipidemia) 10/06/2020   Gastroesophageal reflux disease without esophagitis 04/19/2020   Elevated troponin I level 01/29/2020   Hematemesis 01/09/2020   Hospital discharge follow-up 11/26/2019   Obesity (BMI 30-39.9) 08/08/2019   Acute right hip pain 06/17/2019   Inability to ambulate due to hip 06/17/2019   CKD (chronic kidney disease), stage IV ([place])    Hypervolemia    Full code status 05/20/2019   Pleuritic chest pain    Acute kidney injury superimposed on CKD 3b ([place]) 05/09/2019   Acute on chronic heart failure with preserved ejection fraction (HFpEF) (HCC)    Chronic systolic CHF (congestive heart failure) ([place]) 03/01/2019   Iron deficiency anemia 12/31/2018   Atopic dermatitis  11/24/2018   Screening for colon cancer 11/06/2017   Uncontrolled type 2 diabetes mellitus with hyperglycemia ([place]) 11/06/2017   Hidradenitis 06/09/2017   Atherosclerotic heart disease of native coronary artery without angina pectoris 06/09/2017   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   Obstructive sleep apnea, adult 06/09/2017   Cigarette nicotine dependence with nicotine-induced disorder 06/09/2017   Diabetic polyneuropathy associated with type 2 diabetes mellitus ([place]) 06/09/2017   Allergic rhinitis due to pollen 06/09/2017   Mixed hyperlipidemia 06/09/2017   COPD (chronic obstructive pulmonary disease) ([place]) 06/09/2017   Wheezing 06/09/2017   Dysuria 06/09/2017   Essential hypertension 06/09/2017   Personal history of other malignant neoplasm of kidney 06/09/2017   Pain in right hip 06/09/2017   Secondary hyperparathyroidism, not elsewhere classified ([place]) 06/09/2017   Tinea corporis 06/09/2017          Physical Exam   Triage Vital Signs: ED Triage Vitals  Enc Vitals Group     BP 07/14/22 1909 (!) 167/109     Pulse Rate 07/14/22 1909 (!) 114     Resp 07/14/22 1909 18     Temp 07/14/22 1909 98.8 F (37.1 C)     Temp Source 07/14/22 1909 Oral     SpO2 07/14/22 1909 95 %     Weight --      Height --      Head Circumference --  Peak Flow --      Pain Score 07/14/22 1840 10     Pain Loc --      Pain Edu? --      Excl. in [place]? --     Most recent vital signs: Vitals:   07/14/22 1909  BP: (!) 167/109  Pulse: (!) 114  Resp: 18  Temp: 98.8 F (37.1 C)  SpO2: 95%    Physical Exam Vitals and nursing note reviewed.  Constitutional:      General: Awake and alert. No acute distress.    Appearance: Normal appearance. The patient is overweight HENT:     Head: Normocephalic and atraumatic.     Mouth: Mucous membranes are moist.  Eyes:     General: PERRL. Normal EOMs        Right eye: No discharge.        Left eye: No  discharge.     Conjunctiva/sclera: Conjunctivae normal.  Cardiovascular:     Rate and Rhythm: Normal rate and regular rhythm.     Pulses: Normal pulses.  Pulmonary:     Effort: Pulmonary effort is normal. No respiratory distress.     Breath sounds: Normal breath sounds.  Able to speak easily in complete sentences Abdominal:     Abdomen is soft. There is no abdominal tenderness. No rebound or guarding. No distention. Musculoskeletal:        General: No swelling. Normal range of motion.     Cervical back: Normal range of motion and neck supple.  Right knee: No deformity or rash. No joint line tenderness. No patellar tenderness, no ballotment Warm and well perfused extremity with 2+ pedal pulses 5/5 strength to dorsiflexion and plantarflexion at the ankle with intact sensation throughout extremity Normal range of motion of the knee, with intact flexion and extension to active and passive range of motion. Extensor mechanism intact. No ligamentous laxity. Negative anterior/posterior drawer/negative lachman, negative mcmurrays No effusion or warmth Intact quadriceps, hamstring function, patellar tendon function Pelvis stable Full ROM of ankle without pain or swelling Foot warm and well perfused Legs equal in circumference bilaterally, no pitting edema Skin:    General: Skin is warm and dry.     Capillary Refill: Capillary refill takes less than 2 seconds.     Findings: No rash.  Neurological:     Mental Status: The patient is awake and alert.      ED Results / Procedures / Treatments   Labs (all labs ordered are listed, but only abnormal results are displayed) Labs Reviewed  BASIC METABOLIC PANEL - Abnormal; Notable for the following components:      Result Value   Sodium 134 (*)    CO2 20 (*)    Glucose, Bld 116 (*)    BUN 46 (*)    Creatinine, Ser 2.53 (*)    Calcium 8.5 (*)    GFR, Estimated 27 (*)    All other components within normal limits  CBC WITH  DIFFERENTIAL/PLATELET - Abnormal; Notable for the following components:   RBC 3.78 (*)    Hemoglobin 10.0 (*)    HCT 32.2 (*)    All other components within normal limits  RESP PANEL BY RT-PCR (RSV, FLU A&B, COVID)  RVPGX2  BRAIN NATRIURETIC PEPTIDE  TROPONIN I (HIGH SENSITIVITY)  TROPONIN I (HIGH SENSITIVITY)     EKG     RADIOLOGY I independently reviewed and interpreted imaging and agree with radiologists findings.     PROCEDURES:  Critical Care performed:  Procedures   MEDICATIONS ORDERED IN ED: Medications  acetaminophen (TYLENOL) tablet 650 mg (has no administration in time range)     IMPRESSION / MDM / ASSESSMENT AND PLAN / ED COURSE  I reviewed the triage vital signs and the nursing notes.   Differential diagnosis includes, but is not limited to, osteoarthritis, ligamental injury, COVID, flu, pneumonia, COPD exacerbation, heart failure exacerbation.  Patient presents emergency department nontoxic in appearance.  He is able to speak easily in complete sentences.  He is afebrile and has a normal oxygen saturation on room air.  He has no pleurisy, hypoxia, clinical signs or symptoms of DVT, and he is anticoagulated.  He has no calf tenderness.  I do not suspect PE or DVT.  Considered differential diagnoses include effusion, sprain, contusion, dislocation, fracture, joint infection, tendon rupture. No evidence of neurological deficit or vascular compromise on exam. No fracture/dislocation on X-Ray. No deformity or obvious ligamentous laxity on exam. No constitutional symptoms or effusion to suggest septic joint.  He has URI type symptoms, therefore labs and swabs obtained in triage are overall reassuring.  His creatinine is at his baseline.  His BNP and troponin are within normal limits.  He does not appear to be volume overloaded.  His chest x-ray does not demonstrate evidence of pneumonia.   There is no warmth, erythema, fever, and patient has full active and passive  range of motion of his knee, do not suspect septic joint or gout, and no indication for diagnostic or therapeutic procedure such as arthrocentesis. Patient felt reassured after his negative workup. Return precautions and care instructions discussed. Outpatient follow-up advised. Patient agrees with plan of care.  He was discharged in stable condition.   Patient's presentation is most consistent with acute complicated illness / injury requiring diagnostic workup.    FINAL CLINICAL IMPRESSION(S) / ED DIAGNOSES   Final diagnoses:  Acute pain of right knee     Rx / DC Orders   ED Discharge Orders     None        Note:  This document was prepared using Dragon voice recognition software and may include unintentional dictation errors.   Emeline Gins 07/14/22 2340    Lavonia Drafts, MD 07/16/22 [phone]

## 2022-07-14 NOTE — Telephone Encounter (Signed)
Left message to call back  

## 2022-07-14 NOTE — Discharge Instructions (Addendum)
Your blood work and x-rays look reassuring.  Please follow-up with your outpatient provider.  Please return for any new, worsening, or change in symptoms or other concerns.  It was a pleasure caring for you today.

## 2022-07-15 ENCOUNTER — Telehealth: Payer: Self-pay | Admitting: Nurse Practitioner

## 2022-07-15 ENCOUNTER — Other Ambulatory Visit: Payer: Self-pay

## 2022-07-15 MED ORDER — CYANOCOBALAMIN 1000 MCG PO TABS: 1000.0000 ug | ORAL_TABLET | Freq: Every day | ORAL | 1 refills | Status: DC

## 2022-07-15 NOTE — ED Notes (Signed)
E signature pad not working. Pt educated on discharge instructions and verbalized understanding.  

## 2022-07-15 NOTE — Telephone Encounter (Signed)
Lvm to schedule ED follow up-[name] 

## 2022-07-18 NOTE — Addendum Note (Signed)
Addended by: Kris Mouton on: 07/18/2022 04:22 PM   Modules accepted: Orders

## 2022-07-18 NOTE — Telephone Encounter (Signed)
Patient advised. Patient is willing to try Valium, please send to [facility]. Patient wanted to get MRI done in April. I called and scheduled patient for 4/8 for MRI-nothing to eat or drink for 4 hours prior, ok to take Valium, f/u scheduled for 09/27/22. Patient was advised and also left that information for his record on his voicemail.  Randell Patient, his MRI PA will expire on 07/27/22-can you re authorize for scheduled date please.

## 2022-07-18 NOTE — Telephone Encounter (Signed)
Called both numbers in the chart and left a message

## 2022-07-19 ENCOUNTER — Ambulatory Visit: Payer: Medicare PPO | Admitting: Urology

## 2022-07-21 ENCOUNTER — Other Ambulatory Visit: Payer: Self-pay

## 2022-07-21 ENCOUNTER — Emergency Department: Payer: Medicare PPO

## 2022-07-21 ENCOUNTER — Emergency Department
Admission: EM | Admit: 2022-07-21 | Discharge: 2022-07-21 | Disposition: A | Discharge status: Home / Self Care | Payer: Medicare PPO | Attending: Emergency Medicine | Admitting: Emergency Medicine

## 2022-07-21 DIAGNOSIS — N184 Chronic kidney disease, stage 4 (severe): Secondary | ICD-10-CM | POA: Diagnosis not present

## 2022-07-21 DIAGNOSIS — I509 Heart failure, unspecified: Secondary | ICD-10-CM | POA: Insufficient documentation

## 2022-07-21 DIAGNOSIS — R0789 Other chest pain: Secondary | ICD-10-CM | POA: Diagnosis not present

## 2022-07-21 DIAGNOSIS — E119 Type 2 diabetes mellitus without complications: Secondary | ICD-10-CM | POA: Diagnosis not present

## 2022-07-21 DIAGNOSIS — R609 Edema, unspecified: Secondary | ICD-10-CM | POA: Diagnosis not present

## 2022-07-21 DIAGNOSIS — J449 Chronic obstructive pulmonary disease, unspecified: Secondary | ICD-10-CM | POA: Diagnosis not present

## 2022-07-21 DIAGNOSIS — I130 Hypertensive heart and chronic kidney disease with heart failure and stage 1 through stage 4 chronic kidney disease, or unspecified chronic kidney disease: Secondary | ICD-10-CM | POA: Insufficient documentation

## 2022-07-21 DIAGNOSIS — M7989 Other specified soft tissue disorders: Secondary | ICD-10-CM | POA: Diagnosis present

## 2022-07-21 DIAGNOSIS — R079 Chest pain, unspecified: Secondary | ICD-10-CM | POA: Diagnosis not present

## 2022-07-21 DIAGNOSIS — R0602 Shortness of breath: Secondary | ICD-10-CM | POA: Diagnosis not present

## 2022-07-21 DIAGNOSIS — J90 Pleural effusion, not elsewhere classified: Secondary | ICD-10-CM | POA: Diagnosis not present

## 2022-07-21 DIAGNOSIS — L03113 Cellulitis of right upper limb: Secondary | ICD-10-CM | POA: Diagnosis not present

## 2022-07-21 DIAGNOSIS — M25521 Pain in right elbow: Secondary | ICD-10-CM | POA: Diagnosis not present

## 2022-07-21 LAB — CBC
HCT: 31.6 % — ABNORMAL LOW (ref 39.0–52.0)
Hemoglobin: 9.7 g/dL — ABNORMAL LOW (ref 13.0–17.0)
MCH: 26.4 pg (ref 26.0–34.0)
MCHC: 30.7 g/dL (ref 30.0–36.0)
MCV: 85.9 fL (ref 80.0–100.0)
Platelets: 184 10*3/uL (ref 150–400)
RBC: 3.68 MIL/uL — ABNORMAL LOW (ref 4.22–5.81)
RDW: 14.8 % (ref 11.5–15.5)
WBC: 4 10*3/uL (ref 4.0–10.5)
nRBC: 0 % (ref 0.0–0.2)

## 2022-07-21 LAB — COMPREHENSIVE METABOLIC PANEL
ALT: 15 U/L (ref 0–44)
AST: 21 U/L (ref 15–41)
Albumin: 2.7 g/dL — ABNORMAL LOW (ref 3.5–5.0)
Alkaline Phosphatase: 61 U/L (ref 38–126)
Anion gap: 9 (ref 5–15)
BUN: 52 mg/dL — ABNORMAL HIGH (ref 8–23)
CO2: 19 mmol/L — ABNORMAL LOW (ref 22–32)
Calcium: 8.1 mg/dL — ABNORMAL LOW (ref 8.9–10.3)
Chloride: 107 mmol/L (ref 98–111)
Creatinine, Ser: 2.52 mg/dL — ABNORMAL HIGH (ref 0.61–1.24)
GFR, Estimated: 27 mL/min — ABNORMAL LOW (ref >60)
Glucose, Bld: 91 mg/dL (ref 70–99)
Potassium: 3.9 mmol/L (ref 3.5–5.1)
Sodium: 135 mmol/L (ref 135–145)
Total Bilirubin: 0.8 mg/dL (ref 0.3–1.2)
Total Protein: 6.7 g/dL (ref 6.5–8.1)

## 2022-07-21 LAB — TROPONIN I (HIGH SENSITIVITY): Troponin I (High Sensitivity): 10 ng/L (ref <18)

## 2022-07-21 MED ORDER — CEPHALEXIN 500 MG PO CAPS: 500.0000 mg | ORAL_CAPSULE | Freq: Three times a day (TID) | ORAL | 0 refills | Status: DC

## 2022-07-21 NOTE — Discharge Instructions (Addendum)
Call make a follow-up appointment with your primary care provider for recheck of your right elbow.  Antibiotics were sent to the pharmacy.  Begin taking today.  You may use warm moist compresses to your elbow as needed.

## 2022-07-21 NOTE — ED Provider Notes (Signed)
[facility] Provider Note    Event Date/Time   First MD Initiated Contact with Patient 07/21/22 1124     (approximate)   History   No chief complaint on file.   HPI  Larry Callahan is a 67 y.o. male   presents to the ED via EMS with complaint of a area on his right elbow that is swollen, red and draining.  He denies any fever and is unaware of any injury.  Patient states he thinks possibly it was an insect bite.  Also patient began complaining of some chest pain and shortness of breath.  Patient states this is similar to his past chest pain.  He has history of COPD, hypertension, atherosclerotic heart disease, sleep apnea, uncontrolled type 2 diabetes, chronic CHF, chronic kidney disease stage IV, atrial flutter, PAD, anemia secondary to CKD and elevated troponin level.      Physical Exam   Triage Vital Signs: ED Triage Vitals  Enc Vitals Group     BP 07/21/22 1037 136/68     Pulse Rate 07/21/22 1037 75     Resp 07/21/22 1037 18     Temp 07/21/22 1037 97.6 F (36.4 C)     Temp Source 07/21/22 1037 Oral     SpO2 07/21/22 1037 96 %     Weight 07/21/22 1040 229 lb (103.9 kg)     Height 07/21/22 1040 '5\' 11"'$  (1.803 m)     Head Circumference --      Peak Flow --      Pain Score 07/21/22 1040 7     Pain Loc --      Pain Edu? --      Excl. in [place]? --     Most recent vital signs: Vitals:   07/21/22 1037  BP: 136/68  Pulse: 75  Resp: 18  Temp: 97.6 F (36.4 C)  SpO2: 96%     General: Awake, no distress.  Talking in complete sentences without any noted shortness of breath. CV:  Good peripheral perfusion.  Resp:  Normal effort.  Abd:  No distention.  Other:  Right elbow posteriorly is mildly erythematous with minimal drainage noted.  Patient is still able to flex and extend without any difficulty.  No soft tissue edema appreciated especially at the bursa area.   ED Results / Procedures / Treatments   Labs (all labs ordered are  listed, but only abnormal results are displayed) Labs Reviewed  CBC - Abnormal; Notable for the following components:      Result Value   RBC 3.68 (*)    Hemoglobin 9.7 (*)    HCT 31.6 (*)    All other components within normal limits  COMPREHENSIVE METABOLIC PANEL - Abnormal; Notable for the following components:   CO2 19 (*)    BUN 52 (*)    Creatinine, Ser 2.52 (*)    Calcium 8.1 (*)    Albumin 2.7 (*)    GFR, Estimated 27 (*)    All other components within normal limits  TROPONIN I (HIGH SENSITIVITY)  TROPONIN I (HIGH SENSITIVITY)     EKG  Vent. rate 66 BPM PR interval * ms QRS duration 90 ms QT/QTcB 410/429 ms P-R-T axes 265 11 -5 Atrial flutter with variable A-V block with premature ventricular or aberrantly conducted complexes Nonspecific T wave abnormality Abnormal ECG When compared with ECG of 14-Jul-2022 21:30,   RADIOLOGY X-ray images were reviewed by me and radiology report is negative for acute changes.  PROCEDURES:  Critical Care performed:   ED EKG  Date/Time: 07/21/2022 2:09 PM  Performed by: Johnn Hai, PA-C Authorized by: Marquette Old, PA-C      MEDICATIONS ORDERED IN ED: Medications - No data to display   IMPRESSION / MDM / [place] / ED COURSE  I reviewed the triage vital signs and the nursing notes.   Differential diagnosis includes, but is not limited to, cellulitis right elbow, olecranon bursitis, septic joint, abscess.  67 year old male presents to the ED with complaint of right elbow pain and some occasional drainage from the area.  He denies any injury to his right elbow.  While he is being seen he mentions that he is short of breath but is able to talk in complete sentences without any difficulty and reports that this is not different than his normal chest pain and shortness of breath that he is experiencing with his multiple medical problems.  EKG was compared with a previous EKG recently with no changes.   Chest x-ray was unremarkable for any acute cardiopulmonary changes.  Lab work was reassuring after comparison with his frequent labs in which  he continues to have a BUN and creatinine that are elevated and hemoglobin was 9.7 for his chronic anemia secondary to his kidney disease.  Right elbow was then dressed and patient was made aware that most likely this is an early cellulitis that he should watch carefully.  We discussed using warm moist compresses but not hot to burn his skin.  A prescription for Keflex 500 mg 3 times daily was sent to the pharmacy as he has taken this in the past without any difficulties.  He is strongly encouraged to follow-up with his PCP if any continued problems or concerns.      Patient's presentation is most consistent with acute complicated illness / injury requiring diagnostic workup.  FINAL CLINICAL IMPRESSION(S) / ED DIAGNOSES   Final diagnoses:  Cellulitis of right elbow     Rx / DC Orders   ED Discharge Orders          Ordered    cephALEXin (KEFLEX) 500 MG capsule  3 times daily        07/21/22 1418             Note:  This document was prepared using Dragon voice recognition software and may include unintentional dictation errors.   Johnn Hai, PA-C 07/21/22 1516    Carrie Mew, MD 07/21/22 [phone]

## 2022-07-21 NOTE — ED Triage Notes (Signed)
First nurse note:  Pt here via [facility] with c/o of insect bite to R elbow. Pt states she had no ride so he called 911 to bring him here.   CBG 124 VSS per EMS

## 2022-07-21 NOTE — ED Triage Notes (Signed)
Pt in from home via EMS due to wound at R elbow; swelling and redness noted. Pt denies fever. May be abscess.

## 2022-07-21 NOTE — ED Notes (Signed)
Pt in bathroom

## 2022-07-22 ENCOUNTER — Telehealth: Payer: Self-pay

## 2022-07-22 NOTE — Telephone Encounter (Signed)
        Patient  visited [facility] on 2/2    Telephone encounter attempt :  1st  A HIPAA compliant voice message was left requesting a return call.  Instructed patient to call back .   [facility] [phone] [address], [place], [place] [address] Phone: [phone] Email: [email or web address]

## 2022-07-23 DIAGNOSIS — I5043 Acute on chronic combined systolic (congestive) and diastolic (congestive) heart failure: Secondary | ICD-10-CM | POA: Diagnosis not present

## 2022-07-25 ENCOUNTER — Telehealth: Payer: Self-pay

## 2022-07-25 ENCOUNTER — Telehealth: Payer: Self-pay | Admitting: Nurse Practitioner

## 2022-07-25 MED ORDER — DIAZEPAM 10 MG PO TABS: 10.0000 mg | ORAL_TABLET | Freq: Once | ORAL | 0 refills | Status: AC

## 2022-07-25 NOTE — Addendum Note (Signed)
Addended by: Hollice Espy on: 07/25/2022 02:42 PM   Modules accepted: Orders

## 2022-07-25 NOTE — Telephone Encounter (Signed)
Received call patient. Stating he is having chest pain. I advised patient to go to ED. He refused stating he will not have ride home.He said he will just keep 07/26/22 appointment-[name]

## 2022-07-25 NOTE — Telephone Encounter (Signed)
        Patient  visited [facility] on 2/2    Telephone encounter attempt :  2nd  Unable to leave a message     [place], [place] [address], [place], [place] [address] Phone: [phone] Email: [email or web address]

## 2022-07-25 NOTE — Telephone Encounter (Signed)
Script sent  Hollice Espy, MD

## 2022-07-26 ENCOUNTER — Encounter: Payer: Self-pay | Admitting: Nurse Practitioner

## 2022-07-26 ENCOUNTER — Ambulatory Visit (INDEPENDENT_AMBULATORY_CARE_PROVIDER_SITE_OTHER): Payer: Medicare PPO | Admitting: Nurse Practitioner

## 2022-07-26 ENCOUNTER — Emergency Department
Admission: EM | Admit: 2022-07-26 | Discharge: 2022-07-26 | Disposition: A | Discharge status: Home / Self Care | Payer: Medicare PPO | Attending: Emergency Medicine | Admitting: Emergency Medicine

## 2022-07-26 VITALS — BP 103/60 | HR 70 | Temp 96.9°F | Resp 16 | Ht 71.0 in | Wt 228.6 lb

## 2022-07-26 DIAGNOSIS — L03113 Cellulitis of right upper limb: Secondary | ICD-10-CM

## 2022-07-26 DIAGNOSIS — M79603 Pain in arm, unspecified: Secondary | ICD-10-CM | POA: Diagnosis not present

## 2022-07-26 DIAGNOSIS — N1832 Chronic kidney disease, stage 3b: Secondary | ICD-10-CM | POA: Diagnosis not present

## 2022-07-26 DIAGNOSIS — R7989 Other specified abnormal findings of blood chemistry: Secondary | ICD-10-CM | POA: Diagnosis not present

## 2022-07-26 DIAGNOSIS — Z8673 Personal history of transient ischemic attack (TIA), and cerebral infarction without residual deficits: Secondary | ICD-10-CM | POA: Insufficient documentation

## 2022-07-26 DIAGNOSIS — I2510 Atherosclerotic heart disease of native coronary artery without angina pectoris: Secondary | ICD-10-CM | POA: Diagnosis not present

## 2022-07-26 DIAGNOSIS — E1122 Type 2 diabetes mellitus with diabetic chronic kidney disease: Secondary | ICD-10-CM | POA: Insufficient documentation

## 2022-07-26 DIAGNOSIS — M25521 Pain in right elbow: Secondary | ICD-10-CM | POA: Diagnosis present

## 2022-07-26 DIAGNOSIS — S51001D Unspecified open wound of right elbow, subsequent encounter: Secondary | ICD-10-CM

## 2022-07-26 DIAGNOSIS — J449 Chronic obstructive pulmonary disease, unspecified: Secondary | ICD-10-CM | POA: Insufficient documentation

## 2022-07-26 DIAGNOSIS — I130 Hypertensive heart and chronic kidney disease with heart failure and stage 1 through stage 4 chronic kidney disease, or unspecified chronic kidney disease: Secondary | ICD-10-CM | POA: Insufficient documentation

## 2022-07-26 DIAGNOSIS — I509 Heart failure, unspecified: Secondary | ICD-10-CM | POA: Insufficient documentation

## 2022-07-26 MED ORDER — MUPIROCIN 2 % EX OINT: 1.0000 | TOPICAL_OINTMENT | Freq: Every day | CUTANEOUS | 0 refills | Status: DC

## 2022-07-26 MED ORDER — OXYCODONE-ACETAMINOPHEN 5-325 MG PO TABS
1.0000 | ORAL_TABLET | Freq: Once | ORAL | Status: AC
  Administered 2022-07-26: 1 via ORAL
  Filled 2022-07-26: qty 1

## 2022-07-26 MED ORDER — DOXYCYCLINE HYCLATE 100 MG PO TABS
100.0000 mg | ORAL_TABLET | Freq: Once | ORAL | Status: AC
  Administered 2022-07-26: 100 mg via ORAL
  Filled 2022-07-26: qty 1

## 2022-07-26 MED ORDER — DOXYCYCLINE MONOHYDRATE 100 MG PO TABS: 100.0000 mg | ORAL_TABLET | Freq: Two times a day (BID) | ORAL | 0 refills | Status: DC

## 2022-07-26 NOTE — ED Triage Notes (Signed)
Pt arrived via EMS from home due to wound on right elbow. Pt sts that he is seeing wound care for it however pt sts that it is hurting and bleeding.

## 2022-07-26 NOTE — ED Provider Notes (Signed)
[facility] Provider Note    Event Date/Time   First MD Initiated Contact with Patient 07/26/22 1942     (approximate)   History   Wound Infection   HPI  Larry Callahan is a 67 y.o. male  with pmh atrial flutter, CHF, coronary artery disease, diabetes, hypertension, hyperlipidemia presents for reevaluation of right elbow infection.  Seen in the ED on 2/8 for right elbow pain and drainage.  At that time he was started on Keflex.  He followed up with his primary care doctor today she prescribed mupirocin referred him to wound care.  Patient returns to the ED because his sister told him she thought he needed IV antibiotics.  He is having ongoing pain does complain of some chills and has had vomiting today.  Complains of wheezing with this is a chronic issue for him.  His wound care appointment is on March 4.     Past Medical History:  Diagnosis Date   Allergy    Atrial flutter ([place])    a. Dx 12/2020--CHA2DS2VASc = 5-->eliquis.   Chronic combined systolic (congestive) and diastolic (congestive) heart failure ([place])    a. 04/2019 Echo: EF 45-50%; b. 02/2019 Echo: EF 55-60%; c. 09/2020 Echo: EF 35-40%, glob HK. Nl RV size/fxn. Mild MR; d. 07/2021 Echo: EF 40-45%, mild LVH, low-nl RV fxn, no MR.   CKD (chronic kidney disease), stage III (HCC)    COPD (chronic obstructive pulmonary disease) ([place])    Coronary artery disease    a. 2011 Cath: nonobs dzs; b. 09/2020 MV: EF 38%. No signif ischemia. ? inf MI vs diaph attenuation. GI uptake artifact.   Diabetes mellitus without complication (HCC)    GERD (gastroesophageal reflux disease)    History of CVA (cerebrovascular accident) 09/25/2020   Hyperlipidemia    Hypertension    Morbid (severe) obesity due to excess calories ([place]) 06/09/2017   Morbid obesity ([place])    NICM (nonischemic cardiomyopathy) ([place])    a. 04/2019 Echo: EF 45-50%; b. 02/2019 Echo: EF 55-60%; c. 09/2020 Echo: EF 35-40%, glob HK; d. 09/2020 MV: No  ischemia. ? inf infarct vs attenuation; d. 07/2021 Echo: EF 40-45%.   OSA (obstructive sleep apnea)    Pneumonia due to COVID-19 virus 01/30/2020   Stage 3b chronic kidney disease ([place])    Syncope and collapse 11/16/2020    Patient Active Problem List   Diagnosis Date Noted   Upper GI bleed    Dizziness and giddiness 99991111   Umbilical hernia 99991111   Acute on chronic systolic CHF (congestive heart failure) ([place]) 08/05/2021   Acquired thrombophilia ([place]) 06/28/2021   Anemia in chronic kidney disease (CKD) 06/28/2021   PAD (peripheral artery disease) ([place])    Cellulitis of right arm 03/02/2021   Atrial flutter ([place]) 01/07/2021   Demand ischemia    Influenza A 10/30/2020   HLD (hyperlipidemia) 10/06/2020   Gastroesophageal reflux disease without esophagitis 04/19/2020   Elevated troponin I level 01/29/2020   Hematemesis 01/09/2020   Hospital discharge follow-up 11/26/2019   Obesity (BMI 30-39.9) 08/08/2019   Acute right hip pain 06/17/2019   Inability to ambulate due to hip 06/17/2019   CKD (chronic kidney disease), stage IV (HCC)    Hypervolemia    Full code status 05/20/2019   Pleuritic chest pain    Acute kidney injury superimposed on CKD 3b ([place]) 05/09/2019   Acute on chronic heart failure with preserved ejection fraction (HFpEF) (HCC)    Chronic systolic CHF (congestive  heart failure) ([place]) 03/01/2019   Iron deficiency anemia 12/31/2018   Atopic dermatitis 11/24/2018   Screening for colon cancer 11/06/2017   Uncontrolled type 2 diabetes mellitus with hyperglycemia ([place]) 11/06/2017   Hidradenitis 06/09/2017   Atherosclerotic heart disease of native coronary artery without angina pectoris 06/09/2017   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   Obstructive sleep apnea, adult 06/09/2017   Cigarette nicotine dependence with nicotine-induced disorder 06/09/2017   Diabetic polyneuropathy associated with type 2 diabetes mellitus ([place])  06/09/2017   Allergic rhinitis due to pollen 06/09/2017   Mixed hyperlipidemia 06/09/2017   COPD (chronic obstructive pulmonary disease) ([place]) 06/09/2017   Wheezing 06/09/2017   Dysuria 06/09/2017   Essential hypertension 06/09/2017   Personal history of other malignant neoplasm of kidney 06/09/2017   Pain in right hip 06/09/2017   Secondary hyperparathyroidism, not elsewhere classified ([place]) 06/09/2017   Tinea corporis 06/09/2017     Physical Exam  Triage Vital Signs: ED Triage Vitals  Enc Vitals Group     BP 07/26/22 1930 (!) 147/103     Pulse Rate 07/26/22 1930 66     Resp 07/26/22 1930 18     Temp 07/26/22 1930 98.4 F (36.9 C)     Temp Source 07/26/22 1930 Oral     SpO2 07/26/22 1930 98 %     Weight 07/26/22 1931 227 lb 1.2 oz (103 kg)     Height --      Head Circumference --      Peak Flow --      Pain Score 07/26/22 1931 7     Pain Loc --      Pain Edu? --      Excl. in [place]? --     Most recent vital signs: Vitals:   07/26/22 1930  BP: (!) 147/103  Pulse: 66  Resp: 18  Temp: 98.4 F (36.9 C)  SpO2: 98%     General: Awake, no distress.  CV:  Good peripheral perfusion.  Resp:  Normal effort.  Abd:  No distention.  Neuro:             Awake, Alert, Oriented x 3  Other:  Area of skin breakdown over the olecranon about the size of a quarter with central area that has exudate but no purulence draining.  Skin is somewhat macerated but not clearly cellulitic No significant elbow swelling or clear effusion Patient able to flex and extend the elbow without pain   ED Results / Procedures / Treatments  Labs (all labs ordered are listed, but only abnormal results are displayed) Labs Reviewed - No data to display   EKG     RADIOLOGY    PROCEDURES:  Critical Care performed: No  Procedures     MEDICATIONS ORDERED IN ED: Medications  doxycycline (VIBRA-TABS) tablet 100 mg (has no administration in time range)  oxyCODONE-acetaminophen  (PERCOCET/ROXICET) 5-325 MG per tablet 1 tablet (has no administration in time range)     IMPRESSION / MDM / ASSESSMENT AND PLAN / ED COURSE  I reviewed the triage vital signs and the nursing notes.                              Patient's presentation is most consistent with acute, uncomplicated illness.  Differential diagnosis includes, but is not limited to, cellulitis, septic bursitis with draining sinus, pressure injury, exam not consistent with septic joint  Patient is a 67 year old  male with multiple chronic medical comorbidities presents today because of right elbow pain and drainage.  Was seen in the ED on 2/8 and treated for cellulitis with Keflex which she is been compliant with.  Has been dressing the wound and followed up with his primary care doctor today.  Per the note the dressing was changed in the office and she prescribed mupirocin recommended he continue the Keflex.  Presents today to the ED because his sister encouraged him to, she thought he may need IV antibiotics.  Patient is complaining of some cold chills as well as nausea and vomiting today as well as pain in the elbow.  On exam overall he looks well his vitals are reassuring he is afebrile satting well.  He has an area just over the olecranon does look somewhat macerated and there is a central area of some exudate but no clear purulence.  The skin looks more macerated as opposed to cellulitic and I question whether there is a component of a pressure injury to this area.  I am able to range the elbow without difficulty and there is no pain with this so I have low suspicion that this is intra-articular.  Patient tells me he has not been putting pressure on the area but does look me like possible pressure injury.  This could be sequela of a septic bursitis but I would expect there to be more swelling and pain there is really minimal swelling.  Could just be a purulent cellulitis.  At this point based on how it appears clinically I  do not think that he needs admission for IV antibiotics.  Given the reported drainage and the exudate I am seeing will switch antibiotic from Keflex to doxycycline for coverage of MRSA.  Encourage patient to avoid putting any pressure on the elbow and to continue dressing it daily and use the mupirocin prescribed by his primary care provider.  We discussed following up with the wound care clinic.  Otherwise I think patient can be discharged.     FINAL CLINICAL IMPRESSION(S) / ED DIAGNOSES   Final diagnoses:  Cellulitis of right upper extremity     Rx / DC Orders   ED Discharge Orders          Ordered    doxycycline (ADOXA) 100 MG tablet  2 times daily        07/26/22 2031             Note:  This document was prepared using Dragon voice recognition software and may include unintentional dictation errors.   Rada Hay, MD 07/26/22 2032

## 2022-07-26 NOTE — Progress Notes (Signed)
[facility], [place] [address]  Internal MEDICINE  Office Visit Note  Patient Name: Larry Callahan  [id number]  [id number]  Date of Service: 07/26/2022  Chief Complaint  Patient presents with   Follow-up    ED f/u right elbow infection     HPI [place] presents for a follow-up visit for elbow infection  Went to ED for cellulitis and was treated with cephalexin and applied a dressing to it.  Changed bandage today       Current Medication: Outpatient Encounter Medications as of 07/26/2022  Medication Sig   Accu-Chek Softclix Lancets lancets Use to check blood sugars 4 times a day   E11.65   apixaban (ELIQUIS) 5 MG TABS tablet Take 1 tablet (5 mg total) by mouth 2 (two) times daily. Must make appt with Dr. Rockey Situ for refills   Blood Glucose Monitoring Suppl (TRUE METRIX AIR GLUCOSE METER) DEVI 1 Device by Does not apply route 2 (two) times daily.   Budeson-Glycopyrrol-Formoterol (BREZTRI AEROSPHERE) 160-9-4.8 MCG/ACT AERO Inhale 2 puffs into the lungs 2 (two) times daily.   carvedilol (COREG) 12.5 MG tablet Take 1 tablet (12.5 mg total) by mouth 2 (two) times daily.   cephALEXin (KEFLEX) 500 MG capsule Take 1 capsule (500 mg total) by mouth 3 (three) times daily.   cyanocobalamin 1000 MCG tablet Take 1 tablet (1,000 mcg total) by mouth daily.   furosemide (LASIX) 40 MG tablet Take 1 tablet (40 mg total) by mouth daily. Take lasix '40mg'$  po daily   glimepiride (AMARYL) 2 MG tablet Take 1 tablet (2 mg total) by mouth daily.   glucose blood (ACCU-CHEK GUIDE) test strip Use to check blood sugars 4 times a day   E11.65   hydrALAZINE (APRESOLINE) 100 MG tablet Take 1 tablet (100 mg total) by mouth 3 (three) times daily.   insulin glargine, 2 Unit Dial, (TOUJEO MAX SOLOSTAR) 300 UNIT/ML Solostar Pen Inject 20 units in am   Insulin Pen Needle (NOVOFINE PEN NEEDLE) 32G X 6 MM MISC Use as directed with toujeo pen   ipratropium-albuterol (DUONEB) 0.5-2.5  (3) MG/3ML SOLN Take 3 mLs by nebulization every 6 (six) hours as needed.   methocarbamol (ROBAXIN) 500 MG tablet Take 1 tablet (500 mg total) by mouth every 6 (six) hours as needed for muscle spasms.   mupirocin ointment (BACTROBAN) 2 % Apply 1 Application topically daily. To wound on elbow until healed.   omeprazole (PRILOSEC) 40 MG capsule Take 1 capsule once in the morning and once in the evening   potassium chloride (KLOR-CON) 20 MEQ packet Take 20 mEq by mouth daily.   rosuvastatin (CRESTOR) 10 MG tablet Take 1 tablet (10 mg total) by mouth at bedtime.   TOUJEO SOLOSTAR 300 UNIT/ML Solostar Pen Inject 26 Units into the skin daily.   No facility-administered encounter medications on file as of 07/26/2022.    Surgical History: Past Surgical History:  Procedure Laterality Date   BACK SURGERY     CARDIAC CATHETERIZATION     CHOLECYSTECTOMY     COLONOSCOPY WITH PROPOFOL N/A 04/12/2019   Procedure: COLONOSCOPY WITH PROPOFOL;  Surgeon: [name], MD;  Location: [facility] ENDOSCOPY;  Service: Gastroenterology;  Laterality: N/A;   ESOPHAGOGASTRODUODENOSCOPY N/A 04/12/2019   Procedure: ESOPHAGOGASTRODUODENOSCOPY (EGD);  Surgeon: [name], MD;  Location: [facility] ENDOSCOPY;  Service: Gastroenterology;  Laterality: N/A;   ESOPHAGOGASTRODUODENOSCOPY (EGD) WITH PROPOFOL N/A 11/21/2019   Procedure: ESOPHAGOGASTRODUODENOSCOPY (EGD) WITH PROPOFOL;  Surgeon: [name], MD;  Location: [name] ENDOSCOPY;  Service: Gastroenterology;  Laterality: N/A;   ESOPHAGOGASTRODUODENOSCOPY (EGD) WITH PROPOFOL N/A 01/11/2022   Procedure: ESOPHAGOGASTRODUODENOSCOPY (EGD) WITH PROPOFOL;  Surgeon: [name], MD;  Location: [name] ENDOSCOPY;  Service: Endoscopy;  Laterality: N/A;   FLEXIBLE BRONCHOSCOPY Bilateral 05/17/2019   Procedure: FLEXIBLE BRONCHOSCOPY;  Surgeon: [name], MD;  Location: [name] ORS;  Service: Pulmonary;  Laterality: Bilateral;   left arm surgery     nephrectomy Left     PARATHYROIDECTOMY     RIGHT HEART CATH N/A 03/29/2019   Procedure: RIGHT HEART CATH;  Surgeon: [name], MD;  Location: [facility] CV LAB;  Service: Cardiovascular;  Laterality: N/A;    Medical History: Past Medical History:  Diagnosis Date   Allergy    Atrial flutter ([place])    a. Dx 12/2020--CHA2DS2VASc = 5-->eliquis.   Chronic combined systolic (congestive) and diastolic (congestive) heart failure ([place])    a. 04/2019 Echo: EF 45-50%; b. 02/2019 Echo: EF 55-60%; c. 09/2020 Echo: EF 35-40%, glob HK. Nl RV size/fxn. Mild MR; d. 07/2021 Echo: EF 40-45%, mild LVH, low-nl RV fxn, no MR.   CKD (chronic kidney disease), stage III (HCC)    COPD (chronic obstructive pulmonary disease) ([place])    Coronary artery disease    a. 2011 Cath: nonobs dzs; b. 09/2020 MV: EF 38%. No signif ischemia. ? inf MI vs diaph attenuation. GI uptake artifact.   Diabetes mellitus without complication (HCC)    GERD (gastroesophageal reflux disease)    History of CVA (cerebrovascular accident) 09/25/2020   Hyperlipidemia    Hypertension    Morbid (severe) obesity due to excess calories ([place]) 06/09/2017   Morbid obesity ([place])    NICM (nonischemic cardiomyopathy) ([place])    a. 04/2019 Echo: EF 45-50%; b. 02/2019 Echo: EF 55-60%; c. 09/2020 Echo: EF 35-40%, glob HK; d. 09/2020 MV: No ischemia. ? inf infarct vs attenuation; d. 07/2021 Echo: EF 40-45%.   OSA (obstructive sleep apnea)    Pneumonia due to COVID-19 virus 01/30/2020   Stage 3b chronic kidney disease ([place])    Syncope and collapse 11/16/2020    Family History: Family History  Problem Relation Age of Onset   Diabetes Mother        2003   Lung cancer Father    Diabetes Sister    Hypertension Sister    Heart disease Sister    Cancer Sister    Bone cancer Brother    Prostate cancer Neg Hx    Bladder Cancer Neg Hx    Kidney cancer Neg Hx     Social History   Socioeconomic History   Marital status: Single    Spouse name: Not on file   Number of  children: Not on file   Years of education: Not on file   Highest education level: Not on file  Occupational History   Not on file  Tobacco Use   Smoking status: Former    Types: Cigarettes   Smokeless tobacco: Former    Types: Nurse, children's Use: Never used  Substance and Sexual Activity   Alcohol use: No   Drug use: No   Sexual activity: Not Currently  Other Topics Concern   Not on file  Social History Narrative   Not on file   Social Determinants of Health   Financial Resource Strain: Low Risk  (05/12/2021)   Overall Financial Resource Strain (CARDIA)    Difficulty of Paying Living Expenses: Not very hard  Food  Insecurity: Not on file  Transportation Needs: Not on file  Physical Activity: Not on file  Stress: Not on file  Social Connections: Not on file  Intimate Partner Violence: Not on file      Review of Systems  Vital Signs: BP 103/60   Pulse 70   Temp (!) 96.9 F (36.1 C)   Resp 16   Ht '5\' 11"'$  (1.803 m)   Wt 228 lb 9.6 oz (103.7 kg)   SpO2 96%   BMI 31.88 kg/m    Physical Exam Vitals reviewed.  Constitutional:      General: He is not in acute distress.    Appearance: Normal appearance. He is obese. He is not ill-appearing.  HENT:     Head: Normocephalic and atraumatic.  Eyes:     Pupils: Pupils are equal, round, and reactive to light.  Cardiovascular:     Rate and Rhythm: Normal rate and regular rhythm.  Pulmonary:     Effort: Pulmonary effort is normal. No respiratory distress.  Skin:    Findings: Wound present.     Comments: Wound to right elbow, partial thickness, with yellow wound bed.    Neurological:     Mental Status: He is alert and oriented to person, place, and time.  Psychiatric:        Mood and Affect: Mood normal.        Behavior: Behavior normal.        Assessment/Plan: 1. Cellulitis of right elbow Finish antibiotic and referred to wound clinic for further assessment and treatment.  - Ambulatory referral  to Wound Clinic  2. Elbow wound, right, subsequent encounter Topical antibiotic prescribed and referred to wound clinic for further assessment and treatment.  - mupirocin ointment (BACTROBAN) 2 %; Apply 1 Application topically daily. To wound on elbow until healed.  Dispense: 30 g; Refill: 0 - Ambulatory referral to Wound Clinic   General Counseling: win raymundo understanding of the findings of todays visit and agrees with plan of treatment. I have discussed any further diagnostic evaluation that may be needed or ordered today. We also reviewed his medications today. he has been encouraged to call the office with any questions or concerns that should arise related to todays visit.    Orders Placed This Encounter  Procedures   Ambulatory referral to [place] ordered this encounter  Medications   mupirocin ointment (BACTROBAN) 2 %    Sig: Apply 1 Application topically daily. To wound on elbow until healed.    Dispense:  30 g    Refill:  0    Fill today please    Return if symptoms worsen or fail to improve.   Total time spent:20 Minutes Time spent includes review of chart, medications, test results, and follow up plan with the patient.   [place] Controlled Substance Database was reviewed by me.  This patient was seen by [name], FNP-C in collaboration with Dr. Clayborn Bigness as a part of collaborative care agreement.   Idalis Hoelting R. Valetta Fuller, MSN, FNP-C Internal medicine

## 2022-07-26 NOTE — Discharge Instructions (Addendum)
Please continue to dress your wound and use the mupirocin that was prescribed.  Please stop taking the Keflex and start taking doxycycline.  Please follow-up with the wound care clinic.  If your pain is worsening you develop fevers or you are not able to move the elbow please return to the emergency department.

## 2022-07-26 NOTE — ED Triage Notes (Signed)
EMS brings pt in from home; seen recently for rt elbow cellulitis

## 2022-07-26 NOTE — Patient Instructions (Signed)
Clean wound on right elbow with mild soap and water once daily  Apply mupirocin ointment to wound on right elbow once or twice daily until healed.  Expose to wait when up during the day.  Cover with bandage at night to prevent reinfection or worsening of wound.

## 2022-07-31 ENCOUNTER — Observation Stay
Admission: EM | Admit: 2022-07-31 | Discharge: 2022-08-03 | Disposition: A | Discharge status: Home / Self Care | Payer: Medicare PPO | Attending: Student | Admitting: Student

## 2022-07-31 ENCOUNTER — Other Ambulatory Visit: Payer: Self-pay

## 2022-07-31 ENCOUNTER — Emergency Department: Payer: Medicare PPO

## 2022-07-31 DIAGNOSIS — Z87891 Personal history of nicotine dependence: Secondary | ICD-10-CM | POA: Insufficient documentation

## 2022-07-31 DIAGNOSIS — Z1152 Encounter for screening for COVID-19: Secondary | ICD-10-CM | POA: Diagnosis not present

## 2022-07-31 DIAGNOSIS — S199XXA Unspecified injury of neck, initial encounter: Secondary | ICD-10-CM | POA: Diagnosis not present

## 2022-07-31 DIAGNOSIS — E1122 Type 2 diabetes mellitus with diabetic chronic kidney disease: Secondary | ICD-10-CM | POA: Insufficient documentation

## 2022-07-31 DIAGNOSIS — N1832 Chronic kidney disease, stage 3b: Secondary | ICD-10-CM | POA: Diagnosis not present

## 2022-07-31 DIAGNOSIS — K920 Hematemesis: Secondary | ICD-10-CM | POA: Diagnosis not present

## 2022-07-31 DIAGNOSIS — M47812 Spondylosis without myelopathy or radiculopathy, cervical region: Secondary | ICD-10-CM | POA: Diagnosis not present

## 2022-07-31 DIAGNOSIS — R079 Chest pain, unspecified: Secondary | ICD-10-CM

## 2022-07-31 DIAGNOSIS — E119 Type 2 diabetes mellitus without complications: Secondary | ICD-10-CM

## 2022-07-31 DIAGNOSIS — Z79899 Other long term (current) drug therapy: Secondary | ICD-10-CM | POA: Diagnosis not present

## 2022-07-31 DIAGNOSIS — R0602 Shortness of breath: Secondary | ICD-10-CM | POA: Insufficient documentation

## 2022-07-31 DIAGNOSIS — N2889 Other specified disorders of kidney and ureter: Secondary | ICD-10-CM | POA: Diagnosis not present

## 2022-07-31 DIAGNOSIS — D649 Anemia, unspecified: Secondary | ICD-10-CM | POA: Diagnosis not present

## 2022-07-31 DIAGNOSIS — I2510 Atherosclerotic heart disease of native coronary artery without angina pectoris: Secondary | ICD-10-CM | POA: Insufficient documentation

## 2022-07-31 DIAGNOSIS — K922 Gastrointestinal hemorrhage, unspecified: Secondary | ICD-10-CM | POA: Diagnosis not present

## 2022-07-31 DIAGNOSIS — S301XXA Contusion of abdominal wall, initial encounter: Secondary | ICD-10-CM | POA: Diagnosis not present

## 2022-07-31 DIAGNOSIS — I5023 Acute on chronic systolic (congestive) heart failure: Secondary | ICD-10-CM | POA: Insufficient documentation

## 2022-07-31 DIAGNOSIS — Z8673 Personal history of transient ischemic attack (TIA), and cerebral infarction without residual deficits: Secondary | ICD-10-CM | POA: Insufficient documentation

## 2022-07-31 DIAGNOSIS — L03113 Cellulitis of right upper limb: Secondary | ICD-10-CM | POA: Diagnosis present

## 2022-07-31 DIAGNOSIS — J449 Chronic obstructive pulmonary disease, unspecified: Secondary | ICD-10-CM | POA: Diagnosis not present

## 2022-07-31 DIAGNOSIS — Z7901 Long term (current) use of anticoagulants: Secondary | ICD-10-CM | POA: Diagnosis not present

## 2022-07-31 DIAGNOSIS — S0990XA Unspecified injury of head, initial encounter: Secondary | ICD-10-CM | POA: Diagnosis not present

## 2022-07-31 DIAGNOSIS — I4892 Unspecified atrial flutter: Secondary | ICD-10-CM | POA: Diagnosis present

## 2022-07-31 DIAGNOSIS — R1111 Vomiting without nausea: Secondary | ICD-10-CM | POA: Diagnosis not present

## 2022-07-31 DIAGNOSIS — J811 Chronic pulmonary edema: Secondary | ICD-10-CM | POA: Diagnosis not present

## 2022-07-31 DIAGNOSIS — I130 Hypertensive heart and chronic kidney disease with heart failure and stage 1 through stage 4 chronic kidney disease, or unspecified chronic kidney disease: Secondary | ICD-10-CM | POA: Insufficient documentation

## 2022-07-31 DIAGNOSIS — I10 Essential (primary) hypertension: Secondary | ICD-10-CM | POA: Diagnosis present

## 2022-07-31 DIAGNOSIS — M2578 Osteophyte, vertebrae: Secondary | ICD-10-CM | POA: Diagnosis not present

## 2022-07-31 DIAGNOSIS — Z794 Long term (current) use of insulin: Secondary | ICD-10-CM | POA: Insufficient documentation

## 2022-07-31 DIAGNOSIS — K219 Gastro-esophageal reflux disease without esophagitis: Secondary | ICD-10-CM

## 2022-07-31 LAB — COMPREHENSIVE METABOLIC PANEL
ALT: 18 U/L (ref 0–44)
AST: 29 U/L (ref 15–41)
Albumin: 2.6 g/dL — ABNORMAL LOW (ref 3.5–5.0)
Alkaline Phosphatase: 61 U/L (ref 38–126)
Anion gap: 5 (ref 5–15)
BUN: 52 mg/dL — ABNORMAL HIGH (ref 8–23)
CO2: 21 mmol/L — ABNORMAL LOW (ref 22–32)
Calcium: 8.4 mg/dL — ABNORMAL LOW (ref 8.9–10.3)
Chloride: 113 mmol/L — ABNORMAL HIGH (ref 98–111)
Creatinine, Ser: 2.51 mg/dL — ABNORMAL HIGH (ref 0.61–1.24)
GFR, Estimated: 27 mL/min — ABNORMAL LOW (ref >60)
Glucose, Bld: 130 mg/dL — ABNORMAL HIGH (ref 70–99)
Potassium: 4.4 mmol/L (ref 3.5–5.1)
Sodium: 139 mmol/L (ref 135–145)
Total Bilirubin: 1 mg/dL (ref 0.3–1.2)
Total Protein: 6.7 g/dL (ref 6.5–8.1)

## 2022-07-31 LAB — CBC WITH DIFFERENTIAL/PLATELET
Abs Immature Granulocytes: 0.02 10*3/uL (ref 0.00–0.07)
Basophils Absolute: 0 10*3/uL (ref 0.0–0.1)
Basophils Relative: 1 %
Eosinophils Absolute: 0 10*3/uL (ref 0.0–0.5)
Eosinophils Relative: 1 %
HCT: 26.4 % — ABNORMAL LOW (ref 39.0–52.0)
Hemoglobin: 8.3 g/dL — ABNORMAL LOW (ref 13.0–17.0)
Immature Granulocytes: 1 %
Lymphocytes Relative: 25 %
Lymphs Abs: 0.9 10*3/uL (ref 0.7–4.0)
MCH: 27.3 pg (ref 26.0–34.0)
MCHC: 31.4 g/dL (ref 30.0–36.0)
MCV: 86.8 fL (ref 80.0–100.0)
Monocytes Absolute: 0.2 10*3/uL (ref 0.1–1.0)
Monocytes Relative: 5 %
Neutro Abs: 2.3 10*3/uL (ref 1.7–7.7)
Neutrophils Relative %: 67 %
Platelets: 183 10*3/uL (ref 150–400)
RBC: 3.04 MIL/uL — ABNORMAL LOW (ref 4.22–5.81)
RDW: 15.9 % — ABNORMAL HIGH (ref 11.5–15.5)
WBC: 3.4 10*3/uL — ABNORMAL LOW (ref 4.0–10.5)
nRBC: 0 % (ref 0.0–0.2)

## 2022-07-31 LAB — RESP PANEL BY RT-PCR (RSV, FLU A&B, COVID)  RVPGX2
Influenza A by PCR: NEGATIVE
Influenza B by PCR: NEGATIVE
Resp Syncytial Virus by PCR: NEGATIVE
SARS Coronavirus 2 by RT PCR: NEGATIVE

## 2022-07-31 LAB — CBC
HCT: 27.6 % — ABNORMAL LOW (ref 39.0–52.0)
Hemoglobin: 8.6 g/dL — ABNORMAL LOW (ref 13.0–17.0)
MCH: 26.8 pg (ref 26.0–34.0)
MCHC: 31.2 g/dL (ref 30.0–36.0)
MCV: 86 fL (ref 80.0–100.0)
Platelets: 196 10*3/uL (ref 150–400)
RBC: 3.21 MIL/uL — ABNORMAL LOW (ref 4.22–5.81)
RDW: 15.9 % — ABNORMAL HIGH (ref 11.5–15.5)
WBC: 3.9 10*3/uL — ABNORMAL LOW (ref 4.0–10.5)
nRBC: 0 % (ref 0.0–0.2)

## 2022-07-31 LAB — TROPONIN I (HIGH SENSITIVITY): Troponin I (High Sensitivity): 10 ng/L (ref <18)

## 2022-07-31 LAB — APTT: aPTT: 51 seconds — ABNORMAL HIGH (ref 24–36)

## 2022-07-31 LAB — TYPE AND SCREEN
ABO/RH(D): A POS
Antibody Screen: NEGATIVE

## 2022-07-31 LAB — PROTIME-INR
INR: 1.7 — ABNORMAL HIGH (ref 0.8–1.2)
Prothrombin Time: 19.8 seconds — ABNORMAL HIGH (ref 11.4–15.2)

## 2022-07-31 LAB — BRAIN NATRIURETIC PEPTIDE: B Natriuretic Peptide: 230 pg/mL — ABNORMAL HIGH (ref 0.0–100.0)

## 2022-07-31 LAB — GLUCOSE, CAPILLARY: Glucose-Capillary: 168 mg/dL — ABNORMAL HIGH (ref 70–99)

## 2022-07-31 MED ORDER — ONDANSETRON HCL 4 MG/2ML IJ SOLN: 4.0000 mg | Freq: Four times a day (QID) | INTRAMUSCULAR | Status: DC | PRN

## 2022-07-31 MED ORDER — PANTOPRAZOLE SODIUM 40 MG IV SOLR
40.0000 mg | Freq: Once | INTRAVENOUS | Status: AC
  Administered 2022-07-31: 40 mg via INTRAVENOUS
  Filled 2022-07-31: qty 10

## 2022-07-31 MED ORDER — CARVEDILOL 12.5 MG PO TABS
12.5000 mg | ORAL_TABLET | Freq: Two times a day (BID) | ORAL | Status: DC
  Administered 2022-07-31 – 2022-08-03 (×6): 12.5 mg via ORAL
  Filled 2022-07-31 (×6): qty 1

## 2022-07-31 MED ORDER — POTASSIUM CHLORIDE 20 MEQ PO PACK: 20.0000 meq | PACK | Freq: Every day | ORAL | Status: DC

## 2022-07-31 MED ORDER — SODIUM CHLORIDE 0.9% FLUSH
3.0000 mL | Freq: Two times a day (BID) | INTRAVENOUS | Status: DC
  Administered 2022-07-31 – 2022-08-03 (×6): 3 mL via INTRAVENOUS

## 2022-07-31 MED ORDER — INSULIN GLARGINE-YFGN 100 UNIT/ML ~~LOC~~ SOLN
10.0000 [IU] | SUBCUTANEOUS | Status: DC
  Filled 2022-07-31: qty 0.1

## 2022-07-31 MED ORDER — POLYETHYLENE GLYCOL 3350 17 G PO PACK: 17.0000 g | PACK | Freq: Every day | ORAL | Status: DC | PRN

## 2022-07-31 MED ORDER — MOMETASONE FURO-FORMOTEROL FUM 200-5 MCG/ACT IN AERO
2.0000 | INHALATION_SPRAY | Freq: Two times a day (BID) | RESPIRATORY_TRACT | Status: DC
  Administered 2022-07-31 – 2022-08-03 (×6): 2 via RESPIRATORY_TRACT
  Filled 2022-07-31: qty 8.8

## 2022-07-31 MED ORDER — FUROSEMIDE 10 MG/ML IJ SOLN: 80.0000 mg | Freq: Every day | INTRAMUSCULAR | Status: DC

## 2022-07-31 MED ORDER — INSULIN ASPART 100 UNIT/ML IJ SOLN
0.0000 [IU] | Freq: Three times a day (TID) | INTRAMUSCULAR | Status: DC
  Administered 2022-08-01 – 2022-08-03 (×3): 2 [IU] via SUBCUTANEOUS
  Administered 2022-08-03: 1 [IU] via SUBCUTANEOUS
  Filled 2022-07-31 (×4): qty 1

## 2022-07-31 MED ORDER — ONDANSETRON HCL 4 MG PO TABS: 4.0000 mg | ORAL_TABLET | Freq: Four times a day (QID) | ORAL | Status: DC | PRN

## 2022-07-31 MED ORDER — FUROSEMIDE 10 MG/ML IJ SOLN
80.0000 mg | Freq: Once | INTRAMUSCULAR | Status: AC
  Administered 2022-07-31: 80 mg via INTRAVENOUS
  Filled 2022-07-31: qty 8

## 2022-07-31 MED ORDER — UMECLIDINIUM BROMIDE 62.5 MCG/ACT IN AEPB
1.0000 | INHALATION_SPRAY | Freq: Every day | RESPIRATORY_TRACT | Status: DC
  Filled 2022-07-31: qty 7

## 2022-07-31 MED ORDER — LIDOCAINE VISCOUS HCL 2 % MT SOLN: 15.0000 mL | Freq: Once | OROMUCOSAL | Status: DC

## 2022-07-31 MED ORDER — ACETAMINOPHEN 650 MG RE SUPP: 650.0000 mg | Freq: Four times a day (QID) | RECTAL | Status: DC | PRN

## 2022-07-31 MED ORDER — HYDRALAZINE HCL 50 MG PO TABS
100.0000 mg | ORAL_TABLET | Freq: Three times a day (TID) | ORAL | Status: DC
  Administered 2022-07-31 – 2022-08-03 (×7): 100 mg via ORAL
  Filled 2022-07-31 (×8): qty 2

## 2022-07-31 MED ORDER — ACETAMINOPHEN 325 MG PO TABS
650.0000 mg | ORAL_TABLET | Freq: Four times a day (QID) | ORAL | Status: DC | PRN
  Administered 2022-08-01 – 2022-08-03 (×4): 650 mg via ORAL
  Filled 2022-07-31 (×4): qty 2

## 2022-07-31 MED ORDER — ROSUVASTATIN CALCIUM 10 MG PO TABS
10.0000 mg | ORAL_TABLET | Freq: Every day | ORAL | Status: DC
  Administered 2022-07-31 – 2022-08-02 (×3): 10 mg via ORAL
  Filled 2022-07-31 (×3): qty 1

## 2022-07-31 MED ORDER — IPRATROPIUM-ALBUTEROL 0.5-2.5 (3) MG/3ML IN SOLN: 3.0000 mL | Freq: Four times a day (QID) | RESPIRATORY_TRACT | Status: DC | PRN

## 2022-07-31 MED ORDER — BUDESON-GLYCOPYRROL-FORMOTEROL 160-9-4.8 MCG/ACT IN AERO: 2.0000 | INHALATION_SPRAY | Freq: Two times a day (BID) | RESPIRATORY_TRACT | Status: DC

## 2022-07-31 MED ORDER — ONDANSETRON HCL 4 MG/2ML IJ SOLN
4.0000 mg | Freq: Once | INTRAMUSCULAR | Status: AC
  Administered 2022-07-31: 4 mg via INTRAVENOUS
  Filled 2022-07-31: qty 2

## 2022-07-31 NOTE — Assessment & Plan Note (Signed)
No wheezing on examination today  - Continue home bronchodilators - DuoNebs every 6 hours as needed

## 2022-07-31 NOTE — H&P (Addendum)
History and Physical    Patient: Larry Callahan [id number] DOB: June 19, 1955 DOA: 07/31/2022 DOS: the patient was seen and examined on 07/31/2022 PCP: [name], NP  Patient coming from: Home  Chief Complaint:  Chief Complaint  Patient presents with   Wound Check   HPI: Larry Callahan is a 67 y.o. male with medical history significant of CAD, COPD, CKD stage III, nonischemic cardiomyopathy (last EF 40-45%, 07/2021), hypertension, OSA, hyperlipidemia, who presents to the ED due to a wound check.  [name] states that he has been dealing with an infection of his right elbow for approximately 2 weeks now.  He was initially coming in today to have his wound checked as he is frustrated that it has not healed yet.  However, this morning he had sudden onset nausea with vomiting.  Initially his emesis contained food products only, however it shortly thereafter turned bloody.  He endorses chest pain that is central and he describes as "peppery."  Due to this, he called an ambulance.  He also endorses shortness of breath but did not notice that before he came in.  He notes that approximately 2 days ago, he rolled off his bed while sleeping but denies hitting her his head or any loss of consciousness.  He states he was able to stand up and get back in bed without any difficulty.  ED Course:  On arrival to the ED, patient was normotensive at 132/84 with heart rate of 82.  He was saturating at 100% on room air.  He was afebrile 97.5.  Initial workup remarkable for WBC of 3.4, hemoglobin of 8.3, platelets 183, potassium 4.4, bicarb 21, glucose 130, BUN 52, creatinine 2.51, and GFR of 27.  Initial troponin negative at 10.  BNP elevated at 230.  INR elevated 1.7 and PTT of 51. CT of the head and C-spine were obtained with no acute abnormalities.  Chest x-ray was obtained that demonstrated cardiac enlargement and pulmonary vascular congestion.  CT of the abdomen was obtained and still  pending.  Due to concern for upper GI bleed patient started on Protonix, and due to shortness of breath, patient given Lasix.  TRH contacted for admission.  Review of Systems: As mentioned in the history of present illness. All other systems reviewed and are negative.  Past Medical History:  Diagnosis Date   Allergy    Atrial flutter ([place])    a. Dx 12/2020--CHA2DS2VASc = 5-->eliquis.   Chronic combined systolic (congestive) and diastolic (congestive) heart failure ([place])    a. 04/2019 Echo: EF 45-50%; b. 02/2019 Echo: EF 55-60%; c. 09/2020 Echo: EF 35-40%, glob HK. Nl RV size/fxn. Mild MR; d. 07/2021 Echo: EF 40-45%, mild LVH, low-nl RV fxn, no MR.   CKD (chronic kidney disease), stage III (HCC)    COPD (chronic obstructive pulmonary disease) ([place])    Coronary artery disease    a. 2011 Cath: nonobs dzs; b. 09/2020 MV: EF 38%. No signif ischemia. ? inf MI vs diaph attenuation. GI uptake artifact.   Diabetes mellitus without complication (HCC)    GERD (gastroesophageal reflux disease)    History of CVA (cerebrovascular accident) 09/25/2020   Hyperlipidemia    Hypertension    Morbid (severe) obesity due to excess calories ([place]) 06/09/2017   Morbid obesity ([place])    NICM (nonischemic cardiomyopathy) ([place])    a. 04/2019 Echo: EF 45-50%; b. 02/2019 Echo: EF 55-60%; c. 09/2020 Echo: EF 35-40%, glob HK; d. 09/2020 MV: No ischemia. ? inf infarct vs  attenuation; d. 07/2021 Echo: EF 40-45%.   OSA (obstructive sleep apnea)    Pneumonia due to COVID-19 virus 01/30/2020   Stage 3b chronic kidney disease ([place])    Syncope and collapse 11/16/2020   Past Surgical History:  Procedure Laterality Date   BACK SURGERY     CARDIAC CATHETERIZATION     CHOLECYSTECTOMY     COLONOSCOPY WITH PROPOFOL N/A 04/12/2019   Procedure: COLONOSCOPY WITH PROPOFOL;  Surgeon: [name], MD;  Location: [facility] ENDOSCOPY;  Service: Gastroenterology;  Laterality: N/A;   ESOPHAGOGASTRODUODENOSCOPY N/A 04/12/2019   Procedure:  ESOPHAGOGASTRODUODENOSCOPY (EGD);  Surgeon: [name], MD;  Location[facility] ENDOSCOPY;  Service: Gastroenterology;  Laterality: N/A;   ESOPHAGOGASTRODUODENOSCOPY (EGD) WITH PROPOFOL N/A 11/21/2019   Procedure: ESOPHAGOGASTRODUODENOSCOPY (EGD) WITH PROPOFOL;  Surgeon: [name], MD;  Location: [facility] ENDOSCOPY;  Service: Gastroenterology;  Laterality: N/A;   ESOPHAGOGASTRODUODENOSCOPY (EGD) WITH PROPOFOL N/A 01/11/2022   Procedure: ESOPHAGOGASTRODUODENOSCOPY (EGD) WITH PROPOFOL;  Surgeon: [name], MD;  Location: [name] ENDOSCOPY;  Service: Endoscopy;  Laterality: N/A;   FLEXIBLE BRONCHOSCOPY Bilateral 05/17/2019   Procedure: FLEXIBLE BRONCHOSCOPY;  Surgeon: [name], MD;  Location: [name] ORS;  Service: Pulmonary;  Laterality: Bilateral;   left arm surgery     nephrectomy Left    PARATHYROIDECTOMY     RIGHT HEART CATH N/A 03/29/2019   Procedure: RIGHT HEART CATH;  Surgeon: [name], MD;  Location: [place] CV LAB;  Service: Cardiovascular;  Laterality: N/A;   Social History:  reports that he has quit smoking. His smoking use included cigarettes. He has quit using smokeless tobacco.  His smokeless tobacco use included chew. He reports that he does not drink alcohol and does not use drugs.  Allergies  Allergen Reactions   Penicillins Rash    Family History  Problem Relation Age of Onset   Diabetes Mother        2003   Lung cancer Father    Diabetes Sister    Hypertension Sister    Heart disease Sister    Cancer Sister    Bone cancer Brother    Prostate cancer Neg Hx    Bladder Cancer Neg Hx    Kidney cancer Neg Hx     Prior to Admission medications   Medication Sig Start Date End Date Taking? Authorizing Provider  Accu-Chek Softclix Lancets lancets Use to check blood sugars 4 times a day   E11.65 08/20/21   [name], NP  apixaban (ELIQUIS) 5 MG TABS tablet Take 1 tablet (5 mg total) by mouth 2 (two) times daily. Must make appt with Dr.  Rockey Situ for refills 02/09/22   [name], MD  Blood Glucose Monitoring Suppl (TRUE METRIX AIR GLUCOSE METER) DEVI 1 Device by Does not apply route 2 (two) times daily. 04/13/18   [name], NP  Budeson-Glycopyrrol-Formoterol (BREZTRI AEROSPHERE) 160-9-4.8 MCG/ACT AERO Inhale 2 puffs into the lungs 2 (two) times daily. 03/29/22   [name], NP  carvedilol (COREG) 12.5 MG tablet Take 1 tablet (12.5 mg total) by mouth 2 (two) times daily. 02/25/22   Alisa Graff, FNP  cephALEXin (KEFLEX) 500 MG capsule Take 1 capsule (500 mg total) by mouth 3 (three) times daily. 07/21/22   Johnn Hai, PA-C  cyanocobalamin 1000 MCG tablet Take 1 tablet (1,000 mcg total) by mouth daily. 07/15/22   [name], NP  doxycycline (ADOXA) 100 MG tablet Take 1 tablet (100 mg total) by mouth 2 (two) times daily for 7  days. 07/26/22 08/02/22  Rada Hay, MD  furosemide (LASIX) 40 MG tablet Take 1 tablet (40 mg total) by mouth daily. Take lasix 83m po daily 02/25/22   HDarylene PriceA, FNP  glimepiride (AMARYL) 2 MG tablet Take 1 tablet (2 mg total) by mouth daily. 03/29/22   AJonetta Osgood NP  glucose blood (ACCU-CHEK GUIDE) test strip Use to check blood sugars 4 times a day   E11.65 12/13/21   KLavera Guise MD  hydrALAZINE (APRESOLINE) 100 MG tablet Take 1 tablet (100 mg total) by mouth 3 (three) times daily. 06/14/22   KLavera Guise MD  insulin glargine, 2 Unit Dial, (TOUJEO MAX SOLOSTAR) 300 UNIT/ML Solostar Pen Inject 20 units in am 03/03/22   McDonough, LSi Gaul PA-C  Insulin Pen Needle (NOVOFINE PEN NEEDLE) 32G X 6 MM MISC Use as directed with toujeo pen 03/29/22   AJonetta Osgood NP  ipratropium-albuterol (DUONEB) 0.5-2.5 (3) MG/3ML SOLN Take 3 mLs by nebulization every 6 (six) hours as needed. 06/29/21   Danford, CSuann Larry MD  methocarbamol (ROBAXIN) 500 MG tablet Take 1 tablet (500 mg total) by mouth every 6 (six) hours as needed for muscle spasms. 06/16/22   AJonetta Osgood  NP  mupirocin ointment (BACTROBAN) 2 % Apply 1 Application topically daily. To wound on elbow until healed. 07/26/22   AJonetta Osgood NP  omeprazole (PRILOSEC) 40 MG capsule Take 1 capsule once in the morning and once in the evening 06/14/22   KLavera Guise MD  potassium chloride (KLOR-CON) 20 MEQ packet Take 20 mEq by mouth daily.    [provider]  rosuvastatin (CRESTOR) 10 MG tablet Take 1 tablet (10 mg total) by mouth at bedtime. 03/29/22   AJonetta Osgood NP  TOUJEO SOLOSTAR 300 UNIT/ML Solostar Pen Inject 26 Units into the skin daily. 06/09/22   KLavera Guise MD    Physical Exam: Vitals:   07/31/22 1108 07/31/22 1615 07/31/22 1625 07/31/22 1804  BP: 132/84   138/88  Pulse: 74 67  81  Resp: (!) 24 19  18  $ Temp: (!) 97.5 F (36.4 C)  97.6 F (36.4 C) 97.9 F (36.6 C)  TempSrc: Oral  Oral   SpO2: 100% 97%  99%   Physical Exam Vitals and nursing note reviewed.  Constitutional:      General: He is not in acute distress.    Appearance: He is obese. He is not toxic-appearing.  HENT:     Head: Normocephalic and atraumatic.     Mouth/Throat:     Mouth: Mucous membranes are moist.     Pharynx: Oropharynx is clear.  Eyes:     Conjunctiva/sclera: Conjunctivae normal.     Pupils: Pupils are equal, round, and reactive to light.  Cardiovascular:     Rate and Rhythm: Normal rate and regular rhythm.     Heart sounds: No murmur heard. Pulmonary:     Effort: Pulmonary effort is normal. No respiratory distress.     Breath sounds: Normal breath sounds. No wheezing, rhonchi or rales.  Abdominal:     General: Bowel sounds are normal. There is no distension.     Palpations: Abdomen is soft.     Tenderness: There is no abdominal tenderness. There is no guarding.  Musculoskeletal:     Right lower leg: 1+ Pitting Edema present.     Left lower leg: 1+ Pitting Edema present.  Skin:    General: Skin is warm and dry.     Findings: Bruising (  Large bruising noted on the left  lower back progressing around to the left lower quadrant of the abdomen and down towards his left thigh.  Nontender on palpation) present.     Comments: Approximately 2 cm healing wound on the right elbow with minimal surrounding erythema, with no tenderness to palpation or increased warmth to touch.  Neurological:     General: No focal deficit present.     Mental Status: He is alert and oriented to person, place, and time.  Psychiatric:        Mood and Affect: Mood normal.        Behavior: Behavior normal.         Data Reviewed: CBC with WBC of 3.4, hemoglobin of 8.3, MCV of 86 and platelets of 183 BMP with sodium 139, potassium 4.4, bicarb 21, glucose 130, BUN 52, creatinine 2.51, calcium 8.4, albumin 2.1, AST 29, ALT 18 and GFR 27 Troponin negative at 10 BNP elevated 230.  BNP obtained on 07/14/2022 within normal limits at 87. INR elevated at 1.7 PTT elevated at 51 COVID-19, influenza and RSV PCR negative  EKG personally reviewed.  Atrial flutter with variable AV block.  No ST or T wave changes concerning for acute ischemia.  CT ABDOMEN PELVIS WO CONTRAST  Result Date: 07/31/2022 CLINICAL DATA:  Left flank bruising. Evaluate for bleed. Patient also complains of vomiting this morning. EXAM: CT ABDOMEN AND PELVIS WITHOUT CONTRAST TECHNIQUE: Multidetector CT imaging of the abdomen and pelvis was performed following the standard protocol without IV contrast. RADIATION DOSE REDUCTION: This exam was performed according to the departmental dose-optimization program which includes automated exposure control, adjustment of the mA and/or kV according to patient size and/or use of iterative reconstruction technique. COMPARISON:  Diffuse severe parenchymal volume loss and mild fatty infiltration. The main pancreatic duct is not dilated. No peripancreatic fat stranding or fluid collection. FINDINGS: Lower chest: Trace pleural fluid bilaterally with adjacent dependent atelectasis. Calcified granuloma  in the right middle lobe. Hypodense blood pool, suggestive of anemia. Hepatobiliary: No focal liver abnormality is seen. Status post cholecystectomy. No biliary dilatation. Pancreas: Diffuse severe parenchymal atrophy and mild fatty infiltration. The main pancreatic duct is not dilated. No peripancreatic fat stranding or fluid collection. Spleen: Normal in size without focal abnormality. Multiple calcified granulomas scattered throughout the spleen. Adrenals/Urinary Tract: Left adrenal mass measures 8.6 x 5.6 cm, previously 8.3 x 6.0 cm on 02/22/2022 (series 2, image 25). Coarse calcification again noted along the edge of the mass. Right adrenal gland is unremarkable. Postoperative changes of left nephrectomy. Heterogeneous mass at the inferior pole of the right kidney, best visualized on sagittal and coronal series measures approximately 6.2 x 4.9 cm, not significantly changed compared to 02/22/2022 when measured in a similar fashion but increased in size compared to 12/10/2019 when it measured approximately 5.2 x 3.7 cm (series 6, image 58). No right hydronephrosis or nephrolithiasis. Several fluid density benign cortical cysts again noted in the right kidney. Mildly distended urinary bladder is unremarkable. Stomach/Bowel: Stomach is within normal limits. Appendix appears normal. Diverticulosis of the descending and sigmoid colon. No evidence of bowel wall thickening, distention, or inflammatory changes. Vascular/Lymphatic: Aortic atherosclerosis. No enlarged abdominal or pelvic lymph nodes. Reproductive: Prostate is unremarkable. Other: Fat containing 7.8 cm right umbilical hernia (series 2, image 60). A 5.7 cm left lumbar hernia contains a small knuckle of small bowel and fat (series 2, image 36). Mild subcutaneous fat stranding is noted in the region of the left flank. No  focal fluid collection. Musculoskeletal: A 2.1 x 2.0 cm sclerotic lesion with ill-defined margins is noted in the posterior aspect of the  T12 vertebral body (series 2, image 25). No acute osseous findings. Flowing anterior osteophytes in the visualized distal thoracic spine, consistent with diffuse idiopathic skeletal hyperostosis. Vacuum disc phenomenon at L2-L3 and L4-L5. IMPRESSION: 1. Mild subcutaneous fat stranding in the left flank without focal fluid collection. No CT findings on this unenhanced exam to suggest active bleed as queried. 2. A 6.2 cm heterogeneous mass at the inferior pole of the right kidney is not significantly changed compared to 02/22/2022 but is increased in size compared to 12/10/2019. This remains concerning for renal cell carcinoma. When the patient is clinically stable and able to follow directions and hold their breath (preferably as an outpatient) further evaluation with dedicated abdominal MRI should be considered. 3. Indeterminate 2.1 cm sclerotic lesion with ill-defined margins is noted in the T12 vertebral body. This is concerning for possible osseous metastatic disease. Recommend nuclear medicine bone scan for further evaluation. 4. Trace pleural fluid bilaterally with adjacent dependent atelectasis. 5. Hypodense blood pool, suggestive of anemia. 6. Other chronic findings as described in the body of the report. 7. Aortic atherosclerosis. Aortic Atherosclerosis (ICD10-I70.0). Electronically Signed   By: Ileana Roup M.D.   On: 07/31/2022 15:21   CT Cervical Spine Wo Contrast  Result Date: 07/31/2022 CLINICAL DATA:  Neck trauma. EXAM: CT CERVICAL SPINE WITHOUT CONTRAST TECHNIQUE: Multidetector CT imaging of the cervical spine was performed without intravenous contrast. Multiplanar CT image reconstructions were also generated. RADIATION DOSE REDUCTION: This exam was performed according to the departmental dose-optimization program which includes automated exposure control, adjustment of the mA and/or kV according to patient size and/or use of iterative reconstruction technique. COMPARISON:  05/04/2022 FINDINGS:  Alignment: Normal. Skull base and vertebrae: No acute fracture. No primary bone lesion or focal pathologic process. Soft tissues and spinal canal: No prevertebral fluid or swelling. No visible canal hematoma. Disc levels: Disc space narrowing with marginal osteophyte formation C2-3 through C5-6. Upper chest: Negative. IMPRESSION: Degenerative changes.  No acute traumatic abnormalities. Electronically Signed   By: Sammie Bench M.D.   On: 07/31/2022 14:51   CT HEAD WO CONTRAST (5MM)  Result Date: 07/31/2022 CLINICAL DATA:  Head trauma. EXAM: CT HEAD WITHOUT CONTRAST TECHNIQUE: Contiguous axial images were obtained from the base of the skull through the vertex without intravenous contrast. RADIATION DOSE REDUCTION: This exam was performed according to the departmental dose-optimization program which includes automated exposure control, adjustment of the mA and/or kV according to patient size and/or use of iterative reconstruction technique. COMPARISON:  05/16/2022 FINDINGS: Brain: There is periventricular white matter decreased attenuation consistent with small vessel ischemic changes. Gray-white differentiation is preserved. No acute intracranial hemorrhage, mass effect or shift. No hydrocephalus. Vascular: No hyperdense vessel or unexpected calcification. Skull: Normal. Negative for fracture or focal lesion. Sinuses/Orbits: Mucoperiosteal thickening consistent with chronic maxillary and ethmoid sinusitis. IMPRESSION: Periventricular white matter changes consistent with chronic small vessel ischemia. No acute intracranial process identified. Electronically Signed   By: Sammie Bench M.D.   On: 07/31/2022 14:48   DG Chest 2 View  Result Date: 07/31/2022 CLINICAL DATA:  Shortness of breath.  Wound to right elbow. EXAM: CHEST - 2 VIEW COMPARISON:  07/21/2022 FINDINGS: Cardiac enlargement. Aortic atherosclerosis. Pulmonary vascular congestion. Blunting of the costophrenic angle suggests small pleural  effusions. No airspace opacities identified. Degenerative disc disease noted within the thoracic spine. IMPRESSION: Cardiac enlargement and pulmonary  vascular congestion. Small bilateral pleural effusions suspected. Electronically Signed   By: Kerby Moors M.D.   On: 07/31/2022 13:07    There are no new results to review at this time.  Assessment and Plan:  * Acute upper GI bleed Patient notes nausea and vomiting that began suddenly this morning and turned bloody after several episodes of normal-appearing emesis.  Differential includes Mallory-Weiss, however given recent use of doxycycline, pill esophagitis is also on the differential.  No additional episodes since arrival in the ED and FOBT is negative.  - Hold doxycycline - Protonix 40 mg IV twice daily - One-time dose of viscous lidocaine - If additional episodes of emesis should occur, will consult GI at that time - CBC twice daily  Acute on chronic systolic CHF (congestive heart failure) ([place]) Patient endorsing shortness of breath with pitting edema on exam with BNP elevated at 230 compared to 84 2 weeks ago.  Evidence of congestion on chest x-ray.  Last echocardiogram obtained in 2023 with a EF of 40-45%  - Telemetry monitoring - Lasix 80 mg IV daily - Daily weights - Strict in and out - Continue home GDMT  Cellulitis of right arm Patient first presented to the ED on 07/21/2022 with an area of redness, swelling and drainage on the right elbow.  He was started on Keflex.  He was then seen by his PCP on 07/26/2022 and was referred to the wound care center.  He went to the ED on the same day and was started on doxycycline.  Given concern for possible pill esophagitis, will hold doxycycline at this time.  At this time, the wound does not appear to be infected, however he may be having sequelae of slow wound healing.  - Wound care consult placed  Chest pain Patient endorses chest pain that he believes began around the time of vomiting.   Low suspicion for cardiac etiology as troponin is negative and EKG is stable.  Likely related to vomiting episodes earlier today  -Viscous lidocaine ordered  Acute on chronic anemia Per chart review, patient's hemoglobin generally ranges between 9-11.  Currently 8.3.  Likely multifactorial in the setting of possible upper GI bleed and large left flank hematoma.  - Repeat CBC this evening - Transfuse for hemoglobin less than 7  Hematoma of left flank Patient presenting with large left flank hematoma after rolling off of his bed while sleeping.  CT with subcutaneous fat stranding but no focal fluid collection.  -Monitor CBC as noted above  Renal mass CT imaging obtained today demonstrated increased size of heterogenous mass in the inferior pole of the right kidney compared to 2021.  Concerning for underlying renal cell carcinoma.  In addition, there is evidence of a 2.1 cm sclerotic lesion in the T12 vertebrae.  Will need outpatient follow-up  Atrial flutter ([place]) EKG with atrial flutter with variable AV block.  Given concern for GI bleed, holding home Eliquis at this time.  - Holding home Eliquis - Continue home carvedilol  Type 2 diabetes mellitus (HCC) Last A1c within goal at 5.9%.  - Hold home glimepiride - Continue home long-acting insulin in the a.m. - SSI, sensitive  Essential hypertension - Continue home antihypertensives  COPD (chronic obstructive pulmonary disease) (HCC) No wheezing on examination today  - Continue home bronchodilators - DuoNebs every 6 hours as needed  Advance Care Planning:   Code Status: Full Code verified by patient  Consults: None  Family Communication: No family at bedside  Severity of  Illness: The appropriate patient status for this patient is OBSERVATION. Observation status is judged to be reasonable and necessary in order to provide the required intensity of service to ensure the patient's safety. The patient's presenting symptoms,  physical exam findings, and initial radiographic and laboratory data in the context of their medical condition is felt to place them at decreased risk for further clinical deterioration. Furthermore, it is anticipated that the patient will be medically stable for discharge from the hospital within 2 midnights of admission.   Author: Jose Persia, MD 07/31/2022 6:37 PM  For on call review [email or web address].

## 2022-07-31 NOTE — Assessment & Plan Note (Signed)
CT imaging obtained today demonstrated increased size of heterogenous mass in the inferior pole of the right kidney compared to 2021.  Concerning for underlying renal cell carcinoma.  In addition, there is evidence of a 2.1 cm sclerotic lesion in the T12 vertebrae.  Will need outpatient follow-up

## 2022-07-31 NOTE — Consult Note (Signed)
WOC Nurse Consult Note: Reason for Consult:Right elbow chronic, nonhealing partial full thickness wound Wound type:full thickness Pressure Injury POA: N/A Measurement:To be obtained by Bedside RN with next dressing change and entered onto Nursing Flow Sheet Wound bed:Red, moist  Drainage (amount, consistency, odor) small serous  Periwound:intact Dressing procedure/placement/frequency: I have provided Nursing with guidance for topical care of this lesion using a daily NS cleanse and placement of a xeroform antimicrobial nonadherent gauze. This is to be topped with dry gauze and secured with a few turns of Kerlix roll gauze/paper tape.  [place] nursing team will not follow, but will remain available to this patient, the nursing and medical teams.  Please re-consult if needed.  Thank you for inviting [name] to participate in this patient's Plan of Care.  Maudie Flakes, MSN, RN, CNS, [place], Serita Grammes, [employer], [employer] phone:  [phone]

## 2022-07-31 NOTE — Assessment & Plan Note (Signed)
Patient presenting with large left flank hematoma after rolling off of his bed while sleeping.  CT with subcutaneous fat stranding but no focal fluid collection.  -Monitor CBC as noted above

## 2022-07-31 NOTE — Assessment & Plan Note (Signed)
Patient notes nausea and vomiting that began suddenly this morning and turned bloody after several episodes of normal-appearing emesis.  Differential includes Mallory-Weiss, however given recent use of doxycycline, pill esophagitis is also on the differential.  No additional episodes since arrival in the ED and FOBT is negative.  - Hold doxycycline - Protonix 40 mg IV twice daily - One-time dose of viscous lidocaine - If additional episodes of emesis should occur, will consult GI at that time - CBC twice daily

## 2022-07-31 NOTE — Assessment & Plan Note (Signed)
Patient endorses chest pain that he believes began around the time of vomiting.  Low suspicion for cardiac etiology as troponin is negative and EKG is stable.  Likely related to vomiting episodes earlier today  -Viscous lidocaine ordered

## 2022-07-31 NOTE — Assessment & Plan Note (Signed)
Patient first presented to the ED on 07/21/2022 with an area of redness, swelling and drainage on the right elbow.  He was started on Keflex.  He was then seen by his PCP on 07/26/2022 and was referred to the wound care center.  He went to the ED on the same day and was started on doxycycline.  Given concern for possible pill esophagitis, will hold doxycycline at this time.  At this time, the wound does not appear to be infected, however he may be having sequelae of slow wound healing.  - Wound care consult placed

## 2022-07-31 NOTE — ED Triage Notes (Signed)
Pt arrived via ems from home due to wound on right elbow. Pt states taking anti-biotics for infection. Pt also co of vomiting this am.

## 2022-07-31 NOTE — Assessment & Plan Note (Signed)
Per chart review, patient's hemoglobin generally ranges between 9-11.  Currently 8.3.  Likely multifactorial in the setting of possible upper GI bleed and large left flank hematoma.  - Repeat CBC this evening - Transfuse for hemoglobin less than 7

## 2022-07-31 NOTE — Assessment & Plan Note (Signed)
EKG with atrial flutter with variable AV block.  Given concern for GI bleed, holding home Eliquis at this time.  - Holding home Eliquis - Continue home carvedilol

## 2022-07-31 NOTE — Assessment & Plan Note (Addendum)
Patient endorsing shortness of breath with pitting edema on exam with BNP elevated at 230 compared to 84 2 weeks ago.  Evidence of congestion on chest x-ray.  Last echocardiogram obtained in 2023 with a EF of 40-45%  - Telemetry monitoring - Lasix 80 mg IV daily - Daily weights - Strict in and out - Continue home GDMT

## 2022-07-31 NOTE — ED Provider Notes (Signed)
[facility] Provider Note    Event Date/Time   First MD Initiated Contact with Patient 07/31/22 1130     (approximate)   History   Wound Check   HPI  Larry Callahan is a 67 y.o. male who comes in for a wound on the right elbow.  Patient currently on antibiotics for infection.  Patient had vomiting this morning.  Patient has a history of atrial flutter CHF coronary disease, diabetes.  Patient was seen on 2/8 for right elbow pain and drainage and was started on Keflex he was then seen again on 2/13 and started on doxycycline.  Patient reports the main reason he is coming in is for chest pain, shortness of breath and feeling like he was nauseous.  He does report a few episodes of vomiting this morning and vomiting up what looked like blood or dark color.  He denies any black tarry stools.  Patient is on a blood thinner, Eliquis.  He initially denies any falls but then later states that he had a dream that he fell out of bed so now he states maybe that actually did happen.   Physical Exam   Triage Vital Signs: ED Triage Vitals [07/31/22 1108]  Enc Vitals Group     BP 132/84     Pulse Rate 74     Resp (!) 24     Temp (!) 97.5 F (36.4 C)     Temp Source Oral     SpO2 100 %     Weight      Height      Head Circumference      Peak Flow      Pain Score      Pain Loc      Pain Edu?      Excl. in [place]?     Most recent vital signs: Vitals:   07/31/22 1108  BP: 132/84  Pulse: 74  Resp: (!) 24  Temp: (!) 97.5 F (36.4 C)  SpO2: 100%     General: Awake, no distress.  CV:  Good peripheral perfusion.  Resp:  Normal effort.  Abd:  No distention.  Other:  Rectal exam is brown stool Hemoccult negative.  Abdomen is soft and nontender.  He does have significant bruising on his left flank area His right elbow has a shallow abrasion noted to without any surrounding erythema.  It looks like it is a well-healing wound.  Full range of motion of the skull  elbow  ED Results / Procedures / Treatments   Labs (all labs ordered are listed, but only abnormal results are displayed) Labs Reviewed  CBC WITH DIFFERENTIAL/PLATELET - Abnormal; Notable for the following components:      Result Value   WBC 3.4 (*)    RBC 3.04 (*)    Hemoglobin 8.3 (*)    HCT 26.4 (*)    RDW 15.9 (*)    All other components within normal limits  COMPREHENSIVE METABOLIC PANEL     EKG  My interpretation of EKG: Patient has atrial flutter with a rate of 88 without any ST elevation or T wave inversions, QTc 534 overall   RADIOLOGY I have reviewed the CT head personally interpreted no evidence of intracranial hemorrhage   PROCEDURES:  Critical Care performed: No  Procedures   MEDICATIONS ORDERED IN ED: Medications  ondansetron (ZOFRAN) injection 4 mg (4 mg Intravenous Given 07/31/22 1234)  pantoprazole (PROTONIX) injection 40 mg (40 mg Intravenous Given 07/31/22 1234)  IMPRESSION / MDM / ASSESSMENT AND PLAN / ED COURSE  I reviewed the triage vital signs and the nursing notes.   Patient's presentation is most consistent with acute presentation with potential threat to life or bodily function.   Differential includes GI bleed, anemia, ACS.  Patient denies known atrial flutter.  Rectal exam with brown stool Hemoccult negative but given the episodes of vomiting I am concerned about his dropping hemoglobin.  CTs were ordered to make sure no evidence of intracranial hemorrhage, r retroperitoneal hemorrhage   White count is slightly low at 3.4.  Hemoglobin has been downtrending.  CMP shows stable creatinine.  No signs of ACS.  Given patient is reporting blood in his vomit with drop in hemoglobin on Eliquis will discuss with hospital team for admission for concern for GI bleed.  Patient started on PPI  The patient is on the cardiac monitor to evaluate for evidence of arrhythmia and/or significant heart rate changes.      FINAL CLINICAL IMPRESSION(S)  / ED DIAGNOSES   Final diagnoses:  Upper GI bleed     Rx / DC Orders   ED Discharge Orders     None        Note:  This document was prepared using Dragon voice recognition software and may include unintentional dictation errors.   Vanessa Harlem, MD 07/31/22 [phone]

## 2022-07-31 NOTE — Assessment & Plan Note (Signed)
Continue home antihypertensives 

## 2022-07-31 NOTE — Assessment & Plan Note (Signed)
Last A1c within goal at 5.9%.  - Hold home glimepiride - Continue home long-acting insulin in the a.m. - SSI, sensitive

## 2022-07-31 NOTE — ED Notes (Signed)
Advised nurse that patient has ready bed 

## 2022-08-01 ENCOUNTER — Observation Stay: Payer: Medicare PPO

## 2022-08-01 ENCOUNTER — Other Ambulatory Visit: Payer: Self-pay | Admitting: Oncology

## 2022-08-01 ENCOUNTER — Encounter: Payer: Self-pay | Admitting: Internal Medicine

## 2022-08-01 DIAGNOSIS — M25552 Pain in left hip: Secondary | ICD-10-CM | POA: Diagnosis not present

## 2022-08-01 DIAGNOSIS — J90 Pleural effusion, not elsewhere classified: Secondary | ICD-10-CM | POA: Diagnosis not present

## 2022-08-01 DIAGNOSIS — J439 Emphysema, unspecified: Secondary | ICD-10-CM | POA: Diagnosis not present

## 2022-08-01 DIAGNOSIS — C801 Malignant (primary) neoplasm, unspecified: Secondary | ICD-10-CM | POA: Diagnosis not present

## 2022-08-01 DIAGNOSIS — K922 Gastrointestinal hemorrhage, unspecified: Secondary | ICD-10-CM | POA: Diagnosis not present

## 2022-08-01 LAB — CBC
HCT: 26.2 % — ABNORMAL LOW (ref 39.0–52.0)
Hemoglobin: 8.1 g/dL — ABNORMAL LOW (ref 13.0–17.0)
MCH: 26.6 pg (ref 26.0–34.0)
MCHC: 30.9 g/dL (ref 30.0–36.0)
MCV: 85.9 fL (ref 80.0–100.0)
Platelets: 189 10*3/uL (ref 150–400)
RBC: 3.05 MIL/uL — ABNORMAL LOW (ref 4.22–5.81)
RDW: 16 % — ABNORMAL HIGH (ref 11.5–15.5)
WBC: 3.3 10*3/uL — ABNORMAL LOW (ref 4.0–10.5)
nRBC: 0 % (ref 0.0–0.2)

## 2022-08-01 LAB — BASIC METABOLIC PANEL
Anion gap: 9 (ref 5–15)
BUN: 49 mg/dL — ABNORMAL HIGH (ref 8–23)
CO2: 23 mmol/L (ref 22–32)
Calcium: 8.4 mg/dL — ABNORMAL LOW (ref 8.9–10.3)
Chloride: 104 mmol/L (ref 98–111)
Creatinine, Ser: 2.46 mg/dL — ABNORMAL HIGH (ref 0.61–1.24)
GFR, Estimated: 28 mL/min — ABNORMAL LOW (ref >60)
Glucose, Bld: 106 mg/dL — ABNORMAL HIGH (ref 70–99)
Potassium: 4 mmol/L (ref 3.5–5.1)
Sodium: 136 mmol/L (ref 135–145)

## 2022-08-01 LAB — VITAMIN B12: Vitamin B-12: 1192 pg/mL — ABNORMAL HIGH (ref 180–914)

## 2022-08-01 LAB — GLUCOSE, CAPILLARY
Glucose-Capillary: 95 mg/dL (ref 70–99)
Glucose-Capillary: 98 mg/dL (ref 70–99)
Glucose-Capillary: 181 mg/dL — ABNORMAL HIGH (ref 70–99)
Glucose-Capillary: 106 mg/dL — ABNORMAL HIGH (ref 70–99)

## 2022-08-01 LAB — IRON AND TIBC
Iron: 35 ug/dL — ABNORMAL LOW (ref 45–182)
Saturation Ratios: 16 % — ABNORMAL LOW (ref 17.9–39.5)
TIBC: 217 ug/dL — ABNORMAL LOW (ref 250–450)
UIBC: 182 ug/dL

## 2022-08-01 LAB — FOLATE: Folate: 7.9 ng/mL (ref >5.9)

## 2022-08-01 MED ORDER — TECHNETIUM TC 99M MEDRONATE IV KIT
20.0000 | PACK | Freq: Once | INTRAVENOUS | Status: AC | PRN
  Administered 2022-08-01: 21.42 via INTRAVENOUS

## 2022-08-01 MED ORDER — SODIUM CHLORIDE 0.9 % IV SOLN
200.0000 mg | INTRAVENOUS | Status: DC
  Administered 2022-08-01 – 2022-08-02 (×2): 200 mg via INTRAVENOUS
  Filled 2022-08-01: qty 200
  Filled 2022-08-01: qty 10
  Filled 2022-08-01: qty 200

## 2022-08-01 MED ORDER — VITAMIN C 500 MG PO TABS
500.0000 mg | ORAL_TABLET | Freq: Every day | ORAL | Status: DC
  Administered 2022-08-01 – 2022-08-03 (×3): 500 mg via ORAL
  Filled 2022-08-01 (×3): qty 1

## 2022-08-01 MED ORDER — FOLIC ACID 1 MG PO TABS
1.0000 mg | ORAL_TABLET | Freq: Every day | ORAL | Status: DC
  Administered 2022-08-01 – 2022-08-03 (×3): 1 mg via ORAL
  Filled 2022-08-01 (×3): qty 1

## 2022-08-01 MED ORDER — PANTOPRAZOLE SODIUM 40 MG IV SOLR
40.0000 mg | Freq: Two times a day (BID) | INTRAVENOUS | Status: DC
  Administered 2022-08-01 – 2022-08-02 (×3): 40 mg via INTRAVENOUS
  Filled 2022-08-01 (×3): qty 10

## 2022-08-01 MED ORDER — INSULIN GLARGINE-YFGN 100 UNIT/ML ~~LOC~~ SOLN
10.0000 [IU] | Freq: Every day | SUBCUTANEOUS | Status: DC
  Administered 2022-08-03: 10 [IU] via SUBCUTANEOUS
  Filled 2022-08-01 (×3): qty 0.1

## 2022-08-01 NOTE — Consult Note (Signed)
[name], MD [facility]  [address], [place], [place] [address] Phone: [phone] Fax : [phone]  Consultation  Referring Provider:     Dr. Loreta Ave Primary Care Physician:  [name], NP Primary Gastroenterologist:  Dr. Marius Ditch         Reason for Consultation:     Hematemesis  Date of Admission:  07/31/2022 Date of Consultation:  08/01/2022         HPI:   Larry Callahan is a 67 y.o. male who was reported to have hematemesis.  The patient had seen me back in July for the very same reason and reports at this time that he was spitting up blood which was the same exact report at the last visit.  The patient had a similar episode in 2021 and underwent an upper endoscopy by Dr. Marius Ditch at that time which was negative.  August the patient had another upper endoscopy for spitting up blood that was also negative for any sign of GI bleeding.  Patient is a poor historian and does not give much information.  The patient's hemoglobin on admission was 8.3 and it is down to 8.1 today.  2 months ago his hemoglobin was 11.2 and 2 weeks ago was 10.0.  Any black stools or bloody stools.  He also denies any abdominal pain.  I being asked to see the patient for possible upper endoscopy due to his report of spitting up blood.  Can also takes Eliquis and it was stopped when he came to the hospital.  Past Medical History:  Diagnosis Date   Allergy    Atrial flutter ([place])    a. Dx 12/2020--CHA2DS2VASc = 5-->eliquis.   Chronic combined systolic (congestive) and diastolic (congestive) heart failure ([place])    a. 04/2019 Echo: EF 45-50%; b. 02/2019 Echo: EF 55-60%; c. 09/2020 Echo: EF 35-40%, glob HK. Nl RV size/fxn. Mild MR; d. 07/2021 Echo: EF 40-45%, mild LVH, low-nl RV fxn, no MR.   CKD (chronic kidney disease), stage III (HCC)    COPD (chronic obstructive pulmonary disease) ([place])    Coronary artery disease    a. 2011 Cath: nonobs dzs; b. 09/2020 MV: EF 38%. No signif ischemia. ? inf  MI vs diaph attenuation. GI uptake artifact.   Diabetes mellitus without complication (HCC)    GERD (gastroesophageal reflux disease)    History of CVA (cerebrovascular accident) 09/25/2020   Hyperlipidemia    Hypertension    Morbid (severe) obesity due to excess calories ([place]) 06/09/2017   Morbid obesity ([place])    NICM (nonischemic cardiomyopathy) ([place])    a. 04/2019 Echo: EF 45-50%; b. 02/2019 Echo: EF 55-60%; c. 09/2020 Echo: EF 35-40%, glob HK; d. 09/2020 MV: No ischemia. ? inf infarct vs attenuation; d. 07/2021 Echo: EF 40-45%.   OSA (obstructive sleep apnea)    Pneumonia due to COVID-19 virus 01/30/2020   Stage 3b chronic kidney disease ([place])    Syncope and collapse 11/16/2020    Past Surgical History:  Procedure Laterality Date   CARDIAC CATHETERIZATION     CHOLECYSTECTOMY     COLONOSCOPY WITH PROPOFOL N/A 04/12/2019   Procedure: COLONOSCOPY WITH PROPOFOL;  Surgeon: [name], MD;  Location: [facility] ENDOSCOPY;  Service: Gastroenterology;  Laterality: N/A;   ESOPHAGOGASTRODUODENOSCOPY N/A 04/12/2019   Procedure: ESOPHAGOGASTRODUODENOSCOPY (EGD);  Surgeon: [name], MD;  Location: [facility] ENDOSCOPY;  Service: Gastroenterology;  Laterality: N/A;   ESOPHAGOGASTRODUODENOSCOPY (EGD) WITH PROPOFOL N/A 11/21/2019   Procedure: ESOPHAGOGASTRODUODENOSCOPY (EGD) WITH  PROPOFOL;  Surgeon: [name], MD;  Location[facility] ENDOSCOPY;  Service: Gastroenterology;  Laterality: N/A;   ESOPHAGOGASTRODUODENOSCOPY (EGD) WITH PROPOFOL N/A 01/11/2022   Procedure: ESOPHAGOGASTRODUODENOSCOPY (EGD) WITH PROPOFOL;  Surgeon: [name], MD;  Location: [name] ENDOSCOPY;  Service: Endoscopy;  Laterality: N/A;   FLEXIBLE BRONCHOSCOPY Bilateral 05/17/2019   Procedure: FLEXIBLE BRONCHOSCOPY;  Surgeon: [name], MD;  Location: [name] ORS;  Service: Pulmonary;  Laterality: Bilateral;   left arm surgery     nephrectomy Left    PARATHYROIDECTOMY     RIGHT HEART CATH N/A 03/29/2019   Procedure:  RIGHT HEART CATH;  Surgeon: [name], MD;  Location: [place] CV LAB;  Service: Cardiovascular;  Laterality: N/A;    Prior to Admission medications   Medication Sig Start Date End Date Taking? Authorizing Provider  apixaban (ELIQUIS) 5 MG TABS tablet Take 1 tablet (5 mg total) by mouth 2 (two) times daily. Must make appt with Dr. Rockey Situ for refills 02/09/22  Yes Gollan, Kathlene November, MD  carvedilol (COREG) 12.5 MG tablet Take 1 tablet (12.5 mg total) by mouth 2 (two) times daily. 02/25/22  Yes [place], Otila Kluver A, FNP  doxycycline (MONODOX) 100 MG capsule Take 100 mg by mouth 2 (two) times daily. 07/27/22  Yes [provider]  furosemide (LASIX) 40 MG tablet Take 1 tablet (40 mg total) by mouth daily. Take lasix 57m po daily 02/25/22  Yes [place], Tina A, FNP  glimepiride (AMARYL) 2 MG tablet Take 1 tablet (2 mg total) by mouth daily. 03/29/22  Yes Abernathy, AYetta Flock NP  hydrALAZINE (APRESOLINE) 100 MG tablet Take 1 tablet (100 mg total) by mouth 3 (three) times daily. 06/14/22  Yes KLavera Guise MD  ipratropium-albuterol (DUONEB) 0.5-2.5 (3) MG/3ML SOLN Take 3 mLs by nebulization every 6 (six) hours as needed. 06/29/21  Yes Danford, CSuann Larry MD  methocarbamol (ROBAXIN) 500 MG tablet Take 1 tablet (500 mg total) by mouth every 6 (six) hours as needed for muscle spasms. 06/16/22  Yes Abernathy, AYetta Flock NP  omeprazole (PRILOSEC) 40 MG capsule Take 1 capsule once in the morning and once in the evening 06/14/22  Yes KLavera Guise MD  potassium chloride SA (KLOR-CON M) 20 MEQ tablet Take 20 mEq by mouth 2 (two) times daily. 06/23/22  Yes [provider]  rosuvastatin (CRESTOR) 10 MG tablet Take 1 tablet (10 mg total) by mouth at bedtime. 03/29/22  Yes Abernathy, AYetta Flock NP  Accu-Chek Softclix Lancets lancets Use to check blood sugars 4 times a day   E11.65 08/20/21   AJonetta Osgood NP  Blood Glucose Monitoring Suppl (TRUE METRIX AIR GLUCOSE METER) DEVI 1 Device by Does not apply  route 2 (two) times daily. 04/13/18   BRonnell Freshwater NP  Budeson-Glycopyrrol-Formoterol (BREZTRI AEROSPHERE) 160-9-4.8 MCG/ACT AERO Inhale 2 puffs into the lungs 2 (two) times daily. 03/29/22   AJonetta Osgood NP  cephALEXin (KEFLEX) 500 MG capsule Take 1 capsule (500 mg total) by mouth 3 (three) times daily. 07/21/22   SJohnn Hai PA-C  cyanocobalamin 1000 MCG tablet Take 1 tablet (1,000 mcg total) by mouth daily. 07/15/22   AJonetta Osgood NP  doxycycline (ADOXA) 100 MG tablet Take 1 tablet (100 mg total) by mouth 2 (two) times daily for 7 days. 07/26/22 08/02/22  MRada Hay MD  glucose blood (ACCU-CHEK GUIDE) test strip Use to check blood sugars 4 times a day   E11.65 12/13/21   KLavera Guise MD  insulin glargine, 2 Unit Dial, (  TOUJEO MAX SOLOSTAR) 300 UNIT/ML Solostar Pen Inject 20 units in am 03/03/22   McDonough, Si Gaul, PA-C  Insulin Pen Needle (NOVOFINE PEN NEEDLE) 32G X 6 MM MISC Use as directed with toujeo pen 03/29/22   [name], NP  mupirocin ointment (BACTROBAN) 2 % Apply 1 Application topically daily. To wound on elbow until healed. 07/26/22   [name], NP  TOUJEO SOLOSTAR 300 UNIT/ML Solostar Pen Inject 26 Units into the skin daily. 06/09/22   Lavera Guise, MD    Family History  Problem Relation Age of Onset   Diabetes Mother        2003   Lung cancer Father    Diabetes Sister    Hypertension Sister    Heart disease Sister    Cancer Sister    Bone cancer Brother    Prostate cancer Neg Hx    Bladder Cancer Neg Hx    Kidney cancer Neg Hx      Social History   Tobacco Use   Smoking status: Former    Types: Cigarettes    Quit date: 03/13/2020    Years since quitting: 2.3   Smokeless tobacco: Former    Types: Nurse, children's Use: Never used  Substance Use Topics   Alcohol use: No   Drug use: No    Allergies as of 07/31/2022 - Review Complete 07/31/2022  Allergen Reaction Noted   Penicillins Rash 07/20/2017     Review of Systems:    All systems reviewed and negative except where noted in HPI.   Physical Exam:  Vital signs in last 24 hours: Temp:  [97.6 F (36.4 C)-98.4 F (36.9 C)] 97.9 F (36.6 C) (02/19 1527) Pulse Rate:  [66-93] 69 (02/19 1527) Resp:  [16-20] 16 (02/19 1527) BP: (128-163)/(66-88) 128/72 (02/19 1527) SpO2:  [98 %-100 %] 98 % (02/19 1527) FiO2 (%):  [28 %] 28 % (02/18 2106) Weight:  [103.1 kg] 103.1 kg (02/19 0559) Last BM Date : 07/30/22 General:   Pleasant, cooperative in NAD Head:  Normocephalic and atraumatic. Eyes:   No icterus.   Conjunctiva pink. PERRLA. Ears:  Normal auditory acuity. Neck:  Supple; no masses or thyroidomegaly Lungs: Respirations even and unlabored. Lungs clear to auscultation bilaterally.   No wheezes, crackles, or rhonchi.  Heart:  Regular rate and rhythm;  Without murmur, clicks, rubs or gallops Abdomen:  Soft, nondistended, nontender. Normal bowel sounds. No appreciable masses or hepatomegaly.  No rebound or guarding.  Rectal:  Not performed. Msk:  Symmetrical without gross deformities.   Extremities:  Without edema, cyanosis or clubbing. Neurologic:  Alert and oriented x3;  grossly normal neurologically. Skin:  Intact without significant lesions or rashes. Cervical Nodes:  No significant cervical adenopathy. Psych:  Alert and cooperative. Normal affect.  LAB RESULTS: Recent Labs    07/31/22 1117 07/31/22 1904 08/01/22 0426  WBC 3.4* 3.9* 3.3*  HGB 8.3* 8.6* 8.1*  HCT 26.4* 27.6* 26.2*  PLT 183 196 189   BMET Recent Labs    07/31/22 1117 08/01/22 0426  NA 139 136  K 4.4 4.0  CL 113* 104  CO2 21* 23  GLUCOSE 130* 106*  BUN 52* 49*  CREATININE 2.51* 2.46*  CALCIUM 8.4* 8.4*   LFT Recent Labs    07/31/22 1117  PROT 6.7  ALBUMIN 2.6*  AST 29  ALT 18  ALKPHOS 61  BILITOT 1.0   PT/INR Recent Labs    07/31/22 1117  LABPROT 19.8*  INR 1.7*    STUDIES: NM Bone Scan Whole Body  Result Date:  08/01/2022 CLINICAL DATA:  Further evaluation of T12 vertebral body lesion. Patient reports fall from bed with left-sided hip pain. EXAM: NUCLEAR MEDICINE WHOLE BODY BONE SCAN TECHNIQUE: Whole body anterior and posterior images were obtained approximately 3 hours after intravenous injection of radiopharmaceutical. RADIOPHARMACEUTICALS:  21.42 mCi Technetium-52mMDP IV COMPARISON:  CT chest dated 08/01/2022 FINDINGS: No focal increased uptake at the level of T12 to correspond to finding on prior chest CT. Multilevel uptake involving the posterior costovertebral junctions, likely degenerative. Focal uptake involving the left greater trochanter may correspond to reported posttraumatic injury. Multifocal areas of periarticular uptake, for example involving the bilateral shoulders, knees, ankles, and left great toe are in a pattern typical for arthropathy. IMPRESSION: 1. No focal radiotracer uptake at the level of T12 to correspond to finding on prior chest CT. 2. Focal uptake involving the left greater trochanter, likely posttraumatic. Electronically Signed   By: LDarrin NipperM.D.   On: 08/01/2022 15:27   CT CHEST WO CONTRAST  Result Date: 08/01/2022 CLINICAL DATA:  Occult malignancy. EXAM: CT CHEST WITHOUT CONTRAST TECHNIQUE: Multidetector CT imaging of the chest was performed following the standard protocol without IV contrast. RADIATION DOSE REDUCTION: This exam was performed according to the departmental dose-optimization program which includes automated exposure control, adjustment of the mA and/or kV according to patient size and/or use of iterative reconstruction technique. COMPARISON:  Chest radiography yesterday. Previous chest CT 05/16/2022. FINDINGS: Cardiovascular: Mild cardiac enlargement. Coronary artery calcification and aortic atherosclerotic calcification are present. Mediastinum/Nodes: No mediastinal or hilar mass or adenopathy visible on this study done without contrast. Lungs/Pleura: There are small  bilateral pleural effusions layering dependently. There is patchy density in the posteroinferior lower lobes that could be atelectasis subsequent to the fusions or could indicate mild basilar pneumonia. Atelectasis is favored. Findings are slightly more pronounced on the left than the right. No evidence bullous emphysema. No mass or nodule to indicate primary or metastatic disease. Upper Abdomen: See results abdominal CT yesterday. Musculoskeletal: Chronic thoracic fusion in the mid and lower thoracic region. Indistinct sclerotic change within the T12 vertebral body, progressive since studies as distant as 2020. No sign of lytic destructive change or extraosseous soft tissue tumor. The overall findings look indolent/benign, but progressive change over the last 4 years does raise some concern. If the patient has a malignancy that could result in sclerotic bone metastases, that possibility should be considered. IMPRESSION: 1. Small bilateral pleural effusions layering dependently. Patchy density in the posteroinferior lower lobes that could be atelectasis subsequent to the effusions or could indicate mild basilar pneumonia. Findings are slightly more pronounced on the left than the right. 2. Mild cardiac enlargement. Coronary artery calcification and aortic atherosclerotic calcification. 3. Indistinct sclerotic change within the T12 vertebral body, progressive since studies as distant as 2020. No sign of lytic destructive change or extraosseous soft tissue tumor. The overall findings look indolent/benign, but progressive change over the last 4 years does raise some concern. If the patient has a malignancy that could result in sclerotic bone metastases, that possibility should be considered. 4. Aortic atherosclerosis. 5. Emphysema. Aortic Atherosclerosis (ICD10-I70.0) and Emphysema (ICD10-J43.9). Electronically Signed   By: MNelson ChimesM.D.   On: 08/01/2022 13:24   CT ABDOMEN PELVIS WO CONTRAST  Result Date:  07/31/2022 CLINICAL DATA:  Left flank bruising. Evaluate for bleed. Patient also complains of vomiting this morning. EXAM: CT ABDOMEN AND PELVIS WITHOUT CONTRAST  TECHNIQUE: Multidetector CT imaging of the abdomen and pelvis was performed following the standard protocol without IV contrast. RADIATION DOSE REDUCTION: This exam was performed according to the departmental dose-optimization program which includes automated exposure control, adjustment of the mA and/or kV according to patient size and/or use of iterative reconstruction technique. COMPARISON:  Diffuse severe parenchymal volume loss and mild fatty infiltration. The main pancreatic duct is not dilated. No peripancreatic fat stranding or fluid collection. FINDINGS: Lower chest: Trace pleural fluid bilaterally with adjacent dependent atelectasis. Calcified granuloma in the right middle lobe. Hypodense blood pool, suggestive of anemia. Hepatobiliary: No focal liver abnormality is seen. Status post cholecystectomy. No biliary dilatation. Pancreas: Diffuse severe parenchymal atrophy and mild fatty infiltration. The main pancreatic duct is not dilated. No peripancreatic fat stranding or fluid collection. Spleen: Normal in size without focal abnormality. Multiple calcified granulomas scattered throughout the spleen. Adrenals/Urinary Tract: Left adrenal mass measures 8.6 x 5.6 cm, previously 8.3 x 6.0 cm on 02/22/2022 (series 2, image 25). Coarse calcification again noted along the edge of the mass. Right adrenal gland is unremarkable. Postoperative changes of left nephrectomy. Heterogeneous mass at the inferior pole of the right kidney, best visualized on sagittal and coronal series measures approximately 6.2 x 4.9 cm, not significantly changed compared to 02/22/2022 when measured in a similar fashion but increased in size compared to 12/10/2019 when it measured approximately 5.2 x 3.7 cm (series 6, image 58). No right hydronephrosis or nephrolithiasis. Several  fluid density benign cortical cysts again noted in the right kidney. Mildly distended urinary bladder is unremarkable. Stomach/Bowel: Stomach is within normal limits. Appendix appears normal. Diverticulosis of the descending and sigmoid colon. No evidence of bowel wall thickening, distention, or inflammatory changes. Vascular/Lymphatic: Aortic atherosclerosis. No enlarged abdominal or pelvic lymph nodes. Reproductive: Prostate is unremarkable. Other: Fat containing 7.8 cm right umbilical hernia (series 2, image 60). A 5.7 cm left lumbar hernia contains a small knuckle of small bowel and fat (series 2, image 36). Mild subcutaneous fat stranding is noted in the region of the left flank. No focal fluid collection. Musculoskeletal: A 2.1 x 2.0 cm sclerotic lesion with ill-defined margins is noted in the posterior aspect of the T12 vertebral body (series 2, image 25). No acute osseous findings. Flowing anterior osteophytes in the visualized distal thoracic spine, consistent with diffuse idiopathic skeletal hyperostosis. Vacuum disc phenomenon at L2-L3 and L4-L5. IMPRESSION: 1. Mild subcutaneous fat stranding in the left flank without focal fluid collection. No CT findings on this unenhanced exam to suggest active bleed as queried. 2. A 6.2 cm heterogeneous mass at the inferior pole of the right kidney is not significantly changed compared to 02/22/2022 but is increased in size compared to 12/10/2019. This remains concerning for renal cell carcinoma. When the patient is clinically stable and able to follow directions and hold their breath (preferably as an outpatient) further evaluation with dedicated abdominal MRI should be considered. 3. Indeterminate 2.1 cm sclerotic lesion with ill-defined margins is noted in the T12 vertebral body. This is concerning for possible osseous metastatic disease. Recommend nuclear medicine bone scan for further evaluation. 4. Trace pleural fluid bilaterally with adjacent dependent  atelectasis. 5. Hypodense blood pool, suggestive of anemia. 6. Other chronic findings as described in the body of the report. 7. Aortic atherosclerosis. Aortic Atherosclerosis (ICD10-I70.0). Electronically Signed   By: Ileana Roup M.D.   On: 07/31/2022 15:21   CT Cervical Spine Wo Contrast  Result Date: 07/31/2022 CLINICAL DATA:  Neck trauma. EXAM: CT  CERVICAL SPINE WITHOUT CONTRAST TECHNIQUE: Multidetector CT imaging of the cervical spine was performed without intravenous contrast. Multiplanar CT image reconstructions were also generated. RADIATION DOSE REDUCTION: This exam was performed according to the departmental dose-optimization program which includes automated exposure control, adjustment of the mA and/or kV according to patient size and/or use of iterative reconstruction technique. COMPARISON:  05/04/2022 FINDINGS: Alignment: Normal. Skull base and vertebrae: No acute fracture. No primary bone lesion or focal pathologic process. Soft tissues and spinal canal: No prevertebral fluid or swelling. No visible canal hematoma. Disc levels: Disc space narrowing with marginal osteophyte formation C2-3 through C5-6. Upper chest: Negative. IMPRESSION: Degenerative changes.  No acute traumatic abnormalities. Electronically Signed   By: Sammie Bench M.D.   On: 07/31/2022 14:51   CT HEAD WO CONTRAST (5MM)  Result Date: 07/31/2022 CLINICAL DATA:  Head trauma. EXAM: CT HEAD WITHOUT CONTRAST TECHNIQUE: Contiguous axial images were obtained from the base of the skull through the vertex without intravenous contrast. RADIATION DOSE REDUCTION: This exam was performed according to the departmental dose-optimization program which includes automated exposure control, adjustment of the mA and/or kV according to patient size and/or use of iterative reconstruction technique. COMPARISON:  05/16/2022 FINDINGS: Brain: There is periventricular white matter decreased attenuation consistent with small vessel ischemic changes.  Gray-white differentiation is preserved. No acute intracranial hemorrhage, mass effect or shift. No hydrocephalus. Vascular: No hyperdense vessel or unexpected calcification. Skull: Normal. Negative for fracture or focal lesion. Sinuses/Orbits: Mucoperiosteal thickening consistent with chronic maxillary and ethmoid sinusitis. IMPRESSION: Periventricular white matter changes consistent with chronic small vessel ischemia. No acute intracranial process identified. Electronically Signed   By: Sammie Bench M.D.   On: 07/31/2022 14:48   DG Chest 2 View  Result Date: 07/31/2022 CLINICAL DATA:  Shortness of breath.  Wound to right elbow. EXAM: CHEST - 2 VIEW COMPARISON:  07/21/2022 FINDINGS: Cardiac enlargement. Aortic atherosclerosis. Pulmonary vascular congestion. Blunting of the costophrenic angle suggests small pleural effusions. No airspace opacities identified. Degenerative disc disease noted within the thoracic spine. IMPRESSION: Cardiac enlargement and pulmonary vascular congestion. Small bilateral pleural effusions suspected. Electronically Signed   By: Kerby Moors M.D.   On: 07/31/2022 13:07      Impression / Plan:   Assessment: Principal Problem:   Acute upper GI bleed Active Problems:   COPD (chronic obstructive pulmonary disease) (HCC)   Essential hypertension   Type 2 diabetes mellitus (HCC)   Atrial flutter (HCC)   Cellulitis of right arm   Acute on chronic systolic CHF (congestive heart failure) (HCC)   Chest pain   Hematoma of left flank   Acute on chronic anemia   Renal mass   Larry Callahan is a 67 y.o. y/o male with medical admissions to the hospital with a report of spitting up blood.  The patient has had 2 EGDs for this reason and both were normal.  The patient's hemoglobin now seems to have dropped from 2 weeks ago.  The patient's colonoscopy from 2020 showed a poor prep and a single polyp.  Plan:  Patient will be set up for a repeat upper endoscopy for  tomorrow.  The patient will be put on a clear liquid diet and be n.p.o. after midnight.  The patient has been explained the plan and agrees with it.  Thank you for involving me in the care of this patient.      LOS: 0 days   [name], MD, [facility] 08/01/2022, 4:25 PM,  Pager [phone] 7am-5pm  Check AMION for 5pm -7am coverage and on weekends   Note: This dictation was prepared with Dragon dictation along with smaller phrase technology. Any transcriptional errors that result from this process are unintentional.

## 2022-08-01 NOTE — Progress Notes (Signed)
[facility] Progress Note  Patient: Larry Callahan    [id number]  DOA: 07/31/2022     Date of Service: the patient was seen and examined on 08/01/2022  Chief Complaint  Patient presents with   Wound Check   Brief hospital course: Larry Callahan is a 67 y.o. male with medical history significant of CAD, COPD, CKD stage III, nonischemic cardiomyopathy (last EF 40-45%, 07/2021), hypertension, OSA, hyperlipidemia, who presents to the ED due to a wound check.  Right elbow wound developed 2 weeks ago, not healing well, in the morning time patient was feeling nausea and vomiting, had hematemesis.  Patient was also feeling short of breath.  Patient fell off the bed 2 days ago, found to have bruised right lower back. ED workup, atrial flutter, rate controlled, BP controlled.  CKD stage IV, creatinine at baseline, BNP 230 slightly elevated, anemia Hb 8.3, COVID and flu negative.  CT head and C-spine negative for any acute findings CT abdomen pelvis shows right renal mass, T12 lesion. CXR: Cardiac enlargement and pulmonary vascular congestion. Small bilateral pleural effusions suspected.   Assessment and Plan:  Acute upper GI bleeding Patient presented with hematemesis and chest pain could be due to vomiting. Continue pantoprazole 40 mg IV twice daily Troponin negative, currently chest pain-free Started clear liquid diet Hb 8.1 stable GI consulted, most likely EGD tomorrow a.m. Keep n.p.o. after midnight Monitor H&H and transfuse if hemoglobin less than 7   Renal mass, right and T12 vertebral sclerotic lesion CT imaging obtained today demonstrated increased size of heterogenous mass in the inferior pole of the right kidney compared to 2021. Concerning for underlying renal cell carcinoma. In addition, there is evidence of a 2.1 cm sclerotic lesion in the T12 vertebrae.  Patient is following with urology, recommended he will need follow-up as an outpatient Patient may  need MRI of right kidney as an outpatient Oncology consulted, recommended bone scan which is pending   Anemia multifactorial, iron deficiency anemia and upper GI bleeding Continue to monitor H&H and transfuse if hemoglobin less than 7 Iron level 35, transferrin saturation 16% Venofer 200 mg IV daily for 5 days, oral iron supplement at discharge Folic acid 7.9, at lower end, started oral supplement.   Atrial flutter, rate controlled Held Eliquis for now due to upper GI bleeding Continue monitor on telemetry Coreg 12.5 mg p.o. twice daily   HTN, HLD, chronic systolic CHF LVEF 40 to [id number] No exacerbation noticed, patient is euvolemic S/p Lasix 80 mg x 1 dose given in the ED Continue Coreg, hydralazine, Crestor Monitor BP and titrate medications accordingly   CKD stage IV Baseline creatinine 2.4--2.6  Cr 2.46 stable Monitor renal functions daily,  avoid nephrotoxic medications, use renal dose medication.   Diabetes mellitus type 2 Held home medications for now continue Semglee 10 units daily, NovoLog sliding scale Monitor CBG, continue diabetic diet  COPD, no exacerbation noticed on exam Continue Dulera inhaler and Incruse Ellipta inhaler Saturating well on room air   Right elbow wound, chronic, patient was on doxycycline as an outpatient which has been stopped. No signs of infection Wound care RN was consulted, continue dressing   Hematoma of left flank atient presenting with large left flank hematoma after rolling off of his bed while sleeping.  CT with subcutaneous fat stranding but no focal fluid collection.  -Monitor CBC as noted above   Body mass index is 31.7 kg/m.  Interventions:  Diet: CLD DVT Prophylaxis: SCD, pharmacological prophylaxis contraindicated due  to GI bleeding    Advance goals of care discussion: Full code  Family Communication: family was not present at bedside, at the time of interview.  The pt provided permission to discuss medical plan  with the family. Opportunity was given to ask question and all questions were answered satisfactorily.   Disposition:  Pt is from Home, admitted with hematemesis, right elbow wound, renal mass and T12 lesion, needs GI workup, still on IV PPI, bone scan is pending, which precludes a safe discharge. Discharge to Home, when clinically stable, may need few days to improve..  Subjective: No significant overnight events.  Patient was laying comfortably in the bed, patient stated that he is having chronic wound that is why he came in and he does not remember anything else.  When I reminded him about hematemesis then he said he he was spitting some blood.  Patient was asking when he can go home, explained to him about incidental findings of T12 lesion and right renal mass.  And patient will need a GI workup and he verbalized understanding.  Patient seems to be having forgetfulness, may be dementia.  May not remember our discussion.  Physical Exam: General: NAD, lying comfortably Appear in no distress, affect appropriate Eyes: PERRLA ENT: Oral Mucosa Clear, moist  Neck: no JVD,  Cardiovascular: Atrial flutter, irregular rhythm, no Murmur,  Respiratory: good respiratory effort, Bilateral Air entry equal and Decreased, no Crackles, no wheezes Abdomen: Bowel Sound present, Soft and no tenderness,  Skin: no rashes Extremities: no Pedal edema, no calf tenderness Neurologic: without any new focal findings Gait not checked due to patient safety concerns  Vitals:   07/31/22 1804 07/31/22 2132 08/01/22 0559 08/01/22 0732  BP: 138/88 (!) 155/73 (!) 155/70 (!) 163/66  Pulse: 81 88 93 66  Resp: 18 20 18 20  $ Temp: 97.9 F (36.6 C) 98.4 F (36.9 C) 97.7 F (36.5 C)   TempSrc:      SpO2: 99% 98% 100% 100%  Weight:   103.1 kg   Height:   5' 11"$  (1.803 m)     Intake/Output Summary (Last 24 hours) at 08/01/2022 1350 Last data filed at 08/01/2022 1000 Gross per 24 hour  Intake --  Output 3200 ml  Net  -3200 ml   Filed Weights   08/01/22 0559  Weight: 103.1 kg    Data Reviewed: I have personally reviewed and interpreted daily labs, tele strips, imagings as discussed above. I reviewed all nursing notes, pharmacy notes, vitals, pertinent old records I have discussed plan of care as described above with RN and patient/family.  CBC: Recent Labs  Lab 07/31/22 1117 07/31/22 1904 08/01/22 0426  WBC 3.4* 3.9* 3.3*  NEUTROABS 2.3  --   --   HGB 8.3* 8.6* 8.1*  HCT 26.4* 27.6* 26.2*  MCV 86.8 86.0 85.9  PLT 183 196 [id number]   Basic Metabolic Panel: Recent Labs  Lab 07/31/22 1117 08/01/22 0426  NA 139 136  K 4.4 4.0  CL 113* 104  CO2 21* 23  GLUCOSE 130* 106*  BUN 52* 49*  CREATININE 2.51* 2.46*  CALCIUM 8.4* 8.4*    Studies: CT CHEST WO CONTRAST  Result Date: 08/01/2022 CLINICAL DATA:  Occult malignancy. EXAM: CT CHEST WITHOUT CONTRAST TECHNIQUE: Multidetector CT imaging of the chest was performed following the standard protocol without IV contrast. RADIATION DOSE REDUCTION: This exam was performed according to the departmental dose-optimization program which includes automated exposure control, adjustment of the mA and/or kV according to patient  size and/or use of iterative reconstruction technique. COMPARISON:  Chest radiography yesterday. Previous chest CT 05/16/2022. FINDINGS: Cardiovascular: Mild cardiac enlargement. Coronary artery calcification and aortic atherosclerotic calcification are present. Mediastinum/Nodes: No mediastinal or hilar mass or adenopathy visible on this study done without contrast. Lungs/Pleura: There are small bilateral pleural effusions layering dependently. There is patchy density in the posteroinferior lower lobes that could be atelectasis subsequent to the fusions or could indicate mild basilar pneumonia. Atelectasis is favored. Findings are slightly more pronounced on the left than the right. No evidence bullous emphysema. No mass or nodule to indicate  primary or metastatic disease. Upper Abdomen: See results abdominal CT yesterday. Musculoskeletal: Chronic thoracic fusion in the mid and lower thoracic region. Indistinct sclerotic change within the T12 vertebral body, progressive since studies as distant as 2020. No sign of lytic destructive change or extraosseous soft tissue tumor. The overall findings look indolent/benign, but progressive change over the last 4 years does raise some concern. If the patient has a malignancy that could result in sclerotic bone metastases, that possibility should be considered. IMPRESSION: 1. Small bilateral pleural effusions layering dependently. Patchy density in the posteroinferior lower lobes that could be atelectasis subsequent to the effusions or could indicate mild basilar pneumonia. Findings are slightly more pronounced on the left than the right. 2. Mild cardiac enlargement. Coronary artery calcification and aortic atherosclerotic calcification. 3. Indistinct sclerotic change within the T12 vertebral body, progressive since studies as distant as 2020. No sign of lytic destructive change or extraosseous soft tissue tumor. The overall findings look indolent/benign, but progressive change over the last 4 years does raise some concern. If the patient has a malignancy that could result in sclerotic bone metastases, that possibility should be considered. 4. Aortic atherosclerosis. 5. Emphysema. Aortic Atherosclerosis (ICD10-I70.0) and Emphysema (ICD10-J43.9). Electronically Signed   By: Nelson Chimes M.D.   On: 08/01/2022 13:24   CT ABDOMEN PELVIS WO CONTRAST  Result Date: 07/31/2022 CLINICAL DATA:  Left flank bruising. Evaluate for bleed. Patient also complains of vomiting this morning. EXAM: CT ABDOMEN AND PELVIS WITHOUT CONTRAST TECHNIQUE: Multidetector CT imaging of the abdomen and pelvis was performed following the standard protocol without IV contrast. RADIATION DOSE REDUCTION: This exam was performed according to the  departmental dose-optimization program which includes automated exposure control, adjustment of the mA and/or kV according to patient size and/or use of iterative reconstruction technique. COMPARISON:  Diffuse severe parenchymal volume loss and mild fatty infiltration. The main pancreatic duct is not dilated. No peripancreatic fat stranding or fluid collection. FINDINGS: Lower chest: Trace pleural fluid bilaterally with adjacent dependent atelectasis. Calcified granuloma in the right middle lobe. Hypodense blood pool, suggestive of anemia. Hepatobiliary: No focal liver abnormality is seen. Status post cholecystectomy. No biliary dilatation. Pancreas: Diffuse severe parenchymal atrophy and mild fatty infiltration. The main pancreatic duct is not dilated. No peripancreatic fat stranding or fluid collection. Spleen: Normal in size without focal abnormality. Multiple calcified granulomas scattered throughout the spleen. Adrenals/Urinary Tract: Left adrenal mass measures 8.6 x 5.6 cm, previously 8.3 x 6.0 cm on 02/22/2022 (series 2, image 25). Coarse calcification again noted along the edge of the mass. Right adrenal gland is unremarkable. Postoperative changes of left nephrectomy. Heterogeneous mass at the inferior pole of the right kidney, best visualized on sagittal and coronal series measures approximately 6.2 x 4.9 cm, not significantly changed compared to 02/22/2022 when measured in a similar fashion but increased in size compared to 12/10/2019 when it measured approximately 5.2 x 3.7 cm (  series 6, image 58). No right hydronephrosis or nephrolithiasis. Several fluid density benign cortical cysts again noted in the right kidney. Mildly distended urinary bladder is unremarkable. Stomach/Bowel: Stomach is within normal limits. Appendix appears normal. Diverticulosis of the descending and sigmoid colon. No evidence of bowel wall thickening, distention, or inflammatory changes. Vascular/Lymphatic: Aortic  atherosclerosis. No enlarged abdominal or pelvic lymph nodes. Reproductive: Prostate is unremarkable. Other: Fat containing 7.8 cm right umbilical hernia (series 2, image 60). A 5.7 cm left lumbar hernia contains a small knuckle of small bowel and fat (series 2, image 36). Mild subcutaneous fat stranding is noted in the region of the left flank. No focal fluid collection. Musculoskeletal: A 2.1 x 2.0 cm sclerotic lesion with ill-defined margins is noted in the posterior aspect of the T12 vertebral body (series 2, image 25). No acute osseous findings. Flowing anterior osteophytes in the visualized distal thoracic spine, consistent with diffuse idiopathic skeletal hyperostosis. Vacuum disc phenomenon at L2-L3 and L4-L5. IMPRESSION: 1. Mild subcutaneous fat stranding in the left flank without focal fluid collection. No CT findings on this unenhanced exam to suggest active bleed as queried. 2. A 6.2 cm heterogeneous mass at the inferior pole of the right kidney is not significantly changed compared to 02/22/2022 but is increased in size compared to 12/10/2019. This remains concerning for renal cell carcinoma. When the patient is clinically stable and able to follow directions and hold their breath (preferably as an outpatient) further evaluation with dedicated abdominal MRI should be considered. 3. Indeterminate 2.1 cm sclerotic lesion with ill-defined margins is noted in the T12 vertebral body. This is concerning for possible osseous metastatic disease. Recommend nuclear medicine bone scan for further evaluation. 4. Trace pleural fluid bilaterally with adjacent dependent atelectasis. 5. Hypodense blood pool, suggestive of anemia. 6. Other chronic findings as described in the body of the report. 7. Aortic atherosclerosis. Aortic Atherosclerosis (ICD10-I70.0). Electronically Signed   By: Ileana Roup M.D.   On: 07/31/2022 15:21   CT Cervical Spine Wo Contrast  Result Date: 07/31/2022 CLINICAL DATA:  Neck trauma.  EXAM: CT CERVICAL SPINE WITHOUT CONTRAST TECHNIQUE: Multidetector CT imaging of the cervical spine was performed without intravenous contrast. Multiplanar CT image reconstructions were also generated. RADIATION DOSE REDUCTION: This exam was performed according to the departmental dose-optimization program which includes automated exposure control, adjustment of the mA and/or kV according to patient size and/or use of iterative reconstruction technique. COMPARISON:  05/04/2022 FINDINGS: Alignment: Normal. Skull base and vertebrae: No acute fracture. No primary bone lesion or focal pathologic process. Soft tissues and spinal canal: No prevertebral fluid or swelling. No visible canal hematoma. Disc levels: Disc space narrowing with marginal osteophyte formation C2-3 through C5-6. Upper chest: Negative. IMPRESSION: Degenerative changes.  No acute traumatic abnormalities. Electronically Signed   By: Sammie Bench M.D.   On: 07/31/2022 14:51   CT HEAD WO CONTRAST (5MM)  Result Date: 07/31/2022 CLINICAL DATA:  Head trauma. EXAM: CT HEAD WITHOUT CONTRAST TECHNIQUE: Contiguous axial images were obtained from the base of the skull through the vertex without intravenous contrast. RADIATION DOSE REDUCTION: This exam was performed according to the departmental dose-optimization program which includes automated exposure control, adjustment of the mA and/or kV according to patient size and/or use of iterative reconstruction technique. COMPARISON:  05/16/2022 FINDINGS: Brain: There is periventricular white matter decreased attenuation consistent with small vessel ischemic changes. Gray-white differentiation is preserved. No acute intracranial hemorrhage, mass effect or shift. No hydrocephalus. Vascular: No hyperdense vessel or unexpected calcification.  Skull: Normal. Negative for fracture or focal lesion. Sinuses/Orbits: Mucoperiosteal thickening consistent with chronic maxillary and ethmoid sinusitis. IMPRESSION:  Periventricular white matter changes consistent with chronic small vessel ischemia. No acute intracranial process identified. Electronically Signed   By: Sammie Bench M.D.   On: 07/31/2022 14:48    Scheduled Meds:  ascorbic acid  500 mg Oral Daily   carvedilol  12.5 mg Oral BID   hydrALAZINE  100 mg Oral TID   insulin aspart  0-9 Units Subcutaneous TID WC   insulin glargine-yfgn  10 Units Subcutaneous Daily   lidocaine  15 mL Mouth/Throat Once   mometasone-formoterol  2 puff Inhalation BID   pantoprazole (PROTONIX) IV  40 mg Intravenous Q12H   rosuvastatin  10 mg Oral QHS   sodium chloride flush  3 mL Intravenous Q12H   umeclidinium bromide  1 puff Inhalation Daily   Continuous Infusions:  iron sucrose     PRN Meds: acetaminophen **OR** acetaminophen, ipratropium-albuterol, ondansetron **OR** ondansetron (ZOFRAN) IV, polyethylene glycol  Time spent: 55 minutes  Author: Val Riles. MD [facility] 08/01/2022 1:50 PM  To reach On-call, see care teams to locate the attending and reach out to them via [email or web address]. If 7PM-7AM, please contact night-coverage If you still have difficulty reaching the attending provider, please page the [facility] (Director on Call) for [facility] on amion for assistance.

## 2022-08-01 NOTE — Consult Note (Signed)
Hematology/Oncology Consult note [facility] Telephone:([phone] [phone]  Patient Care Team: [name], NP as PCP - General (Nurse Practitioner) [name], MD as PCP - Cardiology (Cardiology) [name], MD as Consulting Physician (Pulmonary Disease) Alena Bills, [facility] as Pharmacist (Pharmacist)   Name of the patient: Larry Callahan  JU:[id number]  [birth year]    Reason for consult: Renal mass, bone lesion   Requesting physician: Dr. Dwyane Dee  Date of visit:08/01/2022    History of presenting illness-patient is a 67 year old male with a past medical history significant for stage III CKD, COPD, CAD, nonischemic cardiomyopathy, OSA, hyperlipidemia who presented to the hospital with symptoms of right nonhealing elbow wound.  He also had some symptoms of nausea vomiting and there was concern for upper GI bleed.  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  6.2 x 4.9 cm mass in the inferior pole of the right kidney concerning for renal cancer.  2.1 x 2 cm sclerotic lesion in the T12 vertebral body.  Patient has seen Dr. Erlene Quan in the past and has not been deemed to be a candidate for nephrectomy.  He has declined dialysis should the need arise and there was a concern that any kind of surgical intervention for the right renal mass could potentially lead to him requiring dialysis.  ECOG PS- 2  Pain scale- 0   Review of systems- Review of Systems  Constitutional:  Positive for malaise/fatigue. Negative for chills, fever and weight loss.  HENT:  Negative for congestion, ear discharge and nosebleeds.   Eyes:  Negative for blurred vision.  Respiratory:  Negative for cough, hemoptysis, sputum production, shortness of breath and wheezing.    Cardiovascular:  Negative for chest pain, palpitations, orthopnea and claudication.  Gastrointestinal:  Negative for abdominal pain, blood in stool, constipation, diarrhea, heartburn, melena, nausea and vomiting.  Genitourinary:  Negative for dysuria, flank pain, frequency, hematuria and urgency.  Musculoskeletal:  Negative for back pain, joint pain and myalgias.  Skin:  Negative for rash.  Neurological:  Negative for dizziness, tingling, focal weakness, seizures, weakness and headaches.  Endo/Heme/Allergies:  Does not bruise/bleed easily.  Psychiatric/Behavioral:  Negative for depression and suicidal ideas. The patient does not have insomnia.     Allergies  Allergen Reactions   Penicillins Rash    Patient Active Problem List   Diagnosis Date Noted   Acute upper GI bleed 07/31/2022   Chest pain 07/31/2022   Hematoma of left flank 07/31/2022   Acute on chronic anemia 07/31/2022   Renal mass 07/31/2022   Upper GI bleed    Dizziness and giddiness 99991111   Umbilical hernia [id number]   Acute on chronic systolic CHF (congestive heart failure) ([place]) 08/05/2021   Acquired thrombophilia ([place]) 06/28/2021   Anemia of chronic kidney failure 06/28/2021   PAD (peripheral artery disease) (HCC)    Cellulitis of right arm 03/02/2021   Atrial flutter ([place]) 01/07/2021   Demand ischemia    Influenza A 10/30/2020   HLD (hyperlipidemia) 10/06/2020   Gastroesophageal reflux disease without esophagitis 04/19/2020   Elevated troponin I level 01/29/2020   Hematemesis 01/09/2020   Hospital discharge follow-up 11/26/2019   Obesity (BMI 30-39.9) 08/08/2019   Acute right hip pain 06/17/2019   Inability to ambulate due to hip  06/17/2019   CKD (chronic kidney disease), stage IV ([place])    Hypervolemia    Full code status 05/20/2019   Pleuritic chest pain    Acute kidney injury superimposed on CKD 3b ([place]) 05/09/2019   Acute on chronic heart failure with preserved ejection fraction (HFpEF) (HCC)     Chronic systolic CHF (congestive heart failure) ([place]) 03/01/2019   Iron deficiency anemia 12/31/2018   Atopic dermatitis 11/24/2018   Screening for colon cancer 11/06/2017   Type 2 diabetes mellitus ([place]) 11/06/2017   Hidradenitis 06/09/2017   Atherosclerotic heart disease of native coronary artery without angina pectoris 06/09/2017   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   Obstructive sleep apnea, adult 06/09/2017   Cigarette nicotine dependence with nicotine-induced disorder 06/09/2017   Diabetic polyneuropathy associated with type 2 diabetes mellitus ([place]) 06/09/2017   Allergic rhinitis due to pollen 06/09/2017   Mixed hyperlipidemia 06/09/2017   COPD (chronic obstructive pulmonary disease) ([place]) 06/09/2017   Wheezing 06/09/2017   Dysuria 06/09/2017   Essential hypertension 06/09/2017   Personal history of other malignant neoplasm of kidney 06/09/2017   Pain in right hip 06/09/2017   Secondary hyperparathyroidism, not elsewhere classified ([place]) 06/09/2017   Tinea corporis 06/09/2017     Past Medical History:  Diagnosis Date   Allergy    Atrial flutter ([place])    a. Dx 12/2020--CHA2DS2VASc = 5-->eliquis.   Chronic combined systolic (congestive) and diastolic (congestive) heart failure ([place])    a. 04/2019 Echo: EF 45-50%; b. 02/2019 Echo: EF 55-60%; c. 09/2020 Echo: EF 35-40%, glob HK. Nl RV size/fxn. Mild MR; d. 07/2021 Echo: EF 40-45%, mild LVH, low-nl RV fxn, no MR.   CKD (chronic kidney disease), stage III (HCC)    COPD (chronic obstructive pulmonary disease) ([place])    Coronary artery disease    a. 2011 Cath: nonobs dzs; b. 09/2020 MV: EF 38%. No signif ischemia. ? inf MI vs diaph attenuation. GI uptake artifact.   Diabetes mellitus without complication (HCC)    GERD (gastroesophageal reflux disease)    History of CVA (cerebrovascular accident) 09/25/2020   Hyperlipidemia    Hypertension    Morbid (severe) obesity due to excess calories ([place])  06/09/2017   Morbid obesity ([place])    NICM (nonischemic cardiomyopathy) ([place])    a. 04/2019 Echo: EF 45-50%; b. 02/2019 Echo: EF 55-60%; c. 09/2020 Echo: EF 35-40%, glob HK; d. 09/2020 MV: No ischemia. ? inf infarct vs attenuation; d. 07/2021 Echo: EF 40-45%.   OSA (obstructive sleep apnea)    Pneumonia due to COVID-19 virus 01/30/2020   Stage 3b chronic kidney disease ([place])    Syncope and collapse 11/16/2020     Past Surgical History:  Procedure Laterality Date   CARDIAC CATHETERIZATION     CHOLECYSTECTOMY     COLONOSCOPY WITH PROPOFOL N/A 04/12/2019   Procedure: COLONOSCOPY WITH PROPOFOL;  Surgeon: [name], MD;  Location: [facility] ENDOSCOPY;  Service: Gastroenterology;  Laterality: N/A;   ESOPHAGOGASTRODUODENOSCOPY N/A 04/12/2019   Procedure: ESOPHAGOGASTRODUODENOSCOPY (EGD);  Surgeon: [name], MD;  Location: [facility] ENDOSCOPY;  Service: Gastroenterology;  Laterality: N/A;   ESOPHAGOGASTRODUODENOSCOPY (EGD) WITH PROPOFOL N/A 11/21/2019   Procedure: ESOPHAGOGASTRODUODENOSCOPY (EGD) WITH PROPOFOL;  Surgeon: [name], MD;  Location[facility] ENDOSCOPY;  Service: Gastroenterology;  Laterality: N/A;   ESOPHAGOGASTRODUODENOSCOPY (EGD) WITH PROPOFOL N/A 01/11/2022   Procedure: ESOPHAGOGASTRODUODENOSCOPY (EGD) WITH PROPOFOL;  Surgeon: [name], MD;  Location: [name] ENDOSCOPY;  Service: Endoscopy;  Laterality: N/A;   FLEXIBLE BRONCHOSCOPY Bilateral  05/17/2019   Procedure: FLEXIBLE BRONCHOSCOPY;  Surgeon: [name], MD;  Location: [name] ORS;  Service: Pulmonary;  Laterality: Bilateral;   left arm surgery     nephrectomy Left    PARATHYROIDECTOMY     RIGHT HEART CATH N/A 03/29/2019   Procedure: RIGHT HEART CATH;  Surgeon: [name], MD;  Location: [facility];  Service: Cardiovascular;  Laterality: N/A;    Social History   Socioeconomic History   Marital status: Single    Spouse name: Not on file   Number of children: Not on file   Years of  education: Not on file   Highest education level: Not on file  Occupational History   Not on file  Tobacco Use   Smoking status: Former    Types: Cigarettes    Quit date: 03/13/2020    Years since quitting: 2.3   Smokeless tobacco: Former    Types: Nurse, children's Use: Never used  Substance and Sexual Activity   Alcohol use: No   Drug use: No   Sexual activity: Not Currently  Other Topics Concern   Not on file  Social History Narrative   Not on file   Social Determinants of Health   Financial Resource Strain: Low Risk  (05/12/2021)   Overall Financial Resource Strain (CARDIA)    Difficulty of Paying Living Expenses: Not very hard  Food Insecurity: No Food Insecurity (08/01/2022)   Hunger Vital Sign    Worried About Running Out of Food in the Last Year: Never true    Ran Out of Food in the Last Year: Never true  Transportation Needs: No Transportation Needs (08/01/2022)   PRAPARE - Hydrologist (Medical): No    Lack of Transportation (Non-Medical): No  Physical Activity: Not on file  Stress: Not on file  Social Connections: Not on file  Intimate Partner Violence: Not At Risk (08/01/2022)   Humiliation, Afraid, Rape, and Kick questionnaire    Fear of Current or Ex-Partner: No    Emotionally Abused: No    Physically Abused: No    Sexually Abused: No     Family History  Problem Relation Age of Onset   Diabetes Mother        2003   Lung cancer Father    Diabetes Sister    Hypertension Sister    Heart disease Sister    Cancer Sister    Bone cancer Brother    Prostate cancer Neg Hx    Bladder Cancer Neg Hx    Kidney cancer Neg Hx      Current Facility-Administered Medications:    acetaminophen (TYLENOL) tablet 650 mg, 650 mg, Oral, Q6H PRN, 650 mg at 08/01/22 0924 **OR** acetaminophen (TYLENOL) suppository 650 mg, 650 mg, Rectal, Q6H PRN, Jose Persia, MD   ascorbic acid (VITAMIN C) tablet 500 mg, 500 mg, Oral, Daily,  Val Riles, MD, 500 mg at 08/01/22 1532   carvedilol (COREG) tablet 12.5 mg, 12.5 mg, Oral, BID, Val Riles, MD, 12.5 mg at [id number]   folic acid (FOLVITE) tablet 1 mg, 1 mg, Oral, Daily, Val Riles, MD, 1 mg at 08/01/22 1532   hydrALAZINE (APRESOLINE) tablet 100 mg, 100 mg, Oral, TID, Val Riles, MD, 100 mg at 08/01/22 1532   insulin aspart (novoLOG) injection 0-9 Units, 0-9 Units, Subcutaneous, TID WC, Jose Persia, MD   insulin glargine-yfgn (SEMGLEE) injection 10 Units, 10 Units, Subcutaneous, Daily, Val Riles, MD  ipratropium-albuterol (DUONEB) 0.5-2.5 (3) MG/3ML nebulizer solution 3 mL, 3 mL, Nebulization, Q6H PRN, Jose Persia, MD   iron sucrose (VENOFER) 200 mg in sodium chloride 0.9 % 100 mL IVPB, 200 mg, Intravenous, Q24H, Val Riles, MD, Last Rate: 440 mL/hr at 08/01/22 1542, Infusion Verify at 08/01/22 1542   lidocaine (XYLOCAINE) 2 % viscous mouth solution 15 mL, 15 mL, Mouth/Throat, Once, Jose Persia, MD   mometasone-formoterol (DULERA) 200-5 MCG/ACT inhaler 2 puff, 2 puff, Inhalation, BID, Lorin Picket, RPH, 2 puff at 08/01/22 0920   ondansetron (ZOFRAN) tablet 4 mg, 4 mg, Oral, Q6H PRN **OR** ondansetron (ZOFRAN) injection 4 mg, 4 mg, Intravenous, Q6H PRN, Jose Persia, MD   pantoprazole (PROTONIX) injection 40 mg, 40 mg, Intravenous, Q12H, Val Riles, MD, 40 mg at 08/01/22 0919   polyethylene glycol (MIRALAX / GLYCOLAX) packet 17 g, 17 g, Oral, Daily PRN, Jose Persia, MD   rosuvastatin (CRESTOR) tablet 10 mg, 10 mg, Oral, QHS, Jose Persia, MD, 10 mg at 07/31/22 2133   sodium chloride flush (NS) 0.9 % injection 3 mL, 3 mL, Intravenous, Q12H, Jose Persia, MD, 3 mL at 08/01/22 0921   umeclidinium bromide (INCRUSE ELLIPTA) 62.5 MCG/ACT 1 puff, 1 puff, Inhalation, Daily, Lorin Picket, [facility]   Physical exam:  Vitals:   07/31/22 2132 08/01/22 0559 08/01/22 0732 08/01/22 1527  BP: (!) 155/73 (!) 155/70 (!) 163/66 128/72   Pulse: 88 93 66 69  Resp: 20 18 20 16  $ Temp: 98.4 F (36.9 C) 97.7 F (36.5 C)  97.9 F (36.6 C)  TempSrc:    Oral  SpO2: 98% 100% 100% 98%  Weight:  227 lb 4.8 oz (103.1 kg)    Height:  5' 11"$  (1.803 m)     Physical Exam Cardiovascular:     Rate and Rhythm: Normal rate and regular rhythm.     Heart sounds: Normal heart sounds.  Pulmonary:     Effort: Pulmonary effort is normal.     Breath sounds: Normal breath sounds.  Abdominal:     General: Bowel sounds are normal.     Palpations: Abdomen is soft.  Skin:    General: Skin is warm and dry.  Neurological:     Mental Status: He is alert and oriented to person, place, and time.           Latest Ref Rng & Units 08/01/2022    4:26 AM  CMP  Glucose 70 - 99 mg/dL 106   BUN 8 - 23 mg/dL 49   Creatinine 0.61 - 1.24 mg/dL 2.46   Sodium 135 - 145 mmol/L 136   Potassium 3.5 - 5.1 mmol/L 4.0   Chloride 98 - 111 mmol/L 104   CO2 22 - 32 mmol/L 23   Calcium 8.9 - 10.3 mg/dL 8.4       Latest Ref Rng & Units 08/01/2022    4:26 AM  CBC  WBC 4.0 - 10.5 K/uL 3.3   Hemoglobin 13.0 - 17.0 g/dL 8.1   Hematocrit 39.0 - 52.0 % 26.2   Platelets 150 - 400 K/uL 189     @IMAGES$ @  NM Bone Scan Whole Body  Result Date: 08/01/2022 CLINICAL DATA:  Further evaluation of T12 vertebral body lesion. Patient reports fall from bed with left-sided hip pain. EXAM: NUCLEAR MEDICINE WHOLE BODY BONE SCAN TECHNIQUE: Whole body anterior and posterior images were obtained approximately 3 hours after intravenous injection of radiopharmaceutical. RADIOPHARMACEUTICALS:  21.42 mCi Technetium-35mMDP IV COMPARISON:  CT chest dated 08/01/2022  FINDINGS: No focal increased uptake at the level of T12 to correspond to finding on prior chest CT. Multilevel uptake involving the posterior costovertebral junctions, likely degenerative. Focal uptake involving the left greater trochanter may correspond to reported posttraumatic injury. Multifocal areas of periarticular  uptake, for example involving the bilateral shoulders, knees, ankles, and left great toe are in a pattern typical for arthropathy. IMPRESSION: 1. No focal radiotracer uptake at the level of T12 to correspond to finding on prior chest CT. 2. Focal uptake involving the left greater trochanter, likely posttraumatic. Electronically Signed   By: Darrin Nipper M.D.   On: 08/01/2022 15:27   CT CHEST WO CONTRAST  Result Date: 08/01/2022 CLINICAL DATA:  Occult malignancy. EXAM: CT CHEST WITHOUT CONTRAST TECHNIQUE: Multidetector CT imaging of the chest was performed following the standard protocol without IV contrast. RADIATION DOSE REDUCTION: This exam was performed according to the departmental dose-optimization program which includes automated exposure control, adjustment of the mA and/or kV according to patient size and/or use of iterative reconstruction technique. COMPARISON:  Chest radiography yesterday. Previous chest CT 05/16/2022. FINDINGS: Cardiovascular: Mild cardiac enlargement. Coronary artery calcification and aortic atherosclerotic calcification are present. Mediastinum/Nodes: No mediastinal or hilar mass or adenopathy visible on this study done without contrast. Lungs/Pleura: There are small bilateral pleural effusions layering dependently. There is patchy density in the posteroinferior lower lobes that could be atelectasis subsequent to the fusions or could indicate mild basilar pneumonia. Atelectasis is favored. Findings are slightly more pronounced on the left than the right. No evidence bullous emphysema. No mass or nodule to indicate primary or metastatic disease. Upper Abdomen: See results abdominal CT yesterday. Musculoskeletal: Chronic thoracic fusion in the mid and lower thoracic region. Indistinct sclerotic change within the T12 vertebral body, progressive since studies as distant as 2020. No sign of lytic destructive change or extraosseous soft tissue tumor. The overall findings look  indolent/benign, but progressive change over the last 4 years does raise some concern. If the patient has a malignancy that could result in sclerotic bone metastases, that possibility should be considered. IMPRESSION: 1. Small bilateral pleural effusions layering dependently. Patchy density in the posteroinferior lower lobes that could be atelectasis subsequent to the effusions or could indicate mild basilar pneumonia. Findings are slightly more pronounced on the left than the right. 2. Mild cardiac enlargement. Coronary artery calcification and aortic atherosclerotic calcification. 3. Indistinct sclerotic change within the T12 vertebral body, progressive since studies as distant as 2020. No sign of lytic destructive change or extraosseous soft tissue tumor. The overall findings look indolent/benign, but progressive change over the last 4 years does raise some concern. If the patient has a malignancy that could result in sclerotic bone metastases, that possibility should be considered. 4. Aortic atherosclerosis. 5. Emphysema. Aortic Atherosclerosis (ICD10-I70.0) and Emphysema (ICD10-J43.9). Electronically Signed   By: Nelson Chimes M.D.   On: 08/01/2022 13:24   CT ABDOMEN PELVIS WO CONTRAST  Result Date: 07/31/2022 CLINICAL DATA:  Left flank bruising. Evaluate for bleed. Patient also complains of vomiting this morning. EXAM: CT ABDOMEN AND PELVIS WITHOUT CONTRAST TECHNIQUE: Multidetector CT imaging of the abdomen and pelvis was performed following the standard protocol without IV contrast. RADIATION DOSE REDUCTION: This exam was performed according to the departmental dose-optimization program which includes automated exposure control, adjustment of the mA and/or kV according to patient size and/or use of iterative reconstruction technique. COMPARISON:  Diffuse severe parenchymal volume loss and mild fatty infiltration. The main pancreatic duct is not dilated. No peripancreatic  fat stranding or fluid collection.  FINDINGS: Lower chest: Trace pleural fluid bilaterally with adjacent dependent atelectasis. Calcified granuloma in the right middle lobe. Hypodense blood pool, suggestive of anemia. Hepatobiliary: No focal liver abnormality is seen. Status post cholecystectomy. No biliary dilatation. Pancreas: Diffuse severe parenchymal atrophy and mild fatty infiltration. The main pancreatic duct is not dilated. No peripancreatic fat stranding or fluid collection. Spleen: Normal in size without focal abnormality. Multiple calcified granulomas scattered throughout the spleen. Adrenals/Urinary Tract: Left adrenal mass measures 8.6 x 5.6 cm, previously 8.3 x 6.0 cm on 02/22/2022 (series 2, image 25). Coarse calcification again noted along the edge of the mass. Right adrenal gland is unremarkable. Postoperative changes of left nephrectomy. Heterogeneous mass at the inferior pole of the right kidney, best visualized on sagittal and coronal series measures approximately 6.2 x 4.9 cm, not significantly changed compared to 02/22/2022 when measured in a similar fashion but increased in size compared to 12/10/2019 when it measured approximately 5.2 x 3.7 cm (series 6, image 58). No right hydronephrosis or nephrolithiasis. Several fluid density benign cortical cysts again noted in the right kidney. Mildly distended urinary bladder is unremarkable. Stomach/Bowel: Stomach is within normal limits. Appendix appears normal. Diverticulosis of the descending and sigmoid colon. No evidence of bowel wall thickening, distention, or inflammatory changes. Vascular/Lymphatic: Aortic atherosclerosis. No enlarged abdominal or pelvic lymph nodes. Reproductive: Prostate is unremarkable. Other: Fat containing 7.8 cm right umbilical hernia (series 2, image 60). A 5.7 cm left lumbar hernia contains a small knuckle of small bowel and fat (series 2, image 36). Mild subcutaneous fat stranding is noted in the region of the left flank. No focal fluid collection.  Musculoskeletal: A 2.1 x 2.0 cm sclerotic lesion with ill-defined margins is noted in the posterior aspect of the T12 vertebral body (series 2, image 25). No acute osseous findings. Flowing anterior osteophytes in the visualized distal thoracic spine, consistent with diffuse idiopathic skeletal hyperostosis. Vacuum disc phenomenon at L2-L3 and L4-L5. IMPRESSION: 1. Mild subcutaneous fat stranding in the left flank without focal fluid collection. No CT findings on this unenhanced exam to suggest active bleed as queried. 2. A 6.2 cm heterogeneous mass at the inferior pole of the right kidney is not significantly changed compared to 02/22/2022 but is increased in size compared to 12/10/2019. This remains concerning for renal cell carcinoma. When the patient is clinically stable and able to follow directions and hold their breath (preferably as an outpatient) further evaluation with dedicated abdominal MRI should be considered. 3. Indeterminate 2.1 cm sclerotic lesion with ill-defined margins is noted in the T12 vertebral body. This is concerning for possible osseous metastatic disease. Recommend nuclear medicine bone scan for further evaluation. 4. Trace pleural fluid bilaterally with adjacent dependent atelectasis. 5. Hypodense blood pool, suggestive of anemia. 6. Other chronic findings as described in the body of the report. 7. Aortic atherosclerosis. Aortic Atherosclerosis (ICD10-I70.0). Electronically Signed   By: Ileana Roup M.D.   On: 07/31/2022 15:21   CT Cervical Spine Wo Contrast  Result Date: 07/31/2022 CLINICAL DATA:  Neck trauma. EXAM: CT CERVICAL SPINE WITHOUT CONTRAST TECHNIQUE: Multidetector CT imaging of the cervical spine was performed without intravenous contrast. Multiplanar CT image reconstructions were also generated. RADIATION DOSE REDUCTION: This exam was performed according to the departmental dose-optimization program which includes automated exposure control, adjustment of the mA and/or  kV according to patient size and/or use of iterative reconstruction technique. COMPARISON:  05/04/2022 FINDINGS: Alignment: Normal. Skull base and vertebrae: No acute fracture.  No primary bone lesion or focal pathologic process. Soft tissues and spinal canal: No prevertebral fluid or swelling. No visible canal hematoma. Disc levels: Disc space narrowing with marginal osteophyte formation C2-3 through C5-6. Upper chest: Negative. IMPRESSION: Degenerative changes.  No acute traumatic abnormalities. Electronically Signed   By: Sammie Bench M.D.   On: 07/31/2022 14:51   CT HEAD WO CONTRAST (5MM)  Result Date: 07/31/2022 CLINICAL DATA:  Head trauma. EXAM: CT HEAD WITHOUT CONTRAST TECHNIQUE: Contiguous axial images were obtained from the base of the skull through the vertex without intravenous contrast. RADIATION DOSE REDUCTION: This exam was performed according to the departmental dose-optimization program which includes automated exposure control, adjustment of the mA and/or kV according to patient size and/or use of iterative reconstruction technique. COMPARISON:  05/16/2022 FINDINGS: Brain: There is periventricular white matter decreased attenuation consistent with small vessel ischemic changes. Gray-white differentiation is preserved. No acute intracranial hemorrhage, mass effect or shift. No hydrocephalus. Vascular: No hyperdense vessel or unexpected calcification. Skull: Normal. Negative for fracture or focal lesion. Sinuses/Orbits: Mucoperiosteal thickening consistent with chronic maxillary and ethmoid sinusitis. IMPRESSION: Periventricular white matter changes consistent with chronic small vessel ischemia. No acute intracranial process identified. Electronically Signed   By: Sammie Bench M.D.   On: 07/31/2022 14:48   DG Chest 2 View  Result Date: 07/31/2022 CLINICAL DATA:  Shortness of breath.  Wound to right elbow. EXAM: CHEST - 2 VIEW COMPARISON:  07/21/2022 FINDINGS: Cardiac enlargement. Aortic  atherosclerosis. Pulmonary vascular congestion. Blunting of the costophrenic angle suggests small pleural effusions. No airspace opacities identified. Degenerative disc disease noted within the thoracic spine. IMPRESSION: Cardiac enlargement and pulmonary vascular congestion. Small bilateral pleural effusions suspected. Electronically Signed   By: Kerby Moors M.D.   On: 07/31/2022 13:07   DG Chest 2 View  Result Date: 07/21/2022 CLINICAL DATA:  Chest pain EXAM: CHEST - 2 VIEW COMPARISON:  07/14/2022 FINDINGS: Normal heart size, mediastinal contours, and pulmonary vascularity. Lungs clear. No pulmonary infiltrate or pneumothorax. Tiny LEFT pleural effusion blunts LEFT costophrenic Rotated to the RIGHT. No acute osseous findings. IMPRESSION: No acute abnormalities. Electronically Signed   By: Lavonia Dana M.D.   On: 07/21/2022 13:21   DG Chest 2 View  Result Date: 07/14/2022 CLINICAL DATA:  Short of breath EXAM: CHEST - 2 VIEW COMPARISON:  05/16/2022 FINDINGS: Frontal and lateral views of the chest demonstrate an unremarkable cardiac silhouette. No airspace disease, effusion, or pneumothorax. No acute bony abnormality. IMPRESSION: 1. No acute intrathoracic process. Electronically Signed   By: Randa Ngo M.D.   On: 07/14/2022 21:21   DG Knee Complete 4 Views Right  Result Date: 07/14/2022 CLINICAL DATA:  Knee pain EXAM: RIGHT KNEE - COMPLETE 4+ VIEW COMPARISON:  Radiographs 10/15/2020 FINDINGS: No acute fracture or dislocation. Tricompartmental degenerative arthritis right knee with moderate medial compartment narrowing. Small knee joint effusion. IMPRESSION: No acute fracture or dislocation. Electronically Signed   By: Placido Sou M.D.   On: 07/14/2022 20:10    Assessment and plan- Patient is a 67 y.o. male with multiple medical problems admitted for possible upper GI bleed found to have chronic right renal mass, left adrenal mass and T12 sclerotic bony lesion   T12 sclerotic bone lesion:  Patient had a bone scan today which did not show any increased uptake at the T12 area corresponding to prior CT.  However given that the lesion was close to 2 cm on CT I will discuss with IR if this can be biopsied.  On CT chest the T12 vertebral body appeared progressive but present even back in 2020.  No sign of lytic destructive change suggestive of malignancy and overall findings favor benign pathology.  Possible renal cell carcinoma: He is not a candidate for nephrectomy per urology.  CT chest also did not show any metastatic disease.  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  Can continue to be monitored.  Possible upper GI bleed- EGD tomorrow  I will discuss his case at tumor board this week to decide about further plan of care   Thank you for this kind referral and the opportunity to participate in the care of this patient   Visit Diagnosis 1. Upper GI bleed     Dr. Randa Evens, MD, MPH [facility] at [facility] [id number] 08/01/2022

## 2022-08-02 ENCOUNTER — Encounter
Admission: EM | Disposition: A | Discharge status: Home / Self Care | Payer: Self-pay | Source: Home / Self Care | Attending: Emergency Medicine

## 2022-08-02 ENCOUNTER — Observation Stay: Payer: Medicare PPO | Admitting: Anesthesiology

## 2022-08-02 ENCOUNTER — Encounter: Payer: Self-pay | Admitting: Internal Medicine

## 2022-08-02 DIAGNOSIS — K2270 Barrett's esophagus without dysplasia: Secondary | ICD-10-CM | POA: Diagnosis not present

## 2022-08-02 DIAGNOSIS — N183 Chronic kidney disease, stage 3 unspecified: Secondary | ICD-10-CM | POA: Diagnosis not present

## 2022-08-02 DIAGNOSIS — I5022 Chronic systolic (congestive) heart failure: Secondary | ICD-10-CM | POA: Diagnosis not present

## 2022-08-02 DIAGNOSIS — Z87891 Personal history of nicotine dependence: Secondary | ICD-10-CM | POA: Diagnosis not present

## 2022-08-02 DIAGNOSIS — I130 Hypertensive heart and chronic kidney disease with heart failure and stage 1 through stage 4 chronic kidney disease, or unspecified chronic kidney disease: Secondary | ICD-10-CM | POA: Diagnosis not present

## 2022-08-02 DIAGNOSIS — K920 Hematemesis: Secondary | ICD-10-CM | POA: Diagnosis not present

## 2022-08-02 DIAGNOSIS — K922 Gastrointestinal hemorrhage, unspecified: Secondary | ICD-10-CM | POA: Diagnosis not present

## 2022-08-02 HISTORY — PX: ESOPHAGOGASTRODUODENOSCOPY (EGD) WITH PROPOFOL: SHX5813

## 2022-08-02 LAB — BASIC METABOLIC PANEL
Anion gap: 5 (ref 5–15)
BUN: 41 mg/dL — ABNORMAL HIGH (ref 8–23)
CO2: 23 mmol/L (ref 22–32)
Calcium: 8 mg/dL — ABNORMAL LOW (ref 8.9–10.3)
Chloride: 106 mmol/L (ref 98–111)
Creatinine, Ser: 2.2 mg/dL — ABNORMAL HIGH (ref 0.61–1.24)
GFR, Estimated: 32 mL/min — ABNORMAL LOW (ref >60)
Glucose, Bld: 99 mg/dL (ref 70–99)
Potassium: 3.9 mmol/L (ref 3.5–5.1)
Sodium: 134 mmol/L — ABNORMAL LOW (ref 135–145)

## 2022-08-02 LAB — GLUCOSE, CAPILLARY
Glucose-Capillary: 96 mg/dL (ref 70–99)
Glucose-Capillary: 88 mg/dL (ref 70–99)
Glucose-Capillary: 102 mg/dL — ABNORMAL HIGH (ref 70–99)
Glucose-Capillary: 106 mg/dL — ABNORMAL HIGH (ref 70–99)
Glucose-Capillary: 199 mg/dL — ABNORMAL HIGH (ref 70–99)
Glucose-Capillary: 159 mg/dL — ABNORMAL HIGH (ref 70–99)

## 2022-08-02 LAB — CBC
HCT: 25.8 % — ABNORMAL LOW (ref 39.0–52.0)
Hemoglobin: 8.1 g/dL — ABNORMAL LOW (ref 13.0–17.0)
MCH: 26.8 pg (ref 26.0–34.0)
MCHC: 31.4 g/dL (ref 30.0–36.0)
MCV: 85.4 fL (ref 80.0–100.0)
Platelets: 175 10*3/uL (ref 150–400)
RBC: 3.02 MIL/uL — ABNORMAL LOW (ref 4.22–5.81)
RDW: 15.9 % — ABNORMAL HIGH (ref 11.5–15.5)
WBC: 3 10*3/uL — ABNORMAL LOW (ref 4.0–10.5)
nRBC: 0 % (ref 0.0–0.2)

## 2022-08-02 LAB — PHOSPHORUS: Phosphorus: 4.2 mg/dL (ref 2.5–4.6)

## 2022-08-02 LAB — MAGNESIUM: Magnesium: 1.7 mg/dL (ref 1.7–2.4)

## 2022-08-02 SURGERY — ESOPHAGOGASTRODUODENOSCOPY (EGD) WITH PROPOFOL
Anesthesia: General

## 2022-08-02 MED ORDER — HYDRALAZINE HCL 20 MG/ML IJ SOLN: 10.0000 mg | Freq: Four times a day (QID) | INTRAMUSCULAR | Status: DC | PRN

## 2022-08-02 MED ORDER — SODIUM CHLORIDE 0.9 % IV SOLN: INTRAVENOUS | Status: DC | PRN

## 2022-08-02 MED ORDER — PANTOPRAZOLE SODIUM 40 MG PO TBEC
40.0000 mg | DELAYED_RELEASE_TABLET | Freq: Two times a day (BID) | ORAL | Status: AC
  Administered 2022-08-02: 40 mg via ORAL
  Filled 2022-08-02: qty 1

## 2022-08-02 MED ORDER — LIDOCAINE HCL (CARDIAC) PF 100 MG/5ML IV SOSY
PREFILLED_SYRINGE | INTRAVENOUS | Status: DC | PRN
  Administered 2022-08-02: 100 mg via INTRAVENOUS

## 2022-08-02 MED ORDER — PANTOPRAZOLE SODIUM 40 MG PO TBEC
40.0000 mg | DELAYED_RELEASE_TABLET | Freq: Every day | ORAL | Status: DC
  Administered 2022-08-03: 40 mg via ORAL
  Filled 2022-08-02: qty 1

## 2022-08-02 MED ORDER — PROPOFOL 10 MG/ML IV BOLUS
INTRAVENOUS | Status: DC | PRN
  Administered 2022-08-02: 90 mg via INTRAVENOUS

## 2022-08-02 MED ORDER — PROPOFOL 10 MG/ML IV BOLUS
INTRAVENOUS | Status: AC
  Filled 2022-08-02: qty 20

## 2022-08-02 MED ORDER — PROPOFOL 500 MG/50ML IV EMUL
INTRAVENOUS | Status: DC | PRN
  Administered 2022-08-02: 175 ug/kg/min via INTRAVENOUS

## 2022-08-02 NOTE — Progress Notes (Signed)
T12 lesion appears chronic per IR dating back to 2013 and likely benign. Adrenal mass is also slow growing renal mass is likely RCC but he is not a surgical candidate. He will f/u with urology in the future. No oncologyf/u needed  Dr. Randa Evens, MD, MPH [facility] at [facility] [phone] 08/02/2022 12:42 PM

## 2022-08-02 NOTE — Progress Notes (Signed)
Mobility Specialist - Progress Note   08/02/22 1521  Mobility  Activity Ambulated independently to bathroom  Level of Assistance Modified independent, requires aide device or extra time  Assistive Device None  Distance Ambulated (ft) 4 ft  Activity Response Tolerated well  $Mobility charge 1 Mobility   MS responding to bed alarm. Pt standing at doorway having an active BM upon entry, utilizing RA. Pt amb ModI without AD to the bathroom. Pt left sitting on commode with needs within reach.   Candie Mile Mobility Specialist 08/02/22 3:24 PM

## 2022-08-02 NOTE — Transfer of Care (Signed)
Immediate Anesthesia Transfer of Care Note  Patient: Marcellis Amado Gensel  Procedure(s) Performed: ESOPHAGOGASTRODUODENOSCOPY (EGD) WITH PROPOFOL  Patient Location: Endoscopy Unit  Anesthesia Type:General  Level of Consciousness: drowsy  Airway & Oxygen Therapy: Patient Spontanous Breathing  Post-op Assessment: Report given to RN and Post -op Vital signs reviewed and stable  Post vital signs: Reviewed and stable  Last Vitals:  Vitals Value Taken Time  BP 116/76 08/02/22 1044  Temp    Pulse 98 08/02/22 1049  Resp 18 08/02/22 1049  SpO2 100 % 08/02/22 1049  Vitals shown include unvalidated device data.  Last Pain:  Vitals:   08/02/22 0750  TempSrc: Oral  PainSc: 0-No pain         Complications: No notable events documented.

## 2022-08-02 NOTE — Anesthesia Postprocedure Evaluation (Signed)
Anesthesia Post Note  Patient: Larry Callahan  Procedure(s) Performed: ESOPHAGOGASTRODUODENOSCOPY (EGD) WITH PROPOFOL  Patient location during evaluation: Endoscopy Anesthesia Type: General Level of consciousness: awake and alert Pain management: pain level controlled Vital Signs Assessment: post-procedure vital signs reviewed and stable Respiratory status: spontaneous breathing, nonlabored ventilation, respiratory function stable and patient connected to nasal cannula oxygen Cardiovascular status: blood pressure returned to baseline and stable Postop Assessment: no apparent nausea or vomiting Anesthetic complications: no   No notable events documented.   Last Vitals:  Vitals:   08/02/22 1100 08/02/22 1120  BP: 121/71 (!) 147/84  Pulse: 68 69  Resp: 15 16  Temp:  (!) 36.4 C  SpO2: 97% 98%    Last Pain:  Vitals:   08/02/22 1120  TempSrc: Oral  PainSc: 0-No pain                 Arita Miss

## 2022-08-02 NOTE — Progress Notes (Signed)
Request received for T12 spinal lesion. After review of imaging, Dr. Denna Haggard noted spinal lesion on imaging dating back to 2013 with minimal growth and feels it is most likely a benign process. He recommends no biopsy at this time. Dr. Denna Haggard made notified ordering team of findings.    Narda Rutherford, AGNP-BC 08/02/2022, 4:20 PM

## 2022-08-02 NOTE — Care Plan (Signed)
Patient s/p procedure tolerated well Report phone to RN Report details in chart AVSS - patient awake and alert

## 2022-08-02 NOTE — Progress Notes (Addendum)
[facility] Progress Note  Patient: Larry Callahan    [id number]  DOA: 07/31/2022     Date of Service: the patient was seen and examined on 08/02/2022  Chief Complaint  Patient presents with   Wound Check   Brief hospital course: Larry Callahan is a 67 y.o. male with medical history significant of CAD, COPD, CKD stage III, nonischemic cardiomyopathy (last EF 40-45%, 07/2021), hypertension, OSA, hyperlipidemia, who presents to the ED due to a wound check.  Right elbow wound developed 2 weeks ago, not healing well, in the morning time patient was feeling nausea and vomiting, had hematemesis.  Patient was also feeling short of breath.  Patient fell off the bed 2 days ago, found to have bruised right lower back. ED workup, atrial flutter, rate controlled, BP controlled.  CKD stage IV, creatinine at baseline, BNP 230 slightly elevated, anemia Hb 8.3, COVID and flu negative.  CT head and C-spine negative for any acute findings CT abdomen pelvis shows right renal mass, T12 lesion. CXR: Cardiac enlargement and pulmonary vascular congestion. Small bilateral pleural effusions suspected.   Assessment and Plan:  # Acute upper GI bleeding # Acute blood loss anemia, Hb 11.12 months ago, presented with Hb 8.3 Patient presented with hematemesis and chest pain could be due to vomiting. 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  GI signed off Monitor H&H and transfuse if hemoglobin less than 7   Renal mass, right and T12 vertebral sclerotic lesion CT imaging obtained today demonstrated increased size of heterogenous mass in the inferior pole of the right kidney compared to 2021. Concerning for underlying renal cell carcinoma. In  addition, there is evidence of a 2.1 cm sclerotic lesion in the T12 vertebrae.  Bone scan negative for any acute significant findings CT chest: Small bilateral pleural effusion L>R, emphysema, T12 lesion, CAD Oncology consulted, rec Bone scan, CT chest and IR CT-guided biopsy of T12 lesion  2/20 d/w IR reg T12 lesion, it is chronic since 2013, very slow-growing, most likely benign.  Risk of biopsy outweigh the benefit.  Oncologist signed off, patient will follow-up with urologist for MRI abdomen as an outpatient.    Anemia multifactorial, iron deficiency anemia and upper GI bleeding Continue to monitor H&H and transfuse if hemoglobin less than 7 Iron level 35, transferrin saturation 16% Venofer 200 mg IV daily for 5 days, oral iron supplement at discharge Folic acid 7.9, at lower end, started oral supplement.   Atrial flutter, rate controlled Held Eliquis due to upper GI bleeding and left flank and back hematoma. Hold DOAC for at least 7 days.  Risk of bleeding outweighs the benefit. Recommend to follow-up with cardiologist as an outpatient for possible Watchman device if patient cannot tolerate DOAC. Continue monitor on telemetry Coreg 12.5 mg p.o. twice daily   HTN, HLD, chronic systolic CHF LVEF 40 to [id number] No exacerbation noticed, patient is euvolemic S/p Lasix 80 mg x 1 dose given in the ED Continue Coreg, hydralazine, Crestor Continue fluid restriction 1.5 L/day Monitor BP and titrate medications accordingly   CKD stage IV Baseline creatinine 2.4--2.6  Cr 2.46--2.2 stable Monitor renal functions daily,  avoid nephrotoxic medications, use renal dose medication.   Diabetes mellitus type 2 Held home medications for now continue [facility]  10 units daily, NovoLog sliding scale Monitor CBG, continue diabetic diet  COPD, no exacerbation noticed on exam Continue Dulera inhaler and Incruse Ellipta inhaler Saturating well on room air   Right elbow wound, chronic, patient was  on doxycycline as an outpatient which has been stopped. No signs of infection Wound care RN was consulted, continue dressing   Hematoma of left flank atient presenting with large left flank hematoma after rolling off of his bed while sleeping.  CT with subcutaneous fat stranding but no focal fluid collection. -Monitor CBC as noted above   Body mass index is 31.7 kg/m.  Interventions:  Diet: Diabetic diet, fluid restriction 1.5 L/day DVT Prophylaxis: SCD, pharmacological prophylaxis contraindicated due to GI bleeding    Advance goals of care discussion: Full code  Family Communication: family was not present at bedside, at the time of interview.  The pt provided permission to discuss medical plan with the family. Opportunity was given to ask question and all questions were answered satisfactorily.   Disposition:  Pt is from Home, admitted with hematemesis, right elbow wound, renal mass and T12 lesion, s/p EGD today, which precludes a safe discharge. Discharge to Home, when clinically stable, may discharge home tomorrow a.m.   Subjective: No significant overnight events.  Patient was hungry, waiting for EGD procedure.  Denies any active issues, no chest pain or palpitation, no shortness of breath, no more GI bleeding.   Physical Exam: General: NAD, lying comfortably Appear in no distress, affect appropriate Eyes: PERRLA ENT: Oral Mucosa Clear, moist  Neck: no JVD,  Cardiovascular: Atrial flutter, irregular rhythm, no Murmur,  Respiratory: good respiratory effort, Bilateral Air entry equal and Decreased, no Crackles, no wheezes Abdomen: Bowel Sound present, Soft and no tenderness,  Skin: no rashes Extremities: no Pedal edema, no calf tenderness Neurologic: without any new focal findings Gait not checked due to patient safety concerns  Vitals:   08/02/22 1050 08/02/22 1054 08/02/22 1100 08/02/22 1120  BP: 116/76 113/79 121/71 (!) 147/84  Pulse: 98 85 68 69  Resp: 18 17 15  16  $ Temp:    (!) 97.5 F (36.4 C)  TempSrc:    Oral  SpO2: 100% 97% 97% 98%  Weight:      Height:        Intake/Output Summary (Last 24 hours) at 08/02/2022 1143 Last data filed at 08/02/2022 1038 Gross per 24 hour  Intake 133.26 ml  Output 1300 ml  Net -1166.74 ml   Filed Weights   08/01/22 0559 08/02/22 0500  Weight: 103.1 kg 103.1 kg    Data Reviewed: I have personally reviewed and interpreted daily labs, tele strips, imagings as discussed above. I reviewed all nursing notes, pharmacy notes, vitals, pertinent old records I have discussed plan of care as described above with RN and patient/family.  CBC: Recent Labs  Lab 07/31/22 1117 07/31/22 1904 08/01/22 0426 08/02/22 0332  WBC 3.4* 3.9* 3.3* 3.0*  NEUTROABS 2.3  --   --   --   HGB 8.3* 8.6* 8.1* 8.1*  HCT 26.4* 27.6* 26.2* 25.8*  MCV 86.8 86.0 85.9 85.4  PLT 183 196 189 0000000   Basic Metabolic Panel: Recent Labs  Lab 07/31/22 1117 08/01/22 0426 08/02/22 0332  NA 139 136 134*  K 4.4 4.0 3.9  CL 113* 104 106  CO2 21* 23 23  GLUCOSE 130* 106* 99  BUN 52* 49* 41*  CREATININE 2.51* 2.46* 2.20*  CALCIUM 8.4* 8.4* 8.0*  MG  --   --  1.7  PHOS  --   --  4.2    Studies: NM Bone Scan Whole Body  Result Date: 08/01/2022 CLINICAL DATA:  Further evaluation of T12 vertebral body lesion. Patient reports fall from bed with left-sided hip pain. EXAM: NUCLEAR MEDICINE WHOLE BODY BONE SCAN TECHNIQUE: Whole body anterior and posterior images were obtained approximately 3 hours after intravenous injection of radiopharmaceutical. RADIOPHARMACEUTICALS:  21.42 mCi Technetium-51mMDP IV COMPARISON:  CT chest dated 08/01/2022 FINDINGS: No focal increased uptake at the level of T12 to correspond to finding on prior chest CT. Multilevel uptake involving the posterior costovertebral junctions, likely degenerative. Focal uptake involving the left greater trochanter may correspond to reported posttraumatic injury. Multifocal areas of  periarticular uptake, for example involving the bilateral shoulders, knees, ankles, and left great toe are in a pattern typical for arthropathy. IMPRESSION: 1. No focal radiotracer uptake at the level of T12 to correspond to finding on prior chest CT. 2. Focal uptake involving the left greater trochanter, likely posttraumatic. Electronically Signed   By: LDarrin NipperM.D.   On: 08/01/2022 15:27    Scheduled Meds:  ascorbic acid  500 mg Oral Daily   carvedilol  12.5 mg Oral BID   folic acid  1 mg Oral Daily   hydrALAZINE  100 mg Oral TID   insulin aspart  0-9 Units Subcutaneous TID WC   insulin glargine-yfgn  10 Units Subcutaneous Daily   lidocaine  15 mL Mouth/Throat Once   mometasone-formoterol  2 puff Inhalation BID   pantoprazole (PROTONIX) IV  40 mg Intravenous Q12H   rosuvastatin  10 mg Oral QHS   sodium chloride flush  3 mL Intravenous Q12H   umeclidinium bromide  1 puff Inhalation Daily   Continuous Infusions:  iron sucrose 440 mL/hr at 08/01/22 1542   PRN Meds: acetaminophen **OR** acetaminophen, hydrALAZINE, ipratropium-albuterol, ondansetron **OR** ondansetron (ZOFRAN) IV, polyethylene glycol  Time spent: 50 minutes  Author: DVal Riles MD [facility] 08/02/2022 11:43 AM  To reach On-call, see care teams to locate the attending and reach out to them via [email or web address] If 7PM-7AM, please contact night-coverage If you still have difficulty reaching the attending provider, please page the [facility](Director on Call) for [facility] on amion for assistance.

## 2022-08-02 NOTE — Anesthesia Preprocedure Evaluation (Signed)
Anesthesia Evaluation  Patient identified by MRN, date of birth, ID band Patient awake  General Assessment Comment:  No vomiting today, denies nausea. Not the best historian.  Reviewed: Allergy & Precautions, NPO status , Patient's Chart, lab work & pertinent test results  Airway Mallampati: II  TM Distance: >3 FB Neck ROM: full    Dental  (+) Poor Dentition, Missing, Chipped   Pulmonary sleep apnea , COPD, former smoker   Pulmonary exam normal breath sounds clear to auscultation       Cardiovascular Exercise Tolerance: Poor hypertension, + CAD and +CHF  Normal cardiovascular exam+ dysrhythmias  Rhythm:Regular Rate:Normal - Systolic murmurs [id number] MV: EF 38%. No signif ischemia. ? inf MI vs diaph attenuation. GI uptake artifact   Neuro/Psych  Headaches  negative psych ROS   GI/Hepatic Neg liver ROS,GERD  ,,  Endo/Other  negative endocrine ROSdiabetes    Renal/GU CRFRenal disease     Musculoskeletal   Abdominal Normal abdominal exam  (+)   Peds  Hematology negative hematology ROS (+)   Anesthesia Other Findings Dizziness, HA   Past Medical History: No date: Allergy No date: Atrial flutter ([place])     Comment:  a. Dx 12/2020--CHA2DS2VASc = 5-->eliquis. No date: Chronic combined systolic (congestive) and diastolic  (congestive) heart failure ([place])     Comment:  a. 04/2019 Echo: EF 45-50%; b. 02/2019 Echo: EF 55-60%;               c. 09/2020 Echo: EF 35-40%, glob HK. Nl RV size/fxn. Mild               MR; d. 07/2021 Echo: EF 40-45%, mild LVH, low-nl RV fxn,               no MR. No date: CKD (chronic kidney disease), stage III (HCC) No date: COPD (chronic obstructive pulmonary disease) ([place]) No date: Coronary artery disease     Comment:  a. 2011 Cath: nonobs dzs; b. 09/2020 MV: EF 38%. No               signif ischemia. ? inf MI vs diaph attenuation. GI uptake              artifact. No date: Diabetes mellitus without  complication (HCC) No date: GERD (gastroesophageal reflux disease) 09/25/2020: History of CVA (cerebrovascular accident) No date: Hyperlipidemia No date: Hypertension 06/09/2017: Morbid (severe) obesity due to excess calories ([place]) No date: Morbid obesity ([place]) No date: NICM (nonischemic cardiomyopathy) ([place])     Comment:  a. 04/2019 Echo: EF 45-50%; b. 02/2019 Echo: EF 55-60%;               c. 09/2020 Echo: EF 35-40%, glob HK; d. 09/2020 MV: No               ischemia. ? inf infarct vs attenuation; d. 07/2021 Echo:               EF 40-45%. No date: OSA (obstructive sleep apnea) 01/30/2020: Pneumonia due to COVID-19 virus No date: Stage 3b chronic kidney disease ([place]) 11/16/2020: Syncope and collapse  Past Surgical History: No date: BACK SURGERY No date: CARDIAC CATHETERIZATION No date: CHOLECYSTECTOMY 04/12/2019: COLONOSCOPY WITH PROPOFOL; N/A     Comment:  Procedure: COLONOSCOPY WITH PROPOFOL;  Surgeon: [name], MD;  Location: [name] ENDOSCOPY;  Service:  Gastroenterology;  Laterality: N/A; 04/12/2019: ESOPHAGOGASTRODUODENOSCOPY; N/A     Comment:  Procedure: ESOPHAGOGASTRODUODENOSCOPY (EGD);  Surgeon:               [name], MD;  Location[facility] ENDOSCOPY;                Service: Gastroenterology;  Laterality: N/A; 11/21/2019: ESOPHAGOGASTRODUODENOSCOPY (EGD) WITH PROPOFOL; N/A     Comment:  Procedure: ESOPHAGOGASTRODUODENOSCOPY (EGD) WITH               PROPOFOL;  Surgeon: [name], MD;  Location:               [name] ENDOSCOPY;  Service: Gastroenterology;  Laterality:               N/A; 05/17/2019: FLEXIBLE BRONCHOSCOPY; Bilateral     Comment:  Procedure: FLEXIBLE BRONCHOSCOPY;  Surgeon: [name], MD;  Location: [name] ORS;  Service: Pulmonary;                Laterality: Bilateral; No date: left arm surgery No date: nephrectomy; Left No date: PARATHYROIDECTOMY 03/29/2019: RIGHT HEART CATH; N/A     Comment:   Procedure: RIGHT HEART CATH;  Surgeon: [name], MD;  Location: [place] [facility] LAB;  Service:               Cardiovascular;  Laterality: N/A;  BMI    Body Mass Index: 33.52 kg/m      Reproductive/Obstetrics negative OB ROS                              Anesthesia Physical Anesthesia Plan  ASA: 3  Anesthesia Plan: General   Post-op Pain Management: Minimal or no pain anticipated   Induction: Intravenous  PONV Risk Score and Plan: 2 and Ondansetron, Treatment may vary due to age or medical condition, Propofol infusion and TIVA  Airway Management Planned: Natural Airway  Additional Equipment: None  Intra-op Plan:   Post-operative Plan:   Informed Consent: I have reviewed the patients History and Physical, chart, labs and discussed the procedure including the risks, benefits and alternatives for the proposed anesthesia with the patient or authorized representative who has indicated his/her understanding and acceptance.     Dental Advisory Given  Plan Discussed with: Anesthesiologist, CRNA and Surgeon  Anesthesia Plan Comments: (No nausea or vomiting today. Recent EGD for similar reason was normal. I believe it is reasonable to proceed with natural airway, just like his last anesthetic.  Discussed risks of anesthesia with patient, including possibility of difficulty with spontaneous ventilation under anesthesia necessitating airway intervention, PONV, aspiration, and rare risks such as cardiac or respiratory or neurological events, and allergic reactions. Discussed the role of CRNA in patient's perioperative care. Patient understands.)         Anesthesia Quick Evaluation

## 2022-08-02 NOTE — Progress Notes (Signed)
The patient underwent an EGD today without any sign of any old blood or new blood.  There is no sign of any recent bleeding.  The patient also had no ulcerations or inflammation.  Patient had what appeared to be Barrett's esophagus and a biopsy of the area was taken.  I will sign off.  Please call if any further GI concerns or questions.  We would like to thank you for the opportunity to participate in the care of Larry Callahan.

## 2022-08-02 NOTE — Op Note (Signed)
[facility] Patient Name: Larry Callahan Procedure Date: 08/02/2022 10:17 AM MRN: [id number] Account #: [id number] Date of Birth: March 25, 1956 Admit Type: Inpatient Age: 67 Room: [facility] ENDO ROOM 4 Gender: Male Note Status: Finalized Instrument Name: Upper Endoscope [id number] Procedure:             Upper GI endoscopy Indications:           Hematemesis Providers:             [name] MD, MD Referring MD:          [name] (Referring MD) Medicines:             Propofol per Anesthesia Complications:         No immediate complications. Procedure:             Pre-Anesthesia Assessment:                        - Prior to the procedure, a History and Physical was                         performed, and patient medications and allergies were                         reviewed. The patient's tolerance of previous                         anesthesia was also reviewed. The risks and benefits                         of the procedure and the sedation options and risks                         were discussed with the patient. All questions were                         answered, and informed consent was obtained. Prior                         Anticoagulants: The patient has taken no anticoagulant                         or antiplatelet agents. ASA Grade Assessment: II - A                         patient with mild systemic disease. After reviewing                         the risks and benefits, the patient was deemed in                         satisfactory condition to undergo the procedure.                        After obtaining informed consent, the endoscope was                         passed under direct vision. Throughout the procedure,  the patient's blood pressure, pulse, and oxygen                         saturations were monitored continuously. The Endoscope                         was introduced through the mouth, and advanced to  the                         second part of duodenum. The upper GI endoscopy was                         accomplished without difficulty. The patient tolerated                         the procedure well. Findings:      There were esophageal mucosal changes suspicious for long-segment       Barrett's esophagus present in the lower third of the esophagus. This       was biopsied with a cold forceps for histology.      The stomach was normal.      The examined duodenum was normal. Impression:            - Esophageal mucosal changes suspicious for                         long-segment Barrett's esophagus. Biopsied.                        - Normal stomach.                        - Normal examined duodenum. Recommendation:        - Resume previous diet.                        - Continue present medications.                        - Return patient to hospital ward for ongoing care. Procedure Code(s):     --- Professional ---                        [phone], Esophagogastroduodenoscopy, flexible,                         transoral; with biopsy, single or multiple Diagnosis Code(s):     --- Professional ---                        K92.0, Hematemesis CPT copyright 2022 [employer]. All rights reserved. The codes documented in this report are preliminary and upon coder review may  be revised to meet current compliance requirements. [name] MD, MD 08/02/2022 10:40:01 AM This report has been signed electronically. Number of Addenda: 0 Note Initiated On: 08/02/2022 10:17 AM Estimated Blood Loss:  Estimated blood loss: none.      [facility]

## 2022-08-03 ENCOUNTER — Encounter: Payer: Self-pay | Admitting: Gastroenterology

## 2022-08-03 DIAGNOSIS — G4733 Obstructive sleep apnea (adult) (pediatric): Secondary | ICD-10-CM | POA: Diagnosis not present

## 2022-08-03 DIAGNOSIS — K922 Gastrointestinal hemorrhage, unspecified: Secondary | ICD-10-CM | POA: Diagnosis not present

## 2022-08-03 LAB — BASIC METABOLIC PANEL WITH GFR
Anion gap: 8 (ref 5–15)
BUN: 43 mg/dL — ABNORMAL HIGH (ref 8–23)
CO2: 22 mmol/L (ref 22–32)
Calcium: 8.2 mg/dL — ABNORMAL LOW (ref 8.9–10.3)
Chloride: 107 mmol/L (ref 98–111)
Creatinine, Ser: 2.26 mg/dL — ABNORMAL HIGH (ref 0.61–1.24)
GFR, Estimated: 31 mL/min — ABNORMAL LOW
Glucose, Bld: 153 mg/dL — ABNORMAL HIGH (ref 70–99)
Potassium: 4.1 mmol/L (ref 3.5–5.1)
Sodium: 137 mmol/L (ref 135–145)

## 2022-08-03 LAB — CBC
HCT: 27.3 % — ABNORMAL LOW (ref 39.0–52.0)
Hemoglobin: 8.5 g/dL — ABNORMAL LOW (ref 13.0–17.0)
MCH: 27 pg (ref 26.0–34.0)
MCHC: 31.1 g/dL (ref 30.0–36.0)
MCV: 86.7 fL (ref 80.0–100.0)
Platelets: 185 K/uL (ref 150–400)
RBC: 3.15 MIL/uL — ABNORMAL LOW (ref 4.22–5.81)
RDW: 16.4 % — ABNORMAL HIGH (ref 11.5–15.5)
WBC: 3.9 K/uL — ABNORMAL LOW (ref 4.0–10.5)
nRBC: 0 % (ref 0.0–0.2)

## 2022-08-03 LAB — GLUCOSE, CAPILLARY
Glucose-Capillary: 150 mg/dL — ABNORMAL HIGH (ref 70–99)
Glucose-Capillary: 157 mg/dL — ABNORMAL HIGH (ref 70–99)

## 2022-08-03 LAB — SURGICAL PATHOLOGY

## 2022-08-03 LAB — MAGNESIUM: Magnesium: 1.8 mg/dL (ref 1.7–2.4)

## 2022-08-03 LAB — MISC LABCORP TEST (SEND OUT): Labcorp test code: 81950

## 2022-08-03 LAB — PHOSPHORUS: Phosphorus: 3.7 mg/dL (ref 2.5–4.6)

## 2022-08-03 MED ORDER — DOXYCYCLINE MONOHYDRATE 100 MG PO CAPS: 100.0000 mg | ORAL_CAPSULE | Freq: Two times a day (BID) | ORAL | 0 refills | Status: AC

## 2022-08-03 MED ORDER — POLYSACCHARIDE IRON COMPLEX 150 MG PO CAPS: 150.0000 mg | ORAL_CAPSULE | Freq: Every day | ORAL | 0 refills | Status: DC

## 2022-08-03 MED ORDER — OMEPRAZOLE 40 MG PO CPDR: 40.0000 mg | DELAYED_RELEASE_CAPSULE | Freq: Every day | ORAL | 1 refills | Status: DC

## 2022-08-03 MED ORDER — ASCORBIC ACID 500 MG PO TABS: 500.0000 mg | ORAL_TABLET | Freq: Every day | ORAL | 0 refills | Status: AC

## 2022-08-03 MED ORDER — VITAMIN D (ERGOCALCIFEROL) 1.25 MG (50000 UNIT) PO CAPS: 50000.0000 [IU] | ORAL_CAPSULE | ORAL | 0 refills | Status: DC

## 2022-08-03 MED ORDER — ACETAMINOPHEN 325 MG PO TABS: 650.0000 mg | ORAL_TABLET | Freq: Four times a day (QID) | ORAL | Status: DC | PRN

## 2022-08-03 MED ORDER — FOLIC ACID 1 MG PO TABS: 1.0000 mg | ORAL_TABLET | Freq: Every day | ORAL | 0 refills | Status: AC

## 2022-08-03 MED ORDER — VITAMIN D (ERGOCALCIFEROL) 1.25 MG (50000 UNIT) PO CAPS
50000.0000 [IU] | ORAL_CAPSULE | ORAL | Status: DC
  Filled 2022-08-03: qty 1

## 2022-08-03 NOTE — Progress Notes (Signed)
Discharge instructions were reviewed with patient. Questions were answered and encourage. IV was taken out by main RN. Belongings were collected by patient. Patient denies pain at this time.

## 2022-08-03 NOTE — Discharge Summary (Signed)
[facility] Discharge Summary   Patient: Larry Callahan [id number]  PCP: [name], NP  Date of admission: 07/31/2022   Date of discharge:  08/03/2022     Discharge Diagnoses:  Principal Problem:   Acute upper GI bleed Active Problems:   Acute on chronic systolic CHF (congestive heart failure) (HCC)   Cellulitis of right arm   Chest pain   Acute on chronic anemia   Hematoma of left flank   COPD (chronic obstructive pulmonary disease) ([place])   Essential hypertension   Type 2 diabetes mellitus ([place])   Atrial flutter ([place])   Renal mass   Admitted From: Home Disposition:  Home   Recommendations for Outpatient Follow-up:  Follow-up with PCP in 1 week, continue wound care.  Repeat CBC and BMP after 1 week Follow-up with GI as an outpatient if recurrent bleeding. Follow with a urologist for right renal mass workup, will need MRI as an outpatient. Follow up LABS/TEST: CBC and BMP in 1 week, MRI as an outpatient   Diet recommendation: Carb modified diet  Activity: The patient is advised to gradually reintroduce usual activities, as tolerated  Discharge Condition: stable  Code Status: Full code   History of present illness: As per the H and P dictated on admission Hospital Course:  Larry Callahan is a 67 y.o. male with medical history significant of CAD, COPD, CKD stage III, nonischemic cardiomyopathy (last EF 40-45%, 07/2021), hypertension, OSA, hyperlipidemia, who presents to the ED due to a wound check.  Right elbow wound developed 2 weeks ago, not healing well, in the morning time patient was feeling nausea and vomiting, had hematemesis.  Patient was also feeling short of breath.  Patient fell off the bed 2 days ago, found to have bruised right lower back. ED workup, atrial flutter, rate controlled, BP controlled.  CKD stage IV, creatinine at baseline, BNP 230 slightly elevated, anemia Hb 8.3, COVID and flu negative.  CT head and C-spine negative  for any acute findings CT abdomen pelvis shows right renal mass, T12 lesion. CXR: Cardiac enlargement and pulmonary vascular congestion. Small bilateral pleural effusions suspected.   Assessment and Plan: # Acute upper GI bleeding. Resolved # Acute blood loss anemia, Hb 11.69 months ago, presented with Hb 8.3--8.5 remained stable. Patient presented with hematemesis and chest pain could be due to vomiting. s/p pantoprazole 40 mg IV twice daily, transition to oral PPI on discharge.  Troponin negative, currently chest pain-free. S/p clear liquid diet, started diabetic diet. 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  GI signed off.  Patient had no more episodes of hematemesis. # Renal mass, right and T12 vertebral sclerotic lesion CT imaging obtained today demonstrated increased size of heterogenous mass in the inferior pole of the right kidney compared to 2021. Concerning for underlying renal cell carcinoma. In addition, there is evidence of a 2.1 cm sclerotic lesion in the T12 vertebrae.  Bone scan negative for any acute significant findings CT chest: Small bilateral pleural effusion L>R, emphysema, T12 lesion, CAD. Oncology consulted, rec Bone scan, CT chest and IR CT-guided biopsy of T12 lesion. On 2/20 d/w IR reg T12 lesion, it is chronic since 2013, very slow-growing, most likely benign.  Risk of biopsy outweigh the benefit.  Oncologist signed off, patient will follow-up with urologist for MRI abdomen as an outpatient. # Anemia multifactorial, iron deficiency anemia and upper GI bleeding. Iron level 35, transferrin saturation 16%, s/p Venofer 200  mg IV daily given during hospital stay, started on oral supplement on discharge. Folic acid 7.9, at lower end, started oral supplement. # Atrial flutter, rate controlled. Held Eliquis due to upper GI bleeding and left flank and back hematoma.  H&H remained stable, left flank bruise is resolving,  patient is having mild tenderness.  It seems safe to resume Eliquis.  Patient can follow-up with cardiology as an outpatient for possible Watchman device if unable to tolerate anticoagulation in future. Resumed Coreg 12.5 mg p.o. twice daily home dose. # HTN, HLD, chronic systolic CHF LVEF 40 to [id number], No exacerbation noticed, patient is euvolemic. S/p Lasix 80 mg x 1 dose given in the ED Continue Coreg, hydralazine, Crestor, Continue fluid restriction 1.5 L/day # CKD stage IV, Baseline creatinine 2.4--2.6, Cr 2.26 stable # Diabetes mellitus type 2, Held home medications during hospital stay. S/p Semglee 10 units daily, NovoLog sliding scale.  Continue diabetic diet, resumed home medications on discharge. # COPD, no exacerbation noticed on exam> Continue Dulera inhaler and Incruse Ellipta inhaler. Saturating well on room air # Right elbow wound, chronic, patient was on doxycycline as an outpatient which which was held but resumed on discharge for 5 additional days of antibiotics.  Continue mupirocin ointment, continue wound care. # Hematoma of left flank, Patient presenting with large left flank hematoma after rolling off of his bed while sleeping.  CT with subcutaneous fat stranding but no focal fluid collection.  H&H remained stable.  Eliquis was resumed on discharge.  Recommended to follow with PCP and repeat CBC after 1 week  Body mass index is 31.7 kg/m.  Nutrition Interventions:   Patient was ambulatory without any assistance. On the day of the discharge the patient's vitals were stable, and no other acute medical condition were reported by patient. the patient was felt safe to be discharge at Home.  Consultants: None Procedures: None  Discharge Exam: General: Appear in no distress, no Rash; Oral Mucosa Clear, moist. Cardiovascular: Irregular rhythm, no Murmur, Respiratory: normal respiratory effort, Bilateral Air entry present and no Crackles, no wheezes Abdomen: Bowel Sound present, Soft  and no tenderness, no hernia. LLQ/inguinal bruise is resolving. Extremities: no Pedal edema, no calf tenderness Neurology: alert and oriented to time, place, and person affect appropriate.  Filed Weights   08/01/22 0559 08/02/22 0500 08/03/22 0500  Weight: 103.1 kg 103.1 kg 103.1 kg   Vitals:   08/03/22 0500 08/03/22 0806  BP: (!) 141/77 (!) 155/82  Pulse: 80 72  Resp: 18 17  Temp: 97.8 F (36.6 C) 97.9 F (36.6 C)  SpO2: 100% 99%    DISCHARGE MEDICATION: Allergies as of 08/03/2022       Reactions   Penicillins Rash        Medication List     STOP taking these medications    cephALEXin 500 MG capsule Commonly known as: KEFLEX       TAKE these medications    Accu-Chek Guide test strip Generic drug: glucose blood Use to check blood sugars 4 times a day   E11.65   Accu-Chek Softclix Lancets lancets Use to check blood sugars 4 times a day   E11.65   acetaminophen 325 MG tablet Commonly known as: TYLENOL Take 2 tablets (650 mg total) by mouth every 6 (six) hours as needed for mild pain or moderate pain (or Fever >/= 101).   apixaban 5 MG Tabs tablet Commonly known as: ELIQUIS Take 1 tablet (5 mg total) by mouth 2 (two) times daily.  Must make appt with Dr. Rockey Situ for refills   ascorbic acid 500 MG tablet Commonly known as: VITAMIN C Take 1 tablet (500 mg total) by mouth daily. Start taking on: August 04, 2022   Breztri Aerosphere 160-9-4.8 MCG/ACT Aero Generic drug: Budeson-Glycopyrrol-Formoterol Inhale 2 puffs into the lungs 2 (two) times daily. What changed:  when to take this reasons to take this   carvedilol 12.5 MG tablet Commonly known as: Coreg Take 1 tablet (12.5 mg total) by mouth 2 (two) times daily.   cyanocobalamin 1000 MCG tablet Take 1 tablet (1,000 mcg total) by mouth daily.   doxycycline 100 MG capsule Commonly known as: MONODOX Take 1 capsule (100 mg total) by mouth 2 (two) times daily for 5 days.   folic acid 1 MG  tablet Commonly known as: FOLVITE Take 1 tablet (1 mg total) by mouth daily. Start taking on: August 04, 2022   furosemide 40 MG tablet Commonly known as: LASIX Take 1 tablet (40 mg total) by mouth daily. Take lasix 66m po daily   glimepiride 2 MG tablet Commonly known as: AMARYL Take 1 tablet (2 mg total) by mouth daily.   hydrALAZINE 100 MG tablet Commonly known as: APRESOLINE Take 1 tablet (100 mg total) by mouth 3 (three) times daily.   ipratropium-albuterol 0.5-2.5 (3) MG/3ML Soln Commonly known as: DUONEB Take 3 mLs by nebulization every 6 (six) hours as needed.   iron polysaccharides 150 MG capsule Commonly known as: NIFEREX Take 1 capsule (150 mg total) by mouth daily.   methocarbamol 500 MG tablet Commonly known as: ROBAXIN Take 1 tablet (500 mg total) by mouth every 6 (six) hours as needed for muscle spasms.   mupirocin ointment 2 % Commonly known as: BACTROBAN Apply 1 Application topically daily. To wound on elbow until healed.   Novofine Pen Needle 32G X 6 MM Misc Generic drug: Insulin Pen Needle Use as directed with toujeo pen   omeprazole 40 MG capsule Commonly known as: PRILOSEC Take 1 capsule (40 mg total) by mouth daily. Take 1 capsule once in the morning and once in the evening What changed:  how much to take how to take this when to take this   potassium chloride SA 20 MEQ tablet Commonly known as: KLOR-CON M Take 20 mEq by mouth daily.   rosuvastatin 10 MG tablet Commonly known as: CRESTOR Take 1 tablet (10 mg total) by mouth at bedtime.   Toujeo Max SoloStar 300 UNIT/ML Solostar Pen Generic drug: insulin glargine (2 Unit Dial) Inject 20 units in am   True Metrix Air Glucose Meter Devi 1 Device by Does not apply route 2 (two) times daily.   Vitamin D (Ergocalciferol) 1.25 MG (50000 UNIT) Caps capsule Commonly known as: DRISDOL Take 1 capsule (50,000 Units total) by mouth every 7 (seven) days.               Discharge Care  Instructions  (From admission, onward)           Start     Ordered   08/03/22 0000  Discharge wound care:       Comments: As mentioned above   08/03/22 1051           Allergies  Allergen Reactions   Penicillins Rash   Discharge Instructions     Call MD for:  difficulty breathing, headache or visual disturbances   Complete by: As directed    Call MD for:  extreme fatigue   Complete by: As directed  Call MD for:  persistant dizziness or light-headedness   Complete by: As directed    Call MD for:  persistant nausea and vomiting   Complete by: As directed    Call MD for:  severe uncontrolled pain   Complete by: As directed    Call MD for:  temperature >100.4   Complete by: As directed    Diet - low sodium heart healthy   Complete by: As directed    Discharge instructions   Complete by: As directed    Follow-up with PCP in 1 week, continue wound care.  Repeat CBC and BMP after 1 week Follow-up with GI as an outpatient if recurrent bleeding. Follow with a urologist for right renal mass workup, will need MRI as an outpatient.   Discharge wound care:   Complete by: As directed    As mentioned above   Increase activity slowly   Complete by: As directed        The results of significant diagnostics from this hospitalization (including imaging, microbiology, ancillary and laboratory) are listed below for reference.    Significant Diagnostic Studies: NM Bone Scan Whole Body  Result Date: 08/01/2022 CLINICAL DATA:  Further evaluation of T12 vertebral body lesion. Patient reports fall from bed with left-sided hip pain. EXAM: NUCLEAR MEDICINE WHOLE BODY BONE SCAN TECHNIQUE: Whole body anterior and posterior images were obtained approximately 3 hours after intravenous injection of radiopharmaceutical. RADIOPHARMACEUTICALS:  21.42 mCi Technetium-70mMDP IV COMPARISON:  CT chest dated 08/01/2022 FINDINGS: No focal increased uptake at the level of T12 to correspond to finding  on prior chest CT. Multilevel uptake involving the posterior costovertebral junctions, likely degenerative. Focal uptake involving the left greater trochanter may correspond to reported posttraumatic injury. Multifocal areas of periarticular uptake, for example involving the bilateral shoulders, knees, ankles, and left great toe are in a pattern typical for arthropathy. IMPRESSION: 1. No focal radiotracer uptake at the level of T12 to correspond to finding on prior chest CT. 2. Focal uptake involving the left greater trochanter, likely posttraumatic. Electronically Signed   By: LDarrin NipperM.D.   On: 08/01/2022 15:27   CT CHEST WO CONTRAST  Result Date: 08/01/2022 CLINICAL DATA:  Occult malignancy. EXAM: CT CHEST WITHOUT CONTRAST TECHNIQUE: Multidetector CT imaging of the chest was performed following the standard protocol without IV contrast. RADIATION DOSE REDUCTION: This exam was performed according to the departmental dose-optimization program which includes automated exposure control, adjustment of the mA and/or kV according to patient size and/or use of iterative reconstruction technique. COMPARISON:  Chest radiography yesterday. Previous chest CT 05/16/2022. FINDINGS: Cardiovascular: Mild cardiac enlargement. Coronary artery calcification and aortic atherosclerotic calcification are present. Mediastinum/Nodes: No mediastinal or hilar mass or adenopathy visible on this study done without contrast. Lungs/Pleura: There are small bilateral pleural effusions layering dependently. There is patchy density in the posteroinferior lower lobes that could be atelectasis subsequent to the fusions or could indicate mild basilar pneumonia. Atelectasis is favored. Findings are slightly more pronounced on the left than the right. No evidence bullous emphysema. No mass or nodule to indicate primary or metastatic disease. Upper Abdomen: See results abdominal CT yesterday. Musculoskeletal: Chronic thoracic fusion in the mid  and lower thoracic region. Indistinct sclerotic change within the T12 vertebral body, progressive since studies as distant as 2020. No sign of lytic destructive change or extraosseous soft tissue tumor. The overall findings look indolent/benign, but progressive change over the last 4 years does raise some concern. If the patient has a  malignancy that could result in sclerotic bone metastases, that possibility should be considered. IMPRESSION: 1. Small bilateral pleural effusions layering dependently. Patchy density in the posteroinferior lower lobes that could be atelectasis subsequent to the effusions or could indicate mild basilar pneumonia. Findings are slightly more pronounced on the left than the right. 2. Mild cardiac enlargement. Coronary artery calcification and aortic atherosclerotic calcification. 3. Indistinct sclerotic change within the T12 vertebral body, progressive since studies as distant as 2020. No sign of lytic destructive change or extraosseous soft tissue tumor. The overall findings look indolent/benign, but progressive change over the last 4 years does raise some concern. If the patient has a malignancy that could result in sclerotic bone metastases, that possibility should be considered. 4. Aortic atherosclerosis. 5. Emphysema. Aortic Atherosclerosis (ICD10-I70.0) and Emphysema (ICD10-J43.9). Electronically Signed   By: Nelson Chimes M.D.   On: 08/01/2022 13:24   CT ABDOMEN PELVIS WO CONTRAST  Result Date: 07/31/2022 CLINICAL DATA:  Left flank bruising. Evaluate for bleed. Patient also complains of vomiting this morning. EXAM: CT ABDOMEN AND PELVIS WITHOUT CONTRAST TECHNIQUE: Multidetector CT imaging of the abdomen and pelvis was performed following the standard protocol without IV contrast. RADIATION DOSE REDUCTION: This exam was performed according to the departmental dose-optimization program which includes automated exposure control, adjustment of the mA and/or kV according to patient  size and/or use of iterative reconstruction technique. COMPARISON:  Diffuse severe parenchymal volume loss and mild fatty infiltration. The main pancreatic duct is not dilated. No peripancreatic fat stranding or fluid collection. FINDINGS: Lower chest: Trace pleural fluid bilaterally with adjacent dependent atelectasis. Calcified granuloma in the right middle lobe. Hypodense blood pool, suggestive of anemia. Hepatobiliary: No focal liver abnormality is seen. Status post cholecystectomy. No biliary dilatation. Pancreas: Diffuse severe parenchymal atrophy and mild fatty infiltration. The main pancreatic duct is not dilated. No peripancreatic fat stranding or fluid collection. Spleen: Normal in size without focal abnormality. Multiple calcified granulomas scattered throughout the spleen. Adrenals/Urinary Tract: Left adrenal mass measures 8.6 x 5.6 cm, previously 8.3 x 6.0 cm on 02/22/2022 (series 2, image 25). Coarse calcification again noted along the edge of the mass. Right adrenal gland is unremarkable. Postoperative changes of left nephrectomy. Heterogeneous mass at the inferior pole of the right kidney, best visualized on sagittal and coronal series measures approximately 6.2 x 4.9 cm, not significantly changed compared to 02/22/2022 when measured in a similar fashion but increased in size compared to 12/10/2019 when it measured approximately 5.2 x 3.7 cm (series 6, image 58). No right hydronephrosis or nephrolithiasis. Several fluid density benign cortical cysts again noted in the right kidney. Mildly distended urinary bladder is unremarkable. Stomach/Bowel: Stomach is within normal limits. Appendix appears normal. Diverticulosis of the descending and sigmoid colon. No evidence of bowel wall thickening, distention, or inflammatory changes. Vascular/Lymphatic: Aortic atherosclerosis. No enlarged abdominal or pelvic lymph nodes. Reproductive: Prostate is unremarkable. Other: Fat containing 7.8 cm right umbilical  hernia (series 2, image 60). A 5.7 cm left lumbar hernia contains a small knuckle of small bowel and fat (series 2, image 36). Mild subcutaneous fat stranding is noted in the region of the left flank. No focal fluid collection. Musculoskeletal: A 2.1 x 2.0 cm sclerotic lesion with ill-defined margins is noted in the posterior aspect of the T12 vertebral body (series 2, image 25). No acute osseous findings. Flowing anterior osteophytes in the visualized distal thoracic spine, consistent with diffuse idiopathic skeletal hyperostosis. Vacuum disc phenomenon at L2-L3 and L4-L5. IMPRESSION: 1. Mild subcutaneous  fat stranding in the left flank without focal fluid collection. No CT findings on this unenhanced exam to suggest active bleed as queried. 2. A 6.2 cm heterogeneous mass at the inferior pole of the right kidney is not significantly changed compared to 02/22/2022 but is increased in size compared to 12/10/2019. This remains concerning for renal cell carcinoma. When the patient is clinically stable and able to follow directions and hold their breath (preferably as an outpatient) further evaluation with dedicated abdominal MRI should be considered. 3. Indeterminate 2.1 cm sclerotic lesion with ill-defined margins is noted in the T12 vertebral body. This is concerning for possible osseous metastatic disease. Recommend nuclear medicine bone scan for further evaluation. 4. Trace pleural fluid bilaterally with adjacent dependent atelectasis. 5. Hypodense blood pool, suggestive of anemia. 6. Other chronic findings as described in the body of the report. 7. Aortic atherosclerosis. Aortic Atherosclerosis (ICD10-I70.0). Electronically Signed   By: Ileana Roup M.D.   On: 07/31/2022 15:21   CT Cervical Spine Wo Contrast  Result Date: 07/31/2022 CLINICAL DATA:  Neck trauma. EXAM: CT CERVICAL SPINE WITHOUT CONTRAST TECHNIQUE: Multidetector CT imaging of the cervical spine was performed without intravenous contrast.  Multiplanar CT image reconstructions were also generated. RADIATION DOSE REDUCTION: This exam was performed according to the departmental dose-optimization program which includes automated exposure control, adjustment of the mA and/or kV according to patient size and/or use of iterative reconstruction technique. COMPARISON:  05/04/2022 FINDINGS: Alignment: Normal. Skull base and vertebrae: No acute fracture. No primary bone lesion or focal pathologic process. Soft tissues and spinal canal: No prevertebral fluid or swelling. No visible canal hematoma. Disc levels: Disc space narrowing with marginal osteophyte formation C2-3 through C5-6. Upper chest: Negative. IMPRESSION: Degenerative changes.  No acute traumatic abnormalities. Electronically Signed   By: Sammie Bench M.D.   On: 07/31/2022 14:51   CT HEAD WO CONTRAST (5MM)  Result Date: 07/31/2022 CLINICAL DATA:  Head trauma. EXAM: CT HEAD WITHOUT CONTRAST TECHNIQUE: Contiguous axial images were obtained from the base of the skull through the vertex without intravenous contrast. RADIATION DOSE REDUCTION: This exam was performed according to the departmental dose-optimization program which includes automated exposure control, adjustment of the mA and/or kV according to patient size and/or use of iterative reconstruction technique. COMPARISON:  05/16/2022 FINDINGS: Brain: There is periventricular white matter decreased attenuation consistent with small vessel ischemic changes. Gray-white differentiation is preserved. No acute intracranial hemorrhage, mass effect or shift. No hydrocephalus. Vascular: No hyperdense vessel or unexpected calcification. Skull: Normal. Negative for fracture or focal lesion. Sinuses/Orbits: Mucoperiosteal thickening consistent with chronic maxillary and ethmoid sinusitis. IMPRESSION: Periventricular white matter changes consistent with chronic small vessel ischemia. No acute intracranial process identified. Electronically Signed   By:  Sammie Bench M.D.   On: 07/31/2022 14:48   DG Chest 2 View  Result Date: 07/31/2022 CLINICAL DATA:  Shortness of breath.  Wound to right elbow. EXAM: CHEST - 2 VIEW COMPARISON:  07/21/2022 FINDINGS: Cardiac enlargement. Aortic atherosclerosis. Pulmonary vascular congestion. Blunting of the costophrenic angle suggests small pleural effusions. No airspace opacities identified. Degenerative disc disease noted within the thoracic spine. IMPRESSION: Cardiac enlargement and pulmonary vascular congestion. Small bilateral pleural effusions suspected. Electronically Signed   By: Kerby Moors M.D.   On: 07/31/2022 13:07   DG Chest 2 View  Result Date: 07/21/2022 CLINICAL DATA:  Chest pain EXAM: CHEST - 2 VIEW COMPARISON:  07/14/2022 FINDINGS: Normal heart size, mediastinal contours, and pulmonary vascularity. Lungs clear. No pulmonary infiltrate or pneumothorax.  Tiny LEFT pleural effusion blunts LEFT costophrenic Rotated to the RIGHT. No acute osseous findings. IMPRESSION: No acute abnormalities. Electronically Signed   By: Lavonia Dana M.D.   On: 07/21/2022 13:21   DG Chest 2 View  Result Date: 07/14/2022 CLINICAL DATA:  Short of breath EXAM: CHEST - 2 VIEW COMPARISON:  05/16/2022 FINDINGS: Frontal and lateral views of the chest demonstrate an unremarkable cardiac silhouette. No airspace disease, effusion, or pneumothorax. No acute bony abnormality. IMPRESSION: 1. No acute intrathoracic process. Electronically Signed   By: Randa Ngo M.D.   On: 07/14/2022 21:21   DG Knee Complete 4 Views Right  Result Date: 07/14/2022 CLINICAL DATA:  Knee pain EXAM: RIGHT KNEE - COMPLETE 4+ VIEW COMPARISON:  Radiographs 10/15/2020 FINDINGS: No acute fracture or dislocation. Tricompartmental degenerative arthritis right knee with moderate medial compartment narrowing. Small knee joint effusion. IMPRESSION: No acute fracture or dislocation. Electronically Signed   By: Placido Sou M.D.   On: 07/14/2022 20:10     Microbiology: Recent Results (from the past 240 hour(s))  Resp panel by RT-PCR (RSV, Flu A&B, Covid) Anterior Nasal Swab     Status: None   Collection Time: 07/31/22 12:24 PM   Specimen: Anterior Nasal Swab  Result Value Ref Range Status   SARS Coronavirus 2 by RT PCR NEGATIVE NEGATIVE Final    Comment: (NOTE) SARS-CoV-2 target nucleic acids are NOT DETECTED.  The SARS-CoV-2 RNA is generally detectable in upper respiratory specimens during the acute phase of infection. The lowest concentration of SARS-CoV-2 viral copies this assay can detect is 138 copies/mL. A negative result does not preclude SARS-Cov-2 infection and should not be used as the sole basis for treatment or other patient management decisions. A negative result may occur with  improper specimen collection/handling, submission of specimen other than nasopharyngeal swab, presence of viral mutation(s) within the areas targeted by this assay, and inadequate number of viral copies(<138 copies/mL). A negative result must be combined with clinical observations, patient history, and epidemiological information. The expected result is Negative.  Fact Sheet for Patients:  EntrepreneurPulse.com.au  Fact Sheet for Healthcare Providers:  IncredibleEmployment.be  This test is no t yet approved or cleared by the [place] FDA and  has been authorized for detection and/or diagnosis of SARS-CoV-2 by FDA under an Emergency Use Authorization (EUA). This EUA will remain  in effect (meaning this test can be used) for the duration of the COVID-19 declaration under Section 564(b)(1) of the Act, 21 U.S.C.section 360bbb-3(b)(1), unless the authorization is terminated  or revoked sooner.       Influenza A by PCR NEGATIVE NEGATIVE Final   Influenza B by PCR NEGATIVE NEGATIVE Final    Comment: (NOTE) The Xpert Xpress SARS-CoV-2/FLU/RSV plus assay is intended as an aid in the diagnosis of  influenza from Nasopharyngeal swab specimens and should not be used as a sole basis for treatment. Nasal washings and aspirates are unacceptable for Xpert Xpress SARS-CoV-2/FLU/RSV testing.  Fact Sheet for Patients: EntrepreneurPulse.com.au  Fact Sheet for Healthcare Providers: IncredibleEmployment.be  This test is not yet approved or cleared by the [place] FDA and has been authorized for detection and/or diagnosis of SARS-CoV-2 by FDA under an Emergency Use Authorization (EUA). This EUA will remain in effect (meaning this test can be used) for the duration of the COVID-19 declaration under Section 564(b)(1) of the Act, 21 U.S.C. section 360bbb-3(b)(1), unless the authorization is terminated or revoked.     Resp Syncytial Virus by PCR NEGATIVE NEGATIVE Final  Comment: (NOTE) Fact Sheet for Patients: EntrepreneurPulse.com.au  Fact Sheet for Healthcare Providers: IncredibleEmployment.be  This test is not yet approved or cleared by the [place] FDA and has been authorized for detection and/or diagnosis of SARS-CoV-2 by FDA under an Emergency Use Authorization (EUA). This EUA will remain in effect (meaning this test can be used) for the duration of the COVID-19 declaration under Section 564(b)(1) of the Act, 21 U.S.C. section 360bbb-3(b)(1), unless the authorization is terminated or revoked.  Performed at [facility], [address] [place], [place] [address]      Labs: CBC: Recent Labs  Lab 07/31/22 1117 07/31/22 1904 08/01/22 0426 08/02/22 0332 08/03/22 0532  WBC 3.4* 3.9* 3.3* 3.0* 3.9*  NEUTROABS 2.3  --   --   --   --   HGB 8.3* 8.6* 8.1* 8.1* 8.5*  HCT 26.4* 27.6* 26.2* 25.8* 27.3*  MCV 86.8 86.0 85.9 85.4 86.7  PLT 183 196 189 175 [id number]   Basic Metabolic Panel: Recent Labs  Lab 07/31/22 1117 08/01/22 0426 08/02/22 0332 08/03/22 0532  NA 139 136 134* 137  K  4.4 4.0 3.9 4.1  CL 113* 104 106 107  CO2 21* 23 23 22  $ GLUCOSE 130* 106* 99 153*  BUN 52* 49* 41* 43*  CREATININE 2.51* 2.46* 2.20* 2.26*  CALCIUM 8.4* 8.4* 8.0* 8.2*  MG  --   --  1.7 1.8  PHOS  --   --  4.2 3.7   Liver Function Tests: Recent Labs  Lab 07/31/22 1117  AST 29  ALT 18  ALKPHOS 61  BILITOT 1.0  PROT 6.7  ALBUMIN 2.6*   No results for input(s): "LIPASE", "AMYLASE" in the last 168 hours. No results for input(s): "AMMONIA" in the last 168 hours. Cardiac Enzymes: No results for input(s): "CKTOTAL", "CKMB", "CKMBINDEX", "TROPONINI" in the last 168 hours. BNP (last 3 results) Recent Labs    05/16/22 1404 07/14/22 2138 07/31/22 1117  BNP 77.7 87.4 230.0*   CBG: Recent Labs  Lab 08/02/22 1126 08/02/22 1618 08/02/22 2127 08/03/22 0806 08/03/22 1137  GLUCAP 106* 199* 159* 150* 157*    Time spent: 35 minutes  Signed:  Val Riles  [facility]  08/03/2022 12:54 PM

## 2022-08-05 ENCOUNTER — Telehealth: Payer: Self-pay | Admitting: Nurse Practitioner

## 2022-08-05 NOTE — Telephone Encounter (Signed)
Lvm to schedule hospital follow up-[name] ?

## 2022-08-06 ENCOUNTER — Encounter: Payer: Self-pay | Admitting: Nurse Practitioner

## 2022-08-09 ENCOUNTER — Telehealth: Payer: Self-pay | Admitting: Nurse Practitioner

## 2022-08-09 NOTE — Telephone Encounter (Signed)
Lvm to schedule hospital follow up-[name] ?

## 2022-08-15 ENCOUNTER — Encounter: Payer: Medicare PPO | Attending: Internal Medicine | Admitting: Internal Medicine

## 2022-08-15 ENCOUNTER — Other Ambulatory Visit: Payer: Self-pay | Admitting: Nurse Practitioner

## 2022-08-15 DIAGNOSIS — L0889 Other specified local infections of the skin and subcutaneous tissue: Secondary | ICD-10-CM | POA: Diagnosis not present

## 2022-08-15 DIAGNOSIS — Z760 Encounter for issue of repeat prescription: Secondary | ICD-10-CM

## 2022-08-15 DIAGNOSIS — E1122 Type 2 diabetes mellitus with diabetic chronic kidney disease: Secondary | ICD-10-CM

## 2022-08-15 DIAGNOSIS — L0231 Cutaneous abscess of buttock: Secondary | ICD-10-CM | POA: Diagnosis not present

## 2022-08-15 DIAGNOSIS — L89012 Pressure ulcer of right elbow, stage 2: Secondary | ICD-10-CM | POA: Diagnosis not present

## 2022-08-15 MED ORDER — NOVOFINE PEN NEEDLE 32G X 6 MM MISC: 5 refills | Status: DC

## 2022-08-15 NOTE — Progress Notes (Signed)
Pt on blood thinner with fall-Ct head order to rule out ICH

## 2022-08-16 DIAGNOSIS — S51001A Unspecified open wound of right elbow, initial encounter: Secondary | ICD-10-CM | POA: Diagnosis not present

## 2022-08-17 ENCOUNTER — Other Ambulatory Visit: Payer: Self-pay | Admitting: Cardiovascular Disease

## 2022-08-17 DIAGNOSIS — I483 Typical atrial flutter: Secondary | ICD-10-CM

## 2022-08-17 NOTE — Telephone Encounter (Signed)
Refill request

## 2022-08-18 NOTE — Telephone Encounter (Signed)
Prescription refill request for Eliquis received. Indication: a flutter Last office visit: 05/23/22 Scr: 2.26 08/06/22 epic Age: 67 Weight: 103kg

## 2022-08-19 NOTE — Progress Notes (Signed)
WYNDELL, LEPINE ([id number]) [id number] Page 1 of 5 Visit Report for 08/15/2022 Abuse Risk Screen Details Patient Name: Date of Service: Larry Callahan MES F. 08/15/2022 1:30 PM Medical Record Number: [id number] Patient Account Number: [id number] Date of Birth/Sex: Treating RN: 25-Jan-1956 (67 y.o. Larry Callahan) Carlene Coria Primary Care Athanasius Kesling: [name] Other Clinician: Referring Jaeleigh Monaco: Treating Shabnam Ladd/Extender: RO BSO N, [place], Alyssa Weeks in Treatment: 0 Abuse Risk Screen Items Answer ABUSE RISK SCREEN: Has anyone close to you tried to hurt or harm you recentlyo No Do you feel uncomfortable with anyone in your familyo No Has anyone forced you do things that you didnt want to doo No Electronic Signature(s) Signed: 08/17/2022 3:12:30 PM By: Carlene Coria RN Entered By: Carlene Coria on 08/15/2022 13:50:43 -------------------------------------------------------------------------------- Activities of Daily Living Details Patient Name: Date of Service: Larry Callahan MES F. 08/15/2022 1:30 PM Medical Record Number: [id number] Patient Account Number: [id number] Date of Birth/Sex: Treating RN: 1956-03-16 (67 y.o. Larry Callahan) Carlene Coria Primary Care Eesha Schmaltz: [name] Other Clinician: Referring Zachory Mangual: Treating Cay Kath/Extender: RO BSO N, MICHA EL G Abernathy, Alyssa Weeks in Treatment: 0 Activities of Daily Living Items Answer Activities of Daily Living (Please select one for each item) Drive Automobile Not Able T Medications ake Completely Able Use T elephone Completely Able Care for Appearance Completely Able Use T oilet Completely Able Bath / Shower Completely Able Dress Self Completely Able Feed Self Completely Able Walk Completely Able Get In / Out Bed Completely Able Housework Completely CHRISTOPHERMICH, SENDEJO ([id number]) [phone] [id number] Page 2 of 5 Prepare Meals Completely Able Handle Money  Completely Able Shop for Self Completely Able Electronic Signature(s) Signed: 08/17/2022 3:12:30 PM By: Carlene Coria RN Entered By: Carlene Coria on 08/15/2022 13:51:20 -------------------------------------------------------------------------------- Education Screening Details Patient Name: Date of Service: Larry Callahan MES F. 08/15/2022 1:30 PM Medical Record Number: [id number] Patient Account Number: [id number] Date of Birth/Sex: Treating RN: 22-Feb-1956 (67 y.o. Larry Callahan) Carlene Coria Primary Care Jaydeen Odor: [name] Other Clinician: Referring Lana Flaim: Treating Sissy Goetzke/Extender: RO BSO N, MICHA EL [place]on [place], Alyssa Weeks in Treatment: 0 Primary Learner Assessed: Patient Learning Preferences/Education Level/Primary Language Learning Preference: Explanation Highest Education Level: Grade School Preferred Language: English Cognitive Barrier Language Barrier: No Translator Needed: No Memory Deficit: No Emotional Barrier: No Cultural/Religious Beliefs Affecting Medical Care: No Physical Barrier Impaired Vision: No Impaired Hearing: No Decreased Hand dexterity: No Knowledge/Comprehension Knowledge Level: Low Comprehension Level: Low Ability to understand written instructions: Low Ability to understand verbal instructions: Low Motivation Anxiety Level: Anxious Cooperation: Cooperative Education Importance: Acknowledges Need Interest in Health Problems: Asks Questions Perception: Coherent Willingness to Engage in Self-Management High Activities: Readiness to Engage in Self-Management High Activities: Electronic Signature(s) Signed: 08/17/2022 3:12:30 PM By: Carlene Coria RN Entered By: Carlene Coria on 08/15/2022 13:51:54 Chase Caller ([id number]) [phone] [email or web address] Page 3 of 5 -------------------------------------------------------------------------------- Fall Risk Assessment Details Patient Name: Date of Service: Larry Callahan MES F. 08/15/2022  1:30 PM Medical Record Number: [id number] Patient Account Number: [id number] Date of Birth/Sex: Treating RN: Apr 05, 1956 (67 y.o. Larry Callahan) Carlene Coria Primary Care Tasheema Perrone: [name] Other Clinician: Referring Jenette Rayson: Treating Connie Lasater/Extender: RO BSO N, MICHA EL [place], Alyssa Weeks in Treatment: 0 Fall Risk Assessment Items Have you had 2 or more falls in the last 12 monthso 0 No Have you had any fall that resulted in injury in the last 12 monthso 0 No FALLS RISK SCREEN History of falling - immediate or within 3 months 0 No Secondary  diagnosis (Do you have 2 or more medical diagnoseso) 0 No Ambulatory aid None/bed rest/wheelchair/nurse 0 Yes Crutches/cane/walker 0 No Furniture 0 No Intravenous therapy Access/Saline/Heparin Lock 0 No Gait/Transferring Normal/ bed rest/ wheelchair 0 Yes Weak (short steps with or without shuffle, stooped but able to lift head while walking, may seek 0 No support from furniture) Impaired (short steps with shuffle, may have difficulty arising from chair, head down, impaired 0 No balance) Mental Status Oriented to own ability 0 Yes Electronic Signature(s) Signed: 08/17/2022 3:12:30 PM By: Carlene Coria RN Entered By: Carlene Coria on 08/15/2022 13:52:07 -------------------------------------------------------------------------------- Foot Assessment Details Patient Name: Date of Service: Larry Callahan MES F. 08/15/2022 1:30 PM Medical Record Number: [id number] Patient Account Number: [id number] Date of Birth/Sex: Treating RN: [birth year] (67 y.o. Larry Callahan) Carlene Coria Primary Care Kimber Fritts: [name] Other Clinician: Referring Imberly Troxler: Treating Justan Gaede/Extender: RO BSO N, MICHA EL [place], Alyssa Weeks in Treatment: 0 Foot Assessment Items Site Locations MOSHE, TURCHETTA F ([id number]) [phone] [id number] Page 4 of 5 + = Sensation present, - = Sensation absent, C = Callus, U = Ulcer R = Redness, W = Warmth, M =  Maceration, PU = Pre-ulcerative lesion F = Fissure, S = Swelling, D = Dryness Assessment Right: Left: Other Deformity: No No Prior Foot Ulcer: No No Prior Amputation: No No Charcot Joint: No No Ambulatory Status: Ambulatory Without Help Gait: Steady Electronic Signature(s) Signed: 08/17/2022 3:12:30 PM By: Carlene Coria RN Entered By: Carlene Coria on 08/15/2022 13:52:36 -------------------------------------------------------------------------------- Nutrition Risk Screening Details Patient Name: Date of Service: Larry Callahan MES F. 08/15/2022 1:30 PM Medical Record Number: [id number] Patient Account Number: [id number] Date of Birth/Sex: Treating RN: 16-Dec-1955 (67 y.o. Larry Callahan) Carlene Coria Primary Care Michel Eskelson: [name] Other Clinician: Referring Koreena Joost: Treating Symia Herdt/Extender: RO BSO N, MICHA EL G Abernathy, Alyssa Weeks in Treatment: 0 Height (in): 71 Weight (lbs): 229 Body Mass Index (BMI): 31.9 Nutrition Risk Screening Items Score Screening NUTRITION RISK SCREEN: I have an illness or condition that made me change the kind and/or amount of food I eat 0 No I eat fewer than two meals per day 0 No I eat few fruits and vegetables, or milk products 0 No I have three or more drinks of beer, liquor or wine almost every day 0 No I have tooth or mouth problems that make it hard for me to eat 0 No I don't always have enough money to buy the food I need 0 No STEAVE, LATRONICA ([id number]) [phone] [id number] Page 5 of 5 I eat alone most of the time 0 No I take three or more different prescribed or over-the-counter drugs a day 1 Yes Without wanting to, I have lost or gained 10 pounds in the last six months 0 No I am not always physically able to shop, cook and/or feed myself 0 No Nutrition Protocols Good Risk Protocol 0 No interventions needed Moderate Risk Protocol High Risk Proctocol Risk Level: Good Risk Score: 1 Electronic Signature(s) Signed:  08/17/2022 3:12:30 PM By: Carlene Coria RN Entered By: Carlene Coria on 08/15/2022 13:52:27

## 2022-08-19 NOTE — Progress Notes (Signed)
Larry Callahan ([id number]) [id number] Page 1 of 9 Visit Report for 08/15/2022 Allergy List Details Patient Name: Date of Service: Larry Callahan. 08/15/2022 1:30 PM Medical Record Number: [id number] Patient Account Number: [id number] Date of Birth/Sex: Treating RN: February 19, 1956 (67 y.o. Larry Callahan) Larry Callahan Primary Care Larry Callahan: Larry Callahan Other Clinician: Referring Larry Callahan: Treating Larry Callahan/Extender: Larry Callahan, Larry Callahan, Larry Callahan in Treatment: 0 Allergies Active Allergies penicillin Allergy Notes Electronic Signature(s) Signed: 08/17/2022 3:12:30 PM By: Larry Coria RN Entered By: Larry Callahan on 08/15/2022 13:47:20 -------------------------------------------------------------------------------- Arrival Information Details Patient Name: Date of Service: Larry Callahan. 08/15/2022 1:30 PM Medical Record Number: [id number] Patient Account Number: [id number] Date of Birth/Sex: Treating RN: 1956-05-17 (67 y.o. Larry Callahan) Larry Callahan Primary Care Larry Callahan: Larry Callahan Other Clinician: Referring Larry Callahan: Treating Larry Callahan/Extender: Larry Callahan, Larry Callahan, Larry Callahan in Treatment: 0 Visit Information Patient Arrived: Ambulatory Arrival Time: 13:33 Accompanied By: self Transfer Assistance: Manual Patient Identification Verified: Yes Secondary Verification Process Completed: Yes Patient Requires Transmission-Based Precautions: No Patient Has Alerts: Yes Patient Alerts: Patient on Blood Thinner Electronic Signature(s) Signed: 08/17/2022 3:12:30 PM By: Larry Coria RN Entered By: Larry Callahan on 08/15/2022 13:43:56 Larry Callahan ([id number]) [id number] Page 2 of 9 -------------------------------------------------------------------------------- Clinic Level of Care Assessment Details Patient Name: Date of Service: Larry Callahan. 08/15/2022 1:30 PM Medical Record Number: [id number] Patient Account  Number: [id number] Date of Birth/Sex: Treating RN: [birth year] (67 y.o. Larry Callahan) Larry Callahan Primary Care Melba Araki: Larry Callahan Other Clinician: Referring Camesha Farooq: Treating Kareema Keitt/Extender: Larry Callahan, Larry Callahan, Larry Callahan in Treatment: 0 Clinic Level of Care Assessment Items TOOL 2 Quantity Score X- 1 0 Use when only an EandM is performed on the INITIAL visit ASSESSMENTS - Nursing Assessment / Reassessment X- 1 20 General Physical Exam (combine w/ comprehensive assessment (listed just below) when performed on new pt. evals) X- 1 25 Comprehensive Assessment (HX, ROS, Risk Assessments, Wounds Hx, etc.) ASSESSMENTS - Wound and Skin A ssessment / Reassessment X - Simple Wound Assessment / Reassessment - one wound 1 5 '[]'$  - 0 Complex Wound Assessment / Reassessment - multiple wounds '[]'$  - 0 Dermatologic / Skin Assessment (not related to wound area) ASSESSMENTS - Ostomy and/or Continence Assessment and Care '[]'$  - 0 Incontinence Assessment and Management '[]'$  - 0 Ostomy Care Assessment and Management (repouching, etc.) PROCESS - Coordination of Care X - Simple Patient / Family Education for ongoing care 1 15 '[]'$  - 0 Complex (extensive) Patient / Family Education for ongoing care '[]'$  - 0 Staff obtains Programmer, systems, Records, T Results / Process Orders est '[]'$  - 0 Staff telephones HHA, Nursing Homes / Clarify orders / etc '[]'$  - 0 Routine Transfer to another Facility (non-emergent condition) '[]'$  - 0 Routine Hospital Admission (non-emergent condition) '[]'$  - 0 New Admissions / Biomedical engineer / Ordering NPWT Apligraf, etc. , '[]'$  - 0 Emergency Hospital Admission (emergent condition) X- 1 10 Simple Discharge Coordination '[]'$  - 0 Complex (extensive) Discharge Coordination PROCESS - Special Needs '[]'$  - 0 Pediatric / Minor Patient Management '[]'$  - 0 Isolation Patient Management '[]'$  - 0 Hearing / Language / Visual special needs '[]'$  - 0 Assessment of Community assistance  (transportation, D/C planning, etc.) '[]'$  - 0 Additional assistance / Altered mentation '[]'$  - 0 Support Surface(s) Assessment (bed, cushion, seat, etc.) INTERVENTIONS - Wound Cleansing / Measurement Larry Callahan, Larry Callahan ([id number]) [id number] Page 3 of 9 X- 1 5 Wound Imaging (photographs - any number  of wounds) '[]'$  - 0 Wound Tracing (instead of photographs) X- 1 5 Simple Wound Measurement - one wound '[]'$  - 0 Complex Wound Measurement - multiple wounds X- 1 5 Simple Wound Cleansing - one wound '[]'$  - 0 Complex Wound Cleansing - multiple wounds INTERVENTIONS - Wound Dressings X - Small Wound Dressing one or multiple wounds 1 10 '[]'$  - 0 Medium Wound Dressing one or multiple wounds '[]'$  - 0 Large Wound Dressing one or multiple wounds '[]'$  - 0 Application of Medications - injection INTERVENTIONS - Miscellaneous '[]'$  - 0 External ear exam '[]'$  - 0 Specimen Collection (cultures, biopsies, blood, body fluids, etc.) '[]'$  - 0 Specimen(s) / Culture(s) sent or taken to Lab for analysis '[]'$  - 0 Patient Transfer (multiple staff / Civil Service fast streamer / Similar devices) '[]'$  - 0 Simple Staple / Suture removal (25 or less) '[]'$  - 0 Complex Staple / Suture removal (26 or more) '[]'$  - 0 Hypo / Hyperglycemic Management (close monitor of Blood Glucose) '[]'$  - 0 Ankle / Brachial Index (ABI) - do not check if billed separately Has the patient been seen at the hospital within the last three years: Yes Total Score: 100 Level Of Care: New/Established - Level 3 Electronic Signature(s) Signed: 08/17/2022 3:12:30 PM By: Larry Coria RN Entered By: Larry Callahan on 08/15/2022 14:22:14 -------------------------------------------------------------------------------- Encounter Discharge Information Details Patient Name: Date of Service: Larry Callahan. 08/15/2022 1:30 PM Medical Record Number: [id number] Patient Account Number: [id number] Date of Birth/Sex: Treating RN: 1955/07/01 (67 y.o. Larry Callahan) Larry Callahan Primary Care Mako Pelfrey: Larry Callahan Other Clinician: Referring Arneisha Kincannon: Treating Jillienne Egner/Extender: Larry Callahan, Larry Callahan, Larry Callahan in Treatment: 0 Encounter Discharge Information Items Post Procedure Vitals Discharge Condition: Stable Temperature (Callahan): 97.7 Ambulatory Status: Ambulatory Pulse (bpm): 70 Discharge Destination: Home Respiratory Rate (breaths/min): 18 Transportation: Private Auto Blood Pressure (mmHg): 148/91 Accompanied By: self Schedule Follow-up Appointment: Yes Clinical Summary of Care: Larry Callahan, Larry Callahan ([id number]) [id number] Page 4 of 9 Electronic Signature(s) Signed: 08/17/2022 3:12:30 PM By: Larry Coria RN Entered By: Larry Callahan on 08/15/2022 14:23:22 -------------------------------------------------------------------------------- Lower Extremity Assessment Details Patient Name: Date of Service: Larry Callahan. 08/15/2022 1:30 PM Medical Record Number: [id number] Patient Account Number: [id number] Date of Birth/Sex: Treating RN: 06/09/1956 (67 y.o. Larry Callahan) Larry Callahan Primary Care Opha Mcghee: Larry Callahan Other Clinician: Referring Gamaliel Charney: Treating Kallyn Demarcus/Extender: Larry Callahan, Larry Callahan, Larry Callahan in Treatment: 0 Electronic Signature(s) Signed: 08/17/2022 3:12:30 PM By: Larry Coria RN Entered By: Larry Callahan on 08/15/2022 13:52:49 -------------------------------------------------------------------------------- Multi Wound Chart Details Patient Name: Date of Service: Larry Callahan. 08/15/2022 1:30 PM Medical Record Number: [id number] Patient Account Number: [id number] Date of Birth/Sex: Treating RN: [birth year] (67 y.o. Larry Callahan) Larry Callahan Primary Care Donnette Macmullen: Larry Callahan Other Clinician: Referring Dhyan Noah: Treating Veatrice Eckstein/Extender: Larry Callahan, Larry Callahan, Larry Callahan in Treatment: 0 Vital Signs Height(in): 71 Pulse(bpm): 70 Weight(lbs): 229 Blood  Pressure(mmHg): 148/91 Body Mass Index(BMI): 31.9 Temperature(Callahan): 97.7 Respiratory Rate(breaths/min): 18 [1:Photos:] [Callahan/A:Callahan/A] Right Elbow Callahan/A Callahan/A Wound Location: Gradually Appeared Callahan/A Callahan/A Wounding Event: NAVIAN, AGUILA ([id number]) [id number] Page 5 of 9 Inflammatory Callahan/A Callahan/A Primary Etiology: Chronic Obstructive Pulmonary Callahan/A Callahan/A Comorbid History: Disease (COPD), Arrhythmia, Coronary Artery Disease, Hypertension, Type II Diabetes 08/12/2022 Callahan/A Callahan/A Date Acquired: 0 Callahan/A Callahan/A Callahan of Treatment: Open Callahan/A Callahan/A Wound Status: No Callahan/A Callahan/A Wound Recurrence: 0.4x0.4x0.2 Callahan/A Callahan/A Measurements L x W x D (cm) 0.126 Callahan/A Callahan/A A (cm) : rea 0.025 Callahan/A Callahan/A  Volume (cm) : Full Thickness Without Exposed Callahan/A Callahan/A Classification: Support Structures Medium Callahan/A Callahan/A Exudate A mount: Serosanguineous Callahan/A Callahan/A Exudate Type: red, brown Callahan/A Callahan/A Exudate Color: Large (67-100%) Callahan/A Callahan/A Granulation A mount: Red Callahan/A Callahan/A Granulation Quality: None Present (0%) Callahan/A Callahan/A Necrotic A mount: Fat Layer (Subcutaneous Tissue): Yes Callahan/A Callahan/A Exposed Structures: Fascia: No Tendon: No Muscle: No Joint: No Bone: No None Callahan/A Callahan/A Epithelialization: Chemical/Enzymatic/Mechanical Callahan/A Callahan/A Debridement: Other(saline gauze) Callahan/A Callahan/A Instrument: Minimum Callahan/A Callahan/A Bleeding: Pressure Callahan/A Callahan/A Hemostasis A chieved: 0 Callahan/A Callahan/A Procedural Pain: 0 Callahan/A Callahan/A Post Procedural Pain: Debridement Treatment Response: Procedure was tolerated well Callahan/A Callahan/A Post Debridement Measurements L x 0.4x0.4x0.2 Callahan/A Callahan/A W x D (cm) 0.025 Callahan/A Callahan/A Post Debridement Volume: (cm) Debridement Callahan/A Callahan/A Procedures Performed: Treatment Notes Wound #1 (Elbow) Wound Laterality: Right Cleanser Byram Ancillary Kit - 15 Day Supply Discharge Instruction: Use supplies as instructed; Kit contains: (15) Saline Bullets; (15) 3x3 Gauze; 15 pr Gloves Soap and Water Discharge Instruction: Gently cleanse wound with antibacterial  soap, rinse and pat dry prior to dressing wounds Peri-Wound Care Topical Primary Dressing Hydrofera Blue Ready Transfer Foam, 2.5x2.5 (in/in) Discharge Instruction: Apply Hydrofera Blue Ready to wound bed as directed Secondary Dressing (BORDER) Zetuvit Plus SILICONE BORDER Dressing 5x5 (in/in) Discharge Instruction: Please do not put silicone bordered dressings under wraps. Use non-bordered dressing only. Secured With Compression Wrap Compression Stockings Environmental education officer) Signed: 08/15/2022 4:38:26 PM By: Linton Ham MD Entered By: Linton Ham on 08/15/2022 14:25:48 Larry Callahan ([id number]) [id number] Page 6 of 9 -------------------------------------------------------------------------------- Multi-Disciplinary Care Plan Details Patient Name: Date of Service: Larry Callahan. 08/15/2022 1:30 PM Medical Record Number: [id number] Patient Account Number: [id number] Date of Birth/Sex: Treating RN: 06-11-1956 (67 y.o. Larry Callahan) Larry Callahan Primary Care Aunesty Tyson: Larry Callahan Other Clinician: Referring Kyron Schlitt: Treating Vannie Hochstetler/Extender: Larry Callahan, Larry Callahan, Larry Callahan in Treatment: 0 Active Inactive Wound/Skin Impairment Nursing Diagnoses: Knowledge deficit related to ulceration/compromised skin integrity Goals: Patient/caregiver will verbalize understanding of skin care regimen Date Initiated: 08/15/2022 Target Resolution Date: 09/15/2022 Goal Status: Active Ulcer/skin breakdown will have a volume reduction of 30% by week 4 Date Initiated: 08/15/2022 Target Resolution Date: 09/15/2022 Goal Status: Active Ulcer/skin breakdown will have a volume reduction of 50% by week 8 Date Initiated: 08/15/2022 Target Resolution Date: 10/15/2022 Goal Status: Active Ulcer/skin breakdown will have a volume reduction of 80% by week 12 Date Initiated: 08/15/2022 Target Resolution Date: 11/15/2022 Goal Status: Active Ulcer/skin breakdown  will heal within 14 Callahan Date Initiated: 08/15/2022 Target Resolution Date: 12/15/2022 Goal Status: Active Interventions: Assess patient/caregiver ability to obtain necessary supplies Assess patient/caregiver ability to perform ulcer/skin care regimen upon admission and as needed Assess ulceration(s) every visit Notes: Electronic Signature(s) Signed: 08/17/2022 3:12:30 PM By: Larry Coria RN Entered By: Larry Callahan on 08/15/2022 13:56:25 -------------------------------------------------------------------------------- Pain Assessment Details Patient Name: Date of Service: Larry Callahan. 08/15/2022 1:30 PM Medical Record Number: [id number] Patient Account Number: [id number] Larry Callahan, Larry Callahan ([id number]) [phone].pdf Page 7 of 9 Date of Birth/Sex: Treating RN: [birth year] (67 y.o. Larry Callahan) Larry Callahan Primary Care Estee Yohe: Other Clinician: [name] Referring Morgane Joerger: Treating Kristee Angus/Extender: Larry Callahan, Larry Callahan, Larry Callahan in Treatment: 0 Active Problems Location of Pain Severity and Description of Pain Patient Has Paino No Site Locations Pain Management and Medication Current Pain Management: Electronic Signature(s) Signed: 08/17/2022 3:12:30 PM By: Larry Coria RN Entered By: Larry Callahan on 08/15/2022 13:45:54 -------------------------------------------------------------------------------- Patient/Caregiver Education Details  Patient Name: Date of Service: Larry Callahan. 3/4/2024andnbsp1:30 PM Medical Record Number: [id number] Patient Account Number: [id number] Date of Birth/Gender: Treating RN: [birth year] (67 y.o. Larry Callahan) Larry Callahan Primary Care Physician: Larry Callahan Other Clinician: Referring Physician: Treating Physician/Extender: Larry Callahan, [place], Larry Callahan in Treatment: 0 Education Assessment Education Provided To: Patient Education Topics Provided Welcome T [facility]  Packet: o Methods: Explain/Verbal Responses: State content correctly Electronic Signature(s) Signed: 08/17/2022 3:12:30 PM By: Larry Coria RN Larry Callahan ([id number]) [id number] Page 8 of 9 Entered By: Larry Callahan on 08/15/2022 13:56:36 -------------------------------------------------------------------------------- Wound Assessment Details Patient Name: Date of Service: Larry Callahan. 08/15/2022 1:30 PM Medical Record Number: [id number] Patient Account Number: [id number] Date of Birth/Sex: Treating RN: 10-Aug-1955 (67 y.o. Larry Callahan) Larry Callahan Primary Care Kenyotta Dorfman: Larry Callahan Other Clinician: Referring Carmeron Heady: Treating Shamera Yarberry/Extender: Larry Callahan, Larry Callahan, Larry Callahan in Treatment: 0 Wound Status Wound Number: 1 Primary Inflammatory Etiology: Wound Location: Right Elbow Wound Open Wounding Event: Gradually Appeared Status: Date Acquired: 08/12/2022 Comorbid Chronic Obstructive Pulmonary Disease (COPD), Arrhythmia, Callahan Of Treatment: 0 History: Coronary Artery Disease, Hypertension, Type II Diabetes Clustered Wound: No Photos Wound Measurements Length: (cm) 0.4 Width: (cm) 0.4 Depth: (cm) 0.2 Area: (cm) 0.126 Volume: (cm) 0.025 % Reduction in Area: % Reduction in Volume: Epithelialization: None Tunneling: No Undermining: No Wound Description Classification: Full Thickness Without Exposed Suppor Exudate Amount: Medium Exudate Type: Serosanguineous Exudate Color: red, brown t Structures Foul Odor After Cleansing: No Slough/Fibrino No Wound Bed Granulation Amount: Large (67-100%) Exposed Structure Granulation Quality: Red Fascia Exposed: No Necrotic Amount: None Present (0%) Fat Layer (Subcutaneous Tissue) Exposed: Yes Tendon Exposed: No Muscle Exposed: No Joint Exposed: No Bone Exposed: No Treatment Notes Wound #1 (Elbow) Wound Laterality: Right Cleanser Larry Callahan, Larry Callahan ([id number])  [id number] Page 9 of 9 Byram Ancillary Kit - 15 Day Supply Discharge Instruction: Use supplies as instructed; Kit contains: (15) Saline Bullets; (15) 3x3 Gauze; 15 pr Gloves Soap and Water Discharge Instruction: Gently cleanse wound with antibacterial soap, rinse and pat dry prior to dressing wounds Peri-Wound Care Topical Primary Dressing Hydrofera Blue Ready Transfer Foam, 2.5x2.5 (in/in) Discharge Instruction: Apply Hydrofera Blue Ready to wound bed as directed Secondary Dressing (BORDER) Zetuvit Plus SILICONE BORDER Dressing 5x5 (in/in) Discharge Instruction: Please do not put silicone bordered dressings under wraps. Use non-bordered dressing only. Secured With Compression Wrap Compression Stockings Environmental education officer) Signed: 08/17/2022 3:12:30 PM By: Larry Coria RN Entered By: Larry Callahan on 08/15/2022 13:54:39 -------------------------------------------------------------------------------- Vitals Details Patient Name: Date of Service: Larry Callahan, Larry Callahan. 08/15/2022 1:30 PM Medical Record Number: [id number] Patient Account Number: [id number] Date of Birth/Sex: Treating RN: April 13, 1956 (67 y.o. Larry Callahan) Larry Callahan Primary Care Falicity Sheets: Larry Callahan Other Clinician: Referring Rockey Guarino: Treating Aquarius Latouche/Extender: Larry Callahan, Larry Callahan, Larry Callahan in Treatment: 0 Vital Signs Time Taken: 13:45 Temperature (Callahan): 97.7 Height (in): 71 Pulse (bpm): 70 Source: Stated Respiratory Rate (breaths/min): 18 Weight (lbs): 229 Blood Pressure (mmHg): 148/91 Source: Stated Reference Range: 80 - 120 mg / dl Body Mass Index (BMI): 31.9 Electronic Signature(s) Signed: 08/17/2022 3:12:30 PM By: Larry Coria RN Entered By: Larry Callahan on 08/15/2022 13:46:39

## 2022-08-19 NOTE — Progress Notes (Signed)
KHIZAR, SMITHER ([id number]) [id number] Page 1 of 7 Visit Report for 08/15/2022 Chief Complaint Document Details Patient Name: Date of Service: Larry Callahan MES F. 08/15/2022 1:30 PM Medical Record Number: [id number] Patient Account Number: [id number] Date of Birth/Sex: Treating RN: May 16, 1956 (67 y.o. Larry Callahan) Larry Callahan Primary Care Provider: [name] Other Clinician: Referring Provider: Treating Provider/Extender: RO BSO N, [place], Larry Callahan in Treatment: 0 Information Obtained from: Patient Chief Complaint 08/15/22 patient comes to clinic today for review of the wound on the right elbow Electronic Signature(s) Signed: 08/15/2022 4:38:26 PM By: Larry Ham MD Entered By: Larry Callahan on 08/15/2022 14:26:55 -------------------------------------------------------------------------------- Debridement Details Patient Name: Date of Service: Larry Callahan MES F. 08/15/2022 1:30 PM Medical Record Number: [id number] Patient Account Number: [id number] Date of Birth/Sex: Treating RN: 07-Sep-1955 (67 y.o. Larry Callahan) Larry Callahan Primary Care Provider: [name] Other Clinician: Referring Provider: Treating Provider/Extender: RO BSO N, Larry Callahan, Larry Callahan in Treatment: 0 Debridement Performed for Assessment: Wound #1 Right Elbow Performed By: Physician Larry Dillon, MD Debridement Type: Chemical/Enzymatic/Mechanical Agent Used: saline gauze Level of Consciousness (Pre-procedure): Awake and Alert Pre-procedure Verification/Time Out No Taken: Instrument: Other : saline gauze Bleeding: Minimum Hemostasis Achieved: Pressure End Time: 14:20 Procedural Pain: 0 Post Procedural Pain: 0 Response to Treatment: Procedure was tolerated well Level of Consciousness (Post- Awake and Alert procedure): Post Debridement Measurements of Total Wound Larry Callahan, Larry Callahan ([id number]) [id number] Page 2 of  7 Length: (cm) 0.4 Width: (cm) 0.4 Depth: (cm) 0.2 Volume: (cm) 0.025 Character of Wound/Ulcer Post Debridement: Improved Post Procedure Diagnosis Same as Pre-procedure Electronic Signature(s) Signed: 08/15/2022 4:38:26 PM By: Larry Ham MD Signed: 08/17/2022 3:12:30 PM By: Larry Coria RN Entered By: Larry Callahan on 08/15/2022 14:20:35 -------------------------------------------------------------------------------- HPI Details Patient Name: Date of Service: Larry Callahan, Larry Callahan MES F. 08/15/2022 1:30 PM Medical Record Number: [id number] Patient Account Number: [id number] Date of Birth/Sex: Treating RN: [birth year] (67 y.o. Larry Callahan) Larry Callahan Primary Care Provider: [name] Other Clinician: Referring Provider: Treating Provider/Extender: RO BSO N, Larry Callahan, Larry Callahan in Treatment: 0 History of Present Illness HPI Description: ADMISSION 08/15/2022 This is a 67 year old man who came in with very little medical information. There was some suggestion that he was in the hospital last month with cellulitis of the arm I will need to check this out thoroughly. He tells me that he received 2 courses of antibiotics last month were not really sure which ones they were but they are completed now. He is not really having any discomfort. His sister who lives nearby is dressing this with what sounds like Coban. I looked through [facility] health link. He was in the hospital about 3 Callahan ago for an infection of the right elbow. Prior to that he had been in an urgent care receiving Keflex. I do not think he was admitted to hospital but may have spent some time in the ER. I do not see any cultures. Electronic Signature(s) Signed: 08/16/2022 3:41:05 PM By: Larry Ham MD Previous Signature: 08/15/2022 4:38:26 PM Version By: Larry Ham MD Entered By: Larry Callahan on 08/16/2022 14:47:23 -------------------------------------------------------------------------------- Physical Exam  Details Patient Name: Date of Service: Larry Callahan MES F. 08/15/2022 1:30 PM Medical Record Number: [id number] Patient Account Number: [id number] Date of Birth/Sex: Treating RN: 02/15/1956 (67 y.o. Larry Callahan Primary Care Provider: [name] Other Clinician: Referring Provider: Treating Provider/Extender: [address] Larry Callahan, Larry Callahan [place] F ([id number]) [id number] Page 3 of  7 Callahan in Treatment: 0 Constitutional Patient is hypertensive.. Pulse regular and within target range for patient.Marland Kitchen Respirations regular, non-labored and within target range.. Temperature is normal and within the target range for the patient.Marland Kitchen appears in no distress. Respiratory Respiratory effort is easy and symmetric bilaterally. Rate is normal at rest and on room air.. Cardiovascular Heart rhythm and rate regular, without murmur or gallop.. Notes Wound exam; right elbow. Small clean surface of the wound over the tip of the olecranon. There is no surrounding infection. There is no probable depth. Electronic Signature(s) Signed: 08/16/2022 3:41:05 PM By: Larry Ham MD Previous Signature: 08/15/2022 4:38:26 PM Version By: Larry Ham MD Entered By: Larry Callahan on 08/16/2022 14:47:35 -------------------------------------------------------------------------------- Physician Orders Details Patient Name: Date of Service: Larry Callahan MES F. 08/15/2022 1:30 PM Medical Record Number: [id number] Patient Account Number: [id number] Date of Birth/Sex: Treating RN: 05-Oct-1955 (67 y.o. Larry Callahan) Larry Callahan Primary Care Provider: [name] Other Clinician: Referring Provider: Treating Provider/Extender: RO BSO N, Larry Callahan, Larry Callahan in Treatment: 0 Verbal / Phone Orders: No Diagnosis Coding Follow-up Appointments Return Appointment in 1 week. Bathing/ Shower/ Hygiene May shower; gently cleanse wound with antibacterial soap, rinse and pat  dry prior to dressing wounds Anesthetic (Use 'Patient Medications' Section for Anesthetic Order Entry) Lidocaine applied to wound bed Wound Treatment Wound #1 - Elbow Wound Laterality: Right Cleanser: Byram Ancillary Kit - 15 Day Supply (DME) (Generic) 3 x Per Week/30 Days Discharge Instructions: Use supplies as instructed; Kit contains: (15) Saline Bullets; (15) 3x3 Gauze; 15 pr Gloves Cleanser: Soap and Water 3 x Per Week/30 Days Discharge Instructions: Gently cleanse wound with antibacterial soap, rinse and pat dry prior to dressing wounds Prim Dressing: Hydrofera Blue Ready Transfer Foam, 2.5x2.5 (in/in) (Generic) 3 x Per Week/30 Days ary Discharge Instructions: Apply Hydrofera Blue Ready to wound bed as directed Secondary Dressing: (BORDER) Zetuvit Plus SILICONE BORDER Dressing 5x5 (in/in) (DME) (Generic) 3 x Per Week/30 Days Discharge Instructions: Please do not put silicone bordered dressings under wraps. Use non-bordered dressing only. Electronic Signature(s) Signed: 08/15/2022 4:38:26 PM By: Larry Ham MD Signed: 08/17/2022 3:12:30 PM By: Larry Coria RN Larry Callahan ([id number]) [id number] Page 4 of 7 Entered By: Larry Callahan on 08/15/2022 14:21:47 -------------------------------------------------------------------------------- Problem List Details Patient Name: Date of Service: Larry Callahan MES F. 08/15/2022 1:30 PM Medical Record Number: [id number] Patient Account Number: [id number] Date of Birth/Sex: Treating RN: 01-04-1956 (67 y.o. Larry Callahan) Larry Callahan Primary Care Provider: [name] Other Clinician: Referring Provider: Treating Provider/Extender: RO BSO N, Larry Callahan, Larry Callahan in Treatment: 0 Active Problems ICD-10 Encounter Code Description Active Date MDM Diagnosis L89.012 Pressure ulcer of right elbow, stage 2 08/15/2022 No Yes Inactive Problems Resolved Problems Electronic Signature(s) Signed: 08/15/2022 4:38:26 PM  By: Larry Ham MD Entered By: Larry Callahan on 08/15/2022 14:25:41 -------------------------------------------------------------------------------- Progress Note Details Patient Name: Date of Service: Larry Callahan MES F. 08/15/2022 1:30 PM Medical Record Number: [id number] Patient Account Number: [id number] Date of Birth/Sex: Treating RN: 11-Nov-1955 (67 y.o. Larry Callahan) Larry Callahan Primary Care Provider: [name] Other Clinician: Referring Provider: Treating Provider/Extender: RO BSO N, Larry Callahan, Larry Callahan in Treatment: 0 Subjective Chief Complaint Information obtained from Patient 08/15/22 patient comes to clinic today for review of the wound on the right elbow History of Present Illness (HPI) ADMISSION 08/15/2022 Larry Callahan, Larry Callahan ([id number]) [id number] Page 5 of 7 This is a 67 year old man who came in with very little medical information. There was  some suggestion that he was in the hospital last month with cellulitis of the arm I will need to check this out thoroughly. He tells me that he received 2 courses of antibiotics last month were not really sure which ones they were but they are completed now. He is not really having any discomfort. His sister who lives nearby is dressing this with what sounds like Coban. Patient History Allergies penicillin Social History Former smoker, Marital Status - Single, Alcohol Use - Never, Drug Use - No History, Caffeine Use - Daily. Medical History Respiratory Patient has history of Chronic Obstructive Pulmonary Disease (COPD) Cardiovascular Patient has history of Arrhythmia, Coronary Artery Disease, Hypertension Endocrine Patient has history of Type II Diabetes Patient is treated with Insulin, Oral Agents. Medical A Surgical History Notes nd Genitourinary CKD 3b Objective Constitutional Patient is hypertensive.. Pulse regular and within target range for patient.Marland Kitchen Respirations regular,  non-labored and within target range.. Temperature is normal and within the target range for the patient.Marland Kitchen appears in no distress. Vitals Time Taken: 1:45 PM, Height: 71 in, Source: Stated, Weight: 229 lbs, Source: Stated, BMI: 31.9, Temperature: 97.7 F, Pulse: 70 bpm, Respiratory Rate: 18 breaths/min, Blood Pressure: 148/91 mmHg. Respiratory Respiratory effort is easy and symmetric bilaterally. Rate is normal at rest and on room air.. Cardiovascular Heart rhythm and rate regular, without murmur or gallop.. General Notes: Wound exam; right elbow. Small clean surface of the wound over the tip of the olecranon. There is no surrounding infection. There is no probable depth. Integumentary (Hair, Skin) Wound #1 status is Open. Original cause of wound was Gradually Appeared. The date acquired was: 08/12/2022. The wound is located on the Right Elbow. The wound measures 0.4cm length x 0.4cm width x 0.2cm depth; 0.126cm^2 area and 0.025cm^3 volume. There is Fat Layer (Subcutaneous Tissue) exposed. There is no tunneling or undermining noted. There is a medium amount of serosanguineous drainage noted. There is large (67-100%) red granulation within the wound bed. There is no necrotic tissue within the wound bed. Assessment Active Problems ICD-10 Pressure ulcer of right elbow, stage 2 Procedures Wound #1 Pre-procedure diagnosis of Wound #1 is an Inflammatory located on the Right Elbow . There was a Chemical/Enzymatic/Mechanical debridement performed by Larry Dillon, MD. With the following instrument(s): saline gauze. Other agent used was saline gauze. A Minimum amount of bleeding was controlled with Pressure. The procedure was tolerated well with a pain level of 0 throughout and a pain level of 0 following the procedure. Post Debridement Measurements: 0.4cm length x 0.4cm width x 0.2cm depth; 0.025cm^3 volume. Character of Wound/Ulcer Post Debridement is improved. Post procedure Diagnosis Wound #1:  Same as Pre-Procedure Larry Callahan, Larry Callahan ([id number]) [id number] Page 6 of 7 Plan Follow-up Appointments: Return Appointment in 1 week. Bathing/ Shower/ Hygiene: May shower; gently cleanse wound with antibacterial soap, rinse and pat dry prior to dressing wounds Anesthetic (Use 'Patient Medications' Section for Anesthetic Order Entry): Lidocaine applied to wound bed WOUND #1: - Elbow Wound Laterality: Right Cleanser: Byram Ancillary Kit - 15 Day Supply (DME) (Generic) 3 x Per Week/30 Days Discharge Instructions: Use supplies as instructed; Kit contains: (15) Saline Bullets; (15) 3x3 Gauze; 15 pr Gloves Cleanser: Soap and Water 3 x Per Week/30 Days Discharge Instructions: Gently cleanse wound with antibacterial soap, rinse and pat dry prior to dressing wounds Prim Dressing: Hydrofera Blue Ready Transfer Foam, 2.5x2.5 (in/in) (Generic) 3 x Per Week/30 Days ary Discharge Instructions: Apply Hydrofera Blue Ready to wound bed as directed Secondary Dressing: (  BORDER) Zetuvit Plus SILICONE BORDER Dressing 5x5 (in/in) (DME) (Generic) 3 x Per Week/30 Days Discharge Instructions: Please do not put silicone bordered dressings under wraps. Use non-bordered dressing only. 1. I am not really sure of the history of this wound I will need to back into epic. 2. No current evidence of infection there is no drainage no tenderness no crepitus 3. We are going to use Hydrofera Blue and border foam which his sister will change every day and we will see him back next week 4. There is very little evidence of infection here. This is gratifying to see. Electronic Signature(s) Signed: 08/16/2022 3:41:05 PM By: Larry Ham MD Previous Signature: 08/15/2022 4:38:26 PM Version By: Larry Ham MD Entered By: Larry Callahan on 08/16/2022 14:48:14 -------------------------------------------------------------------------------- ROS/PFSH Details Patient Name: Date of Service: Larry Callahan, Larry Callahan  MES F. 08/15/2022 1:30 PM Medical Record Number: [id number] Patient Account Number: [id number] Date of Birth/Sex: Treating RN: 1956-03-02 (67 y.o. Larry Callahan) Larry Callahan Primary Care Provider: [name] Other Clinician: Referring Provider: Treating Provider/Extender: RO BSO N, Larry EL G [place], Larry Callahan in Treatment: 0 Respiratory Medical History: Positive for: Chronic Obstructive Pulmonary Disease (COPD) Cardiovascular Medical History: Positive for: Arrhythmia; Coronary Artery Disease; Hypertension Endocrine Medical History: Positive for: Type II Diabetes Time with diabetes: 10 Treated with: Insulin, Oral agents Genitourinary Medical HistoryTARIAN, Larry Callahan ([id number]) [id number] Page 7 of 7 Past Medical History Notes: CKD 3b Immunizations Pneumococcal Vaccine: Received Pneumococcal Vaccination: No Implantable Devices None Family and Social History Former smoker; Marital Status - Single; Alcohol Use: Never; Drug Use: No History; Caffeine Use: Daily Electronic Signature(s) Signed: 08/15/2022 4:38:26 PM By: Larry Ham MD Signed: 08/17/2022 3:12:30 PM By: Larry Coria RN Entered By: Larry Callahan on 08/15/2022 13:50:37 -------------------------------------------------------------------------------- SuperBill Details Patient Name: Date of Service: Larry Callahan MES F. 08/15/2022 Medical Record Number: [id number] Patient Account Number: [id number] Date of Birth/Sex: Treating RN: Nov 01, 1955 (67 y.o. Larry Callahan) Larry Callahan Primary Care Provider: [name] Other Clinician: Referring Provider: Treating Provider/Extender: RO BSO N, [place], Larry Callahan in Treatment: 0 Diagnosis Coding ICD-10 Codes Code Description L89.012 Pressure ulcer of right elbow, stage 2 Facility Procedures : CPT4 Code: [id number] Description: [place] [facility] VISIT-LEV 3 EST PT Modifier: Quantity: 1 Physician Procedures : CPT4 Code Description  Modifier [id number] - WC PHYS LEVEL 2 - NEW PT ICD-10 Diagnosis Description L89.012 Pressure ulcer of right elbow, stage 2 Quantity: 1 Electronic Signature(s) Signed: 08/16/2022 3:41:05 PM By: Larry Ham MD Previous Signature: 08/15/2022 4:38:26 PM Version By: Larry Ham MD Entered By: Larry Callahan on 08/16/2022 14:48:28

## 2022-08-22 ENCOUNTER — Encounter (HOSPITAL_BASED_OUTPATIENT_CLINIC_OR_DEPARTMENT_OTHER): Payer: Medicare PPO | Admitting: Internal Medicine

## 2022-08-22 DIAGNOSIS — L0231 Cutaneous abscess of buttock: Secondary | ICD-10-CM | POA: Diagnosis not present

## 2022-08-22 DIAGNOSIS — L89012 Pressure ulcer of right elbow, stage 2: Secondary | ICD-10-CM

## 2022-08-22 NOTE — Progress Notes (Signed)
Larry, Callahan ([id number]) [id number] Page 1 of 8 Visit Report for 08/22/2022 Arrival Information Details Patient Name: Date of Service: Larry Callahan MES F. 08/22/2022 11:15 A M Medical Record Number: [id number] Patient Account Number: [id number] Date of Birth/Sex: Treating RN: [birth year] (67 y.o. Larry Callahan Primary Care Larry Callahan: Larry Callahan Other Clinician: Referring Larry Callahan: Treating Larry Callahan/Extender: Larry Callahan Weeks in Treatment: 1 Visit Information History Since Last Visit Added or deleted any medications: No Patient Arrived: Ambulatory Any new allergies or adverse reactions: No Arrival Time: 11:12 Had a fall or experienced change in No Accompanied By: self activities of daily living that may affect Transfer Assistance: None risk of falls: Patient Identification Verified: Yes Hospitalized since last visit: No Secondary Verification Process Completed: Yes Has Dressing in Place as Prescribed: Yes Patient Requires Transmission-Based Precautions: No Pain Present Now: No Patient Has Alerts: Yes Patient Alerts: Patient on Blood Thinner Electronic Signature(s) Signed: 08/22/2022 3:25:33 PM By: Larry Callahan Entered By: Larry Callahan on 08/22/2022 11:15:58 -------------------------------------------------------------------------------- Clinic Level of Care Assessment Details Patient Name: Date of Service: Larry Callahan MES F. 08/22/2022 11:15 A M Medical Record Number: [id number] Patient Account Number: [id number] Date of Birth/Sex: Treating RN: Oct 22, 1955 (67 y.o. Larry Callahan Primary Care Larry Callahan: Larry Callahan Other Clinician: Referring Larry Callahan: Treating Larry Callahan/Extender: Larry Callahan Weeks in Treatment: 1 Clinic Level of Care Assessment Items TOOL 4 Quantity Score '[]'$  - 0 Use when only an EandM is performed on FOLLOW-UP visit ASSESSMENTS - Nursing Assessment /  Reassessment X- 1 10 Reassessment of Co-morbidities (includes updates in patient status) X- 1 5 Reassessment of Adherence to Treatment Plan ASSESSMENTS - Wound and Skin A ssessment / Reassessment X - Simple Wound Assessment / Reassessment - one wound 1 5 Larry Callahan, Larry Callahan F ([id number]) [id number] Page 2 of 8 '[]'$  - 0 Complex Wound Assessment / Reassessment - multiple wounds '[]'$  - 0 Dermatologic / Skin Assessment (not related to wound area) ASSESSMENTS - Focused Assessment '[]'$  - 0 Circumferential Edema Measurements - multi extremities '[]'$  - 0 Nutritional Assessment / Counseling / Intervention '[]'$  - 0 Lower Extremity Assessment (monofilament, tuning fork, pulses) '[]'$  - 0 Peripheral Arterial Disease Assessment (using hand held doppler) ASSESSMENTS - Ostomy and/or Continence Assessment and Care '[]'$  - 0 Incontinence Assessment and Management '[]'$  - 0 Ostomy Care Assessment and Management (repouching, etc.) PROCESS - Coordination of Care X - Simple Patient / Family Education for ongoing care 1 15 '[]'$  - 0 Complex (extensive) Patient / Family Education for ongoing care '[]'$  - 0 Staff obtains Programmer, systems, Records, T Results / Process Orders est '[]'$  - 0 Staff telephones HHA, Nursing Homes / Clarify orders / etc '[]'$  - 0 Routine Transfer to another Facility (non-emergent condition) '[]'$  - 0 Routine Hospital Admission (non-emergent condition) '[]'$  - 0 New Admissions / Biomedical engineer / Ordering NPWT Apligraf, etc. , '[]'$  - 0 Emergency Hospital Admission (emergent condition) X- 1 10 Simple Discharge Coordination '[]'$  - 0 Complex (extensive) Discharge Coordination PROCESS - Special Needs '[]'$  - 0 Pediatric / Minor Patient Management '[]'$  - 0 Isolation Patient Management '[]'$  - 0 Hearing / Language / Visual special needs '[]'$  - 0 Assessment of Community assistance (transportation, D/C planning, etc.) '[]'$  - 0 Additional assistance / Altered mentation '[]'$  - 0 Support Surface(s)  Assessment (bed, cushion, seat, etc.) INTERVENTIONS - Wound Cleansing / Measurement X - Simple Wound Cleansing - one wound 1 5 '[]'$  - 0 Complex Wound Cleansing - multiple wounds X- 1 5 Wound Imaging (photographs -  any number of wounds) '[]'$  - 0 Wound Tracing (instead of photographs) X- 1 5 Simple Wound Measurement - one wound '[]'$  - 0 Complex Wound Measurement - multiple wounds INTERVENTIONS - Wound Dressings X - Small Wound Dressing one or multiple wounds 1 10 '[]'$  - 0 Medium Wound Dressing one or multiple wounds '[]'$  - 0 Large Wound Dressing one or multiple wounds '[]'$  - 0 Application of Medications - topical '[]'$  - 0 Application of Medications - injection INTERVENTIONS - Miscellaneous '[]'$  - 0 External ear exam '[]'$  - 0 Specimen Collection (cultures, biopsies, blood, body fluids, etc.) '[]'$  - 0 Specimen(s) / Culture(s) sent or taken to Lab for analysis Larry Callahan, Larry Callahan ([id number]) [id number] Page 3 of 8 '[]'$  - 0 Patient Transfer (multiple staff / Civil Service fast streamer / Similar devices) '[]'$  - 0 Simple Staple / Suture removal (25 or less) '[]'$  - 0 Complex Staple / Suture removal (26 or more) '[]'$  - 0 Hypo / Hyperglycemic Management (close monitor of Blood Glucose) '[]'$  - 0 Ankle / Brachial Index (ABI) - do not check if billed separately X- 1 5 Vital Signs Has the patient been seen at the hospital within the last three years: Yes Total Score: 75 Level Of Care: New/Established - Level 2 Electronic Signature(s) Signed: 08/22/2022 3:25:33 PM By: Larry Callahan Entered By: Larry Callahan on 08/22/2022 11:57:19 -------------------------------------------------------------------------------- Encounter Discharge Information Details Patient Name: Date of Service: Larry Callahan MES F. 08/22/2022 11:15 A M Medical Record Number: [id number] Patient Account Number: [id number] Date of Birth/Sex: Treating RN: 1955/12/09 (67 y.o. Larry Callahan Primary Care Atina Feeley: Larry Callahan  Other Clinician: Referring Merial Moritz: Treating Chevez Sambrano/Extender: Larry Callahan Weeks in Treatment: 1 Encounter Discharge Information Items Discharge Condition: Stable Ambulatory Status: Ambulatory Discharge Destination: Home Transportation: Private Auto Accompanied By: self Schedule Follow-up Appointment: No Clinical Summary of Care: Notes patient wound healed, no follow up needed. Patient advised to call clinic with any issues Electronic Signature(s) Signed: 08/22/2022 3:25:33 PM By: Larry Callahan Entered By: Larry Callahan on 08/22/2022 11:58:59 -------------------------------------------------------------------------------- Lower Extremity Assessment Details Patient Name: Date of Service: Larry Callahan MES F. 08/22/2022 11:15 A Larry Callahan, Larry Callahan ([id number]) [phone] Page 4 of 8 Medical Record Number: [id number] Patient Account Number: [id number] Date of Birth/Sex: Treating RN: 1955-12-31 (67 y.o. Larry Callahan Primary Care Kayceon Oki: Larry Callahan Other Clinician: Referring Himani Corona: Treating Szymon Foiles/Extender: Larry Callahan Weeks in Treatment: 1 Electronic Signature(s) Signed: 08/22/2022 3:25:33 PM By: Larry Callahan Entered By: Larry Callahan on 08/22/2022 11:24:51 -------------------------------------------------------------------------------- Multi Wound Chart Details Patient Name: Date of Service: Larry Callahan MES F. 08/22/2022 11:15 A M Medical Record Number: [id number] Patient Account Number: [id number] Date of Birth/Sex: Treating RN: 10/14/1955 (67 y.o. Larry Callahan Primary Care Levante Simones: Larry Callahan Other Clinician: Referring Jullian Clayson: Treating Cathryn Gallery/Extender: Larry Callahan Weeks in Treatment: 1 Vital Signs Height(in): 71 Pulse(bpm): 74 Weight(lbs): 229 Blood Pressure(mmHg): 169/96 Body Mass Index(BMI): 31.9 Temperature(F): 97.6 Respiratory  Rate(breaths/min): 18 [1:Photos:] [N/A:N/A] Right Elbow N/A N/A Wound Location: Gradually Appeared N/A N/A Wounding Event: Inflammatory N/A N/A Primary Etiology: Chronic Obstructive Pulmonary N/A N/A Comorbid History: Disease (COPD), Arrhythmia, Coronary Artery Disease, Hypertension, Type II Diabetes 08/12/2022 N/A N/A Date Acquired: 1 N/A N/A Weeks of Treatment: Open N/A N/A Wound Status: No N/A N/A Wound Recurrence: 0.1x0.1x0.1 N/A N/A Measurements L x W x D (cm) 0.008 N/A N/A A (cm) : rea 0.001 N/A N/A Volume (cm) : 93.70% N/A N/A % Reduction in Area: 96.00% N/A N/A % Reduction in  Volume: Full Thickness Without Exposed N/A N/A Classification: Support Structures None Present N/A N/A Exudate Amount: None Present (0%) N/A N/A Granulation Amount: None Present (0%) N/A N/A Necrotic Amount: Fascia: No N/A N/A Exposed Structures: Fat Layer (Subcutaneous Tissue): No Tendon: No Muscle: No Joint: No Larry Callahan, Larry Callahan ([id number]) [id number] Page 5 of 8 Bone: No Large (67-100%) N/A N/A Epithelialization: Treatment Notes Electronic Signature(s) Signed: 08/22/2022 3:25:33 PM By: Larry Callahan Entered By: Larry Callahan on 08/22/2022 11:24:57 -------------------------------------------------------------------------------- Multi-Disciplinary Care Plan Details Patient Name: Date of Service: Larry Callahan MES F. 08/22/2022 11:15 A M Medical Record Number: [id number] Patient Account Number: [id number] Date of Birth/Sex: Treating RN: Dec 10, 1955 (67 y.o. Larry Callahan Primary Care Garhett Bernhard: Larry Callahan Other Clinician: Referring Tanikka Bresnan: Treating Orel Cooler/Extender: Larry Callahan Weeks in Treatment: 1 Active Inactive Electronic Signature(s) Signed: 08/22/2022 3:25:33 PM By: Larry Callahan Entered By: Larry Callahan on 08/22/2022  11:57:50 -------------------------------------------------------------------------------- Pain Assessment Details Patient Name: Date of Service: Larry Callahan MES F. 08/22/2022 11:15 A M Medical Record Number: [id number] Patient Account Number: [id number] Date of Birth/Sex: Treating RN: [birth year] (67 y.o. Larry Callahan Primary Care Edger Husain: Larry Callahan Other Clinician: Referring Shaletta Hinostroza: Treating Matisyn Cabeza/Extender: Larry Callahan Weeks in Treatment: 1 Active Problems Location of Pain Severity and Description of Pain Patient Has Paino No Site Locations Rate the pain. Larry Callahan, Larry Callahan ([id number]) [id number] Page 6 of 8 Rate the pain. Current Pain Level: 0 Pain Management and Medication Current Pain Management: Electronic Signature(s) Signed: 08/22/2022 3:25:33 PM By: Larry Callahan Entered By: Larry Callahan on 08/22/2022 11:18:21 -------------------------------------------------------------------------------- Patient/Caregiver Education Details Patient Name: Date of Service: Larry Callahan MES F. 3/11/2024andnbsp11:15 [place] Record Number: [id number] Patient Account Number: [id number] Date of Birth/Gender: Treating RN: May 24, 1956 (67 y.o. Larry Callahan Primary Care Physician: Larry Callahan Other Clinician: Referring Physician: Treating Physician/Extender: Larry Callahan Weeks in Treatment: 1 Education Assessment Education Provided To: Patient Education Topics Provided Wound/Skin Impairment: Handouts: Caring for Your Ulcer, Other: c are of healed wound Methods: Explain/Verbal Responses: State content correctly Electronic Signature(s) Signed: 08/22/2022 3:25:33 PM By: Larry Callahan Entered By: Larry Callahan on 08/22/2022 11:58:08 Larry Callahan ([id number]) [id number] Page 7 of  8 -------------------------------------------------------------------------------- Wound Assessment Details Patient Name: Date of Service: Larry Callahan MES F. 08/22/2022 11:15 A M Medical Record Number: [id number] Patient Account Number: [id number] Date of Birth/Sex: Treating RN: 05-18-1956 (67 y.o. Larry Callahan Primary Care Triniti Gruetzmacher: Larry Callahan Other Clinician: Referring Farren Nelles: Treating Ernestine Langworthy/Extender: Larry Callahan Weeks in Treatment: 1 Wound Status Wound Number: 1 Primary Inflammatory Etiology: Wound Location: Right Elbow Wound Healed - Epithelialized Wounding Event: Gradually Appeared Status: Date Acquired: 08/12/2022 Comorbid Chronic Obstructive Pulmonary Disease (COPD), Arrhythmia, Weeks Of Treatment: 1 History: Coronary Artery Disease, Hypertension, Type II Diabetes Clustered Wound: No Photos Wound Measurements Length: (cm) Width: (cm) Depth: (cm) Area: (cm) Volume: (cm) 0 % Reduction in Area: 100% 0 % Reduction in Volume: 100% 0 Epithelialization: Large (67-100%) 0 Tunneling: No 0 Undermining: No Wound Description Classification: Full Thickness Without Exposed Suppor Exudate Amount: None Present t Structures Foul Odor After Cleansing: No Slough/Fibrino No Wound Bed Granulation Amount: None Present (0%) Exposed Structure Necrotic Amount: None Present (0%) Fascia Exposed: No Fat Layer (Subcutaneous Tissue) Exposed: No Tendon Exposed: No Muscle Exposed: No Joint Exposed: No Bone Exposed: No Electronic Signature(s) Signed: 08/22/2022 3:25:33 PM By: Larry Callahan Entered By: Larry Callahan on 08/22/2022 11:54:57 Larry Callahan ([id number]) [id number] Page 8 of 8 -------------------------------------------------------------------------------- Vitals  Details Patient Name: Date of Service: Larry Callahan MES F. 08/22/2022 11:15 A M Medical Record Number: [id number] Patient Account Number:  [id number] Date of Birth/Sex: Treating RN: 1956-02-09 (67 y.o. Larry Callahan Primary Care Atziry Baranski: Larry Callahan Other Clinician: Referring Feras Gardella: Treating Isamar Nazir/Extender: Larry Callahan Weeks in Treatment: 1 Vital Signs Time Taken: 11:16 Temperature (F): 97.6 Height (in): 71 Pulse (bpm): 74 Weight (lbs): 229 Respiratory Rate (breaths/min): 18 Body Mass Index (BMI): 31.9 Blood Pressure (mmHg): 169/96 Reference Range: 80 - 120 mg / dl Electronic Signature(s) Signed: 08/22/2022 3:25:33 PM By: Larry Callahan Entered By: Larry Callahan on 08/22/2022 11:18:09

## 2022-08-22 NOTE — Progress Notes (Signed)
DAXTER, STUBBINS ([id number]) [id number] Page 1 of 6 Visit Report for 08/22/2022 Chief Complaint Document Details Patient Name: Date of Service: Larry Perla MES F. 08/22/2022 11:15 A M Medical Record Number: [id number] Patient Account Number: [id number] Date of Birth/Sex: Treating RN: [birth year] (67 y.o. Larry Callahan Primary Care Provider: [name] Other Clinician: Referring Provider: Treating Provider/Extender: Dorena Cookey Weeks in Treatment: 1 Information Obtained from: Patient Chief Complaint 08/15/22 patient comes to clinic today for review of the wound on the right elbow Electronic Signature(s) Signed: 08/22/2022 1:05:58 PM By: Kalman Shan DO Entered By: Kalman Shan on 08/22/2022 12:50:17 -------------------------------------------------------------------------------- HPI Details Patient Name: Date of Service: Larry Perla MES F. 08/22/2022 11:15 A M Medical Record Number: [id number] Patient Account Number: [id number] Date of Birth/Sex: Treating RN: [birth year] (67 y.o. Larry Callahan Primary Care Provider: [name] Other Clinician: Referring Provider: Treating Provider/Extender: Dorena Cookey Weeks in Treatment: 1 History of Present Illness HPI Description: ADMISSION 08/15/2022 This is a 67 year old man who came in with very little medical information. There was some suggestion that he was in the hospital last month with cellulitis of the arm I will need to check this out thoroughly. He tells me that he received 2 courses of antibiotics last month were not really sure which ones they were but they are completed now. He is not really having any discomfort. His sister who lives nearby is dressing this with what sounds like Coban. I looked through [facility] health link. He was in the hospital about 3 weeks ago for an infection of the right elbow. Prior to that he had been in an urgent  care receiving Keflex. I do not think he was admitted to hospital but may have spent some time in the ER. I do not see any cultures. 3/11; patient presents for follow-up. He has been using Hydrofera Blue to the right elbow. The wound has healed. Electronic Signature(s) Signed: 08/22/2022 1:05:58 PM By: Ventura Sellers ([id number]) [id number] Page 2 of 6 Entered By: Kalman Shan on 08/22/2022 12:52:38 -------------------------------------------------------------------------------- Physical Exam Details Patient Name: Date of Service: Larry Perla MES F. 08/22/2022 11:15 A M Medical Record Number: [id number] Patient Account Number: [id number] Date of Birth/Sex: Treating RN: 1955/07/16 (67 y.o. Larry Callahan Primary Care Provider: [name] Other Clinician: Referring Provider: Treating Provider/Extender: Dorena Cookey Weeks in Treatment: 1 Constitutional . Cardiovascular . Psychiatric . Notes T the right elbow there is epithelization to the previous wound site. o Electronic Signature(s) Signed: 08/22/2022 1:05:58 PM By: Kalman Shan DO Entered By: Kalman Shan on 08/22/2022 12:53:11 -------------------------------------------------------------------------------- Physician Orders Details Patient Name: Date of Service: Larry Perla MES F. 08/22/2022 11:15 A M Medical Record Number: [id number] Patient Account Number: [id number] Date of Birth/Sex: Treating RN: 1955-10-09 (67 y.o. Larry Callahan Primary Care Provider: [name] Other Clinician: Referring Provider: Treating Provider/Extender: Dorena Cookey Weeks in Treatment: 1 Verbal / Phone Orders: No Diagnosis Coding Discharge From [facility] Discharge from [place] Treatment Complete - boarder foam dressing for protection daily. No need to come back unless there is a issue. Electronic  Signature(s) Signed: 08/22/2022 1:05:58 PM By: Kalman Shan DO Entered By: Kalman Shan on 08/22/2022 12:54:10 Chase Caller ([id number]) [id number] Page 3 of 6 -------------------------------------------------------------------------------- Problem List Details Patient Name: Date of Service: Larry Perla MES F. 08/22/2022 11:15 A M Medical Record Number: [id number] Patient Account Number: [id number] Date of Birth/Sex: Treating RN: 1955-12-29 (  67 y.o. Larry Callahan Primary Care Provider: [name] Other Clinician: Referring Provider: Treating Provider/Extender: Dorena Cookey Weeks in Treatment: 1 Active Problems ICD-10 Encounter Code Description Active Date MDM Diagnosis L89.012 Pressure ulcer of right elbow, stage 2 08/15/2022 No Yes Inactive Problems Resolved Problems Electronic Signature(s) Signed: 08/22/2022 1:05:58 PM By: Kalman Shan DO Entered By: Kalman Shan on 08/22/2022 12:50:13 -------------------------------------------------------------------------------- Progress Note Details Patient Name: Date of Service: Larry Perla MES F. 08/22/2022 11:15 A M Medical Record Number: [id number] Patient Account Number: [id number] Date of Birth/Sex: Treating RN: [birth year] (67 y.o. Larry Callahan Primary Care Provider: [name] Other Clinician: Referring Provider: Treating Provider/Extender: Dorena Cookey Weeks in Treatment: 1 Subjective Chief Complaint Information obtained from Patient 08/15/22 patient comes to clinic today for review of the wound on the right elbow History of Present Illness (HPI) ADMISSION 08/15/2022 Callahan, Larry ([id number]) [id number] Page 4 of 6 This is a 67 year old man who came in with very little medical information. There was some suggestion that he was in the hospital last month with cellulitis of the arm I will  need to check this out thoroughly. He tells me that he received 2 courses of antibiotics last month were not really sure which ones they were but they are completed now. He is not really having any discomfort. His sister who lives nearby is dressing this with what sounds like Coban. I looked [facility] health link. He was in the hospital about 3 weeks ago for an infection of the right elbow. Prior to that he had been in an urgent care receiving Keflex. I do not think he was admitted to hospital but may have spent some time in the ER. I do not see any cultures. 3/11; patient presents for follow-up. He has been using Hydrofera Blue to the right elbow. The wound has healed. Objective Constitutional Vitals Time Taken: 11:16 AM, Height: 71 in, Weight: 229 lbs, BMI: 31.9, Temperature: 97.6 F, Pulse: 74 bpm, Respiratory Rate: 18 breaths/min, Blood Pressure: 169/96 mmHg. General Notes: T the right elbow there is epithelization to the previous wound site. o Integumentary (Hair, Skin) Wound #1 status is Healed - Epithelialized. Original cause of wound was Gradually Appeared. The date acquired was: 08/12/2022. The wound has been in treatment 1 weeks. The wound is located on the Right Elbow. The wound measures 0cm length x 0cm width x 0cm depth; 0cm^2 area and 0cm^3 volume. There is no tunneling or undermining noted. There is a none present amount of drainage noted. There is no granulation within the wound bed. There is no necrotic tissue within the wound bed. Assessment Active Problems ICD-10 Pressure ulcer of right elbow, stage 2 Patient has done well with Hydrofera Blue. The wound is healed. He can keep the area protected with a Band-Aid if needed daily for the next couple days. He may follow-up as needed. Plan Discharge From [facility]: Discharge from [place] Treatment Complete - boarder foam dressing for protection daily. No need to come back unless there is a issue. 1. Discharge from  clinic due to closed wound 2. Follow-up as needed Electronic Signature(s) Signed: 08/22/2022 1:05:58 PM By: Kalman Shan DO Entered By: Kalman Shan on 08/22/2022 12:53:54 ROS/PFSH Details -------------------------------------------------------------------------------- Chase Caller ([id number]) [id number] Page 5 of 6 Patient Name: Date of Service: Larry Perla MES F. 08/22/2022 11:15 A M Medical Record Number: [id number] Patient Account Number: [id number] Date of Birth/Sex: Treating RN: 1955-08-11 (67 y.o. Larry Callahan  Primary Care Provider: [name] Other Clinician: Referring Provider: Treating Provider/Extender: Dorena Cookey Weeks in Treatment: 1 Respiratory Medical History: Positive for: Chronic Obstructive Pulmonary Disease (COPD) Cardiovascular Medical History: Positive for: Arrhythmia; Coronary Artery Disease; Hypertension Endocrine Medical History: Positive for: Type II Diabetes Time with diabetes: 10 Treated with: Insulin, Oral agents Genitourinary Medical History: Past Medical History Notes: CKD 3b Immunizations Pneumococcal Vaccine: Received Pneumococcal Vaccination: No Implantable Devices None Family and Social History Former smoker; Marital Status - Single; Alcohol Use: Never; Drug Use: No History; Caffeine Use: Daily Electronic Signature(s) Signed: 08/22/2022 1:05:58 PM By: Kalman Shan DO Signed: 08/22/2022 3:25:33 PM By: Levora Dredge Entered By: Kalman Shan on 08/22/2022 12:54:19 -------------------------------------------------------------------------------- SuperBill Details Patient Name: Date of Service: Lynann Bologna, JA MES F. 08/22/2022 Medical Record Number: [id number] Patient Account Number: [id number] Date of Birth/Sex: Treating RN: Nov 23, 1955 (67 y.o. Larry Callahan Primary Care Provider: [name] Other Clinician: Referring Provider: Treating  Provider/Extender: Dorena Cookey Weeks in Treatment: 1 Diagnosis Coding ICD-10 Codes Code Description L89.012 Pressure ulcer of right elbow, stage 2 KALMAN, LOUALLEN ([id number]) [id number] Page 6 of 6 Facility Procedures : CPT4 Code: [id number] Description: [phone] - WOUND CARE VISIT-LEV 2 EST PT Modifier: Quantity: 1 Physician Procedures : CPT4 Code Description Modifier [id number] - WC PHYS LEVEL 3 - EST PT ICD-10 Diagnosis Description L89.012 Pressure ulcer of right elbow, stage 2 Quantity: 1 Electronic Signature(s) Signed: 08/22/2022 1:05:58 PM By: Kalman Shan DO Entered By: Kalman Shan on 08/22/2022 12:54:05

## 2022-08-31 ENCOUNTER — Emergency Department: Payer: Medicare PPO

## 2022-08-31 ENCOUNTER — Other Ambulatory Visit: Payer: Self-pay

## 2022-08-31 ENCOUNTER — Telehealth: Payer: Self-pay

## 2022-08-31 ENCOUNTER — Emergency Department
Admission: EM | Admit: 2022-08-31 | Discharge: 2022-08-31 | Disposition: A | Discharge status: Home / Self Care | Payer: Medicare PPO | Attending: Emergency Medicine | Admitting: Emergency Medicine

## 2022-08-31 DIAGNOSIS — K625 Hemorrhage of anus and rectum: Secondary | ICD-10-CM | POA: Insufficient documentation

## 2022-08-31 DIAGNOSIS — I2510 Atherosclerotic heart disease of native coronary artery without angina pectoris: Secondary | ICD-10-CM | POA: Insufficient documentation

## 2022-08-31 DIAGNOSIS — E119 Type 2 diabetes mellitus without complications: Secondary | ICD-10-CM | POA: Insufficient documentation

## 2022-08-31 DIAGNOSIS — L0291 Cutaneous abscess, unspecified: Secondary | ICD-10-CM

## 2022-08-31 DIAGNOSIS — R0602 Shortness of breath: Secondary | ICD-10-CM | POA: Insufficient documentation

## 2022-08-31 DIAGNOSIS — R0600 Dyspnea, unspecified: Secondary | ICD-10-CM | POA: Diagnosis not present

## 2022-08-31 DIAGNOSIS — J449 Chronic obstructive pulmonary disease, unspecified: Secondary | ICD-10-CM | POA: Insufficient documentation

## 2022-08-31 DIAGNOSIS — I10 Essential (primary) hypertension: Secondary | ICD-10-CM | POA: Diagnosis not present

## 2022-08-31 DIAGNOSIS — L0231 Cutaneous abscess of buttock: Secondary | ICD-10-CM | POA: Diagnosis not present

## 2022-08-31 DIAGNOSIS — K5900 Constipation, unspecified: Secondary | ICD-10-CM | POA: Diagnosis not present

## 2022-08-31 LAB — CBC WITH DIFFERENTIAL/PLATELET
Abs Immature Granulocytes: 0.02 10*3/uL (ref 0.00–0.07)
Basophils Absolute: 0 10*3/uL (ref 0.0–0.1)
Basophils Relative: 1 %
Eosinophils Absolute: 0 10*3/uL (ref 0.0–0.5)
Eosinophils Relative: 1 %
HCT: 30.9 % — ABNORMAL LOW (ref 39.0–52.0)
Hemoglobin: 9.6 g/dL — ABNORMAL LOW (ref 13.0–17.0)
Immature Granulocytes: 1 %
Lymphocytes Relative: 23 %
Lymphs Abs: 0.8 10*3/uL (ref 0.7–4.0)
MCH: 27.5 pg (ref 26.0–34.0)
MCHC: 31.1 g/dL (ref 30.0–36.0)
MCV: 88.5 fL (ref 80.0–100.0)
Monocytes Absolute: 0.2 10*3/uL (ref 0.1–1.0)
Monocytes Relative: 6 %
Neutro Abs: 2.4 10*3/uL (ref 1.7–7.7)
Neutrophils Relative %: 68 %
Platelets: 183 10*3/uL (ref 150–400)
RBC: 3.49 MIL/uL — ABNORMAL LOW (ref 4.22–5.81)
RDW: 15.6 % — ABNORMAL HIGH (ref 11.5–15.5)
WBC: 3.4 10*3/uL — ABNORMAL LOW (ref 4.0–10.5)
nRBC: 0 % (ref 0.0–0.2)

## 2022-08-31 LAB — COMPREHENSIVE METABOLIC PANEL
ALT: 20 U/L (ref 0–44)
AST: 27 U/L (ref 15–41)
Albumin: 2.7 g/dL — ABNORMAL LOW (ref 3.5–5.0)
Alkaline Phosphatase: 62 U/L (ref 38–126)
Anion gap: 10 (ref 5–15)
BUN: 60 mg/dL — ABNORMAL HIGH (ref 8–23)
CO2: 20 mmol/L — ABNORMAL LOW (ref 22–32)
Calcium: 8.6 mg/dL — ABNORMAL LOW (ref 8.9–10.3)
Chloride: 108 mmol/L (ref 98–111)
Creatinine, Ser: 2.39 mg/dL — ABNORMAL HIGH (ref 0.61–1.24)
GFR, Estimated: 29 mL/min — ABNORMAL LOW (ref >60)
Glucose, Bld: 122 mg/dL — ABNORMAL HIGH (ref 70–99)
Potassium: 4.3 mmol/L (ref 3.5–5.1)
Sodium: 138 mmol/L (ref 135–145)
Total Bilirubin: 0.8 mg/dL (ref 0.3–1.2)
Total Protein: 7.2 g/dL (ref 6.5–8.1)

## 2022-08-31 LAB — BRAIN NATRIURETIC PEPTIDE: B Natriuretic Peptide: 174.4 pg/mL — ABNORMAL HIGH (ref 0.0–100.0)

## 2022-08-31 MED ORDER — DOXYCYCLINE HYCLATE 100 MG PO TABS: 100.0000 mg | ORAL_TABLET | Freq: Two times a day (BID) | ORAL | 0 refills | Status: DC

## 2022-08-31 MED ORDER — DOXYCYCLINE HYCLATE 100 MG PO TABS
100.0000 mg | ORAL_TABLET | Freq: Once | ORAL | Status: AC
  Administered 2022-08-31: 100 mg via ORAL
  Filled 2022-08-31: qty 1

## 2022-08-31 MED ORDER — FUROSEMIDE 40 MG PO TABS: 40.0000 mg | ORAL_TABLET | Freq: Once | ORAL | Status: DC

## 2022-08-31 NOTE — ED Notes (Signed)
Pt informed by charge nurse that this will be the last time he is given a cab voucher due to multiple times he has been given cab vouchers.

## 2022-08-31 NOTE — ED Notes (Signed)
Bloodwork was ordered, changed to level 3.

## 2022-08-31 NOTE — Discharge Instructions (Addendum)
Keep the wound clean, dry, and covered.  Take antibiotic as directed.  Take your home medicines as prescribed.  Follow-up with your primary provider or the heart failure clinic as discussed.

## 2022-08-31 NOTE — ED Notes (Addendum)
Pt asking to leave, but stating has no ride, telling staff that ER should pay taxi for him. Pt is complaining that his foot hurts and that no one put any salve on it. PA is at bedside talking with pt. Pt complaining about area to buttock which is painful. PA examined area. Pt appears to have stage 2 PI to R buttock.

## 2022-08-31 NOTE — ED Notes (Signed)
Provider is ordering PO abx and wound care before pt leaves.

## 2022-08-31 NOTE — ED Notes (Signed)
Pt is asking for food and water. Pt states has been here all day and has not gotten any food.

## 2022-08-31 NOTE — ED Notes (Signed)
Called [employer] and provided information address etc.

## 2022-08-31 NOTE — ED Provider Notes (Signed)
[facility] Provider Note    Event Date/Time   First MD Initiated Contact with Patient 08/31/22 1419     (approximate)   History   Rectal Pain   HPI  Larry Callahan is a 67 y.o. male with history of hypertension, diabetes, CAD, hyperlipidemia, COPD, OSA presents emergency department with complaints of rectal pain and bleeding.  States he is having to strain to use the bathroom.  Noticed blood in his underwear.  Denies fever or chills.  States he is feel like he should stay in the hospital because he cannot breathe well.      Physical Exam   Triage Vital Signs: ED Triage Vitals  Enc Vitals Group     BP 08/31/22 1205 (!) 163/100     Pulse Rate 08/31/22 1205 100     Resp 08/31/22 1205 16     Temp 08/31/22 1205 97.9 F (36.6 C)     Temp src --      SpO2 08/31/22 1205 98 %     Weight --      Height --      Head Circumference --      Peak Flow --      Pain Score 08/31/22 1200 5     Pain Loc --      Pain Edu? --      Excl. in [place]? --     Most recent vital signs: Vitals:   08/31/22 1205 08/31/22 1340  BP: (!) 163/100 (!) 140/80  Pulse: 100   Resp: 16   Temp: 97.9 F (36.6 C)   SpO2: 98%      General: Awake, no distress.   CV:  Good peripheral perfusion. regular rate and  rhythm Resp:  Normal effort. Lungs cta Abd:  No distention.   Other:  No pedal edema noted, rectal exam does not show external hemorrhoids but does show an abscess on the left buttock, draining some blood   ED Results / Procedures / Treatments   Labs (all labs ordered are listed, but only abnormal results are displayed) Labs Reviewed  CBC WITH DIFFERENTIAL/PLATELET - Abnormal; Notable for the following components:      Result Value   WBC 3.4 (*)    RBC 3.49 (*)    Hemoglobin 9.6 (*)    HCT 30.9 (*)    RDW 15.6 (*)    All other components within normal limits  COMPREHENSIVE METABOLIC PANEL - Abnormal; Notable for the following components:   CO2 20 (*)     Glucose, Bld 122 (*)    BUN 60 (*)    Creatinine, Ser 2.39 (*)    Calcium 8.6 (*)    Albumin 2.7 (*)    GFR, Estimated 29 (*)    All other components within normal limits  BRAIN NATRIURETIC PEPTIDE     EKG     RADIOLOGY Chest x-ray, abdomen 1 view    PROCEDURES:   Procedures   MEDICATIONS ORDERED IN ED: Medications - No data to display   IMPRESSION / MDM / [place] / ED COURSE  I reviewed the triage vital signs and the nursing notes.                              Differential diagnosis includes, but is not limited to, hemorrhoids, abscess, CHF, COPD  Patient's presentation is most consistent with acute presentation with potential threat to life or bodily  function.   Labs and imaging ordered.  I feel the patient is stable and most likely will be able to be discharged to home.  Will prescribe antibiotic for the abscess.  Will await labs and imaging prior to making a disposition.   Care transferred to [facility], PA-C  Plan is to discharge with antibiotic if patient's labs are normal.   FINAL CLINICAL IMPRESSION(S) / ED DIAGNOSES   Final diagnoses:  Abscess     Rx / DC Orders   ED Discharge Orders     None        Note:  This document was prepared using Dragon voice recognition software and may include unintentional dictation errors.    Versie Starks, PA-C 08/31/22 1603    Harvest Dark, MD 09/01/22 2133

## 2022-08-31 NOTE — ED Triage Notes (Signed)
Pt comes via [facility] from home with c/o rectal pain and possible hemorrhoids. Pt states is on thinner. VSS

## 2022-08-31 NOTE — ED Provider Notes (Signed)
----------------------------------------- 5:07 PM on 08/31/2022 -----------------------------------------  Blood pressure (!) 140/80, pulse 89, temperature 97.9 F (36.6 C), resp. rate 16, SpO2 98 %.  Assuming care from Dr. Ashok Cordia, PA-C/NP-C.  In short, Larry Callahan is a 67 y.o. male with a chief complaint of Rectal Pain .  Refer to the original H&P for additional details.  The current plan of care is to await patient's pending labs and disposition accordingly.  ____________________________________________    ED Results / Procedures / Treatments   Labs (all labs ordered are listed, but only abnormal results are displayed) Labs Reviewed  CBC WITH DIFFERENTIAL/PLATELET - Abnormal; Notable for the following components:      Result Value   WBC 3.4 (*)    RBC 3.49 (*)    Hemoglobin 9.6 (*)    HCT 30.9 (*)    RDW 15.6 (*)    All other components within normal limits  COMPREHENSIVE METABOLIC PANEL - Abnormal; Notable for the following components:   CO2 20 (*)    Glucose, Bld 122 (*)    BUN 60 (*)    Creatinine, Ser 2.39 (*)    Calcium 8.6 (*)    Albumin 2.7 (*)    GFR, Estimated 29 (*)    All other components within normal limits  BRAIN NATRIURETIC PEPTIDE - Abnormal; Notable for the following components:   B Natriuretic Peptide 174.4 (*)    All other components within normal limits     EKG    RADIOLOGY  I personally viewed and evaluated these images as part of my medical decision making, as well as reviewing the written report by the radiologist.  ED Provider Interpretation: No acute findings  DG Abdomen 1 View  Result Date: 08/31/2022 CLINICAL DATA:  Constipation. EXAM: ABDOMEN - 1 VIEW COMPARISON:  12/06/2012 FINDINGS: The bowel gas pattern is normal. No radio-opaque calculi or other significant radiographic abnormality are seen. Post op changes upper abdomen and RLQ. IMPRESSION: Negative. Electronically Signed   By: Sammie Bench M.D.   On:  08/31/2022 15:17   DG Chest 2 View  Result Date: 08/31/2022 CLINICAL DATA:  dif breathing EXAM: CHEST - 2 VIEW COMPARISON:  07/31/2022. FINDINGS: The heart size and mediastinal contours are within normal limits. Both lungs are clear. No pneumothorax or pleural effusion. There are thoracic degenerative changes. IMPRESSION: No active cardiopulmonary disease. Electronically Signed   By: Sammie Bench M.D.   On: 08/31/2022 15:14     PROCEDURES:  Critical Care performed: No  Procedures   MEDICATIONS ORDERED IN ED: Medications  furosemide (LASIX) tablet 40 mg (has no administration in time range)     IMPRESSION / MDM / ASSESSMENT AND PLAN / ED COURSE  I reviewed the triage vital signs and the nursing notes.                              Differential diagnosis includes, but is not limited to, perirectal abscess, hemorrhoids, anal fissure, cellulitis  Patient's presentation is most consistent with acute complicated illness / injury requiring diagnostic workup.  Patient to the ED for evaluation of rectal/buttocks pain.  Patient was found to have a right buttocks cellulitis with spontaneous purulent drainage.  Patient's diagnosis is consistent with abscess/cellulitis. Patient will be discharged home with prescriptions for see cyclin. Patient is to follow up with his primary provider as needed or otherwise directed. Patient is given ED precautions to return to the ED for any  worsening or new symptoms.  FINAL CLINICAL IMPRESSION(S) / ED DIAGNOSES   Final diagnoses:  Abscess     Rx / DC Orders   ED Discharge Orders          Ordered    doxycycline (VIBRA-TABS) 100 MG tablet  2 times daily        08/31/22 1706             Note:  This document was prepared using Dragon voice recognition software and may include unintentional dictation errors.    Melvenia Needles, PA-C 09/01/22 0039    Rada Hay, MD 09/03/22 [phone]

## 2022-08-31 NOTE — ED Notes (Signed)
Pt to Ed with complaints of rectal soreness and pain and red (unsure if bright, red) blood from "a pimple" to rectal area after straining this AM. LBM last night.

## 2022-08-31 NOTE — Telephone Encounter (Signed)
Pt called that he is bleeding may be hemorrhoids pt is not sure offer him appt he said he cannot come due to he don't have ride and asper alyssa advised him go to ED or need inperson appt in office

## 2022-08-31 NOTE — ED Notes (Signed)
Placed vaseline gauze to pt sound. Pt demanding cab voucher. Called charge nurse who will provide. Pt insists no ride, no money, no one to call. Came EMS.

## 2022-09-01 ENCOUNTER — Encounter: Payer: Medicare PPO | Admitting: Physician Assistant

## 2022-09-01 DIAGNOSIS — L89012 Pressure ulcer of right elbow, stage 2: Secondary | ICD-10-CM | POA: Diagnosis not present

## 2022-09-01 DIAGNOSIS — L0231 Cutaneous abscess of buttock: Secondary | ICD-10-CM | POA: Diagnosis not present

## 2022-09-01 DIAGNOSIS — G4733 Obstructive sleep apnea (adult) (pediatric): Secondary | ICD-10-CM | POA: Diagnosis not present

## 2022-09-02 ENCOUNTER — Telehealth: Payer: Self-pay | Admitting: Cardiovascular Disease

## 2022-09-02 DIAGNOSIS — I2510 Atherosclerotic heart disease of native coronary artery without angina pectoris: Secondary | ICD-10-CM | POA: Diagnosis not present

## 2022-09-02 DIAGNOSIS — I509 Heart failure, unspecified: Secondary | ICD-10-CM | POA: Diagnosis not present

## 2022-09-02 DIAGNOSIS — S51001A Unspecified open wound of right elbow, initial encounter: Secondary | ICD-10-CM | POA: Diagnosis not present

## 2022-09-02 DIAGNOSIS — L0231 Cutaneous abscess of buttock: Secondary | ICD-10-CM | POA: Diagnosis not present

## 2022-09-02 DIAGNOSIS — E785 Hyperlipidemia, unspecified: Secondary | ICD-10-CM | POA: Diagnosis not present

## 2022-09-02 DIAGNOSIS — I110 Hypertensive heart disease with heart failure: Secondary | ICD-10-CM | POA: Diagnosis not present

## 2022-09-02 DIAGNOSIS — J449 Chronic obstructive pulmonary disease, unspecified: Secondary | ICD-10-CM | POA: Diagnosis not present

## 2022-09-02 DIAGNOSIS — S31809A Unspecified open wound of unspecified buttock, initial encounter: Secondary | ICD-10-CM | POA: Diagnosis not present

## 2022-09-02 DIAGNOSIS — E119 Type 2 diabetes mellitus without complications: Secondary | ICD-10-CM | POA: Diagnosis not present

## 2022-09-02 DIAGNOSIS — L89012 Pressure ulcer of right elbow, stage 2: Secondary | ICD-10-CM | POA: Diagnosis not present

## 2022-09-02 DIAGNOSIS — Z602 Problems related to living alone: Secondary | ICD-10-CM | POA: Diagnosis not present

## 2022-09-02 DIAGNOSIS — G4733 Obstructive sleep apnea (adult) (pediatric): Secondary | ICD-10-CM | POA: Diagnosis not present

## 2022-09-02 NOTE — Progress Notes (Signed)
SIMRANJIT, RAMBERG ([id number]) [id number] Page 1 of 7 Visit Report for 09/01/2022 Chief Complaint Document Details Patient Name: Date of Service: Larry Callahan MES F. 09/01/2022 11:00 A M Medical Record Number: [id number] Patient Account Number: [id number] Date of Birth/Sex: Treating RN: [birth year] (67 y.o. Larry Callahan) Carlene Coria Primary Care Provider: [name] Other Clinician: Referring Provider: Treating Provider/Extender: Phillips Climes Weeks in Treatment: 2 Information Obtained from: Patient Chief Complaint Right gluteal abscess Electronic Signature(s) Signed: 09/01/2022 4:31:14 PM By: Worthy Keeler PA-C Previous Signature: 09/01/2022 10:43:30 AM Version By: Worthy Keeler PA-C Entered By: Worthy Keeler on 09/01/2022 16:31:14 -------------------------------------------------------------------------------- Debridement Details Patient Name: Date of Service: Larry Callahan MES F. 09/01/2022 11:00 A M Medical Record Number: [id number] Patient Account Number: [id number] Date of Birth/Sex: Treating RN: 04-21-1956 (67 y.o. Larry Callahan) Carlene Coria Primary Care Provider: [name] Other Clinician: Referring Provider: Treating Provider/Extender: Phillips Climes Weeks in Treatment: 2 Debridement Performed for Assessment: Wound #2 Right Gluteus Performed By: Physician Tommie Sams., PA-C Debridement Type: Debridement Level of Consciousness (Pre-procedure): Awake and Alert Pre-procedure Verification/Time Out Yes - 11:42 Taken: Start Time: 11:42 T Area Debrided (L x W): otal 2.5 (cm) x 3.5 (cm) = 8.75 (cm) Tissue and other material debrided: Viable, Non-Viable, Slough, Subcutaneous, Slough Level: Skin/Subcutaneous Tissue Debridement Description: Excisional Instrument: Curette, Forceps, Scissors Bleeding: Moderate Hemostasis Achieved: Pressure End Time: 11:47 Procedural Pain: 3 Post Procedural Pain: 0 Response to Treatment:  Procedure was tolerated well Larry Callahan, Larry Callahan ([id number]) [id number] Page 2 of 7 Level of Consciousness (Post- Awake and Alert procedure): Post Debridement Measurements of Total Wound Length: (cm) 2.5 Width: (cm) 3.5 Depth: (cm) 0.3 Volume: (cm) 2.062 Character of Wound/Ulcer Post Debridement: Improved Post Procedure Diagnosis Same as Pre-procedure Electronic Signature(s) Signed: 09/01/2022 5:06:45 PM By: Worthy Keeler PA-C Entered By: Carlene Coria on 09/01/2022 11:46:25 -------------------------------------------------------------------------------- HPI Details Patient Name: Date of Service: Larry Callahan MES F. 09/01/2022 11:00 A M Medical Record Number: [id number] Patient Account Number: [id number] Date of Birth/Sex: Treating RN: [birth year] (67 y.o. Larry Callahan Primary Care Provider: [name] Other Clinician: Referring Provider: Treating Provider/Extender: Phillips Climes Weeks in Treatment: 2 History of Present Illness HPI Description: ADMISSION 08/15/2022 This is a 67 year old man who came in with very little medical information. There was some suggestion that he was in the hospital last month with cellulitis of the arm I will need to check this out thoroughly. He tells me that he received 2 courses of antibiotics last month were not really sure which ones they were but they are completed now. He is not really having any discomfort. His sister who lives nearby is dressing this with what sounds like Coban. I looked through [facility] health link. He was in the hospital about 3 weeks ago for an infection of the right elbow. Prior to that he had been in an urgent care receiving Keflex. I do not think he was admitted to hospital but may have spent some time in the ER. I do not see any cultures. 3/11; patient presents for follow-up. He has been using Hydrofera Blue to the right elbow. The wound has healed. Readmission: 09-01-2022 upon  evaluation today patient presents for reevaluation here in the clinic he actually was previously seen for his elbow today he comes in with the elbow okay but unfortunately a right gluteal abscess. Unfortunately he tells me that this is somewhat painful. He was seen in the ER they gave him doxycycline he  has not picked that up yet that was just yesterday. Electronic Signature(s) Signed: 09/01/2022 4:31:31 PM By: Worthy Keeler PA-C Entered By: Worthy Keeler on 09/01/2022 16:31:30 Larry Callahan ([id number]) [id number] Page 3 of 7 -------------------------------------------------------------------------------- Physical Exam Details Patient Name: Date of Service: Larry Callahan MES F. 09/01/2022 11:00 A M Medical Record Number: [id number] Patient Account Number: [id number] Date of Birth/Sex: Treating RN: Dec 17, 1955 (67 y.o. Larry Callahan) Carlene Coria Primary Care Provider: [name] Other Clinician: Referring Provider: Treating Provider/Extender: Phillips Climes Weeks in Treatment: 2 Constitutional Obese and well-hydrated in no acute distress. Respiratory normal breathing without difficulty. Psychiatric this patient is able to make decisions and demonstrates good insight into disease process. Alert and Oriented x 3. pleasant and cooperative. Notes Upon inspection patient's wound bed actually did require sharp debridement to clearway some of the necrotic debris. He actually tolerated that debridement today without complication and postdebridement wound bed appears to be doing significantly better which is great news. He still has some debris to remove however. I would recommend dressings that should hopefully benefit him in that regard. He is in agreement with this plan. He needs to make sure to stay off of this I discussed that with him today. Electronic Signature(s) Signed: 09/01/2022 4:32:11 PM By: Worthy Keeler PA-C Entered By: Worthy Keeler on  09/01/2022 16:32:11 -------------------------------------------------------------------------------- Physician Orders Details Patient Name: Date of Service: Larry Callahan MES F. 09/01/2022 11:00 A M Medical Record Number: [id number] Patient Account Number: [id number] Date of Birth/Sex: Treating RN: Dec 20, 1955 (67 y.o. Larry Callahan) Carlene Coria Primary Care Provider: [name] Other Clinician: Referring Provider: Treating Provider/Extender: Phillips Climes Weeks in Treatment: 2 Verbal / Phone Orders: No Diagnosis Coding ICD-10 Coding Code Description L89.012 Pressure ulcer of right elbow, stage 2 Follow-up Appointments Return Appointment in 1 week. Home Health ADMIT to Home Health for wound care. May utilize formulary equivalent dressing for wound treatment orders unless otherwise specified. Larry Callahan, Larry Callahan ([id number]) [id number] Page 4 of 7 Home Health Nurse may visit PRN to address patients wound care needs. - [facility] Bathing/ [employer] wounds with antibacterial soap and water. Anesthetic (Use 'Patient Medications' Section for Anesthetic Order Entry) Lidocaine applied to wound bed Edema Control - Lymphedema / Segmental Compressive Device / Other Elevate, Exercise Daily and A void Standing for Long Periods of Time. Elevate legs to the level of the heart and pump ankles as often as possible Elevate leg(s) parallel to the floor when sitting. Wound Treatment Wound #2 - Gluteus Wound Laterality: Right Cleanser: Byram Ancillary Kit - 15 Day Supply (DME) (Generic) 3 x Per Week/15 Days Discharge Instructions: Use supplies as instructed; Kit contains: (15) Saline Bullets; (15) 3x3 Gauze; 15 pr Gloves Cleanser: Soap and Water 3 x Per Week/15 Days Discharge Instructions: Gently cleanse wound with antibacterial soap, rinse and pat dry prior to dressing wounds Prim Dressing: Hydrofera Blue Ready Transfer Foam, 2.5x2.5 (in/in) (DME) (Generic)  3 x Per Week/15 Days ary Discharge Instructions: Apply Hydrofera Blue Ready to wound bed as directed Secondary Dressing: [place] Dressing, 4x4 (in/in) (DME) (Generic) 3 x Per Week/15 Days elfa Discharge Instructions: Apply over dressing to secure in place. Electronic Signature(s) Signed: 09/01/2022 5:06:45 PM By: Worthy Keeler PA-C Entered By: Carlene Coria on 09/01/2022 11:50:01 -------------------------------------------------------------------------------- Problem List Details Patient Name: Date of Service: Larry Callahan MES F. 09/01/2022 11:00 A M Medical Record Number: [id number] Patient Account Number: [id number] Date of Birth/Sex: Treating RN: 14-Sep-1955 (67 y.o.  Larry Callahan Primary Care Provider: [name] Other Clinician: Referring Provider: Treating Provider/Extender: Phillips Climes Weeks in Treatment: 2 Active Problems ICD-10 Encounter Code Description Active Date MDM Diagnosis L02.31 Cutaneous abscess of buttock 08/15/2022 No Yes Inactive Problems Resolved Problems Electronic Signature(s) Signed: 09/01/2022 4:30:51 PM By: Worthy Keeler PA-C Previous Signature: 09/01/2022 10:43:22 AM Version By: Worthy Keeler PA-C Entered By: Worthy Keeler on 09/01/2022 16:30:51 Larry Callahan ([id number]) [id number] Page 5 of 7 -------------------------------------------------------------------------------- Progress Note Details Patient Name: Date of Service: Larry Callahan MES F. 09/01/2022 11:00 A M Medical Record Number: [id number] Patient Account Number: [id number] Date of Birth/Sex: Treating RN: 11/19/1955 (67 y.o. Larry Callahan) Carlene Coria Primary Care Provider: [name] Other Clinician: Referring Provider: Treating Provider/Extender: Phillips Climes Weeks in Treatment: 2 Subjective Chief Complaint Information obtained from Patient Right gluteal abscess History of Present Illness  (HPI) ADMISSION 08/15/2022 This is a 67 year old man who came in with very little medical information. There was some suggestion that he was in the hospital last month with cellulitis of the arm I will need to check this out thoroughly. He tells me that he received 2 courses of antibiotics last month were not really sure which ones they were but they are completed now. He is not really having any discomfort. His sister who lives nearby is dressing this with what sounds like Coban. I looked through [facility] health link. He was in the hospital about 3 weeks ago for an infection of the right elbow. Prior to that he had been in an urgent care receiving Keflex. I do not think he was admitted to hospital but may have spent some time in the ER. I do not see any cultures. 3/11; patient presents for follow-up. He has been using Hydrofera Blue to the right elbow. The wound has healed. Readmission: 09-01-2022 upon evaluation today patient presents for reevaluation here in the clinic he actually was previously seen for his elbow today he comes in with the elbow okay but unfortunately a right gluteal abscess. Unfortunately he tells me that this is somewhat painful. He was seen in the ER they gave him doxycycline he has not picked that up yet that was just yesterday. Objective Constitutional Obese and well-hydrated in no acute distress. Vitals Time Taken: 11:10 AM, Height: 71 in, Weight: 229 lbs, BMI: 31.9, Temperature: 97.6 F, Pulse: 62 bpm, Respiratory Rate: 18 breaths/min, Blood Pressure: 160/87 mmHg. Respiratory normal breathing without difficulty. Psychiatric this patient is able to make decisions and demonstrates good insight into disease process. Alert and Oriented x 3. pleasant and cooperative. General Notes: Upon inspection patient's wound bed actually did require sharp debridement to clearway some of the necrotic debris. He actually tolerated that debridement today without complication and  postdebridement wound bed appears to be doing significantly better which is great news. He still has some debris to remove however. I would recommend dressings that should hopefully benefit him in that regard. He is in agreement with this plan. He needs to make sure to stay off of this I discussed that with him today. Integumentary (Hair, Skin) Wound #2 status is Open. Original cause of wound was Gradually Appeared. The date acquired was: 08/29/2022. The wound is located on the Right Gluteus. The wound measures 2.5cm length x 3.5cm width x 0.3cm depth; 6.872cm^2 area and 2.062cm^3 volume. There is no tunneling or undermining noted. There is a medium amount of sanguinous drainage noted. There is no granulation within the wound bed. There  is a large (67-100%) amount of necrotic tissue within the wound bed including Eschar and Adherent Slough. Larry Callahan, Larry Callahan ([id number]) [id number] Page 6 of 7 Assessment Active Problems ICD-10 Cutaneous abscess of buttock Procedures Wound #2 Pre-procedure diagnosis of Wound #2 is an Abscess located on the Right Gluteus . There was a Excisional Skin/Subcutaneous Tissue Debridement with a total area of 8.75 sq cm performed by Tommie Sams., PA-C. With the following instrument(s): Curette, Forceps, and Scissors to remove Viable and Non-Viable tissue/material. Material removed includes Subcutaneous Tissue and Slough and. No specimens were taken. A time out was conducted at 11:42, prior to the start of the procedure. A Moderate amount of bleeding was controlled with Pressure. The procedure was tolerated well with a pain level of 3 throughout and a pain level of 0 following the procedure. Post Debridement Measurements: 2.5cm length x 3.5cm width x 0.3cm depth; 2.062cm^3 volume. Character of Wound/Ulcer Post Debridement is improved. Post procedure Diagnosis Wound #2: Same as Pre-Procedure Plan Follow-up Appointments: Return Appointment in  1 week. Home Health: ADMIT to [facility] for wound care. May utilize formulary equivalent dressing for wound treatment orders unless otherwise specified. Home Health Nurse may visit PRN to address patientoos wound care needs. - [facility] Bathing/ Shower/ Hygiene: Wash wounds with antibacterial soap and water. Anesthetic (Use 'Patient Medications' Section for Anesthetic Order Entry): Lidocaine applied to wound bed Edema Control - Lymphedema / Segmental Compressive Device / Other: Elevate, Exercise Daily and Avoid Standing for Long Periods of Time. Elevate legs to the level of the heart and pump ankles as often as possible Elevate leg(s) parallel to the floor when sitting. WOUND #2: - Gluteus Wound Laterality: Right Cleanser: Byram Ancillary Kit - 15 Day Supply (DME) (Generic) 3 x Per Week/15 Days Discharge Instructions: Use supplies as instructed; Kit contains: (15) Saline Bullets; (15) 3x3 Gauze; 15 pr Gloves Cleanser: Soap and Water 3 x Per Week/15 Days Discharge Instructions: Gently cleanse wound with antibacterial soap, rinse and pat dry prior to dressing wounds Prim Dressing: Hydrofera Blue Ready Transfer Foam, 2.5x2.5 (in/in) (DME) (Generic) 3 x Per Week/15 Days ary Discharge Instructions: Apply Hydrofera Blue Ready to wound bed as directed Secondary Dressing: [place] Dressing, 4x4 (in/in) (DME) (Generic) 3 x Per Week/15 Days elfa Discharge Instructions: Apply over dressing to secure in place. 1. I do believe that offloading is going to be the primary concern here. I discussed that with the patient in great detail. He notes that he is going to do what he can to try to stay off of this. 2. I am good recommend as well that we have the patient continue with the [facility] dressing which I think is going to be a good option here and will use a T island dressing to cover. elfa 3. He does not have anyone to help him with dressing changes for that reason working to see about  getting home health I cannot guarantee will be able to do so but we will do our best in that regard. We will see patient back for reevaluation in 1 week here in the clinic. If anything worsens or changes patient will contact our office for additional recommendations. Electronic Signature(s) Signed: 09/01/2022 4:32:52 PM By: Worthy Keeler PA-C Entered By: Worthy Keeler on 09/01/2022 16:32:52 Larry Callahan ([id number]) [id number] Page 7 of 7 -------------------------------------------------------------------------------- SuperBill Details Patient Name: Date of Service: Larry Callahan MES F. 09/01/2022 Medical Record Number: [id number] Patient Account Number: [id number] Date of Birth/Sex:  Treating RN: 1955-07-14 (67 y.o. Larry Callahan) Carlene Coria Primary Care Provider: [name] Other Clinician: Referring Provider: Treating Provider/Extender: Phillips Climes Weeks in Treatment: 2 Diagnosis Coding ICD-10 Codes Code Description L02.31 Cutaneous abscess of buttock Facility Procedures : CPT4 Code: [id number] Description: [id number] - DEB SUBQ TISSUE 20 SQ CM/< ICD-10 Diagnosis Description L02.31 Cutaneous abscess of buttock Modifier: Quantity: 1 Physician Procedures : CPT4 Code Description Modifier [id number] - WC PHYS SUBQ TISS 20 SQ CM ICD-10 Diagnosis Description L02.31 Cutaneous abscess of buttock Quantity: 1 Electronic Signature(s) Signed: 09/01/2022 4:33:19 PM By: Worthy Keeler PA-C Entered By: Worthy Keeler on 09/01/2022 16:33:19

## 2022-09-02 NOTE — Telephone Encounter (Signed)
Pt on correct dose of Eliquis 5mg  BID for afib given age < 69 and weight > 60kg. He needs to take Eliquis twice daily for it to be effective in stroke prevention. Anticoagulation does not affect weight, he can f/u with PCP if he's having concerns with weight loss, however he does have heart failure and weight can fluctuate pretty easily depending on his fluid status.

## 2022-09-02 NOTE — Telephone Encounter (Signed)
Patient is returning RN's stating he did not receive a VM, but he will be at home to answer callback. Please advise.

## 2022-09-02 NOTE — Telephone Encounter (Signed)
Pt c/o medication issue:  1. Name of Medication:   apixaban (ELIQUIS) 5 MG TABS tablet    2. How are you currently taking this medication (dosage and times per day)?   Take 1 tablet (5 mg total) by mouth 2 (two) times daily.    3. Are you having a reaction (difficulty breathing--STAT)? No  4. What is your medication issue? Pt stated he thinks him taking this medication twice a day is too much. He's losing weight and stated that he would like a callback and to leave a detailed voicemail for him if he doesn't answer because he does have to step out. Please advise.

## 2022-09-02 NOTE — Telephone Encounter (Signed)
Spoke with patient and reiterated information from pharmacist Supple, Megan E, RPH-CPP that Pt on correct dose of Eliquis 5mg  BID for afib given age < 21 and weight > 60kg. He needs to take Eliquis twice daily for it to be effective in stroke prevention. Anticoagulation does not affect weight, he can f/u with PCP if he's having concerns with weight loss, however he does have heart failure and weight can fluctuate pretty easily depending on his fluid status.   Patient stated that he just wanted to make sure. Patient stated that he would continue taking medications as prescribed.

## 2022-09-02 NOTE — Telephone Encounter (Signed)
Left detailed message per patient request stating the follow from the pharmacist Supple, Megan E, RPH-CPP as follows:  Pt on correct dose of Eliquis 5mg  BID for afib given age < 21 and weight > 60kg. He needs to take Eliquis twice daily for it to be effective in stroke prevention. Anticoagulation does not affect weight, he can f/u with PCP if he's having concerns with weight loss, however he does have heart failure and weight can fluctuate pretty easily depending on his fluid status.

## 2022-09-02 NOTE — Progress Notes (Signed)
SEBASTIN, DABDOUB ([id number]) [id number] Page 1 of 8 Visit Report for 09/01/2022 Arrival Information Details Patient Name: Date of Service: Larry Callahan MES F. 09/01/2022 11:00 A M Medical Record Number: [id number] Patient Account Number: [id number] Date of Birth/Sex: Treating RN: 25-Apr-1956 (67 y.o. Larry Callahan) Carlene Coria Primary Care Chandlor Noecker: [name] Other Clinician: Referring Beckham Buxbaum: Treating Lamoyne Hessel/Extender: Phillips Climes Weeks in Treatment: 2 Visit Information History Since Last Visit Added or deleted any medications: Yes Patient Arrived: Ambulatory Any new allergies or adverse reactions: No Arrival Time: 11:06 Had a fall or experienced change in No Accompanied By: self activities of daily living that may affect Transfer Assistance: None risk of falls: Patient Identification Verified: Yes Signs or symptoms of abuse/neglect since last visito No Secondary Verification Process Completed: Yes Hospitalized since last visit: No Patient Requires Transmission-Based Precautions: No Implantable device outside of the clinic excluding No Patient Has Alerts: Yes cellular tissue based products placed in the center Patient Alerts: Patient on Blood Thinner since last visit: Pain Present Now: No Electronic Signature(s) Unsigned Entered ByCarlene Coria on 09/01/2022 11:10:50 -------------------------------------------------------------------------------- Clinic Level of Care Assessment Details Patient Name: Date of Service: Larry Callahan MES F. 09/01/2022 11:00 A M Medical Record Number: [id number] Patient Account Number: [id number] Date of Birth/Sex: Treating RN: Nov 20, 1955 (67 y.o. Larry Callahan) Carlene Coria Primary Care Jaleena Viviani: [name] Other Clinician: Referring Carney Saxton: Treating Jerrilynn Mikowski/Extender: Phillips Climes Weeks in Treatment: 2 Clinic Level of Care Assessment Items TOOL 1 Quantity Score []  - 0 Use when EandM  and Procedure is performed on INITIAL visit ASSESSMENTS - Nursing Assessment / Reassessment []  - 0 General Physical Exam (combine w/ comprehensive assessment (listed just below) when performed on new pt. evals) []  - 0 Comprehensive Assessment (HX, ROS, Risk Assessments, Wounds Hx, etc.) KEIONTAE, GATON ([id number]) [id number] Page 2 of 8 ASSESSMENTS - Wound and Skin Assessment / Reassessment []  - 0 Dermatologic / Skin Assessment (not related to wound area) ASSESSMENTS - Ostomy and/or Continence Assessment and Care []  - 0 Incontinence Assessment and Management []  - 0 Ostomy Care Assessment and Management (repouching, etc.) PROCESS - Coordination of Care []  - 0 Simple Patient / Family Education for ongoing care []  - 0 Complex (extensive) Patient / Family Education for ongoing care []  - 0 Staff obtains Programmer, systems, Records, T Results / Process Orders est []  - 0 Staff telephones HHA, Nursing Homes / Clarify orders / etc []  - 0 Routine Transfer to another Facility (non-emergent condition) []  - 0 Routine Hospital Admission (non-emergent condition) []  - 0 New Admissions / Biomedical engineer / Ordering NPWT Apligraf, etc. , []  - 0 Emergency Hospital Admission (emergent condition) PROCESS - Special Needs []  - 0 Pediatric / Minor Patient Management []  - 0 Isolation Patient Management []  - 0 Hearing / Language / Visual special needs []  - 0 Assessment of Community assistance (transportation, D/C planning, etc.) []  - 0 Additional assistance / Altered mentation []  - 0 Support Surface(s) Assessment (bed, cushion, seat, etc.) INTERVENTIONS - Miscellaneous []  - 0 External ear exam []  - 0 Patient Transfer (multiple staff / Civil Service fast streamer / Similar devices) []  - 0 Simple Staple / Suture removal (25 or less) []  - 0 Complex Staple / Suture removal (26 or more) []  - 0 Hypo/Hyperglycemic Management (do not check if billed separately) []  - 0 Ankle /  Brachial Index (ABI) - do not check if billed separately Has the patient been seen at the hospital within the last three years: Yes Total Score:  0 Level Of Care: ____ Electronic Signature(s) Unsigned Entered By: Carlene Coria on 09/01/2022 11:50:11 -------------------------------------------------------------------------------- Encounter Discharge Information Details Patient Name: Date of Service: Larry Callahan MES F. 09/01/2022 11:00 A M Medical Record Number: [id number] Patient Account Number: [id number] Date of Birth/Sex: Treating RN: 11-18-1955 (67 y.o. Larry Callahan Primary Care Aziya Arena: [name] Other Clinician: RENNER, SEABROOK ([id number]) [id number] Page 3 of 8 Referring Constantin Hillery: Treating Shelise Maron/Extender: Phillips Climes Weeks in Treatment: 2 Encounter Discharge Information Items Post Procedure Vitals Discharge Condition: Stable Temperature (F): 97.6 Ambulatory Status: Ambulatory Pulse (bpm): 62 Discharge Destination: Home Respiratory Rate (breaths/min): 18 Transportation: Private Auto Blood Pressure (mmHg): 160/87 Accompanied By: self Schedule Follow-up Appointment: Yes Clinical Summary of Care: Electronic Signature(s) Unsigned Entered ByCarlene Coria on 09/01/2022 11:54:34 -------------------------------------------------------------------------------- Lower Extremity Assessment Details Patient Name: Date of Service: Larry Callahan MES F. 09/01/2022 11:00 A M Medical Record Number: [id number] Patient Account Number: [id number] Date of Birth/Sex: Treating RN: 07/17/1955 (67 y.o. Larry Callahan) Carlene Coria Primary Care Danicia Terhaar: [name] Other Clinician: Referring Nichele Slawson: Treating Kymari Nuon/Extender: Phillips Climes Weeks in Treatment: 2 Electronic Signature(s) Unsigned Entered By: Carlene Coria on 09/01/2022  11:16:27 -------------------------------------------------------------------------------- Multi Wound Chart Details Patient Name: Date of Service: Larry Callahan MES F. 09/01/2022 11:00 A M Medical Record Number: [id number] Patient Account Number: [id number] Date of Birth/Sex: Treating RN: 1956-03-14 (67 y.o. Larry Callahan Primary Care Alekzander Cardell: [name] Other Clinician: Referring Aurelius Gildersleeve: Treating Braelynne Garinger/Extender: Phillips Climes Weeks in Treatment: 2 Vital Signs Height(in): 71 Pulse(bpm): 62 Weight(lbs): 229 Blood Pressure(mmHg): 160/87 Body Mass Index(BMI): 31.9 ERNIE, HALAS F ([id number]) [id number] Page 4 of 8 Temperature(F): 97.6 Respiratory Rate(breaths/min): 18 [2:Photos:] [N/A:N/A] Right Gluteus N/A N/A Wound Location: Gradually Appeared N/A N/A Wounding Event: Abscess N/A N/A Primary Etiology: Chronic Obstructive Pulmonary N/A N/A Comorbid History: Disease (COPD), Arrhythmia, Coronary Artery Disease, Hypertension, Type II Diabetes 08/29/2022 N/A N/A Date Acquired: 0 N/A N/A Weeks of Treatment: Open N/A N/A Wound Status: No N/A N/A Wound Recurrence: 2.5x3.5x0.3 N/A N/A Measurements L x W x D (cm) 6.872 N/A N/A A (cm) : rea 2.062 N/A N/A Volume (cm) : Full Thickness Without Exposed N/A N/A Classification: Support Structures Medium N/A N/A Exudate A mount: Sanguinous N/A N/A Exudate Type: red N/A N/A Exudate Color: None Present (0%) N/A N/A Granulation Amount: Large (67-100%) N/A N/A Necrotic Amount: Eschar, Adherent Slough N/A N/A Necrotic Tissue: Fascia: No N/A N/A Exposed Structures: Fat Layer (Subcutaneous Tissue): No Tendon: No Muscle: No Joint: No Bone: No None N/A N/A Epithelialization: Treatment Notes Electronic Signature(s) Unsigned Entered ByCarlene Coria on 09/01/2022  11:16:30 -------------------------------------------------------------------------------- Multi-Disciplinary Care Plan Details Patient Name: Date of Service: Larry Callahan MES F. 09/01/2022 11:00 A M Medical Record Number: [id number] Patient Account Number: [id number] Date of Birth/Sex: Treating RN: 18-Aug-1955 (67 y.o. Larry Callahan) Carlene Coria Primary Care Emberli Ballester: [name] Other Clinician: Referring Chyler Creely: Treating Lidwina Kaner/Extender: Phillips Climes Weeks in Treatment: [address] ARLON, TINKEY F ([id number]) [id number] Page 5 of 8 Necrotic Tissue Nursing Diagnoses: Knowledge deficit related to management of necrotic/devitalized tissue Goals: Necrotic/devitalized tissue will be minimized in the wound bed Date Initiated: 09/01/2022 Target Resolution Date: 10/02/2022 Goal Status: Active Interventions: Assess patient pain level pre-, during and post procedure and prior to discharge Provide education on necrotic tissue and debridement process Notes: Electronic Signature(s) Unsigned Entered ByCarlene Coria on 09/01/2022 11:16:59 -------------------------------------------------------------------------------- Pain Assessment Details Patient Name: Date of Service: Larry Callahan MES F. 09/01/2022 11:00 A  M Medical Record Number: [id number] Patient Account Number: [id number] Date of Birth/Sex: Treating RN: [birth year] (67 y.o. Larry Callahan) Carlene Coria Primary Care Heba Ige: [name] Other Clinician: Referring Cecilia Nishikawa: Treating Fredricka Kohrs/Extender: Phillips Climes Weeks in Treatment: 2 Active Problems Location of Pain Severity and Description of Pain Patient Has Paino Yes Site Locations With Dressing Change: Yes Duration of the Pain. Constant / Intermittento Constant Rate the pain. Current Pain Level: 8 Worst Pain Level: 10 Least Pain Level: 3 Tolerable Pain Level: 5 Character of Pain Describe the Pain: Burning Pain  Management and Medication Current Pain Management: Medication: Yes Cold Application: No WHITLEY, ZARLING ([id number]) [id number] Page 6 of 8 Rest: Yes Massage: No Activity: No T.E.N.S.: No Heat Application: No Leg drop or elevation: No Is the Current Pain Management Adequate: Inadequate How does your wound impact your activities of daily livingo Sleep: Yes Bathing: No Appetite: No Relationship With Others: No Bladder Continence: No Emotions: No Bowel Continence: No Work: No Toileting: No Drive: No Dressing: No Hobbies: No Electronic Signature(s) Unsigned Entered ByCarlene Coria on 09/01/2022 11:11:49 -------------------------------------------------------------------------------- Patient/Caregiver Education Details Patient Name: Date of Service: Larry Callahan MES F. 3/21/2024andnbsp11:00 [place] Record Number: [id number] Patient Account Number: [id number] Date of Birth/Gender: Treating RN: 1955-11-23 (67 y.o. Larry Callahan Primary Care Physician: [name] Other Clinician: Referring Physician: Treating Physician/Extender: Phillips Climes Weeks in Treatment: 2 Education Assessment Education Provided To: Patient Education Topics Provided Pressure: Methods: Explain/Verbal Responses: State content correctly Electronic Signature(s) Unsigned Entered ByCarlene Coria on 09/01/2022 11:17:12 Signature(s): Chase Caller ([id number]) 125 Date(s): [id number] Page 7 of 8 -------------------------------------------------------------------------------- Wound Assessment Details Patient Name: Date of Service: Larry Callahan MES F. 09/01/2022 11:00 A M Medical Record Number: [id number] Patient Account Number: [id number] Date of Birth/Sex: Treating RN: [birth year] (67 y.o. Larry Callahan) Carlene Coria Primary Care Hasset Chaviano: [name] Other Clinician: Referring Couper Juncaj: Treating Halsey Hammen/Extender: Phillips Climes Weeks in Treatment: 2 Wound Status Wound Number: 2 Primary Abscess Etiology: Wound Location: Right Gluteus Wound Open Wounding Event: Gradually Appeared Status: Date Acquired: 08/29/2022 Comorbid Chronic Obstructive Pulmonary Disease (COPD), Arrhythmia, Weeks Of Treatment: 0 History: Coronary Artery Disease, Hypertension, Type II Diabetes Clustered Wound: No Photos Wound Measurements Length: (cm) 2.5 Width: (cm) 3.5 Depth: (cm) 0.3 Area: (cm) 6.872 Volume: (cm) 2.062 % Reduction in Area: % Reduction in Volume: Epithelialization: None Tunneling: No Undermining: No Wound Description Classification: Full Thickness Without Exposed Support Structures Exudate Amount: Medium Exudate Type: Sanguinous Exudate Color: red Foul Odor After Cleansing: No Slough/Fibrino Yes Wound Bed Granulation Amount: None Present (0%) Exposed Structure Necrotic Amount: Large (67-100%) Fascia Exposed: No Necrotic Quality: Eschar, Adherent Slough Fat Layer (Subcutaneous Tissue) Exposed: No Tendon Exposed: No Muscle Exposed: No Joint Exposed: No Bone Exposed: No Treatment Notes Wound #2 (Gluteus) Wound Laterality: Right Cleanser [place] Ancillary Kit - 15 Day Supply Discharge Instruction: Use supplies as instructed; Kit contains: (15) Saline Bullets; (15) 3x3 Gauze; 15 pr Gloves Soap and Water Discharge Instruction: Gently cleanse wound with antibacterial soap, rinse and pat dry prior to dressing wounds Peri-Wound Care Topical Primary Dressing Hydrofera Blue Ready Transfer Foam, 2.5x2.5 (in/in) Discharge Instruction: Apply Hydrofera Blue Ready to wound bed as directed Secondary Dressing T Adhesive [address], 4x4 (in/in) elfa TRASHAUN, MULKINS ([id number]) [id number] Page 8 of 8 Discharge Instruction: Apply over dressing to secure in place. Secured With Compression Wrap Compression Stockings Add-Ons Electronic  Signature(s) Unsigned Entered ByCarlene Coria on 09/01/2022 11:16:15 -------------------------------------------------------------------------------- Vitals Details Patient  Name: Date of Service: Larry Callahan MES F. 09/01/2022 11:00 A M Medical Record Number: [id number] Patient Account Number: [id number] Date of Birth/Sex: Treating RN: 1955/11/14 (67 y.o. Larry Callahan) Carlene Coria Primary Care Selma Rodelo: [name] Other Clinician: Referring Trenice Mesa: Treating Keanu Frickey/Extender: Phillips Climes Weeks in Treatment: 2 Vital Signs Time Taken: 11:10 Temperature (F): 97.6 Height (in): 71 Pulse (bpm): 62 Weight (lbs): 229 Respiratory Rate (breaths/min): 18 Body Mass Index (BMI): 31.9 Blood Pressure (mmHg): 160/87 Reference Range: 80 - 120 mg / dl Electronic Signature(s) Unsigned Entered ByCarlene Coria on 09/01/2022 11:11:10 Signature(s): Date(s):

## 2022-09-05 ENCOUNTER — Telehealth: Payer: Self-pay

## 2022-09-05 ENCOUNTER — Other Ambulatory Visit: Payer: Self-pay | Admitting: *Deleted

## 2022-09-05 DIAGNOSIS — J449 Chronic obstructive pulmonary disease, unspecified: Secondary | ICD-10-CM | POA: Diagnosis not present

## 2022-09-05 DIAGNOSIS — E119 Type 2 diabetes mellitus without complications: Secondary | ICD-10-CM | POA: Diagnosis not present

## 2022-09-05 DIAGNOSIS — I2510 Atherosclerotic heart disease of native coronary artery without angina pectoris: Secondary | ICD-10-CM | POA: Diagnosis not present

## 2022-09-05 DIAGNOSIS — I509 Heart failure, unspecified: Secondary | ICD-10-CM | POA: Diagnosis not present

## 2022-09-05 DIAGNOSIS — G4733 Obstructive sleep apnea (adult) (pediatric): Secondary | ICD-10-CM | POA: Diagnosis not present

## 2022-09-05 DIAGNOSIS — I110 Hypertensive heart disease with heart failure: Secondary | ICD-10-CM | POA: Diagnosis not present

## 2022-09-05 DIAGNOSIS — L0231 Cutaneous abscess of buttock: Secondary | ICD-10-CM | POA: Diagnosis not present

## 2022-09-05 DIAGNOSIS — E785 Hyperlipidemia, unspecified: Secondary | ICD-10-CM | POA: Diagnosis not present

## 2022-09-05 DIAGNOSIS — Z602 Problems related to living alone: Secondary | ICD-10-CM | POA: Diagnosis not present

## 2022-09-05 MED ORDER — FUROSEMIDE 40 MG PO TABS: 40.0000 mg | ORAL_TABLET | Freq: Every day | ORAL | 3 refills | Status: DC

## 2022-09-05 NOTE — Telephone Encounter (Signed)
Called patient to try and schedule a sooner appointment after recent ED visit.  Patient refused earlier appointment. He states he has too much going on and too many appointments and he can't come again before his appointment on June 11,2024. Encouraged patient to call sooner if he begins to have symptoms related to heart failure, including weight gain, shortness of breath, and swelling.  Patient verbalized agreement.

## 2022-09-07 DIAGNOSIS — I2510 Atherosclerotic heart disease of native coronary artery without angina pectoris: Secondary | ICD-10-CM | POA: Diagnosis not present

## 2022-09-07 DIAGNOSIS — G4733 Obstructive sleep apnea (adult) (pediatric): Secondary | ICD-10-CM | POA: Diagnosis not present

## 2022-09-07 DIAGNOSIS — I110 Hypertensive heart disease with heart failure: Secondary | ICD-10-CM | POA: Diagnosis not present

## 2022-09-07 DIAGNOSIS — L0231 Cutaneous abscess of buttock: Secondary | ICD-10-CM | POA: Diagnosis not present

## 2022-09-07 DIAGNOSIS — E785 Hyperlipidemia, unspecified: Secondary | ICD-10-CM | POA: Diagnosis not present

## 2022-09-07 DIAGNOSIS — Z602 Problems related to living alone: Secondary | ICD-10-CM | POA: Diagnosis not present

## 2022-09-07 DIAGNOSIS — E119 Type 2 diabetes mellitus without complications: Secondary | ICD-10-CM | POA: Diagnosis not present

## 2022-09-07 DIAGNOSIS — I509 Heart failure, unspecified: Secondary | ICD-10-CM | POA: Diagnosis not present

## 2022-09-07 DIAGNOSIS — J449 Chronic obstructive pulmonary disease, unspecified: Secondary | ICD-10-CM | POA: Diagnosis not present

## 2022-09-08 ENCOUNTER — Encounter: Payer: Medicare PPO | Admitting: Physician Assistant

## 2022-09-08 DIAGNOSIS — L0231 Cutaneous abscess of buttock: Secondary | ICD-10-CM | POA: Diagnosis not present

## 2022-09-08 DIAGNOSIS — L89012 Pressure ulcer of right elbow, stage 2: Secondary | ICD-10-CM | POA: Diagnosis not present

## 2022-09-08 NOTE — Progress Notes (Addendum)
PETAR, REDDER ([id number]) [id number] Page 1 of 5 Visit Report for 09/08/2022 Chief Complaint Document Details Patient Name: Date of Service: Larry Callahan MES F. 09/08/2022 12:30 PM Medical Record Number: [id number] Patient Account Number: [id number] Date of Birth/Sex: Treating RN: [birth year] (67 y.o. Larry Callahan) Carlene Coria Primary Care Provider: [name] Other Clinician: Referring Provider: Treating Provider/Extender: Phillips Climes Weeks in Treatment: 3 Information Obtained from: Patient Chief Complaint Right gluteal abscess Electronic Signature(s) Signed: 09/08/2022 12:49:22 PM By: Worthy Keeler PA-C Entered By: Worthy Keeler on 09/08/2022 12:49:22 -------------------------------------------------------------------------------- HPI Details Patient Name: Date of Service: Larry Callahan MES F. 09/08/2022 12:30 PM Medical Record Number: [id number] Patient Account Number: [id number] Date of Birth/Sex: Treating RN: 1956/02/04 (67 y.o. Larry Callahan Primary Care Provider: [name] Other Clinician: Referring Provider: Treating Provider/Extender: Phillips Climes Weeks in Treatment: 3 History of Present Illness HPI Description: ADMISSION 08/15/2022 This is a 67 year old man who came in with very little medical information. There was some suggestion that he was in the hospital last month with cellulitis of the arm I will need to check this out thoroughly. He tells me that he received 2 courses of antibiotics last month were not really sure which ones they were but they are completed now. He is not really having any discomfort. His sister who lives nearby is dressing this with what sounds like Coban. I looked through [facility] health link. He was in the hospital about 3 weeks ago for an infection of the right elbow. Prior to that he had been in an urgent care receiving Keflex. I do not think he was admitted to hospital but may  have spent some time in the ER. I do not see any cultures. 3/11; patient presents for follow-up. He has been using Hydrofera Blue to the right elbow. The wound has healed. Readmission: 09-01-2022 upon evaluation today patient presents for reevaluation here in the clinic he actually was previously seen for his elbow today he comes in with the elbow okay but unfortunately a right gluteal abscess. Unfortunately he tells me that this is somewhat painful. He was seen in the ER they gave him doxycycline he has not picked that up yet that was just yesterday. 09-08-2022 upon evaluation today patient's wound actually appears to be excellent. In fact he called in stating that was bleeding profusely have a note in communications concerning that. Nonetheless the home health nurse and reassured me he was doing okay and this appears to be doing just fine today as well. I think it is actually showing less pressure compared to last time I saw him as far as the overall surrounding tissue and how things appear. Electronic Signature(s) Signed: 09/08/2022 1:07:23 PM By: Worthy Keeler PA-C Entered By: Worthy Keeler on 09/08/2022 13:07:23 -------------------------------------------------------------------------------- Physical Exam Details Patient Name: Date of Service: Larry Callahan MES F. 09/08/2022 12:30 PM Medical Record Number: [id number] Patient Account Number: [id number] Date of Birth/Sex: Treating RN: [birth year] (67 y.o. Larry Callahan) Carlene Coria Primary Care Provider: [name] Other Clinician: Referring Provider: Treating Provider/Extender: Phillips Climes Weeks in Treatment: [address] Larry Callahan, Larry Callahan ([id number]) [id number] Page 2 of 5 Constitutional Well-nourished and well-hydrated in no acute distress. Respiratory normal breathing without difficulty. Psychiatric this patient is able to make decisions and demonstrates good insight into disease process. Alert and  Oriented x 3. pleasant and cooperative. Notes Upon inspection patient's wound bed actually showed signs of good granulation epithelization at this point. Fortunately I do  not see any signs of infection locally nor systemically which is great news and in general I am extremely pleased with where we stand today. Electronic Signature(s) Signed: 09/08/2022 1:07:35 PM By: Worthy Keeler PA-C Entered By: Worthy Keeler on 09/08/2022 13:07:35 -------------------------------------------------------------------------------- Physician Orders Details Patient Name: Date of Service: Larry Callahan MES F. 09/08/2022 12:30 PM Medical Record Number: [id number] Patient Account Number: [id number] Date of Birth/Sex: Treating RN: 1955/06/19 (67 y.o. Larry Callahan) Carlene Coria Primary Care Provider: [name] Other Clinician: Referring Provider: Treating Provider/Extender: Phillips Climes Weeks in Treatment: 3 Verbal / Phone Orders: No Diagnosis Coding ICD-10 Coding Code Description L02.31 Cutaneous abscess of buttock Follow-up Appointments Return Appointment in 1 week. [place] for wound care. May utilize formulary equivalent dressing for wound treatment orders unless otherwise specified. Home Health Nurse may visit PRN to address patients wound care needs. Rocky Morel [phone] Anesthetic (Use 'Patient Medications' Section for Anesthetic Order Entry) Lidocaine applied to wound bed Edema Control - Lymphedema / Segmental Compressive Device / Other Elevate, Exercise Daily and A void Standing for Long Periods of Time. Elevate legs to the level of the heart and pump ankles as often as possible Elevate leg(s) parallel to the floor when sitting. Wound Treatment Wound #2 - Gluteus Wound Laterality: Right Cleanser: Soap and Water 3 x Per Week/15 Days Discharge Instructions: Gently cleanse wound with antibacterial soap, rinse and pat dry prior to dressing wounds Prim  Dressing: Hydrofera Blue Ready Transfer Foam, 2.5x2.5 (in/in) (Generic) 3 x Per Week/15 Days ary Discharge Instructions: Apply Hydrofera Blue Ready to wound bed as directed Secondary Dressing: T Adhesive [employer], 4x4 (in/in) (Generic) 3 x Per Week/15 Days elfa Discharge Instructions: Apply over dressing to secure in place. Electronic Signature(s) Signed: 09/08/2022 1:12:26 PM By: Carlene Coria RN Signed: 09/08/2022 4:02:24 PM By: Worthy Keeler PA-C Entered By: Carlene Coria on 09/08/2022 13:12:26 Problem List Details -------------------------------------------------------------------------------- Larry Callahan ([id number]) [id number] Page 3 of 5 Patient Name: Date of Service: Larry Callahan MES F. 09/08/2022 12:30 PM Medical Record Number: [id number] Patient Account Number: [id number] Date of Birth/Sex: Treating RN: 1955-08-01 (67 y.o. Larry Callahan) Carlene Coria Primary Care Provider: [name] Other Clinician: Referring Provider: Treating Provider/Extender: Phillips Climes Weeks in Treatment: 3 Active Problems ICD-10 Encounter Code Description Active Date MDM Diagnosis L02.31 Cutaneous abscess of buttock 08/15/2022 No Yes Inactive Problems Resolved Problems Electronic Signature(s) Signed: 09/08/2022 12:49:19 PM By: Worthy Keeler PA-C Entered By: Worthy Keeler on 09/08/2022 12:49:18 -------------------------------------------------------------------------------- Progress Note Details Patient Name: Date of Service: Larry Callahan MES F. 09/08/2022 12:30 PM Medical Record Number: [id number] Patient Account Number: [id number] Date of Birth/Sex: Treating RN: 25-Sep-1955 (67 y.o. Larry Callahan Primary Care Provider: [name] Other Clinician: Referring Provider: Treating Provider/Extender: Phillips Climes Weeks in Treatment: 3 Subjective Chief Complaint Information obtained from Patient Right gluteal  abscess History of Present Illness (HPI) ADMISSION 08/15/2022 This is a 67 year old man who came in with very little medical information. There was some suggestion that he was in the hospital last month with cellulitis of the arm I will need to check this out thoroughly. He tells me that he received 2 courses of antibiotics last month were not really sure which ones they were but they are completed now. He is not really having any discomfort. His sister who lives nearby is dressing this with what sounds like Coban. I looked through [facility] health link. He was in the hospital  about 3 weeks ago for an infection of the right elbow. Prior to that he had been in an urgent care receiving Keflex. I do not think he was admitted to hospital but may have spent some time in the ER. I do not see any cultures. 3/11; patient presents for follow-up. He has been using Hydrofera Blue to the right elbow. The wound has healed. Readmission: 09-01-2022 upon evaluation today patient presents for reevaluation here in the clinic he actually was previously seen for his elbow today he comes in with the elbow okay but unfortunately a right gluteal abscess. Unfortunately he tells me that this is somewhat painful. He was seen in the ER they gave him doxycycline he has not picked that up yet that was just yesterday. 09-08-2022 upon evaluation today patient's wound actually appears to be excellent. In fact he called in stating that was bleeding profusely have a note in communications concerning that. Nonetheless the home health nurse and reassured me he was doing okay and this appears to be doing just fine today as well. I think it is actually showing less pressure compared to last time I saw him as far as the overall surrounding tissue and how things appear. Objective Constitutional Well-nourished and well-hydrated in no acute distress. Larry Callahan, Larry Callahan ([id number]) [id number] Page 4 of 5 Vitals Time  Taken: 12:42 PM, Height: 71 in, Weight: 229 lbs, BMI: 31.9, Temperature: 98.7 F, Pulse: 88 bpm, Respiratory Rate: 18 breaths/min, Blood Pressure: 175/78 mmHg. Respiratory normal breathing without difficulty. Psychiatric this patient is able to make decisions and demonstrates good insight into disease process. Alert and Oriented x 3. pleasant and cooperative. General Notes: Upon inspection patient's wound bed actually showed signs of good granulation epithelization at this point. Fortunately I do not see any signs of infection locally nor systemically which is great news and in general I am extremely pleased with where we stand today. Integumentary (Hair, Skin) Wound #2 status is Open. Original cause of wound was Gradually Appeared. The date acquired was: 08/29/2022. The wound has been in treatment 1 weeks. The wound is located on the Right Gluteus. The wound measures 2.5cm length x 3.3cm width x 0.3cm depth; 6.48cm^2 area and 1.944cm^3 volume. There is no tunneling or undermining noted. There is a medium amount of sanguinous drainage noted. There is no granulation within the wound bed. There is a large (67- 100%) amount of necrotic tissue within the wound bed including Eschar and Adherent Slough. Assessment Active Problems ICD-10 Cutaneous abscess of buttock Plan 1. I am and I suggest we have the patient continue to monitor for any signs of infection or worsening based on what I am seeing I do believe that the patient is making very good progress here. It still can take some time to get this to completely heal. 2. I am good recommend as well to continue the [facility] and the [employer] dressing to cover. elfa We will see patient back for reevaluation in 1 week here in the clinic. If anything worsens or changes patient will contact our office for additional recommendations. Electronic Signature(s) Signed: 09/08/2022 1:08:18 PM By: Worthy Keeler PA-C Entered By: Worthy Keeler on  09/08/2022 13:08:18 -------------------------------------------------------------------------------- SuperBill Details Patient Name: Date of Service: Larry Callahan MES F. 09/08/2022 Medical Record Number: [id number] Patient Account Number: [id number] Date of Birth/Sex: Treating RN: 03-06-1956 (67 y.o. Larry Callahan Primary Care Provider: [name] Other Clinician: Referring Provider: Treating Provider/Extender: Phillips Climes Weeks in Treatment: 3  Diagnosis Coding ICD-10 Codes Code Description L02.31 Cutaneous abscess of buttock Facility Procedures : CPT4 Code: [id number] Description: [id number] - WOUND CARE VISIT-LEV 3 EST PT Modifier: Quantity: 1 Physician Procedures : CPT4 Code Description Modifier [id number] - WC PHYS LEVEL 3 - EST PT ICD-10 Diagnosis Description L02.31 Cutaneous abscess of buttock Larry Callahan, Larry Callahan ([id number]) [id number] Page 5 of Quantity: 1 5 Electronic Signature(s) Signed: 09/08/2022 1:13:19 PM By: Carlene Coria RN Signed: 09/08/2022 4:02:24 PM By: Worthy Keeler PA-C Previous Signature: 09/08/2022 1:08:41 PM Version By: Worthy Keeler PA-C Entered By: Carlene Coria on 09/08/2022 13:13:19

## 2022-09-09 DIAGNOSIS — I110 Hypertensive heart disease with heart failure: Secondary | ICD-10-CM | POA: Diagnosis not present

## 2022-09-09 DIAGNOSIS — L0231 Cutaneous abscess of buttock: Secondary | ICD-10-CM | POA: Diagnosis not present

## 2022-09-09 DIAGNOSIS — E785 Hyperlipidemia, unspecified: Secondary | ICD-10-CM | POA: Diagnosis not present

## 2022-09-09 DIAGNOSIS — I509 Heart failure, unspecified: Secondary | ICD-10-CM | POA: Diagnosis not present

## 2022-09-09 DIAGNOSIS — Z602 Problems related to living alone: Secondary | ICD-10-CM | POA: Diagnosis not present

## 2022-09-09 DIAGNOSIS — E119 Type 2 diabetes mellitus without complications: Secondary | ICD-10-CM | POA: Diagnosis not present

## 2022-09-09 DIAGNOSIS — G4733 Obstructive sleep apnea (adult) (pediatric): Secondary | ICD-10-CM | POA: Diagnosis not present

## 2022-09-09 DIAGNOSIS — J449 Chronic obstructive pulmonary disease, unspecified: Secondary | ICD-10-CM | POA: Diagnosis not present

## 2022-09-09 DIAGNOSIS — I2510 Atherosclerotic heart disease of native coronary artery without angina pectoris: Secondary | ICD-10-CM | POA: Diagnosis not present

## 2022-09-09 NOTE — Progress Notes (Signed)
YICHEN, SIVERS ([id number]) [id number] Page 1 of 7 Visit Report for 09/08/2022 Arrival Information Details Patient Name: Date of Service: Larry Callahan MES F. 09/08/2022 12:30 PM Medical Record Number: [id number] Patient Account Number: [id number] Date of Birth/Sex: Treating RN: 10-11-1955 (67 y.o. Larry Callahan) Carlene Coria Primary Care Dream Harman: [name] Other Clinician: Referring Gabreille Dardis: Treating Damire Remedios/Extender: Phillips Climes Weeks in Treatment: 3 Visit Information History Since Last Visit Added or deleted any medications: No Patient Arrived: Ambulatory Any new allergies or adverse reactions: No Arrival Time: 12:42 Had a fall or experienced change in No Accompanied By: self activities of daily living that may affect Transfer Assistance: None risk of falls: Patient Identification Verified: Yes Signs or symptoms of abuse/neglect since last visito No Secondary Verification Process Completed: Yes Hospitalized since last visit: No Patient Requires Transmission-Based Precautions: No Implantable device outside of the clinic excluding No Patient Has Alerts: Yes cellular tissue based products placed in the center Patient Alerts: Patient on Blood Thinner since last visit: Has Dressing in Place as Prescribed: Yes Pain Present Now: No Electronic Signature(s) Signed: 09/09/2022 1:09:18 PM By: Carlene Coria RN Entered By: Carlene Coria on 09/08/2022 12:42:45 -------------------------------------------------------------------------------- Clinic Level of Care Assessment Details Patient Name: Date of Service: Larry Callahan MES F. 09/08/2022 12:30 PM Medical Record Number: [id number] Patient Account Number: [id number] Date of Birth/Sex: Treating RN: Oct 20, 1955 (67 y.o. Larry Callahan) Carlene Coria Primary Care Janara Klett: [name] Other Clinician: Referring Eleanor Gatliff: Treating Latricia Cerrito/Extender: Phillips Climes Weeks in Treatment: 3 Clinic  Level of Care Assessment Items TOOL 4 Quantity Score X- 1 0 Use when only an EandM is performed on FOLLOW-UP visit ASSESSMENTS - Nursing Assessment / Reassessment X- 1 10 Reassessment of Co-morbidities (includes updates in patient status) X- 1 5 Reassessment of Adherence to Treatment Plan ASSESSMENTS - Wound and Skin A ssessment / Reassessment X - Simple Wound Assessment / Reassessment - one wound 1 5 []  - 0 Complex Wound Assessment / Reassessment - multiple wounds []  - 0 Dermatologic / Skin Assessment (not related to wound area) ASSESSMENTS - Focused Assessment []  - 0 Circumferential Edema Measurements - multi extremities []  - 0 Nutritional Assessment / Counseling / Intervention []  - 0 Lower Extremity Assessment (monofilament, tuning fork, pulses) []  - 0 Peripheral Arterial Disease Assessment (using hand held doppler) ASSESSMENTS - Ostomy and/or Continence Assessment and Care []  - 0 Incontinence Assessment and Management []  - 0 Ostomy Care Assessment and Management (repouching, etc.) PROCESS - Coordination of Care X - Simple Patient / Family Education for ongoing care 1 [address] [place] ([id number]) [id number] Page 2 of 7 []  - 0 Complex (extensive) Patient / Family Education for ongoing care []  - 0 Staff obtains Programmer, systems, Records, T Results / Process Orders est []  - 0 Staff telephones HHA, Nursing Homes / Clarify orders / etc []  - 0 Routine Transfer to another Facility (non-emergent condition) []  - 0 Routine Hospital Admission (non-emergent condition) []  - 0 New Admissions / Biomedical engineer / Ordering NPWT Apligraf, etc. , []  - 0 Emergency Hospital Admission (emergent condition) X- 1 10 Simple Discharge Coordination []  - 0 Complex (extensive) Discharge Coordination PROCESS - Special Needs []  - 0 Pediatric / Minor Patient Management []  - 0 Isolation Patient Management []  - 0 Hearing / Language / Visual special needs []  -  0 Assessment of Community assistance (transportation, D/C planning, etc.) []  - 0 Additional assistance / Altered mentation []  - 0 Support Surface(s) Assessment (bed, cushion, seat, etc.) INTERVENTIONS - Wound Cleansing /  Measurement X - Simple Wound Cleansing - one wound 1 5 []  - 0 Complex Wound Cleansing - multiple wounds X- 1 5 Wound Imaging (photographs - any number of wounds) []  - 0 Wound Tracing (instead of photographs) X- 1 5 Simple Wound Measurement - one wound []  - 0 Complex Wound Measurement - multiple wounds INTERVENTIONS - Wound Dressings X - Small Wound Dressing one or multiple wounds 1 10 []  - 0 Medium Wound Dressing one or multiple wounds []  - 0 Large Wound Dressing one or multiple wounds []  - 0 Application of Medications - topical []  - 0 Application of Medications - injection INTERVENTIONS - Miscellaneous []  - 0 External ear exam []  - 0 Specimen Collection (cultures, biopsies, blood, body fluids, etc.) []  - 0 Specimen(s) / Culture(s) sent or taken to Lab for analysis []  - 0 Patient Transfer (multiple staff / Civil Service fast streamer / Similar devices) []  - 0 Simple Staple / Suture removal (25 or less) []  - 0 Complex Staple / Suture removal (26 or more) []  - 0 Hypo / Hyperglycemic Management (close monitor of Blood Glucose) []  - 0 Ankle / Brachial Index (ABI) - do not check if billed separately X- 1 5 Vital Signs Has the patient been seen at the hospital within the last three years: Yes Total Score: 75 Level Of Care: New/Established - Level 2 Electronic Signature(s) Signed: 09/09/2022 1:09:18 PM By: Carlene Coria RN Entered By: Carlene Coria on 09/08/2022 13:13:10 Chase Caller ([id number]) [id number] Page 3 of 7 -------------------------------------------------------------------------------- Encounter Discharge Information Details Patient Name: Date of Service: Larry Callahan MES F. 09/08/2022 12:30 PM Medical Record Number:  [id number] Patient Account Number: [id number] Date of Birth/Sex: Treating RN: [birth year] (67 y.o. Larry Callahan) Carlene Coria Primary Care Khyle Goodell: [name] Other Clinician: Referring Keri Tavella: Treating Lorie Cleckley/Extender: Phillips Climes Weeks in Treatment: 3 Encounter Discharge Information Items Discharge Condition: Stable Ambulatory Status: Ambulatory Discharge Destination: Home Transportation: Private Auto Accompanied By: self Schedule Follow-up Appointment: Yes Clinical Summary of Care: Electronic Signature(s) Signed: 09/08/2022 1:13:56 PM By: Carlene Coria RN Entered By: Carlene Coria on 09/08/2022 13:13:56 -------------------------------------------------------------------------------- Lower Extremity Assessment Details Patient Name: Date of Service: Larry Callahan MES F. 09/08/2022 12:30 PM Medical Record Number: [id number] Patient Account Number: [id number] Date of Birth/Sex: Treating RN: Oct 12, 1955 (67 y.o. Larry Callahan) Carlene Coria Primary Care Govind Furey: [name] Other Clinician: Referring Janijah Symons: Treating Koreen Lizaola/Extender: Phillips Climes Weeks in Treatment: 3 Electronic Signature(s) Signed: 09/09/2022 1:09:18 PM By: Carlene Coria RN Entered By: Carlene Coria on 09/08/2022 12:47:35 -------------------------------------------------------------------------------- Multi Wound Chart Details Patient Name: Date of Service: Larry Callahan MES F. 09/08/2022 12:30 PM Medical Record Number: [id number] Patient Account Number: [id number] Date of Birth/Sex: Treating RN: 10/31/1955 (67 y.o. Larry Callahan) Carlene Coria Primary Care Elah Avellino: [name] Other Clinician: Referring Landy Dunnavant: Treating Lior Hoen/Extender: Phillips Climes Weeks in Treatment: 3 Vital Signs Height(in): 71 Pulse(bpm): 88 Weight(lbs): 229 Blood Pressure(mmHg): 175/78 Body Mass Index(BMI): 31.9 Temperature(F): 98.7 Respiratory Rate(breaths/min): 18 [2:Photos:]  [N/A:N/A] RAYDON, KRITZMAN ([id number]) [2:Right Gluteus Wound Location: Gradually Appeared Wounding Event: Abscess Primary Etiology: Chronic Obstructive Pulmonary Comorbid History: Disease (COPD), Arrhythmia, Coronary Artery Disease, Hypertension, Type II Diabetes 08/29/2022 Date Acquired: 1  Weeks of Treatment: Open Wound Status: No Wound Recurrence: 2.5x3.3x0.3 Measurements L x W x D (cm) 6.48 A (cm) : rea 1.944 Volume (cm) : 5.70% % Reduction in Area: 5.70% % Reduction in Volume: Full Thickness Without Exposed Classification: Support  Structures Medium Exudate A mount: Sanguinous Exudate Type: red Exudate Color:  None Present (0%) Granulation Amount: Large (67-100%) Necrotic Amount: Eschar, Adherent Slough Necrotic Tissue: Fascia: No Exposed Structures: Fat Layer (Subcutaneous Tissue):  No Tendon: No Muscle: No Joint: No Bone: No None Epithelialization:] [N/A:N/A N/A N/A N/A N/A N/A N/A N/A N/A N/A N/A N/A N/A N/A N/A N/A N/A N/A N/A N/A N/A N/A] Treatment Notes Electronic Signature(s) Signed: 09/09/2022 1:09:18 PM By: Carlene Coria RN Entered By: Carlene Coria on 09/08/2022 12:48:34 -------------------------------------------------------------------------------- Multi-Disciplinary Care Plan Details Patient Name: Date of Service: Larry Callahan MES F. 09/08/2022 12:30 PM Medical Record Number: [id number] Patient Account Number: [id number] Date of Birth/Sex: Treating RN: [birth year] (67 y.o. Larry Callahan) Carlene Coria Primary Care Phynix Horton: [name] Other Clinician: Referring Sherida Dobkins: Treating Chadwick Reiswig/Extender: Phillips Climes Weeks in Treatment: 3 Active Inactive Necrotic Tissue Nursing Diagnoses: Knowledge deficit related to management of necrotic/devitalized tissue Goals: Necrotic/devitalized tissue will be minimized in the wound bed Date Initiated: 09/01/2022 Target Resolution Date: 10/02/2022 Goal Status: Active Interventions: Assess patient pain level pre-, during and post  procedure and prior to discharge Provide education on necrotic tissue and debridement process Notes: Electronic Signature(s) Signed: 09/09/2022 1:09:18 PM By: Carlene Coria RN Entered By: Carlene Coria on 09/08/2022 12:47:50 Chase Caller ([id number]) [id number] Page 5 of 7 -------------------------------------------------------------------------------- Pain Assessment Details Patient Name: Date of Service: Larry Callahan MES F. 09/08/2022 12:30 PM Medical Record Number: [id number] Patient Account Number: [id number] Date of Birth/Sex: Treating RN: 02-03-1956 (67 y.o. Larry Callahan) Carlene Coria Primary Care Andjela Wickes: [name] Other Clinician: Referring Ardine Iacovelli: Treating Adlynn Lowenstein/Extender: Phillips Climes Weeks in Treatment: 3 Active Problems Location of Pain Severity and Description of Pain Patient Has Paino No Site Locations Pain Management and Medication Current Pain Management: Electronic Signature(s) Signed: 09/09/2022 1:09:18 PM By: Carlene Coria RN Entered By: Carlene Coria on 09/08/2022 12:43:14 -------------------------------------------------------------------------------- Patient/Caregiver Education Details Patient Name: Date of Service: Larry Callahan MES F. 3/28/2024andnbsp12:30 PM Medical Record Number: [id number] Patient Account Number: [id number] Date of Birth/Gender: Treating RN: 1955-07-24 (67 y.o. [name] Primary Care Physician: [name] Other Clinician: Referring Physician: Treating Physician/Extender: Phillips Climes Weeks in Treatment: 3 Education Assessment Education Provided To: Patient Education Topics Provided Wound/Skin Impairment: Methods: Explain/Verbal Responses: State content correctly Electronic Signature(s) Signed: 09/09/2022 1:09:18 PM By: Carlene Coria RN Entered By: Carlene Coria on 09/08/2022 12:48:03 Wound Assessment  Details -------------------------------------------------------------------------------- Chase Caller ([id number]) [id number] Page 6 of 7 Patient Name: Date of Service: Larry Callahan MES F. 09/08/2022 12:30 PM Medical Record Number: [id number] Patient Account Number: [id number] Date of Birth/Sex: Treating RN: May 06, 1956 (67 y.o. Larry Callahan) Carlene Coria Primary Care Geovanny Sartin: [name] Other Clinician: Referring Symone Cornman: Treating Tony Friscia/Extender: Phillips Climes Weeks in Treatment: 3 Wound Status Wound Number: 2 Primary Abscess Etiology: Wound Location: Right Gluteus Wound Open Wounding Event: Gradually Appeared Status: Date Acquired: 08/29/2022 Comorbid Chronic Obstructive Pulmonary Disease (COPD), Arrhythmia, Weeks Of Treatment: 1 History: Coronary Artery Disease, Hypertension, Type II Diabetes Clustered Wound: No Photos Wound Measurements Length: (cm) 2.5 % Reduction in Area: 5.7% Width: (cm) 3.3 % Reduction in Volume: 5.7% Depth: (cm) 0.3 Epithelialization: None Area: (cm) 6.48 Tunneling: No Volume: (cm) 1.944 Undermining: No Wound Description Classification: Full Thickness Without Exposed Support Structures Foul Odor After Cleansing: No Exudate Amount: Medium Slough/Fibrino Yes Exudate Type: Sanguinous Exudate Color: red Wound Bed Granulation Amount: None Present (0%) Exposed Structure Necrotic Amount: Large (67-100%) Fascia Exposed: No Necrotic Quality: Eschar, Adherent Slough Fat Layer (Subcutaneous Tissue) Exposed: No Tendon Exposed: No Muscle Exposed: No Joint  Exposed: No Bone Exposed: No Treatment Notes Wound #2 (Gluteus) Wound Laterality: Right Cleanser Soap and Water Discharge Instruction: Gently cleanse wound with antibacterial soap, rinse and pat dry prior to dressing wounds Peri-Wound Care Topical Primary Dressing Hydrofera Blue Ready Transfer Foam, 2.5x2.5 (in/in) Discharge Instruction: Apply  Hydrofera Blue Ready to wound bed as directed Secondary Dressing T Adhesive [employer], 4x4 (in/in) elfa Discharge Instruction: Apply over dressing to secure in place. Secured With [employer] ([id number]) [id number] Page 7 of 7 Compression Stockings Add-Ons Electronic Signature(s) Signed: 09/09/2022 1:09:18 PM By: Carlene Coria RN Entered By: Carlene Coria on 09/08/2022 12:48:26 -------------------------------------------------------------------------------- Vitals Details Patient Name: Date of Service: Lynann Bologna, Greggory Brandy MES F. 09/08/2022 12:30 PM Medical Record Number: [id number] Patient Account Number: [id number] Date of Birth/Sex: Treating RN: Dec 27, 1955 (67 y.o. Larry Callahan) Carlene Coria Primary Care Rylee Nuzum: [name] Other Clinician: Referring Elianie Hubers: Treating Rodel Glaspy/Extender: Phillips Climes Weeks in Treatment: 3 Vital Signs Time Taken: 12:42 Temperature (F): 98.7 Height (in): 71 Pulse (bpm): 88 Weight (lbs): 229 Respiratory Rate (breaths/min): 18 Body Mass Index (BMI): 31.9 Blood Pressure (mmHg): 175/78 Reference Range: 80 - 120 mg / dl Electronic Signature(s) Signed: 09/09/2022 1:09:18 PM By: Carlene Coria RN Entered By: Carlene Coria on 09/08/2022 12:43:04

## 2022-09-10 IMAGING — CR DG CHEST 2V
2 series · 2 of 2 positions shown · non-contrast
Comparison: 01/07/2021

CLINICAL DATA: Shortness of breath.

EXAM:
CHEST - 2 VIEW

[chest lat]
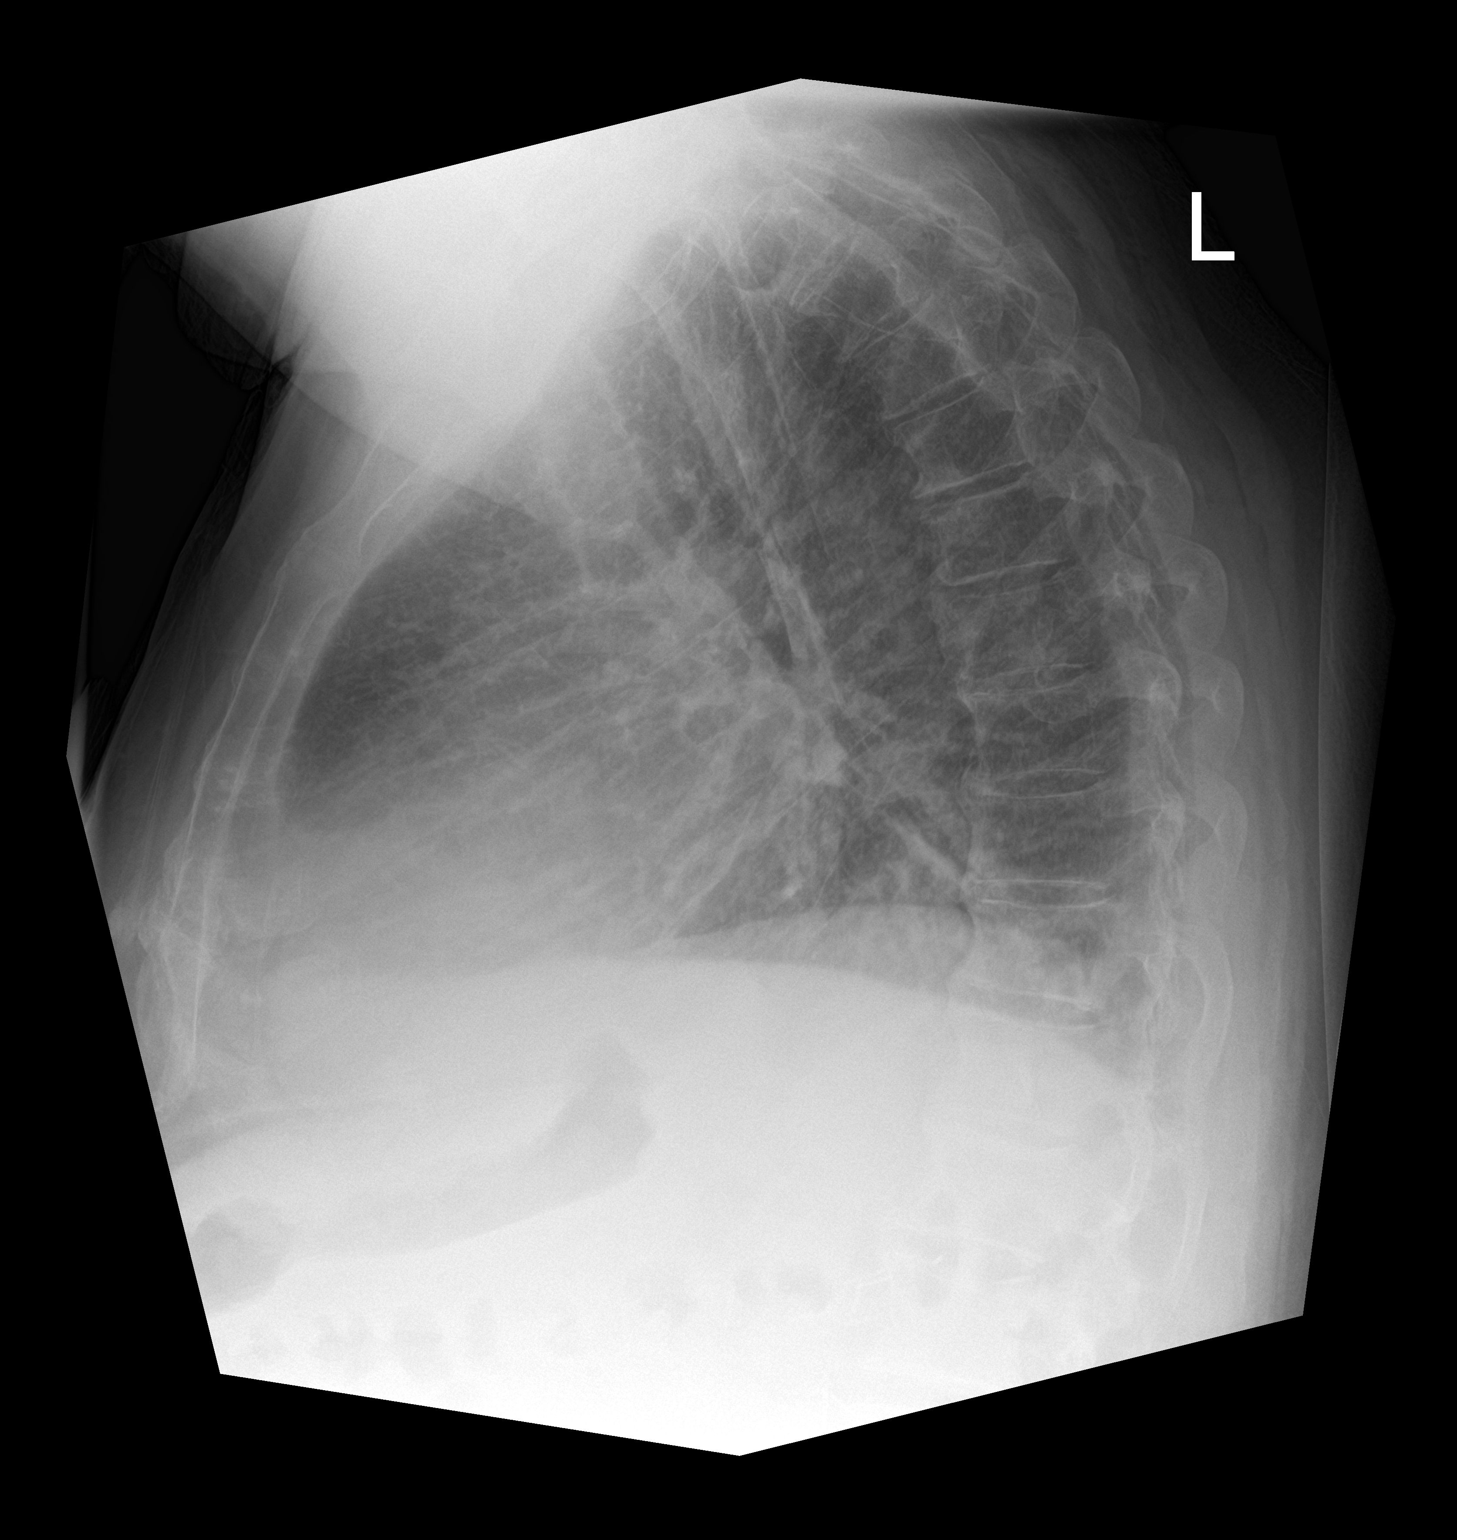

[chest ap]
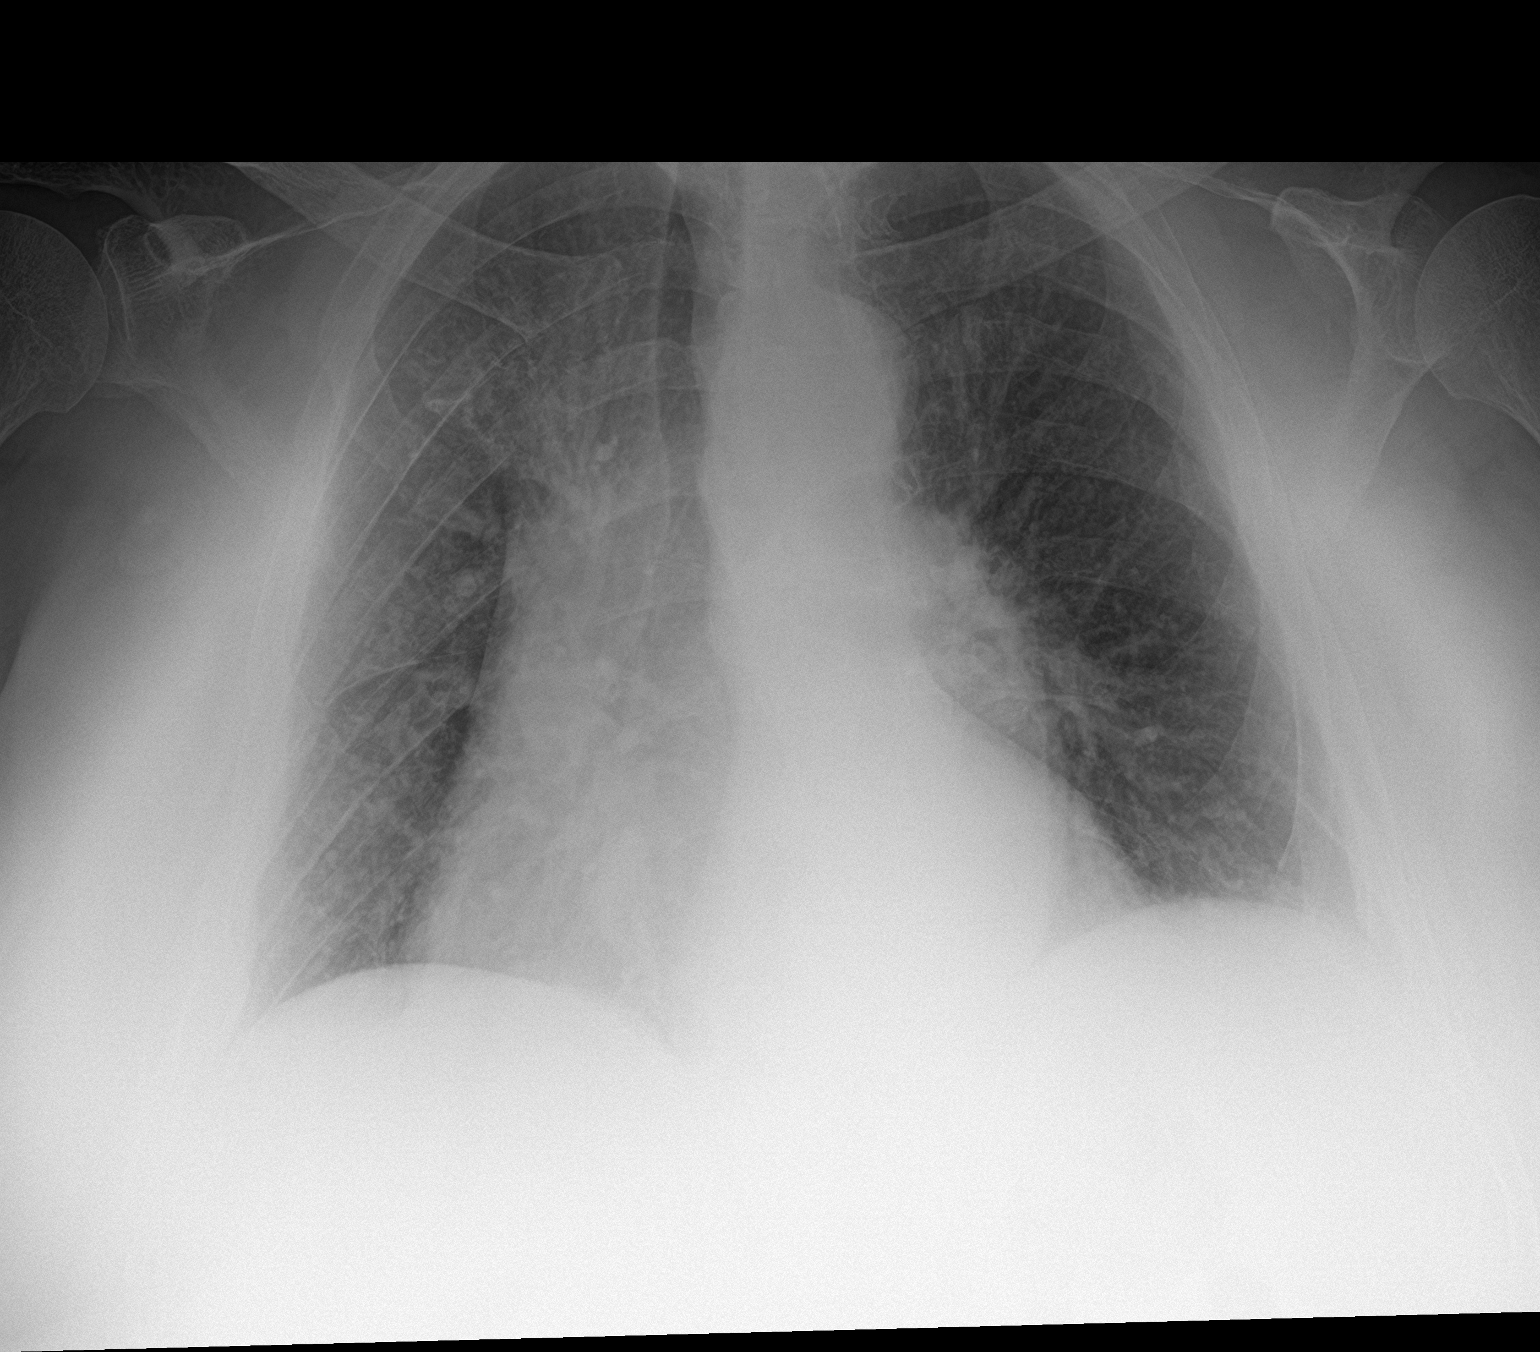

[2 of 2 positions shown; findings below may reference images not displayed]

FINDINGS: Rotational artifact. Mild cardiac enlargement. No pleural effusion.
Diffuse pulmonary vascular congestion. No airspace opacities.
IMPRESSION: Cardiac enlargement and pulmonary vascular congestion.

## 2022-09-10 IMAGING — CT CT ANGIO CHEST
2 of 6 series · 17 of 46 positions shown · IV contrast (APPLIED)
Comparison: CT chest 01/07/2021

CLINICAL DATA: right side rib pain. Pt denies any known injuries
and it has been hurting since [REDACTED]. Pt's social workers is the one
who asked him to come up here. PT denies any recent cough. Pt has
recent dx of a-fib.

EXAM:
CT ANGIOGRAPHY CHEST WITH CONTRAST
TECHNIQUE: Multidetector CT imaging of the chest was performed using the
standard protocol during bolus administration of intravenous
contrast. Multiplanar CT image reconstructions and MIPs were
obtained to evaluate the vascular anatomy.
CONTRAST:  60mL OMNIPAQUE IOHEXOL 350 MG/ML SOLN

[Series 5: thins · axial · 0.80mm/px · z∈[-339,-86]mm · 14 of 346 slices shown]
[im 15/346  lung]
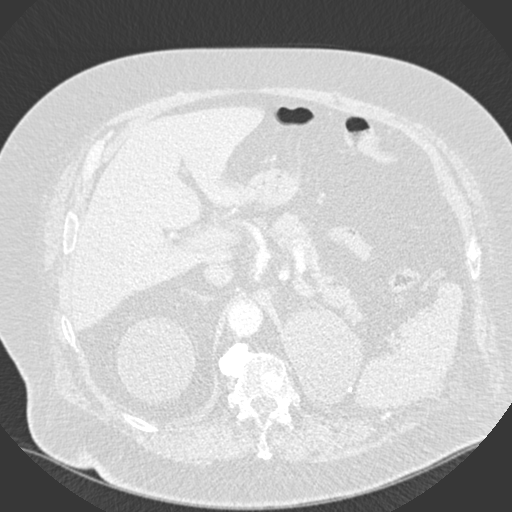
[im 44/346  soft-tissue]
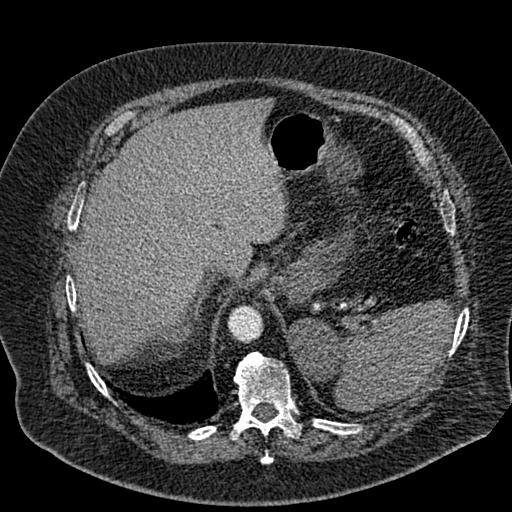
[im 72/346  lung]
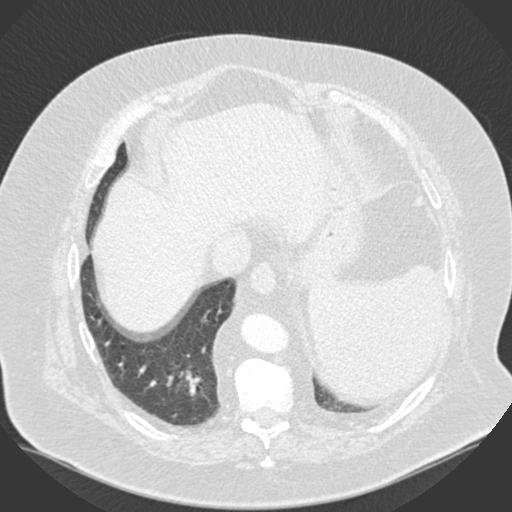
[im 87/346  soft-tissue]
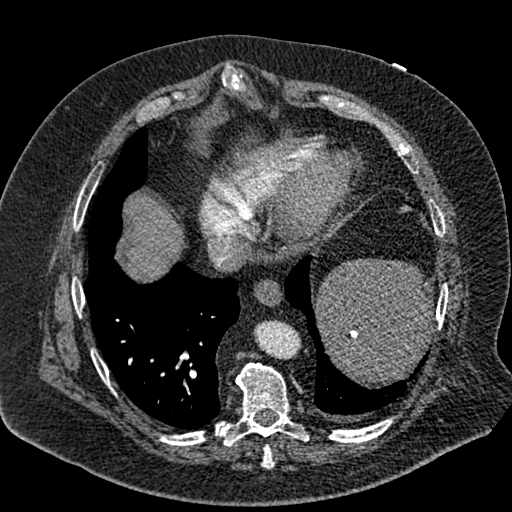
[im 116/346  lung]
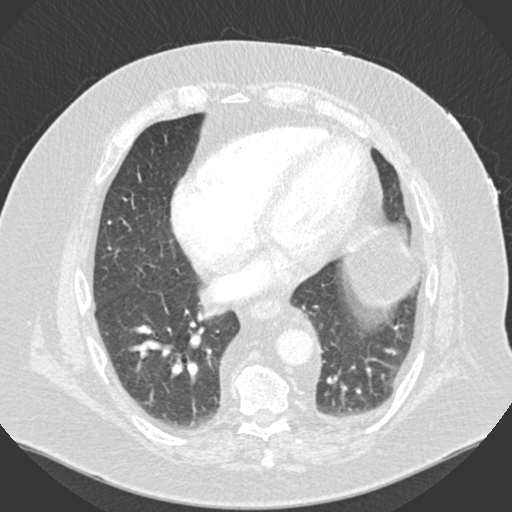
[im 144/346  soft-tissue]
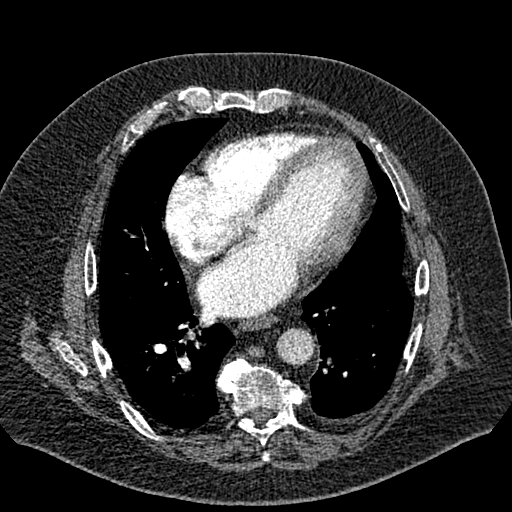
[im 159/346  lung]
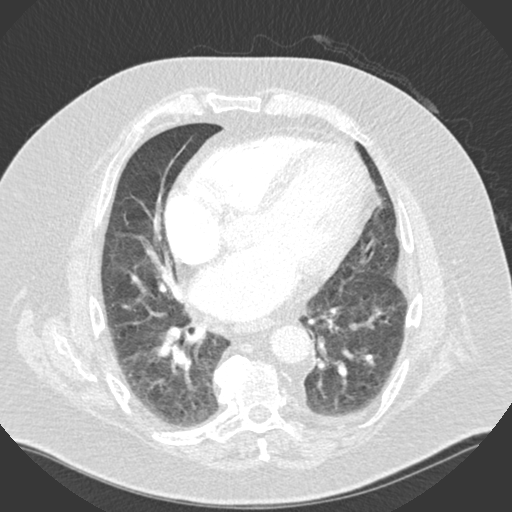
[im 187/346  soft-tissue]
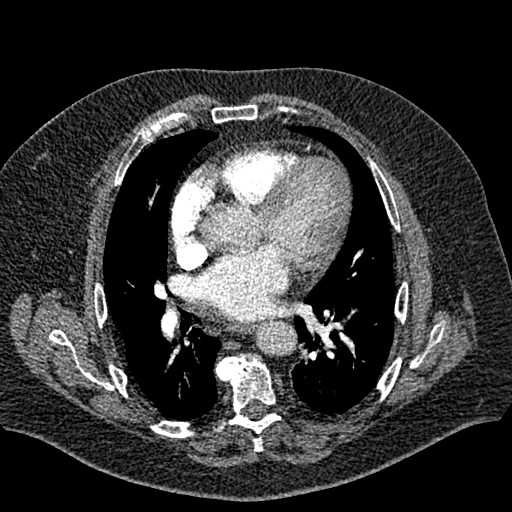
[im 202/346  lung]
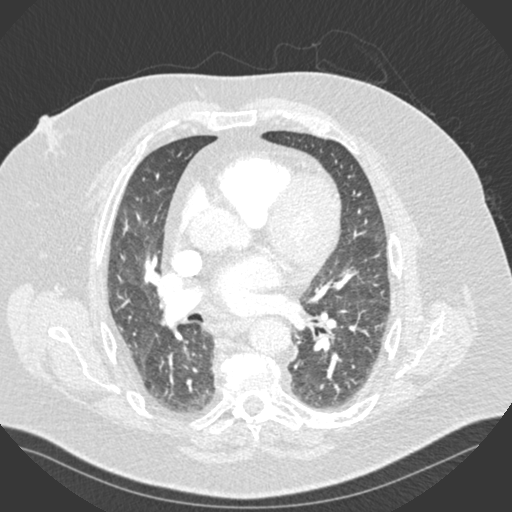
[im 231/346  soft-tissue]
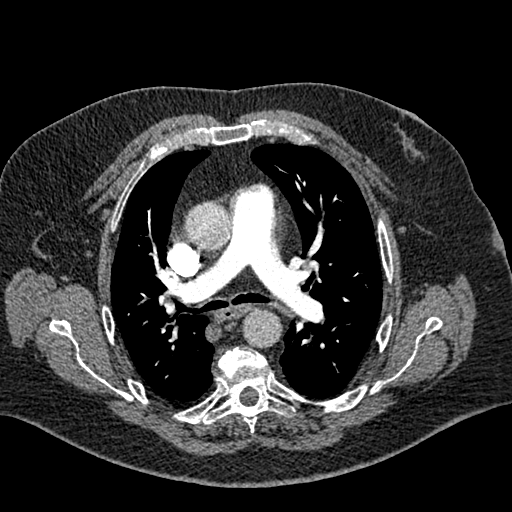
[im 259/346  lung]
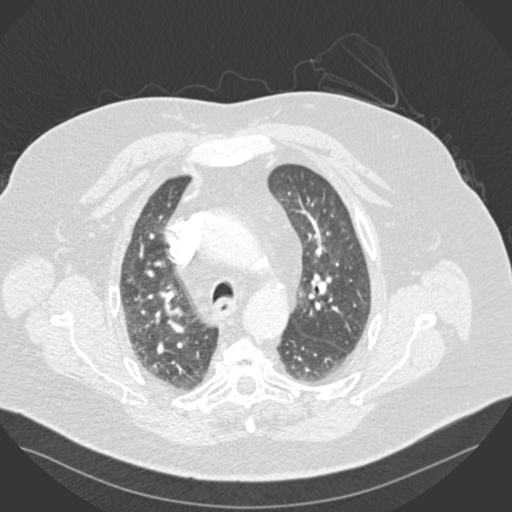
[im 274/346  soft-tissue]
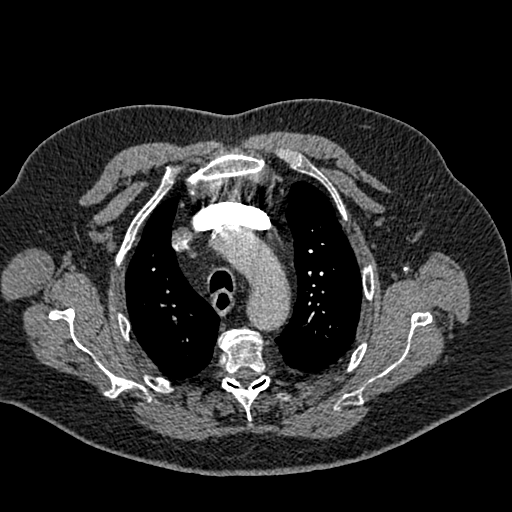
[im 302/346  lung]
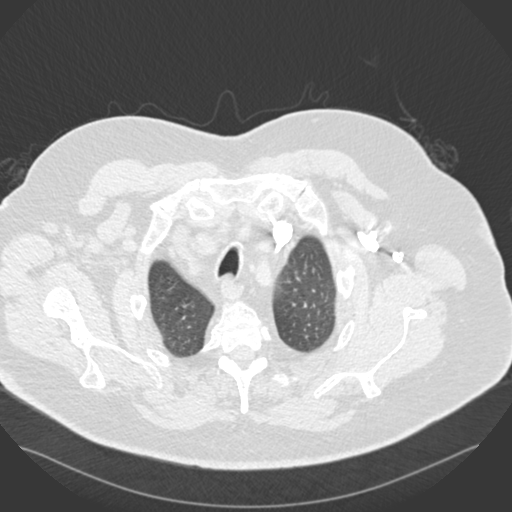
[im 331/346  soft-tissue]
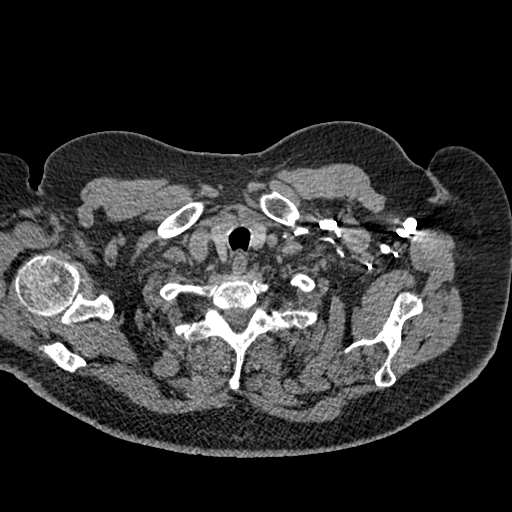

[Series 7: coronal mpr · coronal · 0.60mm/px · 3 of 114 slices shown]
[im 29/114  soft-tissue]
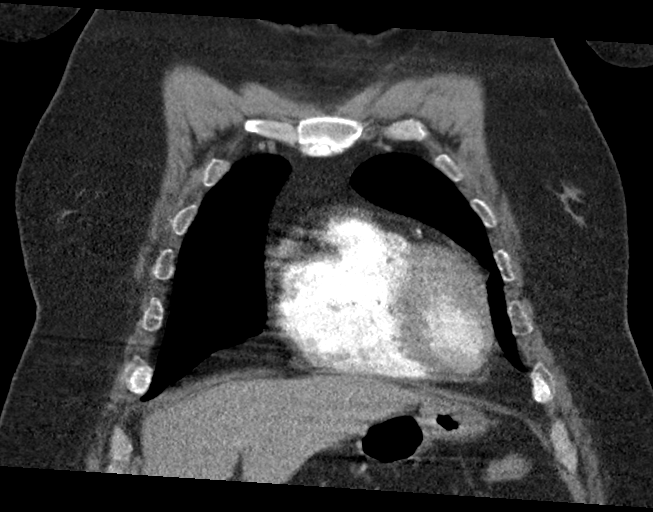
[im 57/114  soft-tissue]
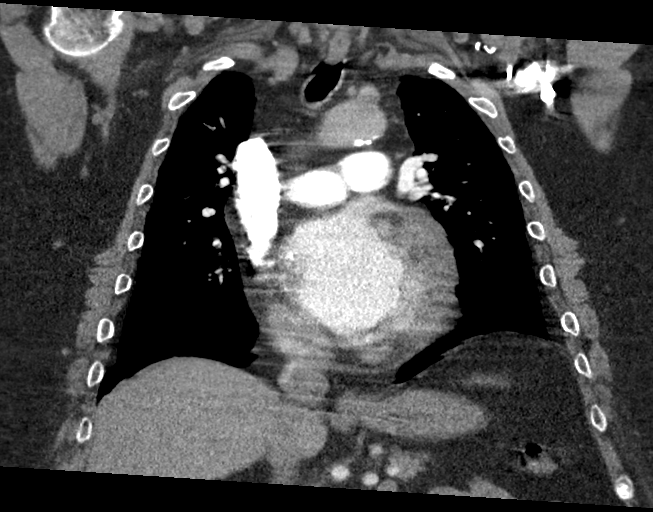
[im 85/114  soft-tissue]
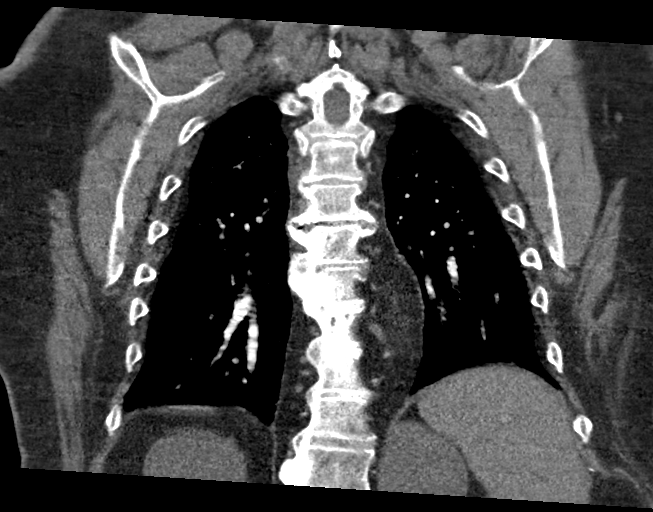

[17 of 46 positions shown; findings below may reference images not displayed]

FINDINGS: Cardiovascular: Satisfactory opacification of the pulmonary arteries
to the segmental level. Limited evaluation of the subsegmental level
due to motion artifact. No evidence of pulmonary embolism. The main
pulmonary artery measures at the upper limits of normal. Enlarged
heart. No significant pericardial effusion. The thoracic aorta is
normal in caliber. Mild atherosclerotic plaque of the thoracic
aorta. Left anterior descending coronary artery calcifications.

Mediastinum/Nodes: No enlarged mediastinal, hilar, or axillary lymph
nodes. Thyroid gland, trachea, and esophagus demonstrate no
significant findings.

Lungs/Pleura: Expiratory phase of respiration. No focal
consolidation. No pulmonary nodule. No pulmonary mass. Interval
decrease in now residual trace left pleural effusion. No right
pleural effusion. No pneumothorax.

Upper Abdomen: Partially visualized fluid density within the right
kidney. There is a partially visualized known left adrenal gland
fluid density lesion with an associated 2 mm calcification. Findings
are better evaluated on CT abdomen 11/30/2020. Punctate splenic
calcifications likely sequelae of prior granulomatous disease.

Musculoskeletal:

No chest wall abnormality.

No suspicious lytic or blastic osseous lesions. No acute displaced
fracture. Multilevel degenerative changes of the spine.

Review of the MIP images confirms the above findings.
IMPRESSION: 1. No central or segment pulmonary embolus.
2. Interval decrease in now residual trace left pleural effusion.
3.  Aortic Atherosclerosis ([id number]).
4. Otherwise no acute intrathoracic abnormality.

## 2022-09-12 ENCOUNTER — Emergency Department: Payer: Medicare PPO

## 2022-09-12 ENCOUNTER — Emergency Department
Admission: EM | Admit: 2022-09-12 | Discharge: 2022-09-12 | Disposition: A | Discharge status: Home / Self Care | Payer: Medicare PPO | Attending: Emergency Medicine | Admitting: Emergency Medicine

## 2022-09-12 ENCOUNTER — Other Ambulatory Visit: Payer: Self-pay

## 2022-09-12 DIAGNOSIS — E119 Type 2 diabetes mellitus without complications: Secondary | ICD-10-CM | POA: Diagnosis not present

## 2022-09-12 DIAGNOSIS — I10 Essential (primary) hypertension: Secondary | ICD-10-CM | POA: Diagnosis not present

## 2022-09-12 DIAGNOSIS — I110 Hypertensive heart disease with heart failure: Secondary | ICD-10-CM | POA: Diagnosis not present

## 2022-09-12 DIAGNOSIS — G4733 Obstructive sleep apnea (adult) (pediatric): Secondary | ICD-10-CM | POA: Diagnosis not present

## 2022-09-12 DIAGNOSIS — R29818 Other symptoms and signs involving the nervous system: Secondary | ICD-10-CM | POA: Diagnosis not present

## 2022-09-12 DIAGNOSIS — R200 Anesthesia of skin: Secondary | ICD-10-CM

## 2022-09-12 DIAGNOSIS — I130 Hypertensive heart and chronic kidney disease with heart failure and stage 1 through stage 4 chronic kidney disease, or unspecified chronic kidney disease: Secondary | ICD-10-CM | POA: Insufficient documentation

## 2022-09-12 DIAGNOSIS — R202 Paresthesia of skin: Secondary | ICD-10-CM | POA: Diagnosis not present

## 2022-09-12 DIAGNOSIS — I5042 Chronic combined systolic (congestive) and diastolic (congestive) heart failure: Secondary | ICD-10-CM | POA: Insufficient documentation

## 2022-09-12 DIAGNOSIS — J449 Chronic obstructive pulmonary disease, unspecified: Secondary | ICD-10-CM | POA: Diagnosis not present

## 2022-09-12 DIAGNOSIS — N1832 Chronic kidney disease, stage 3b: Secondary | ICD-10-CM | POA: Insufficient documentation

## 2022-09-12 DIAGNOSIS — I509 Heart failure, unspecified: Secondary | ICD-10-CM | POA: Diagnosis not present

## 2022-09-12 DIAGNOSIS — Z602 Problems related to living alone: Secondary | ICD-10-CM | POA: Diagnosis not present

## 2022-09-12 DIAGNOSIS — R29810 Facial weakness: Secondary | ICD-10-CM | POA: Diagnosis not present

## 2022-09-12 DIAGNOSIS — R519 Headache, unspecified: Secondary | ICD-10-CM | POA: Diagnosis not present

## 2022-09-12 DIAGNOSIS — R4781 Slurred speech: Secondary | ICD-10-CM | POA: Diagnosis not present

## 2022-09-12 DIAGNOSIS — I2510 Atherosclerotic heart disease of native coronary artery without angina pectoris: Secondary | ICD-10-CM | POA: Diagnosis not present

## 2022-09-12 DIAGNOSIS — L0231 Cutaneous abscess of buttock: Secondary | ICD-10-CM | POA: Diagnosis not present

## 2022-09-12 DIAGNOSIS — E785 Hyperlipidemia, unspecified: Secondary | ICD-10-CM | POA: Diagnosis not present

## 2022-09-12 LAB — CBC
HCT: 30.6 % — ABNORMAL LOW (ref 39.0–52.0)
Hemoglobin: 9.7 g/dL — ABNORMAL LOW (ref 13.0–17.0)
MCH: 27.7 pg (ref 26.0–34.0)
MCHC: 31.7 g/dL (ref 30.0–36.0)
MCV: 87.4 fL (ref 80.0–100.0)
Platelets: 177 10*3/uL (ref 150–400)
RBC: 3.5 MIL/uL — ABNORMAL LOW (ref 4.22–5.81)
RDW: 15.9 % — ABNORMAL HIGH (ref 11.5–15.5)
WBC: 4.7 10*3/uL (ref 4.0–10.5)
nRBC: 0 % (ref 0.0–0.2)

## 2022-09-12 LAB — COMPREHENSIVE METABOLIC PANEL
ALT: 18 U/L (ref 0–44)
AST: 26 U/L (ref 15–41)
Albumin: 2.5 g/dL — ABNORMAL LOW (ref 3.5–5.0)
Alkaline Phosphatase: 68 U/L (ref 38–126)
Anion gap: 9 (ref 5–15)
BUN: 71 mg/dL — ABNORMAL HIGH (ref 8–23)
CO2: 16 mmol/L — ABNORMAL LOW (ref 22–32)
Calcium: 8.2 mg/dL — ABNORMAL LOW (ref 8.9–10.3)
Chloride: 110 mmol/L (ref 98–111)
Creatinine, Ser: 2.79 mg/dL — ABNORMAL HIGH (ref 0.61–1.24)
GFR, Estimated: 24 mL/min — ABNORMAL LOW (ref >60)
Glucose, Bld: 113 mg/dL — ABNORMAL HIGH (ref 70–99)
Potassium: 4.1 mmol/L (ref 3.5–5.1)
Sodium: 135 mmol/L (ref 135–145)
Total Bilirubin: 0.8 mg/dL (ref 0.3–1.2)
Total Protein: 6.9 g/dL (ref 6.5–8.1)

## 2022-09-12 LAB — DIFFERENTIAL
Abs Immature Granulocytes: 0.02 10*3/uL (ref 0.00–0.07)
Basophils Absolute: 0 10*3/uL (ref 0.0–0.1)
Basophils Relative: 1 %
Eosinophils Absolute: 0.1 10*3/uL (ref 0.0–0.5)
Eosinophils Relative: 1 %
Immature Granulocytes: 0 %
Lymphocytes Relative: 21 %
Lymphs Abs: 1 10*3/uL (ref 0.7–4.0)
Monocytes Absolute: 0.3 10*3/uL (ref 0.1–1.0)
Monocytes Relative: 6 %
Neutro Abs: 3.3 10*3/uL (ref 1.7–7.7)
Neutrophils Relative %: 71 %

## 2022-09-12 LAB — TROPONIN I (HIGH SENSITIVITY): Troponin I (High Sensitivity): 11 ng/L (ref <18)

## 2022-09-12 LAB — PROTIME-INR
INR: 1.4 — ABNORMAL HIGH (ref 0.8–1.2)
Prothrombin Time: 17.4 seconds — ABNORMAL HIGH (ref 11.4–15.2)

## 2022-09-12 LAB — CBG MONITORING, ED: Glucose-Capillary: 97 mg/dL (ref 70–99)

## 2022-09-12 LAB — APTT: aPTT: 52 seconds — ABNORMAL HIGH (ref 24–36)

## 2022-09-12 MED ORDER — LORAZEPAM 2 MG/ML IJ SOLN: 2.0000 mg | Freq: Once | INTRAMUSCULAR | Status: AC

## 2022-09-12 MED ORDER — LORAZEPAM 2 MG/ML IJ SOLN
INTRAMUSCULAR | Status: AC
  Administered 2022-09-12: 2 mg via INTRAVENOUS
  Filled 2022-09-12: qty 1

## 2022-09-12 MED ORDER — ACETAMINOPHEN 500 MG PO TABS
1000.0000 mg | ORAL_TABLET | Freq: Once | ORAL | Status: AC
  Administered 2022-09-12: 1000 mg via ORAL
  Filled 2022-09-12: qty 2

## 2022-09-12 MED ORDER — METOCLOPRAMIDE HCL 5 MG/ML IJ SOLN
10.0000 mg | Freq: Once | INTRAMUSCULAR | Status: AC
  Administered 2022-09-12: 10 mg via INTRAVENOUS
  Filled 2022-09-12: qty 2

## 2022-09-12 MED ORDER — SODIUM CHLORIDE 0.9% FLUSH
3.0000 mL | Freq: Once | INTRAVENOUS | Status: AC
  Administered 2022-09-12: 3 mL via INTRAVENOUS

## 2022-09-12 MED ORDER — METOPROLOL TARTRATE 5 MG/5ML IV SOLN
5.0000 mg | Freq: Once | INTRAVENOUS | Status: AC
  Administered 2022-09-12: 5 mg via INTRAVENOUS
  Filled 2022-09-12: qty 5

## 2022-09-12 MED ORDER — HYDROMORPHONE HCL 1 MG/ML IJ SOLN
1.0000 mg | Freq: Once | INTRAMUSCULAR | Status: AC
  Administered 2022-09-12: 1 mg via INTRAVENOUS
  Filled 2022-09-12: qty 1

## 2022-09-12 NOTE — Consult Note (Addendum)
1726: Code stroke cart activated at this time  LKWT 0700 per the patient at time of arrival. Pt with symptoms of L arm numbness and left facial droop, +chills.  MRS 2   1729: Neuro present at time of patient arrival and assessing patient. Neurology running code stroke at this time. Requested that staff pre-pull TNK at this time: No Out of TNK window 1729: Verbal and visual confirmation of Neuro Rory Percy on site. Pt report and history obtained by EMS and patient at the bedside. 1734: Patient sent to CT 1741:EDP signed up for patient.  1802: off the call at this time after secure message to [facility]. Dr. Rory Percy no longer needs TSRN at this time.

## 2022-09-12 NOTE — ED Provider Notes (Signed)
[facility] Provider Note    Event Date/Time   First MD Initiated Contact with Patient 09/12/22 1755     (approximate)   History   Code Stroke   HPI  Larry Callahan is a 67 y.o. male medical history of chronic heart failure, atrial flutterm [place], GERD, obesity who presents because of left arm numbness.  Patient is a poor historian and has difficulty telling exactly when symptoms started.  Initial report was that he was feeling well at 230 and then developed left arm numbness.  Patient says he has had been having off-and-on numbness in the left arm all day after waking around 7 AM.  Also endorses pain in the shoulder and arm.  Numbness involves the whole arm and all 5 digits of the hand.  EMS notes that someone he was in a store with him was concerned about a left facial droop and so EMS was called.  He is also endorsing a migraine headaches this is the first day he has had headache like this.  Denies chest pain nausea vomiting dyspnea but did tell neurology that he was having chest pain.  Denies neck pain.  No numbness or tingling in the legs.     Past Medical History:  Diagnosis Date   Allergy    Atrial flutter ([place])    a. Dx 12/2020--CHA2DS2VASc = 5-->eliquis.   Chronic combined systolic (congestive) and diastolic (congestive) heart failure ([place])    a. 04/2019 Echo: EF 45-50%; b. 02/2019 Echo: EF 55-60%; c. 09/2020 Echo: EF 35-40%, glob HK. Nl RV size/fxn. Mild MR; d. 07/2021 Echo: EF 40-45%, mild LVH, low-nl RV fxn, no MR.   CKD (chronic kidney disease), stage III (HCC)    COPD (chronic obstructive pulmonary disease) ([place])    Coronary artery disease    a. 2011 Cath: nonobs dzs; b. 09/2020 MV: EF 38%. No signif ischemia. ? inf MI vs diaph attenuation. GI uptake artifact.   Diabetes mellitus without complication (HCC)    GERD (gastroesophageal reflux disease)    History of CVA (cerebrovascular accident) 09/25/2020   Hyperlipidemia    Hypertension     Morbid (severe) obesity due to excess calories ([place]) 06/09/2017   Morbid obesity ([place])    NICM (nonischemic cardiomyopathy) ([place])    a. 04/2019 Echo: EF 45-50%; b. 02/2019 Echo: EF 55-60%; c. 09/2020 Echo: EF 35-40%, glob HK; d. 09/2020 MV: No ischemia. ? inf infarct vs attenuation; d. 07/2021 Echo: EF 40-45%.   OSA (obstructive sleep apnea)    Pneumonia due to COVID-19 virus 01/30/2020   Stage 3b chronic kidney disease ([place])    Syncope and collapse 11/16/2020    Patient Active Problem List   Diagnosis Date Noted   Acute upper GI bleed 07/31/2022   Chest pain 07/31/2022   Hematoma of left flank 07/31/2022   Acute on chronic anemia 07/31/2022   Renal mass 07/31/2022   Upper GI bleed    Dizziness and giddiness [id number]   Umbilical hernia [id number]   Acute on chronic systolic CHF (congestive heart failure) 08/05/2021   Acquired thrombophilia 06/28/2021   Anemia of chronic kidney failure 06/28/2021   PAD (peripheral artery disease)    Cellulitis of right arm 03/02/2021   Atrial flutter 01/07/2021   Demand ischemia    Influenza A 10/30/2020   HLD (hyperlipidemia) 10/06/2020   Gastroesophageal reflux disease without esophagitis 04/19/2020   Elevated troponin I level 01/29/2020   Hematemesis 01/09/2020   Hospital discharge follow-up 11/26/2019  Obesity (BMI 30-39.9) 08/08/2019   Acute right hip pain 06/17/2019   Inability to ambulate due to hip 06/17/2019   CKD (chronic kidney disease), stage IV    Hypervolemia    Full code status 05/20/2019   Pleuritic chest pain    Acute kidney injury superimposed on CKD 3b ([place]) 05/09/2019   Acute on chronic heart failure with preserved ejection fraction (HFpEF)    Chronic systolic CHF (congestive heart failure) 03/01/2019   Iron deficiency anemia 12/31/2018   Atopic dermatitis 11/24/2018   Screening for colon cancer 11/06/2017   Type 2 diabetes mellitus 11/06/2017   Hidradenitis 06/09/2017   Atherosclerotic heart disease of native coronary  artery without angina pectoris 06/09/2017   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   Obstructive sleep apnea, adult 06/09/2017   Cigarette nicotine dependence with nicotine-induced disorder 06/09/2017   Diabetic polyneuropathy associated with type 2 diabetes mellitus 06/09/2017   Allergic rhinitis due to pollen 06/09/2017   Mixed hyperlipidemia 06/09/2017   COPD (chronic obstructive pulmonary disease) 06/09/2017   Wheezing 06/09/2017   Dysuria 06/09/2017   Essential hypertension 06/09/2017   Personal history of other malignant neoplasm of kidney 06/09/2017   Pain in right hip 06/09/2017   Secondary hyperparathyroidism, not elsewhere classified 06/09/2017   Tinea corporis 06/09/2017     Physical Exam  Triage Vital Signs: ED Triage Vitals  Enc Vitals Group     BP --      Pulse Rate 09/12/22 1815 (!) 111     Resp 09/12/22 1815 (!) 22     Temp --      Temp src --      SpO2 09/12/22 1815 100 %     Weight 09/12/22 1814 220 lb (99.8 kg)     Height --      Head Circumference --      Peak Flow --      Pain Score 09/12/22 1814 0     Pain Loc --      Pain Edu? --      Excl. in [place]? --     Most recent vital signs: Vitals:   09/12/22 2000 09/12/22 2229  BP: 119/76   Pulse: 65   Resp: (!) 26   Temp:  98.8 F (37.1 C)  SpO2: 92%      General: Awake, no distress.  CV:  Good peripheral perfusion.  Resp:  Normal effort.  Abd:  No distention.  Neuro:             Awake, Alert, Oriented x 3  Other:  Aox3, nml speech  PERRL, EOMI, face symmetric, nml tongue movement  5 Out of 5 strength in bilateral upper extremities, patient has decreased effort in bilateral lower extremities, able to lift them off the bed but then drops quickly Subjective decrease sensation on the left arm compared to normal on the right Finger-nose-finger intact BL  Patient has pain with flexion abduction and internal rotation of the left shoulder   ED Results / Procedures /  Treatments  Labs (all labs ordered are listed, but only abnormal results are displayed) Labs Reviewed  PROTIME-INR - Abnormal; Notable for the following components:      Result Value   Prothrombin Time 17.4 (*)    INR 1.4 (*)    All other components within normal limits  APTT - Abnormal; Notable for the following components:   aPTT 52 (*)    All other components within normal limits  CBC -  Abnormal; Notable for the following components:   RBC 3.50 (*)    Hemoglobin 9.7 (*)    HCT 30.6 (*)    RDW 15.9 (*)    All other components within normal limits  COMPREHENSIVE METABOLIC PANEL - Abnormal; Notable for the following components:   CO2 16 (*)    Glucose, Bld 113 (*)    BUN 71 (*)    Creatinine, Ser 2.79 (*)    Calcium 8.2 (*)    Albumin 2.5 (*)    GFR, Estimated 24 (*)    All other components within normal limits  DIFFERENTIAL  ETHANOL  I-STAT CREATININE, ED  CBG MONITORING, ED  TROPONIN I (HIGH SENSITIVITY)     EKG  EKG reviewed interpreted by myself shows atrial flutter tachycardic,   RADIOLOGY I reviewed and interpreted the CT scan of the brain which does not show any acute intracranial process    PROCEDURES: Critical Care performed: No  Procedures  The patient is on the cardiac monitor to evaluate for evidence of arrhythmia and/or significant heart rate changes.   MEDICATIONS ORDERED IN ED: Medications  sodium chloride flush (NS) 0.9 % injection 3 mL (3 mLs Intravenous Given 09/12/22 1817)  LORazepam (ATIVAN) injection 2 mg (2 mg Intravenous Given 09/12/22 1753)  HYDROmorphone (DILAUDID) injection 1 mg (1 mg Intravenous Given 09/12/22 1936)  metoCLOPramide (REGLAN) injection 10 mg (10 mg Intravenous Given 09/12/22 1935)  acetaminophen (TYLENOL) tablet 1,000 mg (1,000 mg Oral Given 09/12/22 1936)  metoprolol tartrate (LOPRESSOR) injection 5 mg (5 mg Intravenous Given 09/12/22 1935)     IMPRESSION / MDM / ASSESSMENT AND PLAN / ED COURSE  I reviewed the triage  vital signs and the nursing notes.                              Patient's presentation is most consistent with acute presentation with potential threat to life or bodily function.  Differential diagnosis includes, but is not limited to, CVA, intracranial hemorrhage, complicated migraine, functional disorder, peripheral neuropathy, impingement syndrome, rotator cuff injury  The patient is a 67 year old male who presents today with left arm numbness.  Stroke alert was called by EMS as there was some concern for facial droop and the onset of left numbness was somewhat unclear.  On arrival to ED patient is difficult to get an exact time and history from.  Initially says symptoms started at 230 and later says that they started when he woke up this has been coming and going and has been more than just today that he had symptoms also endorses arm pain.  He is denying any leg symptoms he denied any chest pain to me but did tell neurologist that he was having chest pain.  Also complaining of headache.  CT of the head did not show any bleeding.  Patient was taken for MRI which was negative for stroke and this was while he was having symptoms.  On my exam patient is mildly tachycardic in a flutter rest of his vitals are reassuring.  On my neurologic exam he has symmetric face without droop or dysarthria.  He does endorse left arm numbness this involves the whole hand and arm.  Seems to have effort dependent decree strength in both lower extremities but normal strength in the upper extremities.  I agree that his exam is not consistent with a central process.  Could be a cervical radiculopathy.  I do also appreciate pain  with flexion and abduction of the shoulder which suggests an impingement syndrome.  Did obtain basic labs and troponin given the complaint at one time of chest pain.  Troponin is negative and EKG does not suggest ischemia.  Patient was also complaining of headache and given concern for possible  complicated migraine I did give Dilaudid and Reglan.  Patient is on Eliquis and want avoid NSAIDs.  Ultimately with the negative imaging and neurologic symptoms that are not consistent with stroke I think that he can safely be discharged.  We discussed following up with neurology for the arm paresthesia.      FINAL CLINICAL IMPRESSION(S) / ED DIAGNOSES   Final diagnoses:  Left arm numbness     Rx / DC Orders   ED Discharge Orders     None        Note:  This document was prepared using Dragon voice recognition software and may include unintentional dictation errors.   Rada Hay, MD 09/12/22 2233

## 2022-09-12 NOTE — Consult Note (Signed)
Neurology Consultation  Reason for Consult: Left-sided weakness Referring Physician: Dr. Darden Dates  CC: Left-sided facial droop, slurred speech  History is obtained from: Chart, patient  HPI: Larry Callahan is a 67 y.o. male past medical history of atrial fibrillation on Eliquis, diabetes, coronary artery disease, potential, hyperlipidemia, COPD, former smoker quit 2 years ago, brought in by EMS for concern for strokelike symptoms. Initial report last known well 2:30 PM but patient is an extremely poor historian and I could not reliably ascertain the last known well of 2:30 PM with him. Patient reports that he had started noticing some left arm numbness when he woke up this morning or shortly thereafter which was around 7 AM.  He then went to some stores with his sister and a friend of her sisters.  In the store, this other person noticed that his left face probably was showing some drooping and called EMS.  EMS was concerned initially for left facial droop.  He also complained of left arm numbness-which she says sometimes comes and goes but this time around had been persistent.  He cannot reliably tell me whether his symptoms have been going on since 7 this morning or 2:30 PM or sometimes in between. His examination also was very inconsistent. After the noncontrast head CT showed no acute changes, given his deranged renal function from lab test few weeks ago, I decided to take him straight for an MRI to get a sense of whether he has a stroke or not since my suspicion for stroke was somewhat on the lower side but I did not want to rely solely on clinical judgment and not do complete due diligence.  Reports of a headache.  Also blood pressures were systolic 1 [id number] for EMS.   LKW: Unclear-either 7 AM or 2:30 PM-patient cannot reliably discern IV thrombolysis given?: no, unclear last known well, inconsistent examination EVT: Exam not consistent with LVO Premorbid modified Rankin scale (mRS):  2-mainly due to pain and other issues   ROS: Full ROS was performed and is negative except as noted in the HPI.   Past Medical History:  Diagnosis Date   Allergy    Atrial flutter ([place])    a. Dx 12/2020--CHA2DS2VASc = 5-->eliquis.   Chronic combined systolic (congestive) and diastolic (congestive) heart failure ([place])    a. 04/2019 Echo: EF 45-50%; b. 02/2019 Echo: EF 55-60%; c. 09/2020 Echo: EF 35-40%, glob HK. Nl RV size/fxn. Mild MR; d. 07/2021 Echo: EF 40-45%, mild LVH, low-nl RV fxn, no MR.   CKD (chronic kidney disease), stage III (HCC)    COPD (chronic obstructive pulmonary disease) ([place])    Coronary artery disease    a. 2011 Cath: nonobs dzs; b. 09/2020 MV: EF 38%. No signif ischemia. ? inf MI vs diaph attenuation. GI uptake artifact.   Diabetes mellitus without complication (HCC)    GERD (gastroesophageal reflux disease)    History of CVA (cerebrovascular accident) 09/25/2020   Hyperlipidemia    Hypertension    Morbid (severe) obesity due to excess calories ([place]) 06/09/2017   Morbid obesity ([place])    NICM (nonischemic cardiomyopathy) ([place])    a. 04/2019 Echo: EF 45-50%; b. 02/2019 Echo: EF 55-60%; c. 09/2020 Echo: EF 35-40%, glob HK; d. 09/2020 MV: No ischemia. ? inf infarct vs attenuation; d. 07/2021 Echo: EF 40-45%.   OSA (obstructive sleep apnea)    Pneumonia due to COVID-19 virus 01/30/2020   Stage 3b chronic kidney disease ([place])    Syncope and collapse 11/16/2020  Family History  Problem Relation Age of Onset   Diabetes Mother        2003   Lung cancer Father    Diabetes Sister    Hypertension Sister    Heart disease Sister    Cancer Sister    Bone cancer Brother    Prostate cancer Neg Hx    Bladder Cancer Neg Hx    Kidney cancer Neg Hx      Social History:   reports that he quit smoking about 2 years ago. His smoking use included cigarettes. He has quit using smokeless tobacco.  His smokeless tobacco use included chew. He reports that he does not drink alcohol and  does not use drugs.  Medications  Current Facility-Administered Medications:    sodium chloride flush (NS) 0.9 % injection 3 mL, 3 mL, Intravenous, Once, Starleen Blue, Jennette Dubin, MD  Current Outpatient Medications:    Accu-Chek Softclix Lancets lancets, Use to check blood sugars 4 times a day   E11.65, Disp: 400 each, Rfl: 3   acetaminophen (TYLENOL) 325 MG tablet, Take 2 tablets (650 mg total) by mouth every 6 (six) hours as needed for mild pain or moderate pain (or Fever >/= 101)., Disp: , Rfl:    apixaban (ELIQUIS) 5 MG TABS tablet, Take 1 tablet (5 mg total) by mouth 2 (two) times daily., Disp: 180 tablet, Rfl: 1   ascorbic acid (VITAMIN C) 500 MG tablet, Take 1 tablet (500 mg total) by mouth daily., Disp: 90 tablet, Rfl: 0   Blood Glucose Monitoring Suppl (TRUE METRIX AIR GLUCOSE METER) DEVI, 1 Device by Does not apply route 2 (two) times daily., Disp: 1 Device, Rfl: 0   Budeson-Glycopyrrol-Formoterol (BREZTRI AEROSPHERE) 160-9-4.8 MCG/ACT AERO, Inhale 2 puffs into the lungs 2 (two) times daily. (Patient taking differently: Inhale 2 puffs into the lungs 2 (two) times daily as needed.), Disp: 2 each, Rfl: 3   carvedilol (COREG) 12.5 MG tablet, Take 1 tablet (12.5 mg total) by mouth 2 (two) times daily., Disp: 180 tablet, Rfl: 3   cyanocobalamin 1000 MCG tablet, Take 1 tablet (1,000 mcg total) by mouth daily. (Patient not taking: Reported on 08/31/2022), Disp: 90 tablet, Rfl: 1   doxycycline (VIBRA-TABS) 100 MG tablet, Take 1 tablet (100 mg total) by mouth 2 (two) times daily., Disp: 20 tablet, Rfl: 0   folic acid (FOLVITE) 1 MG tablet, Take 1 tablet (1 mg total) by mouth daily., Disp: 90 tablet, Rfl: 0   furosemide (LASIX) 40 MG tablet, Take 1 tablet (40 mg total) by mouth daily., Disp: 90 tablet, Rfl: 3   glimepiride (AMARYL) 2 MG tablet, Take 1 tablet (2 mg total) by mouth daily., Disp: 30 tablet, Rfl: 5   glucose blood (ACCU-CHEK GUIDE) test strip, Use to check blood sugars 4 times a day    E11.65, Disp: 400 each, Rfl: 3   hydrALAZINE (APRESOLINE) 100 MG tablet, Take 1 tablet (100 mg total) by mouth 3 (three) times daily., Disp: 270 tablet, Rfl: 0   insulin glargine, 2 Unit Dial, (TOUJEO MAX SOLOSTAR) 300 UNIT/ML Solostar Pen, Inject 20 units in am, Disp: 9 mL, Rfl: 3   Insulin Pen Needle (NOVOFINE PEN NEEDLE) 32G X 6 MM MISC, Use as directed with toujeo pen, Disp: 100 each, Rfl: 5   ipratropium-albuterol (DUONEB) 0.5-2.5 (3) MG/3ML SOLN, Take 3 mLs by nebulization every 6 (six) hours as needed., Disp: 270 mL, Rfl: 3   iron polysaccharides (NIFEREX) 150 MG capsule, Take 1 capsule (150 mg total)  by mouth daily. (Patient not taking: Reported on 08/31/2022), Disp: 90 capsule, Rfl: 0   methocarbamol (ROBAXIN) 500 MG tablet, Take 1 tablet (500 mg total) by mouth every 6 (six) hours as needed for muscle spasms. (Patient not taking: Reported on 08/31/2022), Disp: 60 tablet, Rfl: 2   mupirocin ointment (BACTROBAN) 2 %, Apply 1 Application topically daily. To wound on elbow until healed. (Patient not taking: Reported on 08/31/2022), Disp: 30 g, Rfl: 0   omeprazole (PRILOSEC) 40 MG capsule, Take 1 capsule (40 mg total) by mouth daily. Take 1 capsule once in the morning and once in the evening, Disp: 180 capsule, Rfl: 1   potassium chloride SA (KLOR-CON M) 20 MEQ tablet, Take 20 mEq by mouth daily., Disp: , Rfl:    rosuvastatin (CRESTOR) 10 MG tablet, Take 1 tablet (10 mg total) by mouth at bedtime., Disp: 90 tablet, Rfl: 1   Vitamin D, Ergocalciferol, (DRISDOL) 1.25 MG (50000 UNIT) CAPS capsule, Take 1 capsule (50,000 Units total) by mouth every 7 (seven) days., Disp: 12 capsule, Rfl: 0   Exam: Current vital signs: Pulse (!) 111   Resp (!) 22   Wt 99.8 kg   SpO2 100%   BMI 30.68 kg/m  Vital signs in last 24 hours: Pulse Rate:  [111] 111 (04/01 1815) Resp:  [22] 22 (04/01 1815) SpO2:  [100 %] 100 % (04/01 1815) Weight:  [99.8 kg] 99.8 kg (04/01 1814)  General: Awake alert in no  distress HEENT: Normocephalic atraumatic Lungs: Clear Cardiovascular: Regular rhythm Abdomen nondistended nontender Neurological exam He is awake alert oriented x 3 Speech is mildly dysarthric but he is also edentulous-unsure if that is his baseline articulation. No evidence of aphasia Cranial nerves II to XII: Pupils equal round react light, extraocular movements intact, visual fields full, at baseline, he has mild right nasolabial fold flattening again unsure if this is due to edentulousness or something real.  He also keeps twisting his right face down volitionally at times.  I did not see any left facial weakness.  On smiling, his smile is symmetric.  Hearing is intact.  Tongue and palate midline. Motor examination with no drift in bilateral upper extremities but he says his left arm feels numb.  Both lower extremities drift equally and touch the bed before 5 seconds. Sensation diminished on the left in comparison to the right with patchy areas of sensation loss on the left upper and lower extremity in comparison to the right. Coordination examination shows no evidence of dysmetria. NIH stroke scale 1a Level of Conscious.: 0 1b LOC Questions: 0 1c LOC Commands: 0 2 Best Gaze: 0 3 Visual: 0 4 Facial Palsy: 0 5a Motor Arm - left: 0 5b Motor Arm - Right: 0 6a Motor Leg - Left: 1 6b Motor Leg - Right: 1 7 Limb Ataxia: 0 8 Sensory: 2 9 Best Language: 0 10 Dysarthria: 1 11 Extinct. and Inatten.: 0 TOTAL: 5   Labs I have reviewed labs in epic and the results pertinent to this consultation are:  CBC    Component Value Date/Time   WBC 4.7 09/12/2022 1730   RBC 3.50 (L) 09/12/2022 1730   HGB 9.7 (L) 09/12/2022 1730   HGB 11.0 (L) 02/27/2018 0844   HCT 30.6 (L) 09/12/2022 1730   HCT 33.4 (L) 02/27/2018 0844   PLT 177 09/12/2022 1730   PLT 248 02/27/2018 0844   MCV 87.4 09/12/2022 1730   MCV 79 02/27/2018 0844   MCV 90 03/06/2013 [id number]  MCH 27.7 09/12/2022 1730   MCHC 31.7  09/12/2022 1730   RDW 15.9 (H) 09/12/2022 1730   RDW 15.6 (H) 02/27/2018 0844   RDW 13.9 03/06/2013 0621   LYMPHSABS 1.0 09/12/2022 1730   LYMPHSABS 2.2 02/27/2018 0844   LYMPHSABS 1.8 12/08/2012 0539   MONOABS 0.3 09/12/2022 1730   MONOABS 1.0 12/08/2012 0539   EOSABS 0.1 09/12/2022 1730   EOSABS 0.3 02/27/2018 0844   EOSABS 0.2 12/08/2012 0539   BASOSABS 0.0 09/12/2022 1730   BASOSABS 0.1 02/27/2018 0844   BASOSABS 0.1 12/08/2012 0539    CMP     Component Value Date/Time   NA 135 09/12/2022 1730   NA 136 07/15/2021 1151   NA 138 03/06/2013 0621   K 4.1 09/12/2022 1730   K 3.9 03/06/2013 0621   CL 110 09/12/2022 1730   CL 107 03/06/2013 0621   CO2 16 (L) 09/12/2022 1730   CO2 23 03/06/2013 0621   GLUCOSE 113 (H) 09/12/2022 1730   GLUCOSE 157 (H) 03/06/2013 0621   BUN 71 (H) 09/12/2022 1730   BUN 41 (H) 07/15/2021 1151   BUN 19 (H) 03/06/2013 0621   CREATININE 2.79 (H) 09/12/2022 1730   CREATININE 1.01 03/06/2013 0621   CALCIUM 8.2 (L) 09/12/2022 1730   CALCIUM 9.3 03/06/2013 0621   PROT 6.9 09/12/2022 1730   PROT 6.7 07/15/2021 1151   PROT 7.3 03/06/2013 0621   ALBUMIN 2.5 (L) 09/12/2022 1730   ALBUMIN 3.4 (L) 07/15/2021 1151   ALBUMIN 3.4 03/06/2013 0621   AST 26 09/12/2022 1730   AST 22 03/06/2013 0621   ALT 18 09/12/2022 1730   ALT 24 03/06/2013 0621   ALKPHOS 68 09/12/2022 1730   ALKPHOS 99 03/06/2013 0621   BILITOT 0.8 09/12/2022 1730   BILITOT 0.3 07/15/2021 1151   BILITOT 0.4 03/06/2013 0621   GFRNONAA 24 (L) 09/12/2022 1730   GFRNONAA >60 03/06/2013 0621   GFRAA 51 (L) 02/18/2020 1730   GFRAA >60 03/06/2013 [id number]    Imaging I have reviewed the images obtained:  CT-head-no acute changes MRI brain without contrast limited sequences-no evidence of acute stroke.  Assessment:  67 year old past history of atrial fibrillation on Eliquis, diabetes, coronary disease, hypertension, hyperlipidemia, COPD-former smoker quit 2 years ago brought in for  concern for strokelike symptoms-initially reported dysarthria and left facial droop but what I saw was more of right facial volitional drooping.  On examination he has bilateral lower extremity weakness which is pretty symmetric and sensory deficits which are mostly in a patchy pattern on the left side raising concern for nonorganic etiology. Given his poor deranged renal function, CTA was not pursued and he was taken in for straight MRI scanning of the brain which revealed no evidence of acute stroke. Given the fact that he still has the left-sided neurological symptoms, at least since 2:30 PM if not 7 AM, if this were to stroke, I would see changes on MRI which I do not-hence this is not a stroke or a TIA.  Given preceding headache-complex migraine could be in the differentials. His exam has some nonorganic findings raising suspicion for conversion disorder but the chest pain and left-sided numbness could indicate underlying cardiac etiology which I will let the ED discern before making a formal diagnosis of conversion.  Impression: Patchy left-sided sensory loss and right facial droop with reports prior of left facial droop-inconsistent examination less likely to be a stroke as well as the fact that MRI is negative for stroke ruled  out stroke. Not a TIA given the symptoms are still present and the MRI is negative. Complains of a headache, with focal neurological deficits-complex migraine could be considered in differentials Chest pain and left-sided arm numbness-evaluate for underlying ACS.  Defer to ER  Recommendations: At the time, I do not recommend any further neurological workup. Can do a trial of migraine cocktail of your choice, if this is complex migraine, that should help. Management of the chest pain left-sided numbness with an EKG and troponins per the ER. If all of them are unremarkable, I do not see a reason to keep him in from a neurological standpoint but if there is any other  medical reason for admission, decision will be made by the ED. I discussed my plan in detail with Dr. Starleen Blue Inpatient neurology will be available as needed.  Please call with questions.   -- Amie Portland, MD Neurologist [facility] Pager: [phone]

## 2022-09-12 NOTE — ED Triage Notes (Signed)
Pt to ED [facility] from home code stroke. LKW possibly 1415. Pt with left sided facial droop and left arm numbness. Alert and oriented. Pt also states left arm has been numb since waking up at 0700 this am.

## 2022-09-13 ENCOUNTER — Ambulatory Visit (INDEPENDENT_AMBULATORY_CARE_PROVIDER_SITE_OTHER): Payer: Medicare PPO | Admitting: Internal Medicine

## 2022-09-13 ENCOUNTER — Encounter: Payer: Self-pay | Admitting: Internal Medicine

## 2022-09-13 ENCOUNTER — Other Ambulatory Visit: Payer: Self-pay

## 2022-09-13 ENCOUNTER — Encounter: Payer: Self-pay | Admitting: *Deleted

## 2022-09-13 ENCOUNTER — Telehealth: Payer: Self-pay | Admitting: Nurse Practitioner

## 2022-09-13 VITALS — BP 129/80 | HR 70 | Temp 98.3°F | Resp 16 | Ht 71.0 in | Wt 214.0 lb

## 2022-09-13 DIAGNOSIS — I5022 Chronic systolic (congestive) heart failure: Secondary | ICD-10-CM

## 2022-09-13 DIAGNOSIS — E1122 Type 2 diabetes mellitus with diabetic chronic kidney disease: Secondary | ICD-10-CM

## 2022-09-13 DIAGNOSIS — Z7901 Long term (current) use of anticoagulants: Secondary | ICD-10-CM | POA: Insufficient documentation

## 2022-09-13 DIAGNOSIS — I5042 Chronic combined systolic (congestive) and diastolic (congestive) heart failure: Secondary | ICD-10-CM | POA: Insufficient documentation

## 2022-09-13 DIAGNOSIS — T7840XA Allergy, unspecified, initial encounter: Secondary | ICD-10-CM | POA: Diagnosis not present

## 2022-09-13 DIAGNOSIS — Z79899 Other long term (current) drug therapy: Secondary | ICD-10-CM | POA: Insufficient documentation

## 2022-09-13 DIAGNOSIS — I130 Hypertensive heart and chronic kidney disease with heart failure and stage 1 through stage 4 chronic kidney disease, or unspecified chronic kidney disease: Secondary | ICD-10-CM | POA: Diagnosis not present

## 2022-09-13 DIAGNOSIS — J449 Chronic obstructive pulmonary disease, unspecified: Secondary | ICD-10-CM | POA: Diagnosis not present

## 2022-09-13 DIAGNOSIS — I2510 Atherosclerotic heart disease of native coronary artery without angina pectoris: Secondary | ICD-10-CM | POA: Diagnosis not present

## 2022-09-13 DIAGNOSIS — R112 Nausea with vomiting, unspecified: Secondary | ICD-10-CM | POA: Diagnosis not present

## 2022-09-13 DIAGNOSIS — D649 Anemia, unspecified: Secondary | ICD-10-CM | POA: Diagnosis not present

## 2022-09-13 DIAGNOSIS — N1832 Chronic kidney disease, stage 3b: Secondary | ICD-10-CM | POA: Insufficient documentation

## 2022-09-13 DIAGNOSIS — N184 Chronic kidney disease, stage 4 (severe): Secondary | ICD-10-CM | POA: Diagnosis not present

## 2022-09-13 DIAGNOSIS — M25512 Pain in left shoulder: Secondary | ICD-10-CM | POA: Diagnosis not present

## 2022-09-13 DIAGNOSIS — I10 Essential (primary) hypertension: Secondary | ICD-10-CM | POA: Diagnosis not present

## 2022-09-13 DIAGNOSIS — L299 Pruritus, unspecified: Secondary | ICD-10-CM | POA: Diagnosis not present

## 2022-09-13 DIAGNOSIS — R200 Anesthesia of skin: Secondary | ICD-10-CM

## 2022-09-13 DIAGNOSIS — T782XXA Anaphylactic shock, unspecified, initial encounter: Secondary | ICD-10-CM | POA: Diagnosis not present

## 2022-09-13 DIAGNOSIS — R0602 Shortness of breath: Secondary | ICD-10-CM | POA: Diagnosis not present

## 2022-09-13 DIAGNOSIS — Z794 Long term (current) use of insulin: Secondary | ICD-10-CM | POA: Insufficient documentation

## 2022-09-13 DIAGNOSIS — G8929 Other chronic pain: Secondary | ICD-10-CM | POA: Diagnosis not present

## 2022-09-13 LAB — COMPREHENSIVE METABOLIC PANEL
ALT: 19 U/L (ref 0–44)
AST: 27 U/L (ref 15–41)
Albumin: 2.6 g/dL — ABNORMAL LOW (ref 3.5–5.0)
Alkaline Phosphatase: 68 U/L (ref 38–126)
Anion gap: 8 (ref 5–15)
BUN: 82 mg/dL — ABNORMAL HIGH (ref 8–23)
CO2: 16 mmol/L — ABNORMAL LOW (ref 22–32)
Calcium: 8.4 mg/dL — ABNORMAL LOW (ref 8.9–10.3)
Chloride: 112 mmol/L — ABNORMAL HIGH (ref 98–111)
Creatinine, Ser: 2.78 mg/dL — ABNORMAL HIGH (ref 0.61–1.24)
GFR, Estimated: 24 mL/min — ABNORMAL LOW (ref >60)
Glucose, Bld: 130 mg/dL — ABNORMAL HIGH (ref 70–99)
Potassium: 4.5 mmol/L (ref 3.5–5.1)
Sodium: 136 mmol/L (ref 135–145)
Total Bilirubin: 0.4 mg/dL (ref 0.3–1.2)
Total Protein: 6.7 g/dL (ref 6.5–8.1)

## 2022-09-13 LAB — CBC
HCT: 30 % — ABNORMAL LOW (ref 39.0–52.0)
Hemoglobin: 9.5 g/dL — ABNORMAL LOW (ref 13.0–17.0)
MCH: 27.7 pg (ref 26.0–34.0)
MCHC: 31.7 g/dL (ref 30.0–36.0)
MCV: 87.5 fL (ref 80.0–100.0)
Platelets: 194 10*3/uL (ref 150–400)
RBC: 3.43 MIL/uL — ABNORMAL LOW (ref 4.22–5.81)
RDW: 15.9 % — ABNORMAL HIGH (ref 11.5–15.5)
WBC: 4.6 10*3/uL (ref 4.0–10.5)
nRBC: 0 % (ref 0.0–0.2)

## 2022-09-13 MED ORDER — ONDANSETRON 4 MG PO TBDP
4.0000 mg | ORAL_TABLET | Freq: Once | ORAL | Status: AC
  Administered 2022-09-14: 4 mg via ORAL
  Filled 2022-09-13: qty 1

## 2022-09-13 MED ORDER — ACETAMINOPHEN-CODEINE 300-30 MG PO TABS: ORAL_TABLET | ORAL | 0 refills | Status: DC

## 2022-09-13 NOTE — Progress Notes (Signed)
[facility] [address] [place], [place] [address]  Internal MEDICINE  Office Visit Note  Patient Name: Larry Callahan  [id number]  [id number]  Date of Service: 09/27/2022  Chief Complaint  Patient presents with   Follow-up   Diabetes   Gastroesophageal Reflux   Hypertension   Hyperlipidemia    HPI  Pt is seen for routine follow up Pt has problem understanding his health problems due to intellectual delay and learning disability, he goes to ED for any problem because thinks they will save his life  C/O left shoulder pain, will see ortho Pt experiences fluctuation in his blood sugar which bothers him Pt has CKD, was unable to keep his app with nephrology  Other medical problems include CAD, diastolic HF   Current Medication: Outpatient Encounter Medications as of 09/13/2022  Medication Sig Note   Accu-Chek Softclix Lancets lancets Use to check blood sugars 4 times a day   E11.65    acetaminophen (TYLENOL) 325 MG tablet Take 2 tablets (650 mg total) by mouth every 6 (six) hours as needed for mild pain or moderate pain (or Fever >/= 101). 08/31/2022: Pt took one tablet this morning    acetaminophen-codeine (TYLENOL #3) 300-30 MG tablet Take one tab po at night for left shoulder pain    apixaban (ELIQUIS) 5 MG TABS tablet Take 1 tablet (5 mg total) by mouth 2 (two) times daily.    ascorbic acid (VITAMIN C) 500 MG tablet Take 1 tablet (500 mg total) by mouth daily.    Blood Glucose Monitoring Suppl (TRUE METRIX AIR GLUCOSE METER) DEVI 1 Device by Does not apply route 2 (two) times daily.    Budeson-Glycopyrrol-Formoterol (BREZTRI AEROSPHERE) 160-9-4.8 MCG/ACT AERO Inhale 2 puffs into the lungs 2 (two) times daily. (Patient taking differently: Inhale 2 puffs into the lungs 2 (two) times daily as needed.)    carvedilol (COREG) 12.5 MG tablet Take 1 tablet (12.5 mg total) by mouth 2 (two) times daily.    cyanocobalamin 1000 MCG tablet Take 1 tablet (1,000 mcg total)  by mouth daily.    folic acid (FOLVITE) 1 MG tablet Take 1 tablet (1 mg total) by mouth daily. 08/31/2022: Pt could not recall if he took this medication but there was a refill last month    furosemide (LASIX) 40 MG tablet Take 1 tablet (40 mg total) by mouth daily.    glimepiride (AMARYL) 2 MG tablet Take 1 tablet (2 mg total) by mouth daily.    glucose blood (ACCU-CHEK GUIDE) test strip Use to check blood sugars 4 times a day   E11.65    insulin glargine, 2 Unit Dial, (TOUJEO MAX SOLOSTAR) 300 UNIT/ML Solostar Pen Inject 20 units in am    Insulin Pen Needle (NOVOFINE PEN NEEDLE) 32G X 6 MM MISC Use as directed with toujeo pen    ipratropium-albuterol (DUONEB) 0.5-2.5 (3) MG/3ML SOLN Take 3 mLs by nebulization every 6 (six) hours as needed.    omeprazole (PRILOSEC) 40 MG capsule Take 1 capsule (40 mg total) by mouth daily. Take 1 capsule once in the morning and once in the evening    potassium chloride SA (KLOR-CON M) 20 MEQ tablet Take 20 mEq by mouth daily.    [DISCONTINUED] doxycycline (VIBRA-TABS) 100 MG tablet Take 1 tablet (100 mg total) by mouth 2 (two) times daily. (Patient not taking: Reported on 09/23/2022)    [DISCONTINUED] hydrALAZINE (APRESOLINE) 100 MG tablet Take 1 tablet (100 mg total) by mouth 3 (three) times  daily.    [DISCONTINUED] iron polysaccharides (NIFEREX) 150 MG capsule Take 1 capsule (150 mg total) by mouth daily. (Patient not taking: Reported on 09/23/2022)    [DISCONTINUED] methocarbamol (ROBAXIN) 500 MG tablet Take 1 tablet (500 mg total) by mouth every 6 (six) hours as needed for muscle spasms. (Patient not taking: Reported on 09/23/2022)    [DISCONTINUED] mupirocin ointment (BACTROBAN) 2 % Apply 1 Application topically daily. To wound on elbow until healed. (Patient not taking: Reported on 09/23/2022)    [DISCONTINUED] rosuvastatin (CRESTOR) 10 MG tablet Take 1 tablet (10 mg total) by mouth at bedtime.    [DISCONTINUED] Vitamin D, Ergocalciferol, (DRISDOL) 1.25 MG (50000  UNIT) CAPS capsule Take 1 capsule (50,000 Units total) by mouth every 7 (seven) days. (Patient not taking: Reported on 09/23/2022)    No facility-administered encounter medications on file as of 09/13/2022.    Surgical History: Past Surgical History:  Procedure Laterality Date   CARDIAC CATHETERIZATION     CHOLECYSTECTOMY     COLONOSCOPY WITH PROPOFOL N/A 04/12/2019   Procedure: COLONOSCOPY WITH PROPOFOL;  Surgeon: Toney Reil, MD;  Location: [facility] ENDOSCOPY;  Service: Gastroenterology;  Laterality: N/A;   ESOPHAGOGASTRODUODENOSCOPY N/A 04/12/2019   Procedure: ESOPHAGOGASTRODUODENOSCOPY (EGD);  Surgeon: Toney Reil, MD;  Location: [facility] ENDOSCOPY;  Service: Gastroenterology;  Laterality: N/A;   ESOPHAGOGASTRODUODENOSCOPY (EGD) WITH PROPOFOL N/A 11/21/2019   Procedure: ESOPHAGOGASTRODUODENOSCOPY (EGD) WITH PROPOFOL;  Surgeon: Toney Reil, MD;  Location: [facility] ENDOSCOPY;  Service: Gastroenterology;  Laterality: N/A;   ESOPHAGOGASTRODUODENOSCOPY (EGD) WITH PROPOFOL N/A 01/11/2022   Procedure: ESOPHAGOGASTRODUODENOSCOPY (EGD) WITH PROPOFOL;  Surgeon: Midge Minium, MD;  Location: [name] ENDOSCOPY;  Service: Endoscopy;  Laterality: N/A;   ESOPHAGOGASTRODUODENOSCOPY (EGD) WITH PROPOFOL N/A 08/02/2022   Procedure: ESOPHAGOGASTRODUODENOSCOPY (EGD) WITH PROPOFOL;  Surgeon: Midge Minium, MD;  Location: [facility] ENDOSCOPY;  Service: Endoscopy;  Laterality: N/A;   FLEXIBLE BRONCHOSCOPY Bilateral 05/17/2019   Procedure: FLEXIBLE BRONCHOSCOPY;  Surgeon: Yevonne Pax, MD;  Location: [name] ORS;  Service: Pulmonary;  Laterality: Bilateral;   left arm surgery     nephrectomy Left    PARATHYROIDECTOMY     RIGHT HEART CATH N/A 03/29/2019   Procedure: RIGHT HEART CATH;  Surgeon: Antonieta Iba, MD;  Location: [name] INVASIVE CV LAB;  Service: Cardiovascular;  Laterality: N/A;    Medical History: Past Medical History:  Diagnosis Date   Allergy    Atrial flutter    a. Dx 12/2020--CHA2DS2VASc =  5-->eliquis.   Chronic combined systolic (congestive) and diastolic (congestive) heart failure    a. 04/2019 Echo: EF 45-50%; b. 02/2019 Echo: EF 55-60%; c. 09/2020 Echo: EF 35-40%, glob HK. Nl RV size/fxn. Mild MR; d. 07/2021 Echo: EF 40-45%, mild LVH, low-nl RV fxn, no MR.   CKD (chronic kidney disease), stage III    COPD (chronic obstructive pulmonary disease)    Coronary artery disease    a. 2011 Cath: nonobs dzs; b. 09/2020 MV: EF 38%. No signif ischemia. ? inf MI vs diaph attenuation. GI uptake artifact.   Diabetes mellitus without complication    GERD (gastroesophageal reflux disease)    History of CVA (cerebrovascular accident) 09/25/2020   Hyperlipidemia    Hypertension    Morbid (severe) obesity due to excess calories 06/09/2017   Morbid obesity    NICM (nonischemic cardiomyopathy)    a. 04/2019 Echo: EF 45-50%; b. 02/2019 Echo: EF 55-60%; c. 09/2020 Echo: EF 35-40%, glob HK; d. 09/2020 MV: No ischemia. ? inf infarct vs attenuation; d. 07/2021 Echo: EF 40-45%.  OSA (obstructive sleep apnea)    Pneumonia due to COVID-19 virus 01/30/2020   Stage 3b chronic kidney disease    Syncope and collapse 11/16/2020    Family History: Family History  Problem Relation Age of Onset   Diabetes Mother        2003   Lung cancer Father    Diabetes Sister    Hypertension Sister    Heart disease Sister    Cancer Sister    Bone cancer Brother    Prostate cancer Neg Hx    Bladder Cancer Neg Hx    Kidney cancer Neg Hx     Social History   Socioeconomic History   Marital status: Single    Spouse name: Not on file   Number of children: Not on file   Years of education: Not on file   Highest education level: Not on file  Occupational History   Not on file  Tobacco Use   Smoking status: Former    Types: Cigarettes    Quit date: 03/13/2020    Years since quitting: 2.5   Smokeless tobacco: Former    Types: Associate Professor Use: Never used  Substance and Sexual Activity   Alcohol  use: No   Drug use: No   Sexual activity: Not Currently  Other Topics Concern   Not on file  Social History Narrative   Not on file   Social Determinants of Health   Financial Resource Strain: Low Risk  (05/12/2021)   Overall Financial Resource Strain (CARDIA)    Difficulty of Paying Living Expenses: Not very hard  Food Insecurity: No Food Insecurity (08/01/2022)   Hunger Vital Sign    Worried About Running Out of Food in the Last Year: Never true    Ran Out of Food in the Last Year: Never true  Transportation Needs: No Transportation Needs (08/01/2022)   PRAPARE - Administrator, Civil Service (Medical): No    Lack of Transportation (Non-Medical): No  Physical Activity: Not on file  Stress: Not on file  Social Connections: Not on file  Intimate Partner Violence: Not At Risk (08/01/2022)   Humiliation, Afraid, Rape, and Kick questionnaire    Fear of Current or Ex-Partner: No    Emotionally Abused: No    Physically Abused: No    Sexually Abused: No      Review of Systems  Constitutional:  Negative for fatigue and fever.  HENT:  Negative for congestion, mouth sores and postnasal drip.   Respiratory:  Negative for cough.   Cardiovascular:  Negative for chest pain.  Genitourinary:  Negative for flank pain.  Musculoskeletal:  Positive for arthralgias.  Psychiatric/Behavioral: Negative.      Vital Signs: BP 129/80   Pulse 70   Temp 98.3 F (36.8 C)   Resp 16   Ht 5\' 11"  (1.803 m)   Wt 214 lb (97.1 kg)   SpO2 98%   BMI 29.85 kg/m    Physical Exam Constitutional:      Appearance: Normal appearance.  HENT:     Head: Normocephalic and atraumatic.     Nose: Nose normal.     Mouth/Throat:     Mouth: Mucous membranes are moist.     Pharynx: No posterior oropharyngeal erythema.  Eyes:     Extraocular Movements: Extraocular movements intact.     Pupils: Pupils are equal, round, and reactive to light.  Cardiovascular:     Pulses: Normal pulses.  Heart  sounds: Normal heart sounds.  Pulmonary:     Effort: Pulmonary effort is normal.     Breath sounds: Normal breath sounds.  Musculoskeletal:        General: Swelling present.  Neurological:     General: No focal deficit present.     Mental Status: He is alert.  Psychiatric:        Mood and Affect: Mood normal.        Behavior: Behavior normal.        Assessment/Plan: 1. Diabetes mellitus with stage 4 chronic kidney disease Will continue on current dose of insulin, needs to see nephrology  - Ambulatory referral to Nephrology  2. Chronic left shoulder pain Pt has been seen by ortho, might need IAC   3. Chronic CHF  Pt has been followed by Cardiology   General Counseling: Fatima Sanger understanding of the findings of todays visit and agrees with plan of treatment. I have discussed any further diagnostic evaluation that may be needed or ordered today. We also reviewed his medications today. he has been encouraged to call the office with any questions or concerns that should arise related to todays visit.    Orders Placed This Encounter  Procedures   Ambulatory referral to Nephrology    Meds ordered this encounter  Medications   acetaminophen-codeine (TYLENOL #3) 300-30 MG tablet    Sig: Take one tab po at night for left shoulder pain    Dispense:  30 tablet    Refill:  0    Total time spent:35 Minutes Time spent includes review of chart, medications, test results, and follow up plan with the patient.   [place] Controlled Substance Database was reviewed by me.   Dr Lyndon Code Internal medicine

## 2022-09-13 NOTE — ED Triage Notes (Addendum)
Pt brought in via ems from home.  Pt reports taking  1 codeine tablet at 1700 tonight and began vomiting.  No resp distress.  No rash  no itching  pt sleepy

## 2022-09-13 NOTE — Telephone Encounter (Signed)
Awaiting 09/13/22 office notes for Nephrology referral-[name]

## 2022-09-13 NOTE — ED Triage Notes (Signed)
EMS brings pt in from home for c/o allergic rx after taking codeine; c/o N/V since

## 2022-09-14 ENCOUNTER — Emergency Department
Admission: EM | Admit: 2022-09-14 | Discharge: 2022-09-14 | Disposition: A | Discharge status: Home / Self Care | Payer: Medicare PPO | Attending: Emergency Medicine | Admitting: Emergency Medicine

## 2022-09-14 DIAGNOSIS — R112 Nausea with vomiting, unspecified: Secondary | ICD-10-CM

## 2022-09-14 LAB — URINALYSIS, ROUTINE W REFLEX MICROSCOPIC
Bacteria, UA: NONE SEEN
Bilirubin Urine: NEGATIVE
Glucose, UA: NEGATIVE mg/dL
Ketones, ur: NEGATIVE mg/dL
Leukocytes,Ua: NEGATIVE
Nitrite: NEGATIVE
Protein, ur: >=300 mg/dL — AB
Specific Gravity, Urine: 1.007 (ref 1.005–1.030)
pH: 5 (ref 5.0–8.0)

## 2022-09-14 LAB — TROPONIN I (HIGH SENSITIVITY): Troponin I (High Sensitivity): 13 ng/L (ref <18)

## 2022-09-14 LAB — LIPASE, BLOOD: Lipase: 42 U/L (ref 11–51)

## 2022-09-14 MED ORDER — METOCLOPRAMIDE HCL 5 MG/ML IJ SOLN
10.0000 mg | Freq: Once | INTRAMUSCULAR | Status: AC
  Administered 2022-09-14: 10 mg via INTRAVENOUS
  Filled 2022-09-14: qty 2

## 2022-09-14 MED ORDER — ONDANSETRON 4 MG PO TBDP: 4.0000 mg | ORAL_TABLET | Freq: Four times a day (QID) | ORAL | 0 refills | Status: DC | PRN

## 2022-09-14 MED ORDER — ONDANSETRON HCL 4 MG/2ML IJ SOLN
4.0000 mg | Freq: Once | INTRAMUSCULAR | Status: AC
  Administered 2022-09-14: 4 mg via INTRAVENOUS
  Filled 2022-09-14: qty 2

## 2022-09-14 MED ORDER — SODIUM CHLORIDE 0.9 % IV BOLUS (SEPSIS)
500.0000 mL | Freq: Once | INTRAVENOUS | Status: AC
  Administered 2022-09-14: 500 mL via INTRAVENOUS

## 2022-09-14 NOTE — ED Notes (Signed)
Prior to discharge, patient was given water to drink, and crackers to eat.  Patient did not experience any vomiting following the challenge.  Patient had experienced no vomiting prior to the challenge while here at the ED.

## 2022-09-14 NOTE — ED Provider Notes (Signed)
[facility] Provider Note    Event Date/Time   First MD Initiated Contact with Patient 09/14/22 [phone]     (approximate)   History   Allergic Reaction   HPI  Larry Callahan is a 67 y.o. male with history of a flutter, CHF, CAD, COPD, hypertension, diabetes, hyperlipidemia, chronic kidney disease who presents to the emergency department with complaints of nausea and vomiting that started after taking codeine.  He denies any diarrhea, abdominal pain.  States he has seen some streaks of blood in his vomit but no clots and no melena, hematochezia.  He is on Eliquis.   History provided by patient.    Past Medical History:  Diagnosis Date   Allergy    Atrial flutter    a. Dx 12/2020--CHA2DS2VASc = 5-->eliquis.   Chronic combined systolic (congestive) and diastolic (congestive) heart failure    a. 04/2019 Echo: EF 45-50%; b. 02/2019 Echo: EF 55-60%; c. 09/2020 Echo: EF 35-40%, glob HK. Nl RV size/fxn. Mild MR; d. 07/2021 Echo: EF 40-45%, mild LVH, low-nl RV fxn, no MR.   CKD (chronic kidney disease), stage III    COPD (chronic obstructive pulmonary disease)    Coronary artery disease    a. 2011 Cath: nonobs dzs; b. 09/2020 MV: EF 38%. No signif ischemia. ? inf MI vs diaph attenuation. GI uptake artifact.   Diabetes mellitus without complication    GERD (gastroesophageal reflux disease)    History of CVA (cerebrovascular accident) 09/25/2020   Hyperlipidemia    Hypertension    Morbid (severe) obesity due to excess calories 06/09/2017   Morbid obesity    NICM (nonischemic cardiomyopathy)    a. 04/2019 Echo: EF 45-50%; b. 02/2019 Echo: EF 55-60%; c. 09/2020 Echo: EF 35-40%, glob HK; d. 09/2020 MV: No ischemia. ? inf infarct vs attenuation; d. 07/2021 Echo: EF 40-45%.   OSA (obstructive sleep apnea)    Pneumonia due to COVID-19 virus 01/30/2020   Stage 3b chronic kidney disease    Syncope and collapse 11/16/2020    Past Surgical History:  Procedure Laterality  Date   CARDIAC CATHETERIZATION     CHOLECYSTECTOMY     COLONOSCOPY WITH PROPOFOL N/A 04/12/2019   Procedure: COLONOSCOPY WITH PROPOFOL;  Surgeon: [name], MD;  Location: [facility] ENDOSCOPY;  Service: Gastroenterology;  Laterality: N/A;   ESOPHAGOGASTRODUODENOSCOPY N/A 04/12/2019   Procedure: ESOPHAGOGASTRODUODENOSCOPY (EGD);  Surgeon: [name], MD;  Location: [facility] ENDOSCOPY;  Service: Gastroenterology;  Laterality: N/A;   ESOPHAGOGASTRODUODENOSCOPY (EGD) WITH PROPOFOL N/A 11/21/2019   Procedure: ESOPHAGOGASTRODUODENOSCOPY (EGD) WITH PROPOFOL;  Surgeon: [name], MD;  Location: [facility] ENDOSCOPY;  Service: Gastroenterology;  Laterality: N/A;   ESOPHAGOGASTRODUODENOSCOPY (EGD) WITH PROPOFOL N/A 01/11/2022   Procedure: ESOPHAGOGASTRODUODENOSCOPY (EGD) WITH PROPOFOL;  Surgeon: [name], MD;  Location: [name] ENDOSCOPY;  Service: Endoscopy;  Laterality: N/A;   ESOPHAGOGASTRODUODENOSCOPY (EGD) WITH PROPOFOL N/A 08/02/2022   Procedure: ESOPHAGOGASTRODUODENOSCOPY (EGD) WITH PROPOFOL;  Surgeon: [name], MD;  Location: [facility] ENDOSCOPY;  Service: Endoscopy;  Laterality: N/A;   FLEXIBLE BRONCHOSCOPY Bilateral 05/17/2019   Procedure: FLEXIBLE BRONCHOSCOPY;  Surgeon: [name], MD;  Location: [name] ORS;  Service: Pulmonary;  Laterality: Bilateral;   left arm surgery     nephrectomy Left    PARATHYROIDECTOMY     RIGHT HEART CATH N/A 03/29/2019   Procedure: RIGHT HEART CATH;  Surgeon: [name], MD;  Location: [place] [facility];  Service: Cardiovascular;  Laterality: N/A;    MEDICATIONS:  Prior to Admission medications  Medication Sig Start Date End Date Taking? Authorizing Provider  ondansetron (ZOFRAN-ODT) 4 MG disintegrating tablet Take 1 tablet (4 mg total) by mouth every 6 (six) hours as needed for nausea or vomiting. 09/14/22  Yes Bell Cai, Delice Bison, DO  Accu-Chek Softclix Lancets lancets Use to check blood sugars 4 times a day   E11.65 08/20/21   [name], NP  acetaminophen (TYLENOL) 325 MG tablet Take 2 tablets (650 mg total) by mouth every 6 (six) hours as needed for mild pain or moderate pain (or Fever >/= 101). 08/03/22   Val Riles, MD  acetaminophen-codeine (TYLENOL #3) 300-30 MG tablet Take one tab po at night for left shoulder pain 09/13/22   Lavera Guise, MD  apixaban (ELIQUIS) 5 MG TABS tablet Take 1 tablet (5 mg total) by mouth 2 (two) times daily. 08/18/22   [name], MD  ascorbic acid (VITAMIN C) 500 MG tablet Take 1 tablet (500 mg total) by mouth daily. 08/04/22 11/02/22  Val Riles, MD  Blood Glucose Monitoring Suppl (TRUE METRIX AIR GLUCOSE METER) DEVI 1 Device by Does not apply route 2 (two) times daily. 04/13/18   [name], NP  Budeson-Glycopyrrol-Formoterol (BREZTRI AEROSPHERE) 160-9-4.8 MCG/ACT AERO Inhale 2 puffs into the lungs 2 (two) times daily. Patient taking differently: Inhale 2 puffs into the lungs 2 (two) times daily as needed. 03/29/22   [name], NP  carvedilol (COREG) 12.5 MG tablet Take 1 tablet (12.5 mg total) by mouth 2 (two) times daily. 02/25/22   Alisa Graff, FNP  cyanocobalamin 1000 MCG tablet Take 1 tablet (1,000 mcg total) by mouth daily. 07/15/22   [name], NP  doxycycline (VIBRA-TABS) 100 MG tablet Take 1 tablet (100 mg total) by mouth 2 (two) times daily. 08/31/22   Menshew, Dannielle Karvonen, PA-C  folic acid (FOLVITE) 1 MG tablet Take 1 tablet (1 mg total) by mouth daily. 08/04/22 11/02/22  Val Riles, MD  furosemide (LASIX) 40 MG tablet Take 1 tablet (40 mg total) by mouth daily. 09/05/22   Alisa Graff, FNP  glimepiride (AMARYL) 2 MG tablet Take 1 tablet (2 mg total) by mouth daily. 03/29/22   [name], NP  glucose blood (ACCU-CHEK GUIDE) test strip Use to check blood sugars 4 times a day   E11.65 12/13/21   Lavera Guise, MD  hydrALAZINE (APRESOLINE) 100 MG tablet Take 1 tablet (100 mg total) by mouth 3 (three) times daily. 06/14/22   Lavera Guise, MD   insulin glargine, 2 Unit Dial, (TOUJEO MAX SOLOSTAR) 300 UNIT/ML Solostar Pen Inject 20 units in am 03/03/22   McDonough, Si Gaul, PA-C  Insulin Pen Needle (NOVOFINE PEN NEEDLE) 32G X 6 MM MISC Use as directed with toujeo pen 08/15/22   [name], NP  ipratropium-albuterol (DUONEB) 0.5-2.5 (3) MG/3ML SOLN Take 3 mLs by nebulization every 6 (six) hours as needed. 06/29/21   Danford, Suann Larry, MD  iron polysaccharides (NIFEREX) 150 MG capsule Take 1 capsule (150 mg total) by mouth daily. 08/03/22 11/01/22  Val Riles, MD  methocarbamol (ROBAXIN) 500 MG tablet Take 1 tablet (500 mg total) by mouth every 6 (six) hours as needed for muscle spasms. 06/16/22   [name], NP  mupirocin ointment (BACTROBAN) 2 % Apply 1 Application topically daily. To wound on elbow until healed. 07/26/22   [name], NP  omeprazole (PRILOSEC) 40 MG capsule Take 1 capsule (40 mg total) by mouth daily. Take 1 capsule once in the morning and  once in the evening 08/03/22   Val Riles, MD  potassium chloride SA (KLOR-CON M) 20 MEQ tablet Take 20 mEq by mouth daily. 06/23/22   [provider]  rosuvastatin (CRESTOR) 10 MG tablet Take 1 tablet (10 mg total) by mouth at bedtime. 03/29/22   [name], NP  Vitamin D, Ergocalciferol, (DRISDOL) 1.25 MG (50000 UNIT) CAPS capsule Take 1 capsule (50,000 Units total) by mouth every 7 (seven) days. 08/03/22 11/01/22  Val Riles, MD    Physical Exam   Triage Vital Signs: ED Triage Vitals [09/13/22 2311]  Enc Vitals Group     BP (!) 166/93     Pulse Rate (!) 101     Resp 18     Temp 98 F (36.7 C)     Temp Source Oral     SpO2 98 %     Weight 213 lb 13.5 oz (97 kg)     Height 5\' 11"  (1.803 m)     Head Circumference      Peak Flow      Pain Score 0     Pain Loc      Pain Edu?      Excl. in [place]?     Most recent vital signs: Vitals:   09/14/22 0327 09/14/22 0500  BP: (!) 173/86 (!) 176/110  Pulse: (!) 102 66  Resp: 18 18  Temp:  98.1 F (36.7 C)   SpO2: 98% 98%    CONSTITUTIONAL: Alert, responds appropriately to questions.  Obese, chronically ill-appearing, yelling intermittently at staff HEAD: Normocephalic, atraumatic EYES: Conjunctivae clear, pupils appear equal, sclera nonicteric ENT: normal nose; moist mucous membranes NECK: Supple, normal ROM CARD: RRR; S1 and S2 appreciated RESP: Normal chest excursion without splinting or tachypnea; breath sounds clear and equal bilaterally; no wheezes, no rhonchi, no rales, no hypoxia or respiratory distress, speaking full sentences ABD/GI: Non-distended; soft, non-tender, no rebound, no guarding, no peritoneal signs BACK: The back appears normal EXT: Normal ROM in all joints; no deformity noted, no edema SKIN: Normal color for age and race; warm; no rash on exposed skin NEURO: Moves all extremities equally, normal speech PSYCH: The patient's mood and manner are appropriate.   ED Results / Procedures / Treatments   LABS: (all labs ordered are listed, but only abnormal results are displayed) Labs Reviewed  COMPREHENSIVE METABOLIC PANEL - Abnormal; Notable for the following components:      Result Value   Chloride 112 (*)    CO2 16 (*)    Glucose, Bld 130 (*)    BUN 82 (*)    Creatinine, Ser 2.78 (*)    Calcium 8.4 (*)    Albumin 2.6 (*)    GFR, Estimated 24 (*)    All other components within normal limits  CBC - Abnormal; Notable for the following components:   RBC 3.43 (*)    Hemoglobin 9.5 (*)    HCT 30.0 (*)    RDW 15.9 (*)    All other components within normal limits  URINALYSIS, ROUTINE W REFLEX MICROSCOPIC - Abnormal; Notable for the following components:   Color, Urine STRAW (*)    APPearance CLEAR (*)    Hgb urine dipstick SMALL (*)    Protein, ur >=300 (*)    All other components within normal limits  LIPASE, BLOOD  TROPONIN I (HIGH SENSITIVITY)     EKG:  EKG Interpretation  Date/Time:  Wednesday September 14 2022 04:18:32 EDT Ventricular  Rate:  103 PR Interval:  QRS Duration: 89 QT Interval:  355 QTC Calculation: 465 R Axis:   47 Text Interpretation: Atrial fibrillation Ventricular premature complex Borderline T abnormalities, inferior leads No significant change since last tracing Confirmed by Pryor Curia [phone]) on 09/14/2022 4:48:09 AM         RADIOLOGY: My personal review and interpretation of imaging:    I have personally reviewed all radiology reports.   No results found.   PROCEDURES:  Critical Care performed: No     .1-3 Lead EKG Interpretation  Performed by: Guy Seese, Delice Bison, DO Authorized by: Jahir Halt, Delice Bison, DO     Interpretation: abnormal     ECG rate:  66   ECG rate assessment: normal     Rhythm: atrial fibrillation     Ectopy: none     Conduction: normal       IMPRESSION / MDM / ASSESSMENT AND PLAN / ED COURSE  I reviewed the triage vital signs and the nursing notes.    Patient here with vomiting after taking Tylenol 3.  The patient is on the cardiac monitor to evaluate for evidence of arrhythmia and/or significant heart rate changes.   DIFFERENTIAL DIAGNOSIS (includes but not limited to):   Suspect this is an adverse reaction from taking narcotics.  May also be a viral gastroenteritis.  Benign abdominal exam so low suspicion for appendicitis, cholecystitis, pancreatitis, bowel obstruction.   Patient's presentation is most consistent with acute complicated illness / injury requiring diagnostic workup.   PLAN: Will obtain abdominal labs, urine, troponin, EKG.  Will give IV fluids, Zofran.   MEDICATIONS GIVEN IN ED: Medications  ondansetron (ZOFRAN-ODT) disintegrating tablet 4 mg (4 mg Oral Given 09/14/22 0212)  sodium chloride 0.9 % bolus 500 mL (0 mLs Intravenous Stopped 09/14/22 0354)  ondansetron (ZOFRAN) injection 4 mg (4 mg Intravenous Given 09/14/22 0248)  metoCLOPramide (REGLAN) injection 10 mg (10 mg Intravenous Given 09/14/22 0356)     ED COURSE: Labs show no  leukocytosis.  He has stable chronic anemia.  Stable chronic kidney disease.  He does have a bicarb of 16 but this looks like it has been present on his recent labs but normal anion gap.  Likely secondary to uremia.  Urine shows no sign of infection or dehydration.  Troponin is negative.  EKG shows no new ischemic change.  He has not had any vomiting after getting Zofran and Reglan and is tolerating p.o. here.  Abdominal exam continues to be benign and he is hemodynamically stable.  Have advised him to avoid codeine in the future and that this is not an allergic reaction but an adverse reaction that can easily happen with narcotic pain medication.  Will discharge with prescription of Zofran.  He has a PCP for follow-up.   At this time, I do not feel there is any life-threatening condition present. I reviewed all nursing notes, vitals, pertinent previous records.  All lab and urine results, EKGs, imaging ordered have been independently reviewed and interpreted by myself.  I reviewed all available radiology reports from any imaging ordered this visit.  Based on my assessment, I feel the patient is safe to be discharged home without further emergent workup and can continue workup as an outpatient as needed. Discussed all findings, treatment plan as well as usual and customary return precautions.  They verbalize understanding and are comfortable with this plan.  Outpatient follow-up has been provided as needed.  All questions have been answered.    CONSULTS:  none   OUTSIDE  RECORDS REVIEWED: Reviewed patient's office visit with Dr. Humphrey Rolls with pulmonology yesterday.       FINAL CLINICAL IMPRESSION(S) / ED DIAGNOSES   Final diagnoses:  Nausea and vomiting in adult     Rx / DC Orders   ED Discharge Orders          Ordered    ondansetron (ZOFRAN-ODT) 4 MG disintegrating tablet  Every 6 hours PRN        09/14/22 0239             Note:  This document was prepared using Dragon voice  recognition software and may include unintentional dictation errors.   Avrom Robarts, Delice Bison, DO 09/14/22 [phone]

## 2022-09-15 ENCOUNTER — Encounter: Payer: Medicare PPO | Attending: Physician Assistant | Admitting: Physician Assistant

## 2022-09-15 ENCOUNTER — Other Ambulatory Visit
Admission: RE | Admit: 2022-09-15 | Discharge: 2022-09-15 | Disposition: A | Discharge status: Home / Self Care | Payer: Medicare PPO | Source: Ambulatory Visit | Attending: Physician Assistant | Admitting: Physician Assistant

## 2022-09-15 DIAGNOSIS — L0231 Cutaneous abscess of buttock: Secondary | ICD-10-CM | POA: Diagnosis not present

## 2022-09-15 NOTE — Progress Notes (Addendum)
PSALMS, GARRINGER ([id number]) [id number] Page 1 of 5 Visit Report for 09/15/2022 Chief Complaint Document Details Patient Name: Date of Service: Larry Callahan MES F. 09/15/2022 9:00 A M Medical Record Number: [id number] Patient Account Number: [id number] Date of Birth/Sex: Treating RN: 04/20/1956 (67 y.o. Larry Callahan Primary Care Provider: [name] Other Clinician: [name] Referring Provider: Treating Provider/Extender: Larry Callahan Weeks in Treatment: 4 Information Obtained from: Patient Chief Complaint Right gluteal abscess Electronic Signature(s) Signed: 09/15/2022 9:13:11 AM By: Larry Keeler PA-C Entered By: Larry Callahan on 09/15/2022 09:13:11 -------------------------------------------------------------------------------- HPI Details Patient Name: Date of Service: Larry Callahan MES F. 09/15/2022 9:00 A M Medical Record Number: [id number] Patient Account Number: [id number] Date of Birth/Sex: Treating RN: 1956/05/31 (67 y.o. Larry Callahan Primary Care Provider: [name] Other Clinician: [name] Referring Provider: Treating Provider/Extender: Larry Callahan Weeks in Treatment: 4 History of Present Illness HPI Description: ADMISSION 08/15/2022 This is a 67 year old man who came in with very little medical information. There was some suggestion that he was in the hospital last month with cellulitis of the arm I will need to check this out thoroughly. He tells me that he received 2 courses of antibiotics last month were not really sure which ones they were but they are completed now. He is not really having any discomfort. His sister who lives nearby is dressing this with what sounds like Coban. I looked through [facility] health Callahan. He was in the hospital about 3 weeks ago for an infection of the right elbow. Prior to that he had been in an urgent care receiving Keflex. I do not think he was  admitted to hospital but may have spent some time in the ER. I do not see any cultures. 3/11; patient presents for follow-up. He has been using Hydrofera Blue to the right elbow. The wound has healed. Readmission: 09-01-2022 upon evaluation today patient presents for reevaluation here in the clinic he actually was previously seen for his elbow today he comes in with the elbow okay but unfortunately a right gluteal abscess. Unfortunately he tells me that this is somewhat painful. He was seen in the ER they gave him doxycycline he has not picked that up yet that was just yesterday. 09-08-2022 upon evaluation today patient's wound actually appears to be excellent. In fact he called in stating that was bleeding profusely have a note in communications concerning that. Nonetheless the home health nurse and reassured me he was doing okay and this appears to be doing just fine today as well. I think it is actually showing less pressure compared to last time I saw him as far as the overall surrounding tissue and how things appear. 09-15-2022 upon evaluation today patient's wound unfortunately appears to be infected. I do believe that he is going to require intervention as far as antibiotics are concerned I am actually going to grab a wound culture today as well. Electronic Signature(s) Signed: 09/15/2022 10:12:53 AM By: Larry Keeler PA-C Entered By: Larry Callahan on 09/15/2022 10:12:52 -------------------------------------------------------------------------------- Physical Exam Details Patient Name: Date of Service: Larry Callahan MES F. 09/15/2022 9:00 A M Medical Record Number: [id number] Patient Account Number: [id number] Date of Birth/Sex: Treating RN: Jan 31, 1956 (67 y.o. Larry Callahan Primary Care Provider: [name] Other Clinician: Holley, Larry Callahan ([id number]) [id number] Page 2 of 5 Referring Provider: Treating Provider/Extender: Larry Callahan Weeks in Treatment: 4 Constitutional Obese and well-hydrated in no acute  distress. Respiratory normal breathing without difficulty. Psychiatric this patient is able to make decisions and demonstrates good insight into disease process. Alert and Oriented x 3. pleasant and cooperative. Notes Patient's wound did not require sharp debridement it does however appear to be erythematous with a significant amount of drainage which did have some discoloration as well. I am good to go ahead and send in a prescription for him currently and this is good to be for Levaquin I think that skin to be the best option and will be something easier for him to remember and only having to take it once a day he can avoid any of his other iron supplements or otherwise. Electronic Signature(s) Signed: 09/15/2022 10:13:14 AM By: Larry Kelp PA-C Entered By: Larry Callahan on 09/15/2022 10:13:14 -------------------------------------------------------------------------------- Physician Orders Details Patient Name: Date of Service: Larry Callahan MES F. 09/15/2022 9:00 A M Medical Record Number: [id number] Patient Account Number: [id number] Date of Birth/Sex: Treating RN: 12-08-1955 (67 y.o. Larry Callahan Primary Care Provider: Sallyanne Callahan Other Clinician: [name] Referring Provider: Treating Provider/Extender: Larry Callahan Weeks in Treatment: 4 Verbal / Phone Orders: Yes Clinician: Huel Callahan Read Back and Verified: Yes Diagnosis Coding ICD-10 Coding Code Description L02.31 Cutaneous abscess of buttock Follow-up Appointments Return Appointment in 1 week. Home Health [facility] for wound care. May utilize formulary equivalent dressing for wound treatment orders unless otherwise specified. Home Health Nurse may visit PRN to address patients wound care needs. Larry Callahan HH Anesthetic (Use 'Patient Medications' Section for Anesthetic Order  Entry) Lidocaine applied to wound bed Edema Control - Lymphedema / Segmental Compressive Device / Other Elevate, Exercise Daily and A void Standing for Long Periods of Time. Elevate legs to the level of the heart and pump ankles as often as possible Elevate leg(s) parallel to the floor when sitting. Medications-Please add to medication list. ntibiotics - start levoquin P.O. A Wound Treatment Wound #2 - Gluteus Wound Laterality: Right Cleanser: Soap and Water 3 x Per Week/15 Days Discharge Instructions: Gently cleanse wound with antibacterial soap, rinse and pat dry prior to dressing wounds Prim Dressing: Hydrofera Blue Ready Transfer Foam, 2.5x2.5 (in/in) (Generic) 3 x Per Week/15 Days ary Discharge Instructions: Apply Hydrofera Blue Ready to wound bed as directed Secondary Dressing: ABD Pad 5x9 (in/in) 3 x Per Week/15 Days Discharge Instructions: cut to fit over HB Secondary Dressing: T Adhesive [employer], 4x4 (in/in) (Generic) 3 x Per Week/15 Days elfa Discharge Instructions: Apply over dressing to secure in place. Laboratory Larry Callahan, Larry Callahan ([id number]) [id number] Page 3 of 5 Bacteria identified in Wound by Culture (MICRO) - culture right glut LOINC Code: [id number] Convenience Name: Wound culture routine Patient Medications llergies: penicillin A Notifications Medication Indication Start End 09/15/2022 levofloxacin DOSE 1 - oral 500 mg tablet - 1 tablet oral once daily x 14 days Electronic Signature(s) Signed: 09/16/2022 8:47:01 AM By: Larry Kelp PA-C Signed: 09/16/2022 11:43:38 AM By: Larry Callahan Previous Signature: 09/15/2022 10:14:24 AM Version By: Larry Kelp PA-C Entered By: Larry Callahan on 09/15/2022 12:01:33 -------------------------------------------------------------------------------- Problem List Details Patient Name: Date of Service: Larry Callahan MES F. 09/15/2022 9:00 A M Medical Record Number: [id number] Patient Account  Number: [id number] Date of Birth/Sex: Treating RN: 05/29/1956 (67 y.o. Larry Callahan Primary Care Provider: Sallyanne Callahan Other Clinician: [name] Referring Provider: Treating Provider/Extender: Larry Callahan Weeks in Treatment: 4 Active Problems ICD-10 Encounter Code Description Active Date MDM Diagnosis L02.31 Cutaneous abscess of  buttock 08/15/2022 No Yes Inactive Problems Resolved Problems Electronic Signature(s) Signed: 09/15/2022 9:13:07 AM By: Larry Kelp PA-C Entered By: Larry Callahan on 09/15/2022 09:13:07 -------------------------------------------------------------------------------- Progress Note Details Patient Name: Date of Service: Larry Callahan MES F. 09/15/2022 9:00 A M Medical Record Number: [id number] Patient Account Number: [id number] Date of Birth/Sex: Treating RN: 1956-05-07 (67 y.o. Larry Callahan Primary Care Provider: Sallyanne Callahan Other Clinician: [name] Referring Provider: Treating Provider/Extender: Larry Callahan Weeks in Treatment: 4 Subjective Chief Complaint Information obtained from Patient Right gluteal abscess History of Present Illness (HPI) ADMISSION 08/15/2022 Larry Callahan, Larry Callahan ([id number]) [id number] Page 4 of 5 This is a 67 year old man who came in with very little medical information. There was some suggestion that he was in the hospital last month with cellulitis of the arm I will need to check this out thoroughly. He tells me that he received 2 courses of antibiotics last month were not really sure which ones they were but they are completed now. He is not really having any discomfort. His sister who lives nearby is dressing this with what sounds like Coban. I looked through [facility] Callahan. He was in the hospital about 3 weeks ago for an infection of the right elbow. Prior to that he had been in an urgent care receiving Keflex. I do not think he was  admitted to hospital but may have spent some time in the ER. I do not see any cultures. 3/11; patient presents for follow-up. He has been using Hydrofera Blue to the right elbow. The wound has healed. Readmission: 09-01-2022 upon evaluation today patient presents for reevaluation here in the clinic he actually was previously seen for his elbow today he comes in with the elbow okay but unfortunately a right gluteal abscess. Unfortunately he tells me that this is somewhat painful. He was seen in the ER they gave him doxycycline he has not picked that up yet that was just yesterday. 09-08-2022 upon evaluation today patient's wound actually appears to be excellent. In fact he called in stating that was bleeding profusely have a note in communications concerning that. Nonetheless the home health nurse and reassured me he was doing okay and this appears to be doing just fine today as well. I think it is actually showing less pressure compared to last time I saw him as far as the overall surrounding tissue and how things appear. 09-15-2022 upon evaluation today patient's wound unfortunately appears to be infected. I do believe that he is going to require intervention as far as antibiotics are concerned I am actually going to grab a wound culture today as well. Objective Constitutional Obese and well-hydrated in no acute distress. Vitals Time Taken: 9:12 AM, Height: 71 in, Weight: 229 lbs, BMI: 31.9, Temperature: 97.7 F, Pulse: 88 bpm, Respiratory Rate: 18 breaths/min, Blood Pressure: 146/83 mmHg. Respiratory normal breathing without difficulty. Psychiatric this patient is able to make decisions and demonstrates good insight into disease process. Alert and Oriented x 3. pleasant and cooperative. General Notes: Patient's wound did not require sharp debridement it does however appear to be erythematous with a significant amount of drainage which did have some discoloration as well. I am good to go ahead and  send in a prescription for him currently and this is good to be for Levaquin I think that skin to be the best option and will be something easier for him to remember and only having to take it once a day he can avoid any  of his other iron supplements or otherwise. Integumentary (Hair, Skin) Wound #2 status is Open. Original cause of wound was Gradually Appeared. The date acquired was: 08/29/2022. The wound has been in treatment 2 weeks. The wound is located on the Right Gluteus. The wound measures 3.3cm length x 3.4cm width x 0.4cm depth; 8.812cm^2 area and 3.525cm^3 volume. There is no tunneling noted, however, there is undermining starting at 4:00 and ending at 6:00 with a maximum distance of 1.3cm. There is a medium amount of sanguinous drainage noted. There is no granulation within the wound bed. There is a large (67-100%) amount of necrotic tissue within the wound bed including Eschar and Adherent Slough. Assessment Active Problems ICD-10 Cutaneous abscess of buttock Plan Follow-up Appointments: Return Appointment in 1 week. Home Health: [facility] for wound care. May utilize formulary equivalent dressing for wound treatment orders unless otherwise specified. Home Health Nurse may visit PRN to address patientoos wound care needs. Larry Callahan HH Anesthetic (Use 'Patient Medications' Section for Anesthetic Order Entry): Lidocaine applied to wound bed Edema Control - Lymphedema / Segmental Compressive Device / Other: Elevate, Exercise Daily and Avoid Standing for Long Periods of Time. Elevate legs to the level of the heart and pump ankles as often as possible Elevate leg(s) parallel to the floor when sitting. Medications-Please add to medication list.: P.O. Antibiotics - start levoquin Larry Callahan, Larry Callahan ([id number]) [id number] Page 5 of 5 Laboratory ordered were: Wound culture routine - culture right glut The following medication(s) was prescribed:  levofloxacin oral 500 mg tablet 1 1 tablet oral once daily x 14 days starting 09/15/2022 WOUND #2: - Gluteus Wound Laterality: Right Cleanser: Soap and Water 3 x Per Week/15 Days Discharge Instructions: Gently cleanse wound with antibacterial soap, rinse and pat dry prior to dressing wounds Prim Dressing: Hydrofera Blue Ready Transfer Foam, 2.5x2.5 (in/in) (Generic) 3 x Per Week/15 Days ary Discharge Instructions: Apply Hydrofera Blue Ready to wound bed as directed Secondary Dressing: ABD Pad 5x9 (in/in) 3 x Per Week/15 Days Discharge Instructions: cut to fit over HB Secondary Dressing: T Adhesive [employer], 4x4 (in/in) (Generic) 3 x Per Week/15 Days elfa Discharge Instructions: Apply over dressing to secure in place. 1. Based on what I am seeing I did go and obtain a wound culture we can send this in for further evaluation he is in agreement the plan. We will make any changes to the treatment plan based on the results of the culture. 2. Also going to suggest going ahead and placing him on Levaquin and he is in agreement with the plan I do recommend that he take this in the evening time he should not be taking it first thing in the morning with his other medications this way with the evening medicines he takes them at 4 and then subsequently he can take this at dinnertime. He is in agreement with that plan. 3. With regard to the dressings were going to stick with the [facility] although I am going to recommend that we put a ABD pad cut over top of that and then secured this with his island dressing that he has been using this should give it a little bit more drainage control. We will see patient back for reevaluation in 1 week here in the clinic. If anything worsens or changes patient will contact our office for additional recommendations. Electronic Signature(s) Signed: 09/15/2022 10:14:57 AM By: Larry Kelp PA-C Entered By: Larry Callahan on 09/15/2022  10:14:57 -------------------------------------------------------------------------------- SuperBill Details Patient  Name: Date of Service: Larry Callahan MES F. 09/15/2022 Medical Record Number: [id number] Patient Account Number: [id number] Date of Birth/Sex: Treating RN: November 30, 1955 (67 y.o. Larry Callahan Primary Care Provider: Sallyanne Callahan Other Clinician: [name] Referring Provider: Treating Provider/Extender: Larry Callahan Weeks in Treatment: 4 Diagnosis Coding ICD-10 Codes Code Description L02.31 Cutaneous abscess of buttock Facility Procedures : CPT4 Code: [id number] Description: 99213 - WOUND CARE VISIT-LEV 3 EST PT Modifier: Quantity: 1 Physician Procedures : CPT4 Code Description Modifier [id number] - WC PHYS LEVEL 4 - EST PT ICD-10 Diagnosis Description L02.31 Cutaneous abscess of buttock Quantity: 1 Electronic Signature(s) Signed: 09/15/2022 10:15:06 AM By: Larry Kelp PA-C Entered By: Larry Callahan on 09/15/2022 10:15:06

## 2022-09-16 DIAGNOSIS — L0231 Cutaneous abscess of buttock: Secondary | ICD-10-CM | POA: Diagnosis not present

## 2022-09-16 DIAGNOSIS — Z602 Problems related to living alone: Secondary | ICD-10-CM | POA: Diagnosis not present

## 2022-09-16 DIAGNOSIS — G4733 Obstructive sleep apnea (adult) (pediatric): Secondary | ICD-10-CM | POA: Diagnosis not present

## 2022-09-16 DIAGNOSIS — E119 Type 2 diabetes mellitus without complications: Secondary | ICD-10-CM | POA: Diagnosis not present

## 2022-09-16 DIAGNOSIS — I110 Hypertensive heart disease with heart failure: Secondary | ICD-10-CM | POA: Diagnosis not present

## 2022-09-16 DIAGNOSIS — I509 Heart failure, unspecified: Secondary | ICD-10-CM | POA: Diagnosis not present

## 2022-09-16 DIAGNOSIS — J449 Chronic obstructive pulmonary disease, unspecified: Secondary | ICD-10-CM | POA: Diagnosis not present

## 2022-09-16 DIAGNOSIS — I2510 Atherosclerotic heart disease of native coronary artery without angina pectoris: Secondary | ICD-10-CM | POA: Diagnosis not present

## 2022-09-16 DIAGNOSIS — E785 Hyperlipidemia, unspecified: Secondary | ICD-10-CM | POA: Diagnosis not present

## 2022-09-16 NOTE — Progress Notes (Signed)
KENYADA, HOMEIER ([id number]) [id number] Page 1 of 7 Visit Report for 09/15/2022 Arrival Information Details Patient Name: Date of Service: Larry Callahan. 09/15/2022 9:00 A M Medical Record Number: [id number] Patient Account Number: [id number] Date of Birth/Sex: Treating RN: 11-01-1955 (67 y.o. [name] Primary Care Raeden Schippers: Sallyanne Kuster Other Clinician: Betha Loa Referring Mara Favero: Treating Lota Leamer/Extender: Weldon Inches Weeks in Treatment: 4 Visit Information History Since Last Visit All ordered tests and consults were completed: No Patient Arrived: Ambulatory Added or deleted any medications: No Arrival Time: 09:05 Any new allergies or adverse reactions: No Transfer Assistance: None Had a fall or experienced change in No Patient Identification Verified: Yes activities of daily living that may affect Secondary Verification Process Completed: Yes risk of falls: Patient Requires Transmission-Based Precautions: No Signs or symptoms of abuse/neglect since last visito No Patient Has Alerts: Yes Hospitalized since last visit: No Patient Alerts: Patient on Blood Thinner Implantable device outside of the clinic excluding No cellular tissue based products placed in the center since last visit: Has Dressing in Place as Prescribed: Yes Pain Present Now: Yes Electronic Signature(s) Signed: 09/16/2022 11:43:38 AM By: Betha Loa Entered By: Betha Loa on 09/15/2022 09:11:18 -------------------------------------------------------------------------------- Clinic Level of Care Assessment Details Patient Name: Date of Service: Larry Callahan. 09/15/2022 9:00 A M Medical Record Number: [id number] Patient Account Number: [id number] Date of Birth/Sex: Treating RN: 1956-02-11 (67 y.o. [name] Primary Care Kevron Patella: Sallyanne Kuster Other Clinician: Betha Loa Referring Annalyn Blecher: Treating Donavon Kimrey/Extender:  Weldon Inches Weeks in Treatment: 4 Clinic Level of Care Assessment Items TOOL 4 Quantity Score []  - 0 Use when only an EandM is performed on FOLLOW-UP visit ASSESSMENTS - Nursing Assessment / Reassessment X- 1 10 Reassessment of Co-morbidities (includes updates in patient status) X- 1 5 Reassessment of Adherence to Treatment Plan ASSESSMENTS - Wound and Skin A ssessment / Reassessment X - Simple Wound Assessment / Reassessment - one wound 1 5 []  - 0 Complex Wound Assessment / Reassessment - multiple wounds []  - 0 Dermatologic / Skin Assessment (not related to wound area) ASSESSMENTS - Focused Assessment []  - 0 Circumferential Edema Measurements - multi extremities []  - 0 Nutritional Assessment / Counseling / Intervention []  - 0 Lower Extremity Assessment (monofilament, tuning fork, pulses) []  - 0 Peripheral Arterial Disease Assessment (using hand held doppler) ASSESSMENTS - Ostomy and/or Continence Assessment and Care []  - 0 Incontinence Assessment and Management []  - 0 Ostomy Care Assessment and Management (repouching, etc.) PROCESS - Coordination of Care NEIL, SOUN ([id number]) [id number] Page 2 of 7 X- 1 15 Simple Patient / Family Education for ongoing care []  - 0 Complex (extensive) Patient / Family Education for ongoing care []  - 0 Staff obtains Chiropractor, Records, T Results / Process Orders est []  - 0 Staff telephones HHA, Nursing Homes / Clarify orders / etc []  - 0 Routine Transfer to another Facility (non-emergent condition) []  - 0 Routine Hospital Admission (non-emergent condition) []  - 0 New Admissions / Manufacturing engineer / Ordering NPWT Apligraf, etc. , []  - 0 Emergency Hospital Admission (emergent condition) X- 1 10 Simple Discharge Coordination []  - 0 Complex (extensive) Discharge Coordination PROCESS - Special Needs []  - 0 Pediatric / Minor Patient Management []  - 0 Isolation Patient  Management []  - 0 Hearing / Language / Visual special needs []  - 0 Assessment of Community assistance (transportation, D/C planning, etc.) []  - 0 Additional assistance / Altered mentation []  - 0 Support Surface(s)  Assessment (bed, cushion, seat, etc.) INTERVENTIONS - Wound Cleansing / Measurement X - Simple Wound Cleansing - one wound 1 5 []  - 0 Complex Wound Cleansing - multiple wounds X- 1 5 Wound Imaging (photographs - any number of wounds) []  - 0 Wound Tracing (instead of photographs) X- 1 5 Simple Wound Measurement - one wound []  - 0 Complex Wound Measurement - multiple wounds INTERVENTIONS - Wound Dressings []  - 0 Small Wound Dressing one or multiple wounds X- 1 15 Medium Wound Dressing one or multiple wounds []  - 0 Large Wound Dressing one or multiple wounds []  - 0 Application of Medications - topical []  - 0 Application of Medications - injection INTERVENTIONS - Miscellaneous []  - 0 External ear exam X- 1 5 Specimen Collection (cultures, biopsies, blood, body fluids, etc.) X- 1 5 Specimen(s) / Culture(s) sent or taken to Lab for analysis []  - 0 Patient Transfer (multiple staff / Nurse, adultHoyer Lift / Similar devices) []  - 0 Simple Staple / Suture removal (25 or less) []  - 0 Complex Staple / Suture removal (26 or more) []  - 0 Hypo / Hyperglycemic Management (close monitor of Blood Glucose) []  - 0 Ankle / Brachial Index (ABI) - do not check if billed separately X- 1 5 Vital Signs Has the patient been seen at the hospital within the last three years: Yes Total Score: 90 Level Of Care: New/Established - Level 3 Electronic Signature(s) Signed: 09/16/2022 11:43:38 AM By: Thea GistVenable, Angie Heney, Adelard Callahan ([id number]) [id number] Page 3 of 7 Entered By: Betha LoaVenable, Angie on 09/15/2022 10:00:36 -------------------------------------------------------------------------------- Encounter Discharge Information Details Patient Name: Date of  Service: Larry LinkRIGGA Callahan, Larry Callahan. 09/15/2022 9:00 A M Medical Record Number: [id number] Patient Account Number: [id number] Date of Birth/Sex: Treating RN: 10-19-1955 (67 y.o. Arthur HolmsM) Woody, Kim Primary Care Yehia Mcbain: Sallyanne KusterAbernathy, Alyssa Other Clinician: Betha LoaVenable, Angie Referring Kerriann Kamphuis: Treating Sherial Ebrahim/Extender: Weldon InchesStone, Hoyt Abernathy, Alyssa Weeks in Treatment: 4 Encounter Discharge Information Items Discharge Condition: Stable Ambulatory Status: Ambulatory Discharge Destination: Home Transportation: Private Auto Accompanied By: self Schedule Follow-up Appointment: Yes Clinical Summary of Care: Electronic Signature(s) Signed: 09/16/2022 11:43:38 AM By: Betha LoaVenable, Angie Entered By: Betha LoaVenable, Angie on 09/15/2022 12:02:21 -------------------------------------------------------------------------------- Lower Extremity Assessment Details Patient Name: Date of Service: Larry LinkIGGA Callahan, Larry Callahan. 09/15/2022 9:00 A M Medical Record Number: [id number] Patient Account Number: [id number] Date of Birth/Sex: Treating RN: 10-19-1955 (67 y.o. Arthur HolmsM) Woody, Kim Primary Care Jkwon Treptow: Sallyanne KusterAbernathy, Alyssa Other Clinician: Betha LoaVenable, Angie Referring Zahara Rembert: Treating Kaarin Pardy/Extender: Weldon InchesStone, Hoyt Abernathy, Alyssa Weeks in Treatment: 4 Electronic Signature(s) Signed: 09/16/2022 9:18:23 AM By: Elliot GurneyWoody, BSN, RN, CWS, Kim RN, BSN Signed: 09/16/2022 11:43:38 AM By: Betha LoaVenable, Angie Entered By: Betha LoaVenable, Angie on 09/15/2022 09:47:51 -------------------------------------------------------------------------------- Multi Wound Chart Details Patient Name: Date of Service: Larry LinkIGGA Callahan, Larry Callahan. 09/15/2022 9:00 A M Medical Record Number: [id number] Patient Account Number: [id number] Date of Birth/Sex: Treating RN: 10-19-1955 (67 y.o. Arthur HolmsM) Woody, Kim Primary Care Tattiana Fakhouri: Sallyanne KusterAbernathy, Alyssa Other Clinician: Betha LoaVenable, Angie Referring Verita Kuroda: Treating Marki Frede/Extender: Weldon InchesStone, Hoyt Abernathy, Alyssa Weeks in Treatment: 4 Vital Signs Height(in):  71 Pulse(bpm): 88 Weight(lbs): 229 Blood Pressure(mmHg): 146/83 Body Mass Index(BMI): 31.9 Temperature(Callahan): 97.7 Respiratory Rate(breaths/min): 18 [2:Photos:] [N/A:N/A] Right Gluteus N/A N/A Wound Location: Gradually Appeared N/A N/A Wounding Event: Abscess N/A N/A Primary Etiology: Chronic Obstructive Pulmonary N/A N/A Comorbid History: Disease (COPD), Arrhythmia, Coronary Artery Disease, Hypertension, Type II Diabetes 08/29/2022 N/A N/A Date Acquired: 2 N/A N/A Weeks of Treatment: Open N/A N/A Wound Status: No N/A N/A Wound Recurrence: 3.3x3.4x0.4 N/A N/A Measurements L x W  x D (cm) 8.812 N/A N/A A (cm) : rea 3.525 N/A N/A Volume (cm) : -28.20% N/A N/A % Reduction in A rea: -71.00% N/A N/A % Reduction in Volume: 4 Starting Position 1 (o'clock): 6 Ending Position 1 (o'clock): 1.3 Maximum Distance 1 (cm): Yes N/A N/A Undermining: Full Thickness Without Exposed N/A N/A Classification: Support Structures Medium N/A N/A Exudate A mount: Sanguinous N/A N/A Exudate Type: red N/A N/A Exudate Color: None Present (0%) N/A N/A Granulation Amount: Large (67-100%) N/A N/A Necrotic Amount: Eschar, Adherent Slough N/A N/A Necrotic Tissue: Fascia: No N/A N/A Exposed Structures: Fat Layer (Subcutaneous Tissue): No Tendon: No Muscle: No Joint: No Bone: No None N/A N/A Epithelialization: Treatment Notes Electronic Signature(s) Signed: 09/16/2022 11:43:38 AM By: Betha Loa Entered By: Betha Loa on 09/15/2022 09:48:12 -------------------------------------------------------------------------------- Multi-Disciplinary Care Plan Details Patient Name: Date of Service: Larry Callahan. 09/15/2022 9:00 A M Medical Record Number: [id number] Patient Account Number: [id number] Date of Birth/Sex: Treating RN: [birth year] (67 y.o. [name] Primary Care Bernita Beckstrom: Sallyanne Kuster Other Clinician: Betha Loa Referring Sanjith Siwek: Treating  Azaryah Heathcock/Extender: Weldon Inches Weeks in Treatment: 4 Active Inactive Necrotic Tissue Nursing Diagnoses: Knowledge deficit related to management of necrotic/devitalized tissue Goals: Necrotic/devitalized tissue will be minimized in the wound bed Date Initiated: 09/01/2022 Target Resolution Date: 10/02/2022 Goal Status: Active Interventions: Assess patient pain level pre-, during and post procedure and prior to discharge Provide education on necrotic tissue and debridement process Notes: Electronic Signature(s) Signed: 09/16/2022 9:18:23 AM By: Elliot Gurney, BSN, RN, CWS, Kim RN, BSN Signed: 09/16/2022 11:43:38 AM By: Thea Gist ([id number]) AM By: Levonne Spiller.pdf Page 5 of 7 Signed: 09/16/2022 11:43:38 Entered By: Betha Loa on 09/15/2022 10:07:39 -------------------------------------------------------------------------------- Pain Assessment Details Patient Name: Date of Service: Larry Callahan. 09/15/2022 9:00 A M Medical Record Number: [id number] Patient Account Number: [id number] Date of Birth/Sex: Treating RN: [birth year] (67 y.o. [name] Primary Care Mekenna Finau: Sallyanne Kuster Other Clinician: Betha Loa Referring Alvilda Mckenna: Treating Labib Cwynar/Extender: Weldon Inches Weeks in Treatment: 4 Active Problems Location of Pain Severity and Description of Pain Patient Has Paino Yes Site Locations Pain Location: Pain in Ulcers Duration of the Pain. Constant / Intermittento Constant Rate the pain. Current Pain Level: 6 Character of Pain Describe the Pain: Burning Pain Management and Medication Current Pain Management: Medication: No Cold Application: No Rest: No Massage: No Activity: No T.E.N.S.: No Heat Application: No Leg drop or elevation: No Is the Current Pain Management Adequate: Inadequate How does your wound impact your activities of daily livingo Sleep: No Bathing:  No Appetite: No Relationship With Others: No Bladder Continence: No Emotions: No Bowel Continence: No Work: No Toileting: No Drive: No Dressing: No Hobbies: No Psychologist, prison and probation services) Signed: 09/16/2022 9:18:23 AM By: Elliot Gurney, BSN, RN, CWS, Kim RN, BSN Signed: 09/16/2022 11:43:38 AM By: Betha Loa Entered By: Betha Loa on 09/15/2022 09:14:55 -------------------------------------------------------------------------------- Patient/Caregiver Education Details Patient Name: Date of Service: Larry Callahan. 4/4/2024andnbsp9:00 A M Medical Record Number: [id number] Patient Account Number: [id number] Date of Birth/Gender: Treating RN: Oct 14, 1955 (67 y.o. [name] Primary Care Physician: Sallyanne Kuster Other Clinician: Betha Loa Referring Physician: Treating Physician/Extender: Weldon Inches Weeks in Treatment: [address], [place] Callahan ([id number]) [id number] Page 6 of 7 Education Assessment Education Provided To: Patient Education Topics Provided Infection: Handouts: Other: start taking Levaquin as directed Methods: Explain/Verbal Responses: State content correctly Pressure: Handouts: Pressure Injury: Prevention and Offloading, Other: stand up every  1-2 hours, turn often Methods: Explain/Verbal Responses: State content correctly Electronic Signature(s) Signed: 09/16/2022 11:43:38 AM By: Betha Loa Entered By: Betha Loa on 09/15/2022 10:09:04 -------------------------------------------------------------------------------- Wound Assessment Details Patient Name: Date of Service: Larry Callahan. 09/15/2022 9:00 A M Medical Record Number: [id number] Patient Account Number: [id number] Date of Birth/Sex: Treating RN: [birth year] (67 y.o. Larry Callahan, Larry Callahan Primary Care Reshaun Briseno: Sallyanne Kuster Other Clinician: Betha Loa Referring Nafeesa Dils: Treating Sheamus Hasting/Extender: Weldon Inches Weeks in  Treatment: 4 Wound Status Wound Number: 2 Primary Abscess Etiology: Wound Location: Right Gluteus Wound Open Wounding Event: Gradually Appeared Status: Date Acquired: 08/29/2022 Comorbid Chronic Obstructive Pulmonary Disease (COPD), Arrhythmia, Weeks Of Treatment: 2 History: Coronary Artery Disease, Hypertension, Type II Diabetes Clustered Wound: No Photos Wound Measurements Length: (cm) 3.3 Width: (cm) 3.4 Depth: (cm) 0.4 Area: (cm) 8.812 Volume: (cm) 3.525 % Reduction in Area: -28.2% % Reduction in Volume: -71% Epithelialization: None Tunneling: No Undermining: Yes Starting Position (o'clock): 4 Ending Position (o'clock): 6 Maximum Distance: (cm) 1.3 Wound Description Classification: Full Thickness Without Exposed Support Structures Exudate Amount: Medium Exudate Type: Sanguinous Exudate Color: red YVES, DISANTIS ([id number]) Wound Bed Granulation Amount: None Present (0%) Necrotic Amount: Large (67-100%) Necrotic Quality: Eschar, Adherent Slough Foul Odor After Cleansing: No Slough/Fibrino Yes [phone].pdf Page 7 of 7 Exposed Structure Fascia Exposed: No Fat Layer (Subcutaneous Tissue) Exposed: No Tendon Exposed: No Muscle Exposed: No Joint Exposed: No Bone Exposed: No Treatment Notes Wound #2 (Gluteus) Wound Laterality: Right Cleanser Soap and Water Discharge Instruction: Gently cleanse wound with antibacterial soap, rinse and pat dry prior to dressing wounds Peri-Wound Care Topical Primary Dressing Hydrofera Blue Ready Transfer Foam, 2.5x2.5 (in/in) Discharge Instruction: Apply Hydrofera Blue Ready to wound bed as directed Secondary Dressing ABD Pad 5x9 (in/in) Discharge Instruction: cut to fit over HB T Adhesive [employer], 4x4 (in/in) elfa Discharge Instruction: Apply over dressing to secure in place. Secured With Compression Wrap Compression Stockings Facilities manager) Signed: 09/16/2022 9:18:23  AM By: Elliot Gurney, BSN, RN, CWS, Kim RN, BSN Signed: 09/16/2022 11:43:38 AM By: Betha Loa Entered By: Betha Loa on 09/15/2022 09:20:58 -------------------------------------------------------------------------------- Vitals Details Patient Name: Date of Service: Larry Callahan, Larry Callahan. 09/15/2022 9:00 A M Medical Record Number: [id number] Patient Account Number: [id number] Date of Birth/Sex: Treating RN: 1956-01-08 (67 y.o. [name] Primary Care Beatriz Settles: Sallyanne Kuster Other Clinician: Betha Loa Referring Jessicia Napolitano: Treating Selmer Adduci/Extender: Weldon Inches Weeks in Treatment: 4 Vital Signs Time Taken: 09:12 Temperature (Callahan): 97.7 Height (in): 71 Pulse (bpm): 88 Weight (lbs): 229 Respiratory Rate (breaths/min): 18 Body Mass Index (BMI): 31.9 Blood Pressure (mmHg): 146/83 Reference Range: 80 - 120 mg / dl Electronic Signature(s) Signed: 09/16/2022 11:43:38 AM By: Betha Loa Entered By: Betha Loa on 09/15/2022 09:14:49

## 2022-09-17 ENCOUNTER — Emergency Department: Payer: Medicare PPO

## 2022-09-17 ENCOUNTER — Other Ambulatory Visit: Payer: Self-pay

## 2022-09-17 ENCOUNTER — Emergency Department
Admission: EM | Admit: 2022-09-17 | Discharge: 2022-09-17 | Disposition: A | Discharge status: Home / Self Care | Payer: Medicare PPO | Attending: Emergency Medicine | Admitting: Emergency Medicine

## 2022-09-17 DIAGNOSIS — D631 Anemia in chronic kidney disease: Secondary | ICD-10-CM | POA: Diagnosis not present

## 2022-09-17 DIAGNOSIS — I10 Essential (primary) hypertension: Secondary | ICD-10-CM | POA: Diagnosis not present

## 2022-09-17 DIAGNOSIS — I130 Hypertensive heart and chronic kidney disease with heart failure and stage 1 through stage 4 chronic kidney disease, or unspecified chronic kidney disease: Secondary | ICD-10-CM | POA: Diagnosis not present

## 2022-09-17 DIAGNOSIS — E1142 Type 2 diabetes mellitus with diabetic polyneuropathy: Secondary | ICD-10-CM | POA: Diagnosis not present

## 2022-09-17 DIAGNOSIS — K5900 Constipation, unspecified: Secondary | ICD-10-CM

## 2022-09-17 DIAGNOSIS — I5023 Acute on chronic systolic (congestive) heart failure: Secondary | ICD-10-CM | POA: Insufficient documentation

## 2022-09-17 DIAGNOSIS — K5730 Diverticulosis of large intestine without perforation or abscess without bleeding: Secondary | ICD-10-CM | POA: Diagnosis not present

## 2022-09-17 DIAGNOSIS — J449 Chronic obstructive pulmonary disease, unspecified: Secondary | ICD-10-CM | POA: Diagnosis not present

## 2022-09-17 DIAGNOSIS — Z85858 Personal history of malignant neoplasm of other endocrine glands: Secondary | ICD-10-CM | POA: Insufficient documentation

## 2022-09-17 DIAGNOSIS — I2510 Atherosclerotic heart disease of native coronary artery without angina pectoris: Secondary | ICD-10-CM | POA: Insufficient documentation

## 2022-09-17 DIAGNOSIS — N289 Disorder of kidney and ureter, unspecified: Secondary | ICD-10-CM | POA: Diagnosis not present

## 2022-09-17 DIAGNOSIS — N184 Chronic kidney disease, stage 4 (severe): Secondary | ICD-10-CM | POA: Insufficient documentation

## 2022-09-17 DIAGNOSIS — K6289 Other specified diseases of anus and rectum: Secondary | ICD-10-CM | POA: Diagnosis not present

## 2022-09-17 DIAGNOSIS — Z85528 Personal history of other malignant neoplasm of kidney: Secondary | ICD-10-CM | POA: Diagnosis not present

## 2022-09-17 LAB — CBC WITH DIFFERENTIAL/PLATELET
Abs Immature Granulocytes: 0.03 10*3/uL (ref 0.00–0.07)
Basophils Absolute: 0 10*3/uL (ref 0.0–0.1)
Basophils Relative: 1 %
Eosinophils Absolute: 0.1 10*3/uL (ref 0.0–0.5)
Eosinophils Relative: 2 %
HCT: 39.1 % (ref 39.0–52.0)
Hemoglobin: 11.7 g/dL — ABNORMAL LOW (ref 13.0–17.0)
Immature Granulocytes: 1 %
Lymphocytes Relative: 31 %
Lymphs Abs: 1.1 10*3/uL (ref 0.7–4.0)
MCH: 27.3 pg (ref 26.0–34.0)
MCHC: 29.9 g/dL — ABNORMAL LOW (ref 30.0–36.0)
MCV: 91.1 fL (ref 80.0–100.0)
Monocytes Absolute: 0.2 10*3/uL (ref 0.1–1.0)
Monocytes Relative: 7 %
Neutro Abs: 2.1 10*3/uL (ref 1.7–7.7)
Neutrophils Relative %: 58 %
Platelets: 130 10*3/uL — ABNORMAL LOW (ref 150–400)
RBC: 4.29 MIL/uL (ref 4.22–5.81)
RDW: 15.8 % — ABNORMAL HIGH (ref 11.5–15.5)
WBC: 3.6 10*3/uL — ABNORMAL LOW (ref 4.0–10.5)
nRBC: 0 % (ref 0.0–0.2)

## 2022-09-17 LAB — URINALYSIS, ROUTINE W REFLEX MICROSCOPIC
Bilirubin Urine: NEGATIVE
Glucose, UA: NEGATIVE mg/dL
Hgb urine dipstick: NEGATIVE
Ketones, ur: NEGATIVE mg/dL
Leukocytes,Ua: NEGATIVE
Nitrite: NEGATIVE
Protein, ur: 100 mg/dL — AB
Specific Gravity, Urine: 1.009 (ref 1.005–1.030)
Squamous Epithelial / HPF: NONE SEEN /HPF (ref 0–5)
pH: 5 (ref 5.0–8.0)

## 2022-09-17 LAB — COMPREHENSIVE METABOLIC PANEL
ALT: 18 U/L (ref 0–44)
AST: 37 U/L (ref 15–41)
Albumin: 2.6 g/dL — ABNORMAL LOW (ref 3.5–5.0)
Alkaline Phosphatase: 63 U/L (ref 38–126)
Anion gap: 10 (ref 5–15)
BUN: 66 mg/dL — ABNORMAL HIGH (ref 8–23)
CO2: 15 mmol/L — ABNORMAL LOW (ref 22–32)
Calcium: 8.2 mg/dL — ABNORMAL LOW (ref 8.9–10.3)
Chloride: 112 mmol/L — ABNORMAL HIGH (ref 98–111)
Creatinine, Ser: 2.68 mg/dL — ABNORMAL HIGH (ref 0.61–1.24)
GFR, Estimated: 25 mL/min — ABNORMAL LOW (ref >60)
Glucose, Bld: 129 mg/dL — ABNORMAL HIGH (ref 70–99)
Potassium: 4.3 mmol/L (ref 3.5–5.1)
Sodium: 137 mmol/L (ref 135–145)
Total Bilirubin: 1 mg/dL (ref 0.3–1.2)
Total Protein: 6.7 g/dL (ref 6.5–8.1)

## 2022-09-17 LAB — LIPASE, BLOOD: Lipase: 49 U/L (ref 11–51)

## 2022-09-17 MED ORDER — POLYETHYLENE GLYCOL 3350 17 G PO PACK: 17.0000 g | PACK | Freq: Every day | ORAL | 0 refills | Status: DC

## 2022-09-17 NOTE — ED Provider Notes (Signed)
[facility] Provider Note    Event Date/Time   First MD Initiated Contact with Patient 09/17/22 1420     (approximate)   History   Constipation   HPI  Larry Callahan is a 67 y.o. male with a past medical history of peripheral artery disease, heart failure, upper GI bleed, CKD stage IV, COPD, hypertension who presents today for evaluation of rectal pain and constipation.  Patient reports that he has not had a bowel movement in 2 days.  He reports that he has not been eating as much as usual. He reports that he is still passing gas.  He reports that he has a known wound to his buttock for which she is being treated at the wound care center, reports that he has no complaints about this today and that is not what he is here for.  Patient Active Problem List   Diagnosis Date Noted   Left arm numbness 09/13/2022   Acute upper GI bleed 07/31/2022   Chest pain 07/31/2022   Hematoma of left flank 07/31/2022   Acute on chronic anemia 07/31/2022   Renal mass 07/31/2022   Upper GI bleed    Dizziness and giddiness 01/04/2022   Umbilical hernia 01/04/2022   Acute on chronic systolic CHF (congestive heart failure) 08/05/2021   Acquired thrombophilia 06/28/2021   Anemia of chronic kidney failure 06/28/2021   PAD (peripheral artery disease)    Cellulitis of right arm 03/02/2021   Atrial flutter 01/07/2021   Demand ischemia    Influenza A 10/30/2020   HLD (hyperlipidemia) 10/06/2020   Gastroesophageal reflux disease without esophagitis 04/19/2020   Elevated troponin I level 01/29/2020   Hematemesis 01/09/2020   Hospital discharge follow-up 11/26/2019   Obesity (BMI 30-39.9) 08/08/2019   Acute right hip pain 06/17/2019   Inability to ambulate due to hip 06/17/2019   CKD (chronic kidney disease), stage IV    Hypervolemia    Full code status 05/20/2019   Pleuritic chest pain    Acute kidney injury superimposed on CKD 3b (HCC) 05/09/2019   Acute on chronic  heart failure with preserved ejection fraction (HFpEF)    Chronic systolic CHF (congestive heart failure) 03/01/2019   Iron deficiency anemia 12/31/2018   Atopic dermatitis 11/24/2018   Screening for colon cancer 11/06/2017   Type 2 diabetes mellitus 11/06/2017   Hidradenitis 06/09/2017   Atherosclerotic heart disease of native coronary artery without angina pectoris 06/09/2017   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   Obstructive sleep apnea, adult 06/09/2017   Cigarette nicotine dependence with nicotine-induced disorder 06/09/2017   Diabetic polyneuropathy associated with type 2 diabetes mellitus 06/09/2017   Allergic rhinitis due to pollen 06/09/2017   Mixed hyperlipidemia 06/09/2017   COPD (chronic obstructive pulmonary disease) 06/09/2017   Wheezing 06/09/2017   Dysuria 06/09/2017   Essential hypertension 06/09/2017   Personal history of other malignant neoplasm of kidney 06/09/2017   Pain in right hip 06/09/2017   Secondary hyperparathyroidism, not elsewhere classified 06/09/2017   Tinea corporis 06/09/2017          Physical Exam   Triage Vital Signs: ED Triage Vitals  Enc Vitals Group     BP 09/17/22 1311 (!) 156/88     Pulse Rate 09/17/22 1311 73     Resp 09/17/22 1311 19     Temp 09/17/22 1311 98.1 F (36.7 C)     Temp Source 09/17/22 1311 Oral     SpO2 09/17/22 1311 97 %  Weight --      Height --      Head Circumference --      Peak Flow --      Pain Score 09/17/22 1312 2     Pain Loc --      Pain Edu? --      Excl. in GC? --     Most recent vital signs: Vitals:   09/17/22 1311  BP: (!) 156/88  Pulse: 73  Resp: 19  Temp: 98.1 F (36.7 C)  SpO2: 97%    Physical Exam Vitals and nursing note reviewed.  Constitutional:      General: Awake and alert. No acute distress.    Appearance: Normal appearance. The patient is normal weight.  HENT:     Head: Normocephalic and atraumatic.     Mouth: Mucous membranes are  moist.  Eyes:     General: PERRL. Normal EOMs        Right eye: No discharge.        Left eye: No discharge.     Conjunctiva/sclera: Conjunctivae normal.  Cardiovascular:     Rate and Rhythm: Normal rate and regular rhythm.     Pulses: Normal pulses.     Heart sounds: Normal heart sounds Pulmonary:     Effort: Pulmonary effort is normal. No respiratory distress.     Breath sounds: Normal breath sounds.  Abdominal:     Abdomen is soft. There is no abdominal tenderness. No rebound or guarding. No distention. Right buttock with 3 x 3 cm ulceration with clean edges.  There is Hydrofera Blue ready foam.  There is no surrounding erythema.  There is no drainage. Musculoskeletal:        General: No swelling. Normal range of motion.     Cervical back: Normal range of motion and neck supple.  Skin:    General: Skin is warm and dry.     Capillary Refill: Capillary refill takes less than 2 seconds.     Findings: No rash.  Neurological:     Mental Status: The patient is awake and alert.      ED Results / Procedures / Treatments   Labs (all labs ordered are listed, but only abnormal results are displayed) Labs Reviewed  CBC WITH DIFFERENTIAL/PLATELET - Abnormal; Notable for the following components:      Result Value   WBC 3.6 (*)    Hemoglobin 11.7 (*)    MCHC 29.9 (*)    RDW 15.8 (*)    Platelets 130 (*)    All other components within normal limits  COMPREHENSIVE METABOLIC PANEL - Abnormal; Notable for the following components:   Chloride 112 (*)    CO2 15 (*)    Glucose, Bld 129 (*)    BUN 66 (*)    Creatinine, Ser 2.68 (*)    Calcium 8.2 (*)    Albumin 2.6 (*)    GFR, Estimated 25 (*)    All other components within normal limits  URINALYSIS, ROUTINE W REFLEX MICROSCOPIC - Abnormal; Notable for the following components:   Color, Urine STRAW (*)    APPearance CLEAR (*)    Protein, ur 100 (*)    Bacteria, UA RARE (*)    All other components within normal limits  LIPASE,  BLOOD     EKG     RADIOLOGY I independently reviewed and interpreted imaging and agree with radiologists findings.     PROCEDURES:  Critical Care performed:   Procedures   MEDICATIONS  ORDERED IN ED: Medications - No data to display   IMPRESSION / MDM / ASSESSMENT AND PLAN / ED COURSE  I reviewed the triage vital signs and the nursing notes.   Differential diagnosis includes, but is not limited to, constipation, fecal impaction, diverticulitis, hernia, obstruction, abscess.  I reviewed the patient's chart.  Patient is seen by the wound care center, most recently 2 days ago for his known sacral ulceration.  He is currently on Levaquin for this.  He does not appear to be acutely infected now.  He has clean wound edges, no surrounding erythema, no drainage from the wound, no malodor, and he denies any complaints with this wound today.  Patient is awake and alert, hemodynamically stable and afebrile.  His abdomen is soft and nontender.  His sacral wound does not appear to be acutely infected.  Given his multiple comorbidities, he agreed to further workup.  Labs obtained are overall reassuring/at his baseline.  CAT scan obtained reveals no acute findings, and no significant increased stool burden.  Patient is reassured by these findings.  He was started on MiraLAX to help maintain normal bowel movements, though he does not require an enema today.  There is no rectal stool burden to requiring manual disimpaction.  Patient is reassured by these findings.  We discussed return precautions and importance of close outpatient follow-up.  Patient understands and agrees with plan.  He was discharged in stable condition.   Patient's presentation is most consistent with acute complicated illness / injury requiring diagnostic workup.     FINAL CLINICAL IMPRESSION(S) / ED DIAGNOSES   Final diagnoses:  Constipation, unspecified constipation type     Rx / DC Orders   ED Discharge Orders           Ordered    polyethylene glycol (MIRALAX) 17 g packet  Daily        09/17/22 1537             Note:  This document was prepared using Dragon voice recognition software and may include unintentional dictation errors.   Jackelyn Hoehn, PA-C 09/17/22 1719    Merwyn Katos, MD 09/17/22 1910

## 2022-09-17 NOTE — ED Triage Notes (Signed)
First nurse note: Pt presents from home c/o no bowel movement since Thursday. Endorses abd pain per EMS

## 2022-09-17 NOTE — Discharge Instructions (Signed)
Your labs is at your baseline, and your CT scan is normal.  You may take the MiraLAX to help with your bowel movements, though there is not a significant stool burden on your CT scan.  Please return for any new, worsening, or change in symptoms or other concerns.  Please follow-up with your outpatient provider this week.  It was a pleasure caring for you today.

## 2022-09-17 NOTE — ED Triage Notes (Signed)
See original triage note. Pt reports small amount of blood that he can't tell is from his abscess or stool, pt reports mild pressure to use the bathroom. Pt takes blood thinners.

## 2022-09-19 ENCOUNTER — Ambulatory Visit: Payer: Medicare PPO

## 2022-09-19 ENCOUNTER — Other Ambulatory Visit: Payer: Self-pay | Admitting: Internal Medicine

## 2022-09-19 ENCOUNTER — Telehealth: Payer: Self-pay | Admitting: Nurse Practitioner

## 2022-09-19 DIAGNOSIS — G4733 Obstructive sleep apnea (adult) (pediatric): Secondary | ICD-10-CM | POA: Diagnosis not present

## 2022-09-19 DIAGNOSIS — E119 Type 2 diabetes mellitus without complications: Secondary | ICD-10-CM | POA: Diagnosis not present

## 2022-09-19 DIAGNOSIS — L0231 Cutaneous abscess of buttock: Secondary | ICD-10-CM | POA: Diagnosis not present

## 2022-09-19 DIAGNOSIS — Z602 Problems related to living alone: Secondary | ICD-10-CM | POA: Diagnosis not present

## 2022-09-19 DIAGNOSIS — I2510 Atherosclerotic heart disease of native coronary artery without angina pectoris: Secondary | ICD-10-CM | POA: Diagnosis not present

## 2022-09-19 DIAGNOSIS — J449 Chronic obstructive pulmonary disease, unspecified: Secondary | ICD-10-CM | POA: Diagnosis not present

## 2022-09-19 DIAGNOSIS — Z760 Encounter for issue of repeat prescription: Secondary | ICD-10-CM

## 2022-09-19 DIAGNOSIS — I509 Heart failure, unspecified: Secondary | ICD-10-CM | POA: Diagnosis not present

## 2022-09-19 DIAGNOSIS — I110 Hypertensive heart disease with heart failure: Secondary | ICD-10-CM | POA: Diagnosis not present

## 2022-09-19 DIAGNOSIS — E785 Hyperlipidemia, unspecified: Secondary | ICD-10-CM | POA: Diagnosis not present

## 2022-09-19 MED ORDER — ROSUVASTATIN CALCIUM 10 MG PO TABS
10.0000 mg | ORAL_TABLET | Freq: Every day | ORAL | 1 refills | Status: DC
Start: 2022-09-19 — End: 2023-09-18

## 2022-09-19 NOTE — Telephone Encounter (Signed)
Done-sent

## 2022-09-20 LAB — AEROBIC CULTURE W GRAM STAIN (SUPERFICIAL SPECIMEN)

## 2022-09-21 ENCOUNTER — Telehealth: Payer: Self-pay | Admitting: Cardiovascular Disease

## 2022-09-21 DIAGNOSIS — E119 Type 2 diabetes mellitus without complications: Secondary | ICD-10-CM | POA: Diagnosis not present

## 2022-09-21 DIAGNOSIS — I110 Hypertensive heart disease with heart failure: Secondary | ICD-10-CM | POA: Diagnosis not present

## 2022-09-21 DIAGNOSIS — E785 Hyperlipidemia, unspecified: Secondary | ICD-10-CM | POA: Diagnosis not present

## 2022-09-21 DIAGNOSIS — J449 Chronic obstructive pulmonary disease, unspecified: Secondary | ICD-10-CM | POA: Diagnosis not present

## 2022-09-21 DIAGNOSIS — L0231 Cutaneous abscess of buttock: Secondary | ICD-10-CM | POA: Diagnosis not present

## 2022-09-21 DIAGNOSIS — I2510 Atherosclerotic heart disease of native coronary artery without angina pectoris: Secondary | ICD-10-CM | POA: Diagnosis not present

## 2022-09-21 DIAGNOSIS — I509 Heart failure, unspecified: Secondary | ICD-10-CM | POA: Diagnosis not present

## 2022-09-21 DIAGNOSIS — Z602 Problems related to living alone: Secondary | ICD-10-CM | POA: Diagnosis not present

## 2022-09-21 DIAGNOSIS — G4733 Obstructive sleep apnea (adult) (pediatric): Secondary | ICD-10-CM | POA: Diagnosis not present

## 2022-09-21 NOTE — Telephone Encounter (Signed)
Pt would like a callback regarding appt on 4/12. Pt states that he needs to speak with a nurse regarding this as soon as possible. Please advise   Pt states if there is no answer to leave message due to him leaving shortly.

## 2022-09-21 NOTE — Telephone Encounter (Signed)
Returned call to patient, he states he uses transportation services to get to his appointments. His transportation for his appointment on 09/23/22 with Charlsie Quest, NP is not picking him up until 12:30pm.  Patient is requesting to be see at 1:30pm on this day instead. He does not want to reschedule for another day. He reports his arms, shoulders and hands hurt and he wants to be seen this week.  Will forward to scheduling to assist. Patient said he is going out to run errands, but if you call and he does not answer it is OK to leave detailed message on his answering machine.

## 2022-09-22 ENCOUNTER — Telehealth: Payer: Self-pay

## 2022-09-22 ENCOUNTER — Emergency Department: Payer: Medicare PPO

## 2022-09-22 ENCOUNTER — Encounter: Payer: Medicare PPO | Admitting: Physician Assistant

## 2022-09-22 ENCOUNTER — Other Ambulatory Visit: Payer: Self-pay

## 2022-09-22 ENCOUNTER — Observation Stay
Admission: EM | Admit: 2022-09-22 | Discharge: 2022-09-24 | Disposition: A | Discharge status: Home / Self Care | Payer: Medicare PPO | Attending: Student | Admitting: Student

## 2022-09-22 DIAGNOSIS — I10 Essential (primary) hypertension: Secondary | ICD-10-CM | POA: Diagnosis not present

## 2022-09-22 DIAGNOSIS — I130 Hypertensive heart and chronic kidney disease with heart failure and stage 1 through stage 4 chronic kidney disease, or unspecified chronic kidney disease: Secondary | ICD-10-CM | POA: Diagnosis not present

## 2022-09-22 DIAGNOSIS — R069 Unspecified abnormalities of breathing: Secondary | ICD-10-CM | POA: Diagnosis not present

## 2022-09-22 DIAGNOSIS — R7989 Other specified abnormal findings of blood chemistry: Secondary | ICD-10-CM | POA: Insufficient documentation

## 2022-09-22 DIAGNOSIS — J449 Chronic obstructive pulmonary disease, unspecified: Secondary | ICD-10-CM | POA: Diagnosis not present

## 2022-09-22 DIAGNOSIS — I2510 Atherosclerotic heart disease of native coronary artery without angina pectoris: Secondary | ICD-10-CM | POA: Diagnosis not present

## 2022-09-22 DIAGNOSIS — R042 Hemoptysis: Principal | ICD-10-CM | POA: Insufficient documentation

## 2022-09-22 DIAGNOSIS — Z7901 Long term (current) use of anticoagulants: Secondary | ICD-10-CM | POA: Diagnosis not present

## 2022-09-22 DIAGNOSIS — Z794 Long term (current) use of insulin: Secondary | ICD-10-CM | POA: Diagnosis not present

## 2022-09-22 DIAGNOSIS — E1122 Type 2 diabetes mellitus with diabetic chronic kidney disease: Secondary | ICD-10-CM | POA: Insufficient documentation

## 2022-09-22 DIAGNOSIS — R0602 Shortness of breath: Secondary | ICD-10-CM | POA: Diagnosis not present

## 2022-09-22 DIAGNOSIS — Z79899 Other long term (current) drug therapy: Secondary | ICD-10-CM | POA: Diagnosis not present

## 2022-09-22 DIAGNOSIS — Z87891 Personal history of nicotine dependence: Secondary | ICD-10-CM | POA: Insufficient documentation

## 2022-09-22 DIAGNOSIS — R079 Chest pain, unspecified: Secondary | ICD-10-CM

## 2022-09-22 DIAGNOSIS — I5022 Chronic systolic (congestive) heart failure: Secondary | ICD-10-CM | POA: Diagnosis not present

## 2022-09-22 DIAGNOSIS — N184 Chronic kidney disease, stage 4 (severe): Secondary | ICD-10-CM | POA: Diagnosis not present

## 2022-09-22 DIAGNOSIS — R0789 Other chest pain: Secondary | ICD-10-CM | POA: Diagnosis not present

## 2022-09-22 DIAGNOSIS — G4733 Obstructive sleep apnea (adult) (pediatric): Secondary | ICD-10-CM | POA: Diagnosis present

## 2022-09-22 DIAGNOSIS — E119 Type 2 diabetes mellitus without complications: Secondary | ICD-10-CM

## 2022-09-22 DIAGNOSIS — L0231 Cutaneous abscess of buttock: Secondary | ICD-10-CM | POA: Diagnosis not present

## 2022-09-22 LAB — BASIC METABOLIC PANEL
Anion gap: 8 (ref 5–15)
BUN: 73 mg/dL — ABNORMAL HIGH (ref 8–23)
CO2: 17 mmol/L — ABNORMAL LOW (ref 22–32)
Calcium: 7.9 mg/dL — ABNORMAL LOW (ref 8.9–10.3)
Chloride: 109 mmol/L (ref 98–111)
Creatinine, Ser: 2.79 mg/dL — ABNORMAL HIGH (ref 0.61–1.24)
GFR, Estimated: 24 mL/min — ABNORMAL LOW (ref >60)
Glucose, Bld: 109 mg/dL — ABNORMAL HIGH (ref 70–99)
Potassium: 3.8 mmol/L (ref 3.5–5.1)
Sodium: 134 mmol/L — ABNORMAL LOW (ref 135–145)

## 2022-09-22 LAB — CBC
HCT: 29.4 % — ABNORMAL LOW (ref 39.0–52.0)
Hemoglobin: 9.1 g/dL — ABNORMAL LOW (ref 13.0–17.0)
MCH: 26.8 pg (ref 26.0–34.0)
MCHC: 31 g/dL (ref 30.0–36.0)
MCV: 86.7 fL (ref 80.0–100.0)
Platelets: 159 10*3/uL (ref 150–400)
RBC: 3.39 MIL/uL — ABNORMAL LOW (ref 4.22–5.81)
RDW: 15.9 % — ABNORMAL HIGH (ref 11.5–15.5)
WBC: 4.3 10*3/uL (ref 4.0–10.5)
nRBC: 0 % (ref 0.0–0.2)

## 2022-09-22 LAB — TROPONIN I (HIGH SENSITIVITY)
Troponin I (High Sensitivity): 16 ng/L (ref <18)
Troponin I (High Sensitivity): 17 ng/L (ref <18)

## 2022-09-22 LAB — D-DIMER, QUANTITATIVE: D-Dimer, Quant: 1.77 ug/mL-FEU — ABNORMAL HIGH (ref 0.00–0.50)

## 2022-09-22 MED ORDER — LIDOCAINE VISCOUS HCL 2 % MT SOLN
15.0000 mL | Freq: Once | OROMUCOSAL | Status: AC
  Administered 2022-09-22: 15 mL via ORAL
  Filled 2022-09-22: qty 15

## 2022-09-22 MED ORDER — ALUM & MAG HYDROXIDE-SIMETH 200-200-20 MG/5ML PO SUSP
30.0000 mL | Freq: Once | ORAL | Status: AC
  Administered 2022-09-22: 30 mL via ORAL
  Filled 2022-09-22: qty 30

## 2022-09-22 NOTE — Progress Notes (Signed)
BINYOMIN, BRANN ([id number]) [id number] Page 1 of 7 Visit Report for 09/22/2022 Arrival Information Details Patient Name: Date of Service: Larry Callahan MES F. 09/22/2022 1:30 PM Medical Record Number: [id number] Patient Account Number: [id number] Date of Birth/Sex: Treating RN: 03-Nov-1955 (67 y.o. Larry Callahan) Yevonne Pax Primary Care [place] Derks: Sallyanne Kuster Other Clinician: Referring Hava Massingale: Treating Slevin Gunby/Extender: Weldon Inches Weeks in Treatment: 5 Visit Information History Since Last Visit Added or deleted any medications: No Patient Arrived: Ambulatory Any new allergies or adverse reactions: No Arrival Time: 13:39 Had a fall or experienced change in No Accompanied By: self activities of daily living that may affect Transfer Assistance: None risk of falls: Patient Identification Verified: Yes Signs or symptoms of abuse/neglect since last visito No Secondary Verification Process Completed: Yes Hospitalized since last visit: No Patient Requires Transmission-Based Precautions: No Implantable device outside of the clinic excluding No Patient Has Alerts: Yes cellular tissue based products placed in the center Patient Alerts: Patient on Blood Thinner since last visit: Has Dressing in Place as Prescribed: Yes Pain Present Now: No Electronic Signature(s) Signed: 09/22/2022 4:28:11 PM By: Yevonne Pax RN Entered By: Yevonne Pax on 09/22/2022 13:39:50 -------------------------------------------------------------------------------- Clinic Level of Care Assessment Details Patient Name: Date of Service: Larry Callahan MES F. 09/22/2022 1:30 PM Medical Record Number: [id number] Patient Account Number: [id number] Date of Birth/Sex: Treating RN: 1955/10/17 (67 y.o. Larry Callahan) Yevonne Pax Primary Care Lakeyta Vandenheuvel: Sallyanne Kuster Other Clinician: Referring Chanah Tidmore: Treating Emony Dormer/Extender: Weldon Inches Weeks in Treatment: 5 Clinic  Level of Care Assessment Items TOOL 4 Quantity Score X- 1 0 Use when only an EandM is performed on FOLLOW-UP visit ASSESSMENTS - Nursing Assessment / Reassessment X- 1 10 Reassessment of Co-morbidities (includes updates in patient status) X- 1 5 Reassessment of Adherence to Treatment Plan ASSESSMENTS - Wound and Skin A ssessment / Reassessment X - Simple Wound Assessment / Reassessment - one wound 1 5  - 0 Complex Wound Assessment / Reassessment - multiple wounds  - 0 Dermatologic / Skin Assessment (not related to wound area) ASSESSMENTS - Focused Assessment  - 0 Circumferential Edema Measurements - multi extremities  - 0 Nutritional Assessment / Counseling / Intervention  - 0 Lower Extremity Assessment (monofilament, tuning fork, pulses)  - 0 Peripheral Arterial Disease Assessment (using hand held doppler) ASSESSMENTS - Ostomy and/or Continence Assessment and Care  - 0 Incontinence Assessment and Management  - 0 Ostomy Care Assessment and Management (repouching, etc.) PROCESS - Coordination of Care X - Simple Patient / Family Education for ongoing care 1 [address] [place] F ([id number]) [id number] Page 2 of 7  - 0 Complex (extensive) Patient / Family Education for ongoing care  - 0 Staff obtains Chiropractor, Records, T Results / Process Orders est  - 0 Staff telephones HHA, Nursing Homes / Clarify orders / etc  - 0 Routine Transfer to another Facility (non-emergent condition)  - 0 Routine Hospital Admission (non-emergent condition)  - 0 New Admissions / Manufacturing engineer / Ordering NPWT Apligraf, etc. ,  - 0 Emergency Hospital Admission (emergent condition) X- 1 10 Simple Discharge Coordination  - 0 Complex (extensive) Discharge Coordination PROCESS - Special Needs  - 0 Pediatric / Minor Patient Management  - 0 Isolation Patient Management  - 0 Hearing / Language / Visual special needs  -  0 Assessment of Community assistance (transportation, D/C planning, etc.)  - 0 Additional assistance / Altered mentation  - 0 Support Surface(s) Assessment (bed, cushion, seat, etc.) INTERVENTIONS - Wound Cleansing /  Measurement X - Simple Wound Cleansing - one wound 1 5 []  - 0 Complex Wound Cleansing - multiple wounds X- 1 5 Wound Imaging (photographs - any number of wounds) []  - 0 Wound Tracing (instead of photographs) X- 1 5 Simple Wound Measurement - one wound []  - 0 Complex Wound Measurement - multiple wounds INTERVENTIONS - Wound Dressings X - Small Wound Dressing one or multiple wounds 1 10 []  - 0 Medium Wound Dressing one or multiple wounds []  - 0 Large Wound Dressing one or multiple wounds []  - 0 Application of Medications - topical []  - 0 Application of Medications - injection INTERVENTIONS - Miscellaneous []  - 0 External ear exam []  - 0 Specimen Collection (cultures, biopsies, blood, body fluids, etc.) []  - 0 Specimen(s) / Culture(s) sent or taken to Lab for analysis []  - 0 Patient Transfer (multiple staff / Nurse, adult / Similar devices) []  - 0 Simple Staple / Suture removal (25 or less) []  - 0 Complex Staple / Suture removal (26 or more) []  - 0 Hypo / Hyperglycemic Management (close monitor of Blood Glucose) []  - 0 Ankle / Brachial Index (ABI) - do not check if billed separately X- 1 5 Vital Signs Has the patient been seen at the hospital within the last three years: Yes Total Score: 75 Level Of Care: New/Established - Level 2 Electronic Signature(s) Signed: 09/22/2022 4:28:11 PM By: Yevonne Pax RN Entered By: Yevonne Pax on 09/22/2022 14:12:21 [place] ([id number]) [id number] Page 3 of 7 -------------------------------------------------------------------------------- Encounter Discharge Information Details Patient Name: Date of Service: Larry Callahan MES F. 09/22/2022 1:30 PM Medical Record Number:  [id number] Patient Account Number: [id number] Date of Birth/Sex: Treating RN: [birth year] (67 y.o. Larry Callahan) Yevonne Pax Primary Care [place]: Sallyanne Kuster Other Clinician: Referring Damascus Feldpausch: Treating Afiya Ferrebee/Extender: Weldon Inches Weeks in Treatment: 5 Encounter Discharge Information Items Discharge Condition: Stable Ambulatory Status: Ambulatory Discharge Destination: Home Transportation: Private Auto Accompanied By: self Schedule Follow-up Appointment: Yes Clinical Summary of Care: Electronic Signature(s) Signed: 09/22/2022 2:13:16 PM By: Yevonne Pax RN Entered By: Yevonne Pax on 09/22/2022 14:13:16 -------------------------------------------------------------------------------- Lower Extremity Assessment Details Patient Name: Date of Service: Larry Callahan MES F. 09/22/2022 1:30 PM Medical Record Number: [id number] Patient Account Number: [id number] Date of Birth/Sex: Treating RN: Dec 18, 1955 (67 y.o. Larry Callahan Primary Care Breaunna Gottlieb: Sallyanne Kuster Other Clinician: Referring Harvie Morua: Treating Diedre Maclellan/Extender: Weldon Inches Weeks in Treatment: 5 Electronic Signature(s) Signed: 09/22/2022 4:28:11 PM By: Yevonne Pax RN Entered By: Yevonne Pax on 09/22/2022 13:44:24 -------------------------------------------------------------------------------- Multi Wound Chart Details Patient Name: Date of Service: Larry Callahan MES F. 09/22/2022 1:30 PM Medical Record Number: [id number] Patient Account Number: [id number] Date of Birth/Sex: Treating RN: 1955/11/02 (67 y.o. Larry Callahan) Yevonne Pax Primary Care Tracen Mahler: Sallyanne Kuster Other Clinician: Referring Jaylanni Eltringham: Treating Kaidence Callaway/Extender: Weldon Inches Weeks in Treatment: 5 Vital Signs Height(in): 71 Pulse(bpm): 81 Weight(lbs): 229 Blood Pressure(mmHg): 172/83 Body Mass Index(BMI): 31.9 Temperature(F): 97.5 Respiratory Rate(breaths/min): 18 [2:Photos:]  [N/A:N/A] ROREY, RAUF ([id number]) [2:Right Gluteus Wound Location: Gradually Appeared Wounding Event: Abscess Primary Etiology: Chronic Obstructive Pulmonary Comorbid History: Disease (COPD), Arrhythmia, Coronary Artery Disease, Hypertension, Type II Diabetes 08/29/2022 Date Acquired: 3  Weeks of Treatment: Open Wound Status: No Wound Recurrence: 2.5x2.7x0.4 Measurements L x W x D (cm) 5.301 A (cm) : rea 2.121 Volume (cm) : 22.90% % Reduction in Area: -2.90% % Reduction in Volume: Full Thickness Without Exposed Classification: Support  Structures Medium Exudate A mount: Serosanguineous Exudate Type: red, brown Exudate  Color: None Present (0%) Granulation Amount: Large (67-100%) Necrotic Amount: Eschar, Adherent Slough Necrotic Tissue: Fascia: No Exposed Structures: Fat Layer  (Subcutaneous Tissue): No Tendon: No Muscle: No Joint: No Bone: No None Epithelialization:] [N/A:N/A N/A N/A N/A N/A N/A N/A N/A N/A N/A N/A N/A N/A N/A N/A N/A N/A N/A N/A N/A N/A N/A] Treatment Notes Electronic Signature(s) Signed: 09/22/2022 4:28:11 PM By: Yevonne PaxEpps, Carrie RN Entered By: Yevonne PaxEpps, Carrie on 09/22/2022 13:45:01 -------------------------------------------------------------------------------- Multi-Disciplinary Care Plan Details Patient Name: Date of Service: Larry LinkIGGA NS, JA MES F. 09/22/2022 1:30 PM Medical Record Number: [id number] Patient Account Number: [id number] Date of Birth/Sex: Treating RN: 03-21-1956 (67 y.o. Larry PetitM) Yevonne PaxEpps, Carrie Primary Care Jaymond Waage: Sallyanne KusterAbernathy, Alyssa Other Clinician: Referring Faron Whitelock: Treating Fae Blossom/Extender: Weldon InchesStone, Hoyt Abernathy, Alyssa Weeks in Treatment: 5 Active Inactive Necrotic Tissue Nursing Diagnoses: Knowledge deficit related to management of necrotic/devitalized tissue Goals: Necrotic/devitalized tissue will be minimized in the wound bed Date Initiated: 09/01/2022 Target Resolution Date: 10/02/2022 Goal Status: Active Interventions: Assess patient pain level pre-,  during and post procedure and prior to discharge Provide education on necrotic tissue and debridement process Notes: Electronic Signature(s) Signed: 09/22/2022 4:28:11 PM By: Yevonne PaxEpps, Carrie RN Entered By: Yevonne PaxEpps, Carrie on 09/22/2022 13:45:10 [place], [place] F ([id number]) [id number] Page 5 of 7 -------------------------------------------------------------------------------- Pain Assessment Details Patient Name: Date of Service: Larry LinkRIGGA NS, JA MES F. 09/22/2022 1:30 PM Medical Record Number: [id number] Patient Account Number: [id number] Date of Birth/Sex: Treating RN: 03-21-1956 (67 y.o. Larry PetitM) Yevonne PaxEpps, Carrie Primary Care Verlene Glantz: Sallyanne KusterAbernathy, Alyssa Other Clinician: Referring Winifred Bodiford: Treating Meiya Wisler/Extender: Weldon InchesStone, Hoyt Abernathy, Alyssa Weeks in Treatment: 5 Active Problems Location of Pain Severity and Description of Pain Patient Has Paino No Site Locations Pain Management and Medication Current Pain Management: Electronic Signature(s) Signed: 09/22/2022 4:28:11 PM By: Yevonne PaxEpps, Carrie RN Entered By: Yevonne PaxEpps, Carrie on 09/22/2022 13:40:24 -------------------------------------------------------------------------------- Patient/Caregiver Education Details Patient Name: Date of Service: Larry LinkIGGA NS, JA MES F. 4/11/2024andnbsp1:30 PM Medical Record Number: [id number] Patient Account Number: [id number] Date of Birth/Gender: Treating RN: 03-21-1956 (67 y.o. Larry FloridaM) Epps, Carrie Primary Care Physician: Sallyanne KusterAbernathy, Alyssa Other Clinician: Referring Physician: Treating Physician/Extender: Weldon InchesStone, Hoyt Abernathy, Alyssa Weeks in Treatment: 5 Education Assessment Education Provided To: Patient Education Topics Provided Wound/Skin Impairment: Methods: Explain/Verbal Responses: State content correctly Electronic Signature(s) Signed: 09/22/2022 4:28:11 PM By: Yevonne PaxEpps, Carrie RN Entered By: Yevonne PaxEpps, Carrie on 09/22/2022 13:45:20 Wound Assessment  Details -------------------------------------------------------------------------------- [place]ock[place]GANS, Jaquon F ([id number]) [id number] Page 6 of 7 Patient Name: Date of Service: Larry LinkRIGGA NS, JA MES F. 09/22/2022 1:30 PM Medical Record Number: [id number] Patient Account Number: [id number] Date of Birth/Sex: Treating RN: 03-21-1956 (67 y.o. Larry PetitM) Yevonne PaxEpps, Carrie Primary Care [place]: Sallyanne KusterAbernathy, Alyssa Other Clinician: Referring Santiago Graf: Treating Grove Defina/Extender: Weldon InchesStone, Hoyt Abernathy, Alyssa Weeks in Treatment: 5 Wound Status Wound Number: 2 Primary Abscess Etiology: Wound Location: Right Gluteus Wound Open Wounding Event: Gradually Appeared Status: Date Acquired: 08/29/2022 Comorbid Chronic Obstructive Pulmonary Disease (COPD), Arrhythmia, Weeks Of Treatment: 3 History: Coronary Artery Disease, Hypertension, Type II Diabetes Clustered Wound: No Photos Wound Measurements Length: (cm) 2.5 % Reduction in Area: 22.9% Width: (cm) 2.7 % Reduction in Volume: -2.9% Depth: (cm) 0.4 Epithelialization: None Area: (cm) 5.301 Tunneling: No Volume: (cm) 2.121 Undermining: No Wound Description Classification: Full Thickness Without Exposed Support Structures Foul Odor After Cleansing: No Exudate Amount: Medium Slough/Fibrino Yes Exudate Type: Serosanguineous Exudate Color: red, brown Wound Bed Granulation Amount: None Present (0%) Exposed Structure Necrotic Amount: Large (67-100%) Fascia Exposed: No Necrotic Quality: Eschar, Adherent Slough Fat Layer (Subcutaneous Tissue) Exposed: No Tendon Exposed: No Muscle Exposed:  No Joint Exposed: No Bone Exposed: No Treatment Notes Wound #2 (Gluteus) Wound Laterality: Right Cleanser Soap and Water Discharge Instruction: Gently cleanse wound with antibacterial soap, rinse and pat dry prior to dressing wounds Peri-Wound Care Topical Primary Dressing Hydrofera Blue Ready Transfer Foam, 2.5x2.5 (in/in) Discharge  Instruction: Apply Hydrofera Blue Ready to wound bed as directed Secondary Dressing ABD Pad 5x9 (in/in) Discharge Instruction: cut to fit over HB T Adhesive [employer], 4x4 (in/in) elfa Discharge Instruction: Apply over dressing to secure in place. TAFARI, CHUNG ([id number]) [id number] Page 7 of 7 Secured With Compression Wrap Compression Stockings Facilities manager) Signed: 09/22/2022 4:28:11 PM By: Yevonne Pax RN Entered By: Yevonne Pax on 09/22/2022 13:44:12 -------------------------------------------------------------------------------- Vitals Details Patient Name: Date of Service: Tracie Harrier, Fawn Kirk MES F. 09/22/2022 1:30 PM Medical Record Number: [id number] Patient Account Number: [id number] Date of Birth/Sex: Treating RN: 1955-11-21 (67 y.o. Larry Callahan) Yevonne Pax Primary Care [place]: Sallyanne Kuster Other Clinician: Referring Levana Minetti: Treating Kenner Lewan/Extender: Weldon Inches Weeks in Treatment: 5 Vital Signs Time Taken: 13:35 Temperature (F): 97.5 Height (in): 71 Pulse (bpm): 81 Weight (lbs): 229 Respiratory Rate (breaths/min): 18 Body Mass Index (BMI): 31.9 Blood Pressure (mmHg): 172/83 Reference Range: 80 - 120 mg / dl Electronic Signature(s) Signed: 09/22/2022 4:28:11 PM By: Yevonne Pax RN Entered By: Yevonne Pax on 09/22/2022 13:40:16

## 2022-09-22 NOTE — ED Provider Notes (Signed)
[facility] Provider Note    Event Date/Time   First MD Initiated Contact with Patient 09/22/22 1822     (approximate)   History   Constipation, Leg Swelling, and Shortness of Breath   HPI  Larry Callahan is a 67 y.o. male who presents to the emergency department with a myriad medical complaints however primary concern for chest pain.  Located in the center chest.  He describes it as burning.  Has been accompanied by some shortness of breath as well as nausea and vomiting.  He has noticed a small amount of blood in the vomit.  Patient states he has had similar pain in the past but never this severe.  Additionally the patient complains of extremity discomfort, constipation with last bowel movement yesterday and bruising from his     Physical Exam   Triage Vital Signs: ED Triage Vitals  Enc Vitals Group     BP 09/22/22 1536 (!) 160/90     Pulse Rate 09/22/22 1536 78     Resp 09/22/22 1540 (!) 22     Temp 09/22/22 1536 97.8 F (36.6 C)     Temp Source 09/22/22 1536 Oral     SpO2 09/22/22 1536 97 %     Weight 09/22/22 1537 200 lb (90.7 kg)     Height 09/22/22 1537 5\' 11"  (1.803 m)     Head Circumference --      Peak Flow --      Pain Score 09/22/22 1537 0     Pain Loc --      Pain Edu? --      Excl. in GC? --     Most recent vital signs: Vitals:   09/22/22 2000 09/22/22 2100  BP: (!) 151/88 (!) 171/84  Pulse: 75 73  Resp:    Temp:    SpO2: 100% 99%    General: Awake, alert, oriented. CV:  Good peripheral perfusion. Regular rate and rhythm. Resp:  Normal effort. Lungs clear. Abd:  No distention.    ED Results / Procedures / Treatments   Labs (all labs ordered are listed, but only abnormal results are displayed) Labs Reviewed  BASIC METABOLIC PANEL - Abnormal; Notable for the following components:      Result Value   Sodium 134 (*)    CO2 17 (*)    Glucose, Bld 109 (*)    BUN 73 (*)    Creatinine, Ser 2.79 (*)    Calcium  7.9 (*)    GFR, Estimated 24 (*)    All other components within normal limits  CBC - Abnormal; Notable for the following components:   RBC 3.39 (*)    Hemoglobin 9.1 (*)    HCT 29.4 (*)    RDW 15.9 (*)    All other components within normal limits  TROPONIN I (HIGH SENSITIVITY)  TROPONIN I (HIGH SENSITIVITY)     EKG  I, Phineas SemenGraydon Farid Grigorian, attending physician, personally viewed and interpreted this EKG  EKG Time: 1536 Rate: 80 Rhythm: atrial flutter Axis: normal Intervals: qtc 445 QRS: narrow ST changes: no st elevation Impression: abnormal ekg   RADIOLOGY I independently interpreted and visualized the CXR. My interpretation: No pneumonia Radiology interpretation:  IMPRESSION:  No active cardiopulmonary disease.      PROCEDURES:  Critical Care performed: No   MEDICATIONS ORDERED IN ED: Medications - No data to display   IMPRESSION / MDM / ASSESSMENT AND PLAN / ED COURSE  I reviewed the triage  vital signs and the nursing notes.                              Differential diagnosis includes, but is not limited to, pneumonia, PE, PTX, ACS  Patient's presentation is most consistent with acute presentation with potential threat to life or bodily function.   The patient is on the cardiac monitor to evaluate for evidence of arrhythmia and/or significant heart rate changes.  Patient presented to the urgency department today with primary concern for chest pain.  Patient also complaining of shortness of breath, leg swelling.  Also is unclear if he is coughing up blood or vomiting of blood.  Patient is a somewhat poor historian.  Chest x-ray without any pneumonia or pneumothorax.  Blood work did show elevated D-dimer.  Unfortunately given poor kidney function unable to obtain CT angio.  Will plan on admission for VQ scan.  Additionally patient's blood level did drop from most recent check although looking back further in his blood work it does seem it is closer to his  baseline. Discussed with Dr. Para March who will plan on admission.     FINAL CLINICAL IMPRESSION(S) / ED DIAGNOSES   Final diagnoses:  Nonspecific chest pain      Note:  This document was prepared using Dragon voice recognition software and may include unintentional dictation errors.    Phineas Semen, MD 09/23/22 1630

## 2022-09-22 NOTE — Telephone Encounter (Addendum)
Pt called that he went to wound clinic today he is having chest pain his leg swelling,wound on Butt,No bowel movement  and sob as per Alyssa FNP advised he go to ED also he can call his cardiology mean while also alyssa spoke with him and advised him to go to ED

## 2022-09-22 NOTE — Telephone Encounter (Signed)
Spoke to patient keeping appt

## 2022-09-22 NOTE — Progress Notes (Addendum)
DAILEY, BUCCHERI ([id number]) [id number] Page 1 of 5 Visit Report for 09/22/2022 Chief Complaint Document Details Patient Name: Date of Service: Larry Callahan. 09/22/2022 1:30 PM Medical Record Number: [id number] Patient Account Number: [id number] Date of Birth/Sex: Treating RN: 12-09-1955 (67 y.o. [name]) Yevonne Pax Primary Care Provider: Sallyanne Kuster Other Clinician: Referring Provider: Treating Provider/Extender: Weldon Inches Weeks in Treatment: 5 Information Obtained from: Patient Chief Complaint Right gluteal abscess Electronic Signature(s) Signed: 09/22/2022 1:55:24 PM By: Allen Derry PA-C Entered By: Allen Derry on 09/22/2022 13:55:24 -------------------------------------------------------------------------------- HPI Details Patient Name: Date of Service: Larry Callahan. 09/22/2022 1:30 PM Medical Record Number: [id number] Patient Account Number: [id number] Date of Birth/Sex: Treating RN: [birth year] (67 y.o. Larry Callahan Primary Care Provider: Sallyanne Kuster Other Clinician: Referring Provider: Treating Provider/Extender: Weldon Inches Weeks in Treatment: 5 History of Present Illness HPI Description: ADMISSION 08/15/2022 This is a 68 year old man who came in with very little medical information. There was some suggestion that he was in the hospital last month with cellulitis of the arm I will need to check this out thoroughly. He tells me that he received 2 courses of antibiotics last month were not really sure which ones they were but they are completed now. He is not really having any discomfort. His sister who lives nearby is dressing this with what sounds like Coban. I looked through [facility] health link. He was in the hospital about 3 weeks ago for an infection of the right elbow. Prior to that he had been in an urgent care receiving Keflex. I do not think he was admitted to hospital but may have spent  some time in the ER. I do not see any cultures. 3/11; patient presents for follow-up. He has been using Hydrofera Blue to the right elbow. The wound has healed. Readmission: 09-01-2022 upon evaluation today patient presents for reevaluation here in the clinic he actually was previously seen for his elbow today he comes in with the elbow okay but unfortunately a right gluteal abscess. Unfortunately he tells me that this is somewhat painful. He was seen in the ER they gave him doxycycline he has not picked that up yet that was just yesterday. 09-08-2022 upon evaluation today patient's wound actually appears to be excellent. In fact he called in stating that was bleeding profusely have a note in communications concerning that. Nonetheless the home health nurse and reassured me he was doing okay and this appears to be doing just fine today as well. I think it is actually showing less pressure compared to last time I saw him as far as the overall surrounding tissue and how things appear. 09-15-2022 upon evaluation today patient's wound unfortunately appears to be infected. I do believe that he is going to require intervention as far as antibiotics are concerned I am actually going to grab a wound culture today as well. 09-22-2022 upon evaluation today patient appears to be doing better in regard to his wound and actually very pleased with where things stand. Fortunately I do think that he is moving in the right direction. No fevers, chills, nausea, vomiting, or diarrhea. Electronic Signature(s) Signed: 09/22/2022 2:12:49 PM By: Allen Derry PA-C Entered By: Allen Derry on 09/22/2022 14:12:49 -------------------------------------------------------------------------------- Physical Exam Details Patient Name: Date of Service: Larry Callahan, Larry Callahan. 09/22/2022 1:30 PM Larry Callahan, Larry Callahan ([id number]) [id number] Page 2 of 5 Medical Record Number: [id number] Patient Account Number:  [id number] Date of Birth/Sex: Treating RN: 08-22-1955 (67 y.o.  Larry Callahan Primary Care Provider: Sallyanne Kuster Other Clinician: Referring Provider: Treating Provider/Extender: Weldon Inches Weeks in Treatment: 5 Constitutional Obese and well-hydrated in no acute distress. Respiratory normal breathing without difficulty. Psychiatric this patient is able to make decisions and demonstrates good insight into disease process. Alert and Oriented x 3. pleasant and cooperative. Notes Upon inspection patient's wound bed actually showed signs of good granulation and epithelization at this point. Fortunately I do not see any signs of infection locally nor systemically which is great news. No fevers, chills, nausea, vomiting, or diarrhea. I do think he is making good progress. The Levaquin obviously is helping. Electronic Signature(s) Signed: 09/22/2022 2:13:21 PM By: Allen Derry PA-C Entered By: Allen Derry on 09/22/2022 14:13:21 -------------------------------------------------------------------------------- Physician Orders Details Patient Name: Date of Service: Larry Callahan. 09/22/2022 1:30 PM Medical Record Number: [id number] Patient Account Number: [id number] Date of Birth/Sex: Treating RN: 18-May-1956 (67 y.o. [name]) Yevonne Pax Primary Care Provider: Sallyanne Kuster Other Clinician: Referring Provider: Treating Provider/Extender: Weldon Inches Weeks in Treatment: 5 Verbal / Phone Orders: No Diagnosis Coding ICD-10 Coding Code Description L02.31 Cutaneous abscess of buttock Follow-up Appointments Return Appointment in 1 week. Home Health [facility] for wound care. May utilize formulary equivalent dressing for wound treatment orders unless otherwise specified. Home Health Nurse may visit PRN to address patients wound care needs. Rolene Arbour [phone] Anesthetic (Use 'Patient Medications' Section for Anesthetic Order  Entry) Lidocaine applied to wound bed Edema Control - Lymphedema / Segmental Compressive Device / Other Elevate, Exercise Daily and A void Standing for Long Periods of Time. Elevate legs to the level of the heart and pump ankles as often as possible Elevate leg(s) parallel to the floor when sitting. Medications-Please add to medication list. ntibiotics - start levoquin P.O. A Wound Treatment Wound #2 - Gluteus Wound Laterality: Right Cleanser: Soap and Water 3 x Per Week/15 Days Discharge Instructions: Gently cleanse wound with antibacterial soap, rinse and pat dry prior to dressing wounds Prim Dressing: Hydrofera Blue Ready Transfer Foam, 2.5x2.5 (in/in) (Generic) 3 x Per Week/15 Days ary Discharge Instructions: Apply Hydrofera Blue Ready to wound bed as directed Secondary Dressing: ABD Pad 5x9 (in/in) 3 x Per Week/15 Days Discharge Instructions: cut to fit over HB Secondary Dressing: T Adhesive [employer], 4x4 (in/in) (Generic) 3 x Per Week/15 Days elfa Discharge Instructions: Apply over dressing to secure in place. Larry Callahan, Larry Callahan ([id number]) [id number] Page 3 of 5 Electronic Signature(s) Signed: 09/22/2022 4:28:11 PM By: Yevonne Pax RN Signed: 09/22/2022 10:26:23 PM By: Allen Derry PA-C Entered By: Yevonne Pax on 09/22/2022 14:10:35 -------------------------------------------------------------------------------- Problem List Details Patient Name: Date of Service: Larry Callahan. 09/22/2022 1:30 PM Medical Record Number: [id number] Patient Account Number: [id number] Date of Birth/Sex: Treating RN: [birth year] (67 y.o. Larry Callahan Primary Care Provider: Sallyanne Kuster Other Clinician: Referring Provider: Treating Provider/Extender: Weldon Inches Weeks in Treatment: 5 Active Problems ICD-10 Encounter Code Description Active Date MDM Diagnosis L02.31 Cutaneous abscess of buttock 08/15/2022 No Yes Inactive  Problems Resolved Problems Electronic Signature(s) Signed: 09/22/2022 1:55:20 PM By: Allen Derry PA-C Entered By: Allen Derry on 09/22/2022 13:55:20 -------------------------------------------------------------------------------- Progress Note Details Patient Name: Date of Service: Larry Callahan. 09/22/2022 1:30 PM Medical Record Number: [id number] Patient Account Number: [id number] Date of Birth/Sex: Treating RN: [birth year] (67 y.o. Larry Callahan Primary Care Provider: Sallyanne Kuster Other Clinician: Referring Provider: Treating Provider/Extender: Weldon Inches Weeks in Treatment: 5 Subjective  Chief Complaint Information obtained from Patient Right gluteal abscess History of Present Illness (HPI) ADMISSION 08/15/2022 This is a 67 year old man who came in with very little medical information. There was some suggestion that he was in the hospital last month with cellulitis of the arm I will need to check this out thoroughly. He tells me that he received 2 courses of antibiotics last month were not really sure which ones they were but they are completed now. He is not really having any discomfort. His sister who lives nearby is dressing this with what sounds like Coban. [facility]. He was in the hospital about 3 weeks ago for an infection of the right elbow. Prior to that he had been in an urgent care receiving Keflex. I do not think he was admitted to hospital but may have spent some time in the ER. I do not see any cultures. 3/11; patient presents for follow-up. He has been using Hydrofera Blue to the right elbow. The wound has healed. Readmission: 09-01-2022 upon evaluation today patient presents for reevaluation here in the clinic he actually was previously seen for his elbow today he comes in with the elbow okay but unfortunately a right gluteal abscess. Unfortunately he tells me that this is somewhat painful. He was seen in the ER they  gave him doxycycline he has not picked that up yet that was just yesterday. 09-08-2022 upon evaluation today patient's wound actually appears to be excellent. In fact he called in stating that was bleeding profusely have a note in communications concerning that. Nonetheless the home health nurse and reassured me he was doing okay and this appears to be doing just fine today as well. [place]IGG[place], Larry Callahan ([id number]) [id number] Page 4 of 5 I think it is actually showing less pressure compared to last time I saw him as far as the overall surrounding tissue and how things appear. 09-15-2022 upon evaluation today patient's wound unfortunately appears to be infected. I do believe that he is going to require intervention as far as antibiotics are concerned I am actually going to grab a wound culture today as well. 09-22-2022 upon evaluation today patient appears to be doing better in regard to his wound and actually very pleased with where things stand. Fortunately I do think that he is moving in the right direction. No fevers, chills, nausea, vomiting, or diarrhea. Objective Constitutional Obese and well-hydrated in no acute distress. Vitals Time Taken: 1:35 PM, Height: 71 in, Weight: 229 lbs, BMI: 31.9, Temperature: 97.5 Callahan, Pulse: 81 bpm, Respiratory Rate: 18 breaths/min, Blood Pressure: 172/83 mmHg. Respiratory normal breathing without difficulty. Psychiatric this patient is able to make decisions and demonstrates good insight into disease process. Alert and Oriented x 3. pleasant and cooperative. General Notes: Upon inspection patient's wound bed actually showed signs of good granulation and epithelization at this point. Fortunately I do not see any signs of infection locally nor systemically which is great news. No fevers, chills, nausea, vomiting, or diarrhea. I do think he is making good progress. The Levaquin obviously is helping. Integumentary (Hair, Skin) Wound #2  status is Open. Original cause of wound was Gradually Appeared. The date acquired was: 08/29/2022. The wound has been in treatment 3 weeks. The wound is located on the Right Gluteus. The wound measures 2.5cm length x 2.7cm width x 0.4cm depth; 5.301cm^2 area and 2.121cm^3 volume. There is no tunneling or undermining noted. There is a medium amount of serosanguineous drainage noted. There is no granulation  within the wound bed. There is a large (67-100%) amount of necrotic tissue within the wound bed including Eschar and Adherent Slough. Assessment Active Problems ICD-10 Cutaneous abscess of buttock Plan Follow-up Appointments: Return Appointment in 1 week. Home Health: [facility] for wound care. May utilize formulary equivalent dressing for wound treatment orders unless otherwise specified. Home Health Nurse may visit PRN to address patientoos wound care needs. Rolene Arbour [phone] Anesthetic (Use 'Patient Medications' Section for Anesthetic Order Entry): Lidocaine applied to wound bed Edema Control - Lymphedema / Segmental Compressive Device / Other: Elevate, Exercise Daily and Avoid Standing for Long Periods of Time. Elevate legs to the level of the heart and pump ankles as often as possible Elevate leg(s) parallel to the floor when sitting. Medications-Please add to medication list.: P.O. Antibiotics - start levoquin WOUND #2: - Gluteus Wound Laterality: Right Cleanser: Soap and Water 3 x Per Week/15 Days Discharge Instructions: Gently cleanse wound with antibacterial soap, rinse and pat dry prior to dressing wounds Prim Dressing: Hydrofera Blue Ready Transfer Foam, 2.5x2.5 (in/in) (Generic) 3 x Per Week/15 Days ary Discharge Instructions: Apply Hydrofera Blue Ready to wound bed as directed Secondary Dressing: ABD Pad 5x9 (in/in) 3 x Per Week/15 Days Discharge Instructions: cut to fit over HB Secondary Dressing: T Adhesive [employer], 4x4 (in/in) (Generic) 3 x Per  Week/15 Days elfa Discharge Instructions: Apply over dressing to secure in place. 1. Based on what I am seeing I do believe that the patient is making good progress here. I am very pleased with where we stand and I do think that the medication is helping him tremendously. 2. Recommend continue with the [facility] which I think is doing a good job. Larry Callahan, Larry Callahan ([id number]) [id number] Page 5 of 5 3. I am also going to suggest that the patient should continue to offload is much as possible to help with pressure relief obviously I think this is still going to be of utmost importance to continue. We will see patient back for reevaluation in 1 week here in the clinic. If anything worsens or changes patient will contact our office for additional recommendations. Electronic Signature(s) Signed: 09/22/2022 2:13:41 PM By: Allen Derry PA-C Entered By: Allen Derry on 09/22/2022 14:13:41 -------------------------------------------------------------------------------- SuperBill Details Patient Name: Date of Service: Larry Callahan, Larry Callahan. 09/22/2022 Medical Record Number: [id number] Patient Account Number: [id number] Date of Birth/Sex: Treating RN: 30-May-1956 (67 y.o. [name]) Yevonne Pax Primary Care Provider: Sallyanne Kuster Other Clinician: Referring Provider: Treating Provider/Extender: Weldon Inches Weeks in Treatment: 5 Diagnosis Coding ICD-10 Codes Code Description L02.31 Cutaneous abscess of buttock Facility Procedures : CPT4 Code: [id number] Description: [phone] - WOUND CARE VISIT-LEV 2 EST PT Modifier: Quantity: 1 Physician Procedures : CPT4 Code Description Modifier [id number] - WC PHYS LEVEL 3 - EST PT ICD-10 Diagnosis Description L02.31 Cutaneous abscess of buttock Quantity: 1 Electronic Signature(s) Signed: 09/22/2022 2:13:51 PM By: Allen Derry PA-C Previous Signature: 09/22/2022 2:12:27 PM Version By: Yevonne Pax RN Entered By:  Allen Derry on 09/22/2022 14:13:51

## 2022-09-22 NOTE — ED Triage Notes (Signed)
First Nurse Note:  [facility] called for SOB.  C/O leg swelling and told EMS he had been vomiting dark red blood since earlier this morning. No emesis observed.  VS wnl. SPO2 98-99% on RA.  CBG:  137

## 2022-09-22 NOTE — ED Triage Notes (Signed)
Pt to ED from home [facility] for multiple complaints. Bilateral lower leg swelling, constipation (LBM yesterday), SOB, vomiting dark red blood, bruises all over arms from too many blood draws. Complains that he always gets stranded at ER (usually is given taxi voucher, last time CN told him this would be last time).  No hematemesis observed. Pt has unlabored respirations. Skin dry.  SOB etc have started since this afternoon.

## 2022-09-23 ENCOUNTER — Observation Stay: Payer: Medicare PPO

## 2022-09-23 ENCOUNTER — Ambulatory Visit: Payer: Medicare PPO | Admitting: Cardiology

## 2022-09-23 DIAGNOSIS — R7989 Other specified abnormal findings of blood chemistry: Secondary | ICD-10-CM | POA: Insufficient documentation

## 2022-09-23 DIAGNOSIS — R042 Hemoptysis: Secondary | ICD-10-CM

## 2022-09-23 DIAGNOSIS — R0602 Shortness of breath: Secondary | ICD-10-CM | POA: Diagnosis not present

## 2022-09-23 DIAGNOSIS — R062 Wheezing: Secondary | ICD-10-CM | POA: Diagnosis not present

## 2022-09-23 LAB — PHOSPHORUS: Phosphorus: 3.9 mg/dL (ref 2.5–4.6)

## 2022-09-23 LAB — GLUCOSE, CAPILLARY
Glucose-Capillary: 128 mg/dL — ABNORMAL HIGH (ref 70–99)
Glucose-Capillary: 101 mg/dL — ABNORMAL HIGH (ref 70–99)

## 2022-09-23 LAB — CBG MONITORING, ED
Glucose-Capillary: 200 mg/dL — ABNORMAL HIGH (ref 70–99)
Glucose-Capillary: 58 mg/dL — ABNORMAL LOW (ref 70–99)
Glucose-Capillary: 210 mg/dL — ABNORMAL HIGH (ref 70–99)
Glucose-Capillary: 139 mg/dL — ABNORMAL HIGH (ref 70–99)

## 2022-09-23 LAB — BASIC METABOLIC PANEL
Anion gap: 9 (ref 5–15)
BUN: 62 mg/dL — ABNORMAL HIGH (ref 8–23)
CO2: 20 mmol/L — ABNORMAL LOW (ref 22–32)
Calcium: 8.1 mg/dL — ABNORMAL LOW (ref 8.9–10.3)
Chloride: 109 mmol/L (ref 98–111)
Creatinine, Ser: 2.37 mg/dL — ABNORMAL HIGH (ref 0.61–1.24)
GFR, Estimated: 29 mL/min — ABNORMAL LOW (ref >60)
Glucose, Bld: 110 mg/dL — ABNORMAL HIGH (ref 70–99)
Potassium: 4.2 mmol/L (ref 3.5–5.1)
Sodium: 138 mmol/L (ref 135–145)

## 2022-09-23 LAB — BRAIN NATRIURETIC PEPTIDE: B Natriuretic Peptide: 166.8 pg/mL — ABNORMAL HIGH (ref 0.0–100.0)

## 2022-09-23 LAB — CBC
HCT: 28.7 % — ABNORMAL LOW (ref 39.0–52.0)
Hemoglobin: 9 g/dL — ABNORMAL LOW (ref 13.0–17.0)
MCH: 27.5 pg (ref 26.0–34.0)
MCHC: 31.4 g/dL (ref 30.0–36.0)
MCV: 87.8 fL (ref 80.0–100.0)
Platelets: 154 10*3/uL (ref 150–400)
RBC: 3.27 MIL/uL — ABNORMAL LOW (ref 4.22–5.81)
RDW: 15.9 % — ABNORMAL HIGH (ref 11.5–15.5)
WBC: 3.3 10*3/uL — ABNORMAL LOW (ref 4.0–10.5)
nRBC: 0 % (ref 0.0–0.2)

## 2022-09-23 LAB — VITAMIN D 25 HYDROXY (VIT D DEFICIENCY, FRACTURES): Vit D, 25-Hydroxy: 38.54 ng/mL (ref 30–100)

## 2022-09-23 LAB — HEMOGLOBIN AND HEMATOCRIT, BLOOD
HCT: 29.5 % — ABNORMAL LOW (ref 39.0–52.0)
Hemoglobin: 9.2 g/dL — ABNORMAL LOW (ref 13.0–17.0)

## 2022-09-23 LAB — HEMOGLOBIN: Hemoglobin: 8.5 g/dL — ABNORMAL LOW (ref 13.0–17.0)

## 2022-09-23 LAB — MAGNESIUM: Magnesium: 1.9 mg/dL (ref 1.7–2.4)

## 2022-09-23 MED ORDER — ACETAMINOPHEN 325 MG PO TABS: 650.0000 mg | ORAL_TABLET | Freq: Four times a day (QID) | ORAL | Status: DC | PRN

## 2022-09-23 MED ORDER — IPRATROPIUM-ALBUTEROL 0.5-2.5 (3) MG/3ML IN SOLN: 3.0000 mL | Freq: Four times a day (QID) | RESPIRATORY_TRACT | Status: DC | PRN

## 2022-09-23 MED ORDER — TECHNETIUM TO 99M ALBUMIN AGGREGATED
4.3100 | Freq: Once | INTRAVENOUS | Status: AC | PRN
  Administered 2022-09-23: 4.31 via INTRAVENOUS

## 2022-09-23 MED ORDER — APIXABAN 5 MG PO TABS
5.0000 mg | ORAL_TABLET | Freq: Two times a day (BID) | ORAL | Status: DC
  Administered 2022-09-23 – 2022-09-24 (×3): 5 mg via ORAL
  Filled 2022-09-23 (×4): qty 1

## 2022-09-23 MED ORDER — CARVEDILOL 12.5 MG PO TABS
12.5000 mg | ORAL_TABLET | Freq: Two times a day (BID) | ORAL | Status: DC
  Administered 2022-09-23 – 2022-09-24 (×3): 12.5 mg via ORAL
  Filled 2022-09-23: qty 1
  Filled 2022-09-23 (×2): qty 2

## 2022-09-23 MED ORDER — ROSUVASTATIN CALCIUM 10 MG PO TABS
10.0000 mg | ORAL_TABLET | Freq: Every day | ORAL | Status: DC
  Administered 2022-09-23 (×2): 10 mg via ORAL
  Filled 2022-09-23 (×3): qty 1

## 2022-09-23 MED ORDER — MORPHINE SULFATE (PF) 2 MG/ML IV SOLN: 2.0000 mg | INTRAVENOUS | Status: DC | PRN

## 2022-09-23 MED ORDER — HYDRALAZINE HCL 50 MG PO TABS
100.0000 mg | ORAL_TABLET | Freq: Three times a day (TID) | ORAL | Status: DC
  Administered 2022-09-23 – 2022-09-24 (×4): 100 mg via ORAL
  Filled 2022-09-23 (×4): qty 2

## 2022-09-23 MED ORDER — FUROSEMIDE 40 MG PO TABS
40.0000 mg | ORAL_TABLET | Freq: Every day | ORAL | Status: DC
  Administered 2022-09-23 – 2022-09-24 (×2): 40 mg via ORAL
  Filled 2022-09-23 (×2): qty 1

## 2022-09-23 MED ORDER — ONDANSETRON HCL 4 MG/2ML IJ SOLN: 4.0000 mg | Freq: Four times a day (QID) | INTRAMUSCULAR | Status: DC | PRN

## 2022-09-23 MED ORDER — ONDANSETRON HCL 4 MG PO TABS
4.0000 mg | ORAL_TABLET | Freq: Four times a day (QID) | ORAL | Status: DC | PRN
  Administered 2022-09-23: 4 mg via ORAL
  Filled 2022-09-23: qty 1

## 2022-09-23 MED ORDER — IPRATROPIUM-ALBUTEROL 0.5-2.5 (3) MG/3ML IN SOLN
3.0000 mL | Freq: Four times a day (QID) | RESPIRATORY_TRACT | Status: DC
  Administered 2022-09-23 (×3): 3 mL via RESPIRATORY_TRACT
  Filled 2022-09-23 (×3): qty 3

## 2022-09-23 MED ORDER — ALUM & MAG HYDROXIDE-SIMETH 200-200-20 MG/5ML PO SUSP
30.0000 mL | ORAL | Status: DC | PRN
  Administered 2022-09-23: 30 mL via ORAL
  Filled 2022-09-23: qty 30

## 2022-09-23 MED ORDER — PANTOPRAZOLE SODIUM 40 MG IV SOLR
40.0000 mg | INTRAVENOUS | Status: DC
  Administered 2022-09-23 – 2022-09-24 (×2): 40 mg via INTRAVENOUS
  Filled 2022-09-23 (×2): qty 10

## 2022-09-23 MED ORDER — GUAIFENESIN ER 600 MG PO TB12
600.0000 mg | ORAL_TABLET | Freq: Two times a day (BID) | ORAL | Status: DC
  Administered 2022-09-23 – 2022-09-24 (×3): 600 mg via ORAL
  Filled 2022-09-23 (×3): qty 1

## 2022-09-23 MED ORDER — INSULIN ASPART 100 UNIT/ML IJ SOLN
0.0000 [IU] | INTRAMUSCULAR | Status: DC
  Administered 2022-09-23: 3 [IU] via SUBCUTANEOUS
  Administered 2022-09-23: 5 [IU] via SUBCUTANEOUS
  Administered 2022-09-23 – 2022-09-24 (×2): 2 [IU] via SUBCUTANEOUS
  Filled 2022-09-23 (×4): qty 1

## 2022-09-23 MED ORDER — BISACODYL 5 MG PO TBEC: 10.0000 mg | DELAYED_RELEASE_TABLET | Freq: Every day | ORAL | Status: DC | PRN

## 2022-09-23 MED ORDER — POLYSACCHARIDE IRON COMPLEX 150 MG PO CAPS
150.0000 mg | ORAL_CAPSULE | Freq: Every day | ORAL | Status: DC
  Administered 2022-09-23 – 2022-09-24 (×2): 150 mg via ORAL
  Filled 2022-09-23 (×2): qty 1

## 2022-09-23 MED ORDER — ACETAMINOPHEN 650 MG RE SUPP: 650.0000 mg | Freq: Four times a day (QID) | RECTAL | Status: DC | PRN

## 2022-09-23 MED ORDER — BISACODYL 10 MG RE SUPP: 10.0000 mg | Freq: Every day | RECTAL | Status: DC | PRN

## 2022-09-23 MED ORDER — VITAMIN C 500 MG PO TABS
500.0000 mg | ORAL_TABLET | Freq: Every day | ORAL | Status: DC
  Administered 2022-09-23 – 2022-09-24 (×2): 500 mg via ORAL
  Filled 2022-09-23 (×2): qty 1

## 2022-09-23 MED ORDER — POLYETHYLENE GLYCOL 3350 17 G PO PACK: 17.0000 g | PACK | Freq: Every day | ORAL | Status: DC

## 2022-09-23 MED ORDER — SODIUM CHLORIDE 0.9 % IV SOLN: INTRAVENOUS | Status: DC

## 2022-09-23 NOTE — Plan of Care (Signed)
Patient was seen and examined in the ED, patient was admitted due to spitting blood, most likely pneumatosis.  Nuclear medicine scan is negative for any PE.  Patient still feels sick to her stomach and does not feel comfortable to be discharged today, we will continue current treatment and monitor tonight, possible discharge tomorrow a.m. Hb 9.0 stable

## 2022-09-23 NOTE — H&P (Signed)
History and Physical    Patient: Larry Callahan QQI:[id number] DOB: Sep 21, 1955 DOA: 09/22/2022 DOS: the patient was seen and examined on 09/23/2022 PCP: Sallyanne Kuster, NP  Patient coming from: Home  Chief Complaint:  Chief Complaint  Patient presents with   Constipation   Leg Swelling   Shortness of Breath    HPI: Larry Callahan is a 67 y.o. male with medical history significant for CAD  CKD 4, HTN, DM, COPD, OSA on CPAP, systolic heart failure, last EF 40-45% 07/2021, atrial flutter on Eliquis, hospital lysed from 2/18 to 2/21 with hematemesis and EGD showing Barrett's esophagus with no stigmata of bleeding who presents to the ED now with shortness of breath, chest pain and spitting up blood.  He denies fever or chills, abdominal pain.  He states that he feels like he is constipated.  He denies seeing blood in the stool or black stool. ED course and data review: BP 160/90 with otherwise normal vitals. Labs significant for hemoglobin of 9.1 which appears to be his baseline dose 6 days ago it was 11.7.  Creatinine at baseline at 2.79.  Troponin 17, D-dimer 1.77.  EKG, personally viewed and interpreted showing a flutter at 80 with no acute ST-T wave changes.  Chest x-ray nonacute Patient was given Maalox and viscous lidocaine Hospitalist consulted for admission due to concerns for possible PE given elevated D-dimer     Past Medical History:  Diagnosis Date   Allergy    Atrial flutter    a. Dx 12/2020--CHA2DS2VASc = 5-->eliquis.   Chronic combined systolic (congestive) and diastolic (congestive) heart failure    a. 04/2019 Echo: EF 45-50%; b. 02/2019 Echo: EF 55-60%; c. 09/2020 Echo: EF 35-40%, glob HK. Nl RV size/fxn. Mild MR; d. 07/2021 Echo: EF 40-45%, mild LVH, low-nl RV fxn, no MR.   CKD (chronic kidney disease), stage III    COPD (chronic obstructive pulmonary disease)    Coronary artery disease    a. 2011 Cath: nonobs dzs; b. 09/2020 MV: EF 38%. No signif ischemia.  ? inf MI vs diaph attenuation. GI uptake artifact.   Diabetes mellitus without complication    GERD (gastroesophageal reflux disease)    History of CVA (cerebrovascular accident) 09/25/2020   Hyperlipidemia    Hypertension    Morbid (severe) obesity due to excess calories 06/09/2017   Morbid obesity    NICM (nonischemic cardiomyopathy)    a. 04/2019 Echo: EF 45-50%; b. 02/2019 Echo: EF 55-60%; c. 09/2020 Echo: EF 35-40%, glob HK; d. 09/2020 MV: No ischemia. ? inf infarct vs attenuation; d. 07/2021 Echo: EF 40-45%.   OSA (obstructive sleep apnea)    Pneumonia due to COVID-19 virus 01/30/2020   Stage 3b chronic kidney disease    Syncope and collapse 11/16/2020   Past Surgical History:  Procedure Laterality Date   CARDIAC CATHETERIZATION     CHOLECYSTECTOMY     COLONOSCOPY WITH PROPOFOL N/A 04/12/2019   Procedure: COLONOSCOPY WITH PROPOFOL;  Surgeon: Toney Reil, MD;  Location: [facility] ENDOSCOPY;  Service: Gastroenterology;  Laterality: N/A;   ESOPHAGOGASTRODUODENOSCOPY N/A 04/12/2019   Procedure: ESOPHAGOGASTRODUODENOSCOPY (EGD);  Surgeon: Toney Reil, MD;  Location: [facility] ENDOSCOPY;  Service: Gastroenterology;  Laterality: N/A;   ESOPHAGOGASTRODUODENOSCOPY (EGD) WITH PROPOFOL N/A 11/21/2019   Procedure: ESOPHAGOGASTRODUODENOSCOPY (EGD) WITH PROPOFOL;  Surgeon: Toney Reil, MD;  Location: [facility] ENDOSCOPY;  Service: Gastroenterology;  Laterality: N/A;   ESOPHAGOGASTRODUODENOSCOPY (EGD) WITH PROPOFOL N/A 01/11/2022   Procedure: ESOPHAGOGASTRODUODENOSCOPY (EGD) WITH PROPOFOL;  Surgeon: Midge Minium, MD;  Location: [name] ENDOSCOPY;  Service: Endoscopy;  Laterality: N/A;   ESOPHAGOGASTRODUODENOSCOPY (EGD) WITH PROPOFOL N/A 08/02/2022   Procedure: ESOPHAGOGASTRODUODENOSCOPY (EGD) WITH PROPOFOL;  Surgeon: Midge Minium, MD;  Location: [facility] ENDOSCOPY;  Service: Endoscopy;  Laterality: N/A;   FLEXIBLE BRONCHOSCOPY Bilateral 05/17/2019   Procedure: FLEXIBLE BRONCHOSCOPY;  Surgeon: Yevonne Pax, MD;  Location: [name] ORS;  Service: Pulmonary;  Laterality: Bilateral;   left arm surgery     nephrectomy Left    PARATHYROIDECTOMY     RIGHT HEART CATH N/A 03/29/2019   Procedure: RIGHT HEART CATH;  Surgeon: Antonieta Iba, MD;  Location: [name] INVASIVE CV LAB;  Service: Cardiovascular;  Laterality: N/A;   Social History:  reports that he quit smoking about 2 years ago. His smoking use included cigarettes. He has quit using smokeless tobacco.  His smokeless tobacco use included chew. He reports that he does not drink alcohol and does not use drugs.  Allergies  Allergen Reactions   Penicillins Rash   Codeine Nausea And Vomiting    Family History  Problem Relation Age of Onset   Diabetes Mother        2003   Lung cancer Father    Diabetes Sister    Hypertension Sister    Heart disease Sister    Cancer Sister    Bone cancer Brother    Prostate cancer Neg Hx    Bladder Cancer Neg Hx    Kidney cancer Neg Hx     Prior to Admission medications   Medication Sig Start Date End Date Taking? Authorizing Provider  Accu-Chek Softclix Lancets lancets Use to check blood sugars 4 times a day   E11.65 08/20/21   Sallyanne Kuster, NP  acetaminophen (TYLENOL) 325 MG tablet Take 2 tablets (650 mg total) by mouth every 6 (six) hours as needed for mild pain or moderate pain (or Fever >/= 101). 08/03/22   Gillis Santa, MD  acetaminophen-codeine (TYLENOL #3) 300-30 MG tablet Take one tab po at night for left shoulder pain 09/13/22   Lyndon Code, MD  apixaban (ELIQUIS) 5 MG TABS tablet Take 1 tablet (5 mg total) by mouth 2 (two) times daily. 08/18/22   Antonieta Iba, MD  ascorbic acid (VITAMIN C) 500 MG tablet Take 1 tablet (500 mg total) by mouth daily. 08/04/22 11/02/22  Gillis Santa, MD  Blood Glucose Monitoring Suppl (TRUE METRIX AIR GLUCOSE METER) DEVI 1 Device by Does not apply route 2 (two) times daily. 04/13/18   Carlean Jews, NP  Budeson-Glycopyrrol-Formoterol (BREZTRI  AEROSPHERE) 160-9-4.8 MCG/ACT AERO Inhale 2 puffs into the lungs 2 (two) times daily. Patient taking differently: Inhale 2 puffs into the lungs 2 (two) times daily as needed. 03/29/22   Sallyanne Kuster, NP  carvedilol (COREG) 12.5 MG tablet Take 1 tablet (12.5 mg total) by mouth 2 (two) times daily. 02/25/22   Delma Freeze, FNP  cyanocobalamin 1000 MCG tablet Take 1 tablet (1,000 mcg total) by mouth daily. 07/15/22   Sallyanne Kuster, NP  doxycycline (VIBRA-TABS) 100 MG tablet Take 1 tablet (100 mg total) by mouth 2 (two) times daily. 08/31/22   Menshew, Charlesetta Ivory, PA-C  folic acid (FOLVITE) 1 MG tablet Take 1 tablet (1 mg total) by mouth daily. 08/04/22 11/02/22  Gillis Santa, MD  furosemide (LASIX) 40 MG tablet Take 1 tablet (40 mg total) by mouth daily. 09/05/22   Delma Freeze, FNP  glimepiride (AMARYL) 2 MG tablet Take 1 tablet (2 mg total) by mouth daily.  03/29/22   Sallyanne Kuster, NP  glucose blood (ACCU-CHEK GUIDE) test strip Use to check blood sugars 4 times a day   E11.65 12/13/21   Lyndon Code, MD  hydrALAZINE (APRESOLINE) 100 MG tablet Take 1 tablet (100 mg total) by mouth 3 (three) times daily. 09/19/22   Sallyanne Kuster, NP  insulin glargine, 2 Unit Dial, (TOUJEO MAX SOLOSTAR) 300 UNIT/ML Solostar Pen Inject 20 units in am 03/03/22   McDonough, Salomon Fick, PA-C  Insulin Pen Needle (NOVOFINE PEN NEEDLE) 32G X 6 MM MISC Use as directed with toujeo pen 08/15/22   Abernathy, Arlyss Repress, NP  ipratropium-albuterol (DUONEB) 0.5-2.5 (3) MG/3ML SOLN Take 3 mLs by nebulization every 6 (six) hours as needed. 06/29/21   Danford, Earl Lites, MD  iron polysaccharides (NIFEREX) 150 MG capsule Take 1 capsule (150 mg total) by mouth daily. 08/03/22 11/01/22  Gillis Santa, MD  methocarbamol (ROBAXIN) 500 MG tablet Take 1 tablet (500 mg total) by mouth every 6 (six) hours as needed for muscle spasms. 06/16/22   Sallyanne Kuster, NP  mupirocin ointment (BACTROBAN) 2 % Apply 1 Application topically daily.  To wound on elbow until healed. 07/26/22   Sallyanne Kuster, NP  omeprazole (PRILOSEC) 40 MG capsule Take 1 capsule (40 mg total) by mouth daily. Take 1 capsule once in the morning and once in the evening 08/03/22   Gillis Santa, MD  ondansetron (ZOFRAN-ODT) 4 MG disintegrating tablet Take 1 tablet (4 mg total) by mouth every 6 (six) hours as needed for nausea or vomiting. 09/14/22   Ward, Layla Maw, DO  polyethylene glycol (MIRALAX) 17 g packet Take 17 g by mouth daily. 09/17/22   Poggi, Eileen Stanford E, PA-C  potassium chloride SA (KLOR-CON M) 20 MEQ tablet Take 20 mEq by mouth daily. 06/23/22   [provider]  rosuvastatin (CRESTOR) 10 MG tablet Take 1 tablet (10 mg total) by mouth at bedtime. 09/19/22   Sallyanne Kuster, NP  Vitamin D, Ergocalciferol, (DRISDOL) 1.25 MG (50000 UNIT) CAPS capsule Take 1 capsule (50,000 Units total) by mouth every 7 (seven) days. 08/03/22 11/01/22  Gillis Santa, MD    Physical Exam: Vitals:   09/22/22 1540 09/22/22 1900 09/22/22 2000 09/22/22 2100  BP:  (!) 150/95 (!) 151/88 (!) 171/84  Pulse:  90 75 73  Resp: (!) 22     Temp:      TempSrc:      SpO2:  100% 100% 99%  Weight:      Height:       Physical Exam Vitals and nursing note reviewed.  Constitutional:      General: He is not in acute distress. HENT:     Head: Normocephalic and atraumatic.  Cardiovascular:     Rate and Rhythm: Normal rate and regular rhythm.     Heart sounds: Normal heart sounds.  Pulmonary:     Effort: Pulmonary effort is normal.     Breath sounds: Normal breath sounds.  Abdominal:     Palpations: Abdomen is soft.     Tenderness: There is no abdominal tenderness.  Neurological:     Mental Status: Mental status is at baseline.     Labs on Admission: I have personally reviewed following labs and imaging studies  CBC: Recent Labs  Lab 09/17/22 1432 09/22/22 1544  WBC 3.6* 4.3  NEUTROABS 2.1  --   HGB 11.7* 9.1*  HCT 39.1 29.4*  MCV 91.1 86.7  PLT 130* 159   Basic  Metabolic Panel: Recent Labs  Lab  09/17/22 1432 09/22/22 1544  NA 137 134*  K 4.3 3.8  CL 112* 109  CO2 15* 17*  GLUCOSE 129* 109*  BUN 66* 73*  CREATININE 2.68* 2.79*  CALCIUM 8.2* 7.9*   GFR: Estimated Creatinine Clearance: 28.8 mL/min (A) (by C-G formula based on SCr of 2.79 mg/dL (H)). Liver Function Tests: Recent Labs  Lab 09/17/22 1432  AST 37  ALT 18  ALKPHOS 63  BILITOT 1.0  PROT 6.7  ALBUMIN 2.6*   Recent Labs  Lab 09/17/22 1432  LIPASE 49   No results for input(s): "AMMONIA" in the last 168 hours. Coagulation Profile: No results for input(s): "INR", "PROTIME" in the last 168 hours. Cardiac Enzymes: No results for input(s): "CKTOTAL", "CKMB", "CKMBINDEX", "TROPONINI" in the last 168 hours. BNP (last 3 results) No results for input(s): "PROBNP" in the last 8760 hours. HbA1C: No results for input(s): "HGBA1C" in the last 72 hours. CBG: No results for input(s): "GLUCAP" in the last 168 hours. Lipid Profile: No results for input(s): "CHOL", "HDL", "LDLCALC", "TRIG", "CHOLHDL", "LDLDIRECT" in the last 72 hours. Thyroid Function Tests: No results for input(s): "TSH", "T4TOTAL", "FREET4", "T3FREE", "THYROIDAB" in the last 72 hours. Anemia Panel: No results for input(s): "VITAMINB12", "FOLATE", "FERRITIN", "TIBC", "IRON", "RETICCTPCT" in the last 72 hours. Urine analysis:    Component Value Date/Time   COLORURINE STRAW (A) 09/17/2022 1432   APPEARANCEUR CLEAR (A) 09/17/2022 1432   APPEARANCEUR Clear 03/29/2022 1504   LABSPEC 1.009 09/17/2022 1432   LABSPEC 1.005 03/06/2013 0929   PHURINE 5.0 09/17/2022 1432   GLUCOSEU NEGATIVE 09/17/2022 1432   GLUCOSEU Negative 03/06/2013 0929   HGBUR NEGATIVE 09/17/2022 1432   BILIRUBINUR NEGATIVE 09/17/2022 1432   BILIRUBINUR Negative 03/29/2022 1504   BILIRUBINUR Negative 03/06/2013 0929   KETONESUR NEGATIVE 09/17/2022 1432   PROTEINUR 100 (A) 09/17/2022 1432   UROBILINOGEN 0.2 02/20/2019 1404   NITRITE NEGATIVE  09/17/2022 1432   LEUKOCYTESUR NEGATIVE 09/17/2022 1432   LEUKOCYTESUR Negative 03/06/2013 0929    Radiological Exams on Admission: DG Chest 2 View  Result Date: 09/22/2022 CLINICAL DATA:  Shortness of breath. Bilateral lower extremity edema. EXAM: CHEST - 2 VIEW COMPARISON:  Chest radiograph 08/31/2022 and CT 08/01/2022 FINDINGS: The cardiac silhouette is mildly enlarged. The thoracic aorta is mildly tortuous. No airspace consolidation, edema, pleural effusion, or pneumothorax is identified. No acute osseous abnormality is seen. IMPRESSION: No active cardiopulmonary disease. Electronically Signed   By: Sebastian Ache M.D.   On: 09/22/2022 16:02     Data Reviewed: Relevant notes from primary care and specialist visits, past discharge summaries as available in EHR, including Care Everywhere. Prior diagnostic testing as pertinent to current admission diagnoses Updated medications and problem lists for reconciliation ED course, including vitals, labs, imaging, treatment and response to treatment Triage notes, nursing and pharmacy notes and ED provider's notes Notable results as noted in HPI   Assessment and Plan:  Hemoptysis with elevated D-dimer Low suspicion for PE as patient compliant with Eliquis Elevated D-dimer could be secondary to stage IV CKD V/Q in the a.m. Given stable hemoglobin we will continue Eliquis, especially in view of unrevealing EGD in February Will get serial H&H  CAD Troponin 17 in and EKG nonacute  Continue aspirin, statins, beta-blocker and nitrates Low suspicion for ACS   Atrial flutter (HCC) - Continue carvedilol - Continue Eliquis   Chronic systolic CHF (congestive heart failure) (HCC) - Not acutely exacerbated -Left ventricular ejection fraction, by estimation, is 40 to 45%, 07/2021 - Continue  carvedilol, furosemide, Imdur.  Currently on Entresto.   COPD (chronic obstructive pulmonary disease) (HCC) - Not acutely exacerbated - Albuterol as needed,  home inhalers   Essential hypertension - Continue amlodipine, hydralazine and furosemide   CKD (chronic kidney disease), stage IV (HCC) - Renal function at baseline   Diabetes mellitus type II, controlled (HCC) Sliding scale insulin coverage    DVT prophylaxis: Eliquis  Consults: none  Advance Care Planning:   Code Status: Prior   Family Communication: none  Disposition Plan: Back to previous home environment  Severity of Illness: The appropriate patient status for this patient is OBSERVATION. Observation status is judged to be reasonable and necessary in order to provide the required intensity of service to ensure the patient's safety. The patient's presenting symptoms, physical exam findings, and initial radiographic and laboratory data in the context of their medical condition is felt to place them at decreased risk for further clinical deterioration. Furthermore, it is anticipated that the patient will be medically stable for discharge from the hospital within 2 midnights of admission.   Author: Andris Baumann, MD 09/23/2022 1:31 AM  For on call review [email or web address].

## 2022-09-24 DIAGNOSIS — R042 Hemoptysis: Secondary | ICD-10-CM | POA: Diagnosis not present

## 2022-09-24 LAB — CBC
HCT: 28.2 % — ABNORMAL LOW (ref 39.0–52.0)
Hemoglobin: 8.9 g/dL — ABNORMAL LOW (ref 13.0–17.0)
MCH: 27.5 pg (ref 26.0–34.0)
MCHC: 31.6 g/dL (ref 30.0–36.0)
MCV: 87 fL (ref 80.0–100.0)
Platelets: 135 10*3/uL — ABNORMAL LOW (ref 150–400)
RBC: 3.24 MIL/uL — ABNORMAL LOW (ref 4.22–5.81)
RDW: 15.9 % — ABNORMAL HIGH (ref 11.5–15.5)
WBC: 3.4 10*3/uL — ABNORMAL LOW (ref 4.0–10.5)
nRBC: 0 % (ref 0.0–0.2)

## 2022-09-24 LAB — BASIC METABOLIC PANEL
Anion gap: 8 (ref 5–15)
BUN: 63 mg/dL — ABNORMAL HIGH (ref 8–23)
CO2: 19 mmol/L — ABNORMAL LOW (ref 22–32)
Calcium: 8.1 mg/dL — ABNORMAL LOW (ref 8.9–10.3)
Chloride: 110 mmol/L (ref 98–111)
Creatinine, Ser: 2.51 mg/dL — ABNORMAL HIGH (ref 0.61–1.24)
GFR, Estimated: 27 mL/min — ABNORMAL LOW (ref >60)
Glucose, Bld: 103 mg/dL — ABNORMAL HIGH (ref 70–99)
Potassium: 4.1 mmol/L (ref 3.5–5.1)
Sodium: 137 mmol/L (ref 135–145)

## 2022-09-24 LAB — PHOSPHORUS: Phosphorus: 3.5 mg/dL (ref 2.5–4.6)

## 2022-09-24 LAB — GLUCOSE, CAPILLARY
Glucose-Capillary: 104 mg/dL — ABNORMAL HIGH (ref 70–99)
Glucose-Capillary: 136 mg/dL — ABNORMAL HIGH (ref 70–99)

## 2022-09-24 LAB — MAGNESIUM: Magnesium: 1.9 mg/dL (ref 1.7–2.4)

## 2022-09-24 MED ORDER — IPRATROPIUM-ALBUTEROL 0.5-2.5 (3) MG/3ML IN SOLN
3.0000 mL | Freq: Three times a day (TID) | RESPIRATORY_TRACT | Status: DC
  Administered 2022-09-24: 3 mL via RESPIRATORY_TRACT
  Filled 2022-09-24 (×2): qty 3

## 2022-09-24 MED ORDER — POLYSACCHARIDE IRON COMPLEX 150 MG PO CAPS: 150.0000 mg | ORAL_CAPSULE | Freq: Every day | ORAL | 0 refills | Status: DC

## 2022-09-24 MED ORDER — BISACODYL 5 MG PO TBEC: 10.0000 mg | DELAYED_RELEASE_TABLET | Freq: Every day | ORAL | 0 refills | Status: DC | PRN

## 2022-09-24 MED ORDER — VITAMIN D (ERGOCALCIFEROL) 1.25 MG (50000 UNIT) PO CAPS: 50000.0000 [IU] | ORAL_CAPSULE | ORAL | 0 refills | Status: AC

## 2022-09-24 MED ORDER — ALUM & MAG HYDROXIDE-SIMETH 200-200-20 MG/5ML PO SUSP: 30.0000 mL | ORAL | 0 refills | Status: DC | PRN

## 2022-09-24 MED ORDER — SODIUM BICARBONATE 325 MG PO TABS: 325.0000 mg | ORAL_TABLET | Freq: Two times a day (BID) | ORAL | 0 refills | Status: AC

## 2022-09-24 MED ORDER — GUAIFENESIN ER 600 MG PO TB12: 600.0000 mg | ORAL_TABLET | Freq: Two times a day (BID) | ORAL | 0 refills | Status: AC

## 2022-09-24 MED ORDER — IPRATROPIUM-ALBUTEROL 0.5-2.5 (3) MG/3ML IN SOLN: 3.0000 mL | Freq: Four times a day (QID) | RESPIRATORY_TRACT | Status: DC | PRN

## 2022-09-24 NOTE — Discharge Summary (Signed)
[facility] Discharge Summary   Patient: Larry Callahan ZOX:[id number]  PCP: Sallyanne Kuster, NP  Date of admission: 09/22/2022   Date of discharge:  09/24/2022     Discharge Diagnoses:  Principal Problem:   Hemoptysis Active Problems:   Chronic systolic CHF (congestive heart failure)   Obstructive sleep apnea, adult   Type 2 diabetes mellitus   CKD (chronic kidney disease) stage 4, GFR 15-29 ml/min   Positive D dimer   Admitted From: Home Disposition:  Home   Recommendations for Outpatient Follow-up:  PCP: in 1 wk Follow up LABS/TEST: CBC and BMP in 1 week   Diet recommendation: Cardiac and Carb modified diet  Activity: The patient is advised to gradually reintroduce usual activities, as tolerated  Discharge Condition: stable  Code Status: Full code   History of present illness: As per the H and P dictated on admission Hospital Course:  Larry Callahan is a 67 y.o. male with medical history significant for CAD  CKD 4, HTN, DM, COPD, OSA on CPAP, systolic heart failure, last EF 40-45% 07/2021, atrial flutter on Eliquis, hospital lysed from 2/18 to 2/21 with hematemesis and EGD showing Barrett's esophagus with no stigmata of bleeding who presents to the ED now with shortness of breath, chest pain and spitting up blood.  He denies fever or chills, abdominal pain.  He states that he feels like he is constipated.  He denies seeing blood in the stool or black stool. ED w/up: BP 160/90 with otherwise normal vitals. Hb 9.1 at baseline, but Hb 11.7 on 09/17/22? Creatinine at baseline at 2.79.  Troponin 17, D-dimer 1.77.   EKG: flutter at 80 with no acute ST-T wave changes.  Chest x-ray nonacute Patient was given Maalox and viscous lidocaine. Hospitalist consulted for admission due to concerns for possible PE given elevated D-dimer   Assessment and Plan: # Hemoptysis with elevated D-dimer Low suspicion for PE as patient compliant with Eliquis. Elevated D-dimer could  be secondary to stage IV CKD.  NM perfusion scan negative for PE.  H&H remained stable.  Hemoptysis resolved.  EGD in February did not reveal any significant findings.  Patient was advised to follow with PCP and repeat CBC after 1 week. # CAD, Troponin 17 in and EKG nonacute, Continue aspirin, statins, beta-blocker and nitrates. Low suspicion for ACS.  Follow with PCP and cardiology as an outpatient. # Atrial flutter, Continue carvedilol and Eliquis # Chronic systolic CHF (congestive heart failure) will compensated, no exacerbation noticed on exam. LVEF 40 to 45%, 07/2021. Continue carvedilol, furosemide. # COPD (chronic obstructive pulmonary disease) stable, continued home inhalers.   # Essential hypertension, continued Coreg, hydralazine and Lasix home meds # CKD (chronic kidney disease), stage IV, Renal function at baseline # Diabetes mellitus type II, controlled, s/p Sliding scale insulin coverage, resumed home meds on discharge.  Continue diabetic diet and follow with PCP.  Body mass index is 27.89 kg/m.  Nutrition Interventions:   Pressure Injury 09/24/22 Buttocks Right Stage 2 -  Partial thickness loss of dermis presenting as a shallow open injury with a red, pink wound bed without slough. (Active)  09/24/22 0803  Location: Buttocks  Location Orientation: Right  Staging: Stage 2 -  Partial thickness loss of dermis presenting as a shallow open injury with a red, pink wound bed without slough.  Wound Description (Comments):   Present on Admission: Yes  Dressing Type Foam - Lift dressing to assess site every shift 09/24/22 0749     Patient  was ambulatory without any assistance. On the day of the discharge the patient's vitals were stable, and no other acute medical condition were reported by patient. the patient was felt safe to be discharge at Home.  Consultants: None Procedures: None  Discharge Exam: General: Appear in no distress, no Rash; Oral Mucosa Clear,  moist. Cardiovascular: S1 and S2 Present, no Murmur, Respiratory: normal respiratory effort, Bilateral Air entry present and no Crackles, no wheezes Abdomen: Bowel Sound present, Soft and no tenderness, no hernia Extremities: no Pedal edema, no calf tenderness Neurology: alert and oriented to time, place, and person affect appropriate.  Filed Weights   09/22/22 1537  Weight: 90.7 kg   Vitals:   09/24/22 0401 09/24/22 0850  BP: (!) 145/91 (!) 142/66  Pulse: 74 70  Resp: 18 16  Temp: 97.9 F (36.6 C) 98.3 F (36.8 C)  SpO2: 100% 97%    DISCHARGE MEDICATION: Allergies as of 09/24/2022       Reactions   Penicillins Rash   Codeine Nausea And Vomiting        Medication List     STOP taking these medications    doxycycline 100 MG tablet Commonly known as: VIBRA-TABS   levofloxacin 500 MG tablet Commonly known as: LEVAQUIN   methocarbamol 500 MG tablet Commonly known as: ROBAXIN   mupirocin ointment 2 % Commonly known as: BACTROBAN       TAKE these medications    Accu-Chek Guide test strip Generic drug: glucose blood Use to check blood sugars 4 times a day   E11.65   Accu-Chek Softclix Lancets lancets Use to check blood sugars 4 times a day   E11.65   acetaminophen 325 MG tablet Commonly known as: TYLENOL Take 2 tablets (650 mg total) by mouth every 6 (six) hours as needed for mild pain or moderate pain (or Fever >/= 101).   acetaminophen-codeine 300-30 MG tablet Commonly known as: TYLENOL #3 Take one tab po at night for left shoulder pain   alum & mag hydroxide-simeth 200-200-20 MG/5ML suspension Commonly known as: MAALOX/MYLANTA Take 30 mLs by mouth every 4 (four) hours as needed for indigestion or heartburn.   apixaban 5 MG Tabs tablet Commonly known as: Eliquis Take 1 tablet (5 mg total) by mouth 2 (two) times daily.   ascorbic acid 500 MG tablet Commonly known as: VITAMIN C Take 1 tablet (500 mg total) by mouth daily.   bisacodyl 5 MG EC  tablet Commonly known as: DULCOLAX Take 2 tablets (10 mg total) by mouth daily as needed for moderate constipation.   Breztri Aerosphere 160-9-4.8 MCG/ACT Aero Generic drug: Budeson-Glycopyrrol-Formoterol Inhale 2 puffs into the lungs 2 (two) times daily. What changed:  when to take this reasons to take this   carvedilol 12.5 MG tablet Commonly known as: Coreg Take 1 tablet (12.5 mg total) by mouth 2 (two) times daily.   cyanocobalamin 1000 MCG tablet Take 1 tablet (1,000 mcg total) by mouth daily.   folic acid 1 MG tablet Commonly known as: FOLVITE Take 1 tablet (1 mg total) by mouth daily.   furosemide 40 MG tablet Commonly known as: LASIX Take 1 tablet (40 mg total) by mouth daily.   glimepiride 2 MG tablet Commonly known as: AMARYL Take 1 tablet (2 mg total) by mouth daily.   guaiFENesin 600 MG 12 hr tablet Commonly known as: MUCINEX Take 1 tablet (600 mg total) by mouth 2 (two) times daily for 7 days.   hydrALAZINE 100 MG tablet Commonly known  as: APRESOLINE Take 1 tablet (100 mg total) by mouth 3 (three) times daily.   ipratropium-albuterol 0.5-2.5 (3) MG/3ML Soln Commonly known as: DUONEB Take 3 mLs by nebulization every 6 (six) hours as needed.   iron polysaccharides 150 MG capsule Commonly known as: NIFEREX Take 1 capsule (150 mg total) by mouth daily.   Novofine Pen Needle 32G X 6 MM Misc Generic drug: Insulin Pen Needle Use as directed with toujeo pen   omeprazole 40 MG capsule Commonly known as: PRILOSEC Take 1 capsule (40 mg total) by mouth daily. Take 1 capsule once in the morning and once in the evening   ondansetron 4 MG disintegrating tablet Commonly known as: ZOFRAN-ODT Take 1 tablet (4 mg total) by mouth every 6 (six) hours as needed for nausea or vomiting.   polyethylene glycol 17 g packet Commonly known as: MiraLax Take 17 g by mouth daily.   potassium chloride SA 20 MEQ tablet Commonly known as: KLOR-CON M Take 20 mEq by mouth  daily.   rosuvastatin 10 MG tablet Commonly known as: CRESTOR Take 1 tablet (10 mg total) by mouth at bedtime.   sodium bicarbonate 325 MG tablet Take 1 tablet (325 mg total) by mouth 2 (two) times daily for 14 days.   Toujeo Max SoloStar 300 UNIT/ML Solostar Pen Generic drug: insulin glargine (2 Unit Dial) Inject 20 units in am   True Metrix Air Glucose Meter Devi 1 Device by Does not apply route 2 (two) times daily.   Vitamin D (Ergocalciferol) 1.25 MG (50000 UNIT) Caps capsule Commonly known as: DRISDOL Take 1 capsule (50,000 Units total) by mouth every 7 (seven) days.               Discharge Care Instructions  (From admission, onward)           Start     Ordered   09/24/22 0000  Discharge wound care:       Comments: As above   09/24/22 1056           Allergies  Allergen Reactions   Penicillins Rash   Codeine Nausea And Vomiting   Discharge Instructions     Call MD for:   Complete by: As directed    Hemoptysis or GI bleeding   Call MD for:  difficulty breathing, headache or visual disturbances   Complete by: As directed    Call MD for:  extreme fatigue   Complete by: As directed    Call MD for:  persistant dizziness or light-headedness   Complete by: As directed    Call MD for:  persistant nausea and vomiting   Complete by: As directed    Call MD for:  severe uncontrolled pain   Complete by: As directed    Call MD for:  temperature >100.4   Complete by: As directed    Diet - low sodium heart healthy   Complete by: As directed    Discharge instructions   Complete by: As directed    Follow-up with PCP in 1 week, repeat CBC and BMP after 1 week Patient is concerned about polypharmacy, recommended to follow with PCP to go over the list and to decrease medications if possible. Patient will benefit from follow-up with nephrology for CKD stage IV as an outpatient.   Discharge wound care:   Complete by: As directed    As above   Increase  activity slowly   Complete by: As directed        The  results of significant diagnostics from this hospitalization (including imaging, microbiology, ancillary and laboratory) are listed below for reference.    Significant Diagnostic Studies: NM Pulmonary Perfusion  Result Date: 09/23/2022 CLINICAL DATA:  Pulmonary embolism suspected, low to intermediate probability, negative D-dimer. Shortness of breath, wheezing. EXAM: NUCLEAR MEDICINE PERFUSION LUNG SCAN TECHNIQUE: Perfusion images were obtained in multiple projections after intravenous injection of radiopharmaceutical. Ventilation scans intentionally deferred if perfusion scan and chest x-ray adequate for interpretation during COVID 19 epidemic. RADIOPHARMACEUTICALS:  4.31 mCi Tc-31m MAA IV COMPARISON:  None Available. FINDINGS: No segmental or lobar perfusion defect to suggest pulmonary embolism. IMPRESSION: No evidence of pulmonary embolism. Electronically Signed   By: Bary Richard M.D.   On: 09/23/2022 10:23   DG Chest 2 View  Result Date: 09/22/2022 CLINICAL DATA:  Shortness of breath. Bilateral lower extremity edema. EXAM: CHEST - 2 VIEW COMPARISON:  Chest radiograph 08/31/2022 and CT 08/01/2022 FINDINGS: The cardiac silhouette is mildly enlarged. The thoracic aorta is mildly tortuous. No airspace consolidation, edema, pleural effusion, or pneumothorax is identified. No acute osseous abnormality is seen. IMPRESSION: No active cardiopulmonary disease. Electronically Signed   By: Sebastian Ache M.D.   On: 09/22/2022 16:02   CT ABDOMEN PELVIS WO CONTRAST  Result Date: 09/17/2022 CLINICAL DATA:  Abdominal pain, constipation EXAM: CT ABDOMEN AND PELVIS WITHOUT CONTRAST TECHNIQUE: Multidetector CT imaging of the abdomen and pelvis was performed following the standard protocol without IV contrast. RADIATION DOSE REDUCTION: This exam was performed according to the departmental dose-optimization program which includes automated exposure control,  adjustment of the mA and/or kV according to patient size and/or use of iterative reconstruction technique. COMPARISON:  07/31/2022 FINDINGS: Lower chest: Left hemidiaphragm is elevated. There are no focal infiltrates in the lower lung fields. There is no pleural effusion. Hepatobiliary: Surgical clips are seen in gallbladder fossa. No focal abnormalities are seen in liver. Pancreas: No focal abnormalities are seen. Spleen: There are calcified granulomas in spleen. Adrenals/Urinary Tract: There is 9.3 x 5.6 cm smooth marginated lesion inseparable from the left adrenal with fluid density measurement. This lesion measured 8.6 cm in maximum diameter in the previous study. Surgical clips are seen in left renal bed suggesting previous left nephrectomy. There is no hydronephrosis in the right kidney. There are multiple small marginated fluid density lesions in the liver measuring up to 8.3 cm, possibly cysts. There is 7 x 6.1 cm exophytic lesion in the lower pole of right kidney with inhomogeneous attenuation. There are few high density smoothly marginated nodules which may suggest hemorrhagic or proteinaceous cysts. No right renal or ureteral stone is seen. Urinary bladder is unremarkable. Stomach/Bowel: Stomach is unremarkable. Small bowel loops are not dilated. Appendix is not dilated. There is no significant wall thickening in colon. Multiple diverticula seen in the colon without signs of focal diverticulitis. Vascular/Lymphatic: Extensive arterial calcifications are seen in aorta and its major branches. Reproductive: Unremarkable. Other: There is no ascites or pneumoperitoneum. There is umbilical/paraumbilical hernia containing fat. Hernial sac measures 7.9 cm in maximum diameter. There is a left flank hernia containing mostly fat and short segment of a small bowel loop without signs of incarceration or obstruction. Musculoskeletal: Degenerative changes are noted in lumbar spine and right hip. The 2.2 cm sclerotic  density in the body of T12 vertebra with no significant interval change. IMPRESSION: There is no evidence of intestinal obstruction or pneumoperitoneum. Previous left nephrectomy. There is no hydronephrosis in the right kidney. The appendix is not dilated. There is a 9.3  cm fluid density lesion in the left adrenal with no significant change. There is 7 cm mixed density lesion in the lower pole of right kidney with no significant change. Evaluation of this lesion is limited in this noncontrast study. There are simple and hyperdense cysts in the right kidney. Diverticulosis of colon without signs of focal diverticulitis. Umbilical hernia containing fat. There is left flank hernia containing fat and short segment of a small bowel loop without incarceration or obstruction. 2.2 cm sclerotic density seen in the body of T12 vertebra has not changed. Other findings as described in the body of the report. Electronically Signed   By: Ernie Avena M.D.   On: 09/17/2022 15:23   MR BRAIN WO CONTRAST  Result Date: 09/12/2022 CLINICAL DATA:  Left facial symptoms. EXAM: MRI HEAD WITHOUT CONTRAST TECHNIQUE: Multiplanar, multiecho pulse sequences of the brain and surrounding structures were obtained without intravenous contrast. COMPARISON:  Same-day CT head FINDINGS: A truncated protocol was performed. Axial and coronal DWI, axial FLAIR, and axial SWI sequences were obtained. Brain: There is no diffusion restriction to suggest acute infarct. There is no evidence of acute intracranial hemorrhage or extra-axial fluid collection on the provided sequences. Parenchymal volume is normal. The ventricles are normal in size. There is mild background chronic small-vessel ischemic change. There is a punctate chronic microhemorrhage in the right parietal cortex, nonspecific. There is no mass lesion.  There is no mass effect or midline shift. Vascular: Not well assessed on the provided sequences. Skull and upper cervical spine: Not  well assessed on the provided sequences. Sinuses/Orbits: No acute finding. Other: None. IMPRESSION: No acute intracranial pathology. Electronically Signed   By: Lesia Hausen M.D.   On: 09/12/2022 18:10   CT HEAD CODE STROKE WO CONTRAST  Result Date: 09/12/2022 CLINICAL DATA:  Code stroke.  Left facial symptoms. EXAM: CT HEAD WITHOUT CONTRAST TECHNIQUE: Contiguous axial images were obtained from the base of the skull through the vertex without intravenous contrast. RADIATION DOSE REDUCTION: This exam was performed according to the departmental dose-optimization program which includes automated exposure control, adjustment of the mA and/or kV according to patient size and/or use of iterative reconstruction technique. COMPARISON:  CT head 07/31/2022 FINDINGS: Brain: There is no acute intracranial hemorrhage, extra-axial fluid collection, or acute infarct Parenchymal volume is within expected limits for age. The ventricles are normal in size. Gray-white differentiation is preserved The pituitary and suprasellar region are normal. There is no mass lesion there is no mass effect or midline shift. Vascular: No hyperdense vessel is seen. Skull: Normal. Negative for fracture or focal lesion. Sinuses/Orbits: The imaged paranasal sinuses are clear. The mastoid air cells and middle ear cavities are clear. The globes and orbits are unremarkable. Other: None. ASPECTS [facility] Stroke Program Early CT Score) - Ganglionic level infarction (caudate, lentiform nuclei, internal capsule, insula, M1-M3 cortex): 7 - Supraganglionic infarction (M4-M6 cortex): 3 Total score (0-10 with 10 being normal): 10 IMPRESSION: No acute intracranial pathology. Electronically Signed   By: Lesia Hausen M.D.   On: 09/12/2022 17:59   DG Abdomen 1 View  Result Date: 08/31/2022 CLINICAL DATA:  Constipation. EXAM: ABDOMEN - 1 VIEW COMPARISON:  12/06/2012 FINDINGS: The bowel gas pattern is normal. No radio-opaque calculi or other significant  radiographic abnormality are seen. Post op changes upper abdomen and RLQ. IMPRESSION: Negative. Electronically Signed   By: Layla Maw M.D.   On: 08/31/2022 15:17   DG Chest 2 View  Result Date: 08/31/2022 CLINICAL DATA:  dif breathing  EXAM: CHEST - 2 VIEW COMPARISON:  07/31/2022. FINDINGS: The heart size and mediastinal contours are within normal limits. Both lungs are clear. No pneumothorax or pleural effusion. There are thoracic degenerative changes. IMPRESSION: No active cardiopulmonary disease. Electronically Signed   By: Layla Maw M.D.   On: 08/31/2022 15:14    Microbiology: Recent Results (from the past 240 hour(s))  Aerobic Culture w Gram Stain (superficial specimen)     Status: None   Collection Time: 09/15/22  9:51 AM   Specimen: Other Source  Result Value Ref Range Status   Specimen Description   Final    OTHER Performed at [facility], [address] [place], [place] [address]    Special Requests   Final    NONE Performed at [facility], [address], [place], [place] [address]    Gram Stain   Final    FEW WBC PRESENT, PREDOMINANTLY PMN FEW GRAM NEGATIVE RODS Performed at [facility], [address], [place], [place] [address]    Culture   Final    ABUNDANT PSEUDOMONAS AERUGINOSA ABUNDANT MORGANELLA MORGANII MORGANELLA MORGANII UNABLE TO ISOLATE FOR SENSITIVITIES    Report Status 09/20/2022 FINAL  Final   Organism ID, Bacteria PSEUDOMONAS AERUGINOSA  Final      Susceptibility   Pseudomonas aeruginosa - MIC*    CEFTAZIDIME 2 SENSITIVE Sensitive     CIPROFLOXACIN <=0.25 SENSITIVE Sensitive     GENTAMICIN <=1 SENSITIVE Sensitive     IMIPENEM 2 SENSITIVE Sensitive     PIP/TAZO <=4 SENSITIVE Sensitive     CEFEPIME 2 SENSITIVE Sensitive     * ABUNDANT PSEUDOMONAS AERUGINOSA     Labs: CBC: Recent Labs  Lab 09/17/22 1432 09/22/22 1544 09/23/22 0357 09/23/22 0933 09/23/22 1903 09/24/22 0341  WBC 3.6* 4.3  --  3.3*   --  3.4*  NEUTROABS 2.1  --   --   --   --   --   HGB 11.7* 9.1* 8.5* 9.0* 9.2* 8.9*  HCT 39.1 29.4*  --  28.7* 29.5* 28.2*  MCV 91.1 86.7  --  87.8  --  87.0  PLT 130* 159  --  154  --  135*   Basic Metabolic Panel: Recent Labs  Lab 09/17/22 1432 09/22/22 1544 09/23/22 1145 09/24/22 0341  NA 137 134* 138 137  K 4.3 3.8 4.2 4.1  CL 112* 109 109 110  CO2 15* 17* 20* 19*  GLUCOSE 129* 109* 110* 103*  BUN 66* 73* 62* 63*  CREATININE 2.68* 2.79* 2.37* 2.51*  CALCIUM 8.2* 7.9* 8.1* 8.1*  MG  --   --  1.9 1.9  PHOS  --   --  3.9 3.5   Liver Function Tests: Recent Labs  Lab 09/17/22 1432  AST 37  ALT 18  ALKPHOS 63  BILITOT 1.0  PROT 6.7  ALBUMIN 2.6*   Recent Labs  Lab 09/17/22 1432  LIPASE 49   No results for input(s): "AMMONIA" in the last 168 hours. Cardiac Enzymes: No results for input(s): "CKTOTAL", "CKMB", "CKMBINDEX", "TROPONINI" in the last 168 hours. BNP (last 3 results) Recent Labs    07/31/22 1117 08/31/22 1511 09/23/22 0933  BNP 230.0* 174.4* 166.8*   CBG: Recent Labs  Lab 09/23/22 1734 09/23/22 2017 09/23/22 2318 09/24/22 0402 09/24/22 0902  GLUCAP 139* 128* 101* 104* 136*    Time spent: 35 minutes  Signed:  Gillis Santa  [facility]  09/24/2022 11:06 AM

## 2022-09-26 DIAGNOSIS — G4733 Obstructive sleep apnea (adult) (pediatric): Secondary | ICD-10-CM | POA: Diagnosis not present

## 2022-09-26 DIAGNOSIS — J449 Chronic obstructive pulmonary disease, unspecified: Secondary | ICD-10-CM | POA: Diagnosis not present

## 2022-09-26 DIAGNOSIS — I2510 Atherosclerotic heart disease of native coronary artery without angina pectoris: Secondary | ICD-10-CM | POA: Diagnosis not present

## 2022-09-26 DIAGNOSIS — I110 Hypertensive heart disease with heart failure: Secondary | ICD-10-CM | POA: Diagnosis not present

## 2022-09-26 DIAGNOSIS — I509 Heart failure, unspecified: Secondary | ICD-10-CM | POA: Diagnosis not present

## 2022-09-26 DIAGNOSIS — Z602 Problems related to living alone: Secondary | ICD-10-CM | POA: Diagnosis not present

## 2022-09-26 DIAGNOSIS — L0231 Cutaneous abscess of buttock: Secondary | ICD-10-CM | POA: Diagnosis not present

## 2022-09-26 DIAGNOSIS — E119 Type 2 diabetes mellitus without complications: Secondary | ICD-10-CM | POA: Diagnosis not present

## 2022-09-26 DIAGNOSIS — E785 Hyperlipidemia, unspecified: Secondary | ICD-10-CM | POA: Diagnosis not present

## 2022-09-27 ENCOUNTER — Ambulatory Visit: Payer: Medicare PPO | Admitting: Urology

## 2022-09-28 ENCOUNTER — Emergency Department: Payer: Medicare PPO

## 2022-09-28 ENCOUNTER — Emergency Department
Admission: EM | Admit: 2022-09-28 | Discharge: 2022-09-28 | Disposition: A | Discharge status: Home / Self Care | Payer: Medicare PPO | Attending: Emergency Medicine | Admitting: Emergency Medicine

## 2022-09-28 ENCOUNTER — Encounter: Payer: Self-pay | Admitting: Urology

## 2022-09-28 ENCOUNTER — Telehealth: Payer: Self-pay

## 2022-09-28 ENCOUNTER — Telehealth: Payer: Self-pay | Admitting: Nurse Practitioner

## 2022-09-28 ENCOUNTER — Telehealth: Payer: Self-pay | Admitting: Internal Medicine

## 2022-09-28 ENCOUNTER — Other Ambulatory Visit: Payer: Self-pay

## 2022-09-28 DIAGNOSIS — I4891 Unspecified atrial fibrillation: Secondary | ICD-10-CM | POA: Insufficient documentation

## 2022-09-28 DIAGNOSIS — E119 Type 2 diabetes mellitus without complications: Secondary | ICD-10-CM | POA: Diagnosis not present

## 2022-09-28 DIAGNOSIS — E1122 Type 2 diabetes mellitus with diabetic chronic kidney disease: Secondary | ICD-10-CM | POA: Insufficient documentation

## 2022-09-28 DIAGNOSIS — I5023 Acute on chronic systolic (congestive) heart failure: Secondary | ICD-10-CM | POA: Diagnosis not present

## 2022-09-28 DIAGNOSIS — R600 Localized edema: Secondary | ICD-10-CM

## 2022-09-28 DIAGNOSIS — I509 Heart failure, unspecified: Secondary | ICD-10-CM | POA: Diagnosis not present

## 2022-09-28 DIAGNOSIS — Z602 Problems related to living alone: Secondary | ICD-10-CM | POA: Diagnosis not present

## 2022-09-28 DIAGNOSIS — E785 Hyperlipidemia, unspecified: Secondary | ICD-10-CM | POA: Diagnosis not present

## 2022-09-28 DIAGNOSIS — J449 Chronic obstructive pulmonary disease, unspecified: Secondary | ICD-10-CM | POA: Insufficient documentation

## 2022-09-28 DIAGNOSIS — R0602 Shortness of breath: Secondary | ICD-10-CM | POA: Diagnosis not present

## 2022-09-28 DIAGNOSIS — R0600 Dyspnea, unspecified: Secondary | ICD-10-CM | POA: Insufficient documentation

## 2022-09-28 DIAGNOSIS — I10 Essential (primary) hypertension: Secondary | ICD-10-CM | POA: Diagnosis not present

## 2022-09-28 DIAGNOSIS — R2241 Localized swelling, mass and lump, right lower limb: Secondary | ICD-10-CM | POA: Insufficient documentation

## 2022-09-28 DIAGNOSIS — Z7901 Long term (current) use of anticoagulants: Secondary | ICD-10-CM | POA: Diagnosis not present

## 2022-09-28 DIAGNOSIS — N184 Chronic kidney disease, stage 4 (severe): Secondary | ICD-10-CM | POA: Diagnosis not present

## 2022-09-28 DIAGNOSIS — L0231 Cutaneous abscess of buttock: Secondary | ICD-10-CM | POA: Diagnosis not present

## 2022-09-28 DIAGNOSIS — R609 Edema, unspecified: Secondary | ICD-10-CM | POA: Insufficient documentation

## 2022-09-28 DIAGNOSIS — I130 Hypertensive heart and chronic kidney disease with heart failure and stage 1 through stage 4 chronic kidney disease, or unspecified chronic kidney disease: Secondary | ICD-10-CM | POA: Insufficient documentation

## 2022-09-28 DIAGNOSIS — I2510 Atherosclerotic heart disease of native coronary artery without angina pectoris: Secondary | ICD-10-CM | POA: Insufficient documentation

## 2022-09-28 DIAGNOSIS — I110 Hypertensive heart disease with heart failure: Secondary | ICD-10-CM | POA: Diagnosis not present

## 2022-09-28 DIAGNOSIS — G4733 Obstructive sleep apnea (adult) (pediatric): Secondary | ICD-10-CM | POA: Diagnosis not present

## 2022-09-28 LAB — CBC WITH DIFFERENTIAL/PLATELET
Abs Immature Granulocytes: 0.03 10*3/uL (ref 0.00–0.07)
Basophils Absolute: 0 10*3/uL (ref 0.0–0.1)
Basophils Relative: 1 %
Eosinophils Absolute: 0.1 10*3/uL (ref 0.0–0.5)
Eosinophils Relative: 2 %
HCT: 30.6 % — ABNORMAL LOW (ref 39.0–52.0)
Hemoglobin: 9.6 g/dL — ABNORMAL LOW (ref 13.0–17.0)
Immature Granulocytes: 1 %
Lymphocytes Relative: 20 %
Lymphs Abs: 0.8 10*3/uL (ref 0.7–4.0)
MCH: 27.2 pg (ref 26.0–34.0)
MCHC: 31.4 g/dL (ref 30.0–36.0)
MCV: 86.7 fL (ref 80.0–100.0)
Monocytes Absolute: 0.2 10*3/uL (ref 0.1–1.0)
Monocytes Relative: 5 %
Neutro Abs: 2.9 10*3/uL (ref 1.7–7.7)
Neutrophils Relative %: 71 %
Platelets: 173 10*3/uL (ref 150–400)
RBC: 3.53 MIL/uL — ABNORMAL LOW (ref 4.22–5.81)
RDW: 15.3 % (ref 11.5–15.5)
WBC: 4 10*3/uL (ref 4.0–10.5)
nRBC: 0 % (ref 0.0–0.2)

## 2022-09-28 LAB — COMPREHENSIVE METABOLIC PANEL
ALT: 18 U/L (ref 0–44)
AST: 25 U/L (ref 15–41)
Albumin: 2.5 g/dL — ABNORMAL LOW (ref 3.5–5.0)
Alkaline Phosphatase: 64 U/L (ref 38–126)
Anion gap: 7 (ref 5–15)
BUN: 62 mg/dL — ABNORMAL HIGH (ref 8–23)
CO2: 21 mmol/L — ABNORMAL LOW (ref 22–32)
Calcium: 8.2 mg/dL — ABNORMAL LOW (ref 8.9–10.3)
Chloride: 106 mmol/L (ref 98–111)
Creatinine, Ser: 2.77 mg/dL — ABNORMAL HIGH (ref 0.61–1.24)
GFR, Estimated: 24 mL/min — ABNORMAL LOW (ref >60)
Glucose, Bld: 108 mg/dL — ABNORMAL HIGH (ref 70–99)
Potassium: 4.3 mmol/L (ref 3.5–5.1)
Sodium: 134 mmol/L — ABNORMAL LOW (ref 135–145)
Total Bilirubin: 0.7 mg/dL (ref 0.3–1.2)
Total Protein: 6.5 g/dL (ref 6.5–8.1)

## 2022-09-28 LAB — BRAIN NATRIURETIC PEPTIDE: B Natriuretic Peptide: 161.6 pg/mL — ABNORMAL HIGH (ref 0.0–100.0)

## 2022-09-28 LAB — CBG MONITORING, ED: Glucose-Capillary: 101 mg/dL — ABNORMAL HIGH (ref 70–99)

## 2022-09-28 MED ORDER — FUROSEMIDE 40 MG PO TABS
40.0000 mg | ORAL_TABLET | Freq: Once | ORAL | Status: AC
  Administered 2022-09-28: 40 mg via ORAL
  Filled 2022-09-28: qty 1

## 2022-09-28 NOTE — ED Triage Notes (Signed)
C/O right leg pain and swelling.  States symptoms started while inpatient at [facility] (discharged on Saturday) and worse today.  AAOx3 skin warm and dry. NAD

## 2022-09-28 NOTE — Discharge Instructions (Addendum)
Your blood work ultrasound chest x-ray and EKG were all reassuring.  Please make sure you are elevating your legs and taking your Lasix.  You can also wear compression stocking to help with the swelling.  Please follow-up with your primary care doctor and cardiologist.

## 2022-09-28 NOTE — Telephone Encounter (Signed)
Pt called that his leg is swollen and difficulty walking and I offered him appt he is unable to come in for his appt as per alyssa advised that he can to ED or he can come for appt  pt prefers to go to ED

## 2022-09-28 NOTE — Telephone Encounter (Signed)
Lvm to schedule virtual hospital follow up-[name]

## 2022-09-28 NOTE — Telephone Encounter (Signed)
Nephrology referral sent via [facility] to [facility] -[name] 

## 2022-09-28 NOTE — ED Provider Triage Note (Signed)
Emergency Medicine Provider Triage Evaluation Note  Keston Birden Uhlig , a 67 y.o. male  was evaluated in triage.  Pt complains of RLE swelling and pain. No chest pain or shortness of breath. Swelling started prior to dc from hospital on 09/24/22 but worse today.  Physical Exam  BP (!) 161/91   Pulse (!) 101   Temp 97.8 F (36.6 C)   Resp 16   Ht 5\' 11"  (1.803 m)   Wt 95.7 kg   SpO2 98%   BMI 29.43 kg/m  Gen:   Awake, no distress   Resp:  Normal effort  MSK:   Moves extremities without difficulty. RLE with pitting edema from foot to upper calf.  Other:    Medical Decision Making  Medically screening exam initiated at 3:52 PM.  Appropriate orders placed.  Sotero Torcivia Meadors was informed that the remainder of the evaluation will be completed by another provider, this initial triage assessment does not replace that evaluation, and the importance of remaining in the ED until their evaluation is complete.  Labs and [name] ordered.   Chinita Pester, FNP 09/28/22 1555

## 2022-09-28 NOTE — ED Provider Notes (Signed)
[facility] Provider Note    Event Date/Time   First MD Initiated Contact with Patient 09/28/22 1652     (approximate)   History   Leg Swelling   HPI  Larry Callahan is a 67 y.o. male with past medical history of COPD, diabetes, hypertension, coronary artery disease, atrial fibrillation on Eliquis who presents because of right lower extremity swelling.  Patient noticed this today when he was at [employer].  Ankle was also diffusely itchy.  He does endorse some dyspnea and feels like his heart is beating irregularly but denies chest pain.  He is compliant with his medications.  Denies any injury to the leg.  Patient was recently admitted to hospital for hematemesis was discharged several days ago.     Past Medical History:  Diagnosis Date   Allergy    Atrial flutter    a. Dx 12/2020--CHA2DS2VASc = 5-->eliquis.   Chronic combined systolic (congestive) and diastolic (congestive) heart failure    a. 04/2019 Echo: EF 45-50%; b. 02/2019 Echo: EF 55-60%; c. 09/2020 Echo: EF 35-40%, glob HK. Nl RV size/fxn. Mild MR; d. 07/2021 Echo: EF 40-45%, mild LVH, low-nl RV fxn, no MR.   CKD (chronic kidney disease), stage III    COPD (chronic obstructive pulmonary disease)    Coronary artery disease    a. 2011 Cath: nonobs dzs; b. 09/2020 MV: EF 38%. No signif ischemia. ? inf MI vs diaph attenuation. GI uptake artifact.   Diabetes mellitus without complication    GERD (gastroesophageal reflux disease)    History of CVA (cerebrovascular accident) 09/25/2020   Hyperlipidemia    Hypertension    Morbid (severe) obesity due to excess calories 06/09/2017   Morbid obesity    NICM (nonischemic cardiomyopathy)    a. 04/2019 Echo: EF 45-50%; b. 02/2019 Echo: EF 55-60%; c. 09/2020 Echo: EF 35-40%, glob HK; d. 09/2020 MV: No ischemia. ? inf infarct vs attenuation; d. 07/2021 Echo: EF 40-45%.   OSA (obstructive sleep apnea)    Pneumonia due to COVID-19 virus 01/30/2020   Stage 3b  chronic kidney disease    Syncope and collapse 11/16/2020    Patient Active Problem List   Diagnosis Date Noted   Positive D dimer 09/23/2022   Hemoptysis 09/23/2022   Left arm numbness 09/13/2022   Acute upper GI bleed 07/31/2022   Chest pain 07/31/2022   Hematoma of left flank 07/31/2022   Acute on chronic anemia 07/31/2022   Renal mass 07/31/2022   Upper GI bleed    Dizziness and giddiness 01/04/2022   Umbilical hernia 01/04/2022   Acute on chronic systolic CHF (congestive heart failure) 08/05/2021   Acquired thrombophilia 06/28/2021   Anemia of chronic kidney failure 06/28/2021   PAD (peripheral artery disease)    Cellulitis of right arm 03/02/2021   Atrial flutter 01/07/2021   Demand ischemia    Influenza A 10/30/2020   HLD (hyperlipidemia) 10/06/2020   Gastroesophageal reflux disease without esophagitis 04/19/2020   Elevated troponin I level 01/29/2020   Hematemesis 01/09/2020   Hospital discharge follow-up 11/26/2019   Obesity (BMI 30-39.9) 08/08/2019   Acute right hip pain 06/17/2019   Inability to ambulate due to hip 06/17/2019   CKD (chronic kidney disease) stage 4, GFR 15-29 ml/min    Hypervolemia    Full code status 05/20/2019   Pleuritic chest pain    Acute kidney injury superimposed on CKD 3b (HCC) 05/09/2019   Acute on chronic heart failure with preserved ejection fraction (HFpEF)  Chronic systolic CHF (congestive heart failure) 03/01/2019   Iron deficiency anemia 12/31/2018   Atopic dermatitis 11/24/2018   Screening for colon cancer 11/06/2017   Type 2 diabetes mellitus 11/06/2017   Hidradenitis 06/09/2017   Atherosclerotic heart disease of native coronary artery without angina pectoris 06/09/2017   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   Obstructive sleep apnea, adult 06/09/2017   Cigarette nicotine dependence with nicotine-induced disorder 06/09/2017   Diabetic polyneuropathy associated with type 2 diabetes mellitus  06/09/2017   Allergic rhinitis due to pollen 06/09/2017   Mixed hyperlipidemia 06/09/2017   COPD (chronic obstructive pulmonary disease) 06/09/2017   Wheezing 06/09/2017   Dysuria 06/09/2017   Essential hypertension 06/09/2017   Personal history of other malignant neoplasm of kidney 06/09/2017   Pain in right hip 06/09/2017   Secondary hyperparathyroidism, not elsewhere classified 06/09/2017   Tinea corporis 06/09/2017     Physical Exam  Triage Vital Signs: ED Triage Vitals  Enc Vitals Group     BP 09/28/22 1543 (!) 161/91     Pulse Rate 09/28/22 1543 (!) 101     Resp 09/28/22 1543 16     Temp 09/28/22 1543 97.8 F (36.6 C)     Temp src --      SpO2 09/28/22 1543 98 %     Weight 09/28/22 1550 211 lb (95.7 kg)     Height 09/28/22 1550  (1.803 m)     Head Circumference --      Peak Flow --      Pain Score 09/28/22 1550 6     Pain Loc --      Pain Edu? --      Excl. in GC? --     Most recent vital signs: Vitals:   09/28/22 1543  BP: (!) 161/91  Pulse: (!) 101  Resp: 16  Temp: 97.8 F (36.6 C)  SpO2: 98%     General: Awake, no distress.  CV:  Good peripheral perfusion.  Resp:  Normal effort.  Abd:  No distention.  Neuro:             Awake, Alert, Oriented x 3  Other:  Pitting edema bilateral lower extremities right greater than left, there is excoriation over the right posterior ankle with no other obvious lesions 2+ DP pulse intact plantarflexion dorsiflexion and sensation over the foot   ED Results / Procedures / Treatments  Labs (all labs ordered are listed, but only abnormal results are displayed) Labs Reviewed  BRAIN NATRIURETIC PEPTIDE - Abnormal; Notable for the following components:      Result Value   B Natriuretic Peptide 161.6 (*)    All other components within normal limits  COMPREHENSIVE METABOLIC PANEL - Abnormal; Notable for the following components:   Sodium 134 (*)    CO2 21 (*)    Glucose, Bld 108 (*)    BUN 62 (*)     Creatinine, Ser 2.77 (*)    Calcium 8.2 (*)    Albumin 2.5 (*)    GFR, Estimated 24 (*)    All other components within normal limits  CBC WITH DIFFERENTIAL/PLATELET - Abnormal; Notable for the following components:   RBC 3.53 (*)    Hemoglobin 9.6 (*)    HCT 30.6 (*)    All other components within normal limits  CBG MONITORING, ED - Abnormal; Notable for the following components:   Glucose-Capillary 101 (*)    All other components within normal limits  EKG  EKG reviewed and interpreted by myself shows atrial flutter with variable conduction and PVC no acute ischemic changes   RADIOLOGY I reviewed and interpreted the CXR which does not show any acute cardiopulmonary process    PROCEDURES:  Critical Care performed: No  Procedures  The patient is on the cardiac monitor to evaluate for evidence of arrhythmia and/or significant heart rate changes.   MEDICATIONS ORDERED IN ED: Medications  furosemide (LASIX) tablet 40 mg (has no administration in time range)     IMPRESSION / MDM / ASSESSMENT AND PLAN / ED COURSE  I reviewed the triage vital signs and the nursing notes.                              Patient's presentation is most consistent with acute complicated illness / injury requiring diagnostic workup.  Differential diagnosis includes, but is not limited to, DVT, peripheral edema, contact dermatitis, lymphedema, venous stasis  Patient is a 67 year old male with multiple medical comorbidities presents with right lower extremity swelling.  Started today while he was in [employer] at recess when he noticed it first.  He denies any preceding injury.  Does say he is having difficulty walking as a result of the swelling.  On exam he does have some asymmetric edema but there is pitting edema in both lower extremities.  He has good pulses and is neurovascular intact.  He did note that the ankle was very itchy and I do see some excoriations but no obvious rash.  DVT study was  obtained is negative.  Patient was complaining of some dyspnea lung sounds are clear did obtain chest x-ray to evaluate for pulmonary edema and this is negative.  He is saturating well on room air.  Patient also complaining of feeling like his heart is beating irregularly.  EKG obtained shows a flutter with 2-1 conduction which is normal for him he has known A-fib a flutter and is anticoagulated.  Labs are overall reassuring renal function is near baseline, mildly elevated at 160.  Ultimately my suspicion for acute life-threatening process is low.  I did give him an extra dose of p.o. furosemide for the peripheral edema.  Do think patient can safely be discharged.       FINAL CLINICAL IMPRESSION(S) / ED DIAGNOSES   Final diagnoses:  Peripheral edema     Rx / DC Orders   ED Discharge Orders     None        Note:  This document was prepared using Dragon voice recognition software and may include unintentional dictation errors.   Georga Hacking, MD 09/28/22 [phone]

## 2022-09-30 ENCOUNTER — Telehealth: Payer: Self-pay | Admitting: *Deleted

## 2022-09-30 ENCOUNTER — Encounter: Payer: Medicare PPO | Admitting: Physician Assistant

## 2022-09-30 DIAGNOSIS — L0231 Cutaneous abscess of buttock: Secondary | ICD-10-CM | POA: Diagnosis not present

## 2022-09-30 NOTE — Telephone Encounter (Signed)
        Patient  visited [facility] on 09/28/2022  for TREATMENT    Telephone encounter attempt :  1st  A HIPAA compliant voice message was left requesting a return call.  Instructed patient to call back at [phone].  Yehuda Mao [facility] -[facility] [place], Population Health [phone] [address] [place] , [place] [address] Email : [email or web address]

## 2022-09-30 NOTE — Progress Notes (Addendum)
ZAKRY, CASO ([id number]) [id number] Page 1 of 7 Visit Report for 09/30/2022 Arrival Information Details Patient Name: Date of Service: Larry Callahan MES F. 09/30/2022 9:00 A M Medical Record Number: [id number] Patient Account Number: [id number] Date of Birth/Sex: Treating RN: 1955/11/03 (67 y.o. Larry Callahan) Yevonne Pax Primary Care Arrion Broaddus: Sallyanne Kuster Other Clinician: Referring Kasumi Ditullio: Treating Miken Stecher/Extender: Weldon Inches Weeks in Treatment: 6 Visit Information History Since Last Visit Added or deleted any medications: No Patient Arrived: Ambulatory Any new allergies or adverse reactions: No Arrival Time: 08:53 Had a fall or experienced change in No Accompanied By: self activities of daily living that may affect Transfer Assistance: None risk of falls: Patient Identification Verified: Yes Signs or symptoms of abuse/neglect since last visito No Secondary Verification Process Completed: Yes Hospitalized since last visit: No Patient Requires Transmission-Based Precautions: No Implantable device outside of the clinic excluding No Patient Has Alerts: Yes cellular tissue based products placed in the center Patient Alerts: Patient on Blood Thinner since last visit: Has Dressing in Place as Prescribed: Yes Pain Present Now: No Electronic Signature(s) Signed: 09/30/2022 11:42:53 AM By: Yevonne Pax RN Entered By: Yevonne Pax on 09/30/2022 08:54:00 -------------------------------------------------------------------------------- Clinic Level of Care Assessment Details Patient Name: Date of Service: Larry Callahan MES F. 09/30/2022 9:00 A M Medical Record Number: [id number] Patient Account Number: [id number] Date of Birth/Sex: Treating RN: [birth year] (67 y.o. Larry Callahan) Yevonne Pax Primary Care Magdala Brahmbhatt: Sallyanne Kuster Other Clinician: Referring Hassell Patras: Treating Bunnie Rehberg/Extender: Weldon Inches Weeks in Treatment: 6 Clinic  Level of Care Assessment Items TOOL 4 Quantity Score X- 1 0 Use when only an EandM is performed on FOLLOW-UP visit ASSESSMENTS - Nursing Assessment / Reassessment X- 1 10 Reassessment of Co-morbidities (includes updates in patient status) X- 1 5 Reassessment of Adherence to Treatment Plan ASSESSMENTS - Wound and Skin A ssessment / Reassessment X - Simple Wound Assessment / Reassessment - one wound 1 5 []  - 0 Complex Wound Assessment / Reassessment - multiple wounds []  - 0 Dermatologic / Skin Assessment (not related to wound area) ASSESSMENTS - Focused Assessment []  - 0 Circumferential Edema Measurements - multi extremities []  - 0 Nutritional Assessment / Counseling / Intervention []  - 0 Lower Extremity Assessment (monofilament, tuning fork, pulses) []  - 0 Peripheral Arterial Disease Assessment (using hand held doppler) ASSESSMENTS - Ostomy and/or Continence Assessment and Care []  - 0 Incontinence Assessment and Management []  - 0 Ostomy Care Assessment and Management (repouching, etc.) PROCESS - Coordination of Care X - Simple Patient / Family Education for ongoing care 1 [address] [place] F ([id number]) [id number] Page 2 of 7 []  - 0 Complex (extensive) Patient / Family Education for ongoing care []  - 0 Staff obtains Chiropractor, Records, T Results / Process Orders est []  - 0 Staff telephones HHA, Nursing Homes / Clarify orders / etc []  - 0 Routine Transfer to another Facility (non-emergent condition) []  - 0 Routine Hospital Admission (non-emergent condition) []  - 0 New Admissions / Manufacturing engineer / Ordering NPWT Apligraf, etc. , []  - 0 Emergency Hospital Admission (emergent condition) X- 1 10 Simple Discharge Coordination []  - 0 Complex (extensive) Discharge Coordination PROCESS - Special Needs []  - 0 Pediatric / Minor Patient Management []  - 0 Isolation Patient Management []  - 0 Hearing / Language / Visual special needs []  -  0 Assessment of Community assistance (transportation, D/C planning, etc.) []  - 0 Additional assistance / Altered mentation []  - 0 Support Surface(s) Assessment (bed, cushion, seat, etc.) INTERVENTIONS -  Wound Cleansing / Measurement X - Simple Wound Cleansing - one wound 1 5  - 0 Complex Wound Cleansing - multiple wounds X- 1 5 Wound Imaging (photographs - any number of wounds)  - 0 Wound Tracing (instead of photographs) X- 1 5 Simple Wound Measurement - one wound  - 0 Complex Wound Measurement - multiple wounds INTERVENTIONS - Wound Dressings X - Small Wound Dressing one or multiple wounds 1 10  - 0 Medium Wound Dressing one or multiple wounds  - 0 Large Wound Dressing one or multiple wounds  - 0 Application of Medications - topical  - 0 Application of Medications - injection INTERVENTIONS - Miscellaneous  - 0 External ear exam  - 0 Specimen Collection (cultures, biopsies, blood, body fluids, etc.)  - 0 Specimen(s) / Culture(s) sent or taken to Lab for analysis  - 0 Patient Transfer (multiple staff / Nurse, adult / Similar devices)  - 0 Simple Staple / Suture removal (25 or less)  - 0 Complex Staple / Suture removal (26 or more)  - 0 Hypo / Hyperglycemic Management (close monitor of Blood Glucose)  - 0 Ankle / Brachial Index (ABI) - do not check if billed separately X- 1 5 Vital Signs Has the patient been seen at the hospital within the last three years: Yes Total Score: 75 Level Of Care: New/Established - Level 2 Electronic Signature(s) Signed: 09/30/2022 11:42:53 AM By: Yevonne Pax RN Entered By: Yevonne Pax on 09/30/2022 09:09:44 [place] ([id number]) [id number] Page 3 of 7 -------------------------------------------------------------------------------- Encounter Discharge Information Details Patient Name: Date of Service: Larry Callahan MES F. 09/30/2022 9:00 A M Medical Record Number:  [id number] Patient Account Number: [id number] Date of Birth/Sex: Treating RN: 12-31-1955 (67 y.o. Larry Callahan) Yevonne Pax Primary Care Sou Nohr: Sallyanne Kuster Other Clinician: Referring Trentin Knappenberger: Treating Tamryn Popko/Extender: Weldon Inches Weeks in Treatment: 6 Encounter Discharge Information Items Discharge Condition: Stable Ambulatory Status: Ambulatory Discharge Destination: Home Transportation: Private Auto Accompanied By: self Schedule Follow-up Appointment: Yes Clinical Summary of Care: Electronic Signature(s) Signed: 09/30/2022 9:10:56 AM By: Yevonne Pax RN Entered By: Yevonne Pax on 09/30/2022 09:10:56 -------------------------------------------------------------------------------- Lower Extremity Assessment Details Patient Name: Date of Service: Larry Callahan MES F. 09/30/2022 9:00 A M Medical Record Number: [id number] Patient Account Number: [id number] Date of Birth/Sex: Treating RN: 07-Jun-1956 (67 y.o. Larry Callahan) Yevonne Pax Primary Care Maybel Dambrosio: Sallyanne Kuster Other Clinician: Referring Tyliek Timberman: Treating Khalaya Mcgurn/Extender: Weldon Inches Weeks in Treatment: 6 Electronic Signature(s) Signed: 09/30/2022 11:42:53 AM By: Yevonne Pax RN Entered By: Yevonne Pax on 09/30/2022 08:57:56 -------------------------------------------------------------------------------- Multi Wound Chart Details Patient Name: Date of Service: Larry Callahan MES F. 09/30/2022 9:00 A M Medical Record Number: [id number] Patient Account Number: [id number] Date of Birth/Sex: Treating RN: 1956/04/04 (67 y.o. Larry Callahan) Yevonne Pax Primary Care Zykeem Bauserman: Sallyanne Kuster Other Clinician: Referring Harryette Shuart: Treating Shawntrice Salle/Extender: Weldon Inches Weeks in Treatment: 6 Vital Signs Height(in): 71 Pulse(bpm): 77 Weight(lbs): 229 Blood Pressure(mmHg): 134/75 Body Mass Index(BMI): 31.9 Temperature(F): 97.7 Respiratory Rate(breaths/min): 18 [2:Photos:]  [N/A:N/A] SRIYAN, CUTTING ([id number]) [2:Right Gluteus Wound Location: Gradually Appeared Wounding Event: Abscess Primary Etiology: Chronic Obstructive Pulmonary Comorbid History: Disease (COPD), Arrhythmia, Coronary Artery Disease, Hypertension, Type II Diabetes 08/29/2022 Date Acquired: 4  Weeks of Treatment: Open Wound Status: No Wound Recurrence: 2x2.5x0.4 Measurements L x W x D (cm) 3.927 A (cm) : rea 1.571 Volume (cm) : 42.90% % Reduction in Area: 23.80% % Reduction in Volume: Full Thickness Without Exposed Classification: Support  Structures Medium Exudate Amount: Serosanguineous  Exudate Type: red, brown Exudate Color: Large (67-100%) Granulation Amount: Red Granulation Quality: None Present (0%) Necrotic Amount: Fat Layer (Subcutaneous Tissue): Yes N/A Exposed Structures: Fascia:  No Tendon: No Muscle: No Joint: No Bone: No None Epithelialization:] [N/A:N/A N/A N/A N/A N/A N/A N/A N/A N/A N/A N/A N/A N/A N/A N/A N/A N/A N/A N/A N/A N/A] Treatment Notes Electronic Signature(s) Signed: 09/30/2022 11:42:53 AM By: Yevonne Pax RN Entered By: Yevonne Pax on 09/30/2022 08:58:05 -------------------------------------------------------------------------------- Multi-Disciplinary Care Plan Details Patient Name: Date of Service: Larry Callahan MES F. 09/30/2022 9:00 A M Medical Record Number: [id number] Patient Account Number: [id number] Date of Birth/Sex: Treating RN: 07/17/1955 (67 y.o. Larry Callahan) Yevonne Pax Primary Care Khair Chasteen: Sallyanne Kuster Other Clinician: Referring Remee Charley: Treating Tiersa Dayley/Extender: Weldon Inches Weeks in Treatment: 6 Active Inactive Necrotic Tissue Nursing Diagnoses: Knowledge deficit related to management of necrotic/devitalized tissue Goals: Necrotic/devitalized tissue will be minimized in the wound bed Date Initiated: 09/01/2022 Target Resolution Date: 10/02/2022 Goal Status: Active Interventions: Assess patient pain level pre-, during and post  procedure and prior to discharge Provide education on necrotic tissue and debridement process Notes: Electronic Signature(s) Signed: 09/30/2022 9:10:14 AM By: Yevonne Pax RN Entered By: Yevonne Pax on 09/30/2022 09:10:14 [place] ([id number]) [id number] Page 5 of 7 -------------------------------------------------------------------------------- Pain Assessment Details Patient Name: Date of Service: Larry Callahan MES F. 09/30/2022 9:00 A M Medical Record Number: [id number] Patient Account Number: [id number] Date of Birth/Sex: Treating RN: [birth year] (67 y.o. Larry Callahan) Yevonne Pax Primary Care Blayke Cordrey: Sallyanne Kuster Other Clinician: Referring Nakeya Adinolfi: Treating Masiel Gentzler/Extender: Weldon Inches Weeks in Treatment: 6 Active Problems Location of Pain Severity and Description of Pain Patient Has Paino No Site Locations Pain Management and Medication Current Pain Management: Electronic Signature(s) Signed: 09/30/2022 11:42:53 AM By: Yevonne Pax RN Entered By: Yevonne Pax on 09/30/2022 08:54:26 -------------------------------------------------------------------------------- Patient/Caregiver Education Details Patient Name: Date of Service: Larry Callahan MES F. 4/19/2024andnbsp9:00 A M Medical Record Number: [id number] Patient Account Number: [id number] Date of Birth/Gender: Treating RN: Jan 25, 1956 (67 y.o. Melonie [place] Primary Care Physician: Sallyanne Kuster Other Clinician: Referring Physician: Treating Physician/Extender: Weldon Inches Weeks in Treatment: 6 Education Assessment Education Provided To: Patient Education Topics Provided Wound/Skin Impairment: Handouts: Caring for Your Ulcer Methods: Explain/Verbal Responses: State content correctly Electronic Signature(s) Signed: 09/30/2022 11:42:53 AM By: Yevonne Pax RN Entered By: Yevonne Pax on 09/30/2022 08:58:41 [place] ([id number])  [id number] Page 6 of 7 -------------------------------------------------------------------------------- Wound Assessment Details Patient Name: Date of Service: Larry Callahan MES F. 09/30/2022 9:00 A M Medical Record Number: [id number] Patient Account Number: [id number] Date of Birth/Sex: Treating RN: 06/22/1955 (67 y.o. Larry Callahan) Yevonne Pax Primary Care Bolden Hagerman: Sallyanne Kuster Other Clinician: Referring Jeyson Deshotel: Treating Maalik Pinn/Extender: Weldon Inches Weeks in Treatment: 6 Wound Status Wound Number: 2 Primary Abscess Etiology: Wound Location: Right Gluteus Wound Open Wounding Event: Gradually Appeared Status: Date Acquired: 08/29/2022 Comorbid Chronic Obstructive Pulmonary Disease (COPD), Arrhythmia, Weeks Of Treatment: 4 History: Coronary Artery Disease, Hypertension, Type II Diabetes Clustered Wound: No Photos Wound Measurements Length: (cm) 2 Width: (cm) 2.5 Depth: (cm) 0.4 Area: (cm) 3.927 Volume: (cm) 1.571 % Reduction in Area: 42.9% % Reduction in Volume: 23.8% Epithelialization: None Tunneling: No Undermining: No Wound Description Classification: Full Thickness Without Exposed Support Structures Exudate Amount: Medium Exudate Type: Serosanguineous Exudate Color: red, brown Foul Odor After Cleansing: No Slough/Fibrino No Wound Bed Granulation Amount: Large (67-100%) Exposed Structure Granulation Quality: Red Fascia Exposed: No Necrotic Amount: None Present (0%) Fat  Layer (Subcutaneous Tissue) Exposed: Yes Tendon Exposed: No Muscle Exposed: No Joint Exposed: No Bone Exposed: No Treatment Notes Wound #2 (Gluteus) Wound Laterality: Right Cleanser Soap and Water Discharge Instruction: Gently cleanse wound with antibacterial soap, rinse and pat dry prior to dressing wounds Peri-Wound Care Topical Primary Dressing Hydrofera Blue Ready Transfer Foam, 2.5x2.5 (in/in) Discharge Instruction: Apply Hydrofera Blue  Ready to wound bed as directed Secondary Dressing (BORDER) Zetuvit Plus SILICONE BORDER Dressing 4x4 (in/in) Discharge Instruction: Please do not put silicone bordered dressings under wraps. Use non-bordered dressing only. Secured With LENNIN, OSMOND ([id number]) [id number] Page 7 of 7 Compression Wrap Compression Stockings Add-Ons Electronic Signature(s) Signed: 09/30/2022 11:42:53 AM By: Yevonne Pax RN Entered By: Yevonne Pax on 09/30/2022 08:57:45 -------------------------------------------------------------------------------- Vitals Details Patient Name: Date of Service: Tracie Harrier, JA MES F. 09/30/2022 9:00 A M Medical Record Number: [id number] Patient Account Number: [id number] Date of Birth/Sex: Treating RN: 06-29-1955 (67 y.o. Larry Callahan) Yevonne Pax Primary Care Merrit Waugh: Sallyanne Kuster Other Clinician: Referring Dalesha Stanback: Treating Magan Winnett/Extender: Weldon Inches Weeks in Treatment: 6 Vital Signs Time Taken: 08:54 Temperature (F): 97.7 Height (in): 71 Pulse (bpm): 77 Weight (lbs): 229 Respiratory Rate (breaths/min): 18 Body Mass Index (BMI): 31.9 Blood Pressure (mmHg): 134/75 Reference Range: 80 - 120 mg / dl Electronic Signature(s) Signed: 09/30/2022 11:42:53 AM By: Yevonne Pax RN Entered By: Yevonne Pax on 09/30/2022 08:54:20

## 2022-09-30 NOTE — Progress Notes (Addendum)
TANNOR, PYON ([id number]) [id number] Page 1 of 5 Visit Report for 09/30/2022 Chief Complaint Document Details Patient Name: Date of Service: Larry Callahan MES F. 09/30/2022 9:00 A M Medical Record Number: [id number] Patient Account Number: [id number] Date of Birth/Sex: Treating RN: 1956/01/05 (67 y.o. [name]) Yevonne Pax Primary Care Provider: Sallyanne Kuster Other Clinician: Referring Provider: Treating Provider/Extender: Weldon Inches Weeks in Treatment: 6 Information Obtained from: Patient Chief Complaint Right gluteal abscess Electronic Signature(s) Signed: 09/30/2022 8:30:59 AM By: Allen Derry PA-C Entered By: Allen Derry on 09/30/2022 08:30:59 -------------------------------------------------------------------------------- HPI Details Patient Name: Date of Service: Larry Callahan MES F. 09/30/2022 9:00 A M Medical Record Number: [id number] Patient Account Number: [id number] Date of Birth/Sex: Treating RN: 1956-02-11 (67 y.o. Melonie [place] Primary Care Provider: Sallyanne Kuster Other Clinician: Referring Provider: Treating Provider/Extender: Weldon Inches Weeks in Treatment: 6 History of Present Illness HPI Description: ADMISSION 08/15/2022 This is a 67 year old man who came in with very little medical information. There was some suggestion that he was in the hospital last month with cellulitis of the arm I will need to check this out thoroughly. He tells me that he received 2 courses of antibiotics last month were not really sure which ones they were but they are completed now. He is not really having any discomfort. His sister who lives nearby is dressing this with what sounds like Coban. I looked through [facility] health Callahan. He was in the hospital about 3 weeks ago for an infection of the right elbow. Prior to that he had been in an urgent care receiving Keflex. I do not think he was admitted to hospital but may have  spent some time in the ER. I do not see any cultures. 3/11; patient presents for follow-up. He has been using Hydrofera Blue to the right elbow. The wound has healed. Readmission: 09-01-2022 upon evaluation today patient presents for reevaluation here in the clinic he actually was previously seen for his elbow today he comes in with the elbow okay but unfortunately a right gluteal abscess. Unfortunately he tells me that this is somewhat painful. He was seen in the ER they gave him doxycycline he has not picked that up yet that was just yesterday. 09-08-2022 upon evaluation today patient's wound actually appears to be excellent. In fact he called in stating that was bleeding profusely have a note in communications concerning that. Nonetheless the home health nurse and reassured me he was doing okay and this appears to be doing just fine today as well. I think it is actually showing less pressure compared to last time I saw him as far as the overall surrounding tissue and how things appear. 09-15-2022 upon evaluation today patient's wound unfortunately appears to be infected. I do believe that he is going to require intervention as far as antibiotics are concerned I am actually going to grab a wound culture today as well. 09-22-2022 upon evaluation today patient appears to be doing better in regard to his wound and actually very pleased with where things stand. Fortunately I do think that he is moving in the right direction. No fevers, chills, nausea, vomiting, or diarrhea. 09-30-2022 upon evaluation today patient appears to be doing well currently in regard to his wound. This is actually showing signs of improvement which is great news. I am very pleased with where we stand. I do not see any evidence of active infection locally nor systemically which is great news. The patient in general tells me that  he is still having some discomfort but this is not nearly as much as it was in the beginning. Overall I do  believe that we are moving in the right direction. Electronic Signature(s) Signed: 09/30/2022 9:27:01 AM By: Allen Derry PA-C Entered By: Allen Derry on 09/30/2022 09:27:01 [place] ([id number]) [id number] Page 2 of 5 -------------------------------------------------------------------------------- Physical Exam Details Patient Name: Date of Service: Larry Callahan MES F. 09/30/2022 9:00 A M Medical Record Number: [id number] Patient Account Number: [id number] Date of Birth/Sex: Treating RN: [birth year] (67 y.o. [name]) Yevonne Pax Primary Care Provider: Sallyanne Kuster Other Clinician: Referring Provider: Treating Provider/Extender: Weldon Inches Weeks in Treatment: 6 Constitutional Obese and well-hydrated in no acute distress. Respiratory normal breathing without difficulty. Psychiatric this patient is able to make decisions and demonstrates good insight into disease process. Alert and Oriented x 3. pleasant and cooperative. Notes Upon inspection patient's wound bed actually showed signs of good granulation epithelization at this point. Fortunately I do not see any evidence of active infection locally or systemically which is great news I do think the Hydrofera Blue is doing a good job. We just need to make sure that it is staying in place it appears that the dressing he had on previous came loose and it might have fallen on the bottom. Electronic Signature(s) Signed: 09/30/2022 9:27:35 AM By: Allen Derry PA-C Entered By: Allen Derry on 09/30/2022 09:27:35 -------------------------------------------------------------------------------- Physician Orders Details Patient Name: Date of Service: Larry Callahan MES F. 09/30/2022 9:00 A M Medical Record Number: [id number] Patient Account Number: [id number] Date of Birth/Sex: Treating RN: [birth year] (67 y.o. [name]) Yevonne Pax Primary Care Provider: Sallyanne Kuster Other Clinician: Referring  Provider: Treating Provider/Extender: Weldon Inches Weeks in Treatment: 6 Verbal / Phone Orders: No Diagnosis Coding ICD-10 Coding Code Description L02.31 Cutaneous abscess of buttock Follow-up Appointments Return Appointment in 1 week. Home Health [facility] for wound care. May utilize formulary equivalent dressing for wound treatment orders unless otherwise specified. Home Health Nurse may visit PRN to address patients wound care needs. Rolene Arbour [phone] Anesthetic (Use 'Patient Medications' Section for Anesthetic Order Entry) Lidocaine applied to wound bed Edema Control - Lymphedema / Segmental Compressive Device / Other Elevate, Exercise Daily and A void Standing for Long Periods of Time. Elevate legs to the level of the heart and pump ankles as often as possible Elevate leg(s) parallel to the floor when sitting. Wound Treatment Wound #2 - Gluteus Wound Laterality: Right Cleanser: Soap and Water 3 x Per Week/15 Days Discharge Instructions: Gently cleanse wound with antibacterial soap, rinse and pat dry prior to dressing wounds Prim Dressing: Hydrofera Blue Ready Transfer Foam, 2.5x2.5 (in/in) (Generic) 3 x Per Week/15 Days ary Discharge Instructions: Apply Hydrofera Blue Ready to wound bed as directed Secondary Dressing: T Adhesive [employer], 4x4 (in/in) 3 x Per Week/15 Days elfa Discharge Instructions: Apply over dressing to secure in place. ALASSANE, KALAFUT ([id number]) [id number] Page 3 of 5 Electronic Signature(s) Signed: 09/30/2022 11:42:53 AM By: Yevonne Pax RN Signed: 09/30/2022 1:44:50 PM By: Allen Derry PA-C Previous Signature: 09/30/2022 9:09:15 AM Version By: Yevonne Pax RN Entered By: Yevonne Pax on 09/30/2022 09:12:41 -------------------------------------------------------------------------------- Problem List Details Patient Name: Date of Service: Larry Callahan MES F. 09/30/2022 9:00 A  M Medical Record Number: [id number] Patient Account Number: [id number] Date of Birth/Sex: Treating RN: 08-06-1955 (67 y.o. Melonie [place] Primary Care Provider: Sallyanne Kuster Other Clinician: Referring Provider: Treating Provider/Extender: Weldon Inches Weeks in Treatment:  6 Active Problems ICD-10 Encounter Code Description Active Date MDM Diagnosis L02.31 Cutaneous abscess of buttock 08/15/2022 No Yes Inactive Problems Resolved Problems Electronic Signature(s) Signed: 09/30/2022 8:30:57 AM By: Allen Derry PA-C Entered By: Allen Derry on 09/30/2022 08:30:56 -------------------------------------------------------------------------------- Progress Note Details Patient Name: Date of Service: Larry Callahan MES F. 09/30/2022 9:00 A M Medical Record Number: [id number] Patient Account Number: [id number] Date of Birth/Sex: Treating RN: January 23, 1956 (67 y.o. [name]) Yevonne Pax Primary Care Provider: Sallyanne Kuster Other Clinician: Referring Provider: Treating Provider/Extender: Weldon Inches Weeks in Treatment: 6 Subjective Chief Complaint Information obtained from Patient Right gluteal abscess History of Present Illness (HPI) ADMISSION 08/15/2022 This is a 67 year old man who came in with very little medical information. There was some suggestion that he was in the hospital last month with cellulitis of the arm I will need to check this out thoroughly. He tells me that he received 2 courses of antibiotics last month were not really sure which ones they were but they are completed now. He is not really having any discomfort. His sister who lives nearby is dressing this with what sounds like Coban. I looked through [facility] health Callahan. He was in the hospital about 3 weeks ago for an infection of the right elbow. Prior to that he had been in an urgent care receiving Keflex. I do not think he was admitted to hospital but may have spent some time in the ER. I do not  see any cultures. 3/11; patient presents for follow-up. He has been using Hydrofera Blue to the right elbow. The wound has healed. Readmission: 09-01-2022 upon evaluation today patient presents for reevaluation here in the clinic he actually was previously seen for his elbow today he comes in with the elbow okay but unfortunately a right gluteal abscess. Unfortunately he tells me that this is somewhat painful. He was seen in the ER they gave him doxycycline he has not picked that up yet that was just yesterday. 09-08-2022 upon evaluation today patient's wound actually appears to be excellent. In fact he called in stating that was bleeding profusely have a note in communications concerning that. Nonetheless the home health nurse and reassured me he was doing okay and this appears to be doing just fine today as well. I think it is actually showing less pressure compared to last time I saw him as far as the overall surrounding tissue and how things appear. LYNDEN, FLEMMER ([id number]) [id number] Page 4 of 5 09-15-2022 upon evaluation today patient's wound unfortunately appears to be infected. I do believe that he is going to require intervention as far as antibiotics are concerned I am actually going to grab a wound culture today as well. 09-22-2022 upon evaluation today patient appears to be doing better in regard to his wound and actually very pleased with where things stand. Fortunately I do think that he is moving in the right direction. No fevers, chills, nausea, vomiting, or diarrhea. 09-30-2022 upon evaluation today patient appears to be doing well currently in regard to his wound. This is actually showing signs of improvement which is great news. I am very pleased with where we stand. I do not see any evidence of active infection locally nor systemically which is great news. The patient in general tells me that he is still having some discomfort but this is not nearly as  much as it was in the beginning. Overall I do believe that we are moving in the right direction. Objective Constitutional Obese and  well-hydrated in no acute distress. Vitals Time Taken: 8:54 AM, Height: 71 in, Weight: 229 lbs, BMI: 31.9, Temperature: 97.7 F, Pulse: 77 bpm, Respiratory Rate: 18 breaths/min, Blood Pressure: 134/75 mmHg. Respiratory normal breathing without difficulty. Psychiatric this patient is able to make decisions and demonstrates good insight into disease process. Alert and Oriented x 3. pleasant and cooperative. General Notes: Upon inspection patient's wound bed actually showed signs of good granulation epithelization at this point. Fortunately I do not see any evidence of active infection locally or systemically which is great news I do think the Hydrofera Blue is doing a good job. We just need to make sure that it is staying in place it appears that the dressing he had on previous came loose and it might have fallen on the bottom. Integumentary (Hair, Skin) Wound #2 status is Open. Original cause of wound was Gradually Appeared. The date acquired was: 08/29/2022. The wound has been in treatment 4 weeks. The wound is located on the Right Gluteus. The wound measures 2cm length x 2.5cm width x 0.4cm depth; 3.927cm^2 area and 1.571cm^3 volume. There is Fat Layer (Subcutaneous Tissue) exposed. There is no tunneling or undermining noted. There is a medium amount of serosanguineous drainage noted. There is large (67-100%) red granulation within the wound bed. There is no necrotic tissue within the wound bed. Assessment Active Problems ICD-10 Cutaneous abscess of buttock Plan Follow-up Appointments: Return Appointment in 1 week. Home Health: [facility] for wound care. May utilize formulary equivalent dressing for wound treatment orders unless otherwise specified. Home Health Nurse may visit PRN to address patientoos wound care needs. Rolene Arbour  [phone] Anesthetic (Use 'Patient Medications' Section for Anesthetic Order Entry): Lidocaine applied to wound bed Edema Control - Lymphedema / Segmental Compressive Device / Other: Elevate, Exercise Daily and Avoid Standing for Long Periods of Time. Elevate legs to the level of the heart and pump ankles as often as possible Elevate leg(s) parallel to the floor when sitting. WOUND #2: - Gluteus Wound Laterality: Right Cleanser: Soap and Water 3 x Per Week/15 Days Discharge Instructions: Gently cleanse wound with antibacterial soap, rinse and pat dry prior to dressing wounds Prim Dressing: Hydrofera Blue Ready Transfer Foam, 2.5x2.5 (in/in) (Generic) 3 x Per Week/15 Days ary Discharge Instructions: Apply Hydrofera Blue Ready to wound bed as directed Secondary Dressing: T Adhesive [employer], 4x4 (in/in) 3 x Per Week/15 Days elfa Discharge Instructions: Apply over dressing to secure in place. 1. I am good recommend that we have the patient continue to monitor for any signs of infection or worsening. Based on what I am seeing I do believe that we are moving in the right direction however. 2. I am also can recommend that the patient should continue with the T island dressing which I think is doing a really good job here. elfa JONAVAN, VANHORN ([id number]) [id number] Page 5 of 5 3. I think that he should continue with appropriate offloading is still to be of utmost concern. We will see patient back for reevaluation in 1 week here in the clinic. If anything worsens or changes patient will contact our office for additional recommendations. Electronic Signature(s) Signed: 09/30/2022 9:28:04 AM By: Allen Derry PA-C Entered By: Allen Derry on 09/30/2022 09:28:04 -------------------------------------------------------------------------------- SuperBill Details Patient Name: Date of Service: Tracie Harrier, JA MES F. 09/30/2022 Medical Record Number:  [id number] Patient Account Number: [id number] Date of Birth/Sex: Treating RN: 1956/01/26 (67 y.o. [name]) Yevonne Pax Primary Care Provider: Sallyanne Kuster Other Clinician: Referring Provider:  Treating Provider/Extender: Weldon Inches Weeks in Treatment: 6 Diagnosis Coding ICD-10 Codes Code Description L02.31 Cutaneous abscess of buttock Facility Procedures : CPT4 Code: [id number] Description: [id number] - WOUND CARE VISIT-LEV 2 EST PT Modifier: Quantity: 1 Physician Procedures : CPT4 Code Description Modifier [id number] - WC PHYS LEVEL 3 - EST PT ICD-10 Diagnosis Description L02.31 Cutaneous abscess of buttock Quantity: 1 Electronic Signature(s) Signed: 09/30/2022 9:28:26 AM By: Allen Derry PA-C Previous Signature: 09/30/2022 9:09:54 AM Version By: Yevonne Pax RN Entered By: Allen Derry on 09/30/2022 29:56:21

## 2022-10-01 IMAGING — MR MR HEAD W/O CM
10 series · 48 of 48 positions shown · non-contrast
Comparison: CT from earlier the same day.

CLINICAL DATA: Initial evaluation for neuro deficit, stroke
suspected.

EXAM:
MRI HEAD WITHOUT CONTRAST
TECHNIQUE: Multiplanar, multiecho pulse sequences of the brain and surrounding
structures were obtained without intravenous contrast.

[Series 2: ax dwi_tracew · axial · 3.0mm · 1.31mm/px · z∈[-99,+52]mm · 7 of 48 slices shown]
[im 1/48]
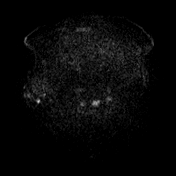
[im 8/48]
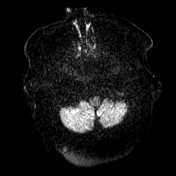
[im 16/48]
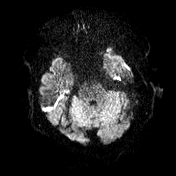
[im 24/48]
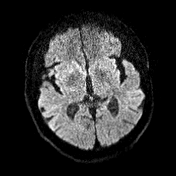
[im 32/48]
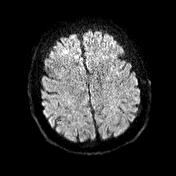
[im 40/48]
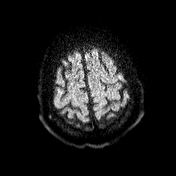
[im 48/48]
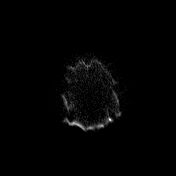

[Series 3: ax dwi_adc · axial · 3.0mm · 1.31mm/px · z∈[-99,+52]mm · 7 of 47 slices shown]
[im 1/47]
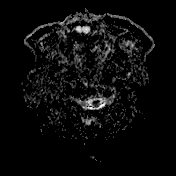
[im 8/47]
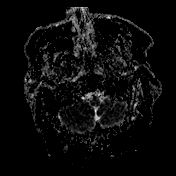
[im 16/47]
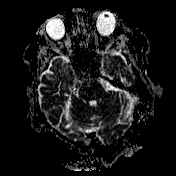
[im 24/47]
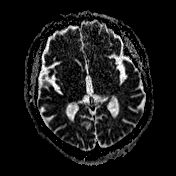
[im 31/47]
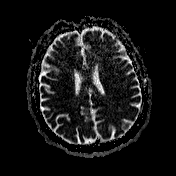
[im 39/47]
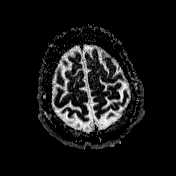
[im 47/47]
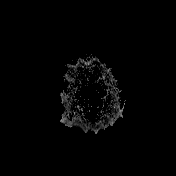

[Series 4: cor dwi_tracew · coronal · 5.0mm · 1.31mm/px · 5 of 38 slices shown]
[im 1/38]
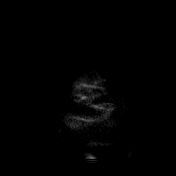
[im 10/38]
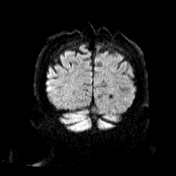
[im 19/38]
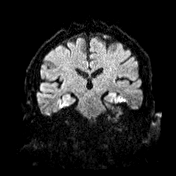
[im 28/38]
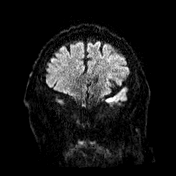
[im 38/38]
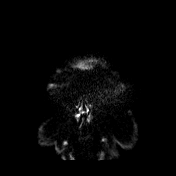

[Series 5: cor dwi_adc · coronal · 5.0mm · 1.31mm/px · 5 of 38 slices shown]
[im 1/38]
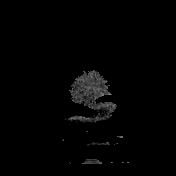
[im 10/38]
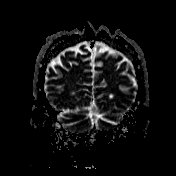
[im 19/38]
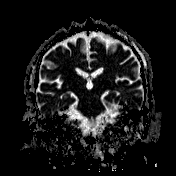
[im 28/38]
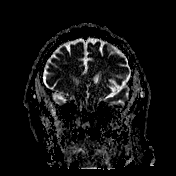
[im 38/38]
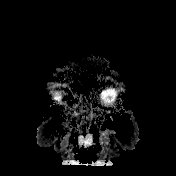

[Series 6: T1 · sagittal · 5.0mm · 0.94mm/px · 3 of 24 slices shown (1 of 2)]
[im 1/24]
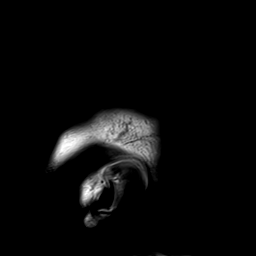
[im 12/24]
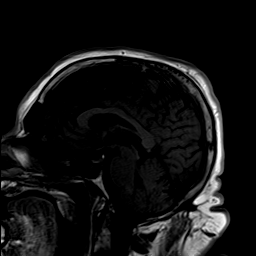
[im 24/24]
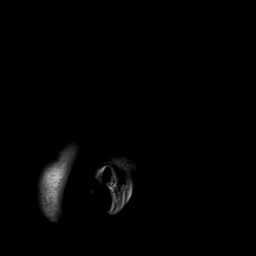

[Series 7: T2 · axial · 5.0mm · 0.45mm/px · z∈[-105,+53]mm · 4 of 28 slices shown (1 of 2)]
[im 1/28]
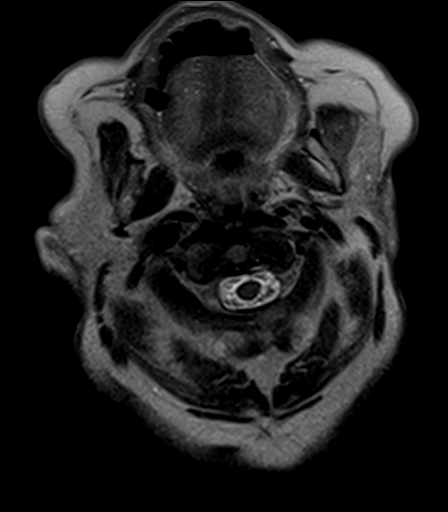
[im 10/28]
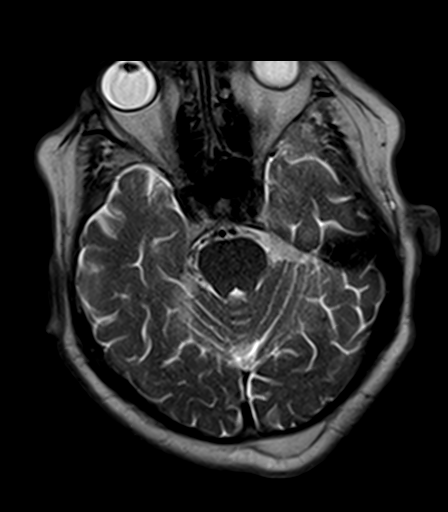
[im 19/28]
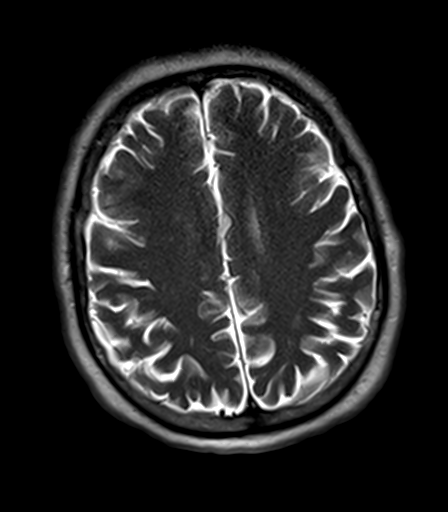
[im 28/28]
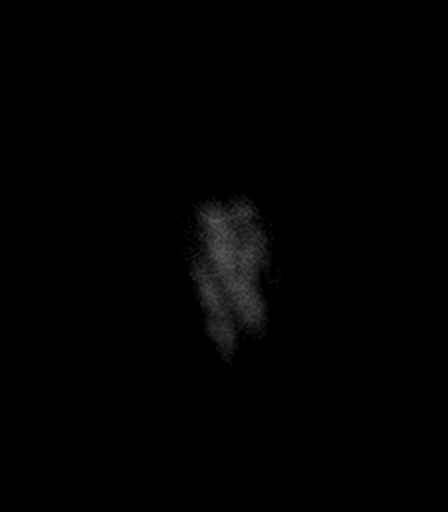

[Series 8: T2-star · axial · 5.0mm · 0.45mm/px · z∈[-105,+53]mm · 4 of 28 slices shown]
[im 1/28]
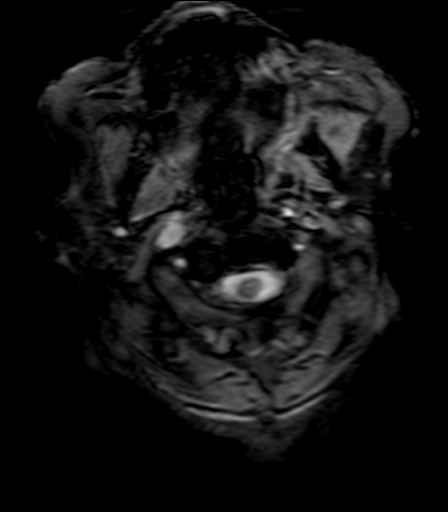
[im 10/28]
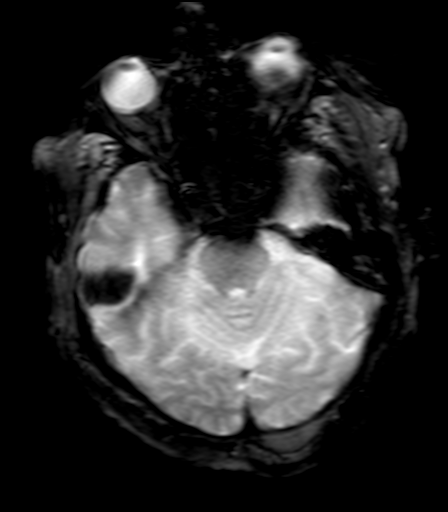
[im 19/28]
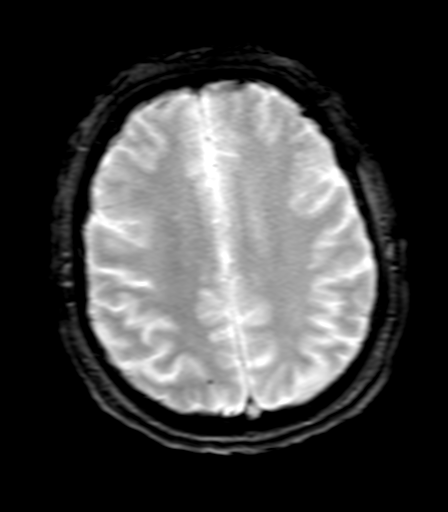
[im 28/28]
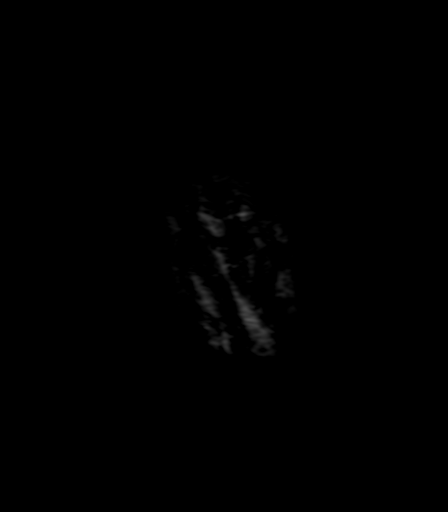

[Series 9: FLAIR · axial · 5.0mm · 1.20mm/px · z∈[-105,+53]mm · 4 of 28 slices shown]
[im 1/28]
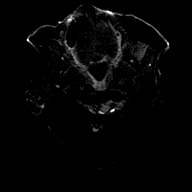
[im 10/28]
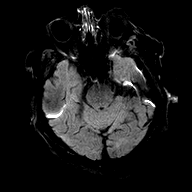
[im 19/28]
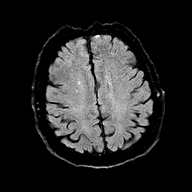
[im 28/28]
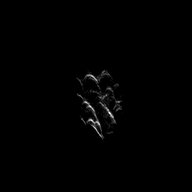

[Series 10: T1 · axial · 5.0mm · 0.90mm/px · z∈[-105,+53]mm · 4 of 28 slices shown (2 of 2)]
[im 1/28]
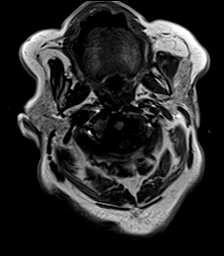
[im 10/28]
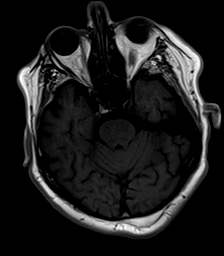
[im 19/28]
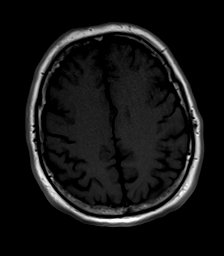
[im 28/28]
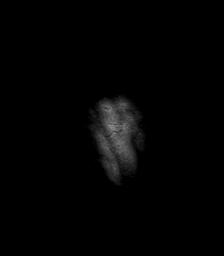

[Series 11: T2 · coronal · 5.0mm · 0.45mm/px · 5 of 32 slices shown (2 of 2)]
[im 1/32]
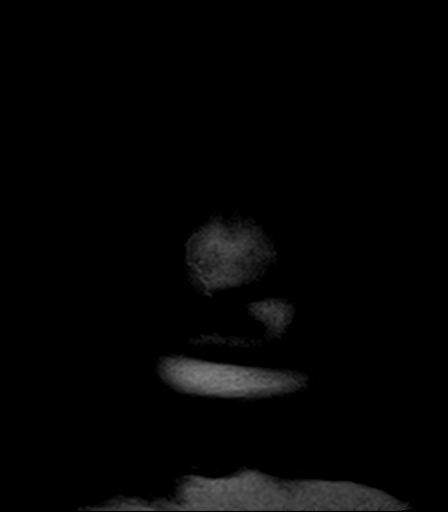
[im 8/32]
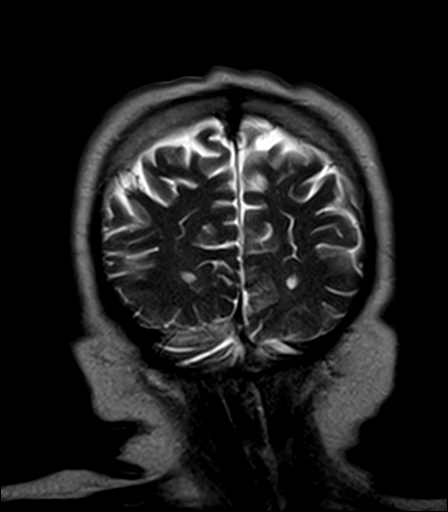
[im 16/32]
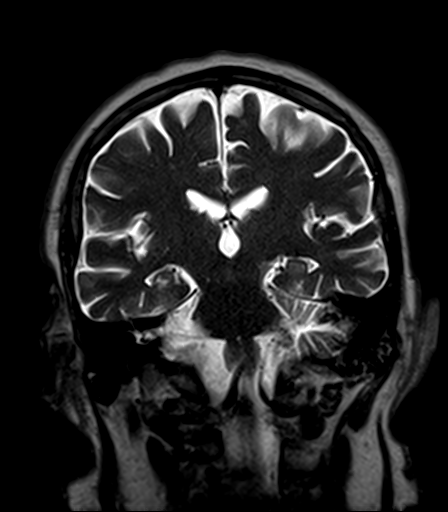
[im 24/32]
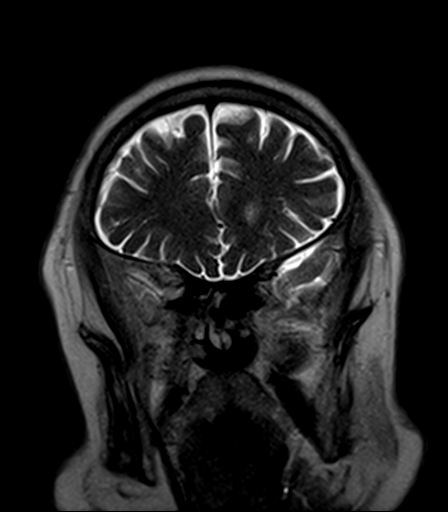
[im 32/32]
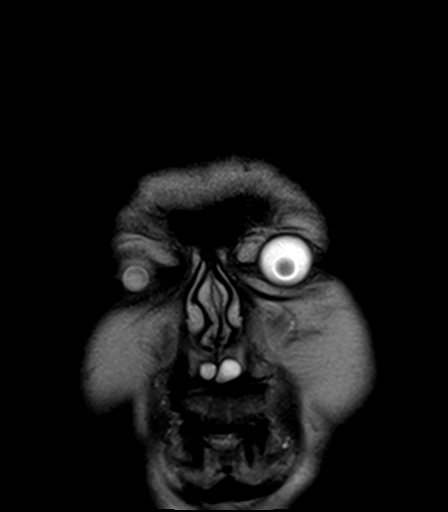

[48 of 48 positions shown; findings below may reference images not displayed]

FINDINGS: Brain: Cerebral volume within normal limits. Mild chronic
microvascular ischemic disease noted involving the periventricular
white matter. No abnormal foci of restricted diffusion to suggest
acute or subacute ischemia. Gray-white matter differentiation
maintained. No areas of remote cortical infarction. No acute
intracranial hemorrhage. Single punctate chronic microhemorrhage
noted at the right parietal lobe, of doubtful significance in
isolation.

No mass lesion, midline shift or mass effect. No hydrocephalus or
extra-axial fluid collection. Pituitary gland suprasellar region
within normal limits. Midline structures intact.

Vascular: Major intracranial vascular flow voids are maintained.

Skull and upper cervical spine: Craniocervical junction within
normal limits. Bone marrow signal intensity within normal limits. No
scalp soft tissue abnormality.

Sinuses/Orbits: Globes and orbital soft tissues demonstrate no acute
finding. Paranasal sinuses are largely clear. Small possible
dentigerous cysts small left greater than right mastoid effusions.
Visualized nasopharynx unremarkable. Inner ear structures grossly
normal. Noted at the maxilla, of doubtful significance.

Other: None.
IMPRESSION: 1. No acute intracranial abnormality.
2. Mild chronic microvascular ischemic disease for age.

## 2022-10-02 DIAGNOSIS — G4733 Obstructive sleep apnea (adult) (pediatric): Secondary | ICD-10-CM | POA: Diagnosis not present

## 2022-10-03 ENCOUNTER — Telehealth: Payer: Self-pay | Admitting: *Deleted

## 2022-10-03 ENCOUNTER — Telehealth: Payer: Self-pay

## 2022-10-03 DIAGNOSIS — I509 Heart failure, unspecified: Secondary | ICD-10-CM | POA: Diagnosis not present

## 2022-10-03 DIAGNOSIS — G4733 Obstructive sleep apnea (adult) (pediatric): Secondary | ICD-10-CM | POA: Diagnosis not present

## 2022-10-03 DIAGNOSIS — L0231 Cutaneous abscess of buttock: Secondary | ICD-10-CM | POA: Diagnosis not present

## 2022-10-03 DIAGNOSIS — J449 Chronic obstructive pulmonary disease, unspecified: Secondary | ICD-10-CM | POA: Diagnosis not present

## 2022-10-03 DIAGNOSIS — Z602 Problems related to living alone: Secondary | ICD-10-CM | POA: Diagnosis not present

## 2022-10-03 DIAGNOSIS — E119 Type 2 diabetes mellitus without complications: Secondary | ICD-10-CM | POA: Diagnosis not present

## 2022-10-03 DIAGNOSIS — I2510 Atherosclerotic heart disease of native coronary artery without angina pectoris: Secondary | ICD-10-CM | POA: Diagnosis not present

## 2022-10-03 DIAGNOSIS — I110 Hypertensive heart disease with heart failure: Secondary | ICD-10-CM | POA: Diagnosis not present

## 2022-10-03 DIAGNOSIS — I5022 Chronic systolic (congestive) heart failure: Secondary | ICD-10-CM

## 2022-10-03 DIAGNOSIS — E785 Hyperlipidemia, unspecified: Secondary | ICD-10-CM | POA: Diagnosis not present

## 2022-10-03 DIAGNOSIS — I10 Essential (primary) hypertension: Secondary | ICD-10-CM

## 2022-10-03 NOTE — Patient Outreach (Addendum)
Received a referral from Dione Booze for Mr. Aird for care coordination. I have sent this referral to the [facility] Care Guides to assign a Nurse Care Coordinator to call.   Iverson Alamin, Donivan Scull [facility] Care Management Assistant [facility] Care Management [phone]

## 2022-10-03 NOTE — Telephone Encounter (Signed)
        Patient  visited  Hospital on 09/28/2022  for treat ment    Telephone encounter attempt :  2nd  No answer Larry Callahan -[facility] [place], Population [facility] [phone] [address] [place] , [name] [address] Email : [email or web address]

## 2022-10-03 NOTE — Progress Notes (Deleted)
  Cardiology Office Note:   Date:  10/03/2022  ID:  Larry Callahan, DOB 1956/04/02, MRN [id number]  History of Present Illness:   Larry Callahan is a 67 y.o. male with a past medical history of HFrEF, hypertension, nonischemic cardiomyopathy, type II diabetes, typical atrial flutter, mixed hyperlipidemia, smoker, chronic renal insufficiency, obstructive sleep apnea, chronic chest pain, who is here today for follow up.  He had originally presented to the emergency room in September 2021 with shortness of breath on exertion, chronic chest pain.July 2022 he was found to be in atrial flutter that has persisted since that time.  Echocardiogram revealed LVEF of 40-45%.  There was noted chronic confusion on medications as he had a visiting nurse that puts his medications in his pillbox.  He had recently been seen in the emergency department on August 20 23 for chest pain has his primary nurse return to go to the emergency department for symptoms he was discharged home after negative workup.  They detailed 7 hospitalizations within the last 6 months and 10 emergency department visits.  He had had frequent emergency department visits for various issues.  He was last seen in clinic on 02/09/2022 by Dr. Mariah Milling.  Where was still having some confusion about his medication and has had multiple ER visits.  At that time he was ordered a repeat Myoview to rule out any ischemia due to the active chest pain that he had previously been having.  Since his last clinic visit he has had 14 visits to the emergency department of hospital admissions for various complaints. Last admission was 09/22/22 for constipation, leg swelling, and shortness of breath. VQ scan negative for PE after having elevated d-dimer, Hs troponin 17. He was continued on his current medications and was considered stable for discharge on 09/24/22. He was again evaluated in the emergency department for right leg swelling. He was discharged after an  unremarkable work-up.   He returns to clinic today  ROS: ***  Studies Reviewed:    EKG:  ***  ***  Risk Assessment/Calculations:    CHA2DS2-VASc Score =    {Click here to calculate score.  REFRESH note before signing. :1} This indicates a  % annual risk of stroke. The patient's score is based upon:      No BP recorded.  {Refresh Note OR Click here to enter BP  :1}***        Physical Exam:   VS:  There were no vitals taken for this visit.   Wt Readings from Last 3 Encounters:  09/28/22 211 lb (95.7 kg)  09/22/22 200 lb (90.7 kg)  09/13/22 213 lb 13.5 oz (97 kg)     GEN: Well nourished, well developed in no acute distress NECK: No JVD; No carotid bruits CARDIAC: ***RRR, no murmurs, rubs, gallops RESPIRATORY:  Clear to auscultation without rales, wheezing or rhonchi  ABDOMEN: Soft, non-tender, non-distended EXTREMITIES:  No edema; No deformity   ASSESSMENT AND PLAN:   ***    {Are you ordering a CV Procedure (e.g. stress test, cath, DCCV, TEE, etc)?   Press F2        :[id number]}   Signed, Austin Herd, NP

## 2022-10-04 ENCOUNTER — Ambulatory Visit: Payer: Medicare PPO | Attending: Cardiology | Admitting: Cardiology

## 2022-10-04 ENCOUNTER — Encounter: Payer: Self-pay | Admitting: Cardiology

## 2022-10-04 ENCOUNTER — Ambulatory Visit: Payer: Medicare PPO | Admitting: Cardiology

## 2022-10-04 VITALS — BP 120/72 | HR 89 | Ht 71.0 in | Wt 209.5 lb

## 2022-10-04 DIAGNOSIS — I5020 Unspecified systolic (congestive) heart failure: Secondary | ICD-10-CM

## 2022-10-04 DIAGNOSIS — I10 Essential (primary) hypertension: Secondary | ICD-10-CM | POA: Diagnosis not present

## 2022-10-04 DIAGNOSIS — I483 Typical atrial flutter: Secondary | ICD-10-CM | POA: Diagnosis not present

## 2022-10-04 DIAGNOSIS — E1122 Type 2 diabetes mellitus with diabetic chronic kidney disease: Secondary | ICD-10-CM

## 2022-10-04 DIAGNOSIS — N184 Chronic kidney disease, stage 4 (severe): Secondary | ICD-10-CM

## 2022-10-04 DIAGNOSIS — E782 Mixed hyperlipidemia: Secondary | ICD-10-CM | POA: Diagnosis not present

## 2022-10-04 DIAGNOSIS — Z794 Long term (current) use of insulin: Secondary | ICD-10-CM | POA: Diagnosis not present

## 2022-10-04 DIAGNOSIS — I2510 Atherosclerotic heart disease of native coronary artery without angina pectoris: Secondary | ICD-10-CM | POA: Diagnosis not present

## 2022-10-04 DIAGNOSIS — I428 Other cardiomyopathies: Secondary | ICD-10-CM | POA: Diagnosis not present

## 2022-10-04 MED ORDER — DICLOFENAC SODIUM 1 % EX GEL: CUTANEOUS | 1 refills | Status: DC

## 2022-10-04 NOTE — Patient Instructions (Addendum)
Medication Instructions:  - Your physician has recommended you make the following change in your medication:   1) Voltaren Gel/ Cream: apply to left arm & should twice daily as needed for pain  *If you need a refill on your cardiac medications before your next appointment, please call your pharmacy*   Lab Work: - none ordered  If you have labs (blood work) drawn today and your tests are completely normal, you will receive your results only by: MyChart Message (if you have MyChart) OR A paper copy in the mail If you have any lab test that is abnormal or we need to change your treatment, we will call you to review the results.   Testing/Procedures: - none ordered   Follow-Up: At [facility], you and your health needs are our priority.  As part of our continuing mission to provide you with exceptional heart care, we have created designated Provider Care Teams.  These Care Teams include your primary Cardiologist (physician) and Advanced Practice Providers (APPs -  Physician Assistants and Nurse Practitioners) who all work together to provide you with the care you need, when you need it.  We recommend signing up for the patient portal called "MyChart".  Sign up information is provided on this After Visit Summary.  MyChart is used to connect with patients for Virtual Visits (Telemedicine).  Patients are able to view lab/test results, encounter notes, upcoming appointments, etc.  Non-urgent messages can be sent to your provider as well.   To learn more about what you can do with MyChart, go to [email or web address].    Your next appointment:   As scheduled with Clarisa Kindred, NP ([facility]) & Dr. Mariah Milling   Provider:   As Above     Other Instructions  Diclofenac Gel What is this medication? DICLOFENAC (dye KLOE fen ak) treats joint pain caused by arthritis. It works by decreasing inflammation. It belongs to a group of medications called NSAIDs. This medicine may be used for  other purposes; ask your health care provider or pharmacist if you have questions. COMMON BRAND NAME(S): Aspercreme Arthritis Pain Reliever, DICLOPREP, DSG Pak, Motrin Arthritis Pain, Omeca, Solaravix, Solaraze, ValcoPrep-100, VennGel One, Voltaren Arthritis, Voltaren Gel What should I tell my care team before I take this medication? They need to know if you have any of these conditions: Bleeding disorders Coronary artery bypass graft (CABG) within the past 2 weeks Heart attack Heart disease Heart failure High blood pressure If you often drink alcohol Kidney disease Large area of burned or damaged skin Liver disease Low red blood cell counts Lung or breathing disease (asthma) Receiving steroids like dexamethasone or prednisone Smoke cigarettes Skin conditions or sensitivity Stomach bleeding Stomach or intestine problems Take medications that treat or prevent blood clots An unusual or allergic reaction to diclofenac, other medications, foods, dyes, or preservatives Pregnant or trying to get pregnant Breast-feeding How should I use this medication? This medication is for external use only. Do not take by mouth. Wash your hands before and after use. If you are treating your hands, only wash your hands before use. Do not get it in your eyes. If you do, rinse your eyes with plenty of cool tap water. Use it as directed on the prescription label at the same time every day. Do not use it more often than directed. Keep taking it unless your care team tells you to stop. Apply a thin film of the medication to the affected area. If you are using Voltaren or Venngel  One, they come with INSTRUCTIONS FOR USE. Ask your pharmacist for directions on how to use this medication. Read the information carefully. Talk to your pharmacist or care team if you have questions. A special MedGuide will be given to you by the pharmacist with each prescription and refill. Be sure to read this information carefully each  time. Talk to your care team about the use of this medication in children. Special care may be needed. Overdosage: If you think you have taken too much of this medicine contact a poison control center or emergency room at once. NOTE: This medicine is only for you. Do not share this medicine with others. What if I miss a dose? If you are using Voltaren or Venngel One: If you miss a dose, skip it. Use your next dose at the normal time. Do not use extra or 2 doses at the same time to make up for the missed dose. If you are using Solaraze: If you miss a dose, use it as soon as you can. If it is almost time for your next dose, use only that dose. Do not use double or extra doses. What may interact with this medication? Aspirin NSAIDs, medications for pain and inflammation, like ibuprofen or naproxen This list may not describe all possible interactions. Give your health care provider a list of all the medicines, herbs, non-prescription drugs, or dietary supplements you use. Also tell them if you smoke, drink alcohol, or use illegal drugs. Some items may interact with your medicine. What should I watch for while using this medication? Visit your care team for regular checks on your progress. Tell your care team if your symptoms do not start to get better or if they get worse. Do not take other medications that contain aspirin, ibuprofen, or naproxen with this medication. Side effects such as stomach upset, nausea, or ulcers may be more likely to occur. Many non-prescription medications contain aspirin, ibuprofen, or naproxen. Always read labels carefully. This medication can cause serious ulcers and bleeding in the stomach. It can happen with no warning. Smoking, drinking alcohol, older age, and poor health can also increase risks. Call your care team right away if you have stomach pain or blood in your vomit or stool. This medication does not prevent a heart attack or stroke. This medication may increase  the chance of a heart attack or stroke. The chance may increase the longer you use this medication or if you have heart disease. If you take aspirin to prevent a heart attack or stroke, talk to your care team about using this medication. Alcohol may interfere with the effect of this medication. Avoid alcoholic drinks. This medication may cause serious skin reactions. They can happen weeks to months after starting the medication. Contact your care team right away if you notice fevers or flu-like symptoms with a rash. The rash may be red or purple and then turn into blisters or peeling of the skin. Or, you might notice a red rash with swelling of the face, lips or lymph nodes in your neck or under your arms. Talk to your care team if you are pregnant before taking this medication. Taking this medication between weeks 20 and 30 of pregnancy may harm your unborn baby. Your care team will monitor you closely if you need to take it. After 30 weeks of pregnancy, do not take this medication. You may get drowsy or dizzy. Do not drive, use machinery, or do anything that needs mental alertness until you  know how this medication affects you. Do not stand up or sit up quickly, especially if you are an older patient. This reduces the risk of dizzy or fainting spells. Be careful brushing or flossing your teeth or using a toothpick because you may get an infection or bleed more easily. If you have any dental work done, tell your dentist you are receiving this medication. This medication may make it more difficult to get pregnant. Talk to your care team if you are concerned about your fertility. What side effects may I notice from receiving this medication? Side effects that you should report to your care team as soon as possible: Allergic reactions--skin rash, itching, hives, swelling of the face, lips, tongue, or throat Bleeding--bloody or black, tar-like stools, vomiting blood or brown material that looks like coffee  grounds, red or dark brown urine, small red or purple spots on skin, unusual bruising or bleeding Heart attack--pain or tightness in the chest, shoulders, arms, or jaw, nausea, shortness of breath, cold or clammy skin, feeling faint or lightheaded Heart failure--shortness of breath, swelling of ankles, feet, or hands, sudden weight gain, unusual weakness or fatigue Increase in blood pressure Kidney injury--decrease in the amount of urine, swelling of the ankles, hands, or feet Liver injury--right upper belly pain, loss of appetite, nausea, light-colored stool, dark yellow or brown urine, yellowing skin or eyes, unusual weakness or fatigue Rash, fever, and swollen lymph nodes Redness, blistering, peeling, or loosening of the skin, including inside the mouth Stroke--sudden numbness or weakness of the face, arm, or leg, trouble speaking, confusion, trouble walking, loss of balance or coordination, dizziness, severe headache, change in vision Side effects that usually do not require medical attention (report to your care team if they continue or are bothersome): Headache Irritation at application site Loss of appetite Nausea Upset stomach This list may not describe all possible side effects. Call your doctor for medical advice about side effects. You may report side effects to FDA at 1-800-FDA-1088. Where should I keep my medication? Keep out of the reach of children and pets. Store at room temperature between 15 and 30 degrees C (59 and 86 degrees F). Do not freeze. Protect from heat. Get rid of any unused medication after the expiration date. To get rid of medications that are no longer needed or have expired: Take the medication to a medication take-back program. Check with your pharmacy or law enforcement to find a location. If you cannot return the medication, check the label or package insert to see if the medication should be thrown out in the garbage or flushed down the toilet. If you are  not sure, ask your care team. If it is safe to put it in the trash, empty the medication out of the container. Mix the medication with cat litter, dirt, coffee grounds, or other unwanted substance. Seal the mixture in a bag or container. Put it in the trash. NOTE: This sheet is a summary. It may not cover all possible information. If you have questions about this medicine, talk to your doctor, pharmacist, or health care provider.  2023 Elsevier/Gold Standard (2020-06-05 00:00:00)

## 2022-10-04 NOTE — Progress Notes (Signed)
Cardiology Office Note:   Date:  10/04/2022  ID:  Larry Callahan, DOB 06-11-1956, MRN [id number]  History of Present Illness:   Larry Callahan is a 67 y.o. male with a past medical history of HFrEF, hypertension, nonischemic cardiomyopathy, type II diabetes, typical atrial flutter, mixed hyperlipidemia, smoker, chronic renal insufficiency, obstructive sleep apnea, chronic chest pain, who is here today for follow up.   He had originally presented to the emergency room in September 2021 with shortness of breath on exertion, chronic chest pain.July 2022 he was found to be in atrial flutter that has persisted since that time.  Echocardiogram revealed LVEF of 40-45%.  There was noted chronic confusion on medications as he had a visiting nurse that puts his medications in his pillbox.  He had recently been seen in the emergency department on August 20 23 for chest pain has his primary nurse return to go to the emergency department for symptoms he was discharged home after negative workup.  They detailed 7 hospitalizations within the last 6 months and 10 emergency department visits.  He had had frequent emergency department visits for various issues.   He was last seen in clinic on 02/09/2022 by Dr. Mariah Milling.  Where was still having some confusion about his medication and has had multiple ER visits.  At that time he was ordered a repeat Myoview to rule out any ischemia due to the active chest pain that he had previously been having.   Since his last clinic visit he has had 14 visits to the emergency department of hospital admissions for various complaints. Last admission was 09/22/22 for constipation, leg swelling, and shortness of breath. VQ scan negative for PE after having elevated d-dimer, Hs troponin 17. He was continued on his current medications and was considered stable for discharge on 09/24/22. He was again evaluated in the emergency department for right leg swelling. He was discharged after  an unremarkable work-up.   He returns to clinic today with complaints of left shoulder pain which she had previously been advised to get follow-up with Ortho and declined.  Swelling in his left hand, weight loss as he has lost 5 pounds this month, and occasional swelling to his bilateral lower extremities.  He states he has been compliant with his medications, but there is some questioning regarding some medications and if he is taking them as prescribed.  His blood pressure is better controlled today and he states that he has been feeling his pillboxes at home to ensure that he does take his medications as he had previously been instructed by Dr. Mariah Milling.  He has his chronic chest discomfort that is a prickly feeling not related to exertion, occasional shortness of breath, peripheral edema, and occasional lightheadedness.  He has had multiple ER visits and hospital admissions.  As of late he had a wound to the right elbow and was treated for cellulitis and continues to follow with wound care for that as well as wound he states he is on his sacrum.  ROS: 10 point review of systems has been reviewed and is considered negative with exception of what is listed in the HPI  Studies Reviewed:    EKG: Atrial flutter with variable AV block at a rate of 90 with PVC or aberrancy, acute change from prior studies  Myoview 02/15/22   Abnormal pharmacologic pharmacologic myocardial perfusion stress test.   There is a small in size, moderate, in severity, essentially fixed apical and apical inferior defect that could represent  infarct or artifact.   No obvious ischemia is seen, though study quality is poor.   Left ventricular systolic function is mild-moderately reduced (LVEF 40-50%).   Coronary artery calcification is noted on the attenuation correction CT.   Study quality is poor due to multiple artifacts.  Consider alternative modality such as PET/CT if concern for myocardial ischemia persists.   The study is  intermediate risk.  Risk Assessment/Calculations:    CHA2DS2-VASc Score = 5   This indicates a 7.2% annual risk of stroke. The patient's score is based upon: CHF History: 1 HTN History: 1 Diabetes History: 1 Stroke History: 0 Vascular Disease History: 1 Age Score: 1 Gender Score: 0             Physical Exam:   VS:  BP 120/72 (BP Location: Left Arm, Patient Position: Sitting, Cuff Size: Large)   Pulse 89   Ht 5\' 11"  (1.803 m)   Wt 209 lb 8 oz (95 kg)   SpO2 95%   BMI 29.22 kg/m    Wt Readings from Last 3 Encounters:  10/04/22 209 lb 8 oz (95 kg)  09/28/22 211 lb (95.7 kg)  09/22/22 200 lb (90.7 kg)     GEN: Well nourished, well developed in no acute distress NECK: No JVD; No carotid bruits CARDIAC: IR, no murmurs, rubs, gallops RESPIRATORY:  Clear to auscultation without rales, wheezing or rhonchi  ABDOMEN: Soft, non-tender, non-distended EXTREMITIES:  No edema; No deformity   ASSESSMENT AND PLAN:   HFrEF/ischemic cardiomyopathy with an LVEF of 40-45%.  He appears to be euvolemic on exam today.  He is continued on carvedilol 12.5 mg twice daily, furosemide 40 mg daily.  He is not a candidate for ACE/ARB/ARNI, SGLT2 inhibitor, or MRA due to kidney function.  States that his shortness of breath has improved.  He continues to complain of swelling to his bilateral lower extremities that has improved today.  He recently had an emergency department visit for swelling into his right lower extremity which had a venous ultrasound that was negative for DVT.  Essential hypertension with a blood pressure today 120/72.  He is continued on carvedilol, Lasix, hydralazine.  He is also encouraged to continue with monitoring his pressure at home.  Medication compliance has been reiterated with him again today.  Coronary artery disease with chronic chest pain symptoms.  Pressures (2022 revealed no ischemia.  Has had multiple trips to the emergency department for various symptoms.  Last heart  catheterization was completed in 2011.  he has no active chest discomfort today (his blood pressure is very well-controlled today which have an impact).  He is continued on apixaban and lieu of aspirin and rosuvastatin.  Typical atrial flutter that he is asymptomatic.  Continued in a flutter on EKG today.  He is continued on apixaban 5 mg twice daily for CHA2DS2-VASc score of at least 5 stroke prophylaxis.  He also denies any instances of noticing blood in his urine or stool.  He is continued on carvedilol 12.5 mg twice daily for rate control.  Mixed hyperlipidemia with Lpa 166.9.  They have repeated lipid panel on return.  He has been continued on rosuvastatin 10 mg daily at bedtime.  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  He is continued glipizide and insulin.  This continues to be managed by his PCP.  Left arm pain and chronic left shoulder pain.  Patient states he has not had any injury or incident to the shoulder.  It is documented throughout his record the discomfort to the left shoulder has been chronic.  He has been advised to try Voltaren gel to see if that would help with some of his discomfort.  Disposition patient return to clinic to see MD/APP in 4 months or sooner if needed        Signed, Dominica Kent, NP

## 2022-10-05 DIAGNOSIS — Z602 Problems related to living alone: Secondary | ICD-10-CM | POA: Diagnosis not present

## 2022-10-05 DIAGNOSIS — L0231 Cutaneous abscess of buttock: Secondary | ICD-10-CM | POA: Diagnosis not present

## 2022-10-05 DIAGNOSIS — I2510 Atherosclerotic heart disease of native coronary artery without angina pectoris: Secondary | ICD-10-CM | POA: Diagnosis not present

## 2022-10-05 DIAGNOSIS — E785 Hyperlipidemia, unspecified: Secondary | ICD-10-CM | POA: Diagnosis not present

## 2022-10-05 DIAGNOSIS — I509 Heart failure, unspecified: Secondary | ICD-10-CM | POA: Diagnosis not present

## 2022-10-05 DIAGNOSIS — J449 Chronic obstructive pulmonary disease, unspecified: Secondary | ICD-10-CM | POA: Diagnosis not present

## 2022-10-05 DIAGNOSIS — I110 Hypertensive heart disease with heart failure: Secondary | ICD-10-CM | POA: Diagnosis not present

## 2022-10-05 DIAGNOSIS — E119 Type 2 diabetes mellitus without complications: Secondary | ICD-10-CM | POA: Diagnosis not present

## 2022-10-05 DIAGNOSIS — G4733 Obstructive sleep apnea (adult) (pediatric): Secondary | ICD-10-CM | POA: Diagnosis not present

## 2022-10-06 ENCOUNTER — Telehealth: Payer: Self-pay | Admitting: *Deleted

## 2022-10-06 ENCOUNTER — Telehealth: Payer: Self-pay | Admitting: Nurse Practitioner

## 2022-10-06 DIAGNOSIS — G4733 Obstructive sleep apnea (adult) (pediatric): Secondary | ICD-10-CM | POA: Diagnosis not present

## 2022-10-06 DIAGNOSIS — E785 Hyperlipidemia, unspecified: Secondary | ICD-10-CM | POA: Diagnosis not present

## 2022-10-06 DIAGNOSIS — I509 Heart failure, unspecified: Secondary | ICD-10-CM | POA: Diagnosis not present

## 2022-10-06 DIAGNOSIS — Z602 Problems related to living alone: Secondary | ICD-10-CM | POA: Diagnosis not present

## 2022-10-06 DIAGNOSIS — J449 Chronic obstructive pulmonary disease, unspecified: Secondary | ICD-10-CM | POA: Diagnosis not present

## 2022-10-06 DIAGNOSIS — E119 Type 2 diabetes mellitus without complications: Secondary | ICD-10-CM | POA: Diagnosis not present

## 2022-10-06 DIAGNOSIS — I2510 Atherosclerotic heart disease of native coronary artery without angina pectoris: Secondary | ICD-10-CM | POA: Diagnosis not present

## 2022-10-06 DIAGNOSIS — I110 Hypertensive heart disease with heart failure: Secondary | ICD-10-CM | POA: Diagnosis not present

## 2022-10-06 DIAGNOSIS — L0231 Cutaneous abscess of buttock: Secondary | ICD-10-CM | POA: Diagnosis not present

## 2022-10-06 NOTE — Telephone Encounter (Signed)
Per Marchelle Folks w/ [facility], referral has been closed due to patient not replying to calls to schedule-[name]

## 2022-10-06 NOTE — Progress Notes (Signed)
  Care Coordination   Note   10/06/2022 Name: Larry Callahan MRN: [id number] DOB: 1955/10/30  Larry Callahan is a 67 y.o. year old male who sees Larry Kuster, NP for primary care. I reached out to Larry Callahan by phone today to offer care coordination services.  [name] was given information about Care Coordination services today including:   The Care Coordination services include support from the care team which includes your Nurse Coordinator, Clinical Social Worker, or Pharmacist.  The Care Coordination team is here to help remove barriers to the health concerns and goals most important to you. Care Coordination services are voluntary, and the patient may decline or stop services at any time by request to their care team member.   Care Coordination Consent Status: Patient agreed to services and verbal consent obtained.   Follow up plan:  Telephone appointment with care coordination team member scheduled for:  10/10/2022  Encounter Outcome:  Pt. Scheduled from referral   Larry Callahan, [facility] Care Coordination Care Guide Direct Dial: [phone]

## 2022-10-07 ENCOUNTER — Encounter: Payer: Medicare PPO | Admitting: Physician Assistant

## 2022-10-07 DIAGNOSIS — L0231 Cutaneous abscess of buttock: Secondary | ICD-10-CM | POA: Diagnosis not present

## 2022-10-08 NOTE — Progress Notes (Signed)
Larry, Callahan ([id number]) [id number] Page 1 of 5 Visit Report for 10/07/2022 Chief Complaint Document Details Patient Name: Date of Service: Larry Callahan MES F. 10/07/2022 9:45 A M Medical Record Number: [id number] Patient Account Number: [id number] Date of Birth/Sex: Treating RN: 06/28/1955 (67 y.o. Larry Callahan) Yevonne Pax Primary Care Provider: Sallyanne Kuster Other Clinician: Referring Provider: Treating Provider/Extender: Larry Callahan Weeks in Treatment: 7 Information Obtained from: Patient Chief Complaint Right gluteal abscess Electronic Signature(s) Signed: 10/07/2022 9:36:01 AM By: Allen Derry PA-C Entered By: Allen Derry on 10/07/2022 09:36:01 -------------------------------------------------------------------------------- HPI Details Patient Name: Date of Service: Larry Callahan MES F. 10/07/2022 9:45 A M Medical Record Number: [id number] Patient Account Number: [id number] Date of Birth/Sex: Treating RN: 04-24-1956 (67 y.o. Larry Callahan Primary Care Provider: Sallyanne Kuster Other Clinician: Referring Provider: Treating Provider/Extender: Larry Callahan Weeks in Treatment: 7 History of Present Illness HPI Description: ADMISSION 08/15/2022 This is a 67 year old man who came in with very little medical information. There was some suggestion that he was in the hospital last month with cellulitis of the arm I will need to check this out thoroughly. He tells me that he received 2 courses of antibiotics last month were not really sure which ones they were but they are completed now. He is not really having any discomfort. His sister who lives nearby is dressing this with what sounds like Coban. I looked through [facility] health Callahan. He was in the hospital about 3 weeks ago for an infection of the right elbow. Prior to that he had been in an urgent care receiving Keflex. I do not think he was admitted to hospital but may have  spent some time in the ER. I do not see any cultures. 3/11; patient presents for follow-up. He has been using Hydrofera Blue to the right elbow. The wound has healed. Readmission: 09-01-2022 upon evaluation today patient presents for reevaluation here in the clinic he actually was previously seen for his elbow today he comes in with the elbow okay but unfortunately a right gluteal abscess. Unfortunately he tells me that this is somewhat painful. He was seen in the ER they gave him doxycycline he has not picked that up yet that was just yesterday. 09-08-2022 upon evaluation today patient's wound actually appears to be excellent. In fact he called in stating that was bleeding profusely have a note in communications concerning that. Nonetheless the home health nurse and reassured me he was doing okay and this appears to be doing just fine today as well. I think it is actually showing less pressure compared to last time I saw him as far as the overall surrounding tissue and how things appear. 09-15-2022 upon evaluation today patient's wound unfortunately appears to be infected. I do believe that he is going to require intervention as far as antibiotics are concerned I am actually going to grab a wound culture today as well. 09-22-2022 upon evaluation today patient appears to be doing better in regard to his wound and actually very pleased with where things stand. Fortunately I do think that he is moving in the right direction. No fevers, chills, nausea, vomiting, or diarrhea. 09-30-2022 upon evaluation today patient appears to be doing well currently in regard to his wound. This is actually showing signs of improvement which is great news. I am very pleased with where we stand. I do not see any evidence of active infection locally nor systemically which is great news. The patient in general tells me that  he is still having some discomfort but this is not nearly as much as it was in the beginning. Overall I do  believe that we are moving in the right direction. 10-07-2022 upon evaluation today patient appears to be doing well currently in regard to his wound which is showing signs of improvement. Fortunately I do not see any evidence of active infection locally nor systemically which is great news. No fevers, chills, nausea, vomiting, or diarrhea. Electronic Signature(s) Signed: 10/07/2022 9:42:54 AM By: Allen Derry PA-C Entered By: Allen Derry on 10/07/2022 09:42:54 [place] ([id number]) [id number] Page 2 of 5 -------------------------------------------------------------------------------- Physical Exam Details Patient Name: Date of Service: Larry Callahan MES F. 10/07/2022 9:45 A M Medical Record Number: [id number] Patient Account Number: [id number] Date of Birth/Sex: Treating RN: 1955-11-04 (67 y.o. Larry Callahan) Yevonne Pax Primary Care Provider: Sallyanne Kuster Other Clinician: Referring Provider: Treating Provider/Extender: Larry Callahan Weeks in Treatment: 7 Constitutional Well-nourished and well-hydrated in no acute distress. Respiratory normal breathing without difficulty. Psychiatric this patient is able to make decisions and demonstrates good insight into disease process. Alert and Oriented x 3. pleasant and cooperative. Notes Upon inspection patient's wound bed actually showed signs of good granulation epithelization at this point. Fortunately I do not see any evidence of active infection locally nor systemically which is great news and overall I do believe that we are moving in the right direction. No fevers, chills, nausea, vomiting, or diarrhea. Electronic Signature(s) Signed: 10/07/2022 9:43:31 AM By: Allen Derry PA-C Entered By: Allen Derry on 10/07/2022 09:43:30 -------------------------------------------------------------------------------- Physician Orders Details Patient Name: Date of Service: Larry Callahan MES F. 10/07/2022 9:45 A  M Medical Record Number: [id number] Patient Account Number: [id number] Date of Birth/Sex: Treating RN: [birth year] (67 y.o. Larry Callahan) Yevonne Pax Primary Care Provider: Sallyanne Kuster Other Clinician: Referring Provider: Treating Provider/Extender: Larry Callahan Weeks in Treatment: 7 Verbal / Phone Orders: No Diagnosis Coding ICD-10 Coding Code Description L02.31 Cutaneous abscess of buttock Follow-up Appointments Return Appointment in 1 week. Home Health [facility] for wound care. May utilize formulary equivalent dressing for wound treatment orders unless otherwise specified. Home Health Nurse may visit PRN to address patients wound care needs. Rolene Arbour [phone] Anesthetic (Use 'Patient Medications' Section for Anesthetic Order Entry) Lidocaine applied to wound bed Edema Control - Lymphedema / Segmental Compressive Device / Other Elevate, Exercise Daily and A void Standing for Long Periods of Time. Elevate legs to the level of the heart and pump ankles as often as possible Elevate leg(s) parallel to the floor when sitting. Wound Treatment Wound #2 - Gluteus Wound Laterality: Right Cleanser: Soap and Water 3 x Per Week/15 Days Discharge Instructions: Gently cleanse wound with antibacterial soap, rinse and pat dry prior to dressing wounds Prim Dressing: Hydrofera Blue Ready Transfer Foam, 2.5x2.5 (in/in) (Generic) 3 x Per Week/15 Days ary Discharge Instructions: Apply Hydrofera Blue Ready to wound bed as directed Secondary Dressing: T Adhesive [employer], 4x4 (in/in) 3 x Per Week/15 Days elfa Discharge Instructions: Apply over dressing to secure in place. Larry Callahan, Larry Callahan ([id number]) [id number] Page 3 of 5 Electronic Signature(s) Signed: 10/07/2022 12:43:01 PM By: Allen Derry PA-C Signed: 10/10/2022 11:25:56 AM By: Yevonne Pax RN Entered By: Yevonne Pax on 10/07/2022  09:40:16 -------------------------------------------------------------------------------- Problem List Details Patient Name: Date of Service: Larry Callahan MES F. 10/07/2022 9:45 A M Medical Record Number: [id number] Patient Account Number: [id number] Date of Birth/Sex: Treating RN: 1956-03-12 (67 y.o. Larry Callahan) Yevonne Pax Primary Care Provider: Tedd Sias,  Alyssa Other Clinician: Referring Provider: Treating Provider/Extender: Larry Callahan Weeks in Treatment: 7 Active Problems ICD-10 Encounter Code Description Active Date MDM Diagnosis L02.31 Cutaneous abscess of buttock 08/15/2022 No Yes Inactive Problems Resolved Problems Electronic Signature(s) Signed: 10/07/2022 9:35:58 AM By: Allen Derry PA-C Entered By: Allen Derry on 10/07/2022 09:35:57 -------------------------------------------------------------------------------- Progress Note Details Patient Name: Date of Service: Larry Callahan MES F. 10/07/2022 9:45 A M Medical Record Number: [id number] Patient Account Number: [id number] Date of Birth/Sex: Treating RN: Nov 30, 1955 (67 y.o. Larry Callahan Primary Care Provider: Sallyanne Kuster Other Clinician: Referring Provider: Treating Provider/Extender: Larry Callahan Weeks in Treatment: 7 Subjective Chief Complaint Information obtained from Patient Right gluteal abscess History of Present Illness (HPI) ADMISSION 08/15/2022 This is a 67 year old man who came in with very little medical information. There was some suggestion that he was in the hospital last month with cellulitis of the arm I will need to check this out thoroughly. He tells me that he received 2 courses of antibiotics last month were not really sure which ones they were but they are completed now. He is not really having any discomfort. His sister who lives nearby is dressing this with what sounds like Coban. I looked through [facility] health Callahan. He was in the hospital about 3 weeks ago for an  infection of the right elbow. Prior to that he had been in an urgent care receiving Keflex. I do not think he was admitted to hospital but may have spent some time in the ER. I do not see any cultures. 3/11; patient presents for follow-up. He has been using Hydrofera Blue to the right elbow. The wound has healed. Readmission: 09-01-2022 upon evaluation today patient presents for reevaluation here in the clinic he actually was previously seen for his elbow today he comes in with the elbow okay but unfortunately a right gluteal abscess. Unfortunately he tells me that this is somewhat painful. He was seen in the ER they gave him doxycycline he has not picked that up yet that was just yesterday. 09-08-2022 upon evaluation today patient's wound actually appears to be excellent. In fact he called in stating that was bleeding profusely have a note in communications concerning that. Nonetheless the home health nurse and reassured me he was doing okay and this appears to be doing just fine today as well. Larry Callahan, Larry Callahan ([id number]) [id number] Page 4 of 5 I think it is actually showing less pressure compared to last time I saw him as far as the overall surrounding tissue and how things appear. 09-15-2022 upon evaluation today patient's wound unfortunately appears to be infected. I do believe that he is going to require intervention as far as antibiotics are concerned I am actually going to grab a wound culture today as well. 09-22-2022 upon evaluation today patient appears to be doing better in regard to his wound and actually very pleased with where things stand. Fortunately I do think that he is moving in the right direction. No fevers, chills, nausea, vomiting, or diarrhea. 09-30-2022 upon evaluation today patient appears to be doing well currently in regard to his wound. This is actually showing signs of improvement which is great news. I am very pleased with where we stand. I do  not see any evidence of active infection locally nor systemically which is great news. The patient in general tells me that he is still having some discomfort but this is not nearly as much as it was in the beginning. Overall I  do believe that we are moving in the right direction. 10-07-2022 upon evaluation today patient appears to be doing well currently in regard to his wound which is showing signs of improvement. Fortunately I do not see any evidence of active infection locally nor systemically which is great news. No fevers, chills, nausea, vomiting, or diarrhea. Objective Constitutional Well-nourished and well-hydrated in no acute distress. Vitals Time Taken: 9:30 AM, Height: 71 in, Weight: 229 lbs, BMI: 31.9, Temperature: 98.1 F, Pulse: 97 bpm, Respiratory Rate: 18 breaths/min, Blood Pressure: 148/75 mmHg. Respiratory normal breathing without difficulty. Psychiatric this patient is able to make decisions and demonstrates good insight into disease process. Alert and Oriented x 3. pleasant and cooperative. General Notes: Upon inspection patient's wound bed actually showed signs of good granulation epithelization at this point. Fortunately I do not see any evidence of active infection locally nor systemically which is great news and overall I do believe that we are moving in the right direction. No fevers, chills, nausea, vomiting, or diarrhea. Integumentary (Hair, Skin) Wound #2 status is Open. Original cause of wound was Gradually Appeared. The date acquired was: 08/29/2022. The wound has been in treatment 5 weeks. The wound is located on the Right Gluteus. The wound measures 2cm length x 2.3cm width x 0.3cm depth; 3.613cm^2 area and 1.084cm^3 volume. There is Fat Layer (Subcutaneous Tissue) exposed. There is no tunneling or undermining noted. There is a medium amount of serosanguineous drainage noted. There is large (67-100%) red granulation within the wound bed. There is no necrotic  tissue within the wound bed. Assessment Active Problems ICD-10 Cutaneous abscess of buttock Plan Follow-up Appointments: Return Appointment in 1 week. Home Health: [facility] for wound care. May utilize formulary equivalent dressing for wound treatment orders unless otherwise specified. Home Health Nurse may visit PRN to address patientoos wound care needs. Rolene Arbour [phone] Anesthetic (Use 'Patient Medications' Section for Anesthetic Order Entry): Lidocaine applied to wound bed Edema Control - Lymphedema / Segmental Compressive Device / Other: Elevate, Exercise Daily and Avoid Standing for Long Periods of Time. Elevate legs to the level of the heart and pump ankles as often as possible Elevate leg(s) parallel to the floor when sitting. WOUND #2: - Gluteus Wound Laterality: Right Cleanser: Soap and Water 3 x Per Week/15 Days Discharge Instructions: Gently cleanse wound with antibacterial soap, rinse and pat dry prior to dressing wounds Prim Dressing: Hydrofera Blue Ready Transfer Foam, 2.5x2.5 (in/in) (Generic) 3 x Per Week/15 Days ary Discharge Instructions: Apply Hydrofera Blue Ready to wound bed as directed Secondary Dressing: T Adhesive [employer], 4x4 (in/in) 3 x Per Week/15 Days elfa Discharge Instructions: Apply over dressing to secure in place. Larry Callahan, Larry Callahan ([id number]) [id number] Page 5 of 5 1. I do believe that the patient is making good progress in general. I do not see any signs of infection which is great news I feel like the [facility] is doing a good job. 2. I am going to recommend as well that the patient should continue to monitor for any signs of infection or worsening. Obviously the more he can do with appropriate offloading the better. 3. He does need to try to clean this area really well and keep it from causing any inflammation, infection, or breakdown. We will see patient back for reevaluation in 1 week  here in the clinic. If anything worsens or changes patient will contact our office for additional recommendations. Electronic Signature(s) Signed: 10/07/2022 9:44:03 AM By: Allen Derry PA-C Entered By:  Allen Derry on 10/07/2022 09:44:03 -------------------------------------------------------------------------------- SuperBill Details Patient Name: Date of Service: Larry Callahan MES F. 10/07/2022 Medical Record Number: [id number] Patient Account Number: [id number] Date of Birth/Sex: Treating RN: Jun 22, 1955 (67 y.o. Larry Callahan) Yevonne Pax Primary Care Provider: Sallyanne Kuster Other Clinician: Referring Provider: Treating Provider/Extender: Larry Callahan Weeks in Treatment: 7 Diagnosis Coding ICD-10 Codes Code Description L02.31 Cutaneous abscess of buttock Facility Procedures : CPT4 Code: [id number] Description: [phone] - WOUND CARE VISIT-LEV 2 EST PT Modifier: Quantity: 1 Physician Procedures : CPT4 Code Description Modifier [id number] - WC PHYS LEVEL 3 - EST PT ICD-10 Diagnosis Description L02.31 Cutaneous abscess of buttock Quantity: 1 Electronic Signature(s) Signed: 10/07/2022 9:45:10 AM By: Allen Derry PA-C Entered By: Allen Derry on 10/07/2022 09:45:10

## 2022-10-10 ENCOUNTER — Ambulatory Visit: Payer: Self-pay

## 2022-10-10 DIAGNOSIS — E785 Hyperlipidemia, unspecified: Secondary | ICD-10-CM | POA: Diagnosis not present

## 2022-10-10 DIAGNOSIS — I2510 Atherosclerotic heart disease of native coronary artery without angina pectoris: Secondary | ICD-10-CM | POA: Diagnosis not present

## 2022-10-10 DIAGNOSIS — G4733 Obstructive sleep apnea (adult) (pediatric): Secondary | ICD-10-CM | POA: Diagnosis not present

## 2022-10-10 DIAGNOSIS — I509 Heart failure, unspecified: Secondary | ICD-10-CM | POA: Diagnosis not present

## 2022-10-10 DIAGNOSIS — J449 Chronic obstructive pulmonary disease, unspecified: Secondary | ICD-10-CM | POA: Diagnosis not present

## 2022-10-10 DIAGNOSIS — E119 Type 2 diabetes mellitus without complications: Secondary | ICD-10-CM | POA: Diagnosis not present

## 2022-10-10 DIAGNOSIS — I110 Hypertensive heart disease with heart failure: Secondary | ICD-10-CM | POA: Diagnosis not present

## 2022-10-10 DIAGNOSIS — L0231 Cutaneous abscess of buttock: Secondary | ICD-10-CM | POA: Diagnosis not present

## 2022-10-10 DIAGNOSIS — Z602 Problems related to living alone: Secondary | ICD-10-CM | POA: Diagnosis not present

## 2022-10-10 NOTE — Progress Notes (Signed)
Larry Callahan ([id number]) [id number] Page 1 of 7 Visit Report for 10/07/2022 Arrival Information Details Patient Name: Date of Service: Larry Callahan MES F. 10/07/2022 9:45 A M Medical Record Number: [id number] Patient Account Number: [id number] Date of Birth/Sex: Treating RN: Jun 10, 1956 (67 y.o. Larry Callahan) Yevonne Pax Primary Care Shambhavi Salley: Sallyanne Kuster Other Clinician: Referring Loree Shehata: Treating Jereme Loren/Extender: Weldon Inches Weeks in Treatment: 7 Visit Information History Since Last Visit Added or deleted any medications: No Patient Arrived: Ambulatory Any new allergies or adverse reactions: No Arrival Time: 09:27 Had a fall or experienced change in No Accompanied By: self activities of daily living that may affect Transfer Assistance: None risk of falls: Patient Identification Verified: Yes Signs or symptoms of abuse/neglect since last visito No Secondary Verification Process Completed: Yes Hospitalized since last visit: No Patient Requires Transmission-Based Precautions: No Implantable device outside of the clinic excluding No Patient Has Alerts: Yes cellular tissue based products placed in the center Patient Alerts: Patient on Blood Thinner since last visit: Has Dressing in Place as Prescribed: Yes Pain Present Now: No Electronic Signature(s) Signed: 10/10/2022 11:25:56 AM By: Yevonne Pax RN Entered By: Yevonne Pax on 10/07/2022 09:30:34 -------------------------------------------------------------------------------- Clinic Level of Care Assessment Details Patient Name: Date of Service: Larry Callahan MES F. 10/07/2022 9:45 A M Medical Record Number: [id number] Patient Account Number: [id number] Date of Birth/Sex: Treating RN: July 01, 1955 (67 y.o. Larry Callahan) Yevonne Pax Primary Care Latronda Spink: Sallyanne Kuster Other Clinician: Referring Cintya Daughety: Treating Sonia Bromell/Extender: Weldon Inches Weeks in Treatment: 7 Clinic  Level of Care Assessment Items TOOL 4 Quantity Score X- 1 0 Use when only an EandM is performed on FOLLOW-UP visit ASSESSMENTS - Nursing Assessment / Reassessment X- 1 10 Reassessment of Co-morbidities (includes updates in patient status) X- 1 5 Reassessment of Adherence to Treatment Plan ASSESSMENTS - Wound and Skin A ssessment / Reassessment X - Simple Wound Assessment / Reassessment - one wound 1 5 []  - 0 Complex Wound Assessment / Reassessment - multiple wounds []  - 0 Dermatologic / Skin Assessment (not related to wound area) ASSESSMENTS - Focused Assessment []  - 0 Circumferential Edema Measurements - multi extremities []  - 0 Nutritional Assessment / Counseling / Intervention []  - 0 Lower Extremity Assessment (monofilament, tuning fork, pulses) []  - 0 Peripheral Arterial Disease Assessment (using hand held doppler) ASSESSMENTS - Ostomy and/or Continence Assessment and Care []  - 0 Incontinence Assessment and Management []  - 0 Ostomy Care Assessment and Management (repouching, etc.) PROCESS - Coordination of Care X - Simple Patient / Family Education for ongoing care 1 [address] [place] ([id number]) [id number] Page 2 of 7 []  - 0 Complex (extensive) Patient / Family Education for ongoing care []  - 0 Staff obtains Chiropractor, Records, T Results / Process Orders est []  - 0 Staff telephones HHA, Nursing Homes / Clarify orders / etc []  - 0 Routine Transfer to another Facility (non-emergent condition) []  - 0 Routine Hospital Admission (non-emergent condition) []  - 0 New Admissions / Manufacturing engineer / Ordering NPWT Apligraf, etc. , []  - 0 Emergency Hospital Admission (emergent condition) X- 1 10 Simple Discharge Coordination []  - 0 Complex (extensive) Discharge Coordination PROCESS - Special Needs []  - 0 Pediatric / Minor Patient Management []  - 0 Isolation Patient Management []  - 0 Hearing / Language / Visual special needs []  -  0 Assessment of Community assistance (transportation, D/C planning, etc.) []  - 0 Additional assistance / Altered mentation []  - 0 Support Surface(s) Assessment (bed, cushion, seat, etc.) INTERVENTIONS -  Wound Cleansing / Measurement X - Simple Wound Cleansing - one wound 1 5 []  - 0 Complex Wound Cleansing - multiple wounds X- 1 5 Wound Imaging (photographs - any number of wounds) []  - 0 Wound Tracing (instead of photographs) X- 1 5 Simple Wound Measurement - one wound []  - 0 Complex Wound Measurement - multiple wounds INTERVENTIONS - Wound Dressings X - Small Wound Dressing one or multiple wounds 1 10 []  - 0 Medium Wound Dressing one or multiple wounds []  - 0 Large Wound Dressing one or multiple wounds []  - 0 Application of Medications - topical []  - 0 Application of Medications - injection INTERVENTIONS - Miscellaneous []  - 0 External ear exam []  - 0 Specimen Collection (cultures, biopsies, blood, body fluids, etc.) []  - 0 Specimen(s) / Culture(s) sent or taken to Lab for analysis []  - 0 Patient Transfer (multiple staff / Nurse, adult / Similar devices) []  - 0 Simple Staple / Suture removal (25 or less) []  - 0 Complex Staple / Suture removal (26 or more) []  - 0 Hypo / Hyperglycemic Management (close monitor of Blood Glucose) []  - 0 Ankle / Brachial Index (ABI) - do not check if billed separately X- 1 5 Vital Signs Has the patient been seen at the hospital within the last three years: Yes Total Score: 75 Level Of Care: New/Established - Level 2 Electronic Signature(s) Signed: 10/10/2022 11:25:56 AM By: Yevonne Pax RN Entered By: Yevonne Pax on 10/07/2022 09:40:40 [place] ([id number]) [id number] Page 3 of 7 -------------------------------------------------------------------------------- Encounter Discharge Information Details Patient Name: Date of Service: Larry Callahan MES F. 10/07/2022 9:45 A M Medical Record Number:  [id number] Patient Account Number: [id number] Date of Birth/Sex: Treating RN: 1955/07/04 (67 y.o. Larry Callahan) Yevonne Pax Primary Care Matraca Hunkins: Sallyanne Kuster Other Clinician: Referring Zong Mcquarrie: Treating Ceaser Ebeling/Extender: Weldon Inches Weeks in Treatment: 7 Encounter Discharge Information Items Discharge Condition: Stable Ambulatory Status: Ambulatory Discharge Destination: Home Transportation: Private Auto Accompanied By: self Schedule Follow-up Appointment: Yes Clinical Summary of Care: Electronic Signature(s) Signed: 10/10/2022 11:25:56 AM By: Yevonne Pax RN Entered By: Yevonne Pax on 10/07/2022 09:41:35 -------------------------------------------------------------------------------- Lower Extremity Assessment Details Patient Name: Date of Service: Larry Callahan MES F. 10/07/2022 9:45 A M Medical Record Number: [id number] Patient Account Number: [id number] Date of Birth/Sex: Treating RN: May 31, 1956 (67 y.o. Larry Callahan) Yevonne Pax Primary Care Kawthar Ennen: Sallyanne Kuster Other Clinician: Referring Olufemi Mofield: Treating Licet Dunphy/Extender: Weldon Inches Weeks in Treatment: 7 Electronic Signature(s) Signed: 10/10/2022 11:25:56 AM By: Yevonne Pax RN Entered By: Yevonne Pax on 10/07/2022 09:33:47 -------------------------------------------------------------------------------- Multi Wound Chart Details Patient Name: Date of Service: Larry Callahan MES F. 10/07/2022 9:45 A M Medical Record Number: [id number] Patient Account Number: [id number] Date of Birth/Sex: Treating RN: 13-Sep-1955 (67 y.o. Larry Callahan) Yevonne Pax Primary Care Shirly Bartosiewicz: Sallyanne Kuster Other Clinician: Referring Saniyah Mondesir: Treating Anthonee Gelin/Extender: Weldon Inches Weeks in Treatment: 7 Vital Signs Height(in): 71 Pulse(bpm): 97 Weight(lbs): 229 Blood Pressure(mmHg): 148/75 Body Mass Index(BMI): 31.9 Temperature(F): 98.1 Respiratory Rate(breaths/min): 18 [2:Photos:]  [N/A:N/A] ZAVIEN, CLUBB ([id number]) [2:Right Gluteus Wound Location: Gradually Appeared Wounding Event: Abscess Primary Etiology: Chronic Obstructive Pulmonary Comorbid History: Disease (COPD), Arrhythmia, Coronary Artery Disease, Hypertension, Type II Diabetes 08/29/2022 Date Acquired: 5  Weeks of Treatment: Open Wound Status: No Wound Recurrence: 2x2.3x0.3 Measurements L x W x D (cm) 3.613 A (cm) : rea 1.084 Volume (cm) : 47.40% % Reduction in Area: 47.40% % Reduction in Volume: Full Thickness Without Exposed Classification: Support  Structures Medium Exudate Amount: Serosanguineous  Exudate Type: red, brown Exudate Color: Large (67-100%) Granulation Amount: Red Granulation Quality: None Present (0%) Necrotic Amount: Fat Layer (Subcutaneous Tissue): Yes N/A Exposed Structures: Fascia:  No Tendon: No Muscle: No Joint: No Bone: No None Epithelialization:] [N/A:N/A N/A N/A N/A N/A N/A N/A N/A N/A N/A N/A N/A N/A N/A N/A N/A N/A N/A N/A N/A N/A] Treatment Notes Electronic Signature(s) Signed: 10/10/2022 11:25:56 AM By: Yevonne Pax RN Entered By: Yevonne Pax on 10/07/2022 09:33:51 -------------------------------------------------------------------------------- Multi-Disciplinary Care Plan Details Patient Name: Date of Service: Larry Callahan MES F. 10/07/2022 9:45 A M Medical Record Number: [id number] Patient Account Number: [id number] Date of Birth/Sex: Treating RN: 01-25-1956 (67 y.o. Larry Callahan) Yevonne Pax Primary Care Joselynn Amoroso: Sallyanne Kuster Other Clinician: Referring Aliyana Dlugosz: Treating Lenni Reckner/Extender: Weldon Inches Weeks in Treatment: 7 Active Inactive Necrotic Tissue Nursing Diagnoses: Knowledge deficit related to management of necrotic/devitalized tissue Goals: Necrotic/devitalized tissue will be minimized in the wound bed Date Initiated: 09/01/2022 Target Resolution Date: 11/01/2022 Goal Status: Active Interventions: Assess patient pain level pre-, during and post  procedure and prior to discharge Provide education on necrotic tissue and debridement process Notes: Electronic Signature(s) Signed: 10/10/2022 11:25:56 AM By: Yevonne Pax RN Entered By: Yevonne Pax on 10/07/2022 09:34:11 [place] ([id number]) [id number] Page 5 of 7 -------------------------------------------------------------------------------- Pain Assessment Details Patient Name: Date of Service: Larry Callahan MES F. 10/07/2022 9:45 A M Medical Record Number: [id number] Patient Account Number: [id number] Date of Birth/Sex: Treating RN: 09-06-1955 (67 y.o. Larry Callahan) Yevonne Pax Primary Care Shyam Dawson: Sallyanne Kuster Other Clinician: Referring Leisha Trinkle: Treating Jarae Nemmers/Extender: Weldon Inches Weeks in Treatment: 7 Active Problems Location of Pain Severity and Description of Pain Patient Has Paino No Site Locations Pain Management and Medication Current Pain Management: Electronic Signature(s) Signed: 10/10/2022 11:25:56 AM By: Yevonne Pax RN Entered By: Yevonne Pax on 10/07/2022 09:30:57 -------------------------------------------------------------------------------- Patient/Caregiver Education Details Patient Name: Date of Service: Larry Callahan MES F. 4/26/2024andnbsp9:45 A M Medical Record Number: [id number] Patient Account Number: [id number] Date of Birth/Gender: Treating RN: Dec 01, 1955 (67 y.o. Melonie [place] Primary Care Physician: Sallyanne Kuster Other Clinician: Referring Physician: Treating Physician/Extender: Weldon Inches Weeks in Treatment: 7 Education Assessment Education Provided To: Patient Education Topics Provided Wound Debridement: Handouts: Wound Debridement Methods: Explain/Verbal Responses: State content correctly Electronic Signature(s) Signed: 10/10/2022 11:25:56 AM By: Yevonne Pax RN Entered By: Yevonne Pax on 10/07/2022 09:34:23 [place] ([id number])  [id number] Page 6 of 7 -------------------------------------------------------------------------------- Wound Assessment Details Patient Name: Date of Service: Larry Callahan MES F. 10/07/2022 9:45 A M Medical Record Number: [id number] Patient Account Number: [id number] Date of Birth/Sex: Treating RN: 1956-05-21 (67 y.o. Larry Callahan) Yevonne Pax Primary Care Jaysten Essner: Sallyanne Kuster Other Clinician: Referring Trellis Guirguis: Treating Elijahjames Fuelling/Extender: Weldon Inches Weeks in Treatment: 7 Wound Status Wound Number: 2 Primary Abscess Etiology: Wound Location: Right Gluteus Wound Open Wounding Event: Gradually Appeared Status: Date Acquired: 08/29/2022 Comorbid Chronic Obstructive Pulmonary Disease (COPD), Arrhythmia, Weeks Of Treatment: 5 History: Coronary Artery Disease, Hypertension, Type II Diabetes Clustered Wound: No Photos Wound Measurements Length: (cm) 2 Width: (cm) 2.3 Depth: (cm) 0.3 Area: (cm) 3.613 Volume: (cm) 1.084 % Reduction in Area: 47.4% % Reduction in Volume: 47.4% Epithelialization: None Tunneling: No Undermining: No Wound Description Classification: Full Thickness Without Exposed Support Structures Exudate Amount: Medium Exudate Type: Serosanguineous Exudate Color: red, brown Foul Odor After Cleansing: No Slough/Fibrino No Wound Bed Granulation Amount: Large (67-100%) Exposed Structure Granulation Quality: Red Fascia Exposed: No Necrotic Amount: None Present (0%) Fat Layer (Subcutaneous  Tissue) Exposed: Yes Tendon Exposed: No Muscle Exposed: No Joint Exposed: No Bone Exposed: No Treatment Notes Wound #2 (Gluteus) Wound Laterality: Right Cleanser Soap and Water Discharge Instruction: Gently cleanse wound with antibacterial soap, rinse and pat dry prior to dressing wounds Peri-Wound Care Topical Primary Dressing Hydrofera Blue Ready Transfer Foam, 2.5x2.5 (in/in) Discharge Instruction: Apply Hydrofera Blue  Ready to wound bed as directed Secondary Dressing T Adhesive [employer], 4x4 (in/in) elfa Discharge Instruction: Apply over dressing to secure in place. Secured With KAYCEN, WHITWORTH ([id number]) [id number] Page 7 of 7 Compression Wrap Compression Stockings Add-Ons Electronic Signature(s) Signed: 10/10/2022 11:25:56 AM By: Yevonne Pax RN Entered By: Yevonne Pax on 10/07/2022 09:33:37 -------------------------------------------------------------------------------- Vitals Details Patient Name: Date of Service: Tracie Harrier, Fawn Kirk MES F. 10/07/2022 9:45 A M Medical Record Number: [id number] Patient Account Number: [id number] Date of Birth/Sex: Treating RN: 1955-07-02 (67 y.o. Larry Callahan) Yevonne Pax Primary Care Liberato Stansbery: Sallyanne Kuster Other Clinician: Referring Triton Heidrich: Treating Rozalyn Osland/Extender: Weldon Inches Weeks in Treatment: 7 Vital Signs Time Taken: 09:30 Temperature (F): 98.1 Height (in): 71 Pulse (bpm): 97 Weight (lbs): 229 Respiratory Rate (breaths/min): 18 Body Mass Index (BMI): 31.9 Blood Pressure (mmHg): 148/75 Reference Range: 80 - 120 mg / dl Electronic Signature(s) Signed: 10/10/2022 11:25:56 AM By: Yevonne Pax RN Entered By: Yevonne Pax on 10/07/2022 09:30:51

## 2022-10-10 NOTE — Patient Outreach (Signed)
  Care Coordination   10/10/2022 Name: Larry Callahan MRN: [id number] DOB: Jul 16, 1955   Care Coordination Outreach Attempts:  An unsuccessful telephone outreach was attempted for a scheduled appointment today. HIPAA compliant voice message left with call back phone number and return call request.   Follow Up Plan:  Additional outreach attempts will be made to offer the patient care coordination information and services.   Encounter Outcome:  No Answer   Care Coordination Interventions:  No, not indicated    George Ina [facility] Care Coordination [phone] direct line

## 2022-10-12 DIAGNOSIS — J449 Chronic obstructive pulmonary disease, unspecified: Secondary | ICD-10-CM | POA: Diagnosis not present

## 2022-10-12 DIAGNOSIS — E119 Type 2 diabetes mellitus without complications: Secondary | ICD-10-CM | POA: Diagnosis not present

## 2022-10-12 DIAGNOSIS — I110 Hypertensive heart disease with heart failure: Secondary | ICD-10-CM | POA: Diagnosis not present

## 2022-10-12 DIAGNOSIS — Z602 Problems related to living alone: Secondary | ICD-10-CM | POA: Diagnosis not present

## 2022-10-12 DIAGNOSIS — G4733 Obstructive sleep apnea (adult) (pediatric): Secondary | ICD-10-CM | POA: Diagnosis not present

## 2022-10-12 DIAGNOSIS — I509 Heart failure, unspecified: Secondary | ICD-10-CM | POA: Diagnosis not present

## 2022-10-12 DIAGNOSIS — I2510 Atherosclerotic heart disease of native coronary artery without angina pectoris: Secondary | ICD-10-CM | POA: Diagnosis not present

## 2022-10-12 DIAGNOSIS — E785 Hyperlipidemia, unspecified: Secondary | ICD-10-CM | POA: Diagnosis not present

## 2022-10-12 DIAGNOSIS — L0231 Cutaneous abscess of buttock: Secondary | ICD-10-CM | POA: Diagnosis not present

## 2022-10-12 IMAGING — US US EXTREM UP *R* LTD
1 series · 14 of 25 positions shown · non-contrast
Comparison: None.

CLINICAL DATA: Posterior right upper extremity swelling and redness

EXAM:
ULTRASOUND RIGHT UPPER EXTREMITY LIMITED
TECHNIQUE: Ultrasound examination of the upper extremity soft tissues was
performed in the area of clinical concern.

[Series 1: us soft tissue right upper extremity limited (non- · 14 of 70 slices shown]
[im 1/70]
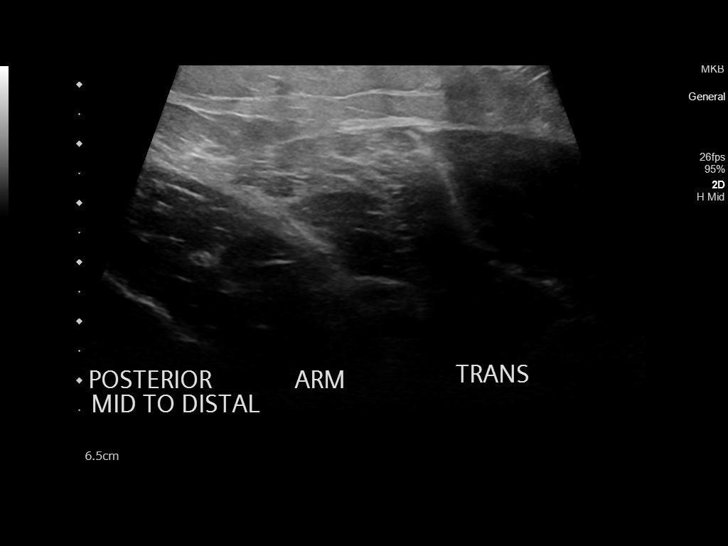
[im 6/70]
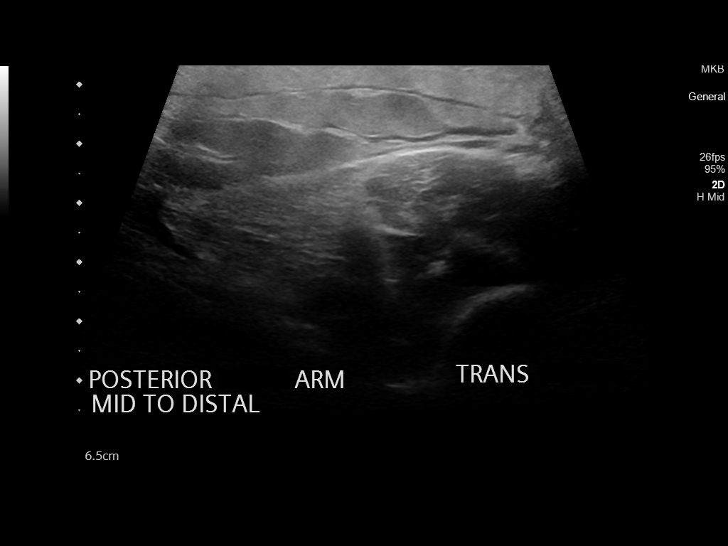
[im 12/70]
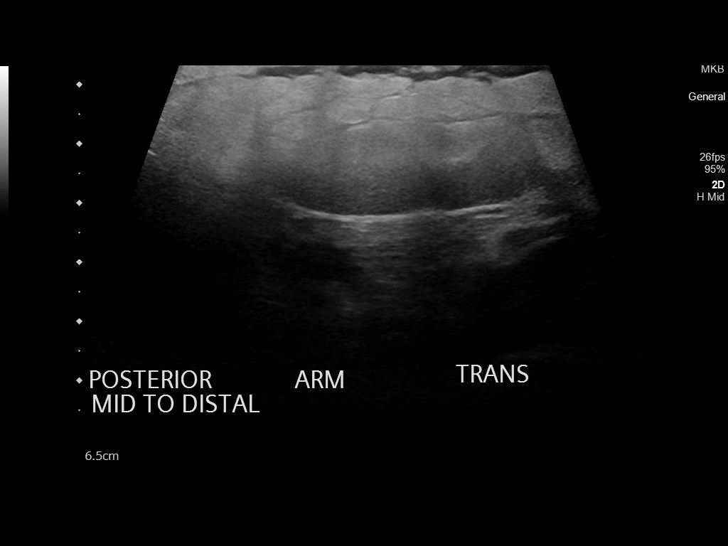
[im 18/70]
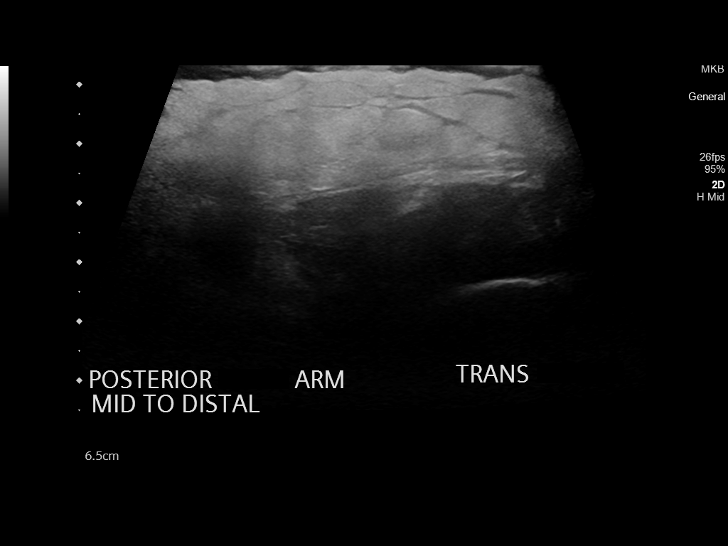
[im 24/70]
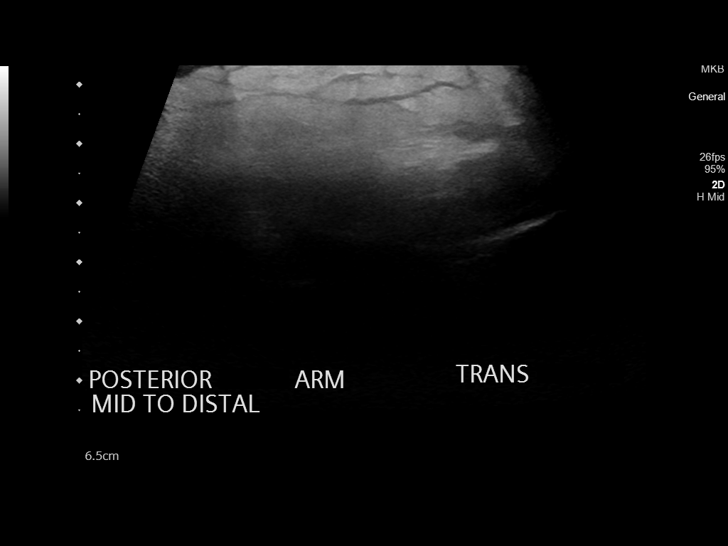
[im 26/70]
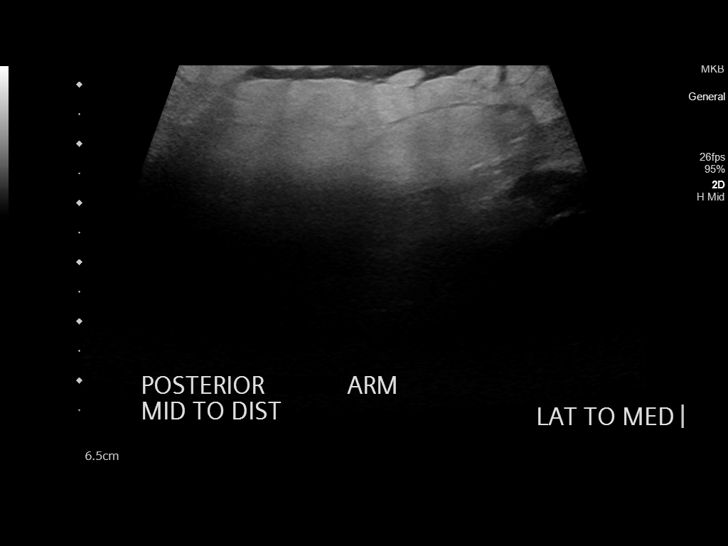
[im 32/70]
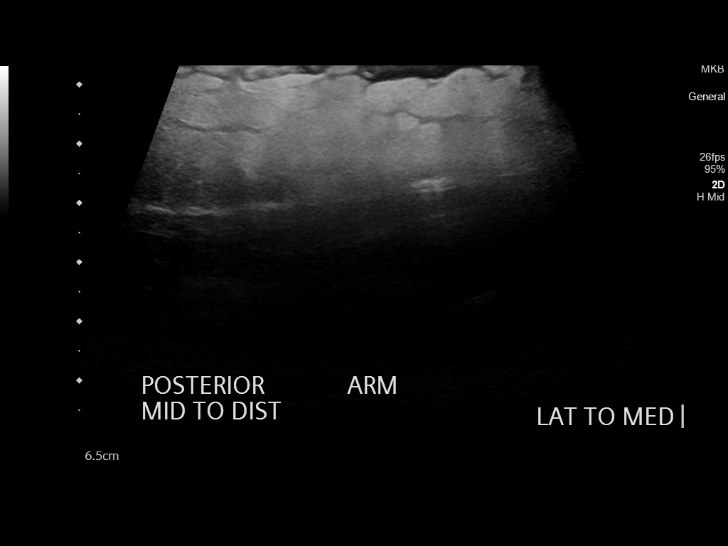
[im 38/70]
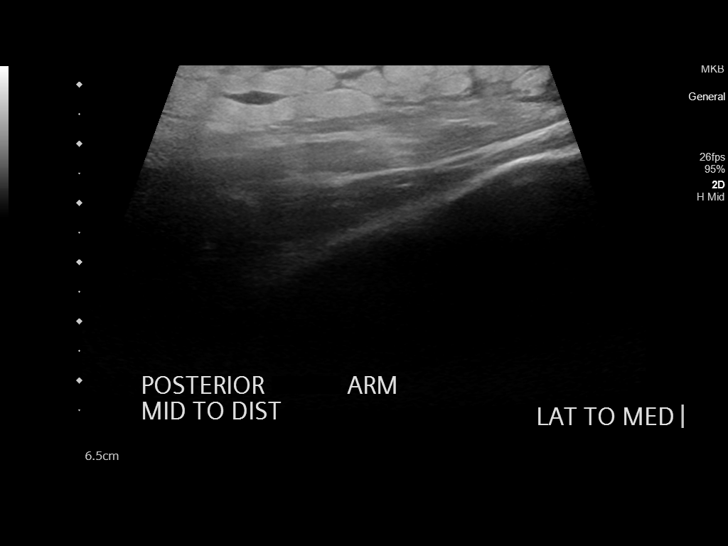
[im 44/70]
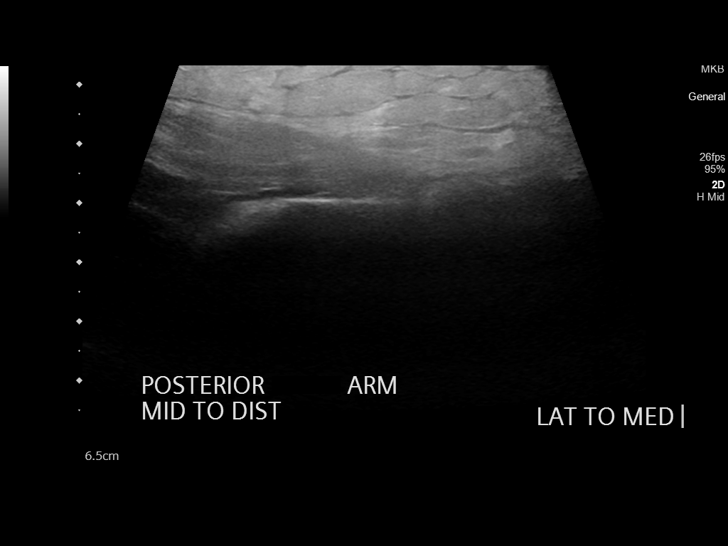
[im 47/70]
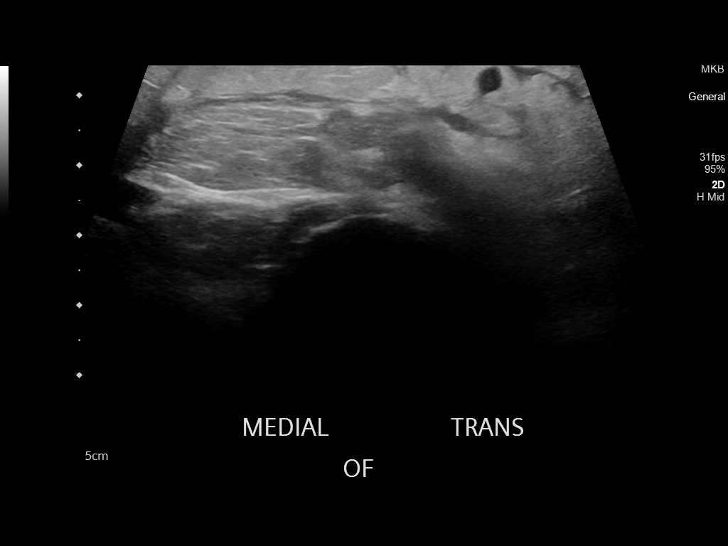
[im 52/70]
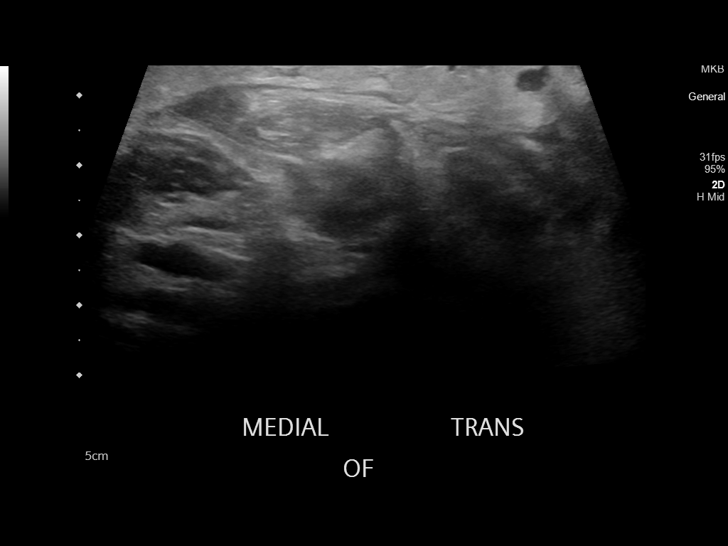
[im 58/70]
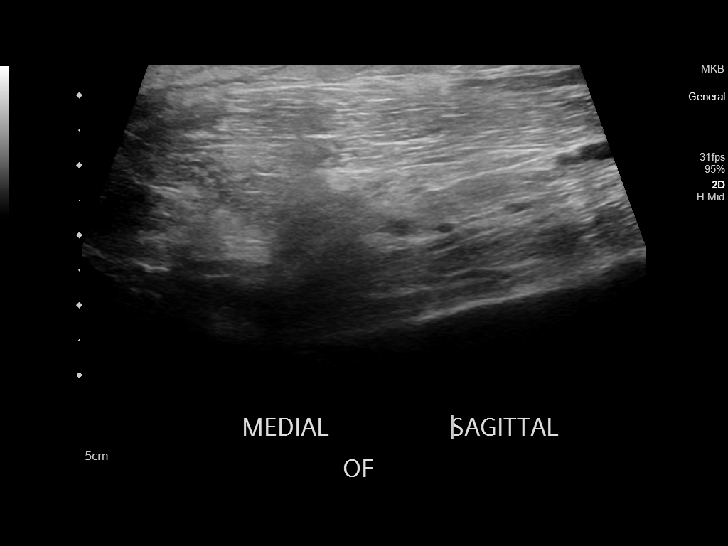
[im 64/70]
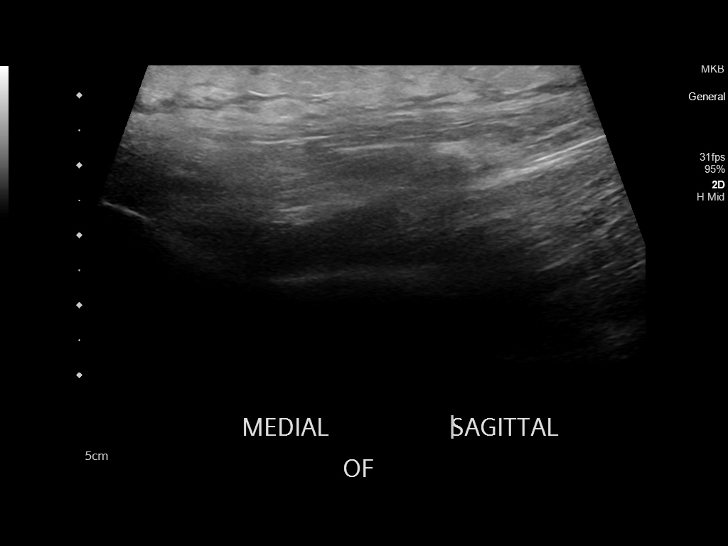
[im 70/70]
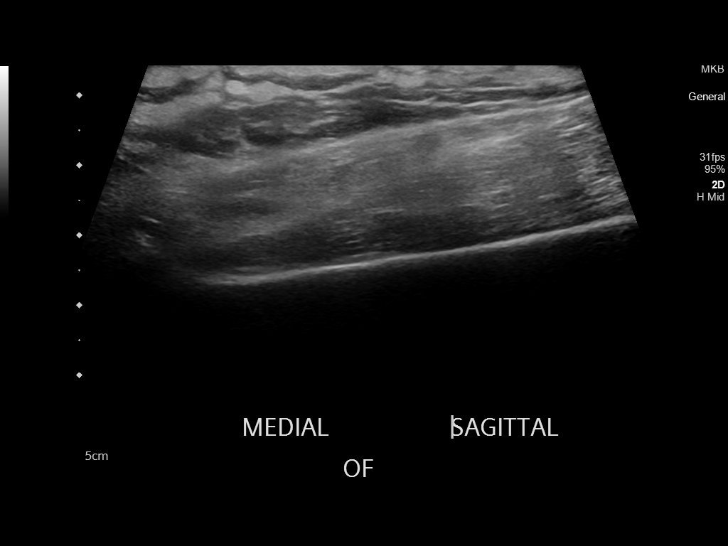

[14 of 25 positions shown; findings below may reference images not displayed]

FINDINGS: Targeted ultrasound was performed of the mid to distal aspect of the
posterior right upper arm and medial right forearm at site of
patient's clinical concern. Subcutaneous edema and ill-defined fluid
is present throughout the superficial soft tissues at these
locations. No organized or drainable fluid collections.
IMPRESSION: Findings suggestive of cellulitis of the right upper extremity. No
organized or drainable fluid collections.

## 2022-10-13 IMAGING — CR DG ELBOW COMPLETE 3+V*R*
5 series · 5 of 5 positions shown · non-contrast
Comparison: None.

CLINICAL DATA: Elbow cellulitis

EXAM:
RIGHT ELBOW - COMPLETE 3+ VIEW

[elbow ap]
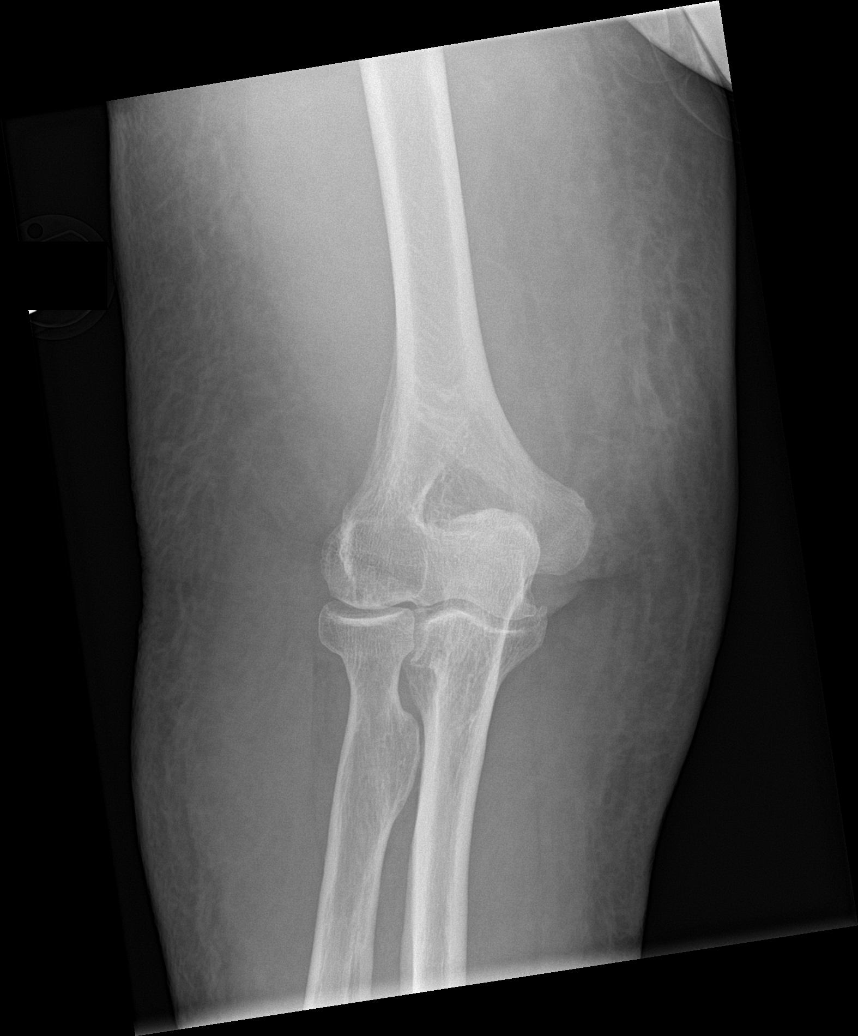

[elbow obl (1 of 2)]
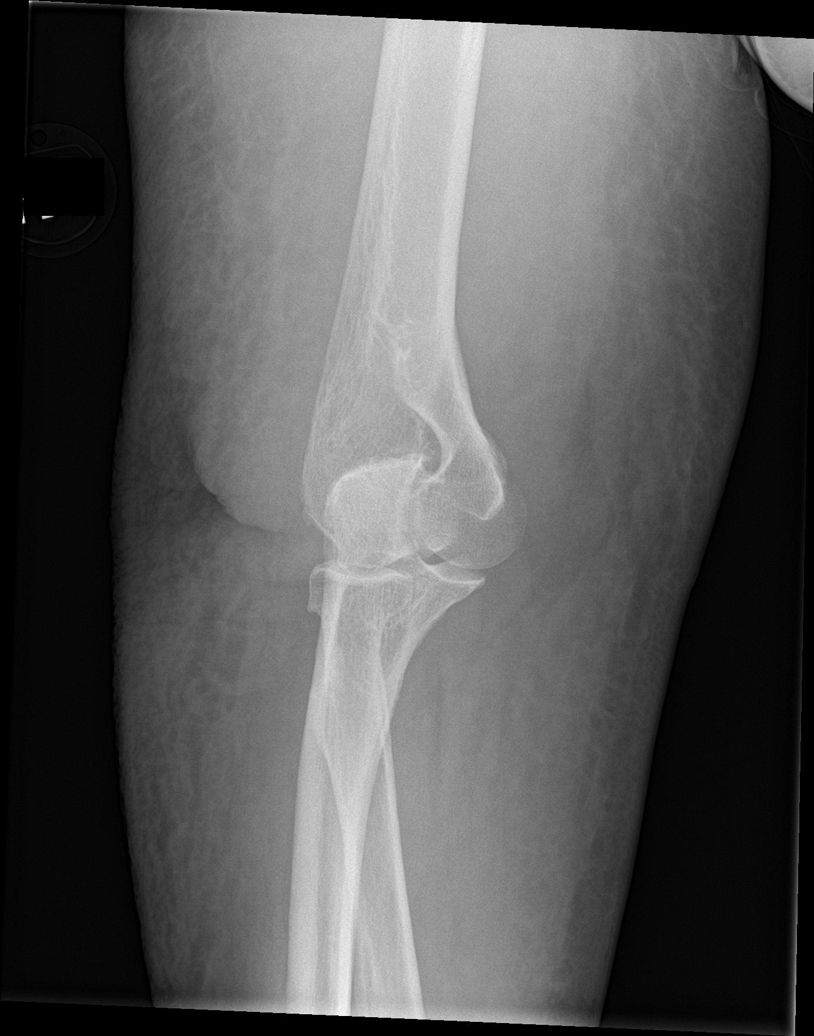

[elbow obl (2 of 2)]
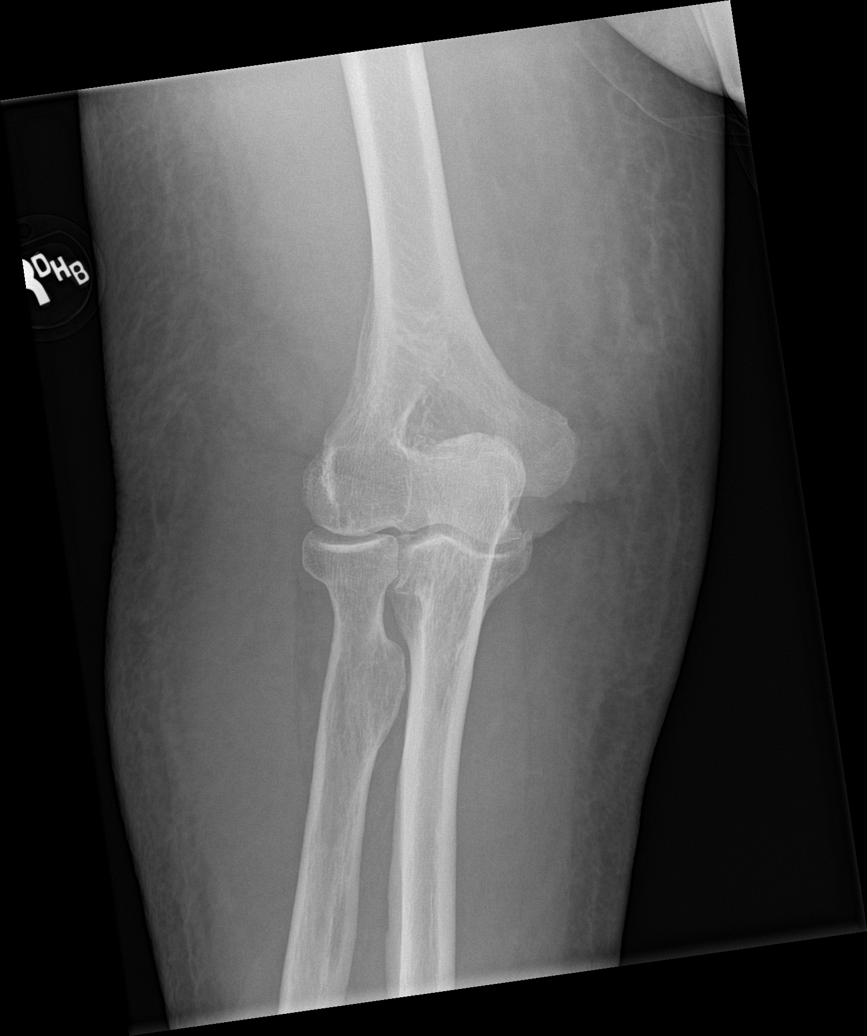

[elbow lat (1 of 2)]
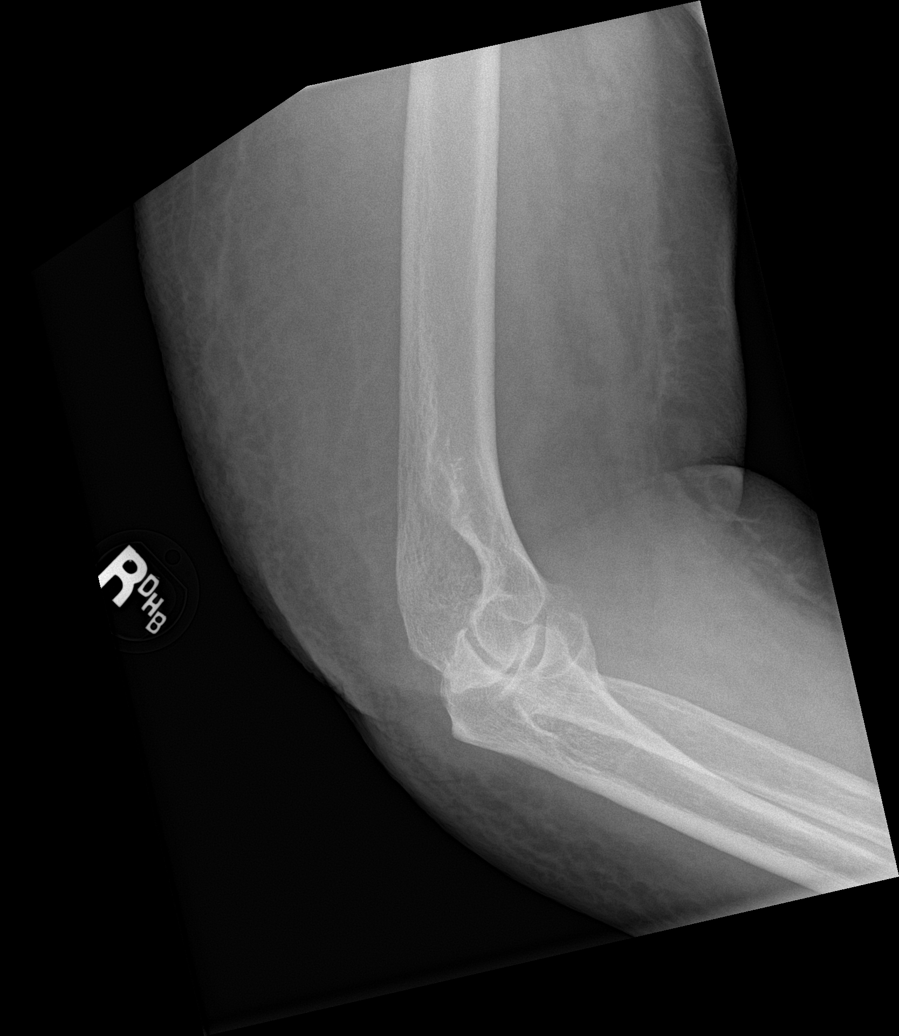

[elbow lat (2 of 2)]
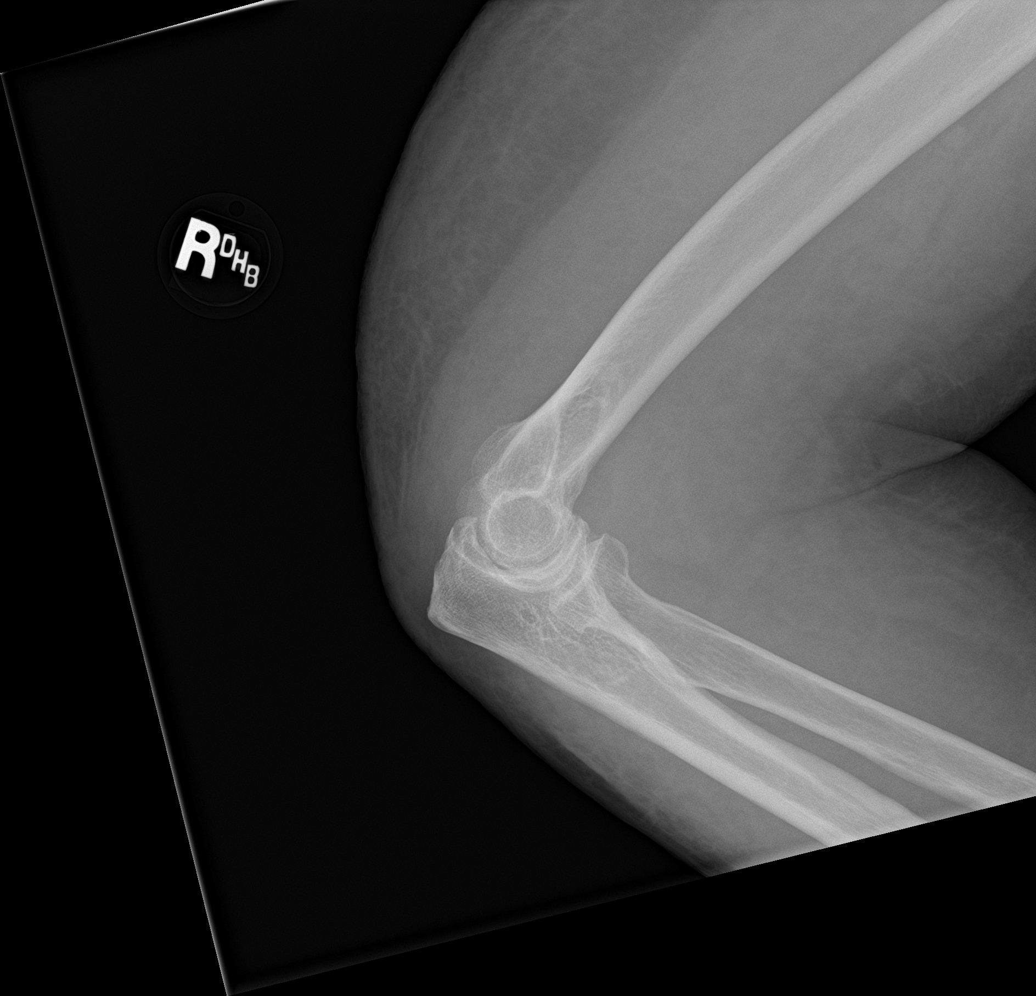

[5 of 5 positions shown; findings below may reference images not displayed]

FINDINGS: No acute fracture or dislocation is noted. Minimal elevation of the
anterior fat pad is noted which may be related to a small joint
effusion. Considerable soft tissue swelling is noted particularly
posteriorly and along the medial and lateral aspects of the elbow
consistent with the known history of cellulitis.
IMPRESSION: Changes consistent with cellulitis. No acute bony abnormality is
noted.

## 2022-10-13 IMAGING — CR DG CHEST 1V PORT
1 series · 1 of 1 positions shown · non-contrast
Comparison: 02/18/2021

CLINICAL DATA: Chest pain

EXAM:
PORTABLE CHEST 1 VIEW

[chest ap]
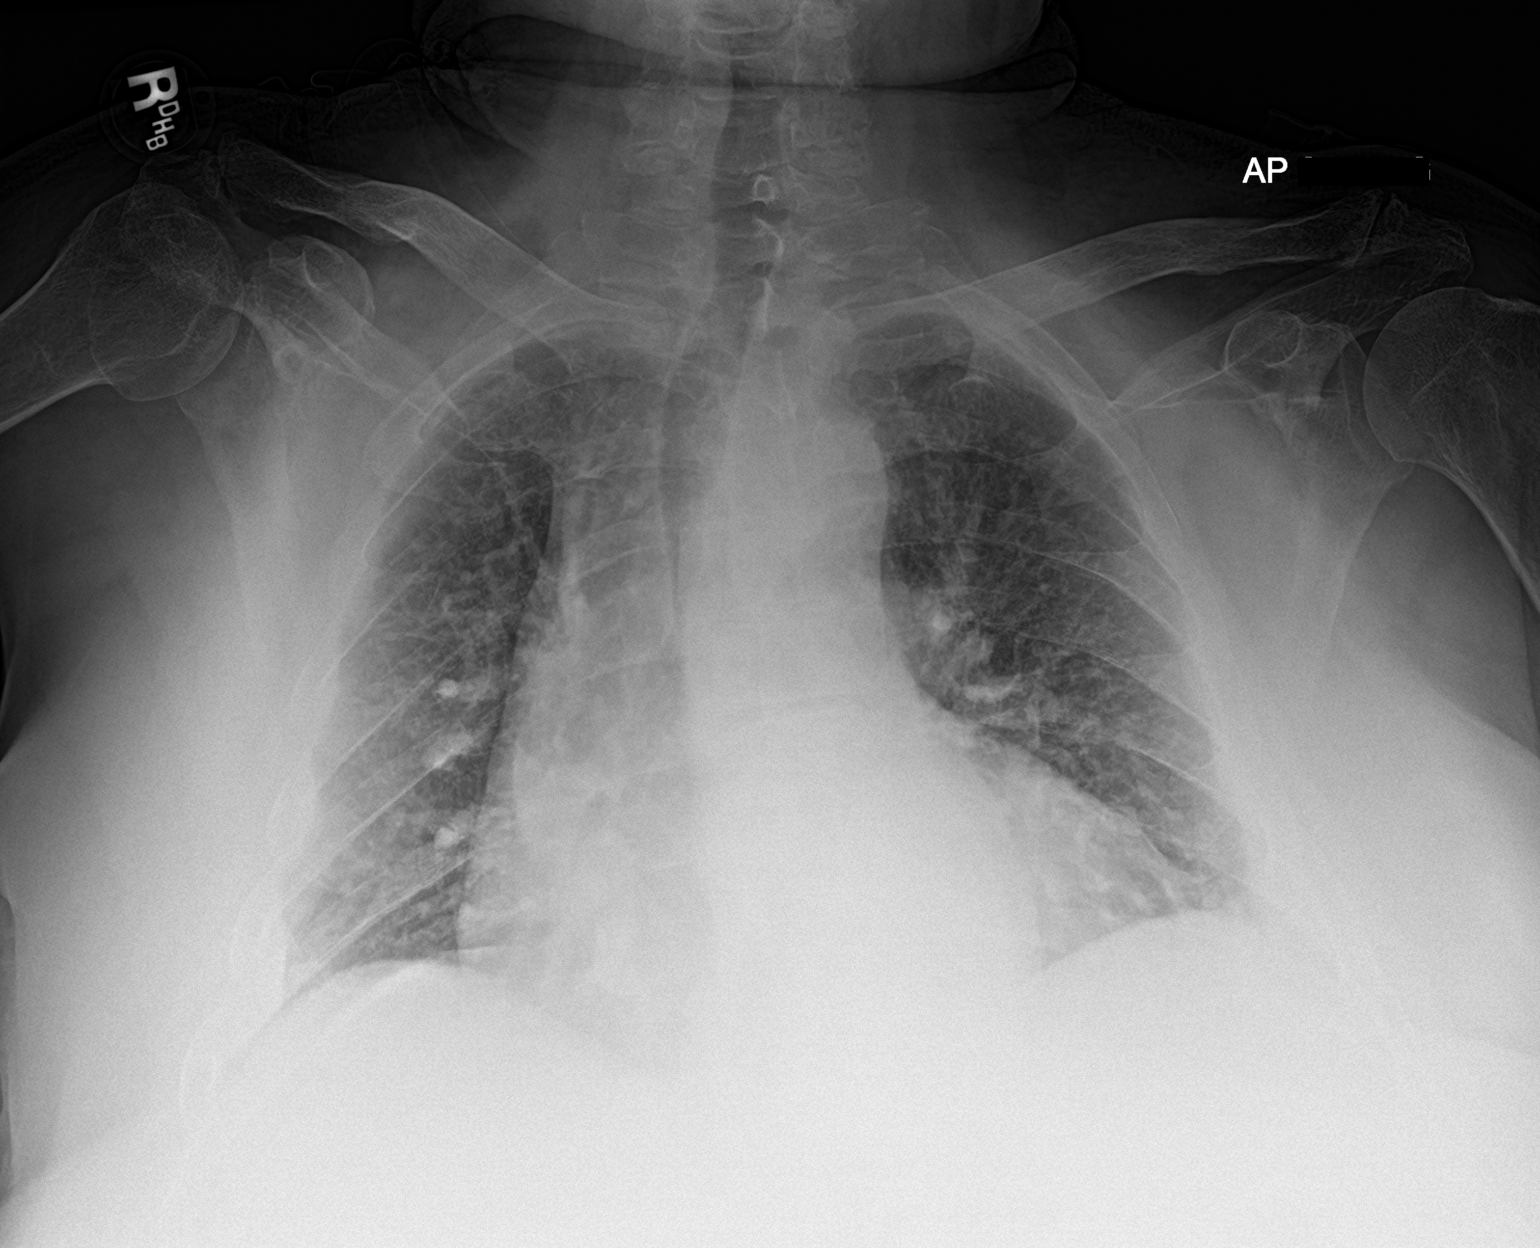

[1 of 1 positions shown; findings below may reference images not displayed]

FINDINGS: Cardiac shadow remains enlarged. Lungs are well aerated bilaterally.
Mild vascular congestion is seen without focal infiltrate. No bony
abnormality is noted.
IMPRESSION: Mild vascular congestion without focal infiltrate.

## 2022-10-14 ENCOUNTER — Telehealth: Payer: Self-pay | Admitting: *Deleted

## 2022-10-14 ENCOUNTER — Ambulatory Visit: Payer: Medicare PPO | Admitting: Physician Assistant

## 2022-10-14 DIAGNOSIS — G4733 Obstructive sleep apnea (adult) (pediatric): Secondary | ICD-10-CM | POA: Diagnosis not present

## 2022-10-14 DIAGNOSIS — I110 Hypertensive heart disease with heart failure: Secondary | ICD-10-CM | POA: Diagnosis not present

## 2022-10-14 DIAGNOSIS — E785 Hyperlipidemia, unspecified: Secondary | ICD-10-CM | POA: Diagnosis not present

## 2022-10-14 DIAGNOSIS — J449 Chronic obstructive pulmonary disease, unspecified: Secondary | ICD-10-CM | POA: Diagnosis not present

## 2022-10-14 DIAGNOSIS — Z602 Problems related to living alone: Secondary | ICD-10-CM | POA: Diagnosis not present

## 2022-10-14 DIAGNOSIS — E119 Type 2 diabetes mellitus without complications: Secondary | ICD-10-CM | POA: Diagnosis not present

## 2022-10-14 DIAGNOSIS — I509 Heart failure, unspecified: Secondary | ICD-10-CM | POA: Diagnosis not present

## 2022-10-14 DIAGNOSIS — I2510 Atherosclerotic heart disease of native coronary artery without angina pectoris: Secondary | ICD-10-CM | POA: Diagnosis not present

## 2022-10-14 DIAGNOSIS — L0231 Cutaneous abscess of buttock: Secondary | ICD-10-CM | POA: Diagnosis not present

## 2022-10-14 NOTE — Progress Notes (Unsigned)
  Care Coordination Note  10/14/2022 Name: Larry Callahan MRN: [id number] DOB: Jun 12, 1956  Larry Callahan is a 67 y.o. year old male who is a primary care patient of Sallyanne Kuster, NP and is actively engaged with the care management team. I reached out to [name] by phone today to assist with re-scheduling an initial visit with the RN Case Manager  Follow up plan: Unsuccessful telephone outreach attempt made. A HIPAA compliant phone message was left for the patient providing contact information and requesting a return call.   [name], CCMA Care Coordination Care Guide Direct Dial: [phone]

## 2022-10-17 ENCOUNTER — Encounter: Payer: Medicare PPO | Attending: Physician Assistant | Admitting: Physician Assistant

## 2022-10-17 DIAGNOSIS — J449 Chronic obstructive pulmonary disease, unspecified: Secondary | ICD-10-CM | POA: Diagnosis not present

## 2022-10-17 DIAGNOSIS — I10 Essential (primary) hypertension: Secondary | ICD-10-CM | POA: Insufficient documentation

## 2022-10-17 DIAGNOSIS — L0231 Cutaneous abscess of buttock: Secondary | ICD-10-CM | POA: Insufficient documentation

## 2022-10-17 DIAGNOSIS — E119 Type 2 diabetes mellitus without complications: Secondary | ICD-10-CM | POA: Diagnosis not present

## 2022-10-17 DIAGNOSIS — I2510 Atherosclerotic heart disease of native coronary artery without angina pectoris: Secondary | ICD-10-CM | POA: Diagnosis not present

## 2022-10-17 NOTE — Progress Notes (Signed)
RODOLFO, SHERTZER ([id number]) [id number] Page 1 of 5 Visit Report for 10/17/2022 Chief Complaint Document Details Patient Name: Date of Service: Wynelle Link MES F. 10/17/2022 11:00 A M Medical Record Number: [id number] Patient Account Number: [id number] Date of Birth/Sex: Treating RN: 03-Sep-1955 (67 y.o. [name]) Yevonne Pax Primary Care Provider: Sallyanne Kuster Other Clinician: Referring Provider: Treating Provider/Extender: Weldon Inches Weeks in Treatment: 9 Information Obtained from: Patient Chief Complaint Right gluteal abscess Electronic Signature(s) Signed: 10/17/2022 11:10:35 AM By: Allen Derry PA-C Entered By: Allen Derry on 10/17/2022 11:10:35 -------------------------------------------------------------------------------- HPI Details Patient Name: Date of Service: Wynelle Link MES F. 10/17/2022 11:00 A M Medical Record Number: [id number] Patient Account Number: [id number] Date of Birth/Sex: Treating RN: August 09, 1955 (67 y.o. Melonie [place] Primary Care Provider: Sallyanne Kuster Other Clinician: Referring Provider: Treating Provider/Extender: Weldon Inches Weeks in Treatment: 9 History of Present Illness HPI Description: ADMISSION 08/15/2022 This is a 67 year old man who came in with very little medical information. There was some suggestion that he was in the hospital last month with cellulitis of the arm I will need to check this out thoroughly. He tells me that he received 2 courses of antibiotics last month were not really sure which ones they were but they are completed now. He is not really having any discomfort. His sister who lives nearby is dressing this with what sounds like Coban. I looked through [facility] health link. He was in the hospital about 3 weeks ago for an infection of the right elbow. Prior to that he had been in an urgent care receiving Keflex. I do not think he was admitted to hospital but may have spent  some time in the ER. I do not see any cultures. 3/11; patient presents for follow-up. He has been using Hydrofera Blue to the right elbow. The wound has healed. Readmission: 09-01-2022 upon evaluation today patient presents for reevaluation here in the clinic he actually was previously seen for his elbow today he comes in with the elbow okay but unfortunately a right gluteal abscess. Unfortunately he tells me that this is somewhat painful. He was seen in the ER they gave him doxycycline he has not picked that up yet that was just yesterday. 09-08-2022 upon evaluation today patient's wound actually appears to be excellent. In fact he called in stating that was bleeding profusely have a note in communications concerning that. Nonetheless the home health nurse and reassured me he was doing okay and this appears to be doing just fine today as well. I think it is actually showing less pressure compared to last time I saw him as far as the overall surrounding tissue and how things appear. 09-15-2022 upon evaluation today patient's wound unfortunately appears to be infected. I do believe that he is going to require intervention as far as antibiotics are concerned I am actually going to grab a wound culture today as well. 09-22-2022 upon evaluation today patient appears to be doing better in regard to his wound and actually very pleased with where things stand. Fortunately I do think that he is moving in the right direction. No fevers, chills, nausea, vomiting, or diarrhea. 09-30-2022 upon evaluation today patient appears to be doing well currently in regard to his wound. This is actually showing signs of improvement which is great news. I am very pleased with where we stand. I do not see any evidence of active infection locally nor systemically which is great news. The patient in general tells me that  he is still having some discomfort but this is not nearly as much as it was in the beginning. Overall I do believe  that we are moving in the right direction. 10-07-2022 upon evaluation today patient appears to be doing well currently in regard to his wound which is showing signs of improvement. Fortunately I do not see any evidence of active infection locally nor systemically which is great news. No fevers, chills, nausea, vomiting, or diarrhea. 10-17-2022 upon evaluation today patient appears to be doing well currently in regard to his wound though he continues to have fecal material in and around the dressing and wound. This is not good for the healing and may in fact be slowing things down quite a bit. Electronic Signature(s) KOBAIN, LAMIRANDE ([id number]) [id number] Page 2 of 5 Signed: 10/17/2022 6:02:11 PM By: Allen Derry PA-C Entered By: Allen Derry on 10/17/2022 18:02:11 -------------------------------------------------------------------------------- Physical Exam Details Patient Name: Date of Service: Wynelle Link MES F. 10/17/2022 11:00 A M Medical Record Number: [id number] Patient Account Number: [id number] Date of Birth/Sex: Treating RN: Mar 31, 1956 (67 y.o. [name]) Yevonne Pax Primary Care Provider: Sallyanne Kuster Other Clinician: Referring Provider: Treating Provider/Extender: Weldon Inches Weeks in Treatment: 9 Constitutional Well-nourished and well-hydrated in no acute distress. Respiratory normal breathing without difficulty. Psychiatric this patient is able to make decisions and demonstrates good insight into disease process. Alert and Oriented x 3. pleasant and cooperative. Notes Upon inspection patient's wound bed looked like it was showing some signs of improvement which is great news fortunately however I do not see any evidence of active infection locally nor systemically which is excellent as well. Electronic Signature(s) Signed: 10/17/2022 6:02:26 PM By: Allen Derry PA-C Entered By: Allen Derry on 10/17/2022  18:02:26 -------------------------------------------------------------------------------- Physician Orders Details Patient Name: Date of Service: Wynelle Link MES F. 10/17/2022 11:00 A M Medical Record Number: [id number] Patient Account Number: [id number] Date of Birth/Sex: Treating RN: [birth year] (67 y.o. [name]) Yevonne Pax Primary Care Provider: Sallyanne Kuster Other Clinician: Referring Provider: Treating Provider/Extender: Weldon Inches Weeks in Treatment: 9 Verbal / Phone Orders: No Diagnosis Coding ICD-10 Coding Code Description L02.31 Cutaneous abscess of buttock Follow-up Appointments Return Appointment in 1 week. Home Health [facility] for wound care. May utilize formulary equivalent dressing for wound treatment orders unless otherwise specified. Home Health Nurse may visit PRN to address patients wound care needs. Rolene Arbour [phone] Anesthetic (Use 'Patient Medications' Section for Anesthetic Order Entry) Lidocaine applied to wound bed Edema Control - Lymphedema / Segmental Compressive Device / Other Elevate, Exercise Daily and A void Standing for Long Periods of Time. Elevate legs to the level of the heart and pump ankles as often as possible Elevate leg(s) parallel to the floor when sitting. Wound Treatment Wound #2 - Gluteus Wound Laterality: Right Cleanser: Soap and Water 3 x Per Week/15 Days Discharge Instructions: Gently cleanse wound with antibacterial soap, rinse and pat dry prior to dressing wounds Prim Dressing: Hydrofera Blue Ready Transfer Foam, 2.5x2.5 (in/in) (Generic) 3 x Per Week/15 Days ary Discharge Instructions: Apply Hydrofera Blue Ready to wound bed as directed TAYLOUR, VERONICA ([id number]) [id number] Page 3 of 5 Secondary Dressing: (BORDER) Zetuvit Plus SILICONE BORDER Dressing 4x4 (in/in) 3 x Per Week/15 Days Discharge Instructions: Please do not put silicone bordered dressings under wraps.  Use non-bordered dressing only. Electronic Signature(s) Signed: 10/17/2022 6:09:56 PM By: Allen Derry PA-C Signed: 10/19/2022 9:53:20 AM By: Yevonne Pax RN Entered By: Yevonne Pax on 10/17/2022 11:45:33 --------------------------------------------------------------------------------  Problem List Details Patient Name: Date of Service: Wynelle Link MES F. 10/17/2022 11:00 A M Medical Record Number: [id number] Patient Account Number: [id number] Date of Birth/Sex: Treating RN: [birth year] (67 y.o. [name]) Yevonne Pax Primary Care Provider: Sallyanne Kuster Other Clinician: Referring Provider: Treating Provider/Extender: Weldon Inches Weeks in Treatment: 9 Active Problems ICD-10 Encounter Code Description Active Date MDM Diagnosis L02.31 Cutaneous abscess of buttock 08/15/2022 No Yes Inactive Problems Resolved Problems Electronic Signature(s) Signed: 10/17/2022 11:10:31 AM By: Allen Derry PA-C Entered By: Allen Derry on 10/17/2022 11:10:31 -------------------------------------------------------------------------------- Progress Note Details Patient Name: Date of Service: Wynelle Link MES F. 10/17/2022 11:00 A M Medical Record Number: [id number] Patient Account Number: [id number] Date of Birth/Sex: Treating RN: 1955-11-14 (67 y.o. [name]) Yevonne Pax Primary Care Provider: Sallyanne Kuster Other Clinician: Referring Provider: Treating Provider/Extender: Weldon Inches Weeks in Treatment: 9 Subjective Chief Complaint Information obtained from Patient Right gluteal abscess History of Present Illness (HPI) ADMISSION 08/15/2022 This is a 67 year old man who came in with very little medical information. There was some suggestion that he was in the hospital last month with cellulitis of the arm I will need to check this out thoroughly. He tells me that he received 2 courses of antibiotics last month were not really sure which ones they were but they are completed now. He  is not really having any discomfort. His sister who lives nearby is dressing this with what sounds like Coban. I looked through [facility] health link. He was in the hospital about 3 weeks ago for an infection of the right elbow. Prior to that he had been in an urgent care receiving Keflex. I do not think he was admitted to hospital but may have spent some time in the ER. I do not see any cultures. 3/11; patient presents for follow-up. He has been using Hydrofera Blue to the right elbow. The wound has healed. Readmission: 09-01-2022 upon evaluation today patient presents for reevaluation here in the clinic he actually was previously seen for his elbow today he comes in with the elbow okay but unfortunately a right gluteal abscess. Unfortunately he tells me that this is somewhat painful. He was seen in the ER they gave him ROMNEY, FAHEY ([id number]) [id number] Page 4 of 5 doxycycline he has not picked that up yet that was just yesterday. 09-08-2022 upon evaluation today patient's wound actually appears to be excellent. In fact he called in stating that was bleeding profusely have a note in communications concerning that. Nonetheless the home health nurse and reassured me he was doing okay and this appears to be doing just fine today as well. I think it is actually showing less pressure compared to last time I saw him as far as the overall surrounding tissue and how things appear. 09-15-2022 upon evaluation today patient's wound unfortunately appears to be infected. I do believe that he is going to require intervention as far as antibiotics are concerned I am actually going to grab a wound culture today as well. 09-22-2022 upon evaluation today patient appears to be doing better in regard to his wound and actually very pleased with where things stand. Fortunately I do think that he is moving in the right direction. No fevers, chills, nausea, vomiting, or diarrhea. 09-30-2022 upon  evaluation today patient appears to be doing well currently in regard to his wound. This is actually showing signs of improvement which is great news. I am very pleased with where we stand. I do not see  any evidence of active infection locally nor systemically which is great news. The patient in general tells me that he is still having some discomfort but this is not nearly as much as it was in the beginning. Overall I do believe that we are moving in the right direction. 10-07-2022 upon evaluation today patient appears to be doing well currently in regard to his wound which is showing signs of improvement. Fortunately I do not see any evidence of active infection locally nor systemically which is great news. No fevers, chills, nausea, vomiting, or diarrhea. 10-17-2022 upon evaluation today patient appears to be doing well currently in regard to his wound though he continues to have fecal material in and around the dressing and wound. This is not good for the healing and may in fact be slowing things down quite a bit. Objective Constitutional Well-nourished and well-hydrated in no acute distress. Vitals Time Taken: 11:02 AM, Height: 71 in, Weight: 229 lbs, BMI: 31.9, Temperature: 97.5 F, Pulse: 99 bpm, Respiratory Rate: 18 breaths/min, Blood Pressure: 127/77 mmHg. Respiratory normal breathing without difficulty. Psychiatric this patient is able to make decisions and demonstrates good insight into disease process. Alert and Oriented x 3. pleasant and cooperative. General Notes: Upon inspection patient's wound bed looked like it was showing some signs of improvement which is great news fortunately however I do not see any evidence of active infection locally nor systemically which is excellent as well. Integumentary (Hair, Skin) Wound #2 status is Open. Original cause of wound was Gradually Appeared. The date acquired was: 08/29/2022. The wound has been in treatment 6 weeks. The wound is located on  the Right Gluteus. The wound measures 1.6cm length x 2.5cm width x 0.3cm depth; 3.142cm^2 area and 0.942cm^3 volume. There is Fat Layer (Subcutaneous Tissue) exposed. There is no tunneling or undermining noted. There is a medium amount of serosanguineous drainage noted. There is large (67-100%) red granulation within the wound bed. There is no necrotic tissue within the wound bed. Assessment Active Problems ICD-10 Cutaneous abscess of buttock Plan Follow-up Appointments: Return Appointment in 1 week. Home Health: [facility] for wound care. May utilize formulary equivalent dressing for wound treatment orders unless otherwise specified. Home Health Nurse may visit PRN to address patientoos wound care needs. Rolene Arbour [phone] Anesthetic (Use 'Patient Medications' Section for Anesthetic Order Entry): Lidocaine applied to wound bed Edema Control - Lymphedema / Segmental Compressive Device / Other: Elevate, Exercise Daily and Avoid Standing for Long Periods of Time. Elevate legs to the level of the heart and pump ankles as often as possible Elevate leg(s) parallel to the floor when sitting. WOUND #2: - Gluteus Wound Laterality: Right Cleanser: Soap and Water 3 x Per Week/15 Days Discharge Instructions: Gently cleanse wound with antibacterial soap, rinse and pat dry prior to dressing wounds Prim Dressing: Hydrofera Blue Ready Transfer Foam, 2.5x2.5 (in/in) (Generic) 3 x Per Week/15 Days DREAN, REMME ([id number]) [id number] Page 5 of 5 Discharge Instructions: Apply Hydrofera Blue Ready to wound bed as directed Secondary Dressing: (BORDER) Zetuvit Plus SILICONE BORDER Dressing 4x4 (in/in) 3 x Per Week/15 Days Discharge Instructions: Please do not put silicone bordered dressings under wraps. Use non-bordered dressing only. 1. I would recommend that we have the patient continue to monitor for any signs of infection or worsening. In general I do  believe that he is making good progress here. 2. Also can recommend that he should continue with the [facility] followed by the bordered foam dressing which  I think is still doing a good job. 3. I would also suggest that he should continue to monitor for any evidence of infection or worsening in general I think that we are moving in the right direction here. He needs to make sure to keep this area clean and free of fecal material. We will see patient back for reevaluation in 1 week here in the clinic. If anything worsens or changes patient will contact our office for additional recommendations. Electronic Signature(s) Signed: 10/17/2022 6:03:00 PM By: Allen Derry PA-C Entered By: Allen Derry on 10/17/2022 18:03:00 -------------------------------------------------------------------------------- SuperBill Details Patient Name: Date of Service: Tracie Harrier, JA MES F. 10/17/2022 Medical Record Number: [id number] Patient Account Number: [id number] Date of Birth/Sex: Treating RN: 1955-11-17 (67 y.o. [name]) Yevonne Pax Primary Care Provider: Sallyanne Kuster Other Clinician: Referring Provider: Treating Provider/Extender: Weldon Inches Weeks in Treatment: 9 Diagnosis Coding ICD-10 Codes Code Description L02.31 Cutaneous abscess of buttock Facility Procedures : CPT4 Code: [id number] Description: [id number] - WOUND CARE VISIT-LEV 2 EST PT Modifier: Quantity: 1 Physician Procedures : CPT4 Code Description Modifier [id number] - WC PHYS LEVEL 3 - EST PT ICD-10 Diagnosis Description L02.31 Cutaneous abscess of buttock Quantity: 1 Electronic Signature(s) Signed: 10/17/2022 6:03:37 PM By: Allen Derry PA-C Entered By: Allen Derry on 10/17/2022 18:03:37

## 2022-10-19 ENCOUNTER — Telehealth: Payer: Self-pay

## 2022-10-19 DIAGNOSIS — E119 Type 2 diabetes mellitus without complications: Secondary | ICD-10-CM | POA: Diagnosis not present

## 2022-10-19 DIAGNOSIS — I110 Hypertensive heart disease with heart failure: Secondary | ICD-10-CM | POA: Diagnosis not present

## 2022-10-19 DIAGNOSIS — L0231 Cutaneous abscess of buttock: Secondary | ICD-10-CM | POA: Diagnosis not present

## 2022-10-19 DIAGNOSIS — E785 Hyperlipidemia, unspecified: Secondary | ICD-10-CM | POA: Diagnosis not present

## 2022-10-19 DIAGNOSIS — I2510 Atherosclerotic heart disease of native coronary artery without angina pectoris: Secondary | ICD-10-CM | POA: Diagnosis not present

## 2022-10-19 DIAGNOSIS — Z602 Problems related to living alone: Secondary | ICD-10-CM | POA: Diagnosis not present

## 2022-10-19 DIAGNOSIS — G4733 Obstructive sleep apnea (adult) (pediatric): Secondary | ICD-10-CM | POA: Diagnosis not present

## 2022-10-19 DIAGNOSIS — J449 Chronic obstructive pulmonary disease, unspecified: Secondary | ICD-10-CM | POA: Diagnosis not present

## 2022-10-19 DIAGNOSIS — I509 Heart failure, unspecified: Secondary | ICD-10-CM | POA: Diagnosis not present

## 2022-10-19 NOTE — Patient Outreach (Signed)
  Care Coordination   10/19/2022 Name: Zamon November MRN: [id number] DOB: 04-05-1956   Care Coordination Outreach Attempts:  An unsuccessful telephone outreach was attempted today to offer the patient information about available care coordination services. HIPAA compliant voice message left with call back phone number and return call request.   Follow Up Plan:  Additional outreach attempts will be made to offer the patient care coordination information and services.   Encounter Outcome:  No Answer   Care Coordination Interventions:  No, not indicated    George Ina [facility] Care Coordination [phone] direct line

## 2022-10-19 NOTE — Progress Notes (Signed)
MAXIMILLIAN, SHINDLE ([id number]) [id number] Page 1 of 7 Visit Report for 10/17/2022 Arrival Information Details Patient Name: Date of Service: Larry Callahan MES F. 10/17/2022 11:00 A M Medical Record Number: [id number] Patient Account Number: [id number] Date of Birth/Sex: Treating RN: [birth year] (67 y.o. Larry Callahan) Yevonne Pax Primary Care Girolamo Lortie: Sallyanne Kuster Other Clinician: Referring Nettie Wyffels: Treating Lynnleigh Soden/Extender: Weldon Inches Weeks in Treatment: 9 Visit Information History Since Last Visit Added or deleted any medications: No Patient Arrived: Ambulatory Any new allergies or adverse reactions: No Arrival Time: 11:02 Had a fall or experienced change in No Accompanied By: self activities of daily living that may affect Transfer Assistance: None risk of falls: Patient Identification Verified: Yes Signs or symptoms of abuse/neglect since last visito No Secondary Verification Process Completed: Yes Hospitalized since last visit: No Patient Requires Transmission-Based Precautions: No Implantable device outside of the clinic excluding No Patient Has Alerts: Yes cellular tissue based products placed in the center Patient Alerts: Patient on Blood Thinner since last visit: Has Dressing in Place as Prescribed: Yes Pain Present Now: No Electronic Signature(s) Signed: 10/19/2022 9:53:20 AM By: Yevonne Pax RN Entered By: Yevonne Pax on 10/17/2022 11:02:36 -------------------------------------------------------------------------------- Clinic Level of Care Assessment Details Patient Name: Date of Service: Larry Callahan MES F. 10/17/2022 11:00 A M Medical Record Number: [id number] Patient Account Number: [id number] Date of Birth/Sex: Treating RN: 09/06/1955 (67 y.o. Larry Callahan) Yevonne Pax Primary Care Ryosuke Ericksen: Sallyanne Kuster Other Clinician: Referring Griff Badley: Treating Phenix Grein/Extender: Weldon Inches Weeks in Treatment: 9 Clinic  Level of Care Assessment Items TOOL 4 Quantity Score X- 1 0 Use when only an EandM is performed on FOLLOW-UP visit ASSESSMENTS - Nursing Assessment / Reassessment X- 1 10 Reassessment of Co-morbidities (includes updates in patient status) X- 1 5 Reassessment of Adherence to Treatment Plan ASSESSMENTS - Wound and Skin A ssessment / Reassessment X - Simple Wound Assessment / Reassessment - one wound 1 5 []  - 0 Complex Wound Assessment / Reassessment - multiple wounds []  - 0 Dermatologic / Skin Assessment (not related to wound area) ASSESSMENTS - Focused Assessment []  - 0 Circumferential Edema Measurements - multi extremities []  - 0 Nutritional Assessment / Counseling / Intervention []  - 0 Lower Extremity Assessment (monofilament, tuning fork, pulses) []  - 0 Peripheral Arterial Disease Assessment (using hand held doppler) ASSESSMENTS - Ostomy and/or Continence Assessment and Care []  - 0 Incontinence Assessment and Management []  - 0 Ostomy Care Assessment and Management (repouching, etc.) PROCESS - Coordination of Care X - Simple Patient / Family Education for ongoing care 1 [address], [place] ([id number]) [id number] Page 2 of 7 []  - 0 Complex (extensive) Patient / Family Education for ongoing care []  - 0 Staff obtains Chiropractor, Records, T Results / Process Orders est []  - 0 Staff telephones HHA, Nursing Homes / Clarify orders / etc []  - 0 Routine Transfer to another Facility (non-emergent condition) []  - 0 Routine Hospital Admission (non-emergent condition) []  - 0 New Admissions / Manufacturing engineer / Ordering NPWT Apligraf, etc. , []  - 0 Emergency Hospital Admission (emergent condition) X- 1 10 Simple Discharge Coordination []  - 0 Complex (extensive) Discharge Coordination PROCESS - Special Needs []  - 0 Pediatric / Minor Patient Management []  - 0 Isolation Patient Management []  - 0 Hearing / Language / Visual special needs []  -  0 Assessment of Community assistance (transportation, D/C planning, etc.) []  - 0 Additional assistance / Altered mentation []  - 0 Support Surface(s) Assessment (bed, cushion, seat, etc.) INTERVENTIONS -  Wound Cleansing / Measurement X - Simple Wound Cleansing - one wound 1 5 []  - 0 Complex Wound Cleansing - multiple wounds X- 1 5 Wound Imaging (photographs - any number of wounds) []  - 0 Wound Tracing (instead of photographs) X- 1 5 Simple Wound Measurement - one wound []  - 0 Complex Wound Measurement - multiple wounds INTERVENTIONS - Wound Dressings X - Small Wound Dressing one or multiple wounds 1 10 []  - 0 Medium Wound Dressing one or multiple wounds []  - 0 Large Wound Dressing one or multiple wounds []  - 0 Application of Medications - topical []  - 0 Application of Medications - injection INTERVENTIONS - Miscellaneous []  - 0 External ear exam []  - 0 Specimen Collection (cultures, biopsies, blood, body fluids, etc.) []  - 0 Specimen(s) / Culture(s) sent or taken to Lab for analysis []  - 0 Patient Transfer (multiple staff / Nurse, adult / Similar devices) []  - 0 Simple Staple / Suture removal (25 or less) []  - 0 Complex Staple / Suture removal (26 or more) []  - 0 Hypo / Hyperglycemic Management (close monitor of Blood Glucose) []  - 0 Ankle / Brachial Index (ABI) - do not check if billed separately X- 1 5 Vital Signs Has the patient been seen at the hospital within the last three years: Yes Total Score: 75 Level Of Care: New/Established - Level 2 Electronic Signature(s) Signed: 10/19/2022 9:53:20 AM By: Yevonne Pax RN Entered By: Yevonne Pax on 10/17/2022 11:46:12 [place] ([id number]) [id number] Page 3 of 7 -------------------------------------------------------------------------------- Encounter Discharge Information Details Patient Name: Date of Service: Larry Callahan MES F. 10/17/2022 11:00 A M Medical Record Number:  [id number] Patient Account Number: [id number] Date of Birth/Sex: Treating RN: 10/14/1955 (67 y.o. Larry Callahan) Yevonne Pax Primary Care Marja Adderley: Sallyanne Kuster Other Clinician: Referring Rhemi Balbach: Treating Lamiracle Chaidez/Extender: Weldon Inches Weeks in Treatment: 9 Encounter Discharge Information Items Discharge Condition: Stable Ambulatory Status: Ambulatory Discharge Destination: Home Transportation: Private Auto Schedule Follow-up Appointment: Yes Clinical Summary of Care: Patient Declined Electronic Signature(s) Signed: 10/19/2022 9:53:20 AM By: Yevonne Pax RN Entered By: Yevonne Pax on 10/17/2022 11:46:50 -------------------------------------------------------------------------------- Lower Extremity Assessment Details Patient Name: Date of Service: Larry Callahan MES F. 10/17/2022 11:00 A M Medical Record Number: [id number] Patient Account Number: [id number] Date of Birth/Sex: Treating RN: 1956/05/10 (67 y.o. Larry Callahan) Yevonne Pax Primary Care Dashanique Brownstein: Sallyanne Kuster Other Clinician: Referring Jakarie Pember: Treating Idonna Heeren/Extender: Weldon Inches Weeks in Treatment: 9 Electronic Signature(s) Signed: 10/19/2022 9:53:20 AM By: Yevonne Pax RN Entered By: Yevonne Pax on 10/17/2022 11:07:25 -------------------------------------------------------------------------------- Multi Wound Chart Details Patient Name: Date of Service: Larry Callahan MES F. 10/17/2022 11:00 A M Medical Record Number: [id number] Patient Account Number: [id number] Date of Birth/Sex: Treating RN: [birth year] (67 y.o. Larry Callahan) Yevonne Pax Primary Care Keaundra Stehle: Sallyanne Kuster Other Clinician: Referring Almetta Liddicoat: Treating Noriel Guthrie/Extender: Weldon Inches Weeks in Treatment: 9 Vital Signs Height(in): 71 Pulse(bpm): 99 Weight(lbs): 229 Blood Pressure(mmHg): 127/77 Body Mass Index(BMI): 31.9 Temperature(F): 97.5 Respiratory Rate(breaths/min): 18 [2:Photos:] [N/A:N/A] Right  Gluteus N/A N/A Wound Location: OSIRUS, BARDIN ([id number]) [id number] Page 4 of 7 Gradually Appeared N/A N/A Wounding Event: Abscess N/A N/A Primary Etiology: Chronic Obstructive Pulmonary N/A N/A Comorbid History: Disease (COPD), Arrhythmia, Coronary Artery Disease, Hypertension, Type II Diabetes 08/29/2022 N/A N/A Date Acquired: 6 N/A N/A Weeks of Treatment: Open N/A N/A Wound Status: No N/A N/A Wound Recurrence: 1.6x2.5x0.3 N/A N/A Measurements L x W x D (cm) 3.142 N/A N/A A (cm) : rea 0.942 N/A N/A  Volume (cm) : 54.30% N/A N/A % Reduction in Area: 54.30% N/A N/A % Reduction in Volume: Full Thickness Without Exposed N/A N/A Classification: Support Structures Medium N/A N/A Exudate Amount: Serosanguineous N/A N/A Exudate Type: red, brown N/A N/A Exudate Color: Large (67-100%) N/A N/A Granulation Amount: Red N/A N/A Granulation Quality: None Present (0%) N/A N/A Necrotic Amount: Fat Layer (Subcutaneous Tissue): Yes N/A N/A Exposed Structures: Fascia: No Tendon: No Muscle: No Joint: No Bone: No None N/A N/A Epithelialization: Treatment Notes Electronic Signature(s) Signed: 10/19/2022 9:53:20 AM By: Yevonne Pax RN Entered By: Yevonne Pax on 10/17/2022 11:07:29 -------------------------------------------------------------------------------- Multi-Disciplinary Care Plan Details Patient Name: Date of Service: Larry Callahan MES F. 10/17/2022 11:00 A M Medical Record Number: [id number] Patient Account Number: [id number] Date of Birth/Sex: Treating RN: [birth year] (67 y.o. Larry Callahan) Yevonne Pax Primary Care Robley Matassa: Sallyanne Kuster Other Clinician: Referring Alexcia Schools: Treating Arney Mayabb/Extender: Weldon Inches Weeks in Treatment: 9 Active Inactive Necrotic Tissue Nursing Diagnoses: Knowledge deficit related to management of necrotic/devitalized tissue Goals: Necrotic/devitalized tissue will be minimized in the  wound bed Date Initiated: 09/01/2022 Target Resolution Date: 11/01/2022 Goal Status: Active Interventions: Assess patient pain level pre-, during and post procedure and prior to discharge Provide education on necrotic tissue and debridement process Notes: Electronic Signature(s) Signed: 10/19/2022 9:53:20 AM By: Yevonne Pax RN Entered By: Yevonne Pax on 10/17/2022 11:07:39 [place] ([id number]) [id number] Page 5 of 7 -------------------------------------------------------------------------------- Pain Assessment Details Patient Name: Date of Service: Larry Callahan MES F. 10/17/2022 11:00 A M Medical Record Number: [id number] Patient Account Number: [id number] Date of Birth/Sex: Treating RN: 10/30/1955 (67 y.o. Larry Callahan) Yevonne Pax Primary Care Annabeth Tortora: Sallyanne Kuster Other Clinician: Referring Jalil Lorusso: Treating Alaena Strader/Extender: Weldon Inches Weeks in Treatment: 9 Active Problems Location of Pain Severity and Description of Pain Patient Has Paino No Site Locations Pain Management and Medication Current Pain Management: Electronic Signature(s) Signed: 10/19/2022 9:53:20 AM By: Yevonne Pax RN Entered By: Yevonne Pax on 10/17/2022 11:03:00 -------------------------------------------------------------------------------- Patient/Caregiver Education Details Patient Name: Date of Service: Larry Callahan MES F. 5/6/2024andnbsp11:00 A M Medical Record Number: [id number] Patient Account Number: [id number] Date of Birth/Gender: Treating RN: 1956-06-11 (67 y.o. Melonie [place] Primary Care Physician: Sallyanne Kuster Other Clinician: Referring Physician: Treating Physician/Extender: Weldon Inches Weeks in Treatment: 9 Education Assessment Education Provided To: Patient Education Topics Provided Wound/Skin Impairment: Handouts: Caring for Your Ulcer Methods: Explain/Verbal Responses: State content correctly Electronic  Signature(s) Signed: 10/19/2022 9:53:20 AM By: Yevonne Pax RN Entered By: Yevonne Pax on 10/17/2022 11:07:59 [place] ([id number]) [id number] Page 6 of 7 -------------------------------------------------------------------------------- Wound Assessment Details Patient Name: Date of Service: Larry Callahan MES F. 10/17/2022 11:00 A M Medical Record Number: [id number] Patient Account Number: [id number] Date of Birth/Sex: Treating RN: [birth year] (67 y.o. Larry Callahan) Yevonne Pax Primary Care Daizy Outen: Sallyanne Kuster Other Clinician: Referring Vlada Uriostegui: Treating Roniesha Hollingshead/Extender: Weldon Inches Weeks in Treatment: 9 Wound Status Wound Number: 2 Primary Abscess Etiology: Wound Location: Right Gluteus Wound Open Wounding Event: Gradually Appeared Status: Date Acquired: 08/29/2022 Comorbid Chronic Obstructive Pulmonary Disease (COPD), Arrhythmia, Weeks Of Treatment: 6 History: Coronary Artery Disease, Hypertension, Type II Diabetes Clustered Wound: No Photos Wound Measurements Length: (cm) 1.6 Width: (cm) 2.5 Depth: (cm) 0.3 Area: (cm) 3.142 Volume: (cm) 0.942 % Reduction in Area: 54.3% % Reduction in Volume: 54.3% Epithelialization: None Tunneling: No Undermining: No Wound Description Classification: Full Thickness Without Exposed Support Structures Exudate Amount: Medium Exudate Type: Serosanguineous Exudate Color: red, brown Foul Odor After Cleansing:  No Slough/Fibrino No Wound Bed Granulation Amount: Large (67-100%) Exposed Structure Granulation Quality: Red Fascia Exposed: No Necrotic Amount: None Present (0%) Fat Layer (Subcutaneous Tissue) Exposed: Yes Tendon Exposed: No Muscle Exposed: No Joint Exposed: No Bone Exposed: No Treatment Notes Wound #2 (Gluteus) Wound Laterality: Right Cleanser Soap and Water Discharge Instruction: Gently cleanse wound with antibacterial soap, rinse and pat dry prior to dressing  wounds Peri-Wound Care Topical Primary Dressing Hydrofera Blue Ready Transfer Foam, 2.5x2.5 (in/in) Discharge Instruction: Apply Hydrofera Blue Ready to wound bed as directed Secondary Dressing (BORDER) Zetuvit Plus SILICONE BORDER Dressing 4x4 (in/in) Discharge Instruction: Please do not put silicone bordered dressings under wraps. Use non-bordered dressing only. Secured With BERLIE, GAYLE ([id number]) [id number] Page 7 of 7 Compression Wrap Compression Stockings Add-Ons Electronic Signature(s) Signed: 10/19/2022 9:53:20 AM By: Yevonne Pax RN Entered By: Yevonne Pax on 10/17/2022 11:07:13 -------------------------------------------------------------------------------- Vitals Details Patient Name: Date of Service: Larry Callahan MES F. 10/17/2022 11:00 A M Medical Record Number: [id number] Patient Account Number: [id number] Date of Birth/Sex: Treating RN: [birth year] (67 y.o. Larry Callahan) Yevonne Pax Primary Care Keenan Trefry: Sallyanne Kuster Other Clinician: Referring Aarik Blank: Treating Soma Lizak/Extender: Weldon Inches Weeks in Treatment: 9 Vital Signs Time Taken: 11:02 Temperature (F): 97.5 Height (in): 71 Pulse (bpm): 99 Weight (lbs): 229 Respiratory Rate (breaths/min): 18 Body Mass Index (BMI): 31.9 Blood Pressure (mmHg): 127/77 Reference Range: 80 - 120 mg / dl Electronic Signature(s) Signed: 10/19/2022 9:53:20 AM By: Yevonne Pax RN Entered By: Yevonne Pax on 10/17/2022 11:02:53

## 2022-10-20 NOTE — Progress Notes (Signed)
  Care Coordination Note  10/20/2022 Name: Larry Callahan MRN: [id number] DOB: 1955-10-08  Larry Perkowski Shively is a 67 y.o. year old male who is a primary care patient of Sallyanne Kuster, NP and is actively engaged with the care management team. I reached out to [employer] Callahan by phone today to assist with re-scheduling an initial visit with the RN Case Manager  Follow up plan: We have been unable to make contact with the patient for follow up.   [name], CCMA Care Coordination Care Guide Direct Dial: [phone]

## 2022-10-21 DIAGNOSIS — E119 Type 2 diabetes mellitus without complications: Secondary | ICD-10-CM | POA: Diagnosis not present

## 2022-10-21 DIAGNOSIS — I110 Hypertensive heart disease with heart failure: Secondary | ICD-10-CM | POA: Diagnosis not present

## 2022-10-21 DIAGNOSIS — L0231 Cutaneous abscess of buttock: Secondary | ICD-10-CM | POA: Diagnosis not present

## 2022-10-21 DIAGNOSIS — Z602 Problems related to living alone: Secondary | ICD-10-CM | POA: Diagnosis not present

## 2022-10-21 DIAGNOSIS — G4733 Obstructive sleep apnea (adult) (pediatric): Secondary | ICD-10-CM | POA: Diagnosis not present

## 2022-10-21 DIAGNOSIS — E785 Hyperlipidemia, unspecified: Secondary | ICD-10-CM | POA: Diagnosis not present

## 2022-10-21 DIAGNOSIS — J449 Chronic obstructive pulmonary disease, unspecified: Secondary | ICD-10-CM | POA: Diagnosis not present

## 2022-10-21 DIAGNOSIS — I5043 Acute on chronic combined systolic (congestive) and diastolic (congestive) heart failure: Secondary | ICD-10-CM | POA: Diagnosis not present

## 2022-10-21 DIAGNOSIS — I509 Heart failure, unspecified: Secondary | ICD-10-CM | POA: Diagnosis not present

## 2022-10-21 DIAGNOSIS — I2510 Atherosclerotic heart disease of native coronary artery without angina pectoris: Secondary | ICD-10-CM | POA: Diagnosis not present

## 2022-10-24 ENCOUNTER — Ambulatory Visit: Payer: Medicare PPO | Admitting: Physician Assistant

## 2022-10-24 DIAGNOSIS — Z602 Problems related to living alone: Secondary | ICD-10-CM | POA: Diagnosis not present

## 2022-10-24 DIAGNOSIS — L0231 Cutaneous abscess of buttock: Secondary | ICD-10-CM | POA: Diagnosis not present

## 2022-10-24 DIAGNOSIS — J449 Chronic obstructive pulmonary disease, unspecified: Secondary | ICD-10-CM | POA: Diagnosis not present

## 2022-10-24 DIAGNOSIS — E785 Hyperlipidemia, unspecified: Secondary | ICD-10-CM | POA: Diagnosis not present

## 2022-10-24 DIAGNOSIS — I2510 Atherosclerotic heart disease of native coronary artery without angina pectoris: Secondary | ICD-10-CM | POA: Diagnosis not present

## 2022-10-24 DIAGNOSIS — G4733 Obstructive sleep apnea (adult) (pediatric): Secondary | ICD-10-CM | POA: Diagnosis not present

## 2022-10-24 DIAGNOSIS — I509 Heart failure, unspecified: Secondary | ICD-10-CM | POA: Diagnosis not present

## 2022-10-24 DIAGNOSIS — E119 Type 2 diabetes mellitus without complications: Secondary | ICD-10-CM | POA: Diagnosis not present

## 2022-10-24 DIAGNOSIS — I110 Hypertensive heart disease with heart failure: Secondary | ICD-10-CM | POA: Diagnosis not present

## 2022-10-25 ENCOUNTER — Encounter: Payer: Medicare PPO | Admitting: Physician Assistant

## 2022-10-25 DIAGNOSIS — I2510 Atherosclerotic heart disease of native coronary artery without angina pectoris: Secondary | ICD-10-CM | POA: Diagnosis not present

## 2022-10-25 DIAGNOSIS — J449 Chronic obstructive pulmonary disease, unspecified: Secondary | ICD-10-CM | POA: Diagnosis not present

## 2022-10-25 DIAGNOSIS — L0231 Cutaneous abscess of buttock: Secondary | ICD-10-CM | POA: Diagnosis not present

## 2022-10-25 DIAGNOSIS — I10 Essential (primary) hypertension: Secondary | ICD-10-CM | POA: Diagnosis not present

## 2022-10-25 DIAGNOSIS — E119 Type 2 diabetes mellitus without complications: Secondary | ICD-10-CM | POA: Diagnosis not present

## 2022-10-26 DIAGNOSIS — G4733 Obstructive sleep apnea (adult) (pediatric): Secondary | ICD-10-CM | POA: Diagnosis not present

## 2022-10-26 DIAGNOSIS — J449 Chronic obstructive pulmonary disease, unspecified: Secondary | ICD-10-CM | POA: Diagnosis not present

## 2022-10-26 DIAGNOSIS — I2510 Atherosclerotic heart disease of native coronary artery without angina pectoris: Secondary | ICD-10-CM | POA: Diagnosis not present

## 2022-10-26 DIAGNOSIS — L0231 Cutaneous abscess of buttock: Secondary | ICD-10-CM | POA: Diagnosis not present

## 2022-10-26 DIAGNOSIS — I110 Hypertensive heart disease with heart failure: Secondary | ICD-10-CM | POA: Diagnosis not present

## 2022-10-26 DIAGNOSIS — E785 Hyperlipidemia, unspecified: Secondary | ICD-10-CM | POA: Diagnosis not present

## 2022-10-26 DIAGNOSIS — I509 Heart failure, unspecified: Secondary | ICD-10-CM | POA: Diagnosis not present

## 2022-10-26 DIAGNOSIS — Z602 Problems related to living alone: Secondary | ICD-10-CM | POA: Diagnosis not present

## 2022-10-26 DIAGNOSIS — E119 Type 2 diabetes mellitus without complications: Secondary | ICD-10-CM | POA: Diagnosis not present

## 2022-10-28 ENCOUNTER — Other Ambulatory Visit: Payer: Self-pay | Admitting: Family

## 2022-10-28 DIAGNOSIS — I110 Hypertensive heart disease with heart failure: Secondary | ICD-10-CM | POA: Diagnosis not present

## 2022-10-28 DIAGNOSIS — G4733 Obstructive sleep apnea (adult) (pediatric): Secondary | ICD-10-CM | POA: Diagnosis not present

## 2022-10-28 DIAGNOSIS — J449 Chronic obstructive pulmonary disease, unspecified: Secondary | ICD-10-CM | POA: Diagnosis not present

## 2022-10-28 DIAGNOSIS — I2510 Atherosclerotic heart disease of native coronary artery without angina pectoris: Secondary | ICD-10-CM | POA: Diagnosis not present

## 2022-10-28 DIAGNOSIS — Z602 Problems related to living alone: Secondary | ICD-10-CM | POA: Diagnosis not present

## 2022-10-28 DIAGNOSIS — E785 Hyperlipidemia, unspecified: Secondary | ICD-10-CM | POA: Diagnosis not present

## 2022-10-28 DIAGNOSIS — I509 Heart failure, unspecified: Secondary | ICD-10-CM | POA: Diagnosis not present

## 2022-10-28 DIAGNOSIS — L0231 Cutaneous abscess of buttock: Secondary | ICD-10-CM | POA: Diagnosis not present

## 2022-10-28 DIAGNOSIS — E119 Type 2 diabetes mellitus without complications: Secondary | ICD-10-CM | POA: Diagnosis not present

## 2022-10-31 ENCOUNTER — Other Ambulatory Visit: Payer: Self-pay | Admitting: Cardiovascular Disease

## 2022-10-31 DIAGNOSIS — I483 Typical atrial flutter: Secondary | ICD-10-CM

## 2022-10-31 NOTE — Telephone Encounter (Signed)
Prescription refill request for Eliquis received. Indication:a flutter Last office visit:4/24 Scr:2.7 Age: 67 Weight:95  kg  Prescription refilled

## 2022-10-31 NOTE — Telephone Encounter (Signed)
Please review

## 2022-11-01 ENCOUNTER — Encounter: Payer: Medicare PPO | Admitting: Physician Assistant

## 2022-11-01 DIAGNOSIS — E119 Type 2 diabetes mellitus without complications: Secondary | ICD-10-CM | POA: Diagnosis not present

## 2022-11-01 DIAGNOSIS — I10 Essential (primary) hypertension: Secondary | ICD-10-CM | POA: Diagnosis not present

## 2022-11-01 DIAGNOSIS — J449 Chronic obstructive pulmonary disease, unspecified: Secondary | ICD-10-CM | POA: Diagnosis not present

## 2022-11-01 DIAGNOSIS — L0231 Cutaneous abscess of buttock: Secondary | ICD-10-CM | POA: Diagnosis not present

## 2022-11-01 DIAGNOSIS — I2510 Atherosclerotic heart disease of native coronary artery without angina pectoris: Secondary | ICD-10-CM | POA: Diagnosis not present

## 2022-11-01 NOTE — Progress Notes (Signed)
LUCKAS, PORRO ([id number]) [id number] Page 1 of 1 Visit Report for 11/01/2022 Chief Complaint Document Details Patient Name: Date of Service: Larry Callahan MES F. 11/01/2022 9:00 A M Medical Record Number: [id number] Patient Account Number: [id number] Date of Birth/Sex: Treating RN: Jul 14, 1955 (67 y.o. Roel Cluck Primary Care Provider: Sallyanne Kuster Other Clinician: Betha Loa Referring Provider: Treating Provider/Extender: Weldon Inches Weeks in Treatment: 11 Information Obtained from: Patient Chief Complaint Right gluteal abscess Electronic Signature(s) Signed: 11/01/2022 9:01:12 AM By: Allen Derry PA-C Entered By: Allen Derry on 11/01/2022 09:01:11 -------------------------------------------------------------------------------- Problem List Details Patient Name: Date of Service: Larry Callahan MES F. 11/01/2022 9:00 A M Medical Record Number: [id number] Patient Account Number: [id number] Date of Birth/Sex: Treating RN: Sep 22, 1955 (67 y.o. Roel Cluck Primary Care Provider: Sallyanne Kuster Other Clinician: Betha Loa Referring Provider: Treating Provider/Extender: Weldon Inches Weeks in Treatment: 11 Active Problems ICD-10 Encounter Code Description Active Date MDM Diagnosis L02.31 Cutaneous abscess of buttock 08/15/2022 No Yes Inactive Problems Resolved Problems Electronic Signature(s) Signed: 11/01/2022 9:01:06 AM By: Allen Derry PA-C Entered By: Allen Derry on 11/01/2022 09:01:05

## 2022-11-02 ENCOUNTER — Other Ambulatory Visit: Payer: Self-pay | Admitting: Nurse Practitioner

## 2022-11-02 DIAGNOSIS — E785 Hyperlipidemia, unspecified: Secondary | ICD-10-CM | POA: Diagnosis not present

## 2022-11-02 DIAGNOSIS — Z602 Problems related to living alone: Secondary | ICD-10-CM | POA: Diagnosis not present

## 2022-11-02 DIAGNOSIS — J449 Chronic obstructive pulmonary disease, unspecified: Secondary | ICD-10-CM | POA: Diagnosis not present

## 2022-11-02 DIAGNOSIS — I110 Hypertensive heart disease with heart failure: Secondary | ICD-10-CM | POA: Diagnosis not present

## 2022-11-02 DIAGNOSIS — L0231 Cutaneous abscess of buttock: Secondary | ICD-10-CM | POA: Diagnosis not present

## 2022-11-02 DIAGNOSIS — I2510 Atherosclerotic heart disease of native coronary artery without angina pectoris: Secondary | ICD-10-CM | POA: Diagnosis not present

## 2022-11-02 DIAGNOSIS — E119 Type 2 diabetes mellitus without complications: Secondary | ICD-10-CM | POA: Diagnosis not present

## 2022-11-02 DIAGNOSIS — I509 Heart failure, unspecified: Secondary | ICD-10-CM | POA: Diagnosis not present

## 2022-11-02 DIAGNOSIS — Z794 Long term (current) use of insulin: Secondary | ICD-10-CM

## 2022-11-02 DIAGNOSIS — G4733 Obstructive sleep apnea (adult) (pediatric): Secondary | ICD-10-CM | POA: Diagnosis not present

## 2022-11-04 DIAGNOSIS — G4733 Obstructive sleep apnea (adult) (pediatric): Secondary | ICD-10-CM | POA: Diagnosis not present

## 2022-11-04 DIAGNOSIS — L0231 Cutaneous abscess of buttock: Secondary | ICD-10-CM | POA: Diagnosis not present

## 2022-11-04 DIAGNOSIS — Z602 Problems related to living alone: Secondary | ICD-10-CM | POA: Diagnosis not present

## 2022-11-04 DIAGNOSIS — I110 Hypertensive heart disease with heart failure: Secondary | ICD-10-CM | POA: Diagnosis not present

## 2022-11-04 DIAGNOSIS — I2510 Atherosclerotic heart disease of native coronary artery without angina pectoris: Secondary | ICD-10-CM | POA: Diagnosis not present

## 2022-11-04 DIAGNOSIS — I509 Heart failure, unspecified: Secondary | ICD-10-CM | POA: Diagnosis not present

## 2022-11-04 DIAGNOSIS — E119 Type 2 diabetes mellitus without complications: Secondary | ICD-10-CM | POA: Diagnosis not present

## 2022-11-04 DIAGNOSIS — J449 Chronic obstructive pulmonary disease, unspecified: Secondary | ICD-10-CM | POA: Diagnosis not present

## 2022-11-04 DIAGNOSIS — E785 Hyperlipidemia, unspecified: Secondary | ICD-10-CM | POA: Diagnosis not present

## 2022-11-07 DIAGNOSIS — I2510 Atherosclerotic heart disease of native coronary artery without angina pectoris: Secondary | ICD-10-CM | POA: Diagnosis not present

## 2022-11-07 DIAGNOSIS — E785 Hyperlipidemia, unspecified: Secondary | ICD-10-CM | POA: Diagnosis not present

## 2022-11-07 DIAGNOSIS — I110 Hypertensive heart disease with heart failure: Secondary | ICD-10-CM | POA: Diagnosis not present

## 2022-11-07 DIAGNOSIS — J449 Chronic obstructive pulmonary disease, unspecified: Secondary | ICD-10-CM | POA: Diagnosis not present

## 2022-11-07 DIAGNOSIS — L0231 Cutaneous abscess of buttock: Secondary | ICD-10-CM | POA: Diagnosis not present

## 2022-11-07 DIAGNOSIS — I509 Heart failure, unspecified: Secondary | ICD-10-CM | POA: Diagnosis not present

## 2022-11-07 DIAGNOSIS — E119 Type 2 diabetes mellitus without complications: Secondary | ICD-10-CM | POA: Diagnosis not present

## 2022-11-07 DIAGNOSIS — Z602 Problems related to living alone: Secondary | ICD-10-CM | POA: Diagnosis not present

## 2022-11-07 DIAGNOSIS — G4733 Obstructive sleep apnea (adult) (pediatric): Secondary | ICD-10-CM | POA: Diagnosis not present

## 2022-11-09 DIAGNOSIS — I110 Hypertensive heart disease with heart failure: Secondary | ICD-10-CM | POA: Diagnosis not present

## 2022-11-09 DIAGNOSIS — Z602 Problems related to living alone: Secondary | ICD-10-CM | POA: Diagnosis not present

## 2022-11-09 DIAGNOSIS — I509 Heart failure, unspecified: Secondary | ICD-10-CM | POA: Diagnosis not present

## 2022-11-09 DIAGNOSIS — G4733 Obstructive sleep apnea (adult) (pediatric): Secondary | ICD-10-CM | POA: Diagnosis not present

## 2022-11-09 DIAGNOSIS — E785 Hyperlipidemia, unspecified: Secondary | ICD-10-CM | POA: Diagnosis not present

## 2022-11-09 DIAGNOSIS — J449 Chronic obstructive pulmonary disease, unspecified: Secondary | ICD-10-CM | POA: Diagnosis not present

## 2022-11-09 DIAGNOSIS — L0231 Cutaneous abscess of buttock: Secondary | ICD-10-CM | POA: Diagnosis not present

## 2022-11-09 DIAGNOSIS — E119 Type 2 diabetes mellitus without complications: Secondary | ICD-10-CM | POA: Diagnosis not present

## 2022-11-09 DIAGNOSIS — I2510 Atherosclerotic heart disease of native coronary artery without angina pectoris: Secondary | ICD-10-CM | POA: Diagnosis not present

## 2022-11-10 ENCOUNTER — Encounter: Payer: Medicare PPO | Admitting: Physician Assistant

## 2022-11-10 ENCOUNTER — Other Ambulatory Visit: Payer: Self-pay | Admitting: Internal Medicine

## 2022-11-10 DIAGNOSIS — I2510 Atherosclerotic heart disease of native coronary artery without angina pectoris: Secondary | ICD-10-CM | POA: Diagnosis not present

## 2022-11-10 DIAGNOSIS — L0231 Cutaneous abscess of buttock: Secondary | ICD-10-CM | POA: Diagnosis not present

## 2022-11-10 DIAGNOSIS — I10 Essential (primary) hypertension: Secondary | ICD-10-CM | POA: Diagnosis not present

## 2022-11-10 DIAGNOSIS — J449 Chronic obstructive pulmonary disease, unspecified: Secondary | ICD-10-CM | POA: Diagnosis not present

## 2022-11-10 DIAGNOSIS — E119 Type 2 diabetes mellitus without complications: Secondary | ICD-10-CM | POA: Diagnosis not present

## 2022-11-10 DIAGNOSIS — S61102A Unspecified open wound of left thumb with damage to nail, initial encounter: Secondary | ICD-10-CM | POA: Diagnosis not present

## 2022-11-11 DIAGNOSIS — G4733 Obstructive sleep apnea (adult) (pediatric): Secondary | ICD-10-CM | POA: Diagnosis not present

## 2022-11-11 DIAGNOSIS — I2510 Atherosclerotic heart disease of native coronary artery without angina pectoris: Secondary | ICD-10-CM | POA: Diagnosis not present

## 2022-11-11 DIAGNOSIS — E119 Type 2 diabetes mellitus without complications: Secondary | ICD-10-CM | POA: Diagnosis not present

## 2022-11-11 DIAGNOSIS — E785 Hyperlipidemia, unspecified: Secondary | ICD-10-CM | POA: Diagnosis not present

## 2022-11-11 DIAGNOSIS — J449 Chronic obstructive pulmonary disease, unspecified: Secondary | ICD-10-CM | POA: Diagnosis not present

## 2022-11-11 DIAGNOSIS — I509 Heart failure, unspecified: Secondary | ICD-10-CM | POA: Diagnosis not present

## 2022-11-11 DIAGNOSIS — Z602 Problems related to living alone: Secondary | ICD-10-CM | POA: Diagnosis not present

## 2022-11-11 DIAGNOSIS — L0231 Cutaneous abscess of buttock: Secondary | ICD-10-CM | POA: Diagnosis not present

## 2022-11-11 DIAGNOSIS — I110 Hypertensive heart disease with heart failure: Secondary | ICD-10-CM | POA: Diagnosis not present

## 2022-11-14 ENCOUNTER — Emergency Department: Payer: Medicare PPO

## 2022-11-14 ENCOUNTER — Emergency Department
Admission: EM | Admit: 2022-11-14 | Discharge: 2022-11-15 | Disposition: A | Discharge status: Home / Self Care | Payer: Medicare PPO | Attending: Emergency Medicine | Admitting: Emergency Medicine

## 2022-11-14 ENCOUNTER — Other Ambulatory Visit: Payer: Self-pay

## 2022-11-14 ENCOUNTER — Encounter: Payer: Self-pay | Admitting: Intensive Care

## 2022-11-14 DIAGNOSIS — L0231 Cutaneous abscess of buttock: Secondary | ICD-10-CM | POA: Diagnosis not present

## 2022-11-14 DIAGNOSIS — I509 Heart failure, unspecified: Secondary | ICD-10-CM | POA: Diagnosis not present

## 2022-11-14 DIAGNOSIS — D649 Anemia, unspecified: Secondary | ICD-10-CM

## 2022-11-14 DIAGNOSIS — J449 Chronic obstructive pulmonary disease, unspecified: Secondary | ICD-10-CM | POA: Insufficient documentation

## 2022-11-14 DIAGNOSIS — I2510 Atherosclerotic heart disease of native coronary artery without angina pectoris: Secondary | ICD-10-CM | POA: Diagnosis not present

## 2022-11-14 DIAGNOSIS — Z7901 Long term (current) use of anticoagulants: Secondary | ICD-10-CM | POA: Insufficient documentation

## 2022-11-14 DIAGNOSIS — E119 Type 2 diabetes mellitus without complications: Secondary | ICD-10-CM | POA: Diagnosis not present

## 2022-11-14 DIAGNOSIS — J9811 Atelectasis: Secondary | ICD-10-CM | POA: Diagnosis not present

## 2022-11-14 DIAGNOSIS — K625 Hemorrhage of anus and rectum: Secondary | ICD-10-CM

## 2022-11-14 DIAGNOSIS — R109 Unspecified abdominal pain: Secondary | ICD-10-CM | POA: Diagnosis present

## 2022-11-14 DIAGNOSIS — G4733 Obstructive sleep apnea (adult) (pediatric): Secondary | ICD-10-CM | POA: Diagnosis not present

## 2022-11-14 DIAGNOSIS — K921 Melena: Secondary | ICD-10-CM | POA: Diagnosis not present

## 2022-11-14 DIAGNOSIS — I10 Essential (primary) hypertension: Secondary | ICD-10-CM | POA: Diagnosis not present

## 2022-11-14 DIAGNOSIS — I110 Hypertensive heart disease with heart failure: Secondary | ICD-10-CM | POA: Insufficient documentation

## 2022-11-14 DIAGNOSIS — Z602 Problems related to living alone: Secondary | ICD-10-CM | POA: Diagnosis not present

## 2022-11-14 DIAGNOSIS — E785 Hyperlipidemia, unspecified: Secondary | ICD-10-CM | POA: Diagnosis not present

## 2022-11-14 LAB — BASIC METABOLIC PANEL
Anion gap: 10 (ref 5–15)
BUN: 64 mg/dL — ABNORMAL HIGH (ref 8–23)
CO2: 19 mmol/L — ABNORMAL LOW (ref 22–32)
Calcium: 8.1 mg/dL — ABNORMAL LOW (ref 8.9–10.3)
Chloride: 107 mmol/L (ref 98–111)
Creatinine, Ser: 2.75 mg/dL — ABNORMAL HIGH (ref 0.61–1.24)
GFR, Estimated: 24 mL/min — ABNORMAL LOW (ref >60)
Glucose, Bld: 69 mg/dL — ABNORMAL LOW (ref 70–99)
Potassium: 3.9 mmol/L (ref 3.5–5.1)
Sodium: 136 mmol/L (ref 135–145)

## 2022-11-14 LAB — CBC
HCT: 26.7 % — ABNORMAL LOW (ref 39.0–52.0)
HCT: 26.6 % — ABNORMAL LOW (ref 39.0–52.0)
Hemoglobin: 8.2 g/dL — ABNORMAL LOW (ref 13.0–17.0)
Hemoglobin: 8 g/dL — ABNORMAL LOW (ref 13.0–17.0)
MCH: 27 pg (ref 26.0–34.0)
MCH: 26.5 pg (ref 26.0–34.0)
MCHC: 30.7 g/dL (ref 30.0–36.0)
MCHC: 30.1 g/dL (ref 30.0–36.0)
MCV: 87.8 fL (ref 80.0–100.0)
MCV: 88.1 fL (ref 80.0–100.0)
Platelets: 189 10*3/uL (ref 150–400)
Platelets: 177 10*3/uL (ref 150–400)
RBC: 3.04 MIL/uL — ABNORMAL LOW (ref 4.22–5.81)
RBC: 3.02 MIL/uL — ABNORMAL LOW (ref 4.22–5.81)
RDW: 15.5 % (ref 11.5–15.5)
RDW: 15.5 % (ref 11.5–15.5)
WBC: 3.2 10*3/uL — ABNORMAL LOW (ref 4.0–10.5)
WBC: 2.8 10*3/uL — ABNORMAL LOW (ref 4.0–10.5)
nRBC: 0 % (ref 0.0–0.2)
nRBC: 0 % (ref 0.0–0.2)

## 2022-11-14 LAB — COMPREHENSIVE METABOLIC PANEL
ALT: 18 U/L (ref 0–44)
AST: 28 U/L (ref 15–41)
Albumin: 2.6 g/dL — ABNORMAL LOW (ref 3.5–5.0)
Alkaline Phosphatase: 62 U/L (ref 38–126)
Anion gap: 9 (ref 5–15)
BUN: 68 mg/dL — ABNORMAL HIGH (ref 8–23)
CO2: 17 mmol/L — ABNORMAL LOW (ref 22–32)
Calcium: 8.3 mg/dL — ABNORMAL LOW (ref 8.9–10.3)
Chloride: 110 mmol/L (ref 98–111)
Creatinine, Ser: 2.77 mg/dL — ABNORMAL HIGH (ref 0.61–1.24)
GFR, Estimated: 24 mL/min — ABNORMAL LOW (ref >60)
Glucose, Bld: 98 mg/dL (ref 70–99)
Potassium: 4.2 mmol/L (ref 3.5–5.1)
Sodium: 136 mmol/L (ref 135–145)
Total Bilirubin: 0.8 mg/dL (ref 0.3–1.2)
Total Protein: 7.2 g/dL (ref 6.5–8.1)

## 2022-11-14 LAB — TYPE AND SCREEN
ABO/RH(D): A POS
Antibody Screen: NEGATIVE

## 2022-11-14 MED ORDER — SODIUM CHLORIDE 0.9 % IV BOLUS
500.0000 mL | Freq: Once | INTRAVENOUS | Status: AC
  Administered 2022-11-14: 500 mL via INTRAVENOUS

## 2022-11-14 MED ORDER — ONDANSETRON HCL 4 MG/2ML IJ SOLN
4.0000 mg | Freq: Once | INTRAMUSCULAR | Status: AC
  Administered 2022-11-14: 4 mg via INTRAVENOUS
  Filled 2022-11-14: qty 2

## 2022-11-14 MED ORDER — FUROSEMIDE 10 MG/ML IJ SOLN: 40.0000 mg | Freq: Once | INTRAMUSCULAR | Status: DC

## 2022-11-14 MED ORDER — CYANOCOBALAMIN 1000 MCG PO TABS: 1000.0000 ug | ORAL_TABLET | Freq: Every day | ORAL | 1 refills | Status: DC

## 2022-11-14 MED ORDER — FAMOTIDINE IN NACL 20-0.9 MG/50ML-% IV SOLN
20.0000 mg | Freq: Once | INTRAVENOUS | Status: AC
  Administered 2022-11-14: 20 mg via INTRAVENOUS
  Filled 2022-11-14: qty 50

## 2022-11-14 NOTE — ED Triage Notes (Signed)
Arrives from home via [facility]   C/O blood in stool.  Today.  VS wnl.  Patient states he has a wound to buttocks, but know that the blood is not from the wound.

## 2022-11-14 NOTE — ED Provider Notes (Signed)
[facility] Provider Note    Event Date/Time   First MD Initiated Contact with Patient 11/14/22 1720     (approximate)   History   Rectal Bleeding   HPI  Larry Callahan is a 67 y.o. male past medical history significant for COPD, diabetes, hypertension, CAD, atrial fibrillation on Eliquis, known sacral wounds, who presents to the emergency department with concern for rectal bleeding.  States that he had 2 bowel movements today and noted dark red blood that was around the stool.  States that he knew it was not his wounds that were bleeding.  Complaining of some abdominal pain since that time.  Denies any nausea or vomiting.  Endorses shortness of breath that has been ongoing.  Denies any new cough.  No sputum production.  Uncertain of his last colonoscopy.     Physical Exam   Triage Vital Signs: ED Triage Vitals  Enc Vitals Group     BP 11/14/22 1706 (!) 154/82     Pulse Rate 11/14/22 1706 (!) 118     Resp 11/14/22 1706 (!) 22     Temp 11/14/22 1706 98.2 F (36.8 C)     Temp Source 11/14/22 1706 Oral     SpO2 11/14/22 1706 97 %     Weight 11/14/22 1703 200 lb (90.7 kg)     Height 11/14/22 1703 5\' 11"  (1.803 m)     Head Circumference --      Peak Flow --      Pain Score 11/14/22 1703 0     Pain Loc --      Pain Edu? --      Excl. in GC? --     Most recent vital signs: Vitals:   11/14/22 1706  BP: (!) 154/82  Pulse: (!) 118  Resp: (!) 22  Temp: 98.2 F (36.8 C)  SpO2: 97%    Physical Exam Constitutional:      Appearance: He is well-developed.  HENT:     Head: Atraumatic.  Eyes:     Conjunctiva/sclera: Conjunctivae normal.  Cardiovascular:     Rate and Rhythm: Regular rhythm.  Pulmonary:     Effort: No respiratory distress.     Comments: Coarse breath sounds but no significant wheezing.  100% on 2 L nasal cannula however nasal cannula was next to his nose and not wearing nasal cannula at this time.  Speaking in full  sentences. Abdominal:     Tenderness: There is no abdominal tenderness. There is no right CVA tenderness or left CVA tenderness.  Genitourinary:    Rectum: Guaiac result negative.     Comments: 2 wounds near the rectum.  1 wound with no surrounding erythema warmth or induration.  Dressing in place.  Second wound with no surrounding erythema or warmth.  Appeared to have some dark red bleeding.  No ongoing active bleeding at this time.  No external hemorrhoids noted.  No gross blood or melena.  Stool was a brown color.  Guaiac was negative. Musculoskeletal:     Cervical back: Normal range of motion.     Right lower leg: No edema.     Left lower leg: No edema.  Skin:    General: Skin is warm.  Neurological:     Mental Status: He is alert. Mental status is at baseline.     IMPRESSION / MDM / ASSESSMENT AND PLAN / ED COURSE  I reviewed the triage vital signs and the nursing notes.  Differential diagnosis  including significant upper GI bleed, lower GI bleed, bleed from his wound near his rectum, anemia, heart failure, COPD  EKG  I, Corena Herter, the attending physician, personally viewed and interpreted this ECG.  Atrial flutter with variable AV block.  Heart rate 116.  No significant ST changes.  2 PVCs noted.  No significant change when compared to prior EKG.  Atrial flutter while on cardiac telemetry.  RADIOLOGY I independently reviewed imaging, my interpretation of imaging: Chest x-ray with no focal findings consistent with pneumonia.  Read as cardiomegaly with trace pulmonary edema  LABS (all labs ordered are listed, but only abnormal results are displayed) Labs interpreted as -    Labs Reviewed  COMPREHENSIVE METABOLIC PANEL - Abnormal; Notable for the following components:      Result Value   CO2 17 (*)    BUN 68 (*)    Creatinine, Ser 2.77 (*)    Calcium 8.3 (*)    Albumin 2.6 (*)    GFR, Estimated 24 (*)    All other components within normal limits  CBC - Abnormal;  Notable for the following components:   WBC 3.2 (*)    RBC 3.04 (*)    Hemoglobin 8.2 (*)    HCT 26.7 (*)    All other components within normal limits  CBC - Abnormal; Notable for the following components:   WBC 2.8 (*)    RBC 3.02 (*)    Hemoglobin 8.0 (*)    HCT 26.6 (*)    All other components within normal limits  BASIC METABOLIC PANEL - Abnormal; Notable for the following components:   CO2 19 (*)    Glucose, Bld 69 (*)    BUN 64 (*)    Creatinine, Ser 2.75 (*)    Calcium 8.1 (*)    GFR, Estimated 24 (*)    All other components within normal limits  POC OCCULT BLOOD, ED  TYPE AND SCREEN     MDM  Patient with no gross blood or melena, guaiac negative on my exam.  Have low suspicion for rectal bleeding, bleeding appears to be coming from the wound near his rectum.  On chart review patient is followed by wound clinic.  No surrounding erythema or warmth.  No induration.  No concern for perirectal or perianal abscess.  Patient does have anemia, appears chronic in nature and chronic leukopenia.  No significant hemoglobin drop when compared to prior.  Repeat hemoglobin level in the emergency department after 4 hours has been unchanged.  No further episodes of rectal bleeding.  Does not meet criteria for blood transfusion.  Chest x-ray with trace edema but no findings of a pneumonia.  No significant pulmonary edema.  Patient continues to be 100% on room air.  Speaking in full sentences.  Tachycardia resolved.  Discussed close follow-up with primary care physician and in heart failure clinic.  Discussed that he needed repeat hemoglobin level and to follow-up closely with his primary care provider.  Given return precautions for any worsening symptoms.  Patient is unable to find a ride home, attempted to call multiple people and call his sister however sister is not able to drive at nighttime.  Patient was given graham crackers and given something to drink.  Discussed that he would be  able to sleep in a recliner in the waiting room.     PROCEDURES:  Critical Care performed: No  Procedures  Patient's presentation is most consistent with acute presentation with potential threat to life or  bodily function.   MEDICATIONS ORDERED IN ED: Medications  furosemide (LASIX) injection 40 mg (has no administration in time range)  sodium chloride 0.9 % bolus 500 mL (0 mLs Intravenous Stopped 11/14/22 2047)  ondansetron (ZOFRAN) injection 4 mg (4 mg Intravenous Given 11/14/22 2123)  famotidine (PEPCID) IVPB 20 mg premix (20 mg Intravenous New Bag/Given 11/14/22 2153)    FINAL CLINICAL IMPRESSION(S) / ED DIAGNOSES   Final diagnoses:  Anemia, unspecified type  Acute on chronic congestive heart failure, unspecified heart failure type (HCC)  Rectal bleeding     Rx / DC Orders   ED Discharge Orders          Ordered    Ambulatory referral to Cardiology       Comments: If you have not heard from the Cardiology office within the next 72 hours please call [phone].   11/14/22 2247             Note:  This document was prepared using Dragon voice recognition software and may include unintentional dictation errors.   Corena Herter, MD 11/14/22 [phone]

## 2022-11-14 NOTE — ED Triage Notes (Signed)
Patient c/o blood in his stool. Could not specify how many days. Reports he does have wound to right buttocks. Reports the blood is not coming from the wound.   Patient reports he wears 2L O2 as needed.

## 2022-11-14 NOTE — ED Notes (Signed)
See triage note  Presents with some rectal bleeding  States he has had this happen in the past  Not sure how much  but noticed it over the past 1-2 days

## 2022-11-14 NOTE — Discharge Instructions (Addendum)
You were seen in the emergency department for rectal bleeding when having a bowel movement.  Concerned this is coming from one of the wounds near your rectum.  It is importantly follow-up closely with your primary care physician so they can recheck your stool and make sure you are not having any signs of a bleed.  You were found to be anemic in the emergency department, you do not meet criteria for blood transfusion, it is importantly follow-up closely this week so they can recheck your hemoglobin level and make sure that your red blood cell is not dropping lower and that you would need a blood transfusion.  You had a Hemoccult card in the emergency department that was negative for bleeding.  If you note worsening blood or black dark tarry stools it is important you return immediately to the emergency department.  You had a chest x-ray that showed mild edema.  You were given an extra IV Lasix dose in the emergency department.  Is importantly follow-up closely as an outpatient with cardiology so they can reevaluate your heart failure.  Return to the emergency department for any worsening shortness of breath.

## 2022-11-15 ENCOUNTER — Ambulatory Visit: Payer: Medicare PPO | Admitting: Podiatry

## 2022-11-15 ENCOUNTER — Ambulatory Visit (INDEPENDENT_AMBULATORY_CARE_PROVIDER_SITE_OTHER): Payer: Medicare PPO | Admitting: Podiatry

## 2022-11-15 DIAGNOSIS — Z91199 Patient's noncompliance with other medical treatment and regimen due to unspecified reason: Secondary | ICD-10-CM

## 2022-11-15 NOTE — ED Notes (Signed)
Contacted patients sister. She stated "ain't no one going to come out there and get him. He is on his own till morning." First nurse notified that patient would likely be spending the night in the lobby.

## 2022-11-15 NOTE — Progress Notes (Signed)
   Complete physical exam  Patient: Larry Callahan   DOB: 04/02/2001   67 y.o. Male  MRN: [id number]  Subjective:    No chief complaint on file.   Larry Callahan is a 67 y.o. male who presents today for a complete physical exam. She reports consuming a {diet types:17450} diet. {types:19826} She generally feels {DESC; WELL/FAIRLY WELL/POORLY:18703}. She reports sleeping {DESC; WELL/FAIRLY WELL/POORLY:18703}. She {does/does not:200015} have additional problems to discuss today.    Most recent fall risk assessment:    12/08/2021   10:42 AM  Fall Risk   Falls in the past year? 0  Number falls in past yr: 0  Injury with Fall? 0  Risk for fall due to : No Fall Risks  Follow up Falls evaluation completed     Most recent depression screenings:    12/08/2021   10:42 AM 10/29/2020   10:46 AM  PHQ 2/9 Scores  PHQ - 2 Score 0 0  PHQ- 9 Score 5     {VISON DENTAL STD PSA (Optional):27386}  {History (Optional):23778}  Patient Care Team: Jessup, Joy, NP as PCP - General (Nurse Practitioner)   Outpatient Medications Prior to Visit  Medication Sig   fluticasone (FLONASE) 50 MCG/ACT nasal spray Place 2 sprays into both nostrils in the morning and at bedtime. After 7 days, reduce to once daily.   norgestimate-ethinyl estradiol (SPRINTEC 28) 0.25-35 MG-MCG tablet Take 1 tablet by mouth daily.   Nystatin POWD Apply liberally to affected area 2 times per day   spironolactone (ALDACTONE) 100 MG tablet Take 1 tablet (100 mg total) by mouth daily.   No facility-administered medications prior to visit.    ROS        Objective:     There were no vitals taken for this visit. {Vitals History (Optional):23777}  Physical Exam   No results found for any visits on 01/13/22. {Show previous labs (optional):23779}    Assessment & Plan:    Routine Health Maintenance and Physical Exam  Immunization History  Administered Date(s) Administered   DTaP 06/16/1999, 08/12/1999,  10/21/1999, 07/06/2000, 01/20/2004   Hepatitis A 11/16/2007, 11/21/2008   Hepatitis B 04/03/1999, 05/11/1999, 10/21/1999   HiB (PRP-OMP) 06/16/1999, 08/12/1999, 10/21/1999, 07/06/2000   IPV 06/16/1999, 08/12/1999, 04/10/2000, 01/20/2004   Influenza,inj,Quad PF,6+ Mos 02/21/2014   Influenza-Unspecified 05/23/2012   MMR 04/10/2001, 01/20/2004   Meningococcal Polysaccharide 11/21/2011   Pneumococcal Conjugate-13 07/06/2000   Pneumococcal-Unspecified 10/21/1999, 01/04/2000   Tdap 11/21/2011   Varicella 04/10/2000, 11/16/2007    Health Maintenance  Topic Date Due   HIV Screening  Never done   Hepatitis C Screening  Never done   INFLUENZA VACCINE  01/11/2022   PAP-Cervical Cytology Screening  01/13/2022 (Originally 04/01/2020)   PAP SMEAR-Modifier  01/13/2022 (Originally 04/01/2020)   TETANUS/TDAP  01/13/2022 (Originally 11/20/2021)   HPV VACCINES  Discontinued   COVID-19 Vaccine  Discontinued    Discussed health benefits of physical activity, and encouraged her to engage in regular exercise appropriate for her age and condition.  Problem List Items Addressed This Visit   None Visit Diagnoses     Annual physical exam    -  Primary   Cervical cancer screening       Need for Tdap vaccination          No follow-ups on file.     Joy Jessup, NP   

## 2022-11-16 ENCOUNTER — Ambulatory Visit: Payer: Medicare PPO | Admitting: Podiatry

## 2022-11-16 DIAGNOSIS — Z602 Problems related to living alone: Secondary | ICD-10-CM | POA: Diagnosis not present

## 2022-11-16 DIAGNOSIS — I509 Heart failure, unspecified: Secondary | ICD-10-CM | POA: Diagnosis not present

## 2022-11-16 DIAGNOSIS — J449 Chronic obstructive pulmonary disease, unspecified: Secondary | ICD-10-CM | POA: Diagnosis not present

## 2022-11-16 DIAGNOSIS — I2510 Atherosclerotic heart disease of native coronary artery without angina pectoris: Secondary | ICD-10-CM | POA: Diagnosis not present

## 2022-11-16 DIAGNOSIS — L0231 Cutaneous abscess of buttock: Secondary | ICD-10-CM | POA: Diagnosis not present

## 2022-11-16 DIAGNOSIS — G4733 Obstructive sleep apnea (adult) (pediatric): Secondary | ICD-10-CM | POA: Diagnosis not present

## 2022-11-16 DIAGNOSIS — E119 Type 2 diabetes mellitus without complications: Secondary | ICD-10-CM | POA: Diagnosis not present

## 2022-11-16 DIAGNOSIS — E785 Hyperlipidemia, unspecified: Secondary | ICD-10-CM | POA: Diagnosis not present

## 2022-11-16 DIAGNOSIS — I110 Hypertensive heart disease with heart failure: Secondary | ICD-10-CM | POA: Diagnosis not present

## 2022-11-17 ENCOUNTER — Encounter: Payer: Medicare PPO | Attending: Physician Assistant | Admitting: Physician Assistant

## 2022-11-17 DIAGNOSIS — L0231 Cutaneous abscess of buttock: Secondary | ICD-10-CM | POA: Insufficient documentation

## 2022-11-17 DIAGNOSIS — I2510 Atherosclerotic heart disease of native coronary artery without angina pectoris: Secondary | ICD-10-CM | POA: Diagnosis not present

## 2022-11-17 DIAGNOSIS — I10 Essential (primary) hypertension: Secondary | ICD-10-CM | POA: Diagnosis not present

## 2022-11-17 DIAGNOSIS — J449 Chronic obstructive pulmonary disease, unspecified: Secondary | ICD-10-CM | POA: Insufficient documentation

## 2022-11-17 DIAGNOSIS — L98492 Non-pressure chronic ulcer of skin of other sites with fat layer exposed: Secondary | ICD-10-CM | POA: Diagnosis not present

## 2022-11-17 DIAGNOSIS — E11622 Type 2 diabetes mellitus with other skin ulcer: Secondary | ICD-10-CM | POA: Diagnosis not present

## 2022-11-17 DIAGNOSIS — S61211A Laceration without foreign body of left index finger without damage to nail, initial encounter: Secondary | ICD-10-CM | POA: Insufficient documentation

## 2022-11-17 DIAGNOSIS — E1151 Type 2 diabetes mellitus with diabetic peripheral angiopathy without gangrene: Secondary | ICD-10-CM | POA: Insufficient documentation

## 2022-11-18 DIAGNOSIS — L0231 Cutaneous abscess of buttock: Secondary | ICD-10-CM | POA: Diagnosis not present

## 2022-11-18 DIAGNOSIS — I2510 Atherosclerotic heart disease of native coronary artery without angina pectoris: Secondary | ICD-10-CM | POA: Diagnosis not present

## 2022-11-18 DIAGNOSIS — E785 Hyperlipidemia, unspecified: Secondary | ICD-10-CM | POA: Diagnosis not present

## 2022-11-18 DIAGNOSIS — Z602 Problems related to living alone: Secondary | ICD-10-CM | POA: Diagnosis not present

## 2022-11-18 DIAGNOSIS — E119 Type 2 diabetes mellitus without complications: Secondary | ICD-10-CM | POA: Diagnosis not present

## 2022-11-18 DIAGNOSIS — J449 Chronic obstructive pulmonary disease, unspecified: Secondary | ICD-10-CM | POA: Diagnosis not present

## 2022-11-18 DIAGNOSIS — I509 Heart failure, unspecified: Secondary | ICD-10-CM | POA: Diagnosis not present

## 2022-11-18 DIAGNOSIS — G4733 Obstructive sleep apnea (adult) (pediatric): Secondary | ICD-10-CM | POA: Diagnosis not present

## 2022-11-18 DIAGNOSIS — I110 Hypertensive heart disease with heart failure: Secondary | ICD-10-CM | POA: Diagnosis not present

## 2022-11-21 DIAGNOSIS — Z602 Problems related to living alone: Secondary | ICD-10-CM | POA: Diagnosis not present

## 2022-11-21 DIAGNOSIS — I2510 Atherosclerotic heart disease of native coronary artery without angina pectoris: Secondary | ICD-10-CM | POA: Diagnosis not present

## 2022-11-21 DIAGNOSIS — I110 Hypertensive heart disease with heart failure: Secondary | ICD-10-CM | POA: Diagnosis not present

## 2022-11-21 DIAGNOSIS — J449 Chronic obstructive pulmonary disease, unspecified: Secondary | ICD-10-CM | POA: Diagnosis not present

## 2022-11-21 DIAGNOSIS — I5043 Acute on chronic combined systolic (congestive) and diastolic (congestive) heart failure: Secondary | ICD-10-CM | POA: Diagnosis not present

## 2022-11-21 DIAGNOSIS — G4733 Obstructive sleep apnea (adult) (pediatric): Secondary | ICD-10-CM | POA: Diagnosis not present

## 2022-11-21 DIAGNOSIS — I509 Heart failure, unspecified: Secondary | ICD-10-CM | POA: Diagnosis not present

## 2022-11-21 DIAGNOSIS — E119 Type 2 diabetes mellitus without complications: Secondary | ICD-10-CM | POA: Diagnosis not present

## 2022-11-21 DIAGNOSIS — E785 Hyperlipidemia, unspecified: Secondary | ICD-10-CM | POA: Diagnosis not present

## 2022-11-21 DIAGNOSIS — L0231 Cutaneous abscess of buttock: Secondary | ICD-10-CM | POA: Diagnosis not present

## 2022-11-22 ENCOUNTER — Encounter: Payer: Medicare PPO | Admitting: Family

## 2022-11-22 ENCOUNTER — Telehealth: Payer: Self-pay | Admitting: Family

## 2022-11-22 NOTE — Progress Notes (Deleted)
PCP: Primary Cardiologist:  HPI:  Larry Callahan is a 67 yo male with a PMHx of nonischemic cardiomyopathy last EF measured in April 2022 was 35 to 40%, COPD, sleep apnea, chronic kidney disease stage IV, chronic anemia, hypertension, and diabetes mellitus.  Echo from 08/08/21 reviewed and showed and EF of 40-45%. There is mild to moderately decreased function of the left ventricle. The diastolic parameters are indeterminate. The RV systolic function is low normal.   RHC done 03/29/2019 and showed: Pulmonary capillary wedge pressure mean of 8 PA: 32/16 mean 23 RA mean of 10 RV 26/12/13  Cardiac output 8.17 Cardiac index 3.42 Final Conclusions:   Normal right heart pressures  Was in the ED 05/16/22 due to COPD exacerbation. Was in the ED 05/04/22 due to contusion after a fall. Was in the ED 02/22/22 due to concerns he was having a stroke and this was ruled out and he was released. Was in the ED 01/20/22 due to atypical chest pain where he was evaluated and released. Admitted 01/09/22 due to  nausea vomiting and noticing bright red blood in his vomit.  He also reported some mild shortness of breath, generalized weakness dizziness. Palliative care and GI consults obtained. EGD done.   Discharged after 3 days. Admitted 01/03/22 due to abdominal pain. Was given morphine with subsequent dizziness. CT abdomen with umbilical hernia with no obstruction and concern of enlarging right renal complex cyst which need further investigation. Surgical consult obtained. Discharged the next day. Admitted 12/17/21 due to acute on chronic heart failure. Discharged after 3 days. Was in the ED 12/06/21 due to needing antibiotic refill as he lost previous prescription. Admitted 11/30/21 due to swelling and pain around the right eye. Ophthamology consult obtained. Started on IV antibiotics due to periorbital cellulitis and then transitioned to oral antibiotics. Discharged after 4 days.   He presents for a follow-up visit with a  chief complaint of minimal fatigue upon exertion. Describes this as chronic in nature. He has associated cough, shortness of breath, weight loss, leg weakness and anxiety along with this. He denies any difficulty sleeping, dizziness, abdominal distention, palpitations, pedal edema, chest pain, wheezing or weight gain.   Hasn't been checking his glucose at home because he says that "something is wrong with it". He hasn't picked up his antibiotic from the pharmacy yet from his recent ED visit.      ROS: All systems negative except as listed in HPI, PMH and Problem List.  SH:  Social History   Socioeconomic History   Marital status: Single    Spouse name: Not on file   Number of children: Not on file   Years of education: Not on file   Highest education level: Not on file  Occupational History   Not on file  Tobacco Use   Smoking status: Former    Types: Cigarettes    Quit date: 03/13/2020    Years since quitting: 2.6   Smokeless tobacco: Former    Types: Associate Professor Use: Never used  Substance and Sexual Activity   Alcohol use: No   Drug use: No   Sexual activity: Not Currently  Other Topics Concern   Not on file  Social History Narrative   Not on file   Social Determinants of Health   Financial Resource Strain: Low Risk  (05/12/2021)   Overall Financial Resource Strain (CARDIA)    Difficulty of Paying Living Expenses: Not very hard  Food Insecurity: No Food Insecurity (  08/01/2022)   Hunger Vital Sign    Worried About Running Out of Food in the Last Year: Never true    Ran Out of Food in the Last Year: Never true  Transportation Needs: No Transportation Needs (08/01/2022)   PRAPARE - Administrator, Civil Service (Medical): No    Lack of Transportation (Non-Medical): No  Physical Activity: Not on file  Stress: Not on file  Social Connections: Not on file  Intimate Partner Violence: Not At Risk (08/01/2022)   Humiliation, Afraid, Rape, and Kick  questionnaire    Fear of Current or Ex-Partner: No    Emotionally Abused: No    Physically Abused: No    Sexually Abused: No    FH:  Family History  Problem Relation Age of Onset   Diabetes Mother        2003   Lung cancer Father    Diabetes Sister    Hypertension Sister    Heart disease Sister    Cancer Sister    Bone cancer Brother    Prostate cancer Neg Hx    Bladder Cancer Neg Hx    Kidney cancer Neg Hx     Past Medical History:  Diagnosis Date   Allergy    Atrial flutter (HCC)    a. Dx 12/2020--CHA2DS2VASc = 5-->eliquis.   Chronic combined systolic (congestive) and diastolic (congestive) heart failure (HCC)    a. 04/2019 Echo: EF 45-50%; b. 02/2019 Echo: EF 55-60%; c. 09/2020 Echo: EF 35-40%, glob HK. Nl RV size/fxn. Mild MR; d. 07/2021 Echo: EF 40-45%, mild LVH, low-nl RV fxn, no MR.   CKD (chronic kidney disease), stage III (HCC)    COPD (chronic obstructive pulmonary disease) (HCC)    Coronary artery disease    a. 2011 Cath: nonobs dzs; b. 09/2020 MV: EF 38%. No signif ischemia. ? inf MI vs diaph attenuation. GI uptake artifact.   Diabetes mellitus without complication (HCC)    GERD (gastroesophageal reflux disease)    History of CVA (cerebrovascular accident) 09/25/2020   Hyperlipidemia    Hypertension    Morbid (severe) obesity due to excess calories (HCC) 06/09/2017   Morbid obesity (HCC)    NICM (nonischemic cardiomyopathy) (HCC)    a. 04/2019 Echo: EF 45-50%; b. 02/2019 Echo: EF 55-60%; c. 09/2020 Echo: EF 35-40%, glob HK; d. 09/2020 MV: No ischemia. ? inf infarct vs attenuation; d. 07/2021 Echo: EF 40-45%.   OSA (obstructive sleep apnea)    Pneumonia due to COVID-19 virus 01/30/2020   Stage 3b chronic kidney disease (HCC)    Syncope and collapse 11/16/2020    Current Outpatient Medications  Medication Sig Dispense Refill   TOUJEO SOLOSTAR 300 UNIT/ML Solostar Pen Inject 26 Units into the skin daily. 4.5 mL 0   Accu-Chek Softclix Lancets lancets Use to check blood  sugars 4 times a day   E11.65 400 each 3   acetaminophen (TYLENOL) 325 MG tablet Take 2 tablets (650 mg total) by mouth every 6 (six) hours as needed for mild pain or moderate pain (or Fever >/= 101).     acetaminophen-codeine (TYLENOL #3) 300-30 MG tablet Take one tab po at night for left shoulder pain (Patient not taking: Reported on 11/14/2022) 30 tablet 0   alum & mag hydroxide-simeth (MAALOX/MYLANTA) 200-200-20 MG/5ML suspension Take 30 mLs by mouth every 4 (four) hours as needed for indigestion or heartburn. 355 mL 0   apixaban (ELIQUIS) 5 MG TABS tablet Take 1 tablet (5 mg total) by mouth  2 (two) times daily. 60 tablet 5   bisacodyl (DULCOLAX) 5 MG EC tablet Take 2 tablets (10 mg total) by mouth daily as needed for moderate constipation. 30 tablet 0   Blood Glucose Monitoring Suppl (TRUE METRIX AIR GLUCOSE METER) DEVI 1 Device by Does not apply route 2 (two) times daily. 1 Device 0   Budeson-Glycopyrrol-Formoterol (BREZTRI AEROSPHERE) 160-9-4.8 MCG/ACT AERO Inhale 2 puffs into the lungs 2 (two) times daily. (Patient taking differently: Inhale 2 puffs into the lungs 2 (two) times daily as needed.) 2 each 3   carvedilol (COREG) 12.5 MG tablet Take 1 tablet (12.5 mg total) by mouth 2 (two) times daily. 180 tablet 3   cyanocobalamin 1000 MCG tablet Take 1 tablet (1,000 mcg total) by mouth daily. 90 tablet 1   diclofenac Sodium (VOLTAREN ARTHRITIS PAIN) 1 % GEL Apply to left arm and shoulder twice daily as needed for pain 2 g 1   furosemide (LASIX) 40 MG tablet Take 1 tablet (40 mg total) by mouth daily. 90 tablet 3   glimepiride (AMARYL) 2 MG tablet Take 1 tablet (2 mg total) by mouth daily. 30 tablet 5   glucose blood (ACCU-CHEK GUIDE) test strip Use to check blood sugars 4 times a day   E11.65 400 each 3   hydrALAZINE (APRESOLINE) 100 MG tablet Take 1 tablet (100 mg total) by mouth 3 (three) times daily. 270 tablet 0   insulin glargine, 2 Unit Dial, (TOUJEO MAX SOLOSTAR) 300 UNIT/ML Solostar Pen  Inject 20 units in am 9 mL 3   Insulin Pen Needle (NOVOFINE PEN NEEDLE) 32G X 6 MM MISC Use as directed with toujeo pen 100 each 5   ipratropium-albuterol (DUONEB) 0.5-2.5 (3) MG/3ML SOLN Take 3 mLs by nebulization every 6 (six) hours as needed. 270 mL 0   iron polysaccharides (NIFEREX) 150 MG capsule Take 1 capsule (150 mg total) by mouth daily. 90 capsule 0   omeprazole (PRILOSEC) 40 MG capsule Take 1 capsule (40 mg total) by mouth daily. Take 1 capsule once in the morning and once in the evening 180 capsule 1   ondansetron (ZOFRAN-ODT) 4 MG disintegrating tablet Take 1 tablet (4 mg total) by mouth every 6 (six) hours as needed for nausea or vomiting. 20 tablet 0   polyethylene glycol (MIRALAX) 17 g packet Take 17 g by mouth daily. 14 each 0   potassium chloride SA (KLOR-CON M) 20 MEQ tablet Take 1 tablet (20 mEq total) by mouth 2 (two) times daily. 90 tablet 0   rosuvastatin (CRESTOR) 10 MG tablet Take 1 tablet (10 mg total) by mouth at bedtime. 90 tablet 1   Vitamin D, Ergocalciferol, (DRISDOL) 1.25 MG (50000 UNIT) CAPS capsule Take 1 capsule (50,000 Units total) by mouth every 7 (seven) days. 12 capsule 0   No current facility-administered medications for this visit.      PHYSICAL EXAM:  General:  Well appearing. No resp difficulty HEENT: normal Neck: supple. JVP flat. Carotids 2+ bilaterally; no bruits. No lymphadenopathy or thryomegaly appreciated. Cor: PMI normal. Regular rate & rhythm. No rubs, gallops or murmurs. Lungs: clear Abdomen: soft, nontender, nondistended. No hepatosplenomegaly. No bruits or masses. Good bowel sounds. Extremities: no cyanosis, clubbing, rash, edema Neuro: alert & orientedx3, cranial nerves grossly intact. Moves all 4 extremities w/o difficulty. Affect pleasant.   ECG:   ASSESSMENT & PLAN:  Chronic Heart failure with reduced ejection fraction- - NYHA II - euvolemic today - weighing daily; reminded to call for an overnight weight gain  of > 2  pounds or a weekly weight gain of > 5 pounds - weight down 5 pounds from last visit here 2 months ago - not adding salt to his food - participating in paramedicine program  - saw cardiology Mariah Milling) 02/09/22 - on GDMT coreg  - renal function may limit other GDMT; may need nephrology referral in the future - could consider jardiance if renal function remains stable - BNP 12/17/21 was 122.8 - has received his flu vaccine for this season  2. HTN with CKD- - BP looks good (134/83) - saw PCP (Abernathy) 03/29/22 - BMP 05/16/22 reviewed and showed Na 136, K 4.0, Cr 2.61 & gfr 26  3. DM-  - A1C 7.0% 08/06/21 - not checking his glucose at home because he says something is wrong with his meter; advised him to take the meter when he goes to his PCP and have them look at it; can also try to replace the batteries  4: OSA- - wears CPAP with O2 HS, for "all hours I sleep, including naps"   Medication bottles reviewed.   Return in 6 months, sooner if needed.

## 2022-11-22 NOTE — Telephone Encounter (Signed)
Patient did not show for his Heart Failure Clinic appointment on 11/22/22.

## 2022-11-23 DIAGNOSIS — L0231 Cutaneous abscess of buttock: Secondary | ICD-10-CM | POA: Diagnosis not present

## 2022-11-23 DIAGNOSIS — I509 Heart failure, unspecified: Secondary | ICD-10-CM | POA: Diagnosis not present

## 2022-11-23 DIAGNOSIS — I2510 Atherosclerotic heart disease of native coronary artery without angina pectoris: Secondary | ICD-10-CM | POA: Diagnosis not present

## 2022-11-23 DIAGNOSIS — E119 Type 2 diabetes mellitus without complications: Secondary | ICD-10-CM | POA: Diagnosis not present

## 2022-11-23 DIAGNOSIS — Z602 Problems related to living alone: Secondary | ICD-10-CM | POA: Diagnosis not present

## 2022-11-23 DIAGNOSIS — E785 Hyperlipidemia, unspecified: Secondary | ICD-10-CM | POA: Diagnosis not present

## 2022-11-23 DIAGNOSIS — I110 Hypertensive heart disease with heart failure: Secondary | ICD-10-CM | POA: Diagnosis not present

## 2022-11-23 DIAGNOSIS — J449 Chronic obstructive pulmonary disease, unspecified: Secondary | ICD-10-CM | POA: Diagnosis not present

## 2022-11-23 DIAGNOSIS — G4733 Obstructive sleep apnea (adult) (pediatric): Secondary | ICD-10-CM | POA: Diagnosis not present

## 2022-11-24 ENCOUNTER — Encounter: Payer: Medicare PPO | Admitting: Physician Assistant

## 2022-11-24 DIAGNOSIS — E1151 Type 2 diabetes mellitus with diabetic peripheral angiopathy without gangrene: Secondary | ICD-10-CM | POA: Diagnosis not present

## 2022-11-24 DIAGNOSIS — E11622 Type 2 diabetes mellitus with other skin ulcer: Secondary | ICD-10-CM | POA: Diagnosis not present

## 2022-11-24 DIAGNOSIS — L0231 Cutaneous abscess of buttock: Secondary | ICD-10-CM | POA: Diagnosis not present

## 2022-11-24 DIAGNOSIS — S61402A Unspecified open wound of left hand, initial encounter: Secondary | ICD-10-CM | POA: Diagnosis not present

## 2022-11-24 DIAGNOSIS — L98492 Non-pressure chronic ulcer of skin of other sites with fat layer exposed: Secondary | ICD-10-CM | POA: Diagnosis not present

## 2022-11-24 DIAGNOSIS — J449 Chronic obstructive pulmonary disease, unspecified: Secondary | ICD-10-CM | POA: Diagnosis not present

## 2022-11-24 DIAGNOSIS — I10 Essential (primary) hypertension: Secondary | ICD-10-CM | POA: Diagnosis not present

## 2022-11-24 DIAGNOSIS — I2510 Atherosclerotic heart disease of native coronary artery without angina pectoris: Secondary | ICD-10-CM | POA: Diagnosis not present

## 2022-11-24 DIAGNOSIS — S61211A Laceration without foreign body of left index finger without damage to nail, initial encounter: Secondary | ICD-10-CM | POA: Diagnosis not present

## 2022-11-25 DIAGNOSIS — I509 Heart failure, unspecified: Secondary | ICD-10-CM | POA: Diagnosis not present

## 2022-11-25 DIAGNOSIS — I2510 Atherosclerotic heart disease of native coronary artery without angina pectoris: Secondary | ICD-10-CM | POA: Diagnosis not present

## 2022-11-25 DIAGNOSIS — J449 Chronic obstructive pulmonary disease, unspecified: Secondary | ICD-10-CM | POA: Diagnosis not present

## 2022-11-25 DIAGNOSIS — G4733 Obstructive sleep apnea (adult) (pediatric): Secondary | ICD-10-CM | POA: Diagnosis not present

## 2022-11-25 DIAGNOSIS — L0231 Cutaneous abscess of buttock: Secondary | ICD-10-CM | POA: Diagnosis not present

## 2022-11-25 DIAGNOSIS — E119 Type 2 diabetes mellitus without complications: Secondary | ICD-10-CM | POA: Diagnosis not present

## 2022-11-25 DIAGNOSIS — I110 Hypertensive heart disease with heart failure: Secondary | ICD-10-CM | POA: Diagnosis not present

## 2022-11-25 DIAGNOSIS — Z602 Problems related to living alone: Secondary | ICD-10-CM | POA: Diagnosis not present

## 2022-11-25 DIAGNOSIS — E785 Hyperlipidemia, unspecified: Secondary | ICD-10-CM | POA: Diagnosis not present

## 2022-11-25 NOTE — Progress Notes (Signed)
THURL, KRISTIANSEN ([id number]) [id number] Page 1 of 9 Visit Report for 11/24/2022 Chief Complaint Document Details Patient Name: Date of Service: Larry Link MES F. 11/24/2022 9:00 A M Medical Record Number: [id number] Patient Account Number: [id number] Date of Birth/Sex: Treating RN: March 09, 1956 (67 y.o. Larry Callahan Primary Care Provider: Sallyanne Kuster Other Clinician: Referring Provider: Treating Provider/Extender: Weldon Inches Weeks in Treatment: 14 Information Obtained from: Patient Chief Complaint Right gluteal abscess and Left 2nd finger ulcer Electronic Signature(s) Signed: 11/24/2022 9:11:30 AM By: Allen Derry PA-C Entered By: Allen Derry on 11/24/2022 09:11:30 -------------------------------------------------------------------------------- Debridement Details Patient Name: Date of Service: Larry Link MES F. 11/24/2022 9:00 A M Medical Record Number: [id number] Patient Account Number: [id number] Date of Birth/Sex: Treating RN: 1956-04-10 (67 y.o. Larry Callahan Primary Care Provider: Sallyanne Kuster Other Clinician: Referring Provider: Treating Provider/Extender: Weldon Inches Weeks in Treatment: 14 Debridement Performed for Assessment: Wound #3 Left Hand - 2nd Digit Performed By: Physician Allen Derry, PA-C Debridement Type: Debridement Level of Consciousness (Pre-procedure): Awake and Alert Pre-procedure Verification/Time Out Yes - 10:03 Taken: Start Time: 10:03 Percent of Wound Bed Debrided: 10% T Area Debrided (cm): otal 0.22 Tissue and other material debrided: Viable, Non-Viable, Slough, Subcutaneous, Slough Level: Skin/Subcutaneous Tissue Debridement Description: Excisional Instrument: Forceps, Scissors Bleeding: Minimum Hemostasis Achieved: Pressure Response to Treatment: Procedure was tolerated well Level of Consciousness (Post- Awake and Alert procedure): Larry Callahan, Larry Callahan  ([id number]) [id number] Page 2 of 9 Post Debridement Measurements of Total Wound Length: (cm) 1.4 Width: (cm) 2 Depth: (cm) 0.2 Volume: (cm) 0.44 Character of Wound/Ulcer Post Debridement: Improved Post Procedure Diagnosis Same as Pre-procedure Electronic Signature(s) Signed: 11/24/2022 4:30:05 PM By: Angelina Pih Signed: 11/24/2022 5:43:48 PM By: Allen Derry PA-C Signed: 11/25/2022 1:24:35 PM By: Betha Loa Entered By: Betha Loa on 11/24/2022 10:04:05 -------------------------------------------------------------------------------- HPI Details Patient Name: Date of Service: Larry Callahan, Larry MES F. 11/24/2022 9:00 A M Medical Record Number: [id number] Patient Account Number: [id number] Date of Birth/Sex: Treating RN: 12-22-1955 (67 y.o. Larry Callahan Primary Care Provider: Sallyanne Kuster Other Clinician: Referring Provider: Treating Provider/Extender: Weldon Inches Weeks in Treatment: 14 History of Present Illness HPI Description: ADMISSION 08/15/2022 This is a 67 year old man who came in with very little medical information. There was some suggestion that he was in the hospital last month with cellulitis of the arm I will need to check this out thoroughly. He tells me that he received 2 courses of antibiotics last month were not really sure which ones they were but they are completed now. He is not really having any discomfort. His sister who lives nearby is dressing this with what sounds like Coban. I looked through [facility] health link. He was in the hospital about 3 weeks ago for an infection of the right elbow. Prior to that he had been in an urgent care receiving Keflex. I do not think he was admitted to hospital but may have spent some time in the ER. I do not see any cultures. 3/11; patient presents for follow-up. He has been using Hydrofera Blue to the right elbow. The wound has healed. Readmission: 09-01-2022 upon evaluation  today patient presents for reevaluation here in the clinic he actually was previously seen for his elbow today he comes in with the elbow okay but unfortunately a right gluteal abscess. Unfortunately he tells me that this is somewhat painful. He was seen in the ER they gave him doxycycline he has not picked that up yet that  was just yesterday. 09-08-2022 upon evaluation today patient's wound actually appears to be excellent. In fact he called in stating that was bleeding profusely have a note in communications concerning that. Nonetheless the home health nurse and reassured me he was doing okay and this appears to be doing just fine today as well. I think it is actually showing less pressure compared to last time I saw him as far as the overall surrounding tissue and how things appear. 09-15-2022 upon evaluation today patient's wound unfortunately appears to be infected. I do believe that he is going to require intervention as far as antibiotics are concerned I am actually going to grab a wound culture today as well. 09-22-2022 upon evaluation today patient appears to be doing better in regard to his wound and actually very pleased with where things stand. Fortunately I do think that he is moving in the right direction. No fevers, chills, nausea, vomiting, or diarrhea. 09-30-2022 upon evaluation today patient appears to be doing well currently in regard to his wound. This is actually showing signs of improvement which is great news. I am very pleased with where we stand. I do not see any evidence of active infection locally nor systemically which is great news. The patient in general tells me that he is still having some discomfort but this is not nearly as much as it was in the beginning. Overall I do believe that we are moving in the right direction. 10-07-2022 upon evaluation today patient appears to be doing well currently in regard to his wound which is showing signs of improvement. Fortunately I do  not see any evidence of active infection locally nor systemically which is great news. No fevers, chills, nausea, vomiting, or diarrhea. 10-17-2022 upon evaluation today patient appears to be doing well currently in regard to his wound though he continues to have fecal material in and around the dressing and wound. This is not good for the healing and may in fact be slowing things down quite a bit. ELWAY, MCCULLUM ([id number]) [id number] Page 3 of 9 10-25-2022 upon evaluation today patient's wound actually appears to be making progress which is good news. Fortunately I do not see any signs of infection and the wound is measuring smaller. I do not feel there is any evidence of active infection locally nor systemically at this point. 11-01-2022 upon evaluation today patient appears to be doing well currently in regard to his wound which is actually showing signs of improvement. Fortunately there does not appear to be any evidence of active infection locally nor systemically which is great news. No fevers, chills, nausea, vomiting, or diarrhea. 11-10-2022 upon evaluation today patient appears to be doing well currently in regard to his wound in the gluteal region which is making progress here. Fortunately I do not see any signs of infection he is measuring smaller and again I felt removing in the right way here. In regard to his finger this is a new injury that he cannot even tell me how he injured this. This is of concern for sure but again I am not exactly sure the etiology behind what happened he feels he may have "hit it on something" nonetheless I really do not know although it does not look bruised. We have gotten a note from the home health nurse stating that he had shut it in a door. Nonetheless the patient actually denies this. I really do not know what happened here. 11-17-2022 upon evaluation today patient appears to be doing  well currently in regard to his wounds.  Fortunately there does not appear to be any signs of active infection at this time. The finger is looking better and the wound in the gluteal region is also doing better. In general I think that we will make an some good progress here. 11-24-2022 upon evaluation today patient appears to be doing well with regard to the wound in the gluteal region. This is on the right. With that being said he does require some debridement regards to the finger there is a little bit of necrotic tissue with an x-ray of that away. Electronic Signature(s) Signed: 11/24/2022 5:28:32 PM By: Allen Derry PA-C Entered By: Allen Derry on 11/24/2022 17:28:32 -------------------------------------------------------------------------------- Physical Exam Details Patient Name: Date of Service: Larry Link MES F. 11/24/2022 9:00 A M Medical Record Number: [id number] Patient Account Number: [id number] Date of Birth/Sex: Treating RN: [birth year] (67 y.o. Larry Callahan Primary Care Provider: Sallyanne Kuster Other Clinician: Referring Provider: Treating Provider/Extender: Weldon Inches Weeks in Treatment: 14 Constitutional Well-nourished and well-hydrated in no acute distress. Respiratory normal breathing without difficulty. Psychiatric this patient is able to make decisions and demonstrates good insight into disease process. Alert and Oriented x 3. pleasant and cooperative. Notes I did perform debridement clearway necrotic tissue in regard to the finger he tolerated that debridement today without complication and postdebridement wound bed appears to be doing much better which is great news. In regard to his wound right gluteal region. By the [employer] dressing he is having a lot of trouble elfa keeping this clean. I think the better way to go would be to use the [facility] and then subsequently a T egaderm over top in order to secure and keep this from getting dirty. Obviously if he gets feces in his  dressing this is not good this should help prevent that. Electronic Signature(s) Signed: 11/24/2022 5:29:18 PM By: Allen Derry PA-C Entered By: Allen Derry on 11/24/2022 17:29:18 Larry Callahan ([id number]) [id number] Page 4 of 9 -------------------------------------------------------------------------------- Physician Orders Details Patient Name: Date of Service: Larry Link MES F. 11/24/2022 9:00 A M Medical Record Number: [id number] Patient Account Number: [id number] Date of Birth/Sex: Treating RN: 12-04-1955 (67 y.o. Larry Callahan Primary Care Provider: Sallyanne Kuster Other Clinician: Referring Provider: Treating Provider/Extender: Weldon Inches Weeks in Treatment: 45 Verbal / Phone Orders: Yes Clinician: Angelina Pih Read Back and Verified: Yes Diagnosis Coding ICD-10 Coding Code Description L02.31 Cutaneous abscess of buttock L98.492 Non-pressure chronic ulcer of skin of other sites with fat layer exposed S61.211A Laceration without foreign body of left index finger without damage to nail, initial encounter Follow-up Appointments Return Appointment in 1 week. Home Health [facility] for wound care. May utilize formulary equivalent dressing for wound treatment orders unless otherwise specified. Home Health Nurse may visit PRN to address patients wound care needs. Rolene Arbour Fax: [phone] Anesthetic (Use 'Patient Medications' Section for Anesthetic Order Entry) Lidocaine applied to wound bed Edema Control - Lymphedema / Segmental Compressive Device / Other Elevate, Exercise Daily and A void Standing for Long Periods of Time. Elevate legs to the level of the heart and pump ankles as often as possible Elevate leg(s) parallel to the floor when sitting. Wound Treatment Wound #2 - Gluteus Wound Laterality: Right Cleanser: Soap and Water 3 x Per Week/15 Days Discharge Instructions: Gently cleanse wound with  antibacterial soap, rinse and pat dry prior to dressing wounds Prim Dressing: Hydrofera Blue Ready Transfer Foam, 2.5x2.5 (in/in) (  Generic) 3 x Per Week/15 Days ary Discharge Instructions: Apply Hydrofera Blue Ready to wound bed as directed Secured With: Tegaderm Film 4x4 (in/in) 3 x Per Week/15 Days Discharge Instructions: Apply to wound bed Wound #3 - Hand - 2nd Digit Wound Laterality: Left Cleanser: Soap and Water 3 x Per Week/15 Days Discharge Instructions: Gently cleanse wound with antibacterial soap, rinse and pat dry prior to dressing wounds Prim Dressing: Xeroform-HBD 2x2 (in/in) 3 x Per Week/15 Days ary Discharge Instructions: Apply Xeroform-HBD 2x2 (in/in) as directed Secondary Dressing: Gauze 3 x Per Week/15 Days Discharge Instructions: As directed: dry, moistened with saline or moistened with Dakins Solution Secured With: [employer] Dressing, Latex-free, Size 5, Small-Head / Shoulder / Thigh 3 x Per Week/15 Days Discharge Instructions: use size 3 stretch net Electronic Signature(s) Signed: 11/24/2022 5:43:48 PM By: Allen Derry PA-C Signed: 11/25/2022 1:24:35 PM By: Betha Loa Entered By: Betha Loa on 11/24/2022 10:49:32 Larry Callahan ([id number]) [id number] Page 5 of 9 Prescription 11/24/2022 -------------------------------------------------------------------------------- Larry Mort PA-C Patient Name: Provider: Nov 06, 1955 [id number] Date of Birth: NPI#: [name] [id number] Sex: DEA #: [phone] Phone #: License #: UPN: Patient Address: [address] [facility] [place], [place] [address] [facility] [address], [address] [place], [place] [address] [phone] Allergies penicillin Provider's Orders CONTINUE Home Health for wound care. May utilize formulary equivalent dressing for wound treatment orders unless otherwise specified. Home Health  Nurse may visit PRN to address patients wound care needs. Rolene Arbour Fax: [phone] Hand Signature: Date(s): Electronic Signature(s) Signed: 11/24/2022 5:43:48 PM By: Allen Derry PA-C Signed: 11/25/2022 1:24:35 PM By: Betha Loa Entered By: Betha Loa on 11/24/2022 10:49:32 -------------------------------------------------------------------------------- Problem List Details Patient Name: Date of Service: Larry Link MES F. 11/24/2022 9:00 A M Medical Record Number: [id number] Patient Account Number: [id number] Date of Birth/Sex: Treating RN: 08/20/1955 (67 y.o. Larry Callahan Primary Care Provider: Sallyanne Kuster Other Clinician: Referring Provider: Treating Provider/Extender: Weldon Inches Weeks in Treatment: 14 Active Problems ICD-10 Encounter Code Description Active Date MDM Diagnosis L02.31 Cutaneous abscess of buttock 08/15/2022 No Yes Larry Callahan, Larry Callahan ([id number]) [id number] Page 6 of 9 L98.492 Non-pressure chronic ulcer of skin of other sites with fat layer exposed 11/10/2022 No Yes S61.211A Laceration without foreign body of left index finger without damage to nail, 11/10/2022 No Yes initial encounter Inactive Problems Resolved Problems Electronic Signature(s) Signed: 11/24/2022 9:11:27 AM By: Allen Derry PA-C Entered By: Allen Derry on 11/24/2022 09:11:26 -------------------------------------------------------------------------------- Progress Note Details Patient Name: Date of Service: Larry Link MES F. 11/24/2022 9:00 A M Medical Record Number: [id number] Patient Account Number: [id number] Date of Birth/Sex: Treating RN: 01/26/1956 (67 y.o. Larry Callahan Primary Care Provider: Sallyanne Kuster Other Clinician: Referring Provider: Treating Provider/Extender: Weldon Inches Weeks in Treatment: 14 Subjective Chief Complaint Information obtained from Patient Right gluteal abscess and Left  2nd finger ulcer History of Present Illness (HPI) ADMISSION 08/15/2022 This is a 67 year old man who came in with very little medical information. There was some suggestion that he was in the hospital last month with cellulitis of the arm I will need to check this out thoroughly. He tells me that he received 2 courses of antibiotics last month were not really sure which ones they were but they are completed now. He is not really having any discomfort. His sister who lives nearby is dressing this with what sounds like Coban. I looked through [facility] health  link. He was in the hospital about 3 weeks ago for an infection of the right elbow. Prior to that he had been in an urgent care receiving Keflex. I do not think he was admitted to hospital but may have spent some time in the ER. I do not see any cultures. 3/11; patient presents for follow-up. He has been using Hydrofera Blue to the right elbow. The wound has healed. Readmission: 09-01-2022 upon evaluation today patient presents for reevaluation here in the clinic he actually was previously seen for his elbow today he comes in with the elbow okay but unfortunately a right gluteal abscess. Unfortunately he tells me that this is somewhat painful. He was seen in the ER they gave him doxycycline he has not picked that up yet that was just yesterday. 09-08-2022 upon evaluation today patient's wound actually appears to be excellent. In fact he called in stating that was bleeding profusely have a note in communications concerning that. Nonetheless the home health nurse and reassured me he was doing okay and this appears to be doing just fine today as well. I think it is actually showing less pressure compared to last time I saw him as far as the overall surrounding tissue and how things appear. 09-15-2022 upon evaluation today patient's wound unfortunately appears to be infected. I do believe that he is going to require intervention as far as antibiotics are  concerned I am actually going to grab a wound culture today as well. 09-22-2022 upon evaluation today patient appears to be doing better in regard to his wound and actually very pleased with where things stand. Fortunately I do think that he is moving in the right direction. No fevers, chills, nausea, vomiting, or diarrhea. 09-30-2022 upon evaluation today patient appears to be doing well currently in regard to his wound. This is actually showing signs of improvement which is great news. I am very pleased with where we stand. I do not see any evidence of active infection locally nor systemically which is great news. The patient in general Larry Callahan, Larry Callahan ([id number]) [id number] Page 7 of 9 tells me that he is still having some discomfort but this is not nearly as much as it was in the beginning. Overall I do believe that we are moving in the right direction. 10-07-2022 upon evaluation today patient appears to be doing well currently in regard to his wound which is showing signs of improvement. Fortunately I do not see any evidence of active infection locally nor systemically which is great news. No fevers, chills, nausea, vomiting, or diarrhea. 10-17-2022 upon evaluation today patient appears to be doing well currently in regard to his wound though he continues to have fecal material in and around the dressing and wound. This is not good for the healing and may in fact be slowing things down quite a bit. 10-25-2022 upon evaluation today patient's wound actually appears to be making progress which is good news. Fortunately I do not see any signs of infection and the wound is measuring smaller. I do not feel there is any evidence of active infection locally nor systemically at this point. 11-01-2022 upon evaluation today patient appears to be doing well currently in regard to his wound which is actually showing signs of improvement. Fortunately there does not appear to be any  evidence of active infection locally nor systemically which is great news. No fevers, chills, nausea, vomiting, or diarrhea. 11-10-2022 upon evaluation today patient appears to be doing well currently in regard to  his wound in the gluteal region which is making progress here. Fortunately I do not see any signs of infection he is measuring smaller and again I felt removing in the right way here. In regard to his finger this is a new injury that he cannot even tell me how he injured this. This is of concern for sure but again I am not exactly sure the etiology behind what happened he feels he may have "hit it on something" nonetheless I really do not know although it does not look bruised. We have gotten a note from the home health nurse stating that he had shut it in a door. Nonetheless the patient actually denies this. I really do not know what happened here. 11-17-2022 upon evaluation today patient appears to be doing well currently in regard to his wounds. Fortunately there does not appear to be any signs of active infection at this time. The finger is looking better and the wound in the gluteal region is also doing better. In general I think that we will make an some good progress here. 11-24-2022 upon evaluation today patient appears to be doing well with regard to the wound in the gluteal region. This is on the right. With that being said he does require some debridement regards to the finger there is a little bit of necrotic tissue with an x-ray of that away. Objective Constitutional Well-nourished and well-hydrated in no acute distress. Vitals Time Taken: 8:58 AM, Height: 71 in, Weight: 229 lbs, BMI: 31.9, Temperature: 97.7 F, Pulse: 66 bpm, Respiratory Rate: 18 breaths/min, Blood Pressure: 147/88 mmHg. Respiratory normal breathing without difficulty. Psychiatric this patient is able to make decisions and demonstrates good insight into disease process. Alert and Oriented x 3. pleasant and  cooperative. General Notes: I did perform debridement clearway necrotic tissue in regard to the finger he tolerated that debridement today without complication and postdebridement wound bed appears to be doing much better which is great news. In regard to his wound right gluteal region. By the [employer] dressing he is elfa having a lot of trouble keeping this clean. I think the better way to go would be to use the [facility] and then subsequently a T egaderm over top in order to secure and keep this from getting dirty. Obviously if he gets feces in his dressing this is not good this should help prevent that. Integumentary (Hair, Skin) Wound #2 status is Open. Original cause of wound was Gradually Appeared. The date acquired was: 08/29/2022. The wound has been in treatment 12 weeks. The wound is located on the Right Gluteus. The wound measures 1cm length x 0.8cm width x 0.2cm depth; 0.628cm^2 area and 0.126cm^3 volume. There is Fat Layer (Subcutaneous Tissue) exposed. There is a medium amount of serosanguineous drainage noted. There is large (67-100%) red granulation within the wound bed. There is no necrotic tissue within the wound bed. Wound #3 status is Open. Original cause of wound was Blister. The date acquired was: 11/07/2022. The wound has been in treatment 2 weeks. The wound is located on the Left Hand - 2nd Digit. The wound measures 1.4cm length x 2cm width x 0.2cm depth; 2.199cm^2 area and 0.44cm^3 volume. There is Fat Layer (Subcutaneous Tissue) exposed. There is a medium amount of serosanguineous drainage noted. There is medium (34-66%) pink, pale granulation within the wound bed. Assessment Active Problems ICD-10 Cutaneous abscess of buttock Non-pressure chronic ulcer of skin of other sites with fat layer exposed Laceration without foreign body of  left index finger without damage to nail, initial encounter Procedures Wound #3 Larry Callahan, Larry Callahan ([id number])  [id number] Page 8 of 9 Pre-procedure diagnosis of Wound #3 is a Trauma, Other located on the Left Hand - 2nd Digit . There was a Excisional Skin/Subcutaneous Tissue Debridement with a total area of 0.22 sq cm performed by Allen Derry, PA-C. With the following instrument(s): Forceps, and Scissors to remove Viable and Non-Viable tissue/material. Material removed includes Subcutaneous Tissue and Slough and. A time out was conducted at 10:03, prior to the start of the procedure. A Minimum amount of bleeding was controlled with Pressure. The procedure was tolerated well. Post Debridement Measurements: 1.4cm length x 2cm width x 0.2cm depth; 0.44cm^3 volume. Character of Wound/Ulcer Post Debridement is improved. Post procedure Diagnosis Wound #3: Same as Pre-Procedure Plan Follow-up Appointments: Return Appointment in 1 week. Home Health: [facility] for wound care. May utilize formulary equivalent dressing for wound treatment orders unless otherwise specified. Home Health Nurse may visit PRN to address patients wound care needs. Rolene Arbour Fax: [phone] Anesthetic (Use 'Patient Medications' Section for Anesthetic Order Entry): Lidocaine applied to wound bed Edema Control - Lymphedema / Segmental Compressive Device / Other: Elevate, Exercise Daily and Avoid Standing for Long Periods of Time. Elevate legs to the level of the heart and pump ankles as often as possible Elevate leg(s) parallel to the floor when sitting. WOUND #2: - Gluteus Wound Laterality: Right Cleanser: Soap and Water 3 x Per Week/15 Days Discharge Instructions: Gently cleanse wound with antibacterial soap, rinse and pat dry prior to dressing wounds Prim Dressing: Hydrofera Blue Ready Transfer Foam, 2.5x2.5 (in/in) (Generic) 3 x Per Week/15 Days ary Discharge Instructions: Apply Hydrofera Blue Ready to wound bed as directed Secured With: T egaderm Film 4x4 (in/in) 3 x Per Week/15  Days Discharge Instructions: Apply to wound bed WOUND #3: - Hand - 2nd Digit Wound Laterality: Left Cleanser: Soap and Water 3 x Per Week/15 Days Discharge Instructions: Gently cleanse wound with antibacterial soap, rinse and pat dry prior to dressing wounds Prim Dressing: Xeroform-HBD 2x2 (in/in) 3 x Per Week/15 Days ary Discharge Instructions: Apply Xeroform-HBD 2x2 (in/in) as directed Secondary Dressing: Gauze 3 x Per Week/15 Days Discharge Instructions: As directed: dry, moistened with saline or moistened with Dakins Solution Secured With: D[employer] Food Dressing, Latex-free, Size 5, Small-Head / Shoulder / Thigh 3 x Per Week/15 Days Discharge Instructions: use size 3 stretch net 1. I am good recommend based on what we are seeing that we going continue with the [facility] I think that is doing well both locations. 2. I am also going to recommend the patient should use T egaderm instead of the T island dressing for the gluteal region this should be much better because elfa it will actually be waterproof and allow him to clean the area after a bowel movement without getting the feces on his dressing. We will see patient back for reevaluation in 1 week here in the clinic. If anything worsens or changes patient will contact our office for additional recommendations. Electronic Signature(s) Signed: 11/24/2022 5:29:43 PM By: Allen Derry PA-C Entered By: Allen Derry on 11/24/2022 17:29:43 -------------------------------------------------------------------------------- SuperBill Details Patient Name: Date of Service: Larry Callahan, Larry MES F. 11/24/2022 Medical Record Number: [id number] Patient Account Number: [id number] Date of Birth/Sex: Treating RN: August 31, 1955 (67 y.o. Larry Callahan Primary Care Provider: Sallyanne Kuster Other Clinician: Referring Provider: Treating Provider/Extender: Weldon Inches Weeks in Treatment: [address] Larry Callahan, Larry F ([id number])  [id number] Page 9 of 9 ICD-10 Codes Code Description L02.31 Cutaneous abscess of buttock L98.492 Non-pressure chronic ulcer of skin of other sites with fat layer exposed S61.211A Laceration without foreign body of left index finger without damage to nail, initial encounter Facility Procedures : CPT4 Code: 16109604 Description: 11042 - DEB SUBQ TISSUE 20 SQ CM/< ICD-10 Diagnosis Description L98.492 Non-pressure chronic ulcer of skin of other sites with fat layer exposed Modifier: Quantity: 1 Physician Procedures : CPT4 Code Description Modifier [id number] 11042 - WC PHYS SUBQ TISS 20 SQ CM ICD-10 Diagnosis Description L98.492 Non-pressure chronic ulcer of skin of other sites with fat layer exposed Quantity: 1 Electronic Signature(s) Signed: 11/24/2022 5:31:48 PM By: Allen Derry PA-C Entered By: Allen Derry on 11/24/2022 17:31:48

## 2022-11-26 NOTE — Progress Notes (Signed)
Larry Callahan, Larry Callahan ([id number]) [id number] Page 1 of 9 Visit Report for 11/24/2022 Arrival Information Details Patient Name: Date of Service: Larry Callahan MES Callahan. 11/24/2022 9:00 A M Medical Record Number: [id number] Patient Account Number: [id number] Date of Birth/Sex: Treating RN: Aug 13, 1955 (67 y.o. Larry Callahan Primary Care Sonia Stickels: Sallyanne Kuster Other Clinician: Referring Olubunmi Rothenberger: Treating Keyarah Mcroy/Extender: Weldon Inches Weeks in Treatment: 14 Visit Information History Since Last Visit All ordered tests and consults were completed: No Patient Arrived: Ambulatory Added or deleted any medications: No Arrival Time: 08:55 Any new allergies or adverse reactions: No Transfer Assistance: None Had a fall or experienced change in No Patient Identification Verified: Yes activities of daily living that may affect Secondary Verification Process Completed: Yes risk of falls: Patient Requires Transmission-Based Precautions: No Signs or symptoms of abuse/neglect since last visito No Patient Has Alerts: Yes Hospitalized since last visit: No Patient Alerts: Patient on Blood Thinner Implantable device outside of the clinic excluding No cellular tissue based products placed in the center since last visit: Has Dressing in Place as Prescribed: Yes Pain Present Now: No Electronic Signature(s) Signed: 11/25/2022 1:24:35 PM By: Betha Loa Entered By: Betha Loa on 11/24/2022 08:58:11 -------------------------------------------------------------------------------- Clinic Level of Care Assessment Details Patient Name: Date of Service: Larry Callahan MES Callahan. 11/24/2022 9:00 A M Medical Record Number: [id number] Patient Account Number: [id number] Date of Birth/Sex: Treating RN: 1955-07-24 (67 y.o. Larry Callahan Primary Care Yuniel Blaney: Sallyanne Kuster Other Clinician: Referring Jaylianna Tatlock: Treating Ashlin Hidalgo/Extender: Weldon Inches Weeks in Treatment: 14 Clinic Level of Care Assessment Items TOOL 1 Quantity Score []  - 0 Use when EandM and Procedure is performed on INITIAL visit ASSESSMENTS - Nursing Assessment / Reassessment []  - 0 General Physical Exam (combine w/ comprehensive assessment (listed just below) when performed on new pt. evals) []  - 0 Comprehensive Assessment (HX, ROS, Risk Assessments, Wounds Hx, etc.) Larry Callahan, Larry Callahan ([id number]) [id number] Page 2 of 9 ASSESSMENTS - Wound and Skin Assessment / Reassessment []  - 0 Dermatologic / Skin Assessment (not related to wound area) ASSESSMENTS - Ostomy and/or Continence Assessment and Care []  - 0 Incontinence Assessment and Management []  - 0 Ostomy Care Assessment and Management (repouching, etc.) PROCESS - Coordination of Care []  - 0 Simple Patient / Family Education for ongoing care []  - 0 Complex (extensive) Patient / Family Education for ongoing care []  - 0 Staff obtains Chiropractor, Records, T Results / Process Orders est []  - 0 Staff telephones HHA, Nursing Homes / Clarify orders / etc []  - 0 Routine Transfer to another Facility (non-emergent condition) []  - 0 Routine Hospital Admission (non-emergent condition) []  - 0 New Admissions / Manufacturing engineer / Ordering NPWT Apligraf, etc. , []  - 0 Emergency Hospital Admission (emergent condition) PROCESS - Special Needs []  - 0 Pediatric / Minor Patient Management []  - 0 Isolation Patient Management []  - 0 Hearing / Language / Visual special needs []  - 0 Assessment of Community assistance (transportation, D/C planning, etc.) []  - 0 Additional assistance / Altered mentation []  - 0 Support Surface(s) Assessment (bed, cushion, seat, etc.) INTERVENTIONS - Miscellaneous []  - 0 External ear exam []  - 0 Patient Transfer (multiple staff / Nurse, adult / Similar devices) []  - 0 Simple Staple / Suture removal (25 or less) []  - 0 Complex Staple / Suture  removal (26 or more) []  - 0 Hypo/Hyperglycemic Management (do not check if billed separately) []  - 0 Ankle / Brachial Index (ABI) - do not check if  billed separately Has the patient been seen at the hospital within the last three years: Yes Total Score: 0 Level Of Care: ____ Electronic Signature(s) Signed: 11/25/2022 1:24:35 PM By: Betha Loa Entered By: Betha Loa on 11/24/2022 10:07:06 -------------------------------------------------------------------------------- Encounter Discharge Information Details Patient Name: Date of Service: Larry Callahan MES Callahan. 11/24/2022 9:00 A M Medical Record Number: [id number] Patient Account Number: [id number] Date of Birth/Sex: Treating RN: Sep 26, 1955 (67 y.o. Larry Callahan Primary Care Kashis Penley: Sallyanne Kuster Other Clinician: Referring Kingsly Kloepfer: Treating Nachman Sundt/Extender: Weldon Inches [facility] ([id number]) [id number] Page 3 of 9 Weeks in Treatment: 14 Encounter Discharge Information Items Post Procedure Vitals Discharge Condition: Stable Temperature (Callahan): 97.7 Ambulatory Status: Ambulatory Pulse (bpm): 66 Discharge Destination: Home Respiratory Rate (breaths/min): 18 Transportation: Private Auto Blood Pressure (mmHg): 147/88 Accompanied By: self Schedule Follow-up Appointment: Yes Clinical Summary of Care: Electronic Signature(s) Signed: 11/25/2022 1:24:35 PM By: Betha Loa Entered By: Betha Loa on 11/24/2022 10:17:31 -------------------------------------------------------------------------------- Lower Extremity Assessment Details Patient Name: Date of Service: Larry Callahan MES Callahan. 11/24/2022 9:00 A M Medical Record Number: [id number] Patient Account Number: [id number] Date of Birth/Sex: Treating RN: Apr 02, 1956 (67 y.o. Larry Callahan Primary Care Newman Waren: Sallyanne Kuster Other Clinician: Referring Lenell Lama: Treating Tonette Koehne/Extender: Weldon Inches Weeks in Treatment: 14 Electronic Signature(s) Signed: 11/24/2022 4:30:05 PM By: Angelina Pih Signed: 11/25/2022 1:24:35 PM By: Betha Loa Entered By: Betha Loa on 11/24/2022 09:10:21 -------------------------------------------------------------------------------- Multi Wound Chart Details Patient Name: Date of Service: Larry Callahan MES Callahan. 11/24/2022 9:00 A M Medical Record Number: [id number] Patient Account Number: [id number] Date of Birth/Sex: Treating RN: Sep 04, 1955 (67 y.o. Larry Callahan Primary Care Dalana Pfahler: Sallyanne Kuster Other Clinician: Referring Norissa Bartee: Treating Naiomi Musto/Extender: Weldon Inches Weeks in Treatment: 14 Vital Signs Height(in): 71 Pulse(bpm): 66 Weight(lbs): 229 Blood Pressure(mmHg): 147/88 Body Mass Index(BMI): 31.9 Temperature(Callahan): 97.7 Respiratory Rate(breaths/min): 18 [Larry Callahan, Larry Callahan ([id number]):Photos:] [phone].pdf Page 4 of 9:2 3 N/A N/A] Right Gluteus Left Hand - 2nd Digit N/A Wound Location: Gradually Appeared Blister N/A Wounding Event: Abscess Trauma, Other N/A Primary Etiology: Chronic Obstructive Pulmonary Chronic Obstructive Pulmonary N/A Comorbid History: Disease (COPD), Arrhythmia, Disease (COPD), Arrhythmia, Coronary Artery Disease, Coronary Artery Disease, Hypertension, Type II Diabetes Hypertension, Type II Diabetes 08/29/2022 11/07/2022 N/A Date Acquired: 12 2 N/A Weeks of Treatment: Open Open N/A Wound Status: No No N/A Wound Recurrence: 1x0.8x0.2 1.4x2x0.2 N/A Measurements L x W x D (cm) 0.628 2.199 N/A A (cm) : rea 0.126 0.44 N/A Volume (cm) : 90.90% -47.40% N/A % Reduction in Area: 93.90% -195.30% N/A % Reduction in Volume: Full Thickness Without Exposed Full Thickness Without Exposed N/A Classification: Support Structures Support Structures Medium Medium N/A Exudate Amount: Serosanguineous Serosanguineous N/A Exudate Type: red, brown red,  brown N/A Exudate Color: Large (67-100%) Medium (34-66%) N/A Granulation Amount: Red Pink, Pale N/A Granulation Quality: None Present (0%) N/A N/A Necrotic Amount: Fat Layer (Subcutaneous Tissue): Yes Fat Layer (Subcutaneous Tissue): Yes N/A Exposed Structures: Fascia: No Fascia: No Tendon: No Tendon: No Muscle: No Muscle: No Joint: No Joint: No Bone: No Bone: No None None N/A Epithelialization: Treatment Notes Electronic Signature(s) Signed: 11/25/2022 1:24:35 PM By: Betha Loa Entered By: Betha Loa on 11/24/2022 09:11:16 -------------------------------------------------------------------------------- Multi-Disciplinary Care Plan Details Patient Name: Date of Service: Larry Callahan MES Callahan. 11/24/2022 9:00 A M Medical Record Number: [id number] Patient Account Number: [id number] Date of Birth/Sex: Treating RN: 1956-01-18 (67 y.o. Larry Callahan Primary Care Delila Kuklinski: Sallyanne Kuster Other Clinician: Referring Hikari Tripp: Treating Nyxon Strupp/Extender:  Stone, Leandro Reasoner Weeks in Treatment: [address] Inactive Necrotic Tissue DIYARI, CAMINERO ([id number]) [id number] Page 5 of 9 Nursing Diagnoses: Knowledge deficit related to management of necrotic/devitalized tissue Goals: Necrotic/devitalized tissue will be minimized in the wound bed Date Initiated: 09/01/2022 Target Resolution Date: 11/01/2022 Goal Status: Active Interventions: Assess patient pain level pre-, during and post procedure and prior to discharge Provide education on necrotic tissue and debridement process Notes: Electronic Signature(s) Signed: 11/24/2022 4:30:05 PM By: Angelina Pih Signed: 11/25/2022 1:24:35 PM By: Betha Loa Entered By: Betha Loa on 11/24/2022 10:16:15 -------------------------------------------------------------------------------- Pain Assessment Details Patient Name: Date of Service: Larry Callahan MES Callahan. 11/24/2022 9:00 A  M Medical Record Number: [id number] Patient Account Number: [id number] Date of Birth/Sex: Treating RN: [birth year] (67 y.o. Larry Callahan Primary Care Rashad Obeid: Sallyanne Kuster Other Clinician: Referring Quantarius Genrich: Treating Shareece Bultman/Extender: Weldon Inches Weeks in Treatment: 14 Active Problems Location of Pain Severity and Description of Pain Patient Has Paino No Site Locations Pain Management and Medication Current Pain Management: Electronic Signature(s) Signed: 11/24/2022 4:30:05 PM By: Angelina Pih Signed: 11/25/2022 1:24:35 PM By: Betha Loa Entered By: Betha Loa on 11/24/2022 09:00:33 Larry Callahan ([id number]) [id number] Page 6 of 9 -------------------------------------------------------------------------------- Patient/Caregiver Education Details Patient Name: Date of Service: Larry Callahan MES Callahan. 6/13/2024andnbsp9:00 A M Medical Record Number: [id number] Patient Account Number: [id number] Date of Birth/Gender: Treating RN: 1955/08/28 (67 y.o. Larry Callahan Primary Care Physician: Sallyanne Kuster Other Clinician: Referring Physician: Treating Physician/Extender: Weldon Inches Weeks in Treatment: 14 Education Assessment Education Provided To: Patient Education Topics Provided Wound/Skin Impairment: Handouts: Other: continue wound care as directed Methods: Explain/Verbal Responses: State content correctly Electronic Signature(s) Signed: 11/25/2022 1:24:35 PM By: Betha Loa Entered By: Betha Loa on 11/24/2022 10:16:35 -------------------------------------------------------------------------------- Wound Assessment Details Patient Name: Date of Service: Larry Callahan MES Callahan. 11/24/2022 9:00 A M Medical Record Number: [id number] Patient Account Number: [id number] Date of Birth/Sex: Treating RN: [birth year] (67 y.o. Larry Callahan Primary Care Darin Arndt: Sallyanne Kuster Other  Clinician: Referring Claudia Greenley: Treating Nocole Zammit/Extender: Weldon Inches Weeks in Treatment: 14 Wound Status Wound Number: 2 Primary Abscess Etiology: Wound Location: Right Gluteus Wound Open Wounding Event: Gradually Appeared Status: Date Acquired: 08/29/2022 Comorbid Chronic Obstructive Pulmonary Disease (COPD), Arrhythmia, Weeks Of Treatment: 12 History: Coronary Artery Disease, Hypertension, Type II Diabetes Clustered Wound: No Photos Larry Callahan, Larry Callahan ([id number]) [id number] Page 7 of 9 Wound Measurements Length: (cm) 1 Width: (cm) 0.8 Depth: (cm) 0.2 Area: (cm) 0.628 Volume: (cm) 0.126 % Reduction in Area: 90.9% % Reduction in Volume: 93.9% Epithelialization: None Wound Description Classification: Full Thickness Without Exposed Suppor Exudate Amount: Medium Exudate Type: Serosanguineous Exudate Color: red, brown t Structures Foul Odor After Cleansing: No Slough/Fibrino No Wound Bed Granulation Amount: Large (67-100%) Exposed Structure Granulation Quality: Red Fascia Exposed: No Necrotic Amount: None Present (0%) Fat Layer (Subcutaneous Tissue) Exposed: Yes Tendon Exposed: No Muscle Exposed: No Joint Exposed: No Bone Exposed: No Treatment Notes Wound #2 (Gluteus) Wound Laterality: Right Cleanser Soap and Water Discharge Instruction: Gently cleanse wound with antibacterial soap, rinse and pat dry prior to dressing wounds Peri-Wound Care Topical Primary Dressing Hydrofera Blue Ready Transfer Foam, 2.5x2.5 (in/in) Discharge Instruction: Apply Hydrofera Blue Ready to wound bed as directed Secondary Dressing Secured With Tegaderm Film 4x4 (in/in) Discharge Instruction: Apply to wound bed Compression Wrap Compression Stockings Add-Ons Electronic Signature(s) Signed: 11/24/2022 4:30:05 PM By: Angelina Pih Signed: 11/25/2022 1:24:35 PM By: Betha Loa Entered By: Betha Loa on 11/24/2022  09:09:46 Larry Callahan, Larry Callahan ([id number]) [id number] Page 8 of 9 -------------------------------------------------------------------------------- Wound Assessment Details Patient Name: Date of Service: Larry Callahan MES Callahan. 11/24/2022 9:00 A M Medical Record Number: [id number] Patient Account Number: [id number] Date of Birth/Sex: Treating RN: [birth year] (67 y.o. Larry Callahan Primary Care Jaidyn Kuhl: Sallyanne Kuster Other Clinician: Referring Kaimana Neuzil: Treating Clancy Leiner/Extender: Weldon Inches Weeks in Treatment: 14 Wound Status Wound Number: 3 Primary Trauma, Other Etiology: Wound Location: Left Hand - 2nd Digit Wound Open Wounding Event: Blister Status: Date Acquired: 11/07/2022 Comorbid Chronic Obstructive Pulmonary Disease (COPD), Arrhythmia, Weeks Of Treatment: 2 History: Coronary Artery Disease, Hypertension, Type II Diabetes Clustered Wound: No Photos Wound Measurements Length: (cm) 1.4 Width: (cm) 2 Depth: (cm) 0.2 Area: (cm) 2.199 Volume: (cm) 0.44 % Reduction in Area: -47.4% % Reduction in Volume: -195.3% Epithelialization: None Wound Description Classification: Full Thickness Without Exposed Suppor Exudate Amount: Medium Exudate Type: Serosanguineous Exudate Color: red, brown t Structures Foul Odor After Cleansing: No Slough/Fibrino Yes Wound Bed Granulation Amount: Medium (34-66%) Exposed Structure Granulation Quality: Pink, Pale Fascia Exposed: No Fat Layer (Subcutaneous Tissue) Exposed: Yes Tendon Exposed: No Muscle Exposed: No Joint Exposed: No Bone Exposed: No Treatment Notes Wound #3 (Hand - 2nd Digit) Wound Laterality: Left Cleanser Soap and Water Discharge Instruction: Gently cleanse wound with antibacterial soap, rinse and pat dry prior to dressing wounds Peri-Wound Care Larry Callahan, Larry Callahan ([id number]) [id number] Page 9 of 9 Topical Primary Dressing Xeroform-HBD 2x2 (in/in) Discharge  Instruction: Apply Xeroform-HBD 2x2 (in/in) as directed Secondary Dressing Gauze Discharge Instruction: As directed: dry, moistened with saline or moistened with Dakins Solution Secured With [employer] Food Dressing, Latex-free, Size 5, Small-Head / Shoulder / Thigh Discharge Instruction: use size 3 stretch net Compression Wrap Compression Stockings Add-Ons Electronic Signature(s) Signed: 11/24/2022 4:30:05 PM By: Angelina Pih Signed: 11/25/2022 1:24:35 PM By: Betha Loa Entered By: Betha Loa on 11/24/2022 09:10:14 -------------------------------------------------------------------------------- Vitals Details Patient Name: Date of Service: Larry Callahan, Larry Callahan MES Callahan. 11/24/2022 9:00 A M Medical Record Number: [id number] Patient Account Number: [id number] Date of Birth/Sex: Treating RN: Mar 24, 1956 (67 y.o. Larry Callahan Primary Care Jedaiah Rathbun: Sallyanne Kuster Other Clinician: Referring Amela Handley: Treating Mansfield Dann/Extender: Weldon Inches Weeks in Treatment: 14 Vital Signs Time Taken: 08:58 Temperature (Callahan): 97.7 Height (in): 71 Pulse (bpm): 66 Weight (lbs): 229 Respiratory Rate (breaths/min): 18 Body Mass Index (BMI): 31.9 Blood Pressure (mmHg): 147/88 Reference Range: 80 - 120 mg / dl Electronic Signature(s) Signed: 11/25/2022 1:24:35 PM By: Betha Loa Entered By: Betha Loa on 11/24/2022 09:00:26

## 2022-11-28 DIAGNOSIS — E785 Hyperlipidemia, unspecified: Secondary | ICD-10-CM | POA: Diagnosis not present

## 2022-11-28 DIAGNOSIS — I2510 Atherosclerotic heart disease of native coronary artery without angina pectoris: Secondary | ICD-10-CM | POA: Diagnosis not present

## 2022-11-28 DIAGNOSIS — I110 Hypertensive heart disease with heart failure: Secondary | ICD-10-CM | POA: Diagnosis not present

## 2022-11-28 DIAGNOSIS — L0231 Cutaneous abscess of buttock: Secondary | ICD-10-CM | POA: Diagnosis not present

## 2022-11-28 DIAGNOSIS — I509 Heart failure, unspecified: Secondary | ICD-10-CM | POA: Diagnosis not present

## 2022-11-28 DIAGNOSIS — J449 Chronic obstructive pulmonary disease, unspecified: Secondary | ICD-10-CM | POA: Diagnosis not present

## 2022-11-28 DIAGNOSIS — Z602 Problems related to living alone: Secondary | ICD-10-CM | POA: Diagnosis not present

## 2022-11-28 DIAGNOSIS — E119 Type 2 diabetes mellitus without complications: Secondary | ICD-10-CM | POA: Diagnosis not present

## 2022-11-28 DIAGNOSIS — G4733 Obstructive sleep apnea (adult) (pediatric): Secondary | ICD-10-CM | POA: Diagnosis not present

## 2022-11-29 ENCOUNTER — Ambulatory Visit (INDEPENDENT_AMBULATORY_CARE_PROVIDER_SITE_OTHER): Payer: Medicare PPO | Admitting: Nurse Practitioner

## 2022-11-29 ENCOUNTER — Encounter: Payer: Self-pay | Admitting: Nurse Practitioner

## 2022-11-29 VITALS — BP 135/75 | HR 60 | Temp 98.2°F | Resp 16 | Ht 71.0 in | Wt 205.0 lb

## 2022-11-29 DIAGNOSIS — G8929 Other chronic pain: Secondary | ICD-10-CM

## 2022-11-29 DIAGNOSIS — M25561 Pain in right knee: Secondary | ICD-10-CM | POA: Diagnosis not present

## 2022-11-29 DIAGNOSIS — M25562 Pain in left knee: Secondary | ICD-10-CM | POA: Diagnosis not present

## 2022-11-29 DIAGNOSIS — M25512 Pain in left shoulder: Secondary | ICD-10-CM

## 2022-11-29 MED ORDER — TRAMADOL HCL 50 MG PO TABS
50.0000 mg | ORAL_TABLET | Freq: Two times a day (BID) | ORAL | 0 refills | Status: AC | PRN
Start: 2022-11-29 — End: 2022-12-04

## 2022-11-29 MED ORDER — METHOCARBAMOL 750 MG PO TABS
750.0000 mg | ORAL_TABLET | Freq: Four times a day (QID) | ORAL | 1 refills | Status: DC | PRN
Start: 2022-11-29 — End: 2023-10-05

## 2022-11-29 NOTE — Progress Notes (Signed)
[facility] [address] [place], [place] [address]  Internal MEDICINE  Office Visit Note  Patient Name: Larry Callahan  [id number]  [id number]  Date of Service: 11/29/2022  Chief Complaint  Patient presents with   Acute Visit    Bilateral knee pain     HPI Larry Callahan presents for an acute sick visit for bilateral knee pain --onset was several months ago No fall or injury noted Difficulty climbing steps, difficulty getting out of a chair.  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      Current Medication:  Outpatient Encounter Medications as of 11/29/2022  Medication Sig   Accu-Chek Softclix Lancets lancets Use to check blood sugars 4 times a day   E11.65   acetaminophen (TYLENOL) 325 MG tablet Take 2 tablets (650 mg total) by mouth every 6 (six) hours as needed for mild pain or moderate pain (or Fever >/= 101).   acetaminophen-codeine (TYLENOL #3) 300-30 MG tablet Take one tab po at night for left shoulder pain   alum & mag hydroxide-simeth (MAALOX/MYLANTA) 200-200-20 MG/5ML suspension Take 30 mLs by mouth every 4 (four) hours as needed for indigestion or heartburn.   apixaban (ELIQUIS) 5 MG TABS tablet Take 1 tablet (5 mg total) by mouth 2 (two) times daily.   bisacodyl (DULCOLAX) 5 MG EC tablet Take 2 tablets (10 mg total) by mouth daily as needed for moderate constipation.   Blood Glucose Monitoring Suppl (TRUE METRIX AIR GLUCOSE METER) DEVI 1 Device by Does not apply route 2 (two) times daily.   Budeson-Glycopyrrol-Formoterol (BREZTRI AEROSPHERE) 160-9-4.8 MCG/ACT AERO Inhale 2 puffs into the lungs 2 (two) times daily. (Patient taking differently: Inhale 2 puffs into the lungs 2 (two) times daily as needed.)   carvedilol (COREG) 12.5 MG tablet Take 1 tablet (12.5 mg total) by mouth 2 (two) times daily.   cyanocobalamin 1000 MCG tablet Take 1 tablet (1,000 mcg total) by mouth daily.    diclofenac Sodium (VOLTAREN ARTHRITIS PAIN) 1 % GEL Apply to left arm and shoulder twice daily as needed for pain   furosemide (LASIX) 40 MG tablet Take 1 tablet (40 mg total) by mouth daily.   glimepiride (AMARYL) 2 MG tablet Take 1 tablet (2 mg total) by mouth daily.   glucose blood (ACCU-CHEK GUIDE) test strip Use to check blood sugars 4 times a day   E11.65   hydrALAZINE (APRESOLINE) 100 MG tablet Take 1 tablet (100 mg total) by mouth 3 (three) times daily.   insulin glargine, 2 Unit Dial, (TOUJEO MAX SOLOSTAR) 300 UNIT/ML Solostar Pen Inject 20 units in am   Insulin Pen Needle (NOVOFINE PEN NEEDLE) 32G X 6 MM MISC Use as directed with toujeo pen   ipratropium-albuterol (DUONEB) 0.5-2.5 (3) MG/3ML SOLN Take 3 mLs by nebulization every 6 (six) hours as needed.   iron polysaccharides (NIFEREX) 150 MG capsule Take 1 capsule (150 mg total) by mouth daily.   methocarbamol (ROBAXIN) 750 MG tablet Take 1 tablet (750 mg total) by mouth every 6 (six) hours as needed for muscle spasms.   omeprazole (PRILOSEC) 40 MG capsule Take 1 capsule (40 mg total) by mouth daily. Take 1 capsule once in the morning and once in the evening   ondansetron (ZOFRAN-ODT) 4 MG disintegrating tablet Take 1 tablet (4 mg total) by mouth every 6 (six) hours as needed for nausea or vomiting.   polyethylene glycol (MIRALAX) 17 g packet Take 17 g  by mouth daily.   potassium chloride SA (KLOR-CON M) 20 MEQ tablet Take 1 tablet (20 mEq total) by mouth 2 (two) times daily.   rosuvastatin (CRESTOR) 10 MG tablet Take 1 tablet (10 mg total) by mouth at bedtime.   TOUJEO SOLOSTAR 300 UNIT/ML Solostar Pen Inject 26 Units into the skin daily.   traMADol (ULTRAM) 50 MG tablet Take 1 tablet (50 mg total) by mouth 2 (two) times daily as needed for up to 5 days for severe pain.   Vitamin D, Ergocalciferol, (DRISDOL) 1.25 MG (50000 UNIT) CAPS capsule Take 1 capsule (50,000 Units total) by mouth every 7 (seven) days.   No facility-administered  encounter medications on file as of 11/29/2022.      Medical History: Past Medical History:  Diagnosis Date   Allergy    Atrial flutter (HCC)    a. Dx 12/2020--CHA2DS2VASc = 5-->eliquis.   Chronic combined systolic (congestive) and diastolic (congestive) heart failure (HCC)    a. 04/2019 Echo: EF 45-50%; b. 02/2019 Echo: EF 55-60%; c. 09/2020 Echo: EF 35-40%, glob HK. Nl RV size/fxn. Mild MR; d. 07/2021 Echo: EF 40-45%, mild LVH, low-nl RV fxn, no MR.   CKD (chronic kidney disease), stage III (HCC)    COPD (chronic obstructive pulmonary disease) (HCC)    Coronary artery disease    a. 2011 Cath: nonobs dzs; b. 09/2020 MV: EF 38%. No signif ischemia. ? inf MI vs diaph attenuation. GI uptake artifact.   Diabetes mellitus without complication (HCC)    GERD (gastroesophageal reflux disease)    History of CVA (cerebrovascular accident) 09/25/2020   Hyperlipidemia    Hypertension    Morbid (severe) obesity due to excess calories (HCC) 06/09/2017   Morbid obesity (HCC)    NICM (nonischemic cardiomyopathy) (HCC)    a. 04/2019 Echo: EF 45-50%; b. 02/2019 Echo: EF 55-60%; c. 09/2020 Echo: EF 35-40%, glob HK; d. 09/2020 MV: No ischemia. ? inf infarct vs attenuation; d. 07/2021 Echo: EF 40-45%.   OSA (obstructive sleep apnea)    Pneumonia due to COVID-19 virus 01/30/2020   Stage 3b chronic kidney disease (HCC)    Syncope and collapse 11/16/2020     Vital Signs: BP 135/75 Comment: 140/80  Pulse 60   Temp 98.2 F (36.8 C)   Resp 16   Ht 5\' 11"  (1.803 m)   Wt 205 lb (93 kg)   SpO2 97%   BMI 28.59 kg/m    Review of Systems  Constitutional:  Negative for chills, fatigue and unexpected weight change.  HENT:  Negative for congestion, postnasal drip, rhinorrhea, sneezing and sore throat.   Eyes:  Negative for redness.  Respiratory: Negative.  Negative for cough, chest tightness, shortness of breath and wheezing.   Cardiovascular: Negative.  Negative for chest pain and palpitations.   Gastrointestinal:  Negative for abdominal pain, constipation, diarrhea, nausea and vomiting.  Genitourinary:  Negative for dysuria and frequency.  Musculoskeletal:  Positive for arthralgias (bilateral knee pain and right shoulder pain), gait problem, joint swelling and myalgias. Negative for back pain and neck pain.  Skin:  Negative for rash.  Neurological:  Negative for tremors and numbness.  Hematological:  Negative for adenopathy. Does not bruise/bleed easily.  Psychiatric/Behavioral:  Negative for behavioral problems (Depression), sleep disturbance and suicidal ideas. The patient is not nervous/anxious.     Physical Exam Vitals reviewed.  Constitutional:      Appearance: Normal appearance. He is obese.  HENT:     Head: Normocephalic and atraumatic.  Eyes:  Pupils: Pupils are equal, round, and reactive to light.  Cardiovascular:     Rate and Rhythm: Normal rate and regular rhythm.  Pulmonary:     Effort: Pulmonary effort is normal. No respiratory distress.  Musculoskeletal:     Right knee: Swelling present. Decreased range of motion. Tenderness present.     Left knee: Swelling present. Decreased range of motion. Tenderness present.  Neurological:     Mental Status: He is alert and oriented to person, place, and time.  Psychiatric:        Mood and Affect: Mood normal.        Behavior: Behavior normal.       Assessment/Plan: 1. Bilateral chronic knee pain Xrays of both knees ordered, pain medication and muscle relaxant ordered prn as well. Will refer to orthopedic surgery pending xray results.  - DG Knee Complete 4 Views Left; Future - DG Knee Complete 4 Views Right; Future - methocarbamol (ROBAXIN) 750 MG tablet; Take 1 tablet (750 mg total) by mouth every 6 (six) hours as needed for muscle spasms.  Dispense: 120 tablet; Refill: 1 - traMADol (ULTRAM) 50 MG tablet; Take 1 tablet (50 mg total) by mouth 2 (two) times daily as needed for up to 5 days for severe pain.   Dispense: 10 tablet; Refill: 0  2. Chronic left shoulder pain Methocarbamol and tramadol prescribed.  - methocarbamol (ROBAXIN) 750 MG tablet; Take 1 tablet (750 mg total) by mouth every 6 (six) hours as needed for muscle spasms.  Dispense: 120 tablet; Refill: 1 - traMADol (ULTRAM) 50 MG tablet; Take 1 tablet (50 mg total) by mouth 2 (two) times daily as needed for up to 5 days for severe pain.  Dispense: 10 tablet; Refill: 0   General Counseling: Larry Callahan verbalizes understanding of the findings of todays visit and agrees with plan of treatment. I have discussed any further diagnostic evaluation that may be needed or ordered today. We also reviewed his medications today. he has been encouraged to call the office with any questions or concerns that should arise related to todays visit.    Counseling:    Orders Placed This Encounter  Procedures   DG Knee Complete 4 Views Left   DG Knee Complete 4 Views Right    Meds ordered this encounter  Medications   methocarbamol (ROBAXIN) 750 MG tablet    Sig: Take 1 tablet (750 mg total) by mouth every 6 (six) hours as needed for muscle spasms.    Dispense:  120 tablet    Refill:  1    Fill new script today and please deliver to patient asap   traMADol (ULTRAM) 50 MG tablet    Sig: Take 1 tablet (50 mg total) by mouth 2 (two) times daily as needed for up to 5 days for severe pain.    Dispense:  10 tablet    Refill:  0    Fill script today and deliver to patient asap    Return for will order orthopedic referral after xrays are resulted also need appt in july for diabetes .  [place] Controlled Substance Database was reviewed by me for overdose risk score (ORS)  Time spent:20 Minutes Time spent with patient included reviewing progress notes, labs, imaging studies, and discussing plan for follow up.   This patient was seen by Sallyanne Kuster, FNP-C in collaboration with Dr. Beverely Risen as a part of collaborative care agreement.  Alizea Pell R.  Tedd Sias, MSN, FNP-C Internal Medicine

## 2022-11-29 NOTE — Patient Instructions (Signed)
Please call your transportation services to schedule at the next available time to take you to do your xrays at the [facility] outpatient imaging center on [address].

## 2022-11-30 DIAGNOSIS — Z602 Problems related to living alone: Secondary | ICD-10-CM | POA: Diagnosis not present

## 2022-11-30 DIAGNOSIS — E119 Type 2 diabetes mellitus without complications: Secondary | ICD-10-CM | POA: Diagnosis not present

## 2022-11-30 DIAGNOSIS — I509 Heart failure, unspecified: Secondary | ICD-10-CM | POA: Diagnosis not present

## 2022-11-30 DIAGNOSIS — E785 Hyperlipidemia, unspecified: Secondary | ICD-10-CM | POA: Diagnosis not present

## 2022-11-30 DIAGNOSIS — G4733 Obstructive sleep apnea (adult) (pediatric): Secondary | ICD-10-CM | POA: Diagnosis not present

## 2022-11-30 DIAGNOSIS — I110 Hypertensive heart disease with heart failure: Secondary | ICD-10-CM | POA: Diagnosis not present

## 2022-11-30 DIAGNOSIS — I2510 Atherosclerotic heart disease of native coronary artery without angina pectoris: Secondary | ICD-10-CM | POA: Diagnosis not present

## 2022-11-30 DIAGNOSIS — L0231 Cutaneous abscess of buttock: Secondary | ICD-10-CM | POA: Diagnosis not present

## 2022-11-30 DIAGNOSIS — J449 Chronic obstructive pulmonary disease, unspecified: Secondary | ICD-10-CM | POA: Diagnosis not present

## 2022-12-01 ENCOUNTER — Encounter: Payer: Medicare PPO | Admitting: Physician Assistant

## 2022-12-01 DIAGNOSIS — J449 Chronic obstructive pulmonary disease, unspecified: Secondary | ICD-10-CM | POA: Diagnosis not present

## 2022-12-01 DIAGNOSIS — E11622 Type 2 diabetes mellitus with other skin ulcer: Secondary | ICD-10-CM | POA: Diagnosis not present

## 2022-12-01 DIAGNOSIS — E1151 Type 2 diabetes mellitus with diabetic peripheral angiopathy without gangrene: Secondary | ICD-10-CM | POA: Diagnosis not present

## 2022-12-01 DIAGNOSIS — I10 Essential (primary) hypertension: Secondary | ICD-10-CM | POA: Diagnosis not present

## 2022-12-01 DIAGNOSIS — L0231 Cutaneous abscess of buttock: Secondary | ICD-10-CM | POA: Diagnosis not present

## 2022-12-01 DIAGNOSIS — I2510 Atherosclerotic heart disease of native coronary artery without angina pectoris: Secondary | ICD-10-CM | POA: Diagnosis not present

## 2022-12-01 DIAGNOSIS — S61211A Laceration without foreign body of left index finger without damage to nail, initial encounter: Secondary | ICD-10-CM | POA: Diagnosis not present

## 2022-12-01 DIAGNOSIS — L98492 Non-pressure chronic ulcer of skin of other sites with fat layer exposed: Secondary | ICD-10-CM | POA: Diagnosis not present

## 2022-12-01 NOTE — Progress Notes (Addendum)
ZAM, ENT ([id number]) [id number] Page 1 of 10 Visit Report for 12/01/2022 Arrival Information Details Patient Name: Date of Service: Larry Callahan MES F. 12/01/2022 9:00 A M Medical Record Number: [id number] Patient Account Number: [id number] Date of Birth/Sex: Treating RN: Oct 16, 1955 (67 y.o. Larry Callahan Primary Care Alyssa Rotondo: Sallyanne Kuster Other Clinician: Betha Loa Referring Anyeli Hockenbury: Treating Michal Strzelecki/Extender: Weldon Inches Weeks in Treatment: 15 Visit Information History Since Last Visit All ordered tests and consults were completed: No Patient Arrived: Ambulatory Added or deleted any medications: No Arrival Time: 08:54 Any new allergies or adverse reactions: No Transfer Assistance: None Had a fall or experienced change in No Patient Identification Verified: Yes activities of daily living that may affect Secondary Verification Process Completed: Yes risk of falls: Patient Requires Transmission-Based Precautions: No Signs or symptoms of abuse/neglect since last visito No Patient Has Alerts: Yes Hospitalized since last visit: No Patient Alerts: Patient on Blood Thinner Implantable device outside of the clinic excluding No cellular tissue based products placed in the center since last visit: Has Dressing in Place as Prescribed: Yes Pain Present Now: No Electronic Signature(s) Signed: 12/01/2022 5:12:55 PM By: Betha Loa Entered By: Betha Loa on 12/01/2022 08:59:32 -------------------------------------------------------------------------------- Clinic Level of Care Assessment Details Patient Name: Date of Service: Larry Callahan MES F. 12/01/2022 9:00 A M Medical Record Number: [id number] Patient Account Number: [id number] Date of Birth/Sex: Treating RN: 1956-01-31 (67 y.o. Larry Callahan Primary Care Cienna Dumais: Sallyanne Kuster Other Clinician: Betha Loa Referring Dshaun Reppucci: Treating  Veer Elamin/Extender: Weldon Inches Weeks in Treatment: 15 Clinic Level of Care Assessment Items TOOL 4 Quantity Score []  - 0 Use when only an EandM is performed on FOLLOW-UP visit ASSESSMENTS - Nursing Assessment / Reassessment X- 1 10 Reassessment of Co-morbidities (includes updates in patient status) X- 1 5 Reassessment of Adherence to Treatment Plan RANDYN, FELAND ([id number]) [id number] Page 2 of 10 ASSESSMENTS - Wound and Skin A ssessment / Reassessment []  - 0 Simple Wound Assessment / Reassessment - one wound X- 2 5 Complex Wound Assessment / Reassessment - multiple wounds []  - 0 Dermatologic / Skin Assessment (not related to wound area) ASSESSMENTS - Focused Assessment []  - 0 Circumferential Edema Measurements - multi extremities []  - 0 Nutritional Assessment / Counseling / Intervention []  - 0 Lower Extremity Assessment (monofilament, tuning fork, pulses) []  - 0 Peripheral Arterial Disease Assessment (using hand held doppler) ASSESSMENTS - Ostomy and/or Continence Assessment and Care []  - 0 Incontinence Assessment and Management []  - 0 Ostomy Care Assessment and Management (repouching, etc.) PROCESS - Coordination of Care X - Simple Patient / Family Education for ongoing care 1 15 []  - 0 Complex (extensive) Patient / Family Education for ongoing care []  - 0 Staff obtains Chiropractor, Records, T Results / Process Orders est []  - 0 Staff telephones HHA, Nursing Homes / Clarify orders / etc []  - 0 Routine Transfer to another Facility (non-emergent condition) []  - 0 Routine Hospital Admission (non-emergent condition) []  - 0 New Admissions / Manufacturing engineer / Ordering NPWT Apligraf, etc. , []  - 0 Emergency Hospital Admission (emergent condition) X- 1 10 Simple Discharge Coordination []  - 0 Complex (extensive) Discharge Coordination PROCESS - Special Needs []  - 0 Pediatric / Minor Patient Management []  -  0 Isolation Patient Management []  - 0 Hearing / Language / Visual special needs []  - 0 Assessment of Community assistance (transportation, D/C planning, etc.) []  - 0 Additional assistance / Altered mentation []  - 0 Support Surface(s)  Assessment (bed, cushion, seat, etc.) INTERVENTIONS - Wound Cleansing / Measurement []  - 0 Simple Wound Cleansing - one wound X- 2 5 Complex Wound Cleansing - multiple wounds X- 1 5 Wound Imaging (photographs - any number of wounds) []  - 0 Wound Tracing (instead of photographs) []  - 0 Simple Wound Measurement - one wound X- 2 5 Complex Wound Measurement - multiple wounds INTERVENTIONS - Wound Dressings []  - 0 Small Wound Dressing one or multiple wounds X- 2 15 Medium Wound Dressing one or multiple wounds []  - 0 Large Wound Dressing one or multiple wounds []  - 0 Application of Medications - topical []  - 0 Application of Medications - injection INTERVENTIONS - Miscellaneous []  - 0 External ear exam OLALEKAN, LEHTINEN ([id number]) [id number] Page 3 of 10 []  - 0 Specimen Collection (cultures, biopsies, blood, body fluids, etc.) []  - 0 Specimen(s) / Culture(s) sent or taken to Lab for analysis []  - 0 Patient Transfer (multiple staff / Michiel Sites Lift / Similar devices) []  - 0 Simple Staple / Suture removal (25 or less) []  - 0 Complex Staple / Suture removal (26 or more) []  - 0 Hypo / Hyperglycemic Management (close monitor of Blood Glucose) []  - 0 Ankle / Brachial Index (ABI) - do not check if billed separately X- 1 5 Vital Signs Has the patient been seen at the hospital within the last three years: Yes Total Score: 110 Level Of Care: New/Established - Level 3 Electronic Signature(s) Signed: 12/01/2022 5:12:55 PM By: Betha Loa Entered By: Betha Loa on 12/01/2022 09:50:48 -------------------------------------------------------------------------------- Encounter Discharge Information Details Patient Name:  Date of Service: Larry Callahan MES F. 12/01/2022 9:00 A M Medical Record Number: [id number] Patient Account Number: [id number] Date of Birth/Sex: Treating RN: March 12, 1956 (67 y.o. Larry Callahan Primary Care Degan Hanser: Sallyanne Kuster Other Clinician: Betha Loa Referring Briston Lax: Treating Keidan Aumiller/Extender: Weldon Inches Weeks in Treatment: 15 Encounter Discharge Information Items Discharge Condition: Stable Ambulatory Status: Ambulatory Discharge Destination: Home Transportation: Private Auto Accompanied By: self Schedule Follow-up Appointment: Yes Clinical Summary of Care: Electronic Signature(s) Signed: 12/01/2022 5:12:55 PM By: Betha Loa Entered By: Betha Loa on 12/01/2022 09:59:16 Lower Extremity Assessment Details -------------------------------------------------------------------------------- [place] ([id number]) [id number] Page 4 of 10 Patient Name: Date of Service: Larry Callahan MES F. 12/01/2022 9:00 A M Medical Record Number: [id number] Patient Account Number: [id number] Date of Birth/Sex: Treating RN: 03/27/1956 (67 y.o. Larry Callahan Primary Care Sahar Ryback: Sallyanne Kuster Other Clinician: Betha Loa Referring Yuval Rubens: Treating Kyoko Elsea/Extender: Weldon Inches Weeks in Treatment: 15 Electronic Signature(s) Signed: 12/01/2022 4:16:40 PM By: Angelina Pih Signed: 12/01/2022 5:12:55 PM By: Betha Loa Entered By: Betha Loa on 12/01/2022 09:09:08 -------------------------------------------------------------------------------- Multi Wound Chart Details Patient Name: Date of Service: Larry Callahan MES F. 12/01/2022 9:00 A M Medical Record Number: [id number] Patient Account Number: [id number] Date of Birth/Sex: Treating RN: [birth year] (67 y.o. Larry Callahan Primary Care Rigo Letts: Sallyanne Kuster Other Clinician: Betha Loa Referring Syncere Eble: Treating  Joene Gelder/Extender: Weldon Inches Weeks in Treatment: 15 Vital Signs Height(in): 71 Pulse(bpm): 69 Weight(lbs): 229 Blood Pressure(mmHg): 134/79 Body Mass Index(BMI): 31.9 Temperature(F): 97.6 Respiratory Rate(breaths/min): 18 [2:Photos:] [N/A:N/A] Right Gluteus Left Hand - 2nd Digit N/A Wound Location: Gradually Appeared Blister N/A Wounding Event: Abscess Trauma, Other N/A Primary Etiology: Chronic Obstructive Pulmonary Chronic Obstructive Pulmonary N/A Comorbid History: Disease (COPD), Arrhythmia, Disease (COPD), Arrhythmia, Coronary Artery Disease, Coronary Artery Disease, Hypertension, Type II Diabetes Hypertension, Type II Diabetes 08/29/2022 11/07/2022 N/A Date Acquired: 13 3 N/A  Weeks of Treatment: Open Open N/A Wound Status: No No N/A Wound Recurrence: 0.6x0.8x0.2 1x1.4x0.1 N/A Measurements L x W x D (cm) 0.377 1.1 N/A A (cm) : rea 0.075 0.11 N/A Volume (cm) : 94.50% 26.30% N/A % Reduction in Area: 96.40% 26.20% N/A % Reduction in Volume: Full Thickness Without Exposed Full Thickness Without Exposed N/A Classification: Support Structures Support Structures Medium Medium N/A Exudate Amount: Serosanguineous Serosanguineous N/A Exudate Type: red, brown red, brown N/A Exudate Color: Large (67-100%) Medium (34-66%) N/A Granulation Amount: Red Pink, Pale N/A Granulation Quality: None Present (0%) N/A N/A Necrotic AmountZIYANG, ORIO ([id number]) [id number] Page 5 of 10 Fat Layer (Subcutaneous Tissue): Yes Fat Layer (Subcutaneous Tissue): Yes N/A Exposed Structures: Fascia: No Fascia: No Tendon: No Tendon: No Muscle: No Muscle: No Joint: No Joint: No Bone: No Bone: No None None N/A Epithelialization: Treatment Notes Electronic Signature(s) Signed: 12/01/2022 5:12:55 PM By: Betha Loa Entered By: Betha Loa on 12/01/2022  09:09:12 -------------------------------------------------------------------------------- Multi-Disciplinary Care Plan Details Patient Name: Date of Service: Larry Callahan MES F. 12/01/2022 9:00 A M Medical Record Number: [id number] Patient Account Number: [id number] Date of Birth/Sex: Treating RN: 1956-02-01 (67 y.o. Larry Callahan Primary Care Purl Claytor: Sallyanne Kuster Other Clinician: Betha Loa Referring Derian Pfost: Treating Murel Wigle/Extender: Weldon Inches Weeks in Treatment: 15 Active Inactive Necrotic Tissue Nursing Diagnoses: Knowledge deficit related to management of necrotic/devitalized tissue Goals: Necrotic/devitalized tissue will be minimized in the wound bed Date Initiated: 09/01/2022 Target Resolution Date: 11/01/2022 Goal Status: Active Interventions: Assess patient pain level pre-, during and post procedure and prior to discharge Provide education on necrotic tissue and debridement process Notes: Electronic Signature(s) Signed: 12/01/2022 4:16:40 PM By: Angelina Pih Signed: 12/01/2022 5:12:55 PM By: Betha Loa Entered By: Betha Loa on 12/01/2022 09:58:10 [place] ([id number]) [id number] Page 6 of 10 -------------------------------------------------------------------------------- Pain Assessment Details Patient Name: Date of Service: Larry Callahan MES F. 12/01/2022 9:00 A M Medical Record Number: [id number] Patient Account Number: [id number] Date of Birth/Sex: Treating RN: 03-Jan-1956 (67 y.o. Larry Callahan Primary Care Tallin Hart: Sallyanne Kuster Other Clinician: Betha Loa Referring Chrissi Crow: Treating Anber Mckiver/Extender: Weldon Inches Weeks in Treatment: 15 Active Problems Location of Pain Severity and Description of Pain Patient Has Paino No Site Locations Pain Management and Medication Current Pain Management: Electronic Signature(s) Signed: 12/01/2022 4:16:40 PM By:  Angelina Pih Signed: 12/01/2022 5:12:55 PM By: Betha Loa Entered By: Betha Loa on 12/01/2022 09:02:36 -------------------------------------------------------------------------------- Patient/Caregiver Education Details Patient Name: Date of Service: Larry Callahan MES F. 6/20/2024andnbsp9:00 A M Medical Record Number: [id number] Patient Account Number: [id number] Date of Birth/Gender: Treating RN: June 14, 1955 (67 y.o. Larry Callahan Primary Care Physician: Sallyanne Kuster Other Clinician: Betha Loa Referring Physician: Treating Physician/Extender: Weldon Inches Weeks in Treatment: 15 Education Assessment Education Provided To: Patient Education Topics Provided Wound/Skin Impairment: Handouts: Other: continue wound care as directed Methods: Explain/Verbal JAQUALYN, HERDT ([id number]) [id number] Page 7 of 10 Responses: State content correctly Electronic Signature(s) Signed: 12/01/2022 5:12:55 PM By: Betha Loa Entered By: Betha Loa on 12/01/2022 09:58:33 -------------------------------------------------------------------------------- Wound Assessment Details Patient Name: Date of Service: Larry Callahan MES F. 12/01/2022 9:00 A M Medical Record Number: [id number] Patient Account Number: [id number] Date of Birth/Sex: Treating RN: 30-Mar-1956 (67 y.o. Larry Callahan Primary Care Elisah Parmer: Sallyanne Kuster Other Clinician: Betha Loa Referring Genessa Beman: Treating Andromeda Poppen/Extender: Weldon Inches Weeks in Treatment: 15 Wound Status Wound Number: 2 Primary Abscess Etiology: Wound Location: Right Gluteus Wound Open Wounding Event: Gradually  Appeared Status: Date Acquired: 08/29/2022 Comorbid Chronic Obstructive Pulmonary Disease (COPD), Arrhythmia, Weeks Of Treatment: 13 History: Coronary Artery Disease, Hypertension, Type II Diabetes Clustered Wound: No Photos Wound  Measurements Length: (cm) 0.6 Width: (cm) 0.8 Depth: (cm) 0.2 Area: (cm) 0.377 Volume: (cm) 0.075 % Reduction in Area: 94.5% % Reduction in Volume: 96.4% Epithelialization: None Wound Description Classification: Full Thickness Without Exposed Suppor Exudate Amount: Medium Exudate Type: Serosanguineous Exudate Color: red, brown t Structures Foul Odor After Cleansing: No Slough/Fibrino No Wound Bed Granulation Amount: Large (67-100%) Exposed Structure Granulation Quality: Red Fascia Exposed: No Necrotic Amount: None Present (0%) Fat Layer (Subcutaneous Tissue) Exposed: Yes Tendon Exposed: No Muscle Exposed: No Joint Exposed: No Bone Exposed: No TRAYQUAN, CAMPEAU ([id number]) [id number] Page 8 of 10 Treatment Notes Wound #2 (Gluteus) Wound Laterality: Right Cleanser Soap and Water Discharge Instruction: Gently cleanse wound with antibacterial soap, rinse and pat dry prior to dressing wounds Peri-Wound Care Topical Primary Dressing Hydrofera Blue Ready Transfer Foam, 2.5x2.5 (in/in) Discharge Instruction: Apply Hydrofera Blue Ready to wound bed as directed Secondary Dressing T Adhesive [employer], 4x4 (in/in) elfa Discharge Instruction: Apply over dressing to secure in place. Secured With Compression Wrap Compression Stockings Facilities manager) Signed: 12/01/2022 4:16:40 PM By: Angelina Pih Signed: 12/01/2022 5:12:55 PM By: Betha Loa Entered By: Betha Loa on 12/01/2022 09:08:37 -------------------------------------------------------------------------------- Wound Assessment Details Patient Name: Date of Service: Larry Callahan MES F. 12/01/2022 9:00 A M Medical Record Number: [id number] Patient Account Number: [id number] Date of Birth/Sex: Treating RN: 03/03/1956 (67 y.o. Larry Callahan Primary Care Marijo Quizon: Sallyanne Kuster Other Clinician: Betha Loa Referring Dhriti Fales: Treating Yilin Weedon/Extender:  Weldon Inches Weeks in Treatment: 15 Wound Status Wound Number: 3 Primary Trauma, Other Etiology: Wound Location: Left Hand - 2nd Digit Wound Open Wounding Event: Blister Status: Date Acquired: 11/07/2022 Comorbid Chronic Obstructive Pulmonary Disease (COPD), Arrhythmia, Weeks Of Treatment: 3 History: Coronary Artery Disease, Hypertension, Type II Diabetes Clustered Wound: No Photos ALTUS, SMEDBERG ([id number]) [id number] Page 9 of 10 Wound Measurements Length: (cm) 1 Width: (cm) 1.4 Depth: (cm) 0.1 Area: (cm) 1.1 Volume: (cm) 0.11 % Reduction in Area: 26.3% % Reduction in Volume: 26.2% Epithelialization: None Wound Description Classification: Full Thickness Without Exposed Suppor Exudate Amount: Medium Exudate Type: Serosanguineous Exudate Color: red, brown t Structures Foul Odor After Cleansing: No Slough/Fibrino Yes Wound Bed Granulation Amount: Medium (34-66%) Exposed Structure Granulation Quality: Pink, Pale Fascia Exposed: No Fat Layer (Subcutaneous Tissue) Exposed: Yes Tendon Exposed: No Muscle Exposed: No Joint Exposed: No Bone Exposed: No Treatment Notes Wound #3 (Hand - 2nd Digit) Wound Laterality: Left Cleanser Soap and Water Discharge Instruction: Gently cleanse wound with antibacterial soap, rinse and pat dry prior to dressing wounds Peri-Wound Care Topical Primary Dressing Hydrofera Blue Ready Transfer Foam, 2.5x2.5 (in/in) Discharge Instruction: Apply Hydrofera Blue Ready to wound bed as directed Secondary Dressing Gauze Discharge Instruction: As directed: dry, moistened with saline or moistened with Dakins Solution Secured With [employer] Dressing, Latex-free, Size 5, Small-Head / Shoulder / Thigh Discharge Instruction: use size 3 stretch net Compression Wrap Compression Stockings Add-Ons Electronic Signature(s) Signed: 12/01/2022 4:16:40 PM By: Angelina Pih Signed: 12/01/2022 5:12:55 PM By:  Betha Loa Entered By: Betha Loa on 12/01/2022 09:09:01 [place] ([id number]) [id number] Page 10 of 10 -------------------------------------------------------------------------------- Vitals Details Patient Name: Date of Service: Larry Callahan MES F. 12/01/2022 9:00 A M Medical Record Number: [id number] Patient Account Number: [id number] Date of Birth/Sex: Treating RN: 07/19/1955 (67 y.o. Larry Callahan Primary  Care Darion Juhasz: Sallyanne Kuster Other Clinician: Betha Loa Referring Maniya Donovan: Treating Adileny Delon/Extender: Weldon Inches Weeks in Treatment: 15 Vital Signs Time Taken: 08:59 Temperature (F): 97.6 Height (in): 71 Pulse (bpm): 69 Weight (lbs): 229 Respiratory Rate (breaths/min): 18 Body Mass Index (BMI): 31.9 Blood Pressure (mmHg): 134/79 Reference Range: 80 - 120 mg / dl Electronic Signature(s) Signed: 12/01/2022 5:12:55 PM By: Betha Loa Entered By: Betha Loa on 12/01/2022 09:02:14

## 2022-12-01 NOTE — Progress Notes (Addendum)
Larry Callahan, Larry Callahan ([id number]) [id number] Page 1 of 8 Visit Report for 12/01/2022 Chief Complaint Document Details Patient Name: Date of Service: Larry Callahan MES F. 12/01/2022 9:00 A M Medical Record Number: [id number] Patient Account Number: [id number] Date of Birth/Sex: Treating RN: Apr 30, 1956 (67 y.o. Larry Callahan Primary Care Provider: Sallyanne Kuster Other Clinician: [name] Referring Provider: Treating Provider/Extender: Weldon Inches Weeks in Treatment: 15 Information Obtained from: Patient Chief Complaint Right gluteal abscess and Left 2nd finger ulcer Electronic Signature(s) Signed: 12/01/2022 8:50:10 AM By: Larry Derry PA-C Entered By: Larry Callahan on 12/01/2022 08:50:10 -------------------------------------------------------------------------------- HPI Details Patient Name: Date of Service: Larry Callahan MES F. 12/01/2022 9:00 A M Medical Record Number: [id number] Patient Account Number: [id number] Date of Birth/Sex: Treating RN: Sep 04, 1955 (66 y.o. Larry Callahan Primary Care Provider: Sallyanne Kuster Other Clinician: [name] Referring Provider: Treating Provider/Extender: Weldon Inches Weeks in Treatment: 15 History of Present Illness HPI Description: ADMISSION 08/15/2022 This is a 67 year old man who came in with very little medical information. There was some suggestion that he was in the hospital last month with cellulitis of the arm I will need to check this out thoroughly. He tells me that he received 2 courses of antibiotics last month were not really sure which ones they were but they are completed now. He is not really having any discomfort. His sister who lives nearby is dressing this with what sounds like Coban. I looked through [facility] health Callahan. He was in the hospital about 3 weeks ago for an infection of the right elbow. Prior to that he had been in an urgent care receiving  Keflex. I do not think he was admitted to hospital but may have spent some time in the ER. I do not see any cultures. 3/11; patient presents for follow-up. He has been using Hydrofera Blue to the right elbow. The wound has healed. Readmission: 09-01-2022 upon evaluation today patient presents for reevaluation here in the clinic he actually was previously seen for his elbow today he comes in with the elbow okay but unfortunately a right gluteal abscess. Unfortunately he tells me that this is somewhat painful. He was seen in the ER they gave him doxycycline he has not picked that up yet that was just yesterday. Larry Callahan ([id number]) [id number] Page 2 of 8 09-08-2022 upon evaluation today patient's wound actually appears to be excellent. In fact he called in stating that was bleeding profusely have a note in communications concerning that. Nonetheless the home health nurse and reassured me he was doing okay and this appears to be doing just fine today as well. I think it is actually showing less pressure compared to last time I saw him as far as the overall surrounding tissue and how things appear. 09-15-2022 upon evaluation today patient's wound unfortunately appears to be infected. I do believe that he is going to require intervention as far as antibiotics are concerned I am actually going to grab a wound culture today as well. 09-22-2022 upon evaluation today patient appears to be doing better in regard to his wound and actually very pleased with where things stand. Fortunately I do think that he is moving in the right direction. No fevers, chills, nausea, vomiting, or diarrhea. 09-30-2022 upon evaluation today patient appears to be doing well currently in regard to his wound. This is actually showing signs of improvement which is great news. I am very pleased with where we stand. I do not see any  evidence of active infection locally nor systemically which is great news.  The patient in general tells me that he is still having some discomfort but this is not nearly as much as it was in the beginning. Overall I do believe that we are moving in the right direction. 10-07-2022 upon evaluation today patient appears to be doing well currently in regard to his wound which is showing signs of improvement. Fortunately I do not see any evidence of active infection locally nor systemically which is great news. No fevers, chills, nausea, vomiting, or diarrhea. 10-17-2022 upon evaluation today patient appears to be doing well currently in regard to his wound though he continues to have fecal material in and around the dressing and wound. This is not good for the healing and may in fact be slowing things down quite a bit. 10-25-2022 upon evaluation today patient's wound actually appears to be making progress which is good news. Fortunately I do not see any signs of infection and the wound is measuring smaller. I do not feel there is any evidence of active infection locally nor systemically at this point. 11-01-2022 upon evaluation today patient appears to be doing well currently in regard to his wound which is actually showing signs of improvement. Fortunately there does not appear to be any evidence of active infection locally nor systemically which is great news. No fevers, chills, nausea, vomiting, or diarrhea. 11-10-2022 upon evaluation today patient appears to be doing well currently in regard to his wound in the gluteal region which is making progress here. Fortunately I do not see any signs of infection he is measuring smaller and again I felt removing in the right way here. In regard to his finger this is a new injury that he cannot even tell me how he injured this. This is of concern for sure but again I am not exactly sure the etiology behind what happened he feels he may have "hit it on something" nonetheless I really do not know although it does not look bruised. We have  gotten a note from the home health nurse stating that he had shut it in a door. Nonetheless the patient actually denies this. I really do not know what happened here. 11-17-2022 upon evaluation today patient appears to be doing well currently in regard to his wounds. Fortunately there does not appear to be any signs of active infection at this time. The finger is looking better and the wound in the gluteal region is also doing better. In general I think that we will make an some good progress here. 11-24-2022 upon evaluation today patient appears to be doing well with regard to the wound in the gluteal region. This is on the right. With that being said he does require some debridement regards to the finger there is a little bit of necrotic tissue with an x-ray of that away. 12-01-2022 upon evaluation patient appears to be doing well currently in regard to his wounds. He is actually showing signs of excellent improvement which is great news and in general I do think that we are moving in the right direction here. I do not see any signs of active infection locally nor systemically which is great news. Unfortunately the T egaderm did not stay in place he is going back to the bordered gauze dressing. Electronic Signature(s) Signed: 12/01/2022 12:31:20 PM By: Larry Derry PA-C Entered By: Larry Callahan on 12/01/2022 12:31:20 -------------------------------------------------------------------------------- Physical Exam Details Patient Name: Date of Service: Larry Callahan, Larry Callahan MES F.  12/01/2022 9:00 A M Medical Record Number: [id number] Patient Account Number: [id number] Date of Birth/Sex: Treating RN: December 21, 1955 (67 y.o. Larry Callahan Primary Care Provider: Sallyanne Kuster Other Clinician: [name] Referring Provider: Treating Provider/Extender: Weldon Inches Weeks in Treatment: 15 Constitutional Well-nourished and well-hydrated in no acute distress. Respiratory normal breathing  without difficulty. Psychiatric this patient is able to make decisions and demonstrates good insight into disease process. Alert and Oriented x 3. pleasant and cooperative. Notes Larry Callahan, Larry Callahan ([id number]) [id number] Page 3 of 8 Upon inspection patient's wound bed actually showed signs of good granulation epithelization at this point. Fortunately I do not see any signs of worsening overall and I think that the patient is making good headway towards closure very pleased in this regard. Both his finger and the gluteal region look better. Electronic Signature(s) Signed: 12/01/2022 12:31:38 PM By: Larry Derry PA-C Entered By: Larry Callahan on 12/01/2022 12:31:38 -------------------------------------------------------------------------------- Physician Orders Details Patient Name: Date of Service: Larry Callahan MES F. 12/01/2022 9:00 A M Medical Record Number: [id number] Patient Account Number: [id number] Date of Birth/Sex: Treating RN: April 19, 1956 (67 y.o. Larry Callahan Primary Care Provider: Sallyanne Kuster Other Clinician: [name] Referring Provider: Treating Provider/Extender: Weldon Inches Weeks in Treatment: 15 Verbal / Phone Orders: Yes Clinician: Angelina Callahan Read Back and Verified: Yes Diagnosis Coding ICD-10 Coding Code Description L02.31 Cutaneous abscess of buttock L98.492 Non-pressure chronic ulcer of skin of other sites with fat layer exposed S61.211A Laceration without foreign body of left index finger without damage to nail, initial encounter Follow-up Appointments Return Appointment in 1 week. Home Health [facility] for wound care. May utilize formulary equivalent dressing for wound treatment orders unless otherwise specified. Home Health Nurse may visit PRN to address patients wound care needs. Larry Callahan Fax: [phone] Anesthetic (Use 'Patient Medications' Section for Anesthetic Order  Entry) Lidocaine applied to wound bed Edema Control - Lymphedema / Segmental Compressive Device / Other Elevate, Exercise Daily and A void Standing for Long Periods of Time. Elevate legs to the level of the heart and pump ankles as often as possible Elevate leg(s) parallel to the floor when sitting. Wound Treatment Wound #2 - Gluteus Wound Laterality: Right Cleanser: Soap and Water 3 x Per Week/15 Days Discharge Instructions: Gently cleanse wound with antibacterial soap, rinse and pat dry prior to dressing wounds Prim Dressing: Hydrofera Blue Ready Transfer Foam, 2.5x2.5 (in/in) (Generic) 3 x Per Week/15 Days ary Discharge Instructions: Apply Hydrofera Blue Ready to wound bed as directed Secondary Dressing: T Adhesive [employer], 4x4 (in/in) 3 x Per Week/15 Days elfa Discharge Instructions: Apply over dressing to secure in place. Wound #3 - Hand - 2nd Digit Wound Laterality: Left Cleanser: Soap and Water 3 x Per Week/15 Days Discharge Instructions: Gently cleanse wound with antibacterial soap, rinse and pat dry prior to dressing wounds Prim Dressing: Hydrofera Blue Ready Transfer Foam, 2.5x2.5 (in/in) 3 x Per Week/15 Days ary Discharge Instructions: Apply Hydrofera Blue Ready to wound bed as directed Secondary Dressing: Gauze 3 x Per Week/15 Days Discharge Instructions: As directed: dry, moistened with saline or moistened with Dakins Solution Secured With: [employer], Latex-free, Size 5, Small-Head / Shoulder / Thigh 3 x Per Week/15 Days BERTRUM, HELMSTETTER ([id number]) [id number] Page 4 of 8 Discharge Instructions: use size 3 stretch net Electronic Signature(s) Signed: 12/01/2022 5:12:55 PM By: Larry Callahan Signed: 12/01/2022 5:52:14 PM By: Larry Derry PA-C Entered By: Larry Callahan on 12/01/2022 09:50:09 Prescription 12/01/2022 -------------------------------------------------------------------------------- Larry Callahan,  Larry Bottcher  PA-C Patient Name: Provider: 19-Aug-1955 [id number] Date of Birth: NPI#: [name] [id number] Sex: DEA #: [phone] Phone #: License #: UPN: Patient Address: [address] [facility] [place], [place] [address] [facility] [address] [place], [place] [address] [phone] Allergies penicillin Provider's Orders CONTINUE Home Health for wound care. May utilize formulary equivalent dressing for wound treatment orders unless otherwise specified. Home Health Nurse may visit PRN to address patients wound care needs. Larry Callahan Fax: [phone] Hand Signature: Date(s): Electronic Signature(s) Signed: 12/01/2022 5:12:55 PM By: Larry Callahan Signed: 12/01/2022 5:52:14 PM By: Larry Derry PA-C Entered By: Larry Callahan on 12/01/2022 09:50:09 -------------------------------------------------------------------------------- Problem List Details Patient Name: Date of Service: Larry Callahan MES F. 12/01/2022 9:00 A M Medical Record Number: [id number] Patient Account Number: [id number] Date of Birth/Sex: Treating RN: [birth year] (67 y.o. Larry Callahan Primary Care Provider: Sallyanne Kuster Other Clinician: [name] Referring Provider: Treating Provider/Extender: Weldon Inches Weeks in Treatment: [address] [place] F ([id number]) [id number] Page 5 of 8 Active Problems ICD-10 Encounter Code Description Active Date MDM Diagnosis L02.31 Cutaneous abscess of buttock 08/15/2022 No Yes L98.492 Non-pressure chronic ulcer of skin of other sites with fat layer exposed 11/10/2022 No Yes S61.211A Laceration without foreign body of left index finger without damage to nail, 11/10/2022 No Yes initial encounter Inactive Problems Resolved Problems Electronic Signature(s) Signed: 12/01/2022 8:50:05 AM By: Larry Derry PA-C Entered By: Larry Callahan on 12/01/2022  08:50:05 -------------------------------------------------------------------------------- Progress Note Details Patient Name: Date of Service: Larry Callahan MES F. 12/01/2022 9:00 A M Medical Record Number: [id number] Patient Account Number: [id number] Date of Birth/Sex: Treating RN: 10-11-1955 (67 y.o. Larry Callahan Primary Care Provider: Sallyanne Kuster Other Clinician: [name] Referring Provider: Treating Provider/Extender: Weldon Inches Weeks in Treatment: 15 Subjective Chief Complaint Information obtained from Patient Right gluteal abscess and Left 2nd finger ulcer History of Present Illness (HPI) ADMISSION 08/15/2022 This is a 67 year old man who came in with very little medical information. There was some suggestion that he was in the hospital last month with cellulitis of the arm I will need to check this out thoroughly. He tells me that he received 2 courses of antibiotics last month were not really sure which ones they were but they are completed now. He is not really having any discomfort. His sister who lives nearby is dressing this with what sounds like Coban. I looked through [facility] Callahan. He was in the hospital about 3 weeks ago for an infection of the right elbow. Prior to that he had been in an urgent care receiving Keflex. I do not think he was admitted to hospital but may have spent some time in the ER. I do not see any cultures. 3/11; patient presents for follow-up. He has been using Hydrofera Blue to the right elbow. The wound has healed. Readmission: 09-01-2022 upon evaluation today patient presents for reevaluation here in the clinic he actually was previously seen for his elbow today he comes in with the elbow okay but unfortunately a right gluteal abscess. Unfortunately he tells me that this is somewhat painful. He was seen in the ER they gave him doxycycline he has not picked that up yet that was just yesterday. 09-08-2022 upon  evaluation today patient's wound actually appears to be excellent. In fact he called in stating that was bleeding profusely have a note in communications concerning that. Nonetheless the  home health nurse and reassured me he was doing okay and this appears to be doing just fine today as well. Larry Callahan, Larry Callahan ([id number]) [id number] Page 6 of 8 I think it is actually showing less pressure compared to last time I saw him as far as the overall surrounding tissue and how things appear. 09-15-2022 upon evaluation today patient's wound unfortunately appears to be infected. I do believe that he is going to require intervention as far as antibiotics are concerned I am actually going to grab a wound culture today as well. 09-22-2022 upon evaluation today patient appears to be doing better in regard to his wound and actually very pleased with where things stand. Fortunately I do think that he is moving in the right direction. No fevers, chills, nausea, vomiting, or diarrhea. 09-30-2022 upon evaluation today patient appears to be doing well currently in regard to his wound. This is actually showing signs of improvement which is great news. I am very pleased with where we stand. I do not see any evidence of active infection locally nor systemically which is great news. The patient in general tells me that he is still having some discomfort but this is not nearly as much as it was in the beginning. Overall I do believe that we are moving in the right direction. 10-07-2022 upon evaluation today patient appears to be doing well currently in regard to his wound which is showing signs of improvement. Fortunately I do not see any evidence of active infection locally nor systemically which is great news. No fevers, chills, nausea, vomiting, or diarrhea. 10-17-2022 upon evaluation today patient appears to be doing well currently in regard to his wound though he continues to have fecal material in and  around the dressing and wound. This is not good for the healing and may in fact be slowing things down quite a bit. 10-25-2022 upon evaluation today patient's wound actually appears to be making progress which is good news. Fortunately I do not see any signs of infection and the wound is measuring smaller. I do not feel there is any evidence of active infection locally nor systemically at this point. 11-01-2022 upon evaluation today patient appears to be doing well currently in regard to his wound which is actually showing signs of improvement. Fortunately there does not appear to be any evidence of active infection locally nor systemically which is great news. No fevers, chills, nausea, vomiting, or diarrhea. 11-10-2022 upon evaluation today patient appears to be doing well currently in regard to his wound in the gluteal region which is making progress here. Fortunately I do not see any signs of infection he is measuring smaller and again I felt removing in the right way here. In regard to his finger this is a new injury that he cannot even tell me how he injured this. This is of concern for sure but again I am not exactly sure the etiology behind what happened he feels he may have "hit it on something" nonetheless I really do not know although it does not look bruised. We have gotten a note from the home health nurse stating that he had shut it in a door. Nonetheless the patient actually denies this. I really do not know what happened here. 11-17-2022 upon evaluation today patient appears to be doing well currently in regard to his wounds. Fortunately there does not appear to be any signs of active infection at this time. The finger is looking better and the wound in the gluteal  region is also doing better. In general I think that we will make an some good progress here. 11-24-2022 upon evaluation today patient appears to be doing well with regard to the wound in the gluteal region. This is on the right.  With that being said he does require some debridement regards to the finger there is a little bit of necrotic tissue with an x-ray of that away. 12-01-2022 upon evaluation patient appears to be doing well currently in regard to his wounds. He is actually showing signs of excellent improvement which is great news and in general I do think that we are moving in the right direction here. I do not see any signs of active infection locally nor systemically which is great news. Unfortunately the T egaderm did not stay in place he is going back to the bordered gauze dressing. Objective Constitutional Well-nourished and well-hydrated in no acute distress. Vitals Time Taken: 8:59 AM, Height: 71 in, Weight: 229 lbs, BMI: 31.9, Temperature: 97.6 F, Pulse: 69 bpm, Respiratory Rate: 18 breaths/min, Blood Pressure: 134/79 mmHg. Respiratory normal breathing without difficulty. Psychiatric this patient is able to make decisions and demonstrates good insight into disease process. Alert and Oriented x 3. pleasant and cooperative. General Notes: Upon inspection patient's wound bed actually showed signs of good granulation epithelization at this point. Fortunately I do not see any signs of worsening overall and I think that the patient is making good headway towards closure very pleased in this regard. Both his finger and the gluteal region look better. Integumentary (Hair, Skin) Wound #2 status is Open. Original cause of wound was Gradually Appeared. The date acquired was: 08/29/2022. The wound has been in treatment 13 weeks. The wound is located on the Right Gluteus. The wound measures 0.6cm length x 0.8cm width x 0.2cm depth; 0.377cm^2 area and 0.075cm^3 volume. There is Fat Layer (Subcutaneous Tissue) exposed. There is a medium amount of serosanguineous drainage noted. There is large (67-100%) red granulation within the wound bed. There is no necrotic tissue within the wound bed. Wound #3 status is Open.  Original cause of wound was Blister. The date acquired was: 11/07/2022. The wound has been in treatment 3 weeks. The wound is located on the Left Hand - 2nd Digit. The wound measures 1cm length x 1.4cm width x 0.1cm depth; 1.1cm^2 area and 0.11cm^3 volume. There is Fat Layer (Subcutaneous Tissue) exposed. There is a medium amount of serosanguineous drainage noted. There is medium (34-66%) pink, pale granulation within the wound bed. Assessment Active Problems ICD-10 Cutaneous abscess of buttock Larry Callahan, Larry Callahan ([id number]) [id number] Page 7 of 8 Non-pressure chronic ulcer of skin of other sites with fat layer exposed Laceration without foreign body of left index finger without damage to nail, initial encounter Plan Follow-up Appointments: Return Appointment in 1 week. Home Health: [facility] for wound care. May utilize formulary equivalent dressing for wound treatment orders unless otherwise specified. Home Health Nurse may visit PRN to address patients wound care needs. Larry Callahan Fax: [phone] Anesthetic (Use 'Patient Medications' Section for Anesthetic Order Entry): Lidocaine applied to wound bed Edema Control - Lymphedema / Segmental Compressive Device / Other: Elevate, Exercise Daily and Avoid Standing for Long Periods of Time. Elevate legs to the level of the heart and pump ankles as often as possible Elevate leg(s) parallel to the floor when sitting. WOUND #2: - Gluteus Wound Laterality: Right Cleanser: Soap and Water 3 x Per Week/15 Days Discharge Instructions: Gently cleanse wound with antibacterial soap, rinse and  pat dry prior to dressing wounds Prim Dressing: Hydrofera Blue Ready Transfer Foam, 2.5x2.5 (in/in) (Generic) 3 x Per Week/15 Days ary Discharge Instructions: Apply Hydrofera Blue Ready to wound bed as directed Secondary Dressing: T Adhesive [employer], 4x4 (in/in) 3 x Per Week/15 Days elfa Discharge Instructions: Apply  over dressing to secure in place. WOUND #3: - Hand - 2nd Digit Wound Laterality: Left Cleanser: Soap and Water 3 x Per Week/15 Days Discharge Instructions: Gently cleanse wound with antibacterial soap, rinse and pat dry prior to dressing wounds Prim Dressing: Hydrofera Blue Ready Transfer Foam, 2.5x2.5 (in/in) 3 x Per Week/15 Days ary Discharge Instructions: Apply Hydrofera Blue Ready to wound bed as directed Secondary Dressing: Gauze 3 x Per Week/15 Days Discharge Instructions: As directed: dry, moistened with saline or moistened with Dakins Solution Secured With: [employer] Dressing, Latex-free, Size 5, Small-Head / Shoulder / Thigh 3 x Per Week/15 Days Discharge Instructions: use size 3 stretch net 1. I would recommend that we have the patient continue with [facility] ready and using this on both locations currently. Fortunately I think he is really making good progress. 2. I am good recommend as well that we have the patient continue to monitor for any signs of infection or worsening overall if anything changes he should let me know. We are going to try to have him keep the wound dressing in the gluteal area more clean as he does often get feces over this area and unfortunately cannot changes himself that is why we are trying Tylenol but I just did not stay in place. We will see patient back for reevaluation in 1 week here in the clinic. If anything worsens or changes patient will contact our office for additional recommendations. Electronic Signature(s) Signed: 12/01/2022 12:32:18 PM By: Larry Derry PA-C Entered By: Larry Callahan on 12/01/2022 12:32:18 -------------------------------------------------------------------------------- SuperBill Details Patient Name: Date of Service: Tracie Harrier, Larry Callahan MES F. 12/01/2022 Medical Record Number: [id number] Patient Account Number: [id number] Date of Birth/Sex: Treating RN: 07/01/1955 (67 y.o. Larry Callahan Primary Care Provider: Sallyanne Kuster Other Clinician: [name] Referring Provider: Treating Provider/Extender: Weldon Inches Weeks in Treatment: 15 Diagnosis Coding ICD-10 Codes Code Description EBAN, WEICK ([id number]) [id number] Page 8 of 8 L02.31 Cutaneous abscess of buttock L98.492 Non-pressure chronic ulcer of skin of other sites with fat layer exposed S61.211A Laceration without foreign body of left index finger without damage to nail, initial encounter Facility Procedures : CPT4 Code: [id number] Description: 99213 - WOUND CARE VISIT-LEV 3 EST PT Modifier: Quantity: 1 Physician Procedures : CPT4 Code Description Modifier [id number] 99213 - WC PHYS LEVEL 3 - EST PT ICD-10 Diagnosis Description L02.31 Cutaneous abscess of buttock L98.492 Non-pressure chronic ulcer of skin of other sites with fat layer exposed S61.211A Laceration without  foreign body of left index finger without damage to nail, initial encounter Quantity: 1 Electronic Signature(s) Signed: 12/01/2022 12:32:46 PM By: Larry Derry PA-C Entered By: Larry Callahan on 12/01/2022 12:32:45

## 2022-12-02 DIAGNOSIS — I2510 Atherosclerotic heart disease of native coronary artery without angina pectoris: Secondary | ICD-10-CM | POA: Diagnosis not present

## 2022-12-02 DIAGNOSIS — I110 Hypertensive heart disease with heart failure: Secondary | ICD-10-CM | POA: Diagnosis not present

## 2022-12-02 DIAGNOSIS — G4733 Obstructive sleep apnea (adult) (pediatric): Secondary | ICD-10-CM | POA: Diagnosis not present

## 2022-12-02 DIAGNOSIS — Z602 Problems related to living alone: Secondary | ICD-10-CM | POA: Diagnosis not present

## 2022-12-02 DIAGNOSIS — E785 Hyperlipidemia, unspecified: Secondary | ICD-10-CM | POA: Diagnosis not present

## 2022-12-02 DIAGNOSIS — I509 Heart failure, unspecified: Secondary | ICD-10-CM | POA: Diagnosis not present

## 2022-12-02 DIAGNOSIS — J449 Chronic obstructive pulmonary disease, unspecified: Secondary | ICD-10-CM | POA: Diagnosis not present

## 2022-12-02 DIAGNOSIS — E119 Type 2 diabetes mellitus without complications: Secondary | ICD-10-CM | POA: Diagnosis not present

## 2022-12-02 DIAGNOSIS — L0231 Cutaneous abscess of buttock: Secondary | ICD-10-CM | POA: Diagnosis not present

## 2022-12-05 DIAGNOSIS — G4733 Obstructive sleep apnea (adult) (pediatric): Secondary | ICD-10-CM | POA: Diagnosis not present

## 2022-12-05 DIAGNOSIS — I110 Hypertensive heart disease with heart failure: Secondary | ICD-10-CM | POA: Diagnosis not present

## 2022-12-05 DIAGNOSIS — Z602 Problems related to living alone: Secondary | ICD-10-CM | POA: Diagnosis not present

## 2022-12-05 DIAGNOSIS — E119 Type 2 diabetes mellitus without complications: Secondary | ICD-10-CM | POA: Diagnosis not present

## 2022-12-05 DIAGNOSIS — I2510 Atherosclerotic heart disease of native coronary artery without angina pectoris: Secondary | ICD-10-CM | POA: Diagnosis not present

## 2022-12-05 DIAGNOSIS — L0231 Cutaneous abscess of buttock: Secondary | ICD-10-CM | POA: Diagnosis not present

## 2022-12-05 DIAGNOSIS — E785 Hyperlipidemia, unspecified: Secondary | ICD-10-CM | POA: Diagnosis not present

## 2022-12-05 DIAGNOSIS — I509 Heart failure, unspecified: Secondary | ICD-10-CM | POA: Diagnosis not present

## 2022-12-05 DIAGNOSIS — J449 Chronic obstructive pulmonary disease, unspecified: Secondary | ICD-10-CM | POA: Diagnosis not present

## 2022-12-07 DIAGNOSIS — E785 Hyperlipidemia, unspecified: Secondary | ICD-10-CM | POA: Diagnosis not present

## 2022-12-07 DIAGNOSIS — I509 Heart failure, unspecified: Secondary | ICD-10-CM | POA: Diagnosis not present

## 2022-12-07 DIAGNOSIS — Z602 Problems related to living alone: Secondary | ICD-10-CM | POA: Diagnosis not present

## 2022-12-07 DIAGNOSIS — G4733 Obstructive sleep apnea (adult) (pediatric): Secondary | ICD-10-CM | POA: Diagnosis not present

## 2022-12-07 DIAGNOSIS — I2510 Atherosclerotic heart disease of native coronary artery without angina pectoris: Secondary | ICD-10-CM | POA: Diagnosis not present

## 2022-12-07 DIAGNOSIS — E119 Type 2 diabetes mellitus without complications: Secondary | ICD-10-CM | POA: Diagnosis not present

## 2022-12-07 DIAGNOSIS — J449 Chronic obstructive pulmonary disease, unspecified: Secondary | ICD-10-CM | POA: Diagnosis not present

## 2022-12-07 DIAGNOSIS — L0231 Cutaneous abscess of buttock: Secondary | ICD-10-CM | POA: Diagnosis not present

## 2022-12-07 DIAGNOSIS — I110 Hypertensive heart disease with heart failure: Secondary | ICD-10-CM | POA: Diagnosis not present

## 2022-12-08 ENCOUNTER — Encounter: Payer: Medicare PPO | Admitting: Physician Assistant

## 2022-12-08 DIAGNOSIS — L0231 Cutaneous abscess of buttock: Secondary | ICD-10-CM | POA: Diagnosis not present

## 2022-12-08 DIAGNOSIS — S61211A Laceration without foreign body of left index finger without damage to nail, initial encounter: Secondary | ICD-10-CM | POA: Diagnosis not present

## 2022-12-08 DIAGNOSIS — E11622 Type 2 diabetes mellitus with other skin ulcer: Secondary | ICD-10-CM | POA: Diagnosis not present

## 2022-12-08 DIAGNOSIS — L98492 Non-pressure chronic ulcer of skin of other sites with fat layer exposed: Secondary | ICD-10-CM | POA: Diagnosis not present

## 2022-12-08 DIAGNOSIS — J449 Chronic obstructive pulmonary disease, unspecified: Secondary | ICD-10-CM | POA: Diagnosis not present

## 2022-12-08 DIAGNOSIS — I2510 Atherosclerotic heart disease of native coronary artery without angina pectoris: Secondary | ICD-10-CM | POA: Diagnosis not present

## 2022-12-08 DIAGNOSIS — E1151 Type 2 diabetes mellitus with diabetic peripheral angiopathy without gangrene: Secondary | ICD-10-CM | POA: Diagnosis not present

## 2022-12-08 DIAGNOSIS — I10 Essential (primary) hypertension: Secondary | ICD-10-CM | POA: Diagnosis not present

## 2022-12-08 NOTE — Progress Notes (Addendum)
PRABHJOT, BEGER ([id number]) [id number] Page 1 of 8 Visit Report for 12/08/2022 Chief Complaint Document Details Patient Name: Date of Service: Larry Link MES F. 12/08/2022 8:15 A M Medical Record Number: [id number] Patient Account Number: [id number] Date of Birth/Sex: Treating RN: [birth year] (67 y.o. Larry Callahan Primary Care Provider: Sallyanne Kuster Other Clinician: Betha Loa Referring Provider: Treating Provider/Extender: Weldon Inches Weeks in Treatment: 16 Information Obtained from: Patient Chief Complaint Right gluteal abscess and Left 2nd finger ulcer Electronic Signature(s) Signed: 12/08/2022 8:46:54 AM By: Allen Derry PA-C Entered By: Allen Derry on 12/08/2022 08:46:54 -------------------------------------------------------------------------------- HPI Details Patient Name: Date of Service: Larry Link MES F. 12/08/2022 8:15 A M Medical Record Number: [id number] Patient Account Number: [id number] Date of Birth/Sex: Treating RN: 1955-10-17 (67 y.o. Larry Callahan Primary Care Provider: Sallyanne Kuster Other Clinician: Betha Loa Referring Provider: Treating Provider/Extender: Weldon Inches Weeks in Treatment: 16 History of Present Illness HPI Description: ADMISSION 08/15/2022 This is a 67 year old man who came in with very little medical information. There was some suggestion that he was in the hospital last month with cellulitis of the arm I will need to check this out thoroughly. He tells me that he received 2 courses of antibiotics last month were not really sure which ones they were but they are completed now. He is not really having any discomfort. His sister who lives nearby is dressing this with what sounds like Coban. I looked through [facility] health link. He was in the hospital about 3 weeks ago for an infection of the right elbow. Prior to that he had been in an urgent care receiving  Keflex. I do not think he was admitted to hospital but may have spent some time in the ER. I do not see any cultures. 3/11; patient presents for follow-up. He has been using Hydrofera Blue to the right elbow. The wound has healed. Readmission: 09-01-2022 upon evaluation today patient presents for reevaluation here in the clinic he actually was previously seen for his elbow today he comes in with the elbow okay but unfortunately a right gluteal abscess. Unfortunately he tells me that this is somewhat painful. He was seen in the ER they gave him doxycycline he has not picked that up yet that was just yesterday. Larry Callahan, Larry Callahan ([id number]) [id number] Page 2 of 8 09-08-2022 upon evaluation today patient's wound actually appears to be excellent. In fact he called in stating that was bleeding profusely have a note in communications concerning that. Nonetheless the home health nurse and reassured me he was doing okay and this appears to be doing just fine today as well. I think it is actually showing less pressure compared to last time I saw him as far as the overall surrounding tissue and how things appear. 09-15-2022 upon evaluation today patient's wound unfortunately appears to be infected. I do believe that he is going to require intervention as far as antibiotics are concerned I am actually going to grab a wound culture today as well. 09-22-2022 upon evaluation today patient appears to be doing better in regard to his wound and actually very pleased with where things stand. Fortunately I do think that he is moving in the right direction. No fevers, chills, nausea, vomiting, or diarrhea. 09-30-2022 upon evaluation today patient appears to be doing well currently in regard to his wound. This is actually showing signs of improvement which is great news. I am very pleased with where we stand. I do not see any  evidence of active infection locally nor systemically which is great news.  The patient in general tells me that he is still having some discomfort but this is not nearly as much as it was in the beginning. Overall I do believe that we are moving in the right direction. 10-07-2022 upon evaluation today patient appears to be doing well currently in regard to his wound which is showing signs of improvement. Fortunately I do not see any evidence of active infection locally nor systemically which is great news. No fevers, chills, nausea, vomiting, or diarrhea. 10-17-2022 upon evaluation today patient appears to be doing well currently in regard to his wound though he continues to have fecal material in and around the dressing and wound. This is not good for the healing and may in fact be slowing things down quite a bit. 10-25-2022 upon evaluation today patient's wound actually appears to be making progress which is good news. Fortunately I do not see any signs of infection and the wound is measuring smaller. I do not feel there is any evidence of active infection locally nor systemically at this point. 11-01-2022 upon evaluation today patient appears to be doing well currently in regard to his wound which is actually showing signs of improvement. Fortunately there does not appear to be any evidence of active infection locally nor systemically which is great news. No fevers, chills, nausea, vomiting, or diarrhea. 11-10-2022 upon evaluation today patient appears to be doing well currently in regard to his wound in the gluteal region which is making progress here. Fortunately I do not see any signs of infection he is measuring smaller and again I felt removing in the right way here. In regard to his finger this is a new injury that he cannot even tell me how he injured this. This is of concern for sure but again I am not exactly sure the etiology behind what happened he feels he may have "hit it on something" nonetheless I really do not know although it does not look bruised. We have  gotten a note from the home health nurse stating that he had shut it in a door. Nonetheless the patient actually denies this. I really do not know what happened here. 11-17-2022 upon evaluation today patient appears to be doing well currently in regard to his wounds. Fortunately there does not appear to be any signs of active infection at this time. The finger is looking better and the wound in the gluteal region is also doing better. In general I think that we will make an some good progress here. 11-24-2022 upon evaluation today patient appears to be doing well with regard to the wound in the gluteal region. This is on the right. With that being said he does require some debridement regards to the finger there is a little bit of necrotic tissue with an x-ray of that away. 12-01-2022 upon evaluation patient appears to be doing well currently in regard to his wounds. He is actually showing signs of excellent improvement which is great news and in general I do think that we are moving in the right direction here. I do not see any signs of active infection locally nor systemically which is great news. Unfortunately the T egaderm did not stay in place he is going back to the bordered gauze dressing. 12-08-2022 upon evaluation today patient actually appears to be doing excellent in fact he is showing signs of great improvement and very pleased with where we stand I do believe that  we are moving in the right direction here. No fevers, chills, nausea, vomiting, or diarrhea. Electronic Signature(s) Signed: 12/08/2022 8:50:48 AM By: Allen Derry PA-C Entered By: Allen Derry on 12/08/2022 08:50:48 -------------------------------------------------------------------------------- Physical Exam Details Patient Name: Date of Service: Larry Link MES F. 12/08/2022 8:15 A M Medical Record Number: [id number] Patient Account Number: [id number] Date of Birth/Sex: Treating RN: [birth year] (67 y.o. Larry Callahan Primary  Care Provider: Sallyanne Kuster Other Clinician: Betha Loa Referring Provider: Treating Provider/Extender: Weldon Inches Weeks in Treatment: 16 Constitutional Well-nourished and well-hydrated in no acute distress. Respiratory normal breathing without difficulty. Psychiatric this patient is able to make decisions and demonstrates good insight into disease process. Alert and Oriented x 3. pleasant and cooperative. Larry Callahan, Larry Callahan ([id number]) [id number] Page 3 of 8 Notes Upon inspection patient's wound bed actually showed signs of good granulation epithelization at this point. Fortunately I think his finger is healed and I think that the area in the gluteal region is almost closed as well and showing signs of excellent improvement. I am very pleased with where we stand today. Electronic Signature(s) Signed: 12/08/2022 8:51:05 AM By: Allen Derry PA-C Entered By: Allen Derry on 12/08/2022 08:51:05 -------------------------------------------------------------------------------- Physician Orders Details Patient Name: Date of Service: Larry Link MES F. 12/08/2022 8:15 A M Medical Record Number: [id number] Patient Account Number: [id number] Date of Birth/Sex: Treating RN: Sep 29, 1955 (67 y.o. Larry Callahan Primary Care Provider: Sallyanne Kuster Other Clinician: Betha Loa Referring Provider: Treating Provider/Extender: Weldon Inches Weeks in Treatment: 103 Verbal / Phone Orders: Yes Clinician: Angelina Pih Read Back and Verified: Yes Diagnosis Coding ICD-10 Coding Code Description L02.31 Cutaneous abscess of buttock L98.492 Non-pressure chronic ulcer of skin of other sites with fat layer exposed S61.211A Laceration without foreign body of left index finger without damage to nail, initial encounter Follow-up Appointments Return Appointment in 1 week. Home Health [facility] for wound care. May  utilize formulary equivalent dressing for wound treatment orders unless otherwise specified. Home Health Nurse may visit PRN to address patients wound care needs. Rolene Arbour Fax: [phone] Anesthetic (Use 'Patient Medications' Section for Anesthetic Order Entry) Lidocaine applied to wound bed Edema Control - Lymphedema / Segmental Compressive Device / Other Elevate, Exercise Daily and A void Standing for Long Periods of Time. Elevate legs to the level of the heart and pump ankles as often as possible Elevate leg(s) parallel to the floor when sitting. Wound Treatment Wound #2 - Gluteus Wound Laterality: Right Cleanser: Soap and Water 3 x Per Week/15 Days Discharge Instructions: Gently cleanse wound with antibacterial soap, rinse and pat dry prior to dressing wounds Prim Dressing: Hydrofera Blue Ready Transfer Foam, 2.5x2.5 (in/in) (Generic) 3 x Per Week/15 Days ary Discharge Instructions: Apply Hydrofera Blue Ready to wound bed as directed Secondary Dressing: T Adhesive [employer], 4x4 (in/in) 3 x Per Week/15 Days elfa Discharge Instructions: Apply over dressing to secure in place. Electronic Signature(s) Signed: 12/09/2022 11:45:42 AM By: Betha Loa Signed: 12/09/2022 1:40:48 PM By: Allen Derry PA-C Entered By: Betha Loa on 12/08/2022 08:49:46 Larry Callahan ([id number]) [id number] Page 4 of 8 Prescription 12/08/2022 -------------------------------------------------------------------------------- Larry Mort PA-C Patient Name: Provider: 09/14/1955 [id number] Date of Birth: NPI#: [name] [id number] Sex: DEA #: [phone] Phone #: License #: UPN: Patient Address: [address] [facility] [place], [place] [address] [facility] [address], [address] [place], [place] [address] [phone] Allergies penicillin Provider's  Orders CONTINUE Home Health  for wound care. May utilize formulary equivalent dressing for wound treatment orders unless otherwise specified. Home Health Nurse may visit PRN to address patients wound care needs. Rolene Arbour Fax: [phone] Hand Signature: Date(s): Electronic Signature(s) Signed: 12/09/2022 11:45:42 AM By: Betha Loa Signed: 12/09/2022 1:40:48 PM By: Allen Derry PA-C Entered By: Betha Loa on 12/08/2022 08:49:46 -------------------------------------------------------------------------------- Problem List Details Patient Name: Date of Service: Larry Link MES F. 12/08/2022 8:15 A M Medical Record Number: [id number] Patient Account Number: [id number] Date of Birth/Sex: Treating RN: March 16, 1956 (67 y.o. Larry Callahan Primary Care Provider: Sallyanne Kuster Other Clinician: Betha Loa Referring Provider: Treating Provider/Extender: Weldon Inches Weeks in Treatment: 16 Active Problems ICD-10 Encounter Code Description Active Date MDM Diagnosis L02.31 Cutaneous abscess of buttock 08/15/2022 No Yes Larry Callahan, Larry Callahan ([id number]) [id number] Page 5 of 8 [phone] Non-pressure chronic ulcer of skin of other sites with fat layer exposed 11/10/2022 No Yes S61.211A Laceration without foreign body of left index finger without damage to nail, 11/10/2022 No Yes initial encounter Inactive Problems Resolved Problems Electronic Signature(s) Signed: 12/08/2022 8:46:51 AM By: Allen Derry PA-C Entered By: Allen Derry on 12/08/2022 08:46:51 -------------------------------------------------------------------------------- Progress Note Details Patient Name: Date of Service: Larry Link MES F. 12/08/2022 8:15 A M Medical Record Number: [id number] Patient Account Number: [id number] Date of Birth/Sex: Treating RN: Jan 24, 1956 (67 y.o. Larry Callahan Primary Care Provider: Sallyanne Kuster Other Clinician: Betha Loa Referring Provider: Treating  Provider/Extender: Weldon Inches Weeks in Treatment: 16 Subjective Chief Complaint Information obtained from Patient Right gluteal abscess and Left 2nd finger ulcer History of Present Illness (HPI) ADMISSION 08/15/2022 This is a 67 year old man who came in with very little medical information. There was some suggestion that he was in the hospital last month with cellulitis of the arm I will need to check this out thoroughly. He tells me that he received 2 courses of antibiotics last month were not really sure which ones they were but they are completed now. He is not really having any discomfort. His sister who lives nearby is dressing this with what sounds like Coban. I looked through [facility] health link. He was in the hospital about 3 weeks ago for an infection of the right elbow. Prior to that he had been in an urgent care receiving Keflex. I do not think he was admitted to hospital but may have spent some time in the ER. I do not see any cultures. 3/11; patient presents for follow-up. He has been using Hydrofera Blue to the right elbow. The wound has healed. Readmission: 09-01-2022 upon evaluation today patient presents for reevaluation here in the clinic he actually was previously seen for his elbow today he comes in with the elbow okay but unfortunately a right gluteal abscess. Unfortunately he tells me that this is somewhat painful. He was seen in the ER they gave him doxycycline he has not picked that up yet that was just yesterday. 09-08-2022 upon evaluation today patient's wound actually appears to be excellent. In fact he called in stating that was bleeding profusely have a note in communications concerning that. Nonetheless the home health nurse and reassured me he was doing okay and this appears to be doing just fine today as well. I think it is actually showing less pressure compared to last time I saw him as far as the overall surrounding tissue and how things  appear. 09-15-2022 upon evaluation today patient's wound unfortunately appears to be infected. I do  believe that he is going to require intervention as far as antibiotics are concerned I am actually going to grab a wound culture today as well. 09-22-2022 upon evaluation today patient appears to be doing better in regard to his wound and actually very pleased with where things stand. Fortunately I do think that he is moving in the right direction. No fevers, chills, nausea, vomiting, or diarrhea. 09-30-2022 upon evaluation today patient appears to be doing well currently in regard to his wound. This is actually showing signs of improvement which is great news. I am very pleased with where we stand. I do not see any evidence of active infection locally nor systemically which is great news. The patient in general tells me that he is still having some discomfort but this is not nearly as much as it was in the beginning. Overall I do believe that we are moving in the right Larry Callahan, Larry Callahan ([id number]) [id number] Page 6 of 8 direction. 10-07-2022 upon evaluation today patient appears to be doing well currently in regard to his wound which is showing signs of improvement. Fortunately I do not see any evidence of active infection locally nor systemically which is great news. No fevers, chills, nausea, vomiting, or diarrhea. 10-17-2022 upon evaluation today patient appears to be doing well currently in regard to his wound though he continues to have fecal material in and around the dressing and wound. This is not good for the healing and may in fact be slowing things down quite a bit. 10-25-2022 upon evaluation today patient's wound actually appears to be making progress which is good news. Fortunately I do not see any signs of infection and the wound is measuring smaller. I do not feel there is any evidence of active infection locally nor systemically at this point. 11-01-2022 upon  evaluation today patient appears to be doing well currently in regard to his wound which is actually showing signs of improvement. Fortunately there does not appear to be any evidence of active infection locally nor systemically which is great news. No fevers, chills, nausea, vomiting, or diarrhea. 11-10-2022 upon evaluation today patient appears to be doing well currently in regard to his wound in the gluteal region which is making progress here. Fortunately I do not see any signs of infection he is measuring smaller and again I felt removing in the right way here. In regard to his finger this is a new injury that he cannot even tell me how he injured this. This is of concern for sure but again I am not exactly sure the etiology behind what happened he feels he may have "hit it on something" nonetheless I really do not know although it does not look bruised. We have gotten a note from the home health nurse stating that he had shut it in a door. Nonetheless the patient actually denies this. I really do not know what happened here. 11-17-2022 upon evaluation today patient appears to be doing well currently in regard to his wounds. Fortunately there does not appear to be any signs of active infection at this time. The finger is looking better and the wound in the gluteal region is also doing better. In general I think that we will make an some good progress here. 11-24-2022 upon evaluation today patient appears to be doing well with regard to the wound in the gluteal region. This is on the right. With that being said he does require some debridement regards to the finger there is a little bit  of necrotic tissue with an x-ray of that away. 12-01-2022 upon evaluation patient appears to be doing well currently in regard to his wounds. He is actually showing signs of excellent improvement which is great news and in general I do think that we are moving in the right direction here. I do not see any signs of active  infection locally nor systemically which is great news. Unfortunately the T egaderm did not stay in place he is going back to the bordered gauze dressing. 12-08-2022 upon evaluation today patient actually appears to be doing excellent in fact he is showing signs of great improvement and very pleased with where we stand I do believe that we are moving in the right direction here. No fevers, chills, nausea, vomiting, or diarrhea. Objective Constitutional Well-nourished and well-hydrated in no acute distress. Vitals Time Taken: 8:34 AM, Height: 71 in, Weight: 229 lbs, BMI: 31.9, Temperature: 97.6 F, Pulse: 97 bpm, Respiratory Rate: 16 breaths/min, Blood Pressure: 145/82 mmHg. Respiratory normal breathing without difficulty. Psychiatric this patient is able to make decisions and demonstrates good insight into disease process. Alert and Oriented x 3. pleasant and cooperative. General Notes: Upon inspection patient's wound bed actually showed signs of good granulation epithelization at this point. Fortunately I think his finger is healed and I think that the area in the gluteal region is almost closed as well and showing signs of excellent improvement. I am very pleased with where we stand today. Integumentary (Hair, Skin) Wound #2 status is Open. Original cause of wound was Gradually Appeared. The date acquired was: 08/29/2022. The wound has been in treatment 14 weeks. The wound is located on the Right Gluteus. The wound measures 0.3cm length x 0.4cm width x 0.2cm depth; 0.094cm^2 area and 0.019cm^3 volume. There is Fat Layer (Subcutaneous Tissue) exposed. There is a medium amount of serosanguineous drainage noted. There is large (67-100%) red granulation within the wound bed. There is no necrotic tissue within the wound bed. Wound #3 status is Healed - Epithelialized. Original cause of wound was Blister. The date acquired was: 11/07/2022. The wound has been in treatment 4 weeks. The wound is located  on the Left Hand - 2nd Digit. The wound measures 0cm length x 0cm width x 0cm depth; 0cm^2 area and 0cm^3 volume. There is Fat Layer (Subcutaneous Tissue) exposed. There is a none present amount of drainage noted. There is no granulation within the wound bed. Assessment Active Problems ICD-10 Cutaneous abscess of buttock Non-pressure chronic ulcer of skin of other sites with fat layer exposed Laceration without foreign body of left index finger without damage to nail, initial encounter Larry Callahan, Larry Callahan ([id number]) [id number] Page 7 of 8 Plan Follow-up Appointments: Return Appointment in 1 week. Home Health: [facility] for wound care. May utilize formulary equivalent dressing for wound treatment orders unless otherwise specified. Home Health Nurse may visit PRN to address patients wound care needs. Rolene Arbour Fax: [phone] Anesthetic (Use 'Patient Medications' Section for Anesthetic Order Entry): Lidocaine applied to wound bed Edema Control - Lymphedema / Segmental Compressive Device / Other: Elevate, Exercise Daily and Avoid Standing for Long Periods of Time. Elevate legs to the level of the heart and pump ankles as often as possible Elevate leg(s) parallel to the floor when sitting. WOUND #2: - Gluteus Wound Laterality: Right Cleanser: Soap and Water 3 x Per Week/15 Days Discharge Instructions: Gently cleanse wound with antibacterial soap, rinse and pat dry prior to dressing wounds Prim Dressing: Hydrofera Blue Ready Transfer Foam, 2.5x2.5 (  in/in) (Generic) 3 x Per Week/15 Days ary Discharge Instructions: Apply Hydrofera Blue Ready to wound bed as directed Secondary Dressing: T Adhesive [employer], 4x4 (in/in) 3 x Per Week/15 Days elfa Discharge Instructions: Apply over dressing to secure in place. 1. I would recommend that we have the patient continue to monitor for any evidence of infection or worsening. Based on what I am seeing I do  believe that we are moving in the right direction. 2. Also can recommend that we continue with the [facility] which I think is doing really well. 3. I would also suggest that we continue with the T island dressing this is only thing that really stays on at all although it does get soiled he is doing better elfa about keeping it clean and again the wound is almost done. We will see patient back for reevaluation in 1 week here in the clinic. If anything worsens or changes patient will contact our office for additional recommendations. Electronic Signature(s) Signed: 12/08/2022 8:51:41 AM By: Allen Derry PA-C Entered By: Allen Derry on 12/08/2022 08:51:40 -------------------------------------------------------------------------------- SuperBill Details Patient Name: Date of Service: Larry Callahan, Larry Callahan MES F. 12/08/2022 Medical Record Number: [id number] Patient Account Number: [id number] Date of Birth/Sex: Treating RN: 10-Nov-1955 (67 y.o. Larry Callahan Primary Care Provider: Sallyanne Kuster Other Clinician: Betha Loa Referring Provider: Treating Provider/Extender: Weldon Inches Weeks in Treatment: 16 Diagnosis Coding ICD-10 Codes Code Description L02.31 Cutaneous abscess of buttock L98.492 Non-pressure chronic ulcer of skin of other sites with fat layer exposed S61.211A Laceration without foreign body of left index finger without damage to nail, initial encounter Facility Procedures : CPT4 Code: [id number] Description: 99213 - WOUND CARE VISIT-LEV 3 EST PT Modifier: Quantity: 1 Physician Procedures : CPT4 Code Description Modifier [id number] 99213 - WC PHYS LEVEL 3 - EST PT ICD-10 Diagnosis Description Larry Callahan, Larry Callahan ([id number]) [id number] Pa L02.31 Cutaneous abscess of buttock L98.492 Non-pressure chronic ulcer of skin  of other sites with fat layer exposed S61.211A Laceration without foreign body of left index finger without damage  to nail, initial encounter Quantity: 1 ge 8 of 8 Electronic Signature(s) Signed: 12/08/2022 8:51:50 AM By: Allen Derry PA-C Entered By: Allen Derry on 12/08/2022 08:51:50

## 2022-12-09 DIAGNOSIS — E785 Hyperlipidemia, unspecified: Secondary | ICD-10-CM | POA: Diagnosis not present

## 2022-12-09 DIAGNOSIS — I509 Heart failure, unspecified: Secondary | ICD-10-CM | POA: Diagnosis not present

## 2022-12-09 DIAGNOSIS — L0231 Cutaneous abscess of buttock: Secondary | ICD-10-CM | POA: Diagnosis not present

## 2022-12-09 DIAGNOSIS — Z602 Problems related to living alone: Secondary | ICD-10-CM | POA: Diagnosis not present

## 2022-12-09 DIAGNOSIS — G4733 Obstructive sleep apnea (adult) (pediatric): Secondary | ICD-10-CM | POA: Diagnosis not present

## 2022-12-09 DIAGNOSIS — I2510 Atherosclerotic heart disease of native coronary artery without angina pectoris: Secondary | ICD-10-CM | POA: Diagnosis not present

## 2022-12-09 DIAGNOSIS — J449 Chronic obstructive pulmonary disease, unspecified: Secondary | ICD-10-CM | POA: Diagnosis not present

## 2022-12-09 DIAGNOSIS — E119 Type 2 diabetes mellitus without complications: Secondary | ICD-10-CM | POA: Diagnosis not present

## 2022-12-09 DIAGNOSIS — I110 Hypertensive heart disease with heart failure: Secondary | ICD-10-CM | POA: Diagnosis not present

## 2022-12-09 NOTE — Progress Notes (Signed)
AMEER, CABALLEROS ([id number]) [id number] Page 1 of 10 Visit Report for 12/08/2022 Arrival Information Details Patient Name: Date of Service: Larry Link MES F. 12/08/2022 8:15 A M Medical Record Number: [id number] Patient Account Number: [id number] Date of Birth/Sex: Treating RN: [birth year] (67 y.o. Larry Callahan Primary Care Talon Regala: Sallyanne Kuster Other Clinician: Betha Loa Referring Johnathyn Viscomi: Treating Kimberleigh Mehan/Extender: Weldon Inches Weeks in Treatment: 16 Visit Information History Since Last Visit All ordered tests and consults were completed: No Patient Arrived: Ambulatory Added or deleted any medications: No Arrival Time: 08:33 Any new allergies or adverse reactions: No Transfer Assistance: None Had a fall or experienced change in No Patient Identification Verified: Yes activities of daily living that may affect Secondary Verification Process Completed: Yes risk of falls: Patient Requires Transmission-Based Precautions: No Signs or symptoms of abuse/neglect since last visito No Patient Has Alerts: Yes Hospitalized since last visit: No Patient Alerts: Patient on Blood Thinner Implantable device outside of the clinic excluding No cellular tissue based products placed in the center since last visit: Has Dressing in Place as Prescribed: Yes Pain Present Now: No Electronic Signature(s) Signed: 12/09/2022 11:45:42 AM By: Betha Loa Entered By: Betha Loa on 12/08/2022 08:33:53 -------------------------------------------------------------------------------- Clinic Level of Care Assessment Details Patient Name: Date of Service: Larry Link MES F. 12/08/2022 8:15 A M Medical Record Number: [id number] Patient Account Number: [id number] Date of Birth/Sex: Treating RN: 1956/05/04 (67 y.o. Larry Callahan Primary Care Darlene Brozowski: Sallyanne Kuster Other Clinician: Betha Loa Referring Bruce Mayers: Treating  Rogena Deupree/Extender: Weldon Inches Weeks in Treatment: 16 Clinic Level of Care Assessment Items TOOL 4 Quantity Score []  - 0 Use when only an EandM is performed on FOLLOW-UP visit ASSESSMENTS - Nursing Assessment / Reassessment X- 1 10 Reassessment of Co-morbidities (includes updates in patient status) X- 1 5 Reassessment of Adherence to Treatment Plan Larry Callahan, Larry Callahan ([id number]) [id number] Page 2 of 10 ASSESSMENTS - Wound and Skin A ssessment / Reassessment X - Simple Wound Assessment / Reassessment - one wound 1 5 []  - 0 Complex Wound Assessment / Reassessment - multiple wounds []  - 0 Dermatologic / Skin Assessment (not related to wound area) ASSESSMENTS - Focused Assessment []  - 0 Circumferential Edema Measurements - multi extremities []  - 0 Nutritional Assessment / Counseling / Intervention []  - 0 Lower Extremity Assessment (monofilament, tuning fork, pulses) []  - 0 Peripheral Arterial Disease Assessment (using hand held doppler) ASSESSMENTS - Ostomy and/or Continence Assessment and Care []  - 0 Incontinence Assessment and Management []  - 0 Ostomy Care Assessment and Management (repouching, etc.) PROCESS - Coordination of Care X - Simple Patient / Family Education for ongoing care 1 15 []  - 0 Complex (extensive) Patient / Family Education for ongoing care []  - 0 Staff obtains Chiropractor, Records, T Results / Process Orders est []  - 0 Staff telephones HHA, Nursing Homes / Clarify orders / etc []  - 0 Routine Transfer to another Facility (non-emergent condition) []  - 0 Routine Hospital Admission (non-emergent condition) []  - 0 New Admissions / Manufacturing engineer / Ordering NPWT Apligraf, etc. , []  - 0 Emergency Hospital Admission (emergent condition) X- 1 10 Simple Discharge Coordination []  - 0 Complex (extensive) Discharge Coordination PROCESS - Special Needs []  - 0 Pediatric / Minor Patient Management []  -  0 Isolation Patient Management []  - 0 Hearing / Language / Visual special needs []  - 0 Assessment of Community assistance (transportation, D/C planning, etc.) []  - 0 Additional assistance / Altered mentation []  - 0 Support  Surface(s) Assessment (bed, cushion, seat, etc.) INTERVENTIONS - Wound Cleansing / Measurement X - Simple Wound Cleansing - one wound 1 5 []  - 0 Complex Wound Cleansing - multiple wounds X- 1 5 Wound Imaging (photographs - any number of wounds) []  - 0 Wound Tracing (instead of photographs) X- 1 5 Simple Wound Measurement - one wound []  - 0 Complex Wound Measurement - multiple wounds INTERVENTIONS - Wound Dressings []  - 0 Small Wound Dressing one or multiple wounds X- 1 15 Medium Wound Dressing one or multiple wounds []  - 0 Large Wound Dressing one or multiple wounds []  - 0 Application of Medications - topical []  - 0 Application of Medications - injection INTERVENTIONS - Miscellaneous []  - 0 External ear exam Larry Callahan, Larry Callahan ([id number]) [id number] Page 3 of 10 []  - 0 Specimen Collection (cultures, biopsies, blood, body fluids, etc.) []  - 0 Specimen(s) / Culture(s) sent or taken to Lab for analysis []  - 0 Patient Transfer (multiple staff / Michiel Sites Lift / Similar devices) []  - 0 Simple Staple / Suture removal (25 or less) []  - 0 Complex Staple / Suture removal (26 or more) []  - 0 Hypo / Hyperglycemic Management (close monitor of Blood Glucose) []  - 0 Ankle / Brachial Index (ABI) - do not check if billed separately X- 1 5 Vital Signs Has the patient been seen at the hospital within the last three years: Yes Total Score: 80 Level Of Care: New/Established - Level 3 Electronic Signature(s) Signed: 12/09/2022 11:45:42 AM By: Betha Loa Entered By: Betha Loa on 12/08/2022 08:50:11 -------------------------------------------------------------------------------- Encounter Discharge Information Details Patient Name:  Date of Service: Larry Link MES F. 12/08/2022 8:15 A M Medical Record Number: [id number] Patient Account Number: [id number] Date of Birth/Sex: Treating RN: [birth year] (67 y.o. Larry Callahan Primary Care Dashanti Burr: Sallyanne Kuster Other Clinician: Betha Loa Referring Paije Goodhart: Treating Crystal Scarberry/Extender: Weldon Inches Weeks in Treatment: 16 Encounter Discharge Information Items Discharge Condition: Stable Ambulatory Status: Ambulatory Discharge Destination: Home Transportation: Other Accompanied By: self Schedule Follow-up Appointment: Yes Clinical Summary of Care: Electronic Signature(s) Signed: 12/09/2022 11:45:42 AM By: Betha Loa Entered By: Betha Loa on 12/08/2022 08:56:26 Lower Extremity Assessment Details -------------------------------------------------------------------------------- Larry Callahan ([id number]) [id number] Page 4 of 10 Patient Name: Date of Service: Larry Link MES F. 12/08/2022 8:15 A M Medical Record Number: [id number] Patient Account Number: [id number] Date of Birth/Sex: Treating RN: 19-May-1956 (67 y.o. Larry Callahan Primary Care Oliva Montecalvo: Sallyanne Kuster Other Clinician: Betha Loa Referring Kaityln Kallstrom: Treating Armour Villanueva/Extender: Weldon Inches Weeks in Treatment: 16 Electronic Signature(s) Signed: 12/08/2022 5:12:33 PM By: Angelina Pih Signed: 12/09/2022 11:45:42 AM By: Betha Loa Entered By: Betha Loa on 12/08/2022 08:42:42 -------------------------------------------------------------------------------- Multi Wound Chart Details Patient Name: Date of Service: Larry Link MES F. 12/08/2022 8:15 A M Medical Record Number: [id number] Patient Account Number: [id number] Date of Birth/Sex: Treating RN: 28-May-1956 (67 y.o. Larry Callahan Primary Care Jentry Mcqueary: Sallyanne Kuster Other Clinician: Betha Loa Referring Makyna Niehoff: Treating  Kelon Easom/Extender: Weldon Inches Weeks in Treatment: 16 Vital Signs Height(in): 71 Pulse(bpm): 97 Weight(lbs): 229 Blood Pressure(mmHg): 145/82 Body Mass Index(BMI): 31.9 Temperature(F): 97.6 Respiratory Rate(breaths/min): 16 [2:Photos:] [N/A:N/A] Right Gluteus Left Hand - 2nd Digit N/A Wound Location: Gradually Appeared Blister N/A Wounding Event: Abscess Trauma, Other N/A Primary Etiology: Chronic Obstructive Pulmonary Chronic Obstructive Pulmonary N/A Comorbid History: Disease (COPD), Arrhythmia, Disease (COPD), Arrhythmia, Coronary Artery Disease, Coronary Artery Disease, Hypertension, Type II Diabetes Hypertension, Type II Diabetes 08/29/2022 11/07/2022 N/A Date Acquired: 14 4  N/A Weeks of Treatment: Open Open N/A Wound Status: No No N/A Wound Recurrence: 0.3x0.4x0.2 0.1x0.1x0.1 N/A Measurements L x W x D (cm) 0.094 0.008 N/A A (cm) : rea 0.019 0.001 N/A Volume (cm) : 98.60% 99.50% N/A % Reduction in Area: 99.10% 99.30% N/A % Reduction in Volume: Full Thickness Without Exposed Full Thickness Without Exposed N/A Classification: Support Structures Support Structures Medium Medium N/A Exudate Amount: Serosanguineous Serosanguineous N/A Exudate Type: red, brown red, brown N/A Exudate Color: Large (67-100%) Medium (34-66%) N/A Granulation Amount: Red Pink, Pale N/A Granulation Quality: None Present (0%) N/A N/A Necrotic AmountKLAYTEN, Larry Callahan ([id number]) [id number] Page 5 of 10 Fat Layer (Subcutaneous Tissue): Yes Fat Layer (Subcutaneous Tissue): Yes N/A Exposed Structures: Fascia: No Fascia: No Tendon: No Tendon: No Muscle: No Muscle: No Joint: No Joint: No Bone: No Bone: No None None N/A Epithelialization: Treatment Notes Electronic Signature(s) Signed: 12/09/2022 11:45:42 AM By: Betha Loa Entered By: Betha Loa on 12/08/2022  08:42:46 -------------------------------------------------------------------------------- Multi-Disciplinary Care Plan Details Patient Name: Date of Service: Larry Link MES F. 12/08/2022 8:15 A M Medical Record Number: [id number] Patient Account Number: [id number] Date of Birth/Sex: Treating RN: 12-31-1955 (67 y.o. Larry Callahan Primary Care Tiphany Fayson: Sallyanne Kuster Other Clinician: Betha Loa Referring Mckenzi Buonomo: Treating Wilfredo Canterbury/Extender: Weldon Inches Weeks in Treatment: 16 Active Inactive Necrotic Tissue Nursing Diagnoses: Knowledge deficit related to management of necrotic/devitalized tissue Goals: Necrotic/devitalized tissue will be minimized in the wound bed Date Initiated: 09/01/2022 Target Resolution Date: 11/01/2022 Goal Status: Active Interventions: Assess patient pain level pre-, during and post procedure and prior to discharge Provide education on necrotic tissue and debridement process Notes: Electronic Signature(s) Signed: 12/08/2022 5:12:33 PM By: Angelina Pih Signed: 12/09/2022 11:45:42 AM By: Betha Loa Entered By: Betha Loa on 12/08/2022 08:55:23 Larry Callahan ([id number]) [id number] Page 6 of 10 -------------------------------------------------------------------------------- Pain Assessment Details Patient Name: Date of Service: Larry Link MES F. 12/08/2022 8:15 A M Medical Record Number: [id number] Patient Account Number: [id number] Date of Birth/Sex: Treating RN: 1955-12-21 (67 y.o. Larry Callahan Primary Care Katrinia Straker: Sallyanne Kuster Other Clinician: Betha Loa Referring Mikena Masoner: Treating Crissie Aloi/Extender: Weldon Inches Weeks in Treatment: 16 Active Problems Location of Pain Severity and Description of Pain Patient Has Paino No Site Locations Pain Management and Medication Current Pain Management: Electronic Signature(s) Signed: 12/08/2022 5:12:33 PM  By: Angelina Pih Signed: 12/09/2022 11:45:42 AM By: Betha Loa Entered By: Betha Loa on 12/08/2022 08:36:40 -------------------------------------------------------------------------------- Patient/Caregiver Education Details Patient Name: Date of Service: Larry Link MES F. 6/27/2024andnbsp8:15 A M Medical Record Number: [id number] Patient Account Number: [id number] Date of Birth/Gender: Treating RN: 06-03-1956 (67 y.o. Larry Callahan Primary Care Physician: Sallyanne Kuster Other Clinician: Betha Loa Referring Physician: Treating Physician/Extender: Weldon Inches Weeks in Treatment: 16 Education Assessment Education Provided To: Patient Education Topics Provided Wound/Skin Impairment: Handouts: Other: continue wound care as directed Methods: Explain/Verbal Larry Callahan, Larry Callahan ([id number]) [id number] Page 7 of 10 Responses: State content correctly Electronic Signature(s) Signed: 12/09/2022 11:45:42 AM By: Betha Loa Entered By: Betha Loa on 12/08/2022 08:55:45 -------------------------------------------------------------------------------- Wound Assessment Details Patient Name: Date of Service: Larry Link MES F. 12/08/2022 8:15 A M Medical Record Number: [id number] Patient Account Number: [id number] Date of Birth/Sex: Treating RN: Apr 17, 1956 (67 y.o. Larry Callahan Primary Care Jaasiel Hollyfield: Sallyanne Kuster Other Clinician: Betha Loa Referring Kallan Bischoff: Treating Loki Wuthrich/Extender: Weldon Inches Weeks in Treatment: 16 Wound Status Wound Number: 2 Primary Abscess Etiology: Wound Location: Right Gluteus Wound Open Wounding Event:  Gradually Appeared Status: Date Acquired: 08/29/2022 Comorbid Chronic Obstructive Pulmonary Disease (COPD), Arrhythmia, Weeks Of Treatment: 14 History: Coronary Artery Disease, Hypertension, Type II Diabetes Clustered Wound: No Photos Wound  Measurements Length: (cm) 0.3 Width: (cm) 0.4 Depth: (cm) 0.2 Area: (cm) 0.094 Volume: (cm) 0.019 % Reduction in Area: 98.6% % Reduction in Volume: 99.1% Epithelialization: None Wound Description Classification: Full Thickness Without Exposed Suppor Exudate Amount: Medium Exudate Type: Serosanguineous Exudate Color: red, brown t Structures Foul Odor After Cleansing: No Slough/Fibrino No Wound Bed Granulation Amount: Large (67-100%) Exposed Structure Granulation Quality: Red Fascia Exposed: No Necrotic Amount: None Present (0%) Fat Layer (Subcutaneous Tissue) Exposed: Yes Tendon Exposed: No Muscle Exposed: No Joint Exposed: No Bone Exposed: No Larry Callahan, Larry Callahan ([id number]) [id number] Page 8 of 10 Treatment Notes Wound #2 (Gluteus) Wound Laterality: Right Cleanser Soap and Water Discharge Instruction: Gently cleanse wound with antibacterial soap, rinse and pat dry prior to dressing wounds Peri-Wound Care Topical Primary Dressing Hydrofera Blue Ready Transfer Foam, 2.5x2.5 (in/in) Discharge Instruction: Apply Hydrofera Blue Ready to wound bed as directed Secondary Dressing T Adhesive [employer], 4x4 (in/in) elfa Discharge Instruction: Apply over dressing to secure in place. Secured With Compression Wrap Compression Stockings Facilities manager) Signed: 12/08/2022 5:12:33 PM By: Angelina Pih Signed: 12/09/2022 11:45:42 AM By: Betha Loa Entered By: Betha Loa on 12/08/2022 08:42:13 -------------------------------------------------------------------------------- Wound Assessment Details Patient Name: Date of Service: Larry Link MES F. 12/08/2022 8:15 A M Medical Record Number: [id number] Patient Account Number: [id number] Date of Birth/Sex: Treating RN: 12-Apr-1956 (67 y.o. Larry Callahan Primary Care Charizma Gardiner: Sallyanne Kuster Other Clinician: Betha Loa Referring Demarquez Ciolek: Treating Jann Milkovich/Extender:  Weldon Inches Weeks in Treatment: 16 Wound Status Wound Number: 3 Primary Trauma, Other Etiology: Wound Location: Left Hand - 2nd Digit Wound Healed - Epithelialized Wounding Event: Blister Status: Date Acquired: 11/07/2022 Comorbid Chronic Obstructive Pulmonary Disease (COPD), Arrhythmia, Weeks Of Treatment: 4 History: Coronary Artery Disease, Hypertension, Type II Diabetes Clustered Wound: No Photos Larry Callahan, Larry Callahan ([id number]) [id number] Page 9 of 10 Wound Measurements Length: (cm) Width: (cm) Depth: (cm) Area: (cm) Volume: (cm) 0 % Reduction in Area: 100% 0 % Reduction in Volume: 100% 0 Epithelialization: Large (67-100%) 0 0 Wound Description Classification: Full Thickness Without Exposed Support Exudate Amount: None Present Structures Foul Odor After Cleansing: No Slough/Fibrino No Wound Bed Granulation Amount: None Present (0%) Exposed Structure Fascia Exposed: No Fat Layer (Subcutaneous Tissue) Exposed: Yes Tendon Exposed: No Muscle Exposed: No Joint Exposed: No Bone Exposed: No Treatment Notes Wound #3 (Hand - 2nd Digit) Wound Laterality: Left Cleanser Peri-Wound Care Topical Primary Dressing Secondary Dressing Secured With Compression Wrap Compression Stockings Add-Ons Electronic Signature(s) Signed: 12/08/2022 5:12:33 PM By: Angelina Pih Signed: 12/09/2022 11:45:42 AM By: Betha Loa Entered By: Betha Loa on 12/08/2022 08:49:15 -------------------------------------------------------------------------------- Vitals Details Patient Name: Date of Service: Larry Callahan, Larry MES F. 12/08/2022 8:15 A Hulen Luster ([id number]) [id number] Page 10 of 10 Medical Record Number: [id number] Patient Account Number: [id number] Date of Birth/Sex: Treating RN: 1956/05/18 (67 y.o. Larry Callahan Primary Care Angeliyah Kirkey: Sallyanne Kuster Other Clinician: Betha Loa Referring  Mackinsey Pelland: Treating Vy Badley/Extender: Weldon Inches Weeks in Treatment: 16 Vital Signs Time Taken: 08:34 Temperature (F): 97.6 Height (in): 71 Pulse (bpm): 97 Weight (lbs): 229 Respiratory Rate (breaths/min): 16 Body Mass Index (BMI): 31.9 Blood Pressure (mmHg): 145/82 Reference Range: 80 - 120 mg / dl Electronic Signature(s) Signed: 12/09/2022 11:45:42 AM By: Betha Loa Entered By: Betha Loa on 12/08/2022 08:36:37

## 2022-12-12 DIAGNOSIS — I110 Hypertensive heart disease with heart failure: Secondary | ICD-10-CM | POA: Diagnosis not present

## 2022-12-12 DIAGNOSIS — E119 Type 2 diabetes mellitus without complications: Secondary | ICD-10-CM | POA: Diagnosis not present

## 2022-12-12 DIAGNOSIS — E785 Hyperlipidemia, unspecified: Secondary | ICD-10-CM | POA: Diagnosis not present

## 2022-12-12 DIAGNOSIS — Z602 Problems related to living alone: Secondary | ICD-10-CM | POA: Diagnosis not present

## 2022-12-12 DIAGNOSIS — I509 Heart failure, unspecified: Secondary | ICD-10-CM | POA: Diagnosis not present

## 2022-12-12 DIAGNOSIS — J449 Chronic obstructive pulmonary disease, unspecified: Secondary | ICD-10-CM | POA: Diagnosis not present

## 2022-12-12 DIAGNOSIS — L0231 Cutaneous abscess of buttock: Secondary | ICD-10-CM | POA: Diagnosis not present

## 2022-12-12 DIAGNOSIS — I2510 Atherosclerotic heart disease of native coronary artery without angina pectoris: Secondary | ICD-10-CM | POA: Diagnosis not present

## 2022-12-12 DIAGNOSIS — G4733 Obstructive sleep apnea (adult) (pediatric): Secondary | ICD-10-CM | POA: Diagnosis not present

## 2022-12-14 DIAGNOSIS — I2510 Atherosclerotic heart disease of native coronary artery without angina pectoris: Secondary | ICD-10-CM | POA: Diagnosis not present

## 2022-12-14 DIAGNOSIS — E785 Hyperlipidemia, unspecified: Secondary | ICD-10-CM | POA: Diagnosis not present

## 2022-12-14 DIAGNOSIS — E119 Type 2 diabetes mellitus without complications: Secondary | ICD-10-CM | POA: Diagnosis not present

## 2022-12-14 DIAGNOSIS — I110 Hypertensive heart disease with heart failure: Secondary | ICD-10-CM | POA: Diagnosis not present

## 2022-12-14 DIAGNOSIS — Z602 Problems related to living alone: Secondary | ICD-10-CM | POA: Diagnosis not present

## 2022-12-14 DIAGNOSIS — L0231 Cutaneous abscess of buttock: Secondary | ICD-10-CM | POA: Diagnosis not present

## 2022-12-14 DIAGNOSIS — I509 Heart failure, unspecified: Secondary | ICD-10-CM | POA: Diagnosis not present

## 2022-12-14 DIAGNOSIS — G4733 Obstructive sleep apnea (adult) (pediatric): Secondary | ICD-10-CM | POA: Diagnosis not present

## 2022-12-14 DIAGNOSIS — J449 Chronic obstructive pulmonary disease, unspecified: Secondary | ICD-10-CM | POA: Diagnosis not present

## 2022-12-16 ENCOUNTER — Encounter: Payer: Medicare PPO | Attending: Physician Assistant | Admitting: Physician Assistant

## 2022-12-16 DIAGNOSIS — G4733 Obstructive sleep apnea (adult) (pediatric): Secondary | ICD-10-CM | POA: Diagnosis not present

## 2022-12-16 DIAGNOSIS — I509 Heart failure, unspecified: Secondary | ICD-10-CM | POA: Diagnosis not present

## 2022-12-16 DIAGNOSIS — E785 Hyperlipidemia, unspecified: Secondary | ICD-10-CM | POA: Diagnosis not present

## 2022-12-16 DIAGNOSIS — L98492 Non-pressure chronic ulcer of skin of other sites with fat layer exposed: Secondary | ICD-10-CM | POA: Diagnosis not present

## 2022-12-16 DIAGNOSIS — E119 Type 2 diabetes mellitus without complications: Secondary | ICD-10-CM | POA: Diagnosis not present

## 2022-12-16 DIAGNOSIS — X58XXXA Exposure to other specified factors, initial encounter: Secondary | ICD-10-CM | POA: Insufficient documentation

## 2022-12-16 DIAGNOSIS — L0231 Cutaneous abscess of buttock: Secondary | ICD-10-CM | POA: Diagnosis not present

## 2022-12-16 DIAGNOSIS — I2510 Atherosclerotic heart disease of native coronary artery without angina pectoris: Secondary | ICD-10-CM | POA: Diagnosis not present

## 2022-12-16 DIAGNOSIS — J449 Chronic obstructive pulmonary disease, unspecified: Secondary | ICD-10-CM | POA: Diagnosis not present

## 2022-12-16 DIAGNOSIS — Z602 Problems related to living alone: Secondary | ICD-10-CM | POA: Diagnosis not present

## 2022-12-16 DIAGNOSIS — I110 Hypertensive heart disease with heart failure: Secondary | ICD-10-CM | POA: Diagnosis not present

## 2022-12-16 DIAGNOSIS — S61211A Laceration without foreign body of left index finger without damage to nail, initial encounter: Secondary | ICD-10-CM | POA: Diagnosis not present

## 2022-12-16 NOTE — Progress Notes (Addendum)
GRISSOM, GIBBON ([id number]) [id number] Page 1 of 8 Visit Report for 12/16/2022 Chief Complaint Document Details Patient Name: Date of Service: Larry Callahan Larry Callahan. 12/16/2022 12:00 PM Medical Record Number: [id number] Patient Account Number: [id number] Date of Birth/Sex: Treating RN: [birth year] (67 y.o. Larry Callahan Primary Care Provider: Sallyanne Kuster Other Clinician: Betha Loa Referring Provider: Treating Provider/Extender: Weldon Inches Weeks in Treatment: 17 Information Obtained from: Patient Chief Complaint Right gluteal abscess and Left 2nd finger ulcer Electronic Signature(s) Signed: 12/16/2022 11:50:54 AM By: Allen Derry PA-C Entered By: Allen Derry on 12/16/2022 11:50:54 -------------------------------------------------------------------------------- HPI Details Patient Name: Date of Service: Larry Callahan, Larry Callahan Larry Callahan. 12/16/2022 12:00 PM Medical Record Number: [id number] Patient Account Number: [id number] Date of Birth/Sex: Treating RN: [birth year] (67 y.o. Larry Callahan Primary Care Provider: Sallyanne Kuster Other Clinician: Betha Loa Referring Provider: Treating Provider/Extender: Weldon Inches Weeks in Treatment: 17 History of Present Illness HPI Description: ADMISSION 08/15/2022 This is a 67 year old man who came in with very little medical information. There was some suggestion that he was in the hospital last month with cellulitis of the arm I will need to check this out thoroughly. He tells me that he received 2 courses of antibiotics last month were not really sure which ones they were but they are completed now. He is not really having any discomfort. His sister who lives nearby is dressing this with what sounds like Coban. I looked through [facility] health Callahan. He was in the hospital about 3 weeks ago for an infection of the right elbow. Prior to that he had been in an urgent care receiving  Keflex. I do not think he was admitted to hospital but may have spent some time in the ER. I do not see any cultures. 3/11; patient presents for follow-up. He has been using Hydrofera Blue to the right elbow. The wound has healed. Readmission: 09-01-2022 upon evaluation today patient presents for reevaluation here in the clinic he actually was previously seen for his elbow today he comes in with the elbow okay but unfortunately a right gluteal abscess. Unfortunately he tells me that this is somewhat painful. He was seen in the ER they gave him doxycycline he has not picked that up yet that was just yesterday. [name], [name] ([id number]) [id number] Page 2 of 8 09-08-2022 upon evaluation today patient's wound actually appears to be excellent. In fact he called in stating that was bleeding profusely have a note in communications concerning that. Nonetheless the home health nurse and reassured me he was doing okay and this appears to be doing just fine today as well. I think it is actually showing less pressure compared to last time I saw him as far as the overall surrounding tissue and how things appear. 09-15-2022 upon evaluation today patient's wound unfortunately appears to be infected. I do believe that he is going to require intervention as far as antibiotics are concerned I am actually going to grab a wound culture today as well. 09-22-2022 upon evaluation today patient appears to be doing better in regard to his wound and actually very pleased with where things stand. Fortunately I do think that he is moving in the right direction. No fevers, chills, nausea, vomiting, or diarrhea. 09-30-2022 upon evaluation today patient appears to be doing well currently in regard to his wound. This is actually showing signs of improvement which is great news. I am very pleased with where we stand. I do not see any evidence of  active infection locally nor systemically which is great news.  The patient in general tells me that he is still having some discomfort but this is not nearly as much as it was in the beginning. Overall I do believe that we are moving in the right direction. 10-07-2022 upon evaluation today patient appears to be doing well currently in regard to his wound which is showing signs of improvement. Fortunately I do not see any evidence of active infection locally nor systemically which is great news. No fevers, chills, nausea, vomiting, or diarrhea. 10-17-2022 upon evaluation today patient appears to be doing well currently in regard to his wound though he continues to have fecal material in and around the dressing and wound. This is not good for the healing and may in fact be slowing things down quite a bit. 10-25-2022 upon evaluation today patient's wound actually appears to be making progress which is good news. Fortunately I do not see any signs of infection and the wound is measuring smaller. I do not feel there is any evidence of active infection locally nor systemically at this point. 11-01-2022 upon evaluation today patient appears to be doing well currently in regard to his wound which is actually showing signs of improvement. Fortunately there does not appear to be any evidence of active infection locally nor systemically which is great news. No fevers, chills, nausea, vomiting, or diarrhea. 11-10-2022 upon evaluation today patient appears to be doing well currently in regard to his wound in the gluteal region which is making progress here. Fortunately I do not see any signs of infection he is measuring smaller and again I felt removing in the right way here. In regard to his finger this is a new injury that he cannot even tell me how he injured this. This is of concern for sure but again I am not exactly sure the etiology behind what happened he feels he may have "hit it on something" nonetheless I really do not know although it does not look bruised. We have  gotten a note from the home health nurse stating that he had shut it in a door. Nonetheless the patient actually denies this. I really do not know what happened here. 11-17-2022 upon evaluation today patient appears to be doing well currently in regard to his wounds. Fortunately there does not appear to be any signs of active infection at this time. The finger is looking better and the wound in the gluteal region is also doing better. In general I think that we will make an some good progress here. 11-24-2022 upon evaluation today patient appears to be doing well with regard to the wound in the gluteal region. This is on the right. With that being said he does require some debridement regards to the finger there is a little bit of necrotic tissue with an x-ray of that away. 12-01-2022 upon evaluation patient appears to be doing well currently in regard to his wounds. He is actually showing signs of excellent improvement which is great news and in general I do think that we are moving in the right direction here. I do not see any signs of active infection locally nor systemically which is great news. Unfortunately the T egaderm did not stay in place he is going back to the bordered gauze dressing. 12-08-2022 upon evaluation today patient actually appears to be doing excellent in fact he is showing signs of great improvement and very pleased with where we stand I do believe that we are  moving in the right direction here. No fevers, chills, nausea, vomiting, or diarrhea. 12-16-2022 upon evaluation today patient appears to be doing well currently in regard to his wound. He has been tolerating the dressing changes without complication and the good news is he seems to be doing excellent at this point. I do not see any evidence of active infection locally nor systemically which is great news. Unfortunately in regard to his finger which has healed he actually does have evidence of an infection here this is swollen  and erythematous it is not open there are no areas of drainage but at the same time I do think he is going require some treatment here. Electronic Signature(s) Signed: 12/16/2022 12:26:31 PM By: Allen Derry PA-C Entered By: Allen Derry on 12/16/2022 12:26:31 -------------------------------------------------------------------------------- Physical Exam Details Patient Name: Date of Service: Larry Callahan Larry Callahan. 12/16/2022 12:00 PM Medical Record Number: [id number] Patient Account Number: [id number] Date of Birth/Sex: Treating RN: July 19, 1955 (67 y.o. Larry Callahan Primary Care Provider: Sallyanne Kuster Other Clinician: Betha Loa Referring Provider: Treating Provider/Extender: Weldon Inches Weeks in Treatment: 17 Constitutional Well-nourished and well-hydrated in no acute distress. Respiratory DUFF, FRANCHINI ([id number]) [id number] Page 3 of 8 normal breathing without difficulty. Psychiatric this patient is able to make decisions and demonstrates good insight into disease process. Alert and Oriented x 3. pleasant and cooperative. Notes Upon inspection patient's wound on the right gluteal region is pretty much about healed this looks really good. I am very pleased in that regard. In regard to the finger which has healed already this does look erythematous and swollen I do believe that he hit it on something which is probably what resulted in this nonetheless we do need to try to get this under control and prevent it from worsening. Electronic Signature(s) Signed: 12/16/2022 12:26:56 PM By: Allen Derry PA-C Entered By: Allen Derry on 12/16/2022 12:26:56 -------------------------------------------------------------------------------- Physician Orders Details Patient Name: Date of Service: Larry Callahan Larry Callahan. 12/16/2022 12:00 PM Medical Record Number: [id number] Patient Account Number: [id number] Date of Birth/Sex: Treating RN: 20-Jan-1956  (67 y.o. Larry Callahan Primary Care Provider: Sallyanne Kuster Other Clinician: Betha Loa Referring Provider: Treating Provider/Extender: Weldon Inches Weeks in Treatment: 17 Verbal / Phone Orders: Yes Clinician: Angelina Pih Read Back and Verified: Yes Diagnosis Coding ICD-10 Coding Code Description L02.31 Cutaneous abscess of buttock L98.492 Non-pressure chronic ulcer of skin of other sites with fat layer exposed S61.211A Laceration without foreign body of left index finger without damage to nail, initial encounter Follow-up Appointments Return Appointment in 1 week. Home Health [facility] for wound care. May utilize formulary equivalent dressing for wound treatment orders unless otherwise specified. Home Health Nurse may visit PRN to address patients wound care needs. Rolene Arbour Fax: [phone] Anesthetic (Use 'Patient Medications' Section for Anesthetic Order Entry) Lidocaine applied to wound bed Edema Control - Lymphedema / Segmental Compressive Device / Other Elevate, Exercise Daily and A void Standing for Long Periods of Time. Elevate legs to the level of the heart and pump ankles as often as possible Elevate leg(s) parallel to the floor when sitting. Wound Treatment Wound #2 - Gluteus Wound Laterality: Right Cleanser: Soap and Water 3 x Per Week/15 Days Discharge Instructions: Gently cleanse wound with antibacterial soap, rinse and pat dry prior to dressing wounds Prim Dressing: Hydrofera Blue Ready Transfer Foam, 2.5x2.5 (in/in) (Generic) 3 x Per Week/15 Days ary Discharge Instructions: Apply Hydrofera Blue Ready to wound bed as directed Secondary  Dressing: T Adhesive [employer], 4x4 (in/in) 3 x Per Week/15 Days elfa Discharge Instructions: Apply over dressing to secure in place. Patient Medications NIALL, TIERRABLANCA ([id number]) [id number] Page 4 of 8 llergies: penicillin A Notifications  Medication Indication Start End 12/16/2022 Bactrim DS DOSE 1 - oral 800 mg-160 mg tablet - 1 tablet oral twice a day x 14 days. DO NOT take your potassium while on this medication Electronic Signature(s) Signed: 12/16/2022 12:28:28 PM By: Allen Derry PA-C Entered By: Allen Derry on 12/16/2022 40:98:11 Prescription 12/16/2022 -------------------------------------------------------------------------------- Barnie Mort PA-C Patient Name: Provider: May 22, 1956 [id number] Date of Birth: NPI#: [id number] Sex: DEA #: [phone] Phone #: License #: UPN: Patient Address: [address] [facility] [place], [place] [address] [facility] [address], [address] [place], [place] [address] [phone] Allergies penicillin Provider's Orders CONTINUE Home Health for wound care. May utilize formulary equivalent dressing for wound treatment orders unless otherwise specified. Home Health Nurse may visit PRN to address patients wound care needs. Rolene Arbour Fax: [phone] Hand Signature: Date(s): Electronic Signature(s) Signed: 12/16/2022 12:56:26 PM By: Allen Derry PA-C Entered By: Allen Derry on 12/16/2022 12:28:28 -------------------------------------------------------------------------------- Problem List Details Patient Name: Date of Service: Larry Callahan Larry Callahan. 12/16/2022 12:00 PM Medical Record Number: [id number] Patient Account Number: [id number] Date of Birth/Sex: Treating RN: 06-14-1955 (67 y.o. Larry Callahan Hubbard, Penrose Callahan ([id number]) [id number] Page 5 of 8 Primary Care Provider: Sallyanne Kuster Other Clinician: Betha Loa Referring Provider: Treating Provider/Extender: Weldon Inches Weeks in Treatment: 17 Active Problems ICD-10 Encounter Code Description Active Date MDM Diagnosis L02.31 Cutaneous abscess of buttock 08/15/2022 No  Yes L98.492 Non-pressure chronic ulcer of skin of other sites with fat layer exposed 11/10/2022 No Yes S61.211A Laceration without foreign body of left index finger without damage to nail, 11/10/2022 No Yes initial encounter Inactive Problems Resolved Problems Electronic Signature(s) Signed: 12/16/2022 11:49:40 AM By: Allen Derry PA-C Entered By: Allen Derry on 12/16/2022 11:49:40 -------------------------------------------------------------------------------- Progress Note Details Patient Name: Date of Service: Larry Callahan Larry Callahan. 12/16/2022 12:00 PM Medical Record Number: [id number] Patient Account Number: [id number] Date of Birth/Sex: Treating RN: 03/24/1956 (67 y.o. Larry Callahan Primary Care Provider: Sallyanne Kuster Other Clinician: Betha Loa Referring Provider: Treating Provider/Extender: Weldon Inches Weeks in Treatment: 17 Subjective Chief Complaint Information obtained from Patient Right gluteal abscess and Left 2nd finger ulcer History of Present Illness (HPI) ADMISSION 08/15/2022 This is a 67 year old man who came in with very little medical information. There was some suggestion that he was in the hospital last month with cellulitis of the arm I will need to check this out thoroughly. He tells me that he received 2 courses of antibiotics last month were not really sure which ones they were but they are completed now. He is not really having any discomfort. His sister who lives nearby is dressing this with what sounds like Coban. I looked through [facility] Callahan. He was in the hospital about 3 weeks ago for an infection of the right elbow. Prior to that he had been in an urgent care receiving Keflex. I do not think he was admitted to hospital but may have spent some time in the ER. I do not see any cultures. 3/11; patient presents for follow-up. He has been using Hydrofera Blue to the right elbow. The wound has healed. Readmission: 09-01-2022 upon  evaluation today patient presents for reevaluation here in the  clinic he actually was previously seen for his elbow today he comes in with the elbow okay but unfortunately a right gluteal abscess. Unfortunately he tells me that this is somewhat painful. He was seen in the ER they gave him BEUFORD, ROMANOWSKI ([id number]) [id number] Page 6 of 8 doxycycline he has not picked that up yet that was just yesterday. 09-08-2022 upon evaluation today patient's wound actually appears to be excellent. In fact he called in stating that was bleeding profusely have a note in communications concerning that. Nonetheless the home health nurse and reassured me he was doing okay and this appears to be doing just fine today as well. I think it is actually showing less pressure compared to last time I saw him as far as the overall surrounding tissue and how things appear. 09-15-2022 upon evaluation today patient's wound unfortunately appears to be infected. I do believe that he is going to require intervention as far as antibiotics are concerned I am actually going to grab a wound culture today as well. 09-22-2022 upon evaluation today patient appears to be doing better in regard to his wound and actually very pleased with where things stand. Fortunately I do think that he is moving in the right direction. No fevers, chills, nausea, vomiting, or diarrhea. 09-30-2022 upon evaluation today patient appears to be doing well currently in regard to his wound. This is actually showing signs of improvement which is great news. I am very pleased with where we stand. I do not see any evidence of active infection locally nor systemically which is great news. The patient in general tells me that he is still having some discomfort but this is not nearly as much as it was in the beginning. Overall I do believe that we are moving in the right direction. 10-07-2022 upon evaluation today patient appears to be doing well  currently in regard to his wound which is showing signs of improvement. Fortunately I do not see any evidence of active infection locally nor systemically which is great news. No fevers, chills, nausea, vomiting, or diarrhea. 10-17-2022 upon evaluation today patient appears to be doing well currently in regard to his wound though he continues to have fecal material in and around the dressing and wound. This is not good for the healing and may in fact be slowing things down quite a bit. 10-25-2022 upon evaluation today patient's wound actually appears to be making progress which is good news. Fortunately I do not see any signs of infection and the wound is measuring smaller. I do not feel there is any evidence of active infection locally nor systemically at this point. 11-01-2022 upon evaluation today patient appears to be doing well currently in regard to his wound which is actually showing signs of improvement. Fortunately there does not appear to be any evidence of active infection locally nor systemically which is great news. No fevers, chills, nausea, vomiting, or diarrhea. 11-10-2022 upon evaluation today patient appears to be doing well currently in regard to his wound in the gluteal region which is making progress here. Fortunately I do not see any signs of infection he is measuring smaller and again I felt removing in the right way here. In regard to his finger this is a new injury that he cannot even tell me how he injured this. This is of concern for sure but again I am not exactly sure the etiology behind what happened he feels he may have "hit it on something" nonetheless I really do  not know although it does not look bruised. We have gotten a note from the home health nurse stating that he had shut it in a door. Nonetheless the patient actually denies this. I really do not know what happened here. 11-17-2022 upon evaluation today patient appears to be doing well currently in regard to his wounds.  Fortunately there does not appear to be any signs of active infection at this time. The finger is looking better and the wound in the gluteal region is also doing better. In general I think that we will make an some good progress here. 11-24-2022 upon evaluation today patient appears to be doing well with regard to the wound in the gluteal region. This is on the right. With that being said he does require some debridement regards to the finger there is a little bit of necrotic tissue with an x-ray of that away. 12-01-2022 upon evaluation patient appears to be doing well currently in regard to his wounds. He is actually showing signs of excellent improvement which is great news and in general I do think that we are moving in the right direction here. I do not see any signs of active infection locally nor systemically which is great news. Unfortunately the T egaderm did not stay in place he is going back to the bordered gauze dressing. 12-08-2022 upon evaluation today patient actually appears to be doing excellent in fact he is showing signs of great improvement and very pleased with where we stand I do believe that we are moving in the right direction here. No fevers, chills, nausea, vomiting, or diarrhea. 12-16-2022 upon evaluation today patient appears to be doing well currently in regard to his wound. He has been tolerating the dressing changes without complication and the good news is he seems to be doing excellent at this point. I do not see any evidence of active infection locally nor systemically which is great news. Unfortunately in regard to his finger which has healed he actually does have evidence of an infection here this is swollen and erythematous it is not open there are no areas of drainage but at the same time I do think he is going require some treatment here. Objective Constitutional Well-nourished and well-hydrated in no acute distress. Vitals Time Taken: 11:53 AM, Height: 71 in,  Weight: 229 lbs, BMI: 31.9, Temperature: 98.2 Callahan, Pulse: 91 bpm, Respiratory Rate: 18 breaths/min, Blood Pressure: 142/88 mmHg. Respiratory normal breathing without difficulty. Psychiatric this patient is able to make decisions and demonstrates good insight into disease process. Alert and Oriented x 3. pleasant and cooperative. General Notes: Upon inspection patient's wound on the right gluteal region is pretty much about healed this looks really good. I am very pleased in that regard. In regard to the finger which has healed already this does look erythematous and swollen I do believe that he hit it on something which is probably what resulted in this nonetheless we do need to try to get this under control and prevent it from worsening. Integumentary (Hair, Skin) Wound #2 status is Open. Original cause of wound was Gradually Appeared. The date acquired was: 08/29/2022. The wound has been in treatment 15 weeks. The wound is located on the Right Gluteus. The wound measures 0.1cm length x 0.3cm width x 0.1cm depth; 0.024cm^2 area and 0.002cm^3 volume. There is Fat Layer (Subcutaneous Tissue) exposed. There is a medium amount of serosanguineous drainage noted. There is large (67-100%) red granulation within the wound bed. There is no necrotic tissue  within the wound bed. Larry Callahan, Larry Callahan ([id number]) [id number] Page 7 of 8 Assessment Active Problems ICD-10 Cutaneous abscess of buttock Non-pressure chronic ulcer of skin of other sites with fat layer exposed Laceration without foreign body of left index finger without damage to nail, initial encounter Plan Follow-up Appointments: Return Appointment in 1 week. Home Health: [facility] for wound care. May utilize formulary equivalent dressing for wound treatment orders unless otherwise specified. Home Health Nurse may visit PRN to address patients wound care needs. Rolene Arbour Fax: [phone] Anesthetic (Use  'Patient Medications' Section for Anesthetic Order Entry): Lidocaine applied to wound bed Edema Control - Lymphedema / Segmental Compressive Device / Other: Elevate, Exercise Daily and Avoid Standing for Long Periods of Time. Elevate legs to the level of the heart and pump ankles as often as possible Elevate leg(s) parallel to the floor when sitting. The following medication(s) was prescribed: Bactrim DS oral 800 mg-160 mg tablet 1 1 tablet oral twice a day x 14 days. DO NOT take your potassium while on this medication starting 12/16/2022 WOUND #2: - Gluteus Wound Laterality: Right Cleanser: Soap and Water 3 x Per Week/15 Days Discharge Instructions: Gently cleanse wound with antibacterial soap, rinse and pat dry prior to dressing wounds Prim Dressing: Hydrofera Blue Ready Transfer Foam, 2.5x2.5 (in/in) (Generic) 3 x Per Week/15 Days ary Discharge Instructions: Apply Hydrofera Blue Ready to wound bed as directed Secondary Dressing: T Adhesive [employer], 4x4 (in/in) 3 x Per Week/15 Days elfa Discharge Instructions: Apply over dressing to secure in place. 1. I would recommend that we have the patient continue to monitor for any evidence of infection or worsening. Based on what I am seeing currently I do believe that he is really doing quite well. I am pleased with the progress up to this point. 2. I would recommend as well that the patient should continue to utilize the [facility] which I think is doing well in the gluteal region he will use the [employer] dressing to cover. 3. With regard to his finger I am going to go ahead and send in a prescription for antibiotic he is in agreement with that plan specifically I recommended with his allergies that we look towards Bactrim which I think is a good option here he is in agreement with that plan. I did advise that he should not take the potassium while on the antibiotic. We will see patient back for reevaluation in 1 week here in the  clinic. If anything worsens or changes patient will contact our office for additional recommendations. Electronic Signature(s) Signed: 12/16/2022 12:28:54 PM By: Allen Derry PA-C Entered By: Allen Derry on 12/16/2022 12:28:54 -------------------------------------------------------------------------------- SuperBill Details Patient Name: Date of Service: Larry Callahan, Larry Callahan Larry Callahan. 12/16/2022 Medical Record Number: [id number] Patient Account Number: [id number] Date of Birth/Sex: Treating RN: [birth year] (67 y.o. Larry Callahan Primary Care Provider: Sallyanne Kuster Other Clinician: Betha Loa Referring Provider: Treating Provider/Extender: Weldon Inches Weeks in Treatment: [address] Larry Callahan, Larry Callahan ([id number]) [id number] Page 8 of 8 ICD-10 Codes Code Description L02.31 Cutaneous abscess of buttock L98.492 Non-pressure chronic ulcer of skin of other sites with fat layer exposed S61.211A Laceration without foreign body of left index finger without damage to nail, initial encounter Facility Procedures : CPT4 Code: [id number] Description: 99213 - WOUND CARE VISIT-LEV 3 EST PT Modifier: Quantity: 1 Physician Procedures : CPT4 Code Description Modifier [id number] 99214 - WC PHYS LEVEL 4 - EST PT ICD-10 Diagnosis Description L02.31 Cutaneous abscess  of buttock L98.492 Non-pressure chronic ulcer of skin of other sites with fat layer exposed S61.211A Laceration without  foreign body of left index finger without damage to nail, initial encounter Quantity: 1 Electronic Signature(s) Signed: 12/16/2022 12:29:10 PM By: Allen Derry PA-C Entered By: Allen Derry on 12/16/2022 12:29:10

## 2022-12-16 NOTE — Progress Notes (Addendum)
Larry, Callahan ([id number]) [id number] Page 1 of 8 Visit Report for 12/16/2022 Arrival Information Details Patient Name: Date of Service: Larry Callahan MES F. 12/16/2022 12:00 PM Medical Record Number: [id number] Patient Account Number: [id number] Date of Birth/Sex: Treating RN: 1956/04/04 (67 y.o. Larry Callahan Primary Care Rajveer Handler: Sallyanne Kuster Other Clinician: Betha Loa Referring Marquest Gunkel: Treating Kameisha Malicki/Extender: Weldon Inches Weeks in Treatment: 17 Visit Information History Since Last Visit All ordered tests and consults were completed: No Patient Arrived: Ambulatory Added or deleted any medications: No Arrival Time: 11:46 Any new allergies or adverse reactions: No Transfer Assistance: None Had a fall or experienced change in No Patient Identification Verified: Yes activities of daily living that may affect Secondary Verification Process Completed: Yes risk of falls: Patient Requires Transmission-Based Precautions: No Signs or symptoms of abuse/neglect since last visito No Patient Has Alerts: Yes Hospitalized since last visit: No Patient Alerts: Patient on Blood Thinner Implantable device outside of the clinic excluding No cellular tissue based products placed in the center since last visit: Has Dressing in Place as Prescribed: Yes Pain Present Now: No Electronic Signature(s) Signed: 12/16/2022 12:39:59 PM By: Betha Loa Entered By: Betha Loa on 12/16/2022 11:52:58 -------------------------------------------------------------------------------- Clinic Level of Care Assessment Details Patient Name: Date of Service: Larry Callahan MES F. 12/16/2022 12:00 PM Medical Record Number: [id number] Patient Account Number: [id number] Date of Birth/Sex: Treating RN: [birth year] (67 y.o. Larry Callahan Primary Care Larry Callahan: Sallyanne Kuster Other Clinician: Betha Loa Referring Larry Callahan: Treating  Larry Callahan/Extender: Weldon Inches Weeks in Treatment: 17 Clinic Level of Care Assessment Items TOOL 4 Quantity Score []  - 0 Use when only an EandM is performed on FOLLOW-UP visit ASSESSMENTS - Nursing Assessment / Reassessment X- 1 10 Reassessment of Co-morbidities (includes updates in patient status) X- 1 5 Reassessment of Adherence to Treatment Plan KAIREE, AMICI ([id number]) [id number] Page 2 of 8 ASSESSMENTS - Wound and Skin A ssessment / Reassessment X - Simple Wound Assessment / Reassessment - one wound 1 5 []  - 0 Complex Wound Assessment / Reassessment - multiple wounds []  - 0 Dermatologic / Skin Assessment (not related to wound area) ASSESSMENTS - Focused Assessment []  - 0 Circumferential Edema Measurements - multi extremities []  - 0 Nutritional Assessment / Counseling / Intervention []  - 0 Lower Extremity Assessment (monofilament, tuning fork, pulses) []  - 0 Peripheral Arterial Disease Assessment (using hand held doppler) ASSESSMENTS - Ostomy and/or Continence Assessment and Care []  - 0 Incontinence Assessment and Management []  - 0 Ostomy Care Assessment and Management (repouching, etc.) PROCESS - Coordination of Care X - Simple Patient / Family Education for ongoing care 1 15 []  - 0 Complex (extensive) Patient / Family Education for ongoing care []  - 0 Staff obtains Chiropractor, Records, T Results / Process Orders est []  - 0 Staff telephones HHA, Nursing Homes / Clarify orders / etc []  - 0 Routine Transfer to another Facility (non-emergent condition) []  - 0 Routine Hospital Admission (non-emergent condition) []  - 0 New Admissions / Manufacturing engineer / Ordering NPWT Apligraf, etc. , []  - 0 Emergency Hospital Admission (emergent condition) X- 1 10 Simple Discharge Coordination []  - 0 Complex (extensive) Discharge Coordination PROCESS - Special Needs []  - 0 Pediatric / Minor Patient Management []  -  0 Isolation Patient Management []  - 0 Hearing / Language / Visual special needs []  - 0 Assessment of Community assistance (transportation, D/C planning, etc.) []  - 0 Additional assistance / Altered mentation []  - 0 Support Surface(s) Assessment (  bed, cushion, seat, etc.) INTERVENTIONS - Wound Cleansing / Measurement X - Simple Wound Cleansing - one wound 1 5 []  - 0 Complex Wound Cleansing - multiple wounds X- 1 5 Wound Imaging (photographs - any number of wounds) []  - 0 Wound Tracing (instead of photographs) X- 1 5 Simple Wound Measurement - one wound []  - 0 Complex Wound Measurement - multiple wounds INTERVENTIONS - Wound Dressings []  - 0 Small Wound Dressing one or multiple wounds X- 1 15 Medium Wound Dressing one or multiple wounds []  - 0 Large Wound Dressing one or multiple wounds []  - 0 Application of Medications - topical []  - 0 Application of Medications - injection INTERVENTIONS - Miscellaneous []  - 0 External ear exam CERONE, HISLE ([id number]) [id number] Page 3 of 8 []  - 0 Specimen Collection (cultures, biopsies, blood, body fluids, etc.) []  - 0 Specimen(s) / Culture(s) sent or taken to Lab for analysis []  - 0 Patient Transfer (multiple staff / Michiel Sites Lift / Similar devices) []  - 0 Simple Staple / Suture removal (25 or less) []  - 0 Complex Staple / Suture removal (26 or more) []  - 0 Hypo / Hyperglycemic Management (close monitor of Blood Glucose) []  - 0 Ankle / Brachial Index (ABI) - do not check if billed separately X- 1 5 Vital Signs Has the patient been seen at the hospital within the last three years: Yes Total Score: 80 Level Of Care: New/Established - Level 3 Electronic Signature(s) Signed: 12/16/2022 12:39:59 PM By: Betha Loa Entered By: Betha Loa on 12/16/2022 12:16:28 -------------------------------------------------------------------------------- Encounter Discharge Information Details Patient Name:  Date of Service: Larry Callahan MES F. 12/16/2022 12:00 PM Medical Record Number: [id number] Patient Account Number: [id number] Date of Birth/Sex: Treating RN: 12/17/1955 (67 y.o. Larry Callahan Primary Care Jacky Dross: Sallyanne Kuster Other Clinician: Betha Loa Referring Karesa Maultsby: Treating Brindley Madarang/Extender: Weldon Inches Weeks in Treatment: 17 Encounter Discharge Information Items Discharge Condition: Stable Ambulatory Status: Ambulatory Discharge Destination: Home Transportation: Private Auto Accompanied By: self Schedule Follow-up Appointment: Yes Clinical Summary of Care: Electronic Signature(s) Signed: 12/16/2022 12:39:59 PM By: Betha Loa Entered By: Betha Loa on 12/16/2022 12:17:40 Lower Extremity Assessment Details -------------------------------------------------------------------------------- [place] ([id number]) [id number] Page 4 of 8 Patient Name: Date of Service: Larry Callahan MES F. 12/16/2022 12:00 PM Medical Record Number: [id number] Patient Account Number: [id number] Date of Birth/Sex: Treating RN: 1955/10/18 (67 y.o. Larry Callahan Primary Care Perel Hauschild: Sallyanne Kuster Other Clinician: Betha Loa Referring Kalla Watson: Treating Brian Zeitlin/Extender: Weldon Inches Weeks in Treatment: 17 Electronic Signature(s) Signed: 12/16/2022 12:39:59 PM By: Betha Loa Signed: 12/16/2022 1:30:41 PM By: Angelina Pih Entered By: Betha Loa on 12/16/2022 12:00:34 -------------------------------------------------------------------------------- Multi Wound Chart Details Patient Name: Date of Service: Larry Callahan MES F. 12/16/2022 12:00 PM Medical Record Number: [id number] Patient Account Number: [id number] Date of Birth/Sex: Treating RN: 29-Oct-1955 (67 y.o. Larry Callahan Primary Care Angelos Wasco: Sallyanne Kuster Other Clinician: Betha Loa Referring Baldwin Racicot: Treating  Patsy Zaragoza/Extender: Weldon Inches Weeks in Treatment: 17 Vital Signs Height(in): 71 Pulse(bpm): 91 Weight(lbs): 229 Blood Pressure(mmHg): 142/88 Body Mass Index(BMI): 31.9 Temperature(F): 98.2 Respiratory Rate(breaths/min): 18 [2:Photos:] [N/A:N/A] Right Gluteus N/A N/A Wound Location: Gradually Appeared N/A N/A Wounding Event: Abscess N/A N/A Primary Etiology: Chronic Obstructive Pulmonary N/A N/A Comorbid History: Disease (COPD), Arrhythmia, Coronary Artery Disease, Hypertension, Type II Diabetes 08/29/2022 N/A N/A Date Acquired: 15 N/A N/A Weeks of Treatment: Open N/A N/A Wound Status: No N/A N/A Wound Recurrence: 0.1x0.3x0.1 N/A N/A Measurements L x W  x D (cm) 0.024 N/A N/A A (cm) : rea 0.002 N/A N/A Volume (cm) : 99.70% N/A N/A % Reduction in Area: 99.90% N/A N/A % Reduction in Volume: Full Thickness Without Exposed N/A N/A Classification: Support Structures Medium N/A N/A Exudate Amount: Serosanguineous N/A N/A Exudate Type: red, brown N/A N/A Exudate Color: Large (67-100%) N/A N/A Granulation Amount: Red N/A N/A Granulation Quality: None Present (0%) N/A N/A Necrotic AmountTRAESON, CECI ([id number]) [id number] Page 5 of 8 Fat Layer (Subcutaneous Tissue): Yes N/A N/A Exposed Structures: Fascia: No Tendon: No Muscle: No Joint: No Bone: No None N/A N/A Epithelialization: Treatment Notes Electronic Signature(s) Signed: 12/16/2022 12:39:59 PM By: Betha Loa Entered By: Betha Loa on 12/16/2022 12:00:38 -------------------------------------------------------------------------------- Multi-Disciplinary Care Plan Details Patient Name: Date of Service: Larry Callahan MES F. 12/16/2022 12:00 PM Medical Record Number: [id number] Patient Account Number: [id number] Date of Birth/Sex: Treating RN: 1955/12/17 (67 y.o. Larry Callahan Primary Care Landis Cassaro: Sallyanne Kuster Other Clinician: Betha Loa Referring Harriette Tovey: Treating Chae Oommen/Extender: Weldon Inches Weeks in Treatment: 17 Active Inactive Necrotic Tissue Nursing Diagnoses: Knowledge deficit related to management of necrotic/devitalized tissue Goals: Necrotic/devitalized tissue will be minimized in the wound bed Date Initiated: 09/01/2022 Target Resolution Date: 11/01/2022 Goal Status: Active Interventions: Assess patient pain level pre-, during and post procedure and prior to discharge Provide education on necrotic tissue and debridement process Notes: Electronic Signature(s) Signed: 12/16/2022 12:39:59 PM By: Betha Loa Signed: 12/16/2022 1:30:41 PM By: Angelina Pih Entered By: Betha Loa on 12/16/2022 12:16:44 [place] ([id number]) [id number] Page 6 of 8 -------------------------------------------------------------------------------- Pain Assessment Details Patient Name: Date of Service: Larry Callahan MES F. 12/16/2022 12:00 PM Medical Record Number: [id number] Patient Account Number: [id number] Date of Birth/Sex: Treating RN: [birth year] (67 y.o. Larry Callahan Primary Care Shenouda Genova: Sallyanne Kuster Other Clinician: Betha Loa Referring Avaline Stillson: Treating Stehanie Ekstrom/Extender: Weldon Inches Weeks in Treatment: 17 Active Problems Location of Pain Severity and Description of Pain Patient Has Paino No Site Locations Pain Management and Medication Current Pain Management: Electronic Signature(s) Signed: 12/16/2022 12:39:59 PM By: Betha Loa Signed: 12/16/2022 1:30:41 PM By: Angelina Pih Entered By: Betha Loa on 12/16/2022 11:55:21 -------------------------------------------------------------------------------- Patient/Caregiver Education Details Patient Name: Date of Service: Larry Callahan MES F. 7/5/2024andnbsp12:00 PM Medical Record Number: [id number] Patient Account Number: [id number] Date of Birth/Gender: Treating  RN: 1956-01-05 (67 y.o. Larry Callahan Primary Care Physician: Sallyanne Kuster Other Clinician: Betha Loa Referring Physician: Treating Physician/Extender: Weldon Inches Weeks in Treatment: 17 Education Assessment Education Provided To: Patient Education Topics Provided Infection: Handouts: Other: Start antibiotic for finger Methods: Explain/Verbal MACIEL, WYLLIE ([id number]) [id number] Page 7 of 8 Responses: State content correctly Wound/Skin Impairment: Handouts: Other: continue wound care as directed Electronic Signature(s) Signed: 12/16/2022 12:39:59 PM By: Betha Loa Entered By: Betha Loa on 12/16/2022 12:17:14 -------------------------------------------------------------------------------- Wound Assessment Details Patient Name: Date of Service: Larry Callahan MES F. 12/16/2022 12:00 PM Medical Record Number: [id number] Patient Account Number: [id number] Date of Birth/Sex: Treating RN: 16-Jun-1955 (67 y.o. Larry Callahan Primary Care Tyrian Peart: Sallyanne Kuster Other Clinician: Betha Loa Referring Laketra Bowdish: Treating Estephan Gallardo/Extender: Weldon Inches Weeks in Treatment: 17 Wound Status Wound Number: 2 Primary Abscess Etiology: Wound Location: Right Gluteus Wound Open Wounding Event: Gradually Appeared Status: Date Acquired: 08/29/2022 Comorbid Chronic Obstructive Pulmonary Disease (COPD), Arrhythmia, Weeks Of Treatment: 15 History: Coronary Artery Disease, Hypertension, Type II Diabetes Clustered Wound: No Photos Wound Measurements Length: (cm) 0.1 Width: (cm) 0.3 Depth: (cm) 0.1  Area: (cm) 0.024 Volume: (cm) 0.002 % Reduction in Area: 99.7% % Reduction in Volume: 99.9% Epithelialization: None Wound Description Classification: Full Thickness Without Exposed Suppor Exudate Amount: Medium Exudate Type: Serosanguineous Exudate Color: red, brown t Structures Foul Odor After  Cleansing: No Slough/Fibrino No Wound Bed Granulation Amount: Large (67-100%) Exposed Structure Granulation Quality: Red Fascia Exposed: No Necrotic Amount: None Present (0%) Fat Layer (Subcutaneous Tissue) Exposed: Yes Tendon Exposed: No Muscle Exposed: No JARRARD, HINDLEY ([id number]) [id number] Page 8 of 8 Joint Exposed: No Bone Exposed: No Treatment Notes Wound #2 (Gluteus) Wound Laterality: Right Cleanser Soap and Water Discharge Instruction: Gently cleanse wound with antibacterial soap, rinse and pat dry prior to dressing wounds Peri-Wound Care Topical Primary Dressing Hydrofera Blue Ready Transfer Foam, 2.5x2.5 (in/in) Discharge Instruction: Apply Hydrofera Blue Ready to wound bed as directed Secondary Dressing T Adhesive [employer], 4x4 (in/in) elfa Discharge Instruction: Apply over dressing to secure in place. Secured With Compression Wrap Compression Stockings Add-Ons Electronic Signature(s) Signed: 12/16/2022 12:39:59 PM By: Betha Loa Signed: 12/16/2022 1:30:41 PM By: Angelina Pih Entered By: Betha Loa on 12/16/2022 11:59:20 -------------------------------------------------------------------------------- Vitals Details Patient Name: Date of Service: Tracie Harrier, JA MES F. 12/16/2022 12:00 PM Medical Record Number: [id number] Patient Account Number: [id number] Date of Birth/Sex: Treating RN: 1955-09-09 (67 y.o. Larry Callahan Primary Care Katyra Tomassetti: Sallyanne Kuster Other Clinician: Betha Loa Referring Lessa Huge: Treating Kynzley Dowson/Extender: Weldon Inches Weeks in Treatment: 17 Vital Signs Time Taken: 11:53 Temperature (F): 98.2 Height (in): 71 Pulse (bpm): 91 Weight (lbs): 229 Respiratory Rate (breaths/min): 18 Body Mass Index (BMI): 31.9 Blood Pressure (mmHg): 142/88 Reference Range: 80 - 120 mg / dl Electronic Signature(s) Signed: 12/16/2022 12:39:59 PM By: Betha Loa Entered By:  Betha Loa on 12/16/2022 11:55:15

## 2022-12-19 DIAGNOSIS — I2510 Atherosclerotic heart disease of native coronary artery without angina pectoris: Secondary | ICD-10-CM | POA: Diagnosis not present

## 2022-12-19 DIAGNOSIS — E785 Hyperlipidemia, unspecified: Secondary | ICD-10-CM | POA: Diagnosis not present

## 2022-12-19 DIAGNOSIS — Z602 Problems related to living alone: Secondary | ICD-10-CM | POA: Diagnosis not present

## 2022-12-19 DIAGNOSIS — L0231 Cutaneous abscess of buttock: Secondary | ICD-10-CM | POA: Diagnosis not present

## 2022-12-19 DIAGNOSIS — J449 Chronic obstructive pulmonary disease, unspecified: Secondary | ICD-10-CM | POA: Diagnosis not present

## 2022-12-19 DIAGNOSIS — G4733 Obstructive sleep apnea (adult) (pediatric): Secondary | ICD-10-CM | POA: Diagnosis not present

## 2022-12-19 DIAGNOSIS — I110 Hypertensive heart disease with heart failure: Secondary | ICD-10-CM | POA: Diagnosis not present

## 2022-12-19 DIAGNOSIS — I509 Heart failure, unspecified: Secondary | ICD-10-CM | POA: Diagnosis not present

## 2022-12-19 DIAGNOSIS — E119 Type 2 diabetes mellitus without complications: Secondary | ICD-10-CM | POA: Diagnosis not present

## 2022-12-20 ENCOUNTER — Ambulatory Visit
Admission: RE | Admit: 2022-12-20 | Discharge: 2022-12-20 | Disposition: A | Discharge status: Home / Self Care | Payer: Medicare PPO | Attending: Nurse Practitioner | Admitting: Nurse Practitioner

## 2022-12-20 ENCOUNTER — Ambulatory Visit
Admission: RE | Admit: 2022-12-20 | Discharge: 2022-12-20 | Disposition: A | Discharge status: Home / Self Care | Payer: Medicare PPO | Source: Ambulatory Visit | Attending: Nurse Practitioner | Admitting: Nurse Practitioner

## 2022-12-20 DIAGNOSIS — M25561 Pain in right knee: Secondary | ICD-10-CM | POA: Insufficient documentation

## 2022-12-20 DIAGNOSIS — M25562 Pain in left knee: Secondary | ICD-10-CM

## 2022-12-20 DIAGNOSIS — G8929 Other chronic pain: Secondary | ICD-10-CM | POA: Diagnosis not present

## 2022-12-20 DIAGNOSIS — M170 Bilateral primary osteoarthritis of knee: Secondary | ICD-10-CM | POA: Diagnosis not present

## 2022-12-20 DIAGNOSIS — M85862 Other specified disorders of bone density and structure, left lower leg: Secondary | ICD-10-CM | POA: Diagnosis not present

## 2022-12-21 ENCOUNTER — Other Ambulatory Visit: Payer: Self-pay | Admitting: Internal Medicine

## 2022-12-21 DIAGNOSIS — E119 Type 2 diabetes mellitus without complications: Secondary | ICD-10-CM | POA: Diagnosis not present

## 2022-12-21 DIAGNOSIS — Z602 Problems related to living alone: Secondary | ICD-10-CM | POA: Diagnosis not present

## 2022-12-21 DIAGNOSIS — J449 Chronic obstructive pulmonary disease, unspecified: Secondary | ICD-10-CM | POA: Diagnosis not present

## 2022-12-21 DIAGNOSIS — L0231 Cutaneous abscess of buttock: Secondary | ICD-10-CM | POA: Diagnosis not present

## 2022-12-21 DIAGNOSIS — I5043 Acute on chronic combined systolic (congestive) and diastolic (congestive) heart failure: Secondary | ICD-10-CM | POA: Diagnosis not present

## 2022-12-21 DIAGNOSIS — G4733 Obstructive sleep apnea (adult) (pediatric): Secondary | ICD-10-CM | POA: Diagnosis not present

## 2022-12-21 DIAGNOSIS — E785 Hyperlipidemia, unspecified: Secondary | ICD-10-CM | POA: Diagnosis not present

## 2022-12-21 DIAGNOSIS — I2510 Atherosclerotic heart disease of native coronary artery without angina pectoris: Secondary | ICD-10-CM | POA: Diagnosis not present

## 2022-12-21 DIAGNOSIS — I110 Hypertensive heart disease with heart failure: Secondary | ICD-10-CM | POA: Diagnosis not present

## 2022-12-21 DIAGNOSIS — I509 Heart failure, unspecified: Secondary | ICD-10-CM | POA: Diagnosis not present

## 2022-12-21 DIAGNOSIS — K219 Gastro-esophageal reflux disease without esophagitis: Secondary | ICD-10-CM

## 2022-12-23 ENCOUNTER — Encounter: Payer: Medicare PPO | Admitting: Physician Assistant

## 2022-12-23 DIAGNOSIS — L0231 Cutaneous abscess of buttock: Secondary | ICD-10-CM | POA: Diagnosis not present

## 2022-12-23 DIAGNOSIS — S61211A Laceration without foreign body of left index finger without damage to nail, initial encounter: Secondary | ICD-10-CM | POA: Diagnosis not present

## 2022-12-23 DIAGNOSIS — L98492 Non-pressure chronic ulcer of skin of other sites with fat layer exposed: Secondary | ICD-10-CM | POA: Diagnosis not present

## 2022-12-23 NOTE — Progress Notes (Addendum)
Larry Callahan ([id number]) [id number] Page 1 of 8 Visit Report for 12/23/2022 Arrival Information Details Patient Name: Date of Service: Larry Callahan MES F. 12/23/2022 9:00 A M Medical Record Number: [id number] Patient Account Number: [id number] Date of Birth/Sex: Treating RN: 01/21/1956 (67 y.o. [name]) Yevonne Pax Primary Care [place] Year[place]: Sallyanne Kuster Other Clinician: Referring Carmela Piechowski: Treating Skilynn Durney/Extender: Weldon Inches Weeks in Treatment: 18 Visit Information History Since Last Visit Added or deleted any medications: No Patient Arrived: Ambulatory Any new allergies or adverse reactions: No Arrival Time: 09:10 Had a fall or experienced change in No Accompanied By: self activities of daily living that may affect Transfer Assistance: None risk of falls: Patient Identification Verified: Yes Signs or symptoms of abuse/neglect since last visito No Secondary Verification Process Completed: Yes Hospitalized since last visit: No Patient Requires Transmission-Based Precautions: No Implantable device outside of the clinic excluding No Patient Has Alerts: Yes cellular tissue based products placed in the center Patient Alerts: Patient on Blood Thinner since last visit: Has Dressing in Place as Prescribed: Yes Pain Present Now: No Electronic Signature(s) Signed: 12/23/2022 9:17:48 AM By: Yevonne Pax RN Entered By: Yevonne Pax on 12/23/2022 09:17:48 -------------------------------------------------------------------------------- Clinic Level of Care Assessment Details Patient Name: Date of Service: Larry Callahan MES F. 12/23/2022 9:00 A M Medical Record Number: [id number] Patient Account Number: [id number] Date of Birth/Sex: Treating RN: [birth year] (67 y.o. [name]) Yevonne Pax Primary Care Keene Gilkey: Sallyanne Kuster Other Clinician: Referring Kyley Laurel: Treating Marven Veley/Extender: Weldon Inches Weeks in Treatment:  18 Clinic Level of Care Assessment Items TOOL 4 Quantity Score X- 1 0 Use when only an EandM is performed on FOLLOW-UP visit ASSESSMENTS - Nursing Assessment / Reassessment X- 1 10 Reassessment of Co-morbidities (includes updates in patient status) X- 1 5 Reassessment of Adherence to Treatment Plan Larry Callahan, Larry Callahan ([id number]) [id number] Page 2 of 8 ASSESSMENTS - Wound and Skin A ssessment / Reassessment X - Simple Wound Assessment / Reassessment - one wound 1 5 []  - 0 Complex Wound Assessment / Reassessment - multiple wounds []  - 0 Dermatologic / Skin Assessment (not related to wound area) ASSESSMENTS - Focused Assessment []  - 0 Circumferential Edema Measurements - multi extremities []  - 0 Nutritional Assessment / Counseling / Intervention []  - 0 Lower Extremity Assessment (monofilament, tuning fork, pulses) []  - 0 Peripheral Arterial Disease Assessment (using hand held doppler) ASSESSMENTS - Ostomy and/or Continence Assessment and Care []  - 0 Incontinence Assessment and Management []  - 0 Ostomy Care Assessment and Management (repouching, etc.) PROCESS - Coordination of Care X - Simple Patient / Family Education for ongoing care 1 15 []  - 0 Complex (extensive) Patient / Family Education for ongoing care []  - 0 Staff obtains Chiropractor, Records, T Results / Process Orders est []  - 0 Staff telephones HHA, Nursing Homes / Clarify orders / etc []  - 0 Routine Transfer to another Facility (non-emergent condition) []  - 0 Routine Hospital Admission (non-emergent condition) []  - 0 New Admissions / Manufacturing engineer / Ordering NPWT Apligraf, etc. , []  - 0 Emergency Hospital Admission (emergent condition) X- 1 10 Simple Discharge Coordination []  - 0 Complex (extensive) Discharge Coordination PROCESS - Special Needs []  - 0 Pediatric / Minor Patient Management []  - 0 Isolation Patient Management []  - 0 Hearing / Language / Visual special  needs []  - 0 Assessment of Community assistance (transportation, D/C planning, etc.) []  - 0 Additional assistance / Altered mentation []  - 0 Support Surface(s) Assessment (bed, cushion, seat, etc.) INTERVENTIONS -  Wound Cleansing / Measurement X - Simple Wound Cleansing - one wound 1 5 []  - 0 Complex Wound Cleansing - multiple wounds X- 1 5 Wound Imaging (photographs - any number of wounds) []  - 0 Wound Tracing (instead of photographs) X- 1 5 Simple Wound Measurement - one wound []  - 0 Complex Wound Measurement - multiple wounds INTERVENTIONS - Wound Dressings X - Small Wound Dressing one or multiple wounds 1 10 []  - 0 Medium Wound Dressing one or multiple wounds []  - 0 Large Wound Dressing one or multiple wounds []  - 0 Application of Medications - topical []  - 0 Application of Medications - injection INTERVENTIONS - Miscellaneous []  - 0 External ear exam Larry Callahan ([id number]) [id number] Page 3 of 8 []  - 0 Specimen Collection (cultures, biopsies, blood, body fluids, etc.) []  - 0 Specimen(s) / Culture(s) sent or taken to Lab for analysis []  - 0 Patient Transfer (multiple staff / Michiel Sites Lift / Similar devices) []  - 0 Simple Staple / Suture removal (25 or less) []  - 0 Complex Staple / Suture removal (26 or more) []  - 0 Hypo / Hyperglycemic Management (close monitor of Blood Glucose) []  - 0 Ankle / Brachial Index (ABI) - do not check if billed separately X- 1 5 Vital Signs Has the patient been seen at the hospital within the last three years: Yes Total Score: 75 Level Of Care: New/Established - Level 2 Electronic Signature(s) Signed: 12/29/2022 3:42:23 PM By: Yevonne Pax RN Entered By: Yevonne Pax on 12/23/2022 09:29:20 -------------------------------------------------------------------------------- Encounter Discharge Information Details Patient Name: Date of Service: Larry Callahan MES F. 12/23/2022 9:00 A M Medical Record  Number: [id number] Patient Account Number: [id number] Date of Birth/Sex: Treating RN: 1956-05-16 (67 y.o. Larry Callahan Primary Care Rosangelica Pevehouse: Sallyanne Kuster Other Clinician: Referring Voyd Groft: Treating Mateusz Neilan/Extender: Weldon Inches Weeks in Treatment: 18 Encounter Discharge Information Items Discharge Condition: Stable Ambulatory Status: Ambulatory Discharge Destination: Home Transportation: Private Auto Accompanied By: self Schedule Follow-up Appointment: Yes Clinical Summary of Care: Electronic Signature(s) Signed: 12/23/2022 9:30:22 AM By: Yevonne Pax RN Entered By: Yevonne Pax on 12/23/2022 09:30:22 -------------------------------------------------------------------------------- Lower Extremity Assessment Details Patient Name: Date of Service: Larry Callahan MES F. 12/23/2022 9:00 A Larry Callahan, Larry Callahan ([id number]) [id number] Page 4 of 8 Medical Record Number: [id number] Patient Account Number: [id number] Date of Birth/Sex: Treating RN: 17-Sep-1955 (67 y.o. [name]) Yevonne Pax Primary Care Rashanda Magloire: Sallyanne Kuster Other Clinician: Referring Radley Barto: Treating Kateria Cutrona/Extender: Weldon Inches Weeks in Treatment: 18 Electronic Signature(s) Signed: 12/23/2022 9:28:21 AM By: Yevonne Pax RN Entered By: Yevonne Pax on 12/23/2022 09:28:20 -------------------------------------------------------------------------------- Multi Wound Chart Details Patient Name: Date of Service: Larry Callahan MES F. 12/23/2022 9:00 A M Medical Record Number: [id number] Patient Account Number: [id number] Date of Birth/Sex: Treating RN: 1955/09/14 (67 y.o. [name]) Yevonne Pax Primary Care Saharsh Sterling: Sallyanne Kuster Other Clinician: Referring Marieliz Strang: Treating Rhone Ozaki/Extender: Weldon Inches Weeks in Treatment: 18 Vital Signs Height(in): 71 Pulse(bpm): 66 Weight(lbs): 229 Blood Pressure(mmHg): 118/74 Body Mass Index(BMI):  31.9 Temperature(F): 98.2 Respiratory Rate(breaths/min): 18 [2:Photos:] [N/A:N/A] Right Gluteus N/A N/A Wound Location: Gradually Appeared N/A N/A Wounding Event: Abscess N/A N/A Primary Etiology: Chronic Obstructive Pulmonary N/A N/A Comorbid History: Disease (COPD), Arrhythmia, Coronary Artery Disease, Hypertension, Type II Diabetes 08/29/2022 N/A N/A Date Acquired: 16 N/A N/A Weeks of Treatment: Open N/A N/A Wound Status: No N/A N/A Wound Recurrence: 0.1x0.1x0.1 N/A N/A Measurements L x W x D (cm) 0.008 N/A N/A A (cm) : rea 0.001 N/A  N/A Volume (cm) : 99.90% N/A N/A % Reduction in Area: 100.00% N/A N/A % Reduction in Volume: Full Thickness Without Exposed N/A N/A Classification: Support Structures Medium N/A N/A Exudate Amount: Serosanguineous N/A N/A Exudate Type: red, brown N/A N/A Exudate Color: Large (67-100%) N/A N/A Granulation Amount: Red N/A N/A Granulation Quality: None Present (0%) N/A N/A Necrotic Amount: Fat Layer (Subcutaneous Tissue): Yes N/A N/A Exposed Structures: Fascia: No Larry Callahan, Larry Callahan ([id number]) [id number] Page 5 of 8 Tendon: No Muscle: No Joint: No Bone: No None N/A N/A Epithelialization: Treatment Notes Electronic Signature(s) Signed: 12/23/2022 9:28:25 AM By: Yevonne Pax RN Entered By: Yevonne Pax on 12/23/2022 56:21:30 -------------------------------------------------------------------------------- Multi-Disciplinary Care Plan Details Patient Name: Date of Service: Larry Callahan MES F. 12/23/2022 9:00 A M Medical Record Number: [id number] Patient Account Number: [id number] Date of Birth/Sex: Treating RN: 1955-06-20 (67 y.o. [name]) Yevonne Pax Primary Care Yuepheng Schaller: Sallyanne Kuster Other Clinician: Referring Enrique Manganaro: Treating Oneida Mckamey/Extender: Weldon Inches Weeks in Treatment: 18 Active Inactive Necrotic Tissue Nursing Diagnoses: Knowledge deficit related to management  of necrotic/devitalized tissue Goals: Necrotic/devitalized tissue will be minimized in the wound bed Date Initiated: 09/01/2022 Target Resolution Date: 02/01/2023 Goal Status: Active Interventions: Assess patient pain level pre-, during and post procedure and prior to discharge Provide education on necrotic tissue and debridement process Notes: Electronic Signature(s) Signed: 12/23/2022 9:29:39 AM By: Yevonne Pax RN Entered By: Yevonne Pax on 12/23/2022 09:29:39 Pain Assessment Details -------------------------------------------------------------------------------- Larry Callahan ([id number]) [id number] Page 6 of 8 Patient Name: Date of Service: Larry Callahan MES F. 12/23/2022 9:00 A M Medical Record Number: [id number] Patient Account Number: [id number] Date of Birth/Sex: Treating RN: 1955-12-17 (67 y.o. [name]) Yevonne Pax Primary Care Raileigh Sabater: Sallyanne Kuster Other Clinician: Referring Olden Klauer: Treating Ebonique Hallstrom/Extender: Weldon Inches Weeks in Treatment: 18 Active Problems Location of Pain Severity and Description of Pain Patient Has Paino No Site Locations Pain Management and Medication Current Pain Management: Electronic Signature(s) Signed: 12/23/2022 9:18:16 AM By: Yevonne Pax RN Entered By: Yevonne Pax on 12/23/2022 09:18:15 -------------------------------------------------------------------------------- Patient/Caregiver Education Details Patient Name: Date of Service: Larry Callahan MES F. 7/12/2024andnbsp9:00 A M Medical Record Number: [id number] Patient Account Number: [id number] Date of Birth/Gender: Treating RN: [birth year] (67 y.o. Larry Callahan Primary Care Physician: Sallyanne Kuster Other Clinician: Referring Physician: Treating Physician/Extender: Weldon Inches Weeks in Treatment: 31 Education Assessment Education Provided To: Patient Education Topics Provided Pressure: Handouts: Pressure  Injury: Prevention and Offloading Methods: Explain/Verbal Responses: State content correctly Larry Callahan, Larry Callahan ([id number]) [id number] Page 7 of 8 Electronic Signature(s) Signed: 12/29/2022 3:42:23 PM By: Yevonne Pax RN Entered By: Yevonne Pax on 12/23/2022 09:29:52 -------------------------------------------------------------------------------- Wound Assessment Details Patient Name: Date of Service: Larry Callahan MES F. 12/23/2022 9:00 A M Medical Record Number: [id number] Patient Account Number: [id number] Date of Birth/Sex: Treating RN: 03-07-1956 (67 y.o. [name]) Yevonne Pax Primary Care Arles Rumbold: Sallyanne Kuster Other Clinician: Referring Etosha Wetherell: Treating Kieara Schwark/Extender: Weldon Inches Weeks in Treatment: 18 Wound Status Wound Number: 2 Primary Abscess Etiology: Wound Location: Right Gluteus Wound Open Wounding Event: Gradually Appeared Status: Date Acquired: 08/29/2022 Comorbid Chronic Obstructive Pulmonary Disease (COPD), Arrhythmia, Weeks Of Treatment: 16 History: Coronary Artery Disease, Hypertension, Type II Diabetes Clustered Wound: No Photos Wound Measurements Length: (cm) 0.1 Width: (cm) 0.1 Depth: (cm) 0.1 Area: (cm) 0.008 Volume: (cm) 0.001 % Reduction in Area: 99.9% % Reduction in Volume: 100% Epithelialization: None Tunneling: No Undermining: No Wound Description Classification: Full Thickness Without Exposed Suppor Exudate Amount: Medium Exudate Type:  Serosanguineous Exudate Color: red, brown t Structures Foul Odor After Cleansing: No Slough/Fibrino No Wound Bed Granulation Amount: Large (67-100%) Exposed Structure Granulation Quality: Red Fascia Exposed: No Necrotic Amount: None Present (0%) Fat Layer (Subcutaneous Tissue) Exposed: Yes Tendon Exposed: No Muscle Exposed: No Joint Exposed: No Bone Exposed: No Treatment Notes Larry Callahan, Larry Callahan ([id number]) [id number] Page 8 of  8 Wound #2 (Gluteus) Wound Laterality: Right Cleanser Soap and Water Discharge Instruction: Gently cleanse wound with antibacterial soap, rinse and pat dry prior to dressing wounds Peri-Wound Care Topical Primary Dressing Hydrofera Blue Ready Transfer Foam, 2.5x2.5 (in/in) Discharge Instruction: Apply Hydrofera Blue Ready to wound bed as directed Secondary Dressing T Adhesive [employer], 4x4 (in/in) elfa Discharge Instruction: Apply over dressing to secure in place. Secured With Compression Wrap Compression Stockings Facilities manager) Signed: 12/23/2022 9:28:11 AM By: Yevonne Pax RN Entered By: Yevonne Pax on 12/23/2022 09:28:11 -------------------------------------------------------------------------------- Vitals Details Patient Name: Date of Service: Larry Callahan, Larry Callahan MES F. 12/23/2022 9:00 A M Medical Record Number: [id number] Patient Account Number: [id number] Date of Birth/Sex: Treating RN: 1955-12-23 (67 y.o. [name]) Yevonne Pax Primary Care Flecia Shutter: Sallyanne Kuster Other Clinician: Referring Margreat Widener: Treating Timberlyn Pickford/Extender: Weldon Inches Weeks in Treatment: 18 Vital Signs Time Taken: 09:17 Temperature (F): 98.2 Height (in): 71 Pulse (bpm): 66 Weight (lbs): 229 Respiratory Rate (breaths/min): 18 Body Mass Index (BMI): 31.9 Blood Pressure (mmHg): 118/74 Reference Range: 80 - 120 mg / dl Electronic Signature(s) Signed: 12/23/2022 9:18:08 AM By: Yevonne Pax RN Entered By: Yevonne Pax on 12/23/2022 09:18:08

## 2022-12-26 DIAGNOSIS — Z602 Problems related to living alone: Secondary | ICD-10-CM | POA: Diagnosis not present

## 2022-12-26 DIAGNOSIS — E119 Type 2 diabetes mellitus without complications: Secondary | ICD-10-CM | POA: Diagnosis not present

## 2022-12-26 DIAGNOSIS — G4733 Obstructive sleep apnea (adult) (pediatric): Secondary | ICD-10-CM | POA: Diagnosis not present

## 2022-12-26 DIAGNOSIS — I2510 Atherosclerotic heart disease of native coronary artery without angina pectoris: Secondary | ICD-10-CM | POA: Diagnosis not present

## 2022-12-26 DIAGNOSIS — I509 Heart failure, unspecified: Secondary | ICD-10-CM | POA: Diagnosis not present

## 2022-12-26 DIAGNOSIS — L0231 Cutaneous abscess of buttock: Secondary | ICD-10-CM | POA: Diagnosis not present

## 2022-12-26 DIAGNOSIS — I110 Hypertensive heart disease with heart failure: Secondary | ICD-10-CM | POA: Diagnosis not present

## 2022-12-26 DIAGNOSIS — J449 Chronic obstructive pulmonary disease, unspecified: Secondary | ICD-10-CM | POA: Diagnosis not present

## 2022-12-26 DIAGNOSIS — E785 Hyperlipidemia, unspecified: Secondary | ICD-10-CM | POA: Diagnosis not present

## 2022-12-28 DIAGNOSIS — I509 Heart failure, unspecified: Secondary | ICD-10-CM | POA: Diagnosis not present

## 2022-12-28 DIAGNOSIS — I2510 Atherosclerotic heart disease of native coronary artery without angina pectoris: Secondary | ICD-10-CM | POA: Diagnosis not present

## 2022-12-28 DIAGNOSIS — G4733 Obstructive sleep apnea (adult) (pediatric): Secondary | ICD-10-CM | POA: Diagnosis not present

## 2022-12-28 DIAGNOSIS — E785 Hyperlipidemia, unspecified: Secondary | ICD-10-CM | POA: Diagnosis not present

## 2022-12-28 DIAGNOSIS — L0231 Cutaneous abscess of buttock: Secondary | ICD-10-CM | POA: Diagnosis not present

## 2022-12-28 DIAGNOSIS — I110 Hypertensive heart disease with heart failure: Secondary | ICD-10-CM | POA: Diagnosis not present

## 2022-12-28 DIAGNOSIS — J449 Chronic obstructive pulmonary disease, unspecified: Secondary | ICD-10-CM | POA: Diagnosis not present

## 2022-12-28 DIAGNOSIS — Z602 Problems related to living alone: Secondary | ICD-10-CM | POA: Diagnosis not present

## 2022-12-28 DIAGNOSIS — E119 Type 2 diabetes mellitus without complications: Secondary | ICD-10-CM | POA: Diagnosis not present

## 2022-12-29 ENCOUNTER — Ambulatory Visit: Payer: Medicare PPO | Admitting: Nurse Practitioner

## 2022-12-29 ENCOUNTER — Encounter: Payer: Self-pay | Admitting: Nurse Practitioner

## 2022-12-29 ENCOUNTER — Telehealth: Payer: Self-pay | Admitting: Nurse Practitioner

## 2022-12-29 VITALS — BP 130/70 | HR 65 | Temp 98.1°F | Resp 16 | Ht 71.0 in | Wt 202.6 lb

## 2022-12-29 DIAGNOSIS — G8929 Other chronic pain: Secondary | ICD-10-CM

## 2022-12-29 DIAGNOSIS — G4733 Obstructive sleep apnea (adult) (pediatric): Secondary | ICD-10-CM

## 2022-12-29 DIAGNOSIS — E1122 Type 2 diabetes mellitus with diabetic chronic kidney disease: Secondary | ICD-10-CM

## 2022-12-29 LAB — POCT GLYCOSYLATED HEMOGLOBIN (HGB A1C): Hemoglobin A1C: 5.8 % — AB (ref 4.0–5.6)

## 2022-12-29 MED ORDER — METHYLPREDNISOLONE ACETATE 80 MG/ML IJ SUSP
80.0000 mg | Freq: Once | INTRAMUSCULAR | Status: AC
Start: 2022-12-29 — End: 2022-12-29
  Administered 2022-12-29: 40 mg via INTRAMUSCULAR

## 2022-12-29 NOTE — Progress Notes (Signed)
[facility] [address] [place], [place] [address]  Internal MEDICINE  Office Visit Note  Patient Name: Larry Callahan  [id number]  [id number]  Date of Service: 12/29/2022  Chief Complaint  Patient presents with   Diabetes   Gastroesophageal Reflux   Hyperlipidemia   Hypertension    HPI Bow presents for a follow-up visit for diabetes, OSA, chronic knee pain.  Knee xrays -- bilateral tricompartmental degenerative changes  Sleep apnea -- uses [facility] -- says that they are not coming to service his equipment, his strap broke, mask broke, has tubing and says they told him he needs to come and bring all of his stuff to them.  Diabetes -- A1c is 5.8 today, stable.  Upset stomach -- may take maalox.     Current Medication: Outpatient Encounter Medications as of 12/29/2022  Medication Sig   Accu-Chek Softclix Lancets lancets Use to check blood sugars 4 times a day   E11.65   acetaminophen (TYLENOL) 325 MG tablet Take 2 tablets (650 mg total) by mouth every 6 (six) hours as needed for mild pain or moderate pain (or Fever >/= 101).   acetaminophen-codeine (TYLENOL #3) 300-30 MG tablet Take one tab po at night for left shoulder pain   alum & mag hydroxide-simeth (MAALOX/MYLANTA) 200-200-20 MG/5ML suspension Take 30 mLs by mouth every 4 (four) hours as needed for indigestion or heartburn.   apixaban (ELIQUIS) 5 MG TABS tablet Take 1 tablet (5 mg total) by mouth 2 (two) times daily.   bisacodyl (DULCOLAX) 5 MG EC tablet Take 2 tablets (10 mg total) by mouth daily as needed for moderate constipation.   Blood Glucose Monitoring Suppl (TRUE METRIX AIR GLUCOSE METER) DEVI 1 Device by Does not apply route 2 (two) times daily.   Budeson-Glycopyrrol-Formoterol (BREZTRI AEROSPHERE) 160-9-4.8 MCG/ACT AERO Inhale 2 puffs into the lungs 2 (two) times daily. (Patient taking differently: Inhale 2 puffs into the lungs 2 (two) times daily as needed.)   carvedilol (COREG) 12.5 MG tablet  Take 1 tablet (12.5 mg total) by mouth 2 (two) times daily.   cyanocobalamin 1000 MCG tablet Take 1 tablet (1,000 mcg total) by mouth daily.   diclofenac Sodium (VOLTAREN ARTHRITIS PAIN) 1 % GEL Apply to left arm and shoulder twice daily as needed for pain   furosemide (LASIX) 40 MG tablet Take 1 tablet (40 mg total) by mouth daily.   glimepiride (AMARYL) 2 MG tablet Take 1 tablet (2 mg total) by mouth daily.   glucose blood (ACCU-CHEK GUIDE) test strip Use to check blood sugars 4 times a day   E11.65   hydrALAZINE (APRESOLINE) 100 MG tablet Take 1 tablet (100 mg total) by mouth 3 (three) times daily.   insulin glargine, 2 Unit Dial, (TOUJEO MAX SOLOSTAR) 300 UNIT/ML Solostar Pen Inject 20 units in am   Insulin Pen Needle (NOVOFINE PEN NEEDLE) 32G X 6 MM MISC Use as directed with toujeo pen   ipratropium-albuterol (DUONEB) 0.5-2.5 (3) MG/3ML SOLN Take 3 mLs by nebulization every 6 (six) hours as needed.   methocarbamol (ROBAXIN) 750 MG tablet Take 1 tablet (750 mg total) by mouth every 6 (six) hours as needed for muscle spasms.   omeprazole (PRILOSEC) 40 MG capsule Take 1 capsule once in the morning and once in the evening   ondansetron (ZOFRAN-ODT) 4 MG disintegrating tablet Take 1 tablet (4 mg total) by mouth every 6 (six) hours as needed for nausea or vomiting.   polyethylene glycol (MIRALAX) 17 g packet Take  17 g by mouth daily.   potassium chloride SA (KLOR-CON M) 20 MEQ tablet Take 1 tablet (20 mEq total) by mouth 2 (two) times daily.   rosuvastatin (CRESTOR) 10 MG tablet Take 1 tablet (10 mg total) by mouth at bedtime.   TOUJEO SOLOSTAR 300 UNIT/ML Solostar Pen Inject 26 Units into the skin daily.   iron polysaccharides (NIFEREX) 150 MG capsule Take 1 capsule (150 mg total) by mouth daily.   Facility-Administered Encounter Medications as of 12/29/2022  Medication   methylPREDNISolone acetate (DEPO-MEDROL) injection 80 mg    Surgical History: Past Surgical History:  Procedure  Laterality Date   CARDIAC CATHETERIZATION     CHOLECYSTECTOMY     COLONOSCOPY WITH PROPOFOL N/A 04/12/2019   Procedure: COLONOSCOPY WITH PROPOFOL;  Surgeon: Toney Reil, MD;  Location: [name] ENDOSCOPY;  Service: Gastroenterology;  Laterality: N/A;   ESOPHAGOGASTRODUODENOSCOPY N/A 04/12/2019   Procedure: ESOPHAGOGASTRODUODENOSCOPY (EGD);  Surgeon: Toney Reil, MD;  Location: [facility] ENDOSCOPY;  Service: Gastroenterology;  Laterality: N/A;   ESOPHAGOGASTRODUODENOSCOPY (EGD) WITH PROPOFOL N/A 11/21/2019   Procedure: ESOPHAGOGASTRODUODENOSCOPY (EGD) WITH PROPOFOL;  Surgeon: Toney Reil, MD;  Location[facility] ENDOSCOPY;  Service: Gastroenterology;  Laterality: N/A;   ESOPHAGOGASTRODUODENOSCOPY (EGD) WITH PROPOFOL N/A 01/11/2022   Procedure: ESOPHAGOGASTRODUODENOSCOPY (EGD) WITH PROPOFOL;  Surgeon: Midge Minium, MD;  Location: [name] ENDOSCOPY;  Service: Endoscopy;  Laterality: N/A;   ESOPHAGOGASTRODUODENOSCOPY (EGD) WITH PROPOFOL N/A 08/02/2022   Procedure: ESOPHAGOGASTRODUODENOSCOPY (EGD) WITH PROPOFOL;  Surgeon: Midge Minium, MD;  Location: [facility] ENDOSCOPY;  Service: Endoscopy;  Laterality: N/A;   FLEXIBLE BRONCHOSCOPY Bilateral 05/17/2019   Procedure: FLEXIBLE BRONCHOSCOPY;  Surgeon: Yevonne Pax, MD;  Location: [name] ORS;  Service: Pulmonary;  Laterality: Bilateral;   left arm surgery     nephrectomy Left    PARATHYROIDECTOMY     RIGHT HEART CATH N/A 03/29/2019   Procedure: RIGHT HEART CATH;  Surgeon: Antonieta Iba, MD;  Location: [name] INVASIVE CV LAB;  Service: Cardiovascular;  Laterality: N/A;    Medical History: Past Medical History:  Diagnosis Date   Allergy    Atrial flutter (HCC)    a. Dx 12/2020--CHA2DS2VASc = 5-->eliquis.   Chronic combined systolic (congestive) and diastolic (congestive) heart failure (HCC)    a. 04/2019 Echo: EF 45-50%; b. 02/2019 Echo: EF 55-60%; c. 09/2020 Echo: EF 35-40%, glob HK. Nl RV size/fxn. Mild MR; d. 07/2021 Echo: EF 40-45%, mild LVH,  low-nl RV fxn, no MR.   CKD (chronic kidney disease), stage III (HCC)    COPD (chronic obstructive pulmonary disease) (HCC)    Coronary artery disease    a. 2011 Cath: nonobs dzs; b. 09/2020 MV: EF 38%. No signif ischemia. ? inf MI vs diaph attenuation. GI uptake artifact.   Diabetes mellitus without complication (HCC)    GERD (gastroesophageal reflux disease)    History of CVA (cerebrovascular accident) 09/25/2020   Hyperlipidemia    Hypertension    Morbid (severe) obesity due to excess calories (HCC) 06/09/2017   Morbid obesity (HCC)    NICM (nonischemic cardiomyopathy) (HCC)    a. 04/2019 Echo: EF 45-50%; b. 02/2019 Echo: EF 55-60%; c. 09/2020 Echo: EF 35-40%, glob HK; d. 09/2020 MV: No ischemia. ? inf infarct vs attenuation; d. 07/2021 Echo: EF 40-45%.   OSA (obstructive sleep apnea)    Pneumonia due to COVID-19 virus 01/30/2020   Stage 3b chronic kidney disease (HCC)    Syncope and collapse 11/16/2020    Family History: Family History  Problem Relation Age of Onset  Diabetes Mother        2003   Lung cancer Father    Diabetes Sister    Hypertension Sister    Heart disease Sister    Cancer Sister    Bone cancer Brother    Prostate cancer Neg Hx    Bladder Cancer Neg Hx    Kidney cancer Neg Hx     Social History   Socioeconomic History   Marital status: Single    Spouse name: Not on file   Number of children: Not on file   Years of education: Not on file   Highest education level: Not on file  Occupational History   Not on file  Tobacco Use   Smoking status: Former    Current packs/day: 0.00    Types: Cigarettes    Quit date: 03/13/2020    Years since quitting: 2.7   Smokeless tobacco: Former    Types: Engineer, drilling   Vaping status: Never Used  Substance and Sexual Activity   Alcohol use: No   Drug use: No   Sexual activity: Not Currently  Other Topics Concern   Not on file  Social History Narrative   Not on file   Social Determinants of Health    Financial Resource Strain: Low Risk  (05/12/2021)   Overall Financial Resource Strain (CARDIA)    Difficulty of Paying Living Expenses: Not very hard  Food Insecurity: No Food Insecurity (08/01/2022)   Hunger Vital Sign    Worried About Running Out of Food in the Last Year: Never true    Ran Out of Food in the Last Year: Never true  Transportation Needs: No Transportation Needs (08/01/2022)   PRAPARE - Administrator, Civil Service (Medical): No    Lack of Transportation (Non-Medical): No  Physical Activity: Not on file  Stress: Not on file  Social Connections: Not on file  Intimate Partner Violence: Not At Risk (08/01/2022)   Humiliation, Afraid, Rape, and Kick questionnaire    Fear of Current or Ex-Partner: No    Emotionally Abused: No    Physically Abused: No    Sexually Abused: No      Review of Systems  Constitutional:  Negative for chills, fatigue and unexpected weight change.  HENT:  Negative for congestion, postnasal drip, rhinorrhea, sneezing and sore throat.   Eyes:  Negative for redness.  Respiratory: Negative.  Negative for cough, chest tightness, shortness of breath and wheezing.   Cardiovascular: Negative.  Negative for chest pain and palpitations.  Gastrointestinal:  Negative for abdominal pain, constipation, diarrhea, nausea and vomiting.  Genitourinary:  Negative for dysuria and frequency.  Musculoskeletal:  Positive for arthralgias (bilateral knee pain and right shoulder pain), gait problem, joint swelling and myalgias. Negative for back pain and neck pain.  Skin:  Negative for rash.  Neurological:  Negative for tremors and numbness.  Hematological:  Negative for adenopathy. Does not bruise/bleed easily.  Psychiatric/Behavioral:  Negative for behavioral problems (Depression), sleep disturbance and suicidal ideas. The patient is not nervous/anxious.     Vital Signs: BP 130/70   Pulse 65   Temp 98.1 F (36.7 C)   Resp 16   Ht 5\' 11"  (1.803 m)    Wt 202 lb 9.6 oz (91.9 kg)   SpO2 98%   BMI 28.26 kg/m    Physical Exam Vitals reviewed.  Constitutional:      Appearance: Normal appearance. He is obese.  HENT:     Head: Normocephalic and  atraumatic.  Eyes:     Pupils: Pupils are equal, round, and reactive to light.  Cardiovascular:     Rate and Rhythm: Normal rate and regular rhythm.  Pulmonary:     Effort: Pulmonary effort is normal. No respiratory distress.  Musculoskeletal:     Right knee: Swelling present. Decreased range of motion. Tenderness present.     Left knee: Swelling present. Decreased range of motion. Tenderness present.  Neurological:     Mental Status: He is alert and oriented to person, place, and time.  Psychiatric:        Mood and Affect: Mood normal.        Behavior: Behavior normal.        Assessment/Plan: 1. 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  - POCT glycosylated hemoglobin (Hb A1C)  2. Bilateral chronic knee pain Urgent referral to orthopedic surgery and depo medrol 40 mg IM injection done in office today - Ambulatory referral to Orthopedic Surgery - methylPREDNISolone acetate (DEPO-MEDROL) injection 80 mg  3. Obstructive sleep apnea, adult Order for supplies, patient unable to bring cpap and parts to [facility] office, unable to drive, needs [facility] to send someone to his home to service his machine please.  - For home use only DME continuous positive airway pressure (CPAP)    General Counseling: Fatima Sanger understanding of the findings of todays visit and agrees with plan of treatment. I have discussed any further diagnostic evaluation that may be needed or ordered today. We also reviewed his medications today. he has been encouraged to call the office with any questions or concerns that should arise related to todays visit.    Orders Placed This Encounter  Procedures   For home use  only DME continuous positive airway pressure (CPAP)   Ambulatory referral to Orthopedic Surgery   POCT glycosylated hemoglobin (Hb A1C)    Meds ordered this encounter  Medications   methylPREDNISolone acetate (DEPO-MEDROL) injection 80 mg    Return in about 4 months (around 05/01/2023) for F/U, Recheck A1C, Trumaine Wimer PCP.   Total time spent:30 Minutes Time spent includes review of chart, medications, test results, and follow up plan with the patient.   [place] Controlled Substance Database was reviewed by me.  This patient was seen by Sallyanne Kuster, FNP-C in collaboration with Dr. Beverely Risen as a part of collaborative care agreement.   Meliyah Simon R. Tedd Sias, MSN, FNP-C Internal medicine

## 2022-12-29 NOTE — Telephone Encounter (Addendum)
Urgent Orthopedic Surgery referral sent via [employer] to Dr. Rosita Kea w/ [facility]

## 2022-12-29 NOTE — Progress Notes (Signed)
Larry Callahan, Larry Callahan ([id number]) [id number] Page 1 of 8 Visit Report for 12/23/2022 Chief Complaint Document Details Patient Name: Date of Service: Larry Callahan. 12/23/2022 9:00 A M Medical Record Number: [id number] Patient Account Number: [id number] Date of Birth/Sex: Treating RN: 03/17/1956 (67 y.o. [name]) Yevonne Pax Primary Care Provider: Sallyanne Kuster Other Clinician: Referring Provider: Treating Provider/Extender: Weldon Inches Weeks in Treatment: 18 Information Obtained from: Patient Chief Complaint Right gluteal abscess and Left 2nd finger ulcer Electronic Signature(s) Signed: 12/23/2022 9:34:52 AM By: Allen Derry PA-C Entered By: Allen Derry on 12/23/2022 09:34:52 -------------------------------------------------------------------------------- HPI Details Patient Name: Date of Service: Larry Callahan. 12/23/2022 9:00 A M Medical Record Number: [id number] Patient Account Number: [id number] Date of Birth/Sex: Treating RN: November 21, 1955 (67 y.o. Larry Callahan Primary Care Provider: Sallyanne Kuster Other Clinician: Referring Provider: Treating Provider/Extender: Weldon Inches Weeks in Treatment: 43 History of Present Illness HPI Description: ADMISSION 08/15/2022 This is a 67 year old man who came in with very little medical information. There was some suggestion that he was in the hospital last month with cellulitis of the arm I will need to check this out thoroughly. He tells me that he received 2 courses of antibiotics last month were not really sure which ones they were but they are completed now. He is not really having any discomfort. His sister who lives nearby is dressing this with what sounds like Coban. I looked through [facility] health link. He was in the hospital about 3 weeks ago for an infection of the right elbow. Prior to that he had been in an urgent care receiving Keflex. I do not think he was admitted  to hospital but may have spent some time in the ER. I do not see any cultures. 3/11; patient presents for follow-up. He has been using Hydrofera Blue to the right elbow. The wound has healed. Readmission: 09-01-2022 upon evaluation today patient presents for reevaluation here in the clinic he actually was previously seen for his elbow today he comes in with the elbow okay but unfortunately a right gluteal abscess. Unfortunately he tells me that this is somewhat painful. He was seen in the ER they gave him doxycycline he has not picked that up yet that was just yesterday. WASSIM, KIRKSEY ([id number]) [id number] Page 2 of 8 09-08-2022 upon evaluation today patient's wound actually appears to be excellent. In fact he called in stating that was bleeding profusely have a note in communications concerning that. Nonetheless the home health nurse and reassured me he was doing okay and this appears to be doing just fine today as well. I think it is actually showing less pressure compared to last time I saw him as far as the overall surrounding tissue and how things appear. 09-15-2022 upon evaluation today patient's wound unfortunately appears to be infected. I do believe that he is going to require intervention as far as antibiotics are concerned I am actually going to grab a wound culture today as well. 09-22-2022 upon evaluation today patient appears to be doing better in regard to his wound and actually very pleased with where things stand. Fortunately I do think that he is moving in the right direction. No fevers, chills, nausea, vomiting, or diarrhea. 09-30-2022 upon evaluation today patient appears to be doing well currently in regard to his wound. This is actually showing signs of improvement which is great news. I am very pleased with where we stand. I do not see any evidence of active infection  locally nor systemically which is great news. The patient in general tells me that  he is still having some discomfort but this is not nearly as much as it was in the beginning. Overall I do believe that we are moving in the right direction. 10-07-2022 upon evaluation today patient appears to be doing well currently in regard to his wound which is showing signs of improvement. Fortunately I do not see any evidence of active infection locally nor systemically which is great news. No fevers, chills, nausea, vomiting, or diarrhea. 10-17-2022 upon evaluation today patient appears to be doing well currently in regard to his wound though he continues to have fecal material in and around the dressing and wound. This is not good for the healing and may in fact be slowing things down quite a bit. 10-25-2022 upon evaluation today patient's wound actually appears to be making progress which is good news. Fortunately I do not see any signs of infection and the wound is measuring smaller. I do not feel there is any evidence of active infection locally nor systemically at this point. 11-01-2022 upon evaluation today patient appears to be doing well currently in regard to his wound which is actually showing signs of improvement. Fortunately there does not appear to be any evidence of active infection locally nor systemically which is great news. No fevers, chills, nausea, vomiting, or diarrhea. 11-10-2022 upon evaluation today patient appears to be doing well currently in regard to his wound in the gluteal region which is making progress here. Fortunately I do not see any signs of infection he is measuring smaller and again I felt removing in the right way here. In regard to his finger this is a new injury that he cannot even tell me how he injured this. This is of concern for sure but again I am not exactly sure the etiology behind what happened he feels he may have "hit it on something" nonetheless I really do not know although it does not look bruised. We have gotten a note from the home health  nurse stating that he had shut it in a door. Nonetheless the patient actually denies this. I really do not know what happened here. 11-17-2022 upon evaluation today patient appears to be doing well currently in regard to his wounds. Fortunately there does not appear to be any signs of active infection at this time. The finger is looking better and the wound in the gluteal region is also doing better. In general I think that we will make an some good progress here. 11-24-2022 upon evaluation today patient appears to be doing well with regard to the wound in the gluteal region. This is on the right. With that being said he does require some debridement regards to the finger there is a little bit of necrotic tissue with an x-ray of that away. 12-01-2022 upon evaluation patient appears to be doing well currently in regard to his wounds. He is actually showing signs of excellent improvement which is great news and in general I do think that we are moving in the right direction here. I do not see any signs of active infection locally nor systemically which is great news. Unfortunately the T egaderm did not stay in place he is going back to the bordered gauze dressing. 12-08-2022 upon evaluation today patient actually appears to be doing excellent in fact he is showing signs of great improvement and very pleased with where we stand I do believe that we are moving in  the right direction here. No fevers, chills, nausea, vomiting, or diarrhea. 12-16-2022 upon evaluation today patient appears to be doing well currently in regard to his wound. He has been tolerating the dressing changes without complication and the good news is he seems to be doing excellent at this point. I do not see any evidence of active infection locally nor systemically which is great news. Unfortunately in regard to his finger which has healed he actually does have evidence of an infection here this is swollen and erythematous it is not open  there are no areas of drainage but at the same time I do think he is going require some treatment here. 12-23-2022 upon evaluation today patient appears to be doing excellent in regard to his wounds. In fact I do believe that the wound in the gluteal area is probably healed although we will get a monitor 1 more week before complete closure I think though he will probably be ready for discharge at that time. Fortunately I do not see any signs of active infection locally or systemically at this time which is great news. Electronic Signature(s) Signed: 12/23/2022 2:00:22 PM By: Allen Derry PA-C Entered By: Allen Derry on 12/23/2022 14:00:22 -------------------------------------------------------------------------------- Physical Exam Details Patient Name: Date of Service: Larry Callahan. 12/23/2022 9:00 A M Medical Record Number: [id number] Patient Account Number: [id number] Date of Birth/Sex: Treating RN: 1956/03/02 (67 y.o. [name]) Yevonne Pax Primary Care Provider: Sallyanne Kuster Other Clinician: Referring Provider: Treating Provider/Extender: Weldon Inches Weeks in Treatment: [address] [place] Callahan ([id number]) [id number] Page 3 of 8 Constitutional Well-nourished and well-hydrated in no acute distress. Respiratory normal breathing without difficulty. Psychiatric this patient is able to make decisions and demonstrates good insight into disease process. Alert and Oriented x 3. pleasant and cooperative. Notes Upon inspection patient's wound bed actually showed signs of good granulation epithelization at this point. Fortunately I do not see any evidence of worsening overall and I do believe that the patient is making good headway towards closure and very pleased in that regard. Electronic Signature(s) Signed: 12/23/2022 2:00:35 PM By: Allen Derry PA-C Entered By: Allen Derry on 12/23/2022  14:00:35 -------------------------------------------------------------------------------- Physician Orders Details Patient Name: Date of Service: Larry Callahan. 12/23/2022 9:00 A M Medical Record Number: [id number] Patient Account Number: [id number] Date of Birth/Sex: Treating RN: 11/25/1955 (67 y.o. [name]) Yevonne Pax Primary Care Provider: Sallyanne Kuster Other Clinician: Referring Provider: Treating Provider/Extender: Weldon Inches Weeks in Treatment: 40 Verbal / Phone Orders: No Diagnosis Coding Follow-up Appointments Return Appointment in 1 week. Home Health [facility] for wound care. May utilize formulary equivalent dressing for wound treatment orders unless otherwise specified. Home Health Nurse may visit PRN to address patients wound care needs. Rolene Arbour Fax: [phone] Anesthetic (Use 'Patient Medications' Section for Anesthetic Order Entry) Lidocaine applied to wound bed Edema Control - Lymphedema / Segmental Compressive Device / Other Elevate, Exercise Daily and A void Standing for Long Periods of Time. Elevate legs to the level of the heart and pump ankles as often as possible Elevate leg(s) parallel to the floor when sitting. Wound Treatment Wound #2 - Gluteus Wound Laterality: Right Cleanser: Soap and Water 3 x Per Week/15 Days Discharge Instructions: Gently cleanse wound with antibacterial soap, rinse and pat dry prior to dressing wounds Prim Dressing: Hydrofera Blue Ready Transfer Foam, 2.5x2.5 (in/in) (Generic) 3 x Per Week/15 Days ary Discharge Instructions: Apply Hydrofera Blue Ready to wound bed as directed Secondary Dressing: T  Adhesive [employer], 4x4 (in/in) 3 x Per Week/15 Days elfa Discharge Instructions: Apply over dressing to secure in place. Electronic Signature(s) Signed: 12/23/2022 9:28:43 AM By: Yevonne Pax RN Signed: 12/23/2022 2:27:58 PM By: Allen Derry PA-C Entered By: Yevonne Pax on 12/23/2022  09:28:43 Larry Callahan ([id number]) [id number] Page 4 of 8 Prescription 12/23/2022 -------------------------------------------------------------------------------- Barnie Mort PA-C Patient Name: Provider: 02/20/1956 [id number] Date of Birth: NPI#: [name] [id number] Sex: DEA #: [id number] [id number] Phone #: License #: UPN: Patient Address: [address] [facility] [place], [place] [address] [facility] [address] [place], [place] [address] [phone] Allergies penicillin Provider's Orders CONTINUE Home Health for wound care. May utilize formulary equivalent dressing for wound treatment orders unless otherwise specified. Home Health Nurse may visit PRN to address patients wound care needs. Rolene Arbour Fax: [phone] Hand Signature: Date(s): Electronic Signature(s) Signed: 12/23/2022 2:27:58 PM By: Allen Derry PA-C Signed: 12/29/2022 3:42:23 PM By: Yevonne Pax RN Entered By: Yevonne Pax on 12/23/2022 09:28:43 -------------------------------------------------------------------------------- Problem List Details Patient Name: Date of Service: Larry Callahan. 12/23/2022 9:00 A M Medical Record Number: [id number] Patient Account Number: [id number] Date of Birth/Sex: Treating RN: [birth year] (67 y.o. Larry Callahan Primary Care Provider: Sallyanne Kuster Other Clinician: Referring Provider: Treating Provider/Extender: Weldon Inches Weeks in Treatment: 18 Active Problems ICD-10 Encounter Code Description Active Date MDM Diagnosis L02.31 Cutaneous abscess of buttock 08/15/2022 No Yes Larry Callahan, Larry Callahan ([id number]) [id number] Page 5 of 8 [phone] Non-pressure chronic ulcer of skin of other sites with fat layer exposed 11/10/2022 No Yes S61.211A Laceration without foreign body of left index finger without damage to nail,  11/10/2022 No Yes initial encounter Inactive Problems Resolved Problems Electronic Signature(s) Signed: 12/23/2022 9:34:45 AM By: Allen Derry PA-C Entered By: Allen Derry on 12/23/2022 09:34:45 -------------------------------------------------------------------------------- Progress Note Details Patient Name: Date of Service: Larry Callahan. 12/23/2022 9:00 A M Medical Record Number: [id number] Patient Account Number: [id number] Date of Birth/Sex: Treating RN: [birth year] (67 y.o. [name]) Yevonne Pax Primary Care Provider: Sallyanne Kuster Other Clinician: Referring Provider: Treating Provider/Extender: Weldon Inches Weeks in Treatment: 18 Subjective Chief Complaint Information obtained from Patient Right gluteal abscess and Left 2nd finger ulcer History of Present Illness (HPI) ADMISSION 08/15/2022 This is a 67 year old man who came in with very little medical information. There was some suggestion that he was in the hospital last month with cellulitis of the arm I will need to check this out thoroughly. He tells me that he received 2 courses of antibiotics last month were not really sure which ones they were but they are completed now. He is not really having any discomfort. His sister who lives nearby is dressing this with what sounds like Coban. I looked through [facility] health link. He was in the hospital about 3 weeks ago for an infection of the right elbow. Prior to that he had been in an urgent care receiving Keflex. I do not think he was admitted to hospital but may have spent some time in the ER. I do not see any cultures. 3/11; patient presents for follow-up. He has been using Hydrofera Blue to the right elbow. The wound has healed. Readmission: 09-01-2022 upon evaluation today patient presents for reevaluation here in the clinic he actually was previously seen for his elbow today he comes in with the elbow okay but unfortunately a right gluteal abscess. Unfortunately  he tells me that  this is somewhat painful. He was seen in the ER they gave him doxycycline he has not picked that up yet that was just yesterday. 09-08-2022 upon evaluation today patient's wound actually appears to be excellent. In fact he called in stating that was bleeding profusely have a note in communications concerning that. Nonetheless the home health nurse and reassured me he was doing okay and this appears to be doing just fine today as well. I think it is actually showing less pressure compared to last time I saw him as far as the overall surrounding tissue and how things appear. 09-15-2022 upon evaluation today patient's wound unfortunately appears to be infected. I do believe that he is going to require intervention as far as antibiotics are concerned I am actually going to grab a wound culture today as well. 09-22-2022 upon evaluation today patient appears to be doing better in regard to his wound and actually very pleased with where things stand. Fortunately I do think that he is moving in the right direction. No fevers, chills, nausea, vomiting, or diarrhea. 09-30-2022 upon evaluation today patient appears to be doing well currently in regard to his wound. This is actually showing signs of improvement which is great news. I am very pleased with where we stand. I do not see any evidence of active infection locally nor systemically which is great news. The patient in general tells me that he is still having some discomfort but this is not nearly as much as it was in the beginning. Overall I do believe that we are moving in the right Larry Callahan, Larry Callahan ([id number]) [id number] Page 6 of 8 direction. 10-07-2022 upon evaluation today patient appears to be doing well currently in regard to his wound which is showing signs of improvement. Fortunately I do not see any evidence of active infection locally nor systemically which is great news. No fevers, chills, nausea,  vomiting, or diarrhea. 10-17-2022 upon evaluation today patient appears to be doing well currently in regard to his wound though he continues to have fecal material in and around the dressing and wound. This is not good for the healing and may in fact be slowing things down quite a bit. 10-25-2022 upon evaluation today patient's wound actually appears to be making progress which is good news. Fortunately I do not see any signs of infection and the wound is measuring smaller. I do not feel there is any evidence of active infection locally nor systemically at this point. 11-01-2022 upon evaluation today patient appears to be doing well currently in regard to his wound which is actually showing signs of improvement. Fortunately there does not appear to be any evidence of active infection locally nor systemically which is great news. No fevers, chills, nausea, vomiting, or diarrhea. 11-10-2022 upon evaluation today patient appears to be doing well currently in regard to his wound in the gluteal region which is making progress here. Fortunately I do not see any signs of infection he is measuring smaller and again I felt removing in the right way here. In regard to his finger this is a new injury that he cannot even tell me how he injured this. This is of concern for sure but again I am not exactly sure the etiology behind what happened he feels he may have "hit it on something" nonetheless I really do not know although it does not look bruised. We have gotten a note from the home health nurse stating that he had shut it in a door. Nonetheless  the patient actually denies this. I really do not know what happened here. 11-17-2022 upon evaluation today patient appears to be doing well currently in regard to his wounds. Fortunately there does not appear to be any signs of active infection at this time. The finger is looking better and the wound in the gluteal region is also doing better. In general I think that we will  make an some good progress here. 11-24-2022 upon evaluation today patient appears to be doing well with regard to the wound in the gluteal region. This is on the right. With that being said he does require some debridement regards to the finger there is a little bit of necrotic tissue with an x-ray of that away. 12-01-2022 upon evaluation patient appears to be doing well currently in regard to his wounds. He is actually showing signs of excellent improvement which is great news and in general I do think that we are moving in the right direction here. I do not see any signs of active infection locally nor systemically which is great news. Unfortunately the T egaderm did not stay in place he is going back to the bordered gauze dressing. 12-08-2022 upon evaluation today patient actually appears to be doing excellent in fact he is showing signs of great improvement and very pleased with where we stand I do believe that we are moving in the right direction here. No fevers, chills, nausea, vomiting, or diarrhea. 12-16-2022 upon evaluation today patient appears to be doing well currently in regard to his wound. He has been tolerating the dressing changes without complication and the good news is he seems to be doing excellent at this point. I do not see any evidence of active infection locally nor systemically which is great news. Unfortunately in regard to his finger which has healed he actually does have evidence of an infection here this is swollen and erythematous it is not open there are no areas of drainage but at the same time I do think he is going require some treatment here. 12-23-2022 upon evaluation today patient appears to be doing excellent in regard to his wounds. In fact I do believe that the wound in the gluteal area is probably healed although we will get a monitor 1 more week before complete closure I think though he will probably be ready for discharge at that time. Fortunately I do not see any  signs of active infection locally or systemically at this time which is great news. Objective Constitutional Well-nourished and well-hydrated in no acute distress. Vitals Time Taken: 9:17 AM, Height: 71 in, Weight: 229 lbs, BMI: 31.9, Temperature: 98.2 Callahan, Pulse: 66 bpm, Respiratory Rate: 18 breaths/min, Blood Pressure: 118/74 mmHg. Respiratory normal breathing without difficulty. Psychiatric this patient is able to make decisions and demonstrates good insight into disease process. Alert and Oriented x 3. pleasant and cooperative. General Notes: Upon inspection patient's wound bed actually showed signs of good granulation epithelization at this point. Fortunately I do not see any evidence of worsening overall and I do believe that the patient is making good headway towards closure and very pleased in that regard. Integumentary (Hair, Skin) Wound #2 status is Open. Original cause of wound was Gradually Appeared. The date acquired was: 08/29/2022. The wound has been in treatment 16 weeks. The wound is located on the Right Gluteus. The wound measures 0.1cm length x 0.1cm width x 0.1cm depth; 0.008cm^2 area and 0.001cm^3 volume. There is Fat Layer (Subcutaneous Tissue) exposed. There is no tunneling or  undermining noted. There is a medium amount of serosanguineous drainage noted. There is large (67-100%) red granulation within the wound bed. There is no necrotic tissue within the wound bed. Assessment Active Problems ICD-10 Cutaneous abscess of buttock Non-pressure chronic ulcer of skin of other sites with fat layer exposed Laceration without foreign body of left index finger without damage to nail, initial encounter Larry Callahan, Larry Callahan ([id number]) [id number] Page 7 of 8 Plan Follow-up Appointments: Return Appointment in 1 week. [facility] Health: [facility] for wound care. May utilize formulary equivalent dressing for wound treatment orders unless otherwise  specified. Home Health Nurse may visit PRN to address patients wound care needs. Rolene Arbour Fax: [phone] Anesthetic (Use 'Patient Medications' Section for Anesthetic Order Entry): Lidocaine applied to wound bed Edema Control - Lymphedema / Segmental Compressive Device / Other: Elevate, Exercise Daily and Avoid Standing for Long Periods of Time. Elevate legs to the level of the heart and pump ankles as often as possible Elevate leg(s) parallel to the floor when sitting. WOUND #2: - Gluteus Wound Laterality: Right Cleanser: Soap and Water 3 x Per Week/15 Days Discharge Instructions: Gently cleanse wound with antibacterial soap, rinse and pat dry prior to dressing wounds Prim Dressing: Hydrofera Blue Ready Transfer Foam, 2.5x2.5 (in/in) (Generic) 3 x Per Week/15 Days ary Discharge Instructions: Apply Hydrofera Blue Ready to wound bed as directed Secondary Dressing: T Adhesive [employer], 4x4 (in/in) 3 x Per Week/15 Days elfa Discharge Instructions: Apply over dressing to secure in place. 1. I would recommend that the patient should continue to finish off his antibiotics his finger looks great I think the cellulitis is improving. 2. I am also going to recommend that the patient should continue with the [facility] Blue to the gluteal region. I think this is Probably healed but I am not 100% sure right in the middle we will get a monitor this 1 more week. We will see patient back for reevaluation in 1 week here in the clinic. If anything worsens or changes patient will contact our office for additional recommendations. Electronic Signature(s) Signed: 12/23/2022 2:01:42 PM By: Allen Derry PA-C Entered By: Allen Derry on 12/23/2022 14:01:42 -------------------------------------------------------------------------------- SuperBill Details Patient Name: Date of Service: Larry Callahan, Larry Callahan. 12/23/2022 Medical Record Number: [id number] Patient Account Number: [id number] Date of Birth/Sex:  Treating RN: 1955/08/10 (67 y.o. [name]) Yevonne Pax Primary Care Provider: Sallyanne Kuster Other Clinician: Referring Provider: Treating Provider/Extender: Weldon Inches Weeks in Treatment: 18 Diagnosis Coding ICD-10 Codes Code Description L02.31 Cutaneous abscess of buttock L98.492 Non-pressure chronic ulcer of skin of other sites with fat layer exposed S61.211A Laceration without foreign body of left index finger without damage to nail, initial encounter Facility Procedures : CPT4 Code: 95284132 Description: 44010 - WOUND CARE VISIT-LEV 2 EST PT Modifier: Quantity: 1 Physician Procedures : CPT4 Code Description Modifier [id number] - WC PHYS LEVEL 3 - EST PT Larry Callahan, Larry Callahan ([id number]) [id number] ICD-10 Diagnosis Description L02.31 Cutaneous abscess of buttock L98.492 Non-pressure chronic ulcer of skin of  other sites with fat layer exposed S61.211A Laceration without foreign body of left index finger without damage to nail, initial encounter Quantity: 1 Page 8 of 8 Electronic Signature(s) Signed: 12/23/2022 2:02:16 PM By: Allen Derry PA-C Previous Signature: 12/23/2022 9:29:25 AM Version By: Yevonne Pax RN Entered By: Allen Derry on 12/23/2022 14:02:16

## 2022-12-29 NOTE — Telephone Encounter (Signed)
Notified Verlon Au and Lurena Joiner w/ [facility] of order-[name]

## 2022-12-30 ENCOUNTER — Encounter: Payer: Medicare PPO | Admitting: Physician Assistant

## 2022-12-30 DIAGNOSIS — E119 Type 2 diabetes mellitus without complications: Secondary | ICD-10-CM | POA: Diagnosis not present

## 2022-12-30 DIAGNOSIS — S61211A Laceration without foreign body of left index finger without damage to nail, initial encounter: Secondary | ICD-10-CM | POA: Diagnosis not present

## 2022-12-30 DIAGNOSIS — I10 Essential (primary) hypertension: Secondary | ICD-10-CM | POA: Diagnosis not present

## 2022-12-30 DIAGNOSIS — L98492 Non-pressure chronic ulcer of skin of other sites with fat layer exposed: Secondary | ICD-10-CM | POA: Diagnosis not present

## 2022-12-30 DIAGNOSIS — I499 Cardiac arrhythmia, unspecified: Secondary | ICD-10-CM | POA: Diagnosis not present

## 2022-12-30 DIAGNOSIS — L0231 Cutaneous abscess of buttock: Secondary | ICD-10-CM | POA: Diagnosis not present

## 2022-12-30 DIAGNOSIS — I2510 Atherosclerotic heart disease of native coronary artery without angina pectoris: Secondary | ICD-10-CM | POA: Diagnosis not present

## 2023-01-02 ENCOUNTER — Other Ambulatory Visit: Payer: Self-pay | Admitting: Physician Assistant

## 2023-01-02 DIAGNOSIS — G4733 Obstructive sleep apnea (adult) (pediatric): Secondary | ICD-10-CM | POA: Diagnosis not present

## 2023-01-02 DIAGNOSIS — E785 Hyperlipidemia, unspecified: Secondary | ICD-10-CM | POA: Diagnosis not present

## 2023-01-02 DIAGNOSIS — I110 Hypertensive heart disease with heart failure: Secondary | ICD-10-CM | POA: Diagnosis not present

## 2023-01-02 DIAGNOSIS — I509 Heart failure, unspecified: Secondary | ICD-10-CM | POA: Diagnosis not present

## 2023-01-02 DIAGNOSIS — Z602 Problems related to living alone: Secondary | ICD-10-CM | POA: Diagnosis not present

## 2023-01-02 DIAGNOSIS — L0231 Cutaneous abscess of buttock: Secondary | ICD-10-CM | POA: Diagnosis not present

## 2023-01-02 DIAGNOSIS — J449 Chronic obstructive pulmonary disease, unspecified: Secondary | ICD-10-CM | POA: Diagnosis not present

## 2023-01-02 DIAGNOSIS — E119 Type 2 diabetes mellitus without complications: Secondary | ICD-10-CM | POA: Diagnosis not present

## 2023-01-02 DIAGNOSIS — I2510 Atherosclerotic heart disease of native coronary artery without angina pectoris: Secondary | ICD-10-CM | POA: Diagnosis not present

## 2023-01-02 NOTE — Progress Notes (Signed)
Callahan, Larry ([id number]) [id number] Page 1 of 8 Visit Report for 12/30/2022 Arrival Information Details Patient Name: Date of Service: Larry Callahan MES F. 12/30/2022 11:15 A M Medical Record Number: [id number] Patient Account Number: [id number] Date of Birth/Sex: Treating Callahan: [birth year] (67 y.o. Larry Callahan Primary Care Larry Callahan: Larry Callahan Other Clinician: [name] Referring Larry Callahan: Treating Rein Larry Callahan/Extender: Larry Callahan Weeks in Treatment: 19 Visit Information History Since Last Visit Has Dressing in Place as Prescribed: Yes Patient Arrived: Ambulatory Pain Present Now: No Arrival Time: 11:15 Transfer Assistance: None Patient Identification Verified: Yes Secondary Verification Process Completed: Yes Patient Requires Transmission-Based Precautions: No Patient Has Alerts: Yes Patient Alerts: Patient on Blood Thinner Electronic Signature(s) Signed: 01/02/2023 10:49:46 AM By: Larry Callahan, BSN, Callahan, CWS, Larry Callahan, BSN Entered By: Larry Callahan, BSN, Callahan, CWS, Larry on 01/02/2023 10:49:46 -------------------------------------------------------------------------------- Clinic Level of Care Assessment Details Patient Name: Date of Service: Larry Callahan MES F. 12/30/2022 11:15 A M Medical Record Number: [id number] Patient Account Number: [id number] Date of Birth/Sex: Treating Callahan: 19-Apr-1956 (67 y.o. Larry Callahan Primary Care Larry Callahan: Larry Callahan Other Clinician: [name] Referring Skylynne Larry Callahan: Treating Larry Callahan/Extender: Larry Callahan Weeks in Treatment: 19 Clinic Level of Care Assessment Items TOOL 4 Quantity Score []  - 0 Use when only an EandM is performed on FOLLOW-UP visit ASSESSMENTS - Nursing Assessment / Reassessment X- 1 10 Reassessment of Co-morbidities (includes updates in patient status) X- 1 5 Reassessment of Adherence to Treatment Plan ASSESSMENTS - Wound and Skin A ssessment / Reassessment X -  Simple Wound Assessment / Reassessment - one wound 1 5 []  - 0 Complex Wound Assessment / Reassessment - multiple wounds Larry Callahan ([id number]) [id number] Page 2 of 8 []  - 0 Dermatologic / Skin Assessment (not related to wound area) ASSESSMENTS - Focused Assessment []  - 0 Circumferential Edema Measurements - multi extremities []  - 0 Nutritional Assessment / Counseling / Intervention []  - 0 Lower Extremity Assessment (monofilament, tuning fork, pulses) []  - 0 Peripheral Arterial Disease Assessment (using hand held doppler) ASSESSMENTS - Ostomy and/or Continence Assessment and Care []  - 0 Incontinence Assessment and Management []  - 0 Ostomy Care Assessment and Management (repouching, etc.) PROCESS - Coordination of Care X - Simple Patient / Family Education for ongoing care 1 15 []  - 0 Complex (extensive) Patient / Family Education for ongoing care X- 1 10 Staff obtains Consents, Records, T Results / Process Orders est []  - 0 Staff telephones HHA, Nursing Homes / Clarify orders / etc []  - 0 Routine Transfer to another Facility (non-emergent condition) []  - 0 Routine Hospital Admission (non-emergent condition) []  - 0 New Admissions / Manufacturing engineer / Ordering NPWT Apligraf, etc. , []  - 0 Emergency Hospital Admission (emergent condition) X- 1 10 Simple Discharge Coordination []  - 0 Complex (extensive) Discharge Coordination PROCESS - Special Needs []  - 0 Pediatric / Minor Patient Management []  - 0 Isolation Patient Management []  - 0 Hearing / Language / Visual special needs []  - 0 Assessment of Community assistance (transportation, D/C planning, etc.) []  - 0 Additional assistance / Altered mentation []  - 0 Support Surface(s) Assessment (bed, cushion, seat, etc.) INTERVENTIONS - Wound Cleansing / Measurement X - Simple Wound Cleansing - one wound 1 5 []  - 0 Complex Wound Cleansing - multiple wounds X- 1 5 Wound Imaging  (photographs - any number of wounds) []  - 0 Wound Tracing (instead of photographs) X- 1 5 Simple Wound Measurement - one wound []  - 0 Complex Wound  Measurement - multiple wounds INTERVENTIONS - Wound Dressings []  - 0 Small Wound Dressing one or multiple wounds []  - 0 Medium Wound Dressing one or multiple wounds []  - 0 Large Wound Dressing one or multiple wounds []  - 0 Application of Medications - topical []  - 0 Application of Medications - injection INTERVENTIONS - Miscellaneous []  - 0 External ear exam []  - 0 Specimen Collection (cultures, biopsies, blood, body fluids, etc.) []  - 0 Specimen(s) / Culture(s) sent or taken to Lab for analysis Larry Callahan ([id number]) [id number] Page 3 of 8 []  - 0 Patient Transfer (multiple staff / Nurse, adult / Similar devices) []  - 0 Simple Staple / Suture removal (25 or less) []  - 0 Complex Staple / Suture removal (26 or more) []  - 0 Hypo / Hyperglycemic Management (close monitor of Blood Glucose) []  - 0 Ankle / Brachial Index (ABI) - do not check if billed separately X- 1 5 Vital Signs Has the patient been seen at the hospital within the last three years: Yes Total Score: 75 Level Of Care: New/Established - Level 2 Electronic Signature(s) Unsigned Entered By: Larry Callahan, BSN, Callahan, CWS, Larry on 01/02/2023 10:52:40 -------------------------------------------------------------------------------- Encounter Discharge Information Details Patient Name: Date of Service: Larry Callahan MES F. 12/30/2022 11:15 A M Medical Record Number: [id number] Patient Account Number: [id number] Date of Birth/Sex: Treating Callahan: 12/27/1955 (67 y.o. Larry Callahan Primary Care Larry Callahan: Larry Callahan Other Clinician: [name] Referring Shacoya Burkhammer: Treating Larry Callahan/Extender: Larry Callahan Weeks in Treatment: 19 Encounter Discharge Information Items Discharge Condition: Stable Ambulatory Status:  Ambulatory Discharge Destination: Home Transportation: Ambulance Accompanied By: self Schedule Follow-up Appointment: Yes Clinical Summary of Care: Electronic Signature(s) Signed: 01/02/2023 10:55:25 AM By: Larry Callahan, BSN, Callahan, CWS, Larry Callahan, BSN Entered By: Larry Callahan, BSN, Callahan, CWS, Larry on 01/02/2023 10:55:25 -------------------------------------------------------------------------------- General Visit Notes Details Patient Name: Date of Service: Larry Callahan MES F. 12/30/2022 11:15 A M Medical Record Number: [id number] Patient Account Number: [id number] Date of Birth/Sex: Treating Callahan: [birth year] (67 y.o. Larry Callahan Pena, Cary F ([id number]) [id number] Page 4 of 8 Primary Care Trevon Strothers: Larry Callahan Other Clinician: [name] Referring Kalayna Noy: Treating Zlatan Hornback/Extender: Larry Callahan Weeks in Treatment: 17 Notes Late data entry due to national computer outage on Friday 12/30/2022. Electronic Signature(s) Signed: 01/02/2023 10:55:47 AM By: Larry Callahan, BSN, Callahan, CWS, Larry Callahan, BSN Entered By: Larry Callahan, BSN, Callahan, CWS, Larry on 01/02/2023 10:55:47 -------------------------------------------------------------------------------- Lower Extremity Assessment Details Patient Name: Date of Service: Larry Callahan MES F. 12/30/2022 11:15 A M Medical Record Number: [id number] Patient Account Number: [id number] Date of Birth/Sex: Treating Callahan: 08-27-1955 (67 y.o. Larry Callahan Primary Care Roen Macgowan: Larry Callahan Other Clinician: [name] Referring Jarin Cornfield: Treating Aquila Menzie/Extender: Larry Callahan Weeks in Treatment: 77 Electronic Signature(s) Signed: 01/02/2023 10:51:20 AM By: Larry Callahan, BSN, Callahan, CWS, Larry Callahan, BSN Entered By: Larry Callahan, BSN, Callahan, CWS, Larry on 01/02/2023 10:51:20 -------------------------------------------------------------------------------- Multi Wound Chart Details Patient Name: Date of Service: Larry Callahan MES F.  12/30/2022 11:15 A M Medical Record Number: [id number] Patient Account Number: [id number] Date of Birth/Sex: Treating Callahan: Nov 13, 1955 (67 y.o. Larry Callahan Primary Care Shilynn Hoch: Larry Callahan Other Clinician: [name] Referring Bennett Ram: Treating Criston Chancellor/Extender: Larry Callahan Weeks in Treatment: 19 Vital Signs Height(in): 71 Pulse(bpm): 66 Weight(lbs): 229 Blood Pressure(mmHg): 118/74 Body Mass Index(BMI): 31.9 Temperature(F): 98.2 Respiratory Rate(breaths/min): 18 [2:Photos: No Photos Right Gluteus Wound Location: Gradually Appeared Wounding Event:] [N/A:N/A N/A N/A] JSON, KOELZER ([id number]) [2:Abscess Primary Etiology: 08/29/2022 Date  Acquired: 17 Weeks of Treatment: Healed - Epithelialized Wound Status: No Wound Recurrence: 0x0x0 Measurements L x W x D (cm) 0 A (cm) : rea 0 Volume (cm) : 100.00% % Reduction in A rea: 100.00% % Reduction in  Volume: Full Thickness Without Exposed Classification: Support Structures Medium Exudate Amount: Serosanguineous Exudate Type: red, brown Exudate Color:] [N/A:N/A N/A N/A N/A N/A N/A N/A N/A N/A N/A N/A N/A N/A N/A] Treatment Notes Electronic Signature(s) Signed: 01/02/2023 10:51:29 AM By: Larry Callahan, BSN, Callahan, CWS, Larry Callahan, BSN Entered By: Larry Callahan, BSN, Callahan, CWS, Larry on 01/02/2023 10:51:29 -------------------------------------------------------------------------------- Multi-Disciplinary Care Plan Details Patient Name: Date of Service: Larry Callahan MES F. 12/30/2022 11:15 A M Medical Record Number: [id number] Patient Account Number: [id number] Date of Birth/Sex: Treating Callahan: 02-Dec-1955 (67 y.o. Larry Callahan Primary Care Sampson Self: Larry Callahan Other Clinician: [name] Referring Kade Rickels: Treating Wah Sabic/Extender: Larry Callahan Weeks in Treatment: 61 Active Inactive Electronic Signature(s) Signed: 01/02/2023 10:52:59 AM By: Larry Callahan, BSN, Callahan, CWS, Larry Callahan, BSN Entered By: Larry Callahan, BSN, Callahan, CWS,  Larry on 01/02/2023 10:52:59 -------------------------------------------------------------------------------- Pain Assessment Details Patient Name: Date of Service: Larry Callahan MES F. 12/30/2022 11:15 A M Medical Record Number: [id number] Patient Account Number: [id number] Date of Birth/Sex: Treating Callahan: Feb 22, 1956 (67 y.o. Larry Callahan Primary Care Rommie Dunn: Larry Callahan Other Clinician: [name] Referring Zaide Kardell: Treating Catlyn Shipton/Extender: Larry Callahan Weeks in Treatment: [address], [place] F ([id number]) [id number] Page 6 of 8 Active Problems Location of Pain Severity and Description of Pain Patient Has Paino No Site Locations Pain Management and Medication Current Pain Management: Electronic Signature(s) Signed: 01/02/2023 10:50:50 AM By: Larry Callahan, BSN, Callahan, CWS, Larry Callahan, BSN Entered By: Larry Callahan, BSN, Callahan, CWS, Larry on 01/02/2023 10:50:49 -------------------------------------------------------------------------------- Patient/Caregiver Education Details Patient Name: Date of Service: Larry Callahan MES F. 7/19/2024andnbsp11:15 A M Medical Record Number: [id number] Patient Account Number: [id number] Date of Birth/Gender: Treating Callahan: 04/14/1956 (67 y.o. Larry Callahan Primary Care Physician: Larry Callahan Other Clinician: [name] Referring Physician: Treating Physician/Extender: Larry Callahan Weeks in Treatment: 57 Education Assessment Education Provided To: Patient Education Topics Provided Wound/Skin Impairment: Handouts: Other: dc treatment complete Electronic Signature(s) Unsigned Entered By: Larry Callahan, BSN, Callahan, CWS, Larry on 01/02/2023 10:54:24 Signature(s): LABIB, CWYNAR ([id number]) [id number] Date(s): [id number] Page 7 of 8 -------------------------------------------------------------------------------- Wound Assessment Details Patient Name: Date of Service: Larry Callahan MES F.  12/30/2022 11:15 A M Medical Record Number: [id number] Patient Account Number: [id number] Date of Birth/Sex: Treating Callahan: Aug 27, 1955 (67 y.o. Loel Lofty, Selena Batten Primary Care Deavion Strider: Larry Callahan Other Clinician: [name] Referring Knute Mazzuca: Treating Meldon Hanzlik/Extender: Larry Callahan Weeks in Treatment: 19 Wound Status Wound Number: 2 Primary Etiology: Abscess Wound Location: Right Gluteus Wound Status: Healed - Epithelialized Wounding Event: Gradually Appeared Date Acquired: 08/29/2022 Weeks Of Treatment: 17 Clustered Wound: No Wound Measurements Length: (cm) Width: (cm) Depth: (cm) Area: (cm) Volume: (cm) 0 % Reduction in Area: 100% 0 % Reduction in Volume: 100% 0 0 0 Wound Description Classification: Full Thickness Without Exposed Support Exudate Amount: Medium Exudate Type: Serosanguineous Exudate Color: red, brown Structures Treatment Notes Wound #2 (Gluteus) Wound Laterality: Right Cleanser Peri-Wound Care Topical Primary Dressing Secondary Dressing Secured With Compression Wrap Compression Stockings Add-Ons Electronic Signature(s) Unsigned Entered By: Larry Callahan, BSN, Callahan, CWS, Larry on 01/02/2023 10:51:03 Signature(s): [place] ([id number]) [id number] Date(s): [id number] Page 8 of 8 -------------------------------------------------------------------------------- Vitals Details Patient Name: Date of Service: Larry Callahan MES F. 12/30/2022 11:15 A M Medical Record  Number: [id number] Patient Account Number: [id number] Date of Birth/Sex: Treating Callahan: 23-Sep-1955 (67 y.o. Larry Callahan Primary Care Murel Wigle: Larry Callahan Other Clinician: [name] Referring Mallie Giambra: Treating Atharv Barriere/Extender: Larry Callahan Weeks in Treatment: 19 Vital Signs Time Taken: 11:15 Temperature (F): 98.2 Height (in): 71 Pulse (bpm): 66 Weight (lbs): 229 Respiratory Rate (breaths/min): 18 Body Mass Index (BMI):  31.9 Blood Pressure (mmHg): 118/74 Reference Range: 80 - 120 mg / dl Electronic Signature(s) Signed: 01/02/2023 10:50:41 AM By: Larry Callahan, BSN, Callahan, CWS, Larry Callahan, BSN Entered By: Larry Callahan, BSN, Callahan, CWS, Larry on 01/02/2023 10:50:40

## 2023-01-03 ENCOUNTER — Telehealth: Payer: Self-pay | Admitting: Nurse Practitioner

## 2023-01-03 NOTE — Telephone Encounter (Signed)
Per [facility], they do not go to patient's home to either deliver supplies or to service machines. I am attempting to switch patient to [facility] per patient request. Patient is unsure of what cpap pressure he is on. Sent message to [facility] for pressure settings-[name]

## 2023-01-03 NOTE — Telephone Encounter (Signed)
Notified Beth & Maralyn Sago that I faxed order and psg. Patient wants to switch dme-[name]

## 2023-01-04 ENCOUNTER — Telehealth: Payer: Self-pay | Admitting: Nurse Practitioner

## 2023-01-04 NOTE — Telephone Encounter (Signed)
Patient called for update on cpap supplies. I explained to him, I just faxed order and notes to [facility] yesterday-[name]

## 2023-01-05 ENCOUNTER — Telehealth: Payer: Self-pay | Admitting: Nurse Practitioner

## 2023-01-05 NOTE — Progress Notes (Addendum)
GJON, LETARTE ([id number]) [id number] Page 1 of 7 Visit Report for 12/30/2022 Chief Complaint Document Details Patient Name: Date of Service: Larry Callahan MES F. 12/30/2022 11:15 A M Medical Record Number: [id number] Patient Account Number: [id number] Date of Birth/Sex: Treating RN: August 23, 1955 (67 y.o. [name] Primary Care Provider: Sallyanne Kuster Other Clinician: Referring Provider: Treating Provider/Extender: Weldon Inches Weeks in Treatment: 19 Information Obtained from: Patient Chief Complaint Right gluteal abscess and Left 2nd finger ulcer Electronic Signature(s) Signed: 01/09/2023 8:46:46 AM By: Allen Derry PA-C Entered By: Allen Derry on 01/09/2023 08:46:46 -------------------------------------------------------------------------------- HPI Details Patient Name: Date of Service: Larry Callahan MES F. 12/30/2022 11:15 A M Medical Record Number: [id number] Patient Account Number: [id number] Date of Birth/Sex: Treating RN: [birth year] (67 y.o. [name] Primary Care Provider: Sallyanne Kuster Other Clinician: Referring Provider: Treating Provider/Extender: Weldon Inches Weeks in Treatment: 19 History of Present Illness HPI Description: ADMISSION 08/15/2022 This is a 67 year old man who came in with very little medical information. There was some suggestion that he was in the hospital last month with cellulitis of the arm I will need to check this out thoroughly. He tells me that he received 2 courses of antibiotics last month were not really sure which ones they were but they are completed now. He is not really having any discomfort. His sister who lives nearby is dressing this with what sounds like Coban. I looked through [facility] health [facility]. He was in the hospital about 3 weeks ago for an infection of the right elbow. Prior to that he had been in an urgent care receiving Keflex. I do not think he was admitted to  hospital but may have spent some time in the ER. I do not see any cultures. 3/11; patient presents for follow-up. He has been using Hydrofera Blue to the right elbow. The wound has healed. Readmission: 09-01-2022 upon evaluation today patient presents for reevaluation here in the clinic he actually was previously seen for his elbow today he comes in with the elbow okay but unfortunately a right gluteal abscess. Unfortunately he tells me that this is somewhat painful. He was seen in the ER they gave him doxycycline he has not picked that up yet that was just yesterday. DEREC, MOZINGO ([id number]) [id number] Page 2 of 7 09-08-2022 upon evaluation today patient's wound actually appears to be excellent. In fact he called in stating that was bleeding profusely have a note in communications concerning that. Nonetheless the home health nurse and reassured me he was doing okay and this appears to be doing just fine today as well. I think it is actually showing less pressure compared to last time I saw him as far as the overall surrounding tissue and how things appear. 09-15-2022 upon evaluation today patient's wound unfortunately appears to be infected. I do believe that he is going to require intervention as far as antibiotics are concerned I am actually going to grab a wound culture today as well. 09-22-2022 upon evaluation today patient appears to be doing better in regard to his wound and actually very pleased with where things stand. Fortunately I do think that he is moving in the right direction. No fevers, chills, nausea, vomiting, or diarrhea. 09-30-2022 upon evaluation today patient appears to be doing well currently in regard to his wound. This is actually showing signs of improvement which is great news. I am very pleased with where we stand. I do not see any evidence of active infection  locally nor systemically which is great news. The patient in general tells me that he  is still having some discomfort but this is not nearly as much as it was in the beginning. Overall I do believe that we are moving in the right direction. 10-07-2022 upon evaluation today patient appears to be doing well currently in regard to his wound which is showing signs of improvement. Fortunately I do not see any evidence of active infection locally nor systemically which is great news. No fevers, chills, nausea, vomiting, or diarrhea. 10-17-2022 upon evaluation today patient appears to be doing well currently in regard to his wound though he continues to have fecal material in and around the dressing and wound. This is not good for the healing and may in fact be slowing things down quite a bit. 10-25-2022 upon evaluation today patient's wound actually appears to be making progress which is good news. Fortunately I do not see any signs of infection and the wound is measuring smaller. I do not feel there is any evidence of active infection locally nor systemically at this point. 11-01-2022 upon evaluation today patient appears to be doing well currently in regard to his wound which is actually showing signs of improvement. Fortunately there does not appear to be any evidence of active infection locally nor systemically which is great news. No fevers, chills, nausea, vomiting, or diarrhea. 11-10-2022 upon evaluation today patient appears to be doing well currently in regard to his wound in the gluteal region which is making progress here. Fortunately I do not see any signs of infection he is measuring smaller and again I felt removing in the right way here. In regard to his finger this is a new injury that he cannot even tell me how he injured this. This is of concern for sure but again I am not exactly sure the etiology behind what happened he feels he may have "hit it on something" nonetheless I really do not know although it does not look bruised. We have gotten a note from the home health  nurse stating that he had shut it in a door. Nonetheless the patient actually denies this. I really do not know what happened here. 11-17-2022 upon evaluation today patient appears to be doing well currently in regard to his wounds. Fortunately there does not appear to be any signs of active infection at this time. The finger is looking better and the wound in the gluteal region is also doing better. In general I think that we will make an some good progress here. 11-24-2022 upon evaluation today patient appears to be doing well with regard to the wound in the gluteal region. This is on the right. With that being said he does require some debridement regards to the finger there is a little bit of necrotic tissue with an x-ray of that away. 12-01-2022 upon evaluation patient appears to be doing well currently in regard to his wounds. He is actually showing signs of excellent improvement which is great news and in general I do think that we are moving in the right direction here. I do not see any signs of active infection locally nor systemically which is great news. Unfortunately the T egaderm did not stay in place he is going back to the bordered gauze dressing. 12-08-2022 upon evaluation today patient actually appears to be doing excellent in fact he is showing signs of great improvement and very pleased with where we stand I do believe that we are moving in  the right direction here. No fevers, chills, nausea, vomiting, or diarrhea. 12-16-2022 upon evaluation today patient appears to be doing well currently in regard to his wound. He has been tolerating the dressing changes without complication and the good news is he seems to be doing excellent at this point. I do not see any evidence of active infection locally nor systemically which is great news. Unfortunately in regard to his finger which has healed he actually does have evidence of an infection here this is swollen and erythematous it is not open  there are no areas of drainage but at the same time I do think he is going require some treatment here. 12-23-2022 upon evaluation today patient appears to be doing excellent in regard to his wounds. In fact I do believe that the wound in the gluteal area is probably healed although we will get a monitor 1 more week before complete closure I think though he will probably be ready for discharge at that time. Fortunately I do not see any signs of active infection locally or systemically at this time which is great news. 12-30-2022 this is a note that is being put in late secondary to the fact that computers were down across the world due to a [employer] outage. Therefore there may be some lack of detail compared to what would normally be present in the notes. With that being said the patient actually appears to be doing very well in fact he appears to be completely healed based on his wounds and what I am seeing at this point. I do not see anything that is open at any site I think he is ready for discharge. Electronic Signature(s) Signed: 01/09/2023 8:47:04 AM By: Allen Derry PA-C Entered By: Allen Derry on 01/09/2023 08:47:04 -------------------------------------------------------------------------------- Physical Exam Details Patient Name: Date of Service: Larry Callahan MES F. 12/30/2022 11:15 A M Medical Record Number: [id number] Patient Account Number: [id number] Date of Birth/Sex: Treating RN: Aug 23, 1955 (67 y.o. [name] Primary Care Provider: Sallyanne Kuster Other Clinician: MUTASIM, TUCKEY ([id number]) [id number] Page 3 of 7 Referring Provider: Treating Provider/Extender: Weldon Inches Weeks in Treatment: 25 Constitutional Well-nourished and well-hydrated in no acute distress. Respiratory normal breathing without difficulty. Psychiatric this patient is able to make decisions and demonstrates good insight into disease process. Alert and  Oriented x 3. pleasant and cooperative. Notes Upon inspection patient's wound bed actually showed signs of good granulation and epithelization at this point. With that being said I do not see anything open currently and I think that the patient is actually making excellent progress towards complete closure. Electronic Signature(s) Signed: 01/09/2023 8:47:25 AM By: Allen Derry PA-C Entered By: Allen Derry on 01/09/2023 08:47:25 -------------------------------------------------------------------------------- Physician Orders Details Patient Name: Date of Service: Larry Callahan MES F. 12/30/2022 11:15 A M Medical Record Number: [id number] Patient Account Number: [id number] Date of Birth/Sex: Treating RN: 1956-01-21 (67 y.o. [name] Primary Care Provider: Sallyanne Kuster Other Clinician: Betha Loa Referring Provider: Treating Provider/Extender: Weldon Inches Weeks in Treatment: 68 Verbal / Phone Orders: No Diagnosis Coding Discharge From [facility] Services Discharge from Wound Care Center Treatment Complete Home Health DISCONTINUE Home Health for Wound Care. - treatment complete Electronic Signature(s) Signed: 01/02/2023 10:52:07 AM By: Elliot Gurney, BSN, RN, CWS, Kim RN, BSN Signed: 01/05/2023 4:06:59 PM By: Allen Derry PA-C Entered By: Elliot Gurney BSN, RN, CWS, Kim on 01/02/2023 10:52:07 -------------------------------------------------------------------------------- Problem List Details Patient Name: Date of Service: Tracie Harrier, JA MES F. 12/30/2022 11:15 A  DENNIES, COATE ([id number]) [id number] Page 4 of 7 Medical Record Number: [id number] Patient Account Number: [id number] Date of Birth/Sex: Treating RN: 1955-09-05 (67 y.o. [name] Primary Care Provider: Sallyanne Kuster Other Clinician: Referring Provider: Treating Provider/Extender: Weldon Inches Weeks in Treatment: 19 Active Problems ICD-10 Encounter Code  Description Active Date MDM Diagnosis L02.31 Cutaneous abscess of buttock 08/15/2022 No Yes L98.492 Non-pressure chronic ulcer of skin of other sites with fat layer exposed 11/10/2022 No Yes S61.211A Laceration without foreign body of left index finger without damage to nail, 11/10/2022 No Yes initial encounter Inactive Problems Resolved Problems Electronic Signature(s) Signed: 01/09/2023 8:46:44 AM By: Allen Derry PA-C Entered By: Allen Derry on 01/09/2023 08:46:43 -------------------------------------------------------------------------------- Progress Note Details Patient Name: Date of Service: Larry Callahan MES F. 12/30/2022 11:15 A M Medical Record Number: [id number] Patient Account Number: [id number] Date of Birth/Sex: Treating RN: 05/17/1956 (67 y.o. [name] Primary Care Provider: Sallyanne Kuster Other Clinician: Referring Provider: Treating Provider/Extender: Weldon Inches Weeks in Treatment: 19 Subjective Chief Complaint Information obtained from Patient Right gluteal abscess and Left 2nd finger ulcer History of Present Illness (HPI) ADMISSION 08/15/2022 This is a 67 year old man who came in with very little medical information. There was some suggestion that he was in the hospital last month with cellulitis of the arm I will need to check this out thoroughly. He tells me that he received 2 courses of antibiotics last month were not really sure which ones they were but they are completed now. He is not really having any discomfort. His sister who lives nearby is dressing this with what sounds like Coban. I looked through [facility] health [facility]. He was in the hospital about 3 weeks ago for an infection of the right elbow. Prior to that he had been in an urgent care receiving Keflex. I do not think he was admitted to hospital but may have spent some time in the ER. I do not see any cultures. 3/11; patient presents for follow-up. He has been using Hydrofera Blue to the  right elbow. The wound has healed. Readmission: QUINDELL, SHERE ([id number]) [id number] Page 5 of 7 09-01-2022 upon evaluation today patient presents for reevaluation here in the clinic he actually was previously seen for his elbow today he comes in with the elbow okay but unfortunately a right gluteal abscess. Unfortunately he tells me that this is somewhat painful. He was seen in the ER they gave him doxycycline he has not picked that up yet that was just yesterday. 09-08-2022 upon evaluation today patient's wound actually appears to be excellent. In fact he called in stating that was bleeding profusely have a note in communications concerning that. Nonetheless the home health nurse and reassured me he was doing okay and this appears to be doing just fine today as well. I think it is actually showing less pressure compared to last time I saw him as far as the overall surrounding tissue and how things appear. 09-15-2022 upon evaluation today patient's wound unfortunately appears to be infected. I do believe that he is going to require intervention as far as antibiotics are concerned I am actually going to grab a wound culture today as well. 09-22-2022 upon evaluation today patient appears to be doing better in regard to his wound and actually very pleased with where things stand. Fortunately I do think that he is moving in the right direction. No fevers, chills, nausea, vomiting, or diarrhea. 09-30-2022 upon evaluation today patient appears to  be doing well currently in regard to his wound. This is actually showing signs of improvement which is great news. I am very pleased with where we stand. I do not see any evidence of active infection locally nor systemically which is great news. The patient in general tells me that he is still having some discomfort but this is not nearly as much as it was in the beginning. Overall I do believe that we are moving in the  right direction. 10-07-2022 upon evaluation today patient appears to be doing well currently in regard to his wound which is showing signs of improvement. Fortunately I do not see any evidence of active infection locally nor systemically which is great news. No fevers, chills, nausea, vomiting, or diarrhea. 10-17-2022 upon evaluation today patient appears to be doing well currently in regard to his wound though he continues to have fecal material in and around the dressing and wound. This is not good for the healing and may in fact be slowing things down quite a bit. 10-25-2022 upon evaluation today patient's wound actually appears to be making progress which is good news. Fortunately I do not see any signs of infection and the wound is measuring smaller. I do not feel there is any evidence of active infection locally nor systemically at this point. 11-01-2022 upon evaluation today patient appears to be doing well currently in regard to his wound which is actually showing signs of improvement. Fortunately there does not appear to be any evidence of active infection locally nor systemically which is great news. No fevers, chills, nausea, vomiting, or diarrhea. 11-10-2022 upon evaluation today patient appears to be doing well currently in regard to his wound in the gluteal region which is making progress here. Fortunately I do not see any signs of infection he is measuring smaller and again I felt removing in the right way here. In regard to his finger this is a new injury that he cannot even tell me how he injured this. This is of concern for sure but again I am not exactly sure the etiology behind what happened he feels he may have "hit it on something" nonetheless I really do not know although it does not look bruised. We have gotten a note from the home health nurse stating that he had shut it in a door. Nonetheless the patient actually denies this. I really do not know what happened here. 11-17-2022 upon  evaluation today patient appears to be doing well currently in regard to his wounds. Fortunately there does not appear to be any signs of active infection at this time. The finger is looking better and the wound in the gluteal region is also doing better. In general I think that we will make an some good progress here. 11-24-2022 upon evaluation today patient appears to be doing well with regard to the wound in the gluteal region. This is on the right. With that being said he does require some debridement regards to the finger there is a little bit of necrotic tissue with an x-ray of that away. 12-01-2022 upon evaluation patient appears to be doing well currently in regard to his wounds. He is actually showing signs of excellent improvement which is great news and in general I do think that we are moving in the right direction here. I do not see any signs of active infection locally nor systemically which is great news. Unfortunately the T egaderm did not stay in place he is going back to the bordered  gauze dressing. 12-08-2022 upon evaluation today patient actually appears to be doing excellent in fact he is showing signs of great improvement and very pleased with where we stand I do believe that we are moving in the right direction here. No fevers, chills, nausea, vomiting, or diarrhea. 12-16-2022 upon evaluation today patient appears to be doing well currently in regard to his wound. He has been tolerating the dressing changes without complication and the good news is he seems to be doing excellent at this point. I do not see any evidence of active infection locally nor systemically which is great news. Unfortunately in regard to his finger which has healed he actually does have evidence of an infection here this is swollen and erythematous it is not open there are no areas of drainage but at the same time I do think he is going require some treatment here. 12-23-2022 upon evaluation today patient appears  to be doing excellent in regard to his wounds. In fact I do believe that the wound in the gluteal area is probably healed although we will get a monitor 1 more week before complete closure I think though he will probably be ready for discharge at that time. Fortunately I do not see any signs of active infection locally or systemically at this time which is great news. 12-30-2022 this is a note that is being put in late secondary to the fact that computers were down across the world due to a [employer] outage. Therefore there may be some lack of detail compared to what would normally be present in the notes. With that being said the patient actually appears to be doing very well in fact he appears to be completely healed based on his wounds and what I am seeing at this point. I do not see anything that is open at any site I think he is ready for discharge. Objective Constitutional Well-nourished and well-hydrated in no acute distress. Vitals Time Taken: 11:15 AM, Height: 71 in, Weight: 229 lbs, BMI: 31.9, Temperature: 98.2 F, Pulse: 66 bpm, Respiratory Rate: 18 breaths/min, Blood Pressure: 118/74 mmHg. Respiratory normal breathing without difficulty. Psychiatric this patient is able to make decisions and demonstrates good insight into disease process. Alert and Oriented x 3. pleasant and cooperative. General Notes: Upon inspection patient's wound bed actually showed signs of good granulation and epithelization at this point. With that being said I do not see BLAND, RUDZINSKI ([id number]) [id number] Page 6 of 7 anything open currently and I think that the patient is actually making excellent progress towards complete closure. Integumentary (Hair, Skin) Wound #2 status is Healed - Epithelialized. Original cause of wound was Gradually Appeared. The date acquired was: 08/29/2022. The wound has been in treatment 17 weeks. The wound is located on the Right Gluteus. The wound  measures 0cm length x 0cm width x 0cm depth; 0cm^2 area and 0cm^3 volume. There is a medium amount of serosanguineous drainage noted. Assessment Active Problems ICD-10 Cutaneous abscess of buttock Non-pressure chronic ulcer of skin of other sites with fat layer exposed Laceration without foreign body of left index finger without damage to nail, initial encounter Plan Discharge From [facility] Services: Discharge from Wound Care Center Treatment Complete Home Health: DISCONTINUE Home Health for Wound Care. - treatment complete 1. I would recommend that we have the patient continue to monitor for any evidence of infection or worsening. Based on what I see I do believe that he is completely healed we are going to discontinue wound care services  at this point. 2. I am also can recommend patient should continue to monitor for any signs of infection or worsening overall. Obviously if anything changes he knows contact the office let me know. Will see him back for follow-up visit as needed. Electronic Signature(s) Signed: 01/09/2023 8:47:50 AM By: Allen Derry PA-C Entered By: Allen Derry on 01/09/2023 08:47:50 -------------------------------------------------------------------------------- SuperBill Details Patient Name: Date of Service: Tracie Harrier, JA MES F. 12/30/2022 Medical Record Number: [id number] Patient Account Number: [id number] Date of Birth/Sex: Treating RN: 19-Jan-1956 (67 y.o. [name] Primary Care Provider: Sallyanne Kuster Other Clinician: Betha Loa Referring Provider: Treating Provider/Extender: Weldon Inches Weeks in Treatment: 19 Diagnosis Coding ICD-10 Codes Code Description L02.31 Cutaneous abscess of buttock L98.492 Non-pressure chronic ulcer of skin of other sites with fat layer exposed S61.211A Laceration without foreign body of left index finger without damage to nail, initial encounter Facility Procedures : SOMTOCHUKWU, WOOLLARD  ([id number]) [id number] Description: [id number] - WOUND CARE VISIT-LEV 2 EST PT Modifier: [id number] Page 7 1 Quantity: of 7 Electronic Signature(s) Signed: 01/02/2023 10:52:49 AM By: Elliot Gurney, BSN, RN, CWS, Kim RN, BSN Signed: 01/05/2023 4:06:59 PM By: Allen Derry PA-C Entered By: Elliot Gurney BSN, RN, CWS, Kim on 01/02/2023 10:52:49

## 2023-01-05 NOTE — Telephone Encounter (Signed)
Orthopedic Surgery appointment 01/13/2023 @ [name]

## 2023-01-09 ENCOUNTER — Emergency Department
Admission: EM | Admit: 2023-01-09 | Discharge: 2023-01-10 | Disposition: A | Discharge status: Home / Self Care | Payer: Medicare PPO | Attending: Student in an Organized Health Care Education/Training Program | Admitting: Student in an Organized Health Care Education/Training Program

## 2023-01-09 ENCOUNTER — Emergency Department: Payer: Medicare PPO

## 2023-01-09 ENCOUNTER — Other Ambulatory Visit: Payer: Self-pay

## 2023-01-09 ENCOUNTER — Telehealth: Payer: Self-pay | Admitting: Nurse Practitioner

## 2023-01-09 ENCOUNTER — Encounter: Payer: Self-pay | Admitting: *Deleted

## 2023-01-09 DIAGNOSIS — Z7901 Long term (current) use of anticoagulants: Secondary | ICD-10-CM | POA: Insufficient documentation

## 2023-01-09 DIAGNOSIS — R0600 Dyspnea, unspecified: Secondary | ICD-10-CM | POA: Diagnosis not present

## 2023-01-09 DIAGNOSIS — R42 Dizziness and giddiness: Secondary | ICD-10-CM | POA: Diagnosis not present

## 2023-01-09 DIAGNOSIS — G4733 Obstructive sleep apnea (adult) (pediatric): Secondary | ICD-10-CM | POA: Diagnosis not present

## 2023-01-09 DIAGNOSIS — I10 Essential (primary) hypertension: Secondary | ICD-10-CM | POA: Diagnosis not present

## 2023-01-09 DIAGNOSIS — R0602 Shortness of breath: Secondary | ICD-10-CM | POA: Diagnosis not present

## 2023-01-09 DIAGNOSIS — R200 Anesthesia of skin: Secondary | ICD-10-CM | POA: Diagnosis not present

## 2023-01-09 LAB — BASIC METABOLIC PANEL
Anion gap: 8 (ref 5–15)
BUN: 55 mg/dL — ABNORMAL HIGH (ref 8–23)
CO2: 19 mmol/L — ABNORMAL LOW (ref 22–32)
Calcium: 8.3 mg/dL — ABNORMAL LOW (ref 8.9–10.3)
Chloride: 109 mmol/L (ref 98–111)
Creatinine, Ser: 2.73 mg/dL — ABNORMAL HIGH (ref 0.61–1.24)
GFR, Estimated: 24 mL/min — ABNORMAL LOW (ref >60)
Glucose, Bld: 96 mg/dL (ref 70–99)
Potassium: 4.6 mmol/L (ref 3.5–5.1)
Sodium: 136 mmol/L (ref 135–145)

## 2023-01-09 LAB — TROPONIN I (HIGH SENSITIVITY)
Troponin I (High Sensitivity): 10 ng/L (ref <18)
Troponin I (High Sensitivity): 9 ng/L (ref <18)

## 2023-01-09 LAB — BLOOD GAS, VENOUS
Acid-base deficit: 6.3 mmol/L — ABNORMAL HIGH (ref 0.0–2.0)
Bicarbonate: 18.1 mmol/L — ABNORMAL LOW (ref 20.0–28.0)
O2 Saturation: 50.3 %
Patient temperature: 37
pCO2, Ven: 32 mmHg — ABNORMAL LOW (ref 44–60)
pH, Ven: 7.36 (ref 7.25–7.43)
pO2, Ven: <31 mmHg — CL (ref 32–45)

## 2023-01-09 LAB — CBC
HCT: 27.4 % — ABNORMAL LOW (ref 39.0–52.0)
Hemoglobin: 8.7 g/dL — ABNORMAL LOW (ref 13.0–17.0)
MCH: 27.4 pg (ref 26.0–34.0)
MCHC: 31.8 g/dL (ref 30.0–36.0)
MCV: 86.2 fL (ref 80.0–100.0)
Platelets: 192 10*3/uL (ref 150–400)
RBC: 3.18 MIL/uL — ABNORMAL LOW (ref 4.22–5.81)
RDW: 14.8 % (ref 11.5–15.5)
WBC: 4.1 10*3/uL (ref 4.0–10.5)
nRBC: 0 % (ref 0.0–0.2)

## 2023-01-09 MED ORDER — CARVEDILOL 6.25 MG PO TABS
12.5000 mg | ORAL_TABLET | Freq: Two times a day (BID) | ORAL | Status: DC
  Administered 2023-01-09: 12.5 mg via ORAL
  Filled 2023-01-09: qty 2

## 2023-01-09 MED ORDER — HYDRALAZINE HCL 100 MG PO TABS: 100.0000 mg | ORAL_TABLET | Freq: Three times a day (TID) | ORAL | Status: DC

## 2023-01-09 MED ORDER — HYDRALAZINE HCL 50 MG PO TABS
100.0000 mg | ORAL_TABLET | Freq: Three times a day (TID) | ORAL | Status: DC
  Administered 2023-01-09: 100 mg via ORAL
  Filled 2023-01-09: qty 2

## 2023-01-09 MED ORDER — IPRATROPIUM-ALBUTEROL 0.5-2.5 (3) MG/3ML IN SOLN
3.0000 mL | Freq: Once | RESPIRATORY_TRACT | Status: AC
  Administered 2023-01-09: 3 mL via RESPIRATORY_TRACT
  Filled 2023-01-09: qty 3

## 2023-01-09 MED ORDER — APIXABAN 5 MG PO TABS
5.0000 mg | ORAL_TABLET | Freq: Two times a day (BID) | ORAL | Status: DC
  Administered 2023-01-09: 5 mg via ORAL
  Filled 2023-01-09: qty 1

## 2023-01-09 NOTE — ED Notes (Signed)
First Nurse Note: Patient to ED via [facility] from home. States headache and numbness in both arms. Also concerned about his O2 due to CPAP not working at home. VS WNL

## 2023-01-09 NOTE — ED Provider Notes (Signed)
[facility] Provider Note    Event Date/Time   First MD Initiated Contact with Patient 01/09/23 1801     (approximate)   History   Dizziness   HPI  Larry Callahan is a 67 y.o. male presents the ER due to concern for nonfunctioning CPAP machine at home.  States he has not been able to sleep because he usually sleeps with his CPAP.  Feels like his breathing is at baseline.  Has been fighting with the device provider and they are not able to come out until Thursday so his sister told him to come to the ER.  He denies any chest pain.  No nausea or vomiting no fevers.     Physical Exam   Triage Vital Signs: ED Triage Vitals  Encounter Vitals Group     BP 01/09/23 1700 (!) 160/96     Systolic BP Percentile --      Diastolic BP Percentile --      Pulse Rate 01/09/23 1700 77     Resp 01/09/23 1700 18     Temp 01/09/23 1700 98 F (36.7 C)     Temp Source 01/09/23 1700 Oral     SpO2 01/09/23 1700 99 %     Weight 01/09/23 1655 200 lb 9.9 oz (91 kg)     Height 01/09/23 1655 5\' 11"  (1.803 m)     Head Circumference --      Peak Flow --      Pain Score 01/09/23 1655 0     Pain Loc --      Pain Education --      Exclude from Growth Chart --     Most recent vital signs: Vitals:   01/09/23 1900 01/09/23 2111  BP: (!) 151/86 134/75  Pulse: (!) 105 98  Resp: (!) 23 20  Temp:  98.6 F (37 C)  SpO2: 99% 96%     Constitutional: Alert  Eyes: Conjunctivae are normal.  Head: Atraumatic. Nose: No congestion/rhinnorhea. Mouth/Throat: Mucous membranes are moist.   Neck: Painless ROM.  Cardiovascular:   Good peripheral circulation. Respiratory: Normal respiratory effort.  No retractions.  Gastrointestinal: Soft and nontender.  Musculoskeletal:  no deformity Neurologic:  MAE spontaneously. No gross focal neurologic deficits are appreciated.  Skin:  Skin is warm, dry and intact. No rash noted.    ED Results / Procedures / Treatments    Labs (all labs ordered are listed, but only abnormal results are displayed) Labs Reviewed  BASIC METABOLIC PANEL - Abnormal; Notable for the following components:      Result Value   CO2 19 (*)    BUN 55 (*)    Creatinine, Ser 2.73 (*)    Calcium 8.3 (*)    GFR, Estimated 24 (*)    All other components within normal limits  CBC - Abnormal; Notable for the following components:   RBC 3.18 (*)    Hemoglobin 8.7 (*)    HCT 27.4 (*)    All other components within normal limits  BLOOD GAS, VENOUS - Abnormal; Notable for the following components:   pCO2, Ven 32 (*)    pO2, Ven <31 (*)    Bicarbonate 18.1 (*)    Acid-base deficit 6.3 (*)    All other components within normal limits  TROPONIN I (HIGH SENSITIVITY)  TROPONIN I (HIGH SENSITIVITY)     EKG  ED ECG REPORT I, Willy Eddy, the attending physician, personally viewed and interpreted this ECG.  Date: 01/09/2023  EKG Time: 16:59  Rate: 95  Rhythm: a flutter  Axis: normal  Intervals:normal qt  ST&T Change: no stemi, no depressions    RADIOLOGY Please see ED Course for my review and interpretation.  I personally reviewed all radiographic images ordered to evaluate for the above acute complaints and reviewed radiology reports and findings.  These findings were personally discussed with the patient.  Please see medical record for radiology report.    PROCEDURES:  Critical Care performed: No  Procedures   MEDICATIONS ORDERED IN ED: Medications  apixaban (ELIQUIS) tablet 5 mg (5 mg Oral Given 01/09/23 2113)  carvedilol (COREG) tablet 12.5 mg (12.5 mg Oral Given 01/09/23 2113)  hydrALAZINE (APRESOLINE) tablet 100 mg (100 mg Oral Given 01/09/23 2113)  ipratropium-albuterol (DUONEB) 0.5-2.5 (3) MG/3ML nebulizer solution 3 mL (3 mLs Nebulization Given 01/09/23 1949)     IMPRESSION / MDM / ASSESSMENT AND PLAN / ED COURSE  I reviewed the triage vital signs and the nursing notes.                               Differential diagnosis includes, but is not limited to, Asthma, copd, CHF, pna, ptx, malignancy, Pe, anemia  Patient presenting to the ER for evaluation of symptoms as described above.  Based on symptoms, risk factors and considered above differential, this presenting complaint could reflect a potentially life-threatening illness therefore the patient will be placed on continuous pulse oximetry and telemetry for monitoring.  Laboratory evaluation will be sent to evaluate for the above complaints.  Patient well-known to this facility.  Presenting to the ER today due to nonfunctioning home equipment.  He says that he cannot breathe though he is speaking in long sentences without any respiratory distress and his O2 saturation on room air does not drop below 99%.  His VBG is normal he is not retaining CO2.  Chest x-ray on my review and interpretation does not show any evidence of cardiomegaly or edema.  His metabolic panel is at baseline.  His hemoglobin is stable.  No leukocytosis.  His initial troponin is stable.   Clinical Course as of 01/09/23 2132  Mon Jan 09, 2023  1934 Patient's troponins are negative.  Will give evening doses of medications.  Will give nebulizer treatment. [PR]  2130 Patient reassessed.  Remains in no acute distress.  Remains satting well on room air.  He does appear stable and appropriate for outpatient follow-up. [PR]    Clinical Course User Index [PR] Willy Eddy, MD     FINAL CLINICAL IMPRESSION(S) / ED DIAGNOSES   Final diagnoses:  Dyspnea, unspecified type     Rx / DC Orders   ED Discharge Orders     None        Note:  This document was prepared using Dragon voice recognition software and may include unintentional dictation errors.    Willy Eddy, MD 01/09/23 2132

## 2023-01-09 NOTE — ED Triage Notes (Signed)
Pt brought in via ems from home with dizziness, numbness in arms.  Pt states his cpap machine is not working and pt isn't sleeping.  No chest pain.  No sob.  Pt alert  speech clear.

## 2023-01-09 NOTE — ED Notes (Signed)
Patient brought back to triage. Patient stating to staff he no longer wants to be seen but states he has no way home. Patient taken to phone to attempt to call sister for ride home.

## 2023-01-09 NOTE — Telephone Encounter (Signed)
Patient's sister called regarding cpap and supply set up. While I was trying to talk, she kept talking over me stating what patient needed and that we were not helping him. I told her to put patient on phone. I explained to patient again that we are just waiting to hear back from his insurance regarding pap set up and supplies. While talking to patient, I could hear sister bad mouthing me, yelling that he needs to find another provider since we don't care about him. I told patient he needs to tell his sister to stop and that I do not appreciate her making those remarks when he knows I have been going over and beyond to help him with this situation. I told patient I will call [facility], then call him right back at his sister's home. Per Princess Perna is about to call patient for set up. I called patient back to let him know. I told him to head home to receive call. Went to give him telephone # for [facility] in case he did not hear from them. He asked me to give information to sister. He told her to be nice to me since I have been helping him.  Received call back from [place]. She stated Brett Canales said patient told him that it was his o2 machine that was not working, not his cpap. I spoke with Lurena Joiner with [facility]. She stated they sent out new cpap supplies today, and stated patient had not called them to let them know o2 machine was not working. She will have someone service the machine tomorrow. I notified patient of supplies and that someone will be at his home tomorrow to service machine. He stated he was waiting on ems to come take him to hospital due to sob, HA. He will let me know if he is hospitalized so I can let [employer] know-[name]

## 2023-01-10 ENCOUNTER — Telehealth: Payer: Self-pay | Admitting: Nurse Practitioner

## 2023-01-10 NOTE — Telephone Encounter (Addendum)
Attempted to f/u with patient. No answer at home. Per ED nurse station, patient d/c this morning around 7:00 am. Called patient's sister. She is not sure where patient is, she stated he didn't have a ride home. Patient returned my call, stated he was home. I called [employer] to see what time they would be servicing his o2 machine. They gave me time window of 9:30-1:30. I notified patient. He stated he will be at his sister's house (3 trailers down from him) and would watch for [employer]. I tried to get him to stay home until they had serviced his machine. He insisted on going to his sister's-[name]

## 2023-01-11 ENCOUNTER — Telehealth: Payer: Self-pay

## 2023-01-11 ENCOUNTER — Other Ambulatory Visit: Payer: Self-pay | Admitting: Nurse Practitioner

## 2023-01-11 ENCOUNTER — Telehealth: Payer: Self-pay | Admitting: Nurse Practitioner

## 2023-01-11 DIAGNOSIS — Z794 Long term (current) use of insulin: Secondary | ICD-10-CM

## 2023-01-11 NOTE — Telephone Encounter (Signed)
Patient called this morning letting me know his o2 concentrator was serviced yesterday, but he is unsure how to hook up all equipment. He asked me if he is switching to [facility] or not. Per Waynetta Sandy, he will need to pay $400 upfront since his deductible has not been met. Patient stated he will stay with [facility]. I called [facility] to see what they could do regarding equipment hook up. They stated they have re-instructed patient numerous times. He will need to come to office in [place] with cpap to show him again. Patient stated he does not have a ride. I called [facility], they only go out of county if patient has medicaid. I told patient to call his insurance to set up transportation thru them. He stated if the supplies that were sent from [facility] is what he needs, he can probably hook up equipment. He will call them to verify supplies-[name]

## 2023-01-13 DIAGNOSIS — M170 Bilateral primary osteoarthritis of knee: Secondary | ICD-10-CM | POA: Diagnosis not present

## 2023-01-13 NOTE — Telephone Encounter (Signed)
Pt med was refilled

## 2023-02-06 NOTE — Progress Notes (Deleted)
No show

## 2023-02-07 IMAGING — CR DG CHEST 2V
1 series · 2 of 2 positions shown · non-contrast
Comparison: 06/11/2021 and older studies.

CLINICAL DATA: Chest pain and nausea vomiting since this morning.

EXAM:
CHEST - 2 VIEW

[Series 1: dg chest 2 view · 0.14mm/px · 2 of 2 slices shown]
[im 1/2]
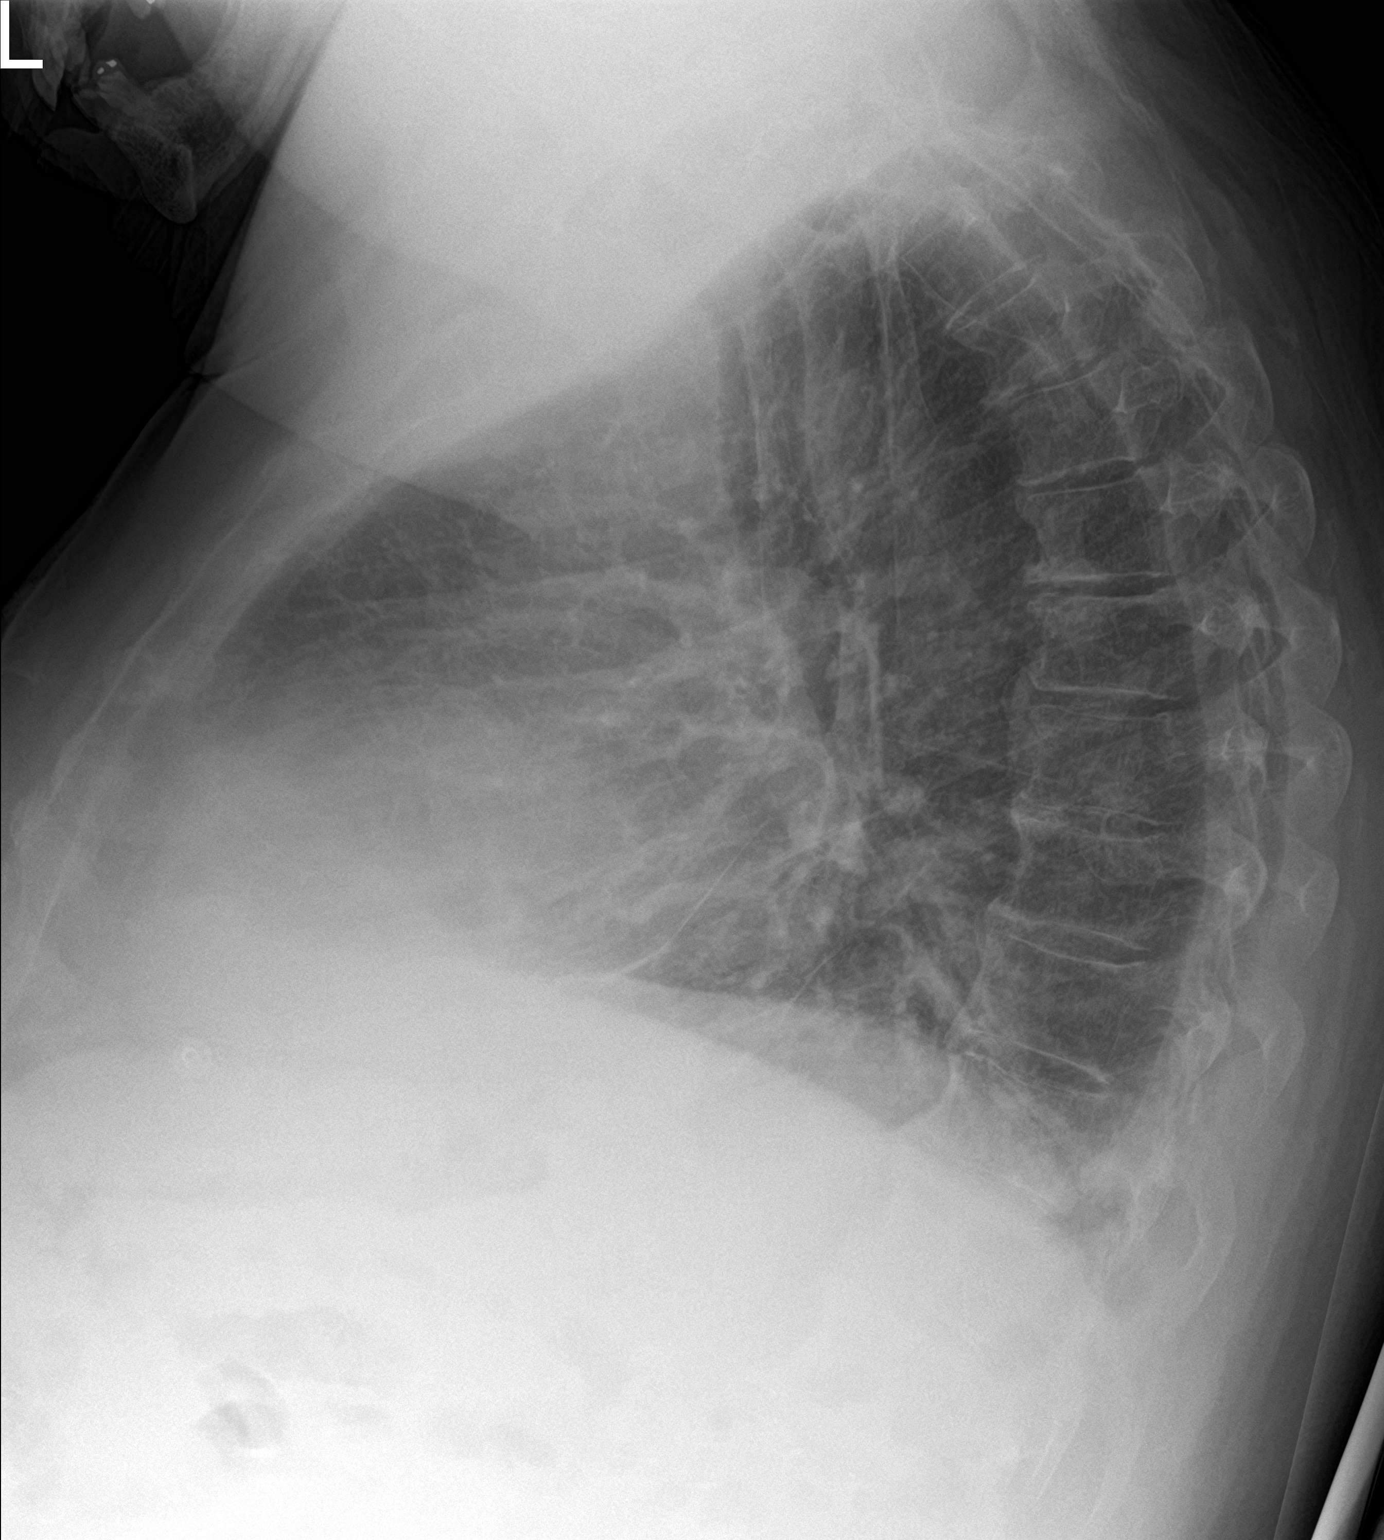
[im 2/2]
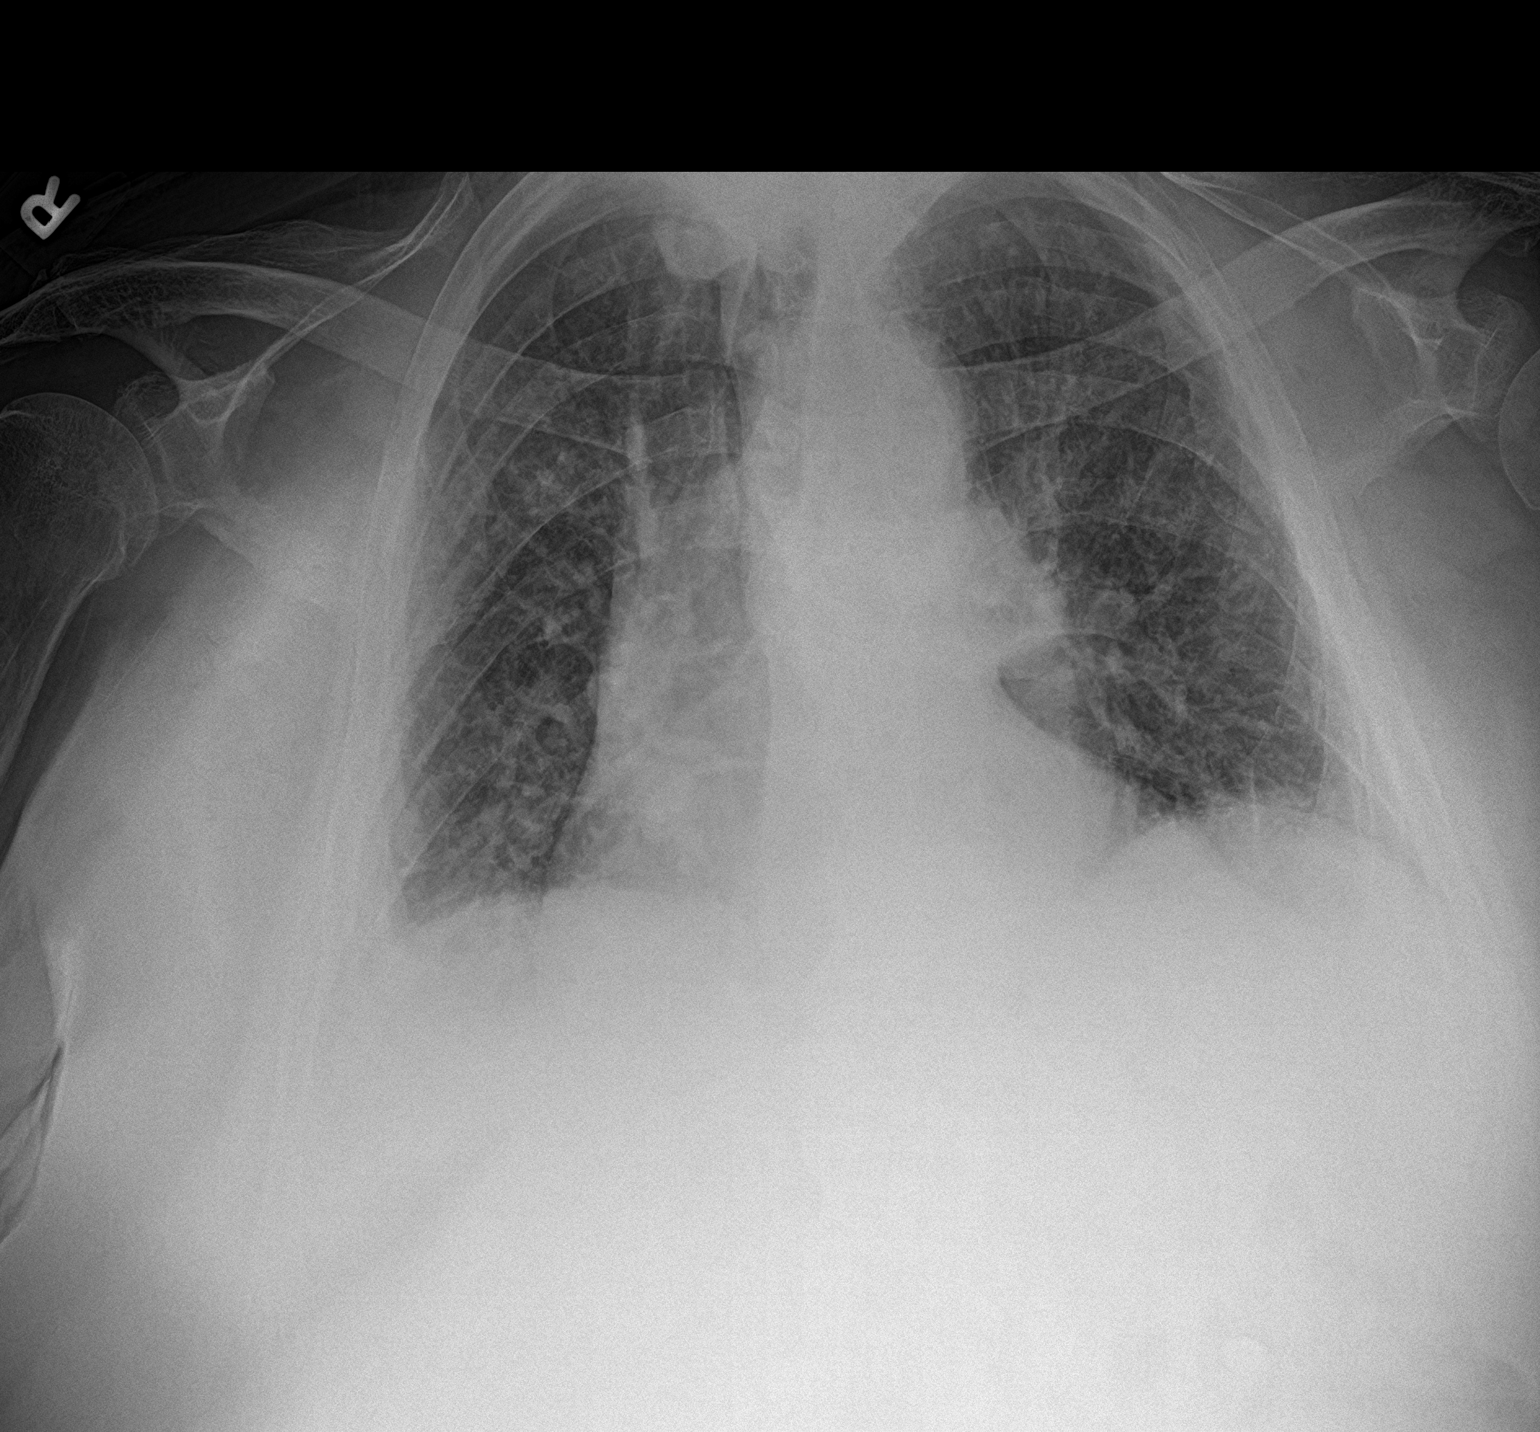

[2 of 2 positions shown; findings below may reference images not displayed]

FINDINGS: Cardiac silhouette mildly enlarged.  No mediastinal or hilar masses.

Prominent vascular markings similar to the prior study. No lung
consolidation. No convincing edema. No pleural effusion or
pneumothorax.

Skeletal structures are grossly intact.
IMPRESSION: No acute cardiopulmonary disease.

## 2023-02-08 IMAGING — NM NM PULMONARY PERF PARTICULATE
1 series · 8 of 8 positions shown · non-contrast
Comparison: Chest radiograph, 06/27/2021

CLINICAL DATA: PE suspected, positive D-dimer

EXAM:
NUCLEAR MEDICINE PERFUSION LUNG SCAN
TECHNIQUE: Perfusion images were obtained in multiple projections after
intravenous injection of radiopharmaceutical.
Ventilation scans intentionally deferred if perfusion scan and chest
x-ray adequate for interpretation during COVID 19 epidemic.
RADIOPHARMACEUTICALS:  4.48 mCi Zc-88m MAA IV

[Series 1000: lung perfusion · 1.65mm/px · 4 acquisitions, 8 frames shown]
[im 1/4]
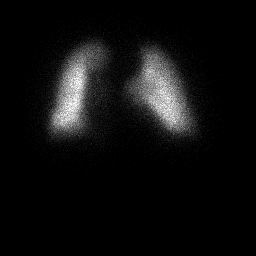
[im 1/4]
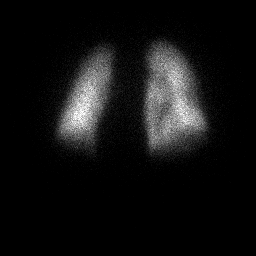
[im 2/4]
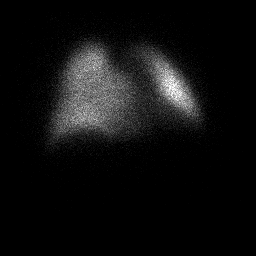
[im 2/4]
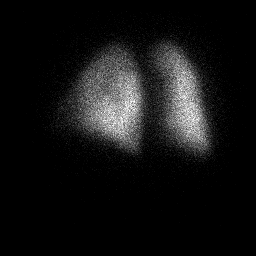
[im 3/4  full-range]
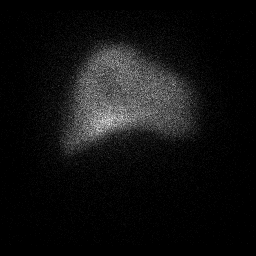
[im 3/4  full-range]
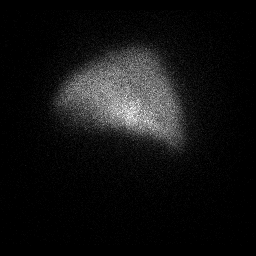
[im 4/4]
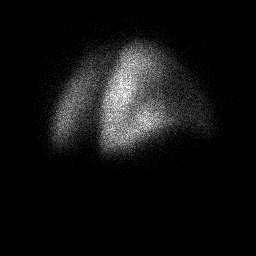
[im 4/4]
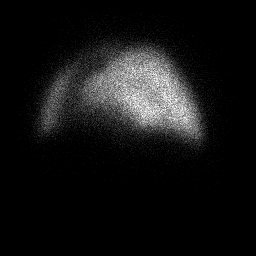

[8 of 8 positions shown; findings below may reference images not displayed]

FINDINGS: Normal, homogeneous pulmonary perfusion. No suspicious filling
defects.

Cardiomegaly.
IMPRESSION: 1. Very low probability of pulmonary embolism by modified perfusion
only PIOPED criteria (PE absent).

2.  Cardiomegaly.

## 2023-02-10 ENCOUNTER — Ambulatory Visit: Payer: Medicare PPO | Attending: Cardiovascular Disease | Admitting: Cardiovascular Disease

## 2023-02-10 DIAGNOSIS — I5020 Unspecified systolic (congestive) heart failure: Secondary | ICD-10-CM

## 2023-02-10 DIAGNOSIS — I483 Typical atrial flutter: Secondary | ICD-10-CM

## 2023-02-10 DIAGNOSIS — I2510 Atherosclerotic heart disease of native coronary artery without angina pectoris: Secondary | ICD-10-CM

## 2023-02-10 DIAGNOSIS — I10 Essential (primary) hypertension: Secondary | ICD-10-CM

## 2023-02-10 DIAGNOSIS — I428 Other cardiomyopathies: Secondary | ICD-10-CM

## 2023-02-10 DIAGNOSIS — E782 Mixed hyperlipidemia: Secondary | ICD-10-CM

## 2023-02-10 DIAGNOSIS — E1122 Type 2 diabetes mellitus with diabetic chronic kidney disease: Secondary | ICD-10-CM

## 2023-02-23 ENCOUNTER — Other Ambulatory Visit: Payer: Self-pay

## 2023-02-23 MED ORDER — TOUJEO MAX SOLOSTAR 300 UNIT/ML ~~LOC~~ SOPN: PEN_INJECTOR | SUBCUTANEOUS | 3 refills | Status: DC

## 2023-03-07 ENCOUNTER — Other Ambulatory Visit: Payer: Self-pay | Admitting: Internal Medicine

## 2023-03-16 ENCOUNTER — Other Ambulatory Visit: Payer: Self-pay | Admitting: Family

## 2023-03-18 IMAGING — CR DG CHEST 2V
1 series · 2 of 2 positions shown · non-contrast
Comparison: Radiograph 06/27/2021

CLINICAL DATA: Chest pain, shortness of breath

EXAM:
CHEST - 2 VIEW

[Series 1: dg chest 2 view · 0.14mm/px · 2 of 2 slices shown]
[im 1/2]
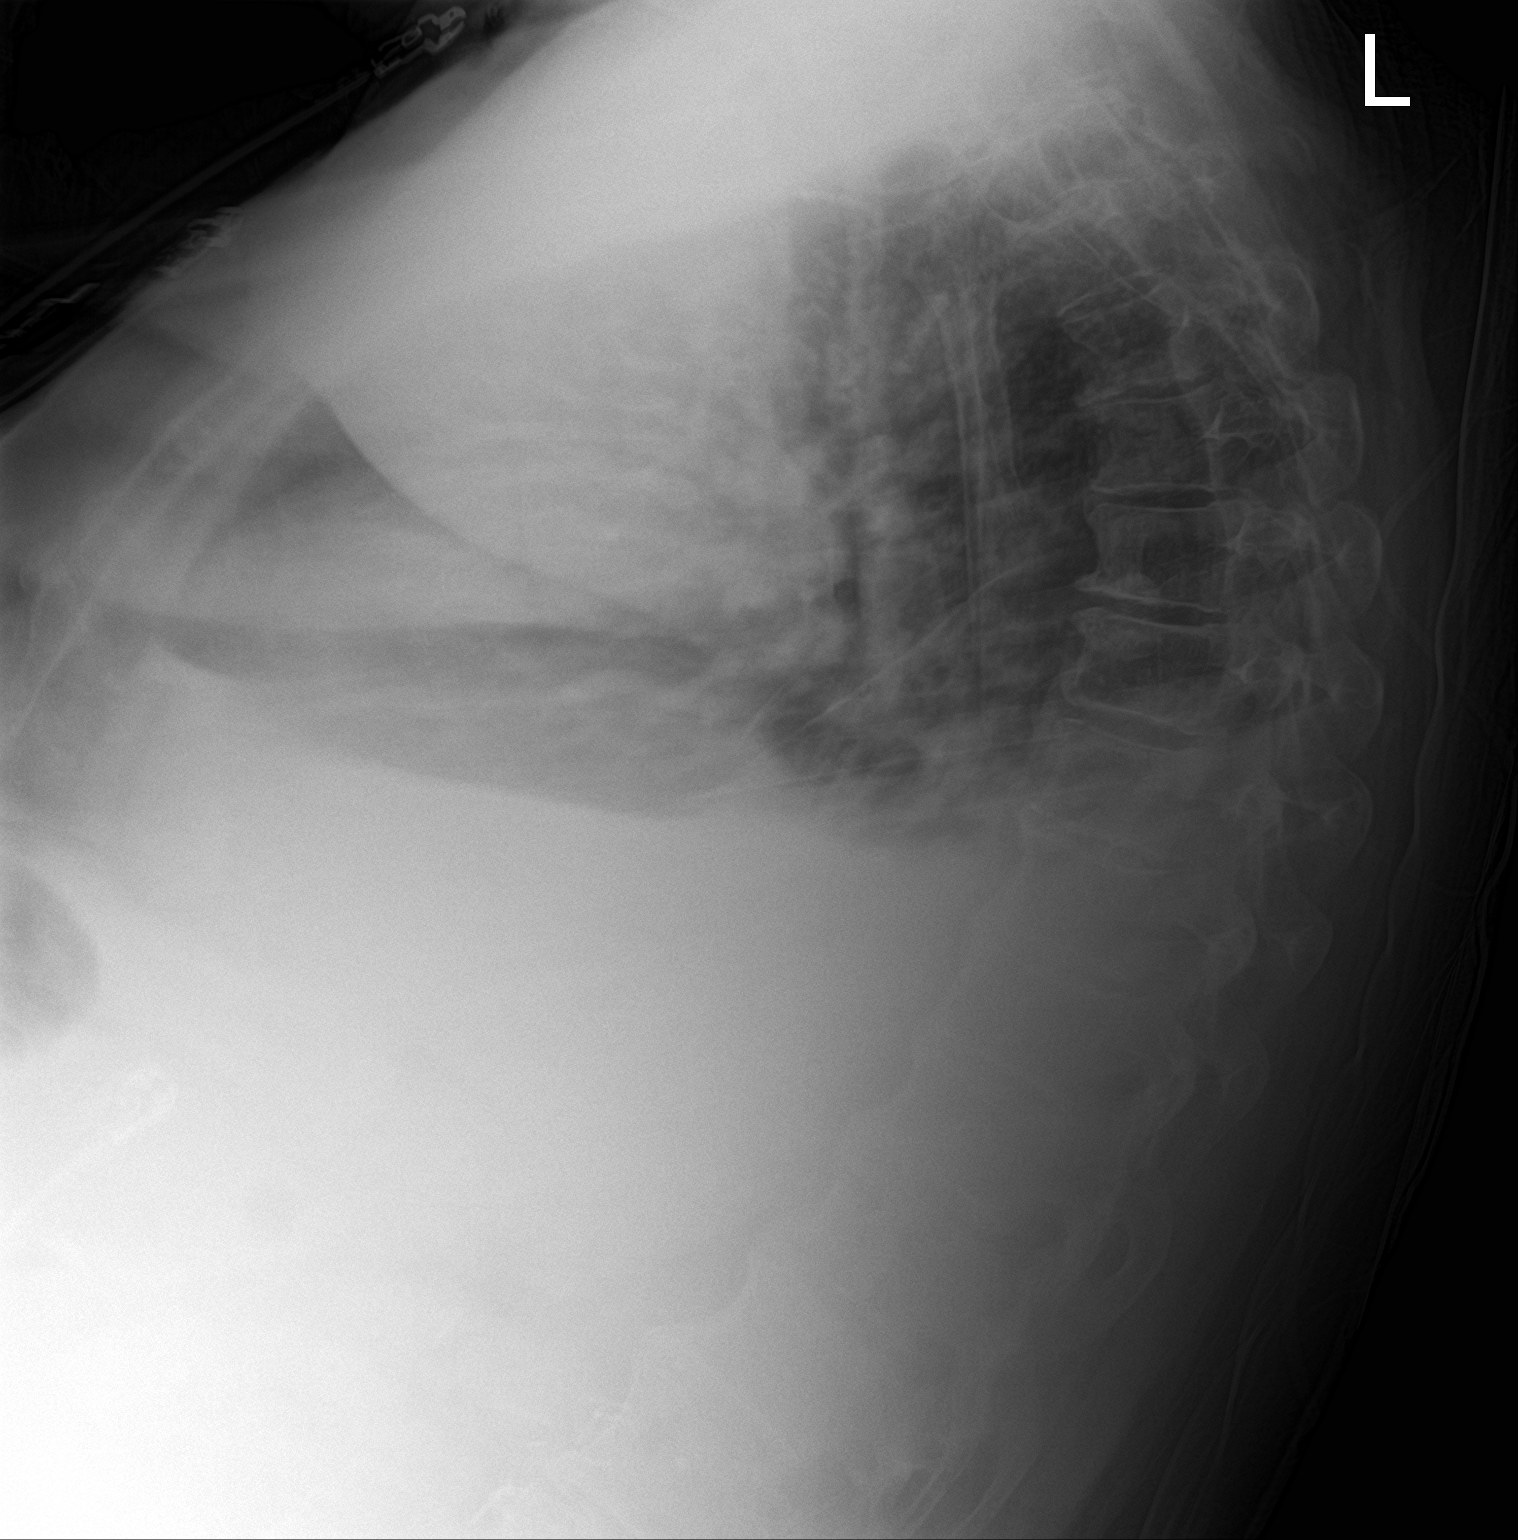
[im 2/2]
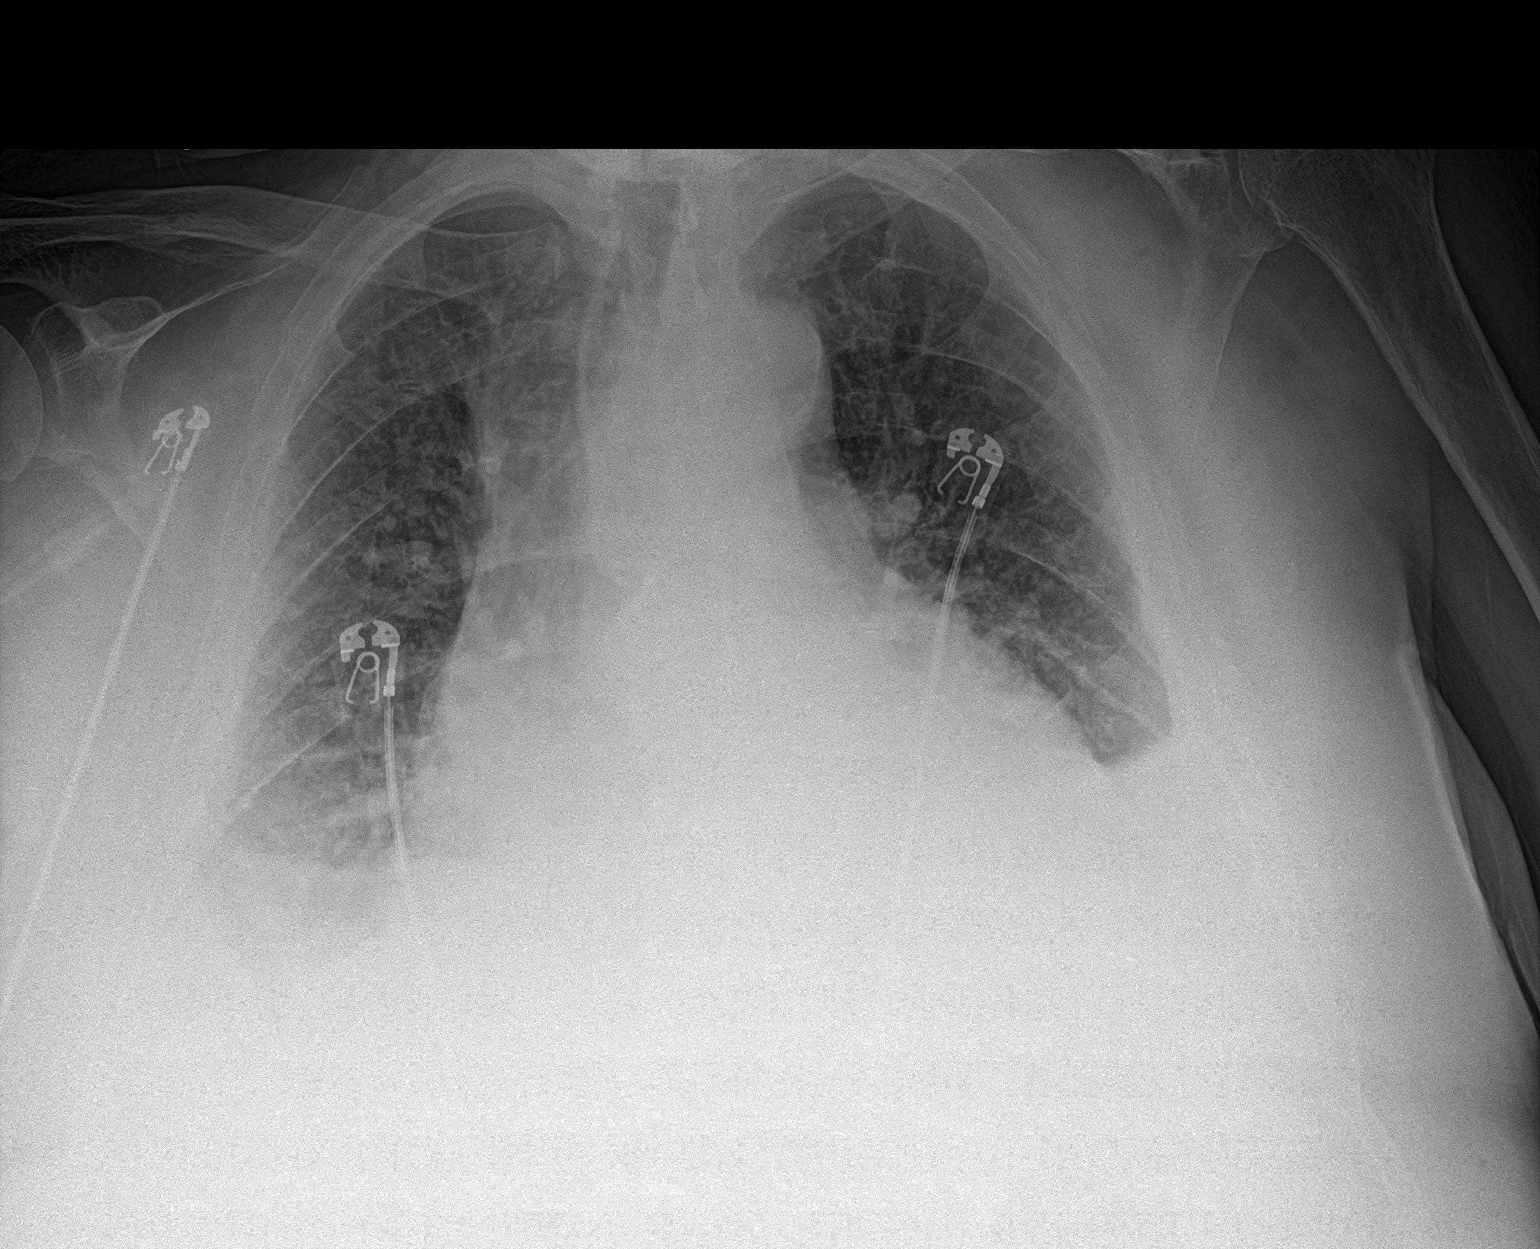

[2 of 2 positions shown; findings below may reference images not displayed]

FINDINGS: Unchanged, enlarged cardiac silhouette. Diffuse interstitial
opacities. Small bilateral pleural effusions with adjacent basilar
opacities, likely atelectasis. No pneumothorax. No acute osseous
abnormality.
IMPRESSION: Mild pulmonary edema and small bilateral pleural effusions with
adjacent basilar atelectasis.

## 2023-03-20 IMAGING — CR DG CHEST 2V
1 series · 2 of 2 positions shown · non-contrast
Comparison: 08/05/2021 and older studies.

CLINICAL DATA: Pt admitted to hospital for SOB on [DATE]. Recent
imaging showing pleural effusion. Hx of CHF, COPD, CKD, diabetes,
cardiomyopathy.

EXAM:
CHEST - 2 VIEW

[Series 1: dg chest 2 view · 0.14mm/px · 2 of 2 slices shown]
[im 1/2]
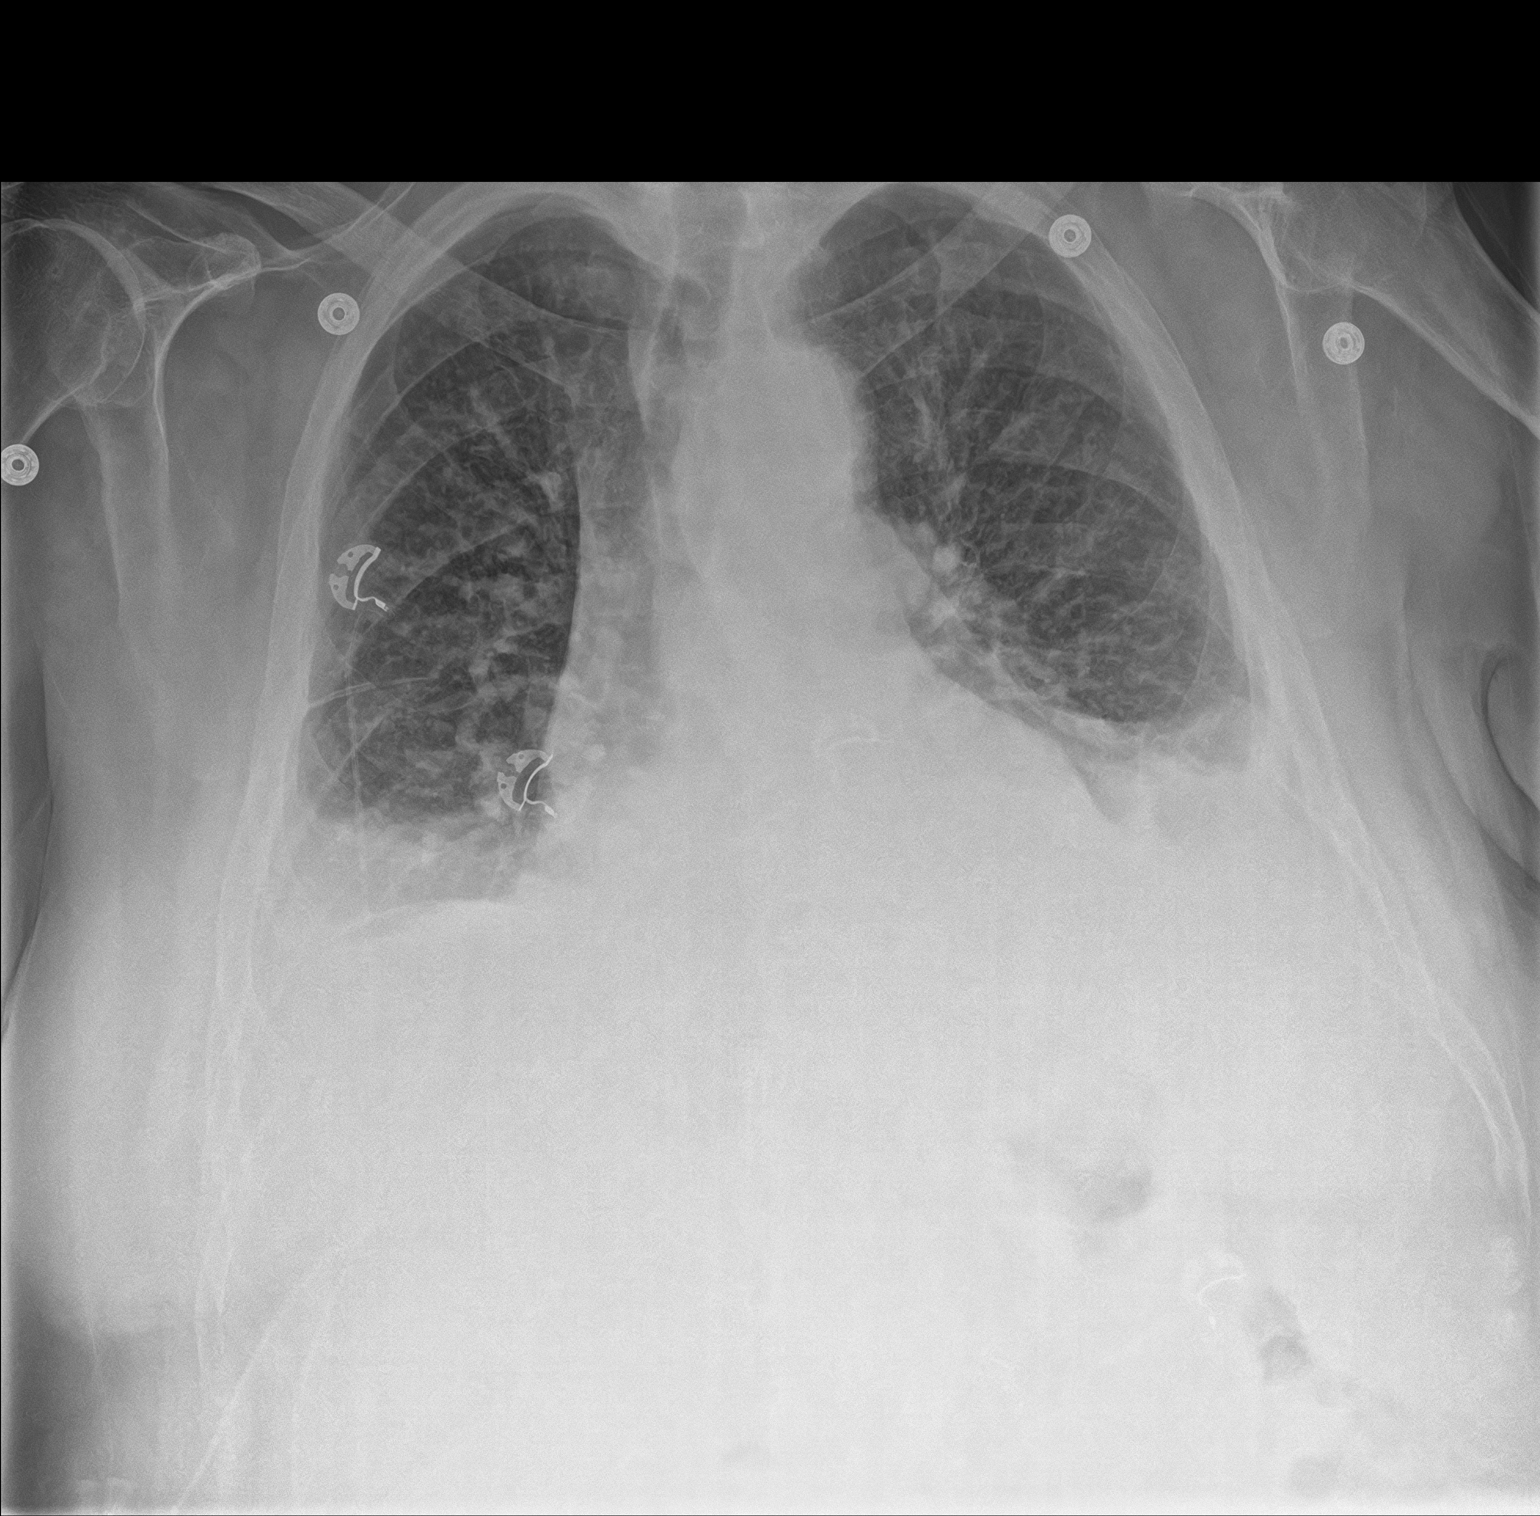
[im 2/2]
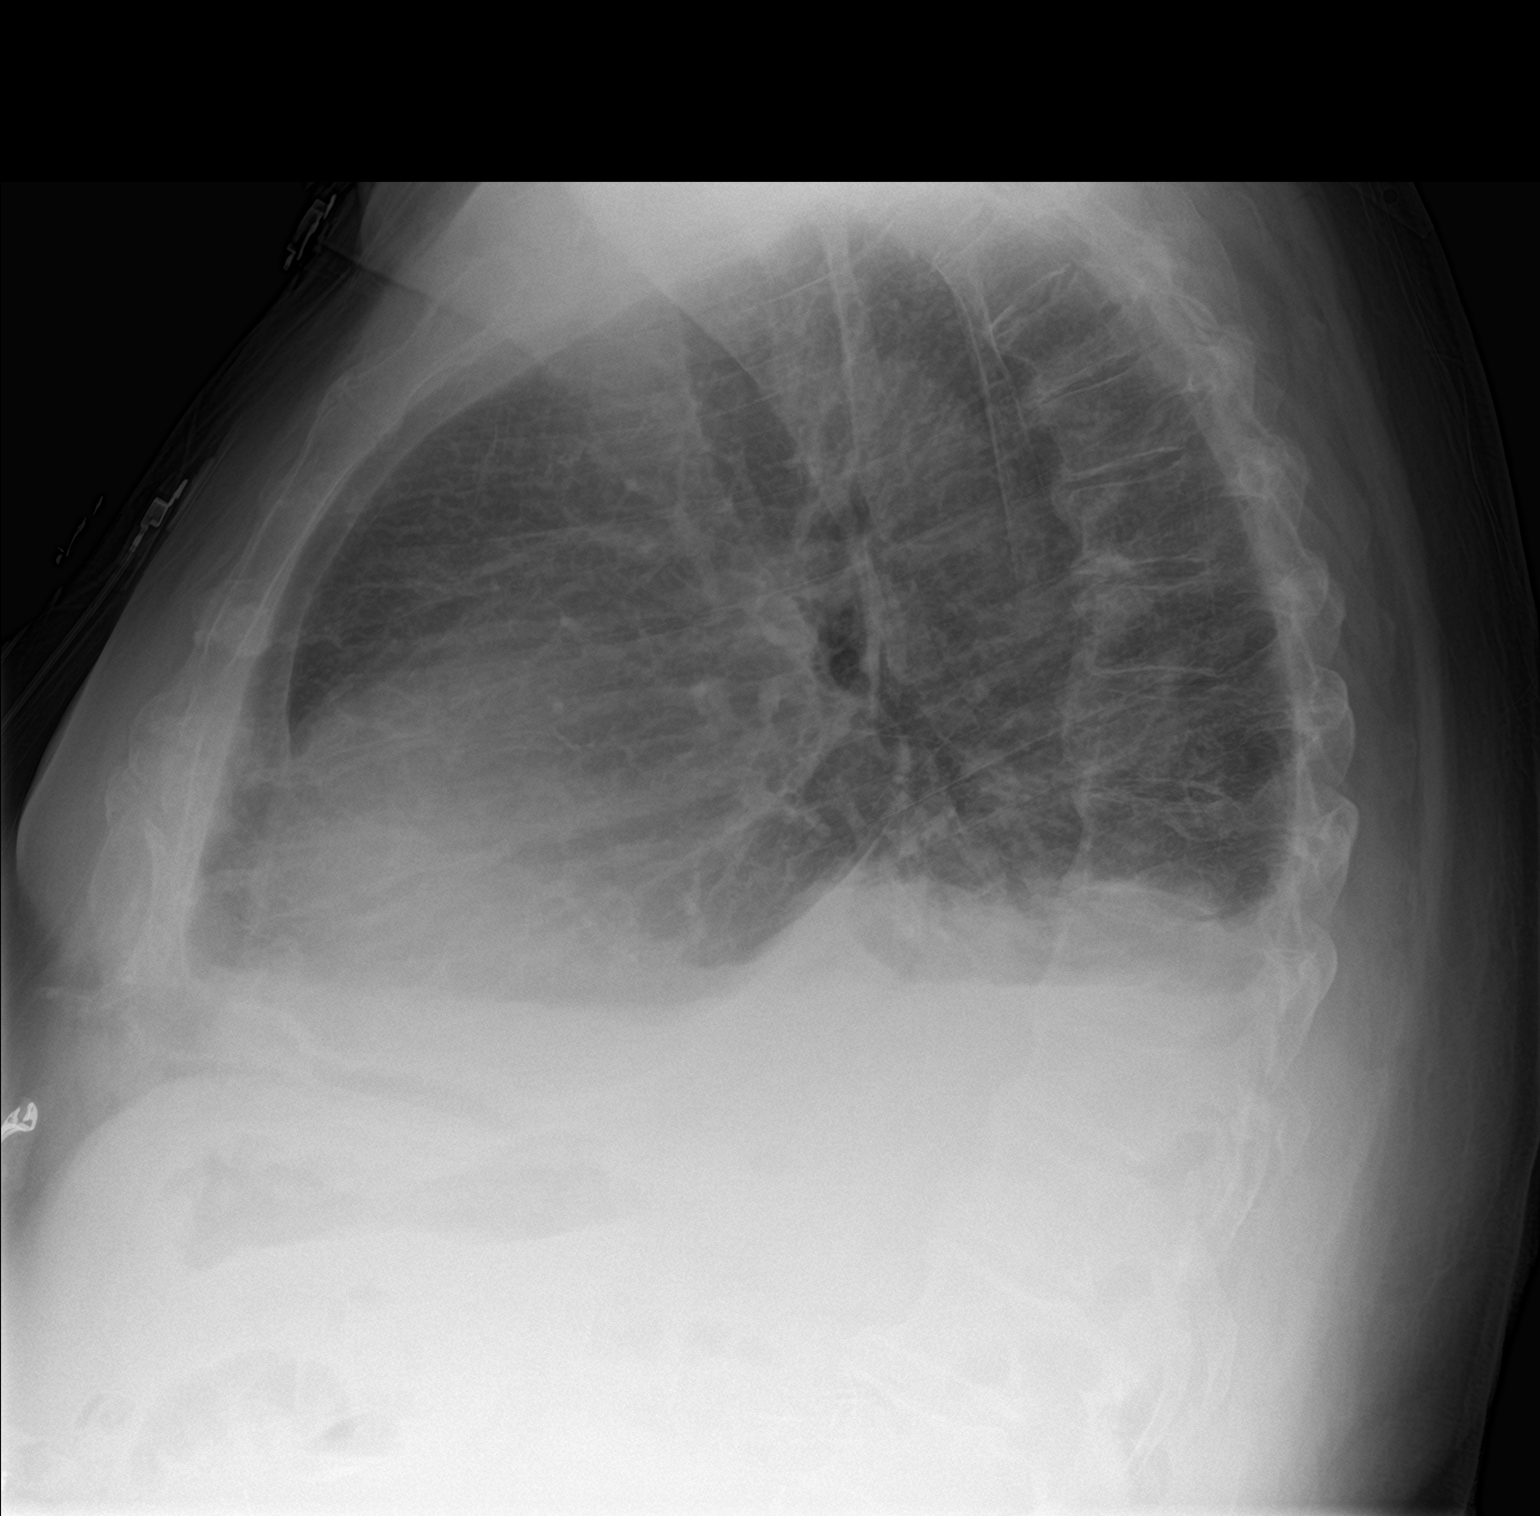

[2 of 2 positions shown; findings below may reference images not displayed]

FINDINGS: Bilateral effusions and associated lung base opacities obscure the
hemidiaphragms, unchanged. Bilateral vascular congestion and
interstitial prominence is also unchanged.

Cardiac silhouette is mostly obscured, mildly enlarged. No
mediastinal or hilar masses.

No pneumothorax.
IMPRESSION: 1. No convincing change from the most recent prior study.
2. Bilateral pleural effusions with associated lung base opacity,
lung base opacities most likely atelectasis. Cardiomegaly, vascular
congestion with mild interstitial prominence suggests mild
congestive heart failure.

## 2023-03-28 ENCOUNTER — Telehealth: Payer: Self-pay | Admitting: Nurse Practitioner

## 2023-03-28 NOTE — Telephone Encounter (Signed)
Left vm to confirm 04/03/23 appointment-[name]

## 2023-04-03 ENCOUNTER — Ambulatory Visit: Payer: Medicare PPO | Admitting: Nurse Practitioner

## 2023-04-03 DIAGNOSIS — N179 Acute kidney failure, unspecified: Secondary | ICD-10-CM | POA: Diagnosis not present

## 2023-04-03 DIAGNOSIS — K5731 Diverticulosis of large intestine without perforation or abscess with bleeding: Secondary | ICD-10-CM | POA: Diagnosis not present

## 2023-04-03 DIAGNOSIS — J449 Chronic obstructive pulmonary disease, unspecified: Secondary | ICD-10-CM | POA: Diagnosis not present

## 2023-04-03 DIAGNOSIS — I272 Pulmonary hypertension, unspecified: Secondary | ICD-10-CM | POA: Diagnosis not present

## 2023-04-03 DIAGNOSIS — J440 Chronic obstructive pulmonary disease with acute lower respiratory infection: Secondary | ICD-10-CM | POA: Diagnosis not present

## 2023-04-03 DIAGNOSIS — R279 Unspecified lack of coordination: Secondary | ICD-10-CM | POA: Diagnosis not present

## 2023-04-03 DIAGNOSIS — K635 Polyp of colon: Secondary | ICD-10-CM | POA: Diagnosis not present

## 2023-04-03 DIAGNOSIS — R059 Cough, unspecified: Secondary | ICD-10-CM | POA: Diagnosis not present

## 2023-04-03 DIAGNOSIS — D62 Acute posthemorrhagic anemia: Secondary | ICD-10-CM | POA: Diagnosis not present

## 2023-04-03 DIAGNOSIS — N189 Chronic kidney disease, unspecified: Secondary | ICD-10-CM | POA: Diagnosis not present

## 2023-04-03 DIAGNOSIS — R808 Other proteinuria: Secondary | ICD-10-CM | POA: Diagnosis not present

## 2023-04-03 DIAGNOSIS — R5381 Other malaise: Secondary | ICD-10-CM | POA: Diagnosis not present

## 2023-04-03 DIAGNOSIS — I2510 Atherosclerotic heart disease of native coronary artery without angina pectoris: Secondary | ICD-10-CM | POA: Diagnosis not present

## 2023-04-03 DIAGNOSIS — A499 Bacterial infection, unspecified: Secondary | ICD-10-CM | POA: Diagnosis not present

## 2023-04-03 DIAGNOSIS — J15212 Pneumonia due to Methicillin resistant Staphylococcus aureus: Secondary | ICD-10-CM | POA: Diagnosis not present

## 2023-04-03 DIAGNOSIS — K625 Hemorrhage of anus and rectum: Secondary | ICD-10-CM | POA: Diagnosis not present

## 2023-04-03 DIAGNOSIS — K921 Melena: Secondary | ICD-10-CM | POA: Diagnosis not present

## 2023-04-03 DIAGNOSIS — J9601 Acute respiratory failure with hypoxia: Secondary | ICD-10-CM | POA: Diagnosis not present

## 2023-04-03 DIAGNOSIS — I4892 Unspecified atrial flutter: Secondary | ICD-10-CM | POA: Diagnosis not present

## 2023-04-03 DIAGNOSIS — J90 Pleural effusion, not elsewhere classified: Secondary | ICD-10-CM | POA: Diagnosis not present

## 2023-04-03 DIAGNOSIS — J441 Chronic obstructive pulmonary disease with (acute) exacerbation: Secondary | ICD-10-CM | POA: Diagnosis not present

## 2023-04-03 DIAGNOSIS — T380X5A Adverse effect of glucocorticoids and synthetic analogues, initial encounter: Secondary | ICD-10-CM | POA: Diagnosis not present

## 2023-04-03 DIAGNOSIS — K5791 Diverticulosis of intestine, part unspecified, without perforation or abscess with bleeding: Secondary | ICD-10-CM | POA: Diagnosis not present

## 2023-04-03 DIAGNOSIS — I129 Hypertensive chronic kidney disease with stage 1 through stage 4 chronic kidney disease, or unspecified chronic kidney disease: Secondary | ICD-10-CM | POA: Diagnosis not present

## 2023-04-03 DIAGNOSIS — J9811 Atelectasis: Secondary | ICD-10-CM | POA: Diagnosis not present

## 2023-04-03 DIAGNOSIS — I10 Essential (primary) hypertension: Secondary | ICD-10-CM | POA: Diagnosis not present

## 2023-04-03 DIAGNOSIS — E11649 Type 2 diabetes mellitus with hypoglycemia without coma: Secondary | ICD-10-CM | POA: Diagnosis not present

## 2023-04-03 DIAGNOSIS — I5043 Acute on chronic combined systolic (congestive) and diastolic (congestive) heart failure: Secondary | ICD-10-CM | POA: Diagnosis not present

## 2023-04-03 DIAGNOSIS — I340 Nonrheumatic mitral (valve) insufficiency: Secondary | ICD-10-CM | POA: Diagnosis not present

## 2023-04-03 DIAGNOSIS — E1165 Type 2 diabetes mellitus with hyperglycemia: Secondary | ICD-10-CM | POA: Diagnosis not present

## 2023-04-03 DIAGNOSIS — Z9889 Other specified postprocedural states: Secondary | ICD-10-CM | POA: Diagnosis not present

## 2023-04-03 DIAGNOSIS — N2889 Other specified disorders of kidney and ureter: Secondary | ICD-10-CM | POA: Diagnosis not present

## 2023-04-03 DIAGNOSIS — I130 Hypertensive heart and chronic kidney disease with heart failure and stage 1 through stage 4 chronic kidney disease, or unspecified chronic kidney disease: Secondary | ICD-10-CM | POA: Diagnosis not present

## 2023-04-03 DIAGNOSIS — M6281 Muscle weakness (generalized): Secondary | ICD-10-CM | POA: Diagnosis not present

## 2023-04-03 DIAGNOSIS — E875 Hyperkalemia: Secondary | ICD-10-CM | POA: Diagnosis not present

## 2023-04-03 DIAGNOSIS — N281 Cyst of kidney, acquired: Secondary | ICD-10-CM | POA: Diagnosis not present

## 2023-04-03 DIAGNOSIS — R079 Chest pain, unspecified: Secondary | ICD-10-CM | POA: Diagnosis not present

## 2023-04-03 DIAGNOSIS — E119 Type 2 diabetes mellitus without complications: Secondary | ICD-10-CM | POA: Diagnosis not present

## 2023-04-03 DIAGNOSIS — N184 Chronic kidney disease, stage 4 (severe): Secondary | ICD-10-CM | POA: Diagnosis not present

## 2023-04-03 DIAGNOSIS — R933 Abnormal findings on diagnostic imaging of other parts of digestive tract: Secondary | ICD-10-CM | POA: Diagnosis not present

## 2023-04-03 DIAGNOSIS — E1122 Type 2 diabetes mellitus with diabetic chronic kidney disease: Secondary | ICD-10-CM | POA: Diagnosis not present

## 2023-04-03 DIAGNOSIS — I5033 Acute on chronic diastolic (congestive) heart failure: Secondary | ICD-10-CM | POA: Diagnosis not present

## 2023-04-03 DIAGNOSIS — D369 Benign neoplasm, unspecified site: Secondary | ICD-10-CM | POA: Diagnosis not present

## 2023-04-03 DIAGNOSIS — Z905 Acquired absence of kidney: Secondary | ICD-10-CM | POA: Diagnosis not present

## 2023-04-03 DIAGNOSIS — D649 Anemia, unspecified: Secondary | ICD-10-CM | POA: Diagnosis not present

## 2023-04-03 DIAGNOSIS — I484 Atypical atrial flutter: Secondary | ICD-10-CM | POA: Diagnosis not present

## 2023-04-21 DIAGNOSIS — N179 Acute kidney failure, unspecified: Secondary | ICD-10-CM | POA: Diagnosis not present

## 2023-04-21 DIAGNOSIS — J811 Chronic pulmonary edema: Secondary | ICD-10-CM | POA: Diagnosis not present

## 2023-04-21 DIAGNOSIS — I130 Hypertensive heart and chronic kidney disease with heart failure and stage 1 through stage 4 chronic kidney disease, or unspecified chronic kidney disease: Secondary | ICD-10-CM | POA: Diagnosis not present

## 2023-04-21 DIAGNOSIS — K921 Melena: Secondary | ICD-10-CM | POA: Diagnosis not present

## 2023-04-21 DIAGNOSIS — E1122 Type 2 diabetes mellitus with diabetic chronic kidney disease: Secondary | ICD-10-CM | POA: Diagnosis not present

## 2023-04-21 DIAGNOSIS — S31115A Laceration without foreign body of abdominal wall, periumbilic region without penetration into peritoneal cavity, initial encounter: Secondary | ICD-10-CM | POA: Diagnosis not present

## 2023-04-21 DIAGNOSIS — I509 Heart failure, unspecified: Secondary | ICD-10-CM | POA: Diagnosis not present

## 2023-04-21 DIAGNOSIS — F05 Delirium due to known physiological condition: Secondary | ICD-10-CM | POA: Diagnosis not present

## 2023-04-21 DIAGNOSIS — I5033 Acute on chronic diastolic (congestive) heart failure: Secondary | ICD-10-CM | POA: Diagnosis not present

## 2023-04-21 DIAGNOSIS — R5381 Other malaise: Secondary | ICD-10-CM | POA: Diagnosis not present

## 2023-04-21 DIAGNOSIS — M6281 Muscle weakness (generalized): Secondary | ICD-10-CM | POA: Diagnosis not present

## 2023-04-21 DIAGNOSIS — J90 Pleural effusion, not elsewhere classified: Secondary | ICD-10-CM | POA: Diagnosis not present

## 2023-04-21 DIAGNOSIS — N189 Chronic kidney disease, unspecified: Secondary | ICD-10-CM | POA: Diagnosis not present

## 2023-04-21 DIAGNOSIS — I4891 Unspecified atrial fibrillation: Secondary | ICD-10-CM | POA: Diagnosis not present

## 2023-04-21 DIAGNOSIS — D631 Anemia in chronic kidney disease: Secondary | ICD-10-CM | POA: Diagnosis not present

## 2023-04-21 DIAGNOSIS — I517 Cardiomegaly: Secondary | ICD-10-CM | POA: Diagnosis not present

## 2023-04-21 DIAGNOSIS — J449 Chronic obstructive pulmonary disease, unspecified: Secondary | ICD-10-CM | POA: Diagnosis not present

## 2023-04-21 DIAGNOSIS — J9601 Acute respiratory failure with hypoxia: Secondary | ICD-10-CM | POA: Diagnosis not present

## 2023-04-21 DIAGNOSIS — Z6841 Body Mass Index (BMI) 40.0 and over, adult: Secondary | ICD-10-CM | POA: Diagnosis not present

## 2023-04-21 DIAGNOSIS — A499 Bacterial infection, unspecified: Secondary | ICD-10-CM | POA: Diagnosis not present

## 2023-04-21 DIAGNOSIS — E119 Type 2 diabetes mellitus without complications: Secondary | ICD-10-CM | POA: Diagnosis not present

## 2023-04-21 DIAGNOSIS — I5043 Acute on chronic combined systolic (congestive) and diastolic (congestive) heart failure: Secondary | ICD-10-CM | POA: Diagnosis not present

## 2023-04-21 DIAGNOSIS — J441 Chronic obstructive pulmonary disease with (acute) exacerbation: Secondary | ICD-10-CM | POA: Diagnosis not present

## 2023-04-21 DIAGNOSIS — I10 Essential (primary) hypertension: Secondary | ICD-10-CM | POA: Diagnosis not present

## 2023-04-21 DIAGNOSIS — R0902 Hypoxemia: Secondary | ICD-10-CM | POA: Diagnosis not present

## 2023-04-21 DIAGNOSIS — I4892 Unspecified atrial flutter: Secondary | ICD-10-CM | POA: Diagnosis not present

## 2023-04-21 DIAGNOSIS — I5023 Acute on chronic systolic (congestive) heart failure: Secondary | ICD-10-CM | POA: Diagnosis not present

## 2023-04-21 DIAGNOSIS — R279 Unspecified lack of coordination: Secondary | ICD-10-CM | POA: Diagnosis not present

## 2023-04-21 DIAGNOSIS — N184 Chronic kidney disease, stage 4 (severe): Secondary | ICD-10-CM | POA: Diagnosis not present

## 2023-04-21 DIAGNOSIS — R0602 Shortness of breath: Secondary | ICD-10-CM | POA: Diagnosis not present

## 2023-04-21 DIAGNOSIS — I2510 Atherosclerotic heart disease of native coronary artery without angina pectoris: Secondary | ICD-10-CM | POA: Diagnosis not present

## 2023-04-24 DIAGNOSIS — R5381 Other malaise: Secondary | ICD-10-CM | POA: Diagnosis not present

## 2023-04-25 DIAGNOSIS — R5381 Other malaise: Secondary | ICD-10-CM | POA: Diagnosis not present

## 2023-04-26 DIAGNOSIS — R5381 Other malaise: Secondary | ICD-10-CM | POA: Diagnosis not present

## 2023-04-28 DIAGNOSIS — E119 Type 2 diabetes mellitus without complications: Secondary | ICD-10-CM | POA: Diagnosis not present

## 2023-04-28 DIAGNOSIS — Z6841 Body Mass Index (BMI) 40.0 and over, adult: Secondary | ICD-10-CM | POA: Diagnosis not present

## 2023-04-28 DIAGNOSIS — S31115A Laceration without foreign body of abdominal wall, periumbilic region without penetration into peritoneal cavity, initial encounter: Secondary | ICD-10-CM | POA: Diagnosis not present

## 2023-04-28 DIAGNOSIS — R5381 Other malaise: Secondary | ICD-10-CM | POA: Diagnosis not present

## 2023-04-28 DIAGNOSIS — J449 Chronic obstructive pulmonary disease, unspecified: Secondary | ICD-10-CM | POA: Diagnosis not present

## 2023-04-29 IMAGING — CR DG CHEST 2V
1 series · 2 of 2 positions shown · non-contrast
Comparison: 08/07/2021

CLINICAL DATA: Chest pain and shortness of breath.

EXAM:
CHEST - 2 VIEW

[Series 1: dg chest 2 view · 0.14mm/px · 2 of 2 slices shown]
[im 1/2]
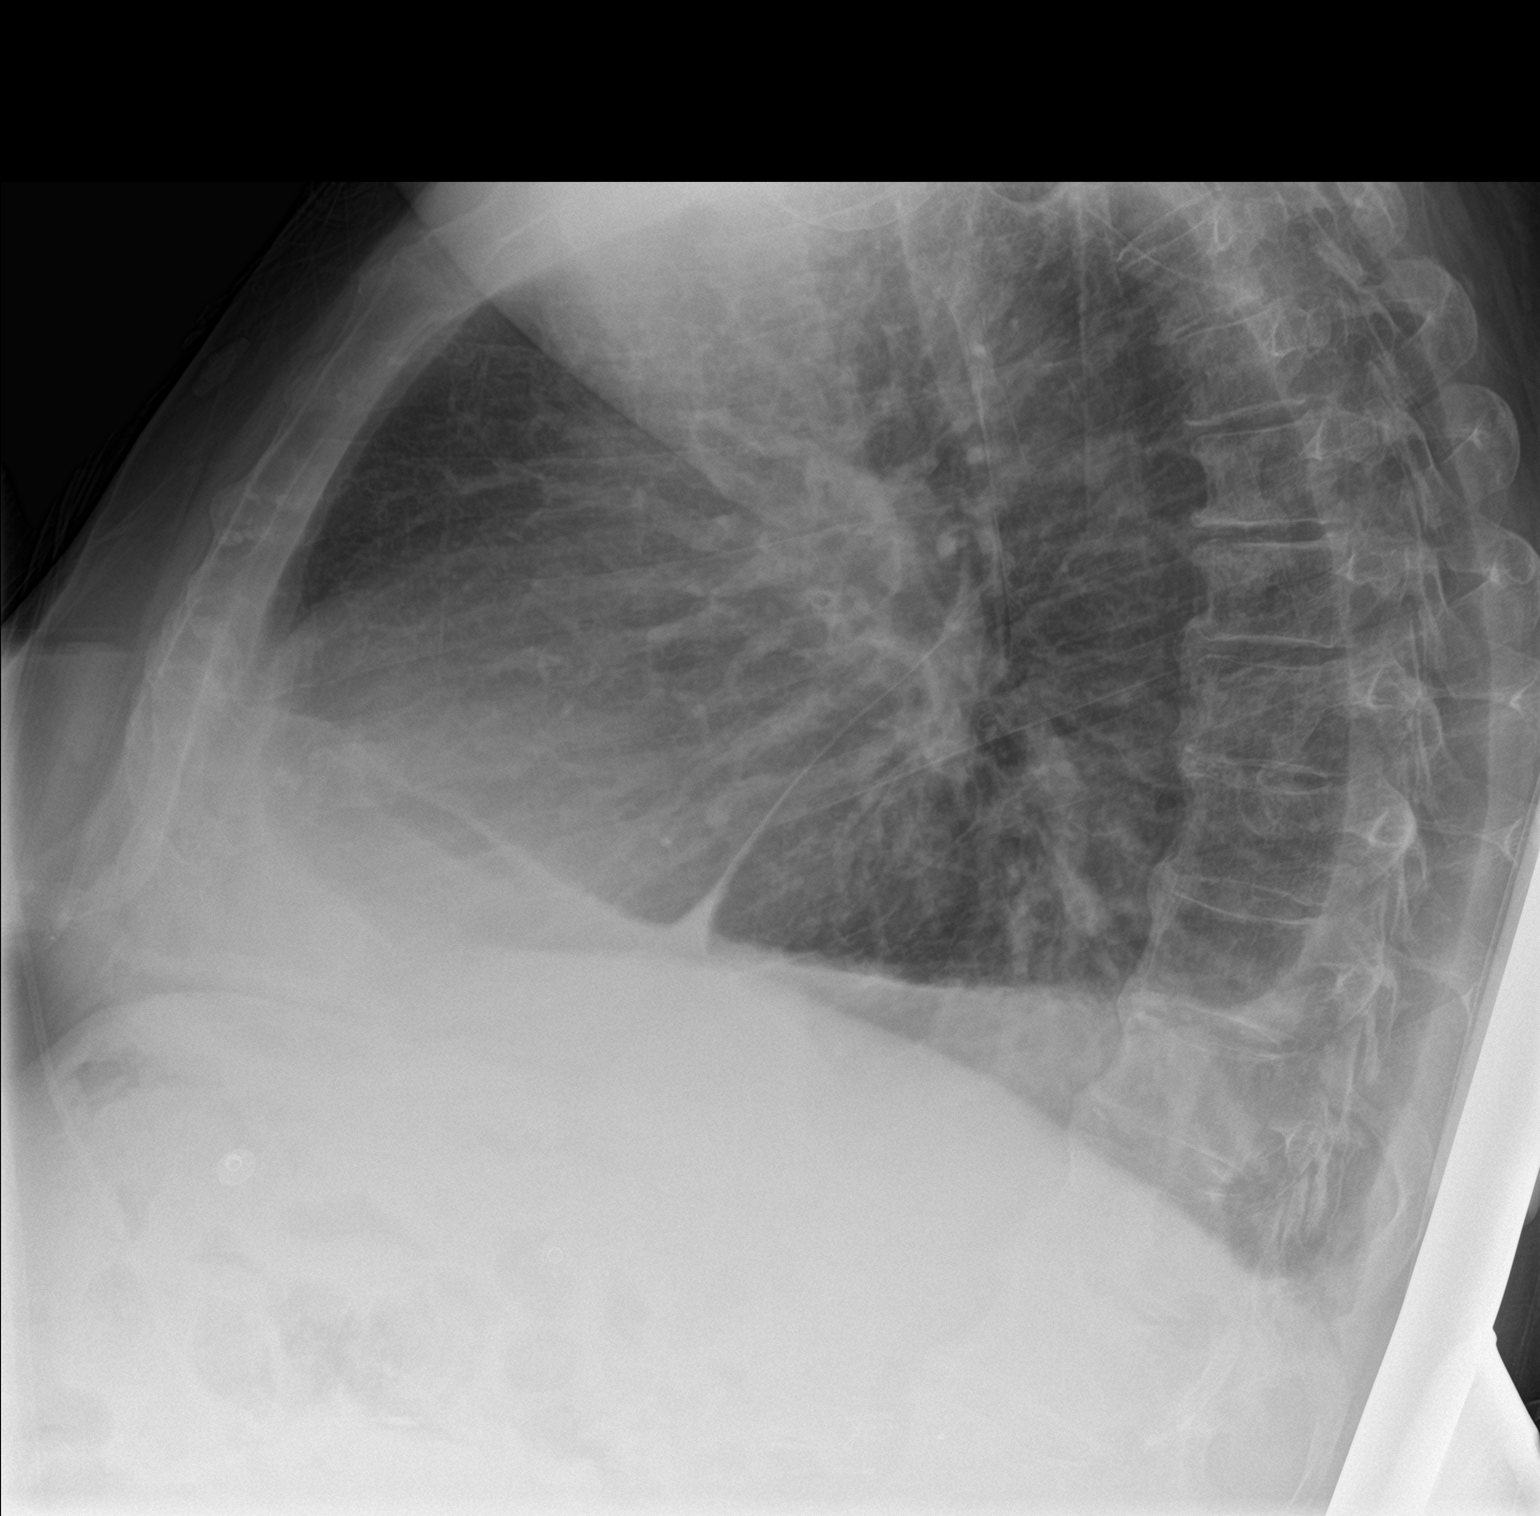
[im 2/2]
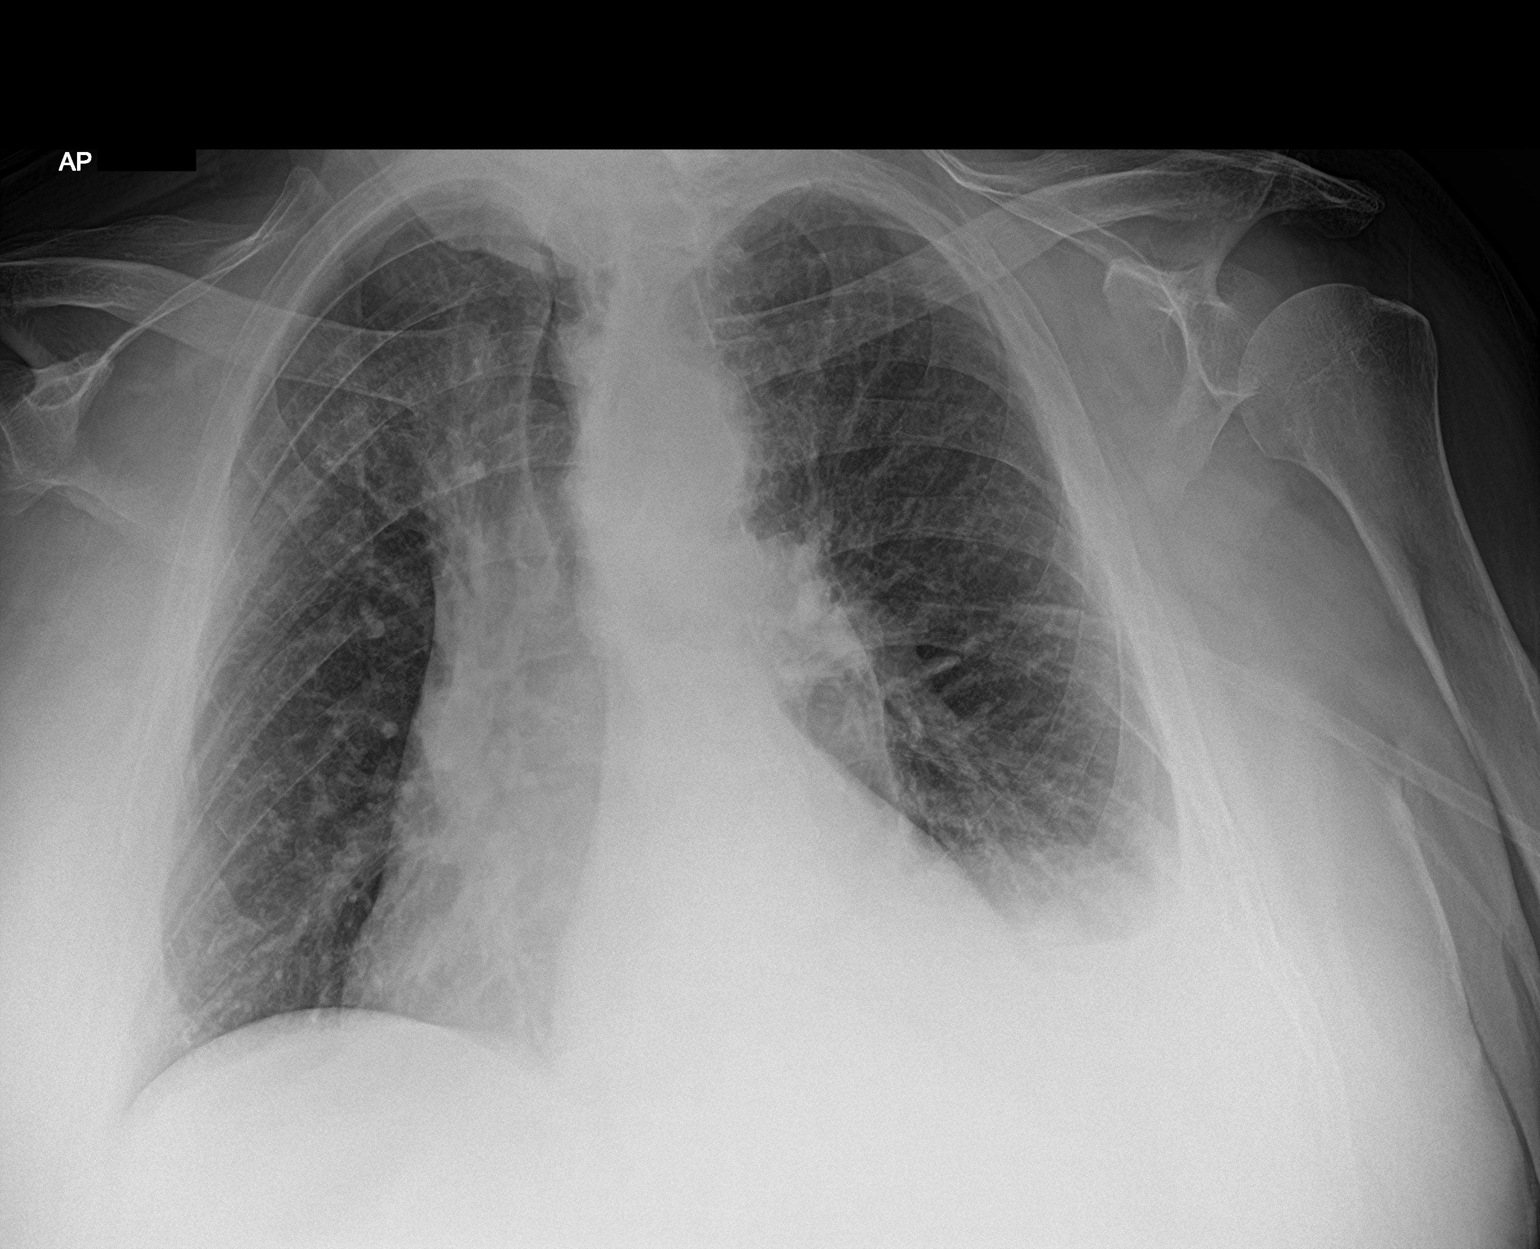

[2 of 2 positions shown; findings below may reference images not displayed]

FINDINGS: Stable top-normal heart size. Stable to slightly smaller left
pleural effusion of overall small volume. Associated left lower lobe
atelectasis. Right pleural effusion has resolved since the prior
x-ray. Probable mild pulmonary interstitial edema. No pneumothorax.
IMPRESSION: Mild pulmonary interstitial edema. Small left pleural effusion
remains which appears slightly smaller compared to the prior chest
x-ray. Right pleural effusion has resolved since the prior chest
x-ray.

## 2023-05-01 ENCOUNTER — Ambulatory Visit: Payer: Medicare PPO | Admitting: Internal Medicine

## 2023-05-01 DIAGNOSIS — R5381 Other malaise: Secondary | ICD-10-CM | POA: Diagnosis not present

## 2023-05-02 DIAGNOSIS — R5381 Other malaise: Secondary | ICD-10-CM | POA: Diagnosis not present

## 2023-05-03 DIAGNOSIS — R5381 Other malaise: Secondary | ICD-10-CM | POA: Diagnosis not present

## 2023-05-05 DIAGNOSIS — Z6841 Body Mass Index (BMI) 40.0 and over, adult: Secondary | ICD-10-CM | POA: Diagnosis not present

## 2023-05-05 DIAGNOSIS — J449 Chronic obstructive pulmonary disease, unspecified: Secondary | ICD-10-CM | POA: Diagnosis not present

## 2023-05-05 DIAGNOSIS — S31115A Laceration without foreign body of abdominal wall, periumbilic region without penetration into peritoneal cavity, initial encounter: Secondary | ICD-10-CM | POA: Diagnosis not present

## 2023-05-05 DIAGNOSIS — E119 Type 2 diabetes mellitus without complications: Secondary | ICD-10-CM | POA: Diagnosis not present

## 2023-05-05 DIAGNOSIS — R5381 Other malaise: Secondary | ICD-10-CM | POA: Diagnosis not present

## 2023-05-06 IMAGING — CT CT CHEST W/O CM
2 of 4 series · 15 of 36 positions shown, 18 images · non-contrast
Comparison: Chest x-ray earlier today. Prior CTA of the chest on
01/28/2021

CLINICAL DATA: Left-sided chest pain and shortness of breath.



[Series 2: lungs · axial · 0.90mm/px · z∈[-503,-239]mm · 12 of 157 slices shown, 15 images]
[im 13/157  mediastinal]
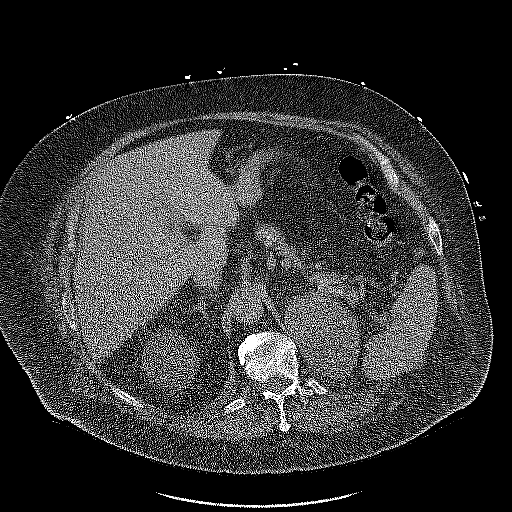
[im 13/157  lung]
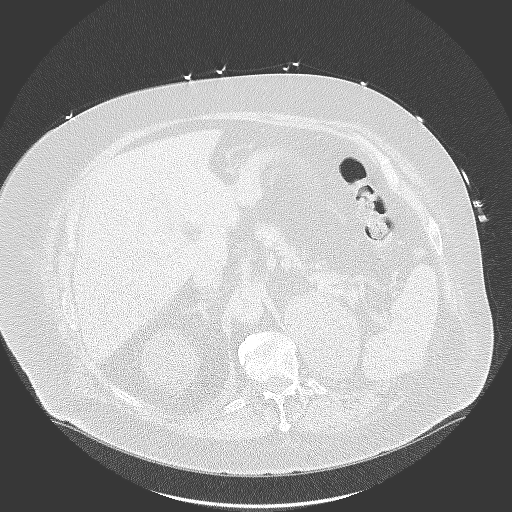
[im 25/157  lung]
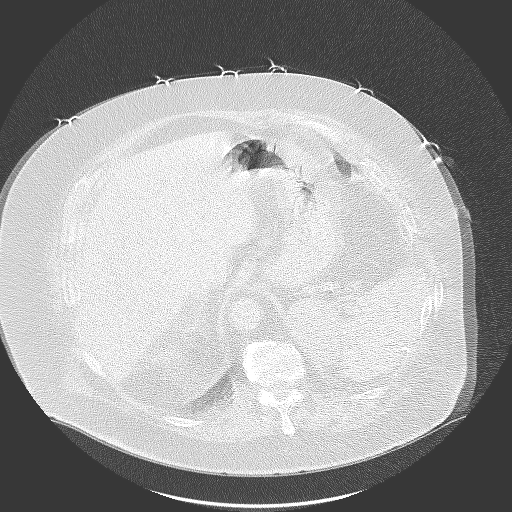
[im 37/157  lung]
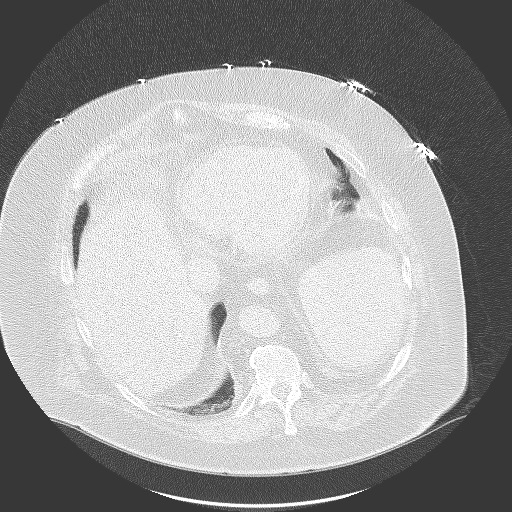
[im 49/157  lung]
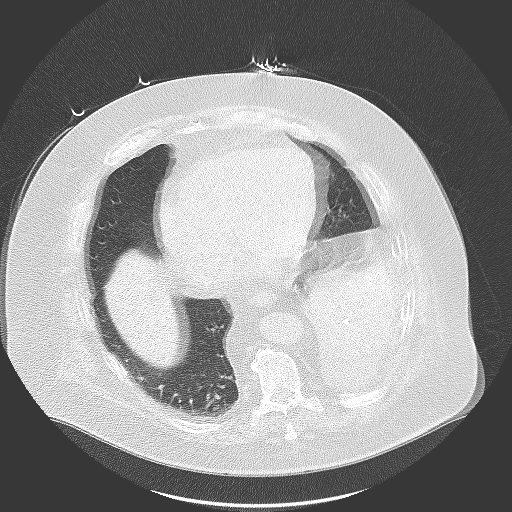
[im 61/157  mediastinal]
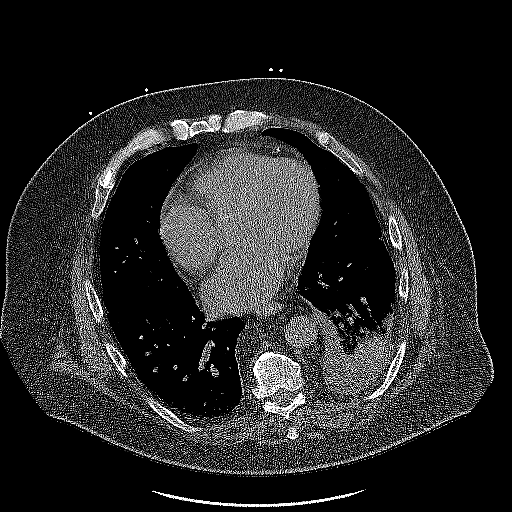
[im 61/157  lung]
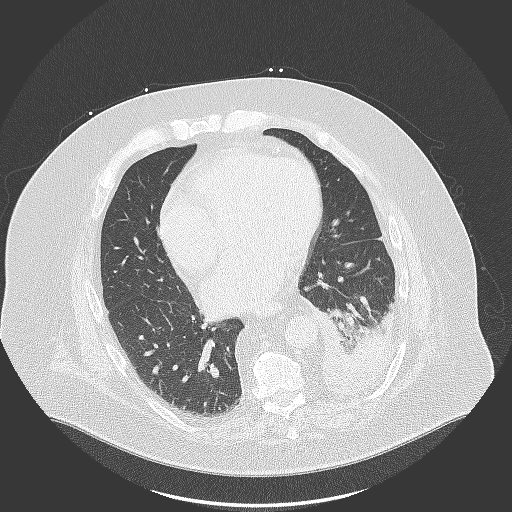
[im 73/157  lung]
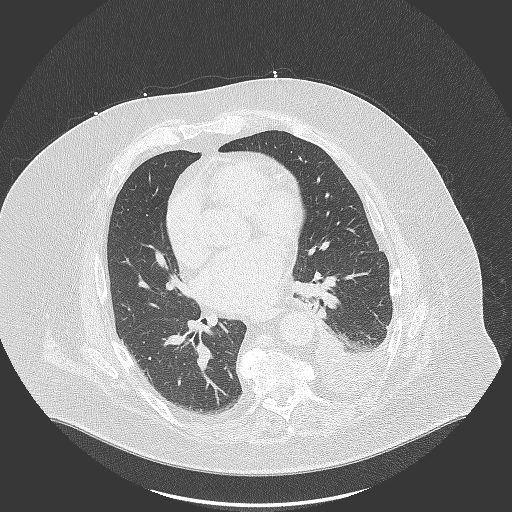
[im 85/157  lung]
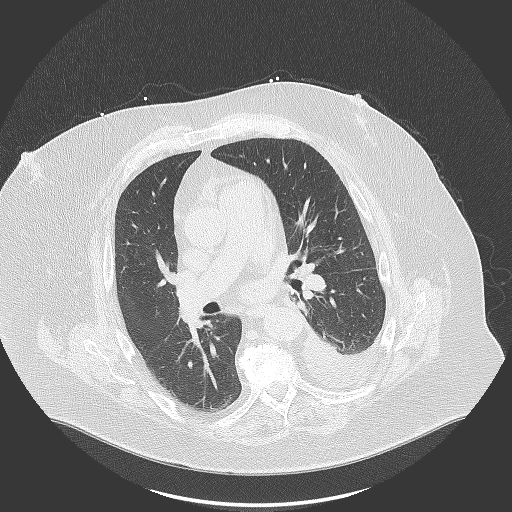
[im 97/157  lung]
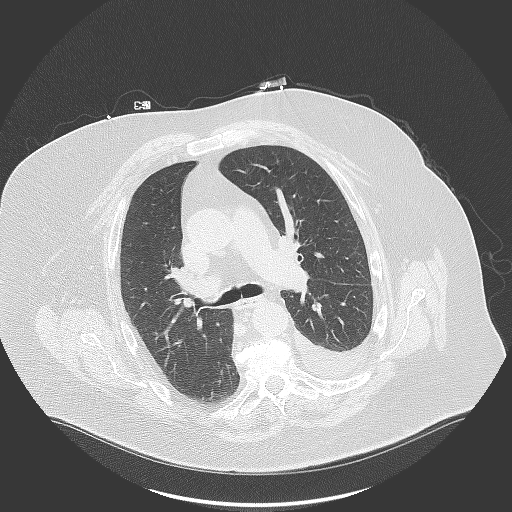
[im 109/157  mediastinal]
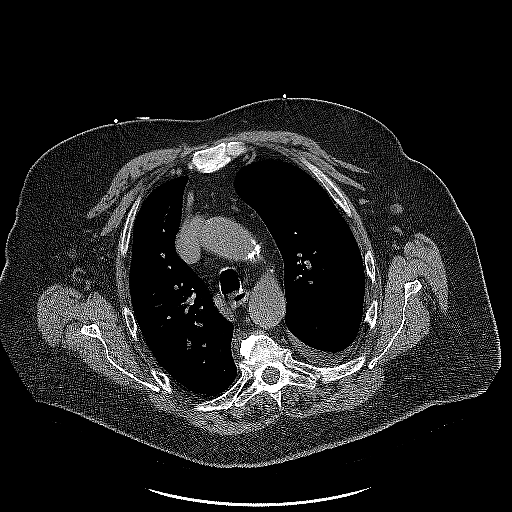
[im 109/157  lung]
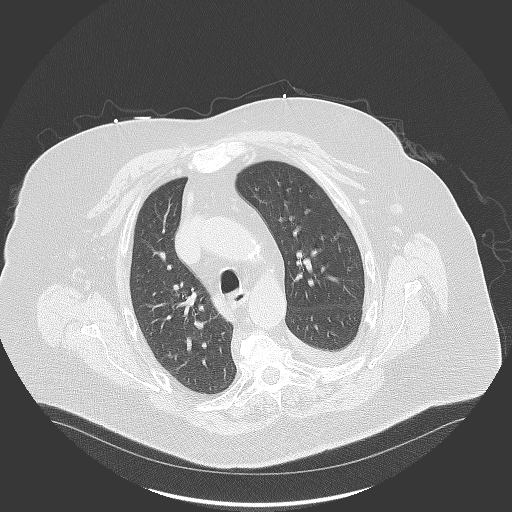
[im 121/157  lung]
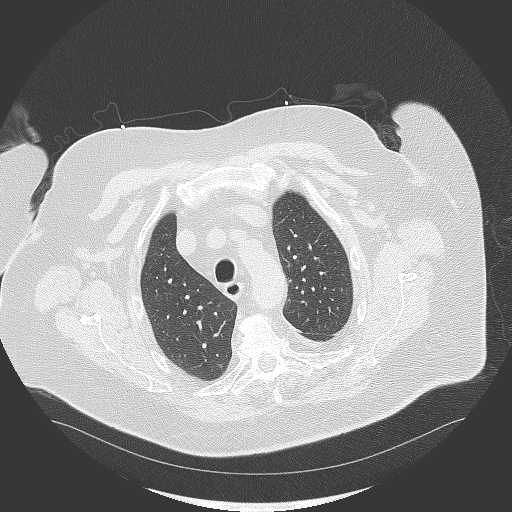
[im 133/157  lung]
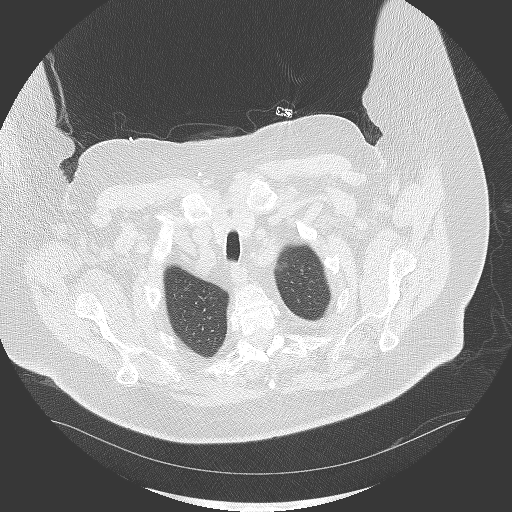
[im 145/157  lung]
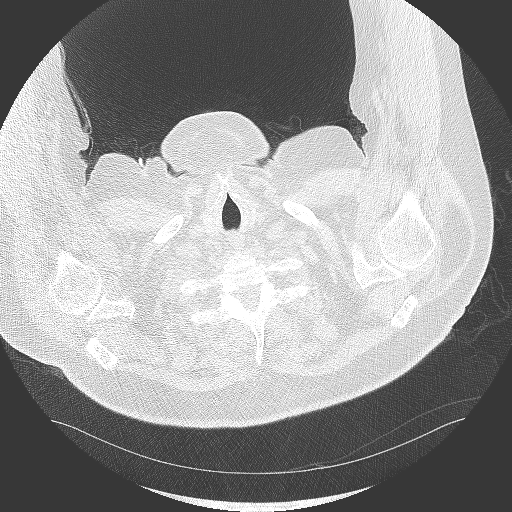

[Series 5: coronal · coronal · 0.62mm/px · 3 of 189 slices shown]
[im 38/189  lung]
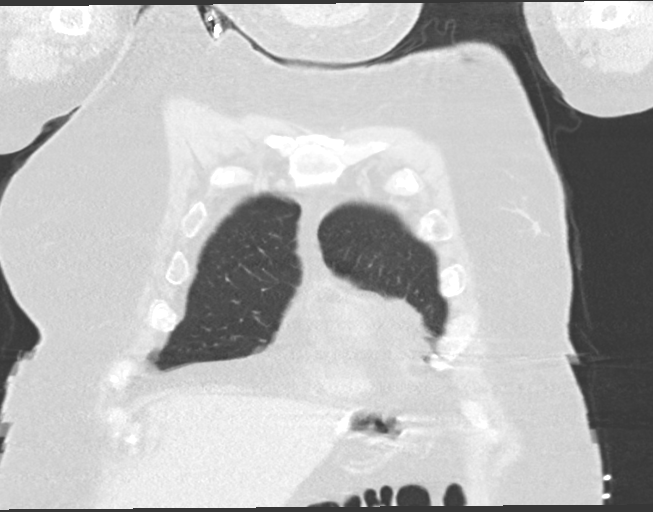
[im 76/189  lung]
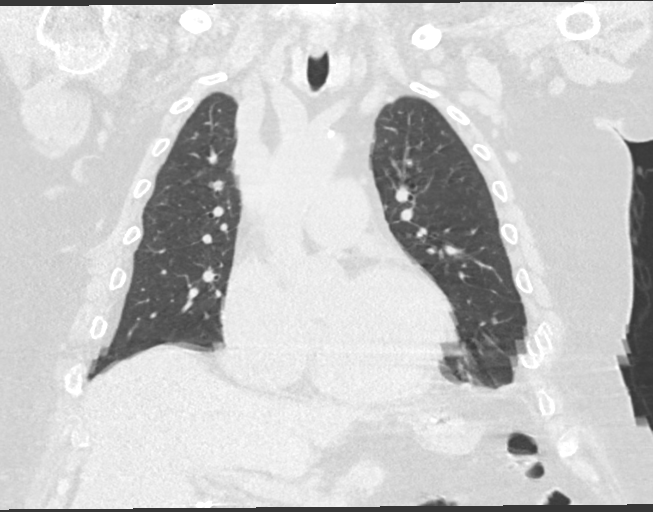
[im 113/189  lung]
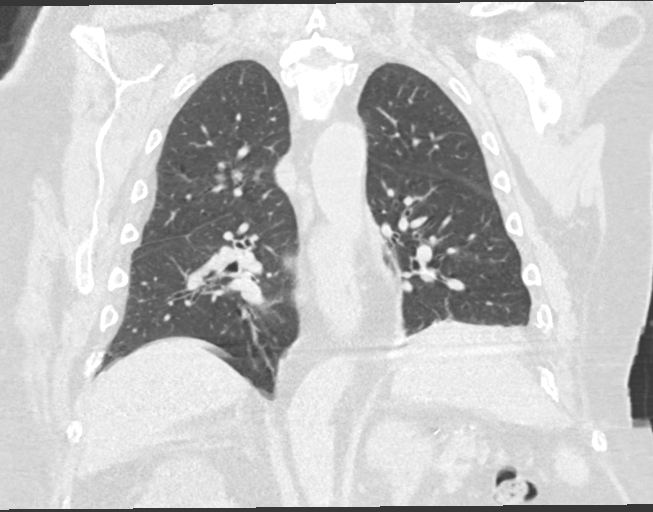

[15 of 36 positions shown; findings below may reference images not displayed]

FINDINGS: Cardiovascular: The heart size is at the upper limits of normal.
Calcified coronary artery plaque identified. No pericardial fluid.
Central pulmonary arteries are dilated with the main pulmonary
artery measuring 3.6 cm.

Mediastinum/Nodes: No enlarged mediastinal or axillary lymph nodes.
Thyroid gland, trachea, and esophagus demonstrate no significant
findings.

Lungs/Pleura: Small left pleural effusion and associated left lower
lobe atelectasis versus pneumonia. No pneumothorax or overt
pulmonary edema.

Upper Abdomen: Calcified granulomata in the spleen. Bilateral upper
pole renal cysts again visualized which have a stable appearance.

Musculoskeletal: No chest wall mass or suspicious bone lesions
identified.
IMPRESSION: 1. Left lower lobe atelectasis versus pneumonia with small left
pleural effusion.
2. Coronary atherosclerosis.
3. Dilated central pulmonary arteries are consistent with pulmonary
hypertension.
4. Calcified granulomata of the spleen.

## 2023-05-06 IMAGING — CR DG CHEST 2V
2 series · 2 of 2 positions shown · non-contrast
Comparison: 09/16/2021

CLINICAL DATA: Chest pain. Swollen hand from stitches

EXAM:
CHEST - 2 VIEW

[chest pa]
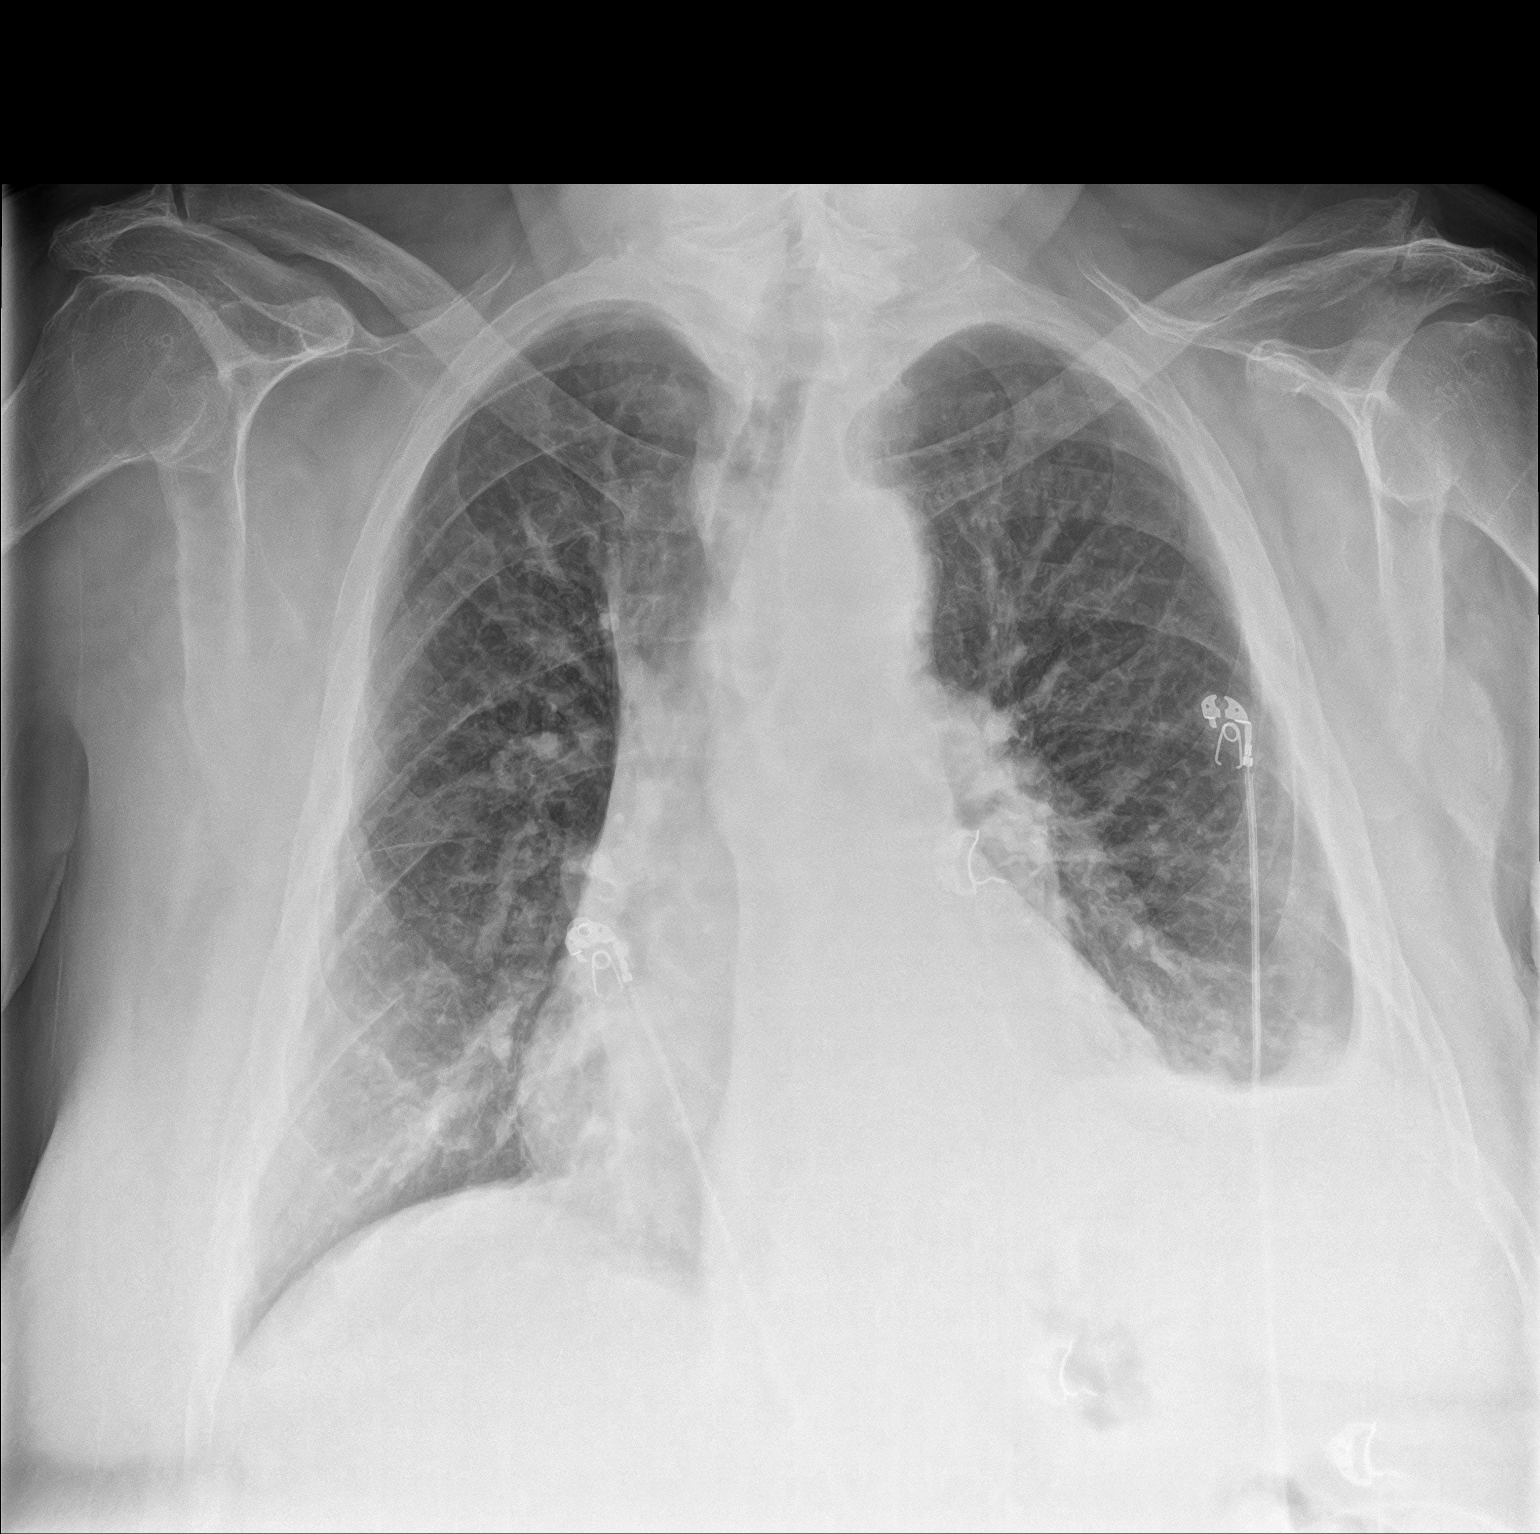

[chest lat]
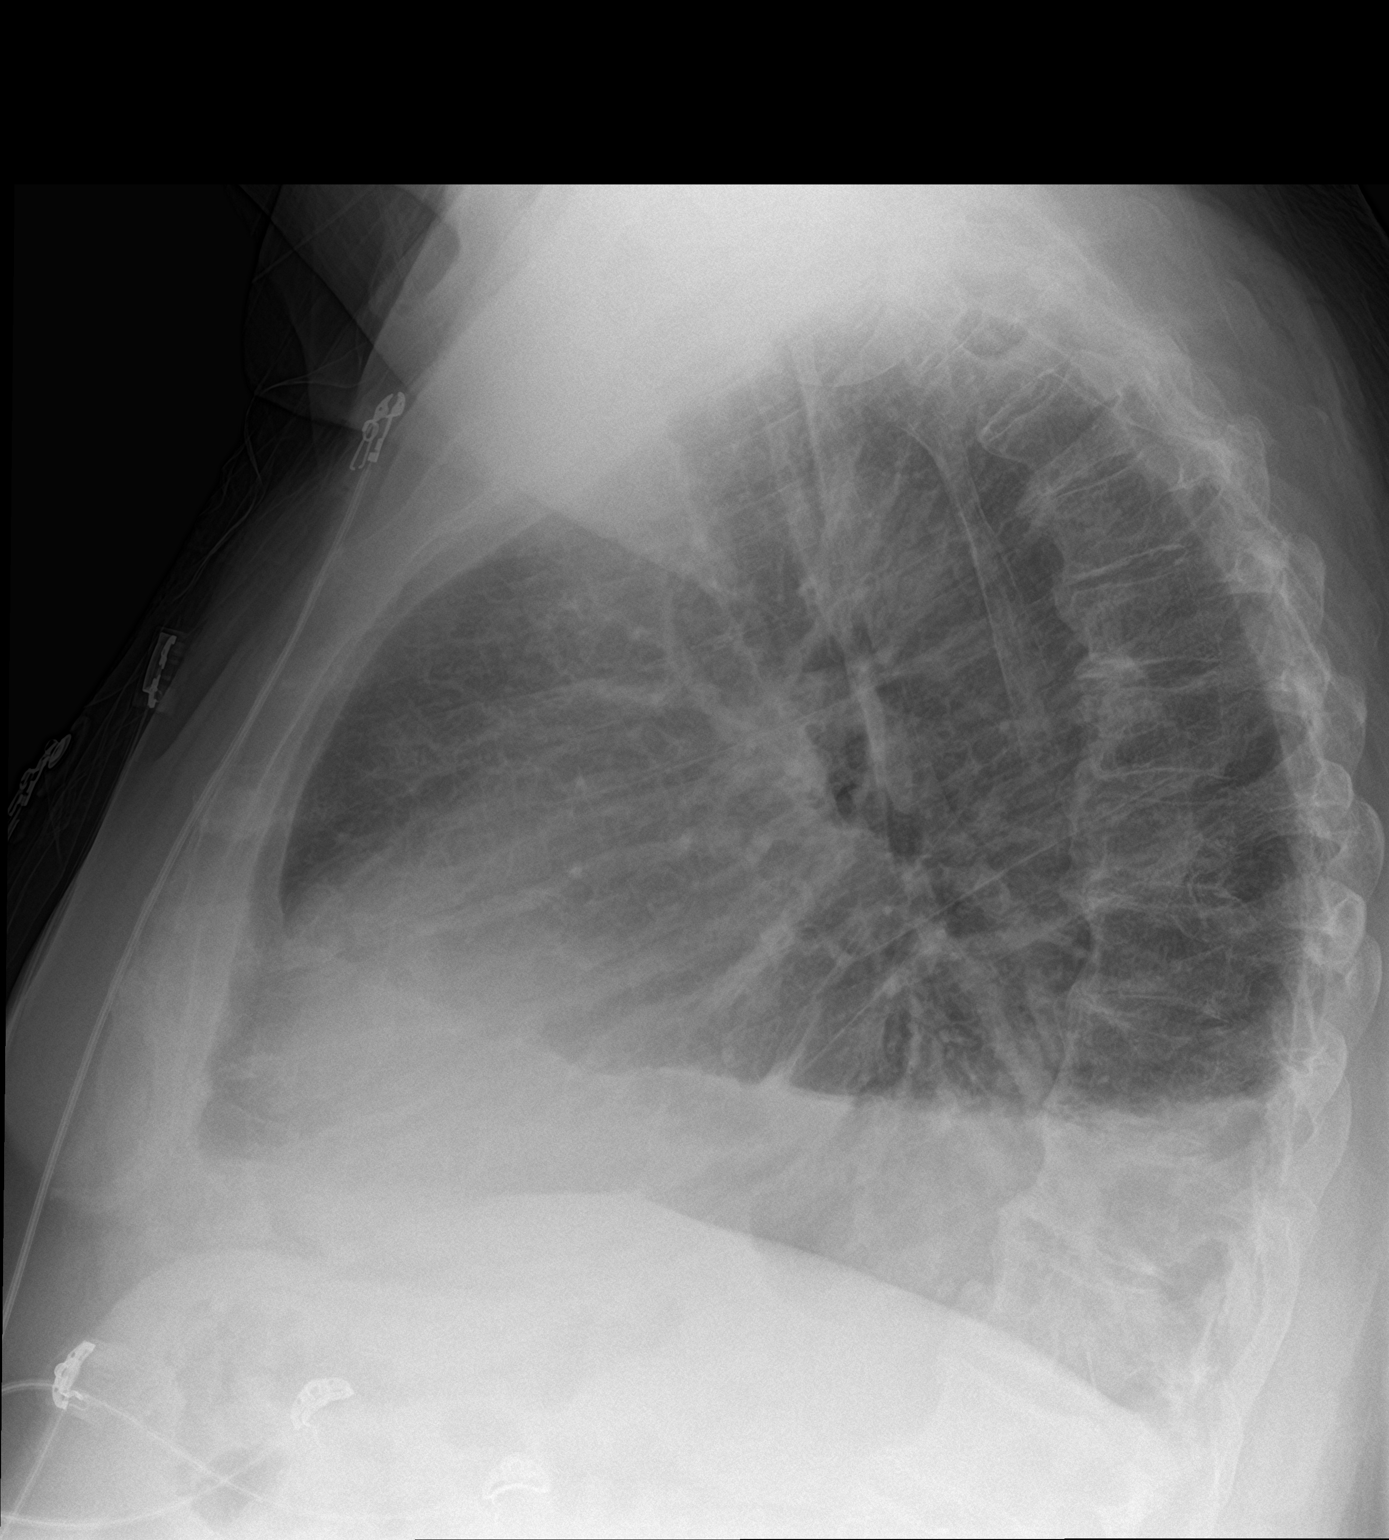

[2 of 2 positions shown; findings below may reference images not displayed]

FINDINGS: Stable small to moderate left pleural effusion with adjacent
atelectasis/consolidation at the left lung base. Right lung clear.

Borderline cardiomegaly.

No pneumothorax.

Anterior vertebral endplate spurring at multiple levels in the mid
and lower thoracic spine.
IMPRESSION: 1. Stable left pleural effusion with adjacent
consolidation/atelectasis at the lung base.
2. Borderline cardiomegaly, stable.

## 2023-05-06 IMAGING — CR DG HAND COMPLETE 3+V*L*
3 series · 3 of 3 positions shown · non-contrast
Comparison: None.

CLINICAL DATA: Swelling

EXAM:
LEFT HAND - COMPLETE 3+ VIEW

[hand ap]
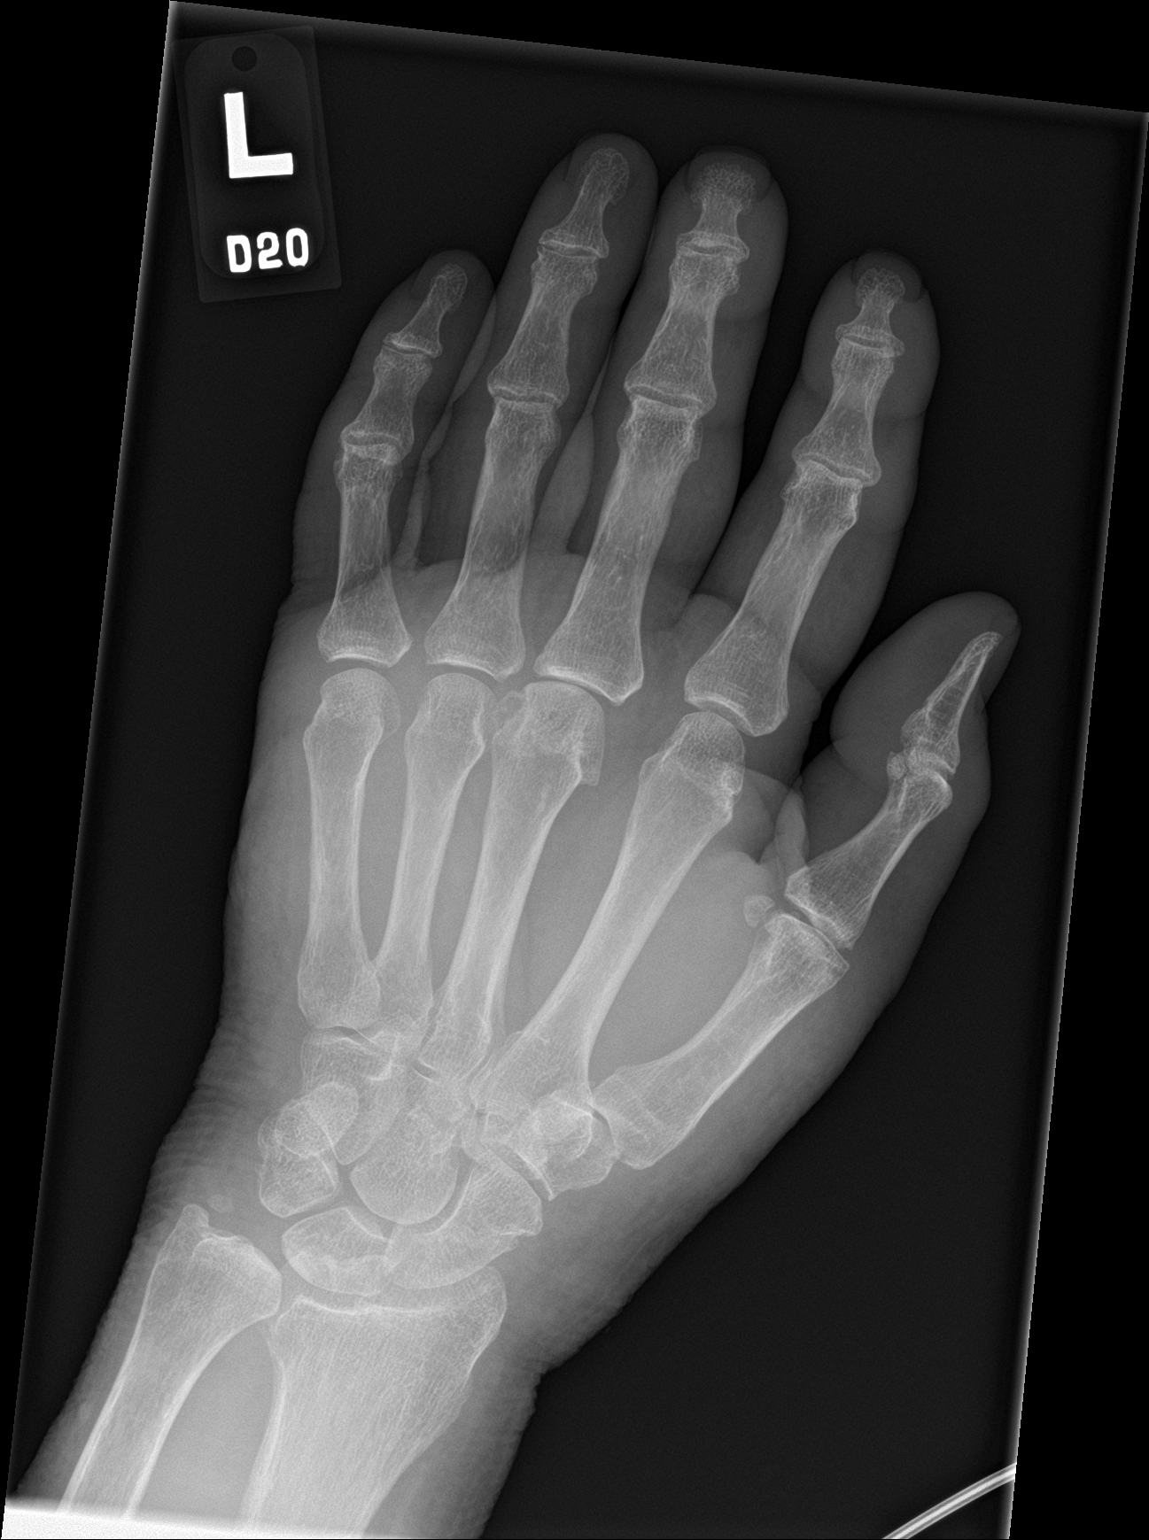

[hand obl]
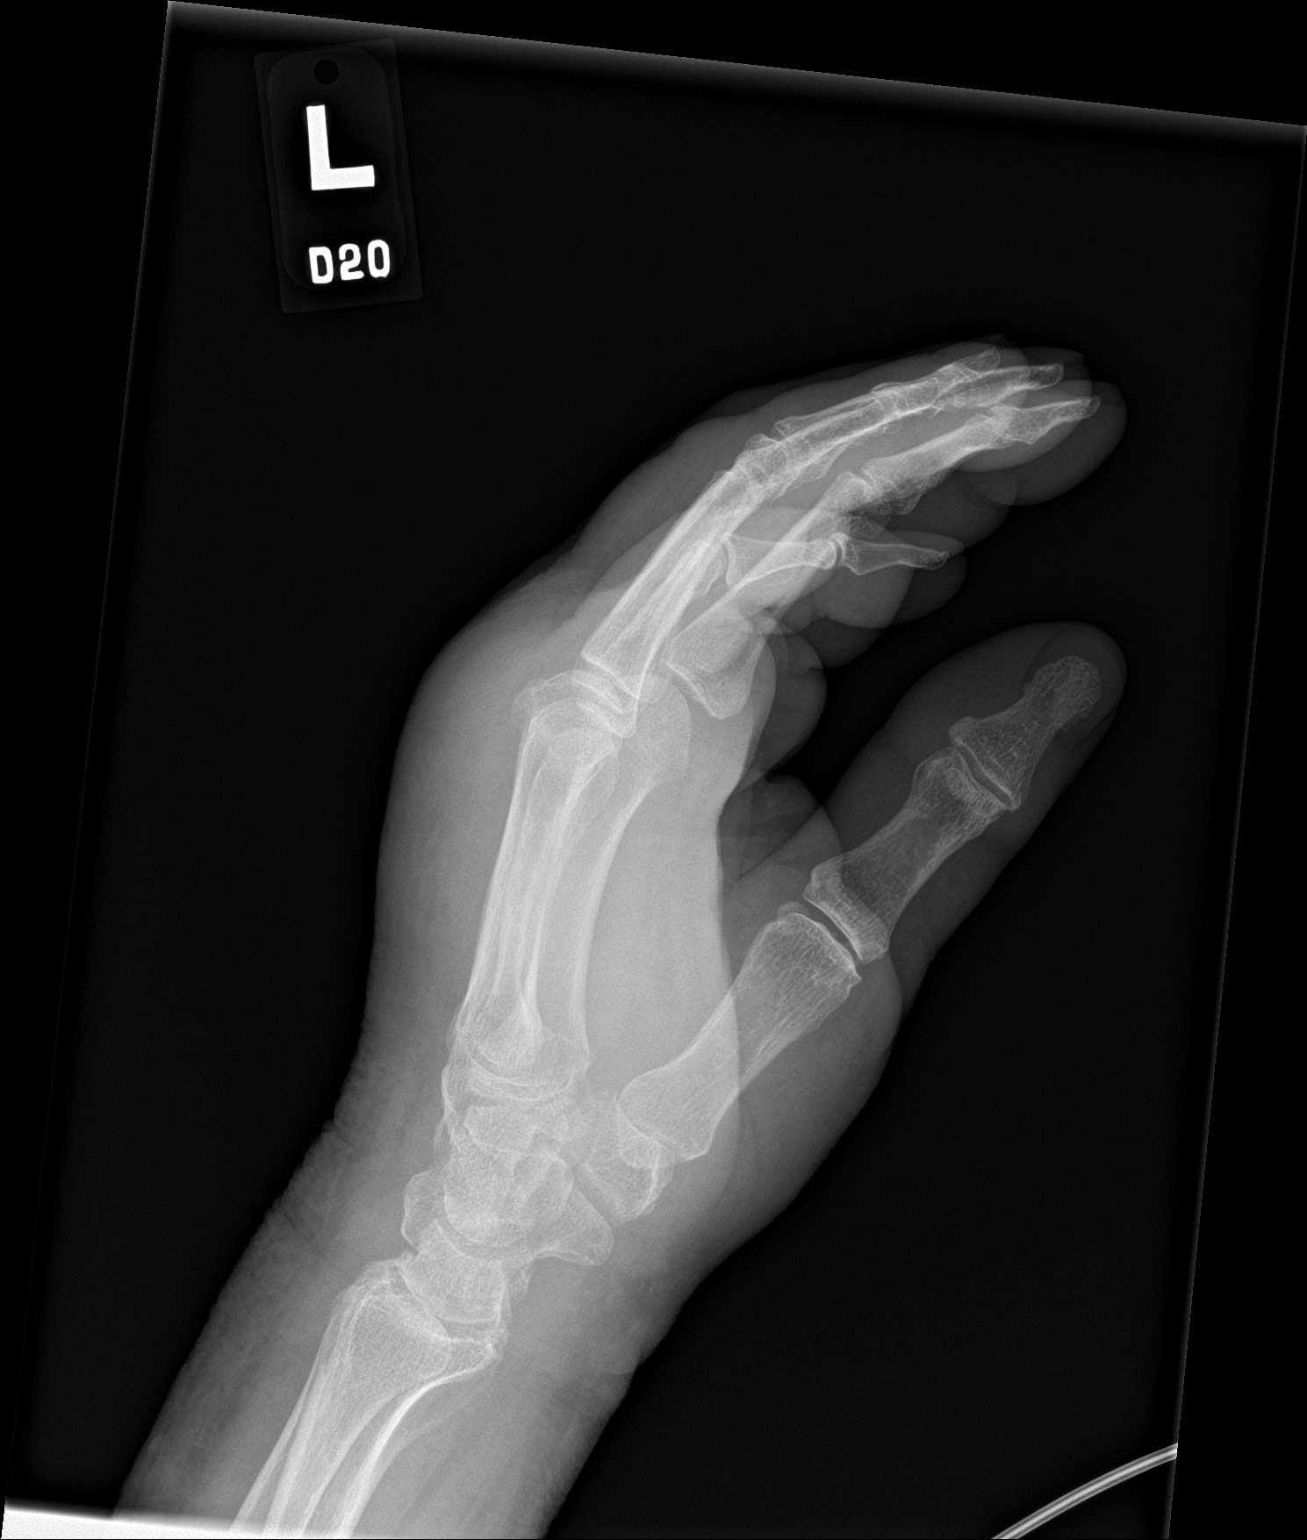

[hand lat]
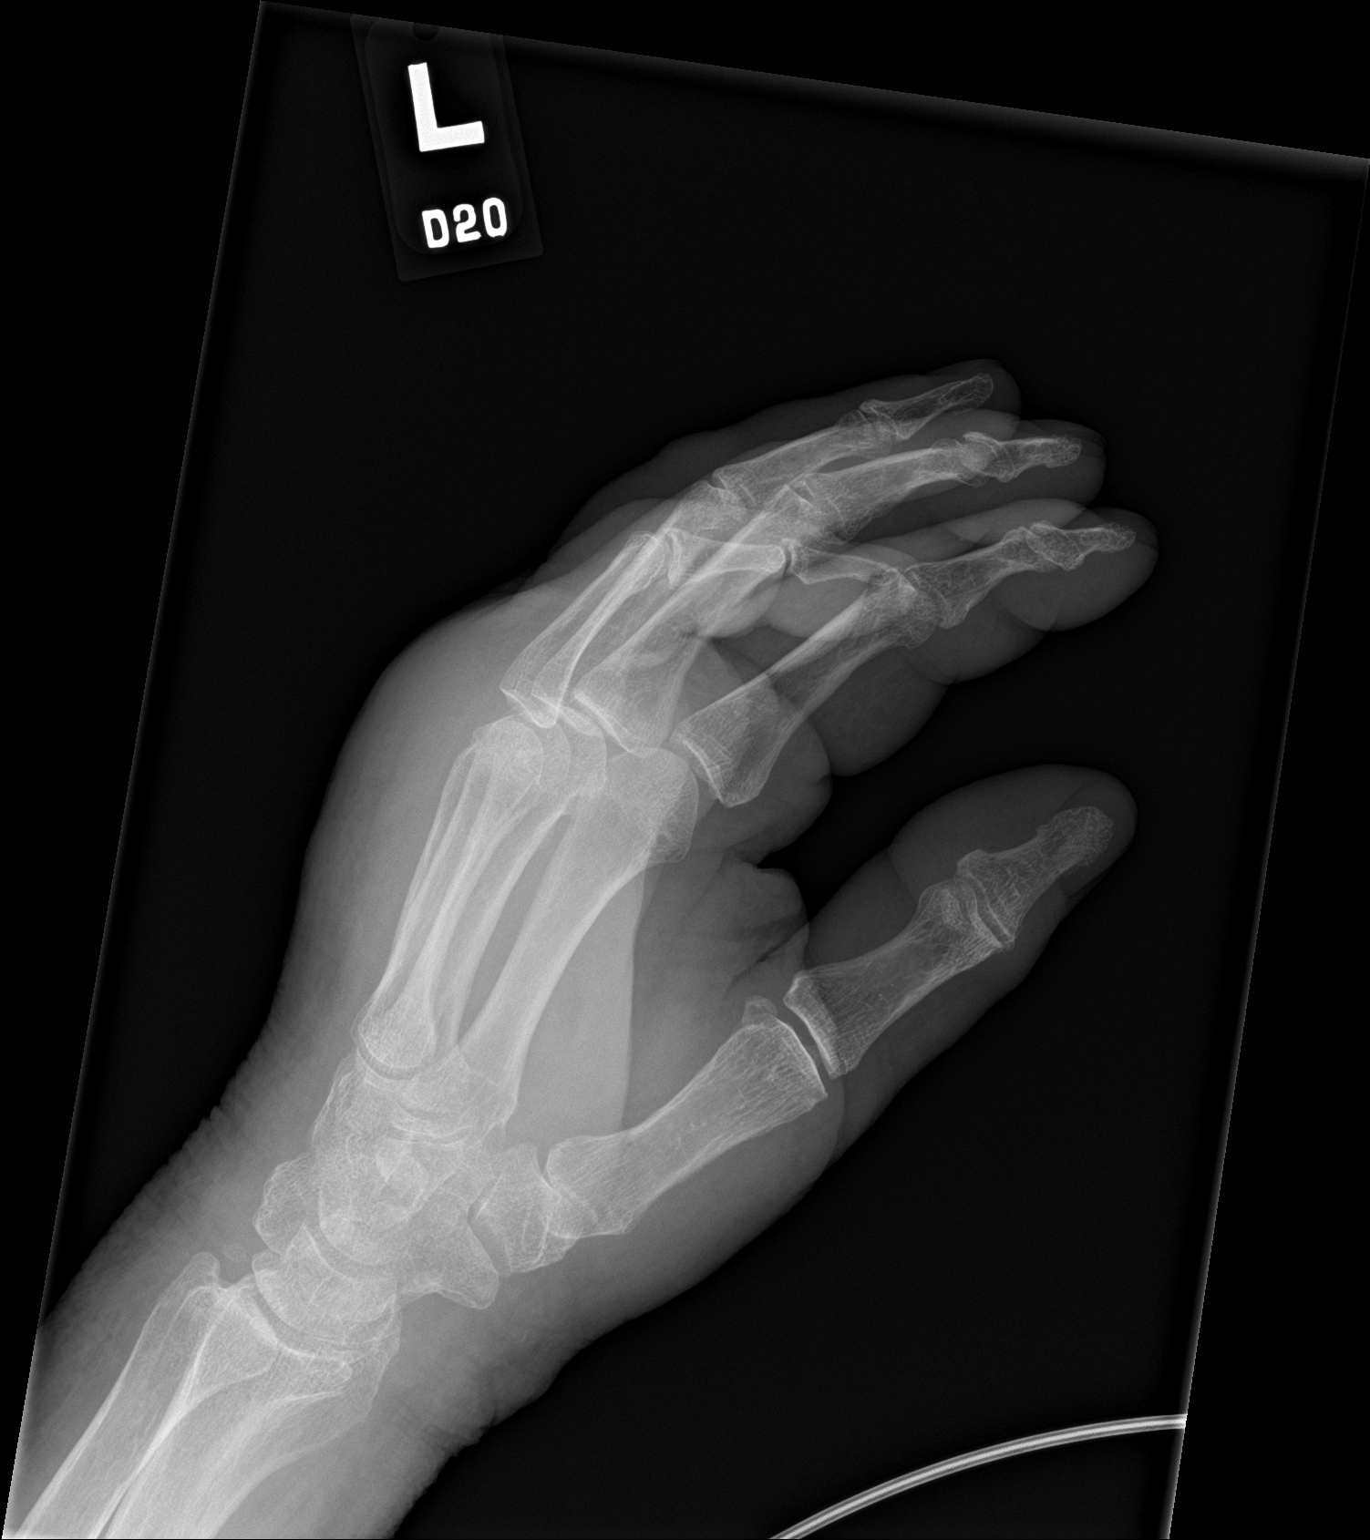

[3 of 3 positions shown; findings below may reference images not displayed]

FINDINGS: Mild diffuse osteopenia. No acute fracture or dislocation. Probable
old fracture deformity of the third metacarpal head. Dorsal soft
tissue swelling over the metacarpal heads. No radiodense foreign
body or subcutaneous gas.
IMPRESSION: 1. No acute bone abnormality.
2. Dorsal soft tissue swelling

## 2023-05-08 DIAGNOSIS — R279 Unspecified lack of coordination: Secondary | ICD-10-CM | POA: Diagnosis not present

## 2023-05-08 DIAGNOSIS — I517 Cardiomegaly: Secondary | ICD-10-CM | POA: Diagnosis not present

## 2023-05-08 DIAGNOSIS — J90 Pleural effusion, not elsewhere classified: Secondary | ICD-10-CM | POA: Diagnosis not present

## 2023-05-08 DIAGNOSIS — E119 Type 2 diabetes mellitus without complications: Secondary | ICD-10-CM | POA: Diagnosis not present

## 2023-05-08 DIAGNOSIS — R918 Other nonspecific abnormal finding of lung field: Secondary | ICD-10-CM | POA: Diagnosis not present

## 2023-05-08 DIAGNOSIS — F05 Delirium due to known physiological condition: Secondary | ICD-10-CM | POA: Diagnosis not present

## 2023-05-08 DIAGNOSIS — Z741 Need for assistance with personal care: Secondary | ICD-10-CM | POA: Diagnosis not present

## 2023-05-08 DIAGNOSIS — N184 Chronic kidney disease, stage 4 (severe): Secondary | ICD-10-CM | POA: Diagnosis not present

## 2023-05-08 DIAGNOSIS — J9811 Atelectasis: Secondary | ICD-10-CM | POA: Diagnosis not present

## 2023-05-08 DIAGNOSIS — A499 Bacterial infection, unspecified: Secondary | ICD-10-CM | POA: Diagnosis not present

## 2023-05-08 DIAGNOSIS — M47814 Spondylosis without myelopathy or radiculopathy, thoracic region: Secondary | ICD-10-CM | POA: Diagnosis not present

## 2023-05-08 DIAGNOSIS — R41841 Cognitive communication deficit: Secondary | ICD-10-CM | POA: Diagnosis not present

## 2023-05-08 DIAGNOSIS — R441 Visual hallucinations: Secondary | ICD-10-CM | POA: Diagnosis not present

## 2023-05-08 DIAGNOSIS — D631 Anemia in chronic kidney disease: Secondary | ICD-10-CM | POA: Diagnosis not present

## 2023-05-08 DIAGNOSIS — I5033 Acute on chronic diastolic (congestive) heart failure: Secondary | ICD-10-CM | POA: Diagnosis not present

## 2023-05-08 DIAGNOSIS — F99 Mental disorder, not otherwise specified: Secondary | ICD-10-CM | POA: Diagnosis not present

## 2023-05-08 DIAGNOSIS — I509 Heart failure, unspecified: Secondary | ICD-10-CM | POA: Diagnosis not present

## 2023-05-08 DIAGNOSIS — R0602 Shortness of breath: Secondary | ICD-10-CM | POA: Diagnosis not present

## 2023-05-08 DIAGNOSIS — I5020 Unspecified systolic (congestive) heart failure: Secondary | ICD-10-CM | POA: Diagnosis not present

## 2023-05-08 DIAGNOSIS — I130 Hypertensive heart and chronic kidney disease with heart failure and stage 1 through stage 4 chronic kidney disease, or unspecified chronic kidney disease: Secondary | ICD-10-CM | POA: Diagnosis not present

## 2023-05-08 DIAGNOSIS — E1122 Type 2 diabetes mellitus with diabetic chronic kidney disease: Secondary | ICD-10-CM | POA: Diagnosis not present

## 2023-05-08 DIAGNOSIS — G4733 Obstructive sleep apnea (adult) (pediatric): Secondary | ICD-10-CM | POA: Diagnosis not present

## 2023-05-08 DIAGNOSIS — R2681 Unsteadiness on feet: Secondary | ICD-10-CM | POA: Diagnosis not present

## 2023-05-08 DIAGNOSIS — J449 Chronic obstructive pulmonary disease, unspecified: Secondary | ICD-10-CM | POA: Diagnosis not present

## 2023-05-08 DIAGNOSIS — J811 Chronic pulmonary edema: Secondary | ICD-10-CM | POA: Diagnosis not present

## 2023-05-08 DIAGNOSIS — R001 Bradycardia, unspecified: Secondary | ICD-10-CM | POA: Diagnosis not present

## 2023-05-08 DIAGNOSIS — I2510 Atherosclerotic heart disease of native coronary artery without angina pectoris: Secondary | ICD-10-CM | POA: Diagnosis not present

## 2023-05-08 DIAGNOSIS — I5023 Acute on chronic systolic (congestive) heart failure: Secondary | ICD-10-CM | POA: Diagnosis not present

## 2023-05-08 DIAGNOSIS — I4819 Other persistent atrial fibrillation: Secondary | ICD-10-CM | POA: Diagnosis not present

## 2023-05-08 DIAGNOSIS — R0902 Hypoxemia: Secondary | ICD-10-CM | POA: Diagnosis not present

## 2023-05-08 DIAGNOSIS — R4182 Altered mental status, unspecified: Secondary | ICD-10-CM | POA: Diagnosis not present

## 2023-05-08 DIAGNOSIS — I483 Typical atrial flutter: Secondary | ICD-10-CM | POA: Diagnosis not present

## 2023-05-08 DIAGNOSIS — I4892 Unspecified atrial flutter: Secondary | ICD-10-CM | POA: Diagnosis not present

## 2023-05-08 DIAGNOSIS — I129 Hypertensive chronic kidney disease with stage 1 through stage 4 chronic kidney disease, or unspecified chronic kidney disease: Secondary | ICD-10-CM | POA: Diagnosis not present

## 2023-05-08 DIAGNOSIS — I77810 Thoracic aortic ectasia: Secondary | ICD-10-CM | POA: Diagnosis not present

## 2023-05-08 DIAGNOSIS — M6281 Muscle weakness (generalized): Secondary | ICD-10-CM | POA: Diagnosis not present

## 2023-05-08 DIAGNOSIS — I10 Essential (primary) hypertension: Secondary | ICD-10-CM | POA: Diagnosis not present

## 2023-05-08 DIAGNOSIS — I4891 Unspecified atrial fibrillation: Secondary | ICD-10-CM | POA: Diagnosis not present

## 2023-05-08 DIAGNOSIS — R5381 Other malaise: Secondary | ICD-10-CM | POA: Diagnosis not present

## 2023-05-08 DIAGNOSIS — E1140 Type 2 diabetes mellitus with diabetic neuropathy, unspecified: Secondary | ICD-10-CM | POA: Diagnosis not present

## 2023-05-08 DIAGNOSIS — I110 Hypertensive heart disease with heart failure: Secondary | ICD-10-CM | POA: Diagnosis not present

## 2023-05-09 DIAGNOSIS — I509 Heart failure, unspecified: Secondary | ICD-10-CM | POA: Diagnosis not present

## 2023-05-09 DIAGNOSIS — I130 Hypertensive heart and chronic kidney disease with heart failure and stage 1 through stage 4 chronic kidney disease, or unspecified chronic kidney disease: Secondary | ICD-10-CM | POA: Diagnosis not present

## 2023-05-09 DIAGNOSIS — I5020 Unspecified systolic (congestive) heart failure: Secondary | ICD-10-CM | POA: Diagnosis not present

## 2023-05-09 DIAGNOSIS — J449 Chronic obstructive pulmonary disease, unspecified: Secondary | ICD-10-CM | POA: Diagnosis not present

## 2023-05-09 DIAGNOSIS — N184 Chronic kidney disease, stage 4 (severe): Secondary | ICD-10-CM | POA: Diagnosis not present

## 2023-05-09 DIAGNOSIS — I4891 Unspecified atrial fibrillation: Secondary | ICD-10-CM | POA: Diagnosis not present

## 2023-05-10 ENCOUNTER — Ambulatory Visit: Payer: Medicare PPO | Attending: Medical | Admitting: Medical

## 2023-05-10 DIAGNOSIS — I509 Heart failure, unspecified: Secondary | ICD-10-CM | POA: Diagnosis not present

## 2023-05-10 DIAGNOSIS — N184 Chronic kidney disease, stage 4 (severe): Secondary | ICD-10-CM | POA: Diagnosis not present

## 2023-05-10 DIAGNOSIS — I129 Hypertensive chronic kidney disease with stage 1 through stage 4 chronic kidney disease, or unspecified chronic kidney disease: Secondary | ICD-10-CM | POA: Diagnosis not present

## 2023-05-10 DIAGNOSIS — J449 Chronic obstructive pulmonary disease, unspecified: Secondary | ICD-10-CM | POA: Diagnosis not present

## 2023-05-10 DIAGNOSIS — I5023 Acute on chronic systolic (congestive) heart failure: Secondary | ICD-10-CM | POA: Diagnosis not present

## 2023-05-10 DIAGNOSIS — I4819 Other persistent atrial fibrillation: Secondary | ICD-10-CM | POA: Diagnosis not present

## 2023-05-10 DIAGNOSIS — E1122 Type 2 diabetes mellitus with diabetic chronic kidney disease: Secondary | ICD-10-CM | POA: Diagnosis not present

## 2023-05-10 NOTE — Progress Notes (Deleted)
Cardiology Office Note:    Date:  05/10/2023   ID:  Larry Callahan, DOB November 11, 1955, MRN [id number]  PCP:  Sallyanne Kuster, NP  [facility] Cardiologist:  Julien Nordmann, MD  [facility] Electrophysiologist:  None   Referring MD: Sallyanne Kuster, NP   Chief Complaint: ***  History of Present Illness:    Larry Callahan is a 67 y.o. male with a hx of HFrEF, hypertension, nonischemic cardiomyopathy, type 2 diabetes, typical atrial flutter, mixed hyperlipidemia, smoker, chronic renal insufficiency, OSA, chronic chest pain who is here for follow-up.  He originally presented to the ER in September 2021 with shortness of breath on exertion and chronic chest pain.  In July 2022 he was found to be in atrial flutter that persisted since that time.  Echo showed EF of 40 to 45%.  There was noted chronic confusion on medications as he had a visiting nurse that put his medications in a pillbox.  He was seen in the ER on August 2023 with chest pain and he was discharged home after negative workup.  He has had subsequent multiple hospitalizations at ER visits for various issues.  Patient was seen in August 2023 by Dr. Mariah Milling.  Repeat Myoview was ordered to rule out ischemia for chest pain.  This was abnormal with a small in size moderate in severity fixed apical and apical inferior defect that could represent infarct or artifact, no obvious ischemia, EF 40 to 45%, coronary artery calcification noted on CT attenuation correction.  Overall intermediate, risk it was a poor study  Patient was last seen April 2024 complaining of left shoulder pain.  He was overall stable from a cardiac perspective.  Today,  Past Medical History:  Diagnosis Date   Allergy    Atrial flutter (HCC)    a. Dx 12/2020--CHA2DS2VASc = 5-->eliquis.   Chronic combined systolic (congestive) and diastolic (congestive) heart failure (HCC)    a. 04/2019 Echo: EF 45-50%; b. 02/2019 Echo: EF 55-60%; c. 09/2020 Echo:  EF 35-40%, glob HK. Nl RV size/fxn. Mild MR; d. 07/2021 Echo: EF 40-45%, mild LVH, low-nl RV fxn, no MR.   CKD (chronic kidney disease), stage III (HCC)    COPD (chronic obstructive pulmonary disease) (HCC)    Coronary artery disease    a. 2011 Cath: nonobs dzs; b. 09/2020 MV: EF 38%. No signif ischemia. ? inf MI vs diaph attenuation. GI uptake artifact.   Diabetes mellitus without complication (HCC)    GERD (gastroesophageal reflux disease)    History of CVA (cerebrovascular accident) 09/25/2020   Hyperlipidemia    Hypertension    Morbid (severe) obesity due to excess calories (HCC) 06/09/2017   Morbid obesity (HCC)    NICM (nonischemic cardiomyopathy) (HCC)    a. 04/2019 Echo: EF 45-50%; b. 02/2019 Echo: EF 55-60%; c. 09/2020 Echo: EF 35-40%, glob HK; d. 09/2020 MV: No ischemia. ? inf infarct vs attenuation; d. 07/2021 Echo: EF 40-45%.   OSA (obstructive sleep apnea)    Pneumonia due to COVID-19 virus 01/30/2020   Stage 3b chronic kidney disease (HCC)    Syncope and collapse 11/16/2020    Past Surgical History:  Procedure Laterality Date   CARDIAC CATHETERIZATION     CHOLECYSTECTOMY     COLONOSCOPY WITH PROPOFOL N/A 04/12/2019   Procedure: COLONOSCOPY WITH PROPOFOL;  Surgeon: Toney Reil, MD;  Location: [facility] ENDOSCOPY;  Service: Gastroenterology;  Laterality: N/A;   ESOPHAGOGASTRODUODENOSCOPY N/A 04/12/2019   Procedure: ESOPHAGOGASTRODUODENOSCOPY (EGD);  Surgeon: Toney Reil, MD;  Location:  [name] ENDOSCOPY;  Service: Gastroenterology;  Laterality: N/A;   ESOPHAGOGASTRODUODENOSCOPY (EGD) WITH PROPOFOL N/A 11/21/2019   Procedure: ESOPHAGOGASTRODUODENOSCOPY (EGD) WITH PROPOFOL;  Surgeon: Toney Reil, MD;  Location: [facility] ENDOSCOPY;  Service: Gastroenterology;  Laterality: N/A;   ESOPHAGOGASTRODUODENOSCOPY (EGD) WITH PROPOFOL N/A 01/11/2022   Procedure: ESOPHAGOGASTRODUODENOSCOPY (EGD) WITH PROPOFOL;  Surgeon: Midge Minium, MD;  Location: [name] ENDOSCOPY;  Service: Endoscopy;   Laterality: N/A;   ESOPHAGOGASTRODUODENOSCOPY (EGD) WITH PROPOFOL N/A 08/02/2022   Procedure: ESOPHAGOGASTRODUODENOSCOPY (EGD) WITH PROPOFOL;  Surgeon: Midge Minium, MD;  Location: [facility] ENDOSCOPY;  Service: Endoscopy;  Laterality: N/A;   FLEXIBLE BRONCHOSCOPY Bilateral 05/17/2019   Procedure: FLEXIBLE BRONCHOSCOPY;  Surgeon: Yevonne Pax, MD;  Location: [name] ORS;  Service: Pulmonary;  Laterality: Bilateral;   left arm surgery     nephrectomy Left    PARATHYROIDECTOMY     RIGHT HEART CATH N/A 03/29/2019   Procedure: RIGHT HEART CATH;  Surgeon: Antonieta Iba, MD;  Location: [name] INVASIVE CV LAB;  Service: Cardiovascular;  Laterality: N/A;    Current Medications: No outpatient medications have been marked as taking for the 05/10/23 encounter (Appointment) with Fransico Michael, Dreyden Rohrman H, PA-C.     Allergies:   Penicillins and Codeine   Social History   Socioeconomic History   Marital status: Single    Spouse name: Not on file   Number of children: Not on file   Years of education: Not on file   Highest education level: Not on file  Occupational History   Not on file  Tobacco Use   Smoking status: Former    Current packs/day: 0.00    Types: Cigarettes    Quit date: 03/13/2020    Years since quitting: 3.1   Smokeless tobacco: Former    Types: Engineer, drilling   Vaping status: Never Used  Substance and Sexual Activity   Alcohol use: No   Drug use: No   Sexual activity: Not Currently  Other Topics Concern   Not on file  Social History Narrative   Not on file   Social Determinants of Health   Financial Resource Strain: Low Risk  (05/12/2021)   Overall Financial Resource Strain (CARDIA)    Difficulty of Paying Living Expenses: Not very hard  Food Insecurity: No Food Insecurity (04/04/2023)   Received from [facility]   Hunger Vital Sign    Worried About Running Out of Food in the Last Year: Never true    Ran Out of Food in the Last Year: Never true  Transportation Needs:  No Transportation Needs (04/04/2023)   Received from [facility]   PRAPARE - Transportation    Lack of Transportation (Medical): No    Lack of Transportation (Non-Medical): No  Physical Activity: Not on file  Stress: Not on file  Social Connections: Not on file     Family History: The patient's ***family history includes Bone cancer in his brother; Cancer in his sister; Diabetes in his mother and sister; Heart disease in his sister; Hypertension in his sister; Lung cancer in his father. There is no history of Prostate cancer, Bladder Cancer, or Kidney cancer.  ROS:   Please see the history of present illness.    *** All other systems reviewed and are negative.  EKGs/Labs/Other Studies Reviewed:    The following studies were reviewed today: ***  EKG:  EKG is *** ordered today.  The ekg ordered today demonstrates ***  Recent Labs: 09/24/2022: Magnesium 1.9 09/28/2022: B Natriuretic Peptide 161.6 11/14/2022: ALT  18 01/09/2023: BUN 55; Creatinine, Ser 2.73; Hemoglobin 8.7; Platelets 192; Potassium 4.6; Sodium 136  Recent Lipid Panel    Component Value Date/Time   CHOL 131 10/07/2020 0543   CHOL 151 02/27/2018 0844   CHOL 137 12/07/2012 0518   TRIG 142 10/07/2020 0543   TRIG 98 12/07/2012 0518   HDL 40 (L) 10/07/2020 0543   HDL 36 (L) 02/27/2018 0844   HDL 45 12/07/2012 0518   CHOLHDL 3.3 10/07/2020 0543   VLDL 28 10/07/2020 0543   VLDL 20 12/07/2012 0518   LDLCALC 63 10/07/2020 0543   LDLCALC 94 02/27/2018 0844   LDLCALC 72 12/07/2012 0518     Risk Assessment/Calculations:   {Does this patient have ATRIAL FIBRILLATION?:[phone]}   Physical Exam:    VS:  There were no vitals taken for this visit.    Wt Readings from Last 3 Encounters:  01/09/23 200 lb 9.9 oz (91 kg)  12/29/22 202 lb 9.6 oz (91.9 kg)  11/29/22 205 lb (93 kg)     GEN: *** Well nourished, well developed in no acute distress HEENT: Normal NECK: No JVD; No carotid bruits LYMPHATICS: No  lymphadenopathy CARDIAC: ***RRR, no murmurs, rubs, gallops RESPIRATORY:  Clear to auscultation without rales, wheezing or rhonchi  ABDOMEN: Soft, non-tender, non-distended MUSCULOSKELETAL:  No edema; No deformity  SKIN: Warm and dry NEUROLOGIC:  Alert and oriented x 3 PSYCHIATRIC:  Normal affect   ASSESSMENT:    No diagnosis found. PLAN:    In order of problems listed above:  ***  Disposition: Follow up {follow up:15908} with ***   Shared Decision Making/Informed Consent   {Are you ordering a CV Procedure (e.g. stress test, cath, DCCV, TEE, etc)?   Press F2        :[id number]}    Signed, Ramiel Forti Ardelle Lesches  05/10/2023 7:47 AM    [facility]

## 2023-05-11 DIAGNOSIS — I129 Hypertensive chronic kidney disease with stage 1 through stage 4 chronic kidney disease, or unspecified chronic kidney disease: Secondary | ICD-10-CM | POA: Diagnosis not present

## 2023-05-11 DIAGNOSIS — N184 Chronic kidney disease, stage 4 (severe): Secondary | ICD-10-CM | POA: Diagnosis not present

## 2023-05-11 DIAGNOSIS — I509 Heart failure, unspecified: Secondary | ICD-10-CM | POA: Diagnosis not present

## 2023-05-11 DIAGNOSIS — E1122 Type 2 diabetes mellitus with diabetic chronic kidney disease: Secondary | ICD-10-CM | POA: Diagnosis not present

## 2023-05-11 DIAGNOSIS — J449 Chronic obstructive pulmonary disease, unspecified: Secondary | ICD-10-CM | POA: Diagnosis not present

## 2023-05-12 DIAGNOSIS — I10 Essential (primary) hypertension: Secondary | ICD-10-CM | POA: Diagnosis not present

## 2023-05-12 DIAGNOSIS — I4891 Unspecified atrial fibrillation: Secondary | ICD-10-CM | POA: Diagnosis not present

## 2023-05-12 DIAGNOSIS — I509 Heart failure, unspecified: Secondary | ICD-10-CM | POA: Diagnosis not present

## 2023-05-12 DIAGNOSIS — J811 Chronic pulmonary edema: Secondary | ICD-10-CM | POA: Diagnosis not present

## 2023-05-12 DIAGNOSIS — J449 Chronic obstructive pulmonary disease, unspecified: Secondary | ICD-10-CM | POA: Diagnosis not present

## 2023-05-12 DIAGNOSIS — R001 Bradycardia, unspecified: Secondary | ICD-10-CM | POA: Diagnosis not present

## 2023-05-12 DIAGNOSIS — J9811 Atelectasis: Secondary | ICD-10-CM | POA: Diagnosis not present

## 2023-05-12 DIAGNOSIS — J90 Pleural effusion, not elsewhere classified: Secondary | ICD-10-CM | POA: Diagnosis not present

## 2023-05-13 DIAGNOSIS — I509 Heart failure, unspecified: Secondary | ICD-10-CM | POA: Diagnosis not present

## 2023-05-13 DIAGNOSIS — I517 Cardiomegaly: Secondary | ICD-10-CM | POA: Diagnosis not present

## 2023-05-13 DIAGNOSIS — I130 Hypertensive heart and chronic kidney disease with heart failure and stage 1 through stage 4 chronic kidney disease, or unspecified chronic kidney disease: Secondary | ICD-10-CM | POA: Diagnosis not present

## 2023-05-13 DIAGNOSIS — R918 Other nonspecific abnormal finding of lung field: Secondary | ICD-10-CM | POA: Diagnosis not present

## 2023-05-13 DIAGNOSIS — J90 Pleural effusion, not elsewhere classified: Secondary | ICD-10-CM | POA: Diagnosis not present

## 2023-05-13 DIAGNOSIS — R001 Bradycardia, unspecified: Secondary | ICD-10-CM | POA: Diagnosis not present

## 2023-05-13 DIAGNOSIS — I77810 Thoracic aortic ectasia: Secondary | ICD-10-CM | POA: Diagnosis not present

## 2023-05-13 DIAGNOSIS — I2510 Atherosclerotic heart disease of native coronary artery without angina pectoris: Secondary | ICD-10-CM | POA: Diagnosis not present

## 2023-05-13 DIAGNOSIS — M47814 Spondylosis without myelopathy or radiculopathy, thoracic region: Secondary | ICD-10-CM | POA: Diagnosis not present

## 2023-05-13 DIAGNOSIS — R4182 Altered mental status, unspecified: Secondary | ICD-10-CM | POA: Diagnosis not present

## 2023-05-13 DIAGNOSIS — N184 Chronic kidney disease, stage 4 (severe): Secondary | ICD-10-CM | POA: Diagnosis not present

## 2023-05-13 DIAGNOSIS — I4891 Unspecified atrial fibrillation: Secondary | ICD-10-CM | POA: Diagnosis not present

## 2023-05-13 DIAGNOSIS — R441 Visual hallucinations: Secondary | ICD-10-CM | POA: Diagnosis not present

## 2023-05-14 DIAGNOSIS — R001 Bradycardia, unspecified: Secondary | ICD-10-CM | POA: Diagnosis not present

## 2023-05-14 DIAGNOSIS — J449 Chronic obstructive pulmonary disease, unspecified: Secondary | ICD-10-CM | POA: Diagnosis not present

## 2023-05-14 DIAGNOSIS — I4891 Unspecified atrial fibrillation: Secondary | ICD-10-CM | POA: Diagnosis not present

## 2023-05-14 DIAGNOSIS — R441 Visual hallucinations: Secondary | ICD-10-CM | POA: Diagnosis not present

## 2023-05-14 DIAGNOSIS — N184 Chronic kidney disease, stage 4 (severe): Secondary | ICD-10-CM | POA: Diagnosis not present

## 2023-05-14 DIAGNOSIS — I509 Heart failure, unspecified: Secondary | ICD-10-CM | POA: Diagnosis not present

## 2023-05-14 DIAGNOSIS — J90 Pleural effusion, not elsewhere classified: Secondary | ICD-10-CM | POA: Diagnosis not present

## 2023-05-15 DIAGNOSIS — E1122 Type 2 diabetes mellitus with diabetic chronic kidney disease: Secondary | ICD-10-CM | POA: Diagnosis not present

## 2023-05-15 DIAGNOSIS — I509 Heart failure, unspecified: Secondary | ICD-10-CM | POA: Diagnosis not present

## 2023-05-15 DIAGNOSIS — I129 Hypertensive chronic kidney disease with stage 1 through stage 4 chronic kidney disease, or unspecified chronic kidney disease: Secondary | ICD-10-CM | POA: Diagnosis not present

## 2023-05-15 DIAGNOSIS — N184 Chronic kidney disease, stage 4 (severe): Secondary | ICD-10-CM | POA: Diagnosis not present

## 2023-05-15 DIAGNOSIS — R0902 Hypoxemia: Secondary | ICD-10-CM | POA: Diagnosis not present

## 2023-05-15 DIAGNOSIS — I4891 Unspecified atrial fibrillation: Secondary | ICD-10-CM | POA: Diagnosis not present

## 2023-05-16 ENCOUNTER — Telehealth: Payer: Self-pay | Admitting: Nurse Practitioner

## 2023-05-16 DIAGNOSIS — I130 Hypertensive heart and chronic kidney disease with heart failure and stage 1 through stage 4 chronic kidney disease, or unspecified chronic kidney disease: Secondary | ICD-10-CM | POA: Diagnosis not present

## 2023-05-16 DIAGNOSIS — I4891 Unspecified atrial fibrillation: Secondary | ICD-10-CM | POA: Diagnosis not present

## 2023-05-16 DIAGNOSIS — N184 Chronic kidney disease, stage 4 (severe): Secondary | ICD-10-CM | POA: Diagnosis not present

## 2023-05-16 DIAGNOSIS — E1122 Type 2 diabetes mellitus with diabetic chronic kidney disease: Secondary | ICD-10-CM | POA: Diagnosis not present

## 2023-05-16 DIAGNOSIS — I509 Heart failure, unspecified: Secondary | ICD-10-CM | POA: Diagnosis not present

## 2023-05-16 NOTE — Telephone Encounter (Signed)
Attempted to contact patient to schedule ED follow up. No answer, unable to leave [facility]

## 2023-05-17 DIAGNOSIS — E1122 Type 2 diabetes mellitus with diabetic chronic kidney disease: Secondary | ICD-10-CM | POA: Diagnosis not present

## 2023-05-17 DIAGNOSIS — I4892 Unspecified atrial flutter: Secondary | ICD-10-CM | POA: Diagnosis not present

## 2023-05-17 DIAGNOSIS — I483 Typical atrial flutter: Secondary | ICD-10-CM | POA: Diagnosis not present

## 2023-05-17 DIAGNOSIS — N184 Chronic kidney disease, stage 4 (severe): Secondary | ICD-10-CM | POA: Diagnosis not present

## 2023-05-17 DIAGNOSIS — I509 Heart failure, unspecified: Secondary | ICD-10-CM | POA: Diagnosis not present

## 2023-05-17 DIAGNOSIS — I130 Hypertensive heart and chronic kidney disease with heart failure and stage 1 through stage 4 chronic kidney disease, or unspecified chronic kidney disease: Secondary | ICD-10-CM | POA: Diagnosis not present

## 2023-05-23 DIAGNOSIS — I110 Hypertensive heart disease with heart failure: Secondary | ICD-10-CM | POA: Diagnosis not present

## 2023-05-23 DIAGNOSIS — A499 Bacterial infection, unspecified: Secondary | ICD-10-CM | POA: Diagnosis not present

## 2023-05-23 DIAGNOSIS — M6281 Muscle weakness (generalized): Secondary | ICD-10-CM | POA: Diagnosis not present

## 2023-05-23 DIAGNOSIS — R279 Unspecified lack of coordination: Secondary | ICD-10-CM | POA: Diagnosis not present

## 2023-05-23 DIAGNOSIS — E611 Iron deficiency: Secondary | ICD-10-CM | POA: Diagnosis not present

## 2023-05-23 DIAGNOSIS — M25531 Pain in right wrist: Secondary | ICD-10-CM | POA: Diagnosis not present

## 2023-05-23 DIAGNOSIS — K5900 Constipation, unspecified: Secondary | ICD-10-CM | POA: Diagnosis not present

## 2023-05-23 DIAGNOSIS — R5383 Other fatigue: Secondary | ICD-10-CM | POA: Diagnosis not present

## 2023-05-23 DIAGNOSIS — I5033 Acute on chronic diastolic (congestive) heart failure: Secondary | ICD-10-CM | POA: Diagnosis not present

## 2023-05-23 DIAGNOSIS — I509 Heart failure, unspecified: Secondary | ICD-10-CM | POA: Diagnosis not present

## 2023-05-23 DIAGNOSIS — U071 COVID-19: Secondary | ICD-10-CM | POA: Diagnosis not present

## 2023-05-23 DIAGNOSIS — I10 Essential (primary) hypertension: Secondary | ICD-10-CM | POA: Diagnosis not present

## 2023-05-23 DIAGNOSIS — L89154 Pressure ulcer of sacral region, stage 4: Secondary | ICD-10-CM | POA: Diagnosis not present

## 2023-05-23 DIAGNOSIS — E119 Type 2 diabetes mellitus without complications: Secondary | ICD-10-CM | POA: Diagnosis not present

## 2023-05-23 DIAGNOSIS — Z741 Need for assistance with personal care: Secondary | ICD-10-CM | POA: Diagnosis not present

## 2023-05-23 DIAGNOSIS — R5381 Other malaise: Secondary | ICD-10-CM | POA: Diagnosis not present

## 2023-05-23 DIAGNOSIS — J449 Chronic obstructive pulmonary disease, unspecified: Secondary | ICD-10-CM | POA: Diagnosis not present

## 2023-05-23 DIAGNOSIS — E785 Hyperlipidemia, unspecified: Secondary | ICD-10-CM | POA: Diagnosis not present

## 2023-05-23 DIAGNOSIS — L89896 Pressure-induced deep tissue damage of other site: Secondary | ICD-10-CM | POA: Diagnosis not present

## 2023-05-23 DIAGNOSIS — R451 Restlessness and agitation: Secondary | ICD-10-CM | POA: Diagnosis not present

## 2023-05-23 DIAGNOSIS — R2681 Unsteadiness on feet: Secondary | ICD-10-CM | POA: Diagnosis not present

## 2023-05-23 DIAGNOSIS — F99 Mental disorder, not otherwise specified: Secondary | ICD-10-CM | POA: Diagnosis not present

## 2023-05-23 DIAGNOSIS — R059 Cough, unspecified: Secondary | ICD-10-CM | POA: Diagnosis not present

## 2023-05-23 DIAGNOSIS — I4892 Unspecified atrial flutter: Secondary | ICD-10-CM | POA: Diagnosis not present

## 2023-05-23 DIAGNOSIS — D509 Iron deficiency anemia, unspecified: Secondary | ICD-10-CM | POA: Diagnosis not present

## 2023-05-23 DIAGNOSIS — G4733 Obstructive sleep apnea (adult) (pediatric): Secondary | ICD-10-CM | POA: Diagnosis not present

## 2023-05-23 DIAGNOSIS — N184 Chronic kidney disease, stage 4 (severe): Secondary | ICD-10-CM | POA: Diagnosis not present

## 2023-05-23 DIAGNOSIS — S51011A Laceration without foreign body of right elbow, initial encounter: Secondary | ICD-10-CM | POA: Diagnosis not present

## 2023-05-23 DIAGNOSIS — L89150 Pressure ulcer of sacral region, unstageable: Secondary | ICD-10-CM | POA: Diagnosis not present

## 2023-05-23 DIAGNOSIS — E1140 Type 2 diabetes mellitus with diabetic neuropathy, unspecified: Secondary | ICD-10-CM | POA: Diagnosis not present

## 2023-05-23 DIAGNOSIS — N2889 Other specified disorders of kidney and ureter: Secondary | ICD-10-CM | POA: Diagnosis not present

## 2023-05-23 DIAGNOSIS — R41841 Cognitive communication deficit: Secondary | ICD-10-CM | POA: Diagnosis not present

## 2023-05-23 DIAGNOSIS — K219 Gastro-esophageal reflux disease without esophagitis: Secondary | ICD-10-CM | POA: Diagnosis not present

## 2023-05-23 DIAGNOSIS — D631 Anemia in chronic kidney disease: Secondary | ICD-10-CM | POA: Diagnosis not present

## 2023-05-23 DIAGNOSIS — R110 Nausea: Secondary | ICD-10-CM | POA: Diagnosis not present

## 2023-05-23 DIAGNOSIS — F05 Delirium due to known physiological condition: Secondary | ICD-10-CM | POA: Diagnosis not present

## 2023-05-25 DIAGNOSIS — J449 Chronic obstructive pulmonary disease, unspecified: Secondary | ICD-10-CM | POA: Diagnosis not present

## 2023-05-25 DIAGNOSIS — I4892 Unspecified atrial flutter: Secondary | ICD-10-CM | POA: Diagnosis not present

## 2023-05-25 DIAGNOSIS — G4733 Obstructive sleep apnea (adult) (pediatric): Secondary | ICD-10-CM | POA: Diagnosis not present

## 2023-05-25 DIAGNOSIS — N184 Chronic kidney disease, stage 4 (severe): Secondary | ICD-10-CM | POA: Diagnosis not present

## 2023-05-25 DIAGNOSIS — I5033 Acute on chronic diastolic (congestive) heart failure: Secondary | ICD-10-CM | POA: Diagnosis not present

## 2023-05-25 DIAGNOSIS — E1140 Type 2 diabetes mellitus with diabetic neuropathy, unspecified: Secondary | ICD-10-CM | POA: Diagnosis not present

## 2023-05-29 DIAGNOSIS — E119 Type 2 diabetes mellitus without complications: Secondary | ICD-10-CM | POA: Diagnosis not present

## 2023-05-29 DIAGNOSIS — K219 Gastro-esophageal reflux disease without esophagitis: Secondary | ICD-10-CM | POA: Diagnosis not present

## 2023-05-29 DIAGNOSIS — J449 Chronic obstructive pulmonary disease, unspecified: Secondary | ICD-10-CM | POA: Diagnosis not present

## 2023-05-29 DIAGNOSIS — I5033 Acute on chronic diastolic (congestive) heart failure: Secondary | ICD-10-CM | POA: Diagnosis not present

## 2023-05-29 DIAGNOSIS — M25531 Pain in right wrist: Secondary | ICD-10-CM | POA: Diagnosis not present

## 2023-05-29 DIAGNOSIS — K5900 Constipation, unspecified: Secondary | ICD-10-CM | POA: Diagnosis not present

## 2023-05-29 DIAGNOSIS — E785 Hyperlipidemia, unspecified: Secondary | ICD-10-CM | POA: Diagnosis not present

## 2023-05-29 DIAGNOSIS — D509 Iron deficiency anemia, unspecified: Secondary | ICD-10-CM | POA: Diagnosis not present

## 2023-05-29 DIAGNOSIS — N184 Chronic kidney disease, stage 4 (severe): Secondary | ICD-10-CM | POA: Diagnosis not present

## 2023-05-30 DIAGNOSIS — E785 Hyperlipidemia, unspecified: Secondary | ICD-10-CM | POA: Diagnosis not present

## 2023-05-30 DIAGNOSIS — L89896 Pressure-induced deep tissue damage of other site: Secondary | ICD-10-CM | POA: Diagnosis not present

## 2023-05-30 DIAGNOSIS — L89150 Pressure ulcer of sacral region, unstageable: Secondary | ICD-10-CM | POA: Diagnosis not present

## 2023-05-30 DIAGNOSIS — E119 Type 2 diabetes mellitus without complications: Secondary | ICD-10-CM | POA: Diagnosis not present

## 2023-05-31 DIAGNOSIS — N184 Chronic kidney disease, stage 4 (severe): Secondary | ICD-10-CM | POA: Diagnosis not present

## 2023-05-31 DIAGNOSIS — I10 Essential (primary) hypertension: Secondary | ICD-10-CM | POA: Diagnosis not present

## 2023-05-31 DIAGNOSIS — E611 Iron deficiency: Secondary | ICD-10-CM | POA: Diagnosis not present

## 2023-05-31 DIAGNOSIS — N2889 Other specified disorders of kidney and ureter: Secondary | ICD-10-CM | POA: Diagnosis not present

## 2023-05-31 DIAGNOSIS — D631 Anemia in chronic kidney disease: Secondary | ICD-10-CM | POA: Diagnosis not present

## 2023-05-31 DIAGNOSIS — D509 Iron deficiency anemia, unspecified: Secondary | ICD-10-CM | POA: Diagnosis not present

## 2023-06-01 DIAGNOSIS — I5033 Acute on chronic diastolic (congestive) heart failure: Secondary | ICD-10-CM | POA: Diagnosis not present

## 2023-06-01 DIAGNOSIS — R451 Restlessness and agitation: Secondary | ICD-10-CM | POA: Diagnosis not present

## 2023-06-05 DIAGNOSIS — R5381 Other malaise: Secondary | ICD-10-CM | POA: Diagnosis not present

## 2023-06-05 DIAGNOSIS — R5383 Other fatigue: Secondary | ICD-10-CM | POA: Diagnosis not present

## 2023-06-05 DIAGNOSIS — U071 COVID-19: Secondary | ICD-10-CM | POA: Diagnosis not present

## 2023-06-05 DIAGNOSIS — R110 Nausea: Secondary | ICD-10-CM | POA: Diagnosis not present

## 2023-06-06 DIAGNOSIS — R5383 Other fatigue: Secondary | ICD-10-CM | POA: Diagnosis not present

## 2023-06-06 DIAGNOSIS — U071 COVID-19: Secondary | ICD-10-CM | POA: Diagnosis not present

## 2023-06-06 DIAGNOSIS — R059 Cough, unspecified: Secondary | ICD-10-CM | POA: Diagnosis not present

## 2023-06-12 DIAGNOSIS — U071 COVID-19: Secondary | ICD-10-CM | POA: Diagnosis not present

## 2023-06-12 DIAGNOSIS — R5383 Other fatigue: Secondary | ICD-10-CM | POA: Diagnosis not present

## 2023-06-15 ENCOUNTER — Telehealth: Payer: Self-pay

## 2023-06-15 DIAGNOSIS — E785 Hyperlipidemia, unspecified: Secondary | ICD-10-CM | POA: Diagnosis not present

## 2023-06-15 DIAGNOSIS — L89896 Pressure-induced deep tissue damage of other site: Secondary | ICD-10-CM | POA: Diagnosis not present

## 2023-06-15 DIAGNOSIS — F70 Mild intellectual disabilities: Secondary | ICD-10-CM | POA: Diagnosis not present

## 2023-06-15 DIAGNOSIS — E119 Type 2 diabetes mellitus without complications: Secondary | ICD-10-CM | POA: Diagnosis not present

## 2023-06-15 NOTE — Patient Outreach (Signed)
  Care Coordination   06/15/2023 Name: Larry Callahan MRN: [id number] DOB: 1955/09/30   Care Coordination Outreach Attempts:  Attempted call to patients listed contact phone number.  Phone disconnected. Attempted call to listed DPR / family member Leonor Lemmings.  Contact answering phone states patient is in a rehab facility and states [facility] passed away.  Contact not listed on DPR.  Advised contact to follow up with facility where patient is currently residing.   Follow Up Plan:  No further outreach attempts will be made at this time. We have been unable to contact the patient to offer or enroll patient in complex care management services.  Encounter Outcome: Phone call completed.   Care Coordination Interventions:  No, not indicated    Imunique Samad RN,BSN,CCM [facility]  [facility], [facility] coordinator / Case Manager Phone: [phone]

## 2023-06-16 DIAGNOSIS — R059 Cough, unspecified: Secondary | ICD-10-CM | POA: Diagnosis not present

## 2023-06-16 DIAGNOSIS — U071 COVID-19: Secondary | ICD-10-CM | POA: Diagnosis not present

## 2023-06-16 DIAGNOSIS — R110 Nausea: Secondary | ICD-10-CM | POA: Diagnosis not present

## 2023-06-21 DIAGNOSIS — R5383 Other fatigue: Secondary | ICD-10-CM | POA: Diagnosis not present

## 2023-06-21 DIAGNOSIS — R110 Nausea: Secondary | ICD-10-CM | POA: Diagnosis not present

## 2023-06-22 DIAGNOSIS — F70 Mild intellectual disabilities: Secondary | ICD-10-CM | POA: Diagnosis not present

## 2023-06-22 DIAGNOSIS — E119 Type 2 diabetes mellitus without complications: Secondary | ICD-10-CM | POA: Diagnosis not present

## 2023-06-22 DIAGNOSIS — E785 Hyperlipidemia, unspecified: Secondary | ICD-10-CM | POA: Diagnosis not present

## 2023-06-22 DIAGNOSIS — L89896 Pressure-induced deep tissue damage of other site: Secondary | ICD-10-CM | POA: Diagnosis not present

## 2023-06-22 DIAGNOSIS — L89154 Pressure ulcer of sacral region, stage 4: Secondary | ICD-10-CM | POA: Diagnosis not present

## 2023-06-22 DIAGNOSIS — S51011A Laceration without foreign body of right elbow, initial encounter: Secondary | ICD-10-CM | POA: Diagnosis not present

## 2023-06-23 DIAGNOSIS — Z5320 Procedure and treatment not carried out because of patient's decision for unspecified reasons: Secondary | ICD-10-CM | POA: Diagnosis not present

## 2023-06-23 DIAGNOSIS — Z79899 Other long term (current) drug therapy: Secondary | ICD-10-CM | POA: Diagnosis not present

## 2023-06-26 DIAGNOSIS — M24549 Contracture, unspecified hand: Secondary | ICD-10-CM | POA: Diagnosis not present

## 2023-06-26 DIAGNOSIS — G4730 Sleep apnea, unspecified: Secondary | ICD-10-CM | POA: Diagnosis not present

## 2023-06-29 DIAGNOSIS — L89896 Pressure-induced deep tissue damage of other site: Secondary | ICD-10-CM | POA: Diagnosis not present

## 2023-06-29 DIAGNOSIS — L89154 Pressure ulcer of sacral region, stage 4: Secondary | ICD-10-CM | POA: Diagnosis not present

## 2023-06-29 DIAGNOSIS — S51011A Laceration without foreign body of right elbow, initial encounter: Secondary | ICD-10-CM | POA: Diagnosis not present

## 2023-06-30 DIAGNOSIS — G4730 Sleep apnea, unspecified: Secondary | ICD-10-CM | POA: Diagnosis not present

## 2023-06-30 DIAGNOSIS — M79601 Pain in right arm: Secondary | ICD-10-CM | POA: Diagnosis not present

## 2023-07-03 DIAGNOSIS — R2681 Unsteadiness on feet: Secondary | ICD-10-CM | POA: Diagnosis not present

## 2023-07-03 DIAGNOSIS — M6281 Muscle weakness (generalized): Secondary | ICD-10-CM | POA: Diagnosis not present

## 2023-07-03 DIAGNOSIS — I5033 Acute on chronic diastolic (congestive) heart failure: Secondary | ICD-10-CM | POA: Diagnosis not present

## 2023-07-04 DIAGNOSIS — L89154 Pressure ulcer of sacral region, stage 4: Secondary | ICD-10-CM | POA: Diagnosis not present

## 2023-07-04 DIAGNOSIS — I5033 Acute on chronic diastolic (congestive) heart failure: Secondary | ICD-10-CM | POA: Diagnosis not present

## 2023-07-04 DIAGNOSIS — M6281 Muscle weakness (generalized): Secondary | ICD-10-CM | POA: Diagnosis not present

## 2023-07-05 DIAGNOSIS — R2681 Unsteadiness on feet: Secondary | ICD-10-CM | POA: Diagnosis not present

## 2023-07-05 DIAGNOSIS — M6281 Muscle weakness (generalized): Secondary | ICD-10-CM | POA: Diagnosis not present

## 2023-07-05 DIAGNOSIS — I5033 Acute on chronic diastolic (congestive) heart failure: Secondary | ICD-10-CM | POA: Diagnosis not present

## 2023-07-06 DIAGNOSIS — M6281 Muscle weakness (generalized): Secondary | ICD-10-CM | POA: Diagnosis not present

## 2023-07-06 DIAGNOSIS — S51011A Laceration without foreign body of right elbow, initial encounter: Secondary | ICD-10-CM | POA: Diagnosis not present

## 2023-07-06 DIAGNOSIS — I5033 Acute on chronic diastolic (congestive) heart failure: Secondary | ICD-10-CM | POA: Diagnosis not present

## 2023-07-06 DIAGNOSIS — L89154 Pressure ulcer of sacral region, stage 4: Secondary | ICD-10-CM | POA: Diagnosis not present

## 2023-07-06 DIAGNOSIS — L89896 Pressure-induced deep tissue damage of other site: Secondary | ICD-10-CM | POA: Diagnosis not present

## 2023-07-07 DIAGNOSIS — I5033 Acute on chronic diastolic (congestive) heart failure: Secondary | ICD-10-CM | POA: Diagnosis not present

## 2023-07-07 DIAGNOSIS — M6281 Muscle weakness (generalized): Secondary | ICD-10-CM | POA: Diagnosis not present

## 2023-07-10 DIAGNOSIS — M6281 Muscle weakness (generalized): Secondary | ICD-10-CM | POA: Diagnosis not present

## 2023-07-10 DIAGNOSIS — I5033 Acute on chronic diastolic (congestive) heart failure: Secondary | ICD-10-CM | POA: Diagnosis not present

## 2023-07-11 DIAGNOSIS — M6281 Muscle weakness (generalized): Secondary | ICD-10-CM | POA: Diagnosis not present

## 2023-07-11 DIAGNOSIS — I5033 Acute on chronic diastolic (congestive) heart failure: Secondary | ICD-10-CM | POA: Diagnosis not present

## 2023-07-12 DIAGNOSIS — M6281 Muscle weakness (generalized): Secondary | ICD-10-CM | POA: Diagnosis not present

## 2023-07-12 DIAGNOSIS — G4733 Obstructive sleep apnea (adult) (pediatric): Secondary | ICD-10-CM | POA: Diagnosis not present

## 2023-07-12 DIAGNOSIS — I5033 Acute on chronic diastolic (congestive) heart failure: Secondary | ICD-10-CM | POA: Diagnosis not present

## 2023-07-13 DIAGNOSIS — I5042 Chronic combined systolic (congestive) and diastolic (congestive) heart failure: Secondary | ICD-10-CM | POA: Diagnosis not present

## 2023-07-13 DIAGNOSIS — I440 Atrioventricular block, first degree: Secondary | ICD-10-CM | POA: Diagnosis not present

## 2023-07-13 DIAGNOSIS — I10 Essential (primary) hypertension: Secondary | ICD-10-CM | POA: Diagnosis not present

## 2023-07-13 DIAGNOSIS — M6281 Muscle weakness (generalized): Secondary | ICD-10-CM | POA: Diagnosis not present

## 2023-07-13 DIAGNOSIS — I483 Typical atrial flutter: Secondary | ICD-10-CM | POA: Diagnosis not present

## 2023-07-13 DIAGNOSIS — I5033 Acute on chronic diastolic (congestive) heart failure: Secondary | ICD-10-CM | POA: Diagnosis not present

## 2023-07-13 DIAGNOSIS — N189 Chronic kidney disease, unspecified: Secondary | ICD-10-CM | POA: Diagnosis not present

## 2023-07-13 DIAGNOSIS — I2510 Atherosclerotic heart disease of native coronary artery without angina pectoris: Secondary | ICD-10-CM | POA: Diagnosis not present

## 2023-07-13 DIAGNOSIS — F70 Mild intellectual disabilities: Secondary | ICD-10-CM | POA: Diagnosis not present

## 2023-07-13 DIAGNOSIS — S51011A Laceration without foreign body of right elbow, initial encounter: Secondary | ICD-10-CM | POA: Diagnosis not present

## 2023-07-13 DIAGNOSIS — E1122 Type 2 diabetes mellitus with diabetic chronic kidney disease: Secondary | ICD-10-CM | POA: Diagnosis not present

## 2023-07-13 DIAGNOSIS — E785 Hyperlipidemia, unspecified: Secondary | ICD-10-CM | POA: Diagnosis not present

## 2023-07-13 DIAGNOSIS — L89896 Pressure-induced deep tissue damage of other site: Secondary | ICD-10-CM | POA: Diagnosis not present

## 2023-07-13 DIAGNOSIS — J449 Chronic obstructive pulmonary disease, unspecified: Secondary | ICD-10-CM | POA: Diagnosis not present

## 2023-07-13 DIAGNOSIS — I130 Hypertensive heart and chronic kidney disease with heart failure and stage 1 through stage 4 chronic kidney disease, or unspecified chronic kidney disease: Secondary | ICD-10-CM | POA: Diagnosis not present

## 2023-07-13 DIAGNOSIS — I499 Cardiac arrhythmia, unspecified: Secondary | ICD-10-CM | POA: Diagnosis not present

## 2023-07-13 DIAGNOSIS — E119 Type 2 diabetes mellitus without complications: Secondary | ICD-10-CM | POA: Diagnosis not present

## 2023-07-13 DIAGNOSIS — L89154 Pressure ulcer of sacral region, stage 4: Secondary | ICD-10-CM | POA: Diagnosis not present

## 2023-07-13 DIAGNOSIS — Z7901 Long term (current) use of anticoagulants: Secondary | ICD-10-CM | POA: Diagnosis not present

## 2023-07-13 IMAGING — CT CT ORBITS W/O CM
3 of 6 series · 14 of 47 positions shown, 17 images · non-contrast
Comparison: None Available.

CLINICAL DATA: Asymptomatic right eye swelling.

EXAM:
CT ORBITS WITHOUT CONTRAST
TECHNIQUE: Multidetector CT imaging of the orbits was performed using the
standard protocol without intravenous contrast. Multiplanar CT image
reconstructions were also generated.
RADIATION DOSE REDUCTION: This exam was performed according to the
departmental dose-optimization program which includes automated
exposure control, adjustment of the mA and/or kV according to
patient size and/or use of iterative reconstruction technique.

[Series 2: orbits 2.0 hr60 bone · axial · 0.36mm/px · z∈[+98,+198]mm · 9 of 60 slices shown, 12 images]
[im 5/60  brain]
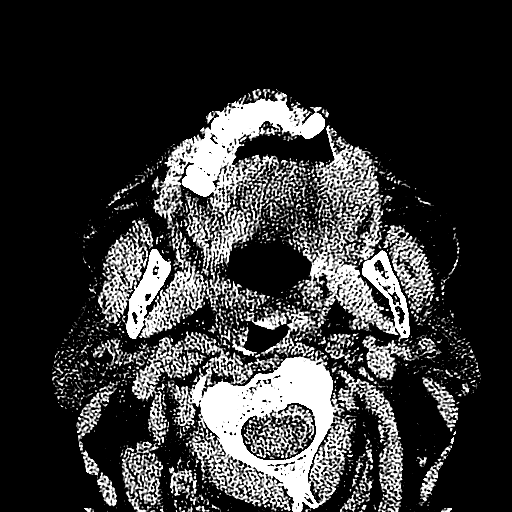
[im 5/60  bone]
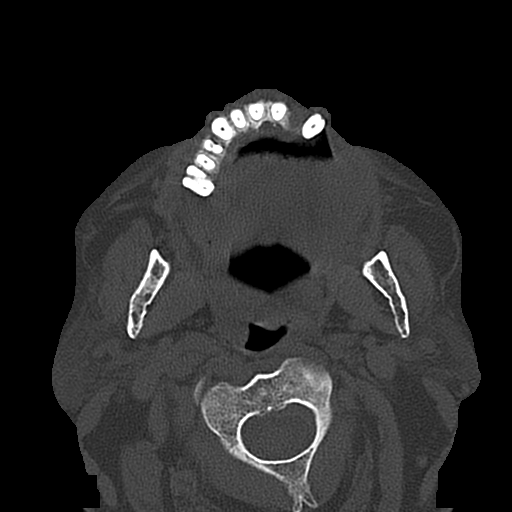
[im 13/60  bone]
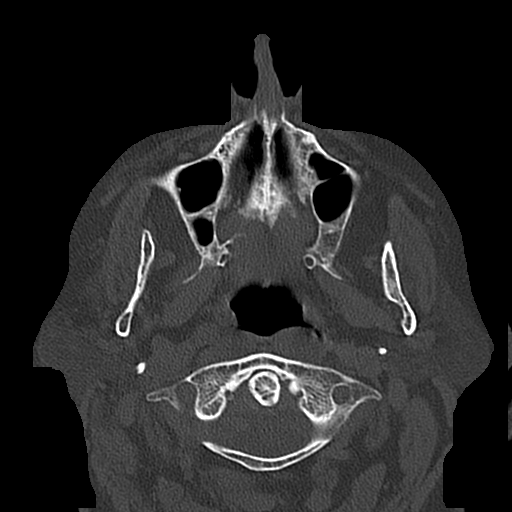
[im 17/60  bone]
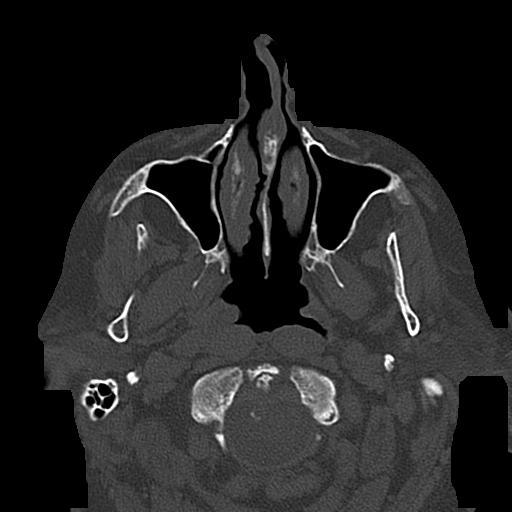
[im 26/60  bone]
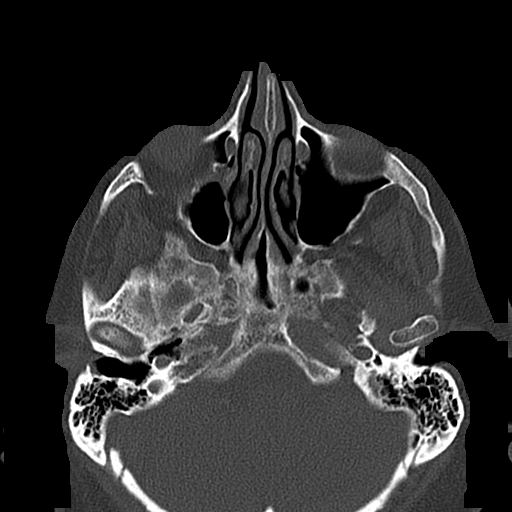
[im 30/60  brain]
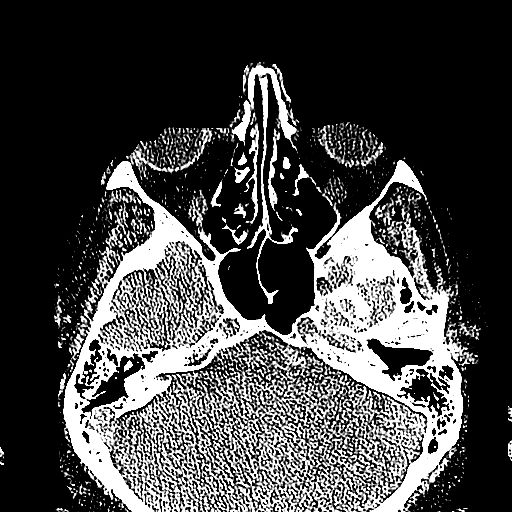
[im 30/60  bone]
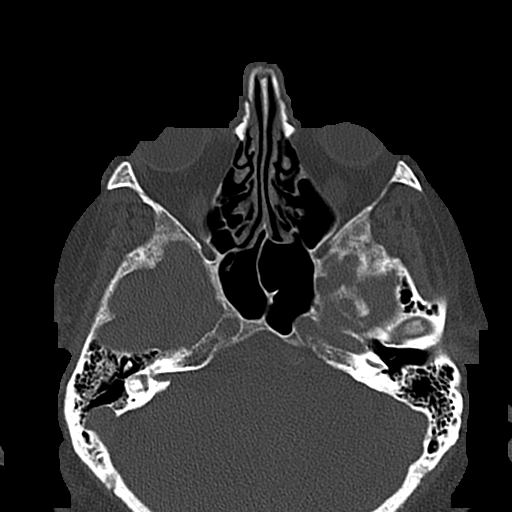
[im 34/60  bone]
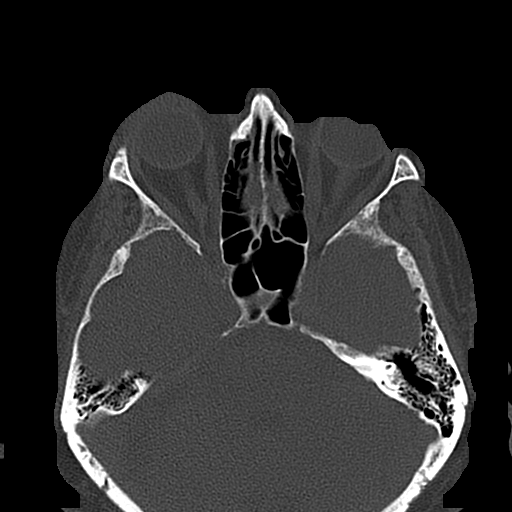
[im 43/60  bone]
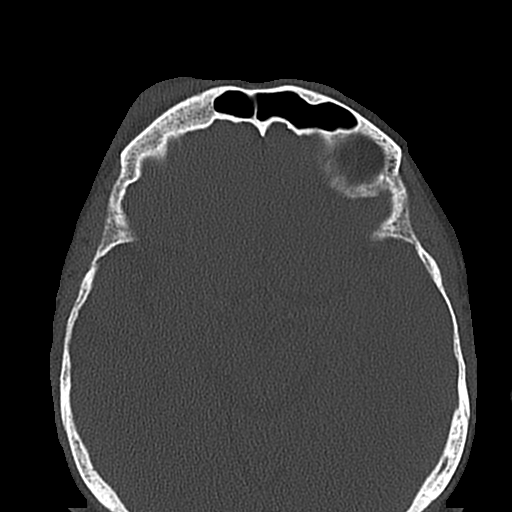
[im 47/60  bone]
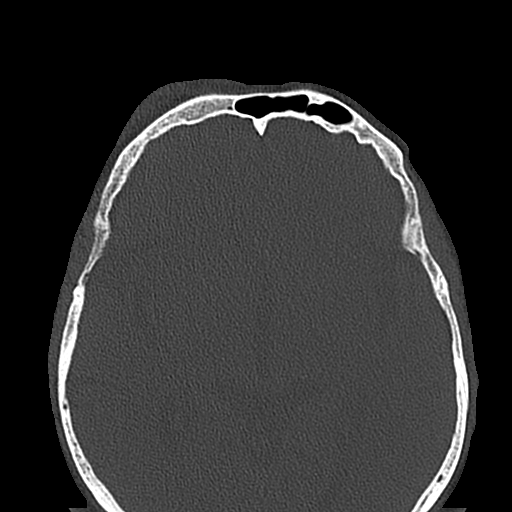
[im 55/60  brain]
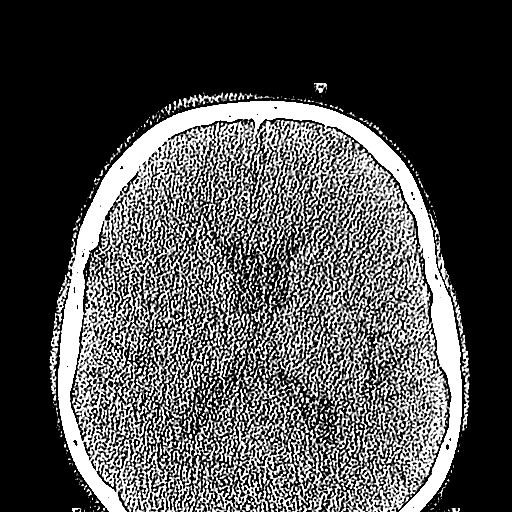
[im 55/60  bone]
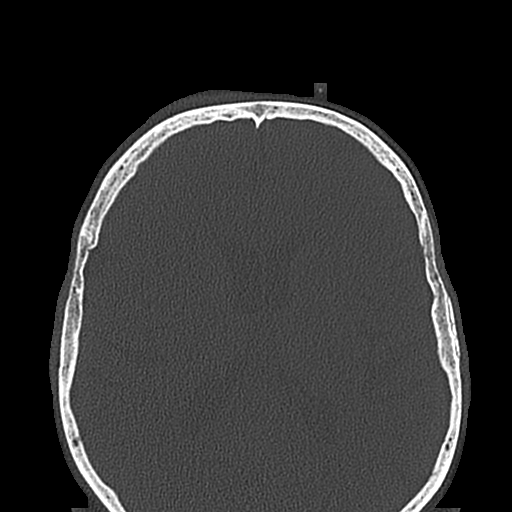

[Series 4: orbits 2.0 coronal · coronal · 0.27mm/px · 3 of 83 slices shown]
[im 21/83  bone]
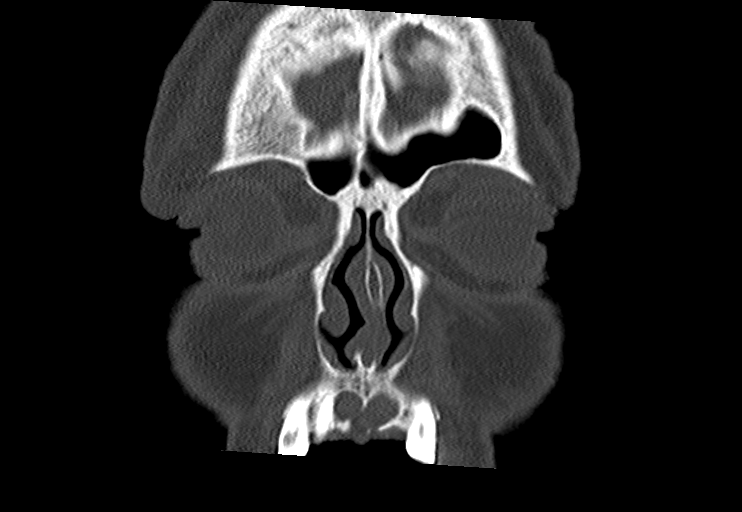
[im 42/83  bone]
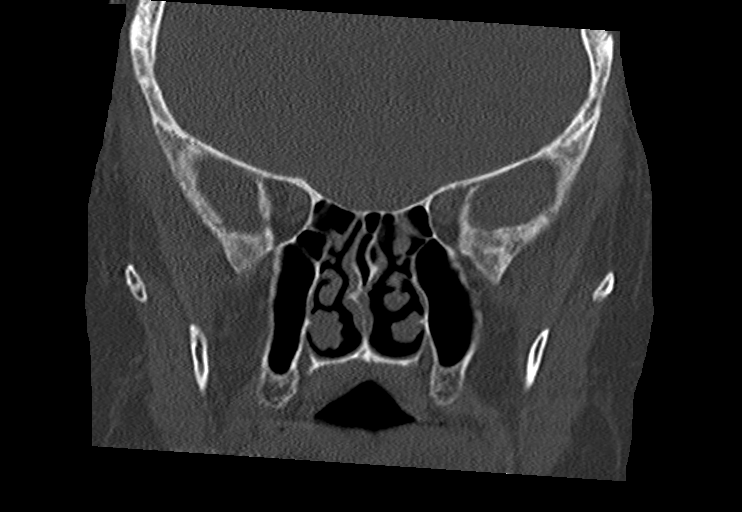
[im 62/83  bone]
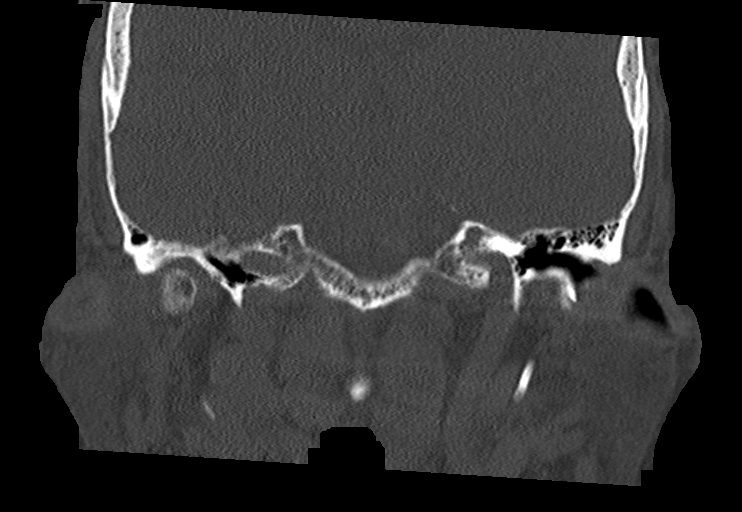

[Series 9: orbits 2.0 sagittal · sagittal · 0.22mm/px · 2 of 87 slices shown]
[im 29/87  bone]
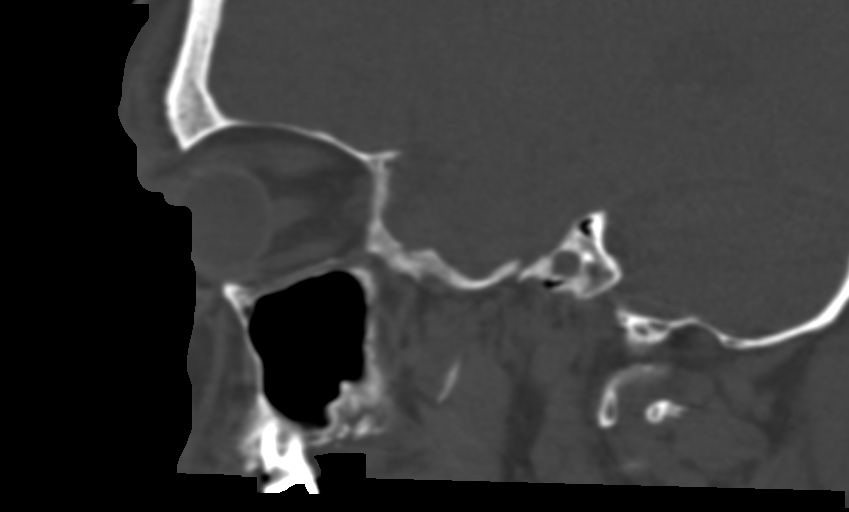
[im 58/87  bone]
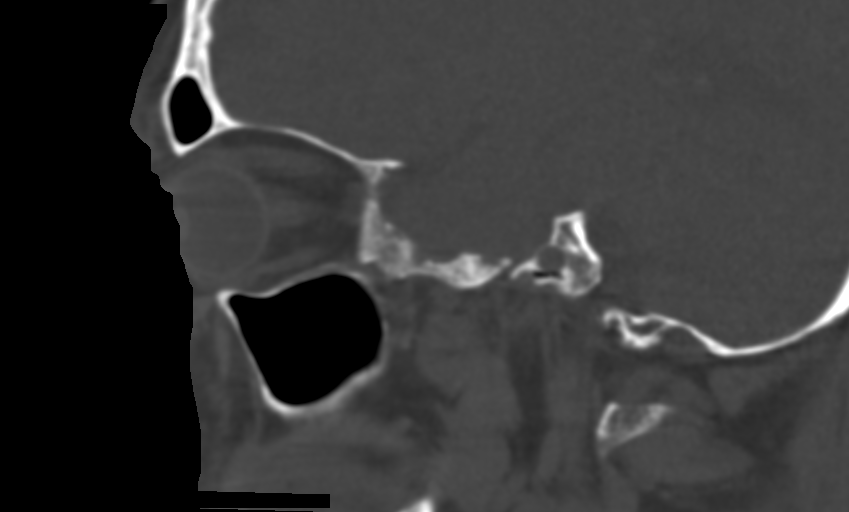

[14 of 47 positions shown; findings below may reference images not displayed]

FINDINGS: Orbits: No orbital mass is identified. Normal appearance of the
globes, optic nerve-sheath complexes, extraocular muscles, orbital
fat and lacrimal glands.

Visible paranasal sinuses: Clear.

Soft tissues: There is moderate to marked severity right-sided
preseptal and right supra orbital soft tissue swelling.

Osseous: No fracture or aggressive lesion.

Limited intracranial: No acute or significant finding.
IMPRESSION: Moderate to marked severity right-sided preseptal and right supra
orbital soft tissue swelling which may represent sequelae associated
with preorbital cellulitis. Ophthalmology consultation is
recommended.

## 2023-07-14 DIAGNOSIS — M6281 Muscle weakness (generalized): Secondary | ICD-10-CM | POA: Diagnosis not present

## 2023-07-14 DIAGNOSIS — I5033 Acute on chronic diastolic (congestive) heart failure: Secondary | ICD-10-CM | POA: Diagnosis not present

## 2023-07-15 DIAGNOSIS — I5042 Chronic combined systolic (congestive) and diastolic (congestive) heart failure: Secondary | ICD-10-CM | POA: Diagnosis not present

## 2023-07-15 DIAGNOSIS — I10 Essential (primary) hypertension: Secondary | ICD-10-CM | POA: Diagnosis not present

## 2023-07-16 DIAGNOSIS — I5033 Acute on chronic diastolic (congestive) heart failure: Secondary | ICD-10-CM | POA: Diagnosis not present

## 2023-07-16 DIAGNOSIS — M6281 Muscle weakness (generalized): Secondary | ICD-10-CM | POA: Diagnosis not present

## 2023-07-16 DIAGNOSIS — R2681 Unsteadiness on feet: Secondary | ICD-10-CM | POA: Diagnosis not present

## 2023-07-17 DIAGNOSIS — R2681 Unsteadiness on feet: Secondary | ICD-10-CM | POA: Diagnosis not present

## 2023-07-17 DIAGNOSIS — M6281 Muscle weakness (generalized): Secondary | ICD-10-CM | POA: Diagnosis not present

## 2023-07-17 DIAGNOSIS — I5033 Acute on chronic diastolic (congestive) heart failure: Secondary | ICD-10-CM | POA: Diagnosis not present

## 2023-07-18 DIAGNOSIS — I5033 Acute on chronic diastolic (congestive) heart failure: Secondary | ICD-10-CM | POA: Diagnosis not present

## 2023-07-18 DIAGNOSIS — M6281 Muscle weakness (generalized): Secondary | ICD-10-CM | POA: Diagnosis not present

## 2023-07-18 DIAGNOSIS — R2681 Unsteadiness on feet: Secondary | ICD-10-CM | POA: Diagnosis not present

## 2023-07-19 DIAGNOSIS — I5033 Acute on chronic diastolic (congestive) heart failure: Secondary | ICD-10-CM | POA: Diagnosis not present

## 2023-07-19 DIAGNOSIS — M6281 Muscle weakness (generalized): Secondary | ICD-10-CM | POA: Diagnosis not present

## 2023-07-19 DIAGNOSIS — R2681 Unsteadiness on feet: Secondary | ICD-10-CM | POA: Diagnosis not present

## 2023-07-20 DIAGNOSIS — L89896 Pressure-induced deep tissue damage of other site: Secondary | ICD-10-CM | POA: Diagnosis not present

## 2023-07-20 DIAGNOSIS — L89154 Pressure ulcer of sacral region, stage 4: Secondary | ICD-10-CM | POA: Diagnosis not present

## 2023-07-20 DIAGNOSIS — M6281 Muscle weakness (generalized): Secondary | ICD-10-CM | POA: Diagnosis not present

## 2023-07-20 DIAGNOSIS — S51011A Laceration without foreign body of right elbow, initial encounter: Secondary | ICD-10-CM | POA: Diagnosis not present

## 2023-07-20 DIAGNOSIS — R2681 Unsteadiness on feet: Secondary | ICD-10-CM | POA: Diagnosis not present

## 2023-07-20 DIAGNOSIS — I5033 Acute on chronic diastolic (congestive) heart failure: Secondary | ICD-10-CM | POA: Diagnosis not present

## 2023-07-21 DIAGNOSIS — R2681 Unsteadiness on feet: Secondary | ICD-10-CM | POA: Diagnosis not present

## 2023-07-21 DIAGNOSIS — M6281 Muscle weakness (generalized): Secondary | ICD-10-CM | POA: Diagnosis not present

## 2023-07-21 DIAGNOSIS — I5033 Acute on chronic diastolic (congestive) heart failure: Secondary | ICD-10-CM | POA: Diagnosis not present

## 2023-07-24 DIAGNOSIS — M6281 Muscle weakness (generalized): Secondary | ICD-10-CM | POA: Diagnosis not present

## 2023-07-24 DIAGNOSIS — R2681 Unsteadiness on feet: Secondary | ICD-10-CM | POA: Diagnosis not present

## 2023-07-24 DIAGNOSIS — I5033 Acute on chronic diastolic (congestive) heart failure: Secondary | ICD-10-CM | POA: Diagnosis not present

## 2023-07-25 DIAGNOSIS — M6281 Muscle weakness (generalized): Secondary | ICD-10-CM | POA: Diagnosis not present

## 2023-07-25 DIAGNOSIS — R2681 Unsteadiness on feet: Secondary | ICD-10-CM | POA: Diagnosis not present

## 2023-07-25 DIAGNOSIS — I5033 Acute on chronic diastolic (congestive) heart failure: Secondary | ICD-10-CM | POA: Diagnosis not present

## 2023-07-26 DIAGNOSIS — R2681 Unsteadiness on feet: Secondary | ICD-10-CM | POA: Diagnosis not present

## 2023-07-26 DIAGNOSIS — I5033 Acute on chronic diastolic (congestive) heart failure: Secondary | ICD-10-CM | POA: Diagnosis not present

## 2023-07-26 DIAGNOSIS — M6281 Muscle weakness (generalized): Secondary | ICD-10-CM | POA: Diagnosis not present

## 2023-07-27 DIAGNOSIS — R2681 Unsteadiness on feet: Secondary | ICD-10-CM | POA: Diagnosis not present

## 2023-07-27 DIAGNOSIS — E785 Hyperlipidemia, unspecified: Secondary | ICD-10-CM | POA: Diagnosis not present

## 2023-07-27 DIAGNOSIS — F70 Mild intellectual disabilities: Secondary | ICD-10-CM | POA: Diagnosis not present

## 2023-07-27 DIAGNOSIS — I5033 Acute on chronic diastolic (congestive) heart failure: Secondary | ICD-10-CM | POA: Diagnosis not present

## 2023-07-27 DIAGNOSIS — E119 Type 2 diabetes mellitus without complications: Secondary | ICD-10-CM | POA: Diagnosis not present

## 2023-07-27 DIAGNOSIS — M6281 Muscle weakness (generalized): Secondary | ICD-10-CM | POA: Diagnosis not present

## 2023-07-27 DIAGNOSIS — L89896 Pressure-induced deep tissue damage of other site: Secondary | ICD-10-CM | POA: Diagnosis not present

## 2023-07-28 DIAGNOSIS — R2681 Unsteadiness on feet: Secondary | ICD-10-CM | POA: Diagnosis not present

## 2023-07-28 DIAGNOSIS — I5033 Acute on chronic diastolic (congestive) heart failure: Secondary | ICD-10-CM | POA: Diagnosis not present

## 2023-07-28 DIAGNOSIS — M6281 Muscle weakness (generalized): Secondary | ICD-10-CM | POA: Diagnosis not present

## 2023-08-03 DIAGNOSIS — M6281 Muscle weakness (generalized): Secondary | ICD-10-CM | POA: Diagnosis not present

## 2023-08-03 DIAGNOSIS — I5033 Acute on chronic diastolic (congestive) heart failure: Secondary | ICD-10-CM | POA: Diagnosis not present

## 2023-08-03 DIAGNOSIS — L89154 Pressure ulcer of sacral region, stage 4: Secondary | ICD-10-CM | POA: Diagnosis not present

## 2023-08-03 DIAGNOSIS — R2681 Unsteadiness on feet: Secondary | ICD-10-CM | POA: Diagnosis not present

## 2023-08-04 DIAGNOSIS — M6281 Muscle weakness (generalized): Secondary | ICD-10-CM | POA: Diagnosis not present

## 2023-08-04 DIAGNOSIS — I5033 Acute on chronic diastolic (congestive) heart failure: Secondary | ICD-10-CM | POA: Diagnosis not present

## 2023-08-04 DIAGNOSIS — L89154 Pressure ulcer of sacral region, stage 4: Secondary | ICD-10-CM | POA: Diagnosis not present

## 2023-08-04 DIAGNOSIS — R2681 Unsteadiness on feet: Secondary | ICD-10-CM | POA: Diagnosis not present

## 2023-08-05 DIAGNOSIS — M6281 Muscle weakness (generalized): Secondary | ICD-10-CM | POA: Diagnosis not present

## 2023-08-05 DIAGNOSIS — R2681 Unsteadiness on feet: Secondary | ICD-10-CM | POA: Diagnosis not present

## 2023-08-05 DIAGNOSIS — I5033 Acute on chronic diastolic (congestive) heart failure: Secondary | ICD-10-CM | POA: Diagnosis not present

## 2023-08-08 DIAGNOSIS — M6281 Muscle weakness (generalized): Secondary | ICD-10-CM | POA: Diagnosis not present

## 2023-08-08 DIAGNOSIS — R2681 Unsteadiness on feet: Secondary | ICD-10-CM | POA: Diagnosis not present

## 2023-08-08 DIAGNOSIS — I5033 Acute on chronic diastolic (congestive) heart failure: Secondary | ICD-10-CM | POA: Diagnosis not present

## 2023-08-09 DIAGNOSIS — R2681 Unsteadiness on feet: Secondary | ICD-10-CM | POA: Diagnosis not present

## 2023-08-09 DIAGNOSIS — I5033 Acute on chronic diastolic (congestive) heart failure: Secondary | ICD-10-CM | POA: Diagnosis not present

## 2023-08-09 DIAGNOSIS — M6281 Muscle weakness (generalized): Secondary | ICD-10-CM | POA: Diagnosis not present

## 2023-08-10 DIAGNOSIS — E785 Hyperlipidemia, unspecified: Secondary | ICD-10-CM | POA: Diagnosis not present

## 2023-08-10 DIAGNOSIS — E119 Type 2 diabetes mellitus without complications: Secondary | ICD-10-CM | POA: Diagnosis not present

## 2023-08-10 DIAGNOSIS — I5033 Acute on chronic diastolic (congestive) heart failure: Secondary | ICD-10-CM | POA: Diagnosis not present

## 2023-08-10 DIAGNOSIS — M6281 Muscle weakness (generalized): Secondary | ICD-10-CM | POA: Diagnosis not present

## 2023-08-10 DIAGNOSIS — L89896 Pressure-induced deep tissue damage of other site: Secondary | ICD-10-CM | POA: Diagnosis not present

## 2023-08-10 DIAGNOSIS — L89154 Pressure ulcer of sacral region, stage 4: Secondary | ICD-10-CM | POA: Diagnosis not present

## 2023-08-10 DIAGNOSIS — R2681 Unsteadiness on feet: Secondary | ICD-10-CM | POA: Diagnosis not present

## 2023-08-11 DIAGNOSIS — R2681 Unsteadiness on feet: Secondary | ICD-10-CM | POA: Diagnosis not present

## 2023-08-11 DIAGNOSIS — M6281 Muscle weakness (generalized): Secondary | ICD-10-CM | POA: Diagnosis not present

## 2023-08-11 DIAGNOSIS — I5033 Acute on chronic diastolic (congestive) heart failure: Secondary | ICD-10-CM | POA: Diagnosis not present

## 2023-08-14 DIAGNOSIS — I5033 Acute on chronic diastolic (congestive) heart failure: Secondary | ICD-10-CM | POA: Diagnosis not present

## 2023-08-14 DIAGNOSIS — M6281 Muscle weakness (generalized): Secondary | ICD-10-CM | POA: Diagnosis not present

## 2023-08-14 DIAGNOSIS — R2681 Unsteadiness on feet: Secondary | ICD-10-CM | POA: Diagnosis not present

## 2023-08-15 DIAGNOSIS — M6281 Muscle weakness (generalized): Secondary | ICD-10-CM | POA: Diagnosis not present

## 2023-08-15 DIAGNOSIS — R2681 Unsteadiness on feet: Secondary | ICD-10-CM | POA: Diagnosis not present

## 2023-08-15 DIAGNOSIS — I5033 Acute on chronic diastolic (congestive) heart failure: Secondary | ICD-10-CM | POA: Diagnosis not present

## 2023-08-16 DIAGNOSIS — E119 Type 2 diabetes mellitus without complications: Secondary | ICD-10-CM | POA: Diagnosis not present

## 2023-08-16 DIAGNOSIS — R2681 Unsteadiness on feet: Secondary | ICD-10-CM | POA: Diagnosis not present

## 2023-08-16 DIAGNOSIS — J449 Chronic obstructive pulmonary disease, unspecified: Secondary | ICD-10-CM | POA: Diagnosis not present

## 2023-08-16 DIAGNOSIS — I5033 Acute on chronic diastolic (congestive) heart failure: Secondary | ICD-10-CM | POA: Diagnosis not present

## 2023-08-16 DIAGNOSIS — I509 Heart failure, unspecified: Secondary | ICD-10-CM | POA: Diagnosis not present

## 2023-08-16 DIAGNOSIS — R531 Weakness: Secondary | ICD-10-CM | POA: Diagnosis not present

## 2023-08-16 DIAGNOSIS — M6281 Muscle weakness (generalized): Secondary | ICD-10-CM | POA: Diagnosis not present

## 2023-08-17 DIAGNOSIS — R2681 Unsteadiness on feet: Secondary | ICD-10-CM | POA: Diagnosis not present

## 2023-08-17 DIAGNOSIS — M6281 Muscle weakness (generalized): Secondary | ICD-10-CM | POA: Diagnosis not present

## 2023-08-17 DIAGNOSIS — I5033 Acute on chronic diastolic (congestive) heart failure: Secondary | ICD-10-CM | POA: Diagnosis not present

## 2023-08-18 DIAGNOSIS — L89154 Pressure ulcer of sacral region, stage 4: Secondary | ICD-10-CM | POA: Diagnosis not present

## 2023-08-18 DIAGNOSIS — R2681 Unsteadiness on feet: Secondary | ICD-10-CM | POA: Diagnosis not present

## 2023-08-18 DIAGNOSIS — E785 Hyperlipidemia, unspecified: Secondary | ICD-10-CM | POA: Diagnosis not present

## 2023-08-18 DIAGNOSIS — M6281 Muscle weakness (generalized): Secondary | ICD-10-CM | POA: Diagnosis not present

## 2023-08-18 DIAGNOSIS — L89896 Pressure-induced deep tissue damage of other site: Secondary | ICD-10-CM | POA: Diagnosis not present

## 2023-08-18 DIAGNOSIS — E119 Type 2 diabetes mellitus without complications: Secondary | ICD-10-CM | POA: Diagnosis not present

## 2023-08-18 DIAGNOSIS — I5033 Acute on chronic diastolic (congestive) heart failure: Secondary | ICD-10-CM | POA: Diagnosis not present

## 2023-08-19 DIAGNOSIS — R2681 Unsteadiness on feet: Secondary | ICD-10-CM | POA: Diagnosis not present

## 2023-08-19 DIAGNOSIS — M6281 Muscle weakness (generalized): Secondary | ICD-10-CM | POA: Diagnosis not present

## 2023-08-19 DIAGNOSIS — I5033 Acute on chronic diastolic (congestive) heart failure: Secondary | ICD-10-CM | POA: Diagnosis not present

## 2023-08-21 DIAGNOSIS — I5033 Acute on chronic diastolic (congestive) heart failure: Secondary | ICD-10-CM | POA: Diagnosis not present

## 2023-08-21 DIAGNOSIS — R2681 Unsteadiness on feet: Secondary | ICD-10-CM | POA: Diagnosis not present

## 2023-08-21 DIAGNOSIS — M6281 Muscle weakness (generalized): Secondary | ICD-10-CM | POA: Diagnosis not present

## 2023-08-22 DIAGNOSIS — M6281 Muscle weakness (generalized): Secondary | ICD-10-CM | POA: Diagnosis not present

## 2023-08-22 DIAGNOSIS — I5033 Acute on chronic diastolic (congestive) heart failure: Secondary | ICD-10-CM | POA: Diagnosis not present

## 2023-08-22 DIAGNOSIS — R2681 Unsteadiness on feet: Secondary | ICD-10-CM | POA: Diagnosis not present

## 2023-08-23 DIAGNOSIS — R2681 Unsteadiness on feet: Secondary | ICD-10-CM | POA: Diagnosis not present

## 2023-08-23 DIAGNOSIS — M6281 Muscle weakness (generalized): Secondary | ICD-10-CM | POA: Diagnosis not present

## 2023-08-23 DIAGNOSIS — I5033 Acute on chronic diastolic (congestive) heart failure: Secondary | ICD-10-CM | POA: Diagnosis not present

## 2023-08-24 DIAGNOSIS — M6281 Muscle weakness (generalized): Secondary | ICD-10-CM | POA: Diagnosis not present

## 2023-08-24 DIAGNOSIS — I5033 Acute on chronic diastolic (congestive) heart failure: Secondary | ICD-10-CM | POA: Diagnosis not present

## 2023-08-24 DIAGNOSIS — R2681 Unsteadiness on feet: Secondary | ICD-10-CM | POA: Diagnosis not present

## 2023-08-25 DIAGNOSIS — R2681 Unsteadiness on feet: Secondary | ICD-10-CM | POA: Diagnosis not present

## 2023-08-25 DIAGNOSIS — I5033 Acute on chronic diastolic (congestive) heart failure: Secondary | ICD-10-CM | POA: Diagnosis not present

## 2023-08-25 DIAGNOSIS — M6281 Muscle weakness (generalized): Secondary | ICD-10-CM | POA: Diagnosis not present

## 2023-08-28 DIAGNOSIS — R2681 Unsteadiness on feet: Secondary | ICD-10-CM | POA: Diagnosis not present

## 2023-08-28 DIAGNOSIS — I5033 Acute on chronic diastolic (congestive) heart failure: Secondary | ICD-10-CM | POA: Diagnosis not present

## 2023-08-28 DIAGNOSIS — M6281 Muscle weakness (generalized): Secondary | ICD-10-CM | POA: Diagnosis not present

## 2023-08-29 DIAGNOSIS — R2681 Unsteadiness on feet: Secondary | ICD-10-CM | POA: Diagnosis not present

## 2023-08-29 DIAGNOSIS — I5033 Acute on chronic diastolic (congestive) heart failure: Secondary | ICD-10-CM | POA: Diagnosis not present

## 2023-08-29 DIAGNOSIS — M6281 Muscle weakness (generalized): Secondary | ICD-10-CM | POA: Diagnosis not present

## 2023-08-30 DIAGNOSIS — I5033 Acute on chronic diastolic (congestive) heart failure: Secondary | ICD-10-CM | POA: Diagnosis not present

## 2023-08-30 DIAGNOSIS — M6281 Muscle weakness (generalized): Secondary | ICD-10-CM | POA: Diagnosis not present

## 2023-08-30 DIAGNOSIS — R2681 Unsteadiness on feet: Secondary | ICD-10-CM | POA: Diagnosis not present

## 2023-08-31 DIAGNOSIS — I10 Essential (primary) hypertension: Secondary | ICD-10-CM | POA: Diagnosis not present

## 2023-08-31 DIAGNOSIS — R2681 Unsteadiness on feet: Secondary | ICD-10-CM | POA: Diagnosis not present

## 2023-08-31 DIAGNOSIS — I5033 Acute on chronic diastolic (congestive) heart failure: Secondary | ICD-10-CM | POA: Diagnosis not present

## 2023-08-31 DIAGNOSIS — I5042 Chronic combined systolic (congestive) and diastolic (congestive) heart failure: Secondary | ICD-10-CM | POA: Diagnosis not present

## 2023-08-31 DIAGNOSIS — I498 Other specified cardiac arrhythmias: Secondary | ICD-10-CM | POA: Diagnosis not present

## 2023-08-31 DIAGNOSIS — M6281 Muscle weakness (generalized): Secondary | ICD-10-CM | POA: Diagnosis not present

## 2023-08-31 DIAGNOSIS — I483 Typical atrial flutter: Secondary | ICD-10-CM | POA: Diagnosis not present

## 2023-09-01 DIAGNOSIS — M6281 Muscle weakness (generalized): Secondary | ICD-10-CM | POA: Diagnosis not present

## 2023-09-01 DIAGNOSIS — I5033 Acute on chronic diastolic (congestive) heart failure: Secondary | ICD-10-CM | POA: Diagnosis not present

## 2023-09-01 DIAGNOSIS — R2681 Unsteadiness on feet: Secondary | ICD-10-CM | POA: Diagnosis not present

## 2023-09-02 DIAGNOSIS — I498 Other specified cardiac arrhythmias: Secondary | ICD-10-CM | POA: Diagnosis not present

## 2023-09-03 DIAGNOSIS — I5033 Acute on chronic diastolic (congestive) heart failure: Secondary | ICD-10-CM | POA: Diagnosis not present

## 2023-09-03 DIAGNOSIS — M6281 Muscle weakness (generalized): Secondary | ICD-10-CM | POA: Diagnosis not present

## 2023-09-03 DIAGNOSIS — R2681 Unsteadiness on feet: Secondary | ICD-10-CM | POA: Diagnosis not present

## 2023-09-08 ENCOUNTER — Other Ambulatory Visit: Payer: Self-pay

## 2023-09-08 DIAGNOSIS — Z760 Encounter for issue of repeat prescription: Secondary | ICD-10-CM

## 2023-09-08 MED ORDER — NOVOFINE PEN NEEDLE 32G X 6 MM MISC
5 refills | Status: DC
Start: 2023-09-08 — End: 2023-12-18

## 2023-09-18 ENCOUNTER — Telehealth: Payer: Self-pay | Admitting: Nurse Practitioner

## 2023-09-18 ENCOUNTER — Ambulatory Visit: Admitting: Nurse Practitioner

## 2023-09-18 ENCOUNTER — Encounter: Payer: Self-pay | Admitting: Nurse Practitioner

## 2023-09-18 VITALS — BP 115/60 | HR 81 | Temp 98.2°F | Resp 16 | Ht 71.0 in | Wt 179.4 lb

## 2023-09-18 DIAGNOSIS — G4733 Obstructive sleep apnea (adult) (pediatric): Secondary | ICD-10-CM

## 2023-09-18 DIAGNOSIS — K219 Gastro-esophageal reflux disease without esophagitis: Secondary | ICD-10-CM

## 2023-09-18 DIAGNOSIS — I483 Typical atrial flutter: Secondary | ICD-10-CM | POA: Diagnosis not present

## 2023-09-18 DIAGNOSIS — E1122 Type 2 diabetes mellitus with diabetic chronic kidney disease: Secondary | ICD-10-CM | POA: Diagnosis not present

## 2023-09-18 DIAGNOSIS — E785 Hyperlipidemia, unspecified: Secondary | ICD-10-CM

## 2023-09-18 DIAGNOSIS — J449 Chronic obstructive pulmonary disease, unspecified: Secondary | ICD-10-CM

## 2023-09-18 DIAGNOSIS — Z794 Long term (current) use of insulin: Secondary | ICD-10-CM

## 2023-09-18 DIAGNOSIS — Z760 Encounter for issue of repeat prescription: Secondary | ICD-10-CM

## 2023-09-18 DIAGNOSIS — N184 Chronic kidney disease, stage 4 (severe): Secondary | ICD-10-CM

## 2023-09-18 DIAGNOSIS — E1169 Type 2 diabetes mellitus with other specified complication: Secondary | ICD-10-CM

## 2023-09-18 DIAGNOSIS — M25512 Pain in left shoulder: Secondary | ICD-10-CM

## 2023-09-18 DIAGNOSIS — E1159 Type 2 diabetes mellitus with other circulatory complications: Secondary | ICD-10-CM

## 2023-09-18 DIAGNOSIS — M25561 Pain in right knee: Secondary | ICD-10-CM

## 2023-09-18 DIAGNOSIS — G8929 Other chronic pain: Secondary | ICD-10-CM

## 2023-09-18 DIAGNOSIS — M25562 Pain in left knee: Secondary | ICD-10-CM

## 2023-09-18 DIAGNOSIS — I152 Hypertension secondary to endocrine disorders: Secondary | ICD-10-CM

## 2023-09-18 LAB — POCT GLYCOSYLATED HEMOGLOBIN (HGB A1C): Hemoglobin A1C: 6.7 % — AB (ref 4.0–5.6)

## 2023-09-18 MED ORDER — TOUJEO MAX SOLOSTAR 300 UNIT/ML ~~LOC~~ SOPN: 16.0000 [IU] | PEN_INJECTOR | Freq: Every day | SUBCUTANEOUS | 3 refills | Status: DC

## 2023-09-18 MED ORDER — OMEPRAZOLE 40 MG PO CPDR
40.0000 mg | DELAYED_RELEASE_CAPSULE | Freq: Two times a day (BID) | ORAL | 3 refills | Status: DC
Start: 2023-09-18 — End: 2023-10-17

## 2023-09-18 MED ORDER — ROSUVASTATIN CALCIUM 10 MG PO TABS: 10.0000 mg | ORAL_TABLET | Freq: Every day | ORAL | 1 refills | Status: AC

## 2023-09-18 NOTE — Progress Notes (Signed)
 [facility] [address] [place], [place] [address]  Internal MEDICINE  Office Visit Note  Patient Name: Larry Callahan  [id number]  [id number]  Date of Service: 09/18/2023  Chief Complaint  Patient presents with   Diabetes   Gastroesophageal Reflux   Hyperlipidemia   Hypertension   Follow-up    HPI Larry Callahan presents for a follow-up visit for diabetes, OSA, weight loss and refills. Diabetes -- A1c slightly increased at 6.7.  History of stroke Back to back hospital admissions in November/December then transferred to SNF for rehab High cholesterol -- taking rosuvastatin  OSA -- has had sleep apnea and been on CPAP for a long time but has lost over 100 lbs in the past year and needs a new sleep study to determine if he still needs to use a CPAP machine. He reports that his sleep has improved. He does not feel like he needs the CPAP machine. He is not waking up in the middle of the night like he used to. He wakes up feeling refreshed in the morning.    Current Medication: Outpatient Encounter Medications as of 09/18/2023  Medication Sig   ACCU-CHEK GUIDE test strip Use to check blood sugars 4 times a day E11.65   Accu-Chek Softclix Lancets lancets Use to check blood sugars 4 times a day   E11.65   acetaminophen  (TYLENOL ) 325 MG tablet Take 2 tablets (650 mg total) by mouth every 6 (six) hours as needed for mild pain or moderate pain (or Fever >/= 101).   acetaminophen -codeine  (TYLENOL  #3) 300-30 MG tablet Take one tab po at night for left shoulder pain   alum & mag hydroxide-simeth (MAALOX/MYLANTA) 200-200-20 MG/5ML suspension Take 30 mLs by mouth every 4 (four) hours as needed for indigestion or heartburn.   apixaban  (ELIQUIS ) 5 MG TABS tablet Take 1 tablet (5 mg total) by mouth 2 (two) times daily.   bisacodyl  (DULCOLAX) 5 MG EC tablet Take 2 tablets (10 mg total) by mouth daily as needed for moderate constipation.   Blood Glucose Monitoring Suppl (TRUE METRIX AIR  GLUCOSE METER) DEVI 1 Device by Does not apply route 2 (two) times daily.   Budeson-Glycopyrrol-Formoterol  (BREZTRI  AEROSPHERE) 160-9-4.8 MCG/ACT AERO Inhale 2 puffs into the lungs 2 (two) times daily. (Patient taking differently: Inhale 2 puffs into the lungs 2 (two) times daily as needed.)   carvedilol  (COREG ) 12.5 MG tablet Take 1 tablet (12.5 mg total) by mouth 2 (two) times daily.   cyanocobalamin  1000 MCG tablet Take 1 tablet (1,000 mcg total) by mouth daily.   diclofenac  Sodium (VOLTAREN  ARTHRITIS PAIN) 1 % GEL Apply to left arm and shoulder twice daily as needed for pain   furosemide  (LASIX ) 40 MG tablet Take 1 tablet (40 mg total) by mouth daily.   glimepiride  (AMARYL ) 2 MG tablet Take 1 tablet (2 mg total) by mouth daily.   hydrALAZINE  (APRESOLINE ) 100 MG tablet Take 1 tablet (100 mg total) by mouth 3 (three) times daily.   Insulin  Pen Needle (NOVOFINE PEN NEEDLE) 32G X 6 MM MISC Use as directed with toujeo  pen   ipratropium-albuterol  (DUONEB) 0.5-2.5 (3) MG/3ML SOLN Take 3 mLs by nebulization every 6 (six) hours as needed.   methocarbamol  (ROBAXIN ) 750 MG tablet Take 1 tablet (750 mg total) by mouth every 6 (six) hours as needed for muscle spasms.   ondansetron  (ZOFRAN -ODT) 4 MG disintegrating tablet Take 1 tablet (4 mg total) by mouth every 6 (six) hours as needed for nausea or vomiting.  polyethylene glycol (MIRALAX ) 17 g packet Take 17 g by mouth daily.   potassium chloride  SA (KLOR-CON  M) 20 MEQ tablet Take 1 tablet (20 mEq total) by mouth 2 (two) times daily.   [DISCONTINUED] insulin  glargine, 2 Unit Dial , (TOUJEO  MAX SOLOSTAR) 300 UNIT/ML Solostar Pen Inject 20 units in am   [DISCONTINUED] omeprazole  (PRILOSEC) 40 MG capsule Take 1 capsule once in the morning and once in the evening   [DISCONTINUED] rosuvastatin  (CRESTOR ) 10 MG tablet Take 1 tablet (10 mg total) by mouth at bedtime.   [DISCONTINUED] TOUJEO  SOLOSTAR 300 UNIT/ML Solostar Pen Inject 26 Units into the skin daily.    insulin  glargine, 2 Unit Dial , (TOUJEO  MAX SOLOSTAR) 300 UNIT/ML Solostar Pen Inject 16 Units into the skin at bedtime. Inject 20 units in am   iron  polysaccharides (NIFEREX) 150 MG capsule Take 1 capsule (150 mg total) by mouth daily.   omeprazole  (PRILOSEC) 40 MG capsule Take 1 capsule (40 mg total) by mouth in the morning and at bedtime.   rosuvastatin  (CRESTOR ) 10 MG tablet Take 1 tablet (10 mg total) by mouth at bedtime.   No facility-administered encounter medications on file as of 09/18/2023.    Surgical History: Past Surgical History:  Procedure Laterality Date   CARDIAC CATHETERIZATION     CHOLECYSTECTOMY     COLONOSCOPY WITH PROPOFOL  N/A 04/12/2019   Procedure: COLONOSCOPY WITH PROPOFOL ;  Surgeon: Selena Daily, MD;  Location[facility] ENDOSCOPY;  Service: Gastroenterology;  Laterality: N/A;   ESOPHAGOGASTRODUODENOSCOPY N/A 04/12/2019   Procedure: ESOPHAGOGASTRODUODENOSCOPY (EGD);  Surgeon: Selena Daily, MD;  Location: [facility] ENDOSCOPY;  Service: Gastroenterology;  Laterality: N/A;   ESOPHAGOGASTRODUODENOSCOPY (EGD) WITH PROPOFOL  N/A 11/21/2019   Procedure: ESOPHAGOGASTRODUODENOSCOPY (EGD) WITH PROPOFOL ;  Surgeon: Selena Daily, MD;  Location: [name] ENDOSCOPY;  Service: Gastroenterology;  Laterality: N/A;   ESOPHAGOGASTRODUODENOSCOPY (EGD) WITH PROPOFOL  N/A 01/11/2022   Procedure: ESOPHAGOGASTRODUODENOSCOPY (EGD) WITH PROPOFOL ;  Surgeon: Marnee Sink, MD;  Location: [name] ENDOSCOPY;  Service: Endoscopy;  Laterality: N/A;   ESOPHAGOGASTRODUODENOSCOPY (EGD) WITH PROPOFOL  N/A 08/02/2022   Procedure: ESOPHAGOGASTRODUODENOSCOPY (EGD) WITH PROPOFOL ;  Surgeon: Marnee Sink, MD;  Location: [name] ENDOSCOPY;  Service: Endoscopy;  Laterality: N/A;   FLEXIBLE BRONCHOSCOPY Bilateral 05/17/2019   Procedure: FLEXIBLE BRONCHOSCOPY;  Surgeon: Cordie Deters, MD;  Location: [name] ORS;  Service: Pulmonary;  Laterality: Bilateral;   left arm surgery     nephrectomy Left    PARATHYROIDECTOMY      RIGHT HEART CATH N/A 03/29/2019   Procedure: RIGHT HEART CATH;  Surgeon: Devorah Fonder, MD;  Location: [name] INVASIVE CV LAB;  Service: Cardiovascular;  Laterality: N/A;    Medical History: Past Medical History:  Diagnosis Date   Allergy    Atrial flutter (HCC)    a. Dx 12/2020--CHA2DS2VASc = 5-->eliquis .   Chronic combined systolic (congestive) and diastolic (congestive) heart failure (HCC)    a. 04/2019 Echo: EF 45-50%; b. 02/2019 Echo: EF 55-60%; c. 09/2020 Echo: EF 35-40%, glob HK. Nl RV size/fxn. Mild MR; d. 07/2021 Echo: EF 40-45%, mild LVH, low-nl RV fxn, no MR.   CKD (chronic kidney disease), stage III (HCC)    COPD (chronic obstructive pulmonary disease) (HCC)    Coronary artery disease    a. 2011 Cath: nonobs dzs; b. 09/2020 MV: EF 38%. No signif ischemia. ? inf MI vs diaph attenuation. GI uptake artifact.   Diabetes mellitus without complication (HCC)    GERD (gastroesophageal reflux disease)    History of CVA (cerebrovascular accident) 09/25/2020   Hyperlipidemia  Hypertension    Morbid (severe) obesity due to excess calories (HCC) 06/09/2017   Morbid obesity (HCC)    NICM (nonischemic cardiomyopathy) (HCC)    a. 04/2019 Echo: EF 45-50%; b. 02/2019 Echo: EF 55-60%; c. 09/2020 Echo: EF 35-40%, glob HK; d. 09/2020 MV: No ischemia. ? inf infarct vs attenuation; d. 07/2021 Echo: EF 40-45%.   OSA (obstructive sleep apnea)    Pneumonia due to COVID-19 virus 01/30/2020   Stage 3b chronic kidney disease (HCC)    Syncope and collapse 11/16/2020    Family History: Family History  Problem Relation Age of Onset   Diabetes Mother        2003   Lung cancer Father    Diabetes Sister    Hypertension Sister    Heart disease Sister    Cancer Sister    Bone cancer Brother    Prostate cancer Neg Hx    Bladder Cancer Neg Hx    Kidney cancer Neg Hx     Social History   Socioeconomic History   Marital status: Single    Spouse name: Not on file   Number of children: Not on file    Years of education: Not on file   Highest education level: Not on file  Occupational History   Not on file  Tobacco Use   Smoking status: Former    Current packs/day: 0.00    Types: Cigarettes    Quit date: 03/13/2020    Years since quitting: 3.5   Smokeless tobacco: Former    Types: Engineer, drilling   Vaping status: Never Used  Substance and Sexual Activity   Alcohol use: No   Drug use: No   Sexual activity: Not Currently  Other Topics Concern   Not on file  Social History Narrative   Not on file   Social Drivers of Health   Financial Resource Strain: Low Risk  (05/12/2021)   Overall Financial Resource Strain (CARDIA)    Difficulty of Paying Living Expenses: Not very hard  Food Insecurity: No Food Insecurity (05/15/2023)   Received from [facility]   Hunger Vital Sign    Worried About Running Out of Food in the Last Year: Never true    Ran Out of Food in the Last Year: Never true  Transportation Needs: No Transportation Needs (05/15/2023)   Received from [facility]   PRAPARE - Transportation    Lack of Transportation (Medical): No    Lack of Transportation (Non-Medical): No  Physical Activity: Not on file  Stress: Not on file  Social Connections: Not on file  Intimate Partner Violence: Not At Risk (08/01/2022)   Humiliation, Afraid, Rape, and Kick questionnaire    Fear of Current or Ex-Partner: No    Emotionally Abused: No    Physically Abused: No    Sexually Abused: No      Review of Systems  Constitutional:  Negative for chills, fatigue and unexpected weight change.  HENT:  Negative for congestion, postnasal drip, rhinorrhea, sneezing and sore throat.   Eyes:  Negative for redness.  Respiratory: Negative.  Negative for cough, chest tightness, shortness of breath and wheezing.   Cardiovascular: Negative.  Negative for chest pain and palpitations.  Gastrointestinal:  Negative for abdominal pain, constipation, diarrhea, nausea and vomiting.   Genitourinary:  Negative for dysuria and frequency.  Musculoskeletal:  Positive for arthralgias (bilateral knee pain and right shoulder pain), gait problem, joint swelling and myalgias. Negative for back pain and neck pain.  Skin:  Negative for rash.  Neurological:  Negative for tremors and numbness.  Hematological:  Negative for adenopathy. Does not bruise/bleed easily.  Psychiatric/Behavioral:  Negative for behavioral problems (Depression), sleep disturbance and suicidal ideas. The patient is not nervous/anxious.     Vital Signs: BP 115/60 Comment: 140/84  Pulse 81   Temp 98.2 F (36.8 C)   Resp 16   Ht 5\' 11"  (1.803 m)   Wt 179 lb 6.4 oz (81.4 kg)   SpO2 96%   BMI 25.02 kg/m    Physical Exam Vitals reviewed.  Constitutional:      Appearance: Normal appearance. He is obese.  HENT:     Head: Normocephalic and atraumatic.  Eyes:     Pupils: Pupils are equal, round, and reactive to light.  Cardiovascular:     Rate and Rhythm: Normal rate and regular rhythm.  Pulmonary:     Effort: Pulmonary effort is normal. No respiratory distress.  Musculoskeletal:     Right knee: Swelling present. Decreased range of motion. Tenderness present.     Left knee: Swelling present. Decreased range of motion. Tenderness present.  Neurological:     Mental Status: He is alert and oriented to person, place, and time.  Psychiatric:        Mood and Affect: Mood normal.        Behavior: Behavior normal.        Assessment/Plan: 1. Type 2 diabetes mellitus with stage 4 chronic kidney disease, with long-term current use of insulin  (HCC) (Primary) Continue toujeo  insulin  at 16 units daily. A1c is stable  - POCT glycosylated hemoglobin (Hb A1C) - insulin  glargine, 2 Unit Dial , (TOUJEO  MAX SOLOSTAR) 300 UNIT/ML Solostar Pen; Inject 16 Units into the skin at bedtime. Inject 20 units in am  Dispense: 9 mL; Refill: 3  2. Hypertension associated with type 2 diabetes mellitus (HCC) Stable, continue  carvedilol , furosemide , hydralazine  and potassium as prescribed.  - carvedilol  (COREG ) 12.5 MG tablet; Take 1 tablet (12.5 mg total) by mouth 2 (two) times daily.  Dispense: 60 tablet; Refill: 1 - furosemide  (LASIX ) 40 MG tablet; Take 1 tablet (40 mg total) by mouth daily.  Dispense: 90 tablet; Refill: 3 - hydrALAZINE  (APRESOLINE ) 100 MG tablet; Take 1 tablet (100 mg total) by mouth 3 (three) times daily.  Dispense: 270 tablet; Refill: 0 - potassium chloride  SA (KLOR-CON  M) 20 MEQ tablet; Take 1 tablet (20 mEq total) by mouth 2 (two) times daily.  Dispense: 90 tablet; Refill: 0  3. Hyperlipidemia associated with type 2 diabetes mellitus (HCC) Continue rosuvastatin  as prescribed.  - rosuvastatin  (CRESTOR ) 10 MG tablet; Take 1 tablet (10 mg total) by mouth at bedtime.  Dispense: 90 tablet; Refill: 1  4. Typical atrial flutter (HCC) Continue eliquis  as prescribed  - apixaban  (ELIQUIS ) 5 MG TABS tablet; Take 1 tablet (5 mg total) by mouth 2 (two) times daily.  Dispense: 60 tablet; Refill: 5  5. Obstructive sleep apnea, adult Sleep study ordered to reevaluate need for CPAP machine, patient has lost more than 100 lbs.  - [facility] Sleep Study; Future  6. Chronic obstructive pulmonary disease, unspecified COPD type (HCC) Continue breztri  and duoneb treatments as prescribed.  - budeson-glycopyrrolate-formoterol  (BREZTRI  AEROSPHERE) 160-9-4.8 MCG/ACT AERO inhaler; Inhale 2 puffs into the lungs 2 (two) times daily.  Dispense: 2 each; Refill: 3 - ipratropium-albuterol  (DUONEB) 0.5-2.5 (3) MG/3ML SOLN; Take 3 mLs by nebulization every 6 (six) hours as needed.  Dispense: 270 mL; Refill: 0  7. Gastroesophageal reflux disease  without esophagitis Continue omeprazole  as prescribed  - omeprazole  (PRILOSEC) 40 MG capsule; Take 1 capsule (40 mg total) by mouth in the morning and at bedtime.  Dispense: 180 capsule; Refill: 3  8. Chronic left shoulder pain Continue methocarbamol  as prescribed.  - methocarbamol   (ROBAXIN ) 750 MG tablet; Take 1 tablet (750 mg total) by mouth every 6 (six) hours as needed for muscle spasms.  Dispense: 120 tablet; Refill: 1  9. Bilateral chronic knee pain Continue methocarbamol  as prescribed  - methocarbamol  (ROBAXIN ) 750 MG tablet; Take 1 tablet (750 mg total) by mouth every 6 (six) hours as needed for muscle spasms.  Dispense: 120 tablet; Refill: 1  10. Medication refill Medication list reviewed, updated and refills ordered  - cyanocobalamin  1000 MCG tablet; Take 1 tablet (1,000 mcg total) by mouth daily.  Dispense: 90 tablet; Refill: 1   General Counseling: Cameo verbalizes understanding of the findings of todays visit and agrees with plan of treatment. I have discussed any further diagnostic evaluation that may be needed or ordered today. We also reviewed his medications today. he has been encouraged to call the office with any questions or concerns that should arise related to todays visit.    Orders Placed This Encounter  Procedures   POCT glycosylated hemoglobin (Hb A1C)    Meds ordered this encounter  Medications   insulin  glargine, 2 Unit Dial , (TOUJEO  MAX SOLOSTAR) 300 UNIT/ML Solostar Pen    Sig: Inject 16 Units into the skin at bedtime. Inject 20 units in am    Dispense:  9 mL    Refill:  3    Note change in dose, fill new script today   omeprazole  (PRILOSEC) 40 MG capsule    Sig: Take 1 capsule (40 mg total) by mouth in the morning and at bedtime.    Dispense:  180 capsule    Refill:  3    For future refills   rosuvastatin  (CRESTOR ) 10 MG tablet    Sig: Take 1 tablet (10 mg total) by mouth at bedtime.    Dispense:  90 tablet    Refill:  1    Return in about 1 month (around 10/18/2023) for F/U, Siddhanth Denk PCP insulin  dose adjustment. .   Total time spent:30 Minutes Time spent includes review of chart, medications, test results, and follow up plan with the patient.   [place] Controlled Substance Database was reviewed by me.  This patient was seen  by Laurence Pons, FNP-C in collaboration with Dr. Verneta Gone as a part of collaborative care agreement.   Tannor Pyon R. Bobbi Burow, MSN, FNP-C Internal medicine

## 2023-09-18 NOTE — Telephone Encounter (Signed)
 Awaiting 09/18/23 office notes for SS order-[name]

## 2023-10-03 ENCOUNTER — Other Ambulatory Visit: Payer: Self-pay

## 2023-10-05 MED ORDER — METHOCARBAMOL 750 MG PO TABS: 750.0000 mg | ORAL_TABLET | Freq: Four times a day (QID) | ORAL | 1 refills | Status: DC | PRN

## 2023-10-05 MED ORDER — CARVEDILOL 12.5 MG PO TABS: 12.5000 mg | ORAL_TABLET | Freq: Two times a day (BID) | ORAL | 1 refills | Status: DC

## 2023-10-05 MED ORDER — HYDRALAZINE HCL 100 MG PO TABS: 100.0000 mg | ORAL_TABLET | Freq: Three times a day (TID) | ORAL | 0 refills | Status: DC

## 2023-10-05 MED ORDER — APIXABAN 5 MG PO TABS: 5.0000 mg | ORAL_TABLET | Freq: Two times a day (BID) | ORAL | 5 refills | Status: AC

## 2023-10-05 MED ORDER — CYANOCOBALAMIN 1000 MCG PO TABS: 1000.0000 ug | ORAL_TABLET | Freq: Every day | ORAL | 1 refills | Status: DC

## 2023-10-05 MED ORDER — POTASSIUM CHLORIDE CRYS ER 20 MEQ PO TBCR: 20.0000 meq | EXTENDED_RELEASE_TABLET | Freq: Two times a day (BID) | ORAL | 0 refills | Status: DC

## 2023-10-05 MED ORDER — IPRATROPIUM-ALBUTEROL 0.5-2.5 (3) MG/3ML IN SOLN: 3.0000 mL | Freq: Four times a day (QID) | RESPIRATORY_TRACT | 0 refills | Status: DC | PRN

## 2023-10-05 MED ORDER — FUROSEMIDE 40 MG PO TABS: 40.0000 mg | ORAL_TABLET | Freq: Every day | ORAL | 3 refills | Status: DC

## 2023-10-05 MED ORDER — BREZTRI AEROSPHERE 160-9-4.8 MCG/ACT IN AERO: 2.0000 | INHALATION_SPRAY | Freq: Two times a day (BID) | RESPIRATORY_TRACT | 3 refills | Status: DC

## 2023-10-10 ENCOUNTER — Other Ambulatory Visit: Payer: Self-pay

## 2023-10-10 ENCOUNTER — Observation Stay
Admission: EM | Admit: 2023-10-10 | Discharge: 2023-10-12 | Disposition: A | Discharge status: Home / Self Care | Attending: Osteopathic Medicine | Admitting: Osteopathic Medicine

## 2023-10-10 ENCOUNTER — Emergency Department

## 2023-10-10 ENCOUNTER — Encounter: Payer: Self-pay | Admitting: Nurse Practitioner

## 2023-10-10 ENCOUNTER — Telehealth: Payer: Self-pay | Admitting: Nurse Practitioner

## 2023-10-10 DIAGNOSIS — E872 Acidosis, unspecified: Secondary | ICD-10-CM | POA: Insufficient documentation

## 2023-10-10 DIAGNOSIS — N184 Chronic kidney disease, stage 4 (severe): Secondary | ICD-10-CM | POA: Diagnosis not present

## 2023-10-10 DIAGNOSIS — I5022 Chronic systolic (congestive) heart failure: Secondary | ICD-10-CM | POA: Insufficient documentation

## 2023-10-10 DIAGNOSIS — I2510 Atherosclerotic heart disease of native coronary artery without angina pectoris: Secondary | ICD-10-CM | POA: Insufficient documentation

## 2023-10-10 DIAGNOSIS — J441 Chronic obstructive pulmonary disease with (acute) exacerbation: Principal | ICD-10-CM | POA: Insufficient documentation

## 2023-10-10 DIAGNOSIS — I130 Hypertensive heart and chronic kidney disease with heart failure and stage 1 through stage 4 chronic kidney disease, or unspecified chronic kidney disease: Secondary | ICD-10-CM | POA: Diagnosis not present

## 2023-10-10 DIAGNOSIS — J209 Acute bronchitis, unspecified: Secondary | ICD-10-CM | POA: Insufficient documentation

## 2023-10-10 DIAGNOSIS — N179 Acute kidney failure, unspecified: Secondary | ICD-10-CM | POA: Diagnosis not present

## 2023-10-10 DIAGNOSIS — Z794 Long term (current) use of insulin: Secondary | ICD-10-CM

## 2023-10-10 DIAGNOSIS — E663 Overweight: Secondary | ICD-10-CM | POA: Insufficient documentation

## 2023-10-10 DIAGNOSIS — G4733 Obstructive sleep apnea (adult) (pediatric): Secondary | ICD-10-CM | POA: Insufficient documentation

## 2023-10-10 DIAGNOSIS — K5900 Constipation, unspecified: Secondary | ICD-10-CM | POA: Diagnosis not present

## 2023-10-10 DIAGNOSIS — R051 Acute cough: Secondary | ICD-10-CM

## 2023-10-10 DIAGNOSIS — J189 Pneumonia, unspecified organism: Principal | ICD-10-CM

## 2023-10-10 DIAGNOSIS — E119 Type 2 diabetes mellitus without complications: Secondary | ICD-10-CM

## 2023-10-10 DIAGNOSIS — R0602 Shortness of breath: Secondary | ICD-10-CM

## 2023-10-10 DIAGNOSIS — R079 Chest pain, unspecified: Secondary | ICD-10-CM | POA: Diagnosis present

## 2023-10-10 DIAGNOSIS — E1122 Type 2 diabetes mellitus with diabetic chronic kidney disease: Secondary | ICD-10-CM | POA: Insufficient documentation

## 2023-10-10 DIAGNOSIS — R791 Abnormal coagulation profile: Secondary | ICD-10-CM | POA: Diagnosis not present

## 2023-10-10 DIAGNOSIS — I4892 Unspecified atrial flutter: Secondary | ICD-10-CM | POA: Diagnosis not present

## 2023-10-10 DIAGNOSIS — I10 Essential (primary) hypertension: Secondary | ICD-10-CM | POA: Diagnosis present

## 2023-10-10 DIAGNOSIS — J449 Chronic obstructive pulmonary disease, unspecified: Secondary | ICD-10-CM

## 2023-10-10 DIAGNOSIS — Z6825 Body mass index (BMI) 25.0-25.9, adult: Secondary | ICD-10-CM | POA: Insufficient documentation

## 2023-10-10 DIAGNOSIS — Z1152 Encounter for screening for COVID-19: Secondary | ICD-10-CM | POA: Insufficient documentation

## 2023-10-10 DIAGNOSIS — R0781 Pleurodynia: Secondary | ICD-10-CM | POA: Diagnosis present

## 2023-10-10 LAB — CBC
HCT: 30.8 % — ABNORMAL LOW (ref 39.0–52.0)
Hemoglobin: 9.7 g/dL — ABNORMAL LOW (ref 13.0–17.0)
MCH: 29 pg (ref 26.0–34.0)
MCHC: 31.5 g/dL (ref 30.0–36.0)
MCV: 91.9 fL (ref 80.0–100.0)
Platelets: 212 10*3/uL (ref 150–400)
RBC: 3.35 MIL/uL — ABNORMAL LOW (ref 4.22–5.81)
RDW: 16.2 % — ABNORMAL HIGH (ref 11.5–15.5)
WBC: 5.9 10*3/uL (ref 4.0–10.5)
nRBC: 0 % (ref 0.0–0.2)

## 2023-10-10 LAB — BASIC METABOLIC PANEL WITH GFR
Anion gap: 14 (ref 5–15)
BUN: 96 mg/dL — ABNORMAL HIGH (ref 8–23)
CO2: 17 mmol/L — ABNORMAL LOW (ref 22–32)
Calcium: 8.9 mg/dL (ref 8.9–10.3)
Chloride: 104 mmol/L (ref 98–111)
Creatinine, Ser: 4.35 mg/dL — ABNORMAL HIGH (ref 0.61–1.24)
GFR, Estimated: 14 mL/min — ABNORMAL LOW (ref >60)
Glucose, Bld: 195 mg/dL — ABNORMAL HIGH (ref 70–99)
Potassium: 4 mmol/L (ref 3.5–5.1)
Sodium: 135 mmol/L (ref 135–145)

## 2023-10-10 LAB — RESP PANEL BY RT-PCR (RSV, FLU A&B, COVID)  RVPGX2
Influenza A by PCR: NEGATIVE
Influenza B by PCR: NEGATIVE
Resp Syncytial Virus by PCR: NEGATIVE
SARS Coronavirus 2 by RT PCR: NEGATIVE

## 2023-10-10 LAB — D-DIMER, QUANTITATIVE: D-Dimer, Quant: 1.52 ug{FEU}/mL — ABNORMAL HIGH (ref 0.00–0.50)

## 2023-10-10 LAB — GLUCOSE, CAPILLARY: Glucose-Capillary: 288 mg/dL — ABNORMAL HIGH (ref 70–99)

## 2023-10-10 LAB — BRAIN NATRIURETIC PEPTIDE: B Natriuretic Peptide: 104 pg/mL — ABNORMAL HIGH (ref 0.0–100.0)

## 2023-10-10 LAB — TROPONIN I (HIGH SENSITIVITY)
Troponin I (High Sensitivity): 19 ng/L — ABNORMAL HIGH (ref <18)
Troponin I (High Sensitivity): 18 ng/L — ABNORMAL HIGH (ref <18)

## 2023-10-10 MED ORDER — IPRATROPIUM-ALBUTEROL 0.5-2.5 (3) MG/3ML IN SOLN
3.0000 mL | Freq: Once | RESPIRATORY_TRACT | Status: AC
  Administered 2023-10-10: 3 mL via RESPIRATORY_TRACT
  Filled 2023-10-10: qty 3

## 2023-10-10 MED ORDER — ONDANSETRON HCL 4 MG PO TABS: 4.0000 mg | ORAL_TABLET | Freq: Four times a day (QID) | ORAL | Status: DC | PRN

## 2023-10-10 MED ORDER — ACETAMINOPHEN 325 MG PO TABS
650.0000 mg | ORAL_TABLET | Freq: Four times a day (QID) | ORAL | Status: DC | PRN
  Administered 2023-10-11: 650 mg via ORAL
  Filled 2023-10-10: qty 2

## 2023-10-10 MED ORDER — SODIUM CHLORIDE 0.9 % IV SOLN
1.0000 g | Freq: Once | INTRAVENOUS | Status: AC
  Administered 2023-10-10: 1 g via INTRAVENOUS
  Filled 2023-10-10: qty 10

## 2023-10-10 MED ORDER — GUAIFENESIN ER 600 MG PO TB12
600.0000 mg | ORAL_TABLET | Freq: Two times a day (BID) | ORAL | Status: DC
  Administered 2023-10-10 – 2023-10-12 (×4): 600 mg via ORAL
  Filled 2023-10-10 (×4): qty 1

## 2023-10-10 MED ORDER — PREDNISONE 20 MG PO TABS
40.0000 mg | ORAL_TABLET | Freq: Every day | ORAL | Status: DC
  Administered 2023-10-12: 40 mg via ORAL
  Filled 2023-10-10: qty 2

## 2023-10-10 MED ORDER — ALBUTEROL SULFATE (2.5 MG/3ML) 0.083% IN NEBU: 2.5000 mg | INHALATION_SOLUTION | RESPIRATORY_TRACT | Status: DC | PRN

## 2023-10-10 MED ORDER — INSULIN ASPART 100 UNIT/ML IJ SOLN
0.0000 [IU] | Freq: Every day | INTRAMUSCULAR | Status: DC
  Administered 2023-10-10: 3 [IU] via SUBCUTANEOUS
  Administered 2023-10-11: 2 [IU] via SUBCUTANEOUS
  Filled 2023-10-10 (×2): qty 1

## 2023-10-10 MED ORDER — IPRATROPIUM-ALBUTEROL 0.5-2.5 (3) MG/3ML IN SOLN
6.0000 mL | Freq: Once | RESPIRATORY_TRACT | Status: AC
  Administered 2023-10-10: 6 mL via RESPIRATORY_TRACT
  Filled 2023-10-10: qty 6

## 2023-10-10 MED ORDER — MORPHINE SULFATE (PF) 2 MG/ML IV SOLN: 2.0000 mg | INTRAVENOUS | Status: DC | PRN

## 2023-10-10 MED ORDER — APIXABAN 5 MG PO TABS
5.0000 mg | ORAL_TABLET | Freq: Two times a day (BID) | ORAL | Status: DC
Start: 2023-10-10 — End: 2023-10-12
  Administered 2023-10-10 – 2023-10-12 (×4): 5 mg via ORAL
  Filled 2023-10-10 (×4): qty 1

## 2023-10-10 MED ORDER — HYDROCODONE-ACETAMINOPHEN 5-325 MG PO TABS
1.0000 | ORAL_TABLET | ORAL | Status: DC | PRN
  Administered 2023-10-10: 1 via ORAL
  Filled 2023-10-10: qty 1

## 2023-10-10 MED ORDER — INSULIN ASPART 100 UNIT/ML IJ SOLN
0.0000 [IU] | Freq: Three times a day (TID) | INTRAMUSCULAR | Status: DC
  Administered 2023-10-11 (×2): 8 [IU] via SUBCUTANEOUS
  Administered 2023-10-11: 5 [IU] via SUBCUTANEOUS
  Administered 2023-10-12: 8 [IU] via SUBCUTANEOUS
  Administered 2023-10-12: 5 [IU] via SUBCUTANEOUS
  Filled 2023-10-10 (×5): qty 1

## 2023-10-10 MED ORDER — ONDANSETRON HCL 4 MG/2ML IJ SOLN: 4.0000 mg | Freq: Four times a day (QID) | INTRAMUSCULAR | Status: DC | PRN

## 2023-10-10 MED ORDER — ACETAMINOPHEN 650 MG RE SUPP: 650.0000 mg | Freq: Four times a day (QID) | RECTAL | Status: DC | PRN

## 2023-10-10 MED ORDER — IPRATROPIUM-ALBUTEROL 0.5-2.5 (3) MG/3ML IN SOLN
3.0000 mL | Freq: Four times a day (QID) | RESPIRATORY_TRACT | Status: DC
  Administered 2023-10-11 (×2): 3 mL via RESPIRATORY_TRACT
  Filled 2023-10-10 (×2): qty 3

## 2023-10-10 MED ORDER — LACTATED RINGERS IV BOLUS: 500.0000 mL | Freq: Once | INTRAVENOUS | Status: DC

## 2023-10-10 MED ORDER — METHYLPREDNISOLONE SODIUM SUCC 125 MG IJ SOLR
125.0000 mg | Freq: Once | INTRAMUSCULAR | Status: AC
  Administered 2023-10-10: 125 mg via INTRAVENOUS
  Filled 2023-10-10: qty 2

## 2023-10-10 MED ORDER — SODIUM CHLORIDE 0.9 % IV SOLN
100.0000 mg | Freq: Once | INTRAVENOUS | Status: AC
  Administered 2023-10-10: 100 mg via INTRAVENOUS
  Filled 2023-10-10: qty 100

## 2023-10-10 MED ORDER — LACTATED RINGERS IV BOLUS: 1000.0000 mL | Freq: Once | INTRAVENOUS | Status: DC

## 2023-10-10 MED ORDER — METHYLPREDNISOLONE SODIUM SUCC 40 MG IJ SOLR
40.0000 mg | Freq: Two times a day (BID) | INTRAMUSCULAR | Status: AC
  Administered 2023-10-11 (×2): 40 mg via INTRAVENOUS
  Filled 2023-10-10 (×2): qty 1

## 2023-10-10 NOTE — Assessment & Plan Note (Signed)
 DuoNebs Q6M albuterol  as needed Solu-Medrol  Will hold off on antibiotics Flutter valve, antitussives Supplemental oxygen  if needed

## 2023-10-10 NOTE — Telephone Encounter (Signed)
 SS order emailed to [facility] w/  [facility] [name]

## 2023-10-10 NOTE — ED Triage Notes (Signed)
 First Nurse Note: Patient to ED via [facility] from home for CP. States he only has pain when taking a deep breathe. Concerned for pneumonia. VS WNL with EMS

## 2023-10-10 NOTE — ED Triage Notes (Signed)
 Pt to ED from home, [facility] for chest pain since this morning. Pain is sharp and bilateral. States feels pain with inspiration. Respirations unlabored, skin dry.

## 2023-10-10 NOTE — ED Provider Notes (Signed)
 [facility] Provider Note    Event Date/Time   First MD Initiated Contact with Patient 10/10/23 [phone]     (approximate)   History   Chest Pain   HPI  Larry Callahan is a 68 y.o. male with history of COPD, not oxygen , CHF, CKD, CAD, diabetes, GERD, A-flutter, hypertension, hyperlipidemia, here for chest pain and shortness of breath that started today.  Also has a productive cough this been ongoing for last couple days.  No fever.  States that he has been feeling nauseous but no vomiting or diarrhea, no urinary symptoms or fall.  States that he has been using his inhalers, states that he is compliant with medication including his Eliquis .  No recent travel or surgeries, states that chest pain is worse when he takes deep breath.  He denies any history of malignancy, no leg swelling.   Independent history obtained from nephew, who states that patient started complaining about chest pain and shortness of breath today.  Has a history of COVID and influenza in the past.     Physical Exam   Triage Vital Signs: ED Triage Vitals  Encounter Vitals Group     BP 10/10/23 1446 (!) 132/56     Systolic BP Percentile --      Diastolic BP Percentile --      Pulse Rate 10/10/23 1446 82     Resp 10/10/23 1446 20     Temp 10/10/23 1446 97.8 F (36.6 C)     Temp Source 10/10/23 1446 Axillary     SpO2 10/10/23 1446 100 %     Weight 10/10/23 1443 180 lb (81.6 kg)     Height 10/10/23 1443 5\' 11"  (1.803 m)     Head Circumference --      Peak Flow --      Pain Score 10/10/23 1443 5     Pain Loc --      Pain Education --      Exclude from Growth Chart --     Most recent vital signs: Vitals:   10/10/23 1531 10/10/23 1902  BP: 136/78   Pulse: 80   Resp: 17   Temp:  97.8 F (36.6 C)  SpO2: 100%      General: Awake, no distress.  CV:  Good peripheral perfusion.  Resp:  Normal effort.  Crackles at the left base, sparse wheeze on the right,  tachypnea Abd:  No distention.  Soft nontender Other:  No lower extremity edema, no unilateral calf swelling or tenderness   ED Results / Procedures / Treatments   Labs (all labs ordered are listed, but only abnormal results are displayed) Labs Reviewed  BASIC METABOLIC PANEL WITH GFR - Abnormal; Notable for the following components:      Result Value   CO2 17 (*)    Glucose, Bld 195 (*)    BUN 96 (*)    Creatinine, Ser 4.35 (*)    GFR, Estimated 14 (*)    All other components within normal limits  CBC - Abnormal; Notable for the following components:   RBC 3.35 (*)    Hemoglobin 9.7 (*)    HCT 30.8 (*)    RDW 16.2 (*)    All other components within normal limits  D-DIMER, QUANTITATIVE - Abnormal; Notable for the following components:   D-Dimer, Quant 1.52 (*)    All other components within normal limits  BRAIN NATRIURETIC PEPTIDE - Abnormal; Notable for the following components:  B Natriuretic Peptide 104.0 (*)    All other components within normal limits  TROPONIN I (HIGH SENSITIVITY) - Abnormal; Notable for the following components:   Troponin I (High Sensitivity) 19 (*)    All other components within normal limits  TROPONIN I (HIGH SENSITIVITY) - Abnormal; Notable for the following components:   Troponin I (High Sensitivity) 18 (*)    All other components within normal limits  RESP PANEL BY RT-PCR (RSV, FLU A&B, COVID)  [id number]     EKG  EKG showed sinus rhythm with premature supraventricular complexes, rate 84, normal QRS, normal QTc, no ischemic ST elevation, T wave flattening in 1, aVL, 2, 3, T wave changes are new compared to prior, prior EKG showed a flutter.   RADIOLOGY Checks x-ray to my independent rotation without obvious consolidation   PROCEDURES:  Critical Care performed: No  Ultrasound ED Echo  Date/Time: 10/10/2023 4:24 PM  Performed by: Shane Darling, MD Authorized by: Shane Darling, MD   Procedure details:    Indications: dyspnea     Views:  subxiphoid and IVC view     Limitations:  Body habitus Findings:    Pericardium: no pericardial effusion     LV Function comment:  Diminished   RV Diameter: normal     IVC comment:  Collapsing Impression:    Impression comment:  Diminished contractility, IVC is collapsing and fluid tolerant Ultrasound ED Thoracic  Date/Time: 10/10/2023 4:24 PM  Performed by: Shane Darling, MD Authorized by: Shane Darling, MD   Procedure details:    Indications: dyspnea     Left lung pleural:  Visualized   Right lung pleural:  Visualized Left Lung Findings:     left lung pleural effusion Comments:     No B-lines or pleural effusion on the right, he does have a small pleural effusion with left lateral lower air bronchograms noted.      MEDICATIONS ORDERED IN ED: Medications  lactated ringers  bolus 500 mL (has no administration in time range)  ipratropium-albuterol  (DUONEB) 0.5-2.5 (3) MG/3ML nebulizer solution 6 mL (6 mLs Nebulization Given 10/10/23 1659)  cefTRIAXone  (ROCEPHIN ) 1 g in sodium chloride  0.9 % 100 mL IVPB (0 g Intravenous Stopped 10/10/23 1855)  doxycycline  (VIBRAMYCIN ) 100 mg in sodium chloride  0.9 % 250 mL IVPB (0 mg Intravenous Stopped 10/10/23 1923)  ipratropium-albuterol  (DUONEB) 0.5-2.5 (3) MG/3ML nebulizer solution 3 mL (3 mLs Nebulization Given 10/10/23 1908)  methylPREDNISolone  sodium succinate (SOLU-MEDROL ) 125 mg/2 mL injection 125 mg (125 mg Intravenous Given 10/10/23 1909)  ipratropium-albuterol  (DUONEB) 0.5-2.5 (3) MG/3ML nebulizer solution 3 mL (3 mLs Nebulization Given 10/10/23 1908)     IMPRESSION / MDM / ASSESSMENT AND PLAN / ED COURSE  I reviewed the triage vital signs and the nursing notes.                              Differential diagnosis includes, but is not limited to, pneumonia, viral illness, consider CHF but patient does not appear volume overloaded at this time, consider ACS, costochondritis, musculoskeletal pain, COPD, consider PE given that patient  describes his chest pain is pleuritic, will get a D-dimer, labs, EKG, troponin, chest x-ray, respiratory viral panel.  Bedside ultrasound.  Patient's presentation is most consistent with acute presentation with potential threat to life or bodily function.  Independent review of labs imaging below.  Bedside ultrasound showed diminished contractility his IVC is collapsing, saw small left pleural effusion  with some associated air bronchograms, given his cough and focal findings, this is more concerning for pneumonia.  Will start him on antibiotics, IV ceftriaxone  and doxycycline .  Given his tachypnea, shortness of breath and findings on ultrasound that suggest pneumonia, will plan to have admitted for further management.  Will give him another DuoNeb for the shortness of breath.  Consult to hospitalist who will evaluate the patient.  He is admitted.  The patient is on the cardiac monitor to evaluate for evidence of arrhythmia and/or significant heart rate changes.   Clinical Course as of 10/10/23 1926  Tue Oct 10, 2023  1609 DG Chest 2 View 1. No acute intrathoracic process.  [TT]  1659 Independent review of labs, he has an AKI, electrolytes not severely deranged, troponins mildly elevated, no leukocytosis, D-dimer is elevated but given that his GFR is 14 will put in a VQ scan. [TT]  1710 Troponin I (High Sensitivity)(!) Troponin x 2 stable. [TT]  1803 Resp panel by RT-PCR (RSV, Flu A&B, Covid) Anterior Nasal Swab Neg [TT]    Clinical Course User Index [TT] Shane Darling, MD     FINAL CLINICAL IMPRESSION(S) / ED DIAGNOSES   Final diagnoses:  Chest pain, unspecified type  Shortness of breath  Acute cough  Pneumonia of left lung due to infectious organism, unspecified part of lung  COPD exacerbation (HCC)     Rx / DC Orders   ED Discharge Orders     None        Note:  This document was prepared using Dragon voice recognition software and may include unintentional dictation  errors.    Shane Darling, MD 10/10/23 [phone]

## 2023-10-10 NOTE — Assessment & Plan Note (Signed)
 NICM, EF 40 to 45% 05/2023 Mild BNP elevation to 104 but clinically euvolemic Continue GDMT with carvedilol , hydralazine  and continue furosemide 

## 2023-10-10 NOTE — Assessment & Plan Note (Signed)
 Chronic metabolic acidosis  Renal function and metabolic acidosis at baseline Not currently on bicarb tabs

## 2023-10-10 NOTE — Assessment & Plan Note (Signed)
 Elevated D-dimer Chest pain mostly atypical likely related to cough Troponin 19-18 and EKG nonacute D-dimer elevated however this could be related to stage IV CKD Low suspicion for PE as patient is on Eliquis  and compliant Continue to monitor VQ scan was ordered from the ED.  Can continue to pursue if persistent chest pain in the a.m.

## 2023-10-10 NOTE — Assessment & Plan Note (Signed)
 Continue CPAP.

## 2023-10-10 NOTE — Assessment & Plan Note (Signed)
 BP controlled Continue carvedilol  and hydralazine 

## 2023-10-10 NOTE — Assessment & Plan Note (Signed)
Continue basal insulin.  Sliding scale insulin coverage

## 2023-10-10 NOTE — ED Notes (Signed)
 PT reports increasing SOB. Speaking in full sentences without difficulty. VS rechecked at this time.

## 2023-10-10 NOTE — H&P (Signed)
 History and Physical    Patient: Larry Callahan WUJ:[id number] DOB: 10/12/1955 DOA: 10/10/2023 DOS: the patient was seen and examined on 10/10/2023 PCP: Laurence Pons, NP  Patient coming from: Home  Chief Complaint:  Chief Complaint  Patient presents with   Chest Pain    HPI: Larry Callahan is a 68 y.o. male with medical history significant for CAD, CKD 4, HTN, DM, COPD, OSA on CPAP, HFrEF secondary to NICM (EF 40-45%), atrial flutter s/p ablation (05/2023 )on Eliquis , history of upper GI bleed (03/2023 ), who presented with cough, shortness of breath and chest pain that started on the day of arrival.  He has nausea without vomiting and has no abdominal pain, diarrhea or constipation or dysuria.  His chest pain is worse with taking deep breaths.  He denies leg pain or swelling denies fever or chills. ED course and data review: Vitals within normal limits Labs notable for troponin 18, BNP 104, D-dimer 1.52 WBC 5.9 with negative respiratory viral panel Hemoglobin 9.7 Creatinine 4.35, up from baseline 3.95 05/2023, with bicarb of 17 EKG, personally viewed and interpreted showing sinus rhythm with PVCs and T wave inversion in lateral leads. Chest x-ray with no acute intrathoracic process  Patient treated with DuoNebs and Solu-Medrol  and started on IV doxycycline  for possible respiratory tract infection  Hospitalist consulted for admission.     Review of Systems: As mentioned in the history of present illness. All other systems reviewed and are negative.  Past Medical History:  Diagnosis Date   Allergy    Atrial flutter (HCC)    a. Dx 12/2020--CHA2DS2VASc = 5-->eliquis .   Chronic combined systolic (congestive) and diastolic (congestive) heart failure (HCC)    a. 04/2019 Echo: EF 45-50%; b. 02/2019 Echo: EF 55-60%; c. 09/2020 Echo: EF 35-40%, glob HK. Nl RV size/fxn. Mild MR; d. 07/2021 Echo: EF 40-45%, mild LVH, low-nl RV fxn, no MR.   CKD (chronic kidney disease),  stage III (HCC)    COPD (chronic obstructive pulmonary disease) (HCC)    Coronary artery disease    a. 2011 Cath: nonobs dzs; b. 09/2020 MV: EF 38%. No signif ischemia. ? inf MI vs diaph attenuation. GI uptake artifact.   Diabetes mellitus without complication (HCC)    GERD (gastroesophageal reflux disease)    History of CVA (cerebrovascular accident) 09/25/2020   Hyperlipidemia    Hypertension    Morbid (severe) obesity due to excess calories (HCC) 06/09/2017   Morbid obesity (HCC)    NICM (nonischemic cardiomyopathy) (HCC)    a. 04/2019 Echo: EF 45-50%; b. 02/2019 Echo: EF 55-60%; c. 09/2020 Echo: EF 35-40%, glob HK; d. 09/2020 MV: No ischemia. ? inf infarct vs attenuation; d. 07/2021 Echo: EF 40-45%.   OSA (obstructive sleep apnea)    Pneumonia due to COVID-19 virus 01/30/2020   Stage 3b chronic kidney disease (HCC)    Stroke (HCC) 2024   Syncope and collapse 11/16/2020   Past Surgical History:  Procedure Laterality Date   CARDIAC CATHETERIZATION     CHOLECYSTECTOMY     COLONOSCOPY WITH PROPOFOL  N/A 04/12/2019   Procedure: COLONOSCOPY WITH PROPOFOL ;  Surgeon: Selena Daily, MD;  Location: [name] ENDOSCOPY;  Service: Gastroenterology;  Laterality: N/A;   ESOPHAGOGASTRODUODENOSCOPY N/A 04/12/2019   Procedure: ESOPHAGOGASTRODUODENOSCOPY (EGD);  Surgeon: Selena Daily, MD;  Location: [facility] ENDOSCOPY;  Service: Gastroenterology;  Laterality: N/A;   ESOPHAGOGASTRODUODENOSCOPY (EGD) WITH PROPOFOL  N/A 11/21/2019   Procedure: ESOPHAGOGASTRODUODENOSCOPY (EGD) WITH PROPOFOL ;  Surgeon: Selena Daily, MD;  Location: [name] ENDOSCOPY;  Service: Gastroenterology;  Laterality: N/A;   ESOPHAGOGASTRODUODENOSCOPY (EGD) WITH PROPOFOL  N/A 01/11/2022   Procedure: ESOPHAGOGASTRODUODENOSCOPY (EGD) WITH PROPOFOL ;  Surgeon: Marnee Sink, MD;  Location: [name] ENDOSCOPY;  Service: Endoscopy;  Laterality: N/A;   ESOPHAGOGASTRODUODENOSCOPY (EGD) WITH PROPOFOL  N/A 08/02/2022   Procedure:  ESOPHAGOGASTRODUODENOSCOPY (EGD) WITH PROPOFOL ;  Surgeon: Marnee Sink, MD;  Location: [name] ENDOSCOPY;  Service: Endoscopy;  Laterality: N/A;   FLEXIBLE BRONCHOSCOPY Bilateral 05/17/2019   Procedure: FLEXIBLE BRONCHOSCOPY;  Surgeon: Cordie Deters, MD;  Location: [name] ORS;  Service: Pulmonary;  Laterality: Bilateral;   left arm surgery     nephrectomy Left    PARATHYROIDECTOMY     RIGHT HEART CATH N/A 03/29/2019   Procedure: RIGHT HEART CATH;  Surgeon: Devorah Fonder, MD;  Location: [name] INVASIVE CV LAB;  Service: Cardiovascular;  Laterality: N/A;   Social History:  reports that he quit smoking about 3 years ago. His smoking use included cigarettes. He has quit using smokeless tobacco.  His smokeless tobacco use included chew. He reports that he does not drink alcohol and does not use drugs.  Allergies  Allergen Reactions   Penicillins Rash   Codeine  Nausea And Vomiting    Family History  Problem Relation Age of Onset   Diabetes Mother        2003   Lung cancer Father    Diabetes Sister    Hypertension Sister    Heart disease Sister    Cancer Sister    Bone cancer Brother    Prostate cancer Neg Hx    Bladder Cancer Neg Hx    Kidney cancer Neg Hx     Prior to Admission medications   Medication Sig Start Date End Date Taking? Authorizing Provider  ACCU-CHEK GUIDE test strip Use to check blood sugars 4 times a day E11.65 03/08/23   Laurence Pons, NP  Accu-Chek Softclix Lancets lancets Use to check blood sugars 4 times a day   E11.65 08/20/21   Laurence Pons, NP  acetaminophen  (TYLENOL ) 325 MG tablet Take 2 tablets (650 mg total) by mouth every 6 (six) hours as needed for mild pain or moderate pain (or Fever >/= 101). 08/03/22   Althia Atlas, MD  apixaban  (ELIQUIS ) 5 MG TABS tablet Take 1 tablet (5 mg total) by mouth 2 (two) times daily. 10/05/23   Laurence Pons, NP  Blood Glucose Monitoring Suppl (TRUE METRIX AIR GLUCOSE METER) DEVI 1 Device by Does not apply route 2  (two) times daily. 04/13/18   Boscia, Heather E, NP  budeson-glycopyrrolate-formoterol  (BREZTRI  AEROSPHERE) 160-9-4.8 MCG/ACT AERO inhaler Inhale 2 puffs into the lungs 2 (two) times daily. 10/05/23   Abernathy, Alyssa, NP  carvedilol  (COREG ) 12.5 MG tablet Take 1 tablet (12.5 mg total) by mouth 2 (two) times daily. 10/05/23   Laurence Pons, NP  cyanocobalamin  1000 MCG tablet Take 1 tablet (1,000 mcg total) by mouth daily. 10/05/23   Laurence Pons, NP  furosemide  (LASIX ) 40 MG tablet Take 1 tablet (40 mg total) by mouth daily. 10/05/23   Laurence Pons, NP  hydrALAZINE  (APRESOLINE ) 100 MG tablet Take 1 tablet (100 mg total) by mouth 3 (three) times daily. 10/05/23   Laurence Pons, NP  insulin  glargine, 2 Unit Dial , (TOUJEO  MAX SOLOSTAR) 300 UNIT/ML Solostar Pen Inject 16 Units into the skin at bedtime. Inject 20 units in am 09/18/23   Abernathy, Alyssa, NP  Insulin  Pen Needle (NOVOFINE PEN NEEDLE) 32G X 6 MM MISC Use as directed with toujeo  pen 09/08/23   Laurence Pons, NP  ipratropium-albuterol  (DUONEB) 0.5-2.5 (3) MG/3ML SOLN Take 3 mLs by nebulization every 6 (six) hours as needed. 10/05/23   Laurence Pons, NP  iron  polysaccharides (NIFEREX) 150 MG capsule Take 1 capsule (150 mg total) by mouth daily. 09/24/22 12/23/22  Althia Atlas, MD  methocarbamol  (ROBAXIN ) 750 MG tablet Take 1 tablet (750 mg total) by mouth every 6 (six) hours as needed for muscle spasms. 10/05/23   Laurence Pons, NP  omeprazole  (PRILOSEC) 40 MG capsule Take 1 capsule (40 mg total) by mouth in the morning and at bedtime. 09/18/23   Laurence Pons, NP  potassium chloride  SA (KLOR-CON  M) 20 MEQ tablet Take 1 tablet (20 mEq total) by mouth 2 (two) times daily. 10/05/23   Laurence Pons, NP  rosuvastatin  (CRESTOR ) 10 MG tablet Take 1 tablet (10 mg total) by mouth at bedtime. 09/18/23   Laurence Pons, NP    Physical Exam: Vitals:   10/10/23 1531 10/10/23 1902 10/10/23 2100 10/10/23 2115  BP: 136/78      Pulse: 80  74 91  Resp: 17     Temp:  97.8 F (36.6 C)    TempSrc:  Oral    SpO2: 100%  100% 100%  Weight:      Height:       Physical Exam  Labs on Admission: I have personally reviewed following labs and imaging studies  CBC: Recent Labs  Lab 10/10/23 1446  WBC 5.9  HGB 9.7*  HCT 30.8*  MCV 91.9  PLT 212   Basic Metabolic Panel: Recent Labs  Lab 10/10/23 1446  NA 135  K 4.0  CL 104  CO2 17*  GLUCOSE 195*  BUN 96*  CREATININE 4.35*  CALCIUM  8.9   GFR: Estimated Creatinine Clearance: 16.8 mL/min (A) (by C-G formula based on SCr of 4.35 mg/dL (H)). Liver Function Tests: No results for input(s): "AST", "ALT", "ALKPHOS", "BILITOT", "PROT", "ALBUMIN " in the last 168 hours. No results for input(s): "LIPASE", "AMYLASE" in the last 168 hours. No results for input(s): "AMMONIA" in the last 168 hours. Coagulation Profile: No results for input(s): "INR", "PROTIME" in the last 168 hours. Cardiac Enzymes: No results for input(s): "CKTOTAL", "CKMB", "CKMBINDEX", "TROPONINI" in the last 168 hours. BNP (last 3 results) No results for input(s): "PROBNP" in the last 8760 hours. HbA1C: No results for input(s): "HGBA1C" in the last 72 hours. CBG: No results for input(s): "GLUCAP" in the last 168 hours. Lipid Profile: No results for input(s): "CHOL", "HDL", "LDLCALC", "TRIG", "CHOLHDL", "LDLDIRECT" in the last 72 hours. Thyroid  Function Tests: No results for input(s): "TSH", "T4TOTAL", "FREET4", "T3FREE", "THYROIDAB" in the last 72 hours. Anemia Panel: No results for input(s): "VITAMINB12", "FOLATE", "FERRITIN", "TIBC", "IRON ", "RETICCTPCT" in the last 72 hours. Urine analysis:    Component Value Date/Time   COLORURINE STRAW (A) 09/17/2022 1432   APPEARANCEUR CLEAR (A) 09/17/2022 1432   APPEARANCEUR Clear 03/29/2022 1504   LABSPEC 1.009 09/17/2022 1432   LABSPEC 1.005 03/06/2013 0929   PHURINE 5.0 09/17/2022 1432   GLUCOSEU NEGATIVE 09/17/2022 1432   GLUCOSEU  Negative 03/06/2013 0929   HGBUR NEGATIVE 09/17/2022 1432   BILIRUBINUR NEGATIVE 09/17/2022 1432   BILIRUBINUR Negative 03/29/2022 1504   BILIRUBINUR Negative 03/06/2013 0929   KETONESUR NEGATIVE 09/17/2022 1432   PROTEINUR 100 (A) 09/17/2022 1432   UROBILINOGEN 0.2 02/20/2019 1404   NITRITE NEGATIVE 09/17/2022 1432   LEUKOCYTESUR NEGATIVE 09/17/2022 1432   LEUKOCYTESUR Negative 03/06/2013 0929    Radiological Exams on Admission: DG Chest 2 View Result Date: 10/10/2023 CLINICAL  DATA:  Chest pain with inspiration EXAM: CHEST - 2 VIEW COMPARISON:  01/09/2023 FINDINGS: Frontal and lateral views of the chest demonstrate an unremarkable cardiac silhouette. No acute airspace disease, effusion, or pneumothorax. No acute bony abnormalities. IMPRESSION: 1. No acute intrathoracic process. Electronically Signed   By: Bobbye Burrow M.D.   On: 10/10/2023 15:44   Data Reviewed for HPI: Relevant notes from primary care and specialist visits, past discharge summaries as available in EHR, including Care Everywhere. Prior diagnostic testing as pertinent to current admission diagnoses Updated medications and problem lists for reconciliation ED course, including vitals, labs, imaging, treatment and response to treatment Triage notes, nursing and pharmacy notes and ED provider's notes Notable results as noted above in HPI      Assessment and Plan: * COPD with acute exacerbation (HCC) DuoNebs Q6M albuterol  as needed Solu-Medrol  Will hold off on antibiotics Flutter valve, antitussives Supplemental oxygen  if needed  Chest pain Elevated D-dimer Chest pain mostly atypical likely related to cough Troponin 19-18 and EKG nonacute D-dimer elevated however this could be related to stage IV CKD Low suspicion for PE as patient is on Eliquis  and compliant Continue to monitor VQ scan was ordered from the ED.  Can continue to pursue if persistent chest pain in the a.m.  Chronic systolic CHF (congestive  heart failure) (HCC) NICM, EF 40 to 45% 05/2023 Mild BNP elevation to 104 but clinically euvolemic Continue GDMT with carvedilol , hydralazine  and continue furosemide    Obstructive sleep apnea, adult Continue CPAP  Atrial flutter (HCC) S/p ablation 05/2023 Continue Eliquis  and Coreg   CKD (chronic kidney disease) stage 4, GFR 15-29 ml/min (HCC) Chronic metabolic acidosis  Renal function and metabolic acidosis at baseline Not currently on bicarb tabs  Type 2 diabetes mellitus (HCC) Continue basal insulin  Sliding scale insulin  coverage  Essential hypertension BP controlled Continue carvedilol  and hydralazine         DVT prophylaxis: Eliquis   Consults: none  Advance Care Planning:   Code Status: Full Code   Family Communication: none  Disposition Plan: Back to previous home environment  Severity of Illness: The appropriate patient status for this patient is OBSERVATION. Observation status is judged to be reasonable and necessary in order to provide the required intensity of service to ensure the patient's safety. The patient's presenting symptoms, physical exam findings, and initial radiographic and laboratory data in the context of their medical condition is felt to place them at decreased risk for further clinical deterioration. Furthermore, it is anticipated that the patient will be medically stable for discharge from the hospital within 2 midnights of admission.   Author: Lanetta Pion, MD 10/10/2023 9:38 PM  For on call review [email or web address].

## 2023-10-10 NOTE — Assessment & Plan Note (Signed)
 S/p ablation 05/2023 Continue Eliquis  and Coreg 

## 2023-10-11 ENCOUNTER — Observation Stay

## 2023-10-11 DIAGNOSIS — J441 Chronic obstructive pulmonary disease with (acute) exacerbation: Secondary | ICD-10-CM | POA: Diagnosis not present

## 2023-10-11 LAB — BASIC METABOLIC PANEL WITH GFR
Anion gap: 12 (ref 5–15)
BUN: 105 mg/dL — ABNORMAL HIGH (ref 8–23)
CO2: 17 mmol/L — ABNORMAL LOW (ref 22–32)
Calcium: 8.4 mg/dL — ABNORMAL LOW (ref 8.9–10.3)
Chloride: 106 mmol/L (ref 98–111)
Creatinine, Ser: 4.2 mg/dL — ABNORMAL HIGH (ref 0.61–1.24)
GFR, Estimated: 14 mL/min — ABNORMAL LOW (ref >60)
Glucose, Bld: 287 mg/dL — ABNORMAL HIGH (ref 70–99)
Potassium: 4.3 mmol/L (ref 3.5–5.1)
Sodium: 135 mmol/L (ref 135–145)

## 2023-10-11 LAB — CBC
HCT: 26.3 % — ABNORMAL LOW (ref 39.0–52.0)
Hemoglobin: 8.6 g/dL — ABNORMAL LOW (ref 13.0–17.0)
MCH: 29.1 pg (ref 26.0–34.0)
MCHC: 32.7 g/dL (ref 30.0–36.0)
MCV: 88.9 fL (ref 80.0–100.0)
Platelets: 176 10*3/uL (ref 150–400)
RBC: 2.96 MIL/uL — ABNORMAL LOW (ref 4.22–5.81)
RDW: 15.9 % — ABNORMAL HIGH (ref 11.5–15.5)
WBC: 2.7 10*3/uL — ABNORMAL LOW (ref 4.0–10.5)
nRBC: 0 % (ref 0.0–0.2)

## 2023-10-11 LAB — GLUCOSE, CAPILLARY
Glucose-Capillary: 232 mg/dL — ABNORMAL HIGH (ref 70–99)
Glucose-Capillary: 274 mg/dL — ABNORMAL HIGH (ref 70–99)
Glucose-Capillary: 278 mg/dL — ABNORMAL HIGH (ref 70–99)
Glucose-Capillary: 209 mg/dL — ABNORMAL HIGH (ref 70–99)

## 2023-10-11 LAB — HIV ANTIBODY (ROUTINE TESTING W REFLEX): HIV Screen 4th Generation wRfx: NONREACTIVE

## 2023-10-11 MED ORDER — BUDESON-GLYCOPYRROL-FORMOTEROL 160-9-4.8 MCG/ACT IN AERO
2.0000 | INHALATION_SPRAY | Freq: Two times a day (BID) | RESPIRATORY_TRACT | Status: DC
  Administered 2023-10-11 – 2023-10-12 (×2): 2 via RESPIRATORY_TRACT
  Filled 2023-10-11: qty 5.9

## 2023-10-11 MED ORDER — INSULIN GLARGINE-YFGN 100 UNIT/ML ~~LOC~~ SOPN
15.0000 [IU] | PEN_INJECTOR | SUBCUTANEOUS | Status: DC
  Filled 2023-10-11 (×3): qty 3

## 2023-10-11 MED ORDER — DIPHENHYDRAMINE HCL 25 MG PO CAPS
50.0000 mg | ORAL_CAPSULE | Freq: Three times a day (TID) | ORAL | Status: DC | PRN
  Administered 2023-10-11 – 2023-10-12 (×2): 50 mg via ORAL
  Filled 2023-10-11 (×2): qty 2

## 2023-10-11 MED ORDER — INSULIN GLARGINE-YFGN 100 UNIT/ML ~~LOC~~ SOLN
15.0000 [IU] | Freq: Every day | SUBCUTANEOUS | Status: DC
  Administered 2023-10-11: 15 [IU] via SUBCUTANEOUS
  Filled 2023-10-11 (×2): qty 0.15

## 2023-10-11 MED ORDER — CARVEDILOL 12.5 MG PO TABS
12.5000 mg | ORAL_TABLET | Freq: Two times a day (BID) | ORAL | Status: DC
  Administered 2023-10-11 – 2023-10-12 (×2): 12.5 mg via ORAL
  Filled 2023-10-11 (×2): qty 1

## 2023-10-11 MED ORDER — TECHNETIUM TO 99M ALBUMIN AGGREGATED
4.0000 | Freq: Once | INTRAVENOUS | Status: AC | PRN
  Administered 2023-10-11: 4.28 via INTRAVENOUS

## 2023-10-11 MED ORDER — ROSUVASTATIN CALCIUM 10 MG PO TABS
10.0000 mg | ORAL_TABLET | Freq: Every day | ORAL | Status: DC
  Administered 2023-10-11: 10 mg via ORAL
  Filled 2023-10-11: qty 1

## 2023-10-11 MED ORDER — PANTOPRAZOLE SODIUM 40 MG PO TBEC
40.0000 mg | DELAYED_RELEASE_TABLET | Freq: Every day | ORAL | Status: DC
  Administered 2023-10-11 – 2023-10-12 (×2): 40 mg via ORAL
  Filled 2023-10-11 (×2): qty 1

## 2023-10-11 NOTE — Plan of Care (Signed)

## 2023-10-11 NOTE — Progress Notes (Signed)
 PROGRESS NOTE    Larry Callahan   ZOX:[id number] DOB: Jan 28, 1956  DOA: 10/10/2023 Date of Service: 10/11/23 which is hospital day 0  PCP: Laurence Pons, NP    Hospital course / significant events:   HPI: Larry Callahan is a 68 y.o. male with medical history significant for CAD, CKD 4, HTN, DM, COPD, OSA on CPAP, HFrEF secondary to NICM (EF 40-45%), atrial flutter s/p ablation (05/2023 )on Eliquis , history of upper GI bleed (03/2023 ), who presented with 2-day history of cough productive of yellow phlegm, shortness of breath and on the day of arrival developed chest pain with deep inspiration.  Chest pain is felt in his lower ribs across his chest when he takes a deep breath and when he coughs  He denies leg pain or swelling denies fever or chills.  He is compliant with his Eliquis    04/29: admitted to hospitalist service for COPD exacerbation, AKI 04/30: VQ negative for PE. Cr/GFR remain baseline. Remains on room air. Pt trying to get nebulizer from his previous landlord, we attempted to get him another one but it is not covered, pt's nephew will be able to get him tomorrow. Renal function very slightly better today      Consultants:  none  Procedures/Surgeries: none      ASSESSMENT & PLAN:   COPD with acute exacerbation - improved Acute bronchitis Continue home Breztri  Albuterol  as needed Solu-Medrol  --> prednisone   Will hold off on antibiotics Flutter valve, antitussives Supplemental oxygen  if needed but he has been on room air   AKI on CKD4 encourage hydration Monitor BMP Will need outpatient follow up   Chest pain - pleuritic  Elevated D-dimer Chest pain mostly atypical likely related to cough Troponin 19-18 and EKG nonacute VQ scan --> negative    Chronic systolic CHF (congestive heart failure)  NICM, EF 40 to 45% 05/2023 Mild BNP elevation to 104 but clinically euvolemic Continue carvedilol , hydralazine  hold furosemide  w/ renal  function    Obstructive sleep apnea, adult Continue CPAP   Atrial flutter  S/p ablation 05/2023 Continue Eliquis  and Coreg    CKD (chronic kidney disease) stage 4, GFR 15-29 ml/min  Chronic metabolic acidosis  Renal function and metabolic acidosis at baseline Not currently on bicarb tabs Monitor BMP   Type 2 diabetes mellitus  Continue basal insulin  Sliding scale insulin  coverage   Essential hypertension BP controlled Continue carvedilol  and hydralazine     Overweight based on BMI: Body mass index is 25.1 kg/m.Aaron Aas Significantly low or high BMI is associated with higher medical risk.  Underweight - under 18  overweight - 25 to 29 obese - 30 or more Class 1 obesity: BMI of 30.0 to 34 Class 2 obesity: BMI of 35.0 to 39 Class 3 obesity: BMI of 40.0 to 49 Super Morbid Obesity: BMI 50-59 Super-super Morbid Obesity: BMI 60+ Healthy nutrition and physical activity advised as adjunct to other disease management and risk reduction treatments    DVT prophylaxis: eliquis  IV fluids: no continuous IV fluids  Nutrition: carb modified diet  Central lines / other devices: none  Code Status: FULL CODE ACP documentation reviewed: none on file in [facility]  TOC needs: possible med assistance Medical barriers to dispo: AKI. Expected medical readiness for discharge possibly tomorrow.              Subjective / Brief ROS:  Patient reports no concerns other than still some doscomfort in upper chest w/ deep breaths in  Denies CP/SOB.  Pain controlled.  Denies new weakness.  Tolerating diet.  Reports no concerns w/ urination/defecation.   Family Communication: none at this time     Objective Findings:  Vitals:   10/11/23 0737 10/11/23 0758 10/11/23 1121 10/11/23 1123  BP:  (!) 159/79  (!) 148/73  Pulse:  79 73 76  Resp:  19  20  Temp:  98.2 F (36.8 C)  98.3 F (36.8 C)  TempSrc:      SpO2: 99% 99% 98% 98%  Weight:      Height:        Intake/Output Summary (Last  24 hours) at 10/11/2023 1518 Last data filed at 10/11/2023 1416 Gross per 24 hour  Intake 590 ml  Output 1200 ml  Net -610 ml   Filed Weights   10/10/23 1443  Weight: 81.6 kg    Examination:  Physical Exam Constitutional:      General: He is not in acute distress. Cardiovascular:     Rate and Rhythm: Normal rate and regular rhythm.  Pulmonary:     Effort: Pulmonary effort is normal. No tachypnea or accessory muscle usage.     Breath sounds: Decreased breath sounds and wheezing (scattered bilateral) present.  Musculoskeletal:     Right lower leg: No edema.     Left lower leg: No edema.  Skin:    General: Skin is warm and dry.  Neurological:     Mental Status: He is alert.  Psychiatric:        Mood and Affect: Mood normal.        Behavior: Behavior normal.          Scheduled Medications:   apixaban   5 mg Oral BID   budeson-glycopyrrolate-formoterol   2 puff Inhalation BID   carvedilol   12.5 mg Oral BID   guaiFENesin   600 mg Oral BID   insulin  aspart  0-15 Units Subcutaneous TID WC   insulin  aspart  0-5 Units Subcutaneous QHS   methylPREDNISolone  (SOLU-MEDROL ) injection  40 mg Intravenous Q12H   Followed by   Cecily Cohen ON 10/12/2023] predniSONE   40 mg Oral Q breakfast   pantoprazole   40 mg Oral Daily   rosuvastatin   10 mg Oral QHS    Continuous Infusions:   PRN Medications:  acetaminophen  **OR** acetaminophen , albuterol , diphenhydrAMINE , HYDROcodone -acetaminophen , morphine  injection, ondansetron  **OR** ondansetron  (ZOFRAN ) IV  Antimicrobials from admission:  Anti-infectives (From admission, onward)    Start     Dose/Rate Route Frequency Ordered Stop   10/10/23 1630  cefTRIAXone  (ROCEPHIN ) 1 g in sodium chloride  0.9 % 100 mL IVPB        1 g 200 mL/hr over 30 Minutes Intravenous  Once 10/10/23 1627 10/10/23 1855   10/10/23 1630  doxycycline  (VIBRAMYCIN ) 100 mg in sodium chloride  0.9 % 250 mL IVPB        100 mg 125 mL/hr over 120 Minutes Intravenous  Once  10/10/23 1627 10/10/23 1923           Data Reviewed:  I have personally reviewed the following...  CBC: Recent Labs  Lab 10/10/23 1446 10/11/23 0627  WBC 5.9 2.7*  HGB 9.7* 8.6*  HCT 30.8* 26.3*  MCV 91.9 88.9  PLT 212 176   Basic Metabolic Panel: Recent Labs  Lab 10/10/23 1446 10/11/23 0627  NA 135 135  K 4.0 4.3  CL 104 106  CO2 17* 17*  GLUCOSE 195* 287*  BUN 96* 105*  CREATININE 4.35* 4.20*  CALCIUM  8.9 8.4*   GFR: Estimated Creatinine Clearance: 17.4 mL/min (A) (by  C-G formula based on SCr of 4.2 mg/dL (H)). Liver Function Tests: No results for input(s): "AST", "ALT", "ALKPHOS", "BILITOT", "PROT", "ALBUMIN " in the last 168 hours. No results for input(s): "LIPASE", "AMYLASE" in the last 168 hours. No results for input(s): "AMMONIA" in the last 168 hours. Coagulation Profile: No results for input(s): "INR", "PROTIME" in the last 168 hours. Cardiac Enzymes: No results for input(s): "CKTOTAL", "CKMB", "CKMBINDEX", "TROPONINI" in the last 168 hours. BNP (last 3 results) No results for input(s): "PROBNP" in the last 8760 hours. HbA1C: No results for input(s): "HGBA1C" in the last 72 hours. CBG: Recent Labs  Lab 10/10/23 2249 10/11/23 0813 10/11/23 1150  GLUCAP 288* 232* 274*   Lipid Profile: No results for input(s): "CHOL", "HDL", "LDLCALC", "TRIG", "CHOLHDL", "LDLDIRECT" in the last 72 hours. Thyroid  Function Tests: No results for input(s): "TSH", "T4TOTAL", "FREET4", "T3FREE", "THYROIDAB" in the last 72 hours. Anemia Panel: No results for input(s): "VITAMINB12", "FOLATE", "FERRITIN", "TIBC", "IRON ", "RETICCTPCT" in the last 72 hours. Most Recent Urinalysis On File:     Component Value Date/Time   COLORURINE STRAW (A) 09/17/2022 1432   APPEARANCEUR CLEAR (A) 09/17/2022 1432   APPEARANCEUR Clear 03/29/2022 1504   LABSPEC 1.009 09/17/2022 1432   LABSPEC 1.005 03/06/2013 0929   PHURINE 5.0 09/17/2022 1432   GLUCOSEU NEGATIVE 09/17/2022 1432    GLUCOSEU Negative 03/06/2013 0929   HGBUR NEGATIVE 09/17/2022 1432   BILIRUBINUR NEGATIVE 09/17/2022 1432   BILIRUBINUR Negative 03/29/2022 1504   BILIRUBINUR Negative 03/06/2013 0929   KETONESUR NEGATIVE 09/17/2022 1432   PROTEINUR 100 (A) 09/17/2022 1432   UROBILINOGEN 0.2 02/20/2019 1404   NITRITE NEGATIVE 09/17/2022 1432   LEUKOCYTESUR NEGATIVE 09/17/2022 1432   LEUKOCYTESUR Negative 03/06/2013 0929   Sepsis Labs: @LABRCNTIP (procalcitonin:4,lacticidven:4) Microbiology: Recent Results (from the past 240 hours)  Resp panel by RT-PCR (RSV, Flu A&B, Covid) Anterior Nasal Swab     Status: None   Collection Time: 10/10/23  4:30 PM   Specimen: Anterior Nasal Swab  Result Value Ref Range Status   SARS Coronavirus 2 by RT PCR NEGATIVE NEGATIVE Final    Comment: (NOTE) SARS-CoV-2 target nucleic acids are NOT DETECTED.  The SARS-CoV-2 RNA is generally detectable in upper respiratory specimens during the acute phase of infection. The lowest concentration of SARS-CoV-2 viral copies this assay can detect is 138 copies/mL. A negative result does not preclude SARS-Cov-2 infection and should not be used as the sole basis for treatment or other patient management decisions. A negative result may occur with  improper specimen collection/handling, submission of specimen other than nasopharyngeal swab, presence of viral mutation(s) within the areas targeted by this assay, and inadequate number of viral copies(<138 copies/mL). A negative result must be combined with clinical observations, patient history, and epidemiological information. The expected result is Negative.  Fact Sheet for Patients:  BloggerCourse.com  Fact Sheet for Healthcare Providers:  SeriousBroker.it  This test is no t yet approved or cleared by the [place]  FDA and  has been authorized for detection and/or diagnosis of SARS-CoV-2 by FDA under an Emergency Use  Authorization (EUA). This EUA will remain  in effect (meaning this test can be used) for the duration of the COVID-19 declaration under Section 564(b)(1) of the Act, 21 U.S.C.section 360bbb-3(b)(1), unless the authorization is terminated  or revoked sooner.       Influenza A by PCR NEGATIVE NEGATIVE Final   Influenza B by PCR NEGATIVE NEGATIVE Final    Comment: (NOTE) The Xpert Xpress SARS-CoV-2/FLU/RSV plus assay is intended  as an aid in the diagnosis of influenza from Nasopharyngeal swab specimens and should not be used as a sole basis for treatment. Nasal washings and aspirates are unacceptable for Xpert Xpress SARS-CoV-2/FLU/RSV testing.  Fact Sheet for Patients: BloggerCourse.com  Fact Sheet for Healthcare Providers: SeriousBroker.it  This test is not yet approved or cleared by the [place]  FDA and has been authorized for detection and/or diagnosis of SARS-CoV-2 by FDA under an Emergency Use Authorization (EUA). This EUA will remain in effect (meaning this test can be used) for the duration of the COVID-19 declaration under Section 564(b)(1) of the Act, 21 U.S.C. section 360bbb-3(b)(1), unless the authorization is terminated or revoked.     Resp Syncytial Virus by PCR NEGATIVE NEGATIVE Final    Comment: (NOTE) Fact Sheet for Patients: BloggerCourse.com  Fact Sheet for Healthcare Providers: SeriousBroker.it  This test is not yet approved or cleared by the [place]  FDA and has been authorized for detection and/or diagnosis of SARS-CoV-2 by FDA under an Emergency Use Authorization (EUA). This EUA will remain in effect (meaning this test can be used) for the duration of the COVID-19 declaration under Section 564(b)(1) of the Act, 21 U.S.C. section 360bbb-3(b)(1), unless the authorization is terminated or revoked.  Performed at [facility], [address], [place], [place] [address]       Radiology Studies last 3 days: NM Pulmonary Perfusion Result Date: 10/11/2023 CLINICAL DATA:  Chest pain with inspiration. EXAM: NUCLEAR MEDICINE PERFUSION LUNG SCAN TECHNIQUE: Perfusion images were obtained in multiple projections after intravenous injection of radiopharmaceutical. Ventilation scans intentionally deferred if perfusion scan and chest x-ray adequate for interpretation during COVID 19 epidemic. RADIOPHARMACEUTICALS:  4.3 mCi Tc-69m MAA IV COMPARISON:  09/23/2022 FINDINGS: No focal segmental or larger peripheral wedge-shaped filling defect in either lung. IMPRESSION: Negative for pulmonary embolism. Electronically Signed   By: Donnal Fusi M.D.   On: 10/11/2023 09:43   DG Chest 2 View Result Date: 10/10/2023 CLINICAL DATA:  Chest pain with inspiration EXAM: CHEST - 2 VIEW COMPARISON:  01/09/2023 FINDINGS: Frontal and lateral views of the chest demonstrate an unremarkable cardiac silhouette. No acute airspace disease, effusion, or pneumothorax. No acute bony abnormalities. IMPRESSION: 1. No acute intrathoracic process. Electronically Signed   By: Bobbye Burrow M.D.   On: 10/10/2023 15:44           Babbie Dondlinger, DO [facility] 10/11/2023, 3:18 PM    Dictation software may have been used to generate the above note. Typos may occur and escape review in typed/dictated notes. Please contact Dr Authur Leghorn directly for clarity if needed.  Staff may message me via secure chat in Epic  but this may not receive an immediate response,  please page me for urgent matters!  If 7PM-7AM, please contact night coverage [email or web address]

## 2023-10-11 NOTE — Hospital Course (Addendum)
 Hospital course / significant events:   HPI: Larry Callahan is a 68 y.o. male with medical history significant for CAD, CKD 4, HTN, DM, COPD, OSA on CPAP, HFrEF secondary to NICM (EF 40-45%), atrial flutter s/p ablation (05/2023 )on Eliquis , history of upper GI bleed (03/2023 ), who presented with 2-day history of cough productive of yellow phlegm, shortness of breath and on the day of arrival developed chest pain with deep inspiration.  Chest pain is felt in his lower ribs across his chest when he takes a deep breath and when he coughs  He denies leg pain or swelling denies fever or chills.  He is compliant with his Eliquis    04/29: admitted to hospitalist service for COPD exacerbation, AKI 04/30: VQ negative for PE. Cr/GFR remain baseline. Remains on room air. Pt trying to get nebulizer from his previous landlord, we attempted to get him another one but it is not covered, pt's nephew will be able to get him tomorrow. Renal function very slightly better today      Consultants:  none  Procedures/Surgeries: none      ASSESSMENT & PLAN:   COPD with acute exacerbation - improved Acute bronchitis Continue home Breztri  Albuterol  as needed Solu-Medrol  --> prednisone   Will hold off on antibiotics Flutter valve, antitussives Supplemental oxygen  if needed but he has been on room air   AKI on CKD4 encourage hydration Monitor BMP Will need outpatient follow up   Chest pain - pleuritic  Elevated D-dimer Chest pain mostly atypical likely related to cough Troponin 19-18 and EKG nonacute VQ scan --> negative    Chronic systolic CHF (congestive heart failure)  NICM, EF 40 to 45% 05/2023 Mild BNP elevation to 104 but clinically euvolemic Continue carvedilol , hydralazine  hold furosemide  w/ renal function    Obstructive sleep apnea, adult Continue CPAP   Atrial flutter  S/p ablation 05/2023 Continue Eliquis  and Coreg    CKD (chronic kidney disease) stage 4, GFR 15-29 ml/min   Chronic metabolic acidosis  Renal function and metabolic acidosis at baseline Not currently on bicarb tabs Monitor BMP   Type 2 diabetes mellitus  Continue basal insulin  Sliding scale insulin  coverage   Essential hypertension BP controlled Continue carvedilol  and hydralazine     Overweight based on BMI: Body mass index is 25.1 kg/m.Aaron Aas Significantly low or high BMI is associated with higher medical risk.  Underweight - under 18  overweight - 25 to 29 obese - 30 or more Class 1 obesity: BMI of 30.0 to 34 Class 2 obesity: BMI of 35.0 to 39 Class 3 obesity: BMI of 40.0 to 49 Super Morbid Obesity: BMI 50-59 Super-super Morbid Obesity: BMI 60+ Healthy nutrition and physical activity advised as adjunct to other disease management and risk reduction treatments    DVT prophylaxis: eliquis  IV fluids: no continuous IV fluids  Nutrition: carb modified diet  Central lines / other devices: none  Code Status: FULL CODE ACP documentation reviewed: none on file in [facility]  TOC needs: possible med assistance Medical barriers to dispo: AKI. Expected medical readiness for discharge possibly tomorrow.

## 2023-10-11 NOTE — Inpatient Diabetes Management (Signed)
 Inpatient Diabetes Program Recommendations  AACE/ADA: New Consensus Statement on Inpatient Glycemic Control (2015)  Target Ranges:  Prepandial:   less than 140 mg/dL      Peak postprandial:   less than 180 mg/dL (1-2 hours)      Critically ill patients:  140 - 180 mg/dL   Lab Results  Component Value Date   GLUCAP 232 (H) 10/11/2023   HGBA1C 6.7 (A) 09/18/2023    Review of Glycemic Control  Latest Reference Range & Units 10/10/23 22:49 10/11/23 08:13  Glucose-Capillary 70 - 99 mg/dL 782 (H) 956 (H)  (H): Data is abnormally high Diabetes history: Type 2 Dm Outpatient Diabetes medications: Toujeo  20 units QA/16 units QP Current orders for Inpatient glycemic control: Novolog  0-15 units TID & HS Solumedrol 125 mg x 1, then taper to 40 mg BID  Inpatient Diabetes Program Recommendations:    Consider adding Semglee  15 units every day.  Thanks, Marjo Sievert, MSN, RNC-OB Diabetes Coordinator [phone] (8a-5p)

## 2023-10-11 NOTE — TOC Initial Note (Signed)
 Transition of Care [facility]) - Initial/Assessment Note    Patient Details  Name: Larry Callahan MRN: [id number] Date of Birth: [birth year]  [facility]) CM/SW Contact:    Odilia Bennett, LCSW Phone Number: 10/11/2023, 4:14 PM  Clinical Narrative:  CSW met with patient. No supports at bedside. Patient stated he recently moved and lost his nebulizer, oxygen , and bipap machine. His nebulizer is through [employer] and oxygen  and bipap are through [employer]. Both companies confirmed they cannot replace equipment without a police report stating it was stolen. Patient received the nebulizer and bipap in 2023 so insurance will not pay for a new one at this time. CSW encouraged patient to speak with his landlord that cleaned out his previous home. Patient is currently on room air. No further concerns. CSW will continue to follow patient for support and facilitate return home once stable.              Expected Discharge Plan: Home/Self Care Barriers to Discharge: Continued Medical Work up   Patient Goals and CMS Choice            Expected Discharge Plan and Services     Post Acute Care Choice: NA Living arrangements for the past 2 months: Single Family Home                                      Prior Living Arrangements/Services Living arrangements for the past 2 months: Single Family Home Lives with:: Relatives Patient language and need for interpreter reviewed:: Yes Do you feel safe going back to the place where you live?: Yes      Need for Family Participation in Patient Care: Yes (Comment) Care giver support system in place?: Yes (comment) Current home services: DME Criminal Activity/Legal Involvement Pertinent to Current Situation/Hospitalization: No - Comment as needed  Activities of Daily Living   ADL Screening (condition at time of admission) Independently performs ADLs?: Yes (appropriate for developmental age) Is the patient deaf or have difficulty hearing?:  No Does the patient have difficulty seeing, even when wearing glasses/contacts?: No Does the patient have difficulty concentrating, remembering, or making decisions?: No  Permission Sought/Granted                  Emotional Assessment Appearance:: Appears stated age Attitude/Demeanor/Rapport: Engaged, Gracious Affect (typically observed): Accepting, Appropriate, Calm, Pleasant Orientation: : Oriented to Self, Oriented to Place, Oriented to Situation, Oriented to  Time Alcohol / Substance Use: Not Applicable Psych Involvement: No (comment)  Admission diagnosis:  Shortness of breath [R06.02] COPD exacerbation (HCC) [J44.1] COPD with acute exacerbation (HCC) [J44.1] Chest pain, unspecified type [R07.9] Pneumonia of left lung due to infectious organism, unspecified part of lung [J18.9] Acute cough [R05.1] Patient Active Problem List   Diagnosis Date Noted   COPD with acute exacerbation (HCC) 10/10/2023   Metabolic acidosis, normal anion gap (NAG) 10/10/2023   Positive D dimer 09/23/2022   Hemoptysis 09/23/2022   Left arm numbness 09/13/2022   Acute upper GI bleed 07/31/2022   Chest pain 07/31/2022   Hematoma of left flank 07/31/2022   Acute on chronic anemia 07/31/2022   Renal mass 07/31/2022   Upper GI bleed    Dizziness and giddiness 01/04/2022   Umbilical hernia 01/04/2022   Acute on chronic systolic CHF (congestive heart failure) (HCC) 08/05/2021   Acquired thrombophilia (HCC) 06/28/2021   Anemia of chronic kidney failure 06/28/2021  PAD (peripheral artery disease) (HCC)    Cellulitis of right arm 03/02/2021   Atrial flutter (HCC) 01/07/2021   Demand ischemia (HCC)    Influenza A 10/30/2020   HLD (hyperlipidemia) 10/06/2020   Gastroesophageal reflux disease without esophagitis 04/19/2020   Elevated troponin I level 01/29/2020   Hematemesis 01/09/2020   Hospital discharge follow-up 11/26/2019   Obesity (BMI 30-39.9) 08/08/2019   Acute right hip pain 06/17/2019    Inability to ambulate due to hip 06/17/2019   CKD (chronic kidney disease) stage 4, GFR 15-29 ml/min (HCC)    Hypervolemia    Full code status 05/20/2019   Pleuritic chest pain    Acute on chronic heart failure with preserved ejection fraction (HFpEF) (HCC)    Chronic systolic CHF (congestive heart failure) (HCC) 03/01/2019   Iron  deficiency anemia 12/31/2018   Atopic dermatitis 11/24/2018   Screening for colon cancer 11/06/2017   Type 2 diabetes mellitus (HCC) 11/06/2017   Hidradenitis 06/09/2017   Atherosclerotic heart disease of native coronary artery without angina pectoris 06/09/2017   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   Obstructive sleep apnea, adult 06/09/2017   Cigarette nicotine  dependence with nicotine -induced disorder 06/09/2017   Diabetic polyneuropathy associated with type 2 diabetes mellitus (HCC) 06/09/2017   Allergic rhinitis due to pollen 06/09/2017   Mixed hyperlipidemia 06/09/2017   COPD (chronic obstructive pulmonary disease) (HCC) 06/09/2017   Wheezing 06/09/2017   Dysuria 06/09/2017   Essential hypertension 06/09/2017   Personal history of other malignant neoplasm of kidney 06/09/2017   Pain in right hip 06/09/2017   Secondary hyperparathyroidism, not elsewhere classified (HCC) 06/09/2017   Tinea corporis 06/09/2017   PCP:  Laurence Pons, NP Pharmacy:   [facility] - [place], [place] - [address] [place] [address] Phone: [phone] Fax: [phone]     Social Drivers of Health (SDOH) Social History: SDOH Screenings   Food Insecurity: No Food Insecurity (10/10/2023)  Housing: Low Risk  (10/10/2023)  Transportation Needs: No Transportation Needs (10/10/2023)  Utilities: Not At Risk (10/10/2023)  Alcohol Screen: Low Risk  (03/29/2022)  Depression (PHQ2-9): Low Risk  (09/13/2022)  Financial Resource Strain: Low Risk  (05/12/2021)  Social Connections: Unknown (10/10/2023)  Tobacco Use:  Medium Risk (10/10/2023)   SDOH Interventions:     Readmission Risk Interventions    12/01/2021   12:25 PM 08/07/2021    2:36 PM 06/28/2021    2:26 PM  Readmission Risk Prevention Plan  Transportation Screening Complete Complete Complete  Medication Review Oceanographer) Complete Complete Complete  PCP or Specialist appointment within 3-5 days of discharge Complete Complete Complete  HRI or Home Care Consult Complete Complete Complete  SW Recovery Care/Counseling Consult Complete Complete Complete  Palliative Care Screening Complete Complete Not Applicable  Skilled Nursing Facility Complete Complete Not Applicable

## 2023-10-12 ENCOUNTER — Other Ambulatory Visit: Payer: Self-pay

## 2023-10-12 DIAGNOSIS — J441 Chronic obstructive pulmonary disease with (acute) exacerbation: Secondary | ICD-10-CM | POA: Diagnosis not present

## 2023-10-12 LAB — BASIC METABOLIC PANEL WITH GFR
Anion gap: 12 (ref 5–15)
BUN: 110 mg/dL — ABNORMAL HIGH (ref 8–23)
CO2: 17 mmol/L — ABNORMAL LOW (ref 22–32)
Calcium: 8.2 mg/dL — ABNORMAL LOW (ref 8.9–10.3)
Chloride: 107 mmol/L (ref 98–111)
Creatinine, Ser: 4.03 mg/dL — ABNORMAL HIGH (ref 0.61–1.24)
GFR, Estimated: 15 mL/min — ABNORMAL LOW (ref >60)
Glucose, Bld: 193 mg/dL — ABNORMAL HIGH (ref 70–99)
Potassium: 3.8 mmol/L (ref 3.5–5.1)
Sodium: 136 mmol/L (ref 135–145)

## 2023-10-12 LAB — GLUCOSE, CAPILLARY
Glucose-Capillary: 171 mg/dL — ABNORMAL HIGH (ref 70–99)
Glucose-Capillary: 225 mg/dL — ABNORMAL HIGH (ref 70–99)
Glucose-Capillary: 268 mg/dL — ABNORMAL HIGH (ref 70–99)

## 2023-10-12 MED ORDER — ALBUTEROL SULFATE HFA 108 (90 BASE) MCG/ACT IN AERS
1.0000 | INHALATION_SPRAY | RESPIRATORY_TRACT | 0 refills | Status: DC | PRN
  Filled 2023-10-12: qty 18, 30d supply, fill #0

## 2023-10-12 MED ORDER — PREDNISONE 20 MG PO TABS
40.0000 mg | ORAL_TABLET | Freq: Every day | ORAL | 0 refills | Status: DC
  Filled 2023-10-12: qty 6, 3d supply, fill #0

## 2023-10-12 MED ORDER — LORATADINE 10 MG PO TABS
10.0000 mg | ORAL_TABLET | Freq: Every day | ORAL | 0 refills | Status: DC
  Filled 2023-10-12: qty 30, 30d supply, fill #0

## 2023-10-12 MED ORDER — SENNOSIDES-DOCUSATE SODIUM 8.6-50 MG PO TABS
2.0000 | ORAL_TABLET | Freq: Once | ORAL | Status: AC
  Administered 2023-10-12: 2 via ORAL
  Filled 2023-10-12: qty 2

## 2023-10-12 MED ORDER — GUAIFENESIN ER 600 MG PO TB12
1200.0000 mg | ORAL_TABLET | Freq: Two times a day (BID) | ORAL | 0 refills | Status: AC
  Filled 2023-10-12: qty 28, 7d supply, fill #0

## 2023-10-12 MED ORDER — DIPHENHYDRAMINE HCL 25 MG PO CAPS
25.0000 mg | ORAL_CAPSULE | Freq: Three times a day (TID) | ORAL | 0 refills | Status: DC | PRN
  Filled 2023-10-12: qty 30, 10d supply, fill #0

## 2023-10-12 MED ORDER — POLYETHYLENE GLYCOL 3350 17 G PO PACK
17.0000 g | PACK | Freq: Every day | ORAL | Status: DC
  Administered 2023-10-12: 17 g via ORAL
  Filled 2023-10-12: qty 1

## 2023-10-12 MED ORDER — BISACODYL 10 MG RE SUPP: 10.0000 mg | Freq: Every day | RECTAL | Status: DC | PRN

## 2023-10-12 MED ORDER — POLYETHYLENE GLYCOL 3350 17 GM/SCOOP PO POWD
17.0000 g | Freq: Every day | ORAL | 0 refills | Status: DC
  Filled 2023-10-12: qty 238, 14d supply, fill #0

## 2023-10-12 MED ORDER — LORATADINE 10 MG PO TABS
10.0000 mg | ORAL_TABLET | Freq: Every day | ORAL | Status: DC
  Administered 2023-10-12: 10 mg via ORAL
  Filled 2023-10-12: qty 1

## 2023-10-12 NOTE — Progress Notes (Signed)
 I have reviewed and concur with this student's documentation.   Joeangel Jeanpaul, RN 10/12/2023 12:49 PM

## 2023-10-12 NOTE — TOC Transition Note (Signed)
 Transition of Care [facility]) - Discharge Note   Patient Details  Name: Larry Callahan MRN: [id number] Date of Birth: 04/26/1956  Transition of Care [facility]) CM/SW Contact:  Odilia Bennett, LCSW Phone Number: 10/12/2023, 11:20 AM   Clinical Narrative:   Patient has orders to discharge home today. No further concerns. CSW signing off.  Final next level of care: Home/Self Care Barriers to Discharge: Barriers Resolved   Patient Goals and CMS Choice            Discharge Placement                Patient to be transferred to facility by: Nephew   Patient and family notified of of transfer: 10/12/23  Discharge Plan and Services Additional resources added to the After Visit Summary for       Post Acute Care Choice: NA                               Social Drivers of Health (SDOH) Interventions SDOH Screenings   Food Insecurity: No Food Insecurity (10/10/2023)  Housing: Low Risk  (10/10/2023)  Transportation Needs: No Transportation Needs (10/10/2023)  Utilities: Not At Risk (10/10/2023)  Alcohol Screen: Low Risk  (03/29/2022)  Depression (PHQ2-9): Low Risk  (09/13/2022)  Financial Resource Strain: Low Risk  (05/12/2021)  Social Connections: Unknown (10/10/2023)  Tobacco Use: Medium Risk (10/10/2023)     Readmission Risk Interventions    12/01/2021   12:25 PM 08/07/2021    2:36 PM 06/28/2021    2:26 PM  Readmission Risk Prevention Plan  Transportation Screening Complete Complete Complete  Medication Review Oceanographer) Complete Complete Complete  PCP or Specialist appointment within 3-5 days of discharge Complete Complete Complete  HRI or Home Care Consult Complete Complete Complete  SW Recovery Care/Counseling Consult Complete Complete Complete  Palliative Care Screening Complete Complete Not Applicable  Skilled Nursing Facility Complete Complete Not Applicable

## 2023-10-12 NOTE — Discharge Summary (Signed)
 Physician Discharge Summary   Patient: Larry Callahan MRN: [id number]  DOB: 04-Jul-1955   Admit:     Date of Admission: 10/10/2023 Admitted from: home   Discharge: Date of discharge: 10/12/23 Disposition: Home Condition at discharge: good  CODE STATUS: FULL CODE     Discharge Physician: Melodi Sprung, DO [facility]     PCP: Laurence Pons, NP  Recommendations for Outpatient Follow-up:  Follow up with PCP Laurence Pons, NP in 1-2 weeks Please obtain labs/tests: BMP   Discharge Instructions     Diet Carb Modified   Complete by: As directed    Increase activity slowly   Complete by: As directed          Discharge Diagnoses: Principal Problem:   COPD with acute exacerbation (HCC) Active Problems:   Chest pain   Chronic systolic CHF (congestive heart failure) (HCC)   Obstructive sleep apnea, adult   Essential hypertension   Type 2 diabetes mellitus (HCC)   CKD (chronic kidney disease) stage 4, GFR 15-29 ml/min (HCC)   Atrial flutter (HCC)   Metabolic acidosis, normal anion gap (NAG)    Hospital course / significant events:   HPI: Per Notaro is a 68 y.o. male with medical history significant for CAD, CKD 4, HTN, DM, COPD, OSA on CPAP, HFrEF secondary to NICM (EF 40-45%), atrial flutter s/p ablation (05/2023 )on Eliquis , history of upper GI bleed (03/2023 ), who presented with 2-day history of cough productive of yellow phlegm, shortness of breath and on the day of arrival developed chest pain with deep inspiration.  Chest pain is felt in his lower ribs across his chest when he takes a deep breath and when he coughs  He denies leg pain or swelling denies fever or chills.  He is compliant with his Eliquis    04/29: admitted to hospitalist service for COPD exacerbation, AKI 04/30: VQ negative for PE. Cr/GFR remain baseline. Remains on room air. Pt trying to get nebulizer from his previous landlord, we attempted to get him  another one but it is not covered, pt's nephew will be able to get him tomorrow. Renal function very slightly better today  05/01: renal function trending appropriately, no wheezing on exam, still doing well on room air, stable for discharge      Consultants:  none  Procedures/Surgeries: none      ASSESSMENT & PLAN:   COPD with acute exacerbation - improved Acute bronchitis Continue home Breztri  Albuterol  as needed Solu-Medrol  --> prednisone   Will hold off on antibiotics Flutter valve, antitussives Supplemental oxygen  if needed but he has been on room air   Chest pain - pleuritic  Elevated D-dimer Chest pain mostly atypical likely related to cough Troponin 19-18 and EKG nonacute VQ scan --> negative    Chronic systolic CHF (congestive heart failure)  NICM, EF 40 to 45% 05/2023 Mild BNP elevation to 104 but clinically euvolemic Continue carvedilol , hydralazine  hold furosemide  w/ renal function    Obstructive sleep apnea, adult Continue CPAP   Atrial flutter  S/p ablation 05/2023 Continue Eliquis  and Coreg    AKI on CKD (chronic kidney disease) stage 4, GFR 15-29 ml/min  Chronic metabolic acidosis  Renal function and metabolic acidosis about at baseline Not currently on bicarb tabs Monitor BMP   Type 2 diabetes mellitus  Continue basal insulin  Sliding scale insulin  coverage   Essential hypertension BP controlled Continue carvedilol  and hydralazine   Constipation without obstruction Bowel regimen per med rec   Overweight based on  BMI: Body mass index is 25.1 kg/m.Aaron Aas Significantly low or high BMI is associated with higher medical risk.  Underweight - under 18  overweight - 25 to 29 obese - 30 or more Class 1 obesity: BMI of 30.0 to 34 Class 2 obesity: BMI of 35.0 to 39 Class 3 obesity: BMI of 40.0 to 49 Super Morbid Obesity: BMI 50-59 Super-super Morbid Obesity: BMI 60+ Healthy nutrition and physical activity advised as adjunct to other disease  management and risk reduction treatments           Discharge Instructions  Allergies as of 10/12/2023       Reactions   Penicillins Rash   Codeine  Nausea And Vomiting        Medication List     PAUSE taking these medications    furosemide  40 MG tablet Wait to take this until your doctor or other care provider tells you to start again. Commonly known as: LASIX  Take 1 tablet (40 mg total) by mouth daily.   potassium chloride  SA 20 MEQ tablet Wait to take this until your doctor or other care provider tells you to start again. Commonly known as: KLOR-CON  M Take 1 tablet (20 mEq total) by mouth 2 (two) times daily.       STOP taking these medications    hydrALAZINE  100 MG tablet Commonly known as: APRESOLINE        TAKE these medications    Accu-Chek Guide test strip Generic drug: glucose blood Use to check blood sugars 4 times a day E11.65   Accu-Chek Softclix Lancets lancets Use to check blood sugars 4 times a day   E11.65   acetaminophen  325 MG tablet Commonly known as: TYLENOL  Take 2 tablets (650 mg total) by mouth every 6 (six) hours as needed for mild pain or moderate pain (or Fever >/= 101).   albuterol  108 (90 Base) MCG/ACT inhaler Commonly known as: VENTOLIN  HFA Inhale 1-2 puffs into the lungs every 4 (four) hours as needed for wheezing or shortness of breath.   apixaban  5 MG Tabs tablet Commonly known as: Eliquis  Take 1 tablet (5 mg total) by mouth 2 (two) times daily.   Breztri  Aerosphere 160-9-4.8 MCG/ACT Aero inhaler Generic drug: budeson-glycopyrrolate -formoterol  Inhale 2 puffs into the lungs 2 (two) times daily.   carvedilol  12.5 MG tablet Commonly known as: COREG  Take 1 tablet (12.5 mg total) by mouth 2 (two) times daily.   cyanocobalamin  1000 MCG tablet Take 1 tablet (1,000 mcg total) by mouth daily.   diphenhydrAMINE  25 mg capsule Commonly known as: BENADRYL  Take 1 capsule (25 mg total) by mouth every 8 (eight) hours as  needed for itching.   guaiFENesin  600 MG 12 hr tablet Commonly known as: MUCINEX  Take 2 tablets (1,200 mg total) by mouth 2 (two) times daily for 7 days.   ipratropium-albuterol  0.5-2.5 (3) MG/3ML Soln Commonly known as: DUONEB Take 3 mLs by nebulization every 6 (six) hours as needed.   iron  polysaccharides 150 MG capsule Commonly known as: NIFEREX Take 1 capsule (150 mg total) by mouth daily.   loratadine  10 MG tablet Commonly known as: CLARITIN  Take 1 tablet (10 mg total) by mouth daily.   methocarbamol  750 MG tablet Commonly known as: ROBAXIN  Take 1 tablet (750 mg total) by mouth every 6 (six) hours as needed for muscle spasms.   Novofine Pen Needle 32G X 6 MM Misc Generic drug: Insulin  Pen Needle Use as directed with toujeo  pen   omeprazole  40 MG capsule Commonly known as:  PRILOSEC Take 1 capsule (40 mg total) by mouth in the morning and at bedtime.   polyethylene glycol 17 g packet Commonly known as: MIRALAX  / GLYCOLAX  Take 17 g by mouth daily.   predniSONE  20 MG tablet Commonly known as: DELTASONE  Take 2 tablets (40 mg total) by mouth daily with breakfast for 3 days. Start taking on: Oct 13, 2023   rosuvastatin  10 MG tablet Commonly known as: CRESTOR  Take 1 tablet (10 mg total) by mouth at bedtime.   Toujeo  Max SoloStar 300 UNIT/ML Solostar Pen Generic drug: insulin  glargine (2 Unit Dial ) Inject 16 Units into the skin at bedtime. Inject 20 units in am   True Metrix Air Glucose Meter Devi 1 Device by Does not apply route 2 (two) times daily.         Follow-up Information     Laurence Pons, NP. Schedule an appointment as soon as possible for a visit.   Specialty: Nurse Practitioner Why: asap for hospital follow up COPD and recheck labs Contact information: [address] [place] [address] [phone]                 Allergies  Allergen Reactions   Penicillins Rash   Codeine  Nausea And Vomiting     Subjective: pt reports he  is constipated no BM in 2 days, no abd pain. He is tolerating diet. Walked well today w/ therapy, no SOB or CP though eh reports some coughing    Discharge Exam: BP (!) 151/76 (BP Location: Right Arm)   Pulse 67   Temp 97.6 F (36.4 C)   Resp 18   Ht 5\' 11"  (1.803 m)   Wt 81.6 kg   SpO2 100%   BMI 25.10 kg/m  General: Pt is alert, awake, not in acute distress Cardiovascular: RRR, S1/S2  Respiratory: CTA bilaterally, no wheezing, no rhonchi. Dimished breath sounds  Abdominal: Soft, NT, ND, bowel sounds + Extremities: no edema, no cyanosis     The results of significant diagnostics from this hospitalization (including imaging, microbiology, ancillary and laboratory) are listed below for reference.     Microbiology: Recent Results (from the past 240 hours)  Resp panel by RT-PCR (RSV, Flu A&B, Covid) Anterior Nasal Swab     Status: None   Collection Time: 10/10/23  4:30 PM   Specimen: Anterior Nasal Swab  Result Value Ref Range Status   SARS Coronavirus 2 by RT PCR NEGATIVE NEGATIVE Final    Comment: (NOTE) SARS-CoV-2 target nucleic acids are NOT DETECTED.  The SARS-CoV-2 RNA is generally detectable in upper respiratory specimens during the acute phase of infection. The lowest concentration of SARS-CoV-2 viral copies this assay can detect is 138 copies/mL. A negative result does not preclude SARS-Cov-2 infection and should not be used as the sole basis for treatment or other patient management decisions. A negative result may occur with  improper specimen collection/handling, submission of specimen other than nasopharyngeal swab, presence of viral mutation(s) within the areas targeted by this assay, and inadequate number of viral copies(<138 copies/mL). A negative result must be combined with clinical observations, patient history, and epidemiological information. The expected result is Negative.  Fact Sheet for Patients:   BloggerCourse.com  Fact Sheet for Healthcare Providers:  SeriousBroker.it  This test is no t yet approved or cleared by the [place]  FDA and  has been authorized for detection and/or diagnosis of SARS-CoV-2 by FDA under an Emergency Use Authorization (EUA). This EUA will remain  in effect (meaning this test  can be used) for the duration of the COVID-19 declaration under Section 564(b)(1) of the Act, 21 U.S.C.section 360bbb-3(b)(1), unless the authorization is terminated  or revoked sooner.       Influenza A by PCR NEGATIVE NEGATIVE Final   Influenza B by PCR NEGATIVE NEGATIVE Final    Comment: (NOTE) The Xpert Xpress SARS-CoV-2/FLU/RSV plus assay is intended as an aid in the diagnosis of influenza from Nasopharyngeal swab specimens and should not be used as a sole basis for treatment. Nasal washings and aspirates are unacceptable for Xpert Xpress SARS-CoV-2/FLU/RSV testing.  Fact Sheet for Patients: BloggerCourse.com  Fact Sheet for Healthcare Providers: SeriousBroker.it  This test is not yet approved or cleared by the [place]  FDA and has been authorized for detection and/or diagnosis of SARS-CoV-2 by FDA under an Emergency Use Authorization (EUA). This EUA will remain in effect (meaning this test can be used) for the duration of the COVID-19 declaration under Section 564(b)(1) of the Act, 21 U.S.C. section 360bbb-3(b)(1), unless the authorization is terminated or revoked.     Resp Syncytial Virus by PCR NEGATIVE NEGATIVE Final    Comment: (NOTE) Fact Sheet for Patients: BloggerCourse.com  Fact Sheet for Healthcare Providers: SeriousBroker.it  This test is not yet approved or cleared by the [place]  FDA and has been authorized for detection and/or diagnosis of SARS-CoV-2 by FDA under an Emergency Use  Authorization (EUA). This EUA will remain in effect (meaning this test can be used) for the duration of the COVID-19 declaration under Section 564(b)(1) of the Act, 21 U.S.C. section 360bbb-3(b)(1), unless the authorization is terminated or revoked.  Performed at [facility], [address] [place], [place] [address]      Labs: BNP (last 3 results) Recent Labs    10/10/23 1446  BNP 104.0*   Basic Metabolic Panel: Recent Labs  Lab 10/10/23 1446 10/11/23 0627 10/12/23 0810  NA 135 135 136  K 4.0 4.3 3.8  CL 104 106 107  CO2 17* 17* 17*  GLUCOSE 195* 287* 193*  BUN 96* 105* 110*  CREATININE 4.35* 4.20* 4.03*  CALCIUM  8.9 8.4* 8.2*   Liver Function Tests: No results for input(s): "AST", "ALT", "ALKPHOS", "BILITOT", "PROT", "ALBUMIN " in the last 168 hours. No results for input(s): "LIPASE", "AMYLASE" in the last 168 hours. No results for input(s): "AMMONIA" in the last 168 hours. CBC: Recent Labs  Lab 10/10/23 1446 10/11/23 0627  WBC 5.9 2.7*  HGB 9.7* 8.6*  HCT 30.8* 26.3*  MCV 91.9 88.9  PLT 212 176   Cardiac Enzymes: No results for input(s): "CKTOTAL", "CKMB", "CKMBINDEX", "TROPONINI" in the last 168 hours. BNP: Invalid input(s): "POCBNP" CBG: Recent Labs  Lab 10/11/23 1150 10/11/23 1738 10/11/23 2029 10/12/23 0748 10/12/23 0913  GLUCAP 274* 278* 209* 171* 225*   D-Dimer Recent Labs    10/10/23 1630  DDIMER 1.52*   Hgb A1c No results for input(s): "HGBA1C" in the last 72 hours. Lipid Profile No results for input(s): "CHOL", "HDL", "LDLCALC", "TRIG", "CHOLHDL", "LDLDIRECT" in the last 72 hours. Thyroid  function studies No results for input(s): "TSH", "T4TOTAL", "T3FREE", "THYROIDAB" in the last 72 hours.  Invalid input(s): "FREET3" Anemia work up No results for input(s): "VITAMINB12", "FOLATE", "FERRITIN", "TIBC", "IRON ", "RETICCTPCT" in the last 72 hours. Urinalysis    Component Value Date/Time   COLORURINE STRAW (A)  09/17/2022 1432   APPEARANCEUR CLEAR (A) 09/17/2022 1432   APPEARANCEUR Clear 03/29/2022 1504   LABSPEC 1.009 09/17/2022 1432   LABSPEC 1.005 03/06/2013 4132  PHURINE 5.0 09/17/2022 1432   GLUCOSEU NEGATIVE 09/17/2022 1432   GLUCOSEU Negative 03/06/2013 0929   HGBUR NEGATIVE 09/17/2022 1432   BILIRUBINUR NEGATIVE 09/17/2022 1432   BILIRUBINUR Negative 03/29/2022 1504   BILIRUBINUR Negative 03/06/2013 0929   KETONESUR NEGATIVE 09/17/2022 1432   PROTEINUR 100 (A) 09/17/2022 1432   UROBILINOGEN 0.2 02/20/2019 1404   NITRITE NEGATIVE 09/17/2022 1432   LEUKOCYTESUR NEGATIVE 09/17/2022 1432   LEUKOCYTESUR Negative 03/06/2013 0929   Sepsis Labs Recent Labs  Lab 10/10/23 1446 10/11/23 0627  WBC 5.9 2.7*   Microbiology Recent Results (from the past 240 hours)  Resp panel by RT-PCR (RSV, Flu A&B, Covid) Anterior Nasal Swab     Status: None   Collection Time: 10/10/23  4:30 PM   Specimen: Anterior Nasal Swab  Result Value Ref Range Status   SARS Coronavirus 2 by RT PCR NEGATIVE NEGATIVE Final    Comment: (NOTE) SARS-CoV-2 target nucleic acids are NOT DETECTED.  The SARS-CoV-2 RNA is generally detectable in upper respiratory specimens during the acute phase of infection. The lowest concentration of SARS-CoV-2 viral copies this assay can detect is 138 copies/mL. A negative result does not preclude SARS-Cov-2 infection and should not be used as the sole basis for treatment or other patient management decisions. A negative result may occur with  improper specimen collection/handling, submission of specimen other than nasopharyngeal swab, presence of viral mutation(s) within the areas targeted by this assay, and inadequate number of viral copies(<138 copies/mL). A negative result must be combined with clinical observations, patient history, and epidemiological information. The expected result is Negative.  Fact Sheet for Patients:  BloggerCourse.com  Fact  Sheet for Healthcare Providers:  SeriousBroker.it  This test is no t yet approved or cleared by the [place]  FDA and  has been authorized for detection and/or diagnosis of SARS-CoV-2 by FDA under an Emergency Use Authorization (EUA). This EUA will remain  in effect (meaning this test can be used) for the duration of the COVID-19 declaration under Section 564(b)(1) of the Act, 21 U.S.C.section 360bbb-3(b)(1), unless the authorization is terminated  or revoked sooner.       Influenza A by PCR NEGATIVE NEGATIVE Final   Influenza B by PCR NEGATIVE NEGATIVE Final    Comment: (NOTE) The Xpert Xpress SARS-CoV-2/FLU/RSV plus assay is intended as an aid in the diagnosis of influenza from Nasopharyngeal swab specimens and should not be used as a sole basis for treatment. Nasal washings and aspirates are unacceptable for Xpert Xpress SARS-CoV-2/FLU/RSV testing.  Fact Sheet for Patients: BloggerCourse.com  Fact Sheet for Healthcare Providers: SeriousBroker.it  This test is not yet approved or cleared by the [place]  FDA and has been authorized for detection and/or diagnosis of SARS-CoV-2 by FDA under an Emergency Use Authorization (EUA). This EUA will remain in effect (meaning this test can be used) for the duration of the COVID-19 declaration under Section 564(b)(1) of the Act, 21 U.S.C. section 360bbb-3(b)(1), unless the authorization is terminated or revoked.     Resp Syncytial Virus by PCR NEGATIVE NEGATIVE Final    Comment: (NOTE) Fact Sheet for Patients: BloggerCourse.com  Fact Sheet for Healthcare Providers: SeriousBroker.it  This test is not yet approved or cleared by the [place]  FDA and has been authorized for detection and/or diagnosis of SARS-CoV-2 by FDA under an Emergency Use Authorization (EUA). This EUA will remain in effect  (meaning this test can be used) for the duration of the COVID-19 declaration under Section 564(b)(1) of the Act, 21 U.S.C.  section 360bbb-3(b)(1), unless the authorization is terminated or revoked.  Performed at [facility] [address], [place], [place] [address]    Imaging NM Pulmonary Perfusion Result Date: 10/11/2023 CLINICAL DATA:  Chest pain with inspiration. EXAM: NUCLEAR MEDICINE PERFUSION LUNG SCAN TECHNIQUE: Perfusion images were obtained in multiple projections after intravenous injection of radiopharmaceutical. Ventilation scans intentionally deferred if perfusion scan and chest x-ray adequate for interpretation during COVID 19 epidemic. RADIOPHARMACEUTICALS:  4.3 mCi Tc-74m MAA IV COMPARISON:  09/23/2022 FINDINGS: No focal segmental or larger peripheral wedge-shaped filling defect in either lung. IMPRESSION: Negative for pulmonary embolism. Electronically Signed   By: Donnal Fusi M.D.   On: 10/11/2023 09:43   DG Chest 2 View Result Date: 10/10/2023 CLINICAL DATA:  Chest pain with inspiration EXAM: CHEST - 2 VIEW COMPARISON:  01/09/2023 FINDINGS: Frontal and lateral views of the chest demonstrate an unremarkable cardiac silhouette. No acute airspace disease, effusion, or pneumothorax. No acute bony abnormalities. IMPRESSION: 1. No acute intrathoracic process. Electronically Signed   By: Bobbye Burrow M.D.   On: 10/10/2023 15:44      Time coordinating discharge: over 30 minutes  SIGNED:  Esteban Kobashigawa DO [facility]

## 2023-10-12 NOTE — Plan of Care (Signed)

## 2023-10-12 NOTE — Evaluation (Signed)
 Occupational Therapy Evaluation Patient Details Name: Larry Callahan MRN: [id number] DOB: 07-02-1955 Today's Date: 10/12/2023   History of Present Illness   Larry Callahan is a 68 y.o. male with medical history significant for CAD, CKD 4, HTN, DM, COPD, OSA on CPAP, HFrEF secondary to NICM (EF 40-45%), atrial flutter s/p ablation (05/2023 )on Eliquis , history of upper GI bleed (03/2023 ), who presented with 2-day history of cough productive of yellow phlegm, shortness of breath and on the day of arrival developed chest pain with deep inspiration.    Clinical Impressions Larry Callahan was seen for OT evaluation this date. Prior to hospital admission, pt was MOD I using rollator. Pt lives with nephew in level entry home. Pt currently MOD I to don underwear, cues to initiate as pt is hyper verbal. Pt tolerated ADL t/f ~100 ft MOD I  using RW, SpO2 100% on RA t/o session. Constant tangential speech with difficulty attending to conversation including not answering orientation questions directly. No skilled acute OT needs identified, will sign off. Upon hospital discharge, recommend no OT follow up.    If plan is discharge home, recommend the following:   Supervision due to cognitive status     Functional Status Assessment   Patient has not had a recent decline in their functional status     Equipment Recommendations   None recommended by OT     Recommendations for Other Services         Precautions/Restrictions   Precautions Precautions: Fall Recall of Precautions/Restrictions: Impaired Restrictions Weight Bearing Restrictions Per Provider Order: No     Mobility Bed Mobility Overal bed mobility: Modified Independent                  Transfers Overall transfer level: Modified independent Equipment used: Rolling walker (2 wheels)                      Balance Overall balance assessment: No apparent balance deficits (not formally assessed)                                          ADL either performed or assessed with clinical judgement   ADL Overall ADL's : Modified independent                                       General ADL Comments: don underwear and tolerated ADL t/f ~100 ft.        Pertinent Vitals/Pain Pain Assessment Pain Assessment: No/denies pain     Extremity/Trunk Assessment Upper Extremity Assessment Upper Extremity Assessment: Overall WFL for tasks assessed   Lower Extremity Assessment Lower Extremity Assessment: Overall WFL for tasks assessed   Cervical / Trunk Assessment Cervical / Trunk Assessment: Normal   Communication Communication Communication: Impaired Factors Affecting Communication: Hearing impaired   Cognition Arousal: Alert Behavior During Therapy: WFL for tasks assessed/performed Cognition: No family/caregiver present to determine baseline             OT - Cognition Comments: tangential speech with difficulty attending to conversation including not answering orientation questions directly.                  Following commands: Impaired Following commands impaired: Follows one step commands with increased time     Cueing  General Comments   Cueing Techniques: Verbal cues;Gestural cues  SPO2 100% on RA t/o session           Home Living Family/patient expects to be discharged to:: Private residence Living Arrangements: Other relatives Available Help at Discharge: Family;Available 24 hours/day Type of Home: House Home Access: Level entry     Home Layout: One level               Home Equipment: Rollator (4 wheels)          Prior Functioning/Environment Prior Level of Function : Independent/Modified Independent             Mobility Comments: Ind with rollator ADLs Comments: Ind, or nephew assists as needed    OT Problem List: Decreased activity tolerance        OT Goals(Current goals can be found in  the care plan section)   Acute Rehab OT Goals Patient Stated Goal: to go home OT Goal Formulation: With patient Time For Goal Achievement: 10/12/23 Potential to Achieve Goals: Good   AM-PAC OT "6 Clicks" Daily Activity     Outcome Measure Help from another person eating meals?: None Help from another person taking care of personal grooming?: None Help from another person toileting, which includes using toliet, bedpan, or urinal?: None Help from another person bathing (including washing, rinsing, drying)?: None Help from another person to put on and taking off regular upper body clothing?: None Help from another person to put on and taking off regular lower body clothing?: None 6 Click Score: 24   End of Session Equipment Utilized During Treatment: Rolling walker (2 wheels)  Activity Tolerance: Patient tolerated treatment well Patient left: in chair;with call bell/phone within reach;with chair alarm set  OT Visit Diagnosis: Other abnormalities of gait and mobility (R26.89)                Time: 0454-0981 OT Time Calculation (min): 15 min Charges:  OT General Charges $OT Visit: 1 Visit OT Evaluation $OT Eval Low Complexity: 1 Low  Gordan Latina, M.S. OTR/L  10/12/23, 9:39 AM  ascom [phone]

## 2023-10-12 NOTE — Plan of Care (Signed)

## 2023-10-12 NOTE — Evaluation (Signed)
 Physical Therapy Evaluation Patient Details Name: Larry Callahan MRN: [id number] DOB: 21-Jan-1956 Today's Date: 10/12/2023  History of Present Illness  Larry Callahan is a 68 y.o. male with medical history significant for CAD, CKD 4, HTN, DM, COPD, OSA on CPAP, HFrEF secondary to NICM (EF 40-45%), atrial flutter s/p ablation (05/2023 )on Eliquis , history of upper GI bleed (03/2023 ), who presented with 2-day history of cough productive of yellow phlegm, shortness of breath and on the day of arrival developed chest pain with deep inspiration.  Chest pain is felt in his lower ribs across his chest when he takes a deep breath and when he coughs   Clinical Impression  Patient received in bed, he is hyper verbal. HOH. Patient is mod I with all mobility at this time. Appears to be at baseline level of function. O2 sats throughout session at 100%. Patient does not require skilled PT at this time. Will sign off.          If plan is discharge home, recommend the following: A little help with walking and/or transfers;Assist for transportation   Can travel by private vehicle    yes    Equipment Recommendations None recommended by PT  Recommendations for Other Services       Functional Status Assessment Patient has not had a recent decline in their functional status     Precautions / Restrictions Precautions Precautions: Fall Recall of Precautions/Restrictions: Impaired Restrictions Weight Bearing Restrictions Per Provider Order: No      Mobility  Bed Mobility Overal bed mobility: Modified Independent                  Transfers Overall transfer level: Modified independent Equipment used: Rolling walker (2 wheels)                    Ambulation/Gait Ambulation/Gait assistance: Supervision Gait Distance (Feet): 150 Feet Assistive device: Rolling walker (2 wheels) Gait Pattern/deviations: Step-through pattern, Decreased step length - right, Decreased step  length - left, Trunk flexed Gait velocity: decr     General Gait Details: patient ambulating well with RW, supervision in hall. O2 sats on room air 100% throughout session.  Stairs            Wheelchair Mobility     Tilt Bed    Modified Rankin (Stroke Patients Only)       Balance Overall balance assessment: Modified Independent                                           Pertinent Vitals/Pain Pain Assessment Pain Assessment: No/denies pain    Home Living Family/patient expects to be discharged to:: Private residence Living Arrangements: Other relatives Available Help at Discharge: Family;Available 24 hours/day Type of Home: House Home Access: Level entry       Home Layout: One level Home Equipment: Rollator (4 wheels)      Prior Function Prior Level of Function : Independent/Modified Independent             Mobility Comments: Ind with rollator ADLs Comments: Ind, or nephew assists as needed     Extremity/Trunk Assessment   Upper Extremity Assessment Upper Extremity Assessment: Defer to OT evaluation    Lower Extremity Assessment Lower Extremity Assessment: Overall WFL for tasks assessed    Cervical / Trunk Assessment Cervical / Trunk Assessment: Normal  Communication  Communication Communication: Impaired Factors Affecting Communication: Hearing impaired    Cognition Arousal: Alert Behavior During Therapy: WFL for tasks assessed/performed   PT - Cognitive impairments: Difficult to assess Difficult to assess due to: Hard of hearing/deaf                     PT - Cognition Comments: patient hyper verbal Following commands: Impaired Following commands impaired: Follows one step commands with increased time     Cueing Cueing Techniques: Verbal cues, Gestural cues     General Comments      Exercises     Assessment/Plan    PT Assessment Patient does not need any further PT services  PT Problem List          PT Treatment Interventions      PT Goals (Current goals can be found in the Care Plan section)  Acute Rehab PT Goals Patient Stated Goal: return home PT Goal Formulation: With patient Time For Goal Achievement: 10/19/23 Potential to Achieve Goals: Good    Frequency       Co-evaluation               AM-PAC PT "6 Clicks" Mobility  Outcome Measure Help needed turning from your back to your side while in a flat bed without using bedrails?: None Help needed moving from lying on your back to sitting on the side of a flat bed without using bedrails?: None Help needed moving to and from a bed to a chair (including a wheelchair)?: None Help needed standing up from a chair using your arms (e.g., wheelchair or bedside chair)?: None Help needed to walk in hospital room?: None Help needed climbing 3-5 steps with a railing? : A Little 6 Click Score: 23    End of Session Equipment Utilized During Treatment: Gait belt Activity Tolerance: Patient tolerated treatment well Patient left: in chair;with call bell/phone within reach;with chair alarm set Nurse Communication: Mobility status      Time: 8657-8469 PT Time Calculation (min) (ACUTE ONLY): 15 min   Charges:   PT Evaluation $PT Eval Low Complexity: 1 Low   PT General Charges $$ ACUTE PT VISIT: 1 Visit         Stefannie Defeo, PT, [facility] 10/12/23,9:34 AM

## 2023-10-12 NOTE — Care Management Obs Status (Signed)
 MEDICARE OBSERVATION STATUS NOTIFICATION   Patient Details  Name: Larry Callahan MRN: [id number] Date of Birth: 29-Apr-1956   Medicare Observation Status Notification Given:  Yes    Anise Kerns 10/12/2023, 11:11 AM

## 2023-10-13 DIAGNOSIS — S80212A Abrasion, left knee, initial encounter: Secondary | ICD-10-CM | POA: Insufficient documentation

## 2023-10-13 DIAGNOSIS — S51012A Laceration without foreign body of left elbow, initial encounter: Secondary | ICD-10-CM | POA: Insufficient documentation

## 2023-10-13 DIAGNOSIS — R42 Dizziness and giddiness: Secondary | ICD-10-CM | POA: Insufficient documentation

## 2023-10-13 DIAGNOSIS — D649 Anemia, unspecified: Secondary | ICD-10-CM | POA: Insufficient documentation

## 2023-10-13 DIAGNOSIS — Z7901 Long term (current) use of anticoagulants: Secondary | ICD-10-CM | POA: Diagnosis not present

## 2023-10-13 DIAGNOSIS — Z794 Long term (current) use of insulin: Secondary | ICD-10-CM | POA: Insufficient documentation

## 2023-10-13 DIAGNOSIS — R0602 Shortness of breath: Secondary | ICD-10-CM | POA: Insufficient documentation

## 2023-10-13 DIAGNOSIS — W19XXXA Unspecified fall, initial encounter: Secondary | ICD-10-CM | POA: Insufficient documentation

## 2023-10-13 DIAGNOSIS — E1122 Type 2 diabetes mellitus with diabetic chronic kidney disease: Secondary | ICD-10-CM | POA: Insufficient documentation

## 2023-10-13 DIAGNOSIS — I130 Hypertensive heart and chronic kidney disease with heart failure and stage 1 through stage 4 chronic kidney disease, or unspecified chronic kidney disease: Secondary | ICD-10-CM | POA: Insufficient documentation

## 2023-10-13 DIAGNOSIS — N184 Chronic kidney disease, stage 4 (severe): Secondary | ICD-10-CM | POA: Diagnosis not present

## 2023-10-13 DIAGNOSIS — Z1152 Encounter for screening for COVID-19: Secondary | ICD-10-CM | POA: Diagnosis not present

## 2023-10-13 DIAGNOSIS — E785 Hyperlipidemia, unspecified: Secondary | ICD-10-CM | POA: Insufficient documentation

## 2023-10-13 DIAGNOSIS — G4733 Obstructive sleep apnea (adult) (pediatric): Secondary | ICD-10-CM | POA: Insufficient documentation

## 2023-10-13 DIAGNOSIS — I5023 Acute on chronic systolic (congestive) heart failure: Secondary | ICD-10-CM | POA: Diagnosis not present

## 2023-10-13 DIAGNOSIS — I4892 Unspecified atrial flutter: Secondary | ICD-10-CM | POA: Insufficient documentation

## 2023-10-13 DIAGNOSIS — J441 Chronic obstructive pulmonary disease with (acute) exacerbation: Principal | ICD-10-CM | POA: Insufficient documentation

## 2023-10-13 DIAGNOSIS — R0989 Other specified symptoms and signs involving the circulatory and respiratory systems: Secondary | ICD-10-CM | POA: Diagnosis present

## 2023-10-13 DIAGNOSIS — K219 Gastro-esophageal reflux disease without esophagitis: Secondary | ICD-10-CM | POA: Diagnosis not present

## 2023-10-13 DIAGNOSIS — Z79899 Other long term (current) drug therapy: Secondary | ICD-10-CM | POA: Diagnosis not present

## 2023-10-13 NOTE — ED Triage Notes (Signed)
 Pt in with continued chest congestion symptoms - was admitted for PNA from 4/30-5/1. Pt states he was prescribed meds (lasix  and mucinex ), but did not know he was supposed to pick them up from pharmacy. Denies any fevers, just feels no better.   EMS states pt's driveway was very steep and pt had to walk to get to ambulance. On the way, pt fell in the gravel - has pain to L knee and bandaged laceration to L forearm. Denies any head/neck trauma, does take Eliquis  daily. Hypertensive 170/91

## 2023-10-14 ENCOUNTER — Observation Stay
Admission: EM | Admit: 2023-10-14 | Discharge: 2023-10-17 | Disposition: A | Discharge status: Home / Self Care | Attending: Student | Admitting: Student

## 2023-10-14 ENCOUNTER — Emergency Department

## 2023-10-14 ENCOUNTER — Other Ambulatory Visit: Payer: Self-pay

## 2023-10-14 ENCOUNTER — Encounter: Payer: Self-pay | Admitting: Emergency Medicine

## 2023-10-14 DIAGNOSIS — E872 Acidosis, unspecified: Secondary | ICD-10-CM | POA: Diagnosis not present

## 2023-10-14 DIAGNOSIS — S80212A Abrasion, left knee, initial encounter: Secondary | ICD-10-CM

## 2023-10-14 DIAGNOSIS — N184 Chronic kidney disease, stage 4 (severe): Secondary | ICD-10-CM | POA: Diagnosis present

## 2023-10-14 DIAGNOSIS — I4892 Unspecified atrial flutter: Secondary | ICD-10-CM | POA: Diagnosis present

## 2023-10-14 DIAGNOSIS — G4733 Obstructive sleep apnea (adult) (pediatric): Secondary | ICD-10-CM | POA: Diagnosis present

## 2023-10-14 DIAGNOSIS — I5023 Acute on chronic systolic (congestive) heart failure: Secondary | ICD-10-CM | POA: Diagnosis present

## 2023-10-14 DIAGNOSIS — K219 Gastro-esophageal reflux disease without esophagitis: Secondary | ICD-10-CM | POA: Diagnosis present

## 2023-10-14 DIAGNOSIS — D649 Anemia, unspecified: Secondary | ICD-10-CM | POA: Diagnosis present

## 2023-10-14 DIAGNOSIS — I10 Essential (primary) hypertension: Secondary | ICD-10-CM | POA: Diagnosis present

## 2023-10-14 DIAGNOSIS — Y92009 Unspecified place in unspecified non-institutional (private) residence as the place of occurrence of the external cause: Secondary | ICD-10-CM

## 2023-10-14 DIAGNOSIS — W19XXXA Unspecified fall, initial encounter: Secondary | ICD-10-CM

## 2023-10-14 DIAGNOSIS — E1159 Type 2 diabetes mellitus with other circulatory complications: Secondary | ICD-10-CM

## 2023-10-14 DIAGNOSIS — E785 Hyperlipidemia, unspecified: Secondary | ICD-10-CM | POA: Diagnosis present

## 2023-10-14 DIAGNOSIS — E119 Type 2 diabetes mellitus without complications: Secondary | ICD-10-CM

## 2023-10-14 DIAGNOSIS — J441 Chronic obstructive pulmonary disease with (acute) exacerbation: Principal | ICD-10-CM | POA: Diagnosis present

## 2023-10-14 DIAGNOSIS — S51012A Laceration without foreign body of left elbow, initial encounter: Secondary | ICD-10-CM

## 2023-10-14 LAB — COMPREHENSIVE METABOLIC PANEL WITH GFR
ALT: 13 U/L (ref 0–44)
AST: 16 U/L (ref 15–41)
Albumin: 3.1 g/dL — ABNORMAL LOW (ref 3.5–5.0)
Alkaline Phosphatase: 54 U/L (ref 38–126)
Anion gap: 12 (ref 5–15)
BUN: 122 mg/dL — ABNORMAL HIGH (ref 8–23)
CO2: 14 mmol/L — ABNORMAL LOW (ref 22–32)
Calcium: 8 mg/dL — ABNORMAL LOW (ref 8.9–10.3)
Chloride: 108 mmol/L (ref 98–111)
Creatinine, Ser: 4.17 mg/dL — ABNORMAL HIGH (ref 0.61–1.24)
GFR, Estimated: 15 mL/min — ABNORMAL LOW (ref >60)
Glucose, Bld: 71 mg/dL (ref 70–99)
Potassium: 4.9 mmol/L (ref 3.5–5.1)
Sodium: 134 mmol/L — ABNORMAL LOW (ref 135–145)
Total Bilirubin: 0.7 mg/dL (ref 0.0–1.2)
Total Protein: 6.9 g/dL (ref 6.5–8.1)

## 2023-10-14 LAB — CBC
HCT: 30.2 % — ABNORMAL LOW (ref 39.0–52.0)
Hemoglobin: 9.7 g/dL — ABNORMAL LOW (ref 13.0–17.0)
MCH: 29.3 pg (ref 26.0–34.0)
MCHC: 32.1 g/dL (ref 30.0–36.0)
MCV: 91.2 fL (ref 80.0–100.0)
Platelets: 246 10*3/uL (ref 150–400)
RBC: 3.31 MIL/uL — ABNORMAL LOW (ref 4.22–5.81)
RDW: 15.4 % (ref 11.5–15.5)
WBC: 9.2 10*3/uL (ref 4.0–10.5)
nRBC: 0 % (ref 0.0–0.2)

## 2023-10-14 LAB — BASIC METABOLIC PANEL WITH GFR
Anion gap: 13 (ref 5–15)
BUN: 119 mg/dL — ABNORMAL HIGH (ref 8–23)
CO2: 15 mmol/L — ABNORMAL LOW (ref 22–32)
Calcium: 7.9 mg/dL — ABNORMAL LOW (ref 8.9–10.3)
Chloride: 104 mmol/L (ref 98–111)
Creatinine, Ser: 4.17 mg/dL — ABNORMAL HIGH (ref 0.61–1.24)
GFR, Estimated: 15 mL/min — ABNORMAL LOW (ref >60)
Glucose, Bld: 293 mg/dL — ABNORMAL HIGH (ref 70–99)
Potassium: 4.7 mmol/L (ref 3.5–5.1)
Sodium: 132 mmol/L — ABNORMAL LOW (ref 135–145)

## 2023-10-14 LAB — GLUCOSE, CAPILLARY
Glucose-Capillary: 297 mg/dL — ABNORMAL HIGH (ref 70–99)
Glucose-Capillary: 219 mg/dL — ABNORMAL HIGH (ref 70–99)
Glucose-Capillary: 202 mg/dL — ABNORMAL HIGH (ref 70–99)

## 2023-10-14 LAB — CBG MONITORING, ED
Glucose-Capillary: 74 mg/dL (ref 70–99)
Glucose-Capillary: 206 mg/dL — ABNORMAL HIGH (ref 70–99)

## 2023-10-14 LAB — IRON AND TIBC
Iron: 52 ug/dL (ref 45–182)
Saturation Ratios: 20 % (ref 17.9–39.5)
TIBC: 262 ug/dL (ref 250–450)
UIBC: 210 ug/dL

## 2023-10-14 LAB — BRAIN NATRIURETIC PEPTIDE: B Natriuretic Peptide: 121.3 pg/mL — ABNORMAL HIGH (ref 0.0–100.0)

## 2023-10-14 MED ORDER — APIXABAN 5 MG PO TABS
5.0000 mg | ORAL_TABLET | Freq: Two times a day (BID) | ORAL | Status: DC
  Administered 2023-10-14 – 2023-10-17 (×7): 5 mg via ORAL
  Filled 2023-10-14 (×7): qty 1

## 2023-10-14 MED ORDER — INSULIN GLARGINE-YFGN 100 UNIT/ML ~~LOC~~ SOLN: 20.0000 [IU] | Freq: Every day | SUBCUTANEOUS | Status: DC

## 2023-10-14 MED ORDER — BUDESONIDE 0.5 MG/2ML IN SUSP
2.0000 mg | Freq: Two times a day (BID) | RESPIRATORY_TRACT | Status: DC
  Administered 2023-10-14: 2 mg via RESPIRATORY_TRACT
  Filled 2023-10-14: qty 8

## 2023-10-14 MED ORDER — INSULIN GLARGINE-YFGN 100 UNIT/ML ~~LOC~~ SOLN
15.0000 [IU] | Freq: Every day | SUBCUTANEOUS | Status: DC
  Filled 2023-10-14: qty 0.15

## 2023-10-14 MED ORDER — HYDRALAZINE HCL 50 MG PO TABS
100.0000 mg | ORAL_TABLET | Freq: Three times a day (TID) | ORAL | Status: DC
  Administered 2023-10-14 – 2023-10-17 (×10): 100 mg via ORAL
  Filled 2023-10-14 (×10): qty 2

## 2023-10-14 MED ORDER — INSULIN GLARGINE-YFGN 100 UNIT/ML ~~LOC~~ SOLN
16.0000 [IU] | Freq: Every day | SUBCUTANEOUS | Status: DC
  Filled 2023-10-14: qty 0.16

## 2023-10-14 MED ORDER — ACETAMINOPHEN 325 MG PO TABS
650.0000 mg | ORAL_TABLET | Freq: Four times a day (QID) | ORAL | Status: DC | PRN
  Administered 2023-10-15 (×2): 650 mg via ORAL
  Filled 2023-10-14 (×2): qty 2

## 2023-10-14 MED ORDER — ONDANSETRON HCL 4 MG PO TABS: 4.0000 mg | ORAL_TABLET | Freq: Four times a day (QID) | ORAL | Status: DC | PRN

## 2023-10-14 MED ORDER — GUAIFENESIN ER 600 MG PO TB12
600.0000 mg | ORAL_TABLET | Freq: Two times a day (BID) | ORAL | Status: DC
  Administered 2023-10-14 – 2023-10-17 (×7): 600 mg via ORAL
  Filled 2023-10-14 (×8): qty 1

## 2023-10-14 MED ORDER — BUDESON-GLYCOPYRROL-FORMOTEROL 160-9-4.8 MCG/ACT IN AERO
2.0000 | INHALATION_SPRAY | Freq: Two times a day (BID) | RESPIRATORY_TRACT | Status: DC
  Administered 2023-10-14 – 2023-10-17 (×5): 2 via RESPIRATORY_TRACT
  Filled 2023-10-14: qty 5.9

## 2023-10-14 MED ORDER — METHYLPREDNISOLONE SODIUM SUCC 125 MG IJ SOLR
125.0000 mg | Freq: Once | INTRAMUSCULAR | Status: AC
  Administered 2023-10-14: 125 mg via INTRAVENOUS
  Filled 2023-10-14: qty 2

## 2023-10-14 MED ORDER — IPRATROPIUM-ALBUTEROL 0.5-2.5 (3) MG/3ML IN SOLN: 3.0000 mL | RESPIRATORY_TRACT | Status: DC | PRN

## 2023-10-14 MED ORDER — LORATADINE 10 MG PO TABS
5.0000 mg | ORAL_TABLET | Freq: Every day | ORAL | Status: DC
  Administered 2023-10-14 – 2023-10-17 (×4): 5 mg via ORAL
  Filled 2023-10-14 (×4): qty 1

## 2023-10-14 MED ORDER — INSULIN ASPART 100 UNIT/ML IJ SOLN
0.0000 [IU] | Freq: Three times a day (TID) | INTRAMUSCULAR | Status: DC
  Administered 2023-10-14: 5 [IU] via SUBCUTANEOUS
  Administered 2023-10-14: 8 [IU] via SUBCUTANEOUS
  Administered 2023-10-15: 2 [IU] via SUBCUTANEOUS
  Administered 2023-10-15: 3 [IU] via SUBCUTANEOUS
  Administered 2023-10-15: 11 [IU] via SUBCUTANEOUS
  Administered 2023-10-16: 2 [IU] via SUBCUTANEOUS
  Administered 2023-10-16: 11 [IU] via SUBCUTANEOUS
  Administered 2023-10-17: 3 [IU] via SUBCUTANEOUS
  Filled 2023-10-14 (×9): qty 1

## 2023-10-14 MED ORDER — SODIUM BICARBONATE 8.4 % IV SOLN
100.0000 meq | Freq: Once | INTRAVENOUS | Status: AC
Start: 2023-10-14 — End: 2023-10-14
  Administered 2023-10-14: 100 meq via INTRAVENOUS
  Filled 2023-10-14: qty 50

## 2023-10-14 MED ORDER — METHOCARBAMOL 500 MG PO TABS: 750.0000 mg | ORAL_TABLET | Freq: Four times a day (QID) | ORAL | Status: DC | PRN

## 2023-10-14 MED ORDER — CARVEDILOL 12.5 MG PO TABS
12.5000 mg | ORAL_TABLET | Freq: Two times a day (BID) | ORAL | Status: DC
  Administered 2023-10-14 – 2023-10-17 (×7): 12.5 mg via ORAL
  Filled 2023-10-14: qty 1
  Filled 2023-10-14: qty 2
  Filled 2023-10-14 (×5): qty 1

## 2023-10-14 MED ORDER — HEPARIN SODIUM (PORCINE) 5000 UNIT/ML IJ SOLN: 5000.0000 [IU] | Freq: Three times a day (TID) | INTRAMUSCULAR | Status: DC

## 2023-10-14 MED ORDER — BACITRACIN ZINC 500 UNIT/GM EX OINT
TOPICAL_OINTMENT | Freq: Two times a day (BID) | CUTANEOUS | Status: DC
  Administered 2023-10-15 – 2023-10-16 (×3): 1 via TOPICAL
  Filled 2023-10-14 (×4): qty 0.9

## 2023-10-14 MED ORDER — ONDANSETRON HCL 4 MG/2ML IJ SOLN: 4.0000 mg | Freq: Four times a day (QID) | INTRAMUSCULAR | Status: DC | PRN

## 2023-10-14 MED ORDER — ROSUVASTATIN CALCIUM 10 MG PO TABS
10.0000 mg | ORAL_TABLET | Freq: Every day | ORAL | Status: DC
  Administered 2023-10-14 – 2023-10-16 (×3): 10 mg via ORAL
  Filled 2023-10-14 (×4): qty 1

## 2023-10-14 MED ORDER — MELATONIN 5 MG PO TABS: 5.0000 mg | ORAL_TABLET | Freq: Every evening | ORAL | Status: DC | PRN

## 2023-10-14 MED ORDER — SODIUM BICARBONATE 650 MG PO TABS
650.0000 mg | ORAL_TABLET | Freq: Three times a day (TID) | ORAL | Status: DC
  Administered 2023-10-14 – 2023-10-16 (×5): 650 mg via ORAL
  Filled 2023-10-14 (×5): qty 1

## 2023-10-14 MED ORDER — POLYETHYLENE GLYCOL 3350 17 G PO PACK: 17.0000 g | PACK | Freq: Every day | ORAL | Status: DC | PRN

## 2023-10-14 MED ORDER — INSULIN ASPART 100 UNIT/ML IJ SOLN
0.0000 [IU] | Freq: Every day | INTRAMUSCULAR | Status: DC
  Administered 2023-10-14: 2 [IU] via SUBCUTANEOUS
  Administered 2023-10-15 – 2023-10-16 (×2): 3 [IU] via SUBCUTANEOUS
  Filled 2023-10-14 (×3): qty 1

## 2023-10-14 MED ORDER — IPRATROPIUM-ALBUTEROL 0.5-2.5 (3) MG/3ML IN SOLN
3.0000 mL | Freq: Once | RESPIRATORY_TRACT | Status: AC
  Administered 2023-10-14: 3 mL via RESPIRATORY_TRACT
  Filled 2023-10-14: qty 3

## 2023-10-14 MED ORDER — ACETAMINOPHEN 650 MG RE SUPP: 650.0000 mg | Freq: Four times a day (QID) | RECTAL | Status: DC | PRN

## 2023-10-14 MED ORDER — PANTOPRAZOLE SODIUM 40 MG PO TBEC
40.0000 mg | DELAYED_RELEASE_TABLET | Freq: Every day | ORAL | Status: DC
  Administered 2023-10-14 – 2023-10-17 (×4): 40 mg via ORAL
  Filled 2023-10-14 (×4): qty 1

## 2023-10-14 MED ORDER — INSULIN GLARGINE-YFGN 100 UNIT/ML ~~LOC~~ SOLN
20.0000 [IU] | SUBCUTANEOUS | Status: DC
  Administered 2023-10-14: 20 [IU] via SUBCUTANEOUS
  Filled 2023-10-14: qty 0.2

## 2023-10-14 MED ORDER — FERROUS SULFATE 325 (65 FE) MG PO TABS
325.0000 mg | ORAL_TABLET | Freq: Every evening | ORAL | Status: DC
  Administered 2023-10-14 – 2023-10-16 (×3): 325 mg via ORAL
  Filled 2023-10-14 (×3): qty 1

## 2023-10-14 MED ORDER — PREDNISONE 20 MG PO TABS
40.0000 mg | ORAL_TABLET | Freq: Every day | ORAL | Status: AC
  Administered 2023-10-14 – 2023-10-17 (×4): 40 mg via ORAL
  Filled 2023-10-14 (×4): qty 2

## 2023-10-14 MED ORDER — SODIUM BICARBONATE 8.4 % IV SOLN
100.0000 meq | Freq: Once | INTRAVENOUS | Status: AC
  Administered 2023-10-14: 100 meq via INTRAVENOUS
  Filled 2023-10-14: qty 50

## 2023-10-14 NOTE — H&P (Addendum)
 History and Physical    Patient: Larry Callahan OVF:[id number] DOB: 1955-08-12 DOA: 10/14/2023 DOS: the patient was seen and examined on 10/14/2023 PCP: Laurence Pons, NP  Patient coming from: Home  Chief Complaint:  Chief Complaint  Patient presents with   Chest Congestion   Fall   HPI: Larry Callahan is a 68 y.o. 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  He presents to [facility] regional ED after being home for 1 day postdischarge 10/12/23 with COPD exacerbation at that time. He called EMS because he has continued to feel short of breath at home and did not feel that he returned to his pre-admission baseline . He notes that he has not been taking all of his medications since discharge and reports he did not know that he needed to pick up any medications, he also does not have access to his nebulizer machine at home. Of note he lives up a steep [place] and EMS was unable to drive the ambulance to his front door, while EMTs assisted him to the ambulance he had a fall while walking down the driveway and found to have 2 abrasions: 1 on his knee and 1 on his elbow. No headstrike, he is on Eliquis .  ED Course: On arrival to [facility] regional ED patient was noted to be afebrile temp 36.6 C, BP 170/91, HR 72, RR 18, SpO2 98% on room air.  Labs notable for CO2 14, BUN 122, creatinine 4.17, albumin  3.1, hemoglobin 9.7 which is in line with recent baseline, normal iron  and TIBC and BNP 121.3.  CXR negative.  He was given DuoNebs and 125 mg of methylprednisolone . [facility] contacted for admission.  Review of Systems: As mentioned in the history of present illness. All other systems reviewed and are negative. Past Medical History:  Diagnosis Date   Allergy    Atrial flutter (HCC)    a. Dx 12/2020--CHA2DS2VASc = 5-->eliquis .    Chronic combined systolic (congestive) and diastolic (congestive) heart failure (HCC)    a. 04/2019 Echo: EF 45-50%; b. 02/2019 Echo: EF 55-60%; c. 09/2020 Echo: EF 35-40%, glob HK. Nl RV size/fxn. Mild MR; d. 07/2021 Echo: EF 40-45%, mild LVH, low-nl RV fxn, no MR.   CKD (chronic kidney disease), stage III (HCC)    COPD (chronic obstructive pulmonary disease) (HCC)    Coronary artery disease    a. 2011 Cath: nonobs dzs; b. 09/2020 MV: EF 38%. No signif ischemia. ? inf MI vs diaph attenuation. GI uptake artifact.   Diabetes mellitus without complication (HCC)    GERD (gastroesophageal reflux disease)    History of CVA (cerebrovascular accident) 09/25/2020   Hyperlipidemia    Hypertension    Morbid (severe) obesity due to excess calories (HCC) 06/09/2017   Morbid obesity (HCC)    NICM (nonischemic cardiomyopathy) (HCC)    a. 04/2019 Echo: EF 45-50%; b. 02/2019 Echo: EF 55-60%; c. 09/2020 Echo: EF 35-40%, glob HK; d. 09/2020 MV: No ischemia. ? inf infarct vs attenuation; d. 07/2021 Echo: EF 40-45%.   OSA (obstructive sleep apnea)    Pneumonia due to COVID-19 virus 01/30/2020   Stage 3b chronic kidney disease (HCC)    Stroke (HCC) 2024   Syncope and collapse 11/16/2020   Past Surgical History:  Procedure Laterality Date   CARDIAC CATHETERIZATION     CHOLECYSTECTOMY  COLONOSCOPY WITH PROPOFOL  N/A 04/12/2019   Procedure: COLONOSCOPY WITH PROPOFOL ;  Surgeon: Selena Daily, MD;  Location: [facility] ENDOSCOPY;  Service: Gastroenterology;  Laterality: N/A;   ESOPHAGOGASTRODUODENOSCOPY N/A 04/12/2019   Procedure: ESOPHAGOGASTRODUODENOSCOPY (EGD);  Surgeon: Selena Daily, MD;  Location: [facility] ENDOSCOPY;  Service: Gastroenterology;  Laterality: N/A;   ESOPHAGOGASTRODUODENOSCOPY (EGD) WITH PROPOFOL  N/A 11/21/2019   Procedure: ESOPHAGOGASTRODUODENOSCOPY (EGD) WITH PROPOFOL ;  Surgeon: Selena Daily, MD;  Location: [facility] ENDOSCOPY;  Service: Gastroenterology;  Laterality: N/A;    ESOPHAGOGASTRODUODENOSCOPY (EGD) WITH PROPOFOL  N/A 01/11/2022   Procedure: ESOPHAGOGASTRODUODENOSCOPY (EGD) WITH PROPOFOL ;  Surgeon: Marnee Sink, MD;  Location: [name] ENDOSCOPY;  Service: Endoscopy;  Laterality: N/A;   ESOPHAGOGASTRODUODENOSCOPY (EGD) WITH PROPOFOL  N/A 08/02/2022   Procedure: ESOPHAGOGASTRODUODENOSCOPY (EGD) WITH PROPOFOL ;  Surgeon: Marnee Sink, MD;  Location: [name] ENDOSCOPY;  Service: Endoscopy;  Laterality: N/A;   FLEXIBLE BRONCHOSCOPY Bilateral 05/17/2019   Procedure: FLEXIBLE BRONCHOSCOPY;  Surgeon: Cordie Deters, MD;  Location: [name] ORS;  Service: Pulmonary;  Laterality: Bilateral;   left arm surgery     nephrectomy Left    PARATHYROIDECTOMY     RIGHT HEART CATH N/A 03/29/2019   Procedure: RIGHT HEART CATH;  Surgeon: Devorah Fonder, MD;  Location: [name] INVASIVE CV LAB;  Service: Cardiovascular;  Laterality: N/A;   Social History:  reports that he quit smoking about 3 years ago. His smoking use included cigarettes. He has quit using smokeless tobacco.  His smokeless tobacco use included chew. He reports that he does not drink alcohol and does not use drugs.  Allergies  Allergen Reactions   Penicillins Rash   Codeine  Nausea And Vomiting    Family History  Problem Relation Age of Onset   Diabetes Mother        2003   Lung cancer Father    Diabetes Sister    Hypertension Sister    Heart disease Sister    Cancer Sister    Bone cancer Brother    Prostate cancer Neg Hx    Bladder Cancer Neg Hx    Kidney cancer Neg Hx     Prior to Admission medications   Medication Sig Start Date End Date Taking? Authorizing Provider  acetaminophen  (TYLENOL ) 325 MG tablet Take 2 tablets (650 mg total) by mouth every 6 (six) hours as needed for mild pain or moderate pain (or Fever >/= 101). 08/03/22  Yes Althia Atlas, MD  albuterol  (VENTOLIN  HFA) 108 (90 Base) MCG/ACT inhaler Inhale 1-2 puffs into the lungs every 4 (four) hours as needed for wheezing or shortness of breath.  10/12/23  Yes Alexander, Natalie, DO  apixaban  (ELIQUIS ) 5 MG TABS tablet Take 1 tablet (5 mg total) by mouth 2 (two) times daily. 10/05/23  Yes Abernathy, Janeice Medal, NP  budeson-glycopyrrolate -formoterol  (BREZTRI  AEROSPHERE) 160-9-4.8 MCG/ACT AERO inhaler Inhale 2 puffs into the lungs 2 (two) times daily. 10/05/23  Yes Abernathy, Janeice Medal, NP  carvedilol  (COREG ) 12.5 MG tablet Take 1 tablet (12.5 mg total) by mouth 2 (two) times daily. 10/05/23  Yes Abernathy, Janeice Medal, NP  cyanocobalamin  1000 MCG tablet Take 1 tablet (1,000 mcg total) by mouth daily. 10/05/23  Yes Abernathy, Janeice Medal, NP  diphenhydrAMINE  (BENADRYL ) 25 mg capsule Take 1 capsule (25 mg total) by mouth every 8 (eight) hours as needed for itching. 10/12/23  Yes Alexander, Natalie, DO  furosemide  (LASIX ) 40 MG tablet Take 1 tablet (40 mg total) by mouth daily. 10/05/23  Yes Abernathy, Janeice Medal, NP  insulin  glargine, 2 Unit Dial , (TOUJEO  MAX SOLOSTAR) 300 UNIT/ML Solostar Pen  Inject 16 Units into the skin at bedtime. Inject 20 units in am 09/18/23  Yes Abernathy, Alyssa, NP  ipratropium-albuterol  (DUONEB) 0.5-2.5 (3) MG/3ML SOLN Take 3 mLs by nebulization every 6 (six) hours as needed. 10/05/23  Yes Abernathy, Janeice Medal, NP  methocarbamol  (ROBAXIN ) 750 MG tablet Take 1 tablet (750 mg total) by mouth every 6 (six) hours as needed for muscle spasms. 10/05/23  Yes Abernathy, Janeice Medal, NP  pantoprazole  (PROTONIX ) 40 MG tablet Take 40 mg by mouth 2 (two) times daily. 09/03/23  Yes [provider]  potassium chloride  SA (KLOR-CON  M) 20 MEQ tablet Take 1 tablet (20 mEq total) by mouth 2 (two) times daily. 10/05/23  Yes Abernathy, Janeice Medal, NP  predniSONE  (DELTASONE ) 20 MG tablet Take 2 tablets (40 mg total) by mouth daily with breakfast for 3 days. 10/13/23 10/16/23 Yes Alexander, Natalie, DO  rosuvastatin  (CRESTOR ) 10 MG tablet Take 1 tablet (10 mg total) by mouth at bedtime. 09/18/23  Yes Laurence Pons, NP  ACCU-CHEK GUIDE test strip Use to check blood sugars 4 times a  day E11.65 03/08/23   Laurence Pons, NP  Accu-Chek Softclix Lancets lancets Use to check blood sugars 4 times a day   E11.65 08/20/21   Laurence Pons, NP  amLODipine  (NORVASC ) 5 MG tablet Take 5 mg by mouth 2 (two) times daily.    [provider]  atorvastatin  (LIPITOR) 10 MG tablet Take 10 mg by mouth daily.    [provider]  Blood Glucose Monitoring Suppl (TRUE METRIX AIR GLUCOSE METER) DEVI 1 Device by Does not apply route 2 (two) times daily. 04/13/18   Sharyon Deis, NP  ferrous sulfate  325 (65 FE) MG tablet Take 325 mg by mouth every evening.    [provider]  guaiFENesin  (MUCINEX ) 600 MG 12 hr tablet Take 2 tablets (1,200 mg total) by mouth 2 (two) times daily for 7 days. 10/12/23 10/19/23  Alexander, Natalie, DO  Insulin  lispro (HUMALOG  JUNIOR KWIKPEN) 100 UNIT/ML Inject 0-10 Units into the skin 3 (three) times daily.    [provider]  Insulin  Pen Needle (NOVOFINE PEN NEEDLE) 32G X 6 MM MISC Use as directed with toujeo  pen 09/08/23   Laurence Pons, NP  iron  polysaccharides (NIFEREX) 150 MG capsule Take 1 capsule (150 mg total) by mouth daily. 09/24/22 12/23/22  Althia Atlas, MD  loratadine  (CLARITIN ) 10 MG tablet Take 1 tablet (10 mg total) by mouth daily. 10/12/23   Alexander, Natalie, DO  omeprazole  (PRILOSEC) 40 MG capsule Take 1 capsule (40 mg total) by mouth in the morning and at bedtime. 09/18/23   Abernathy, Alyssa, NP  polyethylene glycol powder (GLYCOLAX /MIRALAX ) 17 GM/SCOOP powder Take 17 g by mouth daily. 10/12/23   Alexander, Natalie, DO  Skin Protectants, Misc. (PERIGUARD EX) Apply 1 Application topically in the morning, at noon, and at bedtime.    [provider]    Physical Exam: Vitals:   10/14/23 0700 10/14/23 0823 10/14/23 0827 10/14/23 1000  BP: (!) 167/75 121/70  122/66  Pulse: 77 72  81  Resp: 18   16  Temp:   97.6 F (36.4 C)   TempSrc:   Oral   SpO2: 99%   100%  Weight:      Height:       Constitutional:  Elderly caucasian gentleman, NAD Eyes: PERRL, lids and conjunctivae normal ENMT: Mucous membranes are moist. Posterior pharynx clear of any exudate or lesions. Neck: normal, supple, no masses, no thyromegaly Respiratory: clear to auscultation bilaterally, no  wheezing, no crackles. Normal respiratory effort. No accessory muscle use.  Cardiovascular: Regular rate and rhythm, no murmurs / rubs / gallops. No extremity edema. 2+ pedal pulses. No carotid bruits.  Abdomen: no tenderness, no masses palpated. No hepatosplenomegaly. Bowel sounds positive x4 quadrants.  Musculoskeletal: no clubbing / cyanosis. No joint deformity upper and lower extremities. Good ROM, no contractures. Normal muscle tone.  Skin: Warm, dry.  Neurologic: CN 2-12 grossly intact. Sensation intact, Alert and oriented x 3. Normal mood.   Data Reviewed: CMP     Component Value Date/Time   NA 134 (L) 10/14/2023 0004   NA 136 07/15/2021 1151   NA 138 03/06/2013 0621   K 4.9 10/14/2023 0004   K 3.9 03/06/2013 0621   CL 108 10/14/2023 0004   CL 107 03/06/2013 0621   CO2 14 (L) 10/14/2023 0004   CO2 23 03/06/2013 0621   GLUCOSE 71 10/14/2023 0004   GLUCOSE 157 (H) 03/06/2013 0621   BUN 122 (H) 10/14/2023 0004   BUN 41 (H) 07/15/2021 1151   BUN 19 (H) 03/06/2013 0621   CREATININE 4.17 (H) 10/14/2023 0004   CREATININE 1.01 03/06/2013 0621   CALCIUM  8.0 (L) 10/14/2023 0004   CALCIUM  9.3 03/06/2013 0621   PROT 6.9 10/14/2023 0004   PROT 6.7 07/15/2021 1151   PROT 7.3 03/06/2013 0621   ALBUMIN  3.1 (L) 10/14/2023 0004   ALBUMIN  3.4 (L) 07/15/2021 1151   ALBUMIN  3.4 03/06/2013 0621   AST 16 10/14/2023 0004   AST 22 03/06/2013 0621   ALT 13 10/14/2023 0004   ALT 24 03/06/2013 0621   ALKPHOS 54 10/14/2023 0004   ALKPHOS 99 03/06/2013 0621   BILITOT 0.7 10/14/2023 0004   BILITOT 0.3 07/15/2021 1151   BILITOT 0.4 03/06/2013 0621   EGFR 28 (L) 07/15/2021 1151   GFRNONAA 15 (L) 10/14/2023 0004   GFRNONAA >60 03/06/2013  0621   CBC    Component Value Date/Time   WBC 9.2 10/14/2023 0004   RBC 3.31 (L) 10/14/2023 0004   HGB 9.7 (L) 10/14/2023 0004   HGB 11.0 (L) 02/27/2018 0844   HCT 30.2 (L) 10/14/2023 0004   HCT 33.4 (L) 02/27/2018 0844   PLT 246 10/14/2023 0004   PLT 248 02/27/2018 0844   MCV 91.2 10/14/2023 0004   MCV 79 02/27/2018 0844   MCV 90 03/06/2013 0621   MCH 29.3 10/14/2023 0004   MCHC 32.1 10/14/2023 0004   RDW 15.4 10/14/2023 0004   RDW 15.6 (H) 02/27/2018 0844   RDW 13.9 03/06/2013 0621   LYMPHSABS 0.8 09/28/2022 1557   LYMPHSABS 2.2 02/27/2018 0844   LYMPHSABS 1.8 12/08/2012 0539   MONOABS 0.2 09/28/2022 1557   MONOABS 1.0 12/08/2012 0539   EOSABS 0.1 09/28/2022 1557   EOSABS 0.3 02/27/2018 0844   EOSABS 0.2 12/08/2012 0539   BASOSABS 0.0 09/28/2022 1557   BASOSABS 0.1 02/27/2018 0844   BASOSABS 0.1 12/08/2012 0539   BNP    Component Value Date/Time   BNP 121.3 (H) 10/14/2023 0004   Assessment and Plan: #COPD with Acute Exacerbation - PO Prednisone  - Continue home Breztri  - Mucinex  - Supplemental O2 as needed - Pulmonary toilet: Mobilize/incentive spirometry/flutter valve   #Fall with Skin Abrasions - Fall Precautions - Bacitracin   #Type II Diabetes Mellitus - Continue home basal insulin  - AC/HS SSI and CBG monitoring while inpatient  #Chronic systolic CHF - Continue Coreg  and Hydralazine  - Continue to hold Lasix  - Strick I/Os - Daily weights  #CKD stage IV #  Chronic Metabolic Acidosis Creatinine 4.17, in line with recent baseline. CO2 14, slightly lower than recent baseline. - Avoid nephrotoxins, Contrast Dyes, Hypotension and Dehydration t - Renally Adjust Meds - Repeat BMP with AM labs - Sodium Bicarb, not on outpatient PO bicarb Addendum Started oral bicarbonate 650 mg p.o. 3 times daily Follow repeat BMP in the afternoon and replete bicarbonate as per report   #Hypertension - Continue Home Coreg  and Hydralazine   #OSA - Continue home  CPAP  #Atrial Flutter -Continue home Coreg  and Eliquis     VTE prophylaxis: Eliquis   GI prophylaxis: Protonix  Diet: Carb Modified Access: PIV Lines: NONE Code Status: FULL Telemetry: Yes Disposition: Observe on Med-Surg with Telemetry Patient is from: Home  Anticipated d/c is to: Home  Anticipated d/c date is: Tomorrow  Advance Care Planning:   Code Status: Full Code   Consults: N/A  Family Communication: Nephew Oral updated via phone  Severity of Illness: The appropriate patient status for this patient is OBSERVATION. Observation status is judged to be reasonable and necessary in order to provide the required intensity of service to ensure the patient's safety. The patient's presenting symptoms, physical exam findings, and initial radiographic and laboratory data in the context of their medical condition is felt to place them at decreased risk for further clinical deterioration. Furthermore, it is anticipated that the patient will be medically stable for discharge from the hospital within 2 midnights of admission.   To reach the provider On-Call:   7AM- 7PM see care teams to locate the attending and reach out to them via [email or web address] Password: [email or web address] 7PM-7AM contact night-coverage If you still have difficulty reaching the appropriate provider, please page the [facility] (Director on Call) for [facility] on amion for assistance  This document was prepared using Conservation officer, historic buildings and may include unintentional dictation errors.  Naida Austria FNP-BC, PMHNP-BC Nurse Practitioner [facility]

## 2023-10-14 NOTE — Hospital Course (Signed)
 Larry Callahan is a 69 y.o. 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  He presents to [facility] regional ED after being home for 1 day postdischarge 10/12/23 with COPD exacerbation at that time.  He called EMS because he continued to feel short of breath at home and did not feel that he returned to his baseline pre-hospitalization.  He notes that he has not been taking all of his medications since discharge and reports he did not note that he needed to pick up any medications, he also does not have access to his nebulizer machine at home. Of note he lives up a steep [place] and EMS was unable to drive the truck to his front door, while EMTs assisted him to the ambulance he had a fall while walking down the driveway and found to have 2 abrasions 1 on his knee and 1 on his elbow.

## 2023-10-14 NOTE — Evaluation (Signed)
 Occupational Therapy Evaluation Patient Details Name: Larry Callahan MRN: [id number] DOB: 06-17-1955 Today's Date: 10/14/2023   History of Present Illness   Pt is a 68 y.o. male presents to ED after being home for 1 day postdischarge 10/12/23 with COPD exacerbation. Pt fell with EMS staff trying to get in ambulance to get here. PMH of CAD, CKD 4, HTN, DM, COPD, OSA on CPAP, HFrEF secondary to NICM (EF 40-45%), atrial flutter s/p ablation (05/2023 ) on Eliquis , history of upper GI bleed (03/2023).     Clinical Impressions Pt was seen for OT evaluation this date. PTA, pt lives at home with his nephew who is not in good health. Pt reports ambulates with a rollator and is IND/MOD I with ADLs and IADLs at baseline.   Pt presents to acute OT demonstrating impaired ADL performance and functional mobility 2/2 mild balance deficits and low activity tolerance with safety awareness deficits. Pt currently requires SBA for in room mobility without AD use to wash his hands. SUP for UB/LB dressing tasks via seated lateral leans and standing at bedside. Sp02 100% throughout session and pt able to return to supine with supervision. Edu on ECS and PLB/pacing during ADL performance to prevent overexertion and maximize safety. Mild balance deficits noted with standing activities, but no overt LOB during session.  Pt would benefit from skilled OT services to address noted impairments and functional limitations to maximize safety and independence while minimizing falls risk and caregiver burden. Do anticipate the need for follow up OT services upon acute hospital DC.      If plan is discharge home, recommend the following:   Supervision due to cognitive status;Assistance with cooking/housework;A little help with bathing/dressing/bathroom     Functional Status Assessment   Patient has had a recent decline in their functional status and demonstrates the ability to make significant improvements in function in  a reasonable and predictable amount of time.     Equipment Recommendations   None recommended by OT     Recommendations for Other Services         Precautions/Restrictions   Precautions Precautions: Fall Recall of Precautions/Restrictions: Impaired Restrictions Weight Bearing Restrictions Per Provider Order: No     Mobility Bed Mobility Overal bed mobility: Needs Assistance Bed Mobility: Sit to Supine       Sit to supine: Modified independent (Device/Increase time)        Transfers Overall transfer level: Needs assistance   Transfers: Sit to/from Stand Sit to Stand: Supervision           General transfer comment: no AD use from bedside for dressing tasks; amb in room without AD with SBA; uses rollator at home      Balance Overall balance assessment: Needs assistance   Sitting balance-Leahy Scale: Good       Standing balance-Leahy Scale: Fair                             ADL either performed or assessed with clinical judgement   ADL Overall ADL's : Modified independent                                       General ADL Comments: doffed underwear, pants and shirt and donned new mesh undies and gown with SUP/MOD I     Vision  Perception         Praxis         Pertinent Vitals/Pain Pain Assessment Pain Assessment: Faces Faces Pain Scale: Hurts a little bit Pain Location: L knee abrasion Pain Descriptors / Indicators: Sore Pain Intervention(s): Monitored during session     Extremity/Trunk Assessment Upper Extremity Assessment Upper Extremity Assessment: Overall WFL for tasks assessed   Lower Extremity Assessment Lower Extremity Assessment: Overall WFL for tasks assessed       Communication Communication Communication: Impaired Factors Affecting Communication: Hearing impaired   Cognition Arousal: Alert Behavior During Therapy: WFL for tasks assessed/performed Cognition: No  family/caregiver present to determine baseline             OT - Cognition Comments: hyperverbal with need for redirection                 Following commands: Impaired Following commands impaired: Follows one step commands with increased time     Cueing  General Comments   Cueing Techniques: Verbal cues;Gestural cues  sp02 100% on RA during session   Exercises Other Exercises Other Exercises: Edu on role of OT in acute setting.   Shoulder Instructions      Home Living Family/patient expects to be discharged to:: Private residence Living Arrangements: Other relatives Available Help at Discharge: Family;Available 24 hours/day (reporst nephew is not in good health) Type of Home: House Home Access: Level entry     Home Layout: One level     Bathroom Shower/Tub: Chief Strategy Officer: Standard     Home Equipment: Rollator (4 wheels);BSC/3in1          Prior Functioning/Environment Prior Level of Function : Independent/Modified Independent             Mobility Comments: Ind with rollator ADLs Comments: IND    OT Problem List: Decreased activity tolerance;Impaired balance (sitting and/or standing)   OT Treatment/Interventions: Self-care/ADL training;Balance training;Therapeutic exercise;Therapeutic activities;DME and/or AE instruction;Patient/family education      OT Goals(Current goals can be found in the care plan section)   Acute Rehab OT Goals Patient Stated Goal: return home OT Goal Formulation: With patient Time For Goal Achievement: 10/28/23 Potential to Achieve Goals: Good ADL Goals Pt Will Transfer to Toilet: with modified independence;ambulating Pt Will Perform Toileting - Clothing Manipulation and hygiene: with modified independence;sitting/lateral leans;sit to/from stand Additional ADL Goal #1: Pt will demo implementation of 1 learned ECS during ADL performance 2/2 trials to pervent overexertion and maximize safety.    OT Frequency:  Min 1X/week    Co-evaluation              AM-PAC OT "6 Clicks" Daily Activity     Outcome Measure Help from another person eating meals?: None Help from another person taking care of personal grooming?: None Help from another person toileting, which includes using toliet, bedpan, or urinal?: A Little Help from another person bathing (including washing, rinsing, drying)?: A Little Help from another person to put on and taking off regular upper body clothing?: None Help from another person to put on and taking off regular lower body clothing?: None 6 Click Score: 22   End of Session Equipment Utilized During Treatment: Rolling walker (2 wheels) Nurse Communication: Mobility status  Activity Tolerance: Patient tolerated treatment well Patient left: in bed;with bed alarm set;with call bell/phone within reach  OT Visit Diagnosis: Other abnormalities of gait and mobility (R26.89)  Time: 8413-2440 OT Time Calculation (min): 17 min Charges:  OT General Charges $OT Visit: 1 Visit OT Evaluation $OT Eval Moderate Complexity: 1 Mod Hai Grabe, OTR/L 10/14/23, 3:16 PM  Vincie Linn E Derius Ghosh 10/14/2023, 3:14 PM

## 2023-10-14 NOTE — Evaluation (Signed)
 Physical Therapy Evaluation Patient Details Name: Larry Callahan MRN: [id number] DOB: Jun 24, 1955 Today's Date: 10/14/2023  History of Present Illness  Pt is a 68 y.o. male presents to ED after being home for 1 day postdischarge 10/12/23 with COPD exacerbation. Pt fell with EMS staff trying to get in ambulance to get here. PMH of CAD, CKD 4, HTN, DM, COPD, OSA on CPAP, HFrEF secondary to NICM (EF 40-45%), atrial flutter s/p ablation (05/2023 ) on Eliquis , history of upper GI bleed (03/2023).   Clinical Impression  Pt is a 68 year old M admitted to hospital on 10/14/23 for COPD exacerbation. At baseline, pt is mod I with ambulation using RW, ADL's, and IADL's. Endorses difficulty with IADL's. Reports he has limited assistance from nephew as he is not in good health himself.   Pt presents with decreased activity tolerance, decreased standing balance, and decreased safety awareness, resulting in impaired functional mobility from baseline. Due to deficits, pt required mod assist for bed mobility, SBA for transfers with RW, and SBA to ambulate a total of 168ft with RW. SpO2 at 100% on RA throughout session despite audible wheezing and c/o dyspnea.   Deficits limit the pt's ability to safely and independently perform ADL's, transfer, and ambulate. Pt will benefit from acute skilled PT services to address deficits for return to baseline function. Pt will benefit from post acute therapy services to address deficits for return to baseline function.         If plan is discharge home, recommend the following: A little help with walking and/or transfers;Assist for transportation;A little help with bathing/dressing/bathroom;Assistance with cooking/housework;Help with stairs or ramp for entrance;Supervision due to cognitive status   Can travel by private vehicle   Yes    Equipment Recommendations  (shower chair)     Functional Status Assessment Patient has not had a recent decline in their functional  status     Precautions / Restrictions Precautions Precautions: Fall Recall of Precautions/Restrictions: Impaired Restrictions Weight Bearing Restrictions Per Provider Order: No      Mobility  Bed Mobility   Bed Mobility: Supine to Sit     Supine to sit: Mod assist     General bed mobility comments: mod assist for trunk facilitation to sit EOB, HOB slightly elevated, use of BUE for support. Left sitting EOB with OT at end of session.    Transfers Overall transfer level: Needs assistance Equipment used: Rolling walker (2 wheels) Transfers: Sit to/from Stand Sit to Stand: Supervision           General transfer comment: Supervision for safety to perform multiple STS from EOB with RW, reliant on momentum to facilitate transfer. Increased time/effort to achieve full upright standing and for transition of UE from EOB>RW. Verbal cues for safety, sequencing, and hand placement.    Ambulation/Gait Ambulation/Gait assistance: Supervision Gait Distance (Feet): 165 Feet (20ft x1, 141ft x1) Assistive device: Rolling walker (2 wheels)         General Gait Details: Supervision for safety and vitals monitoring to ambulate in room and hallway with RW. Demonstrates decreased RW proximity, narrow BOS, bilateral hip ER, and decreased step length/foot clearance bilaterally. SpO2 at 100% during both bouts of gait despite labored breathing.    Balance     Sitting balance-Leahy Scale: Good       Standing balance-Leahy Scale: Fair Standing balance comment: BUE support on RW  Pertinent Vitals/Pain Pain Assessment Pain Assessment: 0-10 Pain Score: 5  Pain Location: L knee and arm abrasion from fall Pain Descriptors / Indicators: Sore Pain Intervention(s): Monitored during session    Home Living Family/patient expects to be discharged to:: Private residence Living Arrangements: Other relatives Available Help at Discharge: Family;Available  24 hours/day (reports nephew is not in good health and that nephew says "you need to learn to help me too") Type of Home: House Home Access: Level entry       Home Layout: One level Home Equipment: Rollator (4 wheels);BSC/3in1;Wheelchair - manual      Prior Function Prior Level of Function : Independent/Modified Independent             Mobility Comments: Ind with rollator ADLs Comments: IND     Extremity/Trunk Assessment   Upper Extremity Assessment Upper Extremity Assessment: Overall WFL for tasks assessed (limited R hand digit extension, attempted for PROM with wrist flexion but unable to tolerate secondary to pain. Reports that this is due to "stroke" but no hx of stroke located in chart/imaging)    Lower Extremity Assessment Lower Extremity Assessment: Overall WFL for tasks assessed    Cervical / Trunk Assessment Cervical / Trunk Assessment: Normal  Communication   Communication Communication: Impaired Factors Affecting Communication: Hearing impaired    Cognition Arousal: Alert Behavior During Therapy: WFL for tasks assessed/performed                             Following commands: Impaired Following commands impaired: Follows one step commands with increased time     Cueing Cueing Techniques: Verbal cues, Gestural cues     General Comments General comments (skin integrity, edema, etc.): SpO2 at 100% throughout session on RA, audible wheezing, HR ranging from 85-100bpm during session, with elevated HR after gait.    Exercises Other Exercises Other Exercises: Pt educated re: PT role/POC, DC recommendations, safety with functional mobility, AD for energy conservation/decreased risk of falls, pursed lip breathing, call for help. He verbalized understanding.   Assessment/Plan    PT Assessment Patient needs continued PT services  PT Problem List Decreased activity tolerance;Decreased balance;Decreased mobility;Decreased safety  awareness;Cardiopulmonary status limiting activity       PT Treatment Interventions DME instruction;Gait training;Functional mobility training;Therapeutic activities;Therapeutic exercise;Balance training;Neuromuscular re-education    PT Goals (Current goals can be found in the Care Plan section)  Acute Rehab PT Goals Patient Stated Goal: return home PT Goal Formulation: With patient Time For Goal Achievement: 10/28/23 Potential to Achieve Goals: Fair    Frequency Min 2X/week        AM-PAC PT "6 Clicks" Mobility  Outcome Measure Help needed turning from your back to your side while in a flat bed without using bedrails?: None Help needed moving from lying on your back to sitting on the side of a flat bed without using bedrails?: None Help needed moving to and from a bed to a chair (including a wheelchair)?: A Little Help needed standing up from a chair using your arms (e.g., wheelchair or bedside chair)?: A Little Help needed to walk in hospital room?: A Little Help needed climbing 3-5 steps with a railing? : A Little 6 Click Score: 20    End of Session Equipment Utilized During Treatment: Gait belt Activity Tolerance: Patient tolerated treatment well Patient left: in bed (left in care of OT) Nurse Communication: Mobility status PT Visit Diagnosis: Unsteadiness on feet (R26.81);History of falling (Z91.81)  Time: 8119-1478 PT Time Calculation (min) (ACUTE ONLY): 27 min   Charges:   PT Evaluation $PT Eval Moderate Complexity: 1 Mod PT Treatments $Therapeutic Activity: 8-22 mins PT General Charges $$ ACUTE PT VISIT: 1 Visit         Branda Cain, PT, DPT 3:58 PM,10/14/23 Physical Therapist - [facility]

## 2023-10-14 NOTE — ED Provider Notes (Signed)
 [facility] Provider Note    Event Date/Time   First MD Initiated Contact with Patient 10/14/23 0136     (approximate)   History   Chest Congestion and Fall   HPI  Larry Callahan is a 68 y.o. male   Past medical history of COPD and CHF, history of CVA, CKD, CAD, here with shortness of breath and a fall from home.  He was seen in the hospital recently with shortness of breath and diagnosed with COPD exacerbation was discharged just yesterday.  He says that he did not feel ready for discharge as he was very weak, continued to be short of breath, and was unsure what medications he had to take at home and did not have access to his nebulizer machine.  He lives with his nephew.  Since being at home he continues to feel short of breath.  He was walking up his driveway and felt short of breath and fell on the gravel skinning his left knee and left elbow.  He did not hit his head.    He denies chest pain but has an ongoing cough.  He denies fevers or chills.  He is unsure whether he is taking his medications as prescribed, does not know the name of his medications, was supposed to be taking prednisone  but he thinks that his family doctor tells him not to take this medication so he is unsure whether he picked it up and he is unsure whether he is taking it.  He does not have access to his nebulizer machine  External Medical Documents Reviewed: Discharge summary from 10/12/2023 when he admitted for COPD exacerbation.  During that hospitalization got a VQ scan which was negative for PE      Physical Exam   Triage Vital Signs: ED Triage Vitals [10/14/23 0001]  Encounter Vitals Group     BP (!) 170/91     Systolic BP Percentile      Diastolic BP Percentile      Pulse Rate 72     Resp 18     Temp 97.9 F (36.6 C)     Temp Source Oral     SpO2 98 %     Weight 180 lb (81.6 kg)     Height      Head Circumference      Peak Flow      Pain Score      Pain  Loc      Pain Education      Exclude from Growth Chart     Most recent vital signs: Vitals:   10/14/23 0001  BP: (!) 170/91  Pulse: 72  Resp: 18  Temp: 97.9 F (36.6 C)  SpO2: 98%    General: Awake, no distress.  CV:  Good peripheral perfusion.  Resp:  Normal effort. Abd:  No distention.  Other:  He has wheezing in all lung fields.  No respiratory distress.  Hypertensive otherwise vital signs normal.  He has skin tear to his left elbow but no bony tenderness no deformity is neurovascular intact and able to range.  He is able to range the left knee.  No bony tenderness but there is some abrasion overlying the anterior portion.   ED Results / Procedures / Treatments   Labs (all labs ordered are listed, but only abnormal results are displayed) Labs Reviewed  COMPREHENSIVE METABOLIC PANEL WITH GFR - Abnormal; Notable for the following components:      Result Value  Sodium 134 (*)    CO2 14 (*)    BUN 122 (*)    Creatinine, Ser 4.17 (*)    Calcium  8.0 (*)    Albumin  3.1 (*)    GFR, Estimated 15 (*)    All other components within normal limits  CBC - Abnormal; Notable for the following components:   RBC 3.31 (*)    Hemoglobin 9.7 (*)    HCT 30.2 (*)    All other components within normal limits  BRAIN NATRIURETIC PEPTIDE - Abnormal; Notable for the following components:   B Natriuretic Peptide 121.3 (*)    All other components within normal limits     I ordered and reviewed the above labs they are notable for his white blood cell count is normal.  His creatinine is elevated but consistent with his baseline at 4.    RADIOLOGY I independently reviewed and interpreted chest x-ray and I see no obvious focality pneumothorax I also reviewed radiologist's formal read.   PROCEDURES:  Critical Care performed: No  Procedures   MEDICATIONS ORDERED IN ED: Medications  ipratropium-albuterol  (DUONEB) 0.5-2.5 (3) MG/3ML nebulizer solution 3 mL (has no administration in  time range)  methylPREDNISolone  sodium succinate (SOLU-MEDROL ) 125 mg/2 mL injection 125 mg (has no administration in time range)    External physician / consultants:  I spoke with hospital medicine for admission and regarding care plan for this patient.   IMPRESSION / MDM / ASSESSMENT AND PLAN / ED COURSE  I reviewed the triage vital signs and the nursing notes.                                Patient's presentation is most consistent with acute presentation with potential threat to life or bodily function.  Differential diagnosis includes, but is not limited to, COPD exacerbation, respiratory infection, considered but less likely ACS or PE.  Blood traumatic injury to the elbow and knee, skin tears there, consider bony injury like fractures or dislocation.   The patient is on the cardiac monitor to evaluate for evidence of arrhythmia and/or significant heart rate changes.  MDM:    His ongoing COPD exacerbation feels weak and had a fall due to his shortness of breath after being discharged from the hospital yesterday.  He has evidence of COPD exacerbation with wheezing.  I will treat with Solu-Medrol  and nebulizer DuoNeb.  He had a fall resulting in skin tear of his left elbow but negative for traumatic bony injuries on x-ray of the knee and elbow.  No head strike reported by patient his CT scan of the head was negative for intracranial bleeding.  Doubt ACS given no chest pain.  Doubt PE given her recent VQ scan showed negative.  Readmission.      FINAL CLINICAL IMPRESSION(S) / ED DIAGNOSES   Final diagnoses:  COPD with acute exacerbation (HCC)  Skin tear of left elbow without complication, initial encounter  Abrasion of left knee, initial encounter     Rx / DC Orders   ED Discharge Orders     None        Note:  This document was prepared using Dragon voice recognition software and may include unintentional dictation errors.    Buell Carmin, MD 10/14/23 [phone]

## 2023-10-15 DIAGNOSIS — R42 Dizziness and giddiness: Secondary | ICD-10-CM | POA: Diagnosis not present

## 2023-10-15 LAB — GLUCOSE, CAPILLARY
Glucose-Capillary: 124 mg/dL — ABNORMAL HIGH (ref 70–99)
Glucose-Capillary: 154 mg/dL — ABNORMAL HIGH (ref 70–99)
Glucose-Capillary: 313 mg/dL — ABNORMAL HIGH (ref 70–99)
Glucose-Capillary: 261 mg/dL — ABNORMAL HIGH (ref 70–99)

## 2023-10-15 LAB — PHOSPHORUS: Phosphorus: 5.8 mg/dL — ABNORMAL HIGH (ref 2.5–4.6)

## 2023-10-15 LAB — BASIC METABOLIC PANEL WITH GFR
Anion gap: 12 (ref 5–15)
BUN: 122 mg/dL — ABNORMAL HIGH (ref 8–23)
CO2: 20 mmol/L — ABNORMAL LOW (ref 22–32)
Calcium: 7.9 mg/dL — ABNORMAL LOW (ref 8.9–10.3)
Chloride: 107 mmol/L (ref 98–111)
Creatinine, Ser: 4.07 mg/dL — ABNORMAL HIGH (ref 0.61–1.24)
GFR, Estimated: 15 mL/min — ABNORMAL LOW (ref >60)
Glucose, Bld: 196 mg/dL — ABNORMAL HIGH (ref 70–99)
Potassium: 4.2 mmol/L (ref 3.5–5.1)
Sodium: 138 mmol/L (ref 135–145)

## 2023-10-15 LAB — HEMOGLOBIN AND HEMATOCRIT, BLOOD
HCT: 26.5 % — ABNORMAL LOW (ref 39.0–52.0)
Hemoglobin: 8.7 g/dL — ABNORMAL LOW (ref 13.0–17.0)

## 2023-10-15 LAB — CBC
HCT: 24.2 % — ABNORMAL LOW (ref 39.0–52.0)
Hemoglobin: 8 g/dL — ABNORMAL LOW (ref 13.0–17.0)
MCH: 29.6 pg (ref 26.0–34.0)
MCHC: 33.1 g/dL (ref 30.0–36.0)
MCV: 89.6 fL (ref 80.0–100.0)
Platelets: 187 10*3/uL (ref 150–400)
RBC: 2.7 MIL/uL — ABNORMAL LOW (ref 4.22–5.81)
RDW: 15.2 % (ref 11.5–15.5)
WBC: 6.3 10*3/uL (ref 4.0–10.5)
nRBC: 0 % (ref 0.0–0.2)

## 2023-10-15 LAB — MAGNESIUM: Magnesium: 1.8 mg/dL (ref 1.7–2.4)

## 2023-10-15 LAB — CK: Total CK: 34 U/L — ABNORMAL LOW (ref 49–397)

## 2023-10-15 MED ORDER — INSULIN GLARGINE-YFGN 100 UNIT/ML ~~LOC~~ SOLN
16.0000 [IU] | Freq: Every day | SUBCUTANEOUS | Status: DC
  Administered 2023-10-15 – 2023-10-16 (×2): 16 [IU] via SUBCUTANEOUS
  Filled 2023-10-15 (×2): qty 0.16

## 2023-10-15 MED ORDER — INSULIN GLARGINE-YFGN 100 UNIT/ML ~~LOC~~ SOLN
20.0000 [IU] | Freq: Every day | SUBCUTANEOUS | Status: DC
  Administered 2023-10-15 – 2023-10-17 (×3): 20 [IU] via SUBCUTANEOUS
  Filled 2023-10-15 (×3): qty 0.2

## 2023-10-15 NOTE — Care Management Obs Status (Signed)
 MEDICARE OBSERVATION STATUS NOTIFICATION   Patient Details  Name: Larry Callahan MRN: [id number] Date of Birth: May 01, 1956   Medicare Observation Status Notification Given:  Yes    Anise Kerns 10/15/2023, 11:48 AM

## 2023-10-15 NOTE — Progress Notes (Signed)
   10/15/23 1200  Vitals  Patient Position (if appropriate) Orthostatic Vitals  Orthostatic Lying   BP- Lying 152/65  Pulse- Lying 60  Orthostatic Sitting  BP- Sitting 165/76  Pulse- Sitting 56    Unable to collect furhter orthostatics d/t patient being unable to tolerate standing, patient became very dizzy and unsteady on his feet.

## 2023-10-15 NOTE — Progress Notes (Signed)
 [facility] Progress Note  Patient: Larry Callahan    ZOX:[id number]  DOA: 10/14/2023     Date of Service: the patient was seen and examined on 10/15/2023  Chief Complaint  Patient presents with   Chest Congestion   Fall   Brief hospital course: Larry Callahan is a 68 y.o. 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  He presents to [facility] ED after being home for 1 day postdischarge 10/12/23 with COPD exacerbation at that time. He called EMS because he has continued to feel short of breath at home and did not feel that he returned to his pre-admission baseline . He notes that he has not been taking all of his medications since discharge and reports he did not know that he needed to pick up any medications, he also does not have access to his nebulizer machine at home. Of note he lives up a steep driveway and EMS was unable to drive the ambulance to his front door, while EMTs assisted him to the ambulance he had a fall while walking down the driveway and found to have 2 abrasions: 1 on his knee and 1 on his elbow. No headstrike, he is on Eliquis .   ED Course: On arrival to [facility] ED patient was noted to be afebrile temp 36.6 C, BP 170/91, HR 72, RR 18, SpO2 98% on room air.  Labs notable for CO2 14, BUN 122, creatinine 4.17, albumin  3.1, hemoglobin 9.7 which is in line with recent baseline, normal iron  and TIBC and BNP 121.3.  CXR negative.  He was given DuoNebs and 125 mg of methylprednisolone . [facility] contacted for admission.   Assessment and Plan:  # CKD stage IV # Acute on Chronic Metabolic Acidosis Patient presented with shortness of breath, most likely acidotic breathing due to very low CO2 Creatinine 4.17, in line with recent baseline. CO2 14, slightly lower than recent baseline. - Avoid  nephrotoxins, Contrast Dyes, Hypotension and Dehydration  - Renally Adjust Meds S/p 100 mEQ Sodium Bicarb x 2 dose on 5/3 Pt was not on outpatient PO bicarb, so started oral bicarbonate 650 mg p.o. 3 times daily - Repeat BMP daily  # Dizziness and frequent falls Check orthostatic BP 5/4 orthostatics within normal range while sitting but patient could not stand up to take vitals, he suddenly became very dizzy as per RN Patient is at high risk for recurrent falls and readmission so we will continue to monitor today   # COPD with Acute Exacerbation - PO Prednisone  - Continue home Breztri  - Mucinex  - Supplemental O2 as needed - Pulmonary toilet: Mobilize/incentive spirometry/flutter valve     # Type II Diabetes Mellitus - Continue home basal insulin  - AC/HS SSI and CBG monitoring while inpatient   # Chronic systolic CHF - Continue Coreg  and Hydralazine  - Continue to hold Lasix  - Strick I/Os - Daily weights     # Hypertension - Continue Home Coreg  and Hydralazine    # OSA - Continue home CPAP   # Atrial Flutter -Continue home Coreg  and Eliquis   # Fall with Skin Abrasions - Fall Precautions - Bacitracin   # Anemia of chronic disease, baseline Hb around 9 Iron  profile within normal range Hb 9.7--8.0--8.7 Monitor H&H  Body mass index is 27.77 kg/m.  Interventions:   Diet: Carb modified diet DVT Prophylaxis:  Therapeutic Anticoagulation with Eliquis     Advance goals of care discussion: Full code  Family Communication: family was not present at bedside, at the time of interview.  The pt provided permission to discuss medical plan with the family. Opportunity was given to ask question and all questions were answered satisfactorily.   Disposition:  Pt is from Home, admitted with metabolic acidosis, dizziness and fall, still has dizziness and remains at high risk for fall and readmission, which precludes a safe discharge. Discharge to home versus SNF, TBD after PT and  OT eval, when stable, may need 1-2 more days to improve.  Subjective: No significant events overnight, shortness of breath is getting better.  RN was advised to check orthostatics to plan for discharge Got a text message from RN that patient could not stand up for the vitals, he became very dizzy while standing up. So it is very risky to discharge him due to risk of fall and readmission.  We will continue to monitor today and discharge tomorrow a.m. if remains stable.   Physical Exam: General: NAD, lying comfortably Appear in no distress, affect appropriate Eyes: PERRLA ENT: Oral Mucosa Clear, moist  Neck: no JVD,  Cardiovascular: S1 and S2 Present, no Murmur,  Respiratory: good respiratory effort, Bilateral Air entry equal and Decreased, no Crackles, no wheezes Abdomen: Bowel Sound present, Soft and no tenderness,  Skin: no rashes Extremities: no Pedal edema, no calf tenderness Neurologic: without any new focal findings Gait not checked due to patient safety concerns  Vitals:   10/14/23 2023 10/15/23 0500 10/15/23 0533 10/15/23 0813  BP: (!) 155/69  (!) 151/66 (!) 162/74  Pulse: 71  71 62  Resp: 20  20 18   Temp: (!) 97.5 F (36.4 C)  97.8 F (36.6 C) 98 F (36.7 C)  TempSrc: Oral  Oral   SpO2: 100%  100% 99%  Weight:  90.3 kg    Height:        Intake/Output Summary (Last 24 hours) at 10/15/2023 1359 Last data filed at 10/15/2023 1157 Gross per 24 hour  Intake 600 ml  Output 2600 ml  Net -2000 ml   Filed Weights   10/14/23 0001 10/14/23 0538 10/15/23 0500  Weight: 81.6 kg 90 kg 90.3 kg    Data Reviewed: I have personally reviewed and interpreted daily labs, tele strips, imagings as discussed above. I reviewed all nursing notes, pharmacy notes, vitals, pertinent old records I have discussed plan of care as described above with RN and patient/family.  CBC: Recent Labs  Lab 10/10/23 1446 10/11/23 0627 10/14/23 0004 10/15/23 0435 10/15/23 1142  WBC 5.9 2.7* 9.2  6.3  --   HGB 9.7* 8.6* 9.7* 8.0* 8.7*  HCT 30.8* 26.3* 30.2* 24.2* 26.5*  MCV 91.9 88.9 91.2 89.6  --   PLT 212 176 246 187  --    Basic Metabolic Panel: Recent Labs  Lab 10/11/23 0627 10/12/23 0810 10/14/23 0004 10/14/23 1746 10/15/23 0435  NA 135 136 134* 132* 138  K 4.3 3.8 4.9 4.7 4.2  CL 106 107 108 104 107  CO2 17* 17* 14* 15* 20*  GLUCOSE 287* 193* 71 293* 196*  BUN 105* 110* 122* 119* 122*  CREATININE 4.20* 4.03* 4.17* 4.17* 4.07*  CALCIUM  8.4* 8.2* 8.0* 7.9* 7.9*  MG  --   --   --   --  1.8  PHOS  --   --   --   --  5.8*    Studies: No results found.  Scheduled Meds:  apixaban   5 mg Oral BID   bacitracin    Topical BID   budeson-glycopyrrolate -formoterol   2 puff Inhalation BID   carvedilol   12.5 mg Oral BID   ferrous sulfate   325 mg Oral QPM   guaiFENesin   600 mg Oral BID   hydrALAZINE   100 mg Oral TID   insulin  aspart  0-15 Units Subcutaneous TID WC   insulin  aspart  0-5 Units Subcutaneous QHS   insulin  glargine-yfgn  20 Units Subcutaneous Daily   And   insulin  glargine-yfgn  16 Units Subcutaneous QHS   loratadine   5 mg Oral Daily   pantoprazole   40 mg Oral Daily   predniSONE   40 mg Oral Q breakfast   rosuvastatin   10 mg Oral QHS   sodium bicarbonate   650 mg Oral TID   Continuous Infusions: PRN Meds: acetaminophen  **OR** acetaminophen , ipratropium-albuterol , melatonin, methocarbamol , ondansetron  **OR** ondansetron  (ZOFRAN ) IV, polyethylene glycol  Time spent: 55 minutes  Author: Althia Atlas. MD [facility] 10/15/2023 1:59 PM  To reach On-call, see care teams to locate the attending and reach out to them via [email or web address]. If 7PM-7AM, please contact night-coverage If you still have difficulty reaching the attending provider, please page the [facility] (Director on Call) for [facility] on amion for assistance.

## 2023-10-15 NOTE — Progress Notes (Signed)
 Patient currently has no IV. Per Dr. Hubert Madden, patient okay to not have peripheral IV at this time.

## 2023-10-15 NOTE — Plan of Care (Signed)

## 2023-10-16 DIAGNOSIS — N184 Chronic kidney disease, stage 4 (severe): Secondary | ICD-10-CM

## 2023-10-16 LAB — PHOSPHORUS: Phosphorus: 4.5 mg/dL (ref 2.5–4.6)

## 2023-10-16 LAB — CBC
HCT: 24.5 % — ABNORMAL LOW (ref 39.0–52.0)
Hemoglobin: 7.8 g/dL — ABNORMAL LOW (ref 13.0–17.0)
MCH: 28.7 pg (ref 26.0–34.0)
MCHC: 31.8 g/dL (ref 30.0–36.0)
MCV: 90.1 fL (ref 80.0–100.0)
Platelets: 200 10*3/uL (ref 150–400)
RBC: 2.72 MIL/uL — ABNORMAL LOW (ref 4.22–5.81)
RDW: 15.2 % (ref 11.5–15.5)
WBC: 7.3 10*3/uL (ref 4.0–10.5)
nRBC: 0 % (ref 0.0–0.2)

## 2023-10-16 LAB — BASIC METABOLIC PANEL WITH GFR
Anion gap: 10 (ref 5–15)
BUN: 125 mg/dL — ABNORMAL HIGH (ref 8–23)
CO2: 19 mmol/L — ABNORMAL LOW (ref 22–32)
Calcium: 7.4 mg/dL — ABNORMAL LOW (ref 8.9–10.3)
Chloride: 107 mmol/L (ref 98–111)
Creatinine, Ser: 3.71 mg/dL — ABNORMAL HIGH (ref 0.61–1.24)
GFR, Estimated: 17 mL/min — ABNORMAL LOW (ref >60)
Glucose, Bld: 217 mg/dL — ABNORMAL HIGH (ref 70–99)
Potassium: 4.1 mmol/L (ref 3.5–5.1)
Sodium: 136 mmol/L (ref 135–145)

## 2023-10-16 LAB — GLUCOSE, CAPILLARY
Glucose-Capillary: 105 mg/dL — ABNORMAL HIGH (ref 70–99)
Glucose-Capillary: 145 mg/dL — ABNORMAL HIGH (ref 70–99)
Glucose-Capillary: 321 mg/dL — ABNORMAL HIGH (ref 70–99)
Glucose-Capillary: 245 mg/dL — ABNORMAL HIGH (ref 70–99)

## 2023-10-16 LAB — MAGNESIUM: Magnesium: 1.9 mg/dL (ref 1.7–2.4)

## 2023-10-16 MED ORDER — SODIUM BICARBONATE 650 MG PO TABS
1300.0000 mg | ORAL_TABLET | Freq: Three times a day (TID) | ORAL | Status: DC
  Administered 2023-10-16 – 2023-10-17 (×3): 1300 mg via ORAL
  Filled 2023-10-16 (×3): qty 2

## 2023-10-16 MED ORDER — SODIUM BICARBONATE 650 MG PO TABS
650.0000 mg | ORAL_TABLET | Freq: Once | ORAL | Status: AC
  Administered 2023-10-16: 650 mg via ORAL
  Filled 2023-10-16: qty 1

## 2023-10-16 NOTE — TOC Initial Note (Signed)
 Transition of Care [facility]) - Initial/Assessment Note    Patient Details  Name: Larry Callahan MRN: [id number] Date of Birth: 1955-09-19  Transition of Care [facility]) CM/SW Contact:    Odilia Bennett, LCSW Phone Number: 10/16/2023, 2:19 PM  Clinical Narrative:  CSW met with patient. No family at bedside. CSW introduced role and explained that therapy recommendations would be discussed. He is agreeable to home health. He last worked with [facility] in 2024 and 2023. They have accepted referral for PT, OT, aide. DME recommendation for 3-in-1. Patient said he already has an elevated toilet seat and shower chair. No further concerns. CSW will continue to follow patient for support and facilitate return home once stable. Nephew will transport him home at discharge.                Expected Discharge Plan: Home w Home Health Services Barriers to Discharge: Continued Medical Work up   Patient Goals and CMS Choice     Choice offered to / list presented to : Patient      Expected Discharge Plan and Services     Post Acute Care Choice: Home Health Living arrangements for the past 2 months: Single Family Home                           HH Arranged: PT, OT, Nurse's Aide HH Agency: [facility] Date [facility] Agency Contacted: 10/16/23   Representative spoke with at [facility] Agency: Melvern Stack  Prior Living Arrangements/Services Living arrangements for the past 2 months: Single Family Home Lives with:: Relatives Patient language and need for interpreter reviewed:: Yes Do you feel safe going back to the place where you live?: Yes      Need for Family Participation in Patient Care: Yes (Comment) Care giver support system in place?: Yes (comment) Current home services: DME Criminal Activity/Legal Involvement Pertinent to Current Situation/Hospitalization: No - Comment as needed  Activities of Daily Living   ADL Screening (condition at time of admission) Independently performs ADLs?: Yes  (appropriate for developmental age) Is the patient deaf or have difficulty hearing?: No Does the patient have difficulty seeing, even when wearing glasses/contacts?: No Does the patient have difficulty concentrating, remembering, or making decisions?: No  Permission Sought/Granted Permission sought to share information with : Facility Industrial/product designer granted to share information with : Yes, Verbal Permission Granted     Permission granted to share info w AGENCY: Home Health Agencies        Emotional Assessment Appearance:: Appears stated age Attitude/Demeanor/Rapport: Engaged, Gracious Affect (typically observed): Accepting, Appropriate, Calm, Pleasant Orientation: : Oriented to Self, Oriented to Place, Oriented to  Time, Oriented to Situation Alcohol / Substance Use: Not Applicable Psych Involvement: No (comment)  Admission diagnosis:  COPD exacerbation (HCC) [J44.1] COPD with acute exacerbation (HCC) [J44.1] Abrasion of left knee, initial encounter [S80.212A] Skin tear of left elbow without complication, initial encounter [S51.012A] Patient Active Problem List   Diagnosis Date Noted   COPD exacerbation (HCC) 10/14/2023   Fall at home, initial encounter 10/14/2023   COPD with acute exacerbation (HCC) 10/10/2023   Metabolic acidosis, normal anion gap (NAG) 10/10/2023   Positive D dimer 09/23/2022   Hemoptysis 09/23/2022   Left arm numbness 09/13/2022   Acute upper GI bleed 07/31/2022   Chest pain 07/31/2022   Hematoma of left flank 07/31/2022   Acute on chronic anemia 07/31/2022   Renal mass 07/31/2022   Upper GI bleed  Dizziness and giddiness 01/04/2022   Umbilical hernia 01/04/2022   Acute on chronic systolic CHF (congestive heart failure) (HCC) 08/05/2021   Acquired thrombophilia (HCC) 06/28/2021   Anemia of chronic kidney failure 06/28/2021   PAD (peripheral artery disease) (HCC)    Cellulitis of right arm 03/02/2021   Atrial flutter (HCC)  01/07/2021   Demand ischemia (HCC)    Influenza A 10/30/2020   HLD (hyperlipidemia) 10/06/2020   Gastroesophageal reflux disease without esophagitis 04/19/2020   Elevated troponin I level 01/29/2020   Hematemesis 01/09/2020   Hospital discharge follow-up 11/26/2019   Obesity (BMI 30-39.9) 08/08/2019   Acute right hip pain 06/17/2019   Inability to ambulate due to hip 06/17/2019   CKD (chronic kidney disease) stage 4, GFR 15-29 ml/min (HCC)    Hypervolemia    Full code status 05/20/2019   Pleuritic chest pain    Acute on chronic heart failure with preserved ejection fraction (HFpEF) (HCC)    Chronic systolic CHF (congestive heart failure) (HCC) 03/01/2019   Iron  deficiency anemia 12/31/2018   Atopic dermatitis 11/24/2018   Screening for colon cancer 11/06/2017   Type 2 diabetes mellitus (HCC) 11/06/2017   Hidradenitis 06/09/2017   Atherosclerotic heart disease of native coronary artery without angina pectoris 06/09/2017   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   Obstructive sleep apnea, adult 06/09/2017   Cigarette nicotine  dependence with nicotine -induced disorder 06/09/2017   Diabetic polyneuropathy associated with type 2 diabetes mellitus (HCC) 06/09/2017   Allergic rhinitis due to pollen 06/09/2017   Mixed hyperlipidemia 06/09/2017   COPD (chronic obstructive pulmonary disease) (HCC) 06/09/2017   Wheezing 06/09/2017   Dysuria 06/09/2017   Essential hypertension 06/09/2017   Personal history of other malignant neoplasm of kidney 06/09/2017   Pain in right hip 06/09/2017   Secondary hyperparathyroidism, not elsewhere classified (HCC) 06/09/2017   Tinea corporis 06/09/2017   PCP:  Laurence Pons, NP Pharmacy:   [facility] - [place], [place] - [address] [place] [address] Phone: [phone] Fax: [phone]     Social Drivers of Health (SDOH) Social History: SDOH Screenings   Food Insecurity: No Food  Insecurity (10/14/2023)  Housing: Low Risk  (10/14/2023)  Transportation Needs: No Transportation Needs (10/14/2023)  Utilities: Not At Risk (10/14/2023)  Alcohol Screen: Low Risk  (03/29/2022)  Depression (PHQ2-9): Low Risk  (09/13/2022)  Financial Resource Strain: Low Risk  (05/12/2021)  Social Connections: Unknown (10/14/2023)  Tobacco Use: Medium Risk (10/14/2023)   SDOH Interventions:     Readmission Risk Interventions    12/01/2021   12:25 PM 08/07/2021    2:36 PM 06/28/2021    2:26 PM  Readmission Risk Prevention Plan  Transportation Screening Complete Complete Complete  Medication Review Oceanographer) Complete Complete Complete  PCP or Specialist appointment within 3-5 days of discharge Complete Complete Complete  HRI or Home Care Consult Complete Complete Complete  SW Recovery Care/Counseling Consult Complete Complete Complete  Palliative Care Screening Complete Complete Not Applicable  Skilled Nursing Facility Complete Complete Not Applicable

## 2023-10-16 NOTE — Progress Notes (Signed)
 Occupational Therapy Treatment Patient Details Name: Elix Catala MRN: [id number] DOB: 1955-10-14 Today's Date: 10/16/2023   History of present illness Pt is a 68 y.o. male presents to ED after being home for 1 day postdischarge 10/12/23 with COPD exacerbation. Pt fell with EMS staff trying to get in ambulance to get here. PMH of CAD, CKD 4, HTN, DM, COPD, OSA on CPAP, HFrEF secondary to NICM (EF 40-45%), atrial flutter s/p ablation (05/2023 ) on Eliquis , history of upper GI bleed (03/2023).   OT comments  Pt is seated in recliner on arrival. Pleasant and agreeable to OT session. He did not mention pain, but endorses new dizziness/blurred vision. Pt reports blurry vision is chronic as well as R hand deficits. Orthostatic vitals taken and were negative, but pt endorses minimal dizziness. Pt performed STS from recliner to RW with CGA/SBA. Ambulated within the room to bathroom and performed toilet transfer with [facility] over toilet with CGA/SBA. LB dressing performed with SBA to doff mesh underwear and don clean pair. Ambulated additional 40 feet using RW into hallway and back with CGA/SBA and cueing for slower pace. Pt returned to bed with all needs in place and will cont to require skilled acute OT services to maximize his safety and IND to return to PLOF. DC recommendation remains appropriate.       If plan is discharge home, recommend the following:  Supervision due to cognitive status;Assistance with cooking/housework;A little help with bathing/dressing/bathroom;A little help with walking and/or transfers   Equipment Recommendations  None recommended by OT    Recommendations for Other Services      Precautions / Restrictions Precautions Precautions: Fall Recall of Precautions/Restrictions: Impaired Restrictions Weight Bearing Restrictions Per Provider Order: No       Mobility Bed Mobility Overal bed mobility: Needs Assistance Bed Mobility: Sit to Supine       Sit to supine:  Supervision   General bed mobility comments: increased time, but able to return to supine without assist    Transfers Overall transfer level: Needs assistance Equipment used: Rolling walker (2 wheels) Transfers: Sit to/from Stand Sit to Stand: Contact guard assist           General transfer comment: CGA for safety d/t reports of dizziness and ~40 feet with RW and no LOB     Balance Overall balance assessment: Needs assistance   Sitting balance-Leahy Scale: Good       Standing balance-Leahy Scale: Fair Standing balance comment: BUE support on RW                           ADL either performed or assessed with clinical judgement   ADL Overall ADL's : Needs assistance/impaired                     Lower Body Dressing: Supervision/safety;Sit to/from stand;Sitting/lateral leans   Toilet Transfer: Supervision/safety;Rolling walker (2 wheels);Regular Toilet;BSC/3in1 Statistician Details (indicate cue type and reason): BSC over toilet                Extremity/Trunk Assessment              Vision       Perception     Praxis     Communication Communication Communication: Impaired Factors Affecting Communication: Hearing impaired   Cognition Arousal: Alert Behavior During Therapy: [facility] for tasks assessed/performed  Following commands: Impaired Following commands impaired: Follows one step commands with increased time      Cueing   Cueing Techniques: Verbal cues, Gestural cues  Exercises Other Exercises Other Exercises: Edu on pacing, sitting to perform tasks when dizzy, ECS, and having nephew close by for all mobility upon return home.    Shoulder Instructions       General Comments 98% sp02 after ambulation    Pertinent Vitals/ Pain       Pain Assessment Pain Assessment: No/denies pain Pain Intervention(s): Monitored during session, Repositioned  Home Living                                           Prior Functioning/Environment              Frequency  Min 2X/week        Progress Toward Goals  OT Goals(current goals can now be found in the care plan section)  Progress towards OT goals: Progressing toward goals  Acute Rehab OT Goals Patient Stated Goal: return home OT Goal Formulation: With patient Time For Goal Achievement: 10/28/23 Potential to Achieve Goals: Good  Plan      Co-evaluation                 AM-PAC OT "6 Clicks" Daily Activity     Outcome Measure   Help from another person eating meals?: None Help from another person taking care of personal grooming?: None Help from another person toileting, which includes using toliet, bedpan, or urinal?: A Little Help from another person bathing (including washing, rinsing, drying)?: A Little Help from another person to put on and taking off regular upper body clothing?: None Help from another person to put on and taking off regular lower body clothing?: None 6 Click Score: 22    End of Session Equipment Utilized During Treatment: Rolling walker (2 wheels);Gait belt  OT Visit Diagnosis: Other abnormalities of gait and mobility (R26.89);Unsteadiness on feet (R26.81)   Activity Tolerance Patient tolerated treatment well   Patient Left in bed;with bed alarm set;with call bell/phone within reach   Nurse Communication Mobility status        Time: 1610-9604 OT Time Calculation (min): 41 min  Charges: OT General Charges $OT Visit: 1 Visit OT Treatments $Self Care/Home Management : 23-37 mins $Therapeutic Activity: 8-22 mins  Terrilyn Tyner, OTR/L  10/16/23, 3:56 PM   Johny Pitstick E Kristyanna Barcelo 10/16/2023, 3:53 PM

## 2023-10-16 NOTE — Plan of Care (Signed)
  Problem: Fluid Volume: Goal: Ability to maintain a balanced intake and output will improve Outcome: Progressing   Problem: Nutritional: Goal: Maintenance of adequate nutrition will improve Outcome: Progressing   Problem: Metabolic: Goal: Ability to maintain appropriate glucose levels will improve Outcome: Progressing   Problem: Coping: Goal: Ability to adjust to condition or change in health will improve Outcome: Progressing

## 2023-10-16 NOTE — Progress Notes (Signed)
 [facility] Progress Note  Patient: Larry Callahan    XLK:[id number]  DOA: 10/14/2023     Date of Service: the patient was seen and examined on 10/16/2023  Chief Complaint  Patient presents with   Chest Congestion   Fall   Brief hospital course: Larry Callahan is a 68 y.o. 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  He presents to [facility] regional ED after being home for 1 day postdischarge 10/12/23 with COPD exacerbation at that time. He called EMS because he has continued to feel short of breath at home and did not feel that he returned to his pre-admission baseline . He notes that he has not been taking all of his medications since discharge and reports he did not know that he needed to pick up any medications, he also does not have access to his nebulizer machine at home. Of note he lives [place] a steep [place] and EMS was unable to drive the ambulance to his front door, while EMTs assisted him to the ambulance he had a fall while walking down the driveway and found to have 2 abrasions: 1 on his knee and 1 on his elbow. No headstrike, he is on Eliquis .   ED Course: On arrival to [facility] regional ED patient was noted to be afebrile temp 36.6 C, BP 170/91, HR 72, RR 18, SpO2 98% on room air.  Labs notable for CO2 14, BUN 122, creatinine 4.17, albumin  3.1, hemoglobin 9.7 which is in line with recent baseline, normal iron  and TIBC and BNP 121.3.  CXR negative.  He was given DuoNebs and 125 mg of methylprednisolone . [facility] contacted for admission.   Assessment and Plan:  # CKD stage IV # Acute on Chronic Metabolic Acidosis Patient presented with shortness of breath, most likely acidotic breathing due to very low CO2 Creatinine 4.17, in line with recent baseline. CO2 14, slightly lower than recent baseline. - Avoid  nephrotoxins, Contrast Dyes, Hypotension and Dehydration  - Renally Adjust Meds S/p 100 mEQ Sodium Bicarb x 2 dose on 5/3 Pt was not on outpatient PO bicarb, so started oral bicarbonate 650 mg p.o. 3 times daily 5/5 increased sodium bicarbonate  1300 mg p.o. 3 times daily due to persistent low bicarb level - Repeat BMP daily  # Dizziness and frequent falls orthostatic hypotension, noticed while standing, blood pressure immediately dropped but recovered after few minutes.  Patient was dizzy with blurry vision. Patient did ambulated with physical therapy, needs rolling walker and home health PT and OT. Since patient was symptomatic so he does not feel comfortable going home today, we will plan to discharge him tomorrow a.m.    # COPD with Acute Exacerbation - PO Prednisone  x 4 days - Continue home Breztri  - Mucinex  - Supplemental O2 as needed - Pulmonary toilet: Mobilize/incentive spirometry/flutter valve     # Type II Diabetes Mellitus - Continue home basal insulin  - AC/HS SSI and CBG monitoring while inpatient   # Chronic systolic CHF - Continue Coreg  and Hydralazine  - Continue to hold Lasix  - Strick I/Os - Daily weights     # Hypertension - Continue Home Coreg  and Hydralazine    # OSA - Continue home CPAP   # Atrial Flutter -Continue home Coreg  and Eliquis   # Fall with Skin Abrasions - Fall Precautions - Bacitracin   # Anemia  of chronic disease, baseline Hb around 9 Iron  profile within normal range Hb 9.7--8.0--8.7--7.8 Monitor H&H  Body mass index is 27.73 kg/m.  Interventions:   Diet: Carb modified diet DVT Prophylaxis: Therapeutic Anticoagulation with Eliquis     Advance goals of care discussion: Full code  Family Communication: family was not present at bedside, at the time of interview.  The pt provided permission to discuss medical plan with the family. Opportunity was given to ask question and all questions were answered satisfactorily. 5/5 d/w  patient's nephew over the phone  Disposition:  Pt is from Home, admitted with metabolic acidosis, dizziness and fall, still has dizziness and remains at high risk for fall and readmission, which precludes a safe discharge. Discharge to home versus SNF, TBD after PT and OT eval, when stable, may need 1-2 more days to improve.  Subjective: No significant events overnight, patient still feels mild shortness of breath, and dizzy while getting up and walking. Patient was positive for orthostatic hypotension while standing and was dizzy with some blurry vision.  But it improved after few minutes. Patient does not feel comfortable going home today, we will plan to discharge him tomorrow a.m.   Physical Exam: General: NAD, lying comfortably Appear in no distress, affect appropriate Eyes: PERRLA ENT: Oral Mucosa Clear, moist  Neck: no JVD,  Cardiovascular: S1 and S2 Present, no Murmur,  Respiratory: good respiratory effort, Bilateral Air entry equal and Decreased, no Crackles, no wheezes Abdomen: Bowel Sound present, Soft and no tenderness,  Skin: no rashes Extremities: no Pedal edema, no calf tenderness Neurologic: without any new focal findings Gait not checked due to patient safety concerns  Vitals:   10/16/23 0408 10/16/23 0413 10/16/23 0500 10/16/23 0800  BP: (!) 145/67   (!) 163/70  Pulse: 64   61  Resp:    16  Temp: (!) 97.4 F (36.3 C) 98.4 F (36.9 C)  99.3 F (37.4 C)  TempSrc: Oral Axillary  Oral  SpO2: 100%   100%  Weight:   90.2 kg   Height:        Intake/Output Summary (Last 24 hours) at 10/16/2023 1508 Last data filed at 10/16/2023 1152 Gross per 24 hour  Intake 480 ml  Output 2050 ml  Net -1570 ml   Filed Weights   10/14/23 0538 10/15/23 0500 10/16/23 0500  Weight: 90 kg 90.3 kg 90.2 kg    Data Reviewed: I have personally reviewed and interpreted daily labs, tele strips, imagings as discussed above. I reviewed all nursing notes, pharmacy notes, vitals,  pertinent old records I have discussed plan of care as described above with RN and patient/family.  CBC: Recent Labs  Lab 10/10/23 1446 10/11/23 0627 10/14/23 0004 10/15/23 0435 10/15/23 1142 10/16/23 0356  WBC 5.9 2.7* 9.2 6.3  --  7.3  HGB 9.7* 8.6* 9.7* 8.0* 8.7* 7.8*  HCT 30.8* 26.3* 30.2* 24.2* 26.5* 24.5*  MCV 91.9 88.9 91.2 89.6  --  90.1  PLT 212 176 246 187  --  200   Basic Metabolic Panel: Recent Labs  Lab 10/12/23 0810 10/14/23 0004 10/14/23 1746 10/15/23 0435 10/16/23 0356  NA 136 134* 132* 138 136  K 3.8 4.9 4.7 4.2 4.1  CL 107 108 104 107 107  CO2 17* 14* 15* 20* 19*  GLUCOSE 193* 71 293* 196* 217*  BUN 110* 122* 119* 122* 125*  CREATININE 4.03* 4.17* 4.17* 4.07* 3.71*  CALCIUM  8.2* 8.0* 7.9* 7.9* 7.4*  MG  --   --   --  1.8 1.9  PHOS  --   --   --  5.8* 4.5    Studies: No results found.  Scheduled Meds:  apixaban   5 mg Oral BID   bacitracin    Topical BID   budeson-glycopyrrolate -formoterol   2 puff Inhalation BID   carvedilol   12.5 mg Oral BID   ferrous sulfate   325 mg Oral QPM   guaiFENesin   600 mg Oral BID   hydrALAZINE   100 mg Oral TID   insulin  aspart  0-15 Units Subcutaneous TID WC   insulin  aspart  0-5 Units Subcutaneous QHS   insulin  glargine-yfgn  20 Units Subcutaneous Daily   And   insulin  glargine-yfgn  16 Units Subcutaneous QHS   loratadine   5 mg Oral Daily   pantoprazole   40 mg Oral Daily   predniSONE   40 mg Oral Q breakfast   rosuvastatin   10 mg Oral QHS   sodium bicarbonate   1,300 mg Oral TID   Continuous Infusions: PRN Meds: acetaminophen  **OR** acetaminophen , ipratropium-albuterol , melatonin, methocarbamol , ondansetron  **OR** ondansetron  (ZOFRAN ) IV, polyethylene glycol  Time spent: 40 minutes  Author: Althia Atlas. MD [facility] 10/16/2023 3:08 PM  To reach On-call, see care teams to locate the attending and reach out to them via [email or web address]. If 7PM-7AM, please contact night-coverage If you still have  difficulty reaching the attending provider, please page the [facility] (Director on Call) for [facility] on amion for assistance.

## 2023-10-16 NOTE — Plan of Care (Signed)
 Problem: Education: Goal: Ability to describe self-care measures that may prevent or decrease complications (Diabetes Survival Skills Education) will improve 10/16/2023 1717 by Eleni Griffin, RN Outcome: Progressing 10/16/2023 1717 by Eleni Griffin, RN Outcome: Progressing Goal: Individualized Educational Video(s) 10/16/2023 1717 by Eleni Griffin, RN Outcome: Progressing 10/16/2023 1717 by Eleni Griffin, RN Outcome: Progressing   Problem: Coping: Goal: Ability to adjust to condition or change in health will improve 10/16/2023 1717 by Eleni Griffin, RN Outcome: Progressing 10/16/2023 1717 by Eleni Griffin, RN Outcome: Progressing   Problem: Fluid Volume: Goal: Ability to maintain a balanced intake and output will improve 10/16/2023 1717 by Eleni Griffin, RN Outcome: Progressing 10/16/2023 1717 by Eleni Griffin, RN Outcome: Progressing   Problem: Health Behavior/Discharge Planning: Goal: Ability to identify and utilize available resources and services will improve 10/16/2023 1717 by Eleni Griffin, RN Outcome: Progressing 10/16/2023 1717 by Eleni Griffin, RN Outcome: Progressing Goal: Ability to manage health-related needs will improve 10/16/2023 1717 by Eleni Griffin, RN Outcome: Progressing 10/16/2023 1717 by Eleni Griffin, RN Outcome: Progressing   Problem: Metabolic: Goal: Ability to maintain appropriate glucose levels will improve 10/16/2023 1717 by Eleni Griffin, RN Outcome: Progressing 10/16/2023 1717 by Eleni Griffin, RN Outcome: Progressing   Problem: Nutritional: Goal: Maintenance of adequate nutrition will improve 10/16/2023 1717 by Eleni Griffin, RN Outcome: Progressing 10/16/2023 1717 by Eleni Griffin, RN Outcome: Progressing Goal: Progress toward achieving an optimal weight will improve 10/16/2023 1717 by Eleni Griffin, RN Outcome: Progressing 10/16/2023 1717 by Eleni Griffin,  RN Outcome: Progressing   Problem: Skin Integrity: Goal: Risk for impaired skin integrity will decrease 10/16/2023 1717 by Eleni Griffin, RN Outcome: Progressing 10/16/2023 1717 by Eleni Griffin, RN Outcome: Progressing   Problem: Tissue Perfusion: Goal: Adequacy of tissue perfusion will improve 10/16/2023 1717 by Eleni Griffin, RN Outcome: Progressing 10/16/2023 1717 by Eleni Griffin, RN Outcome: Progressing   Problem: Education: Goal: Knowledge of General Education information will improve Description: Including pain rating scale, medication(s)/side effects and non-pharmacologic comfort measures 10/16/2023 1717 by Eleni Griffin, RN Outcome: Progressing 10/16/2023 1717 by Eleni Griffin, RN Outcome: Progressing   Problem: Health Behavior/Discharge Planning: Goal: Ability to manage health-related needs will improve 10/16/2023 1717 by Eleni Griffin, RN Outcome: Progressing 10/16/2023 1717 by Eleni Griffin, RN Outcome: Progressing   Problem: Clinical Measurements: Goal: Ability to maintain clinical measurements within normal limits will improve 10/16/2023 1717 by Eleni Griffin, RN Outcome: Progressing 10/16/2023 1717 by Eleni Griffin, RN Outcome: Progressing Goal: Will remain free from infection 10/16/2023 1717 by Eleni Griffin, RN Outcome: Progressing 10/16/2023 1717 by Eleni Griffin, RN Outcome: Progressing Goal: Diagnostic test results will improve 10/16/2023 1717 by Eleni Griffin, RN Outcome: Progressing 10/16/2023 1717 by Eleni Griffin, RN Outcome: Progressing Goal: Respiratory complications will improve 10/16/2023 1717 by Eleni Griffin, RN Outcome: Progressing 10/16/2023 1717 by Eleni Griffin, RN Outcome: Progressing Goal: Cardiovascular complication will be avoided 10/16/2023 1717 by Eleni Griffin, RN Outcome: Progressing 10/16/2023 1717 by Eleni Griffin, RN Outcome: Progressing    Problem: Activity: Goal: Risk for activity intolerance will decrease 10/16/2023 1717 by Eleni Griffin, RN Outcome: Progressing 10/16/2023 1717 by Eleni Griffin, RN Outcome: Progressing   Problem: Nutrition: Goal: Adequate nutrition will be maintained 10/16/2023 1717 by Eleni Griffin, RN Outcome: Progressing 10/16/2023 1717 by Eleni Griffin, RN Outcome: Progressing  Problem: Coping: Goal: Level of anxiety will decrease 10/16/2023 1717 by Eleni Griffin, RN Outcome: Progressing 10/16/2023 1717 by Eleni Griffin, RN Outcome: Progressing   Problem: Elimination: Goal: Will not experience complications related to bowel motility Outcome: Progressing Goal: Will not experience complications related to urinary retention Outcome: Progressing   Problem: Pain Managment: Goal: General experience of comfort will improve and/or be controlled Outcome: Progressing   Problem: Safety: Goal: Ability to remain free from injury will improve Outcome: Progressing   Problem: Skin Integrity: Goal: Risk for impaired skin integrity will decrease Outcome: Progressing   Problem: Education: Goal: Knowledge of disease or condition will improve Outcome: Progressing Goal: Knowledge of the prescribed therapeutic regimen will improve Outcome: Progressing Goal: Individualized Educational Video(s) Outcome: Progressing   Problem: Activity: Goal: Ability to tolerate increased activity will improve Outcome: Progressing Goal: Will verbalize the importance of balancing activity with adequate rest periods Outcome: Progressing   Problem: Respiratory: Goal: Ability to maintain a clear airway will improve Outcome: Progressing Goal: Levels of oxygenation will improve Outcome: Progressing Goal: Ability to maintain adequate ventilation will improve Outcome: Progressing

## 2023-10-16 NOTE — Progress Notes (Addendum)
 Mobility Specialist - Progress Note   10/16/23 1100  Orthostatic Lying   BP- Lying 150/80  Pulse- Lying 63  Orthostatic Sitting  BP- Sitting 168/78  Pulse- Sitting 62  Orthostatic Standing at 0 minutes  BP- Standing at 0 minutes 137/81  Pulse- Standing at 0 minutes 73  Orthostatic Standing at 3 minutes  BP- Standing at 3 minutes (!) 156/91  Pulse- Standing at 3 minutes 70  Mobility  Activity Ambulated with assistance in hallway;Stood at bedside;Dangled on edge of bed  Level of Assistance Contact guard assist, steadying assist  Assistive Device Front wheel walker  Distance Ambulated (ft) 35 ft  Activity Response Tolerated well  Mobility visit 1 Mobility     Pt lying in bed upon arrival, utilizing RA. Pt agreeable to activity. Orthostatics recorded above. Pt completed bed mobility with modA. Dizzy upon sitting that is persistent throughout session. Denied double vision, but did state his vision is blurry. STS with minA +2 for safety. Pt very unsteady upon standing requiring assist for balance. Limited RW grip in RUE. Pt voiced pain and burning in R knee. Agreeable to attempt ambulation with chair follow. Ambulated in hallway with CGA. Voiced SOB with activity, but O2 maintained > 90%. Pt left in chair with needs in reach. RN notified.    Searcy Czech Mobility Specialist 10/16/23, 11:57 AM

## 2023-10-17 ENCOUNTER — Ambulatory Visit: Admitting: Nurse Practitioner

## 2023-10-17 ENCOUNTER — Other Ambulatory Visit: Payer: Self-pay

## 2023-10-17 DIAGNOSIS — E872 Acidosis, unspecified: Secondary | ICD-10-CM | POA: Diagnosis not present

## 2023-10-17 LAB — CBC
HCT: 24.1 % — ABNORMAL LOW (ref 39.0–52.0)
Hemoglobin: 7.7 g/dL — ABNORMAL LOW (ref 13.0–17.0)
MCH: 28.9 pg (ref 26.0–34.0)
MCHC: 32 g/dL (ref 30.0–36.0)
MCV: 90.6 fL (ref 80.0–100.0)
Platelets: 205 10*3/uL (ref 150–400)
RBC: 2.66 MIL/uL — ABNORMAL LOW (ref 4.22–5.81)
RDW: 15 % (ref 11.5–15.5)
WBC: 9 10*3/uL (ref 4.0–10.5)
nRBC: 0 % (ref 0.0–0.2)

## 2023-10-17 LAB — GLUCOSE, CAPILLARY
Glucose-Capillary: 101 mg/dL — ABNORMAL HIGH (ref 70–99)
Glucose-Capillary: 34 mg/dL — CL (ref 70–99)
Glucose-Capillary: 61 mg/dL — ABNORMAL LOW (ref 70–99)
Glucose-Capillary: 186 mg/dL — ABNORMAL HIGH (ref 70–99)

## 2023-10-17 LAB — BASIC METABOLIC PANEL WITH GFR
Anion gap: 8 (ref 5–15)
BUN: 111 mg/dL — ABNORMAL HIGH (ref 8–23)
CO2: 21 mmol/L — ABNORMAL LOW (ref 22–32)
Calcium: 7.7 mg/dL — ABNORMAL LOW (ref 8.9–10.3)
Chloride: 107 mmol/L (ref 98–111)
Creatinine, Ser: 3.25 mg/dL — ABNORMAL HIGH (ref 0.61–1.24)
GFR, Estimated: 20 mL/min — ABNORMAL LOW (ref >60)
Glucose, Bld: 82 mg/dL (ref 70–99)
Potassium: 3.9 mmol/L (ref 3.5–5.1)
Sodium: 136 mmol/L (ref 135–145)

## 2023-10-17 LAB — PHOSPHORUS: Phosphorus: 3.3 mg/dL (ref 2.5–4.6)

## 2023-10-17 LAB — MAGNESIUM: Magnesium: 1.9 mg/dL (ref 1.7–2.4)

## 2023-10-17 MED ORDER — POLYETHYLENE GLYCOL 3350 17 G PO PACK: 17.0000 g | PACK | Freq: Two times a day (BID) | ORAL | Status: DC

## 2023-10-17 MED ORDER — HYDRALAZINE HCL 100 MG PO TABS
100.0000 mg | ORAL_TABLET | Freq: Three times a day (TID) | ORAL | 1 refills | Status: DC | PRN
  Filled 2023-10-17: qty 100, 34d supply, fill #0

## 2023-10-17 MED ORDER — BISACODYL 5 MG PO TBEC: 10.0000 mg | DELAYED_RELEASE_TABLET | Freq: Every day | ORAL | Status: DC

## 2023-10-17 MED ORDER — BISACODYL 5 MG PO TBEC: 10.0000 mg | DELAYED_RELEASE_TABLET | Freq: Once | ORAL | Status: DC

## 2023-10-17 MED ORDER — SODIUM BICARBONATE 650 MG PO TABS
ORAL_TABLET | ORAL | 0 refills | Status: DC
  Filled 2023-10-17: qty 354, 104d supply, fill #0

## 2023-10-17 MED ORDER — BISACODYL 10 MG RE SUPP: 10.0000 mg | Freq: Every day | RECTAL | Status: DC | PRN

## 2023-10-17 NOTE — Inpatient Diabetes Management (Signed)
 Inpatient Diabetes Program Recommendations  AACE/ADA: New Consensus Statement on Inpatient Glycemic Control   Target Ranges:  Prepandial:   less than 140 mg/dL      Peak postprandial:   less than 180 mg/dL (1-2 hours)      Critically ill patients:  140 - 180 mg/dL    Latest Reference Range & Units 10/16/23 07:57 10/16/23 11:45 10/16/23 17:00 10/16/23 21:53 10/17/23 08:31 10/17/23 08:50  Glucose-Capillary 70 - 99 mg/dL 161 (H) 096 (H) 045 (H) 245 (H) 34 (LL) 61 (L)    Review of Glycemic Control  Diabetes history: DM2 Outpatient Diabetes medications: Toujeo  20 units QAM, Toujeo  16 units at bedtime, Humalog  0-10 units TID with meals Current orders for Inpatient glycemic control: Semglee  20 units QAM, Semgelee 16 units at bedtime, Novolog  0-15 units TID with meals, Novolog  0-5 units QHS  Inpatient Diabetes Program Recommendations:    Insulin : Noted patient received Prednisone  40 mg last at 8:41 am today. Steroids no longer ordered. CBG 34 mg/dl at 4:09 am today. Patient has already received Semglee  20 units this morning at 8:42 am. Please discontinue Semglee  16 units at bedtime.   Thanks, Beacher Limerick, RN, MSN, CDCES Diabetes Coordinator Inpatient Diabetes Program [phone] (Team Pager from 8am to 5pm)

## 2023-10-17 NOTE — Progress Notes (Signed)
 Occupational Therapy Treatment Patient Details Name: Larry Callahan MRN: [id number] DOB: 1955-11-26 Today's Date: 10/17/2023   History of present illness Pt is a 68 y.o. male presents to ED after being home for 1 day postdischarge 10/12/23 with COPD exacerbation. Pt fell with EMS staff trying to get in ambulance to get here. PMH of CAD, CKD 4, HTN, DM, COPD, OSA on CPAP, HFrEF secondary to NICM (EF 40-45%), atrial flutter s/p ablation (05/2023 ) on Eliquis , history of upper GI bleed (03/2023).   OT comments  Pt seen for OT treatment on this date. Upon arrival to room pt long sitting in bed, agreeable to tx with min encouragement. Pt completed bed<>recliner transfer, with CGA, declined DME use due to short distance, used bed rails and recliner arm rests for UE support. Pt denied further ADL/functional mobility on this date due to stated discharging soon, despite verbal encouragement. Pt eager to drink his coffee in the recliner. Pt used the urinal while seated in recliner prior to the end of session, no physical assistance required; supervision. All needs within reach, communicated with RN pt mobility status. Pt making good progress toward goals, will continue to follow POC. Discharge recommendation remains appropriate.        If plan is discharge home, recommend the following:  Supervision due to cognitive status;Assistance with cooking/housework;A little help with bathing/dressing/bathroom;A little help with walking and/or transfers   Equipment Recommendations  None recommended by OT    Recommendations for Other Services      Precautions / Restrictions Precautions Precautions: Fall Recall of Precautions/Restrictions: Impaired Restrictions Weight Bearing Restrictions Per Provider Order: No       Mobility Bed Mobility Overal bed mobility: Needs Assistance Bed Mobility: Supine to Sit     Supine to sit: Contact guard, Used rails Sit to supine: Supervision   General bed mobility  comments: Increased time, verbal cues for handplacement and redirection for task attention    Transfers Overall transfer level: Needs assistance   Transfers: Sit to/from Stand Sit to Stand: Contact guard assist           General transfer comment: CGA for safety throughout t/f, pt declined use of DME for short distance to recliner     Balance Overall balance assessment: Needs assistance Sitting-balance support: Feet supported, Single extremity supported Sitting balance-Leahy Scale: Good     Standing balance support: Single extremity supported, During functional activity Standing balance-Leahy Scale: Fair Standing balance comment: Single UE support on bed rails                           ADL either performed or assessed with clinical judgement   ADL Overall ADL's : Needs assistance/impaired Eating/Feeding: Modified independent;Sitting;Cueing for safety                       Toilet Transfer: Contact guard assist;Ambulation;Grab bars (Simulated toilet t/f)   Toileting- Clothing Manipulation and Hygiene: Supervision/safety       Functional mobility during ADLs: Contact guard assist;Rolling walker (2 wheels) General ADL Comments: Urinal use while seated in recliner, supervision. Toilet t/f from EOB to recliner with no DME use of rails    Communication Communication Communication: Impaired Factors Affecting Communication: Hearing impaired   Cognition Arousal: Alert Behavior During Therapy: WFL for tasks assessed/performed Cognition: No family/caregiver present to determine baseline             OT - Cognition Comments: hyperverbal with need  for redirection to focus on tasks                 Following commands: Impaired Following commands impaired: Follows one step commands with increased time      Cueing   Cueing Techniques: Verbal cues, Gestural cues  Exercises Exercises: Other exercises Other Exercises Other Exercises: [id number]  of sitting up in recliner, safe transfer sequencing, assistance with technology    Shoulder Instructions       General Comments Assisted pt with phone call to family member during session to coordinate d/c ride. On RA throughout session.    Pertinent Vitals/ Pain       Pain Assessment Pain Assessment: No/denies pain  Home Living                                          Prior Functioning/Environment              Frequency  Min 2X/week        Progress Toward Goals  OT Goals(current goals can now be found in the care plan section)  Progress towards OT goals: Progressing toward goals  Acute Rehab OT Goals Patient Stated Goal: return home OT Goal Formulation: With patient Time For Goal Achievement: 10/28/23 Potential to Achieve Goals: Good ADL Goals Pt Will Transfer to Toilet: with modified independence;ambulating Pt Will Perform Toileting - Clothing Manipulation and hygiene: with modified independence;sitting/lateral leans;sit to/from stand Additional ADL Goal #1: Pt will demo implementation of 1 learned ECS during ADL performance 2/2 trials to pervent overexertion and maximize safety.  Plan      Co-evaluation                 AM-PAC OT "6 Clicks" Daily Activity     Outcome Measure   Help from another person eating meals?: None Help from another person taking care of personal grooming?: None Help from another person toileting, which includes using toliet, bedpan, or urinal?: A Little Help from another person bathing (including washing, rinsing, drying)?: A Little Help from another person to put on and taking off regular upper body clothing?: None Help from another person to put on and taking off regular lower body clothing?: None 6 Click Score: 22    End of Session Equipment Utilized During Treatment: Gait belt  OT Visit Diagnosis: Other abnormalities of gait and mobility (R26.89);Unsteadiness on feet (R26.81)   Activity Tolerance  Patient tolerated treatment well   Patient Left in chair;with call bell/phone within reach;with chair alarm set   Nurse Communication Mobility status        Time: 0865-7846 OT Time Calculation (min): 19 min  Charges: OT General Charges $OT Visit: 1 Visit OT Treatments $Self Care/Home Management : 8-22 mins  Rosaria Common M.S. OTR/L  10/17/23, 1:16 PM

## 2023-10-17 NOTE — Discharge Summary (Signed)
 [facility] Discharge Summary   Patient: Larry Callahan WUJ:[id number]  PCP: Larry Pons, NP  Date of admission: 10/14/2023   Date of discharge: 10/17/2023     Discharge Diagnoses:  Active Problems:   COPD with acute exacerbation (HCC)   Acute on chronic systolic CHF (congestive heart failure) (HCC)   Acute on chronic anemia   Obstructive sleep apnea, adult   Gastroesophageal reflux disease without esophagitis   Essential hypertension   Type 2 diabetes mellitus (HCC)   CKD (chronic kidney disease) stage 4, GFR 15-29 ml/min (HCC)   HLD (hyperlipidemia)   Atrial flutter (HCC)   COPD exacerbation (HCC)   Fall at home, initial encounter   Admitted From: Home Disposition:  Home with [facility]  Recommendations for Outpatient Follow-up:  Follow-up with PCP in 1 week,  Follow-up with nephrology for CKD stage IV management as an outpatient. Follow up LABS/TEST:  CBC and BMP in 1 week   Follow-up Information     Callahan, Alyssa, NP Follow up in 1 week(s).   Specialty: Nurse Practitioner Why: May 19th, 2025 @ 11:20 Please call office if you are not able to make your appointment. Contact information: [address] [place] [address] [phone]         Larry Chalet, MD Follow up in 1 week(s).   Specialty: Nephrology Why: CKD 4 with acidosis Oct 31, 2023 at 11:20 am Please call office if you are not able to make your appointment. Contact information: [address] [place] [address] [phone]                Diet recommendation: Cardiac and Carb modified diet  Activity: The patient is advised to gradually reintroduce usual activities, as tolerated  Discharge Condition: stable  Code Status: Full code   History of present illness: As per the H and P dictated on admission Hospital Course:  Larry Callahan is a 68 y.o. male with medical history significant of  CAD, CKD 4, HTN, DM, COPD, OSA on CPAP, HFrEF secondary  to NICM (EF 40-45%), atrial flutter s/p ablation (05/2023 ) on Eliquis , history of upper GI bleed (03/2023).  He presents to [facility] ED after being home for 1 day postdischarge 10/12/23 with COPD exacerbation at that time. He called EMS because he has continued to feel short of breath at home and did not feel that he returned to his pre-admission baseline . He notes that he has not been taking all of his medications since discharge and reports he did not know that he needed to pick up any medications, he also does not have access to his nebulizer machine at home. Of note he lives up a steep [place] and EMS was unable to drive the ambulance to his front door, while EMTs assisted him to the ambulance he had a fall while walking down the driveway and found to have 2 abrasions: 1 on his knee and 1 on his elbow. No headstrike, he is on Eliquis .   ED Course: On arrival to [facility] ED patient was noted to be afebrile temp 36.6 C, BP 170/91, HR 72, RR 18, SpO2 98% on room air.  Labs notable for CO2 14, BUN 122, creatinine 4.17, albumin  3.1, hemoglobin 9.7 which is in line with recent baseline, normal iron  and TIBC and BNP 121.3.  CXR negative.  He was given DuoNebs and 125 mg of methylprednisolone . [facility] contacted for admission.     Assessment and Plan:   # CKD stage  IV # Acute on Chronic Metabolic Acidosis Patient presented with shortness of breath, most likely acidotic breathing due to very low CO2.  S/p 100 mEQ Sodium Bicarb x 2 dose on 5/3 Pt was not on outpatient PO bicarb, so started oral bicarbonate 650 mg p.o. 3 times daily 5/5 increased sodium bicarbonate  1300 mg p.o. 3 times daily due to persistent low bicarb level.  5/6 stable today discharged on oral bicarbonate.  Repeat BMP after 1 week and follow with PCP.  Follow-up with nephrology in 1 week   # Dizziness and frequent falls On 5/5 orthostatic hypotension, noticed while standing, blood pressure immediately dropped but recovered  after few minutes.  Patient was dizzy with blurry vision. Patient did ambulated with physical therapy, needs rolling walker and home health PT and OT. Since patient was symptomatic so he does not feel comfortable going home today, we will plan to discharge him tomorrow a.m. 5/6 patient had an episode of hypoglycemia which resolved and patient feels fine, denies any dizziness.  Agreed to go home.   # COPD with Acute Exacerbation: s/p PO Prednisone  x 4 days Continue home Breztri , s/p Mucinex , Supplemental O2 prn  # Type II Diabetes Mellitus: Continue home basal insulin , monitor CBG at home and f/u with pcp # Chronic systolic CHF: Continue Coreg  and Hydralazine , resumed Lasix  and kdur 20 pod.    # Hypertension: Continue Home Coreg  and Hydralazine  prn # OSA: Continue home CPAP # Atrial Flutter: Continue home Coreg  and Eliquis  # Fall with Skin Abrasions: continue Fall Precautions and may continue Bacitracin  # Anemia of chronic disease, baseline Hb around 9 Iron  profile within normal range. Hb 9.7--8.0--8.7--7.7 low, no active bleeding. F/u with pcp to repeat cbc in 1 wk  Body mass index is 28.38 kg/m.  Nutrition Interventions:  - Patient was instructed, not to drive, operate heavy machinery, perform activities at heights, swimming or participation in water activities or provide baby sitting services while on Pain, Sleep and Anxiety Medications; until his outpatient Physician has advised to do so again.  - Also recommended to not to take more than prescribed Pain, Sleep and Anxiety Medications.  Patient was seen by physical therapy, who recommended Home health, which was arranged. On the day of the discharge the patient's vitals were stable, and no other acute medical condition were reported by patient. the patient was felt safe to be discharge at Home with Home health.  Consultants: None Procedures: None  Discharge Exam: General: Appear in no distress, no Rash; Oral Mucosa Clear,  moist. Cardiovascular: S1 and S2 Present, no Murmur, Respiratory: normal respiratory effort, Bilateral Air entry present and no Crackles, no wheezes Abdomen: Bowel Sound present, Soft and no tenderness, no hernia Extremities: no Pedal edema, no calf tenderness Neurology: alert and oriented to time, place, and person affect appropriate.  Filed Weights   10/15/23 0500 10/16/23 0500 10/17/23 0358  Weight: 90.3 kg 90.2 kg 92.3 kg   Vitals:   10/17/23 0259 10/17/23 0840  BP: (!) 144/65 (!) 170/70  Pulse: 65 (!) 55  Resp: 17 17  Temp:    SpO2: 100% 100%    DISCHARGE MEDICATION: Allergies as of 10/17/2023       Reactions   Penicillins Rash   Codeine  Nausea And Vomiting        Medication List     STOP taking these medications    atorvastatin  10 MG tablet Commonly known as: LIPITOR   iron  polysaccharides 150 MG capsule Commonly known as: NIFEREX  omeprazole  40 MG capsule Commonly known as: PRILOSEC       TAKE these medications    Accu-Chek Guide test strip Generic drug: glucose blood Use to check blood sugars 4 times a day E11.65   Accu-Chek Softclix Lancets lancets Use to check blood sugars 4 times a day   E11.65   acetaminophen  325 MG tablet Commonly known as: TYLENOL  Take 2 tablets (650 mg total) by mouth every 6 (six) hours as needed for mild pain or moderate pain (or Fever >/= 101).   albuterol  108 (90 Base) MCG/ACT inhaler Commonly known as: VENTOLIN  HFA Inhale 1-2 puffs into the lungs every 4 (four) hours as needed for wheezing or shortness of breath.   amLODipine  5 MG tablet Commonly known as: NORVASC  Take 1 tablet (5 mg total) by mouth 2 (two) times daily. Skip the dose if systolic BP less than 140 mmHg What changed: additional instructions   apixaban  5 MG Tabs tablet Commonly known as: Eliquis  Take 1 tablet (5 mg total) by mouth 2 (two) times daily.   Banophen 25 MG capsule Generic drug: diphenhydrAMINE  Take 1 capsule (25 mg total) by mouth  every 8 (eight) hours as needed for itching.   Breztri  Aerosphere 160-9-4.8 MCG/ACT Aero inhaler Generic drug: budeson-glycopyrrolate -formoterol  Inhale 2 puffs into the lungs 2 (two) times daily.   carvedilol  12.5 MG tablet Commonly known as: COREG  Take 1 tablet (12.5 mg total) by mouth 2 (two) times daily.   cyanocobalamin  1000 MCG tablet Take 1 tablet (1,000 mcg total) by mouth daily.   ferrous sulfate  325 (65 FE) MG tablet Take 325 mg by mouth every evening.   furosemide  40 MG tablet Commonly known as: LASIX  Take 1 tablet (40 mg total) by mouth daily.   guaiFENesin  600 MG 12 hr tablet Commonly known as: MUCINEX  Take 2 tablets (1,200 mg total) by mouth 2 (two) times daily for 7 days.   hydrALAZINE  100 MG tablet Commonly known as: APRESOLINE  Take 1 tablet (100 mg total) by mouth 3 (three) times daily as needed (SBP >160).   Insulin  lispro 100 UNIT/ML Commonly known as: HUMALOG  Junior KwikPen Inject 0-10 Units into the skin 3 (three) times daily.   ipratropium-albuterol  0.5-2.5 (3) MG/3ML Soln Commonly known as: DUONEB Take 3 mLs by nebulization every 6 (six) hours as needed.   loratadine  10 MG tablet Commonly known as: CLARITIN  Take 1 tablet (10 mg total) by mouth daily.   methocarbamol  750 MG tablet Commonly known as: ROBAXIN  Take 1 tablet (750 mg total) by mouth every 6 (six) hours as needed for muscle spasms.   Novofine Pen Needle 32G X 6 MM Misc Generic drug: Insulin  Pen Needle Use as directed with toujeo  pen   pantoprazole  40 MG tablet Commonly known as: PROTONIX  Take 40 mg by mouth 2 (two) times daily.   PERIGUARD EX Apply 1 Application topically in the morning, at noon, and at bedtime.   polyethylene glycol powder 17 GM/SCOOP powder Commonly known as: GLYCOLAX /MIRALAX  Take 17 g by mouth daily.   potassium chloride  SA 20 MEQ tablet Commonly known as: KLOR-CON  M Take 1 tablet (20 mEq total) by mouth daily.   rosuvastatin  10 MG tablet Commonly  known as: CRESTOR  Take 1 tablet (10 mg total) by mouth at bedtime.   sodium bicarbonate  650 MG tablet Take 2 tablets (1,300 mg total) by mouth 3 (three) times daily for 14 days, THEN 1 tablet (650 mg total) 3 (three) times daily. Start taking on: Oct 17, 2023   Toujeo   Max SoloStar 300 UNIT/ML Solostar Pen Generic drug: insulin  glargine (2 Unit Dial ) Inject 16 Units into the skin at bedtime. Inject 20 units in am   True Metrix Air Glucose Meter Devi 1 Device by Does not apply route 2 (two) times daily.       Allergies  Allergen Reactions   Penicillins Rash   Codeine  Nausea And Vomiting   Discharge Instructions     Call MD for:  difficulty breathing, headache or visual disturbances   Complete by: As directed    Call MD for:  extreme fatigue   Complete by: As directed    Call MD for:  persistant dizziness or light-headedness   Complete by: As directed    Call MD for:  persistant nausea and vomiting   Complete by: As directed    Call MD for:  severe uncontrolled pain   Complete by: As directed    Call MD for:  temperature >100.4   Complete by: As directed    Diet - low sodium heart healthy   Complete by: As directed    Discharge instructions   Complete by: As directed    Follow-up with PCP in 1 week, CBC and BMP in 1 week Follow-up with nephrology for CKD stage IV management as an outpatient.   Increase activity slowly   Complete by: As directed        The results of significant diagnostics from this hospitalization (including imaging, microbiology, ancillary and laboratory) are listed below for reference.    Significant Diagnostic Studies: DG Chest 1 View Result Date: 10/14/2023 CLINICAL DATA:  Recent fall with chest pain, initial encounter EXAM: PORTABLE CHEST 1 VIEW COMPARISON:  10/10/2023 FINDINGS: Cardiac shadow is mildly prominent but stable. Aortic calcifications are seen. Lungs are well aerated bilaterally. No acute bony abnormality is seen. IMPRESSION: No acute  abnormality noted. Electronically Signed   By: Violeta Grey M.D.   On: 10/14/2023 02:35   DG Knee Complete 4 Views Left Result Date: 10/14/2023 CLINICAL DATA:  Recent fall with left knee pain, initial encounter EXAM: LEFT KNEE - COMPLETE 4+ VIEW COMPARISON:  None Available. FINDINGS: Degenerative changes are noted most prominent in the lateral joint space. No joint effusion is seen. No acute fracture or dislocation is noted. IMPRESSION: Degenerative change without acute abnormality. Electronically Signed   By: Violeta Grey M.D.   On: 10/14/2023 02:34   DG Elbow Complete Left Result Date: 10/14/2023 CLINICAL DATA:  Recent fall with elbow pain, initial encounter EXAM: LEFT ELBOW - COMPLETE 3+ VIEW COMPARISON:  None Available. FINDINGS: Postsurgical changes are noted in the midshaft of the radius and ulna. No joint effusion is seen. Degenerative changes about the elbow joint are noted. Some radiopaque densities are seen in the soft tissues posteriorly consistent with small foreign bodies. IMPRESSION: Degenerative change without acute bony abnormality. Soft tissue foreign bodies along the posterior aspect of the proximal forearm. Electronically Signed   By: Violeta Grey M.D.   On: 10/14/2023 02:33   CT HEAD WO CONTRAST Result Date: 10/14/2023 CLINICAL DATA:  Head trauma, minor (Age >= 65y) EXAM: CT HEAD WITHOUT CONTRAST TECHNIQUE: Contiguous axial images were obtained from the base of the skull through the vertex without intravenous contrast. RADIATION DOSE REDUCTION: This exam was performed according to the departmental dose-optimization program which includes automated exposure control, adjustment of the mA and/or kV according to patient size and/or use of iterative reconstruction technique. COMPARISON:  CT head April 1, 24. FINDINGS: Brain: No evidence of  acute infarction, hemorrhage, hydrocephalus, extra-axial collection or mass lesion/mass effect. Vascular: No hyperdense vessel.  Calcific atherosclerosis.  Skull: No acute fracture. Sinuses/Orbits: Clear sinuses.  No acute orbital findings. Other: No mastoid effusions. IMPRESSION: No evidence of acute intracranial abnormality. Electronically Signed   By: Stevenson Elbe M.D.   On: 10/14/2023 02:07   NM Pulmonary Perfusion Result Date: 10/11/2023 CLINICAL DATA:  Chest pain with inspiration. EXAM: NUCLEAR MEDICINE PERFUSION LUNG SCAN TECHNIQUE: Perfusion images were obtained in multiple projections after intravenous injection of radiopharmaceutical. Ventilation scans intentionally deferred if perfusion scan and chest x-ray adequate for interpretation during COVID 19 epidemic. RADIOPHARMACEUTICALS:  4.3 mCi Tc-38m MAA IV COMPARISON:  09/23/2022 FINDINGS: No focal segmental or larger peripheral wedge-shaped filling defect in either lung. IMPRESSION: Negative for pulmonary embolism. Electronically Signed   By: Donnal Fusi M.D.   On: 10/11/2023 09:43   DG Chest 2 View Result Date: 10/10/2023 CLINICAL DATA:  Chest pain with inspiration EXAM: CHEST - 2 VIEW COMPARISON:  01/09/2023 FINDINGS: Frontal and lateral views of the chest demonstrate an unremarkable cardiac silhouette. No acute airspace disease, effusion, or pneumothorax. No acute bony abnormalities. IMPRESSION: 1. No acute intrathoracic process. Electronically Signed   By: Bobbye Burrow M.D.   On: 10/10/2023 15:44    Microbiology: Recent Results (from the past 240 hours)  Resp panel by RT-PCR (RSV, Flu A&B, Covid) Anterior Nasal Swab     Status: None   Collection Time: 10/10/23  4:30 PM   Specimen: Anterior Nasal Swab  Result Value Ref Range Status   SARS Coronavirus 2 by RT PCR NEGATIVE NEGATIVE Final    Comment: (NOTE) SARS-CoV-2 target nucleic acids are NOT DETECTED.  The SARS-CoV-2 RNA is generally detectable in upper respiratory specimens during the acute phase of infection. The lowest concentration of SARS-CoV-2 viral copies this assay can detect is 138 copies/mL. A negative result does  not preclude SARS-Cov-2 infection and should not be used as the sole basis for treatment or other patient management decisions. A negative result may occur with  improper specimen collection/handling, submission of specimen other than nasopharyngeal swab, presence of viral mutation(s) within the areas targeted by this assay, and inadequate number of viral copies(<138 copies/mL). A negative result must be combined with clinical observations, patient history, and epidemiological information. The expected result is Negative.  Fact Sheet for Patients:  BloggerCourse.com  Fact Sheet for Healthcare Providers:  SeriousBroker.it  This test is no t yet approved or cleared by the [place]  FDA and  has been authorized for detection and/or diagnosis of SARS-CoV-2 by FDA under an Emergency Use Authorization (EUA). This EUA will remain  in effect (meaning this test can be used) for the duration of the COVID-19 declaration under Section 564(b)(1) of the Act, 21 U.S.C.section 360bbb-3(b)(1), unless the authorization is terminated  or revoked sooner.       Influenza A by PCR NEGATIVE NEGATIVE Final   Influenza B by PCR NEGATIVE NEGATIVE Final    Comment: (NOTE) The Xpert Xpress SARS-CoV-2/FLU/RSV plus assay is intended as an aid in the diagnosis of influenza from Nasopharyngeal swab specimens and should not be used as a sole basis for treatment. Nasal washings and aspirates are unacceptable for Xpert Xpress SARS-CoV-2/FLU/RSV testing.  Fact Sheet for Patients: BloggerCourse.com  Fact Sheet for Healthcare Providers: SeriousBroker.it  This test is not yet approved or cleared by the [place]  FDA and has been authorized for detection and/or diagnosis of SARS-CoV-2 by FDA under an Emergency Use Authorization (EUA). This  EUA will remain in effect (meaning this test can be used) for the  duration of the COVID-19 declaration under Section 564(b)(1) of the Act, 21 U.S.C. section 360bbb-3(b)(1), unless the authorization is terminated or revoked.     Resp Syncytial Virus by PCR NEGATIVE NEGATIVE Final    Comment: (NOTE) Fact Sheet for Patients: BloggerCourse.com  Fact Sheet for Healthcare Providers: SeriousBroker.it  This test is not yet approved or cleared by the [place]  FDA and has been authorized for detection and/or diagnosis of SARS-CoV-2 by FDA under an Emergency Use Authorization (EUA). This EUA will remain in effect (meaning this test can be used) for the duration of the COVID-19 declaration under Section 564(b)(1) of the Act, 21 U.S.C. section 360bbb-3(b)(1), unless the authorization is terminated or revoked.  Performed at [facility], [address] [place], [place] [address]      Labs: CBC: Recent Labs  Lab 10/11/23 [phone] 10/14/23 0004 10/15/23 0435 10/15/23 1142 10/16/23 0356 10/17/23 0418  WBC 2.7* 9.2 6.3  --  7.3 9.0  HGB 8.6* 9.7* 8.0* 8.7* 7.8* 7.7*  HCT 26.3* 30.2* 24.2* 26.5* 24.5* 24.1*  MCV 88.9 91.2 89.6  --  90.1 90.6  PLT 176 246 187  --  200 205   Basic Metabolic Panel: Recent Labs  Lab 10/14/23 0004 10/14/23 1746 10/15/23 0435 10/16/23 0356 10/17/23 0418  NA 134* 132* 138 136 136  K 4.9 4.7 4.2 4.1 3.9  CL 108 104 107 107 107  CO2 14* 15* 20* 19* 21*  GLUCOSE 71 293* 196* 217* 82  BUN 122* 119* 122* 125* 111*  CREATININE 4.17* 4.17* 4.07* 3.71* 3.25*  CALCIUM  8.0* 7.9* 7.9* 7.4* 7.7*  MG  --   --  1.8 1.9 1.9  PHOS  --   --  5.8* 4.5 3.3   Liver Function Tests: Recent Labs  Lab 10/14/23 0004  AST 16  ALT 13  ALKPHOS 54  BILITOT 0.7  PROT 6.9  ALBUMIN  3.1*   No results for input(s): "LIPASE", "AMYLASE" in the last 168 hours. No results for input(s): "AMMONIA" in the last 168 hours. Cardiac Enzymes: Recent Labs  Lab 10/15/23 0435   CKTOTAL 34*   BNP (last 3 results) Recent Labs    10/10/23 1446 10/14/23 0004  BNP 104.0* 121.3*   CBG: Recent Labs  Lab 10/16/23 2153 10/17/23 0831 10/17/23 0850 10/17/23 0926 10/17/23 1143  GLUCAP 245* 34* 61* 101* 186*    Time spent: 35 minutes  Signed:  Althia Atlas  [facility] 10/17/2023 11:54 AM

## 2023-10-17 NOTE — Plan of Care (Signed)

## 2023-10-17 NOTE — Plan of Care (Signed)
   Problem: Education: Goal: Ability to describe self-care measures that may prevent or decrease complications (Diabetes Survival Skills Education) will improve Outcome: Adequate for Discharge Goal: Individualized Educational Video(s) Outcome: Adequate for Discharge   Problem: Coping: Goal: Ability to adjust to condition or change in health will improve Outcome: Adequate for Discharge   Problem: Fluid Volume: Goal: Ability to maintain a balanced intake and output will improve Outcome: Adequate for Discharge   Problem: Health Behavior/Discharge Planning: Goal: Ability to identify and utilize available resources and services will improve Outcome: Adequate for Discharge Goal: Ability to manage health-related needs will improve Outcome: Adequate for Discharge   Problem: Metabolic: Goal: Ability to maintain appropriate glucose levels will improve Outcome: Adequate for Discharge   Problem: Nutritional: Goal: Maintenance of adequate nutrition will improve Outcome: Adequate for Discharge Goal: Progress toward achieving an optimal weight will improve Outcome: Adequate for Discharge   Problem: Skin Integrity: Goal: Risk for impaired skin integrity will decrease Outcome: Adequate for Discharge   Problem: Tissue Perfusion: Goal: Adequacy of tissue perfusion will improve Outcome: Adequate for Discharge   Problem: Education: Goal: Knowledge of General Education information will improve Description: Including pain rating scale, medication(s)/side effects and non-pharmacologic comfort measures Outcome: Adequate for Discharge   Problem: Health Behavior/Discharge Planning: Goal: Ability to manage health-related needs will improve Outcome: Adequate for Discharge   Problem: Clinical Measurements: Goal: Ability to maintain clinical measurements within normal limits will improve Outcome: Adequate for Discharge Goal: Will remain free from infection Outcome: Adequate for Discharge Goal:  Diagnostic test results will improve Outcome: Adequate for Discharge Goal: Respiratory complications will improve Outcome: Adequate for Discharge Goal: Cardiovascular complication will be avoided Outcome: Adequate for Discharge   Problem: Activity: Goal: Risk for activity intolerance will decrease Outcome: Adequate for Discharge   Problem: Nutrition: Goal: Adequate nutrition will be maintained Outcome: Adequate for Discharge   Problem: Coping: Goal: Level of anxiety will decrease Outcome: Adequate for Discharge   Problem: Elimination: Goal: Will not experience complications related to bowel motility Outcome: Adequate for Discharge Goal: Will not experience complications related to urinary retention Outcome: Adequate for Discharge   Problem: Pain Managment: Goal: General experience of comfort will improve and/or be controlled Outcome: Adequate for Discharge   Problem: Safety: Goal: Ability to remain free from injury will improve Outcome: Adequate for Discharge   Problem: Skin Integrity: Goal: Risk for impaired skin integrity will decrease Outcome: Adequate for Discharge   Problem: Education: Goal: Knowledge of disease or condition will improve Outcome: Adequate for Discharge Goal: Knowledge of the prescribed therapeutic regimen will improve Outcome: Adequate for Discharge Goal: Individualized Educational Video(s) Outcome: Adequate for Discharge   Problem: Activity: Goal: Ability to tolerate increased activity will improve Outcome: Adequate for Discharge Goal: Will verbalize the importance of balancing activity with adequate rest periods Outcome: Adequate for Discharge   Problem: Respiratory: Goal: Ability to maintain a clear airway will improve Outcome: Adequate for Discharge Goal: Levels of oxygenation will improve Outcome: Adequate for Discharge Goal: Ability to maintain adequate ventilation will improve Outcome: Adequate for Discharge

## 2023-10-17 NOTE — TOC Transition Note (Signed)
 Transition of Care [facility]) - Discharge Note   Patient Details  Name: Larry Callahan MRN: [id number] Date of Birth: January 23, 1956  Transition of Care [facility]) CM/SW Contact:  Loman Risk, RN Phone Number: 10/17/2023, 11:18 AM   Clinical Narrative:     Patient to discharge today Larry Callahan with [facility] notified of discharge     Barriers to Discharge: Continued Medical Work up   Patient Goals and CMS Choice     Choice offered to / list presented to : Patient      Discharge Placement                       Discharge Plan and Services Additional resources added to the After Visit Summary for       Post Acute Care Choice: Home Health                    HH Arranged: PT, OT, Nurse's Aide [facility] Agency: [facility] Date [facility] Agency Contacted: 10/16/23   Representative spoke with at [facility] Agency: Melvern Stack  Social Drivers of Health (SDOH) Interventions SDOH Screenings   Food Insecurity: No Food Insecurity (10/14/2023)  Housing: Low Risk  (10/14/2023)  Transportation Needs: No Transportation Needs (10/14/2023)  Utilities: Not At Risk (10/14/2023)  Alcohol Screen: Low Risk  (03/29/2022)  Depression (PHQ2-9): Low Risk  (09/13/2022)  Financial Resource Strain: Low Risk  (05/12/2021)  Social Connections: Unknown (10/14/2023)  Tobacco Use: Medium Risk (10/14/2023)     Readmission Risk Interventions    12/01/2021   12:25 PM 08/07/2021    2:36 PM 06/28/2021    2:26 PM  Readmission Risk Prevention Plan  Transportation Screening Complete Complete Complete  Medication Review Oceanographer) Complete Complete Complete  PCP or Specialist appointment within 3-5 days of discharge Complete Complete Complete  HRI or Home Care Consult Complete Complete Complete  SW Recovery Care/Counseling Consult Complete Complete Complete  Palliative Care Screening Complete Complete Not Applicable  Skilled Nursing Facility Complete Complete Not Applicable

## 2023-10-19 ENCOUNTER — Telehealth: Payer: Self-pay

## 2023-10-20 ENCOUNTER — Other Ambulatory Visit: Payer: Self-pay | Admitting: Nurse Practitioner

## 2023-10-20 DIAGNOSIS — G4733 Obstructive sleep apnea (adult) (pediatric): Secondary | ICD-10-CM

## 2023-10-20 DIAGNOSIS — I483 Typical atrial flutter: Secondary | ICD-10-CM

## 2023-10-20 DIAGNOSIS — I5022 Chronic systolic (congestive) heart failure: Secondary | ICD-10-CM

## 2023-10-20 DIAGNOSIS — N184 Chronic kidney disease, stage 4 (severe): Secondary | ICD-10-CM

## 2023-10-20 DIAGNOSIS — I739 Peripheral vascular disease, unspecified: Secondary | ICD-10-CM

## 2023-10-20 DIAGNOSIS — J449 Chronic obstructive pulmonary disease, unspecified: Secondary | ICD-10-CM

## 2023-10-20 DIAGNOSIS — R531 Weakness: Secondary | ICD-10-CM

## 2023-10-20 DIAGNOSIS — E1159 Type 2 diabetes mellitus with other circulatory complications: Secondary | ICD-10-CM

## 2023-10-20 NOTE — Telephone Encounter (Signed)
 Spoke with [facility] that  we placed order for homehealth

## 2023-10-23 ENCOUNTER — Telehealth: Payer: Self-pay | Admitting: Nurse Practitioner

## 2023-10-23 NOTE — Telephone Encounter (Signed)
 Home health order signed, faxed to [facility];[phone]. Scanned-[name]

## 2023-10-30 ENCOUNTER — Telehealth: Payer: Self-pay | Admitting: Nurse Practitioner

## 2023-10-30 ENCOUNTER — Encounter: Payer: Self-pay | Admitting: Nurse Practitioner

## 2023-10-30 ENCOUNTER — Ambulatory Visit (INDEPENDENT_AMBULATORY_CARE_PROVIDER_SITE_OTHER): Admitting: Nurse Practitioner

## 2023-10-30 VITALS — BP 190/86 | HR 81 | Temp 98.5°F | Resp 16 | Ht 71.0 in | Wt 191.4 lb

## 2023-10-30 DIAGNOSIS — E1169 Type 2 diabetes mellitus with other specified complication: Secondary | ICD-10-CM

## 2023-10-30 DIAGNOSIS — Z09 Encounter for follow-up examination after completed treatment for conditions other than malignant neoplasm: Secondary | ICD-10-CM | POA: Diagnosis not present

## 2023-10-30 DIAGNOSIS — E1159 Type 2 diabetes mellitus with other circulatory complications: Secondary | ICD-10-CM | POA: Diagnosis not present

## 2023-10-30 DIAGNOSIS — N184 Chronic kidney disease, stage 4 (severe): Secondary | ICD-10-CM

## 2023-10-30 DIAGNOSIS — Z794 Long term (current) use of insulin: Secondary | ICD-10-CM

## 2023-10-30 DIAGNOSIS — E1122 Type 2 diabetes mellitus with diabetic chronic kidney disease: Secondary | ICD-10-CM

## 2023-10-30 DIAGNOSIS — I152 Hypertension secondary to endocrine disorders: Secondary | ICD-10-CM

## 2023-10-30 DIAGNOSIS — J449 Chronic obstructive pulmonary disease, unspecified: Secondary | ICD-10-CM | POA: Diagnosis not present

## 2023-10-30 DIAGNOSIS — E871 Hypo-osmolality and hyponatremia: Secondary | ICD-10-CM

## 2023-10-30 DIAGNOSIS — E785 Hyperlipidemia, unspecified: Secondary | ICD-10-CM

## 2023-10-30 MED ORDER — CARVEDILOL 12.5 MG PO TABS: 12.5000 mg | ORAL_TABLET | Freq: Two times a day (BID) | ORAL | 3 refills | Status: DC

## 2023-10-30 MED ORDER — TOUJEO MAX SOLOSTAR 300 UNIT/ML ~~LOC~~ SOPN: 10.0000 [IU] | PEN_INJECTOR | Freq: Every day | SUBCUTANEOUS | Status: DC

## 2023-10-30 MED ORDER — AMLODIPINE BESYLATE 5 MG PO TABS: 5.0000 mg | ORAL_TABLET | Freq: Two times a day (BID) | ORAL | 3 refills | Status: DC

## 2023-10-30 NOTE — Progress Notes (Signed)
 [facility] [place], [place] [address] [place] [address] [phone]                                   Transitional Care Clinic   [facility] Discharge Acute Issues Care Follow Up                                                                        Patient Demographics  Larry Callahan, is a 68 y.o. male  DOB 1956/06/10  MRN [id number].  Primary MD  Laurence Pons, NP  Admission date: 10/14/2023 Discharge date: 10/17/2023  Reason for TCC follow Up - COPD with acute exacerbation Acute on chronic systolic CHF Acute on chronic anemia   Past Medical History:  Diagnosis Date   Allergy    Atrial flutter (HCC)    a. Dx 12/2020--CHA2DS2VASc = 5-->eliquis .   Chronic combined systolic (congestive) and diastolic (congestive) heart failure (HCC)    a. 04/2019 Echo: EF 45-50%; b. 02/2019 Echo: EF 55-60%; c. 09/2020 Echo: EF 35-40%, glob HK. Nl RV size/fxn. Mild MR; d. 07/2021 Echo: EF 40-45%, mild LVH, low-nl RV fxn, no MR.   CKD (chronic kidney disease), stage III (HCC)    COPD (chronic obstructive pulmonary disease) (HCC)    Coronary artery disease    a. 2011 Cath: nonobs dzs; b. 09/2020 MV: EF 38%. No signif ischemia. ? inf MI vs diaph attenuation. GI uptake artifact.   Diabetes mellitus without complication (HCC)    GERD (gastroesophageal reflux disease)    History of CVA (cerebrovascular accident) 09/25/2020   Hyperlipidemia    Hypertension    Morbid (severe) obesity due to excess calories (HCC) 06/09/2017   Morbid obesity (HCC)    NICM (nonischemic cardiomyopathy) (HCC)    a. 04/2019 Echo: EF 45-50%; b. 02/2019 Echo: EF 55-60%; c. 09/2020 Echo: EF 35-40%, glob HK; d. 09/2020 MV: No ischemia. ? inf infarct vs attenuation; d. 07/2021 Echo: EF 40-45%.   OSA (obstructive sleep apnea)    Pneumonia due to COVID-19 virus 01/30/2020   Stage 3b chronic kidney disease (HCC)    Stroke (HCC) 2024   Syncope and collapse 11/16/2020    Past Surgical  History:  Procedure Laterality Date   CARDIAC CATHETERIZATION     CHOLECYSTECTOMY     COLONOSCOPY WITH PROPOFOL  N/A 04/12/2019   Procedure: COLONOSCOPY WITH PROPOFOL ;  Surgeon: Selena Daily, MD;  Location: [name] ENDOSCOPY;  Service: Gastroenterology;  Laterality: N/A;   ESOPHAGOGASTRODUODENOSCOPY N/A 04/12/2019   Procedure: ESOPHAGOGASTRODUODENOSCOPY (EGD);  Surgeon: Selena Daily, MD;  Location: [facility] ENDOSCOPY;  Service: Gastroenterology;  Laterality: N/A;   ESOPHAGOGASTRODUODENOSCOPY (EGD) WITH PROPOFOL  N/A 11/21/2019   Procedure: ESOPHAGOGASTRODUODENOSCOPY (EGD) WITH PROPOFOL ;  Surgeon: Selena Daily, MD;  Location: [name] ENDOSCOPY;  Service: Gastroenterology;  Laterality: N/A;   ESOPHAGOGASTRODUODENOSCOPY (EGD) WITH PROPOFOL  N/A 01/11/2022   Procedure: ESOPHAGOGASTRODUODENOSCOPY (EGD) WITH PROPOFOL ;  Surgeon: Marnee Sink, MD;  Location: [name] ENDOSCOPY;  Service: Endoscopy;  Laterality: N/A;   ESOPHAGOGASTRODUODENOSCOPY (EGD) WITH PROPOFOL  N/A 08/02/2022   Procedure: ESOPHAGOGASTRODUODENOSCOPY (EGD) WITH PROPOFOL ;  Surgeon: Marnee Sink, MD;  Location: [name] ENDOSCOPY;  Service: Endoscopy;  Laterality: N/A;   FLEXIBLE  BRONCHOSCOPY Bilateral 05/17/2019   Procedure: FLEXIBLE BRONCHOSCOPY;  Surgeon: Cordie Deters, MD;  Location: [name] ORS;  Service: Pulmonary;  Laterality: Bilateral;   left arm surgery     nephrectomy Left    PARATHYROIDECTOMY     RIGHT HEART CATH N/A 03/29/2019   Procedure: RIGHT HEART CATH;  Surgeon: Devorah Fonder, MD;  Location: [name] INVASIVE CV LAB;  Service: Cardiovascular;  Laterality: N/A;       Recent HPI and Hospital Course  Hospital Course:  Larry Callahan is a 68 y.o. 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  He presents to [facility] regional ED after being home for 1  day postdischarge 10/12/23 with COPD exacerbation at that time. He called EMS because he has continued to feel short of breath at home and did not feel that he returned to his pre-admission baseline . He notes that he has not been taking all of his medications since discharge and reports he did not know that he needed to pick up any medications, he also does not have access to his nebulizer machine at home. Of note he lives up a steep driveway and EMS was unable to drive the ambulance to his front door, while EMTs assisted him to the ambulance he had a fall while walking down the driveway and found to have 2 abrasions: 1 on his knee and 1 on his elbow. No headstrike, he is on Eliquis .   ED Course: On arrival to [facility] regional ED patient was noted to be afebrile temp 36.6 C, BP 170/91, HR 72, RR 18, SpO2 98% on room air.  Labs notable for CO2 14, BUN 122, creatinine 4.17, albumin  3.1, hemoglobin 9.7 which is in line with recent baseline, normal iron  and TIBC and BNP 121.3.  CXR negative.  He was given DuoNebs and 125 mg of methylprednisolone . [facility] contacted for admission.     Assessment and Plan:   # CKD stage IV # Acute on Chronic Metabolic Acidosis Patient presented with shortness of breath, most likely acidotic breathing due to very low CO2.  S/p 100 mEQ Sodium Bicarb x 2 dose on 5/3 Pt was not on outpatient PO bicarb, so started oral bicarbonate 650 mg p.o. 3 times daily 5/5 increased sodium bicarbonate  1300 mg p.o. 3 times daily due to persistent low bicarb level.  5/6 stable today discharged on oral bicarbonate.  Repeat BMP after 1 week and follow with PCP.  Follow-up with nephrology in 1 week   # Dizziness and frequent falls On 5/5 orthostatic hypotension, noticed while standing, blood pressure immediately dropped but recovered after few minutes.  Patient was dizzy with blurry vision. Patient did ambulated with physical therapy, needs rolling walker and home health PT and OT. Since patient  was symptomatic so he does not feel comfortable going home today, we will plan to discharge him tomorrow a.m. 5/6 patient had an episode of hypoglycemia which resolved and patient feels fine, denies any dizziness.  Agreed to go home.    # COPD with Acute Exacerbation: s/p PO Prednisone  x 4 days Continue home Breztri , s/p Mucinex , Supplemental O2 prn  # Type II Diabetes Mellitus: Continue home basal insulin , monitor CBG at home and f/u with pcp # Chronic systolic CHF: Continue Coreg  and Hydralazine , resumed Lasix  and kdur 20 pod.    # Hypertension:  Continue Home Coreg  and Hydralazine  prn # OSA: Continue home CPAP # Atrial Flutter: Continue home Coreg  and Eliquis  # Fall with Skin Abrasions: continue Fall Precautions and may continue Bacitracin  # Anemia of chronic disease, baseline Hb around 9 Iron  profile within normal range. Hb 9.7--8.0--8.7--7.7 low, no active bleeding. F/u with pcp to repeat cbc in 1 wk   Body mass index is 28.38 kg/m.  Nutrition Interventions:   - Patient was instructed, not to drive, operate heavy machinery, perform activities at heights, swimming or participation in water activities or provide baby sitting services while on Pain, Sleep and Anxiety Medications; until his outpatient Physician has advised to do so again.  - Also recommended to not to take more than prescribed Pain, Sleep and Anxiety Medications.   Patient was seen by physical therapy, who recommended Home health, which was arranged. On the day of the discharge the patient's vitals were stable, and no other acute medical condition were reported by patient. the patient was felt safe to be discharge at Home with Home health.  Post Hospital Acute Care Issue to be followed in the Clinic   Active Problems:   COPD with acute exacerbation (HCC)   Acute on chronic systolic CHF (congestive heart failure) (HCC)   Acute on chronic anemia   Obstructive sleep apnea, adult   Gastroesophageal reflux disease without  esophagitis   Essential hypertension   Type 2 diabetes mellitus (HCC)   CKD (chronic kidney disease) stage 4, GFR 15-29 ml/min (HCC)   HLD (hyperlipidemia)   Atrial flutter (HCC)   COPD exacerbation (HCC)   Fall at home, initial encounter     Subjective:   Larry Callahan today has, No headache, No chest pain, No abdominal pain - No Nausea, No new weakness tingling or numbness, No Cough - SOB.   Assessment & Plan   1. Hospital discharge follow-up (Primary) Treated for COPD  2. Chronic obstructive pulmonary disease, unspecified COPD type (HCC) Breathing improved, treated with breathing treatments, steroids and antibiotics.   3. Type 2 diabetes mellitus with stage 4 chronic kidney disease, with long-term current use of insulin  (HCC) Keeps having low glucose levels due to significantly increased insulin  dose after discharge from the hospital. Repeat lab ordered. Toujeo  dose decreased to 10 units daily at bedtime.  - Basic Metabolic Panel (BMET) - insulin  glargine, 2 Unit Dial , (TOUJEO  MAX SOLOSTAR) 300 UNIT/ML Solostar Pen; Inject 10 Units into the skin at bedtime.  4. Hypertension associated with type 2 diabetes mellitus (HCC) BP is elevated due to patient not having his amlodipine . BP is controlled when he is on carvedilol , amlodipine  and hydralazine .  - amLODipine  (NORVASC ) 5 MG tablet; Take 1 tablet (5 mg total) by mouth 2 (two) times daily. Skip the dose if systolic BP less than 140 mmHg  Dispense: 180 tablet; Refill: 3 - carvedilol  (COREG ) 12.5 MG tablet; Take 1 tablet (12.5 mg total) by mouth 2 (two) times daily.  Dispense: 180 tablet; Refill: 3  5. Hyperlipidemia associated with type 2 diabetes mellitus (HCC) Atorvastatin  was discontinued and he is now on rosuvastatin . Continue as prescribed   6. Stage 4 chronic kidney disease (HCC) Repeat lab ordered - Basic Metabolic Panel (BMET)  7. Hyponatremia Repeat lab ordered - Basic Metabolic Panel (BMET)    Reason for  frequent admissions/ER visits    CHF COPD Anemia Diabetes Atrial flutter Hypertension History of falls CKD   Objective:   Vitals:   10/30/23 1122  BP: (!) 190/86  Pulse: 81  Resp: 16  Temp: 98.5 F (36.9 C)  SpO2: 97%  Weight: 191 lb 6.4 oz (86.8 kg)  Height: 5\' 11"  (1.803 m)    Wt Readings from Last 3 Encounters:  10/30/23 191 lb 6.4 oz (86.8 kg)  10/17/23 203 lb 7.8 oz (92.3 kg)  10/10/23 180 lb (81.6 kg)    Allergies as of 10/30/2023       Reactions   Penicillins Rash   Codeine  Nausea And Vomiting        Medication List        Accurate as of Oct 30, 2023 12:06 PM. If you have any questions, ask your nurse or doctor.          STOP taking these medications    loratadine  10 MG tablet Commonly known as: CLARITIN  Stopped by: Laurence Pons       TAKE these medications    Accu-Chek Guide test strip Generic drug: glucose blood Use to check blood sugars 4 times a day E11.65   Accu-Chek Softclix Lancets lancets Use to check blood sugars 4 times a day   E11.65   acetaminophen  325 MG tablet Commonly known as: TYLENOL  Take 2 tablets (650 mg total) by mouth every 6 (six) hours as needed for mild pain or moderate pain (or Fever >/= 101).   albuterol  108 (90 Base) MCG/ACT inhaler Commonly known as: VENTOLIN  HFA Inhale 1-2 puffs into the lungs every 4 (four) hours as needed for wheezing or shortness of breath.   amLODipine  5 MG tablet Commonly known as: NORVASC  Take 1 tablet (5 mg total) by mouth 2 (two) times daily. Skip the dose if systolic BP less than 140 mmHg   apixaban  5 MG Tabs tablet Commonly known as: Eliquis  Take 1 tablet (5 mg total) by mouth 2 (two) times daily.   Banophen 25 MG capsule Generic drug: diphenhydrAMINE  Take 1 capsule (25 mg total) by mouth every 8 (eight) hours as needed for itching.   Breztri  Aerosphere 160-9-4.8 MCG/ACT Aero inhaler Generic drug: budesonide -glycopyrrolate -formoterol  Inhale 2 puffs into the  lungs 2 (two) times daily.   carvedilol  12.5 MG tablet Commonly known as: COREG  Take 1 tablet (12.5 mg total) by mouth 2 (two) times daily.   cyanocobalamin  1000 MCG tablet Take 1 tablet (1,000 mcg total) by mouth daily.   ferrous sulfate  325 (65 FE) MG tablet Take 325 mg by mouth every evening.   furosemide  40 MG tablet Commonly known as: LASIX  Take 1 tablet (40 mg total) by mouth daily.   hydrALAZINE  100 MG tablet Commonly known as: APRESOLINE  Take 1 tablet (100 mg total) by mouth 3 (three) times daily as needed (SBP >160).   Insulin  lispro 100 UNIT/ML Commonly known as: HUMALOG  Junior KwikPen Inject 0-10 Units into the skin 3 (three) times daily.   ipratropium-albuterol  0.5-2.5 (3) MG/3ML Soln Commonly known as: DUONEB Take 3 mLs by nebulization every 6 (six) hours as needed.   methocarbamol  750 MG tablet Commonly known as: ROBAXIN  Take 1 tablet (750 mg total) by mouth every 6 (six) hours as needed for muscle spasms.   Novofine Pen Needle 32G X 6 MM Misc Generic drug: Insulin  Pen Needle Use as directed with toujeo  pen   pantoprazole  40 MG tablet Commonly known as: PROTONIX  Take 40 mg by mouth 2 (two) times daily.   PERIGUARD EX Apply 1 Application topically in the morning, at noon, and at bedtime.   polyethylene glycol powder 17 GM/SCOOP powder Commonly known as: GLYCOLAX /MIRALAX  Take 17 g by mouth daily.  potassium chloride  SA 20 MEQ tablet Commonly known as: KLOR-CON  M Take 1 tablet (20 mEq total) by mouth daily.   rosuvastatin  10 MG tablet Commonly known as: CRESTOR  Take 1 tablet (10 mg total) by mouth at bedtime.   sodium bicarbonate  650 MG tablet Take 2 tablets (1,300 mg total) by mouth 3 (three) times daily for 14 days, THEN 1 tablet (650 mg total) 3 (three) times daily. Start taking on: Oct 17, 2023   Toujeo  Max SoloStar 300 UNIT/ML Solostar Pen Generic drug: insulin  glargine (2 Unit Dial ) Inject 10 Units into the skin at bedtime. What changed:   how much to take additional instructions Changed by: Laurence Pons   True Metrix Air Glucose Meter Devi 1 Device by Does not apply route 2 (two) times daily.         Physical Exam: Constitutional: Patient appears well-developed and well-nourished. Not in obvious distress. HENT: Normocephalic, atraumatic, External right and left ear normal. Oropharynx is clear and moist.  Eyes: Conjunctivae and EOM are normal. PERRLA, no scleral icterus. Neck: Normal ROM. Neck supple. No JVD. No tracheal deviation. No thyromegaly. CVS: RRR, S1/S2 +, no murmurs, no gallops, no carotid bruit.  Pulmonary: Effort and breath sounds normal, no stridor, rhonchi, wheezes, rales.  Abdominal: Soft. BS +, no distension, tenderness, rebound or guarding.  Musculoskeletal: Normal range of motion. No edema and no tenderness.  Lymphadenopathy: No lymphadenopathy noted, cervical, inguinal or axillary Neuro: Alert. Normal reflexes, muscle tone coordination. No cranial nerve deficit. Skin: Skin is warm and dry. No rash noted. Not diaphoretic. No erythema. No pallor. Psychiatric: Normal mood and affect. Behavior, judgment, thought content normal.   Data Review   Micro Results No results found for this or any previous visit (from the past 240 hours).   CBC No results for input(s): "WBC", "HGB", "HCT", "PLT", "MCV", "MCH", "MCHC", "RDW", "LYMPHSABS", "MONOABS", "EOSABS", "BASOSABS", "BANDABS" in the last 168 hours.  Invalid input(s): "NEUTRABS", "BANDSABD"  Chemistries  No results for input(s): "NA", "K", "CL", "CO2", "GLUCOSE", "BUN", "CREATININE", "CALCIUM ", "MG", "AST", "ALT", "ALKPHOS", "BILITOT" in the last 168 hours.  Invalid input(s): "GFRCGP" ------------------------------------------------------------------------------------------------------------------ estimated creatinine clearance is 22.5 mL/min (A) (by C-G formula based on SCr of 3.25 mg/dL  (H)). ------------------------------------------------------------------------------------------------------------------ No results for input(s): "HGBA1C" in the last 72 hours. ------------------------------------------------------------------------------------------------------------------ No results for input(s): "CHOL", "HDL", "LDLCALC", "TRIG", "CHOLHDL", "LDLDIRECT" in the last 72 hours. ------------------------------------------------------------------------------------------------------------------ No results for input(s): "TSH", "T4TOTAL", "T3FREE", "THYROIDAB" in the last 72 hours.  Invalid input(s): "FREET3" ------------------------------------------------------------------------------------------------------------------ No results for input(s): "VITAMINB12", "FOLATE", "FERRITIN", "TIBC", "IRON ", "RETICCTPCT" in the last 72 hours.  Coagulation profile No results for input(s): "INR", "PROTIME" in the last 168 hours.  No results for input(s): "DDIMER" in the last 72 hours.  Cardiac Enzymes No results for input(s): "CKMB", "TROPONINI", "MYOGLOBIN" in the last 168 hours.  Invalid input(s): "CK" ------------------------------------------------------------------------------------------------------------------ Invalid input(s): "POCBNP"  Return in about 3 months (around 01/30/2024) for F/U, Recheck A1C, Biran Mayberry PCP.   Time Spent in minutes  45 Time spent with patient included reviewing progress notes, labs, imaging studies, and discussing plan for follow up.   This patient was seen by Laurence Pons, FNP-C in collaboration with Dr. Verneta Gone as a part of collaborative care agreement.    Laurence Pons MSN, FNP-C on 10/30/2023 at 12:06 PM   **Disclaimer: This note may have been dictated with voice recognition software. Similar sounding words can inadvertently be transcribed and this note may contain transcription errors which may not have been corrected upon publication  of  note.**

## 2023-10-30 NOTE — Telephone Encounter (Signed)
 Received PT orders from [facility]. Gave to Alyssa for signatures-[name]

## 2023-10-31 ENCOUNTER — Telehealth: Payer: Self-pay | Admitting: Nurse Practitioner

## 2023-10-31 NOTE — Telephone Encounter (Signed)
 PT orders signed. Faxed back to [facility]; [phone]. Scanned

## 2023-11-04 ENCOUNTER — Other Ambulatory Visit: Payer: Self-pay

## 2023-11-04 ENCOUNTER — Emergency Department

## 2023-11-04 ENCOUNTER — Inpatient Hospital Stay
Admission: EM | Admit: 2023-11-04 | Discharge: 2023-11-13 | DRG: 291 | Disposition: A | Discharge status: Home Health | Attending: Family Medicine | Admitting: Family Medicine

## 2023-11-04 DIAGNOSIS — E1165 Type 2 diabetes mellitus with hyperglycemia: Secondary | ICD-10-CM | POA: Diagnosis present

## 2023-11-04 DIAGNOSIS — N189 Chronic kidney disease, unspecified: Secondary | ICD-10-CM

## 2023-11-04 DIAGNOSIS — M549 Dorsalgia, unspecified: Secondary | ICD-10-CM | POA: Diagnosis present

## 2023-11-04 DIAGNOSIS — E119 Type 2 diabetes mellitus without complications: Secondary | ICD-10-CM

## 2023-11-04 DIAGNOSIS — I10 Essential (primary) hypertension: Secondary | ICD-10-CM | POA: Diagnosis present

## 2023-11-04 DIAGNOSIS — Z8616 Personal history of COVID-19: Secondary | ICD-10-CM

## 2023-11-04 DIAGNOSIS — I5023 Acute on chronic systolic (congestive) heart failure: Secondary | ICD-10-CM | POA: Diagnosis present

## 2023-11-04 DIAGNOSIS — Z794 Long term (current) use of insulin: Secondary | ICD-10-CM

## 2023-11-04 DIAGNOSIS — Z8249 Family history of ischemic heart disease and other diseases of the circulatory system: Secondary | ICD-10-CM

## 2023-11-04 DIAGNOSIS — K219 Gastro-esophageal reflux disease without esophagitis: Secondary | ICD-10-CM | POA: Diagnosis present

## 2023-11-04 DIAGNOSIS — D631 Anemia in chronic kidney disease: Secondary | ICD-10-CM | POA: Diagnosis present

## 2023-11-04 DIAGNOSIS — N184 Chronic kidney disease, stage 4 (severe): Secondary | ICD-10-CM | POA: Diagnosis present

## 2023-11-04 DIAGNOSIS — F17210 Nicotine dependence, cigarettes, uncomplicated: Secondary | ICD-10-CM | POA: Diagnosis present

## 2023-11-04 DIAGNOSIS — J449 Chronic obstructive pulmonary disease, unspecified: Secondary | ICD-10-CM | POA: Diagnosis present

## 2023-11-04 DIAGNOSIS — Z1152 Encounter for screening for COVID-19: Secondary | ICD-10-CM

## 2023-11-04 DIAGNOSIS — I483 Typical atrial flutter: Secondary | ICD-10-CM | POA: Diagnosis not present

## 2023-11-04 DIAGNOSIS — J438 Other emphysema: Secondary | ICD-10-CM

## 2023-11-04 DIAGNOSIS — I509 Heart failure, unspecified: Secondary | ICD-10-CM

## 2023-11-04 DIAGNOSIS — Z79899 Other long term (current) drug therapy: Secondary | ICD-10-CM

## 2023-11-04 DIAGNOSIS — I428 Other cardiomyopathies: Secondary | ICD-10-CM | POA: Diagnosis present

## 2023-11-04 DIAGNOSIS — R0602 Shortness of breath: Secondary | ICD-10-CM | POA: Diagnosis not present

## 2023-11-04 DIAGNOSIS — E1122 Type 2 diabetes mellitus with diabetic chronic kidney disease: Secondary | ICD-10-CM | POA: Diagnosis present

## 2023-11-04 DIAGNOSIS — Z8673 Personal history of transient ischemic attack (TIA), and cerebral infarction without residual deficits: Secondary | ICD-10-CM

## 2023-11-04 DIAGNOSIS — Z7901 Long term (current) use of anticoagulants: Secondary | ICD-10-CM

## 2023-11-04 DIAGNOSIS — D72819 Decreased white blood cell count, unspecified: Secondary | ICD-10-CM | POA: Diagnosis present

## 2023-11-04 DIAGNOSIS — N179 Acute kidney failure, unspecified: Secondary | ICD-10-CM | POA: Diagnosis not present

## 2023-11-04 DIAGNOSIS — I130 Hypertensive heart and chronic kidney disease with heart failure and stage 1 through stage 4 chronic kidney disease, or unspecified chronic kidney disease: Principal | ICD-10-CM | POA: Diagnosis present

## 2023-11-04 DIAGNOSIS — Z7951 Long term (current) use of inhaled steroids: Secondary | ICD-10-CM

## 2023-11-04 DIAGNOSIS — I4892 Unspecified atrial flutter: Secondary | ICD-10-CM | POA: Diagnosis present

## 2023-11-04 DIAGNOSIS — Z801 Family history of malignant neoplasm of trachea, bronchus and lung: Secondary | ICD-10-CM

## 2023-11-04 DIAGNOSIS — E785 Hyperlipidemia, unspecified: Secondary | ICD-10-CM | POA: Diagnosis present

## 2023-11-04 DIAGNOSIS — E875 Hyperkalemia: Secondary | ICD-10-CM | POA: Diagnosis present

## 2023-11-04 DIAGNOSIS — E872 Acidosis, unspecified: Secondary | ICD-10-CM | POA: Diagnosis present

## 2023-11-04 DIAGNOSIS — J441 Chronic obstructive pulmonary disease with (acute) exacerbation: Secondary | ICD-10-CM | POA: Diagnosis present

## 2023-11-04 DIAGNOSIS — Z905 Acquired absence of kidney: Secondary | ICD-10-CM

## 2023-11-04 DIAGNOSIS — Z833 Family history of diabetes mellitus: Secondary | ICD-10-CM

## 2023-11-04 DIAGNOSIS — G4733 Obstructive sleep apnea (adult) (pediatric): Secondary | ICD-10-CM | POA: Diagnosis present

## 2023-11-04 DIAGNOSIS — Z880 Allergy status to penicillin: Secondary | ICD-10-CM

## 2023-11-04 DIAGNOSIS — I2510 Atherosclerotic heart disease of native coronary artery without angina pectoris: Secondary | ICD-10-CM | POA: Diagnosis present

## 2023-11-04 DIAGNOSIS — G44209 Tension-type headache, unspecified, not intractable: Secondary | ICD-10-CM | POA: Diagnosis present

## 2023-11-04 LAB — COMPREHENSIVE METABOLIC PANEL WITH GFR
ALT: 12 U/L (ref 0–44)
AST: 24 U/L (ref 15–41)
Albumin: 2.5 g/dL — ABNORMAL LOW (ref 3.5–5.0)
Alkaline Phosphatase: 68 U/L (ref 38–126)
Anion gap: 12 (ref 5–15)
BUN: 84 mg/dL — ABNORMAL HIGH (ref 8–23)
CO2: 14 mmol/L — ABNORMAL LOW (ref 22–32)
Calcium: 7.9 mg/dL — ABNORMAL LOW (ref 8.9–10.3)
Chloride: 109 mmol/L (ref 98–111)
Creatinine, Ser: 5.22 mg/dL — ABNORMAL HIGH (ref 0.61–1.24)
GFR, Estimated: 11 mL/min — ABNORMAL LOW (ref >60)
Glucose, Bld: 168 mg/dL — ABNORMAL HIGH (ref 70–99)
Potassium: 5.4 mmol/L — ABNORMAL HIGH (ref 3.5–5.1)
Sodium: 135 mmol/L (ref 135–145)
Total Bilirubin: 0.7 mg/dL (ref 0.0–1.2)
Total Protein: 6.6 g/dL (ref 6.5–8.1)

## 2023-11-04 LAB — CBC WITH DIFFERENTIAL/PLATELET
Abs Immature Granulocytes: 0.03 10*3/uL (ref 0.00–0.07)
Basophils Absolute: 0 10*3/uL (ref 0.0–0.1)
Basophils Relative: 0 %
Eosinophils Absolute: 0 10*3/uL (ref 0.0–0.5)
Eosinophils Relative: 1 %
HCT: 26.2 % — ABNORMAL LOW (ref 39.0–52.0)
Hemoglobin: 8.1 g/dL — ABNORMAL LOW (ref 13.0–17.0)
Immature Granulocytes: 1 %
Lymphocytes Relative: 19 %
Lymphs Abs: 0.5 10*3/uL — ABNORMAL LOW (ref 0.7–4.0)
MCH: 29 pg (ref 26.0–34.0)
MCHC: 30.9 g/dL (ref 30.0–36.0)
MCV: 93.9 fL (ref 80.0–100.0)
Monocytes Absolute: 0.2 10*3/uL (ref 0.1–1.0)
Monocytes Relative: 8 %
Neutro Abs: 1.9 10*3/uL (ref 1.7–7.7)
Neutrophils Relative %: 71 %
Platelets: 165 10*3/uL (ref 150–400)
RBC: 2.79 MIL/uL — ABNORMAL LOW (ref 4.22–5.81)
RDW: 13.3 % (ref 11.5–15.5)
WBC: 2.7 10*3/uL — ABNORMAL LOW (ref 4.0–10.5)
nRBC: 0 % (ref 0.0–0.2)

## 2023-11-04 LAB — RESP PANEL BY RT-PCR (RSV, FLU A&B, COVID)  RVPGX2
Influenza A by PCR: NEGATIVE
Influenza B by PCR: NEGATIVE
Resp Syncytial Virus by PCR: NEGATIVE
SARS Coronavirus 2 by RT PCR: NEGATIVE

## 2023-11-04 LAB — BRAIN NATRIURETIC PEPTIDE: B Natriuretic Peptide: 249 pg/mL — ABNORMAL HIGH (ref 0.0–100.0)

## 2023-11-04 LAB — TROPONIN I (HIGH SENSITIVITY)
Troponin I (High Sensitivity): 12 ng/L (ref <18)
Troponin I (High Sensitivity): 12 ng/L (ref <18)

## 2023-11-04 LAB — GLUCOSE, CAPILLARY
Glucose-Capillary: 202 mg/dL — ABNORMAL HIGH (ref 70–99)
Glucose-Capillary: 315 mg/dL — ABNORMAL HIGH (ref 70–99)

## 2023-11-04 LAB — BLOOD GAS, VENOUS
Acid-base deficit: 8.5 mmol/L — ABNORMAL HIGH (ref 0.0–2.0)
Bicarbonate: 16.5 mmol/L — ABNORMAL LOW (ref 20.0–28.0)
O2 Saturation: 87.7 %
Patient temperature: 37
pCO2, Ven: 32 mmHg — ABNORMAL LOW (ref 44–60)
pH, Ven: 7.32 (ref 7.25–7.43)
pO2, Ven: 57 mmHg — ABNORMAL HIGH (ref 32–45)

## 2023-11-04 MED ORDER — ROSUVASTATIN CALCIUM 10 MG PO TABS
10.0000 mg | ORAL_TABLET | Freq: Every day | ORAL | Status: DC
  Administered 2023-11-04 – 2023-11-12 (×9): 10 mg via ORAL
  Filled 2023-11-04 (×9): qty 1

## 2023-11-04 MED ORDER — FERROUS SULFATE 325 (65 FE) MG PO TABS
325.0000 mg | ORAL_TABLET | Freq: Every evening | ORAL | Status: DC
  Administered 2023-11-04 – 2023-11-12 (×9): 325 mg via ORAL
  Filled 2023-11-04 (×9): qty 1

## 2023-11-04 MED ORDER — FUROSEMIDE 10 MG/ML IJ SOLN
40.0000 mg | Freq: Two times a day (BID) | INTRAMUSCULAR | Status: DC
  Administered 2023-11-04 – 2023-11-05 (×2): 40 mg via INTRAVENOUS
  Filled 2023-11-04 (×2): qty 4

## 2023-11-04 MED ORDER — BUDESON-GLYCOPYRROL-FORMOTEROL 160-9-4.8 MCG/ACT IN AERO
2.0000 | INHALATION_SPRAY | Freq: Two times a day (BID) | RESPIRATORY_TRACT | Status: DC
  Administered 2023-11-05 – 2023-11-13 (×15): 2 via RESPIRATORY_TRACT
  Filled 2023-11-04 (×2): qty 5.9

## 2023-11-04 MED ORDER — SODIUM ZIRCONIUM CYCLOSILICATE 10 G PO PACK
10.0000 g | PACK | Freq: Once | ORAL | Status: AC
  Administered 2023-11-04: 10 g via ORAL
  Filled 2023-11-04 (×2): qty 1

## 2023-11-04 MED ORDER — VITAMIN B-12 1000 MCG PO TABS
1000.0000 ug | ORAL_TABLET | Freq: Every day | ORAL | Status: DC
  Administered 2023-11-05 – 2023-11-13 (×9): 1000 ug via ORAL
  Filled 2023-11-04 (×9): qty 1

## 2023-11-04 MED ORDER — SODIUM CHLORIDE 0.9% FLUSH
3.0000 mL | Freq: Two times a day (BID) | INTRAVENOUS | Status: DC
  Administered 2023-11-04 – 2023-11-13 (×18): 3 mL via INTRAVENOUS

## 2023-11-04 MED ORDER — FUROSEMIDE 10 MG/ML IJ SOLN
40.0000 mg | Freq: Once | INTRAMUSCULAR | Status: AC
  Administered 2023-11-04: 40 mg via INTRAVENOUS
  Filled 2023-11-04: qty 4

## 2023-11-04 MED ORDER — INSULIN ASPART 100 UNIT/ML IJ SOLN: 0.0000 [IU] | Freq: Three times a day (TID) | INTRAMUSCULAR | Status: DC

## 2023-11-04 MED ORDER — ONDANSETRON HCL 4 MG/2ML IJ SOLN: 4.0000 mg | Freq: Four times a day (QID) | INTRAMUSCULAR | Status: DC | PRN

## 2023-11-04 MED ORDER — ACETAMINOPHEN 325 MG PO TABS
650.0000 mg | ORAL_TABLET | ORAL | Status: DC | PRN
  Administered 2023-11-06 – 2023-11-09 (×5): 650 mg via ORAL
  Filled 2023-11-04 (×5): qty 2

## 2023-11-04 MED ORDER — APIXABAN 5 MG PO TABS
5.0000 mg | ORAL_TABLET | Freq: Two times a day (BID) | ORAL | Status: DC
  Administered 2023-11-04 – 2023-11-13 (×18): 5 mg via ORAL
  Filled 2023-11-04 (×18): qty 1

## 2023-11-04 MED ORDER — CARVEDILOL 6.25 MG PO TABS
12.5000 mg | ORAL_TABLET | Freq: Two times a day (BID) | ORAL | Status: DC
  Administered 2023-11-04 – 2023-11-13 (×18): 12.5 mg via ORAL
  Filled 2023-11-04: qty 1
  Filled 2023-11-04: qty 2
  Filled 2023-11-04: qty 1
  Filled 2023-11-04 (×3): qty 2
  Filled 2023-11-04: qty 1
  Filled 2023-11-04 (×4): qty 2
  Filled 2023-11-04: qty 1
  Filled 2023-11-04 (×6): qty 2

## 2023-11-04 MED ORDER — SODIUM BICARBONATE 650 MG PO TABS
1300.0000 mg | ORAL_TABLET | Freq: Three times a day (TID) | ORAL | Status: DC
  Administered 2023-11-04 – 2023-11-11 (×20): 1300 mg via ORAL
  Filled 2023-11-04 (×20): qty 2

## 2023-11-04 MED ORDER — INSULIN GLARGINE-YFGN 100 UNIT/ML ~~LOC~~ SOLN
5.0000 [IU] | Freq: Every day | SUBCUTANEOUS | Status: DC
  Administered 2023-11-04 – 2023-11-05 (×2): 5 [IU] via SUBCUTANEOUS
  Filled 2023-11-04 (×2): qty 0.05

## 2023-11-04 MED ORDER — SODIUM CHLORIDE 0.9 % IV SOLN: 250.0000 mL | INTRAVENOUS | Status: AC | PRN

## 2023-11-04 MED ORDER — SODIUM CHLORIDE 0.9% FLUSH: 3.0000 mL | INTRAVENOUS | Status: DC | PRN

## 2023-11-04 MED ORDER — INSULIN ASPART 100 UNIT/ML IJ SOLN
0.0000 [IU] | Freq: Three times a day (TID) | INTRAMUSCULAR | Status: DC
  Administered 2023-11-04: 3 [IU] via SUBCUTANEOUS
  Administered 2023-11-05: 7 [IU] via SUBCUTANEOUS
  Administered 2023-11-05: 9 [IU] via SUBCUTANEOUS
  Administered 2023-11-05: 1 [IU] via SUBCUTANEOUS
  Administered 2023-11-06 (×2): 2 [IU] via SUBCUTANEOUS
  Administered 2023-11-06: 5 [IU] via SUBCUTANEOUS
  Administered 2023-11-07: 3 [IU] via SUBCUTANEOUS
  Administered 2023-11-07: 1 [IU] via SUBCUTANEOUS
  Administered 2023-11-07: 2 [IU] via SUBCUTANEOUS
  Administered 2023-11-08: 5 [IU] via SUBCUTANEOUS
  Administered 2023-11-08: 2 [IU] via SUBCUTANEOUS
  Administered 2023-11-09: 7 [IU] via SUBCUTANEOUS
  Administered 2023-11-09: 3 [IU] via SUBCUTANEOUS
  Administered 2023-11-09: 5 [IU] via SUBCUTANEOUS
  Administered 2023-11-10: 2 [IU] via SUBCUTANEOUS
  Administered 2023-11-10: 7 [IU] via SUBCUTANEOUS
  Administered 2023-11-10: 5 [IU] via SUBCUTANEOUS
  Administered 2023-11-11: 3 [IU] via SUBCUTANEOUS
  Administered 2023-11-11: 7 [IU] via SUBCUTANEOUS
  Administered 2023-11-11: 2 [IU] via SUBCUTANEOUS
  Administered 2023-11-12: 7 [IU] via SUBCUTANEOUS
  Administered 2023-11-12: 3 [IU] via SUBCUTANEOUS
  Administered 2023-11-12: 2 [IU] via SUBCUTANEOUS
  Administered 2023-11-13 (×2): 3 [IU] via SUBCUTANEOUS
  Filled 2023-11-04 (×26): qty 1

## 2023-11-04 MED ORDER — AMLODIPINE BESYLATE 5 MG PO TABS
5.0000 mg | ORAL_TABLET | Freq: Two times a day (BID) | ORAL | Status: DC
  Administered 2023-11-04 – 2023-11-13 (×18): 5 mg via ORAL
  Filled 2023-11-04 (×18): qty 1

## 2023-11-04 MED ORDER — PANTOPRAZOLE SODIUM 40 MG PO TBEC
40.0000 mg | DELAYED_RELEASE_TABLET | Freq: Two times a day (BID) | ORAL | Status: DC
  Administered 2023-11-04 – 2023-11-06 (×4): 40 mg via ORAL
  Filled 2023-11-04 (×4): qty 1

## 2023-11-04 MED ORDER — METHYLPREDNISOLONE SODIUM SUCC 125 MG IJ SOLR
125.0000 mg | Freq: Once | INTRAMUSCULAR | Status: AC
  Administered 2023-11-04: 125 mg via INTRAVENOUS
  Filled 2023-11-04: qty 2

## 2023-11-04 MED ORDER — IPRATROPIUM-ALBUTEROL 0.5-2.5 (3) MG/3ML IN SOLN
3.0000 mL | RESPIRATORY_TRACT | Status: DC | PRN
  Administered 2023-11-06: 3 mL via RESPIRATORY_TRACT
  Filled 2023-11-04 (×2): qty 3

## 2023-11-04 MED ORDER — IPRATROPIUM-ALBUTEROL 0.5-2.5 (3) MG/3ML IN SOLN
9.0000 mL | Freq: Once | RESPIRATORY_TRACT | Status: AC
  Administered 2023-11-04: 9 mL via RESPIRATORY_TRACT
  Filled 2023-11-04: qty 3

## 2023-11-04 NOTE — Assessment & Plan Note (Signed)
 Initial concern for COPD exacerbation, however no significant wheezing on examination.  Suspect CHF is driving his symptoms.  - DuoNebs as needed - Hold off on further steroids - Continue home bronchodilators

## 2023-11-04 NOTE — Assessment & Plan Note (Signed)
 S/p ablation at [facility] in December 2024.  EKG today with either junctional versus sinus rhythm.Per chart review, patient had previous junctional rhythm on EKG post ablation.  - Continue home regimen

## 2023-11-04 NOTE — Assessment & Plan Note (Signed)
-   Resume home bicarb tablets

## 2023-11-04 NOTE — ED Triage Notes (Signed)
 C/O SOB and cough today.  Patient states he has history of heart failure.  AAOx3.  Skin warm and dry.  DOE.

## 2023-11-04 NOTE — Assessment & Plan Note (Signed)
 History of HFrEF with EF of 40% November 2024, with suspicion to be secondary to atrial flutter, s/p ablation.  Patient is now presenting with several days of shortness of breath and intermittently productive cough, with elevated BNP compared compared to his baseline.  Predominantly pulmonary edema on examination.  - Telemetry monitoring - Echocardiogram ordered - Daily weights - BNP - Lasix  40 mg IV twice daily - Electrolyte monitoring per pharmacy protocol

## 2023-11-04 NOTE — Consult Note (Signed)
 PHARMACY CONSULT NOTE - ELECTROLYTES  Pharmacy Consult for Electrolyte Monitoring and Replacement   Recent Labs: Weight: 84.3 kg (185 lb 13.6 oz) Estimated Creatinine Clearance: 14 mL/min (A) (by C-G formula based on SCr of 5.22 mg/dL (H)). Potassium (mmol/L)  Date Value  11/04/2023 5.4 (H)  03/06/2013 3.9   Magnesium  (mg/dL)  Date Value  19/14/7829 1.9  12/11/2012 1.8   Calcium  (mg/dL)  Date Value  56/21/3086 7.9 (L)   Calcium , Total (mg/dL)  Date Value  57/84/6962 9.3   Albumin  (g/dL)  Date Value  95/28/4132 2.5 (L)  07/15/2021 3.4 (L)  03/06/2013 3.4   Phosphorus (mg/dL)  Date Value  44/06/270 3.3   Sodium (mmol/L)  Date Value  11/04/2023 135  07/15/2021 136  03/06/2013 138    Assessment  Larry Callahan is a 68 y.o. male presenting with shortness or breath (HF exacerbation). PMH significant for  HFrEF with last EF of 40% secondary to nonischemic cardiomyopathy, atrial flutter s/p ablation on Eliquis , CKD stage IV, hypertension, type 2 diabetes, COPD, OSA, hyperlipidemia, nonobstructive CAD, and CVA. Pharmacy has been consulted to monitor and replace electrolytes.  Diet: none yet MIVF: IV lock Pertinent medications: Lokelma, furosemide , insulin   Goal of Therapy: Electrolytes WNL  Plan:  K elevated, lokelma and furosemide  already ordered by provider. No additional orders needed this time. Mg and phos within normal limits.  Check BMP, Mg, Phos with AM labs  Larry Callahan PharmD, BCPS 11/04/2023 6:04 PM

## 2023-11-04 NOTE — ED Provider Notes (Signed)
 [facility] Provider Note    Event Date/Time   First MD Initiated Contact with Patient 11/04/23 [phone]     (approximate)   History   Shortness of Breath   HPI  Elman Dettman is a 68 y.o. male with history of CHF, COPD, CKD, on Eliquis , presenting with shortness of breath for the last 2 to 3 days.  States that he has had nonproductive cough, no fever or chest pain or leg swelling.  States that he is been taking his medications as prescribed but the shortness of breath has gotten worse over the course of the last couple days.  States that he takes a couple steps and is short of breath.  On independent review, he was admitted in early May for COPD exacerbation, does have history of nonischemic cardiomyopathy with a EF of 4045%, history of a flutter status post ablation on Eliquis .     Physical Exam   Triage Vital Signs: ED Triage Vitals  Encounter Vitals Group     BP 11/04/23 1424 131/74     Systolic BP Percentile --      Diastolic BP Percentile --      Pulse Rate 11/04/23 1424 64     Resp 11/04/23 1424 20     Temp --      Temp Source 11/04/23 1424 Oral     SpO2 11/04/23 1424 100 %     Weight 11/04/23 1434 185 lb 13.6 oz (84.3 kg)     Height --      Head Circumference --      Peak Flow --      Pain Score 11/04/23 1423 0     Pain Loc --      Pain Education --      Exclude from Growth Chart --     Most recent vital signs: Vitals:   11/04/23 1424  BP: 131/74  Pulse: 64  Resp: 20  SpO2: 100%     General: Awake, no distress.  CV:  Good peripheral perfusion.  Resp:  Normal effort.  Wheezing bilaterally, tachypneic Abd:  No distention.  Soft nontender Other:  No lower extremity edema, no unilateral calf swelling or tenderness.   ED Results / Procedures / Treatments   Labs (all labs ordered are listed, but only abnormal results are displayed) Labs Reviewed  CBC WITH DIFFERENTIAL/PLATELET - Abnormal; Notable for the following  components:      Result Value   WBC 2.7 (*)    RBC 2.79 (*)    Hemoglobin 8.1 (*)    HCT 26.2 (*)    Lymphs Abs 0.5 (*)    All other components within normal limits  COMPREHENSIVE METABOLIC PANEL WITH GFR - Abnormal; Notable for the following components:   Potassium 5.4 (*)    CO2 14 (*)    Glucose, Bld 168 (*)    BUN 84 (*)    Creatinine, Ser 5.22 (*)    Calcium  7.9 (*)    Albumin  2.5 (*)    GFR, Estimated 11 (*)    All other components within normal limits  BRAIN NATRIURETIC PEPTIDE - Abnormal; Notable for the following components:   B Natriuretic Peptide 249.0 (*)    All other components within normal limits  BLOOD GAS, VENOUS - Abnormal; Notable for the following components:   pCO2, Ven 32 (*)    pO2, Ven 57 (*)    Bicarbonate 16.5 (*)    Acid-base deficit 8.5 (*)  All other components within normal limits  RESP PANEL BY RT-PCR (RSV, FLU A&B, COVID)  RVPGX2  TROPONIN I (HIGH SENSITIVITY)  TROPONIN I (HIGH SENSITIVITY)     EKG  EKG shows, sinus rhythm, rate of 65, normal QRS, normal QTc, no ischemic ST elevation, T wave flattening in 1, 2, aVL, V5, V6, not significant change compared to prior   RADIOLOGY On my independent interpretation, chest x-ray shows vascular congestion   PROCEDURES:  Critical Care performed: No  Procedures   MEDICATIONS ORDERED IN ED: Medications  sodium zirconium cyclosilicate (LOKELMA) packet 10 g (has no administration in time range)  furosemide  (LASIX ) injection 40 mg (40 mg Intravenous Given 11/04/23 1548)  ipratropium-albuterol  (DUONEB) 0.5-2.5 (3) MG/3ML nebulizer solution 9 mL (9 mLs Nebulization Given 11/04/23 1548)  methylPREDNISolone  sodium succinate (SOLU-MEDROL ) 125 mg/2 mL injection 125 mg (125 mg Intravenous Given 11/04/23 1548)     IMPRESSION / MDM / ASSESSMENT AND PLAN / ED COURSE  I reviewed the triage vital signs and the nursing notes.                              Differential diagnosis includes, but is not  limited to, CHF exacerbation, COPD exacerbation, viral illness, pneumonia, atypical ACS, electrolyte derangements.  Will get labs, EKG, troponin, chest x-ray, BNP.  Will give him some DuoNebs, Solu-Medrol .  Patient's presentation is most consistent with acute presentation with potential threat to life or bodily function.  Independent interpretation of labs and imaging below.  Chest x-ray showed vascular congestion, will also give him a dose of IV Lasix  here.  Labs also noted hyperkalemia, no new EKG changes, will give him some Lokelma here.  Given the AKI, CHF is COPD exacerbation, he will need to be admitted for further management.  Consult to hospitalist was agreeable with plan for admission and will evaluate the patient.  He is admitted.  The patient is on the cardiac monitor to evaluate for evidence of arrhythmia and/or significant heart rate changes.   Clinical Course as of 11/04/23 1717  Sat Nov 04, 2023  1521 DG Chest Portable 1 View IMPRESSION: Cardiomegaly with vascular congestion. No acute process by portable radiography.   [TT]  1648 Independent review of labs, VBG pH is normal, he has an AKI compared to prior, also mildly hyperkalemic without new EKG changes, respiratory viral panel is negative, he has mild leukopenia, H&H is higher than before, BNP is also elevated, troponin is normal. [TT]  1649 For the hyperkalemia, will give him some Lokelma as well. [TT]    Clinical Course User Index [TT] Shane Darling, MD     FINAL CLINICAL IMPRESSION(S) / ED DIAGNOSES   Final diagnoses:  AKI (acute kidney injury) (HCC)  COPD exacerbation (HCC)  Acute on chronic congestive heart failure, unspecified heart failure type (HCC)     Rx / DC Orders   ED Discharge Orders     None        Note:  This document was prepared using Dragon voice recognition software and may include unintentional dictation errors.    Shane Darling, MD 11/04/23 [phone]

## 2023-11-04 NOTE — H&P (Signed)
 History and Physical    Patient: Larry Callahan ZOX:[id number] DOB: 26-Apr-1956 DOA: 11/04/2023 DOS: the patient was seen and examined on 11/04/2023 PCP: Laurence Pons, NP  Patient coming from: Home  Chief Complaint:  Chief Complaint  Patient presents with   Shortness of Breath   HPI: Larry Callahan is a 68 y.o. male with medical history significant of HFrEF with last EF of 40% secondary to nonischemic cardiomyopathy, atrial flutter s/p ablation on Eliquis , CKD stage IV, hypertension, type 2 diabetes, COPD, OSA, hyperlipidemia, nonobstructive CAD, CVA, who presents to the ED due to shortness of breath.  [name] states that for the last several days, he has been experiencing shortness of breath with a intermittently productive cough.  During this time, he has not experienced any fever, chills, congestion, nausea, vomiting, diarrhea, abdominal pain.  He states that he does not notice when or if he has lower extremity swelling.  He has noticed that he has been having dyspnea on exertion is quite limited in his ability to ambulate at home now, even with his walker.  He has been taking his medications as prescribed.  Per chart review, patient has been hospitalized twice in the last several weeks, initially due to pneumonia and then due to COPD exacerbation with orthostasis.  On most recent discharge, he was instructed to take Lasix  once daily and increase his dose of sodium bicarb.  He notes that his most recent PCP visit, he was told to stop taking his sodium bicarb.  ED course: On arrival to the ED, patient was normotensive at 131/74 with heart rate of 64.  He was saturating at 100% on room air.  He was afebrile.  Initial workup notable for WBC 2.7, hemoglobin 8.1, potassium 5.4, BUN 84, creatinine 5.22, bicarb 14, and GFR of 11.  BNP elevated to 49.  Troponin negative at 12.  COVID-19, influenza and RSV PCR negative.  Chest x-ray with cardiomegaly and vascular congestion.   Patient started on DuoNebs, IV Lasix , Solu-Medrol , Lokelma.  [facility] contacted for admission.   Review of Systems: As mentioned in the history of present illness. All other systems reviewed and are negative.  Past Medical History:  Diagnosis Date   Allergy    Atrial flutter (HCC)    a. Dx 12/2020--CHA2DS2VASc = 5-->eliquis .   Chronic combined systolic (congestive) and diastolic (congestive) heart failure (HCC)    a. 04/2019 Echo: EF 45-50%; b. 02/2019 Echo: EF 55-60%; c. 09/2020 Echo: EF 35-40%, glob HK. Nl RV size/fxn. Mild MR; d. 07/2021 Echo: EF 40-45%, mild LVH, low-nl RV fxn, no MR.   CKD (chronic kidney disease), stage III (HCC)    COPD (chronic obstructive pulmonary disease) (HCC)    Coronary artery disease    a. 2011 Cath: nonobs dzs; b. 09/2020 MV: EF 38%. No signif ischemia. ? inf MI vs diaph attenuation. GI uptake artifact.   Diabetes mellitus without complication (HCC)    GERD (gastroesophageal reflux disease)    History of CVA (cerebrovascular accident) 09/25/2020   Hyperlipidemia    Hypertension    Morbid (severe) obesity due to excess calories (HCC) 06/09/2017   Morbid obesity (HCC)    NICM (nonischemic cardiomyopathy) (HCC)    a. 04/2019 Echo: EF 45-50%; b. 02/2019 Echo: EF 55-60%; c. 09/2020 Echo: EF 35-40%, glob HK; d. 09/2020 MV: No ischemia. ? inf infarct vs attenuation; d. 07/2021 Echo: EF 40-45%.   OSA (obstructive sleep apnea)    Pneumonia due to COVID-19 virus 01/30/2020   Stage 3b  chronic kidney disease (HCC)    Stroke (HCC) 2024   Syncope and collapse 11/16/2020   Past Surgical History:  Procedure Laterality Date   CARDIAC CATHETERIZATION     CHOLECYSTECTOMY     COLONOSCOPY WITH PROPOFOL  N/A 04/12/2019   Procedure: COLONOSCOPY WITH PROPOFOL ;  Surgeon: Selena Daily, MD;  Location: [name] ENDOSCOPY;  Service: Gastroenterology;  Laterality: N/A;   ESOPHAGOGASTRODUODENOSCOPY N/A 04/12/2019   Procedure: ESOPHAGOGASTRODUODENOSCOPY (EGD);  Surgeon: Selena Daily,  MD;  Location: [facility] ENDOSCOPY;  Service: Gastroenterology;  Laterality: N/A;   ESOPHAGOGASTRODUODENOSCOPY (EGD) WITH PROPOFOL  N/A 11/21/2019   Procedure: ESOPHAGOGASTRODUODENOSCOPY (EGD) WITH PROPOFOL ;  Surgeon: Selena Daily, MD;  Location: [name] ENDOSCOPY;  Service: Gastroenterology;  Laterality: N/A;   ESOPHAGOGASTRODUODENOSCOPY (EGD) WITH PROPOFOL  N/A 01/11/2022   Procedure: ESOPHAGOGASTRODUODENOSCOPY (EGD) WITH PROPOFOL ;  Surgeon: Marnee Sink, MD;  Location: [name] ENDOSCOPY;  Service: Endoscopy;  Laterality: N/A;   ESOPHAGOGASTRODUODENOSCOPY (EGD) WITH PROPOFOL  N/A 08/02/2022   Procedure: ESOPHAGOGASTRODUODENOSCOPY (EGD) WITH PROPOFOL ;  Surgeon: Marnee Sink, MD;  Location: [name] ENDOSCOPY;  Service: Endoscopy;  Laterality: N/A;   FLEXIBLE BRONCHOSCOPY Bilateral 05/17/2019   Procedure: FLEXIBLE BRONCHOSCOPY;  Surgeon: Cordie Deters, MD;  Location: [name] ORS;  Service: Pulmonary;  Laterality: Bilateral;   left arm surgery     nephrectomy Left    PARATHYROIDECTOMY     RIGHT HEART CATH N/A 03/29/2019   Procedure: RIGHT HEART CATH;  Surgeon: Devorah Fonder, MD;  Location: [name] INVASIVE CV LAB;  Service: Cardiovascular;  Laterality: N/A;   Social History:  reports that he has been smoking cigarettes. He has quit using smokeless tobacco.  His smokeless tobacco use included chew. He reports that he does not drink alcohol and does not use drugs.  Allergies  Allergen Reactions   Penicillins Rash   Codeine  Nausea And Vomiting    Family History  Problem Relation Age of Onset   Diabetes Mother        2003   Lung cancer Father    Diabetes Sister    Hypertension Sister    Heart disease Sister    Cancer Sister    Bone cancer Brother    Prostate cancer Neg Hx    Bladder Cancer Neg Hx    Kidney cancer Neg Hx     Prior to Admission medications   Medication Sig Start Date End Date Taking? Authorizing Provider  ACCU-CHEK GUIDE test strip Use to check blood sugars 4 times a day E11.65  03/08/23   Laurence Pons, NP  Accu-Chek Softclix Lancets lancets Use to check blood sugars 4 times a day   E11.65 08/20/21   Laurence Pons, NP  acetaminophen  (TYLENOL ) 325 MG tablet Take 2 tablets (650 mg total) by mouth every 6 (six) hours as needed for mild pain or moderate pain (or Fever >/= 101). 08/03/22   Althia Atlas, MD  albuterol  (VENTOLIN  HFA) 108 (90 Base) MCG/ACT inhaler Inhale 1-2 puffs into the lungs every 4 (four) hours as needed for wheezing or shortness of breath. 10/12/23   Alexander, Natalie, DO  amLODipine  (NORVASC ) 5 MG tablet Take 1 tablet (5 mg total) by mouth 2 (two) times daily. Skip the dose if systolic BP less than 140 mmHg 10/30/23   Abernathy, Janeice Medal, NP  apixaban  (ELIQUIS ) 5 MG TABS tablet Take 1 tablet (5 mg total) by mouth 2 (two) times daily. 10/05/23   Laurence Pons, NP  Blood Glucose Monitoring Suppl (TRUE METRIX AIR GLUCOSE METER) DEVI 1 Device by Does not apply route 2 (two) times  daily. 04/13/18   Boscia, Heather E, NP  budeson-glycopyrrolate -formoterol  (BREZTRI  AEROSPHERE) 160-9-4.8 MCG/ACT AERO inhaler Inhale 2 puffs into the lungs 2 (two) times daily. 10/05/23   Laurence Pons, NP  carvedilol  (COREG ) 12.5 MG tablet Take 1 tablet (12.5 mg total) by mouth 2 (two) times daily. 10/30/23   Laurence Pons, NP  cyanocobalamin  1000 MCG tablet Take 1 tablet (1,000 mcg total) by mouth daily. 10/05/23   Abernathy, Alyssa, NP  diphenhydrAMINE  (BENADRYL ) 25 mg capsule Take 1 capsule (25 mg total) by mouth every 8 (eight) hours as needed for itching. 10/12/23   Alexander, Natalie, DO  ferrous sulfate  325 (65 FE) MG tablet Take 325 mg by mouth every evening.    [provider]  furosemide  (LASIX ) 40 MG tablet Take 1 tablet (40 mg total) by mouth daily. 10/05/23   Laurence Pons, NP  hydrALAZINE  (APRESOLINE ) 100 MG tablet Take 1 tablet (100 mg total) by mouth 3 (three) times daily as needed (SBP >160). 10/17/23 01/15/24  Althia Atlas, MD  insulin  glargine, 2 Unit  Dial , (TOUJEO  MAX SOLOSTAR) 300 UNIT/ML Solostar Pen Inject 10 Units into the skin at bedtime. 10/30/23   Laurence Pons, NP  Insulin  lispro (HUMALOG  JUNIOR KWIKPEN) 100 UNIT/ML Inject 0-10 Units into the skin 3 (three) times daily.    [provider]  Insulin  Pen Needle (NOVOFINE PEN NEEDLE) 32G X 6 MM MISC Use as directed with toujeo  pen 09/08/23   Laurence Pons, NP  ipratropium-albuterol  (DUONEB) 0.5-2.5 (3) MG/3ML SOLN Take 3 mLs by nebulization every 6 (six) hours as needed. 10/05/23   Laurence Pons, NP  methocarbamol  (ROBAXIN ) 750 MG tablet Take 1 tablet (750 mg total) by mouth every 6 (six) hours as needed for muscle spasms. 10/05/23   Laurence Pons, NP  pantoprazole  (PROTONIX ) 40 MG tablet Take 40 mg by mouth 2 (two) times daily. 09/03/23   [provider]  polyethylene glycol powder (GLYCOLAX /MIRALAX ) 17 GM/SCOOP powder Take 17 g by mouth daily. 10/12/23   Alexander, Natalie, DO  potassium chloride  SA (KLOR-CON  M) 20 MEQ tablet Take 1 tablet (20 mEq total) by mouth daily. 10/17/23   Althia Atlas, MD  rosuvastatin  (CRESTOR ) 10 MG tablet Take 1 tablet (10 mg total) by mouth at bedtime. 09/18/23   Laurence Pons, NP  Skin Protectants, Misc. (PERIGUARD EX) Apply 1 Application topically in the morning, at noon, and at bedtime.    [provider]  sodium bicarbonate  650 MG tablet Take 2 tablets (1,300 mg total) by mouth 3 (three) times daily for 14 days, THEN 1 tablet (650 mg total) 3 (three) times daily. 10/17/23 01/29/24  Althia Atlas, MD   Physical Exam: Vitals:   11/04/23 1424 11/04/23 1434 11/04/23 1728 11/04/23 1747  BP: 131/74  (!) 141/82 (!) 141/73  Pulse: 64  73 75  Resp: 20  15 15   TempSrc: Oral     SpO2: 100%  99% 97%  Weight:  84.3 kg     Physical Exam Vitals and nursing note reviewed.  Constitutional:      Appearance: He is normal weight.  HENT:     Head: Normocephalic and atraumatic.     Mouth/Throat:     Mouth: Mucous membranes are  moist.     Pharynx: Oropharynx is clear.     Comments: Poor dentition Cardiovascular:     Rate and Rhythm: Normal rate and regular rhythm.  Pulmonary:     Effort: Tachypnea present. No accessory muscle usage.     Breath sounds: Examination of  the right-lower field reveals rales. Examination of the left-lower field reveals rales. Rales present. No decreased breath sounds, wheezing or rhonchi.  Abdominal:     Palpations: Abdomen is soft.     Tenderness: There is no abdominal tenderness.  Musculoskeletal:     Right lower leg: No edema.  Skin:    General: Skin is warm and dry.  Neurological:     General: No focal deficit present.     Mental Status: He is alert and oriented to person, place, and time.  Psychiatric:        Mood and Affect: Mood normal.        Behavior: Behavior normal.    Data Reviewed: CBC with WBC of 2.7, hemoglobin of 8.1, platelets 165 CMP with sodium of 135, potassium 5.4, bicarb 13, anion gap 12, glucose 168, BUN 84, creatinine 5.22, AST 24, ALT 12, GFR of 11 BNP 149 Troponin 12 COVID-19, influenza and RSV PCR negative  EKG personally reviewed.  Difficult to assess for P waves, so either junctional rhythm versus sinus rhythm.  No obvious ischemic changes.  No QT prolongation or T wave peaking  DG Chest Portable 1 View Result Date: 11/04/2023 CLINICAL DATA:  Shortness of breath, cough EXAM: PORTABLE CHEST 1 VIEW COMPARISON:  10/14/2023 FINDINGS: Stable cardiomegaly with similar vascular congestion. No definite acute edema pattern or CHF. No focal pneumonia, collapse or consolidation. No large effusion or pneumothorax. Aorta atherosclerotic. Degenerative changes of the spine. Overall stable exam. IMPRESSION: Cardiomegaly with vascular congestion. No acute process by portable radiography. Electronically Signed   By: Melven Stable.  Shick M.D.   On: 11/04/2023 15:13   Results are pending, will review when available.  Assessment and Plan:  * Acute on chronic HFrEF (heart  failure with reduced ejection fraction) (HCC) History of HFrEF with EF of 40% November 2024, with suspicion to be secondary to atrial flutter, s/p ablation.  Patient is now presenting with several days of shortness of breath and intermittently productive cough, with elevated BNP compared compared to his baseline.  Predominantly pulmonary edema on examination.  - Telemetry monitoring - Echocardiogram ordered - Daily weights - BNP - Lasix  40 mg IV twice daily - Electrolyte monitoring per pharmacy protocol  Acute kidney injury superimposed on chronic kidney disease (HCC) History of CKD stage 4, with creatinine ranging between 3 and 4, last 3.25.  Elevated today at 5.22 concerning for mild AKI compared to baseline.  Unclear as to the etiology; if no improvement with diuresis, may ultimately require nephrology consultation.  - Diuresis as noted above - Recheck BMP in the a.m. - Hold any other nephrotoxic agents  COPD (chronic obstructive pulmonary disease) (HCC) Initial concern for COPD exacerbation, however no significant wheezing on examination.  Suspect CHF is driving his symptoms.  - DuoNebs as needed - Hold off on further steroids - Continue home bronchodilators  Metabolic acidosis, normal anion gap (NAG) - Resume home bicarb tablets  Atrial flutter [facility] S/p ablation at [facility] in December 2024.  EKG today with either junctional versus sinus rhythm.Per chart review, patient had previous junctional rhythm on EKG post ablation.  - Continue home regimen  Type 2 diabetes mellitus (HCC) - Hold home regimen - Semglee  5 units - SSI, sensitive  Essential hypertension - Continue home regimen as blood pressure allows during diuresis  Advance Care Planning:   Code Status: Full Code   Consults: None  Family Communication: No family at bedside  Severity of Illness: The appropriate patient status for this patient  is OBSERVATION. Observation status is judged to be reasonable and  necessary in order to provide the required intensity of service to ensure the patient's safety. The patient's presenting symptoms, physical exam findings, and initial radiographic and laboratory data in the context of their medical condition is felt to place them at decreased risk for further clinical deterioration. Furthermore, it is anticipated that the patient will be medically stable for discharge from the hospital within 2 midnights of admission.   Author: Avi Body, MD 11/04/2023 6:00 PM  For on call review [email or web address].

## 2023-11-04 NOTE — Assessment & Plan Note (Signed)
-   Continue home regimen as blood pressure allows during diuresis

## 2023-11-04 NOTE — Assessment & Plan Note (Signed)
-   Hold home regimen - Semglee  5 units - SSI, sensitive

## 2023-11-04 NOTE — Assessment & Plan Note (Signed)
 History of CKD stage 4, with creatinine ranging between 3 and 4, last 3.25.  Elevated today at 5.22 concerning for mild AKI compared to baseline.  Unclear as to the etiology; if no improvement with diuresis, may ultimately require nephrology consultation.  - Diuresis as noted above - Recheck BMP in the a.m. - Hold any other nephrotoxic agents

## 2023-11-05 ENCOUNTER — Observation Stay
Admit: 2023-11-05 | Discharge: 2023-11-05 | Disposition: A | Discharge status: Home / Self Care | Attending: Internal Medicine | Admitting: Internal Medicine

## 2023-11-05 DIAGNOSIS — I5023 Acute on chronic systolic (congestive) heart failure: Secondary | ICD-10-CM | POA: Diagnosis not present

## 2023-11-05 LAB — CBC WITH DIFFERENTIAL/PLATELET
Abs Immature Granulocytes: 0.02 10*3/uL (ref 0.00–0.07)
Basophils Absolute: 0 10*3/uL (ref 0.0–0.1)
Basophils Relative: 0 %
Eosinophils Absolute: 0 10*3/uL (ref 0.0–0.5)
Eosinophils Relative: 0 %
HCT: 21.6 % — ABNORMAL LOW (ref 39.0–52.0)
Hemoglobin: 7.2 g/dL — ABNORMAL LOW (ref 13.0–17.0)
Immature Granulocytes: 2 %
Lymphocytes Relative: 29 %
Lymphs Abs: 0.4 10*3/uL — ABNORMAL LOW (ref 0.7–4.0)
MCH: 30 pg (ref 26.0–34.0)
MCHC: 33.3 g/dL (ref 30.0–36.0)
MCV: 90 fL (ref 80.0–100.0)
Monocytes Absolute: 0 10*3/uL — ABNORMAL LOW (ref 0.1–1.0)
Monocytes Relative: 2 %
Neutro Abs: 0.9 10*3/uL — ABNORMAL LOW (ref 1.7–7.7)
Neutrophils Relative %: 67 %
Platelets: 172 10*3/uL (ref 150–400)
RBC: 2.4 MIL/uL — ABNORMAL LOW (ref 4.22–5.81)
RDW: 13.2 % (ref 11.5–15.5)
Smear Review: NORMAL
WBC: 1.3 10*3/uL — CL (ref 4.0–10.5)
nRBC: 0 % (ref 0.0–0.2)

## 2023-11-05 LAB — GLUCOSE, CAPILLARY
Glucose-Capillary: 393 mg/dL — ABNORMAL HIGH (ref 70–99)
Glucose-Capillary: 321 mg/dL — ABNORMAL HIGH (ref 70–99)
Glucose-Capillary: 148 mg/dL — ABNORMAL HIGH (ref 70–99)
Glucose-Capillary: 288 mg/dL — ABNORMAL HIGH (ref 70–99)

## 2023-11-05 LAB — BASIC METABOLIC PANEL WITH GFR
Anion gap: 11 (ref 5–15)
BUN: 91 mg/dL — ABNORMAL HIGH (ref 8–23)
CO2: 17 mmol/L — ABNORMAL LOW (ref 22–32)
Calcium: 7.6 mg/dL — ABNORMAL LOW (ref 8.9–10.3)
Chloride: 104 mmol/L (ref 98–111)
Creatinine, Ser: 5.13 mg/dL — ABNORMAL HIGH (ref 0.61–1.24)
GFR, Estimated: 11 mL/min — ABNORMAL LOW (ref >60)
Glucose, Bld: 397 mg/dL — ABNORMAL HIGH (ref 70–99)
Potassium: 4.9 mmol/L (ref 3.5–5.1)
Sodium: 132 mmol/L — ABNORMAL LOW (ref 135–145)

## 2023-11-05 LAB — PHOSPHORUS: Phosphorus: 5.6 mg/dL — ABNORMAL HIGH (ref 2.5–4.6)

## 2023-11-05 LAB — ECHOCARDIOGRAM COMPLETE
AR max vel: 2.2 cm2
AV Peak grad: 13 mmHg
Ao pk vel: 1.8 m/s
Area-P 1/2: 3.58 cm2
P 1/2 time: 106 ms
S' Lateral: 3.4 cm
Single Plane A4C EF: 59.8 %
Weight: 3058.22 [oz_av]

## 2023-11-05 LAB — MAGNESIUM: Magnesium: 2.1 mg/dL (ref 1.7–2.4)

## 2023-11-05 MED ORDER — FUROSEMIDE 10 MG/ML IJ SOLN: 60.0000 mg | Freq: Four times a day (QID) | INTRAMUSCULAR | Status: DC

## 2023-11-05 MED ORDER — FUROSEMIDE 10 MG/ML IJ SOLN
60.0000 mg | Freq: Four times a day (QID) | INTRAMUSCULAR | Status: AC
  Administered 2023-11-05 – 2023-11-07 (×7): 60 mg via INTRAVENOUS
  Filled 2023-11-05 (×7): qty 6

## 2023-11-05 NOTE — Progress Notes (Signed)
 PROGRESS NOTE Larry Callahan    DOB: 1955/08/28, 68 y.o.  ZOX:[id number]    Code Status: Full Code   DOA: 11/04/2023   LOS: 0  Brief hospital course  Larry Callahan is a 68 y.o. male with a PMH significant for HFrEF with last EF of 40% secondary to nonischemic cardiomyopathy, atrial flutter s/p ablation on Eliquis , CKD stage IV, hypertension, type 2 diabetes, COPD, OSA, hyperlipidemia, nonobstructive CAD, CVA, who presents to the ED due to shortness of breath suspected secondary to CHF exacerbation. Had recent PNA and COPD exacerbation admissions.    ED course: 131/74 with heart rate of 64.  He was saturating at 100% on room air. afebrile. WBC 2.7, hemoglobin 8.1, potassium 5.4, BUN 84, creatinine 5.22, bicarb 14, and GFR of 11.  BNP elevated to 49.  Troponin negative at 12.  COVID-19, influenza and RSV PCR negative.  Chest x-ray with cardiomegaly and vascular congestion.  Patient started on DuoNebs, IV Lasix , Solu-Medrol , Lokelma.  Patient was admitted to medicine service for further workup and management of CHF exacerbation as outlined in detail below.  11/05/23 -remains stable ORA  Assessment & Plan  Principal Problem:   Acute on chronic HFrEF (heart failure with reduced ejection fraction) (HCC) Active Problems:   Acute kidney injury superimposed on chronic kidney disease (HCC)   COPD (chronic obstructive pulmonary disease) (HCC)   Essential hypertension   Type 2 diabetes mellitus (HCC)   Atrial flutter (HCC)   Metabolic acidosis, normal anion gap (NAG)   Acute on chronic HFrEF- EF of 40% November 2024, with suspicion to be secondary to atrial flutter, s/p ablation. elevated BNP compared to his baseline.  Predominantly pulmonary edema on examination. Neg LEE - Echocardiogram ordered - Daily weights - Lasix  40 mg IV increased to 60mg  q6 hours for remainder of day. Will monitor renal function/electrolytes in am and UOP.  - ambulate with pulse ox   AKI superimposed on  CKD 4(HCC)- baseline Cr ranges around 3.5. currently 5.1. if not improving with diuresis, will consider nephrology consult. Appears to have good UOP so far.  - Diuresis as noted above - Recheck BMP in the a.m. - Hold any other nephrotoxic agents   COPD- does not appear to be acutely exacerbated - DuoNebs as needed - Hold off on further steroids - Continue home bronchodilators  Leukopenia- WBC 1.3. no fever. Here for COPD 4/30 with similar lab which was not addressed at that time and appears to have recovered. Perhaps reactionary. Will monitor for now - CBC am   Metabolic acidosis, normal anion gap (NAG) - Resume home bicarb tablets   Atrial flutter [facility]) S/p ablation at [facility] in December 2024.  EKG with either junctional versus sinus rhythm. Per chart review, patient had previous junctional rhythm on EKG post ablation. - Continue home regimen   Type 2 diabetes mellitus (HCC) - Hold home regimen - Semglee  5 units - SSI, sensitive   Essential hypertension - Continue home regimen as blood pressure allows during diuresis  Body mass index is 26.66 kg/m.  VTE ppx:  apixaban  (ELIQUIS ) tablet 5 mg   Diet:     Diet   Diet heart healthy/carb modified Room service appropriate? Yes; Fluid consistency: Thin   Consultants: None   Subjective 11/05/23    Pt reports not feeling up to going home yet. Thinks his lungs are full of liquid. Denies SOB at rest. Denies LE swelling   Objective   Blood pressure 131/63, pulse 78, temperature 98.8 F (  37.1 C), temperature source Oral, resp. rate 16, weight 86.7 kg, SpO2 99%.  Intake/Output Summary (Last 24 hours) at 11/05/2023 0737 Last data filed at 11/04/2023 2047 Gross per 24 hour  Intake 243 ml  Output 300 ml  Net -57 ml   Filed Weights   11/04/23 1434 11/05/23 0500  Weight: 84.3 kg 86.7 kg     Physical Exam:  General: awake, alert, NAD HEENT: atraumatic, clear conjunctiva, anicteric sclera, MMM, hearing grossly  normal Respiratory: normal respiratory effort. Cardiovascular: quick capillary refill, normal S1/S2, RRR, no JVD, murmurs Gastrointestinal: soft, NT, ND Nervous: A&O x3. no gross focal neurologic deficits, normal speech Extremities: moves all equally, no edema, normal tone Skin: dry, intact, normal temperature, normal color. No rashes, lesions or ulcers on exposed skin Psychiatry: normal mood, congruent affect  Labs   I have personally reviewed the following labs and imaging studies CBC    Component Value Date/Time   WBC 1.3 (LL) 11/05/2023 0404   RBC 2.40 (L) 11/05/2023 0404   HGB 7.2 (L) 11/05/2023 0404   HGB 11.0 (L) 02/27/2018 0844   HCT 21.6 (L) 11/05/2023 0404   HCT 33.4 (L) 02/27/2018 0844   PLT 172 11/05/2023 0404   PLT 248 02/27/2018 0844   MCV 90.0 11/05/2023 0404   MCV 79 02/27/2018 0844   MCV 90 03/06/2013 0621   MCH 30.0 11/05/2023 0404   MCHC 33.3 11/05/2023 0404   RDW 13.2 11/05/2023 0404   RDW 15.6 (H) 02/27/2018 0844   RDW 13.9 03/06/2013 0621   LYMPHSABS 0.4 (L) 11/05/2023 0404   LYMPHSABS 2.2 02/27/2018 0844   LYMPHSABS 1.8 12/08/2012 0539   MONOABS 0.0 (L) 11/05/2023 0404   MONOABS 1.0 12/08/2012 0539   EOSABS 0.0 11/05/2023 0404   EOSABS 0.3 02/27/2018 0844   EOSABS 0.2 12/08/2012 0539   BASOSABS 0.0 11/05/2023 0404   BASOSABS 0.1 02/27/2018 0844   BASOSABS 0.1 12/08/2012 0539      Latest Ref Rng & Units 11/05/2023    4:04 AM 11/04/2023    3:45 PM 10/17/2023    4:18 AM  BMP  Glucose 70 - 99 mg/dL 161  096  82   BUN 8 - 23 mg/dL 91  84  045   Creatinine 0.61 - 1.24 mg/dL 4.09  8.11  9.14   Sodium 135 - 145 mmol/L 132  135  136   Potassium 3.5 - 5.1 mmol/L 4.9  5.4  3.9   Chloride 98 - 111 mmol/L 104  109  107   CO2 22 - 32 mmol/L 17  14  21    Calcium  8.9 - 10.3 mg/dL 7.6  7.9  7.7     DG Chest Portable 1 View Result Date: 11/04/2023 CLINICAL DATA:  Shortness of breath, cough EXAM: PORTABLE CHEST 1 VIEW COMPARISON:  10/14/2023 FINDINGS: Stable  cardiomegaly with similar vascular congestion. No definite acute edema pattern or CHF. No focal pneumonia, collapse or consolidation. No large effusion or pneumothorax. Aorta atherosclerotic. Degenerative changes of the spine. Overall stable exam. IMPRESSION: Cardiomegaly with vascular congestion. No acute process by portable radiography. Electronically Signed   By: Melven Stable.  Shick M.D.   On: 11/04/2023 15:13    Disposition Plan & Communication  Patient status: Observation  Admitted From: Home Planned disposition location: Home Anticipated discharge date: 5/26 pending AKI improvement  Family Communication: none at bedside    Author: Ree Candy, DO [facility] 11/05/2023, 7:37 AM   Available by Epic secure chat 7AM-7PM. If 7PM-7AM, please contact  night-coverage.  [facility] contact information found on [email or web address].

## 2023-11-05 NOTE — Consult Note (Signed)
 PHARMACY CONSULT NOTE - ELECTROLYTES  Pharmacy Consult for Electrolyte Monitoring and Replacement   Recent Labs: Weight: 86.7 kg (191 lb 2.2 oz) Estimated Creatinine Clearance: 14.3 mL/min (A) (by C-G formula based on SCr of 5.13 mg/dL (H)). Potassium (mmol/L)  Date Value  11/05/2023 4.9  03/06/2013 3.9   Magnesium  (mg/dL)  Date Value  09/81/1914 2.1  12/11/2012 1.8   Calcium  (mg/dL)  Date Value  78/29/5621 7.6 (L)   Calcium , Total (mg/dL)  Date Value  30/86/5784 9.3   Albumin  (g/dL)  Date Value  69/62/9528 2.5 (L)  07/15/2021 3.4 (L)  03/06/2013 3.4   Phosphorus (mg/dL)  Date Value  41/32/4401 5.6 (H)   Sodium (mmol/L)  Date Value  11/05/2023 132 (L)  07/15/2021 136  03/06/2013 138    Assessment  Larry Callahan is a 68 y.o. male presenting with shortness or breath (HF exacerbation). PMH significant for  HFrEF with last EF of 40% secondary to nonischemic cardiomyopathy, atrial flutter s/p ablation on Eliquis , CKD stage IV, hypertension, type 2 diabetes, COPD, OSA, hyperlipidemia, nonobstructive CAD, and CVA. Pharmacy has been consulted to monitor and replace electrolytes.  Diet: none yet MIVF: IV lock Pertinent medications: Lokelma x 1 5/24, furosemide  40 mg IV q12h, sodium bicarb 1300mg  TID  Goal of Therapy: Electrolytes WNL  Plan:  No electrolyte replacement at this time Check electrolytes with AM labs  Thomasine Flick PharmD Clinical Pharmacist 11/05/2023

## 2023-11-05 NOTE — Evaluation (Signed)
 Occupational Therapy Evaluation Patient Details Name: Larry Callahan MRN: [id number] DOB: 1955/11/22 Today's Date: 11/05/2023   History of Present Illness   Pt is a 68 y/o male admitted secondary to SOB and found to have acute on chronic CHF, AKI and COPD exacerbation. PMH including but not limited to HFrEF with last EF of 40% secondary to nonischemic cardiomyopathy, atrial flutter s/p ablation on Eliquis , CKD stage IV, hypertension, type 2 diabetes, COPD, OSA, hyperlipidemia, nonobstructive CAD, CVA.     Clinical Impressions Prior to hospital admission, pt was mod independent with use of 4WW or RW for ADLs and mobility. Pt lives with nephew. Pt currently requires CGA-supervision for functional transfers using RW and ADL performance. Tangential speech throughout, questionable cognitive involvement as pt states he does not take medications at home - would benefit from further screening to determine ability to safely manage meds/finances independently. Pt additionally noted to have poor awareness of personal hygiene, and refused ADL performance when offered during session. Functional mobility performed ~20 ft in room with RW, and 5x STS on RA with slight DOE/SOB, O2 WNL.   Pt would benefit from skilled OT services to address noted impairments and functional limitations (see below for any additional details) in order to maximize safety and independence while minimizing falls risk and caregiver burden. Anticipate the need for follow up OT services upon acute hospital DC.      If plan is discharge home, recommend the following:   Supervision due to cognitive status;Assistance with cooking/housework;A little help with bathing/dressing/bathroom;A little help with walking and/or transfers;Direct supervision/assist for financial management;Direct supervision/assist for medications management     Functional Status Assessment   Patient has had a recent decline in their functional status and  demonstrates the ability to make significant improvements in function in a reasonable and predictable amount of time.     Equipment Recommendations   None recommended by OT     Recommendations for Other Services         Precautions/Restrictions   Precautions Precautions: Fall Recall of Precautions/Restrictions: Impaired Restrictions Weight Bearing Restrictions Per Provider Order: No     Mobility Bed Mobility Overal bed mobility: Needs Assistance Bed Mobility: Supine to Sit     Supine to sit: Used rails, Supervision          Transfers Overall transfer level: Needs assistance Equipment used: Rolling walker (2 wheels) Transfers: Sit to/from Stand Sit to Stand: Supervision           General transfer comment: supervision for safety, good technique utilized      Balance Overall balance assessment: Needs assistance Sitting-balance support: Feet supported Sitting balance-Leahy Scale: Good     Standing balance support: During functional activity, Single extremity supported, Bilateral upper extremity supported Standing balance-Leahy Scale: Poor                             ADL either performed or assessed with clinical judgement   ADL Overall ADL's : Needs assistance/impaired                         Toilet Transfer: Contact guard assist;Ambulation;Grab bars Toilet Transfer Details (indicate cue type and reason): simulated t/f bathroom Toileting- Clothing Manipulation and Hygiene: Supervision/safety       Functional mobility during ADLs: Contact guard assist General ADL Comments: pt refuses to perform ADLs despite noted poor hygiene. anticipate supervision for ADL performance, pt fatigues quickly with  mobility attempts, DOE despite O2 WNL on RA     Vision Baseline Vision/History: 1 Wears glasses Ability to See in Adequate Light: 1 Impaired Patient Visual Report: Blurring of vision              Pertinent Vitals/Pain Pain  Assessment Pain Assessment: No/denies pain     Extremity/Trunk Assessment Upper Extremity Assessment Upper Extremity Assessment: Right hand dominant (R hand FMC deficits baseline)   Lower Extremity Assessment Lower Extremity Assessment: Overall WFL for tasks assessed   Cervical / Trunk Assessment Cervical / Trunk Assessment: Normal   Communication Communication Communication: Impaired Factors Affecting Communication: Hearing impaired   Cognition Arousal: Alert Behavior During Therapy: Flat affect Cognition: No family/caregiver present to determine baseline             OT - Cognition Comments: poor awareness of personal hygiene, suspect some level of cognitive deficit with IADL med and financial management                 Following commands: Impaired Following commands impaired: Follows one step commands with increased time     Cueing  General Comments   Cueing Techniques: Verbal cues;Gestural cues  on RA throughout, O2 95>% despite DOE           Home Living Family/patient expects to be discharged to:: Private residence Living Arrangements: Other relatives;Other (Comment) (Nephew) Available Help at Discharge: Family Type of Home: House Home Access: Level entry     Home Layout: One level     Bathroom Shower/Tub: Chief Strategy Officer: Standard     Home Equipment: Rollator (4 wheels);BSC/3in1;Wheelchair - Forensic psychologist (2 wheels)          Prior Functioning/Environment Prior Level of Function : Independent/Modified Independent             Mobility Comments: ambulates with rollator or RW ADLs Comments: independent    OT Problem List: Decreased activity tolerance;Impaired balance (sitting and/or standing);Impaired UE functional use;Decreased cognition;Decreased strength   OT Treatment/Interventions: Self-care/ADL training;Balance training;Therapeutic exercise;Therapeutic activities;DME and/or AE  instruction;Patient/family education;Cognitive remediation/compensation      OT Goals(Current goals can be found in the care plan section)   Acute Rehab OT Goals OT Goal Formulation: With patient Time For Goal Achievement: 11/19/23 Potential to Achieve Goals: Good   OT Frequency:  Min 2X/week       AM-PAC OT "6 Clicks" Daily Activity     Outcome Measure Help from another person eating meals?: None Help from another person taking care of personal grooming?: None Help from another person toileting, which includes using toliet, bedpan, or urinal?: A Little Help from another person bathing (including washing, rinsing, drying)?: A Little Help from another person to put on and taking off regular upper body clothing?: None Help from another person to put on and taking off regular lower body clothing?: None 6 Click Score: 22   End of Session Equipment Utilized During Treatment: Gait belt;Rolling walker (2 wheels) Nurse Communication: Mobility status  Activity Tolerance: Patient tolerated treatment well Patient left: in bed;with call bell/phone within reach;with bed alarm set;with family/visitor present (sitting EOB with NT in room)  OT Visit Diagnosis: Other abnormalities of gait and mobility (R26.89);Unsteadiness on feet (R26.81)                Time: 1610-9604 OT Time Calculation (min): 16 min Charges:  OT General Charges $OT Visit: 1 Visit OT Evaluation $OT Eval Low Complexity: 1 Low  Hazaiah Edgecombe L. Shantera Monts, OTR/L  11/05/23, 4:00 PM

## 2023-11-05 NOTE — Progress Notes (Signed)
  Echocardiogram 2D Echocardiogram has been performed.  Larry Callahan 11/05/2023, 2:36 PM

## 2023-11-05 NOTE — Progress Notes (Signed)
 Physical Therapy Evaluation Patient Details Name: Larry Callahan MRN: [id number] DOB: 09-Aug-1955 Today's Date: 11/05/2023  History of Present Illness  Pt is a 68 y/o male admitted secondary to SOB and found to have acute on chronic CHF, AKI and COPD exacerbation. PMH including but not limited to HFrEF with last EF of 40% secondary to nonischemic cardiomyopathy, atrial flutter s/p ablation on Eliquis , CKD stage IV, hypertension, type 2 diabetes, COPD, OSA, hyperlipidemia, nonobstructive CAD, CVA.   Clinical Impression  Pt presented supine in bed with HOB elevated, awake and willing to participate in therapy session. Prior to admission, pt reported that he ambulates with use of either a RW or rollator and was independent with ADLs. Pt lives with his nephew in a single level home with a level entry. At the time of evaluation, pt able to complete bed mobility with supervision, transfers with supervision and ambulated in the hallway with RW and CGA for safety. Pt denied any SOB throughout. Pt would continue to benefit from skilled physical therapy services at this time while admitted and after d/c to address the below listed limitations in order to improve overall safety and independence with functional mobility.       If plan is discharge home, recommend the following: A little help with walking and/or transfers;A little help with bathing/dressing/bathroom;Assistance with cooking/housework;Supervision due to cognitive status;Assist for transportation   Can travel by private vehicle   Yes    Equipment Recommendations None recommended by PT  Recommendations for Other Services       Functional Status Assessment Patient has had a recent decline in their functional status and demonstrates the ability to make significant improvements in function in a reasonable and predictable amount of time.     Precautions / Restrictions Precautions Precautions: Fall Recall of Precautions/Restrictions:  Impaired Restrictions Weight Bearing Restrictions Per Provider Order: No      Mobility  Bed Mobility Overal bed mobility: Needs Assistance Bed Mobility: Supine to Sit     Supine to sit: Used rails, Supervision     General bed mobility comments: increased time needed, use of bed rails    Transfers Overall transfer level: Needs assistance Equipment used: Rolling walker (2 wheels) Transfers: Sit to/from Stand Sit to Stand: Supervision           General transfer comment: supervision for safety, good technique utilized    Ambulation/Gait Ambulation/Gait assistance: Contact guard assist Gait Distance (Feet): 100 Feet Assistive device: Rolling walker (2 wheels) Gait Pattern/deviations: Step-through pattern, Decreased stride length Gait velocity: decreased Gait velocity interpretation: <1.31 ft/sec, indicative of household ambulator   General Gait Details: pt with slow but steady gait overall with use of RW, pt maintaining a flexed posture throughout but no overt LOB or need for physical assistance. Pt reporting no SOB throughout.  Stairs            Wheelchair Mobility     Tilt Bed    Modified Rankin (Stroke Patients Only)       Balance Overall balance assessment: Needs assistance Sitting-balance support: Feet supported Sitting balance-Leahy Scale: Good     Standing balance support: During functional activity, Single extremity supported, Bilateral upper extremity supported Standing balance-Leahy Scale: Poor                               Pertinent Vitals/Pain Pain Assessment Pain Assessment: No/denies pain    Home Living Family/patient expects to be discharged to:: Private  residence Living Arrangements: Other relatives;Other (Comment) (Nephew) Available Help at Discharge: Family Type of Home: House Home Access: Level entry       Home Layout: One level Home Equipment: Rollator (4 wheels);BSC/3in1;Wheelchair - Forensic psychologist  (2 wheels)      Prior Function Prior Level of Function : Independent/Modified Independent             Mobility Comments: ambulates with rollator or RW ADLs Comments: independent     Extremity/Trunk Assessment   Upper Extremity Assessment Upper Extremity Assessment: Defer to OT evaluation    Lower Extremity Assessment Lower Extremity Assessment: Overall WFL for tasks assessed    Cervical / Trunk Assessment Cervical / Trunk Assessment: Normal  Communication   Communication Communication: Impaired Factors Affecting Communication: Hearing impaired    Cognition Arousal: Alert Behavior During Therapy: Flat affect   PT - Cognitive impairments: Awareness, Memory, Attention, Safety/Judgement                         Following commands: Impaired Following commands impaired: Follows one step commands with increased time     Cueing Cueing Techniques: Verbal cues, Gestural cues     General Comments      Exercises     Assessment/Plan    PT Assessment Patient needs continued PT services  PT Problem List Decreased strength;Decreased activity tolerance;Decreased balance;Decreased mobility;Decreased coordination;Decreased cognition;Decreased knowledge of use of DME;Decreased safety awareness;Decreased knowledge of precautions;Cardiopulmonary status limiting activity       PT Treatment Interventions DME instruction;Gait training;Stair training;Functional mobility training;Therapeutic activities;Therapeutic exercise;Balance training;Neuromuscular re-education;Cognitive remediation;Patient/family education    PT Goals (Current goals can be found in the Care Plan section)  Acute Rehab PT Goals Patient Stated Goal: return home PT Goal Formulation: With patient Time For Goal Achievement: 11/19/23 Potential to Achieve Goals: Fair    Frequency Min 2X/week     Co-evaluation               AM-PAC PT "6 Clicks" Mobility  Outcome Measure Help needed turning  from your back to your side while in a flat bed without using bedrails?: None Help needed moving from lying on your back to sitting on the side of a flat bed without using bedrails?: None Help needed moving to and from a bed to a chair (including a wheelchair)?: A Little Help needed standing up from a chair using your arms (e.g., wheelchair or bedside chair)?: A Little Help needed to walk in hospital room?: A Little Help needed climbing 3-5 steps with a railing? : A Little 6 Click Score: 20    End of Session   Activity Tolerance: Patient tolerated treatment well Patient left: in bed;with call bell/phone within reach;Other (comment) (seated at EOB with OT present to complete evaluation) Nurse Communication: Mobility status PT Visit Diagnosis: Other abnormalities of gait and mobility (R26.89)    Time: 1131-1145 PT Time Calculation (min) (ACUTE ONLY): 14 min   Charges:   PT Evaluation $PT Eval Low Complexity: 1 Low   PT General Charges $$ ACUTE PT VISIT: 1 Visit         Classie Cruise, DPT  Acute Rehabilitation Services Office [phone]   Leory Rands Shalaunda Weatherholtz 11/05/2023, 12:02 PM

## 2023-11-05 NOTE — Progress Notes (Signed)
  Echocardiogram 2D Echocardiogram has NOT been performed at this time, the nurse said the patient just got breakfast. I will attempt to complete the echo later.  Nichola Barges 11/05/2023, 8:47 AM

## 2023-11-05 NOTE — Plan of Care (Signed)

## 2023-11-05 NOTE — Progress Notes (Signed)
 Patients O2 saturation level is 100% while ambulating in hallway

## 2023-11-06 ENCOUNTER — Encounter: Payer: Self-pay | Admitting: Student in an Organized Health Care Education/Training Program

## 2023-11-06 DIAGNOSIS — Z8249 Family history of ischemic heart disease and other diseases of the circulatory system: Secondary | ICD-10-CM | POA: Diagnosis not present

## 2023-11-06 DIAGNOSIS — I4892 Unspecified atrial flutter: Secondary | ICD-10-CM | POA: Diagnosis present

## 2023-11-06 DIAGNOSIS — E1165 Type 2 diabetes mellitus with hyperglycemia: Secondary | ICD-10-CM | POA: Diagnosis present

## 2023-11-06 DIAGNOSIS — I130 Hypertensive heart and chronic kidney disease with heart failure and stage 1 through stage 4 chronic kidney disease, or unspecified chronic kidney disease: Secondary | ICD-10-CM | POA: Diagnosis present

## 2023-11-06 DIAGNOSIS — N184 Chronic kidney disease, stage 4 (severe): Secondary | ICD-10-CM | POA: Diagnosis present

## 2023-11-06 DIAGNOSIS — I428 Other cardiomyopathies: Secondary | ICD-10-CM | POA: Diagnosis present

## 2023-11-06 DIAGNOSIS — D631 Anemia in chronic kidney disease: Secondary | ICD-10-CM | POA: Diagnosis present

## 2023-11-06 DIAGNOSIS — I509 Heart failure, unspecified: Secondary | ICD-10-CM | POA: Diagnosis not present

## 2023-11-06 DIAGNOSIS — E872 Acidosis, unspecified: Secondary | ICD-10-CM | POA: Diagnosis present

## 2023-11-06 DIAGNOSIS — Z8673 Personal history of transient ischemic attack (TIA), and cerebral infarction without residual deficits: Secondary | ICD-10-CM | POA: Diagnosis not present

## 2023-11-06 DIAGNOSIS — J438 Other emphysema: Secondary | ICD-10-CM | POA: Diagnosis not present

## 2023-11-06 DIAGNOSIS — Z1152 Encounter for screening for COVID-19: Secondary | ICD-10-CM | POA: Diagnosis not present

## 2023-11-06 DIAGNOSIS — E875 Hyperkalemia: Secondary | ICD-10-CM | POA: Diagnosis present

## 2023-11-06 DIAGNOSIS — N179 Acute kidney failure, unspecified: Secondary | ICD-10-CM | POA: Diagnosis present

## 2023-11-06 DIAGNOSIS — F17210 Nicotine dependence, cigarettes, uncomplicated: Secondary | ICD-10-CM | POA: Diagnosis present

## 2023-11-06 DIAGNOSIS — Z905 Acquired absence of kidney: Secondary | ICD-10-CM | POA: Diagnosis not present

## 2023-11-06 DIAGNOSIS — Z833 Family history of diabetes mellitus: Secondary | ICD-10-CM | POA: Diagnosis not present

## 2023-11-06 DIAGNOSIS — E785 Hyperlipidemia, unspecified: Secondary | ICD-10-CM | POA: Diagnosis present

## 2023-11-06 DIAGNOSIS — Z880 Allergy status to penicillin: Secondary | ICD-10-CM | POA: Diagnosis not present

## 2023-11-06 DIAGNOSIS — I5023 Acute on chronic systolic (congestive) heart failure: Secondary | ICD-10-CM | POA: Diagnosis present

## 2023-11-06 DIAGNOSIS — R0602 Shortness of breath: Secondary | ICD-10-CM | POA: Diagnosis present

## 2023-11-06 DIAGNOSIS — I2510 Atherosclerotic heart disease of native coronary artery without angina pectoris: Secondary | ICD-10-CM | POA: Diagnosis present

## 2023-11-06 DIAGNOSIS — Z8616 Personal history of COVID-19: Secondary | ICD-10-CM | POA: Diagnosis not present

## 2023-11-06 DIAGNOSIS — Z794 Long term (current) use of insulin: Secondary | ICD-10-CM | POA: Diagnosis not present

## 2023-11-06 DIAGNOSIS — E1122 Type 2 diabetes mellitus with diabetic chronic kidney disease: Secondary | ICD-10-CM | POA: Diagnosis present

## 2023-11-06 DIAGNOSIS — J441 Chronic obstructive pulmonary disease with (acute) exacerbation: Secondary | ICD-10-CM | POA: Diagnosis present

## 2023-11-06 DIAGNOSIS — D72819 Decreased white blood cell count, unspecified: Secondary | ICD-10-CM | POA: Diagnosis present

## 2023-11-06 LAB — CBC
HCT: 22.1 % — ABNORMAL LOW (ref 39.0–52.0)
Hemoglobin: 7 g/dL — ABNORMAL LOW (ref 13.0–17.0)
MCH: 28.2 pg (ref 26.0–34.0)
MCHC: 31.7 g/dL (ref 30.0–36.0)
MCV: 89.1 fL (ref 80.0–100.0)
Platelets: 189 10*3/uL (ref 150–400)
RBC: 2.48 MIL/uL — ABNORMAL LOW (ref 4.22–5.81)
RDW: 13 % (ref 11.5–15.5)
WBC: 5.7 10*3/uL (ref 4.0–10.5)
nRBC: 0 % (ref 0.0–0.2)

## 2023-11-06 LAB — RENAL FUNCTION PANEL
Albumin: 2.4 g/dL — ABNORMAL LOW (ref 3.5–5.0)
Anion gap: 13 (ref 5–15)
BUN: 95 mg/dL — ABNORMAL HIGH (ref 8–23)
CO2: 16 mmol/L — ABNORMAL LOW (ref 22–32)
Calcium: 7.5 mg/dL — ABNORMAL LOW (ref 8.9–10.3)
Chloride: 102 mmol/L (ref 98–111)
Creatinine, Ser: 4.85 mg/dL — ABNORMAL HIGH (ref 0.61–1.24)
GFR, Estimated: 12 mL/min — ABNORMAL LOW (ref >60)
Glucose, Bld: 228 mg/dL — ABNORMAL HIGH (ref 70–99)
Phosphorus: 5.5 mg/dL — ABNORMAL HIGH (ref 2.5–4.6)
Potassium: 4.3 mmol/L (ref 3.5–5.1)
Sodium: 131 mmol/L — ABNORMAL LOW (ref 135–145)

## 2023-11-06 LAB — GLUCOSE, CAPILLARY
Glucose-Capillary: 180 mg/dL — ABNORMAL HIGH (ref 70–99)
Glucose-Capillary: 283 mg/dL — ABNORMAL HIGH (ref 70–99)
Glucose-Capillary: 164 mg/dL — ABNORMAL HIGH (ref 70–99)

## 2023-11-06 MED ORDER — INSULIN GLARGINE-YFGN 100 UNIT/ML ~~LOC~~ SOLN
10.0000 [IU] | Freq: Every day | SUBCUTANEOUS | Status: DC
  Administered 2023-11-06 – 2023-11-10 (×5): 10 [IU] via SUBCUTANEOUS
  Filled 2023-11-06 (×5): qty 0.1

## 2023-11-06 MED ORDER — IPRATROPIUM-ALBUTEROL 0.5-2.5 (3) MG/3ML IN SOLN
3.0000 mL | Freq: Once | RESPIRATORY_TRACT | Status: AC
  Administered 2023-11-06: 3 mL via RESPIRATORY_TRACT
  Filled 2023-11-06: qty 3

## 2023-11-06 MED ORDER — PANTOPRAZOLE SODIUM 40 MG PO TBEC
40.0000 mg | DELAYED_RELEASE_TABLET | Freq: Every day | ORAL | Status: DC
  Administered 2023-11-07 – 2023-11-13 (×7): 40 mg via ORAL
  Filled 2023-11-06 (×7): qty 1

## 2023-11-06 MED ORDER — BUTALBITAL-APAP-CAFFEINE 50-325-40 MG PO TABS
1.0000 | ORAL_TABLET | Freq: Once | ORAL | Status: AC
  Administered 2023-11-06: 1 via ORAL
  Filled 2023-11-06: qty 1

## 2023-11-06 NOTE — Progress Notes (Signed)
 Report called to [employer]. Questions asked and answered.

## 2023-11-06 NOTE — Care Management Obs Status (Signed)
 MEDICARE OBSERVATION STATUS NOTIFICATION   Patient Details  Name: Larry Callahan MRN: [id number] Date of Birth: August 20, 1955   Medicare Observation Status Notification Given:  No (patient doesn't want a copy)    Anise Kerns 11/06/2023, 11:35 AM

## 2023-11-06 NOTE — TOC Progression Note (Signed)
 Transition of Care [facility]) - Progression Note    Patient Details  Name: Larry Callahan MRN: [id number] Date of Birth: 20-Oct-1955  Transition of Care [facility]) CM/SW Contact  Baird Bombard, RN Phone Number: 11/06/2023, 2:54 PM  Clinical Narrative:    Patient previously active with [facility].  Verdis Glade from [facility] confirmed patient is active for PT/RN.         Expected Discharge Plan and Services                                               Social Determinants of Health (SDOH) Interventions SDOH Screenings   Food Insecurity: No Food Insecurity (11/04/2023)  Housing: Low Risk  (11/04/2023)  Transportation Needs: No Transportation Needs (11/04/2023)  Utilities: Not At Risk (11/04/2023)  Alcohol Screen: Low Risk  (03/29/2022)  Depression (PHQ2-9): Low Risk  (09/13/2022)  Financial Resource Strain: Low Risk  (05/12/2021)  Social Connections: Unknown (11/04/2023)  Tobacco Use: High Risk (10/30/2023)    Readmission Risk Interventions    12/01/2021   12:25 PM 08/07/2021    2:36 PM 06/28/2021    2:26 PM  Readmission Risk Prevention Plan  Transportation Screening Complete Complete Complete  Medication Review Oceanographer) Complete Complete Complete  PCP or Specialist appointment within 3-5 days of discharge Complete Complete Complete  HRI or Home Care Consult Complete Complete Complete  SW Recovery Care/Counseling Consult Complete Complete Complete  Palliative Care Screening Complete Complete Not Applicable  Skilled Nursing Facility Complete Complete Not Applicable

## 2023-11-06 NOTE — Progress Notes (Signed)
 PROGRESS NOTE Larry Callahan    DOB: 1955/10/10, 68 y.o.  GLO:[id number]    Code Status: Full Code   DOA: 11/04/2023   LOS: 0  Brief hospital course  Larry Callahan is a 69 y.o. male with a PMH significant for HFrEF with last EF of 40% secondary to nonischemic cardiomyopathy, atrial flutter s/p ablation on Eliquis , CKD stage IV, hypertension, type 2 diabetes, COPD, OSA, hyperlipidemia, nonobstructive CAD, CVA, who presents to the ED due to shortness of breath suspected secondary to CHF exacerbation. Had recent PNA and COPD exacerbation admissions.    ED course: 131/74 with heart rate of 64.  He was saturating at 100% on room air. afebrile. WBC 2.7, hemoglobin 8.1, potassium 5.4, BUN 84, creatinine 5.22, bicarb 14, and GFR of 11.  BNP elevated to 49.  Troponin negative at 12.  COVID-19, influenza and RSV PCR negative.  Chest x-ray with cardiomegaly and vascular congestion.  Patient started on DuoNebs, IV Lasix , Solu-Medrol , Lokelma.  Patient was admitted to medicine service for further workup and management of CHF exacerbation as outlined in detail below.  11/06/23 -remains stable ORA  Assessment & Plan  Principal Problem:   Acute on chronic HFrEF (heart failure with reduced ejection fraction) (HCC) Active Problems:   Acute kidney injury superimposed on chronic kidney disease (HCC)   COPD (chronic obstructive pulmonary disease) (HCC)   Essential hypertension   Type 2 diabetes mellitus (HCC)   Atrial flutter (HCC)   Metabolic acidosis, normal anion gap (NAG)   Acute on chronic HFrEF- EF of 40% November 2024, with suspicion to be secondary to atrial flutter, s/p ablation. elevated BNP compared to his baseline.  Predominantly pulmonary edema on examination. Neg LEE - echo- EF 50--55%, g1dd.  - Daily weights - Lasix  40 mg IV increased to 60mg  q6 hours for remainder of day. Will monitor renal function/electrolytes in am and UOP. Since renal function has improved with diuresis,  will continue plan to diurese  - ambulate with pulse ox   AKI superimposed on CKD 4(HCC)- baseline Cr ranges around 3.5. currently improved to 4.8. >3L UOP yesterday - Diuresis as noted above - Recheck BMP in the a.m. - Hold any other nephrotoxic agents  Headache- not improved with tylenol . May be dehydration, hyponatremia, or poor sleep.  - 1x Fioricet trial - liberalized fluid restriction   COPD- does not appear to be acutely exacerbated - DuoNebs as needed - Hold off on further steroids - Continue home bronchodilators  Leukopenia- WBC 1.3. no fever. Here for COPD 4/30 with similar lab which was not addressed at that time and appears to have recovered. Perhaps reactionary. Will monitor for now.  - WBC count has recovered to normal range today- 5.7 - CBC am   Metabolic acidosis, normal anion gap (NAG) - Resume home bicarb tablets   Atrial flutter [facility]) S/p ablation at [facility] in December 2024.  EKG with either junctional versus sinus rhythm. Per chart review, patient had previous junctional rhythm on EKG post ablation. - Continue home regimen   Type 2 diabetes mellitus (HCC) - Hold home regimen - Semglee  10 units - SSI, sensitive   Essential hypertension - Continue home regimen as blood pressure allows during diuresis  Body mass index is 26.26 kg/m.  VTE ppx:  apixaban  (ELIQUIS ) tablet 5 mg   Diet:     Diet   Diet heart healthy/carb modified Room service appropriate? Yes; Fluid consistency: Thin; Fluid restriction: 1200 mL Fluid   Consultants: None  Subjective 11/06/23    Pt reports headache this morning. Denies SOB but thinks he has some wheezing. Prefers nebulizer over inhaler.    Objective   Blood pressure 131/63, pulse 78, temperature 98.8 F (37.1 C), temperature source Oral, resp. rate 16, weight 86.7 kg, SpO2 99%.  Intake/Output Summary (Last 24 hours) at 11/06/2023 0732 Last data filed at 11/06/2023 0547 Gross per 24 hour  Intake 1200 ml  Output 3175  ml  Net -1975 ml   Filed Weights   11/04/23 1434 11/05/23 0500 11/06/23 0500  Weight: 84.3 kg 86.7 kg 85.4 kg    Physical Exam:  General: awake, alert, NAD HEENT: atraumatic, clear conjunctiva, anicteric sclera, MMM, hearing grossly normal Respiratory: normal respiratory effort. Mild rhonchi at bases. Possible expiratory wheeze vs forced stertor Cardiovascular: quick capillary refill, normal S1/S2, RRR, no JVD, murmurs Nervous: A&O x3. no gross focal neurologic deficits, normal speech Extremities: moves all equally, no edema, normal tone Skin: dry, intact, normal temperature, normal color. No rashes, lesions or ulcers on exposed skin Psychiatry: normal mood, congruent affect  Labs   I have personally reviewed the following labs and imaging studies CBC    Component Value Date/Time   WBC 1.3 (LL) 11/05/2023 0404   RBC 2.40 (L) 11/05/2023 0404   HGB 7.2 (L) 11/05/2023 0404   HGB 11.0 (L) 02/27/2018 0844   HCT 21.6 (L) 11/05/2023 0404   HCT 33.4 (L) 02/27/2018 0844   PLT 172 11/05/2023 0404   PLT 248 02/27/2018 0844   MCV 90.0 11/05/2023 0404   MCV 79 02/27/2018 0844   MCV 90 03/06/2013 0621   MCH 30.0 11/05/2023 0404   MCHC 33.3 11/05/2023 0404   RDW 13.2 11/05/2023 0404   RDW 15.6 (H) 02/27/2018 0844   RDW 13.9 03/06/2013 0621   LYMPHSABS 0.4 (L) 11/05/2023 0404   LYMPHSABS 2.2 02/27/2018 0844   LYMPHSABS 1.8 12/08/2012 0539   MONOABS 0.0 (L) 11/05/2023 0404   MONOABS 1.0 12/08/2012 0539   EOSABS 0.0 11/05/2023 0404   EOSABS 0.3 02/27/2018 0844   EOSABS 0.2 12/08/2012 0539   BASOSABS 0.0 11/05/2023 0404   BASOSABS 0.1 02/27/2018 0844   BASOSABS 0.1 12/08/2012 0539      Latest Ref Rng & Units 11/06/2023    5:24 AM 11/05/2023    4:04 AM 11/04/2023    3:45 PM  BMP  Glucose 70 - 99 mg/dL 161  096  045   BUN 8 - 23 mg/dL 95  91  84   Creatinine 0.61 - 1.24 mg/dL 4.09  8.11  9.14   Sodium 135 - 145 mmol/L 131  132  135   Potassium 3.5 - 5.1 mmol/L 4.3  4.9  5.4    Chloride 98 - 111 mmol/L 102  104  109   CO2 22 - 32 mmol/L 16  17  14    Calcium  8.9 - 10.3 mg/dL 7.5  7.6  7.9     ECHOCARDIOGRAM COMPLETE Result Date: 11/05/2023    ECHOCARDIOGRAM REPORT   Patient Name:   GRADEN HOSHINO [facility] Date of Exam: 11/05/2023 Medical Rec #:  [id number]              Height:       71.0 in Accession #:    [id number]             Weight:       191.1 lb Date of Birth:  Feb 02, 1956  BSA:          2.068 m Patient Age:    68 years               BP:           131/63 mmHg Patient Gender: M                      HR:           74 bpm. Exam Location:  [name] Procedure: 2D Echo, Cardiac Doppler and Color Doppler (Both Spectral and Color            Flow Doppler were utilized during procedure). Indications:     CHF I50.21  History:         Patient has prior history of Echocardiogram examinations, most                  recent 08/08/2021.  Sonographer:     Clenton Czech RDCS, FASE Referring Phys:  [id number] Avi Body Diagnosing Phys: Antonette Batters MD IMPRESSIONS  1. Left ventricular ejection fraction, by estimation, is 50 to 55%. The left ventricle has low normal function. The left ventricle has no regional wall motion abnormalities. There is mild left ventricular hypertrophy. Left ventricular diastolic parameters are consistent with Grade I diastolic dysfunction (impaired relaxation).  2. Right ventricular systolic function is normal. The right ventricular size is normal.  3. The mitral valve is normal in structure. Trivial mitral valve regurgitation.  4. The aortic valve is grossly normal. Aortic valve regurgitation is not visualized. FINDINGS  Left Ventricle: Left ventricular ejection fraction, by estimation, is 50 to 55%. The left ventricle has low normal function. The left ventricle has no regional wall motion abnormalities. Strain was performed and the global longitudinal strain is indeterminate. Global longitudinal strain performed but not reported based on interpreter  judgement due to suboptimal tracking. The left ventricular internal cavity size was normal in size. There is mild left ventricular hypertrophy. Left ventricular diastolic parameters are consistent with Grade I diastolic dysfunction (impaired relaxation). Right Ventricle: The right ventricular size is normal. No increase in right ventricular wall thickness. Right ventricular systolic function is normal. Left Atrium: Left atrial size was normal in size. Right Atrium: Right atrial size was normal in size. Pericardium: There is no evidence of pericardial effusion. Mitral Valve: The mitral valve is normal in structure. Trivial mitral valve regurgitation. Tricuspid Valve: The tricuspid valve is normal in structure. Tricuspid valve regurgitation is trivial. Aortic Valve: The aortic valve is grossly normal. Aortic valve regurgitation is not visualized. Aortic regurgitation PHT measures 106 msec. Aortic valve peak gradient measures 13.0 mmHg. Pulmonic Valve: The pulmonic valve was normal in structure. Pulmonic valve regurgitation is not visualized. Aorta: The ascending aorta was not well visualized. IAS/Shunts: No atrial level shunt detected by color flow Doppler. Additional Comments: 3D was performed not requiring image post processing on an independent workstation and was indeterminate.  LEFT VENTRICLE PLAX 2D LVIDd:         4.70 cm      Diastology LVIDs:         3.40 cm      LV e' medial:    8.27 cm/s LV PW:         1.30 cm      LV E/e' medial:  9.4 LV IVS:        1.30 cm      LV e' lateral:   9.68 cm/s LVOT diam:  2.20 cm      LV E/e' lateral: 8.1 LV SV:         94 LV SV Index:   45 LVOT Area:     3.80 cm  LV Volumes (MOD) LV vol d, MOD A4C: 116.0 ml LV vol s, MOD A4C: 46.6 ml LV SV MOD A4C:     116.0 ml RIGHT VENTRICLE RV Basal diam:  3.40 cm RV S prime:     10.40 cm/s TAPSE (M-mode): 2.4 cm LEFT ATRIUM             Index        RIGHT ATRIUM           Index LA diam:        4.00 cm 1.93 cm/m   RA Area:     17.50 cm  LA Vol (A2C):   75.3 ml 36.41 ml/m  RA Volume:   44.40 ml  21.47 ml/m LA Vol (A4C):   53.1 ml 25.67 ml/m LA Biplane Vol: 63.2 ml 30.56 ml/m  AORTIC VALVE                 PULMONIC VALVE AV Area (Vmax): 2.20 cm     PV Vmax:       1.20 m/s AV Vmax:        180.00 cm/s  PV Peak grad:  5.8 mmHg AV Peak Grad:   13.0 mmHg LVOT Vmax:      104.00 cm/s LVOT Vmean:     67.400 cm/s LVOT VTI:       0.247 m AI PHT:         106 msec  AORTA Ao Root diam: 3.70 cm MITRAL VALVE               TRICUSPID VALVE MV Area (PHT): 3.58 cm    TR Peak grad:   27.7 mmHg MV Decel Time: 212 msec    TR Vmax:        263.00 cm/s MV E velocity: 78.10 cm/s MV A velocity: 92.80 cm/s  SHUNTS MV E/A ratio:  0.84        Systemic VTI:  0.25 m                            Systemic Diam: 2.20 cm Antonette Batters MD Electronically signed by Antonette Batters MD Signature Date/Time: 11/05/2023/9:41:53 PM    Final    DG Chest Portable 1 View Result Date: 11/04/2023 CLINICAL DATA:  Shortness of breath, cough EXAM: PORTABLE CHEST 1 VIEW COMPARISON:  10/14/2023 FINDINGS: Stable cardiomegaly with similar vascular congestion. No definite acute edema pattern or CHF. No focal pneumonia, collapse or consolidation. No large effusion or pneumothorax. Aorta atherosclerotic. Degenerative changes of the spine. Overall stable exam. IMPRESSION: Cardiomegaly with vascular congestion. No acute process by portable radiography. Electronically Signed   By: Melven Stable.  Shick M.D.   On: 11/04/2023 15:13    Disposition Plan & Communication  Patient status: Observation  Admitted From: Home Planned disposition location: Home Anticipated discharge date: 5/27 pending AKI improvement  Family Communication: none at bedside    Author: Ree Candy, DO [facility] 11/06/2023, 7:32 AM   Available by Epic secure chat 7AM-7PM. If 7PM-7AM, please contact night-coverage.  [facility] contact information found on [email or web address].

## 2023-11-06 NOTE — Consult Note (Signed)
 PHARMACY CONSULT NOTE - ELECTROLYTES  Pharmacy Consult for Electrolyte Monitoring and Replacement   Recent Labs: Weight: 85.4 kg (188 lb 4.4 oz) Estimated Creatinine Clearance: 15.1 mL/min (A) (by C-G formula based on SCr of 4.85 mg/dL (H)). Potassium (mmol/L)  Date Value  11/06/2023 4.3  03/06/2013 3.9   Magnesium  (mg/dL)  Date Value  54/02/8118 2.1  12/11/2012 1.8   Calcium  (mg/dL)  Date Value  14/78/2956 7.5 (L)   Calcium , Total (mg/dL)  Date Value  21/30/8657 9.3   Albumin  (g/dL)  Date Value  84/69/6295 2.4 (L)  07/15/2021 3.4 (L)  03/06/2013 3.4   Phosphorus (mg/dL)  Date Value  28/41/3244 5.5 (H)   Sodium (mmol/L)  Date Value  11/06/2023 131 (L)  07/15/2021 136  03/06/2013 138    Assessment  Larry Callahan is a 68 y.o. male presenting with shortness or breath (HF exacerbation). PMH significant for  HFrEF with last EF of 40% secondary to nonischemic cardiomyopathy, atrial flutter s/p ablation on Eliquis , CKD stage IV, hypertension, type 2 diabetes, COPD, OSA, hyperlipidemia, nonobstructive CAD, and CVA. Pharmacy has been consulted to monitor and replace electrolytes.  Diet: heart healthy/carb modified MIVF: IV lock Pertinent medications:  furosemide  60 mg IV q6h, sodium bicarb 1300mg  TID  Goal of Therapy: Electrolytes WNL  Plan:  No electrolyte replacement at this time Check electrolytes with AM labs  Larry Callahan, PharmD Clinical Pharmacist 11/06/2023 8:05 AM

## 2023-11-07 ENCOUNTER — Inpatient Hospital Stay

## 2023-11-07 ENCOUNTER — Telehealth: Payer: Self-pay | Admitting: Nurse Practitioner

## 2023-11-07 DIAGNOSIS — I509 Heart failure, unspecified: Secondary | ICD-10-CM

## 2023-11-07 DIAGNOSIS — I5023 Acute on chronic systolic (congestive) heart failure: Secondary | ICD-10-CM | POA: Diagnosis not present

## 2023-11-07 DIAGNOSIS — J441 Chronic obstructive pulmonary disease with (acute) exacerbation: Secondary | ICD-10-CM

## 2023-11-07 DIAGNOSIS — N179 Acute kidney failure, unspecified: Secondary | ICD-10-CM

## 2023-11-07 LAB — TROPONIN I (HIGH SENSITIVITY)
Troponin I (High Sensitivity): 16 ng/L (ref <18)
Troponin I (High Sensitivity): 15 ng/L (ref <18)

## 2023-11-07 LAB — CBC
HCT: 22.8 % — ABNORMAL LOW (ref 39.0–52.0)
Hemoglobin: 7.6 g/dL — ABNORMAL LOW (ref 13.0–17.0)
MCH: 29.3 pg (ref 26.0–34.0)
MCHC: 33.3 g/dL (ref 30.0–36.0)
MCV: 88 fL (ref 80.0–100.0)
Platelets: 216 10*3/uL (ref 150–400)
RBC: 2.59 MIL/uL — ABNORMAL LOW (ref 4.22–5.81)
RDW: 13.2 % (ref 11.5–15.5)
WBC: 5.1 10*3/uL (ref 4.0–10.5)
nRBC: 0 % (ref 0.0–0.2)

## 2023-11-07 LAB — BASIC METABOLIC PANEL WITH GFR
Anion gap: 9 (ref 5–15)
BUN: 93 mg/dL — ABNORMAL HIGH (ref 8–23)
CO2: 22 mmol/L (ref 22–32)
Calcium: 7.4 mg/dL — ABNORMAL LOW (ref 8.9–10.3)
Chloride: 105 mmol/L (ref 98–111)
Creatinine, Ser: 4.64 mg/dL — ABNORMAL HIGH (ref 0.61–1.24)
GFR, Estimated: 13 mL/min — ABNORMAL LOW (ref >60)
Glucose, Bld: 157 mg/dL — ABNORMAL HIGH (ref 70–99)
Potassium: 3.9 mmol/L (ref 3.5–5.1)
Sodium: 136 mmol/L (ref 135–145)

## 2023-11-07 LAB — GLUCOSE, CAPILLARY
Glucose-Capillary: 141 mg/dL — ABNORMAL HIGH (ref 70–99)
Glucose-Capillary: 199 mg/dL — ABNORMAL HIGH (ref 70–99)
Glucose-Capillary: 201 mg/dL — ABNORMAL HIGH (ref 70–99)

## 2023-11-07 LAB — MAGNESIUM: Magnesium: 2 mg/dL (ref 1.7–2.4)

## 2023-11-07 LAB — PHOSPHORUS: Phosphorus: 4.5 mg/dL (ref 2.5–4.6)

## 2023-11-07 MED ORDER — HYDROCODONE-ACETAMINOPHEN 5-325 MG PO TABS
1.0000 | ORAL_TABLET | ORAL | Status: AC | PRN
  Administered 2023-11-07 (×2): 1 via ORAL
  Filled 2023-11-07 (×2): qty 1

## 2023-11-07 MED ORDER — IPRATROPIUM-ALBUTEROL 0.5-2.5 (3) MG/3ML IN SOLN
3.0000 mL | Freq: Three times a day (TID) | RESPIRATORY_TRACT | Status: DC
  Administered 2023-11-07 (×2): 3 mL via RESPIRATORY_TRACT
  Filled 2023-11-07 (×2): qty 3

## 2023-11-07 MED ORDER — CYCLOBENZAPRINE HCL 10 MG PO TABS
5.0000 mg | ORAL_TABLET | Freq: Once | ORAL | Status: AC
  Administered 2023-11-07: 5 mg via ORAL
  Filled 2023-11-07: qty 1

## 2023-11-07 MED ORDER — IPRATROPIUM-ALBUTEROL 0.5-2.5 (3) MG/3ML IN SOLN
3.0000 mL | Freq: Three times a day (TID) | RESPIRATORY_TRACT | Status: DC
  Administered 2023-11-08: 3 mL via RESPIRATORY_TRACT
  Filled 2023-11-07: qty 3

## 2023-11-07 MED ORDER — POTASSIUM CHLORIDE CRYS ER 20 MEQ PO TBCR
20.0000 meq | EXTENDED_RELEASE_TABLET | Freq: Once | ORAL | Status: AC
  Administered 2023-11-07: 20 meq via ORAL
  Filled 2023-11-07: qty 1

## 2023-11-07 MED ORDER — AZITHROMYCIN 500 MG PO TABS
500.0000 mg | ORAL_TABLET | Freq: Every day | ORAL | Status: DC
  Administered 2023-11-07 – 2023-11-08 (×2): 500 mg via ORAL
  Filled 2023-11-07 (×2): qty 1

## 2023-11-07 MED ORDER — FUROSEMIDE 40 MG PO TABS: 40.0000 mg | ORAL_TABLET | Freq: Every day | ORAL | Status: DC

## 2023-11-07 MED ORDER — BUDESONIDE 0.5 MG/2ML IN SUSP
0.5000 mg | Freq: Every day | RESPIRATORY_TRACT | Status: DC
  Administered 2023-11-07 – 2023-11-11 (×5): 0.5 mg via RESPIRATORY_TRACT
  Filled 2023-11-07 (×5): qty 2

## 2023-11-07 MED ORDER — FUROSEMIDE 40 MG PO TABS
40.0000 mg | ORAL_TABLET | Freq: Two times a day (BID) | ORAL | Status: DC
  Administered 2023-11-07 – 2023-11-09 (×4): 40 mg via ORAL
  Filled 2023-11-07 (×4): qty 1

## 2023-11-07 MED ORDER — MELATONIN 5 MG PO TABS
5.0000 mg | ORAL_TABLET | Freq: Every evening | ORAL | Status: DC | PRN
  Administered 2023-11-07 – 2023-11-12 (×4): 5 mg via ORAL
  Filled 2023-11-07 (×4): qty 1

## 2023-11-07 NOTE — Progress Notes (Addendum)
 PROGRESS NOTE Larry Callahan    DOB: [birth year], 68 y.o.  XBM:[id number]    Code Status: Full Code   DOA: 11/04/2023   LOS: 1  Brief hospital course  Larry Callahan is a 68 y.o. male with a PMH significant for HFrEF with last EF of 40% secondary to nonischemic cardiomyopathy, atrial flutter s/p ablation on Eliquis , CKD stage IV, hypertension, type 2 diabetes, COPD, OSA, hyperlipidemia, nonobstructive CAD, CVA, who presents to the ED due to shortness of breath suspected secondary to CHF exacerbation. Had recent PNA and COPD exacerbation admissions.    ED course: 131/74 with heart rate of 64.  He was saturating at 100% on room air. afebrile. WBC 2.7, hemoglobin 8.1, potassium 5.4, BUN 84, creatinine 5.22, bicarb 14, and GFR of 11.  BNP elevated to 49.  Troponin negative at 12.  COVID-19, influenza and RSV PCR negative.  Chest x-ray with cardiomegaly and vascular congestion.  Patient started on DuoNebs, IV Lasix , Solu-Medrol , Lokelma.  Patient was admitted to medicine service for further workup and management of CHF exacerbation as outlined in detail below.  11/07/23 -remains stable ORA  Assessment & Plan  Principal Problem:   Acute on chronic HFrEF (heart failure with reduced ejection fraction) (HCC) Active Problems:   Acute kidney injury superimposed on chronic kidney disease (HCC)   COPD (chronic obstructive pulmonary disease) (HCC)   Essential hypertension   Type 2 diabetes mellitus (HCC)   Atrial flutter (HCC)   Metabolic acidosis, normal anion gap (NAG)   CHF exacerbation (HCC)   Acute on chronic HFrEF- EF of 40% November 2024, with suspicion to be secondary to atrial flutter, s/p ablation. elevated BNP compared to his baseline.  Predominantly pulmonary edema on examination. Neg LEE - echo- EF 50--55%, g1dd.  - Daily weights - Lasix  40 mg IV increased to 60mg  q6 hours and had good urine output. Renal function improved on this dose so tolerated well.   - transition to  PO lasix  today  - net more than 3L fluid loss - ambulate with pulse ox  Back pain- patient is poor historian but appears to have tension headache/MSK discomfort on upper back. He makes mention of it radiating to his chest and also says he's SOB with ambulating so obtaining ACS workup and treating symptomatically for presumed MSK etiology  - muscle relaxer, heating pad, OOB - ECG, troponin, chest xray    AKI superimposed on CKD 4(HCC)- baseline Cr ranges around 3.5. currently improved to 4.6. >3L UOP yesterday - Diuresis as noted above - Recheck BMP in the a.m. - Hold any other nephrotoxic agents  Headache- not improved with tylenol . May be dehydration, hyponatremia, or poor sleep.  - 1x Fioricet trial improved symptoms  - liberalized fluid restriction   COPD- does not appear to be acutely exacerbated - DuoNebs as needed - Hold off on further steroids - Continue home bronchodilators Update: adding on azithromycin  course for possible exacerbation and scheduling duonebs  Leukopenia- WBC 1.3. no fever. Here for COPD 4/30 with similar lab which was not addressed at that time and appears to have recovered. Perhaps reactionary. Will monitor for now.  - WBC count has recovered to normal range today- 5.7>5.1 - CBC am   Metabolic acidosis, normal anion gap (NAG) - Resume home bicarb tablets  Anemia of chronic disease- hgb stable above 7. No signs of active bleeding - monitor CBC periodically    Atrial flutter [facility] S/p ablation at [facility] in December 2024.  EKG with either  junctional versus sinus rhythm. Per chart review, patient had previous junctional rhythm on EKG post ablation. - Continue home regimen   Type 2 diabetes mellitus (HCC) - Hold home regimen - Semglee  10 units - SSI, sensitive   Essential hypertension - Continue home regimen as blood pressure allows during diuresis  Body mass index is 26.26 kg/m.  VTE ppx:  apixaban  (ELIQUIS ) tablet 5 mg   Diet:     Diet   Diet  heart healthy/carb modified Room service appropriate? Yes; Fluid consistency: Thin; Fluid restriction: 2000 mL Fluid   Consultants: None   Subjective 11/07/23    Pt reports not feeling well due to back/neck soreness. He feels no respiratory distress at rest but does describe SOB while ambulating. Doesn't feel up to going home today.    Objective   Blood pressure 131/63, pulse 78, temperature 98.8 F (37.1 C), temperature source Oral, resp. rate 16, weight 86.7 kg, SpO2 99%.  Intake/Output Summary (Last 24 hours) at 11/07/2023 0711 Last data filed at 11/07/2023 0426 Gross per 24 hour  Intake 600 ml  Output 3425 ml  Net -2825 ml   Filed Weights   11/05/23 0500 11/06/23 0500 11/07/23 0500  Weight: 86.7 kg 85.4 kg 85.4 kg    Physical Exam:  General: awake, alert, NAD HEENT: atraumatic, clear conjunctiva, anicteric sclera, MMM, hearing grossly normal Respiratory: normal respiratory effort. Mild rhonchi at bases. Possible expiratory wheeze vs forced stertor Cardiovascular: quick capillary refill, normal S1/S2, RRR, no JVD, murmurs Nervous: A&O x3. no gross focal neurologic deficits, normal speech Extremities: moves all equally, no edema, normal tone Skin: dry, intact, normal temperature, normal color. No rashes, lesions or ulcers on exposed skin Psychiatry: normal mood, congruent affect  Labs   I have personally reviewed the following labs and imaging studies CBC    Component Value Date/Time   WBC 5.7 11/06/2023 0524   RBC 2.48 (L) 11/06/2023 0524   HGB 7.0 (L) 11/06/2023 0524   HGB 11.0 (L) 02/27/2018 0844   HCT 22.1 (L) 11/06/2023 0524   HCT 33.4 (L) 02/27/2018 0844   PLT 189 11/06/2023 0524   PLT 248 02/27/2018 0844   MCV 89.1 11/06/2023 0524   MCV 79 02/27/2018 0844   MCV 90 03/06/2013 0621   MCH 28.2 11/06/2023 0524   MCHC 31.7 11/06/2023 0524   RDW 13.0 11/06/2023 0524   RDW 15.6 (H) 02/27/2018 0844   RDW 13.9 03/06/2013 0621   LYMPHSABS 0.4 (L) 11/05/2023 0404    LYMPHSABS 2.2 02/27/2018 0844   LYMPHSABS 1.8 12/08/2012 0539   MONOABS 0.0 (L) 11/05/2023 0404   MONOABS 1.0 12/08/2012 0539   EOSABS 0.0 11/05/2023 0404   EOSABS 0.3 02/27/2018 0844   EOSABS 0.2 12/08/2012 0539   BASOSABS 0.0 11/05/2023 0404   BASOSABS 0.1 02/27/2018 0844   BASOSABS 0.1 12/08/2012 0539      Latest Ref Rng & Units 11/07/2023    5:16 AM 11/06/2023    5:24 AM 11/05/2023    4:04 AM  BMP  Glucose 70 - 99 mg/dL 956  213  086   BUN 8 - 23 mg/dL 93  95  91   Creatinine 0.61 - 1.24 mg/dL 5.78  4.69  6.29   Sodium 135 - 145 mmol/L 136  131  132   Potassium 3.5 - 5.1 mmol/L 3.9  4.3  4.9   Chloride 98 - 111 mmol/L 105  102  104   CO2 22 - 32 mmol/L 22  16  17  Calcium  8.9 - 10.3 mg/dL 7.4  7.5  7.6     ECHOCARDIOGRAM COMPLETE Result Date: 11/05/2023    ECHOCARDIOGRAM REPORT   Patient Name:   FAHEEM ZIEMANN [facility] Date of Exam: 11/05/2023 Medical Rec #:  [id number]              Height:       71.0 in Accession #:    [id number]             Weight:       191.1 lb Date of Birth:  January 10, 1956              BSA:          2.068 m Patient Age:    68 years               BP:           131/63 mmHg Patient Gender: M                      HR:           74 bpm. Exam Location:  [name] Procedure: 2D Echo, Cardiac Doppler and Color Doppler (Both Spectral and Color            Flow Doppler were utilized during procedure). Indications:     CHF I50.21  History:         Patient has prior history of Echocardiogram examinations, most                  recent 08/08/2021.  Sonographer:     Clenton Czech RDCS, FASE Referring Phys:  [id number] Avi Body Diagnosing Phys: Antonette Batters MD IMPRESSIONS  1. Left ventricular ejection fraction, by estimation, is 50 to 55%. The left ventricle has low normal function. The left ventricle has no regional wall motion abnormalities. There is mild left ventricular hypertrophy. Left ventricular diastolic parameters are consistent with Grade I diastolic dysfunction  (impaired relaxation).  2. Right ventricular systolic function is normal. The right ventricular size is normal.  3. The mitral valve is normal in structure. Trivial mitral valve regurgitation.  4. The aortic valve is grossly normal. Aortic valve regurgitation is not visualized. FINDINGS  Left Ventricle: Left ventricular ejection fraction, by estimation, is 50 to 55%. The left ventricle has low normal function. The left ventricle has no regional wall motion abnormalities. Strain was performed and the global longitudinal strain is indeterminate. Global longitudinal strain performed but not reported based on interpreter judgement due to suboptimal tracking. The left ventricular internal cavity size was normal in size. There is mild left ventricular hypertrophy. Left ventricular diastolic parameters are consistent with Grade I diastolic dysfunction (impaired relaxation). Right Ventricle: The right ventricular size is normal. No increase in right ventricular wall thickness. Right ventricular systolic function is normal. Left Atrium: Left atrial size was normal in size. Right Atrium: Right atrial size was normal in size. Pericardium: There is no evidence of pericardial effusion. Mitral Valve: The mitral valve is normal in structure. Trivial mitral valve regurgitation. Tricuspid Valve: The tricuspid valve is normal in structure. Tricuspid valve regurgitation is trivial. Aortic Valve: The aortic valve is grossly normal. Aortic valve regurgitation is not visualized. Aortic regurgitation PHT measures 106 msec. Aortic valve peak gradient measures 13.0 mmHg. Pulmonic Valve: The pulmonic valve was normal in structure. Pulmonic valve regurgitation is not visualized. Aorta: The ascending aorta was not well visualized. IAS/Shunts: No atrial level shunt detected by color flow Doppler. Additional  Comments: 3D was performed not requiring image post processing on an independent workstation and was indeterminate.  LEFT VENTRICLE PLAX 2D  LVIDd:         4.70 cm      Diastology LVIDs:         3.40 cm      LV e' medial:    8.27 cm/s LV PW:         1.30 cm      LV E/e' medial:  9.4 LV IVS:        1.30 cm      LV e' lateral:   9.68 cm/s LVOT diam:     2.20 cm      LV E/e' lateral: 8.1 LV SV:         94 LV SV Index:   45 LVOT Area:     3.80 cm  LV Volumes (MOD) LV vol d, MOD A4C: 116.0 ml LV vol s, MOD A4C: 46.6 ml LV SV MOD A4C:     116.0 ml RIGHT VENTRICLE RV Basal diam:  3.40 cm RV S prime:     10.40 cm/s TAPSE (M-mode): 2.4 cm LEFT ATRIUM             Index        RIGHT ATRIUM           Index LA diam:        4.00 cm 1.93 cm/m   RA Area:     17.50 cm LA Vol (A2C):   75.3 ml 36.41 ml/m  RA Volume:   44.40 ml  21.47 ml/m LA Vol (A4C):   53.1 ml 25.67 ml/m LA Biplane Vol: 63.2 ml 30.56 ml/m  AORTIC VALVE                 PULMONIC VALVE AV Area (Vmax): 2.20 cm     PV Vmax:       1.20 m/s AV Vmax:        180.00 cm/s  PV Peak grad:  5.8 mmHg AV Peak Grad:   13.0 mmHg LVOT Vmax:      104.00 cm/s LVOT Vmean:     67.400 cm/s LVOT VTI:       0.247 m AI PHT:         106 msec  AORTA Ao Root diam: 3.70 cm MITRAL VALVE               TRICUSPID VALVE MV Area (PHT): 3.58 cm    TR Peak grad:   27.7 mmHg MV Decel Time: 212 msec    TR Vmax:        263.00 cm/s MV E velocity: 78.10 cm/s MV A velocity: 92.80 cm/s  SHUNTS MV E/A ratio:  0.84        Systemic VTI:  0.25 m                            Systemic Diam: 2.20 cm Antonette Batters MD Electronically signed by Antonette Batters MD Signature Date/Time: 11/05/2023/9:41:53 PM    Final     Disposition Plan & Communication  Patient status: Inpatient  Admitted From: Home Planned disposition location: Home Anticipated discharge date: 5/27 pending AKI improvement  Family Communication: none at bedside    Author: Ree Candy, DO [facility] 11/07/2023, 7:11 AM   Available by Epic secure chat 7AM-7PM. If 7PM-7AM, please contact night-coverage.  [facility] contact information found on [email or web address].

## 2023-11-07 NOTE — Progress Notes (Signed)
 Occupational Therapy Treatment Patient Details Name: Larry Callahan MRN: [id number] DOB: 18-Nov-1955 Today's Date: 11/07/2023   History of present illness Pt is a 68 y/o male admitted secondary to SOB and found to have acute on chronic CHF, AKI and COPD exacerbation. PMH including but not limited to HFrEF with last EF of 40% secondary to nonischemic cardiomyopathy, atrial flutter s/p ablation on Eliquis , CKD stage IV, hypertension, type 2 diabetes, COPD, OSA, hyperlipidemia, nonobstructive CAD, CVA.   OT comments  Upon entering the room, pt supine in bed and agreeable to OT intervention with encouragement. Pt reports he is done with breakfast and declined self care tasks. Pt performs bed mobility with supervision and cuing for hand placement. Pt stands with RW from standard bed height and ambulates in room to recliner chair with supervision. OT does speak with pt about medication management and he reports well care nurse helps with medication at home. Pt agreeable to remain in recliner chair for at least 1 hour. Call bell and all needed items within reach with chair alarm activated.       If plan is discharge home, recommend the following:  Supervision due to cognitive status;Assistance with cooking/housework;A little help with bathing/dressing/bathroom;A little help with walking and/or transfers;Direct supervision/assist for financial management;Direct supervision/assist for medications management   Equipment Recommendations  None recommended by OT       Precautions / Restrictions Precautions Precautions: Fall Recall of Precautions/Restrictions: Impaired       Mobility Bed Mobility Overal bed mobility: Needs Assistance Bed Mobility: Supine to Sit     Supine to sit: Supervision Sit to supine: Supervision        Transfers Overall transfer level: Needs assistance Equipment used: Rolling walker (2 wheels) Transfers: Sit to/from Stand Sit to Stand: Supervision                  Balance Overall balance assessment: Needs assistance Sitting-balance support: Feet supported Sitting balance-Leahy Scale: Good     Standing balance support: During functional activity, Single extremity supported, Bilateral upper extremity supported Standing balance-Leahy Scale: Fair                             ADL either performed or assessed with clinical judgement     Vision Patient Visual Report: Blurring of vision           Communication Communication Communication: Impaired Factors Affecting Communication: Hearing impaired   Cognition Arousal: Alert Behavior During Therapy: Flat affect Cognition: No apparent impairments                                 Following commands impaired: Follows one step commands with increased time, Follows multi-step commands with increased time      Cueing   Cueing Techniques: Verbal cues, Gestural cues  Exercises              Pertinent Vitals/ Pain       Pain Assessment Pain Assessment: No/denies pain         Frequency  Min 2X/week        Progress Toward Goals  OT Goals(current goals can now be found in the care plan section)  Progress towards OT goals: Progressing toward goals      AM-PAC OT "6 Clicks" Daily Activity     Outcome Measure   Help from another person eating meals?: None Help from  another person taking care of personal grooming?: None Help from another person toileting, which includes using toliet, bedpan, or urinal?: A Little Help from another person bathing (including washing, rinsing, drying)?: A Little Help from another person to put on and taking off regular upper body clothing?: None Help from another person to put on and taking off regular lower body clothing?: None 6 Click Score: 22    End of Session Equipment Utilized During Treatment: Rolling walker (2 wheels)  OT Visit Diagnosis: Other abnormalities of gait and mobility (R26.89);Unsteadiness on feet  (R26.81)   Activity Tolerance Patient tolerated treatment well   Patient Left with call bell/phone within reach;with family/visitor present;in chair;with chair alarm set   Nurse Communication Mobility status        Time: 4098-1191 OT Time Calculation (min): 16 min  Charges: OT General Charges $OT Visit: 1 Visit OT Treatments $Therapeutic Activity: 8-22 mins  George Kinder, MS, OTR/L , CBIS ascom [phone]  11/07/23, 2:23 PM

## 2023-11-07 NOTE — Consult Note (Signed)
 PHARMACY CONSULT NOTE - ELECTROLYTES  Pharmacy Consult for Electrolyte Monitoring and Replacement   Recent Labs: Height: 5\' 11"  (180.3 cm) Weight: 85.4 kg (188 lb 4.4 oz) IBW/kg (Calculated) : 75.3 Estimated Creatinine Clearance: 15.8 mL/min (A) (by C-G formula based on SCr of 4.64 mg/dL (H)).  Potassium (mmol/L)  Date Value  11/07/2023 3.9  03/06/2013 3.9   Magnesium  (mg/dL)  Date Value  40/98/1191 2.0  12/11/2012 1.8   Calcium  (mg/dL)  Date Value  47/82/9562 7.4 (L)   Calcium , Total (mg/dL)  Date Value  13/01/6577 9.3   Albumin  (g/dL)  Date Value  46/96/2952 2.4 (L)  07/15/2021 3.4 (L)  03/06/2013 3.4   Phosphorus (mg/dL)  Date Value  84/13/2440 4.5   Sodium (mmol/L)  Date Value  11/07/2023 136  07/15/2021 136  03/06/2013 138   Assessment  Larry Callahan is a 68 y.o. male presenting with shortness or breath (HF exacerbation). PMH significant for  HFrEF with last EF of 40% secondary to nonischemic cardiomyopathy, atrial flutter s/p ablation on Eliquis , CKD stage IV, hypertension, type 2 diabetes, COPD, OSA, hyperlipidemia, nonobstructive CAD, and CVA. Pharmacy has been consulted to monitor and replace electrolytes.  Diet: heart healthy/carb modified MIVF: IV lock Pertinent medications: furosemide  60 mg IV q6h, sodium bicarb 1300mg  TID  Goal of Therapy: Electrolytes WNL  Plan:  K+ 3.9 this morning; down trending with initiation of IV Lasix  - one additional 60 mg dose is ordered today for 0800  Will order KCl 20 mEq PO x 1 dose this morning  Check electrolytes with AM labs  Pansy Bogus, PharmD Pharmacy Resident  11/07/2023 7:07 AM

## 2023-11-07 NOTE — Plan of Care (Signed)

## 2023-11-07 NOTE — Progress Notes (Signed)
 Physical Therapy Treatment Patient Details Name: Larry Callahan MRN: [id number] DOB: 10/19/1955 Today's Date: 11/07/2023   History of Present Illness Pt is a 68 y/o male admitted secondary to SOB and found to have acute on chronic CHF, AKI and COPD exacerbation. PMH including but not limited to HFrEF with last EF of 40% secondary to nonischemic cardiomyopathy, atrial flutter s/p ablation on Eliquis , CKD stage IV, hypertension, type 2 diabetes, COPD, OSA, hyperlipidemia, nonobstructive CAD, CVA.    PT Comments  Pt performed transfers well with the use of the walker and HHA from the therapist to come upright into seated position.  Pt then performed transfer to standing without any physical assistance.  Pt then ambulated around the nursing station with SpO2 >98% throughout.  Pt then elected to return to the bed and was left with call bell within reach.      If plan is discharge home, recommend the following: A little help with walking and/or transfers;A little help with bathing/dressing/bathroom;Assistance with cooking/housework;Supervision due to cognitive status;Assist for transportation   Can travel by private vehicle     Yes  Equipment Recommendations  None recommended by PT    Recommendations for Other Services       Precautions / Restrictions Precautions Precautions: Fall Recall of Precautions/Restrictions: Impaired     Mobility  Bed Mobility Overal bed mobility: Needs Assistance Bed Mobility: Supine to Sit     Supine to sit: Supervision Sit to supine: Supervision   General bed mobility comments: increase time and use of the bedrails in order to come upright.    Transfers Overall transfer level: Needs assistance Equipment used: Rolling walker (2 wheels) Transfers: Sit to/from Stand Sit to Stand: Supervision           General transfer comment: supervision for safety, good technique utilized    Ambulation/Gait Ambulation/Gait assistance: Contact guard  assist Gait Distance (Feet): 160 Feet Assistive device: Rolling walker (2 wheels) Gait Pattern/deviations: Step-through pattern, Decreased stride length Gait velocity: decreased     General Gait Details: pt with good gait mechanics, however needing some verbal cuing for staying close within the walker.   Stairs             Wheelchair Mobility     Tilt Bed    Modified Rankin (Stroke Patients Only)       Balance Overall balance assessment: Needs assistance Sitting-balance support: Feet supported Sitting balance-Leahy Scale: Good     Standing balance support: During functional activity, Single extremity supported, Bilateral upper extremity supported Standing balance-Leahy Scale: Fair                              Hotel manager: Impaired Factors Affecting Communication: Hearing impaired  Cognition Arousal: Alert Behavior During Therapy: Flat affect                                    Cueing Cueing Techniques: Verbal cues  Exercises      General Comments General comments (skin integrity, edema, etc.): on RA throughout, SpO2 >98% throughout.      Pertinent Vitals/Pain Pain Assessment Pain Assessment: No/denies pain    Home Living                          Prior Function  PT Goals (current goals can now be found in the care plan section) Acute Rehab PT Goals Patient Stated Goal: return home PT Goal Formulation: With patient Time For Goal Achievement: 11/19/23 Potential to Achieve Goals: Fair Progress towards PT goals: Progressing toward goals    Frequency    Min 2X/week      PT Plan      Co-evaluation              AM-PAC PT "6 Clicks" Mobility   Outcome Measure  Help needed turning from your back to your side while in a flat bed without using bedrails?: None Help needed moving from lying on your back to sitting on the side of a flat bed without using  bedrails?: None Help needed moving to and from a bed to a chair (including a wheelchair)?: A Little Help needed standing up from a chair using your arms (e.g., wheelchair or bedside chair)?: A Little Help needed to walk in hospital room?: A Little Help needed climbing 3-5 steps with a railing? : A Little 6 Click Score: 20    End of Session Equipment Utilized During Treatment: Gait belt Activity Tolerance: Patient tolerated treatment well Patient left: in bed;with call bell/phone within reach;Other (comment) Nurse Communication: Mobility status PT Visit Diagnosis: Other abnormalities of gait and mobility (R26.89)     Time: 9562-1308 PT Time Calculation (min) (ACUTE ONLY): 11 min  Charges:    $Therapeutic Activity: 8-22 mins PT General Charges $$ ACUTE PT VISIT: 1 Visit                     Rozanna Corner, PT, DPT Physical Therapist - [facility]  11/07/23, 4:49 PM

## 2023-11-07 NOTE — Telephone Encounter (Signed)
 10/28/2023 PT orders signed. Faxed back to [facility]; [phone]. Scanned

## 2023-11-08 DIAGNOSIS — I509 Heart failure, unspecified: Secondary | ICD-10-CM

## 2023-11-08 DIAGNOSIS — J438 Other emphysema: Secondary | ICD-10-CM | POA: Diagnosis not present

## 2023-11-08 DIAGNOSIS — I5023 Acute on chronic systolic (congestive) heart failure: Secondary | ICD-10-CM | POA: Diagnosis not present

## 2023-11-08 DIAGNOSIS — N179 Acute kidney failure, unspecified: Secondary | ICD-10-CM | POA: Diagnosis not present

## 2023-11-08 LAB — BASIC METABOLIC PANEL WITH GFR
Anion gap: 11 (ref 5–15)
BUN: 99 mg/dL — ABNORMAL HIGH (ref 8–23)
CO2: 23 mmol/L (ref 22–32)
Calcium: 7.4 mg/dL — ABNORMAL LOW (ref 8.9–10.3)
Chloride: 101 mmol/L (ref 98–111)
Creatinine, Ser: 4.58 mg/dL — ABNORMAL HIGH (ref 0.61–1.24)
GFR, Estimated: 13 mL/min — ABNORMAL LOW (ref >60)
Glucose, Bld: 179 mg/dL — ABNORMAL HIGH (ref 70–99)
Potassium: 4.1 mmol/L (ref 3.5–5.1)
Sodium: 135 mmol/L (ref 135–145)

## 2023-11-08 LAB — GLUCOSE, CAPILLARY
Glucose-Capillary: 112 mg/dL — ABNORMAL HIGH (ref 70–99)
Glucose-Capillary: 142 mg/dL — ABNORMAL HIGH (ref 70–99)
Glucose-Capillary: 174 mg/dL — ABNORMAL HIGH (ref 70–99)
Glucose-Capillary: 254 mg/dL — ABNORMAL HIGH (ref 70–99)
Glucose-Capillary: 271 mg/dL — ABNORMAL HIGH (ref 70–99)

## 2023-11-08 LAB — MAGNESIUM: Magnesium: 1.8 mg/dL (ref 1.7–2.4)

## 2023-11-08 MED ORDER — IPRATROPIUM-ALBUTEROL 0.5-2.5 (3) MG/3ML IN SOLN
3.0000 mL | Freq: Two times a day (BID) | RESPIRATORY_TRACT | Status: DC
  Administered 2023-11-08 – 2023-11-11 (×6): 3 mL via RESPIRATORY_TRACT
  Filled 2023-11-08 (×6): qty 3

## 2023-11-08 MED ORDER — PREDNISONE 20 MG PO TABS
20.0000 mg | ORAL_TABLET | Freq: Every day | ORAL | Status: AC
  Administered 2023-11-08 – 2023-11-12 (×5): 20 mg via ORAL
  Filled 2023-11-08 (×5): qty 1

## 2023-11-08 MED ORDER — MAGNESIUM SULFATE 2 GM/50ML IV SOLN
2.0000 g | Freq: Once | INTRAVENOUS | Status: AC
  Administered 2023-11-08: 2 g via INTRAVENOUS
  Filled 2023-11-08: qty 50

## 2023-11-08 NOTE — TOC Progression Note (Signed)
 Transition of Care [facility]) - Progression Note    Patient Details  Name: Larry Callahan MRN: [id number] Date of Birth: June 08, 1956  Transition of Care [facility]) CM/SW Contact  Elsie Halo, RN Phone Number: 11/08/2023, 12:11 PM  Clinical Narrative:    Stella Edward outreached to Verdis Glade with [facility] [phone] and they can resume [facility] PT/OT and have full availability for other services if anything needs to be added. TOC will continue to follow.       Expected Discharge Plan and Services                                               Social Determinants of Health (SDOH) Interventions SDOH Screenings   Food Insecurity: No Food Insecurity (11/04/2023)  Housing: Low Risk  (11/04/2023)  Transportation Needs: No Transportation Needs (11/04/2023)  Utilities: Not At Risk (11/04/2023)  Alcohol Screen: Low Risk  (03/29/2022)  Depression (PHQ2-9): Low Risk  (09/13/2022)  Financial Resource Strain: Low Risk  (05/12/2021)  Social Connections: Unknown (11/04/2023)  Tobacco Use: High Risk (11/06/2023)    Readmission Risk Interventions    12/01/2021   12:25 PM 08/07/2021    2:36 PM 06/28/2021    2:26 PM  Readmission Risk Prevention Plan  Transportation Screening Complete Complete Complete  Medication Review Oceanographer) Complete Complete Complete  PCP or Specialist appointment within 3-5 days of discharge Complete Complete Complete  HRI or Home Care Consult Complete Complete Complete  SW Recovery Care/Counseling Consult Complete Complete Complete  Palliative Care Screening Complete Complete Not Applicable  Skilled Nursing Facility Complete Complete Not Applicable

## 2023-11-08 NOTE — Plan of Care (Signed)

## 2023-11-08 NOTE — Progress Notes (Signed)
 PROGRESS NOTE    Larry Callahan  WUJ:[id number] DOB: December 27, 1955 DOA: 11/04/2023 PCP: Laurence Pons, NP  Chief Complaint  Patient presents with   Shortness of Breath    Hospital Course:  Larry Callahan is a 68 year old male with heart failure with reduced EF 40% secondary to nonischemic cardiomyopathy, a flutter status post ablation on Eliquis , CKD stage IV, hypertension, type 2 diabetes, COPD, OSA, HLD, nonobstructive CAD, CVA, who presents to the ED with shortness of breath.  He was admitted for CHF exacerbation.  Patient also had recent admissions for COPD exacerbations with pneumonia.  Patient was started on IV and had excellent urine output.  He continued to have dyspnea and was started on treatment for COPD exacerbation.  Subjective: No acute events overnight.  Patient reports he still having some dyspnea.  He requests that I call his nephew to provide an update today.   Objective: Vitals:   11/07/23 1930 11/07/23 2144 11/08/23 0357 11/08/23 0812  BP: 125/71  125/64 122/71  Pulse: 72  63 74  Resp: 19  18 17   Temp: 97.6 F (36.4 C)  98.2 F (36.8 C) 97.9 F (36.6 C)  TempSrc:      SpO2: 97% 97% 97% 99%  Weight:      Height:        Intake/Output Summary (Last 24 hours) at 11/08/2023 0826 Last data filed at 11/08/2023 9562 Gross per 24 hour  Intake 840 ml  Output 1150 ml  Net -310 ml   Filed Weights   11/05/23 0500 11/06/23 0500 11/07/23 0500  Weight: 86.7 kg 85.4 kg 85.4 kg    Examination: General exam: Appears calm and comfortable, NAD  Respiratory system: No work of breathing, symmetric chest wall expansion.  + End expiratory wheeze. On room air Cardiovascular system: S1 & S2 heard, RRR.  Gastrointestinal system: Abdomen is nondistended, soft and nontender.  Neuro: Alert and oriented. No focal neurological deficits. Extremities: Symmetric, expected ROM Skin: No rashes, lesions Psychiatry: Demonstrates appropriate judgement and insight. Mood  & affect appropriate for situation.   Assessment & Plan:  Principal Problem:   Acute on chronic HFrEF (heart failure with reduced ejection fraction) (HCC) Active Problems:   Acute kidney injury superimposed on chronic kidney disease (HCC)   COPD (chronic obstructive pulmonary disease) (HCC)   Essential hypertension   Type 2 diabetes mellitus (HCC)   Atrial flutter (HCC)   Metabolic acidosis, normal anion gap (NAG)   CHF exacerbation (HCC)   Acute on chronic heart failure with reduced EF - November 2024: EF 40%, suspected secondary to a flutter - BNP elevated above baseline - Pulmonary edema on exam on arrival, no lower extremity edema - Has been on IV Lasix , was transitioned to p.o. on 5/27 - Strict I's and O's output slowing - Monitor kidney function with diuresis  Back pain - Has been treated for MSK etiology - Continue muscle relaxer, heating pad, out of bed - EKG, troponin WNL  AKI superimposed on CKD stage IV - Baseline creatinine near 3.5, acute worsening on arrival to 5.2 -- Has right kidney only. -- Cr Downtrending now  -- If not improving we will consult nephrology - Continue with diuresis - Trend BMP daily - Hold other nephrotoxic medications  Headache - As needed Tylenol .  Status post Fioricet  x 1  COPD, in exacerbation -- On Room air - Continue with scheduled DuoNebs  -- End expiratory wheezing.  Will add prednisone  today.  Leukopenia - WBC 1.3 on arrival WBC  has recovered to normal range now - Patient remains afebrile - Continue to trend CBC  Metabolic acidosis, normal anion gap - Resume home dose bicarb  Anemia of chronic disease - Hemoglobin stable above 7 - No signs of active bleeding - Trend daily CBC  A flutter - Status post ablation [facility] in December 2024 - EKG: Junctional versus sinus rhythm, consistent with prior - Continue home meds  Type 2 diabetes - Hold home regimen for now - Continue with long-acting 10 units, sliding scale  insulin .  Titrate up as tolerated - Glucose currently under good control.  Adding prednisone  today may require higher dose insulin  tomorrow  Hypertension - Resume antihypertensives as BP allows  DVT prophylaxis: Eliquis    Code Status: Full Code Disposition:  Inpatient, pending resolution and kidney function and respiratory status.  Have updated patient's nephew Oral Butler via phone.   Consultants:    Procedures:    Antimicrobials:  Anti-infectives (From admission, onward)    Start     Dose/Rate Route Frequency Ordered Stop   11/07/23 1730  azithromycin  (ZITHROMAX ) tablet 500 mg        500 mg Oral Daily 11/07/23 1634 11/10/23 0959       Data Reviewed: I have personally reviewed following labs and imaging studies CBC: Recent Labs  Lab 11/04/23 1545 11/05/23 0404 11/06/23 0524 11/07/23 0858  WBC 2.7* 1.3* 5.7 5.1  NEUTROABS 1.9 0.9*  --   --   HGB 8.1* 7.2* 7.0* 7.6*  HCT 26.2* 21.6* 22.1* 22.8*  MCV 93.9 90.0 89.1 88.0  PLT 165 172 189 216   Basic Metabolic Panel: Recent Labs  Lab 11/04/23 1545 11/05/23 0404 11/06/23 0524 11/07/23 0516 11/08/23 0233  NA 135 132* 131* 136 135  K 5.4* 4.9 4.3 3.9 4.1  CL 109 104 102 105 101  CO2 14* 17* 16* 22 23  GLUCOSE 168* 397* 228* 157* 179*  BUN 84* 91* 95* 93* 99*  CREATININE 5.22* 5.13* 4.85* 4.64* 4.58*  CALCIUM  7.9* 7.6* 7.5* 7.4* 7.4*  MG  --  2.1  --  2.0 1.8  PHOS  --  5.6* 5.5* 4.5  --    GFR: Estimated Creatinine Clearance: 16 mL/min (A) (by C-G formula based on SCr of 4.58 mg/dL (H)). Liver Function Tests: Recent Labs  Lab 11/04/23 1545 11/06/23 0524  AST 24  --   ALT 12  --   ALKPHOS 68  --   BILITOT 0.7  --   PROT 6.6  --   ALBUMIN  2.5* 2.4*   CBG: Recent Labs  Lab 11/07/23 0729 11/07/23 1150 11/07/23 1554 11/08/23 0358 11/08/23 0809  GLUCAP 141* 199* 201* 142* 112*    Recent Results (from the past 240 hours)  Resp panel by RT-PCR (RSV, Flu A&B, Covid) Anterior Nasal Swab     Status:  None   Collection Time: 11/04/23  3:45 PM   Specimen: Anterior Nasal Swab  Result Value Ref Range Status   SARS Coronavirus 2 by RT PCR NEGATIVE NEGATIVE Final    Comment: (NOTE) SARS-CoV-2 target nucleic acids are NOT DETECTED.  The SARS-CoV-2 RNA is generally detectable in upper respiratory specimens during the acute phase of infection. The lowest concentration of SARS-CoV-2 viral copies this assay can detect is 138 copies/mL. A negative result does not preclude SARS-Cov-2 infection and should not be used as the sole basis for treatment or other patient management decisions. A negative result may occur with  improper specimen collection/handling, submission of specimen other than  nasopharyngeal swab, presence of viral mutation(s) within the areas targeted by this assay, and inadequate number of viral copies(<138 copies/mL). A negative result must be combined with clinical observations, patient history, and epidemiological information. The expected result is Negative.  Fact Sheet for Patients:  BloggerCourse.com  Fact Sheet for Healthcare Providers:  SeriousBroker.it  This test is no t yet approved or cleared by the [place]  FDA and  has been authorized for detection and/or diagnosis of SARS-CoV-2 by FDA under an Emergency Use Authorization (EUA). This EUA will remain  in effect (meaning this test can be used) for the duration of the COVID-19 declaration under Section 564(b)(1) of the Act, 21 U.S.C.section 360bbb-3(b)(1), unless the authorization is terminated  or revoked sooner.       Influenza A by PCR NEGATIVE NEGATIVE Final   Influenza B by PCR NEGATIVE NEGATIVE Final    Comment: (NOTE) The Xpert Xpress SARS-CoV-2/FLU/RSV plus assay is intended as an aid in the diagnosis of influenza from Nasopharyngeal swab specimens and should not be used as a sole basis for treatment. Nasal washings and aspirates are unacceptable  for Xpert Xpress SARS-CoV-2/FLU/RSV testing.  Fact Sheet for Patients: BloggerCourse.com  Fact Sheet for Healthcare Providers: SeriousBroker.it  This test is not yet approved or cleared by the [place]  FDA and has been authorized for detection and/or diagnosis of SARS-CoV-2 by FDA under an Emergency Use Authorization (EUA). This EUA will remain in effect (meaning this test can be used) for the duration of the COVID-19 declaration under Section 564(b)(1) of the Act, 21 U.S.C. section 360bbb-3(b)(1), unless the authorization is terminated or revoked.     Resp Syncytial Virus by PCR NEGATIVE NEGATIVE Final    Comment: (NOTE) Fact Sheet for Patients: BloggerCourse.com  Fact Sheet for Healthcare Providers: SeriousBroker.it  This test is not yet approved or cleared by the [place]  FDA and has been authorized for detection and/or diagnosis of SARS-CoV-2 by FDA under an Emergency Use Authorization (EUA). This EUA will remain in effect (meaning this test can be used) for the duration of the COVID-19 declaration under Section 564(b)(1) of the Act, 21 U.S.C. section 360bbb-3(b)(1), unless the authorization is terminated or revoked.  Performed at [facility], [address], [place], [place] [address]      Radiology Studies: [facility] Chest [place] 1 View Result Date: 11/07/2023 CLINICAL DATA:  [id number] Chest pain [id number] EXAM: PORTABLE CHEST - 1 VIEW COMPARISON:  11/04/2023 FINDINGS: Right lateral pleural thickening and blunting of the right lateral costophrenic angle. Left retrocardiac atelectasis/consolidation. Heart size and mediastinal contours are within normal limits. Aortic Atherosclerosis (ICD10-170.0). Progressive mild thoracic dextroscoliosis. IMPRESSION: 1. Left retrocardiac atelectasis/consolidation. 2. Right lateral pleural thickening and blunting of the right lateral  costophrenic angle. Electronically Signed   By: Nicoletta Barrier M.D.   On: 11/07/2023 14:06    Scheduled Meds:  amLODipine   5 mg Oral BID   apixaban   5 mg Oral BID   azithromycin   500 mg Oral Daily   budesonide  (PULMICORT ) nebulizer solution  0.5 mg Nebulization Daily   budesonide -glycopyrrolate -formoterol   2 puff Inhalation BID   carvedilol   12.5 mg Oral BID   cyanocobalamin   1,000 mcg Oral Daily   ferrous sulfate   325 mg Oral QPM   furosemide   40 mg Oral BID   insulin  aspart  0-9 Units Subcutaneous TID WC   insulin  glargine-yfgn  10 Units Subcutaneous Daily   ipratropium-albuterol   3 mL Nebulization BID   pantoprazole   40 mg Oral Daily  rosuvastatin   10 mg Oral QHS   sodium bicarbonate   1,300 mg Oral TID   sodium chloride  flush  3 mL Intravenous Q12H   Continuous Infusions:  magnesium  sulfate bolus IVPB       LOS: 2 days  MDM: Patient is high risk for one or more organ failure.  They necessitate ongoing hospitalization for continued IV therapies and subsequent lab monitoring. Total time spent interpreting labs and vitals, reviewing the medical record, coordinating care amongst consultants and care team members, directly assessing and discussing care with the patient and/or family: 55 min  Tinlee Navarrette, DO [facility]  To contact the attending physician between 7A-7P please use Epic Chat. To contact the covering physician during after hours 7P-7A, please review Amion.  11/08/2023, 8:26 AM   *This document has been created with the assistance of dictation software. Please excuse typographical errors. *

## 2023-11-08 NOTE — Consult Note (Signed)
 PHARMACY CONSULT NOTE - ELECTROLYTES  Pharmacy Consult for Electrolyte Monitoring and Replacement   Recent Labs: Height: 5\' 11"  (180.3 cm) Weight: 85.4 kg (188 lb 4.4 oz) IBW/kg (Calculated) : 75.3 Estimated Creatinine Clearance: 16 mL/min (A) (by C-G formula based on SCr of 4.58 mg/dL (H)).  Potassium (mmol/L)  Date Value  11/08/2023 4.1  03/06/2013 3.9   Magnesium  (mg/dL)  Date Value  96/09/5407 1.8  12/11/2012 1.8   Calcium  (mg/dL)  Date Value  81/19/1478 7.4 (L)   Calcium , Total (mg/dL)  Date Value  29/56/2130 9.3   Albumin  (g/dL)  Date Value  86/57/8469 2.4 (L)  07/15/2021 3.4 (L)  03/06/2013 3.4   Phosphorus (mg/dL)  Date Value  62/95/2841 4.5   Sodium (mmol/L)  Date Value  11/08/2023 135  07/15/2021 136  03/06/2013 138   Assessment  Larry Callahan is a 68 y.o. male presenting with shortness or breath (HF exacerbation). PMH significant for  HFrEF with last EF of 40% secondary to nonischemic cardiomyopathy, atrial flutter s/p ablation on Eliquis , CKD stage IV, hypertension, type 2 diabetes, COPD, OSA, hyperlipidemia, nonobstructive CAD, and CVA. Pharmacy has been consulted to monitor and replace electrolytes.  Diet: heart healthy/carb modified MIVF: IV lock Pertinent medications: sodium bicarb 1300mg  TID, furosemide  40 mg PO BID   Goal of Therapy: Electrolytes WNL  Plan:  K+ 4.1 s/p KCl 20 mEq PO x 1 and transition of IV Lasix  to PO Mag 1.8 - will order magnesium  sulfate 2 gm IV x 1 dose  Check electrolytes with AM labs  Pansy Bogus, PharmD Pharmacy Resident  11/08/2023 7:18 AM

## 2023-11-08 NOTE — Progress Notes (Signed)
 Occupational Therapy Treatment Patient Details Name: Larry Callahan MRN: [id number] DOB: [birth year] Today's Date: 11/08/2023   History of present illness Pt is a 68 y/o male admitted secondary to SOB and found to have acute on chronic CHF, AKI and COPD exacerbation. PMH including but not limited to HFrEF with last EF of 40% secondary to nonischemic cardiomyopathy, atrial flutter s/p ablation on Eliquis , CKD stage IV, hypertension, type 2 diabetes, COPD, OSA, hyperlipidemia, nonobstructive CAD, CVA.   OT comments  Upon entering the room, pt supine in bed and needing heavy encouragement for participation. Pt seen in late afternoon and reports having asked and taken a"sleeping pill". Pt encouraged to participate in therapy. He performed bed mobility to EOB with supervision and then began to fall asleep and needing min A for balance. Pt declined any standing or transfer attempts and ultimately too lethargic to continue. Pt returning to supine and kpad placed under L shoulder per pt's request. Call bell and all needed items within reach upon exiting the room.       If plan is discharge home, recommend the following:  Supervision due to cognitive status;Assistance with cooking/housework;A little help with bathing/dressing/bathroom;A little help with walking and/or transfers;Direct supervision/assist for financial management;Direct supervision/assist for medications management   Equipment Recommendations  None recommended by OT       Precautions / Restrictions Precautions Precautions: Fall Restrictions Weight Bearing Restrictions Per Provider Order: No       Mobility Bed Mobility Overal bed mobility: Needs Assistance Bed Mobility: Supine to Sit, Sit to Supine     Supine to sit: Supervision Sit to supine: Supervision        Transfers                   General transfer comment: Pt refused     Balance Overall balance assessment: Needs assistance Sitting-balance  support: Feet supported Sitting balance-Leahy Scale: Good                                     ADL either performed or assessed with clinical judgement    Extremity/Trunk Assessment Upper Extremity Assessment Upper Extremity Assessment: Generalized weakness   Lower Extremity Assessment Lower Extremity Assessment: Generalized weakness                 Communication Communication Communication: Impaired Factors Affecting Communication: Hearing impaired   Cognition Arousal: Alert Behavior During Therapy: Flat affect Cognition: No apparent impairments                               Following commands: Impaired Following commands impaired: Follows one step commands with increased time, Follows multi-step commands with increased time      Cueing   Cueing Techniques: Verbal cues             Pertinent Vitals/ Pain       Pain Assessment Pain Assessment: 0-10 Pain Score: 8  Pain Location: L shoulder Pain Descriptors / Indicators: Aching, Discomfort, Sore Pain Intervention(s): Limited activity within patient's tolerance, Monitored during session, Repositioned, Heat applied         Frequency  Min 2X/week        Progress Toward Goals  OT Goals(current goals can now be found in the care plan section)  Progress towards OT goals: OT to reassess next treatment      AM-PAC  OT "6 Clicks" Daily Activity     Outcome Measure   Help from another person eating meals?: None Help from another person taking care of personal grooming?: None Help from another person toileting, which includes using toliet, bedpan, or urinal?: A Little Help from another person bathing (including washing, rinsing, drying)?: A Little Help from another person to put on and taking off regular upper body clothing?: None Help from another person to put on and taking off regular lower body clothing?: None 6 Click Score: 22    End of Session    OT Visit Diagnosis: Other  abnormalities of gait and mobility (R26.89);Unsteadiness on feet (R26.81)   Activity Tolerance Patient limited by pain   Patient Left with call bell/phone within reach;in chair;with chair alarm set   Nurse Communication Mobility status        Time: 4696-2952 OT Time Calculation (min): 16 min  Charges: OT General Charges $OT Visit: 1 Visit OT Treatments $Therapeutic Activity: 8-22 mins  George Kinder, MS, OTR/L , CBIS ascom [phone]  11/08/23, 4:50 PM

## 2023-11-09 DIAGNOSIS — J438 Other emphysema: Secondary | ICD-10-CM | POA: Diagnosis not present

## 2023-11-09 DIAGNOSIS — I5023 Acute on chronic systolic (congestive) heart failure: Secondary | ICD-10-CM | POA: Diagnosis not present

## 2023-11-09 DIAGNOSIS — N179 Acute kidney failure, unspecified: Secondary | ICD-10-CM | POA: Diagnosis not present

## 2023-11-09 DIAGNOSIS — I509 Heart failure, unspecified: Secondary | ICD-10-CM | POA: Diagnosis not present

## 2023-11-09 LAB — GLUCOSE, CAPILLARY
Glucose-Capillary: 216 mg/dL — ABNORMAL HIGH (ref 70–99)
Glucose-Capillary: 263 mg/dL — ABNORMAL HIGH (ref 70–99)
Glucose-Capillary: 323 mg/dL — ABNORMAL HIGH (ref 70–99)

## 2023-11-09 LAB — BASIC METABOLIC PANEL WITH GFR
Anion gap: 11 (ref 5–15)
BUN: 108 mg/dL — ABNORMAL HIGH (ref 8–23)
CO2: 22 mmol/L (ref 22–32)
Calcium: 7.9 mg/dL — ABNORMAL LOW (ref 8.9–10.3)
Chloride: 103 mmol/L (ref 98–111)
Creatinine, Ser: 4.86 mg/dL — ABNORMAL HIGH (ref 0.61–1.24)
GFR, Estimated: 12 mL/min — ABNORMAL LOW (ref >60)
Glucose, Bld: 291 mg/dL — ABNORMAL HIGH (ref 70–99)
Potassium: 4.6 mmol/L (ref 3.5–5.1)
Sodium: 136 mmol/L (ref 135–145)

## 2023-11-09 LAB — MAGNESIUM: Magnesium: 2.4 mg/dL (ref 1.7–2.4)

## 2023-11-09 MED ORDER — HYDROCODONE-ACETAMINOPHEN 5-325 MG PO TABS
1.0000 | ORAL_TABLET | Freq: Four times a day (QID) | ORAL | Status: DC | PRN
  Administered 2023-11-09 – 2023-11-10 (×2): 1 via ORAL
  Filled 2023-11-09 (×2): qty 1

## 2023-11-09 NOTE — Progress Notes (Signed)
 Physical Therapy Treatment Patient Details Name: Larry Callahan MRN: [id number] DOB: Sep 28, 1955 Today's Date: 11/09/2023   History of Present Illness Pt is a 68 y/o male admitted secondary to SOB and found to have acute on chronic CHF, AKI and COPD exacerbation. PMH including but not limited to HFrEF with last EF of 40% secondary to nonischemic cardiomyopathy, atrial flutter s/p ablation on Eliquis , CKD stage IV, hypertension, type 2 diabetes, COPD, OSA, hyperlipidemia, nonobstructive CAD, CVA.    PT Comments  Pt pleasant and eager to ambulate. He ultimately did well ambulating ~900 ft with the walker (~40 ft w/o UE, not nearly as safe or consistent).  Pt with no fatigued during prolonged effort, did need consistent cuing (as well as a walker height adjustment) to keep inside the walker and maintain upright posture. Pt overall doing well, will benefit from continued PT to address functional limitations and work back to baseline function.     If plan is discharge home, recommend the following: A little help with walking and/or transfers;A little help with bathing/dressing/bathroom;Assistance with cooking/housework;Supervision due to cognitive status;Assist for transportation   Can travel by private vehicle     Yes  Equipment Recommendations  None recommended by PT    Recommendations for Other Services       Precautions / Restrictions Precautions Precautions: Fall Restrictions Weight Bearing Restrictions Per Provider Order: No     Mobility  Bed Mobility Overal bed mobility: Modified Independent Bed Mobility: Sit to Supine       Sit to supine: Supervision   General bed mobility comments: able to get himself back to bed from sitting w/o assist    Transfers Overall transfer level: Modified independent Equipment used: Rolling walker (2 wheels) Transfers: Sit to/from Stand Sit to Stand: Supervision           General transfer comment: Pt showed good confidence rising  to standing from recliner, UE use w/o need for any phyiscal assist.    Ambulation/Gait Ambulation/Gait assistance: Supervision Gait Distance (Feet): 900 Feet Assistive device: Rolling walker (2 wheels)         General Gait Details: Pt tending to lean forward and push walker ahead, able to make some short term adjustments but consistently defaulting to poor position despite consistent cuing.  PT adjusted walker up and this did result in more consistently appropriate posturing/positioning.  Pt's VSS despite a very prolonged ambulation effort.  trial of ~40 ft w/o UE support, no LOBs but clearly less steady, pt agrees RW use is appropriate at this time   Optometrist     Tilt Bed    Modified Rankin (Stroke Patients Only)       Balance Overall balance assessment: Needs assistance Sitting-balance support: Feet supported Sitting balance-Leahy Scale: Good     Standing balance support: During functional activity, Single extremity supported, Bilateral upper extremity supported Standing balance-Leahy Scale: Good Standing balance comment: with b/l UE support                            Communication    Cognition Arousal: Alert Behavior During Therapy: Flat affect   PT - Cognitive impairments: Awareness, Memory, Attention, Safety/Judgement                         Following commands: Intact      Cueing Cueing Techniques: Verbal cues  Exercises      General Comments        Pertinent Vitals/Pain Pain Assessment Pain Assessment: 0-10 Pain Score: 1  Pain Location: tail bone, chronic neck    Home Living                          Prior Function            PT Goals (current goals can now be found in the care plan section) Progress towards PT goals: Progressing toward goals    Frequency    Min 1X/week      PT Plan      Co-evaluation              AM-PAC PT "6 Clicks" Mobility   Outcome  Measure  Help needed turning from your back to your side while in a flat bed without using bedrails?: None Help needed moving from lying on your back to sitting on the side of a flat bed without using bedrails?: None Help needed moving to and from a bed to a chair (including a wheelchair)?: None Help needed standing up from a chair using your arms (e.g., wheelchair or bedside chair)?: None Help needed to walk in hospital room?: A Little Help needed climbing 3-5 steps with a railing? : A Little 6 Click Score: 22    End of Session Equipment Utilized During Treatment: Gait belt Activity Tolerance: Patient tolerated treatment well Patient left: in bed;with call bell/phone within reach;Other (comment) Nurse Communication: Mobility status PT Visit Diagnosis: Other abnormalities of gait and mobility (R26.89)     Time: 4098-1191 PT Time Calculation (min) (ACUTE ONLY): 28 min  Charges:    $Gait Training: 23-37 mins PT General Charges $$ ACUTE PT VISIT: 1 Visit                     Darice Edelman, DPT 11/09/2023, 4:01 PM

## 2023-11-09 NOTE — Consult Note (Signed)
 PHARMACY CONSULT NOTE - ELECTROLYTES  Pharmacy Consult for Electrolyte Monitoring and Replacement   Recent Labs: Height: 5\' 11"  (180.3 cm) Weight: 86.6 kg (190 lb 14.7 oz) IBW/kg (Calculated) : 75.3 Estimated Creatinine Clearance: 15.1 mL/min (A) (by C-G formula based on SCr of 4.86 mg/dL (H)).  Potassium (mmol/L)  Date Value  11/09/2023 4.6  03/06/2013 3.9   Magnesium  (mg/dL)  Date Value  46/96/2952 2.4  12/11/2012 1.8   Calcium  (mg/dL)  Date Value  84/13/2440 7.9 (L)   Calcium , Total (mg/dL)  Date Value  04/09/2535 9.3   Albumin  (g/dL)  Date Value  64/40/3474 2.4 (L)  07/15/2021 3.4 (L)  03/06/2013 3.4   Phosphorus (mg/dL)  Date Value  25/95/6387 4.5   Sodium (mmol/L)  Date Value  11/09/2023 136  07/15/2021 136  03/06/2013 138   Corrected calcium : 9.18 mg/dL   Assessment  Larry Callahan is a 68 y.o. male presenting with shortness or breath (HF exacerbation). PMH significant for  HFrEF with last EF of 40% secondary to nonischemic cardiomyopathy, atrial flutter s/p ablation on Eliquis , CKD stage IV, hypertension, type 2 diabetes, COPD, OSA, hyperlipidemia, nonobstructive CAD, and CVA. Pharmacy has been consulted to monitor and replace electrolytes.  Diet: heart healthy/carb modified MIVF: IV lock Pertinent medications: sodium bicarb 1300mg  TID, furosemide  40 mg PO BID   Goal of Therapy: Electrolytes WNL  Plan:   No electrolyte replacement indicated at this time  Check electrolytes with AM labs  Pansy Bogus, PharmD Pharmacy Resident  11/09/2023 7:08 AM

## 2023-11-09 NOTE — Consult Note (Signed)
 [facility]  CONSULT NOTE    Date: 11/09/2023                  Patient Name:  Tyrian Peart Vossler  MRN: [id number]  DOB: 1956-01-07  Age / Sex: 68 y.o., male         PCP: Laurence Pons, NP                 Service Requesting Consult: TRH                 Reason for Consult: Acute kidney injury            History of Present Illness: Mr. Xavyer Steenson is a 68 y.o.  male with past medical conditions including CHF, COPD, CAD, stroke, diabetes, CKDIV and hypertension, who was admitted to [facility] on 11/04/2023 for COPD exacerbation (HCC) [J44.1] AKI (acute kidney injury) (HCC) [N17.9] Acute on chronic HFrEF (heart failure with reduced ejection fraction) (HCC) [I50.23] Acute on chronic congestive heart failure, unspecified heart failure type (HCC) [I50.9] CHF exacerbation (HCC) [I50.9]  Patient is seen resting in bed. No family present. States he has been having progressive shortness of breath for about a week or more. No known fever or chills. Denies GI symptoms. Continued to take medications during this time. States it became difficult to ambulate with his walker due to shortness of breath and leg swelling.   Creatinine on ED arrival 5.22, initially trended down. Then went up slightly to day to 4.86. Baseline creatinine appears to be around 4 with GFR 16. Chest xray shows vascular congestion on admission. Patient was initially treated with IV lasix .    Medications: Outpatient medications: Medications Prior to Admission  Medication Sig Dispense Refill Last Dose/Taking   acetaminophen  (TYLENOL ) 325 MG tablet Take 2 tablets (650 mg total) by mouth every 6 (six) hours as needed for mild pain or moderate pain (or Fever >/= 101).   Taking As Needed   albuterol  (VENTOLIN  HFA) 108 (90 Base) MCG/ACT inhaler Inhale 1-2 puffs into the lungs every 4 (four) hours as needed for wheezing or shortness of breath. 18 g 0 Taking As Needed   amLODipine  (NORVASC ) 5 MG  tablet Take 1 tablet (5 mg total) by mouth 2 (two) times daily. Skip the dose if systolic BP less than 140 mmHg 180 tablet 3 Past Week   apixaban  (ELIQUIS ) 5 MG TABS tablet Take 1 tablet (5 mg total) by mouth 2 (two) times daily. 60 tablet 5 Past Week   budeson-glycopyrrolate -formoterol  (BREZTRI  AEROSPHERE) 160-9-4.8 MCG/ACT AERO inhaler Inhale 2 puffs into the lungs 2 (two) times daily. 2 each 3 Past Week   carvedilol  (COREG ) 12.5 MG tablet Take 1 tablet (12.5 mg total) by mouth 2 (two) times daily. 180 tablet 3 Past Week   cyanocobalamin  1000 MCG tablet Take 1 tablet (1,000 mcg total) by mouth daily. 90 tablet 1 Past Week   diphenhydrAMINE  (BENADRYL ) 25 mg capsule Take 1 capsule (25 mg total) by mouth every 8 (eight) hours as needed for itching. 30 capsule 0 Past Week   ferrous sulfate  325 (65 FE) MG tablet Take 325 mg by mouth every evening.   Past Week   furosemide  (LASIX ) 40 MG tablet Take 1 tablet (40 mg total) by mouth daily. 90 tablet 3 Past Week   hydrALAZINE  (APRESOLINE ) 100 MG tablet Take 1 tablet (100 mg total) by mouth 3 (three) times daily as needed (SBP >160). 100 tablet 1 Past Week   insulin   glargine, 2 Unit Dial , (TOUJEO  MAX SOLOSTAR) 300 UNIT/ML Solostar Pen Inject 10 Units into the skin at bedtime.   Past Week   Insulin  lispro (HUMALOG  JUNIOR KWIKPEN) 100 UNIT/ML Inject 0-10 Units into the skin 3 (three) times daily.   Past Week   ipratropium-albuterol  (DUONEB) 0.5-2.5 (3) MG/3ML SOLN Take 3 mLs by nebulization every 6 (six) hours as needed. 270 mL 0 Taking As Needed   methocarbamol  (ROBAXIN ) 750 MG tablet Take 1 tablet (750 mg total) by mouth every 6 (six) hours as needed for muscle spasms. 120 tablet 1 Taking As Needed   pantoprazole  (PROTONIX ) 40 MG tablet Take 40 mg by mouth 2 (two) times daily.   Past Week   polyethylene glycol powder (GLYCOLAX /MIRALAX ) 17 GM/SCOOP powder Take 17 g by mouth daily. 238 g 0 Past Week   potassium chloride  SA (KLOR-CON  M) 20 MEQ tablet Take 1  tablet (20 mEq total) by mouth daily.   Past Week   rosuvastatin  (CRESTOR ) 10 MG tablet Take 1 tablet (10 mg total) by mouth at bedtime. 90 tablet 1 Past Week   Skin Protectants, Misc. (PERIGUARD EX) Apply 1 Application topically in the morning, at noon, and at bedtime.   Past Week   sodium bicarbonate  650 MG tablet Take 2 tablets (1,300 mg total) by mouth 3 (three) times daily for 14 days, THEN 1 tablet (650 mg total) 3 (three) times daily. 354 tablet 0 Past Week   ACCU-CHEK GUIDE test strip Use to check blood sugars 4 times a day E11.65 400 strip 0    Accu-Chek Softclix Lancets lancets Use to check blood sugars 4 times a day   E11.65 400 each 3    Blood Glucose Monitoring Suppl (TRUE METRIX AIR GLUCOSE METER) DEVI 1 Device by Does not apply route 2 (two) times daily. 1 Device 0    Insulin  Pen Needle (NOVOFINE PEN NEEDLE) 32G X 6 MM MISC Use as directed with toujeo  pen 100 each 5     Current medications: Current Facility-Administered Medications  Medication Dose Route Frequency Provider Last Rate Last Admin   acetaminophen  (TYLENOL ) tablet 650 mg  650 mg Oral Q4H PRN Basaraba, Iulia, MD   650 mg at 11/09/23 0816   amLODipine  (NORVASC ) tablet 5 mg  5 mg Oral BID Basaraba, Iulia, MD   5 mg at 11/09/23 0810   apixaban  (ELIQUIS ) tablet 5 mg  5 mg Oral BID Avi Body, MD   5 mg at 11/09/23 1610   budesonide  (PULMICORT ) nebulizer solution 0.5 mg  0.5 mg Nebulization Daily Evette Hoes L, MD   0.5 mg at 11/09/23 9604   budesonide -glycopyrrolate -formoterol  (BREZTRI ) 160-9-4.8 MCG/ACT inhaler 2 puff  2 puff Inhalation BID Avi Body, MD   2 puff at 11/09/23 5409   carvedilol  (COREG ) tablet 12.5 mg  12.5 mg Oral BID Basaraba, Iulia, MD   12.5 mg at 11/09/23 0810   cyanocobalamin  (VITAMIN B12) tablet 1,000 mcg  1,000 mcg Oral Daily Avi Body, MD   1,000 mcg at 11/09/23 0810   ferrous sulfate  tablet 325 mg  325 mg Oral QPM Avi Body, MD   325 mg at 11/08/23 1743   furosemide   (LASIX ) tablet 40 mg  40 mg Oral BID Ree Candy, MD   40 mg at 11/09/23 0810   HYDROcodone -acetaminophen  (NORCO/VICODIN) 5-325 MG per tablet 1 tablet  1 tablet Oral Q6H PRN Dezii, Alexandra, DO   1 tablet at 11/09/23 1202   insulin  aspart (novoLOG ) injection 0-9 Units  0-9 Units  Subcutaneous TID WC Basaraba, Iulia, MD   5 Units at 11/09/23 1202   insulin  glargine-yfgn (SEMGLEE ) injection 10 Units  10 Units Subcutaneous Daily Ree Candy, MD   10 Units at 11/09/23 0956   ipratropium-albuterol  (DUONEB) 0.5-2.5 (3) MG/3ML nebulizer solution 3 mL  3 mL Nebulization BID Dezii, Alexandra, DO   3 mL at 11/09/23 0739   melatonin tablet 5 mg  5 mg Oral QHS PRN Duncan, Hazel V, MD   5 mg at 11/07/23 2158   ondansetron  (ZOFRAN ) injection 4 mg  4 mg Intravenous Q6H PRN Basaraba, Iulia, MD       pantoprazole  (PROTONIX ) EC tablet 40 mg  40 mg Oral Daily Ree Candy, MD   40 mg at 11/09/23 0810   predniSONE  (DELTASONE ) tablet 20 mg  20 mg Oral Q breakfast Dezii, Alexandra, DO   20 mg at 11/09/23 0810   rosuvastatin  (CRESTOR ) tablet 10 mg  10 mg Oral QHS Basaraba, Iulia, MD   10 mg at 11/08/23 2203   sodium bicarbonate  tablet 1,300 mg  1,300 mg Oral TID Basaraba, Iulia, MD   1,300 mg at 11/09/23 0810   sodium chloride  flush (NS) 0.9 % injection 3 mL  3 mL Intravenous Q12H Avi Body, MD   3 mL at 11/09/23 0811   sodium chloride  flush (NS) 0.9 % injection 3 mL  3 mL Intravenous PRN Avi Body, MD          Allergies: Allergies  Allergen Reactions   Penicillins Rash, Dermatitis, Hives and Other (See Comments)    Other reaction(s): HIVES   Codeine  Nausea And Vomiting      Past Medical History: Past Medical History:  Diagnosis Date   Allergy    Atrial flutter (HCC)    a. Dx 12/2020--CHA2DS2VASc = 5-->eliquis .   Chronic combined systolic (congestive) and diastolic (congestive) heart failure (HCC)    a. 04/2019 Echo: EF 45-50%; b. 02/2019 Echo: EF 55-60%; c. 09/2020 Echo: EF  35-40%, glob HK. Nl RV size/fxn. Mild MR; d. 07/2021 Echo: EF 40-45%, mild LVH, low-nl RV fxn, no MR.   CKD (chronic kidney disease), stage III (HCC)    COPD (chronic obstructive pulmonary disease) (HCC)    Coronary artery disease    a. 2011 Cath: nonobs dzs; b. 09/2020 MV: EF 38%. No signif ischemia. ? inf MI vs diaph attenuation. GI uptake artifact.   Diabetes mellitus without complication (HCC)    GERD (gastroesophageal reflux disease)    History of CVA (cerebrovascular accident) 09/25/2020   Hyperlipidemia    Hypertension    Morbid (severe) obesity due to excess calories (HCC) 06/09/2017   Morbid obesity (HCC)    NICM (nonischemic cardiomyopathy) (HCC)    a. 04/2019 Echo: EF 45-50%; b. 02/2019 Echo: EF 55-60%; c. 09/2020 Echo: EF 35-40%, glob HK; d. 09/2020 MV: No ischemia. ? inf infarct vs attenuation; d. 07/2021 Echo: EF 40-45%.   OSA (obstructive sleep apnea)    Pneumonia due to COVID-19 virus 01/30/2020   Stage 3b chronic kidney disease (HCC)    Stroke (HCC) 2024   Syncope and collapse 11/16/2020     Past Surgical History: Past Surgical History:  Procedure Laterality Date   CARDIAC CATHETERIZATION     CHOLECYSTECTOMY     COLONOSCOPY WITH PROPOFOL  N/A 04/12/2019   Procedure: COLONOSCOPY WITH PROPOFOL ;  Surgeon: Selena Daily, MD;  Location: [name] ENDOSCOPY;  Service: Gastroenterology;  Laterality: N/A;   ESOPHAGOGASTRODUODENOSCOPY N/A 04/12/2019   Procedure: ESOPHAGOGASTRODUODENOSCOPY (EGD);  Surgeon: Vanga, Rohini  Kieran Pellet, MD;  Location: [facility] ENDOSCOPY;  Service: Gastroenterology;  Laterality: N/A;   ESOPHAGOGASTRODUODENOSCOPY (EGD) WITH PROPOFOL  N/A 11/21/2019   Procedure: ESOPHAGOGASTRODUODENOSCOPY (EGD) WITH PROPOFOL ;  Surgeon: Selena Daily, MD;  Location: [name] ENDOSCOPY;  Service: Gastroenterology;  Laterality: N/A;   ESOPHAGOGASTRODUODENOSCOPY (EGD) WITH PROPOFOL  N/A 01/11/2022   Procedure: ESOPHAGOGASTRODUODENOSCOPY (EGD) WITH PROPOFOL ;  Surgeon: Marnee Sink, MD;   Location: [name] ENDOSCOPY;  Service: Endoscopy;  Laterality: N/A;   ESOPHAGOGASTRODUODENOSCOPY (EGD) WITH PROPOFOL  N/A 08/02/2022   Procedure: ESOPHAGOGASTRODUODENOSCOPY (EGD) WITH PROPOFOL ;  Surgeon: Marnee Sink, MD;  Location: [name] ENDOSCOPY;  Service: Endoscopy;  Laterality: N/A;   FLEXIBLE BRONCHOSCOPY Bilateral 05/17/2019   Procedure: FLEXIBLE BRONCHOSCOPY;  Surgeon: Cordie Deters, MD;  Location: [name] ORS;  Service: Pulmonary;  Laterality: Bilateral;   left arm surgery     nephrectomy Left    PARATHYROIDECTOMY     RIGHT HEART CATH N/A 03/29/2019   Procedure: RIGHT HEART CATH;  Surgeon: Devorah Fonder, MD;  Location: [name] INVASIVE CV LAB;  Service: Cardiovascular;  Laterality: N/A;     Family History: Family History  Problem Relation Age of Onset   Diabetes Mother        2003   Lung cancer Father    Diabetes Sister    Hypertension Sister    Heart disease Sister    Cancer Sister    Bone cancer Brother    Prostate cancer Neg Hx    Bladder Cancer Neg Hx    Kidney cancer Neg Hx      Social History: Social History   Socioeconomic History   Marital status: Single    Spouse name: Not on file   Number of children: Not on file   Years of education: Not on file   Highest education level: Not on file  Occupational History   Not on file  Tobacco Use   Smoking status: Every Day    Current packs/day: 0.00    Types: Cigarettes    Last attempt to quit: 03/13/2020    Years since quitting: 3.6   Smokeless tobacco: Former    Types: Engineer, drilling   Vaping status: Never Used  Substance and Sexual Activity   Alcohol use: No   Drug use: No   Sexual activity: Not Currently  Other Topics Concern   Not on file  Social History Narrative   Not on file   Social Drivers of Health   Financial Resource Strain: Low Risk  (05/12/2021)   Overall Financial Resource Strain (CARDIA)    Difficulty of Paying Living Expenses: Not very hard  Food Insecurity: No Food Insecurity  (11/04/2023)   Hunger Vital Sign    Worried About Running Out of Food in the Last Year: Never true    Ran Out of Food in the Last Year: Never true  Transportation Needs: No Transportation Needs (11/04/2023)   PRAPARE - Administrator, Civil Service (Medical): No    Lack of Transportation (Non-Medical): No  Physical Activity: Not on file  Stress: Not on file  Social Connections: Unknown (11/04/2023)   Social Connection and Isolation Panel [NHANES]    Frequency of Communication with Friends and Family: More than three times a week    Frequency of Social Gatherings with Friends and Family: More than three times a week    Attends Religious Services: Patient declined    Active Member of Clubs or Organizations: Patient declined    Attends Banker Meetings: Patient declined  Marital Status: Widowed  Intimate Partner Violence: Not At Risk (11/04/2023)   Humiliation, Afraid, Rape, and Kick questionnaire    Fear of Current or Ex-Partner: No    Emotionally Abused: No    Physically Abused: No    Sexually Abused: No     Review of Systems: Review of Systems  Constitutional:  Negative for chills, fever and malaise/fatigue.  HENT:  Negative for congestion, sore throat and tinnitus.   Eyes:  Negative for blurred vision and redness.  Respiratory:  Positive for shortness of breath. Negative for cough and wheezing.   Cardiovascular:  Positive for leg swelling. Negative for chest pain, palpitations and claudication.  Gastrointestinal:  Negative for abdominal pain, blood in stool, diarrhea, nausea and vomiting.  Genitourinary:  Negative for flank pain, frequency and hematuria.  Musculoskeletal:  Negative for back pain, falls and myalgias.  Skin:  Negative for rash.  Neurological:  Negative for dizziness, weakness and headaches.  Endo/Heme/Allergies:  Does not bruise/bleed easily.  Psychiatric/Behavioral:  Negative for depression. The patient is not nervous/anxious and does not  have insomnia.     Vital Signs: Blood pressure (!) 140/79, pulse 70, temperature 97.6 F (36.4 C), resp. rate 16, height 5\' 11"  (1.803 m), weight 86.6 kg, SpO2 98%.  Weight trends: Filed Weights   11/06/23 0500 11/07/23 0500 11/09/23 0403  Weight: 85.4 kg 85.4 kg 86.6 kg    Physical Exam: General: NAD,   Head: Normocephalic, atraumatic. Moist oral mucosal membranes  Eyes: Anicteric  Lungs:  Clear to auscultation  Heart: Regular rate and rhythm  Abdomen:  Soft, nontender,   Extremities:  No peripheral edema.  Neurologic: Nonfocal, moving all four extremities  Skin: No lesions        Lab results: Basic Metabolic Panel: Recent Labs  Lab 11/05/23 0404 11/06/23 0524 11/07/23 0516 11/08/23 0233 11/09/23 0414  NA 132* 131* 136 135 136  K 4.9 4.3 3.9 4.1 4.6  CL 104 102 105 101 103  CO2 17* 16* 22 23 22   GLUCOSE 397* 228* 157* 179* 291*  BUN 91* 95* 93* 99* 108*  CREATININE 5.13* 4.85* 4.64* 4.58* 4.86*  CALCIUM  7.6* 7.5* 7.4* 7.4* 7.9*  MG 2.1  --  2.0 1.8 2.4  PHOS 5.6* 5.5* 4.5  --   --     Liver Function Tests: Recent Labs  Lab 11/04/23 1545 11/06/23 0524  AST 24  --   ALT 12  --   ALKPHOS 68  --   BILITOT 0.7  --   PROT 6.6  --   ALBUMIN  2.5* 2.4*   No results for input(s): "LIPASE", "AMYLASE" in the last 168 hours. No results for input(s): "AMMONIA" in the last 168 hours.  CBC: Recent Labs  Lab 11/04/23 1545 11/05/23 0404 11/06/23 0524 11/07/23 0858  WBC 2.7* 1.3* 5.7 5.1  NEUTROABS 1.9 0.9*  --   --   HGB 8.1* 7.2* 7.0* 7.6*  HCT 26.2* 21.6* 22.1* 22.8*  MCV 93.9 90.0 89.1 88.0  PLT 165 172 189 216    Cardiac Enzymes: No results for input(s): "CKTOTAL", "CKMB", "CKMBINDEX", "TROPONINI" in the last 168 hours.  BNP: Invalid input(s): "POCBNP"  CBG: Recent Labs  Lab 11/08/23 1217 11/08/23 1621 11/08/23 2351 11/09/23 0727 11/09/23 1137  GLUCAP 271* 174* 254* 216* 263*    Microbiology: Results for orders placed or performed  during the hospital encounter of 11/04/23  Resp panel by RT-PCR (RSV, Flu A&B, Covid) Anterior Nasal Swab     Status: None  Collection Time: 11/04/23  3:45 PM   Specimen: Anterior Nasal Swab  Result Value Ref Range Status   SARS Coronavirus 2 by RT PCR NEGATIVE NEGATIVE Final    Comment: (NOTE) SARS-CoV-2 target nucleic acids are NOT DETECTED.  The SARS-CoV-2 RNA is generally detectable in upper respiratory specimens during the acute phase of infection. The lowest concentration of SARS-CoV-2 viral copies this assay can detect is 138 copies/mL. A negative result does not preclude SARS-Cov-2 infection and should not be used as the sole basis for treatment or other patient management decisions. A negative result may occur with  improper specimen collection/handling, submission of specimen other than nasopharyngeal swab, presence of viral mutation(s) within the areas targeted by this assay, and inadequate number of viral copies(<138 copies/mL). A negative result must be combined with clinical observations, patient history, and epidemiological information. The expected result is Negative.  Fact Sheet for Patients:  BloggerCourse.com  Fact Sheet for Healthcare Providers:  SeriousBroker.it  This test is no t yet approved or cleared by the [place]  FDA and  has been authorized for detection and/or diagnosis of SARS-CoV-2 by FDA under an Emergency Use Authorization (EUA). This EUA will remain  in effect (meaning this test can be used) for the duration of the COVID-19 declaration under Section 564(b)(1) of the Act, 21 U.S.C.section 360bbb-3(b)(1), unless the authorization is terminated  or revoked sooner.       Influenza A by PCR NEGATIVE NEGATIVE Final   Influenza B by PCR NEGATIVE NEGATIVE Final    Comment: (NOTE) The Xpert Xpress SARS-CoV-2/FLU/RSV plus assay is intended as an aid in the diagnosis of influenza from  Nasopharyngeal swab specimens and should not be used as a sole basis for treatment. Nasal washings and aspirates are unacceptable for Xpert Xpress SARS-CoV-2/FLU/RSV testing.  Fact Sheet for Patients: BloggerCourse.com  Fact Sheet for Healthcare Providers: SeriousBroker.it  This test is not yet approved or cleared by the [place]  FDA and has been authorized for detection and/or diagnosis of SARS-CoV-2 by FDA under an Emergency Use Authorization (EUA). This EUA will remain in effect (meaning this test can be used) for the duration of the COVID-19 declaration under Section 564(b)(1) of the Act, 21 U.S.C. section 360bbb-3(b)(1), unless the authorization is terminated or revoked.     Resp Syncytial Virus by PCR NEGATIVE NEGATIVE Final    Comment: (NOTE) Fact Sheet for Patients: BloggerCourse.com  Fact Sheet for Healthcare Providers: SeriousBroker.it  This test is not yet approved or cleared by the [place]  FDA and has been authorized for detection and/or diagnosis of SARS-CoV-2 by FDA under an Emergency Use Authorization (EUA). This EUA will remain in effect (meaning this test can be used) for the duration of the COVID-19 declaration under Section 564(b)(1) of the Act, 21 U.S.C. section 360bbb-3(b)(1), unless the authorization is terminated or revoked.  Performed at [facility], [address] [place], [place] [address]     Coagulation Studies: No results for input(s): "LABPROT", "INR" in the last 72 hours.  Urinalysis: No results for input(s): "COLORURINE", "LABSPEC", "PHURINE", "GLUCOSEU", "HGBUR", "BILIRUBINUR", "KETONESUR", "PROTEINUR", "UROBILINOGEN", "NITRITE", "LEUKOCYTESUR" in the last 72 hours.  Invalid input(s): "APPERANCEUR"    Imaging: No results found.   Assessment & Plan: Mr. Kailon Treese Salido is a 68 y.o.  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 AKI likely secondary to hemodynamic instability and agressive diuresis. Baseline creatinine appears to be 4 with GFR 16. Patient initially treated with IV Furosemide  but transitioned to oral. Currently held. Will evaluate labs in am. No acute need for dialysis.   2. Chronic systolic heart failure, ECHO from 11/24 has EF 40%. Treated with IV furosemide , then oral. Now held due to kidney injury.   3. Anemia of chronic kidney disease Lab Results  Component Value Date   HGB 7.6 (L) 11/07/2023    Hgb decreased. Will continue to monitor.   4. Diabetes mellitus type II with chronic kidney disease/renal manifestations: insulin  dependent. Home regimen includes Lantus  and humalog . Most recent hemoglobin A1c is 6.7 on 09/18/23.      LOS: 3 Joyann Spidle 5/29/20251:59 PM

## 2023-11-09 NOTE — Plan of Care (Signed)
  Problem: Health Behavior/Discharge Planning: Goal: Ability to manage health-related needs will improve Outcome: Progressing   Problem: Clinical Measurements: Goal: Will remain free from infection Outcome: Progressing Goal: Cardiovascular complication will be avoided Outcome: Progressing   Problem: Elimination: Goal: Will not experience complications related to bowel motility Outcome: Progressing

## 2023-11-09 NOTE — Care Management Important Message (Signed)
 Important Message  Patient Details  Name: Larry Callahan MRN: [id number] Date of Birth: 1955-10-12   Important Message Given:  Yes - Medicare IM     Caydance Kuehnle W, CMA 11/09/2023, 11:23 AM

## 2023-11-09 NOTE — Progress Notes (Signed)
 PROGRESS NOTE    Larry Callahan  BJY:[id number] DOB: [birth year] DOA: 11/04/2023 PCP: Laurence Pons, NP  Chief Complaint  Patient presents with   Shortness of Breath    Hospital Course:  Larry Callahan is a 68 year old male with heart failure with reduced EF 40% secondary to nonischemic cardiomyopathy, a flutter status post ablation on Eliquis , CKD stage IV, hypertension, type 2 diabetes, COPD, OSA, HLD, nonobstructive CAD, CVA, who presents to the ED with shortness of breath.  He was admitted for CHF exacerbation.  Patient also had recent admissions for COPD exacerbations with pneumonia.  Patient was started on IV and had excellent urine output.  He continued to have dyspnea and was started on treatment for COPD exacerbation.  Subjective: No acute events overnight.  Patient reports he is feeling somewhat improved today.  He believes he will be ready to discharge home tomorrow.   Objective: Vitals:   11/08/23 1934 11/08/23 1941 11/09/23 0403 11/09/23 0725  BP:  125/65 (!) 146/71 (!) 140/79  Pulse:  75 71 70  Resp:  16 12 16   Temp:  98.7 F (37.1 C) 97.7 F (36.5 C) 97.6 F (36.4 C)  TempSrc:   Oral   SpO2: 96% 100% 100% 98%  Weight:   86.6 kg   Height:        Intake/Output Summary (Last 24 hours) at 11/09/2023 1535 Last data filed at 11/09/2023 1020 Gross per 24 hour  Intake 240 ml  Output 1450 ml  Net -1210 ml   Filed Weights   11/06/23 0500 11/07/23 0500 11/09/23 0403  Weight: 85.4 kg 85.4 kg 86.6 kg    Examination: General exam: Appears calm and comfortable, NAD  Respiratory system: No work of breathing, symmetric chest wall expansion.  No wheezing today.  On room air cardiovascular system: S1 & S2 heard, RRR.  Gastrointestinal system: Abdomen is nondistended, soft and nontender.  Neuro: Alert and oriented. No focal neurological deficits. Extremities: Symmetric, expected ROM Skin: No rashes, lesions Psychiatry: Demonstrates appropriate judgement  and insight. Mood & affect appropriate for situation.   Assessment & Plan:  Principal Problem:   Acute on chronic HFrEF (heart failure with reduced ejection fraction) (HCC) Active Problems:   Acute kidney injury superimposed on chronic kidney disease (HCC)   COPD (chronic obstructive pulmonary disease) (HCC)   Essential hypertension   Type 2 diabetes mellitus (HCC)   Atrial flutter (HCC)   Metabolic acidosis, normal anion gap (NAG)   CHF exacerbation (HCC)   Acute on chronic heart failure with reduced EF - November 2024: EF 40%, suspected secondary to a flutter - BNP elevated above baseline - Pulmonary edema on exam on arrival, no lower extremity edema - Has been on IV Lasix , was transitioned to p.o. on 5/27 - Continue to follow strict I's and O's, output has slowed significantly since transitioning to p.o. - Appears clinically dry today.  BUN rising.  Will discontinue further Lasix  for now - Continue to monitor kidney function.  See below.  Back pain - Chronic.  Ongoing.  Likely MSK etiology - Continue with muscle relaxer, heating pad, out of bed frequently - As needed Tylenol  and Norco - EKG and troponin within normal limits.  Not likely cardiac  AKI superimposed on CKD stage IV - Baseline creatinine near 3.5, acute worsening on arrival to 5.2 - Patient is a poor historian, reports he has right kidney only due to " masses" on the other.  He does not follow with a nephrologist outpatient -  Creatinine is downtrending some but has not resolved to baseline.  Expect there will be slow resolution. - AKI this admission likely result of diuresis - Have consulted nephrology.  Patient will need to establish and have close outpatient follow-up.  Anticipate he will need hemodialysis in the future. - Continue daily trending of BMP - Have stopped Lasix  today - Avoid other nephrotoxic medications.  Headache, resolved - As needed Tylenol .  Status post Fioricet x 1  COPD, in  exacerbation -On room air - Continue with prednisone  x 5 days - Continue with scheduled DuoNebs - No wheezing today.  Improving.  Leukopenia - WBC 1.3 on arrival -WBC has recovered to normal range now - Patient remains afebrile - Continue to trend CBC  Metabolic acidosis, normal anion gap - Resume home dose bicarb  Anemia of chronic disease - Hemoglobin stable above 7 - No signs of active bleeding - Trend daily CBC  A flutter - Status post ablation [facility] in December 2024 - EKG: Junctional versus sinus rhythm, consistent with prior - Continue home meds  Type 2 diabetes - Hold home regimen for now - Continue with long-acting 10 units, sliding scale insulin  - Continue to follow CBGs.  Titrate insulin  needs daily.  Hypertension - Continue to follow closely.  Gradually resuming antihypertensives as BP allows  DVT prophylaxis: Eliquis    Code Status: Full Code Disposition:  Inpatient, pending resolution and kidney function and respiratory status.  Anticipate will be ready for discharge tomorrow  Consultants:  Treatment Team:  Consulting Physician: Lateef, Munsoor, MD  Procedures:    Antimicrobials:  Anti-infectives (From admission, onward)    Start     Dose/Rate Route Frequency Ordered Stop   11/07/23 1730  azithromycin  (ZITHROMAX ) tablet 500 mg  Status:  Discontinued        500 mg Oral Daily 11/07/23 1634 11/08/23 1318       Data Reviewed: I have personally reviewed following labs and imaging studies CBC: Recent Labs  Lab 11/04/23 1545 11/05/23 0404 11/06/23 0524 11/07/23 0858  WBC 2.7* 1.3* 5.7 5.1  NEUTROABS 1.9 0.9*  --   --   HGB 8.1* 7.2* 7.0* 7.6*  HCT 26.2* 21.6* 22.1* 22.8*  MCV 93.9 90.0 89.1 88.0  PLT 165 172 189 216   Basic Metabolic Panel: Recent Labs  Lab 11/05/23 0404 11/06/23 0524 11/07/23 0516 11/08/23 0233 11/09/23 0414  NA 132* 131* 136 135 136  K 4.9 4.3 3.9 4.1 4.6  CL 104 102 105 101 103  CO2 17* 16* 22 23 22   GLUCOSE 397*  228* 157* 179* 291*  BUN 91* 95* 93* 99* 108*  CREATININE 5.13* 4.85* 4.64* 4.58* 4.86*  CALCIUM  7.6* 7.5* 7.4* 7.4* 7.9*  MG 2.1  --  2.0 1.8 2.4  PHOS 5.6* 5.5* 4.5  --   --    GFR: Estimated Creatinine Clearance: 15.1 mL/min (A) (by C-G formula based on SCr of 4.86 mg/dL (H)). Liver Function Tests: Recent Labs  Lab 11/04/23 1545 11/06/23 0524  AST 24  --   ALT 12  --   ALKPHOS 68  --   BILITOT 0.7  --   PROT 6.6  --   ALBUMIN  2.5* 2.4*   CBG: Recent Labs  Lab 11/08/23 1217 11/08/23 1621 11/08/23 2351 11/09/23 0727 11/09/23 1137  GLUCAP 271* 174* 254* 216* 263*    Recent Results (from the past 240 hours)  Resp panel by RT-PCR (RSV, Flu A&B, Covid) Anterior Nasal Swab     Status: None  Collection Time: 11/04/23  3:45 PM   Specimen: Anterior Nasal Swab  Result Value Ref Range Status   SARS Coronavirus 2 by RT PCR NEGATIVE NEGATIVE Final    Comment: (NOTE) SARS-CoV-2 target nucleic acids are NOT DETECTED.  The SARS-CoV-2 RNA is generally detectable in upper respiratory specimens during the acute phase of infection. The lowest concentration of SARS-CoV-2 viral copies this assay can detect is 138 copies/mL. A negative result does not preclude SARS-Cov-2 infection and should not be used as the sole basis for treatment or other patient management decisions. A negative result may occur with  improper specimen collection/handling, submission of specimen other than nasopharyngeal swab, presence of viral mutation(s) within the areas targeted by this assay, and inadequate number of viral copies(<138 copies/mL). A negative result must be combined with clinical observations, patient history, and epidemiological information. The expected result is Negative.  Fact Sheet for Patients:  BloggerCourse.com  Fact Sheet for Healthcare Providers:  SeriousBroker.it  This test is no t yet approved or cleared by the [place]   FDA and  has been authorized for detection and/or diagnosis of SARS-CoV-2 by FDA under an Emergency Use Authorization (EUA). This EUA will remain  in effect (meaning this test can be used) for the duration of the COVID-19 declaration under Section 564(b)(1) of the Act, 21 U.S.C.section 360bbb-3(b)(1), unless the authorization is terminated  or revoked sooner.       Influenza A by PCR NEGATIVE NEGATIVE Final   Influenza B by PCR NEGATIVE NEGATIVE Final    Comment: (NOTE) The Xpert Xpress SARS-CoV-2/FLU/RSV plus assay is intended as an aid in the diagnosis of influenza from Nasopharyngeal swab specimens and should not be used as a sole basis for treatment. Nasal washings and aspirates are unacceptable for Xpert Xpress SARS-CoV-2/FLU/RSV testing.  Fact Sheet for Patients: BloggerCourse.com  Fact Sheet for Healthcare Providers: SeriousBroker.it  This test is not yet approved or cleared by the [place]  FDA and has been authorized for detection and/or diagnosis of SARS-CoV-2 by FDA under an Emergency Use Authorization (EUA). This EUA will remain in effect (meaning this test can be used) for the duration of the COVID-19 declaration under Section 564(b)(1) of the Act, 21 U.S.C. section 360bbb-3(b)(1), unless the authorization is terminated or revoked.     Resp Syncytial Virus by PCR NEGATIVE NEGATIVE Final    Comment: (NOTE) Fact Sheet for Patients: BloggerCourse.com  Fact Sheet for Healthcare Providers: SeriousBroker.it  This test is not yet approved or cleared by the [place]  FDA and has been authorized for detection and/or diagnosis of SARS-CoV-2 by FDA under an Emergency Use Authorization (EUA). This EUA will remain in effect (meaning this test can be used) for the duration of the COVID-19 declaration under Section 564(b)(1) of the Act, 21 U.S.C. section  360bbb-3(b)(1), unless the authorization is terminated or revoked.  Performed at [facility], [address], [place], [place] [address]      Radiology Studies: No results found.   Scheduled Meds:  amLODipine   5 mg Oral BID   apixaban   5 mg Oral BID   budesonide  (PULMICORT ) nebulizer solution  0.5 mg Nebulization Daily   budesonide -glycopyrrolate -formoterol   2 puff Inhalation BID   carvedilol   12.5 mg Oral BID   cyanocobalamin   1,000 mcg Oral Daily   ferrous sulfate   325 mg Oral QPM   furosemide   40 mg Oral BID   insulin  aspart  0-9 Units Subcutaneous TID WC   insulin  glargine-yfgn  10 Units Subcutaneous Daily  ipratropium-albuterol   3 mL Nebulization BID   pantoprazole   40 mg Oral Daily   predniSONE   20 mg Oral Q breakfast   rosuvastatin   10 mg Oral QHS   sodium bicarbonate   1,300 mg Oral TID   sodium chloride  flush  3 mL Intravenous Q12H   Continuous Infusions:     LOS: 3 days  MDM: Patient is high risk for one or more organ failure.  They necessitate ongoing hospitalization for continued IV therapies and subsequent lab monitoring. Total time spent interpreting labs and vitals, reviewing the medical record, coordinating care amongst consultants and care team members, directly assessing and discussing care with the patient and/or family: 55 min  Kenyona Rena, DO [facility]  To contact the attending physician between 7A-7P please use Epic Chat. To contact the covering physician during after hours 7P-7A, please review Amion.  11/09/2023, 3:35 PM   *This document has been created with the assistance of dictation software. Please excuse typographical errors. *

## 2023-11-09 NOTE — Inpatient Diabetes Management (Signed)
 Inpatient Diabetes Program Recommendations  AACE/ADA: New Consensus Statement on Inpatient Glycemic Control (2015)  Target Ranges:  Prepandial:   less than 140 mg/dL      Peak postprandial:   less than 180 mg/dL (1-2 hours)      Critically ill patients:  140 - 180 mg/dL   Lab Results  Component Value Date   GLUCAP 216 (H) 11/09/2023   HGBA1C 6.7 (A) 09/18/2023    Latest Reference Range & Units 11/08/23 08:09 11/08/23 12:17 11/08/23 16:21 11/08/23 23:51 11/09/23 07:27  Glucose-Capillary 70 - 99 mg/dL 130 (H) 865 (H) 784 (H) 254 (H) 216 (H)  (H): Data is abnormally high  Diabetes history: DM2 Outpatient Diabetes medications: Toujeo  10 units at HS + Humalog  0-10 TID  Current orders for Inpatient glycemic control: Semglee  10 units daily Novolog  0-9 units tid correction Prednisone  20 mg daily  Inpatient Diabetes Program Recommendations:    Please consider: -Add Novolog  0-5 units correction @ hs  Thank you, Marily Shows E. Demetric Parslow, RN, MSN, CDCES  Diabetes Coordinator Inpatient Glycemic Control Team Team Pager [phone] (8am-5pm) 11/09/2023 11:04 AM

## 2023-11-10 DIAGNOSIS — N179 Acute kidney failure, unspecified: Secondary | ICD-10-CM | POA: Diagnosis not present

## 2023-11-10 DIAGNOSIS — I5023 Acute on chronic systolic (congestive) heart failure: Secondary | ICD-10-CM | POA: Diagnosis not present

## 2023-11-10 DIAGNOSIS — I509 Heart failure, unspecified: Secondary | ICD-10-CM | POA: Diagnosis not present

## 2023-11-10 DIAGNOSIS — J438 Other emphysema: Secondary | ICD-10-CM | POA: Diagnosis not present

## 2023-11-10 LAB — GLUCOSE, CAPILLARY
Glucose-Capillary: 182 mg/dL — ABNORMAL HIGH (ref 70–99)
Glucose-Capillary: 261 mg/dL — ABNORMAL HIGH (ref 70–99)
Glucose-Capillary: 321 mg/dL — ABNORMAL HIGH (ref 70–99)
Glucose-Capillary: 305 mg/dL — ABNORMAL HIGH (ref 70–99)

## 2023-11-10 LAB — BASIC METABOLIC PANEL WITH GFR
Anion gap: 9 (ref 5–15)
BUN: 113 mg/dL — ABNORMAL HIGH (ref 8–23)
CO2: 22 mmol/L (ref 22–32)
Calcium: 8.1 mg/dL — ABNORMAL LOW (ref 8.9–10.3)
Chloride: 104 mmol/L (ref 98–111)
Creatinine, Ser: 4.53 mg/dL — ABNORMAL HIGH (ref 0.61–1.24)
GFR, Estimated: 13 mL/min — ABNORMAL LOW (ref >60)
Glucose, Bld: 274 mg/dL — ABNORMAL HIGH (ref 70–99)
Potassium: 4.6 mmol/L (ref 3.5–5.1)
Sodium: 135 mmol/L (ref 135–145)

## 2023-11-10 LAB — MAGNESIUM: Magnesium: 2.5 mg/dL — ABNORMAL HIGH (ref 1.7–2.4)

## 2023-11-10 MED ORDER — INSULIN GLARGINE-YFGN 100 UNIT/ML ~~LOC~~ SOLN
15.0000 [IU] | Freq: Every day | SUBCUTANEOUS | Status: DC
  Administered 2023-11-11 – 2023-11-13 (×3): 15 [IU] via SUBCUTANEOUS
  Filled 2023-11-10 (×3): qty 0.15

## 2023-11-10 NOTE — Plan of Care (Signed)
  Problem: Health Behavior/Discharge Planning: Goal: Ability to manage health-related needs will improve Outcome: Progressing   Problem: Clinical Measurements: Goal: Cardiovascular complication will be avoided Outcome: Progressing   Problem: Coping: Goal: Level of anxiety will decrease Outcome: Progressing   Problem: Skin Integrity: Goal: Risk for impaired skin integrity will decrease Outcome: Progressing

## 2023-11-10 NOTE — Plan of Care (Signed)

## 2023-11-10 NOTE — Progress Notes (Signed)
 Occupational Therapy Treatment Patient Details Name: Larry Callahan MRN: [id number] DOB: [birth year] Today's Date: 11/10/2023   History of present illness Pt is a 68 y/o male admitted secondary to SOB and found to have acute on chronic CHF, AKI and COPD exacerbation. PMH including but not limited to HFrEF with last EF of 40% secondary to nonischemic cardiomyopathy, atrial flutter s/p ablation on Eliquis , CKD stage IV, hypertension, type 2 diabetes, COPD, OSA, hyperlipidemia, nonobstructive CAD, CVA.   OT comments  Pt seen for OT tx. Pt endorsing shoulder pain and declines EOB/OOB ADL participation. Pt educated in benefits of EOB/OOB ADL participation to support recovery and minimize functional decline. Pt verbalized understanding. Agreeable to bed level grooming to support improved hygiene. Pt required MAX A for shaving with electric razor and set up for all other grooming tasks. Pt continues to benefit from skilled OT services.       If plan is discharge home, recommend the following:  Supervision due to cognitive status;Assistance with cooking/housework;A little help with bathing/dressing/bathroom;A little help with walking and/or transfers;Direct supervision/assist for financial management;Direct supervision/assist for medications management   Equipment Recommendations  None recommended by OT    Recommendations for Other Services      Precautions / Restrictions Precautions Precautions: Fall Recall of Precautions/Restrictions: Impaired Restrictions Weight Bearing Restrictions Per Provider Order: No       Mobility Bed Mobility               General bed mobility comments: pt declined    Transfers                   General transfer comment: pt declined     Balance                                           ADL either performed or assessed with clinical judgement   ADL Overall ADL's : Needs assistance/impaired     Grooming: Bed  level;Sitting;Set up;Maximal assistance;Wash/dry face Grooming Details (indicate cue type and reason): long sitting in bed; MAX A to shave, set up for all other grooming tasks                                    Extremity/Trunk Assessment              Vision       Perception     Praxis     Communication     Cognition Arousal: Alert Behavior During Therapy: WFL for tasks assessed/performed Cognition: No apparent impairments                               Following commands: Intact        Cueing      Exercises Other Exercises Other Exercises: Pt educated in benefits of EOB/OOB ADL participation to support recovery and minimize functional decline    Shoulder Instructions       General Comments      Pertinent Vitals/ Pain       Pain Assessment Pain Assessment: 0-10 Pain Score: 5  Pain Location: L shoulder Pain Descriptors / Indicators: Aching Pain Intervention(s): Limited activity within patient's tolerance, Monitored during session, Repositioned  Home Living  Prior Functioning/Environment              Frequency  Min 2X/week        Progress Toward Goals  OT Goals(current goals can now be found in the care plan section)  Progress towards OT goals: Progressing toward goals  Acute Rehab OT Goals Patient Stated Goal: return home OT Goal Formulation: With patient Time For Goal Achievement: 11/19/23 Potential to Achieve Goals: Good  Plan      Co-evaluation                 AM-PAC OT "6 Clicks" Daily Activity     Outcome Measure   Help from another person eating meals?: None Help from another person taking care of personal grooming?: A Little Help from another person toileting, which includes using toliet, bedpan, or urinal?: A Little Help from another person bathing (including washing, rinsing, drying)?: A Little Help from another person to put on and  taking off regular upper body clothing?: None Help from another person to put on and taking off regular lower body clothing?: None 6 Click Score: 21    End of Session    OT Visit Diagnosis: Other abnormalities of gait and mobility (R26.89);Unsteadiness on feet (R26.81)   Activity Tolerance Patient limited by pain   Patient Left in bed;with call bell/phone within reach;with bed alarm set   Nurse Communication          Time: 1610-9604 OT Time Calculation (min): 13 min  Charges: OT General Charges $OT Visit: 1 Visit OT Treatments $Self Care/Home Management : 8-22 mins  Berenda Breaker., MPH, MS, OTR/L ascom [phone] 11/10/23, 12:41 PM

## 2023-11-10 NOTE — Progress Notes (Signed)
 PROGRESS NOTE    Larry Callahan  ZOX:[id number] DOB: 11/27/1955 DOA: 11/04/2023 PCP: Laurence Pons, NP  Chief Complaint  Patient presents with   Shortness of Breath    Hospital Course:  Larry Callahan is a 68 year old male with heart failure with reduced EF 40% secondary to nonischemic cardiomyopathy, a flutter status post ablation on Eliquis , CKD stage IV, hypertension, type 2 diabetes, COPD, OSA, HLD, nonobstructive CAD, CVA, who presents to the ED with shortness of breath.  He was admitted for CHF exacerbation.  Patient also had recent admissions for COPD exacerbations with pneumonia.  Patient was started on IV and had excellent urine output.  He continued to have dyspnea and was started on treatment for COPD exacerbation.  Subjective: No acute events overnight. On evaluation today patient complains of left-sided back pain.  Reports muscle relaxers make him sleepy.    Objective: Vitals:   11/10/23 0500 11/10/23 0510 11/10/23 0718 11/10/23 0830  BP:  134/74  (!) 140/64  Pulse:  66  60  Resp:  18  20  Temp:  (!) 97.3 F (36.3 C)  98.2 F (36.8 C)  TempSrc:      SpO2:  98% 98% 100%  Weight: 87.7 kg     Height:        Intake/Output Summary (Last 24 hours) at 11/10/2023 1148 Last data filed at 11/10/2023 1038 Gross per 24 hour  Intake 720 ml  Output 1250 ml  Net -530 ml   Filed Weights   11/07/23 0500 11/09/23 0403 11/10/23 0500  Weight: 85.4 kg 86.6 kg 87.7 kg    Examination: General exam: Appears calm and comfortable, NAD  Respiratory system: No work of breathing, symmetric chest wall expansion.  No wheezing today.  On room air cardiovascular system: S1 & S2 heard, RRR.  Gastrointestinal system: Abdomen is nondistended, soft and nontender.  Neuro: Alert and oriented. No focal neurological deficits. Extremities: Symmetric, expected ROM Skin: No rashes, lesions Psychiatry: Demonstrates appropriate judgement and insight. Mood & affect appropriate for  situation.   Assessment & Plan:  Principal Problem:   Acute on chronic HFrEF (heart failure with reduced ejection fraction) (HCC) Active Problems:   Acute kidney injury superimposed on chronic kidney disease (HCC)   COPD (chronic obstructive pulmonary disease) (HCC)   Essential hypertension   Type 2 diabetes mellitus (HCC)   Atrial flutter (HCC)   Metabolic acidosis, normal anion gap (NAG)   CHF exacerbation (HCC)   Acute on chronic heart failure with reduced EF - November 2024: EF 40%, suspected secondary to a flutter - BNP elevated above baseline - Pulmonary edema on exam on arrival, no lower extremity edema - Was initially on IV Lasix  but transition to p.o. on 5/27.  Was becoming dry on 5/29 and Lasix  was discontinued entirely - Continue to follow strict I's and O's - Stable at this time without Lasix  - May require small amount of volume replacement tomorrow  Back pain - Chronic.  Ongoing.  Likely MSK etiology - Continue with muscle relaxers as needed, heating pad, out of bed frequently - PT/OT - As needed Tylenol  and Norco - Unlikely cardiac etiology given normal EKG and troponin  AKI superimposed on CKD stage IV - Baseline creatinine near 3.5, acute worsening on arrival to 5.2 - Patient is a poor historian, reports he has right kidney only due to " masses" on the other.  He does not follow with a nephrologist outpatient - Creatinine downtrending some but has not resolved to baseline.  Likely slow resolution given diuresis - Off all diuretics now - Have consulted nephrology who recommends continuing to monitor in house until kidney improvement - Patient is nearing the need for dialysis - Hold nephrotoxic medications - May require very slow IV fluid tomorrow if kidney function not improving  Headache, resolved - As needed Tylenol .  Status post Fioricet  x 1  COPD, in exacerbation -On room air, continue close monitoring of respiratory status - Continue with prednisone   to complete 5-day course - Continue with scheduled DuoNebs - No wheezing on exam  Leukopenia - WBC 1.3 on arrival - WBC recovered - Afebrile - Ordered for repeat CBC in a.m.  Metabolic acidosis, normal anion gap - Resume home dose bicarb  Anemia of chronic disease - Hemoglobin stable above 7 - Active signs of bleeding - Repeat CBC in a.m.  A flutter - Status post ablation [facility] in December 2024 - EKG: Junctional versus sinus rhythm, consistent with prior - Continue home meds  Type 2 diabetes - Hold home regimen for now - Continue basal/bolus.  Follow CBGs, titrate daily  Hypertension - Blood pressure better controlled - Resume home meds gradually as BP tolerates  DVT prophylaxis: Eliquis    Code Status: Full Code Disposition:  Still inpatient pending resolution of kidney function.  High risk of needing dialysis. Discussed care plan with Nephew, Oral, on the phone today.  Consultants:  Treatment Team:  Consulting Physician: Lateef, Munsoor, MD  Procedures:    Antimicrobials:  Anti-infectives (From admission, onward)    Start     Dose/Rate Route Frequency Ordered Stop   11/07/23 1730  azithromycin  (ZITHROMAX ) tablet 500 mg  Status:  Discontinued        500 mg Oral Daily 11/07/23 1634 11/08/23 1318       Data Reviewed: I have personally reviewed following labs and imaging studies CBC: Recent Labs  Lab 11/04/23 1545 11/05/23 0404 11/06/23 0524 11/07/23 0858  WBC 2.7* 1.3* 5.7 5.1  NEUTROABS 1.9 0.9*  --   --   HGB 8.1* 7.2* 7.0* 7.6*  HCT 26.2* 21.6* 22.1* 22.8*  MCV 93.9 90.0 89.1 88.0  PLT 165 172 189 216   Basic Metabolic Panel: Recent Labs  Lab 11/05/23 0404 11/06/23 0524 11/07/23 0516 11/08/23 0233 11/09/23 0414 11/10/23 0352  NA 132* 131* 136 135 136 135  K 4.9 4.3 3.9 4.1 4.6 4.6  CL 104 102 105 101 103 104  CO2 17* 16* 22 23 22 22   GLUCOSE 397* 228* 157* 179* 291* 274*  BUN 91* 95* 93* 99* 108* 113*  CREATININE 5.13* 4.85* 4.64* 4.58*  4.86* 4.53*  CALCIUM  7.6* 7.5* 7.4* 7.4* 7.9* 8.1*  MG 2.1  --  2.0 1.8 2.4 2.5*  PHOS 5.6* 5.5* 4.5  --   --   --    GFR: Estimated Creatinine Clearance: 16.2 mL/min (A) (by C-G formula based on SCr of 4.53 mg/dL (H)). Liver Function Tests: Recent Labs  Lab 11/04/23 1545 11/06/23 0524  AST 24  --   ALT 12  --   ALKPHOS 68  --   BILITOT 0.7  --   PROT 6.6  --   ALBUMIN  2.5* 2.4*   CBG: Recent Labs  Lab 11/08/23 2351 11/09/23 0727 11/09/23 1137 11/09/23 1601 11/10/23 0827  GLUCAP 254* 216* 263* 323* 182*    Recent Results (from the past 240 hours)  Resp panel by RT-PCR (RSV, Flu A&B, Covid) Anterior Nasal Swab     Status: None   Collection Time:  11/04/23  3:45 PM   Specimen: Anterior Nasal Swab  Result Value Ref Range Status   SARS Coronavirus 2 by RT PCR NEGATIVE NEGATIVE Final    Comment: (NOTE) SARS-CoV-2 target nucleic acids are NOT DETECTED.  The SARS-CoV-2 RNA is generally detectable in upper respiratory specimens during the acute phase of infection. The lowest concentration of SARS-CoV-2 viral copies this assay can detect is 138 copies/mL. A negative result does not preclude SARS-Cov-2 infection and should not be used as the sole basis for treatment or other patient management decisions. A negative result may occur with  improper specimen collection/handling, submission of specimen other than nasopharyngeal swab, presence of viral mutation(s) within the areas targeted by this assay, and inadequate number of viral copies(<138 copies/mL). A negative result must be combined with clinical observations, patient history, and epidemiological information. The expected result is Negative.  Fact Sheet for Patients:  BloggerCourse.com  Fact Sheet for Healthcare Providers:  SeriousBroker.it  This test is no t yet approved or cleared by the [place]  FDA and  has been authorized for detection and/or diagnosis of  SARS-CoV-2 by FDA under an Emergency Use Authorization (EUA). This EUA will remain  in effect (meaning this test can be used) for the duration of the COVID-19 declaration under Section 564(b)(1) of the Act, 21 U.S.C.section 360bbb-3(b)(1), unless the authorization is terminated  or revoked sooner.       Influenza A by PCR NEGATIVE NEGATIVE Final   Influenza B by PCR NEGATIVE NEGATIVE Final    Comment: (NOTE) The Xpert Xpress SARS-CoV-2/FLU/RSV plus assay is intended as an aid in the diagnosis of influenza from Nasopharyngeal swab specimens and should not be used as a sole basis for treatment. Nasal washings and aspirates are unacceptable for Xpert Xpress SARS-CoV-2/FLU/RSV testing.  Fact Sheet for Patients: BloggerCourse.com  Fact Sheet for Healthcare Providers: SeriousBroker.it  This test is not yet approved or cleared by the [place]  FDA and has been authorized for detection and/or diagnosis of SARS-CoV-2 by FDA under an Emergency Use Authorization (EUA). This EUA will remain in effect (meaning this test can be used) for the duration of the COVID-19 declaration under Section 564(b)(1) of the Act, 21 U.S.C. section 360bbb-3(b)(1), unless the authorization is terminated or revoked.     Resp Syncytial Virus by PCR NEGATIVE NEGATIVE Final    Comment: (NOTE) Fact Sheet for Patients: BloggerCourse.com  Fact Sheet for Healthcare Providers: SeriousBroker.it  This test is not yet approved or cleared by the [place]  FDA and has been authorized for detection and/or diagnosis of SARS-CoV-2 by FDA under an Emergency Use Authorization (EUA). This EUA will remain in effect (meaning this test can be used) for the duration of the COVID-19 declaration under Section 564(b)(1) of the Act, 21 U.S.C. section 360bbb-3(b)(1), unless the authorization is terminated  or revoked.  Performed at [facility], [address] [place], [place] [address]      Radiology Studies: No results found.   Scheduled Meds:  amLODipine   5 mg Oral BID   apixaban   5 mg Oral BID   budesonide  (PULMICORT ) nebulizer solution  0.5 mg Nebulization Daily   budesonide -glycopyrrolate -formoterol   2 puff Inhalation BID   carvedilol   12.5 mg Oral BID   cyanocobalamin   1,000 mcg Oral Daily   ferrous sulfate   325 mg Oral QPM   insulin  aspart  0-9 Units Subcutaneous TID WC   insulin  glargine-yfgn  10 Units Subcutaneous Daily   ipratropium-albuterol   3 mL Nebulization BID   pantoprazole   40 mg Oral Daily   predniSONE   20 mg Oral Q breakfast   rosuvastatin   10 mg Oral QHS   sodium bicarbonate   1,300 mg Oral TID   sodium chloride  flush  3 mL Intravenous Q12H   Continuous Infusions:     LOS: 4 days  MDM: Patient is high risk for one or more organ failure.  They necessitate ongoing hospitalization for continued IV therapies and subsequent lab monitoring. Total time spent interpreting labs and vitals, reviewing the medical record, coordinating care amongst consultants and care team members, directly assessing and discussing care with the patient and/or family: 55 min  Alexandera Kuntzman, DO [facility]  To contact the attending physician between 7A-7P please use Epic Chat. To contact the covering physician during after hours 7P-7A, please review Amion.  11/10/2023, 11:48 AM   *This document has been created with the assistance of dictation software. Please excuse typographical errors. *

## 2023-11-10 NOTE — Inpatient Diabetes Management (Signed)
 Inpatient Diabetes Program Recommendations  AACE/ADA: New Consensus Statement on Inpatient Glycemic Control   Target Ranges:  Prepandial:   less than 140 mg/dL      Peak postprandial:   less than 180 mg/dL (1-2 hours)      Critically ill patients:  140 - 180 mg/dL    Latest Reference Range & Units 11/09/23 07:27 11/09/23 11:37 11/09/23 16:01 11/10/23 08:27 11/10/23 11:46  Glucose-Capillary 70 - 99 mg/dL 409 (H) 811 (H) 914 (H) 182 (H) 261 (H)    Latest Reference Range & Units 11/08/23 08:09 11/08/23 12:17 11/08/23 16:21 11/08/23 23:51  Glucose-Capillary 70 - 99 mg/dL 782 (H) 956 (H) 213 (H) 254 (H)   Review of Glycemic Control  Diabetes history: DM2 Outpatient Diabetes medications: Toujeo  10 units at bedtime, Humalog  0-10 units TID with meals Current orders for Inpatient glycemic control: Semglee  15 units daily, Novolog  0-9 units TID with meals; Prednisone  20 mg QAM  Inpatient Diabetes Program Recommendations:    Insulin : Please consider ordering Novolog  0-5 units at bedtime.  If steroids are continued, please consider ordering Novolog  4 units TID with meals for meal coverage if patient eats at least 50% of meals.   Thanks, Beacher Limerick, RN, MSN, CDCES Diabetes Coordinator Inpatient Diabetes Program [phone] (Team Pager from 8am to 5pm)

## 2023-11-10 NOTE — Progress Notes (Addendum)
 [facility]  ROUNDING NOTE   Subjective:   Patient seen laying in bed Alert and oriented Room air No lower extremity edema   Objective:  Vital signs in last 24 hours:  Temp:  [97.3 F (36.3 C)-98.2 F (36.8 C)] 98.2 F (36.8 C) (05/30 0830) Pulse Rate:  [57-71] 60 (05/30 0830) Resp:  [17-20] 20 (05/30 0830) BP: (127-141)/(64-74) 140/64 (05/30 0830) SpO2:  [96 %-100 %] 100 % (05/30 0830) Weight:  [87.7 kg] 87.7 kg (05/30 0500)  Weight change: 1.1 kg Filed Weights   11/07/23 0500 11/09/23 0403 11/10/23 0500  Weight: 85.4 kg 86.6 kg 87.7 kg    Intake/Output: I/O last 3 completed shifts: In: 480 [P.O.:480] Out: 2500 [Urine:2500]   Intake/Output this shift:  Total I/O In: 480 [P.O.:480] Out: 200 [Urine:200]  Physical Exam: General: NAD  Head: Normocephalic, atraumatic. Moist oral mucosal membranes  Eyes: Anicteric  Lungs:  Clear to auscultation  Heart: Regular rate and rhythm  Abdomen:  Soft, nontender  Extremities:  No peripheral edema.  Neurologic: Awake, alert, conversant  Skin: Warm,dry, no rash  Access: None    Basic Metabolic Panel: Recent Labs  Lab 11/05/23 0404 11/06/23 0524 11/07/23 0516 11/08/23 0233 11/09/23 0414 11/10/23 0352  NA 132* 131* 136 135 136 135  K 4.9 4.3 3.9 4.1 4.6 4.6  CL 104 102 105 101 103 104  CO2 17* 16* 22 23 22 22   GLUCOSE 397* 228* 157* 179* 291* 274*  BUN 91* 95* 93* 99* 108* 113*  CREATININE 5.13* 4.85* 4.64* 4.58* 4.86* 4.53*  CALCIUM  7.6* 7.5* 7.4* 7.4* 7.9* 8.1*  MG 2.1  --  2.0 1.8 2.4 2.5*  PHOS 5.6* 5.5* 4.5  --   --   --     Liver Function Tests: Recent Labs  Lab 11/04/23 1545 11/06/23 0524  AST 24  --   ALT 12  --   ALKPHOS 68  --   BILITOT 0.7  --   PROT 6.6  --   ALBUMIN  2.5* 2.4*   No results for input(s): "LIPASE", "AMYLASE" in the last 168 hours. No results for input(s): "AMMONIA" in the last 168 hours.  CBC: Recent Labs  Lab 11/04/23 1545 11/05/23 0404 11/06/23 0524  11/07/23 0858  WBC 2.7* 1.3* 5.7 5.1  NEUTROABS 1.9 0.9*  --   --   HGB 8.1* 7.2* 7.0* 7.6*  HCT 26.2* 21.6* 22.1* 22.8*  MCV 93.9 90.0 89.1 88.0  PLT 165 172 189 216    Cardiac Enzymes: No results for input(s): "CKTOTAL", "CKMB", "CKMBINDEX", "TROPONINI" in the last 168 hours.  BNP: Invalid input(s): "POCBNP"  CBG: Recent Labs  Lab 11/09/23 0727 11/09/23 1137 11/09/23 1601 11/10/23 0827 11/10/23 1146  GLUCAP 216* 263* 323* 182* 261*    Microbiology: Results for orders placed or performed during the hospital encounter of 11/04/23  Resp panel by RT-PCR (RSV, Flu A&B, Covid) Anterior Nasal Swab     Status: None   Collection Time: 11/04/23  3:45 PM   Specimen: Anterior Nasal Swab  Result Value Ref Range Status   SARS Coronavirus 2 by RT PCR NEGATIVE NEGATIVE Final    Comment: (NOTE) SARS-CoV-2 target nucleic acids are NOT DETECTED.  The SARS-CoV-2 RNA is generally detectable in upper respiratory specimens during the acute phase of infection. The lowest concentration of SARS-CoV-2 viral copies this assay can detect is 138 copies/mL. A negative result does not preclude SARS-Cov-2 infection and should not be used as the sole basis for treatment or  other patient management decisions. A negative result may occur with  improper specimen collection/handling, submission of specimen other than nasopharyngeal swab, presence of viral mutation(s) within the areas targeted by this assay, and inadequate number of viral copies(<138 copies/mL). A negative result must be combined with clinical observations, patient history, and epidemiological information. The expected result is Negative.  Fact Sheet for Patients:  BloggerCourse.com  Fact Sheet for Healthcare Providers:  SeriousBroker.it  This test is no t yet approved or cleared by the [place]  FDA and  has been authorized for detection and/or diagnosis of SARS-CoV-2 by FDA  under an Emergency Use Authorization (EUA). This EUA will remain  in effect (meaning this test can be used) for the duration of the COVID-19 declaration under Section 564(b)(1) of the Act, 21 U.S.C.section 360bbb-3(b)(1), unless the authorization is terminated  or revoked sooner.       Influenza A by PCR NEGATIVE NEGATIVE Final   Influenza B by PCR NEGATIVE NEGATIVE Final    Comment: (NOTE) The Xpert Xpress SARS-CoV-2/FLU/RSV plus assay is intended as an aid in the diagnosis of influenza from Nasopharyngeal swab specimens and should not be used as a sole basis for treatment. Nasal washings and aspirates are unacceptable for Xpert Xpress SARS-CoV-2/FLU/RSV testing.  Fact Sheet for Patients: BloggerCourse.com  Fact Sheet for Healthcare Providers: SeriousBroker.it  This test is not yet approved or cleared by the [place]  FDA and has been authorized for detection and/or diagnosis of SARS-CoV-2 by FDA under an Emergency Use Authorization (EUA). This EUA will remain in effect (meaning this test can be used) for the duration of the COVID-19 declaration under Section 564(b)(1) of the Act, 21 U.S.C. section 360bbb-3(b)(1), unless the authorization is terminated or revoked.     Resp Syncytial Virus by PCR NEGATIVE NEGATIVE Final    Comment: (NOTE) Fact Sheet for Patients: BloggerCourse.com  Fact Sheet for Healthcare Providers: SeriousBroker.it  This test is not yet approved or cleared by the [place]  FDA and has been authorized for detection and/or diagnosis of SARS-CoV-2 by FDA under an Emergency Use Authorization (EUA). This EUA will remain in effect (meaning this test can be used) for the duration of the COVID-19 declaration under Section 564(b)(1) of the Act, 21 U.S.C. section 360bbb-3(b)(1), unless the authorization is terminated or revoked.  Performed at [facility], [address] [place], [place] [address]     Coagulation Studies: No results for input(s): "LABPROT", "INR" in the last 72 hours.  Urinalysis: No results for input(s): "COLORURINE", "LABSPEC", "PHURINE", "GLUCOSEU", "HGBUR", "BILIRUBINUR", "KETONESUR", "PROTEINUR", "UROBILINOGEN", "NITRITE", "LEUKOCYTESUR" in the last 72 hours.  Invalid input(s): "APPERANCEUR"    Imaging: No results found.   Medications:     amLODipine   5 mg Oral BID   apixaban   5 mg Oral BID   budesonide  (PULMICORT ) nebulizer solution  0.5 mg Nebulization Daily   budesonide -glycopyrrolate -formoterol   2 puff Inhalation BID   carvedilol   12.5 mg Oral BID   cyanocobalamin   1,000 mcg Oral Daily   ferrous sulfate   325 mg Oral QPM   insulin  aspart  0-9 Units Subcutaneous TID WC   [START ON 11/11/2023] insulin  glargine-yfgn  15 Units Subcutaneous Daily   ipratropium-albuterol   3 mL Nebulization BID   pantoprazole   40 mg Oral Daily   predniSONE   20 mg Oral Q breakfast   rosuvastatin   10 mg Oral QHS   sodium bicarbonate   1,300 mg Oral TID   sodium chloride  flush  3 mL Intravenous Q12H   acetaminophen , HYDROcodone -acetaminophen ,  melatonin, ondansetron  (ZOFRAN ) IV, sodium chloride  flush  Assessment/ Plan:  Mr. Anatole Apollo Chenault is a 68 y.o.  male with past medical conditions including CHF, COPD, CAD, stroke, diabetes, CKDIV and hypertension, who was admitted to [facility] on 11/04/2023 for COPD exacerbation (HCC) [J44.1] AKI (acute kidney injury) (HCC) [N17.9] Acute on chronic HFrEF (heart failure with reduced ejection fraction) (HCC) [I50.23] Acute on chronic congestive heart failure, unspecified heart failure type (HCC) [I50.9] CHF exacerbation (HCC) [I50.9]   Acute kidney injury on chronic kidney disease IV. AKI likely secondary to hemodynamic instability and agressive diuresis. Baseline creatinine appears to be 3.25 with GFR 20 on 10/17/23.  History of solitary kidney.  Patient initially treated  with IV Furosemide  but transitioned to oral. Creatinine has shown improvement today.  Suspicious for poor oral intake persist however hesitant to give IV fluids with documented CHF.  Will monitor today and may consider gentle IV hydration tomorrow if needed.  No acute indication for dialysis, patient states he would not like dialysis as well.  Continue holding diuretics.  Lab Results  Component Value Date   CREATININE 4.53 (H) 11/10/2023   CREATININE 4.86 (H) 11/09/2023   CREATININE 4.58 (H) 11/08/2023    Intake/Output Summary (Last 24 hours) at 11/10/2023 1227 Last data filed at 11/10/2023 1038 Gross per 24 hour  Intake 720 ml  Output 1250 ml  Net -530 ml   2. Chronic systolic heart failure, ECHO from 11/24 has EF 40%. Treated with IV furosemide , then oral. Now held due to kidney injury.   3. Anemia of chronic kidney disease Lab Results  Component Value Date   HGB 7.6 (L) 11/07/2023  Hemoglobin remains decreased.  May consider ESA if remains decreased.   4. Diabetes mellitus type II with chronic kidney disease/renal manifestations: insulin  dependent. Home regimen includes Lantus  and humalog . Most recent hemoglobin A1c is 6.7 on 09/18/23.   Glucose elevated at times.  SSI managed by primary team.   LOS: 4 Larry Callahan 5/30/202512:27 PM

## 2023-11-10 NOTE — Consult Note (Signed)
 PHARMACY CONSULT NOTE - ELECTROLYTES  Pharmacy Consult for Electrolyte Monitoring and Replacement   Recent Labs: Height: 5\' 11"  (180.3 cm) Weight: 87.7 kg (193 lb 5.5 oz) IBW/kg (Calculated) : 75.3 Estimated Creatinine Clearance: 16.2 mL/min (A) (by C-G formula based on SCr of 4.53 mg/dL (H)).  Potassium (mmol/L)  Date Value  11/10/2023 4.6  03/06/2013 3.9   Magnesium  (mg/dL)  Date Value  16/03/9603 2.5 (H)  12/11/2012 1.8   Calcium  (mg/dL)  Date Value  54/02/8118 8.1 (L)   Calcium , Total (mg/dL)  Date Value  14/78/2956 9.3   Albumin  (g/dL)  Date Value  21/30/8657 2.4 (L)  07/15/2021 3.4 (L)  03/06/2013 3.4   Phosphorus (mg/dL)  Date Value  84/69/6295 4.5   Sodium (mmol/L)  Date Value  11/10/2023 135  07/15/2021 136  03/06/2013 138   Corrected calcium : 9.18 mg/dL   Assessment  Larry Callahan is a 68 y.o. male presenting with shortness or breath (HF exacerbation). PMH significant for  HFrEF with last EF of 40% secondary to nonischemic cardiomyopathy, atrial flutter s/p ablation on Eliquis , CKD stage IV, hypertension, type 2 diabetes, COPD, OSA, hyperlipidemia, nonobstructive CAD, and CVA. Pharmacy has been consulted to monitor and replace electrolytes.  Diet: heart healthy/carb modified Pertinent medications: sodium bicarb 1300mg  TID, holding furosemide  40 mg PO BID   Goal of Therapy: Electrolytes WNL  Plan:  No replacement needed at this time. Electrolytes are stable. Pharmacy will sign off. Please re-consult if needed.   Larry Callahan, PharmD 11/10/2023 7:00 AM

## 2023-11-11 DIAGNOSIS — J438 Other emphysema: Secondary | ICD-10-CM | POA: Diagnosis not present

## 2023-11-11 DIAGNOSIS — I5023 Acute on chronic systolic (congestive) heart failure: Secondary | ICD-10-CM | POA: Diagnosis not present

## 2023-11-11 DIAGNOSIS — N179 Acute kidney failure, unspecified: Secondary | ICD-10-CM | POA: Diagnosis not present

## 2023-11-11 DIAGNOSIS — I509 Heart failure, unspecified: Secondary | ICD-10-CM | POA: Diagnosis not present

## 2023-11-11 LAB — COMPREHENSIVE METABOLIC PANEL WITH GFR
ALT: 18 U/L (ref 0–44)
AST: 20 U/L (ref 15–41)
Albumin: 2.3 g/dL — ABNORMAL LOW (ref 3.5–5.0)
Alkaline Phosphatase: 50 U/L (ref 38–126)
Anion gap: 9 (ref 5–15)
BUN: 117 mg/dL — ABNORMAL HIGH (ref 8–23)
CO2: 23 mmol/L (ref 22–32)
Calcium: 8.3 mg/dL — ABNORMAL LOW (ref 8.9–10.3)
Chloride: 105 mmol/L (ref 98–111)
Creatinine, Ser: 4.26 mg/dL — ABNORMAL HIGH (ref 0.61–1.24)
GFR, Estimated: 14 mL/min — ABNORMAL LOW (ref >60)
Glucose, Bld: 290 mg/dL — ABNORMAL HIGH (ref 70–99)
Potassium: 4.8 mmol/L (ref 3.5–5.1)
Sodium: 137 mmol/L (ref 135–145)
Total Bilirubin: 0.8 mg/dL (ref 0.0–1.2)
Total Protein: 5.8 g/dL — ABNORMAL LOW (ref 6.5–8.1)

## 2023-11-11 LAB — CBC WITH DIFFERENTIAL/PLATELET
Abs Immature Granulocytes: 0.1 10*3/uL — ABNORMAL HIGH (ref 0.00–0.07)
Basophils Absolute: 0 10*3/uL (ref 0.0–0.1)
Basophils Relative: 0 %
Eosinophils Absolute: 0 10*3/uL (ref 0.0–0.5)
Eosinophils Relative: 0 %
HCT: 21.4 % — ABNORMAL LOW (ref 39.0–52.0)
Hemoglobin: 6.8 g/dL — ABNORMAL LOW (ref 13.0–17.0)
Immature Granulocytes: 1 %
Lymphocytes Relative: 21 %
Lymphs Abs: 1.5 10*3/uL (ref 0.7–4.0)
MCH: 28.9 pg (ref 26.0–34.0)
MCHC: 31.8 g/dL (ref 30.0–36.0)
MCV: 91.1 fL (ref 80.0–100.0)
Monocytes Absolute: 0.5 10*3/uL (ref 0.1–1.0)
Monocytes Relative: 7 %
Neutro Abs: 5.1 10*3/uL (ref 1.7–7.7)
Neutrophils Relative %: 71 %
Platelets: 236 10*3/uL (ref 150–400)
RBC: 2.35 MIL/uL — ABNORMAL LOW (ref 4.22–5.81)
RDW: 13.5 % (ref 11.5–15.5)
WBC: 7.2 10*3/uL (ref 4.0–10.5)
nRBC: 0 % (ref 0.0–0.2)

## 2023-11-11 LAB — GLUCOSE, CAPILLARY
Glucose-Capillary: 219 mg/dL — ABNORMAL HIGH (ref 70–99)
Glucose-Capillary: 197 mg/dL — ABNORMAL HIGH (ref 70–99)
Glucose-Capillary: 350 mg/dL — ABNORMAL HIGH (ref 70–99)
Glucose-Capillary: 402 mg/dL — ABNORMAL HIGH (ref 70–99)
Glucose-Capillary: 377 mg/dL — ABNORMAL HIGH (ref 70–99)

## 2023-11-11 LAB — PHOSPHORUS: Phosphorus: 3.5 mg/dL (ref 2.5–4.6)

## 2023-11-11 LAB — PREPARE RBC (CROSSMATCH)

## 2023-11-11 LAB — MAGNESIUM: Magnesium: 2.4 mg/dL (ref 1.7–2.4)

## 2023-11-11 MED ORDER — SODIUM CHLORIDE 0.9% IV SOLUTION: Freq: Once | INTRAVENOUS | Status: AC

## 2023-11-11 MED ORDER — SODIUM BICARBONATE 650 MG PO TABS
1300.0000 mg | ORAL_TABLET | Freq: Two times a day (BID) | ORAL | Status: DC
  Administered 2023-11-11 – 2023-11-13 (×4): 1300 mg via ORAL
  Filled 2023-11-11 (×4): qty 2

## 2023-11-11 MED ORDER — IPRATROPIUM-ALBUTEROL 0.5-2.5 (3) MG/3ML IN SOLN
3.0000 mL | RESPIRATORY_TRACT | Status: DC | PRN
  Administered 2023-11-12: 3 mL via RESPIRATORY_TRACT
  Filled 2023-11-11: qty 3

## 2023-11-11 MED ORDER — DARBEPOETIN ALFA 100 MCG/0.5ML IJ SOSY
100.0000 ug | PREFILLED_SYRINGE | INTRAMUSCULAR | Status: DC
  Administered 2023-11-11: 100 ug via SUBCUTANEOUS
  Filled 2023-11-11: qty 0.5

## 2023-11-11 NOTE — Plan of Care (Signed)

## 2023-11-11 NOTE — Plan of Care (Signed)
  Problem: Health Behavior/Discharge Planning: Goal: Ability to manage health-related needs will improve Outcome: Progressing   Problem: Clinical Measurements: Goal: Will remain free from infection Outcome: Progressing   Problem: Nutrition: Goal: Adequate nutrition will be maintained Outcome: Progressing

## 2023-11-11 NOTE — Progress Notes (Signed)
 [facility]  ROUNDING NOTE   Subjective:   Patient seen laying in bed Covers over head Blood transfusion in place No complaints to offer  Room air   Objective:  Vital signs in last 24 hours:  Temp:  [97.8 F (36.6 C)-98.6 F (37 C)] 98.1 F (36.7 C) (05/31 1145) Pulse Rate:  [59-63] 59 (05/31 1145) Resp:  [16-20] 16 (05/31 1145) BP: (128-140)/(59-68) 131/62 (05/31 1145) SpO2:  [95 %-100 %] 98 % (05/31 1145)  Weight change:  Filed Weights   11/07/23 0500 11/09/23 0403 11/10/23 0500  Weight: 85.4 kg 86.6 kg 87.7 kg    Intake/Output: I/O last 3 completed shifts: In: 1437 [P.O.:1437] Out: 2650 [Urine:2650]   Intake/Output this shift:  Total I/O In: 580 [P.O.:580] Out: 1100 [Urine:1100]  Physical Exam: General: NAD  Head: Normocephalic, atraumatic. Moist oral mucosal membranes  Eyes: Anicteric  Lungs:  Clear to auscultation  Heart: Regular rate and rhythm  Abdomen:  Soft, nontender  Extremities:  No peripheral edema.  Neurologic: Awake, alert, conversant  Skin: Warm,dry, no rash  Access: None    Basic Metabolic Panel: Recent Labs  Lab 11/05/23 0404 11/06/23 0524 11/07/23 0516 11/08/23 0233 11/09/23 0414 11/10/23 0352 11/11/23 0459  NA 132* 131* 136 135 136 135 137  K 4.9 4.3 3.9 4.1 4.6 4.6 4.8  CL 104 102 105 101 103 104 105  CO2 17* 16* 22 23 22 22 23   GLUCOSE 397* 228* 157* 179* 291* 274* 290*  BUN 91* 95* 93* 99* 108* 113* 117*  CREATININE 5.13* 4.85* 4.64* 4.58* 4.86* 4.53* 4.26*  CALCIUM  7.6* 7.5* 7.4* 7.4* 7.9* 8.1* 8.3*  MG 2.1  --  2.0 1.8 2.4 2.5* 2.4  PHOS 5.6* 5.5* 4.5  --   --   --  3.5    Liver Function Tests: Recent Labs  Lab 11/04/23 1545 11/06/23 0524 11/11/23 0459  AST 24  --  20  ALT 12  --  18  ALKPHOS 68  --  50  BILITOT 0.7  --  0.8  PROT 6.6  --  5.8*  ALBUMIN  2.5* 2.4* 2.3*   No results for input(s): "LIPASE", "AMYLASE" in the last 168 hours. No results for input(s): "AMMONIA" in the last 168  hours.  CBC: Recent Labs  Lab 11/04/23 1545 11/05/23 0404 11/06/23 0524 11/07/23 0858 11/11/23 0459  WBC 2.7* 1.3* 5.7 5.1 7.2  NEUTROABS 1.9 0.9*  --   --  5.1  HGB 8.1* 7.2* 7.0* 7.6* 6.8*  HCT 26.2* 21.6* 22.1* 22.8* 21.4*  MCV 93.9 90.0 89.1 88.0 91.1  PLT 165 172 189 216 236    Cardiac Enzymes: No results for input(s): "CKTOTAL", "CKMB", "CKMBINDEX", "TROPONINI" in the last 168 hours.  BNP: Invalid input(s): "POCBNP"  CBG: Recent Labs  Lab 11/10/23 1146 11/10/23 1708 11/10/23 2057 11/11/23 0722 11/11/23 1135  GLUCAP 261* 321* 305* 219* 197*    Microbiology: Results for orders placed or performed during the hospital encounter of 11/04/23  Resp panel by RT-PCR (RSV, Flu A&B, Covid) Anterior Nasal Swab     Status: None   Collection Time: 11/04/23  3:45 PM   Specimen: Anterior Nasal Swab  Result Value Ref Range Status   SARS Coronavirus 2 by RT PCR NEGATIVE NEGATIVE Final    Comment: (NOTE) SARS-CoV-2 target nucleic acids are NOT DETECTED.  The SARS-CoV-2 RNA is generally detectable in upper respiratory specimens during the acute phase of infection. The lowest concentration of SARS-CoV-2 viral copies this assay can  detect is 138 copies/mL. A negative result does not preclude SARS-Cov-2 infection and should not be used as the sole basis for treatment or other patient management decisions. A negative result may occur with  improper specimen collection/handling, submission of specimen other than nasopharyngeal swab, presence of viral mutation(s) within the areas targeted by this assay, and inadequate number of viral copies(<138 copies/mL). A negative result must be combined with clinical observations, patient history, and epidemiological information. The expected result is Negative.  Fact Sheet for Patients:  BloggerCourse.com  Fact Sheet for Healthcare Providers:  SeriousBroker.it  This test is no t yet  approved or cleared by the [place]  FDA and  has been authorized for detection and/or diagnosis of SARS-CoV-2 by FDA under an Emergency Use Authorization (EUA). This EUA will remain  in effect (meaning this test can be used) for the duration of the COVID-19 declaration under Section 564(b)(1) of the Act, 21 U.S.C.section 360bbb-3(b)(1), unless the authorization is terminated  or revoked sooner.       Influenza A by PCR NEGATIVE NEGATIVE Final   Influenza B by PCR NEGATIVE NEGATIVE Final    Comment: (NOTE) The Xpert Xpress SARS-CoV-2/FLU/RSV plus assay is intended as an aid in the diagnosis of influenza from Nasopharyngeal swab specimens and should not be used as a sole basis for treatment. Nasal washings and aspirates are unacceptable for Xpert Xpress SARS-CoV-2/FLU/RSV testing.  Fact Sheet for Patients: BloggerCourse.com  Fact Sheet for Healthcare Providers: SeriousBroker.it  This test is not yet approved or cleared by the [place]  FDA and has been authorized for detection and/or diagnosis of SARS-CoV-2 by FDA under an Emergency Use Authorization (EUA). This EUA will remain in effect (meaning this test can be used) for the duration of the COVID-19 declaration under Section 564(b)(1) of the Act, 21 U.S.C. section 360bbb-3(b)(1), unless the authorization is terminated or revoked.     Resp Syncytial Virus by PCR NEGATIVE NEGATIVE Final    Comment: (NOTE) Fact Sheet for Patients: BloggerCourse.com  Fact Sheet for Healthcare Providers: SeriousBroker.it  This test is not yet approved or cleared by the [place]  FDA and has been authorized for detection and/or diagnosis of SARS-CoV-2 by FDA under an Emergency Use Authorization (EUA). This EUA will remain in effect (meaning this test can be used) for the duration of the COVID-19 declaration under Section 564(b)(1) of  the Act, 21 U.S.C. section 360bbb-3(b)(1), unless the authorization is terminated or revoked.  Performed at [facility], [address] [place], [place] [address]     Coagulation Studies: No results for input(s): "LABPROT", "INR" in the last 72 hours.  Urinalysis: No results for input(s): "COLORURINE", "LABSPEC", "PHURINE", "GLUCOSEU", "HGBUR", "BILIRUBINUR", "KETONESUR", "PROTEINUR", "UROBILINOGEN", "NITRITE", "LEUKOCYTESUR" in the last 72 hours.  Invalid input(s): "APPERANCEUR"    Imaging: No results found.   Medications:     amLODipine   5 mg Oral BID   apixaban   5 mg Oral BID   budesonide -glycopyrrolate -formoterol   2 puff Inhalation BID   carvedilol   12.5 mg Oral BID   cyanocobalamin   1,000 mcg Oral Daily   ferrous sulfate   325 mg Oral QPM   insulin  aspart  0-9 Units Subcutaneous TID WC   insulin  glargine-yfgn  15 Units Subcutaneous Daily   pantoprazole   40 mg Oral Daily   predniSONE   20 mg Oral Q breakfast   rosuvastatin   10 mg Oral QHS   sodium bicarbonate   1,300 mg Oral TID   sodium chloride  flush  3 mL Intravenous Q12H  acetaminophen , HYDROcodone -acetaminophen , ipratropium-albuterol , melatonin, ondansetron  (ZOFRAN ) IV, sodium chloride  flush  Assessment/ Plan:  Larry Callahan is a 68 y.o.  male with past medical conditions including CHF, COPD, CAD, stroke, diabetes, CKDIV and hypertension, who was admitted to [facility] on 11/04/2023 for COPD exacerbation (HCC) [J44.1] AKI (acute kidney injury) (HCC) [N17.9] Acute on chronic HFrEF (heart failure with reduced ejection fraction) (HCC) [I50.23] Acute on chronic congestive heart failure, unspecified heart failure type (HCC) [I50.9] CHF exacerbation (HCC) [I50.9]   Acute kidney injury on chronic kidney disease IV. AKI likely secondary to hemodynamic instability and agressive diuresis. Baseline creatinine appears to be 3.25 with GFR 20 on 10/17/23.  History of solitary kidney.  Patient initially  treated with IV Furosemide  but transitioned to oral. Creatinine continues to improve.  Diuretics remain held.  No acute indication for dialysis, patient states he would not like dialysis as well.   Lab Results  Component Value Date   CREATININE 4.26 (H) 11/11/2023   CREATININE 4.53 (H) 11/10/2023   CREATININE 4.86 (H) 11/09/2023    Intake/Output Summary (Last 24 hours) at 11/11/2023 1359 Last data filed at 11/11/2023 1122 Gross per 24 hour  Intake 1297 ml  Output 2500 ml  Net -1203 ml   2. Chronic systolic heart failure, ECHO from 11/24 has EF 40%. Treated with IV furosemide , then oral. Held due to kidney injury.   3. Anemia of chronic kidney disease Lab Results  Component Value Date   HGB 6.8 (L) 11/11/2023  Hemoglobin 6.8.  Primary team has ordered blood transfusion today.    4. Diabetes mellitus type II with chronic kidney disease/renal manifestations: insulin  dependent. Home regimen includes Lantus  and humalog . Most recent hemoglobin A1c is 6.7 on 09/18/23.   SSI managed by primary team.   LOS: 5 Larry Callahan 5/31/20251:59 PM

## 2023-11-11 NOTE — Progress Notes (Signed)
 PROGRESS NOTE    Larry Callahan  UVO:[id number] DOB: 01-24-1956 DOA: 11/04/2023 PCP: Laurence Pons, NP  Chief Complaint  Patient presents with   Shortness of Breath    Hospital Course:  Larry Callahan is a 68 year old male with heart failure with reduced EF 40% secondary to nonischemic cardiomyopathy, a flutter status post ablation on Eliquis , CKD stage IV, hypertension, type 2 diabetes, COPD, OSA, HLD, nonobstructive CAD, CVA, who presents to the ED with shortness of breath.  He was admitted for CHF exacerbation.  Patient also had recent admissions for COPD exacerbations with pneumonia.  Patient was started on IV and had excellent urine output.  He continued to have dyspnea and was started on treatment for COPD exacerbation.  Subjective: No acute events overnight. Patient is sleepy today, consents to blood work.  Objective: Vitals:   11/11/23 0711 11/11/23 1115 11/11/23 1136 11/11/23 1145  BP:  136/67 133/63 131/62  Pulse:  61 60 (!) 59  Resp:  16 16 16   Temp:  98 F (36.7 C) 98 F (36.7 C) 98.1 F (36.7 C)  TempSrc:  Oral  Oral  SpO2: 98% 98% 100% 98%  Weight:      Height:        Intake/Output Summary (Last 24 hours) at 11/11/2023 1238 Last data filed at 11/11/2023 0941 Gross per 24 hour  Intake 1197 ml  Output 2500 ml  Net -1303 ml   Filed Weights   11/07/23 0500 11/09/23 0403 11/10/23 0500  Weight: 85.4 kg 86.6 kg 87.7 kg    Examination: General exam: Appears calm and comfortable, NAD, sleeping Respiratory system: No work of breathing, symmetric chest wall expansion.  No wheezing today.  On room air cardiovascular system: S1 & S2 heard, RRR.  Gastrointestinal system: Abdomen is nondistended, soft and nontender.  Neuro: Alert and oriented. No focal neurological deficits. Extremities: Symmetric, expected ROM Skin: No rashes, lesions Psychiatry: Demonstrates appropriate judgement and insight. Mood & affect appropriate for situation.   Assessment  & Plan:  Principal Problem:   Acute on chronic HFrEF (heart failure with reduced ejection fraction) (HCC) Active Problems:   Acute kidney injury superimposed on chronic kidney disease (HCC)   COPD (chronic obstructive pulmonary disease) (HCC)   Essential hypertension   Type 2 diabetes mellitus (HCC)   Atrial flutter (HCC)   Metabolic acidosis, normal anion gap (NAG)   CHF exacerbation (HCC)   Acute on chronic heart failure with reduced EF - November 2024: EF 40%, suspected secondary to a flutter -BNP elevated above baseline - Had pulmonary edema on arrival, no lower extremity edema - Was initially on IV Lasix , transitioned to p.o. on 5/27, discontinued entirely 5/29 - Continue to follow strict I's and O's - Volume status currently stable without Lasix   Back pain - Chronic.  Ongoing.  Likely MSK etiology - Continue with muscle relaxers as needed, heating pad, out of bed frequently - Continue PT/OT - Continue Tylenol  and Norco - Unlikely cardiac etiology given normal EKG and troponin  AKI superimposed on CKD stage IV - Baseline creatinine near 3.5, acute worsening on arrival to 5.2 - Patient is a poor historian, reports he has right kidney only due to "masses" on the other.  He does not follow with a nephrologist outpatient - Creatinine is downtrending, not quite to baseline but improving. - Slow resolution given diuresis - Currently off all diuretics - Nephrology recommends continued monitoring - Patient is nearing the need for dialysis but nonurgently - Continue to hold nephrotoxic  meds  Headache, resolved - As needed Tylenol .  Status post Fioricet  x 1  COPD, in exacerbation -On room air, continue close monitoring of respiratory status - Continue with prednisone  to complete 5-day course - Continue with scheduled DuoNebs - No wheezing on exam  Leukopenia - WBC 1.3 on arrival - WBC is resolved now - Afebrile  Metabolic acidosis, normal anion gap - Resume home dose  bicarb  Anemia of chronic disease - Hemoglobin has gradually downtrended, under 7 now - Hemoglobin 6.8.  Today.  1 unit PRBC ordered - No active signs of bleeding - Suspect this is all secondary to slow gradual decline from CKD  A flutter - Status post ablation [facility] in December 2024 - EKG: Junctional versus sinus rhythm, consistent with prior - Continue home meds  Type 2 diabetes - Hold home regimen for now - Continue basal/bolus. - Follow CBGs, titrate daily.    Hypertension - Blood pressure better controlled - Gradually resume BP meds as needed  DVT prophylaxis: Eliquis    Code Status: Full Code Disposition: Receiving blood transfusion today.  Continue to monitor kidney function.  Consultants:  Treatment Team:  Consulting Physician: Lateef, Munsoor, MD  Procedures:    Antimicrobials:  Anti-infectives (From admission, onward)    Start     Dose/Rate Route Frequency Ordered Stop   11/07/23 1730  azithromycin  (ZITHROMAX ) tablet 500 mg  Status:  Discontinued        500 mg Oral Daily 11/07/23 1634 11/08/23 1318       Data Reviewed: I have personally reviewed following labs and imaging studies CBC: Recent Labs  Lab 11/04/23 1545 11/05/23 0404 11/06/23 0524 11/07/23 0858 11/11/23 0459  WBC 2.7* 1.3* 5.7 5.1 7.2  NEUTROABS 1.9 0.9*  --   --  5.1  HGB 8.1* 7.2* 7.0* 7.6* 6.8*  HCT 26.2* 21.6* 22.1* 22.8* 21.4*  MCV 93.9 90.0 89.1 88.0 91.1  PLT 165 172 189 216 236   Basic Metabolic Panel: Recent Labs  Lab 11/05/23 0404 11/06/23 0524 11/07/23 0516 11/08/23 0233 11/09/23 0414 11/10/23 0352 11/11/23 0459  NA 132* 131* 136 135 136 135 137  K 4.9 4.3 3.9 4.1 4.6 4.6 4.8  CL 104 102 105 101 103 104 105  CO2 17* 16* 22 23 22 22 23   GLUCOSE 397* 228* 157* 179* 291* 274* 290*  BUN 91* 95* 93* 99* 108* 113* 117*  CREATININE 5.13* 4.85* 4.64* 4.58* 4.86* 4.53* 4.26*  CALCIUM  7.6* 7.5* 7.4* 7.4* 7.9* 8.1* 8.3*  MG 2.1  --  2.0 1.8 2.4 2.5* 2.4  PHOS 5.6* 5.5*  4.5  --   --   --  3.5   GFR: Estimated Creatinine Clearance: 17.2 mL/min (A) (by C-G formula based on SCr of 4.26 mg/dL (H)). Liver Function Tests: Recent Labs  Lab 11/04/23 1545 11/06/23 0524 11/11/23 0459  AST 24  --  20  ALT 12  --  18  ALKPHOS 68  --  50  BILITOT 0.7  --  0.8  PROT 6.6  --  5.8*  ALBUMIN  2.5* 2.4* 2.3*   CBG: Recent Labs  Lab 11/10/23 1146 11/10/23 1708 11/10/23 2057 11/11/23 0722 11/11/23 1135  GLUCAP 261* 321* 305* 219* 197*    Recent Results (from the past 240 hours)  Resp panel by RT-PCR (RSV, Flu A&B, Covid) Anterior Nasal Swab     Status: None   Collection Time: 11/04/23  3:45 PM   Specimen: Anterior Nasal Swab  Result Value Ref Range Status  SARS Coronavirus 2 by RT PCR NEGATIVE NEGATIVE Final    Comment: (NOTE) SARS-CoV-2 target nucleic acids are NOT DETECTED.  The SARS-CoV-2 RNA is generally detectable in upper respiratory specimens during the acute phase of infection. The lowest concentration of SARS-CoV-2 viral copies this assay can detect is 138 copies/mL. A negative result does not preclude SARS-Cov-2 infection and should not be used as the sole basis for treatment or other patient management decisions. A negative result may occur with  improper specimen collection/handling, submission of specimen other than nasopharyngeal swab, presence of viral mutation(s) within the areas targeted by this assay, and inadequate number of viral copies(<138 copies/mL). A negative result must be combined with clinical observations, patient history, and epidemiological information. The expected result is Negative.  Fact Sheet for Patients:  BloggerCourse.com  Fact Sheet for Healthcare Providers:  SeriousBroker.it  This test is no t yet approved or cleared by the [place]  FDA and  has been authorized for detection and/or diagnosis of SARS-CoV-2 by FDA under an Emergency Use Authorization  (EUA). This EUA will remain  in effect (meaning this test can be used) for the duration of the COVID-19 declaration under Section 564(b)(1) of the Act, 21 U.S.C.section 360bbb-3(b)(1), unless the authorization is terminated  or revoked sooner.       Influenza A by PCR NEGATIVE NEGATIVE Final   Influenza B by PCR NEGATIVE NEGATIVE Final    Comment: (NOTE) The Xpert Xpress SARS-CoV-2/FLU/RSV plus assay is intended as an aid in the diagnosis of influenza from Nasopharyngeal swab specimens and should not be used as a sole basis for treatment. Nasal washings and aspirates are unacceptable for Xpert Xpress SARS-CoV-2/FLU/RSV testing.  Fact Sheet for Patients: BloggerCourse.com  Fact Sheet for Healthcare Providers: SeriousBroker.it  This test is not yet approved or cleared by the [place]  FDA and has been authorized for detection and/or diagnosis of SARS-CoV-2 by FDA under an Emergency Use Authorization (EUA). This EUA will remain in effect (meaning this test can be used) for the duration of the COVID-19 declaration under Section 564(b)(1) of the Act, 21 U.S.C. section 360bbb-3(b)(1), unless the authorization is terminated or revoked.     Resp Syncytial Virus by PCR NEGATIVE NEGATIVE Final    Comment: (NOTE) Fact Sheet for Patients: BloggerCourse.com  Fact Sheet for Healthcare Providers: SeriousBroker.it  This test is not yet approved or cleared by the [place]  FDA and has been authorized for detection and/or diagnosis of SARS-CoV-2 by FDA under an Emergency Use Authorization (EUA). This EUA will remain in effect (meaning this test can be used) for the duration of the COVID-19 declaration under Section 564(b)(1) of the Act, 21 U.S.C. section 360bbb-3(b)(1), unless the authorization is terminated or revoked.  Performed at [facility], [address]  [place], [place] [address]      Radiology Studies: No results found.   Scheduled Meds:  amLODipine   5 mg Oral BID   apixaban   5 mg Oral BID   budesonide -glycopyrrolate -formoterol   2 puff Inhalation BID   carvedilol   12.5 mg Oral BID   cyanocobalamin   1,000 mcg Oral Daily   ferrous sulfate   325 mg Oral QPM   insulin  aspart  0-9 Units Subcutaneous TID WC   insulin  glargine-yfgn  15 Units Subcutaneous Daily   pantoprazole   40 mg Oral Daily   predniSONE   20 mg Oral Q breakfast   rosuvastatin   10 mg Oral QHS   sodium bicarbonate   1,300 mg Oral TID   sodium chloride  flush  3 mL Intravenous Q12H   Continuous Infusions:     LOS: 5 days  MDM: Patient is high risk for one or more organ failure.  They necessitate ongoing hospitalization for continued IV therapies and subsequent lab monitoring. Total time spent interpreting labs and vitals, reviewing the medical record, coordinating care amongst consultants and care team members, directly assessing and discussing care with the patient and/or family: 55 min  Richad Ramsay, DO [facility]  To contact the attending physician between 7A-7P please use Epic Chat. To contact the covering physician during after hours 7P-7A, please review Amion.  11/11/2023, 12:38 PM   *This document has been created with the assistance of dictation software. Please excuse typographical errors. *

## 2023-11-12 DIAGNOSIS — I5023 Acute on chronic systolic (congestive) heart failure: Secondary | ICD-10-CM | POA: Diagnosis not present

## 2023-11-12 DIAGNOSIS — N179 Acute kidney failure, unspecified: Secondary | ICD-10-CM | POA: Diagnosis not present

## 2023-11-12 DIAGNOSIS — I509 Heart failure, unspecified: Secondary | ICD-10-CM | POA: Diagnosis not present

## 2023-11-12 DIAGNOSIS — J438 Other emphysema: Secondary | ICD-10-CM | POA: Diagnosis not present

## 2023-11-12 LAB — CBC WITH DIFFERENTIAL/PLATELET
Abs Immature Granulocytes: 0.2 10*3/uL — ABNORMAL HIGH (ref 0.00–0.07)
Basophils Absolute: 0 10*3/uL (ref 0.0–0.1)
Basophils Relative: 0 %
Eosinophils Absolute: 0 10*3/uL (ref 0.0–0.5)
Eosinophils Relative: 0 %
HCT: 24.9 % — ABNORMAL LOW (ref 39.0–52.0)
Hemoglobin: 8 g/dL — ABNORMAL LOW (ref 13.0–17.0)
Immature Granulocytes: 2 %
Lymphocytes Relative: 19 %
Lymphs Abs: 1.7 10*3/uL (ref 0.7–4.0)
MCH: 29.5 pg (ref 26.0–34.0)
MCHC: 32.1 g/dL (ref 30.0–36.0)
MCV: 91.9 fL (ref 80.0–100.0)
Monocytes Absolute: 0.7 10*3/uL (ref 0.1–1.0)
Monocytes Relative: 8 %
Neutro Abs: 6.4 10*3/uL (ref 1.7–7.7)
Neutrophils Relative %: 71 %
Platelets: 254 10*3/uL (ref 150–400)
RBC: 2.71 MIL/uL — ABNORMAL LOW (ref 4.22–5.81)
RDW: 13.8 % (ref 11.5–15.5)
WBC: 9.2 10*3/uL (ref 4.0–10.5)
nRBC: 0 % (ref 0.0–0.2)

## 2023-11-12 LAB — TYPE AND SCREEN
ABO/RH(D): A POS
Antibody Screen: NEGATIVE
Unit division: 0

## 2023-11-12 LAB — IRON AND TIBC
Iron: 101 ug/dL (ref 45–182)
Saturation Ratios: 38 % (ref 17.9–39.5)
TIBC: 269 ug/dL (ref 250–450)
UIBC: 168 ug/dL

## 2023-11-12 LAB — COMPREHENSIVE METABOLIC PANEL WITH GFR
ALT: 27 U/L (ref 0–44)
AST: 24 U/L (ref 15–41)
Albumin: 2.3 g/dL — ABNORMAL LOW (ref 3.5–5.0)
Alkaline Phosphatase: 52 U/L (ref 38–126)
Anion gap: 10 (ref 5–15)
BUN: 120 mg/dL — ABNORMAL HIGH (ref 8–23)
CO2: 22 mmol/L (ref 22–32)
Calcium: 8.5 mg/dL — ABNORMAL LOW (ref 8.9–10.3)
Chloride: 106 mmol/L (ref 98–111)
Creatinine, Ser: 3.95 mg/dL — ABNORMAL HIGH (ref 0.61–1.24)
GFR, Estimated: 16 mL/min — ABNORMAL LOW (ref >60)
Glucose, Bld: 216 mg/dL — ABNORMAL HIGH (ref 70–99)
Potassium: 5.2 mmol/L — ABNORMAL HIGH (ref 3.5–5.1)
Sodium: 138 mmol/L (ref 135–145)
Total Bilirubin: 0.5 mg/dL (ref 0.0–1.2)
Total Protein: 5.9 g/dL — ABNORMAL LOW (ref 6.5–8.1)

## 2023-11-12 LAB — MAGNESIUM: Magnesium: 2.3 mg/dL (ref 1.7–2.4)

## 2023-11-12 LAB — GLUCOSE, CAPILLARY
Glucose-Capillary: 155 mg/dL — ABNORMAL HIGH (ref 70–99)
Glucose-Capillary: 240 mg/dL — ABNORMAL HIGH (ref 70–99)
Glucose-Capillary: 315 mg/dL — ABNORMAL HIGH (ref 70–99)

## 2023-11-12 LAB — PHOSPHORUS: Phosphorus: 3.2 mg/dL (ref 2.5–4.6)

## 2023-11-12 LAB — BPAM RBC
Blood Product Expiration Date: 202506272359
ISSUE DATE / TIME: 202505311100
Unit Type and Rh: 6200

## 2023-11-12 LAB — FERRITIN: Ferritin: 73 ng/mL (ref 24–336)

## 2023-11-12 MED ORDER — SODIUM ZIRCONIUM CYCLOSILICATE 10 G PO PACK
10.0000 g | PACK | Freq: Once | ORAL | Status: AC
  Administered 2023-11-12: 10 g via ORAL
  Filled 2023-11-12: qty 1

## 2023-11-12 NOTE — Plan of Care (Signed)
  Problem: Education: Goal: Knowledge of General Education information will improve Description Including pain rating scale, medication(s)/side effects and non-pharmacologic comfort measures Outcome: Progressing   Problem: Health Behavior/Discharge Planning: Goal: Ability to manage health-related needs will improve Outcome: Progressing   Problem: Clinical Measurements: Goal: Ability to maintain clinical measurements within normal limits will improve Outcome: Progressing   Problem: Activity: Goal: Risk for activity intolerance will decrease Outcome: Progressing   Problem: Safety: Goal: Ability to remain free from injury will improve Outcome: Progressing   

## 2023-11-12 NOTE — Progress Notes (Signed)
 PROGRESS NOTE    Larry Callahan  WGN:[id number] DOB: 1955-07-12 DOA: 11/04/2023 PCP: Laurence Pons, NP  Chief Complaint  Patient presents with   Shortness of Breath    Hospital Course:  Rayhaan Huster Vancott is a 68 year old male with heart failure with reduced EF 40% secondary to nonischemic cardiomyopathy, a flutter status post ablation on Eliquis , CKD stage IV, hypertension, type 2 diabetes, COPD, OSA, HLD, nonobstructive CAD, CVA, who presents to the ED with shortness of breath.  He was admitted for CHF exacerbation.  Patient also had recent admissions for COPD exacerbations with pneumonia.  Patient was started on IV and had excellent urine output.  He continued to have dyspnea and was started on treatment for COPD exacerbation.  Subjective: Patient reports he is feeling short of breath today.  Reports he is feeling too unsteady to discharge home.  Per his conversation with nephrology he believes he needs another day for monitoring.  Objective: Vitals:   11/12/23 0454 11/12/23 0828 11/12/23 1137 11/12/23 1643  BP:  (!) 158/79  (!) 140/65  Pulse:  (!) 56  (!) 58  Resp:  18  18  Temp:  98 F (36.7 C)  97.9 F (36.6 C)  TempSrc:      SpO2:  98% 98% 100%  Weight: 88.6 kg     Height:        Intake/Output Summary (Last 24 hours) at 11/12/2023 1657 Last data filed at 11/12/2023 0900 Gross per 24 hour  Intake 480 ml  Output 2100 ml  Net -1620 ml   Filed Weights   11/09/23 0403 11/10/23 0500 11/12/23 0454  Weight: 86.6 kg 87.7 kg 88.6 kg    Examination: General exam: Appears calm and comfortable, NAD, sleeping Respiratory system: No work of breathing, symmetric chest wall expansion.  No wheezing today.  On room air cardiovascular system: S1 & S2 heard, RRR.  Gastrointestinal system: Abdomen is nondistended, soft and nontender.  Neuro: Alert and oriented. No focal neurological deficits. Extremities: Symmetric, expected ROM Skin: No rashes, lesions Psychiatry:  Demonstrates appropriate judgement and insight. Mood & affect appropriate for situation.   Assessment & Plan:  Principal Problem:   Acute on chronic HFrEF (heart failure with reduced ejection fraction) (HCC) Active Problems:   Acute kidney injury superimposed on chronic kidney disease (HCC)   COPD (chronic obstructive pulmonary disease) (HCC)   Essential hypertension   Type 2 diabetes mellitus (HCC)   Atrial flutter (HCC)   Metabolic acidosis, normal anion gap (NAG)   CHF exacerbation (HCC)   Acute on chronic heart failure with reduced EF - November 2024: EF 40%, suspected secondary to a flutter -BNP elevated above baseline - Had pulmonary edema on arrival, no lower extremity edema - Was initially on IV Lasix , transitioned to p.o. on 5/27, discontinued entirely 5/29 - Patient is experiencing increased shortness of breath today, no crackles appreciated on exam.  No significant lower extremity edema - Continue to monitor volume status closely - Continue to follow strict I's and O's - Currently volume status is stable without Lasix .  Back pain - Chronic.  Ongoing.  Likely MSK etiology - Continue with muscle relaxers as needed, heating pad, out of bed frequently - PT/OT as tolerated - As needed Tylenol  and Norco - Unlikely cardiac etiology given normal EKG, troponin within normal limits, and chronicity.  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  He has not seen a nephrologist consistently - Creatinine downtrending now, nearing baseline - Continue to hold diuretics - Nephrology is consulted and has concerns of imminent dialysis though nonurgently - Output remains strong - Continue to hold all nephrotoxic medications  Headache, resolved - As needed Tylenol .  Status post Fioricet  x 1  COPD, in exacerbation - Remains on room  air - Continue close monitoring of respiratory status - s/p 5-day prednisone  course - Continue with scheduled DuoNebs - No wheezing today though patient endorses feeling increased shortness of breath  Leukopenia - WBC 1.3 on arrival - WBC is resolved now - Afebrile  Metabolic acidosis, normal anion gap - On bicarb per renal  Anemia of chronic disease - Hgb has been gradually downtrending.  Baseline is just above 7 - 1 unit PRBC on 5/31 - Hemoglobin rose expectedly after transfusion - No active signs of bleeding - Continue to monitor CBC closely  A flutter - Status post ablation [facility] in December 2024 - EKG: Junctional versus sinus rhythm, consistent with prior - Continue home meds  Type 2 diabetes - Hold home regimen for now - Continue basal/bolus. - Continue to follow CBGs, titrate insulin  needs daily. - Increased Hyperglycemia likely result of prednisone  which patient has now completed.  Anticipate increased insulin  needs will decrease now  Hypertension - Blood pressure better controlled - Follow closely and titrate as needed   DVT prophylaxis: Eliquis    Code Status: Full Code Disposition: DC home with HH tomorrow  Consultants:  Treatment Team:  Consulting Physician: Lateef, Munsoor, MD  Procedures:    Antimicrobials:  Anti-infectives (From admission, onward)    Start     Dose/Rate Route Frequency Ordered Stop   11/07/23 1730  azithromycin  (ZITHROMAX ) tablet 500 mg  Status:  Discontinued        500 mg Oral Daily 11/07/23 1634 11/08/23 1318       Data Reviewed: I have personally reviewed following labs and imaging studies CBC: Recent Labs  Lab 11/06/23 0524 11/07/23 0858 11/11/23 0459 11/12/23 0443  WBC 5.7 5.1 7.2 9.2  NEUTROABS  --   --  5.1 6.4  HGB 7.0* 7.6* 6.8* 8.0*  HCT 22.1* 22.8* 21.4* 24.9*  MCV 89.1 88.0 91.1 91.9  PLT 189 216 236 254   Basic Metabolic Panel: Recent Labs  Lab 11/06/23 0524 11/06/23 0524 11/07/23 0516  11/08/23 0233 11/09/23 0414 11/10/23 0352 11/11/23 0459 11/12/23 0443  NA 131*  --  136 135 136 135 137 138  K 4.3  --  3.9 4.1 4.6 4.6 4.8 5.2*  CL 102  --  105 101 103 104 105 106  CO2 16*  --  22 23 22 22 23 22   GLUCOSE 228*  --  157* 179* 291* 274* 290* 216*  BUN 95*  --  93* 99* 108* 113* 117* 120*  CREATININE 4.85*  --  4.64* 4.58* 4.86* 4.53* 4.26* 3.95*  CALCIUM  7.5*  --  7.4* 7.4* 7.9* 8.1* 8.3* 8.5*  MG  --    < > 2.0 1.8 2.4 2.5* 2.4 2.3  PHOS 5.5*  --  4.5  --   --   --  3.5 3.2   < > = values in this interval not displayed.   GFR: Estimated Creatinine Clearance: 18.5 mL/min (A) (by C-G formula based on SCr of 3.95 mg/dL (H)). Liver Function Tests: Recent Labs  Lab 11/06/23 0524 11/11/23 0459 11/12/23 0443  AST  --  20 24  ALT  --  18 27  ALKPHOS  --  50 52  BILITOT  --  0.8 0.5  PROT  --  5.8* 5.9*  ALBUMIN  2.4* 2.3* 2.3*   CBG: Recent Labs  Lab 11/11/23 1657 11/11/23 2127 11/12/23 0828 11/12/23 1126 11/12/23 1644  GLUCAP 350* 377* 155* 240* 315*    Recent Results (from the past 240 hours)  Resp panel by RT-PCR (RSV, Flu A&B, Covid) Anterior Nasal Swab     Status: None   Collection Time: 11/04/23  3:45 PM   Specimen: Anterior Nasal Swab  Result Value Ref Range Status   SARS Coronavirus 2 by RT PCR NEGATIVE NEGATIVE Final    Comment: (NOTE) SARS-CoV-2 target nucleic acids are NOT DETECTED.  The SARS-CoV-2 RNA is generally detectable in upper respiratory specimens during the acute phase of infection. The lowest concentration of SARS-CoV-2 viral copies this assay can detect is 138 copies/mL. A negative result does not preclude SARS-Cov-2 infection and should not be used as the sole basis for treatment or other patient management decisions. A negative result may occur with  improper specimen collection/handling, submission of specimen other than nasopharyngeal swab, presence of viral mutation(s) within the areas targeted by this assay, and  inadequate number of viral copies(<138 copies/mL). A negative result must be combined with clinical observations, patient history, and epidemiological information. The expected result is Negative.  Fact Sheet for Patients:  BloggerCourse.com  Fact Sheet for Healthcare Providers:  SeriousBroker.it  This test is no t yet approved or cleared by the [place]  FDA and  has been authorized for detection and/or diagnosis of SARS-CoV-2 by FDA under an Emergency Use Authorization (EUA). This EUA will remain  in effect (meaning this test can be used) for the duration of the COVID-19 declaration under Section 564(b)(1) of the Act, 21 U.S.C.section 360bbb-3(b)(1), unless the authorization is terminated  or revoked sooner.       Influenza A by PCR NEGATIVE NEGATIVE Final   Influenza B by PCR NEGATIVE NEGATIVE Final    Comment: (NOTE) The Xpert Xpress SARS-CoV-2/FLU/RSV plus assay is intended as an aid in the diagnosis of influenza from Nasopharyngeal swab specimens and should not be used as a sole basis for treatment. Nasal washings and aspirates are unacceptable for Xpert Xpress SARS-CoV-2/FLU/RSV testing.  Fact Sheet for Patients: BloggerCourse.com  Fact Sheet for Healthcare Providers: SeriousBroker.it  This test is not yet approved or cleared by the [place]  FDA and has been authorized for detection and/or diagnosis of SARS-CoV-2 by FDA under an Emergency Use Authorization (EUA). This EUA will remain in effect (meaning this test can be used) for the duration of the COVID-19 declaration under Section 564(b)(1) of the Act, 21 U.S.C. section 360bbb-3(b)(1), unless the authorization is terminated or revoked.     Resp Syncytial Virus by PCR NEGATIVE NEGATIVE Final    Comment: (NOTE) Fact Sheet for Patients: BloggerCourse.com  Fact Sheet for Healthcare  Providers: SeriousBroker.it  This test is not yet approved or cleared by the [place]  FDA and has been authorized for detection and/or diagnosis of SARS-CoV-2 by FDA under an Emergency Use Authorization (EUA). This EUA will remain in effect (meaning this test can be used) for the duration of the COVID-19 declaration under Section 564(b)(1) of the Act, 21 U.S.C. section 360bbb-3(b)(1), unless the authorization is terminated or revoked.  Performed at [facility], [address], [place], [place] [address]      Radiology Studies: No results found.  Scheduled Meds:  amLODipine   5 mg Oral BID   apixaban   5 mg Oral BID   budesonide -glycopyrrolate -formoterol   2 puff Inhalation BID   carvedilol   12.5 mg Oral BID   cyanocobalamin   1,000 mcg Oral Daily   darbepoetin (ARANESP) injection - NON-DIALYSIS  100 mcg Subcutaneous Q Sat-1800   ferrous sulfate   325 mg Oral QPM   insulin  aspart  0-9 Units Subcutaneous TID WC   insulin  glargine-yfgn  15 Units Subcutaneous Daily   pantoprazole   40 mg Oral Daily   rosuvastatin   10 mg Oral QHS   sodium bicarbonate   1,300 mg Oral BID   sodium chloride  flush  3 mL Intravenous Q12H   Continuous Infusions:     LOS: 6 days  MDM: Patient is high risk for one or more organ failure.  They necessitate ongoing hospitalization for continued IV therapies and subsequent lab monitoring. Total time spent interpreting labs and vitals, reviewing the medical record, coordinating care amongst consultants and care team members, directly assessing and discussing care with the patient and/or family: 55 min  Katryna Tschirhart, DO [facility]  To contact the attending physician between 7A-7P please use Epic Chat. To contact the covering physician during after hours 7P-7A, please review Amion.  11/12/2023, 4:57 PM   *This document has been created with the assistance of dictation software. Please excuse typographical errors.  *

## 2023-11-12 NOTE — Progress Notes (Signed)
 Physical Therapy Treatment Patient Details Name: Larry Callahan MRN: [id number] DOB: 11-Jan-1956 Today's Date: 11/12/2023   History of Present Illness Pt is a 68 y/o male admitted secondary to SOB and found to have acute on chronic CHF, AKI and COPD exacerbation. PMH including but not limited to HFrEF with last EF of 40% secondary to nonischemic cardiomyopathy, atrial flutter s/p ablation on Eliquis , CKD stage IV, hypertension, type 2 diabetes, COPD, OSA, hyperlipidemia, nonobstructive CAD, CVA.    PT Comments  Patient alert, blanket pulled over his head. Reported 4/10 L shoulder pain, hot pack adjusted at end of session to help address complaints. Bed mobility remained modI, sit <> stand with RW supervision, did need second attempt to come up into standing. He ambulated ~560ft, supervision. RW does tend to be outside BOS and pt with little desire to adjust for safety. Returned to room with needs in reach. The patient would benefit from further skilled PT intervention to continue to progress towards goals and establishment of HEP.     If plan is discharge home, recommend the following: A little help with walking and/or transfers;A little help with bathing/dressing/bathroom;Assistance with cooking/housework;Supervision due to cognitive status;Assist for transportation   Can travel by private vehicle     Yes  Equipment Recommendations  None recommended by PT    Recommendations for Other Services       Precautions / Restrictions Precautions Precautions: Fall Recall of Precautions/Restrictions: Impaired Restrictions Weight Bearing Restrictions Per Provider Order: No     Mobility  Bed Mobility Overal bed mobility: Modified Independent                  Transfers Overall transfer level: Needs assistance Equipment used: Rolling walker (2 wheels) Transfers: Sit to/from Stand Sit to Stand: Supervision                Ambulation/Gait Ambulation/Gait assistance:  Supervision Gait Distance (Feet): 500 Feet Assistive device: Rolling walker (2 wheels)         General Gait Details: Pt tending to lean forward and push walker ahead, decreased gait velocity   Stairs             Wheelchair Mobility     Tilt Bed    Modified Rankin (Stroke Patients Only)       Balance Overall balance assessment: Needs assistance Sitting-balance support: Feet supported Sitting balance-Leahy Scale: Good     Standing balance support: During functional activity, Single extremity supported, Bilateral upper extremity supported Standing balance-Leahy Scale: Good                              Communication    Cognition Arousal: Alert Behavior During Therapy: WFL for tasks assessed/performed                               Following commands impaired: Only follows one step commands consistently    Cueing Cueing Techniques: Verbal cues  Exercises      General Comments        Pertinent Vitals/Pain Pain Assessment Pain Assessment: 0-10 Pain Score: 4  Pain Location: L shoulder Pain Descriptors / Indicators: Sore Pain Intervention(s): Limited activity within patient's tolerance, Monitored during session, Repositioned, Heat applied    Home Living  Prior Function            PT Goals (current goals can now be found in the care plan section) Progress towards PT goals: Progressing toward goals    Frequency           PT Plan      Co-evaluation              AM-PAC PT "6 Clicks" Mobility   Outcome Measure  Help needed turning from your back to your side while in a flat bed without using bedrails?: None Help needed moving from lying on your back to sitting on the side of a flat bed without using bedrails?: None Help needed moving to and from a bed to a chair (including a wheelchair)?: None Help needed standing up from a chair using your arms (e.g., wheelchair or bedside  chair)?: None Help needed to walk in hospital room?: None Help needed climbing 3-5 steps with a railing? : A Little 6 Click Score: 23    End of Session Equipment Utilized During Treatment: Gait belt Activity Tolerance: Patient tolerated treatment well Patient left: in bed;with call bell/phone within reach;Other (comment) Nurse Communication: Mobility status PT Visit Diagnosis: Other abnormalities of gait and mobility (R26.89)     Time: 8295-6213 PT Time Calculation (min) (ACUTE ONLY): 15 min  Charges:    $Therapeutic Activity: 8-22 mins PT General Charges $$ ACUTE PT VISIT: 1 Visit                     Darien Eden PT, DPT 3:06 PM,11/12/23

## 2023-11-12 NOTE — Plan of Care (Signed)

## 2023-11-12 NOTE — Progress Notes (Addendum)
 [facility]  ROUNDING NOTE   Subjective:   Patient seen laying in bed Alert Requesting breathing treatment Denies pain Appetite appropriate  Creatinine 3.95  Objective:  Vital signs in last 24 hours:  Temp:  [97.1 F (36.2 C)-98.6 F (37 C)] 98 F (36.7 C) (06/01 0828) Pulse Rate:  [56-67] 56 (06/01 0828) Resp:  [16-18] 18 (06/01 0828) BP: (131-158)/(62-81) 158/79 (06/01 0828) SpO2:  [97 %-100 %] 98 % (06/01 0828) Weight:  [88.6 kg] 88.6 kg (06/01 0454)  Weight change:  Filed Weights   11/09/23 0403 11/10/23 0500 11/12/23 0454  Weight: 86.6 kg 87.7 kg 88.6 kg    Intake/Output: I/O last 3 completed shifts: In: 1800 [P.O.:1300; Blood:500] Out: 4500 [Urine:4500]   Intake/Output this shift:  Total I/O In: 240 [P.O.:240] Out: -   Physical Exam: General: NAD  Head: Normocephalic, atraumatic. Moist oral mucosal membranes  Eyes: Anicteric  Lungs:  Coarse, normal effort  Heart: Regular rate and rhythm  Abdomen:  Soft, nontender  Extremities:  No peripheral edema.  Neurologic: Awake, alert, conversant  Skin: Warm,dry, no rash  Access: None    Basic Metabolic Panel: Recent Labs  Lab 11/06/23 0524 11/06/23 0524 11/07/23 0516 11/08/23 0233 11/09/23 0414 11/10/23 0352 11/11/23 0459 11/12/23 0443  NA 131*  --  136 135 136 135 137 138  K 4.3  --  3.9 4.1 4.6 4.6 4.8 5.2*  CL 102  --  105 101 103 104 105 106  CO2 16*  --  22 23 22 22 23 22   GLUCOSE 228*  --  157* 179* 291* 274* 290* 216*  BUN 95*  --  93* 99* 108* 113* 117* 120*  CREATININE 4.85*  --  4.64* 4.58* 4.86* 4.53* 4.26* 3.95*  CALCIUM  7.5*  --  7.4* 7.4* 7.9* 8.1* 8.3* 8.5*  MG  --    < > 2.0 1.8 2.4 2.5* 2.4 2.3  PHOS 5.5*  --  4.5  --   --   --  3.5 3.2   < > = values in this interval not displayed.    Liver Function Tests: Recent Labs  Lab 11/06/23 0524 11/11/23 0459 11/12/23 0443  AST  --  20 24  ALT  --  18 27  ALKPHOS  --  50 52  BILITOT  --  0.8 0.5  PROT  --  5.8*  5.9*  ALBUMIN  2.4* 2.3* 2.3*   No results for input(s): "LIPASE", "AMYLASE" in the last 168 hours. No results for input(s): "AMMONIA" in the last 168 hours.  CBC: Recent Labs  Lab 11/06/23 0524 11/07/23 0858 11/11/23 0459 11/12/23 0443  WBC 5.7 5.1 7.2 9.2  NEUTROABS  --   --  5.1 6.4  HGB 7.0* 7.6* 6.8* 8.0*  HCT 22.1* 22.8* 21.4* 24.9*  MCV 89.1 88.0 91.1 91.9  PLT 189 216 236 254    Cardiac Enzymes: No results for input(s): "CKTOTAL", "CKMB", "CKMBINDEX", "TROPONINI" in the last 168 hours.  BNP: Invalid input(s): "POCBNP"  CBG: Recent Labs  Lab 11/11/23 1135 11/11/23 1656 11/11/23 1657 11/11/23 2127 11/12/23 0828  GLUCAP 197* 402* 350* 377* 155*    Microbiology: Results for orders placed or performed during the hospital encounter of 11/04/23  Resp panel by RT-PCR (RSV, Flu A&B, Covid) Anterior Nasal Swab     Status: None   Collection Time: 11/04/23  3:45 PM   Specimen: Anterior Nasal Swab  Result Value Ref Range Status   SARS Coronavirus 2 by RT PCR NEGATIVE  NEGATIVE Final    Comment: (NOTE) SARS-CoV-2 target nucleic acids are NOT DETECTED.  The SARS-CoV-2 RNA is generally detectable in upper respiratory specimens during the acute phase of infection. The lowest concentration of SARS-CoV-2 viral copies this assay can detect is 138 copies/mL. A negative result does not preclude SARS-Cov-2 infection and should not be used as the sole basis for treatment or other patient management decisions. A negative result may occur with  improper specimen collection/handling, submission of specimen other than nasopharyngeal swab, presence of viral mutation(s) within the areas targeted by this assay, and inadequate number of viral copies(<138 copies/mL). A negative result must be combined with clinical observations, patient history, and epidemiological information. The expected result is Negative.  Fact Sheet for Patients:   BloggerCourse.com  Fact Sheet for Healthcare Providers:  SeriousBroker.it  This test is no t yet approved or cleared by the [place]  FDA and  has been authorized for detection and/or diagnosis of SARS-CoV-2 by FDA under an Emergency Use Authorization (EUA). This EUA will remain  in effect (meaning this test can be used) for the duration of the COVID-19 declaration under Section 564(b)(1) of the Act, 21 U.S.C.section 360bbb-3(b)(1), unless the authorization is terminated  or revoked sooner.       Influenza A by PCR NEGATIVE NEGATIVE Final   Influenza B by PCR NEGATIVE NEGATIVE Final    Comment: (NOTE) The Xpert Xpress SARS-CoV-2/FLU/RSV plus assay is intended as an aid in the diagnosis of influenza from Nasopharyngeal swab specimens and should not be used as a sole basis for treatment. Nasal washings and aspirates are unacceptable for Xpert Xpress SARS-CoV-2/FLU/RSV testing.  Fact Sheet for Patients: BloggerCourse.com  Fact Sheet for Healthcare Providers: SeriousBroker.it  This test is not yet approved or cleared by the [place]  FDA and has been authorized for detection and/or diagnosis of SARS-CoV-2 by FDA under an Emergency Use Authorization (EUA). This EUA will remain in effect (meaning this test can be used) for the duration of the COVID-19 declaration under Section 564(b)(1) of the Act, 21 U.S.C. section 360bbb-3(b)(1), unless the authorization is terminated or revoked.     Resp Syncytial Virus by PCR NEGATIVE NEGATIVE Final    Comment: (NOTE) Fact Sheet for Patients: BloggerCourse.com  Fact Sheet for Healthcare Providers: SeriousBroker.it  This test is not yet approved or cleared by the [place]  FDA and has been authorized for detection and/or diagnosis of SARS-CoV-2 by FDA under an Emergency Use  Authorization (EUA). This EUA will remain in effect (meaning this test can be used) for the duration of the COVID-19 declaration under Section 564(b)(1) of the Act, 21 U.S.C. section 360bbb-3(b)(1), unless the authorization is terminated or revoked.  Performed at [facility], [address] [place], [place] [address]     Coagulation Studies: No results for input(s): "LABPROT", "INR" in the last 72 hours.  Urinalysis: No results for input(s): "COLORURINE", "LABSPEC", "PHURINE", "GLUCOSEU", "HGBUR", "BILIRUBINUR", "KETONESUR", "PROTEINUR", "UROBILINOGEN", "NITRITE", "LEUKOCYTESUR" in the last 72 hours.  Invalid input(s): "APPERANCEUR"    Imaging: No results found.   Medications:     amLODipine   5 mg Oral BID   apixaban   5 mg Oral BID   budesonide -glycopyrrolate -formoterol   2 puff Inhalation BID   carvedilol   12.5 mg Oral BID   cyanocobalamin   1,000 mcg Oral Daily   darbepoetin (ARANESP) injection - NON-DIALYSIS  100 mcg Subcutaneous Q Sat-1800   ferrous sulfate   325 mg Oral QPM   insulin  aspart  0-9 Units Subcutaneous TID WC  insulin  glargine-yfgn  15 Units Subcutaneous Daily   pantoprazole   40 mg Oral Daily   rosuvastatin   10 mg Oral QHS   sodium bicarbonate   1,300 mg Oral BID   sodium chloride  flush  3 mL Intravenous Q12H   sodium zirconium cyclosilicate   10 g Oral Once   acetaminophen , HYDROcodone -acetaminophen , ipratropium-albuterol , melatonin, ondansetron  (ZOFRAN ) IV, sodium chloride  flush  Assessment/ Plan:  [name] is a 68 y.o.  male with past medical conditions including CHF, COPD, CAD, stroke, diabetes, CKDIV and hypertension, who was admitted to [facility] on 11/04/2023 for COPD exacerbation (HCC) [J44.1] AKI (acute kidney injury) (HCC) [N17.9] Acute on chronic HFrEF (heart failure with reduced ejection fraction) (HCC) [I50.23] Acute on chronic congestive heart failure, unspecified heart failure type (HCC) [I50.9] CHF exacerbation  (HCC) [I50.9]   Acute kidney injury on chronic kidney disease IV. AKI likely secondary to hemodynamic instability and agressive diuresis. Baseline creatinine appears to be 3.25 with GFR 20 on 10/17/23.  History of solitary kidney.  Patient initially treated with IV Furosemide  but transitioned to oral. Renal function improving.  Diuretics held.  No acute indication for dialysis, Patient follows with [facility] nephrology as outpatient.  Last seen in December 2024. 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  Lab Results  Component Value Date   CREATININE 3.95 (H) 11/12/2023   CREATININE 4.26 (H) 11/11/2023   CREATININE 4.53 (H) 11/10/2023    Intake/Output Summary (Last 24 hours) at 11/12/2023 1120 Last data filed at 11/12/2023 0900 Gross per 24 hour  Intake 1320 ml  Output 2500 ml  Net -1180 ml   2. Chronic systolic heart failure, ECHO from 11/24 has EF 40%. Treated with IV furosemide , then oral. Remain held due to kidney injury.   3. Anemia of chronic kidney disease Lab Results  Component Value Date   HGB 8.0 (L) 11/12/2023  Patient received blood transfusion yesterday. Hgb 8.0 today.    4. Diabetes mellitus type II with chronic kidney disease/renal manifestations: insulin  dependent. Home regimen includes Lantus  and humalog . Most recent hemoglobin A1c is 6.7 on 09/18/23.   Glucose elevated at times.    LOS: 6 Shantelle Breeze 6/1/202511:20 AM   Patient was seen and examined with Anola King, NP.  Plan of care was formulated for the problems addressed and discussed with NP.  I agree with the note as documented except as noted below.

## 2023-11-13 DIAGNOSIS — E1122 Type 2 diabetes mellitus with diabetic chronic kidney disease: Secondary | ICD-10-CM | POA: Diagnosis not present

## 2023-11-13 DIAGNOSIS — J438 Other emphysema: Secondary | ICD-10-CM | POA: Diagnosis not present

## 2023-11-13 DIAGNOSIS — N179 Acute kidney failure, unspecified: Secondary | ICD-10-CM | POA: Diagnosis not present

## 2023-11-13 DIAGNOSIS — I5023 Acute on chronic systolic (congestive) heart failure: Secondary | ICD-10-CM | POA: Diagnosis not present

## 2023-11-13 LAB — CBC WITH DIFFERENTIAL/PLATELET
Abs Immature Granulocytes: 0.22 10*3/uL — ABNORMAL HIGH (ref 0.00–0.07)
Basophils Absolute: 0 10*3/uL (ref 0.0–0.1)
Basophils Relative: 0 %
Eosinophils Absolute: 0 10*3/uL (ref 0.0–0.5)
Eosinophils Relative: 0 %
HCT: 24.3 % — ABNORMAL LOW (ref 39.0–52.0)
Hemoglobin: 8 g/dL — ABNORMAL LOW (ref 13.0–17.0)
Immature Granulocytes: 3 %
Lymphocytes Relative: 20 %
Lymphs Abs: 1.7 10*3/uL (ref 0.7–4.0)
MCH: 29.3 pg (ref 26.0–34.0)
MCHC: 32.9 g/dL (ref 30.0–36.0)
MCV: 89 fL (ref 80.0–100.0)
Monocytes Absolute: 0.7 10*3/uL (ref 0.1–1.0)
Monocytes Relative: 8 %
Neutro Abs: 5.6 10*3/uL (ref 1.7–7.7)
Neutrophils Relative %: 69 %
Platelets: 252 10*3/uL (ref 150–400)
RBC: 2.73 MIL/uL — ABNORMAL LOW (ref 4.22–5.81)
RDW: 13.5 % (ref 11.5–15.5)
WBC: 8.2 10*3/uL (ref 4.0–10.5)
nRBC: 0 % (ref 0.0–0.2)

## 2023-11-13 LAB — MAGNESIUM: Magnesium: 2.2 mg/dL (ref 1.7–2.4)

## 2023-11-13 LAB — COMPREHENSIVE METABOLIC PANEL WITH GFR
ALT: 25 U/L (ref 0–44)
AST: 18 U/L (ref 15–41)
Albumin: 2.2 g/dL — ABNORMAL LOW (ref 3.5–5.0)
Alkaline Phosphatase: 51 U/L (ref 38–126)
Anion gap: 8 (ref 5–15)
BUN: 121 mg/dL — ABNORMAL HIGH (ref 8–23)
CO2: 22 mmol/L (ref 22–32)
Calcium: 8.3 mg/dL — ABNORMAL LOW (ref 8.9–10.3)
Chloride: 104 mmol/L (ref 98–111)
Creatinine, Ser: 3.71 mg/dL — ABNORMAL HIGH (ref 0.61–1.24)
GFR, Estimated: 17 mL/min — ABNORMAL LOW (ref >60)
Glucose, Bld: 313 mg/dL — ABNORMAL HIGH (ref 70–99)
Potassium: 5 mmol/L (ref 3.5–5.1)
Sodium: 134 mmol/L — ABNORMAL LOW (ref 135–145)
Total Bilirubin: 0.5 mg/dL (ref 0.0–1.2)
Total Protein: 5.6 g/dL — ABNORMAL LOW (ref 6.5–8.1)

## 2023-11-13 LAB — GLUCOSE, CAPILLARY
Glucose-Capillary: 222 mg/dL — ABNORMAL HIGH (ref 70–99)
Glucose-Capillary: 244 mg/dL — ABNORMAL HIGH (ref 70–99)

## 2023-11-13 LAB — PHOSPHORUS: Phosphorus: 3.6 mg/dL (ref 2.5–4.6)

## 2023-11-13 MED ORDER — METOLAZONE 2.5 MG PO TABS: ORAL_TABLET | ORAL | 0 refills | Status: DC

## 2023-11-13 MED ORDER — AMLODIPINE BESYLATE 5 MG PO TABS: 5.0000 mg | ORAL_TABLET | Freq: Every day | ORAL | 0 refills | Status: DC

## 2023-11-13 MED ORDER — PANTOPRAZOLE SODIUM 40 MG PO TBEC: 40.0000 mg | DELAYED_RELEASE_TABLET | Freq: Every day | ORAL | Status: AC

## 2023-11-13 NOTE — Progress Notes (Signed)
 Occupational Therapy Treatment Patient Details Name: Larry Callahan MRN: [id number] DOB: [birth year] Today's Date: 11/13/2023   History of present illness Pt is a 68 y/o male admitted secondary to SOB and found to have acute on chronic CHF, AKI and COPD exacerbation. PMH including but not limited to HFrEF with last EF of 40% secondary to nonischemic cardiomyopathy, atrial flutter s/p ablation on Eliquis , CKD stage IV, hypertension, type 2 diabetes, COPD, OSA, hyperlipidemia, nonobstructive CAD, CVA.   OT comments  Pt seen for OT tx this date, continues to demo poor insight to personal hygiene as he is received seated in recliner with stained/soiled gown, exposed from waist down. Dons new gown with MIN A in standing, completes functional mobility 400+ ft on unit with RW. Pt overall dismissive of education for AD proximity as he tends to walk with RW out in front and corrects posture intermittently. OT will continue to follow for functional gains, discharge recommendation appropriate.       If plan is discharge home, recommend the following:  Supervision due to cognitive status;Assistance with cooking/housework;A little help with bathing/dressing/bathroom;A little help with walking and/or transfers;Direct supervision/assist for financial management;Direct supervision/assist for medications management   Equipment Recommendations  None recommended by OT       Precautions / Restrictions Precautions Precautions: Fall Recall of Precautions/Restrictions: Impaired Restrictions Weight Bearing Restrictions Per Provider Order: No       Mobility Bed Mobility               General bed mobility comments: NT, pt up in recliner start and end of session    Transfers Overall transfer level: Needs assistance Equipment used: Rolling walker (2 wheels) Transfers: Sit to/from Stand Sit to Stand: Supervision                 Balance Overall balance assessment: Needs  assistance Sitting-balance support: Feet supported Sitting balance-Leahy Scale: Good     Standing balance support: During functional activity, No upper extremity supported (able to don gown without UE support with supervision) Standing balance-Leahy Scale: Good                             ADL either performed or assessed with clinical judgement   ADL Overall ADL's : Needs assistance/impaired                 Upper Body Dressing : Minimal assistance Upper Body Dressing Details (indicate cue type and reason): standing, max vcs to change gown (gown donned with LB exposed from the waist down, gown covered in stains). pt irritated with OT attempts to facilitate bathing or hygiene.                   General ADL Comments: Poor hygiene and limited awareness     Communication Communication Communication: Impaired Factors Affecting Communication: Hearing impaired   Cognition Arousal: Alert Behavior During Therapy: WFL for tasks assessed/performed Cognition: No apparent impairments             OT - Cognition Comments: poor awareness of personal hygiene, suspect some level of cognitive deficit with IADL med and financial management. pt often dismissive of cuing + education for AD use                 Following commands: Intact Following commands impaired: Only follows one step commands consistently      Cueing   Cueing Techniques: Verbal cues  General Comments SpO2 98% on RA    Pertinent Vitals/ Pain       Pain Assessment Pain Assessment: No/denies pain   Frequency  Min 2X/week        Progress Toward Goals  OT Goals(current goals can now be found in the care plan section)     Acute Rehab OT Goals OT Goal Formulation: With patient Time For Goal Achievement: 11/19/23 Potential to Achieve Goals: Good  Plan      Co-evaluation                 AM-PAC OT "6 Clicks" Daily Activity     Outcome Measure   Help from  another person eating meals?: None Help from another person taking care of personal grooming?: A Little Help from another person toileting, which includes using toliet, bedpan, or urinal?: A Little Help from another person bathing (including washing, rinsing, drying)?: A Little Help from another person to put on and taking off regular upper body clothing?: None Help from another person to put on and taking off regular lower body clothing?: None 6 Click Score: 21    End of Session Equipment Utilized During Treatment: Rolling walker (2 wheels);Gait belt  OT Visit Diagnosis: Other abnormalities of gait and mobility (R26.89);Unsteadiness on feet (R26.81)   Activity Tolerance Patient tolerated treatment well   Patient Left in chair;with chair alarm set;with call bell/phone within reach   Nurse Communication Mobility status (requesting water; RN notified due to fluid restriction on door/in chart)        Time: 1111-1130 OT Time Calculation (min): 19 min  Charges: OT General Charges $OT Visit: 1 Visit OT Treatments $Self Care/Home Management : 8-22 mins  Pegeen Stiger L. Carynn Felling, OTR/L  11/13/23, 1:03 PM

## 2023-11-13 NOTE — TOC Progression Note (Signed)
 Transition of Care [facility]) - Progression Note    Patient Details  Name: Larry Callahan MRN: [id number] Date of Birth: 1955-07-08  Transition of Care [facility] CM/SW Contact  Elsie Halo, RN Phone Number: 11/13/2023, 1:51 PM  Clinical Narrative:     Patient is medically clear for discharge to home with [facility] PT/OT and aide. Referral to resume [facility] PT/OT and aide call to [facility], and Verdis Glade accepted.  No other TOC needs identified.     Barriers to Discharge: Barriers Resolved  Expected Discharge Plan and Services         Expected Discharge Date: 11/13/23                         HH Arranged: PT, OT, Nurse's Aide HH Agency: Other - See comment [facility] [place]) Date HH Agency Contacted: 11/13/23 Time HH Agency Contacted: [phone] Representative spoke with at [facility] Agency: Melvern Stack   Social Determinants of Health (SDOH) Interventions SDOH Screenings   Food Insecurity: No Food Insecurity (11/04/2023)  Housing: Low Risk  (11/04/2023)  Transportation Needs: No Transportation Needs (11/04/2023)  Utilities: Not At Risk (11/04/2023)  Alcohol Screen: Low Risk  (03/29/2022)  Depression (PHQ2-9): Low Risk  (09/13/2022)  Financial Resource Strain: Low Risk  (05/12/2021)  Social Connections: Unknown (11/04/2023)  Tobacco Use: High Risk (11/06/2023)    Readmission Risk Interventions    12/01/2021   12:25 PM 08/07/2021    2:36 PM 06/28/2021    2:26 PM  Readmission Risk Prevention Plan  Transportation Screening Complete Complete Complete  Medication Review Oceanographer) Complete Complete Complete  PCP or Specialist appointment within 3-5 days of discharge Complete Complete Complete  HRI or Home Care Consult Complete Complete Complete  SW Recovery Care/Counseling Consult Complete Complete Complete  Palliative Care Screening Complete Complete Not Applicable  Skilled Nursing Facility Complete Complete Not Applicable

## 2023-11-13 NOTE — Progress Notes (Addendum)
 [facility]  ROUNDING NOTE   Subjective:   Patient sitting up in bed Alert Continues to request discharge Partially completed breakfast tray at bedside  Creatinine 3.71  Objective:  Vital signs in last 24 hours:  Temp:  [97.6 F (36.4 C)-97.9 F (36.6 C)] 97.6 F (36.4 C) (06/02 0737) Pulse Rate:  [57-60] 60 (06/02 0737) Resp:  [16-18] 16 (06/02 0737) BP: (140-147)/(65-72) 147/70 (06/02 0737) SpO2:  [99 %-100 %] 100 % (06/02 0737) Weight:  [91 kg] 91 kg (06/02 0351)  Weight change: 2.4 kg Filed Weights   11/10/23 0500 11/12/23 0454 11/13/23 0351  Weight: 87.7 kg 88.6 kg 91 kg    Intake/Output: I/O last 3 completed shifts: In: 360 [P.O.:360] Out: 1600 [Urine:1600]   Intake/Output this shift:  Total I/O In: 240 [P.O.:240] Out: 800 [Urine:800]  Physical Exam: General: NAD  Head: Normocephalic, atraumatic. Moist oral mucosal membranes  Eyes: Anicteric  Lungs:  Coarse, normal effort  Heart: Regular rate and rhythm  Abdomen:  Soft, nontender  Extremities:  No peripheral edema.  Neurologic: Awake, alert, conversant  Skin: Warm,dry, no rash  Access: None    Basic Metabolic Panel: Recent Labs  Lab 11/07/23 0516 11/08/23 0233 11/09/23 0414 11/10/23 0352 11/11/23 0459 11/12/23 0443 11/13/23 0422  NA 136   < > 136 135 137 138 134*  K 3.9   < > 4.6 4.6 4.8 5.2* 5.0  CL 105   < > 103 104 105 106 104  CO2 22   < > 22 22 23 22 22   GLUCOSE 157*   < > 291* 274* 290* 216* 313*  BUN 93*   < > 108* 113* 117* 120* 121*  CREATININE 4.64*   < > 4.86* 4.53* 4.26* 3.95* 3.71*  CALCIUM  7.4*   < > 7.9* 8.1* 8.3* 8.5* 8.3*  MG 2.0   < > 2.4 2.5* 2.4 2.3 2.2  PHOS 4.5  --   --   --  3.5 3.2 3.6   < > = values in this interval not displayed.    Liver Function Tests: Recent Labs  Lab 11/11/23 0459 11/12/23 0443 11/13/23 0422  AST 20 24 18   ALT 18 27 25   ALKPHOS 50 52 51  BILITOT 0.8 0.5 0.5  PROT 5.8* 5.9* 5.6*  ALBUMIN  2.3* 2.3* 2.2*   No  results for input(s): "LIPASE", "AMYLASE" in the last 168 hours. No results for input(s): "AMMONIA" in the last 168 hours.  CBC: Recent Labs  Lab 11/07/23 0858 11/11/23 0459 11/12/23 0443 11/13/23 0422  WBC 5.1 7.2 9.2 8.2  NEUTROABS  --  5.1 6.4 5.6  HGB 7.6* 6.8* 8.0* 8.0*  HCT 22.8* 21.4* 24.9* 24.3*  MCV 88.0 91.1 91.9 89.0  PLT 216 236 254 252    Cardiac Enzymes: No results for input(s): "CKTOTAL", "CKMB", "CKMBINDEX", "TROPONINI" in the last 168 hours.  BNP: Invalid input(s): "POCBNP"  CBG: Recent Labs  Lab 11/12/23 0828 11/12/23 1126 11/12/23 1644 11/13/23 0739 11/13/23 1106  GLUCAP 155* 240* 315* 222* 244*    Microbiology: Results for orders placed or performed during the hospital encounter of 11/04/23  Resp panel by RT-PCR (RSV, Flu A&B, Covid) Anterior Nasal Swab     Status: None   Collection Time: 11/04/23  3:45 PM   Specimen: Anterior Nasal Swab  Result Value Ref Range Status   SARS Coronavirus 2 by RT PCR NEGATIVE NEGATIVE Final    Comment: (NOTE) SARS-CoV-2 target nucleic acids are NOT DETECTED.  The SARS-CoV-2  RNA is generally detectable in upper respiratory specimens during the acute phase of infection. The lowest concentration of SARS-CoV-2 viral copies this assay can detect is 138 copies/mL. A negative result does not preclude SARS-Cov-2 infection and should not be used as the sole basis for treatment or other patient management decisions. A negative result may occur with  improper specimen collection/handling, submission of specimen other than nasopharyngeal swab, presence of viral mutation(s) within the areas targeted by this assay, and inadequate number of viral copies(<138 copies/mL). A negative result must be combined with clinical observations, patient history, and epidemiological information. The expected result is Negative.  Fact Sheet for Patients:  BloggerCourse.com  Fact Sheet for Healthcare Providers:   SeriousBroker.it  This test is no t yet approved or cleared by the [place]  FDA and  has been authorized for detection and/or diagnosis of SARS-CoV-2 by FDA under an Emergency Use Authorization (EUA). This EUA will remain  in effect (meaning this test can be used) for the duration of the COVID-19 declaration under Section 564(b)(1) of the Act, 21 U.S.C.section 360bbb-3(b)(1), unless the authorization is terminated  or revoked sooner.       Influenza A by PCR NEGATIVE NEGATIVE Final   Influenza B by PCR NEGATIVE NEGATIVE Final    Comment: (NOTE) The Xpert Xpress SARS-CoV-2/FLU/RSV plus assay is intended as an aid in the diagnosis of influenza from Nasopharyngeal swab specimens and should not be used as a sole basis for treatment. Nasal washings and aspirates are unacceptable for Xpert Xpress SARS-CoV-2/FLU/RSV testing.  Fact Sheet for Patients: BloggerCourse.com  Fact Sheet for Healthcare Providers: SeriousBroker.it  This test is not yet approved or cleared by the [place]  FDA and has been authorized for detection and/or diagnosis of SARS-CoV-2 by FDA under an Emergency Use Authorization (EUA). This EUA will remain in effect (meaning this test can be used) for the duration of the COVID-19 declaration under Section 564(b)(1) of the Act, 21 U.S.C. section 360bbb-3(b)(1), unless the authorization is terminated or revoked.     Resp Syncytial Virus by PCR NEGATIVE NEGATIVE Final    Comment: (NOTE) Fact Sheet for Patients: BloggerCourse.com  Fact Sheet for Healthcare Providers: SeriousBroker.it  This test is not yet approved or cleared by the [place]  FDA and has been authorized for detection and/or diagnosis of SARS-CoV-2 by FDA under an Emergency Use Authorization (EUA). This EUA will remain in effect (meaning this test can be used) for  the duration of the COVID-19 declaration under Section 564(b)(1) of the Act, 21 U.S.C. section 360bbb-3(b)(1), unless the authorization is terminated or revoked.  Performed at [facility], [address] [place], [place] [address]     Coagulation Studies: No results for input(s): "LABPROT", "INR" in the last 72 hours.  Urinalysis: No results for input(s): "COLORURINE", "LABSPEC", "PHURINE", "GLUCOSEU", "HGBUR", "BILIRUBINUR", "KETONESUR", "PROTEINUR", "UROBILINOGEN", "NITRITE", "LEUKOCYTESUR" in the last 72 hours.  Invalid input(s): "APPERANCEUR"    Imaging: No results found.   Medications:     amLODipine   5 mg Oral BID   apixaban   5 mg Oral BID   budesonide -glycopyrrolate -formoterol   2 puff Inhalation BID   carvedilol   12.5 mg Oral BID   cyanocobalamin   1,000 mcg Oral Daily   darbepoetin (ARANESP) injection - NON-DIALYSIS  100 mcg Subcutaneous Q Sat-1800   ferrous sulfate   325 mg Oral QPM   insulin  aspart  0-9 Units Subcutaneous TID WC   insulin  glargine-yfgn  15 Units Subcutaneous Daily   pantoprazole   40 mg Oral Daily  rosuvastatin   10 mg Oral QHS   sodium bicarbonate   1,300 mg Oral BID   sodium chloride  flush  3 mL Intravenous Q12H   acetaminophen , HYDROcodone -acetaminophen , ipratropium-albuterol , melatonin, ondansetron  (ZOFRAN ) IV, sodium chloride  flush  Assessment/ Plan:  Larry Callahan is a 68 y.o.  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 AKI likely secondary to hemodynamic instability and agressive diuresis.  Baseline creatinine appears to be 3.25 with GFR 20 on 10/17/23.  History of solitary kidney.  Patient initially treated with IV Furosemide  but transitioned to oral. Patient follows with [facility] nephrology as outpatient.  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 Creatinine continues to improve slowly. No acute indication for dialysis, Will need to follow up with us  at discharge.    Lab Results  Component Value Date   CREATININE 3.71 (H) 11/13/2023   CREATININE 3.95 (H) 11/12/2023   CREATININE 4.26 (H) 11/11/2023    Intake/Output Summary (Last 24 hours) at 11/13/2023 1157 Last data filed at 11/13/2023 1108 Gross per 24 hour  Intake 240 ml  Output 1500 ml  Net -1260 ml   2. Chronic systolic heart failure, ECHO from 11/24 has EF 40%. Treated with IV furosemide , then oral. Recommend restarting oral daily Furosemide  40mg  and adding weekly Metolazone 2.5mg .   3. Anemia of chronic kidney disease Lab Results  Component Value Date   HGB 8.0 (L) 11/13/2023  Patient received blood transfusion yesterday. Hgb 8.0 today.    4. Diabetes mellitus type II with chronic kidney disease/renal manifestations: insulin  dependent. Home regimen includes Lantus  and humalog . Most recent hemoglobin A1c is 6.7 on 09/18/23.   Glucose elevated at times. Primary team within optimal range.   LOS: 7 Millie Forde 6/2/202511:57 AM

## 2023-11-13 NOTE — Inpatient Diabetes Management (Signed)
 Inpatient Diabetes Program Recommendations  AACE/ADA: New Consensus Statement on Inpatient Glycemic Control   Target Ranges:  Prepandial:   less than 140 mg/dL      Peak postprandial:   less than 180 mg/dL (1-2 hours)      Critically ill patients:  140 - 180 mg/dL     Latest Reference Range & Units 11/12/23 08:28 11/12/23 11:26 11/12/23 16:44 11/13/23 07:39  Glucose-Capillary 70 - 99 mg/dL 846 (H) 962 (H) 952 (H) 222 (H)    Latest Reference Range & Units 11/11/23 07:22 11/11/23 11:35 11/11/23 16:56 11/11/23 16:57 11/11/23 21:27  Glucose-Capillary 70 - 99 mg/dL 841 (H) 324 (H) 401 (H) 350 (H) 377 (H)   Review of Glycemic Control  Diabetes history: DM2 Outpatient Diabetes medications: Toujeo  20 units QAM, Toujeo  16 units at bedtime, Humalog  0-10 units TID with meals Current orders for Inpatient glycemic control: Semglee  15 units daily, Novolog  0-9 units TID with meals  Inpatient Diabetes Program Recommendations:    Insulin : Noted no longer ordered steroids; last received Prednisone  20 mg on 11/12/23. Please consider ordering Novolog  3 units TID with meals for meal coverage if patient eats at least 50% of meals.  Thanks, Beacher Limerick, RN, MSN, CDCES Diabetes Coordinator Inpatient Diabetes Program [phone] (Team Pager from 8am to 5pm)

## 2023-11-13 NOTE — Discharge Summary (Signed)
 Physician Discharge Summary   Patient: Larry Callahan MRN: [id number] DOB: Sep 06, 1955  Admit date:     11/04/2023  Discharge date: 11/13/23  Discharge Physician: Roise Cleaver   PCP: Laurence Pons, NP   Recommendations at discharge:    Follow up with Nephrology as scheduled See PCP for continued chronic medication management  Discharge Diagnoses: Principal Problem:   Acute on chronic HFrEF (heart failure with reduced ejection fraction) (HCC) Active Problems:   Acute kidney injury superimposed on chronic kidney disease (HCC)   COPD (chronic obstructive pulmonary disease) (HCC)   Essential hypertension   Type 2 diabetes mellitus (HCC)   Atrial flutter (HCC)   Metabolic acidosis, normal anion gap (NAG)   CHF exacerbation (HCC)  Resolved Problems:   * No resolved hospital problems. *  Hospital Course: Larry Callahan is a 68 year old male with heart failure with reduced EF 40% secondary to nonischemic cardiomyopathy, a flutter status post ablation on Eliquis , CKD stage IV, hypertension, type 2 diabetes, COPD, OSA, HLD, nonobstructive CAD, CVA, who presents to the ED with shortness of breath.  He was admitted for CHF exacerbation.  Patient also had recent admissions for COPD exacerbations with pneumonia.  Patient was started on IV and had excellent urine output.  He continued to have dyspnea and was started on treatment for COPD exacerbation.  Gradually respiratory status resolved.  Stay was complicated by AKI superimposed on CKD stage IV.  On further review it appears that patient does not follow consistently with any nephrologist though he does have history of solitary kidney.  Patient's acute worsening during this admission was secondary to IV diuresis which she necessitated due to volume overload.  Once diuresis was stopped kidney function gradually resolved towards baseline.  Nephrology was consulted and has arranged outpatient follow-up.  Patient will likely need  dialysis within the next year but presently maintains good urine output.    Acute on chronic heart failure with reduced EF - November 2024: EF 40%, suspected secondary to a flutter -BNP elevated above baseline - Had pulmonary edema on arrival, no lower extremity edema - Was initially on IV Lasix , transitioned to p.o. on 5/27, discontinued entirely 5/29 - At discharge continue 40 mg Lasix  daily, once weekly metolazone per nephrology  Back pain - Chronic.  Ongoing.  Likely MSK etiology - Continue with muscle relaxers as needed, heating pad, out of bed frequently - As needed Tylenol  and Norco - Unlikely cardiac etiology given normal EKG, troponin within normal limits, and chronicity.   AKI superimposed on CKD stage IV - Baseline creatinine near 3.5, acute worsening on arrival to 5.2 - Patient is a poor historian, reports he has right kidney only due to "masses" on the other.  He has not seen a nephrologist consistently - Creatinine downtrending now, nearing baseline - Nephrology is consulted and has concerns of imminent dialysis though nonurgently - Output remains strong - Follow-up with nephrology in the clinic.  Referral placed.   Headache, resolved - As needed Tylenol .  Status post Fioricet  x 1   COPD, in exacerbation - Remains on room air - s/p 5-day prednisone  course - Continue home dose nebulizers   Leukopenia - WBC 1.3 on arrival - WBC is resolved now - Afebrile   Metabolic acidosis, normal anion gap - On bicarb per renal   Anemia of chronic disease - Hgb has been gradually downtrending.  Baseline is just above 7 - 1 unit PRBC on 5/31 - Hemoglobin rose expectedly after transfusion - No active  signs of bleeding   A flutter - Status post ablation [facility] in December 2024 - EKG: Junctional versus sinus rhythm, consistent with prior - Continue home meds   Type 2 diabetes - Resume home meds at discharge   Hypertension - Continue current regimen      Consultants:  Nephrology Disposition: Home health Diet recommendation:  Discharge Diet Orders (From admission, onward)     Start     Ordered   11/13/23 0000  Diet general        11/13/23 1324           Renal diet DISCHARGE MEDICATION: Allergies as of 11/13/2023       Reactions   Penicillins Rash, Dermatitis, Hives, Other (See Comments)   Other reaction(s): HIVES   Codeine  Nausea And Vomiting        Medication List     STOP taking these medications    apixaban  5 MG Tabs tablet Commonly known as: Eliquis    polyethylene glycol powder 17 GM/SCOOP powder Commonly known as: GLYCOLAX /MIRALAX        TAKE these medications    Accu-Chek Guide test strip Generic drug: glucose blood Use to check blood sugars 4 times a day E11.65   Accu-Chek Softclix Lancets lancets Use to check blood sugars 4 times a day   E11.65   acetaminophen  325 MG tablet Commonly known as: TYLENOL  Take 2 tablets (650 mg total) by mouth every 6 (six) hours as needed for mild pain or moderate pain (or Fever >/= 101).   albuterol  108 (90 Base) MCG/ACT inhaler Commonly known as: VENTOLIN  HFA Inhale 1-2 puffs into the lungs every 4 (four) hours as needed for wheezing or shortness of breath.   amLODipine  5 MG tablet Commonly known as: NORVASC  Take 1 tablet (5 mg total) by mouth 2 (two) times daily. Skip the dose if systolic BP less than 140 mmHg What changed: Another medication with the same name was added. Make sure you understand how and when to take each.   amLODipine  5 MG tablet Commonly known as: NORVASC  Take 1 tablet (5 mg total) by mouth daily. What changed: You were already taking a medication with the same name, and this prescription was added. Make sure you understand how and when to take each.   Banophen 25 MG capsule Generic drug: diphenhydrAMINE  Take 1 capsule (25 mg total) by mouth every 8 (eight) hours as needed for itching.   Breztri  Aerosphere 160-9-4.8 MCG/ACT Aero inhaler Generic drug:  budesonide -glycopyrrolate -formoterol  Inhale 2 puffs into the lungs 2 (two) times daily.   carvedilol  12.5 MG tablet Commonly known as: COREG  Take 1 tablet (12.5 mg total) by mouth 2 (two) times daily.   cyanocobalamin  1000 MCG tablet Take 1 tablet (1,000 mcg total) by mouth daily.   ferrous sulfate  325 (65 FE) MG tablet Take 325 mg by mouth every evening.   furosemide  40 MG tablet Commonly known as: LASIX  Take 1 tablet (40 mg total) by mouth daily.   hydrALAZINE  100 MG tablet Commonly known as: APRESOLINE  Take 1 tablet (100 mg total) by mouth 3 (three) times daily as needed (SBP >160).   Insulin  lispro 100 UNIT/ML Commonly known as: HUMALOG  Junior KwikPen Inject 0-10 Units into the skin 3 (three) times daily.   ipratropium-albuterol  0.5-2.5 (3) MG/3ML Soln Commonly known as: DUONEB Take 3 mLs by nebulization every 6 (six) hours as needed.   methocarbamol  750 MG tablet Commonly known as: ROBAXIN  Take 1 tablet (750 mg total) by mouth every 6 (six) hours  as needed for muscle spasms.   metolazone 2.5 MG tablet Commonly known as: ZAROXOLYN Take once weekly on Tuesdays   Novofine Pen Needle 32G X 6 MM Misc Generic drug: Insulin  Pen Needle Use as directed with toujeo  pen   pantoprazole  40 MG tablet Commonly known as: PROTONIX  Take 1 tablet (40 mg total) by mouth daily. What changed: when to take this   PERIGUARD EX Apply 1 Application topically in the morning, at noon, and at bedtime.   potassium chloride  SA 20 MEQ tablet Commonly known as: KLOR-CON  M Take 1 tablet (20 mEq total) by mouth daily.   rosuvastatin  10 MG tablet Commonly known as: CRESTOR  Take 1 tablet (10 mg total) by mouth at bedtime.   sodium bicarbonate  650 MG tablet Take 2 tablets (1,300 mg total) by mouth 3 (three) times daily for 14 days, THEN 1 tablet (650 mg total) 3 (three) times daily. Start taking on: Oct 17, 2023   Toujeo  Max SoloStar 300 UNIT/ML Solostar Pen Generic drug: insulin  glargine  (2 Unit Dial ) Inject 10 Units into the skin at bedtime.   True Metrix Air Glucose Meter Devi 1 Device by Does not apply route 2 (two) times daily.        Follow-up Information     Laurence Pons, NP Follow up.   Specialty: Nurse Practitioner Why: Hospital follow up Contact information: [address] [place] [address] [phone]                Discharge Exam: Filed Weights   11/10/23 0500 11/12/23 0454 11/13/23 0351  Weight: 87.7 kg 88.6 kg 91 kg   Constitutional:  Normal appearance. Non toxic-appearing.  HENT: Head Normocephalic and atraumatic.  Mucous membranes are moist.  Eyes:  Extraocular intact. Conjunctivae normal. Pupils are equal, round, and reactive to light.  Cardiovascular: Rate and Rhythm: Normal rate and regular rhythm.  Pulmonary: Non labored, symmetric rise of chest wall.  Musculoskeletal:  Normal range of motion.  Skin: warm and dry. not jaundiced.  Neurological: No focal deficit present. alert. Oriented. Psychiatric: Mood and Affect congruent.    Condition at discharge: stable  The results of significant diagnostics from this hospitalization (including imaging, microbiology, ancillary and laboratory) are listed below for reference.   Imaging Studies: DG Chest Port 1 View Result Date: 11/07/2023 CLINICAL DATA:  [id number] Chest pain [id number] EXAM: PORTABLE CHEST - 1 VIEW COMPARISON:  11/04/2023 FINDINGS: Right lateral pleural thickening and blunting of the right lateral costophrenic angle. Left retrocardiac atelectasis/consolidation. Heart size and mediastinal contours are within normal limits. Aortic Atherosclerosis (ICD10-170.0). Progressive mild thoracic dextroscoliosis. IMPRESSION: 1. Left retrocardiac atelectasis/consolidation. 2. Right lateral pleural thickening and blunting of the right lateral costophrenic angle. Electronically Signed   By: Nicoletta Barrier M.D.   On: 11/07/2023 14:06   ECHOCARDIOGRAM COMPLETE Result Date: 11/05/2023     ECHOCARDIOGRAM REPORT   Patient Name:   Larry Callahan [facility] Date of Exam: 11/05/2023 Medical Rec #:  [id number]              Height:       71.0 in Accession #:    [id number]             Weight:       191.1 lb Date of Birth:  03-29-1956              BSA:          2.068 m Patient Age:    46 years  BP:           131/63 mmHg Patient Gender: M                      HR:           74 bpm. Exam Location:  [name] Procedure: 2D Echo, Cardiac Doppler and Color Doppler (Both Spectral and Color            Flow Doppler were utilized during procedure). Indications:     CHF I50.21  History:         Patient has prior history of Echocardiogram examinations, most                  recent 08/08/2021.  Sonographer:     Clenton Czech RDCS, FASE Referring Phys:  [id number] Avi Body Diagnosing Phys: Antonette Batters MD IMPRESSIONS  1. Left ventricular ejection fraction, by estimation, is 50 to 55%. The left ventricle has low normal function. The left ventricle has no regional wall motion abnormalities. There is mild left ventricular hypertrophy. Left ventricular diastolic parameters are consistent with Grade I diastolic dysfunction (impaired relaxation).  2. Right ventricular systolic function is normal. The right ventricular size is normal.  3. The mitral valve is normal in structure. Trivial mitral valve regurgitation.  4. The aortic valve is grossly normal. Aortic valve regurgitation is not visualized. FINDINGS  Left Ventricle: Left ventricular ejection fraction, by estimation, is 50 to 55%. The left ventricle has low normal function. The left ventricle has no regional wall motion abnormalities. Strain was performed and the global longitudinal strain is indeterminate. Global longitudinal strain performed but not reported based on interpreter judgement due to suboptimal tracking. The left ventricular internal cavity size was normal in size. There is mild left ventricular hypertrophy. Left ventricular diastolic parameters  are consistent with Grade I diastolic dysfunction (impaired relaxation). Right Ventricle: The right ventricular size is normal. No increase in right ventricular wall thickness. Right ventricular systolic function is normal. Left Atrium: Left atrial size was normal in size. Right Atrium: Right atrial size was normal in size. Pericardium: There is no evidence of pericardial effusion. Mitral Valve: The mitral valve is normal in structure. Trivial mitral valve regurgitation. Tricuspid Valve: The tricuspid valve is normal in structure. Tricuspid valve regurgitation is trivial. Aortic Valve: The aortic valve is grossly normal. Aortic valve regurgitation is not visualized. Aortic regurgitation PHT measures 106 msec. Aortic valve peak gradient measures 13.0 mmHg. Pulmonic Valve: The pulmonic valve was normal in structure. Pulmonic valve regurgitation is not visualized. Aorta: The ascending aorta was not well visualized. IAS/Shunts: No atrial level shunt detected by color flow Doppler. Additional Comments: 3D was performed not requiring image post processing on an independent workstation and was indeterminate.  LEFT VENTRICLE PLAX 2D LVIDd:         4.70 cm      Diastology LVIDs:         3.40 cm      LV e' medial:    8.27 cm/s LV PW:         1.30 cm      LV E/e' medial:  9.4 LV IVS:        1.30 cm      LV e' lateral:   9.68 cm/s LVOT diam:     2.20 cm      LV E/e' lateral: 8.1 LV SV:         94 LV SV Index:   45 LVOT  Area:     3.80 cm  LV Volumes (MOD) LV vol d, MOD A4C: 116.0 ml LV vol s, MOD A4C: 46.6 ml LV SV MOD A4C:     116.0 ml RIGHT VENTRICLE RV Basal diam:  3.40 cm RV S prime:     10.40 cm/s TAPSE (M-mode): 2.4 cm LEFT ATRIUM             Index        RIGHT ATRIUM           Index LA diam:        4.00 cm 1.93 cm/m   RA Area:     17.50 cm LA Vol (A2C):   75.3 ml 36.41 ml/m  RA Volume:   44.40 ml  21.47 ml/m LA Vol (A4C):   53.1 ml 25.67 ml/m LA Biplane Vol: 63.2 ml 30.56 ml/m  AORTIC VALVE                 PULMONIC  VALVE AV Area (Vmax): 2.20 cm     PV Vmax:       1.20 m/s AV Vmax:        180.00 cm/s  PV Peak grad:  5.8 mmHg AV Peak Grad:   13.0 mmHg LVOT Vmax:      104.00 cm/s LVOT Vmean:     67.400 cm/s LVOT VTI:       0.247 m AI PHT:         106 msec  AORTA Ao Root diam: 3.70 cm MITRAL VALVE               TRICUSPID VALVE MV Area (PHT): 3.58 cm    TR Peak grad:   27.7 mmHg MV Decel Time: 212 msec    TR Vmax:        263.00 cm/s MV E velocity: 78.10 cm/s MV A velocity: 92.80 cm/s  SHUNTS MV E/A ratio:  0.84        Systemic VTI:  0.25 m                            Systemic Diam: 2.20 cm Antonette Batters MD Electronically signed by Antonette Batters MD Signature Date/Time: 11/05/2023/9:41:53 PM    Final    DG Chest Portable 1 View Result Date: 11/04/2023 CLINICAL DATA:  Shortness of breath, cough EXAM: PORTABLE CHEST 1 VIEW COMPARISON:  10/14/2023 FINDINGS: Stable cardiomegaly with similar vascular congestion. No definite acute edema pattern or CHF. No focal pneumonia, collapse or consolidation. No large effusion or pneumothorax. Aorta atherosclerotic. Degenerative changes of the spine. Overall stable exam. IMPRESSION: Cardiomegaly with vascular congestion. No acute process by portable radiography. Electronically Signed   By: Melven Stable.  Shick M.D.   On: 11/04/2023 15:13    Microbiology: Results for orders placed or performed during the hospital encounter of 11/04/23  Resp panel by RT-PCR (RSV, Flu A&B, Covid) Anterior Nasal Swab     Status: None   Collection Time: 11/04/23  3:45 PM   Specimen: Anterior Nasal Swab  Result Value Ref Range Status   SARS Coronavirus 2 by RT PCR NEGATIVE NEGATIVE Final    Comment: (NOTE) SARS-CoV-2 target nucleic acids are NOT DETECTED.  The SARS-CoV-2 RNA is generally detectable in upper respiratory specimens during the acute phase of infection. The lowest concentration of SARS-CoV-2 viral copies this assay can detect is 138 copies/mL. A negative result does not preclude  SARS-Cov-2 infection and should not be used  as the sole basis for treatment or other patient management decisions. A negative result may occur with  improper specimen collection/handling, submission of specimen other than nasopharyngeal swab, presence of viral mutation(s) within the areas targeted by this assay, and inadequate number of viral copies(<138 copies/mL). A negative result must be combined with clinical observations, patient history, and epidemiological information. The expected result is Negative.  Fact Sheet for Patients:  BloggerCourse.com  Fact Sheet for Healthcare Providers:  SeriousBroker.it  This test is no t yet approved or cleared by the [place]  FDA and  has been authorized for detection and/or diagnosis of SARS-CoV-2 by FDA under an Emergency Use Authorization (EUA). This EUA will remain  in effect (meaning this test can be used) for the duration of the COVID-19 declaration under Section 564(b)(1) of the Act, 21 U.S.C.section 360bbb-3(b)(1), unless the authorization is terminated  or revoked sooner.       Influenza A by PCR NEGATIVE NEGATIVE Final   Influenza B by PCR NEGATIVE NEGATIVE Final    Comment: (NOTE) The Xpert Xpress SARS-CoV-2/FLU/RSV plus assay is intended as an aid in the diagnosis of influenza from Nasopharyngeal swab specimens and should not be used as a sole basis for treatment. Nasal washings and aspirates are unacceptable for Xpert Xpress SARS-CoV-2/FLU/RSV testing.  Fact Sheet for Patients: BloggerCourse.com  Fact Sheet for Healthcare Providers: SeriousBroker.it  This test is not yet approved or cleared by the [place]  FDA and has been authorized for detection and/or diagnosis of SARS-CoV-2 by FDA under an Emergency Use Authorization (EUA). This EUA will remain in effect (meaning this test can be used) for the duration of  the COVID-19 declaration under Section 564(b)(1) of the Act, 21 U.S.C. section 360bbb-3(b)(1), unless the authorization is terminated or revoked.     Resp Syncytial Virus by PCR NEGATIVE NEGATIVE Final    Comment: (NOTE) Fact Sheet for Patients: BloggerCourse.com  Fact Sheet for Healthcare Providers: SeriousBroker.it  This test is not yet approved or cleared by the [place]  FDA and has been authorized for detection and/or diagnosis of SARS-CoV-2 by FDA under an Emergency Use Authorization (EUA). This EUA will remain in effect (meaning this test can be used) for the duration of the COVID-19 declaration under Section 564(b)(1) of the Act, 21 U.S.C. section 360bbb-3(b)(1), unless the authorization is terminated or revoked.  Performed at [facility], [address] [place], [place] [address]     Labs: CBC: Recent Labs  Lab 11/07/23 [phone] 11/11/23 0459 11/12/23 0443 11/13/23 0422  WBC 5.1 7.2 9.2 8.2  NEUTROABS  --  5.1 6.4 5.6  HGB 7.6* 6.8* 8.0* 8.0*  HCT 22.8* 21.4* 24.9* 24.3*  MCV 88.0 91.1 91.9 89.0  PLT 216 236 254 252   Basic Metabolic Panel: Recent Labs  Lab 11/07/23 0516 11/08/23 0233 11/09/23 0414 11/10/23 0352 11/11/23 0459 11/12/23 0443 11/13/23 0422  NA 136   < > 136 135 137 138 134*  K 3.9   < > 4.6 4.6 4.8 5.2* 5.0  CL 105   < > 103 104 105 106 104  CO2 22   < > 22 22 23 22 22   GLUCOSE 157*   < > 291* 274* 290* 216* 313*  BUN 93*   < > 108* 113* 117* 120* 121*  CREATININE 4.64*   < > 4.86* 4.53* 4.26* 3.95* 3.71*  CALCIUM  7.4*   < > 7.9* 8.1* 8.3* 8.5* 8.3*  MG 2.0   < > 2.4 2.5* 2.4 2.3 2.2  PHOS 4.5  --   --   --  3.5 3.2 3.6   < > = values in this interval not displayed.   Liver Function Tests: Recent Labs  Lab 11/11/23 0459 11/12/23 0443 11/13/23 0422  AST 20 24 18   ALT 18 27 25   ALKPHOS 50 52 51  BILITOT 0.8 0.5 0.5  PROT 5.8* 5.9* 5.6*  ALBUMIN  2.3* 2.3* 2.2*    CBG: Recent Labs  Lab 11/12/23 0828 11/12/23 1126 11/12/23 1644 11/13/23 0739 11/13/23 1106  GLUCAP 155* 240* 315* 222* 244*    Discharge time spent: 32 minutes.  Signed: Aleea Hendry, DO [facility] 11/13/2023

## 2023-11-15 ENCOUNTER — Other Ambulatory Visit: Payer: Self-pay

## 2023-11-15 ENCOUNTER — Encounter: Payer: Self-pay | Admitting: Emergency Medicine

## 2023-11-15 ENCOUNTER — Emergency Department
Admission: EM | Admit: 2023-11-15 | Discharge: 2023-11-19 | Disposition: A | Discharge status: Home / Self Care | Attending: Emergency Medicine | Admitting: Emergency Medicine

## 2023-11-15 DIAGNOSIS — I129 Hypertensive chronic kidney disease with stage 1 through stage 4 chronic kidney disease, or unspecified chronic kidney disease: Secondary | ICD-10-CM | POA: Insufficient documentation

## 2023-11-15 DIAGNOSIS — K625 Hemorrhage of anus and rectum: Secondary | ICD-10-CM | POA: Diagnosis present

## 2023-11-15 DIAGNOSIS — I509 Heart failure, unspecified: Secondary | ICD-10-CM | POA: Diagnosis not present

## 2023-11-15 DIAGNOSIS — J449 Chronic obstructive pulmonary disease, unspecified: Secondary | ICD-10-CM | POA: Diagnosis not present

## 2023-11-15 DIAGNOSIS — E1122 Type 2 diabetes mellitus with diabetic chronic kidney disease: Secondary | ICD-10-CM | POA: Insufficient documentation

## 2023-11-15 DIAGNOSIS — K921 Melena: Secondary | ICD-10-CM | POA: Diagnosis not present

## 2023-11-15 DIAGNOSIS — Z7901 Long term (current) use of anticoagulants: Secondary | ICD-10-CM | POA: Insufficient documentation

## 2023-11-15 DIAGNOSIS — I130 Hypertensive heart and chronic kidney disease with heart failure and stage 1 through stage 4 chronic kidney disease, or unspecified chronic kidney disease: Secondary | ICD-10-CM | POA: Insufficient documentation

## 2023-11-15 DIAGNOSIS — N189 Chronic kidney disease, unspecified: Secondary | ICD-10-CM | POA: Diagnosis not present

## 2023-11-15 LAB — TYPE AND SCREEN
ABO/RH(D): A POS
Antibody Screen: NEGATIVE

## 2023-11-15 LAB — HEMOGLOBIN AND HEMATOCRIT, BLOOD
HCT: 28.6 % — ABNORMAL LOW (ref 39.0–52.0)
Hemoglobin: 9.2 g/dL — ABNORMAL LOW (ref 13.0–17.0)

## 2023-11-15 LAB — CBC
HCT: 31.5 % — ABNORMAL LOW (ref 39.0–52.0)
HCT: 28.7 % — ABNORMAL LOW (ref 39.0–52.0)
Hemoglobin: 10 g/dL — ABNORMAL LOW (ref 13.0–17.0)
Hemoglobin: 9 g/dL — ABNORMAL LOW (ref 13.0–17.0)
MCH: 29.3 pg (ref 26.0–34.0)
MCH: 29.2 pg (ref 26.0–34.0)
MCHC: 31.7 g/dL (ref 30.0–36.0)
MCHC: 31.4 g/dL (ref 30.0–36.0)
MCV: 92.4 fL (ref 80.0–100.0)
MCV: 93.2 fL (ref 80.0–100.0)
Platelets: 269 10*3/uL (ref 150–400)
Platelets: 210 10*3/uL (ref 150–400)
RBC: 3.41 MIL/uL — ABNORMAL LOW (ref 4.22–5.81)
RBC: 3.08 MIL/uL — ABNORMAL LOW (ref 4.22–5.81)
RDW: 14.3 % (ref 11.5–15.5)
RDW: 14.3 % (ref 11.5–15.5)
WBC: 10.8 10*3/uL — ABNORMAL HIGH (ref 4.0–10.5)
WBC: 8.2 10*3/uL (ref 4.0–10.5)
nRBC: 0 % (ref 0.0–0.2)
nRBC: 0 % (ref 0.0–0.2)

## 2023-11-15 LAB — COMPREHENSIVE METABOLIC PANEL WITH GFR
ALT: 21 U/L (ref 0–44)
AST: 17 U/L (ref 15–41)
Albumin: 2.8 g/dL — ABNORMAL LOW (ref 3.5–5.0)
Alkaline Phosphatase: 67 U/L (ref 38–126)
Anion gap: 12 (ref 5–15)
BUN: 105 mg/dL — ABNORMAL HIGH (ref 8–23)
CO2: 18 mmol/L — ABNORMAL LOW (ref 22–32)
Calcium: 8.3 mg/dL — ABNORMAL LOW (ref 8.9–10.3)
Chloride: 109 mmol/L (ref 98–111)
Creatinine, Ser: 3.86 mg/dL — ABNORMAL HIGH (ref 0.61–1.24)
GFR, Estimated: 16 mL/min — ABNORMAL LOW (ref >60)
Glucose, Bld: 132 mg/dL — ABNORMAL HIGH (ref 70–99)
Potassium: 4.8 mmol/L (ref 3.5–5.1)
Sodium: 139 mmol/L (ref 135–145)
Total Bilirubin: 0.9 mg/dL (ref 0.0–1.2)
Total Protein: 6.6 g/dL (ref 6.5–8.1)

## 2023-11-15 LAB — PROTIME-INR
INR: 1.8 — ABNORMAL HIGH (ref 0.8–1.2)
Prothrombin Time: 20.6 s — ABNORMAL HIGH (ref 11.4–15.2)

## 2023-11-15 MED ORDER — METOLAZONE 2.5 MG PO TABS: 2.5000 mg | ORAL_TABLET | ORAL | Status: DC

## 2023-11-15 MED ORDER — INSULIN ASPART 100 UNIT/ML IJ SOLN
0.0000 [IU] | Freq: Three times a day (TID) | INTRAMUSCULAR | Status: DC
  Administered 2023-11-16: 3 [IU] via SUBCUTANEOUS
  Administered 2023-11-16: 5 [IU] via SUBCUTANEOUS
  Administered 2023-11-16 – 2023-11-17 (×2): 2 [IU] via SUBCUTANEOUS
  Administered 2023-11-17: 3 [IU] via SUBCUTANEOUS
  Administered 2023-11-18 (×3): 2 [IU] via SUBCUTANEOUS
  Administered 2023-11-19: 3 [IU] via SUBCUTANEOUS
  Filled 2023-11-15 (×9): qty 1

## 2023-11-15 MED ORDER — ROSUVASTATIN CALCIUM 10 MG PO TABS
10.0000 mg | ORAL_TABLET | Freq: Every day | ORAL | Status: DC
  Administered 2023-11-16 – 2023-11-19 (×4): 10 mg via ORAL
  Filled 2023-11-15 (×4): qty 1

## 2023-11-15 MED ORDER — FUROSEMIDE 40 MG PO TABS
40.0000 mg | ORAL_TABLET | Freq: Every day | ORAL | Status: DC
  Administered 2023-11-15 – 2023-11-19 (×5): 40 mg via ORAL
  Filled 2023-11-15 (×5): qty 1

## 2023-11-15 MED ORDER — INSULIN ASPART 100 UNIT/ML IJ SOLN
4.0000 [IU] | Freq: Three times a day (TID) | INTRAMUSCULAR | Status: DC
  Administered 2023-11-16 – 2023-11-19 (×9): 4 [IU] via SUBCUTANEOUS
  Filled 2023-11-15 (×10): qty 1

## 2023-11-15 MED ORDER — PANTOPRAZOLE SODIUM 40 MG PO TBEC
40.0000 mg | DELAYED_RELEASE_TABLET | Freq: Every day | ORAL | Status: DC
  Administered 2023-11-15 – 2023-11-19 (×5): 40 mg via ORAL
  Filled 2023-11-15 (×5): qty 1

## 2023-11-15 MED ORDER — AMLODIPINE BESYLATE 5 MG PO TABS
5.0000 mg | ORAL_TABLET | Freq: Every day | ORAL | Status: DC
  Filled 2023-11-15: qty 1

## 2023-11-15 MED ORDER — AMLODIPINE BESYLATE 5 MG PO TABS
5.0000 mg | ORAL_TABLET | Freq: Two times a day (BID) | ORAL | Status: DC
  Administered 2023-11-15 – 2023-11-19 (×8): 5 mg via ORAL
  Filled 2023-11-15 (×8): qty 1

## 2023-11-15 MED ORDER — CARVEDILOL 6.25 MG PO TABS
12.5000 mg | ORAL_TABLET | Freq: Two times a day (BID) | ORAL | Status: DC
  Administered 2023-11-16 – 2023-11-19 (×7): 12.5 mg via ORAL
  Filled 2023-11-15 (×7): qty 2

## 2023-11-15 NOTE — ED Triage Notes (Signed)
 First Nurse Note: Patient to ED via [facility] from home for blood in stool- dark in color. No abd pain.   135/79 66 HR 99% RA

## 2023-11-15 NOTE — ED Provider Notes (Signed)
 [facility] Provider Note    Event Date/Time   First MD Initiated Contact with Patient 11/15/23 1255     (approximate)   History   Rectal Bleeding   HPI  Larry Callahan is a 68 y.o. male with a history of HFrEF, COPD, CKD, hypertension, type 2 diabetes, atrial flutter on Eliquis  who presents with apparent blood in his stool.  The patient noted several episodes today in which he had light red blood in the stool.  He denies any dark tarry stool.  He has no other abnormal bleeding or bruising.  He is on Eliquis .  He was just discharged from the hospital 2 days ago.  He also reports that he cannot safely go home; he is living with a family birth who also has medical problems and is unable to take care of him adequately.  He would like SNF placement.  I reviewed the past medical records including the hospitalist discharge summary from 6/2.  The patient was treated for a CHF exacerbation and AKI superimposed on CKD.   Physical Exam   Triage Vital Signs: ED Triage Vitals [11/15/23 1215]  Encounter Vitals Group     BP 115/60     Systolic BP Percentile      Diastolic BP Percentile      Pulse Rate (!) 131     Resp 17     Temp 98.3 F (36.8 C)     Temp Source Oral     SpO2 94 %     Weight      Height      Head Circumference      Peak Flow      Pain Score 0     Pain Loc      Pain Education      Exclude from Growth Chart     Most recent vital signs: Vitals:   11/15/23 1215 11/15/23 1330  BP: 115/60 121/60  Pulse: (!) 131 (!) 58  Resp: 17 17  Temp: 98.3 F (36.8 C)   SpO2: 94% 100%     General: Awake, no distress.  CV:  Good peripheral perfusion.  Resp:  Normal effort.  Abd:  No distention.  Other:  Normal external rectal exam with no hemorrhoids.  Brown stool, faintly guaiac positive on DRE.   ED Results / Procedures / Treatments   Labs (all labs ordered are listed, but only abnormal results are displayed) Labs Reviewed   COMPREHENSIVE METABOLIC PANEL WITH GFR - Abnormal; Notable for the following components:      Result Value   CO2 18 (*)    Glucose, Bld 132 (*)    BUN 105 (*)    Creatinine, Ser 3.86 (*)    Calcium  8.3 (*)    Albumin  2.8 (*)    GFR, Estimated 16 (*)    All other components within normal limits  CBC - Abnormal; Notable for the following components:   WBC 10.8 (*)    RBC 3.41 (*)    Hemoglobin 10.0 (*)    HCT 31.5 (*)    All other components within normal limits  PROTIME-INR - Abnormal; Notable for the following components:   Prothrombin Time 20.6 (*)    INR 1.8 (*)    All other components within normal limits  CBC  POC OCCULT BLOOD, ED  TYPE AND SCREEN     EKG    RADIOLOGY    PROCEDURES:  Critical Care performed: No  Procedures   MEDICATIONS  ORDERED IN ED: Medications - No data to display   IMPRESSION / MDM / ASSESSMENT AND PLAN / ED COURSE  I reviewed the triage vital signs and the nursing notes.  68 year old male with PMH as noted above including atrial flutter on Eliquis  presents with blood in his stool, several episodes occurring just today.  The patient denies any prior history of this.  On exam the vital signs are normal and the patient is overall well-appearing.  He has no visible hemorrhoids.  The stool is brown with no melena and is very faintly guaiac positive.  Differential diagnosis includes, but is not limited to, internal hemorrhoids, diverticulosis, other lower GI source.  I doubt upper GI bleed.  Vital signs are stable.  Initial CBC shows a hemoglobin of 10.0 which is an improvement for the patient.  CMP shows no acute findings.  We will obtain a repeat for our CBC.  If this is stable, anticipate that the patient will be medically cleared although he feels he is not able to go back to his current living situation and is interested in SNF placement.  I have ordered PT and TOC consults.  Patient's presentation is most consistent with acute  presentation with potential threat to life or bodily function.  The patient is on the cardiac monitor to evaluate for evidence of arrhythmia and/or significant heart rate changes.  ----------------------------------------- 3:01 PM on 11/15/2023 -----------------------------------------  Repeat CBC is pending.  If there is no significant change in the patient is medically cleared, the patient will be awaiting TOC evaluation.  He will be signed out to the oncoming ED physician.   FINAL CLINICAL IMPRESSION(S) / ED DIAGNOSES   Final diagnoses:  Blood in stool     Rx / DC Orders   ED Discharge Orders     None        Note:  This document was prepared using Dragon voice recognition software and may include unintentional dictation errors.    Lind Repine, MD 11/15/23 1501

## 2023-11-15 NOTE — ED Notes (Signed)
 Resumed care from whitney rn.  Pt alert.  Iv in place.  Pt reports he needs placement, unable to care for self at home.  Lives with nephew who can't help much either.

## 2023-11-15 NOTE — ED Notes (Signed)
 Pt up to bathroom with assistance using walker and PT staff.  Pt had watery loose stool  no blood seen in stool  pt cleaned and placed in new scrub bottoms.

## 2023-11-15 NOTE — ED Notes (Signed)
 PT in with pt.

## 2023-11-15 NOTE — Evaluation (Signed)
 Physical Therapy Evaluation Patient Details Name: Larry Callahan MRN: [id number] DOB: 04-Mar-1956 Today's Date: 11/15/2023  History of Present Illness  Larry Callahan is a 68 y.o. male with a history of HFrEF, COPD, CKD, hypertension, type 2 diabetes, atrial flutter on Eliquis  who presents with apparent blood in his stool.  The patient noted several episodes today in which he had light red blood in the stool.  He denies any dark tarry stool. MD assessment includes blood in stool.  Clinical Impression  Pt presented as mildly impulsive, anxious, and agitated upon PT arriving but with cuing and encouragement put forth fair effort during the session.  Pt required mod +2 assist rolling and from side lying to EOB. Pt presented with posterior lean while sitting EOB, but posterior lean improved with VCs and anterior weight shifting activites. Pt stated that he needed to use the bathroom once sitting EOB and ambulated to/from [facility] with RW and +2 mod assist. Pt required near total assist to come to standing from a standard height toilet along with heavy cuing for sequencing.  Overall pt presented with significant decline in functional strength/mobility compared to his baseline and will benefit from continued PT services upon discharge to safely address deficits listed in patient problem list for decreased caregiver assistance and eventual return to PLOF.           If plan is discharge home, recommend the following: Two people to help with walking and/or transfers;Two people to help with bathing/dressing/bathroom;Assistance with cooking/housework;Direct supervision/assist for medications management;Direct supervision/assist for financial management;Assist for transportation;Help with stairs or ramp for entrance;Supervision due to cognitive status   Can travel by private vehicle   No    Equipment Recommendations Other (comment) (TBD at next venue of care)  Recommendations for Other Services        Functional Status Assessment Patient has had a recent decline in their functional status and demonstrates the ability to make significant improvements in function in a reasonable and predictable amount of time.     Precautions / Restrictions Precautions Precautions: Fall Recall of Precautions/Restrictions: Impaired Restrictions Weight Bearing Restrictions Per Provider Order: No      Mobility  Bed Mobility Overal bed mobility: Needs Assistance Bed Mobility: Rolling Rolling: Mod assist, +2 for physical assistance   Supine to sit: Mod assist, +2 for physical assistance Sit to supine: Min assist   General bed mobility comments: physical assistance required for BLE and trunk control    Transfers Overall transfer level: Needs assistance Equipment used: Rolling walker (2 wheels) Transfers: Sit to/from Stand Sit to Stand: +2 physical assistance, Max assist, Min assist           General transfer comment: min assist from elevated EOB and +2 max assist from standard height toilet    Ambulation/Gait Ambulation/Gait assistance: Min assist, +2 safety/equipment Gait Distance (Feet): 8 Feet x 2 Assistive device: Rolling walker (2 wheels) Gait Pattern/deviations: Step-through pattern, Shuffle, Trunk flexed, Decreased step length - right, Decreased step length - left Gait velocity: decreased     General Gait Details: Pt heavily dependent on shifting weight through RW; min assist to guide RW for general stability  Stairs            Wheelchair Mobility     Tilt Bed    Modified Rankin (Stroke Patients Only)       Balance Overall balance assessment: History of Falls, Needs assistance   Sitting balance-Leahy Scale: Poor Sitting balance - Comments: Sitting balance improved throughout session  Postural control: Posterior lean Standing balance support: Reliant on assistive device for balance, Bilateral upper extremity supported, During functional activity Standing  balance-Leahy Scale: Poor                               Pertinent Vitals/Pain Pain Assessment Pain Assessment: No/denies pain Pain Score: 0-No pain    Home Living Family/patient expects to be discharged to:: Private residence Living Arrangements: Other relatives;Other (Comment) (stated that he lives with nephew) Available Help at Discharge: Family;Available PRN/intermittently Type of Home: House Home Access: Level entry       Home Layout: One level Home Equipment: Rollator (4 wheels);BSC/3in1;Wheelchair - Forensic psychologist (2 wheels) Additional Comments: above history taken from combination of pt report and hx from recent ED admission due to pt being poor historian/hard to direct    Prior Function Prior Level of Function : Independent/Modified Independent             Mobility Comments: ambulates with rollator or RW, 2 recent falls due to LE weakness ADLs Comments: independent     Extremity/Trunk Assessment   Upper Extremity Assessment Upper Extremity Assessment: Generalized weakness    Lower Extremity Assessment Lower Extremity Assessment: Generalized weakness       Communication   Communication Communication: Impaired    Cognition Arousal: Alert Behavior During Therapy: Agitated, Anxious, Impulsive   PT - Cognitive impairments: No family/caregiver present to determine baseline                         Following commands: Impaired Following commands impaired: Follows one step commands inconsistently, Follows multi-step commands inconsistently     Cueing Cueing Techniques: Verbal cues, Tactile cues     General Comments      Exercises Other Exercises Other Exercises: anterior weight shifting activities in sitting to address posterior lean   Assessment/Plan    PT Assessment Patient needs continued PT services  PT Problem List Decreased balance;Decreased knowledge of use of DME;Decreased safety awareness;Decreased  strength;Decreased activity tolerance;Decreased mobility       PT Treatment Interventions DME instruction;Gait training;Functional mobility training;Therapeutic activities;Therapeutic exercise;Balance training;Patient/family education    PT Goals (Current goals can be found in the Care Plan section)  Acute Rehab PT Goals Patient Stated Goal: Return strength to baseline level PT Goal Formulation: With patient Time For Goal Achievement: 11/28/23 Potential to Achieve Goals: Fair    Frequency Min 2X/week     Co-evaluation               AM-PAC PT "6 Clicks" Mobility  Outcome Measure Help needed turning from your back to your side while in a flat bed without using bedrails?: A Lot Help needed moving from lying on your back to sitting on the side of a flat bed without using bedrails?: A Lot Help needed moving to and from a bed to a chair (including a wheelchair)?: A Lot Help needed standing up from a chair using your arms (e.g., wheelchair or bedside chair)?: A Lot Help needed to walk in hospital room?: A Lot Help needed climbing 3-5 steps with a railing? : Total 6 Click Score: 11    End of Session Equipment Utilized During Treatment: Gait belt Activity Tolerance: Patient tolerated treatment well Patient left: in bed;Other (comment);with call bell/phone within reach (bilateral rails up as found in ED) Nurse Communication: Mobility status PT Visit Diagnosis: Repeated falls (R29.6);History of falling (Z91.81);Unsteadiness on feet (  R26.81);Muscle weakness (generalized) (M62.81);Difficulty in walking, not elsewhere classified (R26.2)    Time: 8657-8469 PT Time Calculation (min) (ACUTE ONLY): 36 min   Charges:   PT Evaluation $PT Eval Moderate Complexity: 1 Mod PT Treatments $Therapeutic Activity: 8-22 mins PT General Charges $$ ACUTE PT VISIT: 1 Visit       D. Madalyn Scarce PT, DPT 11/15/23, 5:13 PM

## 2023-11-15 NOTE — ED Provider Notes (Signed)
.-----------------------------------------   3:38 PM on 11/15/2023 -----------------------------------------  Blood pressure 134/69, pulse 66, temperature 98.3 F (36.8 C), temperature source Oral, resp. rate (!) 22, SpO2 100%.  Assuming care from Dr. Demetrios Finders.  In short, Larry Callahan is a 68 y.o. male with a chief complaint of Rectal Bleeding .  Refer to the original H&P for additional details.  The current plan of care is to follow-up repeat CBC, will need to stay for [facility] and SNF placement.  Patient's repeat hemoglobin 10->9, he had another watery stool here that was nonbloody.  Will repeat 1 more hemoglobin to see if it stabilizes.  Repeat hemoglobin is stable.  No additional bloody bowel movements in the emergency department.  Patient is medically cleared for TOC and placement.  Will put in his home meds as per his discharge paperwork from his last admission, at that time he was told to continue his Eliquis  so I will not reorder that.  Clinical Course as of 11/15/23 1915  Wed Nov 15, 2023  1906 Hemoglobin and hematocrit, blood(!) H&H is stable. [TT]    Clinical Course User Index [TT] Shane Darling, MD      Shane Darling, MD 11/15/23 [phone]

## 2023-11-16 LAB — CBG MONITORING, ED
Glucose-Capillary: 132 mg/dL — ABNORMAL HIGH (ref 70–99)
Glucose-Capillary: 241 mg/dL — ABNORMAL HIGH (ref 70–99)
Glucose-Capillary: 170 mg/dL — ABNORMAL HIGH (ref 70–99)
Glucose-Capillary: 71 mg/dL (ref 70–99)

## 2023-11-16 NOTE — TOC Initial Note (Addendum)
 Transition of Care [facility]) - Initial/Assessment Note    Patient Details  Name: Larry Callahan MRN: [id number] Date of Birth: 16-Sep-1955  [facility] CM/SW Contact:    Arminda Landmark, RN Phone Number: 11/16/2023, 12:01 PM  Clinical Narrative:                 Met with pt in his ED room. He lives at home with nephew. He was just Dc'd from the hospital 2 days ago with [facility]. I't not clear if they started going yet. He was at [facility] from 05/23/23-09/07/23 and said he doesn't want to go to that SNF again. He admits having difficulties with ADL's and gives himself sponge baths. Nephew drives him to appointments, etc. He's able to get and afford his medications. He denies having any open wounds. 1500: Pt has picked out [facility], [facility], [facility], and [facility]. Discussed him going into an ALF after SNF due to frequent hospitalizations and being at a SNF for 107 days during the last 6 months and he verbalized understanding. Sent SNF referrals to the above mentioned SNF's. TOC to continue to follow.   Expected Discharge Plan: Skilled Nursing Facility Barriers to Discharge: Continued Medical Work up   Patient Goals and CMS Choice     Choice offered to / list presented to : Patient      Expected Discharge Plan and Services   Discharge Planning Services: CM Consult   Living arrangements for the past 2 months: Single Family Home                   DME Agency:  (He has a walker and w/c)                  Prior Living Arrangements/Services Living arrangements for the past 2 months: Single Family Home Lives with:: Relatives (nephew)              Current home services: DME    Activities of Daily Living      Permission Sought/Granted                  Emotional Assessment              Admission diagnosis:  Blood in Stool Patient Active Problem List   Diagnosis Date Noted   CHF  exacerbation (HCC) 11/06/2023   Acute on chronic HFrEF (heart failure with reduced ejection fraction) (HCC) 11/04/2023   Acute kidney injury superimposed on chronic kidney disease (HCC) 11/04/2023   COPD exacerbation (HCC) 10/14/2023   Fall at home, initial encounter 10/14/2023   COPD with acute exacerbation (HCC) 10/10/2023   Metabolic acidosis, normal anion gap (NAG) 10/10/2023   Positive D dimer 09/23/2022   Hemoptysis 09/23/2022   Left arm numbness 09/13/2022   Acute upper GI bleed 07/31/2022   Chest pain 07/31/2022   Hematoma of left flank 07/31/2022   Acute on chronic anemia 07/31/2022   Renal mass 07/31/2022   Upper GI bleed    Dizziness and giddiness 01/04/2022   Umbilical hernia 01/04/2022   Acute on chronic systolic CHF (congestive heart failure) (HCC) 08/05/2021   Acquired thrombophilia (HCC) 06/28/2021   Anemia of chronic kidney failure 06/28/2021   PAD (peripheral artery disease) (HCC)    Cellulitis of right arm 03/02/2021   Atrial flutter (HCC) 01/07/2021   Demand ischemia (HCC)    Influenza A 10/30/2020   HLD (hyperlipidemia) 10/06/2020  Gastroesophageal reflux disease without esophagitis 04/19/2020   Elevated troponin I level 01/29/2020   Hematemesis 01/09/2020   Hospital discharge follow-up 11/26/2019   Peripheral neuropathy 08/11/2019   Obesity (BMI 30-39.9) 08/08/2019   Acute right hip pain 06/17/2019   Inability to ambulate due to hip 06/17/2019   CKD (chronic kidney disease) stage 4, GFR 15-29 ml/min (HCC)    Hypervolemia    Full code status 05/20/2019   Pleuritic chest pain    Acute on chronic heart failure with preserved ejection fraction (HFpEF) (HCC)    Chronic systolic CHF (congestive heart failure) (HCC) 03/01/2019   Iron  deficiency anemia 12/31/2018   Atopic dermatitis 11/24/2018   Screening for colon cancer 11/06/2017   Type 2 diabetes mellitus (HCC) 11/06/2017   Hidradenitis 06/09/2017   Atherosclerotic heart disease of native coronary  artery without angina pectoris 06/09/2017   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   Obstructive sleep apnea, adult 06/09/2017   Cigarette nicotine  dependence with nicotine -induced disorder 06/09/2017   Diabetic polyneuropathy associated with type 2 diabetes mellitus (HCC) 06/09/2017   Allergic rhinitis due to pollen 06/09/2017   Mixed hyperlipidemia 06/09/2017   COPD (chronic obstructive pulmonary disease) (HCC) 06/09/2017   Wheezing 06/09/2017   Dysuria 06/09/2017   Essential hypertension 06/09/2017   Personal history of other malignant neoplasm of kidney 06/09/2017   Pain in right hip 06/09/2017   Secondary hyperparathyroidism, not elsewhere classified (HCC) 06/09/2017   Tinea corporis 06/09/2017   PCP:  Laurence Pons, NP Pharmacy:   [facility] - [place], [place] - [address] [place] [address] Phone: [phone] Fax: [phone]     Social Drivers of Health (SDOH) Social History: SDOH Screenings   Food Insecurity: No Food Insecurity (11/04/2023)  Housing: Low Risk  (11/04/2023)  Transportation Needs: No Transportation Needs (11/04/2023)  Utilities: Not At Risk (11/04/2023)  Alcohol Screen: Low Risk  (03/29/2022)  Depression (PHQ2-9): Low Risk  (09/13/2022)  Financial Resource Strain: Low Risk  (05/12/2021)  Social Connections: Unknown (11/04/2023)  Tobacco Use: High Risk (11/15/2023)   SDOH Interventions:     Readmission Risk Interventions    12/01/2021   12:25 PM 08/07/2021    2:36 PM 06/28/2021    2:26 PM  Readmission Risk Prevention Plan  Transportation Screening Complete Complete Complete  Medication Review Oceanographer) Complete Complete Complete  PCP or Specialist appointment within 3-5 days of discharge Complete Complete Complete  HRI or Home Care Consult Complete Complete Complete  SW Recovery Care/Counseling Consult Complete Complete Complete  Palliative Care Screening Complete Complete Not  Applicable  Skilled Nursing Facility Complete Complete Not Applicable

## 2023-11-16 NOTE — [place] FL2 (Addendum)
 [place]  MEDICAID FL2 LEVEL OF CARE FORM     IDENTIFICATION  Patient Name: Larry Callahan Birthdate: 1955-06-26 Sex: male Admission Date (Current Location): 11/15/2023  [facility] and [place] Number:  Chiropodist and Address:  [facility], [address] [place], [place] [address]      Provider Number: [id number]  Attending Physician Name and Address:  No att. providers found  Relative Name and Phone Number:  Clotilda Danish)  [phone]    Current Level of Care: Hospital Recommended Level of Care: Skilled Nursing Facility Prior Approval Number:    Date Approved/Denied:   PASRR Number:    Discharge Plan: SNF    Current Diagnoses: Patient Active Problem List   Diagnosis Date Noted   CHF exacerbation (HCC) 11/06/2023   Acute on chronic HFrEF (heart failure with reduced ejection fraction) (HCC) 11/04/2023   Acute kidney injury superimposed on chronic kidney disease (HCC) 11/04/2023   COPD exacerbation (HCC) 10/14/2023   Fall at home, initial encounter 10/14/2023   COPD with acute exacerbation (HCC) 10/10/2023   Metabolic acidosis, normal anion gap (NAG) 10/10/2023   Positive D dimer 09/23/2022   Hemoptysis 09/23/2022   Left arm numbness 09/13/2022   Acute upper GI bleed 07/31/2022   Chest pain 07/31/2022   Hematoma of left flank 07/31/2022   Acute on chronic anemia 07/31/2022   Renal mass 07/31/2022   Upper GI bleed    Dizziness and giddiness 01/04/2022   Umbilical hernia 01/04/2022   Acute on chronic systolic CHF (congestive heart failure) (HCC) 08/05/2021   Acquired thrombophilia (HCC) 06/28/2021   Anemia of chronic kidney failure 06/28/2021   PAD (peripheral artery disease) (HCC)    Cellulitis of right arm 03/02/2021   Atrial flutter (HCC) 01/07/2021   Demand ischemia (HCC)    Influenza A 10/30/2020   HLD (hyperlipidemia) 10/06/2020   Gastroesophageal reflux disease without esophagitis 04/19/2020   Elevated  troponin I level 01/29/2020   Hematemesis 01/09/2020   Hospital discharge follow-up 11/26/2019   Peripheral neuropathy 08/11/2019   Obesity (BMI 30-39.9) 08/08/2019   Acute right hip pain 06/17/2019   Inability to ambulate due to hip 06/17/2019   CKD (chronic kidney disease) stage 4, GFR 15-29 ml/min (HCC)    Hypervolemia    Full code status 05/20/2019   Pleuritic chest pain    Acute on chronic heart failure with preserved ejection fraction (HFpEF) (HCC)    Chronic systolic CHF (congestive heart failure) (HCC) 03/01/2019   Iron  deficiency anemia 12/31/2018   Atopic dermatitis 11/24/2018   Screening for colon cancer 11/06/2017   Type 2 diabetes mellitus (HCC) 11/06/2017   Hidradenitis 06/09/2017   Atherosclerotic heart disease of native coronary artery without angina pectoris 06/09/2017   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   Obstructive sleep apnea, adult 06/09/2017   Cigarette nicotine  dependence with nicotine -induced disorder 06/09/2017   Diabetic polyneuropathy associated with type 2 diabetes mellitus (HCC) 06/09/2017   Allergic rhinitis due to pollen 06/09/2017   Mixed hyperlipidemia 06/09/2017   COPD (chronic obstructive pulmonary disease) (HCC) 06/09/2017   Wheezing 06/09/2017   Dysuria 06/09/2017   Essential hypertension 06/09/2017   Personal history of other malignant neoplasm of kidney 06/09/2017   Pain in right hip 06/09/2017   Secondary hyperparathyroidism, not elsewhere classified (HCC) 06/09/2017   Tinea corporis 06/09/2017    Orientation RESPIRATION BLADDER Height & Weight     Self, Time, Situation, Place  Normal   Weight:   Height:  BEHAVIORAL SYMPTOMS/MOOD NEUROLOGICAL BOWEL NUTRITION STATUS        Diet  AMBULATORY STATUS COMMUNICATION OF NEEDS Skin   Limited Assist Verbally Normal                       Personal Care Assistance Level of Assistance  Bathing, Dressing Bathing Assistance: Limited assistance    Dressing Assistance: Limited assistance     Functional Limitations Info             SPECIAL CARE FACTORS FREQUENCY  PT (By licensed PT), OT (By licensed OT)     PT Frequency: 3x/week OT Frequency: 3x/week            Contractures Contractures Info: Not present    Additional Factors Info  Insulin  Sliding Scale               Current Medications (11/16/2023):  This is the current hospital active medication list Current Facility-Administered Medications  Medication Dose Route Frequency Provider Last Rate Last Admin   amLODipine  (NORVASC ) tablet 5 mg  5 mg Oral BID Tan, Ting Xu, MD   5 mg at 11/16/23 0906   carvedilol  (COREG ) tablet 12.5 mg  12.5 mg Oral BID WC Tan, Ting Xu, MD   12.5 mg at 11/16/23 4034   furosemide  (LASIX ) tablet 40 mg  40 mg Oral Daily Tan, Ting Xu, MD   40 mg at 11/16/23 7425   insulin  aspart (novoLOG ) injection 0-15 Units  0-15 Units Subcutaneous TID [facility] Jacquie Maudlin, MD   5 Units at 11/16/23 1122   insulin  aspart (novoLOG ) injection 4 Units  4 Units Subcutaneous TID WC Jacquie Maudlin, MD   4 Units at 11/16/23 1122   [START ON 11/21/2023] metolazone (ZAROXOLYN) tablet 2.5 mg  2.5 mg Oral Weekly Tan, Richard Champion, MD       pantoprazole  (PROTONIX ) EC tablet 40 mg  40 mg Oral Daily Tan, Ting Xu, MD   40 mg at 11/16/23 9563   rosuvastatin  (CRESTOR ) tablet 10 mg  10 mg Oral Daily Tan, Ting Xu, MD   10 mg at 11/16/23 1123   Current Outpatient Medications  Medication Sig Dispense Refill   acetaminophen  (TYLENOL ) 325 MG tablet Take 2 tablets (650 mg total) by mouth every 6 (six) hours as needed for mild pain or moderate pain (or Fever >/= 101).     albuterol  (VENTOLIN  HFA) 108 (90 Base) MCG/ACT inhaler Inhale 1-2 puffs into the lungs every 4 (four) hours as needed for wheezing or shortness of breath. 18 g 0   amLODipine  (NORVASC ) 5 MG tablet Take 1 tablet (5 mg total) by mouth 2 (two) times daily. Skip the dose if systolic BP less than 140 mmHg 180 tablet 3    apixaban  (ELIQUIS ) 5 MG TABS tablet Take 1 tablet (5 mg total) by mouth 2 (two) times daily. 60 tablet 5   budeson-glycopyrrolate -formoterol  (BREZTRI  AEROSPHERE) 160-9-4.8 MCG/ACT AERO inhaler Inhale 2 puffs into the lungs 2 (two) times daily. 2 each 3   carvedilol  (COREG ) 12.5 MG tablet Take 1 tablet (12.5 mg total) by mouth 2 (two) times daily. 180 tablet 3   cyanocobalamin  1000 MCG tablet Take 1 tablet (1,000 mcg total) by mouth daily. 90 tablet 1   diphenhydrAMINE  (BENADRYL ) 25 mg capsule Take 1 capsule (25 mg total) by mouth every 8 (eight) hours as needed for itching. 30 capsule 0   ferrous sulfate  325 (65 FE) MG tablet Take 325 mg by mouth every evening.  furosemide  (LASIX ) 40 MG tablet Take 1 tablet (40 mg total) by mouth daily. 90 tablet 3   hydrALAZINE  (APRESOLINE ) 100 MG tablet Take 1 tablet (100 mg total) by mouth 3 (three) times daily as needed (SBP >160). 100 tablet 1   insulin  glargine, 2 Unit Dial , (TOUJEO  MAX SOLOSTAR) 300 UNIT/ML Solostar Pen Inject 10 Units into the skin at bedtime.     Insulin  lispro (HUMALOG  JUNIOR KWIKPEN) 100 UNIT/ML Inject 0-10 Units into the skin 3 (three) times daily.     ipratropium-albuterol  (DUONEB) 0.5-2.5 (3) MG/3ML SOLN Take 3 mLs by nebulization every 6 (six) hours as needed. 270 mL 0   methocarbamol  (ROBAXIN ) 750 MG tablet Take 1 tablet (750 mg total) by mouth every 6 (six) hours as needed for muscle spasms. 120 tablet 1   metolazone (ZAROXOLYN) 2.5 MG tablet Take once weekly on Tuesdays 4 tablet 0   pantoprazole  (PROTONIX ) 40 MG tablet Take 1 tablet (40 mg total) by mouth daily.     potassium chloride  SA (KLOR-CON  M) 20 MEQ tablet Take 1 tablet (20 mEq total) by mouth daily.     rosuvastatin  (CRESTOR ) 10 MG tablet Take 1 tablet (10 mg total) by mouth at bedtime. 90 tablet 1   Skin Protectants, Misc. (PERIGUARD EX) Apply 1 Application topically in the morning, at noon, and at bedtime.     sodium bicarbonate  650 MG tablet Take 2 tablets (1,300 mg  total) by mouth 3 (three) times daily for 14 days, THEN 1 tablet (650 mg total) 3 (three) times daily. 354 tablet 0   ACCU-CHEK GUIDE test strip Use to check blood sugars 4 times a day E11.65 400 strip 0   Accu-Chek Softclix Lancets lancets Use to check blood sugars 4 times a day   E11.65 400 each 3   Blood Glucose Monitoring Suppl (TRUE METRIX AIR GLUCOSE METER) DEVI 1 Device by Does not apply route 2 (two) times daily. 1 Device 0   Insulin  Pen Needle (NOVOFINE PEN NEEDLE) 32G X 6 MM MISC Use as directed with toujeo  pen 100 each 5     Discharge Medications: Please see discharge summary for a list of discharge medications.  Relevant Imaging Results:  Relevant Lab Results:   Additional Information    Arminda Landmark, RN

## 2023-11-16 NOTE — ED Provider Notes (Signed)
 Emergency Medicine Observation Re-evaluation Note  Physical Exam   BP (!) 147/71   Pulse 73   Temp 98.2 F (36.8 C)   Resp (!) 25   SpO2 99%   Patient appears in no acute distress.  ED Course / MDM   No reported events during my shift at the time of this note.   Pt is awaiting dispo from consultants   Buell Carmin MD    Buell Carmin, MD 11/16/23 [phone]

## 2023-11-16 NOTE — ED Notes (Signed)
 Pt ambulating with x1 assist with PT at this time.

## 2023-11-16 NOTE — Progress Notes (Signed)
 Physical Therapy Treatment Patient Details Name: Larry Callahan MRN: [id number] DOB: 1955-07-10 Today's Date: 11/16/2023   History of Present Illness Larry Callahan is a 68 y.o. male with a history of HFrEF, COPD, CKD, hypertension, type 2 diabetes, atrial flutter on Eliquis  who presents with apparent blood in his stool.  The patient noted several episodes today in which he had light red blood in the stool.  He denies any dark tarry stool. MD assessment includes blood in stool.    PT Comments  Pt still feeling weak and far from his baseline, however did do considerably better today with all aspects of mobility.  He was able to ambulate ~150 ft with walker, he clearly was reliant on the walker and reported fatigue with the effort but ultimately did much better than on yesterday's eval.  Pt will benefit from further PT, continue with POC.     If plan is discharge home, recommend the following: A little help with walking and/or transfers;A little help with bathing/dressing/bathroom;Assistance with cooking/housework;Assist for transportation;Help with stairs or ramp for entrance   Can travel by private vehicle     Yes  Equipment Recommendations   (TBD)    Recommendations for Other Services       Precautions / Restrictions Precautions Precautions: Fall Recall of Precautions/Restrictions: Impaired     Mobility  Bed Mobility Overal bed mobility: Needs Assistance Bed Mobility: Rolling Rolling: Contact guard assist   Supine to sit: Min assist Sit to supine: Contact guard assist   General bed mobility comments: Pt presents with increased mobility effort and execution this date    Transfers Overall transfer level: Needs assistance Equipment used: Rolling walker (2 wheels) Transfers: Sit to/from Stand Sit to Stand: Min assist           General transfer comment: pt able to rise much more confidently with less assist today from elevated ED gurney, likely would have  struggled from lower surface height.  Cuing for appropriate sequencing, weight shifts and AD use    Ambulation/Gait Ambulation/Gait assistance: Min assist Gait Distance (Feet): 150 Feet Assistive device: Rolling walker (2 wheels)         General Gait Details: notedly improved ability to maintain balance and actually ambulate with some confidence this date.  Still highly reliant on the walker and subjectively reporting significant fatigue with the effort but he showed good determintation and did not have any overt LOBs/safety issues with RW   Stairs             Wheelchair Mobility     Tilt Bed    Modified Rankin (Stroke Patients Only)       Balance Overall balance assessment: History of Falls, Needs assistance Sitting-balance support: Feet supported Sitting balance-Leahy Scale: Good     Standing balance support: Reliant on assistive device for balance, Bilateral upper extremity supported, During functional activity Standing balance-Leahy Scale: Fair                              Hotel manager: Impaired Factors Affecting Communication: Hearing impaired  Cognition Arousal: Alert Behavior During Therapy: WFL for tasks assessed/performed, Restless   PT - Cognitive impairments: No family/caregiver present to determine baseline                       PT - Cognition Comments: appropriate, clear about his feeling unable to safely return home Following commands: Impaired Following commands  impaired: Follows one step commands inconsistently, Follows multi-step commands inconsistently    Cueing Cueing Techniques: Verbal cues, Tactile cues  Exercises      General Comments General comments (skin integrity, edema, etc.): Pt with marked improvement from yesterday's eval, still not at baseline and voicing real      Pertinent Vitals/Pain Pain Assessment Pain Location: endorses minimal soreness from laying in bed    Home  Living                          Prior Function            PT Goals (current goals can now be found in the care plan section) Progress towards PT goals: Progressing toward goals    Frequency    Min 2X/week      PT Plan      Co-evaluation              AM-PAC PT "6 Clicks" Mobility   Outcome Measure  Help needed turning from your back to your side while in a flat bed without using bedrails?: A Little Help needed moving from lying on your back to sitting on the side of a flat bed without using bedrails?: A Little Help needed moving to and from a bed to a chair (including a wheelchair)?: A Little Help needed standing up from a chair using your arms (e.g., wheelchair or bedside chair)?: A Little Help needed to walk in hospital room?: A Little Help needed climbing 3-5 steps with a railing? : A Lot 6 Click Score: 17    End of Session Equipment Utilized During Treatment: Gait belt Activity Tolerance: Patient tolerated treatment well Patient left: in bed;Other (comment);with call bell/phone within reach Nurse Communication: Mobility status PT Visit Diagnosis: Repeated falls (R29.6);History of falling (Z91.81);Unsteadiness on feet (R26.81);Muscle weakness (generalized) (M62.81);Difficulty in walking, not elsewhere classified (R26.2)     Time: 1111-1140 PT Time Calculation (min) (ACUTE ONLY): 29 min  Charges:    $Gait Training: 8-22 mins $Therapeutic Exercise: 8-22 mins PT General Charges $$ ACUTE PT VISIT: 1 Visit                     Larry Callahan, DPT 11/16/2023, 12:47 PM

## 2023-11-16 NOTE — Progress Notes (Signed)
 Education Assessment and Provision: Cardiogist: Laurence Pons ( Dr. [place] [facility] patient)  Detailed education and instructions provided on heart failure disease management including the following below since patient had a recent admission for Congestive Heart Failure on 11/04/23.  Signs and symptoms of Heart Failure When to call the physician Importance of daily weights Low sodium diet Fluid restriction Medication management Anticipated future follow-up appointments  Patient education given on each of the above topics.  Patient acknowledges understanding via teach back method and acceptance of all instructions.  Education Materials:  "Living Better With Heart Failure" Booklet, HF zone tool, & Daily Weight Tracker Tool.  Patient has scale at home: No-Patient said he was going to a facility after this discharge. Patient has pill box at home: Yes    Appointment was already scheduled with Alyssa Abernathy on 11/20/23 @ 11:40 from last admission.    Navigator will sign off at this time.   Celedonio Coil, RN, BSN [facility] Heart Failure Navigator Secure Chat Only

## 2023-11-16 NOTE — Inpatient Diabetes Management (Addendum)
 Inpatient Diabetes Program Recommendations  AACE/ADA: New Consensus Statement on Inpatient Glycemic Control   Target Ranges:  Prepandial:   less than 140 mg/dL      Peak postprandial:   less than 180 mg/dL (1-2 hours)      Critically ill patients:  140 - 180 mg/dL    Latest Reference Range & Units 11/16/23 08:12  Glucose-Capillary 70 - 99 mg/dL 161 (H)    Review of Glycemic Control  Diabetes history: DM2 Outpatient Diabetes medications:  Toujeo  10 units at bedtime, Humalog  0-10 units TID with meals Current orders for Inpatient glycemic control: Novolog  0-15 units TID with meals, Novolog  4 units TID with meals   Inpatient Diabetes Program Recommendations:     Insulin : If CBGs become consistently over 180 mg/dl with Novolog  correction and meal coverage, may want to consider ordering Semglee  10 units daily.  Diet: Please discontinue Regular diet and order Carb Modified diet.  NOTE: Patient was just recently inpatient 11/04/23-11/13/23 and presented to ED on 11/15/23 with rectal bleeding. Per note by Dr. Demetrios Finders on 11/15/23, patient reports he can not safely go home as he is not able to care for himself and he would like SNF placement.   Thanks, Beacher Limerick, RN, MSN, CDCES Diabetes Coordinator Inpatient Diabetes Program [phone] (Team Pager from 8am to 5pm)

## 2023-11-17 LAB — CBG MONITORING, ED
Glucose-Capillary: 115 mg/dL — ABNORMAL HIGH (ref 70–99)
Glucose-Capillary: 194 mg/dL — ABNORMAL HIGH (ref 70–99)
Glucose-Capillary: 142 mg/dL — ABNORMAL HIGH (ref 70–99)
Glucose-Capillary: 170 mg/dL — ABNORMAL HIGH (ref 70–99)

## 2023-11-17 NOTE — ED Provider Notes (Signed)
-----------------------------------------   5:13 AM on 11/17/2023 -----------------------------------------   Blood pressure (!) 158/75, pulse 70, temperature 97.8 F (36.6 C), temperature source Oral, resp. rate 16, SpO2 98%.  The patient is calm and cooperative at this time.  There have been no acute events since the last update.  Awaiting disposition plan from Social Work team.   Cameron Schwinn J, MD 11/17/23 [phone]

## 2023-11-17 NOTE — Inpatient Diabetes Management (Signed)
 Inpatient Diabetes Program Recommendations  AACE/ADA: New Consensus Statement on Inpatient Glycemic Control   Target Ranges:  Prepandial:   less than 140 mg/dL      Peak postprandial:   less than 180 mg/dL (1-2 hours)      Critically ill patients:  140 - 180 mg/dL    Latest Reference Range & Units 11/16/23 08:12 11/16/23 10:59 11/16/23 16:39 11/16/23 20:53 11/17/23 07:30  Glucose-Capillary 70 - 99 mg/dL 664 (H) 403 (H) 474 (H) 71 115 (H)   Review of Glycemic Control  Diabetes history: DM2 Outpatient Diabetes medications:  Toujeo  10 units at bedtime, Humalog  0-10 units TID with meals Current orders for Inpatient glycemic control: Novolog  0-15 units TID with meals, Novolog  4 units TID with meals   Inpatient Diabetes Program Recommendations:     Insulin : Agree with current orders.     NOTE: Patient was just recently inpatient 11/04/23-11/13/23 and presented to ED on 11/15/23 with rectal bleeding. Per note by Dr. Demetrios Finders on 11/15/23, patient reports he can not safely go home as he is not able to care for himself and he would like SNF placement. Noted consult; chart reviewed. Agree with current orders for glycemic control. Will sign off consult. Please reconsult if needed.  Thanks, Beacher Limerick, RN, MSN, CDCES Diabetes Coordinator Inpatient Diabetes Program [phone] (Team Pager from 8am to 5pm)

## 2023-11-17 NOTE — ED Notes (Signed)
 Fall bundle reevaluated and implemented.

## 2023-11-17 NOTE — ED Notes (Signed)
 Pt given phone to speak to nephew

## 2023-11-17 NOTE — TOC Progression Note (Addendum)
 Transition of Care [facility]) - Progression Note    Patient Details  Name: Larry Callahan MRN: [id number] Date of Birth: 1956/02/11  [facility] of [facility]) CM/SW Contact  Arminda Landmark, RN Phone Number: 11/17/2023, 10:58 AM  Clinical Narrative:    Patient has been accepted at [facility]. Spoke with him and he's in agreement for us  to start the auth process and is wiling to be discharged there. 1115: Spoke with Bernardo Bridgeman from [facility] and realized he's out of SNF days and would need to self pay to go there- $10,000 up front. Discussed with pt and recommended he go home with [facility]. He stated he's not sure his nephew wants him to return there. 1330: Spoke to his nephew Andrea Banda and he states [name] is welcome to return there if he wants. Oral is able to pick him up at [place]. Will discuss with patient. 1430: Spoke with patient that he needs to go home with [facility] and he started crying. He stated, "I can't go back to my nephews, I don't feel safe and he expects too much out of me". He states the nephew expects him to cook and he just isn't capable of doing the things his nephew asks of him. He even said, "I'm afraid he'll kill me in my sleep and I don't want to go out like that". He then asked this RNCM to "stop coming in with bad news and that I upset him so much that now he can't eat his lunch". TOC to continue to follow.    Expected Discharge Plan: Skilled Nursing Facility Barriers to Discharge: Continued Medical Work up  Expected Discharge Plan and Services   Discharge Planning Services: CM Consult   Living arrangements for the past 2 months: Single Family Home                   DME Agency:  (He has a walker and w/c)                   Social Determinants of Health (SDOH) Interventions SDOH Screenings   Food Insecurity: No Food Insecurity (11/16/2023)  Housing: Low Risk  (11/16/2023)  Transportation Needs: No Transportation Needs (11/16/2023)  Utilities: Not At Risk  (11/04/2023)  Alcohol Screen: Low Risk  (03/29/2022)  Depression (PHQ2-9): Low Risk  (09/13/2022)  Financial Resource Strain: Low Risk  (11/16/2023)  Social Connections: Unknown (11/04/2023)  Tobacco Use: High Risk (11/16/2023)    Readmission Risk Interventions    12/01/2021   12:25 PM 08/07/2021    2:36 PM 06/28/2021    2:26 PM  Readmission Risk Prevention Plan  Transportation Screening Complete Complete Complete  Medication Review Oceanographer) Complete Complete Complete  PCP or Specialist appointment within 3-5 days of discharge Complete Complete Complete  HRI or Home Care Consult Complete Complete Complete  SW Recovery Care/Counseling Consult Complete Complete Complete  Palliative Care Screening Complete Complete Not Applicable  Skilled Nursing Facility Complete Complete Not Applicable

## 2023-11-18 LAB — CBG MONITORING, ED
Glucose-Capillary: 142 mg/dL — ABNORMAL HIGH (ref 70–99)
Glucose-Capillary: 128 mg/dL — ABNORMAL HIGH (ref 70–99)
Glucose-Capillary: 133 mg/dL — ABNORMAL HIGH (ref 70–99)
Glucose-Capillary: 87 mg/dL (ref 70–99)

## 2023-11-18 NOTE — TOC Progression Note (Signed)
 Transition of Care [facility]) - Progression Note    Patient Details  Name: Larry Callahan MRN: [id number] Date of Birth: [birth year]  Transition of Care [facility]) CM/SW Contact  [place] L Deondra Wigger, [place] Phone Number: 11/18/2023, 3:41 PM  Clinical Narrative:     CSW spoke with nephew, Oral Butler. [name] advised that patient is welcome to return to his home. [name] denied the concerns and stated that he does not make his uncle cook. He stated that he tries to feed him healthy meals but all pt wants is sandwiches. He stated that patient will lie in bed all day and use the bathroom but he won't get up and clean. [name] advised that since Pt has been living with him he has waited on him "hand and foot". He advised that meals and wheels was contacted but they never brought meals to the home. [name] stated that his uncle can return home. HH was discussed. CSW will follow up with patient.   [name] advised that patient will be back in the ED because that is what he does.   Expected Discharge Plan: Skilled Nursing Facility Barriers to Discharge: Continued Medical Work up  Expected Discharge Plan and Services   Discharge Planning Services: CM Consult   Living arrangements for the past 2 months: Single Family Home                   DME Agency:  (He has a walker and w/c)                   Social Determinants of Health (SDOH) Interventions SDOH Screenings   Food Insecurity: No Food Insecurity (11/16/2023)  Housing: Low Risk  (11/16/2023)  Transportation Needs: No Transportation Needs (11/16/2023)  Utilities: Not At Risk (11/04/2023)  Alcohol Screen: Low Risk  (03/29/2022)  Depression (PHQ2-9): Low Risk  (09/13/2022)  Financial Resource Strain: Low Risk  (11/16/2023)  Social Connections: Unknown (11/04/2023)  Tobacco Use: High Risk (11/16/2023)    Readmission Risk Interventions    12/01/2021   12:25 PM 08/07/2021    2:36 PM 06/28/2021    2:26 PM  Readmission Risk Prevention  Plan  Transportation Screening Complete Complete Complete  Medication Review Oceanographer) Complete Complete Complete  PCP or Specialist appointment within 3-5 days of discharge Complete Complete Complete  HRI or Home Care Consult Complete Complete Complete  SW Recovery Care/Counseling Consult Complete Complete Complete  Palliative Care Screening Complete Complete Not Applicable  Skilled Nursing Facility Complete Complete Not Applicable

## 2023-11-18 NOTE — TOC Progression Note (Signed)
 Transition of Care [facility]) - Progression Note    Patient Details  Name: Larry Callahan MRN: [id number] Date of Birth: 1955-07-22  Transition of Care [facility]) CM/SW Contact  [place] L Hayslee Casebolt, [place] Phone Number: 11/18/2023, 4:06 PM  Clinical Narrative:     CSW sent an e-mail to Ashley Blades ([email or web address]) to refer patient to [employer].   Expected Discharge Plan: Skilled Nursing Facility Barriers to Discharge: Continued Medical Work up  Expected Discharge Plan and Services   Discharge Planning Services: CM Consult   Living arrangements for the past 2 months: Single Family Home                   DME Agency:  (He has a walker and w/c)                   Social Determinants of Health (SDOH) Interventions SDOH Screenings   Food Insecurity: No Food Insecurity (11/16/2023)  Housing: Low Risk  (11/16/2023)  Transportation Needs: No Transportation Needs (11/16/2023)  Utilities: Not At Risk (11/04/2023)  Alcohol Screen: Low Risk  (03/29/2022)  Depression (PHQ2-9): Low Risk  (09/13/2022)  Financial Resource Strain: Low Risk  (11/16/2023)  Social Connections: Unknown (11/04/2023)  Tobacco Use: High Risk (11/16/2023)    Readmission Risk Interventions    12/01/2021   12:25 PM 08/07/2021    2:36 PM 06/28/2021    2:26 PM  Readmission Risk Prevention Plan  Transportation Screening Complete Complete Complete  Medication Review Oceanographer) Complete Complete Complete  PCP or Specialist appointment within 3-5 days of discharge Complete Complete Complete  HRI or Home Care Consult Complete Complete Complete  SW Recovery Care/Counseling Consult Complete Complete Complete  Palliative Care Screening Complete Complete Not Applicable  Skilled Nursing Facility Complete Complete Not Applicable

## 2023-11-18 NOTE — ED Provider Notes (Signed)
-----------------------------------------   5:25 AM on 11/18/2023 -----------------------------------------   Blood pressure (!) 145/67, pulse 80, temperature 98.4 F (36.9 C), resp. rate 18, SpO2 97%.  The patient is calm and cooperative at this time.  There have been no acute events since the last update.  Awaiting disposition plan from Social Work team.   Zion Lint J, MD 11/18/23 [phone]

## 2023-11-18 NOTE — TOC Transition Note (Addendum)
 Transition of Care [facility]) - Discharge Note   Patient Details  Name: Larry Callahan MRN: [id number] Date of Birth: 1956-03-18  Transition of Care [facility]) CM/SW Contact:  [place] L Kaeleb Emond, LCSW Phone Number: 11/18/2023, 5:16 PM   Clinical Narrative:     CSW contacted [facility] and spoke with Bartholomew Light. Patient needs were discussed. Services will be set up for patient by Monday. Patient will receive nursing 5x a week, PT, OT and Social Work.   Nephew, Oral Randal Bury contacted and he advised that his uncle is welcome to return to the home and he will pick him up tomorrow at noon. CSW advised that [facility] [employer] [facility] was referred and [facility] for [facility].   No further TOC needs. TOC signing off.    Barriers to Discharge: Continued Medical Work up   Patient Goals and CMS Choice     Choice offered to / list presented to : Patient      Discharge Placement                       Discharge Plan and Services Additional resources added to the After Visit Summary for     Discharge Planning Services: CM Consult              DME Agency:  (He has a walker and w/c)                  Social Drivers of Health (SDOH) Interventions SDOH Screenings   Food Insecurity: No Food Insecurity (11/16/2023)  Housing: Low Risk  (11/16/2023)  Transportation Needs: No Transportation Needs (11/16/2023)  Utilities: Not At Risk (11/04/2023)  Alcohol Screen: Low Risk  (03/29/2022)  Depression (PHQ2-9): Low Risk  (09/13/2022)  Financial Resource Strain: Low Risk  (11/16/2023)  Social Connections: Unknown (11/04/2023)  Tobacco Use: High Risk (11/16/2023)     Readmission Risk Interventions    12/01/2021   12:25 PM 08/07/2021    2:36 PM 06/28/2021    2:26 PM  Readmission Risk Prevention Plan  Transportation Screening Complete Complete Complete  Medication Review Oceanographer) Complete Complete Complete  PCP or Specialist appointment within 3-5 days of discharge Complete Complete Complete  HRI or Home Care  Consult Complete Complete Complete  SW Recovery Care/Counseling Consult Complete Complete Complete  Palliative Care Screening Complete Complete Not Applicable  Skilled Nursing Facility Complete Complete Not Applicable

## 2023-11-19 LAB — CBG MONITORING, ED: Glucose-Capillary: 153 mg/dL — ABNORMAL HIGH (ref 70–99)

## 2023-11-19 MED ORDER — DOCUSATE SODIUM 100 MG PO CAPS
100.0000 mg | ORAL_CAPSULE | Freq: Two times a day (BID) | ORAL | Status: DC
  Administered 2023-11-19: 100 mg via ORAL
  Filled 2023-11-19: qty 1

## 2023-11-19 MED ORDER — IPRATROPIUM-ALBUTEROL 0.5-2.5 (3) MG/3ML IN SOLN
3.0000 mL | Freq: Once | RESPIRATORY_TRACT | Status: AC
  Administered 2023-11-19: 3 mL via RESPIRATORY_TRACT
  Filled 2023-11-19: qty 3

## 2023-11-19 MED ORDER — POLYETHYLENE GLYCOL 3350 17 G PO PACK
17.0000 g | PACK | Freq: Every day | ORAL | Status: DC
  Administered 2023-11-19: 17 g via ORAL
  Filled 2023-11-19: qty 1

## 2023-11-19 NOTE — Discharge Instructions (Signed)
 [facility] [phone] is in place for Nursing, Physical Therapy, Social Work and Occupational Therapy. Bartholomew Light, coordinator, will call to schedule initial home visit to review services with you.   [facility]-They were contacted and they should be giving you call to set up home delivery of meals.

## 2023-11-19 NOTE — TOC Transition Note (Signed)
 Transition of Care [facility]) - Discharge Note   Patient Details  Name: Larry Callahan MRN: [id number] Date of Birth: Aug 06, 1955  Transition of Care [facility]) CM/SW Contact:  [place] L Logyn Kendrick, LCSW Phone Number: 11/19/2023, 9:39 AM   Clinical Narrative:     CSW met with patient as requested. Pt complained of abd pain and difficulties breathing. CSW advised that pt was seen by medical team and pt is medically ready for discharge. Pt advised that he is scared to return home because "I will end up right back here". CSW explained to patient that [facility] has been put in place for him to receive RN, PT, OT and SW. CSW advised that pt needs to allow the opportunity for the services to assist him. CSW advised that pt is lying in bed all day and not motivated. CSW advised that, that is not conducive to healing and building up strength. CSW advised patient that he is medically ready for discharge and his nephew will be here to pick him up by noon today. TOC signing off.     Barriers to Discharge: Continued Medical Work up   Patient Goals and CMS Choice     Choice offered to / list presented to : Patient      Discharge Placement                       Discharge Plan and Services Additional resources added to the After Visit Summary for     Discharge Planning Services: CM Consult              DME Agency:  (He has a walker and w/c)                  Social Drivers of Health (SDOH) Interventions SDOH Screenings   Food Insecurity: No Food Insecurity (11/16/2023)  Housing: Low Risk  (11/16/2023)  Transportation Needs: No Transportation Needs (11/16/2023)  Utilities: Not At Risk (11/04/2023)  Alcohol Screen: Low Risk  (03/29/2022)  Depression (PHQ2-9): Low Risk  (09/13/2022)  Financial Resource Strain: Low Risk  (11/16/2023)  Social Connections: Unknown (11/04/2023)  Tobacco Use: High Risk (11/16/2023)     Readmission Risk Interventions    12/01/2021   12:25 PM 08/07/2021    2:36 PM  06/28/2021    2:26 PM  Readmission Risk Prevention Plan  Transportation Screening Complete Complete Complete  Medication Review Oceanographer) Complete Complete Complete  PCP or Specialist appointment within 3-5 days of discharge Complete Complete Complete  HRI or Home Care Consult Complete Complete Complete  SW Recovery Care/Counseling Consult Complete Complete Complete  Palliative Care Screening Complete Complete Not Applicable  Skilled Nursing Facility Complete Complete Not Applicable

## 2023-11-19 NOTE — ED Provider Notes (Signed)
-----------------------------------------   4:32 AM on 11/19/2023 -----------------------------------------   Blood pressure 130/60, pulse 73, temperature 98.8 F (37.1 C), temperature source Oral, resp. rate 18, SpO2 94%.  The patient is calm and cooperative at this time.  There have been no acute events since the last update.  Awaiting disposition plan from case management/social work.    Caresse Sedivy, Clover Dao, DO 11/19/23 [phone]

## 2023-11-19 NOTE — ED Notes (Signed)
 Pt was found to be wet with urine - this RN cleaned pt and changed linens and pad and diaper.

## 2023-11-19 NOTE — ED Notes (Signed)
 Pt informed he was going home today with nephew. Pt expressed he was having " a COPD flare up". This nurse placed pt on pulse ox, sating at 98%, even unlabored respirations noted. Pt then stated he was having abd pain but can not recall when he's had a BM. This RN gave pt scheduled stool softeners. Pt stated "That will not help, I need to speak to my Child psychotherapist and doctor". MD Karlynn Oyster notified and at bedside.

## 2023-11-19 NOTE — ED Provider Notes (Signed)
-----------------------------------------   10:50 AM on 11/19/2023 -----------------------------------------   Blood pressure (!) 148/75, pulse 72, temperature 98.8 F (37.1 C), temperature source Oral, resp. rate 18, SpO2 94%.  The patient is calm and cooperative at this time.  Patient will be picked up by nephew today and safe for discharge.  At 1 point he did complain that he was potentially having a COPD flareup.  Do not hear any wheezing at this time but gave 1 DuoNeb for symptomatic treatment.  Otherwise safe for discharge after being medically cleared.   Kandee Orion, MD 11/19/23 1050

## 2023-11-20 ENCOUNTER — Inpatient Hospital Stay: Admitting: Nurse Practitioner

## 2023-11-22 ENCOUNTER — Emergency Department

## 2023-11-22 ENCOUNTER — Observation Stay
Admission: EM | Admit: 2023-11-22 | Discharge: 2023-12-18 | Disposition: A | Discharge status: Home / Self Care | Attending: Internal Medicine | Admitting: Internal Medicine

## 2023-11-22 ENCOUNTER — Other Ambulatory Visit: Payer: Self-pay

## 2023-11-22 DIAGNOSIS — N179 Acute kidney failure, unspecified: Secondary | ICD-10-CM | POA: Diagnosis not present

## 2023-11-22 DIAGNOSIS — Z794 Long term (current) use of insulin: Secondary | ICD-10-CM | POA: Insufficient documentation

## 2023-11-22 DIAGNOSIS — K148 Other diseases of tongue: Secondary | ICD-10-CM

## 2023-11-22 DIAGNOSIS — R220 Localized swelling, mass and lump, head: Secondary | ICD-10-CM

## 2023-11-22 DIAGNOSIS — E1122 Type 2 diabetes mellitus with diabetic chronic kidney disease: Secondary | ICD-10-CM | POA: Diagnosis not present

## 2023-11-22 DIAGNOSIS — I2510 Atherosclerotic heart disease of native coronary artery without angina pectoris: Secondary | ICD-10-CM | POA: Diagnosis not present

## 2023-11-22 DIAGNOSIS — F17210 Nicotine dependence, cigarettes, uncomplicated: Secondary | ICD-10-CM | POA: Diagnosis not present

## 2023-11-22 DIAGNOSIS — I639 Cerebral infarction, unspecified: Secondary | ICD-10-CM | POA: Diagnosis present

## 2023-11-22 DIAGNOSIS — I5032 Chronic diastolic (congestive) heart failure: Secondary | ICD-10-CM

## 2023-11-22 DIAGNOSIS — R062 Wheezing: Secondary | ICD-10-CM | POA: Diagnosis present

## 2023-11-22 DIAGNOSIS — Z1152 Encounter for screening for COVID-19: Secondary | ICD-10-CM | POA: Insufficient documentation

## 2023-11-22 DIAGNOSIS — R079 Chest pain, unspecified: Secondary | ICD-10-CM | POA: Diagnosis present

## 2023-11-22 DIAGNOSIS — N184 Chronic kidney disease, stage 4 (severe): Secondary | ICD-10-CM | POA: Diagnosis not present

## 2023-11-22 DIAGNOSIS — I130 Hypertensive heart and chronic kidney disease with heart failure and stage 1 through stage 4 chronic kidney disease, or unspecified chronic kidney disease: Secondary | ICD-10-CM | POA: Insufficient documentation

## 2023-11-22 DIAGNOSIS — J441 Chronic obstructive pulmonary disease with (acute) exacerbation: Secondary | ICD-10-CM | POA: Diagnosis not present

## 2023-11-22 DIAGNOSIS — I10 Essential (primary) hypertension: Secondary | ICD-10-CM | POA: Diagnosis not present

## 2023-11-22 DIAGNOSIS — E1129 Type 2 diabetes mellitus with other diabetic kidney complication: Secondary | ICD-10-CM | POA: Diagnosis present

## 2023-11-22 DIAGNOSIS — E785 Hyperlipidemia, unspecified: Secondary | ICD-10-CM | POA: Diagnosis present

## 2023-11-22 DIAGNOSIS — I509 Heart failure, unspecified: Secondary | ICD-10-CM | POA: Insufficient documentation

## 2023-11-22 DIAGNOSIS — Z59819 Housing instability, housed unspecified: Secondary | ICD-10-CM

## 2023-11-22 DIAGNOSIS — I4892 Unspecified atrial flutter: Secondary | ICD-10-CM | POA: Diagnosis present

## 2023-11-22 DIAGNOSIS — D509 Iron deficiency anemia, unspecified: Secondary | ICD-10-CM | POA: Diagnosis present

## 2023-11-22 DIAGNOSIS — I5022 Chronic systolic (congestive) heart failure: Secondary | ICD-10-CM | POA: Diagnosis present

## 2023-11-22 DIAGNOSIS — Z8673 Personal history of transient ischemic attack (TIA), and cerebral infarction without residual deficits: Secondary | ICD-10-CM

## 2023-11-22 DIAGNOSIS — N189 Chronic kidney disease, unspecified: Secondary | ICD-10-CM | POA: Diagnosis present

## 2023-11-22 DIAGNOSIS — E872 Acidosis, unspecified: Secondary | ICD-10-CM | POA: Diagnosis present

## 2023-11-22 LAB — COMPREHENSIVE METABOLIC PANEL WITH GFR
ALT: 11 U/L (ref 0–44)
AST: 16 U/L (ref 15–41)
Albumin: 2.7 g/dL — ABNORMAL LOW (ref 3.5–5.0)
Alkaline Phosphatase: 72 U/L (ref 38–126)
Anion gap: 10 (ref 5–15)
BUN: 88 mg/dL — ABNORMAL HIGH (ref 8–23)
CO2: 18 mmol/L — ABNORMAL LOW (ref 22–32)
Calcium: 8.3 mg/dL — ABNORMAL LOW (ref 8.9–10.3)
Chloride: 112 mmol/L — ABNORMAL HIGH (ref 98–111)
Creatinine, Ser: 4.72 mg/dL — ABNORMAL HIGH (ref 0.61–1.24)
GFR, Estimated: 13 mL/min — ABNORMAL LOW (ref >60)
Glucose, Bld: 130 mg/dL — ABNORMAL HIGH (ref 70–99)
Potassium: 5.1 mmol/L (ref 3.5–5.1)
Sodium: 140 mmol/L (ref 135–145)
Total Bilirubin: 0.8 mg/dL (ref 0.0–1.2)
Total Protein: 7.2 g/dL (ref 6.5–8.1)

## 2023-11-22 LAB — RESP PANEL BY RT-PCR (RSV, FLU A&B, COVID)  RVPGX2
Influenza A by PCR: NEGATIVE
Influenza B by PCR: NEGATIVE
Resp Syncytial Virus by PCR: NEGATIVE
SARS Coronavirus 2 by RT PCR: NEGATIVE

## 2023-11-22 LAB — CBC
HCT: 31.5 % — ABNORMAL LOW (ref 39.0–52.0)
Hemoglobin: 10.1 g/dL — ABNORMAL LOW (ref 13.0–17.0)
MCH: 29.1 pg (ref 26.0–34.0)
MCHC: 32.1 g/dL (ref 30.0–36.0)
MCV: 90.8 fL (ref 80.0–100.0)
Platelets: 234 10*3/uL (ref 150–400)
RBC: 3.47 MIL/uL — ABNORMAL LOW (ref 4.22–5.81)
RDW: 13.7 % (ref 11.5–15.5)
WBC: 4.3 10*3/uL (ref 4.0–10.5)
nRBC: 0 % (ref 0.0–0.2)

## 2023-11-22 LAB — BRAIN NATRIURETIC PEPTIDE: B Natriuretic Peptide: 393.1 pg/mL — ABNORMAL HIGH (ref 0.0–100.0)

## 2023-11-22 LAB — GLUCOSE, CAPILLARY: Glucose-Capillary: 82 mg/dL (ref 70–99)

## 2023-11-22 MED ORDER — METHYLPREDNISOLONE SODIUM SUCC 125 MG IJ SOLR
80.0000 mg | Freq: Every day | INTRAMUSCULAR | Status: DC
  Administered 2023-11-23: 80 mg via INTRAVENOUS
  Filled 2023-11-22: qty 2

## 2023-11-22 MED ORDER — ALBUTEROL SULFATE (2.5 MG/3ML) 0.083% IN NEBU: 2.5000 mg | INHALATION_SOLUTION | RESPIRATORY_TRACT | Status: DC | PRN

## 2023-11-22 MED ORDER — HEPARIN SODIUM (PORCINE) 5000 UNIT/ML IJ SOLN
5000.0000 [IU] | Freq: Three times a day (TID) | INTRAMUSCULAR | Status: DC
  Administered 2023-11-23 (×2): 5000 [IU] via SUBCUTANEOUS
  Filled 2023-11-22 (×2): qty 1

## 2023-11-22 MED ORDER — PANTOPRAZOLE SODIUM 40 MG PO TBEC
40.0000 mg | DELAYED_RELEASE_TABLET | Freq: Every day | ORAL | Status: DC
  Administered 2023-11-23 – 2023-12-18 (×26): 40 mg via ORAL
  Filled 2023-11-22 (×26): qty 1

## 2023-11-22 MED ORDER — ONDANSETRON HCL 4 MG/2ML IJ SOLN: 4.0000 mg | Freq: Three times a day (TID) | INTRAMUSCULAR | Status: DC | PRN

## 2023-11-22 MED ORDER — HYDRALAZINE HCL 20 MG/ML IJ SOLN: 5.0000 mg | INTRAMUSCULAR | Status: DC | PRN

## 2023-11-22 MED ORDER — CARVEDILOL 6.25 MG PO TABS
12.5000 mg | ORAL_TABLET | Freq: Two times a day (BID) | ORAL | Status: DC
  Administered 2023-11-23 – 2023-11-27 (×10): 12.5 mg via ORAL
  Filled 2023-11-22 (×10): qty 2

## 2023-11-22 MED ORDER — INSULIN GLARGINE-YFGN 100 UNIT/ML ~~LOC~~ SOLN: 7.0000 [IU] | Freq: Every day | SUBCUTANEOUS | Status: DC

## 2023-11-22 MED ORDER — INSULIN ASPART 100 UNIT/ML IJ SOLN
0.0000 [IU] | Freq: Three times a day (TID) | INTRAMUSCULAR | Status: DC
  Administered 2023-11-23: 9 [IU] via SUBCUTANEOUS
  Administered 2023-11-23: 7 [IU] via SUBCUTANEOUS
  Administered 2023-11-23: 9 [IU] via SUBCUTANEOUS
  Administered 2023-11-24: 7 [IU] via SUBCUTANEOUS
  Administered 2023-11-24: 5 [IU] via SUBCUTANEOUS
  Administered 2023-11-24: 7 [IU] via SUBCUTANEOUS
  Filled 2023-11-22 (×6): qty 1

## 2023-11-22 MED ORDER — ACETAMINOPHEN 325 MG PO TABS
650.0000 mg | ORAL_TABLET | Freq: Four times a day (QID) | ORAL | Status: DC | PRN
  Administered 2023-11-25 – 2023-12-04 (×4): 650 mg via ORAL
  Filled 2023-11-22 (×5): qty 2

## 2023-11-22 MED ORDER — VITAMIN B-12 1000 MCG PO TABS
1000.0000 ug | ORAL_TABLET | Freq: Every day | ORAL | Status: DC
  Administered 2023-11-23 – 2023-12-18 (×26): 1000 ug via ORAL
  Filled 2023-11-22 (×26): qty 1

## 2023-11-22 MED ORDER — NICOTINE 21 MG/24HR TD PT24
21.0000 mg | MEDICATED_PATCH | Freq: Every day | TRANSDERMAL | Status: DC
  Filled 2023-11-22 (×3): qty 1

## 2023-11-22 MED ORDER — ROSUVASTATIN CALCIUM 10 MG PO TABS
10.0000 mg | ORAL_TABLET | Freq: Every day | ORAL | Status: DC
  Administered 2023-11-23 – 2023-12-17 (×25): 10 mg via ORAL
  Filled 2023-11-22 (×25): qty 1

## 2023-11-22 MED ORDER — IPRATROPIUM-ALBUTEROL 0.5-2.5 (3) MG/3ML IN SOLN
3.0000 mL | Freq: Three times a day (TID) | RESPIRATORY_TRACT | Status: DC
  Administered 2023-11-23 – 2023-11-26 (×10): 3 mL via RESPIRATORY_TRACT
  Filled 2023-11-22 (×10): qty 3

## 2023-11-22 MED ORDER — INSULIN ASPART 100 UNIT/ML IJ SOLN
0.0000 [IU] | Freq: Every day | INTRAMUSCULAR | Status: DC
  Administered 2023-11-23: 4 [IU] via SUBCUTANEOUS
  Filled 2023-11-22: qty 1

## 2023-11-22 MED ORDER — AMLODIPINE BESYLATE 5 MG PO TABS
5.0000 mg | ORAL_TABLET | Freq: Two times a day (BID) | ORAL | Status: DC
  Administered 2023-11-23 – 2023-12-13 (×40): 5 mg via ORAL
  Filled 2023-11-22 (×40): qty 1

## 2023-11-22 MED ORDER — METHYLPREDNISOLONE SODIUM SUCC 125 MG IJ SOLR
125.0000 mg | Freq: Once | INTRAMUSCULAR | Status: AC
  Administered 2023-11-22: 125 mg via INTRAVENOUS
  Filled 2023-11-22: qty 2

## 2023-11-22 MED ORDER — METHOCARBAMOL 500 MG PO TABS
750.0000 mg | ORAL_TABLET | Freq: Four times a day (QID) | ORAL | Status: DC | PRN
  Administered 2023-11-23: 750 mg via ORAL
  Filled 2023-11-22: qty 2

## 2023-11-22 MED ORDER — IPRATROPIUM-ALBUTEROL 0.5-2.5 (3) MG/3ML IN SOLN
3.0000 mL | RESPIRATORY_TRACT | Status: DC
  Administered 2023-11-22: 3 mL via RESPIRATORY_TRACT
  Filled 2023-11-22: qty 3

## 2023-11-22 MED ORDER — FERROUS SULFATE 325 (65 FE) MG PO TABS
325.0000 mg | ORAL_TABLET | Freq: Every evening | ORAL | Status: DC
  Administered 2023-11-23 – 2023-12-13 (×21): 325 mg via ORAL
  Filled 2023-11-22 (×21): qty 1

## 2023-11-22 MED ORDER — IPRATROPIUM-ALBUTEROL 0.5-2.5 (3) MG/3ML IN SOLN
6.0000 mL | Freq: Once | RESPIRATORY_TRACT | Status: AC
  Administered 2023-11-22: 6 mL via RESPIRATORY_TRACT
  Filled 2023-11-22: qty 3

## 2023-11-22 MED ORDER — DM-GUAIFENESIN ER 30-600 MG PO TB12
1.0000 | ORAL_TABLET | Freq: Two times a day (BID) | ORAL | Status: DC | PRN
  Administered 2023-11-23 – 2023-11-27 (×2): 1 via ORAL
  Filled 2023-11-22 (×2): qty 1

## 2023-11-22 MED ORDER — SODIUM BICARBONATE 650 MG PO TABS
650.0000 mg | ORAL_TABLET | Freq: Three times a day (TID) | ORAL | Status: DC
  Administered 2023-11-23 – 2023-12-17 (×71): 650 mg via ORAL
  Filled 2023-11-22 (×74): qty 1

## 2023-11-22 NOTE — ED Triage Notes (Signed)
 Pt to ED via [facility] from residence. Pt reports was living with a friend and he called the cops and EMS brought him in. Pt reports has no where to go and needs placement and just needs to talk to SW. Pt has no complaints. Pt is A&Ox4.

## 2023-11-22 NOTE — H&P (Signed)
 History and Physical    Larry Callahan JXB:[id number] DOB: [birth year] DOA: 11/22/2023  Referring MD/NP/PA:   PCP: Laurence Pons, NP   Patient coming from:  The patient is coming from home.     Chief Complaint: wheezing cough and SOB  HPI: Larry Callahan is a 68 y.o. 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  Patient was recently hospitalized from 5/24 - 6/2 due to CHF exacerbation and worsening renal function.  Patient was treated with diuresis.  Eliquis  was discontinued at discharge for unclear reason.  Patient states that he continues to have cough, wheezing and SOB, feeling very weak. No chest pain, fever or chills.  No nausea, vomiting, diarrhea or abdominal pain.  No symptoms of UTI.  Data reviewed independently and ED Course: pt was found to have WBC 4.3, BNP 393, worsening renal function with creatinine 4.72, BUN 88 and GFR 13 (baseline creatinine 3-4 recently), temperature normal, Bp pressure 123/78, heart rate 72, RR 20, oxygen  saturation 98% on room air.  Chest x-ray negative for infiltration.  Patient is placed in telemetry bed for observation.   EKG: I have personally reviewed.  Seem to be junctional rhythm, QTc 419, low voltage, anteroseptal infarction pattern.   Review of Systems:   General: no fevers, chills, no body weight gain, has poor appetite, has fatigue HEENT: no blurry vision, hearing changes or sore throat Respiratory: has dyspnea, coughing, wheezing CV: no chest pain, no palpitations GI: no nausea, vomiting, abdominal pain, diarrhea, constipation GU: no dysuria, burning on urination, increased urinary frequency, hematuria  Ext: no leg edema Neuro: no unilateral weakness,  numbness, or tingling, no vision change or hearing loss Skin: no rash, no skin tear. MSK: No muscle spasm, no deformity, no limitation of range of movement in spin Heme: No easy bruising.  Travel history: No recent long distant travel.   Allergy:  Allergies  Allergen Reactions   Penicillins Rash, Dermatitis, Hives and Other (See Comments)    Other reaction(s): HIVES   Codeine  Nausea And Vomiting    Past Medical History:  Diagnosis Date   Allergy    Atrial flutter (HCC)    a. Dx 12/2020--CHA2DS2VASc = 5-->eliquis .   Chronic combined systolic (congestive) and diastolic (congestive) heart failure (HCC)    a. 04/2019 Echo: EF 45-50%; b. 02/2019 Echo: EF 55-60%; c. 09/2020 Echo: EF 35-40%, glob HK. Nl RV size/fxn. Mild MR; d. 07/2021 Echo: EF 40-45%, mild LVH, low-nl RV fxn, no MR.   CKD (chronic kidney disease), stage III (HCC)    COPD (chronic obstructive pulmonary disease) (HCC)    Coronary artery disease    a. 2011 Cath: nonobs dzs; b. 09/2020 MV: EF 38%. No signif ischemia. ? inf MI vs diaph attenuation. GI uptake artifact.   Diabetes mellitus without complication (HCC)    GERD (gastroesophageal reflux disease)    History of CVA (cerebrovascular accident) 09/25/2020   Hyperlipidemia    Hypertension    Morbid (severe) obesity due to excess calories (HCC) 06/09/2017   Morbid obesity (HCC)    NICM (nonischemic cardiomyopathy) (HCC)    a. 04/2019 Echo: EF 45-50%; b. 02/2019 Echo: EF 55-60%; c. 09/2020 Echo: EF 35-40%, glob HK; d. 09/2020 MV: No ischemia. ? inf infarct vs attenuation; d. 07/2021 Echo: EF 40-45%.  OSA (obstructive sleep apnea)    Pneumonia due to COVID-19 virus 01/30/2020   Stage 3b chronic kidney disease (HCC)    Stroke (HCC) 2024   Syncope and collapse 11/16/2020    Past Surgical History:  Procedure Laterality Date   CARDIAC CATHETERIZATION     CHOLECYSTECTOMY     COLONOSCOPY WITH PROPOFOL  N/A 04/12/2019   Procedure: COLONOSCOPY WITH PROPOFOL ;  Surgeon: Selena Daily, MD;  Location: [name] ENDOSCOPY;  Service: Gastroenterology;  Laterality: N/A;   ESOPHAGOGASTRODUODENOSCOPY N/A 04/12/2019   Procedure: ESOPHAGOGASTRODUODENOSCOPY (EGD);  Surgeon: Selena Daily, MD;  Location: [facility] ENDOSCOPY;  Service: Gastroenterology;  Laterality: N/A;   ESOPHAGOGASTRODUODENOSCOPY (EGD) WITH PROPOFOL  N/A 11/21/2019   Procedure: ESOPHAGOGASTRODUODENOSCOPY (EGD) WITH PROPOFOL ;  Surgeon: Selena Daily, MD;  Location: [facility] ENDOSCOPY;  Service: Gastroenterology;  Laterality: N/A;   ESOPHAGOGASTRODUODENOSCOPY (EGD) WITH PROPOFOL  N/A 01/11/2022   Procedure: ESOPHAGOGASTRODUODENOSCOPY (EGD) WITH PROPOFOL ;  Surgeon: Marnee Sink, MD;  Location: [name] ENDOSCOPY;  Service: Endoscopy;  Laterality: N/A;   ESOPHAGOGASTRODUODENOSCOPY (EGD) WITH PROPOFOL  N/A 08/02/2022   Procedure: ESOPHAGOGASTRODUODENOSCOPY (EGD) WITH PROPOFOL ;  Surgeon: Marnee Sink, MD;  Location: [name] ENDOSCOPY;  Service: Endoscopy;  Laterality: N/A;   FLEXIBLE BRONCHOSCOPY Bilateral 05/17/2019   Procedure: FLEXIBLE BRONCHOSCOPY;  Surgeon: Cordie Deters, MD;  Location: [name] ORS;  Service: Pulmonary;  Laterality: Bilateral;   left arm surgery     nephrectomy Left    PARATHYROIDECTOMY     RIGHT HEART CATH N/A 03/29/2019   Procedure: RIGHT HEART CATH;  Surgeon: Devorah Fonder, MD;  Location: [name] INVASIVE CV LAB;  Service: Cardiovascular;  Laterality: N/A;    Social History:  reports that he has been smoking cigarettes. He has quit using smokeless tobacco.  His smokeless tobacco use included chew. He reports that he does not drink alcohol and does not use drugs.  Family History:  Family History  Problem Relation Age of Onset   Diabetes Mother        2003   Lung cancer Father    Diabetes Sister    Hypertension Sister    Heart disease Sister    Cancer Sister    Bone cancer Brother    Prostate cancer Neg Hx    Bladder Cancer Neg Hx    Kidney cancer Neg Hx      Prior to Admission  medications   Medication Sig Start Date End Date Taking? Authorizing Provider  ACCU-CHEK GUIDE test strip Use to check blood sugars 4 times a day E11.65 03/08/23   Laurence Pons, NP  Accu-Chek Softclix Lancets lancets Use to check blood sugars 4 times a day   E11.65 08/20/21   Laurence Pons, NP  acetaminophen  (TYLENOL ) 325 MG tablet Take 2 tablets (650 mg total) by mouth every 6 (six) hours as needed for mild pain or moderate pain (or Fever >/= 101). 08/03/22   Althia Atlas, MD  albuterol  (VENTOLIN  HFA) 108 [phone] Base) MCG/ACT inhaler Inhale 1-2 puffs into the lungs every 4 (four) hours as needed for wheezing or shortness of breath. 10/12/23   Alexander, Natalie, DO  amLODipine  (NORVASC ) 5 MG tablet Take 1 tablet (5 mg total) by mouth 2 (two) times daily. Skip the dose if systolic BP less than 140 mmHg 10/30/23   Abernathy, Janeice Medal, NP  apixaban  (ELIQUIS ) 5 MG TABS tablet Take 1 tablet (5 mg total) by mouth 2 (two) times daily. 10/05/23   Laurence Pons, NP  Blood Glucose Monitoring Suppl (TRUE METRIX AIR GLUCOSE METER) DEVI 1 Device by Does  not apply route 2 (two) times daily. 04/13/18   Boscia, Heather E, NP  budeson-glycopyrrolate -formoterol  (BREZTRI  AEROSPHERE) 160-9-4.8 MCG/ACT AERO inhaler Inhale 2 puffs into the lungs 2 (two) times daily. 10/05/23   Abernathy, Alyssa, NP  carvedilol  (COREG ) 12.5 MG tablet Take 1 tablet (12.5 mg total) by mouth 2 (two) times daily. 10/30/23   Laurence Pons, NP  cyanocobalamin  1000 MCG tablet Take 1 tablet (1,000 mcg total) by mouth daily. 10/05/23   Abernathy, Alyssa, NP  diphenhydrAMINE  (BENADRYL ) 25 mg capsule Take 1 capsule (25 mg total) by mouth every 8 (eight) hours as needed for itching. 10/12/23   Alexander, Natalie, DO  ferrous sulfate  325 (65 FE) MG tablet Take 325 mg by mouth every evening.    [provider]  furosemide  (LASIX ) 40 MG tablet Take 1 tablet (40 mg total) by mouth daily. 10/05/23   Abernathy, Alyssa, NP  hydrALAZINE  (APRESOLINE )  100 MG tablet Take 1 tablet (100 mg total) by mouth 3 (three) times daily as needed (SBP >160). 10/17/23 01/15/24  Althia Atlas, MD  insulin  glargine, 2 Unit Dial , (TOUJEO  MAX SOLOSTAR) 300 UNIT/ML Solostar Pen Inject 10 Units into the skin at bedtime. 10/30/23   Laurence Pons, NP  Insulin  lispro (HUMALOG  JUNIOR KWIKPEN) 100 UNIT/ML Inject 0-10 Units into the skin 3 (three) times daily.    [provider]  Insulin  Pen Needle (NOVOFINE PEN NEEDLE) 32G X 6 MM MISC Use as directed with toujeo  pen 09/08/23   Laurence Pons, NP  ipratropium-albuterol  (DUONEB) 0.5-2.5 (3) MG/3ML SOLN Take 3 mLs by nebulization every 6 (six) hours as needed. 10/05/23   Abernathy, Alyssa, NP  methocarbamol  (ROBAXIN ) 750 MG tablet Take 1 tablet (750 mg total) by mouth every 6 (six) hours as needed for muscle spasms. 10/05/23   Abernathy, Alyssa, NP  metolazone  (ZAROXOLYN ) 2.5 MG tablet Take once weekly on Tuesdays 11/13/23   Dezii, Alexandra, DO  pantoprazole  (PROTONIX ) 40 MG tablet Take 1 tablet (40 mg total) by mouth daily. 11/13/23 12/13/23  Dezii, Alexandra, DO  potassium chloride  SA (KLOR-CON  M) 20 MEQ tablet Take 1 tablet (20 mEq total) by mouth daily. 10/17/23   Althia Atlas, MD  rosuvastatin  (CRESTOR ) 10 MG tablet Take 1 tablet (10 mg total) by mouth at bedtime. 09/18/23   Laurence Pons, NP  Skin Protectants, Misc. (PERIGUARD EX) Apply 1 Application topically in the morning, at noon, and at bedtime.    [provider]  sodium bicarbonate  650 MG tablet Take 2 tablets (1,300 mg total) by mouth 3 (three) times daily for 14 days, THEN 1 tablet (650 mg total) 3 (three) times daily. 10/17/23 01/29/24  Althia Atlas, MD    Physical Exam: Vitals:   11/22/23 1434 11/22/23 2046 11/22/23 2101  BP: 123/78 136/64   Pulse: 72 76   Resp: 20 20   Temp: 98.7 F (37.1 C) 97.6 F (36.4 C)   TempSrc: Oral Oral   SpO2: 98% 94% 96%   General: Not in acute distress HEENT:       Eyes: PERRL, EOMI, no jaundice        ENT: No discharge from the ears and nose, no pharynx injection, no tonsillar enlargement.        Neck: No JVD, no bruit, no mass felt. Heme: No neck lymph node enlargement. Cardiac: S1/S2, RRR, No murmurs, No gallops or rubs. Respiratory: Has wheezing bilaterally GI: Soft, nondistended, nontender, no rebound pain, no organomegaly, BS present. GU: No hematuria Ext: No pitting leg edema bilaterally. 1+DP/PT pulse  bilaterally. Musculoskeletal: No joint deformities, No joint redness or warmth, no limitation of ROM in spin. Skin: No rashes.  Neuro: Alert, oriented X3, cranial nerves II-XII grossly intact, moves all extremities normally.  Psych: Patient is not psychotic, no suicidal or hemocidal ideation.  Labs on Admission: I have personally reviewed following labs and imaging studies  CBC: Recent Labs  Lab 11/22/23 1447  WBC 4.3  HGB 10.1*  HCT 31.5*  MCV 90.8  PLT 234   Basic Metabolic Panel: Recent Labs  Lab 11/22/23 1447  NA 140  K 5.1  CL 112*  CO2 18*  GLUCOSE 130*  BUN 88*  CREATININE 4.72*  CALCIUM  8.3*   GFR: Estimated Creatinine Clearance: 16.8 mL/min (A) (by C-G formula based on SCr of 4.72 mg/dL (H)). Liver Function Tests: Recent Labs  Lab 11/22/23 1447  AST 16  ALT 11  ALKPHOS 72  BILITOT 0.8  PROT 7.2  ALBUMIN  2.7*   No results for input(s): LIPASE, AMYLASE in the last 168 hours. No results for input(s): AMMONIA in the last 168 hours. Coagulation Profile: No results for input(s): INR, PROTIME in the last 168 hours. Cardiac Enzymes: No results for input(s): CKTOTAL, CKMB, CKMBINDEX, TROPONINI in the last 168 hours. BNP (last 3 results) No results for input(s): PROBNP in the last 8760 hours. HbA1C: No results for input(s): HGBA1C in the last 72 hours. CBG: Recent Labs  Lab 11/18/23 1153 11/18/23 1605 11/18/23 2228 11/19/23 0857 11/22/23 2050  GLUCAP 128* 133* 87 153* 82   Lipid Profile: No results for input(s):  CHOL, HDL, LDLCALC, TRIG, CHOLHDL, LDLDIRECT in the last 72 hours. Thyroid  Function Tests: No results for input(s): TSH, T4TOTAL, FREET4, T3FREE, THYROIDAB in the last 72 hours. Anemia Panel: No results for input(s): VITAMINB12, FOLATE, FERRITIN, TIBC, IRON , RETICCTPCT in the last 72 hours. Urine analysis:    Component Value Date/Time   COLORURINE STRAW (A) 09/17/2022 1432   APPEARANCEUR CLEAR (A) 09/17/2022 1432   APPEARANCEUR Clear 03/29/2022 1504   LABSPEC 1.009 09/17/2022 1432   LABSPEC 1.005 03/06/2013 0929   PHURINE 5.0 09/17/2022 1432   GLUCOSEU NEGATIVE 09/17/2022 1432   GLUCOSEU Negative 03/06/2013 0929   HGBUR NEGATIVE 09/17/2022 1432   BILIRUBINUR NEGATIVE 09/17/2022 1432   BILIRUBINUR Negative 03/29/2022 1504   BILIRUBINUR Negative 03/06/2013 0929   KETONESUR NEGATIVE 09/17/2022 1432   PROTEINUR 100 (A) 09/17/2022 1432   UROBILINOGEN 0.2 02/20/2019 1404   NITRITE NEGATIVE 09/17/2022 1432   LEUKOCYTESUR NEGATIVE 09/17/2022 1432   LEUKOCYTESUR Negative 03/06/2013 0929   Sepsis Labs: @LABRCNTIP (procalcitonin:4,lacticidven:4) ) Recent Results (from the past 240 hours)  Resp panel by RT-PCR (RSV, Flu A&B, Covid) Anterior Nasal Swab     Status: None   Collection Time: 11/22/23  8:26 PM   Specimen: Anterior Nasal Swab  Result Value Ref Range Status   SARS Coronavirus 2 by RT PCR NEGATIVE NEGATIVE Final    Comment: (NOTE) SARS-CoV-2 target nucleic acids are NOT DETECTED.  The SARS-CoV-2 RNA is generally detectable in upper respiratory specimens during the acute phase of infection. The lowest concentration of SARS-CoV-2 viral copies this assay can detect is 138 copies/mL. A negative result does not preclude SARS-Cov-2 infection and should not be used as the sole basis for treatment or other patient management decisions. A negative result may occur with  improper specimen collection/handling, submission of specimen other than  nasopharyngeal swab, presence of viral mutation(s) within the areas targeted by this assay, and inadequate number of viral copies(<138 copies/mL). A negative result  must be combined with clinical observations, patient history, and epidemiological information. The expected result is Negative.  Fact Sheet for Patients:  BloggerCourse.com  Fact Sheet for Healthcare Providers:  SeriousBroker.it  This test is no t yet approved or cleared by the [place]  FDA and  has been authorized for detection and/or diagnosis of SARS-CoV-2 by FDA under an Emergency Use Authorization (EUA). This EUA will remain  in effect (meaning this test can be used) for the duration of the COVID-19 declaration under Section 564(b)(1) of the Act, 21 U.S.C.section 360bbb-3(b)(1), unless the authorization is terminated  or revoked sooner.       Influenza A by PCR NEGATIVE NEGATIVE Final   Influenza B by PCR NEGATIVE NEGATIVE Final    Comment: (NOTE) The Xpert Xpress SARS-CoV-2/FLU/RSV plus assay is intended as an aid in the diagnosis of influenza from Nasopharyngeal swab specimens and should not be used as a sole basis for treatment. Nasal washings and aspirates are unacceptable for Xpert Xpress SARS-CoV-2/FLU/RSV testing.  Fact Sheet for Patients: BloggerCourse.com  Fact Sheet for Healthcare Providers: SeriousBroker.it  This test is not yet approved or cleared by the [place]  FDA and has been authorized for detection and/or diagnosis of SARS-CoV-2 by FDA under an Emergency Use Authorization (EUA). This EUA will remain in effect (meaning this test can be used) for the duration of the COVID-19 declaration under Section 564(b)(1) of the Act, 21 U.S.C. section 360bbb-3(b)(1), unless the authorization is terminated or revoked.     Resp Syncytial Virus by PCR NEGATIVE NEGATIVE Final    Comment:  (NOTE) Fact Sheet for Patients: BloggerCourse.com  Fact Sheet for Healthcare Providers: SeriousBroker.it  This test is not yet approved or cleared by the [place]  FDA and has been authorized for detection and/or diagnosis of SARS-CoV-2 by FDA under an Emergency Use Authorization (EUA). This EUA will remain in effect (meaning this test can be used) for the duration of the COVID-19 declaration under Section 564(b)(1) of the Act, 21 U.S.C. section 360bbb-3(b)(1), unless the authorization is terminated or revoked.  Performed at [facility], [address] [place], [place] [address]      Radiological Exams on Admission:   Assessment/Plan Principal Problem:   COPD exacerbation (HCC) Active Problems:   Chronic diastolic CHF (congestive heart failure) (HCC)   Essential hypertension   Atrial flutter (HCC)   Type II diabetes mellitus with renal manifestations (HCC)   CKD (chronic kidney disease) stage 4, GFR 15-29 ml/min (HCC)   HLD (hyperlipidemia)   CAD (coronary artery disease)   Metabolic acidosis, normal anion gap (NAG)   Stroke (HCC)   Iron  deficiency anemia   Assessment and Plan:  COPD exacerbation (HCC): No oxygen  desaturation.  Chest x-ray negative for infiltration.  Has negative PCR for COVID, flu and RSV  -Place in tele bed for obs -Bronchodilators and prn Mucinex  -Solu-Medrol  80 mg IV daily after given 125 mg of Solu-Medrol  -Incentive spirometry -Follow up sputum culture  Chronic diastolic CHF (congestive heart failure) (HCC): 2D echo on 11/05/2023 showed EF of 50-55% with grade 1 diastolic dysfunction.  Patient has had elevated BNP 393, but no any leg edema.  No JVD.  Does not seem to have CHF exacerbation.  Elevated BNP is likely due to CKD 4. -Hold Lasix  and metolazone  due to worsening renal function  Essential hypertension -IV hydralazine  as needed - Coreg , amlodipine   Atrial flutter (HCC):  Heart rate 72, s/p of ablation at [facility] 2024.  Eliquis  was discontinued at recent discharge -  Continue Coreg   Type II diabetes mellitus with renal manifestations [facility]): Recent A1c 6.7, well-controlled.  Patient is a taking NovoLog  endocrine insulin  10 units daily -Sliding scale insulin , glargine insulin  7 units daily  CKD (chronic kidney disease) stage 4, GFR 15-29 ml/min (HCC): Slightly worsening than baseline.  Recent baseline creatinine 3-4.  His creatinine is a 4.72, BUN 88, GFR 13. -Hold Lasix  and metolazone  temporarily  HLD (hyperlipidemia) -Crestor   CAD (coronary artery disease) -Crestor   Iron  deficiency anemia: Hemoglobin stable 10.1 (9.2 on 6//25) -Continue iron  supplement  Metabolic acidosis, normal anion gap (NAG): Bicarbonate 18. - Continue home sodium bicarbonate   History of stroke (HCC) -Crestor     Social situation: Patient states that he is currently living in his nephew home, but he said that he can no longer living is his nephew's home. Per EDP, there was an authorization process started for [facility], but patient was notified that he is out of SNF days and would self-pay to go there which would be $10,000 upfront.  In the setting of this patient was discharged home with home health care after discussion with his nephew.  -consult TOC for [facility] and possible rehab       DVT ppx: on Eliquis   Code Status: Full code  Family Communication:     not done, no family member is at bed side  Disposition Plan:  to be determined  Consults called:  none  Admission status and Level of care: Telemetry Medical:    for obs    Dispo: The patient is from: Home              Anticipated d/c is to: to be determined              Anticipated d/c date is: 1 day              Patient currently is not medically stable to d/c.    Severity of Illness:  The appropriate patient status for this patient is OBSERVATION. Observation status is judged to be reasonable and  necessary in order to provide the required intensity of service to ensure the patient's safety. The patient's presenting symptoms, physical exam findings, and initial radiographic and laboratory data in the context of their medical condition is felt to place them at decreased risk for further clinical deterioration. Furthermore, it is anticipated that the patient will be medically stable for discharge from the hospital within 2 midnights of admission.        Date of Service 11/22/2023    Cicily Bonano [facility]   If 7PM-7AM, please contact night-coverage [email or web address] 11/22/2023, 11:56 PM

## 2023-11-22 NOTE — ED Triage Notes (Signed)
 First Nurse Note:  Pt via [facility] from home. Pt here for placement. Denies complaints. Pt was just discharged and was having issues because his insurance would not cover LTC, pt was set up with home health. Pt is A&Ox4 and NAD

## 2023-11-22 NOTE — ED Provider Notes (Signed)
 [facility] Provider Note    Event Date/Time   First MD Initiated Contact with Patient 11/22/23 1630     (approximate)   History   Placement    HPI  Larry Callahan is a 68 year old male with history of CHF, a flutter on Eliquis , CKD, T2DM, CAD presenting to the ER for housing concerns. Reports his nephew told him that he can no longer live with him. Reports he has been taking his medications as directed but has felt wheezy over the past few days. Unsure if he feels fluid overloaded.   I reviewed his discharge summary from 11/13/2023.  At that time he was admitted for CHF exacerbation, also found to have AKI superimposed on CKD with initial creatinine of 5.2 in the setting of solitary kidney that improved with diuresis.   I reviewed his TOC progression note from 6/6.  At that time there was an authorization process started for [facility], but patient was notified that he is out of SNF days and would self-pay to go there which would be $10,000 upfront.  In the setting of this patient was discharged home with home health care after discussion with his nephew.       Physical Exam   Triage Vital Signs: ED Triage Vitals  Encounter Vitals Group     BP 11/22/23 1434 123/78     Systolic BP Percentile --      Diastolic BP Percentile --      Pulse Rate 11/22/23 1434 72     Resp 11/22/23 1434 20     Temp 11/22/23 1434 98.7 F (37.1 C)     Temp Source 11/22/23 1434 Oral     SpO2 11/22/23 1434 98 %     Weight --      Height --      Head Circumference --      Peak Flow --      Pain Score 11/22/23 1435 0     Pain Loc --      Pain Education --      Exclude from Growth Chart --     Most recent vital signs: Vitals:   11/22/23 2046 11/22/23 2101  BP: 136/64   Pulse: 76   Resp: 20   Temp: 97.6 F (36.4 C)   SpO2: 94% 96%     General: Awake, interactive  CV:  Regular rate, good peripheral perfusion.  Resp:  Tachypneic with labored  respirations and diffuse expiratory wheezing Abd:  Nondistended. Neuro:  Symmetric facial movement, fluid speech   ED Results / Procedures / Treatments   Labs (all labs ordered are listed, but only abnormal results are displayed) Labs Reviewed  CBC - Abnormal; Notable for the following components:      Result Value   RBC 3.47 (*)    Hemoglobin 10.1 (*)    HCT 31.5 (*)    All other components within normal limits  COMPREHENSIVE METABOLIC PANEL WITH GFR - Abnormal; Notable for the following components:   Chloride 112 (*)    CO2 18 (*)    Glucose, Bld 130 (*)    BUN 88 (*)    Creatinine, Ser 4.72 (*)    Calcium  8.3 (*)    Albumin  2.7 (*)    GFR, Estimated 13 (*)    All other components within normal limits  BRAIN NATRIURETIC PEPTIDE - Abnormal; Notable for the following components:   B Natriuretic Peptide 393.1 (*)    All other components  within normal limits  RESP PANEL BY RT-PCR (RSV, FLU A&B, COVID)  RVPGX2  EXPECTORATED SPUTUM ASSESSMENT W GRAM STAIN, RFLX TO RESP C  GLUCOSE, CAPILLARY  BASIC METABOLIC PANEL WITH GFR  CBC     EKG EKG independently reviewed and interpreted by myself demonstrates:  EKG demonstrates sinus rhythm at a rate of 75, PR 198, QRS 84, QTc 419   RADIOLOGY Imaging independently reviewed and interpreted by myself demonstrates:  CXR without focal consolidation, does have bilateral effusions  Formal Radiology Read:  DG Chest 2 View Result Date: 11/22/2023 CLINICAL DATA:  Shortness of breath. EXAM: CHEST - 2 VIEW COMPARISON:  Nov 07, 2023. FINDINGS: Stable cardiomediastinal silhouette. Mild bibasilar subsegmental atelectasis is noted with small pleural effusions. Bony thorax is unremarkable. IMPRESSION: Mild bibasilar subsegmental atelectasis with small pleural effusions. Electronically Signed   By: Rosalene Colon M.D.   On: 11/22/2023 17:40    PROCEDURES:  Critical Care performed: No  Procedures   MEDICATIONS ORDERED IN ED: Medications   methylPREDNISolone  sodium succinate (SOLU-MEDROL ) 125 mg/2 mL injection 80 mg (has no administration in time range)  albuterol  (PROVENTIL ) (2.5 MG/3ML) 0.083% nebulizer solution 2.5 mg (has no administration in time range)  dextromethorphan -guaiFENesin  (MUCINEX  DM) 30-600 MG per 12 hr tablet 1 tablet (has no administration in time range)  ondansetron  (ZOFRAN ) injection 4 mg (has no administration in time range)  hydrALAZINE  (APRESOLINE ) injection 5 mg (has no administration in time range)  acetaminophen  (TYLENOL ) tablet 650 mg (has no administration in time range)  nicotine  (NICODERM CQ  - dosed in mg/24 hours) patch 21 mg (has no administration in time range)  insulin  aspart (novoLOG ) injection 0-9 Units (has no administration in time range)  insulin  aspart (novoLOG ) injection 0-5 Units (has no administration in time range)  heparin  injection 5,000 Units (has no administration in time range)  ipratropium-albuterol  (DUONEB) 0.5-2.5 (3) MG/3ML nebulizer solution 3 mL (has no administration in time range)  ipratropium-albuterol  (DUONEB) 0.5-2.5 (3) MG/3ML nebulizer solution 6 mL (6 mLs Nebulization Given 11/22/23 1901)  methylPREDNISolone  sodium succinate (SOLU-MEDROL ) 125 mg/2 mL injection 125 mg (125 mg Intravenous Given 11/22/23 1944)     IMPRESSION / MDM / ASSESSMENT AND PLAN / ED COURSE  I reviewed the triage vital signs and the nursing notes.  Differential diagnosis includes, but is not limited to, COPD exacerbation, CHF exacerbation, pneumonia, anemia, electrolyte abnormality, debility from multiple comorbidities without acute pathology  Patient's presentation is most consistent with acute presentation with potential threat to life or bodily function.  68 year old male presenting to the ER for evaluation of unstable housing, found to be short of breath on exam.  Labs from triage with stable anemia, worsening AKI on CKD with creatinine of 4.72, up from 3.86 at time of discharge 1 week ago.   Potassium at upper limit of normal at 5.1.  Will obtain chest x-Easter Schinke and add on BNP to evaluate further for fluid status and possible pneumonia.  Does have diffuse wheezing, will treat with nebs and steroids.  Clinical Course as of 11/22/23 2341  Wed Nov 22, 2023  4098 Patient reassessed.  Remains tachypneic with expiratory wheeze.  Suspect likely COPD exacerbation, possible component of CHF exacerbation as he does have an elevated BNP as well.  Given his persistent respiratory symptoms as well as his AKI on CKD, do think admission is reasonable.  Will reach out to hospitalist team. [NR]  1948 Case discussed with Dr. Rosalea Collin. He will evaluated for anticipated admission.  [NR]  Clinical Course User Index [NR] Claria Crofts, MD     FINAL CLINICAL IMPRESSION(S) / ED DIAGNOSES   Final diagnoses:  COPD exacerbation (HCC)  Acute kidney injury (HCC)     Rx / DC Orders   ED Discharge Orders     None        Note:  This document was prepared using Dragon voice recognition software and may include unintentional dictation errors.   Claria Crofts, MD 11/22/23 [phone]

## 2023-11-23 DIAGNOSIS — I4892 Unspecified atrial flutter: Secondary | ICD-10-CM | POA: Diagnosis not present

## 2023-11-23 DIAGNOSIS — I5032 Chronic diastolic (congestive) heart failure: Secondary | ICD-10-CM | POA: Diagnosis not present

## 2023-11-23 DIAGNOSIS — D509 Iron deficiency anemia, unspecified: Secondary | ICD-10-CM | POA: Diagnosis not present

## 2023-11-23 DIAGNOSIS — J441 Chronic obstructive pulmonary disease with (acute) exacerbation: Secondary | ICD-10-CM | POA: Diagnosis not present

## 2023-11-23 LAB — TECHNOLOGIST SMEAR REVIEW

## 2023-11-23 LAB — BASIC METABOLIC PANEL WITH GFR
Anion gap: 10 (ref 5–15)
BUN: 92 mg/dL — ABNORMAL HIGH (ref 8–23)
CO2: 17 mmol/L — ABNORMAL LOW (ref 22–32)
Calcium: 7.8 mg/dL — ABNORMAL LOW (ref 8.9–10.3)
Chloride: 106 mmol/L (ref 98–111)
Creatinine, Ser: 4.74 mg/dL — ABNORMAL HIGH (ref 0.61–1.24)
GFR, Estimated: 13 mL/min — ABNORMAL LOW (ref >60)
Glucose, Bld: 429 mg/dL — ABNORMAL HIGH (ref 70–99)
Potassium: 5.7 mmol/L — ABNORMAL HIGH (ref 3.5–5.1)
Sodium: 133 mmol/L — ABNORMAL LOW (ref 135–145)

## 2023-11-23 LAB — CBC
HCT: 28.7 % — ABNORMAL LOW (ref 39.0–52.0)
Hemoglobin: 9.1 g/dL — ABNORMAL LOW (ref 13.0–17.0)
MCH: 29.1 pg (ref 26.0–34.0)
MCHC: 31.7 g/dL (ref 30.0–36.0)
MCV: 91.7 fL (ref 80.0–100.0)
Platelets: 184 10*3/uL (ref 150–400)
RBC: 3.13 MIL/uL — ABNORMAL LOW (ref 4.22–5.81)
RDW: 13.6 % (ref 11.5–15.5)
WBC: 2 10*3/uL — ABNORMAL LOW (ref 4.0–10.5)
nRBC: 0 % (ref 0.0–0.2)

## 2023-11-23 LAB — GLUCOSE, CAPILLARY
Glucose-Capillary: 386 mg/dL — ABNORMAL HIGH (ref 70–99)
Glucose-Capillary: 368 mg/dL — ABNORMAL HIGH (ref 70–99)
Glucose-Capillary: 320 mg/dL — ABNORMAL HIGH (ref 70–99)
Glucose-Capillary: 309 mg/dL — ABNORMAL HIGH (ref 70–99)

## 2023-11-23 MED ORDER — SODIUM ZIRCONIUM CYCLOSILICATE 10 G PO PACK
10.0000 g | PACK | Freq: Once | ORAL | Status: AC
  Administered 2023-11-23: 10 g via ORAL
  Filled 2023-11-23: qty 1

## 2023-11-23 MED ORDER — APIXABAN 5 MG PO TABS
5.0000 mg | ORAL_TABLET | Freq: Two times a day (BID) | ORAL | Status: DC
  Administered 2023-11-23 – 2023-12-10 (×36): 5 mg via ORAL
  Filled 2023-11-23 (×36): qty 1

## 2023-11-23 MED ORDER — PREDNISONE 20 MG PO TABS
40.0000 mg | ORAL_TABLET | Freq: Every day | ORAL | Status: AC
  Administered 2023-11-24 – 2023-11-27 (×4): 40 mg via ORAL
  Filled 2023-11-23 (×4): qty 2

## 2023-11-23 MED ORDER — BUDESON-GLYCOPYRROL-FORMOTEROL 160-9-4.8 MCG/ACT IN AERO
2.0000 | INHALATION_SPRAY | Freq: Two times a day (BID) | RESPIRATORY_TRACT | Status: DC
  Administered 2023-11-24 – 2023-12-16 (×6): 2 via RESPIRATORY_TRACT
  Filled 2023-11-23: qty 5.9

## 2023-11-23 MED ORDER — INSULIN GLARGINE-YFGN 100 UNIT/ML ~~LOC~~ SOLN
10.0000 [IU] | Freq: Every day | SUBCUTANEOUS | Status: DC
  Administered 2023-11-23: 10 [IU] via SUBCUTANEOUS
  Filled 2023-11-23 (×2): qty 0.1

## 2023-11-23 NOTE — Plan of Care (Signed)
  Problem: Education: Goal: Ability to describe self-care measures that may prevent or decrease complications (Diabetes Survival Skills Education) will improve Outcome: Progressing Goal: Individualized Educational Video(s) Outcome: Progressing   Problem: Coping: Goal: Ability to adjust to condition or change in health will improve Outcome: Progressing   Problem: Fluid Volume: Goal: Ability to maintain a balanced intake and output will improve Outcome: Progressing   Problem: Health Behavior/Discharge Planning: Goal: Ability to identify and utilize available resources and services will improve Outcome: Progressing Goal: Ability to manage health-related needs will improve Outcome: Progressing   Problem: Metabolic: Goal: Ability to maintain appropriate glucose levels will improve Outcome: Progressing   Problem: Nutritional: Goal: Maintenance of adequate nutrition will improve Outcome: Progressing Goal: Progress toward achieving an optimal weight will improve Outcome: Progressing   Problem: Skin Integrity: Goal: Risk for impaired skin integrity will decrease Outcome: Progressing   Problem: Education: Goal: Knowledge of General Education information will improve Description: Including pain rating scale, medication(s)/side effects and non-pharmacologic comfort measures Outcome: Progressing   Problem: Health Behavior/Discharge Planning: Goal: Ability to manage health-related needs will improve Outcome: Progressing

## 2023-11-23 NOTE — Progress Notes (Signed)
 PT Cancellation Note  Patient Details Name: Larry Callahan MRN: [id number] DOB: 04/12/1956   Cancelled Treatment:    Reason Eval/Treat Not Completed: Other (comment). Orders received and chart reviewed. Upon entry pt politely declining PT. Pt tearful and emotional explaining his current situation and requesting to hold until tomorrow morning due to his emotions. Therapeutic listening provided respecting pt's wishes. Will re-attempt tomorrow.    Marc Senior. Fairly IV, PT, DPT Physical Therapist- [place]  [facility] 11/23/2023, 4:04 PM

## 2023-11-23 NOTE — Care Management Obs Status (Signed)
 MEDICARE OBSERVATION STATUS NOTIFICATION   Patient Details  Name: Emory Gallentine MRN: [id number] Date of Birth: 1956/03/21   Medicare Observation Status Notification Given:  (S) No (patient did not want a copy)    Anise Kerns 11/23/2023, 4:37 PM

## 2023-11-23 NOTE — Progress Notes (Signed)
 PROGRESS NOTE    Larry Callahan  ZOX:[id number] DOB: 03/05/1956 DOA: 11/22/2023 PCP: Laurence Pons, NP  Chief Complaint  Patient presents with   Placement     Hospital Course:  Larry Callahan is a 68 year old male with multiple medical problems including: heart failure with reduced EF 40% secondary to nonischemic cardiomyopathy, a flutter status post ablation on Eliquis , CKD stage IV, hypertension, type 2 diabetes, COPD, OSA, HLD, nonobstructive CAD, CVA.  Patient was recently admitted to the hospital and discharged on 6/2.  During that admission he was seen for COPD exacerbation and AKI superimposed on CKD.  He was discharged home in the custody of his nephew with whom he resides.  He then presented to the ED again on 6/4 requesting SNF placement.  He remained in the ED for 4 days pending SNF arrangements.  Ultimately SNF was found to be cost prohibitive and the patient discharged back home to his nephew on 6/8.  He presents again to the ED on this admission requesting long-term placement.  He reports he is no longer safe at home with his nephew but can also not afford skilled nursing facility placement.  ED physician had concerns of tachypnea with expiratory wheeze and requested admission for COPD exacerbation.   Subjective: On evaluation this morning patient reports he is medically stable.  Denies any acute complaints from a medical standpoint.  Reports he is purely here due to housing issues.  He reports he cannot safely return to his nephew   Objective: Vitals:   11/23/23 0437 11/23/23 0753 11/23/23 1155 11/23/23 1306  BP: 130/70 120/67 119/66   Pulse: 78 74 72   Resp:  16    Temp: 98.3 F (36.8 C) 98 F (36.7 C) 98.1 F (36.7 C)   TempSrc:      SpO2: 97% 100% 100% 99%    Intake/Output Summary (Last 24 hours) at 11/23/2023 1539 Last data filed at 11/23/2023 1446 Gross per 24 hour  Intake 480 ml  Output 500 ml  Net -20 ml   There were no vitals filed for  this visit.  Examination: General exam: Appears calm and comfortable, tearful Respiratory system: No work of breathing, symmetric chest wall expansion Cardiovascular system: S1 & S2 heard, RRR.  Gastrointestinal system: Abdomen is nondistended, soft and nontender.  Neuro: Alert and oriented. No focal neurological deficits. Extremities: Symmetric, expected ROM Skin: No rashes, lesions Psychiatry: Demonstrates appropriate judgement and insight. Mood & affect appropriate for situation.   Assessment & Plan:  Principal Problem:   COPD exacerbation (HCC) Active Problems:   Chronic diastolic CHF (congestive heart failure) (HCC)   Essential hypertension   Atrial flutter (HCC)   Type II diabetes mellitus with renal manifestations (HCC)   CKD (chronic kidney disease) stage 4, GFR 15-29 ml/min (HCC)   HLD (hyperlipidemia)   CAD (coronary artery disease)   Metabolic acidosis, normal anion gap (NAG)   Stroke (HCC)   Iron  deficiency anemia    Unstable housing - Patient reports SNF is cost prohibitive. - TOC consulted.  Patient reports he is not safe living with his nephew.  May require APS.  Defer this decision to social work.  Acute on chronic heart failure with reduced EF - November 2024: EF 40%, suspected secondary to a flutter - BNP 393, slightly above baseline. - No lower extremity edema on arrival.  Clinically euvolemic - Has had difficulty with diuresis in the past due to kidney function.  Given he is comfortable on room air at  this time we will hold off on initiating IV diuresis for now.    AKI superimposed on CKD stage IV Solitary kidney - Baseline creatinine near 3.5 - Acute worsening of creatinine this admission at 4.74 - Nephrology has been consulted in the past.  Patient is expected to require dialysis soon though not urgently, continues to have urine output - Will consult nephrology again during this admission  COPD, in exacerbation - Concerns of wheezing on arrival.   No wheezing today - Will complete short course prednisone .  Begin taper tomorrow - Remains on room air, follow respiratory status closely - Continue home dose nebulizers and additional albuterol  as needed - Encourage incentive spirometry  Leukopenia -- Chronic.  Last admission.  HIV negative - Will send peripheral smear review  Anemia of chronic disease - hgb currently stable - Continue to trend CBC - No active signs of bleeding    A flutter - Status post ablation [facility] in December 2024 - Patient reports he was not taking Eliquis  and thus was discharged at last hospital stay.  Refill history supports that he has been taking this medication.  Will resume it now.  Type 2 diabetes --Basal/bolus insulin .  Follow CBGs and titrate daily   Hypertension - Continue current medications  History of CVA - Resume home meds  DVT prophylaxis: Eliquis    Code Status: Full Code Disposition: Will require long-term care.  TOC consulted  Consultants:  Nephrology  Procedures:    Antimicrobials:  Anti-infectives (From admission, onward)    None       Data Reviewed: I have personally reviewed following labs and imaging studies CBC: Recent Labs  Lab 11/22/23 1447 11/23/23 0514  WBC 4.3 2.0*  HGB 10.1* 9.1*  HCT 31.5* 28.7*  MCV 90.8 91.7  PLT 234 184   Basic Metabolic Panel: Recent Labs  Lab 11/22/23 1447 11/23/23 0514  NA 140 133*  K 5.1 5.7*  CL 112* 106  CO2 18* 17*  GLUCOSE 130* 429*  BUN 88* 92*  CREATININE 4.72* 4.74*  CALCIUM  8.3* 7.8*   GFR: Estimated Creatinine Clearance: 16.7 mL/min (A) (by C-G formula based on SCr of 4.74 mg/dL (H)). Liver Function Tests: Recent Labs  Lab 11/22/23 1447  AST 16  ALT 11  ALKPHOS 72  BILITOT 0.8  PROT 7.2  ALBUMIN  2.7*   CBG: Recent Labs  Lab 11/18/23 2228 11/19/23 0857 11/22/23 2050 11/23/23 0750 11/23/23 1152  GLUCAP 87 153* 82 386* 368*    Recent Results (from the past 240 hours)  Resp panel by RT-PCR  (RSV, Flu A&B, Covid) Anterior Nasal Swab     Status: None   Collection Time: 11/22/23  8:26 PM   Specimen: Anterior Nasal Swab  Result Value Ref Range Status   SARS Coronavirus 2 by RT PCR NEGATIVE NEGATIVE Final    Comment: (NOTE) SARS-CoV-2 target nucleic acids are NOT DETECTED.  The SARS-CoV-2 RNA is generally detectable in upper respiratory specimens during the acute phase of infection. The lowest concentration of SARS-CoV-2 viral copies this assay can detect is 138 copies/mL. A negative result does not preclude SARS-Cov-2 infection and should not be used as the sole basis for treatment or other patient management decisions. A negative result may occur with  improper specimen collection/handling, submission of specimen other than nasopharyngeal swab, presence of viral mutation(s) within the areas targeted by this assay, and inadequate number of viral copies(<138 copies/mL). A negative result must be combined with clinical observations, patient history, and epidemiological information. The  expected result is Negative.  Fact Sheet for Patients:  BloggerCourse.com  Fact Sheet for Healthcare Providers:  SeriousBroker.it  This test is no t yet approved or cleared by the [place]  FDA and  has been authorized for detection and/or diagnosis of SARS-CoV-2 by FDA under an Emergency Use Authorization (EUA). This EUA will remain  in effect (meaning this test can be used) for the duration of the COVID-19 declaration under Section 564(b)(1) of the Act, 21 U.S.C.section 360bbb-3(b)(1), unless the authorization is terminated  or revoked sooner.       Influenza A by PCR NEGATIVE NEGATIVE Final   Influenza B by PCR NEGATIVE NEGATIVE Final    Comment: (NOTE) The Xpert Xpress SARS-CoV-2/FLU/RSV plus assay is intended as an aid in the diagnosis of influenza from Nasopharyngeal swab specimens and should not be used as a sole basis for  treatment. Nasal washings and aspirates are unacceptable for Xpert Xpress SARS-CoV-2/FLU/RSV testing.  Fact Sheet for Patients: BloggerCourse.com  Fact Sheet for Healthcare Providers: SeriousBroker.it  This test is not yet approved or cleared by the [place]  FDA and has been authorized for detection and/or diagnosis of SARS-CoV-2 by FDA under an Emergency Use Authorization (EUA). This EUA will remain in effect (meaning this test can be used) for the duration of the COVID-19 declaration under Section 564(b)(1) of the Act, 21 U.S.C. section 360bbb-3(b)(1), unless the authorization is terminated or revoked.     Resp Syncytial Virus by PCR NEGATIVE NEGATIVE Final    Comment: (NOTE) Fact Sheet for Patients: BloggerCourse.com  Fact Sheet for Healthcare Providers: SeriousBroker.it  This test is not yet approved or cleared by the [place]  FDA and has been authorized for detection and/or diagnosis of SARS-CoV-2 by FDA under an Emergency Use Authorization (EUA). This EUA will remain in effect (meaning this test can be used) for the duration of the COVID-19 declaration under Section 564(b)(1) of the Act, 21 U.S.C. section 360bbb-3(b)(1), unless the authorization is terminated or revoked.  Performed at [facility], [address], [place], [place] [address]      Radiology Studies: DG Chest 2 View Result Date: 11/22/2023 CLINICAL DATA:  Shortness of breath. EXAM: CHEST - 2 VIEW COMPARISON:  Nov 07, 2023. FINDINGS: Stable cardiomediastinal silhouette. Mild bibasilar subsegmental atelectasis is noted with small pleural effusions. Bony thorax is unremarkable. IMPRESSION: Mild bibasilar subsegmental atelectasis with small pleural effusions. Electronically Signed   By: Rosalene Colon M.D.   On: 11/22/2023 17:40    Scheduled Meds:  amLODipine   5 mg Oral BID   apixaban   5  mg Oral BID   budesonide -glycopyrrolate -formoterol   2 puff Inhalation BID   carvedilol   12.5 mg Oral BID WC   cyanocobalamin   1,000 mcg Oral Daily   ferrous sulfate   325 mg Oral QPM   insulin  aspart  0-5 Units Subcutaneous QHS   insulin  aspart  0-9 Units Subcutaneous TID WC   insulin  glargine-yfgn  10 Units Subcutaneous Daily   ipratropium-albuterol   3 mL Nebulization TID   methylPREDNISolone  (SOLU-MEDROL ) injection  80 mg Intravenous Daily   nicotine   21 mg Transdermal Daily   pantoprazole   40 mg Oral Daily   rosuvastatin   10 mg Oral QHS   sodium bicarbonate   650 mg Oral TID   Continuous Infusions:   LOS: 0 days  MDM: Patient is high risk for one or more organ failure.  They necessitate ongoing hospitalization for continued IV therapies and subsequent lab monitoring. Total time spent interpreting labs and vitals, reviewing  the medical record, coordinating care amongst consultants and care team members, directly assessing and discussing care with the patient and/or family: 55 min  Naylene Foell, DO [facility]  To contact the attending physician between 7A-7P please use Epic Chat. To contact the covering physician during after hours 7P-7A, please review Amion.  11/23/2023, 3:39 PM   *This document has been created with the assistance of dictation software. Please excuse typographical errors. *

## 2023-11-23 NOTE — Inpatient Diabetes Management (Signed)
 Inpatient Diabetes Program Recommendations  AACE/ADA: New Consensus Statement on Inpatient Glycemic Control (2015)  Target Ranges:  Prepandial:   less than 140 mg/dL      Peak postprandial:   less than 180 mg/dL (1-2 hours)      Critically ill patients:  140 - 180 mg/dL    Latest Reference Range & Units 11/22/23 20:50 11/23/23 07:50  Glucose-Capillary 70 - 99 mg/dL 82 161 (H)   Review of Glycemic Control  Diabetes history: DM2 Outpatient Diabetes medications: Toujeo  10 units at bedtime, Humalog  0-10 units TID with meals Current orders for Inpatient glycemic control: Semglee  7 units at bedtime, Novolog  0-9 units TID with meals, Novolog  0-5 units at bedtime; Solumedrol 80 mg daily  Inpatient Diabetes Program Recommendations:    Insulin : CBG 386 mg/dl this morning. Please consider changing Semglee  to 7 units daily to start now.   Thanks, Beacher Limerick, RN, MSN, CDCES Diabetes Coordinator Inpatient Diabetes Program [phone] (Team Pager from 8am to 5pm)

## 2023-11-24 DIAGNOSIS — D509 Iron deficiency anemia, unspecified: Secondary | ICD-10-CM | POA: Diagnosis not present

## 2023-11-24 DIAGNOSIS — J441 Chronic obstructive pulmonary disease with (acute) exacerbation: Secondary | ICD-10-CM | POA: Diagnosis not present

## 2023-11-24 DIAGNOSIS — I5032 Chronic diastolic (congestive) heart failure: Secondary | ICD-10-CM | POA: Diagnosis not present

## 2023-11-24 DIAGNOSIS — I4892 Unspecified atrial flutter: Secondary | ICD-10-CM | POA: Diagnosis not present

## 2023-11-24 LAB — COMPREHENSIVE METABOLIC PANEL WITH GFR
ALT: 10 U/L (ref 0–44)
AST: 14 U/L — ABNORMAL LOW (ref 15–41)
Albumin: 2.2 g/dL — ABNORMAL LOW (ref 3.5–5.0)
Alkaline Phosphatase: 54 U/L (ref 38–126)
Anion gap: 10 (ref 5–15)
BUN: 99 mg/dL — ABNORMAL HIGH (ref 8–23)
CO2: 18 mmol/L — ABNORMAL LOW (ref 22–32)
Calcium: 8 mg/dL — ABNORMAL LOW (ref 8.9–10.3)
Chloride: 105 mmol/L (ref 98–111)
Creatinine, Ser: 4.73 mg/dL — ABNORMAL HIGH (ref 0.61–1.24)
GFR, Estimated: 13 mL/min — ABNORMAL LOW (ref >60)
Glucose, Bld: 317 mg/dL — ABNORMAL HIGH (ref 70–99)
Potassium: 4.9 mmol/L (ref 3.5–5.1)
Sodium: 133 mmol/L — ABNORMAL LOW (ref 135–145)
Total Bilirubin: 0.7 mg/dL (ref 0.0–1.2)
Total Protein: 5.8 g/dL — ABNORMAL LOW (ref 6.5–8.1)

## 2023-11-24 LAB — CBC WITH DIFFERENTIAL/PLATELET
Abs Immature Granulocytes: 0.05 10*3/uL (ref 0.00–0.07)
Basophils Absolute: 0 10*3/uL (ref 0.0–0.1)
Basophils Relative: 0 %
Eosinophils Absolute: 0 10*3/uL (ref 0.0–0.5)
Eosinophils Relative: 0 %
HCT: 25 % — ABNORMAL LOW (ref 39.0–52.0)
Hemoglobin: 8.1 g/dL — ABNORMAL LOW (ref 13.0–17.0)
Immature Granulocytes: 1 %
Lymphocytes Relative: 9 %
Lymphs Abs: 0.6 10*3/uL — ABNORMAL LOW (ref 0.7–4.0)
MCH: 28.9 pg (ref 26.0–34.0)
MCHC: 32.4 g/dL (ref 30.0–36.0)
MCV: 89.3 fL (ref 80.0–100.0)
Monocytes Absolute: 0.1 10*3/uL (ref 0.1–1.0)
Monocytes Relative: 2 %
Neutro Abs: 5.5 10*3/uL (ref 1.7–7.7)
Neutrophils Relative %: 88 %
Platelets: 182 10*3/uL (ref 150–400)
RBC: 2.8 MIL/uL — ABNORMAL LOW (ref 4.22–5.81)
RDW: 13 % (ref 11.5–15.5)
WBC: 6.3 10*3/uL (ref 4.0–10.5)
nRBC: 0 % (ref 0.0–0.2)

## 2023-11-24 LAB — GLUCOSE, CAPILLARY
Glucose-Capillary: 280 mg/dL — ABNORMAL HIGH (ref 70–99)
Glucose-Capillary: 321 mg/dL — ABNORMAL HIGH (ref 70–99)
Glucose-Capillary: 346 mg/dL — ABNORMAL HIGH (ref 70–99)
Glucose-Capillary: 273 mg/dL — ABNORMAL HIGH (ref 70–99)

## 2023-11-24 LAB — MAGNESIUM: Magnesium: 2.2 mg/dL (ref 1.7–2.4)

## 2023-11-24 LAB — PHOSPHORUS: Phosphorus: 5.6 mg/dL — ABNORMAL HIGH (ref 2.5–4.6)

## 2023-11-24 MED ORDER — INSULIN GLARGINE-YFGN 100 UNIT/ML ~~LOC~~ SOLN
15.0000 [IU] | Freq: Every day | SUBCUTANEOUS | Status: DC
  Administered 2023-11-24: 15 [IU] via SUBCUTANEOUS
  Filled 2023-11-24: qty 0.15

## 2023-11-24 MED ORDER — INSULIN ASPART 100 UNIT/ML IJ SOLN
0.0000 [IU] | Freq: Every day | INTRAMUSCULAR | Status: DC
  Administered 2023-11-24: 3 [IU] via SUBCUTANEOUS
  Administered 2023-11-25: 4 [IU] via SUBCUTANEOUS
  Administered 2023-11-26: 5 [IU] via SUBCUTANEOUS
  Administered 2023-11-27: 2 [IU] via SUBCUTANEOUS
  Administered 2023-11-28: 3 [IU] via SUBCUTANEOUS
  Filled 2023-11-24 (×5): qty 1

## 2023-11-24 MED ORDER — INSULIN ASPART 100 UNIT/ML IJ SOLN
0.0000 [IU] | Freq: Three times a day (TID) | INTRAMUSCULAR | Status: DC
  Administered 2023-11-25 (×2): 11 [IU] via SUBCUTANEOUS
  Administered 2023-11-25: 7 [IU] via SUBCUTANEOUS
  Administered 2023-11-26: 20 [IU] via SUBCUTANEOUS
  Administered 2023-11-26 (×2): 4 [IU] via SUBCUTANEOUS
  Administered 2023-11-27: 15 [IU] via SUBCUTANEOUS
  Administered 2023-11-27: 11 [IU] via SUBCUTANEOUS
  Administered 2023-11-28: 4 [IU] via SUBCUTANEOUS
  Administered 2023-11-28: 3 [IU] via SUBCUTANEOUS
  Administered 2023-11-28: 7 [IU] via SUBCUTANEOUS
  Administered 2023-11-29: 4 [IU] via SUBCUTANEOUS
  Administered 2023-11-30: 7 [IU] via SUBCUTANEOUS
  Administered 2023-11-30: 3 [IU] via SUBCUTANEOUS
  Administered 2023-12-01: 4 [IU] via SUBCUTANEOUS
  Administered 2023-12-01: 15 [IU] via SUBCUTANEOUS
  Administered 2023-12-02: 4 [IU] via SUBCUTANEOUS
  Filled 2023-11-24 (×18): qty 1

## 2023-11-24 MED ORDER — INSULIN GLARGINE-YFGN 100 UNIT/ML ~~LOC~~ SOLN
20.0000 [IU] | Freq: Every day | SUBCUTANEOUS | Status: DC
  Administered 2023-11-25 – 2023-11-26 (×2): 20 [IU] via SUBCUTANEOUS
  Filled 2023-11-24 (×3): qty 0.2

## 2023-11-24 NOTE — Inpatient Diabetes Management (Signed)
 Inpatient Diabetes Program Recommendations  AACE/ADA: New Consensus Statement on Inpatient Glycemic Control   Target Ranges:  Prepandial:   less than 140 mg/dL      Peak postprandial:   less than 180 mg/dL (1-2 hours)      Critically ill patients:  140 - 180 mg/dL    Latest Reference Range & Units 11/24/23 05:24  Glucose 70 - 99 mg/dL 403 (H)    Latest Reference Range & Units 11/23/23 07:50 11/23/23 11:52 11/23/23 16:02 11/23/23 21:31  Glucose-Capillary 70 - 99 mg/dL 474 (H) 259 (H) 563 (H) 309 (H)   Review of Glycemic Control  Diabetes history: DM2 Outpatient Diabetes medications: Toujeo  10 units at bedtime, Humalog  0-10 units TID with meals Current orders for Inpatient glycemic control: Semglee  10 units daily, Novolog  0-9 units TID with meals, Novolog  0-5 units at bedtime; Prednisone  40 mg daily  Inpatient Diabetes Program Recommendations:    Insulin : Noted Solumedrol changed to Prednisone . Lab glucose 317 mg/dl today.   If steroids are continued, please consider increasing Semglee  to 15 units daily and adding Novolog  4 units TID with meals for meal coverage if patient eats at least 50% of meals.  Thanks, Beacher Limerick, RN, MSN, CDCES Diabetes Coordinator Inpatient Diabetes Program [phone] (Team Pager from 8am to 5pm)

## 2023-11-24 NOTE — Progress Notes (Signed)
 Heart Failure Navigator Progress Note  Assessed for Heart & Vascular TOC clinic readiness.  Patient does not meet criteria due to current patient of Alyssa Abernathy (Dr. Meredeth Stallion). Patient had an appointment on  11/20/23 but he cancelled because his nephew refused to take him. Encouraged patient to call to reschedule when his housing situation is determined.   Navigator available for reassessment of patient.   Celedonio Coil, RN, BSN [facility] Heart Failure Navigator Secure Chat Only
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
## 2023-11-24 NOTE — Progress Notes (Signed)
 [facility]  ROUNDING NOTE   Subjective:   Patient well-known to us  from prior admission. Unable to take care of himself at home. Patient has had significantly fluctuating renal function recently.  Recent baseline appears to be creatinine of 3.25 with EGFR 20. At the moment renal function remained significantly low with BUN of 99 and creatinine of 4.73 and EGFR of 13. Still appears to be producing urine with 1.7 L of urine output over the preceding 24 hours.  Objective:  Vital signs in last 24 hours:  Temp:  [97.5 F (36.4 C)-98.3 F (36.8 C)] 97.5 F (36.4 C) (06/13 1138) Pulse Rate:  [58-111] 59 (06/13 1538) Resp:  [12-18] 17 (06/13 1538) BP: (124-142)/(60-90) 131/60 (06/13 1538) SpO2:  [96 %-99 %] 99 % (06/13 1538) Weight:  [89.8 kg] 89.8 kg (06/13 0618)  Weight change:  Filed Weights   11/24/23 0618  Weight: 89.8 kg    Intake/Output: I/O last 3 completed shifts: In: 480 [P.O.:480] Out: 1700 [Urine:1700]   Intake/Output this shift:  Total I/O In: 240 [P.O.:240] Out: 705 [Urine:705]  Physical Exam: General: NAD  Head: Normocephalic, atraumatic. Moist oral mucosal membranes  Eyes: Anicteric  Lungs:  Coarse breath sounds bilateral, normal effort  Heart: Regular rate and rhythm  Abdomen:  Soft, nontender  Extremities: No peripheral edema.  Neurologic: Awake, alert, conversant  Skin: Warm,dry, no rash  Access: None    Basic Metabolic Panel: Recent Labs  Lab 11/22/23 1447 11/23/23 0514 11/24/23 0524  NA 140 133* 133*  K 5.1 5.7* 4.9  CL 112* 106 105  CO2 18* 17* 18*  GLUCOSE 130* 429* 317*  BUN 88* 92* 99*  CREATININE 4.72* 4.74* 4.73*  CALCIUM  8.3* 7.8* 8.0*  MG  --   --  2.2  PHOS  --   --  5.6*    Liver Function Tests: Recent Labs  Lab 11/22/23 1447 11/24/23 0524  AST 16 14*  ALT 11 10  ALKPHOS 72 54  BILITOT 0.8 0.7  PROT 7.2 5.8*  ALBUMIN  2.7* 2.2*   No results for input(s): LIPASE, AMYLASE in the last 168  hours. No results for input(s): AMMONIA in the last 168 hours.  CBC: Recent Labs  Lab 11/22/23 1447 11/23/23 0514 11/24/23 0524  WBC 4.3 2.0* 6.3  NEUTROABS  --   --  5.5  HGB 10.1* 9.1* 8.1*  HCT 31.5* 28.7* 25.0*  MCV 90.8 91.7 89.3  PLT 234 184 182    Cardiac Enzymes: No results for input(s): CKTOTAL, CKMB, CKMBINDEX, TROPONINI in the last 168 hours.  BNP: Invalid input(s): POCBNP  CBG: Recent Labs  Lab 11/23/23 1602 11/23/23 2131 11/24/23 0744 11/24/23 1140 11/24/23 1543  GLUCAP 320* 309* 280* 321* 346*    Microbiology: Results for orders placed or performed during the hospital encounter of 11/22/23  Resp panel by RT-PCR (RSV, Flu A&B, Covid) Anterior Nasal Swab     Status: None   Collection Time: 11/22/23  8:26 PM   Specimen: Anterior Nasal Swab  Result Value Ref Range Status   SARS Coronavirus 2 by RT PCR NEGATIVE NEGATIVE Final    Comment: (NOTE) SARS-CoV-2 target nucleic acids are NOT DETECTED.  The SARS-CoV-2 RNA is generally detectable in upper respiratory specimens during the acute phase of infection. The lowest concentration of SARS-CoV-2 viral copies this assay can detect is 138 copies/mL. A negative result does not preclude SARS-Cov-2 infection and should not be used as the sole basis for treatment or other patient management decisions.  A negative result may occur with  improper specimen collection/handling, submission of specimen other than nasopharyngeal swab, presence of viral mutation(s) within the areas targeted by this assay, and inadequate number of viral copies(<138 copies/mL). A negative result must be combined with clinical observations, patient history, and epidemiological information. The expected result is Negative.  Fact Sheet for Patients:  BloggerCourse.com  Fact Sheet for Healthcare Providers:  SeriousBroker.it  This test is no t yet approved or cleared by the  [place]  FDA and  has been authorized for detection and/or diagnosis of SARS-CoV-2 by FDA under an Emergency Use Authorization (EUA). This EUA will remain  in effect (meaning this test can be used) for the duration of the COVID-19 declaration under Section 564(b)(1) of the Act, 21 U.S.C.section 360bbb-3(b)(1), unless the authorization is terminated  or revoked sooner.       Influenza A by PCR NEGATIVE NEGATIVE Final   Influenza B by PCR NEGATIVE NEGATIVE Final    Comment: (NOTE) The Xpert Xpress SARS-CoV-2/FLU/RSV plus assay is intended as an aid in the diagnosis of influenza from Nasopharyngeal swab specimens and should not be used as a sole basis for treatment. Nasal washings and aspirates are unacceptable for Xpert Xpress SARS-CoV-2/FLU/RSV testing.  Fact Sheet for Patients: BloggerCourse.com  Fact Sheet for Healthcare Providers: SeriousBroker.it  This test is not yet approved or cleared by the [place]  FDA and has been authorized for detection and/or diagnosis of SARS-CoV-2 by FDA under an Emergency Use Authorization (EUA). This EUA will remain in effect (meaning this test can be used) for the duration of the COVID-19 declaration under Section 564(b)(1) of the Act, 21 U.S.C. section 360bbb-3(b)(1), unless the authorization is terminated or revoked.     Resp Syncytial Virus by PCR NEGATIVE NEGATIVE Final    Comment: (NOTE) Fact Sheet for Patients: BloggerCourse.com  Fact Sheet for Healthcare Providers: SeriousBroker.it  This test is not yet approved or cleared by the [place]  FDA and has been authorized for detection and/or diagnosis of SARS-CoV-2 by FDA under an Emergency Use Authorization (EUA). This EUA will remain in effect (meaning this test can be used) for the duration of the COVID-19 declaration under Section 564(b)(1) of the Act, 21 U.S.C. section  360bbb-3(b)(1), unless the authorization is terminated or revoked.  Performed at [facility], [address] [place], [place] [address]     Coagulation Studies: No results for input(s): LABPROT, INR in the last 72 hours.  Urinalysis: No results for input(s): COLORURINE, LABSPEC, PHURINE, GLUCOSEU, HGBUR, BILIRUBINUR, KETONESUR, PROTEINUR, UROBILINOGEN, NITRITE, LEUKOCYTESUR in the last 72 hours.  Invalid input(s): APPERANCEUR    Imaging: DG Chest 2 View Result Date: 11/22/2023 CLINICAL DATA:  Shortness of breath. EXAM: CHEST - 2 VIEW COMPARISON:  Nov 07, 2023. FINDINGS: Stable cardiomediastinal silhouette. Mild bibasilar subsegmental atelectasis is noted with small pleural effusions. Bony thorax is unremarkable. IMPRESSION: Mild bibasilar subsegmental atelectasis with small pleural effusions. Electronically Signed   By: Rosalene Colon M.D.   On: 11/22/2023 17:40     Medications:     amLODipine   5 mg Oral BID   apixaban   5 mg Oral BID   budesonide -glycopyrrolate -formoterol   2 puff Inhalation BID   carvedilol   12.5 mg Oral BID WC   cyanocobalamin   1,000 mcg Oral Daily   ferrous sulfate   325 mg Oral QPM   insulin  aspart  0-5 Units Subcutaneous QHS   insulin  aspart  0-9 Units Subcutaneous TID WC   insulin  glargine-yfgn  15 Units Subcutaneous Daily  ipratropium-albuterol   3 mL Nebulization TID   nicotine   21 mg Transdermal Daily   pantoprazole   40 mg Oral Daily   predniSONE   40 mg Oral Q breakfast   rosuvastatin   10 mg Oral QHS   sodium bicarbonate   650 mg Oral TID   acetaminophen , albuterol , dextromethorphan -guaiFENesin , hydrALAZINE , methocarbamol , ondansetron  (ZOFRAN ) IV  Assessment/ Plan:  [name] is a 68 y.o.  male with past medical conditions including CHF, COPD, CAD, stroke, diabetes, CKDIV and hypertension, who was admitted to [facility] on 11/04/2023 for COPD exacerbation (HCC) [J44.1]   Acute kidney injury on  chronic kidney disease IV.  Patient appears to have recurrent acute kidney injury.  Has been on and off diuretics.  Baseline creatinine appears to be 3.25 with GFR 20 on 10/17/23.  History of solitary kidney. Patient follows with [facility] nephrology as outpatient.  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 Renal function worse at the moment with an EGFR of 13.  May represent progression of disease.  No urgent indication for dialysis but may need to consider this if renal function continues to deteriorate.  For now continue supportive care.   Lab Results  Component Value Date   CREATININE 4.73 (H) 11/24/2023   CREATININE 4.74 (H) 11/23/2023   CREATININE 4.72 (H) 11/22/2023    Intake/Output Summary (Last 24 hours) at 11/24/2023 1646 Last data filed at 11/24/2023 1439 Gross per 24 hour  Intake 240 ml  Output 1605 ml  Net -1365 ml   2. Chronic systolic heart failure, ECHO from 11/24 has EF 40%.  Previously treated with diuretics but now currently off.  3. Anemia of chronic kidney disease Lab Results  Component Value Date   HGB 8.1 (L) 11/24/2023  Patient received blood transfusion during prior admission.  Hemoglobin 8.1.  May need to consider Epogen as outpatient.   4. Diabetes mellitus type II with chronic kidney disease/renal manifestations: insulin  dependent. Home regimen includes Lantus  and humalog . Most recent hemoglobin A1c is 6.7 on 09/18/23.   Primary team managing blood glucose.   LOS: 0 Christy Friede 6/13/20254:46 PM

## 2023-11-25 DIAGNOSIS — J441 Chronic obstructive pulmonary disease with (acute) exacerbation: Secondary | ICD-10-CM | POA: Diagnosis not present

## 2023-11-25 LAB — CBC WITH DIFFERENTIAL/PLATELET
Abs Immature Granulocytes: 0.03 10*3/uL (ref 0.00–0.07)
Basophils Absolute: 0 10*3/uL (ref 0.0–0.1)
Basophils Relative: 0 %
Eosinophils Absolute: 0 10*3/uL (ref 0.0–0.5)
Eosinophils Relative: 0 %
HCT: 26.2 % — ABNORMAL LOW (ref 39.0–52.0)
Hemoglobin: 8.4 g/dL — ABNORMAL LOW (ref 13.0–17.0)
Immature Granulocytes: 1 %
Lymphocytes Relative: 14 %
Lymphs Abs: 0.6 10*3/uL — ABNORMAL LOW (ref 0.7–4.0)
MCH: 28.4 pg (ref 26.0–34.0)
MCHC: 32.1 g/dL (ref 30.0–36.0)
MCV: 88.5 fL (ref 80.0–100.0)
Monocytes Absolute: 0.2 10*3/uL (ref 0.1–1.0)
Monocytes Relative: 6 %
Neutro Abs: 3.2 10*3/uL (ref 1.7–7.7)
Neutrophils Relative %: 79 %
Platelets: 190 10*3/uL (ref 150–400)
RBC: 2.96 MIL/uL — ABNORMAL LOW (ref 4.22–5.81)
RDW: 12.9 % (ref 11.5–15.5)
WBC: 4 10*3/uL (ref 4.0–10.5)
nRBC: 0 % (ref 0.0–0.2)

## 2023-11-25 LAB — GLUCOSE, CAPILLARY
Glucose-Capillary: 257 mg/dL — ABNORMAL HIGH (ref 70–99)
Glucose-Capillary: 230 mg/dL — ABNORMAL HIGH (ref 70–99)
Glucose-Capillary: 260 mg/dL — ABNORMAL HIGH (ref 70–99)
Glucose-Capillary: 349 mg/dL — ABNORMAL HIGH (ref 70–99)

## 2023-11-25 LAB — COMPREHENSIVE METABOLIC PANEL WITH GFR
ALT: 13 U/L (ref 0–44)
AST: 14 U/L — ABNORMAL LOW (ref 15–41)
Albumin: 2.3 g/dL — ABNORMAL LOW (ref 3.5–5.0)
Alkaline Phosphatase: 49 U/L (ref 38–126)
Anion gap: 10 (ref 5–15)
BUN: 103 mg/dL — ABNORMAL HIGH (ref 8–23)
CO2: 18 mmol/L — ABNORMAL LOW (ref 22–32)
Calcium: 8.1 mg/dL — ABNORMAL LOW (ref 8.9–10.3)
Chloride: 104 mmol/L (ref 98–111)
Creatinine, Ser: 4.56 mg/dL — ABNORMAL HIGH (ref 0.61–1.24)
GFR, Estimated: 13 mL/min — ABNORMAL LOW (ref >60)
Glucose, Bld: 274 mg/dL — ABNORMAL HIGH (ref 70–99)
Potassium: 5 mmol/L (ref 3.5–5.1)
Sodium: 132 mmol/L — ABNORMAL LOW (ref 135–145)
Total Bilirubin: 0.7 mg/dL (ref 0.0–1.2)
Total Protein: 5.8 g/dL — ABNORMAL LOW (ref 6.5–8.1)

## 2023-11-25 LAB — MAGNESIUM: Magnesium: 2 mg/dL (ref 1.7–2.4)

## 2023-11-25 LAB — PHOSPHORUS: Phosphorus: 5 mg/dL — ABNORMAL HIGH (ref 2.5–4.6)
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
## 2023-11-25 NOTE — Plan of Care (Signed)
  Problem: Coping: Goal: Ability to adjust to condition or change in health will improve Outcome: Progressing   Problem: Fluid Volume: Goal: Ability to maintain a balanced intake and output will improve Outcome: Progressing   Problem: Nutrition: Goal: Adequate nutrition will be maintained Outcome: Progressing   Problem: Coping: Goal: Level of anxiety will decrease Outcome: Progressing   Problem: Safety: Goal: Ability to remain free from injury will improve Outcome: Progressing

## 2023-11-25 NOTE — Progress Notes (Signed)
 [facility]  ROUNDING NOTE   Subjective:  [name] is a 68 y.o.  male with past medical conditions including CHF, COPD, CAD, stroke, diabetes, CKDIV and hypertension, who was admitted to [facility] on 11/04/2023 for COPD exacerbation (HCC) [J44.1]  Update: Patient seen in bed resting. Spoke with patient about starting dialysis. He feels strongly to not start dialysis. He acknowledges that he only has one kidney and if he dies, he dies. Patient also concerned with finding placement for home as he cannot return to live with uncle because he would not be welcomed or allowed to return.    Intake/Output Summary (Last 24 hours) at 11/25/2023 1135 Last data filed at 11/25/2023 1020 Gross per 24 hour  Intake 240 ml  Output 500 ml  Net -260 ml     Objective:  Vital signs in last 24 hours:  Temp:  [97.5 F (36.4 C)-98.5 F (36.9 C)] 97.8 F (36.6 C) (06/14 0742) Pulse Rate:  [58-62] 60 (06/14 0742) Resp:  [16-18] 18 (06/14 0742) BP: (130-143)/(60-72) 143/72 (06/14 0742) SpO2:  [89 %-100 %] 89 % (06/14 0742)  Weight change:  Filed Weights   11/24/23 0618  Weight: 89.8 kg    Intake/Output: I/O last 3 completed shifts: In: 240 [P.O.:240] Out: 1605 [Urine:1605]   Intake/Output this shift:  Total I/O In: 240 [P.O.:240] Out: 200 [Urine:200]  Physical Exam: General: NAD,   Head: Normocephalic, atraumatic. Moist oral mucosal membranes  Eyes: Anicteric, PERRL  Neck: Supple, trachea midline  Lungs:  Room air  Heart: Regular rate and rhythm  Abdomen:  Soft, nontender,   Extremities:  No peripheral edema.  Neurologic: Nonfocal, moving all four extremities  Skin: No lesions  Access: None    Basic Metabolic Panel: Recent Labs  Lab 11/22/23 1447 11/23/23 0514 11/24/23 0524 11/25/23 0320  NA 140 133* 133* 132*  K 5.1 5.7* 4.9 5.0  CL 112* 106 105 104  CO2 18* 17* 18* 18*  GLUCOSE 130* 429* 317* 274*  BUN 88* 92* 99* 103*  CREATININE 4.72* 4.74*  4.73* 4.56*  CALCIUM  8.3* 7.8* 8.0* 8.1*  MG  --   --  2.2 2.0  PHOS  --   --  5.6* 5.0*    Liver Function Tests: Recent Labs  Lab 11/22/23 1447 11/24/23 0524 11/25/23 0320  AST 16 14* 14*  ALT 11 10 13   ALKPHOS 72 54 49  BILITOT 0.8 0.7 0.7  PROT 7.2 5.8* 5.8*  ALBUMIN  2.7* 2.2* 2.3*   No results for input(s): LIPASE, AMYLASE in the last 168 hours. No results for input(s): AMMONIA in the last 168 hours.  CBC: Recent Labs  Lab 11/22/23 1447 11/23/23 0514 11/24/23 0524 11/25/23 0320  WBC 4.3 2.0* 6.3 4.0  NEUTROABS  --   --  5.5 3.2  HGB 10.1* 9.1* 8.1* 8.4*  HCT 31.5* 28.7* 25.0* 26.2*  MCV 90.8 91.7 89.3 88.5  PLT 234 184 182 190    Cardiac Enzymes: No results for input(s): CKTOTAL, CKMB, CKMBINDEX, TROPONINI in the last 168 hours.  BNP: Invalid input(s): POCBNP  CBG: Recent Labs  Lab 11/24/23 0744 11/24/23 1140 11/24/23 1543 11/24/23 2229 11/25/23 0744  GLUCAP 280* 321* 346* 273* 257*    Microbiology: Results for orders placed or performed during the hospital encounter of 11/22/23  Resp panel by RT-PCR (RSV, Flu A&B, Covid) Anterior Nasal Swab     Status: None   Collection Time: 11/22/23  8:26 PM   Specimen: Anterior Nasal Swab  Result Value Ref Range Status   SARS Coronavirus 2 by RT PCR NEGATIVE NEGATIVE Final    Comment: (NOTE) SARS-CoV-2 target nucleic acids are NOT DETECTED.  The SARS-CoV-2 RNA is generally detectable in upper respiratory specimens during the acute phase of infection. The lowest concentration of SARS-CoV-2 viral copies this assay can detect is 138 copies/mL. A negative result does not preclude SARS-Cov-2 infection and should not be used as the sole basis for treatment or other patient management decisions. A negative result may occur with  improper specimen collection/handling, submission of specimen other than nasopharyngeal swab, presence of viral mutation(s) within the areas targeted by this assay, and  inadequate number of viral copies(<138 copies/mL). A negative result must be combined with clinical observations, patient history, and epidemiological information. The expected result is Negative.  Fact Sheet for Patients:  BloggerCourse.com  Fact Sheet for Healthcare Providers:  SeriousBroker.it  This test is no t yet approved or cleared by the [place]  FDA and  has been authorized for detection and/or diagnosis of SARS-CoV-2 by FDA under an Emergency Use Authorization (EUA). This EUA will remain  in effect (meaning this test can be used) for the duration of the COVID-19 declaration under Section 564(b)(1) of the Act, 21 U.S.C.section 360bbb-3(b)(1), unless the authorization is terminated  or revoked sooner.       Influenza A by PCR NEGATIVE NEGATIVE Final   Influenza B by PCR NEGATIVE NEGATIVE Final    Comment: (NOTE) The Xpert Xpress SARS-CoV-2/FLU/RSV plus assay is intended as an aid in the diagnosis of influenza from Nasopharyngeal swab specimens and should not be used as a sole basis for treatment. Nasal washings and aspirates are unacceptable for Xpert Xpress SARS-CoV-2/FLU/RSV testing.  Fact Sheet for Patients: BloggerCourse.com  Fact Sheet for Healthcare Providers: SeriousBroker.it  This test is not yet approved or cleared by the [place]  FDA and has been authorized for detection and/or diagnosis of SARS-CoV-2 by FDA under an Emergency Use Authorization (EUA). This EUA will remain in effect (meaning this test can be used) for the duration of the COVID-19 declaration under Section 564(b)(1) of the Act, 21 U.S.C. section 360bbb-3(b)(1), unless the authorization is terminated or revoked.     Resp Syncytial Virus by PCR NEGATIVE NEGATIVE Final    Comment: (NOTE) Fact Sheet for Patients: BloggerCourse.com  Fact Sheet for Healthcare  Providers: SeriousBroker.it  This test is not yet approved or cleared by the [place]  FDA and has been authorized for detection and/or diagnosis of SARS-CoV-2 by FDA under an Emergency Use Authorization (EUA). This EUA will remain in effect (meaning this test can be used) for the duration of the COVID-19 declaration under Section 564(b)(1) of the Act, 21 U.S.C. section 360bbb-3(b)(1), unless the authorization is terminated or revoked.  Performed at [facility], [address] [place], [place] [address]     Coagulation Studies: No results for input(s): LABPROT, INR in the last 72 hours.  Urinalysis: No results for input(s): COLORURINE, LABSPEC, PHURINE, GLUCOSEU, HGBUR, BILIRUBINUR, KETONESUR, PROTEINUR, UROBILINOGEN, NITRITE, LEUKOCYTESUR in the last 72 hours.  Invalid input(s): APPERANCEUR    Imaging: No results found.   Medications:     amLODipine   5 mg Oral BID   apixaban   5 mg Oral BID   budesonide -glycopyrrolate -formoterol   2 puff Inhalation BID   carvedilol   12.5 mg Oral BID WC   cyanocobalamin   1,000 mcg Oral Daily   ferrous sulfate   325 mg Oral QPM   insulin  aspart  0-20 Units Subcutaneous TID  WC   insulin  aspart  0-5 Units Subcutaneous QHS   insulin  glargine-yfgn  20 Units Subcutaneous Daily   ipratropium-albuterol   3 mL Nebulization TID   pantoprazole   40 mg Oral Daily   predniSONE   40 mg Oral Q breakfast   rosuvastatin   10 mg Oral QHS   sodium bicarbonate   650 mg Oral TID   acetaminophen , albuterol , dextromethorphan -guaiFENesin , hydrALAZINE , methocarbamol , ondansetron  (ZOFRAN ) IV  Assessment/ Plan:  [name] is a 68 y.o.  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  Patient appears to  have recurrent acute kidney injury.  Has been on and off diuretics.  Baseline creatinine appears to be 3.25 with GFR 20 on 10/17/23.  History of solitary kidney. Patient follows with [facility] nephrology as outpatient.  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 Renal function worse at the moment with an EGFR of 13.  May represent progression of disease.  No urgent indication for dialysis but may need to consider this if renal function continues to deteriorate.   Update: Attempted to discuss initiating renal replacement with dialysis and patient does not want it. Patient feels very strongly about dialysis and understands he only has one kidney. Will honor patient wishes and continue supportive care.    Lab Results  Component Value Date   CREATININE 4.56 (H) 11/25/2023   CREATININE 4.73 (H) 11/24/2023   CREATININE 4.74 (H) 11/23/2023     Intake/Output Summary (Last 24 hours) at 11/25/2023 1128 Last data filed at 11/25/2023 1020 Gross per 24 hour  Intake 240 ml  Output 500 ml  Net -260 ml      2. Chronic systolic heart failure, ECHO from 11/24 has EF 40%.  Previously treated with diuretics but now currently off.   3. Anemia of chronic kidney disease Recent Labs       Lab Results  Component Value Date    HGB 8.4 (L) 11/25/2023    Patient received blood transfusion during prior admission.  Hemoglobin 8.4.    4. Diabetes mellitus type II with chronic kidney disease/renal manifestations: insulin  dependent. Home regimen includes Lantus  and humalog . Most recent hemoglobin A1c is 6.7 on 09/18/23.              Primary team managing blood glucose.     LOS: 0 Veneda Kirksey P Henry Loge 6/14/202511:27 AM

## 2023-11-26 DIAGNOSIS — I4892 Unspecified atrial flutter: Secondary | ICD-10-CM | POA: Diagnosis not present

## 2023-11-26 DIAGNOSIS — J441 Chronic obstructive pulmonary disease with (acute) exacerbation: Secondary | ICD-10-CM | POA: Diagnosis not present

## 2023-11-26 LAB — CBC WITH DIFFERENTIAL/PLATELET
Abs Immature Granulocytes: 0.02 10*3/uL (ref 0.00–0.07)
Basophils Absolute: 0 10*3/uL (ref 0.0–0.1)
Basophils Relative: 0 %
Eosinophils Absolute: 0 10*3/uL (ref 0.0–0.5)
Eosinophils Relative: 0 %
HCT: 26.5 % — ABNORMAL LOW (ref 39.0–52.0)
Hemoglobin: 8.8 g/dL — ABNORMAL LOW (ref 13.0–17.0)
Immature Granulocytes: 1 %
Lymphocytes Relative: 19 %
Lymphs Abs: 0.8 10*3/uL (ref 0.7–4.0)
MCH: 28.9 pg (ref 26.0–34.0)
MCHC: 33.2 g/dL (ref 30.0–36.0)
MCV: 87.2 fL (ref 80.0–100.0)
Monocytes Absolute: 0.3 10*3/uL (ref 0.1–1.0)
Monocytes Relative: 8 %
Neutro Abs: 2.9 10*3/uL (ref 1.7–7.7)
Neutrophils Relative %: 72 %
Platelets: 190 10*3/uL (ref 150–400)
RBC: 3.04 MIL/uL — ABNORMAL LOW (ref 4.22–5.81)
RDW: 13 % (ref 11.5–15.5)
WBC: 4.1 10*3/uL (ref 4.0–10.5)
nRBC: 0 % (ref 0.0–0.2)

## 2023-11-26 LAB — COMPREHENSIVE METABOLIC PANEL WITH GFR
ALT: 14 U/L (ref 0–44)
AST: 15 U/L (ref 15–41)
Albumin: 2.3 g/dL — ABNORMAL LOW (ref 3.5–5.0)
Alkaline Phosphatase: 51 U/L (ref 38–126)
Anion gap: 10 (ref 5–15)
BUN: 112 mg/dL — ABNORMAL HIGH (ref 8–23)
CO2: 18 mmol/L — ABNORMAL LOW (ref 22–32)
Calcium: 8 mg/dL — ABNORMAL LOW (ref 8.9–10.3)
Chloride: 103 mmol/L (ref 98–111)
Creatinine, Ser: 4.39 mg/dL — ABNORMAL HIGH (ref 0.61–1.24)
GFR, Estimated: 14 mL/min — ABNORMAL LOW (ref >60)
Glucose, Bld: 311 mg/dL — ABNORMAL HIGH (ref 70–99)
Potassium: 4.8 mmol/L (ref 3.5–5.1)
Sodium: 131 mmol/L — ABNORMAL LOW (ref 135–145)
Total Bilirubin: 0.6 mg/dL (ref 0.0–1.2)
Total Protein: 5.7 g/dL — ABNORMAL LOW (ref 6.5–8.1)

## 2023-11-26 LAB — GLUCOSE, CAPILLARY
Glucose-Capillary: 360 mg/dL — ABNORMAL HIGH (ref 70–99)
Glucose-Capillary: 186 mg/dL — ABNORMAL HIGH (ref 70–99)
Glucose-Capillary: 199 mg/dL — ABNORMAL HIGH (ref 70–99)
Glucose-Capillary: 375 mg/dL — ABNORMAL HIGH (ref 70–99)

## 2023-11-26 LAB — MAGNESIUM: Magnesium: 2.1 mg/dL (ref 1.7–2.4)

## 2023-11-26 LAB — PHOSPHORUS: Phosphorus: 4.9 mg/dL — ABNORMAL HIGH (ref 2.5–4.6)

## 2023-11-26 MED ORDER — MAGIC MOUTHWASH W/LIDOCAINE
5.0000 mL | Freq: Three times a day (TID) | ORAL | Status: DC
  Administered 2023-11-26 – 2023-12-04 (×21): 5 mL via ORAL
  Filled 2023-11-26 (×24): qty 5

## 2023-11-26 MED ORDER — IPRATROPIUM-ALBUTEROL 0.5-2.5 (3) MG/3ML IN SOLN
3.0000 mL | Freq: Two times a day (BID) | RESPIRATORY_TRACT | Status: DC
  Administered 2023-11-26 – 2023-11-28 (×4): 3 mL via RESPIRATORY_TRACT
  Filled 2023-11-26 (×4): qty 3
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
## 2023-11-26 NOTE — Progress Notes (Signed)
 [facility]  ROUNDING NOTE   Subjective:  [name] is a 68 y.o. male with past medical conditions including CHF, COPD, CAD, stroke, diabetes, CKDIV and hypertension, who was admitted to [facility] on 11/04/2023 for COPD exacerbation (HCC) [J44.1]  Update: Patient seen in bed resting. Denies shortness of breath, denies pain.    Objective:  Vital signs in last 24 hours:  Temp:  [97.3 F (36.3 C)-98 F (36.7 C)] 97.7 F (36.5 C) (06/15 0731) Pulse Rate:  [59-71] 71 (06/15 0731) Resp:  [16-18] 18 (06/15 0731) BP: (128-145)/(64-77) 145/72 (06/15 0731) SpO2:  [95 %-99 %] 98 % (06/15 0731)  Weight change:  Filed Weights   11/24/23 0618  Weight: 89.8 kg    Intake/Output: I/O last 3 completed shifts: In: 480 [P.O.:480] Out: 700 [Urine:700]   Intake/Output this shift:  Total I/O In: -  Out: 800 [Urine:800]  Physical Exam: General: NAD,   Head: Normocephalic, atraumatic. Moist oral mucosal membranes  Eyes: Anicteric, PERRL  Neck: Supple, trachea midline  Lungs:  Dyspnea with speech  Heart: Regular rate and rhythm  Abdomen:  Soft, nontender,   Extremities:  Trace peripheral edema.  Neurologic: Nonfocal, moving all four extremities  Skin: No lesions  Access: None    Basic Metabolic Panel: Recent Labs  Lab 11/22/23 1447 11/23/23 0514 11/24/23 0524 11/25/23 0320 11/26/23 0612  NA 140 133* 133* 132* 131*  K 5.1 5.7* 4.9 5.0 4.8  CL 112* 106 105 104 103  CO2 18* 17* 18* 18* 18*  GLUCOSE 130* 429* 317* 274* 311*  BUN 88* 92* 99* 103* 112*  CREATININE 4.72* 4.74* 4.73* 4.56* 4.39*  CALCIUM  8.3* 7.8* 8.0* 8.1* 8.0*  MG  --   --  2.2 2.0 2.1  PHOS  --   --  5.6* 5.0* 4.9*    Liver Function Tests: Recent Labs  Lab 11/22/23 1447 11/24/23 0524 11/25/23 0320 11/26/23 0612  AST 16 14* 14* 15  ALT 11 10 13 14   ALKPHOS 72 54 49 51  BILITOT 0.8 0.7 0.7 0.6  PROT 7.2 5.8* 5.8* 5.7*  ALBUMIN  2.7* 2.2* 2.3* 2.3*   No results for input(s):  LIPASE, AMYLASE in the last 168 hours. No results for input(s): AMMONIA in the last 168 hours.  CBC: Recent Labs  Lab 11/22/23 1447 11/23/23 0514 11/24/23 0524 11/25/23 0320 11/26/23 0612  WBC 4.3 2.0* 6.3 4.0 4.1  NEUTROABS  --   --  5.5 3.2 2.9  HGB 10.1* 9.1* 8.1* 8.4* 8.8*  HCT 31.5* 28.7* 25.0* 26.2* 26.5*  MCV 90.8 91.7 89.3 88.5 87.2  PLT 234 184 182 190 190    Cardiac Enzymes: No results for input(s): CKTOTAL, CKMB, CKMBINDEX, TROPONINI in the last 168 hours.  BNP: Invalid input(s): POCBNP  CBG: Recent Labs  Lab 11/25/23 0744 11/25/23 1153 11/25/23 1643 11/25/23 1938 11/26/23 0732  GLUCAP 257* 230* 260* 349* 360*    Microbiology: Results for orders placed or performed during the hospital encounter of 11/22/23  Resp panel by RT-PCR (RSV, Flu A&B, Covid) Anterior Nasal Swab     Status: None   Collection Time: 11/22/23  8:26 PM   Specimen: Anterior Nasal Swab  Result Value Ref Range Status   SARS Coronavirus 2 by RT PCR NEGATIVE NEGATIVE Final    Comment: (NOTE) SARS-CoV-2 target nucleic acids are NOT DETECTED.  The SARS-CoV-2 RNA is generally detectable in upper respiratory specimens during the acute phase of infection. The lowest concentration of SARS-CoV-2 viral copies  this assay can detect is 138 copies/mL. A negative result does not preclude SARS-Cov-2 infection and should not be used as the sole basis for treatment or other patient management decisions. A negative result may occur with  improper specimen collection/handling, submission of specimen other than nasopharyngeal swab, presence of viral mutation(s) within the areas targeted by this assay, and inadequate number of viral copies(<138 copies/mL). A negative result must be combined with clinical observations, patient history, and epidemiological information. The expected result is Negative.  Fact Sheet for Patients:  BloggerCourse.com  Fact Sheet for  Healthcare Providers:  SeriousBroker.it  This test is no t yet approved or cleared by the [place]  FDA and  has been authorized for detection and/or diagnosis of SARS-CoV-2 by FDA under an Emergency Use Authorization (EUA). This EUA will remain  in effect (meaning this test can be used) for the duration of the COVID-19 declaration under Section 564(b)(1) of the Act, 21 U.S.C.section 360bbb-3(b)(1), unless the authorization is terminated  or revoked sooner.       Influenza A by PCR NEGATIVE NEGATIVE Final   Influenza B by PCR NEGATIVE NEGATIVE Final    Comment: (NOTE) The Xpert Xpress SARS-CoV-2/FLU/RSV plus assay is intended as an aid in the diagnosis of influenza from Nasopharyngeal swab specimens and should not be used as a sole basis for treatment. Nasal washings and aspirates are unacceptable for Xpert Xpress SARS-CoV-2/FLU/RSV testing.  Fact Sheet for Patients: BloggerCourse.com  Fact Sheet for Healthcare Providers: SeriousBroker.it  This test is not yet approved or cleared by the [place]  FDA and has been authorized for detection and/or diagnosis of SARS-CoV-2 by FDA under an Emergency Use Authorization (EUA). This EUA will remain in effect (meaning this test can be used) for the duration of the COVID-19 declaration under Section 564(b)(1) of the Act, 21 U.S.C. section 360bbb-3(b)(1), unless the authorization is terminated or revoked.     Resp Syncytial Virus by PCR NEGATIVE NEGATIVE Final    Comment: (NOTE) Fact Sheet for Patients: BloggerCourse.com  Fact Sheet for Healthcare Providers: SeriousBroker.it  This test is not yet approved or cleared by the [place]  FDA and has been authorized for detection and/or diagnosis of SARS-CoV-2 by FDA under an Emergency Use Authorization (EUA). This EUA will remain in effect (meaning this  test can be used) for the duration of the COVID-19 declaration under Section 564(b)(1) of the Act, 21 U.S.C. section 360bbb-3(b)(1), unless the authorization is terminated or revoked.  Performed at [facility], [address] [place], [place] [address]     Coagulation Studies: No results for input(s): LABPROT, INR in the last 72 hours.  Urinalysis: No results for input(s): COLORURINE, LABSPEC, PHURINE, GLUCOSEU, HGBUR, BILIRUBINUR, KETONESUR, PROTEINUR, UROBILINOGEN, NITRITE, LEUKOCYTESUR in the last 72 hours.  Invalid input(s): APPERANCEUR    Imaging: No results found.   Medications:     amLODipine   5 mg Oral BID   apixaban   5 mg Oral BID   budesonide -glycopyrrolate -formoterol   2 puff Inhalation BID   carvedilol   12.5 mg Oral BID WC   cyanocobalamin   1,000 mcg Oral Daily   ferrous sulfate   325 mg Oral QPM   insulin  aspart  0-20 Units Subcutaneous TID WC   insulin  aspart  0-5 Units Subcutaneous QHS   insulin  glargine-yfgn  20 Units Subcutaneous Daily   ipratropium-albuterol   3 mL Nebulization BID   pantoprazole   40 mg Oral Daily   predniSONE   40 mg Oral Q breakfast   rosuvastatin   10 mg Oral QHS  sodium bicarbonate   650 mg Oral TID   acetaminophen , albuterol , dextromethorphan -guaiFENesin , hydrALAZINE , methocarbamol , ondansetron  (ZOFRAN ) IV  Assessment/ Plan:  Mr. Nalu Troublefield Dilauro is a 68 y.o.  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  Patient appears to have recurrent acute kidney injury.  Has been on and off diuretics.  Baseline creatinine appears to be 3.25 with GFR 20 on 10/17/23.  History of solitary kidney. Patient follows with [facility] nephrology as outpatient.  Last seen in December 2024.Patient has history of multiple cysts in the right  kidney with largest being 7.9 cm x 8.8 cm. Renal function worse at the moment with an EGFR of 13.  May represent progression of disease.  No urgent indication for dialysis but may need to consider this if renal function continues to deteriorate.   Attempted to discuss initiating renal replacement with dialysis yesterday and patient does not want it. Honoring patient wishes and continuing supportive care. -Patient continues to be on room air.  ->500cc urine output noted in canister not yet added to output totals. -avoid nephrotoxic agents    Lab Results  Component Value Date   CREATININE 4.39 (H) 11/26/2023   CREATININE 4.56 (H) 11/25/2023   CREATININE 4.73 (H) 11/24/2023     Intake/Output Summary (Last 24 hours) at 11/26/2023 1129 Last data filed at 11/26/2023 1124 Gross per 24 hour  Intake 240 ml  Output 950 ml  Net -710 ml    2. Hypertension of Chronic Disease Currently on amlodipine  and carvedilol . PRN hydralazine . BP 145/72   3. Anemia of chronic kidney disease Recent Labs           Lab Results  Component Value Date    HGB 8.8 (L) 11/26/2023    Patient received blood transfusion during prior admission.  Hemoglobin 8.8   4. Diabetes mellitus type II with chronic kidney disease/renal manifestations: insulin  dependent. Home regimen includes Lantus  and humalog . Most recent hemoglobin A1c is 6.7 on 09/18/23.              Primary team managing blood glucose.   LOS: 0 Anberlyn Feimster P Henry Loge 6/15/202511:26 AM
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
## 2023-11-26 NOTE — Plan of Care (Signed)
  Problem: Activity: Goal: Risk for activity intolerance will decrease Outcome: Progressing   Problem: Nutrition: Goal: Adequate nutrition will be maintained Outcome: Progressing   Problem: Elimination: Goal: Will not experience complications related to urinary retention Outcome: Progressing   Problem: Pain Managment: Goal: General experience of comfort will improve and/or be controlled Outcome: Progressing   Problem: Safety: Goal: Ability to remain free from injury will improve Outcome: Progressing   Problem: Skin Integrity: Goal: Risk for impaired skin integrity will decrease Outcome: Progressing   Problem: Education: Goal: Individualized Educational Video(s) Outcome: Not Applicable

## 2023-11-27 DIAGNOSIS — J441 Chronic obstructive pulmonary disease with (acute) exacerbation: Secondary | ICD-10-CM | POA: Diagnosis not present

## 2023-11-27 DIAGNOSIS — I4892 Unspecified atrial flutter: Secondary | ICD-10-CM | POA: Diagnosis not present

## 2023-11-27 LAB — COMPREHENSIVE METABOLIC PANEL WITH GFR
ALT: 18 U/L (ref 0–44)
AST: 18 U/L (ref 15–41)
Albumin: 2.3 g/dL — ABNORMAL LOW (ref 3.5–5.0)
Alkaline Phosphatase: 51 U/L (ref 38–126)
Anion gap: 11 (ref 5–15)
BUN: 118 mg/dL — ABNORMAL HIGH (ref 8–23)
CO2: 17 mmol/L — ABNORMAL LOW (ref 22–32)
Calcium: 8.1 mg/dL — ABNORMAL LOW (ref 8.9–10.3)
Chloride: 105 mmol/L (ref 98–111)
Creatinine, Ser: 4 mg/dL — ABNORMAL HIGH (ref 0.61–1.24)
GFR, Estimated: 15 mL/min — ABNORMAL LOW (ref >60)
Glucose, Bld: 345 mg/dL — ABNORMAL HIGH (ref 70–99)
Potassium: 4.9 mmol/L (ref 3.5–5.1)
Sodium: 133 mmol/L — ABNORMAL LOW (ref 135–145)
Total Bilirubin: 0.7 mg/dL (ref 0.0–1.2)
Total Protein: 6 g/dL — ABNORMAL LOW (ref 6.5–8.1)

## 2023-11-27 LAB — GLUCOSE, CAPILLARY
Glucose-Capillary: 287 mg/dL — ABNORMAL HIGH (ref 70–99)
Glucose-Capillary: 301 mg/dL — ABNORMAL HIGH (ref 70–99)
Glucose-Capillary: 168 mg/dL — ABNORMAL HIGH (ref 70–99)
Glucose-Capillary: 209 mg/dL — ABNORMAL HIGH (ref 70–99)

## 2023-11-27 LAB — CBC WITH DIFFERENTIAL/PLATELET
Abs Immature Granulocytes: 0.04 10*3/uL (ref 0.00–0.07)
Basophils Absolute: 0 10*3/uL (ref 0.0–0.1)
Basophils Relative: 0 %
Eosinophils Absolute: 0 10*3/uL (ref 0.0–0.5)
Eosinophils Relative: 0 %
HCT: 27 % — ABNORMAL LOW (ref 39.0–52.0)
Hemoglobin: 8.7 g/dL — ABNORMAL LOW (ref 13.0–17.0)
Immature Granulocytes: 1 %
Lymphocytes Relative: 11 %
Lymphs Abs: 0.5 10*3/uL — ABNORMAL LOW (ref 0.7–4.0)
MCH: 28.1 pg (ref 26.0–34.0)
MCHC: 32.2 g/dL (ref 30.0–36.0)
MCV: 87.1 fL (ref 80.0–100.0)
Monocytes Absolute: 0.1 10*3/uL (ref 0.1–1.0)
Monocytes Relative: 2 %
Neutro Abs: 3.7 10*3/uL (ref 1.7–7.7)
Neutrophils Relative %: 86 %
Platelets: 198 10*3/uL (ref 150–400)
RBC: 3.1 MIL/uL — ABNORMAL LOW (ref 4.22–5.81)
RDW: 12.9 % (ref 11.5–15.5)
WBC: 4.3 10*3/uL (ref 4.0–10.5)
nRBC: 0 % (ref 0.0–0.2)

## 2023-11-27 LAB — MAGNESIUM: Magnesium: 2.2 mg/dL (ref 1.7–2.4)

## 2023-11-27 LAB — PHOSPHORUS: Phosphorus: 4.7 mg/dL — ABNORMAL HIGH (ref 2.5–4.6)

## 2023-11-27 MED ORDER — INSULIN GLARGINE-YFGN 100 UNIT/ML ~~LOC~~ SOLN
25.0000 [IU] | Freq: Every day | SUBCUTANEOUS | Status: DC
  Administered 2023-11-27 – 2023-12-03 (×7): 25 [IU] via SUBCUTANEOUS
  Filled 2023-11-27 (×7): qty 0.25

## 2023-11-27 NOTE — Progress Notes (Signed)
 [facility]  ROUNDING NOTE   Subjective:  [name] is a 68 y.o. male with past medical conditions including CHF, COPD, CAD, stroke, diabetes, CKDIV and hypertension, who was admitted to [facility] on 11/04/2023 for COPD exacerbation (HCC) [J44.1]  Update: Patient seen in bed resting. Denies shortness of breath, denies pain.  No edema   Objective:  Vital signs in last 24 hours:  Temp:  [97.6 F (36.4 C)-97.9 F (36.6 C)] 97.9 F (36.6 C) (06/16 0747) Pulse Rate:  [54-61] 61 (06/16 0747) Resp:  [16-18] 16 (06/16 0747) BP: (135-149)/(69-71) 149/70 (06/16 0747) SpO2:  [98 %-99 %] 99 % (06/16 0747) Weight:  [90.9 kg] 90.9 kg (06/16 0500)  Weight change:  Filed Weights   11/24/23 0618 11/27/23 0500  Weight: 89.8 kg 90.9 kg    Intake/Output: I/O last 3 completed shifts: In: 120 [P.O.:120] Out: 1750 [Urine:1750]   Intake/Output this shift:  Total I/O In: 240 [P.O.:240] Out: 550 [Urine:550]  Physical Exam: General: NAD,   Head: Normocephalic, atraumatic. Moist oral mucosal membranes  Eyes: Anicteric,    Neck: Supple,    Lungs:  Normal breathing effort on room air  Heart: Regular rate and rhythm  Abdomen:  Soft, nontender,   Extremities:  Trace peripheral edema.  Neurologic: Nonfocal, moving all four extremities  Skin: No lesions  Access: None    Basic Metabolic Panel: Recent Labs  Lab 11/23/23 0514 11/24/23 0524 11/25/23 0320 11/26/23 0612 11/27/23 0310  NA 133* 133* 132* 131* 133*  K 5.7* 4.9 5.0 4.8 4.9  CL 106 105 104 103 105  CO2 17* 18* 18* 18* 17*  GLUCOSE 429* 317* 274* 311* 345*  BUN 92* 99* 103* 112* 118*  CREATININE 4.74* 4.73* 4.56* 4.39* 4.00*  CALCIUM  7.8* 8.0* 8.1* 8.0* 8.1*  MG  --  2.2 2.0 2.1 2.2  PHOS  --  5.6* 5.0* 4.9* 4.7*    Liver Function Tests: Recent Labs  Lab 11/22/23 1447 11/24/23 0524 11/25/23 0320 11/26/23 0612 11/27/23 0310  AST 16 14* 14* 15 18  ALT 11 10 13 14 18   ALKPHOS 72 54 49 51 51   BILITOT 0.8 0.7 0.7 0.6 0.7  PROT 7.2 5.8* 5.8* 5.7* 6.0*  ALBUMIN  2.7* 2.2* 2.3* 2.3* 2.3*   No results for input(s): LIPASE, AMYLASE in the last 168 hours. No results for input(s): AMMONIA in the last 168 hours.  CBC: Recent Labs  Lab 11/23/23 0514 11/24/23 0524 11/25/23 0320 11/26/23 0612 11/27/23 0310  WBC 2.0* 6.3 4.0 4.1 4.3  NEUTROABS  --  5.5 3.2 2.9 3.7  HGB 9.1* 8.1* 8.4* 8.8* 8.7*  HCT 28.7* 25.0* 26.2* 26.5* 27.0*  MCV 91.7 89.3 88.5 87.2 87.1  PLT 184 182 190 190 198    Cardiac Enzymes: No results for input(s): CKTOTAL, CKMB, CKMBINDEX, TROPONINI in the last 168 hours.  BNP: Invalid input(s): POCBNP  CBG: Recent Labs  Lab 11/26/23 1208 11/26/23 1703 11/26/23 2101 11/27/23 0740 11/27/23 1129  GLUCAP 186* 199* 375* 287* 301*    Microbiology: Results for orders placed or performed during the hospital encounter of 11/22/23  Resp panel by RT-PCR (RSV, Flu A&B, Covid) Anterior Nasal Swab     Status: None   Collection Time: 11/22/23  8:26 PM   Specimen: Anterior Nasal Swab  Result Value Ref Range Status   SARS Coronavirus 2 by RT PCR NEGATIVE NEGATIVE Final    Comment: (NOTE) SARS-CoV-2 target nucleic acids are NOT DETECTED.  The SARS-CoV-2 RNA  is generally detectable in upper respiratory specimens during the acute phase of infection. The lowest concentration of SARS-CoV-2 viral copies this assay can detect is 138 copies/mL. A negative result does not preclude SARS-Cov-2 infection and should not be used as the sole basis for treatment or other patient management decisions. A negative result may occur with  improper specimen collection/handling, submission of specimen other than nasopharyngeal swab, presence of viral mutation(s) within the areas targeted by this assay, and inadequate number of viral copies(<138 copies/mL). A negative result must be combined with clinical observations, patient history, and epidemiological information.  The expected result is Negative.  Fact Sheet for Patients:  BloggerCourse.com  Fact Sheet for Healthcare Providers:  SeriousBroker.it  This test is no t yet approved or cleared by the [place]  FDA and  has been authorized for detection and/or diagnosis of SARS-CoV-2 by FDA under an Emergency Use Authorization (EUA). This EUA will remain  in effect (meaning this test can be used) for the duration of the COVID-19 declaration under Section 564(b)(1) of the Act, 21 U.S.C.section 360bbb-3(b)(1), unless the authorization is terminated  or revoked sooner.       Influenza A by PCR NEGATIVE NEGATIVE Final   Influenza B by PCR NEGATIVE NEGATIVE Final    Comment: (NOTE) The Xpert Xpress SARS-CoV-2/FLU/RSV plus assay is intended as an aid in the diagnosis of influenza from Nasopharyngeal swab specimens and should not be used as a sole basis for treatment. Nasal washings and aspirates are unacceptable for Xpert Xpress SARS-CoV-2/FLU/RSV testing.  Fact Sheet for Patients: BloggerCourse.com  Fact Sheet for Healthcare Providers: SeriousBroker.it  This test is not yet approved or cleared by the [place]  FDA and has been authorized for detection and/or diagnosis of SARS-CoV-2 by FDA under an Emergency Use Authorization (EUA). This EUA will remain in effect (meaning this test can be used) for the duration of the COVID-19 declaration under Section 564(b)(1) of the Act, 21 U.S.C. section 360bbb-3(b)(1), unless the authorization is terminated or revoked.     Resp Syncytial Virus by PCR NEGATIVE NEGATIVE Final    Comment: (NOTE) Fact Sheet for Patients: BloggerCourse.com  Fact Sheet for Healthcare Providers: SeriousBroker.it  This test is not yet approved or cleared by the [place]  FDA and has been authorized for detection and/or  diagnosis of SARS-CoV-2 by FDA under an Emergency Use Authorization (EUA). This EUA will remain in effect (meaning this test can be used) for the duration of the COVID-19 declaration under Section 564(b)(1) of the Act, 21 U.S.C. section 360bbb-3(b)(1), unless the authorization is terminated or revoked.  Performed at [facility], [address] [place], [place] [address]     Coagulation Studies: No results for input(s): LABPROT, INR in the last 72 hours.  Urinalysis: No results for input(s): COLORURINE, LABSPEC, PHURINE, GLUCOSEU, HGBUR, BILIRUBINUR, KETONESUR, PROTEINUR, UROBILINOGEN, NITRITE, LEUKOCYTESUR in the last 72 hours.  Invalid input(s): APPERANCEUR    Imaging: No results found.   Medications:     amLODipine   5 mg Oral BID   apixaban   5 mg Oral BID   budesonide -glycopyrrolate -formoterol   2 puff Inhalation BID   carvedilol   12.5 mg Oral BID WC   cyanocobalamin   1,000 mcg Oral Daily   ferrous sulfate   325 mg Oral QPM   insulin  aspart  0-20 Units Subcutaneous TID WC   insulin  aspart  0-5 Units Subcutaneous QHS   insulin  glargine-yfgn  25 Units Subcutaneous Daily   ipratropium-albuterol   3 mL Nebulization BID   magic mouthwash w/lidocaine   5 mL Oral TID   pantoprazole   40 mg Oral Daily   rosuvastatin   10 mg Oral QHS   sodium bicarbonate   650 mg Oral TID   acetaminophen , albuterol , dextromethorphan -guaiFENesin , methocarbamol , ondansetron  (ZOFRAN ) IV  Assessment/ Plan:  [name] is a 68 y.o.  male with past medical conditions including CHF, COPD, CAD, stroke, diabetes, CKDIV and hypertension, who was admitted to [facility] on 11/04/2023 for COPD exacerbation (HCC) [J44.1]     Acute kidney injury on chronic kidney disease IV.  Patient appears to have recurrent acute kidney injury.  Has been on and off diuretics.  Baseline creatinine appears to be 3.25 with GFR 20 on 10/17/23.  History of solitary kidney. Patient  follows with [facility] nephrology as outpatient.  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 - Creatinine slightly improved today.  Urine output good at 1600. -Patient has expressed multiple times that he would not want to take chronic dialysis.  He would rather die. -Continue supportive care.   2. Hypertension of Chronic Disease Currently on amlodipine  and carvedilol .   Blood pressure control is acceptable.   3. Anemia of chronic kidney disease Patient received blood transfusion this admission.  11/11/2023, 11/15/2023. Most recent hemoglobin 8.7, acceptable.   4. Diabetes mellitus type II with chronic kidney disease/renal manifestations: insulin  dependent. Home regimen includes Lantus  and humalog . Most recent hemoglobin A1c is 6.7 on 09/18/23.              Primary team managing blood glucose.   LOS: 0 Khameron Gruenwald 6/16/202512:36 PM

## 2023-11-27 NOTE — Plan of Care (Signed)
 The patient has not had any s/s of respiratory distress noted.

## 2023-11-27 NOTE — Progress Notes (Signed)
 Physical Therapy Treatment Patient Details Name: Larry Callahan MRN: [id number] DOB: 11-04-1955 Today's Date: 11/27/2023   History of Present Illness Larry Callahan is a 68 year old male with history of CHF, a flutter on Eliquis , CKD, T2DM, and CAD who presenting to the ER for housing concerns. MD diagnoses includes COPD, CHF, HTN, atrial flutter, type II diabetes, HLD, and CAD.    PT Comments  Pt required words of encouragement to participate in today's session and put forth good effort throughout. Pt independently transferred from supine to sitting EOB with no physical assistance or use of bedrails. Pt completed STS from EOB with RW with CGA. Pt ambulated 350 feet with RW and supervision for pt safety. Pt did not demonstrate any LOBs or unsteadiness throughout ambulation, but required VC's to remain on task, due to being easily distracted. Pt will benefit from continued PT services upon discharge to safely address deficits listed in patient problem list for decreased caregiver assistance and eventual return to PLOF.     If plan is discharge home, recommend the following: A little help with walking and/or transfers;A little help with bathing/dressing/bathroom;Assistance with cooking/housework;Assist for transportation;Help with stairs or ramp for entrance   Can travel by private vehicle     Yes  Equipment Recommendations  None recommended by PT    Recommendations for Other Services       Precautions / Restrictions Precautions Precautions: Fall Recall of Precautions/Restrictions: Impaired Restrictions Weight Bearing Restrictions Per Provider Order: No     Mobility  Bed Mobility Overal bed mobility: Independent Bed Mobility: Rolling, Sidelying to Sit, Supine to Sit Rolling: Independent Sidelying to sit: Independent Supine to sit: Independent Sit to supine: Independent   General bed mobility comments: Pt was able to perform bed mobility with no physical assistance  or bedrails    Transfers Overall transfer level: Needs assistance Equipment used: Rolling walker (2 wheels) Transfers: Sit to/from Stand Sit to Stand: CGA           General transfer comment: Pt was able to complete STS from EOB with min posterior instability that pt was able to self correct.       Ambulation/Gait Ambulation/Gait assistance: Supervision Gait Distance (Feet): 350 Feet Assistive device: Rolling walker (2 wheels) Gait Pattern/deviations: Step-through pattern, Trunk flexed, Decreased step length - right, Decreased step length - left Gait velocity: decreased     General Gait Details: Pt remained steady throughout ambulation, no LOBs or safety concerns, pt easily distracted in hallway, required VC's to remain on task. VC's were provided to bring RW closer to trunk to prevent flexed trunk with minimal pt carryover.    Stairs             Wheelchair Mobility     Tilt Bed    Modified Rankin (Stroke Patients Only)       Balance Overall balance assessment: History of Falls, Needs assistance Sitting-balance support: Feet supported Sitting balance-Leahy Scale: Normal Sitting balance - Comments: Pt able to maintain balance independently at EOB with no physical assistance or holding onto bedrails Postural control: Posterior lean Standing balance support: Reliant on assistive device for balance, Bilateral upper extremity supported, During functional activity Standing balance-Leahy Scale: Good Standing balance comment: Pt maintained balance in standing with BUE support from RW                            Communication Communication Communication: Impaired Factors Affecting Communication: Hearing impaired  Cognition Arousal:  Alert Behavior During Therapy: WFL for tasks assessed/performed                           PT - Cognition Comments: Repeatedly stated his desire not to return home with nephew Following commands:  Impaired Following commands impaired: Follows one step commands inconsistently, Follows multi-step commands inconsistently    Cueing Cueing Techniques: Verbal cues, Tactile cues  Exercises      General Comments        Pertinent Vitals/Pain Pain Assessment Pain Assessment: No/denies pain    Home Living                          Prior Function            PT Goals (current goals can now be found in the care plan section) Progress towards PT goals: Progressing toward goals    Frequency    Min 2X/week      PT Plan      Co-evaluation              AM-PAC PT 6 Clicks Mobility   Outcome Measure  Help needed turning from your back to your side while in a flat bed without using bedrails?: None Help needed moving from lying on your back to sitting on the side of a flat bed without using bedrails?: None Help needed moving to and from a bed to a chair (including a wheelchair)?: None Help needed standing up from a chair using your arms (e.g., wheelchair or bedside chair)?: A Little Help needed to walk in hospital room?: A Little Help needed climbing 3-5 steps with a railing? : A Little 6 Click Score: 21    End of Session Equipment Utilized During Treatment: Gait belt Activity Tolerance: Patient tolerated treatment well Patient left: with nursing/sitter in room;Other (comment) (Pt left in standing in room with nurse present for bathing/grooming) Nurse Communication: Mobility status PT Visit Diagnosis: Repeated falls (R29.6);History of falling (Z91.81);Unsteadiness on feet (R26.81);Muscle weakness (generalized) (M62.81)     Time: 1610-9604 PT Time Calculation (min) (ACUTE ONLY): 19 min  Charges:                            Rhoda Centers, SPT 11/27/23, 4:58 PM

## 2023-11-27 NOTE — TOC Progression Note (Signed)
 Transition of Care [facility]) - Progression Note    Patient Details  Name: Larry Callahan MRN: [id number] Date of Birth: February 20, 1956  [facility]) CM/SW Contact  Crayton Docker, RN Phone Number: 11/27/2023, 12:14 PM  Clinical Narrative:     CM alert to Dr. Marquette Sites regarding signature for FL2. CM faxed SNF referrals to [facility], [facility], [facility], [facility] and [facility].    Expected Discharge Plan and Services   SNF   Social Determinants of Health (SDOH) Interventions SDOH Screenings   Food Insecurity: No Food Insecurity (11/23/2023)  Housing: High Risk (11/23/2023)  Transportation Needs: No Transportation Needs (11/23/2023)  Utilities: Not At Risk (11/23/2023)  Alcohol Screen: Low Risk  (03/29/2022)  Depression (PHQ2-9): Low Risk  (09/13/2022)  Financial Resource Strain: Low Risk  (11/16/2023)  Social Connections: Unknown (11/23/2023)  Tobacco Use: High Risk (11/22/2023)    Readmission Risk Interventions    12/01/2021   12:25 PM 08/07/2021    2:36 PM 06/28/2021    2:26 PM  Readmission Risk Prevention Plan  Transportation Screening Complete Complete Complete  Medication Review Oceanographer) Complete Complete Complete  PCP or Specialist appointment within 3-5 days of discharge Complete Complete Complete  HRI or Home Care Consult Complete Complete Complete  SW Recovery Care/Counseling Consult Complete Complete Complete  Palliative Care Screening Complete Complete Not Applicable  Skilled Nursing Facility Complete Complete Not Applicable

## 2023-11-27 NOTE — NC FL2 (Signed)
 [place]  MEDICAID FL2 LEVEL OF CARE FORM     IDENTIFICATION  Patient Name: Larry Callahan Birthdate: [birth year] Sex: male Admission Date (Current Location): 11/22/2023  [facility] and [place] Number:  Chiropodist and Address:  [facility], [address] [place], [place] [address]      Provider Number: [id number]  Attending Physician Name and Address:  Roise Cleaver, DO  Relative Name and Phone Number:  Andrea Banda, nephew, phone: [phone]    Current Level of Care: Hospital Recommended Level of Care: Skilled Nursing Facility Prior Approval Number:    Date Approved/Denied: 04/12/23 PASRR Number: [id number]  Discharge Plan: SNF    Current Diagnoses: Patient Active Problem List   Diagnosis Date Noted   Type II diabetes mellitus with renal manifestations (HCC) 11/22/2023   CAD (coronary artery disease) 11/22/2023   Stroke (HCC) 11/22/2023   CHF exacerbation (HCC) 11/06/2023   Acute on chronic HFrEF (heart failure with reduced ejection fraction) (HCC) 11/04/2023   Acute kidney injury superimposed on chronic kidney disease (HCC) 11/04/2023   COPD exacerbation (HCC) 10/14/2023   Fall at home, initial encounter 10/14/2023   COPD with acute exacerbation (HCC) 10/10/2023   Metabolic acidosis, normal anion gap (NAG) 10/10/2023   Positive D dimer 09/23/2022   Hemoptysis 09/23/2022   Left arm numbness 09/13/2022   Acute upper GI bleed 07/31/2022   Chest pain 07/31/2022   Hematoma of left flank 07/31/2022   Acute on chronic anemia 07/31/2022   Renal mass 07/31/2022   Upper GI bleed    Dizziness and giddiness 01/04/2022   Umbilical hernia 01/04/2022   Acute on chronic systolic CHF (congestive heart failure) (HCC) 08/05/2021   Acquired thrombophilia (HCC) 06/28/2021   Anemia of chronic kidney failure 06/28/2021   PAD (peripheral artery disease) (HCC)    Cellulitis of right arm 03/02/2021   Atrial flutter (HCC) 01/07/2021    Demand ischemia (HCC)    Influenza A 10/30/2020   HLD (hyperlipidemia) 10/06/2020   Gastroesophageal reflux disease without esophagitis 04/19/2020   Elevated troponin I level 01/29/2020   Hematemesis 01/09/2020   Hospital discharge follow-up 11/26/2019   Peripheral neuropathy 08/11/2019   Obesity (BMI 30-39.9) 08/08/2019   Acute right hip pain 06/17/2019   Inability to ambulate due to hip 06/17/2019   CKD (chronic kidney disease) stage 4, GFR 15-29 ml/min (HCC)    Hypervolemia    Chronic diastolic CHF (congestive heart failure) (HCC)    Full code status 05/20/2019   Pleuritic chest pain    Acute on chronic heart failure with preserved ejection fraction (HFpEF) (HCC)    Chronic systolic CHF (congestive heart failure) (HCC) 03/01/2019   Iron  deficiency anemia 12/31/2018   Atopic dermatitis 11/24/2018   Screening for colon cancer 11/06/2017   Type 2 diabetes mellitus (HCC) 11/06/2017   Hidradenitis 06/09/2017   Atherosclerotic heart disease of native coronary artery without angina pectoris 06/09/2017   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   Obstructive sleep apnea, adult 06/09/2017   Cigarette nicotine  dependence with nicotine -induced disorder 06/09/2017   Diabetic polyneuropathy associated with type 2 diabetes mellitus (HCC) 06/09/2017   Allergic rhinitis due to pollen 06/09/2017   Mixed hyperlipidemia 06/09/2017   COPD (chronic obstructive pulmonary disease) (HCC) 06/09/2017   Wheezing 06/09/2017   Dysuria 06/09/2017   Essential hypertension 06/09/2017   Personal history of other malignant neoplasm of kidney 06/09/2017   Pain in right hip 06/09/2017   Secondary hyperparathyroidism, not elsewhere classified (HCC) 06/09/2017  Tinea corporis 06/09/2017    Orientation RESPIRATION BLADDER Height & Weight     Self, Situation, Place  Normal Incontinent Weight: 90.9 kg Height:  5' 11 (180.3 cm)  BEHAVIORAL SYMPTOMS/MOOD NEUROLOGICAL BOWEL  NUTRITION STATUS      Continent Diet (Please see discharge summary)  AMBULATORY STATUS COMMUNICATION OF NEEDS Skin   Limited Assist Verbally  (Dry, [place]ra[place], Ecchymosis, non-tenting)                       Personal Care Assistance Level of Assistance  Bathing, Dressing Bathing Assistance: Limited assistance   Dressing Assistance: Limited assistance     Functional Limitations Info  Hearing   Hearing Info: Impaired      SPECIAL CARE FACTORS FREQUENCY        PT Frequency: 5x per week OT Frequency: 5 x per week            Contractures      Additional Factors Info  Code Status, Allergies Code Status Info: Full Code Allergies Info: Penicillins  High - Dermatitis, Hives, Other (See Comments), Rash Comments  Codeine   No severity specified - Nausea And Vomiting           Current Medications (11/27/2023):  This is the current hospital active medication list Current Facility-Administered Medications  Medication Dose Route Frequency Provider Last Rate Last Admin   acetaminophen  (TYLENOL ) tablet 650 mg  650 mg Oral Q6H PRN Niu, Xilin, MD   650 mg at 11/25/23 0908   albuterol  (PROVENTIL ) (2.5 MG/3ML) 0.083% nebulizer solution 2.5 mg  2.5 mg Inhalation Q4H PRN Niu, Xilin, MD       amLODipine  (NORVASC ) tablet 5 mg  5 mg Oral BID Niu, Xilin, MD   5 mg at 11/27/23 2440   apixaban  (ELIQUIS ) tablet 5 mg  5 mg Oral BID Dezii, Alexandra, DO   5 mg at 11/27/23 1027   budesonide -glycopyrrolate -formoterol  (BREZTRI ) 160-9-4.8 MCG/ACT inhaler 2 puff  2 puff Inhalation BID Dezii, Jennelle Mocha, DO   2 puff at 11/25/23 0901   carvedilol  (COREG ) tablet 12.5 mg  12.5 mg Oral BID WC Niu, Xilin, MD   12.5 mg at 11/27/23 2536   cyanocobalamin  (VITAMIN B12) tablet 1,000 mcg  1,000 mcg Oral Daily Niu, Xilin, MD   1,000 mcg at 11/27/23 6440   dextromethorphan -guaiFENesin  (MUCINEX  DM) 30-600 MG per 12 hr tablet 1 tablet  1 tablet Oral BID PRN Niu, Xilin, MD   1 tablet at 11/27/23 0908   ferrous sulfate   tablet 325 mg  325 mg Oral QPM Niu, Xilin, MD   325 mg at 11/26/23 1719   insulin  aspart (novoLOG ) injection 0-20 Units  0-20 Units Subcutaneous TID WC Dezii, Alexandra, DO   11 Units at 11/27/23 3474   insulin  aspart (novoLOG ) injection 0-5 Units  0-5 Units Subcutaneous QHS Dezii, Alexandra, DO   5 Units at 11/26/23 2111   insulin  glargine-yfgn (SEMGLEE ) injection 25 Units  25 Units Subcutaneous Daily Dezii, Alexandra, DO   25 Units at 11/27/23 1107   ipratropium-albuterol  (DUONEB) 0.5-2.5 (3) MG/3ML nebulizer solution 3 mL  3 mL Nebulization BID Dezii, Alexandra, DO   3 mL at 11/27/23 0737   magic mouthwash w/lidocaine   5 mL Oral TID Dezii, Alexandra, DO   5 mL at 11/27/23 0907   methocarbamol  (ROBAXIN ) tablet 750 mg  750 mg Oral Q6H PRN Niu, Xilin, MD   750 mg at 11/23/23 2226   ondansetron  (ZOFRAN ) injection 4 mg  4 mg Intravenous Q8H  PRN Niu, Xilin, MD       pantoprazole  (PROTONIX ) EC tablet 40 mg  40 mg Oral Daily Niu, Xilin, MD   40 mg at 11/27/23 9604   rosuvastatin  (CRESTOR ) tablet 10 mg  10 mg Oral QHS Niu, Xilin, MD   10 mg at 11/26/23 2036   sodium bicarbonate  tablet 650 mg  650 mg Oral TID Niu, Xilin, MD   650 mg at 11/27/23 5409     Discharge Medications: Please see discharge summary for a list of discharge medications.  Relevant Imaging Results:  Relevant Lab Results:   Additional Information SSN: [id number]  Crayton Docker, RN

## 2023-11-27 NOTE — Plan of Care (Signed)
   Problem: Education: Goal: Ability to describe self-care measures that may prevent or decrease complications (Diabetes Survival Skills Education) will improve Outcome: Progressing   Problem: Coping: Goal: Ability to adjust to condition or change in health will improve Outcome: Progressing   Problem: Fluid Volume: Goal: Ability to maintain a balanced intake and output will improve Outcome: Progressing   Problem: Health Behavior/Discharge Planning: Goal: Ability to identify and utilize available resources and services will improve Outcome: Progressing Goal: Ability to manage health-related needs will improve Outcome: Progressing   Problem: Metabolic: Goal: Ability to maintain appropriate glucose levels will improve Outcome: Progressing   Problem: Nutritional: Goal: Maintenance of adequate nutrition will improve Outcome: Progressing
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
## 2023-11-28 DIAGNOSIS — I4892 Unspecified atrial flutter: Secondary | ICD-10-CM | POA: Diagnosis not present

## 2023-11-28 DIAGNOSIS — J441 Chronic obstructive pulmonary disease with (acute) exacerbation: Secondary | ICD-10-CM | POA: Diagnosis not present

## 2023-11-28 LAB — COMPREHENSIVE METABOLIC PANEL WITH GFR
ALT: 18 U/L (ref 0–44)
AST: 14 U/L — ABNORMAL LOW (ref 15–41)
Albumin: 2.3 g/dL — ABNORMAL LOW (ref 3.5–5.0)
Alkaline Phosphatase: 49 U/L (ref 38–126)
Anion gap: 9 (ref 5–15)
BUN: 119 mg/dL — ABNORMAL HIGH (ref 8–23)
CO2: 21 mmol/L — ABNORMAL LOW (ref 22–32)
Calcium: 8.5 mg/dL — ABNORMAL LOW (ref 8.9–10.3)
Chloride: 107 mmol/L (ref 98–111)
Creatinine, Ser: 3.95 mg/dL — ABNORMAL HIGH (ref 0.61–1.24)
GFR, Estimated: 16 mL/min — ABNORMAL LOW (ref >60)
Glucose, Bld: 205 mg/dL — ABNORMAL HIGH (ref 70–99)
Potassium: 4.9 mmol/L (ref 3.5–5.1)
Sodium: 137 mmol/L (ref 135–145)
Total Bilirubin: 0.6 mg/dL (ref 0.0–1.2)
Total Protein: 5.5 g/dL — ABNORMAL LOW (ref 6.5–8.1)

## 2023-11-28 LAB — CBC WITH DIFFERENTIAL/PLATELET
Abs Immature Granulocytes: 0.12 10*3/uL — ABNORMAL HIGH (ref 0.00–0.07)
Basophils Absolute: 0 10*3/uL (ref 0.0–0.1)
Basophils Relative: 0 %
Eosinophils Absolute: 0 10*3/uL (ref 0.0–0.5)
Eosinophils Relative: 0 %
HCT: 26.9 % — ABNORMAL LOW (ref 39.0–52.0)
Hemoglobin: 9 g/dL — ABNORMAL LOW (ref 13.0–17.0)
Immature Granulocytes: 2 %
Lymphocytes Relative: 14 %
Lymphs Abs: 1.1 10*3/uL (ref 0.7–4.0)
MCH: 29 pg (ref 26.0–34.0)
MCHC: 33.5 g/dL (ref 30.0–36.0)
MCV: 86.8 fL (ref 80.0–100.0)
Monocytes Absolute: 0.4 10*3/uL (ref 0.1–1.0)
Monocytes Relative: 5 %
Neutro Abs: 6.3 10*3/uL (ref 1.7–7.7)
Neutrophils Relative %: 79 %
Platelets: 192 10*3/uL (ref 150–400)
RBC: 3.1 MIL/uL — ABNORMAL LOW (ref 4.22–5.81)
RDW: 13.1 % (ref 11.5–15.5)
WBC: 7.9 10*3/uL (ref 4.0–10.5)
nRBC: 0 % (ref 0.0–0.2)

## 2023-11-28 LAB — PHOSPHORUS: Phosphorus: 4.7 mg/dL — ABNORMAL HIGH (ref 2.5–4.6)

## 2023-11-28 LAB — GLUCOSE, CAPILLARY
Glucose-Capillary: 148 mg/dL — ABNORMAL HIGH (ref 70–99)
Glucose-Capillary: 247 mg/dL — ABNORMAL HIGH (ref 70–99)
Glucose-Capillary: 197 mg/dL — ABNORMAL HIGH (ref 70–99)
Glucose-Capillary: 256 mg/dL — ABNORMAL HIGH (ref 70–99)

## 2023-11-28 LAB — MAGNESIUM: Magnesium: 2.3 mg/dL (ref 1.7–2.4)

## 2023-11-28 MED ORDER — CARVEDILOL 3.125 MG PO TABS
3.1250 mg | ORAL_TABLET | Freq: Two times a day (BID) | ORAL | Status: DC
  Administered 2023-11-29 – 2023-12-18 (×40): 3.125 mg via ORAL
  Filled 2023-11-28 (×41): qty 1

## 2023-11-28 NOTE — TOC Progression Note (Signed)
 Transition of Care [facility]) - Progression Note    Patient Details  Name: Larry Callahan MRN: [id number] Date of Birth: [birth year]  [facility] CM/SW Contact  Crayton Docker, RN 11/28/2023, 1:03 PM  Clinical Narrative:     CM discussed case with Alvera Austria, Admissions Director, [facility] onsite today. CM and [place] to patient's room. CM and patient discussed SNF preferences at visit. Patient amenable to [facility] for discharge in morning, 11/28/2023 at 1030. CM alert to [employer], CMA and Leoma Raja, CMA  regarding initiating auth.   Expected Discharge Plan and Services    SNF   Social Determinants of Health (SDOH) Interventions SDOH Screenings   Food Insecurity: No Food Insecurity (11/23/2023)  Housing: High Risk (11/23/2023)  Transportation Needs: No Transportation Needs (11/23/2023)  Utilities: Not At Risk (11/23/2023)  Alcohol Screen: Low Risk  (03/29/2022)  Depression (PHQ2-9): Low Risk  (09/13/2022)  Financial Resource Strain: Low Risk  (11/16/2023)  Social Connections: Unknown (11/23/2023)  Tobacco Use: High Risk (11/22/2023)    Readmission Risk Interventions    12/01/2021   12:25 PM 08/07/2021    2:36 PM 06/28/2021    2:26 PM  Readmission Risk Prevention Plan  Transportation Screening Complete Complete Complete  Medication Review [employer]) Complete Complete Complete  PCP or Specialist appointment within 3-5 days of discharge Complete Complete Complete  HRI or Home Care Consult Complete Complete Complete  SW Recovery Care/Counseling Consult Complete Complete Complete  Palliative Care Screening Complete Complete Not Applicable  Skilled Nursing Facility Complete Complete Not Applicable
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
## 2023-11-28 NOTE — Progress Notes (Signed)
 [facility]  ROUNDING NOTE   Subjective:  [name] is a 68 y.o. male with past medical conditions including CHF, COPD, CAD, stroke, diabetes, CKDIV and hypertension, who was admitted to [facility] on 11/04/2023 for COPD exacerbation (HCC) [J44.1]  Update: Patient seen in bed resting. Denies shortness of breath, denies pain.  No edema   Objective:  Vital signs in last 24 hours:  Temp:  [97.5 F (36.4 C)-97.7 F (36.5 C)] 97.7 F (36.5 C) (06/17 0824) Pulse Rate:  [58-59] 59 (06/17 0824) Resp:  [16-18] 18 (06/17 0824) BP: (140-148)/(67-71) 146/68 (06/17 0824) SpO2:  [95 %-99 %] 98 % (06/17 0824) Weight:  [91.3 kg] 91.3 kg (06/17 0500)  Weight change: 0.4 kg Filed Weights   11/24/23 0618 11/27/23 0500 11/28/23 0500  Weight: 89.8 kg 90.9 kg 91.3 kg    Intake/Output: I/O last 3 completed shifts: In: 740 [P.O.:740] Out: 2000 [Urine:2000]   Intake/Output this shift:  Total I/O In: 240 [P.O.:240] Out: 400 [Urine:400]  Physical Exam: General: NAD,   Head: Normocephalic, atraumatic. Moist oral mucosal membranes  Eyes: Anicteric,    Neck: Supple,    Lungs:  Normal breathing effort on room air  Heart: Regular rate and rhythm  Abdomen:  Soft, nontender,   Extremities:  Trace peripheral edema.  Neurologic: Nonfocal, moving all four extremities  Skin: No lesions  Access: None    Basic Metabolic Panel: Recent Labs  Lab 11/24/23 0524 11/25/23 0320 11/26/23 0612 11/27/23 0310 11/28/23 0500  NA 133* 132* 131* 133* 137  K 4.9 5.0 4.8 4.9 4.9  CL 105 104 103 105 107  CO2 18* 18* 18* 17* 21*  GLUCOSE 317* 274* 311* 345* 205*  BUN 99* 103* 112* 118* 119*  CREATININE 4.73* 4.56* 4.39* 4.00* 3.95*  CALCIUM  8.0* 8.1* 8.0* 8.1* 8.5*  MG 2.2 2.0 2.1 2.2 2.3  PHOS 5.6* 5.0* 4.9* 4.7* 4.7*    Liver Function Tests: Recent Labs  Lab 11/24/23 0524 11/25/23 0320 11/26/23 0612 11/27/23 0310 11/28/23 0500  AST 14* 14* 15 18 14*  ALT 10 13 14 18 18    ALKPHOS 54 49 51 51 49  BILITOT 0.7 0.7 0.6 0.7 0.6  PROT 5.8* 5.8* 5.7* 6.0* 5.5*  ALBUMIN  2.2* 2.3* 2.3* 2.3* 2.3*   No results for input(s): LIPASE, AMYLASE in the last 168 hours. No results for input(s): AMMONIA in the last 168 hours.  CBC: Recent Labs  Lab 11/24/23 0524 11/25/23 0320 11/26/23 0612 11/27/23 0310 11/28/23 0500  WBC 6.3 4.0 4.1 4.3 7.9  NEUTROABS 5.5 3.2 2.9 3.7 6.3  HGB 8.1* 8.4* 8.8* 8.7* 9.0*  HCT 25.0* 26.2* 26.5* 27.0* 26.9*  MCV 89.3 88.5 87.2 87.1 86.8  PLT 182 190 190 198 192    Cardiac Enzymes: No results for input(s): CKTOTAL, CKMB, CKMBINDEX, TROPONINI in the last 168 hours.  BNP: Invalid input(s): POCBNP  CBG: Recent Labs  Lab 11/27/23 1129 11/27/23 1553 11/27/23 2053 11/28/23 0826 11/28/23 1144  GLUCAP 301* 168* 209* 148* 247*    Microbiology: Results for orders placed or performed during the hospital encounter of 11/22/23  Resp panel by RT-PCR (RSV, Flu A&B, Covid) Anterior Nasal Swab     Status: None   Collection Time: 11/22/23  8:26 PM   Specimen: Anterior Nasal Swab  Result Value Ref Range Status   SARS Coronavirus 2 by RT PCR NEGATIVE NEGATIVE Final    Comment: (NOTE) SARS-CoV-2 target nucleic acids are NOT DETECTED.  The SARS-CoV-2 RNA is  generally detectable in upper respiratory specimens during the acute phase of infection. The lowest concentration of SARS-CoV-2 viral copies this assay can detect is 138 copies/mL. A negative result does not preclude SARS-Cov-2 infection and should not be used as the sole basis for treatment or other patient management decisions. A negative result may occur with  improper specimen collection/handling, submission of specimen other than nasopharyngeal swab, presence of viral mutation(s) within the areas targeted by this assay, and inadequate number of viral copies(<138 copies/mL). A negative result must be combined with clinical observations, patient history, and  epidemiological information. The expected result is Negative.  Fact Sheet for Patients:  BloggerCourse.com  Fact Sheet for Healthcare Providers:  SeriousBroker.it  This test is no t yet approved or cleared by the [place]  FDA and  has been authorized for detection and/or diagnosis of SARS-CoV-2 by FDA under an Emergency Use Authorization (EUA). This EUA will remain  in effect (meaning this test can be used) for the duration of the COVID-19 declaration under Section 564(b)(1) of the Act, 21 U.S.C.section 360bbb-3(b)(1), unless the authorization is terminated  or revoked sooner.       Influenza A by PCR NEGATIVE NEGATIVE Final   Influenza B by PCR NEGATIVE NEGATIVE Final    Comment: (NOTE) The Xpert Xpress SARS-CoV-2/FLU/RSV plus assay is intended as an aid in the diagnosis of influenza from Nasopharyngeal swab specimens and should not be used as a sole basis for treatment. Nasal washings and aspirates are unacceptable for Xpert Xpress SARS-CoV-2/FLU/RSV testing.  Fact Sheet for Patients: BloggerCourse.com  Fact Sheet for Healthcare Providers: SeriousBroker.it  This test is not yet approved or cleared by the [place]  FDA and has been authorized for detection and/or diagnosis of SARS-CoV-2 by FDA under an Emergency Use Authorization (EUA). This EUA will remain in effect (meaning this test can be used) for the duration of the COVID-19 declaration under Section 564(b)(1) of the Act, 21 U.S.C. section 360bbb-3(b)(1), unless the authorization is terminated or revoked.     Resp Syncytial Virus by PCR NEGATIVE NEGATIVE Final    Comment: (NOTE) Fact Sheet for Patients: BloggerCourse.com  Fact Sheet for Healthcare Providers: SeriousBroker.it  This test is not yet approved or cleared by the [place]  FDA and has been  authorized for detection and/or diagnosis of SARS-CoV-2 by FDA under an Emergency Use Authorization (EUA). This EUA will remain in effect (meaning this test can be used) for the duration of the COVID-19 declaration under Section 564(b)(1) of the Act, 21 U.S.C. section 360bbb-3(b)(1), unless the authorization is terminated or revoked.  Performed at [facility], [address] [place], [place] [address]     Coagulation Studies: No results for input(s): LABPROT, INR in the last 72 hours.  Urinalysis: No results for input(s): COLORURINE, LABSPEC, PHURINE, GLUCOSEU, HGBUR, BILIRUBINUR, KETONESUR, PROTEINUR, UROBILINOGEN, NITRITE, LEUKOCYTESUR in the last 72 hours.  Invalid input(s): APPERANCEUR    Imaging: No results found.   Medications:     amLODipine   5 mg Oral BID   apixaban   5 mg Oral BID   budesonide -glycopyrrolate -formoterol   2 puff Inhalation BID   carvedilol   3.125 mg Oral BID WC   cyanocobalamin   1,000 mcg Oral Daily   ferrous sulfate   325 mg Oral QPM   insulin  aspart  0-20 Units Subcutaneous TID WC   insulin  aspart  0-5 Units Subcutaneous QHS   insulin  glargine-yfgn  25 Units Subcutaneous Daily   magic mouthwash w/lidocaine   5 mL Oral TID   pantoprazole   40 mg Oral Daily   rosuvastatin   10 mg Oral QHS   sodium bicarbonate   650 mg Oral TID   acetaminophen , albuterol , dextromethorphan -guaiFENesin , methocarbamol , ondansetron  (ZOFRAN ) IV  Assessment/ Plan:  [name] is a 68 y.o.  male with past medical conditions including CHF, COPD, CAD, stroke, diabetes, CKDIV and hypertension, who was admitted to [facility] on 11/04/2023 for COPD exacerbation (HCC) [J44.1]     Acute kidney injury on chronic kidney disease IV.  Patient appears to have recurrent acute kidney injury.  Has been on and off diuretics.  Baseline creatinine appears to be 3.25 with GFR 20 on 10/17/23.  History of solitary kidney. Patient has history of  multiple cysts in the right kidney with largest being 7.9 cm x 8.8 cm. - Creatinine slightly improved today to 3.95.  Urine output good at 1600. -Patient has expressed multiple times that he would not want to take chronic dialysis.   -Continue supportive care.   2.  Hypertension Currently on amlodipine  and carvedilol .   Blood pressure control is acceptable.   3. Anemia of chronic kidney disease Patient received blood transfusion this admission.  11/11/2023, 11/15/2023. Most recent hemoglobin 9.0, acceptable.   4. Diabetes mellitus type II with chronic kidney disease/renal manifestations: insulin  dependent. Home regimen includes Lantus  and humalog . Most recent hemoglobin A1c is 6.7 on 09/18/23.  Glucose management as per primary team.                 LOS: 0 Trevan Messman 6/17/20252:02 PM

## 2023-11-28 NOTE — Plan of Care (Signed)
 The patient is alert and oriented x4. Speech is clear, able to voice needs to nursing staff. No s/s of respiratory distress noted.

## 2023-11-29 ENCOUNTER — Inpatient Hospital Stay: Admitting: Nurse Practitioner

## 2023-11-29 DIAGNOSIS — N189 Chronic kidney disease, unspecified: Secondary | ICD-10-CM

## 2023-11-29 DIAGNOSIS — Z794 Long term (current) use of insulin: Secondary | ICD-10-CM

## 2023-11-29 DIAGNOSIS — Z59819 Housing instability, housed unspecified: Secondary | ICD-10-CM | POA: Diagnosis not present

## 2023-11-29 DIAGNOSIS — Z8673 Personal history of transient ischemic attack (TIA), and cerebral infarction without residual deficits: Secondary | ICD-10-CM

## 2023-11-29 DIAGNOSIS — E1121 Type 2 diabetes mellitus with diabetic nephropathy: Secondary | ICD-10-CM

## 2023-11-29 DIAGNOSIS — J441 Chronic obstructive pulmonary disease with (acute) exacerbation: Secondary | ICD-10-CM | POA: Diagnosis not present

## 2023-11-29 DIAGNOSIS — D508 Other iron deficiency anemias: Secondary | ICD-10-CM

## 2023-11-29 DIAGNOSIS — I5022 Chronic systolic (congestive) heart failure: Secondary | ICD-10-CM

## 2023-11-29 DIAGNOSIS — K148 Other diseases of tongue: Secondary | ICD-10-CM

## 2023-11-29 DIAGNOSIS — N179 Acute kidney failure, unspecified: Secondary | ICD-10-CM

## 2023-11-29 LAB — GLUCOSE, CAPILLARY
Glucose-Capillary: 109 mg/dL — ABNORMAL HIGH (ref 70–99)
Glucose-Capillary: 117 mg/dL — ABNORMAL HIGH (ref 70–99)
Glucose-Capillary: 169 mg/dL — ABNORMAL HIGH (ref 70–99)
Glucose-Capillary: 175 mg/dL — ABNORMAL HIGH (ref 70–99)

## 2023-11-29 MED ORDER — CARVEDILOL 3.125 MG PO TABS: 3.1250 mg | ORAL_TABLET | Freq: Two times a day (BID) | ORAL | 0 refills | Status: DC

## 2023-11-29 MED ORDER — SODIUM BICARBONATE 650 MG PO TABS: 650.0000 mg | ORAL_TABLET | Freq: Three times a day (TID) | ORAL | 0 refills | Status: DC

## 2023-11-29 MED ORDER — INSULIN ASPART 100 UNIT/ML IJ SOLN: 2.0000 [IU] | Freq: Three times a day (TID) | INTRAMUSCULAR | 0 refills | Status: DC

## 2023-11-29 MED ORDER — SIMETHICONE 80 MG PO CHEW
80.0000 mg | CHEWABLE_TABLET | Freq: Four times a day (QID) | ORAL | Status: DC | PRN
  Administered 2023-11-29 – 2023-11-30 (×2): 80 mg via ORAL
  Filled 2023-11-29 (×2): qty 1

## 2023-11-29 MED ORDER — INSULIN GLARGINE-YFGN 100 UNIT/ML ~~LOC~~ SOLN: 25.0000 [IU] | Freq: Every day | SUBCUTANEOUS | 0 refills | Status: DC

## 2023-11-29 MED ORDER — MAGIC MOUTHWASH W/LIDOCAINE: 5.0000 mL | Freq: Three times a day (TID) | ORAL | 0 refills | Status: DC

## 2023-11-29 NOTE — Discharge Summary (Signed)
 Physician Discharge Summary   Patient: Larry Callahan MRN: [id number] DOB: 1956/04/14  Admit date:     11/22/2023  Discharge date: 11/29/23  Discharge Physician: Verla Glaze   PCP: Laurence Pons, NP   Recommendations at discharge:    Follow up at [facility] health care rehab Follow-up [facility] nephrology  Discharge Diagnoses: Principal Problem:   Housing situation unstable Active Problems:   Tongue lesion   Chronic systolic CHF (congestive heart failure) (HCC)   Acute kidney injury superimposed on CKD (HCC)   Essential hypertension   Atrial flutter (HCC)   Type II diabetes mellitus with renal manifestations (HCC)   HLD (hyperlipidemia)   COPD exacerbation (HCC)   CAD (coronary artery disease)   Metabolic acidosis, normal anion gap (NAG)   Stroke (HCC)   Iron  deficiency anemia   Hx of completed stroke   Hospital Course:  68 year old male with multiple medical problems including: heart failure with reduced EF 40% secondary to nonischemic cardiomyopathy, a flutter status post ablation on Eliquis , CKD stage IV, hypertension, type 2 diabetes, COPD, OSA, HLD, nonobstructive CAD, CVA.  Patient was recently admitted to the hospital and discharged on 6/2.  During that admission he was seen for COPD exacerbation and AKI superimposed on CKD.  He was discharged home in the custody of his nephew with whom he resides.  He then presented to the ED again on 6/4 requesting SNF placement.  He remained in the ED for 4 days pending SNF arrangements.  Ultimately SNF was found to be cost prohibitive and the patient discharged back home to his nephew on 6/8.  He presents again to the ED on this admission requesting long-term placement.  He reports he is no longer safe at home with his nephew but can also not afford skilled nursing facility placement.  ED physician had concerns of tachypnea with expiratory wheeze and requested admission for COPD exacerbation.  Patient's respiratory status appeared  to be at baseline and he is medically cleared for discharge.  Stay was then prolonged pending placement.  Ultimately patient was approved to discharge to skilled nursing facility which is arranged for 6/18.     Assessment and Plan: * Housing situation unstable TOC arranged rehab.  Tongue lesion Likely aphthous ulcer.  Magic mouthwash.  Chronic systolic CHF (congestive heart failure) (HCC) Patient euvolemic.  Holding off on diuretics secondary to kidney function.  Patient not short of breath and breathing comfortably on room air.  Continue Coreg .  Last EF shows a improved ejection fraction of 50%.  Acute kidney injury superimposed on CKD (HCC) Baseline creatinine around 3.5.  Patient creatinine was up at 4.7.  Holding diuretic.  Last creatinine 3.95.  Check a BMP in 1 week.  Type II diabetes mellitus with renal manifestations (HCC) Continue Semglee  insulin  with sliding scale.  Atrial flutter (HCC) Status post ablation.  Continue Eliquis  for anticoagulation.  On lower dose Coreg .  Essential hypertension Continue Coreg   COPD exacerbation (HCC) Resolved.  Completed 5-day course of prednisone .  On room air.  Stroke [facility]) Continue Eliquis   Iron  deficiency anemia Last hemoglobin 9         Consultants: Nephrology Procedures performed: None Disposition: Rehabilitation facility Diet recommendation:  Cardiac and Carb modified diet DISCHARGE MEDICATION: Allergies as of 11/29/2023       Reactions   Penicillins Rash, Dermatitis, Hives, Other (See Comments)   Other reaction(s): HIVES   Codeine  Nausea And Vomiting        Medication List     STOP  taking these medications    Banophen  25 MG capsule Generic drug: diphenhydrAMINE    furosemide  40 MG tablet Commonly known as: LASIX    hydrALAZINE  100 MG tablet Commonly known as: APRESOLINE    Insulin  lispro 100 UNIT/ML Commonly known as: HUMALOG  Junior KwikPen   metolazone  2.5 MG tablet Commonly known as: ZAROXOLYN     Novofine Pen Needle 32G X 6 MM Misc Generic drug: Insulin  Pen Needle   PERIGUARD EX   potassium chloride  SA 20 MEQ tablet Commonly known as: KLOR-CON  M   Toujeo  Max SoloStar 300 UNIT/ML Solostar Pen Generic drug: insulin  glargine (2 Unit Dial )       TAKE these medications    Accu-Chek Guide test strip Generic drug: glucose blood Use to check blood sugars 4 times a day E11.65   Accu-Chek Softclix Lancets lancets Use to check blood sugars 4 times a day   E11.65   acetaminophen  325 MG tablet Commonly known as: TYLENOL  Take 2 tablets (650 mg total) by mouth every 6 (six) hours as needed for mild pain or moderate pain (or Fever >/= 101).   albuterol  108 (90 Base) MCG/ACT inhaler Commonly known as: VENTOLIN  HFA Inhale 1-2 puffs into the lungs every 4 (four) hours as needed for wheezing or shortness of breath.   amLODipine  5 MG tablet Commonly known as: NORVASC  Take 1 tablet (5 mg total) by mouth 2 (two) times daily. Skip the dose if systolic BP less than 140 mmHg   apixaban  5 MG Tabs tablet Commonly known as: Eliquis  Take 1 tablet (5 mg total) by mouth 2 (two) times daily.   Breztri  Aerosphere 160-9-4.8 MCG/ACT Aero inhaler Generic drug: budesonide -glycopyrrolate -formoterol  Inhale 2 puffs into the lungs 2 (two) times daily.   carvedilol  3.125 MG tablet Commonly known as: COREG  Take 1 tablet (3.125 mg total) by mouth 2 (two) times daily with a meal. What changed:  medication strength how much to take when to take this   cyanocobalamin  1000 MCG tablet Take 1 tablet (1,000 mcg total) by mouth daily.   ferrous sulfate  325 (65 FE) MG tablet Take 325 mg by mouth every evening.   insulin  aspart 100 UNIT/ML injection Commonly known as: novoLOG  Inject 2 Units into the skin 3 (three) times daily with meals. If eating and Blood Glucose (BG) 80 or higher inject 2 units for meal coverage and add correction dose per scale. If not eating, correction dose only. BG <150= 0 unit;  BG 150-200= 1 unit; BG 201-250= 2 unit; BG 251-300= 3 unit; BG 301-350= 4 unit; BG 351-400= 5 unit; BG >400= 6 unit and Call Primary Care.   insulin  glargine-yfgn 100 UNIT/ML injection Commonly known as: SEMGLEE  Inject 0.25 mLs (25 Units total) into the skin daily.   ipratropium-albuterol  0.5-2.5 (3) MG/3ML Soln Commonly known as: DUONEB Take 3 mLs by nebulization every 6 (six) hours as needed.   magic mouthwash w/lidocaine  Soln Take 5 mLs by mouth 3 (three) times daily for 7 days. Suspension contains equal amounts of Maalox Extra Strength, nystatin, diphenhydramine  and lidocaine .   methocarbamol  750 MG tablet Commonly known as: ROBAXIN  Take 1 tablet (750 mg total) by mouth every 6 (six) hours as needed for muscle spasms.   pantoprazole  40 MG tablet Commonly known as: PROTONIX  Take 1 tablet (40 mg total) by mouth daily.   rosuvastatin  10 MG tablet Commonly known as: CRESTOR  Take 1 tablet (10 mg total) by mouth at bedtime.   sodium bicarbonate  650 MG tablet Take 1 tablet (650 mg total) by mouth 3 (  three) times daily. What changed: See the new instructions.   True Metrix Air Glucose Meter Devi 1 Device by Does not apply route 2 (two) times daily.        Contact information for follow-up providers     Laurence Pons, NP Follow up.   Specialty: Nurse Practitioner Why: hospital follow up Contact information: [address] [place] [address] [phone]              Contact information for after-discharge care     Destination     [facility] .   Service: Skilled Nursing Contact information: [address] [place]  [phone]                    Discharge Exam: Cleavon Curls Weights   11/27/23 0500 11/28/23 0500 11/29/23 0404  Weight: 90.9 kg 91.3 kg (P) 92.5 kg   Physical Exam HENT:     Head: Normocephalic.   Eyes:     General: Lids are normal.     Conjunctiva/sclera: Conjunctivae normal.     Cardiovascular:     Rate and Rhythm: Normal rate and regular rhythm.     Heart sounds: Normal heart sounds, S1 normal and S2 normal.  Pulmonary:     Breath sounds: No decreased breath sounds, wheezing, rhonchi or rales.  Abdominal:     Palpations: Abdomen is soft.     Tenderness: There is no abdominal tenderness.   Musculoskeletal:     Right lower leg: No swelling.     Left lower leg: No swelling.   Skin:    General: Skin is warm.     Findings: No rash.   Neurological:     Mental Status: He is alert.      Condition at discharge: stable  The results of significant diagnostics from this hospitalization (including imaging, microbiology, ancillary and laboratory) are listed below for reference.   Imaging Studies: DG Chest 2 View Result Date: 11/22/2023 CLINICAL DATA:  Shortness of breath. EXAM: CHEST - 2 VIEW COMPARISON:  Nov 07, 2023. FINDINGS: Stable cardiomediastinal silhouette. Mild bibasilar subsegmental atelectasis is noted with small pleural effusions. Bony thorax is unremarkable. IMPRESSION: Mild bibasilar subsegmental atelectasis with small pleural effusions. Electronically Signed   By: Rosalene Colon M.D.   On: 11/22/2023 17:40   DG Chest Port 1 View Result Date: 11/07/2023 CLINICAL DATA:  [id number] Chest pain [id number] EXAM: PORTABLE CHEST - 1 VIEW COMPARISON:  11/04/2023 FINDINGS: Right lateral pleural thickening and blunting of the right lateral costophrenic angle. Left retrocardiac atelectasis/consolidation. Heart size and mediastinal contours are within normal limits. Aortic Atherosclerosis (ICD10-170.0). Progressive mild thoracic dextroscoliosis. IMPRESSION: 1. Left retrocardiac atelectasis/consolidation. 2. Right lateral pleural thickening and blunting of the right lateral costophrenic angle. Electronically Signed   By: Nicoletta Barrier M.D.   On: 11/07/2023 14:06   ECHOCARDIOGRAM COMPLETE Result Date: 11/05/2023    ECHOCARDIOGRAM REPORT   Patient Name:   NEFTALI ABAIR  [facility] Date of Exam: 11/05/2023 Medical Rec #:  [id number]              Height:       71.0 in Accession #:    [id number]             Weight:       191.1 lb Date of Birth:  [birth year]              BSA:          2.068 m Patient Age:  68 years               BP:           131/63 mmHg Patient Gender: M                      HR:           74 bpm. Exam Location:  [name] Procedure: 2D Echo, Cardiac Doppler and Color Doppler (Both Spectral and Color            Flow Doppler were utilized during procedure). Indications:     CHF I50.21  History:         Patient has prior history of Echocardiogram examinations, most                  recent 08/08/2021.  Sonographer:     Clenton Czech RDCS, FASE Referring Phys:  [id number] Avi Body Diagnosing Phys: Antonette Batters MD IMPRESSIONS  1. Left ventricular ejection fraction, by estimation, is 50 to 55%. The left ventricle has low normal function. The left ventricle has no regional wall motion abnormalities. There is mild left ventricular hypertrophy. Left ventricular diastolic parameters are consistent with Grade I diastolic dysfunction (impaired relaxation).  2. Right ventricular systolic function is normal. The right ventricular size is normal.  3. The mitral valve is normal in structure. Trivial mitral valve regurgitation.  4. The aortic valve is grossly normal. Aortic valve regurgitation is not visualized. FINDINGS  Left Ventricle: Left ventricular ejection fraction, by estimation, is 50 to 55%. The left ventricle has low normal function. The left ventricle has no regional wall motion abnormalities. Strain was performed and the global longitudinal strain is indeterminate. Global longitudinal strain performed but not reported based on interpreter judgement due to suboptimal tracking. The left ventricular internal cavity size was normal in size. There is mild left ventricular hypertrophy. Left ventricular diastolic parameters are consistent with Grade I diastolic dysfunction  (impaired relaxation). Right Ventricle: The right ventricular size is normal. No increase in right ventricular wall thickness. Right ventricular systolic function is normal. Left Atrium: Left atrial size was normal in size. Right Atrium: Right atrial size was normal in size. Pericardium: There is no evidence of pericardial effusion. Mitral Valve: The mitral valve is normal in structure. Trivial mitral valve regurgitation. Tricuspid Valve: The tricuspid valve is normal in structure. Tricuspid valve regurgitation is trivial. Aortic Valve: The aortic valve is grossly normal. Aortic valve regurgitation is not visualized. Aortic regurgitation PHT measures 106 msec. Aortic valve peak gradient measures 13.0 mmHg. Pulmonic Valve: The pulmonic valve was normal in structure. Pulmonic valve regurgitation is not visualized. Aorta: The ascending aorta was not well visualized. IAS/Shunts: No atrial level shunt detected by color flow Doppler. Additional Comments: 3D was performed not requiring image post processing on an independent workstation and was indeterminate.  LEFT VENTRICLE PLAX 2D LVIDd:         4.70 cm      Diastology LVIDs:         3.40 cm      LV e' medial:    8.27 cm/s LV PW:         1.30 cm      LV E/e' medial:  9.4 LV IVS:        1.30 cm      LV e' lateral:   9.68 cm/s LVOT diam:     2.20 cm      LV E/e' lateral: 8.1 LV SV:  94 LV SV Index:   45 LVOT Area:     3.80 cm  LV Volumes (MOD) LV vol d, MOD A4C: 116.0 ml LV vol s, MOD A4C: 46.6 ml LV SV MOD A4C:     116.0 ml RIGHT VENTRICLE RV Basal diam:  3.40 cm RV S prime:     10.40 cm/s TAPSE (M-mode): 2.4 cm LEFT ATRIUM             Index        RIGHT ATRIUM           Index LA diam:        4.00 cm 1.93 cm/m   RA Area:     17.50 cm LA Vol (A2C):   75.3 ml 36.41 ml/m  RA Volume:   44.40 ml  21.47 ml/m LA Vol (A4C):   53.1 ml 25.67 ml/m LA Biplane Vol: 63.2 ml 30.56 ml/m  AORTIC VALVE                 PULMONIC VALVE AV Area (Vmax): 2.20 cm     PV Vmax:        1.20 m/s AV Vmax:        180.00 cm/s  PV Peak grad:  5.8 mmHg AV Peak Grad:   13.0 mmHg LVOT Vmax:      104.00 cm/s LVOT Vmean:     67.400 cm/s LVOT VTI:       0.247 m AI PHT:         106 msec  AORTA Ao Root diam: 3.70 cm MITRAL VALVE               TRICUSPID VALVE MV Area (PHT): 3.58 cm    TR Peak grad:   27.7 mmHg MV Decel Time: 212 msec    TR Vmax:        263.00 cm/s MV E velocity: 78.10 cm/s MV A velocity: 92.80 cm/s  SHUNTS MV E/A ratio:  0.84        Systemic VTI:  0.25 m                            Systemic Diam: 2.20 cm Antonette Batters MD Electronically signed by Antonette Batters MD Signature Date/Time: 11/05/2023/9:41:53 PM    Final    DG Chest Portable 1 View Result Date: 11/04/2023 CLINICAL DATA:  Shortness of breath, cough EXAM: PORTABLE CHEST 1 VIEW COMPARISON:  10/14/2023 FINDINGS: Stable cardiomegaly with similar vascular congestion. No definite acute edema pattern or CHF. No focal pneumonia, collapse or consolidation. No large effusion or pneumothorax. Aorta atherosclerotic. Degenerative changes of the spine. Overall stable exam. IMPRESSION: Cardiomegaly with vascular congestion. No acute process by portable radiography. Electronically Signed   By: Melven Stable.  Shick M.D.   On: 11/04/2023 15:13    Microbiology: Results for orders placed or performed during the hospital encounter of 11/22/23  Resp panel by RT-PCR (RSV, Flu A&B, Covid) Anterior Nasal Swab     Status: None   Collection Time: 11/22/23  8:26 PM   Specimen: Anterior Nasal Swab  Result Value Ref Range Status   SARS Coronavirus 2 by RT PCR NEGATIVE NEGATIVE Final    Comment: (NOTE) SARS-CoV-2 target nucleic acids are NOT DETECTED.  The SARS-CoV-2 RNA is generally detectable in upper respiratory specimens during the acute phase of infection. The lowest concentration of SARS-CoV-2 viral copies this assay can detect is 138 copies/mL. A negative result does not preclude  SARS-Cov-2 infection and should not be used as the sole basis for  treatment or other patient management decisions. A negative result may occur with  improper specimen collection/handling, submission of specimen other than nasopharyngeal swab, presence of viral mutation(s) within the areas targeted by this assay, and inadequate number of viral copies(<138 copies/mL). A negative result must be combined with clinical observations, patient history, and epidemiological information. The expected result is Negative.  Fact Sheet for Patients:  BloggerCourse.com  Fact Sheet for Healthcare Providers:  SeriousBroker.it  This test is no t yet approved or cleared by the [place]  FDA and  has been authorized for detection and/or diagnosis of SARS-CoV-2 by FDA under an Emergency Use Authorization (EUA). This EUA will remain  in effect (meaning this test can be used) for the duration of the COVID-19 declaration under Section 564(b)(1) of the Act, 21 U.S.C.section 360bbb-3(b)(1), unless the authorization is terminated  or revoked sooner.       Influenza A by PCR NEGATIVE NEGATIVE Final   Influenza B by PCR NEGATIVE NEGATIVE Final    Comment: (NOTE) The Xpert Xpress SARS-CoV-2/FLU/RSV plus assay is intended as an aid in the diagnosis of influenza from Nasopharyngeal swab specimens and should not be used as a sole basis for treatment. Nasal washings and aspirates are unacceptable for Xpert Xpress SARS-CoV-2/FLU/RSV testing.  Fact Sheet for Patients: BloggerCourse.com  Fact Sheet for Healthcare Providers: SeriousBroker.it  This test is not yet approved or cleared by the [place]  FDA and has been authorized for detection and/or diagnosis of SARS-CoV-2 by FDA under an Emergency Use Authorization (EUA). This EUA will remain in effect (meaning this test can be used) for the duration of the COVID-19 declaration under Section 564(b)(1) of the Act, 21  U.S.C. section 360bbb-3(b)(1), unless the authorization is terminated or revoked.     Resp Syncytial Virus by PCR NEGATIVE NEGATIVE Final    Comment: (NOTE) Fact Sheet for Patients: BloggerCourse.com  Fact Sheet for Healthcare Providers: SeriousBroker.it  This test is not yet approved or cleared by the [place]  FDA and has been authorized for detection and/or diagnosis of SARS-CoV-2 by FDA under an Emergency Use Authorization (EUA). This EUA will remain in effect (meaning this test can be used) for the duration of the COVID-19 declaration under Section 564(b)(1) of the Act, 21 U.S.C. section 360bbb-3(b)(1), unless the authorization is terminated or revoked.  Performed at [facility], [address] [place], [place] [address]     Labs: CBC: Recent Labs  Lab 11/24/23 0524 11/25/23 0320 11/26/23 0612 11/27/23 0310 11/28/23 0500  WBC 6.3 4.0 4.1 4.3 7.9  NEUTROABS 5.5 3.2 2.9 3.7 6.3  HGB 8.1* 8.4* 8.8* 8.7* 9.0*  HCT 25.0* 26.2* 26.5* 27.0* 26.9*  MCV 89.3 88.5 87.2 87.1 86.8  PLT 182 190 190 198 192   Basic Metabolic Panel: Recent Labs  Lab 11/24/23 0524 11/25/23 0320 11/26/23 0612 11/27/23 0310 11/28/23 0500  NA 133* 132* 131* 133* 137  K 4.9 5.0 4.8 4.9 4.9  CL 105 104 103 105 107  CO2 18* 18* 18* 17* 21*  GLUCOSE 317* 274* 311* 345* 205*  BUN 99* 103* 112* 118* 119*  CREATININE 4.73* 4.56* 4.39* 4.00* 3.95*  CALCIUM  8.0* 8.1* 8.0* 8.1* 8.5*  MG 2.2 2.0 2.1 2.2 2.3  PHOS 5.6* 5.0* 4.9* 4.7* 4.7*   Liver Function Tests: Recent Labs  Lab 11/24/23 0524 11/25/23 0320 11/26/23 0612 11/27/23 0310 11/28/23 0500  AST 14* 14* 15 18 14*  ALT  10 13 14 18 18   ALKPHOS 54 49 51 51 49  BILITOT 0.7 0.7 0.6 0.7 0.6  PROT 5.8* 5.8* 5.7* 6.0* 5.5*  ALBUMIN  2.2* 2.3* 2.3* 2.3* 2.3*   CBG: Recent Labs  Lab 11/28/23 0826 11/28/23 1144 11/28/23 1659 11/28/23 2052 11/29/23 0806  GLUCAP 148*  247* 197* 256* 109*    Discharge time spent: greater than 30 minutes.  Signed: Verla Glaze, MD [facility] 11/29/2023

## 2023-11-29 NOTE — Assessment & Plan Note (Addendum)
 Continue Coreg , Avapro  and Lasix 

## 2023-11-29 NOTE — Assessment & Plan Note (Addendum)
 And hyperglycemia and hypoglycemia.  Patient on Jardiance .  Will place back on sliding scale insulin .

## 2023-11-29 NOTE — Progress Notes (Signed)
 [facility]  ROUNDING NOTE   Subjective:  [name] is a 68 y.o. male with past medical conditions including CHF, COPD, CAD, stroke, diabetes, CKDIV and hypertension, who was admitted to [facility] on 11/04/2023 for COPD exacerbation (HCC) [J44.1]   Update: Renal function about the same today. Creatinine currently 3.95 with an EGFR 16. Good urine output noted. 06/17 0701 - 06/18 0700 In: 240 [P.O.:240] Out: 1800 [Urine:1800]    Objective:  Vital signs in last 24 hours:  Temp:  [97.5 F (36.4 C)-98 F (36.7 C)] 97.8 F (36.6 C) (06/18 1617) Pulse Rate:  [57-64] 64 (06/18 1617) Resp:  [18-22] 19 (06/18 1617) BP: (144-161)/(66-73) 161/73 (06/18 1617) SpO2:  [97 %-100 %] 97 % (06/18 1617) Weight:  [92.5 kg] (P) 92.5 kg (06/18 0404)  Weight change:  Filed Weights   11/27/23 0500 11/28/23 0500 11/29/23 0404  Weight: 90.9 kg 91.3 kg (P) 92.5 kg    Intake/Output: I/O last 3 completed shifts: In: 240 [P.O.:240] Out: 2550 [Urine:2550]   Intake/Output this shift:  Total I/O In: 480 [P.O.:480] Out: -   Physical Exam: General: NAD  Head: Normocephalic, atraumatic. Moist oral mucosal membranes  Eyes: Anicteric  Neck: Supple  Lungs:  Normal breathing effort on room air  Heart: Regular rate and rhythm  Abdomen:  Soft, nontender, BS present  Extremities: Trace peripheral edema.  Neurologic: Nonfocal, moving all four extremities  Skin: No lesions  Access: None    Basic Metabolic Panel: Recent Labs  Lab 11/24/23 0524 11/25/23 0320 11/26/23 0612 11/27/23 0310 11/28/23 0500  NA 133* 132* 131* 133* 137  K 4.9 5.0 4.8 4.9 4.9  CL 105 104 103 105 107  CO2 18* 18* 18* 17* 21*  GLUCOSE 317* 274* 311* 345* 205*  BUN 99* 103* 112* 118* 119*  CREATININE 4.73* 4.56* 4.39* 4.00* 3.95*  CALCIUM  8.0* 8.1* 8.0* 8.1* 8.5*  MG 2.2 2.0 2.1 2.2 2.3  PHOS 5.6* 5.0* 4.9* 4.7* 4.7*    Liver Function Tests: Recent Labs  Lab 11/24/23 0524 11/25/23 0320  11/26/23 0612 11/27/23 0310 11/28/23 0500  AST 14* 14* 15 18 14*  ALT 10 13 14 18 18   ALKPHOS 54 49 51 51 49  BILITOT 0.7 0.7 0.6 0.7 0.6  PROT 5.8* 5.8* 5.7* 6.0* 5.5*  ALBUMIN  2.2* 2.3* 2.3* 2.3* 2.3*   No results for input(s): LIPASE, AMYLASE in the last 168 hours. No results for input(s): AMMONIA in the last 168 hours.  CBC: Recent Labs  Lab 11/24/23 0524 11/25/23 0320 11/26/23 0612 11/27/23 0310 11/28/23 0500  WBC 6.3 4.0 4.1 4.3 7.9  NEUTROABS 5.5 3.2 2.9 3.7 6.3  HGB 8.1* 8.4* 8.8* 8.7* 9.0*  HCT 25.0* 26.2* 26.5* 27.0* 26.9*  MCV 89.3 88.5 87.2 87.1 86.8  PLT 182 190 190 198 192    Cardiac Enzymes: No results for input(s): CKTOTAL, CKMB, CKMBINDEX, TROPONINI in the last 168 hours.  BNP: Invalid input(s): POCBNP  CBG: Recent Labs  Lab 11/28/23 1659 11/28/23 2052 11/29/23 0806 11/29/23 1139 11/29/23 1618  GLUCAP 197* 256* 109* 117* 169*    Microbiology: Results for orders placed or performed during the hospital encounter of 11/22/23  Resp panel by RT-PCR (RSV, Flu A&B, Covid) Anterior Nasal Swab     Status: None   Collection Time: 11/22/23  8:26 PM   Specimen: Anterior Nasal Swab  Result Value Ref Range Status   SARS Coronavirus 2 by RT PCR NEGATIVE NEGATIVE Final    Comment: (NOTE)  SARS-CoV-2 target nucleic acids are NOT DETECTED.  The SARS-CoV-2 RNA is generally detectable in upper respiratory specimens during the acute phase of infection. The lowest concentration of SARS-CoV-2 viral copies this assay can detect is 138 copies/mL. A negative result does not preclude SARS-Cov-2 infection and should not be used as the sole basis for treatment or other patient management decisions. A negative result may occur with  improper specimen collection/handling, submission of specimen other than nasopharyngeal swab, presence of viral mutation(s) within the areas targeted by this assay, and inadequate number of viral copies(<138 copies/mL).  A negative result must be combined with clinical observations, patient history, and epidemiological information. The expected result is Negative.  Fact Sheet for Patients:  BloggerCourse.com  Fact Sheet for Healthcare Providers:  SeriousBroker.it  This test is no t yet approved or cleared by the [place]  FDA and  has been authorized for detection and/or diagnosis of SARS-CoV-2 by FDA under an Emergency Use Authorization (EUA). This EUA will remain  in effect (meaning this test can be used) for the duration of the COVID-19 declaration under Section 564(b)(1) of the Act, 21 U.S.C.section 360bbb-3(b)(1), unless the authorization is terminated  or revoked sooner.       Influenza A by PCR NEGATIVE NEGATIVE Final   Influenza B by PCR NEGATIVE NEGATIVE Final    Comment: (NOTE) The Xpert Xpress SARS-CoV-2/FLU/RSV plus assay is intended as an aid in the diagnosis of influenza from Nasopharyngeal swab specimens and should not be used as a sole basis for treatment. Nasal washings and aspirates are unacceptable for Xpert Xpress SARS-CoV-2/FLU/RSV testing.  Fact Sheet for Patients: BloggerCourse.com  Fact Sheet for Healthcare Providers: SeriousBroker.it  This test is not yet approved or cleared by the [place]  FDA and has been authorized for detection and/or diagnosis of SARS-CoV-2 by FDA under an Emergency Use Authorization (EUA). This EUA will remain in effect (meaning this test can be used) for the duration of the COVID-19 declaration under Section 564(b)(1) of the Act, 21 U.S.C. section 360bbb-3(b)(1), unless the authorization is terminated or revoked.     Resp Syncytial Virus by PCR NEGATIVE NEGATIVE Final    Comment: (NOTE) Fact Sheet for Patients: BloggerCourse.com  Fact Sheet for Healthcare  Providers: SeriousBroker.it  This test is not yet approved or cleared by the [place]  FDA and has been authorized for detection and/or diagnosis of SARS-CoV-2 by FDA under an Emergency Use Authorization (EUA). This EUA will remain in effect (meaning this test can be used) for the duration of the COVID-19 declaration under Section 564(b)(1) of the Act, 21 U.S.C. section 360bbb-3(b)(1), unless the authorization is terminated or revoked.  Performed at [facility], [address] [place], [place] [address]     Coagulation Studies: No results for input(s): LABPROT, INR in the last 72 hours.  Urinalysis: No results for input(s): COLORURINE, LABSPEC, PHURINE, GLUCOSEU, HGBUR, BILIRUBINUR, KETONESUR, PROTEINUR, UROBILINOGEN, NITRITE, LEUKOCYTESUR in the last 72 hours.  Invalid input(s): APPERANCEUR    Imaging: No results found.   Medications:     amLODipine   5 mg Oral BID   apixaban   5 mg Oral BID   budesonide -glycopyrrolate -formoterol   2 puff Inhalation BID   carvedilol   3.125 mg Oral BID WC   cyanocobalamin   1,000 mcg Oral Daily   ferrous sulfate   325 mg Oral QPM   insulin  aspart  0-20 Units Subcutaneous TID WC   insulin  aspart  0-5 Units Subcutaneous QHS   insulin  glargine-yfgn  25 Units Subcutaneous Daily  magic mouthwash w/lidocaine   5 mL Oral TID   pantoprazole   40 mg Oral Daily   rosuvastatin   10 mg Oral QHS   sodium bicarbonate   650 mg Oral TID   acetaminophen , albuterol , dextromethorphan -guaiFENesin , methocarbamol , ondansetron  (ZOFRAN ) IV, simethicone   Assessment/ Plan:  [name] is a 68 y.o.  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  Patient appears to have recurrent acute kidney injury.   Has been on and off diuretics.  Baseline creatinine appears to be 3.25 with GFR 20 on 10/17/23.  History of solitary kidney. 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 - Renal function about the same with creatinine of 3.95 and BUN of 119.  Reasonable urine output noted.  Patient has stated multiple times that he would not want renal placement therapy.   2.  Hypertension Maintain amlodipine  and carvedilol  for hypertension control..   3. Anemia of chronic kidney disease Patient received blood transfusion this admission.  11/11/2023, 11/15/2023. Hemoglobin 9.0 and acceptable for now. Lab Results  Component Value Date   HGB 9.0 (L) 11/28/2023     4. Diabetes mellitus type II with chronic kidney disease/renal manifestations: insulin  dependent. Home regimen includes Lantus  and humalog . Most recent hemoglobin A1c is 6.7 on 09/18/23.  Glucose management as per primary team.                 LOS: 0 Larry Callahan 6/18/20254:38 PM

## 2023-11-29 NOTE — Progress Notes (Signed)
 PT Cancellation Note  Patient Details Name: Larry Callahan MRN: [id number] DOB: 04/07/1956   Cancelled Treatment:    Reason Eval/Treat Not Completed: Patient declined to participate with PT services this date despite heavy encouragement and education on benefits of mobility.  Pt reported 3/10 abdominal pain, nursing notified. Will attempt to see pt at a future date/time as medically appropriate.     Lavenia Post PT, DPT 11/29/23, 2:35 PM

## 2023-11-29 NOTE — Assessment & Plan Note (Signed)
 Resolved.  Completed 5-day course of prednisone .  On room air.

## 2023-11-29 NOTE — Assessment & Plan Note (Addendum)
 Resolved

## 2023-11-29 NOTE — Progress Notes (Signed)
 The patient c/o gas pain 2/10. Last BM was 11/28/23. This writer [facility] and was given an order for Simethcone 80mg  PO 4 times a day PRN for flatulence. This Clinical research associate gave the patient PRN Simethicone  80mg  for c/o gas pain. Results are pending.

## 2023-11-29 NOTE — TOC Transition Note (Addendum)
 Transition of Care [facility]) - Discharge Note   Patient Details  Name: Larry Callahan MRN: [id number] Date of Birth: Nov 30, 1955  Transition of Care [facility]) CM/SW Contact:  Crayton Docker, RN 11/29/2023, 10:44 AM   Clinical Narrative:     Discharge orders noted, discharge summary noted, faxed to [facility]. CM call to [employer], phone: [phone]. CM spoke to [place]. BLS transport scheduled for 1200. CM to patient's room regarding pending discharge and BLS transport time. Patient amenable to discharge to [facility] today. CM informed patient, BLS transport will arrive at 1200.  Call received from Alvera Austria, Admissions Director, [facility] to reschedule BLS transport to 1500 to ensure room is ready for patient. CM call to [facility]. CM spoke to Perkins. BLS transport is rescheduled to 1500 today. CM alert to Dr. Clelia Current and RN Devra Fontana.    Alert from Delane Fear, CMA regarding SNF Auth Pending AuthID: [id number]  Call from [facility], Admissions Director, [facility] regarding auth status.  CM review in [employer] regarding auth ID [id number]. Auth still pending. CM will continue to follow for auth status.   Alert per Delane Fear, CMA, auth to peer to peer, by 12 Noon today. Please call [phone] opt.5 prior to 5pm on today 11/29/2023. Provide pts name, DOB and plan ID: [id number].   Alert received from Dr. Clelia Current, [employer] denial upheld. Per Dr. Clelia Current, please coordinate discharge for ALF or LTC. CM all to patient's nephew, Oral, phone: [phone] regarding patient returning home with patient's nephew. Per patient's nephew, patient cannot return to patient's nephew home and there are no other family members that can provide housing support. Per patient's nephew, patient use to stay with patient's sister before patient's sister passed away.   CM call placed to Dustin, [employer], phone: [phone]  regarding bed availability. No answer, CM left message for return call. CM call to [employer] regarding cancellation of BLS transport. CM will fax clinicals for expedited appeal to [phone].  Final next level of care: Skilled Nursing Facility Barriers to Discharge: No Barriers Identified   Patient Goals and CMS Choice    SNF  Discharge Placement    SNF           Discharge Plan and Services Additional resources added to the After Visit Summary for     Social Drivers of Health (SDOH) Interventions SDOH Screenings   Food Insecurity: No Food Insecurity (11/23/2023)  Housing: High Risk (11/23/2023)  Transportation Needs: No Transportation Needs (11/23/2023)  Utilities: Not At Risk (11/23/2023)  Alcohol Screen: Low Risk  (03/29/2022)  Depression (PHQ2-9): Low Risk  (09/13/2022)  Financial Resource Strain: Low Risk  (11/16/2023)  Social Connections: Unknown (11/23/2023)  Tobacco Use: High Risk (11/22/2023)     Readmission Risk Interventions    12/01/2021   12:25 PM 08/07/2021    2:36 PM 06/28/2021    2:26 PM  Readmission Risk Prevention Plan  Transportation Screening Complete Complete Complete  Medication Review Oceanographer) Complete Complete Complete  PCP or Specialist appointment within 3-5 days of discharge Complete Complete Complete  HRI or Home Care Consult Complete Complete Complete  SW Recovery Care/Counseling Consult Complete Complete Complete  Palliative Care Screening Complete Complete Not Applicable  Skilled Nursing Facility Complete Complete Not Applicable

## 2023-11-29 NOTE — Assessment & Plan Note (Addendum)
Last hemoglobin 8.8.

## 2023-11-29 NOTE — Assessment & Plan Note (Addendum)
 Rehab was rejected by insurance company.  TOC arranged a facility for him to go to.

## 2023-11-29 NOTE — Plan of Care (Signed)

## 2023-11-29 NOTE — Assessment & Plan Note (Deleted)
 Continue Eliquis .

## 2023-11-29 NOTE — Plan of Care (Signed)
 The patient is alert and oriented x 3. Speech is clear, able to voice needs to nursing staff. No s/s of acute distress noted.

## 2023-11-29 NOTE — Progress Notes (Signed)
 The patient had a small BM and continues to c/o gas pain 2/10. This Clinical research associate gave PRN Tylenol  650mg  for pain. The patient is resting quietly at present.

## 2023-11-29 NOTE — Assessment & Plan Note (Signed)
 Status post ablation.  Continue Eliquis  for anticoagulation.  On lower dose Coreg .

## 2023-11-29 NOTE — Assessment & Plan Note (Addendum)
 Patient euvolemic.  Patient on Avapro , Lasix  and weekly metolazone .  Continue Coreg .  Last EF shows a improved ejection fraction of 50%.

## 2023-11-29 NOTE — Hospital Course (Addendum)
 68 year old male with multiple medical problems including: heart failure with reduced EF 40% secondary to nonischemic cardiomyopathy, a flutter status post ablation on Eliquis , CKD stage IV, hypertension, type 2 diabetes, COPD, OSA, HLD, nonobstructive CAD, CVA.  Patient was recently admitted to the hospital and discharged on 6/2.  During that admission he was seen for COPD exacerbation and AKI superimposed on CKD.  He was discharged home in the custody of his nephew with whom he resides.  He then presented to the ED again on 6/4 requesting SNF placement.  He remained in the ED for 4 days pending SNF arrangements.  Ultimately SNF was found to be cost prohibitive and the patient discharged back home to his nephew on 6/8.  He presents again to the ED on this admission requesting long-term placement.  He reports he is no longer safe at home with his nephew but can also not afford skilled nursing facility placement.  ED physician had concerns of tachypnea with expiratory wheeze and requested admission for COPD exacerbation.  Patient's respiratory status appeared to be at baseline and he is medically cleared for discharge.  Stay was then prolonged pending placement.  Insurance company rejected rehab on 6/18 so discharge was canceled.  Looking into other options.  6/23.  Called to see patient secondary to left facial swelling.  Patient with poor dentition.  Will start Solu-Medrol , Rocephin  and Flagyl .  No abscess identified on CT.  He is medically clear for dc. Discharge is pending placement. TOC is following.

## 2023-11-29 NOTE — Assessment & Plan Note (Addendum)
 AKI on CKD stage IV.  Baseline creatinine around 3.5.  Patient creatinine was up at 4.7.  Holding diuretic.  Last creatinine 2.87 with a GFR of 23.  On oral sodium bicarb.

## 2023-11-29 NOTE — Progress Notes (Signed)
 Rehab rejected by [employer].  Will cancel discharge.  Dr Clelia Current

## 2023-11-30 DIAGNOSIS — Z59819 Housing instability, housed unspecified: Secondary | ICD-10-CM | POA: Diagnosis not present

## 2023-11-30 DIAGNOSIS — K148 Other diseases of tongue: Secondary | ICD-10-CM | POA: Diagnosis not present

## 2023-11-30 DIAGNOSIS — J441 Chronic obstructive pulmonary disease with (acute) exacerbation: Secondary | ICD-10-CM | POA: Diagnosis not present

## 2023-11-30 DIAGNOSIS — N179 Acute kidney failure, unspecified: Secondary | ICD-10-CM | POA: Diagnosis not present

## 2023-11-30 LAB — GLUCOSE, CAPILLARY
Glucose-Capillary: 95 mg/dL (ref 70–99)
Glucose-Capillary: 220 mg/dL — ABNORMAL HIGH (ref 70–99)
Glucose-Capillary: 233 mg/dL — ABNORMAL HIGH (ref 70–99)
Glucose-Capillary: 128 mg/dL — ABNORMAL HIGH (ref 70–99)
Glucose-Capillary: 135 mg/dL — ABNORMAL HIGH (ref 70–99)

## 2023-11-30 NOTE — Assessment & Plan Note (Signed)
 Continue Crestor

## 2023-11-30 NOTE — Assessment & Plan Note (Signed)
 Continue Eliquis .

## 2023-11-30 NOTE — Progress Notes (Signed)
 Progress Note   Patient: Larry Callahan ZOX:[id number] DOB: 1955-12-13 DOA: 11/22/2023     0 DOS: the patient was seen and examined on 11/30/2023   Brief hospital course:  68 year old male with multiple medical problems including: heart failure with reduced EF 40% secondary to nonischemic cardiomyopathy, a flutter status post ablation on Eliquis , CKD stage IV, hypertension, type 2 diabetes, COPD, OSA, HLD, nonobstructive CAD, CVA.  Patient was recently admitted to the hospital and discharged on 6/2.  During that admission he was seen for COPD exacerbation and AKI superimposed on CKD.  He was discharged home in the custody of his nephew with whom he resides.  He then presented to the ED again on 6/4 requesting SNF placement.  He remained in the ED for 4 days pending SNF arrangements.  Ultimately SNF was found to be cost prohibitive and the patient discharged back home to his nephew on 6/8.  He presents again to the ED on this admission requesting long-term placement.  He reports he is no longer safe at home with his nephew but can also not afford skilled nursing facility placement.  ED physician had concerns of tachypnea with expiratory wheeze and requested admission for COPD exacerbation.  Patient's respiratory status appeared to be at baseline and he is medically cleared for discharge.  Stay was then prolonged pending placement.  Insurance company rejected rehab on 6/18 so discharge was canceled.  Looking into other options.    Assessment and Plan: * Housing situation unstable Rehab was rejected by [employer].  TOC looking into other options.  Tongue lesion Likely aphthous ulcer.  Magic mouthwash.  Chronic systolic CHF (congestive heart failure) (HCC) Patient euvolemic.  Holding off on diuretics secondary to kidney function.  Patient not short of breath and breathing comfortably on room air.  Continue Coreg .  Last EF shows a improved ejection fraction of 50%.  Acute kidney injury  superimposed on CKD (HCC) Baseline creatinine around 3.5.  Patient creatinine was up at 4.7.  Holding diuretic.  Last creatinine 3.95.  Will check BMP tomorrow  Type II diabetes mellitus with renal manifestations (HCC) And hyperglycemia.  Continue Semglee  insulin  with sliding scale.  Atrial flutter (HCC) Status post ablation.  Continue Eliquis  for anticoagulation.  On lower dose Coreg .  Essential hypertension Continue Coreg   COPD exacerbation (HCC) Resolved.  Completed 5-day course of prednisone .  On room air.  HLD (hyperlipidemia) Continue Crestor   Iron  deficiency anemia Last hemoglobin 9  Hx of completed stroke Continue Eliquis         Subjective: Patient feeling okay.  Offers no complaints.  Admitted 7 days ago With wheezing cough and shortness of breath.  Physical Exam: Vitals:   11/29/23 2012 11/30/23 0500 11/30/23 0541 11/30/23 0903  BP: 137/62  (!) 151/66 (!) 152/75  Pulse: 73  72 73  Resp: 18  18 16   Temp: 98.2 F (36.8 C)  (!) 97.2 F (36.2 C) 98.2 F (36.8 C)  TempSrc:      SpO2: 95%  95% 97%  Weight:  90.3 kg    Height:       Physical Exam HENT:     Head: Normocephalic.   Eyes:     General: Lids are normal.     Conjunctiva/sclera: Conjunctivae normal.    Cardiovascular:     Rate and Rhythm: Normal rate and regular rhythm.     Heart sounds: Normal heart sounds, S1 normal and S2 normal.  Pulmonary:     Breath sounds: No decreased breath  sounds, wheezing, rhonchi or rales.  Abdominal:     Palpations: Abdomen is soft.     Tenderness: There is no abdominal tenderness.   Musculoskeletal:     Right lower leg: No swelling.     Left lower leg: No swelling.   Skin:    General: Skin is warm.     Findings: No rash.   Neurological:     Mental Status: He is alert.     Data Reviewed: No new data  Disposition: Status is: [employer] company denied rehab on 6/18.  TOC looking into other options.  Planned Discharge Destination: We do  not have a place for patient to go yet.    Time spent: 28 minutes  Author: Verla Glaze, MD 11/30/2023 1:17 PM  For on call review [email or web address].

## 2023-11-30 NOTE — Progress Notes (Signed)
 [facility]  ROUNDING NOTE   Subjective:  [name] is a 68 y.o. male with past medical conditions including CHF, COPD, CAD, stroke, diabetes, CKDIV and hypertension, who was admitted to [facility] on 11/04/2023 for COPD exacerbation (HCC) [J44.1]   Update: No new renal function testing available today. Continues to have reasonable urine output 1.3 L over the preceding 24 hours. Outpatient rehabilitation center placement still pending. 06/18 0701 - 06/19 0700 In: 480 [P.O.:480] Out: 1325 [Urine:1325]    Objective:  Vital signs in last 24 hours:  Temp:  [97.2 F (36.2 C)-98.2 F (36.8 C)] 98.1 F (36.7 C) (06/19 1620) Pulse Rate:  [72-75] 75 (06/19 1620) Resp:  [16-18] 16 (06/19 1620) BP: (137-158)/(62-78) 158/78 (06/19 1620) SpO2:  [95 %-100 %] 100 % (06/19 1620) Weight:  [90.3 kg] 90.3 kg (06/19 0500)  Weight change:  Filed Weights   11/28/23 0500 11/29/23 0404 11/30/23 0500  Weight: 91.3 kg (P) 92.5 kg 90.3 kg    Intake/Output: I/O last 3 completed shifts: In: 480 [P.O.:480] Out: 2125 [Urine:2125]   Intake/Output this shift:  Total I/O In: 480 [P.O.:480] Out: -   Physical Exam: General: NAD  Head: Normocephalic, atraumatic. Moist oral mucosal membranes  Eyes: Anicteric  Neck: Supple  Lungs:  Normal breathing effort on room air  Heart: Regular rate and rhythm  Abdomen:  Soft, nontender, BS present  Extremities: Trace peripheral edema.  Neurologic: Nonfocal, moving all four extremities  Skin: No lesions  Access: None    Basic Metabolic Panel: Recent Labs  Lab 11/24/23 0524 11/25/23 0320 11/26/23 0612 11/27/23 0310 11/28/23 0500  NA 133* 132* 131* 133* 137  K 4.9 5.0 4.8 4.9 4.9  CL 105 104 103 105 107  CO2 18* 18* 18* 17* 21*  GLUCOSE 317* 274* 311* 345* 205*  BUN 99* 103* 112* 118* 119*  CREATININE 4.73* 4.56* 4.39* 4.00* 3.95*  CALCIUM  8.0* 8.1* 8.0* 8.1* 8.5*  MG 2.2 2.0 2.1 2.2 2.3  PHOS 5.6* 5.0* 4.9* 4.7* 4.7*     Liver Function Tests: Recent Labs  Lab 11/24/23 0524 11/25/23 0320 11/26/23 0612 11/27/23 0310 11/28/23 0500  AST 14* 14* 15 18 14*  ALT 10 13 14 18 18   ALKPHOS 54 49 51 51 49  BILITOT 0.7 0.7 0.6 0.7 0.6  PROT 5.8* 5.8* 5.7* 6.0* 5.5*  ALBUMIN  2.2* 2.3* 2.3* 2.3* 2.3*   No results for input(s): LIPASE, AMYLASE in the last 168 hours. No results for input(s): AMMONIA in the last 168 hours.  CBC: Recent Labs  Lab 11/24/23 0524 11/25/23 0320 11/26/23 0612 11/27/23 0310 11/28/23 0500  WBC 6.3 4.0 4.1 4.3 7.9  NEUTROABS 5.5 3.2 2.9 3.7 6.3  HGB 8.1* 8.4* 8.8* 8.7* 9.0*  HCT 25.0* 26.2* 26.5* 27.0* 26.9*  MCV 89.3 88.5 87.2 87.1 86.8  PLT 182 190 190 198 192    Cardiac Enzymes: No results for input(s): CKTOTAL, CKMB, CKMBINDEX, TROPONINI in the last 168 hours.  BNP: Invalid input(s): POCBNP  CBG: Recent Labs  Lab 11/29/23 2157 11/30/23 0904 11/30/23 1150 11/30/23 1343 11/30/23 1621  GLUCAP 175* 95 220* 233* 128*    Microbiology: Results for orders placed or performed during the hospital encounter of 11/22/23  Resp panel by RT-PCR (RSV, Flu A&B, Covid) Anterior Nasal Swab     Status: None   Collection Time: 11/22/23  8:26 PM   Specimen: Anterior Nasal Swab  Result Value Ref Range Status   SARS Coronavirus 2 by RT PCR  NEGATIVE NEGATIVE Final    Comment: (NOTE) SARS-CoV-2 target nucleic acids are NOT DETECTED.  The SARS-CoV-2 RNA is generally detectable in upper respiratory specimens during the acute phase of infection. The lowest concentration of SARS-CoV-2 viral copies this assay can detect is 138 copies/mL. A negative result does not preclude SARS-Cov-2 infection and should not be used as the sole basis for treatment or other patient management decisions. A negative result may occur with  improper specimen collection/handling, submission of specimen other than nasopharyngeal swab, presence of viral mutation(s) within the areas  targeted by this assay, and inadequate number of viral copies(<138 copies/mL). A negative result must be combined with clinical observations, patient history, and epidemiological information. The expected result is Negative.  Fact Sheet for Patients:  BloggerCourse.com  Fact Sheet for Healthcare Providers:  SeriousBroker.it  This test is no t yet approved or cleared by the [place]  FDA and  has been authorized for detection and/or diagnosis of SARS-CoV-2 by FDA under an Emergency Use Authorization (EUA). This EUA will remain  in effect (meaning this test can be used) for the duration of the COVID-19 declaration under Section 564(b)(1) of the Act, 21 U.S.C.section 360bbb-3(b)(1), unless the authorization is terminated  or revoked sooner.       Influenza A by PCR NEGATIVE NEGATIVE Final   Influenza B by PCR NEGATIVE NEGATIVE Final    Comment: (NOTE) The Xpert Xpress SARS-CoV-2/FLU/RSV plus assay is intended as an aid in the diagnosis of influenza from Nasopharyngeal swab specimens and should not be used as a sole basis for treatment. Nasal washings and aspirates are unacceptable for Xpert Xpress SARS-CoV-2/FLU/RSV testing.  Fact Sheet for Patients: BloggerCourse.com  Fact Sheet for Healthcare Providers: SeriousBroker.it  This test is not yet approved or cleared by the [place]  FDA and has been authorized for detection and/or diagnosis of SARS-CoV-2 by FDA under an Emergency Use Authorization (EUA). This EUA will remain in effect (meaning this test can be used) for the duration of the COVID-19 declaration under Section 564(b)(1) of the Act, 21 U.S.C. section 360bbb-3(b)(1), unless the authorization is terminated or revoked.     Resp Syncytial Virus by PCR NEGATIVE NEGATIVE Final    Comment: (NOTE) Fact Sheet for  Patients: BloggerCourse.com  Fact Sheet for Healthcare Providers: SeriousBroker.it  This test is not yet approved or cleared by the [place]  FDA and has been authorized for detection and/or diagnosis of SARS-CoV-2 by FDA under an Emergency Use Authorization (EUA). This EUA will remain in effect (meaning this test can be used) for the duration of the COVID-19 declaration under Section 564(b)(1) of the Act, 21 U.S.C. section 360bbb-3(b)(1), unless the authorization is terminated or revoked.  Performed at [facility], [address] [place], [place] [address]     Coagulation Studies: No results for input(s): LABPROT, INR in the last 72 hours.  Urinalysis: No results for input(s): COLORURINE, LABSPEC, PHURINE, GLUCOSEU, HGBUR, BILIRUBINUR, KETONESUR, PROTEINUR, UROBILINOGEN, NITRITE, LEUKOCYTESUR in the last 72 hours.  Invalid input(s): APPERANCEUR    Imaging: No results found.   Medications:     amLODipine   5 mg Oral BID   apixaban   5 mg Oral BID   budesonide -glycopyrrolate -formoterol   2 puff Inhalation BID   carvedilol   3.125 mg Oral BID WC   cyanocobalamin   1,000 mcg Oral Daily   ferrous sulfate   325 mg Oral QPM   insulin  aspart  0-20 Units Subcutaneous TID WC   insulin  aspart  0-5 Units Subcutaneous QHS   insulin   glargine-yfgn  25 Units Subcutaneous Daily   magic mouthwash w/lidocaine   5 mL Oral TID   pantoprazole   40 mg Oral Daily   rosuvastatin   10 mg Oral QHS   sodium bicarbonate   650 mg Oral TID   acetaminophen , albuterol , dextromethorphan -guaiFENesin , methocarbamol , ondansetron  (ZOFRAN ) IV, simethicone   Assessment/ Plan:  [name] is a 68 y.o.  male with past medical conditions including CHF, COPD, CAD, stroke, diabetes, CKDIV and hypertension, who was admitted to [facility] on 11/04/2023 for COPD exacerbation (HCC) [J44.1]     Acute kidney injury on  chronic kidney disease IV.  Patient appears to have recurrent acute kidney injury.  Has been on and off diuretics.  Baseline creatinine appears to be 3.25 with GFR 20 on 10/17/23.  History of solitary kidney. 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 - No new renal function testing available today.  Continue periodically monitor renal parameters periodically.  Good urine output 1.3 L over the preceding 24 hours.  Patient has decided against renal placement therapy should he develop ESRD.  2.  Hypertension Maintain amlodipine  and carvedilol  for hypertension control.  Blood pressure 158/78 today.   3. Anemia of chronic kidney disease Patient received blood transfusion this admission.  11/11/2023, 11/15/2023. Hemoglobin 9.0 and acceptable for now.  Consider Epogen as outpatient. Lab Results  Component Value Date   HGB 9.0 (L) 11/28/2023     4. Diabetes mellitus type II with chronic kidney disease/renal manifestations: insulin  dependent. Home regimen includes Lantus  and humalog . Most recent hemoglobin A1c is 6.7 on 09/18/23.  Glucose management as per primary team.                 LOS: 0 Corney Knighton 6/19/20255:14 PM

## 2023-12-01 DIAGNOSIS — E1122 Type 2 diabetes mellitus with diabetic chronic kidney disease: Secondary | ICD-10-CM

## 2023-12-01 DIAGNOSIS — N179 Acute kidney failure, unspecified: Secondary | ICD-10-CM | POA: Diagnosis not present

## 2023-12-01 DIAGNOSIS — I5022 Chronic systolic (congestive) heart failure: Secondary | ICD-10-CM | POA: Diagnosis not present

## 2023-12-01 DIAGNOSIS — I10 Essential (primary) hypertension: Secondary | ICD-10-CM | POA: Diagnosis not present

## 2023-12-01 DIAGNOSIS — Z59819 Housing instability, housed unspecified: Secondary | ICD-10-CM | POA: Diagnosis not present

## 2023-12-01 DIAGNOSIS — N1832 Chronic kidney disease, stage 3b: Secondary | ICD-10-CM

## 2023-12-01 DIAGNOSIS — K148 Other diseases of tongue: Secondary | ICD-10-CM | POA: Diagnosis not present

## 2023-12-01 LAB — BASIC METABOLIC PANEL WITH GFR
Anion gap: 5 (ref 5–15)
BUN: 103 mg/dL — ABNORMAL HIGH (ref 8–23)
CO2: 22 mmol/L (ref 22–32)
Calcium: 7.7 mg/dL — ABNORMAL LOW (ref 8.9–10.3)
Chloride: 111 mmol/L (ref 98–111)
Creatinine, Ser: 3.18 mg/dL — ABNORMAL HIGH (ref 0.61–1.24)
GFR, Estimated: 20 mL/min — ABNORMAL LOW (ref >60)
Glucose, Bld: 118 mg/dL — ABNORMAL HIGH (ref 70–99)
Potassium: 4.8 mmol/L (ref 3.5–5.1)
Sodium: 138 mmol/L (ref 135–145)

## 2023-12-01 LAB — CBC
HCT: 25.8 % — ABNORMAL LOW (ref 39.0–52.0)
Hemoglobin: 8.4 g/dL — ABNORMAL LOW (ref 13.0–17.0)
MCH: 29 pg (ref 26.0–34.0)
MCHC: 32.6 g/dL (ref 30.0–36.0)
MCV: 89 fL (ref 80.0–100.0)
Platelets: 150 10*3/uL (ref 150–400)
RBC: 2.9 MIL/uL — ABNORMAL LOW (ref 4.22–5.81)
RDW: 13.8 % (ref 11.5–15.5)
WBC: 6.6 10*3/uL (ref 4.0–10.5)
nRBC: 0 % (ref 0.0–0.2)

## 2023-12-01 LAB — GLUCOSE, CAPILLARY
Glucose-Capillary: 99 mg/dL (ref 70–99)
Glucose-Capillary: 159 mg/dL — ABNORMAL HIGH (ref 70–99)
Glucose-Capillary: 150 mg/dL — ABNORMAL HIGH (ref 70–99)
Glucose-Capillary: 313 mg/dL — ABNORMAL HIGH (ref 70–99)
Glucose-Capillary: 55 mg/dL — ABNORMAL LOW (ref 70–99)
Glucose-Capillary: 85 mg/dL (ref 70–99)

## 2023-12-01 NOTE — Plan of Care (Signed)

## 2023-12-01 NOTE — Progress Notes (Signed)
 [facility]  ROUNDING NOTE   Subjective:  [name] is a 68 y.o. male with past medical conditions including CHF, COPD, CAD, stroke, diabetes, CKDIV and hypertension, who was admitted to [facility] on 11/04/2023 for COPD exacerbation (HCC) [J44.1]   Update: Renal function improving.  Urine output 1 L over the preceding 24 hours. In good spirits today. 06/19 0701 - 06/20 0700 In: 600 [P.O.:600] Out: 1050 [Urine:1050]    Objective:  Vital signs in last 24 hours:  Temp:  [97.4 F (36.3 C)-98.2 F (36.8 C)] 98.2 F (36.8 C) (06/20 1536) Pulse Rate:  [68-80] 70 (06/20 1536) Resp:  [15-17] 16 (06/20 1536) BP: (137-164)/(55-85) 164/85 (06/20 1536) SpO2:  [98 %-100 %] 100 % (06/20 1536) Weight:  [90 kg] 90 kg (06/20 0349)  Weight change: -0.3 kg Filed Weights   11/29/23 0404 11/30/23 0500 12/01/23 0349  Weight: (P) 92.5 kg 90.3 kg 90 kg    Intake/Output: I/O last 3 completed shifts: In: 600 [P.O.:600] Out: 2375 [Urine:2375]   Intake/Output this shift:  Total I/O In: 240 [P.O.:240] Out: 450 [Urine:450]  Physical Exam: General: NAD  Head: Normocephalic, atraumatic. Moist oral mucosal membranes  Eyes: Anicteric  Neck: Supple  Lungs:  Normal breathing effort on room air  Heart: Regular rate and rhythm  Abdomen:  Soft, nontender, BS present  Extremities: Trace peripheral edema.  Neurologic: Nonfocal, moving all four extremities  Skin: No lesions  Access: None    Basic Metabolic Panel: Recent Labs  Lab 11/25/23 0320 11/26/23 0612 11/27/23 0310 11/28/23 0500 12/01/23 0618  NA 132* 131* 133* 137 138  K 5.0 4.8 4.9 4.9 4.8  CL 104 103 105 107 111  CO2 18* 18* 17* 21* 22  GLUCOSE 274* 311* 345* 205* 118*  BUN 103* 112* 118* 119* 103*  CREATININE 4.56* 4.39* 4.00* 3.95* 3.18*  CALCIUM  8.1* 8.0* 8.1* 8.5* 7.7*  MG 2.0 2.1 2.2 2.3  --   PHOS 5.0* 4.9* 4.7* 4.7*  --     Liver Function Tests: Recent Labs  Lab 11/25/23 0320  11/26/23 0612 11/27/23 0310 11/28/23 0500  AST 14* 15 18 14*  ALT 13 14 18 18   ALKPHOS 49 51 51 49  BILITOT 0.7 0.6 0.7 0.6  PROT 5.8* 5.7* 6.0* 5.5*  ALBUMIN  2.3* 2.3* 2.3* 2.3*   No results for input(s): LIPASE, AMYLASE in the last 168 hours. No results for input(s): AMMONIA in the last 168 hours.  CBC: Recent Labs  Lab 11/25/23 0320 11/26/23 0612 11/27/23 0310 11/28/23 0500 12/01/23 0618  WBC 4.0 4.1 4.3 7.9 6.6  NEUTROABS 3.2 2.9 3.7 6.3  --   HGB 8.4* 8.8* 8.7* 9.0* 8.4*  HCT 26.2* 26.5* 27.0* 26.9* 25.8*  MCV 88.5 87.2 87.1 86.8 89.0  PLT 190 190 198 192 150    Cardiac Enzymes: No results for input(s): CKTOTAL, CKMB, CKMBINDEX, TROPONINI in the last 168 hours.  BNP: Invalid input(s): POCBNP  CBG: Recent Labs  Lab 11/30/23 1621 11/30/23 2043 12/01/23 0732 12/01/23 1151 12/01/23 1539  GLUCAP 128* 135* 99 159* 150*    Microbiology: Results for orders placed or performed during the hospital encounter of 11/22/23  Resp panel by RT-PCR (RSV, Flu A&B, Covid) Anterior Nasal Swab     Status: None   Collection Time: 11/22/23  8:26 PM   Specimen: Anterior Nasal Swab  Result Value Ref Range Status   SARS Coronavirus 2 by RT PCR NEGATIVE NEGATIVE Final    Comment: (NOTE) SARS-CoV-2 target  nucleic acids are NOT DETECTED.  The SARS-CoV-2 RNA is generally detectable in upper respiratory specimens during the acute phase of infection. The lowest concentration of SARS-CoV-2 viral copies this assay can detect is 138 copies/mL. A negative result does not preclude SARS-Cov-2 infection and should not be used as the sole basis for treatment or other patient management decisions. A negative result may occur with  improper specimen collection/handling, submission of specimen other than nasopharyngeal swab, presence of viral mutation(s) within the areas targeted by this assay, and inadequate number of viral copies(<138 copies/mL). A negative result must be  combined with clinical observations, patient history, and epidemiological information. The expected result is Negative.  Fact Sheet for Patients:  BloggerCourse.com  Fact Sheet for Healthcare Providers:  SeriousBroker.it  This test is no t yet approved or cleared by the [place]  FDA and  has been authorized for detection and/or diagnosis of SARS-CoV-2 by FDA under an Emergency Use Authorization (EUA). This EUA will remain  in effect (meaning this test can be used) for the duration of the COVID-19 declaration under Section 564(b)(1) of the Act, 21 U.S.C.section 360bbb-3(b)(1), unless the authorization is terminated  or revoked sooner.       Influenza A by PCR NEGATIVE NEGATIVE Final   Influenza B by PCR NEGATIVE NEGATIVE Final    Comment: (NOTE) The Xpert Xpress SARS-CoV-2/FLU/RSV plus assay is intended as an aid in the diagnosis of influenza from Nasopharyngeal swab specimens and should not be used as a sole basis for treatment. Nasal washings and aspirates are unacceptable for Xpert Xpress SARS-CoV-2/FLU/RSV testing.  Fact Sheet for Patients: BloggerCourse.com  Fact Sheet for Healthcare Providers: SeriousBroker.it  This test is not yet approved or cleared by the [place]  FDA and has been authorized for detection and/or diagnosis of SARS-CoV-2 by FDA under an Emergency Use Authorization (EUA). This EUA will remain in effect (meaning this test can be used) for the duration of the COVID-19 declaration under Section 564(b)(1) of the Act, 21 U.S.C. section 360bbb-3(b)(1), unless the authorization is terminated or revoked.     Resp Syncytial Virus by PCR NEGATIVE NEGATIVE Final    Comment: (NOTE) Fact Sheet for Patients: BloggerCourse.com  Fact Sheet for Healthcare Providers: SeriousBroker.it  This test is not yet  approved or cleared by the [place]  FDA and has been authorized for detection and/or diagnosis of SARS-CoV-2 by FDA under an Emergency Use Authorization (EUA). This EUA will remain in effect (meaning this test can be used) for the duration of the COVID-19 declaration under Section 564(b)(1) of the Act, 21 U.S.C. section 360bbb-3(b)(1), unless the authorization is terminated or revoked.  Performed at [facility], [address] [place], [place] [address]     Coagulation Studies: No results for input(s): LABPROT, INR in the last 72 hours.  Urinalysis: No results for input(s): COLORURINE, LABSPEC, PHURINE, GLUCOSEU, HGBUR, BILIRUBINUR, KETONESUR, PROTEINUR, UROBILINOGEN, NITRITE, LEUKOCYTESUR in the last 72 hours.  Invalid input(s): APPERANCEUR    Imaging: No results found.   Medications:     amLODipine   5 mg Oral BID   apixaban   5 mg Oral BID   budesonide -glycopyrrolate -formoterol   2 puff Inhalation BID   carvedilol   3.125 mg Oral BID WC   cyanocobalamin   1,000 mcg Oral Daily   ferrous sulfate   325 mg Oral QPM   insulin  aspart  0-20 Units Subcutaneous TID WC   insulin  aspart  0-5 Units Subcutaneous QHS   insulin  glargine-yfgn  25 Units Subcutaneous Daily   magic mouthwash  w/lidocaine   5 mL Oral TID   pantoprazole   40 mg Oral Daily   rosuvastatin   10 mg Oral QHS   sodium bicarbonate   650 mg Oral TID   acetaminophen , albuterol , dextromethorphan -guaiFENesin , methocarbamol , ondansetron  (ZOFRAN ) IV, simethicone   Assessment/ Plan:  [name] is a 68 y.o.  male with past medical conditions including CHF, COPD, CAD, stroke, diabetes, CKDIV and hypertension, who was admitted to [facility] on 11/04/2023 for COPD exacerbation (HCC) [J44.1]     Acute kidney injury on chronic kidney disease IV.  Patient appears to have recurrent acute kidney injury.  Has been on and off diuretics.  Baseline creatinine appears to be 3.25 with GFR  20 on 10/17/23.  History of solitary kidney. 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 - Patient seen and evaluated at bedside.  Creatinine improving and down to 3.2.  Good urine output noted at 1 L.  No indication for dialysis.  2.  Hypertension Maintain amlodipine  and carvedilol  for hypertension control.  Blood pressure currently 164/85.   3. Anemia of chronic kidney disease Patient received blood transfusion this admission.  11/11/2023, 11/15/2023. Hemoglobin 8.4.  Consider Epogen as outpatient. Lab Results  Component Value Date   HGB 8.4 (L) 12/01/2023     4. Diabetes mellitus type II with chronic kidney disease/renal manifestations: insulin  dependent. Home regimen includes Lantus  and humalog . Most recent hemoglobin A1c is 6.7 on 09/18/23.  Glucose management as per primary team.                 LOS: 0 Keyerra Lamere 6/20/20253:46 PM

## 2023-12-01 NOTE — Progress Notes (Signed)
 Physical Therapy Treatment Patient Details Name: Larry Callahan MRN: [id number] DOB: [birth year] Today's Date: 12/01/2023   History of Present Illness Larry Callahan is a 68 year old male with history of CHF, a flutter on Eliquis , CKD, T2DM, and CAD who presenting to the ER for housing concerns. MD diagnoses includes COPD, CHF, HTN, atrial flutter, type II diabetes, HLD, and CAD.    PT Comments  Pt received in Semi-Fowler's position and begrudgingly agreed to therapy once he finished his lunch.  Pt able to perform bed mobility without any physical assistance necessary and utilized rollator for transfers.  Pt noted he needed to utilize the restroom and have a BM.  Pt able to perform transfers with supervision and perform pericare as well with supervision.  Pt unaware of having bowel on his hands and had to be reminded by therapist to wash hands following session.  Pt then elected to ambulate around the nursing station and performed well, even with wet floor from mopping.  Pt returned to bed and was left with all needs met and call bell within reach.     If plan is discharge home, recommend the following: A little help with walking and/or transfers;A little help with bathing/dressing/bathroom;Assistance with cooking/housework;Assist for transportation;Help with stairs or ramp for entrance   Can travel by private vehicle     Yes  Equipment Recommendations  None recommended by PT    Recommendations for Other Services       Precautions / Restrictions Precautions Precautions: Fall Recall of Precautions/Restrictions: Impaired Restrictions Weight Bearing Restrictions Per Provider Order: No     Mobility  Bed Mobility Overal bed mobility: Independent Bed Mobility: Rolling, Sidelying to Sit, Supine to Sit Rolling: Independent Sidelying to sit: Independent Supine to sit: Independent Sit to supine: Independent   General bed mobility comments: Pt was able to perform bed  mobility with no physical assistance or bedrails    Transfers Overall transfer level: Needs assistance Equipment used: Rolling walker (2 wheels) Transfers: Sit to/from Stand Sit to Stand: Contact guard assist           General transfer comment: Pt was able to complete STS from EOB with RW with CGA. Pt remained steady and no LOB noted    Ambulation/Gait Ambulation/Gait assistance: Contact guard assist Gait Distance (Feet): 160 Feet Assistive device: Rolling walker (2 wheels) Gait Pattern/deviations: Step-through pattern, Trunk flexed, Decreased step length - right, Decreased step length - left Gait velocity: decreased     General Gait Details: Pt performed well within the hallway, even with the hallway being wet from mopping.  Pt much more alert and focused on ambulation likely due to the wet floor.  SOB halfway through first lap.   Stairs             Wheelchair Mobility     Tilt Bed    Modified Rankin (Stroke Patients Only)       Balance Overall balance assessment: History of Falls, Needs assistance Sitting-balance support: Feet supported Sitting balance-Leahy Scale: Normal Sitting balance - Comments: Pt able to maintain balance independently at EOB with no physical assistance or holding onto bedrails Postural control: Posterior lean Standing balance support: Reliant on assistive device for balance, Bilateral upper extremity supported, During functional activity Standing balance-Leahy Scale: Good Standing balance comment: Pt maintained balance in standing with BUE support from RW  Communication Communication Communication: Impaired Factors Affecting Communication: Hearing impaired  Cognition Arousal: Alert Behavior During Therapy: WFL for tasks assessed/performed                             Following commands: Impaired Following commands impaired: Follows one step commands inconsistently, Follows  multi-step commands inconsistently    Cueing Cueing Techniques: Verbal cues, Tactile cues  Exercises      General Comments General comments (skin integrity, edema, etc.): Much improved ambulation attempt, however still SOB, maintaining HR in 90's and SpO2 >94% with ambulation.      Pertinent Vitals/Pain Pain Assessment Pain Assessment: No/denies pain Pain Intervention(s): Limited activity within patient's tolerance, Repositioned    Home Living                          Prior Function            PT Goals (current goals can now be found in the care plan section) Acute Rehab PT Goals Patient Stated Goal: to be able to walk at baseline level of function PT Goal Formulation: With patient Time For Goal Achievement: 12/07/23 Potential to Achieve Goals: Good Progress towards PT goals: Progressing toward goals    Frequency    Min 2X/week      PT Plan      Co-evaluation              AM-PAC PT 6 Clicks Mobility   Outcome Measure  Help needed turning from your back to your side while in a flat bed without using bedrails?: None Help needed moving from lying on your back to sitting on the side of a flat bed without using bedrails?: None Help needed moving to and from a bed to a chair (including a wheelchair)?: None Help needed standing up from a chair using your arms (e.g., wheelchair or bedside chair)?: A Little Help needed to walk in hospital room?: A Little Help needed climbing 3-5 steps with a railing? : A Little 6 Click Score: 21    End of Session Equipment Utilized During Treatment: Gait belt Activity Tolerance: Patient tolerated treatment well Patient left: in bed;with call bell/phone within reach;with bed alarm set Nurse Communication: Mobility status PT Visit Diagnosis: Repeated falls (R29.6);History of falling (Z91.81);Unsteadiness on feet (R26.81);Muscle weakness (generalized) (M62.81)     Time: 8119-1478 PT Time Calculation (min) (ACUTE  ONLY): 26 min  Charges:    $Therapeutic Exercise: 23-37 mins PT General Charges $$ ACUTE PT VISIT: 1 Visit                     Rozanna Corner, PT, DPT Physical Therapist - [facility]  12/01/23, 4:11 PM

## 2023-12-01 NOTE — Progress Notes (Signed)
 Progress Note   Patient: Larry Callahan ZOX:[id number] DOB: 09-Dec-1955 DOA: 11/22/2023     0 DOS: the patient was seen and examined on 12/01/2023   Brief hospital course:  68 year old male with multiple medical problems including: heart failure with reduced EF 40% secondary to nonischemic cardiomyopathy, a flutter status post ablation on Eliquis , CKD stage IV, hypertension, type 2 diabetes, COPD, OSA, HLD, nonobstructive CAD, CVA.  Patient was recently admitted to the hospital and discharged on 6/2.  During that admission he was seen for COPD exacerbation and AKI superimposed on CKD.  He was discharged home in the custody of his nephew with whom he resides.  He then presented to the ED again on 6/4 requesting SNF placement.  He remained in the ED for 4 days pending SNF arrangements.  Ultimately SNF was found to be cost prohibitive and the patient discharged back home to his nephew on 6/8.  He presents again to the ED on this admission requesting long-term placement.  He reports he is no longer safe at home with his nephew but can also not afford skilled nursing facility placement.  ED physician had concerns of tachypnea with expiratory wheeze and requested admission for COPD exacerbation.  Patient's respiratory status appeared to be at baseline and he is medically cleared for discharge.  Stay was then prolonged pending placement.  Insurance company rejected rehab on 6/18 so discharge was canceled.  Looking into other options.    Assessment and Plan: * Housing situation unstable Rehab was rejected by [employer].  TOC looking into other options.  Tongue lesion Likely aphthous ulcer.  Magic mouthwash.  Chronic systolic CHF (congestive heart failure) (HCC) Patient euvolemic.  Holding off on diuretics secondary to kidney function.  Patient not short of breath and breathing comfortably on room air.  Continue Coreg .  Last EF shows a improved ejection fraction of 50%.  Acute kidney injury  superimposed on CKD (HCC) AKI on CKD stage IV.  Baseline creatinine around 3.5.  Patient creatinine was up at 4.7.  Holding diuretic.  Last creatinine 3.18.  With a GFR of 20.  Type II diabetes mellitus with renal manifestations (HCC) And hyperglycemia.  Continue Semglee  insulin  with sliding scale.  Atrial flutter (HCC) Status post ablation.  Continue Eliquis  for anticoagulation.  On lower dose Coreg .  Essential hypertension Continue Coreg   COPD exacerbation (HCC) Resolved.  Completed 5-day course of prednisone .  On room air.  HLD (hyperlipidemia) Continue Crestor   Iron  deficiency anemia Last hemoglobin 9  Hx of completed stroke Continue Eliquis         Subjective: Patient feels fine and offers no complaints.  Admitted 8 days ago with wheezing cough and shortness of breath.  Physical Exam: Vitals:   12/01/23 0349 12/01/23 0731 12/01/23 1536 12/01/23 1536  BP: (!) 152/73 (!) 162/80 (!) 164/85 (!) 164/85  Pulse: 68 77 69 70  Resp: 17 15 16 16   Temp: 97.7 F (36.5 C) (!) 97.4 F (36.3 C) 98.2 F (36.8 C) 98.2 F (36.8 C)  TempSrc:      SpO2: 100% 100% 100% 100%  Weight:      Height:       Physical Exam HENT:     Head: Normocephalic.   Eyes:     General: Lids are normal.     Conjunctiva/sclera: Conjunctivae normal.    Cardiovascular:     Rate and Rhythm: Normal rate and regular rhythm.     Heart sounds: Normal heart sounds, S1 normal and S2 normal.  Pulmonary:     Breath sounds: No decreased breath sounds, wheezing, rhonchi or rales.  Abdominal:     Palpations: Abdomen is soft.     Tenderness: There is no abdominal tenderness.   Musculoskeletal:     Right lower leg: No swelling.     Left lower leg: No swelling.   Skin:    General: Skin is warm.     Findings: No rash.   Neurological:     Mental Status: He is alert.     Data Reviewed: Creatinine 3.18, hemoglobin 8.4  Disposition: Status is: Observation Will need placement.  TOC looking into  options  Planned Discharge Destination: Will need placement    Time spent: 27 minutes  Author: Verla Glaze, MD 12/01/2023 3:44 PM  For on call review [email or web address].

## 2023-12-02 DIAGNOSIS — K148 Other diseases of tongue: Secondary | ICD-10-CM | POA: Diagnosis not present

## 2023-12-02 DIAGNOSIS — J441 Chronic obstructive pulmonary disease with (acute) exacerbation: Secondary | ICD-10-CM | POA: Diagnosis not present

## 2023-12-02 DIAGNOSIS — Z59819 Housing instability, housed unspecified: Secondary | ICD-10-CM | POA: Diagnosis not present

## 2023-12-02 DIAGNOSIS — I5022 Chronic systolic (congestive) heart failure: Secondary | ICD-10-CM | POA: Diagnosis not present

## 2023-12-02 LAB — GLUCOSE, CAPILLARY
Glucose-Capillary: 90 mg/dL (ref 70–99)
Glucose-Capillary: 182 mg/dL — ABNORMAL HIGH (ref 70–99)
Glucose-Capillary: 95 mg/dL (ref 70–99)
Glucose-Capillary: 92 mg/dL (ref 70–99)

## 2023-12-02 MED ORDER — INSULIN ASPART 100 UNIT/ML IJ SOLN
0.0000 [IU] | Freq: Every day | INTRAMUSCULAR | Status: DC
  Administered 2023-12-04 – 2023-12-05 (×2): 3 [IU] via SUBCUTANEOUS
  Administered 2023-12-06 – 2023-12-07 (×2): 2 [IU] via SUBCUTANEOUS
  Administered 2023-12-08: 3 [IU] via SUBCUTANEOUS
  Administered 2023-12-09: 2 [IU] via SUBCUTANEOUS
  Filled 2023-12-02 (×6): qty 1

## 2023-12-02 MED ORDER — INSULIN ASPART 100 UNIT/ML IJ SOLN
0.0000 [IU] | Freq: Three times a day (TID) | INTRAMUSCULAR | Status: DC
  Administered 2023-12-03: 2 [IU] via SUBCUTANEOUS
  Administered 2023-12-04: 1 [IU] via SUBCUTANEOUS
  Administered 2023-12-05: 7 [IU] via SUBCUTANEOUS
  Administered 2023-12-05 (×2): 5 [IU] via SUBCUTANEOUS
  Administered 2023-12-06: 7 [IU] via SUBCUTANEOUS
  Administered 2023-12-06: 3 [IU] via SUBCUTANEOUS
  Administered 2023-12-06: 5 [IU] via SUBCUTANEOUS
  Administered 2023-12-07 (×2): 9 [IU] via SUBCUTANEOUS
  Administered 2023-12-07 – 2023-12-08 (×3): 5 [IU] via SUBCUTANEOUS
  Administered 2023-12-08: 1 [IU] via SUBCUTANEOUS
  Administered 2023-12-09: 3 [IU] via SUBCUTANEOUS
  Administered 2023-12-09 (×2): 7 [IU] via SUBCUTANEOUS
  Administered 2023-12-10: 5 [IU] via SUBCUTANEOUS
  Administered 2023-12-10: 3 [IU] via SUBCUTANEOUS
  Administered 2023-12-10: 1 [IU] via SUBCUTANEOUS
  Administered 2023-12-11 (×2): 5 [IU] via SUBCUTANEOUS
  Administered 2023-12-11 – 2023-12-12 (×2): 2 [IU] via SUBCUTANEOUS
  Administered 2023-12-12: 1 [IU] via SUBCUTANEOUS
  Filled 2023-12-02 (×25): qty 1

## 2023-12-02 NOTE — Progress Notes (Signed)
 [facility]  ROUNDING NOTE   Subjective:  [name] is a 68 y.o. male with past medical conditions including CHF, COPD, CAD, stroke, diabetes, CKDIV and hypertension, who was admitted to [facility] on 11/04/2023 for COPD exacerbation (HCC) [J44.1]    Update: No acute events overnight. On room air. Reports good urine output and good appetite. No acute complaints.  Intake/Output Summary (Last 24 hours) at 12/02/2023 0910 Last data filed at 12/02/2023 9176 Gross per 24 hour  Intake 580 ml  Output 2150 ml  Net -1570 ml      Objective:  Vital signs in last 24 hours:  Temp:  [97.9 F (36.6 C)-98.9 F (37.2 C)] 97.9 F (36.6 C) (06/21 0838) Pulse Rate:  [69-76] 72 (06/21 0838) Resp:  [16] 16 (06/21 0838) BP: (143-164)/(68-85) 143/68 (06/21 0838) SpO2:  [96 %-100 %] 99 % (06/21 0838) Weight:  [90 kg] 90 kg (06/21 0354)  Weight change: 0 kg Filed Weights   11/30/23 0500 12/01/23 0349 12/02/23 0354  Weight: 90.3 kg 90 kg 90 kg    Intake/Output: I/O last 3 completed shifts: In: 700 [P.O.:700] Out: 2800 [Urine:2800]   Intake/Output this shift:  Total I/O In: -  Out: 400 [Urine:400]  Physical Exam: General: NAD,   Head: Normocephalic, atraumatic. Moist oral mucosal membranes  Eyes: Anicteric, PERRL  Neck: Supple, trachea midline  Lungs:  Room air, Clear to auscultation  Heart: Regular rate and rhythm  Abdomen:  Soft, nontender,   Extremities:  No peripheral edema.  Neurologic: Nonfocal, moving all four extremities  Skin: No lesions  Access: None    Basic Metabolic Panel: Recent Labs  Lab 11/26/23 0612 11/27/23 0310 11/28/23 0500 12/01/23 0618  NA 131* 133* 137 138  K 4.8 4.9 4.9 4.8  CL 103 105 107 111  CO2 18* 17* 21* 22  GLUCOSE 311* 345* 205* 118*  BUN 112* 118* 119* 103*  CREATININE 4.39* 4.00* 3.95* 3.18*  CALCIUM  8.0* 8.1* 8.5* 7.7*  MG 2.1 2.2 2.3  --   PHOS 4.9* 4.7* 4.7*  --     Liver Function Tests: Recent Labs  Lab  11/26/23 0612 11/27/23 0310 11/28/23 0500  AST 15 18 14*  ALT 14 18 18   ALKPHOS 51 51 49  BILITOT 0.6 0.7 0.6  PROT 5.7* 6.0* 5.5*  ALBUMIN  2.3* 2.3* 2.3*   No results for input(s): LIPASE, AMYLASE in the last 168 hours. No results for input(s): AMMONIA in the last 168 hours.  CBC: Recent Labs  Lab 11/26/23 0612 11/27/23 0310 11/28/23 0500 12/01/23 0618  WBC 4.1 4.3 7.9 6.6  NEUTROABS 2.9 3.7 6.3  --   HGB 8.8* 8.7* 9.0* 8.4*  HCT 26.5* 27.0* 26.9* 25.8*  MCV 87.2 87.1 86.8 89.0  PLT 190 198 192 150    Cardiac Enzymes: No results for input(s): CKTOTAL, CKMB, CKMBINDEX, TROPONINI in the last 168 hours.  BNP: Invalid input(s): POCBNP  CBG: Recent Labs  Lab 12/01/23 1539 12/01/23 1728 12/01/23 2053 12/01/23 2127 12/02/23 0839  GLUCAP 150* 313* 55* 85 90    Microbiology: Results for orders placed or performed during the hospital encounter of 11/22/23  Resp panel by RT-PCR (RSV, Flu A&B, Covid) Anterior Nasal Swab     Status: None   Collection Time: 11/22/23  8:26 PM   Specimen: Anterior Nasal Swab  Result Value Ref Range Status   SARS Coronavirus 2 by RT PCR NEGATIVE NEGATIVE Final    Comment: (NOTE) SARS-CoV-2 target nucleic acids are  NOT DETECTED.  The SARS-CoV-2 RNA is generally detectable in upper respiratory specimens during the acute phase of infection. The lowest concentration of SARS-CoV-2 viral copies this assay can detect is 138 copies/mL. A negative result does not preclude SARS-Cov-2 infection and should not be used as the sole basis for treatment or other patient management decisions. A negative result may occur with  improper specimen collection/handling, submission of specimen other than nasopharyngeal swab, presence of viral mutation(s) within the areas targeted by this assay, and inadequate number of viral copies(<138 copies/mL). A negative result must be combined with clinical observations, patient history, and  epidemiological information. The expected result is Negative.  Fact Sheet for Patients:  BloggerCourse.com  Fact Sheet for Healthcare Providers:  SeriousBroker.it  This test is no t yet approved or cleared by the [place]  FDA and  has been authorized for detection and/or diagnosis of SARS-CoV-2 by FDA under an Emergency Use Authorization (EUA). This EUA will remain  in effect (meaning this test can be used) for the duration of the COVID-19 declaration under Section 564(b)(1) of the Act, 21 U.S.C.section 360bbb-3(b)(1), unless the authorization is terminated  or revoked sooner.       Influenza A by PCR NEGATIVE NEGATIVE Final   Influenza B by PCR NEGATIVE NEGATIVE Final    Comment: (NOTE) The Xpert Xpress SARS-CoV-2/FLU/RSV plus assay is intended as an aid in the diagnosis of influenza from Nasopharyngeal swab specimens and should not be used as a sole basis for treatment. Nasal washings and aspirates are unacceptable for Xpert Xpress SARS-CoV-2/FLU/RSV testing.  Fact Sheet for Patients: BloggerCourse.com  Fact Sheet for Healthcare Providers: SeriousBroker.it  This test is not yet approved or cleared by the [place]  FDA and has been authorized for detection and/or diagnosis of SARS-CoV-2 by FDA under an Emergency Use Authorization (EUA). This EUA will remain in effect (meaning this test can be used) for the duration of the COVID-19 declaration under Section 564(b)(1) of the Act, 21 U.S.C. section 360bbb-3(b)(1), unless the authorization is terminated or revoked.     Resp Syncytial Virus by PCR NEGATIVE NEGATIVE Final    Comment: (NOTE) Fact Sheet for Patients: BloggerCourse.com  Fact Sheet for Healthcare Providers: SeriousBroker.it  This test is not yet approved or cleared by the [place]  FDA and has been  authorized for detection and/or diagnosis of SARS-CoV-2 by FDA under an Emergency Use Authorization (EUA). This EUA will remain in effect (meaning this test can be used) for the duration of the COVID-19 declaration under Section 564(b)(1) of the Act, 21 U.S.C. section 360bbb-3(b)(1), unless the authorization is terminated or revoked.  Performed at [facility], [address] [place], [place] [address]     Coagulation Studies: No results for input(s): LABPROT, INR in the last 72 hours.  Urinalysis: No results for input(s): COLORURINE, LABSPEC, PHURINE, GLUCOSEU, HGBUR, BILIRUBINUR, KETONESUR, PROTEINUR, UROBILINOGEN, NITRITE, LEUKOCYTESUR in the last 72 hours.  Invalid input(s): APPERANCEUR    Imaging: No results found.   Medications:     amLODipine   5 mg Oral BID   apixaban   5 mg Oral BID   budesonide -glycopyrrolate -formoterol   2 puff Inhalation BID   carvedilol   3.125 mg Oral BID WC   cyanocobalamin   1,000 mcg Oral Daily   ferrous sulfate   325 mg Oral QPM   insulin  aspart  0-20 Units Subcutaneous TID WC   insulin  aspart  0-5 Units Subcutaneous QHS   insulin  glargine-yfgn  25 Units Subcutaneous Daily   magic mouthwash w/lidocaine   5  mL Oral TID   pantoprazole   40 mg Oral Daily   rosuvastatin   10 mg Oral QHS   sodium bicarbonate   650 mg Oral TID   acetaminophen , albuterol , dextromethorphan -guaiFENesin , methocarbamol , ondansetron  (ZOFRAN ) IV, simethicone   Assessment/ Plan:  [name] is a 68 y.o.  male with past medical conditions including CHF, COPD, CAD, stroke, diabetes, CKDIV and hypertension, who was admitted to [facility] on 11/04/2023 for COPD exacerbation (HCC) [J44.1]     Acute kidney injury on chronic kidney disease IV.  Patient appears to have recurrent acute kidney injury.  Has been on and off diuretics.  Baseline creatinine appears to be 3.25 with GFR 20 on 10/17/23.  History of solitary kidney. Patient has  history of multiple cysts in the right kidney with largest being 7.9 cm x 8.8 cm. - Patient seen and evaluated at bedside.  Creatinine trending down to 3.2. Good urine output noted at 2 L.  No indication for dialysis.   Lab Results  Component Value Date   CREATININE 3.18 (H) 12/01/2023   CREATININE 3.95 (H) 11/28/2023   CREATININE 4.00 (H) 11/27/2023    2.  Hypertension Maintain amlodipine  and carvedilol  for hypertension control.  Blood pressure today 143/68   3. Anemia of chronic kidney disease Patient received blood transfusion this admission.  11/11/2023, 11/15/2023. Hemoglobin 8.4.  Consider Epogen as outpatient. Recent Labs       Lab Results  Component Value Date    HGB 8.4 (L) 12/01/2023        4. Diabetes mellitus type II with chronic kidney disease/renal manifestations: insulin  dependent. Home regimen includes Lantus  and humalog . Most recent hemoglobin A1c is 6.7 on 09/18/23.  Glucose management as per primary team.   LOS: 0 Layton Tappan SHAUNNA Dines 6/21/20258:51 AM

## 2023-12-02 NOTE — Plan of Care (Signed)

## 2023-12-02 NOTE — Progress Notes (Signed)
 PT Cancellation Note  Patient Details Name: Larry Callahan MRN: [id number] DOB: [birth year]   Cancelled Treatment:    Reason Eval/Treat Not Completed: Patient declined, no reason specified Patient supine in bed on arrival. Patient declining OOB mobility stating ma'am, I'm sick. Despite encouragement and education on importance of mobility, patient continued to decline and stated don't force me. PT will re-attempt at later date/time.   Maryanne Finder, PT, DPT Physical Therapist - [facility]  [facility]    [name] 12/02/2023, 2:38 PM

## 2023-12-02 NOTE — Progress Notes (Signed)
 Progress Note   Patient: Larry Callahan FMW:[id number] DOB: August 25, 1955 DOA: 11/22/2023     0 DOS: the patient was seen and examined on 12/02/2023   Brief hospital course:  68 year old male with multiple medical problems including: heart failure with reduced EF 40% secondary to nonischemic cardiomyopathy, a flutter status post ablation on Eliquis , CKD stage IV, hypertension, type 2 diabetes, COPD, OSA, HLD, nonobstructive CAD, CVA.  Patient was recently admitted to the hospital and discharged on 6/2.  During that admission he was seen for COPD exacerbation and AKI superimposed on CKD.  He was discharged home in the custody of his nephew with whom he resides.  He then presented to the ED again on 6/4 requesting SNF placement.  He remained in the ED for 4 days pending SNF arrangements.  Ultimately SNF was found to be cost prohibitive and the patient discharged back home to his nephew on 6/8.  He presents again to the ED on this admission requesting long-term placement.  He reports he is no longer safe at home with his nephew but can also not afford skilled nursing facility placement.  ED physician had concerns of tachypnea with expiratory wheeze and requested admission for COPD exacerbation.  Patient's respiratory status appeared to be at baseline and he is medically cleared for discharge.  Stay was then prolonged pending placement.  Insurance company rejected rehab on 6/18 so discharge was canceled.  Looking into other options.    Assessment and Plan: * Housing situation unstable Rehab was rejected by [employer].  TOC looking into other options.  Tongue lesion Likely aphthous ulcer.  Magic mouthwash.  Chronic systolic CHF (congestive heart failure) (HCC) Patient euvolemic.  Holding off on diuretics secondary to kidney function.  Patient not short of breath and breathing comfortably on room air.  Continue Coreg .  Last EF shows a improved ejection fraction of 50%.  Acute kidney injury  superimposed on CKD (HCC) AKI on CKD stage IV.  Baseline creatinine around 3.5.  Patient creatinine was up at 4.7.  Holding diuretic.  Last creatinine 3.18.  With a GFR of 20.  Type II diabetes mellitus with renal manifestations (HCC) And hyperglycemia.  Patient had a low sugar yesterday evening secondary to likely aggressive over replacement with sliding scale for high sugar.  Continue Semglee  insulin  with sliding scale renally dosed.  Atrial flutter (HCC) Status post ablation.  Continue Eliquis  for anticoagulation.  On lower dose Coreg .  Essential hypertension Continue Coreg   COPD exacerbation (HCC) Resolved.  Completed 5-day course of prednisone .  On room air.  HLD (hyperlipidemia) Continue Crestor   Iron  deficiency anemia Last hemoglobin 8.4  Hx of completed stroke Continue Eliquis         Subjective: Patient feels okay.  Offers no complaints.  Admitted with COPD exacerbation.  Physical Exam: Vitals:   12/01/23 1536 12/01/23 2042 12/02/23 0354 12/02/23 0838  BP: (!) 164/85 (!) 154/73  (!) 143/68  Pulse: 70 76  72  Resp: 16 16  16   Temp: 98.2 F (36.8 C) 98.9 F (37.2 C)  97.9 F (36.6 C)  TempSrc:  Oral    SpO2: 100% 96%  99%  Weight:   90 kg   Height:       Physical Exam HENT:     Head: Normocephalic.   Eyes:     General: Lids are normal.     Conjunctiva/sclera: Conjunctivae normal.    Cardiovascular:     Rate and Rhythm: Normal rate and regular rhythm.  Heart sounds: Normal heart sounds, S1 normal and S2 normal.  Pulmonary:     Breath sounds: No decreased breath sounds, wheezing, rhonchi or rales.  Abdominal:     Palpations: Abdomen is soft.     Tenderness: There is no abdominal tenderness.   Musculoskeletal:     Right lower leg: No swelling.     Left lower leg: No swelling.   Skin:    General: Skin is warm.     Findings: No rash.   Neurological:     Mental Status: He is alert.     Data Reviewed: Sugar last night  55.  Disposition: Status is: Observation TOC looking into options  Planned Discharge Destination: Will need placement    Time spent: 27 minutes  Author: Charlie Patterson, MD 12/02/2023 2:36 PM  For on call review [email or web address].

## 2023-12-03 DIAGNOSIS — I5022 Chronic systolic (congestive) heart failure: Secondary | ICD-10-CM | POA: Diagnosis not present

## 2023-12-03 DIAGNOSIS — K148 Other diseases of tongue: Secondary | ICD-10-CM | POA: Diagnosis not present

## 2023-12-03 DIAGNOSIS — Z59819 Housing instability, housed unspecified: Secondary | ICD-10-CM | POA: Diagnosis not present

## 2023-12-03 DIAGNOSIS — N179 Acute kidney failure, unspecified: Secondary | ICD-10-CM | POA: Diagnosis not present

## 2023-12-03 LAB — GLUCOSE, CAPILLARY
Glucose-Capillary: 83 mg/dL (ref 70–99)
Glucose-Capillary: 115 mg/dL — ABNORMAL HIGH (ref 70–99)
Glucose-Capillary: 177 mg/dL — ABNORMAL HIGH (ref 70–99)
Glucose-Capillary: 154 mg/dL — ABNORMAL HIGH (ref 70–99)

## 2023-12-03 MED ORDER — INSULIN GLARGINE-YFGN 100 UNIT/ML ~~LOC~~ SOLN
21.0000 [IU] | Freq: Every day | SUBCUTANEOUS | Status: DC
  Administered 2023-12-04: 21 [IU] via SUBCUTANEOUS
  Filled 2023-12-03: qty 0.21

## 2023-12-03 NOTE — Progress Notes (Signed)
 Physical Therapy Treatment Patient Details Name: Larry Callahan MRN: [id number] DOB: [birth year] Today's Date: 12/03/2023   History of Present Illness Larry Callahan is a 68 year old male with history of CHF, a flutter on Eliquis , CKD, T2DM, and CAD who presenting to the ER for housing concerns. MD diagnoses includes COPD, CHF, HTN, atrial flutter, type II diabetes, HLD, and CAD.    PT Comments  Refused AM but agreed after lunch.  He requests help for bed mobility and light assist is given to progress session.  Stands and walks to bathroom to void.  Inc urine in bed and gown wet.  Refuses bath initially but does with max encouragement and education.  Stood for bath with intermittent UE support with set up assist.  He then walks x 2 laps on unit with RW and cga x 1.  Slow steady gait.  C/O SOB throughout but does opt to walk x 2 laps.  Sats high 90's when seated on room air.  Refuses chair and return to bed with distant supervision.  Generally grumpy and short during session.   If plan is discharge home, recommend the following: A little help with walking and/or transfers;A little help with bathing/dressing/bathroom;Assistance with cooking/housework;Assist for transportation;Help with stairs or ramp for entrance   Can travel by private vehicle        Equipment Recommendations  Rolling walker (2 wheels)    Recommendations for Other Services       Precautions / Restrictions Precautions Precautions: Fall Recall of Precautions/Restrictions: Impaired Restrictions Weight Bearing Restrictions Per Provider Order: No     Mobility  Bed Mobility Overal bed mobility: Needs Assistance Bed Mobility: Supine to Sit, Sit to Supine   Sidelying to sit: Min assist Supine to sit: Modified independent (Device/Increase time)     General bed mobility comments: asks for help but does not really seem to need it.  seems more behavioral Patient Response:  (grumpy and generally rude during  session)  Transfers   Equipment used: Rolling walker (2 wheels) Transfers: Sit to/from Stand Sit to Stand: Contact guard assist                Ambulation/Gait Ambulation/Gait assistance: Contact guard assist Gait Distance (Feet): 320 Feet Assistive device: Rolling walker (2 wheels) Gait Pattern/deviations: Step-through pattern Gait velocity: decreased     General Gait Details: x 2 laps.  c/o SOB but sats remain high 90's with gait   Stairs             Wheelchair Mobility     Tilt Bed Tilt Bed Patient Response:  (grumpy and generally rude during session)  Modified Rankin (Stroke Patients Only)       Balance Overall balance assessment: History of Falls, Needs assistance   Sitting balance-Leahy Scale: Normal     Standing balance support: No upper extremity supported Standing balance-Leahy Scale: Good Standing balance comment: able to bathe in standing without UE support or LOB                            Communication Communication Communication: Impaired Factors Affecting Communication: Hearing impaired  Cognition Arousal: Alert Behavior During Therapy: WFL for tasks assessed/performed   PT - Cognitive impairments: No family/caregiver present to determine baseline                         Following commands: Impaired Following commands impaired: Follows one step commands inconsistently, Follows  multi-step commands inconsistently    Cueing Cueing Techniques: Verbal cues, Tactile cues  Exercises Other Exercises Other Exercises: pt inc of urine in bed and on gown.  to bathroom for BM and max encouragement for bathing which he does do with set up assist.    General Comments        Pertinent Vitals/Pain Pain Assessment Pain Assessment: No/denies pain Pain Intervention(s): Monitored during session    Home Living                          Prior Function            PT Goals (current goals can now be found in  the care plan section) Progress towards PT goals: Progressing toward goals    Frequency    Min 2X/week      PT Plan      Co-evaluation              AM-PAC PT 6 Clicks Mobility   Outcome Measure  Help needed turning from your back to your side while in a flat bed without using bedrails?: None Help needed moving from lying on your back to sitting on the side of a flat bed without using bedrails?: None Help needed moving to and from a bed to a chair (including a wheelchair)?: A Little Help needed standing up from a chair using your arms (e.g., wheelchair or bedside chair)?: A Little Help needed to walk in hospital room?: A Little Help needed climbing 3-5 steps with a railing? : A Little 6 Click Score: 20    End of Session Equipment Utilized During Treatment: Gait belt Activity Tolerance: Patient tolerated treatment well Patient left: in bed;with call bell/phone within reach;with bed alarm set Nurse Communication: Mobility status PT Visit Diagnosis: Repeated falls (R29.6);History of falling (Z91.81);Unsteadiness on feet (R26.81);Muscle weakness (generalized) (M62.81)     Time: 8668-8648 PT Time Calculation (min) (ACUTE ONLY): 20 min  Charges:    $Gait Training: 8-22 mins PT General Charges $$ ACUTE PT VISIT: 1 Visit                    Lauraine Gills, PTA 12/03/23, 2:18 PM

## 2023-12-03 NOTE — Progress Notes (Signed)
 Progress Note   Patient: Larry Callahan FMW:[id number] DOB: [birth year] DOA: 11/22/2023     0 DOS: the patient was seen and examined on 12/03/2023   Brief hospital course:  68 year old male with multiple medical problems including: heart failure with reduced EF 40% secondary to nonischemic cardiomyopathy, a flutter status post ablation on Eliquis , CKD stage IV, hypertension, type 2 diabetes, COPD, OSA, HLD, nonobstructive CAD, CVA.  Patient was recently admitted to the hospital and discharged on 6/2.  During that admission he was seen for COPD exacerbation and AKI superimposed on CKD.  He was discharged home in the custody of his nephew with whom he resides.  He then presented to the ED again on 6/4 requesting SNF placement.  He remained in the ED for 4 days pending SNF arrangements.  Ultimately SNF was found to be cost prohibitive and the patient discharged back home to his nephew on 6/8.  He presents again to the ED on this admission requesting long-term placement.  He reports he is no longer safe at home with his nephew but can also not afford skilled nursing facility placement.  ED physician had concerns of tachypnea with expiratory wheeze and requested admission for COPD exacerbation.  Patient's respiratory status appeared to be at baseline and he is medically cleared for discharge.  Stay was then prolonged pending placement.  Insurance company rejected rehab on 6/18 so discharge was canceled.  Looking into other options.    Assessment and Plan: * Housing situation unstable Rehab was rejected by [employer].  TOC looking into other options.  Tongue lesion Likely aphthous ulcer.  Magic mouthwash.  Chronic systolic CHF (congestive heart failure) (HCC) Patient euvolemic.  Holding off on diuretics secondary to kidney function.  Patient not short of breath and breathing comfortably on room air.  Continue Coreg .  Last EF shows a improved ejection fraction of 50%.  Acute kidney injury  superimposed on CKD (HCC) AKI on CKD stage IV.  Baseline creatinine around 3.5.  Patient creatinine was up at 4.7.  Holding diuretic.  Last creatinine 3.18.  With a GFR of 20.  Will check BMP tomorrow morning.  Type II diabetes mellitus with renal manifestations (HCC) And hyperglycemia.  Will decrease Semglee  insulin  down to 21 units since sugars on the lower side.  Continue sliding scale.  Atrial flutter (HCC) Status post ablation.  Continue Eliquis  for anticoagulation.  On lower dose Coreg .  Essential hypertension Continue Coreg , Norvasc   COPD exacerbation (HCC) Resolved.  Completed 5-day course of prednisone .  On room air.  HLD (hyperlipidemia) Continue Crestor   Iron  deficiency anemia Last hemoglobin 8.4  Hx of completed stroke Continue Eliquis         Subjective: Patient seen this morning and was sleeping.  Patient also seen while ambulating this afternoon with physical therapy.  Initially admitted with COPD exacerbation.  Physical Exam: Vitals:   12/02/23 2000 12/03/23 0454 12/03/23 0500 12/03/23 0750  BP: (!) 153/72 (!) 161/73  (!) 155/85  Pulse: 74 73  75  Resp: 18 16  18   Temp: 98 F (36.7 C) (!) 96.5 F (35.8 C)  97.7 F (36.5 C)  TempSrc: Axillary     SpO2: 96% 98%  97%  Weight:   89.3 kg   Height:       Physical Exam HENT:     Head: Normocephalic.   Eyes:     General: Lids are normal.     Conjunctiva/sclera: Conjunctivae normal.    Cardiovascular:     Rate  and Rhythm: Normal rate and regular rhythm.     Heart sounds: Normal heart sounds, S1 normal and S2 normal.  Pulmonary:     Breath sounds: No decreased breath sounds, wheezing, rhonchi or rales.  Abdominal:     Palpations: Abdomen is soft.     Tenderness: There is no abdominal tenderness.   Musculoskeletal:     Right lower leg: No swelling.     Left lower leg: No swelling.   Skin:    General: Skin is warm.     Findings: No rash.   Neurological:     Mental Status: He is alert.      Data Reviewed: No new data   Disposition: Status is: Observation TOC working on other placement options  Planned Discharge Destination: Needs placement    Time spent: 26 minutes  Author: Charlie Patterson, MD 12/03/2023 2:45 PM  For on call review [email or web address].

## 2023-12-03 NOTE — Plan of Care (Signed)
  Problem: Health Behavior/Discharge Planning: Goal: Ability to manage health-related needs will improve Outcome: Progressing   Problem: Clinical Measurements: Goal: Ability to maintain clinical measurements within normal limits will improve Outcome: Progressing Goal: Will remain free from infection Outcome: Progressing Goal: Diagnostic test results will improve Outcome: Progressing Goal: Respiratory complications will improve Outcome: Progressing Goal: Cardiovascular complication will be avoided Outcome: Progressing   Problem: Activity: Goal: Risk for activity intolerance will decrease Outcome: Progressing   Problem: Nutrition: Goal: Adequate nutrition will be maintained Outcome: Progressing   Problem: Coping: Goal: Level of anxiety will decrease Outcome: Progressing   Problem: Elimination: Goal: Will not experience complications related to bowel motility Outcome: Progressing Goal: Will not experience complications related to urinary retention Outcome: Progressing   Problem: Pain Managment: Goal: General experience of comfort will improve and/or be controlled Outcome: Progressing   Problem: Safety: Goal: Ability to remain free from injury will improve Outcome: Progressing   Problem: Skin Integrity: Goal: Risk for impaired skin integrity will decrease Outcome: Progressing   Problem: Education: Goal: Knowledge of General Education information will improve Description: Including pain rating scale, medication(s)/side effects and non-pharmacologic comfort measures Outcome: Not Progressing

## 2023-12-03 NOTE — Progress Notes (Signed)
 [facility]  ROUNDING NOTE   Subjective:  Patient seen in room eating breakfast. Endorses good appetite, reports continued good uringe output. No complaints to offer.    Objective:  Vital signs in last 24 hours:  Temp:  [96.5 F (35.8 C)-98 F (36.7 C)] 97.7 F (36.5 C) (06/22 0750) Pulse Rate:  [72-75] 75 (06/22 0750) Resp:  [16-18] 18 (06/22 0750) BP: (147-161)/(63-85) 155/85 (06/22 0750) SpO2:  [96 %-100 %] 97 % (06/22 0750) Weight:  [89.3 kg] 89.3 kg (06/22 0500)  Weight change: -0.7 kg Filed Weights   12/01/23 0349 12/02/23 0354 12/03/23 0500  Weight: 90 kg 90 kg 89.3 kg    Intake/Output: I/O last 3 completed shifts: In: 940 [P.O.:940] Out: 2100 [Urine:2100]   Intake/Output this shift:  No intake/output data recorded.  Physical Exam: General: NAD,   Head: Normocephalic, atraumatic. Moist oral mucosal membranes  Eyes: Anicteric, PERRL  Neck: Supple, trachea midline  Lungs:  Clear to auscultation  Heart: Regular rate and rhythm  Abdomen:  Soft, nontender,   Extremities:  No peripheral edema.  Neurologic: Nonfocal, moving all four extremities  Skin: No lesions  Access: None    Basic Metabolic Panel: Recent Labs  Lab 11/27/23 0310 11/28/23 0500 12/01/23 0618  NA 133* 137 138  K 4.9 4.9 4.8  CL 105 107 111  CO2 17* 21* 22  GLUCOSE 345* 205* 118*  BUN 118* 119* 103*  CREATININE 4.00* 3.95* 3.18*  CALCIUM  8.1* 8.5* 7.7*  MG 2.2 2.3  --   PHOS 4.7* 4.7*  --     Liver Function Tests: Recent Labs  Lab 11/27/23 0310 11/28/23 0500  AST 18 14*  ALT 18 18  ALKPHOS 51 49  BILITOT 0.7 0.6  PROT 6.0* 5.5*  ALBUMIN  2.3* 2.3*   No results for input(s): LIPASE, AMYLASE in the last 168 hours. No results for input(s): AMMONIA in the last 168 hours.  CBC: Recent Labs  Lab 11/27/23 0310 11/28/23 0500 12/01/23 0618  WBC 4.3 7.9 6.6  NEUTROABS 3.7 6.3  --   HGB 8.7* 9.0* 8.4*  HCT 27.0* 26.9* 25.8*  MCV 87.1 86.8 89.0  PLT 198  192 150    Cardiac Enzymes: No results for input(s): CKTOTAL, CKMB, CKMBINDEX, TROPONINI in the last 168 hours.  BNP: Invalid input(s): POCBNP  CBG: Recent Labs  Lab 12/02/23 0839 12/02/23 1138 12/02/23 1632 12/02/23 2002 12/03/23 0749  GLUCAP 90 182* 95 92 83    Microbiology: Results for orders placed or performed during the hospital encounter of 11/22/23  Resp panel by RT-PCR (RSV, Flu A&B, Covid) Anterior Nasal Swab     Status: None   Collection Time: 11/22/23  8:26 PM   Specimen: Anterior Nasal Swab  Result Value Ref Range Status   SARS Coronavirus 2 by RT PCR NEGATIVE NEGATIVE Final    Comment: (NOTE) SARS-CoV-2 target nucleic acids are NOT DETECTED.  The SARS-CoV-2 RNA is generally detectable in upper respiratory specimens during the acute phase of infection. The lowest concentration of SARS-CoV-2 viral copies this assay can detect is 138 copies/mL. A negative result does not preclude SARS-Cov-2 infection and should not be used as the sole basis for treatment or other patient management decisions. A negative result may occur with  improper specimen collection/handling, submission of specimen other than nasopharyngeal swab, presence of viral mutation(s) within the areas targeted by this assay, and inadequate number of viral copies(<138 copies/mL). A negative result must be combined with clinical observations, patient history, and  epidemiological information. The expected result is Negative.  Fact Sheet for Patients:  BloggerCourse.com  Fact Sheet for Healthcare Providers:  SeriousBroker.it  This test is no t yet approved or cleared by the [place]  FDA and  has been authorized for detection and/or diagnosis of SARS-CoV-2 by FDA under an Emergency Use Authorization (EUA). This EUA will remain  in effect (meaning this test can be used) for the duration of the COVID-19 declaration under Section  564(b)(1) of the Act, 21 U.S.C.section 360bbb-3(b)(1), unless the authorization is terminated  or revoked sooner.       Influenza A by PCR NEGATIVE NEGATIVE Final   Influenza B by PCR NEGATIVE NEGATIVE Final    Comment: (NOTE) The Xpert Xpress SARS-CoV-2/FLU/RSV plus assay is intended as an aid in the diagnosis of influenza from Nasopharyngeal swab specimens and should not be used as a sole basis for treatment. Nasal washings and aspirates are unacceptable for Xpert Xpress SARS-CoV-2/FLU/RSV testing.  Fact Sheet for Patients: BloggerCourse.com  Fact Sheet for Healthcare Providers: SeriousBroker.it  This test is not yet approved or cleared by the [place]  FDA and has been authorized for detection and/or diagnosis of SARS-CoV-2 by FDA under an Emergency Use Authorization (EUA). This EUA will remain in effect (meaning this test can be used) for the duration of the COVID-19 declaration under Section 564(b)(1) of the Act, 21 U.S.C. section 360bbb-3(b)(1), unless the authorization is terminated or revoked.     Resp Syncytial Virus by PCR NEGATIVE NEGATIVE Final    Comment: (NOTE) Fact Sheet for Patients: BloggerCourse.com  Fact Sheet for Healthcare Providers: SeriousBroker.it  This test is not yet approved or cleared by the [place]  FDA and has been authorized for detection and/or diagnosis of SARS-CoV-2 by FDA under an Emergency Use Authorization (EUA). This EUA will remain in effect (meaning this test can be used) for the duration of the COVID-19 declaration under Section 564(b)(1) of the Act, 21 U.S.C. section 360bbb-3(b)(1), unless the authorization is terminated or revoked.  Performed at [facility], [address] [place], [place] [address]     Coagulation Studies: No results for input(s): LABPROT, INR in the last 72 hours.  Urinalysis: No  results for input(s): COLORURINE, LABSPEC, PHURINE, GLUCOSEU, HGBUR, BILIRUBINUR, KETONESUR, PROTEINUR, UROBILINOGEN, NITRITE, LEUKOCYTESUR in the last 72 hours.  Invalid input(s): APPERANCEUR    Imaging: No results found.   Medications:     amLODipine   5 mg Oral BID   apixaban   5 mg Oral BID   budesonide -glycopyrrolate -formoterol   2 puff Inhalation BID   carvedilol   3.125 mg Oral BID WC   cyanocobalamin   1,000 mcg Oral Daily   ferrous sulfate   325 mg Oral QPM   insulin  aspart  0-5 Units Subcutaneous QHS   insulin  aspart  0-9 Units Subcutaneous TID WC   insulin  glargine-yfgn  25 Units Subcutaneous Daily   magic mouthwash w/lidocaine   5 mL Oral TID   pantoprazole   40 mg Oral Daily   rosuvastatin   10 mg Oral QHS   sodium bicarbonate   650 mg Oral TID   acetaminophen , albuterol , dextromethorphan -guaiFENesin , methocarbamol , ondansetron  (ZOFRAN ) IV, simethicone   Assessment/ Plan:  Larry Callahan is a 68 y.o.  male with past medical conditions including CHF, COPD, CAD, stroke, diabetes, CKDIV and hypertension, who was admitted to [facility] on 11/04/2023 for COPD exacerbation (HCC) [J44.1]     Acute kidney injury on chronic kidney disease IV.  Patient appears to have recurrent acute kidney injury.  Has been on and off  diuretics.  Baseline creatinine appears to be 3.25 with GFR 20 on 10/17/23.  History of solitary kidney. 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 - Patient seen and evaluated at bedside.  Creatinine trending down to 3.2. Good urine output reported.  No indication for dialysis.   Lab Results  Component Value Date   CREATININE 3.18 (H) 12/01/2023   CREATININE 3.95 (H) 11/28/2023   CREATININE 4.00 (H) 11/27/2023    Intake/Output Summary (Last 24 hours) at 12/03/2023 0846 Last data filed at 12/03/2023 0600 Gross per 24 hour  Intake 600 ml  Output 700 ml  Net -100 ml    2.   Hypertension Maintain amlodipine  and carvedilol  for hypertension control.  Blood pressure 155/85   3. Anemia of chronic kidney disease Patient received blood transfusion this admission.  11/11/2023, 11/15/2023. Hemoglobin 8.4.  Consider Epogen as outpatient. Recent Labs           Lab Results  Component Value Date    HGB 8.4 (L) 12/01/2023        4. Diabetes mellitus type II with chronic kidney disease/renal manifestations: insulin  dependent. Home regimen includes Lantus  and humalog . Most recent hemoglobin A1c is 6.7 on 09/18/23.  Glucose management as per primary team.     LOS: 0 Gareth Fitzner SHAUNNA Dines 6/22/20258:45 AM

## 2023-12-03 NOTE — Plan of Care (Signed)

## 2023-12-04 DIAGNOSIS — K148 Other diseases of tongue: Secondary | ICD-10-CM | POA: Diagnosis not present

## 2023-12-04 DIAGNOSIS — I5022 Chronic systolic (congestive) heart failure: Secondary | ICD-10-CM | POA: Diagnosis not present

## 2023-12-04 DIAGNOSIS — R220 Localized swelling, mass and lump, head: Secondary | ICD-10-CM | POA: Diagnosis not present

## 2023-12-04 DIAGNOSIS — Z59819 Housing instability, housed unspecified: Secondary | ICD-10-CM | POA: Diagnosis not present

## 2023-12-04 LAB — BASIC METABOLIC PANEL WITH GFR
Anion gap: 5 (ref 5–15)
BUN: 85 mg/dL — ABNORMAL HIGH (ref 8–23)
CO2: 22 mmol/L (ref 22–32)
Calcium: 7.9 mg/dL — ABNORMAL LOW (ref 8.9–10.3)
Chloride: 112 mmol/L — ABNORMAL HIGH (ref 98–111)
Creatinine, Ser: 3.26 mg/dL — ABNORMAL HIGH (ref 0.61–1.24)
GFR, Estimated: 20 mL/min — ABNORMAL LOW (ref >60)
Glucose, Bld: 82 mg/dL (ref 70–99)
Potassium: 4.7 mmol/L (ref 3.5–5.1)
Sodium: 139 mmol/L (ref 135–145)

## 2023-12-04 LAB — GLUCOSE, CAPILLARY
Glucose-Capillary: 66 mg/dL — ABNORMAL LOW (ref 70–99)
Glucose-Capillary: 55 mg/dL — ABNORMAL LOW (ref 70–99)
Glucose-Capillary: 88 mg/dL (ref 70–99)
Glucose-Capillary: 129 mg/dL — ABNORMAL HIGH (ref 70–99)
Glucose-Capillary: 263 mg/dL — ABNORMAL HIGH (ref 70–99)

## 2023-12-04 LAB — HEMOGLOBIN: Hemoglobin: 9.5 g/dL — ABNORMAL LOW (ref 13.0–17.0)

## 2023-12-04 MED ORDER — METRONIDAZOLE 500 MG/100ML IV SOLN
500.0000 mg | Freq: Two times a day (BID) | INTRAVENOUS | Status: AC
  Administered 2023-12-04 – 2023-12-10 (×14): 500 mg via INTRAVENOUS
  Filled 2023-12-04 (×14): qty 100

## 2023-12-04 MED ORDER — INSULIN GLARGINE-YFGN 100 UNIT/ML ~~LOC~~ SOLN
18.0000 [IU] | Freq: Every day | SUBCUTANEOUS | Status: DC
  Filled 2023-12-04: qty 0.18

## 2023-12-04 MED ORDER — SODIUM CHLORIDE 0.9 % IV SOLN
2.0000 g | INTRAVENOUS | Status: AC
  Administered 2023-12-04 – 2023-12-10 (×7): 2 g via INTRAVENOUS
  Filled 2023-12-04 (×7): qty 20

## 2023-12-04 MED ORDER — MENTHOL 3 MG MT LOZG: 1.0000 | LOZENGE | OROMUCOSAL | Status: DC | PRN

## 2023-12-04 MED ORDER — DEXAMETHASONE SODIUM PHOSPHATE 10 MG/ML IJ SOLN
10.0000 mg | Freq: Two times a day (BID) | INTRAMUSCULAR | Status: DC
  Administered 2023-12-04 – 2023-12-06 (×5): 10 mg via INTRAVENOUS
  Filled 2023-12-04 (×5): qty 1

## 2023-12-04 NOTE — Plan of Care (Signed)

## 2023-12-04 NOTE — Progress Notes (Signed)
 Physical Therapy Treatment Patient Details Name: Larry Callahan MRN: [id number] DOB: [birth year] Today's Date: 12/04/2023   History of Present Illness Kvion Shapley is a 68 year old male with history of CHF, a flutter on Eliquis , CKD, T2DM, and CAD who presenting to the ER for housing concerns. MD diagnoses includes COPD, CHF, HTN, atrial flutter, type II diabetes, HLD, and CAD.    PT Comments  Completes x 2 laps on unit with RW and cga/supervision.  Declined further activity.   If plan is discharge home, recommend the following: A little help with walking and/or transfers;A little help with bathing/dressing/bathroom;Assistance with cooking/housework;Assist for transportation;Help with stairs or ramp for entrance   Can travel by private vehicle        Equipment Recommendations  Rolling walker (2 wheels)    Recommendations for Other Services       Precautions / Restrictions Precautions Precautions: Fall Recall of Precautions/Restrictions: Impaired Restrictions Weight Bearing Restrictions Per Provider Order: No     Mobility  Bed Mobility               General bed mobility comments: sitting EOB before and after    Transfers Overall transfer level: Needs assistance Equipment used: Rolling walker (2 wheels) Transfers: Sit to/from Stand Sit to Stand: Supervision                Ambulation/Gait Ambulation/Gait assistance: Contact guard assist Gait Distance (Feet): 320 Feet Assistive device: Rolling walker (2 wheels) Gait Pattern/deviations: Step-through pattern Gait velocity: decreased     General Gait Details: x 2 laps.  c/o SOB but sats remain high 90's with gait   Stairs             Wheelchair Mobility     Tilt Bed    Modified Rankin (Stroke Patients Only)       Balance Overall balance assessment: History of Falls, Needs assistance   Sitting balance-Leahy Scale: Normal     Standing balance support: No upper extremity  supported Standing balance-Leahy Scale: Good Standing balance comment: able to bathe in standing without UE support or LOB                            Communication Communication Communication: Impaired Factors Affecting Communication: Hearing impaired  Cognition Arousal: Alert Behavior During Therapy: WFL for tasks assessed/performed   PT - Cognitive impairments: No family/caregiver present to determine baseline                         Following commands: Intact Following commands impaired: Only follows one step commands consistently    Cueing Cueing Techniques: Verbal cues, Tactile cues  Exercises      General Comments        Pertinent Vitals/Pain Pain Assessment Pain Assessment: No/denies pain    Home Living                          Prior Function            PT Goals (current goals can now be found in the care plan section) Progress towards PT goals: Progressing toward goals    Frequency    Min 2X/week      PT Plan      Co-evaluation              AM-PAC PT 6 Clicks Mobility   Outcome Measure  Help needed turning  from your back to your side while in a flat bed without using bedrails?: None Help needed moving from lying on your back to sitting on the side of a flat bed without using bedrails?: None Help needed moving to and from a bed to a chair (including a wheelchair)?: None Help needed standing up from a chair using your arms (e.g., wheelchair or bedside chair)?: None Help needed to walk in hospital room?: A Little Help needed climbing 3-5 steps with a railing? : A Little 6 Click Score: 22    End of Session Equipment Utilized During Treatment: Gait belt Activity Tolerance: Patient tolerated treatment well Patient left: in bed;with call bell/phone within reach;with bed alarm set Nurse Communication: Mobility status PT Visit Diagnosis: Repeated falls (R29.6);History of falling (Z91.81);Unsteadiness on feet  (R26.81);Muscle weakness (generalized) (M62.81)     Time: 8689-8677 PT Time Calculation (min) (ACUTE ONLY): 12 min  Charges:    $Gait Training: 8-22 mins PT General Charges $$ ACUTE PT VISIT: 1 Visit                   Lauraine Gills, PTA 12/04/23, 1:28 PM

## 2023-12-04 NOTE — Progress Notes (Signed)
 [facility]  ROUNDING NOTE   Subjective:  Larry Callahan is a 68 y.o. male with past medical conditions including CHF, COPD, CAD, stroke, diabetes, CKDIV and hypertension, who was admitted to [facility] on 11/04/2023 for COPD exacerbation (HCC) [J44.1]   Update: Creatinine slightly higher today at 3.2. Urine output 790 cc of the patient 24 hours. 06/22 0701 - 06/23 0700 In: 240 [P.O.:240] Out: 790 [Urine:790]    Objective:  Vital signs in last 24 hours:  Temp:  [97.6 F (36.4 C)-98.4 F (36.9 C)] 97.6 F (36.4 C) (06/23 0818) Pulse Rate:  [73-106] 106 (06/23 0818) Resp:  [17-20] 17 (06/23 0818) BP: (149-159)/(65-90) 159/90 (06/23 0818) SpO2:  [98 %-100 %] 98 % (06/23 0818) Weight:  [88.7 kg] 88.7 kg (06/23 0519)  Weight change: -0.6 kg Filed Weights   12/02/23 0354 12/03/23 0500 12/04/23 0519  Weight: 90 kg 89.3 kg 88.7 kg    Intake/Output: I/O last 3 completed shifts: In: 600 [P.O.:600] Out: 1490 [Urine:1490]   Intake/Output this shift:  No intake/output data recorded.  Physical Exam: General: NAD  Head: Normocephalic, atraumatic. Moist oral mucosal membranes  Eyes: Anicteric  Neck: Supple  Lungs:  Normal breathing effort on room air  Heart: Regular rate and rhythm  Abdomen:  Soft, nontender, BS present  Extremities: Trace peripheral edema.  Neurologic: Nonfocal, moving all four extremities  Skin: No lesions  Access: None    Basic Metabolic Panel: Recent Labs  Lab 11/28/23 0500 12/01/23 0618 12/04/23 0430  NA 137 138 139  K 4.9 4.8 4.7  CL 107 111 112*  CO2 21* 22 22  GLUCOSE 205* 118* 82  BUN 119* 103* 85*  CREATININE 3.95* 3.18* 3.26*  CALCIUM  8.5* 7.7* 7.9*  MG 2.3  --   --   PHOS 4.7*  --   --     Liver Function Tests: Recent Labs  Lab 11/28/23 0500  AST 14*  ALT 18  ALKPHOS 49  BILITOT 0.6  PROT 5.5*  ALBUMIN  2.3*   No results for input(s): LIPASE, AMYLASE in the last 168 hours. No results for input(s):  AMMONIA in the last 168 hours.  CBC: Recent Labs  Lab 11/28/23 0500 12/01/23 0618 12/04/23 0430  WBC 7.9 6.6  --   NEUTROABS 6.3  --   --   HGB 9.0* 8.4* 9.5*  HCT 26.9* 25.8*  --   MCV 86.8 89.0  --   PLT 192 150  --     Cardiac Enzymes: No results for input(s): CKTOTAL, CKMB, CKMBINDEX, TROPONINI in the last 168 hours.  BNP: Invalid input(s): POCBNP  CBG: Recent Labs  Lab 12/03/23 0749 12/03/23 1147 12/03/23 1647 12/03/23 2048 12/04/23 0815  GLUCAP 83 115* 177* 154* 66*    Microbiology: Results for orders placed or performed during the hospital encounter of 11/22/23  Resp panel by RT-PCR (RSV, Flu A&B, Covid) Anterior Nasal Swab     Status: None   Collection Time: 11/22/23  8:26 PM   Specimen: Anterior Nasal Swab  Result Value Ref Range Status   SARS Coronavirus 2 by RT PCR NEGATIVE NEGATIVE Final    Comment: (NOTE) SARS-CoV-2 target nucleic acids are NOT DETECTED.  The SARS-CoV-2 RNA is generally detectable in upper respiratory specimens during the acute phase of infection. The lowest concentration of SARS-CoV-2 viral copies this assay can detect is 138 copies/mL. A negative result does not preclude SARS-Cov-2 infection and should not be used as the sole basis for treatment or other  patient management decisions. A negative result may occur with  improper specimen collection/handling, submission of specimen other than nasopharyngeal swab, presence of viral mutation(s) within the areas targeted by this assay, and inadequate number of viral copies(<138 copies/mL). A negative result must be combined with clinical observations, patient history, and epidemiological information. The expected result is Negative.  Fact Sheet for Patients:  BloggerCourse.com  Fact Sheet for Healthcare Providers:  SeriousBroker.it  This test is no t yet approved or cleared by the [place]  FDA and  has been  authorized for detection and/or diagnosis of SARS-CoV-2 by FDA under an Emergency Use Authorization (EUA). This EUA will remain  in effect (meaning this test can be used) for the duration of the COVID-19 declaration under Section 564(b)(1) of the Act, 21 U.S.C.section 360bbb-3(b)(1), unless the authorization is terminated  or revoked sooner.       Influenza A by PCR NEGATIVE NEGATIVE Final   Influenza B by PCR NEGATIVE NEGATIVE Final    Comment: (NOTE) The Xpert Xpress SARS-CoV-2/FLU/RSV plus assay is intended as an aid in the diagnosis of influenza from Nasopharyngeal swab specimens and should not be used as a sole basis for treatment. Nasal washings and aspirates are unacceptable for Xpert Xpress SARS-CoV-2/FLU/RSV testing.  Fact Sheet for Patients: BloggerCourse.com  Fact Sheet for Healthcare Providers: SeriousBroker.it  This test is not yet approved or cleared by the [place]  FDA and has been authorized for detection and/or diagnosis of SARS-CoV-2 by FDA under an Emergency Use Authorization (EUA). This EUA will remain in effect (meaning this test can be used) for the duration of the COVID-19 declaration under Section 564(b)(1) of the Act, 21 U.S.C. section 360bbb-3(b)(1), unless the authorization is terminated or revoked.     Resp Syncytial Virus by PCR NEGATIVE NEGATIVE Final    Comment: (NOTE) Fact Sheet for Patients: BloggerCourse.com  Fact Sheet for Healthcare Providers: SeriousBroker.it  This test is not yet approved or cleared by the [place]  FDA and has been authorized for detection and/or diagnosis of SARS-CoV-2 by FDA under an Emergency Use Authorization (EUA). This EUA will remain in effect (meaning this test can be used) for the duration of the COVID-19 declaration under Section 564(b)(1) of the Act, 21 U.S.C. section 360bbb-3(b)(1), unless the  authorization is terminated or revoked.  Performed at [facility], [address] [place], [place] [address]     Coagulation Studies: No results for input(s): LABPROT, INR in the last 72 hours.  Urinalysis: No results for input(s): COLORURINE, LABSPEC, PHURINE, GLUCOSEU, HGBUR, BILIRUBINUR, KETONESUR, PROTEINUR, UROBILINOGEN, NITRITE, LEUKOCYTESUR in the last 72 hours.  Invalid input(s): APPERANCEUR    Imaging: No results found.   Medications:     amLODipine   5 mg Oral BID   apixaban   5 mg Oral BID   budesonide -glycopyrrolate -formoterol   2 puff Inhalation BID   carvedilol   3.125 mg Oral BID WC   cyanocobalamin   1,000 mcg Oral Daily   ferrous sulfate   325 mg Oral QPM   insulin  aspart  0-5 Units Subcutaneous QHS   insulin  aspart  0-9 Units Subcutaneous TID WC   insulin  glargine-yfgn  21 Units Subcutaneous Daily   magic mouthwash w/lidocaine   5 mL Oral TID   pantoprazole   40 mg Oral Daily   rosuvastatin   10 mg Oral QHS   sodium bicarbonate   650 mg Oral TID   acetaminophen , albuterol , dextromethorphan -guaiFENesin , methocarbamol , ondansetron  (ZOFRAN ) IV, simethicone   Assessment/ Plan:  Mr. Purcell Jungbluth Shiflett is a 68 y.o.  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  Patient appears to have recurrent acute kidney injury.  Has been on and off diuretics.  Baseline creatinine appears to be 3.25 with GFR 20 on 10/17/23.  History of solitary kidney. 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 - Creatinine slightly higher today at 3.26.  No indication for dialysis at the moment.  Continue to monitor renal function while still here.  2.  Hypertension Maintain amlodipine  and carvedilol  for hypertension  control.  Blood pressure 159/90 this a.m.   3. Anemia of chronic kidney disease Patient received blood transfusion this admission.  11/11/2023, 11/15/2023. Hemoglobin to 9.5 at the moment. Lab Results  Component Value Date   HGB 9.5 (L) 12/04/2023     4. Diabetes mellitus type II with chronic kidney disease/renal manifestations: insulin  dependent. Home regimen includes Lantus  and humalog . Most recent hemoglobin A1c is 6.7 on 09/18/23.  Glucose management as per primary team.                 LOS: 0 Deagan Sevin 6/23/20259:20 AM

## 2023-12-04 NOTE — Progress Notes (Signed)
 Progress Note   Patient: Larry Callahan FMW:[id number] DOB: 1956-01-29 DOA: 11/22/2023     0 DOS: the patient was seen and examined on 12/04/2023   Brief hospital course:  68 year old male with multiple medical problems including: heart failure with reduced EF 40% secondary to nonischemic cardiomyopathy, a flutter status post ablation on Eliquis , CKD stage IV, hypertension, type 2 diabetes, COPD, OSA, HLD, nonobstructive CAD, CVA.  Patient was recently admitted to the hospital and discharged on 6/2.  During that admission he was seen for COPD exacerbation and AKI superimposed on CKD.  He was discharged home in the custody of his nephew with whom he resides.  He then presented to the ED again on 6/4 requesting SNF placement.  He remained in the ED for 4 days pending SNF arrangements.  Ultimately SNF was found to be cost prohibitive and the patient discharged back home to his nephew on 6/8.  He presents again to the ED on this admission requesting long-term placement.  He reports he is no longer safe at home with his nephew but can also not afford skilled nursing facility placement.  ED physician had concerns of tachypnea with expiratory wheeze and requested admission for COPD exacerbation.  Patient's respiratory status appeared to be at baseline and he is medically cleared for discharge.  Stay was then prolonged pending placement.  Insurance company rejected rehab on 6/18 so discharge was canceled.  Looking into other options.  6/23.  Called to see patient secondary to left facial swelling.  Patient with poor dentition.  Will start Solu-Medrol , Rocephin  and Flagyl.    Assessment and Plan: * Left facial swelling The patient does have poor dentition.  Start IV Decadron , IV Rocephin  and IV Flagyl.  Housing situation unstable Rehab was rejected by [employer].  TOC looking into other options.  Tongue lesion Likely aphthous ulcer  Chronic systolic CHF (congestive heart failure)  (HCC) Patient euvolemic.  Holding off on diuretics secondary to kidney function.  Patient not short of breath and breathing comfortably on room air.  Continue Coreg .  Last EF shows a improved ejection fraction of 50%.  Acute kidney injury superimposed on CKD (HCC) AKI on CKD stage IV.  Baseline creatinine around 3.5.  Patient creatinine was up at 4.7.  Holding diuretic.  Last creatinine 3.26 with a BUN of 85.  With a GFR of 20.    Type II diabetes mellitus with renal manifestations (HCC) And hyperglycemia and hypoglycemia.  Decadron  started today and should increase sugars.  With cheek swelling decreased appetite.  Will decrease Semglee  insulin  down to 18 units.  Continue sliding scale.  Atrial flutter (HCC) Status post ablation.  Continue Eliquis  for anticoagulation.  On lower dose Coreg .  Essential hypertension Continue Coreg , Norvasc   COPD exacerbation (HCC) Resolved.  Completed 5-day course of prednisone .  On room air.  HLD (hyperlipidemia) Continue Crestor   Iron  deficiency anemia Last hemoglobin 8.4  Hx of completed stroke Continue Eliquis         Subjective: Patient have difficulty chewing secondary to left facial swelling.  Happened all of a sudden.  No new medications.  Physical Exam: Vitals:   12/03/23 2047 12/04/23 0519 12/04/23 0818 12/04/23 1500  BP: (!) 149/65 (!) 158/75 (!) 159/90 (!) 144/77  Pulse: 75 82 (!) 106 82  Resp: 20 20 17 16   Temp: 98.4 F (36.9 C) 98.4 F (36.9 C) 97.6 F (36.4 C) (!) 97.5 F (36.4 C)  TempSrc:    Oral  SpO2: 100% 99% 98% 100%  Weight:  88.7 kg    Height:       Physical Exam HENT:     Head: Normocephalic.     Mouth/Throat:     Comments: Swelling left cheek.  Eyes:     General: Lids are normal.     Conjunctiva/sclera: Conjunctivae normal.    Cardiovascular:     Rate and Rhythm: Normal rate and regular rhythm.     Heart sounds: Normal heart sounds, S1 normal and S2 normal.  Pulmonary:     Breath sounds: No  decreased breath sounds, wheezing, rhonchi or rales.  Abdominal:     Palpations: Abdomen is soft.     Tenderness: There is no abdominal tenderness.   Musculoskeletal:     Right lower leg: No swelling.     Left lower leg: No swelling.   Skin:    General: Skin is warm.     Findings: No rash.     Comments: Swelling left cheek   Neurological:     Mental Status: He is alert.     Data Reviewed: Creatinine 3.26, BUN 85, GFR 20, hemoglobin 9.5  Disposition: Status is: Observation Today patient with left facial swelling acute, painful to touch.  Start IV Decadron , IV Rocephin  and IV Flagyl.  Planned Discharge Destination: Will need placement    Time spent: 28 minutes  Author: Charlie Patterson, MD 12/04/2023 4:30 PM  For on call review [email or web address].

## 2023-12-04 NOTE — Assessment & Plan Note (Addendum)
 The patient does have poor dentition.  Suspect dental abscess with facial swelling.  Completed antibiotics and steroid course.

## 2023-12-04 NOTE — Progress Notes (Signed)
 Pt's left cheek is noticeably swollen. Pain level 3 upon assessment of area. SN notified Dr. Josette of Pt's status. Dr. examined Pt. Will plan to start ABX/Anti-inflammatory med course. Will continue to monitor.

## 2023-12-05 DIAGNOSIS — R220 Localized swelling, mass and lump, head: Secondary | ICD-10-CM | POA: Diagnosis not present

## 2023-12-05 DIAGNOSIS — K148 Other diseases of tongue: Secondary | ICD-10-CM | POA: Diagnosis not present

## 2023-12-05 DIAGNOSIS — I5022 Chronic systolic (congestive) heart failure: Secondary | ICD-10-CM | POA: Diagnosis not present

## 2023-12-05 DIAGNOSIS — Z59819 Housing instability, housed unspecified: Secondary | ICD-10-CM | POA: Diagnosis not present

## 2023-12-05 LAB — GLUCOSE, CAPILLARY
Glucose-Capillary: 312 mg/dL — ABNORMAL HIGH (ref 70–99)
Glucose-Capillary: 283 mg/dL — ABNORMAL HIGH (ref 70–99)
Glucose-Capillary: 278 mg/dL — ABNORMAL HIGH (ref 70–99)
Glucose-Capillary: 259 mg/dL — ABNORMAL HIGH (ref 70–99)

## 2023-12-05 MED ORDER — INSULIN ASPART 100 UNIT/ML IJ SOLN
2.0000 [IU] | Freq: Three times a day (TID) | INTRAMUSCULAR | Status: DC
  Administered 2023-12-05: 2 [IU] via SUBCUTANEOUS
  Filled 2023-12-05: qty 1

## 2023-12-05 MED ORDER — INSULIN GLARGINE-YFGN 100 UNIT/ML ~~LOC~~ SOLN
24.0000 [IU] | Freq: Every day | SUBCUTANEOUS | Status: DC
  Administered 2023-12-05 – 2023-12-08 (×4): 24 [IU] via SUBCUTANEOUS
  Filled 2023-12-05 (×4): qty 0.24

## 2023-12-05 MED ORDER — INSULIN ASPART 100 UNIT/ML IJ SOLN
3.0000 [IU] | Freq: Three times a day (TID) | INTRAMUSCULAR | Status: DC
  Administered 2023-12-05 – 2023-12-08 (×8): 3 [IU] via SUBCUTANEOUS
  Filled 2023-12-05 (×8): qty 1

## 2023-12-05 NOTE — Progress Notes (Signed)
 [facility]  ROUNDING NOTE   Subjective:  Larry Callahan is a 68 y.o. male with past medical conditions including CHF, COPD, CAD, stroke, diabetes, CKDIV and hypertension, who was admitted to [facility] on 11/04/2023 for COPD exacerbation (HCC) [J44.1]   Update: Patient with left-sided facial swelling. Started on Decadron , Rocephin , and Flagyl. No new renal function testing today. 06/23 0701 - 06/24 0700 In: 463.7 [P.O.:240; IV Piggyback:223.7] Out: 800 [Urine:800]    Objective:  Vital signs in last 24 hours:  Temp:  [97.4 F (36.3 C)-97.7 F (36.5 C)] 97.7 F (36.5 C) (06/24 0733) Pulse Rate:  [71-86] 71 (06/24 0733) Resp:  [16-20] 20 (06/24 0733) BP: (129-156)/(75-79) 152/77 (06/24 0733) SpO2:  [98 %-100 %] 98 % (06/24 0733) Weight:  [89.1 kg] 89.1 kg (06/24 0500)  Weight change: 0.4 kg Filed Weights   12/03/23 0500 12/04/23 0519 12/05/23 0500  Weight: 89.3 kg 88.7 kg 89.1 kg    Intake/Output: I/O last 3 completed shifts: In: 463.7 [P.O.:240; IV Piggyback:223.7] Out: 1350 [Urine:1350]   Intake/Output this shift:  No intake/output data recorded.  Physical Exam: General: NAD  Head: Left-sided facial swelling. Moist oral mucosal membranes  Eyes: Anicteric  Neck: Supple  Lungs:  Clear bilateral  Heart: Regular rate and rhythm  Abdomen:  Soft, nontender, BS present  Extremities: Trace peripheral edema.  Neurologic: Awake, alert, conversant  Skin: No lesions  Access: None    Basic Metabolic Panel: Recent Labs  Lab 12/01/23 0618 12/04/23 0430  NA 138 139  K 4.8 4.7  CL 111 112*  CO2 22 22  GLUCOSE 118* 82  BUN 103* 85*  CREATININE 3.18* 3.26*  CALCIUM  7.7* 7.9*    Liver Function Tests: No results for input(s): AST, ALT, ALKPHOS, BILITOT, PROT, ALBUMIN  in the last 168 hours.  No results for input(s): LIPASE, AMYLASE in the last 168 hours. No results for input(s): AMMONIA in the last 168 hours.  CBC: Recent  Labs  Lab 12/01/23 0618 12/04/23 0430  WBC 6.6  --   HGB 8.4* 9.5*  HCT 25.8*  --   MCV 89.0  --   PLT 150  --     Cardiac Enzymes: No results for input(s): CKTOTAL, CKMB, CKMBINDEX, TROPONINI in the last 168 hours.  BNP: Invalid input(s): POCBNP  CBG: Recent Labs  Lab 12/04/23 1145 12/04/23 1229 12/04/23 1614 12/04/23 2023 12/05/23 0734  GLUCAP 55* 88 129* 263* 312*    Microbiology: Results for orders placed or performed during the hospital encounter of 11/22/23  Resp panel by RT-PCR (RSV, Flu A&B, Covid) Anterior Nasal Swab     Status: None   Collection Time: 11/22/23  8:26 PM   Specimen: Anterior Nasal Swab  Result Value Ref Range Status   SARS Coronavirus 2 by RT PCR NEGATIVE NEGATIVE Final    Comment: (NOTE) SARS-CoV-2 target nucleic acids are NOT DETECTED.  The SARS-CoV-2 RNA is generally detectable in upper respiratory specimens during the acute phase of infection. The lowest concentration of SARS-CoV-2 viral copies this assay can detect is 138 copies/mL. A negative result does not preclude SARS-Cov-2 infection and should not be used as the sole basis for treatment or other patient management decisions. A negative result may occur with  improper specimen collection/handling, submission of specimen other than nasopharyngeal swab, presence of viral mutation(s) within the areas targeted by this assay, and inadequate number of viral copies(<138 copies/mL). A negative result must be combined with clinical observations, patient history, and epidemiological information. The  expected result is Negative.  Fact Sheet for Patients:  BloggerCourse.com  Fact Sheet for Healthcare Providers:  SeriousBroker.it  This test is no t yet approved or cleared by the [place]  FDA and  has been authorized for detection and/or diagnosis of SARS-CoV-2 by FDA under an Emergency Use Authorization (EUA). This EUA will  remain  in effect (meaning this test can be used) for the duration of the COVID-19 declaration under Section 564(b)(1) of the Act, 21 U.S.C.section 360bbb-3(b)(1), unless the authorization is terminated  or revoked sooner.       Influenza A by PCR NEGATIVE NEGATIVE Final   Influenza B by PCR NEGATIVE NEGATIVE Final    Comment: (NOTE) The Xpert Xpress SARS-CoV-2/FLU/RSV plus assay is intended as an aid in the diagnosis of influenza from Nasopharyngeal swab specimens and should not be used as a sole basis for treatment. Nasal washings and aspirates are unacceptable for Xpert Xpress SARS-CoV-2/FLU/RSV testing.  Fact Sheet for Patients: BloggerCourse.com  Fact Sheet for Healthcare Providers: SeriousBroker.it  This test is not yet approved or cleared by the [place]  FDA and has been authorized for detection and/or diagnosis of SARS-CoV-2 by FDA under an Emergency Use Authorization (EUA). This EUA will remain in effect (meaning this test can be used) for the duration of the COVID-19 declaration under Section 564(b)(1) of the Act, 21 U.S.C. section 360bbb-3(b)(1), unless the authorization is terminated or revoked.     Resp Syncytial Virus by PCR NEGATIVE NEGATIVE Final    Comment: (NOTE) Fact Sheet for Patients: BloggerCourse.com  Fact Sheet for Healthcare Providers: SeriousBroker.it  This test is not yet approved or cleared by the [place]  FDA and has been authorized for detection and/or diagnosis of SARS-CoV-2 by FDA under an Emergency Use Authorization (EUA). This EUA will remain in effect (meaning this test can be used) for the duration of the COVID-19 declaration under Section 564(b)(1) of the Act, 21 U.S.C. section 360bbb-3(b)(1), unless the authorization is terminated or revoked.  Performed at [facility], [address] [place], [place] [address]      Coagulation Studies: No results for input(s): LABPROT, INR in the last 72 hours.  Urinalysis: No results for input(s): COLORURINE, LABSPEC, PHURINE, GLUCOSEU, HGBUR, BILIRUBINUR, KETONESUR, PROTEINUR, UROBILINOGEN, NITRITE, LEUKOCYTESUR in the last 72 hours.  Invalid input(s): APPERANCEUR    Imaging: No results found.   Medications:    cefTRIAXone  (ROCEPHIN )  IV Stopped (12/04/23 1218)   metronidazole 500 mg (12/04/23 2229)    amLODipine   5 mg Oral BID   apixaban   5 mg Oral BID   budesonide -glycopyrrolate -formoterol   2 puff Inhalation BID   carvedilol   3.125 mg Oral BID WC   cyanocobalamin   1,000 mcg Oral Daily   dexamethasone  (DECADRON ) injection  10 mg Intravenous Q12H   ferrous sulfate   325 mg Oral QPM   insulin  aspart  0-5 Units Subcutaneous QHS   insulin  aspart  0-9 Units Subcutaneous TID WC   insulin  aspart  2 Units Subcutaneous TID WC   insulin  glargine-yfgn  24 Units Subcutaneous Daily   pantoprazole   40 mg Oral Daily   rosuvastatin   10 mg Oral QHS   sodium bicarbonate   650 mg Oral TID   acetaminophen , albuterol , dextromethorphan -guaiFENesin , menthol-cetylpyridinium, methocarbamol , ondansetron  (ZOFRAN ) IV, simethicone   Assessment/ Plan:  Larry Callahan is a 68 y.o.  male with past medical conditions including CHF, COPD, CAD, stroke, diabetes, CKDIV and hypertension, who was admitted to [facility] on 11/04/2023 for COPD exacerbation (HCC) [email or web address]  Acute kidney injury on chronic kidney disease IV.  Patient appears to have recurrent acute kidney injury.  Has been on and off diuretics.  Baseline creatinine appears to be 3.25 with GFR 20 on 10/17/23.  History of solitary kidney. 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 - No new renal function testing today.  Creatinine yesterday was 3.2.  No indication for dialysis.  Continue to.  Can monitor renal function..  2.   Hypertension Maintain amlodipine  and carvedilol  for hypertension control.  Blood pressure currently 152/77.   3. Anemia of chronic kidney disease Patient received blood transfusion this admission.  11/11/2023, 11/15/2023. Hemoglobin to 9.5 at the moment.  Consider Epogen as outpatient. Lab Results  Component Value Date   HGB 9.5 (L) 12/04/2023     4. Diabetes mellitus type II with chronic kidney disease/renal manifestations: insulin  dependent. Home regimen includes Lantus  and humalog . Most recent hemoglobin A1c is 6.7 on 09/18/23.  Glucose management as per primary team.                 LOS: 0 Larry Callahan 6/24/20258:27 AM
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
## 2023-12-05 NOTE — Progress Notes (Signed)
 PT Cancellation Note  Patient Details Name: Larry Callahan MRN: [id number] DOB: 11-19-1955   Cancelled Treatment:    Reason Eval/Treat Not Completed: Patient declined, no reason specified (Patient declined PT, stating he does not feel up for walking. Will continue with attempts.)  Randine Essex, PT, MPT  Randine LULLA Essex 12/05/2023, 2:08 PM

## 2023-12-05 NOTE — Inpatient Diabetes Management (Addendum)
 Inpatient Diabetes Program Recommendations  AACE/ADA: New Consensus Statement on Inpatient Glycemic Control (2015)  Target Ranges:  Prepandial:   less than 140 mg/dL      Peak postprandial:   less than 180 mg/dL (1-2 hours)      Critically ill patients:  140 - 180 mg/dL    Latest Reference Range & Units 12/04/23 08:15 12/04/23 11:45 12/04/23 12:29 12/04/23 16:14 12/04/23 20:23  Glucose-Capillary 70 - 99 mg/dL 66 (L)   21 units Semglee  @0934  55 (L) 88 129 (H)  1 unit Novolog   263 (H)  3 units Novolog      Latest Reference Range & Units 12/05/23 07:34  Glucose-Capillary 70 - 99 mg/dL 687 (H)  (H): Data is abnormally high      Home DM Meds: Toujeo  10 units at HS       Humalog  0-10 units TID  Current Orders: Semglee  18 units daily      Novolog  Sensitive Correction Scale/ SSI (0-9 units) TID AC + HS     Note Hypoglycemia yest at 8am and again at 11am Semglee  reduced to 21 units daily yest Semglee  reduced to 18 units daily this AM  Note Decadron  started yest AM--CBG 312 this AM  If CBGs remain elevated due to Steroids, may consider increasing the frequency and strength of the Novolog  SSi to the 0-15 unit scale Q4 hours     --Will follow patient during hospitalization--  Adina Rudolpho Arrow RN, MSN, CDCES Diabetes Coordinator Inpatient Glycemic Control Team Team Pager: [phone] (8a-5p)

## 2023-12-06 ENCOUNTER — Observation Stay

## 2023-12-06 DIAGNOSIS — R220 Localized swelling, mass and lump, head: Secondary | ICD-10-CM

## 2023-12-06 LAB — CBC WITH DIFFERENTIAL/PLATELET
Abs Immature Granulocytes: 0.09 10*3/uL — ABNORMAL HIGH (ref 0.00–0.07)
Basophils Absolute: 0 10*3/uL (ref 0.0–0.1)
Basophils Relative: 0 %
Eosinophils Absolute: 0 10*3/uL (ref 0.0–0.5)
Eosinophils Relative: 0 %
HCT: 31.6 % — ABNORMAL LOW (ref 39.0–52.0)
Hemoglobin: 10.2 g/dL — ABNORMAL LOW (ref 13.0–17.0)
Immature Granulocytes: 1 %
Lymphocytes Relative: 5 %
Lymphs Abs: 0.7 10*3/uL (ref 0.7–4.0)
MCH: 28.1 pg (ref 26.0–34.0)
MCHC: 32.3 g/dL (ref 30.0–36.0)
MCV: 87.1 fL (ref 80.0–100.0)
Monocytes Absolute: 0.2 10*3/uL (ref 0.1–1.0)
Monocytes Relative: 1 %
Neutro Abs: 12.1 10*3/uL — ABNORMAL HIGH (ref 1.7–7.7)
Neutrophils Relative %: 93 %
Platelets: 246 10*3/uL (ref 150–400)
RBC: 3.63 MIL/uL — ABNORMAL LOW (ref 4.22–5.81)
RDW: 13.2 % (ref 11.5–15.5)
WBC: 13.4 10*3/uL — ABNORMAL HIGH (ref 4.0–10.5)
nRBC: 0 % (ref 0.0–0.2)

## 2023-12-06 LAB — BASIC METABOLIC PANEL WITH GFR
Anion gap: 12 (ref 5–15)
BUN: 89 mg/dL — ABNORMAL HIGH (ref 8–23)
CO2: 18 mmol/L — ABNORMAL LOW (ref 22–32)
Calcium: 8.4 mg/dL — ABNORMAL LOW (ref 8.9–10.3)
Chloride: 107 mmol/L (ref 98–111)
Creatinine, Ser: 3.75 mg/dL — ABNORMAL HIGH (ref 0.61–1.24)
GFR, Estimated: 17 mL/min — ABNORMAL LOW (ref >60)
Glucose, Bld: 279 mg/dL — ABNORMAL HIGH (ref 70–99)
Potassium: 4.7 mmol/L (ref 3.5–5.1)
Sodium: 137 mmol/L (ref 135–145)

## 2023-12-06 LAB — GLUCOSE, CAPILLARY
Glucose-Capillary: 205 mg/dL — ABNORMAL HIGH (ref 70–99)
Glucose-Capillary: 342 mg/dL — ABNORMAL HIGH (ref 70–99)
Glucose-Capillary: 288 mg/dL — ABNORMAL HIGH (ref 70–99)
Glucose-Capillary: 228 mg/dL — ABNORMAL HIGH (ref 70–99)

## 2023-12-06 LAB — PROCALCITONIN: Procalcitonin: <0.1 ng/mL

## 2023-12-06 MED ORDER — DEXAMETHASONE SODIUM PHOSPHATE 4 MG/ML IJ SOLN
4.0000 mg | Freq: Two times a day (BID) | INTRAMUSCULAR | Status: DC
  Administered 2023-12-06: 4 mg via INTRAVENOUS
  Filled 2023-12-06: qty 1

## 2023-12-06 NOTE — Progress Notes (Signed)
 [facility]  ROUNDING NOTE   Subjective:  [name] is a 68 y.o. male with past medical conditions including CHF, COPD, CAD, stroke, diabetes, CKDIV and hypertension, who was admitted to [facility] on 11/04/2023 for COPD exacerbation [facility]) [J44.1]   Update: Patient seen sitting in chair Drinking coffee, appetite appropriate  Room air with no lower extremity edema   06/24 0701 - 06/25 0700 In: 1061.3 [P.O.:740; IV Piggyback:321.3] Out: 1175 [Urine:1175]    Objective:  Vital signs in last 24 hours:  Temp:  [97.1 F (36.2 C)-98.1 F (36.7 C)] 98.1 F (36.7 C) (06/25 0926) Pulse Rate:  [67-73] 68 (06/25 0926) Resp:  [18] 18 (06/25 0926) BP: (136-146)/(67-74) 139/67 (06/25 0926) SpO2:  [98 %-100 %] 100 % (06/25 0926) Weight:  [90.4 kg] 90.4 kg (06/25 0500)  Weight change: 1.3 kg Filed Weights   12/04/23 0519 12/05/23 0500 12/06/23 0500  Weight: 88.7 kg 89.1 kg 90.4 kg    Intake/Output: I/O last 3 completed shifts: In: 1061.3 [P.O.:740; IV Piggyback:321.3] Out: 1675 [Urine:1675]   Intake/Output this shift:  Total I/O In: 363.2 [P.O.:240; IV Piggyback:123.2] Out: 350 [Urine:350]  Physical Exam: General: NAD  Head: Left-sided facial swelling. Moist oral mucosal membranes  Eyes: Anicteric  Neck: Supple  Lungs:  Clear bilaterally  Heart: Regular rate and rhythm  Abdomen:  Soft, nontender, BS present  Extremities: No peripheral edema.  Neurologic: Awake, alert, conversant  Skin: No lesions  Access: None    Basic Metabolic Panel: Recent Labs  Lab 12/01/23 0618 12/04/23 0430 12/06/23 0935  NA 138 139 137  K 4.8 4.7 4.7  CL 111 112* 107  CO2 22 22 18*  GLUCOSE 118* 82 279*  BUN 103* 85* 89*  CREATININE 3.18* 3.26* 3.75*  CALCIUM  7.7* 7.9* 8.4*    Liver Function Tests: No results for input(s): AST, ALT, ALKPHOS, BILITOT, PROT, ALBUMIN  in the last 168 hours.  No results for input(s): LIPASE, AMYLASE in the last 168  hours. No results for input(s): AMMONIA in the last 168 hours.  CBC: Recent Labs  Lab 12/01/23 0618 12/04/23 0430 12/06/23 0935  WBC 6.6  --  13.4*  NEUTROABS  --   --  12.1*  HGB 8.4* 9.5* 10.2*  HCT 25.8*  --  31.6*  MCV 89.0  --  87.1  PLT 150  --  246    Cardiac Enzymes: No results for input(s): CKTOTAL, CKMB, CKMBINDEX, TROPONINI in the last 168 hours.  BNP: Invalid input(s): POCBNP  CBG: Recent Labs  Lab 12/05/23 1146 12/05/23 1702 12/05/23 2025 12/06/23 0804 12/06/23 1215  GLUCAP 283* 278* 259* 205* 342*    Microbiology: Results for orders placed or performed during the hospital encounter of 11/22/23  Resp panel by RT-PCR (RSV, Flu A&B, Covid) Anterior Nasal Swab     Status: None   Collection Time: 11/22/23  8:26 PM   Specimen: Anterior Nasal Swab  Result Value Ref Range Status   SARS Coronavirus 2 by RT PCR NEGATIVE NEGATIVE Final    Comment: (NOTE) SARS-CoV-2 target nucleic acids are NOT DETECTED.  The SARS-CoV-2 RNA is generally detectable in upper respiratory specimens during the acute phase of infection. The lowest concentration of SARS-CoV-2 viral copies this assay can detect is 138 copies/mL. A negative result does not preclude SARS-Cov-2 infection and should not be used as the sole basis for treatment or other patient management decisions. A negative result may occur with  improper specimen collection/handling, submission of specimen other than nasopharyngeal  swab, presence of viral mutation(s) within the areas targeted by this assay, and inadequate number of viral copies(<138 copies/mL). A negative result must be combined with clinical observations, patient history, and epidemiological information. The expected result is Negative.  Fact Sheet for Patients:  BloggerCourse.com  Fact Sheet for Healthcare Providers:  SeriousBroker.it  This test is no t yet approved or cleared by the  [place]  FDA and  has been authorized for detection and/or diagnosis of SARS-CoV-2 by FDA under an Emergency Use Authorization (EUA). This EUA will remain  in effect (meaning this test can be used) for the duration of the COVID-19 declaration under Section 564(b)(1) of the Act, 21 U.S.C.section 360bbb-3(b)(1), unless the authorization is terminated  or revoked sooner.       Influenza A by PCR NEGATIVE NEGATIVE Final   Influenza B by PCR NEGATIVE NEGATIVE Final    Comment: (NOTE) The Xpert Xpress SARS-CoV-2/FLU/RSV plus assay is intended as an aid in the diagnosis of influenza from Nasopharyngeal swab specimens and should not be used as a sole basis for treatment. Nasal washings and aspirates are unacceptable for Xpert Xpress SARS-CoV-2/FLU/RSV testing.  Fact Sheet for Patients: BloggerCourse.com  Fact Sheet for Healthcare Providers: SeriousBroker.it  This test is not yet approved or cleared by the [place]  FDA and has been authorized for detection and/or diagnosis of SARS-CoV-2 by FDA under an Emergency Use Authorization (EUA). This EUA will remain in effect (meaning this test can be used) for the duration of the COVID-19 declaration under Section 564(b)(1) of the Act, 21 U.S.C. section 360bbb-3(b)(1), unless the authorization is terminated or revoked.     Resp Syncytial Virus by PCR NEGATIVE NEGATIVE Final    Comment: (NOTE) Fact Sheet for Patients: BloggerCourse.com  Fact Sheet for Healthcare Providers: SeriousBroker.it  This test is not yet approved or cleared by the [place]  FDA and has been authorized for detection and/or diagnosis of SARS-CoV-2 by FDA under an Emergency Use Authorization (EUA). This EUA will remain in effect (meaning this test can be used) for the duration of the COVID-19 declaration under Section 564(b)(1) of the Act, 21 U.S.C. section  360bbb-3(b)(1), unless the authorization is terminated or revoked.  Performed at [facility], [address] [place], [place] [address]     Coagulation Studies: No results for input(s): LABPROT, INR in the last 72 hours.  Urinalysis: No results for input(s): COLORURINE, LABSPEC, PHURINE, GLUCOSEU, HGBUR, BILIRUBINUR, KETONESUR, PROTEINUR, UROBILINOGEN, NITRITE, LEUKOCYTESUR in the last 72 hours.  Invalid input(s): APPERANCEUR    Imaging: No results found.   Medications:    cefTRIAXone  (ROCEPHIN )  IV Stopped (12/06/23 1056)   metronidazole Stopped (12/06/23 1220)    amLODipine   5 mg Oral BID   apixaban   5 mg Oral BID   budesonide -glycopyrrolate -formoterol   2 puff Inhalation BID   carvedilol   3.125 mg Oral BID WC   cyanocobalamin   1,000 mcg Oral Daily   dexamethasone  (DECADRON ) injection  4 mg Intravenous Q12H   ferrous sulfate   325 mg Oral QPM   insulin  aspart  0-5 Units Subcutaneous QHS   insulin  aspart  0-9 Units Subcutaneous TID WC   insulin  aspart  3 Units Subcutaneous TID WC   insulin  glargine-yfgn  24 Units Subcutaneous Daily   pantoprazole   40 mg Oral Daily   rosuvastatin   10 mg Oral QHS   sodium bicarbonate   650 mg Oral TID   acetaminophen , albuterol , dextromethorphan -guaiFENesin , menthol-cetylpyridinium, methocarbamol , ondansetron  (ZOFRAN ) IV, simethicone   Assessment/ Plan:  [name] is  a 68 y.o.  male with past medical conditions including CHF, COPD, CAD, stroke, diabetes, CKDIV and hypertension, who was admitted to [facility] on 11/04/2023 for COPD exacerbation (HCC) [J44.1]     Acute kidney injury on chronic kidney disease IV.  Patient appears to have recurrent acute kidney injury.  Has been on and off diuretics.  Baseline creatinine appears to be 3.25 with GFR 20 on 10/17/23.  History of solitary kidney. 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 - Creatinine  remains stable, 3.75. Adequate urine output.  - Will schedule follow up in our office at discharge.   2.  Hypertension Maintain amlodipine  and carvedilol  for hypertension control.  Blood pressure well controlled   3. Anemia of chronic kidney disease Patient received blood transfusion this admission.  11/11/2023, 11/15/2023.  Lab Results  Component Value Date   HGB 10.2 (L) 12/06/2023   Hgb acceptable at current level. Will continue to monitor.    4. Diabetes mellitus type II with chronic kidney disease/renal manifestations: insulin  dependent. Home regimen includes Lantus  and humalog . Most recent hemoglobin A1c is 6.7 on 09/18/23.  Glucose elevated at times. Primary team to continue management of SSI               Due to renal stability, we will sign off at this time.    LOS: 0 Kaylin Schellenberg 6/25/20252:06 PM

## 2023-12-06 NOTE — Progress Notes (Signed)
 The patient was offered to help him take a bath at bedtime. The patient became irritable and told this writer and the NT no.

## 2023-12-06 NOTE — Plan of Care (Signed)
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
## 2023-12-06 NOTE — Progress Notes (Signed)
 PROGRESS NOTE    Larry Callahan  FMW:[id number] DOB: 1956/03/10 DOA: 11/22/2023 PCP: Liana Fish, NP    Brief Narrative:    68 year old male with multiple medical problems including: heart failure with reduced EF 40% secondary to nonischemic cardiomyopathy, a flutter status post ablation on Eliquis , CKD stage IV, hypertension, type 2 diabetes, COPD, OSA, HLD, nonobstructive CAD, CVA.  Patient was recently admitted to the hospital and discharged on 6/2.  During that admission he was seen for COPD exacerbation and AKI superimposed on CKD.  He was discharged home in the custody of his nephew with whom he resides.  He then presented to the ED again on 6/4 requesting SNF placement.  He remained in the ED for 4 days pending SNF arrangements.  Ultimately SNF was found to be cost prohibitive and the patient discharged back home to his nephew on 6/8.  He presents again to the ED on this admission requesting long-term placement.  He reports he is no longer safe at home with his nephew but can also not afford skilled nursing facility placement.  ED physician had concerns of tachypnea with expiratory wheeze and requested admission for COPD exacerbation.  Patient's respiratory status appeared to be at baseline and he is medically cleared for discharge.  Stay was then prolonged pending placement.  Insurance company rejected rehab on 6/18 so discharge was canceled.  Looking into other options.   6/23.  Called to see patient secondary to left facial swelling.  Patient with poor dentition.  Will start Solu-Medrol , Rocephin  and Flagyl.     Assessment & Plan:   Principal Problem:   Left facial swelling Active Problems:   Housing situation unstable   Tongue lesion   Chronic systolic CHF (congestive heart failure) (HCC)   Acute kidney injury superimposed on CKD (HCC)   Essential hypertension   Atrial flutter (HCC)   Type II diabetes mellitus with renal manifestations (HCC)   HLD  (hyperlipidemia)   COPD exacerbation (HCC)   CAD (coronary artery disease)   Metabolic acidosis, normal anion gap (NAG)   Iron  deficiency anemia   Hx of completed stroke   Left facial swelling Suspect odontogenic infection.  Facial swelling is improving. Plan: Check CT maxillofacial without contrast Continue Rocephin  and Flagyl for now Continue Decadron , wean dose Monitor vitals and fever curve  Housing situation unstable Rehab was rejected by insurance company.   TOC contacted for alternative options   Tongue lesion Likely aphthous ulcer Continue to monitor   Chronic systolic CHF (congestive heart failure) (HCC) Patient euvolemic.  No diuresis secondary to kidney function.  On room air.  Last EF 50%.  Continue Coreg .     Acute kidney injury superimposed on CKD (HCC) AKI on CKD stage IV.  Baseline creatinine around 3.5.  Up to 4.7.  Diuretic on hold.  Nephrology following.  No plans for dialysis at this time.   Type II diabetes mellitus with renal manifestations (HCC) Titrate insulin  regimen with steroids on board   Atrial flutter (HCC) Status post ablation.  On Eliquis , will continue.  On Coreg .   Essential hypertension Continue Coreg , Norvasc    COPD exacerbation (HCC) Resolved.  Completed 5-day course of prednisone .  On room air. Oxygen  saturation with vital signs checks   HLD (hyperlipidemia) Continue Crestor    Iron  deficiency anemia Last hemoglobin 8.4 No indication for transfusion   Hx of completed stroke Continue Eliquis     DVT prophylaxis: Eliquis  Code Status: Full Family Communication: None Disposition Plan: Status is: Observation The  patient will require care spanning > 2 midnights and should be moved to inpatient because: Odontogenic infection on IV abx   Level of care: Telemetry Medical  Consultants:  None  Procedures:  None  Antimicrobials: Rocephin  Flagyl    Subjective: Seen and examined.  Sitting up in chair.  No visible  distress.  Reports some stinging and burning on left side of the face.  Objective: Vitals:   12/05/23 2024 12/06/23 0418 12/06/23 0500 12/06/23 0926  BP: 136/70 (!) 146/74  139/67  Pulse: 70 67  68  Resp:    18  Temp: (!) 97.5 F (36.4 C) (!) 97.1 F (36.2 C)  98.1 F (36.7 C)  TempSrc:      SpO2: 99% 98%  100%  Weight:   90.4 kg   Height:        Intake/Output Summary (Last 24 hours) at 12/06/2023 1202 Last data filed at 12/06/2023 1104 Gross per 24 hour  Intake 1084.43 ml  Output 1125 ml  Net -40.57 ml   Filed Weights   12/04/23 0519 12/05/23 0500 12/06/23 0500  Weight: 88.7 kg 89.1 kg 90.4 kg    Examination:  General exam: NAD.  Appears chronically ill Respiratory system: Coarse breath sounds bilaterally.  Normal work of breathing.  Room air Cardiovascular system: S1-S2, RRR, no murmurs, no pedal edema Gastrointestinal system: Soft, NT/ND, normal bowel sounds Central nervous system: Alert and oriented. No focal neurological deficits. Extremities: Symmetric 5 x 5 power. Skin: No rashes, lesions or ulcers Psychiatry: Judgement and insight appear normal. Mood & affect appropriate.     Data Reviewed: I have personally reviewed following labs and imaging studies  CBC: Recent Labs  Lab 12/01/23 0618 12/04/23 0430 12/06/23 0935  WBC 6.6  --  13.4*  NEUTROABS  --   --  12.1*  HGB 8.4* 9.5* 10.2*  HCT 25.8*  --  31.6*  MCV 89.0  --  87.1  PLT 150  --  246   Basic Metabolic Panel: Recent Labs  Lab 12/01/23 0618 12/04/23 0430 12/06/23 0935  NA 138 139 137  K 4.8 4.7 4.7  CL 111 112* 107  CO2 22 22 18*  GLUCOSE 118* 82 279*  BUN 103* 85* 89*  CREATININE 3.18* 3.26* 3.75*  CALCIUM  7.7* 7.9* 8.4*   GFR: Estimated Creatinine Clearance: 21.1 mL/min (A) (by C-G formula based on SCr of 3.75 mg/dL (H)). Liver Function Tests: No results for input(s): AST, ALT, ALKPHOS, BILITOT, PROT, ALBUMIN  in the last 168 hours. No results for input(s): LIPASE,  AMYLASE in the last 168 hours. No results for input(s): AMMONIA in the last 168 hours. Coagulation Profile: No results for input(s): INR, PROTIME in the last 168 hours. Cardiac Enzymes: No results for input(s): CKTOTAL, CKMB, CKMBINDEX, TROPONINI in the last 168 hours. BNP (last 3 results) No results for input(s): PROBNP in the last 8760 hours. HbA1C: No results for input(s): HGBA1C in the last 72 hours. CBG: Recent Labs  Lab 12/05/23 0734 12/05/23 1146 12/05/23 1702 12/05/23 2025 12/06/23 0804  GLUCAP 312* 283* 278* 259* 205*   Lipid Profile: No results for input(s): CHOL, HDL, LDLCALC, TRIG, CHOLHDL, LDLDIRECT in the last 72 hours. Thyroid  Function Tests: No results for input(s): TSH, T4TOTAL, FREET4, T3FREE, THYROIDAB in the last 72 hours. Anemia Panel: No results for input(s): VITAMINB12, FOLATE, FERRITIN, TIBC, IRON , RETICCTPCT in the last 72 hours. Sepsis Labs: No results for input(s): PROCALCITON, LATICACIDVEN in the last 168 hours.  No results found for this or  any previous visit (from the past 240 hours).       Radiology Studies: No results found.      Scheduled Meds:  amLODipine   5 mg Oral BID   apixaban   5 mg Oral BID   budesonide -glycopyrrolate -formoterol   2 puff Inhalation BID   carvedilol   3.125 mg Oral BID WC   cyanocobalamin   1,000 mcg Oral Daily   dexamethasone  (DECADRON ) injection  4 mg Intravenous Q12H   ferrous sulfate   325 mg Oral QPM   insulin  aspart  0-5 Units Subcutaneous QHS   insulin  aspart  0-9 Units Subcutaneous TID WC   insulin  aspart  3 Units Subcutaneous TID WC   insulin  glargine-yfgn  24 Units Subcutaneous Daily   pantoprazole   40 mg Oral Daily   rosuvastatin   10 mg Oral QHS   sodium bicarbonate   650 mg Oral TID   Continuous Infusions:  cefTRIAXone  (ROCEPHIN )  IV Stopped (12/06/23 1056)   metronidazole 500 mg (12/06/23 1106)     LOS: 0 days      Larry KATHEE Robson, MD [facility]   If 7PM-7AM, please contact night-coverage  12/06/2023, 12:02 PM

## 2023-12-06 NOTE — Plan of Care (Signed)
 The patient continues on IV Flagyl and Rocephin  for swelling to left jaw w/o s/s of adverse effects noted.

## 2023-12-06 NOTE — Progress Notes (Signed)
 Physical Therapy Treatment Patient Details Name: Larry Callahan MRN: [id number] DOB: 1955-12-11 Today's Date: 12/06/2023   History of Present Illness Larry Callahan is a 68 year old male with history of CHF, a flutter on Eliquis , CKD, T2DM, and CAD who presenting to the ER for housing concerns. MD diagnoses includes COPD, CHF, HTN, atrial flutter, type II diabetes, HLD, and CAD.    PT Comments  Pt received in chair after sitting up for 4 hours, agreeable to PT session. Very chatty this date expressing desire to d/c to a group home. Pt required MinA to stand from low chair and complete gait training with RW twice around nurse's station with CGA for safety. No LOB, cues to stay inside RW, increased time to complete. Continue per POC.   If plan is discharge home, recommend the following: A little help with walking and/or transfers;A little help with bathing/dressing/bathroom;Assistance with cooking/housework;Assist for transportation;Help with stairs or ramp for entrance   Can travel by private vehicle     Yes  Equipment Recommendations  Other (comment) (Pt has a RW and Rollator)    Recommendations for Other Services       Precautions / Restrictions Precautions Precautions: Fall Recall of Precautions/Restrictions: Impaired Restrictions Weight Bearing Restrictions Per Provider Order: No     Mobility  Bed Mobility               General bed mobility comments: Pt in chair pre/post session    Transfers Overall transfer level: Needs assistance Equipment used: Rolling walker (2 wheels) Transfers: Sit to/from Stand Sit to Stand: Min assist           General transfer comment: MinA from low chair after sitting up for 4 hours    Ambulation/Gait Ambulation/Gait assistance: Contact guard assist Gait Distance (Feet): 360 Feet Assistive device: Rolling walker (2 wheels) Gait Pattern/deviations: Step-through pattern Gait velocity: decreased     General Gait  Details: Pt very chaty throughout session, voicing desire to go to a safe home   Stairs             Wheelchair Mobility     Tilt Bed    Modified Rankin (Stroke Patients Only)       Balance Overall balance assessment: History of Falls, Needs assistance Sitting-balance support: Feet supported Sitting balance-Leahy Scale: Normal     Standing balance support: No upper extremity supported Standing balance-Leahy Scale: Good Standing balance comment: Pt appears safer with RW/Rollator while ambulating                            Communication Communication Communication: Impaired Factors Affecting Communication: Hearing impaired  Cognition Arousal: Alert Behavior During Therapy: WFL for tasks assessed/performed   PT - Cognitive impairments: No family/caregiver present to determine baseline                       PT - Cognition Comments: Repeatedly stated his desire not to return home with nephew Following commands: Intact Following commands impaired: Only follows one step commands consistently    Cueing Cueing Techniques: Verbal cues, Tactile cues  Exercises General Exercises - Lower Extremity Ankle Circles/Pumps: AROM, Both, 10 reps Long Arc Quad: AROM, Both, 10 reps    General Comments General comments (skin integrity, edema, etc.): Discussed at length pt's current POC and goals to d/c to a group home      Pertinent Vitals/Pain Pain Assessment Pain Assessment: No/denies pain  Home Living                          Prior Function            PT Goals (current goals can now be found in the care plan section) Acute Rehab PT Goals Patient Stated Goal: to be able to walk at baseline level of function Progress towards PT goals: Progressing toward goals    Frequency    Min 2X/week      PT Plan      Co-evaluation              AM-PAC PT 6 Clicks Mobility   Outcome Measure  Help needed turning from your back to  your side while in a flat bed without using bedrails?: None Help needed moving from lying on your back to sitting on the side of a flat bed without using bedrails?: None Help needed moving to and from a bed to a chair (including a wheelchair)?: None Help needed standing up from a chair using your arms (e.g., wheelchair or bedside chair)?: None Help needed to walk in hospital room?: A Little Help needed climbing 3-5 steps with a railing? : A Little 6 Click Score: 22    End of Session Equipment Utilized During Treatment: Gait belt Activity Tolerance: Patient tolerated treatment well Patient left: in chair;with call bell/phone within reach;with chair alarm set Nurse Communication: Mobility status PT Visit Diagnosis: Repeated falls (R29.6);History of falling (Z91.81);Unsteadiness on feet (R26.81);Muscle weakness (generalized) (M62.81)     Time: 8589-8560 PT Time Calculation (min) (ACUTE ONLY): 29 min  Charges:    $Gait Training: 8-22 mins $Therapeutic Exercise: 8-22 mins PT General Charges $$ ACUTE PT VISIT: 1 Visit                    Darice Bohr, PTA  Darice JAYSON Bohr 12/06/2023, 4:47 PM

## 2023-12-07 DIAGNOSIS — R220 Localized swelling, mass and lump, head: Secondary | ICD-10-CM | POA: Diagnosis not present

## 2023-12-07 LAB — GLUCOSE, CAPILLARY
Glucose-Capillary: 295 mg/dL — ABNORMAL HIGH (ref 70–99)
Glucose-Capillary: 391 mg/dL — ABNORMAL HIGH (ref 70–99)
Glucose-Capillary: 380 mg/dL — ABNORMAL HIGH (ref 70–99)
Glucose-Capillary: 212 mg/dL — ABNORMAL HIGH (ref 70–99)

## 2023-12-07 MED ORDER — DEXAMETHASONE SODIUM PHOSPHATE 10 MG/ML IJ SOLN
6.0000 mg | INTRAMUSCULAR | Status: DC
  Administered 2023-12-07: 6 mg via INTRAVENOUS
  Filled 2023-12-07: qty 1

## 2023-12-07 NOTE — Progress Notes (Signed)
 PT Cancellation Note  Patient Details Name: Larry Callahan MRN: [id number] DOB: [birth year]   Cancelled Treatment:     Pt politely declined in am, returned and pt receiving new IV in Right arm with instructions to maintain position and elevate prior IV site on Left. Continue PT per POC   Darice JAYSON Bohr 12/07/2023, 3:20 PM

## 2023-12-07 NOTE — Inpatient Diabetes Management (Addendum)
 Inpatient Diabetes Program Recommendations  AACE/ADA: New Consensus Statement on Inpatient Glycemic Control (2015)  Target Ranges:  Prepandial:   less than 140 mg/dL      Peak postprandial:   less than 180 mg/dL (1-2 hours)      Critically ill patients:  140 - 180 mg/dL    Latest Reference Range & Units 12/06/23 08:04 12/06/23 12:15 12/06/23 17:02 12/06/23 21:11  Glucose-Capillary 70 - 99 mg/dL 794 (H)  6 units Novolog   342 (H)  10 units Novolog   24 units Semglee   288 (H)  8 units Novolog   228 (H)  2 units Novolog    (H): Data is abnormally high  Latest Reference Range & Units 12/07/23 08:19  Glucose-Capillary 70 - 99 mg/dL 704 (H)  8 units Novolog   24 units Semglee   (H): Data is abnormally high  Home DM Meds: Toujeo  10 units at HS                             Humalog  0-10 units TID   Current Orders: Semglee  24 units daily                              Novolog  Sensitive Correction Scale/ SSI (0-9 units) TID AC + HS     Novolog  3 units TID with meals     MD- Note Decadron  reduced to 4 mg BID yest PM and further reduced to 6mg  Q24H  Pt already getting more than double his home dose of Semglee --Caution with further increases of the Semglee  as steroids weaning down  May consider increasing the Novolog  Meal Coverage to 6 units TID with meals    --Will follow patient during hospitalization--  Adina Rudolpho Arrow RN, MSN, CDCES Diabetes Coordinator Inpatient Glycemic Control Team Team Pager: [phone] (8a-5p)

## 2023-12-07 NOTE — Plan of Care (Signed)
 The patient continues on IV Rocephin  and IV Flagyl for L Facial swelling w/o s/s of adverse effects noted.

## 2023-12-07 NOTE — Progress Notes (Signed)
 PROGRESS NOTE    Larry Callahan  FMW:[id number] DOB: 06/29/1955 DOA: 11/22/2023 PCP: Liana Fish, NP    Brief Narrative:    68 year old male with multiple medical problems including: heart failure with reduced EF 40% secondary to nonischemic cardiomyopathy, a flutter status post ablation on Eliquis , CKD stage IV, hypertension, type 2 diabetes, COPD, OSA, HLD, nonobstructive CAD, CVA.  Patient was recently admitted to the hospital and discharged on 6/2.  During that admission he was seen for COPD exacerbation and AKI superimposed on CKD.  He was discharged home in the custody of his nephew with whom he resides.  He then presented to the ED again on 6/4 requesting SNF placement.  He remained in the ED for 4 days pending SNF arrangements.  Ultimately SNF was found to be cost prohibitive and the patient discharged back home to his nephew on 6/8.  He presents again to the ED on this admission requesting long-term placement.  He reports he is no longer safe at home with his nephew but can also not afford skilled nursing facility placement.  ED physician had concerns of tachypnea with expiratory wheeze and requested admission for COPD exacerbation.  Patient's respiratory status appeared to be at baseline and he is medically cleared for discharge.  Stay was then prolonged pending placement.  Insurance company rejected rehab on 6/18 so discharge was canceled.  Looking into other options.   6/23.  Called to see patient secondary to left facial swelling.  Patient with poor dentition.  Will start Solu-Medrol , Rocephin  and Flagyl.     Assessment & Plan:   Principal Problem:   Left facial swelling Active Problems:   Housing situation unstable   Tongue lesion   Chronic systolic CHF (congestive heart failure) (HCC)   Acute kidney injury superimposed on CKD (HCC)   Essential hypertension   Atrial flutter (HCC)   Type II diabetes mellitus with renal manifestations (HCC)   HLD  (hyperlipidemia)   COPD exacerbation (HCC)   CAD (coronary artery disease)   Metabolic acidosis, normal anion gap (NAG)   Iron  deficiency anemia   Hx of completed stroke   Left facial swelling Suspect odontogenic infection.  Facial swelling is improving. CT with soft tissue swelling.  No discernible abscess Plan: Continue Rocephin  and Flagyl Continue Decadron , wean to 6 mg every 24 hours Monitor vitals and fever curve  Housing situation unstable Rehab was rejected by [employer].   TOC contacted for alternative options Pending placement   Tongue lesion Likely aphthous ulcer Continue to monitor   Chronic systolic CHF (congestive heart failure) (HCC) Patient euvolemic.  No diuresis secondary to kidney function.  On room air.  Last EF 50%.  Continue Coreg .     Acute kidney injury superimposed on CKD (HCC) AKI on CKD stage IV.  Baseline creatinine around 3.5.  Up to 4.7.  Diuretic on hold.  Nephrology following.  No plans for dialysis at this time.   Type II diabetes mellitus with renal manifestations (HCC) Titrate insulin  regimen with steroids on board   Atrial flutter (HCC) Status post ablation.  On Eliquis , will continue.  On Coreg .   Essential hypertension Continue Coreg , Norvasc    COPD exacerbation (HCC) Resolved.  Completed 5-day course of prednisone .  On room air. Oxygen  saturation with vital signs checks   HLD (hyperlipidemia) Continue Crestor    Iron  deficiency anemia Last hemoglobin 8.4 No indication for transfusion   Hx of completed stroke Continue Eliquis     DVT prophylaxis: Eliquis  Code Status: Full  Family Communication: None Disposition Plan: Status is: Observation The patient will require care spanning > 2 midnights and should be moved to inpatient because: Suspected odontogenic infection on IV antibiotics   Level of care: Telemetry Medical  Consultants:  None  Procedures:  None  Antimicrobials: Rocephin  Flagyl     Subjective: Seen and examined.  Sitting up in bed.  No distress.  Improved facial swelling.  Objective: Vitals:   12/06/23 2109 12/07/23 0448 12/07/23 0500 12/07/23 0825  BP: (!) 153/78 (!) 146/71  (!) 151/76  Pulse: 63 74  60  Resp: 18 20    Temp: (!) 97.5 F (36.4 C) (!) 97.3 F (36.3 C)    TempSrc: Oral     SpO2: 100% 100%  100%  Weight:   92.6 kg   Height:        Intake/Output Summary (Last 24 hours) at 12/07/2023 1033 Last data filed at 12/07/2023 1027 Gross per 24 hour  Intake 843.16 ml  Output 650 ml  Net 193.16 ml   Filed Weights   12/05/23 0500 12/06/23 0500 12/07/23 0500  Weight: 89.1 kg 90.4 kg 92.6 kg    Examination:  General exam: No acute distress.  Appears fatigued Respiratory system: Bibasilar crackles.  Normal work of breathing.  Room air Cardiovascular system: S1-S2, RRR, no murmurs, no pedal edema Gastrointestinal system: Soft, NT/ND, normal bowel sounds Central nervous system: Alert and oriented. No focal neurological deficits. Extremities: Symmetric 5 x 5 power. Skin: No rashes, lesions or ulcers Psychiatry: Judgement and insight appear normal. Mood & affect appropriate.     Data Reviewed: I have personally reviewed following labs and imaging studies  CBC: Recent Labs  Lab 12/01/23 0618 12/04/23 0430 12/06/23 0935  WBC 6.6  --  13.4*  NEUTROABS  --   --  12.1*  HGB 8.4* 9.5* 10.2*  HCT 25.8*  --  31.6*  MCV 89.0  --  87.1  PLT 150  --  246   Basic Metabolic Panel: Recent Labs  Lab 12/01/23 0618 12/04/23 0430 12/06/23 0935  NA 138 139 137  K 4.8 4.7 4.7  CL 111 112* 107  CO2 22 22 18*  GLUCOSE 118* 82 279*  BUN 103* 85* 89*  CREATININE 3.18* 3.26* 3.75*  CALCIUM  7.7* 7.9* 8.4*   GFR: Estimated Creatinine Clearance: 21.3 mL/min (A) (by C-G formula based on SCr of 3.75 mg/dL (H)). Liver Function Tests: No results for input(s): AST, ALT, ALKPHOS, BILITOT, PROT, ALBUMIN  in the last 168 hours. No results for  input(s): LIPASE, AMYLASE in the last 168 hours. No results for input(s): AMMONIA in the last 168 hours. Coagulation Profile: No results for input(s): INR, PROTIME in the last 168 hours. Cardiac Enzymes: No results for input(s): CKTOTAL, CKMB, CKMBINDEX, TROPONINI in the last 168 hours. BNP (last 3 results) No results for input(s): PROBNP in the last 8760 hours. HbA1C: No results for input(s): HGBA1C in the last 72 hours. CBG: Recent Labs  Lab 12/06/23 0804 12/06/23 1215 12/06/23 1702 12/06/23 2111 12/07/23 0819  GLUCAP 205* 342* 288* 228* 295*   Lipid Profile: No results for input(s): CHOL, HDL, LDLCALC, TRIG, CHOLHDL, LDLDIRECT in the last 72 hours. Thyroid  Function Tests: No results for input(s): TSH, T4TOTAL, FREET4, T3FREE, THYROIDAB in the last 72 hours. Anemia Panel: No results for input(s): VITAMINB12, FOLATE, FERRITIN, TIBC, IRON , RETICCTPCT in the last 72 hours. Sepsis Labs: Recent Labs  Lab 12/06/23 0935  PROCALCITON <0.10    No results found for this or any  previous visit (from the past 240 hours).       Radiology Studies: CT MAXILLOFACIAL WO CONTRAST Result Date: 12/06/2023 CLINICAL DATA:  Facial swelling, concern for ondotogenic infection. CKD precludes contrast use EXAM: CT MAXILLOFACIAL WITHOUT CONTRAST TECHNIQUE: Multidetector CT imaging of the maxillofacial structures was performed. Multiplanar CT image reconstructions were also generated. RADIATION DOSE REDUCTION: This exam was performed according to the departmental dose-optimization program which includes automated exposure control, adjustment of the mA and/or kV according to patient size and/or use of iterative reconstruction technique. COMPARISON:  None Available. FINDINGS: Osseous: No evidence of acute fracture. Multiple missing teeth. Multiple dental caries, most conspicuous involving the right maxillary teeth. TMJs are located. Orbits: Negative.  No traumatic or inflammatory finding. Sinuses: Clear sinuses. Soft tissues: Question subtle edema in the left upper neck and lower face with slight thickening of the platysma. No discrete, drainable fluid collection. The absence of contrast limits evaluation. Limited intracranial: No significant or unexpected finding. IMPRESSION: Question subtle edema in the left upper neck and lower face with slight thickening of the platysma, potentially infectious but nonspecific. No discrete, drainable fluid collection. The submandibular and parotid glands are unremarkable. Electronically Signed   By: Gilmore GORMAN Molt M.D.   On: 12/06/2023 14:44        Scheduled Meds:  amLODipine   5 mg Oral BID   apixaban   5 mg Oral BID   budesonide -glycopyrrolate -formoterol   2 puff Inhalation BID   carvedilol   3.125 mg Oral BID WC   cyanocobalamin   1,000 mcg Oral Daily   dexamethasone  (DECADRON ) injection  6 mg Intravenous Q24H   ferrous sulfate   325 mg Oral QPM   insulin  aspart  0-5 Units Subcutaneous QHS   insulin  aspart  0-9 Units Subcutaneous TID WC   insulin  aspart  3 Units Subcutaneous TID WC   insulin  glargine-yfgn  24 Units Subcutaneous Daily   pantoprazole   40 mg Oral Daily   rosuvastatin   10 mg Oral QHS   sodium bicarbonate   650 mg Oral TID   Continuous Infusions:  cefTRIAXone  (ROCEPHIN )  IV Stopped (12/06/23 1056)   metronidazole 500 mg (12/06/23 2258)     LOS: 0 days      Calvin KATHEE Robson, MD [facility]   If 7PM-7AM, please contact night-coverage  12/07/2023, 10:33 AM

## 2023-12-08 DIAGNOSIS — R220 Localized swelling, mass and lump, head: Secondary | ICD-10-CM | POA: Diagnosis not present

## 2023-12-08 LAB — GLUCOSE, CAPILLARY
Glucose-Capillary: 144 mg/dL — ABNORMAL HIGH (ref 70–99)
Glucose-Capillary: 251 mg/dL — ABNORMAL HIGH (ref 70–99)
Glucose-Capillary: 288 mg/dL — ABNORMAL HIGH (ref 70–99)
Glucose-Capillary: 277 mg/dL — ABNORMAL HIGH (ref 70–99)

## 2023-12-08 MED ORDER — INSULIN GLARGINE-YFGN 100 UNIT/ML ~~LOC~~ SOLN
28.0000 [IU] | Freq: Every day | SUBCUTANEOUS | Status: DC
  Administered 2023-12-09 – 2023-12-11 (×3): 28 [IU] via SUBCUTANEOUS
  Filled 2023-12-08 (×4): qty 0.28

## 2023-12-08 MED ORDER — INSULIN ASPART 100 UNIT/ML IJ SOLN
5.0000 [IU] | Freq: Three times a day (TID) | INTRAMUSCULAR | Status: DC
  Administered 2023-12-08 (×2): 5 [IU] via SUBCUTANEOUS
  Filled 2023-12-08 (×2): qty 1

## 2023-12-08 MED ORDER — DEXAMETHASONE SODIUM PHOSPHATE 4 MG/ML IJ SOLN
4.0000 mg | INTRAMUSCULAR | Status: AC
  Administered 2023-12-08 – 2023-12-10 (×3): 4 mg via INTRAVENOUS
  Filled 2023-12-08 (×3): qty 1

## 2023-12-08 NOTE — Plan of Care (Signed)

## 2023-12-08 NOTE — Inpatient Diabetes Management (Signed)
 Inpatient Diabetes Program Recommendations  AACE/ADA: New Consensus Statement on Inpatient Glycemic Control   Target Ranges:  Prepandial:   less than 140 mg/dL      Peak postprandial:   less than 180 mg/dL (1-2 hours)      Critically ill patients:  140 - 180 mg/dL    Latest Reference Range & Units 12/07/23 08:19 12/07/23 12:07 12/07/23 16:01 12/07/23 19:55  Glucose-Capillary 70 - 99 mg/dL 704 (H) 608 (H) 619 (H) 212 (H)   Review of Glycemic Control  Diabetes history: DM2 Outpatient Diabetes medications: Toujeo  10 units at bedtime, Humalog  0-10 units TID with meals Current orders for Inpatient glycemic control: Semglee  24 units daily, Novolog  3 units TID with meals, Novolog  0-9 units TID with meals, Novolog  0-5 units at bedtime; Decadron  6 mg Q24H  Inpatient Diabetes Program Recommendations:    Insulin : If steroids are continued as ordered, please consider increasing Semglee  to 28 units daily and meal coverage to Novolog  7 units TID with meals.  Thanks, Earnie Gainer, RN, MSN, CDCES Diabetes Coordinator Inpatient Diabetes Program [phone] (Team Pager from 8am to 5pm)

## 2023-12-08 NOTE — Progress Notes (Signed)
 Physical Therapy Treatment Patient Details Name: Larry Callahan MRN: [id number] DOB: [birth year] Today's Date: 12/08/2023   History of Present Illness Larry Callahan is a 68 year old male with history of CHF, a flutter on Eliquis , CKD, T2DM, and CAD who presenting to the ER for housing concerns. MD diagnoses includes COPD, CHF, HTN, atrial flutter, type II diabetes, HLD, and CAD.    PT Comments  Pt was willing to participate during the session and put forth good effort throughout. Pt remained steady throughout ambulation with RW, but demonstrated poor posture by flexing trunk over RW. VC's were utilized to encourage upright posture during ambulation with minimal pt carryover. Pt was encouraged to end session in chair to promote decreased risk of respiratory distress, but pt refused and desired to go back to bed after ambulation. Pt reported no adverse symptoms during the session with SpO2 and HR WNL throughout on room air. Pt will benefit from continued PT services upon discharge to safely address deficits listed in patient problem list for decreased caregiver assistance and eventual return to PLOF.      If plan is discharge home, recommend the following: A little help with walking and/or transfers;A little help with bathing/dressing/bathroom;Assistance with cooking/housework;Assist for transportation;Help with stairs or ramp for entrance   Can travel by private vehicle     Yes  Equipment Recommendations  Other (comment) (TBD)    Recommendations for Other Services       Precautions / Restrictions Precautions Precautions: Fall Recall of Precautions/Restrictions: Impaired Restrictions Weight Bearing Restrictions Per Provider Order: No     Mobility  Bed Mobility Overal bed mobility: Modified Independent Bed Mobility: Supine to Sit, Sit to Supine Rolling: Modified independent (Device/Increase time), Used rails Sidelying to sit: Modified independent (Device/Increase time),  Used rails Supine to sit: Modified independent (Device/Increase time), Used rails Sit to supine: Modified independent (Device/Increase time), Used rails   General bed mobility comments: Pt used bed rails for bed mobility tasks, but no physical assistance needed    Transfers Overall transfer level: Needs assistance Equipment used: Rolling walker (2 wheels) Transfers: Sit to/from Stand Sit to Stand: Contact guard assist, From elevated surface           General transfer comment: Pt required CGA and elevated bed surface for STS with RW, but remained steady throughout and no LOBs occured.    Ambulation/Gait Ambulation/Gait assistance: Contact guard assist Gait Distance (Feet): 475 Feet Assistive device: Rolling walker (2 wheels) Gait Pattern/deviations: Step-through pattern, Trunk flexed, Decreased stride length, Knee flexed in stance - left, Knee flexed in stance - right Gait velocity: decreased     General Gait Details: Pt demonstrated flexed trunk over RW. VC's were utilized to encourage upright posture with minimal pt carryover, but remained steady throughout ambulation and no LOBs occured.   Stairs             Wheelchair Mobility     Tilt Bed    Modified Rankin (Stroke Patients Only)       Balance Overall balance assessment: Needs assistance Sitting-balance support: Feet supported Sitting balance-Leahy Scale: Normal     Standing balance support: During functional activity, Bilateral upper extremity supported Standing balance-Leahy Scale: Good Standing balance comment: Pt maintains good balance in standing and during ambulation with RW with no LOBs observed                            Communication Communication Communication: Impaired Factors Affecting Communication:  Hearing impaired  Cognition Arousal: Alert Behavior During Therapy: WFL for tasks assessed/performed   PT - Cognitive impairments: No family/caregiver present to determine  baseline                         Following commands: Intact Following commands impaired: Only follows one step commands consistently    Cueing Cueing Techniques: Verbal cues, Tactile cues  Exercises      General Comments        Pertinent Vitals/Pain Pain Assessment Pain Assessment: 0-10 Pain Score: 2  Pain Location: L side of face Pain Descriptors / Indicators: Sore Pain Intervention(s): Monitored during session    Home Living                          Prior Function            PT Goals (current goals can now be found in the care plan section) Progress towards PT goals: Progressing toward goals    Frequency    Min 2X/week      PT Plan      Co-evaluation              AM-PAC PT 6 Clicks Mobility   Outcome Measure  Help needed turning from your back to your side while in a flat bed without using bedrails?: None Help needed moving from lying on your back to sitting on the side of a flat bed without using bedrails?: None Help needed moving to and from a bed to a chair (including a wheelchair)?: None Help needed standing up from a chair using your arms (e.g., wheelchair or bedside chair)?: None Help needed to walk in hospital room?: A Little Help needed climbing 3-5 steps with a railing? : A Little 6 Click Score: 22    End of Session Equipment Utilized During Treatment: Gait belt Activity Tolerance: Patient tolerated treatment well Patient left: in bed;with bed alarm set;with call bell/phone within reach Nurse Communication: Mobility status PT Visit Diagnosis: Repeated falls (R29.6);History of falling (Z91.81);Unsteadiness on feet (R26.81);Muscle weakness (generalized) (M62.81)     Time: 9048-8987 PT Time Calculation (min) (ACUTE ONLY): 21 min  Charges:                            Leontine Ingles, SPT 12/08/23, 1:23 PM

## 2023-12-08 NOTE — Progress Notes (Signed)
 Mobility Specialist - Progress Note  Post-mobility: HR 76, SPO2 99%   12/08/23 1644  Mobility  Activity Ambulated with assistance in room;Ambulated with assistance in hallway  Level of Assistance Contact guard assist, steadying assist  Assistive Device Front wheel walker  Distance Ambulated (ft) 320 ft  Activity Response Tolerated well  Mobility visit 1 Mobility  Mobility Specialist Start Time (ACUTE ONLY) 1609  Mobility Specialist Stop Time (ACUTE ONLY) 1627  Mobility Specialist Time Calculation (min) (ACUTE ONLY) 18 min   Pt supine upon entry, utilizing RA. Pt agreeable to OOB amb within the hallway this date, however very vocal regarding dislike of the recliner. Pt reported hip soreness while at rest. Pt completed bed mob ModI, STS to RW MinA-CGA and amb two lap (~315ft) around the NS CGA--- min cueing for proper upright position and proper body postioning within the RW. Pt returned to the room, left semi fowler with alarm set and needs within reach. RN notified.   America Silvan Mobility Specialist 12/08/23 4:52 PM

## 2023-12-08 NOTE — Progress Notes (Signed)
 PROGRESS NOTE    Larry Callahan  FMW:[id number] DOB: 12-Oct-1955 DOA: 11/22/2023 PCP: Liana Fish, NP    Brief Narrative:    68 year old male with multiple medical problems including: heart failure with reduced EF 40% secondary to nonischemic cardiomyopathy, a flutter status post ablation on Eliquis , CKD stage IV, hypertension, type 2 diabetes, COPD, OSA, HLD, nonobstructive CAD, CVA.  Patient was recently admitted to the hospital and discharged on 6/2.  During that admission he was seen for COPD exacerbation and AKI superimposed on CKD.  He was discharged home in the custody of his nephew with whom he resides.  He then presented to the ED again on 6/4 requesting SNF placement.  He remained in the ED for 4 days pending SNF arrangements.  Ultimately SNF was found to be cost prohibitive and the patient discharged back home to his nephew on 6/8.  He presents again to the ED on this admission requesting long-term placement.  He reports he is no longer safe at home with his nephew but can also not afford skilled nursing facility placement.  ED physician had concerns of tachypnea with expiratory wheeze and requested admission for COPD exacerbation.  Patient's respiratory status appeared to be at baseline and he is medically cleared for discharge.  Stay was then prolonged pending placement.  Insurance company rejected rehab on 6/18 so discharge was canceled.  Looking into other options.   6/23.  Called to see patient secondary to left facial swelling.  Patient with poor dentition.  Will start Solu-Medrol , Rocephin  and Flagyl .     Assessment & Plan:   Principal Problem:   Left facial swelling Active Problems:   Housing situation unstable   Tongue lesion   Chronic systolic CHF (congestive heart failure) (HCC)   Acute kidney injury superimposed on CKD (HCC)   Essential hypertension   Atrial flutter (HCC)   Type II diabetes mellitus with renal manifestations (HCC)   HLD  (hyperlipidemia)   COPD exacerbation (HCC)   CAD (coronary artery disease)   Metabolic acidosis, normal anion gap (NAG)   Iron  deficiency anemia   Hx of completed stroke   Left facial swelling Suspect odontogenic infection.  Facial swelling is improving. CT with soft tissue swelling.  No discernible abscess Plan: Continue Rocephin  and Flagyl , plan for 7-day course Continue Decadron , 4 mg every 24 hours Monitor vitals and fever curve  Housing situation unstable Rehab was rejected by [employer].   TOC contacted for alternative options Pending TOC follow-up for placement   Tongue lesion Likely aphthous ulcer Continue to monitor   Chronic systolic CHF (congestive heart failure) (HCC) Patient euvolemic.  No diuresis secondary to kidney function.   On room air.  Last EF 50%.  Continue Coreg .     Acute kidney injury superimposed on CKD (HCC) AKI on CKD stage IV.  Baseline creatinine around 3.5.   Up to 4.7.  Diuretic on hold.  Nephrology following.   No plans for dialysis at this time.   Type II diabetes mellitus with renal manifestations (HCC) Titrate insulin  regimen with steroids on board   Atrial flutter (HCC) Status post ablation.  On Eliquis , will continue.  On Coreg .   Essential hypertension Continue Coreg , Norvasc    COPD exacerbation (HCC) Resolved.  Completed 5-day course of prednisone .  On room air. Oxygen  saturation with vital signs checks   HLD (hyperlipidemia) Continue Crestor    Iron  deficiency anemia Last hemoglobin 8.4 No indication for transfusion   Hx of completed stroke Continue Eliquis   DVT prophylaxis: Eliquis  Code Status: Full Family Communication: None Disposition Plan: Status is: Observation The patient will require care spanning > 2 midnights and should be moved to inpatient because: Odontogenic infection/facial cellulitis IV antibiotics   Level of care: Telemetry Medical  Consultants:  None  Procedures:   None  Antimicrobials: Rocephin  Flagyl     Subjective: Seen and examined.  No distress.  No acute events overnight  Objective: Vitals:   12/07/23 1618 12/07/23 1957 12/08/23 0601 12/08/23 0831  BP: (!) 144/70 (!) 142/72 (!) 156/73 (!) 150/76  Pulse: 69 62 64 (!) 58  Resp:  18 18 16   Temp: 98.1 F (36.7 C) 98.1 F (36.7 C) 97.8 F (36.6 C) (!) 97 F (36.1 C)  TempSrc:      SpO2: 100% 100% 100% 100%  Weight:   93.4 kg   Height:        Intake/Output Summary (Last 24 hours) at 12/08/2023 1038 Last data filed at 12/08/2023 1000 Gross per 24 hour  Intake 240 ml  Output 1250 ml  Net -1010 ml   Filed Weights   12/06/23 0500 12/07/23 0500 12/08/23 0601  Weight: 90.4 kg 92.6 kg 93.4 kg    Examination:  General exam: NAD.  Appears fatigued Respiratory system: Lungs clear.  Normal work of breathing.  Room air Cardiovascular system: S1-S2, RRR, no murmurs, no pedal edema Gastrointestinal system: Soft, NT/ND, normal bowel sounds Central nervous system: Alert and oriented. No focal neurological deficits. Extremities: Symmetric 5 x 5 power. Skin: No rashes, lesions or ulcers Psychiatry: Judgement and insight appear normal. Mood & affect appropriate.     Data Reviewed: I have personally reviewed following labs and imaging studies  CBC: Recent Labs  Lab 12/04/23 0430 12/06/23 0935  WBC  --  13.4*  NEUTROABS  --  12.1*  HGB 9.5* 10.2*  HCT  --  31.6*  MCV  --  87.1  PLT  --  246   Basic Metabolic Panel: Recent Labs  Lab 12/04/23 0430 12/06/23 0935  NA 139 137  K 4.7 4.7  CL 112* 107  CO2 22 18*  GLUCOSE 82 279*  BUN 85* 89*  CREATININE 3.26* 3.75*  CALCIUM  7.9* 8.4*   GFR: Estimated Creatinine Clearance: 21.4 mL/min (A) (by C-G formula based on SCr of 3.75 mg/dL (H)). Liver Function Tests: No results for input(s): AST, ALT, ALKPHOS, BILITOT, PROT, ALBUMIN  in the last 168 hours. No results for input(s): LIPASE, AMYLASE in the last 168  hours. No results for input(s): AMMONIA in the last 168 hours. Coagulation Profile: No results for input(s): INR, PROTIME in the last 168 hours. Cardiac Enzymes: No results for input(s): CKTOTAL, CKMB, CKMBINDEX, TROPONINI in the last 168 hours. BNP (last 3 results) No results for input(s): PROBNP in the last 8760 hours. HbA1C: No results for input(s): HGBA1C in the last 72 hours. CBG: Recent Labs  Lab 12/07/23 0819 12/07/23 1207 12/07/23 1601 12/07/23 1955 12/08/23 0833  GLUCAP 295* 391* 380* 212* 144*   Lipid Profile: No results for input(s): CHOL, HDL, LDLCALC, TRIG, CHOLHDL, LDLDIRECT in the last 72 hours. Thyroid  Function Tests: No results for input(s): TSH, T4TOTAL, FREET4, T3FREE, THYROIDAB in the last 72 hours. Anemia Panel: No results for input(s): VITAMINB12, FOLATE, FERRITIN, TIBC, IRON , RETICCTPCT in the last 72 hours. Sepsis Labs: Recent Labs  Lab 12/06/23 0935  PROCALCITON <0.10    No results found for this or any previous visit (from the past 240 hours).       Radiology  Studies: No results found.       Scheduled Meds:  amLODipine   5 mg Oral BID   apixaban   5 mg Oral BID   budesonide -glycopyrrolate -formoterol   2 puff Inhalation BID   carvedilol   3.125 mg Oral BID WC   cyanocobalamin   1,000 mcg Oral Daily   dexamethasone  (DECADRON ) injection  4 mg Intravenous Q24H   ferrous sulfate   325 mg Oral QPM   insulin  aspart  0-5 Units Subcutaneous QHS   insulin  aspart  0-9 Units Subcutaneous TID WC   insulin  aspart  5 Units Subcutaneous TID WC   [START ON 12/09/2023] insulin  glargine-yfgn  28 Units Subcutaneous Daily   pantoprazole   40 mg Oral Daily   rosuvastatin   10 mg Oral QHS   sodium bicarbonate   650 mg Oral TID   Continuous Infusions:  cefTRIAXone  (ROCEPHIN )  IV Stopped (12/07/23 2248)   metronidazole  500 mg (12/07/23 2313)     LOS: 0 days      Calvin KATHEE Robson, MD [facility]   If 7PM-7AM, please contact night-coverage  12/08/2023, 10:38 AM

## 2023-12-09 DIAGNOSIS — R220 Localized swelling, mass and lump, head: Secondary | ICD-10-CM | POA: Diagnosis not present

## 2023-12-09 LAB — GLUCOSE, CAPILLARY
Glucose-Capillary: 222 mg/dL — ABNORMAL HIGH (ref 70–99)
Glucose-Capillary: 333 mg/dL — ABNORMAL HIGH (ref 70–99)
Glucose-Capillary: 323 mg/dL — ABNORMAL HIGH (ref 70–99)
Glucose-Capillary: 235 mg/dL — ABNORMAL HIGH (ref 70–99)

## 2023-12-09 MED ORDER — INSULIN ASPART 100 UNIT/ML IJ SOLN
7.0000 [IU] | Freq: Three times a day (TID) | INTRAMUSCULAR | Status: DC
  Administered 2023-12-09 – 2023-12-11 (×8): 7 [IU] via SUBCUTANEOUS
  Filled 2023-12-09 (×8): qty 1

## 2023-12-09 NOTE — Progress Notes (Signed)
 Mobility Specialist - Progress Note   12/09/23 0940  Mobility  Activity Ambulated with assistance in hallway;Stood at bedside;Dangled on edge of bed  Level of Assistance Standby assist, set-up cues, supervision of patient - no hands on  Assistive Device Front wheel walker  Distance Ambulated (ft) 480 ft  Activity Response Tolerated well  Mobility Referral Yes  Mobility visit 1 Mobility  Mobility Specialist Start Time (ACUTE ONLY) [id number]  Mobility Specialist Stop Time (ACUTE ONLY) [id number]  Mobility Specialist Time Calculation (min) (ACUTE ONLY) 16 min   Pt semi-supine in bed on RA upon arrival. Pt completes bed mobility, STS from heightened bed surface, and ambulates 3 laps around NS Supervision with no hands on assistance needed. Pt returns to bed with needs in reach and bed alarm activated.   Otho Louder  Mobility Specialist  12/09/23 9:42 AM

## 2023-12-09 NOTE — Progress Notes (Signed)
 PROGRESS NOTE    Larry Callahan  FMW:[id number] DOB: 08/09/1955 DOA: 11/22/2023 PCP: Liana Fish, NP    Brief Narrative:    68 year old male with multiple medical problems including: heart failure with reduced EF 40% secondary to nonischemic cardiomyopathy, a flutter status post ablation on Eliquis , CKD stage IV, hypertension, type 2 diabetes, COPD, OSA, HLD, nonobstructive CAD, CVA.  Patient was recently admitted to the hospital and discharged on 6/2.  During that admission he was seen for COPD exacerbation and AKI superimposed on CKD.  He was discharged home in the custody of his nephew with whom he resides.  He then presented to the ED again on 6/4 requesting SNF placement.  He remained in the ED for 4 days pending SNF arrangements.  Ultimately SNF was found to be cost prohibitive and the patient discharged back home to his nephew on 6/8.  He presents again to the ED on this admission requesting long-term placement.  He reports he is no longer safe at home with his nephew but can also not afford skilled nursing facility placement.  ED physician had concerns of tachypnea with expiratory wheeze and requested admission for COPD exacerbation.  Patient's respiratory status appeared to be at baseline and he is medically cleared for discharge.  Stay was then prolonged pending placement.  Insurance company rejected rehab on 6/18 so discharge was canceled.  Looking into other options.   6/23.  Called to see patient secondary to left facial swelling.  Patient with poor dentition.  Will start Solu-Medrol , Rocephin  and Flagyl .     Assessment & Plan:   Principal Problem:   Left facial swelling Active Problems:   Housing situation unstable   Tongue lesion   Chronic systolic CHF (congestive heart failure) (HCC)   Acute kidney injury superimposed on CKD (HCC)   Essential hypertension   Atrial flutter (HCC)   Type II diabetes mellitus with renal manifestations (HCC)   HLD  (hyperlipidemia)   COPD exacerbation (HCC)   CAD (coronary artery disease)   Metabolic acidosis, normal anion gap (NAG)   Iron  deficiency anemia   Hx of completed stroke   Left facial swelling Suspect odontogenic infection.  Facial swelling is improving. CT with soft tissue swelling.  No discernible abscess Plan: Continue Rocephin  and Flagyl , plan for 7-day course Continue Decadron , 4 mg every 24 hours for remained of 7 day course Monitor vitals and fever curve Medically stable for discharge  Housing situation unstable Rehab was rejected by insurance company.   TOC contacted for alternative options Pending TOC follow-up for placement   Tongue lesion Likely aphthous ulcer Continue to monitor   Chronic systolic CHF (congestive heart failure) (HCC) Patient euvolemic.  No diuresis secondary to kidney function.   On room air.  Last EF 50%.  Continue Coreg .     Acute kidney injury superimposed on CKD (HCC) AKI on CKD stage IV.  Baseline creatinine around 3.5.   Up to 4.7.  Diuretic on hold.  Nephrology following.   No plans for dialysis at this time. Recheck kidney function 6/29   Type II diabetes mellitus with renal manifestations (HCC) Titrate insulin  regimen with steroids on board   Atrial flutter (HCC) Status post ablation.  On Eliquis , will continue.  On Coreg .   Essential hypertension Continue Coreg , Norvasc    COPD exacerbation (HCC) Resolved.  Completed 5-day course of prednisone .   On room air. Oxygen  saturation with vital signs checks   HLD (hyperlipidemia) Continue Crestor    Iron  deficiency anemia Last  hemoglobin 8.4 No indication for transfusion Recheck hemoglobin 6/29   Hx of completed stroke Continue Eliquis     DVT prophylaxis: Eliquis  Code Status: Full Family Communication: None Disposition Plan: Status is: Observation The patient will require care spanning > 2 midnights and should be moved to inpatient because: Odontogenic infection/facial  cellulitis IV antibiotics   Level of care: Telemetry Medical  Consultants:  None  Procedures:  None  Antimicrobials: Rocephin  Flagyl     Subjective: Seen and examined.  Resting in bed.  No distress  Objective: Vitals:   12/08/23 1637 12/08/23 2027 12/09/23 0436 12/09/23 0753  BP: (!) 152/71 (!) 144/80 (!) 158/76 (!) 158/72  Pulse: 65 69 63 61  Resp: 18 18  18   Temp: 98.5 F (36.9 C) 97.6 F (36.4 C) 97.9 F (36.6 C) 97.7 F (36.5 C)  TempSrc: Axillary   Oral  SpO2: 100% 100% 98% 98%  Weight:      Height:        Intake/Output Summary (Last 24 hours) at 12/09/2023 1121 Last data filed at 12/09/2023 1037 Gross per 24 hour  Intake 480 ml  Output 275 ml  Net 205 ml   Filed Weights   12/06/23 0500 12/07/23 0500 12/08/23 0601  Weight: 90.4 kg 92.6 kg 93.4 kg    Examination:  General exam: No acute distress Respiratory system: Lungs clear.  Normal work of breathing.  Room air Cardiovascular system: S1-S2, RRR, no murmurs, no pedal edema Gastrointestinal system: Soft, NT/ND, normal bowel sounds Central nervous system: Alert and oriented. No focal neurological deficits. Extremities: Symmetric 5 x 5 power. Skin: No rashes, lesions or ulcers Psychiatry: Judgement and insight appear normal. Mood & affect appropriate.     Data Reviewed: I have personally reviewed following labs and imaging studies  CBC: Recent Labs  Lab 12/04/23 0430 12/06/23 0935  WBC  --  13.4*  NEUTROABS  --  12.1*  HGB 9.5* 10.2*  HCT  --  31.6*  MCV  --  87.1  PLT  --  246   Basic Metabolic Panel: Recent Labs  Lab 12/04/23 0430 12/06/23 0935  NA 139 137  K 4.7 4.7  CL 112* 107  CO2 22 18*  GLUCOSE 82 279*  BUN 85* 89*  CREATININE 3.26* 3.75*  CALCIUM  7.9* 8.4*   GFR: Estimated Creatinine Clearance: 21.4 mL/min (A) (by C-G formula based on SCr of 3.75 mg/dL (H)). Liver Function Tests: No results for input(s): AST, ALT, ALKPHOS, BILITOT, PROT, ALBUMIN  in the  last 168 hours. No results for input(s): LIPASE, AMYLASE in the last 168 hours. No results for input(s): AMMONIA in the last 168 hours. Coagulation Profile: No results for input(s): INR, PROTIME in the last 168 hours. Cardiac Enzymes: No results for input(s): CKTOTAL, CKMB, CKMBINDEX, TROPONINI in the last 168 hours. BNP (last 3 results) No results for input(s): PROBNP in the last 8760 hours. HbA1C: No results for input(s): HGBA1C in the last 72 hours. CBG: Recent Labs  Lab 12/08/23 0833 12/08/23 1139 12/08/23 1636 12/08/23 2031 12/09/23 0810  GLUCAP 144* 251* 288* 277* 222*   Lipid Profile: No results for input(s): CHOL, HDL, LDLCALC, TRIG, CHOLHDL, LDLDIRECT in the last 72 hours. Thyroid  Function Tests: No results for input(s): TSH, T4TOTAL, FREET4, T3FREE, THYROIDAB in the last 72 hours. Anemia Panel: No results for input(s): VITAMINB12, FOLATE, FERRITIN, TIBC, IRON , RETICCTPCT in the last 72 hours. Sepsis Labs: Recent Labs  Lab 12/06/23 0935  PROCALCITON <0.10    No results found for this or  any previous visit (from the past 240 hours).       Radiology Studies: No results found.       Scheduled Meds:  amLODipine   5 mg Oral BID   apixaban   5 mg Oral BID   budesonide -glycopyrrolate -formoterol   2 puff Inhalation BID   carvedilol   3.125 mg Oral BID WC   cyanocobalamin   1,000 mcg Oral Daily   dexamethasone  (DECADRON ) injection  4 mg Intravenous Q24H   ferrous sulfate   325 mg Oral QPM   insulin  aspart  0-5 Units Subcutaneous QHS   insulin  aspart  0-9 Units Subcutaneous TID WC   insulin  aspart  7 Units Subcutaneous TID WC   insulin  glargine-yfgn  28 Units Subcutaneous Daily   pantoprazole   40 mg Oral Daily   rosuvastatin   10 mg Oral QHS   sodium bicarbonate   650 mg Oral TID   Continuous Infusions:  cefTRIAXone  (ROCEPHIN )  IV 2 g (12/08/23 1147)   metronidazole  Stopped (12/08/23 2356)     LOS: 0  days      Calvin KATHEE Robson, MD [facility]   If 7PM-7AM, please contact night-coverage  12/09/2023, 11:21 AM

## 2023-12-09 NOTE — Plan of Care (Signed)

## 2023-12-10 DIAGNOSIS — R220 Localized swelling, mass and lump, head: Secondary | ICD-10-CM | POA: Diagnosis not present

## 2023-12-10 LAB — BASIC METABOLIC PANEL WITH GFR
Anion gap: 7 (ref 5–15)
BUN: 110 mg/dL — ABNORMAL HIGH (ref 8–23)
CO2: 20 mmol/L — ABNORMAL LOW (ref 22–32)
Calcium: 7.9 mg/dL — ABNORMAL LOW (ref 8.9–10.3)
Chloride: 112 mmol/L — ABNORMAL HIGH (ref 98–111)
Creatinine, Ser: 3.45 mg/dL — ABNORMAL HIGH (ref 0.61–1.24)
GFR, Estimated: 18 mL/min — ABNORMAL LOW (ref >60)
Glucose, Bld: 184 mg/dL — ABNORMAL HIGH (ref 70–99)
Potassium: 5.3 mmol/L — ABNORMAL HIGH (ref 3.5–5.1)
Sodium: 139 mmol/L (ref 135–145)

## 2023-12-10 LAB — CBC WITH DIFFERENTIAL/PLATELET
Abs Immature Granulocytes: 0.12 10*3/uL — ABNORMAL HIGH (ref 0.00–0.07)
Basophils Absolute: 0 10*3/uL (ref 0.0–0.1)
Basophils Relative: 0 %
Eosinophils Absolute: 0 10*3/uL (ref 0.0–0.5)
Eosinophils Relative: 0 %
HCT: 28 % — ABNORMAL LOW (ref 39.0–52.0)
Hemoglobin: 9 g/dL — ABNORMAL LOW (ref 13.0–17.0)
Immature Granulocytes: 1 %
Lymphocytes Relative: 7 %
Lymphs Abs: 0.7 10*3/uL (ref 0.7–4.0)
MCH: 28.2 pg (ref 26.0–34.0)
MCHC: 32.1 g/dL (ref 30.0–36.0)
MCV: 87.8 fL (ref 80.0–100.0)
Monocytes Absolute: 0.2 10*3/uL (ref 0.1–1.0)
Monocytes Relative: 2 %
Neutro Abs: 8.6 10*3/uL — ABNORMAL HIGH (ref 1.7–7.7)
Neutrophils Relative %: 90 %
Platelets: 198 10*3/uL (ref 150–400)
RBC: 3.19 MIL/uL — ABNORMAL LOW (ref 4.22–5.81)
RDW: 13.4 % (ref 11.5–15.5)
WBC: 9.7 10*3/uL (ref 4.0–10.5)
nRBC: 0 % (ref 0.0–0.2)

## 2023-12-10 LAB — GLUCOSE, CAPILLARY
Glucose-Capillary: 140 mg/dL — ABNORMAL HIGH (ref 70–99)
Glucose-Capillary: 229 mg/dL — ABNORMAL HIGH (ref 70–99)
Glucose-Capillary: 255 mg/dL — ABNORMAL HIGH (ref 70–99)
Glucose-Capillary: 173 mg/dL — ABNORMAL HIGH (ref 70–99)

## 2023-12-10 MED ORDER — APIXABAN 5 MG PO TABS
5.0000 mg | ORAL_TABLET | Freq: Two times a day (BID) | ORAL | Status: DC
  Administered 2023-12-11 – 2023-12-18 (×15): 5 mg via ORAL
  Filled 2023-12-10 (×15): qty 1

## 2023-12-10 NOTE — Progress Notes (Signed)
 PROGRESS NOTE    Larry Callahan  FMW:[id number] DOB: 1956-02-04 DOA: 11/22/2023 PCP: Liana Fish, NP    Brief Narrative:    68 year old male with multiple medical problems including: heart failure with reduced EF 40% secondary to nonischemic cardiomyopathy, a flutter status post ablation on Eliquis , CKD stage IV, hypertension, type 2 diabetes, COPD, OSA, HLD, nonobstructive CAD, CVA.  Patient was recently admitted to the hospital and discharged on 6/2.  During that admission he was seen for COPD exacerbation and AKI superimposed on CKD.  He was discharged home in the custody of his nephew with whom he resides.  He then presented to the ED again on 6/4 requesting SNF placement.  He remained in the ED for 4 days pending SNF arrangements.  Ultimately SNF was found to be cost prohibitive and the patient discharged back home to his nephew on 6/8.  He presents again to the ED on this admission requesting long-term placement.  He reports he is no longer safe at home with his nephew but can also not afford skilled nursing facility placement.  ED physician had concerns of tachypnea with expiratory wheeze and requested admission for COPD exacerbation.  Patient's respiratory status appeared to be at baseline and he is medically cleared for discharge.  Stay was then prolonged pending placement.  Insurance company rejected rehab on 6/18 so discharge was canceled.  Looking into other options.   6/23.  Called to see patient secondary to left facial swelling.  Patient with poor dentition.  Will start Solu-Medrol , Rocephin  and Flagyl .     Assessment & Plan:   Principal Problem:   Left facial swelling Active Problems:   Housing situation unstable   Tongue lesion   Chronic systolic CHF (congestive heart failure) (HCC)   Acute kidney injury superimposed on CKD (HCC)   Essential hypertension   Atrial flutter (HCC)   Type II diabetes mellitus with renal manifestations (HCC)   HLD  (hyperlipidemia)   COPD exacerbation (HCC)   CAD (coronary artery disease)   Metabolic acidosis, normal anion gap (NAG)   Iron  deficiency anemia   Hx of completed stroke   Left facial swelling Suspect odontogenic infection.  Facial swelling is improving. CT with soft tissue swelling.  No discernible abscess Plan: Continue Rocephin  and Flagyl , plan for 7-day course.  Last dose today Continue Decadron  4 mg every 24 hours.  Last dose today Monitor vitals and fever curve Ready for discharge  Housing situation unstable Rehab was rejected by insurance company.   TOC contacted for alternative options TOC follow-up   Tongue lesion Likely aphthous ulcer Continue to monitor   Chronic systolic CHF (congestive heart failure) (HCC) Patient euvolemic.  No diuresis secondary to kidney function.   On room air.  Last EF 50%.  Continue Coreg .     Acute kidney injury superimposed on CKD (HCC) AKI on CKD stage IV.  Baseline creatinine around 3.5.   Up to 4.7.  Diuretic on hold.  Nephrology following.   No plans for dialysis at this time. Recheck kidney function 6/29   Type II diabetes mellitus with renal manifestations (HCC) Titrate insulin  regimen with steroids on board   Atrial flutter (HCC) Status post ablation.  On Eliquis , will continue.  On Coreg .   Essential hypertension Continue Coreg , Norvasc    COPD exacerbation (HCC) Resolved.  Completed 5-day course of prednisone .   On room air. Oxygen  saturation with vital signs checks   HLD (hyperlipidemia) Continue Crestor    Iron  deficiency anemia Last hemoglobin 8.4  No indication for transfusion Recheck hemoglobin 6/29   Hx of completed stroke Continue Eliquis     DVT prophylaxis: Eliquis  Code Status: Full Family Communication: None Disposition Plan: Status is: Observation The patient will require care spanning > 2 midnights and should be moved to inpatient because: Odontogenic infection/facial cellulitis IV  antibiotics   Level of care: Telemetry Medical  Consultants:  None  Procedures:  None  Antimicrobials:   Subjective: Seen and examined.  Resting in bed.  No distress.  No complaints.  Objective: Vitals:   12/09/23 1735 12/09/23 2016 12/10/23 0444 12/10/23 0743  BP: (!) 148/70 (!) 150/80 (!) 153/80 (!) 152/76  Pulse:  68 63 60  Resp:   18 19  Temp:  98.8 F (37.1 C)  98.6 F (37 C)  TempSrc:  Oral    SpO2:  100% 99% 100%  Weight:   92.7 kg   Height:        Intake/Output Summary (Last 24 hours) at 12/10/2023 1059 Last data filed at 12/10/2023 0903 Gross per 24 hour  Intake 768.85 ml  Output 1550 ml  Net -781.15 ml   Filed Weights   12/07/23 0500 12/08/23 0601 12/10/23 0444  Weight: 92.6 kg 93.4 kg 92.7 kg    Examination:  General exam: NAD.  Appears fatigued Respiratory system: Lungs clear.  Normal work of breathing.  Room air Cardiovascular system: S1-S2, RRR, no murmurs, no pedal edema Gastrointestinal system: Soft, NT/ND, normal bowel sounds Central nervous system: Alert and oriented. No focal neurological deficits. Extremities: Symmetric 5 x 5 power. Skin: No rashes, lesions or ulcers Psychiatry: Judgement and insight appear normal. Mood & affect appropriate.     Data Reviewed: I have personally reviewed following labs and imaging studies  CBC: Recent Labs  Lab 12/04/23 0430 12/06/23 0935 12/10/23 0339  WBC  --  13.4* 9.7  NEUTROABS  --  12.1* 8.6*  HGB 9.5* 10.2* 9.0*  HCT  --  31.6* 28.0*  MCV  --  87.1 87.8  PLT  --  246 198   Basic Metabolic Panel: Recent Labs  Lab 12/04/23 0430 12/06/23 0935 12/10/23 0339  NA 139 137 139  K 4.7 4.7 5.3*  CL 112* 107 112*  CO2 22 18* 20*  GLUCOSE 82 279* 184*  BUN 85* 89* 110*  CREATININE 3.26* 3.75* 3.45*  CALCIUM  7.9* 8.4* 7.9*   GFR: Estimated Creatinine Clearance: 23.2 mL/min (A) (by C-G formula based on SCr of 3.45 mg/dL (H)). Liver Function Tests: No results for input(s): AST,  ALT, ALKPHOS, BILITOT, PROT, ALBUMIN  in the last 168 hours. No results for input(s): LIPASE, AMYLASE in the last 168 hours. No results for input(s): AMMONIA in the last 168 hours. Coagulation Profile: No results for input(s): INR, PROTIME in the last 168 hours. Cardiac Enzymes: No results for input(s): CKTOTAL, CKMB, CKMBINDEX, TROPONINI in the last 168 hours. BNP (last 3 results) No results for input(s): PROBNP in the last 8760 hours. HbA1C: No results for input(s): HGBA1C in the last 72 hours. CBG: Recent Labs  Lab 12/09/23 0810 12/09/23 1234 12/09/23 1653 12/09/23 2012 12/10/23 0742  GLUCAP 222* 333* 323* 235* 140*   Lipid Profile: No results for input(s): CHOL, HDL, LDLCALC, TRIG, CHOLHDL, LDLDIRECT in the last 72 hours. Thyroid  Function Tests: No results for input(s): TSH, T4TOTAL, FREET4, T3FREE, THYROIDAB in the last 72 hours. Anemia Panel: No results for input(s): VITAMINB12, FOLATE, FERRITIN, TIBC, IRON , RETICCTPCT in the last 72 hours. Sepsis Labs: Recent Labs  Lab 12/06/23 [phone]  PROCALCITON <0.10    No results found for this or any previous visit (from the past 240 hours).       Radiology Studies: No results found.       Scheduled Meds:  amLODipine   5 mg Oral BID   apixaban   5 mg Oral BID   budesonide -glycopyrrolate -formoterol   2 puff Inhalation BID   carvedilol   3.125 mg Oral BID WC   cyanocobalamin   1,000 mcg Oral Daily   dexamethasone  (DECADRON ) injection  4 mg Intravenous Q24H   ferrous sulfate   325 mg Oral QPM   insulin  aspart  0-5 Units Subcutaneous QHS   insulin  aspart  0-9 Units Subcutaneous TID WC   insulin  aspart  7 Units Subcutaneous TID WC   insulin  glargine-yfgn  28 Units Subcutaneous Daily   pantoprazole   40 mg Oral Daily   rosuvastatin   10 mg Oral QHS   sodium bicarbonate   650 mg Oral TID   Continuous Infusions:  cefTRIAXone  (ROCEPHIN )  IV 2 g (12/09/23 1333)    metronidazole  Stopped (12/09/23 2346)     LOS: 0 days      Larry KATHEE Robson, MD [facility]   If 7PM-7AM, please contact night-coverage  12/10/2023, 10:59 AM

## 2023-12-10 NOTE — Plan of Care (Signed)

## 2023-12-11 DIAGNOSIS — R220 Localized swelling, mass and lump, head: Secondary | ICD-10-CM | POA: Diagnosis not present

## 2023-12-11 LAB — GLUCOSE, CAPILLARY
Glucose-Capillary: 253 mg/dL — ABNORMAL HIGH (ref 70–99)
Glucose-Capillary: 260 mg/dL — ABNORMAL HIGH (ref 70–99)
Glucose-Capillary: 190 mg/dL — ABNORMAL HIGH (ref 70–99)
Glucose-Capillary: 114 mg/dL — ABNORMAL HIGH (ref 70–99)

## 2023-12-11 NOTE — Progress Notes (Signed)
 PT Cancellation Note  Patient Details Name: Larry Callahan MRN: [id number] DOB: Nov 24, 1955   Cancelled Treatment:    Reason Eval/Treat Not Completed: Patient declined to participate with PT services this date secondary to having already ambulated with mobility techs earlier in the day.  Pt encouraged to increase amount of daily activity but continued to adamantly refuse.  Will attempt to see pt at a future date/time as medically appropriate.    CHARM Glendia Bertin PT, DPT 12/11/23, 3:20 PM

## 2023-12-11 NOTE — Progress Notes (Signed)
 PROGRESS NOTE    Larry Callahan  FMW:[id number] DOB: Mar 24, 1956 DOA: 11/22/2023 PCP: Liana Fish, NP    Brief Narrative:    68 year old male with multiple medical problems including: heart failure with reduced EF 40% secondary to nonischemic cardiomyopathy, a flutter status post ablation on Eliquis , CKD stage IV, hypertension, type 2 diabetes, COPD, OSA, HLD, nonobstructive CAD, CVA.  Patient was recently admitted to the hospital and discharged on 6/2.  During that admission he was seen for COPD exacerbation and AKI superimposed on CKD.  He was discharged home in the custody of his nephew with whom he resides.  He then presented to the ED again on 6/4 requesting SNF placement.  He remained in the ED for 4 days pending SNF arrangements.  Ultimately SNF was found to be cost prohibitive and the patient discharged back home to his nephew on 6/8.  He presents again to the ED on this admission requesting long-term placement.  He reports he is no longer safe at home with his nephew but can also not afford skilled nursing facility placement.  ED physician had concerns of tachypnea with expiratory wheeze and requested admission for COPD exacerbation.  Patient's respiratory status appeared to be at baseline and he is medically cleared for discharge.  Stay was then prolonged pending placement.  Insurance company rejected rehab on 6/18 so discharge was canceled.  Looking into other options.   6/23.  Called to see patient secondary to left facial swelling.  Patient with poor dentition.  Will start Solu-Medrol , Rocephin  and Flagyl .     Assessment & Plan:   Principal Problem:   Left facial swelling Active Problems:   Housing situation unstable   Tongue lesion   Chronic systolic CHF (congestive heart failure) (HCC)   Acute kidney injury superimposed on CKD (HCC)   Essential hypertension   Atrial flutter (HCC)   Type II diabetes mellitus with renal manifestations (HCC)   HLD  (hyperlipidemia)   COPD exacerbation (HCC)   CAD (coronary artery disease)   Metabolic acidosis, normal anion gap (NAG)   Iron  deficiency anemia   Hx of completed stroke   Left facial swelling Suspect odontogenic infection.  Facial swelling is improving. CT with soft tissue swelling.  No discernible abscess Plan: Completed 7-day course of Rocephin  and metronidazole .  Completed course on 6/29.  Decadron  IV also discontinued 6/29.  Patient is medically ready for discharge.  Pending safe disposition plan.  Housing situation unstable Rehab was rejected by [employer].   TOC contacted for alternative options TOC follow-up   Tongue lesion Likely aphthous ulcer Continue to monitor   Chronic systolic CHF (congestive heart failure) (HCC) Patient euvolemic.  No diuresis secondary to kidney function.   On room air.  Last EF 50%.  Continue Coreg .     Acute kidney injury superimposed on CKD (HCC) AKI on CKD stage IV.  Baseline creatinine around 3.5.   Up to 4.7.  Diuretic on hold.  Nephrology following.   No plans for dialysis at this time. Kidney function stable   Type II diabetes mellitus with renal manifestations (HCC) Titrate insulin  regimen with steroids on board   Atrial flutter (HCC) Status post ablation.  On Eliquis , will continue.  On Coreg .   Essential hypertension Continue Coreg , Norvasc    COPD exacerbation (HCC) Resolved.  Completed 5-day course of prednisone .   On room air. Oxygen  saturation with vital signs checks   HLD (hyperlipidemia) Continue Crestor    Iron  deficiency anemia Last hemoglobin 8.4 No indication  for transfusion Recheck hemoglobin 6/29   Hx of completed stroke Continue Eliquis     DVT prophylaxis: Eliquis  Code Status: Full Family Communication: None Disposition Plan: Status is: Observation The patient will require care spanning > 2 midnights and should be moved to inpatient because: Unsafe discharge plan.  Medically stable for  dc.   Level of care: Telemetry Medical  Consultants:  None  Procedures:  None  Antimicrobials:   Subjective: Seen and examined.  Resting in bed.  No complaints  Objective: Vitals:   12/10/23 2027 12/11/23 0430 12/11/23 0444 12/11/23 0747  BP: (!) 147/69  (!) 156/81 (!) 155/81  Pulse: 65  64 71  Resp: 16  18 18   Temp: 98 F (36.7 C)  98.1 F (36.7 C) 98.3 F (36.8 C)  TempSrc: Oral  Oral Oral  SpO2: 96%  100% 100%  Weight:  95.6 kg    Height:        Intake/Output Summary (Last 24 hours) at 12/11/2023 1151 Last data filed at 12/11/2023 0636 Gross per 24 hour  Intake 1278.91 ml  Output 650 ml  Net 628.91 ml   Filed Weights   12/08/23 0601 12/10/23 0444 12/11/23 0430  Weight: 93.4 kg 92.7 kg 95.6 kg    Examination:  General exam: No acute distress.  Appears fatigued Respiratory system: Lungs clear.  Normal work of breathing.  Room air Cardiovascular system: S1-S2, RRR, no murmurs, no pedal edema Gastrointestinal system: Soft, NT/ND, normal bowel sounds Central nervous system: Alert and oriented. No focal neurological deficits. Extremities: Symmetric 5 x 5 power. Skin: No rashes, lesions or ulcers Psychiatry: Judgement and insight appear normal. Mood & affect appropriate.     Data Reviewed: I have personally reviewed following labs and imaging studies  CBC: Recent Labs  Lab 12/06/23 0935 12/10/23 0339  WBC 13.4* 9.7  NEUTROABS 12.1* 8.6*  HGB 10.2* 9.0*  HCT 31.6* 28.0*  MCV 87.1 87.8  PLT 246 198   Basic Metabolic Panel: Recent Labs  Lab 12/06/23 0935 12/10/23 0339  NA 137 139  K 4.7 5.3*  CL 107 112*  CO2 18* 20*  GLUCOSE 279* 184*  BUN 89* 110*  CREATININE 3.75* 3.45*  CALCIUM  8.4* 7.9*   GFR: Estimated Creatinine Clearance: 23.5 mL/min (A) (by C-G formula based on SCr of 3.45 mg/dL (H)). Liver Function Tests: No results for input(s): AST, ALT, ALKPHOS, BILITOT, PROT, ALBUMIN  in the last 168 hours. No results for  input(s): LIPASE, AMYLASE in the last 168 hours. No results for input(s): AMMONIA in the last 168 hours. Coagulation Profile: No results for input(s): INR, PROTIME in the last 168 hours. Cardiac Enzymes: No results for input(s): CKTOTAL, CKMB, CKMBINDEX, TROPONINI in the last 168 hours. BNP (last 3 results) No results for input(s): PROBNP in the last 8760 hours. HbA1C: No results for input(s): HGBA1C in the last 72 hours. CBG: Recent Labs  Lab 12/10/23 1127 12/10/23 1638 12/10/23 2028 12/11/23 0750 12/11/23 1140  GLUCAP 229* 255* 173* 253* 260*   Lipid Profile: No results for input(s): CHOL, HDL, LDLCALC, TRIG, CHOLHDL, LDLDIRECT in the last 72 hours. Thyroid  Function Tests: No results for input(s): TSH, T4TOTAL, FREET4, T3FREE, THYROIDAB in the last 72 hours. Anemia Panel: No results for input(s): VITAMINB12, FOLATE, FERRITIN, TIBC, IRON , RETICCTPCT in the last 72 hours. Sepsis Labs: Recent Labs  Lab 12/06/23 0935  PROCALCITON <0.10    No results found for this or any previous visit (from the past 240 hours).  Radiology Studies: No results found.       Scheduled Meds:  amLODipine   5 mg Oral BID   apixaban   5 mg Oral BID   budesonide -glycopyrrolate -formoterol   2 puff Inhalation BID   carvedilol   3.125 mg Oral BID WC   cyanocobalamin   1,000 mcg Oral Daily   ferrous sulfate   325 mg Oral QPM   insulin  aspart  0-5 Units Subcutaneous QHS   insulin  aspart  0-9 Units Subcutaneous TID WC   insulin  aspart  7 Units Subcutaneous TID WC   insulin  glargine-yfgn  28 Units Subcutaneous Daily   pantoprazole   40 mg Oral Daily   rosuvastatin   10 mg Oral QHS   sodium bicarbonate   650 mg Oral TID   Continuous Infusions:     LOS: 0 days      Calvin KATHEE Robson, MD [facility]   If 7PM-7AM, please contact night-coverage  12/11/2023, 11:51 AM

## 2023-12-11 NOTE — Progress Notes (Signed)
 Mobility Specialist - Progress Note  Post-mobility: SPO2 98%   12/11/23 1442  Mobility  Activity Ambulated with assistance in hallway  Level of Assistance Standby assist, set-up cues, supervision of patient - no hands on  Assistive Device Front wheel walker  Distance Ambulated (ft) 320 ft  Activity Response Tolerated well  Mobility visit 1 Mobility  Mobility Specialist Start Time (ACUTE ONLY) 1142  Mobility Specialist Stop Time (ACUTE ONLY) 1201  Mobility Specialist Time Calculation (min) (ACUTE ONLY) 19 min   Pt semi fowler upon entry, utilizing RA. Pt agreeable to OOB amb with encouragement. Pt completed bed mob indep, STS to RW from elevated bed height and amb two laps around the NS with supervision--- tolerated well. Pt returned to the room, SOB and fatigue noted upon return EOB. Pt left supine with alarm set and needs within reach.  America Silvan Mobility Specialist 12/11/23 2:50 PM

## 2023-12-11 NOTE — TOC Progression Note (Signed)
 Transition of Care [facility]) - Progression Note    Patient Details  Name: Larry Callahan MRN: [id number] Date of Birth: [birth year]  [facility] CM/SW Contact  Dalia GORMAN Fuse, RN Phone Number: 12/11/2023, 12:07 PM  Clinical Narrative:    TOC received appointment of rep request form from [employer]. The authorized appointment of representative is valid for one year. TOC reviewed the fax request and RN CM Elouise Kleine initiated the appeal on the patient's behalf. TOC explained that because another CM is documented as initiating the request, the patient would likely need to sign the form advising that he gave the referenced TOC permission to initiate the appeal. TOC, Dalia Fuse is not assuming AOR and the information was given to [employer] by [place].   Expected Discharge Plan: Skilled Nursing Facility Barriers to Discharge: No Barriers Identified  Expected Discharge Plan and Services         Expected Discharge Date: 11/29/23                                     Social Determinants of Health (SDOH) Interventions SDOH Screenings   Food Insecurity: No Food Insecurity (11/23/2023)  Housing: High Risk (11/23/2023)  Transportation Needs: No Transportation Needs (11/23/2023)  Utilities: Not At Risk (11/23/2023)  Alcohol Screen: Low Risk  (03/29/2022)  Depression (PHQ2-9): Low Risk  (09/13/2022)  Financial Resource Strain: Low Risk  (11/16/2023)  Social Connections: Unknown (11/23/2023)  Tobacco Use: High Risk (11/22/2023)    Readmission Risk Interventions    12/01/2021   12:25 PM 08/07/2021    2:36 PM 06/28/2021    2:26 PM  Readmission Risk Prevention Plan  Transportation Screening Complete Complete Complete  Medication Review Oceanographer) Complete Complete Complete  PCP or Specialist appointment within 3-5 days of discharge Complete Complete Complete  HRI or Home Care Consult Complete Complete Complete  SW Recovery Care/Counseling  Consult Complete Complete Complete  Palliative Care Screening Complete Complete Not Applicable  Skilled Nursing Facility Complete Complete Not Applicable

## 2023-12-11 NOTE — Plan of Care (Signed)
  Problem: Clinical Measurements: Goal: Will remain free from infection Outcome: Progressing Goal: Diagnostic test results will improve Outcome: Progressing   Problem: Activity: Goal: Risk for activity intolerance will decrease Outcome: Progressing   Problem: Coping: Goal: Level of anxiety will decrease Outcome: Progressing   Problem: Pain Managment: Goal: General experience of comfort will improve and/or be controlled Outcome: Progressing

## 2023-12-12 DIAGNOSIS — R220 Localized swelling, mass and lump, head: Secondary | ICD-10-CM | POA: Diagnosis not present

## 2023-12-12 LAB — GLUCOSE, CAPILLARY
Glucose-Capillary: 47 mg/dL — ABNORMAL LOW (ref 70–99)
Glucose-Capillary: 59 mg/dL — ABNORMAL LOW (ref 70–99)
Glucose-Capillary: 169 mg/dL — ABNORMAL HIGH (ref 70–99)
Glucose-Capillary: 130 mg/dL — ABNORMAL HIGH (ref 70–99)
Glucose-Capillary: 162 mg/dL — ABNORMAL HIGH (ref 70–99)
Glucose-Capillary: 134 mg/dL — ABNORMAL HIGH (ref 70–99)

## 2023-12-12 MED ORDER — FUROSEMIDE 40 MG PO TABS
40.0000 mg | ORAL_TABLET | Freq: Every day | ORAL | Status: DC
  Administered 2023-12-12 – 2023-12-18 (×7): 40 mg via ORAL
  Filled 2023-12-12 (×7): qty 1

## 2023-12-12 MED ORDER — POTASSIUM CHLORIDE CRYS ER 20 MEQ PO TBCR
20.0000 meq | EXTENDED_RELEASE_TABLET | Freq: Every day | ORAL | Status: DC
  Administered 2023-12-13: 20 meq via ORAL
  Filled 2023-12-12: qty 1

## 2023-12-12 MED ORDER — DEXTROSE 50 % IV SOLN: 1.0000 | Freq: Once | INTRAVENOUS | Status: DC

## 2023-12-12 MED ORDER — METOLAZONE 2.5 MG PO TABS
2.5000 mg | ORAL_TABLET | ORAL | Status: DC
  Administered 2023-12-12: 2.5 mg via ORAL
  Filled 2023-12-12: qty 1

## 2023-12-12 MED ORDER — INSULIN GLARGINE-YFGN 100 UNIT/ML ~~LOC~~ SOLN
15.0000 [IU] | Freq: Every day | SUBCUTANEOUS | Status: DC
  Administered 2023-12-12: 15 [IU] via SUBCUTANEOUS
  Filled 2023-12-12 (×2): qty 0.15

## 2023-12-12 NOTE — Progress Notes (Signed)
 PROGRESS NOTE    Larry Callahan  FMW:[id number] DOB: 23-Dec-1955 DOA: 11/22/2023 PCP: Liana Fish, NP    Brief Narrative:    68 year old male with multiple medical problems including: heart failure with reduced EF 40% secondary to nonischemic cardiomyopathy, a flutter status post ablation on Eliquis , CKD stage IV, hypertension, type 2 diabetes, COPD, OSA, HLD, nonobstructive CAD, CVA.  Patient was recently admitted to the hospital and discharged on 6/2.  During that admission he was seen for COPD exacerbation and AKI superimposed on CKD.  He was discharged home in the custody of his nephew with whom he resides.  He then presented to the ED again on 6/4 requesting SNF placement.  He remained in the ED for 4 days pending SNF arrangements.  Ultimately SNF was found to be cost prohibitive and the patient discharged back home to his nephew on 6/8.  He presents again to the ED on this admission requesting long-term placement.  He reports he is no longer safe at home with his nephew but can also not afford skilled nursing facility placement.  ED physician had concerns of tachypnea with expiratory wheeze and requested admission for COPD exacerbation.  Patient's respiratory status appeared to be at baseline and he is medically cleared for discharge.  Stay was then prolonged pending placement.  Insurance company rejected rehab on 6/18 so discharge was canceled.  Looking into other options.   6/23.  Called to see patient secondary to left facial swelling.  Patient with poor dentition.  Will start Solu-Medrol , Rocephin  and Flagyl .     Assessment & Plan:   Principal Problem:   Left facial swelling Active Problems:   Housing situation unstable   Tongue lesion   Chronic systolic CHF (congestive heart failure) (HCC)   Acute kidney injury superimposed on CKD (HCC)   Essential hypertension   Atrial flutter (HCC)   Type II diabetes mellitus with renal manifestations (HCC)   HLD  (hyperlipidemia)   COPD exacerbation (HCC)   CAD (coronary artery disease)   Metabolic acidosis, normal anion gap (NAG)   Iron  deficiency anemia   Hx of completed stroke   Left facial swelling Suspect odontogenic infection.  Facial swelling is improving. CT with soft tissue swelling.  No discernible abscess Plan: Completed 7-day course of Rocephin  and metronidazole .  Completed course on 6/29.  Decadron  IV also discontinued 6/29.  Patient is medically ready for discharge.  Pending safe disposition plan.  Housing situation unstable Rehab was rejected by [employer].   TOC contacted for alternative options TOC following   Tongue lesion Likely aphthous ulcer Continue to monitor   Chronic systolic CHF (congestive heart failure) (HCC) Patient euvolemic.  No diuresis secondary to kidney function.   On room air.  Last EF 50%.  Continue Coreg .     Acute kidney injury superimposed on CKD (HCC) AKI on CKD stage IV.  Baseline creatinine around 3.5.   Up to 4.7.  Diuretic on hold.  Nephrology following.   No plans for dialysis at this time. Kidney function stable   Type II diabetes mellitus with renal manifestations (HCC) Hypoglycemia Patient's elevated blood glucose likely due to steroid administration.  Hypoglycemia noted 7/1.  Likely occurring after discontinuation of intravenous Decadron .  Last hemoglobin A1c less than 7.  Will keep on sensitive sliding scale and CBGs AC and at bedtime.  Relax dietary restrictions to regular.   Atrial flutter (HCC) Status post ablation.  On Eliquis , will continue.  On Coreg .   Essential hypertension Continue  Coreg , Norvasc    COPD exacerbation (HCC) Resolved.  Completed 5-day course of prednisone .   On room air. Oxygen  saturation with vital signs checks   HLD (hyperlipidemia) Continue Crestor    Iron  deficiency anemia Last hemoglobin 8.4 No indication for transfusion Recheck hemoglobin 6/29   Hx of completed stroke Continue  Eliquis     DVT prophylaxis: Eliquis  Code Status: Full Family Communication: None Disposition Plan: Status is: Observation The patient will require care spanning > 2 midnights and should be moved to inpatient because: Unsafe discharge plan.  Medically stable for dc.   Level of care: Telemetry Medical  Consultants:  None  Procedures:  None  Antimicrobials:   Subjective: Seen and examined.  Resting in bed.  No complaints.  Objective: Vitals:   12/11/23 2108 12/12/23 0424 12/12/23 0507 12/12/23 0752  BP: (!) 151/70 (!) 157/70  (!) 171/83  Pulse: 66 63  70  Resp: 17 19  16   Temp: 97.6 F (36.4 C) 97.7 F (36.5 C)  97.6 F (36.4 C)  TempSrc:      SpO2: 99% 99%  100%  Weight:   93.9 kg   Height:        Intake/Output Summary (Last 24 hours) at 12/12/2023 1101 Last data filed at 12/12/2023 0832 Gross per 24 hour  Intake --  Output 2350 ml  Net -2350 ml   Filed Weights   12/10/23 0444 12/11/23 0430 12/12/23 0507  Weight: 92.7 kg 95.6 kg 93.9 kg    Examination:  General exam: No acute distress.  Appears fatigued and disheveled Respiratory system: Lungs clear.  Normal work of breathing.  Room air Cardiovascular system: S1-S2, RRR, no murmurs, no pedal edema Gastrointestinal system: Soft, NT/ND, normal bowel sounds Central nervous system: Alert and oriented. No focal neurological deficits. Extremities: Symmetric 5 x 5 power. Skin: No rashes, lesions or ulcers Psychiatry: Judgement and insight appear normal. Mood & affect appropriate.     Data Reviewed: I have personally reviewed following labs and imaging studies  CBC: Recent Labs  Lab 12/06/23 0935 12/10/23 0339  WBC 13.4* 9.7  NEUTROABS 12.1* 8.6*  HGB 10.2* 9.0*  HCT 31.6* 28.0*  MCV 87.1 87.8  PLT 246 198   Basic Metabolic Panel: Recent Labs  Lab 12/06/23 0935 12/10/23 0339  NA 137 139  K 4.7 5.3*  CL 107 112*  CO2 18* 20*  GLUCOSE 279* 184*  BUN 89* 110*  CREATININE 3.75* 3.45*  CALCIUM   8.4* 7.9*   GFR: Estimated Creatinine Clearance: 23.3 mL/min (A) (by C-G formula based on SCr of 3.45 mg/dL (H)). Liver Function Tests: No results for input(s): AST, ALT, ALKPHOS, BILITOT, PROT, ALBUMIN  in the last 168 hours. No results for input(s): LIPASE, AMYLASE in the last 168 hours. No results for input(s): AMMONIA in the last 168 hours. Coagulation Profile: No results for input(s): INR, PROTIME in the last 168 hours. Cardiac Enzymes: No results for input(s): CKTOTAL, CKMB, CKMBINDEX, TROPONINI in the last 168 hours. BNP (last 3 results) No results for input(s): PROBNP in the last 8760 hours. HbA1C: No results for input(s): HGBA1C in the last 72 hours. CBG: Recent Labs  Lab 12/11/23 1647 12/11/23 2110 12/12/23 0753 12/12/23 0812 12/12/23 0945  GLUCAP 190* 114* 47* 59* 169*   Lipid Profile: No results for input(s): CHOL, HDL, LDLCALC, TRIG, CHOLHDL, LDLDIRECT in the last 72 hours. Thyroid  Function Tests: No results for input(s): TSH, T4TOTAL, FREET4, T3FREE, THYROIDAB in the last 72 hours. Anemia Panel: No results for input(s): VITAMINB12, FOLATE, FERRITIN,  TIBC, IRON , RETICCTPCT in the last 72 hours. Sepsis Labs: Recent Labs  Lab 12/06/23 0935  PROCALCITON <0.10    No results found for this or any previous visit (from the past 240 hours).       Radiology Studies: No results found.       Scheduled Meds:  amLODipine   5 mg Oral BID   apixaban   5 mg Oral BID   budesonide -glycopyrrolate -formoterol   2 puff Inhalation BID   carvedilol   3.125 mg Oral BID WC   cyanocobalamin   1,000 mcg Oral Daily   dextrose   1 ampule Intravenous Once   ferrous sulfate   325 mg Oral QPM   furosemide   40 mg Oral Daily   insulin  aspart  0-5 Units Subcutaneous QHS   insulin  aspart  0-9 Units Subcutaneous TID WC   insulin  glargine-yfgn  15 Units Subcutaneous Daily   metolazone   2.5 mg Oral Weekly    pantoprazole   40 mg Oral Daily   [START ON 12/13/2023] potassium chloride  SA  20 mEq Oral Daily   rosuvastatin   10 mg Oral QHS   sodium bicarbonate   650 mg Oral TID   Continuous Infusions:     LOS: 0 days      Larry KATHEE Robson, MD [facility]   If 7PM-7AM, please contact night-coverage  12/12/2023, 11:01 AM

## 2023-12-12 NOTE — Plan of Care (Signed)

## 2023-12-13 DIAGNOSIS — R220 Localized swelling, mass and lump, head: Secondary | ICD-10-CM | POA: Diagnosis not present

## 2023-12-13 DIAGNOSIS — J441 Chronic obstructive pulmonary disease with (acute) exacerbation: Secondary | ICD-10-CM | POA: Diagnosis not present

## 2023-12-13 LAB — COMPREHENSIVE METABOLIC PANEL WITH GFR
ALT: 15 U/L (ref 0–44)
AST: 14 U/L — ABNORMAL LOW (ref 15–41)
Albumin: 2.1 g/dL — ABNORMAL LOW (ref 3.5–5.0)
Alkaline Phosphatase: 53 U/L (ref 38–126)
Anion gap: 9 (ref 5–15)
BUN: 99 mg/dL — ABNORMAL HIGH (ref 8–23)
CO2: 20 mmol/L — ABNORMAL LOW (ref 22–32)
Calcium: 7.8 mg/dL — ABNORMAL LOW (ref 8.9–10.3)
Chloride: 109 mmol/L (ref 98–111)
Creatinine, Ser: 3.05 mg/dL — ABNORMAL HIGH (ref 0.61–1.24)
GFR, Estimated: 21 mL/min — ABNORMAL LOW (ref >60)
Glucose, Bld: 168 mg/dL — ABNORMAL HIGH (ref 70–99)
Potassium: 4.8 mmol/L (ref 3.5–5.1)
Sodium: 138 mmol/L (ref 135–145)
Total Bilirubin: 0.4 mg/dL (ref 0.0–1.2)
Total Protein: 4.9 g/dL — ABNORMAL LOW (ref 6.5–8.1)

## 2023-12-13 LAB — CBC
HCT: 29.8 % — ABNORMAL LOW (ref 39.0–52.0)
Hemoglobin: 9.6 g/dL — ABNORMAL LOW (ref 13.0–17.0)
MCH: 28.3 pg (ref 26.0–34.0)
MCHC: 32.2 g/dL (ref 30.0–36.0)
MCV: 87.9 fL (ref 80.0–100.0)
Platelets: 143 10*3/uL — ABNORMAL LOW (ref 150–400)
RBC: 3.39 MIL/uL — ABNORMAL LOW (ref 4.22–5.81)
RDW: 14.2 % (ref 11.5–15.5)
WBC: 8.7 10*3/uL (ref 4.0–10.5)
nRBC: 0 % (ref 0.0–0.2)

## 2023-12-13 LAB — GLUCOSE, CAPILLARY
Glucose-Capillary: 104 mg/dL — ABNORMAL HIGH (ref 70–99)
Glucose-Capillary: 44 mg/dL — CL (ref 70–99)
Glucose-Capillary: 218 mg/dL — ABNORMAL HIGH (ref 70–99)
Glucose-Capillary: 202 mg/dL — ABNORMAL HIGH (ref 70–99)
Glucose-Capillary: 151 mg/dL — ABNORMAL HIGH (ref 70–99)

## 2023-12-13 MED ORDER — INSULIN ASPART 100 UNIT/ML IJ SOLN: 0.0000 [IU] | Freq: Three times a day (TID) | INTRAMUSCULAR | Status: DC

## 2023-12-13 MED ORDER — DEXTROSE 50 % IV SOLN: 1.0000 | INTRAVENOUS | Status: DC | PRN

## 2023-12-13 MED ORDER — AMLODIPINE BESYLATE 10 MG PO TABS
10.0000 mg | ORAL_TABLET | Freq: Every day | ORAL | Status: DC
  Administered 2023-12-14: 10 mg via ORAL
  Filled 2023-12-13: qty 1

## 2023-12-13 NOTE — Progress Notes (Addendum)
 Mobility Specialist - Progress Note  During mobility: HR 116, SpO2 97% Post-mobility: HR 120, SPO2 99%   12/13/23 1649  Mobility  Activity Ambulated with assistance in hallway  Level of Assistance Standby assist, set-up cues, supervision of patient - no hands on  Assistive Device Front wheel walker  Distance Ambulated (ft) 320 ft  Activity Response Tolerated well  Mobility visit 1 Mobility  Mobility Specialist Start Time (ACUTE ONLY) 1617  Mobility Specialist Stop Time (ACUTE ONLY) 1640  Mobility Specialist Time Calculation (min) (ACUTE ONLY) 23 min   Pt sitting EOB upon entry, utilizing RA. Pt STS to RW CGA-MinG with cuing for proper hand placement when coming to standing.  Pt amb two laps around the NS with supervision-- cuing for upright posture during amb. Pt expressed feeling SOB ~140 ft into amb, however continues with amb--- O2 97%. Pt returned to the room, left supine with alarm set and needs within reach.  America Silvan Mobility Specialist 12/13/23 4:53 PM

## 2023-12-13 NOTE — Progress Notes (Addendum)
 PROGRESS NOTE  Larry Callahan  FMW:[id number] DOB: Mar 03, 1956 DOA: 11/22/2023 PCP: Liana Fish, NP   Brief Narrative:  Larry Callahan is a 68 year old male with multiple medical problems including: heart failure with reduced EF 40% secondary to nonischemic cardiomyopathy, a flutter status post ablation on Eliquis , CKD stage IV, hypertension, type 2 diabetes, COPD, OSA, HLD, nonobstructive CAD, CVA. Patient was recently admitted to the hospital and discharged on 6/2.  During that admission he was seen for COPD exacerbation and AKI superimposed on CKD.  He was discharged home in the custody of his nephew with whom he resides.  He then presented to the ED again on 6/4 requesting SNF placement.  He remained in the ED for 4 days pending SNF arrangements.  Ultimately SNF was found to be cost prohibitive and the patient discharged back home to his nephew on 6/8.  He presents again to the ED on this admission requesting long-term placement.  He reports he is no longer safe at home with his nephew but can also not afford skilled nursing facility placement.  ED physician had concerns of tachypnea with expiratory wheeze and requested admission for COPD exacerbation.  Patient's respiratory status appeared to be at baseline and he is medically cleared for discharge.  6/23- concern for odontic infection. Completed antibiotic and steroid course. No abscess identified on CT.  Discharge is pending placement. TOC is following.    Assessment & Plan:   Principal Problem:   Left facial swelling Active Problems:   Housing situation unstable   Tongue lesion   Chronic systolic CHF (congestive heart failure) (HCC)   Acute kidney injury superimposed on CKD (HCC)   Essential hypertension   Atrial flutter (HCC)   Type II diabetes mellitus with renal manifestations (HCC)   HLD (hyperlipidemia)   COPD exacerbation (HCC)   CAD (coronary artery disease)   Metabolic acidosis, normal anion gap (NAG)    Iron  deficiency anemia   Hx of completed stroke   Left facial swelling- resolved Suspect odontogenic infection.  Facial swelling is improving. CT with soft tissue swelling.  No discernible abscess Completed 7-day course of Rocephin  and metronidazole .  Completed course on 6/29.  Decadron  IV also discontinued 6/29.  Patient is medically ready for discharge.  Pending safe disposition plan.  Hyperkalemia- resolved to normal limits today - monitor periodically  Housing situation unstable Rehab was rejected by insurance company.   TOC contacted for alternative options TOC following   Tongue lesion Likely aphthous ulcer Continue to monitor   Chronic systolic CHF (congestive heart failure) (HCC) Patient euvolemic.  No diuresis secondary to kidney function.   On room air.  Last EF 50%.  Continue Coreg .     AKI superimposed on CKD IV- resolved. Cr 3 today. Evaluated by nephrology who signed off. Kidney function stable   Type II diabetes mellitus with renal manifestations (HCC) Hypoglycemia- hgb A1c 6.7 on 09/18/23 Patient's elevated blood glucose likely due to steroid administration. Now that steroids have been discontinued, had 2 mornings of asymptomatic hypoglycemia.  - stopping insulin  and will continue to monitor CBGs.  -  consider initiating oral agents   Atrial flutter  HTN- elevated blood pressures.  Status post ablation.   - On Eliquis , will continue.  Continue Coreg  - amlodipine  changed to 10mg  daily - continue lasix  - consider adding additional agent for better control    COPD exacerbation (HCC)- Resolved.  Completed 5-day course of prednisone .   On room air. Oxygen  saturation with vital signs checks  Iron  deficiency anemia Last hemoglobin 9.6 No indication for transfusion Recheck hemoglobin 6/29   Hx of completed stroke  HLD Continue Eliquis  Continue Crestor   DVT prophylaxis: Eliquis  Code Status: Full Family Communication: None Disposition Plan: Status is:  Observation The patient will require care spanning > 2 midnights and should be moved to inpatient because: Unsafe discharge plan.  Medically stable for dc.  Level of care: Telemetry Medical  Consultants:  Nephrology   Procedures:  None  Subjective: Pt reports doing well today. Has no complaints. He doesn't feel up to getting out of bed today but with encouragement he does agree to try later today.   Objective: Vitals:   12/12/23 1950 12/13/23 0430 12/13/23 0500 12/13/23 0730  BP: (!) 165/74 (!) 151/78  (!) 163/82  Pulse: 75 83  69  Resp: 16 18  16   Temp: 98 F (36.7 C) 98 F (36.7 C)  97.8 F (36.6 C)  TempSrc:      SpO2: 100% 98%  99%  Weight:   94.7 kg   Height:        Intake/Output Summary (Last 24 hours) at 12/13/2023 0803 Last data filed at 12/13/2023 0520 Gross per 24 hour  Intake 240 ml  Output 1950 ml  Net -1710 ml   Filed Weights   12/11/23 0430 12/12/23 0507 12/13/23 0500  Weight: 95.6 kg 93.9 kg 94.7 kg    Examination:  General exam: No acute distress.  Respiratory system: Lungs clear.  Normal work of breathing.  Room air Cardiovascular system: S1-S2, RRR, no murmurs, no pedal edema Central nervous system: Alert and oriented. No focal neurological deficits. Extremities: Symmetric 5 x 5 power. Skin: No rashes, lesions or ulcers Psychiatry: Judgement and insight appear normal. Mood & affect appropriate.   Data Reviewed: I have personally reviewed following labs and imaging studies  CBC: Recent Labs  Lab 12/06/23 0935 12/10/23 0339  WBC 13.4* 9.7  NEUTROABS 12.1* 8.6*  HGB 10.2* 9.0*  HCT 31.6* 28.0*  MCV 87.1 87.8  PLT 246 198   Basic Metabolic Panel: Recent Labs  Lab 12/06/23 0935 12/10/23 0339  NA 137 139  K 4.7 5.3*  CL 107 112*  CO2 18* 20*  GLUCOSE 279* 184*  BUN 89* 110*  CREATININE 3.75* 3.45*  CALCIUM  8.4* 7.9*   CBG: Recent Labs  Lab 12/12/23 0945 12/12/23 1128 12/12/23 1658 12/12/23 1951 12/13/23 0735  GLUCAP 169*  130* 162* 134* 44*     LOS: 0 days    Marien LITTIE Piety, MD [facility]   If 7PM-7AM, please contact night-coverage  12/13/2023, 8:03 AM

## 2023-12-13 NOTE — Progress Notes (Signed)
 Hypoglycemic Event  CBG: 44  Treatment: 8 oz juice/soda  Symptoms: None alert and not symptomatic  Follow-up CBG: Time:0819 CBG Result:104  Possible Reasons for Event: Unknown  Comments/MD notified:Patient currently eating breakfast    Tiannah Greenly Taylor Juell Radney

## 2023-12-13 NOTE — Progress Notes (Signed)
 PT Cancellation Note  Patient Details Name: Nikita Humble MRN: [id number] DOB: Dec 03, 1955   Cancelled Treatment:    Reason Eval/Treat Not Completed: Other (comment) Chart review complete, upon entering room pt immediately states he will not be working with PT, states he feels dizzy and does not want to move. PT told pt they would return later, time permitting. Will follow up as able.   Leonidus Rowand Romero-Perozo, SPT  12/13/2023, 11:42 AM

## 2023-12-14 ENCOUNTER — Telehealth (HOSPITAL_COMMUNITY): Payer: Self-pay | Admitting: Pharmacy Technician

## 2023-12-14 ENCOUNTER — Other Ambulatory Visit (HOSPITAL_COMMUNITY): Payer: Self-pay

## 2023-12-14 DIAGNOSIS — J441 Chronic obstructive pulmonary disease with (acute) exacerbation: Secondary | ICD-10-CM | POA: Diagnosis not present

## 2023-12-14 DIAGNOSIS — R220 Localized swelling, mass and lump, head: Secondary | ICD-10-CM | POA: Diagnosis not present

## 2023-12-14 LAB — GLUCOSE, CAPILLARY
Glucose-Capillary: 89 mg/dL (ref 70–99)
Glucose-Capillary: 260 mg/dL — ABNORMAL HIGH (ref 70–99)
Glucose-Capillary: 217 mg/dL — ABNORMAL HIGH (ref 70–99)
Glucose-Capillary: 148 mg/dL — ABNORMAL HIGH (ref 70–99)

## 2023-12-14 MED ORDER — IRBESARTAN 150 MG PO TABS
75.0000 mg | ORAL_TABLET | Freq: Every day | ORAL | Status: DC
  Administered 2023-12-14 – 2023-12-18 (×5): 75 mg via ORAL
  Filled 2023-12-14 (×5): qty 1

## 2023-12-14 MED ORDER — EMPAGLIFLOZIN 10 MG PO TABS
10.0000 mg | ORAL_TABLET | Freq: Every day | ORAL | Status: DC
  Administered 2023-12-14 – 2023-12-18 (×5): 10 mg via ORAL
  Filled 2023-12-14 (×5): qty 1

## 2023-12-14 MED ORDER — FERROUS SULFATE 325 (65 FE) MG PO TABS
325.0000 mg | ORAL_TABLET | ORAL | Status: DC
  Administered 2023-12-15 – 2023-12-17 (×2): 325 mg via ORAL
  Filled 2023-12-14 (×2): qty 1

## 2023-12-14 NOTE — Progress Notes (Signed)
 Physical Therapy Treatment Patient Details Name: Larry Callahan MRN: [id number] DOB: Jun 22, 1955 Today's Date: 12/14/2023   History of Present Illness Larry Callahan is a 68 year old male with history of CHF, a flutter on Eliquis , CKD, T2DM, and CAD who presenting to the ER for housing concerns. MD diagnoses includes COPD, CHF, HTN, atrial flutter, type II diabetes, HLD, and CAD.    PT Comments  Patient sleeping, but wakes to voice. Self directive of care. Denied OOB to recliner at end of session, requested to walk one loop in hallway only, but then was frustrated about not going more, but did not turn around to complete more ambulation when offered. modI for bed mobility. Sit <> stand with RW and supervision, as well supervision for toileting and hand washing (one small instance of needing at least SUE support when stepping backwards from sink). Pt appears to be at new mobility baseline, and has been successfully ambulating with mobility team and nursing staff as able. PT to sign off, care team updated of patient progress and status.     If plan is discharge home, recommend the following: Assist for transportation;Help with stairs or ramp for entrance;Assistance with cooking/housework   Can travel by private vehicle     Yes  Equipment Recommendations   (RW or BSC as needed if unavailable to patient)    Recommendations for Other Services       Precautions / Restrictions Precautions Precautions: Fall Recall of Precautions/Restrictions: Impaired Restrictions Weight Bearing Restrictions Per Provider Order: No     Mobility  Bed Mobility Overal bed mobility: Modified Independent             General bed mobility comments: extra time, use of bed rails    Transfers Overall transfer level: Needs assistance Equipment used: Rolling walker (2 wheels) Transfers: Sit to/from Stand Sit to Stand: Supervision, From elevated surface           General transfer comment:  pt demands elevated surface, able to stand without physical assistance    Ambulation/Gait Ambulation/Gait assistance: Supervision Gait Distance (Feet): 200 Feet Assistive device: Rolling walker (2 wheels)         General Gait Details: pt reported wanting to walk only one lap after exiting room, PT agreeable. but then returning to room said he wanted to go more, but did not turn around   [employer] Mobility     Tilt Bed    Modified Rankin (Stroke Patients Only)       Balance Overall balance assessment: Needs assistance Sitting-balance support: Feet supported Sitting balance-Leahy Scale: Normal     Standing balance support: Bilateral upper extremity supported, During functional activity, Single extremity supported, No upper extremity supported Standing balance-Leahy Scale: Good Standing balance comment: able to urinate in standing, wash hands, did need SUE to ensure steadiness at one point                            Communication    Cognition Arousal: Alert Behavior During Therapy: [facility] for tasks assessed/performed   PT - Cognitive impairments: No family/caregiver present to determine baseline                       PT - Cognition Comments: frustrated with staff throughout, self directive of care Following commands: Intact      Cueing    Exercises  General Comments        Pertinent Vitals/Pain Pain Assessment Pain Assessment: No/denies pain    Home Living                          Prior Function            PT Goals (current goals can now be found in the care plan section) Acute Rehab PT Goals PT Goal Formulation: All assessment and education complete, DC therapy    Frequency           PT Plan      Co-evaluation              AM-PAC PT 6 Clicks Mobility   Outcome Measure  Help needed turning from your back to your side while in a flat bed without using bedrails?:  None Help needed moving from lying on your back to sitting on the side of a flat bed without using bedrails?: None Help needed moving to and from a bed to a chair (including a wheelchair)?: None Help needed standing up from a chair using your arms (e.g., wheelchair or bedside chair)?: None Help needed to walk in hospital room?: None Help needed climbing 3-5 steps with a railing? : A Little 6 Click Score: 23    End of Session   Activity Tolerance: Patient tolerated treatment well Patient left:  (seated EOB, refuses recliner) Nurse Communication: Mobility status PT Visit Diagnosis: Repeated falls (R29.6);History of falling (Z91.81);Unsteadiness on feet (R26.81);Muscle weakness (generalized) (M62.81)     Time: 8986-8974 PT Time Calculation (min) (ACUTE ONLY): 12 min  Charges:    $Therapeutic Activity: 8-22 mins PT General Charges $$ ACUTE PT VISIT: 1 Visit                     Doyal Shams PT, DPT 11:05 AM,12/14/23

## 2023-12-14 NOTE — Progress Notes (Signed)
 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 Patient was recently admitted to the hospital and discharged on 6/2.  During that admission he was seen for COPD exacerbation and AKI superimposed on CKD.  He was discharged home in the custody of his nephew with whom he resides.  He then presented to the ED again on 6/4 requesting SNF placement.  He remained in the ED for 4 days pending SNF arrangements.  Ultimately SNF was found to be cost prohibitive and the patient discharged back home to his nephew on 6/8.   He presents again to the ED on this admission requesting long-term placement.  He reports he is no longer safe at home with his nephew but can also not afford skilled nursing facility placement.  ED physician had concerns of tachypnea with expiratory wheeze and requested admission for COPD exacerbation.  Patient's respiratory status appeared to be at baseline and he is medically cleared for discharge.  6/23- concern for odontic infection. Completed antibiotic and steroid course. No abscess identified on CT.  He is medically clear for dc. Discharge is pending placement. TOC is following.   Assessment & Plan:   Principal Problem:   Left facial swelling Active Problems:   Housing situation unstable   Tongue lesion   Chronic systolic CHF (congestive heart failure) (HCC)   Acute kidney injury superimposed on CKD (HCC)   Essential hypertension   Atrial flutter (HCC)   Type II diabetes mellitus with renal manifestations (HCC)   HLD (hyperlipidemia)   COPD exacerbation (HCC)   CAD (coronary artery disease)   Metabolic acidosis, normal anion gap (NAG)   Iron  deficiency anemia   Hx  of completed stroke   Left facial swelling- resolved Suspect odontogenic infection.  Facial swelling is improving. CT with soft tissue swelling.  No discernible abscess Completed 7-day course of Rocephin  and metronidazole .  Completed course on 6/29.  Decadron  IV also discontinued 6/29.  Patient is medically ready for discharge.  Pending safe disposition plan.  Hyperkalemia- resolved to normal limits today - monitor tomorrow since starting on ARB 7/3   Chronic systolic HFrEF- ORA. Patient euvolemic.  - on daily lasix  and weekly metolazone . Tolerating well.  - Continue Coreg .   - starting on jardiance 7/3. May titrate up dose as tolerated.    AKI superimposed on CKD IV- resolved to baseline. Cr 3 today. Evaluated by nephrology who signed off. Kidney function stable - starting patient on ARB 7/3. May expect transient increase in Cr with starting of ARB medication but patient will have overall protective quality with use and he needs improved BP control.    Type II diabetes mellitus with renal manifestations (HCC) Recurrent hypoglycemia- hgb A1c 6.7 on 09/18/23 Patient's previous elevated blood glucose likely due to steroid administration. Now that steroids have been discontinued, had 2 mornings of asymptomatic hypoglycemia.  - stopping insulin  and will continue to monitor CBGs. Fasting CBG this morning is 89 since discontinuing his insulin  yesterday. Will continue to monitor off of insulin . - starting jardiance 7/3. Can titrate up dose if tolerating well - consider starting metformin  at low dose with caution to cause GI distress   Atrial flutter  HTN- elevated blood pressures. Status post ablation has good  rate control.   - On Eliquis , will continue.  - Continue Coreg  - amlodipine  discontinued as there is no secondary benefit and not well controlled.  - starting irbesartan at low dose and should be titrated up over the next few days if tolerating to obtain better BP control.  - continue  lasix   Housing situation unstable Rehab was rejected by [employer].   TOC contacted for alternative options TOC following- meeting with possible LTC option 7/3   Tongue lesion Likely aphthous ulcer Continue to monitor   COPD exacerbation (HCC)- Resolved.  Completed 5-day course of prednisone .   On room air. Oxygen  saturation with vital signs checks   Iron  deficiency anemia Last hemoglobin 9.6 No indication for transfusion - every other day iron  supplement. Take with vitamin C  containing foods for better absorption    Hx of completed stroke  HLD Continue Eliquis  Continue Crestor   DVT prophylaxis: Eliquis  Code Status: Full Family Communication: None Disposition Plan: Status is: Observation The patient will require care spanning > 2 midnights and should be moved to inpatient because: Unsafe discharge plan.  Medically stable for dc.  Level of care: Telemetry Medical  Consultants:  Nephrology   Procedures:  None  Subjective: Pt reports doing well today. Has no complaints. He is doing very well working with PT today.  Objective: Vitals:   12/13/23 2017 12/14/23 0436 12/14/23 0500 12/14/23 0752  BP: (!) 149/66 (!) 173/89  (!) 161/77  Pulse: 85 86  75  Resp: 16 16  16   Temp: 98.9 F (37.2 C) 98 F (36.7 C)  98.8 F (37.1 C)  TempSrc:    Oral  SpO2: 99% 100%  100%  Weight:   91 kg   Height:        Intake/Output Summary (Last 24 hours) at 12/14/2023 0806 Last data filed at 12/14/2023 0754 Gross per 24 hour  Intake 240 ml  Output 1125 ml  Net -885 ml   Filed Weights   12/12/23 0507 12/13/23 0500 12/14/23 0500  Weight: 93.9 kg 94.7 kg 91 kg    Examination: General exam: No acute distress.  Respiratory system: Lungs clear.  Normal work of breathing.  Room air Cardiovascular system: S1-S2, RRR, no murmurs, no pedal edema Central nervous system: Alert and oriented. No focal neurological deficits. Extremities: Symmetric 5 x 5 power. Skin: No rashes,  lesions or ulcers Psychiatry: Judgement and insight appear normal. Mood & affect appropriate.   Data Reviewed: I have personally reviewed following labs and imaging studies  CBC: Recent Labs  Lab 12/10/23 0339 12/13/23 0928  WBC 9.7 8.7  NEUTROABS 8.6*  --   HGB 9.0* 9.6*  HCT 28.0* 29.8*  MCV 87.8 87.9  PLT 198 143*   Basic Metabolic Panel: Recent Labs  Lab 12/10/23 0339 12/13/23 0928  NA 139 138  K 5.3* 4.8  CL 112* 109  CO2 20* 20*  GLUCOSE 184* 168*  BUN 110* 99*  CREATININE 3.45* 3.05*  CALCIUM  7.9* 7.8*   CBG: Recent Labs  Lab 12/13/23 0819 12/13/23 1128 12/13/23 1611 12/13/23 2021 12/14/23 0748  GLUCAP 104* 218* 202* 151* 89     LOS: 0 days    Marien LITTIE Piety, MD [facility]   If 7PM-7AM, please contact night-coverage  12/14/2023, 8:06 AM

## 2023-12-14 NOTE — Telephone Encounter (Signed)
 Patient Product/process development scientist completed.    The patient is insured through [place]. Patient has Medicare and is not eligible for a copay card, but may be able to apply for patient assistance or Medicare RX Payment Plan (Patient Must reach out to their plan, if eligible for payment plan), if available.    Ran test claim for Jardiance 10 mg and the current 30 day co-pay is $0.00.   This test claim was processed through [facility]- copay amounts may vary at other pharmacies due to pharmacy/plan contracts, or as the patient moves through the different stages of their insurance plan.     Morgan Arab, CPHT Pharmacy Technician III Certified Patient Advocate [facility] Patient Advocate Team Direct Number: [phone]  Fax: [phone]

## 2023-12-14 NOTE — TOC Progression Note (Signed)
 Transition of Care [facility]) - Progression Note    Patient Details  Name: Rolly Magri MRN: [id number] Date of Birth: 13-May-1956  Transition of [facility] CM/SW Contact  Lorraine LILLETTE Fenton, [place] Phone Number: 12/14/2023, 2:58 PM  Clinical Narrative:     CSW spoke with pt 7/2- he stated that he does not want to return to nephews home, nor does nephew wish he return.  He states he was not harmed or hurt while there- but is expected to so do more than he can.  Examples when asked to clarify- nephew thinks he should cook and clean more than he is able.  CSW asked if there are other friends or relatives as a placement resource, pt said no.  Their is a niece in [place]  but unable to care for him.  7/3 CSW and RN [facility] supervisor met with pt at bedside to discuss DC plan.  Pt said he has no other resources and when he is having medical condition flares up he is unable to care for himself independently and needs help.  Discussed his financial info briefly, reviewed with pt if he needs LTC his income would be reduced to cover the cost if his care needs and he agreed he understood and needs a placement.   . TOC to meet with [facility] at 4:30 7/3 to request placement for pt.  TOC following  Expected Discharge Plan: Skilled Nursing Facility Barriers to Discharge: No Barriers Identified  Expected Discharge Plan and Services         Expected Discharge Date: 11/29/23                                     Social Determinants of Health (SDOH) Interventions SDOH Screenings   Food Insecurity: No Food Insecurity (11/23/2023)  Housing: High Risk (11/23/2023)  Transportation Needs: No Transportation Needs (11/23/2023)  Utilities: Not At Risk (11/23/2023)  Alcohol Screen: Low Risk  (03/29/2022)  Depression (PHQ2-9): Low Risk  (09/13/2022)  Financial Resource Strain: Low Risk  (11/16/2023)  Social Connections: Unknown (11/23/2023)  Tobacco Use: High Risk (11/22/2023)    Readmission Risk  Interventions    12/01/2021   12:25 PM 08/07/2021    2:36 PM 06/28/2021    2:26 PM  Readmission Risk Prevention Plan  Transportation Screening Complete Complete Complete  Medication Review Oceanographer) Complete Complete Complete  PCP or Specialist appointment within 3-5 days of discharge Complete Complete Complete  HRI or Home Care Consult Complete Complete Complete  SW Recovery Care/Counseling Consult Complete Complete Complete  Palliative Care Screening Complete Complete Not Applicable  Skilled Nursing Facility Complete Complete Not Applicable

## 2023-12-14 NOTE — Progress Notes (Signed)
 Mobility Specialist - Progress Note   12/14/23 1146  Mobility  Activity Ambulated with assistance in hallway;Dangled on edge of bed  Level of Assistance Standby assist, set-up cues, supervision of patient - no hands on  Assistive Device Front wheel walker  Distance Ambulated (ft) 160 ft  Range of Motion/Exercises Active;Right leg;Left leg  Activity Response Tolerated well  Mobility visit 1 Mobility  Mobility Specialist Start Time (ACUTE ONLY) 1112  Mobility Specialist Stop Time (ACUTE ONLY) 1132  Mobility Specialist Time Calculation (min) (ACUTE ONLY) 20 min   Pt semi fowler upon entry, utilizing RA. Pt agreeable to activity this date. Pt completed bed mob ModI, dangled EOB and completed the following exercises indep: seated marches, straight leg kicks and ankle pumps. Pt requesting to amb within the hallway once finished. Pt STS to RW MinG and amb one lap around the NS with sup. Pt returned to the room, left semi fowler with alarm set and needs within reach.  America Silvan Mobility Specialist 12/14/23 2:29 PM

## 2023-12-14 NOTE — TOC Progression Note (Signed)
 Transition of Care [facility]) - Progression Note    Patient Details  Name: Tanor Glaspy MRN: [id number] Date of Birth: [birth year]  Transition of Care [facility]) CM/SW Contact  Delphine KANDICE Bring, RN Phone Number: 12/14/2023, 4:43 PM  Clinical Narrative:    CM called the facility to speak with [facility] admission administrator but no answer. CM sent [facility] an email to see if they can accept patient. CM waiting for response.   Expected Discharge Plan: Skilled Nursing Facility Barriers to Discharge: No Barriers Identified  Expected Discharge Plan and Services         Expected Discharge Date: 11/29/23                                     Social Determinants of Health (SDOH) Interventions SDOH Screenings   Food Insecurity: No Food Insecurity (11/23/2023)  Housing: High Risk (11/23/2023)  Transportation Needs: No Transportation Needs (11/23/2023)  Utilities: Not At Risk (11/23/2023)  Alcohol Screen: Low Risk  (03/29/2022)  Depression (PHQ2-9): Low Risk  (09/13/2022)  Financial Resource Strain: Low Risk  (11/16/2023)  Social Connections: Unknown (11/23/2023)  Tobacco Use: High Risk (11/22/2023)    Readmission Risk Interventions    12/01/2021   12:25 PM 08/07/2021    2:36 PM 06/28/2021    2:26 PM  Readmission Risk Prevention Plan  Transportation Screening Complete Complete Complete  Medication Review Oceanographer) Complete Complete Complete  PCP or Specialist appointment within 3-5 days of discharge Complete Complete Complete  HRI or Home Care Consult Complete Complete Complete  SW Recovery Care/Counseling Consult Complete Complete Complete  Palliative Care Screening Complete Complete Not Applicable  Skilled Nursing Facility Complete Complete Not Applicable

## 2023-12-14 NOTE — Plan of Care (Signed)
  Problem: Clinical Measurements: Goal: Ability to maintain clinical measurements within normal limits will improve Outcome: Progressing   Problem: Pain Managment: Goal: General experience of comfort will improve and/or be controlled Outcome: Progressing   Problem: Safety: Goal: Ability to remain free from injury will improve Outcome: Progressing

## 2023-12-15 DIAGNOSIS — R220 Localized swelling, mass and lump, head: Secondary | ICD-10-CM | POA: Diagnosis not present

## 2023-12-15 LAB — CBC
HCT: 27.6 % — ABNORMAL LOW (ref 39.0–52.0)
Hemoglobin: 8.8 g/dL — ABNORMAL LOW (ref 13.0–17.0)
MCH: 28.1 pg (ref 26.0–34.0)
MCHC: 31.9 g/dL (ref 30.0–36.0)
MCV: 88.2 fL (ref 80.0–100.0)
Platelets: 106 K/uL — ABNORMAL LOW (ref 150–400)
RBC: 3.13 MIL/uL — ABNORMAL LOW (ref 4.22–5.81)
RDW: 14.1 % (ref 11.5–15.5)
WBC: 6.4 K/uL (ref 4.0–10.5)
nRBC: 0 % (ref 0.0–0.2)

## 2023-12-15 LAB — BASIC METABOLIC PANEL WITH GFR
Anion gap: 11 (ref 5–15)
BUN: 91 mg/dL — ABNORMAL HIGH (ref 8–23)
CO2: 23 mmol/L (ref 22–32)
Calcium: 7.9 mg/dL — ABNORMAL LOW (ref 8.9–10.3)
Chloride: 108 mmol/L (ref 98–111)
Creatinine, Ser: 2.87 mg/dL — ABNORMAL HIGH (ref 0.61–1.24)
GFR, Estimated: 23 mL/min — ABNORMAL LOW (ref >60)
Glucose, Bld: 135 mg/dL — ABNORMAL HIGH (ref 70–99)
Potassium: 4.4 mmol/L (ref 3.5–5.1)
Sodium: 142 mmol/L (ref 135–145)

## 2023-12-15 LAB — GLUCOSE, CAPILLARY
Glucose-Capillary: 119 mg/dL — ABNORMAL HIGH (ref 70–99)
Glucose-Capillary: 243 mg/dL — ABNORMAL HIGH (ref 70–99)
Glucose-Capillary: 157 mg/dL — ABNORMAL HIGH (ref 70–99)
Glucose-Capillary: 221 mg/dL — ABNORMAL HIGH (ref 70–99)

## 2023-12-15 NOTE — Plan of Care (Signed)
  Problem: Education: Goal: Knowledge of General Education information will improve Description: Including pain rating scale, medication(s)/side effects and non-pharmacologic comfort measures Outcome: Progressing   Problem: Clinical Measurements: Goal: Will remain free from infection Outcome: Progressing Goal: Respiratory complications will improve Outcome: Progressing Goal: Cardiovascular complication will be avoided Outcome: Progressing   Problem: Nutrition: Goal: Adequate nutrition will be maintained Outcome: Progressing   Problem: Safety: Goal: Ability to remain free from injury will improve Outcome: Progressing

## 2023-12-15 NOTE — TOC Progression Note (Signed)
 Transition of Care [facility]) - Progression Note    Patient Details  Name: Larry Callahan MRN: [id number] Date of Birth: 03/25/1956  Transition of Care [facility]) CM/SW Contact  Elouise LULLA Capri, RN 12/15/2023, 12:08 PM  Clinical Narrative:      Per secure email from  CM Supervisor, Delphine, patient pending possible discharge to [facility] on Monday, 12/18/2023 pending approval of SNF contract with [facility]. CM to patient's room regarding possible discharge to [facility], patient verbalized understanding. CM alert to Dr. Roann.   Expected Discharge Plan: Skilled Nursing Facility Barriers to Discharge: No Barriers Identified  Expected Discharge Plan and Services    SNF LTC, [facility] contract     Expected Discharge Date: 11/29/23                  Social Determinants of Health (SDOH) Interventions SDOH Screenings   Food Insecurity: No Food Insecurity (11/23/2023)  Housing: High Risk (11/23/2023)  Transportation Needs: No Transportation Needs (11/23/2023)  Utilities: Not At Risk (11/23/2023)  Alcohol Screen: Low Risk  (03/29/2022)  Depression (PHQ2-9): Low Risk  (09/13/2022)  Financial Resource Strain: Low Risk  (11/16/2023)  Social Connections: Unknown (11/23/2023)  Tobacco Use: High Risk (11/22/2023)    Readmission Risk Interventions    12/01/2021   12:25 PM 08/07/2021    2:36 PM 06/28/2021    2:26 PM  Readmission Risk Prevention Plan  Transportation Screening Complete Complete Complete  Medication Review Oceanographer) Complete Complete Complete  PCP or Specialist appointment within 3-5 days of discharge Complete Complete Complete  HRI or Home Care Consult Complete Complete Complete  SW Recovery Care/Counseling Consult Complete Complete Complete  Palliative Care Screening Complete Complete Not Applicable  Skilled Nursing Facility Complete Complete Not Applicable

## 2023-12-15 NOTE — Progress Notes (Signed)
 Progress Note   Patient: Larry Callahan FMW:[id number] DOB: Dec 26, 1955 DOA: 11/22/2023     0 DOS: the patient was seen and examined on 12/15/2023   Brief hospital course:  68 year old male with multiple medical problems including: heart failure with reduced EF 40% secondary to nonischemic cardiomyopathy, a flutter status post ablation on Eliquis , CKD stage IV, hypertension, type 2 diabetes, COPD, OSA, HLD, nonobstructive CAD, CVA.  Patient was recently admitted to the hospital and discharged on 6/2.  During that admission he was seen for COPD exacerbation and AKI superimposed on CKD.  He was discharged home in the custody of his nephew with whom he resides.  He then presented to the ED again on 6/4 requesting SNF placement.  He remained in the ED for 4 days pending SNF arrangements.  Ultimately SNF was found to be cost prohibitive and the patient discharged back home to his nephew on 6/8.  He presents again to the ED on this admission requesting long-term placement.  He reports he is no longer safe at home with his nephew but can also not afford skilled nursing facility placement.  ED physician had concerns of tachypnea with expiratory wheeze and requested admission for COPD exacerbation.  Patient's respiratory status appeared to be at baseline and he is medically cleared for discharge.  Stay was then prolonged pending placement.  Insurance company rejected rehab on 6/18 so discharge was canceled.  Looking into other options.  6/23.  Called to see patient secondary to left facial swelling.  Patient with poor dentition.  Will start Solu-Medrol , Rocephin  and Flagyl .    Assessment and Plan:  Left facial swelling- resolved Suspect odontogenic infection.  Facial swelling is improving. CT with soft tissue swelling.  No discernible abscess Completed 7-day course of Rocephin  and metronidazole .  Completed course on 6/29.  Decadron  IV also discontinued 6/29.  Patient is medically ready for discharge.   Pending safe disposition plan.   Hyperkalemia- resolved to normal limits today - monitor tomorrow since starting on ARB 7/3   Chronic systolic HFrEF- ORA. Patient euvolemic.  - on daily lasix  and weekly metolazone . Tolerating well.  - Continue Coreg .   - starting on jardiance  7/3. May titrate up dose as tolerated.    AKI superimposed on CKD IV- resolved to baseline. Cr 3 today. Evaluated by nephrology who signed off. Kidney function stable - starting patient on ARB 7/3. May expect transient increase in Cr with starting of ARB medication but patient will have overall protective quality with use and he needs improved BP control.    Type II diabetes mellitus with renal manifestations (HCC) Recurrent hypoglycemia- hgb A1c 6.7 on 09/18/23 Patient's previous elevated blood glucose likely due to steroid administration. Now that steroids have been discontinued, had 2 mornings of asymptomatic hypoglycemia.  - stopping insulin  and will continue to monitor CBGs. Fasting CBG this morning is 89 since discontinuing his insulin  yesterday. Will continue to monitor off of insulin . - starting jardiance  7/3. Can titrate up dose if tolerating well - consider starting metformin  at low dose with caution to cause GI distress   Atrial flutter  HTN- elevated blood pressures. Status post ablation has good rate control.   - On Eliquis , will continue.  - Continue Coreg  - amlodipine  discontinued as there is no secondary benefit and not well controlled.  - starting irbesartan  at low dose and should be titrated up over the next few days if tolerating to obtain better BP control.  - continue lasix    Housing situation  unstable Rehab was rejected by insurance company.   TOC contacted for alternative options TOC following- meeting with possible LTC option 7/3   Tongue lesion Likely aphthous ulcer Continue to monitor   COPD exacerbation (HCC)- Resolved.  Completed 5-day course of prednisone .   On room air. Oxygen   saturation with vital signs checks   Iron  deficiency anemia Last hemoglobin 9.6 No indication for transfusion - every other day iron  supplement. Take with vitamin C  containing foods for better absorption    Hx of completed stroke  HLD Continue Eliquis  Continue Crestor    DVT prophylaxis: Eliquis  Code Status: Full Family Communication: None Disposition Plan: Status is: Observation The patient will require care spanning > 2 midnights and should be moved to inpatient because: Unsafe discharge plan.  Medically stable for dc.   Level of care: Telemetry Medical   Consultants:  Nephrology       Subjective: none  Physical Exam: Vitals:   12/14/23 2033 12/15/23 0439 12/15/23 0751 12/15/23 1657  BP: (!) 157/73 (!) 151/80 (!) 146/76 (!) 162/75  Pulse: 76 72 73 70  Resp: 18 18 18 18   Temp: 98.1 F (36.7 C) 98.9 F (37.2 C) (!) 97.5 F (36.4 C) 98.1 F (36.7 C)  TempSrc:      SpO2: 97% 97% 95% 100%  Weight:  89.9 kg    Height:       General exam: No acute distress.  Respiratory system: Lungs clear.  Normal work of breathing.  Room air Cardiovascular system: S1-S2, RRR, no murmurs, no pedal edema Central nervous system: Alert and oriented. No focal neurological deficits. Extremities: Symmetric 5 x 5 power. Skin: No rashes, lesions or ulcers Psychiatry: Judgement and insight appear normal. Mood & affect appropriate.    Data Reviewed:  reviewed  Family Communication: None  Disposition: Status is: Observation The patient remains OBS appropriate and will d/c before 2 midnights.  Planned Discharge Destination: Home with Home Health    Time spent: 35 minutes  Author: Makhari Dovidio, MD 12/15/2023 5:20 PM  For on call review [email or web address].

## 2023-12-16 DIAGNOSIS — R220 Localized swelling, mass and lump, head: Secondary | ICD-10-CM | POA: Diagnosis not present

## 2023-12-16 LAB — GLUCOSE, CAPILLARY
Glucose-Capillary: 119 mg/dL — ABNORMAL HIGH (ref 70–99)
Glucose-Capillary: 120 mg/dL — ABNORMAL HIGH (ref 70–99)
Glucose-Capillary: 142 mg/dL — ABNORMAL HIGH (ref 70–99)
Glucose-Capillary: 142 mg/dL — ABNORMAL HIGH (ref 70–99)

## 2023-12-16 NOTE — Progress Notes (Signed)
 Progress Note   Patient: Larry Callahan FMW:[id number] DOB: 08/20/1955 DOA: 11/22/2023     0 DOS: the patient was seen and examined on 12/16/2023   Brief hospital course:  68 year old male with multiple medical problems including: heart failure with reduced EF 40% secondary to nonischemic cardiomyopathy, a flutter status post ablation on Eliquis , CKD stage IV, hypertension, type 2 diabetes, COPD, OSA, HLD, nonobstructive CAD, CVA.  Patient was recently admitted to the hospital and discharged on 6/2.  During that admission he was seen for COPD exacerbation and AKI superimposed on CKD.  He was discharged home in the custody of his nephew with whom he resides.  He then presented to the ED again on 6/4 requesting SNF placement.  He remained in the ED for 4 days pending SNF arrangements.  Ultimately SNF was found to be cost prohibitive and the patient discharged back home to his nephew on 6/8.  He presents again to the ED on this admission requesting long-term placement.  He reports he is no longer safe at home with his nephew but can also not afford skilled nursing facility placement.  ED physician had concerns of tachypnea with expiratory wheeze and requested admission for COPD exacerbation.  Patient's respiratory status appeared to be at baseline and he is medically cleared for discharge.  Stay was then prolonged pending placement.  Insurance company rejected rehab on 6/18 so discharge was canceled.  Looking into other options.  6/23.  Called to see patient secondary to left facial swelling.  Patient with poor dentition.  Will start Solu-Medrol , Rocephin  and Flagyl .  No abscess identified on CT.  He is medically clear for dc. Discharge is pending placement. TOC is following.      Assessment and Plan:   Left facial swelling- resolved Suspect odontogenic infection.  Facial swelling is improving. CT with soft tissue swelling.  No discernible abscess Completed 7-day course of Rocephin  and  metronidazole .  Completed course on 6/29.  Decadron  IV also discontinued 6/29.  Patient is medically ready for discharge.  Pending safe disposition plan.   Hyperkalemia- resolved  - monitor tomorrow since starting on ARB 7/3   Chronic systolic HFrEF- ORA. Patient euvolemic.  - on daily lasix  and weekly metolazone . Tolerating well.  - Continue Coreg .   - starting on jardiance  7/3. May titrate up dose as tolerated.    AKI superimposed on CKD IV- resolved to baseline. Cr 3 today. Evaluated by nephrology who signed off. Kidney function stable - starting patient on ARB 7/3. May expect transient increase in Cr with starting of ARB medication but patient will have overall protective quality with use and he needs improved BP control.    Type II diabetes mellitus with renal manifestations (HCC) Recurrent hypoglycemia- hgb A1c 6.7 on 09/18/23 Patient's previous elevated blood glucose likely due to steroid administration. Now that steroids have been discontinued, had 2 mornings of asymptomatic hypoglycemia.  - stopping insulin  and will continue to monitor CBGs. Fasting CBG this morning is 89 since discontinuing his insulin  yesterday. Will continue to monitor off of insulin . - starting jardiance  7/3. Can titrate up dose if tolerating well - consider starting metformin  at low dose with caution to cause GI distress   Atrial flutter  HTN- elevated blood pressures. Status post ablation has good rate control.   - On Eliquis , will continue.  - Continue Coreg  - amlodipine  discontinued as there is no secondary benefit and not well controlled.  - starting irbesartan  at low dose and should be titrated up  over the next few days if tolerating to obtain better BP control.  - continue lasix    Housing situation unstable Rehab was rejected by insurance company.   TOC contacted for alternative options TOC following- meeting with possible LTC option 7/3   Tongue lesion Likely aphthous ulcer Continue to monitor    COPD exacerbation (HCC)- Resolved.  Completed 5-day course of prednisone .   On room air. Oxygen  saturation with vital signs checks   Iron  deficiency anemia Last hemoglobin 9.6 No indication for transfusion - every other day iron  supplement. Take with vitamin C  containing foods for better absorption    Hx of completed stroke  HLD Continue Eliquis  Continue Crestor    DVT prophylaxis: Eliquis       Subjective: none  Physical Exam: Vitals:   12/15/23 2005 12/16/23 0404 12/16/23 0500 12/16/23 0824  BP: (!) 163/75 (!) 157/79  (!) 164/71  Pulse: 80 75  65  Resp: 18 16  20   Temp: 98.5 F (36.9 C) 98.7 F (37.1 C)  97.7 F (36.5 C)  TempSrc:    Oral  SpO2: 96% 98%  97%  Weight:   89.6 kg   Height:       Constitutional: Alert, awake, calm, comfortable HEENT: Neck supple Respiratory: clear to auscultation bilaterally, no wheezing, no crackles. Normal respiratory effort. No accessory muscle use.  Cardiovascular: Regular rate and rhythm, no murmurs / rubs / gallops. No extremity edema. 2+ pedal pulses. No carotid bruits.  Abdomen: obese abdomen, BS present Musculoskeletal: no clubbing / cyanosis. No joint deformity upper and lower extremities. Good ROM, no contractures. Normal muscle tone.  Skin: no rashes, lesions, ulcers. No induration Neurologic: CN 2-12 grossly intact. Sensation intact, DTR normal. Strength 5/5 x all 4 extremities.  Psychiatric: Normal judgment and insight. Alert and oriented x 3. Normal mood.     Data Reviewed:  There are no new results to review at this time.  Family Communication: None  Disposition: Status is: Observation The patient remains OBS appropriate and will d/c before 2 midnights.  Planned Discharge Destination: Skilled nursing facility    Time spent: 25 minutes  Author: Nena Rebel, MD 12/16/2023 2:43 PM  For on call review [email or web address].

## 2023-12-16 NOTE — Plan of Care (Signed)
  Problem: Health Behavior/Discharge Planning: Goal: Ability to manage health-related needs will improve Outcome: Progressing   Problem: Safety: Goal: Ability to remain free from injury will improve Outcome: Progressing   Problem: Pain Managment: Goal: General experience of comfort will improve and/or be controlled Outcome: Progressing

## 2023-12-16 NOTE — Plan of Care (Signed)
 The patient has not shown any s/s of acute distress.

## 2023-12-17 ENCOUNTER — Observation Stay

## 2023-12-17 DIAGNOSIS — R220 Localized swelling, mass and lump, head: Secondary | ICD-10-CM | POA: Diagnosis not present

## 2023-12-17 DIAGNOSIS — R079 Chest pain, unspecified: Secondary | ICD-10-CM

## 2023-12-17 DIAGNOSIS — Z59819 Housing instability, housed unspecified: Secondary | ICD-10-CM | POA: Diagnosis not present

## 2023-12-17 DIAGNOSIS — K148 Other diseases of tongue: Secondary | ICD-10-CM | POA: Diagnosis not present

## 2023-12-17 LAB — GLUCOSE, CAPILLARY
Glucose-Capillary: 113 mg/dL — ABNORMAL HIGH (ref 70–99)
Glucose-Capillary: 318 mg/dL — ABNORMAL HIGH (ref 70–99)
Glucose-Capillary: 216 mg/dL — ABNORMAL HIGH (ref 70–99)
Glucose-Capillary: 104 mg/dL — ABNORMAL HIGH (ref 70–99)

## 2023-12-17 MED ORDER — INSULIN ASPART 100 UNIT/ML IJ SOLN
0.0000 [IU] | Freq: Three times a day (TID) | INTRAMUSCULAR | Status: DC
  Administered 2023-12-17: 3 [IU] via SUBCUTANEOUS
  Filled 2023-12-17: qty 1

## 2023-12-17 MED ORDER — INSULIN ASPART 100 UNIT/ML IJ SOLN: 0.0000 [IU] | Freq: Every day | INTRAMUSCULAR | Status: DC

## 2023-12-17 NOTE — Progress Notes (Signed)
 Progress Note   Patient: Larry Callahan FMW:[id number] DOB: [birth year] DOA: 11/22/2023     0 DOS: the patient was seen and examined on 12/17/2023   Brief hospital course:  68 year old male with multiple medical problems including: heart failure with reduced EF 40% secondary to nonischemic cardiomyopathy, a flutter status post ablation on Eliquis , CKD stage IV, hypertension, type 2 diabetes, COPD, OSA, HLD, nonobstructive CAD, CVA.  Patient was recently admitted to the hospital and discharged on 6/2.  During that admission he was seen for COPD exacerbation and AKI superimposed on CKD.  He was discharged home in the custody of his nephew with whom he resides.  He then presented to the ED again on 6/4 requesting SNF placement.  He remained in the ED for 4 days pending SNF arrangements.  Ultimately SNF was found to be cost prohibitive and the patient discharged back home to his nephew on 6/8.  He presents again to the ED on this admission requesting long-term placement.  He reports he is no longer safe at home with his nephew but can also not afford skilled nursing facility placement.  ED physician had concerns of tachypnea with expiratory wheeze and requested admission for COPD exacerbation.  Patient's respiratory status appeared to be at baseline and he is medically cleared for discharge.  Stay was then prolonged pending placement.  Insurance company rejected rehab on 6/18 so discharge was canceled.  Looking into other options.  6/23.  Called to see patient secondary to left facial swelling.  Patient with poor dentition.  Will start Solu-Medrol , Rocephin  and Flagyl .  No abscess identified on CT.  He is medically clear for dc. Discharge is pending placement. TOC is following.      Assessment and Plan: * Chest pain With cough.  Chest x-ray negative.  Patient already on blood thinner for anticoagulation.  Continue to monitor.  Housing situation unstable Rehab was rejected by [employer].   TOC looking into other options.  Tongue lesion Resolved  Left facial swelling The patient does have poor dentition.  Suspect dental abscess with facial swelling.  Completed antibiotics and steroid course.  Chronic systolic CHF (congestive heart failure) (HCC) Patient euvolemic.  Holding off on diuretics secondary to kidney function.  Patient not short of breath and breathing comfortably on room air.  Continue Coreg .  Last EF shows a improved ejection fraction of 50%.  Acute kidney injury superimposed on CKD (HCC) AKI on CKD stage IV.  Baseline creatinine around 3.5.  Patient creatinine was up at 4.7.  Holding diuretic.  Last creatinine 2.87 with a GFR of 23.  On oral sodium bicarb.  Type II diabetes mellitus with renal manifestations (HCC) And hyperglycemia and hypoglycemia.  Patient on Jardiance .  Will place back on sliding scale insulin .  Atrial flutter (HCC) Status post ablation.  Continue Eliquis  for anticoagulation.  On lower dose Coreg .  Essential hypertension Continue Coreg , Norvasc   COPD exacerbation (HCC) Resolved.  Completed 5-day course of prednisone .  On room air.  HLD (hyperlipidemia) Continue Crestor   Iron  deficiency anemia Last hemoglobin 8.8  Hx of completed stroke Continue Eliquis         Subjective: Patient complaining of right-sided chest pain when he coughs and takes a deep breath.  Patient already on Eliquis  for anticoagulation.  Chest x-ray negative for pneumonia.  Physical Exam: Vitals:   12/16/23 2012 12/17/23 0459 12/17/23 0500 12/17/23 0852  BP: (!) 152/95 (!) 144/76  (!) 134/92  Pulse: 74 69  61  Resp: 18 18  18  Temp: 98.2 F (36.8 C) 98 F (36.7 C)  97.8 F (36.6 C)  TempSrc: Oral     SpO2: 96% 93%  97%  Weight:   87.3 kg   Height:       Physical Exam HENT:     Head: Normocephalic.  Eyes:     General: Lids are normal.     Conjunctiva/sclera: Conjunctivae normal.  Cardiovascular:     Rate and Rhythm: Normal rate and regular  rhythm.     Heart sounds: Normal heart sounds, S1 normal and S2 normal.  Pulmonary:     Breath sounds: No decreased breath sounds, wheezing, rhonchi or rales.  Abdominal:     Palpations: Abdomen is soft.     Tenderness: There is no abdominal tenderness.  Musculoskeletal:     Right lower leg: No swelling.     Left lower leg: No swelling.  Skin:    General: Skin is warm.     Findings: No rash.  Neurological:     Mental Status: He is alert.     Data Reviewed: Chest x-ray negative, last hemoglobin 8.8, last creatinine 2.87  Disposition: Status is: Observation TOC looking at options  Planned Discharge Destination: Will need placement    Time spent: 28 minutes  Author: Charlie Patterson, MD 12/17/2023 1:22 PM  For on call review [email or web address].

## 2023-12-17 NOTE — Assessment & Plan Note (Signed)
 With cough.  Chest x-ray negative.  Patient already on blood thinner for anticoagulation.  Continue to monitor.

## 2023-12-17 NOTE — Plan of Care (Signed)
 The patient has not had any s/s of acute distress. The patient is asleep at present.

## 2023-12-17 NOTE — Plan of Care (Signed)
   Problem: Education: Goal: Knowledge of General Education information will improve Description Including pain rating scale, medication(s)/side effects and non-pharmacologic comfort measures Outcome: Progressing

## 2023-12-18 DIAGNOSIS — Z59819 Housing instability, housed unspecified: Secondary | ICD-10-CM | POA: Diagnosis not present

## 2023-12-18 DIAGNOSIS — R220 Localized swelling, mass and lump, head: Secondary | ICD-10-CM | POA: Diagnosis not present

## 2023-12-18 DIAGNOSIS — K148 Other diseases of tongue: Secondary | ICD-10-CM | POA: Diagnosis not present

## 2023-12-18 DIAGNOSIS — J441 Chronic obstructive pulmonary disease with (acute) exacerbation: Secondary | ICD-10-CM | POA: Diagnosis not present

## 2023-12-18 LAB — GLUCOSE, CAPILLARY
Glucose-Capillary: 115 mg/dL — ABNORMAL HIGH (ref 70–99)
Glucose-Capillary: 106 mg/dL — ABNORMAL HIGH (ref 70–99)
Glucose-Capillary: 139 mg/dL — ABNORMAL HIGH (ref 70–99)

## 2023-12-18 MED ORDER — IRBESARTAN 75 MG PO TABS
75.0000 mg | ORAL_TABLET | Freq: Every day | ORAL | 0 refills | Status: DC
Start: 2023-12-19 — End: 2023-12-18

## 2023-12-18 MED ORDER — FERROUS SULFATE 325 (65 FE) MG PO TABS: 325.0000 mg | ORAL_TABLET | ORAL | Status: AC

## 2023-12-18 MED ORDER — EMPAGLIFLOZIN 10 MG PO TABS: 10.0000 mg | ORAL_TABLET | Freq: Every day | ORAL | 0 refills | Status: DC

## 2023-12-18 MED ORDER — IRBESARTAN 75 MG PO TABS
75.0000 mg | ORAL_TABLET | Freq: Every day | ORAL | 0 refills | Status: DC
Start: 2023-12-19 — End: 2024-03-12

## 2023-12-18 NOTE — Discharge Summary (Signed)
 Physician Discharge Summary   Patient: Larry Callahan MRN: [id number] DOB: 12-14-1955  Admit date:     11/22/2023  Discharge date: 12/18/23  Discharge Physician: Charlie Patterson   PCP: Liana Fish, NP   Recommendations at discharge:   Follow-up with team at facility Follow-up with PCP Follow-up nephrology 2 weeks Refer to CHF clinic  Discharge Diagnoses: Active Problems:   Housing situation unstable   Tongue lesion   Chronic systolic CHF (congestive heart failure) (HCC)   Chest pain   Left facial swelling   Acute kidney injury superimposed on CKD (HCC)   Essential hypertension   Atrial flutter (HCC)   Type II diabetes mellitus with renal manifestations (HCC)   HLD (hyperlipidemia)   COPD exacerbation (HCC)   CAD (coronary artery disease)   Metabolic acidosis, normal anion gap (NAG)   Iron  deficiency anemia   Hx of completed stroke    Hospital Course:  68 year old male with multiple medical problems including: heart failure with reduced EF 40% secondary to nonischemic cardiomyopathy, a flutter status post ablation on Eliquis , CKD stage IV, hypertension, type 2 diabetes, COPD, OSA, HLD, nonobstructive CAD, CVA.  Patient was recently admitted to the hospital and discharged on 6/2.  During that admission he was seen for COPD exacerbation and AKI superimposed on CKD.  He was discharged home in the custody of his nephew with whom he resides.  He then presented to the ED again on 6/4 requesting SNF placement.  He remained in the ED for 4 days pending SNF arrangements.  Ultimately SNF was found to be cost prohibitive and the patient discharged back home to his nephew on 6/8.  He presents again to the ED on this admission requesting long-term placement.  He reports he is no longer safe at home with his nephew but can also not afford skilled nursing facility placement.  ED physician had concerns of tachypnea with expiratory wheeze and requested admission for COPD exacerbation.   Patient's respiratory status appeared to be at baseline and he is medically cleared for discharge.  Stay was then prolonged pending placement.  Insurance company rejected rehab on 6/18 so discharge was canceled.  Looking into other options.  6/23.  Called to see patient secondary to left facial swelling.  Patient with poor dentition.  Will start Solu-Medrol , Rocephin  and Flagyl .  No abscess identified on CT.  He is medically clear for dc. Discharge is pending placement. TOC is following.      Assessment and Plan: Housing situation unstable Rehab was rejected by [employer].  TOC arranged a facility for him to go to.  Tongue lesion Resolved  Left facial swelling Resolved.  The patient does have poor dentition.  Suspect dental abscess with facial swelling.  Completed antibiotics and steroid course.  Chest pain Right-sided with cough.  Chest x-ray negative on 7/6.SABRA  Patient already on blood thinner for anticoagulation, making pulmonary embolism unlikely.  Continue to monitor.  Patient declined any further testing on 7/7.   Chronic systolic CHF (congestive heart failure) (HCC) Patient euvolemic.  Patient on Avapro , Lasix  and weekly metolazone .  Continue Coreg .  Last EF shows a improved ejection fraction of 50%.  Acute kidney injury superimposed on CKD (HCC) AKI on CKD stage IV.  Baseline creatinine around 3.5.  Patient creatinine was up at 4.7.  Holding diuretic.  Last creatinine 2.87 with a GFR of 23.  On oral sodium bicarb.  Check CBC and BMP 1 week.  Type II diabetes mellitus with renal manifestations (HCC)  And hyperglycemia and hypoglycemia.  Patient on Jardiance .  Will place back on sliding scale insulin .  Holding long-acting insulin  currently.  Atrial flutter (HCC) Status post ablation.  Continue Eliquis  for anticoagulation.  On lower dose Coreg .  Essential hypertension Continue Coreg , Avapro  and Lasix   COPD exacerbation (HCC) Resolved.  Completed 5-day course of  prednisone .  On room air.  HLD (hyperlipidemia) Continue Crestor   Iron  deficiency anemia Last hemoglobin 8.8  Hx of completed stroke Continue Eliquis          Consultants: Nephrology Procedures performed: None Disposition: Long-term care Diet recommendation:  Cardiac and Carb modified diet DISCHARGE MEDICATION: Allergies as of 12/18/2023       Reactions   Penicillins Rash, Dermatitis, Hives, Other (See Comments)   Other reaction(s): HIVES   Codeine  Nausea And Vomiting        Medication List     STOP taking these medications    amLODipine  5 MG tablet Commonly known as: NORVASC    Banophen  25 MG capsule Generic drug: diphenhydrAMINE    hydrALAZINE  100 MG tablet Commonly known as: APRESOLINE    Insulin  lispro 100 UNIT/ML Commonly known as: HUMALOG  Junior KwikPen   ipratropium-albuterol  0.5-2.5 (3) MG/3ML Soln Commonly known as: DUONEB   Novofine Pen Needle 32G X 6 MM Misc Generic drug: Insulin  Pen Needle   PERIGUARD EX   potassium chloride  SA 20 MEQ tablet Commonly known as: KLOR-CON  M   Toujeo  Max SoloStar 300 UNIT/ML Solostar Pen Generic drug: insulin  glargine (2 Unit Dial )       TAKE these medications    Accu-Chek Guide test strip Generic drug: glucose blood Use to check blood sugars 4 times a day E11.65   Accu-Chek Softclix Lancets lancets Use to check blood sugars 4 times a day   E11.65   acetaminophen  325 MG tablet Commonly known as: TYLENOL  Take 2 tablets (650 mg total) by mouth every 6 (six) hours as needed for mild pain or moderate pain (or Fever >/= 101).   albuterol  108 (90 Base) MCG/ACT inhaler Commonly known as: VENTOLIN  HFA Inhale 1-2 puffs into the lungs every 4 (four) hours as needed for wheezing or shortness of breath.   apixaban  5 MG Tabs tablet Commonly known as: Eliquis  Take 1 tablet (5 mg total) by mouth 2 (two) times daily.   Breztri  Aerosphere 160-9-4.8 MCG/ACT Aero inhaler Generic drug:  budesonide -glycopyrrolate -formoterol  Inhale 2 puffs into the lungs 2 (two) times daily.   carvedilol  3.125 MG tablet Commonly known as: COREG  Take 1 tablet (3.125 mg total) by mouth 2 (two) times daily with a meal. What changed:  medication strength how much to take when to take this   cyanocobalamin  1000 MCG tablet Take 1 tablet (1,000 mcg total) by mouth daily.   empagliflozin  10 MG Tabs tablet Commonly known as: JARDIANCE  Take 1 tablet (10 mg total) by mouth daily. Start taking on: December 19, 2023   ferrous sulfate  325 (65 FE) MG tablet Take 1 tablet (325 mg total) by mouth every other day. Start taking on: December 19, 2023 What changed: when to take this   furosemide  40 MG tablet Commonly known as: LASIX  Take 1 tablet (40 mg total) by mouth daily.   insulin  aspart 100 UNIT/ML injection Commonly known as: novoLOG  Inject 2 Units into the skin 3 (three) times daily with meals. If eating and Blood Glucose (BG) 80 or higher inject 2 units for meal coverage and add correction dose per scale. If not eating, correction dose only. BG <150= 0 unit; BG 150-200=  1 unit; BG 201-250= 2 unit; BG 251-300= 3 unit; BG 301-350= 4 unit; BG 351-400= 5 unit; BG >400= 6 unit and Call Primary Care.   irbesartan  75 MG tablet Commonly known as: AVAPRO  Take 1 tablet (75 mg total) by mouth daily. Start taking on: December 19, 2023   methocarbamol  750 MG tablet Commonly known as: ROBAXIN  Take 1 tablet (750 mg total) by mouth every 6 (six) hours as needed for muscle spasms.   metolazone  2.5 MG tablet Commonly known as: ZAROXOLYN  Take once weekly on Tuesdays   pantoprazole  40 MG tablet Commonly known as: PROTONIX  Take 1 tablet (40 mg total) by mouth daily.   rosuvastatin  10 MG tablet Commonly known as: CRESTOR  Take 1 tablet (10 mg total) by mouth at bedtime.   sodium bicarbonate  650 MG tablet Take 1 tablet (650 mg total) by mouth 3 (three) times daily. What changed: See the new instructions.   True  Metrix Air Glucose Meter Devi 1 Device by Does not apply route 2 (two) times daily.        Contact information for follow-up providers     Liana Fish, NP Follow up.   Specialty: Nurse Practitioner Why: hospital follow up Contact information: [address] [place] [address] [phone]         Marcelino Gales, MD Follow up in 2 week(s).   Specialty: Nephrology Contact information: [address] [place] [address] [phone]              Contact information for after-discharge care     Destination     [facility] .   Service: Skilled Nursing Contact information: [address] [place]  [address] [phone]                    Discharge Exam: Filed Weights   12/16/23 0500 12/17/23 0500 12/18/23 0419  Weight: 89.6 kg 87.3 kg 82.7 kg   Physical Exam HENT:     Head: Normocephalic.  Eyes:     General: Lids are normal.     Conjunctiva/sclera: Conjunctivae normal.  Cardiovascular:     Rate and Rhythm: Normal rate and regular rhythm.     Heart sounds: Normal heart sounds, S1 normal and S2 normal.  Pulmonary:     Breath sounds: No decreased breath sounds, wheezing, rhonchi or rales.  Abdominal:     Palpations: Abdomen is soft.     Tenderness: There is no abdominal tenderness.  Musculoskeletal:     Right lower leg: No swelling.     Left lower leg: No swelling.  Skin:    General: Skin is warm.     Findings: No rash.  Neurological:     Mental Status: He is alert.      Condition at discharge: stable  The results of significant diagnostics from this hospitalization (including imaging, microbiology, ancillary and laboratory) are listed below for reference.   Imaging Studies: DG Chest 2 View Result Date: 12/17/2023 CLINICAL DATA:  Cough. EXAM: CHEST - 2 VIEW COMPARISON:  Chest radiograph 11/22/2023 and 11/07/2023. Chest CT 08/01/2022. FINDINGS: The heart size and mediastinal contours  are stable with mild aortic tortuosity. Unchanged small bilateral pleural effusions and bibasilar atelectasis. No edema, confluent airspace disease or pneumothorax. Stable degenerative changes in the spine without evidence of acute osseous abnormality. IMPRESSION: Unchanged small bilateral pleural effusions and bibasilar atelectasis. No evidence of pneumonia or edema. Electronically Signed   By: Elsie Gertrude HERO.D.  On: 12/17/2023 13:12   CT MAXILLOFACIAL WO CONTRAST Result Date: 12/06/2023 CLINICAL DATA:  Facial swelling, concern for ondotogenic infection. CKD precludes contrast use EXAM: CT MAXILLOFACIAL WITHOUT CONTRAST TECHNIQUE: Multidetector CT imaging of the maxillofacial structures was performed. Multiplanar CT image reconstructions were also generated. RADIATION DOSE REDUCTION: This exam was performed according to the departmental dose-optimization program which includes automated exposure control, adjustment of the mA and/or kV according to patient size and/or use of iterative reconstruction technique. COMPARISON:  None Available. FINDINGS: Osseous: No evidence of acute fracture. Multiple missing teeth. Multiple dental caries, most conspicuous involving the right maxillary teeth. TMJs are located. Orbits: Negative. No traumatic or inflammatory finding. Sinuses: Clear sinuses. Soft tissues: Question subtle edema in the left upper neck and lower face with slight thickening of the platysma. No discrete, drainable fluid collection. The absence of contrast limits evaluation. Limited intracranial: No significant or unexpected finding. IMPRESSION: Question subtle edema in the left upper neck and lower face with slight thickening of the platysma, potentially infectious but nonspecific. No discrete, drainable fluid collection. The submandibular and parotid glands are unremarkable. Electronically Signed   By: Gilmore GORMAN Molt M.D.   On: 12/06/2023 14:44   DG Chest 2 View Result Date: 11/22/2023 CLINICAL  DATA:  Shortness of breath. EXAM: CHEST - 2 VIEW COMPARISON:  Nov 07, 2023. FINDINGS: Stable cardiomediastinal silhouette. Mild bibasilar subsegmental atelectasis is noted with small pleural effusions. Bony thorax is unremarkable. IMPRESSION: Mild bibasilar subsegmental atelectasis with small pleural effusions. Electronically Signed   By: Lynwood Landy Raddle M.D.   On: 11/22/2023 17:40    Microbiology: Results for orders placed or performed during the hospital encounter of 11/22/23  Resp panel by RT-PCR (RSV, Flu A&B, Covid) Anterior Nasal Swab     Status: None   Collection Time: 11/22/23  8:26 PM   Specimen: Anterior Nasal Swab  Result Value Ref Range Status   SARS Coronavirus 2 by RT PCR NEGATIVE NEGATIVE Final    Comment: (NOTE) SARS-CoV-2 target nucleic acids are NOT DETECTED.  The SARS-CoV-2 RNA is generally detectable in upper respiratory specimens during the acute phase of infection. The lowest concentration of SARS-CoV-2 viral copies this assay can detect is 138 copies/mL. A negative result does not preclude SARS-Cov-2 infection and should not be used as the sole basis for treatment or other patient management decisions. A negative result may occur with  improper specimen collection/handling, submission of specimen other than nasopharyngeal swab, presence of viral mutation(s) within the areas targeted by this assay, and inadequate number of viral copies(<138 copies/mL). A negative result must be combined with clinical observations, patient history, and epidemiological information. The expected result is Negative.  Fact Sheet for Patients:  BloggerCourse.com  Fact Sheet for Healthcare Providers:  SeriousBroker.it  This test is no t yet approved or cleared by the [place]  FDA and  has been authorized for detection and/or diagnosis of SARS-CoV-2 by FDA under an Emergency Use Authorization (EUA). This EUA will remain  in effect  (meaning this test can be used) for the duration of the COVID-19 declaration under Section 564(b)(1) of the Act, 21 U.S.C.section 360bbb-3(b)(1), unless the authorization is terminated  or revoked sooner.       Influenza A by PCR NEGATIVE NEGATIVE Final   Influenza B by PCR NEGATIVE NEGATIVE Final    Comment: (NOTE) The Xpert Xpress SARS-CoV-2/FLU/RSV plus assay is intended as an aid in the diagnosis of influenza from Nasopharyngeal swab specimens and should not be used as a sole  basis for treatment. Nasal washings and aspirates are unacceptable for Xpert Xpress SARS-CoV-2/FLU/RSV testing.  Fact Sheet for Patients: BloggerCourse.com  Fact Sheet for Healthcare Providers: SeriousBroker.it  This test is not yet approved or cleared by the [place]  FDA and has been authorized for detection and/or diagnosis of SARS-CoV-2 by FDA under an Emergency Use Authorization (EUA). This EUA will remain in effect (meaning this test can be used) for the duration of the COVID-19 declaration under Section 564(b)(1) of the Act, 21 U.S.C. section 360bbb-3(b)(1), unless the authorization is terminated or revoked.     Resp Syncytial Virus by PCR NEGATIVE NEGATIVE Final    Comment: (NOTE) Fact Sheet for Patients: BloggerCourse.com  Fact Sheet for Healthcare Providers: SeriousBroker.it  This test is not yet approved or cleared by the [place]  FDA and has been authorized for detection and/or diagnosis of SARS-CoV-2 by FDA under an Emergency Use Authorization (EUA). This EUA will remain in effect (meaning this test can be used) for the duration of the COVID-19 declaration under Section 564(b)(1) of the Act, 21 U.S.C. section 360bbb-3(b)(1), unless the authorization is terminated or revoked.  Performed at [facility], [address] [place], [place] [address]      Labs: CBC: Recent Labs  Lab 12/13/23 [phone] 12/15/23 0420  WBC 8.7 6.4  HGB 9.6* 8.8*  HCT 29.8* 27.6*  MCV 87.9 88.2  PLT 143* 106*   Basic Metabolic Panel: Recent Labs  Lab 12/13/23 0928 12/15/23 0420  NA 138 142  K 4.8 4.4  CL 109 108  CO2 20* 23  GLUCOSE 168* 135*  BUN 99* 91*  CREATININE 3.05* 2.87*  CALCIUM  7.8* 7.9*   Liver Function Tests: Recent Labs  Lab 12/13/23 0928  AST 14*  ALT 15  ALKPHOS 53  BILITOT 0.4  PROT 4.9*  ALBUMIN  2.1*   CBG: Recent Labs  Lab 12/17/23 1158 12/17/23 1643 12/17/23 2044 12/18/23 0738 12/18/23 1155  GLUCAP 318* 216* 104* 115* 106*    Discharge time spent: greater than 30 minutes.  Signed: Charlie Patterson, MD [facility] 12/18/2023

## 2023-12-18 NOTE — TOC Transition Note (Addendum)
 Transition of Care [facility]) - Discharge Note   Patient Details  Name: Larry Callahan MRN: [id number] Date of Birth: 01-29-1956  Transition of Care [facility]) CM/SW Contact:  Elouise LULLA Capri, RN 12/18/2023, 1:42 PM   Clinical Narrative:     Discharge orders noted for SNF. BLS transportation scheduled for 1500 today via [facility] [employer].    12/28/2023--Per Carol, [facility] [employer], request to fax 12/18/2023 medical necessity transport form due to no CM signature or credentials. CM notes, CM signature and credentials and printed copy of medical necessity form dated 12/18/2023. No signature/credentials noted on printed medical necessity. CM signed medical necessity form. CM will fax to [phone]. Final next level of care: Skilled Nursing Facility Barriers to Discharge: No Barriers Identified   Patient Goals and CMS Choice    SNF/[facility], [place], [place]   Discharge Placement     SNF/[facility], [place], [place]           Discharge Plan and Services Additional resources added to the After Visit Summary for     Social Drivers of Health (SDOH) Interventions SDOH Screenings   Food Insecurity: No Food Insecurity (11/23/2023)  Housing: High Risk (11/23/2023)  Transportation Needs: No Transportation Needs (11/23/2023)  Utilities: Not At Risk (11/23/2023)  Alcohol Screen: Low Risk  (03/29/2022)  Depression (PHQ2-9): Low Risk  (09/13/2022)  Financial Resource Strain: Low Risk  (11/16/2023)  Social Connections: Unknown (11/23/2023)  Tobacco Use: High Risk (11/22/2023)     Readmission Risk Interventions    12/01/2021   12:25 PM 08/07/2021    2:36 PM 06/28/2021    2:26 PM  Readmission Risk Prevention Plan  Transportation Screening Complete Complete Complete  Medication Review Oceanographer) Complete Complete Complete  PCP or Specialist appointment within 3-5 days of discharge Complete Complete Complete  HRI or Home Care Consult Complete Complete Complete  SW Recovery  Care/Counseling Consult Complete Complete Complete  Palliative Care Screening Complete Complete Not Applicable  Skilled Nursing Facility Complete Complete Not Applicable

## 2023-12-18 NOTE — TOC Progression Note (Signed)
 Transition of Care [facility]) - Progression Note    Patient Details  Name: Larry Callahan MRN: [id number] Date of Birth: 04/29/1956  Transition of Care [facility]) CM/SW Contact  Elouise LULLA Capri, RN 12/18/2023, 9:19 AM  Clinical Narrative:     CM secure message to [place], Admissions, [facility], [place], [place] regarding SNF acceptance on [facility] contract. Per Daril, awaiting confirmation from [employer], Zachary B. CM secure email to Zachary B and Francine D regarding [employer] awaiting confirmation of SNF contract approval. Per Daril, if SNF contract approved, SNF can accept today.   Secure message received from [place], Admissions, SNF can accept today to Room F-8. CM alert to Dr. Josette and RN Lyle regarding SNF room number and RN report number. CM call to [employer], phone: [phone] regarding scheduling BLS transport. CM spoke to [place]; BLS transport scheduled for 1500.  Expected Discharge Plan: Skilled Nursing Facility Barriers to Discharge: No Barriers Identified  Expected Discharge Plan and Services    SNF LTC     Expected Discharge Date: 11/29/23                Social Determinants of Health (SDOH) Interventions SDOH Screenings   Food Insecurity: No Food Insecurity (11/23/2023)  Housing: High Risk (11/23/2023)  Transportation Needs: No Transportation Needs (11/23/2023)  Utilities: Not At Risk (11/23/2023)  Alcohol Screen: Low Risk  (03/29/2022)  Depression (PHQ2-9): Low Risk  (09/13/2022)  Financial Resource Strain: Low Risk  (11/16/2023)  Social Connections: Unknown (11/23/2023)  Tobacco Use: High Risk (11/22/2023)    Readmission Risk Interventions    12/01/2021   12:25 PM 08/07/2021    2:36 PM 06/28/2021    2:26 PM  Readmission Risk Prevention Plan  Transportation Screening Complete Complete Complete  Medication Review Oceanographer) Complete Complete Complete  PCP or Specialist appointment within 3-5 days of discharge Complete Complete  Complete  HRI or Home Care Consult Complete Complete Complete  SW Recovery Care/Counseling Consult Complete Complete Complete  Palliative Care Screening Complete Complete Not Applicable  Skilled Nursing Facility Complete Complete Not Applicable

## 2023-12-26 ENCOUNTER — Other Ambulatory Visit: Payer: Self-pay

## 2023-12-26 ENCOUNTER — Emergency Department
Admission: EM | Admit: 2023-12-26 | Discharge: 2023-12-26 | Disposition: A | Discharge status: Home / Self Care | Attending: Emergency Medicine | Admitting: Emergency Medicine

## 2023-12-26 DIAGNOSIS — J449 Chronic obstructive pulmonary disease, unspecified: Secondary | ICD-10-CM | POA: Insufficient documentation

## 2023-12-26 DIAGNOSIS — E1122 Type 2 diabetes mellitus with diabetic chronic kidney disease: Secondary | ICD-10-CM | POA: Insufficient documentation

## 2023-12-26 DIAGNOSIS — K0889 Other specified disorders of teeth and supporting structures: Secondary | ICD-10-CM | POA: Insufficient documentation

## 2023-12-26 DIAGNOSIS — I2510 Atherosclerotic heart disease of native coronary artery without angina pectoris: Secondary | ICD-10-CM | POA: Diagnosis not present

## 2023-12-26 DIAGNOSIS — I129 Hypertensive chronic kidney disease with stage 1 through stage 4 chronic kidney disease, or unspecified chronic kidney disease: Secondary | ICD-10-CM | POA: Diagnosis not present

## 2023-12-26 DIAGNOSIS — K148 Other diseases of tongue: Secondary | ICD-10-CM | POA: Insufficient documentation

## 2023-12-26 DIAGNOSIS — N189 Chronic kidney disease, unspecified: Secondary | ICD-10-CM | POA: Diagnosis not present

## 2023-12-26 MED ORDER — AZITHROMYCIN 250 MG PO TABS: ORAL_TABLET | ORAL | 0 refills | Status: AC

## 2023-12-26 NOTE — ED Provider Notes (Signed)
   [facility] Provider Note    Event Date/Time   First MD Initiated Contact with Patient 12/26/23 1335     (approximate)   History   Dental Pain   HPI  [place] Deleo Standard is a 68 y.o. male with past medical history of CKD COPD CAD diabetes, hypertension who presents with complaints of 1 month of dental pain.  Right upper molar has been bothering him for a month and he states it has been irritating his tongue.  No fevers     Physical Exam   Triage Vital Signs: ED Triage Vitals [12/26/23 1249]  Encounter Vitals Group     BP 136/83     Girls Systolic BP Percentile      Girls Diastolic BP Percentile      Boys Systolic BP Percentile      Boys Diastolic BP Percentile      Pulse Rate 80     Resp 17     Temp 98.7 F (37.1 C)     Temp src      SpO2 100 %     Weight 82.7 kg (182 lb 5.1 oz)     Height 1.803 m (5' 11)     Head Circumference      Peak Flow      Pain Score 10     Pain Loc      Pain Education      Exclude from Growth Chart     Most recent vital signs: Vitals:   12/26/23 1249  BP: 136/83  Pulse: 80  Resp: 17  Temp: 98.7 F (37.1 C)  SpO2: 100%     General: Awake, no distress.  CV:  Good peripheral perfusion.  Resp:  Normal effort.  Abd:  No distention.  Other:  Extremely poor dentition multiple missing teeth, single right upper molar tender to palpation along the base with erythema, no abscess.  Patient also has lesion on the anterior aspect of his tongue which has concerning appearance   ED Results / Procedures / Treatments   Labs (all labs ordered are listed, but only abnormal results are displayed) Labs Reviewed - No data to display   EKG     RADIOLOGY     PROCEDURES:  Critical Care performed:   Procedures   MEDICATIONS ORDERED IN ED: Medications - No data to display   IMPRESSION / MDM / ASSESSMENT AND PLAN / ED COURSE  I reviewed the triage vital signs and the nursing notes. Patient's  presentation is most consistent with acute, uncomplicated illness.  Patient with likely dental infection, will start the patient on azithromycin  given penicillin allergy.  Concerning lesion on the tongue, discussed with him the need for close follow-up with ENT for likely biopsy and outpatient follow-up with dentistry for extraction        FINAL CLINICAL IMPRESSION(S) / ED DIAGNOSES   Final diagnoses:  Pain, dental  Tongue lesion     Rx / DC Orders   ED Discharge Orders          Ordered    azithromycin  (ZITHROMAX  Z-PAK) 250 MG tablet        12/26/23 1323             Note:  This document was prepared using Dragon voice recognition software and may include unintentional dictation errors.   Arlander Charleston, MD 12/26/23 [phone]

## 2023-12-26 NOTE — Discharge Instructions (Signed)
 Patient has concerning lesion on his tongue, this needs to be evaluated by an ENT to make sure it is not cancerous  He needs to see a dentist regarding his tooth pain, we have started him on antibiotics

## 2023-12-26 NOTE — ED Notes (Signed)
 [facility] called for transported upon discharge.

## 2023-12-26 NOTE — ED Triage Notes (Signed)
 Pt comes with c/o dental pain for  a month. Pt sent over from [facility]. Pt states bad tooth. VSS

## 2024-01-30 ENCOUNTER — Ambulatory Visit: Admitting: Nurse Practitioner

## 2024-03-04 ENCOUNTER — Encounter: Payer: Self-pay | Admitting: Internal Medicine

## 2024-03-04 ENCOUNTER — Other Ambulatory Visit: Payer: Self-pay

## 2024-03-04 ENCOUNTER — Observation Stay

## 2024-03-04 ENCOUNTER — Emergency Department

## 2024-03-04 ENCOUNTER — Inpatient Hospital Stay
Admission: EM | Admit: 2024-03-04 | Discharge: 2024-03-12 | DRG: 291 | Disposition: A | Discharge status: Skilled Nursing Facility | Source: Skilled Nursing Facility | Attending: Internal Medicine | Admitting: Internal Medicine

## 2024-03-04 DIAGNOSIS — Z8616 Personal history of COVID-19: Secondary | ICD-10-CM

## 2024-03-04 DIAGNOSIS — I5023 Acute on chronic systolic (congestive) heart failure: Secondary | ICD-10-CM | POA: Diagnosis present

## 2024-03-04 DIAGNOSIS — I509 Heart failure, unspecified: Secondary | ICD-10-CM

## 2024-03-04 DIAGNOSIS — I132 Hypertensive heart and chronic kidney disease with heart failure and with stage 5 chronic kidney disease, or end stage renal disease: Principal | ICD-10-CM | POA: Diagnosis present

## 2024-03-04 DIAGNOSIS — Z9049 Acquired absence of other specified parts of digestive tract: Secondary | ICD-10-CM

## 2024-03-04 DIAGNOSIS — J449 Chronic obstructive pulmonary disease, unspecified: Secondary | ICD-10-CM | POA: Diagnosis present

## 2024-03-04 DIAGNOSIS — Z794 Long term (current) use of insulin: Secondary | ICD-10-CM

## 2024-03-04 DIAGNOSIS — N179 Acute kidney failure, unspecified: Secondary | ICD-10-CM | POA: Diagnosis present

## 2024-03-04 DIAGNOSIS — Z4901 Encounter for fitting and adjustment of extracorporeal dialysis catheter: Secondary | ICD-10-CM

## 2024-03-04 DIAGNOSIS — Z8701 Personal history of pneumonia (recurrent): Secondary | ICD-10-CM

## 2024-03-04 DIAGNOSIS — K149 Disease of tongue, unspecified: Secondary | ICD-10-CM | POA: Diagnosis present

## 2024-03-04 DIAGNOSIS — Z7951 Long term (current) use of inhaled steroids: Secondary | ICD-10-CM

## 2024-03-04 DIAGNOSIS — M25511 Pain in right shoulder: Principal | ICD-10-CM

## 2024-03-04 DIAGNOSIS — Z833 Family history of diabetes mellitus: Secondary | ICD-10-CM

## 2024-03-04 DIAGNOSIS — E785 Hyperlipidemia, unspecified: Secondary | ICD-10-CM | POA: Diagnosis present

## 2024-03-04 DIAGNOSIS — Z885 Allergy status to narcotic agent status: Secondary | ICD-10-CM

## 2024-03-04 DIAGNOSIS — I5084 End stage heart failure: Secondary | ICD-10-CM | POA: Diagnosis present

## 2024-03-04 DIAGNOSIS — Z801 Family history of malignant neoplasm of trachea, bronchus and lung: Secondary | ICD-10-CM

## 2024-03-04 DIAGNOSIS — F17210 Nicotine dependence, cigarettes, uncomplicated: Secondary | ICD-10-CM | POA: Diagnosis present

## 2024-03-04 DIAGNOSIS — Z6826 Body mass index (BMI) 26.0-26.9, adult: Secondary | ICD-10-CM

## 2024-03-04 DIAGNOSIS — Z992 Dependence on renal dialysis: Secondary | ICD-10-CM

## 2024-03-04 DIAGNOSIS — E669 Obesity, unspecified: Secondary | ICD-10-CM | POA: Diagnosis present

## 2024-03-04 DIAGNOSIS — I5043 Acute on chronic combined systolic (congestive) and diastolic (congestive) heart failure: Secondary | ICD-10-CM | POA: Diagnosis not present

## 2024-03-04 DIAGNOSIS — I428 Other cardiomyopathies: Secondary | ICD-10-CM | POA: Diagnosis present

## 2024-03-04 DIAGNOSIS — Z66 Do not resuscitate: Secondary | ICD-10-CM | POA: Diagnosis present

## 2024-03-04 DIAGNOSIS — N2581 Secondary hyperparathyroidism of renal origin: Secondary | ICD-10-CM | POA: Diagnosis present

## 2024-03-04 DIAGNOSIS — Z8249 Family history of ischemic heart disease and other diseases of the circulatory system: Secondary | ICD-10-CM

## 2024-03-04 DIAGNOSIS — Z880 Allergy status to penicillin: Secondary | ICD-10-CM

## 2024-03-04 DIAGNOSIS — I4892 Unspecified atrial flutter: Secondary | ICD-10-CM | POA: Diagnosis present

## 2024-03-04 DIAGNOSIS — E1122 Type 2 diabetes mellitus with diabetic chronic kidney disease: Secondary | ICD-10-CM | POA: Diagnosis present

## 2024-03-04 DIAGNOSIS — K148 Other diseases of tongue: Secondary | ICD-10-CM

## 2024-03-04 DIAGNOSIS — Z7984 Long term (current) use of oral hypoglycemic drugs: Secondary | ICD-10-CM

## 2024-03-04 DIAGNOSIS — Z79899 Other long term (current) drug therapy: Secondary | ICD-10-CM

## 2024-03-04 DIAGNOSIS — I4891 Unspecified atrial fibrillation: Secondary | ICD-10-CM | POA: Diagnosis present

## 2024-03-04 DIAGNOSIS — N186 End stage renal disease: Secondary | ICD-10-CM | POA: Diagnosis present

## 2024-03-04 DIAGNOSIS — E8721 Acute metabolic acidosis: Secondary | ICD-10-CM | POA: Diagnosis present

## 2024-03-04 DIAGNOSIS — G4733 Obstructive sleep apnea (adult) (pediatric): Secondary | ICD-10-CM | POA: Diagnosis present

## 2024-03-04 DIAGNOSIS — D631 Anemia in chronic kidney disease: Secondary | ICD-10-CM | POA: Diagnosis present

## 2024-03-04 DIAGNOSIS — I2510 Atherosclerotic heart disease of native coronary artery without angina pectoris: Secondary | ICD-10-CM | POA: Diagnosis present

## 2024-03-04 DIAGNOSIS — Z905 Acquired absence of kidney: Secondary | ICD-10-CM

## 2024-03-04 DIAGNOSIS — M24541 Contracture, right hand: Secondary | ICD-10-CM | POA: Diagnosis present

## 2024-03-04 DIAGNOSIS — Z7901 Long term (current) use of anticoagulants: Secondary | ICD-10-CM

## 2024-03-04 DIAGNOSIS — I69351 Hemiplegia and hemiparesis following cerebral infarction affecting right dominant side: Secondary | ICD-10-CM

## 2024-03-04 DIAGNOSIS — M4722 Other spondylosis with radiculopathy, cervical region: Secondary | ICD-10-CM | POA: Diagnosis present

## 2024-03-04 LAB — RETICULOCYTES
Immature Retic Fract: 13.1 % (ref 2.3–15.9)
RBC.: 2.45 MIL/uL — ABNORMAL LOW (ref 4.22–5.81)
Retic Count, Absolute: 39 K/uL (ref 19.0–186.0)
Retic Ct Pct: 1.6 % (ref 0.4–3.1)

## 2024-03-04 LAB — CBC WITH DIFFERENTIAL/PLATELET
Abs Immature Granulocytes: 0.03 K/uL (ref 0.00–0.07)
Basophils Absolute: 0.1 K/uL (ref 0.0–0.1)
Basophils Relative: 1 %
Eosinophils Absolute: 0.3 K/uL (ref 0.0–0.5)
Eosinophils Relative: 4 %
HCT: 24 % — ABNORMAL LOW (ref 39.0–52.0)
Hemoglobin: 7.7 g/dL — ABNORMAL LOW (ref 13.0–17.0)
Immature Granulocytes: 0 %
Lymphocytes Relative: 29 %
Lymphs Abs: 2 K/uL (ref 0.7–4.0)
MCH: 30.2 pg (ref 26.0–34.0)
MCHC: 32.1 g/dL (ref 30.0–36.0)
MCV: 94.1 fL (ref 80.0–100.0)
Monocytes Absolute: 0.5 K/uL (ref 0.1–1.0)
Monocytes Relative: 8 %
Neutro Abs: 4.1 K/uL (ref 1.7–7.7)
Neutrophils Relative %: 58 %
Platelets: 177 K/uL (ref 150–400)
RBC: 2.55 MIL/uL — ABNORMAL LOW (ref 4.22–5.81)
RDW: 14.8 % (ref 11.5–15.5)
WBC: 7 K/uL (ref 4.0–10.5)
nRBC: 0 % (ref 0.0–0.2)

## 2024-03-04 LAB — COMPREHENSIVE METABOLIC PANEL WITH GFR
ALT: 10 U/L (ref 0–44)
AST: 14 U/L — ABNORMAL LOW (ref 15–41)
Albumin: 2.7 g/dL — ABNORMAL LOW (ref 3.5–5.0)
Alkaline Phosphatase: 58 U/L (ref 38–126)
Anion gap: 14 (ref 5–15)
BUN: 91 mg/dL — ABNORMAL HIGH (ref 8–23)
CO2: 18 mmol/L — ABNORMAL LOW (ref 22–32)
Calcium: 8.3 mg/dL — ABNORMAL LOW (ref 8.9–10.3)
Chloride: 108 mmol/L (ref 98–111)
Creatinine, Ser: 5.55 mg/dL — ABNORMAL HIGH (ref 0.61–1.24)
GFR, Estimated: 10 mL/min — ABNORMAL LOW (ref >60)
Glucose, Bld: 141 mg/dL — ABNORMAL HIGH (ref 70–99)
Potassium: 4.9 mmol/L (ref 3.5–5.1)
Sodium: 140 mmol/L (ref 135–145)
Total Bilirubin: 0.5 mg/dL (ref 0.0–1.2)
Total Protein: 6.4 g/dL — ABNORMAL LOW (ref 6.5–8.1)

## 2024-03-04 LAB — FERRITIN: Ferritin: 36 ng/mL (ref 24–336)

## 2024-03-04 LAB — PREPARE RBC (CROSSMATCH)

## 2024-03-04 LAB — IRON AND TIBC
Iron: 33 ug/dL — ABNORMAL LOW (ref 45–182)
Saturation Ratios: 13 % — ABNORMAL LOW (ref 17.9–39.5)
TIBC: 253 ug/dL (ref 250–450)
UIBC: 220 ug/dL

## 2024-03-04 LAB — TROPONIN I (HIGH SENSITIVITY)
Troponin I (High Sensitivity): 22 ng/L — ABNORMAL HIGH (ref <18)
Troponin I (High Sensitivity): 23 ng/L — ABNORMAL HIGH (ref <18)

## 2024-03-04 LAB — BRAIN NATRIURETIC PEPTIDE: B Natriuretic Peptide: 652 pg/mL — ABNORMAL HIGH (ref 0.0–100.0)

## 2024-03-04 LAB — CBG MONITORING, ED
Glucose-Capillary: 124 mg/dL — ABNORMAL HIGH (ref 70–99)
Glucose-Capillary: 122 mg/dL — ABNORMAL HIGH (ref 70–99)

## 2024-03-04 LAB — LIPASE, BLOOD: Lipase: 47 U/L (ref 11–51)

## 2024-03-04 LAB — GLUCOSE, CAPILLARY: Glucose-Capillary: 109 mg/dL — ABNORMAL HIGH (ref 70–99)

## 2024-03-04 LAB — TSH: TSH: 2.363 u[IU]/mL (ref 0.350–4.500)

## 2024-03-04 LAB — PHOSPHORUS: Phosphorus: 6.1 mg/dL — ABNORMAL HIGH (ref 2.5–4.6)

## 2024-03-04 LAB — MAGNESIUM: Magnesium: 1.5 mg/dL — ABNORMAL LOW (ref 1.7–2.4)

## 2024-03-04 MED ORDER — DIGOXIN 0.25 MG/ML IJ SOLN
0.1250 mg | Freq: Once | INTRAMUSCULAR | Status: DC
  Filled 2024-03-04: qty 2

## 2024-03-04 MED ORDER — SODIUM BICARBONATE 650 MG PO TABS
650.0000 mg | ORAL_TABLET | Freq: Three times a day (TID) | ORAL | Status: DC
Start: 2024-03-04 — End: 2024-03-09
  Administered 2024-03-04 – 2024-03-08 (×13): 650 mg via ORAL
  Filled 2024-03-04 (×13): qty 1

## 2024-03-04 MED ORDER — CARVEDILOL 6.25 MG PO TABS: 6.2500 mg | ORAL_TABLET | Freq: Two times a day (BID) | ORAL | Status: DC

## 2024-03-04 MED ORDER — APIXABAN 5 MG PO TABS
5.0000 mg | ORAL_TABLET | Freq: Two times a day (BID) | ORAL | Status: DC
  Administered 2024-03-04 – 2024-03-11 (×14): 5 mg via ORAL
  Filled 2024-03-04 (×15): qty 1

## 2024-03-04 MED ORDER — MAGNESIUM SULFATE IN D5W 1-5 GM/100ML-% IV SOLN
1.0000 g | Freq: Once | INTRAVENOUS | Status: AC
  Administered 2024-03-04: 1 g via INTRAVENOUS
  Filled 2024-03-04: qty 100

## 2024-03-04 MED ORDER — ACETAMINOPHEN 325 MG PO TABS: 650.0000 mg | ORAL_TABLET | ORAL | Status: DC | PRN

## 2024-03-04 MED ORDER — METOLAZONE 2.5 MG PO TABS: 2.5000 mg | ORAL_TABLET | Freq: Every day | ORAL | Status: DC

## 2024-03-04 MED ORDER — SODIUM CHLORIDE 0.9 % IV SOLN
300.0000 mg | Freq: Once | INTRAVENOUS | Status: AC
  Administered 2024-03-04: 300 mg via INTRAVENOUS
  Filled 2024-03-04: qty 300

## 2024-03-04 MED ORDER — METHOCARBAMOL 500 MG PO TABS
750.0000 mg | ORAL_TABLET | Freq: Four times a day (QID) | ORAL | Status: DC | PRN
Start: 2024-03-04 — End: 2024-03-13

## 2024-03-04 MED ORDER — INSULIN ASPART 100 UNIT/ML IJ SOLN
0.0000 [IU] | Freq: Three times a day (TID) | INTRAMUSCULAR | Status: DC
  Administered 2024-03-04 (×2): 1 [IU] via SUBCUTANEOUS
  Administered 2024-03-05: 3 [IU] via SUBCUTANEOUS
  Administered 2024-03-06 (×2): 2 [IU] via SUBCUTANEOUS
  Administered 2024-03-07: 3 [IU] via SUBCUTANEOUS
  Administered 2024-03-08: 2 [IU] via SUBCUTANEOUS
  Administered 2024-03-09: 1 [IU] via SUBCUTANEOUS
  Administered 2024-03-09: 3 [IU] via SUBCUTANEOUS
  Administered 2024-03-10 (×2): 2 [IU] via SUBCUTANEOUS
  Administered 2024-03-11 (×3): 1 [IU] via SUBCUTANEOUS
  Administered 2024-03-12: 3 [IU] via SUBCUTANEOUS
  Filled 2024-03-04 (×15): qty 1

## 2024-03-04 MED ORDER — BUDESON-GLYCOPYRROL-FORMOTEROL 160-9-4.8 MCG/ACT IN AERO
2.0000 | INHALATION_SPRAY | Freq: Two times a day (BID) | RESPIRATORY_TRACT | Status: DC
  Administered 2024-03-07: 2 via RESPIRATORY_TRACT
  Filled 2024-03-04: qty 5.9

## 2024-03-04 MED ORDER — LORAZEPAM 0.5 MG PO TABS: 0.5000 mg | ORAL_TABLET | Freq: Four times a day (QID) | ORAL | Status: DC | PRN

## 2024-03-04 MED ORDER — SODIUM CHLORIDE 0.9% IV SOLUTION: Freq: Once | INTRAVENOUS | Status: DC

## 2024-03-04 MED ORDER — HYDROCODONE-ACETAMINOPHEN 5-325 MG PO TABS
1.0000 | ORAL_TABLET | Freq: Once | ORAL | Status: AC
  Administered 2024-03-04: 1 via ORAL
  Filled 2024-03-04: qty 1

## 2024-03-04 MED ORDER — CARVEDILOL 6.25 MG PO TABS: 3.1250 mg | ORAL_TABLET | Freq: Two times a day (BID) | ORAL | Status: DC

## 2024-03-04 MED ORDER — ONDANSETRON HCL 4 MG/2ML IJ SOLN: 4.0000 mg | Freq: Four times a day (QID) | INTRAMUSCULAR | Status: DC | PRN

## 2024-03-04 MED ORDER — FUROSEMIDE 10 MG/ML IJ SOLN
40.0000 mg | Freq: Once | INTRAMUSCULAR | Status: AC
  Administered 2024-03-04: 40 mg via INTRAVENOUS
  Filled 2024-03-04: qty 4

## 2024-03-04 MED ORDER — ROSUVASTATIN CALCIUM 10 MG PO TABS
10.0000 mg | ORAL_TABLET | Freq: Every day | ORAL | Status: DC
  Administered 2024-03-04 – 2024-03-11 (×8): 10 mg via ORAL
  Filled 2024-03-04 (×9): qty 1

## 2024-03-04 MED ORDER — SODIUM CHLORIDE 0.9% FLUSH: 3.0000 mL | INTRAVENOUS | Status: DC | PRN

## 2024-03-04 MED ORDER — ISOSORBIDE MONONITRATE ER 30 MG PO TB24
30.0000 mg | ORAL_TABLET | Freq: Every day | ORAL | Status: DC
  Administered 2024-03-04 – 2024-03-12 (×8): 30 mg via ORAL
  Filled 2024-03-04 (×8): qty 1

## 2024-03-04 MED ORDER — LIDOCAINE 5 % EX PTCH
1.0000 | MEDICATED_PATCH | CUTANEOUS | Status: DC
  Filled 2024-03-04 (×9): qty 1

## 2024-03-04 MED ORDER — INSULIN ASPART 100 UNIT/ML IJ SOLN
0.0000 [IU] | Freq: Every day | INTRAMUSCULAR | Status: DC
  Administered 2024-03-11: 2 [IU] via SUBCUTANEOUS
  Filled 2024-03-04: qty 1

## 2024-03-04 MED ORDER — HYDRALAZINE HCL 10 MG PO TABS
10.0000 mg | ORAL_TABLET | Freq: Three times a day (TID) | ORAL | Status: DC
Start: 2024-03-04 — End: 2024-03-13
  Administered 2024-03-04 – 2024-03-12 (×25): 10 mg via ORAL
  Filled 2024-03-04 (×26): qty 1

## 2024-03-04 MED ORDER — METOPROLOL TARTRATE 5 MG/5ML IV SOLN
5.0000 mg | INTRAVENOUS | Status: DC | PRN
Start: 2024-03-04 — End: 2024-03-13

## 2024-03-04 MED ORDER — PANTOPRAZOLE SODIUM 40 MG PO TBEC
40.0000 mg | DELAYED_RELEASE_TABLET | Freq: Every day | ORAL | Status: DC
  Administered 2024-03-04 – 2024-03-12 (×7): 40 mg via ORAL
  Filled 2024-03-04 (×7): qty 1

## 2024-03-04 MED ORDER — DIGOXIN 0.25 MG/ML IJ SOLN
0.1250 mg | Freq: Four times a day (QID) | INTRAMUSCULAR | Status: AC
Start: 2024-03-04 — End: 2024-03-04
  Administered 2024-03-04: 0.125 mg via INTRAVENOUS
  Filled 2024-03-04 (×2): qty 2

## 2024-03-04 MED ORDER — IPRATROPIUM BROMIDE 0.02 % IN SOLN: 0.5000 mg | RESPIRATORY_TRACT | Status: DC | PRN

## 2024-03-04 MED ORDER — SODIUM CHLORIDE 0.9% FLUSH
3.0000 mL | Freq: Two times a day (BID) | INTRAVENOUS | Status: DC
  Administered 2024-03-04 – 2024-03-05 (×2): 3 mL via INTRAVENOUS

## 2024-03-04 MED ORDER — FUROSEMIDE 10 MG/ML IJ SOLN
40.0000 mg | Freq: Two times a day (BID) | INTRAMUSCULAR | Status: DC
  Administered 2024-03-04 – 2024-03-08 (×8): 40 mg via INTRAVENOUS
  Filled 2024-03-04 (×8): qty 4

## 2024-03-04 MED ORDER — SODIUM CHLORIDE 0.9 % IV SOLN: 250.0000 mL | INTRAVENOUS | Status: DC | PRN

## 2024-03-04 MED ORDER — CARVEDILOL 12.5 MG PO TABS
12.5000 mg | ORAL_TABLET | Freq: Two times a day (BID) | ORAL | Status: DC
  Administered 2024-03-04 – 2024-03-08 (×8): 12.5 mg via ORAL
  Filled 2024-03-04 (×4): qty 1
  Filled 2024-03-04: qty 2
  Filled 2024-03-04 (×3): qty 1

## 2024-03-04 NOTE — ED Provider Notes (Signed)
 Care of this patient assumed from prior physician at 1500 pending additional labs and disposition. Please see prior physician note for further details.  68 year old male with history of T2DM, CKD 4, HTN, anemia of chronic disease, CHF presenting with right shoulder pain.  Signed out to me pending lab work, reevaluation, disposition.  Lab work did demonstrate significant renal impairment with BUN of 91 and creatinine of 5.55.  Reviewed recent visit with nephrology from 02/26/2024.  At that time he reported that he would not want renal replacement therapy should he develop worsening symptoms.  Creatinine at the time of 5.6.  Also noted to have anemia of chronic disease with hemoglobin of 9.7 at that time.  It was noted that if hemoglobin remains less than 10 to consider hematology referral.  Hemoglobin 7.7 today, denies acute bleeding sources.  BNP elevated at 652, historically elevated but is higher than recent priors.  This may be partly related to his worsening CKD.  X-Vash Quezada with pulmonary vascular congestion.  On exam, patient is awake, interactive.  Continues to report some right shoulder discomfort.  Says it feels like he sprained it, but does not remember any specific injury.  He is satting at 100% on room air.  He denies any shortness of breath or difficulty when laying flat.  Clinically without evidence of acute CHF exacerbation.  Takes Lasix  at home, will hold off on additional diuretics at this time.  EKG demonstrates sinus rhythm at a rate of 79, PR 293, QRS 87, QTc 436, nonspecific ST changes. Initial troponin 22, repeat 23.   0950 When I went to reevaluate the patient, I noted that his alarm started going off and he was noted to have heart rates in the 140s, awake, talking.  This episode did self terminate.  I did go back and review patient's telemetry.  Over the last couple hours he has had at least 5 episodes of intermittent arrhythmia.  Does appear to be a narrow complex tachycardia with a heart  rate around 140 each of these times, P waves not clearly discernible, concerning for atrial flutter. Reviewed cardiology visit from 08/31/2023.  At that time patient was noted to have atrial flutter s/p ablation, continued on Eliquis  and Coreg  at that time.  Patient tells me that he does not think that he has had atrial flutter since that time though he is a limited historian.  With this history, I am concerned that some of patient's intermittent shoulder pain may actually be related to an arrhythmia.  Some consideration for possible pulmonary embolism, but patient with poor renal function here with a GFR of 10, and patient has reported that he does not wish to pursue hemodialysis in the past, so we will defer on CTA for now.  Consideration for VQ scan as an inpatient.  His labs show a minimally elevated troponin at 22-23, stable.  His BNP is up, suspect this may be contributing to his arrhythmia.  Given that he is symptomatic, do think it would be reasonable to admit him for diuresis, further evaluation of his arrhythmia and right-sided chest pain.  Ordered for IV Lasix .  Will reach out to hospitalist team to discuss admission.  1008 Case discussed with Dr. Laurita. He will evaluate for anticipated admission.    Larry Slate, MD 03/04/24 1009

## 2024-03-04 NOTE — H&P (Signed)
 History and Physical    Larry Callahan FMW:[id number] DOB: Oct 09, 1955 DOA: 03/04/2024  PCP: [facility] (Confirm with patient/family/NH records and if not entered, this has to be entered at [facility] point of entry) Patient coming from: SNF  I have personally briefly reviewed patient's old medical records in [facility]  Chief Complaint: Right shoulder and right arm pain, SOB  HPI: Larry Callahan is a 68 y.o. male with medical history significant of chronic HFrEF and HFpEF with improved LVEF from 40% to 50% recently, a flutter status post ablation on Eliquis , CKD stage IV HTN, IDDM, COPD, nonobstructive CAD, CVA with residual right-sided weakness, sent from nursing home for evaluation of new onset of right shoulder and right upper arm pain and shortness of breath.  Symptoms started 2 days ago, patient started to have intermittent right-sided neck pain right shoulder pain and right upper arm pain, shooting like, worsening with neck movement.  Last night the pain became constant.  Denied any chest pain.  Meantime he was complaining about intermittent exertional dyspnea and ankle swelling bilaterally.  He does take Lasix  once a day.  ED Course: Afebrile, in A-fib in adult rapid A-fib, blood pressure elevated 150-170/90, chest x-ray showed pulmonary congestion, EKG showed sinus rhythm versus PAF, blood work showed BUN 91 creatinine 5.5 compared to baseline 2.8-3.4 hemoglobin 7.7 compared to baseline 8.0-9.0 WBC 7.0.  Patient was given IV Lasix  40 mg x 1 in the ED.  Review of Systems: As per HPI otherwise 14 point review of systems negative.    Past Medical History:  Diagnosis Date   Allergy    Atrial flutter (HCC)    a. Dx 12/2020--CHA2DS2VASc = 5-->eliquis .   Chronic combined systolic (congestive) and diastolic (congestive) heart failure (HCC)    a. 04/2019 Echo: EF 45-50%; b. 02/2019 Echo: EF 55-60%; c. 09/2020 Echo: EF 35-40%, glob HK. Nl RV size/fxn. Mild MR; d.  07/2021 Echo: EF 40-45%, mild LVH, low-nl RV fxn, no MR.   CKD (chronic kidney disease), stage III (HCC)    COPD (chronic obstructive pulmonary disease) (HCC)    Coronary artery disease    a. 2011 Cath: nonobs dzs; b. 09/2020 MV: EF 38%. No signif ischemia. ? inf MI vs diaph attenuation. GI uptake artifact.   Diabetes mellitus without complication (HCC)    GERD (gastroesophageal reflux disease)    History of CVA (cerebrovascular accident) 09/25/2020   Hyperlipidemia    Hypertension    Morbid (severe) obesity due to excess calories (HCC) 06/09/2017   Morbid obesity (HCC)    NICM (nonischemic cardiomyopathy) (HCC)    a. 04/2019 Echo: EF 45-50%; b. 02/2019 Echo: EF 55-60%; c. 09/2020 Echo: EF 35-40%, glob HK; d. 09/2020 MV: No ischemia. ? inf infarct vs attenuation; d. 07/2021 Echo: EF 40-45%.   OSA (obstructive sleep apnea)    Pneumonia due to COVID-19 virus 01/30/2020   Stage 3b chronic kidney disease (HCC)    Stroke (HCC) 2024   Syncope and collapse 11/16/2020    Past Surgical History:  Procedure Laterality Date   CARDIAC CATHETERIZATION     CHOLECYSTECTOMY     COLONOSCOPY WITH PROPOFOL  N/A 04/12/2019   Procedure: COLONOSCOPY WITH PROPOFOL ;  Surgeon: Unk Corinn Skiff, MD;  Location: [name] ENDOSCOPY;  Service: Gastroenterology;  Laterality: N/A;   ESOPHAGOGASTRODUODENOSCOPY N/A 04/12/2019   Procedure: ESOPHAGOGASTRODUODENOSCOPY (EGD);  Surgeon: Unk Corinn Skiff, MD;  Location: [facility] ENDOSCOPY;  Service: Gastroenterology;  Laterality: N/A;   ESOPHAGOGASTRODUODENOSCOPY (EGD) WITH PROPOFOL  N/A 11/21/2019  Procedure: ESOPHAGOGASTRODUODENOSCOPY (EGD) WITH PROPOFOL ;  Surgeon: Unk Corinn Skiff, MD;  Location: [facility] ENDOSCOPY;  Service: Gastroenterology;  Laterality: N/A;   ESOPHAGOGASTRODUODENOSCOPY (EGD) WITH PROPOFOL  N/A 01/11/2022   Procedure: ESOPHAGOGASTRODUODENOSCOPY (EGD) WITH PROPOFOL ;  Surgeon: Jinny Carmine, MD;  Location: [name] ENDOSCOPY;  Service: Endoscopy;  Laterality: N/A;    ESOPHAGOGASTRODUODENOSCOPY (EGD) WITH PROPOFOL  N/A 08/02/2022   Procedure: ESOPHAGOGASTRODUODENOSCOPY (EGD) WITH PROPOFOL ;  Surgeon: Jinny Carmine, MD;  Location: [name] ENDOSCOPY;  Service: Endoscopy;  Laterality: N/A;   FLEXIBLE BRONCHOSCOPY Bilateral 05/17/2019   Procedure: FLEXIBLE BRONCHOSCOPY;  Surgeon: Fernand Elfreda LABOR, MD;  Location: [name] ORS;  Service: Pulmonary;  Laterality: Bilateral;   left arm surgery     nephrectomy Left    PARATHYROIDECTOMY     RIGHT HEART CATH N/A 03/29/2019   Procedure: RIGHT HEART CATH;  Surgeon: Perla Evalene PARAS, MD;  Location: [name] INVASIVE CV LAB;  Service: Cardiovascular;  Laterality: N/A;     reports that he has been smoking cigarettes. He has quit using smokeless tobacco.  His smokeless tobacco use included chew. He reports that he does not drink alcohol and does not use drugs.  Allergies  Allergen Reactions   Penicillins Rash, Dermatitis, Hives and Other (See Comments)    Other reaction(s): HIVES   Codeine  Nausea And Vomiting    Family History  Problem Relation Age of Onset   Diabetes Mother        2003   Lung cancer Father    Diabetes Sister    Hypertension Sister    Heart disease Sister    Cancer Sister    Bone cancer Brother    Prostate cancer Neg Hx    Bladder Cancer Neg Hx    Kidney cancer Neg Hx      Prior to Admission medications   Medication Sig Start Date End Date Taking? Authorizing Provider  ACCU-CHEK GUIDE test strip Use to check blood sugars 4 times a day E11.65 03/08/23   Liana Fish, NP  Accu-Chek Softclix Lancets lancets Use to check blood sugars 4 times a day   E11.65 08/20/21   Liana Fish, NP  acetaminophen  (TYLENOL ) 325 MG tablet Take 2 tablets (650 mg total) by mouth every 6 (six) hours as needed for mild pain or moderate pain (or Fever >/= 101). 08/03/22   Von Bellis, MD  albuterol  (VENTOLIN  HFA) 108 (90 Base) MCG/ACT inhaler Inhale 1-2 puffs into the lungs every 4 (four) hours as needed for wheezing or  shortness of breath. 10/12/23   Alexander, Natalie, DO  apixaban  (ELIQUIS ) 5 MG TABS tablet Take 1 tablet (5 mg total) by mouth 2 (two) times daily. 10/05/23   Liana Fish, NP  Blood Glucose Monitoring Suppl (TRUE METRIX AIR GLUCOSE METER) DEVI 1 Device by Does not apply route 2 (two) times daily. 04/13/18   Boscia, Heather E, NP  budeson-glycopyrrolate -formoterol  (BREZTRI  AEROSPHERE) 160-9-4.8 MCG/ACT AERO inhaler Inhale 2 puffs into the lungs 2 (two) times daily. 10/05/23   Liana Fish, NP  carvedilol  (COREG ) 3.125 MG tablet Take 1 tablet (3.125 mg total) by mouth 2 (two) times daily with a meal. 11/29/23   Josette Ade, MD  cyanocobalamin  1000 MCG tablet Take 1 tablet (1,000 mcg total) by mouth daily. 10/05/23   Liana Fish, NP  empagliflozin  (JARDIANCE ) 10 MG TABS tablet Take 1 tablet (10 mg total) by mouth daily. 12/19/23   Josette Ade, MD  ferrous sulfate  325 (65 FE) MG tablet Take 1 tablet (325 mg total) by mouth every other day. 12/19/23  Josette Ade, MD  furosemide  (LASIX ) 40 MG tablet Take 1 tablet (40 mg total) by mouth daily. 10/05/23   Liana Fish, NP  insulin  aspart (NOVOLOG ) 100 UNIT/ML injection Inject 2 Units into the skin 3 (three) times daily with meals. If eating and Blood Glucose (BG) 80 or higher inject 2 units for meal coverage and add correction dose per scale. If not eating, correction dose only. BG <150= 0 unit; BG 150-200= 1 unit; BG 201-250= 2 unit; BG 251-300= 3 unit; BG 301-350= 4 unit; BG 351-400= 5 unit; BG >400= 6 unit and Call Primary Care. 11/29/23   Josette Ade, MD  irbesartan  (AVAPRO ) 75 MG tablet Take 1 tablet (75 mg total) by mouth daily. 12/19/23   Josette Ade, MD  methocarbamol  (ROBAXIN ) 750 MG tablet Take 1 tablet (750 mg total) by mouth every 6 (six) hours as needed for muscle spasms. 10/05/23   Abernathy, Alyssa, NP  metolazone  (ZAROXOLYN ) 2.5 MG tablet Take once weekly on Tuesdays 11/13/23   Dezii, Alexandra, DO  pantoprazole   (PROTONIX ) 40 MG tablet Take 1 tablet (40 mg total) by mouth daily. 11/13/23 12/13/23  Dezii, Alexandra, DO  rosuvastatin  (CRESTOR ) 10 MG tablet Take 1 tablet (10 mg total) by mouth at bedtime. 09/18/23   Liana Fish, NP  sodium bicarbonate  650 MG tablet Take 1 tablet (650 mg total) by mouth 3 (three) times daily. 11/29/23   Josette Ade, MD    Physical Exam: Vitals:   03/04/24 0800 03/04/24 0830 03/04/24 0900 03/04/24 1009  BP: (!) 174/89 (!) 144/95 (!) 153/81   Pulse: 74 (!) 108 73   Resp: 20 16 (!) 21   Temp:    98 F (36.7 C)  TempSrc:    Oral  SpO2: 100% 100% 100%   Weight:        Constitutional: NAD, calm, comfortable Vitals:   03/04/24 0800 03/04/24 0830 03/04/24 0900 03/04/24 1009  BP: (!) 174/89 (!) 144/95 (!) 153/81   Pulse: 74 (!) 108 73   Resp: 20 16 (!) 21   Temp:    98 F (36.7 C)  TempSrc:    Oral  SpO2: 100% 100% 100%   Weight:       Eyes: PERRL, lids and conjunctivae normal ENMT: Mucous membranes are moist. Posterior pharynx clear of any exudate or lesions.Normal dentition.  Neck: normal, supple, no masses, no thyromegaly Respiratory: clear to auscultation bilaterally, no wheezing, no crackles. Normal respiratory effort. No accessory muscle use.  Cardiovascular: Irregular heart rate, no murmurs / rubs / gallops.  2+ extremity edema. 2+ pedal pulses. No carotid bruits.  Abdomen: no tenderness, no masses palpated. No hepatosplenomegaly. Bowel sounds positive.  Musculoskeletal: Tenderness on right shoulder muscle and deltoid and decreased light touch sensation of right shoulder and right upper arm Skin: no rashes, lesions, ulcers. No induration Neurologic: CN 2-12 grossly intact. Sensation intact, DTR normal. Strength 5/5 in all 4.  Psychiatric: Normal judgment and insight. Alert and oriented x 3. Normal mood.     Labs on Admission: I have personally reviewed following labs and imaging studies  CBC: Recent Labs  Lab 03/04/24 0619  WBC 7.0  NEUTROABS  4.1  HGB 7.7*  HCT 24.0*  MCV 94.1  PLT 177   Basic Metabolic Panel: Recent Labs  Lab 03/04/24 0619  NA 140  K 4.9  CL 108  CO2 18*  GLUCOSE 141*  BUN 91*  CREATININE 5.55*  CALCIUM  8.3*   GFR: Estimated Creatinine Clearance: 13.2 mL/min (A) (by C-G  formula based on SCr of 5.55 mg/dL (H)). Liver Function Tests: Recent Labs  Lab 03/04/24 0619  AST 14*  ALT 10  ALKPHOS 58  BILITOT 0.5  PROT 6.4*  ALBUMIN  2.7*   Recent Labs  Lab 03/04/24 0619  LIPASE 47   No results for input(s): AMMONIA in the last 168 hours. Coagulation Profile: No results for input(s): INR, PROTIME in the last 168 hours. Cardiac Enzymes: No results for input(s): CKTOTAL, CKMB, CKMBINDEX, TROPONINI in the last 168 hours. BNP (last 3 results) No results for input(s): PROBNP in the last 8760 hours. HbA1C: No results for input(s): HGBA1C in the last 72 hours. CBG: No results for input(s): GLUCAP in the last 168 hours. Lipid Profile: No results for input(s): CHOL, HDL, LDLCALC, TRIG, CHOLHDL, LDLDIRECT in the last 72 hours. Thyroid  Function Tests: No results for input(s): TSH, T4TOTAL, FREET4, T3FREE, THYROIDAB in the last 72 hours. Anemia Panel: No results for input(s): VITAMINB12, FOLATE, FERRITIN, TIBC, IRON , RETICCTPCT in the last 72 hours. Urine analysis:    Component Value Date/Time   COLORURINE STRAW (A) 09/17/2022 1432   APPEARANCEUR CLEAR (A) 09/17/2022 1432   APPEARANCEUR Clear 03/29/2022 1504   LABSPEC 1.009 09/17/2022 1432   LABSPEC 1.005 03/06/2013 0929   PHURINE 5.0 09/17/2022 1432   GLUCOSEU NEGATIVE 09/17/2022 1432   GLUCOSEU Negative 03/06/2013 0929   HGBUR NEGATIVE 09/17/2022 1432   BILIRUBINUR NEGATIVE 09/17/2022 1432   BILIRUBINUR Negative 03/29/2022 1504   BILIRUBINUR Negative 03/06/2013 0929   KETONESUR NEGATIVE 09/17/2022 1432   PROTEINUR 100 (A) 09/17/2022 1432   UROBILINOGEN 0.2 02/20/2019 1404   NITRITE  NEGATIVE 09/17/2022 1432   LEUKOCYTESUR NEGATIVE 09/17/2022 1432   LEUKOCYTESUR Negative 03/06/2013 0929    Radiological Exams on Admission: DG Shoulder Right Portable Result Date: 03/04/2024 EXAM: 1 VIEW XRAY OF THE RIGHT SHOULDER 03/04/2024 06:23:17 AM COMPARISON: None available. CLINICAL HISTORY: Chest and right shoulder pain. Patient arrived to ED from [facility] for right shoulder pain that radiates into arm. History of stroke before with right sided deficits, contractures. EMS gave 324 aspirin  and 2 nitroglycerin  sprays. CBG 186. Systolic BP was 190's. Patient alert and oriented times 3. Disoriented to year. FINDINGS: BONES AND JOINTS: Glenohumeral joint is normally aligned. No acute fracture or dislocation. Mild degenerative changes of the acromioclavicular joint. SOFT TISSUES: No abnormal calcifications. Visualized lung is unremarkable. IMPRESSION: 1. No acute findings. 2. Mild degenerative changes of the acromioclavicular joint. Electronically signed by: Waddell Calk MD 03/04/2024 06:31 AM EDT RP Workstation: [id number]   DG Chest Port 1 View Result Date: 03/04/2024 EXAM: 1 VIEW XRAY OF THE CHEST 03/04/2024 06:23:17 AM COMPARISON: 12/17/2023 CLINICAL HISTORY: Chest and right shoulder pain. Patient arrived to ED from [facility] for right shoulder pain that radiates into arm. History of stroke before with right sided deficits, contractures. EMS gave 324 aspirin  and 2 nitroglycerin  sprays. CBG 186. Systolic BP was 190's. Patient alert and oriented times 3. Disoriented to year. FINDINGS: LUNGS AND PLEURA: Increased pulmonary vascular congestion. No focal pulmonary opacity. No pleural effusion. No pneumothorax. HEART AND MEDIASTINUM: Aortic atherosclerosis. No acute abnormality of the cardiac and mediastinal silhouettes. BONES AND SOFT TISSUES: No acute osseous abnormality. IMPRESSION: 1. Increased pulmonary vascular congestion. 2. Aortic atherosclerosis. Electronically signed by: Waddell Calk MD 03/04/2024 06:29 AM EDT RP Workstation: [id number]    EKG: Independently reviewed.  A-fib with RVR, no acute ST changes.  Assessment/Plan Principal Problem:   CHF (congestive heart failure) (HCC) Active Problems:  Acute on chronic HFrEF (heart failure with reduced ejection fraction) (HCC)   Atrial flutter (HCC)  (please populate well all problems here in Problem List. (For example, if patient is on BP meds at home and you resume or decide to hold them, it is a problem that needs to be her. Same for CAD, COPD, HLD and so on)  Acute on chronic combined HFrEF and HFpEF decompensation - Significant fluid overload on physical exam and x-ray. - Reason for decompensation is probably multifactorial from worsening of anemia as well as probably frequent breakthrough of A-fib with RVR. - Continue IV Lasix  40 mg twice daily, and monitor kidney function closely - Echocardiogram was done on the recent admission, will not repeat echocardiogram at this time.  A-fib with RVR - Given patient is in rapid A-fib, will give digoxin  125 mcg every 6 hours x 2 - Given that patient also has concurrent uncontrolled hypertension, will increase his Coreg  dosage from 3.125 to 6.25 mg twice daily - Continue Eliquis   Right shoulder pain/right upper arm pain - Clinically suspect pinched nerve from underlying cervical spinal radiculopathy - Cervical spine x-ray sent - Refer patient to see outpatient neurosurgery/orthopedic surgery for possible injection. - Other DDx, patient on Eliquis , low suspicion for PE.  Hold off further imaging study.  Also, patient has a history of nonobstructive CAD, low suspicion for angina.  AKI on CKD stage IV - Volume overload related, clinically suspect cardiorenal syndrome - Diuresis as above - Patient stated that no matter what happened, he does not wish to have hemodialysis.  Confirmed to this patient that he is DNR/DNI.  Acute on chronic anemia secondary to  CKD Symptomatic anemia - Patient refused PRBC and all other blood products - Check iron  study and reticulocyte count, will consider IV iron  versus EPO infusion if indicated.  IDDM -SSI  HTN, uncontrolled - Hold off ARB given the research of kidney function - Increasing Coreg  dosage - Start Imdur  + hydralazine  regimen  COPD - No acute concern, change as needed albuterol  to Atrovent  given doorstop uncontrolled A-fib,  Hx of stroke with residual right-sided weakness and right hand contracture - No new neurological deficits, continue Eliquis  and statin  Total time spent on patient care 75 minutes  DVT prophylaxis: Eliquis  Code Status: DNR/DNI Family Communication: None at bedside Disposition Plan: Expect less than 2 midnight hospital stay Consults called: None Admission status: Telemetry observation   Cort ONEIDA Mana MD [facility] Pager [phone]  03/04/2024, 10:43 AM

## 2024-03-04 NOTE — ED Triage Notes (Signed)
 Pt arrived to ED from [facility] for right shoulder pain that radiates into arm. Hx of stroke before with right sided deficits, contractures. EMS gave 324 aspirin  and 2 nitroglycerin  sprays. CBG 186. Systolic BP was 190's. Pt A&Ox3. Disoriented to year

## 2024-03-04 NOTE — ED Provider Notes (Signed)
 [facility] Provider Note    Event Date/Time   First MD Initiated Contact with Patient 03/04/24 [phone]     (approximate)   History   Arm Pain and Shoulder Pain   HPI Larry Callahan is a 68 y.o. 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  He presents tonight by EMS for right shoulder and right sided chest pain.  He said that it woke him up from sleep and then got better so he went back to sleep.  The next time he woke up he was sweating and it hurt very badly in the right shoulder where it meets his chest.  It is better now.  He was hypertensive for EMS and they gave him several sprays of nitroglycerin  which improved his blood pressure.      Physical Exam   Triage Vital Signs: ED Triage Vitals  Encounter Vitals Group     BP 03/04/24 0603 (!) 156/88     Girls Systolic BP Percentile --      Girls Diastolic BP Percentile --      Boys Systolic BP Percentile --      Boys Diastolic BP Percentile --      Pulse Rate 03/04/24 0603 82     Resp 03/04/24 0603 18     Temp 03/04/24 0603 97.8 F (36.6 C)     Temp src --      SpO2 03/04/24 0603 100 %     Weight --      Height --      Head Circumference --      Peak Flow --      Pain Score 03/04/24 0604 2     Pain Loc --      Pain Education --      Exclude from Growth Chart --     Most recent vital signs: Vitals:   03/04/24 0603  BP: (!) 156/88  Pulse: 82  Resp: 18  Temp: 97.8 F (36.6 C)  SpO2: 100%    General: Awake, no distress.  Appears chronically ill but not acutely so. CV:  Good peripheral perfusion.  Regular rate and rhythm, normal heart sounds, easily palpable right radial pulse. Resp:  Normal effort. Speaking easily and comfortably, no accessory muscle usage nor intercostal retractions.  Lungs are clear to auscultation  bilaterally. Abd:  No distention.  No tenderness to palpation of the abdomen. MSK:  Patient has no palpable tenderness to palpation of his right shoulder.  He has chronic contractures of the right hand but otherwise he says that his range of motion is at baseline.  He can fully flex and extend and abduct and adduct his arm and shoulder without pain.  There is no palpable nor visible deformity. Other:  The patient has a lesion on the right side of his tongue.  He states that he had 1 on the left and that his doctor is aware of the 1 on the right and is in the process of making an appointment with ENT for him to follow-up because he says that a doctor told him it might be cancer.  He said this is not why I am here but I want to show you     ED Results / Procedures / Treatments   Labs (all labs ordered are listed, but only abnormal results are  displayed) Labs Reviewed  CBC WITH DIFFERENTIAL/PLATELET - Abnormal; Notable for the following components:      Result Value   RBC 2.55 (*)    Hemoglobin 7.7 (*)    HCT 24.0 (*)    All other components within normal limits  COMPREHENSIVE METABOLIC PANEL WITH GFR  LIPASE, BLOOD  BRAIN NATRIURETIC PEPTIDE  TROPONIN I (HIGH SENSITIVITY)     EKG  ED ECG REPORT I, Darleene Dome, the attending physician, personally viewed and interpreted this ECG.  Date: 03/04/2024 EKG Time: 0602 Rate: 79 Rhythm: sinus with first degree AV block QRS Axis: normal Intervals: PR interval 223 ms ST/T Wave abnormalities: Non-specific ST segment / T-wave changes, but no clear evidence of acute ischemia. Narrative Interpretation: no definitive evidence of acute ischemia; does not meet STEMI criteria.    RADIOLOGY See ED course for details   PROCEDURES:  Critical Care performed: No  .1-3 Lead EKG Interpretation  Performed by: Dome Darleene, MD Authorized by: Dome Darleene, MD     Interpretation: normal     ECG rate:  80   ECG rate assessment: normal      Rhythm: sinus rhythm     Ectopy: none     Conduction: normal       IMPRESSION / MDM / ASSESSMENT AND PLAN / ED COURSE  I reviewed the triage vital signs and the nursing notes.                              Differential diagnosis includes, but is not limited to, musculoskeletal pain/strain, ACS, radiculopathy, vascular impairment.  Patient's presentation is most consistent with acute presentation with potential threat to life or bodily function.  Labs/studies ordered: CBC with differential, BNP, CMP, lipase, right shoulder x-ray, portable chest x-ray, EKG  Interventions/Medications given:  Medications - No data to display  (Note:  hospital course my include additional interventions and/or labs/studies not listed above.)   Strongly suspect musculoskeletal pain, but it is currently not reproducible and the patient reports that during the second time he woke up he was having diaphoresis associated with the chest and shoulder pain.  Definitely worth checking 2 troponins assuming the first troponin will be negative and monitoring him for additional episodes of pain.  He is currently not hurting.  EKG shows no evidence of ischemia at this time.  Patient understands the plan for further evaluation.  The patient is on the cardiac monitor to evaluate for evidence of arrhythmia and/or significant heart rate changes.   Clinical Course as of 03/04/24 0701  Mon Mar 04, 2024  9363 I independently viewed and interpreted the patient's right shoulder x-ray as well as the right chest x-ray.  No indication of fracture or dislocation of the shoulder or of any rib injuries.  There is some fluffiness consistent with pulmonary vascular congestion, confirmed by radiologist. [CF]  9363 CBC with Differential(!) Essentially normal CBC other than anemia which is slightly lower hemoglobin than usual but within about 1 point. [CF]  9298 Transferring ED care to Dr. Levander. [CF]    Clinical Course User Index [CF]  Dome Darleene, MD     FINAL CLINICAL IMPRESSION(S) / ED DIAGNOSES   Final diagnoses:  Acute pain of right shoulder  Tongue lesion     Rx / DC Orders   ED Discharge Orders     None        Note:  This document was prepared using Dragon voice  recognition software and may include unintentional dictation errors.   Gordan Huxley, MD 03/04/24 [phone]

## 2024-03-04 NOTE — ED Notes (Signed)
 Pt BIB EMS states that he is having shoulder pain that radiates into his chest. States its a aching feeling.  Pt was changed out of his wet clothes and pants and wallet in a belonging bag.

## 2024-03-05 ENCOUNTER — Encounter: Payer: Self-pay | Admitting: Internal Medicine

## 2024-03-05 DIAGNOSIS — I5023 Acute on chronic systolic (congestive) heart failure: Secondary | ICD-10-CM | POA: Diagnosis not present

## 2024-03-05 LAB — BASIC METABOLIC PANEL WITH GFR
Anion gap: 11 (ref 5–15)
BUN: 89 mg/dL — ABNORMAL HIGH (ref 8–23)
CO2: 18 mmol/L — ABNORMAL LOW (ref 22–32)
Calcium: 8.2 mg/dL — ABNORMAL LOW (ref 8.9–10.3)
Chloride: 109 mmol/L (ref 98–111)
Creatinine, Ser: 5.38 mg/dL — ABNORMAL HIGH (ref 0.61–1.24)
GFR, Estimated: 11 mL/min — ABNORMAL LOW (ref >60)
Glucose, Bld: 113 mg/dL — ABNORMAL HIGH (ref 70–99)
Potassium: 4.6 mmol/L (ref 3.5–5.1)
Sodium: 138 mmol/L (ref 135–145)

## 2024-03-05 LAB — GLUCOSE, CAPILLARY
Glucose-Capillary: 78 mg/dL (ref 70–99)
Glucose-Capillary: 207 mg/dL — ABNORMAL HIGH (ref 70–99)
Glucose-Capillary: 117 mg/dL — ABNORMAL HIGH (ref 70–99)
Glucose-Capillary: 184 mg/dL — ABNORMAL HIGH (ref 70–99)

## 2024-03-05 LAB — PHOSPHORUS: Phosphorus: 6.4 mg/dL — ABNORMAL HIGH (ref 2.5–4.6)

## 2024-03-05 LAB — MAGNESIUM: Magnesium: 1.7 mg/dL (ref 1.7–2.4)

## 2024-03-05 NOTE — Care Management Obs Status (Signed)
 MEDICARE OBSERVATION STATUS NOTIFICATION   Patient Details  Name: Larry Callahan MRN: [id number] Date of Birth: March 16, 1956   Medicare Observation Status Notification Given:  Yes    Christoher Drudge W, CMA 03/05/2024, 12:01 PM

## 2024-03-05 NOTE — NC FL2 (Signed)
 [place]  MEDICAID FL2 LEVEL OF CARE FORM     IDENTIFICATION  Patient Name: Larry Callahan Birthdate: Sep 02, 1955 Sex: male Admission Date (Current Location): 03/04/2024  [place] and [place] Number:  Chiropodist and Address:  [facility], [address] [place], [place] [address]      Provider Number: [id number]  Attending Physician Name and Address:  Jerelene Critchley, MD  Relative Name and Phone Number:       Current Level of Care: Hospital Recommended Level of Care: Skilled Nursing Facility Prior Approval Number:    Date Approved/Denied:   PASRR Number: [id number]  Discharge Plan: SNF    Current Diagnoses: Patient Active Problem List   Diagnosis Date Noted   CHF (congestive heart failure) (HCC) 03/04/2024   Left facial swelling 12/04/2023   Housing situation unstable 11/29/2023   Tongue lesion 11/29/2023   Type II diabetes mellitus with renal manifestations (HCC) 11/22/2023   CAD (coronary artery disease) 11/22/2023   CHF exacerbation (HCC) 11/06/2023   Acute on chronic HFrEF (heart failure with reduced ejection fraction) (HCC) 11/04/2023   Acute kidney injury superimposed on CKD 11/04/2023   COPD exacerbation (HCC) 10/14/2023   Fall at home, initial encounter 10/14/2023   COPD with acute exacerbation (HCC) 10/10/2023   Metabolic acidosis, normal anion gap (NAG) 10/10/2023   Positive D dimer 09/23/2022   Hemoptysis 09/23/2022   Left arm numbness 09/13/2022   Acute upper GI bleed 07/31/2022   Chest pain 07/31/2022   Hematoma of left flank 07/31/2022   Acute on chronic anemia 07/31/2022   Renal mass 07/31/2022   Upper GI bleed    Dizziness and giddiness 01/04/2022   Umbilical hernia 01/04/2022   Acute on chronic systolic CHF (congestive heart failure) (HCC) 08/05/2021   Acquired thrombophilia 06/28/2021   Anemia of chronic kidney failure 06/28/2021   PAD (peripheral artery disease)    Cellulitis of right arm  03/02/2021   Atrial flutter (HCC) 01/07/2021   Demand ischemia (HCC)    Influenza A 10/30/2020   HLD (hyperlipidemia) 10/06/2020   Hx of completed stroke 09/25/2020   Gastroesophageal reflux disease without esophagitis 04/19/2020   Elevated troponin I level 01/29/2020   Hematemesis 01/09/2020   Hospital discharge follow-up 11/26/2019   Peripheral neuropathy 08/11/2019   Obesity (BMI 30-39.9) 08/08/2019   Acute right hip pain 06/17/2019   Inability to ambulate due to hip 06/17/2019   Hypervolemia    Full code status 05/20/2019   Pleuritic chest pain    Acute on chronic heart failure with preserved ejection fraction (HFpEF) (HCC)    Chronic systolic CHF (congestive heart failure) (HCC) 03/01/2019   Iron  deficiency anemia 12/31/2018   Atopic dermatitis 11/24/2018   Screening for colon cancer 11/06/2017   Type 2 diabetes mellitus (HCC) 11/06/2017   Hidradenitis 06/09/2017   Atherosclerotic heart disease of native coronary artery without angina pectoris 06/09/2017   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   Obstructive sleep apnea, adult 06/09/2017   Cigarette nicotine  dependence with nicotine -induced disorder 06/09/2017   Diabetic polyneuropathy associated with type 2 diabetes mellitus (HCC) 06/09/2017   Allergic rhinitis due to pollen 06/09/2017   Mixed hyperlipidemia 06/09/2017   COPD (chronic obstructive pulmonary disease) (HCC) 06/09/2017   Wheezing 06/09/2017   Dysuria 06/09/2017   Essential hypertension 06/09/2017   Personal history of other malignant neoplasm of kidney 06/09/2017   Pain in right hip 06/09/2017   Secondary hyperparathyroidism, not elsewhere classified 06/09/2017   Tinea corporis  06/09/2017    Orientation RESPIRATION BLADDER Height & Weight     Self, Time, Situation, Place  Normal Continent Weight: 190 lb 11.2 oz (86.5 kg) Height:  5' 11 (180.3 cm)  BEHAVIORAL SYMPTOMS/MOOD NEUROLOGICAL BOWEL NUTRITION STATUS   (None)   (None) Incontinent Diet (Heart healthy/carb modified)  AMBULATORY STATUS COMMUNICATION OF NEEDS Skin     Verbally Wound Vac (Blister. Wound on vertebral column (foam).)                       Personal Care Assistance Level of Assistance              Functional Limitations Info  Sight, Hearing, Speech Sight Info: Adequate Hearing Info: Adequate Speech Info: Adequate    SPECIAL CARE FACTORS FREQUENCY                       Contractures Contractures Info: Not present    Additional Factors Info  Code Status, Allergies Code Status Info: DNR Allergies Info: Penicillins, Codeine            Current Medications (03/05/2024):  This is the current hospital active medication list Current Facility-Administered Medications  Medication Dose Route Frequency Provider Last Rate Last Admin   acetaminophen  (TYLENOL ) tablet 650 mg  650 mg Oral Q4H PRN Laurita Manor T, MD       apixaban  (ELIQUIS ) tablet 5 mg  5 mg Oral BID Laurita Manor T, MD   5 mg at 03/05/24 9066   budesonide -glycopyrrolate -formoterol  (BREZTRI ) 160-9-4.8 MCG/ACT inhaler 2 puff  2 puff Inhalation BID Laurita Manor T, MD       carvedilol  (COREG ) tablet 12.5 mg  12.5 mg Oral BID WC Zhang, Ping T, MD   12.5 mg at 03/05/24 9066   furosemide  (LASIX ) injection 40 mg  40 mg Intravenous BID Laurita Manor T, MD   40 mg at 03/05/24 0932   hydrALAZINE  (APRESOLINE ) tablet 10 mg  10 mg Oral Q8H Laurita Manor T, MD   10 mg at 03/05/24 1351   insulin  aspart (novoLOG ) injection 0-5 Units  0-5 Units Subcutaneous QHS Laurita Manor T, MD       insulin  aspart (novoLOG ) injection 0-9 Units  0-9 Units Subcutaneous TID WC Laurita Manor T, MD   3 Units at 03/05/24 1226   ipratropium (ATROVENT ) nebulizer solution 0.5 mg  0.5 mg Nebulization Q4H PRN Laurita Manor T, MD       isosorbide  mononitrate (IMDUR ) 24 hr tablet 30 mg  30 mg Oral Daily Zhang, Ping T, MD   30 mg at 03/05/24 9066   lidocaine  (LIDODERM ) 5 % 1 patch  1 patch Transdermal Q24H Laurita Manor  T, MD       LORazepam  (ATIVAN ) tablet 0.5 mg  0.5 mg Oral Q6H PRN Laurita Manor T, MD       methocarbamol  (ROBAXIN ) tablet 750 mg  750 mg Oral Q6H PRN Laurita Manor T, MD       metoprolol  tartrate (LOPRESSOR ) injection 5 mg  5 mg Intravenous Q4H PRN Laurita Manor T, MD       ondansetron  (ZOFRAN ) injection 4 mg  4 mg Intravenous Q6H PRN Laurita Manor T, MD       pantoprazole  (PROTONIX ) EC tablet 40 mg  40 mg Oral Daily Zhang, Ping T, MD   40 mg at 03/05/24 0933   rosuvastatin  (CRESTOR ) tablet 10 mg  10 mg Oral QHS Laurita Manor DASEN, MD   10 mg at 03/04/24 2119  sodium bicarbonate  tablet 650 mg  650 mg Oral TID Laurita Manor T, MD   650 mg at 03/05/24 9062   sodium chloride  flush (NS) 0.9 % injection 3 mL  3 mL Intravenous Q12H Laurita Manor T, MD   3 mL at 03/05/24 9062   sodium chloride  flush (NS) 0.9 % injection 3 mL  3 mL Intravenous PRN Laurita Manor DASEN, MD         Discharge Medications: Please see discharge summary for a list of discharge medications.  Relevant Imaging Results:  Relevant Lab Results:   Additional Information SS#: [id number]  Lauraine JAYSON Carpen, LCSW

## 2024-03-05 NOTE — Plan of Care (Signed)
  Problem: Education: Goal: Ability to describe self-care measures that may prevent or decrease complications (Diabetes Survival Skills Education) will improve Outcome: Progressing Goal: Individualized Educational Video(s) Outcome: Progressing   Problem: Fluid Volume: Goal: Ability to maintain a balanced intake and output will improve Outcome: Progressing   Problem: Metabolic: Goal: Ability to maintain appropriate glucose levels will improve Outcome: Progressing   Problem: Nutritional: Goal: Maintenance of adequate nutrition will improve Outcome: Progressing Goal: Progress toward achieving an optimal weight will improve Outcome: Progressing   Problem: Skin Integrity: Goal: Risk for impaired skin integrity will decrease Outcome: Progressing   Problem: Health Behavior/Discharge Planning: Goal: Ability to manage health-related needs will improve Outcome: Progressing   Problem: Clinical Measurements: Goal: Ability to maintain clinical measurements within normal limits will improve Outcome: Progressing Goal: Will remain free from infection Outcome: Progressing Goal: Diagnostic test results will improve Outcome: Progressing Goal: Respiratory complications will improve Outcome: Progressing Goal: Cardiovascular complication will be avoided Outcome: Progressing   Problem: Nutrition: Goal: Adequate nutrition will be maintained Outcome: Progressing   Problem: Coping: Goal: Level of anxiety will decrease Outcome: Progressing   Problem: Elimination: Goal: Will not experience complications related to bowel motility Outcome: Progressing Goal: Will not experience complications related to urinary retention Outcome: Progressing

## 2024-03-05 NOTE — Progress Notes (Signed)
 [facility]  ROUNDING NOTE   Subjective:   Larry Callahan is a 68 y.o. male with past medical conditions including CHF, COPD, CAD, stroke, diabetes, CKDIV and hypertension, who was admitted for CHF (congestive heart failure) (HCC) [I50.9] Tongue lesion [K14.8] Acute pain of right shoulder [M25.511]  Patient is known to our practice and is followed by Dr. Marcelino.  Patient was last seen in our office on 9/15 for routine follow-up.  Patient presents to the emergency department from [facility] for evaluation of shoulder pain.  He denies recent falls.  Patient was noted to have significant generalized edema and abnormal renal labs.  Labs on ED arrival concerning for serum bicarb 18, BUN 91, creatinine 5.65 with GFR 10, phosphorus 6.1, magnesium  1.5, BNP 652, troponin 22 and hemoglobin 77.  Chest x-ray shows increased pulmonary vascular congestion while right shoulder imaging negative for acute findings.  We have been consulted to evaluate acute kidney injury.   Objective:  Vital signs in last 24 hours:  Temp:  [97.7 F (36.5 C)-98.4 F (36.9 C)] 98.1 F (36.7 C) (09/23 1213) Pulse Rate:  [67-92] 67 (09/23 1213) Resp:  [9-21] 19 (09/23 1213) BP: (115-153)/(63-118) 125/69 (09/23 1213) SpO2:  [95 %-100 %] 100 % (09/23 1213) Weight:  [86.5 kg] 86.5 kg (09/23 0503)  Weight change: 0.363 kg Filed Weights   03/04/24 0604 03/04/24 2118 03/05/24 0503  Weight: 86.2 kg 86.5 kg 86.5 kg    Intake/Output: I/O last 3 completed shifts: In: -  Out: 400 [Urine:400]   Intake/Output this shift:  Total I/O In: 480 [P.O.:480] Out: 975 [Urine:975]  Physical Exam: General: NAD  Head: Normocephalic, atraumatic. Moist oral mucosal membranes  Eyes: Anicteric  Lungs:  Clear to auscultation, normal effort  Heart: Regular rate and rhythm  Abdomen:  Soft, nontender  Extremities: 2+ peripheral edema.  Neurologic: Awake, alert, conversant  Skin: Warm,dry, no rash  Access:  None    Basic Metabolic Panel: Recent Labs  Lab 03/04/24 0619 03/05/24 0353  NA 140 138  K 4.9 4.6  CL 108 109  CO2 18* 18*  GLUCOSE 141* 113*  BUN 91* 89*  CREATININE 5.55* 5.38*  CALCIUM  8.3* 8.2*  MG 1.5* 1.7  PHOS 6.1* 6.4*    Liver Function Tests: Recent Labs  Lab 03/04/24 0619  AST 14*  ALT 10  ALKPHOS 58  BILITOT 0.5  PROT 6.4*  ALBUMIN  2.7*   Recent Labs  Lab 03/04/24 0619  LIPASE 47   No results for input(s): AMMONIA in the last 168 hours.  CBC: Recent Labs  Lab 03/04/24 0619  WBC 7.0  NEUTROABS 4.1  HGB 7.7*  HCT 24.0*  MCV 94.1  PLT 177    Cardiac Enzymes: No results for input(s): CKTOTAL, CKMB, CKMBINDEX, TROPONINI in the last 168 hours.  BNP: Invalid input(s): POCBNP  CBG: Recent Labs  Lab 03/04/24 1438 03/04/24 1844 03/04/24 2116 03/05/24 0800 03/05/24 1212  GLUCAP 124* 122* 109* 78 207*    Microbiology: Results for orders placed or performed during the hospital encounter of 11/22/23  Resp panel by RT-PCR (RSV, Flu A&B, Covid) Anterior Nasal Swab     Status: None   Collection Time: 11/22/23  8:26 PM   Specimen: Anterior Nasal Swab  Result Value Ref Range Status   SARS Coronavirus 2 by RT PCR NEGATIVE NEGATIVE Final    Comment: (NOTE) SARS-CoV-2 target nucleic acids are NOT DETECTED.  The SARS-CoV-2 RNA is generally detectable in upper respiratory specimens during the  acute phase of infection. The lowest concentration of SARS-CoV-2 viral copies this assay can detect is 138 copies/mL. A negative result does not preclude SARS-Cov-2 infection and should not be used as the sole basis for treatment or other patient management decisions. A negative result may occur with  improper specimen collection/handling, submission of specimen other than nasopharyngeal swab, presence of viral mutation(s) within the areas targeted by this assay, and inadequate number of viral copies(<138 copies/mL). A negative result must be  combined with clinical observations, patient history, and epidemiological information. The expected result is Negative.  Fact Sheet for Patients:  BloggerCourse.com  Fact Sheet for Healthcare Providers:  SeriousBroker.it  This test is no t yet approved or cleared by the [place]  FDA and  has been authorized for detection and/or diagnosis of SARS-CoV-2 by FDA under an Emergency Use Authorization (EUA). This EUA will remain  in effect (meaning this test can be used) for the duration of the COVID-19 declaration under Section 564(b)(1) of the Act, 21 U.S.C.section 360bbb-3(b)(1), unless the authorization is terminated  or revoked sooner.       Influenza A by PCR NEGATIVE NEGATIVE Final   Influenza B by PCR NEGATIVE NEGATIVE Final    Comment: (NOTE) The Xpert Xpress SARS-CoV-2/FLU/RSV plus assay is intended as an aid in the diagnosis of influenza from Nasopharyngeal swab specimens and should not be used as a sole basis for treatment. Nasal washings and aspirates are unacceptable for Xpert Xpress SARS-CoV-2/FLU/RSV testing.  Fact Sheet for Patients: BloggerCourse.com  Fact Sheet for Healthcare Providers: SeriousBroker.it  This test is not yet approved or cleared by the [place]  FDA and has been authorized for detection and/or diagnosis of SARS-CoV-2 by FDA under an Emergency Use Authorization (EUA). This EUA will remain in effect (meaning this test can be used) for the duration of the COVID-19 declaration under Section 564(b)(1) of the Act, 21 U.S.C. section 360bbb-3(b)(1), unless the authorization is terminated or revoked.     Resp Syncytial Virus by PCR NEGATIVE NEGATIVE Final    Comment: (NOTE) Fact Sheet for Patients: BloggerCourse.com  Fact Sheet for Healthcare Providers: SeriousBroker.it  This test is not yet  approved or cleared by the [place]  FDA and has been authorized for detection and/or diagnosis of SARS-CoV-2 by FDA under an Emergency Use Authorization (EUA). This EUA will remain in effect (meaning this test can be used) for the duration of the COVID-19 declaration under Section 564(b)(1) of the Act, 21 U.S.C. section 360bbb-3(b)(1), unless the authorization is terminated or revoked.  Performed at [facility], [address] [place], [place] [address]     Coagulation Studies: No results for input(s): LABPROT, INR in the last 72 hours.  Urinalysis: No results for input(s): COLORURINE, LABSPEC, PHURINE, GLUCOSEU, HGBUR, BILIRUBINUR, KETONESUR, PROTEINUR, UROBILINOGEN, NITRITE, LEUKOCYTESUR in the last 72 hours.  Invalid input(s): APPERANCEUR    Imaging: DG Cervical Spine 2 or 3 views Result Date: 03/04/2024 CLINICAL DATA:  Neck pain. EXAM: CERVICAL SPINE - 2-3 VIEW COMPARISON:  CT 07/31/2022 FINDINGS: Vertebral body alignment and heights are normal. There is moderate spondylosis of the cervical spine to include uncovertebral joint spurring and facet arthropathy. Minimal disc space narrowing at the C3-4 level. Prevertebral soft tissues are normal. IMPRESSION: Moderate spondylosis of the cervical spine with minimal disc disease at the C3-4 level. Electronically Signed   By: Toribio Agreste M.D.   On: 03/04/2024 12:33   DG Shoulder Right Portable Result Date: 03/04/2024 EXAM: 1 VIEW XRAY OF THE RIGHT SHOULDER  03/04/2024 06:23:17 AM COMPARISON: None available. CLINICAL HISTORY: Chest and right shoulder pain. Patient arrived to ED from [facility] for right shoulder pain that radiates into arm. History of stroke before with right sided deficits, contractures. EMS gave 324 aspirin  and 2 nitroglycerin  sprays. CBG 186. Systolic BP was 190's. Patient alert and oriented times 3. Disoriented to year. FINDINGS: BONES AND JOINTS: Glenohumeral joint is  normally aligned. No acute fracture or dislocation. Mild degenerative changes of the acromioclavicular joint. SOFT TISSUES: No abnormal calcifications. Visualized lung is unremarkable. IMPRESSION: 1. No acute findings. 2. Mild degenerative changes of the acromioclavicular joint. Electronically signed by: Waddell Calk MD 03/04/2024 06:31 AM EDT RP Workstation: [id number]   DG Chest Port 1 View Result Date: 03/04/2024 EXAM: 1 VIEW XRAY OF THE CHEST 03/04/2024 06:23:17 AM COMPARISON: 12/17/2023 CLINICAL HISTORY: Chest and right shoulder pain. Patient arrived to ED from [facility] for right shoulder pain that radiates into arm. History of stroke before with right sided deficits, contractures. EMS gave 324 aspirin  and 2 nitroglycerin  sprays. CBG 186. Systolic BP was 190's. Patient alert and oriented times 3. Disoriented to year. FINDINGS: LUNGS AND PLEURA: Increased pulmonary vascular congestion. No focal pulmonary opacity. No pleural effusion. No pneumothorax. HEART AND MEDIASTINUM: Aortic atherosclerosis. No acute abnormality of the cardiac and mediastinal silhouettes. BONES AND SOFT TISSUES: No acute osseous abnormality. IMPRESSION: 1. Increased pulmonary vascular congestion. 2. Aortic atherosclerosis. Electronically signed by: Taylor Stroud MD 03/04/2024 06:29 AM EDT RP Workstation: [id number]     Medications:     apixaban   5 mg Oral BID   budesonide -glycopyrrolate -formoterol   2 puff Inhalation BID   carvedilol   12.5 mg Oral BID WC   furosemide   40 mg Intravenous BID   hydrALAZINE   10 mg Oral Q8H   insulin  aspart  0-5 Units Subcutaneous QHS   insulin  aspart  0-9 Units Subcutaneous TID WC   isosorbide  mononitrate  30 mg Oral Daily   lidocaine   1 patch Transdermal Q24H   pantoprazole   40 mg Oral Daily   rosuvastatin   10 mg Oral QHS   sodium bicarbonate   650 mg Oral TID   sodium chloride  flush  3 mL Intravenous Q12H   acetaminophen , ipratropium, LORazepam , methocarbamol , metoprolol   tartrate, ondansetron  (ZOFRAN ) IV, sodium chloride  flush  Assessment/ Plan:  [name] is a 68 y.o.  male  with past medical conditions including CHF, COPD, CAD, stroke, diabetes, CKDIV and hypertension.  Patient has been admitted for CHF (congestive heart failure) (HCC) [I50.9] Tongue lesion [K14.8] Acute pain of right shoulder [M25.511]   Acute Kidney Injury on chronic kidney disease stage V with baseline creatinine 4.14 and GFR of 15 on 01/01/24.  Acute kidney injury secondary to cardiorenal syndrome.  Will order renal ultrasound to evaluate obstruction.  No acute indication for dialysis.  Will continue to monitor renal function during diuretic therapy.   Lab Results  Component Value Date   CREATININE 5.38 (H) 03/05/2024   CREATININE 5.55 (H) 03/04/2024   CREATININE 2.87 (H) 12/15/2023    Intake/Output Summary (Last 24 hours) at 03/05/2024 1444 Last data filed at 03/05/2024 1300 Gross per 24 hour  Intake 480 ml  Output 1375 ml  Net -895 ml    2.  Acute metabolic acidosis.  Serum bicarb 18 on ED arrival.  Primary team is ordered oral sodium bicarbonate  650 mg 3 times daily.  3.  Acute on chronic heart failure.  Most recent echo on 11/05/2023 shows EF 50 to 55% with grade  1 diastolic dysfunction and trivial MVR.  Chest x-ray shows pulmonary vascular congestion.  Patient is currently receiving IV furosemide  40 mg twice daily.  4. Anemia of chronic kidney disease Lab Results  Component Value Date   HGB 7.7 (L) 03/04/2024    Hemoglobin decreased, will consider ESA.   LOS: 0 Kimbly Eanes 9/23/20252:44 PM

## 2024-03-05 NOTE — TOC Initial Note (Signed)
 Transition of Care [facility]) - Initial/Assessment Note    Patient Details  Name: Larry Callahan MRN: [id number] Date of Birth: February 19, 1956  Transition of [facility]) CM/SW Contact:    Lauraine JAYSON Carpen, LCSW Phone Number: 03/05/2024, 2:12 PM  Clinical Narrative:  CSW met with patient. No family at bedside. CSW introduced role and explained that discharge planning would be discussed. Per chart review, patient admitted from [facility]. Patient and admissions coordinator confirmed that he is a long-term resident. No further concerns. CSW will continue to follow patient for support and facilitate return to SNF once medically stable.                Expected Discharge Plan: Skilled Nursing Facility Barriers to Discharge: Continued Medical Work up   Patient Goals and CMS Choice            Expected Discharge Plan and Services     Post Acute Care Choice: Resumption of Svcs/PTA Provider Living arrangements for the past 2 months: Skilled Nursing Facility                                      Prior Living Arrangements/Services Living arrangements for the past 2 months: Skilled Nursing Facility Lives with:: Facility Resident Patient language and need for interpreter reviewed:: Yes Do you feel safe going back to the place where you live?: Yes      Need for Family Participation in Patient Care: Yes (Comment) Care giver support system in place?: Yes (comment)   Criminal Activity/Legal Involvement Pertinent to Current Situation/Hospitalization: No - Comment as needed  Activities of Daily Living   ADL Screening (condition at time of admission) Independently performs ADLs?: No Does the patient have a NEW difficulty with bathing/dressing/toileting/self-feeding that is expected to last >3 days?: No Does the patient have a NEW difficulty with getting in/out of bed, walking, or climbing stairs that is expected to last >3 days?: No Does the patient have a NEW difficulty with  communication that is expected to last >3 days?: No Is the patient deaf or have difficulty hearing?: No Does the patient have difficulty seeing, even when wearing glasses/contacts?: No Does the patient have difficulty concentrating, remembering, or making decisions?: No  Permission Sought/Granted Permission sought to share information with : Facility Industrial/product designer granted to share information with : Yes, Verbal Permission Granted     Permission granted to share info w AGENCY: [facility]        Emotional Assessment Appearance:: Appears stated age Attitude/Demeanor/Rapport: Engaged, Gracious Affect (typically observed): Accepting, Appropriate, Calm, Pleasant Orientation: : Oriented to Self, Oriented to Place, Oriented to  Time, Oriented to Situation Alcohol / Substance Use: Not Applicable Psych Involvement: No (comment)  Admission diagnosis:  CHF (congestive heart failure) (HCC) [I50.9] Tongue lesion [K14.8] Acute pain of right shoulder [M25.511] Patient Active Problem List   Diagnosis Date Noted   CHF (congestive heart failure) (HCC) 03/04/2024   Left facial swelling 12/04/2023   Housing situation unstable 11/29/2023   Tongue lesion 11/29/2023   Type II diabetes mellitus with renal manifestations (HCC) 11/22/2023   CAD (coronary artery disease) 11/22/2023   CHF exacerbation (HCC) 11/06/2023   Acute on chronic HFrEF (heart failure with reduced ejection fraction) (HCC) 11/04/2023   Acute kidney injury superimposed on CKD 11/04/2023   COPD exacerbation (HCC) 10/14/2023   Fall at home, initial encounter 10/14/2023   COPD with  acute exacerbation (HCC) 10/10/2023   Metabolic acidosis, normal anion gap (NAG) 10/10/2023   Positive D dimer 09/23/2022   Hemoptysis 09/23/2022   Left arm numbness 09/13/2022   Acute upper GI bleed 07/31/2022   Chest pain 07/31/2022   Hematoma of left flank 07/31/2022   Acute on chronic anemia 07/31/2022   Renal mass  07/31/2022   Upper GI bleed    Dizziness and giddiness 01/04/2022   Umbilical hernia 01/04/2022   Acute on chronic systolic CHF (congestive heart failure) (HCC) 08/05/2021   Acquired thrombophilia 06/28/2021   Anemia of chronic kidney failure 06/28/2021   PAD (peripheral artery disease)    Cellulitis of right arm 03/02/2021   Atrial flutter (HCC) 01/07/2021   Demand ischemia (HCC)    Influenza A 10/30/2020   HLD (hyperlipidemia) 10/06/2020   Hx of completed stroke 09/25/2020   Gastroesophageal reflux disease without esophagitis 04/19/2020   Elevated troponin I level 01/29/2020   Hematemesis 01/09/2020   Hospital discharge follow-up 11/26/2019   Peripheral neuropathy 08/11/2019   Obesity (BMI 30-39.9) 08/08/2019   Acute right hip pain 06/17/2019   Inability to ambulate due to hip 06/17/2019   Hypervolemia    Full code status 05/20/2019   Pleuritic chest pain    Acute on chronic heart failure with preserved ejection fraction (HFpEF) (HCC)    Chronic systolic CHF (congestive heart failure) (HCC) 03/01/2019   Iron  deficiency anemia 12/31/2018   Atopic dermatitis 11/24/2018   Screening for colon cancer 11/06/2017   Type 2 diabetes mellitus (HCC) 11/06/2017   Hidradenitis 06/09/2017   Atherosclerotic heart disease of native coronary artery without angina pectoris 06/09/2017   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   Obstructive sleep apnea, adult 06/09/2017   Cigarette nicotine  dependence with nicotine -induced disorder 06/09/2017   Diabetic polyneuropathy associated with type 2 diabetes mellitus (HCC) 06/09/2017   Allergic rhinitis due to pollen 06/09/2017   Mixed hyperlipidemia 06/09/2017   COPD (chronic obstructive pulmonary disease) (HCC) 06/09/2017   Wheezing 06/09/2017   Dysuria 06/09/2017   Essential hypertension 06/09/2017   Personal history of other malignant neoplasm of kidney 06/09/2017   Pain in right hip 06/09/2017   Secondary  hyperparathyroidism, not elsewhere classified 06/09/2017   Tinea corporis 06/09/2017   PCP:  [employer]:   [facility] - [place], [place] - [address] [place] [address] Phone: [phone] Fax: [phone]     Social Drivers of Health (SDOH) Social History: SDOH Screenings   Food Insecurity: No Food Insecurity (03/04/2024)  Housing: Low Risk  (03/04/2024)  Transportation Needs: No Transportation Needs (03/04/2024)  Utilities: Not At Risk (03/04/2024)  Alcohol Screen: Low Risk  (03/29/2022)  Depression (PHQ2-9): Low Risk  (09/13/2022)  Financial Resource Strain: Low Risk  (11/16/2023)  Social Connections: Unknown (11/23/2023)  Tobacco Use: High Risk (12/26/2023)   SDOH Interventions:     Readmission Risk Interventions    12/01/2021   12:25 PM 08/07/2021    2:36 PM 06/28/2021    2:26 PM  Readmission Risk Prevention Plan  Transportation Screening Complete Complete Complete  Medication Review Oceanographer) Complete Complete Complete  PCP or Specialist appointment within 3-5 days of discharge Complete Complete Complete  HRI or Home Care Consult Complete Complete Complete  SW Recovery Care/Counseling Consult Complete Complete Complete  Palliative Care Screening Complete Complete Not Applicable  Skilled Nursing Facility Complete Complete Not Applicable

## 2024-03-05 NOTE — Progress Notes (Signed)
 PROGRESS NOTE    Larry Callahan  FMW:[id number] DOB: July 14, 1955 DOA: 03/04/2024 PCP: [place]  Chief Complaint  Patient presents with   Arm Pain   Shoulder Pain    Hospital Course:  Larry Callahan is a 68 y.o. male with medical history significant of chronic HFrEF and HFpEF with improved LVEF from 40% to 50% recently, a flutter status post ablation on Eliquis , CKD stage IV HTN, IDDM, COPD, nonobstructive CAD, CVA with residual right-sided weakness, sent from nursing home for evaluation of new onset of right shoulder and right upper arm pain and shortness of breath. Admitted for acute on chronic heart failure.   Subjective: Patient was examined at the bedside, comfortably sitting in the chair.  States SOB is improving, he needs to have dyspnea on exertion.  Right shoulder pain has resolved    Objective: Vitals:   03/04/24 2320 03/05/24 0309 03/05/24 0503 03/05/24 0800  BP: 120/63 125/67  (!) 145/74  Pulse: 92 76  81  Resp: 20 18  19   Temp: 98.4 F (36.9 C) 98.4 F (36.9 C)  98.2 F (36.8 C)  TempSrc:    Oral  SpO2: 95% 98%  100%  Weight:   86.5 kg   Height:        Intake/Output Summary (Last 24 hours) at 03/05/2024 0925 Last data filed at 03/05/2024 0850 Gross per 24 hour  Intake --  Output 900 ml  Net -900 ml   Filed Weights   03/04/24 0604 03/04/24 2118 03/05/24 0503  Weight: 86.2 kg 86.5 kg 86.5 kg    Examination: Constitutional: NAD, calm, comfortable  Neck: normal, supple, no masses, no thyromegaly Respiratory: clear to auscultation bilaterally, no wheezing, no crackles. Normal respiratory effort. No accessory muscle use.  Cardiovascular: RRR, no murmurs / rubs / gallops.  1+ extremity edema Abdomen: no tenderness, no masses palpated. No hepatosplenomegaly. Bowel sounds positive.  Musculoskeletal: Tenderness on right shoulder muscle and deltoid and decreased light touch sensation of right shoulder and right upper arm Skin: no  rashes, lesions, ulcers. No induration Neurologic: AO x3, No gross focal deficits  Assessment & Plan:  Acute on chronic combined HFrEF and HFpEF decompensation - Significant fluid overload on physical exam and x-ray - Reason for decompensation is probably multifactorial from worsening of anemia as well as probably frequent breakthrough of A-fib with RVR. -Troponin 22 -> 23, likely demand - Echo 05/25 EF 50 to 55%, no RWMA.  Grade I diastolic dysfunction - Continue IV Lasix  40 mg twice daily, and monitor kidney function closely - Daily weights, monitor I's/o   A-fib with RVR - s/p Digoxin  - Coreg  increased 12.5 mg bid - Continue Eliquis    Right shoulder pain/right upper arm pain - improved - Clinically suspect pinched nerve from underlying cervical spinal radiculopathy - XR C-spine: Moderate spondylosis of the cervical spine with minimal disc disease at the C3-C4 level - XR right shoulder: Mild degenerative changes of acromioclavicular joint - Outpatient follow up   AKI on CKD stage IV Metabolic acidosis - Volume overload related, clinically suspect cardiorenal syndrome - Cr 5.55 -> 5.38, Baseline Cr ~ 4.1 - Diuresis as above - Seen by Nephrology, appreciate recs - US  renal pending - Bicarb tabs - No indication for dialysis, also patient refused   Acute on chronic anemia secondary to CKD Symptomatic anemia - Patient refused PRBC and all other blood products - Check iron  study and reticulocyte count, will consider IV iron  versus EPO infusion if indicated.  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 - No new neurological deficits, continue Eliquis  and statin  DVT prophylaxis: Eliquis    Code Status: Limited: Do not attempt  resuscitation (DNR) -DNR-LIMITED -Do Not Intubate/DNI  Disposition:  LTC  Consultants:  Nephrology  Procedures:  None  Antimicrobials:  Anti-infectives (From admission, onward)    None       Data Reviewed: I have personally reviewed following labs and imaging studies CBC: Recent Labs  Lab 03/04/24 0619  WBC 7.0  NEUTROABS 4.1  HGB 7.7*  HCT 24.0*  MCV 94.1  PLT 177   Basic Metabolic Panel: Recent Labs  Lab 03/04/24 0619 03/05/24 0353  NA 140 138  K 4.9 4.6  CL 108 109  CO2 18* 18*  GLUCOSE 141* 113*  BUN 91* 89*  CREATININE 5.55* 5.38*  CALCIUM  8.3* 8.2*  MG 1.5*  --   PHOS 6.1*  --    GFR: Estimated Creatinine Clearance: 13.6 mL/min (A) (by C-G formula based on SCr of 5.38 mg/dL (H)). Liver Function Tests: Recent Labs  Lab 03/04/24 0619  AST 14*  ALT 10  ALKPHOS 58  BILITOT 0.5  PROT 6.4*  ALBUMIN  2.7*   CBG: Recent Labs  Lab 03/04/24 1438 03/04/24 1844 03/04/24 2116 03/05/24 0800  GLUCAP 124* 122* 109* 78    No results found for this or any previous visit (from the past 240 hours).   Radiology Studies: DG Cervical Spine 2 or 3 views Result Date: 03/04/2024 CLINICAL DATA:  Neck pain. EXAM: CERVICAL SPINE - 2-3 VIEW COMPARISON:  CT 07/31/2022 FINDINGS: Vertebral body alignment and heights are normal. There is moderate spondylosis of the cervical spine to include uncovertebral joint spurring and facet arthropathy. Minimal disc space narrowing at the C3-4 level. Prevertebral soft tissues are normal. IMPRESSION: Moderate spondylosis of the cervical spine with minimal disc disease at the C3-4 level. Electronically Signed   By: Toribio Agreste M.D.   On: 03/04/2024 12:33   DG Shoulder Right Portable Result Date: 03/04/2024 EXAM: 1 VIEW XRAY OF THE RIGHT SHOULDER 03/04/2024 06:23:17 AM COMPARISON: None available. CLINICAL HISTORY: Chest and right shoulder pain. Patient arrived to ED from [facility] for right shoulder pain that radiates into  arm. History of stroke before with right sided deficits, contractures. EMS gave 324 aspirin  and 2 nitroglycerin  sprays. CBG 186. Systolic BP was 190's. Patient alert and oriented times 3. Disoriented to year. FINDINGS: BONES AND JOINTS: Glenohumeral joint is normally aligned. No acute fracture or dislocation. Mild degenerative changes of the acromioclavicular joint. SOFT TISSUES: No abnormal calcifications. Visualized lung is unremarkable. IMPRESSION: 1. No acute findings. 2. Mild degenerative changes of the acromioclavicular joint. Electronically signed by: Waddell Calk MD 03/04/2024 06:31 AM EDT RP Workstation: [id number]   DG Chest Port 1 View Result Date: 03/04/2024 EXAM: 1 VIEW XRAY OF THE CHEST 03/04/2024 06:23:17 AM COMPARISON: 12/17/2023 CLINICAL HISTORY: Chest and right shoulder pain. Patient arrived to ED from [facility] for right shoulder pain that radiates into arm. History of stroke before with right sided deficits, contractures. EMS gave 324 aspirin  and 2 nitroglycerin  sprays. CBG 186. Systolic BP was 190's. Patient alert and oriented times 3. Disoriented to year. FINDINGS: LUNGS AND PLEURA: Increased pulmonary  vascular congestion. No focal pulmonary opacity. No pleural effusion. No pneumothorax. HEART AND MEDIASTINUM: Aortic atherosclerosis. No acute abnormality of the cardiac and mediastinal silhouettes. BONES AND SOFT TISSUES: No acute osseous abnormality. IMPRESSION: 1. Increased pulmonary vascular congestion. 2. Aortic atherosclerosis. Electronically signed by: Waddell Calk MD 03/04/2024 06:29 AM EDT RP Workstation: [id number]    Scheduled Meds:  apixaban   5 mg Oral BID   budesonide -glycopyrrolate -formoterol   2 puff Inhalation BID   carvedilol   12.5 mg Oral BID WC   furosemide   40 mg Intravenous BID   hydrALAZINE   10 mg Oral Q8H   insulin  aspart  0-5 Units Subcutaneous QHS   insulin  aspart  0-9 Units Subcutaneous TID WC   isosorbide  mononitrate  30 mg Oral Daily   lidocaine    1 patch Transdermal Q24H   pantoprazole   40 mg Oral Daily   rosuvastatin   10 mg Oral QHS   sodium bicarbonate   650 mg Oral TID   sodium chloride  flush  3 mL Intravenous Q12H   Continuous Infusions:  sodium chloride        LOS: 0 days  MDM: Patient is high risk for one or more organ failure.  They necessitate ongoing hospitalization for continued IV therapies and subsequent lab monitoring. Total time spent interpreting labs and vitals, reviewing the medical record, coordinating care amongst consultants and care team members, directly assessing and discussing care with the patient and/or family: 55 min Laree Lock, MD [facility]  To contact the attending physician between 7A-7P please use Epic Chat. To contact the covering physician during after hours 7P-7A, please review Amion.  03/05/2024, 9:25 AM   *This document has been created with the assistance of dictation software. Please excuse typographical errors. *

## 2024-03-06 ENCOUNTER — Inpatient Hospital Stay

## 2024-03-06 DIAGNOSIS — E785 Hyperlipidemia, unspecified: Secondary | ICD-10-CM | POA: Diagnosis present

## 2024-03-06 DIAGNOSIS — J449 Chronic obstructive pulmonary disease, unspecified: Secondary | ICD-10-CM | POA: Diagnosis present

## 2024-03-06 DIAGNOSIS — N2581 Secondary hyperparathyroidism of renal origin: Secondary | ICD-10-CM | POA: Diagnosis present

## 2024-03-06 DIAGNOSIS — N189 Chronic kidney disease, unspecified: Secondary | ICD-10-CM | POA: Diagnosis not present

## 2024-03-06 DIAGNOSIS — Z992 Dependence on renal dialysis: Secondary | ICD-10-CM | POA: Diagnosis not present

## 2024-03-06 DIAGNOSIS — Z9889 Other specified postprocedural states: Secondary | ICD-10-CM | POA: Diagnosis not present

## 2024-03-06 DIAGNOSIS — I2510 Atherosclerotic heart disease of native coronary artery without angina pectoris: Secondary | ICD-10-CM | POA: Diagnosis present

## 2024-03-06 DIAGNOSIS — I4892 Unspecified atrial flutter: Secondary | ICD-10-CM | POA: Diagnosis present

## 2024-03-06 DIAGNOSIS — Z8616 Personal history of COVID-19: Secondary | ICD-10-CM | POA: Diagnosis not present

## 2024-03-06 DIAGNOSIS — N179 Acute kidney failure, unspecified: Secondary | ICD-10-CM | POA: Diagnosis present

## 2024-03-06 DIAGNOSIS — I5084 End stage heart failure: Secondary | ICD-10-CM | POA: Diagnosis present

## 2024-03-06 DIAGNOSIS — I4891 Unspecified atrial fibrillation: Secondary | ICD-10-CM | POA: Diagnosis present

## 2024-03-06 DIAGNOSIS — E8721 Acute metabolic acidosis: Secondary | ICD-10-CM | POA: Diagnosis present

## 2024-03-06 DIAGNOSIS — E669 Obesity, unspecified: Secondary | ICD-10-CM | POA: Diagnosis present

## 2024-03-06 DIAGNOSIS — Z66 Do not resuscitate: Secondary | ICD-10-CM | POA: Diagnosis present

## 2024-03-06 DIAGNOSIS — G4733 Obstructive sleep apnea (adult) (pediatric): Secondary | ICD-10-CM | POA: Diagnosis present

## 2024-03-06 DIAGNOSIS — I132 Hypertensive heart and chronic kidney disease with heart failure and with stage 5 chronic kidney disease, or end stage renal disease: Secondary | ICD-10-CM | POA: Diagnosis present

## 2024-03-06 DIAGNOSIS — I428 Other cardiomyopathies: Secondary | ICD-10-CM | POA: Diagnosis present

## 2024-03-06 DIAGNOSIS — D631 Anemia in chronic kidney disease: Secondary | ICD-10-CM | POA: Diagnosis present

## 2024-03-06 DIAGNOSIS — K149 Disease of tongue, unspecified: Secondary | ICD-10-CM | POA: Diagnosis present

## 2024-03-06 DIAGNOSIS — I5043 Acute on chronic combined systolic (congestive) and diastolic (congestive) heart failure: Secondary | ICD-10-CM | POA: Diagnosis present

## 2024-03-06 DIAGNOSIS — I509 Heart failure, unspecified: Secondary | ICD-10-CM | POA: Diagnosis present

## 2024-03-06 DIAGNOSIS — Z794 Long term (current) use of insulin: Secondary | ICD-10-CM | POA: Diagnosis not present

## 2024-03-06 DIAGNOSIS — E1122 Type 2 diabetes mellitus with diabetic chronic kidney disease: Secondary | ICD-10-CM | POA: Diagnosis present

## 2024-03-06 DIAGNOSIS — I69351 Hemiplegia and hemiparesis following cerebral infarction affecting right dominant side: Secondary | ICD-10-CM | POA: Diagnosis not present

## 2024-03-06 DIAGNOSIS — M24541 Contracture, right hand: Secondary | ICD-10-CM | POA: Diagnosis present

## 2024-03-06 DIAGNOSIS — Z95828 Presence of other vascular implants and grafts: Secondary | ICD-10-CM | POA: Diagnosis not present

## 2024-03-06 DIAGNOSIS — N186 End stage renal disease: Secondary | ICD-10-CM | POA: Diagnosis present

## 2024-03-06 LAB — BASIC METABOLIC PANEL WITH GFR
Anion gap: 12 (ref 5–15)
BUN: 96 mg/dL — ABNORMAL HIGH (ref 8–23)
CO2: 20 mmol/L — ABNORMAL LOW (ref 22–32)
Calcium: 8.2 mg/dL — ABNORMAL LOW (ref 8.9–10.3)
Chloride: 108 mmol/L (ref 98–111)
Creatinine, Ser: 5.55 mg/dL — ABNORMAL HIGH (ref 0.61–1.24)
GFR, Estimated: 10 mL/min — ABNORMAL LOW (ref >60)
Glucose, Bld: 113 mg/dL — ABNORMAL HIGH (ref 70–99)
Potassium: 5 mmol/L (ref 3.5–5.1)
Sodium: 140 mmol/L (ref 135–145)

## 2024-03-06 LAB — CBC
HCT: 22.2 % — ABNORMAL LOW (ref 39.0–52.0)
Hemoglobin: 7 g/dL — ABNORMAL LOW (ref 13.0–17.0)
MCH: 29.3 pg (ref 26.0–34.0)
MCHC: 31.5 g/dL (ref 30.0–36.0)
MCV: 92.9 fL (ref 80.0–100.0)
Platelets: 167 K/uL (ref 150–400)
RBC: 2.39 MIL/uL — ABNORMAL LOW (ref 4.22–5.81)
RDW: 14.5 % (ref 11.5–15.5)
WBC: 6.2 K/uL (ref 4.0–10.5)
nRBC: 0 % (ref 0.0–0.2)

## 2024-03-06 LAB — MAGNESIUM: Magnesium: 1.7 mg/dL (ref 1.7–2.4)

## 2024-03-06 LAB — GLUCOSE, CAPILLARY
Glucose-Capillary: 199 mg/dL — ABNORMAL HIGH (ref 70–99)
Glucose-Capillary: 119 mg/dL — ABNORMAL HIGH (ref 70–99)
Glucose-Capillary: 189 mg/dL — ABNORMAL HIGH (ref 70–99)
Glucose-Capillary: 124 mg/dL — ABNORMAL HIGH (ref 70–99)

## 2024-03-06 NOTE — Progress Notes (Signed)
 [facility]  ROUNDING NOTE   Subjective:   Larry Callahan is a 68 y.o. male with past medical conditions including CHF, COPD, CAD, stroke, diabetes, CKDIV and hypertension, who was admitted for CHF (congestive heart failure) (HCC) [I50.9] Tongue lesion [K14.8] Acute pain of right shoulder [M25.511]  Patient is known to our practice and is followed by Dr. Marcelino.  Patient was last seen in our office on 9/15 for routine follow-up.    Update Patient seen sitting up in chair Currently eating breakfast Continues to complain of right shoulder pain Room air   Objective:  Vital signs in last 24 hours:  Temp:  [97.7 F (36.5 C)-98.9 F (37.2 C)] 97.9 F (36.6 C) (09/24 1205) Pulse Rate:  [69-79] 74 (09/24 1205) Resp:  [18-19] 18 (09/24 0409) BP: (124-154)/(60-99) 126/99 (09/24 1205) SpO2:  [99 %-100 %] 100 % (09/24 1205) Weight:  [86 kg] 86 kg (09/24 0517)  Weight change: -0.546 kg Filed Weights   03/04/24 2118 03/05/24 0503 03/06/24 0517  Weight: 86.5 kg 86.5 kg 86 kg    Intake/Output: I/O last 3 completed shifts: In: 960 [P.O.:960] Out: 2850 [Urine:2850]   Intake/Output this shift:  Total I/O In: 240 [P.O.:240] Out: 500 [Urine:500]  Physical Exam: General: NAD  Head: Normocephalic, atraumatic. Moist oral mucosal membranes  Eyes: Anicteric  Lungs:  Clear to auscultation, normal effort  Heart: Regular rate and rhythm  Abdomen:  Soft, nontender  Extremities: 2+ peripheral edema.  Neurologic: Awake, alert, conversant  Skin: Warm,dry, no rash  Access: None    Basic Metabolic Panel: Recent Labs  Lab 03/04/24 0619 03/05/24 0353 03/06/24 0358  NA 140 138 140  K 4.9 4.6 5.0  CL 108 109 108  CO2 18* 18* 20*  GLUCOSE 141* 113* 113*  BUN 91* 89* 96*  CREATININE 5.55* 5.38* 5.55*  CALCIUM  8.3* 8.2* 8.2*  MG 1.5* 1.7 1.7  PHOS 6.1* 6.4*  --     Liver Function Tests: Recent Labs  Lab 03/04/24 0619  AST 14*  ALT 10  ALKPHOS 58   BILITOT 0.5  PROT 6.4*  ALBUMIN  2.7*   Recent Labs  Lab 03/04/24 0619  LIPASE 47   No results for input(s): AMMONIA in the last 168 hours.  CBC: Recent Labs  Lab 03/04/24 0619 03/06/24 0358  WBC 7.0 6.2  NEUTROABS 4.1  --   HGB 7.7* 7.0*  HCT 24.0* 22.2*  MCV 94.1 92.9  PLT 177 167    Cardiac Enzymes: No results for input(s): CKTOTAL, CKMB, CKMBINDEX, TROPONINI in the last 168 hours.  BNP: Invalid input(s): POCBNP  CBG: Recent Labs  Lab 03/05/24 1212 03/05/24 1634 03/05/24 2050 03/06/24 0844 03/06/24 1206  GLUCAP 207* 117* 184* 199* 119*    Microbiology: Results for orders placed or performed during the hospital encounter of 11/22/23  Resp panel by RT-PCR (RSV, Flu A&B, Covid) Anterior Nasal Swab     Status: None   Collection Time: 11/22/23  8:26 PM   Specimen: Anterior Nasal Swab  Result Value Ref Range Status   SARS Coronavirus 2 by RT PCR NEGATIVE NEGATIVE Final    Comment: (NOTE) SARS-CoV-2 target nucleic acids are NOT DETECTED.  The SARS-CoV-2 RNA is generally detectable in upper respiratory specimens during the acute phase of infection. The lowest concentration of SARS-CoV-2 viral copies this assay can detect is 138 copies/mL. A negative result does not preclude SARS-Cov-2 infection and should not be used as the sole basis for treatment or other patient management  decisions. A negative result may occur with  improper specimen collection/handling, submission of specimen other than nasopharyngeal swab, presence of viral mutation(s) within the areas targeted by this assay, and inadequate number of viral copies(<138 copies/mL). A negative result must be combined with clinical observations, patient history, and epidemiological information. The expected result is Negative.  Fact Sheet for Patients:  BloggerCourse.com  Fact Sheet for Healthcare Providers:  SeriousBroker.it  This test is  no t yet approved or cleared by the [place]  FDA and  has been authorized for detection and/or diagnosis of SARS-CoV-2 by FDA under an Emergency Use Authorization (EUA). This EUA will remain  in effect (meaning this test can be used) for the duration of the COVID-19 declaration under Section 564(b)(1) of the Act, 21 U.S.C.section 360bbb-3(b)(1), unless the authorization is terminated  or revoked sooner.       Influenza A by PCR NEGATIVE NEGATIVE Final   Influenza B by PCR NEGATIVE NEGATIVE Final    Comment: (NOTE) The Xpert Xpress SARS-CoV-2/FLU/RSV plus assay is intended as an aid in the diagnosis of influenza from Nasopharyngeal swab specimens and should not be used as a sole basis for treatment. Nasal washings and aspirates are unacceptable for Xpert Xpress SARS-CoV-2/FLU/RSV testing.  Fact Sheet for Patients: BloggerCourse.com  Fact Sheet for Healthcare Providers: SeriousBroker.it  This test is not yet approved or cleared by the [place]  FDA and has been authorized for detection and/or diagnosis of SARS-CoV-2 by FDA under an Emergency Use Authorization (EUA). This EUA will remain in effect (meaning this test can be used) for the duration of the COVID-19 declaration under Section 564(b)(1) of the Act, 21 U.S.C. section 360bbb-3(b)(1), unless the authorization is terminated or revoked.     Resp Syncytial Virus by PCR NEGATIVE NEGATIVE Final    Comment: (NOTE) Fact Sheet for Patients: BloggerCourse.com  Fact Sheet for Healthcare Providers: SeriousBroker.it  This test is not yet approved or cleared by the [place]  FDA and has been authorized for detection and/or diagnosis of SARS-CoV-2 by FDA under an Emergency Use Authorization (EUA). This EUA will remain in effect (meaning this test can be used) for the duration of the COVID-19 declaration under Section  564(b)(1) of the Act, 21 U.S.C. section 360bbb-3(b)(1), unless the authorization is terminated or revoked.  Performed at [facility], [address] [place], [place] [address]     Coagulation Studies: No results for input(s): LABPROT, INR in the last 72 hours.  Urinalysis: No results for input(s): COLORURINE, LABSPEC, PHURINE, GLUCOSEU, HGBUR, BILIRUBINUR, KETONESUR, PROTEINUR, UROBILINOGEN, NITRITE, LEUKOCYTESUR in the last 72 hours.  Invalid input(s): APPERANCEUR    Imaging: No results found.    Medications:     apixaban   5 mg Oral BID   budesonide -glycopyrrolate -formoterol   2 puff Inhalation BID   carvedilol   12.5 mg Oral BID WC   furosemide   40 mg Intravenous BID   hydrALAZINE   10 mg Oral Q8H   insulin  aspart  0-5 Units Subcutaneous QHS   insulin  aspart  0-9 Units Subcutaneous TID WC   isosorbide  mononitrate  30 mg Oral Daily   lidocaine   1 patch Transdermal Q24H   pantoprazole   40 mg Oral Daily   rosuvastatin   10 mg Oral QHS   sodium bicarbonate   650 mg Oral TID   acetaminophen , ipratropium, LORazepam , methocarbamol , metoprolol  tartrate, ondansetron  (ZOFRAN ) IV  Assessment/ Plan:  [name] is a 68 y.o.  male  with past medical conditions including CHF, COPD, CAD, stroke, diabetes, CKDIV and  hypertension.  Patient has been admitted for CHF (congestive heart failure) (HCC) [I50.9] Tongue lesion [K14.8] Acute pain of right shoulder [M25.511]   Acute Kidney Injury on chronic kidney disease stage V with baseline creatinine 4.14 and GFR of 15 on 01/01/24.  Acute kidney injury secondary to cardiorenal syndrome.    Creatinine rose slightly today.  However patient made adequate urine output, 2450 mL.  Will continue current regimen and monitor renal function during diuretic therapy.  Discussed with patient the possibility of needing renal replacement therapy if numbers continue to worsen.  Patient agreeable if  needed.  Will continue to monitor closely.   Lab Results  Component Value Date   CREATININE 5.55 (H) 03/06/2024   CREATININE 5.38 (H) 03/05/2024   CREATININE 5.55 (H) 03/04/2024    Intake/Output Summary (Last 24 hours) at 03/06/2024 1431 Last data filed at 03/06/2024 1033 Gross per 24 hour  Intake 720 ml  Output 1975 ml  Net -1255 ml    2.  Acute metabolic acidosis.  Serum bicarb 18 on ED arrival.  Primary team is ordered oral sodium bicarbonate  650 mg 3 times daily.  Serum bicarb responding appropriately.  3.  Acute on chronic heart failure.  Most recent echo on 11/05/2023 shows EF 50 to 55% with grade 1 diastolic dysfunction and trivial MVR.  Chest x-ray shows pulmonary vascular congestion.  Patient is adequately diuresing with IV furosemide  40 mg twice daily.  4. Anemia of chronic kidney disease Lab Results  Component Value Date   HGB 7.0 (L) 03/06/2024    Hemoglobin continues to decrease.  Will defer to primary team need of blood transfusion.   LOS: 0 Alexzander Dolinger 9/24/20252:31 PM

## 2024-03-06 NOTE — Progress Notes (Signed)
 Heart Failure Navigator Progress Note Patient has been followed by Ellouise Class, FNP at [facility] but missed his last appointment there on 11/22/22.  Since then he has moved to [place] and has a PCP there so he had not been following up with Dr. Fernand cardiology either.  Follow-Up scheduled for patient with Ellouise again on 03/14/24 @ 10:00.  He requested that we call the facility to make sure if he will be coming to that appointment or not prior to the scheduled appointment.    Navigator will sign off at this time.  Charmaine Pines, RN, BSN [facility] Heart Failure Navigator Secure Chat Only

## 2024-03-06 NOTE — Plan of Care (Signed)

## 2024-03-06 NOTE — Progress Notes (Signed)
 Progress Note   Patient: Larry Callahan FMW:[id number] DOB: [birth year] DOA: 03/04/2024     0 DOS: the patient was seen and examined on 03/06/2024   Brief hospital course: Larry Callahan is a 68 y.o. male with medical history significant of chronic HFrEF and HFpEF with improved LVEF from 40% to 50% recently, a flutter status post ablation on Eliquis , CKD stage IV HTN, IDDM, COPD, nonobstructive CAD, CVA with residual right-sided weakness, sent from nursing home for evaluation of new onset of right shoulder and right upper arm pain and shortness of breath. Admitted for acute on chronic heart failure.    Assessment and Plan:  Acute on chronic combined HFrEF and HFpEF decompensation - Significant fluid overload on physical exam and x-ray - Reason for decompensation is probably multifactorial from worsening of anemia as well as probably frequent breakthrough of A-fib with RVR. -Troponin 22 -> 23, likely demand - Echo 05/25 EF 50 to 55%, no RWMA.  Grade I diastolic dysfunction - Continue IV Lasix  40 mg twice daily, and monitor kidney function closely - Daily weights, monitor I's/o   A-fib with RVR - s/p Digoxin  - Coreg  increased 12.5 mg bid - Continue Eliquis    Right shoulder pain/right upper arm pain - improved - Clinically suspect pinched nerve from underlying cervical spinal radiculopathy - XR C-spine: Moderate spondylosis of the cervical spine with minimal disc disease at the C3-C4 level - XR right shoulder: Mild degenerative changes of acromioclavicular joint - Outpatient follow up   AKI on CKD stage IV Metabolic acidosis - Volume overload related, clinically suspect cardiorenal syndrome - Cr 5.55 -> 5.38, Baseline Cr ~ 4.1 - Diuresis as above - Plan of care discussed with nephrologist - Bicarb tabs - No indication for dialysis, also patient refused   Acute on chronic anemia secondary to CKD Symptomatic anemia - Patient refused PRBC and all other blood  products - Check iron  study and reticulocyte count, will consider IV iron  versus EPO infusion if indicated.   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 - No new neurological deficits, continue Eliquis  and statin   DVT prophylaxis: Eliquis   Subjective:  .  Denies nausea vomiting abdominal pain or chest pain Renal function still elevated being closely followed by nephrologist  Physical Exam:  Constitutional: NAD, calm, comfortable  Neck: normal, supple, no masses, no thyromegaly Respiratory: clear to auscultation bilaterally, no wheezing, no crackles. Normal respiratory effort. No accessory muscle use.  Cardiovascular: RRR, no murmurs / rubs / gallops.  1+ extremity edema Abdomen: no tenderness, no masses palpated. No hepatosplenomegaly. Bowel sounds positive.  Musculoskeletal: Tenderness on right shoulder muscle and deltoid and decreased light touch sensation of right shoulder and right upper arm Skin: no rashes, lesions, ulcers. No induration Neurologic: AO x3, No gross focal deficits    Vitals:   03/06/24 0517 03/06/24 0840 03/06/24 1205 03/06/24 1522  BP:  (!) 154/75 (!) 126/99 120/68  Pulse:  78 74 71  Resp:      Temp:  97.8 F (36.6 C) 97.9 F (36.6 C) 98 F (36.7 C)  TempSrc:   Oral Oral  SpO2:  100% 100% 100%  Weight: 86 kg     Height:  Data Reviewed:    Latest Ref Rng & Units 03/06/2024    3:58 AM 03/04/2024    6:19 AM 12/15/2023    4:20 AM  CBC  WBC 4.0 - 10.5 K/uL 6.2  7.0  6.4   Hemoglobin 13.0 - 17.0 g/dL 7.0  7.7  8.8   Hematocrit 39.0 - 52.0 % 22.2  24.0  27.6   Platelets 150 - 400 K/uL 167  177  106        Latest Ref Rng & Units 03/06/2024    3:58 AM 03/05/2024    3:53 AM 03/04/2024    6:19 AM  BMP   Glucose 70 - 99 mg/dL 886  886  858   BUN 8 - 23 mg/dL 96  89  91   Creatinine 0.61 - 1.24 mg/dL 4.44  4.61  4.44   Sodium 135 - 145 mmol/L 140  138  140   Potassium 3.5 - 5.1 mmol/L 5.0  4.6  4.9   Chloride 98 - 111 mmol/L 108  109  108   CO2 22 - 32 mmol/L 20  18  18    Calcium  8.9 - 10.3 mg/dL 8.2  8.2  8.3      Author: Drue ONEIDA Potter, MD 03/06/2024 5:07 PM  For on call review [email or web address].

## 2024-03-07 ENCOUNTER — Encounter: Payer: Self-pay | Admitting: Vascular Surgery

## 2024-03-07 ENCOUNTER — Encounter
Admission: EM | Disposition: A | Discharge status: Skilled Nursing Facility | Payer: Self-pay | Source: Skilled Nursing Facility | Attending: Internal Medicine

## 2024-03-07 DIAGNOSIS — N179 Acute kidney failure, unspecified: Secondary | ICD-10-CM | POA: Diagnosis not present

## 2024-03-07 DIAGNOSIS — N186 End stage renal disease: Secondary | ICD-10-CM

## 2024-03-07 DIAGNOSIS — N189 Chronic kidney disease, unspecified: Secondary | ICD-10-CM

## 2024-03-07 HISTORY — PX: TEMPORARY DIALYSIS CATHETER: CATH118312

## 2024-03-07 LAB — CBC WITH DIFFERENTIAL/PLATELET
Abs Immature Granulocytes: 0.03 K/uL (ref 0.00–0.07)
Basophils Absolute: 0.1 K/uL (ref 0.0–0.1)
Basophils Relative: 1 %
Eosinophils Absolute: 0.3 K/uL (ref 0.0–0.5)
Eosinophils Relative: 4 %
HCT: 23.1 % — ABNORMAL LOW (ref 39.0–52.0)
Hemoglobin: 7.4 g/dL — ABNORMAL LOW (ref 13.0–17.0)
Immature Granulocytes: 0 %
Lymphocytes Relative: 35 %
Lymphs Abs: 2.3 K/uL (ref 0.7–4.0)
MCH: 30.1 pg (ref 26.0–34.0)
MCHC: 32 g/dL (ref 30.0–36.0)
MCV: 93.9 fL (ref 80.0–100.0)
Monocytes Absolute: 0.6 K/uL (ref 0.1–1.0)
Monocytes Relative: 9 %
Neutro Abs: 3.5 K/uL (ref 1.7–7.7)
Neutrophils Relative %: 51 %
Platelets: 165 K/uL (ref 150–400)
RBC: 2.46 MIL/uL — ABNORMAL LOW (ref 4.22–5.81)
RDW: 14.6 % (ref 11.5–15.5)
WBC: 6.8 K/uL (ref 4.0–10.5)
nRBC: 0 % (ref 0.0–0.2)

## 2024-03-07 LAB — BASIC METABOLIC PANEL WITH GFR
Anion gap: 12 (ref 5–15)
BUN: 94 mg/dL — ABNORMAL HIGH (ref 8–23)
CO2: 20 mmol/L — ABNORMAL LOW (ref 22–32)
Calcium: 8.4 mg/dL — ABNORMAL LOW (ref 8.9–10.3)
Chloride: 107 mmol/L (ref 98–111)
Creatinine, Ser: 5.98 mg/dL — ABNORMAL HIGH (ref 0.61–1.24)
GFR, Estimated: 9 mL/min — ABNORMAL LOW (ref >60)
Glucose, Bld: 118 mg/dL — ABNORMAL HIGH (ref 70–99)
Potassium: 4.9 mmol/L (ref 3.5–5.1)
Sodium: 139 mmol/L (ref 135–145)

## 2024-03-07 LAB — GLUCOSE, CAPILLARY
Glucose-Capillary: 110 mg/dL — ABNORMAL HIGH (ref 70–99)
Glucose-Capillary: 207 mg/dL — ABNORMAL HIGH (ref 70–99)
Glucose-Capillary: 227 mg/dL — ABNORMAL HIGH (ref 70–99)
Glucose-Capillary: 95 mg/dL (ref 70–99)
Glucose-Capillary: 156 mg/dL — ABNORMAL HIGH (ref 70–99)

## 2024-03-07 LAB — HEPATITIS B SURFACE ANTIGEN: Hepatitis B Surface Ag: NONREACTIVE

## 2024-03-07 SURGERY — TEMPORARY DIALYSIS CATHETER
Anesthesia: Moderate Sedation

## 2024-03-07 MED ORDER — SODIUM CHLORIDE 0.9 % IV SOLN: INTRAVENOUS | Status: DC

## 2024-03-07 MED ORDER — LIDOCAINE HCL (PF) 1 % IJ SOLN
INTRAMUSCULAR | Status: DC | PRN
  Administered 2024-03-07: 5 mL via INTRADERMAL

## 2024-03-07 MED ORDER — CHLORHEXIDINE GLUCONATE CLOTH 2 % EX PADS
6.0000 | MEDICATED_PAD | Freq: Every day | CUTANEOUS | Status: DC
  Administered 2024-03-08 – 2024-03-12 (×5): 6 via TOPICAL

## 2024-03-07 SURGICAL SUPPLY — 5 items
BIOPATCH RED 1 DISK 7.0 (GAUZE/BANDAGES/DRESSINGS) IMPLANT
CANNULA 5F STIFF (CANNULA) IMPLANT
COVER PROBE ULTRASOUND 5X96 (MISCELLANEOUS) IMPLANT
KIT DIALYSIS CATH TRI 30X13 (CATHETERS) IMPLANT
PACK ANGIOGRAPHY (CUSTOM PROCEDURE TRAY) ×1 IMPLANT

## 2024-03-07 NOTE — Progress Notes (Signed)
 Progress Note   Patient: Larry Callahan DOB: 12-Oct-1955 DOA: 03/04/2024     1 DOS: the patient was seen and examined on 03/07/2024   Brief hospital course: Larry Callahan is a 68 y.o. male with medical history significant of chronic HFrEF and HFpEF with improved LVEF from 40% to 50% recently, a flutter status post ablation on Eliquis , CKD stage IV HTN, IDDM, COPD, nonobstructive CAD, CVA with residual right-sided weakness, sent from nursing home for evaluation of new onset of right shoulder and right upper arm pain and shortness of breath. Admitted for acute on chronic heart failure.     Assessment and Plan:   Acute on chronic combined HFrEF and HFpEF decompensation - Significant fluid overload on physical exam and x-ray - Reason for decompensation is probably multifactorial from worsening of anemia as well as probably frequent breakthrough of A-fib with RVR. -Troponin 22 -> 23, likely demand - Echo 05/25 EF 50 to 55%, no RWMA.  Grade I diastolic dysfunction - Continue IV Lasix  40 mg twice daily, and monitor kidney function closely - Daily weights, monitor I's/o   A-fib with RVR - s/p Digoxin  - Coreg  increased 12.5 mg bid - Continue Eliquis    Right shoulder pain/right upper arm pain - improved - Clinically suspect pinched nerve from underlying cervical spinal radiculopathy - XR C-spine: Moderate spondylosis of the cervical spine with minimal disc disease at the C3-C4 level - XR right shoulder: Mild degenerative changes of acromioclavicular joint - Outpatient follow up   AKI on CKD stage IV-worsening Metabolic acidosis - Volume overload related, clinically suspect cardiorenal syndrome - Cr 5.55 -> 5.38, Baseline Cr ~ 4.1 - Diuresis as above - Plan of care discussed with nephrologist - Nephrologist initiated hemodialysis in this hospitalization   Acute on chronic anemia secondary to CKD Symptomatic anemia - Patient refused PRBC and all other blood  products - Check iron  study and reticulocyte count, will consider IV iron  versus EPO infusion if indicated.   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 - No new neurological deficits, continue Eliquis  and statin   DVT prophylaxis: Eliquis    Subjective:  Seen and examined at bedside this morning He has finally agreed to hemodialysis after discussion with nephrologist today given worsening renal function Denies chest pain nausea abdominal pain  Physical Exam:   Constitutional: NAD, calm, comfortable  Neck: normal, supple, no masses, no thyromegaly Respiratory: clear to auscultation bilaterally, no wheezing, no crackles. Normal respiratory effort. No accessory muscle use.  Cardiovascular: RRR, no murmurs / rubs / gallops.  1+ extremity edema Abdomen: no tenderness, no masses palpated. No hepatosplenomegaly. Bowel sounds positive.  Musculoskeletal: Tenderness on right shoulder muscle and deltoid and decreased light touch sensation of right shoulder and right upper arm Skin: no rashes, lesions, ulcers. No induration Neurologic: AO x3, No gross focal deficits     Data Reviewed:  Vitals:   03/07/24 0811 03/07/24 1145 03/07/24 1450 03/07/24 1550  BP: (!) 155/82 136/77 136/72 (!) 145/78  Pulse: 77 66 72 71  Resp:   16 18  Temp: 98.2 F (36.8 C) 98.2 F (36.8 C) (!) 97.5 F (36.4 C) (!) 96.8 F (36 C)  TempSrc:      SpO2: 100% 100%  98% 98%  Weight:      Height:          Latest Ref Rng & Units 03/07/2024    3:51 AM 03/06/2024    3:58 AM 03/05/2024    3:53 AM  BMP  Glucose 70 - 99 mg/dL 881  886  886   BUN 8 - 23 mg/dL 94  96  89   Creatinine 0.61 - 1.24 mg/dL 4.01  4.44  4.61   Sodium 135 - 145 mmol/L 139  140  138   Potassium 3.5 -  5.1 mmol/L 4.9  5.0  4.6   Chloride 98 - 111 mmol/L 107  108  109   CO2 22 - 32 mmol/L 20  20  18    Calcium  8.9 - 10.3 mg/dL 8.4  8.2  8.2        Latest Ref Rng & Units 03/07/2024    3:51 AM 03/06/2024    3:58 AM 03/04/2024    6:19 AM  CBC  WBC 4.0 - 10.5 K/uL 6.8  6.2  7.0   Hemoglobin 13.0 - 17.0 g/dL 7.4  7.0  7.7   Hematocrit 39.0 - 52.0 % 23.1  22.2  24.0   Platelets 150 - 400 K/uL 165  167  177     Author: Drue ONEIDA Potter, MD 03/07/2024 3:54 PM  For on call review [email or web address].

## 2024-03-07 NOTE — Progress Notes (Signed)
 [facility]  ROUNDING NOTE   Subjective:   Larry Callahan is a 68 y.o. male with past medical conditions including CHF, COPD, CAD, stroke, diabetes, CKDIV and hypertension, who was admitted for CHF (congestive heart failure) (HCC) [I50.9] Tongue lesion [K14.8] Acute pain of right shoulder [M25.511]  Patient is known to our practice and is followed by Dr. Marcelino.  Patient was last seen in our office on 9/15 for routine follow-up.    Update Patient seen sitting up in bed Completed breakfast tray at bedside Denies shortness of breath  Creatinine 5.98 UOP  Objective:  Vital signs in last 24 hours:  Temp:  [97.2 F (36.2 C)-98.7 F (37.1 C)] 98.2 F (36.8 C) (09/25 1145) Pulse Rate:  [66-77] 66 (09/25 1145) Resp:  [17-18] 18 (09/25 0412) BP: (120-157)/(56-82) 136/77 (09/25 1145) SpO2:  [99 %-100 %] 100 % (09/25 1145) Weight:  [85.3 kg] 85.3 kg (09/25 0500)  Weight change: -0.7 kg Filed Weights   03/05/24 0503 03/06/24 0517 03/07/24 0500  Weight: 86.5 kg 86 kg 85.3 kg    Intake/Output: I/O last 3 completed shifts: In: 900 [P.O.:900] Out: 3150 [Urine:3150]   Intake/Output this shift:  Total I/O In: 240 [P.O.:240] Out: 450 [Urine:450]  Physical Exam: General: NAD  Head: Normocephalic, atraumatic. Moist oral mucosal membranes  Eyes: Anicteric  Lungs:  Clear to auscultation, normal effort  Heart: Regular rate and rhythm  Abdomen:  Soft, nontender  Extremities: 2+ peripheral edema.  Neurologic: Awake, alert, conversant  Skin: Warm,dry, no rash  Access: None    Basic Metabolic Panel: Recent Labs  Lab 03/04/24 0619 03/05/24 0353 03/06/24 0358 03/07/24 0351  NA 140 138 140 139  K 4.9 4.6 5.0 4.9  CL 108 109 108 107  CO2 18* 18* 20* 20*  GLUCOSE 141* 113* 113* 118*  BUN 91* 89* 96* 94*  CREATININE 5.55* 5.38* 5.55* 5.98*  CALCIUM  8.3* 8.2* 8.2* 8.4*  MG 1.5* 1.7 1.7  --   PHOS 6.1* 6.4*  --   --     Liver Function  Tests: Recent Labs  Lab 03/04/24 0619  AST 14*  ALT 10  ALKPHOS 58  BILITOT 0.5  PROT 6.4*  ALBUMIN  2.7*   Recent Labs  Lab 03/04/24 0619  LIPASE 47   No results for input(s): AMMONIA in the last 168 hours.  CBC: Recent Labs  Lab 03/04/24 0619 03/06/24 0358 03/07/24 0351  WBC 7.0 6.2 6.8  NEUTROABS 4.1  --  3.5  HGB 7.7* 7.0* 7.4*  HCT 24.0* 22.2* 23.1*  MCV 94.1 92.9 93.9  PLT 177 167 165    Cardiac Enzymes: No results for input(s): CKTOTAL, CKMB, CKMBINDEX, TROPONINI in the last 168 hours.  BNP: Invalid input(s): POCBNP  CBG: Recent Labs  Lab 03/06/24 1813 03/06/24 2102 03/07/24 0808 03/07/24 0953 03/07/24 1140  GLUCAP 189* 124* 110* 207* 227*    Microbiology: Results for orders placed or performed during the hospital encounter of 11/22/23  Resp panel by RT-PCR (RSV, Flu A&B, Covid) Anterior Nasal Swab     Status: None   Collection Time: 11/22/23  8:26 PM   Specimen: Anterior Nasal Swab  Result Value Ref Range Status   SARS Coronavirus 2 by RT PCR NEGATIVE NEGATIVE Final    Comment: (NOTE) SARS-CoV-2 target nucleic acids are NOT DETECTED.  The SARS-CoV-2 RNA is generally detectable in upper respiratory specimens during the acute phase of infection. The lowest concentration of SARS-CoV-2 viral copies this assay can detect is  138 copies/mL. A negative result does not preclude SARS-Cov-2 infection and should not be used as the sole basis for treatment or other patient management decisions. A negative result may occur with  improper specimen collection/handling, submission of specimen other than nasopharyngeal swab, presence of viral mutation(s) within the areas targeted by this assay, and inadequate number of viral copies(<138 copies/mL). A negative result must be combined with clinical observations, patient history, and epidemiological information. The expected result is Negative.  Fact Sheet for Patients:   BloggerCourse.com  Fact Sheet for Healthcare Providers:  SeriousBroker.it  This test is no t yet approved or cleared by the [place]  FDA and  has been authorized for detection and/or diagnosis of SARS-CoV-2 by FDA under an Emergency Use Authorization (EUA). This EUA will remain  in effect (meaning this test can be used) for the duration of the COVID-19 declaration under Section 564(b)(1) of the Act, 21 U.S.C.section 360bbb-3(b)(1), unless the authorization is terminated  or revoked sooner.       Influenza A by PCR NEGATIVE NEGATIVE Final   Influenza B by PCR NEGATIVE NEGATIVE Final    Comment: (NOTE) The Xpert Xpress SARS-CoV-2/FLU/RSV plus assay is intended as an aid in the diagnosis of influenza from Nasopharyngeal swab specimens and should not be used as a sole basis for treatment. Nasal washings and aspirates are unacceptable for Xpert Xpress SARS-CoV-2/FLU/RSV testing.  Fact Sheet for Patients: BloggerCourse.com  Fact Sheet for Healthcare Providers: SeriousBroker.it  This test is not yet approved or cleared by the [place]  FDA and has been authorized for detection and/or diagnosis of SARS-CoV-2 by FDA under an Emergency Use Authorization (EUA). This EUA will remain in effect (meaning this test can be used) for the duration of the COVID-19 declaration under Section 564(b)(1) of the Act, 21 U.S.C. section 360bbb-3(b)(1), unless the authorization is terminated or revoked.     Resp Syncytial Virus by PCR NEGATIVE NEGATIVE Final    Comment: (NOTE) Fact Sheet for Patients: BloggerCourse.com  Fact Sheet for Healthcare Providers: SeriousBroker.it  This test is not yet approved or cleared by the [place]  FDA and has been authorized for detection and/or diagnosis of SARS-CoV-2 by FDA under an Emergency Use  Authorization (EUA). This EUA will remain in effect (meaning this test can be used) for the duration of the COVID-19 declaration under Section 564(b)(1) of the Act, 21 U.S.C. section 360bbb-3(b)(1), unless the authorization is terminated or revoked.  Performed at [facility], [address] [place], [place] [address]     Coagulation Studies: No results for input(s): LABPROT, INR in the last 72 hours.  Urinalysis: No results for input(s): COLORURINE, LABSPEC, PHURINE, GLUCOSEU, HGBUR, BILIRUBINUR, KETONESUR, PROTEINUR, UROBILINOGEN, NITRITE, LEUKOCYTESUR in the last 72 hours.  Invalid input(s): APPERANCEUR    Imaging: US  RENAL Result Date: 03/06/2024 CLINICAL DATA:  Acute kidney failure EXAM: RENAL / URINARY TRACT ULTRASOUND COMPLETE COMPARISON:  CT abdomen and pelvis 09/17/2022. FINDINGS: Right Kidney: Renal measurements: 16.3 x 9.3 x 8.5 cm = volume: 673 mL. There is no hydronephrosis. There is increased echogenicity throughout. There are numerous cystic areas throughout the right kidney. The largest is in the mid kidney measuring 7.8 x 7.0 x 8.9 cm similar to the prior study. There is a solid mass in the lower pole of the right kidney measuring 5.7 x 5.2 x 5.7 cm similar to the prior study. Left Kidney: Left kidney is surgically absent. There is a cystic area in the nephrectomy bed measuring 9.3 x 5.6 cm similar  to prior CT. Bladder: Appears normal for degree of bladder distention. Other: None. IMPRESSION: 1. No hydronephrosis. 2. Increased echogenicity of the right kidney compatible with medical renal disease. 3. Stable solid mass in the lower pole of the right kidney measuring 5.7 x 5.2 x 5.7 cm. 4. Numerous cystic areas throughout the right kidney. 5. Status post left nephrectomy. 6. Stable cystic area in the nephrectomy bed measuring 9.3 x 5.6 cm similar to prior CT. Electronically Signed   By: Greig Pique M.D.   On: 03/06/2024 21:55       Medications:     apixaban   5 mg Oral BID   budesonide -glycopyrrolate -formoterol   2 puff Inhalation BID   carvedilol   12.5 mg Oral BID WC   furosemide   40 mg Intravenous BID   hydrALAZINE   10 mg Oral Q8H   insulin  aspart  0-5 Units Subcutaneous QHS   insulin  aspart  0-9 Units Subcutaneous TID WC   isosorbide  mononitrate  30 mg Oral Daily   lidocaine   1 patch Transdermal Q24H   pantoprazole   40 mg Oral Daily   rosuvastatin   10 mg Oral QHS   sodium bicarbonate   650 mg Oral TID   acetaminophen , ipratropium, LORazepam , methocarbamol , metoprolol  tartrate, ondansetron  (ZOFRAN ) IV  Assessment/ Plan:  [name] is a 68 y.o.  male  with past medical conditions including CHF, COPD, CAD, stroke, diabetes, CKDIV and hypertension.  Patient has been admitted for CHF (congestive heart failure) (HCC) [I50.9] Tongue lesion [K14.8] Acute pain of right shoulder [M25.511]   Acute Kidney Injury on chronic kidney disease stage V with baseline creatinine 4.14 and GFR of 15 on 01/01/24.  Acute kidney injury secondary to cardiorenal syndrome.    Creatinine 5.98.  Despite having aequate urine otuput, we discussed the frequency of these admissions may increase due to failing kidneys and weak heart. We recommend starting hemodialysis to assist with management of fluid. Patient is agreeable to proceed. Vascular surgery consulted and will place HD temp cath. Will need permcath placed early next week. Will initiate dialysis later today or tomorrow, pending HD cath placement. Dialysis navigator aware of patient and will confirm status at [facility] where he can receive dialysis treatments inhouse.    Lab Results  Component Value Date   CREATININE 5.98 (H) 03/07/2024   CREATININE 5.55 (H) 03/06/2024   CREATININE 5.38 (H) 03/05/2024    Intake/Output Summary (Last 24 hours) at 03/07/2024 1226 Last data filed at 03/07/2024 1000 Gross per 24 hour  Intake 420 ml  Output 1625 ml  Net  -1205 ml    2.  Acute metabolic acidosis.  Serum bicarb 18 on ED arrival.  Primary team is ordered oral sodium bicarbonate  650 mg 3 times daily.  Serum bicarb Correcting  3.  Acute on chronic heart failure.  Most recent echo on 11/05/2023 shows EF 50 to 55% with grade 1 diastolic dysfunction and trivial MVR.  Chest x-ray shows pulmonary vascular congestion.  Continue IV Furosemide   4. Anemia of chronic kidney disease Lab Results  Component Value Date   HGB 7.4 (L) 03/07/2024    Hemoglobin continues to decrease.  Will consider ESA once dialysis initiated.    LOS: 1 Quaniyah Bugh 9/25/202512:26 PM

## 2024-03-07 NOTE — Plan of Care (Signed)

## 2024-03-07 NOTE — Consult Note (Signed)
 Hospital Consult    Reason for Consult:  Placement of Temporary Dialysis Catheter Requesting Physician:  Faith Harris NP MRN #:  [id number]  History of Present Illness: This is a 68 y.o. male with medical history significant of chronic HFrEF and HFpEF with improved LVEF from 40% to 50% recently, a flutter status post ablation on Eliquis , CKD stage IV HTN, IDDM, COPD, nonobstructive CAD, CVA with residual right-sided weakness, sent from nursing home for evaluation of new onset of right shoulder and right upper arm pain and shortness of breath.   Upon workup patient was found to be in CHF and volume overloaded.  His baseline creatinine prior to today was 4.1 and is now elevated to 5.55.  Patient was originally refusing any type of dialysis but now has agreed to a temporary dialysis catheter placement to help resolve some of his fluid overload.  Vascular surgery was consulted for temporary dialysis catheter placement.   Past Medical History:  Diagnosis Date   Allergy    Atrial flutter (HCC)    a. Dx 12/2020--CHA2DS2VASc = 5-->eliquis .   Chronic combined systolic (congestive) and diastolic (congestive) heart failure (HCC)    a. 04/2019 Echo: EF 45-50%; b. 02/2019 Echo: EF 55-60%; c. 09/2020 Echo: EF 35-40%, glob HK. Nl RV size/fxn. Mild MR; d. 07/2021 Echo: EF 40-45%, mild LVH, low-nl RV fxn, no MR.   CKD (chronic kidney disease), stage III (HCC)    COPD (chronic obstructive pulmonary disease) (HCC)    Coronary artery disease    a. 2011 Cath: nonobs dzs; b. 09/2020 MV: EF 38%. No signif ischemia. ? inf MI vs diaph attenuation. GI uptake artifact.   Diabetes mellitus without complication (HCC)    GERD (gastroesophageal reflux disease)    History of CVA (cerebrovascular accident) 09/25/2020   Hyperlipidemia    Hypertension    Morbid (severe) obesity due to excess calories (HCC) 06/09/2017   Morbid obesity (HCC)    NICM (nonischemic cardiomyopathy) (HCC)    a. 04/2019 Echo: EF 45-50%; b.  02/2019 Echo: EF 55-60%; c. 09/2020 Echo: EF 35-40%, glob HK; d. 09/2020 MV: No ischemia. ? inf infarct vs attenuation; d. 07/2021 Echo: EF 40-45%.   OSA (obstructive sleep apnea)    Pneumonia due to COVID-19 virus 01/30/2020   Stage 3b chronic kidney disease (HCC)    Stroke (HCC) 2024   Syncope and collapse 11/16/2020    Past Surgical History:  Procedure Laterality Date   CARDIAC CATHETERIZATION     CHOLECYSTECTOMY     COLONOSCOPY WITH PROPOFOL  N/A 04/12/2019   Procedure: COLONOSCOPY WITH PROPOFOL ;  Surgeon: Unk Corinn Skiff, MD;  Location: [name] ENDOSCOPY;  Service: Gastroenterology;  Laterality: N/A;   ESOPHAGOGASTRODUODENOSCOPY N/A 04/12/2019   Procedure: ESOPHAGOGASTRODUODENOSCOPY (EGD);  Surgeon: Unk Corinn Skiff, MD;  Location: [facility] ENDOSCOPY;  Service: Gastroenterology;  Laterality: N/A;   ESOPHAGOGASTRODUODENOSCOPY (EGD) WITH PROPOFOL  N/A 11/21/2019   Procedure: ESOPHAGOGASTRODUODENOSCOPY (EGD) WITH PROPOFOL ;  Surgeon: Unk Corinn Skiff, MD;  Location: [facility] ENDOSCOPY;  Service: Gastroenterology;  Laterality: N/A;   ESOPHAGOGASTRODUODENOSCOPY (EGD) WITH PROPOFOL  N/A 01/11/2022   Procedure: ESOPHAGOGASTRODUODENOSCOPY (EGD) WITH PROPOFOL ;  Surgeon: Jinny Carmine, MD;  Location: [name] ENDOSCOPY;  Service: Endoscopy;  Laterality: N/A;   ESOPHAGOGASTRODUODENOSCOPY (EGD) WITH PROPOFOL  N/A 08/02/2022   Procedure: ESOPHAGOGASTRODUODENOSCOPY (EGD) WITH PROPOFOL ;  Surgeon: Jinny Carmine, MD;  Location: [name] ENDOSCOPY;  Service: Endoscopy;  Laterality: N/A;   FLEXIBLE BRONCHOSCOPY Bilateral 05/17/2019   Procedure: FLEXIBLE BRONCHOSCOPY;  Surgeon: Fernand Elfreda LABOR, MD;  Location: [name] ORS;  Service: Pulmonary;  Laterality:  Bilateral;   left arm surgery     nephrectomy Left    PARATHYROIDECTOMY     RIGHT HEART CATH N/A 03/29/2019   Procedure: RIGHT HEART CATH;  Surgeon: Perla Evalene PARAS, MD;  Location: [name] INVASIVE CV LAB;  Service: Cardiovascular;  Laterality: N/A;    Allergies  Allergen  Reactions   Penicillins Rash, Dermatitis, Hives and Other (See Comments)    Other reaction(s): HIVES   Codeine  Nausea And Vomiting    Prior to Admission medications   Medication Sig Start Date End Date Taking? Authorizing Provider  acetaminophen  (TYLENOL ) 325 MG tablet Take 2 tablets (650 mg total) by mouth every 6 (six) hours as needed for mild pain or moderate pain (or Fever >/= 101). 08/03/22  Yes Von Bellis, MD  albuterol  (VENTOLIN  HFA) 108 (90 Base) MCG/ACT inhaler Inhale 1-2 puffs into the lungs every 4 (four) hours as needed for wheezing or shortness of breath. 10/12/23  Yes Alexander, Natalie, DO  amLODipine  (NORVASC ) 5 MG tablet Take 10 mg by mouth daily.   Yes [provider]  apixaban  (ELIQUIS ) 5 MG TABS tablet Take 1 tablet (5 mg total) by mouth 2 (two) times daily. 10/05/23  Yes Abernathy, Mardy, NP  carvedilol  (COREG ) 3.125 MG tablet Take 1 tablet (3.125 mg total) by mouth 2 (two) times daily with a meal. Patient taking differently: Take 9.375 mg by mouth 2 (two) times daily with a meal. 11/29/23  Yes Wieting, Richard, MD  cyanocobalamin  1000 MCG tablet Take 1 tablet (1,000 mcg total) by mouth daily. 10/05/23  Yes Abernathy, Mardy, NP  empagliflozin  (JARDIANCE ) 10 MG TABS tablet Take 1 tablet (10 mg total) by mouth daily. 12/19/23  Yes Wieting, Richard, MD  ferrous sulfate  325 (65 FE) MG tablet Take 1 tablet (325 mg total) by mouth every other day. 12/19/23  Yes Wieting, Richard, MD  furosemide  (LASIX ) 40 MG tablet Take 1 tablet (40 mg total) by mouth daily. 10/05/23  Yes Abernathy, Mardy, NP  irbesartan  (AVAPRO ) 75 MG tablet Take 1 tablet (75 mg total) by mouth daily. Patient taking differently: Take 300 mg by mouth daily. 12/19/23  Yes Wieting, Richard, MD  LORazepam  (ATIVAN ) 0.5 MG tablet Take 0.5 mg by mouth every 8 (eight) hours.   Yes [provider]  methocarbamol  (ROBAXIN ) 750 MG tablet Take 1 tablet (750 mg total) by mouth every 6 (six) hours as needed for  muscle spasms. 10/05/23  Yes Liana Mardy, NP  metolazone  (ZAROXOLYN ) 2.5 MG tablet Take once weekly on Tuesdays 11/13/23  Yes Dezii, Alexandra, DO  pantoprazole  (PROTONIX ) 40 MG tablet Take 1 tablet (40 mg total) by mouth daily. 11/13/23 03/04/24 Yes Dezii, Alexandra, DO  sodium bicarbonate  650 MG tablet Take 1 tablet (650 mg total) by mouth 3 (three) times daily. 11/29/23  Yes Josette Ade, MD  ACCU-CHEK GUIDE test strip Use to check blood sugars 4 times a day E11.65 03/08/23   Liana Mardy, NP  Accu-Chek Softclix Lancets lancets Use to check blood sugars 4 times a day   E11.65 08/20/21   Liana Mardy, NP  Blood Glucose Monitoring Suppl (TRUE METRIX AIR GLUCOSE METER) DEVI 1 Device by Does not apply route 2 (two) times daily. 04/13/18   Boscia, Heather E, NP  budeson-glycopyrrolate -formoterol  (BREZTRI  AEROSPHERE) 160-9-4.8 MCG/ACT AERO inhaler Inhale 2 puffs into the lungs 2 (two) times daily. Patient not taking: Reported on 03/04/2024 10/05/23   Liana Mardy, NP  insulin  aspart (NOVOLOG ) 100 UNIT/ML injection Inject 2 Units into the skin 3 (three)  times daily with meals. If eating and Blood Glucose (BG) 80 or higher inject 2 units for meal coverage and add correction dose per scale. If not eating, correction dose only. BG <150= 0 unit; BG 150-200= 1 unit; BG 201-250= 2 unit; BG 251-300= 3 unit; BG 301-350= 4 unit; BG 351-400= 5 unit; BG >400= 6 unit and Call Primary Care. Patient not taking: Reported on 03/04/2024 11/29/23   Josette Ade, MD  rosuvastatin  (CRESTOR ) 10 MG tablet Take 1 tablet (10 mg total) by mouth at bedtime. 09/18/23   Liana Fish, NP    Social History   Socioeconomic History   Marital status: Single    Spouse name: Not on file   Number of children: Not on file   Years of education: Not on file   Highest education level: Not on file  Occupational History   Not on file  Tobacco Use   Smoking status: Every Day    Current packs/day: 0.00    Types:  Cigarettes    Last attempt to quit: 03/13/2020    Years since quitting: 3.9   Smokeless tobacco: Former    Types: Associate Professor status: Never Used  Substance and Sexual Activity   Alcohol use: No   Drug use: No   Sexual activity: Not Currently  Other Topics Concern   Not on file  Social History Narrative   Not on file   Social Drivers of Health   Financial Resource Strain: Low Risk  (11/16/2023)   Overall Financial Resource Strain (CARDIA)    Difficulty of Paying Living Expenses: Not very hard  Food Insecurity: No Food Insecurity (03/04/2024)   Hunger Vital Sign    Worried About Running Out of Food in the Last Year: Never true    Ran Out of Food in the Last Year: Never true  Transportation Needs: No Transportation Needs (03/04/2024)   PRAPARE - Administrator, Civil Service (Medical): No    Lack of Transportation (Non-Medical): No  Physical Activity: Not on file  Stress: Not on file  Social Connections: Unknown (11/23/2023)   Social Connection and Isolation Panel    Frequency of Communication with Friends and Family: Once a week    Frequency of Social Gatherings with Friends and Family: Once a week    Attends Religious Services: 1 to 4 times per year    Active Member of [employer] or Organizations: No    Attends Banker Meetings: Patient declined    Marital Status: Patient declined  Catering manager Violence: Not At Risk (03/04/2024)   Humiliation, Afraid, Rape, and Kick questionnaire    Fear of Current or Ex-Partner: No    Emotionally Abused: No    Physically Abused: No    Sexually Abused: No     Family History  Problem Relation Age of Onset   Diabetes Mother        2003   Lung cancer Father    Diabetes Sister    Hypertension Sister    Heart disease Sister    Cancer Sister    Bone cancer Brother    Prostate cancer Neg Hx    Bladder Cancer Neg Hx    Kidney cancer Neg Hx     ROS: Otherwise negative unless mentioned in  HPI  Physical Examination  Vitals:   03/07/24 0412 03/07/24 0811  BP: (!) 157/68 (!) 155/82  Pulse: 73 77  Resp: 18   Temp: 98.4 F (36.9 C) 98.2 F (  36.8 C)  SpO2: 100% 100%   Body mass index is 26.23 kg/m.  General:  WDWN in NAD Gait: Not observed HENT: WNL, normocephalic Pulmonary: normal non-labored breathing, without Rales, rhonchi,  wheezing Cardiac: regular, without  Murmurs, rubs or gallops; without carotid bruits Abdomen: Positive bowel sounds throughout, soft, NT/ND, no masses Skin: without rashes Vascular Exam/Pulses: Palpable pulses throughout.  +1 edema to upper and lower extremities.  Warm to touch. Extremities: without ischemic changes, without Gangrene , without cellulitis; without open wounds;  Musculoskeletal: no muscle wasting or atrophy  Neurologic: A&O X 3;  No focal weakness or paresthesias are detected; speech is fluent/normal Psychiatric:  The pt has Normal affect. Lymph:  Unremarkable  CBC    Component Value Date/Time   WBC 6.8 03/07/2024 0351   RBC 2.46 (L) 03/07/2024 0351   HGB 7.4 (L) 03/07/2024 0351   HGB 11.0 (L) 02/27/2018 0844   HCT 23.1 (L) 03/07/2024 0351   HCT 33.4 (L) 02/27/2018 0844   PLT 165 03/07/2024 0351   PLT 248 02/27/2018 0844   MCV 93.9 03/07/2024 0351   MCV 79 02/27/2018 0844   MCV 90 03/06/2013 0621   MCH 30.1 03/07/2024 0351   MCHC 32.0 03/07/2024 0351   RDW 14.6 03/07/2024 0351   RDW 15.6 (H) 02/27/2018 0844   RDW 13.9 03/06/2013 0621   LYMPHSABS 2.3 03/07/2024 0351   LYMPHSABS 2.2 02/27/2018 0844   LYMPHSABS 1.8 12/08/2012 0539   MONOABS 0.6 03/07/2024 0351   MONOABS 1.0 12/08/2012 0539   EOSABS 0.3 03/07/2024 0351   EOSABS 0.3 02/27/2018 0844   EOSABS 0.2 12/08/2012 0539   BASOSABS 0.1 03/07/2024 0351   BASOSABS 0.1 02/27/2018 0844   BASOSABS 0.1 12/08/2012 0539    BMET    Component Value Date/Time   NA 139 03/07/2024 0351   NA 136 07/15/2021 1151   NA 138 03/06/2013 0621   K 4.9 03/07/2024 0351    K 3.9 03/06/2013 0621   CL 107 03/07/2024 0351   CL 107 03/06/2013 0621   CO2 20 (L) 03/07/2024 0351   CO2 23 03/06/2013 0621   GLUCOSE 118 (H) 03/07/2024 0351   GLUCOSE 157 (H) 03/06/2013 0621   BUN 94 (H) 03/07/2024 0351   BUN 41 (H) 07/15/2021 1151   BUN 19 (H) 03/06/2013 0621   CREATININE 5.98 (H) 03/07/2024 0351   CREATININE 1.01 03/06/2013 0621   CALCIUM  8.4 (L) 03/07/2024 0351   CALCIUM  9.3 03/06/2013 0621   GFRNONAA 9 (L) 03/07/2024 0351   GFRNONAA >60 03/06/2013 0621   GFRAA 51 (L) 02/18/2020 1730   GFRAA >60 03/06/2013 0621    COAGS: Lab Results  Component Value Date   INR 1.8 (H) 11/15/2023   INR 1.4 (H) 09/12/2022   INR 1.7 (H) 07/31/2022     Non-Invasive Vascular Imaging:   EXAM: RENAL / URINARY TRACT ULTRASOUND COMPLETE   COMPARISON:  CT abdomen and pelvis 09/17/2022.   FINDINGS: Right Kidney:   Renal measurements: 16.3 x 9.3 x 8.5 cm = volume: 673 mL. There is no hydronephrosis. There is increased echogenicity throughout. There are numerous cystic areas throughout the right kidney. The largest is in the mid kidney measuring 7.8 x 7.0 x 8.9 cm similar to the prior study. There is a solid mass in the lower pole of the right kidney measuring 5.7 x 5.2 x 5.7 cm similar to the prior study.   Left Kidney:   Left kidney is surgically absent. There is a cystic area in  the nephrectomy bed measuring 9.3 x 5.6 cm similar to prior CT.   Bladder:   Appears normal for degree of bladder distention.   Other:   None.   IMPRESSION: 1. No hydronephrosis. 2. Increased echogenicity of the right kidney compatible with medical renal disease. 3. Stable solid mass in the lower pole of the right kidney measuring 5.7 x 5.2 x 5.7 cm. 4. Numerous cystic areas throughout the right kidney. 5. Status post left nephrectomy. 6. Stable cystic area in the nephrectomy bed measuring 9.3 x 5.6 cm similar to prior CT.  Statin:  Yes.   Beta Blocker:  Yes.   Aspirin :   No. ACEI:  No. ARB:  Yes.   CCB use:  Yes Other antiplatelets/anticoagulants:  Yes.   Eliquis  5 mg BID   ASSESSMENT/PLAN: This is a 68 y.o. male with a history of type 2 diabetes, chronic kidney disease stage IV, hypertension and anemia of chronic disease who presents to [facility] emergency department with right shoulder pain.  Upon workup he was noted to have an elevated BUN and creatinine from his baseline thus requiring hemodialysis.  He was seen by nephrology which recommended hemodialysis.  He initially refused but now has agreed to do temporary dialysis with a temporary dialysis catheter placement.  Vascular surgery plans on taking the patient to the vascular lab for temporary dialysis catheter access placement to start hemodialysis.  I discussed in detail with the patient the procedure, benefits, risk, complications.  Patient verbalized understanding wishes to proceed.   -I discussed the case with Dr. Selinda Gu MD and he agrees with the plan.   [name] [facility] 03/07/2024 8:32 AM

## 2024-03-08 ENCOUNTER — Encounter: Payer: Self-pay | Admitting: Vascular Surgery

## 2024-03-08 DIAGNOSIS — N179 Acute kidney failure, unspecified: Secondary | ICD-10-CM | POA: Diagnosis not present

## 2024-03-08 LAB — BASIC METABOLIC PANEL WITH GFR
Anion gap: 13 (ref 5–15)
BUN: 105 mg/dL — ABNORMAL HIGH (ref 8–23)
CO2: 20 mmol/L — ABNORMAL LOW (ref 22–32)
Calcium: 8.2 mg/dL — ABNORMAL LOW (ref 8.9–10.3)
Chloride: 106 mmol/L (ref 98–111)
Creatinine, Ser: 5.89 mg/dL — ABNORMAL HIGH (ref 0.61–1.24)
GFR, Estimated: 10 mL/min — ABNORMAL LOW (ref >60)
Glucose, Bld: 107 mg/dL — ABNORMAL HIGH (ref 70–99)
Potassium: 4.9 mmol/L (ref 3.5–5.1)
Sodium: 139 mmol/L (ref 135–145)

## 2024-03-08 LAB — CBC WITH DIFFERENTIAL/PLATELET
Abs Immature Granulocytes: 0.03 K/uL (ref 0.00–0.07)
Basophils Absolute: 0.1 K/uL (ref 0.0–0.1)
Basophils Relative: 1 %
Eosinophils Absolute: 0.2 K/uL (ref 0.0–0.5)
Eosinophils Relative: 3 %
HCT: 23.7 % — ABNORMAL LOW (ref 39.0–52.0)
Hemoglobin: 7.5 g/dL — ABNORMAL LOW (ref 13.0–17.0)
Immature Granulocytes: 0 %
Lymphocytes Relative: 33 %
Lymphs Abs: 2.3 K/uL (ref 0.7–4.0)
MCH: 29.6 pg (ref 26.0–34.0)
MCHC: 31.6 g/dL (ref 30.0–36.0)
MCV: 93.7 fL (ref 80.0–100.0)
Monocytes Absolute: 0.5 K/uL (ref 0.1–1.0)
Monocytes Relative: 8 %
Neutro Abs: 3.9 K/uL (ref 1.7–7.7)
Neutrophils Relative %: 55 %
Platelets: 178 K/uL (ref 150–400)
RBC: 2.53 MIL/uL — ABNORMAL LOW (ref 4.22–5.81)
RDW: 14.4 % (ref 11.5–15.5)
WBC: 7.1 K/uL (ref 4.0–10.5)
nRBC: 0 % (ref 0.0–0.2)

## 2024-03-08 LAB — GLUCOSE, CAPILLARY
Glucose-Capillary: 93 mg/dL (ref 70–99)
Glucose-Capillary: 117 mg/dL — ABNORMAL HIGH (ref 70–99)
Glucose-Capillary: 176 mg/dL — ABNORMAL HIGH (ref 70–99)
Glucose-Capillary: 137 mg/dL — ABNORMAL HIGH (ref 70–99)

## 2024-03-08 LAB — HEPATITIS B SURFACE ANTIBODY, QUANTITATIVE: Hep B S AB Quant (Post): <3.5 m[IU]/mL — ABNORMAL LOW

## 2024-03-08 LAB — HEPATITIS B CORE ANTIBODY, TOTAL: HEP B CORE AB: NEGATIVE

## 2024-03-08 MED ORDER — ALTEPLASE 2 MG IJ SOLR: 2.0000 mg | Freq: Once | INTRAMUSCULAR | Status: DC | PRN

## 2024-03-08 MED ORDER — EPOETIN ALFA-EPBX 10000 UNIT/ML IJ SOLN
10000.0000 [IU] | Freq: Once | INTRAMUSCULAR | Status: AC
  Administered 2024-03-09: 10000 [IU] via INTRAVENOUS
  Filled 2024-03-08: qty 2

## 2024-03-08 MED ORDER — HEPARIN SODIUM (PORCINE) 1000 UNIT/ML IJ SOLN
INTRAMUSCULAR | Status: AC
  Filled 2024-03-08: qty 4

## 2024-03-08 MED ORDER — HEPARIN SODIUM (PORCINE) 1000 UNIT/ML DIALYSIS
1000.0000 [IU] | INTRAMUSCULAR | Status: DC | PRN
  Administered 2024-03-08 (×4): 1000 [IU]
  Filled 2024-03-08 (×4): qty 1

## 2024-03-08 NOTE — Plan of Care (Signed)

## 2024-03-08 NOTE — Care Management Important Message (Signed)
 Important Message  Patient Details  Name: Larry Callahan MRN: [id number] Date of Birth: 22-Sep-1955   Important Message Given:  Yes - Medicare IM     Larry Callahan 03/08/2024, 4:28 PM

## 2024-03-08 NOTE — Progress Notes (Signed)
 Hemodialysis Note:   Received patient in bed to unit. Alert and oriented. Informed consent signed and in chart.   2 hour dialysis treatment time, First treatment   Access used: Left Femoral Vascath, triple lumen Access issues: None   Tolerated treatment well, no adverse effects noted.CVC dressing changed.       Total UF removed: 0 Medications given:  Heparin  post lumen CVC dwells          03/08/24 1145  Vitals  Temp 98.3 F (36.8 C)  Temp Source Oral  BP (!) 140/63  MAP (mmHg) 87  BP Location Left Arm  BP Method Automatic  Patient Position (if appropriate) Lying  Pulse Rate 70  Pulse Rate Source Monitor  ECG Heart Rate 69  Resp 17  Oxygen  Therapy  SpO2 100 %  O2 Device Room Air  Patient Activity (if Appropriate) In bed  Pulse Oximetry Type Continuous  Oximetry Probe Site Changed No  During Treatment Monitoring  Blood Flow Rate (mL/min) 0 mL/min  Arterial Pressure (mmHg) -40 mmHg  Venous Pressure (mmHg) 35.75 mmHg  TMP (mmHg) 12.93 mmHg  Ultrafiltration Rate (mL/min) 301 mL/min  Dialysate Flow Rate (mL/min) 299 ml/min  Duration of HD Treatment -hour(s) 2 hour(s)  Cumulative Fluid Removed (mL) per Treatment  0.05  Post Treatment  Dialyzer Clearance Lightly streaked  Hemodialysis Intake (mL) 300 mL  Liters Processed 24  Fluid Removed (mL) 0 mL  Tolerated HD Treatment Yes  Post-Hemodialysis Comments Tolerated treatment well.  Hemodialysis Catheter Left Femoral vein Triple lumen Temporary (Non-Tunneled)  Placement Date/Time: 03/07/24 1508   Serial / Lot #: [id number]  Expiration Date: 04/12/25  Time Out: Correct patient;Correct site;Correct procedure  Maximum sterile barrier precautions: Hand hygiene;Cap;Mask;Sterile gown;Sterile gloves;Large sterile sh...  Site Condition No complications  Blue Lumen Status Flushed;Heparin  locked;Antimicrobial dead end cap  Red Lumen Status Flushed;Heparin  locked;Antimicrobial dead end cap  Catheter fill solution Heparin  1000  units/ml  Catheter fill volume (Arterial) 1.8 cc  Catheter fill volume (Venous) 1.8  Dressing Type Transparent  Dressing Status Antimicrobial disc/dressing in place;Clean, Dry, Intact  Drainage Description None  Dressing Change Due 03/15/24  Post treatment catheter status Capped and Clamped

## 2024-03-08 NOTE — TOC Progression Note (Addendum)
 Transition of Care [facility]) - Progression Note    Patient Details  Name: Larry Callahan MRN: [id number] Date of Birth: 1955/12/15  Transition of Care [facility]) CM/SW Contact  Lauraine JAYSON Carpen, LCSW Phone Number: 03/08/2024, 3:50 PM  Clinical Narrative:  Left voicemail for [facility] admissions coordinator to let him know that patient will be new HD.   4:47 pm: Received call back from admissions coordinator. Provided update.  Expected Discharge Plan: Skilled Nursing Facility Barriers to Discharge: Continued Medical Work up               Expected Discharge Plan and Services     Post Acute Care Choice: Resumption of Svcs/PTA Provider Living arrangements for the past 2 months: Skilled Nursing Facility                                       Social Drivers of Health (SDOH) Interventions SDOH Screenings   Food Insecurity: No Food Insecurity (03/04/2024)  Housing: Low Risk  (03/04/2024)  Transportation Needs: No Transportation Needs (03/04/2024)  Utilities: Not At Risk (03/04/2024)  Alcohol Screen: Low Risk  (03/29/2022)  Depression (PHQ2-9): Low Risk  (09/13/2022)  Financial Resource Strain: Low Risk  (11/16/2023)  Social Connections: Unknown (11/23/2023)  Tobacco Use: High Risk (03/05/2024)    Readmission Risk Interventions    12/01/2021   12:25 PM 08/07/2021    2:36 PM 06/28/2021    2:26 PM  Readmission Risk Prevention Plan  Transportation Screening Complete Complete Complete  Medication Review Oceanographer) Complete Complete Complete  PCP or Specialist appointment within 3-5 days of discharge Complete Complete Complete  HRI or Home Care Consult Complete Complete Complete  SW Recovery Care/Counseling Consult Complete Complete Complete  Palliative Care Screening Complete Complete Not Applicable  Skilled Nursing Facility Complete Complete Not Applicable

## 2024-03-08 NOTE — Op Note (Signed)
  OPERATIVE NOTE   PROCEDURE: Insertion of temporary dialysis catheter catheter left femoral approach.  PRE-OPERATIVE DIAGNOSIS: Acute on chronic renal insufficiency  POST-OPERATIVE DIAGNOSIS: Same  SURGEON: Cordella KANDICE Shawl M.D.  ANESTHESIA: 1% lidocaine  local infiltration  ESTIMATED BLOOD LOSS: Minimal cc  INDICATIONS:   Larry Callahan is a 68 y.o. male who presents with acute on chronic renal insufficiency.  He now needs hemodialysis urgently and temporary catheter has been recommended.  Risks and benefits were reviewed all questions answered patient has agreed to proceed.  DESCRIPTION: After obtaining full informed written consent, the patient was positioned supine. The left groin was prepped and draped in a sterile fashion. Ultrasound was placed in a sterile sleeve. Ultrasound was utilized to identify the left common femoral vein which is noted to be echolucent and compressible indicating patency. Images recorded for the permanent record. Under real-time visualization a micropuncture needle is inserted into the vein under direct ultrasound visualization.  The microsheath is then advanced and the guidewire is advanced without difficulty. Small counterincision was made at the wire insertion site. Dilator is passed over the wire and the temporary dialysis catheter catheter is fed over the wire without difficulty.  All lumens aspirate and flush easily and are packed with heparin  saline. Catheter secured to the skin of the left thigh with 2-0 silk. A sterile dressing is applied with Biopatch.  COMPLICATIONS: None  CONDITION: Unchanged  Cordella Shawl Office:  [phone] 03/08/2024, 6:16 PM

## 2024-03-08 NOTE — Plan of Care (Signed)

## 2024-03-08 NOTE — Progress Notes (Signed)
 Pt is at [facility] term nursing home and will be submitting a referral for HD for [facility]. Navigator following to assist with any HD needs.   Suzen Satchel Dialysis Navigator [phone]

## 2024-03-08 NOTE — Progress Notes (Signed)
 [facility]  ROUNDING NOTE   Subjective:   Larry Callahan is a 68 y.o. male with past medical conditions including CHF, COPD, CAD, stroke, diabetes, CKDIV and hypertension, who was admitted for CHF (congestive heart failure) (HCC) [I50.9] Tongue lesion [K14.8] Acute pain of right shoulder [M25.511]  Patient is known to our practice and is followed by Dr. Marcelino.  Patient was last seen in our office on 9/15 for routine follow-up.    Update Patient seen and evaluated during dialysis   HEMODIALYSIS FLOWSHEET:  Blood Flow Rate (mL/min): 199 mL/min Arterial Pressure (mmHg): -103.02 mmHg Venous Pressure (mmHg): 72.93 mmHg TMP (mmHg): 12.93 mmHg Ultrafiltration Rate (mL/min): 300 mL/min Dialysate Flow Rate (mL/min): 200 ml/min  Tolerating first treatment well  Objective:  Vital signs in last 24 hours:  Temp:  [96.8 F (36 C)-98.5 F (36.9 C)] 98.3 F (36.8 C) (09/26 0921) Pulse Rate:  [66-78] 71 (09/26 1100) Resp:  [11-19] 17 (09/26 1100) BP: (119-150)/(48-92) 144/75 (09/26 1100) SpO2:  [95 %-100 %] 100 % (09/26 1100) Weight:  [86.4 kg-87.5 kg] 86.4 kg (09/26 0921)  Weight change: 2.2 kg Filed Weights   03/07/24 0500 03/08/24 0500 03/08/24 0921  Weight: 85.3 kg 87.5 kg 86.4 kg    Intake/Output: I/O last 3 completed shifts: In: 1380 [P.O.:1380] Out: 1976 [Urine:1976]   Intake/Output this shift:  Total I/O In: -  Out: 400 [Urine:400]  Physical Exam: General: NAD  Head: Normocephalic, atraumatic. Moist oral mucosal membranes  Eyes: Anicteric  Lungs:  Clear to auscultation, normal effort  Heart: Regular rate and rhythm  Abdomen:  Soft, nontender  Extremities: 2+ peripheral edema.  Neurologic: Awake, alert, conversant  Skin: Warm,dry, no rash  Access: None    Basic Metabolic Panel: Recent Labs  Lab 03/04/24 0619 03/05/24 0353 03/06/24 0358 03/07/24 0351 03/08/24 0507  NA 140 138 140 139 139  K 4.9 4.6 5.0 4.9 4.9  CL 108 109 108  107 106  CO2 18* 18* 20* 20* 20*  GLUCOSE 141* 113* 113* 118* 107*  BUN 91* 89* 96* 94* 105*  CREATININE 5.55* 5.38* 5.55* 5.98* 5.89*  CALCIUM  8.3* 8.2* 8.2* 8.4* 8.2*  MG 1.5* 1.7 1.7  --   --   PHOS 6.1* 6.4*  --   --   --     Liver Function Tests: Recent Labs  Lab 03/04/24 0619  AST 14*  ALT 10  ALKPHOS 58  BILITOT 0.5  PROT 6.4*  ALBUMIN  2.7*   Recent Labs  Lab 03/04/24 0619  LIPASE 47   No results for input(s): AMMONIA in the last 168 hours.  CBC: Recent Labs  Lab 03/04/24 0619 03/06/24 0358 03/07/24 0351 03/08/24 0507  WBC 7.0 6.2 6.8 7.1  NEUTROABS 4.1  --  3.5 3.9  HGB 7.7* 7.0* 7.4* 7.5*  HCT 24.0* 22.2* 23.1* 23.7*  MCV 94.1 92.9 93.9 93.7  PLT 177 167 165 178    Cardiac Enzymes: No results for input(s): CKTOTAL, CKMB, CKMBINDEX, TROPONINI in the last 168 hours.  BNP: Invalid input(s): POCBNP  CBG: Recent Labs  Lab 03/07/24 0953 03/07/24 1140 03/07/24 1636 03/07/24 2000 03/08/24 0755  GLUCAP 207* 227* 95 156* 93    Microbiology: Results for orders placed or performed during the hospital encounter of 11/22/23  Resp panel by RT-PCR (RSV, Flu A&B, Covid) Anterior Nasal Swab     Status: None   Collection Time: 11/22/23  8:26 PM   Specimen: Anterior Nasal Swab  Result Value Ref Range Status  SARS Coronavirus 2 by RT PCR NEGATIVE NEGATIVE Final    Comment: (NOTE) SARS-CoV-2 target nucleic acids are NOT DETECTED.  The SARS-CoV-2 RNA is generally detectable in upper respiratory specimens during the acute phase of infection. The lowest concentration of SARS-CoV-2 viral copies this assay can detect is 138 copies/mL. A negative result does not preclude SARS-Cov-2 infection and should not be used as the sole basis for treatment or other patient management decisions. A negative result may occur with  improper specimen collection/handling, submission of specimen other than nasopharyngeal swab, presence of viral mutation(s) within  the areas targeted by this assay, and inadequate number of viral copies(<138 copies/mL). A negative result must be combined with clinical observations, patient history, and epidemiological information. The expected result is Negative.  Fact Sheet for Patients:  BloggerCourse.com  Fact Sheet for Healthcare Providers:  SeriousBroker.it  This test is no t yet approved or cleared by the [place]  FDA and  has been authorized for detection and/or diagnosis of SARS-CoV-2 by FDA under an Emergency Use Authorization (EUA). This EUA will remain  in effect (meaning this test can be used) for the duration of the COVID-19 declaration under Section 564(b)(1) of the Act, 21 U.S.C.section 360bbb-3(b)(1), unless the authorization is terminated  or revoked sooner.       Influenza A by PCR NEGATIVE NEGATIVE Final   Influenza B by PCR NEGATIVE NEGATIVE Final    Comment: (NOTE) The Xpert Xpress SARS-CoV-2/FLU/RSV plus assay is intended as an aid in the diagnosis of influenza from Nasopharyngeal swab specimens and should not be used as a sole basis for treatment. Nasal washings and aspirates are unacceptable for Xpert Xpress SARS-CoV-2/FLU/RSV testing.  Fact Sheet for Patients: BloggerCourse.com  Fact Sheet for Healthcare Providers: SeriousBroker.it  This test is not yet approved or cleared by the [place]  FDA and has been authorized for detection and/or diagnosis of SARS-CoV-2 by FDA under an Emergency Use Authorization (EUA). This EUA will remain in effect (meaning this test can be used) for the duration of the COVID-19 declaration under Section 564(b)(1) of the Act, 21 U.S.C. section 360bbb-3(b)(1), unless the authorization is terminated or revoked.     Resp Syncytial Virus by PCR NEGATIVE NEGATIVE Final    Comment: (NOTE) Fact Sheet for  Patients: BloggerCourse.com  Fact Sheet for Healthcare Providers: SeriousBroker.it  This test is not yet approved or cleared by the [place]  FDA and has been authorized for detection and/or diagnosis of SARS-CoV-2 by FDA under an Emergency Use Authorization (EUA). This EUA will remain in effect (meaning this test can be used) for the duration of the COVID-19 declaration under Section 564(b)(1) of the Act, 21 U.S.C. section 360bbb-3(b)(1), unless the authorization is terminated or revoked.  Performed at [facility], [address] [place], [place] [address]     Coagulation Studies: No results for input(s): LABPROT, INR in the last 72 hours.  Urinalysis: No results for input(s): COLORURINE, LABSPEC, PHURINE, GLUCOSEU, HGBUR, BILIRUBINUR, KETONESUR, PROTEINUR, UROBILINOGEN, NITRITE, LEUKOCYTESUR in the last 72 hours.  Invalid input(s): APPERANCEUR    Imaging: PERIPHERAL VASCULAR CATHETERIZATION Result Date: 03/07/2024 See surgical note for result.  US  RENAL Result Date: 03/06/2024 CLINICAL DATA:  Acute kidney failure EXAM: RENAL / URINARY TRACT ULTRASOUND COMPLETE COMPARISON:  CT abdomen and pelvis 09/17/2022. FINDINGS: Right Kidney: Renal measurements: 16.3 x 9.3 x 8.5 cm = volume: 673 mL. There is no hydronephrosis. There is increased echogenicity throughout. There are numerous cystic areas throughout the right kidney. The largest is in  the mid kidney measuring 7.8 x 7.0 x 8.9 cm similar to the prior study. There is a solid mass in the lower pole of the right kidney measuring 5.7 x 5.2 x 5.7 cm similar to the prior study. Left Kidney: Left kidney is surgically absent. There is a cystic area in the nephrectomy bed measuring 9.3 x 5.6 cm similar to prior CT. Bladder: Appears normal for degree of bladder distention. Other: None. IMPRESSION: 1. No hydronephrosis. 2. Increased echogenicity of the  right kidney compatible with medical renal disease. 3. Stable solid mass in the lower pole of the right kidney measuring 5.7 x 5.2 x 5.7 cm. 4. Numerous cystic areas throughout the right kidney. 5. Status post left nephrectomy. 6. Stable cystic area in the nephrectomy bed measuring 9.3 x 5.6 cm similar to prior CT. Electronically Signed   By: Greig Pique M.D.   On: 03/06/2024 21:55      Medications:     apixaban   5 mg Oral BID   budesonide -glycopyrrolate -formoterol   2 puff Inhalation BID   carvedilol   12.5 mg Oral BID WC   Chlorhexidine  Gluconate Cloth  6 each Topical Q0600   furosemide   40 mg Intravenous BID   hydrALAZINE   10 mg Oral Q8H   insulin  aspart  0-5 Units Subcutaneous QHS   insulin  aspart  0-9 Units Subcutaneous TID WC   isosorbide  mononitrate  30 mg Oral Daily   lidocaine   1 patch Transdermal Q24H   pantoprazole   40 mg Oral Daily   rosuvastatin   10 mg Oral QHS   sodium bicarbonate   650 mg Oral TID   acetaminophen , alteplase , heparin , ipratropium, LORazepam , methocarbamol , metoprolol  tartrate, ondansetron  (ZOFRAN ) IV  Assessment/ Plan:  [name] is a 68 y.o.  male  with past medical conditions including CHF, COPD, CAD, stroke, diabetes, CKDIV and hypertension.  Patient has been admitted for CHF (congestive heart failure) (HCC) [I50.9] Tongue lesion [K14.8] Acute pain of right shoulder [M25.511]   End stage renal disease on hemodialysis  Due to multiple episodes of shortness of breath and increased volume status. We feel this patient can be well managed on renal recovery therapy. Appreciate vascular surgery for placing HD temp cath Will place a permcath early next week.  Next treatment scheduled for Saturday. Dialysis navigator in contact with [facility] for outpatient dialysis needs.    Lab Results  Component Value Date   CREATININE 5.89 (H) 03/08/2024   CREATININE 5.98 (H) 03/07/2024   CREATININE 5.55 (H) 03/06/2024    Intake/Output Summary (Last  24 hours) at 03/08/2024 1122 Last data filed at 03/08/2024 0805 Gross per 24 hour  Intake 1140 ml  Output 751 ml  Net 389 ml    2.  Acute metabolic acidosis.  Serum bicarb 18 on ED arrival.  Primary team is ordered oral sodium bicarbonate  650 mg 3 times daily.  Serum bicarb correcting. Will consider stopping oral supplementation.   3.  Acute on chronic heart failure.  Most recent echo on 11/05/2023 shows EF 50 to 55% with grade 1 diastolic dysfunction and trivial MVR.  Chest x-ray shows pulmonary vascular congestion.  Continue IV Furosemide   4. Anemia of chronic kidney disease Lab Results  Component Value Date   HGB 7.5 (L) 03/08/2024    Hemoglobin decreased.  Will order Retacrit  10000 units with dialysis yesterday.    LOS: 2 Larry Callahan 9/26/202511:22 AM

## 2024-03-08 NOTE — Progress Notes (Signed)
 Progress Note   Patient: Larry Callahan FMW:[id number] DOB: 06-Mar-1956 DOA: 03/04/2024     2 DOS: the patient was seen and examined on 03/08/2024    Brief hospital course: Larry Callahan is a 68 y.o. male with medical history significant of chronic HFrEF and HFpEF with improved LVEF from 40% to 50% recently, a flutter status post ablation on Eliquis , CKD stage IV HTN, IDDM, COPD, nonobstructive CAD, CVA with residual right-sided weakness, sent from nursing home for evaluation of new onset of right shoulder and right upper arm pain and shortness of breath. Admitted for acute on chronic heart failure.     Assessment and Plan:   Acute on chronic combined HFrEF and HFpEF decompensation - Significant fluid overload on physical exam and x-ray - Reason for decompensation is probably multifactorial from worsening of anemia as well as probably frequent breakthrough of A-fib with RVR. -Troponin 22 -> 23, likely demand - Echo 05/25 EF 50 to 55%, no RWMA.  Grade I diastolic dysfunction - Continue IV Lasix  40 mg twice daily, and monitor kidney function closely - Daily weights, monitor I's/o   A-fib with RVR - s/p Digoxin  - Coreg  increased 12.5 mg bid - Continue Eliquis    Right shoulder pain/right upper arm pain - improved - Clinically suspect pinched nerve from underlying cervical spinal radiculopathy - XR C-spine: Moderate spondylosis of the cervical spine with minimal disc disease at the C3-C4 level - XR right shoulder: Mild degenerative changes of acromioclavicular joint - Outpatient follow up   AKI on CKD stage IV-worsening Metabolic acidosis - Volume overload related, clinically suspect cardiorenal syndrome - Cr 5.55 -> 5.38, Baseline Cr ~ 4.1 - Diuresis as above - Plan of care discussed with nephrologist - Nephrologist initiated hemodialysis in this hospitalization   Acute on chronic anemia secondary to CKD Symptomatic anemia - Patient refused PRBC and all other  blood products - Check iron  study and reticulocyte count, will consider IV iron  versus EPO infusion if indicated.   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 Normal respiratory effort. No accessory muscle use.  Cardiovascular: RRR, no murmurs / rubs / gallops.  1+ extremity edema Abdomen: no tenderness, no masses palpated. No hepatosplenomegaly. Bowel sounds positive.  Musculoskeletal: Tenderness on right shoulder muscle and deltoid and decreased light touch sensation of right shoulder and right upper arm Skin: no rashes, lesions, ulcers. No induration Neurologic: AO x3, No gross focal deficits     Data Reviewed:     Vitals:   03/08/24 1139 03/08/24 1145 03/08/24 1237 03/08/24 1600  BP:  (!) 140/63 127/85 (!) 154/69  Pulse: 68 70 72   Resp: 18 17    Temp:  98.3 F (36.8 C) 98.2 F (36.8 C) 97.7 F (36.5 C)  TempSrc:  Oral  Oral  SpO2: 100% 100%  100% 100%  Weight:  86.4 kg    Height:          Latest Ref Rng & Units 03/08/2024    5:07 AM 03/07/2024    3:51 AM 03/06/2024    3:58 AM  BMP  Glucose 70 - 99 mg/dL 892  881  886   BUN 8 - 23 mg/dL 894  94  96   Creatinine 0.61 - 1.24 mg/dL 4.10  4.01  4.44   Sodium 135 - 145 mmol/L 139  139  140   Potassium 3.5 - 5.1  mmol/L 4.9  4.9  5.0   Chloride 98 - 111 mmol/L 106  107  108   CO2 22 - 32 mmol/L 20  20  20    Calcium  8.9 - 10.3 mg/dL 8.2  8.4  8.2      Author: Drue ONEIDA Potter, MD 03/08/2024 5:43 PM  For on call review [email or web address].

## 2024-03-09 DIAGNOSIS — N186 End stage renal disease: Secondary | ICD-10-CM | POA: Diagnosis not present

## 2024-03-09 DIAGNOSIS — Z992 Dependence on renal dialysis: Secondary | ICD-10-CM

## 2024-03-09 LAB — CBC WITH DIFFERENTIAL/PLATELET
Abs Immature Granulocytes: 0.04 K/uL (ref 0.00–0.07)
Basophils Absolute: 0.1 K/uL (ref 0.0–0.1)
Basophils Relative: 1 %
Eosinophils Absolute: 0.2 K/uL (ref 0.0–0.5)
Eosinophils Relative: 3 %
HCT: 22.8 % — ABNORMAL LOW (ref 39.0–52.0)
Hemoglobin: 7.3 g/dL — ABNORMAL LOW (ref 13.0–17.0)
Immature Granulocytes: 1 %
Lymphocytes Relative: 31 %
Lymphs Abs: 2.3 K/uL (ref 0.7–4.0)
MCH: 29.9 pg (ref 26.0–34.0)
MCHC: 32 g/dL (ref 30.0–36.0)
MCV: 93.4 fL (ref 80.0–100.0)
Monocytes Absolute: 0.7 K/uL (ref 0.1–1.0)
Monocytes Relative: 9 %
Neutro Abs: 4.2 K/uL (ref 1.7–7.7)
Neutrophils Relative %: 55 %
Platelets: 185 K/uL (ref 150–400)
RBC: 2.44 MIL/uL — ABNORMAL LOW (ref 4.22–5.81)
RDW: 14.6 % (ref 11.5–15.5)
WBC: 7.4 K/uL (ref 4.0–10.5)
nRBC: 0 % (ref 0.0–0.2)

## 2024-03-09 LAB — BASIC METABOLIC PANEL WITH GFR
Anion gap: 10 (ref 5–15)
BUN: 86 mg/dL — ABNORMAL HIGH (ref 8–23)
CO2: 22 mmol/L (ref 22–32)
Calcium: 8 mg/dL — ABNORMAL LOW (ref 8.9–10.3)
Chloride: 105 mmol/L (ref 98–111)
Creatinine, Ser: 4.46 mg/dL — ABNORMAL HIGH (ref 0.61–1.24)
GFR, Estimated: 13 mL/min — ABNORMAL LOW (ref >60)
Glucose, Bld: 105 mg/dL — ABNORMAL HIGH (ref 70–99)
Potassium: 4.6 mmol/L (ref 3.5–5.1)
Sodium: 137 mmol/L (ref 135–145)

## 2024-03-09 LAB — GLUCOSE, CAPILLARY
Glucose-Capillary: 132 mg/dL — ABNORMAL HIGH (ref 70–99)
Glucose-Capillary: 221 mg/dL — ABNORMAL HIGH (ref 70–99)
Glucose-Capillary: 157 mg/dL — ABNORMAL HIGH (ref 70–99)

## 2024-03-09 MED ORDER — CARVEDILOL 6.25 MG PO TABS
6.2500 mg | ORAL_TABLET | Freq: Two times a day (BID) | ORAL | Status: DC
  Administered 2024-03-09 – 2024-03-12 (×6): 6.25 mg via ORAL
  Filled 2024-03-09 (×6): qty 1

## 2024-03-09 MED ORDER — HEPARIN SODIUM (PORCINE) 1000 UNIT/ML IJ SOLN
INTRAMUSCULAR | Status: AC
  Filled 2024-03-09: qty 4

## 2024-03-09 MED ORDER — EPOETIN ALFA-EPBX 10000 UNIT/ML IJ SOLN
INTRAMUSCULAR | Status: AC
  Filled 2024-03-09: qty 1

## 2024-03-09 MED ORDER — HEPARIN SODIUM (PORCINE) 1000 UNIT/ML DIALYSIS: 1000.0000 [IU] | INTRAMUSCULAR | Status: DC | PRN

## 2024-03-09 MED ORDER — TORSEMIDE 20 MG PO TABS
20.0000 mg | ORAL_TABLET | Freq: Every day | ORAL | Status: DC
  Administered 2024-03-09 – 2024-03-12 (×4): 20 mg via ORAL
  Filled 2024-03-09 (×4): qty 1

## 2024-03-09 MED ORDER — ALTEPLASE 2 MG IJ SOLR: 2.0000 mg | Freq: Once | INTRAMUSCULAR | Status: DC | PRN

## 2024-03-09 NOTE — Progress Notes (Addendum)
  Received patient in bed to unit.   Informed consent signed and in chart.    TX duration:2.50 hrs     Transported by  Hand-off given to patient's nurse. Paused tx at the last 27 mins bp dropped to 77/59, 200 saline was given. Pt c/o dizziness, hot and was sweaty.notified MD Korrapati. recheck 104/59 and    Access used: Right femoral cath Access issues: none   Total UF removed: 500 Medication(s) given: Epo 10,000 u Post HD VS: wnl Post HD weight: 85.5 kg     N. Masaki Rothbauer LPN [facility]

## 2024-03-09 NOTE — Progress Notes (Addendum)
 [facility]  PROGRESS NOTE   Subjective:   Patient seen on dialysis.  Denies any chest pain or shortness of breath.  During the end of the treatment patient developed dizziness and his blood pressure dropped.  He was given isotonic saline with improvement in blood pressure.  He was taken off earlier from the machine  Objective:  Vital signs: Blood pressure 135/64, pulse 67, temperature 97.7 F (36.5 C), temperature source Oral, resp. rate 16, height 5' 11 (1.803 m), weight 85.5 kg, SpO2 100%.  Intake/Output Summary (Last 24 hours) at 03/09/2024 1154 Last data filed at 03/09/2024 1113 Gross per 24 hour  Intake 180 ml  Output 1287.5 ml  Net -1107.5 ml   Filed Weights   03/09/24 0500 03/09/24 0801 03/09/24 1111  Weight: 88.7 kg 86 kg 85.5 kg     Physical Exam: General:  No acute distress  Head:  Normocephalic, atraumatic. Moist oral mucosal membranes  Eyes:  Anicteric  Neck:  Supple  Lungs:   Clear to auscultation, normal effort  Heart:  S1S2 no rubs  Abdomen:   Soft, nontender, bowel sounds present  Extremities:  peripheral edema.  Neurologic:  Awake, alert, following commands  Skin:  No lesions  Access:     Basic Metabolic Panel: Recent Labs  Lab 03/04/24 0619 03/05/24 0353 03/06/24 0358 03/07/24 0351 03/08/24 0507 03/09/24 0341  NA 140 138 140 139 139 137  K 4.9 4.6 5.0 4.9 4.9 4.6  CL 108 109 108 107 106 105  CO2 18* 18* 20* 20* 20* 22  GLUCOSE 141* 113* 113* 118* 107* 105*  BUN 91* 89* 96* 94* 105* 86*  CREATININE 5.55* 5.38* 5.55* 5.98* 5.89* 4.46*  CALCIUM  8.3* 8.2* 8.2* 8.4* 8.2* 8.0*  MG 1.5* 1.7 1.7  --   --   --   PHOS 6.1* 6.4*  --   --   --   --    GFR: Estimated Creatinine Clearance: 16.4 mL/min (A) (by C-G formula based on SCr of 4.46 mg/dL (H)).  Liver Function Tests: Recent Labs  Lab 03/04/24 0619  AST 14*  ALT 10  ALKPHOS 58  BILITOT 0.5  PROT 6.4*  ALBUMIN  2.7*   Recent Labs  Lab 03/04/24 0619  LIPASE 47   No  results for input(s): AMMONIA in the last 168 hours.  CBC: Recent Labs  Lab 03/04/24 0619 03/06/24 0358 03/07/24 0351 03/08/24 0507 03/09/24 0341  WBC 7.0 6.2 6.8 7.1 7.4  NEUTROABS 4.1  --  3.5 3.9 4.2  HGB 7.7* 7.0* 7.4* 7.5* 7.3*  HCT 24.0* 22.2* 23.1* 23.7* 22.8*  MCV 94.1 92.9 93.9 93.7 93.4  PLT 177 167 165 178 185     HbA1C: Hemoglobin A1C  Date/Time Value Ref Range Status  09/18/2023 01:43 PM 6.7 (A) 4.0 - 5.6 % Final  12/29/2022 08:52 AM 5.8 (A) 4.0 - 5.6 % Final  12/06/2012 07:32 AM 6.9 (H) 4.2 - 6.3 % Final    Comment:    The American Diabetes Association recommends that a primary goal of therapy should be <7% and that physicians should reevaluate the treatment regimen in patients with HbA1c values consistently >8%.   09/05/2011 09:35 AM 11.1 (H) 4.2 - 6.3 % Final    Comment:    The American Diabetes Association recommends that a primary goal of therapy should be <7% and that physicians should reevaluate the treatment regimen in patients with HbA1c values consistently >8%.    Hgb A1c MFr Bld  Date/Time Value  Ref Range Status  08/06/2021 03:25 AM 7.0 (H) 4.8 - 5.6 % Final    Comment:    (NOTE)         Prediabetes: 5.7 - 6.4         Diabetes: >6.4         Glycemic control for adults with diabetes: <7.0   01/07/2021 09:55 PM 6.2 (H) 4.8 - 5.6 % Final    Comment:    (NOTE)         Prediabetes: 5.7 - 6.4         Diabetes: >6.4         Glycemic control for adults with diabetes: <7.0     Urinalysis: No results for input(s): COLORURINE, LABSPEC, PHURINE, GLUCOSEU, HGBUR, BILIRUBINUR, KETONESUR, PROTEINUR, UROBILINOGEN, NITRITE, LEUKOCYTESUR in the last 72 hours.  Invalid input(s): APPERANCEUR    Imaging: PERIPHERAL VASCULAR CATHETERIZATION Result Date: 03/07/2024 See surgical note for result.    Medications:     apixaban   5 mg Oral BID   budesonide -glycopyrrolate -formoterol   2 puff Inhalation BID   carvedilol   12.5  mg Oral BID WC   Chlorhexidine  Gluconate Cloth  6 each Topical Q0600   furosemide   40 mg Intravenous BID   hydrALAZINE   10 mg Oral Q8H   insulin  aspart  0-5 Units Subcutaneous QHS   insulin  aspart  0-9 Units Subcutaneous TID WC   isosorbide  mononitrate  30 mg Oral Daily   lidocaine   1 patch Transdermal Q24H   pantoprazole   40 mg Oral Daily   rosuvastatin   10 mg Oral QHS   sodium bicarbonate   650 mg Oral TID    Assessment/ Plan:     68 y.o. male with past medical conditions including CHF, COPD, CAD, stroke, diabetes, CKDIV and hypertension, who was admitted for CHF (congestive heart failure) and his renal function has been worsening.  Patient consented for renal replacement therapy.  #1: ESRD: Patient tolerated dialysis initially.  Subsequently became hypotensive and was taken of the machine.  Permacath works well.  Will discontinue the furosemide  and also decrease the dose of carvedilol  to 6.25 mg twice a day.  #2: Metabolic acidosis: Patient's bicarb improved with dialysis.  Will discontinue sodium bicarbonate .  #3: Anemia: Patient needs iron  supplementation and also Retacrit .  #4: Secondary hyperparathyroidism: Will follow PTH, calcium  and phosphorus levels.  #5: Diabetes: Continue insulin  as ordered.  #6: Hypertension: Patient has been on carvedilol , furosemide , hydralazine  and Imdur .  Will decrease the dose of carvedilol  to 6.25 mg twice a day.  Will also discontinue the IV furosemide  and start on torsemide at 20 mg orally daily.  Patient is advised on the importance of fluid restriction.  Labs and medications reviewed. Will continue to follow along with you.   LOS: 3 Pinkey Edman, MD [facility] 9/27/202511:54 AM

## 2024-03-09 NOTE — Progress Notes (Addendum)
 Progress Note   Patient: Larry Callahan FMW:[id number] DOB: 1955-06-28 DOA: 03/04/2024     3 DOS: the patient was seen and examined on 03/09/2024     Brief hospital course: Refugio Vandevoorde Mikkelsen is a 68 y.o. male with medical history significant of chronic HFrEF and HFpEF with improved LVEF from 40% to 50% recently, a flutter status post ablation on Eliquis , CKD stage IV HTN, IDDM, COPD, nonobstructive CAD, CVA with residual right-sided weakness, sent from nursing home for evaluation of new onset of right shoulder and right upper arm pain and shortness of breath. Admitted for acute on chronic heart failure.     Assessment and Plan:   Acute on chronic combined HFrEF and HFpEF decompensation - Significant fluid overload on physical exam and x-ray - Reason for decompensation is probably multifactorial from worsening of anemia as well as probably frequent breakthrough of A-fib with RVR. -Troponin 22 -> 23, likely demand - Echo 05/25 EF 50 to 55%, no RWMA.  Grade I diastolic dysfunction Continue torsemide orally - Daily weights, monitor I's/o   A-fib with RVR - s/p Digoxin  - Coreg  increased 12.5 mg bid - Continue Eliquis    Right shoulder pain/right upper arm pain - improved - Clinically suspect pinched nerve from underlying cervical spinal radiculopathy - XR C-spine: Moderate spondylosis of the cervical spine with minimal disc disease at the C3-C4 level - XR right shoulder: Mild degenerative changes of acromioclavicular joint - Outpatient follow up   ESRD on hemodialysis Metabolic acidosis - Volume overload related, clinically suspect cardiorenal syndrome - Cr 5.55 -> 5.38, Baseline Cr ~ 4.1 - Diuresis as above - Plan of care discussed with nephrologist - Nephrologist initiated hemodialysis in this hospitalization   Acute on chronic anemia secondary to CKD Symptomatic anemia - Patient refused PRBC and all other blood products - Check iron  study and reticulocyte count,  will consider IV iron  versus EPO infusion if indicated.   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 - No new neurological deficits, continue Eliquis  and statin   DVT prophylaxis: Eliquis    Subjective:  Patient now on hemodialysis Denies nausea vomiting chest pain   Physical Exam:   Constitutional: NAD, calm, comfortable  Neck: normal, supple, no masses, no thyromegaly Respiratory: clear to auscultation bilaterally, no wheezing, no crackles. Normal respiratory effort. No accessory muscle use.  Cardiovascular: RRR, no murmurs / rubs / gallops.  1+ extremity edema Abdomen: no tenderness, no masses palpated. No hepatosplenomegaly. Bowel sounds positive.  Musculoskeletal: Tenderness on right shoulder muscle and deltoid and decreased light touch sensation of right shoulder and right upper arm Skin: no rashes, lesions, ulcers. No induration Neurologic: AO x3, No gross focal deficits     Data Reviewed:      Vitals:   03/09/24 1100 03/09/24 1111 03/09/24 1146 03/09/24 1240  BP: 124/87  135/64 (!) 198/96  Pulse: 68  67 61  Resp: 20  16 17   Temp: 98.1 F (36.7 C)  97.7 F (36.5 C) 97.6 F (36.4 C)  TempSrc: Oral  Oral Oral  SpO2: 100%  100% 96%  Weight:  85.5 kg    Height:          Latest Ref Rng & Units 03/09/2024    3:41  AM 03/08/2024    5:07 AM 03/07/2024    3:51 AM  CBC  WBC 4.0 - 10.5 K/uL 7.4  7.1  6.8   Hemoglobin 13.0 - 17.0 g/dL 7.3  7.5  7.4   Hematocrit 39.0 - 52.0 % 22.8  23.7  23.1   Platelets 150 - 400 K/uL 185  178  165        Latest Ref Rng & Units 03/09/2024    3:41 AM 03/08/2024    5:07 AM 03/07/2024    3:51 AM  BMP  Glucose 70 - 99 mg/dL 894  892  881   BUN 8 - 23 mg/dL 86  894  94   Creatinine 0.61 - 1.24 mg/dL  5.53  4.10  4.01   Sodium 135 - 145 mmol/L 137  139  139   Potassium 3.5 - 5.1 mmol/L 4.6  4.9  4.9   Chloride 98 - 111 mmol/L 105  106  107   CO2 22 - 32 mmol/L 22  20  20    Calcium  8.9 - 10.3 mg/dL 8.0  8.2  8.4      Author: Drue ONEIDA Potter, MD 03/09/2024 3:01 PM  For on call review [email or web address].

## 2024-03-09 NOTE — Plan of Care (Signed)

## 2024-03-10 DIAGNOSIS — Z992 Dependence on renal dialysis: Secondary | ICD-10-CM | POA: Diagnosis not present

## 2024-03-10 DIAGNOSIS — N186 End stage renal disease: Secondary | ICD-10-CM | POA: Diagnosis not present

## 2024-03-10 LAB — CBC WITH DIFFERENTIAL/PLATELET
Abs Immature Granulocytes: 0.08 K/uL — ABNORMAL HIGH (ref 0.00–0.07)
Basophils Absolute: 0.1 K/uL (ref 0.0–0.1)
Basophils Relative: 1 %
Eosinophils Absolute: 0.2 K/uL (ref 0.0–0.5)
Eosinophils Relative: 3 %
HCT: 22.9 % — ABNORMAL LOW (ref 39.0–52.0)
Hemoglobin: 7.3 g/dL — ABNORMAL LOW (ref 13.0–17.0)
Immature Granulocytes: 1 %
Lymphocytes Relative: 30 %
Lymphs Abs: 2.4 K/uL (ref 0.7–4.0)
MCH: 29.8 pg (ref 26.0–34.0)
MCHC: 31.9 g/dL (ref 30.0–36.0)
MCV: 93.5 fL (ref 80.0–100.0)
Monocytes Absolute: 0.7 K/uL (ref 0.1–1.0)
Monocytes Relative: 9 %
Neutro Abs: 4.5 K/uL (ref 1.7–7.7)
Neutrophils Relative %: 56 %
Platelets: 183 K/uL (ref 150–400)
RBC: 2.45 MIL/uL — ABNORMAL LOW (ref 4.22–5.81)
RDW: 14.7 % (ref 11.5–15.5)
WBC: 8 K/uL (ref 4.0–10.5)
nRBC: 0 % (ref 0.0–0.2)

## 2024-03-10 LAB — BASIC METABOLIC PANEL WITH GFR
Anion gap: 8 (ref 5–15)
BUN: 67 mg/dL — ABNORMAL HIGH (ref 8–23)
CO2: 23 mmol/L (ref 22–32)
Calcium: 7.8 mg/dL — ABNORMAL LOW (ref 8.9–10.3)
Chloride: 104 mmol/L (ref 98–111)
Creatinine, Ser: 4.01 mg/dL — ABNORMAL HIGH (ref 0.61–1.24)
GFR, Estimated: 15 mL/min — ABNORMAL LOW (ref >60)
Glucose, Bld: 119 mg/dL — ABNORMAL HIGH (ref 70–99)
Potassium: 4.4 mmol/L (ref 3.5–5.1)
Sodium: 135 mmol/L (ref 135–145)

## 2024-03-10 LAB — GLUCOSE, CAPILLARY
Glucose-Capillary: 113 mg/dL — ABNORMAL HIGH (ref 70–99)
Glucose-Capillary: 169 mg/dL — ABNORMAL HIGH (ref 70–99)
Glucose-Capillary: 155 mg/dL — ABNORMAL HIGH (ref 70–99)
Glucose-Capillary: 166 mg/dL — ABNORMAL HIGH (ref 70–99)

## 2024-03-10 NOTE — Progress Notes (Signed)
 [facility]  PROGRESS NOTE   Subjective:   Patient seen at bedside.  Denies any chest pain or shortness of breath.  Blood pressure much better today.  Objective:  Vital signs: Blood pressure (!) 149/72, pulse 73, temperature 97.8 F (36.6 C), resp. rate 18, height 5' 11 (1.803 m), weight 83.9 kg, SpO2 100%.  Intake/Output Summary (Last 24 hours) at 03/10/2024 1508 Last data filed at 03/10/2024 1400 Gross per 24 hour  Intake --  Output 975 ml  Net -975 ml   Filed Weights   03/09/24 0801 03/09/24 1111 03/10/24 0318  Weight: 86 kg 85.5 kg 83.9 kg     Physical Exam: General:  No acute distress  Head:  Normocephalic, atraumatic. Moist oral mucosal membranes  Eyes:  Anicteric  Neck:  Supple  Lungs:   Clear to auscultation, normal effort  Heart:  S1S2 no rubs  Abdomen:   Soft, nontender, bowel sounds present  Extremities:  peripheral edema.  Neurologic:  Awake, alert, following commands  Skin:  No lesions  Access:     Basic Metabolic Panel: Recent Labs  Lab 03/04/24 0619 03/05/24 0353 03/06/24 0358 03/07/24 0351 03/08/24 0507 03/09/24 0341 03/10/24 0438  NA 140 138 140 139 139 137 135  K 4.9 4.6 5.0 4.9 4.9 4.6 4.4  CL 108 109 108 107 106 105 104  CO2 18* 18* 20* 20* 20* 22 23  GLUCOSE 141* 113* 113* 118* 107* 105* 119*  BUN 91* 89* 96* 94* 105* 86* 67*  CREATININE 5.55* 5.38* 5.55* 5.98* 5.89* 4.46* 4.01*  CALCIUM  8.3* 8.2* 8.2* 8.4* 8.2* 8.0* 7.8*  MG 1.5* 1.7 1.7  --   --   --   --   PHOS 6.1* 6.4*  --   --   --   --   --    GFR: Estimated Creatinine Clearance: 18.3 mL/min (A) (by C-G formula based on SCr of 4.01 mg/dL (H)).  Liver Function Tests: Recent Labs  Lab 03/04/24 0619  AST 14*  ALT 10  ALKPHOS 58  BILITOT 0.5  PROT 6.4*  ALBUMIN  2.7*   Recent Labs  Lab 03/04/24 0619  LIPASE 47   No results for input(s): AMMONIA in the last 168 hours.  CBC: Recent Labs  Lab 03/04/24 0619 03/06/24 0358 03/07/24 0351  03/08/24 0507 03/09/24 0341 03/10/24 0438  WBC 7.0 6.2 6.8 7.1 7.4 8.0  NEUTROABS 4.1  --  3.5 3.9 4.2 4.5  HGB 7.7* 7.0* 7.4* 7.5* 7.3* 7.3*  HCT 24.0* 22.2* 23.1* 23.7* 22.8* 22.9*  MCV 94.1 92.9 93.9 93.7 93.4 93.5  PLT 177 167 165 178 185 183     HbA1C: Hemoglobin A1C  Date/Time Value Ref Range Status  09/18/2023 01:43 PM 6.7 (A) 4.0 - 5.6 % Final  12/29/2022 08:52 AM 5.8 (A) 4.0 - 5.6 % Final  12/06/2012 07:32 AM 6.9 (H) 4.2 - 6.3 % Final    Comment:    The American Diabetes Association recommends that a primary goal of therapy should be <7% and that physicians should reevaluate the treatment regimen in patients with HbA1c values consistently >8%.   09/05/2011 09:35 AM 11.1 (H) 4.2 - 6.3 % Final    Comment:    The American Diabetes Association recommends that a primary goal of therapy should be <7% and that physicians should reevaluate the treatment regimen in patients with HbA1c values consistently >8%.    Hgb A1c MFr Bld  Date/Time Value Ref Range Status  08/06/2021 03:25 AM 7.0 (  H) 4.8 - 5.6 % Final    Comment:    (NOTE)         Prediabetes: 5.7 - 6.4         Diabetes: >6.4         Glycemic control for adults with diabetes: <7.0   01/07/2021 09:55 PM 6.2 (H) 4.8 - 5.6 % Final    Comment:    (NOTE)         Prediabetes: 5.7 - 6.4         Diabetes: >6.4         Glycemic control for adults with diabetes: <7.0     Urinalysis: No results for input(s): COLORURINE, LABSPEC, PHURINE, GLUCOSEU, HGBUR, BILIRUBINUR, KETONESUR, PROTEINUR, UROBILINOGEN, NITRITE, LEUKOCYTESUR in the last 72 hours.  Invalid input(s): APPERANCEUR    Imaging: No results found.   Medications:     apixaban   5 mg Oral BID   budesonide -glycopyrrolate -formoterol   2 puff Inhalation BID   carvedilol   6.25 mg Oral BID WC   Chlorhexidine  Gluconate Cloth  6 each Topical Q0600   hydrALAZINE   10 mg Oral Q8H   insulin  aspart  0-5 Units Subcutaneous QHS   insulin   aspart  0-9 Units Subcutaneous TID WC   isosorbide  mononitrate  30 mg Oral Daily   lidocaine   1 patch Transdermal Q24H   pantoprazole   40 mg Oral Daily   rosuvastatin   10 mg Oral QHS   torsemide  20 mg Oral Daily    Assessment/ Plan:     68 y.o. male with past medical conditions including CHF, COPD, CAD, stroke, diabetes, CKDIV and hypertension, who was admitted for CHF (congestive heart failure) and his renal function has been worsening.  Patient consented for renal replacement therapy.   #1: ESRD: Patient tolerated dialysis initially.  Subsequently became hypotensive and was taken of the machine yesterday.  Permacath worked well.     #2: Metabolic acidosis: Patient's bicarb improved with dialysis.  Will discontinue sodium bicarbonate .   #3: Anemia: Patient needs iron  supplementation and also Retacrit .   #4: Secondary hyperparathyroidism: Will follow PTH, calcium  and phosphorus levels.   #5: Diabetes: Continue insulin  as ordered.   #6: Hypertension: Patient has been on carvedilol , furosemide , hydralazine  and Imdur .  Blood pressure improved today. Continue the carvedilol  at 6.25 mg twice a day.  Will also discontinue the IV furosemide  and start on torsemide at 20 mg orally daily.  Patient is advised on the importance of fluid restriction.  Labs and medications reviewed. Will continue to follow along with you.   LOS: 4 Pinkey Edman, MD [facility] 9/28/20253:08 PM

## 2024-03-10 NOTE — Plan of Care (Signed)

## 2024-03-10 NOTE — Progress Notes (Signed)
 Progress Note   Patient: Larry Callahan FMW:[id number] DOB: [birth year] DOA: 03/04/2024     4 DOS: the patient was seen and examined on 03/10/2024      Brief hospital course: Larry Callahan is a 68 y.o. male with medical history significant of chronic HFrEF and HFpEF with improved LVEF from 40% to 50% recently, a flutter status post ablation on Eliquis , CKD stage IV HTN, IDDM, COPD, nonobstructive CAD, CVA with residual right-sided weakness, sent from nursing home for evaluation of new onset of right shoulder and right upper arm pain and shortness of breath. Admitted for acute on chronic heart failure.     Assessment and Plan:   Acute on chronic combined HFrEF and HFpEF decompensation - Significant fluid overload on physical exam and x-ray - Reason for decompensation is probably multifactorial from worsening of anemia as well as probably frequent breakthrough of A-fib with RVR. -Troponin 22 -> 23, likely demand - Echo 05/25 EF 50 to 55%, no RWMA.  Grade I diastolic dysfunction Continue torsemide orally - Daily weights, monitor I's/o   A-fib with RVR - s/p Digoxin  - Coreg  increased 12.5 mg bid - Continue Eliquis    Right shoulder pain/right upper arm pain - improved - Clinically suspect pinched nerve from underlying cervical spinal radiculopathy - XR C-spine: Moderate spondylosis of the cervical spine with minimal disc disease at the C3-C4 level - XR right shoulder: Mild degenerative changes of acromioclavicular joint - Outpatient follow up   ESRD on hemodialysis Metabolic acidosis - Volume overload related, clinically suspect cardiorenal syndrome - Cr 5.55 -> 5.38, Baseline Cr ~ 4.1 - Diuresis as above - Plan of care discussed with nephrologist - Nephrologist initiated hemodialysis in this hospitalization   Acute on chronic anemia secondary to CKD Symptomatic anemia - Patient refused PRBC and all other blood products - Check iron  study and reticulocyte count,  will consider IV iron  versus EPO infusion if indicated.   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 - No new neurological deficits, continue Eliquis  and statin   DVT prophylaxis: Eliquis    Subjective:  Nephrology still having rounds of dialysis with patient He denies any acute overnight complaint   Physical Exam:   Constitutional: NAD, calm, comfortable  Neck: normal, supple, no masses, no thyromegaly Respiratory: clear to auscultation bilaterally, no wheezing, no crackles. Normal respiratory effort. No accessory muscle use.  Cardiovascular: RRR, no murmurs / rubs / gallops.  1+ extremity edema Abdomen: no tenderness, no masses palpated. No hepatosplenomegaly. Bowel sounds positive.  Musculoskeletal: Tenderness on right shoulder muscle and deltoid and decreased light touch sensation of right shoulder and right upper arm Skin: no rashes, lesions, ulcers. No induration Neurologic: AO x3, No gross focal deficits     Data Reviewed:        Vitals:   03/10/24 0315 03/10/24 0318 03/10/24 0745 03/10/24 1147  BP: (!) 146/69  (!) 161/71 (!) 149/72  Pulse: 73  73 73  Resp: 16  20 18   Temp: 98.9 F (37.2 C)  (!) 97.4 F (36.3 C) 97.8 F (36.6 C)  TempSrc: Oral  Oral   SpO2: 100%  97% 100%  Weight:  83.9 kg    Height:  Latest Ref Rng & Units 03/10/2024    4:38 AM 03/09/2024    3:41 AM 03/08/2024    5:07 AM  BMP  Glucose 70 - 99 mg/dL 880  894  892   BUN 8 - 23 mg/dL 67  86  894   Creatinine 0.61 - 1.24 mg/dL 5.98  5.53  4.10   Sodium 135 - 145 mmol/L 135  137  139   Potassium 3.5 - 5.1 mmol/L 4.4  4.6  4.9   Chloride 98 - 111 mmol/L 104  105  106   CO2 22 - 32 mmol/L 23  22  20    Calcium  8.9 - 10.3 mg/dL 7.8  8.0  8.2         Latest Ref Rng & Units 03/10/2024    4:38 AM 03/09/2024    3:41 AM 03/08/2024    5:07 AM  CBC  WBC 4.0 - 10.5 K/uL 8.0  7.4  7.1   Hemoglobin 13.0 - 17.0 g/dL 7.3  7.3  7.5   Hematocrit 39.0 - 52.0 % 22.9  22.8  23.7   Platelets 150 - 400 K/uL 183  185  178      Author: Drue ONEIDA Potter, MD 03/10/2024 3:45 PM  For on call review [email or web address].

## 2024-03-11 DIAGNOSIS — Z95828 Presence of other vascular implants and grafts: Secondary | ICD-10-CM

## 2024-03-11 DIAGNOSIS — N189 Chronic kidney disease, unspecified: Secondary | ICD-10-CM | POA: Diagnosis not present

## 2024-03-11 DIAGNOSIS — N186 End stage renal disease: Secondary | ICD-10-CM | POA: Diagnosis not present

## 2024-03-11 DIAGNOSIS — Z992 Dependence on renal dialysis: Secondary | ICD-10-CM | POA: Diagnosis not present

## 2024-03-11 DIAGNOSIS — Z9889 Other specified postprocedural states: Secondary | ICD-10-CM | POA: Diagnosis not present

## 2024-03-11 LAB — GLUCOSE, CAPILLARY
Glucose-Capillary: 127 mg/dL — ABNORMAL HIGH (ref 70–99)
Glucose-Capillary: 133 mg/dL — ABNORMAL HIGH (ref 70–99)
Glucose-Capillary: 146 mg/dL — ABNORMAL HIGH (ref 70–99)
Glucose-Capillary: 215 mg/dL — ABNORMAL HIGH (ref 70–99)

## 2024-03-11 LAB — CBC WITH DIFFERENTIAL/PLATELET
Abs Immature Granulocytes: 0.06 K/uL (ref 0.00–0.07)
Basophils Absolute: 0.1 K/uL (ref 0.0–0.1)
Basophils Relative: 1 %
Eosinophils Absolute: 0.2 K/uL (ref 0.0–0.5)
Eosinophils Relative: 3 %
HCT: 24.3 % — ABNORMAL LOW (ref 39.0–52.0)
Hemoglobin: 7.9 g/dL — ABNORMAL LOW (ref 13.0–17.0)
Immature Granulocytes: 1 %
Lymphocytes Relative: 30 %
Lymphs Abs: 2.6 K/uL (ref 0.7–4.0)
MCH: 29.9 pg (ref 26.0–34.0)
MCHC: 32.5 g/dL (ref 30.0–36.0)
MCV: 92 fL (ref 80.0–100.0)
Monocytes Absolute: 0.7 K/uL (ref 0.1–1.0)
Monocytes Relative: 9 %
Neutro Abs: 4.9 K/uL (ref 1.7–7.7)
Neutrophils Relative %: 56 %
Platelets: 189 K/uL (ref 150–400)
RBC: 2.64 MIL/uL — ABNORMAL LOW (ref 4.22–5.81)
RDW: 14.5 % (ref 11.5–15.5)
WBC: 8.6 K/uL (ref 4.0–10.5)
nRBC: 0 % (ref 0.0–0.2)

## 2024-03-11 LAB — BASIC METABOLIC PANEL WITH GFR
Anion gap: 11 (ref 5–15)
BUN: 86 mg/dL — ABNORMAL HIGH (ref 8–23)
CO2: 22 mmol/L (ref 22–32)
Calcium: 8.4 mg/dL — ABNORMAL LOW (ref 8.9–10.3)
Chloride: 105 mmol/L (ref 98–111)
Creatinine, Ser: 4.92 mg/dL — ABNORMAL HIGH (ref 0.61–1.24)
GFR, Estimated: 12 mL/min — ABNORMAL LOW (ref >60)
Glucose, Bld: 153 mg/dL — ABNORMAL HIGH (ref 70–99)
Potassium: 4.6 mmol/L (ref 3.5–5.1)
Sodium: 138 mmol/L (ref 135–145)

## 2024-03-11 MED ORDER — HEPARIN SODIUM (PORCINE) 1000 UNIT/ML DIALYSIS: 1000.0000 [IU] | INTRAMUSCULAR | Status: DC | PRN

## 2024-03-11 MED ORDER — METHYLPREDNISOLONE SODIUM SUCC 125 MG IJ SOLR: 125.0000 mg | Freq: Once | INTRAMUSCULAR | Status: DC | PRN

## 2024-03-11 MED ORDER — HEPARIN SODIUM (PORCINE) 1000 UNIT/ML IJ SOLN
INTRAMUSCULAR | Status: AC
Start: 2024-03-11 — End: 2024-03-11
  Filled 2024-03-11: qty 4

## 2024-03-11 MED ORDER — EPOETIN ALFA-EPBX 10000 UNIT/ML IJ SOLN
INTRAMUSCULAR | Status: AC
  Filled 2024-03-11: qty 1

## 2024-03-11 MED ORDER — FAMOTIDINE 20 MG PO TABS: 40.0000 mg | ORAL_TABLET | Freq: Once | ORAL | Status: DC | PRN

## 2024-03-11 MED ORDER — DIPHENHYDRAMINE HCL 50 MG/ML IJ SOLN: 50.0000 mg | Freq: Once | INTRAMUSCULAR | Status: DC | PRN

## 2024-03-11 MED ORDER — ORAL CARE MOUTH RINSE: 15.0000 mL | OROMUCOSAL | Status: DC | PRN

## 2024-03-11 MED ORDER — VANCOMYCIN HCL IN DEXTROSE 1-5 GM/200ML-% IV SOLN
1000.0000 mg | INTRAVENOUS | Status: AC
  Administered 2024-03-12: 1000 mg via INTRAVENOUS
  Filled 2024-03-11 (×2): qty 200

## 2024-03-11 MED ORDER — ALTEPLASE 2 MG IJ SOLR: 2.0000 mg | Freq: Once | INTRAMUSCULAR | Status: DC | PRN

## 2024-03-11 MED ORDER — SODIUM CHLORIDE 0.9 % IV SOLN: INTRAVENOUS | Status: DC

## 2024-03-11 MED ORDER — MIDAZOLAM HCL 2 MG/ML PO SYRP: 8.0000 mg | ORAL_SOLUTION | Freq: Once | ORAL | Status: DC | PRN

## 2024-03-11 MED ORDER — EPOETIN ALFA-EPBX 10000 UNIT/ML IJ SOLN
10000.0000 [IU] | INTRAMUSCULAR | Status: DC
  Administered 2024-03-11: 10000 [IU] via INTRAVENOUS
  Filled 2024-03-11: qty 2

## 2024-03-11 NOTE — H&P (View-Only) (Signed)
 Progress Note    03/11/2024 9:18 AM 4 Days Post-Op  Subjective:  Larry Callahan is a 68 yo now POD # 4 from:   PROCEDURE: Insertion of temporary dialysis catheter catheter left femoral approach.  Patient resting comfortably. No complaints over night. Vitals all remain stable. Patient will require long term hemodialysis. Vascular surgery now asked to place a perm cath.    Vitals:   03/11/24 0528 03/11/24 0757  BP: (!) 164/66 (!) 154/67  Pulse:  82  Resp:  18  Temp:  98.8 F (37.1 C)  SpO2:  98%   Physical Exam: Cardiac:  RRR, Normal S1, S2. No murmurs.  Lungs: Nonlabored breathing, generally clear on auscultation throughout with bilateral basilar rales.  Slight wheezing noted on deep inspiration.  No rhonchi Incisions: Temp catheter right groin puncture incision.  Clean dry and intact with catheter in place.  No infection noted. Extremities: Lower extremities warm to touch with palpable pulses. Abdomen: Positive bowel sounds throughout, soft, nontender nondistended. Neurologic: Alert and oriented x 3 answers questions and follows commands.  CBC    Component Value Date/Time   WBC 8.6 03/11/2024 0342   RBC 2.64 (L) 03/11/2024 0342   HGB 7.9 (L) 03/11/2024 0342   HGB 11.0 (L) 02/27/2018 0844   HCT 24.3 (L) 03/11/2024 0342   HCT 33.4 (L) 02/27/2018 0844   PLT 189 03/11/2024 0342   PLT 248 02/27/2018 0844   MCV 92.0 03/11/2024 0342   MCV 79 02/27/2018 0844   MCV 90 03/06/2013 0621   MCH 29.9 03/11/2024 0342   MCHC 32.5 03/11/2024 0342   RDW 14.5 03/11/2024 0342   RDW 15.6 (H) 02/27/2018 0844   RDW 13.9 03/06/2013 0621   LYMPHSABS 2.6 03/11/2024 0342   LYMPHSABS 2.2 02/27/2018 0844   LYMPHSABS 1.8 12/08/2012 0539   MONOABS 0.7 03/11/2024 0342   MONOABS 1.0 12/08/2012 0539   EOSABS 0.2 03/11/2024 0342   EOSABS 0.3 02/27/2018 0844   EOSABS 0.2 12/08/2012 0539   BASOSABS 0.1 03/11/2024 0342   BASOSABS 0.1 02/27/2018 0844   BASOSABS 0.1 12/08/2012 0539    BMET     Component Value Date/Time   NA 138 03/11/2024 0342   NA 136 07/15/2021 1151   NA 138 03/06/2013 0621   K 4.6 03/11/2024 0342   K 3.9 03/06/2013 0621   CL 105 03/11/2024 0342   CL 107 03/06/2013 0621   CO2 22 03/11/2024 0342   CO2 23 03/06/2013 0621   GLUCOSE 153 (H) 03/11/2024 0342   GLUCOSE 157 (H) 03/06/2013 0621   BUN 86 (H) 03/11/2024 0342   BUN 41 (H) 07/15/2021 1151   BUN 19 (H) 03/06/2013 0621   CREATININE 4.92 (H) 03/11/2024 0342   CREATININE 1.01 03/06/2013 0621   CALCIUM  8.4 (L) 03/11/2024 0342   CALCIUM  9.3 03/06/2013 0621   GFRNONAA 12 (L) 03/11/2024 0342   GFRNONAA >60 03/06/2013 0621   GFRAA 51 (L) 02/18/2020 1730   GFRAA >60 03/06/2013 0621    INR    Component Value Date/Time   INR 1.8 (H) 11/15/2023 1217   INR 0.8 09/04/2011 1627     Intake/Output Summary (Last 24 hours) at 03/11/2024 0918 Last data filed at 03/11/2024 0800 Gross per 24 hour  Intake 480 ml  Output 2526 ml  Net -2046 ml     Assessment/Plan:  67 y.o. male is s/p temporary dialysis catheter placement.  4 Days Post-Op   PLAN Nephrology team is deemed the patient will need long-term outpatient hemodialysis.  Today they ask us  for placement of dialysis permacatheter for out patient hemodialysis.  Vascular surgery plans on taking the patient to the vascular lab on Tuesday, 03/12/2024 for dialysis permacatheter placement.  I did discussion with the patient this morning concerning the procedure, benefits, risk, complications.  Patient verbalized understanding wishes to proceed.  Patient was made n.p.o. after midnight tonight for procedure tomorrow.  Patient is currently on Eliquis  5 mg twice daily will be held tonight and tomorrow morning for procedure.  Patient's current BUN is 86 and creatinine is 4.92.  We will check labs in the morning to assess the patient's BUN.  Patient's last hemoglobin is 7.9 and hematocrit is 24.3.   DVT prophylaxis:  Eliquis  5 mg BID   [name] 03/11/2024 9:18 AM

## 2024-03-11 NOTE — Progress Notes (Signed)
 Progress Note   Patient: Larry Callahan FMW:[id number] DOB: 18-May-1956 DOA: 03/04/2024     5 DOS: the patient was seen and examined on 03/11/2024     Brief hospital course: Larry Callahan is a 68 y.o. male with medical history significant of chronic HFrEF and HFpEF with improved LVEF from 40% to 50% recently, a flutter status post ablation on Eliquis , CKD stage IV HTN, IDDM, COPD, nonobstructive CAD, CVA with residual right-sided weakness, sent from nursing home for evaluation of new onset of right shoulder and right upper arm pain and shortness of breath. Admitted for acute on chronic heart failure.     Assessment and Plan:   Acute on chronic combined HFrEF and HFpEF decompensation - Significant fluid overload on physical exam and x-ray - Reason for decompensation is probably multifactorial from worsening of anemia as well as probably frequent breakthrough of A-fib with RVR. -Troponin 22 -> 23, likely demand - Echo 05/25 EF 50 to 55%, no RWMA.  Grade I diastolic dysfunction Continue torsemide orally - Daily weights, monitor I's/o   A-fib with RVR - s/p Digoxin  - Coreg  increased 12.5 mg bid - Continue Eliquis    Right shoulder pain/right upper arm pain - improved - Clinically suspect pinched nerve from underlying cervical spinal radiculopathy - XR C-spine: Moderate spondylosis of the cervical spine with minimal disc disease at the C3-C4 level - XR right shoulder: Mild degenerative changes of acromioclavicular joint - Outpatient follow up   ESRD on hemodialysis Metabolic acidosis - Volume overload related, clinically suspect cardiorenal syndrome - Cr 5.55 -> 5.38, Baseline Cr ~ 4.1 - Diuresis as above - Plan of care discussed with nephrologist - Nephrologist initiated hemodialysis in this hospitalization Awaiting permacath placement   Acute on chronic anemia secondary to CKD Symptomatic anemia - Patient refused PRBC and all other blood products - Check iron   study and reticulocyte count, will consider IV iron  versus EPO infusion if indicated.   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 - No new neurological deficits, continue Eliquis  and statin   DVT prophylaxis: Eliquis    Subjective:  Nephrology still having rounds of dialysis with patient He denies any acute overnight complaint Denied nausea vomiting abdominal pain chest pain cough   Physical Exam:   Constitutional: NAD, calm, comfortable  Neck: normal, supple, no masses, no thyromegaly Respiratory: clear to auscultation bilaterally, no wheezing, no crackles. Normal respiratory effort. No accessory muscle use.  Cardiovascular: RRR, no murmurs / rubs / gallops.  1+ extremity edema Abdomen: no tenderness, no masses palpated. No hepatosplenomegaly. Bowel sounds positive.  Musculoskeletal: Tenderness on right shoulder muscle and deltoid and decreased light touch sensation of right shoulder and right upper arm Skin: no rashes, lesions, ulcers. No induration Neurologic: AO x3, No gross focal deficits     Data Reviewed:   Vitals:   03/11/24 1530 03/11/24 1556 03/11/24 1600 03/11/24 1701  BP: 127/74 (!) 152/84 (!) 154/80 (!) 146/74  Pulse: 86 79 76 82  Resp: (!) 28 (!) 26 (!) 29 18  Temp:   97.6 F (36.4 C) (!) 97.5 F (36.4 C)  TempSrc:  Axillary  Oral  SpO2: 98% 100% 100% 99%  Weight:      Height:  Latest Ref Rng & Units 03/11/2024    3:42 AM 03/10/2024    4:38 AM 03/09/2024    3:41 AM  BMP  Glucose 70 - 99 mg/dL 846  880  894   BUN 8 - 23 mg/dL 86  67  86   Creatinine 0.61 - 1.24 mg/dL 5.07  5.98  5.53   Sodium 135 - 145 mmol/L 138  135  137   Potassium 3.5 - 5.1 mmol/L 4.6  4.4  4.6   Chloride 98 - 111 mmol/L 105  104  105    CO2 22 - 32 mmol/L 22  23  22    Calcium  8.9 - 10.3 mg/dL 8.4  7.8  8.0        Latest Ref Rng & Units 03/11/2024    3:42 AM 03/10/2024    4:38 AM 03/09/2024    3:41 AM  CBC  WBC 4.0 - 10.5 K/uL 8.6  8.0  7.4   Hemoglobin 13.0 - 17.0 g/dL 7.9  7.3  7.3   Hematocrit 39.0 - 52.0 % 24.3  22.9  22.8   Platelets 150 - 400 K/uL 189  183  185      Author: Drue ONEIDA Potter, MD 03/11/2024 5:35 PM  For on call review [email or web address].

## 2024-03-11 NOTE — Plan of Care (Signed)
  Problem: Coping: Goal: Ability to adjust to condition or change in health will improve Outcome: Progressing   Problem: Metabolic: Goal: Ability to maintain appropriate glucose levels will improve Outcome: Progressing   Problem: Nutritional: Goal: Maintenance of adequate nutrition will improve Outcome: Progressing   Problem: Tissue Perfusion: Goal: Adequacy of tissue perfusion will improve Outcome: Progressing   Problem: Clinical Measurements: Goal: Cardiovascular complication will be avoided Outcome: Progressing   Problem: Nutrition: Goal: Adequate nutrition will be maintained Outcome: Progressing   Problem: Elimination: Goal: Will not experience complications related to urinary retention Outcome: Progressing   Problem: Safety: Goal: Ability to remain free from injury will improve Outcome: Progressing

## 2024-03-11 NOTE — Progress Notes (Signed)
 Progress Note    03/11/2024 9:18 AM 4 Days Post-Op  Subjective:  Larry Callahan is a 68 yo now POD # 4 from:   PROCEDURE: Insertion of temporary dialysis catheter catheter left femoral approach.  Patient resting comfortably. No complaints over night. Vitals all remain stable. Patient will require long term hemodialysis. Vascular surgery now asked to place a perm cath.    Vitals:   03/11/24 0528 03/11/24 0757  BP: (!) 164/66 (!) 154/67  Pulse:  82  Resp:  18  Temp:  98.8 F (37.1 C)  SpO2:  98%   Physical Exam: Cardiac:  RRR, Normal S1, S2. No murmurs.  Lungs: Nonlabored breathing, generally clear on auscultation throughout with bilateral basilar rales.  Slight wheezing noted on deep inspiration.  No rhonchi Incisions: Temp catheter right groin puncture incision.  Clean dry and intact with catheter in place.  No infection noted. Extremities: Lower extremities warm to touch with palpable pulses. Abdomen: Positive bowel sounds throughout, soft, nontender nondistended. Neurologic: Alert and oriented x 3 answers questions and follows commands.  CBC    Component Value Date/Time   WBC 8.6 03/11/2024 0342   RBC 2.64 (L) 03/11/2024 0342   HGB 7.9 (L) 03/11/2024 0342   HGB 11.0 (L) 02/27/2018 0844   HCT 24.3 (L) 03/11/2024 0342   HCT 33.4 (L) 02/27/2018 0844   PLT 189 03/11/2024 0342   PLT 248 02/27/2018 0844   MCV 92.0 03/11/2024 0342   MCV 79 02/27/2018 0844   MCV 90 03/06/2013 0621   MCH 29.9 03/11/2024 0342   MCHC 32.5 03/11/2024 0342   RDW 14.5 03/11/2024 0342   RDW 15.6 (H) 02/27/2018 0844   RDW 13.9 03/06/2013 0621   LYMPHSABS 2.6 03/11/2024 0342   LYMPHSABS 2.2 02/27/2018 0844   LYMPHSABS 1.8 12/08/2012 0539   MONOABS 0.7 03/11/2024 0342   MONOABS 1.0 12/08/2012 0539   EOSABS 0.2 03/11/2024 0342   EOSABS 0.3 02/27/2018 0844   EOSABS 0.2 12/08/2012 0539   BASOSABS 0.1 03/11/2024 0342   BASOSABS 0.1 02/27/2018 0844   BASOSABS 0.1 12/08/2012 0539    BMET     Component Value Date/Time   NA 138 03/11/2024 0342   NA 136 07/15/2021 1151   NA 138 03/06/2013 0621   K 4.6 03/11/2024 0342   K 3.9 03/06/2013 0621   CL 105 03/11/2024 0342   CL 107 03/06/2013 0621   CO2 22 03/11/2024 0342   CO2 23 03/06/2013 0621   GLUCOSE 153 (H) 03/11/2024 0342   GLUCOSE 157 (H) 03/06/2013 0621   BUN 86 (H) 03/11/2024 0342   BUN 41 (H) 07/15/2021 1151   BUN 19 (H) 03/06/2013 0621   CREATININE 4.92 (H) 03/11/2024 0342   CREATININE 1.01 03/06/2013 0621   CALCIUM  8.4 (L) 03/11/2024 0342   CALCIUM  9.3 03/06/2013 0621   GFRNONAA 12 (L) 03/11/2024 0342   GFRNONAA >60 03/06/2013 0621   GFRAA 51 (L) 02/18/2020 1730   GFRAA >60 03/06/2013 0621    INR    Component Value Date/Time   INR 1.8 (H) 11/15/2023 1217   INR 0.8 09/04/2011 1627     Intake/Output Summary (Last 24 hours) at 03/11/2024 0918 Last data filed at 03/11/2024 0800 Gross per 24 hour  Intake 480 ml  Output 2526 ml  Net -2046 ml     Assessment/Plan:  67 y.o. male is s/p temporary dialysis catheter placement.  4 Days Post-Op   PLAN Nephrology team is deemed the patient will need long-term outpatient hemodialysis.  Today they ask us  for placement of dialysis permacatheter for out patient hemodialysis.  Vascular surgery plans on taking the patient to the vascular lab on Tuesday, 03/12/2024 for dialysis permacatheter placement.  I did discussion with the patient this morning concerning the procedure, benefits, risk, complications.  Patient verbalized understanding wishes to proceed.  Patient was made n.p.o. after midnight tonight for procedure tomorrow.  Patient is currently on Eliquis  5 mg twice daily will be held tonight and tomorrow morning for procedure.  Patient's current BUN is 86 and creatinine is 4.92.  We will check labs in the morning to assess the patient's BUN.  Patient's last hemoglobin is 7.9 and hematocrit is 24.3.   DVT prophylaxis:  Eliquis  5 mg BID   [name] 03/11/2024 9:18 AM

## 2024-03-11 NOTE — Progress Notes (Signed)
  Received patient in bed to unit.   Informed consent signed and in chart.    TX duration: 3hrs     Transported back to floor Hand-off given to patient's nurse. No c/o and no acute distress noted    Access used: L HD Femoral catheter  Access issues: none   Total UF removed: none Medication(s) given: retacrit  Post HD VS: wnl      Olivia Hurst LPN [facility]

## 2024-03-11 NOTE — Plan of Care (Signed)

## 2024-03-11 NOTE — Progress Notes (Signed)
 [facility]  ROUNDING NOTE   Subjective:   Larry Callahan is a 68 y.o. male with past medical conditions including CHF, COPD, CAD, stroke, diabetes, CKDIV and hypertension, who was admitted for CHF (congestive heart failure) (HCC) [I50.9] Tongue lesion [K14.8] Acute pain of right shoulder [M25.511]  Patient is known to our practice and is followed by Dr. Marcelino.  Patient was last seen in our office on 9/15 for routine follow-up.    Update  Patient seen sitting up in bed Eating breakfast States he was nauseous after dialysis on Monday Agreeable to treatment today  Objective:  Vital signs in last 24 hours:  Temp:  [97.5 F (36.4 C)-98.8 F (37.1 C)] 98.8 F (37.1 C) (09/29 0757) Pulse Rate:  [73-82] 82 (09/29 0757) Resp:  [17-18] 18 (09/29 0757) BP: (148-164)/(66-78) 154/67 (09/29 0757) SpO2:  [97 %-100 %] 98 % (09/29 0757) Weight:  [85.3 kg-86.1 kg] 86.1 kg (09/29 0528)  Weight change: -0.669 kg Filed Weights   03/10/24 0318 03/11/24 0527 03/11/24 0528  Weight: 83.9 kg 85.3 kg 86.1 kg    Intake/Output: I/O last 3 completed shifts: In: 480 [P.O.:480] Out: 2551 [Urine:2550; Stool:1]   Intake/Output this shift:  Total I/O In: 180 [P.O.:180] Out: 300 [Urine:300]  Physical Exam: General: NAD  Head: Normocephalic, atraumatic. Moist oral mucosal membranes  Eyes: Anicteric  Lungs:  Clear to auscultation, normal effort  Heart: Regular rate and rhythm  Abdomen:  Soft, nontender  Extremities: 2+ peripheral edema.  Neurologic: Awake, alert, conversant  Skin: Warm,dry, no rash  Access: None    Basic Metabolic Panel: Recent Labs  Lab 03/05/24 0353 03/06/24 0358 03/07/24 0351 03/08/24 0507 03/09/24 0341 03/10/24 0438 03/11/24 0342  NA 138 140 139 139 137 135 138  K 4.6 5.0 4.9 4.9 4.6 4.4 4.6  CL 109 108 107 106 105 104 105  CO2 18* 20* 20* 20* 22 23 22   GLUCOSE 113* 113* 118* 107* 105* 119* 153*  BUN 89* 96* 94* 105* 86* 67* 86*   CREATININE 5.38* 5.55* 5.98* 5.89* 4.46* 4.01* 4.92*  CALCIUM  8.2* 8.2* 8.4* 8.2* 8.0* 7.8* 8.4*  MG 1.7 1.7  --   --   --   --   --   PHOS 6.4*  --   --   --   --   --   --     Liver Function Tests: No results for input(s): AST, ALT, ALKPHOS, BILITOT, PROT, ALBUMIN  in the last 168 hours.  No results for input(s): LIPASE, AMYLASE in the last 168 hours.  No results for input(s): AMMONIA in the last 168 hours.  CBC: Recent Labs  Lab 03/07/24 0351 03/08/24 0507 03/09/24 0341 03/10/24 0438 03/11/24 0342  WBC 6.8 7.1 7.4 8.0 8.6  NEUTROABS 3.5 3.9 4.2 4.5 4.9  HGB 7.4* 7.5* 7.3* 7.3* 7.9*  HCT 23.1* 23.7* 22.8* 22.9* 24.3*  MCV 93.9 93.7 93.4 93.5 92.0  PLT 165 178 185 183 189    Cardiac Enzymes: No results for input(s): CKTOTAL, CKMB, CKMBINDEX, TROPONINI in the last 168 hours.  BNP: Invalid input(s): POCBNP  CBG: Recent Labs  Lab 03/10/24 0747 03/10/24 1148 03/10/24 1657 03/10/24 2139 03/11/24 0802  GLUCAP 113* 169* 155* 166* 127*    Microbiology: Results for orders placed or performed during the hospital encounter of 11/22/23  Resp panel by RT-PCR (RSV, Flu A&B, Covid) Anterior Nasal Swab     Status: None   Collection Time: 11/22/23  8:26 PM   Specimen: Anterior  Nasal Swab  Result Value Ref Range Status   SARS Coronavirus 2 by RT PCR NEGATIVE NEGATIVE Final    Comment: (NOTE) SARS-CoV-2 target nucleic acids are NOT DETECTED.  The SARS-CoV-2 RNA is generally detectable in upper respiratory specimens during the acute phase of infection. The lowest concentration of SARS-CoV-2 viral copies this assay can detect is 138 copies/mL. A negative result does not preclude SARS-Cov-2 infection and should not be used as the sole basis for treatment or other patient management decisions. A negative result may occur with  improper specimen collection/handling, submission of specimen other than nasopharyngeal swab, presence of viral  mutation(s) within the areas targeted by this assay, and inadequate number of viral copies(<138 copies/mL). A negative result must be combined with clinical observations, patient history, and epidemiological information. The expected result is Negative.  Fact Sheet for Patients:  BloggerCourse.com  Fact Sheet for Healthcare Providers:  SeriousBroker.it  This test is no t yet approved or cleared by the [place]  FDA and  has been authorized for detection and/or diagnosis of SARS-CoV-2 by FDA under an Emergency Use Authorization (EUA). This EUA will remain  in effect (meaning this test can be used) for the duration of the COVID-19 declaration under Section 564(b)(1) of the Act, 21 U.S.C.section 360bbb-3(b)(1), unless the authorization is terminated  or revoked sooner.       Influenza A by PCR NEGATIVE NEGATIVE Final   Influenza B by PCR NEGATIVE NEGATIVE Final    Comment: (NOTE) The Xpert Xpress SARS-CoV-2/FLU/RSV plus assay is intended as an aid in the diagnosis of influenza from Nasopharyngeal swab specimens and should not be used as a sole basis for treatment. Nasal washings and aspirates are unacceptable for Xpert Xpress SARS-CoV-2/FLU/RSV testing.  Fact Sheet for Patients: BloggerCourse.com  Fact Sheet for Healthcare Providers: SeriousBroker.it  This test is not yet approved or cleared by the [place]  FDA and has been authorized for detection and/or diagnosis of SARS-CoV-2 by FDA under an Emergency Use Authorization (EUA). This EUA will remain in effect (meaning this test can be used) for the duration of the COVID-19 declaration under Section 564(b)(1) of the Act, 21 U.S.C. section 360bbb-3(b)(1), unless the authorization is terminated or revoked.     Resp Syncytial Virus by PCR NEGATIVE NEGATIVE Final    Comment: (NOTE) Fact Sheet for  Patients: BloggerCourse.com  Fact Sheet for Healthcare Providers: SeriousBroker.it  This test is not yet approved or cleared by the [place]  FDA and has been authorized for detection and/or diagnosis of SARS-CoV-2 by FDA under an Emergency Use Authorization (EUA). This EUA will remain in effect (meaning this test can be used) for the duration of the COVID-19 declaration under Section 564(b)(1) of the Act, 21 U.S.C. section 360bbb-3(b)(1), unless the authorization is terminated or revoked.  Performed at [facility], [address] [place], [place] [address]     Coagulation Studies: No results for input(s): LABPROT, INR in the last 72 hours.  Urinalysis: No results for input(s): COLORURINE, LABSPEC, PHURINE, GLUCOSEU, HGBUR, BILIRUBINUR, KETONESUR, PROTEINUR, UROBILINOGEN, NITRITE, LEUKOCYTESUR in the last 72 hours.  Invalid input(s): APPERANCEUR    Imaging: No results found.     Medications:     apixaban   5 mg Oral BID   budesonide -glycopyrrolate -formoterol   2 puff Inhalation BID   carvedilol   6.25 mg Oral BID WC   Chlorhexidine  Gluconate Cloth  6 each Topical Q0600   hydrALAZINE   10 mg Oral Q8H   insulin  aspart  0-5 Units Subcutaneous QHS  insulin  aspart  0-9 Units Subcutaneous TID WC   isosorbide  mononitrate  30 mg Oral Daily   lidocaine   1 patch Transdermal Q24H   pantoprazole   40 mg Oral Daily   rosuvastatin   10 mg Oral QHS   torsemide  20 mg Oral Daily   acetaminophen , ipratropium, LORazepam , methocarbamol , metoprolol  tartrate, ondansetron  (ZOFRAN ) IV, mouth rinse  Assessment/ Plan:  Larry Callahan is a 68 y.o.  male  with past medical conditions including CHF, COPD, CAD, stroke, diabetes, CKDIV and hypertension.  Patient has been admitted for CHF (congestive heart failure) (HCC) [I50.9] Tongue lesion [K14.8] Acute pain of right shoulder  [M25.511]   End stage renal disease on hemodialysis  Due to multiple episodes of shortness of breath and increased volume status. Appreciate vascular surgery for placing HD temp cath  Will place a permcath this week.   Will receive dialysis later today Dialysis navigator in contact with [facility] for outpatient dialysis needs.    Lab Results  Component Value Date   CREATININE 4.92 (H) 03/11/2024   CREATININE 4.01 (H) 03/10/2024   CREATININE 4.46 (H) 03/09/2024    Intake/Output Summary (Last 24 hours) at 03/11/2024 1100 Last data filed at 03/11/2024 1017 Gross per 24 hour  Intake 660 ml  Output 2226 ml  Net -1566 ml    2.  Acute metabolic acidosis.  Serum bicarb 18 on ED arrival. Levels corrected with dialysis.   3.  Acute on chronic heart failure.  Most recent echo on 11/05/2023 shows EF 50 to 55% with grade 1 diastolic dysfunction and trivial MVR.  Chest x-ray shows pulmonary vascular congestion.  Transitioned to oral Torsemide.   4. Anemia of chronic kidney disease Lab Results  Component Value Date   HGB 7.9 (L) 03/11/2024    Hemoglobin decreased.  Will order Retacrit  10000 units with dialysis today.    LOS: 5 Trentan Trippe 9/29/202511:00 AM

## 2024-03-12 ENCOUNTER — Encounter: Payer: Self-pay | Admitting: Vascular Surgery

## 2024-03-12 ENCOUNTER — Encounter
Admission: EM | Disposition: A | Discharge status: Skilled Nursing Facility | Payer: Self-pay | Source: Skilled Nursing Facility | Attending: Internal Medicine

## 2024-03-12 ENCOUNTER — Other Ambulatory Visit: Payer: Self-pay

## 2024-03-12 DIAGNOSIS — Z992 Dependence on renal dialysis: Secondary | ICD-10-CM | POA: Diagnosis not present

## 2024-03-12 DIAGNOSIS — N186 End stage renal disease: Secondary | ICD-10-CM | POA: Diagnosis not present

## 2024-03-12 HISTORY — PX: DIALYSIS/PERMA CATHETER INSERTION: CATH118288

## 2024-03-12 LAB — CBC WITH DIFFERENTIAL/PLATELET
Abs Immature Granulocytes: 0.05 K/uL (ref 0.00–0.07)
Basophils Absolute: 0.1 K/uL (ref 0.0–0.1)
Basophils Relative: 1 %
Eosinophils Absolute: 0.1 K/uL (ref 0.0–0.5)
Eosinophils Relative: 2 %
HCT: 24.8 % — ABNORMAL LOW (ref 39.0–52.0)
Hemoglobin: 7.6 g/dL — ABNORMAL LOW (ref 13.0–17.0)
Immature Granulocytes: 1 %
Lymphocytes Relative: 24 %
Lymphs Abs: 1.7 K/uL (ref 0.7–4.0)
MCH: 29 pg (ref 26.0–34.0)
MCHC: 30.6 g/dL (ref 30.0–36.0)
MCV: 94.7 fL (ref 80.0–100.0)
Monocytes Absolute: 0.7 K/uL (ref 0.1–1.0)
Monocytes Relative: 9 %
Neutro Abs: 4.8 K/uL (ref 1.7–7.7)
Neutrophils Relative %: 63 %
Platelets: 174 K/uL (ref 150–400)
RBC: 2.62 MIL/uL — ABNORMAL LOW (ref 4.22–5.81)
RDW: 14.5 % (ref 11.5–15.5)
WBC: 7.4 K/uL (ref 4.0–10.5)
nRBC: 0 % (ref 0.0–0.2)

## 2024-03-12 LAB — BASIC METABOLIC PANEL WITH GFR
Anion gap: 10 (ref 5–15)
BUN: 49 mg/dL — ABNORMAL HIGH (ref 8–23)
CO2: 26 mmol/L (ref 22–32)
Calcium: 8.5 mg/dL — ABNORMAL LOW (ref 8.9–10.3)
Chloride: 102 mmol/L (ref 98–111)
Creatinine, Ser: 3.15 mg/dL — ABNORMAL HIGH (ref 0.61–1.24)
GFR, Estimated: 20 mL/min — ABNORMAL LOW (ref >60)
Glucose, Bld: 120 mg/dL — ABNORMAL HIGH (ref 70–99)
Potassium: 3.9 mmol/L (ref 3.5–5.1)
Sodium: 138 mmol/L (ref 135–145)

## 2024-03-12 LAB — GLUCOSE, CAPILLARY
Glucose-Capillary: 115 mg/dL — ABNORMAL HIGH (ref 70–99)
Glucose-Capillary: 114 mg/dL — ABNORMAL HIGH (ref 70–99)
Glucose-Capillary: 238 mg/dL — ABNORMAL HIGH (ref 70–99)

## 2024-03-12 SURGERY — DIALYSIS/PERMA CATHETER INSERTION
Anesthesia: Moderate Sedation

## 2024-03-12 MED ORDER — FENTANYL CITRATE PF 50 MCG/ML IJ SOSY
PREFILLED_SYRINGE | INTRAMUSCULAR | Status: AC
  Filled 2024-03-12: qty 1

## 2024-03-12 MED ORDER — MIDAZOLAM HCL 2 MG/2ML IJ SOLN
INTRAMUSCULAR | Status: DC | PRN
  Administered 2024-03-12: 2 mg via INTRAVENOUS

## 2024-03-12 MED ORDER — HEPARIN SODIUM (PORCINE) 10000 UNIT/ML IJ SOLN
INTRAMUSCULAR | Status: DC | PRN
  Administered 2024-03-12: 10000 [IU]

## 2024-03-12 MED ORDER — HYDRALAZINE HCL 10 MG PO TABS: 10.0000 mg | ORAL_TABLET | Freq: Three times a day (TID) | ORAL | Status: DC

## 2024-03-12 MED ORDER — HEPARIN (PORCINE) IN NACL 1000-0.9 UT/500ML-% IV SOLN
INTRAVENOUS | Status: DC | PRN
  Administered 2024-03-12: 500 mL

## 2024-03-12 MED ORDER — LIDOCAINE-EPINEPHRINE (PF) 1 %-1:200000 IJ SOLN
INTRAMUSCULAR | Status: DC | PRN
  Administered 2024-03-12: 20 mL

## 2024-03-12 MED ORDER — CARVEDILOL 3.125 MG PO TABS: 6.2500 mg | ORAL_TABLET | Freq: Two times a day (BID) | ORAL | Status: AC

## 2024-03-12 MED ORDER — ISOSORBIDE MONONITRATE ER 30 MG PO TB24: 30.0000 mg | ORAL_TABLET | Freq: Every day | ORAL | Status: AC

## 2024-03-12 MED ORDER — FENTANYL CITRATE (PF) 100 MCG/2ML IJ SOLN
INTRAMUSCULAR | Status: DC | PRN
  Administered 2024-03-12: 50 ug via INTRAVENOUS

## 2024-03-12 MED ORDER — HEPARIN SODIUM (PORCINE) 10000 UNIT/ML IJ SOLN
INTRAMUSCULAR | Status: AC
  Filled 2024-03-12: qty 1

## 2024-03-12 MED ORDER — INSULIN ASPART 100 UNIT/ML IJ SOLN: 0.0000 [IU] | Freq: Three times a day (TID) | INTRAMUSCULAR | 0 refills | Status: AC

## 2024-03-12 MED ORDER — MIDAZOLAM HCL 2 MG/2ML IJ SOLN
INTRAMUSCULAR | Status: AC
  Filled 2024-03-12: qty 2

## 2024-03-12 MED ORDER — TORSEMIDE 20 MG PO TABS: 20.0000 mg | ORAL_TABLET | Freq: Every day | ORAL | Status: AC

## 2024-03-12 SURGICAL SUPPLY — 13 items
BIOPATCH RED 1 DISK 7.0 (GAUZE/BANDAGES/DRESSINGS) IMPLANT
CATH HEMO 15FR 19 SMART SEAL (HEMODIALYSIS SUPPLIES) IMPLANT
COVER PROBE ULTRASOUND 5X96 (MISCELLANEOUS) IMPLANT
DERMABOND ADVANCED .7 DNX12 (GAUZE/BANDAGES/DRESSINGS) IMPLANT
DRAPE INCISE 23X17 STRL (DRAPES) IMPLANT
GOWN STRL REUS W/ TWL LRG LVL3 (GOWN DISPOSABLE) ×1 IMPLANT
NDL ENTRY 21GA 7CM ECHOTIP (NEEDLE) IMPLANT
NEEDLE ENTRY 21GA 7CM ECHOTIP (NEEDLE) ×1 IMPLANT
PACK ANGIOGRAPHY (CUSTOM PROCEDURE TRAY) ×1 IMPLANT
SET INTRO CAPELLA COAXIAL (SET/KITS/TRAYS/PACK) IMPLANT
SUT MNCRL AB 4-0 PS2 18 (SUTURE) IMPLANT
SUT PROLENE 0 CT 1 30 (SUTURE) IMPLANT
SUT SILK 0 FSL (SUTURE) IMPLANT

## 2024-03-12 NOTE — TOC Progression Note (Signed)
 Transition of Care [facility]) - Progression Note    Patient Details  Name: Larry Callahan MRN: [id number] Date of Birth: 12-25-1955  [facility] CM/SW Contact  Lauraine JAYSON Carpen, LCSW Phone Number: 03/12/2024, 12:30 PM  Clinical Narrative:  [facility] admissions coordinator confirmed everything is ready for patient to return in regards to dialysis. They can accept him back today if stable but will need discharge summary before 4:30. No cutoff time for him arriving there. Sent secure chat to MD, nephrology NP, RN, and HD coordinator to notify.   Expected Discharge Plan: Skilled Nursing Facility Barriers to Discharge: Continued Medical Work up               Expected Discharge Plan and Services     Post Acute Care Choice: Resumption of Svcs/PTA Provider Living arrangements for the past 2 months: Skilled Nursing Facility                                       Social Drivers of Health (SDOH) Interventions SDOH Screenings   Food Insecurity: No Food Insecurity (03/04/2024)  Housing: Low Risk  (03/04/2024)  Transportation Needs: No Transportation Needs (03/04/2024)  Utilities: Not At Risk (03/04/2024)  Alcohol Screen: Low Risk  (03/29/2022)  Depression (PHQ2-9): Low Risk  (09/13/2022)  Financial Resource Strain: Low Risk  (11/16/2023)  Social Connections: Unknown (11/23/2023)  Tobacco Use: High Risk (03/05/2024)    Readmission Risk Interventions    12/01/2021   12:25 PM 08/07/2021    2:36 PM 06/28/2021    2:26 PM  Readmission Risk Prevention Plan  Transportation Screening Complete Complete Complete  Medication Review Oceanographer) Complete Complete Complete  PCP or Specialist appointment within 3-5 days of discharge Complete Complete Complete  HRI or Home Care Consult Complete Complete Complete  SW Recovery Care/Counseling Consult Complete Complete Complete  Palliative Care Screening Complete Complete Not Applicable  Skilled Nursing Facility  Complete Complete Not Applicable

## 2024-03-12 NOTE — TOC Transition Note (Signed)
 Transition of Care [facility]) - Discharge Note   Patient Details  Name: Larry Callahan MRN: [id number] Date of Birth: 16-Nov-1955  Transition of Care [facility]) CM/SW Contact:  Lauraine JAYSON Carpen, LCSW Phone Number: 03/12/2024, 3:59 PM   Clinical Narrative:  Patient has orders to discharge back to [facility] SN[facility]. RN has already called report. [facility] has been arranged and he is 7th on the list. No further concerns. CSW signing off.   Final next level of care: Skilled Nursing Facility Barriers to Discharge: Barriers Resolved   Patient Goals and CMS Choice            Discharge Placement   Existing PASRR number confirmed : 03/05/24          Patient chooses bed at: Other - please specify in the comment section below: ([facility]) Patient to be transferred to facility by: [facility] Name of family member notified: Phone number for nephew does not appear to be correct. SNF does not have a number for him. Patient and family notified of of transfer: 03/12/24  Discharge Plan and Services Additional resources added to the After Visit Summary for       Post Acute Care Choice: Resumption of Svcs/PTA Provider                               Social Drivers of Health (SDOH) Interventions SDOH Screenings   Food Insecurity: No Food Insecurity (03/04/2024)  Housing: Low Risk  (03/04/2024)  Transportation Needs: No Transportation Needs (03/04/2024)  Utilities: Not At Risk (03/04/2024)  Alcohol Screen: Low Risk  (03/29/2022)  Depression (PHQ2-9): Low Risk  (09/13/2022)  Financial Resource Strain: Low Risk  (11/16/2023)  Social Connections: Unknown (11/23/2023)  Tobacco Use: High Risk (03/05/2024)     Readmission Risk Interventions    12/01/2021   12:25 PM 08/07/2021    2:36 PM 06/28/2021    2:26 PM  Readmission Risk Prevention Plan  Transportation Screening Complete Complete Complete  Medication Review Oceanographer) Complete  Complete Complete  PCP or Specialist appointment within 3-5 days of discharge Complete Complete Complete  HRI or Home Care Consult Complete Complete Complete  SW Recovery Care/Counseling Consult Complete Complete Complete  Palliative Care Screening Complete Complete Not Applicable  Skilled Nursing Facility Complete Complete Not Applicable

## 2024-03-12 NOTE — Plan of Care (Signed)

## 2024-03-12 NOTE — Progress Notes (Signed)
 [facility]  ROUNDING NOTE   Subjective:   Larry Callahan is a 68 y.o. male with past medical conditions including CHF, COPD, CAD, stroke, diabetes, CKDIV and hypertension, who was admitted for CHF (congestive heart failure) (HCC) [I50.9] Tongue lesion [K14.8] Acute pain of right shoulder [M25.511]  Patient is known to our practice and is followed by Dr. Marcelino.  Patient was last seen in our office on 9/15 for routine follow-up.    Update  Patient sitting up in bed Alert Npo for vascular surgery   Objective:  Vital signs in last 24 hours:  Temp:  [97.5 F (36.4 C)-99.5 F (37.5 C)] 97.8 F (36.6 C) (09/30 1100) Pulse Rate:  [71-95] 71 (09/30 1200) Resp:  [10-29] 16 (09/30 1215) BP: (93-164)/(56-102) 119/70 (09/30 1230) SpO2:  [95 %-100 %] 97 % (09/30 1230) Weight:  [85.6 kg] 85.6 kg (09/30 0503)  Weight change: 0.769 kg Filed Weights   03/11/24 0528 03/11/24 1234 03/12/24 0503  Weight: 86.1 kg 86.1 kg 85.6 kg    Intake/Output: I/O last 3 completed shifts: In: 910.9 [P.O.:360; I.V.:550.9] Out: 3276 [Urine:3275; Stool:1]   Intake/Output this shift:  Total I/O In: 525.2 [I.V.:325.2; IV Piggyback:200] Out: 650 [Urine:650]  Physical Exam: General: NAD  Head: Normocephalic, atraumatic. Moist oral mucosal membranes  Eyes: Anicteric  Lungs:  Clear to auscultation, normal effort  Heart: Regular rate and rhythm  Abdomen:  Soft, nontender  Extremities: 1+ peripheral edema.  Neurologic: Awake, alert, conversant  Skin: Warm,dry, no rash  Access: HD temp cath    Basic Metabolic Panel: Recent Labs  Lab 03/06/24 0358 03/07/24 0351 03/08/24 0507 03/09/24 0341 03/10/24 0438 03/11/24 0342 03/12/24 0523  NA 140   < > 139 137 135 138 138  K 5.0   < > 4.9 4.6 4.4 4.6 3.9  CL 108   < > 106 105 104 105 102  CO2 20*   < > 20* 22 23 22 26   GLUCOSE 113*   < > 107* 105* 119* 153* 120*  BUN 96*   < > 105* 86* 67* 86* 49*  CREATININE 5.55*   < >  5.89* 4.46* 4.01* 4.92* 3.15*  CALCIUM  8.2*   < > 8.2* 8.0* 7.8* 8.4* 8.5*  MG 1.7  --   --   --   --   --   --    < > = values in this interval not displayed.    Liver Function Tests: No results for input(s): AST, ALT, ALKPHOS, BILITOT, PROT, ALBUMIN  in the last 168 hours.  No results for input(s): LIPASE, AMYLASE in the last 168 hours.  No results for input(s): AMMONIA in the last 168 hours.  CBC: Recent Labs  Lab 03/08/24 0507 03/09/24 0341 03/10/24 0438 03/11/24 0342 03/12/24 0523  WBC 7.1 7.4 8.0 8.6 7.4  NEUTROABS 3.9 4.2 4.5 4.9 4.8  HGB 7.5* 7.3* 7.3* 7.9* 7.6*  HCT 23.7* 22.8* 22.9* 24.3* 24.8*  MCV 93.7 93.4 93.5 92.0 94.7  PLT 178 185 183 189 174    Cardiac Enzymes: No results for input(s): CKTOTAL, CKMB, CKMBINDEX, TROPONINI in the last 168 hours.  BNP: Invalid input(s): POCBNP  CBG: Recent Labs  Lab 03/11/24 1131 03/11/24 1702 03/11/24 2045 03/12/24 0750 03/12/24 1051  GLUCAP 133* 146* 215* 115* 114*    Microbiology: Results for orders placed or performed during the hospital encounter of 11/22/23  Resp panel by RT-PCR (RSV, Flu A&B, Covid) Anterior Nasal Swab     Status: None  Collection Time: 11/22/23  8:26 PM   Specimen: Anterior Nasal Swab  Result Value Ref Range Status   SARS Coronavirus 2 by RT PCR NEGATIVE NEGATIVE Final    Comment: (NOTE) SARS-CoV-2 target nucleic acids are NOT DETECTED.  The SARS-CoV-2 RNA is generally detectable in upper respiratory specimens during the acute phase of infection. The lowest concentration of SARS-CoV-2 viral copies this assay can detect is 138 copies/mL. A negative result does not preclude SARS-Cov-2 infection and should not be used as the sole basis for treatment or other patient management decisions. A negative result may occur with  improper specimen collection/handling, submission of specimen other than nasopharyngeal swab, presence of viral mutation(s) within  the areas targeted by this assay, and inadequate number of viral copies(<138 copies/mL). A negative result must be combined with clinical observations, patient history, and epidemiological information. The expected result is Negative.  Fact Sheet for Patients:  BloggerCourse.com  Fact Sheet for Healthcare Providers:  SeriousBroker.it  This test is no t yet approved or cleared by the [place]  FDA and  has been authorized for detection and/or diagnosis of SARS-CoV-2 by FDA under an Emergency Use Authorization (EUA). This EUA will remain  in effect (meaning this test can be used) for the duration of the COVID-19 declaration under Section 564(b)(1) of the Act, 21 U.S.C.section 360bbb-3(b)(1), unless the authorization is terminated  or revoked sooner.       Influenza A by PCR NEGATIVE NEGATIVE Final   Influenza B by PCR NEGATIVE NEGATIVE Final    Comment: (NOTE) The Xpert Xpress SARS-CoV-2/FLU/RSV plus assay is intended as an aid in the diagnosis of influenza from Nasopharyngeal swab specimens and should not be used as a sole basis for treatment. Nasal washings and aspirates are unacceptable for Xpert Xpress SARS-CoV-2/FLU/RSV testing.  Fact Sheet for Patients: BloggerCourse.com  Fact Sheet for Healthcare Providers: SeriousBroker.it  This test is not yet approved or cleared by the [place]  FDA and has been authorized for detection and/or diagnosis of SARS-CoV-2 by FDA under an Emergency Use Authorization (EUA). This EUA will remain in effect (meaning this test can be used) for the duration of the COVID-19 declaration under Section 564(b)(1) of the Act, 21 U.S.C. section 360bbb-3(b)(1), unless the authorization is terminated or revoked.     Resp Syncytial Virus by PCR NEGATIVE NEGATIVE Final    Comment: (NOTE) Fact Sheet for  Patients: BloggerCourse.com  Fact Sheet for Healthcare Providers: SeriousBroker.it  This test is not yet approved or cleared by the [place]  FDA and has been authorized for detection and/or diagnosis of SARS-CoV-2 by FDA under an Emergency Use Authorization (EUA). This EUA will remain in effect (meaning this test can be used) for the duration of the COVID-19 declaration under Section 564(b)(1) of the Act, 21 U.S.C. section 360bbb-3(b)(1), unless the authorization is terminated or revoked.  Performed at [facility], [address] [place], [place] [address]     Coagulation Studies: No results for input(s): LABPROT, INR in the last 72 hours.  Urinalysis: No results for input(s): COLORURINE, LABSPEC, PHURINE, GLUCOSEU, HGBUR, BILIRUBINUR, KETONESUR, PROTEINUR, UROBILINOGEN, NITRITE, LEUKOCYTESUR in the last 72 hours.  Invalid input(s): APPERANCEUR    Imaging: PERIPHERAL VASCULAR CATHETERIZATION Result Date: 03/12/2024 See surgical note for result.      Medications:    sodium chloride  Stopped (03/12/24 1032)    [MAR Hold] apixaban   5 mg Oral BID   [MAR Hold] budesonide -glycopyrrolate -formoterol   2 puff Inhalation BID   [MAR Hold] carvedilol   6.25 mg  Oral BID WC   [MAR Hold] Chlorhexidine  Gluconate Cloth  6 each Topical Q0600   [MAR Hold] epoetin  alfa-epbx (RETACRIT ) injection  10,000 Units Intravenous Q M,W,F-1800   [MAR Hold] hydrALAZINE   10 mg Oral Q8H   [MAR Hold] insulin  aspart  0-5 Units Subcutaneous QHS   [MAR Hold] insulin  aspart  0-9 Units Subcutaneous TID WC   [MAR Hold] isosorbide  mononitrate  30 mg Oral Daily   [MAR Hold] lidocaine   1 patch Transdermal Q24H   [MAR Hold] pantoprazole   40 mg Oral Daily   [MAR Hold] rosuvastatin   10 mg Oral QHS   [MAR Hold] torsemide  20 mg Oral Daily   [MAR Hold] acetaminophen , diphenhydrAMINE , famotidine , fentaNYL , Heparin   (Porcine) in NaCl, heparin , [MAR Hold] ipratropium, lidocaine -EPINEPHrine  (PF), [MAR Hold] LORazepam , [MAR Hold] methocarbamol , methylPREDNISolone  (SOLU-MEDROL ) injection, [MAR Hold] metoprolol  tartrate, midazolam , midazolam , [MAR Hold] ondansetron  (ZOFRAN ) IV, [MAR Hold] mouth rinse  Assessment/ Plan:  [name] is a 68 y.o.  male  with past medical conditions including CHF, COPD, CAD, stroke, diabetes, CKDIV and hypertension.  Patient has been admitted for CHF (congestive heart failure) (HCC) [I50.9] Tongue lesion [K14.8] Acute pain of right shoulder [M25.511]   End stage renal disease on hemodialysis  Due to multiple episodes of shortness of breath and increased volume status. Appreciate vascular surgery for placing HD temp cath   Scheduled to receive permcath later today with vascular. Will plan to remove HD temp cath after placement.  If stable, patient cleared to discharge after pemcath placement.  Long term patient at [facility], has also accepted patient for dialysis    Lab Results  Component Value Date   CREATININE 3.15 (H) 03/12/2024   CREATININE 4.92 (H) 03/11/2024   CREATININE 4.01 (H) 03/10/2024    Intake/Output Summary (Last 24 hours) at 03/12/2024 1246 Last data filed at 03/12/2024 1218 Gross per 24 hour  Intake 1256.08 ml  Output 2000 ml  Net -743.92 ml    2.  Acute metabolic acidosis.  Serum bicarb 18 on ED arrival. Levels corrected with dialysis.   3.  Acute on chronic heart failure.  Most recent echo on 11/05/2023 shows EF 50 to 55% with grade 1 diastolic dysfunction and trivial MVR.  Chest x-ray shows pulmonary vascular congestion.  Continue Torsemide.   4. Anemia of chronic kidney disease Lab Results  Component Value Date   HGB 7.6 (L) 03/12/2024    Hemoglobin decreased.  Will need ESA outpatient    LOS: 6 Gerline Ratto 9/30/202512:46 PM

## 2024-03-12 NOTE — Progress Notes (Signed)
 Patient being discharged to [facility].  Discharge instructions and prescription placed in the packet for discharge.

## 2024-03-12 NOTE — Plan of Care (Signed)
  Problem: Education: Goal: Ability to describe self-care measures that may prevent or decrease complications (Diabetes Survival Skills Education) will improve Outcome: Adequate for Discharge Goal: Individualized Educational Video(s) Outcome: Adequate for Discharge   Problem: Coping: Goal: Ability to adjust to condition or change in health will improve Outcome: Adequate for Discharge   Problem: Fluid Volume: Goal: Ability to maintain a balanced intake and output will improve Outcome: Adequate for Discharge   Problem: Health Behavior/Discharge Planning: Goal: Ability to identify and utilize available resources and services will improve Outcome: Adequate for Discharge Goal: Ability to manage health-related needs will improve Outcome: Adequate for Discharge   Problem: Metabolic: Goal: Ability to maintain appropriate glucose levels will improve 03/12/2024 1512 by Les Delon CROME, RN Outcome: Adequate for Discharge 03/12/2024 0930 by Les Delon CROME, RN Outcome: Progressing   Problem: Nutritional: Goal: Maintenance of adequate nutrition will improve Outcome: Adequate for Discharge Goal: Progress toward achieving an optimal weight will improve Outcome: Adequate for Discharge   Problem: Skin Integrity: Goal: Risk for impaired skin integrity will decrease Outcome: Adequate for Discharge   Problem: Tissue Perfusion: Goal: Adequacy of tissue perfusion will improve Outcome: Adequate for Discharge   Problem: Education: Goal: Knowledge of General Education information will improve Description: Including pain rating scale, medication(s)/side effects and non-pharmacologic comfort measures Outcome: Adequate for Discharge   Problem: Health Behavior/Discharge Planning: Goal: Ability to manage health-related needs will improve Outcome: Adequate for Discharge   Problem: Clinical Measurements: Goal: Ability to maintain clinical measurements within normal limits will  improve Outcome: Adequate for Discharge Goal: Will remain free from infection Outcome: Adequate for Discharge Goal: Diagnostic test results will improve Outcome: Adequate for Discharge Goal: Respiratory complications will improve 03/12/2024 1512 by Les Delon CROME, RN Outcome: Adequate for Discharge 03/12/2024 0930 by Les Delon CROME, RN Outcome: Progressing Goal: Cardiovascular complication will be avoided Outcome: Adequate for Discharge   Problem: Activity: Goal: Risk for activity intolerance will decrease Outcome: Adequate for Discharge   Problem: Nutrition: Goal: Adequate nutrition will be maintained Outcome: Adequate for Discharge   Problem: Coping: Goal: Level of anxiety will decrease Outcome: Adequate for Discharge   Problem: Elimination: Goal: Will not experience complications related to bowel motility Outcome: Adequate for Discharge Goal: Will not experience complications related to urinary retention Outcome: Adequate for Discharge   Problem: Pain Managment: Goal: General experience of comfort will improve and/or be controlled Outcome: Adequate for Discharge   Problem: Safety: Goal: Ability to remain free from injury will improve Outcome: Adequate for Discharge   Problem: Skin Integrity: Goal: Risk for impaired skin integrity will decrease Outcome: Adequate for Discharge

## 2024-03-12 NOTE — Progress Notes (Signed)
 Report  called to [facility].  Iv removed with the catheter intact.

## 2024-03-12 NOTE — Plan of Care (Signed)
 Problem: Education: Goal: Ability to describe self-care measures that may prevent or decrease complications (Diabetes Survival Skills Education) will improve 03/12/2024 1513 by Les Delon CROME, RN Outcome: Adequate for Discharge 03/12/2024 1512 by Les Delon CROME, RN Outcome: Adequate for Discharge Goal: Individualized Educational Video(s) 03/12/2024 1513 by Les Delon CROME, RN Outcome: Adequate for Discharge 03/12/2024 1512 by Les Delon CROME, RN Outcome: Adequate for Discharge   Problem: Coping: Goal: Ability to adjust to condition or change in health will improve 03/12/2024 1513 by Les Delon CROME, RN Outcome: Adequate for Discharge 03/12/2024 1512 by Les Delon CROME, RN Outcome: Adequate for Discharge   Problem: Fluid Volume: Goal: Ability to maintain a balanced intake and output will improve 03/12/2024 1513 by Les Delon CROME, RN Outcome: Adequate for Discharge 03/12/2024 1512 by Les Delon CROME, RN Outcome: Adequate for Discharge   Problem: Health Behavior/Discharge Planning: Goal: Ability to identify and utilize available resources and services will improve 03/12/2024 1513 by Les Delon CROME, RN Outcome: Adequate for Discharge 03/12/2024 1512 by Les Delon CROME, RN Outcome: Adequate for Discharge Goal: Ability to manage health-related needs will improve 03/12/2024 1513 by Les Delon CROME, RN Outcome: Adequate for Discharge 03/12/2024 1512 by Les Delon CROME, RN Outcome: Adequate for Discharge   Problem: Metabolic: Goal: Ability to maintain appropriate glucose levels will improve 03/12/2024 1513 by Les Delon CROME, RN Outcome: Adequate for Discharge 03/12/2024 1512 by Les Delon CROME, RN Outcome: Adequate for Discharge 03/12/2024 0930 by Les Delon CROME, RN Outcome: Progressing   Problem: Nutritional: Goal: Maintenance of adequate nutrition will improve 03/12/2024 1513 by  Les Delon CROME, RN Outcome: Adequate for Discharge 03/12/2024 1512 by Les Delon CROME, RN Outcome: Adequate for Discharge Goal: Progress toward achieving an optimal weight will improve 03/12/2024 1513 by Les Delon CROME, RN Outcome: Adequate for Discharge 03/12/2024 1512 by Les Delon CROME, RN Outcome: Adequate for Discharge   Problem: Skin Integrity: Goal: Risk for impaired skin integrity will decrease 03/12/2024 1513 by Les Delon CROME, RN Outcome: Adequate for Discharge 03/12/2024 1512 by Les Delon CROME, RN Outcome: Adequate for Discharge   Problem: Tissue Perfusion: Goal: Adequacy of tissue perfusion will improve 03/12/2024 1513 by Les Delon CROME, RN Outcome: Adequate for Discharge 03/12/2024 1512 by Les Delon CROME, RN Outcome: Adequate for Discharge   Problem: Education: Goal: Knowledge of General Education information will improve Description: Including pain rating scale, medication(s)/side effects and non-pharmacologic comfort measures 03/12/2024 1513 by Les Delon CROME, RN Outcome: Adequate for Discharge 03/12/2024 1512 by Les Delon CROME, RN Outcome: Adequate for Discharge   Problem: Health Behavior/Discharge Planning: Goal: Ability to manage health-related needs will improve 03/12/2024 1513 by Les Delon CROME, RN Outcome: Adequate for Discharge 03/12/2024 1512 by Les Delon CROME, RN Outcome: Adequate for Discharge   Problem: Clinical Measurements: Goal: Ability to maintain clinical measurements within normal limits will improve 03/12/2024 1513 by Les Delon CROME, RN Outcome: Adequate for Discharge 03/12/2024 1512 by Les Delon CROME, RN Outcome: Adequate for Discharge Goal: Will remain free from infection 03/12/2024 1513 by Les Delon CROME, RN Outcome: Adequate for Discharge 03/12/2024 1512 by Les Delon CROME, RN Outcome: Adequate for Discharge Goal:  Diagnostic test results will improve 03/12/2024 1513 by Les Delon CROME, RN Outcome: Adequate for Discharge 03/12/2024 1512 by Les Delon CROME, RN Outcome: Adequate for Discharge Goal: Respiratory complications will improve 03/12/2024 1513 by Les Delon CROME, RN Outcome: Adequate for Discharge 03/12/2024 1512 by Les Delon CROME, RN Outcome: Adequate for Discharge 03/12/2024 0930 by Les Delon  L, RN Outcome: Progressing Goal: Cardiovascular complication will be avoided 03/12/2024 1513 by Les Delon CROME, RN Outcome: Adequate for Discharge 03/12/2024 1512 by Les Delon CROME, RN Outcome: Adequate for Discharge   Problem: Activity: Goal: Risk for activity intolerance will decrease 03/12/2024 1513 by Les Delon CROME, RN Outcome: Adequate for Discharge 03/12/2024 1512 by Les Delon CROME, RN Outcome: Adequate for Discharge   Problem: Nutrition: Goal: Adequate nutrition will be maintained 03/12/2024 1513 by Les Delon CROME, RN Outcome: Adequate for Discharge 03/12/2024 1512 by Les Delon CROME, RN Outcome: Adequate for Discharge   Problem: Coping: Goal: Level of anxiety will decrease 03/12/2024 1513 by Les Delon CROME, RN Outcome: Adequate for Discharge 03/12/2024 1512 by Les Delon CROME, RN Outcome: Adequate for Discharge   Problem: Pain Managment: Goal: General experience of comfort will improve and/or be controlled 03/12/2024 1513 by Les Delon CROME, RN Outcome: Adequate for Discharge 03/12/2024 1512 by Les Delon CROME, RN Outcome: Adequate for Discharge   Problem: Safety: Goal: Ability to remain free from injury will improve 03/12/2024 1513 by Les Delon CROME, RN Outcome: Adequate for Discharge 03/12/2024 1512 by Les Delon CROME, RN Outcome: Adequate for Discharge   Problem: Skin Integrity: Goal: Risk for impaired skin integrity will decrease 03/12/2024 1513 by  Les Delon CROME, RN Outcome: Adequate for Discharge 03/12/2024 1512 by Les Delon CROME, RN Outcome: Adequate for Discharge

## 2024-03-12 NOTE — Discharge Summary (Signed)
 Physician Discharge Summary   Patient: Larry Callahan MRN: [id number] DOB: 17-Oct-1955  Admit date:     03/04/2024  Discharge date: 03/12/24  Discharge Physician: Drue ONEIDA Potter   PCP: [facility]   Recommendations at discharge:  Follow-up with nephrology  Discharge Diagnoses: Principal Problem:   CHF (congestive heart failure) (HCC) Active Problems:   Acute on chronic HFrEF (heart failure with reduced ejection fraction) (HCC)   Atrial flutter (HCC)   Encounter for dialysis and dialysis catheter care   ESRD (end stage renal disease) (HCC)  Resolved Problems:   * No resolved hospital problems. *  Hospital Course: Larry Callahan is a 68 y.o. male with medical history significant of chronic HFrEF and HFpEF with improved LVEF from 40% to 50% recently, a flutter status post ablation on Eliquis , CKD stage IV HTN, IDDM, COPD, nonobstructive CAD, CVA with residual right-sided weakness, sent from nursing home for evaluation of new onset of right shoulder and right upper arm pain and shortness of breath. Admitted for acute on chronic heart failure.     Assessment and Plan:   Acute on chronic combined HFrEF and HFpEF decompensation - Significant fluid overload on physical exam and x-ray - Reason for decompensation is probably multifactorial from worsening of anemia as well as probably frequent breakthrough of A-fib with RVR. -Troponin 22 -> 23, likely demand - Echo 05/25 EF 50 to 55%, no RWMA.  Grade I diastolic dysfunction Continue torsemide orally Monitor input and output   A-fib with RVR - s/p Digoxin  - Coreg  increased 12.5 mg bid - Continue Eliquis    Right shoulder pain/right upper arm pain - improved - Clinically suspect pinched nerve from underlying cervical spinal radiculopathy - XR C-spine: Moderate spondylosis of the cervical spine with minimal disc disease at the C3-C4 level - XR right shoulder: Mild degenerative changes of acromioclavicular  joint - Outpatient follow up   ESRD on hemodialysis Metabolic acidosis Volume overload improved - Cr 5.55 -> 5.38, Baseline Cr ~ 4.1 - Plan of care discussed with nephrologist - Nephrologist initiated hemodialysis in this hospitalization Permacath was placed on 03/12/2024 Patient has been cleared by nephrologist for discharge today   Acute on chronic anemia secondary to CKD Symptomatic anemia - Patient refused PRBC and all other blood products    IDDM - SSI   HTN, uncontrolled Continue current antihypertensives   COPD - No acute concern, change as needed albuterol  to Atrovent  given uncontrolled A-fib,   Hx of stroke with residual right-sided weakness and right hand contracture - No new neurological deficits, continue Eliquis  and statin   DVT prophylaxis: Eliquis     Consultants: Nephrology, vascular surgery Procedures performed: Hemodialysis Disposition: Skilled nursing facility Diet recommendation:  Renal diet DISCHARGE MEDICATION: Allergies as of 03/12/2024       Reactions   Penicillins Rash, Dermatitis, Hives, Other (See Comments)   Other reaction(s): HIVES   Codeine  Nausea And Vomiting        Medication List     STOP taking these medications    amLODipine  5 MG tablet Commonly known as: NORVASC    furosemide  40 MG tablet Commonly known as: LASIX    irbesartan  75 MG tablet Commonly known as: AVAPRO    metolazone  2.5 MG tablet Commonly known as: ZAROXOLYN    sodium bicarbonate  650 MG tablet       TAKE these medications    Accu-Chek Guide test strip Generic drug: glucose blood Use to check blood sugars 4 times a day E11.65   Accu-Chek Softclix Lancets  lancets Use to check blood sugars 4 times a day   E11.65   acetaminophen  325 MG tablet Commonly known as: TYLENOL  Take 2 tablets (650 mg total) by mouth every 6 (six) hours as needed for mild pain or moderate pain (or Fever >/= 101).   albuterol  108 (90 Base) MCG/ACT inhaler Commonly known as:  VENTOLIN  HFA Inhale 1-2 puffs into the lungs every 4 (four) hours as needed for wheezing or shortness of breath.   apixaban  5 MG Tabs tablet Commonly known as: Eliquis  Take 1 tablet (5 mg total) by mouth 2 (two) times daily.   Breztri  Aerosphere 160-9-4.8 MCG/ACT Aero inhaler Generic drug: budesonide -glycopyrrolate -formoterol  Inhale 2 puffs into the lungs 2 (two) times daily.   carvedilol  3.125 MG tablet Commonly known as: COREG  Take 2 tablets (6.25 mg total) by mouth 2 (two) times daily with a meal. What changed: how much to take   cyanocobalamin  1000 MCG tablet Take 1 tablet (1,000 mcg total) by mouth daily.   empagliflozin  10 MG Tabs tablet Commonly known as: JARDIANCE  Take 1 tablet (10 mg total) by mouth daily.   ferrous sulfate  325 (65 FE) MG tablet Take 1 tablet (325 mg total) by mouth every other day.   hydrALAZINE  10 MG tablet Commonly known as: APRESOLINE  Take 1 tablet (10 mg total) by mouth every 8 (eight) hours.   insulin  aspart 100 UNIT/ML injection Commonly known as: novoLOG  Inject 0-5 Units into the skin 3 (three) times daily with meals. If eating and Blood Glucose (BG) 80 or higher inject 2 units for meal coverage and add correction dose per scale. If not eating, correction dose only. BG <150= 0 unit; BG 150-200= 1 unit; BG 201-250= 2 unit; BG 251-300= 3 unit; BG 301-350= 4 unit; BG 351-400= 5 unit; BG >400= 6 unit and Call Primary Care. What changed: how much to take   isosorbide  mononitrate 30 MG 24 hr tablet Commonly known as: IMDUR  Take 1 tablet (30 mg total) by mouth daily. Start taking on: March 13, 2024   LORazepam  0.5 MG tablet Commonly known as: ATIVAN  Take 0.5 mg by mouth every 8 (eight) hours.   methocarbamol  750 MG tablet Commonly known as: ROBAXIN  Take 1 tablet (750 mg total) by mouth every 6 (six) hours as needed for muscle spasms.   pantoprazole  40 MG tablet Commonly known as: PROTONIX  Take 1 tablet (40 mg total) by mouth daily.    rosuvastatin  10 MG tablet Commonly known as: CRESTOR  Take 1 tablet (10 mg total) by mouth at bedtime.   torsemide 20 MG tablet Commonly known as: DEMADEX Take 1 tablet (20 mg total) by mouth daily. Start taking on: March 13, 2024   True Metrix Air Glucose Meter Devi 1 Device by Does not apply route 2 (two) times daily.        Contact information for follow-up providers     [facility]. Go on 03/14/2024.   Specialty: Cardiology Why: Hospital Follow-Up 03/14/24 @ 10:00 [facility], [address], Second Floor Free Valet Parking at the [employer] information: [address] [place]  [address] [phone]             Contact information for after-discharge care     Destination     [facility] .   Service: Skilled Nursing Contact information: [address] [place]  [id number] [phone]  Discharge Exam: Filed Weights   03/11/24 0528 03/11/24 1234 03/12/24 0503  Weight: 86.1 kg 86.1 kg 85.6 kg   Constitutional: NAD, calm, comfortable  Neck: normal, supple, no masses, no thyromegaly Respiratory: clear to auscultation bilaterally, no wheezing, no crackles. Normal respiratory effort. No accessory muscle use.  Cardiovascular: RRR, no murmurs / rubs / gallops.  1+ extremity edema Abdomen: no tenderness, no masses palpated. No hepatosplenomegaly. Bowel sounds positive.  Musculoskeletal: Moving all extremities bilaterally Skin: no rashes, lesions, ulcers. No induration Neurologic: AO x3, No gross focal deficits  Condition at discharge: good  The results of significant diagnostics from this hospitalization (including imaging, microbiology, ancillary and laboratory) are listed below for reference.   Imaging Studies: PERIPHERAL VASCULAR CATHETERIZATION Result Date: 03/12/2024 See surgical note for result.  PERIPHERAL  VASCULAR CATHETERIZATION Result Date: 03/07/2024 See surgical note for result.  US  RENAL Result Date: 03/06/2024 CLINICAL DATA:  Acute kidney failure EXAM: RENAL / URINARY TRACT ULTRASOUND COMPLETE COMPARISON:  CT abdomen and pelvis 09/17/2022. FINDINGS: Right Kidney: Renal measurements: 16.3 x 9.3 x 8.5 cm = volume: 673 mL. There is no hydronephrosis. There is increased echogenicity throughout. There are numerous cystic areas throughout the right kidney. The largest is in the mid kidney measuring 7.8 x 7.0 x 8.9 cm similar to the prior study. There is a solid mass in the lower pole of the right kidney measuring 5.7 x 5.2 x 5.7 cm similar to the prior study. Left Kidney: Left kidney is surgically absent. There is a cystic area in the nephrectomy bed measuring 9.3 x 5.6 cm similar to prior CT. Bladder: Appears normal for degree of bladder distention. Other: None. IMPRESSION: 1. No hydronephrosis. 2. Increased echogenicity of the right kidney compatible with medical renal disease. 3. Stable solid mass in the lower pole of the right kidney measuring 5.7 x 5.2 x 5.7 cm. 4. Numerous cystic areas throughout the right kidney. 5. Status post left nephrectomy. 6. Stable cystic area in the nephrectomy bed measuring 9.3 x 5.6 cm similar to prior CT. Electronically Signed   By: Greig Pique M.D.   On: 03/06/2024 21:55   DG Cervical Spine 2 or 3 views Result Date: 03/04/2024 CLINICAL DATA:  Neck pain. EXAM: CERVICAL SPINE - 2-3 VIEW COMPARISON:  CT 07/31/2022 FINDINGS: Vertebral body alignment and heights are normal. There is moderate spondylosis of the cervical spine to include uncovertebral joint spurring and facet arthropathy. Minimal disc space narrowing at the C3-4 level. Prevertebral soft tissues are normal. IMPRESSION: Moderate spondylosis of the cervical spine with minimal disc disease at the C3-4 level. Electronically Signed   By: Toribio Agreste M.D.   On: 03/04/2024 12:33   DG Shoulder Right Portable Result  Date: 03/04/2024 EXAM: 1 VIEW XRAY OF THE RIGHT SHOULDER 03/04/2024 06:23:17 AM COMPARISON: None available. CLINICAL HISTORY: Chest and right shoulder pain. Patient arrived to ED from [facility] for right shoulder pain that radiates into arm. History of stroke before with right sided deficits, contractures. EMS gave 324 aspirin  and 2 nitroglycerin  sprays. CBG 186. Systolic BP was 190's. Patient alert and oriented times 3. Disoriented to year. FINDINGS: BONES AND JOINTS: Glenohumeral joint is normally aligned. No acute fracture or dislocation. Mild degenerative changes of the acromioclavicular joint. SOFT TISSUES: No abnormal calcifications. Visualized lung is unremarkable. IMPRESSION: 1. No acute findings. 2. Mild degenerative changes of the acromioclavicular joint. Electronically signed by: Waddell Calk MD 03/04/2024 06:31 AM EDT RP Workstation: [id number]   DG Chest Port 1 View Result  Date: 03/04/2024 EXAM: 1 VIEW XRAY OF THE CHEST 03/04/2024 06:23:17 AM COMPARISON: 12/17/2023 CLINICAL HISTORY: Chest and right shoulder pain. Patient arrived to ED from [facility] for right shoulder pain that radiates into arm. History of stroke before with right sided deficits, contractures. EMS gave 324 aspirin  and 2 nitroglycerin  sprays. CBG 186. Systolic BP was 190's. Patient alert and oriented times 3. Disoriented to year. FINDINGS: LUNGS AND PLEURA: Increased pulmonary vascular congestion. No focal pulmonary opacity. No pleural effusion. No pneumothorax. HEART AND MEDIASTINUM: Aortic atherosclerosis. No acute abnormality of the cardiac and mediastinal silhouettes. BONES AND SOFT TISSUES: No acute osseous abnormality. IMPRESSION: 1. Increased pulmonary vascular congestion. 2. Aortic atherosclerosis. Electronically signed by: Waddell Calk MD 03/04/2024 06:29 AM EDT RP Workstation: [id number]    Microbiology: Results for orders placed or performed during the hospital encounter of 11/22/23  Resp panel by  RT-PCR (RSV, Flu A&B, Covid) Anterior Nasal Swab     Status: None   Collection Time: 11/22/23  8:26 PM   Specimen: Anterior Nasal Swab  Result Value Ref Range Status   SARS Coronavirus 2 by RT PCR NEGATIVE NEGATIVE Final    Comment: (NOTE) SARS-CoV-2 target nucleic acids are NOT DETECTED.  The SARS-CoV-2 RNA is generally detectable in upper respiratory specimens during the acute phase of infection. The lowest concentration of SARS-CoV-2 viral copies this assay can detect is 138 copies/mL. A negative result does not preclude SARS-Cov-2 infection and should not be used as the sole basis for treatment or other patient management decisions. A negative result may occur with  improper specimen collection/handling, submission of specimen other than nasopharyngeal swab, presence of viral mutation(s) within the areas targeted by this assay, and inadequate number of viral copies(<138 copies/mL). A negative result must be combined with clinical observations, patient history, and epidemiological information. The expected result is Negative.  Fact Sheet for Patients:  BloggerCourse.com  Fact Sheet for Healthcare Providers:  SeriousBroker.it  This test is no t yet approved or cleared by the [place]  FDA and  has been authorized for detection and/or diagnosis of SARS-CoV-2 by FDA under an Emergency Use Authorization (EUA). This EUA will remain  in effect (meaning this test can be used) for the duration of the COVID-19 declaration under Section 564(b)(1) of the Act, 21 U.S.C.section 360bbb-3(b)(1), unless the authorization is terminated  or revoked sooner.       Influenza A by PCR NEGATIVE NEGATIVE Final   Influenza B by PCR NEGATIVE NEGATIVE Final    Comment: (NOTE) The Xpert Xpress SARS-CoV-2/FLU/RSV plus assay is intended as an aid in the diagnosis of influenza from Nasopharyngeal swab specimens and should not be used as a sole basis  for treatment. Nasal washings and aspirates are unacceptable for Xpert Xpress SARS-CoV-2/FLU/RSV testing.  Fact Sheet for Patients: BloggerCourse.com  Fact Sheet for Healthcare Providers: SeriousBroker.it  This test is not yet approved or cleared by the [place]  FDA and has been authorized for detection and/or diagnosis of SARS-CoV-2 by FDA under an Emergency Use Authorization (EUA). This EUA will remain in effect (meaning this test can be used) for the duration of the COVID-19 declaration under Section 564(b)(1) of the Act, 21 U.S.C. section 360bbb-3(b)(1), unless the authorization is terminated or revoked.     Resp Syncytial Virus by PCR NEGATIVE NEGATIVE Final    Comment: (NOTE) Fact Sheet for Patients: BloggerCourse.com  Fact Sheet for Healthcare Providers: SeriousBroker.it  This test is not yet approved or cleared by the [place]  FDA and has  been authorized for detection and/or diagnosis of SARS-CoV-2 by FDA under an Emergency Use Authorization (EUA). This EUA will remain in effect (meaning this test can be used) for the duration of the COVID-19 declaration under Section 564(b)(1) of the Act, 21 U.S.C. section 360bbb-3(b)(1), unless the authorization is terminated or revoked.  Performed at [facility], [address] [place], [place] [address]     Labs: CBC: Recent Labs  Lab 03/08/24 0507 03/09/24 0341 03/10/24 0438 03/11/24 0342 03/12/24 0523  WBC 7.1 7.4 8.0 8.6 7.4  NEUTROABS 3.9 4.2 4.5 4.9 4.8  HGB 7.5* 7.3* 7.3* 7.9* 7.6*  HCT 23.7* 22.8* 22.9* 24.3* 24.8*  MCV 93.7 93.4 93.5 92.0 94.7  PLT 178 185 183 189 174   Basic Metabolic Panel: Recent Labs  Lab 03/06/24 0358 03/07/24 0351 03/08/24 0507 03/09/24 0341 03/10/24 0438 03/11/24 0342 03/12/24 0523  NA 140   < > 139 137 135 138 138  K 5.0   < > 4.9 4.6 4.4 4.6 3.9  CL 108    < > 106 105 104 105 102  CO2 20*   < > 20* 22 23 22 26   GLUCOSE 113*   < > 107* 105* 119* 153* 120*  BUN 96*   < > 105* 86* 67* 86* 49*  CREATININE 5.55*   < > 5.89* 4.46* 4.01* 4.92* 3.15*  CALCIUM  8.2*   < > 8.2* 8.0* 7.8* 8.4* 8.5*  MG 1.7  --   --   --   --   --   --    < > = values in this interval not displayed.   Liver Function Tests: No results for input(s): AST, ALT, ALKPHOS, BILITOT, PROT, ALBUMIN  in the last 168 hours. CBG: Recent Labs  Lab 03/11/24 1131 03/11/24 1702 03/11/24 2045 03/12/24 0750 03/12/24 1051  GLUCAP 133* 146* 215* 115* 114*    Discharge time spent:  38 minutes.  Signed: Drue ONEIDA Potter, MD [facility] 03/12/2024

## 2024-03-12 NOTE — Interval H&P Note (Signed)
 History and Physical Interval Note:  03/12/2024 11:19 AM  Larry Callahan  has presented today for surgery, with the diagnosis of ESRD.  The various methods of treatment have been discussed with the patient and family. After consideration of risks, benefits and other options for treatment, the patient has consented to  Procedure(s): DIALYSIS/PERMA CATHETER INSERTION (N/A) as a surgical intervention.  The patient's history has been reviewed, patient examined, no change in status, stable for surgery.  I have reviewed the patient's chart and labs.  Questions were answered to the patient's satisfaction.     Cordella Shawl

## 2024-03-12 NOTE — Plan of Care (Signed)
  Problem: Metabolic: Goal: Ability to maintain appropriate glucose levels will improve Outcome: Progressing   Problem: Clinical Measurements: Goal: Respiratory complications will improve Outcome: Progressing

## 2024-03-12 NOTE — Progress Notes (Signed)
 Pt discharged back to [facility].

## 2024-03-13 ENCOUNTER — Telehealth: Payer: Self-pay | Admitting: Family

## 2024-03-13 DIAGNOSIS — N186 End stage renal disease: Secondary | ICD-10-CM | POA: Diagnosis not present

## 2024-03-13 DIAGNOSIS — E8770 Fluid overload, unspecified: Secondary | ICD-10-CM | POA: Diagnosis not present

## 2024-03-13 DIAGNOSIS — Z992 Dependence on renal dialysis: Secondary | ICD-10-CM | POA: Diagnosis not present

## 2024-03-13 DIAGNOSIS — N2581 Secondary hyperparathyroidism of renal origin: Secondary | ICD-10-CM | POA: Diagnosis not present

## 2024-03-13 NOTE — Progress Notes (Deleted)
 [facility] Note   Referring Physician: PCP: [facility] Cardiologist: Evalene Lunger, MD   Chief Complaint:    HPI:   Larry Callahan is a 68 yo male with a PMHx of nonischemic cardiomyopathy last EF measured in April 2022 was 35 to 40%, COPD, sleep apnea, chronic kidney disease stage IV, chronic anemia, hypertension, and diabetes mellitus.  Echo from 08/08/21 reviewed and showed and EF of 40-45%. There is mild to moderately decreased function of the left ventricle. The diastolic parameters are indeterminate. The RV systolic function is low normal.   RHC done 03/29/2019 and showed: Pulmonary capillary wedge pressure mean of 8 PA: 32/16 mean 23 RA mean of 10 RV 26/12/13  Cardiac output 8.17 Cardiac index 3.42 Final Conclusions:   Normal right heart pressures  Was in the ED 05/16/22 due to COPD exacerbation. Was in the ED 05/04/22 due to contusion after a fall. Was in the ED 02/22/22 due to concerns he was having a stroke and this was ruled out and he was released. Was in the ED 01/20/22 due to atypical chest pain where he was evaluated and released. Admitted 01/09/22 due to  nausea vomiting and noticing bright red blood in his vomit.  He also reported some mild shortness of breath, generalized weakness dizziness. Palliative care and GI consults obtained. EGD done.   Discharged after 3 days. Admitted 01/03/22 due to abdominal pain. Was given morphine  with subsequent dizziness. CT abdomen with umbilical hernia with no obstruction and concern of enlarging right renal complex cyst which need further investigation. Surgical consult obtained. Discharged the next day. Admitted 12/17/21 due to acute on chronic heart failure. Discharged after 3 days. Was in the ED 12/06/21 due to needing antibiotic refill as he lost previous prescription. Admitted 11/30/21 due to swelling and pain around the right eye. Ophthamology consult obtained. Started on IV antibiotics due to  periorbital cellulitis and then transitioned to oral antibiotics. Discharged after 4 days.   He presents for a follow-up visit with a chief complaint of minimal fatigue upon exertion. Describes this as chronic in nature. He has associated cough, shortness of breath, weight loss, leg weakness and anxiety along with this. He denies any difficulty sleeping, dizziness, abdominal distention, palpitations, pedal edema, chest pain, wheezing or weight gain.     Review of Systems: [y] = yes, [ ]  = no   General: Weight gain [ ] ; Weight loss [ ] ; Anorexia [ ] ; Fatigue [ ] ; Fever [ ] ; Chills [ ] ; Weakness [ ]   Cardiac: Chest pain/pressure [ ] ; Resting SOB [ ] ; Exertional SOB [ ] ; Orthopnea [ ] ; Pedal Edema [ ] ; Palpitations [ ] ; Syncope [ ] ; Presyncope [ ] ; Paroxysmal nocturnal dyspnea[ ]   Pulmonary: Cough [ ] ; Wheezing[ ] ; Hemoptysis[ ] ; Sputum [ ] ; Snoring [ ]   GI: Vomiting[ ] ; Dysphagia[ ] ; Melena[ ] ; Hematochezia [ ] ; Heartburn[ ] ; Abdominal pain [ ] ; Constipation [ ] ; Diarrhea [ ] ; BRBPR [ ]   GU: Hematuria[ ] ; Dysuria [ ] ; Nocturia[ ]   Vascular: Pain in legs with walking [ ] ; Pain in feet with lying flat [ ] ; Non-healing sores [ ] ; Stroke [ ] ; TIA [ ] ; Slurred speech [ ] ;  Neuro: Headaches[ ] ; Vertigo[ ] ; Seizures[ ] ; Paresthesias[ ] ;Blurred vision [ ] ; Diplopia [ ] ; Vision changes [ ]   Ortho/Skin: Arthritis [ ] ; Joint pain [ ] ; Muscle pain [ ] ; Joint swelling [ ] ; Back Pain [ ] ; Rash [ ]   Psych: Depression[ ] ; Anxiety[ ]   Heme:  Bleeding problems [ ] ; Clotting disorders [ ] ; Anemia [ ]   Endocrine: Diabetes [ ] ; Thyroid  dysfunction[ ]    Past Medical History:  Diagnosis Date   Allergy    Atrial flutter (HCC)    a. Dx 12/2020--CHA2DS2VASc = 5-->eliquis .   Chronic combined systolic (congestive) and diastolic (congestive) heart failure (HCC)    a. 04/2019 Echo: EF 45-50%; b. 02/2019 Echo: EF 55-60%; c. 09/2020 Echo: EF 35-40%, glob HK. Nl RV size/fxn. Mild MR; d. 07/2021 Echo: EF 40-45%, mild LVH, low-nl  RV fxn, no MR.   CKD (chronic kidney disease), stage III (HCC)    COPD (chronic obstructive pulmonary disease) (HCC)    Coronary artery disease    a. 2011 Cath: nonobs dzs; b. 09/2020 MV: EF 38%. No signif ischemia. ? inf MI vs diaph attenuation. GI uptake artifact.   Diabetes mellitus without complication (HCC)    GERD (gastroesophageal reflux disease)    History of CVA (cerebrovascular accident) 09/25/2020   Hyperlipidemia    Hypertension    Morbid (severe) obesity due to excess calories (HCC) 06/09/2017   Morbid obesity (HCC)    NICM (nonischemic cardiomyopathy) (HCC)    a. 04/2019 Echo: EF 45-50%; b. 02/2019 Echo: EF 55-60%; c. 09/2020 Echo: EF 35-40%, glob HK; d. 09/2020 MV: No ischemia. ? inf infarct vs attenuation; d. 07/2021 Echo: EF 40-45%.   OSA (obstructive sleep apnea)    Pneumonia due to COVID-19 virus 01/30/2020   Stage 3b chronic kidney disease (HCC)    Stroke (HCC) 2024   Syncope and collapse 11/16/2020    Current Outpatient Medications  Medication Sig Dispense Refill   ACCU-CHEK GUIDE test strip Use to check blood sugars 4 times a day E11.65 400 strip 0   Accu-Chek Softclix Lancets lancets Use to check blood sugars 4 times a day   E11.65 400 each 3   acetaminophen  (TYLENOL ) 325 MG tablet Take 2 tablets (650 mg total) by mouth every 6 (six) hours as needed for mild pain or moderate pain (or Fever >/= 101).     albuterol  (VENTOLIN  HFA) 108 (90 Base) MCG/ACT inhaler Inhale 1-2 puffs into the lungs every 4 (four) hours as needed for wheezing or shortness of breath. 18 g 0   apixaban  (ELIQUIS ) 5 MG TABS tablet Take 1 tablet (5 mg total) by mouth 2 (two) times daily. 60 tablet 5   Blood Glucose Monitoring Suppl (TRUE METRIX AIR GLUCOSE METER) DEVI 1 Device by Does not apply route 2 (two) times daily. 1 Device 0   budeson-glycopyrrolate -formoterol  (BREZTRI  AEROSPHERE) 160-9-4.8 MCG/ACT AERO inhaler Inhale 2 puffs into the lungs 2 (two) times daily. (Patient not taking: Reported on  03/04/2024) 2 each 3   carvedilol  (COREG ) 3.125 MG tablet Take 2 tablets (6.25 mg total) by mouth 2 (two) times daily with a meal.     cyanocobalamin  1000 MCG tablet Take 1 tablet (1,000 mcg total) by mouth daily. 90 tablet 1   empagliflozin  (JARDIANCE ) 10 MG TABS tablet Take 1 tablet (10 mg total) by mouth daily. 30 tablet 0   ferrous sulfate  325 (65 FE) MG tablet Take 1 tablet (325 mg total) by mouth every other day.     hydrALAZINE  (APRESOLINE ) 10 MG tablet Take 1 tablet (10 mg total) by mouth every 8 (eight) hours.     insulin  aspart (NOVOLOG ) 100 UNIT/ML injection Inject 0-5 Units into the skin 3 (three) times daily with meals. If eating and Blood Glucose (BG) 80 or higher inject 2 units for meal coverage  and add correction dose per scale. If not eating, correction dose only. BG <150= 0 unit; BG 150-200= 1 unit; BG 201-250= 2 unit; BG 251-300= 3 unit; BG 301-350= 4 unit; BG 351-400= 5 unit; BG >400= 6 unit and Call Primary Care. 10 mL 0   isosorbide  mononitrate (IMDUR ) 30 MG 24 hr tablet Take 1 tablet (30 mg total) by mouth daily.     LORazepam  (ATIVAN ) 0.5 MG tablet Take 0.5 mg by mouth every 8 (eight) hours.     methocarbamol  (ROBAXIN ) 750 MG tablet Take 1 tablet (750 mg total) by mouth every 6 (six) hours as needed for muscle spasms. 120 tablet 1   pantoprazole  (PROTONIX ) 40 MG tablet Take 1 tablet (40 mg total) by mouth daily.     rosuvastatin  (CRESTOR ) 10 MG tablet Take 1 tablet (10 mg total) by mouth at bedtime. 90 tablet 1   torsemide (DEMADEX) 20 MG tablet Take 1 tablet (20 mg total) by mouth daily.     No current facility-administered medications for this visit.    Allergies  Allergen Reactions   Penicillins Rash, Dermatitis, Hives and Other (See Comments)    Other reaction(s): HIVES   Codeine  Nausea And Vomiting      Social History   Socioeconomic History   Marital status: Single    Spouse name: Not on file   Number of children: Not on file   Years of education: Not on  file   Highest education level: Not on file  Occupational History   Not on file  Tobacco Use   Smoking status: Every Day    Current packs/day: 0.00    Types: Cigarettes    Last attempt to quit: 03/13/2020    Years since quitting: 4.0   Smokeless tobacco: Former    Types: Associate Professor status: Never Used  Substance and Sexual Activity   Alcohol use: No   Drug use: No   Sexual activity: Not Currently  Other Topics Concern   Not on file  Social History Narrative   Not on file   Social Drivers of Health   Financial Resource Strain: Low Risk  (11/16/2023)   Overall Financial Resource Strain (CARDIA)    Difficulty of Paying Living Expenses: Not very hard  Food Insecurity: No Food Insecurity (03/04/2024)   Hunger Vital Sign    Worried About Running Out of Food in the Last Year: Never true    Ran Out of Food in the Last Year: Never true  Transportation Needs: No Transportation Needs (03/04/2024)   PRAPARE - Administrator, Civil Service (Medical): No    Lack of Transportation (Non-Medical): No  Physical Activity: Not on file  Stress: Not on file  Social Connections: Unknown (11/23/2023)   Social Connection and Isolation Panel    Frequency of Communication with Friends and Family: Once a week    Frequency of Social Gatherings with Friends and Family: Once a week    Attends Religious Services: 1 to 4 times per year    Active Member of [employer] or Organizations: No    Attends Banker Meetings: Patient declined    Marital Status: Patient declined  Intimate Partner Violence: Not At Risk (03/04/2024)   Humiliation, Afraid, Rape, and Kick questionnaire    Fear of Current or Ex-Partner: No    Emotionally Abused: No    Physically Abused: No    Sexually Abused: No      Family History  Problem Relation  Age of Onset   Diabetes Mother        2003   Lung cancer Father    Diabetes Sister    Hypertension Sister    Heart disease Sister    Cancer Sister     Bone cancer Brother    Prostate cancer Neg Hx    Bladder Cancer Neg Hx    Kidney cancer Neg Hx     There were no vitals filed for this visit.   PHYSICAL EXAM: General:  Well appearing. No respiratory difficulty HEENT: normal Neck: supple. no JVD. Carotids 2+ bilat; no bruits. No lymphadenopathy or thyromegaly appreciated. Cor: PMI nondisplaced. Regular rate & rhythm. No rubs, gallops or murmurs. Lungs: clear Abdomen: soft, nontender, nondistended. No hepatosplenomegaly. No bruits or masses. Good bowel sounds. Extremities: no cyanosis, clubbing, rash, edema Neuro: alert & oriented x 3, cranial nerves grossly intact. moves all 4 extremities w/o difficulty. Affect pleasant.  ECG:   ASSESSMENT & PLAN:  Chronic Heart failure with reduced ejection fraction- - NYHA II - euvolemic today - weighing daily; reminded to call for an overnight weight gain of > 2 pounds or a weekly weight gain of > 5 pounds - weight down 5 pounds from last visit here 2 months ago - not adding salt to his food - participating in paramedicine program  - saw cardiology Florestine) 02/09/22 - on GDMT coreg   - renal function may limit other GDMT; may need nephrology referral in the future - could consider jardiance  if renal function remains stable - BNP 12/17/21 was 122.8 - has received his flu vaccine for this season  2. HTN with CKD- - BP looks good (134/83) - saw PCP (Abernathy) 03/29/22 - BMP 05/16/22 reviewed and showed Na 136, K 4.0, Cr 2.61 & gfr 26  3. DM-  - A1C 7.0% 08/06/21 - not checking his glucose at home because he says something is wrong with his meter; advised him to take the meter when he goes to his PCP and have them look at it; can also try to replace the batteries  4: OSA- - wears CPAP with O2 HS, for all hours I sleep, including naps   Medication bottles reviewed.   Return in 6 months, sooner if needed.    Ellouise DELENA Class, FNP 03/13/24

## 2024-03-13 NOTE — Telephone Encounter (Signed)
 Called to confirm/remind patient of their appointment at the [facility] on 03/14/24.   Appointment:   [] Confirmed  [x] Left mess   [] No answer/No voice mail  [] VM Full/unable to leave message  [] Phone not in service  Patient reminded to bring all medications and/or complete list.  Confirmed patient has transportation. Gave directions, instructed to utilize valet parking.

## 2024-03-14 ENCOUNTER — Encounter: Admitting: Family

## 2024-03-15 DIAGNOSIS — N2581 Secondary hyperparathyroidism of renal origin: Secondary | ICD-10-CM | POA: Diagnosis not present

## 2024-03-15 DIAGNOSIS — E8770 Fluid overload, unspecified: Secondary | ICD-10-CM | POA: Diagnosis not present

## 2024-03-15 DIAGNOSIS — Z992 Dependence on renal dialysis: Secondary | ICD-10-CM | POA: Diagnosis not present

## 2024-03-18 ENCOUNTER — Telehealth: Payer: Self-pay | Admitting: Family

## 2024-03-18 DIAGNOSIS — L02222 Furuncle of back [any part, except buttock]: Secondary | ICD-10-CM | POA: Diagnosis not present

## 2024-03-18 DIAGNOSIS — E8770 Fluid overload, unspecified: Secondary | ICD-10-CM | POA: Diagnosis not present

## 2024-03-18 DIAGNOSIS — K029 Dental caries, unspecified: Secondary | ICD-10-CM | POA: Diagnosis not present

## 2024-03-18 DIAGNOSIS — I5020 Unspecified systolic (congestive) heart failure: Secondary | ICD-10-CM | POA: Diagnosis not present

## 2024-03-18 DIAGNOSIS — Z992 Dependence on renal dialysis: Secondary | ICD-10-CM | POA: Diagnosis not present

## 2024-03-18 DIAGNOSIS — R600 Localized edema: Secondary | ICD-10-CM | POA: Diagnosis not present

## 2024-03-18 DIAGNOSIS — I110 Hypertensive heart disease with heart failure: Secondary | ICD-10-CM | POA: Diagnosis not present

## 2024-03-18 DIAGNOSIS — Z794 Long term (current) use of insulin: Secondary | ICD-10-CM | POA: Diagnosis not present

## 2024-03-18 DIAGNOSIS — N186 End stage renal disease: Secondary | ICD-10-CM | POA: Diagnosis not present

## 2024-03-18 DIAGNOSIS — E1122 Type 2 diabetes mellitus with diabetic chronic kidney disease: Secondary | ICD-10-CM | POA: Diagnosis not present

## 2024-03-18 DIAGNOSIS — N2581 Secondary hyperparathyroidism of renal origin: Secondary | ICD-10-CM | POA: Diagnosis not present

## 2024-03-18 DIAGNOSIS — N185 Chronic kidney disease, stage 5: Secondary | ICD-10-CM | POA: Diagnosis not present

## 2024-03-18 NOTE — Telephone Encounter (Signed)
 Called to confirm/remind patient of their appointment at the [facility] on 03/19/24.   Appointment:   [] Confirmed  [x] Left mess   [] No answer/No voice mail  [] VM Full/unable to leave message  [] Phone not in service  Patient reminded to bring all medications and/or complete list.  Confirmed patient has transportation. Gave directions, instructed to utilize valet parking.

## 2024-03-19 ENCOUNTER — Encounter: Payer: Self-pay | Admitting: Family

## 2024-03-19 ENCOUNTER — Ambulatory Visit: Attending: Family | Admitting: Family

## 2024-03-19 VITALS — BP 137/70 | HR 79

## 2024-03-19 DIAGNOSIS — I5032 Chronic diastolic (congestive) heart failure: Secondary | ICD-10-CM | POA: Diagnosis not present

## 2024-03-19 DIAGNOSIS — I10 Essential (primary) hypertension: Secondary | ICD-10-CM

## 2024-03-19 DIAGNOSIS — J449 Chronic obstructive pulmonary disease, unspecified: Secondary | ICD-10-CM | POA: Diagnosis not present

## 2024-03-19 DIAGNOSIS — Z992 Dependence on renal dialysis: Secondary | ICD-10-CM | POA: Diagnosis not present

## 2024-03-19 DIAGNOSIS — D631 Anemia in chronic kidney disease: Secondary | ICD-10-CM | POA: Insufficient documentation

## 2024-03-19 DIAGNOSIS — Z7901 Long term (current) use of anticoagulants: Secondary | ICD-10-CM | POA: Diagnosis not present

## 2024-03-19 DIAGNOSIS — Z7984 Long term (current) use of oral hypoglycemic drugs: Secondary | ICD-10-CM | POA: Diagnosis not present

## 2024-03-19 DIAGNOSIS — N186 End stage renal disease: Secondary | ICD-10-CM | POA: Insufficient documentation

## 2024-03-19 DIAGNOSIS — Z794 Long term (current) use of insulin: Secondary | ICD-10-CM | POA: Diagnosis not present

## 2024-03-19 DIAGNOSIS — I132 Hypertensive heart and chronic kidney disease with heart failure and with stage 5 chronic kidney disease, or end stage renal disease: Secondary | ICD-10-CM | POA: Diagnosis not present

## 2024-03-19 DIAGNOSIS — N2581 Secondary hyperparathyroidism of renal origin: Secondary | ICD-10-CM | POA: Diagnosis not present

## 2024-03-19 DIAGNOSIS — G4733 Obstructive sleep apnea (adult) (pediatric): Secondary | ICD-10-CM | POA: Diagnosis not present

## 2024-03-19 DIAGNOSIS — I428 Other cardiomyopathies: Secondary | ICD-10-CM | POA: Diagnosis not present

## 2024-03-19 DIAGNOSIS — E1122 Type 2 diabetes mellitus with diabetic chronic kidney disease: Secondary | ICD-10-CM | POA: Insufficient documentation

## 2024-03-19 DIAGNOSIS — Z87891 Personal history of nicotine dependence: Secondary | ICD-10-CM | POA: Diagnosis not present

## 2024-03-19 DIAGNOSIS — E8770 Fluid overload, unspecified: Secondary | ICD-10-CM | POA: Diagnosis not present

## 2024-03-19 DIAGNOSIS — R5383 Other fatigue: Secondary | ICD-10-CM | POA: Diagnosis present

## 2024-03-19 NOTE — Patient Instructions (Signed)
 Begin weighing daily and call for an overnight weight gain of 3 pounds or more or a weekly weight gain of more than 5 pounds.   Follow with provider at SNF and continue with dialysis. Follow up at [facility] Failure Clinic as needed.

## 2024-03-19 NOTE — Progress Notes (Signed)
 [facility] Note   Referring Physician: 09/25 admission PCP: [facility] Cardiologist: Evalene Lunger, MD   Chief Complaint: fatigue   HPI:   Larry Callahan is a 68 yo male with a PMHx of nonischemic cardiomyopathy last EF measured in April 2022 was 35 to 40%, COPD, sleep apnea, chronic kidney disease stage IV, chronic anemia, hypertension, and diabetes mellitus.  RHC done 03/29/2019 and showed: Pulmonary capillary wedge pressure mean of 8 PA: 32/16 mean 23 RA mean of 10 RV 26/12/13  Cardiac output 8.17 Cardiac index 3.42 Final Conclusions:   Normal right heart pressures  Echo 08/08/21: EF of 40-45%. There is mild to moderately decreased function of the left ventricle. The diastolic parameters are indeterminate. The RV systolic function is low normal.   Was in the ED 05/16/22 due to COPD exacerbation.   Was in the ED 02/22/22 due to concerns he was having a stroke and this was ruled out and he was released.   Admitted 01/03/22 due to abdominal pain. Was given morphine  with subsequent dizziness. CT abdomen with umbilical hernia with no obstruction and concern of enlarging right renal complex cyst which need further investigation. Surgical consult obtained. Discharged the next dayAdmitted 01/09/22 due to  nausea vomiting and noticing bright red blood in his vomit.  He also reported some mild shortness of breath, generalized weakness dizziness. Palliative care and GI consults obtained. EGD done.     Echo 11/05/23: EF 50-55% with mild LVH, G1DD  Since 12/23, he's been admitted 11 times and had 12 ER visits. Most recent admission was 03/04/24 with new onset of right shoulder and right upper arm pain and shortness of breath. Significant fluid overload. Anemia worsened and he had breakthrough AF RVR. XR right shoulder: Mild degenerative changes of acromioclavicular joint. Renal function worsened. Nephrology/ vascular consulted and permacath was placed. Dialysis  started. Patient refused blood transfusions. Elevated troponin thought to be due to demand ischemia.   He presents for a post-hospital HF visit although hasn't been seen since 12/23. He presents with a chief complaint of minimal fatigue. Denies any shortness of breath, chest pain, palpitations, edema, dizziness or difficulty sleeping. Patient says that he gets dialysis 4 days / week. Reports having a good appetite. Says that he's not getting any PT currently at facility. He declines getting weighed today.   ROS: All systems negative except what is listed in HPI, PMH and Problem List   Past Medical History:  Diagnosis Date   Allergy    Atrial flutter (HCC)    a. Dx 12/2020--CHA2DS2VASc = 5-->eliquis .   Chronic combined systolic (congestive) and diastolic (congestive) heart failure (HCC)    a. 04/2019 Echo: EF 45-50%; b. 02/2019 Echo: EF 55-60%; c. 09/2020 Echo: EF 35-40%, glob HK. Nl RV size/fxn. Mild MR; d. 07/2021 Echo: EF 40-45%, mild LVH, low-nl RV fxn, no MR.   CKD (chronic kidney disease), stage III (HCC)    COPD (chronic obstructive pulmonary disease) (HCC)    Coronary artery disease    a. 2011 Cath: nonobs dzs; b. 09/2020 MV: EF 38%. No signif ischemia. ? inf MI vs diaph attenuation. GI uptake artifact.   Diabetes mellitus without complication (HCC)    GERD (gastroesophageal reflux disease)    History of CVA (cerebrovascular accident) 09/25/2020   Hyperlipidemia    Hypertension    Morbid (severe) obesity due to excess calories (HCC) 06/09/2017   Morbid obesity (HCC)    NICM (nonischemic cardiomyopathy) (HCC)    a. 04/2019 Echo:  EF 45-50%; b. 02/2019 Echo: EF 55-60%; c. 09/2020 Echo: EF 35-40%, glob HK; d. 09/2020 MV: No ischemia. ? inf infarct vs attenuation; d. 07/2021 Echo: EF 40-45%.   OSA (obstructive sleep apnea)    Pneumonia due to COVID-19 virus 01/30/2020   Stage 3b chronic kidney disease (HCC)    Stroke (HCC) 2024   Syncope and collapse 11/16/2020    Current Outpatient  Medications  Medication Sig Dispense Refill   ACCU-CHEK GUIDE test strip Use to check blood sugars 4 times a day E11.65 400 strip 0   Accu-Chek Softclix Lancets lancets Use to check blood sugars 4 times a day   E11.65 400 each 3   acetaminophen  (TYLENOL ) 325 MG tablet Take 2 tablets (650 mg total) by mouth every 6 (six) hours as needed for mild pain or moderate pain (or Fever >/= 101).     albuterol  (VENTOLIN  HFA) 108 (90 Base) MCG/ACT inhaler Inhale 1-2 puffs into the lungs every 4 (four) hours as needed for wheezing or shortness of breath. 18 g 0   apixaban  (ELIQUIS ) 5 MG TABS tablet Take 1 tablet (5 mg total) by mouth 2 (two) times daily. 60 tablet 5   Blood Glucose Monitoring Suppl (TRUE METRIX AIR GLUCOSE METER) DEVI 1 Device by Does not apply route 2 (two) times daily. 1 Device 0   budeson-glycopyrrolate -formoterol  (BREZTRI  AEROSPHERE) 160-9-4.8 MCG/ACT AERO inhaler Inhale 2 puffs into the lungs 2 (two) times daily. (Patient not taking: Reported on 03/04/2024) 2 each 3   carvedilol  (COREG ) 3.125 MG tablet Take 2 tablets (6.25 mg total) by mouth 2 (two) times daily with a meal.     cyanocobalamin  1000 MCG tablet Take 1 tablet (1,000 mcg total) by mouth daily. 90 tablet 1   empagliflozin  (JARDIANCE ) 10 MG TABS tablet Take 1 tablet (10 mg total) by mouth daily. 30 tablet 0   ferrous sulfate  325 (65 FE) MG tablet Take 1 tablet (325 mg total) by mouth every other day.     hydrALAZINE  (APRESOLINE ) 10 MG tablet Take 1 tablet (10 mg total) by mouth every 8 (eight) hours.     insulin  aspart (NOVOLOG ) 100 UNIT/ML injection Inject 0-5 Units into the skin 3 (three) times daily with meals. If eating and Blood Glucose (BG) 80 or higher inject 2 units for meal coverage and add correction dose per scale. If not eating, correction dose only. BG <150= 0 unit; BG 150-200= 1 unit; BG 201-250= 2 unit; BG 251-300= 3 unit; BG 301-350= 4 unit; BG 351-400= 5 unit; BG >400= 6 unit and Call Primary Care. 10 mL 0    isosorbide  mononitrate (IMDUR ) 30 MG 24 hr tablet Take 1 tablet (30 mg total) by mouth daily.     LORazepam  (ATIVAN ) 0.5 MG tablet Take 0.5 mg by mouth every 8 (eight) hours.     methocarbamol  (ROBAXIN ) 750 MG tablet Take 1 tablet (750 mg total) by mouth every 6 (six) hours as needed for muscle spasms. 120 tablet 1   pantoprazole  (PROTONIX ) 40 MG tablet Take 1 tablet (40 mg total) by mouth daily.     rosuvastatin  (CRESTOR ) 10 MG tablet Take 1 tablet (10 mg total) by mouth at bedtime. 90 tablet 1   torsemide (DEMADEX) 20 MG tablet Take 1 tablet (20 mg total) by mouth daily.     No current facility-administered medications for this visit.    Allergies  Allergen Reactions   Penicillins Rash, Dermatitis, Hives and Other (See Comments)    Other reaction(s): HIVES  Codeine  Nausea And Vomiting      Social History   Socioeconomic History   Marital status: Single    Spouse name: Not on file   Number of children: Not on file   Years of education: Not on file   Highest education level: Not on file  Occupational History   Not on file  Tobacco Use   Smoking status: Every Day    Current packs/day: 0.00    Types: Cigarettes    Last attempt to quit: 03/13/2020    Years since quitting: 4.0   Smokeless tobacco: Former    Types: Associate Professor status: Never Used  Substance and Sexual Activity   Alcohol use: No   Drug use: No   Sexual activity: Not Currently  Other Topics Concern   Not on file  Social History Narrative   Not on file   Social Drivers of Health   Financial Resource Strain: Low Risk  (11/16/2023)   Overall Financial Resource Strain (CARDIA)    Difficulty of Paying Living Expenses: Not very hard  Food Insecurity: No Food Insecurity (03/04/2024)   Hunger Vital Sign    Worried About Running Out of Food in the Last Year: Never true    Ran Out of Food in the Last Year: Never true  Transportation Needs: No Transportation Needs (03/04/2024)   PRAPARE -  Administrator, Civil Service (Medical): No    Lack of Transportation (Non-Medical): No  Physical Activity: Not on file  Stress: Not on file  Social Connections: Unknown (11/23/2023)   Social Connection and Isolation Panel    Frequency of Communication with Friends and Family: Once a week    Frequency of Social Gatherings with Friends and Family: Once a week    Attends Religious Services: 1 to 4 times per year    Active Member of [employer] or Organizations: No    Attends Banker Meetings: Patient declined    Marital Status: Patient declined  Catering manager Violence: Not At Risk (03/04/2024)   Humiliation, Afraid, Rape, and Kick questionnaire    Fear of Current or Ex-Partner: No    Emotionally Abused: No    Physically Abused: No    Sexually Abused: No      Family History  Problem Relation Age of Onset   Diabetes Mother        2003   Lung cancer Father    Diabetes Sister    Hypertension Sister    Heart disease Sister    Cancer Sister    Bone cancer Brother    Prostate cancer Neg Hx    Bladder Cancer Neg Hx    Kidney cancer Neg Hx    Vitals:   03/19/24 1445  BP: 137/70  Pulse: 79  SpO2: 99%   Wt Readings from Last 3 Encounters:  03/12/24 188 lb 12.8 oz (85.6 kg)  12/26/23 182 lb 5.1 oz (82.7 kg)  12/18/23 182 lb 5.1 oz (82.7 kg)   Lab Results  Component Value Date   CREATININE 3.15 (H) 03/12/2024   CREATININE 4.92 (H) 03/11/2024   CREATININE 4.01 (H) 03/10/2024    PHYSICAL EXAM:  General: Elderly male in wheelchair  Cor: No JVD. Regular rhythm, rate.  Lungs: clear. Dialysis catheter in right upper chest wall Abdomen: soft, nontender, nondistended. Extremities: trace pitting edema Neuro:. Affect pleasant   ECG: not done   ASSESSMENT & PLAN:  Chronic Heart failure with now preserved ejection fraction- - suspect  due to HTN/ OSA - NYHA II - euvolemic today - order written for daily weights at SNF-  - Echo 08/08/21: EF 40-45%, mild  LVH, low normal RV - Echo 11/05/23: EF 50-55% with mild LVH, G1DD - not adding salt to his food - continue jardiance  10mg  daily - Renal function/ dialysis will not allow for MRA - saw cardiology [facility]) 03/25 - BNP 03/04/24 was 652.0  2. HTN with CKD- - BP 137/70 - saw PCP (Abernathy) 10/23 - BMP 03/12/24 reviewed: Na 138, K 3.9, Cr 3.15 & gfr 20 - order for BMET at facility on 03/21/24 with results faxed to us   3. DM (managed by PCP at facility)-  - A1C 09/18/23 was 6.7% - using insulin   4: OSA- - wears CPAP with O2 HS - saw pulmonology (Abernathy) 05/25  5: CKD- - permacath placed 09/25 for dialysis - saw nephrology Geoffry) 09/25   Due to patient getting dialysis and stable HF symptoms, will not make a return appointment at this time.   I spent 35 minutes reviewing records, interviewing/ examing patient and managing plan/ orders.     Ellouise DELENA Class, FNP 03/19/24

## 2024-03-20 DIAGNOSIS — N2581 Secondary hyperparathyroidism of renal origin: Secondary | ICD-10-CM | POA: Diagnosis not present

## 2024-03-20 DIAGNOSIS — K148 Other diseases of tongue: Secondary | ICD-10-CM | POA: Diagnosis not present

## 2024-03-20 DIAGNOSIS — E8770 Fluid overload, unspecified: Secondary | ICD-10-CM | POA: Diagnosis not present

## 2024-03-20 DIAGNOSIS — N186 End stage renal disease: Secondary | ICD-10-CM | POA: Diagnosis not present

## 2024-03-20 DIAGNOSIS — Z992 Dependence on renal dialysis: Secondary | ICD-10-CM | POA: Diagnosis not present

## 2024-03-21 LAB — BASIC METABOLIC PANEL WITH GFR: EGFR: 17

## 2024-03-22 DIAGNOSIS — E8770 Fluid overload, unspecified: Secondary | ICD-10-CM | POA: Diagnosis not present

## 2024-03-22 DIAGNOSIS — Z992 Dependence on renal dialysis: Secondary | ICD-10-CM | POA: Diagnosis not present

## 2024-03-22 DIAGNOSIS — N186 End stage renal disease: Secondary | ICD-10-CM | POA: Diagnosis not present

## 2024-03-25 DIAGNOSIS — N186 End stage renal disease: Secondary | ICD-10-CM | POA: Diagnosis not present

## 2024-03-25 DIAGNOSIS — N2581 Secondary hyperparathyroidism of renal origin: Secondary | ICD-10-CM | POA: Diagnosis not present

## 2024-03-25 DIAGNOSIS — Z992 Dependence on renal dialysis: Secondary | ICD-10-CM | POA: Diagnosis not present

## 2024-03-26 DIAGNOSIS — N186 End stage renal disease: Secondary | ICD-10-CM | POA: Diagnosis not present

## 2024-03-26 DIAGNOSIS — N2581 Secondary hyperparathyroidism of renal origin: Secondary | ICD-10-CM | POA: Diagnosis not present

## 2024-03-26 DIAGNOSIS — Z992 Dependence on renal dialysis: Secondary | ICD-10-CM | POA: Diagnosis not present

## 2024-03-26 DIAGNOSIS — E8770 Fluid overload, unspecified: Secondary | ICD-10-CM | POA: Diagnosis not present

## 2024-03-29 DIAGNOSIS — E8770 Fluid overload, unspecified: Secondary | ICD-10-CM | POA: Diagnosis not present

## 2024-04-03 DIAGNOSIS — E8770 Fluid overload, unspecified: Secondary | ICD-10-CM | POA: Diagnosis not present

## 2024-04-03 DIAGNOSIS — Z992 Dependence on renal dialysis: Secondary | ICD-10-CM | POA: Diagnosis not present

## 2024-04-03 DIAGNOSIS — N2581 Secondary hyperparathyroidism of renal origin: Secondary | ICD-10-CM | POA: Diagnosis not present

## 2024-04-04 DIAGNOSIS — S5012XA Contusion of left forearm, initial encounter: Secondary | ICD-10-CM | POA: Diagnosis not present

## 2024-04-04 DIAGNOSIS — E1122 Type 2 diabetes mellitus with diabetic chronic kidney disease: Secondary | ICD-10-CM | POA: Diagnosis not present

## 2024-04-04 DIAGNOSIS — Z7901 Long term (current) use of anticoagulants: Secondary | ICD-10-CM | POA: Diagnosis not present

## 2024-04-04 DIAGNOSIS — D692 Other nonthrombocytopenic purpura: Secondary | ICD-10-CM | POA: Diagnosis not present

## 2024-04-04 DIAGNOSIS — I129 Hypertensive chronic kidney disease with stage 1 through stage 4 chronic kidney disease, or unspecified chronic kidney disease: Secondary | ICD-10-CM | POA: Diagnosis not present

## 2024-04-04 DIAGNOSIS — D6869 Other thrombophilia: Secondary | ICD-10-CM | POA: Diagnosis not present

## 2024-04-04 DIAGNOSIS — L02222 Furuncle of back [any part, except buttock]: Secondary | ICD-10-CM | POA: Diagnosis not present

## 2024-04-04 DIAGNOSIS — N186 End stage renal disease: Secondary | ICD-10-CM | POA: Diagnosis not present

## 2024-04-04 DIAGNOSIS — Z992 Dependence on renal dialysis: Secondary | ICD-10-CM | POA: Diagnosis not present

## 2024-04-05 DIAGNOSIS — Z992 Dependence on renal dialysis: Secondary | ICD-10-CM | POA: Diagnosis not present

## 2024-04-05 DIAGNOSIS — N186 End stage renal disease: Secondary | ICD-10-CM | POA: Diagnosis not present

## 2024-04-05 DIAGNOSIS — N2581 Secondary hyperparathyroidism of renal origin: Secondary | ICD-10-CM | POA: Diagnosis not present

## 2024-04-05 DIAGNOSIS — E8770 Fluid overload, unspecified: Secondary | ICD-10-CM | POA: Diagnosis not present

## 2024-04-08 DIAGNOSIS — N2581 Secondary hyperparathyroidism of renal origin: Secondary | ICD-10-CM | POA: Diagnosis not present

## 2024-04-08 DIAGNOSIS — E8770 Fluid overload, unspecified: Secondary | ICD-10-CM | POA: Diagnosis not present

## 2024-04-08 DIAGNOSIS — N186 End stage renal disease: Secondary | ICD-10-CM | POA: Diagnosis not present

## 2024-04-08 DIAGNOSIS — Z992 Dependence on renal dialysis: Secondary | ICD-10-CM | POA: Diagnosis not present

## 2024-04-12 DIAGNOSIS — N2581 Secondary hyperparathyroidism of renal origin: Secondary | ICD-10-CM | POA: Diagnosis not present

## 2024-04-12 DIAGNOSIS — N186 End stage renal disease: Secondary | ICD-10-CM | POA: Diagnosis not present

## 2024-04-12 DIAGNOSIS — Z992 Dependence on renal dialysis: Secondary | ICD-10-CM | POA: Diagnosis not present

## 2024-04-12 DIAGNOSIS — E8770 Fluid overload, unspecified: Secondary | ICD-10-CM | POA: Diagnosis not present

## 2024-05-07 ENCOUNTER — Other Ambulatory Visit (INDEPENDENT_AMBULATORY_CARE_PROVIDER_SITE_OTHER): Payer: Self-pay | Admitting: Vascular Surgery

## 2024-05-07 DIAGNOSIS — N186 End stage renal disease: Secondary | ICD-10-CM

## 2024-05-13 ENCOUNTER — Encounter (INDEPENDENT_AMBULATORY_CARE_PROVIDER_SITE_OTHER)

## 2024-05-13 ENCOUNTER — Encounter (INDEPENDENT_AMBULATORY_CARE_PROVIDER_SITE_OTHER): Admitting: Vascular Surgery

## 2024-05-13 NOTE — Progress Notes (Deleted)
 MRN : [id number]  Larry Callahan is a 68 y.o. (15-May-1956) male who presents with chief complaint of check access.  History of Present Illness:   The patient is seen for evaluation for dialysis access. The patient has chronic renal insufficiency stage V secondary to hypertension. The patient's most recent creatinine clearance is less than 20. The patient volume status has not yet become an issue. Patient's blood pressures been relatively well controlled. There are mild uremic symptoms which appear to be relatively well tolerated at this time.  The patient notes the kidney problem has been present for a long time and has been progressively getting worse.  The patient is followed by nephrology.    The patient is right-handed.  The patient has been considering the various methods of dialysis and wishes to proceed with hemodialysis and therefore creation of AV access is indicated.  No recent shortening of the patient's walking distance or new symptoms consistent with claudication.  No history of rest pain symptoms. No new ulcers or wounds of the lower extremities have occurred.  The patient denies amaurosis fugax or recent TIA symptoms. There are no recent neurological changes noted. There is no history of DVT, PE or superficial thrombophlebitis. No recent episodes of angina or shortness of breath documented.   No outpatient medications have been marked as taking for the 05/13/24 encounter (Appointment) with Jama, Cordella MATSU, MD.    Past Medical History:  Diagnosis Date   Allergy    Atrial flutter (HCC)    a. Dx 12/2020--CHA2DS2VASc = 5-->eliquis .   Chronic combined systolic (congestive) and diastolic (congestive) heart failure (HCC)    a. 04/2019 Echo: EF 45-50%; b. 02/2019 Echo: EF 55-60%; c. 09/2020 Echo: EF 35-40%, glob HK. Nl RV size/fxn. Mild MR; d. 07/2021 Echo: EF 40-45%, mild LVH, low-nl RV fxn, no MR.   CKD (chronic kidney disease), stage III (HCC)     COPD (chronic obstructive pulmonary disease) (HCC)    Coronary artery disease    a. 2011 Cath: nonobs dzs; b. 09/2020 MV: EF 38%. No signif ischemia. ? inf MI vs diaph attenuation. GI uptake artifact.   Diabetes mellitus without complication (HCC)    GERD (gastroesophageal reflux disease)    History of CVA (cerebrovascular accident) 09/25/2020   Hyperlipidemia    Hypertension    Morbid (severe) obesity due to excess calories (HCC) 06/09/2017   Morbid obesity (HCC)    NICM (nonischemic cardiomyopathy) (HCC)    a. 04/2019 Echo: EF 45-50%; b. 02/2019 Echo: EF 55-60%; c. 09/2020 Echo: EF 35-40%, glob HK; d. 09/2020 MV: No ischemia. ? inf infarct vs attenuation; d. 07/2021 Echo: EF 40-45%.   OSA (obstructive sleep apnea)    Pneumonia due to COVID-19 virus 01/30/2020   Stage 3b chronic kidney disease (HCC)    Stroke (HCC) 2024   Syncope and collapse 11/16/2020    Past Surgical History:  Procedure Laterality Date   CARDIAC CATHETERIZATION     CHOLECYSTECTOMY     COLONOSCOPY WITH PROPOFOL  N/A 04/12/2019   Procedure: COLONOSCOPY WITH PROPOFOL ;  Surgeon: Unk Corinn Skiff, MD;  Location: [name] ENDOSCOPY;  Service: Gastroenterology;  Laterality: N/A;   DIALYSIS/PERMA CATHETER INSERTION N/A 03/12/2024   Procedure: DIALYSIS/PERMA CATHETER INSERTION;  Surgeon: Jama Cordella MATSU, MD;  Location: [name];  Service: Cardiovascular;  Laterality: N/A;   ESOPHAGOGASTRODUODENOSCOPY N/A 04/12/2019   Procedure: ESOPHAGOGASTRODUODENOSCOPY (EGD);  Surgeon: Unk Corinn Skiff, MD;  Location: [facility] ENDOSCOPY;  Service: Gastroenterology;  Laterality: N/A;   ESOPHAGOGASTRODUODENOSCOPY (EGD) WITH PROPOFOL  N/A 11/21/2019   Procedure: ESOPHAGOGASTRODUODENOSCOPY (EGD) WITH PROPOFOL ;  Surgeon: Unk Corinn Skiff, MD;  Location: [name] ENDOSCOPY;  Service: Gastroenterology;  Laterality: N/A;   ESOPHAGOGASTRODUODENOSCOPY (EGD) WITH PROPOFOL  N/A 01/11/2022   Procedure: ESOPHAGOGASTRODUODENOSCOPY (EGD) WITH  PROPOFOL ;  Surgeon: Jinny Carmine, MD;  Location: [name] ENDOSCOPY;  Service: Endoscopy;  Laterality: N/A;   ESOPHAGOGASTRODUODENOSCOPY (EGD) WITH PROPOFOL  N/A 08/02/2022   Procedure: ESOPHAGOGASTRODUODENOSCOPY (EGD) WITH PROPOFOL ;  Surgeon: Jinny Carmine, MD;  Location: [name] ENDOSCOPY;  Service: Endoscopy;  Laterality: N/A;   FLEXIBLE BRONCHOSCOPY Bilateral 05/17/2019   Procedure: FLEXIBLE BRONCHOSCOPY;  Surgeon: Fernand Elfreda LABOR, MD;  Location: [name] ORS;  Service: Pulmonary;  Laterality: Bilateral;   left arm surgery     nephrectomy Left    PARATHYROIDECTOMY     RIGHT HEART CATH N/A 03/29/2019   Procedure: RIGHT HEART CATH;  Surgeon: Perla Evalene PARAS, MD;  Location: [name];  Service: Cardiovascular;  Laterality: N/A;   TEMPORARY DIALYSIS CATHETER N/A 03/07/2024   Procedure: TEMPORARY DIALYSIS CATHETER;  Surgeon: Jama Cordella MATSU, MD;  Location: [name];  Service: Cardiovascular;  Laterality: N/A;    Social History Social History   Tobacco Use   Smoking status: Every Day    Current packs/day: 0.00    Types: Cigarettes    Last attempt to quit: 03/13/2020    Years since quitting: 4.1   Smokeless tobacco: Former    Types: Associate Professor status: Never Used  Substance Use Topics   Alcohol use: No   Drug use: No    Family History Family History  Problem Relation Age of Onset   Diabetes Mother        2003   Lung cancer Father    Diabetes Sister    Hypertension Sister    Heart disease Sister    Cancer Sister    Bone cancer Brother    Prostate cancer Neg Hx    Bladder Cancer Neg Hx    Kidney cancer Neg Hx     Allergies  Allergen Reactions   Penicillins Rash, Dermatitis, Hives and Other (See Comments)    Other reaction(s): HIVES   Codeine  Nausea And Vomiting     REVIEW OF SYSTEMS (Negative unless checked)  Constitutional: [] Weight loss  [] Fever  [] Chills Cardiac: [] Chest pain   [] Chest pressure   [] Palpitations   [] Shortness of breath  when laying flat   [] Shortness of breath with exertion. Vascular:  [] Pain in legs with walking   [] Pain in legs at rest  [] History of DVT   [] Phlebitis   [] Swelling in legs   [] Varicose veins   [] Non-healing ulcers Pulmonary:   [] Uses home oxygen    [] Productive cough   [] Hemoptysis   [] Wheeze  [] COPD   [] Asthma Neurologic:  [] Dizziness   [] Seizures   [] History of stroke   [] History of TIA  [] Aphasia   [] Vissual changes   [] Weakness or numbness in arm   [] Weakness or numbness in leg Musculoskeletal:   [] Joint swelling   [] Joint pain   [] Low back pain Hematologic:  [] Easy bruising  [] Easy bleeding   [] Hypercoagulable state   [] Anemic Gastrointestinal:  [] Diarrhea   [] Vomiting  [] Gastroesophageal reflux/heartburn   [] Difficulty swallowing. Genitourinary:  [x] Chronic kidney disease   [] Difficult urination  [] Frequent urination   [] Blood in urine Skin:  [] Rashes   [] Ulcers  Psychological:  [] History  of anxiety   []  History of major depression.  Physical Examination  There were no vitals filed for this visit. There is no height or weight on file to calculate BMI. Gen: WD/WN, NAD Head: [place]/AT, No temporalis wasting.  Ear/Nose/Throat: Hearing grossly intact, nares w/o erythema or drainage Eyes: PER, EOMI, sclera nonicteric.  Neck: Supple, no gross masses or lesions.  No JVD.  Pulmonary:  Good air movement, no audible wheezing, no use of accessory muscles.  Cardiac: RRR, precordium non-hyperdynamic. Vascular:   *** Vessel Right Left  Radial Palpable Palpable  Brachial Palpable Palpable  Gastrointestinal: soft, non-distended. No guarding/no peritoneal signs.  Musculoskeletal: M/S 5/5 throughout.  No deformity.  Neurologic: CN 2-12 intact. Pain and light touch intact in extremities.  Symmetrical.  Speech is fluent. Motor exam as listed above. Psychiatric: Judgment intact, Mood & affect appropriate for pt's clinical situation. Dermatologic: No rashes or ulcers noted.  No changes consistent with  cellulitis.   CBC Lab Results  Component Value Date   WBC 7.4 03/12/2024   HGB 7.6 (L) 03/12/2024   HCT 24.8 (L) 03/12/2024   MCV 94.7 03/12/2024   PLT 174 03/12/2024    BMET    Component Value Date/Time   NA 138 03/12/2024 0523   NA 136 07/15/2021 1151   NA 138 03/06/2013 0621   K 3.9 03/12/2024 0523   K 3.9 03/06/2013 0621   CL 102 03/12/2024 0523   CL 107 03/06/2013 0621   CO2 26 03/12/2024 0523   CO2 23 03/06/2013 0621   GLUCOSE 120 (H) 03/12/2024 0523   GLUCOSE 157 (H) 03/06/2013 0621   BUN 49 (H) 03/12/2024 0523   BUN 41 (H) 07/15/2021 1151   BUN 19 (H) 03/06/2013 0621   CREATININE 3.15 (H) 03/12/2024 0523   CREATININE 1.01 03/06/2013 0621   CALCIUM  8.5 (L) 03/12/2024 0523   CALCIUM  9.3 03/06/2013 0621   GFRNONAA 20 (L) 03/12/2024 0523   GFRNONAA >60 03/06/2013 0621   GFRAA 51 (L) 02/18/2020 1730   GFRAA >60 03/06/2013 0621   CrCl cannot be calculated (Patient's most recent lab result is older than the maximum 21 days allowed.).  COAG Lab Results  Component Value Date   INR 1.8 (H) 11/15/2023   INR 1.4 (H) 09/12/2022   INR 1.7 (H) 07/31/2022    Radiology No results found.   Assessment/Plan There are no diagnoses linked to this encounter.   Cordella Shawl, MD  05/13/2024 12:43 PM

## 2024-05-14 ENCOUNTER — Emergency Department
Admission: EM | Admit: 2024-05-14 | Discharge: 2024-05-14 | Disposition: A | Discharge status: Skilled Nursing Facility | Source: Skilled Nursing Facility | Attending: Emergency Medicine | Admitting: Emergency Medicine

## 2024-05-14 ENCOUNTER — Emergency Department

## 2024-05-14 DIAGNOSIS — Z992 Dependence on renal dialysis: Secondary | ICD-10-CM | POA: Insufficient documentation

## 2024-05-14 DIAGNOSIS — E1122 Type 2 diabetes mellitus with diabetic chronic kidney disease: Secondary | ICD-10-CM | POA: Insufficient documentation

## 2024-05-14 DIAGNOSIS — N186 End stage renal disease: Secondary | ICD-10-CM | POA: Diagnosis not present

## 2024-05-14 DIAGNOSIS — R7989 Other specified abnormal findings of blood chemistry: Secondary | ICD-10-CM | POA: Insufficient documentation

## 2024-05-14 DIAGNOSIS — J449 Chronic obstructive pulmonary disease, unspecified: Secondary | ICD-10-CM | POA: Insufficient documentation

## 2024-05-14 DIAGNOSIS — I4892 Unspecified atrial flutter: Secondary | ICD-10-CM | POA: Insufficient documentation

## 2024-05-14 DIAGNOSIS — I5020 Unspecified systolic (congestive) heart failure: Secondary | ICD-10-CM | POA: Insufficient documentation

## 2024-05-14 DIAGNOSIS — Z7901 Long term (current) use of anticoagulants: Secondary | ICD-10-CM | POA: Diagnosis not present

## 2024-05-14 DIAGNOSIS — I2510 Atherosclerotic heart disease of native coronary artery without angina pectoris: Secondary | ICD-10-CM | POA: Insufficient documentation

## 2024-05-14 DIAGNOSIS — Z8673 Personal history of transient ischemic attack (TIA), and cerebral infarction without residual deficits: Secondary | ICD-10-CM | POA: Insufficient documentation

## 2024-05-14 DIAGNOSIS — J069 Acute upper respiratory infection, unspecified: Secondary | ICD-10-CM | POA: Insufficient documentation

## 2024-05-14 DIAGNOSIS — R0981 Nasal congestion: Secondary | ICD-10-CM | POA: Diagnosis present

## 2024-05-14 LAB — BASIC METABOLIC PANEL WITH GFR
Anion gap: 17 — ABNORMAL HIGH (ref 5–15)
BUN: 115 mg/dL — ABNORMAL HIGH (ref 8–23)
CO2: 19 mmol/L — ABNORMAL LOW (ref 22–32)
Calcium: 8.7 mg/dL — ABNORMAL LOW (ref 8.9–10.3)
Chloride: 101 mmol/L (ref 98–111)
Creatinine, Ser: 5.94 mg/dL — ABNORMAL HIGH (ref 0.61–1.24)
GFR, Estimated: 10 mL/min — ABNORMAL LOW (ref >60)
Glucose, Bld: 169 mg/dL — ABNORMAL HIGH (ref 70–99)
Potassium: 4.2 mmol/L (ref 3.5–5.1)
Sodium: 137 mmol/L (ref 135–145)

## 2024-05-14 LAB — CBC WITH DIFFERENTIAL/PLATELET
Abs Immature Granulocytes: 0.01 K/uL (ref 0.00–0.07)
Basophils Absolute: 0 K/uL (ref 0.0–0.1)
Basophils Relative: 1 %
Eosinophils Absolute: 0 K/uL (ref 0.0–0.5)
Eosinophils Relative: 0 %
HCT: 31.2 % — ABNORMAL LOW (ref 39.0–52.0)
Hemoglobin: 10.2 g/dL — ABNORMAL LOW (ref 13.0–17.0)
Immature Granulocytes: 0 %
Lymphocytes Relative: 18 %
Lymphs Abs: 0.8 K/uL (ref 0.7–4.0)
MCH: 29.6 pg (ref 26.0–34.0)
MCHC: 32.7 g/dL (ref 30.0–36.0)
MCV: 90.4 fL (ref 80.0–100.0)
Monocytes Absolute: 0.4 K/uL (ref 0.1–1.0)
Monocytes Relative: 8 %
Neutro Abs: 3.1 K/uL (ref 1.7–7.7)
Neutrophils Relative %: 73 %
Platelets: 104 K/uL — ABNORMAL LOW (ref 150–400)
RBC: 3.45 MIL/uL — ABNORMAL LOW (ref 4.22–5.81)
RDW: 14.8 % (ref 11.5–15.5)
WBC: 4.2 K/uL (ref 4.0–10.5)
nRBC: 0 % (ref 0.0–0.2)

## 2024-05-14 LAB — RESP PANEL BY RT-PCR (RSV, FLU A&B, COVID)  RVPGX2
Influenza A by PCR: NEGATIVE
Influenza B by PCR: NEGATIVE
Resp Syncytial Virus by PCR: NEGATIVE
SARS Coronavirus 2 by RT PCR: NEGATIVE

## 2024-05-14 LAB — GROUP A STREP BY PCR: Group A Strep by PCR: NOT DETECTED

## 2024-05-14 MED ORDER — DEXAMETHASONE SOD PHOSPHATE PF 10 MG/ML IJ SOLN
10.0000 mg | Freq: Once | INTRAMUSCULAR | Status: AC
  Administered 2024-05-14: 10 mg via INTRAVENOUS

## 2024-05-14 MED ORDER — ACETAMINOPHEN 500 MG PO TABS
1000.0000 mg | ORAL_TABLET | Freq: Once | ORAL | Status: AC
  Administered 2024-05-14: 1000 mg via ORAL
  Filled 2024-05-14: qty 2

## 2024-05-14 MED ORDER — LIDOCAINE VISCOUS HCL 2 % MT SOLN: 15.0000 mL | OROMUCOSAL | 0 refills | Status: DC | PRN

## 2024-05-14 MED ORDER — LIDOCAINE VISCOUS HCL 2 % MT SOLN
15.0000 mL | Freq: Once | OROMUCOSAL | Status: AC
  Administered 2024-05-14: 15 mL via OROMUCOSAL
  Filled 2024-05-14: qty 15

## 2024-05-14 NOTE — ED Triage Notes (Signed)
 Pt presents to the ED via [facility] from [facility] for congestion, cough, and sore throat since Sunday. Pt has been receiving cough medicine at facility. Pt is a dialysis patient. Pt went yesterday and only received 30 minutes of his treatment.   98.1 CBG 207 159/85 HR 70 100% RA

## 2024-05-14 NOTE — Discharge Instructions (Signed)
 Reece tested negative for flu, COVID, RSV and strep throat.  At this point it looks like he can wait until tomorrow to go to dialysis but he needs to actually go  Use Tylenol  for pain and fevers.  Up to 1000 mg per dose, up to 4 times per day.  Do not take more than 4000 mg of Tylenol /acetaminophen  within 24 hours..  Lidocaine  solution by mouth, 15 mL, every 3-4 hours as needed  Go to dialysis tomorrow (Wednesday) even if you are feeling badly

## 2024-05-14 NOTE — ED Provider Notes (Signed)
 [facility] Provider Note    Event Date/Time   First MD Initiated Contact with Patient 05/14/24 [phone]     (approximate)   History   Nasal Congestion   HPI  Hurman Ketelsen Clayson is a 68 y.o. male who presents to the ED for evaluation of Nasal Congestion   Review of medical DC summary from 9/30.  CHF with mildly reduced EF, atrial flutter on Eliquis , ESRD, DM, COPD, CAD, stroke Hemodialysis via right tunneled catheter Mon, Tues, Wed and Fri  Patient presents to the ED from local SNF for evaluation of sore throat and nasal congestion.  Symptoms for the past 2-3 days.  Reports he was feeling so badly yesterday that he did not finish a full dialysis session and estimates being attached for less than 1 hour.  Refused to go to dialysis today and instead presented to the ED because of a odynophagia   Physical Exam   Triage Vital Signs: ED Triage Vitals  Encounter Vitals Group     BP      Girls Systolic BP Percentile      Girls Diastolic BP Percentile      Boys Systolic BP Percentile      Boys Diastolic BP Percentile      Pulse      Resp      Temp      Temp src      SpO2      Weight      Height      Head Circumference      Peak Flow      Pain Score      Pain Loc      Pain Education      Exclude from Growth Chart     Most recent vital signs: Vitals:   05/14/24 0827 05/14/24 0830  BP:  (!) 153/78  Pulse: 70 74  Resp: 18 12  Temp:  97.9 F (36.6 C)  SpO2: 100% 100%    General: Awake, no distress.  Speaking with a normal voice.  Mildly erythematous posterior oropharynx, uvula midline.  Able to swallow water with mild discomfort CV:  Good peripheral perfusion.  Resp:  Normal effort.  Abd:  No distention.  MSK:  No deformity noted.  Neuro:  No focal deficits appreciated. Other:     ED Results / Procedures / Treatments   Labs (all labs ordered are listed, but only abnormal results are displayed) Labs Reviewed  CBC WITH  DIFFERENTIAL/PLATELET - Abnormal; Notable for the following components:      Result Value   RBC 3.45 (*)    Hemoglobin 10.2 (*)    HCT 31.2 (*)    Platelets 104 (*)    All other components within normal limits  BASIC METABOLIC PANEL WITH GFR - Abnormal; Notable for the following components:   CO2 19 (*)    Glucose, Bld 169 (*)    BUN 115 (*)    Creatinine, Ser 5.94 (*)    Calcium  8.7 (*)    GFR, Estimated 10 (*)    Anion gap 17 (*)    All other components within normal limits  GROUP A STREP BY PCR  RESP PANEL BY RT-PCR (RSV, FLU A&B, COVID)  [id number]    EKG Sinus rhythm with a rate of 73 bpm.  Normal axis and intervals.  No clear signs of acute ischemia  RADIOLOGY CXR interpreted by me without evidence of acute cardiopulmonary pathology.  Official radiology report(s): DG Chest  Portable 1 View Result Date: 05/14/2024 EXAM: 1 VIEW(S) XRAY OF THE CHEST 05/14/2024 08:42:00 AM COMPARISON: 03/04/2024 CLINICAL HISTORY: cough, congestion FINDINGS: LINES, TUBES AND DEVICES: A right internal jugular dialysis catheter is now noted with the distal tip in the expected position of the superior vena cava (SVC). LUNGS AND PLEURA: No focal pulmonary opacity. No pleural effusion. No pneumothorax. HEART AND MEDIASTINUM: Stable cardiomediastinal silhouette. BONES AND SOFT TISSUES: No acute osseous abnormality. IMPRESSION: 1. No acute cardiopulmonary process. Electronically signed by: Lynwood Seip MD 05/14/2024 08:49 AM EST RP Workstation: [id number]    PROCEDURES and INTERVENTIONS:  Procedures  Medications  dexamethasone  (DECADRON ) injection 10 mg (has no administration in time range)  lidocaine  (XYLOCAINE ) 2 % viscous mouth solution 15 mL (15 mLs Mouth/Throat Given 05/14/24 0900)  acetaminophen  (TYLENOL ) tablet 1,000 mg (1,000 mg Oral Given 05/14/24 0858)     IMPRESSION / MDM / ASSESSMENT AND PLAN / ED COURSE  I reviewed the triage vital signs and the nursing notes.  Differential diagnosis  includes, but is not limited to, URI, PTA, sepsis, hyperkalemia, upper airway obstruction  {Patient presents with symptoms of an acute illness or injury that is potentially life-threatening.  Dialysis patient presents with signs of viral URI suitable for outpatient management.  No emergent indications for hemodialysis though elevated BUN is noted.  He is alert, oriented.  Normal potassium.  No indications for antibiotics, normal WBC, clear CXR.  Negative viral swabs  and strep swab.  High suspicion for viral URI.  Provide steroids and viscous lidocaine  with improving symptoms.  No barriers to outpatient management at this point.  Urged the patient to actually go to dialysis tomorrow  Clinical Course as of 05/14/24 0947  Tue May 14, 2024  0940 Reassessed and discussed workup and plan of care.  He was supposed to go to dialysis this morning at 830. [DS]  [id number] I discuss with Dr. Marcelino, should be fine to have him return to SNF and get dialyzed tomorrow. No indications to dialyze here today [DS]    Clinical Course User Index [DS] Claudene Rover, MD     FINAL CLINICAL IMPRESSION(S) / ED DIAGNOSES   Final diagnoses:  Viral URI with cough     Rx / DC Orders   ED Discharge Orders          Ordered    lidocaine  (XYLOCAINE ) 2 % solution  As needed        05/14/24 0943             Note:  This document was prepared using Dragon voice recognition software and may include unintentional dictation errors.   Claudene Rover, MD 05/14/24 [phone]

## 2024-05-14 NOTE — ED Notes (Signed)
 [facility] here to pick patient up. Pt verbalizes understanding of discharge instructions. Opportunity for questioning and answers were provided. Pt discharged from ED to [facility].

## 2024-05-14 NOTE — ED Notes (Signed)
 Called [facility]. Sonny is going to check on transportation for patient and give this RN a call back

## 2024-06-30 NOTE — Progress Notes (Unsigned)
 "                     MRN : [id number]  Larry Callahan is a 69 y.o. ([birth year]) male who presents with chief complaint of check access.  History of Present Illness:   The patient is seen for evaluation for dialysis access. The patient has chronic renal insufficiency stage V secondary to hypertension. The patient's most recent creatinine clearance is less than 20.  The most recent BUN and creatinine is dated June 24, 2024 BUN/Cr 101/6.2.  The patient's volume status has not yet become an issue. Patient's blood pressures been relatively well controlled. There are mild uremic symptoms which appear to be relatively well tolerated at this time.  Patient is status post placement of a temporary dialysis catheter March 07, 2024.  Subsequently, a tunneled dialysis catheter was placed in the right IJ position but this was not performed at [facility].  The patient notes the kidney problem has been present for a long time and has been progressively getting worse.  The patient is followed by nephrology.    The patient is right-handed.  The patient has been considering the various methods of dialysis and wishes to proceed with hemodialysis and therefore creation of AV access is indicated.  No recent shortening of the patient's walking distance or new symptoms consistent with claudication.  No history of rest pain symptoms. No new ulcers or wounds of the lower extremities have occurred.  The patient denies amaurosis fugax or recent TIA symptoms. There are no recent neurological changes noted. There is no history of DVT, PE or superficial thrombophlebitis. No recent episodes of angina or shortness of breath documented.   Active Medications[1]  Past Medical History:  Diagnosis Date   Allergy    Atrial flutter (HCC)    a. Dx 12/2020--CHA2DS2VASc = 5-->eliquis .   Chronic combined systolic (congestive) and diastolic (congestive) heart failure (HCC)    a. 04/2019 Echo: EF 45-50%; b. 02/2019  Echo: EF 55-60%; c. 09/2020 Echo: EF 35-40%, glob HK. Nl RV size/fxn. Mild MR; d. 07/2021 Echo: EF 40-45%, mild LVH, low-nl RV fxn, no MR.   CKD (chronic kidney disease), stage III (HCC)    COPD (chronic obstructive pulmonary disease) (HCC)    Coronary artery disease    a. 2011 Cath: nonobs dzs; b. 09/2020 MV: EF 38%. No signif ischemia. ? inf MI vs diaph attenuation. GI uptake artifact.   Diabetes mellitus without complication (HCC)    GERD (gastroesophageal reflux disease)    History of CVA (cerebrovascular accident) 09/25/2020   Hyperlipidemia    Hypertension    Morbid (severe) obesity due to excess calories (HCC) 06/09/2017   Morbid obesity (HCC)    NICM (nonischemic cardiomyopathy) (HCC)    a. 04/2019 Echo: EF 45-50%; b. 02/2019 Echo: EF 55-60%; c. 09/2020 Echo: EF 35-40%, glob HK; d. 09/2020 MV: No ischemia. ? inf infarct vs attenuation; d. 07/2021 Echo: EF 40-45%.   OSA (obstructive sleep apnea)    Pneumonia due to COVID-19 virus 01/30/2020   Stage 3b chronic kidney disease (HCC)    Stroke (HCC) 2024   Syncope and collapse 11/16/2020    Past Surgical History:  Procedure Laterality Date   CARDIAC CATHETERIZATION     CHOLECYSTECTOMY     COLONOSCOPY WITH PROPOFOL  N/A 04/12/2019   Procedure: COLONOSCOPY WITH PROPOFOL ;  Surgeon: Unk Corinn Skiff, MD;  Location: [name] ENDOSCOPY;  Service: Gastroenterology;  Laterality: N/A;   DIALYSIS/PERMA CATHETER INSERTION N/A 03/12/2024   Procedure:  DIALYSIS/PERMA CATHETER INSERTION;  Surgeon: Jama Cordella MATSU, MD;  Location: [name];  Service: Cardiovascular;  Laterality: N/A;   ESOPHAGOGASTRODUODENOSCOPY N/A 04/12/2019   Procedure: ESOPHAGOGASTRODUODENOSCOPY (EGD);  Surgeon: Unk Corinn Skiff, MD;  Location: [facility] ENDOSCOPY;  Service: Gastroenterology;  Laterality: N/A;   ESOPHAGOGASTRODUODENOSCOPY (EGD) WITH PROPOFOL  N/A 11/21/2019   Procedure: ESOPHAGOGASTRODUODENOSCOPY (EGD) WITH PROPOFOL ;  Surgeon: Unk Corinn Skiff, MD;   Location: [name] ENDOSCOPY;  Service: Gastroenterology;  Laterality: N/A;   ESOPHAGOGASTRODUODENOSCOPY (EGD) WITH PROPOFOL  N/A 01/11/2022   Procedure: ESOPHAGOGASTRODUODENOSCOPY (EGD) WITH PROPOFOL ;  Surgeon: Jinny Carmine, MD;  Location: [name] ENDOSCOPY;  Service: Endoscopy;  Laterality: N/A;   ESOPHAGOGASTRODUODENOSCOPY (EGD) WITH PROPOFOL  N/A 08/02/2022   Procedure: ESOPHAGOGASTRODUODENOSCOPY (EGD) WITH PROPOFOL ;  Surgeon: Jinny Carmine, MD;  Location: [name] ENDOSCOPY;  Service: Endoscopy;  Laterality: N/A;   FLEXIBLE BRONCHOSCOPY Bilateral 05/17/2019   Procedure: FLEXIBLE BRONCHOSCOPY;  Surgeon: Fernand Elfreda LABOR, MD;  Location: [name] ORS;  Service: Pulmonary;  Laterality: Bilateral;   left arm surgery     nephrectomy Left    PARATHYROIDECTOMY     RIGHT HEART CATH N/A 03/29/2019   Procedure: RIGHT HEART CATH;  Surgeon: Perla Evalene PARAS, MD;  Location: [name];  Service: Cardiovascular;  Laterality: N/A;   TEMPORARY DIALYSIS CATHETER N/A 03/07/2024   Procedure: TEMPORARY DIALYSIS CATHETER;  Surgeon: Jama Cordella MATSU, MD;  Location: [name];  Service: Cardiovascular;  Laterality: N/A;    Social History Social History[2]  Family History Family History  Problem Relation Age of Onset   Diabetes Mother        2003   Lung cancer Father    Diabetes Sister    Hypertension Sister    Heart disease Sister    Cancer Sister    Bone cancer Brother    Prostate cancer Neg Hx    Bladder Cancer Neg Hx    Kidney cancer Neg Hx     Allergies[3]   REVIEW OF SYSTEMS (Negative unless checked)  Constitutional: [] Weight loss  [] Fever  [] Chills Cardiac: [] Chest pain   [] Chest pressure   [] Palpitations   [] Shortness of breath when laying flat   [] Shortness of breath with exertion. Vascular:  [] Pain in legs with walking   [] Pain in legs at rest  [] History of DVT   [] Phlebitis   [] Swelling in legs   [] Varicose veins   [] Non-healing ulcers Pulmonary:   [] Uses home oxygen     [] Productive cough   [] Hemoptysis   [] Wheeze  [] COPD   [] Asthma Neurologic:  [] Dizziness   [] Seizures   [] History of stroke   [] History of TIA  [] Aphasia   [] Vissual changes   [] Weakness or numbness in arm   [] Weakness or numbness in leg Musculoskeletal:   [] Joint swelling   [] Joint pain   [] Low back pain Hematologic:  [] Easy bruising  [] Easy bleeding   [] Hypercoagulable state   [] Anemic Gastrointestinal:  [] Diarrhea   [] Vomiting  [] Gastroesophageal reflux/heartburn   [] Difficulty swallowing. Genitourinary:  [x] Chronic kidney disease   [] Difficult urination  [] Frequent urination   [] Blood in urine Skin:  [] Rashes   [] Ulcers  Psychological:  [] History of anxiety   []  History of major depression.  Physical Examination  There were no vitals filed for this visit. There is no height or weight on file to calculate BMI. Gen: WD/WN, NAD Head: [place]/AT, No temporalis wasting.  Ear/Nose/Throat: Hearing grossly intact, nares w/o erythema or drainage Eyes: PER, EOMI, sclera nonicteric.  Neck: Supple, no gross masses or lesions.  No JVD.  Pulmonary:  Good air movement, no audible wheezing, no use of accessory muscles.  Cardiac: RRR, precordium non-hyperdynamic. Vascular:   *** Vessel Right Left  Radial Palpable Palpable  Brachial Palpable Palpable  Gastrointestinal: soft, non-distended. No guarding/no peritoneal signs.  Musculoskeletal: M/S 5/5 throughout.  No deformity.  Neurologic: CN 2-12 intact. Pain and light touch intact in extremities.  Symmetrical.  Speech is fluent. Motor exam as listed above. Psychiatric: Judgment intact, Mood & affect appropriate for pt's clinical situation. Dermatologic: No rashes or ulcers noted.  No changes consistent with cellulitis.   CBC Lab Results  Component Value Date   WBC 4.2 05/14/2024   HGB 10.2 (L) 05/14/2024   HCT 31.2 (L) 05/14/2024   MCV 90.4 05/14/2024   PLT 104 (L) 05/14/2024    BMET    Component Value Date/Time   NA 137 05/14/2024 0825   NA  136 07/15/2021 1151   NA 138 03/06/2013 0621   K 4.2 05/14/2024 0825   K 3.9 03/06/2013 0621   CL 101 05/14/2024 0825   CL 107 03/06/2013 0621   CO2 19 (L) 05/14/2024 0825   CO2 23 03/06/2013 0621   GLUCOSE 169 (H) 05/14/2024 0825   GLUCOSE 157 (H) 03/06/2013 0621   BUN 115 (H) 05/14/2024 0825   BUN 41 (H) 07/15/2021 1151   BUN 19 (H) 03/06/2013 0621   CREATININE 5.94 (H) 05/14/2024 0825   CREATININE 1.01 03/06/2013 0621   CALCIUM  8.7 (L) 05/14/2024 0825   CALCIUM  9.3 03/06/2013 0621   GFRNONAA 10 (L) 05/14/2024 0825   GFRNONAA >60 03/06/2013 0621   GFRAA 51 (L) 02/18/2020 1730   GFRAA >60 03/06/2013 0621   CrCl cannot be calculated (Patient's most recent lab result is older than the maximum 21 days allowed.).  COAG Lab Results  Component Value Date   INR 1.8 (H) 11/15/2023   INR 1.4 (H) 09/12/2022   INR 1.7 (H) 07/31/2022    Radiology No results found.   Assessment/Plan There are no diagnoses linked to this encounter.   Cordella Shawl, MD  06/30/2024 1:46 PM      [1]  No outpatient medications have been marked as taking for the 07/01/24 encounter (Appointment) with Shawl, Cordella MATSU, MD.  [2]  Social History Tobacco Use   Smoking status: Every Day    Current packs/day: 0.00    Types: Cigarettes    Last attempt to quit: 03/13/2020    Years since quitting: 4.3   Smokeless tobacco: Former    Types: Engineer, Drilling   Vaping status: Never Used  Substance Use Topics   Alcohol use: No   Drug use: No  [3]  Allergies Allergen Reactions   Penicillins Rash, Dermatitis, Hives and Other (See Comments)    Other reaction(s): HIVES   Codeine  Nausea And Vomiting   "

## 2024-07-01 ENCOUNTER — Encounter (INDEPENDENT_AMBULATORY_CARE_PROVIDER_SITE_OTHER): Payer: Self-pay | Admitting: Vascular Surgery

## 2024-07-01 ENCOUNTER — Ambulatory Visit (INDEPENDENT_AMBULATORY_CARE_PROVIDER_SITE_OTHER): Admitting: Vascular Surgery

## 2024-07-01 ENCOUNTER — Ambulatory Visit (INDEPENDENT_AMBULATORY_CARE_PROVIDER_SITE_OTHER)

## 2024-07-01 VITALS — BP 166/87 | HR 65 | Resp 18 | Ht 71.0 in

## 2024-07-01 DIAGNOSIS — I10 Essential (primary) hypertension: Secondary | ICD-10-CM

## 2024-07-01 DIAGNOSIS — N186 End stage renal disease: Secondary | ICD-10-CM

## 2024-07-01 DIAGNOSIS — J438 Other emphysema: Secondary | ICD-10-CM | POA: Diagnosis not present

## 2024-07-01 DIAGNOSIS — I2510 Atherosclerotic heart disease of native coronary artery without angina pectoris: Secondary | ICD-10-CM

## 2024-07-01 DIAGNOSIS — I739 Peripheral vascular disease, unspecified: Secondary | ICD-10-CM | POA: Diagnosis not present

## 2024-07-16 ENCOUNTER — Telehealth (INDEPENDENT_AMBULATORY_CARE_PROVIDER_SITE_OTHER): Payer: Self-pay

## 2024-07-16 NOTE — Telephone Encounter (Signed)
 Spoke with Berwyn and the patient is scheduled with Dr. Jama on 02/820/26 for a right brachial cephalic fistula at [facility]. Pre-admit will call to schedule pre-op  at the [facility]. Pre-surgical instructions were discussed and will be faxed to attn: [facility] at [facility].

## 2024-07-26 ENCOUNTER — Other Ambulatory Visit

## 2024-08-02 ENCOUNTER — Ambulatory Visit: Admission: RE | Admit: 2024-08-02 | Admitting: Vascular Surgery

## 2024-08-02 ENCOUNTER — Encounter: Admission: RE | Payer: Self-pay
